# Patient Record
Sex: Female | Born: 1965 | Race: White | Hispanic: No | Marital: Married | Smoking: Current every day smoker
Health system: Southern US, Community
[De-identification: ages and names within clinical notes are randomized; demographics above are authoritative.]

## PROBLEM LIST (undated history)

## (undated) DIAGNOSIS — M549 Dorsalgia, unspecified: Secondary | ICD-10-CM

## (undated) DIAGNOSIS — T884XXA Failed or difficult intubation, initial encounter: Secondary | ICD-10-CM

## (undated) DIAGNOSIS — T82898A Other specified complication of vascular prosthetic devices, implants and grafts, initial encounter: Secondary | ICD-10-CM

## (undated) DIAGNOSIS — G8929 Other chronic pain: Secondary | ICD-10-CM

## (undated) DIAGNOSIS — I739 Peripheral vascular disease, unspecified: Secondary | ICD-10-CM

## (undated) DIAGNOSIS — I2510 Atherosclerotic heart disease of native coronary artery without angina pectoris: Secondary | ICD-10-CM

## (undated) DIAGNOSIS — Z8489 Family history of other specified conditions: Secondary | ICD-10-CM

## (undated) DIAGNOSIS — I779 Disorder of arteries and arterioles, unspecified: Secondary | ICD-10-CM

## (undated) DIAGNOSIS — I480 Paroxysmal atrial fibrillation: Secondary | ICD-10-CM

## (undated) DIAGNOSIS — M1990 Unspecified osteoarthritis, unspecified site: Secondary | ICD-10-CM

## (undated) DIAGNOSIS — E785 Hyperlipidemia, unspecified: Secondary | ICD-10-CM

## (undated) DIAGNOSIS — R9439 Abnormal result of other cardiovascular function study: Secondary | ICD-10-CM

## (undated) DIAGNOSIS — E1140 Type 2 diabetes mellitus with diabetic neuropathy, unspecified: Secondary | ICD-10-CM

## (undated) DIAGNOSIS — F32A Depression, unspecified: Secondary | ICD-10-CM

## (undated) DIAGNOSIS — IMO0002 Reserved for concepts with insufficient information to code with codable children: Secondary | ICD-10-CM

## (undated) DIAGNOSIS — I214 Non-ST elevation (NSTEMI) myocardial infarction: Secondary | ICD-10-CM

## (undated) DIAGNOSIS — Z9289 Personal history of other medical treatment: Secondary | ICD-10-CM

## (undated) DIAGNOSIS — I255 Ischemic cardiomyopathy: Secondary | ICD-10-CM

## (undated) DIAGNOSIS — K219 Gastro-esophageal reflux disease without esophagitis: Secondary | ICD-10-CM

## (undated) DIAGNOSIS — F329 Major depressive disorder, single episode, unspecified: Secondary | ICD-10-CM

## (undated) DIAGNOSIS — M869 Osteomyelitis, unspecified: Secondary | ICD-10-CM

## (undated) DIAGNOSIS — N289 Disorder of kidney and ureter, unspecified: Secondary | ICD-10-CM

## (undated) HISTORY — DX: Other chronic pain: G89.29

## (undated) HISTORY — DX: Unspecified osteoarthritis, unspecified site: M19.90

## (undated) HISTORY — PX: TONSILLECTOMY: SUR1361

## (undated) HISTORY — DX: Peripheral vascular disease, unspecified: I73.9

## (undated) HISTORY — PX: CHOLECYSTECTOMY: SHX55

## (undated) HISTORY — DX: Non-ST elevation (NSTEMI) myocardial infarction: I21.4

## (undated) HISTORY — PX: CYSTECTOMY: SUR359

## (undated) HISTORY — DX: Ischemic cardiomyopathy: I25.5

## (undated) HISTORY — PX: CARDIAC CATHETERIZATION: SHX172

## (undated) HISTORY — DX: Disorder of arteries and arterioles, unspecified: I77.9

## (undated) HISTORY — DX: Reserved for concepts with insufficient information to code with codable children: IMO0002

## (undated) HISTORY — DX: Dorsalgia, unspecified: M54.9

## (undated) HISTORY — DX: Osteomyelitis, unspecified: M86.9

## (undated) HISTORY — DX: Major depressive disorder, single episode, unspecified: F32.9

## (undated) HISTORY — DX: Depression, unspecified: F32.A

## (undated) HISTORY — PX: SPINE SURGERY: SHX786

## (undated) HISTORY — PX: OTHER SURGICAL HISTORY: SHX169

## (undated) HISTORY — DX: Hyperlipidemia, unspecified: E78.5

## (undated) HISTORY — PX: BACK SURGERY: SHX140

---

## 2001-03-23 ENCOUNTER — Encounter: Payer: Self-pay | Admitting: Family Medicine

## 2001-03-23 ENCOUNTER — Ambulatory Visit (HOSPITAL_COMMUNITY): Admission: RE | Admit: 2001-03-23 | Discharge: 2001-03-23 | Payer: Self-pay | Admitting: Family Medicine

## 2003-07-06 ENCOUNTER — Encounter: Payer: Self-pay | Admitting: Orthopedic Surgery

## 2003-07-06 ENCOUNTER — Ambulatory Visit: Admission: RE | Admit: 2003-07-06 | Discharge: 2003-07-06 | Payer: Self-pay | Admitting: Orthopedic Surgery

## 2003-07-14 ENCOUNTER — Encounter: Payer: Self-pay | Admitting: Orthopedic Surgery

## 2003-07-14 ENCOUNTER — Ambulatory Visit (HOSPITAL_COMMUNITY): Admission: RE | Admit: 2003-07-14 | Discharge: 2003-07-14 | Payer: Self-pay | Admitting: Orthopedic Surgery

## 2003-09-14 ENCOUNTER — Ambulatory Visit (HOSPITAL_COMMUNITY): Admission: RE | Admit: 2003-09-14 | Discharge: 2003-09-14 | Payer: Self-pay | Admitting: Internal Medicine

## 2004-05-22 ENCOUNTER — Ambulatory Visit (HOSPITAL_COMMUNITY): Admission: RE | Admit: 2004-05-22 | Discharge: 2004-05-22 | Payer: Self-pay | Admitting: Internal Medicine

## 2004-07-25 ENCOUNTER — Encounter (HOSPITAL_COMMUNITY): Admission: RE | Admit: 2004-07-25 | Discharge: 2004-08-17 | Payer: Self-pay | Admitting: Neurosurgery

## 2004-08-20 ENCOUNTER — Encounter (HOSPITAL_COMMUNITY): Admission: RE | Admit: 2004-08-20 | Discharge: 2004-09-19 | Payer: Self-pay | Admitting: General Surgery

## 2004-09-20 ENCOUNTER — Inpatient Hospital Stay (HOSPITAL_COMMUNITY): Admission: RE | Admit: 2004-09-20 | Discharge: 2004-09-23 | Payer: Self-pay | Admitting: Neurosurgery

## 2004-10-02 ENCOUNTER — Ambulatory Visit (HOSPITAL_COMMUNITY): Admission: RE | Admit: 2004-10-02 | Discharge: 2004-10-02 | Payer: Self-pay | Admitting: Neurosurgery

## 2004-11-19 ENCOUNTER — Ambulatory Visit: Payer: Self-pay | Admitting: Orthopedic Surgery

## 2004-11-26 ENCOUNTER — Ambulatory Visit: Payer: Self-pay | Admitting: Orthopedic Surgery

## 2004-11-30 ENCOUNTER — Ambulatory Visit (HOSPITAL_COMMUNITY): Admission: RE | Admit: 2004-11-30 | Discharge: 2004-11-30 | Payer: Self-pay | Admitting: Orthopedic Surgery

## 2004-12-06 ENCOUNTER — Ambulatory Visit: Payer: Self-pay | Admitting: Orthopedic Surgery

## 2004-12-11 ENCOUNTER — Ambulatory Visit (HOSPITAL_COMMUNITY): Admission: RE | Admit: 2004-12-11 | Discharge: 2004-12-11 | Payer: Self-pay | Admitting: Orthopedic Surgery

## 2005-01-25 ENCOUNTER — Ambulatory Visit (HOSPITAL_COMMUNITY): Admission: RE | Admit: 2005-01-25 | Discharge: 2005-01-25 | Payer: Self-pay | Admitting: Neurosurgery

## 2005-02-04 ENCOUNTER — Ambulatory Visit: Payer: Self-pay | Admitting: Orthopedic Surgery

## 2005-02-15 ENCOUNTER — Ambulatory Visit (HOSPITAL_COMMUNITY): Admission: RE | Admit: 2005-02-15 | Discharge: 2005-02-15 | Payer: Self-pay | Admitting: Orthopedic Surgery

## 2005-02-15 ENCOUNTER — Ambulatory Visit: Payer: Self-pay | Admitting: Orthopedic Surgery

## 2005-02-18 ENCOUNTER — Ambulatory Visit: Payer: Self-pay | Admitting: Orthopedic Surgery

## 2005-03-06 ENCOUNTER — Ambulatory Visit: Payer: Self-pay | Admitting: Orthopedic Surgery

## 2005-03-20 ENCOUNTER — Ambulatory Visit: Payer: Self-pay | Admitting: Orthopedic Surgery

## 2005-05-14 ENCOUNTER — Other Ambulatory Visit: Admission: RE | Admit: 2005-05-14 | Discharge: 2005-05-14 | Payer: Self-pay | Admitting: *Deleted

## 2005-05-27 ENCOUNTER — Ambulatory Visit (HOSPITAL_COMMUNITY): Admission: RE | Admit: 2005-05-27 | Discharge: 2005-05-27 | Payer: Self-pay | Admitting: *Deleted

## 2005-06-24 ENCOUNTER — Emergency Department (HOSPITAL_COMMUNITY): Admission: EM | Admit: 2005-06-24 | Discharge: 2005-06-24 | Payer: Self-pay | Admitting: Emergency Medicine

## 2007-01-07 ENCOUNTER — Emergency Department (HOSPITAL_COMMUNITY): Admission: EM | Admit: 2007-01-07 | Discharge: 2007-01-07 | Payer: Self-pay | Admitting: Emergency Medicine

## 2007-09-14 ENCOUNTER — Ambulatory Visit (HOSPITAL_COMMUNITY): Admission: RE | Admit: 2007-09-14 | Discharge: 2007-09-14 | Payer: Self-pay | Admitting: Internal Medicine

## 2007-09-15 ENCOUNTER — Ambulatory Visit (HOSPITAL_COMMUNITY): Admission: RE | Admit: 2007-09-15 | Discharge: 2007-09-15 | Payer: Self-pay | Admitting: Internal Medicine

## 2007-12-08 ENCOUNTER — Encounter (INDEPENDENT_AMBULATORY_CARE_PROVIDER_SITE_OTHER): Payer: Self-pay | Admitting: Orthopedic Surgery

## 2007-12-08 ENCOUNTER — Inpatient Hospital Stay (HOSPITAL_COMMUNITY): Admission: RE | Admit: 2007-12-08 | Discharge: 2007-12-11 | Payer: Self-pay | Admitting: Orthopedic Surgery

## 2008-02-24 ENCOUNTER — Ambulatory Visit (HOSPITAL_COMMUNITY): Admission: RE | Admit: 2008-02-24 | Discharge: 2008-02-24 | Payer: Self-pay | Admitting: Internal Medicine

## 2008-03-01 ENCOUNTER — Ambulatory Visit (HOSPITAL_COMMUNITY): Admission: RE | Admit: 2008-03-01 | Discharge: 2008-03-01 | Payer: Self-pay | Admitting: Internal Medicine

## 2009-01-06 ENCOUNTER — Ambulatory Visit (HOSPITAL_COMMUNITY): Admission: RE | Admit: 2009-01-06 | Discharge: 2009-01-06 | Payer: Self-pay | Admitting: Internal Medicine

## 2009-01-06 ENCOUNTER — Encounter: Payer: Self-pay | Admitting: Orthopedic Surgery

## 2009-02-27 ENCOUNTER — Ambulatory Visit: Payer: Self-pay | Admitting: Orthopedic Surgery

## 2009-02-27 DIAGNOSIS — M7150 Other bursitis, not elsewhere classified, unspecified site: Secondary | ICD-10-CM

## 2009-02-27 DIAGNOSIS — M7500 Adhesive capsulitis of unspecified shoulder: Secondary | ICD-10-CM | POA: Insufficient documentation

## 2009-03-01 ENCOUNTER — Encounter: Payer: Self-pay | Admitting: Orthopedic Surgery

## 2009-03-22 ENCOUNTER — Encounter (HOSPITAL_COMMUNITY): Admission: RE | Admit: 2009-03-22 | Discharge: 2009-04-21 | Payer: Self-pay | Admitting: Orthopedic Surgery

## 2009-04-03 ENCOUNTER — Ambulatory Visit: Payer: Self-pay | Admitting: Orthopedic Surgery

## 2009-04-13 ENCOUNTER — Encounter: Payer: Self-pay | Admitting: Orthopedic Surgery

## 2009-05-11 ENCOUNTER — Ambulatory Visit (HOSPITAL_COMMUNITY): Admission: RE | Admit: 2009-05-11 | Discharge: 2009-05-11 | Payer: Self-pay | Admitting: Internal Medicine

## 2010-04-23 ENCOUNTER — Ambulatory Visit: Payer: Self-pay | Admitting: Surgery

## 2010-05-10 ENCOUNTER — Ambulatory Visit (HOSPITAL_COMMUNITY): Admission: RE | Admit: 2010-05-10 | Discharge: 2010-05-10 | Payer: Self-pay | Admitting: Surgery

## 2010-05-10 ENCOUNTER — Ambulatory Visit: Payer: Self-pay | Admitting: Surgery

## 2010-06-11 ENCOUNTER — Ambulatory Visit: Payer: Self-pay | Admitting: Surgery

## 2010-07-05 ENCOUNTER — Ambulatory Visit: Payer: Self-pay | Admitting: Surgery

## 2010-07-05 ENCOUNTER — Encounter: Payer: Self-pay | Admitting: Surgery

## 2010-07-05 ENCOUNTER — Inpatient Hospital Stay (HOSPITAL_COMMUNITY): Admission: RE | Admit: 2010-07-05 | Discharge: 2010-07-08 | Payer: Self-pay | Admitting: Surgery

## 2010-07-05 HISTORY — PX: OTHER SURGICAL HISTORY: SHX169

## 2010-07-30 ENCOUNTER — Ambulatory Visit: Payer: Self-pay | Admitting: Surgery

## 2010-10-16 ENCOUNTER — Ambulatory Visit: Payer: Self-pay | Admitting: Surgery

## 2010-11-05 ENCOUNTER — Ambulatory Visit: Payer: Self-pay | Admitting: Vascular Surgery

## 2010-11-18 HISTORY — PX: SKIN GRAFT: SHX250

## 2010-12-09 ENCOUNTER — Encounter: Payer: Self-pay | Admitting: Internal Medicine

## 2010-12-18 ENCOUNTER — Ambulatory Visit: Payer: Self-pay | Admitting: Surgery

## 2010-12-21 NOTE — Procedures (Unsigned)
BYPASS GRAFT EVALUATION  INDICATION:  Followup right femoral popliteal bypass graft.  HISTORY: Diabetes:  Yes. Cardiac:  No. Hypertension:  No. Smoking:  Yes. Previous Surgery:  Right common femoral artery to below knee popliteal bypass graft with saphenous vein done 07/05/2010.  SINGLE LEVEL ARTERIAL EXAM                              RIGHT              LEFT Brachial:                    110                80 Anterior tibial:             103                61 Posterior tibial:            89                 59 Peroneal: Ankle/brachial index:        0.94               0.55  PREVIOUS ABI:  Date:  07/30/2010  RIGHT:  0.74  LEFT:  0.51  LOWER EXTREMITY BYPASS GRAFT DUPLEX EXAM:  DUPLEX:  Patent right femoral-popliteal bypass graft with mildly broadened biphasic and monophasic waveforms.  There was no evidence of increased velocities visualized.  IMPRESSION:  Stable ankle brachial indices.  A patent femoral-popliteal bypass graft as described above.  There is a pressure gradient difference within the brachial arteries, the right being greater than the left.        ___________________________________________ Judeth Cornfield. Scot Dock, M.D.  OD/MEDQ  D:  12/14/2010  T:  12/14/2010  Job:  [id number]

## 2011-01-31 LAB — COMPREHENSIVE METABOLIC PANEL
ALT: 27 U/L (ref 0–35)
Alkaline Phosphatase: 116 U/L (ref 39–117)
BUN: 9 mg/dL (ref 6–23)
CO2: 30 mEq/L (ref 19–32)
Chloride: 95 mEq/L — ABNORMAL LOW (ref 96–112)
GFR calc non Af Amer: >60 mL/min (ref >60)
Glucose, Bld: 328 mg/dL — ABNORMAL HIGH (ref 70–99)
Potassium: 3.8 mEq/L (ref 3.5–5.1)
Sodium: 135 mEq/L (ref 135–145)
Total Bilirubin: 0.3 mg/dL (ref 0.3–1.2)

## 2011-01-31 LAB — GLUCOSE, CAPILLARY
Glucose-Capillary: 148 mg/dL — ABNORMAL HIGH (ref 70–99)
Glucose-Capillary: 170 mg/dL — ABNORMAL HIGH (ref 70–99)
Glucose-Capillary: 239 mg/dL — ABNORMAL HIGH (ref 70–99)
Glucose-Capillary: 272 mg/dL — ABNORMAL HIGH (ref 70–99)
Glucose-Capillary: 275 mg/dL — ABNORMAL HIGH (ref 70–99)
Glucose-Capillary: 277 mg/dL — ABNORMAL HIGH (ref 70–99)

## 2011-01-31 LAB — CBC
HCT: 51.8 % — ABNORMAL HIGH (ref 36.0–46.0)
Hemoglobin: 18 g/dL — ABNORMAL HIGH (ref 12.0–15.0)
MCHC: 34.6 g/dL (ref 30.0–36.0)
MCV: 92.3 fL (ref 78.0–100.0)
Platelets: 187 10*3/uL (ref 150–400)
RDW: 13.9 % (ref 11.5–15.5)
RDW: 14 % (ref 11.5–15.5)
WBC: 9.6 10*3/uL (ref 4.0–10.5)

## 2011-01-31 LAB — SURGICAL PCR SCREEN
MRSA, PCR: NEGATIVE
Staphylococcus aureus: NEGATIVE

## 2011-01-31 LAB — PROTIME-INR
INR: 0.94 (ref 0.00–1.49)
Prothrombin Time: 12.8 seconds (ref 11.6–15.2)

## 2011-01-31 LAB — URINALYSIS, ROUTINE W REFLEX MICROSCOPIC
Bilirubin Urine: NEGATIVE
Glucose, UA: >1000 mg/dL — AB
Hgb urine dipstick: NEGATIVE
Protein, ur: NEGATIVE mg/dL
Urobilinogen, UA: 0.2 mg/dL (ref 0.0–1.0)

## 2011-01-31 LAB — BASIC METABOLIC PANEL
BUN: 3 mg/dL — ABNORMAL LOW (ref 6–23)
CO2: 32 mEq/L (ref 19–32)
Calcium: 8.5 mg/dL (ref 8.4–10.5)
Calcium: 8.7 mg/dL (ref 8.4–10.5)
Creatinine, Ser: 0.45 mg/dL (ref 0.4–1.2)
Creatinine, Ser: 0.54 mg/dL (ref 0.4–1.2)
GFR calc Af Amer: >60 mL/min (ref >60)
GFR calc non Af Amer: >60 mL/min (ref >60)
Glucose, Bld: 283 mg/dL — ABNORMAL HIGH (ref 70–99)

## 2011-01-31 LAB — URINE MICROSCOPIC-ADD ON

## 2011-01-31 LAB — TYPE AND SCREEN

## 2011-02-03 LAB — POCT I-STAT, CHEM 8
BUN: 13 mg/dL (ref 6–23)
Calcium, Ion: 1.04 mmol/L — ABNORMAL LOW (ref 1.12–1.32)
TCO2: 27 mmol/L (ref 0–100)

## 2011-02-03 LAB — GLUCOSE, CAPILLARY: Glucose-Capillary: 217 mg/dL — ABNORMAL HIGH (ref 70–99)

## 2011-02-14 ENCOUNTER — Emergency Department (HOSPITAL_COMMUNITY): Payer: Medicare Other

## 2011-02-14 ENCOUNTER — Encounter (HOSPITAL_COMMUNITY): Payer: Self-pay | Admitting: Radiology

## 2011-02-14 ENCOUNTER — Emergency Department (HOSPITAL_COMMUNITY)
Admission: EM | Admit: 2011-02-14 | Discharge: 2011-02-14 | Disposition: A | Discharge status: Home / Self Care | Payer: Medicare Other | Attending: Emergency Medicine | Admitting: Emergency Medicine

## 2011-02-14 DIAGNOSIS — J3489 Other specified disorders of nose and nasal sinuses: Secondary | ICD-10-CM | POA: Insufficient documentation

## 2011-02-14 DIAGNOSIS — R42 Dizziness and giddiness: Secondary | ICD-10-CM | POA: Insufficient documentation

## 2011-02-25 LAB — CREATININE, SERUM
Creatinine, Ser: 0.59 mg/dL (ref 0.4–1.2)
GFR calc non Af Amer: >60 mL/min (ref >60)

## 2011-04-02 NOTE — Assessment & Plan Note (Signed)
OFFICE VISIT   [name], [name]  DOB:  August 25, 1966                                       07/30/2010  CHART#:[id number]   [name] returns today.  She is status post right femoral to below knee  popliteal bypass graft with reversed ipsilateral vein.  She had slight  separation of one of the vein harvest sites which she has placed a  butterfly dressing on, otherwise she is doing well.  She has some  swelling.  The leg pain is adequately controlled.   On physical exam, her incisions are all healed with the exception of one  of her vein harvest sites, which she just has an eschar over it.  There  is no evidence of erythema.  There is no evidence of infection.  There  is mild lymphatic drainage from the apex of it but there is no skin  opening.  There is a mild amount of edema in the foot.   Her ABI has increased on the right to now 0.74.  Preoperatively it was  0.5.   PLAN:  I will see the patient back in 3 months with a repeat ultrasound  to scan entire graft.  In the interim she has any problems she knows to  contact me.     Eldridge Abrahams, MD  Electronically Signed   VWB/MEDQ  D:  07/30/2010  T:  07/31/2010  Job:  [id number]

## 2011-04-02 NOTE — H&P (Signed)
NAMEKEIDRA, DOOSE                ACCOUNT NO.:  [id number]   MEDICAL RECORD NO.:  [id number]          PATIENT TYPE:  INP   LOCATION:  NA                           FACILITY:  [facility]   PHYSICIAN:  [name], P.A.  DATE OF BIRTH:  1965/12/02   DATE OF ADMISSION:  DATE OF DISCHARGE:                              HISTORY & PHYSICAL   CHIEF COMPLAINT:  Lumbar pain status post fusion at L4-5 with left leg  pain.   HISTORY OF PRESENT ILLNESS:  Diabetes, asthma, arthritis.   ALLERGIES:  PENICILLIN.   MEDICATIONS:  Albuterol, glipizide, hydrocodone, diazepam, Lyrica.   PAST SURGICAL HISTORY:  1. A right toe surgery.  2. Lumbar fusion at L4-5 with inner body fusion.  3. Left wrist surgery.   FAMILY HISTORY:  Mother has a history of hypertension, coronary artery  disease, diabetes, gout and mental illness.   REVIEW OF SYSTEMS:  The patient reports no fever, chills.  No weight  loss.  No bowel or bladder incontinence.  No hemoptysis.  No seizures.  No headaches.  No chronic cough.  No shortness of breath.   PHYSICAL EXAMINATION:  VITAL SIGNS:  Temperature today is 98.7, pulse  90, respirations 18, blood pressure 125/83, saturation 96% on room air.  HEENT:  Pupils are round, equal, reactive to light.  NECK:  Supple.  No adenopathy.  CHEST:  Clear to auscultation.  No wheezing.  HEART:  Regular rate and rhythm.  No murmur.  ABDOMEN:  Soft, nontender to palpation.  Positive bowel sounds on  auscultation.  EXTREMITIES:  No focal weaknesses noted.  She has hypersensitivity down  the left leg and all dermatomes compared to the right distally.  NEUROLOGICAL:  Motor intact.  She has tenderness to palpation at L5-S1  posterior lumbar.  SKIN:  Clean, dry and intact.  No rashes.  No __________  in the skin.   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   PLAN:  Lumbar fusion L5-S1, hardware removal of existing hardware.  New  pedicle screws and rods will be placed  from L4-S1 with inner body fusion  at L5-S1 by Dr. Dimas Alexandria.      [name], P.A.     MC/MEDQ  D:  12/02/2007  T:  12/02/2007  Job:  [id number]

## 2011-04-02 NOTE — Op Note (Signed)
[name], [name]                ACCOUNT NO.:  [id number]   MEDICAL RECORD NO.:  [id number]          PATIENT TYPE:  INP   LOCATION:  5004                         FACILITY:  [facility]   PHYSICIAN:  Dimas Alexandria, MD      DATE OF BIRTH:  1966/01/13   DATE OF PROCEDURE:  12/08/2007  DATE OF DISCHARGE:                               OPERATIVE REPORT   SURGEON:  Dr. Dimas Alexandria.   ASSISTANT:  [name], PA-C.   PREOPERATIVE DIAGNOSIS:  Central disk herniation L5-S1 status post  previous L4-5 posterior interbody fusion.   POSTOPERATIVE DIAGNOSIS:  Central disk herniation L5-S1 status post  previous L4-5 posterior interbody fusion.   OPERATIVE PROCEDURE:  Removal of previously placed pedicle screws at L4-  5; posterolateral and posterior interbody fusion (TLIF) L5-S1; harvest  of posterior iliac crest graft right side; insertion of bilateral S1  pedicle screws and reinsertion of bilateral L5 pedicle screws; insertion  of interbody cage L5-S1 through the left transforaminal approach.   OPERATIVE NOTE:  The patient was placed under general endotracheal  anesthesia.  Prophylactic antibiotics had been infused.  A Foley  catheter was placed in the bladder.  Sequential compression devices were  placed on both lower extremities.   The patient was positioned prone on a Jackson frame.  Care was taken to  position the upper extremities so as to avoid hyperflexion and abduction  of the shoulders.  Both upper extremities were supported on foam from  axilla to hands.  Care was taken to avoid hyperflexion of the elbows.  There was no pressure on the cubital tunnels.  The hips were mildly  flexed and the thighs, knees, shins and ankles supported on pillows.   The previous midline incision was marked with a skin marker and a  proposed site of bone graft harvest was marked on the right side with a  skin marker just lateral to the iliac crest.  The lumbar area was  prepped with DuraPrep and  draped in a rectangular fashion.  The drapes  were secured with Ioban.   The skin was scored creating a small ellipse the full length of the  previous midline incision.  The subcutaneous tissue was injected with a  mixture of 0.25% plain Marcaine and 1% lidocaine with epinephrine.  The  scar was then excised using cutting current.  Dissection was carried  down onto what proved to be the spinous process of L5 and more  proximally L3.  The paraspinal muscle and scar was mobilized bilaterally  to reveal the pedicle screws and rods in place at L4-5.  The set screws  were removed from the saddles, the rods removed and then the screws  removed without difficulty.   I next exposed the ala of the sacrum on the left and the transverse  process of L5 on the left.  I then did an inferior facetectomy on the  left of L5.  We then developed a plane between the deep surface of the  superior tracheal process and the ligamentum flavum and joint capsule  using an angled curette.  The  superior articular facet was then removed  with a Kerrison rongeur.  The epidural veins and the neural foramen were  bipolar coagulated.  The bipolar coagulated tissue was then mobilized to  reveal the disk deep to it and mobilization carried out medially to the  origins of the S1 nerve root and dura.  We then placed a oblong  cottonoid superior to the L5-S1 disk, interposed between it and the  exiting L5 nerve root on the left side.   Next we created a pedicle hole at the S1 while directly visualizing and  palpating the S1 pedicle.  The pedicle hole was started with a high-  speed bur, an awl used to further perforate the pedicle and a 3.5-mm  drill bit used to create a hole through the pedicle into the body of S1.  The hole was carefully palpated with a ball-tip probe and was  circumferentially intact and it was sounded for depth.  A 7.5 mm screw  of appropriate length was then chosen.  The hole was tapped with a 6.5  mm  tap which was stimulated and the current necessary to elicit distal  EMG activity was above the critical level, in a safe the zone.  The  screw was then inserted into the S1 pedicle on the left side.  We then  drilled the bottom out of the L5 hole, measured it and placed another  7.5 mm screw in this hole, which had previously contained a 6.5 mm  screw.  This screw was also stimulated as was the S1 screw and the  current necessary to elicit distal EMG activity was in a safe zone.   We then placed a working rod on the left at L5-S1 and distracted the  construct and held  it with set screws.  This created improved ability  to work within the L5-S1 disk space through the transforaminal approach.  We incised the disk and removed a piece of annulus.  Subsequently we  curetted the disk with multiple different curettes and removed a large  quantity of degenerate looking nucleus with pituitary rongeurs. The  endplates were vigorously curetted.   Next we harvested a bone graft from the right posterior iliac crest.  A  slightly oblique incision was made lateral to the crest, just proximal  to the PSIS. The gluteal fascia was incised along its origin from the  iliac crest and the muscle elevated off the ilium.  I then used gouges  to harvest a moderately large quantity of cortical cancellous graft and  cancellous graft.  We then irrigated the wound and packed the bony  defect with Gelfoam.  The gluteal fascia was reapposed to the iliac  crest using interrupted figure-of-eight #1 Vicryl sutures.   The graft was then morselized.   We also at this point placed a pedicle screw at S1 on the right side,  identifying the base of the superior articular process by removing a  small portion of the inferior articular process of L5 on the right. The  hole was perforated with an awl and a 3.5 mm drill bit used to make a  hole through the pedicle into the vertebral body. We then tapped the  hole with a 7.5 mm  tap. The tap was stimulated and the stimulation of  distal EMG activity occurred above the critical level, meaning that is  highly unlikely there is any contact between the metal and the nerve  root. The bottom was drilled from the L5 hole  on the right side and 7.5  mm screw of appropriate length inserted at L5.   We then returned our attention to the left side.  An intradiskal  distractor was placed, the set screws of the working rod loosened and  further distraction obtained and the set screws tightened.   A special funnel was placed in the disk space and a moderate quantity of  morcellized graft impacted into the anterior portion of disk space.   An 11-mm kidney-shaped implant was then loaded with bone graft and  impacted into the disk space in good position transversely and somewhat  anteriorly. There was no spontaneous L5 nerve root firing on the left  side during the impaction of this implant.  We then released the facet  screws to allow compression of the disk space.   Next, I the used a high-speed bur to decorticate the ala of the sacrum  and transverse process of L5 on the left.  A moderately large quantity  of morcellized graft was then packed in posterolaterally on the left.   Next a new rod was placed on the left, another new rod on the right and  the cold construct compressed bilaterally and the set screws torqued.  At this point the cross-table lateral radiograph was taken which was  consistent with satisfactory screw position.   I then used a high-speed bur to remove the articular surfaces of the  right L5-S1 apophyseal joint and to decorticate the lamina of L5 and the  lamina S1 on the right side.  The remaining bone graft was packed in  posteriorly in the facet joint as well as between the lamina of S1 and  L5 on the right.   We then inspected the laminectomy defect for the presence of any graft  and none had migrated into the wound.  FloSeal was injected into  the  laminectomy defect.   A 15 gauge Blake drain was placed subfascially.  The thoracolumbar  fascia was closed using continuous #1 Vicryl suture.  A one-eighth inch  Hemovac drain was placed in the subcutaneous layer.  The subcutaneous  layer was closed using interrupted and continuous 2-0 Vicryl suture over  a one-eighth inch Hemovac drain which was brought out through the bone  graft harvest subcutaneous layer and through the skin to the right and  proximally.  A similar closure of the subcutaneous layer of the bone  graft harvest site was carried out.  Both skin incisions were closed  using a continuous subcuticular 3-0 undyed Vicryl. The skin edges were  reinforced with Steri-Strips.  Both drains were secured with a basket  weave type of #2-0 nylon suture.  Antibiotic dressings were applied and  secured with OpSite.   The estimated blood loss was 200 mL.  There were no intraoperative  complications.  The sponge and needle counts were correct.      Dimas Alexandria, MD  Electronically Signed     MT/MEDQ  D:  12/09/2007  T:  12/09/2007  Job:  [id number]

## 2011-04-02 NOTE — Procedures (Signed)
VASCULAR LAB EXAM   INDICATION:  Preop for lower extremity vascular surgery.   HISTORY:  Diabetes:  Yes.  Cardiac:  No.  Hypertension:  Yes.   EXAM:  Bilateral lower extremity vein mapping.   IMPRESSION:  1. The right greater saphenous vein is compressible with diameter      measurements ranging from 0.23-0.54 cm.  2. The right lesser saphenous vein is compressible with diameter      measurements ranging from 0.16-0.33 cm.  3. The left greater saphenous vein is compressible with diameter      measurements ranging from 0.23-0.58 cm.  4. The left lesser saphenous vein is compressible with diameter      measurements ranging from 0.26-0.28 cm.  5. Additional diameter measurements are noted on the attached      worksheet.      ___________________________________________  V. Leia Alf, MD   CH/MEDQ  D:  06/12/2010  T:  06/12/2010  Job:  [id number]

## 2011-04-02 NOTE — Procedures (Signed)
CAROTID DUPLEX EXAM   INDICATION:  Preoperative for lower extremity vascular surgery.   HISTORY:  Diabetes:  yes  Cardiac:  no  Hypertension:  yes  Smoking:  yes  Previous Surgery:  no  CV History:  Currently asymptomatic  Amaurosis Fugax No, Paresthesias No, Hemiparesis No                                       RIGHT             LEFT  Brachial systolic pressure:         120               85  Brachial Doppler waveforms:         normal            Abnormal  Vertebral direction of flow:        Antegrade         Bidirectional  DUPLEX VELOCITIES (cm/sec)  CCA peak systolic                   96                [id number]  ECA peak systolic                   70                [id number]  ICA peak systolic                   98                77  ICA end diastolic                   34                29  PLAQUE MORPHOLOGY:                  heterogeneous     heterogeneous  PLAQUE AMOUNT:                      mild              mild  PLAQUE LOCATION:                    ICA               ICA / CCA   IMPRESSION:  1. No hemodynamically significant stenosis of the bilateral internal      carotid arteries noted.  2. Bidirectional (mostly retrograde) flow noted in the left vertebral      artery along with asymmetric bilateral  brachial pressures.  3. Visualization of the left proximal subclavian artery was limited      however a dampened monophasic midsubclavian artery waveform is      noted.   ___________________________________________  V. Leia Alf, MD   CH/MEDQ  D:  06/12/2010  T:  06/12/2010  Job:  [id number]

## 2011-04-02 NOTE — Assessment & Plan Note (Signed)
OFFICE VISIT   [name], [name]  DOB:  16-Jul-1966                                       06/11/2010  CHART#:[id number]   REASON FOR VISIT:  Follow up leg pain.   HISTORY:  This is a 45 year old female that I saw initially at the  request of Dr. Gerarda Fraction for bilateral arterial insufficiency as well as a  chronic nonhealing wound on the right leg that had present for  approximately a year.  She went for arteriogram and was found have  bilateral SFA occlusion.  She comes back in today for further  discussions.   The patient continues to be treated medically for her  hypercholesterolemia.  She is an insulin-dependent diabetic.  She has  tried to quit smoking but has yet to stop up.   REVIEW OF SYSTEMS:  As above.  All other review systems are negative as  documented in the encounter form.   PAST MEDICAL HISTORY:  Hypercholesterolemia, diabetes, hypertension,  chronic back pain, asthma, peripheral vascular disease.   FAMILY HISTORY:  Positive for cardiovascular disease in her mother.  Her  mother had heart attack and also underwent a leg bypass.   SOCIAL HISTORY:  She is married.  She currently smokes a pack and half a  day.  No alcohol.   MEDICATIONS:  Lasix, insulin, metformin, glipizide, hydrocodone,  Robaxin, cholesterol medicine, antireflux medicine and Lyrica.   ALLERGIES:  Penicillin.   PHYSICAL EXAMINATION:  Heart rate 92, blood pressure 89/56, temperature  97.9.  GENERAL:  She is well-appearing, in no distress.  HEENT:  Within normal limits.  LUNGS:  Clear bilaterally.  No wheezes or rhonchi.  CARDIOVASCULAR:  Regular rate and rhythm.  ABDOMEN:  Soft, nontender.  MUSCULOSKELETAL:  No major deformity.  NEURO:  She has no focal deficits or weakness.  She has a 1 x 1 cm ulcer on the lateral aspect of her right foot.   Diagnostic studies:  She does have a normal myocardial perfusion imaging  performed by [facility].  ABIs are  0.64 on the right  and 0.59 on the  left and  0-49% stenosis in bilateral carotid arteries.   ASSESSMENT/PLAN:  Bilateral claudication with right leg ulcer.   PLAN:  The patient will be scheduled for a right femoral to above-knee  popliteal bypass graft to be done on Thursday August 18.  I vein mapped  her today.  She has adequate right greater saphenous vein.  All of her  questions were answered today.     Eldridge Abrahams, MD  Electronically Signed   VWB/MEDQ  D:  06/11/2010  T:  06/12/2010  Job:  [id number]

## 2011-04-02 NOTE — Assessment & Plan Note (Signed)
OFFICE VISIT   Jamie, [name]  DOB:  01/03/1966                                       04/23/2010  CHART#:[id number]   HISTORY:  This is a 45 year old female I am seeing at request of Dr.  Gerarda Fraction for evaluation of bilateral arterial insufficiency.  The patient  also has a chronic, nonhealing wound on her right leg that has been  present for greater than 1 year.  She has also developed a new ulcer on  her right heel that has just been present for several days.  She  continues to do dressing changes to her wounds.  The patient has been a  previous patient at [facility] and has been offered  peripheral stenting the patient.  The patient is no longer seen at their  practice.   The patient continues to be a smoker, although she has tried multiple  modalities to quit including Chantix and water vapor cigarettes and  nicotine gum.  She has high cholesterol, for which she is diet-  controlled.  She is a diabetic, on insulin.   REVIEW OF SYSTEMS:  GENERAL:  Positive loss of appetite.  CARDIAC:  Negative.  PULMONARY:  Positive for asthma.  GI:  Negative.  GU:  Negative.  VASCULAR:  Positive for pain in legs when walking and when lying flat.  NEURO:  Negative.  MUSCULOSKELETAL:  Positive for joint pain, muscle pain.  PSYCH:  Positive for depression.  EENT:  Negative.  HEME:  NEGATIVE.  SKIN:  Negative.   PAST MEDICAL HISTORY:  Hypercholesterolemia, diabetes, hypertension,  chronic back pain, asthma, peripheral vascular disease.   FAMILY HISTORY:  Positive for cardiovascular disease in her mother.   SOCIAL HISTORY:  She is married.  Currently smokes a pack to a pack and  a half a day.  No alcohol.   PHYSICAL EXAMINATION:  Vital signs:  Heart rate 95, blood pressure  100/67, respiratory rate 20.  General:  She is well-appearing, in no  distress.  HEENT:  Within normal limits.  Lungs were clear bilaterally  with bilateral wheezes.   Cardiovascular is a regular rate and rhythm.  Abdomen is obese but soft.  Musculoskeletal:  The patient has an ulcer  on the lateral malleolus, approximately 1, cm with drainage, no evidence  of infection.  There is also skin breakdown on the heel, and on the  plantar surface of the foot there is a large, fluid-filled blister.  Neuro:  She has no focal deficits or weaknesses.  Skin:  She has several  punctate lesions on her legs.   DIAGNOSTIC TESTS:  Ultrasound was performed today, which reveals an  ankle-brachial index of 0.5 on the right and 0.64 on the left.  She has  bilateral SFA occlusion.   ASSESSMENT:  Right leg ulcers.   PLAN:  I tried to reiterate to the patient today that she has a myriad  of complaints regarding her lower extremities and I do not feel that  they are all related to arterial insufficiency.  What I am most  concerned about is the ulcer on the right leg that has been there for  greater than a year and has not healed.  I think this is not healing due  to her vascular insufficiency.  The first step in management would be to  proceed with  an arteriogram focusing on the right leg.  I would also get  diagnostic images of the left leg.  If possible, I would proceed with  stenting of her right leg; however, with a known total occlusion she may  require a bypass surgery for revascularization.  I have scheduled her  arteriogram for Thursday, June, 23rd.     Eldridge Abrahams, MD  Electronically Signed   VWB/MEDQ  D:  04/23/2010  T:  04/24/2010  Job:  [id number]   cc:   Dr. Gerarda Fraction

## 2011-04-02 NOTE — Procedures (Signed)
LOWER EXTREMITY ARTERIAL DUPLEX   INDICATION:  PVD.   HISTORY:  Diabetes:  Yes.  Cardiac:  No.  Hypertension:  Yes.  Smoking:  Yes.  Previous Surgery:  No.   SINGLE LEVEL ARTERIAL EXAM                          RIGHT                LEFT  Brachial:               118                  76  Anterior tibial:        59                   76  Posterior tibial:       42                   73  Peroneal:  Ankle/Brachial Index:   0.50                 0.64   LOWER EXTREMITY ARTERIAL DUPLEX EXAM   DUPLEX:  1. Right proximal to mid SFA indicates high velocities of 265 cm/s      with occlusion of the mid SFA.  2. The left SFA is occluded from the proximal to mid artery.   IMPRESSION:  1. The right proximal to mid superficial femoral artery shows      increased velocities of 265 cm with occlusion of mid superficial      femoral artery.  2. Left occlusion of proximal and mid superficial femoral artery.   ___________________________________________  V. Leia Alf, MD   NT/MEDQ  D:  04/23/2010  T:  04/23/2010  Job:  [id number]

## 2011-04-05 ENCOUNTER — Encounter (INDEPENDENT_AMBULATORY_CARE_PROVIDER_SITE_OTHER): Payer: Medicare Other

## 2011-04-05 DIAGNOSIS — I739 Peripheral vascular disease, unspecified: Secondary | ICD-10-CM

## 2011-04-05 DIAGNOSIS — Z48812 Encounter for surgical aftercare following surgery on the circulatory system: Secondary | ICD-10-CM

## 2011-04-05 NOTE — Discharge Summary (Signed)
NAMESHERYE, JANIS                ACCOUNT NO.:  [id number]   MEDICAL RECORD NO.:  [id number]          PATIENT TYPE:  INP   LOCATION:  3004                         FACILITY:  [facility]   PHYSICIAN:  Marchia Meiers. Vertell Limber, M.D.  DATE OF BIRTH:  06-13-1966   DATE OF ADMISSION:  09/20/2004  DATE OF DISCHARGE:  09/23/2004                                 DISCHARGE SUMMARY   REASON FOR ADMISSION:  Lumbar spondylosis with stenosis L4-5 level.   HISTORY OF PRESENT ILLNESS:  Phil Macmillan is a 45 year old woman with back  and leg pain who failed medical management, had a lumbar MRI demonstrating  grade 1 spondylolisthesis and spinal stenosis at the L4-L5 level. She is  admitted to the hospital on same day as procedure basis on September 20, 2004  and underwent decompression and fusion at the affected level.  Postoperatively, she was slow to mobilize but did well and was doing better  on November 6, at which point she was discharged to home in stable and  satisfactory condition having tolerated her operation and hospitalization  well.   DISCHARGE MEDICATIONS:  Percocet and Valium along with Nystatin for oral  thrush.   FINAL DIAGNOSIS:  Lumbar spondylosis with stenosis L4-5 level.   CONDITION ON DISCHARGE:  Improved.      Jose   JDS/MEDQ  D:  09/23/2004  T:  09/23/2004  Job:  [id number]

## 2011-04-05 NOTE — Op Note (Signed)
NAMEMELONIE, WENZLER                ACCOUNT NO.:  [id number]   MEDICAL RECORD NO.:  [id number]          PATIENT TYPE:  AMB   LOCATION:  DAY                           FACILITY:  [facility]   PHYSICIAN:  Carole Civil, M.D.DATE OF BIRTH:  1966-08-11   DATE OF PROCEDURE:  02/15/2005  DATE OF DISCHARGE:                                 OPERATIVE REPORT   HISTORY:  This is a 45 year old female who had a ganglion cyst removed from  her right foot. It recurred.  We aspirated it, got an MRI to rule out  infection. There was no infection, but recurrence of a ganglion cyst. She  presented for removal because of pain and difficulty with shoe wear.   PREOPERATIVE DIAGNOSIS:  Mass, right foot.   POSTOPERATIVE DIAGNOSIS:  Mass ganglion cyst, right foot.   FINDINGS:  Ganglion cyst and chronic granulation tissue, right foot.   PROCEDURE:  Excision of the mass.   SURGEON:  Carole Civil, M.D.   ANESTHETIC:  MAC.   SPECIMEN:  Granulation tissue and ganglion cyst wall.   BLOOD LOSS:  0.   COMPLICATIONS:  None.   COUNTS:  Correct.   The patient went to recovery room in stable condition.   The patient was identified as Maekayla stone in the preop holding area. Her  right foot was marked for surgery, countersigned by the surgeon. The history  and physical was updated and she was given preop antibiotics. She was taken  to the operating room where IV sedation was given along with the local 10 cc  injection of 1% plain lidocaine.  Through the previous incision, the  ganglion was removed.  The chronic granulation tissue was removed.  The  periosteum over the bone was scraped to remove any residual tissue. The  wound was irrigated and closed with  interrupted 3-0 nylon suture. Sterile bandage was applied. The patient was  taken to the recovery room in stable condition.   ADDENDUM:  Prior to dressing the wound another 10 cc of 1% lidocaine was  injected.      SEH/MEDQ  D:  02/15/2005  T:   02/15/2005  Job:  [id number]

## 2011-04-05 NOTE — Op Note (Signed)
[name], [name]                ACCOUNT NO.:  [id number]   MEDICAL RECORD NO.:  [id number]          PATIENT TYPE:  INP   LOCATION:  3004                         FACILITY:  [facility]   PHYSICIAN:  Ophelia Charter, M.D.DATE OF BIRTH:  [birth year]   DATE OF PROCEDURE:  09/20/2004  DATE OF DISCHARGE:                                 OPERATIVE REPORT   BRIEF HISTORY:  The patient is a 45 year old white female who has suffered  from back and leg pain.  She failed medical management and was worked up  with a lumbar MRI which demonstrated the patient had a grade I necrotic  spondylolisthesis with resultant spinal stenosis.  She has failed  nonsurgical management.  I therefore discussed treatment of surgery with  her.  The patient knows the risks, benefits, and alternatives of surgery and  decided to proceed with an L4-5 decompression and fusion.   PREOPERATIVE DIAGNOSES:  1.  L4-5 grade I necrotic spondylolisthesis.  2.  Degenerative disease.  3.  Spinal stenosis.  4.  Lumbar radiculopathy.  5.  Lumbago.   POSTOPERATIVE DIAGNOSES:  1.  L4-5 grade I necrotic spondylolisthesis.  2.  Degenerative disease.  3.  Spinal stenosis.  4.  Lumbar radiculopathy.  5.  Lumbago.   PROCEDURE:  L4 VLGIL procedure; L4-5 posterior lumbar interbody fusion;  placement of bilateral L4-5 interbody prosthesis (Capstone peak cages);  posterior nonsegmental instrumentation with Legacy titanium pedicle screws  and rods; posterolateral arthrodesis with local morselized autograft bone  and Vitoss bone scaffolding.   SURGEON:  Ophelia Charter, M.D.   ASSISTANT:  Otilio Connors, M.D.   ANESTHESIA:  General endotracheal anesthesia.   ESTIMATED BLOOD LOSS:  200 mL.   SPECIMENS:  None.   DRAINS:  None.   COMPLICATIONS:  None.   DESCRIPTION OF PROCEDURE:  The patient was brought to the operating room by  the anesthesia team.  General endotracheal anesthesia was induced.  The  patient was turned to  the prone position on the Wilson frame.  The  lumbosacral region was then prepared with Betadine scrub and Betadine  solution.  Sterile drapes were applied.  I then injected the area to be  incised with Marcaine with epinephrine solution.  I used a scalpel to make a  linear midline incision over the L4-5 interspace.  I used electrocautery to  dissect down to direct lumbar fascia, divided fascia bilaterally and  performed a bilateral subperiosteal dissection exposing the bilateral spinal  stenosis and lamina of L4 and L5.  We then obtained an intraoperative  radiograph to confirm our location.  We inserted a McCullough retractor for  exposure.  I then used the scalpel to incise the L3-4 and L4-5 interspinous  ligament.  We then used the Leksell rongeur to remove the spinous process of  the L4 and part of the L4 lamina.  We saved this bone and later cleared off  soft tissue to use in the fusion process.  We then used high speed drill to  perform bilateral L4 laminotomies.  We completed the L4 laminectomy/Gill  procedures using the Kerrison  punch.  There was considerable lateral recess  stenosis which required additional decompression.  We performed a generous  foraminotomy of the bilateral L4 and L5 nerve root using the Kerrison punch  completing the decompression. We removed the ligamentum flavum at L3-4 and  L4-5 as well.   We now turned our attention to placement of lumbar interbody fusion.  We  carefully retracted the neural structures smoothly with a __________  retractor and incised the L4-5 ruptured disc and performed aggressive  discectomy bilaterally using the pituitary forceps on the skeletal curette.  We then prepared the vertebral end plates  by distracting the contralateral  disc space using LMA spreader and then prepared the disc space using the  curette, freeing off the soft tissue.  We then inserted a 12 x 26 mm  Capstone peak cage which had been prefilled with local  morselized autograft  bone and Vitoss bone scaffolding.  I entered the disc space, of course,  after retracting the neural structures out of harm's way with a D'Errico  retractor.  We then filled medially in the disc space with local morselized  autograft bone and Vitoss bone scaffolding and then placed a second 12 x 26  peak cage in the contralateral disc space including the posterolateral  interbody fusion.   We now turn our attention to instrumentation.  Under fluoroscopic guidance  we cannulated the bilateral L4 and L5 pedicles with our probe.  We tapped  the pedicles, probed inside the tapped pedicles to rule out corticoplegias  and then started with 6.5 x 45 mm __________ screws bilaterally at L4 and  L5.  We then palpated along the medial surface of the L4 and L5 pedicles and  noted there were no cortical breeches and the L4 and L4 nerve roots were not  injured.  We then connected the unilateral pedicle screws with a lordotic  rod and slightly compressed the construct and then secured the rod in place  with caps which were torqued appropriately completing the instrumentation.   We now turned our attention to the posterolateral arthrodesis.  These had to  be drilled, the remainder of the L4-5 facet and pars region as well as the  transverse process.  We laid a combination of local morselized autograft  bone and Vitoss bone scaffolding at these decorticated posterolateral  structures including the posterolateral arthrodesis.  We then inspected the  thecal sac and bilateral L4 and L5 nerve roots.  They were well  decompressed.  We then obtained stringent hemostasis with bipolar cautery.  We irrigated the wound out with Bacitracin solution, removed the solution  and removed the Versatract retractor.  We approximated the patient's  thoracolumbar fascia with interrupted #1 Vicryl suture, subcutaneous tissue with 2-0 Vicryl suture and the skin with Steri-Strips and Benzoin.  The  wound  was then covered with Bacitracin ointment, sterile dressings applied  and drapes were removed.  The patient was subsequently returned to supine  position where she was extubated by anesthesia team and transported to post  anesthesia care unit in stable condition.  All sponge, needle and instrument  counts were correct at the end of the case.      JDJ/MEDQ  D:  09/20/2004  T:  09/21/2004  Job:  [id number]

## 2011-04-05 NOTE — H&P (Signed)
[name], [name]                ACCOUNT NO.:  [id number]   MEDICAL RECORD NO.:  [id number]          PATIENT TYPE:  AMB   LOCATION:  DAY                           FACILITY:  [facility]   PHYSICIAN:  Carole Civil, M.D.DATE OF BIRTH:  March 01, 1966   DATE OF ADMISSION:  DATE OF DISCHARGE:  LH                                HISTORY & PHYSICAL   CHIEF COMPLAINT:  Mass, right foot.   HISTORY:  This is a 45 year old female with a previously resected ganglion  from the right foot.  It came back.  MRI was obtained to check for  infection.  There was none noted, but a recurrence of the ganglia at the  base of the fourth and fifth metatarsal bones of the fourth and fifth  metatarsal bones of the right foot.  Attempt at aspiration on November 09, 2003 removed gelatinous fluid, and then it came back again.  The patient  complains that this interferes with her shoe wearing, and wishes to have it  removed.   ALLERGIES:  None.   PREVIOUS SURGERIES:  None.   MEDICATIONS:  Takes multiple medications including -  1.  Amaryl.  2.  Glipizide.  3.  Prevacid.  4.  Avecor.  5.  Trivora.  6.  Lisinopril.  7.  Crestor.  8.  Hyoscyamine.   PAST MEDICAL HISTORY:  1.  History of GERD,  2.  Reflux.  3.  Joint pain.  4.  Arthritis.  5.  Diabetes.  6.  Allergies.  7.  Sinusitis.  8.  History of pneumonia.   FAMILY HISTORY:  Heart disease, asthma, and arthritis.   SOCIAL HISTORY:  She is a smoker.  She smokes 1-1/2 packs of cigarettes per  day.  Does not drink.  She completed her education through the eighth grade.   PHYSICAL EXAMINATION:  VITAL SIGNS:  Weight 235.  Pulse 76, respiratory rate  20.  GENERAL:  She has normal development, nutrition, grooming, and hygiene.  EXTREMITIES:  No swelling or varicosities.  No temperature changes,  __________, or tenderness.  Pulses are intact.  Right foot shows a mass on  the base of the fourth and fifth metatarsal that is fluctuant.  It contains  fluid, consistent with a ganglion.  There are no neurovascular deficits.  The previous wound healed fine.  NEUROLOGIC:  Neurologic findings were normal.  PSYCHIATRIC:  Psychiatric findings were normal.   IMPRESSION:  Ganglion cyst, right foot, recurrent.   PLAN:  Excision of ganglion cyst, right foot.      SEH/MEDQ  D:  02/14/2005  T:  02/14/2005  Job:  [id number]

## 2011-04-05 NOTE — Discharge Summary (Signed)
[name], SHAHEED                ACCOUNT NO.:  [id number]   MEDICAL RECORD NO.:  [id number]          PATIENT TYPE:  INP   LOCATION:  5004                         FACILITY:  [facility]   PHYSICIAN:  Dimas Alexandria, MD      DATE OF BIRTH:  [birth year]   DATE OF ADMISSION:  12/08/2007  DATE OF DISCHARGE:  12/11/2007                               DISCHARGE SUMMARY   ADMISSION DIAGNOSIS:  Central herniated disk, L5-S1; status post fusion  L4-5.   The patient was admitted on December 08, 2007 by Dr. Dimas Alexandria for  lumbar fusion, L5-S1.  Postoperatively, the patient was admitted to  [facility].  She had blood loss estimated at 200 cc.  She was  neurovascularly motor intact postoperatively.  Postop day 1, the patient  was stable.  Hemoglobin stable at 15.5.  She did have a high glucose at  275 with known diabetes.  Drain output was 175 cc.  Dressing was clean  and dry, no active drainage.  Distally, neurovascularly motor intact.  Calves were soft.  We advanced her diet secondary to flatulence was  passed. Postop day 2, the patient was doing much better.  Left leg pain  was better and decreased hypersensitivity.  Hemoglobin stable at 14.2.  Therapy came and worked with the patient.  We discontinued the 02, the  saturation monitor, PCA, Foley, and encouraged p.o. pain medicines.  On  the 23rd, postop day 2, the patient was stable.  Pain was well  controlled on p.o. medications.  Hemoglobin was stable at 13.2.  She was  afebrile. She was independently moving about her room and ready for  discharge home.  The drains were discontinued.  Dressing was changed, no  active drainage.  The patient was discharged home on p.o. pain  medications to include Norco 10/325, Robaxin 500 mg one or two q. 6.  Asked to followup in our office 4 weeks postoperatively.  Encouraged to  walk for exercise.  No bending, stooping, lifting.   DISCHARGE DIAGNOSIS:  Central herniated disk L5-S1 status post fusion  with previous fusion at L4-5.   DISPOSITION:  Stable.   DIET:  Regular.      [name], P.A.      Dimas Alexandria, MD  Electronically Signed    MC/MEDQ  D:  01/26/2008  T:  01/26/2008  Job:  [id number]   cc:   Dimas Alexandria, MD

## 2011-04-05 NOTE — H&P (Signed)
[name]                ACCOUNT NO.:  [id number]   MEDICAL RECORD NO.:  [id number]          PATIENT TYPE:  AMB   LOCATION:  DAY                           FACILITY:  [facility]   PHYSICIAN:  Carole Civil, M.D.DATE OF BIRTH:  06/07/1966   DATE OF ADMISSION:  DATE OF DISCHARGE:  LH                                HISTORY & PHYSICAL   CHIEF COMPLAINT:  Mass on the right foot.   PAST MEDICAL HISTORY:  This patient has a history of a previously resected  ganglion from the right foot.  It has recurred.  MRI was obtained to check  for infection.  There was none noted, just the recurrence of the ganglion at  the base of the fourth and fifth metatarsal bones of the right foot.  An  attempted aspiration was performed on November 09, 2003 that did remove  gelatinous fluid.  However, it has recurred again.  It is interfering with  her shoe wear and therefore will be removed.  The patient has no allergies,  no other previous surgeries, takes multiple medications including Amaryl,  glipizide, Prevacid, Avacor, Trivora, lisinopril, Crestor, hyoscyamine.  She  has a history of GERD, reflux, joint pain, arthritis, diabetes, allergies,  sinusitis and a history of pneumonia.   FAMILY HISTORY:  Heart disease, asthma and arthritis.   SOCIAL HISTORY:  She is a smoker.  She smokes 1-1/2 packs of cigarettes per  day.  She does not drink.  She completed her education through the 8th  grade.   VITAL SIGNS:  Weight 235, pulse 76, respiratory rate 20.  APPEARANCE:  Normal development, nutrition, grooming, hygiene.  CARDIOVASCULAR:  No swelling or varicosities, temperature changes, edema or  tenderness.  Pulses are intact.  EXTREMITIES:  The right foot shows a mass at the base of the fourth and the  fifth metatarsals that is fluctuant.  It is consistent with a ganglion.  There are no neurovascular deficits.  The wound healed fine.  NEUROLOGIC:  Exam normal.  PSYCHIATRIC:  Exam normal.   IMPRESSION:  Ganglion cyst, right foot, recurrent.   PLAN:  Excision of ganglion cyst, right foot.   The patient is given informed consent.  Risks of the procedure are  infection, recurrence, pain, incisional pain, sensation change around the  incision.  There are also general risks of surgery.  The patient has agreed  to the procedure with its inherent risks.  If the procedure is not done, we  have also discussed that of course the mass would persist and shoe wear  would be difficult.      SEH/MEDQ  D:  12/07/2004  T:  12/07/2004  Job:  [id number]

## 2011-04-05 NOTE — Op Note (Signed)
NAME:  [name], [name]                          ACCOUNT NO.:  [id number]   MEDICAL RECORD NO.:  [id number]                   PATIENT TYPE:  AMB   LOCATION:  DAY                                  FACILITY:  [facility]   PHYSICIAN:  Carole Civil, M.D.           DATE OF BIRTH:  Aug 21, 1966   DATE OF PROCEDURE:  07/14/2003  DATE OF DISCHARGE:                                 OPERATIVE REPORT   HISTORY OF PRESENT ILLNESS:  This is a 45 year old female with a mass on the  right foot causing difficulty with shoe wear.  She has tried aspiration and  injection through Drs. Berline Lopes and Blanch Media and did not improve and presented to  me with continued pain and inability to wear shoes in a normal fashion.  X-  rays were negative for bony lesion.  She presents with a mass, as stated.   INDICATIONS FOR PROCEDURE:  Pain, right foot, with mass and inability to  wear shoes.   PREOPERATIVE DIAGNOSIS:  Mass, right foot.   POSTOPERATIVE DIAGNOSIS:  Mass, right foot.   PROCEDURE:  Excision of mass, right foot.   SURGEON:  Carole Civil, M.D.   ANESTHESIA:  Local with MAC.   TOURNIQUET TIME:  15 minutes.   FINDINGS:  Ganglion-like mass over the right foot down to the periosteum of  the fifth metatarsal.   DESCRIPTION OF PROCEDURE:  The patient was identified as Brewster.  My  initials were placed over the surgical site on the right foot.  The chart  and consent were reviewed to confirm the diagnosis and procedure.  She was  given 1 g of Ancef and taken to the operating room for MAC plus local with  1% lidocaine plain and 0.5% Sensorcaine in a 1:1 mixture, a total of 10 cc.  After sterile prep and drape, the tourniquet was elevated to 250 mmHg.   The skin was incised down to the capsule of the mass.  The mass was sharply  excised and extended down to the periosteum of the fifth metatarsal.  The  gelatinous material was noted and expressed from the lesion.   The wound was irrigated and  closed in layered fashion with 2-0 Vicryl and 3-  0 Monocryl.  Sensorcaine 0.5% plain, 10 cc, was injected into the area of  the skin incision.  Sterile dressings were applied, and the patient's  tourniquet was  released.  The patient was taken to the recovery room in stable condition.  The plan is for her to be full weightbearing.  Follow up next Wednesday.  Discharged on Lorcet Plus for pain.  On followup, will do a dressing change.  A pathology specimen was sent.  Carole Civil, M.D.    SEH/MEDQ  D:  07/14/2003  T:  07/14/2003  Job:  [id number]

## 2011-04-29 NOTE — Procedures (Unsigned)
BYPASS GRAFT EVALUATION  INDICATION:  Follow up peripheral vascular disease.  HISTORY: Diabetes:  Yes Cardiac: Hypertension:  No Smoking:  Currently Previous Surgery:  Right femoral to popliteal bypass graft on 07/05/2010  SINGLE LEVEL ARTERIAL EXAM                              RIGHT              LEFT Brachial:                    98                 73 Anterior tibial:             74                 49 Posterior tibial:            85                 58 Peroneal: Ankle/brachial index:        0.87               0.59  PREVIOUS ABI:  Date: 12/14/2010  RIGHT:  0.94  LEFT:  0.55  LOWER EXTREMITY BYPASS GRAFT DUPLEX EXAM:  DUPLEX:  50% to 75% stenosis in the right proximal anastomosis of the femoral to popliteal bypass graft and native distal popliteal/tibioperoneal trunk arteries.  IMPRESSION: 1. Patent right femoral to popliteal bypass graft. 2. Stenosis present as noted above. 3. Essentially unchanged ankle-brachial indices since previous study     on 12/14/2010.        ___________________________________________ V. Leia Alf, MD  SH/MEDQ  D:  04/05/2011  T:  04/05/2011  Job:  [id number]

## 2011-06-21 ENCOUNTER — Ambulatory Visit (INDEPENDENT_AMBULATORY_CARE_PROVIDER_SITE_OTHER): Payer: Medicare Other | Admitting: Thoracic Diseases

## 2011-06-21 VITALS — BP 103/63 | HR 92 | Temp 98.8°F

## 2011-06-21 DIAGNOSIS — L97409 Non-pressure chronic ulcer of unspecified heel and midfoot with unspecified severity: Secondary | ICD-10-CM

## 2011-06-21 DIAGNOSIS — L97529 Non-pressure chronic ulcer of other part of left foot with unspecified severity: Secondary | ICD-10-CM

## 2011-06-21 DIAGNOSIS — I739 Peripheral vascular disease, unspecified: Secondary | ICD-10-CM

## 2011-06-21 NOTE — Progress Notes (Signed)
[facility] [place] HISTORY AND PHYSICAL -PAD  History of Present Illness  [name] is a 45 y.o. female patient who presents with known left lower extremity PAD. Pt. States she was trying to find a shoe to fit her and the sandle that she bought caused a blister on the lateral aspect of the 5th metatarsal head. She was seen by the wound care center, Dr Hart Carwin who has been treating her for this and referred her to [place]. The pt has known bilat SFA occlusions and underwent a right Fem-POP bypass with GSV by Dr. Trula Slade 1 year ago. She has a small  wound on the lateral malleolus of the  leg which is almost healed. denies claudication in either extremity. The wound on her left leg has become bigger and necrotic. Pt. denies rest pain;and night pain. She states her legs swell but the left foot and ankle has remained more swollen.  Non-Invasive Vascular Imaging done 04/05/11  ABI: RIGHT 0.76;  LEFT 0.50 DUPLEX SCAN OF BYPASS: 50-75% stenosis of right proximal anastomosis  Patent right fem pop bypass   Previous angiogram: Yes with findings of bilat SFA occlusions and 3 vessel runoff on left   ROS: 12 point ROS Negative except as stated in HPI  Past Medical History  Diagnosis Date  . Diabetes mellitus   . PAD (peripheral artery disease)   . Chronic back pain   . HTN (hypertension)   . Asthma   . Hyperlipidemia     History   Social History  . Marital Status: Married    Spouse Name: N/A    Number of Children: N/A  . Years of Education: N/A   Occupational History  . Not on file.   Social History Main Topics  . Smoking status: Smoker, Current Status Unknown -- 1.5 packs/day    Types: Cigarettes  . Smokeless tobacco: Not on file  . Alcohol Use: No  . Drug Use: Not on file  . Sexually Active: Not on file   Other Topics Concern  . Not on file   Social History Narrative  . No narrative on file    Family History  Problem Relation Age of Onset  .  Coronary artery disease Mother   . Peripheral vascular disease Mother     Meds  Pt also takes Glipizide, lyrica TID and Robaxin but did not know the doses of these meds  Lasix 20mg  TID prn swelling Current outpatient prescriptions:aspirin 325 MG tablet, Take 325 mg by mouth daily.  , Disp: , Rfl: ;  insulin glargine (LANTUS) 100 UNIT/ML injection, Inject 15 Units into the skin at bedtime.  , Disp: , Rfl: ;  metFORMIN (GLUCOPHAGE) 1000 MG tablet, Take 1,000 mg by mouth 2 (two) times daily with a meal.  , Disp: , Rfl:   Physical Examination  Filed Vitals:   06/21/11 1743  BP: 103/63  Pulse: 92  Temp: 98.8 F (37.1 C)    General: A&O x 3, WDWN female in NAD Eyes: PERRLA,  Pulmonary:  CTAB, Negative  Rales, Negative rhonchi, & Negative wheezing,  Cardiac: regular Rythm ,  Negative Murmurs,  Negative  rubs or gallops Gastrointestinal: soft, NTND,  Musculoskeletal:Strength 5/5 all extremities with good motion Skin of left lower extremity  with quarter size necrotic ulcer  of lateral aspect of left foot. It did not appear to go down to bone. There was no bone exposed .  Extremities without ischemic changes except  left lower extremity with  Gangrene of lateral aspect of left foot; with cellulitis to the mid foot ; with open wounds; without drainage.  [name] lateral malleolus wound is size of a dime and granulating in nicely  Vascular:     RIGHT   LEFT         LOWER EXTREMITY PULSES             RIGHT             LEFT    FEMORAL palpable palpable            DORSALIS PEDIS palpable monophasic by Doppler         PERONEAL  weak and monophasic by Doppler    Neurologic: A&O X 3; Appropriate Affect ; SENSATION ;normal; MOTOR FUNCTION: normal 5/5 strength in all tested muscle groups   ASSESSMENT: [name] is a 45 y.o. female who presents with: left upper PAD WITH non healing, necrotic ulcer on the lateral aspect of the left foot .  Pt has known SFA occlusion bilat and is S/P  right femoral to popliteal bypass with vein.  PLAN:  Pt was placed on Cipro 500mg  BID and will return to clinic on Monday 8/6 to be seen by Dr Trula Slade with ABI's for further evaluation and decisions regarding appropriate treatment.  Case discussed with Dr. Bridgett Larsson  Attending MD:  Adele Barthel, MD

## 2011-06-24 ENCOUNTER — Encounter (INDEPENDENT_AMBULATORY_CARE_PROVIDER_SITE_OTHER): Payer: Medicare Other

## 2011-06-24 ENCOUNTER — Ambulatory Visit (INDEPENDENT_AMBULATORY_CARE_PROVIDER_SITE_OTHER): Payer: Medicare Other | Admitting: Surgery

## 2011-06-24 ENCOUNTER — Encounter: Payer: Self-pay | Admitting: Surgery

## 2011-06-24 VITALS — BP 82/54 | HR 97 | Temp 97.9°F | Ht 66.0 in | Wt 170.0 lb

## 2011-06-24 DIAGNOSIS — Z48812 Encounter for surgical aftercare following surgery on the circulatory system: Secondary | ICD-10-CM

## 2011-06-24 DIAGNOSIS — L98499 Non-pressure chronic ulcer of skin of other sites with unspecified severity: Secondary | ICD-10-CM

## 2011-06-24 DIAGNOSIS — E1159 Type 2 diabetes mellitus with other circulatory complications: Secondary | ICD-10-CM

## 2011-06-24 DIAGNOSIS — I739 Peripheral vascular disease, unspecified: Secondary | ICD-10-CM

## 2011-06-24 DIAGNOSIS — G589 Mononeuropathy, unspecified: Secondary | ICD-10-CM

## 2011-06-24 DIAGNOSIS — G629 Polyneuropathy, unspecified: Secondary | ICD-10-CM

## 2011-06-24 NOTE — Progress Notes (Signed)
Subjective:     Patient ID: [name], female   DOB: [birth year], 45 y.o.   MRN: [id number]  HPI the patient is a 45 year old female well known to me having undergone right femoral to below knee popliteal artery bypass graft with vein on 07/06/2003 limbs salvage in the setting of a nonhealing wound. She was recently seen by Heron Sabins in the office with a new issue of a left foot wound. This is being followed by Dr. Nils Pyle at the wound center. It has been present for approximately 6 weeks. She feels that as a result of having worn a shoe that did not fit very well. The patient has known SFA occlusion on the left. She has been on antibiotic therapy. She is not seeing significant improvement in her foot. She has minimal discomfort. The patient is almost healed her right ankle wound on the lateral malleolus.  The patient continues to suffer from diabetes hypertension hyperlipidemia which are medically managed. She continues to be a smoker.   Review of Systems  All other systems reviewed and are negative.   Past Medical History  Diagnosis Date  . Diabetes mellitus   . PAD (peripheral artery disease)   . Chronic back pain   . HTN (hypertension)   . Asthma   . Hyperlipidemia     History  Substance Use Topics  . Smoking status: Current Everyday Smoker -- 0.5 packs/day for 30 years    Types: Cigarettes  . Smokeless tobacco: Not on file  . Alcohol Use: No    Family History  Problem Relation Age of Onset  . Coronary artery disease Mother   . Peripheral vascular disease Mother   . Heart disease Mother   . Other Mother     Venous insuffiency  . Heart disease Father   . Diabetes Sister   . Hypertension Sister   . Diabetes Brother   . Hypertension Brother     Allergies  Allergen Reactions  . Penicillins     Severe Headache    Current outpatient prescriptions:aspirin 325 MG tablet, Take 325 mg by mouth daily.  , Disp: , Rfl: ;  ciprofloxacin (CIPRO) 500 MG tablet, , Disp: ,  Rfl: ;  diazepam (VALIUM) 10 MG tablet, Take 10 mg by mouth every 6 (six) hours as needed.  , Disp: , Rfl: ;  glimepiride (AMARYL) 2 MG tablet, , Disp: , Rfl: ;  HYDROcodone-acetaminophen (LORTAB) 10-500 MG per tablet, , Disp: , Rfl:  insulin glargine (LANTUS) 100 UNIT/ML injection, Inject 15 Units into the skin at bedtime.  , Disp: , Rfl: ;  LYRICA 100 MG capsule, , Disp: , Rfl: ;  metFORMIN (GLUCOPHAGE) 1000 MG tablet, Take 1,000 mg by mouth 2 (two) times daily with a meal.  , Disp: , Rfl: ;  methocarbamol (ROBAXIN) 500 MG tablet, , Disp: , Rfl: ;  morphine (MS CONTIN) 15 MG 12 hr tablet, , Disp: , Rfl:   Filed Vitals:   06/24/11 1517  Height: 5\' 6"  (1.676 m)  Weight: 170 lb (77.111 kg)    Body mass index is 27.44 kg/(m^2).           Objective:   Physical Exam  Constitutional: She is oriented to person, place, and time. She appears well-developed and well-nourished.  HENT:  Head: Normocephalic and atraumatic.  Neck: Neck supple.  Cardiovascular: Normal rate.        Left pedal pulse is not palpable  Pulmonary/Chest: Effort normal.  Abdominal: Soft.  Musculoskeletal: Normal  range of motion.  Neurological: She is alert and oriented to person, place, and time.  Skin: Skin is warm.       At the base of the left fifth metatarsal head there is an open area with necrotic tissue at its base. There is no bone exposed. Erythema tracts up to the base of the fifth toe and up onto the dorsum of the foot there is a small punctate open area on the right lateral malleolus which is nearly healed.  Psychiatric: She has a normal mood and affect.      Assessment:       peripheral vascular disease with nonhealing left foot wound Plan:     The patient had a Doppler exam today this shows an ABI of 0.59 on the left with monophasic waveforms. The right leg has biphasic waveforms with an ABI of 0.88 I discussed with the patient at this is a limb threatening situation that needs to be addressed  urgently. First, she needs to undergo repeat angiography to determine a distal target for bypass graft. I'm going to do this tomorrow in the catheter lab. L. plan on accessing the left groin studying both legs. The patient is being followed for a proximal stenosis and a right femoropopliteal bypass graft that was evaluated and possibly treat this area if it is deemed to be significant. I will plan on getting diagnostic information only on the left leg as he is not a candidate for percutaneous intervention. The patient has been placed on the schedule for a left femoral below-knee popliteal bypass graft this Thursday assuming the plan is obtained based on her angiography. The patient has been vein mapped in the past and was found to have an adequate left greater saphenous vein. I will reimage this in the operating room at the time of her operation proceed with vein bypass versus Gore-Tex. I spoke with the [facility] cardiology earlier today like to have her cleared from a cardiac perspective prior to proceeding with her operation. We'll try to do this while in the hospital tomorrow. She has previously been followed by [place] however she wishes to change to the [facility].

## 2011-06-25 ENCOUNTER — Ambulatory Visit (HOSPITAL_COMMUNITY)
Admission: RE | Admit: 2011-06-25 | Discharge: 2011-06-25 | Disposition: A | Discharge status: Home / Self Care | Payer: Medicare Other | Source: Ambulatory Visit | Attending: Surgery | Admitting: Surgery

## 2011-06-25 ENCOUNTER — Ambulatory Visit (HOSPITAL_COMMUNITY): Payer: Medicare Other

## 2011-06-25 DIAGNOSIS — I743 Embolism and thrombosis of arteries of the lower extremities: Secondary | ICD-10-CM

## 2011-06-25 DIAGNOSIS — L98499 Non-pressure chronic ulcer of skin of other sites with unspecified severity: Secondary | ICD-10-CM | POA: Insufficient documentation

## 2011-06-25 DIAGNOSIS — Z01810 Encounter for preprocedural cardiovascular examination: Secondary | ICD-10-CM

## 2011-06-25 DIAGNOSIS — I739 Peripheral vascular disease, unspecified: Secondary | ICD-10-CM | POA: Insufficient documentation

## 2011-06-25 DIAGNOSIS — I70309 Unspecified atherosclerosis of unspecified type of bypass graft(s) of the extremities, unspecified extremity: Secondary | ICD-10-CM | POA: Insufficient documentation

## 2011-06-25 LAB — COMPREHENSIVE METABOLIC PANEL
ALT: 16 U/L (ref 0–35)
Alkaline Phosphatase: 99 U/L (ref 39–117)
BUN: 6 mg/dL (ref 6–23)
CO2: 28 mEq/L (ref 19–32)
Chloride: 100 mEq/L (ref 96–112)
Glucose, Bld: 181 mg/dL — ABNORMAL HIGH (ref 70–99)
Potassium: 3.8 mEq/L (ref 3.5–5.1)
Sodium: 138 mEq/L (ref 135–145)
Total Bilirubin: 0.2 mg/dL — ABNORMAL LOW (ref 0.3–1.2)
Total Protein: 5.7 g/dL — ABNORMAL LOW (ref 6.0–8.3)

## 2011-06-25 LAB — CBC
MCH: 30.2 pg (ref 26.0–34.0)
MCHC: 34.7 g/dL (ref 30.0–36.0)
MCV: 86.8 fL (ref 78.0–100.0)
Platelets: 248 10*3/uL (ref 150–400)
RDW: 12.8 % (ref 11.5–15.5)

## 2011-06-25 LAB — URINALYSIS, ROUTINE W REFLEX MICROSCOPIC
Bilirubin Urine: NEGATIVE
Glucose, UA: 500 mg/dL — AB
Hgb urine dipstick: NEGATIVE
Protein, ur: NEGATIVE mg/dL

## 2011-06-25 LAB — POCT I-STAT, CHEM 8
Calcium, Ion: 1.04 mmol/L — ABNORMAL LOW (ref 1.12–1.32)
Chloride: 99 mEq/L (ref 96–112)
Glucose, Bld: 300 mg/dL — ABNORMAL HIGH (ref 70–99)
HCT: 45 % (ref 36.0–46.0)
Hemoglobin: 15.3 g/dL — ABNORMAL HIGH (ref 12.0–15.0)

## 2011-06-25 LAB — GLUCOSE, CAPILLARY: Glucose-Capillary: 214 mg/dL — ABNORMAL HIGH (ref 70–99)

## 2011-06-25 LAB — TYPE AND SCREEN: ABO/RH(D): O NEG

## 2011-06-25 LAB — PROTIME-INR: Prothrombin Time: 15.2 seconds (ref 11.6–15.2)

## 2011-06-27 ENCOUNTER — Ambulatory Visit (HOSPITAL_COMMUNITY)
Admission: RE | Admit: 2011-06-27 | Discharge: 2011-06-27 | Disposition: A | Discharge status: Home / Self Care | Payer: Medicare Other | Source: Ambulatory Visit | Attending: Surgery | Admitting: Surgery

## 2011-06-27 ENCOUNTER — Other Ambulatory Visit: Payer: Self-pay | Admitting: Surgery

## 2011-06-27 ENCOUNTER — Inpatient Hospital Stay (HOSPITAL_COMMUNITY)
Admission: RE | Admit: 2011-06-27 | Discharge: 2011-07-01 | DRG: 253 | Disposition: A | Discharge status: Home Health | Payer: Medicare Other | Source: Ambulatory Visit | Attending: Surgery | Admitting: Surgery

## 2011-06-27 DIAGNOSIS — I739 Peripheral vascular disease, unspecified: Secondary | ICD-10-CM

## 2011-06-27 DIAGNOSIS — I70409 Unspecified atherosclerosis of autologous vein bypass graft(s) of the extremities, unspecified extremity: Secondary | ICD-10-CM | POA: Diagnosis present

## 2011-06-27 DIAGNOSIS — J9819 Other pulmonary collapse: Secondary | ICD-10-CM | POA: Diagnosis present

## 2011-06-27 DIAGNOSIS — L98499 Non-pressure chronic ulcer of skin of other sites with unspecified severity: Secondary | ICD-10-CM

## 2011-06-27 DIAGNOSIS — L97909 Non-pressure chronic ulcer of unspecified part of unspecified lower leg with unspecified severity: Secondary | ICD-10-CM | POA: Diagnosis present

## 2011-06-27 DIAGNOSIS — F17200 Nicotine dependence, unspecified, uncomplicated: Secondary | ICD-10-CM | POA: Diagnosis present

## 2011-06-27 DIAGNOSIS — E119 Type 2 diabetes mellitus without complications: Secondary | ICD-10-CM | POA: Diagnosis present

## 2011-06-27 DIAGNOSIS — M549 Dorsalgia, unspecified: Secondary | ICD-10-CM | POA: Diagnosis present

## 2011-06-27 DIAGNOSIS — I7092 Chronic total occlusion of artery of the extremities: Secondary | ICD-10-CM | POA: Diagnosis present

## 2011-06-27 LAB — GLUCOSE, CAPILLARY
Glucose-Capillary: 252 mg/dL — ABNORMAL HIGH (ref 70–99)
Glucose-Capillary: 172 mg/dL — ABNORMAL HIGH (ref 70–99)

## 2011-06-27 NOTE — Consult Note (Addendum)
[name], GRENNAN                ACCOUNT NO.:  [id number]  MEDICAL RECORD NO.:  [id number]  LOCATION:  [place]                         FACILITY:  [facility]  PHYSICIAN:  Darlin Coco, M.D. DATE OF BIRTH:  25-Jun-1966  DATE OF CONSULTATION:  06/25/2011 DATE OF DISCHARGE:                                CONSULTATION   PRIMARY CARDIOLOGIST:  New to [facility], prefers to follow up in [place].  She previously was seen by [facility], best prefers [place].  PRIMARY MEDICAL DOCTOR:  Sherrilee Gilles. Gerarda Fraction, MD  CHIEF COMPLAINT:  Leg pain.  REASON FOR CONSULTATION:  Preop clearance for left fem-pop bypass grafting.  HISTORY OF PRESENT ILLNESS:  Ms. Magnone is a 45 year old female with a significant history of CAD, insulin-dependent diabetes mellitus, hypertension, hyperlipidemia, who was seen in the vascular office for left nonhealing foot wound for approximately last 6 weeks.  She was treated with antibiotics with no improvement.  Left ABI was 0.59 and right ABI was 0.88.  She is here for peripheral vascular angiography with plans for a left fem-pop bypass grafting this Thursday.  We are asked to see her in regard to preop clearance.  She was previously seen by [facility] approximately 3 years ago with reported negative stress testing.  She has had no anginal symptoms including chest pain, shortness of breath either at rest or with exertion.  She does not particularly exert herself however secondary to her lower extremity pain secondary to peripheral vascular disease.  She is able to complete all of her ADLs without chest symptoms.  PAST MEDICAL HISTORY: 1. PVD.     a.     Right common femoral to below the knee popliteal bypass on      July 06, 2010.     b.     Apr 05, 2011, duplex for the bypass graft showed 50-75%      stenosis to the right proximal anastomosis of the bypass in native      distal popliteal plus tibial trunk artery.    c.     ABIs as noted above. 2. Insulin-dependent diabetes mellitus. 3. Hyperlipidemia. 4. Hypertension. 5. Asthma. 6. Chronic back pain. 7. Status post L4-L5 fusion 2005. 8. Status post L5-S1 surgery in 2009. 9. Status post ganglion cyst resection in the right foot, March 2006     and August 2004. 10.Ongoing tobacco abuse.  MEDICATIONS: 1. Aspirin 325 mg daily. 2. Cipro 500 mg b.i.d. 3. Loratadine 10 mg daily as needed. 4. Methocarbamol 500 mg q.i.d. 5. Metformin 1000 mg 2 tablets daily. 6. Lyrica 100 mg t.i.d. 7. Lantus 15 units at bedtime. 8. Hydrocodone/APAP 10/500 mg 1-2 tablets q.i.d. p.r.n. pain. 9. Glimepiride 2 mg daily every morning. 10.Furosemide 20 mg b.i.d. p.r.n. 11.Fenofibric acid 134 mg daily at bedtime. 12.Diazepam 10 mg 1 tablet q.i.d. p.r.n. anxiety.  ALLERGIES:  PENICILLIN has caused severe headache in the past.  SOCIAL HISTORY:  The patient lives in [place].  She is married.  She has an ongoing tobacco use history, half a pack per day for 30 years. She denies any alcohol use.  FAMILY HISTORY:  Positive for CAD, PVD in her mother.  Positive for CAD in her father.  She has a sister and a brother, who both have diabetes and hypertension.  REVIEW OF SYSTEMS:  All other systems reviewed and otherwise negative.  LABORATORY DATA:  Hemoglobin 12, hematocrit 34.  Sodium 135, potassium 3.5, chloride 96, glucose 70, BUN 6, creatinine 0.4.  RADIOLOGIC STUDIES:  None.  EKG:  Normal sinus rhythm, low voltage with T-wave inversion in V2, but otherwise no acute changes.  PHYSICAL EXAMINATION:  VITAL SIGNS:  Temperature 98.7, pulse 85, respirations 18, blood pressure 92/60, pulse ox 95% on room air. GENERAL:  This is a pleasant middle-aged white female in no acute distress lying flat in bed. HEENT:  Normocephalic, atraumatic with extraocular movements intact. Clear sclerae.  Nares without discharge. NECK:  Supple without JVD. HEART:  Auscultation of the  heart reveals regular rate and rhythm with S1 and S2 without obvious murmurs, rubs, or gallops.  LUNGS:  Clear to auscultation bilaterally without wheezes, rales, or rhonchi. ABDOMEN:  Soft, nontender, nondistended. EXTREMITIES:  Warm, dry without edema.  She has wrapped right ankle. Her legs are somewhat erythematous and warm bilaterally. NEUROLOGIC:  She is alert and oriented x3 and responds to questions appropriately, but is somewhat sleepy.  ASSESSMENT AND PLAN:  The patient was seen and examined by Dr. Mare Ferrari and myself.  This is a 45 year old female with no prior history of coronary artery disease, but a negative stress test approximately 3 years ago, who has known peripheral vascular disease, insulin-dependent diabetes mellitus, hypertension, and hyperlipidemia.  She presents with nonhealing left foot wound in the setting of decreased ABIs and is for PV angio today with plan fem-pop bypass grafting on Thursday.  We are asked to see her in regard to preoperative clearance.  The patient denies any symptoms of angina pectoris or unexplained dyspnea.  EKG is nonacute.  She has not had a chest x-ray this admission, so we will order one for baseline.  At this time, she is felt to be at satisfactory risk from a cardiac standpoint and we feel she is okay to proceed with vascular surgery. The patient would prefer to follow up in [place] if she has any further cardiac needs.  Thank you for the opportunity to participate in the care of this patient.     Melina Copa, P.A.C.   ______________________________ Darlin Coco, M.D.    DD/MEDQ  D:  06/25/2011  T:  06/25/2011  Job:  [id number]  cc:   [place] Gerarda Fraction, MD V. Leia Alf, MD  Electronically Signed by Darlin Coco M.D. on 07/04/2011 12:54:39 PM Electronically Signed by Melina Copa  on 07/10/2011 10:00:45 AM

## 2011-06-28 LAB — BASIC METABOLIC PANEL
CO2: 31 mEq/L (ref 19–32)
Chloride: 98 mEq/L (ref 96–112)
Potassium: 3.5 mEq/L (ref 3.5–5.1)

## 2011-06-28 LAB — POCT I-STAT 4, (NA,K, GLUC, HGB,HCT)
Glucose, Bld: 184 mg/dL — ABNORMAL HIGH (ref 70–99)
HCT: 34 % — ABNORMAL LOW (ref 36.0–46.0)
Hemoglobin: 11.6 g/dL — ABNORMAL LOW (ref 12.0–15.0)

## 2011-06-28 LAB — GLUCOSE, CAPILLARY: Glucose-Capillary: 223 mg/dL — ABNORMAL HIGH (ref 70–99)

## 2011-06-28 LAB — CBC
HCT: 36.6 % (ref 36.0–46.0)
Hemoglobin: 12.4 g/dL (ref 12.0–15.0)
MCH: 29.7 pg (ref 26.0–34.0)
RBC: 4.17 MIL/uL (ref 3.87–5.11)

## 2011-06-28 LAB — HEMOGLOBIN A1C
Hgb A1c MFr Bld: 13.1 % — ABNORMAL HIGH (ref <5.7)
Mean Plasma Glucose: 329 mg/dL — ABNORMAL HIGH (ref <117)

## 2011-06-29 LAB — GLUCOSE, CAPILLARY: Glucose-Capillary: 147 mg/dL — ABNORMAL HIGH (ref 70–99)

## 2011-06-29 LAB — CLOSTRIDIUM DIFFICILE BY PCR: Toxigenic C. Difficile by PCR: NEGATIVE

## 2011-06-30 DIAGNOSIS — Z48812 Encounter for surgical aftercare following surgery on the circulatory system: Secondary | ICD-10-CM

## 2011-06-30 DIAGNOSIS — I739 Peripheral vascular disease, unspecified: Secondary | ICD-10-CM

## 2011-06-30 LAB — GLUCOSE, CAPILLARY
Glucose-Capillary: 131 mg/dL — ABNORMAL HIGH (ref 70–99)
Glucose-Capillary: 166 mg/dL — ABNORMAL HIGH (ref 70–99)
Glucose-Capillary: 117 mg/dL — ABNORMAL HIGH (ref 70–99)

## 2011-07-01 LAB — GLUCOSE, CAPILLARY: Glucose-Capillary: 145 mg/dL — ABNORMAL HIGH (ref 70–99)

## 2011-07-15 ENCOUNTER — Ambulatory Visit: Payer: Medicare Other | Admitting: Surgery

## 2011-07-15 ENCOUNTER — Encounter: Payer: Self-pay | Admitting: Surgery

## 2011-07-18 NOTE — Op Note (Addendum)
[name], [name]                ACCOUNT NO.:  [id number]  MEDICAL RECORD NO.:  [id number]  LOCATION:  [place]                         FACILITY:  [facility]  PHYSICIAN:  Theotis Burrow IV, MDDATE OF BIRTH:  1966/01/16  DATE OF PROCEDURE: DATE OF DISCHARGE:                              OPERATIVE REPORT   PREOPERATIVE DIAGNOSIS:  Left leg ulcer.  POSTOPERATIVE DIAGNOSIS:  Left leg ulcer.  PROCEDURE PERFORMED: 1. Ultrasound access left femoral artery. 2. Abdominal aortogram. 3. Bilateral lower extremity runoff. 4. Second-order catheterization.  INDICATIONS:  [name] is a 45 year old female who presented to the office yesterday with a large ulcer on the lateral aspect of her left foot.  She has known superficial femoral artery occlusion from previous catheterizations.  She also has a right fem-pop bypass graft which has been followed by ultrasound and found to have moderately elevated velocities of the proximal anastomosis in the 220 cm/sec range.  She comes in today for diagnosis.  DESCRIPTION OF PROCEDURE:  The patient was identified in the holding area, taken to room 8, placed supine on the table.  The left groin was prepped and draped in usual fashion.  Time-out was called.  Left femoral artery was evaluated with ultrasound and found to be widely patent. Digital ultrasound image was acquired.  The left femoral artery was accessed under ultrasound guidance with a micropuncture needle.  An 0.018 wire was advanced without resistance into the iliac system followed by micropuncture sheath.  A Bentson wire was then placed and a 5-French sheath was ultimately inserted.  Over the wire, an Omni flush catheter was advanced to the level of L1.  Abdominal aortogram was obtained.  Next, using Omni flush catheter and Bentson wire, the aortic bifurcation was crossed, catheter was placed in the right external iliac artery right leg runoff was performed.  Next, the catheter was removed over  wire and retrograde sheath injections were performed for evaluation of the left leg.  FINDINGS:  Aortogram:  There are single renal arteries bilaterally which are widely patent.  The infrarenal abdominal aorta is small in caliber, but widely patent.  Bilateral common and external iliac arteries are widely patent.  Bilateral hypogastric arteries are widely patent.  Right lower extremity:  The right common femoral artery is widely patent.  There is disease within the profunda femoral artery approximately 3 cm distal to its origin.  There is about a 70% stenosis. There is luminal narrowing within the proximal portion of the bypass graft to the order of approximately 50%.  The bypass graft is widely patent.  The anastomosis is to the below-knee popliteal artery without significant stenosis.  There is three-vessel runoff to the right foot.  Left lower extremity:  The left common femoral artery is widely patent. The left profunda femoral artery is patent with mild disease. Superficial femoral artery is occluded.  There is reconstitution of the above-knee popliteal artery.  The below-knee popliteal artery is widely patent.  There is three-vessel runoff.  At this point in time, decision was made to terminate the procedure.  Catheters and wires were removed. The patient to be taken to the holding area for sheath pull.  IMPRESSION: 1.  Fifty percent stenosis proximal bypass graft. 2. Occluded left superficial femoral artery with reconstitution of the     popliteal artery and 3-vessel runoff.     Eldridge Abrahams, MD     VWB/MEDQ  D:  06/25/2011  T:  06/26/2011  Job:  [id number]  Electronically Signed by Orvan Falconer IV MD on 07/18/2011 12:04:15 AM

## 2011-07-18 NOTE — Discharge Summary (Signed)
NAMEAVALEIGH, SHELLHAMMER                ACCOUNT NO.:  [id number]  MEDICAL RECORD NO.:  [id number]  LOCATION:  2007                         FACILITY:  [facility]  PHYSICIAN:  Theotis Burrow IV, MDDATE OF BIRTH:  27-Nov-1965  DATE OF ADMISSION:  06/27/2011 DATE OF DISCHARGE:  07/01/2011                              DISCHARGE SUMMARY   ADMISSION DIAGNOSIS:  Left foot ulcer.  HISTORY OF PRESENT ILLNESS:  This is a 45 year old female, who presented to the office with a large ulcer on the lateral aspect of her left foot. She has known superficial femoral artery occlusion for previous catheterization.  She also has a right fem-pop bypass graft, which has been followed by ultrasound and found to have moderately elevated velocities of the proximal stenosis in the 220 centimeters for second range.  HOSPITAL COURSE:  The patient was admitted to the hospital, taken to the peripheral vascular lab where she underwent an abdominal aortogram. This revealed a 50% stenosis of the proximal bypass graft as well as occluded left SFA with reconstitution of the popliteal artery and three- vessel runoff.  The next day, she was taken to the operating room where she underwent a left common femoral to below-knee popliteal artery bypass graft with nonreversed translocated ipsilateral greater saphenous vein.  She tolerated the procedure well and was transported to the recovery room in stable condition.  By postoperative day #1, she did have some left lower extremity edema and a positive left dorsalis pedal pulse per Doppler.  A tobacco cessation consult was also obtained.  By postoperative day #2, she was transferred to the telemetry floor and started on Levaquin for his left foot wound.  By postoperative day #3, she was continued to ambulate with physical therapy as well as occupational therapy.  On postoperative day #4, Dr. Trula Slade emphasized the importance of smoking cessation.  His ABIs postoperatively were  0.94 on the right and a 0.81 on the left.  He was discharged home on postoperative day #4.  Otherwise, his hospital course including increasing ambulation as well as increasing intake of solids without difficulty.  DISCHARGE INSTRUCTIONS:  He is discharged home with extensive instructions on wound care, progressive ambulation.  He is instructed not to drive or perform any heavy lifting for 1 month.  DISCHARGE DIAGNOSES: 1. Left foot ulcer.     a.     Status post abdominal aortogram.     b.     Status post left common femoral to below-knee popliteal      artery bypass graft on June 27, 2011. 2. Diabetes. 3. Chronic back pain. 4. Hypertension. 5. Asthma. 6. Hyperlipidemia. 7. Current tobacco use.  DISCHARGE MEDICATIONS: 1. Aspirin 325 mg 1-2 tablets p.o. daily. 2. Cipro 500 mg one p.o. b.i.d. for 3 weeks. 3. Diazepam 10 mg p.o. q.i.d. p.r.n. 4. Fenofibrate 134 mg p.o. at bedtime. 5. Lasix 20 mg p.o. b.i.d. p.r.n. 6. Amaryl 2 mg p.o. q.a.m. 7. Hydrocodone 10/500 one to two p.o. q.6 h. p.r.n. pain. 8. Lantus insulin 15 units subcu q.p.m. 9. Loratadine 10 mg p.o. daily p.r.n. 10.Lyrica 100 mg p.o. t.i.d. 11.Metformin 1000 mg two tablets p.o. daily. 12.Methocarbamol 500 mg p.o. q.6 h.  FOLLOWUP:  The patient is to follow up with Dr. Trula Slade in 2 weeks.     Evorn Gong, PA   ______________________________ V. Leia Alf, MD    SE/MEDQ  D:  07/15/2011  T:  07/15/2011  Job:  [id number]  Electronically Signed by Evorn Gong PA on 07/17/2011 01:49:03 PM Electronically Signed by Orvan Falconer IV MD on 07/18/2011 12:04:26 AM

## 2011-07-18 NOTE — Op Note (Signed)
[name], [name]                ACCOUNT NO.:  [id number]  MEDICAL RECORD NO.:  [id number]  LOCATION:  2007                         FACILITY:  [facility]  PHYSICIAN:  Theotis Burrow IV, MDDATE OF BIRTH:  1966/09/13  DATE OF PROCEDURE:  06/27/2011 DATE OF DISCHARGE:                              OPERATIVE REPORT   PREOPERATIVE DIAGNOSIS:  Left foot ulcer.  POSTOPERATIVE DIAGNOSIS:  Left foot ulcer.  PROCEDURE PERFORMED:  Left common femoral to below knee popliteal artery bypass graft with nonreversed translocated ipsilateral greater saphenous vein.  SURGEON: 1. Leia Alf, MD  ASSISTANT:  Evorn Gong, PA  COMPLICATIONS:  None.  FINDINGS:  I made a long arteriotomy in the common femoral artery and spatulated the hood of the vein graft to cover this.  The distal anastomosis was also approximately a centimeter and half beginning in the popliteal extending down onto the tibioperoneal trunk at the level of the anterior tibial artery takeoff.  The vein narrowed down to approximately 3 mm in its midportion.  Outside of this, it was of adequate quality, greater than 3.5 and 4 mm.  ANESTHESIA:  General.  INDICATIONS:  This is a 46 year old female who has previously undergone right leg bypass for nonhealing wound.  She presented to my office with a nonhealing wound at the base of her right fifth toe.  She underwent arteriogram to evaluate her distal target.  She comes in today for her bypass.  PROCEDURE:  The patient was identified in the holding area and taken to room #8, placed supine on the table.  General anesthesia was administered.  The patient was prepped and draped in the usual fashion. A time-out was called.  The patient did have some yeast-appearing areas in her groin.  I gave her a dose of Diflucan.  I elected to continue with the operation due to the limb-threatening nature of her wound.  I made an oblique incision, which was slightly cephalad to the  reddened area in her groin through this incision.  I used cautery to dissect down to the femoral sheath.  Femoral sheath was then opened sharply.  I exposed the common femoral artery from the inguinal ligament down to the takeoff of the profunda femoral artery.  Once adequate arterial exposure was performed, I turned my attention towards the vein.  The saphenofemoral junction was identified and the side branches were ligated between 3-0 silk ties.  The saphenous vein was then harvested through skip incisions going down the leg.  Again, side branches were divided between 3-0 and 4-0 silk ties and metal clips.  In the below- knee incision, the vein and artery were harvested through the same incision to expose the artery.  The fascia was opened with cautery.  The gastrocnemius muscle was reflected posteriorly.  Popliteal space was entered.  The artery and vein were dissected free.  The artery was somewhat thickened down to the area of the takeoff of the anterior tibial artery.  I did take down part of the soleus muscle to get down to the tibioperoneal trunk where I thought I might need to go down to for the distal anastomosis.  Once adequate exposure was obtained, I  made sure that I had adequate vein and the vein dissected free.  The vein did begin to branch distally and so I was limited without much more vein I could remove.  Once I had gotten all the vein out, it was distally ligated with 2-0 silk tie.  The saphenofemoral junction was oversewn with running 5-0 Prolene in 2 layers.  The vein was then prepared on the back table.  It distended nicely.  Again, in its midportion, it was approximately 3-3.5 2 mm.  The rest of the vein appeared healthy. Because it did taper down, I elected to place this in a nonreversed fashion.  A used a Gore tunneler to create a tunnel going through the two heads of the gastrocnemius muscle through the popliteal space and a subsartorial plane.  Once the tunnel  was created, the patient was fully heparinized.  After heparin had circulated, Henley clamps were placed proximally and distally on the common femoral artery.  Common femoral artery was then opened with #11 blade and extended with Potts scissors. There was a fair amount of posterior plaque and for that reason I made a rather long arteriotomy to get more proximal to the plaque as opposed to doing an endarterectomy.  I made approximately a 2.5-cm proximal anastomosis.  The vein was beveled to fit the size of the arteriotomy and then a running anastomosis was created with 5-0 Prolene.  Once the anastomosis was completed, the clamps were released.  I then used a valvulotome to lyse the valves within the vein graft.  The valvulotome was made 3 passes.  There was an excellent pulsatile flow through the vein.  The vein was then marked with an ink pen to ensure proper orientation and I then brought through the previously created tunnel.  I then placed a tourniquet on the upper thigh.  The leg was exsanguinated with an Esmarch.  Tourniquet was taken to 250 mm of pressure.  I next made an arteriotomy in the below-knee popliteal artery.  This was extended longitudinally with Potts scissors.  The artery had a posterior plaque, the bulk of which was at the takeoff of the anterior tibial artery.  I therefore elected to make my arteriotomy down onto the tibioperoneal trunk.  The vein was then cut to the appropriate length. I then spatulated to fit the size of the arteriotomy.  I did pass several dilators up to a #3 dilator down the distal artery at this point without resistance.  I then created an end-to-side anastomosis with a running 6-0 Prolene prior to completion.  The tourniquet was let down and appropriate flush maneuvers were performed and the anastomosis was completed.  Signals were evaluated at the ankle and posterior tibial and anterior tibial arteries.  They had biphasic signals with the graft  open and went to monophasic with the graft occluded.  At this point, I was satisfied with the repair.  The patient's heparin was reversed with protamine.  The vein harvest incisions were closed in two layers of 3-0 Vicryl in the groin.  The femoral sheath was reapproximated with 2-0 Vicryl.  The subcutaneous tissue was closed with two additional layers of 3-0 Vicryl and the skin was closed with 4-0 Vicryl.  In the below- knee incision, the fascia was closed with 2-0 Vicryl, the subcutaneous tissue was closed with 3-0 Vicryl, and the skin was closed with 4-0 Vicryl.  Dermabond was placed on the wounds.  The patient tolerated the procedure well and was taken to the  recovery room in stable condition after successful extubation.     Eldridge Abrahams, MD     VWB/MEDQ  D:  06/30/2011  T:  07/01/2011  Job:  [id number]  Electronically Signed by Orvan Falconer IV MD on 07/18/2011 12:04:23 AM

## 2011-08-07 LAB — DIFFERENTIAL
Basophils Absolute: 0
Eosinophils Absolute: 0.2
Eosinophils Relative: 2
Lymphocytes Relative: 29
Monocytes Absolute: 0.5

## 2011-08-07 LAB — URINE MICROSCOPIC-ADD ON

## 2011-08-07 LAB — COMPREHENSIVE METABOLIC PANEL
ALT: 21
AST: 19
Albumin: 4.3
Alkaline Phosphatase: 87
Chloride: 96
Creatinine, Ser: 0.49
GFR calc Af Amer: >60
Potassium: 3.9
Sodium: 133 — ABNORMAL LOW
Total Bilirubin: 0.4

## 2011-08-07 LAB — TYPE AND SCREEN
ABO/RH(D): O NEG
Antibody Screen: NEGATIVE

## 2011-08-07 LAB — CBC
Platelets: 237
RBC: 5.9 — ABNORMAL HIGH
WBC: 10.4

## 2011-08-07 LAB — URINALYSIS, ROUTINE W REFLEX MICROSCOPIC
Hgb urine dipstick: NEGATIVE
Nitrite: NEGATIVE
Specific Gravity, Urine: 1.03
Urobilinogen, UA: 0.2
pH: 5

## 2011-08-07 LAB — ABO/RH: ABO/RH(D): O NEG

## 2011-08-07 LAB — VITAMIN D 25 HYDROXY (VIT D DEFICIENCY, FRACTURES): Vit D, 25-Hydroxy: 14 — ABNORMAL LOW (ref 30–89)

## 2011-08-08 LAB — BASIC METABOLIC PANEL
BUN: 3 — ABNORMAL LOW
BUN: 5 — ABNORMAL LOW
CO2: 27
CO2: 30
Calcium: 8.6
Calcium: 8.8
Calcium: 9
Chloride: 96
GFR calc Af Amer: >60
GFR calc non Af Amer: >60
GFR calc non Af Amer: >60
Glucose, Bld: 201 — ABNORMAL HIGH
Glucose, Bld: 275 — ABNORMAL HIGH
Glucose, Bld: 276 — ABNORMAL HIGH
Potassium: 3.2 — ABNORMAL LOW
Potassium: 3.5
Sodium: 130 — ABNORMAL LOW
Sodium: 131 — ABNORMAL LOW
Sodium: 133 — ABNORMAL LOW

## 2011-08-08 LAB — CBC
HCT: 44.8
Hemoglobin: 13.2
Hemoglobin: 14.2
Hemoglobin: 15.5 — ABNORMAL HIGH
MCHC: 34.4
MCHC: 34.6
MCV: 87.1
Platelets: 190
Platelets: 200
RBC: 5.15 — ABNORMAL HIGH
RDW: 12.7
RDW: 12.8
RDW: 12.8
WBC: 10.4

## 2011-08-08 LAB — POCT I-STAT GLUCOSE
Glucose, Bld: 234 — ABNORMAL HIGH
Glucose, Bld: 260 — ABNORMAL HIGH
Operator id: 136451

## 2011-08-08 LAB — POCT I-STAT 4, (NA,K, GLUC, HGB,HCT)
Glucose, Bld: 269 — ABNORMAL HIGH
HCT: 47 — ABNORMAL HIGH

## 2011-08-09 ENCOUNTER — Encounter: Payer: Self-pay | Admitting: Surgery

## 2011-08-12 ENCOUNTER — Ambulatory Visit (INDEPENDENT_AMBULATORY_CARE_PROVIDER_SITE_OTHER): Payer: Medicare Other | Admitting: Surgery

## 2011-08-12 ENCOUNTER — Encounter: Payer: Self-pay | Admitting: Surgery

## 2011-08-12 VITALS — BP 112/75 | HR 82 | Temp 97.8°F | Wt 178.0 lb

## 2011-08-12 DIAGNOSIS — I739 Peripheral vascular disease, unspecified: Secondary | ICD-10-CM

## 2011-08-12 NOTE — Progress Notes (Signed)
The patient comes back today for followup. She is status post left common femoral to below knee popliteal artery bypass graft with reversed ipsilateral translocated saphenous vein on 06/26/2009. This was done for nonhealing wound on the lateral aspect of her left foot. She has a history of a right femoral below-knee popliteal bypass graft with vein for nonhealing wound. She is back today for followup. She due to wound care from Dr. Nils Pyle. She is placing silver on her ulcer and changing every 3 days  On examination there is some dry eschar on her inguinal incision which I debrided today. The underlying tissues is healthy. All of her other incisions are well healed. There is a large ulcer on the lateral side of the fifth metatarsal head which is clean in appearance however there is a foul odor. She has a good dorsalis pedis Doppler signal. There is a mild amount of edema.  Overall I think she is doing very well she will continue to get her wound care at the wound center. Because of the odor associated with this I recommend changing this a little more frequently. I'll plan on seeing her back in the office in 6 weeks with a vascular lab study

## 2011-08-28 LAB — CREATININE, SERUM
Creatinine, Ser: 0.53
GFR calc non Af Amer: >60

## 2011-09-23 ENCOUNTER — Ambulatory Visit: Payer: Medicare Other | Admitting: Surgery

## 2011-10-25 ENCOUNTER — Encounter: Payer: Self-pay | Admitting: Surgery

## 2011-10-28 ENCOUNTER — Ambulatory Visit (INDEPENDENT_AMBULATORY_CARE_PROVIDER_SITE_OTHER): Payer: Medicare Other | Admitting: Surgery

## 2011-10-28 ENCOUNTER — Encounter: Payer: Self-pay | Admitting: Surgery

## 2011-10-28 VITALS — BP 91/61 | HR 87 | Resp 16 | Ht 66.0 in | Wt 174.0 lb

## 2011-10-28 DIAGNOSIS — I7092 Chronic total occlusion of artery of the extremities: Secondary | ICD-10-CM

## 2011-10-28 NOTE — Progress Notes (Signed)
[facility] [place]   Patient name: [name] MRN: [id number] DOB: 03/16/1966 Sex: female     Chief Complaint  Patient presents with  . PAD    ? medication given in [place]: The patient is here today for followup. She is status post left femoral to below knee popliteal artery bypass graft with non-reversed vein in August. This was done for nonhealing wound. She's here today for followup. She continues to have a small wound on the lateral side of her fifth metatarsal head on the left. She has no other complaints. All her incisions are healed.  Past Medical History  Diagnosis Date  . Diabetes mellitus   . PAD (peripheral artery disease)   . Chronic back pain   . HTN (hypertension)   . Asthma   . Hyperlipidemia   . Leg pain     With Walking  . Depression   . Arthritis   . Ulcer     Foot  . Reflux   . Joint pain     Past Surgical History  Procedure Date  . Arterial bypass surgry 07/05/2010    Right Common Femoral to below knee popliteal BPG  . Skin graft 2012    [name] by Dr. Nils Pyle    History   Social History  . Marital Status: Married    Spouse Name: N/A    Number of Children: N/A  . Years of Education: N/A   Occupational History  . Not on file.   Social History Main Topics  . Smoking status: Current Everyday Smoker -- 0.5 packs/day for 30 years    Types: Cigarettes  . Smokeless tobacco: Not on file  . Alcohol Use: No  . Drug Use: No  . Sexually Active: Not on file   Other Topics Concern  . Not on file   Social History Narrative  . No narrative on file    Family History  Problem Relation Age of Onset  . Coronary artery disease Mother   . Peripheral vascular disease Mother   . Heart disease Mother   . Other Mother     Venous insuffiency  . Heart disease Father   . Diabetes Sister   . Hypertension Sister   . Diabetes Brother   . Hypertension Brother     Allergies as of 10/28/2011 -  Review Complete 10/28/2011  Allergen Reaction Noted  . Penicillins  06/24/2011    Current Outpatient Prescriptions on File Prior to Visit  Medication Sig Dispense Refill  . aspirin 325 MG tablet Take 325 mg by mouth daily.        . diazepam (VALIUM) 10 MG tablet Take 10 mg by mouth every 6 (six) hours as needed.        . furosemide (LASIX) 20 MG tablet Take 20 mg by mouth 3 (three) times daily.        Marland Kitchen glimepiride (AMARYL) 2 MG tablet       . HYDROcodone-acetaminophen (LORTAB) 10-500 MG per tablet       . insulin glargine (LANTUS) 100 UNIT/ML injection Inject 15 Units into the skin at bedtime.        Marland Kitchen LYRICA 100 MG capsule 100 mg 3 (three) times daily.       . metFORMIN (GLUCOPHAGE) 1000 MG tablet Take 1,000 mg by mouth 2 (two) times daily with a meal.        . methocarbamol (ROBAXIN) 500 MG tablet       .  morphine (MS CONTIN) 15 MG 12 hr tablet Prn only      . ciprofloxacin (CIPRO) 500 MG tablet          REVIEW OF SYSTEMS: No changes. She denies chest pain shortness of breath. There is still some left fifth toe positive for left leg edema  PHYSICAL EXAMINATION:   Vital signs are BP 91/61  Pulse 87  Resp 16  Ht 5\' 6"  (1.676 m)  Wt 174 lb (78.926 kg)  BMI 28.08 kg/m2  SpO2 93% General: The patient appears their stated age. HEENT:  No gross abnormalities Pulmonary:  Non labored breathing Musculoskeletal: There are no major deformities. Neurologic: No focal weakness or paresthesias are detected, Skin: Small ulcer on the lateral side of the left fifth metatarsal head. There is no evidence of infection there is no foul odor there is no drainage. Psychiatric: The patient has normal affect. Cardiovascular: Persistent edema in the left leg. Pedal pulses are nonpalpable.   Diagnostic Studies None performed today  Assessment: Status post left femoral below-knee popliteal bypass graft for ulcer. Plan: Patient will continue with wound care. Nerve wound looks very clean and there  does appear to be getting smaller. The patient is supposed to have her ultrasound today however this did not happen. She can come back later in the week to get this done. I'll plan on seeing her back in 3 months with bilateral lower sternum the ultrasound to evaluate both bypass grafts in each extremity  V. Leia Alf, M.D. [facility] [place] Office: [phone] Pager:  [phone]

## 2011-11-01 ENCOUNTER — Other Ambulatory Visit: Payer: Medicare Other

## 2011-11-11 ENCOUNTER — Other Ambulatory Visit: Payer: Self-pay

## 2011-11-11 DIAGNOSIS — Z48812 Encounter for surgical aftercare following surgery on the circulatory system: Secondary | ICD-10-CM

## 2011-11-11 DIAGNOSIS — I739 Peripheral vascular disease, unspecified: Secondary | ICD-10-CM

## 2011-11-21 ENCOUNTER — Other Ambulatory Visit: Payer: Medicare Other

## 2011-11-27 ENCOUNTER — Other Ambulatory Visit (INDEPENDENT_AMBULATORY_CARE_PROVIDER_SITE_OTHER): Payer: Medicare Other | Admitting: *Deleted

## 2011-11-27 DIAGNOSIS — I739 Peripheral vascular disease, unspecified: Secondary | ICD-10-CM

## 2011-11-27 DIAGNOSIS — I70409 Unspecified atherosclerosis of autologous vein bypass graft(s) of the extremities, unspecified extremity: Secondary | ICD-10-CM

## 2011-11-27 DIAGNOSIS — Z48812 Encounter for surgical aftercare following surgery on the circulatory system: Secondary | ICD-10-CM

## 2011-12-23 NOTE — Procedures (Unsigned)
BYPASS GRAFT EVALUATION  INDICATION:  Follow up left lower extremity bypass graft.  HISTORY: Diabetes:  Yes. Cardiac: Hypertension:  No. Smoking:  Currently. Previous Surgery:  Right femoral to popliteal bypass graft, 07/05/2010. Left femoral to popliteal below-knee bypass graft, 06/27/2011.  SINGLE LEVEL ARTERIAL EXAM                              RIGHT              LEFT Brachial:                    92                 68 monophasic Anterior tibial:             73                 89 Posterior tibial:            81                 78 Peroneal: Ankle/brachial index:        0.88               0.97  PREVIOUS ABI:  Date: 04/05/11  RIGHT:  0.87  LEFT:  0.59  LOWER EXTREMITY BYPASS GRAFT DUPLEX EXAM:  DUPLEX:  Patent left femoral-to-popliteal bypass graft with increased velocities at the proximal anastomosis.  Brisk monophasic waveforms noted throughout.  IMPRESSION: 1. Patent left femoral to below knee popliteal bypass graft with     elevated velocities, as noted above. 2. Stable right ankle brachial index. 3. Increase in left ankle brachial index; however, may be falsely     elevated due to calcified vessels.  ___________________________________________ V. Leia Alf, MD  SS/MEDQ  D:  11/27/2011  T:  11/27/2011  Job:  [id number]

## 2012-01-27 ENCOUNTER — Other Ambulatory Visit: Payer: Medicare Other

## 2012-02-07 ENCOUNTER — Encounter: Payer: Self-pay | Admitting: Surgery

## 2012-02-10 ENCOUNTER — Encounter (INDEPENDENT_AMBULATORY_CARE_PROVIDER_SITE_OTHER): Payer: Medicare Other | Admitting: *Deleted

## 2012-02-10 ENCOUNTER — Ambulatory Visit (INDEPENDENT_AMBULATORY_CARE_PROVIDER_SITE_OTHER): Payer: Medicare Other | Admitting: Surgery

## 2012-02-10 ENCOUNTER — Encounter: Payer: Self-pay | Admitting: Surgery

## 2012-02-10 VITALS — BP 105/65 | HR 82 | Resp 16 | Ht 66.0 in | Wt 178.0 lb

## 2012-02-10 DIAGNOSIS — I70409 Unspecified atherosclerosis of autologous vein bypass graft(s) of the extremities, unspecified extremity: Secondary | ICD-10-CM

## 2012-02-10 DIAGNOSIS — Z48812 Encounter for surgical aftercare following surgery on the circulatory system: Secondary | ICD-10-CM

## 2012-02-10 DIAGNOSIS — I739 Peripheral vascular disease, unspecified: Secondary | ICD-10-CM | POA: Insufficient documentation

## 2012-02-10 DIAGNOSIS — I7092 Chronic total occlusion of artery of the extremities: Secondary | ICD-10-CM | POA: Insufficient documentation

## 2012-02-10 NOTE — Progress Notes (Signed)
[facility]    Patient name: [name] MRN: [id number] DOB: 05-16-1966 Sex: female     Chief Complaint  Patient presents with  . Chron. Total  . PAD    LE Arterial 3 month with Labs    HISTORY OF PRESENT ILLNESS: The patient comes back today for followup. She is status post right femoral to below knee popliteal artery bypass graft with vein on 07/05/2010 for nonhealing ulcer, and left femoral to below knee popliteal artery bypass graft with vein on 06/27/2011 she is back today for followup. She still seeing the wound center for wound on her right foot which has been chronic. The wound on the left fifth metatarsal head has healed. Overall she is doing well from a vascular perspective she still complains of hip pain.  Past Medical History  Diagnosis Date  . Diabetes mellitus   . PAD (peripheral artery disease)   . Chronic back pain   . HTN (hypertension)   . Asthma   . Hyperlipidemia   . Leg pain     With Walking  . Depression   . Arthritis   . Ulcer     Foot  . Reflux   . Joint pain     Past Surgical History  Procedure Date  . Arterial bypass surgry 07/05/2010    Right Common Femoral to below knee popliteal BPG  . Skin graft 2012    [name] by Dr. Nils Pyle    History   Social History  . Marital Status: Married    Spouse Name: N/A    Number of Children: N/A  . Years of Education: N/A   Occupational History  . Not on file.   Social History Main Topics  . Smoking status: Current Everyday Smoker -- 0.5 packs/day for 30 years    Types: Cigarettes  . Smokeless tobacco: Not on file  . Alcohol Use: No  . Drug Use: No  . Sexually Active: Not on file   Other Topics Concern  . Not on file   Social History Narrative  . No narrative on file    Family History  Problem Relation Age of Onset  . Coronary artery disease Mother   . Peripheral vascular disease Mother   . Heart disease Mother   . Other Mother     Venous insuffiency  .  Heart disease Father   . Diabetes Sister   . Hypertension Sister   . Diabetes Brother   . Hypertension Brother     Allergies as of 02/10/2012 - Review Complete 02/10/2012  Allergen Reaction Noted  . Penicillins  06/24/2011    Current Outpatient Prescriptions on File Prior to Visit  Medication Sig Dispense Refill  . aspirin 325 MG tablet Take 325 mg by mouth daily.        . ciprofloxacin (CIPRO) 500 MG tablet       . diazepam (VALIUM) 10 MG tablet Take 10 mg by mouth every 6 (six) hours as needed.        . furosemide (LASIX) 20 MG tablet Take 20 mg by mouth 3 (three) times daily.        Marland Kitchen glimepiride (AMARYL) 2 MG tablet       . HYDROcodone-acetaminophen (LORTAB) 10-500 MG per tablet       . insulin glargine (LANTUS) 100 UNIT/ML injection Inject 15 Units into the skin at bedtime.        Marland Kitchen LYRICA 100 MG capsule 100 mg 3 (three) times daily.       Marland Kitchen  metFORMIN (GLUCOPHAGE) 1000 MG tablet Take 1,000 mg by mouth 2 (two) times daily with a meal.        . methocarbamol (ROBAXIN) 500 MG tablet       . morphine (MS CONTIN) 15 MG 12 hr tablet Prn only         REVIEW OF SYSTEMS: All systems are negative except as documented in the history of present illness. There is no change from prior visit PHYSICAL EXAMINATION:   Vital signs are BP 105/65  Pulse 82  Resp 16  Ht 5\' 6"  (1.676 m)  Wt 178 lb (80.74 kg)  BMI 28.73 kg/m2  SpO2 100% General: The patient appears their stated age. HEENT:  No gross abnormalities Pulmonary:  Non labored breathing Abdomen: Soft and non-tender Musculoskeletal: There are no major deformities. Neurologic: No focal weakness or paresthesias are detected, Skin: There are no ulcer or rashes noted. Psychiatric: The patient has normal affect Cardiovascular: Bilateral edema left greater than right, pulses are not palpable  Diagnostic Studies Today her ABIs were 1.0 bilaterally. On duplex velocities in the right groin at the proximal anastomosis are 2 63  cm/s  Assessment: Status post bilateral femoral below-knee popliteal bypass graft with vein 4 ulcers. Plan: The patient has been stapler. There are elevated velocities in the right proximal anastomosis however these are not hemodynamically significant. Her wounds have nearly healed. I'll plan on continuous routine surveillance ultrasound. Her next ultrasound will be in 6 months and I'll see her back in one year. I did give her a prescription for 15-20 mm compression he had stockings for edema.  Eldridge Abrahams, M.D. [facility] Office: [phone] Pager:  [phone]

## 2012-02-24 NOTE — Procedures (Unsigned)
BYPASS GRAFT EVALUATION  INDICATION:  Followup bilateral lower extremity bypass grafts.  HISTORY: Diabetes:  Yes Cardiac:  No Hypertension:  Yes Smoking:  Yes Previous Surgery:  Right femoral to popliteal bypass graft, 07/05/2010. Left femoral to below knee popliteal bypass graft, 06/27/2011.  SINGLE LEVEL ARTERIAL EXAM                              RIGHT              LEFT Brachial: Anterior tibial: Posterior tibial: Peroneal: Ankle/brachial index:  PREVIOUS ABI:  Date:  11/27/2011  RIGHT:  0.88  LEFT:  0.97 Date:  02/10/2012  RIGHT:  1.00  LEFT:  1.01  LOWER EXTREMITY BYPASS GRAFT DUPLEX EXAM:  DUPLEX:  Right:  Increased velocity at the proximal anastomosis (263 cm/s) and at the distal anastomosis (243 cm/s). Left:  Increased velocity at the proximal anastomosis (260 cm/s).  IMPRESSION:  Patent bilateral femoral to popliteal bypass grafts with velocities as noted above.     ___________________________________________ V. Leia Alf, MD  SS/MEDQ  D:  02/10/2012  T:  02/10/2012  Job:  [id number]

## 2012-08-06 ENCOUNTER — Other Ambulatory Visit: Payer: Self-pay | Admitting: *Deleted

## 2012-08-06 DIAGNOSIS — Z48812 Encounter for surgical aftercare following surgery on the circulatory system: Secondary | ICD-10-CM

## 2012-08-06 DIAGNOSIS — I739 Peripheral vascular disease, unspecified: Secondary | ICD-10-CM

## 2012-08-12 ENCOUNTER — Encounter (INDEPENDENT_AMBULATORY_CARE_PROVIDER_SITE_OTHER): Payer: Medicare Other | Admitting: *Deleted

## 2012-08-12 DIAGNOSIS — Z48812 Encounter for surgical aftercare following surgery on the circulatory system: Secondary | ICD-10-CM

## 2012-08-12 DIAGNOSIS — I739 Peripheral vascular disease, unspecified: Secondary | ICD-10-CM

## 2013-01-14 ENCOUNTER — Encounter (HOSPITAL_COMMUNITY): Payer: Self-pay | Admitting: *Deleted

## 2013-01-14 ENCOUNTER — Emergency Department (HOSPITAL_COMMUNITY)
Admission: EM | Admit: 2013-01-14 | Discharge: 2013-01-14 | Disposition: A | Discharge status: Home / Self Care | Payer: Medicare Other | Attending: Emergency Medicine | Admitting: Emergency Medicine

## 2013-01-14 DIAGNOSIS — Z8679 Personal history of other diseases of the circulatory system: Secondary | ICD-10-CM | POA: Insufficient documentation

## 2013-01-14 DIAGNOSIS — R059 Cough, unspecified: Secondary | ICD-10-CM | POA: Insufficient documentation

## 2013-01-14 DIAGNOSIS — J45909 Unspecified asthma, uncomplicated: Secondary | ICD-10-CM | POA: Insufficient documentation

## 2013-01-14 DIAGNOSIS — H9190 Unspecified hearing loss, unspecified ear: Secondary | ICD-10-CM | POA: Insufficient documentation

## 2013-01-14 DIAGNOSIS — H9209 Otalgia, unspecified ear: Secondary | ICD-10-CM | POA: Insufficient documentation

## 2013-01-14 DIAGNOSIS — M129 Arthropathy, unspecified: Secondary | ICD-10-CM | POA: Insufficient documentation

## 2013-01-14 DIAGNOSIS — Z79899 Other long term (current) drug therapy: Secondary | ICD-10-CM | POA: Insufficient documentation

## 2013-01-14 DIAGNOSIS — Z872 Personal history of diseases of the skin and subcutaneous tissue: Secondary | ICD-10-CM | POA: Insufficient documentation

## 2013-01-14 DIAGNOSIS — Z862 Personal history of diseases of the blood and blood-forming organs and certain disorders involving the immune mechanism: Secondary | ICD-10-CM | POA: Insufficient documentation

## 2013-01-14 DIAGNOSIS — J3489 Other specified disorders of nose and nasal sinuses: Secondary | ICD-10-CM | POA: Insufficient documentation

## 2013-01-14 DIAGNOSIS — J329 Chronic sinusitis, unspecified: Secondary | ICD-10-CM | POA: Insufficient documentation

## 2013-01-14 DIAGNOSIS — G8929 Other chronic pain: Secondary | ICD-10-CM | POA: Insufficient documentation

## 2013-01-14 DIAGNOSIS — I10 Essential (primary) hypertension: Secondary | ICD-10-CM | POA: Insufficient documentation

## 2013-01-14 DIAGNOSIS — K219 Gastro-esophageal reflux disease without esophagitis: Secondary | ICD-10-CM | POA: Insufficient documentation

## 2013-01-14 DIAGNOSIS — Z8659 Personal history of other mental and behavioral disorders: Secondary | ICD-10-CM | POA: Insufficient documentation

## 2013-01-14 DIAGNOSIS — Z951 Presence of aortocoronary bypass graft: Secondary | ICD-10-CM | POA: Insufficient documentation

## 2013-01-14 DIAGNOSIS — M549 Dorsalgia, unspecified: Secondary | ICD-10-CM | POA: Insufficient documentation

## 2013-01-14 DIAGNOSIS — E119 Type 2 diabetes mellitus without complications: Secondary | ICD-10-CM | POA: Insufficient documentation

## 2013-01-14 DIAGNOSIS — F17200 Nicotine dependence, unspecified, uncomplicated: Secondary | ICD-10-CM | POA: Insufficient documentation

## 2013-01-14 DIAGNOSIS — Z8639 Personal history of other endocrine, nutritional and metabolic disease: Secondary | ICD-10-CM | POA: Insufficient documentation

## 2013-01-14 LAB — RAPID STREP SCREEN (MED CTR MEBANE ONLY): Streptococcus, Group A Screen (Direct): NEGATIVE

## 2013-01-14 MED ORDER — HYDROCOD POLST-CHLORPHEN POLST 10-8 MG/5ML PO LQCR
5.0000 mL | Freq: Once | ORAL | Status: AC
  Administered 2013-01-14: 5 mL via ORAL
  Filled 2013-01-14: qty 5

## 2013-01-14 MED ORDER — CEPHALEXIN 500 MG PO CAPS: 500.0000 mg | ORAL_CAPSULE | Freq: Four times a day (QID) | ORAL | Status: DC

## 2013-01-14 NOTE — ED Provider Notes (Signed)
History     CSN: [id number]  Arrival date & time 01/14/13  2003   First MD Initiated Contact with Patient 01/14/13 2112      Chief Complaint  Patient presents with  . Sore Throat    (Consider location/radiation/quality/duration/timing/severity/associated sxs/prior treatment) Patient is a 47 y.o. female presenting with pharyngitis. The history is provided by the patient.  Sore Throat This is a new problem. The current episode started in the past 7 days. The problem occurs constantly. The problem has been unchanged. Associated symptoms include congestion, coughing and a sore throat. Pertinent negatives include no abdominal pain, arthralgias, chest pain, chills, diaphoresis, fatigue, fever, headaches, joint swelling, myalgias, nausea, neck pain, numbness, rash, swollen glands, vertigo, visual change, vomiting or weakness. Associated symptoms comments: Right ear pain, decreased hearing on right, sinus pressure and pain. Nothing aggravates the symptoms. She has tried nothing for the symptoms. The treatment provided no relief.    Past Medical History  Diagnosis Date  . Diabetes mellitus   . PAD (peripheral artery disease)   . Chronic back pain   . HTN (hypertension)   . Asthma   . Hyperlipidemia   . Leg pain     With Walking  . Depression   . Arthritis   . Ulcer     Foot  . Reflux   . Joint pain     Past Surgical History  Procedure Laterality Date  . Arterial bypass surgry  07/05/2010    Right Common Femoral to below knee popliteal BPG  . Skin graft  2012    [name] by Dr. Nils Pyle  . Cholecystectomy    . Back surgery    . Tonsillectomy      Family History  Problem Relation Age of Onset  . Coronary artery disease Mother   . Peripheral vascular disease Mother   . Heart disease Mother   . Other Mother     Venous insuffiency  . Heart disease Father   . Diabetes Sister   . Hypertension Sister   . Diabetes Brother   . Hypertension Brother     History  Substance Use  Topics  . Smoking status: Current Every Day Smoker -- 0.50 packs/day for 30 years    Types: Cigarettes  . Smokeless tobacco: Not on file  . Alcohol Use: No    OB History   Grav Para Term Preterm Abortions TAB SAB Ect Mult Living                  Review of Systems  Constitutional: Negative for fever, chills, diaphoresis, activity change, appetite change and fatigue.  HENT: Positive for hearing loss, ear pain, congestion, sore throat and sinus pressure. Negative for nosebleeds, facial swelling, trouble swallowing, neck pain and neck stiffness.   Respiratory: Positive for cough. Negative for chest tightness and wheezing.   Cardiovascular: Negative for chest pain.  Gastrointestinal: Negative for nausea, vomiting and abdominal pain.  Musculoskeletal: Negative for myalgias, joint swelling and arthralgias.  Skin: Negative for rash.  Neurological: Negative for dizziness, vertigo, weakness, numbness and headaches.  Hematological: Negative for adenopathy.    Allergies  Penicillins  Home Medications   Current Outpatient Rx  Name  Route  Sig  Dispense  Refill  . albuterol (PROVENTIL) 2 MG tablet   Oral   Take 2 mg by mouth 3 (three) times daily.         Marland Kitchen aspirin 325 MG tablet   Oral   Take 325 mg by mouth daily as  needed for pain.          . furosemide (LASIX) 40 MG tablet   Oral   Take 40 mg by mouth 2 (two) times daily.         Marland Kitchen glimepiride (AMARYL) 2 MG tablet   Oral   Take 2 mg by mouth 2 (two) times daily.          Marland Kitchen HYDROcodone-acetaminophen (NORCO) 10-325 MG per tablet   Oral   Take 2 tablets by mouth every 8 (eight) hours as needed for pain.         Marland Kitchen LYRICA 100 MG capsule      100 mg 3 (three) times daily.          . methocarbamol (ROBAXIN) 500 MG tablet   Oral   Take 500 mg by mouth 3 (three) times daily as needed (for muscle spasms).          Marland Kitchen omeprazole (PRILOSEC) 40 MG capsule   Oral   Take 40 mg by mouth daily.         Marland Kitchen morphine  (MS CONTIN) 15 MG 12 hr tablet      Prn only           BP 79/61  Pulse 88  Temp(Src) 97.4 F (36.3 C) (Oral)  Resp 20  Ht 5\' 6"  (1.676 m)  Wt 168 lb (76.204 kg)  BMI 27.13 kg/m2  SpO2 97%  Physical Exam  Nursing note and vitals reviewed. Constitutional: She is oriented to person, place, and time. She appears well-developed and well-nourished. No distress.  HENT:  Head: Normocephalic and atraumatic. No trismus in the jaw.  Right Ear: Ear canal normal. No drainage, swelling or tenderness. No mastoid tenderness. Tympanic membrane is not perforated, not erythematous, not retracted and not bulging. No hemotympanum. Decreased hearing is noted.  Left Ear: Tympanic membrane and ear canal normal.  Nose: Mucosal edema and rhinorrhea present. Right sinus exhibits maxillary sinus tenderness. Right sinus exhibits no frontal sinus tenderness. Left sinus exhibits maxillary sinus tenderness. Left sinus exhibits no frontal sinus tenderness.  Mouth/Throat: Uvula is midline and mucous membranes are normal. No edematous. Posterior oropharyngeal erythema present. No oropharyngeal exudate, posterior oropharyngeal edema or tonsillar abscesses.  Air fluids levels seen behind right TM.  No erythema, bulging or perforation  Neck: Normal range of motion and phonation normal. Neck supple. No Brudzinski's sign and no Kernig's sign noted.  Cardiovascular: Normal rate, regular rhythm, normal heart sounds and intact distal pulses.   No murmur heard. Pulmonary/Chest: Effort normal and breath sounds normal. She has no wheezes. She has no rales.  Abdominal: Soft. Bowel sounds are normal.  Musculoskeletal: She exhibits no edema and no tenderness.  Lymphadenopathy:    She has no cervical adenopathy.  Neurological: She is alert and oriented to person, place, and time. She exhibits normal muscle tone. Coordination normal.  Skin: Skin is warm and dry.    ED Course  Procedures (including critical care  time)  Results for orders placed during the hospital encounter of 01/14/13  RAPID STREP SCREEN      Result Value Range   Streptococcus, Group A Screen (Direct) NEGATIVE  NEGATIVE        MDM     Patient reports that her BP is normallly low.  She is ambulatory, no focal neuro deficits on exam.  Ambulates with a steady gait.  Recheck of BP improved.  She is non-toxic appearing.    Will treat with Keflex.  Agrees to close f/u with her PMD.    Kinsey Karch L. Gardner, [place] 01/17/13 0025

## 2013-01-14 NOTE — ED Notes (Signed)
Sore throat,  Hoarse. Sinus drainage.  Decreased hearing on rt

## 2013-01-14 NOTE — ED Notes (Signed)
When pt asked about decreased B/P, pt reports b/p is always that low and "not going to have an IV placed." Pt is alert and oriented, denies fatigue and dizziness at this time.

## 2013-01-17 NOTE — ED Provider Notes (Signed)
Medical screening examination/treatment/procedure(s) were performed by non-physician practitioner and as supervising physician I was immediately available for consultation/collaboration.   Mervin Kung, MD 01/17/13 [phone]

## 2013-01-28 ENCOUNTER — Other Ambulatory Visit: Payer: Self-pay | Admitting: *Deleted

## 2013-01-28 DIAGNOSIS — Z48812 Encounter for surgical aftercare following surgery on the circulatory system: Secondary | ICD-10-CM

## 2013-01-28 DIAGNOSIS — I739 Peripheral vascular disease, unspecified: Secondary | ICD-10-CM

## 2013-02-05 ENCOUNTER — Encounter: Payer: Self-pay | Admitting: Surgery

## 2013-02-08 ENCOUNTER — Ambulatory Visit: Payer: Medicare Other | Admitting: Surgery

## 2013-03-29 ENCOUNTER — Ambulatory Visit: Payer: Self-pay | Admitting: Surgery

## 2013-04-02 ENCOUNTER — Encounter: Payer: Self-pay | Admitting: Surgery

## 2013-04-05 ENCOUNTER — Encounter (INDEPENDENT_AMBULATORY_CARE_PROVIDER_SITE_OTHER): Payer: Medicare Other | Admitting: *Deleted

## 2013-04-05 ENCOUNTER — Ambulatory Visit (INDEPENDENT_AMBULATORY_CARE_PROVIDER_SITE_OTHER): Payer: Medicare Other | Admitting: Surgery

## 2013-04-05 ENCOUNTER — Encounter: Payer: Self-pay | Admitting: Surgery

## 2013-04-05 VITALS — BP 100/65 | HR 101 | Resp 16 | Ht 66.0 in | Wt 166.0 lb

## 2013-04-05 DIAGNOSIS — Z48812 Encounter for surgical aftercare following surgery on the circulatory system: Secondary | ICD-10-CM

## 2013-04-05 DIAGNOSIS — R42 Dizziness and giddiness: Secondary | ICD-10-CM | POA: Insufficient documentation

## 2013-04-05 DIAGNOSIS — I739 Peripheral vascular disease, unspecified: Secondary | ICD-10-CM

## 2013-04-05 NOTE — Progress Notes (Signed)
[facility]   Patient name: [name] MRN: [id number] DOB: 17-Oct-1966 Sex: female     Chief Complaint  Patient presents with  . PAD    One year f/up with vascular lab study.  Off/on dizziness 3-4 mo duration.    HISTORY OF PRESENT ILLNESS: The patient comes back today for followup. She is status post right femoral to below knee popliteal artery bypass graft with vein on 07/05/2010 for nonhealing ulcer, and left femoral to below knee popliteal artery bypass graft with vein on 06/27/2011 she is back today for followup she has no complaints today. All of her wounds have healed. She has minimal swelling in each leg.   Past Medical History  Diagnosis Date  . Diabetes mellitus   . PAD (peripheral artery disease)   . Chronic back pain   . HTN (hypertension)   . Asthma   . Hyperlipidemia   . Leg pain     With Walking  . Depression   . Arthritis   . Ulcer     Foot  . Reflux   . Joint pain     Past Surgical History  Procedure Laterality Date  . Arterial bypass surgry  07/05/2010    Right Common Femoral to below knee popliteal BPG  . Skin graft  2012    [name] by Dr. Nils Pyle  . Cholecystectomy    . Back surgery    . Tonsillectomy      History   Social History  . Marital Status: Married    Spouse Name: N/A    Number of Children: N/A  . Years of Education: N/A   Occupational History  . Not on file.   Social History Main Topics  . Smoking status: Current Every Day Smoker -- 0.50 packs/day for 30 years    Types: Cigarettes  . Smokeless tobacco: Never Used  . Alcohol Use: No  . Drug Use: No  . Sexually Active: Not on file   Other Topics Concern  . Not on file   Social History Narrative  . No narrative on file    Family History  Problem Relation Age of Onset  . Coronary artery disease Mother   . Peripheral vascular disease Mother   . Heart disease Mother   . Other Mother     Venous insuffiency  . Heart disease Father   .  Diabetes Sister   . Hypertension Sister   . Diabetes Brother   . Hypertension Brother     Allergies as of 04/05/2013 - Review Complete 04/05/2013  Allergen Reaction Noted  . Penicillins  06/24/2011    Current Outpatient Prescriptions on File Prior to Visit  Medication Sig Dispense Refill  . albuterol (PROVENTIL) 2 MG tablet Take 2 mg by mouth 3 (three) times daily.      Marland Kitchen aspirin 325 MG tablet Take 325 mg by mouth daily as needed for pain.       . cephALEXin (KEFLEX) 500 MG capsule Take 1 capsule (500 mg total) by mouth 4 (four) times daily. For 10 days  40 capsule  0  . furosemide (LASIX) 40 MG tablet Take 40 mg by mouth 2 (two) times daily.      Marland Kitchen glimepiride (AMARYL) 2 MG tablet Take 2 mg by mouth 2 (two) times daily.       Marland Kitchen HYDROcodone-acetaminophen (NORCO) 10-325 MG per tablet Take 2 tablets by mouth every 8 (eight) hours as needed for pain.      Marland Kitchen  LYRICA 100 MG capsule 100 mg 3 (three) times daily.       . methocarbamol (ROBAXIN) 500 MG tablet Take 500 mg by mouth 3 (three) times daily as needed (for muscle spasms).       . morphine (MS CONTIN) 15 MG 12 hr tablet Prn only      . omeprazole (PRILOSEC) 40 MG capsule Take 40 mg by mouth daily.       No current facility-administered medications on file prior to visit.     REVIEW OF SYSTEMS: All systems negative per the patient, as documented in the encounter form  PHYSICAL EXAMINATION:   Vital signs are BP 100/65  Pulse 101  Resp 16  Ht 5\' 6"  (1.676 m)  Wt 166 lb (75.297 kg)  BMI 26.81 kg/m2  SpO2 95% General: The patient appears their stated age. HEENT:  No gross abnormalities Pulmonary:  Non labored breathing Musculoskeletal: There are no major deformities. Neurologic: No focal weakness or paresthesias are detected, Skin: There are no ulcer or rashes noted. Psychiatric: The patient has normal affect. Cardiovascular: There is a regular rate and rhythm without significant murmur appreciated. Trace edema  bilaterally   Diagnostic Studies Duplex ultrasound was ordered and reviewed. ABI on the right is 1.0 on the left is 1.1. Waveforms are biphasic. Slightly elevated velocities at the origin of the right bypass. Peak velocity is 188 cm/s.  Assessment: Status post bilateral femoral-popliteal bypass grafts Plan: The patient will continue on routine surveillance. I will plan on seeing her back in approximately one year.  Eldridge Abrahams, M.D. [facility] Office: [phone] Pager:  [phone]

## 2013-04-06 NOTE — Addendum Note (Signed)
Addended by: Mena Goes on: 04/06/2013 09:21 AM   Modules accepted: Orders

## 2013-10-11 ENCOUNTER — Encounter (HOSPITAL_COMMUNITY): Payer: Self-pay | Admitting: Emergency Medicine

## 2013-10-11 ENCOUNTER — Emergency Department (HOSPITAL_COMMUNITY)
Admission: EM | Admit: 2013-10-11 | Discharge: 2013-10-11 | Disposition: A | Discharge status: Home / Self Care | Payer: Medicare Other | Attending: Emergency Medicine | Admitting: Emergency Medicine

## 2013-10-11 ENCOUNTER — Emergency Department (HOSPITAL_COMMUNITY): Payer: Medicare Other

## 2013-10-11 DIAGNOSIS — R42 Dizziness and giddiness: Secondary | ICD-10-CM | POA: Insufficient documentation

## 2013-10-11 DIAGNOSIS — R296 Repeated falls: Secondary | ICD-10-CM | POA: Insufficient documentation

## 2013-10-11 DIAGNOSIS — Y939 Activity, unspecified: Secondary | ICD-10-CM | POA: Insufficient documentation

## 2013-10-11 DIAGNOSIS — F329 Major depressive disorder, single episode, unspecified: Secondary | ICD-10-CM | POA: Insufficient documentation

## 2013-10-11 DIAGNOSIS — Y929 Unspecified place or not applicable: Secondary | ICD-10-CM | POA: Insufficient documentation

## 2013-10-11 DIAGNOSIS — K006 Disturbances in tooth eruption: Secondary | ICD-10-CM | POA: Insufficient documentation

## 2013-10-11 DIAGNOSIS — Z951 Presence of aortocoronary bypass graft: Secondary | ICD-10-CM | POA: Insufficient documentation

## 2013-10-11 DIAGNOSIS — Z79899 Other long term (current) drug therapy: Secondary | ICD-10-CM | POA: Insufficient documentation

## 2013-10-11 DIAGNOSIS — S8000XA Contusion of unspecified knee, initial encounter: Secondary | ICD-10-CM | POA: Insufficient documentation

## 2013-10-11 DIAGNOSIS — Z880 Allergy status to penicillin: Secondary | ICD-10-CM | POA: Insufficient documentation

## 2013-10-11 DIAGNOSIS — S8001XA Contusion of right knee, initial encounter: Secondary | ICD-10-CM

## 2013-10-11 DIAGNOSIS — Z872 Personal history of diseases of the skin and subcutaneous tissue: Secondary | ICD-10-CM | POA: Insufficient documentation

## 2013-10-11 DIAGNOSIS — Z8679 Personal history of other diseases of the circulatory system: Secondary | ICD-10-CM | POA: Insufficient documentation

## 2013-10-11 DIAGNOSIS — E119 Type 2 diabetes mellitus without complications: Secondary | ICD-10-CM | POA: Insufficient documentation

## 2013-10-11 DIAGNOSIS — M549 Dorsalgia, unspecified: Secondary | ICD-10-CM | POA: Insufficient documentation

## 2013-10-11 DIAGNOSIS — K219 Gastro-esophageal reflux disease without esophagitis: Secondary | ICD-10-CM | POA: Insufficient documentation

## 2013-10-11 DIAGNOSIS — G8929 Other chronic pain: Secondary | ICD-10-CM | POA: Insufficient documentation

## 2013-10-11 DIAGNOSIS — Z794 Long term (current) use of insulin: Secondary | ICD-10-CM | POA: Insufficient documentation

## 2013-10-11 DIAGNOSIS — Z792 Long term (current) use of antibiotics: Secondary | ICD-10-CM | POA: Insufficient documentation

## 2013-10-11 DIAGNOSIS — F17200 Nicotine dependence, unspecified, uncomplicated: Secondary | ICD-10-CM | POA: Insufficient documentation

## 2013-10-11 DIAGNOSIS — M129 Arthropathy, unspecified: Secondary | ICD-10-CM | POA: Insufficient documentation

## 2013-10-11 DIAGNOSIS — J45909 Unspecified asthma, uncomplicated: Secondary | ICD-10-CM | POA: Insufficient documentation

## 2013-10-11 DIAGNOSIS — F3289 Other specified depressive episodes: Secondary | ICD-10-CM | POA: Insufficient documentation

## 2013-10-11 NOTE — ED Notes (Signed)
Pt says she "passed out" 2 days ago and hurt her rt knee, Hx of vertigo

## 2013-10-11 NOTE — ED Provider Notes (Signed)
CSN: [id number]     Arrival date & time 10/11/13  1853 History   First MD Initiated Contact with Patient 10/11/13 1923     Chief Complaint  Patient presents with  . Knee Pain   (Consider location/radiation/quality/duration/timing/severity/associated sxs/prior Treatment) HPI Comments: [name] is a 47 y.o. Female who injured her right knee when she passed out 2 days ago. She describes feeling dizzy prior to passing out. This is not an unusual situation for her. She states that since she was unable sit down, at that time, she passed out. She does not desire to be evaluated for syncope. She has pain in her right knee since the syncope. The pain is worse when she is resting. She has mild pain when flexing the right knee, or standing on it. There are no other known modifying factors.   Patient is a 47 y.o. female presenting with knee pain. The history is provided by the patient.  Knee Pain   Past Medical History  Diagnosis Date  . Diabetes mellitus   . PAD (peripheral artery disease)   . Chronic back pain   . Asthma   . Hyperlipidemia   . Leg pain     With Walking  . Depression   . Arthritis   . Ulcer     Foot  . Reflux   . Joint pain    Past Surgical History  Procedure Laterality Date  . Arterial bypass surgry  07/05/2010    Right Common Femoral to below knee popliteal BPG  . Skin graft  2012    [name] by Dr. Nils Pyle  . Cholecystectomy    . Back surgery    . Tonsillectomy     Family History  Problem Relation Age of Onset  . Coronary artery disease Mother   . Peripheral vascular disease Mother   . Heart disease Mother   . Other Mother     Venous insuffiency  . Heart disease Father   . Diabetes Sister   . Hypertension Sister   . Diabetes Brother   . Hypertension Brother    History  Substance Use Topics  . Smoking status: Current Every Day Smoker -- 0.50 packs/day for 30 years    Types: Cigarettes  . Smokeless tobacco: Never Used  . Alcohol Use: No   OB  History   Grav Para Term Preterm Abortions TAB SAB Ect Mult Living                 Review of Systems  All other systems reviewed and are negative.    Allergies  Penicillins  Home Medications   Current Outpatient Rx  Name  Route  Sig  Dispense  Refill  . albuterol (PROVENTIL) 2 MG tablet   Oral   Take 2 mg by mouth 3 (three) times daily.         Marland Kitchen aspirin 325 MG tablet   Oral   Take 325 mg by mouth daily as needed for pain.          . cephALEXin (KEFLEX) 500 MG capsule   Oral   Take 1 capsule (500 mg total) by mouth 4 (four) times daily. For 10 days   40 capsule   0   . escitalopram (LEXAPRO) 20 MG tablet   Oral   Take 20 mg by mouth daily.         . furosemide (LASIX) 40 MG tablet   Oral   Take 40 mg by mouth 2 (two) times daily.         Marland Kitchen  glimepiride (AMARYL) 2 MG tablet   Oral   Take 2 mg by mouth 2 (two) times daily.          Marland Kitchen griseofulvin (GRIS-PEG) 250 MG tablet   Oral   Take 1 tablet by mouth daily.         Marland Kitchen HYDROcodone-acetaminophen (NORCO) 10-325 MG per tablet   Oral   Take 2 tablets by mouth every 8 (eight) hours as needed for pain.         . INVOKANA 300 MG TABS   Oral   Take 300 mg by mouth daily.         Marland Kitchen LANTUS 100 UNIT/ML injection               . LYRICA 100 MG capsule      100 mg 3 (three) times daily.          . methocarbamol (ROBAXIN) 500 MG tablet   Oral   Take 500 mg by mouth 3 (three) times daily as needed (for muscle spasms).          . morphine (MS CONTIN) 15 MG 12 hr tablet      Prn only         . omeprazole (PRILOSEC) 40 MG capsule   Oral   Take 40 mg by mouth daily.          BP 98/72  Pulse 112  Temp(Src) 98 F (36.7 C) (Oral)  Resp 20  Ht 5\' 6"  (1.676 m)  Wt 172 lb (78.019 kg)  BMI 27.77 kg/m2  SpO2 96% Physical Exam  Nursing note and vitals reviewed. Constitutional: She is oriented to person, place, and time. She appears well-developed and well-nourished.  HENT:  Head:  Normocephalic and atraumatic.  Poor dentition  Eyes: Conjunctivae and EOM are normal. Pupils are equal, round, and reactive to light.  Neck: Normal range of motion and phonation normal. Neck supple.  Cardiovascular: Normal rate.   Pulmonary/Chest: Effort normal. She exhibits no tenderness.  Musculoskeletal:  Right knee is tender anteriorly with mild associated swelling. No right knee deformity. She resists flexion secondary to right knee pain.  Neurological: She is alert and oriented to person, place, and time. She exhibits normal muscle tone.  Skin: Skin is warm and dry.  Psychiatric: She has a normal mood and affect. Her behavior is normal. Judgment and thought content normal.    ED Course  Procedures (including critical care time) Labs Review Labs Reviewed - No data to display Imaging Review Dg Knee Complete 4 Views Right  10/11/2013   CLINICAL DATA:  Traumatic injury and pain  EXAM: RIGHT KNEE - COMPLETE 4+ VIEW  COMPARISON:  None.  FINDINGS: No acute fracture or dislocation is identified. Postsurgical changes are seen. A small joint effusion is noted. Mild vascular calcifications are seen.  IMPRESSION: No acute abnormality is noted.   Electronically Signed   By: Inez Catalina M.D.   On: 10/11/2013 19:58    EKG Interpretation   None       MDM   1. Contusion, knee, right, initial encounter    Contusion right knee, associated with a syncopal episode. No evidence for internal derangement, fracture or knee infection. She is stable for discharge home with symptomatic treatment   Nursing Notes Reviewed/ Care Coordinated, and agree without changes. Applicable Imaging Reviewed.  Interpretation of Laboratory Data incorporated into ED treatment   Plan: Home Medications- Advil; Home Treatments and Observation- knee, sleep, when necessary; return here if  the recommended treatment, does not improve the symptoms; Recommended follow up- PCP, when necessary      Richarda Blade,  MD 10/11/13 2156

## 2013-10-11 NOTE — ED Notes (Signed)
Discharge instructions reviewed with pt, questions answered. Pt verbalized understanding.  

## 2013-11-27 ENCOUNTER — Encounter (HOSPITAL_COMMUNITY): Payer: Self-pay | Admitting: Emergency Medicine

## 2013-11-27 ENCOUNTER — Emergency Department (HOSPITAL_COMMUNITY)
Admission: EM | Admit: 2013-11-27 | Discharge: 2013-11-27 | Discharge status: Against Medical Advice | Payer: Medicare Other | Attending: Emergency Medicine | Admitting: Emergency Medicine

## 2013-11-27 DIAGNOSIS — J45909 Unspecified asthma, uncomplicated: Secondary | ICD-10-CM | POA: Insufficient documentation

## 2013-11-27 DIAGNOSIS — R42 Dizziness and giddiness: Secondary | ICD-10-CM | POA: Insufficient documentation

## 2013-11-27 DIAGNOSIS — R739 Hyperglycemia, unspecified: Secondary | ICD-10-CM

## 2013-11-27 DIAGNOSIS — F17200 Nicotine dependence, unspecified, uncomplicated: Secondary | ICD-10-CM | POA: Insufficient documentation

## 2013-11-27 DIAGNOSIS — E119 Type 2 diabetes mellitus without complications: Secondary | ICD-10-CM | POA: Insufficient documentation

## 2013-11-27 LAB — GLUCOSE, CAPILLARY: GLUCOSE-CAPILLARY: 508 mg/dL — AB (ref 70–99)

## 2013-11-27 NOTE — ED Notes (Signed)
Vertigo x 3 months.  Sinus infection for past week + R tooth pain and jaw swelling.  Has been on z-pak x 2 days.  Sees Dr. Gerarda Fraction.

## 2013-11-27 NOTE — ED Provider Notes (Signed)
Pt refused MSE, ED RN notified me in ED since Pt refused to be seen at Triage and we found Pt outside on the sidewalk leaving the ED, patient is oriented to person place and time she has family with her who understand the patient appears to have capacity to refuse medical screening examination, the patient refused any ED evaluation whatsoever she refused any lab testing IV fluids treatment of elevated blood sugar she understands that hyperglycemia can lead to death or other serious medical conditions if untreated, she once again appears to have capacity to refuse a medical screening examination she states her husband forced her to come to the emergency department and she does not want to be here. Patient's husband and another lady with the patient thank the emergency department staff for trying to convince the patient to stay for an MSE. The Pt is aware she may return at any time for MSE.  Babette Relic, MD 11/28/13 1325

## 2013-11-27 NOTE — ED Notes (Signed)
Found patient walking down hallway heading for the exit.  This nurse stopped to speak with pt and she is adamant that she does not want to stay to be treated.  Dr. Stevie Kern followed this nurse to go and speak with pt who is already outside of the e.r. Waiting area.  Dr Stevie Kern asked pt several questions to determine if pt was was oriented and able to answer questions appropriately, and she was at this time.  Pt adamantly refusing any further treatment after Dr Stevie Kern explained to pt that her elevated blood sugar could be dangerous for her.  Pt was willing to sign out ama with this knowledge and left with other family members to drive her.

## 2013-12-28 ENCOUNTER — Other Ambulatory Visit: Payer: Self-pay | Admitting: Surgery

## 2013-12-28 DIAGNOSIS — I739 Peripheral vascular disease, unspecified: Secondary | ICD-10-CM

## 2013-12-28 DIAGNOSIS — Z48812 Encounter for surgical aftercare following surgery on the circulatory system: Secondary | ICD-10-CM

## 2014-04-22 ENCOUNTER — Encounter: Payer: Self-pay | Admitting: Family

## 2014-04-25 ENCOUNTER — Ambulatory Visit: Payer: Medicare Other | Admitting: Family

## 2014-04-25 ENCOUNTER — Ambulatory Visit (HOSPITAL_COMMUNITY)
Admission: RE | Admit: 2014-04-25 | Discharge: 2014-04-25 | Disposition: A | Discharge status: Home / Self Care | Payer: Medicare Other | Source: Ambulatory Visit | Attending: Surgery | Admitting: Surgery

## 2014-04-25 ENCOUNTER — Other Ambulatory Visit (HOSPITAL_COMMUNITY): Payer: Medicare Other

## 2014-04-25 ENCOUNTER — Encounter (HOSPITAL_COMMUNITY): Payer: Medicare Other

## 2014-04-25 ENCOUNTER — Ambulatory Visit (INDEPENDENT_AMBULATORY_CARE_PROVIDER_SITE_OTHER): Payer: Medicare Other | Admitting: Family

## 2014-04-25 ENCOUNTER — Ambulatory Visit (INDEPENDENT_AMBULATORY_CARE_PROVIDER_SITE_OTHER)
Admission: RE | Admit: 2014-04-25 | Discharge: 2014-04-25 | Disposition: A | Discharge status: Home / Self Care | Payer: Medicare Other | Source: Ambulatory Visit | Attending: Surgery | Admitting: Surgery

## 2014-04-25 ENCOUNTER — Encounter: Payer: Self-pay | Admitting: Family

## 2014-04-25 VITALS — BP 90/62 | HR 67 | Resp 14 | Ht 66.0 in | Wt 170.0 lb

## 2014-04-25 DIAGNOSIS — Z48812 Encounter for surgical aftercare following surgery on the circulatory system: Secondary | ICD-10-CM | POA: Insufficient documentation

## 2014-04-25 DIAGNOSIS — M79609 Pain in unspecified limb: Secondary | ICD-10-CM

## 2014-04-25 DIAGNOSIS — R29898 Other symptoms and signs involving the musculoskeletal system: Secondary | ICD-10-CM

## 2014-04-25 DIAGNOSIS — I739 Peripheral vascular disease, unspecified: Secondary | ICD-10-CM

## 2014-04-25 NOTE — Patient Instructions (Addendum)
Peripheral Vascular Disease Peripheral Vascular Disease (PVD), also called Peripheral Arterial Disease (PAD), is a circulation problem caused by cholesterol (atherosclerotic plaque) deposits in the arteries. PVD commonly occurs in the lower extremities (legs) but it can occur in other areas of the body, such as your arms. The cholesterol buildup in the arteries reduces blood flow which can cause pain and other serious problems. The presence of PVD can place a person at risk for Coronary Artery Disease (CAD).  CAUSES  Causes of PVD can be many. It is usually associated with more than one risk factor such as:   High Cholesterol.  Smoking.  Diabetes.  Lack of exercise or inactivity.  High blood pressure (hypertension).  Obesity.  Family history. SYMPTOMS   When the lower extremities are affected, patients with PVD may experience:  Leg pain with exertion or physical activity. This is called INTERMITTENT CLAUDICATION. This may present as cramping or numbness with physical activity. The location of the pain is associated with the level of blockage. For example, blockage at the abdominal level (distal abdominal aorta) may result in buttock or hip pain. Lower leg arterial blockage may result in calf pain.  As PVD becomes more severe, pain can develop with less physical activity.  In people with severe PVD, leg pain may occur at rest.  Other PVD signs and symptoms:  Leg numbness or weakness.  Coldness in the affected leg or foot, especially when compared to the other leg.  A change in leg color.  Patients with significant PVD are more prone to ulcers or sores on toes, feet or legs. These may take longer to heal or may reoccur. The ulcers or sores can become infected.  If signs and symptoms of PVD are ignored, gangrene may occur. This can result in the loss of toes or loss of an entire limb.  Not all leg pain is related to PVD. Other medical conditions can cause leg pain such  as:  Blood clots (embolism) or Deep Vein Thrombosis.  Inflammation of the blood vessels (vasculitis).  Spinal stenosis. DIAGNOSIS  Diagnosis of PVD can involve several different types of tests. These can include:  Pulse Volume Recording Method (PVR). This test is simple, painless and does not involve the use of X-rays. PVR involves measuring and comparing the blood pressure in the arms and legs. An ABI (Ankle-Brachial Index) is calculated. The normal ratio of blood pressures is 1. As this number becomes smaller, it indicates more severe disease.  < 0.95  indicates significant narrowing in one or more leg vessels.  <0.8 there will usually be pain in the foot, leg or buttock with exercise.  <0.4 will usually have pain in the legs at rest.  <0.25  usually indicates limb threatening PVD.  Doppler detection of pulses in the legs. This test is painless and checks to see if you have a pulses in your legs/feet.  A dye or contrast material (a substance that highlights the blood vessels so they show up on x-ray) may be given to help your caregiver better see the arteries for the following tests. The dye is eliminated from your body by the kidney's. Your caregiver may order blood work to check your kidney function and other laboratory values before the following tests are performed:  Magnetic Resonance Angiography (MRA). An MRA is a picture study of the blood vessels and arteries. The MRA machine uses a large magnet to produce images of the blood vessels.  Computed Tomography Angiography (CTA). A CTA is a   specialized x-ray that looks at how the blood flows in your blood vessels. An IV may be inserted into your arm so contrast dye can be injected.  Angiogram. Is a procedure that uses x-rays to look at your blood vessels. This procedure is minimally invasive, meaning a small incision (cut) is made in your groin. A small tube (catheter) is then inserted into the artery of your groin. The catheter is  guided to the blood vessel or artery your caregiver wants to examine. Contrast dye is injected into the catheter. X-rays are then taken of the blood vessel or artery. After the images are obtained, the catheter is taken out. TREATMENT  Treatment of PVD involves many interventions which may include:  Lifestyle changes:  Quitting smoking.  Exercise.  Following a low fat, low cholesterol diet.  Control of diabetes.  Foot care is very important to the PVD patient. Good foot care can help prevent infection.  Medication:  Cholesterol-lowering medicine.  Blood pressure medicine.  Anti-platelet drugs.  Certain medicines may reduce symptoms of Intermittent Claudication.  Interventional/Surgical options:  Angioplasty. An Angioplasty is a procedure that inflates a balloon in the blocked artery. This opens the blocked artery to improve blood flow.  Stent Implant. A wire mesh tube (stent) is placed in the artery. The stent expands and stays in place, allowing the artery to remain open.  Peripheral Bypass Surgery. This is a surgical procedure that reroutes the blood around a blocked artery to help improve blood flow. This type of procedure may be performed if Angioplasty or stent implants are not an option. SEEK IMMEDIATE MEDICAL CARE IF:   You develop pain or numbness in your arms or legs.  Your arm or leg turns cold, becomes blue in color.  You develop redness, warmth, swelling and pain in your arms or legs. MAKE SURE YOU:   Understand these instructions.  Will watch your condition.  Will get help right away if you are not doing well or get worse. Document Released: 12/12/2004 Document Revised: 01/27/2012 Document Reviewed: 11/08/2008 ExitCare Patient Information 2014 [employer].   Smoking Cessation Quitting smoking is important to your health and has many advantages. However, it is not always easy to quit since nicotine is a very addictive drug. Often times, people try 3  times or more before being able to quit. This document explains the best ways for you to prepare to quit smoking. Quitting takes hard work and a lot of effort, but you can do it. ADVANTAGES OF QUITTING SMOKING  You will live longer, feel better, and live better.  Your body will feel the impact of quitting smoking almost immediately.  Within 20 minutes, blood pressure decreases. Your pulse returns to its normal level.  After 8 hours, carbon monoxide levels in the blood return to normal. Your oxygen level increases.  After 24 hours, the chance of having a heart attack starts to decrease. Your breath, hair, and body stop smelling like smoke.  After 48 hours, damaged nerve endings begin to recover. Your sense of taste and smell improve.  After 72 hours, the body is virtually free of nicotine. Your bronchial tubes relax and breathing becomes easier.  After 2 to 12 weeks, lungs can hold more air. Exercise becomes easier and circulation improves.  The risk of having a heart attack, stroke, cancer, or lung disease is greatly reduced.  After 1 year, the risk of coronary heart disease is cut in half.  After 5 years, the risk of stroke falls to   the same as a nonsmoker.  After 10 years, the risk of lung cancer is cut in half and the risk of other cancers decreases significantly.  After 15 years, the risk of coronary heart disease drops, usually to the level of a nonsmoker.  If you are pregnant, quitting smoking will improve your chances of having a healthy baby.  The people you live with, especially any children, will be healthier.  You will have extra money to spend on things other than cigarettes. QUESTIONS TO THINK ABOUT BEFORE ATTEMPTING TO QUIT You may want to talk about your answers with your caregiver.  Why do you want to quit?  If you tried to quit in the past, what helped and what did not?  What will be the most difficult situations for you after you quit? How will you plan to  handle them?  Who can help you through the tough times? Your family? Friends? A caregiver?  What pleasures do you get from smoking? What ways can you still get pleasure if you quit? Here are some questions to ask your caregiver:  How can you help me to be successful at quitting?  What medicine do you think would be best for me and how should I take it?  What should I do if I need more help?  What is smoking withdrawal like? How can I get information on withdrawal? GET READY  Set a quit date.  Change your environment by getting rid of all cigarettes, ashtrays, matches, and lighters in your home, car, or work. Do not let people smoke in your home.  Review your past attempts to quit. Think about what worked and what did not. GET SUPPORT AND ENCOURAGEMENT You have a better chance of being successful if you have help. You can get support in many ways.  Tell your family, friends, and co-workers that you are going to quit and need their support. Ask them not to smoke around you.  Get individual, group, or telephone counseling and support. Programs are available at local hospitals and health centers. Call your local health department for information about programs in your area.  Spiritual beliefs and practices may help some smokers quit.  Download a "quit meter" on your computer to keep track of quit statistics, such as how long you have gone without smoking, cigarettes not smoked, and money saved.  Get a self-help book about quitting smoking and staying off of tobacco. LEARN NEW SKILLS AND BEHAVIORS  Distract yourself from urges to smoke. Talk to someone, go for a walk, or occupy your time with a task.  Change your normal routine. Take a different route to work. Drink tea instead of coffee. Eat breakfast in a different place.  Reduce your stress. Take a hot bath, exercise, or read a book.  Plan something enjoyable to do every day. Reward yourself for not smoking.  Explore  interactive web-based programs that specialize in helping you quit. GET MEDICINE AND USE IT CORRECTLY Medicines can help you stop smoking and decrease the urge to smoke. Combining medicine with the above behavioral methods and support can greatly increase your chances of successfully quitting smoking.  Nicotine replacement therapy helps deliver nicotine to your body without the negative effects and risks of smoking. Nicotine replacement therapy includes nicotine gum, lozenges, inhalers, nasal sprays, and skin patches. Some may be available over-the-counter and others require a prescription.  Antidepressant medicine helps people abstain from smoking, but how this works is unknown. This medicine is available by prescription.    Nicotinic receptor partial agonist medicine simulates the effect of nicotine in your brain. This medicine is available by prescription. Ask your caregiver for advice about which medicines to use and how to use them based on your health history. Your caregiver will tell you what side effects to look out for if you choose to be on a medicine or therapy. Carefully read the information on the package. Do not use any other product containing nicotine while using a nicotine replacement product.  RELAPSE OR DIFFICULT SITUATIONS Most relapses occur within the first 3 months after quitting. Do not be discouraged if you start smoking again. Remember, most people try several times before finally quitting. You may have symptoms of withdrawal because your body is used to nicotine. You may crave cigarettes, be irritable, feel very hungry, cough often, get headaches, or have difficulty concentrating. The withdrawal symptoms are only temporary. They are strongest when you first quit, but they will go away within 10 14 days. To reduce the chances of relapse, try to:  Avoid drinking alcohol. Drinking lowers your chances of successfully quitting.  Reduce the amount of caffeine you consume. Once you  quit smoking, the amount of caffeine in your body increases and can give you symptoms, such as a rapid heartbeat, sweating, and anxiety.  Avoid smokers because they can make you want to smoke.  Do not let weight gain distract you. Many smokers will gain weight when they quit, usually less than 10 pounds. Eat a healthy diet and stay active. You can always lose the weight gained after you quit.  Find ways to improve your mood other than smoking. FOR MORE INFORMATION  www.smokefree.gov  Document Released: 10/29/2001 Document Revised: 05/05/2012 Document Reviewed: 02/13/2012 [employer]Care Patient Information 2014 [employer].  

## 2014-04-25 NOTE — Progress Notes (Signed)
[facility]ISTORY [facility]YSICAL -PAD  History of Present Illness [name] is a 48 y.o. female patient of Dr. Trula Slade. The patient comes back today for followup. She is status post right femoral to below knee popliteal artery bypass graft with vein on 07/05/2010 for nonhealing ulcer, and left femoral to below knee popliteal artery bypass graft with vein on 06/27/2011. She has known back issues, had spine surgery x2, has pain in her legs at rest and with walking, difficult to ascertain from her explanation if she has claudication symptoms, denies non healing wounds. She is changing PCP's to [facility] in [place]. Constant diarrhea, does not use metformin, states she was told that she has IBS.  The patient denies New Medical or Surgical History.  Pt Diabetic: Yes, uncontrolled Pt smoker: smoker  (1/2 ppd, started smoking at age 43 or 89 yrs)  Pt meds include: Statin :No, Crestor caused itching and generalized redness Betablocker: No ASA: No, 325 mg ASA exacerbates her GERD; advised 81 mg enteric coated daily Other anticoagulants/antiplatelets: no  Past Medical History  Diagnosis Date  . Diabetes mellitus   . PAD (peripheral artery disease)   . Chronic back pain   . Asthma   . Hyperlipidemia   . Leg pain     With Walking  . Depression   . Arthritis   . Ulcer     Foot  . Reflux   . Joint pain   . Peripheral arterial disease     Social History History  Substance Use Topics  . Smoking status: Current Every Day Smoker -- 0.50 packs/day for 30 years    Types: Cigarettes  . Smokeless tobacco: Never Used  . Alcohol Use: No    Family History Family History  Problem Relation Age of Onset  . Coronary artery disease Mother   . Peripheral vascular disease Mother   . Heart disease Mother     Before age 75  . Other Mother     Venous insuffiency  . Diabetes Mother   . Hyperlipidemia Mother   . Hypertension Mother   . Varicose Veins  Mother   . Heart attack Mother   . Heart disease Father   . Diabetes Father   . Diabetes Sister   . Hypertension Sister   . Diabetes Brother   . Hypertension Brother   . Diabetes Maternal Grandmother   . Diabetes Paternal Grandmother   . Diabetes Paternal Grandfather     Past Surgical History  Procedure Laterality Date  . Arterial bypass surgry  07/05/2010    Right Common Femoral to below knee popliteal BPG  . Skin graft Right 2012    [name] by Dr. Nils Pyle- Right and Left Ankle  . Back surgery      X's  2  . Tonsillectomy    . Spine surgery    . Cholecystectomy      Gall Bladder    Allergies  Allergen Reactions  . Penicillins Other (See Comments)    Severe Headache    Current Outpatient Prescriptions  Medication Sig Dispense Refill  . albuterol (PROVENTIL) 2 MG tablet Take 2 mg by mouth 3 (three) times daily.      Marland Kitchen aspirin 325 MG tablet Take 325 mg by mouth daily as needed for pain.       . cephALEXin (KEFLEX) 500 MG capsule Take 1 capsule (500 mg total) by mouth 4 (four) times daily. For 10 days  40 capsule  0  .  escitalopram (LEXAPRO) 20 MG tablet Take 20 mg by mouth daily.      . furosemide (LASIX) 40 MG tablet Take 40 mg by mouth 2 (two) times daily.      Marland Kitchen glimepiride (AMARYL) 2 MG tablet Take 2 mg by mouth 2 (two) times daily.       Marland Kitchen griseofulvin (GRIS-PEG) 250 MG tablet Take 1 tablet by mouth daily.      Marland Kitchen HYDROcodone-acetaminophen (NORCO) 10-325 MG per tablet Take 2 tablets by mouth every 8 (eight) hours as needed for pain.      . INVOKANA 300 MG TABS Take 300 mg by mouth daily.      Marland Kitchen LANTUS 100 UNIT/ML injection       . LYRICA 100 MG capsule 100 mg 3 (three) times daily.       . methocarbamol (ROBAXIN) 500 MG tablet Take 500 mg by mouth 3 (three) times daily as needed (for muscle spasms).       . morphine (MS CONTIN) 15 MG 12 hr tablet Prn only      . omeprazole (PRILOSEC) 40 MG capsule Take 40 mg by mouth daily.       No current facility-administered  medications for this visit.    ROS: See HPI for pertinent positives and negatives.   Physical Examination  Filed Vitals:   04/25/14 1518  BP: 90/62  Pulse: 67  Resp: 14   Body mass index is 27.45 kg/(m^2).  General: A&O x 3, WDWN. Gait: normal Eyes: PERRLA. Pulmonary: CTAB, without wheezes , rales or rhonchi. Cardiac: regular Rythm , without detected murmur.         Carotid Bruits Left Right   Negative Negative  Aorta is not palpable. Radial pulses: 1+ palpable and =                           VASCULAR EXAM: Extremities without ischemic changes  without Gangrene; without open wounds.                                                                                                          LE Pulses LEFT RIGHT       AORTA Not palpable N/A       FEMORAL  2+ palpable  2+ palpable        POPLITEAL  not palpable   not palpable       POSTERIOR TIBIAL  not palpable   not palpable        DORSALIS PEDIS      ANTERIOR TIBIAL 2+ palpable  2+ palpable    Abdomen: soft, NT, no masses. Skin: no rashes, no ulcers noted. Musculoskeletal: no muscle wasting or atrophy.  Neurologic: A&O X 3; Appropriate Affect ; SENSATION: normal; MOTOR FUNCTION:  moving all extremities equally, motor strength 3/5 throughout. Speech is fluent/normal. CN 2-12 intact.    Non-Invasive Vascular Imaging: DATE: 04/25/2014 LOWER EXTREMITY ARTERIAL DUPLEX EVALUATION    INDICATION: Peripheral Vascular Disease     PREVIOUS INTERVENTION(S): Right common femoral to  popliteal BPG 07/05/2010; Left femoral to popliteal BPG 06/27/2011    DUPLEX EXAM:     RIGHT  LEFT   Peak Systolic Velocity (cm/s) Ratio (if abnormal) Waveform  Peak Systolic Velocity (cm/s) Ratio (if abnormal) Waveform  119/261 2.2 M/B Inflow Artery 84  T  352/50 7.0 B/M Proximal Anastomosis 107  B  50  M Proximal Graft 53  B  52  M Mid Graft 56  B  54  M  Distal Graft 57  B  53/147 2.8 M/M Distal Anastomosis 80/126  B/B  101/41  M/M  Outflow Artery 258/50 5.2 B/M  1.13/0.88 Today's ABI / TBI 1.30/0.92  1.15/0.73 Previous ABI / TBI (04/05/2013  ) 1.15/0.68    Waveform:    M - Monophasic       B - Biphasic       T - Triphasic  If Ankle Brachial Index (ABI) or Toe Brachial Index (TBI) performed, please see complete report     ADDITIONAL FINDINGS:     IMPRESSION: Elevated velocities suggestive of greater than 50% stenosis present involving the right common femoral artery, proximal graft anastomosis, and distal graft anastomosis. Elevated velocities and ratios present involving the left arterial outflow at the proximal posterior tibial artery suggestive of greater than 50%.    Compared to the previous exam:  Stable ankle brachial indices and toe brachial indices since study on 04/05/2013.     ASSESSMENT: [name] is a 48 y.o. female who is status post right femoral to below knee popliteal artery bypass graft with vein on 07/05/2010 for nonhealing ulcer, and left femoral to below knee popliteal artery bypass graft with vein on 06/27/2011. She has known back issues, had spine surgery x2, has pain in her legs at rest and with walking, difficult to ascertain from her explanation if she has claudication symptoms, denies non healing wounds. Her most prominent atherosclerotic risk factors are smoking and uncontrolled DM.  Her ABI's are normal in both legs but she has elevated velocities suggestive of greater than 50% stenosis present involving the right common femoral artery, proximal graft anastomosis, and distal graft anastomosis. Elevated velocities and ratios present involving the left arterial outflow at the proximal posterior tibial artery suggestive of greater than 50%.  PLAN:  Counseled re smoking cessation. I discussed in depth with the patient the nature of atherosclerosis, and emphasized the importance of maximal medical management including strict control of blood pressure, blood glucose, and lipid levels,  obtaining regular exercise, and cessation of smoking.  The patient is aware that without maximal medical management the underlying atherosclerotic disease process will progress, limiting the benefit of any interventions.  Based on the patient's vascular studies and examination, pt will be scheduled for arteriogram with Dr. Trula Slade, Tuesday, 05/03/14, with run off, for  stenosis proximal to right fem pop BPG (352 cm/sec velocity).  The patient was given information about PAD including signs, symptoms, treatment, what symptoms should prompt the patient to seek immediate medical care, and risk reduction measures to take.  Clemon Chambers, RN, MSN, FNP-C [facility] Phone: [phone]  Clinic MD: Trula Slade  04/25/2014 3:32 PM

## 2014-04-26 ENCOUNTER — Other Ambulatory Visit: Payer: Self-pay

## 2014-04-28 ENCOUNTER — Encounter (HOSPITAL_COMMUNITY): Payer: Self-pay | Admitting: Pharmacy Technician

## 2014-05-02 MED ORDER — SODIUM CHLORIDE 0.9 % IV SOLN: INTRAVENOUS | Status: DC

## 2014-05-03 ENCOUNTER — Ambulatory Visit (HOSPITAL_COMMUNITY): Admission: RE | Admit: 2014-05-03 | Payer: Medicare Other | Source: Ambulatory Visit | Admitting: Surgery

## 2014-05-03 ENCOUNTER — Telehealth: Payer: Self-pay

## 2014-05-03 SURGERY — ABDOMINAL AORTAGRAM
Anesthesia: LOCAL

## 2014-05-03 NOTE — Telephone Encounter (Signed)
Phone call from pt. rec'd via the Answering Service; message stated pt. wanted to cancel procedure for Abdominal Aortogram due to death in the family.  Procedure cancelled.  Spoke with pt. on phone; she will call back to reschedule at a later time.

## 2014-05-16 ENCOUNTER — Other Ambulatory Visit: Payer: Self-pay

## 2014-05-17 ENCOUNTER — Encounter (HOSPITAL_COMMUNITY): Payer: Self-pay | Admitting: Pharmacy Technician

## 2014-05-19 ENCOUNTER — Other Ambulatory Visit: Payer: Self-pay

## 2014-06-14 MED ORDER — SODIUM CHLORIDE 0.9 % IV SOLN
INTRAVENOUS | Status: DC
  Administered 2014-06-15: 07:00:00 via INTRAVENOUS

## 2014-06-15 ENCOUNTER — Encounter (HOSPITAL_COMMUNITY)
Admission: RE | Disposition: A | Discharge status: Home / Self Care | Payer: Self-pay | Source: Ambulatory Visit | Attending: Surgery

## 2014-06-15 ENCOUNTER — Ambulatory Visit (HOSPITAL_COMMUNITY)
Admission: RE | Admit: 2014-06-15 | Discharge: 2014-06-15 | Disposition: A | Discharge status: Home / Self Care | Payer: Medicare Other | Source: Ambulatory Visit | Attending: Surgery | Admitting: Surgery

## 2014-06-15 ENCOUNTER — Other Ambulatory Visit: Payer: Self-pay

## 2014-06-15 DIAGNOSIS — G8929 Other chronic pain: Secondary | ICD-10-CM | POA: Insufficient documentation

## 2014-06-15 DIAGNOSIS — IMO0001 Reserved for inherently not codable concepts without codable children: Secondary | ICD-10-CM | POA: Diagnosis not present

## 2014-06-15 DIAGNOSIS — F3289 Other specified depressive episodes: Secondary | ICD-10-CM | POA: Diagnosis not present

## 2014-06-15 DIAGNOSIS — F17200 Nicotine dependence, unspecified, uncomplicated: Secondary | ICD-10-CM | POA: Diagnosis not present

## 2014-06-15 DIAGNOSIS — M549 Dorsalgia, unspecified: Secondary | ICD-10-CM | POA: Diagnosis not present

## 2014-06-15 DIAGNOSIS — K219 Gastro-esophageal reflux disease without esophagitis: Secondary | ICD-10-CM | POA: Insufficient documentation

## 2014-06-15 DIAGNOSIS — I70409 Unspecified atherosclerosis of autologous vein bypass graft(s) of the extremities, unspecified extremity: Secondary | ICD-10-CM | POA: Insufficient documentation

## 2014-06-15 DIAGNOSIS — E785 Hyperlipidemia, unspecified: Secondary | ICD-10-CM | POA: Diagnosis not present

## 2014-06-15 DIAGNOSIS — Z48812 Encounter for surgical aftercare following surgery on the circulatory system: Secondary | ICD-10-CM

## 2014-06-15 DIAGNOSIS — F329 Major depressive disorder, single episode, unspecified: Secondary | ICD-10-CM | POA: Diagnosis not present

## 2014-06-15 DIAGNOSIS — I739 Peripheral vascular disease, unspecified: Secondary | ICD-10-CM

## 2014-06-15 DIAGNOSIS — J45909 Unspecified asthma, uncomplicated: Secondary | ICD-10-CM | POA: Insufficient documentation

## 2014-06-15 DIAGNOSIS — Z9862 Peripheral vascular angioplasty status: Secondary | ICD-10-CM

## 2014-06-15 DIAGNOSIS — T82898A Other specified complication of vascular prosthetic devices, implants and grafts, initial encounter: Secondary | ICD-10-CM

## 2014-06-15 DIAGNOSIS — Z7982 Long term (current) use of aspirin: Secondary | ICD-10-CM | POA: Diagnosis not present

## 2014-06-15 DIAGNOSIS — M129 Arthropathy, unspecified: Secondary | ICD-10-CM | POA: Diagnosis not present

## 2014-06-15 DIAGNOSIS — E1165 Type 2 diabetes mellitus with hyperglycemia: Secondary | ICD-10-CM

## 2014-06-15 HISTORY — PX: ABDOMINAL AORTAGRAM: SHX5706

## 2014-06-15 HISTORY — PX: ABDOMINAL AORTAGRAM: SHX5454

## 2014-06-15 LAB — POCT I-STAT, CHEM 8
BUN: 20 mg/dL (ref 6–23)
CALCIUM ION: 1.22 mmol/L (ref 1.12–1.23)
CHLORIDE: 98 meq/L (ref 96–112)
Creatinine, Ser: 0.7 mg/dL (ref 0.50–1.10)
Glucose, Bld: 256 mg/dL — ABNORMAL HIGH (ref 70–99)
HCT: 47 % — ABNORMAL HIGH (ref 36.0–46.0)
Hemoglobin: 16 g/dL — ABNORMAL HIGH (ref 12.0–15.0)
POTASSIUM: 3.5 meq/L — AB (ref 3.7–5.3)
SODIUM: 135 meq/L — AB (ref 137–147)
TCO2: 26 mmol/L (ref 0–100)

## 2014-06-15 LAB — POCT ACTIVATED CLOTTING TIME
ACTIVATED CLOTTING TIME: 248 s
ACTIVATED CLOTTING TIME: 208 s
Activated Clotting Time: 242 seconds
Activated Clotting Time: 152 seconds

## 2014-06-15 LAB — GLUCOSE, CAPILLARY
GLUCOSE-CAPILLARY: 243 mg/dL — AB (ref 70–99)
Glucose-Capillary: 231 mg/dL — ABNORMAL HIGH (ref 70–99)

## 2014-06-15 SURGERY — ABDOMINAL AORTAGRAM
Anesthesia: LOCAL

## 2014-06-15 MED ORDER — ACETAMINOPHEN 325 MG RE SUPP: 325.0000 mg | RECTAL | Status: DC | PRN

## 2014-06-15 MED ORDER — HEPARIN (PORCINE) IN NACL 2-0.9 UNIT/ML-% IJ SOLN
INTRAMUSCULAR | Status: AC
  Filled 2014-06-15: qty 1000

## 2014-06-15 MED ORDER — MIDAZOLAM HCL 2 MG/2ML IJ SOLN
INTRAMUSCULAR | Status: AC
  Filled 2014-06-15: qty 2

## 2014-06-15 MED ORDER — METOPROLOL TARTRATE 1 MG/ML IV SOLN: 2.0000 mg | INTRAVENOUS | Status: DC | PRN

## 2014-06-15 MED ORDER — ACETAMINOPHEN 325 MG PO TABS
ORAL_TABLET | ORAL | Status: AC
  Administered 2014-06-15: 650 mg via ORAL
  Filled 2014-06-15: qty 2

## 2014-06-15 MED ORDER — SODIUM CHLORIDE 0.9 % IV BOLUS (SEPSIS)
500.0000 mL | Freq: Once | INTRAVENOUS | Status: AC
  Administered 2014-06-15: 500 mL via INTRAVENOUS

## 2014-06-15 MED ORDER — MIDAZOLAM HCL 2 MG/2ML IJ SOLN
1.0000 mg | Freq: Once | INTRAMUSCULAR | Status: AC
  Administered 2014-06-15: 1 mg via INTRAVENOUS

## 2014-06-15 MED ORDER — LABETALOL HCL 5 MG/ML IV SOLN: 10.0000 mg | INTRAVENOUS | Status: DC | PRN

## 2014-06-15 MED ORDER — ACETAMINOPHEN 325 MG PO TABS: 325.0000 mg | ORAL_TABLET | ORAL | Status: DC | PRN

## 2014-06-15 MED ORDER — PHENOL 1.4 % MT LIQD: 1.0000 | OROMUCOSAL | Status: DC | PRN

## 2014-06-15 MED ORDER — GUAIFENESIN-DM 100-10 MG/5ML PO SYRP: 15.0000 mL | ORAL_SOLUTION | ORAL | Status: DC | PRN

## 2014-06-15 MED ORDER — HYDRALAZINE HCL 20 MG/ML IJ SOLN: 10.0000 mg | INTRAMUSCULAR | Status: DC | PRN

## 2014-06-15 MED ORDER — SODIUM CHLORIDE 0.9 % IV SOLN: 1.0000 mL/kg/h | INTRAVENOUS | Status: DC

## 2014-06-15 MED ORDER — LIDOCAINE HCL (PF) 1 % IJ SOLN
INTRAMUSCULAR | Status: AC
  Filled 2014-06-15: qty 30

## 2014-06-15 MED ORDER — FENTANYL CITRATE 0.05 MG/ML IJ SOLN
INTRAMUSCULAR | Status: AC
  Filled 2014-06-15: qty 2

## 2014-06-15 MED ORDER — HEPARIN SODIUM (PORCINE) 1000 UNIT/ML IJ SOLN
INTRAMUSCULAR | Status: AC
  Filled 2014-06-15: qty 1

## 2014-06-15 MED ORDER — ALUM & MAG HYDROXIDE-SIMETH 200-200-20 MG/5ML PO SUSP: 15.0000 mL | ORAL | Status: DC | PRN

## 2014-06-15 MED ORDER — ONDANSETRON HCL 4 MG/2ML IJ SOLN: 4.0000 mg | Freq: Four times a day (QID) | INTRAMUSCULAR | Status: DC | PRN

## 2014-06-15 MED ORDER — MIDAZOLAM HCL 2 MG/2ML IJ SOLN
INTRAMUSCULAR | Status: AC
  Administered 2014-06-15: 08:00:00 1 mg via INTRAVENOUS
  Filled 2014-06-15: qty 2

## 2014-06-15 SURGICAL SUPPLY — 55 items
ADH SKN CLS APL DERMABOND .7 (GAUZE/BANDAGES/DRESSINGS) ×1
BANDAGE ELASTIC 4 VELCRO ST LF (GAUZE/BANDAGES/DRESSINGS) IMPLANT
BANDAGE ESMARK 6X9 LF (GAUZE/BANDAGES/DRESSINGS) IMPLANT
BNDG CMPR 9X6 STRL LF SNTH (GAUZE/BANDAGES/DRESSINGS)
BNDG ESMARK 6X9 LF (GAUZE/BANDAGES/DRESSINGS)
CANISTER SUCTION 2500CC (MISCELLANEOUS) ×2 IMPLANT
CLIP TI MEDIUM 24 (CLIP) ×2 IMPLANT
CLIP TI WIDE RED SMALL 24 (CLIP) ×2 IMPLANT
COVER SURGICAL LIGHT HANDLE (MISCELLANEOUS) ×2 IMPLANT
CUFF TOURNIQUET SINGLE 24IN (TOURNIQUET CUFF) IMPLANT
CUFF TOURNIQUET SINGLE 34IN LL (TOURNIQUET CUFF) IMPLANT
CUFF TOURNIQUET SINGLE 44IN (TOURNIQUET CUFF) IMPLANT
DERMABOND ADVANCED (GAUZE/BANDAGES/DRESSINGS) ×1
DERMABOND ADVANCED .7 DNX12 (GAUZE/BANDAGES/DRESSINGS) ×1 IMPLANT
DRAIN CHANNEL 15F RND FF W/TCR (WOUND CARE) IMPLANT
DRAPE WARM FLUID 44X44 (DRAPE) ×2 IMPLANT
DRAPE X-RAY CASS 24X20 (DRAPES) IMPLANT
DRSG COVADERM 4X10 (GAUZE/BANDAGES/DRESSINGS) IMPLANT
DRSG COVADERM 4X8 (GAUZE/BANDAGES/DRESSINGS) IMPLANT
ELECT REM PT RETURN 9FT ADLT (ELECTROSURGICAL) ×2
ELECTRODE REM PT RTRN 9FT ADLT (ELECTROSURGICAL) ×1 IMPLANT
EVACUATOR SILICONE 100CC (DRAIN) IMPLANT
GLOVE BIOGEL PI IND STRL 7.5 (GLOVE) ×1 IMPLANT
GLOVE BIOGEL PI INDICATOR 7.5 (GLOVE) ×1
GLOVE SURG SS PI 7.5 STRL IVOR (GLOVE) ×2 IMPLANT
GOWN PREVENTION PLUS XXLARGE (GOWN DISPOSABLE) ×2 IMPLANT
GOWN STRL NON-REIN LRG LVL3 (GOWN DISPOSABLE) ×6 IMPLANT
HEMOSTAT SNOW SURGICEL 2X4 (HEMOSTASIS) IMPLANT
KIT BASIN OR (CUSTOM PROCEDURE TRAY) ×2 IMPLANT
KIT ROOM TURNOVER OR (KITS) ×2 IMPLANT
MARKER GRAFT CORONARY BYPASS (MISCELLANEOUS) IMPLANT
NS IRRIG 1000ML POUR BTL (IV SOLUTION) ×4 IMPLANT
PACK PERIPHERAL VASCULAR (CUSTOM PROCEDURE TRAY) ×2 IMPLANT
PAD ARMBOARD 7.5X6 YLW CONV (MISCELLANEOUS) ×4 IMPLANT
PADDING CAST COTTON 6X4 STRL (CAST SUPPLIES) IMPLANT
SET COLLECT BLD 21X3/4 12 (NEEDLE) IMPLANT
STOPCOCK 4 WAY LG BORE MALE ST (IV SETS) IMPLANT
SUT ETHILON 3 0 PS 1 (SUTURE) IMPLANT
SUT PROLENE 5 0 C 1 24 (SUTURE) ×2 IMPLANT
SUT PROLENE 6 0 BV (SUTURE) ×2 IMPLANT
SUT PROLENE 7 0 BV 1 (SUTURE) IMPLANT
SUT SILK 2 0 SH (SUTURE) ×2 IMPLANT
SUT SILK 3 0 (SUTURE)
SUT SILK 3-0 18XBRD TIE 12 (SUTURE) IMPLANT
SUT VIC AB 2-0 CT1 27 (SUTURE) ×4
SUT VIC AB 2-0 CT1 TAPERPNT 27 (SUTURE) ×2 IMPLANT
SUT VIC AB 3-0 SH 27 (SUTURE) ×4
SUT VIC AB 3-0 SH 27X BRD (SUTURE) ×2 IMPLANT
SUT VICRYL 4-0 PS2 18IN ABS (SUTURE) ×4 IMPLANT
TOWEL OR 17X24 6PK STRL BLUE (TOWEL DISPOSABLE) ×4 IMPLANT
TOWEL OR 17X26 10 PK STRL BLUE (TOWEL DISPOSABLE) ×4 IMPLANT
TRAY FOLEY CATH 16FRSI W/METER (SET/KITS/TRAYS/PACK) ×2 IMPLANT
TUBING EXTENTION W/L.L. (IV SETS) IMPLANT
UNDERPAD 30X30 INCONTINENT (UNDERPADS AND DIAPERS) ×2 IMPLANT
WATER STERILE IRR 1000ML POUR (IV SOLUTION) ×2 IMPLANT

## 2014-06-15 NOTE — Progress Notes (Signed)
PER DR LAWSON IF B/P GREATER THAN 80SYSTOLIC AFTER IVF OK TO D/C HOME IF CLIENT ASYMPTOMATIC; CLIENT STATES READY TO GO HOME NO COMPLAINTS; UP AND WALKED AND TOL WELL

## 2014-06-15 NOTE — Progress Notes (Signed)
Report given to Melanie RN.

## 2014-06-15 NOTE — Op Note (Signed)
Patient name: [name] MRN: [id number] DOB: [birth year] Sex: female  06/15/2014 Pre-operative Diagnosis: Bypass graft stenosis Post-operative diagnosis:  Same Surgeon:  Eldridge Abrahams Procedure Performed:  1.  ultrasound-guided access, left femoral artery  2.  abdominal aortogram  3.  bilateral lower extremity runoff  4.  third order catheterization  5.  drug coated balloon angioplasty, right femoral-popliteal bypass graft  6.  balloon angioplasty right common femoral and external iliac artery   Indications:  The patient is status post bilateral femoral below-knee popliteal artery bypass graft for ulcer disease.  She was found to have a high-grade velocity at the proximal bypass graft on the right she is here for further evaluation and treatment  Procedure:  The patient was identified in the holding area and taken to room 8.  The patient was then placed supine on the table and prepped and draped in the usual sterile fashion.  A time out was called.  Ultrasound was used to evaluate the left common femoral artery.  It was patent .  A digital ultrasound image was acquired.  A micropuncture needle was used to access the left common femoral artery under ultrasound guidance.  An 018 wire was advanced without resistance and a micropuncture sheath was placed.  The 018 wire was removed and a benson wire was placed.  The micropuncture sheath was exchanged for a 5 french sheath.  An omniflush catheter was advanced over the wire to the level of L-1.  An abdominal angiogram was obtained.  Next, using the omniflush catheter and a benson wire, the aortic bifurcation was crossed and the catheter was placed into theright external iliac artery and right runoff was obtained.  left runoff was performed via retrograde sheath injections.  Findings:   Aortogram:  No significant renal artery stenosis was identified.  The infrarenal abdominal aorta is widely patent.  Bilateral common iliac arteries are  widely patent.  Diffuse disease is noted throughout bilateral external iliac arteries without hemodynamically significant stenosis.  Right Lower Extremity:  The right common femoral artery is widely patent.  The profunda femoral artery is patent distally but had several areas of stenosis distally.  A femoral to below knee popliteal artery bypass graft is visualized.  Bypass graft is widely patent.  There is approximately 70% stenosis of a 4 cm segment at the proximal anastomosis.  The distal anastomosis is widely patent.  There is three-vessel runoff.  Left Lower Extremity:  Left common femoral profunda femoral artery are patent.  A femoral to below knee popliteal artery bypass graft is widely patent without significant anastomotic stenosis.  Three-vessel runoff is identified.  Intervention:  After the above images were acquired, the decision was made to proceed with intervention.  I had trouble getting a 6 French sheath over the aortic bifurcation.  This required the use of a Berenstein 2 catheter and a Amplatz superstiff wire.  I was ultimately able to get a 6 French 45 cm sheath into the right external iliac artery.  The patient was fully heparinized.  A 014 [place] core wire was advanced into the bypass graft with minimal difficulty.  I elected to pretreat the stenosis in the proximal bypass graft with a 5 x 40 IUD balloon.  This was taken to nominal pressure for 1 minute.  I noticed that contrast was held up and the external iliac artery.  This raises the concern of a dissection.  Next, I took a 6 x 60 Lutonix drug coated balloon  and perform balloon angioplasty of the proximal bypass graft.  The balloon was taken to nominal pressure for 2 minutes.  I then used this balloon on the way out to perform angioplasty of the common femoral and external iliac artery where I thought there was a dissection.  The balloon was taken to profile for 2 minutes.  Completion angiography was then performed.  The defect in the  external iliac artery persisted and I felt there was a small dissection and the proximal bypass graft.  I reinserted the 6 x 60 balloon and treated both of these areas.  I then withdrew the sheath into the common iliac artery and this appeared to resolve the issue in the distal external iliac artery.  Also, completion imaging the dissection in the proximal bypass graft had resolved.  I imaged the foot after the intervention and there was similar blood flow to 3 intervention.  At this point the sheath was withdrawn to the left external iliac artery or ill remain until the coagulation profile corrects.  There were no immediate complications.  Impression:  #1  successful angioplasty of the right femoral popliteal bypass graft at the proximal anastomosis using a 6 x 60 drug coated balloon.  #2  balloon angioplasty of a right external iliac artery dissection vs. filling defect from sheath occlusion.  #3  no significant bypass graft stenosis on the left    V. [name], M.D. [facility] Office: [phone] Pager:  [phone]

## 2014-06-15 NOTE — Discharge Instructions (Signed)
Arteriogram °Care After °These instructions give you information on caring for yourself after your procedure. Your doctor may also give you more specific instructions. Call your doctor if you have any problems or questions after your procedure. °HOME CARE °· Keep your leg straight for at least 6 hours. °· Do not bathe, swim, or use a hot tub until directed by your doctor. You can shower. °· Do not lift anything heavier than 10 pounds (about a gallon of milk) for 2 days. °· Do not walk a lot, run, or drive for 2 days. °· Return to normal activities in 2 days or as told by your doctor. °Finding out the results of your test °Ask when your test results will be ready. Make sure you get your test results. °GET HELP RIGHT AWAY IF:  °· You have fever. °· You have more pain in your leg. °· The leg that was cut is: °¨ Bleeding. °¨ Puffy (swollen) or red. °¨ Cold. °¨ Pale or changes color. °¨ Weak. °¨ Tingly or numb. °If you go to the Emergency Room, tell your nurse that you have had an arteriogram. Take this paper with you to show the nurse. °MAKE SURE YOU: °· Understand these instructions. °· Will watch your condition. °· Will get help right away if you are not doing well or get worse. °Document Released: 01/31/2009 Document Revised: 11/09/2013 Document Reviewed: 01/31/2009 °Exit[employer]® Patient Information ©2015 [employer]. This information is not intended to replace advice given to you by your health care provider. Make sure you discuss any questions you have with your health care provider. ° °

## 2014-06-15 NOTE — Progress Notes (Signed)
Site area: left groin  Site Prior to Removal:  Level 0 Pressure Applied For: 20 minutes Manual:   Yes, pt has neuropathy and moved a  Lot  Patient Status During Pull:  No complications  Post Pull Site:  Level 0 Post Pull Instructions Given:  Yes  Post Pull Pulses Present: dp bilateral/ palpable  Dressing Applied:  tegaderm  Bedrest begins @ 1220 Comments: will give report to short stay nurse.

## 2014-06-15 NOTE — H&P (Signed)
[facility]  HISTORY AND PHYSICAL -PAD  History of Present Illness  [name] is a 48 y.o. female patient of Dr. Trula Slade.  The patient comes back today for followup. She is status post right femoral to below knee popliteal artery bypass graft with vein on 07/05/2010 for nonhealing ulcer, and left femoral to below knee popliteal artery bypass graft with vein on 06/27/2011.  She has known back issues, had spine surgery x2, has pain in her legs at rest and with walking, difficult to ascertain from her explanation if she has claudication symptoms, denies non healing wounds.  She is changing PCP's to [facility] in [place].  Constant diarrhea, does not use metformin, states she was told that she has IBS.  The patient denies New Medical or Surgical History.  Pt Diabetic: Yes, uncontrolled  Pt smoker: smoker (1/2 ppd, started smoking at age 64 or 89 yrs)  Pt meds include:  Statin :No, Crestor caused itching and generalized redness  Betablocker: No  ASA: No, 325 mg ASA exacerbates her GERD; advised 81 mg enteric coated daily  Other anticoagulants/antiplatelets: no  Past Medical History   Diagnosis  Date   .  Diabetes mellitus    .  PAD (peripheral artery disease)    .  Chronic back pain    .  Asthma    .  Hyperlipidemia    .  Leg pain      With Walking   .  Depression    .  Arthritis    .  Ulcer      Foot   .  Reflux    .  Joint pain    .  Peripheral arterial disease    Social History  History   Substance Use Topics   .  Smoking status:  Current Every Day Smoker -- 0.50 packs/day for 30 years     Types:  Cigarettes   .  Smokeless tobacco:  Never Used   .  Alcohol Use:  No   Family History  Family History   Problem  Relation  Age of Onset   .  Coronary artery disease  Mother    .  Peripheral vascular disease  Mother    .  Heart disease  Mother      Before age 76   .  Other  Mother      Venous insuffiency   .  Diabetes  Mother    .   Hyperlipidemia  Mother    .  Hypertension  Mother    .  Varicose Veins  Mother    .  Heart attack  Mother    .  Heart disease  Father    .  Diabetes  Father    .  Diabetes  Sister    .  Hypertension  Sister    .  Diabetes  Brother    .  Hypertension  Brother    .  Diabetes  Maternal Grandmother    .  Diabetes  Paternal Grandmother    .  Diabetes  Paternal Grandfather     Past Surgical History   Procedure  Laterality  Date   .  Arterial bypass surgry   07/05/2010     Right Common Femoral to below knee popliteal BPG   .  Skin graft  Right  2012     [name] by Dr. Nils Pyle- Right and Left Ankle   .  Back surgery  X's 2   .  Tonsillectomy     .  Spine surgery     .  Cholecystectomy       Gall Bladder    Allergies   Allergen  Reactions   .  Penicillins  Other (See Comments)     Severe Headache    Current Outpatient Prescriptions   Medication  Sig  Dispense  Refill   .  albuterol (PROVENTIL) 2 MG tablet  Take 2 mg by mouth 3 (three) times daily.     Marland Kitchen  aspirin 325 MG tablet  Take 325 mg by mouth daily as needed for pain.     .  cephALEXin (KEFLEX) 500 MG capsule  Take 1 capsule (500 mg total) by mouth 4 (four) times daily. For 10 days  40 capsule  0   .  escitalopram (LEXAPRO) 20 MG tablet  Take 20 mg by mouth daily.     .  furosemide (LASIX) 40 MG tablet  Take 40 mg by mouth 2 (two) times daily.     Marland Kitchen  glimepiride (AMARYL) 2 MG tablet  Take 2 mg by mouth 2 (two) times daily.     Marland Kitchen  griseofulvin (GRIS-PEG) 250 MG tablet  Take 1 tablet by mouth daily.     Marland Kitchen  HYDROcodone-acetaminophen (NORCO) 10-325 MG per tablet  Take 2 tablets by mouth every 8 (eight) hours as needed for pain.     .  INVOKANA 300 MG TABS  Take 300 mg by mouth daily.     Marland Kitchen  LANTUS 100 UNIT/ML injection      .  LYRICA 100 MG capsule  100 mg 3 (three) times daily.     .  methocarbamol (ROBAXIN) 500 MG tablet  Take 500 mg by mouth 3 (three) times daily as needed (for muscle spasms).     .  morphine (MS CONTIN) 15  MG 12 hr tablet  Prn only     .  omeprazole (PRILOSEC) 40 MG capsule  Take 40 mg by mouth daily.      No current facility-administered medications for this visit.   ROS: See HPI for pertinent positives and negatives.  Physical Examination  Filed Vitals:    04/25/14 1518   BP:  90/62   Pulse:  67   Resp:  14   Body mass index is 27.45 kg/(m^2).  General: A&O x 3, WDWN.  Gait: normal  Eyes: PERRLA.  Pulmonary: CTAB, without wheezes , rales or rhonchi.  Cardiac: regular Rythm , without detected murmur.  Carotid Bruits  Left  Right    Negative  Negative   Aorta is not palpable.  Radial pulses: 1+ palpable and =  VASCULAR EXAM:  Extremities without ischemic changes  without Gangrene; without open wounds.  LE Pulses  LEFT  RIGHT   AORTA  Not palpable  N/A   FEMORAL  2+ palpable  2+ palpable   POPLITEAL  not palpable  not palpable   POSTERIOR TIBIAL  not palpable  not palpable   DORSALIS PEDIS  ANTERIOR TIBIAL  2+ palpable  2+ palpable   Abdomen: soft, NT, no masses.  Skin: no rashes, no ulcers noted.  Musculoskeletal: no muscle wasting or atrophy.  Neurologic: A&O X 3; Appropriate Affect ; SENSATION: normal; MOTOR FUNCTION: moving all extremities equally, motor strength 3/5 throughout. Speech is fluent/normal. CN 2-12 intact.  Non-Invasive Vascular Imaging: DATE: 04/25/2014  LOWER EXTREMITY ARTERIAL DUPLEX EVALUATION     INDICATION:  Peripheral Vascular Disease  PREVIOUS INTERVENTION(S):  Right common femoral to popliteal BPG 07/05/2010; Left femoral to popliteal BPG 06/27/2011     DUPLEX EXAM:      RIGHT   LEFT   Peak Systolic Velocity (cm/s)  Ratio (if abnormal)  Waveform   Peak Systolic Velocity (cm/s)  Ratio (if abnormal)  Waveform   119/261  2.2  M/B  Inflow Artery  84   T   352/50  7.0  B/M  Proximal Anastomosis  107   B   50   M  Proximal Graft  53   B   52   M  Mid Graft  56   B   54   M  Distal Graft  57   B   53/147  2.8  M/M  Distal Anastomosis  80/126   B/B    101/41   M/M  Outflow Artery  258/50  5.2  B/M   1.13/0.88  Today's ABI / TBI  1.30/0.92   1.15/0.73  Previous ABI / TBI (04/05/2013 )  1.15/0.68      Waveform: M - Monophasic B - Biphasic T - Triphasic  If Ankle Brachial Index (ABI) or Toe Brachial Index (TBI) performed, please see complete report     ADDITIONAL FINDINGS:      IMPRESSION:  Elevated velocities suggestive of greater than 50% stenosis present involving the right common femoral artery, proximal graft anastomosis, and distal graft anastomosis.  Elevated velocities and ratios present involving the left arterial outflow at the proximal posterior tibial artery suggestive of greater than 50%.     Compared to the previous exam:  Stable ankle brachial indices and toe brachial indices since study on 04/05/2013.    ASSESSMENT:  [name] is a 48 y.o. female who is status post right femoral to below knee popliteal artery bypass graft with vein on 07/05/2010 for nonhealing ulcer, and left femoral to below knee popliteal artery bypass graft with vein on 06/27/2011.  She has known back issues, had spine surgery x2, has pain in her legs at rest and with walking, difficult to ascertain from her explanation if she has claudication symptoms, denies non healing wounds.  Her most prominent atherosclerotic risk factors are smoking and uncontrolled DM.  Her ABI's are normal in both legs but she has elevated velocities suggestive of greater than 50% stenosis present involving the right common femoral artery, proximal graft anastomosis, and distal graft anastomosis.  Elevated velocities and ratios present involving the left arterial outflow at the proximal posterior tibial artery suggestive of greater than 50%.   PLAN:  Counseled re smoking cessation.  I discussed in depth with the patient the nature of atherosclerosis, and emphasized the importance of maximal medical management including strict control of blood pressure, blood glucose, and  lipid levels, obtaining regular exercise, and cessation of smoking. The patient is aware that without maximal medical management the underlying atherosclerotic disease process will progress, limiting the benefit of any interventions.  Based on the patient's vascular studies and examination, pt will be scheduled for arteriogram with Dr. Trula Slade, Tuesday, 05/03/14, with run off, for stenosis proximal to right fem pop BPG (352 cm/sec velocity).  The patient was given information about PAD including signs, symptoms, treatment, what symptoms should prompt the patient to seek immediate medical care, and risk reduction measures to take.    Clemon Chambers, RN, MSN, FNP-C  [facility] Phone: [phone]  Clinic MD: Trula Slade  04/25/2014 3:32 PM

## 2014-06-21 ENCOUNTER — Other Ambulatory Visit: Payer: Self-pay | Admitting: *Deleted

## 2014-08-25 ENCOUNTER — Ambulatory Visit: Payer: Self-pay | Admitting: Pain Medicine

## 2014-09-07 ENCOUNTER — Ambulatory Visit: Payer: Self-pay | Admitting: Pain Medicine

## 2014-09-18 HISTORY — PX: INTERCOSTAL NERVE BLOCK: SHX5021

## 2014-09-21 ENCOUNTER — Ambulatory Visit: Payer: Self-pay | Admitting: Pain Medicine

## 2014-09-22 ENCOUNTER — Ambulatory Visit: Payer: Medicare Other | Admitting: Family

## 2014-09-22 ENCOUNTER — Other Ambulatory Visit (HOSPITAL_COMMUNITY): Payer: Medicare Other

## 2014-09-22 ENCOUNTER — Encounter (HOSPITAL_COMMUNITY): Payer: Medicare Other

## 2014-10-05 ENCOUNTER — Other Ambulatory Visit: Payer: Self-pay | Admitting: *Deleted

## 2014-10-05 DIAGNOSIS — I739 Peripheral vascular disease, unspecified: Secondary | ICD-10-CM

## 2014-10-05 DIAGNOSIS — Z48812 Encounter for surgical aftercare following surgery on the circulatory system: Secondary | ICD-10-CM

## 2014-10-06 ENCOUNTER — Encounter: Payer: Self-pay | Admitting: Family

## 2014-10-07 ENCOUNTER — Ambulatory Visit (HOSPITAL_COMMUNITY)
Admission: RE | Admit: 2014-10-07 | Discharge: 2014-10-07 | Disposition: A | Discharge status: Home / Self Care | Payer: Medicare Other | Source: Ambulatory Visit | Attending: Family | Admitting: Family

## 2014-10-07 ENCOUNTER — Ambulatory Visit (INDEPENDENT_AMBULATORY_CARE_PROVIDER_SITE_OTHER): Payer: Medicare Other | Admitting: Family

## 2014-10-07 ENCOUNTER — Encounter: Payer: Self-pay | Admitting: Family

## 2014-10-07 ENCOUNTER — Ambulatory Visit (INDEPENDENT_AMBULATORY_CARE_PROVIDER_SITE_OTHER)
Admission: RE | Admit: 2014-10-07 | Discharge: 2014-10-07 | Disposition: A | Discharge status: Home / Self Care | Payer: Medicare Other | Source: Ambulatory Visit | Attending: Family | Admitting: Family

## 2014-10-07 VITALS — BP 106/60 | HR 54 | Resp 14 | Ht 66.0 in | Wt 171.8 lb

## 2014-10-07 DIAGNOSIS — I739 Peripheral vascular disease, unspecified: Secondary | ICD-10-CM

## 2014-10-07 DIAGNOSIS — Z48812 Encounter for surgical aftercare following surgery on the circulatory system: Secondary | ICD-10-CM

## 2014-10-07 DIAGNOSIS — M25559 Pain in unspecified hip: Secondary | ICD-10-CM | POA: Insufficient documentation

## 2014-10-07 DIAGNOSIS — R0989 Other specified symptoms and signs involving the circulatory and respiratory systems: Secondary | ICD-10-CM

## 2014-10-07 NOTE — Progress Notes (Signed)
[facility] HISTORY AND PHYSICAL -PAD  History of Present Illness [name] is a 48 y.o. female patient of Dr. Trula Slade. The patient comes back today for followup. She is status post right femoral to below knee popliteal artery bypass graft with vein on 07/05/2010 for nonhealing ulcer, and left femoral to below knee popliteal artery bypass graft with vein on 06/27/2011. She has known back issues, had spine surgery x2, has pain in her legs at rest and with walking, difficult to ascertain from her explanation if she has claudication symptoms, denies non healing wounds. Pt states she has never had a stroke or TIA. Her PCP is Dr. Delight Stare, [place]. Constant diarrhea, does not use metformin, states she was told that she has IBS.  The patient reports New Medical or Surgical History: is being seen in [facility], Dr. Primus Bravo. Pain clinic, received injections in her back recently which helped her midback but not her lower back and has not improved her tense neck pain. She states she has sciatic issues, pain shooting down both hips.  Pt Diabetic: Yes,pt states has improved to high 100's glucose, states she is working closely with her PCP Pt smoker: smoker (1/2 ppd, started smoking at age 70 or 57 yrs)  Pt meds include: Statin :No, Crestor caused itching and generalized redness Betablocker: No ASA: no, advised 81 mg enteric coated daily Other anticoagulants/antiplatelets: no     Past Medical History  Diagnosis Date  . Diabetes mellitus   . PAD (peripheral artery disease)   . Chronic back pain   . Asthma   . Hyperlipidemia   . Leg pain     With Walking  . Depression   . Arthritis   . Ulcer     Foot  . Reflux   . Joint pain   . Peripheral arterial disease     Social History History  Substance Use Topics  . Smoking status: Current Every Day Smoker -- 0.50 packs/day for 30 years    Types: Cigarettes  . Smokeless tobacco: Never Used  .  Alcohol Use: No    Family History Family History  Problem Relation Age of Onset  . Coronary artery disease Mother   . Peripheral vascular disease Mother   . Heart disease Mother     Before age 58  . Other Mother     Venous insuffiency  . Diabetes Mother   . Hyperlipidemia Mother   . Hypertension Mother   . Varicose Veins Mother   . Heart attack Mother   . Heart disease Father   . Diabetes Father   . Diabetes Sister   . Hypertension Sister   . Diabetes Brother   . Hypertension Brother   . Diabetes Maternal Grandmother   . Diabetes Paternal Grandmother   . Diabetes Paternal Grandfather     Past Surgical History  Procedure Laterality Date  . Arterial bypass surgry  07/05/2010    Right Common Femoral to below knee popliteal BPG  . Skin graft Right 2012    [name] by Dr. Nils Pyle- Right and Left Ankle  . Back surgery      X's  2  . Tonsillectomy    . Spine surgery    . Cholecystectomy      Gall Bladder    Allergies  Allergen Reactions  . Penicillins Other (See Comments)    Severe Headache    Current Outpatient Prescriptions  Medication Sig Dispense Refill  . acetaminophen (TYLENOL) 500 MG tablet  Take 500 mg by mouth every 8 (eight) hours as needed for moderate pain.    Marland Kitchen albuterol (PROVENTIL) 2 MG tablet Take 2 mg by mouth 2 (two) times daily.     Marland Kitchen aspirin 325 MG tablet Take 325 mg by mouth daily as needed for pain.     . furosemide (LASIX) 40 MG tablet Take 40 mg by mouth daily as needed for fluid.     Marland Kitchen ibuprofen (ADVIL,MOTRIN) 200 MG tablet Take 200 mg by mouth every 8 (eight) hours as needed for moderate pain.    Marland Kitchen LANTUS 100 UNIT/ML injection Inject 33 Units into the skin at bedtime.     Marland Kitchen LYRICA 100 MG capsule Take 100 mg by mouth 3 (three) times daily.     . methocarbamol (ROBAXIN) 500 MG tablet Take 500 mg by mouth 3 (three) times daily as needed (for muscle spasms).     . morphine (MS CONTIN) 15 MG 12 hr tablet Take 15 mg by mouth 2 (two) times daily as needed  (severe pain). Prn only    . naproxen sodium (ANAPROX) 220 MG tablet Take 220 mg by mouth 2 (two) times daily as needed (pain).     No current facility-administered medications for this visit.   Facility-Administered Medications Ordered in Other Visits  Medication Dose Route Frequency Provider Last Rate Last Dose  . 0.9 %  sodium chloride infusion   Intravenous Continuous Serafina Mitchell, MD        ROS: See HPI for pertinent positives and negatives.   Physical Examination  Filed Vitals:   10/07/14 1214  BP: 106/60  Pulse: 54  Resp: 14  Height: 5\' 6"  (1.676 m)  Weight: 171 lb 12.8 oz (77.928 kg)  SpO2: 97%   Body mass index is 27.74 kg/(m^2).  General: A&O x 3, WDWN. Gait: normal Eyes: PERRLA. Pulmonary: CTAB, without wheezes , rales or rhonchi. Cardiac: regular Rythm , without detected murmur.     Carotid Bruits Left Right   Negative Negative  Aorta is not palpable. Radial pulses: are not palpable, brachial pulses are not palpable. Capillary refill in fingers of both hands is brisk, fingers are pink   VASCULAR EXAM: Extremities without ischemic changes  without Gangrene; without open wounds.     LE Pulses LEFT RIGHT   AORTA Not palpable N/A   FEMORAL 2+ palpable 2+ palpable    POPLITEAL not palpable  not palpable   POSTERIOR TIBIAL not palpable  not palpable    DORSALIS PEDIS  ANTERIOR TIBIAL 2+ palpable  2+ palpable    Abdomen: soft, NT, no masses palpated. Skin: no rashes, no ulcers noted. Musculoskeletal: no muscle wasting or atrophy. Neurologic: A&O X 3; Appropriate Affect ; SENSATION: normal; MOTOR FUNCTION: moving all extremities equally, motor strength 3/5 throughout. Speech is fluent/normal.  CN 2-12  intact.    Non-Invasive Vascular Imaging: DATE: 10/07/2014 LOWER EXTREMITY ARTERIAL DUPLEX EVALUATION    INDICATION: Follow-up bilateral lower extremity bypass graft     PREVIOUS INTERVENTION(S): Right femoropopliteal arterial bypass graft placed 07/05/2010 with angioplasty of external iliac artery, common femoral artery, and proximal graft 06/15/2014 Left femoropopliteal arterial bypass graft placed 06/27/2011    DUPLEX EXAM:     RIGHT  LEFT   Peak Systolic Velocity (cm/s) Ratio (if abnormal) Waveform  Peak Systolic Velocity (cm/s) Ratio (if abnormal) Waveform  129  M Inflow Artery 268  T  359 2.8 Stenotic Proximal Anastomosis 158  B  50  M Proximal Graft 60  B  38  M Mid Graft 53  B  43  M  Distal Graft 68  B  92  T Distal Anastomosis 62  T  14  M Outflow Artery 106  T  Unreliable Today's ABI / TBI Unreliable  Unreliable Previous ABI / TBI (  ) Unreliable    Waveform:    M - Monophasic       B - Biphasic       T - Triphasic  If Ankle Brachial Index (ABI) or Toe Brachial Index (TBI) performed, please see complete report     ADDITIONAL FINDINGS: Bilateral brachial pressures are abnormal with monophasic waveforms suggestive of proximal bilateral stenosis. Ankle brachial index is rendered unreliable.     IMPRESSION: 1. Patent right lower extremity femoropopliteal arterial bypass graft with significant stenosis of the proximal anastomosis. Inflow waveform is abnormal. 2. Widely patent left femoropopliteal arterial bypass graft without evidence of restenosis or hyperplasia.     ASSESSMENT: [name] is a 48 y.o. female who is status post right femoral to below knee popliteal artery bypass graft with vein on 07/05/2010 for nonhealing ulcer, and left femoral to below knee popliteal artery bypass graft with vein on 06/27/2011. She has bilateral radiculopathy, neuropathy in upper and lower extremities, and likely claudication symptoms in her legs with overlapping symptoms from the  radiculopathy and neuropathy. There is no tissue loss in her lower extremities. Pulses in her upper extremities are not palpable, but fingers of both hands are warm and pink with brisk capillary refill.  Bilateral lower extremity arterial Duplex today reveals a patent right lower extremity femoropopliteal arterial bypass graft with significant stenosis of the proximal anastomosis. Inflow waveform is abnormal. Widely patent left femoropopliteal arterial bypass graft without evidence of restenosis or hyperplasia. Bilateral brachial pressures are abnormal with monophasic waveforms suggestive of proximal bilateral stenosis. Ankle brachial index is rendered unreliable.  Fortunately her DM control is improving, but unfortunately she continues to smoke.  The patient has intermittent diarrhea, she had an adverse reaction to Crestor; consider Welchol use which lowers LDL, tends to encourage constipation, and also lower serum glucose; will defer to pt's PCP.   PLAN:  The patient was counseled re smoking cessation and given several free resources re smoking cessation.  I discussed in depth with the patient the nature of atherosclerosis, and emphasized the importance of maximal medical management including strict control of blood pressure, blood glucose, and lipid levels, obtaining regular exercise, and cessation of smoking.  The patient is aware that without maximal medical management the underlying atherosclerotic disease process will progress, limiting the benefit of any interventions.  Based on the patient's vascular studies and examination, and after discussing with Dr. Bridgett Larsson, pt will follow up with Dr. Trula Slade in 2-4 weeks to discuss how to address worsening stenoses of LE's, particularly of right lower extremity bypass graft proximal anastomosis.  The patient was given information about PAD including signs, symptoms, treatment, what symptoms should prompt the patient to seek immediate medical care, and  risk reduction measures to take.  Clemon Chambers, RN, MSN, FNP-C [facility] Phone: [phone]  Clinic MD: Bridgett Larsson  10/07/2014 11:55 AM

## 2014-10-07 NOTE — Patient Instructions (Signed)
Peripheral Vascular Disease Peripheral Vascular Disease (PVD), also called Peripheral Arterial Disease (PAD), is a circulation problem caused by cholesterol (atherosclerotic plaque) deposits in the arteries. PVD commonly occurs in the lower extremities (legs) but it can occur in other areas of the body, such as your arms. The cholesterol buildup in the arteries reduces blood flow which can cause pain and other serious problems. The presence of PVD can place a person at risk for Coronary Artery Disease (CAD).  CAUSES  Causes of PVD can be many. It is usually associated with more than one risk factor such as:   High Cholesterol.  Smoking.  Diabetes.  Lack of exercise or inactivity.  High blood pressure (hypertension).  Obesity.  Family history. SYMPTOMS   When the lower extremities are affected, patients with PVD may experience:  Leg pain with exertion or physical activity. This is called INTERMITTENT CLAUDICATION. This may present as cramping or numbness with physical activity. The location of the pain is associated with the level of blockage. For example, blockage at the abdominal level (distal abdominal aorta) may result in buttock or hip pain. Lower leg arterial blockage may result in calf pain.  As PVD becomes more severe, pain can develop with less physical activity.  In people with severe PVD, leg pain may occur at rest.  Other PVD signs and symptoms:  Leg numbness or weakness.  Coldness in the affected leg or foot, especially when compared to the other leg.  A change in leg color.  Patients with significant PVD are more prone to ulcers or sores on toes, feet or legs. These may take longer to heal or may reoccur. The ulcers or sores can become infected.  If signs and symptoms of PVD are ignored, gangrene may occur. This can result in the loss of toes or loss of an entire limb.  Not all leg pain is related to PVD. Other medical conditions can cause leg pain such  as:  Blood clots (embolism) or Deep Vein Thrombosis.  Inflammation of the blood vessels (vasculitis).  Spinal stenosis. DIAGNOSIS  Diagnosis of PVD can involve several different types of tests. These can include:  Pulse Volume Recording Method (PVR). This test is simple, painless and does not involve the use of X-rays. PVR involves measuring and comparing the blood pressure in the arms and legs. An ABI (Ankle-Brachial Index) is calculated. The normal ratio of blood pressures is 1. As this number becomes smaller, it indicates more severe disease.  < 0.95 - indicates significant narrowing in one or more leg vessels.  <0.8 - there will usually be pain in the foot, leg or buttock with exercise.  <0.4 - will usually have pain in the legs at rest.  <0.25 - usually indicates limb threatening PVD.  Doppler detection of pulses in the legs. This test is painless and checks to see if you have a pulses in your legs/feet.  A dye or contrast material (a substance that highlights the blood vessels so they show up on x-ray) may be given to help your caregiver better see the arteries for the following tests. The dye is eliminated from your body by the kidney's. Your caregiver may order blood work to check your kidney function and other laboratory values before the following tests are performed:  Magnetic Resonance Angiography (MRA). An MRA is a picture study of the blood vessels and arteries. The MRA machine uses a large magnet to produce images of the blood vessels.  Computed Tomography Angiography (CTA). A CTA   is a specialized x-ray that looks at how the blood flows in your blood vessels. An IV may be inserted into your arm so contrast dye can be injected.  Angiogram. Is a procedure that uses x-rays to look at your blood vessels. This procedure is minimally invasive, meaning a small incision (cut) is made in your groin. A small tube (catheter) is then inserted into the artery of your groin. The catheter  is guided to the blood vessel or artery your caregiver wants to examine. Contrast dye is injected into the catheter. X-rays are then taken of the blood vessel or artery. After the images are obtained, the catheter is taken out. TREATMENT  Treatment of PVD involves many interventions which may include:  Lifestyle changes:  Quitting smoking.  Exercise.  Following a low fat, low cholesterol diet.  Control of diabetes.  Foot care is very important to the PVD patient. Good foot care can help prevent infection.  Medication:  Cholesterol-lowering medicine.  Blood pressure medicine.  Anti-platelet drugs.  Certain medicines may reduce symptoms of Intermittent Claudication.  Interventional/Surgical options:  Angioplasty. An Angioplasty is a procedure that inflates a balloon in the blocked artery. This opens the blocked artery to improve blood flow.  Stent Implant. A wire mesh tube (stent) is placed in the artery. The stent expands and stays in place, allowing the artery to remain open.  Peripheral Bypass Surgery. This is a surgical procedure that reroutes the blood around a blocked artery to help improve blood flow. This type of procedure may be performed if Angioplasty or stent implants are not an option. SEEK IMMEDIATE MEDICAL CARE IF:   You develop pain or numbness in your arms or legs.  Your arm or leg turns cold, becomes blue in color.  You develop redness, warmth, swelling and pain in your arms or legs. MAKE SURE YOU:   Understand these instructions.  Will watch your condition.  Will get help right away if you are not doing well or get worse. Document Released: 12/12/2004 Document Revised: 01/27/2012 Document Reviewed: 11/08/2008 ExitCare Patient Information 2015 [employer]. This information is not intended to replace advice given to you by your health care provider. Make sure you discuss any questions you have with your health care provider.    Smoking  Cessation Quitting smoking is important to your health and has many advantages. However, it is not always easy to quit since nicotine is a very addictive drug. Oftentimes, people try 3 times or more before being able to quit. This document explains the best ways for you to prepare to quit smoking. Quitting takes hard work and a lot of effort, but you can do it. ADVANTAGES OF QUITTING SMOKING  You will live longer, feel better, and live better.  Your body will feel the impact of quitting smoking almost immediately.  Within 20 minutes, blood pressure decreases. Your pulse returns to its normal level.  After 8 hours, carbon monoxide levels in the blood return to normal. Your oxygen level increases.  After 24 hours, the chance of having a heart attack starts to decrease. Your breath, hair, and body stop smelling like smoke.  After 48 hours, damaged nerve endings begin to recover. Your sense of taste and smell improve.  After 72 hours, the body is virtually free of nicotine. Your bronchial tubes relax and breathing becomes easier.  After 2 to 12 weeks, lungs can hold more air. Exercise becomes easier and circulation improves.  The risk of having a heart attack, stroke,   cancer, or lung disease is greatly reduced.  After 1 year, the risk of coronary heart disease is cut in half.  After 5 years, the risk of stroke falls to the same as a nonsmoker.  After 10 years, the risk of lung cancer is cut in half and the risk of other cancers decreases significantly.  After 15 years, the risk of coronary heart disease drops, usually to the level of a nonsmoker.  If you are pregnant, quitting smoking will improve your chances of having a healthy baby.  The people you live with, especially any children, will be healthier.  You will have extra money to spend on things other than cigarettes. QUESTIONS TO THINK ABOUT BEFORE ATTEMPTING TO QUIT You may want to talk about your answers with your health care  provider.  Why do you want to quit?  If you tried to quit in the past, what helped and what did not?  What will be the most difficult situations for you after you quit? How will you plan to handle them?  Who can help you through the tough times? Your family? Friends? A health care provider?  What pleasures do you get from smoking? What ways can you still get pleasure if you quit? Here are some questions to ask your health care provider:  How can you help me to be successful at quitting?  What medicine do you think would be best for me and how should I take it?  What should I do if I need more help?  What is smoking withdrawal like? How can I get information on withdrawal? GET READY  Set a quit date.  Change your environment by getting rid of all cigarettes, ashtrays, matches, and lighters in your home, car, or work. Do not let people smoke in your home.  Review your past attempts to quit. Think about what worked and what did not. GET SUPPORT AND ENCOURAGEMENT You have a better chance of being successful if you have help. You can get support in many ways.  Tell your family, friends, and coworkers that you are going to quit and need their support. Ask them not to smoke around you.  Get individual, group, or telephone counseling and support. Programs are available at local hospitals and health centers. Call your local health department for information about programs in your area.  Spiritual beliefs and practices may help some smokers quit.  Download a "quit meter" on your computer to keep track of quit statistics, such as how long you have gone without smoking, cigarettes not smoked, and money saved.  Get a self-help book about quitting smoking and staying off tobacco. LEARN NEW SKILLS AND BEHAVIORS  Distract yourself from urges to smoke. Talk to someone, go for a walk, or occupy your time with a task.  Change your normal routine. Take a different route to work. Drink tea  instead of coffee. Eat breakfast in a different place.  Reduce your stress. Take a hot bath, exercise, or read a book.  Plan something enjoyable to do every day. Reward yourself for not smoking.  Explore interactive web-based programs that specialize in helping you quit. GET MEDICINE AND USE IT CORRECTLY Medicines can help you stop smoking and decrease the urge to smoke. Combining medicine with the above behavioral methods and support can greatly increase your chances of successfully quitting smoking.  Nicotine replacement therapy helps deliver nicotine to your body without the negative effects and risks of smoking. Nicotine replacement therapy includes nicotine gum, lozenges,   inhalers, nasal sprays, and skin patches. Some may be available over-the-counter and others require a prescription.  Antidepressant medicine helps people abstain from smoking, but how this works is unknown. This medicine is available by prescription.  Nicotinic receptor partial agonist medicine simulates the effect of nicotine in your brain. This medicine is available by prescription. Ask your health care provider for advice about which medicines to use and how to use them based on your health history. Your health care provider will tell you what side effects to look out for if you choose to be on a medicine or therapy. Carefully read the information on the package. Do not use any other product containing nicotine while using a nicotine replacement product.  RELAPSE OR DIFFICULT SITUATIONS Most relapses occur within the first 3 months after quitting. Do not be discouraged if you start smoking again. Remember, most people try several times before finally quitting. You may have symptoms of withdrawal because your body is used to nicotine. You may crave cigarettes, be irritable, feel very hungry, cough often, get headaches, or have difficulty concentrating. The withdrawal symptoms are only temporary. They are strongest when you  first quit, but they will go away within 10-14 days. To reduce the chances of relapse, try to:  Avoid drinking alcohol. Drinking lowers your chances of successfully quitting.  Reduce the amount of caffeine you consume. Once you quit smoking, the amount of caffeine in your body increases and can give you symptoms, such as a rapid heartbeat, sweating, and anxiety.  Avoid smokers because they can make you want to smoke.  Do not let weight gain distract you. Many smokers will gain weight when they quit, usually less than 10 pounds. Eat a healthy diet and stay active. You can always lose the weight gained after you quit.  Find ways to improve your mood other than smoking. FOR MORE INFORMATION  www.smokefree.gov  Document Released: 10/29/2001 Document Revised: 03/21/2014 Document Reviewed: 02/13/2012 ExitCare Patient Information 2015 [employer]. This information is not intended to replace advice given to you by your health care provider. Make sure you discuss any questions you have with your health care provider.    Smoking Cessation, Tips for Success If you are ready to quit smoking, congratulations! You have chosen to help yourself be healthier. Cigarettes bring nicotine, tar, carbon monoxide, and other irritants into your body. Your lungs, heart, and blood vessels will be able to work better without these poisons. There are many different ways to quit smoking. Nicotine gum, nicotine patches, a nicotine inhaler, or nicotine nasal spray can help with physical craving. Hypnosis, support groups, and medicines help break the habit of smoking. WHAT THINGS CAN I DO TO MAKE QUITTING EASIER?  Here are some tips to help you quit for good:  Pick a date when you will quit smoking completely. Tell all of your friends and family about your plan to quit on that date.  Do not try to slowly cut down on the number of cigarettes you are smoking. Pick a quit date and quit smoking completely starting on that  day.  Throw away all cigarettes.   Clean and remove all ashtrays from your home, work, and car.  On a card, write down your reasons for quitting. Carry the card with you and read it when you get the urge to smoke.  Cleanse your body of nicotine. Drink enough water and fluids to keep your urine clear or pale yellow. Do this after quitting to flush the nicotine from   your body.  Learn to predict your moods. Do not let a bad situation be your excuse to have a cigarette. Some situations in your life might tempt you into wanting a cigarette.  Never have "just one" cigarette. It leads to wanting another and another. Remind yourself of your decision to quit.  Change habits associated with smoking. If you smoked while driving or when feeling stressed, try other activities to replace smoking. Stand up when drinking your coffee. Brush your teeth after eating. Sit in a different chair when you read the paper. Avoid alcohol while trying to quit, and try to drink fewer caffeinated beverages. Alcohol and caffeine may urge you to smoke.  Avoid foods and drinks that can trigger a desire to smoke, such as sugary or spicy foods and alcohol.  Ask people who smoke not to smoke around you.  Have something planned to do right after eating or having a cup of coffee. For example, plan to take a walk or exercise.  Try a relaxation exercise to calm you down and decrease your stress. Remember, you may be tense and nervous for the first 2 weeks after you quit, but this will pass.  Find new activities to keep your hands busy. Play with a pen, coin, or rubber band. Doodle or draw things on paper.  Brush your teeth right after eating. This will help cut down on the craving for the taste of tobacco after meals. You can also try mouthwash.   Use oral substitutes in place of cigarettes. Try using lemon drops, carrots, cinnamon sticks, or chewing gum. Keep them handy so they are available when you have the urge to  smoke.  When you have the urge to smoke, try deep breathing.  Designate your home as a nonsmoking area.  If you are a heavy smoker, ask your health care provider about a prescription for nicotine chewing gum. It can ease your withdrawal from nicotine.  Reward yourself. Set aside the cigarette money you save and buy yourself something nice.  Look for support from others. Join a support group or smoking cessation program. Ask someone at home or at work to help you with your plan to quit smoking.  Always ask yourself, "Do I need this cigarette or is this just a reflex?" Tell yourself, "Today, I choose not to smoke," or "I do not want to smoke." You are reminding yourself of your decision to quit.  Do not replace cigarette smoking with electronic cigarettes (commonly called e-cigarettes). The safety of e-cigarettes is unknown, and some may contain harmful chemicals.  If you relapse, do not give up! Plan ahead and think about what you will do the next time you get the urge to smoke. HOW WILL I FEEL WHEN I QUIT SMOKING? You may have symptoms of withdrawal because your body is used to nicotine (the addictive substance in cigarettes). You may crave cigarettes, be irritable, feel very hungry, cough often, get headaches, or have difficulty concentrating. The withdrawal symptoms are only temporary. They are strongest when you first quit but will go away within 10-14 days. When withdrawal symptoms occur, stay in control. Think about your reasons for quitting. Remind yourself that these are signs that your body is healing and getting used to being without cigarettes. Remember that withdrawal symptoms are easier to treat than the major diseases that smoking can cause.  Even after the withdrawal is over, expect periodic urges to smoke. However, these cravings are generally short lived and will go away whether you   smoke or not. Do not smoke! WHAT RESOURCES ARE AVAILABLE TO HELP ME QUIT SMOKING? Your health care  provider can direct you to community resources or hospitals for support, which may include:  Group support.  Education.  Hypnosis.  Therapy. Document Released: 08/02/2004 Document Revised: 03/21/2014 Document Reviewed: 04/22/2013 ExitCare Patient Information 2015 [employer]. This information is not intended to replace advice given to you by your health care provider. Make sure you discuss any questions you have with your health care provider.  

## 2014-10-19 ENCOUNTER — Ambulatory Visit: Payer: Self-pay | Admitting: Pain Medicine

## 2014-10-27 ENCOUNTER — Encounter (HOSPITAL_COMMUNITY): Payer: Self-pay | Admitting: Surgery

## 2014-10-28 ENCOUNTER — Encounter: Payer: Self-pay | Admitting: Surgery

## 2014-10-31 ENCOUNTER — Ambulatory Visit (INDEPENDENT_AMBULATORY_CARE_PROVIDER_SITE_OTHER): Payer: Medicare Other | Admitting: Surgery

## 2014-10-31 ENCOUNTER — Encounter: Payer: Self-pay | Admitting: Surgery

## 2014-10-31 VITALS — BP 78/54 | HR 103 | Ht 66.0 in | Wt 176.6 lb

## 2014-10-31 DIAGNOSIS — I70409 Unspecified atherosclerosis of autologous vein bypass graft(s) of the extremities, unspecified extremity: Secondary | ICD-10-CM

## 2014-10-31 NOTE — Progress Notes (Signed)
Patient name: [name] MRN: [id number] DOB: [birth year] Sex: female     Chief Complaint  Patient presents with  . Re-evaluation    2-4 wk f/u     HISTORY OF PRESENT ILLNESS: The patient comes back today for followup. She is status post right femoral to below knee popliteal artery bypass graft with vein on 07/05/2010 for nonhealing ulcer, and left femoral to below knee popliteal artery bypass graft with vein on 06/27/2011 .  She had a high-grade stenosis identified at the proximal anastomosis on the right and on 06/15/2014 she underwent drug coated balloon angioplasty with a 6 mm balloon.  Her most recent ultrasound showed recurrence of the stenosis in this area.  She continues to smoke.  She does not have any wounds.  She has no claudication symptoms.  Past Medical History  Diagnosis Date  . Diabetes mellitus   . PAD (peripheral artery disease)   . Chronic back pain   . Asthma   . Hyperlipidemia   . Leg pain     With Walking  . Depression   . Arthritis   . Ulcer     Foot  . Reflux   . Joint pain   . Peripheral arterial disease     Past Surgical History  Procedure Laterality Date  . Arterial bypass surgry  07/05/2010    Right Common Femoral to below knee popliteal BPG  . Skin graft Right 2012    [name] by Dr. Nils Pyle- Right and Left Ankle  . Back surgery      X's  2  . Tonsillectomy    . Spine surgery    . Cholecystectomy      Gall Bladder  . Abdominal aortagram  June 15, 2014  . Intercostal nerve block  November 2015  . Abdominal aortagram N/A 06/15/2014    Procedure: ABDOMINAL Maxcine Ham;  Surgeon: Serafina Mitchell, MD;  Location: [facility] CATH LAB;  Service: Cardiovascular;  Laterality: N/A;    History   Social History  . Marital Status: Legally Separated    Spouse Name: N/A    Number of Children: N/A  . Years of Education: N/A   Occupational History  . Not on file.   Social History Main Topics  . Smoking status: Current Every Day Smoker -- 0.50 packs/day  for 30 years    Types: Cigarettes  . Smokeless tobacco: Never Used  . Alcohol Use: No  . Drug Use: No  . Sexual Activity: Yes    Birth Control/ Protection: Post-menopausal   Other Topics Concern  . Not on file   Social History Narrative    Family History  Problem Relation Age of Onset  . Coronary artery disease Mother   . Peripheral vascular disease Mother   . Heart disease Mother     Before age 4  . Other Mother     Venous insuffiency  . Diabetes Mother   . Hyperlipidemia Mother   . Hypertension Mother   . Varicose Veins Mother   . Heart attack Mother   . Heart disease Father   . Diabetes Father   . Diabetes Sister   . Hypertension Sister   . Diabetes Brother   . Hypertension Brother   . Diabetes Maternal Grandmother   . Diabetes Paternal Grandmother   . Diabetes Paternal Grandfather     Allergies as of 10/31/2014 - Review Complete 10/31/2014  Allergen Reaction Noted  . Penicillins Other (See Comments) 06/24/2011    Current Outpatient  Prescriptions on File Prior to Visit  Medication Sig Dispense Refill  . acetaminophen (TYLENOL) 500 MG tablet Take 500 mg by mouth every 8 (eight) hours as needed for moderate pain.    Marland Kitchen albuterol (PROVENTIL) 2 MG tablet Take 2 mg by mouth 2 (two) times daily.     Marland Kitchen aspirin 325 MG tablet Take 325 mg by mouth daily as needed for pain.     . cetirizine (ZYRTEC) 10 MG tablet Take 10 mg by mouth daily.    . furosemide (LASIX) 40 MG tablet Take 40 mg by mouth daily as needed for fluid.     Marland Kitchen ibuprofen (ADVIL,MOTRIN) 200 MG tablet Take 200 mg by mouth every 8 (eight) hours as needed for moderate pain.    Marland Kitchen LANTUS 100 UNIT/ML injection Inject 33 Units into the skin at bedtime.     Marland Kitchen LYRICA 100 MG capsule Take 100 mg by mouth 3 (three) times daily.     . methocarbamol (ROBAXIN) 500 MG tablet Take 500 mg by mouth 3 (three) times daily as needed (for muscle spasms).     . morphine (MS CONTIN) 15 MG 12 hr tablet Take 15 mg by mouth 2 (two)  times daily as needed (severe pain). Prn only    . naproxen sodium (ANAPROX) 220 MG tablet Take 220 mg by mouth 2 (two) times daily as needed (pain).    . pseudoephedrine-guaifenesin (MUCINEX D) 60-600 MG per tablet Take 1 tablet by mouth every 12 (twelve) hours. Take half Tab twice daily     Current Facility-Administered Medications on File Prior to Visit  Medication Dose Route Frequency Provider Last Rate Last Dose  . 0.9 %  sodium chloride infusion   Intravenous Continuous Serafina Mitchell, MD         REVIEW OF SYSTEMS: See history of present illness, otherwise all systems negative  PHYSICAL EXAMINATION:   Vital signs are BP 78/54 mmHg  Pulse 103  Ht 5\' 6"  (1.676 m)  Wt 176 lb 9.6 oz (80.105 kg)  BMI 28.52 kg/m2  SpO2 99% General: The patient appears their stated age. HEENT:  No gross abnormalities Pulmonary:  Non labored breathing  Musculoskeletal: There are no major deformities. Neurologic: No focal weakness or paresthesias are detected, Skin: There are no ulcer or rashes noted. Psychiatric: The patient has normal affect.   Diagnostic Studies Her most recent ultrasound shows a velocity of 359 at the proximal anastomosis on the right.  ABI was 1.0.  Assessment: Atherosclerosis, status post bilateral femoral popliteal bypass graft Plan: I discussed the ultrasound findings with the patient.  I would like to continue to try to manage this with percutaneous aspect.  This would mean repeat angiography with cannulation of the left femoral artery, bilateral runoff and intervention on the right groin.  I used a drug coated balloon last time.  I will selected different tool on Tuesday, January 5  V. Leia Alf, M.D. [facility] Office: [phone] Pager:  [phone]

## 2014-11-04 ENCOUNTER — Other Ambulatory Visit: Payer: Self-pay

## 2014-11-08 ENCOUNTER — Ambulatory Visit: Payer: Self-pay | Admitting: Pain Medicine

## 2014-11-21 MED ORDER — SODIUM CHLORIDE 0.9 % IV SOLN
INTRAVENOUS | Status: DC
  Administered 2014-11-22: 100 mL/h via INTRAVENOUS

## 2014-11-22 ENCOUNTER — Encounter (HOSPITAL_COMMUNITY): Payer: Self-pay | Admitting: *Deleted

## 2014-11-22 ENCOUNTER — Encounter (HOSPITAL_COMMUNITY)
Admission: RE | Disposition: A | Discharge status: Home / Self Care | Payer: Self-pay | Source: Ambulatory Visit | Attending: Surgery

## 2014-11-22 ENCOUNTER — Ambulatory Visit (HOSPITAL_COMMUNITY)
Admission: RE | Admit: 2014-11-22 | Discharge: 2014-11-22 | Disposition: A | Discharge status: Home / Self Care | Payer: Medicare Other | Source: Ambulatory Visit | Attending: Surgery | Admitting: Surgery

## 2014-11-22 DIAGNOSIS — F17210 Nicotine dependence, cigarettes, uncomplicated: Secondary | ICD-10-CM | POA: Insufficient documentation

## 2014-11-22 DIAGNOSIS — Y832 Surgical operation with anastomosis, bypass or graft as the cause of abnormal reaction of the patient, or of later complication, without mention of misadventure at the time of the procedure: Secondary | ICD-10-CM | POA: Diagnosis not present

## 2014-11-22 DIAGNOSIS — T82858A Stenosis of vascular prosthetic devices, implants and grafts, initial encounter: Secondary | ICD-10-CM | POA: Insufficient documentation

## 2014-11-22 DIAGNOSIS — Z7982 Long term (current) use of aspirin: Secondary | ICD-10-CM | POA: Insufficient documentation

## 2014-11-22 DIAGNOSIS — M1990 Unspecified osteoarthritis, unspecified site: Secondary | ICD-10-CM | POA: Insufficient documentation

## 2014-11-22 DIAGNOSIS — Z791 Long term (current) use of non-steroidal anti-inflammatories (NSAID): Secondary | ICD-10-CM | POA: Diagnosis not present

## 2014-11-22 DIAGNOSIS — J45909 Unspecified asthma, uncomplicated: Secondary | ICD-10-CM | POA: Diagnosis not present

## 2014-11-22 DIAGNOSIS — I739 Peripheral vascular disease, unspecified: Secondary | ICD-10-CM | POA: Diagnosis present

## 2014-11-22 DIAGNOSIS — F329 Major depressive disorder, single episode, unspecified: Secondary | ICD-10-CM | POA: Diagnosis not present

## 2014-11-22 DIAGNOSIS — E785 Hyperlipidemia, unspecified: Secondary | ICD-10-CM | POA: Diagnosis not present

## 2014-11-22 DIAGNOSIS — K219 Gastro-esophageal reflux disease without esophagitis: Secondary | ICD-10-CM | POA: Diagnosis not present

## 2014-11-22 DIAGNOSIS — G8929 Other chronic pain: Secondary | ICD-10-CM | POA: Diagnosis not present

## 2014-11-22 DIAGNOSIS — E119 Type 2 diabetes mellitus without complications: Secondary | ICD-10-CM | POA: Insufficient documentation

## 2014-11-22 DIAGNOSIS — Z833 Family history of diabetes mellitus: Secondary | ICD-10-CM | POA: Insufficient documentation

## 2014-11-22 DIAGNOSIS — Z79899 Other long term (current) drug therapy: Secondary | ICD-10-CM | POA: Insufficient documentation

## 2014-11-22 DIAGNOSIS — M549 Dorsalgia, unspecified: Secondary | ICD-10-CM | POA: Diagnosis not present

## 2014-11-22 DIAGNOSIS — I70401 Unspecified atherosclerosis of autologous vein bypass graft(s) of the extremities, right leg: Secondary | ICD-10-CM

## 2014-11-22 HISTORY — PX: ABDOMINAL AORTAGRAM: SHX5454

## 2014-11-22 LAB — GLUCOSE, CAPILLARY
GLUCOSE-CAPILLARY: 251 mg/dL — AB (ref 70–99)
Glucose-Capillary: 479 mg/dL — ABNORMAL HIGH (ref 70–99)
Glucose-Capillary: 297 mg/dL — ABNORMAL HIGH (ref 70–99)

## 2014-11-22 LAB — POCT I-STAT, CHEM 8
BUN: 22 mg/dL (ref 6–23)
Calcium, Ion: 1.12 mmol/L (ref 1.12–1.23)
Chloride: 88 mEq/L — ABNORMAL LOW (ref 96–112)
Creatinine, Ser: 0.8 mg/dL (ref 0.50–1.10)
GLUCOSE: 489 mg/dL — AB (ref 70–99)
HCT: 45 % (ref 36.0–46.0)
HEMOGLOBIN: 15.3 g/dL — AB (ref 12.0–15.0)
POTASSIUM: 3 mmol/L — AB (ref 3.5–5.1)
Sodium: 131 mmol/L — ABNORMAL LOW (ref 135–145)
TCO2: 31 mmol/L (ref 0–100)

## 2014-11-22 LAB — POCT ACTIVATED CLOTTING TIME
ACTIVATED CLOTTING TIME: 165 s
Activated Clotting Time: 233 seconds

## 2014-11-22 SURGERY — ABDOMINAL AORTAGRAM
Anesthesia: LOCAL | Laterality: Right

## 2014-11-22 MED ORDER — METOPROLOL TARTRATE 1 MG/ML IV SOLN
2.0000 mg | INTRAVENOUS | Status: DC | PRN
Start: 2014-11-22 — End: 2014-11-22

## 2014-11-22 MED ORDER — ACETAMINOPHEN 325 MG PO TABS: 325.0000 mg | ORAL_TABLET | ORAL | Status: DC | PRN

## 2014-11-22 MED ORDER — HYDRALAZINE HCL 20 MG/ML IJ SOLN: 5.0000 mg | INTRAMUSCULAR | Status: DC | PRN

## 2014-11-22 MED ORDER — GUAIFENESIN-DM 100-10 MG/5ML PO SYRP: 15.0000 mL | ORAL_SOLUTION | ORAL | Status: DC | PRN

## 2014-11-22 MED ORDER — HEPARIN SODIUM (PORCINE) 1000 UNIT/ML IJ SOLN
INTRAMUSCULAR | Status: AC
  Filled 2014-11-22: qty 1

## 2014-11-22 MED ORDER — ONDANSETRON HCL 4 MG/2ML IJ SOLN: 4.0000 mg | Freq: Four times a day (QID) | INTRAMUSCULAR | Status: DC | PRN

## 2014-11-22 MED ORDER — MIDAZOLAM HCL 2 MG/2ML IJ SOLN
INTRAMUSCULAR | Status: AC
  Filled 2014-11-22: qty 2

## 2014-11-22 MED ORDER — POTASSIUM CHLORIDE CRYS ER 20 MEQ PO TBCR
EXTENDED_RELEASE_TABLET | ORAL | Status: AC
Start: 2014-11-22 — End: 2014-11-22
  Administered 2014-11-22: 20 meq via ORAL
  Filled 2014-11-22: qty 1

## 2014-11-22 MED ORDER — LABETALOL HCL 5 MG/ML IV SOLN: 10.0000 mg | INTRAVENOUS | Status: DC | PRN

## 2014-11-22 MED ORDER — SODIUM CHLORIDE 0.9 % IV SOLN: 1.0000 mL/kg/h | INTRAVENOUS | Status: DC

## 2014-11-22 MED ORDER — ACETAMINOPHEN 325 MG RE SUPP: 325.0000 mg | RECTAL | Status: DC | PRN

## 2014-11-22 MED ORDER — INSULIN ASPART 100 UNIT/ML ~~LOC~~ SOLN
15.0000 [IU] | Freq: Once | SUBCUTANEOUS | Status: AC
  Administered 2014-11-22: 15 [IU] via SUBCUTANEOUS

## 2014-11-22 MED ORDER — HEPARIN (PORCINE) IN NACL 2-0.9 UNIT/ML-% IJ SOLN
INTRAMUSCULAR | Status: AC
  Filled 2014-11-22: qty 500

## 2014-11-22 MED ORDER — ALUM & MAG HYDROXIDE-SIMETH 200-200-20 MG/5ML PO SUSP: 15.0000 mL | ORAL | Status: DC | PRN

## 2014-11-22 MED ORDER — LIDOCAINE HCL (PF) 1 % IJ SOLN
INTRAMUSCULAR | Status: AC
  Filled 2014-11-22: qty 30

## 2014-11-22 MED ORDER — POTASSIUM CHLORIDE CRYS ER 20 MEQ PO TBCR
20.0000 meq | EXTENDED_RELEASE_TABLET | Freq: Once | ORAL | Status: AC
  Administered 2014-11-22: 20 meq via ORAL

## 2014-11-22 MED ORDER — INSULIN ASPART 100 UNIT/ML ~~LOC~~ SOLN
SUBCUTANEOUS | Status: AC
  Administered 2014-11-22: 15 [IU] via SUBCUTANEOUS
  Filled 2014-11-22: qty 1

## 2014-11-22 MED ORDER — PHENOL 1.4 % MT LIQD: 1.0000 | OROMUCOSAL | Status: DC | PRN

## 2014-11-22 MED ORDER — FENTANYL CITRATE 0.05 MG/ML IJ SOLN
INTRAMUSCULAR | Status: AC
  Filled 2014-11-22: qty 2

## 2014-11-22 SURGICAL SUPPLY — 55 items
ADH SKN CLS APL DERMABOND .7 (GAUZE/BANDAGES/DRESSINGS) ×2
BANDAGE ELASTIC 4 VELCRO ST LF (GAUZE/BANDAGES/DRESSINGS) IMPLANT
BANDAGE ESMARK 6X9 LF (GAUZE/BANDAGES/DRESSINGS) IMPLANT
BNDG CMPR 9X6 STRL LF SNTH (GAUZE/BANDAGES/DRESSINGS)
BNDG ESMARK 6X9 LF (GAUZE/BANDAGES/DRESSINGS)
CANISTER SUCTION 2500CC (MISCELLANEOUS) ×3 IMPLANT
CLIP TI MEDIUM 24 (CLIP) ×3 IMPLANT
CLIP TI WIDE RED SMALL 24 (CLIP) ×3 IMPLANT
COVER SURGICAL LIGHT HANDLE (MISCELLANEOUS) ×3 IMPLANT
CUFF TOURNIQUET SINGLE 24IN (TOURNIQUET CUFF) IMPLANT
CUFF TOURNIQUET SINGLE 34IN LL (TOURNIQUET CUFF) IMPLANT
CUFF TOURNIQUET SINGLE 44IN (TOURNIQUET CUFF) IMPLANT
DERMABOND ADVANCED (GAUZE/BANDAGES/DRESSINGS) ×1
DERMABOND ADVANCED .7 DNX12 (GAUZE/BANDAGES/DRESSINGS) ×2 IMPLANT
DRAIN CHANNEL 15F RND FF W/TCR (WOUND CARE) IMPLANT
DRAPE WARM FLUID 44X44 (DRAPE) ×3 IMPLANT
DRAPE X-RAY CASS 24X20 (DRAPES) IMPLANT
DRSG COVADERM 4X10 (GAUZE/BANDAGES/DRESSINGS) IMPLANT
DRSG COVADERM 4X8 (GAUZE/BANDAGES/DRESSINGS) IMPLANT
ELECT REM PT RETURN 9FT ADLT (ELECTROSURGICAL) ×3
ELECTRODE REM PT RTRN 9FT ADLT (ELECTROSURGICAL) ×2 IMPLANT
EVACUATOR SILICONE 100CC (DRAIN) IMPLANT
GLOVE BIOGEL PI IND STRL 7.5 (GLOVE) ×2 IMPLANT
GLOVE BIOGEL PI INDICATOR 7.5 (GLOVE) ×1
GLOVE SURG SS PI 7.5 STRL IVOR (GLOVE) ×3 IMPLANT
GOWN PREVENTION PLUS XXLARGE (GOWN DISPOSABLE) ×3 IMPLANT
GOWN STRL NON-REIN LRG LVL3 (GOWN DISPOSABLE) ×9 IMPLANT
HEMOSTAT SNOW SURGICEL 2X4 (HEMOSTASIS) IMPLANT
KIT BASIN OR (CUSTOM PROCEDURE TRAY) ×3 IMPLANT
KIT ROOM TURNOVER OR (KITS) ×3 IMPLANT
MARKER GRAFT CORONARY BYPASS (MISCELLANEOUS) IMPLANT
NS IRRIG 1000ML POUR BTL (IV SOLUTION) ×6 IMPLANT
PACK PERIPHERAL VASCULAR (CUSTOM PROCEDURE TRAY) ×3 IMPLANT
PAD ARMBOARD 7.5X6 YLW CONV (MISCELLANEOUS) ×6 IMPLANT
PADDING CAST COTTON 6X4 STRL (CAST SUPPLIES) IMPLANT
SET COLLECT BLD 21X3/4 12 (NEEDLE) IMPLANT
STOPCOCK 4 WAY LG BORE MALE ST (IV SETS) IMPLANT
SUT ETHILON 3 0 PS 1 (SUTURE) IMPLANT
SUT PROLENE 5 0 C 1 24 (SUTURE) ×3 IMPLANT
SUT PROLENE 6 0 BV (SUTURE) ×3 IMPLANT
SUT PROLENE 7 0 BV 1 (SUTURE) IMPLANT
SUT SILK 2 0 SH (SUTURE) ×3 IMPLANT
SUT SILK 3 0 (SUTURE)
SUT SILK 3-0 18XBRD TIE 12 (SUTURE) IMPLANT
SUT VIC AB 2-0 CT1 27 (SUTURE) ×6
SUT VIC AB 2-0 CT1 TAPERPNT 27 (SUTURE) ×4 IMPLANT
SUT VIC AB 3-0 SH 27 (SUTURE) ×6
SUT VIC AB 3-0 SH 27X BRD (SUTURE) ×4 IMPLANT
SUT VICRYL 4-0 PS2 18IN ABS (SUTURE) ×6 IMPLANT
TOWEL OR 17X24 6PK STRL BLUE (TOWEL DISPOSABLE) ×6 IMPLANT
TOWEL OR 17X26 10 PK STRL BLUE (TOWEL DISPOSABLE) ×6 IMPLANT
TRAY FOLEY CATH 16FRSI W/METER (SET/KITS/TRAYS/PACK) ×3 IMPLANT
TUBING EXTENTION W/L.L. (IV SETS) IMPLANT
UNDERPAD 30X30 INCONTINENT (UNDERPADS AND DIAPERS) ×3 IMPLANT
WATER STERILE IRR 1000ML POUR (IV SOLUTION) ×3 IMPLANT

## 2014-11-22 NOTE — Progress Notes (Signed)
2 lpm   11/22/14 1110  Oxygen Therapy  O2 Flow Rate (L/min) (2 lpm)

## 2014-11-22 NOTE — H&P (View-Only) (Signed)
Patient name: [name] MRN: [id number] DOB: 03/27/1966 Sex: female     Chief Complaint  Patient presents with  . Re-evaluation    2-4 wk f/u     HISTORY OF PRESENT ILLNESS: The patient comes back today for followup. She is status post right femoral to below knee popliteal artery bypass graft with vein on 07/05/2010 for nonhealing ulcer, and left femoral to below knee popliteal artery bypass graft with vein on 06/27/2011 .  She had a high-grade stenosis identified at the proximal anastomosis on the right and on 06/15/2014 she underwent drug coated balloon angioplasty with a 6 mm balloon.  Her most recent ultrasound showed recurrence of the stenosis in this area.  She continues to smoke.  She does not have any wounds.  She has no claudication symptoms.  Past Medical History  Diagnosis Date  . Diabetes mellitus   . PAD (peripheral artery disease)   . Chronic back pain   . Asthma   . Hyperlipidemia   . Leg pain     With Walking  . Depression   . Arthritis   . Ulcer     Foot  . Reflux   . Joint pain   . Peripheral arterial disease     Past Surgical History  Procedure Laterality Date  . Arterial bypass surgry  07/05/2010    Right Common Femoral to below knee popliteal BPG  . Skin graft Right 2012    [name] by Dr. Nils Pyle- Right and Left Ankle  . Back surgery      X's  2  . Tonsillectomy    . Spine surgery    . Cholecystectomy      Gall Bladder  . Abdominal aortagram  June 15, 2014  . Intercostal nerve block  November 2015  . Abdominal aortagram N/A 06/15/2014    Procedure: ABDOMINAL Maxcine Ham;  Surgeon: Serafina Mitchell, MD;  Location: [facility] CATH LAB;  Service: Cardiovascular;  Laterality: N/A;    History   Social History  . Marital Status: Legally Separated    Spouse Name: N/A    Number of Children: N/A  . Years of Education: N/A   Occupational History  . Not on file.   Social History Main Topics  . Smoking status: Current Every Day Smoker -- 0.50 packs/day  for 30 years    Types: Cigarettes  . Smokeless tobacco: Never Used  . Alcohol Use: No  . Drug Use: No  . Sexual Activity: Yes    Birth Control/ Protection: Post-menopausal   Other Topics Concern  . Not on file   Social History Narrative    Family History  Problem Relation Age of Onset  . Coronary artery disease Mother   . Peripheral vascular disease Mother   . Heart disease Mother     Before age 73  . Other Mother     Venous insuffiency  . Diabetes Mother   . Hyperlipidemia Mother   . Hypertension Mother   . Varicose Veins Mother   . Heart attack Mother   . Heart disease Father   . Diabetes Father   . Diabetes Sister   . Hypertension Sister   . Diabetes Brother   . Hypertension Brother   . Diabetes Maternal Grandmother   . Diabetes Paternal Grandmother   . Diabetes Paternal Grandfather     Allergies as of 10/31/2014 - Review Complete 10/31/2014  Allergen Reaction Noted  . Penicillins Other (See Comments) 06/24/2011    Current Outpatient  Prescriptions on File Prior to Visit  Medication Sig Dispense Refill  . acetaminophen (TYLENOL) 500 MG tablet Take 500 mg by mouth every 8 (eight) hours as needed for moderate pain.    Marland Kitchen albuterol (PROVENTIL) 2 MG tablet Take 2 mg by mouth 2 (two) times daily.     Marland Kitchen aspirin 325 MG tablet Take 325 mg by mouth daily as needed for pain.     . cetirizine (ZYRTEC) 10 MG tablet Take 10 mg by mouth daily.    . furosemide (LASIX) 40 MG tablet Take 40 mg by mouth daily as needed for fluid.     Marland Kitchen ibuprofen (ADVIL,MOTRIN) 200 MG tablet Take 200 mg by mouth every 8 (eight) hours as needed for moderate pain.    Marland Kitchen LANTUS 100 UNIT/ML injection Inject 33 Units into the skin at bedtime.     Marland Kitchen LYRICA 100 MG capsule Take 100 mg by mouth 3 (three) times daily.     . methocarbamol (ROBAXIN) 500 MG tablet Take 500 mg by mouth 3 (three) times daily as needed (for muscle spasms).     . morphine (MS CONTIN) 15 MG 12 hr tablet Take 15 mg by mouth 2 (two)  times daily as needed (severe pain). Prn only    . naproxen sodium (ANAPROX) 220 MG tablet Take 220 mg by mouth 2 (two) times daily as needed (pain).    . pseudoephedrine-guaifenesin (MUCINEX D) 60-600 MG per tablet Take 1 tablet by mouth every 12 (twelve) hours. Take half Tab twice daily     Current Facility-Administered Medications on File Prior to Visit  Medication Dose Route Frequency Provider Last Rate Last Dose  . 0.9 %  sodium chloride infusion   Intravenous Continuous Serafina Mitchell, MD         REVIEW OF SYSTEMS: See history of present illness, otherwise all systems negative  PHYSICAL EXAMINATION:   Vital signs are BP 78/54 mmHg  Pulse 103  Ht 5\' 6"  (1.676 m)  Wt 176 lb 9.6 oz (80.105 kg)  BMI 28.52 kg/m2  SpO2 99% General: The patient appears their stated age. HEENT:  No gross abnormalities Pulmonary:  Non labored breathing  Musculoskeletal: There are no major deformities. Neurologic: No focal weakness or paresthesias are detected, Skin: There are no ulcer or rashes noted. Psychiatric: The patient has normal affect.   Diagnostic Studies Her most recent ultrasound shows a velocity of 359 at the proximal anastomosis on the right.  ABI was 1.0.  Assessment: Atherosclerosis, status post bilateral femoral popliteal bypass graft Plan: I discussed the ultrasound findings with the patient.  I would like to continue to try to manage this with percutaneous aspect.  This would mean repeat angiography with cannulation of the left femoral artery, bilateral runoff and intervention on the right groin.  I used a drug coated balloon last time.  I will selected different tool on Tuesday, January 5  V. Leia Alf, M.D. [facility] [place] Office: [phone] Pager:  [phone]

## 2014-11-22 NOTE — Progress Notes (Signed)
Dr Trula Slade paged. Page return by MD. Md is aware of pts bp, No new orders given.   Marland Kitchen

## 2014-11-22 NOTE — Interval H&P Note (Signed)
History and Physical Interval Note:  11/22/2014 8:35 AM  [name]  has presented today for surgery, with the diagnosis of pvd with bilateral claudication  The various methods of treatment have been discussed with the patient and family. After consideration of risks, benefits and other options for treatment, the patient has consented to  Procedure(s): ABDOMINAL AORTAGRAM (N/A) as a surgical intervention .  The patient's history has been reviewed, patient examined, no change in status, stable for surgery.  I have reviewed the patient's chart and labs.  Questions were answered to the patient's satisfaction.     BRABHAM IV, V. WELLS

## 2014-11-22 NOTE — Discharge Instructions (Signed)
Angiogram, Care After °Refer to this sheet in the next few weeks. These instructions provide you with information on caring for yourself after your procedure. Your health care provider may also give you more specific instructions. Your treatment has been planned according to current medical practices, but problems sometimes occur. Call your health care provider if you have any problems or questions after your procedure.  °WHAT TO EXPECT AFTER THE PROCEDURE °After your procedure, it is typical to have the following sensations: °· Minor discomfort or tenderness and a small bump at the catheter insertion site. The bump should usually decrease in size and tenderness within 1 to 2 weeks. °· Any bruising will usually fade within 2 to 4 weeks. °HOME CARE INSTRUCTIONS  °· You may need to keep taking blood thinners if they were prescribed for you. Take medicines only as directed by your health care provider. °· Do not apply powder or lotion to the site. °· Do not take baths, swim, or use a hot tub until your health care provider approves. °· You may shower 24 hours after the procedure. Remove the bandage (dressing) and gently wash the site with plain soap and water. Gently pat the site dry. °· Inspect the site at least twice daily. °· Limit your activity for the first 48 hours. Do not bend, squat, or lift anything over 20 lb (9 kg) or as directed by your health care provider. °· Plan to have someone take you home after the procedure. Follow instructions about when you can drive or return to work. °SEEK MEDICAL CARE IF: °· You get light-headed when standing up. °· You have drainage (other than a small amount of blood on the dressing). °· You have chills. °· You have a fever. °· You have redness, warmth, swelling, or pain at the insertion site. °SEEK IMMEDIATE MEDICAL CARE IF:  °· You develop chest pain or shortness of breath, feel faint, or pass out. °· You have bleeding, swelling larger than a walnut, or drainage from the  catheter insertion site. °· You develop pain, discoloration, coldness, or severe bruising in the leg or arm that held the catheter. °· You develop bleeding from any other place, such as the bowels. You may see bright red blood in your urine or stools, or your stools may appear black and tarry. °· You have heavy bleeding from the site. If this happens, hold pressure on the site. °MAKE SURE YOU: °· Understand these instructions. °· Will watch your condition. °· Will get help right away if you are not doing well or get worse. °Document Released: 05/23/2005 Document Revised: 03/21/2014 Document Reviewed: 03/29/2013 °Exit[employer]® Patient Information ©2015 [employer]. This information is not intended to replace advice given to you by your health care provider. Make sure you discuss any questions you have with your health care provider. ° ° °

## 2014-11-22 NOTE — Progress Notes (Signed)
Dr Trula Slade notified of B/P 86/59 and ok to d/c home if client feels ok and she states I feel normal

## 2014-11-22 NOTE — Op Note (Signed)
Patient name: [name] MRN: [id number] DOB: 03/18/1966 Sex: female  11/22/2014 Pre-operative Diagnosis: Bypass graft stenosis, right leg Post-operative diagnosis:  Same Surgeon:  Eldridge Abrahams Procedure Performed:  1.  Ultrasound-guided access, left femoral artery  2.  Abdominal aortogram  3.  Right lower extremity runoff  4.  Angioplasty, right common femoral artery  5.  Drug coated balloon angioplasty right femoral-popliteal bypass graft  6.  Stent, right femoral-popliteal bypass graft   Indications:  Patient has undergone bilateral revascularization.  She developed a proximal bypass graft stenosis on the right leg approximately 6 months ago.  This was treated with drug coated balloon angioplasty.  This lesion has recurred.  She comes in for further evaluation.  Procedure:  The patient was identified in the holding area and taken to room 8.  The patient was then placed supine on the table and prepped and draped in the usual sterile fashion.  A time out was called.  Ultrasound was used to evaluate the left common femoral artery.  It was patent .  A digital ultrasound image was acquired.  A micropuncture needle was used to access the left common femoral artery under ultrasound guidance.  An 018 wire was advanced without resistance and a micropuncture sheath was placed.  The 018 wire was removed and a benson wire was placed.  The micropuncture sheath was exchanged for a 5 french sheath.  An omniflush catheter was advanced over the wire to the level of L-1.  An abdominal angiogram was obtained.  Next, using the omniflush catheter and a benson wire, the aortic bifurcation was crossed and the catheter was placed into theright external iliac artery and right runoff was obtained.    Findings:   Aortogram:  No significant renal artery stenosis.  The infrarenal abdominal aorta is widely patent.  The left common and external iliac artery widely patent.  The right common iliac artery is  widely patent.  There is diffuse disease throughout the right external iliac artery the internal iliac artery is occluded  Right Lower Extremity:  The right common femoral artery is patent.  The profunda branches show significant atherosclerotic disease with focal stenosis.  The superficial femoral artery is occluded.  There is a bypass graft from the common femoral artery to the below-knee popliteal artery which is patent.  There is a high-grade approximately 85% stenosis for the first 2 cm of the bypass graft.  The distal anastomosis is widely patent with three-vessel runoff to the ankle.  The dominant runoff vessels of the posterior tibial and peroneal.   Intervention:  After the above images were acquired, the decision was made to proceed with intervention.  Over a 035 wire a 6 French sheath was advanced into the right external iliac artery area a 014 wire was advanced into the bypass graft.  The patient was fully heparinized I initially performed chocolate balloon angioplasty with a 6 x 40 balloon in the proximal bypass graft.  Follow-up imaging revealed sluggish flow in the common femoral artery.  It was felt that there was a flap in this area, which occurred at her prior angiogram.  I therefore withdrew the chocolate balloon into the common femoral artery and performed angioplasty of the left common femoral artery.  I then proceeded with drug coated balloon angioplasty of the proximal bypass graft using a 6 x 40 Lutonix balloon.  Completion imaging revealed a dissection within the proximal bypass graft as well as the common femoral artery.  I reinserted the balloon and performed a nominal balloon angioplasty at 4 atm for 4 minutes.  Follow-up imaging showed continued dissection.  Because of the patient's early recurrence of her stenosis, I was not comfortable leaving this.  I felt the best option was to treat this with a stent.  I had to up size to a 7 [place] sheath in order to be able to inject around  the stent, as it needed to be positioned right at the origin of the bypass graft.  A 6 x 30 Cordis self-expanding stent was deployed over the dissection and the proximal bypass graft and molded with a 6 mm balloon.  Completion imaging revealed persistent non-flow-limiting dissection within the common femoral artery and resolution of the dissection within the proximal bypass graft.  Additional images of the leg showed no significant changes.  At this point the sheath was withdrawn to the left external iliac artery and the patient taken the holding area for sheath pull once her coagulation profile corrects.  Impression:  #1  high-grade stenosis identified at the proximal bypass graft.  This was initially treated with balloon angioplasty, however a dissection was created that did not respond to repeat balloon angioplasty.  Therefore a 6 x 30 self-expanding stent was deployed.  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     Theotis Burrow, M.D. [facility] [place] Office: [phone] Pager:  [phone]

## 2014-11-22 NOTE — Progress Notes (Signed)
7 french long sheath removed from left femoral artery and pressure held x 20 minutes.  NBP in the 70's/40's, HR-80's, RR-10, SATS-94% on 2lpm [place].  Site dressed with 4x4 and tape.  Site looks good with no hematoma, level 0.  ACT prior to sheath removal was 165.  Left DP dopplers present.  Patient is still very sleepy from sedation meds so we will continue to monitor patient until she is more awake.

## 2014-12-16 ENCOUNTER — Other Ambulatory Visit: Payer: Self-pay | Admitting: *Deleted

## 2014-12-16 DIAGNOSIS — I739 Peripheral vascular disease, unspecified: Secondary | ICD-10-CM

## 2014-12-16 DIAGNOSIS — Z48812 Encounter for surgical aftercare following surgery on the circulatory system: Secondary | ICD-10-CM

## 2014-12-21 ENCOUNTER — Ambulatory Visit: Payer: Self-pay | Admitting: Pain Medicine

## 2015-01-16 ENCOUNTER — Ambulatory Visit: Payer: Self-pay | Admitting: Pain Medicine

## 2015-02-16 ENCOUNTER — Ambulatory Visit
Admit: 2015-02-16 | Disposition: A | Discharge status: Home / Self Care | Payer: Self-pay | Attending: Pain Medicine | Admitting: Pain Medicine

## 2015-03-03 ENCOUNTER — Encounter: Payer: Self-pay | Admitting: Surgery

## 2015-03-06 ENCOUNTER — Ambulatory Visit (INDEPENDENT_AMBULATORY_CARE_PROVIDER_SITE_OTHER)
Admission: RE | Admit: 2015-03-06 | Discharge: 2015-03-06 | Disposition: A | Discharge status: Home / Self Care | Payer: Medicare Other | Source: Ambulatory Visit | Attending: Surgery | Admitting: Surgery

## 2015-03-06 ENCOUNTER — Ambulatory Visit (INDEPENDENT_AMBULATORY_CARE_PROVIDER_SITE_OTHER): Payer: Medicare Other | Admitting: Surgery

## 2015-03-06 ENCOUNTER — Encounter: Payer: Self-pay | Admitting: Surgery

## 2015-03-06 ENCOUNTER — Ambulatory Visit (HOSPITAL_COMMUNITY)
Admission: RE | Admit: 2015-03-06 | Discharge: 2015-03-06 | Disposition: A | Discharge status: Home / Self Care | Payer: Medicare Other | Source: Ambulatory Visit | Attending: Surgery | Admitting: Surgery

## 2015-03-06 VITALS — BP 132/72 | HR 98 | Ht 66.0 in | Wt 167.5 lb

## 2015-03-06 DIAGNOSIS — Z48812 Encounter for surgical aftercare following surgery on the circulatory system: Secondary | ICD-10-CM

## 2015-03-06 DIAGNOSIS — I70409 Unspecified atherosclerosis of autologous vein bypass graft(s) of the extremities, unspecified extremity: Secondary | ICD-10-CM

## 2015-03-06 DIAGNOSIS — Z9862 Peripheral vascular angioplasty status: Secondary | ICD-10-CM

## 2015-03-06 DIAGNOSIS — I739 Peripheral vascular disease, unspecified: Secondary | ICD-10-CM | POA: Diagnosis not present

## 2015-03-06 NOTE — Progress Notes (Signed)
Patient name: [name] MRN: [id number] DOB: [birth year] Sex: female     Chief Complaint  Patient presents with  . Re-evaluation    3 month f/u     HISTORY OF PRESENT ILLNESS: The patient comes back today for followup. She is status post right femoral to below knee popliteal artery bypass graft with vein on 07/05/2010 for nonhealing ulcer, and left femoral to below knee popliteal artery bypass graft with vein on 06/27/2011 . She had a high-grade stenosis identified at the proximal anastomosis on the right and on 06/15/2014 she underwent drug coated balloon angioplasty with a 6 mm balloon.Follow-up ultrasound revealed a recurrence of the stenosis and she was taken back for angiography on 11/22/2014.  She had a recurrent high-grade stenosis within the proximal bypass graft.  A dissection was created after balloon angioplasty that did not respond to repeat angioplasty therefore a 6 x 30 self-expanding stent was deployed  She denies having any open wounds.  She did fall from a vertigo attack and landed on a steel toed boots in her back surgery spot has had back pain ever sense.  This has been for about a month.  She is only smoking 5 cigarettes now  Past Medical History  Diagnosis Date  . Diabetes mellitus   . PAD (peripheral artery disease)   . Chronic back pain   . Asthma   . Hyperlipidemia   . Leg pain     With Walking  . Depression   . Arthritis   . Ulcer     Foot  . Reflux   . Joint pain   . Peripheral arterial disease     Past Surgical History  Procedure Laterality Date  . Arterial bypass surgry  07/05/2010    Right Common Femoral to below knee popliteal BPG  . Skin graft Right 2012    [name] by Dr. Nils Pyle- Right and Left Ankle  . Back surgery      X's  2  . Tonsillectomy    . Spine surgery    . Cholecystectomy      Gall Bladder  . Abdominal aortagram  June 15, 2014  . Intercostal nerve block  November 2015  . Abdominal aortagram N/A 06/15/2014    Procedure:  ABDOMINAL Maxcine Ham;  Surgeon: Serafina Mitchell, MD;  Location: [facility] CATH LAB;  Service: Cardiovascular;  Laterality: N/A;  . Abdominal aortagram N/A 11/22/2014    Procedure: ABDOMINAL AORTAGRAM;  Surgeon: Serafina Mitchell, MD;  Location: [facility] CATH LAB;  Service: Cardiovascular;  Laterality: N/A;    History   Social History  . Marital Status: Single    Spouse Name: N/A  . Number of Children: N/A  . Years of Education: N/A   Occupational History  . Not on file.   Social History Main Topics  . Smoking status: Current Every Day Smoker -- 0.50 packs/day for 30 years    Types: Cigarettes  . Smokeless tobacco: Never Used  . Alcohol Use: No  . Drug Use: No  . Sexual Activity: Yes    Birth Control/ Protection: Post-menopausal   Other Topics Concern  . Not on file   Social History Narrative    Family History  Problem Relation Age of Onset  . Coronary artery disease Mother   . Peripheral vascular disease Mother   . Heart disease Mother     Before age 64  . Other Mother     Venous insuffiency  . Diabetes Mother   .  Hyperlipidemia Mother   . Hypertension Mother   . Varicose Veins Mother   . Heart attack Mother   . Heart disease Father   . Diabetes Father   . Diabetes Sister   . Hypertension Sister   . Diabetes Brother   . Hypertension Brother   . Diabetes Maternal Grandmother   . Diabetes Paternal Grandmother   . Diabetes Paternal Grandfather     Allergies as of 03/06/2015 - Review Complete 03/06/2015  Allergen Reaction Noted  . Penicillins Other (See Comments) 06/24/2011    Current Outpatient Prescriptions on File Prior to Visit  Medication Sig Dispense Refill  . albuterol (PROVENTIL) 2 MG tablet Take 2 mg by mouth 3 (three) times daily.     Marland Kitchen aspirin 325 MG tablet Take 81 mg by mouth daily as needed for pain.     . cetirizine (ZYRTEC) 10 MG tablet Take 10 mg by mouth daily.    . cyclobenzaprine (FLEXERIL) 10 MG tablet Take 10 mg by mouth 3 (three) times daily as needed  for muscle spasms.    . furosemide (LASIX) 40 MG tablet Take 40 mg by mouth daily as needed for fluid.     Marland Kitchen LANTUS SOLOSTAR 100 UNIT/ML Solostar Pen Inject 50 Units into the skin at bedtime.    Marland Kitchen LYRICA 100 MG capsule Take 100 mg by mouth 3 (three) times daily.     Marland Kitchen acetaminophen (TYLENOL) 500 MG tablet Take 500 mg by mouth every 8 (eight) hours as needed for moderate pain.    Marland Kitchen ibuprofen (ADVIL,MOTRIN) 200 MG tablet Take 200 mg by mouth every 8 (eight) hours as needed for moderate pain.    . naproxen sodium (ANAPROX) 220 MG tablet Take 220 mg by mouth 2 (two) times daily as needed (pain).     Current Facility-Administered Medications on File Prior to Visit  Medication Dose Route Frequency Provider Last Rate Last Dose  . 0.9 %  sodium chloride infusion   Intravenous Continuous Serafina Mitchell, MD         REVIEW OF SYSTEMS: See history of present illness, otherwise negative  PHYSICAL EXAMINATION:   Vital signs are  Filed Vitals:   03/06/15 1207  BP: 132/72  Pulse: 98  Height: 5\' 6"  (1.676 m)  Weight: 167 lb 8 oz (75.978 kg)  SpO2: 95%   Body mass index is 27.05 kg/(m^2). General: The patient appears their stated age. HEENT:  No gross abnormalities Pulmonary:  Non labored breathing Musculoskeletal: There are no major deformities. Neurologic: No focal weakness or paresthesias are detected, Skin: There are no ulcer or rashes noted. Psychiatric: The patient has normal affect. Cardiovascular:   Nonpalpable pedal pulses   Diagnostic Studies I have reviewed her ultrasound today.  Ankle-brachial index on the right leg is 1.01.  On the left leg is 1.31.  No significant velocity elevations were noted within either lower extremity bypass.  Assessment: Status post bilateral lower extremity bypass grafts Plan: Ultrasounds look good today.  She will follow up in 6 months with repeat duplex and ABIs.  I congratulated her on cutting back to 5 cigarettes today and encouraged her to  continue to quit completely.  Eldridge Abrahams, M.D. [facility] [place] Office: [phone] Pager:  [phone]

## 2015-03-13 NOTE — Addendum Note (Signed)
Addended by: Mena Goes on: 03/13/2015 04:13 PM   Modules accepted: Orders

## 2015-03-15 ENCOUNTER — Ambulatory Visit
Admit: 2015-03-15 | Disposition: A | Discharge status: Home / Self Care | Payer: Self-pay | Attending: Pain Medicine | Admitting: Pain Medicine

## 2015-03-26 DIAGNOSIS — M5137 Other intervertebral disc degeneration, lumbosacral region: Secondary | ICD-10-CM | POA: Insufficient documentation

## 2015-03-26 DIAGNOSIS — M533 Sacrococcygeal disorders, not elsewhere classified: Secondary | ICD-10-CM | POA: Insufficient documentation

## 2015-03-26 DIAGNOSIS — M5481 Occipital neuralgia: Secondary | ICD-10-CM | POA: Insufficient documentation

## 2015-03-26 DIAGNOSIS — M5030 Other cervical disc degeneration, unspecified cervical region: Secondary | ICD-10-CM | POA: Insufficient documentation

## 2015-03-26 NOTE — Patient Instructions (Addendum)
Continue present medications and take antibiotic.   F/U PCP for evaluation of BP and general medical condition.  F/U neurological evaluation.  .F/U surgical evaluation.   May consider radiofrequency rhizolysis, intraspinal implantation, and other procedures  Patient to call Pain Management Center for any concerns prior to scheduled appointment.  Patient is to call Pain Management Center should the patient have concerns prior to return appointmen Pain Management Discharge Instructions  General Discharge Instructions :  If you need to reach your doctor call: Monday-Friday 8:00 am - 4:00 pm at [phone] or toll free [phone].  After clinic hours [phone] to have operator reach doctor.  Bring all of your medication bottles to all your appointments in the pain clinic.  To cancel or reschedule your appointment with Pain Management please remember to call 24 hours in advance to avoid a fee.  Refer to the educational materials which you have been given on: General Risks, I had my Procedure. Discharge Instructions, Post Sedation.  Post Procedure Instructions:  The drugs you were given will stay in your system until tomorrow, so for the next 24 hours you should not drive, make any legal decisions or drink any alcoholic beverages.  You may eat anything you prefer, but it is better to start with liquids then soups and crackers, and gradually work up to solid foods.  Please notify your doctor immediately if you have any unusual bleeding, trouble breathing or pain that is not related to your normal pain.  Depending on the type of procedure that was done, some parts of your body may feel week and/or numb.  This usually clears up by tonight or the next day.  Walk with the use of an assistive device or accompanied by an adult for the 24 hours.  You may use ice on the affected area for the first 24 hours.  Put ice in a Ziploc bag and cover with a towel and place against area 15 minutes  on 15 minutes off.  You may switch to heat after 24 hours.

## 2015-03-27 ENCOUNTER — Ambulatory Visit: Payer: Medicare Other | Attending: Pain Medicine | Admitting: Pain Medicine

## 2015-03-27 ENCOUNTER — Encounter (INDEPENDENT_AMBULATORY_CARE_PROVIDER_SITE_OTHER): Payer: Self-pay

## 2015-03-27 ENCOUNTER — Encounter: Payer: Self-pay | Admitting: Pain Medicine

## 2015-03-27 VITALS — BP 181/79 | HR 108 | Temp 98.2°F | Resp 16 | Ht 66.0 in | Wt 164.0 lb

## 2015-03-27 DIAGNOSIS — M4328 Fusion of spine, sacral and sacrococcygeal region: Secondary | ICD-10-CM | POA: Insufficient documentation

## 2015-03-27 DIAGNOSIS — M5137 Other intervertebral disc degeneration, lumbosacral region: Secondary | ICD-10-CM | POA: Diagnosis not present

## 2015-03-27 DIAGNOSIS — M5030 Other cervical disc degeneration, unspecified cervical region: Secondary | ICD-10-CM

## 2015-03-27 DIAGNOSIS — M5481 Occipital neuralgia: Secondary | ICD-10-CM

## 2015-03-27 DIAGNOSIS — M545 Low back pain: Secondary | ICD-10-CM | POA: Diagnosis present

## 2015-03-27 DIAGNOSIS — M533 Sacrococcygeal disorders, not elsewhere classified: Secondary | ICD-10-CM

## 2015-03-27 MED ORDER — LIDOCAINE HCL (PF) 1 % IJ SOLN
INTRAMUSCULAR | Status: AC
  Administered 2015-03-27: 14:00:00
  Filled 2015-03-27: qty 5

## 2015-03-27 MED ORDER — SODIUM CHLORIDE 0.9 % IJ SOLN
INTRAMUSCULAR | Status: AC
  Administered 2015-03-27: 14:00:00
  Filled 2015-03-27: qty 20

## 2015-03-27 MED ORDER — ORPHENADRINE CITRATE 30 MG/ML IJ SOLN
INTRAMUSCULAR | Status: AC
  Administered 2015-03-27: 14:00:00
  Filled 2015-03-27: qty 2

## 2015-03-27 MED ORDER — BUPIVACAINE HCL (PF) 0.25 % IJ SOLN
INTRAMUSCULAR | Status: AC
  Administered 2015-03-27: 14:00:00
  Filled 2015-03-27: qty 30

## 2015-03-27 MED ORDER — CIPROFLOXACIN IN D5W 400 MG/200ML IV SOLN
INTRAVENOUS | Status: AC
  Administered 2015-03-27: 400 mg via INTRAVENOUS
  Filled 2015-03-27: qty 200

## 2015-03-27 MED ORDER — CIPROFLOXACIN IN D5W 400 MG/200ML IV SOLN: 400.0000 mg | Freq: Once | INTRAVENOUS | Status: DC

## 2015-03-27 MED ORDER — MIDAZOLAM HCL 5 MG/5ML IJ SOLN
INTRAMUSCULAR | Status: AC
  Administered 2015-03-27: 3 mg via INTRAVENOUS
  Filled 2015-03-27: qty 5

## 2015-03-27 MED ORDER — TRIAMCINOLONE ACETONIDE 40 MG/ML IJ SUSP
INTRAMUSCULAR | Status: AC
  Administered 2015-03-27: 40 mg
  Filled 2015-03-27: qty 1

## 2015-03-27 MED ORDER — FENTANYL CITRATE (PF) 100 MCG/2ML IJ SOLN
INTRAMUSCULAR | Status: AC
  Administered 2015-03-27: 100 ug via INTRAVASCULAR
  Filled 2015-03-27: qty 2

## 2015-03-27 NOTE — Procedures (Signed)
PROCEDURE PERFORMED: Lumbar epidural steroid injection   NOTE: The patient is a 49 y.o.-year-old female who returns to [place] for further evaluation and treatment of pain involving the lumbar and lower extremity region. MRI studies have revealed the patient to be with multilevel degenerative changes of the lumbar spine The risks, benefits, and expectations of the procedure have been discussed and explained to the patient who was understanding and in agreement with suggested treatment plan. We will proceed with interventional treatment as discussed and explained to the patient who is willing to proceed with procedure as planned.   DESCRIPTION OF PROCEDURE: Lumbar epidural steroid injection with IV Versed, IV fentanyl conscious sedation, EKG, blood pressure, pulse, and pulse oximetry monitoring. The procedure was performed with the patient in the prone position under fluoroscopic guidance. A local anesthetic skin wheal of 1.5% plain lidocaine was accomplished at proposed entry site. An 18-gauge Tuohy epidural needle was inserted at the L 4 vertebral body level right of the midline via loss-of-resistance technique with negative heme and negative CSF return. A total of 4 mL of Preservative-Free normal saline with 40 mg of Kenalog injected incrementally via epidurally placed needle. Needle removed. The patient tolerated the injection well.   PLAN:   1. Medications: We will continue presently prescribed medications. 2. Will consider modification of treatment regimen pending response to treatment rendered on today's visit and follow-up evaluation. 3. The patient is to follow-up with primary care physician regarding blood pressure and general medical condition status post lumbar epidural steroid injection performed on today's visit. 4. Surgical evaluation. 5. Neurological evaluation. 6. The patient may be a candidate for radiofrequency procedures, implantation device, and other treatment pending  response to treatment and follow-up evaluation. 7. The patient has been advised to adhere to proper body mechanics and avoid activities which appear to aggravate condition. 8. The patient has been advised to call the [facility] prior to scheduled return appointment should there be significant change in condition or should there be significant  1. Medications: We will continue presently prescribed medications.  2. Will consider modification of treatment regimen pending response to treatment rendered on today's visit and follow-up evaluation.  3. The patient is to follow-up with primary care physician regarding blood pressure and general medical condition status post lumbar epidural steroid injection performed on today's visit.  4. Surgical evaluation.  5. Neurological evaluation. 6. The patient may be a candidate for radiofrequency procedures, implantation device, and other treatment pending response to treatment and follow-up evaluation.  7. The patient has been advised to adhere to proper body mechanics and avoid activities which appear to aggravate condition.  8. The patient has been advised to call the [facility] prior to scheduled return appointment should there be significant change in condition or should should patient have other concerns regarding condition prior to scheduled return appointment.  The patient is understanding and in agreement with suggested treatment plan.

## 2015-03-27 NOTE — Progress Notes (Signed)
Patient discharge via wheelchair to home accompanied by husband at  1433 Hours. Teach back 3 done

## 2015-03-27 NOTE — Progress Notes (Signed)
Patient is a 49 year old female returns to [facility] for evaluation of lower back and lower extremity pain. Pain is severe increases with standing and walking and radiates to both the left and right lower extremities patient with significant spasms lumbar region thoracic region and the spasm of the cervical region with headaches. We'll proceed with lumbar epidural steroid injection in attempt to decrease severity of symptoms minimize risk of medication escalation and hopefully avoid progression of patient's symptoms. Patient is understanding and in agreement with suggested treatment plan.

## 2015-03-28 ENCOUNTER — Telehealth: Payer: Self-pay | Admitting: *Deleted

## 2015-03-28 NOTE — Telephone Encounter (Signed)
Left message

## 2015-04-12 ENCOUNTER — Other Ambulatory Visit: Payer: Self-pay | Admitting: Pain Medicine

## 2015-04-17 ENCOUNTER — Other Ambulatory Visit: Payer: Self-pay | Admitting: Pain Medicine

## 2015-04-17 DIAGNOSIS — M533 Sacrococcygeal disorders, not elsewhere classified: Secondary | ICD-10-CM

## 2015-04-17 DIAGNOSIS — M5481 Occipital neuralgia: Secondary | ICD-10-CM

## 2015-04-17 DIAGNOSIS — M5030 Other cervical disc degeneration, unspecified cervical region: Secondary | ICD-10-CM

## 2015-04-17 DIAGNOSIS — M5137 Other intervertebral disc degeneration, lumbosacral region: Secondary | ICD-10-CM

## 2015-04-20 ENCOUNTER — Encounter: Payer: Self-pay | Admitting: Pain Medicine

## 2015-04-20 ENCOUNTER — Ambulatory Visit: Payer: Medicare Other | Attending: Pain Medicine | Admitting: Pain Medicine

## 2015-04-20 VITALS — BP 154/49 | HR 97 | Temp 98.0°F | Resp 16 | Ht 66.0 in | Wt 162.0 lb

## 2015-04-20 DIAGNOSIS — I7092 Chronic total occlusion of artery of the extremities: Secondary | ICD-10-CM

## 2015-04-20 DIAGNOSIS — M546 Pain in thoracic spine: Secondary | ICD-10-CM | POA: Diagnosis present

## 2015-04-20 DIAGNOSIS — M5481 Occipital neuralgia: Secondary | ICD-10-CM | POA: Diagnosis not present

## 2015-04-20 DIAGNOSIS — M433 Recurrent atlantoaxial dislocation with myelopathy: Secondary | ICD-10-CM | POA: Insufficient documentation

## 2015-04-20 DIAGNOSIS — M5137 Other intervertebral disc degeneration, lumbosacral region: Secondary | ICD-10-CM

## 2015-04-20 DIAGNOSIS — M461 Sacroiliitis, not elsewhere classified: Secondary | ICD-10-CM | POA: Insufficient documentation

## 2015-04-20 DIAGNOSIS — M5136 Other intervertebral disc degeneration, lumbar region: Secondary | ICD-10-CM | POA: Diagnosis not present

## 2015-04-20 DIAGNOSIS — I739 Peripheral vascular disease, unspecified: Secondary | ICD-10-CM

## 2015-04-20 DIAGNOSIS — I7389 Other specified peripheral vascular diseases: Secondary | ICD-10-CM | POA: Diagnosis not present

## 2015-04-20 DIAGNOSIS — M533 Sacrococcygeal disorders, not elsewhere classified: Secondary | ICD-10-CM

## 2015-04-20 DIAGNOSIS — M25559 Pain in unspecified hip: Secondary | ICD-10-CM

## 2015-04-20 DIAGNOSIS — M542 Cervicalgia: Secondary | ICD-10-CM | POA: Diagnosis present

## 2015-04-20 DIAGNOSIS — M5030 Other cervical disc degeneration, unspecified cervical region: Secondary | ICD-10-CM

## 2015-04-20 MED ORDER — PREGABALIN 100 MG PO CAPS: 100.0000 mg | ORAL_CAPSULE | Freq: Three times a day (TID) | ORAL | Status: DC

## 2015-04-20 MED ORDER — HYDROCODONE-ACETAMINOPHEN 10-325 MG PO TABS: 1.0000 | ORAL_TABLET | Freq: Two times a day (BID) | ORAL | Status: DC | PRN

## 2015-04-20 NOTE — Progress Notes (Signed)
   Subjective:    Patient ID: [name], female    DOB: [birth year], 49 y.o.   MRN: [id number]  HPI  Patient is 49 year old female returns to [place] for further evaluation and treatment of pain involving the neck and entire back upper and lower extremity regions. Patient states most bothersome pain involves the lower back and lower extremity region at this time. Patient is undergone vascular evaluation and treatment and is with continued pain. There is concern regarding intraspinal abnormalities contributing to patient's symptomatology to significant degree. We will consider patient for interventional treatment at time return appointment as discussed and will consider additional modification of treatment pending follow-up evaluation. The patient was understanding and agreed stenosis with suggested treatment plan.     Review of Systems     Objective:   Physical Exam  There was tennis to palpation splenius capitis and occipitalis musculature region of moderate degree with moderate tenderness over the region of the cervical facet cervical paraspinal musculature region. Palpation of the acromial clavicular glenohumeral joint region without increased pain of any significant degree. Patient appeared to be with bilaterally equal grip strength. Tinel and Phalen's maneuver were without increased pain of any significant degree. Palpation over the lumbar paraspinal musculature and lumbar facet region associated with moderate marked severe discomfort with lateral bending and rotation reproducing moderate discomfort palpation of the PSIS and PII S region reproduced moderate discomfort there was mild tinnitus of the greater trochanteric region on the left as well as on the right.. Leg raising tolerates approximately 20 without a definite definite increase of pain with dorsiflexion noted. There was negative clonus negative Homans. Tendon no costovertebral tenderness noted.          Assessment & Plan:    Degenerative disc disease lumbar spine Lumbar facet syndrome  Peripherovascular disease of the lower extremities  Sacroiliitis sacroiliac joint dysfunction  Greater occipital neuralgia    Plan   Continue present medications.  Lumbar epidural steroid injection to be performed at time return appointment pending medical clearance and pending approval to interrupt aspirin therapy  F/U PCP for evaliation of  BP and general medical  condition.  F/U vascular evaluation  F/U surgical evaluation.  F/U neurological evaluation.  May consider radiofrequency rhizolysis or intraspinal procedures pending response to present treatment and F/U evaluation.  Patient to call [facility] Management Center should patient have concerns prior to scheduled return appointment.

## 2015-04-20 NOTE — Patient Instructions (Addendum)
Smoking Cessation Quitting smoking is important to your health and has many advantages. However, it is not always easy to quit since nicotine is a very addictive drug. Oftentimes, people try 3 times or more before being able to quit. This document explains the best ways for you to prepare to quit smoking. Quitting takes hard work and a lot of effort, but you can do it. ADVANTAGES OF QUITTING SMOKING 1. You will live longer, feel better, and live better. 2. Your body will feel the impact of quitting smoking almost immediately. 1. Within 20 minutes, blood pressure decreases. Your pulse returns to its normal level. 2. After 8 hours, carbon monoxide levels in the blood return to normal. Your oxygen level increases. 3. After 24 hours, the chance of having a heart attack starts to decrease. Your breath, hair, and body stop smelling like smoke. 4. After 48 hours, damaged nerve endings begin to recover. Your sense of taste and smell improve. 5. After 72 hours, the body is virtually free of nicotine. Your bronchial tubes relax and breathing becomes easier. 6. After 2 to 12 weeks, lungs can hold more air. Exercise becomes easier and circulation improves. 3. The risk of having a heart attack, stroke, cancer, or lung disease is greatly reduced. 1. After 1 year, the risk of coronary heart disease is cut in half. 2. After 5 years, the risk of stroke falls to the same as a nonsmoker. 3. After 10 years, the risk of lung cancer is cut in half and the risk of other cancers decreases significantly. 4. After 15 years, the risk of coronary heart disease drops, usually to the level of a nonsmoker. 4. If you are pregnant, quitting smoking will improve your chances of having a healthy baby. 5. The people you live with, especially any children, will be healthier. 6. You will have extra money to spend on things other than cigarettes. QUESTIONS TO THINK ABOUT BEFORE ATTEMPTING TO QUIT You may want to talk about your  answers with your health care provider. 1. Why do you want to quit? 2. If you tried to quit in the past, what helped and what did not? 3. What will be the most difficult situations for you after you quit? How will you plan to handle them? 4. Who can help you through the tough times? Your family? Friends? A health care provider? 5. What pleasures do you get from smoking? What ways can you still get pleasure if you quit? Here are some questions to ask your health care provider: 1. How can you help me to be successful at quitting? 2. What medicine do you think would be best for me and how should I take it? 3. What should I do if I need more help? 4. What is smoking withdrawal like? How can I get information on withdrawal? GET READY 1. Set a quit date. 2. Change your environment by getting rid of all cigarettes, ashtrays, matches, and lighters in your home, car, or work. Do not let people smoke in your home. 3. Review your past attempts to quit. Think about what worked and what did not. GET SUPPORT AND ENCOURAGEMENT You have a better chance of being successful if you have help. You can get support in many ways. 1. Tell your family, friends, and coworkers that you are going to quit and need their support. Ask them not to smoke around you. 2. Get individual, group, or telephone counseling and support. Programs are available at [employer] and health centers. Call  your local health department for information about programs in your area. 3. Spiritual beliefs and practices may help some smokers quit. 4. Download a "quit meter" on your computer to keep track of quit statistics, such as how long you have gone without smoking, cigarettes not smoked, and money saved. 5. Get a self-help book about quitting smoking and staying off tobacco. [place] yourself from urges to smoke. Talk to someone, go for a walk, or occupy your time with a task.  Change your normal routine.  Take a different route to work. Drink tea instead of coffee. Eat breakfast in a different place.  Reduce your stress. Take a hot bath, exercise, or read a book.  Plan something enjoyable to do every day. Reward yourself for not smoking.  Explore interactive web-based programs that specialize in helping you quit. GET MEDICINE AND USE IT CORRECTLY Medicines can help you stop smoking and decrease the urge to smoke. Combining medicine with the above behavioral methods and support can greatly increase your chances of successfully quitting smoking.  Nicotine replacement therapy helps deliver nicotine to your body without the negative effects and risks of smoking. Nicotine replacement therapy includes nicotine gum, lozenges, inhalers, nasal sprays, and skin patches. Some may be available over-the-counter and others require a prescription.  Antidepressant medicine helps people abstain from smoking, but how this works is unknown. This medicine is available by prescription.  Nicotinic receptor partial agonist medicine simulates the effect of nicotine in your brain. This medicine is available by prescription. Ask your health care provider for advice about which medicines to use and how to use them based on your health history. Your health care provider will tell you what side effects to look out for if you choose to be on a medicine or therapy. Carefully read the information on the package. Do not use any other product containing nicotine while using a nicotine replacement product.  RELAPSE OR DIFFICULT SITUATIONS Most relapses occur within the first 3 months after quitting. Do not be discouraged if you start smoking again. Remember, most people try several times before finally quitting. You may have symptoms of withdrawal because your body is used to nicotine. You may crave cigarettes, be irritable, feel very hungry, cough often, get headaches, or have difficulty concentrating. The withdrawal symptoms are  only temporary. They are strongest when you first quit, but they will go away within 10-14 days. To reduce the chances of relapse, try to:  Avoid drinking alcohol. Drinking lowers your chances of successfully quitting.  Reduce the amount of caffeine you consume. Once you quit smoking, the amount of caffeine in your body increases and can give you symptoms, such as a rapid heartbeat, sweating, and anxiety.  Avoid smokers because they can make you want to smoke.  Do not let weight gain distract you. Many smokers will gain weight when they quit, usually less than 10 pounds. Eat a healthy diet and stay active. You can always lose the weight gained after you quit.  Find ways to improve your mood other than smoking. FOR MORE INFORMATION  www.smokefree.gov  Document Released: 10/29/2001 Document Revised: 03/21/2014 Document Reviewed: 02/13/2012 [facility] Patient Information 2015 [place], [place]. This information is not intended to replace advice given to you by your health care provider. Make sure you discuss any questions you have with your health care provider.   Continue present medications.  Lumbar epidural steroid injection 05/03/2015  F/U PCP for evaliation of  BP and general medical  condition.  F/U surgical evaluation.  F/U neurological evaluation.  May consider radiofrequency rhizolysis or intraspinal procedures pending response to present treatment and F/U evaluation.  Patient to call [facility] should patient have concerns prior to scheduled return appointment. Pain Management Discharge Instructions  General Discharge Instructions :  If you need to reach your doctor call: Monday-Friday 8:00 am - 4:00 pm at [phone] or toll free [phone].  After clinic hours [phone] to have operator reach doctor.  Bring all of your medication bottles to all your appointments in the pain clinic.  To cancel or reschedule your appointment with Pain Management please  remember to call 24 hours in advance to avoid a fee.  Refer to the educational materials which you have been given on: General Risks, I had my Procedure. Discharge Instructions, Post Sedation.  Post Procedure Instructions:  The drugs you were given will stay in your system until tomorrow, so for the next 24 hours you should not drive, make any legal decisions or drink any alcoholic beverages.  You may eat anything you prefer, but it is better to start with liquids then soups and crackers, and gradually work up to solid foods.  Please notify your doctor immediately if you have any unusual bleeding, trouble breathing or pain that is not related to your normal pain.  Depending on the type of procedure that was done, some parts of your body may feel week and/or numb.  This usually clears up by tonight or the next day.  Walk with the use of an assistive device or accompanied by an adult for the 24 hours.  You may use ice on the affected area for the first 24 hours.  Put ice in a Ziploc bag and cover with a towel and place against area 15 minutes on 15 minutes off.  You may switch to heat after 24 hours.GENERAL RISKS AND COMPLICATIONS  What are the risk, side effects and possible complications? Generally speaking, most procedures are safe.  However, with any procedure there are risks, side effects, and the possibility of complications.  The risks and complications are dependent upon the sites that are lesioned, or the type of nerve block to be performed.  The closer the procedure is to the spine, the more serious the risks are.  Great care is taken when placing the radio frequency needles, block needles or lesioning probes, but sometimes complications can occur. 1. Infection: Any time there is an injection through the skin, there is a risk of infection.  This is why sterile conditions are used for these blocks.  There are four possible types of infection. 1. Localized skin infection. 2. Central  Nervous System Infection-This can be in the form of Meningitis, which can be deadly. 3. Epidural Infections-This can be in the form of an epidural abscess, which can cause pressure inside of the spine, causing compression of the spinal cord with subsequent paralysis. This would require an emergency surgery to decompress, and there are no guarantees that the patient would recover from the paralysis. 4. Discitis-This is an infection of the intervertebral discs.  It occurs in about 1% of discography procedures.  It is difficult to treat and it may lead to surgery.        2. Pain: the needles have to go through skin and soft tissues, will cause soreness.       3. Damage to internal structures:  The nerves to be lesioned may be near blood vessels or    other nerves  which can be potentially damaged.       4. Bleeding: Bleeding is more common if the patient is taking blood thinners such as  aspirin, Coumadin, Ticiid, Plavix, etc., or if he/she have some genetic predisposition  such as hemophilia. Bleeding into the spinal canal can cause compression of the spinal  cord with subsequent paralysis.  This would require an emergency surgery to  decompress and there are no guarantees that the patient would recover from the  paralysis.       5. Pneumothorax:  Puncturing of a lung is a possibility, every time a needle is introduced in  the area of the chest or upper back.  Pneumothorax refers to free air around the  collapsed lung(s), inside of the thoracic cavity (chest cavity).  Another two possible  complications related to a similar event would include: Hemothorax and Chylothorax.   These are variations of the Pneumothorax, where instead of air around the collapsed  lung(s), you may have blood or chyle, respectively.       6. Spinal headaches: They may occur with any procedures in the area of the spine.       7. Persistent CSF (Cerebro-Spinal Fluid) leakage: This is a rare problem, but may occur  with prolonged  intrathecal or epidural catheters either due to the formation of a fistulous  track or a dural tear.       8. Nerve damage: By working so close to the spinal cord, there is always a possibility of  nerve damage, which could be as serious as a permanent spinal cord injury with  paralysis.       9. Death:  Although rare, severe deadly allergic reactions known as "Anaphylactic  reaction" can occur to any of the medications used.      10. Worsening of the symptoms:  We can always make thing worse.  What are the chances of something like this happening? Chances of any of this occuring are extremely low.  By statistics, you have more of a chance of getting killed in a motor vehicle accident: while driving to the hospital than any of the above occurring .  Nevertheless, you should be aware that they are possibilities.  In general, it is similar to taking a shower.  Everybody knows that you can slip, hit your head and get killed.  Does that mean that you should not shower again?  Nevertheless always keep in mind that statistics do not mean anything if you happen to be on the wrong side of them.  Even if a procedure has a 1 (one) in a 1,000,000 (million) chance of going wrong, it you happen to be that one..Also, keep in mind that by statistics, you have more of a chance of having something go wrong when taking medications.  Who should not have this procedure? If you are on a blood thinning medication (e.g. Coumadin, Plavix, see list of "Blood Thinners"), or if you have an active infection going on, you should not have the procedure.  If you are taking any blood thinners, please inform your physician.  How should I prepare for this procedure?  Do not eat or drink anything at least six hours prior to the procedure.  Bring a driver with you .  It cannot be a taxi.  Come accompanied by an adult that can drive you back, and that is strong enough to help you if your legs get weak or numb from the local  anesthetic.  Take all of  your medicines the morning of the procedure with just enough water to swallow them.  If you have diabetes, make sure that you are scheduled to have your procedure done first thing in the morning, whenever possible.  If you have diabetes, take only half of your insulin dose and notify our nurse that you have done so as soon as you arrive at the clinic.  If you are diabetic, but only take blood sugar pills (oral hypoglycemic), then do not take them on the morning of your procedure.  You may take them after you have had the procedure.  Do not take aspirin or any aspirin-containing medications, at least eleven (11) days prior to the procedure.  They may prolong bleeding.  Wear loose fitting clothing that may be easy to take off and that you would not mind if it got stained with Betadine or blood.  Do not wear any jewelry or perfume  Remove any nail coloring.  It will interfere with some of our monitoring equipment.  NOTE: Remember that this is not meant to be interpreted as a complete list of all possible complications.  Unforeseen problems may occur.  BLOOD THINNERS The following drugs contain aspirin or other products, which can cause increased bleeding during surgery and should not be taken for 2 weeks prior to and 1 week after surgery.  If you should need take something for relief of minor pain, you may take acetaminophen which is found in Tylenol,m Datril, Anacin-3 and Panadol. It is not blood thinner. The products listed below are.  Do not take any of the products listed below in addition to any listed on your instruction sheet.  A.P.C or A.P.C with Codeine Codeine Phosphate Capsules #3 Ibuprofen Ridaura  ABC compound Congesprin Imuran rimadil  Advil Cope Indocin Robaxisal  Alka-Seltzer Effervescent Pain Reliever and Antacid Coricidin or Coricidin-D  Indomethacin Rufen  Alka-Seltzer plus Cold Medicine Cosprin Ketoprofen S-A-C Tablets  Anacin Analgesic Tablets  or Capsules Coumadin Korlgesic Salflex  Anacin Extra Strength Analgesic tablets or capsules CP-2 Tablets Lanoril Salicylate  Anaprox Cuprimine Capsules Levenox Salocol  Anexsia-D Dalteparin Magan Salsalate  Anodynos Darvon compound Magnesium Salicylate Sine-off  Ansaid Dasin Capsules Magsal Sodium Salicylate  Anturane Depen Capsules Marnal Soma  APF Arthritis pain formula Dewitt's Pills Measurin Stanback  Argesic Dia-Gesic Meclofenamic Sulfinpyrazone  Arthritis Bayer Timed Release Aspirin Diclofenac Meclomen Sulindac  Arthritis pain formula Anacin Dicumarol Medipren Supac  Analgesic (Safety coated) Arthralgen Diffunasal Mefanamic Suprofen  Arthritis Strength Bufferin Dihydrocodeine Mepro Compound Suprol  Arthropan liquid Dopirydamole Methcarbomol with Aspirin Synalgos  ASA tablets/Enseals Disalcid Micrainin Tagament  Ascriptin Doan's Midol Talwin  Ascriptin A/D Dolene Mobidin Tanderil  Ascriptin Extra Strength Dolobid Moblgesic Ticlid  Ascriptin with Codeine Doloprin or Doloprin with Codeine Momentum Tolectin  Asperbuf Duoprin Mono-gesic Trendar  Aspergum Duradyne Motrin or Motrin IB Triminicin  Aspirin plain, buffered or enteric coated Durasal Myochrisine Trigesic  Aspirin Suppositories Easprin Nalfon Trillsate  Aspirin with Codeine Ecotrin Regular or Extra Strength Naprosyn Uracel  Atromid-S Efficin Naproxen Ursinus  Auranofin Capsules Elmiron Neocylate Vanquish  Axotal Emagrin Norgesic Verin  Azathioprine Empirin or Empirin with Codeine Normiflo Vitamin E  Azolid Emprazil Nuprin Voltaren  Bayer Aspirin plain, buffered or children's or timed BC Tablets or powders Encaprin Orgaran Warfarin Sodium  Buff-a-Comp Enoxaparin Orudis Zorpin  Buff-a-Comp with Codeine Equegesic Os-Cal-Gesic   Buffaprin Excedrin plain, buffered or Extra Strength Oxalid   Bufferin Arthritis Strength Feldene Oxphenbutazone   Bufferin plain or Extra Strength Feldene Capsules Oxycodone with Aspirin  Bufferin  with Codeine Fenoprofen Fenoprofen Pabalate or Pabalate-SF   Buffets II Flogesic Panagesic   Buffinol plain or Extra Strength Florinal or Florinal with Codeine Panwarfarin   Buf-Tabs Flurbiprofen Penicillamine   Butalbital Compound Four-way cold tablets Penicillin   Butazolidin Fragmin Pepto-Bismol   Carbenicillin Geminisyn Percodan   Carna Arthritis Reliever Geopen Persantine   Carprofen Gold's salt Persistin   Chloramphenicol Goody's Phenylbutazone   Chloromycetin Haltrain Piroxlcam   Clmetidine heparin Plaquenil   Cllnoril Hyco-pap Ponstel   Clofibrate Hydroxy chloroquine Propoxyphen         Before stopping any of these medications, be sure to consult the physician who ordered them.  Some, such as Coumadin (Warfarin) are ordered to prevent or treat serious conditions such as "deep thrombosis", "pumonary embolisms", and other heart problems.  The amount of time that you may need off of the medication may also vary with the medication and the reason for which you were taking it.  If you are taking any of these medications, please make sure you notify your pain physician before you undergo any procedures.         Epidural Steroid Injection Patient Information  Description: The epidural space surrounds the nerves as they exit the spinal cord.  In some patients, the nerves can be compressed and inflamed by a bulging disc or a tight spinal canal (spinal stenosis).  By injecting steroids into the epidural space, we can bring irritated nerves into direct contact with a potentially helpful medication.  These steroids act directly on the irritated nerves and can reduce swelling and inflammation which often leads to decreased pain.  Epidural steroids may be injected anywhere along the spine and from the neck to the low back depending upon the location of your pain.   After numbing the skin with local anesthetic (like Novocaine), a small needle is passed into the epidural space slowly.  You may  experience a sensation of pressure while this is being done.  The entire block usually last less than 10 minutes.  Conditions which may be treated by epidural steroids:   Low back and leg pain  Neck and arm pain  Spinal stenosis  Post-laminectomy syndrome  Herpes zoster (shingles) pain  Pain from compression fractures  Preparation for the injection:  6. Do not eat any solid food or dairy products within 6 hours of your appointment.  7. You may drink clear liquids up to 2 hours before appointment.  Clear liquids include water, black coffee, juice or soda.  No milk or cream please. 8. You may take your regular medication, including pain medications, with a sip of water before your appointment  Diabetics should hold regular insulin (if taken separately) and take 1/2 normal NPH dos the morning of the procedure.  Carry some sugar containing items with you to your appointment. 9. A driver must accompany you and be prepared to drive you home after your procedure.  10. Bring all your current medications with your. 11. An IV may be inserted and sedation may be given at the discretion of the physician.   12. A blood pressure cuff, EKG and other monitors will often be applied during the procedure.  Some patients may need to have extra oxygen administered for a short period. 51. You will be asked to provide medical information, including your allergies, prior to the procedure.  We must know immediately if you are taking blood thinners (like Coumadin/Warfarin)  Or if you are allergic to IV iodine contrast (dye). We  must know if you could possible be pregnant.  Possible side-effects:  Bleeding from needle site  Infection (rare, may require surgery)  Nerve injury (rare)  Numbness & tingling (temporary)  Difficulty urinating (rare, temporary)  Spinal headache ( a headache worse with upright posture)  Light -headedness (temporary)  Pain at injection site (several days)  Decreased blood  pressure (temporary)  Weakness in arm/leg (temporary)  Pressure sensation in back/neck (temporary)  Call if you experience:  Fever/chills associated with headache or increased back/neck pain.  Headache worsened by an upright position.  New onset weakness or numbness of an extremity below the injection site  Hives or difficulty breathing (go to the emergency room)  Inflammation or drainage at the infection site  Severe back/neck pain  Any new symptoms which are concerning to you  Please note:  Although the local anesthetic injected can often make your back or neck feel good for several hours after the injection, the pain will likely return.  It takes 3-7 days for steroids to work in the epidural space.  You may not notice any pain relief for at least that one week.  If effective, we will often do a series of three injections spaced 3-6 weeks apart to maximally decrease your pain.  After the initial series, we generally will wait several months before considering a repeat injection of the same type.  If you have any questions, please call [phone] [facility]

## 2015-04-20 NOTE — Progress Notes (Signed)
teachaback 3 done. Pre procedure instructions given. Scripts given as ordered.

## 2015-04-20 NOTE — Progress Notes (Signed)
Safety precautions to be maintained throughout the outpatient stay will include: orient to surroundings, keep bed in low position, maintain call bell within reach at all times, provide assistance with transfer out of bed and ambulation.  

## 2015-05-17 ENCOUNTER — Ambulatory Visit: Payer: Medicare Other | Attending: Pain Medicine | Admitting: Pain Medicine

## 2015-05-17 ENCOUNTER — Encounter: Payer: Self-pay | Admitting: Pain Medicine

## 2015-05-17 VITALS — BP 95/64 | HR 96 | Temp 98.4°F | Resp 16 | Ht 66.0 in | Wt 162.0 lb

## 2015-05-17 DIAGNOSIS — M4806 Spinal stenosis, lumbar region: Secondary | ICD-10-CM | POA: Insufficient documentation

## 2015-05-17 DIAGNOSIS — M545 Low back pain: Secondary | ICD-10-CM | POA: Diagnosis present

## 2015-05-17 DIAGNOSIS — M79605 Pain in left leg: Secondary | ICD-10-CM | POA: Diagnosis present

## 2015-05-17 DIAGNOSIS — M5137 Other intervertebral disc degeneration, lumbosacral region: Secondary | ICD-10-CM

## 2015-05-17 DIAGNOSIS — M47816 Spondylosis without myelopathy or radiculopathy, lumbar region: Secondary | ICD-10-CM | POA: Insufficient documentation

## 2015-05-17 DIAGNOSIS — M5481 Occipital neuralgia: Secondary | ICD-10-CM

## 2015-05-17 DIAGNOSIS — M79604 Pain in right leg: Secondary | ICD-10-CM | POA: Diagnosis present

## 2015-05-17 DIAGNOSIS — M5126 Other intervertebral disc displacement, lumbar region: Secondary | ICD-10-CM | POA: Insufficient documentation

## 2015-05-17 DIAGNOSIS — M533 Sacrococcygeal disorders, not elsewhere classified: Secondary | ICD-10-CM

## 2015-05-17 DIAGNOSIS — M5030 Other cervical disc degeneration, unspecified cervical region: Secondary | ICD-10-CM

## 2015-05-17 MED ORDER — SODIUM CHLORIDE 0.9 % IJ SOLN
INTRAMUSCULAR | Status: AC
  Administered 2015-05-17: 20 mL
  Filled 2015-05-17: qty 20

## 2015-05-17 MED ORDER — BUPIVACAINE HCL (PF) 0.25 % IJ SOLN
INTRAMUSCULAR | Status: AC
  Filled 2015-05-17: qty 30

## 2015-05-17 MED ORDER — HYDROCODONE-ACETAMINOPHEN 10-325 MG PO TABS: 1.0000 | ORAL_TABLET | Freq: Two times a day (BID) | ORAL | Status: DC | PRN

## 2015-05-17 MED ORDER — ORPHENADRINE CITRATE 30 MG/ML IJ SOLN
INTRAMUSCULAR | Status: AC
  Filled 2015-05-17: qty 2

## 2015-05-17 MED ORDER — CIPROFLOXACIN HCL 250 MG PO TABS: 250.0000 mg | ORAL_TABLET | Freq: Two times a day (BID) | ORAL | Status: DC

## 2015-05-17 MED ORDER — MIDAZOLAM HCL 5 MG/5ML IJ SOLN
INTRAMUSCULAR | Status: AC
  Administered 2015-05-17: 4 mg via INTRAVENOUS
  Filled 2015-05-17: qty 5

## 2015-05-17 MED ORDER — LIDOCAINE HCL (PF) 1 % IJ SOLN
INTRAMUSCULAR | Status: AC
  Administered 2015-05-17: 3 mL
  Filled 2015-05-17: qty 5

## 2015-05-17 MED ORDER — TRIAMCINOLONE ACETONIDE 40 MG/ML IJ SUSP
INTRAMUSCULAR | Status: AC
  Administered 2015-05-17: 40 mg
  Filled 2015-05-17: qty 1

## 2015-05-17 MED ORDER — FENTANYL CITRATE (PF) 100 MCG/2ML IJ SOLN
INTRAMUSCULAR | Status: AC
  Administered 2015-05-17: 100 ug via INTRAVENOUS
  Filled 2015-05-17: qty 2

## 2015-05-17 MED ORDER — PREGABALIN 100 MG PO CAPS: 100.0000 mg | ORAL_CAPSULE | Freq: Three times a day (TID) | ORAL | Status: DC

## 2015-05-17 NOTE — Progress Notes (Signed)
   Subjective:    Patient ID: [name], female    DOB: 07-18-1966, 49 y.o.   MRN: [id number]  HPI  PROCEDURE PERFORMED: Lumbar epidural steroid injection   NOTE: The patient is a 49 y.o. female who returns to [facility] for further evaluation and treatment of pain involving the lumbar and lower extremity region. MRI revealed the patient to be with decompression and fusion at L4-5 and L5-S1 level, L3-4 central stenosis with facet degenerative changes noted as well as disc bulging with irritation of the right L3 nerve roots bilaterally. The risks, benefits, and expectations of the procedure have been discussed and explained to the patient who was understanding and in agreement with suggested treatment plan. We will proceed with interventional treatment as discussed and explained to the patient who is willing to proceed with procedure as planned.   DESCRIPTION OF PROCEDURE: Lumbar epidural steroid injection with IV Versed, IV fentanyl conscious sedation, EKG, blood pressure, pulse, and pulse oximetry monitoring. The procedure was performed with the patient in the prone position under fluoroscopic guidance. A local anesthetic skin wheal of 1.5% plain lidocaine was accomplished at proposed entry site. An 18-gauge Tuohy epidural needle was inserted at the L 3 vertebral body level right of the midline via loss-of-resistance technique with negative heme and negative CSF return. A total of 4 mL of Preservative-Free normal saline with 40 mg of Kenalog injected incrementally via epidurally placed needle. Needle removed.  The patient tolerated the injection well.   PLAN:   1. Medications: We will continue presently prescribed medications. Lyrica and hydrocodone acetaminophen 2. Will consider modification of treatment regimen pending response to treatment rendered on today's visit and follow-up evaluation. 3. The patient is to follow-up with primary care physician regarding blood pressure and  general medical condition status post lumbar epidural steroid injection performed on today's visit. 4. Surgical evaluation Neurosurgical reevaluation as discussed 5. Neurological evaluation. 6. The patient may be a candidate for radiofrequency procedures, implantation device, and other treatment pending response to treatment and follow-up evaluation. 7. The patient has been advised to adhere to proper body mechanics and avoid activities which appear to aggravate condition. 8. The patient has been advised to call the Pain Management Center prior to scheduled return appointment should there be significant change in condition or should there be sign  The patient is understanding and agrees with the suggested  treatment plan   Review of Systems     Objective:   Physical Exam        Assessment & Plan:

## 2015-05-17 NOTE — Progress Notes (Signed)
Safety precautions to be maintained throughout the outpatient stay will include: orient to surroundings, keep bed in low position, maintain call bell within reach at all times, provide assistance with transfer out of bed and ambulation.  

## 2015-05-17 NOTE — Patient Instructions (Addendum)
Continue present medications and antibiotics. Please obtain your antibiotic today and begin taking antibiotic today  F/U PCP for evaliation of  BP and general medical  condition.  F/U surgical evaluation as discussed  F/U neurological evaluation.  May consider radiofrequency rhizolysis or intraspinal procedures pending response to present treatment and F/U evaluation.  Patient to call Pain Management Center should patient have concerns prior to scheduled return appointment.    Pain Management Discharge Instructions  General Discharge Instructions :  If you need to reach your doctor call: Monday-Friday 8:00 am - 4:00 pm at [phone] or toll free [phone].  After clinic hours [phone] to have operator reach doctor.  Bring all of your medication bottles to all your appointments in the pain clinic.  To cancel or reschedule your appointment with Pain Management please remember to call 24 hours in advance to avoid a fee.  Refer to the educational materials which you have been given on: General Risks, I had my Procedure. Discharge Instructions, Post Sedation.  Post Procedure Instructions:  The drugs you were given will stay in your system until tomorrow, so for the next 24 hours you should not drive, make any legal decisions or drink any alcoholic beverages.  You may eat anything you prefer, but it is better to start with liquids then soups and crackers, and gradually work up to solid foods.  Please notify your doctor immediately if you have any unusual bleeding, trouble breathing or pain that is not related to your normal pain.  Depending on the type of procedure that was done, some parts of your body may feel week and/or numb.  This usually clears up by tonight or the next day.  Walk with the use of an assistive device or accompanied by an adult for the 24 hours.  You may use ice on the affected area for the first 24 hours.  Put ice in a Ziploc bag and cover with a towel  and place against area 15 minutes on 15 minutes off.  You may switch to heat after 24 hours.  A prescription for CIPRO was sent to your pharmacy and should be available for pickup today. A prescription for HYDROCODONE was given to you today.Pain Management Discharge Instructions  General Discharge Instructions :  If you need to reach your doctor call: Monday-Friday 8:00 am - 4:00 pm at [phone] or toll free [phone].  After clinic hours [phone] to have operator reach doctor.  Bring all of your medication bottles to all your appointments in the pain clinic.  To cancel or reschedule your appointment with Pain Management please remember to call 24 hours in advance to avoid a fee.  Refer to the educational materials which you have been given on: General Risks, I had my Procedure. Discharge Instructions, Post Sedation.  Post Procedure Instructions:  The drugs you were given will stay in your system until tomorrow, so for the next 24 hours you should not drive, make any legal decisions or drink any alcoholic beverages.  You may eat anything you prefer, but it is better to start with liquids then soups and crackers, and gradually work up to solid foods.  Please notify your doctor immediately if you have any unusual bleeding, trouble breathing or pain that is not related to your normal pain.  Depending on the type of procedure that was done, some parts of your body may feel week and/or numb.  This usually clears up by tonight or the next day.  Walk with the use of  an assistive device or accompanied by an adult for the 24 hours.  You may use ice on the affected area for the first 24 hours.  Put ice in a Ziploc bag and cover with a towel and place against area 15 minutes on 15 minutes off.  You may switch to heat after 24 hours.GENERAL RISKS AND COMPLICATIONS  What are the risk, side effects and possible complications? Generally speaking, most procedures are safe.  However, with  any procedure there are risks, side effects, and the possibility of complications.  The risks and complications are dependent upon the sites that are lesioned, or the type of nerve block to be performed.  The closer the procedure is to the spine, the more serious the risks are.  Great care is taken when placing the radio frequency needles, block needles or lesioning probes, but sometimes complications can occur. 1. Infection: Any time there is an injection through the skin, there is a risk of infection.  This is why sterile conditions are used for these blocks.  There are four possible types of infection. 1. Localized skin infection. 2. Central Nervous System Infection-This can be in the form of Meningitis, which can be deadly. 3. Epidural Infections-This can be in the form of an epidural abscess, which can cause pressure inside of the spine, causing compression of the spinal cord with subsequent paralysis. This would require an emergency surgery to decompress, and there are no guarantees that the patient would recover from the paralysis. 4. Discitis-This is an infection of the intervertebral discs.  It occurs in about 1% of discography procedures.  It is difficult to treat and it may lead to surgery.        2. Pain: the needles have to go through skin and soft tissues, will cause soreness.       3. Damage to internal structures:  The nerves to be lesioned may be near blood vessels or    other nerves which can be potentially damaged.       4. Bleeding: Bleeding is more common if the patient is taking blood thinners such as  aspirin, Coumadin, Ticiid, Plavix, etc., or if he/she have some genetic predisposition  such as hemophilia. Bleeding into the spinal canal can cause compression of the spinal  cord with subsequent paralysis.  This would require an emergency surgery to  decompress and there are no guarantees that the patient would recover from the  paralysis.       5. Pneumothorax:  Puncturing of a lung  is a possibility, every time a needle is introduced in  the area of the chest or upper back.  Pneumothorax refers to free air around the  collapsed lung(s), inside of the thoracic cavity (chest cavity).  Another two possible  complications related to a similar event would include: Hemothorax and Chylothorax.   These are variations of the Pneumothorax, where instead of air around the collapsed  lung(s), you may have blood or chyle, respectively.       6. Spinal headaches: They may occur with any procedures in the area of the spine.       7. Persistent CSF (Cerebro-Spinal Fluid) leakage: This is a rare problem, but may occur  with prolonged intrathecal or epidural catheters either due to the formation of a fistulous  track or a dural tear.       8. Nerve damage: By working so close to the spinal cord, there is always a possibility of  nerve damage, which could  be as serious as a permanent spinal cord injury with  paralysis.       9. Death:  Although rare, severe deadly allergic reactions known as "Anaphylactic  reaction" can occur to any of the medications used.      10. Worsening of the symptoms:  We can always make thing worse.  What are the chances of something like this happening? Chances of any of this occuring are extremely low.  By statistics, you have more of a chance of getting killed in a motor vehicle accident: while driving to the hospital than any of the above occurring .  Nevertheless, you should be aware that they are possibilities.  In general, it is similar to taking a shower.  Everybody knows that you can slip, hit your head and get killed.  Does that mean that you should not shower again?  Nevertheless always keep in mind that statistics do not mean anything if you happen to be on the wrong side of them.  Even if a procedure has a 1 (one) in a 1,000,000 (million) chance of going wrong, it you happen to be that one..Also, keep in mind that by statistics, you have more of a chance of having  something go wrong when taking medications.  Who should not have this procedure? If you are on a blood thinning medication (e.g. Coumadin, Plavix, see list of "Blood Thinners"), or if you have an active infection going on, you should not have the procedure.  If you are taking any blood thinners, please inform your physician.  How should I prepare for this procedure?  Do not eat or drink anything at least six hours prior to the procedure.  Bring a driver with you .  It cannot be a taxi.  Come accompanied by an adult that can drive you back, and that is strong enough to help you if your legs get weak or numb from the local anesthetic.  Take all of your medicines the morning of the procedure with just enough water to swallow them.  If you have diabetes, make sure that you are scheduled to have your procedure done first thing in the morning, whenever possible.  If you have diabetes, take only half of your insulin dose and notify our nurse that you have done so as soon as you arrive at the clinic.  If you are diabetic, but only take blood sugar pills (oral hypoglycemic), then do not take them on the morning of your procedure.  You may take them after you have had the procedure.  Do not take aspirin or any aspirin-containing medications, at least eleven (11) days prior to the procedure.  They may prolong bleeding.  Wear loose fitting clothing that may be easy to take off and that you would not mind if it got stained with Betadine or blood.  Do not wear any jewelry or perfume  Remove any nail coloring.  It will interfere with some of our monitoring equipment.  NOTE: Remember that this is not meant to be interpreted as a complete list of all possible complications.  Unforeseen problems may occur.  BLOOD THINNERS The following drugs contain aspirin or other products, which can cause increased bleeding during surgery and should not be taken for 2 weeks prior to and 1 week after surgery.  If you  should need take something for relief of minor pain, you may take acetaminophen which is found in Tylenol,m Datril, Anacin-3 and Panadol. It is not blood thinner. The products listed  below are.  Do not take any of the products listed below in addition to any listed on your instruction sheet.  A.P.C or A.P.C with Codeine Codeine Phosphate Capsules #3 Ibuprofen Ridaura  ABC compound Congesprin Imuran rimadil  Advil Cope Indocin Robaxisal  Alka-Seltzer Effervescent Pain Reliever and Antacid Coricidin or Coricidin-D  Indomethacin Rufen  Alka-Seltzer plus Cold Medicine Cosprin Ketoprofen S-A-C Tablets  Anacin Analgesic Tablets or Capsules Coumadin Korlgesic Salflex  Anacin Extra Strength Analgesic tablets or capsules CP-2 Tablets Lanoril Salicylate  Anaprox Cuprimine Capsules Levenox Salocol  Anexsia-D Dalteparin Magan Salsalate  Anodynos Darvon compound Magnesium Salicylate Sine-off  Ansaid Dasin Capsules Magsal Sodium Salicylate  Anturane Depen Capsules Marnal Soma  APF Arthritis pain formula Dewitt's Pills Measurin Stanback  Argesic Dia-Gesic Meclofenamic Sulfinpyrazone  Arthritis Bayer Timed Release Aspirin Diclofenac Meclomen Sulindac  Arthritis pain formula Anacin Dicumarol Medipren Supac  Analgesic (Safety coated) Arthralgen Diffunasal Mefanamic Suprofen  Arthritis Strength Bufferin Dihydrocodeine Mepro Compound Suprol  Arthropan liquid Dopirydamole Methcarbomol with Aspirin Synalgos  ASA tablets/Enseals Disalcid Micrainin Tagament  Ascriptin Doan's Midol Talwin  Ascriptin A/D Dolene Mobidin Tanderil  Ascriptin Extra Strength Dolobid Moblgesic Ticlid  Ascriptin with Codeine Doloprin or Doloprin with Codeine Momentum Tolectin  Asperbuf Duoprin Mono-gesic Trendar  Aspergum Duradyne Motrin or Motrin IB Triminicin  Aspirin plain, buffered or enteric coated Durasal Myochrisine Trigesic  Aspirin Suppositories Easprin Nalfon Trillsate  Aspirin with Codeine Ecotrin Regular or Extra Strength  Naprosyn Uracel  Atromid-S Efficin Naproxen Ursinus  Auranofin Capsules Elmiron Neocylate Vanquish  Axotal Emagrin Norgesic Verin  Azathioprine Empirin or Empirin with Codeine Normiflo Vitamin E  Azolid Emprazil Nuprin Voltaren  Bayer Aspirin plain, buffered or children's or timed BC Tablets or powders Encaprin Orgaran Warfarin Sodium  Buff-a-Comp Enoxaparin Orudis Zorpin  Buff-a-Comp with Codeine Equegesic Os-Cal-Gesic   Buffaprin Excedrin plain, buffered or Extra Strength Oxalid   Bufferin Arthritis Strength Feldene Oxphenbutazone   Bufferin plain or Extra Strength Feldene Capsules Oxycodone with Aspirin   Bufferin with Codeine Fenoprofen Fenoprofen Pabalate or Pabalate-SF   Buffets II Flogesic Panagesic   Buffinol plain or Extra Strength Florinal or Florinal with Codeine Panwarfarin   Buf-Tabs Flurbiprofen Penicillamine   Butalbital Compound Four-way cold tablets Penicillin   Butazolidin Fragmin Pepto-Bismol   Carbenicillin Geminisyn Percodan   Carna Arthritis Reliever Geopen Persantine   Carprofen Gold's salt Persistin   Chloramphenicol Goody's Phenylbutazone   Chloromycetin Haltrain Piroxlcam   Clmetidine heparin Plaquenil   Cllnoril Hyco-pap Ponstel   Clofibrate Hydroxy chloroquine Propoxyphen         Before stopping any of these medications, be sure to consult the physician who ordered them.  Some, such as Coumadin (Warfarin) are ordered to prevent or treat serious conditions such as "deep thrombosis", "pumonary embolisms", and other heart problems.  The amount of time that you may need off of the medication may also vary with the medication and the reason for which you were taking it.  If you are taking any of these medications, please make sure you notify your pain physician before you undergo any procedures.   Script given for Hydrocodone. Pick up antibiotic at pharmacy.

## 2015-05-18 ENCOUNTER — Telehealth: Payer: Self-pay | Admitting: *Deleted

## 2015-05-18 NOTE — Telephone Encounter (Signed)
Patient denies complications post procedure.l

## 2015-06-15 ENCOUNTER — Ambulatory Visit: Payer: Medicare Other | Attending: Pain Medicine | Admitting: Pain Medicine

## 2015-06-15 VITALS — BP 116/90 | HR 113 | Temp 97.7°F | Resp 16 | Ht 66.0 in | Wt 162.0 lb

## 2015-06-15 DIAGNOSIS — M5481 Occipital neuralgia: Secondary | ICD-10-CM

## 2015-06-15 DIAGNOSIS — R51 Headache: Secondary | ICD-10-CM | POA: Diagnosis present

## 2015-06-15 DIAGNOSIS — M533 Sacrococcygeal disorders, not elsewhere classified: Secondary | ICD-10-CM

## 2015-06-15 DIAGNOSIS — M5137 Other intervertebral disc degeneration, lumbosacral region: Secondary | ICD-10-CM

## 2015-06-15 DIAGNOSIS — M542 Cervicalgia: Secondary | ICD-10-CM | POA: Diagnosis present

## 2015-06-15 DIAGNOSIS — M5136 Other intervertebral disc degeneration, lumbar region: Secondary | ICD-10-CM | POA: Diagnosis not present

## 2015-06-15 DIAGNOSIS — I739 Peripheral vascular disease, unspecified: Secondary | ICD-10-CM | POA: Insufficient documentation

## 2015-06-15 DIAGNOSIS — M5030 Other cervical disc degeneration, unspecified cervical region: Secondary | ICD-10-CM

## 2015-06-15 DIAGNOSIS — M51379 Other intervertebral disc degeneration, lumbosacral region without mention of lumbar back pain or lower extremity pain: Secondary | ICD-10-CM

## 2015-06-15 MED ORDER — HYDROCODONE-ACETAMINOPHEN 10-325 MG PO TABS: ORAL_TABLET | ORAL | Status: DC

## 2015-06-15 NOTE — Progress Notes (Signed)
   Subjective:    Patient ID: [name], female    DOB: [birth year], 49 y.o.   MRN: [id number]  HPI  Patient 49 year old female returns to [facility] for further evaluation and treatment of pain involving the region of the neck as well as headaches as well as pain involving the lower back and lower extremity regions. Patient states that she has back brace which she would like to wear. We discussed the back brace and patient will wear the back brace at limited times. We informed patient that we would like to limit the use of the back brace to avoid any worsening of her condition of the lumbar region. We will continue present medications and we'll avoid interventional treatment at this time we will observe patient's response to the use of back brace and consider modification of treatment regimen pending follow-up evaluation. All understanding and agreement status treatment plan.     Review of Systems     Objective:   Physical Exam  There was tenderness of the splenius capitis and occipitalis region of mild degree. There was mild tenderness of the cervical facet cervical paraspinal muscles as well as the thoracic facet thoracic paraspinal muscles. There appeared to be unremarkable Spurling's maneuver and patient appeared to be with bilaterally equal grip strength. Tinel and Phalen's maneuver were without increased pain of any significant degree. Palpation of the acromioclavicular glenohumeral joint region was a tends to palpation of moderate degree with unremarkable drop test. Palpation over the region of the thoracic facet thoracic paraspinal muscle treatment the lower thoracic region was with moderate discomfort to moderate muscle spasms. Palpation over the lumbar paraspinal muscles lumbar facet region was a tends to palpation of moderate degree. Lateral bending and rotation associated with moderate discomfort. Straight leg raising tolerates approximately 30 no increased pain with  dorsiflexion noted. There was negative clonus negative Homans. There was tenderness over the region of the PSIS and PII S region of mild to moderate degree. No definite sensory deficit of dermatomal disabused was detected. There was negative clonus negative Homans. Abdomen was nontender with no costovertebral maintenance noted.      Assessment & Plan:  Degenerative disc disease lumbar spine Lumbar facet syndrome  Peripherovascular disease of the lower extremities  Sacroiliitis sacroiliac joint dysfunction  Greater occipital neuralgia   Plan   Continue present medications.  F/U PCP Dr. Lennox Grumbles for evaliation of  BP and general medical  condition.  F/U vascular evaluation as discussed  F/U surgical evaluation as discussed  F/U neurological evaluation.  Patient is to wear back brace with limited use as caution to avoid worsening of her condition  May consider radiofrequency rhizolysis or intraspinal procedures pending response to present treatment and F/U evaluation.

## 2015-06-15 NOTE — Patient Instructions (Addendum)
Continue present medication hydrocodone acetaminophen  Greater occipital nerve block Wednesday, 06/28/2015  F/U PCP Dr. Lennox Grumbles for evaliation of  BP and general medical  condition.  F/U surgical evaluation  F/U neurological evaluation  May consider radiofrequency rhizolysis or intraspinal procedures pending response to present treatment and F/U evaluation.  Patient to call Pain Management Center should patient have concerns prior to scheduled return appointment.  You were given a prescription for Hydrocodone today. Occipital Nerve Block Patient Information  Description: The occipital nerves originate in the cervical (neck) spinal cord and travel upward through muscle and tissue to supply sensation to the back of the head and top of the scalp.  In addition, the nerves control some of the muscles of the scalp.  Occipital neuralgia is an irritation of these nerves which can cause headaches, numbness of the scalp, and neck discomfort.     The occipital nerve block will interrupt nerve transmission through these nerves and can relieve pain and spasm.  The block consists of insertion of a small needle under the skin in the back of the head to deposit local anesthetic (numbing medicine) and/or steroids around the nerve.  The entire block usually lasts less than 5 minutes.  Conditions which may be treated by occipital blocks:   Muscular pain and spasm of the scalp  Nerve irritation, back of the head  Headaches  Upper neck pain  Preparation for the injection:  1. Do not eat any solid food or dairy products within 6 hours of your appointment. 2. You may drink clear liquids up to 2 hours before appointment.  Clear liquids include water, black coffee, juice or soda.  No milk or cream please. 3. You may take your regular medication, including pain medications, with a sip of water before you appointment.  Diabetics should hold regular insulin (if taken separately) and take 1/2 normal NPH dose the  morning of the procedure.  Carry some sugar containing items with you to your appointment. 4. A driver must accompany you and be prepared to drive you home after your procedure. 5. Bring all your current medications with you. 6. An IV may be inserted and sedation may be given at the discretion of the physician. 7. A blood pressure cuff, EKG, and other monitors will often be applied during the procedure.  Some patients may need to have extra oxygen administered for a short period. 8. You will be asked to provide medical information, including your allergies and medications, prior to the procedure.  We must know immediately if you are taking blood thinners (like Coumadin/Warfarin) or if you are allergic to IV iodine contrast (dye).  We must know if you could possible be pregnant.  9. Do not wear a high collared shirt or turtleneck.  Tie long hair up in the back if possible.  Possible side-effects:   Bleeding from needle site  Infection (rare, may require surgery)  Nerve injury (rare)  Hair on back of neck can be tinged with iodine scrub (this will wash out)  Light-headedness (temporary)  Pain at injection site (several days)  Decreased blood pressure (rare, temporary)  Seizure (very rare)  Call if you experience:   Hives or difficulty breathing ( go to the emergency room)  Inflammation or drainage at the injection site(s)  Please note:  Although the local anesthetic injected can often make your painful muscles or headache feel good for several hours after the injection, the pain may return.  It takes 3-7 days for steroids to work.  You may not notice any pain relief for at least one week.  If effective, we will often do a series of injections spaced 3-6 weeks apart to maximally decrease your pain.  If you have any questions, please call [phone] [facility]

## 2015-06-15 NOTE — Progress Notes (Signed)
Safety precautions to be maintained throughout the outpatient stay will include: orient to surroundings, keep bed in low position, maintain call bell within reach at all times, provide assistance with transfer out of bed and ambulation.  

## 2015-06-28 ENCOUNTER — Ambulatory Visit: Payer: Medicare Other | Attending: Pain Medicine | Admitting: Pain Medicine

## 2015-06-28 ENCOUNTER — Encounter: Payer: Self-pay | Admitting: Pain Medicine

## 2015-06-28 VITALS — BP 106/71 | HR 94 | Temp 97.8°F | Resp 16 | Ht 66.0 in | Wt 162.0 lb

## 2015-06-28 DIAGNOSIS — R51 Headache: Secondary | ICD-10-CM | POA: Insufficient documentation

## 2015-06-28 DIAGNOSIS — M47812 Spondylosis without myelopathy or radiculopathy, cervical region: Secondary | ICD-10-CM | POA: Insufficient documentation

## 2015-06-28 DIAGNOSIS — M542 Cervicalgia: Secondary | ICD-10-CM | POA: Diagnosis present

## 2015-06-28 DIAGNOSIS — M5030 Other cervical disc degeneration, unspecified cervical region: Secondary | ICD-10-CM

## 2015-06-28 DIAGNOSIS — M5481 Occipital neuralgia: Secondary | ICD-10-CM

## 2015-06-28 DIAGNOSIS — M5137 Other intervertebral disc degeneration, lumbosacral region: Secondary | ICD-10-CM

## 2015-06-28 DIAGNOSIS — M533 Sacrococcygeal disorders, not elsewhere classified: Secondary | ICD-10-CM

## 2015-06-28 MED ORDER — ORPHENADRINE CITRATE 30 MG/ML IJ SOLN
INTRAMUSCULAR | Status: AC
  Administered 2015-06-28: 60 mg
  Filled 2015-06-28: qty 2

## 2015-06-28 MED ORDER — MIDAZOLAM HCL 5 MG/5ML IJ SOLN
INTRAMUSCULAR | Status: AC
  Administered 2015-06-28: 3 mg via INTRAVENOUS
  Filled 2015-06-28: qty 5

## 2015-06-28 MED ORDER — FENTANYL CITRATE (PF) 100 MCG/2ML IJ SOLN
INTRAMUSCULAR | Status: AC
  Administered 2015-06-28: 50 ug via INTRAVENOUS
  Filled 2015-06-28: qty 2

## 2015-06-28 MED ORDER — BUPIVACAINE HCL (PF) 0.25 % IJ SOLN
INTRAMUSCULAR | Status: AC
  Administered 2015-06-28: 30 mL
  Filled 2015-06-28: qty 30

## 2015-06-28 MED ORDER — TRIAMCINOLONE ACETONIDE 40 MG/ML IJ SUSP
INTRAMUSCULAR | Status: AC
  Administered 2015-06-28: 40 mg
  Filled 2015-06-28: qty 1

## 2015-06-28 NOTE — Patient Instructions (Addendum)
Continue present medication oxycodone  F/U PCP Dr. Lennox Grumbles for evaliation of  BP and general medical  condition  F/U surgical evaluation  F/U neurological evaluation. Neurological evaluation of headaches as discussed   May consider radiofrequency rhizolysis or intraspinal procedures pending response to present treatment and F/U evaluation   Patient to call Pain Management Center should patient have concerns prior to scheduled return appointmen. Pain Management Discharge Instructions  General Discharge Instructions :  If you need to reach your doctor call: Monday-Friday 8:00 am - 4:00 pm at [phone] or toll free [phone].  After clinic hours [phone] to have operator reach doctor.  Bring all of your medication bottles to all your appointments in the pain clinic.  To cancel or reschedule your appointment with Pain Management please remember to call 24 hours in advance to avoid a fee.  Refer to the educational materials which you have been given on: General Risks, I had my Procedure. Discharge Instructions, Post Sedation.  Post Procedure Instructions:  The drugs you were given will stay in your system until tomorrow, so for the next 24 hours you should not drive, make any legal decisions or drink any alcoholic beverages.  You may eat anything you prefer, but it is better to start with liquids then soups and crackers, and gradually work up to solid foods.  Please notify your doctor immediately if you have any unusual bleeding, trouble breathing or pain that is not related to your normal pain.  Depending on the type of procedure that was done, some parts of your body may feel week and/or numb.  This usually clears up by tonight or the next day.  Walk with the use of an assistive device or accompanied by an adult for the 24 hours.  You may use ice on the affected area for the first 24 hours.  Put ice in a Ziploc bag and cover with a towel and place against area 15 minutes on  15 minutes off.  You may switch to heat after 24 hours.GENERAL RISKS AND COMPLICATIONS  What are the risk, side effects and possible complications? Generally speaking, most procedures are safe.  However, with any procedure there are risks, side effects, and the possibility of complications.  The risks and complications are dependent upon the sites that are lesioned, or the type of nerve block to be performed.  The closer the procedure is to the spine, the more serious the risks are.  Great care is taken when placing the radio frequency needles, block needles or lesioning probes, but sometimes complications can occur. 1. Infection: Any time there is an injection through the skin, there is a risk of infection.  This is why sterile conditions are used for these blocks.  There are four possible types of infection. 1. Localized skin infection. 2. Central Nervous System Infection-This can be in the form of Meningitis, which can be deadly. 3. Epidural Infections-This can be in the form of an epidural abscess, which can cause pressure inside of the spine, causing compression of the spinal cord with subsequent paralysis. This would require an emergency surgery to decompress, and there are no guarantees that the patient would recover from the paralysis. 4. Discitis-This is an infection of the intervertebral discs.  It occurs in about 1% of discography procedures.  It is difficult to treat and it may lead to surgery.        2. Pain: the needles have to go through skin and soft tissues, will cause soreness.  3. Damage to internal structures:  The nerves to be lesioned may be near blood vessels or    other nerves which can be potentially damaged.       4. Bleeding: Bleeding is more common if the patient is taking blood thinners such as  aspirin, Coumadin, Ticiid, Plavix, etc., or if he/she have some genetic predisposition  such as hemophilia. Bleeding into the spinal canal can cause compression of the spinal   cord with subsequent paralysis.  This would require an emergency surgery to  decompress and there are no guarantees that the patient would recover from the  paralysis.       5. Pneumothorax:  Puncturing of a lung is a possibility, every time a needle is introduced in  the area of the chest or upper back.  Pneumothorax refers to free air around the  collapsed lung(s), inside of the thoracic cavity (chest cavity).  Another two possible  complications related to a similar event would include: Hemothorax and Chylothorax.   These are variations of the Pneumothorax, where instead of air around the collapsed  lung(s), you may have blood or chyle, respectively.       6. Spinal headaches: They may occur with any procedures in the area of the spine.       7. Persistent CSF (Cerebro-Spinal Fluid) leakage: This is a rare problem, but may occur  with prolonged intrathecal or epidural catheters either due to the formation of a fistulous  track or a dural tear.       8. Nerve damage: By working so close to the spinal cord, there is always a possibility of  nerve damage, which could be as serious as a permanent spinal cord injury with  paralysis.       9. Death:  Although rare, severe deadly allergic reactions known as "Anaphylactic  reaction" can occur to any of the medications used.      10. Worsening of the symptoms:  We can always make thing worse.  What are the chances of something like this happening? Chances of any of this occuring are extremely low.  By statistics, you have more of a chance of getting killed in a motor vehicle accident: while driving to the hospital than any of the above occurring .  Nevertheless, you should be aware that they are possibilities.  In general, it is similar to taking a shower.  Everybody knows that you can slip, hit your head and get killed.  Does that mean that you should not shower again?  Nevertheless always keep in mind that statistics do not mean anything if you happen to be on  the wrong side of them.  Even if a procedure has a 1 (one) in a 1,000,000 (million) chance of going wrong, it you happen to be that one..Also, keep in mind that by statistics, you have more of a chance of having something go wrong when taking medications.  Who should not have this procedure? If you are on a blood thinning medication (e.g. Coumadin, Plavix, see list of "Blood Thinners"), or if you have an active infection going on, you should not have the procedure.  If you are taking any blood thinners, please inform your physician.  How should I prepare for this procedure?  Do not eat or drink anything at least six hours prior to the procedure.  Bring a driver with you .  It cannot be a taxi.  Come accompanied by an adult that can drive you back, and  that is strong enough to help you if your legs get weak or numb from the local anesthetic.  Take all of your medicines the morning of the procedure with just enough water to swallow them.  If you have diabetes, make sure that you are scheduled to have your procedure done first thing in the morning, whenever possible.  If you have diabetes, take only half of your insulin dose and notify our nurse that you have done so as soon as you arrive at the clinic.  If you are diabetic, but only take blood sugar pills (oral hypoglycemic), then do not take them on the morning of your procedure.  You may take them after you have had the procedure.  Do not take aspirin or any aspirin-containing medications, at least eleven (11) days prior to the procedure.  They may prolong bleeding.  Wear loose fitting clothing that may be easy to take off and that you would not mind if it got stained with Betadine or blood.  Do not wear any jewelry or perfume  Remove any nail coloring.  It will interfere with some of our monitoring equipment.  NOTE: Remember that this is not meant to be interpreted as a complete list of all possible complications.  Unforeseen problems  may occur.  BLOOD THINNERS The following drugs contain aspirin or other products, which can cause increased bleeding during surgery and should not be taken for 2 weeks prior to and 1 week after surgery.  If you should need take something for relief of minor pain, you may take acetaminophen which is found in Tylenol,m Datril, Anacin-3 and Panadol. It is not blood thinner. The products listed below are.  Do not take any of the products listed below in addition to any listed on your instruction sheet.  A.P.C or A.P.C with Codeine Codeine Phosphate Capsules #3 Ibuprofen Ridaura  ABC compound Congesprin Imuran rimadil  Advil Cope Indocin Robaxisal  Alka-Seltzer Effervescent Pain Reliever and Antacid Coricidin or Coricidin-D  Indomethacin Rufen  Alka-Seltzer plus Cold Medicine Cosprin Ketoprofen S-A-C Tablets  Anacin Analgesic Tablets or Capsules Coumadin Korlgesic Salflex  Anacin Extra Strength Analgesic tablets or capsules CP-2 Tablets Lanoril Salicylate  Anaprox Cuprimine Capsules Levenox Salocol  Anexsia-D Dalteparin Magan Salsalate  Anodynos Darvon compound Magnesium Salicylate Sine-off  Ansaid Dasin Capsules Magsal Sodium Salicylate  Anturane Depen Capsules Marnal Soma  APF Arthritis pain formula Dewitt's Pills Measurin Stanback  Argesic Dia-Gesic Meclofenamic Sulfinpyrazone  Arthritis Bayer Timed Release Aspirin Diclofenac Meclomen Sulindac  Arthritis pain formula Anacin Dicumarol Medipren Supac  Analgesic (Safety coated) Arthralgen Diffunasal Mefanamic Suprofen  Arthritis Strength Bufferin Dihydrocodeine Mepro Compound Suprol  Arthropan liquid Dopirydamole Methcarbomol with Aspirin Synalgos  ASA tablets/Enseals Disalcid Micrainin Tagament  Ascriptin Doan's Midol Talwin  Ascriptin A/D Dolene Mobidin Tanderil  Ascriptin Extra Strength Dolobid Moblgesic Ticlid  Ascriptin with Codeine Doloprin or Doloprin with Codeine Momentum Tolectin  Asperbuf Duoprin Mono-gesic Trendar  Aspergum  Duradyne Motrin or Motrin IB Triminicin  Aspirin plain, buffered or enteric coated Durasal Myochrisine Trigesic  Aspirin Suppositories Easprin Nalfon Trillsate  Aspirin with Codeine Ecotrin Regular or Extra Strength Naprosyn Uracel  Atromid-S Efficin Naproxen Ursinus  Auranofin Capsules Elmiron Neocylate Vanquish  Axotal Emagrin Norgesic Verin  Azathioprine Empirin or Empirin with Codeine Normiflo Vitamin E  Azolid Emprazil Nuprin Voltaren  Bayer Aspirin plain, buffered or children's or timed BC Tablets or powders Encaprin Orgaran Warfarin Sodium  Buff-a-Comp Enoxaparin Orudis Zorpin  Buff-a-Comp with Codeine Equegesic Os-Cal-Gesic   Buffaprin Excedrin plain, buffered  or Extra Strength Oxalid   Bufferin Arthritis Strength Feldene Oxphenbutazone   Bufferin plain or Extra Strength Feldene Capsules Oxycodone with Aspirin   Bufferin with Codeine Fenoprofen Fenoprofen Pabalate or Pabalate-SF   Buffets II Flogesic Panagesic   Buffinol plain or Extra Strength Florinal or Florinal with Codeine Panwarfarin   Buf-Tabs Flurbiprofen Penicillamine   Butalbital Compound Four-way cold tablets Penicillin   Butazolidin Fragmin Pepto-Bismol   Carbenicillin Geminisyn Percodan   Carna Arthritis Reliever Geopen Persantine   Carprofen Gold's salt Persistin   Chloramphenicol Goody's Phenylbutazone   Chloromycetin Haltrain Piroxlcam   Clmetidine heparin Plaquenil   Cllnoril Hyco-pap Ponstel   Clofibrate Hydroxy chloroquine Propoxyphen         Before stopping any of these medications, be sure to consult the physician who ordered them.  Some, such as Coumadin (Warfarin) are ordered to prevent or treat serious conditions such as "deep thrombosis", "pumonary embolisms", and other heart problems.  The amount of time that you may need off of the medication may also vary with the medication and the reason for which you were taking it.  If you are taking any of these medications, please make sure you notify your pain  physician before you undergo any procedures.

## 2015-06-28 NOTE — Progress Notes (Signed)
Safety precautions to be maintained throughout the outpatient stay will include: orient to surroundings, keep bed in low position, maintain call bell within reach at all times, provide assistance with transfer out of bed and ambulation.  

## 2015-06-28 NOTE — Progress Notes (Signed)
   Subjective:    Patient ID: [name], female    DOB: 10/18/1966, 50 y.o.   MRN: [id number]  HPI  NOTE: The patient is a 49 y.o.-year-old female who returns to the [facility] for further evaluation and treatment of pain consisting of pain involving the region of the neck and headache.  Patient is with prior history of headaches with pain radiating from the neck to the back of the head patient is with degenerative changes of the cervical spine and there is concern regarding significant component of headaches band due to greater occipital neuralgia as well as component of cervicogenic headaches .  The risks, benefits, and expectations of the procedure have been discussed and explained to patient, who is understanding and wishes to proceed with interventional treatment as discussed and as explained to patient.  Will proceed with greater occipital nerve blocks with myoneural block injections at this time as discussed and as explained to patient.  All are understanding and in agreement with suggested treatment plan.    PROCEDURE:  Greater occipital nerve block on the left side with IV Versed, IV Fentanyl, conscious sedation, EKG, blood pressure, pulse, pulse oximetry monitoring.  Procedure performed with patient in prone position.  Greater occipital nerve block on the left side.   With patient in prone position, Betadine prep of proposed entry site accomplished.  Following identification of the nuchal ridge, 22 -gauge needle was inserted at the level of the nuchal ridge medial to the occipital artery.  Following negative aspiration, 4cc 0.25% bupivacaine with Kenalog injected for left greater occipital nerve block.  Needle was removed.  Patient tolerated injection well.   Greater occipital nerve block on the rightt side. The greater occipital nerve block on the right side was performed exactly as the left greater occipital nerve block was performed and utilizing the same  technique.  Myoneural block injections of the cervical paraspinal musculature region. Following Betadine prep of proposed entry site a 22-gauge needle was inserted in the cervical paraspinal musculature region and following negative aspiration 2 cc of 0.25% bupivacaine with Norflex was injected for myoneural block injections of the cervical paraspinal musculature region performed 4 The patient tolerated procedure well   A total of 10 mg Kenalog was utilized for the entire procedure.  PLAN:    1. Medications: Will continue presently prescribed medication hydrocodone acetaminophen  at this time. 2. Patient to follow up with primary care physician Dr. Lennox Grumbles  for evaluation of blood pressure and general medical condition status post procedure performed on today's visit. 3. Neurological evaluation for further assessment of headaches for further studies as discussed. 4. Surgical evaluation as discussed.  5. Patient may be candidate for Botox injections, radiofrequency procedures, as well as implantation type procedures pending response to treatment rendered on today's visit and pending follow-up evaluation. 6. Patient has been advised to adhere to proper body mechanics and to avoid activities which appear to aggravate condition.cations:  Will continue presently prescribed medications at this time. 7. The patient is understanding and in agreement with the suggested treatment plan.     Review of Systems     Objective:   Physical Exam        Assessment & Plan:

## 2015-06-29 ENCOUNTER — Telehealth: Payer: Self-pay

## 2015-06-29 NOTE — Telephone Encounter (Signed)
Left message

## 2015-07-12 ENCOUNTER — Ambulatory Visit: Payer: Medicare Other | Attending: Pain Medicine | Admitting: Pain Medicine

## 2015-07-12 ENCOUNTER — Encounter: Payer: Self-pay | Admitting: Pain Medicine

## 2015-07-12 VITALS — BP 129/67 | HR 107 | Temp 98.4°F | Resp 18 | Ht 66.0 in | Wt 162.0 lb

## 2015-07-12 DIAGNOSIS — M533 Sacrococcygeal disorders, not elsewhere classified: Secondary | ICD-10-CM

## 2015-07-12 DIAGNOSIS — M51379 Other intervertebral disc degeneration, lumbosacral region without mention of lumbar back pain or lower extremity pain: Secondary | ICD-10-CM

## 2015-07-12 DIAGNOSIS — I739 Peripheral vascular disease, unspecified: Secondary | ICD-10-CM | POA: Diagnosis not present

## 2015-07-12 DIAGNOSIS — R51 Headache: Secondary | ICD-10-CM | POA: Diagnosis present

## 2015-07-12 DIAGNOSIS — M5030 Other cervical disc degeneration, unspecified cervical region: Secondary | ICD-10-CM

## 2015-07-12 DIAGNOSIS — M5481 Occipital neuralgia: Secondary | ICD-10-CM | POA: Diagnosis not present

## 2015-07-12 DIAGNOSIS — M5137 Other intervertebral disc degeneration, lumbosacral region: Secondary | ICD-10-CM

## 2015-07-12 DIAGNOSIS — M542 Cervicalgia: Secondary | ICD-10-CM | POA: Diagnosis present

## 2015-07-12 DIAGNOSIS — M5136 Other intervertebral disc degeneration, lumbar region: Secondary | ICD-10-CM | POA: Insufficient documentation

## 2015-07-12 MED ORDER — HYDROCODONE-ACETAMINOPHEN 10-325 MG PO TABS
ORAL_TABLET | ORAL | Status: DC
Start: 2015-07-12 — End: 2015-08-15

## 2015-07-12 NOTE — Progress Notes (Signed)
   Subjective:    Patient ID: [name], female    DOB: [birth year], 49 y.o.   MRN: [id number]  HPI  Patient is 49 year old female returns to [place] for further evaluation and treatment of pain involving the neck with pain of the neck radiating to the back of the head precipitating headaches. She missed significant relief of pain following greater occipital nerve blocks. Patient states that the greater occipital nerve blocks or controlling her headaches very well at this time. We discussed patient's condition and will proceed with greater occipital nerve block at time return appointment. The patient is with understanding and agreement with suggested treatment plan. We will continue presently prescribed medications.  Review of Systems     Objective:   Physical Exam There was tenderness of the splenius capitis and occipitalis musculature regions of moderate degree. There was moderate tenderness of the trapezius musculature region as well as the levator scapula and rhomboid musculature regions. Palpation of the cervical facet cervical paraspinal musculature region was associated with increased pain of moderate degree. No no lesions of the head head and neck were noted. No bounding pulsations of the temporal region were noted. There was mild tenderness of the temporomandibular joint region. Outpatient of the thoracic facet thoracic paraspinal must region was without increased pain of significant degree. There was no crepitus of the thoracic region noted. Patient was with unremarkable Spurling's maneuver and there was minimal tenderness of the acromioclavicular and glenohumeral joint regions. Palpation over the lumbar paraspinal muscle lumbar facet region was with mild to moderate tends to palpation. Lateral bending and rotation and extension and palpation of the lumbar facets reproduce mild to moderate discomfort straight leg raising was tolerated to approximately 30 and no sensory  deficit of dermatomal distribution was detected.. There was mild tends of the PSIS and PII S region as well as the gluteal and piriformis musculature region of mild tends to palpation of the greater trochanteric region and iliotibial band region. There was negative clonus negative Homans abdomen was nontender and no costovertebral.      Assessment & Plan:  Degenerative disc disease of the cervical spine  Cervical facet syndrome  Degenerative disc disease lumbar spine Lumbar facet syndrome  Peripherovascular disease of the lower extremities  Sacroiliitis sacroiliac joint dysfunction  Greater occipital neuralgia    Plan   Continue present medication hydrocodone acetaminophen  Greater occipital nerve block to be performed at time return appointment  F/U PCP Dr. Lennox Grumbles for evaliation of  BP and general medical  condition  F/U surgical evaluation  F/U neurological evaluation . We have discussed neurological evaluation of headaches and we will consider further neurological evaluation as discussed  May consider radiofrequency rhizolysis or intraspinal procedures pending response to present treatment and F/U evaluation   Patient to call Pain Management Center should patient have concerns prior to scheduled return appointmen.

## 2015-07-12 NOTE — Progress Notes (Signed)
Safety precautions to be maintained throughout the outpatient stay will include: orient to surroundings, keep bed in low position, maintain call bell within reach at all times, provide assistance with transfer out of bed and ambulation.  Discharged ambulatory at 12:55 pm

## 2015-07-12 NOTE — Patient Instructions (Addendum)
Continue present medication hydrocodone acetaminophen   Greater occipital nerve block to be performed at time of return appointment  F/U PCP Dr. Lennox Grumbles for evaliation of  BP and general medical  condition  F/U surgical evaluation  F/U neurological evaluation  May consider radiofrequency rhizolysis or intraspinal procedures pending response to present treatment and F/U evaluation   Patient to call Pain Management Center should patient have concerns prior to scheduled return appointmen.   Occipital Nerve Block Patient Information  Description: The occipital nerves originate in the cervical (neck) spinal cord and travel upward through muscle and tissue to supply sensation to the back of the head and top of the scalp.  In addition, the nerves control some of the muscles of the scalp.  Occipital neuralgia is an irritation of these nerves which can cause headaches, numbness of the scalp, and neck discomfort.     The occipital nerve block will interrupt nerve transmission through these nerves and can relieve pain and spasm.  The block consists of insertion of a small needle under the skin in the back of the head to deposit local anesthetic (numbing medicine) and/or steroids around the nerve.  The entire block usually lasts less than 5 minutes.  Conditions which may be treated by occipital blocks:   Muscular pain and spasm of the scalp  Nerve irritation, back of the head  Headaches  Upper neck pain  Preparation for the injection:  1. Do not eat any solid food or dairy products within 6 hours of your appointment. 2. You may drink clear liquids up to 2 hours before appointment.  Clear liquids include water, black coffee, juice or soda.  No milk or cream please. 3. You may take your regular medication, including pain medications, with a sip of water before you appointment.  Diabetics should hold regular insulin (if taken separately) and take 1/2 normal NPH dose the morning of the procedure.   Carry some sugar containing items with you to your appointment. 4. A driver must accompany you and be prepared to drive you home after your procedure. 5. Bring all your current medications with you. 6. An IV may be inserted and sedation may be given at the discretion of the physician. 7. A blood pressure cuff, EKG, and other monitors will often be applied during the procedure.  Some patients may need to have extra oxygen administered for a short period. 8. You will be asked to provide medical information, including your allergies and medications, prior to the procedure.  We must know immediately if you are taking blood thinners (like Coumadin/Warfarin) or if you are allergic to IV iodine contrast (dye).  We must know if you could possible be pregnant.  9. Do not wear a high collared shirt or turtleneck.  Tie long hair up in the back if possible.  Possible side-effects:   Bleeding from needle site  Infection (rare, may require surgery)  Nerve injury (rare)  Hair on back of neck can be tinged with iodine scrub (this will wash out)  Light-headedness (temporary)  Pain at injection site (several days)  Decreased blood pressure (rare, temporary)  Seizure (very rare)  Call if you experience:   Hives or difficulty breathing ( go to the emergency room)  Inflammation or drainage at the injection site(s)  Please note:  Although the local anesthetic injected can often make your painful muscles or headache feel good for several hours after the injection, the pain may return.  It takes 3-7 days for steroids to  work.  Dennis Bast may not notice any pain relief for at least one week.  If effective, we will often do a series of injections spaced 3-6 weeks apart to maximally decrease your pain.  If you have any questions, please call [phone] [facility]    A prescription for HYDROCODONE was given to you today.

## 2015-07-20 ENCOUNTER — Other Ambulatory Visit: Payer: Self-pay | Admitting: Pain Medicine

## 2015-07-31 ENCOUNTER — Ambulatory Visit: Payer: Medicare Other | Attending: Pain Medicine | Admitting: Pain Medicine

## 2015-07-31 ENCOUNTER — Encounter: Payer: Self-pay | Admitting: Pain Medicine

## 2015-07-31 VITALS — BP 145/82 | HR 106 | Temp 98.1°F | Resp 14 | Ht 66.0 in | Wt 162.0 lb

## 2015-07-31 DIAGNOSIS — M5481 Occipital neuralgia: Secondary | ICD-10-CM

## 2015-07-31 DIAGNOSIS — M542 Cervicalgia: Secondary | ICD-10-CM | POA: Diagnosis present

## 2015-07-31 DIAGNOSIS — M51379 Other intervertebral disc degeneration, lumbosacral region without mention of lumbar back pain or lower extremity pain: Secondary | ICD-10-CM

## 2015-07-31 DIAGNOSIS — R51 Headache: Secondary | ICD-10-CM | POA: Insufficient documentation

## 2015-07-31 DIAGNOSIS — M47812 Spondylosis without myelopathy or radiculopathy, cervical region: Secondary | ICD-10-CM | POA: Insufficient documentation

## 2015-07-31 DIAGNOSIS — M5030 Other cervical disc degeneration, unspecified cervical region: Secondary | ICD-10-CM

## 2015-07-31 DIAGNOSIS — M533 Sacrococcygeal disorders, not elsewhere classified: Secondary | ICD-10-CM

## 2015-07-31 DIAGNOSIS — M5137 Other intervertebral disc degeneration, lumbosacral region: Secondary | ICD-10-CM

## 2015-07-31 MED ORDER — TRIAMCINOLONE ACETONIDE 40 MG/ML IJ SUSP
INTRAMUSCULAR | Status: AC
  Administered 2015-07-31: 11:00:00
  Filled 2015-07-31: qty 1

## 2015-07-31 MED ORDER — FENTANYL CITRATE (PF) 100 MCG/2ML IJ SOLN
INTRAMUSCULAR | Status: AC
  Administered 2015-07-31: 50 ug via INTRAVENOUS
  Filled 2015-07-31: qty 2

## 2015-07-31 MED ORDER — BUPIVACAINE HCL (PF) 0.25 % IJ SOLN
INTRAMUSCULAR | Status: AC
  Administered 2015-07-31: 11:00:00
  Filled 2015-07-31: qty 30

## 2015-07-31 MED ORDER — MIDAZOLAM HCL 5 MG/5ML IJ SOLN
INTRAMUSCULAR | Status: AC
  Administered 2015-07-31: 2 mg via INTRAVENOUS
  Filled 2015-07-31: qty 5

## 2015-07-31 MED ORDER — ORPHENADRINE CITRATE 30 MG/ML IJ SOLN
INTRAMUSCULAR | Status: AC
  Administered 2015-07-31: 11:00:00
  Filled 2015-07-31: qty 2

## 2015-07-31 NOTE — Patient Instructions (Addendum)
PLAN   Continue present medication hydrocodone acetaminophen  F/U PCP Dr. Lennox Grumbles for evaliation of  BP and general medical  condition  F/U surgical evaluation. May consider pending follow-up evaluations  F/U neurological evaluation. May consider pending follow-up evaluations  May consider radiofrequency rhizolysis or intraspinal procedures pending response to present treatment and F/U evaluation   Patient to call [facility] should patient have concerns prior to scheduled return appointment. Pain Management Discharge Instructions  General Discharge Instructions :  If you need to reach your doctor call: Monday-Friday 8:00 am - 4:00 pm at [phone] or toll free [phone].  After clinic hours [phone] to have operator reach doctor.  Bring all of your medication bottles to all your appointments in the pain clinic.  To cancel or reschedule your appointment with Pain Management please remember to call 24 hours in advance to avoid a fee.  Refer to the educational materials which you have been given on: General Risks, I had my Procedure. Discharge Instructions, Post Sedation.  Post Procedure Instructions:  The drugs you were given will stay in your system until tomorrow, so for the next 24 hours you should not drive, make any legal decisions or drink any alcoholic beverages.  You may eat anything you prefer, but it is better to start with liquids then soups and crackers, and gradually work up to solid foods.  Please notify your doctor immediately if you have any unusual bleeding, trouble breathing or pain that is not related to your normal pain.  Depending on the type of procedure that was done, some parts of your body may feel week and/or numb.  This usually clears up by tonight or the next day.  Walk with the use of an assistive device or accompanied by an adult for the 24 hours.  You may use ice on the affected area for the first 24 hours.  Put ice in a Ziploc  bag and cover with a towel and place against area 15 minutes on 15 minutes off.  You may switch to heat after 24 hours.Occipital Nerve Block Patient Information  Description: The occipital nerves originate in the cervical (neck) spinal cord and travel upward through muscle and tissue to supply sensation to the back of the head and top of the scalp.  In addition, the nerves control some of the muscles of the scalp.  Occipital neuralgia is an irritation of these nerves which can cause headaches, numbness of the scalp, and neck discomfort.     The occipital nerve block will interrupt nerve transmission through these nerves and can relieve pain and spasm.  The block consists of insertion of a small needle under the skin in the back of the head to deposit local anesthetic (numbing medicine) and/or steroids around the nerve.  The entire block usually lasts less than 5 minutes.  Conditions which may be treated by occipital blocks:   Muscular pain and spasm of the scalp  Nerve irritation, back of the head  Headaches  Upper neck pain  Preparation for the injection:  1. Do not eat any solid food or dairy products within 6 hours of your appointment. 2. You may drink clear liquids up to 2 hours before appointment.  Clear liquids include water, black coffee, juice or soda.  No milk or cream please. 3. You may take your regular medication, including pain medications, with a sip of water before you appointment.  Diabetics should hold regular insulin (if taken separately) and take 1/2 normal NPH dose the  morning of the procedure.  Carry some sugar containing items with you to your appointment. 4. A driver must accompany you and be prepared to drive you home after your procedure. 5. Bring all your current medications with you. 6. An IV may be inserted and sedation may be given at the discretion of the physician. 7. A blood pressure cuff, EKG, and other monitors will often be applied during the procedure.   Some patients may need to have extra oxygen administered for a short period. 8. You will be asked to provide medical information, including your allergies and medications, prior to the procedure.  We must know immediately if you are taking blood thinners (like Coumadin/Warfarin) or if you are allergic to IV iodine contrast (dye).  We must know if you could possible be pregnant.  9. Do not wear a high collared shirt or turtleneck.  Tie long hair up in the back if possible.  Possible side-effects:   Bleeding from needle site  Infection (rare, may require surgery)  Nerve injury (rare)  Hair on back of neck can be tinged with iodine scrub (this will wash out)  Light-headedness (temporary)  Pain at injection site (several days)  Decreased blood pressure (rare, temporary)  Seizure (very rare)  Call if you experience:   Hives or difficulty breathing ( go to the emergency room)  Inflammation or drainage at the injection site(s)  Please note:  Although the local anesthetic injected can often make your painful muscles or headache feel good for several hours after the injection, the pain may return.  It takes 3-7 days for steroids to work.  You may not notice any pain relief for at least one week.  If effective, we will often do a series of injections spaced 3-6 weeks apart to maximally decrease your pain.  If you have any questions, please call [phone] [facility]

## 2015-07-31 NOTE — Progress Notes (Signed)
Safety precautions to be maintained throughout the outpatient stay will include: orient to surroundings, keep bed in low position, maintain call bell within reach at all times, provide assistance with transfer out of bed and ambulation.  

## 2015-07-31 NOTE — Progress Notes (Signed)
Subjective:    Patient ID: [name], female    DOB: 1966/02/15, 49 y.o.   MRN: [id number]  HPI  NOTE: The patient is a 49 y.o.-year-old female who returns to the [facility] for further evaluation and treatment of pain consisting of pain involving the region of the neck and headache.  Patient is with prior studies revealing patient to be with degenerative changes of the cervical spine with history of headaches with concern regarding greater occipital neuralgia and myofascial pain related headaches as well as component of migraine headache. Patient's headaches began the base of the neck radiating to the base of the skull and continued forward to the retro-orbital region patient denies trauma or change in events of daily living the cause change in symptoms. There is concern regarding significant component of patient's pain being due to greater occipital neuralgia and myofascial pain related headaches .  The risks, benefits, and expectations of the procedure have been discussed and explained to patient, who is understanding and wishes to proceed with interventional treatment as discussed and as explained to patient.  Will proceed with greater occipital nerve blocks with myoneural block injections at this time as discussed and as explained to patient.  All are understanding and in agreement with suggested treatment plan.    PROCEDURE:  Greater occipital nerve block on the left side with IV Versed, IV Fentanyl, conscious sedation, EKG, blood pressure, pulse, pulse oximetry monitoring.  Procedure performed with patient in prone position.  Greater occipital nerve block on the left side.   With patient in prone position, Betadine prep of proposed entry site accomplished.  Following identification of the nuchal ridge, 22 -gauge needle was inserted at the level of the nuchal ridge medial to the occipital artery.  Following negative aspiration, 4cc 0.25% bupivacaine with Kenalog injected for left  greater occipital nerve block.  Needle was removed.  Patient tolerated injection well.   Greater occipital nerve block on the rightt side. The greater occipital nerve block on the right side was performed exactly as the left greater occipital nerve block was performed and utilizing the same technique.   Myoneural block injections of the cervical paraspinal musculature region. Following Betadine prep of proposed entry site a 22-gauge needle was inserted in the cervical paraspinal musculature region and following negative aspiration 2 cc of 0.25% bupivacaine with Norflex was injected for myoneural block injection 4  The patient tolerated the procedure well   A total of 10 mg Kenalog was utilized for the entire procedure.  PLAN:    1. Medications: Will continue presently prescribed medications at this time. Hydrocodone acetaminophen 2. Patient to follow up with primary care physician Dr. Lennox Grumbles for evaluation of blood pressure and general medical condition status post procedure performed on today's visit.. We will schedule patient appointment Dr. Lennox Grumbles for this week for evaluation of blood pressure and general medical condition. Patient denied any change in symptoms and was without complaint of chest pain nausea vomiting. Patient without cardiovascular complaints on today's visit 3. Neurological evaluation for further assessment of headaches for further studies as discussed. 4. Surgical evaluation as discussed.  5. Patient may be candidate for Botox injections, radiofrequency procedures, as well as implantation type procedures pending response to treatment rendered on today's visit and pending follow-up evaluation. 6. Patient has been advised to adhere to proper body mechanics and to avoid activities which appear to aggravate condition.cations:  Will continue presently prescribed medications at this time. 7. The patient is understanding and  in agreement with the suggested treatment plan.     Review of Systems     Objective:   Physical Exam        Assessment & Plan:

## 2015-08-01 ENCOUNTER — Telehealth: Payer: Self-pay

## 2015-08-01 NOTE — Telephone Encounter (Signed)
Unable to leave message.  messsage says this number cant take calls.

## 2015-08-15 ENCOUNTER — Ambulatory Visit: Payer: Medicare Other | Attending: Pain Medicine | Admitting: Pain Medicine

## 2015-08-15 VITALS — BP 165/98 | HR 109 | Temp 98.3°F | Resp 16 | Ht 66.0 in | Wt 162.0 lb

## 2015-08-15 DIAGNOSIS — M5137 Other intervertebral disc degeneration, lumbosacral region: Secondary | ICD-10-CM

## 2015-08-15 DIAGNOSIS — M51379 Other intervertebral disc degeneration, lumbosacral region without mention of lumbar back pain or lower extremity pain: Secondary | ICD-10-CM

## 2015-08-15 DIAGNOSIS — M5136 Other intervertebral disc degeneration, lumbar region: Secondary | ICD-10-CM | POA: Insufficient documentation

## 2015-08-15 DIAGNOSIS — M533 Sacrococcygeal disorders, not elsewhere classified: Secondary | ICD-10-CM | POA: Diagnosis not present

## 2015-08-15 DIAGNOSIS — I739 Peripheral vascular disease, unspecified: Secondary | ICD-10-CM | POA: Diagnosis not present

## 2015-08-15 DIAGNOSIS — M542 Cervicalgia: Secondary | ICD-10-CM | POA: Diagnosis present

## 2015-08-15 DIAGNOSIS — M461 Sacroiliitis, not elsewhere classified: Secondary | ICD-10-CM | POA: Insufficient documentation

## 2015-08-15 DIAGNOSIS — M5481 Occipital neuralgia: Secondary | ICD-10-CM

## 2015-08-15 DIAGNOSIS — M5030 Other cervical disc degeneration, unspecified cervical region: Secondary | ICD-10-CM | POA: Diagnosis not present

## 2015-08-15 DIAGNOSIS — I7092 Chronic total occlusion of artery of the extremities: Secondary | ICD-10-CM

## 2015-08-15 DIAGNOSIS — M47816 Spondylosis without myelopathy or radiculopathy, lumbar region: Secondary | ICD-10-CM

## 2015-08-15 DIAGNOSIS — M25559 Pain in unspecified hip: Secondary | ICD-10-CM

## 2015-08-15 DIAGNOSIS — M545 Low back pain: Secondary | ICD-10-CM | POA: Diagnosis present

## 2015-08-15 MED ORDER — HYDROCODONE-ACETAMINOPHEN 10-325 MG PO TABS: ORAL_TABLET | ORAL | Status: DC

## 2015-08-15 NOTE — Patient Instructions (Addendum)
PLAN   Continue present medication hydrocodone acetaminophen   Lumbar facet, medial branch nerve, blocks to be performed at time of return appointment  F/U PCP Dr. Lennox Grumbles for evaliation of  BP and general medical  condition  F/U surgical evaluation. May consider pending follow-up evaluations  F/U neurological evaluation. May consider pending follow-up evaluations  May consider radiofrequency rhizolysis or intraspinal procedures pending response to present treatment and F/U evaluation   Patient to call Pain Management Center should patient have concerns prior to scheduled return appointment. Facet Joint Block The facet joints connect the bones of the spine (vertebrae). They make it possible for you to bend, twist, and make other movements with your spine. They also prevent you from overbending, overtwisting, and making other excessive movements.  A facet joint block is a procedure where a numbing medicine (anesthetic) is injected into a facet joint. Often, a type of anti-inflammatory medicine called a steroid is also injected. A facet joint block may be done for two reasons:   Diagnosis. A facet joint block may be done as a test to see whether neck or back pain is caused by a worn-down or infected facet joint. If the pain gets better after a facet joint block, it means the pain is probably coming from the facet joint. If the pain does not get better, it means the pain is probably not coming from the facet joint.   Therapy. A facet joint block may be done to relieve neck or back pain caused by a facet joint. A facet joint block is only done as a therapy if the pain does not improve with medicine, exercise programs, physical therapy, and other forms of pain management. LET [facility] CARE PROVIDER KNOW ABOUT:   Any allergies you have.   All medicines you are taking, including vitamins, herbs, eyedrops, and over-the-counter medicines and creams.   Previous problems you or members of your  family have had with the use of anesthetics.   Any blood disorders you have had.   Other health problems you have. RISKS AND COMPLICATIONS Generally, having a facet joint block is safe. However, as with any procedure, complications can occur. Possible complications associated with having a facet joint block include:   Bleeding.   Injury to a nerve near the injection site.   Pain at the injection site.   Weakness or numbness in areas controlled by nerves near the injection site.   Infection.   Temporary fluid retention.   Allergic reaction to anesthetics or medicines used during the procedure. BEFORE THE PROCEDURE   Follow your health care provider's instructions if you are taking dietary supplements or medicines. You may need to stop taking them or reduce your dosage.   Do not take any new dietary supplements or medicines without asking your health care provider first.   Follow your health care provider's instructions about eating and drinking before the procedure. You may need to stop eating and drinking several hours before the procedure.   Arrange to have an adult drive you home after the procedure. PROCEDURE  You may need to remove your clothing and dress in an open-back gown so that your health care provider can access your spine.   The procedure will be done while you are lying on an X-ray table. Most of the time you will be asked to lie on your stomach, but you may be asked to lie in a different position if an injection will be made in your neck.   Special  machines will be used to monitor your oxygen levels, heart rate, and blood pressure.   If an injection will be made in your neck, an intravenous (IV) tube will be inserted into one of your veins. Fluids and medicine will flow directly into your body through the IV tube.   The area over the facet joint where the injection will be made will be cleaned with an antiseptic soap. The surrounding skin will be  covered with sterile drapes.   An anesthetic will be applied to your skin to make the injection area numb. You may feel a temporary stinging or burning sensation.   A video X-ray machine will be used to locate the joint. A contrast dye may be injected into the facet joint area to help with locating the joint.   When the joint is located, an anesthetic medicine will be injected into the joint through the needle.   Your health care provider will ask you whether you feel pain relief. If you do feel relief, a steroid may be injected to provide pain relief for a longer period of time. If you do not feel relief or feel only partial relief, additional injections of an anesthetic may be made in other facet joints.   The needle will be removed, the skin will be cleansed, and bandages will be applied.  AFTER THE PROCEDURE   You will be observed for 15-30 minutes before being allowed to go home. Do not drive. Have an adult drive you or take a taxi or public transportation instead.   If you feel pain relief, the pain will return in several hours or days when the anesthetic wears off.   You may feel pain relief 2-14 days after the procedure. The amount of time this relief lasts varies from person to person.   It is normal to feel some tenderness over the injected area(s) for 2 days following the procedure.   If you have diabetes, you may have a temporary increase in blood sugar. Document Released: 03/26/2007 Document Revised: 03/21/2014 Document Reviewed: 08/24/2012 [facility] Patient Information 2015 [place], [place]. This information is not intended to replace advice given to you by your health care provider. Make sure you discuss any questions you have with your health care provider. GENERAL RISKS AND COMPLICATIONS  What are the risk, side effects and possible complications? Generally speaking, most procedures are safe.  However, with any procedure there are risks, side effects, and the  possibility of complications.  The risks and complications are dependent upon the sites that are lesioned, or the type of nerve block to be performed.  The closer the procedure is to the spine, the more serious the risks are.  Great care is taken when placing the radio frequency needles, block needles or lesioning probes, but sometimes complications can occur.  Infection: Any time there is an injection through the skin, there is a risk of infection.  This is why sterile conditions are used for these blocks.  There are four possible types of infection.  Localized skin infection.  Central Nervous System Infection-This can be in the form of Meningitis, which can be deadly.  Epidural Infections-This can be in the form of an epidural abscess, which can cause pressure inside of the spine, causing compression of the spinal cord with subsequent paralysis. This would require an emergency surgery to decompress, and there are no guarantees that the patient would recover from the paralysis.  Discitis-This is an infection of the intervertebral discs.  It occurs in  about 1% of discography procedures.  It is difficult to treat and it may lead to surgery.        2. Pain: the needles have to go through skin and soft tissues, will cause soreness.       3. Damage to internal structures:  The nerves to be lesioned may be near blood vessels or    other nerves which can be potentially damaged.       4. Bleeding: Bleeding is more common if the patient is taking blood thinners such as  aspirin, Coumadin, Ticiid, Plavix, etc., or if he/she have some genetic predisposition  such as hemophilia. Bleeding into the spinal canal can cause compression of the spinal  cord with subsequent paralysis.  This would require an emergency surgery to  decompress and there are no guarantees that the patient would recover from the  paralysis.       5. Pneumothorax:  Puncturing of a lung is a possibility, every time a needle is introduced in   the area of the chest or upper back.  Pneumothorax refers to free air around the  collapsed lung(s), inside of the thoracic cavity (chest cavity).  Another two possible  complications related to a similar event would include: Hemothorax and Chylothorax.   These are variations of the Pneumothorax, where instead of air around the collapsed  lung(s), you may have blood or chyle, respectively.       6. Spinal headaches: They may occur with any procedures in the area of the spine.       7. Persistent CSF (Cerebro-Spinal Fluid) leakage: This is a rare problem, but may occur  with prolonged intrathecal or epidural catheters either due to the formation of a fistulous  track or a dural tear.       8. Nerve damage: By working so close to the spinal cord, there is always a possibility of  nerve damage, which could be as serious as a permanent spinal cord injury with  paralysis.       9. Death:  Although rare, severe deadly allergic reactions known as "Anaphylactic  reaction" can occur to any of the medications used.      10. Worsening of the symptoms:  We can always make thing worse.  What are the chances of something like this happening? Chances of any of this occuring are extremely low.  By statistics, you have more of a chance of getting killed in a motor vehicle accident: while driving to the hospital than any of the above occurring .  Nevertheless, you should be aware that they are possibilities.  In general, it is similar to taking a shower.  Everybody knows that you can slip, hit your head and get killed.  Does that mean that you should not shower again?  Nevertheless always keep in mind that statistics do not mean anything if you happen to be on the wrong side of them.  Even if a procedure has a 1 (one) in a 1,000,000 (million) chance of going wrong, it you happen to be that one..Also, keep in mind that by statistics, you have more of a chance of having something go wrong when taking medications.  Who should  not have this procedure? If you are on a blood thinning medication (e.g. Coumadin, Plavix, see list of "Blood Thinners"), or if you have an active infection going on, you should not have the procedure.  If you are taking any blood thinners, please inform your physician.  How should  I prepare for this procedure?  Do not eat or drink anything at least six hours prior to the procedure.  Bring a driver with you .  It cannot be a taxi.  Come accompanied by an adult that can drive you back, and that is strong enough to help you if your legs get weak or numb from the local anesthetic.  Take all of your medicines the morning of the procedure with just enough water to swallow them.  If you have diabetes, make sure that you are scheduled to have your procedure done first thing in the morning, whenever possible.  If you have diabetes, take only half of your insulin dose and notify our nurse that you have done so as soon as you arrive at the clinic.  If you are diabetic, but only take blood sugar pills (oral hypoglycemic), then do not take them on the morning of your procedure.  You may take them after you have had the procedure.  Do not take aspirin or any aspirin-containing medications, at least eleven (11) days prior to the procedure.  They may prolong bleeding.  Wear loose fitting clothing that may be easy to take off and that you would not mind if it got stained with Betadine or blood.  Do not wear any jewelry or perfume  Remove any nail coloring.  It will interfere with some of our monitoring equipment.  NOTE: Remember that this is not meant to be interpreted as a complete list of all possible complications.  Unforeseen problems may occur.  BLOOD THINNERS The following drugs contain aspirin or other products, which can cause increased bleeding during surgery and should not be taken for 2 weeks prior to and 1 week after surgery.  If you should need take something for relief of minor pain, you  may take acetaminophen which is found in Tylenol,m Datril, Anacin-3 and Panadol. It is not blood thinner. The products listed below are.  Do not take any of the products listed below in addition to any listed on your instruction sheet.  A.P.C or A.P.C with Codeine Codeine Phosphate Capsules #3 Ibuprofen Ridaura  ABC compound Congesprin Imuran rimadil  Advil Cope Indocin Robaxisal  Alka-Seltzer Effervescent Pain Reliever and Antacid Coricidin or Coricidin-D  Indomethacin Rufen  Alka-Seltzer plus Cold Medicine Cosprin Ketoprofen S-A-C Tablets  Anacin Analgesic Tablets or Capsules Coumadin Korlgesic Salflex  Anacin Extra Strength Analgesic tablets or capsules CP-2 Tablets Lanoril Salicylate  Anaprox Cuprimine Capsules Levenox Salocol  Anexsia-D Dalteparin Magan Salsalate  Anodynos Darvon compound Magnesium Salicylate Sine-off  Ansaid Dasin Capsules Magsal Sodium Salicylate  Anturane Depen Capsules Marnal Soma  APF Arthritis pain formula Dewitt's Pills Measurin Stanback  Argesic Dia-Gesic Meclofenamic Sulfinpyrazone  Arthritis Bayer Timed Release Aspirin Diclofenac Meclomen Sulindac  Arthritis pain formula Anacin Dicumarol Medipren Supac  Analgesic (Safety coated) Arthralgen Diffunasal Mefanamic Suprofen  Arthritis Strength Bufferin Dihydrocodeine Mepro Compound Suprol  Arthropan liquid Dopirydamole Methcarbomol with Aspirin Synalgos  ASA tablets/Enseals Disalcid Micrainin Tagament  Ascriptin Doan's Midol Talwin  Ascriptin A/D Dolene Mobidin Tanderil  Ascriptin Extra Strength Dolobid Moblgesic Ticlid  Ascriptin with Codeine Doloprin or Doloprin with Codeine Momentum Tolectin  Asperbuf Duoprin Mono-gesic Trendar  Aspergum Duradyne Motrin or Motrin IB Triminicin  Aspirin plain, buffered or enteric coated Durasal Myochrisine Trigesic  Aspirin Suppositories Easprin Nalfon Trillsate  Aspirin with Codeine Ecotrin Regular or Extra Strength Naprosyn Uracel  Atromid-S Efficin Naproxen Ursinus   Auranofin Capsules Elmiron Neocylate Vanquish  Axotal Emagrin Norgesic Verin  Azathioprine Empirin or  Empirin with Codeine Normiflo Vitamin E  Azolid Emprazil Nuprin Voltaren  Bayer Aspirin plain, buffered or children's or timed BC Tablets or powders Encaprin Orgaran Warfarin Sodium  Buff-a-Comp Enoxaparin Orudis Zorpin  Buff-a-Comp with Codeine Equegesic Os-Cal-Gesic   Buffaprin Excedrin plain, buffered or Extra Strength Oxalid   Bufferin Arthritis Strength Feldene Oxphenbutazone   Bufferin plain or Extra Strength Feldene Capsules Oxycodone with Aspirin   Bufferin with Codeine Fenoprofen Fenoprofen Pabalate or Pabalate-SF   Buffets II Flogesic Panagesic   Buffinol plain or Extra Strength Florinal or Florinal with Codeine Panwarfarin   Buf-Tabs Flurbiprofen Penicillamine   Butalbital Compound Four-way cold tablets Penicillin   Butazolidin Fragmin Pepto-Bismol   Carbenicillin Geminisyn Percodan   Carna Arthritis Reliever Geopen Persantine   Carprofen Gold's salt Persistin   Chloramphenicol Goody's Phenylbutazone   Chloromycetin Haltrain Piroxlcam   Clmetidine heparin Plaquenil   Cllnoril Hyco-pap Ponstel   Clofibrate Hydroxy chloroquine Propoxyphen         Before stopping any of these medications, be sure to consult the physician who ordered them.  Some, such as Coumadin (Warfarin) are ordered to prevent or treat serious conditions such as "deep thrombosis", "pumonary embolisms", and other heart problems.  The amount of time that you may need off of the medication may also vary with the medication and the reason for which you were taking it.  If you are taking any of these medications, please make sure you notify your pain physician before you undergo any procedures.

## 2015-08-15 NOTE — Progress Notes (Signed)
Subjective:    Patient ID: [name], female    DOB: 1966-08-14, 49 y.o.   MRN: [id number]  HPI  Patient is 49 year old female returns to [place] for further evaluation and treatment of pain involving the region of the neck upper mid and lower back lower extremity region and headaches. Patient states that present pain involves the lower back and lower extremity regions predominantly. Patient states that his been significantly increased lower back lower extremity pain aggravated by standing walking twisting turning maneuvers. Patient states the pain becomes more intense as the day progresses and patient has difficulty turning over in bed due to severe back pain. Discussed patient's condition and will continue present medications and consider patient for lumbar facet, medial branch nerve blocks, to be performed at time of return appointment in attempt to decrease severity of patient's symptoms, minimize progression of symptoms, and avoid the need for more involved treatment. The patient was in agreement with suggested treatment plan. We will proceed with lumbar facet, medial branch nerve blocks, which is felt to be medically necessary procedure in attempt to decrease severity of symptoms, minimize progression of symptoms, and avoid need for more involved treatment.      Review of Systems     Objective:   Physical Exam  There was tenderness over the splenius capitis and occipitalis musculature regions of mild degree. No excessive tends to palpation of the temporal region was noted. No bounding pulsations of the temporal region noted. There was mild tenderness of the cervical facet cervical paraspinal musculature region. There was mild tinnitus of the acromial clavicular and glenohumeral joint regions. There was mild tends to palpation over the thoracic facet thoracic paraspinal musculature. No crepitus of the thoracic region was noted. There was tends to palpation of the lower  thoracic paraspinal muscles region of moderate degree with moderate muscle spasms being noted. Patient appeared to be with bilaterally equal grip strength. Tinel and Phalen's maneuver were without increase of pain of significant degree. Palpation over the lumbar paraspinal muscular region lumbar facet region was with increased pain of moderately severe degree. Lateral bending and rotation and extension and palpation of the lumbar facets reproduce moderately severe discomfort. There was moderate tends to palpation of the PSIS and PII S region as well as the gluteal and piriformis musculature regions. Palpation over the region of the greater trochanteric region and iliotibial band region reproduced mild discomfort. Straight leg raising tolerates approximately 30 without a definite increase of pain with dorsiflexion noted. There was negative clonus negative Homans. DTRs were difficult to elicit patient had difficulty relaxing. There was negative clonus negative Homans. Abdomen was nontender with no costovertebral angle tenderness noted.      Assessment & Plan:    Degenerative disc disease of the cervical spine  Cervical facet syndrome  Degenerative disc disease lumbar spine  Lumbar facet syndrome  Peripherovascular disease of the lower extremities  Sacroiliitis sacroiliac joint dysfunction     PLAN   Continue present medication hydrocodone acetaminophen  Lumbar facet, medial branch nerve, blocks to be performed at time return appointment  F/U PCP Dr. Lennox Grumbles for evaliation of  BP and general medical  condition  F/U surgical evaluation. May consider pending follow-up evaluations  F/U neurological evaluation. May consider pending follow-up evaluations  May consider radiofrequency rhizolysis or intraspinal procedures pending response to present treatment and F/U evaluation   Patient to call Pain Management Center should patient have concerns prior to scheduled return  appointment.  Greater occipital neuralgia

## 2015-08-23 ENCOUNTER — Other Ambulatory Visit: Payer: Self-pay | Admitting: Pain Medicine

## 2015-09-04 ENCOUNTER — Ambulatory Visit: Payer: Medicare Other | Admitting: Pain Medicine

## 2015-09-11 ENCOUNTER — Ambulatory Visit: Payer: Medicare Other | Attending: Pain Medicine | Admitting: Pain Medicine

## 2015-09-11 VITALS — BP 95/52 | HR 91 | Temp 97.6°F | Resp 14 | Ht 66.0 in | Wt 162.0 lb

## 2015-09-11 DIAGNOSIS — M79606 Pain in leg, unspecified: Secondary | ICD-10-CM | POA: Insufficient documentation

## 2015-09-11 DIAGNOSIS — Z981 Arthrodesis status: Secondary | ICD-10-CM | POA: Insufficient documentation

## 2015-09-11 DIAGNOSIS — M545 Low back pain: Secondary | ICD-10-CM | POA: Insufficient documentation

## 2015-09-11 DIAGNOSIS — M5136 Other intervertebral disc degeneration, lumbar region: Secondary | ICD-10-CM | POA: Insufficient documentation

## 2015-09-11 DIAGNOSIS — M25559 Pain in unspecified hip: Secondary | ICD-10-CM

## 2015-09-11 DIAGNOSIS — M5137 Other intervertebral disc degeneration, lumbosacral region: Secondary | ICD-10-CM

## 2015-09-11 DIAGNOSIS — M533 Sacrococcygeal disorders, not elsewhere classified: Secondary | ICD-10-CM

## 2015-09-11 DIAGNOSIS — M51379 Other intervertebral disc degeneration, lumbosacral region without mention of lumbar back pain or lower extremity pain: Secondary | ICD-10-CM

## 2015-09-11 DIAGNOSIS — I7092 Chronic total occlusion of artery of the extremities: Secondary | ICD-10-CM

## 2015-09-11 DIAGNOSIS — M5030 Other cervical disc degeneration, unspecified cervical region: Secondary | ICD-10-CM

## 2015-09-11 DIAGNOSIS — I739 Peripheral vascular disease, unspecified: Secondary | ICD-10-CM

## 2015-09-11 DIAGNOSIS — M5481 Occipital neuralgia: Secondary | ICD-10-CM

## 2015-09-11 DIAGNOSIS — M47816 Spondylosis without myelopathy or radiculopathy, lumbar region: Secondary | ICD-10-CM

## 2015-09-11 MED ORDER — LIDOCAINE HCL (PF) 1 % IJ SOLN: 10.0000 mL | Freq: Once | INTRAMUSCULAR | Status: DC

## 2015-09-11 MED ORDER — MIDAZOLAM HCL 5 MG/5ML IJ SOLN
5.0000 mg | Freq: Once | INTRAMUSCULAR | Status: AC
  Administered 2015-09-11: 3 mg via INTRAVENOUS

## 2015-09-11 MED ORDER — CIPROFLOXACIN IN D5W 400 MG/200ML IV SOLN
INTRAVENOUS | Status: AC
  Administered 2015-09-11: 12:00:00 via INTRAVENOUS
  Filled 2015-09-11: qty 200

## 2015-09-11 MED ORDER — LACTATED RINGERS IV SOLN
1000.0000 mL | INTRAVENOUS | Status: DC
Start: 2015-09-11 — End: 2016-05-07

## 2015-09-11 MED ORDER — CIPROFLOXACIN HCL 250 MG PO TABS: 250.0000 mg | ORAL_TABLET | Freq: Two times a day (BID) | ORAL | Status: DC

## 2015-09-11 MED ORDER — ORPHENADRINE CITRATE 30 MG/ML IJ SOLN: 60.0000 mg | Freq: Once | INTRAMUSCULAR | Status: DC

## 2015-09-11 MED ORDER — TRIAMCINOLONE ACETONIDE 40 MG/ML IJ SUSP
40.0000 mg | Freq: Once | INTRAMUSCULAR | Status: AC
Start: 2015-09-11 — End: 2015-09-11
  Administered 2015-09-11: 40 mg

## 2015-09-11 MED ORDER — FENTANYL CITRATE (PF) 100 MCG/2ML IJ SOLN
INTRAMUSCULAR | Status: AC
  Administered 2015-09-11: 100 ug via INTRAVENOUS
  Filled 2015-09-11: qty 2

## 2015-09-11 MED ORDER — BUPIVACAINE HCL (PF) 0.25 % IJ SOLN
30.0000 mL | Freq: Once | INTRAMUSCULAR | Status: AC
  Administered 2015-09-11: 30 mL

## 2015-09-11 MED ORDER — SODIUM CHLORIDE 0.9 % IJ SOLN: 20.0000 mL | Freq: Once | INTRAMUSCULAR | Status: DC

## 2015-09-11 MED ORDER — HYDROCODONE-ACETAMINOPHEN 10-325 MG PO TABS: ORAL_TABLET | ORAL | Status: DC

## 2015-09-11 MED ORDER — CIPROFLOXACIN HCL 250 MG PO TABS
250.0000 mg | ORAL_TABLET | Freq: Two times a day (BID) | ORAL | Status: DC
  Administered 2016-04-08: 250 mg via ORAL
  Filled 2015-09-11 (×3): qty 1

## 2015-09-11 MED ORDER — MIDAZOLAM HCL 5 MG/5ML IJ SOLN
INTRAMUSCULAR | Status: AC
  Administered 2015-09-11: 3 mg via INTRAVENOUS
  Filled 2015-09-11: qty 5

## 2015-09-11 MED ORDER — TRIAMCINOLONE ACETONIDE 40 MG/ML IJ SUSP
INTRAMUSCULAR | Status: AC
  Administered 2015-09-11: 40 mg
  Filled 2015-09-11: qty 1

## 2015-09-11 MED ORDER — BUPIVACAINE HCL (PF) 0.25 % IJ SOLN
INTRAMUSCULAR | Status: AC
  Administered 2015-09-11: 30 mL
  Filled 2015-09-11: qty 30

## 2015-09-11 MED ORDER — FENTANYL CITRATE (PF) 100 MCG/2ML IJ SOLN
100.0000 ug | Freq: Once | INTRAMUSCULAR | Status: AC
  Administered 2015-09-11: 100 ug via INTRAVENOUS

## 2015-09-11 MED ORDER — ORPHENADRINE CITRATE 30 MG/ML IJ SOLN
INTRAMUSCULAR | Status: AC
  Filled 2015-09-11: qty 2

## 2015-09-11 NOTE — Patient Instructions (Addendum)
Continue present medication hydrocodone acetaminophen Please obtain antibiotic Cipro and begin taking Cipro today as prescribed  F/U PCP Dr. Lennox Grumbles for evaliation of  BP and general medical  condition  F/U surgical evaluation  F/U neurological evaluation  May consider radiofrequency rhizolysis or intraspinal procedures pending response to present treatment and F/U evaluation   Patient to call Pain Management Center should patient have concerns prior to scheduled return appointmen.   Pain Management Discharge Instructions  General Discharge Instructions :  If you need to reach your doctor call: Monday-Friday 8:00 am - 4:00 pm at [phone] or toll free [phone].  After clinic hours [phone] to have operator reach doctor.  Bring all of your medication bottles to all your appointments in the pain clinic.  To cancel or reschedule your appointment with Pain Management please remember to call 24 hours in advance to avoid a fee.  Refer to the educational materials which you have been given on: General Risks, I had my Procedure. Discharge Instructions, Post Sedation.  Post Procedure Instructions:  The drugs you were given will stay in your system until tomorrow, so for the next 24 hours you should not drive, make any legal decisions or drink any alcoholic beverages.  You may eat anything you prefer, but it is better to start with liquids then soups and crackers, and gradually work up to solid foods.  Please notify your doctor immediately if you have any unusual bleeding, trouble breathing or pain that is not related to your normal pain.  Depending on the type of procedure that was done, some parts of your body may feel week and/or numb.  This usually clears up by tonight or the next day.  Walk with the use of an assistive device or accompanied by an adult for the 24 hours.  You may use ice on the affected area for the first 24 hours.  Put ice in a Ziploc bag and cover with a  towel and place against area 15 minutes on 15 minutes off.  You may switch to heat after 24 hours.  A prescription for CIPRO was sent to your pharmacy and should be available for pickup today.

## 2015-09-11 NOTE — Progress Notes (Signed)
Safety precautions to be maintained throughout the outpatient stay will include: orient to surroundings, keep bed in low position, maintain call bell within reach at all times, provide assistance with transfer out of bed and ambulation.  

## 2015-09-11 NOTE — Progress Notes (Signed)
Subjective:    Patient ID: [name], female    DOB: [birth year], 49 y.o.   MRN: [id number]  HPI  PROCEDURE PERFORMED: Lumbar facet (medial branch block)   NOTE: The patient is a 49 y.o. female who returns to [place] for further evaluation and treatment of pain involving the lumbar and lower extremity region.  MRI  revealed the patient to be with evidence of  post operative changes at L4-5 with HNP at L5-S1.  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 We will proceed with interventional treatment as discussed and as explained to the patient who was understanding and wished to proceed with procedure as planned.   DESCRIPTION OF PROCEDURE: Lumbar facet (medial branch block) with IV Versed, IV fentanyl conscious sedation, EKG, blood pressure, pulse, and pulse oximetry monitoring. The procedure was performed with the patient in the prone position. Betadine prep of proposed entry site performed.   NEEDLE PLACEMENT AT:  left L  3 lumbar facet (medial branch block). Under fluoroscopic guidance with oblique orientation of 15 degrees, a 22-gauge needle was inserted at the L  3 vertebral body level with needle placed at the targeted area of Burton's Eye or Eye of the Scotty Dog with documentation of needle placement in the superior and lateral border of targeted area of Burton's Eye or Eye of the Scotty Dog with oblique orientation of 15 degrees. Following documentation of needle placement at the L  3 vertebral body level, needle placement was then accomplished at the L  4 vertebral body level.   NEEDLE PLACEMENT AT  L4 and L5 VERTEBRAL BODY LEVELS ON THE LEFT SIDE The procedure was  performed at the  L4 and L5 vertebral body levels exactly as was performed at the L  3 vertebral body level utilizing the same technique and under fluoroscopic guidance.  NEEDLE PLACEMENT AT THE SACRAL ALA with AP view of the lumbosacral spine. With the patient in the prone position, Betadine prep of proposed entry site accomplished, a 22 gauge needle was inserted in the region of the sacral ala (groove formed by the superior articulating process of S1 and the sacral wing). Following documentation of needle placement at the sacral ala,  needle placement was then accomplished at the S1 foramen level.   NEEDLE PLACEMENT AT THE S1 FORAMEN LEVEL under fluoroscopic guidance with AP view of the lumbosacral spine and cephalad orientation of the fluoroscope, a 22-gauge needle was placed at the superior and lateral border of the S1 foramen under fluoroscopic guidance. Following documentation of needle placement at the S1 foramen.   Needle placement was then verified at all levels on lateral view. Following documentation of needle placement at all levels on lateral view and following negative aspiration for heme and CSF, each level was injected with 1 mL of 0.25% bupivacaine with Kenalog.     LUMBAR FACET, MEDIAL BRANCH NERVE, BLOCKS PERFORMED ON THE RIGHT SIDE   The procedure was performed on the right side exactly as was performed on the left side at the same levels and utilizing the same technique under fluoroscopic guidance.     The patient tolerated the procedure well. A total of 40 mg of Kenalog was utilized for the procedure.  PLAN:  1. Medications: The patient will continue presently prescribed   Hydrocodone acetaminophen. 2. May consider modification of treatment regimen at time of return appointment pending response to treatment rendered on today's visit. 3. The patient is to follow-up with primary care physician   Dr. Lennox Grumbles for further evaluation of blood pressure and general medical  condition status post steroid injection performed on today's visit. 4. Surgical follow-up evaluation. May consider surgical evaluation as discussed 5. Neurological follow-up evaluation. May consider PNCV EMG studies and other studies 6. The patient may be candidate for radiofrequency procedures, implantation type procedures, and other treatment pending response to treatment and follow-up evaluation. 7. The patient has been advised to call the Pain Management Center prior to scheduled return appointment should there be significant change in condition or should patient have other concerns regarding condition prior to scheduled return appointment.  The patient is understanding and in agreement with suggested treatment plan.   Review of Systems     Objective:   Physical Exam        Assessment & Plan:

## 2015-09-12 ENCOUNTER — Telehealth: Payer: Self-pay

## 2015-09-12 NOTE — Telephone Encounter (Signed)
Left message

## 2015-09-14 ENCOUNTER — Encounter: Payer: Medicare Other | Admitting: Pain Medicine

## 2015-09-25 ENCOUNTER — Encounter (HOSPITAL_COMMUNITY): Payer: Medicare Other

## 2015-10-02 ENCOUNTER — Ambulatory Visit: Payer: Medicare Other | Admitting: Family

## 2015-10-05 ENCOUNTER — Encounter: Payer: Self-pay | Admitting: Pain Medicine

## 2015-10-05 ENCOUNTER — Ambulatory Visit: Payer: Medicare Other | Attending: Pain Medicine | Admitting: Pain Medicine

## 2015-10-05 VITALS — BP 160/90 | HR 104 | Temp 97.8°F | Resp 18 | Ht 66.0 in | Wt 162.0 lb

## 2015-10-05 DIAGNOSIS — M47816 Spondylosis without myelopathy or radiculopathy, lumbar region: Secondary | ICD-10-CM

## 2015-10-05 DIAGNOSIS — M79605 Pain in left leg: Secondary | ICD-10-CM | POA: Diagnosis present

## 2015-10-05 DIAGNOSIS — M5481 Occipital neuralgia: Secondary | ICD-10-CM

## 2015-10-05 DIAGNOSIS — M5137 Other intervertebral disc degeneration, lumbosacral region: Secondary | ICD-10-CM

## 2015-10-05 DIAGNOSIS — M51379 Other intervertebral disc degeneration, lumbosacral region without mention of lumbar back pain or lower extremity pain: Secondary | ICD-10-CM

## 2015-10-05 DIAGNOSIS — M5136 Other intervertebral disc degeneration, lumbar region: Secondary | ICD-10-CM | POA: Insufficient documentation

## 2015-10-05 DIAGNOSIS — I739 Peripheral vascular disease, unspecified: Secondary | ICD-10-CM | POA: Diagnosis not present

## 2015-10-05 DIAGNOSIS — M5030 Other cervical disc degeneration, unspecified cervical region: Secondary | ICD-10-CM

## 2015-10-05 DIAGNOSIS — M533 Sacrococcygeal disorders, not elsewhere classified: Secondary | ICD-10-CM | POA: Diagnosis not present

## 2015-10-05 DIAGNOSIS — M25559 Pain in unspecified hip: Secondary | ICD-10-CM

## 2015-10-05 DIAGNOSIS — M545 Low back pain: Secondary | ICD-10-CM | POA: Diagnosis present

## 2015-10-05 DIAGNOSIS — M79604 Pain in right leg: Secondary | ICD-10-CM | POA: Diagnosis present

## 2015-10-05 MED ORDER — HYDROCODONE-ACETAMINOPHEN 10-325 MG PO TABS: ORAL_TABLET | ORAL | Status: DC

## 2015-10-05 NOTE — Patient Instructions (Addendum)
PLAN   .Epidural Steroid Injection An epidural steroid injection is given to relieve pain in your neck, back, or legs that is caused by the irritation or swelling of a nerve root. This procedure involves injecting a steroid and numbing medicine (anesthetic) into the epidural space. The epidural space is the space between the outer covering of your spinal cord and the bones that form your backbone (vertebra).  LET [facility] CARE PROVIDER KNOW ABOUT:   Any allergies you have.  All medicines you are taking, including vitamins, herbs, eye drops, creams, and over-the-counter medicines such as aspirin.  Previous problems you or members of your family have had with the use of anesthetics.  Any blood disorders or blood clotting disorders you have.  Previous surgeries you have had.  Medical conditions you have. RISKS AND COMPLICATIONS Generally, this is a safe procedure. However, as with any procedure, complications can occur. Possible complications of epidural steroid injection include:  Headache.  Bleeding.  Infection.  Allergic reaction to the medicines.  Damage to your nerves. The response to this procedure depends on the underlying cause of the pain and its duration. People who have long-term (chronic) pain are less likely to benefit from epidural steroids than are those people whose pain comes on strong and suddenly. BEFORE THE PROCEDURE   Ask your health care provider about changing or stopping your regular medicines. You may be advised to stop taking blood-thinning medicines a few days before the procedure.  You may be given medicines to reduce anxiety.  Arrange for someone to take you home after the procedure. PROCEDURE   You will remain awake during the procedure. You may receive medicine to make you relaxed.  You will be asked to lie on your stomach.  The injection site will be cleaned.  The injection site will be numbed with a medicine (local anesthetic).  A  needle will be injected through your skin into the epidural space.  Your health care provider will use an X-ray machine to ensure that the steroid is delivered closest to the affected nerve. You may have minimal discomfort at this time.  Once the needle is in the right position, the local anesthetic and the steroid will be injected into the epidural space.  The needle will then be removed and a bandage will be applied to the injection site. AFTER THE PROCEDURE   You may be monitored for a short time before you go home.  You may feel weakness or numbness in your arm or leg, which disappears within hours.  You may be allowed to eat, drink, and take your regular medicine.  You may have soreness at the site of the injection.   This information is not intended to replace advice given to you by your health care provider. Make sure you discuss any questions you have with your health care provider.   Document Released: 02/11/2008 Document Revised: 07/07/2013 Document Reviewed: 04/23/2013 Elsevier Interactive Patient Education 2016 [place]. Pain Management Discharge Instructions  General Discharge Instructions :  If you need to reach your doctor call: Monday-Friday 8:00 am - 4:00 pm at [phone] or toll free [phone].  After clinic hours [phone] to have operator reach doctor.  Bring all of your medication bottles to all your appointments in the pain clinic.  To cancel or reschedule your appointment with Pain Management please remember to call 24 hours in advance to avoid a fee.  Refer to the educational materials which you have been given on: General Risks, I  had my Procedure. Discharge Instructions, Post Sedation.  Post Procedure Instructions:  The drugs you were given will stay in your system until tomorrow, so for the next 24 hours you should not drive, make any legal decisions or drink any alcoholic beverages.  You may eat anything you prefer, but it is better to  start with liquids then soups and crackers, and gradually work up to solid foods.  Please notify your doctor immediately if you have any unusual bleeding, trouble breathing or pain that is not related to your normal pain.  Depending on the type of procedure that was done, some parts of your body may feel week and/or numb.  This usually clears up by tonight or the next day.  Walk with the use of an assistive device or accompanied by an adult for the 24 hours.  You may use ice on the affected area for the first 24 hours.  Put ice in a Ziploc bag and cover with a towel and place against area 15 minutes on 15 minutes off.  You may switch to heat after 24 hours.GENERAL RISKS AND COMPLICATIONS  What are the risk, side effects and possible complications? Generally speaking, most procedures are safe.  However, with any procedure there are risks, side effects, and the possibility of complications.  The risks and complications are dependent upon the sites that are lesioned, or the type of nerve block to be performed.  The closer the procedure is to the spine, the more serious the risks are.  Great care is taken when placing the radio frequency needles, block needles or lesioning probes, but sometimes complications can occur.  Infection: Any time there is an injection through the skin, there is a risk of infection.  This is why sterile conditions are used for these blocks.  There are four possible types of infection.  Localized skin infection.  Central Nervous System Infection-This can be in the form of Meningitis, which can be deadly.  Epidural Infections-This can be in the form of an epidural abscess, which can cause pressure inside of the spine, causing compression of the spinal cord with subsequent paralysis. This would require an emergency surgery to decompress, and there are no guarantees that the patient would recover from the paralysis.  Discitis-This is an infection of the intervertebral discs.   It occurs in about 1% of discography procedures.  It is difficult to treat and it may lead to surgery.        2. Pain: the needles have to go through skin and soft tissues, will cause soreness.       3. Damage to internal structures:  The nerves to be lesioned may be near blood vessels or    other nerves which can be potentially damaged.       4. Bleeding: Bleeding is more common if the patient is taking blood thinners such as  aspirin, Coumadin, Ticiid, Plavix, etc., or if he/she have some genetic predisposition  such as hemophilia. Bleeding into the spinal canal can cause compression of the spinal  cord with subsequent paralysis.  This would require an emergency surgery to  decompress and there are no guarantees that the patient would recover from the  paralysis.       5. Pneumothorax:  Puncturing of a lung is a possibility, every time a needle is introduced in  the area of the chest or upper back.  Pneumothorax refers to free air around the  collapsed lung(s), inside of the thoracic cavity (chest cavity).  Another two possible  complications related to a similar event would include: Hemothorax and Chylothorax.   These are variations of the Pneumothorax, where instead of air around the collapsed  lung(s), you may have blood or chyle, respectively.       6. Spinal headaches: They may occur with any procedures in the area of the spine.       7. Persistent CSF (Cerebro-Spinal Fluid) leakage: This is a rare problem, but may occur  with prolonged intrathecal or epidural catheters either due to the formation of a fistulous  track or a dural tear.       8. Nerve damage: By working so close to the spinal cord, there is always a possibility of  nerve damage, which could be as serious as a permanent spinal cord injury with  paralysis.       9. Death:  Although rare, severe deadly allergic reactions known as "Anaphylactic  reaction" can occur to any of the medications used.      10. Worsening of the symptoms:  We  can always make thing worse.  What are the chances of something like this happening? Chances of any of this occuring are extremely low.  By statistics, you have more of a chance of getting killed in a motor vehicle accident: while driving to the hospital than any of the above occurring .  Nevertheless, you should be aware that they are possibilities.  In general, it is similar to taking a shower.  Everybody knows that you can slip, hit your head and get killed.  Does that mean that you should not shower again?  Nevertheless always keep in mind that statistics do not mean anything if you happen to be on the wrong side of them.  Even if a procedure has a 1 (one) in a 1,000,000 (million) chance of going wrong, it you happen to be that one..Also, keep in mind that by statistics, you have more of a chance of having something go wrong when taking medications.  Who should not have this procedure? If you are on a blood thinning medication (e.g. Coumadin, Plavix, see list of "Blood Thinners"), or if you have an active infection going on, you should not have the procedure.  If you are taking any blood thinners, please inform your physician.  How should I prepare for this procedure?  Do not eat or drink anything at least six hours prior to the procedure.  Bring a driver with you .  It cannot be a taxi.  Come accompanied by an adult that can drive you back, and that is strong enough to help you if your legs get weak or numb from the local anesthetic.  Take all of your medicines the morning of the procedure with just enough water to swallow them.  If you have diabetes, make sure that you are scheduled to have your procedure done first thing in the morning, whenever possible.  If you have diabetes, take only half of your insulin dose and notify our nurse that you have done so as soon as you arrive at the clinic.  If you are diabetic, but only take blood sugar pills (oral hypoglycemic), then do not take them  on the morning of your procedure.  You may take them after you have had the procedure.  Do not take aspirin or any aspirin-containing medications, at least eleven (11) days prior to the procedure.  They may prolong bleeding.  Wear loose fitting clothing that may be easy to  take off and that you would not mind if it got stained with Betadine or blood.  Do not wear any jewelry or perfume  Remove any nail coloring.  It will interfere with some of our monitoring equipment.  NOTE: Remember that this is not meant to be interpreted as a complete list of all possible complications.  Unforeseen problems may occur.  BLOOD THINNERS The following drugs contain aspirin or other products, which can cause increased bleeding during surgery and should not be taken for 2 weeks prior to and 1 week after surgery.  If you should need take something for relief of minor pain, you may take acetaminophen which is found in Tylenol,m Datril, Anacin-3 and Panadol. It is not blood thinner. The products listed below are.  Do not take any of the products listed below in addition to any listed on your instruction sheet.  A.P.C or A.P.C with Codeine Codeine Phosphate Capsules #3 Ibuprofen Ridaura  ABC compound Congesprin Imuran rimadil  Advil Cope Indocin Robaxisal  Alka-Seltzer Effervescent Pain Reliever and Antacid Coricidin or Coricidin-D  Indomethacin Rufen  Alka-Seltzer plus Cold Medicine Cosprin Ketoprofen S-A-C Tablets  Anacin Analgesic Tablets or Capsules Coumadin Korlgesic Salflex  Anacin Extra Strength Analgesic tablets or capsules CP-2 Tablets Lanoril Salicylate  Anaprox Cuprimine Capsules Levenox Salocol  Anexsia-D Dalteparin Magan Salsalate  Anodynos Darvon compound Magnesium Salicylate Sine-off  Ansaid Dasin Capsules Magsal Sodium Salicylate  Anturane Depen Capsules Marnal Soma  APF Arthritis pain formula Dewitt's Pills Measurin Stanback  Argesic Dia-Gesic Meclofenamic Sulfinpyrazone  Arthritis Bayer  Timed Release Aspirin Diclofenac Meclomen Sulindac  Arthritis pain formula Anacin Dicumarol Medipren Supac  Analgesic (Safety coated) Arthralgen Diffunasal Mefanamic Suprofen  Arthritis Strength Bufferin Dihydrocodeine Mepro Compound Suprol  Arthropan liquid Dopirydamole Methcarbomol with Aspirin Synalgos  ASA tablets/Enseals Disalcid Micrainin Tagament  Ascriptin Doan's Midol Talwin  Ascriptin A/D Dolene Mobidin Tanderil  Ascriptin Extra Strength Dolobid Moblgesic Ticlid  Ascriptin with Codeine Doloprin or Doloprin with Codeine Momentum Tolectin  Asperbuf Duoprin Mono-gesic Trendar  Aspergum Duradyne Motrin or Motrin IB Triminicin  Aspirin plain, buffered or enteric coated Durasal Myochrisine Trigesic  Aspirin Suppositories Easprin Nalfon Trillsate  Aspirin with Codeine Ecotrin Regular or Extra Strength Naprosyn Uracel  Atromid-S Efficin Naproxen Ursinus  Auranofin Capsules Elmiron Neocylate Vanquish  Axotal Emagrin Norgesic Verin  Azathioprine Empirin or Empirin with Codeine Normiflo Vitamin E  Azolid Emprazil Nuprin Voltaren  Bayer Aspirin plain, buffered or children's or timed BC Tablets or powders Encaprin Orgaran Warfarin Sodium  Buff-a-Comp Enoxaparin Orudis Zorpin  Buff-a-Comp with Codeine Equegesic Os-Cal-Gesic   Buffaprin Excedrin plain, buffered or Extra Strength Oxalid   Bufferin Arthritis Strength Feldene Oxphenbutazone   Bufferin plain or Extra Strength Feldene Capsules Oxycodone with Aspirin   Bufferin with Codeine Fenoprofen Fenoprofen Pabalate or Pabalate-SF   Buffets II Flogesic Panagesic   Buffinol plain or Extra Strength Florinal or Florinal with Codeine Panwarfarin   Buf-Tabs Flurbiprofen Penicillamine   Butalbital Compound Four-way cold tablets Penicillin   Butazolidin Fragmin Pepto-Bismol   Carbenicillin Geminisyn Percodan   Carna Arthritis Reliever Geopen Persantine   Carprofen Gold's salt Persistin   Chloramphenicol Goody's Phenylbutazone   Chloromycetin  [place]rain Piroxlcam   Clmetidine heparin Plaquenil   Cllnoril Hyco-pap Ponstel   Clofibrate Hydroxy chloroquine Propoxyphen         Before stopping any of these medications, be sure to consult the physician who ordered them.  Some, such as Coumadin (Warfarin) are ordered to prevent or treat serious conditions such as "deep thrombosis", "pumonary  embolisms", and other heart problems.  The amount of time that you may need off of the medication may also vary with the medication and the reason for which you were taking it.  If you are taking any of these medications, please make sure you notify your pain physician before you undergo any procedures.

## 2015-10-05 NOTE — Progress Notes (Signed)
Safety precautions to be maintained throughout the outpatient stay will include: orient to surroundings, keep bed in low position, maintain call bell within reach at all times, provide assistance with transfer out of bed and ambulation.  

## 2015-10-05 NOTE — Progress Notes (Signed)
   Subjective:    Patient ID: [name], female    DOB: 08/08/1966, 49 y.o.   MRN: [id number]  HPI  The patient is a 49 year old female who returns to pain management for further evaluation and treatment of pain involving the lower back and lower extremity region predominantly. The patient states the pain is associated with lower extremity weakness which increases as patient spends more time on the feet. Patient denies trauma or change in events of daily living to cause change in symptomatology. Patient has had improvement of headaches and pain involving the cervical region. We discussed patient's condition patient is continuing to use back brace on a limited basis and also wishes to have TENS unit. We will prescribe TENS unit for patient and will proceed with lumbar epidural steroid injection to be performed at time of return appointment. The patient agreed to suggested treatment plan       Review of Systems     Objective:   Physical Exam  There was tenderness to palpation over the splenius capitis and occipitalis musculature regions of mild degree. There was mild tenderness to palpation over the region of the cervical facet cervical paraspinal musculature region as well as the thoracic facet thoracic paraspinal musculature region. There was mild tenderness to palpation of the acromioclavicular and glenohumeral joint regions. There appeared to be with slightly decreased grip strength and Tinel and Phalen's maneuver were without increased pain of significant degree. Palpation over the lumbar paraspinal muscles lumbar facet region was attends to palpation of moderate to moderately severe degree with lateral bending and rotation extension and palpation of the lumbar facets reproduced moderately severe discomfort. Straight leg raising was tolerates approximately 30 without increased pain with dorsiflexion noted. There was decreased EHL strength without a definite sensory deficit of dermatomal  distribution detected DTRs were difficult to elicit Patient did have difficulty relaxing there was negative clonus negative Homans The abdomen was nontender and no costovertebral angle tenderness was noted         Assessment & Plan:  Degenerative disc disease of the cervical spine  Cervical facet syndrome  Degenerative disc disease lumbar spine  Lumbar facet syndrome  Peripherovascular disease of the lower extremities  Sacroiliitis sacroiliac joint dysfunction    PLAN    Continue present medication hydrocodone acetaminophen   Lumbar epidural steroid injection to be performed at time of return appointment  F/U PCP Dr. Lennox Grumbles for evaliation of  BP and general medical  condition  F/U surgical evaluation. May consider pending follow-up evaluations  TENS unit prescribed for lumbar and lower extremity pain and patient will use back brace on a limited basis as discussed  F/U neurological evaluation. May consider pending follow-up evaluations  May consider radiofrequency rhizolysis or intraspinal procedures pending response to present treatment and F/U evaluation  Patient to call Pain Management Center should patient have concerns prior to scheduled return appointment

## 2015-10-10 ENCOUNTER — Encounter: Payer: Medicare Other | Admitting: Pain Medicine

## 2015-10-23 ENCOUNTER — Ambulatory Visit: Payer: Medicare Other | Admitting: Pain Medicine

## 2015-11-06 ENCOUNTER — Ambulatory Visit: Payer: Medicare Other | Attending: Pain Medicine | Admitting: Pain Medicine

## 2015-11-06 ENCOUNTER — Encounter: Payer: Self-pay | Admitting: Pain Medicine

## 2015-11-06 DIAGNOSIS — M25559 Pain in unspecified hip: Secondary | ICD-10-CM

## 2015-11-06 DIAGNOSIS — M5126 Other intervertebral disc displacement, lumbar region: Secondary | ICD-10-CM | POA: Diagnosis not present

## 2015-11-06 DIAGNOSIS — M79604 Pain in right leg: Secondary | ICD-10-CM | POA: Diagnosis present

## 2015-11-06 DIAGNOSIS — M79605 Pain in left leg: Secondary | ICD-10-CM | POA: Diagnosis present

## 2015-11-06 DIAGNOSIS — M5137 Other intervertebral disc degeneration, lumbosacral region: Secondary | ICD-10-CM

## 2015-11-06 DIAGNOSIS — M47816 Spondylosis without myelopathy or radiculopathy, lumbar region: Secondary | ICD-10-CM | POA: Diagnosis not present

## 2015-11-06 DIAGNOSIS — Z9889 Other specified postprocedural states: Secondary | ICD-10-CM | POA: Diagnosis not present

## 2015-11-06 DIAGNOSIS — M545 Low back pain: Secondary | ICD-10-CM | POA: Diagnosis present

## 2015-11-06 DIAGNOSIS — M51379 Other intervertebral disc degeneration, lumbosacral region without mention of lumbar back pain or lower extremity pain: Secondary | ICD-10-CM

## 2015-11-06 DIAGNOSIS — M533 Sacrococcygeal disorders, not elsewhere classified: Secondary | ICD-10-CM

## 2015-11-06 DIAGNOSIS — M5030 Other cervical disc degeneration, unspecified cervical region: Secondary | ICD-10-CM

## 2015-11-06 DIAGNOSIS — M5481 Occipital neuralgia: Secondary | ICD-10-CM

## 2015-11-06 DIAGNOSIS — I739 Peripheral vascular disease, unspecified: Secondary | ICD-10-CM

## 2015-11-06 MED ORDER — LIDOCAINE HCL (PF) 1 % IJ SOLN: 10.0000 mL | Freq: Once | INTRAMUSCULAR | Status: DC

## 2015-11-06 MED ORDER — ORPHENADRINE CITRATE 30 MG/ML IJ SOLN
INTRAMUSCULAR | Status: AC
  Administered 2015-11-06: 12:00:00
  Filled 2015-11-06: qty 2

## 2015-11-06 MED ORDER — LIDOCAINE HCL (PF) 1 % IJ SOLN
INTRAMUSCULAR | Status: AC
  Administered 2015-11-06: 12:00:00
  Filled 2015-11-06: qty 5

## 2015-11-06 MED ORDER — FENTANYL CITRATE (PF) 100 MCG/2ML IJ SOLN: 100.0000 ug | Freq: Once | INTRAMUSCULAR | Status: DC

## 2015-11-06 MED ORDER — MIDAZOLAM HCL 5 MG/5ML IJ SOLN
5.0000 mg | Freq: Once | INTRAMUSCULAR | Status: DC
Start: 2015-11-06 — End: 2016-05-07

## 2015-11-06 MED ORDER — ORPHENADRINE CITRATE 30 MG/ML IJ SOLN: 60.0000 mg | Freq: Once | INTRAMUSCULAR | Status: DC

## 2015-11-06 MED ORDER — SODIUM CHLORIDE 0.9 % IJ SOLN: 20.0000 mL | Freq: Once | INTRAMUSCULAR | Status: DC

## 2015-11-06 MED ORDER — CIPROFLOXACIN HCL 250 MG PO TABS: 250.0000 mg | ORAL_TABLET | Freq: Two times a day (BID) | ORAL | Status: DC

## 2015-11-06 MED ORDER — SODIUM CHLORIDE 0.9 % IJ SOLN
INTRAMUSCULAR | Status: AC
  Administered 2015-11-06: 12:00:00
  Filled 2015-11-06: qty 10

## 2015-11-06 MED ORDER — TRIAMCINOLONE ACETONIDE 40 MG/ML IJ SUSP
INTRAMUSCULAR | Status: AC
  Administered 2015-11-06: 12:00:00
  Filled 2015-11-06: qty 1

## 2015-11-06 MED ORDER — HYDROCODONE-ACETAMINOPHEN 10-325 MG PO TABS: ORAL_TABLET | ORAL | Status: DC

## 2015-11-06 MED ORDER — FENTANYL CITRATE (PF) 100 MCG/2ML IJ SOLN
INTRAMUSCULAR | Status: AC
  Administered 2015-11-06: 100 ug via INTRAVENOUS
  Filled 2015-11-06: qty 2

## 2015-11-06 MED ORDER — TRIAMCINOLONE ACETONIDE 40 MG/ML IJ SUSP: 40.0000 mg | Freq: Once | INTRAMUSCULAR | Status: DC

## 2015-11-06 MED ORDER — LACTATED RINGERS IV SOLN: 1000.0000 mL | INTRAVENOUS | Status: DC

## 2015-11-06 MED ORDER — CIPROFLOXACIN IN D5W 400 MG/200ML IV SOLN: 400.0000 mg | Freq: Once | INTRAVENOUS | Status: DC

## 2015-11-06 MED ORDER — CIPROFLOXACIN IN D5W 400 MG/200ML IV SOLN
INTRAVENOUS | Status: AC
  Administered 2015-11-06: 12:00:00 via INTRAVENOUS
  Filled 2015-11-06: qty 200

## 2015-11-06 MED ORDER — BUPIVACAINE HCL (PF) 0.25 % IJ SOLN
INTRAMUSCULAR | Status: AC
  Administered 2015-11-06: 12:00:00
  Filled 2015-11-06: qty 30

## 2015-11-06 MED ORDER — MIDAZOLAM HCL 5 MG/5ML IJ SOLN
INTRAMUSCULAR | Status: AC
  Administered 2015-11-06: 5 mg via INTRAVENOUS
  Filled 2015-11-06: qty 5

## 2015-11-06 NOTE — Progress Notes (Signed)
   Subjective:    Patient ID: [name], female    DOB: Jan 10, 1966, 49 y.o.   MRN: [id number]  HPI PROCEDURE PERFORMED: Lumbar epidural steroid injection   NOTE: The patient is a 49 y.o. female who returns to [place] for further evaluation and treatment of pain involving the lumbar and lower extremity region. 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 There is concern regarding intraspinal abnormalities contributing to patient's symptomatology with concern regarding lumbar radiculopathy. The risks, benefits, and expectations of the procedure have been discussed and explained to the patient who was understanding and in agreement with suggested treatment plan. We will proceed with lumbar epidural steroid injection as discussed and as explained to the patient who is willing to proceed with procedure as planned.   DESCRIPTION OF PROCEDURE: Lumbar epidural steroid injection with IV Versed, IV fentanyl conscious sedation, EKG, blood pressure, pulse, and pulse oximetry monitoring. The procedure was performed with the patient in the prone position under fluoroscopic guidance. A local anesthetic skin wheal of 1.5% plain lidocaine was accomplished at proposed entry site. An 18-gauge Tuohy epidural needle was inserted at the L 5 vertebral body level right of the midline via loss-of-resistance technique with negative heme and negative CSF return. A total of 4 mL of Preservative-Free normal saline with 40 mg of Kenalog injected incrementally via epidurally placed needle. Needle was removed.    A total of 40 mg of Kenalog was utilized for the procedure.   The patient tolerated the injection well.    PLAN:   1. Medications: We will continue presently prescribed medication hydrocodone acetaminophen. 2. Will consider modification of treatment regimen  pending response to treatment rendered on today's visit and follow-up evaluation. 3. The patient is to follow-up with primary care physician  Dr. Lennox Grumbles regarding blood pressure and general medical condition status post lumbar epidural steroid injection performed on today's visit. 4. Surgical evaluation. Has been addressed 5. Neurological evaluation. May consider [facility] EMG studies 6. The patient may be a candidate for radiofrequency procedures, implantation device, and other treatment pending response to treatment and follow-up evaluation. 7. The patient has been advised to adhere to proper body mechanics and avoid activities which appear to aggravate condition. 8. The patient has been advised to call the [facility] prior to scheduled return appointment should there be significant change in condition or should there be sign  The patient is understanding and agrees with the suggested  treatment plan   Review of Systems     Objective:   Physical Exam        Assessment & Plan:

## 2015-11-06 NOTE — Progress Notes (Signed)
Safety precautions to be maintained throughout the outpatient stay will include: orient to surroundings, keep bed in low position, maintain call bell within reach at all times, provide assistance with transfer out of bed and ambulation.  

## 2015-11-06 NOTE — Patient Instructions (Addendum)
Continue present medication hydrocodone acetaminophen and begin taking antibiotic Cipro today as prescribed  F/U PCP Dr. Lennox Grumbles for evaliation of  BP and general medical  condition  F/U surgical evaluation  F/U neurological evaluation. Neurological evaluation of headaches as discussed   May consider radiofrequency rhizolysis or intraspinal procedures pending response to present treatment and F/U evaluation   Patient to call Pain Management Center should patient have concerns prior to scheduled return appointmen.Pain Management Discharge Instructions  General Discharge Instructions :  If you need to reach your doctor call: Monday-Friday 8:00 am - 4:00 pm at [phone] or toll free [phone].  After clinic hours [phone] to have operator reach doctor.  Bring all of your medication bottles to all your appointments in the pain clinic.  To cancel or reschedule your appointment with Pain Management please remember to call 24 hours in advance to avoid a fee.  Refer to the educational materials which you have been given on: General Risks, I had my Procedure. Discharge Instructions, Post Sedation.  Post Procedure Instructions:  The drugs you were given will stay in your system until tomorrow, so for the next 24 hours you should not drive, make any legal decisions or drink any alcoholic beverages.  You may eat anything you prefer, but it is better to start with liquids then soups and crackers, and gradually work up to solid foods.  Please notify your doctor immediately if you have any unusual bleeding, trouble breathing or pain that is not related to your normal pain.  Depending on the type of procedure that was done, some parts of your body may feel week and/or numb.  This usually clears up by tonight or the next day.  Walk with the use of an assistive device or accompanied by an adult for the 24 hours.  You may use ice on the affected area for the first 24 hours.  Put ice in a  Ziploc bag and cover with a towel and place against area 15 minutes on 15 minutes off.  You may switch to heat after 24 hours.GENERAL RISKS AND COMPLICATIONS  What are the risk, side effects and possible complications? Generally speaking, most procedures are safe.  However, with any procedure there are risks, side effects, and the possibility of complications.  The risks and complications are dependent upon the sites that are lesioned, or the type of nerve block to be performed.  The closer the procedure is to the spine, the more serious the risks are.  Great care is taken when placing the radio frequency needles, block needles or lesioning probes, but sometimes complications can occur. 1. Infection: Any time there is an injection through the skin, there is a risk of infection.  This is why sterile conditions are used for these blocks.  There are four possible types of infection. 1. Localized skin infection. 2. Central Nervous System Infection-This can be in the form of Meningitis, which can be deadly. 3. Epidural Infections-This can be in the form of an epidural abscess, which can cause pressure inside of the spine, causing compression of the spinal cord with subsequent paralysis. This would require an emergency surgery to decompress, and there are no guarantees that the patient would recover from the paralysis. 4. Discitis-This is an infection of the intervertebral discs.  It occurs in about 1% of discography procedures.  It is difficult to treat and it may lead to surgery.        2. Pain: the needles have to go through skin and  soft tissues, will cause soreness.       3. Damage to internal structures:  The nerves to be lesioned may be near blood vessels or    other nerves which can be potentially damaged.       4. Bleeding: Bleeding is more common if the patient is taking blood thinners such as  aspirin, Coumadin, Ticiid, Plavix, etc., or if he/she have some genetic predisposition  such as  hemophilia. Bleeding into the spinal canal can cause compression of the spinal  cord with subsequent paralysis.  This would require an emergency surgery to  decompress and there are no guarantees that the patient would recover from the  paralysis.       5. Pneumothorax:  Puncturing of a lung is a possibility, every time a needle is introduced in  the area of the chest or upper back.  Pneumothorax refers to free air around the  collapsed lung(s), inside of the thoracic cavity (chest cavity).  Another two possible  complications related to a similar event would include: Hemothorax and Chylothorax.   These are variations of the Pneumothorax, where instead of air around the collapsed  lung(s), you may have blood or chyle, respectively.       6. Spinal headaches: They may occur with any procedures in the area of the spine.       7. Persistent CSF (Cerebro-Spinal Fluid) leakage: This is a rare problem, but may occur  with prolonged intrathecal or epidural catheters either due to the formation of a fistulous  track or a dural tear.       8. Nerve damage: By working so close to the spinal cord, there is always a possibility of  nerve damage, which could be as serious as a permanent spinal cord injury with  paralysis.       9. Death:  Although rare, severe deadly allergic reactions known as "Anaphylactic  reaction" can occur to any of the medications used.      10. Worsening of the symptoms:  We can always make thing worse.  What are the chances of something like this happening? Chances of any of this occuring are extremely low.  By statistics, you have more of a chance of getting killed in a motor vehicle accident: while driving to the hospital than any of the above occurring .  Nevertheless, you should be aware that they are possibilities.  In general, it is similar to taking a shower.  Everybody knows that you can slip, hit your head and get killed.  Does that mean that you should not shower again?  Nevertheless  always keep in mind that statistics do not mean anything if you happen to be on the wrong side of them.  Even if a procedure has a 1 (one) in a 1,000,000 (million) chance of going wrong, it you happen to be that one..Also, keep in mind that by statistics, you have more of a chance of having something go wrong when taking medications.  Who should not have this procedure? If you are on a blood thinning medication (e.g. Coumadin, Plavix, see list of "Blood Thinners"), or if you have an active infection going on, you should not have the procedure.  If you are taking any blood thinners, please inform your physician.  How should I prepare for this procedure?  Do not eat or drink anything at least six hours prior to the procedure.  Bring a driver with you .  It cannot be a taxi.  Come accompanied by an adult that can drive you back, and that is strong enough to help you if your legs get weak or numb from the local anesthetic.  Take all of your medicines the morning of the procedure with just enough water to swallow them.  If you have diabetes, make sure that you are scheduled to have your procedure done first thing in the morning, whenever possible.  If you have diabetes, take only half of your insulin dose and notify our nurse that you have done so as soon as you arrive at the clinic.  If you are diabetic, but only take blood sugar pills (oral hypoglycemic), then do not take them on the morning of your procedure.  You may take them after you have had the procedure.  Do not take aspirin or any aspirin-containing medications, at least eleven (11) days prior to the procedure.  They may prolong bleeding.  Wear loose fitting clothing that may be easy to take off and that you would not mind if it got stained with Betadine or blood.  Do not wear any jewelry or perfume  Remove any nail coloring.  It will interfere with some of our monitoring equipment.  NOTE: Remember that this is not meant to be  interpreted as a complete list of all possible complications.  Unforeseen problems may occur.  BLOOD THINNERS The following drugs contain aspirin or other products, which can cause increased bleeding during surgery and should not be taken for 2 weeks prior to and 1 week after surgery.  If you should need take something for relief of minor pain, you may take acetaminophen which is found in Tylenol,m Datril, Anacin-3 and Panadol. It is not blood thinner. The products listed below are.  Do not take any of the products listed below in addition to any listed on your instruction sheet.  A.P.C or A.P.C with Codeine Codeine Phosphate Capsules #3 Ibuprofen Ridaura  ABC compound Congesprin Imuran rimadil  Advil Cope Indocin Robaxisal  Alka-Seltzer Effervescent Pain Reliever and Antacid Coricidin or Coricidin-D  Indomethacin Rufen  Alka-Seltzer plus Cold Medicine Cosprin Ketoprofen S-A-C Tablets  Anacin Analgesic Tablets or Capsules Coumadin Korlgesic Salflex  Anacin Extra Strength Analgesic tablets or capsules CP-2 Tablets Lanoril Salicylate  Anaprox Cuprimine Capsules Levenox Salocol  Anexsia-D Dalteparin Magan Salsalate  Anodynos Darvon compound Magnesium Salicylate Sine-off  Ansaid Dasin Capsules Magsal Sodium Salicylate  Anturane Depen Capsules Marnal Soma  APF Arthritis pain formula Dewitt's Pills Measurin Stanback  Argesic Dia-Gesic Meclofenamic Sulfinpyrazone  Arthritis Bayer Timed Release Aspirin Diclofenac Meclomen Sulindac  Arthritis pain formula Anacin Dicumarol Medipren Supac  Analgesic (Safety coated) Arthralgen Diffunasal Mefanamic Suprofen  Arthritis Strength Bufferin Dihydrocodeine Mepro Compound Suprol  Arthropan liquid Dopirydamole Methcarbomol with Aspirin Synalgos  ASA tablets/Enseals Disalcid Micrainin Tagament  Ascriptin Doan's Midol Talwin  Ascriptin A/D Dolene Mobidin Tanderil  Ascriptin Extra Strength Dolobid Moblgesic Ticlid  Ascriptin with Codeine Doloprin or Doloprin  with Codeine Momentum Tolectin  Asperbuf Duoprin Mono-gesic Trendar  Aspergum Duradyne Motrin or Motrin IB Triminicin  Aspirin plain, buffered or enteric coated Durasal Myochrisine Trigesic  Aspirin Suppositories Easprin Nalfon Trillsate  Aspirin with Codeine Ecotrin Regular or Extra Strength Naprosyn Uracel  Atromid-S Efficin Naproxen Ursinus  Auranofin Capsules Elmiron Neocylate Vanquish  Axotal Emagrin Norgesic Verin  Azathioprine Empirin or Empirin with Codeine Normiflo Vitamin E  Azolid Emprazil Nuprin Voltaren  Bayer Aspirin plain, buffered or children's or timed BC Tablets or powders Encaprin Orgaran Warfarin Sodium  Buff-a-Comp Enoxaparin Orudis Zorpin  Buff-a-Comp with Codeine Equegesic Os-Cal-Gesic   Buffaprin Excedrin plain, buffered or Extra Strength Oxalid   Bufferin Arthritis Strength Feldene Oxphenbutazone   Bufferin plain or Extra Strength Feldene Capsules Oxycodone with Aspirin   Bufferin with Codeine Fenoprofen Fenoprofen Pabalate or Pabalate-SF   Buffets II Flogesic Panagesic   Buffinol plain or Extra Strength Florinal or Florinal with Codeine Panwarfarin   Buf-Tabs Flurbiprofen Penicillamine   Butalbital Compound Four-way cold tablets Penicillin   Butazolidin Fragmin Pepto-Bismol   Carbenicillin Geminisyn Percodan   Carna Arthritis Reliever Geopen Persantine   Carprofen Gold's salt Persistin   Chloramphenicol Goody's Phenylbutazone   Chloromycetin Haltrain Piroxlcam   Clmetidine heparin Plaquenil   Cllnoril Hyco-pap Ponstel   Clofibrate Hydroxy chloroquine Propoxyphen         Before stopping any of these medications, be sure to consult the physician who ordered them.  Some, such as Coumadin (Warfarin) are ordered to prevent or treat serious conditions such as "deep thrombosis", "pumonary embolisms", and other heart problems.  The amount of time that you may need off of the medication may also vary with the medication and the reason for which you were taking it.   If you are taking any of these medications, please make sure you notify your pain physician before you undergo any procedures.  Hydrocodone prescription given

## 2015-11-07 ENCOUNTER — Ambulatory Visit: Payer: Medicare Other | Admitting: Pain Medicine

## 2015-11-07 ENCOUNTER — Telehealth: Payer: Self-pay

## 2015-11-07 NOTE — Telephone Encounter (Signed)
Denies any needs or complications

## 2015-11-14 ENCOUNTER — Encounter: Payer: Self-pay | Admitting: Family

## 2015-11-22 ENCOUNTER — Ambulatory Visit (INDEPENDENT_AMBULATORY_CARE_PROVIDER_SITE_OTHER)
Admission: RE | Admit: 2015-11-22 | Discharge: 2015-11-22 | Disposition: A | Discharge status: Home / Self Care | Payer: Medicare Other | Source: Ambulatory Visit | Attending: Surgery | Admitting: Surgery

## 2015-11-22 ENCOUNTER — Ambulatory Visit (INDEPENDENT_AMBULATORY_CARE_PROVIDER_SITE_OTHER): Payer: Medicare Other | Admitting: Family

## 2015-11-22 ENCOUNTER — Encounter: Payer: Self-pay | Admitting: Family

## 2015-11-22 ENCOUNTER — Ambulatory Visit (HOSPITAL_COMMUNITY)
Admission: RE | Admit: 2015-11-22 | Discharge: 2015-11-22 | Disposition: A | Discharge status: Home / Self Care | Payer: Medicare Other | Source: Ambulatory Visit | Attending: Family | Admitting: Family

## 2015-11-22 VITALS — BP 98/68 | HR 64 | Temp 97.6°F | Resp 16 | Ht 66.0 in | Wt 168.0 lb

## 2015-11-22 DIAGNOSIS — R0989 Other specified symptoms and signs involving the circulatory and respiratory systems: Secondary | ICD-10-CM | POA: Diagnosis not present

## 2015-11-22 DIAGNOSIS — E1151 Type 2 diabetes mellitus with diabetic peripheral angiopathy without gangrene: Secondary | ICD-10-CM | POA: Diagnosis not present

## 2015-11-22 DIAGNOSIS — Z959 Presence of cardiac and vascular implant and graft, unspecified: Secondary | ICD-10-CM | POA: Diagnosis not present

## 2015-11-22 DIAGNOSIS — E119 Type 2 diabetes mellitus without complications: Secondary | ICD-10-CM | POA: Insufficient documentation

## 2015-11-22 DIAGNOSIS — I70409 Unspecified atherosclerosis of autologous vein bypass graft(s) of the extremities, unspecified extremity: Secondary | ICD-10-CM | POA: Diagnosis not present

## 2015-11-22 DIAGNOSIS — Z95828 Presence of other vascular implants and grafts: Secondary | ICD-10-CM

## 2015-11-22 DIAGNOSIS — E785 Hyperlipidemia, unspecified: Secondary | ICD-10-CM | POA: Diagnosis not present

## 2015-11-22 DIAGNOSIS — I779 Disorder of arteries and arterioles, unspecified: Secondary | ICD-10-CM

## 2015-11-22 DIAGNOSIS — R938 Abnormal findings on diagnostic imaging of other specified body structures: Secondary | ICD-10-CM | POA: Diagnosis not present

## 2015-11-22 NOTE — Progress Notes (Signed)
[facility] [place] HISTORY AND PHYSICAL -PAD  History of Present Illness [name] is a 50 y.o. female patient of Dr. Trula Slade who returns today for follow up. She is status post right femoral to below knee popliteal artery bypass graft with vein on 07/05/2010 for nonhealing ulcer, and left femoral to below knee popliteal artery bypass graft with vein on 06/27/2011 . She had a high-grade stenosis identified at the proximal anastomosis on the right and on 06/15/2014 she underwent drug coated balloon angioplasty with a 6 mm balloon.Follow-up ultrasound revealed a recurrence of the stenosis and she was taken back for angiography on 11/22/2014. She had a recurrent high-grade stenosis within the proximal bypass graft. A dissection was created after balloon angioplasty that did not respond to repeat angioplasty therefore a 6 x 30 self-expanding stent was deployed on 11/22/14.   She denies having any open wounds. She did fall from a vertigo attack and landed on a steel toed boots in her back surgery spot has had back pain ever sense. She is only smoking 5 cigarettes/day now.  She has known back issues, had spine surgery x2, has pain in her legs at rest and with walking, difficult to ascertain from her explanation if she has claudication symptoms, denies non healing wounds. She is receiving some injections in her c-spine and lumbar spine which temporarily help her pain.  Pt states she has never had a stroke or TIA. Her PCP is Dr. Delight Stare, [place]. Intermittent diarrhea/constipation, does not use metformin, states she was told that she has IBS.  The patient reports New Medical or Surgical History: is being seen in [facility], Dr. Primus Bravo. Pain clinic, received injections in her back recently which helped her midback but not her lower back and has not improved her tense neck pain. She states she has sciatic issues, pain shooting down both hips.  Pt Diabetic: Yes, pt  states has improved to high 100's glucose, states she is working closely with her PCP Pt smoker: smoker (5 cigarettes/day, started smoking at age 88 or 67 yrs)  Pt meds include: Statin: No, Crestor caused itching and generalized redness Betablocker: No ASA:  81 mg enteric coated daily Other anticoagulants/antiplatelets: no  Past Medical History  Diagnosis Date  . Diabetes mellitus   . PAD (peripheral artery disease) ([place])   . Chronic back pain   . Asthma   . Hyperlipidemia   . Leg pain     With Walking  . Depression   . Arthritis   . Ulcer     Foot  . Reflux   . Joint pain   . Peripheral arterial disease [facility])     Social History Social History  Substance Use Topics  . Smoking status: Current Every Day Smoker -- 5.00 packs/day for 30 years    Types: Cigarettes  . Smokeless tobacco: Never Used  . Alcohol Use: No    Family History Family History  Problem Relation Age of Onset  . Coronary artery disease Mother   . Peripheral vascular disease Mother   . Heart disease Mother     Before age 79  . Other Mother     Venous insuffiency  . Diabetes Mother   . Hyperlipidemia Mother   . Hypertension Mother   . Varicose Veins Mother   . Heart attack Mother   . Heart disease Father   . Diabetes Father   . Diabetes Sister   . Hypertension Sister   . Diabetes Brother   .  Hypertension Brother   . Diabetes Maternal Grandmother   . Diabetes Paternal Grandmother   . Diabetes Paternal Grandfather     Past Surgical History  Procedure Laterality Date  . Arterial bypass surgry  07/05/2010    Right Common Femoral to below knee popliteal BPG  . Skin graft Right 2012    [name] by Dr. Nils Pyle- Right and Left Ankle  . Back surgery      X's  2  . Tonsillectomy    . Spine surgery    . Cholecystectomy      Gall Bladder  . Abdominal aortagram  June 15, 2014  . Intercostal nerve block  November 2015  . Abdominal aortagram N/A 06/15/2014    Procedure: ABDOMINAL Maxcine Ham;  Surgeon:  Serafina Mitchell, MD;  Location: [facility] CATH LAB;  Service: Cardiovascular;  Laterality: N/A;  . Abdominal aortagram N/A 11/22/2014    Procedure: ABDOMINAL AORTAGRAM;  Surgeon: Serafina Mitchell, MD;  Location: [facility] CATH LAB;  Service: Cardiovascular;  Laterality: N/A;  . Cystectomy Right     foot  . Cystectomy Left     wrist  . Left foot surgery    . Left wrist cyst removal Left     Allergies  Allergen Reactions  . Penicillins Other (See Comments)    Severe Headache    Current Outpatient Prescriptions  Medication Sig Dispense Refill  . albuterol (PROVENTIL) 2 MG tablet Take 2 mg by mouth 3 (three) times daily.     Marland Kitchen aspirin 81 MG tablet Take 81 mg by mouth daily.    . cetirizine (ZYRTEC) 10 MG tablet Take 10 mg by mouth daily.    . Cholecalciferol (VITAMIN D3) 50000 units CAPS Take 50,000 Units by mouth once a week.    . ciprofloxacin (CIPRO) 250 MG tablet Take 1 tablet (250 mg total) by mouth 2 (two) times daily. 14 tablet 0  . furosemide (LASIX) 40 MG tablet Take 40 mg by mouth 2 (two) times daily as needed for fluid.     Marland Kitchen HYDROcodone-acetaminophen (NORCO) 10-325 MG tablet Limit 1 tablet by mouth per day or twice per day if tolerated 60 tablet 0  . insulin aspart (NOVOLOG) 100 UNIT/ML injection Inject 10 Units into the skin 3 (three) times daily before meals.    Marland Kitchen LANTUS SOLOSTAR 100 UNIT/ML Solostar Pen Inject 60 Units into the skin at bedtime.     Marland Kitchen loperamide (IMODIUM A-D) 2 MG tablet Take 2 mg by mouth as needed for diarrhea or loose stools.    . meclizine (ANTIVERT) 25 MG tablet Take 25 mg by mouth 3 (three) times daily as needed for dizziness.    . naproxen sodium (ANAPROX) 220 MG tablet Take 220 mg by mouth 2 (two) times daily as needed (pain).    Marland Kitchen omeprazole (PRILOSEC) 20 MG capsule Take 20 mg by mouth daily.    . pregabalin (LYRICA) 100 MG capsule Take 100 mg by mouth 3 (three) times daily.    . pseudoephedrine-guaifenesin (MUCINEX D) 60-600 MG per tablet Take 1 tablet by mouth  every 12 (twelve) hours as needed for congestion.    . simethicone (MYLICON) 0000000 MG chewable tablet Chew 125 mg by mouth every 6 (six) hours as needed for flatulence.    . Vitamin D, Ergocalciferol, (DRISDOL) 50000 UNITS CAPS capsule Take 50,000 Units by mouth every 30 (thirty) days.    . ciprofloxacin (CIPRO) 250 MG tablet Take 1 tablet (250 mg total) by mouth 2 (two) times daily. (Patient not  taking: Reported on 11/22/2015) 14 tablet 0  . ciprofloxacin (CIPRO) 250 MG tablet Take 1 tablet (250 mg total) by mouth 2 (two) times daily. (Patient not taking: Reported on 11/22/2015) 14 tablet 0  . cyclobenzaprine (FLEXERIL) 10 MG tablet Take 10 mg by mouth 3 (three) times daily as needed for muscle spasms. Reported on 11/22/2015     Current Facility-Administered Medications  Medication Dose Route Frequency Provider Last Rate Last Dose  . ciprofloxacin (CIPRO) IVPB 400 mg  400 mg Intravenous Once Mohammed Kindle, MD      . ciprofloxacin (CIPRO) tablet 250 mg  250 mg Oral BID Mohammed Kindle, MD      . fentaNYL (SUBLIMAZE) injection 100 mcg  100 mcg Intravenous Once Mohammed Kindle, MD      . lactated ringers infusion 1,000 mL  1,000 mL Intravenous Continuous Mohammed Kindle, MD      . lactated ringers infusion 1,000 mL  1,000 mL Intravenous Continuous Mohammed Kindle, MD      . lidocaine (PF) (XYLOCAINE) 1 % injection 10 mL  10 mL Subcutaneous Once Mohammed Kindle, MD      . lidocaine (PF) (XYLOCAINE) 1 % injection 10 mL  10 mL Subcutaneous Once Mohammed Kindle, MD      . midazolam (VERSED) 5 MG/5ML injection 5 mg  5 mg Intravenous Once Mohammed Kindle, MD      . orphenadrine (NORFLEX) injection 60 mg  60 mg Intramuscular Once Mohammed Kindle, MD      . orphenadrine (NORFLEX) injection 60 mg  60 mg Intramuscular Once Mohammed Kindle, MD      . sodium chloride 0.9 % injection 20 mL  20 mL Other Once Mohammed Kindle, MD      . sodium chloride 0.9 % injection 20 mL  20 mL Other Once Mohammed Kindle, MD      . triamcinolone acetonide  (KENALOG-40) injection 40 mg  40 mg Other Once Mohammed Kindle, MD       Facility-Administered Medications Ordered in Other Visits  Medication Dose Route Frequency Provider Last Rate Last Dose  . 0.9 %  sodium chloride infusion   Intravenous Continuous Serafina Mitchell, MD        ROS: See HPI for pertinent positives and negatives.   Physical Examination  Filed Vitals:   11/22/15 1322  BP: 98/68  Pulse: 64  Temp: 97.6 F (36.4 C)  Resp: 16  Height: 5\' 6"  (1.676 m)  Weight: 168 lb (76.204 kg)  SpO2: 97%   Body mass index is 27.13 kg/(m^2).  General: A&O x 3, WDWN. Gait: normal Eyes: PERRLA. Pulmonary: CTAB, without wheezes , rales or rhonchi. Cardiac: regular Rythm , without detected murmur.     Carotid Bruits Left Right   Negative Negative  Aorta is not palpable. Radial pulses: are not palpable, brachial pulses are not palpable. Capillary refill in fingers of both hands is brisk, fingers are pink   VASCULAR EXAM: Extremities without ischemic changes  without Gangrene; without open wounds.     LE Pulses LEFT RIGHT   AORTA Not palpable N/A   FEMORAL 2+ palpable 2+ palpable    POPLITEAL not palpable  not palpable   POSTERIOR TIBIAL not palpable  not palpable    DORSALIS PEDIS  ANTERIOR TIBIAL 2+ palpable  not palpable    Abdomen: soft, NT, no masses palpated. Skin: no rashes, no ulcers. Musculoskeletal: no muscle wasting or atrophy. Neurologic: A&O X 3; Appropriate Affect ; SENSATION: normal; MOTOR FUNCTION: moving all extremities equally,  motor strength 3/5 throughout. Speech is fluent/normal. CN 2-12 intact.           Non-Invasive Vascular Imaging: DATE: 11/22/2015 LOWER EXTREMITY ARTERIAL DUPLEX  EVALUATION    INDICATION: Peripheral vascular disease     PREVIOUS INTERVENTION(S): Right stent placed in proximal graft segment of femoropopliteal bypass graft 11/22/2014. Graft originally placed 07/05/2010. Left femoropopliteal bypass graft placed 06/27/2011. Right external iliac artery, common femoral artery and proximal graft angioplasty 06/15/2014.    DUPLEX EXAM:     RIGHT  LEFT   Peak Systolic Velocity (cm/s) Ratio (if abnormal) Waveform  Peak Systolic Velocity (cm/s) Ratio (if abnormal) Waveform  116  M  Inflow Artery 277  B   108  M Proximal Anastomosis 75  B  158 (stent)  M Proximal Graft 49  B  38  M Mid Graft 42  B  31  M  Distal Graft 55  B  84 2.70 M Distal Anastomosis 83  B  30  M Outflow Artery 73  B  Cannot be accurately determined Today's ABI / TBI Cannot be accurately determined  Could not be accurately determined Previous ABI / TBI (03/06/2015  ) Could not be accurately determined    Waveform:    M - Monophasic       B - Biphasic       T - Triphasic  If Ankle Brachial Index (ABI) or Toe Brachial Index (TBI) performed, please see complete report     ADDITIONAL FINDINGS: Impression noted on page two of this report.       IMPRESSION: Patent bilateral femoropopliteal bypass grafts with a velocity ratio at the right distal anastomosis suggestive of a 50-70% stenosis. Patent right stent in the proximal segment of the graft with mild hyperplasia noted at the stent inflow.    Compared to the previous exam:  No significant changes in comparison to the last exam on 03/06/2015; however, the right distal anastomosis velocity was not mentioned.     ASSESSMENT: [name] is a 50 y.o. female who is s/p placement of right stent in proximal graft segment of femoropopliteal bypass graft 11/22/2014. Graft originally placed 07/05/2010. Left femoropopliteal bypass graft placed 06/27/2011. Right external iliac artery, common femoral artery and proximal graft angioplasty 06/15/2014.    Today's bilateral LE arterial duplex suggests patent bilateral femoropopliteal bypass grafts with a velocity ratio at the right distal anastomosis suggestive of a 50-70% stenosis. Patent right stent in the proximal segment of the graft with mild hyperplasia noted at the stent inflow. No significant changes in comparison to the last exam on 03/06/2015; however, the right distal anastomosis velocity was not mentioned.   Bilateral brachial and radial pulses are not palpable, bilateral brachial artery waveforms are monophasic on ABI's.   Pt states she was informed years ago that she has arterial blockages in her arms, but does not want surgery for this. She has tingling, numbness, and cool fingers of both hands, but attributes this to her DM neuropathy. All fingers are cool but pink with fairly brisk capillary refill.    PLAN:  Based on the patient's vascular studies and examination, pt will return to clinic in 3 months with ABI's, bilateral LE arterial duplex, and carotid duplex (monophasic brachial artery waveforms with ABI's).  Daily seated leg exercises as demonstrated and discussed. Pt has issues with falling and vertigo.   I discussed in depth with the patient the nature of atherosclerosis, and emphasized the importance of maximal medical management including strict  control of blood pressure, blood glucose, and lipid levels, obtaining regular exercise, and continued cessation of smoking.  The patient is aware that without maximal medical management the underlying atherosclerotic disease process will progress, limiting the benefit of any interventions.  The patient was given information about PAD including signs, symptoms, treatment, what symptoms should prompt the patient to seek immediate medical care, and risk reduction measures to take.  Clemon Chambers, RN, MSN, FNP-C [facility] [employer] Phone: [phone]  Clinic MD: Scot Dock  11/22/2015 2:11 PM

## 2015-11-23 NOTE — Addendum Note (Signed)
Addended by: Dorthula Rue L on: 11/23/2015 08:25 AM   Modules accepted: Orders

## 2015-11-24 ENCOUNTER — Other Ambulatory Visit: Payer: Self-pay | Admitting: Pain Medicine

## 2015-12-07 ENCOUNTER — Encounter: Payer: Self-pay | Admitting: Pain Medicine

## 2015-12-07 ENCOUNTER — Ambulatory Visit: Payer: Medicare Other | Attending: Pain Medicine | Admitting: Pain Medicine

## 2015-12-07 VITALS — BP 155/77 | HR 101 | Temp 98.0°F | Resp 16 | Ht 66.0 in | Wt 168.0 lb

## 2015-12-07 DIAGNOSIS — M461 Sacroiliitis, not elsewhere classified: Secondary | ICD-10-CM | POA: Insufficient documentation

## 2015-12-07 DIAGNOSIS — M5136 Other intervertebral disc degeneration, lumbar region: Secondary | ICD-10-CM | POA: Diagnosis not present

## 2015-12-07 DIAGNOSIS — M5481 Occipital neuralgia: Secondary | ICD-10-CM | POA: Insufficient documentation

## 2015-12-07 DIAGNOSIS — M25559 Pain in unspecified hip: Secondary | ICD-10-CM

## 2015-12-07 DIAGNOSIS — M533 Sacrococcygeal disorders, not elsewhere classified: Secondary | ICD-10-CM | POA: Insufficient documentation

## 2015-12-07 DIAGNOSIS — M5030 Other cervical disc degeneration, unspecified cervical region: Secondary | ICD-10-CM | POA: Diagnosis not present

## 2015-12-07 DIAGNOSIS — M47816 Spondylosis without myelopathy or radiculopathy, lumbar region: Secondary | ICD-10-CM

## 2015-12-07 DIAGNOSIS — I7092 Chronic total occlusion of artery of the extremities: Secondary | ICD-10-CM

## 2015-12-07 DIAGNOSIS — R51 Headache: Secondary | ICD-10-CM | POA: Insufficient documentation

## 2015-12-07 DIAGNOSIS — M542 Cervicalgia: Secondary | ICD-10-CM | POA: Diagnosis present

## 2015-12-07 DIAGNOSIS — I739 Peripheral vascular disease, unspecified: Secondary | ICD-10-CM | POA: Insufficient documentation

## 2015-12-07 DIAGNOSIS — M546 Pain in thoracic spine: Secondary | ICD-10-CM | POA: Diagnosis present

## 2015-12-07 DIAGNOSIS — M5137 Other intervertebral disc degeneration, lumbosacral region: Secondary | ICD-10-CM

## 2015-12-07 MED ORDER — HYDROCODONE-ACETAMINOPHEN 10-325 MG PO TABS: ORAL_TABLET | ORAL | Status: DC

## 2015-12-07 NOTE — Progress Notes (Signed)
Subjective:    Patient ID: [name], female    DOB: 1966-04-24, 50 y.o.   MRN: [id number]  HPI  The patient is a 50 year old female who returns to pain management Center for further evaluation and treatment of pain involving the neck headaches as well as the upper mid lower back and lower extremity regions. States that her pain appears to be significant with significant muscle spasms occurring in the neck with pain in the neck radiating to the back of the hip precipitating headaches. Patient denies any trauma change in events of daily living because change in symptomatology. The patient missed pain occurring in the mid back region associated with muscle spasms as well as lower back lower extremity pain of lesser degree. The patient continues medications as prescribed. The patient also has back brace and has TENS unit and continues to have significant pain occurring in the neck with pain of the neck radiating to the back of the hip precipitating headaches. Decision has been made to continue medications as prescribed and to proceed with greater occipital nerve block with myoneural block injections at time return appointment as discussed and as explained to patient on today's visit who was with understanding and in agreement with suggested treatment plan.     Review of Systems     Objective:   Physical Exam  There was tenderness to palpation of paraspinal musculature in the cervical region cervical facet region palpation which reproduce moderate discomfort. There was moderate to moderately severe tenderness to palpation of the splenius capitis and occipitalis musculature region on the left as well as on the right. No masses of the head and neck were noted. No bounding pulsations of the temporal regions were noted. Palpation over the region of the acromioclavicular and glenohumeral joint regions reproduces moderate discomfort and patient appeared to be with minimal increase of pain with Tinel  and Phalen's maneuver. There was moderate muscle spasms noted of the cervical thoracic and lumbar regions. No crepitus of the thoracic region noted. Palpation over the lumbar paraspinal must reason lumbar facet region was with moderate tends to palpation with lateral bending rotation extension and palpation of the lumbar facets reproducing moderate discomfort. There was moderate tenderness of the PSIS and PII S region as well as the gluteal and piriformis musculature regions with mild tenderness of the greater trochanteric region iliotibial band region leg raise was tolerates approximately 20 without increased pain with dorsiflexion noted. Patient had difficulty attempt to stand on tiptoes and heels. Palpation of the lumbar facet region associated with moderate increased pain with lateral bending rotation extension and palpation of the lumbar facets reproducing moderate discomfort. DTRs were difficult to this patient had difficulty relaxing. No sensory deficit or dermatomal distribution detected. Negative clonus negative Homans. Abdomen nontender with no costovertebral tenderness noted.      Assessment & Plan:   Greater occipital neuralgia  Degenerative disc disease of the cervical spine  Cervical facet syndrome  Degenerative disc disease lumbar spine  Lumbar facet syndrome  Peripheral vascular disease of the lower extremities  Sacroiliitis sacroiliac joint dysfunction     PLAN   Continue present medication Zanaflex  Topamax  carbamazepine and oxycodone  .Marland Kitchen Review your medications Topamax and carbamazepine with Dr.Gutierrez and with your's psychiatrist to be sure that  they wish for you to take both of these medications   Block of nerves to the sacroiliac joint to be performed at time return appointment  F/U PCP Dr. Danise Mina for evaliation of  BP and general medical  condition  F/U surgical evaluation. Follow-up surgical evaluation as discussed  F/U with psychiatrist and discuss  medications especially Topamax and carbamazepine with your psychiatrist as we discussed today to be sure that he wishes for you to take both medications  Ask receptionists if your insurance, has approved you for radiofrequency rhizolysis lumbar facet your branch nerves    F/U neurological evaluation. May consider pending follow-up evaluations  May consider radiofrequency rhizolysis or intraspinal procedures pending response to present treatment and F/U evaluation   Patient to call Pain Management Center should patient have concerns prior to scheduled return appointment.

## 2015-12-07 NOTE — Progress Notes (Signed)
Safety precautions to be maintained throughout the outpatient stay will include: orient to surroundings, keep bed in low position, maintain call bell within reach at all times, provide assistance with transfer out of bed and ambulation.  

## 2015-12-07 NOTE — Patient Instructions (Addendum)
PLAN   Continue present medication hydrocodone acetaminophen . Begin application of cream to painful area as prescribed  Greater occipital nerve block to be performed at time of return appointment  F/U PCP Dr. Lennox Grumbles for evaliation of  BP and general medical  condition  F/U surgical evaluation. May consider pending follow-up evaluations  F/U neurological evaluation. May consider pending follow-up evaluations  May consider radiofrequency rhizolysis or intraspinal procedures pending response to present treatment and F/U evaluation   Patient to call [facility] should patient have concerns prior to scheduled return appointment.GENERAL RISKS AND COMPLICATIONS  What are the risk, side effects and possible complications? Generally speaking, most procedures are safe.  However, with any procedure there are risks, side effects, and the possibility of complications.  The risks and complications are dependent upon the sites that are lesioned, or the type of nerve block to be performed.  The closer the procedure is to the spine, the more serious the risks are.  Great care is taken when placing the radio frequency needles, block needles or lesioning probes, but sometimes complications can occur. 1. Infection: Any time there is an injection through the skin, there is a risk of infection.  This is why sterile conditions are used for these blocks.  There are four possible types of infection. 1. Localized skin infection. 2. Central Nervous System Infection-This can be in the form of Meningitis, which can be deadly. 3. Epidural Infections-This can be in the form of an epidural abscess, which can cause pressure inside of the spine, causing compression of the spinal cord with subsequent paralysis. This would require an emergency surgery to decompress, and there are no guarantees that the patient would recover from the paralysis. 4. Discitis-This is an infection of the intervertebral discs.  It occurs in  about 1% of discography procedures.  It is difficult to treat and it may lead to surgery.        2. Pain: the needles have to go through skin and soft tissues, will cause soreness.       3. Damage to internal structures:  The nerves to be lesioned may be near blood vessels or    other nerves which can be potentially damaged.       4. Bleeding: Bleeding is more common if the patient is taking blood thinners such as  aspirin, Coumadin, Ticiid, Plavix, etc., or if he/she have some genetic predisposition  such as hemophilia. Bleeding into the spinal canal can cause compression of the spinal  cord with subsequent paralysis.  This would require an emergency surgery to  decompress and there are no guarantees that the patient would recover from the  paralysis.       5. Pneumothorax:  Puncturing of a lung is a possibility, every time a needle is introduced in  the area of the chest or upper back.  Pneumothorax refers to free air around the  collapsed lung(s), inside of the thoracic cavity (chest cavity).  Another two possible  complications related to a similar event would include: Hemothorax and Chylothorax.   These are variations of the Pneumothorax, where instead of air around the collapsed  lung(s), you may have blood or chyle, respectively.       6. Spinal headaches: They may occur with any procedures in the area of the spine.       7. Persistent CSF (Cerebro-Spinal Fluid) leakage: This is a rare problem, but may occur  with prolonged intrathecal or epidural catheters either due to  the formation of a fistulous  track or a dural tear.       8. Nerve damage: By working so close to the spinal cord, there is always a possibility of  nerve damage, which could be as serious as a permanent spinal cord injury with  paralysis.       9. Death:  Although rare, severe deadly allergic reactions known as "Anaphylactic  reaction" can occur to any of the medications used.      10. Worsening of the symptoms:  We can always  make thing worse.  What are the chances of something like this happening? Chances of any of this occuring are extremely low.  By statistics, you have more of a chance of getting killed in a motor vehicle accident: while driving to the hospital than any of the above occurring .  Nevertheless, you should be aware that they are possibilities.  In general, it is similar to taking a shower.  Everybody knows that you can slip, hit your head and get killed.  Does that mean that you should not shower again?  Nevertheless always keep in mind that statistics do not mean anything if you happen to be on the wrong side of them.  Even if a procedure has a 1 (one) in a 1,000,000 (million) chance of going wrong, it you happen to be that one..Also, keep in mind that by statistics, you have more of a chance of having something go wrong when taking medications.  Who should not have this procedure? If you are on a blood thinning medication (e.g. Coumadin, Plavix, see list of "Blood Thinners"), or if you have an active infection going on, you should not have the procedure.  If you are taking any blood thinners, please inform your physician.  How should I prepare for this procedure?  Do not eat or drink anything at least six hours prior to the procedure.  Bring a driver with you .  It cannot be a taxi.  Come accompanied by an adult that can drive you back, and that is strong enough to help you if your legs get weak or numb from the local anesthetic.  Take all of your medicines the morning of the procedure with just enough water to swallow them.  If you have diabetes, make sure that you are scheduled to have your procedure done first thing in the morning, whenever possible.  If you have diabetes, take only half of your insulin dose and notify our nurse that you have done so as soon as you arrive at the clinic.  If you are diabetic, but only take blood sugar pills (oral hypoglycemic), then do not take them on the  morning of your procedure.  You may take them after you have had the procedure.  Do not take aspirin or any aspirin-containing medications, at least eleven (11) days prior to the procedure.  They may prolong bleeding.  Wear loose fitting clothing that may be easy to take off and that you would not mind if it got stained with Betadine or blood.  Do not wear any jewelry or perfume  Remove any nail coloring.  It will interfere with some of our monitoring equipment.  NOTE: Remember that this is not meant to be interpreted as a complete list of all possible complications.  Unforeseen problems may occur.  BLOOD THINNERS The following drugs contain aspirin or other products, which can cause increased bleeding during surgery and should not be taken for 2 weeks prior  to and 1 week after surgery.  If you should need take something for relief of minor pain, you may take acetaminophen which is found in Tylenol,m Datril, Anacin-3 and Panadol. It is not blood thinner. The products listed below are.  Do not take any of the products listed below in addition to any listed on your instruction sheet.  A.P.C or A.P.C with Codeine Codeine Phosphate Capsules #3 Ibuprofen Ridaura  ABC compound Congesprin Imuran rimadil  Advil Cope Indocin Robaxisal  Alka-Seltzer Effervescent Pain Reliever and Antacid Coricidin or Coricidin-D  Indomethacin Rufen  Alka-Seltzer plus Cold Medicine Cosprin Ketoprofen S-A-C Tablets  Anacin Analgesic Tablets or Capsules Coumadin Korlgesic Salflex  Anacin Extra Strength Analgesic tablets or capsules CP-2 Tablets Lanoril Salicylate  Anaprox Cuprimine Capsules Levenox Salocol  Anexsia-D Dalteparin Magan Salsalate  Anodynos Darvon compound Magnesium Salicylate Sine-off  Ansaid Dasin Capsules Magsal Sodium Salicylate  Anturane Depen Capsules Marnal Soma  APF Arthritis pain formula Dewitt's Pills Measurin Stanback  Argesic Dia-Gesic Meclofenamic Sulfinpyrazone  Arthritis Bayer Timed  Release Aspirin Diclofenac Meclomen Sulindac  Arthritis pain formula Anacin Dicumarol Medipren Supac  Analgesic (Safety coated) Arthralgen Diffunasal Mefanamic Suprofen  Arthritis Strength Bufferin Dihydrocodeine Mepro Compound Suprol  Arthropan liquid Dopirydamole Methcarbomol with Aspirin Synalgos  ASA tablets/Enseals Disalcid Micrainin Tagament  Ascriptin Doan's Midol Talwin  Ascriptin A/D Dolene Mobidin Tanderil  Ascriptin Extra Strength Dolobid Moblgesic Ticlid  Ascriptin with Codeine Doloprin or Doloprin with Codeine Momentum Tolectin  Asperbuf Duoprin Mono-gesic Trendar  Aspergum Duradyne Motrin or Motrin IB Triminicin  Aspirin plain, buffered or enteric coated Durasal Myochrisine Trigesic  Aspirin Suppositories Easprin Nalfon Trillsate  Aspirin with Codeine Ecotrin Regular or Extra Strength Naprosyn Uracel  Atromid-S Efficin Naproxen Ursinus  Auranofin Capsules Elmiron Neocylate Vanquish  Axotal Emagrin Norgesic Verin  Azathioprine Empirin or Empirin with Codeine Normiflo Vitamin E  Azolid Emprazil Nuprin Voltaren  Bayer Aspirin plain, buffered or children's or timed BC Tablets or powders Encaprin Orgaran Warfarin Sodium  Buff-a-Comp Enoxaparin Orudis Zorpin  Buff-a-Comp with Codeine Equegesic Os-Cal-Gesic   Buffaprin Excedrin plain, buffered or Extra Strength Oxalid   Bufferin Arthritis Strength Feldene Oxphenbutazone   Bufferin plain or Extra Strength Feldene Capsules Oxycodone with Aspirin   Bufferin with Codeine Fenoprofen Fenoprofen Pabalate or Pabalate-SF   Buffets II Flogesic Panagesic   Buffinol plain or Extra Strength Florinal or Florinal with Codeine Panwarfarin   Buf-Tabs Flurbiprofen Penicillamine   Butalbital Compound Four-way cold tablets Penicillin   Butazolidin Fragmin Pepto-Bismol   Carbenicillin Geminisyn Percodan   Carna Arthritis Reliever Geopen Persantine   Carprofen Gold's salt Persistin   Chloramphenicol Goody's Phenylbutazone   Chloromycetin  [place] Piroxlcam   Clmetidine heparin Plaquenil   Cllnoril Hyco-pap Ponstel   Clofibrate Hydroxy chloroquine Propoxyphen         Before stopping any of these medications, be sure to consult the physician who ordered them.  Some, such as Coumadin (Warfarin) are ordered to prevent or treat serious conditions such as "deep thrombosis", "pumonary embolisms", and other heart problems.  The amount of time that you may need off of the medication may also vary with the medication and the reason for which you were taking it.  If you are taking any of these medications, please make sure you notify your pain physician before you undergo any procedures.         Occipital Nerve Block Patient Information  Description: The occipital nerves originate in the cervical (neck) spinal cord and travel upward through muscle and tissue to supply sensation  to the back of the head and top of the scalp.  In addition, the nerves control some of the muscles of the scalp.  Occipital neuralgia is an irritation of these nerves which can cause headaches, numbness of the scalp, and neck discomfort.     The occipital nerve block will interrupt nerve transmission through these nerves and can relieve pain and spasm.  The block consists of insertion of a small needle under the skin in the back of the head to deposit local anesthetic (numbing medicine) and/or steroids around the nerve.  The entire block usually lasts less than 5 minutes.  Conditions which may be treated by occipital blocks:   Muscular pain and spasm of the scalp  Nerve irritation, back of the head  Headaches  Upper neck pain  Preparation for the injection:  12. Do not eat any solid food or dairy products within 6 hours of your appointment. 13. You may drink clear liquids up to 2 hours before appointment.  Clear liquids include water, black coffee, juice or soda.  No milk or cream please. 14. You may take your regular medication, including pain  medications, with a sip of water before you appointment.  Diabetics should hold regular insulin (if taken separately) and take 1/2 normal NPH dose the morning of the procedure.  Carry some sugar containing items with you to your appointment. 15. A driver must accompany you and be prepared to drive you home after your procedure. 59. Bring all your current medications with you. 17. An IV may be inserted and sedation may be given at the discretion of the physician. 18. A blood pressure cuff, EKG, and other monitors will often be applied during the procedure.  Some patients may need to have extra oxygen administered for a short period. 62. You will be asked to provide medical information, including your allergies and medications, prior to the procedure.  We must know immediately if you are taking blood thinners (like Coumadin/Warfarin) or if you are allergic to IV iodine contrast (dye).  We must know if you could possible be pregnant.  20. Do not wear a high collared shirt or turtleneck.  Tie long hair up in the back if possible.  Possible side-effects:   Bleeding from needle site  Infection (rare, may require surgery)  Nerve injury (rare)  Hair on back of neck can be tinged with iodine scrub (this will wash out)  Light-headedness (temporary)  Pain at injection site (several days)  Decreased blood pressure (rare, temporary)  Seizure (very rare)  Call if you experience:   Hives or difficulty breathing ( go to the emergency room)  Inflammation or drainage at the injection site(s)  Please note:  Although the local anesthetic injected can often make your painful muscles or headache feel good for several hours after the injection, the pain may return.  It takes 3-7 days for steroids to work.  You may not notice any pain relief for at least one week.  If effective, we will often do a series of injections spaced 3-6 weeks apart to maximally decrease your pain.  If you have any questions,  please call [phone] [facility]

## 2015-12-08 ENCOUNTER — Telehealth: Payer: Self-pay | Admitting: *Deleted

## 2015-12-08 ENCOUNTER — Telehealth: Payer: Self-pay | Admitting: Pain Medicine

## 2015-12-08 NOTE — Telephone Encounter (Signed)
Pharmacy has questions about script turned in by patient please call

## 2015-12-08 NOTE — Telephone Encounter (Signed)
Called to patients pharmacy to answer question that they called about.  They were unaware of the call that had been made to [place].

## 2016-01-03 ENCOUNTER — Encounter: Payer: Self-pay | Admitting: Pain Medicine

## 2016-01-03 ENCOUNTER — Ambulatory Visit: Payer: Medicare Other | Attending: Pain Medicine | Admitting: Pain Medicine

## 2016-01-03 VITALS — BP 133/80 | HR 105 | Temp 98.0°F | Resp 16 | Ht 66.0 in | Wt 168.0 lb

## 2016-01-03 DIAGNOSIS — M5481 Occipital neuralgia: Secondary | ICD-10-CM | POA: Insufficient documentation

## 2016-01-03 DIAGNOSIS — M25559 Pain in unspecified hip: Secondary | ICD-10-CM

## 2016-01-03 DIAGNOSIS — I739 Peripheral vascular disease, unspecified: Secondary | ICD-10-CM

## 2016-01-03 DIAGNOSIS — M542 Cervicalgia: Secondary | ICD-10-CM | POA: Diagnosis present

## 2016-01-03 DIAGNOSIS — M533 Sacrococcygeal disorders, not elsewhere classified: Secondary | ICD-10-CM | POA: Insufficient documentation

## 2016-01-03 DIAGNOSIS — I7092 Chronic total occlusion of artery of the extremities: Secondary | ICD-10-CM

## 2016-01-03 DIAGNOSIS — M461 Sacroiliitis, not elsewhere classified: Secondary | ICD-10-CM | POA: Insufficient documentation

## 2016-01-03 DIAGNOSIS — M5136 Other intervertebral disc degeneration, lumbar region: Secondary | ICD-10-CM | POA: Insufficient documentation

## 2016-01-03 DIAGNOSIS — R51 Headache: Secondary | ICD-10-CM | POA: Diagnosis present

## 2016-01-03 DIAGNOSIS — M546 Pain in thoracic spine: Secondary | ICD-10-CM | POA: Diagnosis present

## 2016-01-03 DIAGNOSIS — M5030 Other cervical disc degeneration, unspecified cervical region: Secondary | ICD-10-CM | POA: Diagnosis not present

## 2016-01-03 DIAGNOSIS — M5134 Other intervertebral disc degeneration, thoracic region: Secondary | ICD-10-CM | POA: Diagnosis not present

## 2016-01-03 DIAGNOSIS — M5137 Other intervertebral disc degeneration, lumbosacral region: Secondary | ICD-10-CM

## 2016-01-03 DIAGNOSIS — M47816 Spondylosis without myelopathy or radiculopathy, lumbar region: Secondary | ICD-10-CM

## 2016-01-03 MED ORDER — HYDROCODONE-ACETAMINOPHEN 10-325 MG PO TABS: ORAL_TABLET | ORAL | Status: DC

## 2016-01-03 NOTE — Progress Notes (Signed)
   Subjective:    Patient ID: [name], female    DOB: 04-Jan-1966, 50 y.o.   MRN: [id number]  HPI Patient is a 50 year old female returns to pain management for further evaluation and treatment of pain involving the region of the neck entire back upper extremity region and headaches. The patient has pain involving the neck entire back upper extremity region associated with significant spasms. We discussed patient's overall condition and at the present time we preferred to avoid interventional treatment. We will have patient follow up with primary care physician to discuss her general medical condition. We will continue medications as presently prescribed. The patient denies any trauma change in events of daily living of significant degree. The patient states that the pains of muscle spasm of significant degree which and appears all activities of daily living. We previously prescribed a topical cream for treatment of patient's pain which patient stated was ineffective. We will continue hydrocodone acetaminophen at this time and remain available to consider additional modifications of treatment pending follow-up evaluation. His was in agreement with suggested treatment plan   Review of Systems     Objective:   Physical Exam  There was tenderness of the splenius capitis and occipitalis muscles regional moderate to moderately severe degree. There was moderate tenderness of the cervical facet cervical paraspinal musculature region. Palpation over the thoracic facet thoracic paraspinal must reason was with moderate tends to palpation with moderate muscle spasms noted throughout the thoracic region. The patient appeared to be with slightly decreased grip strength and Tinel and Phalen's maneuver reproduced moderate discomfort. There was tenderness over the lumbar paraspinal machine lumbar facet region a moderate degree with lateral bending rotation extension and palpation of the lumbar facets reproducing  moderate discomfort. Straight leg raising was tolerates approximately 20 with no increase of pain with dorsiflexion noted. Negative clonus negative Homans. There was moderate tenderness of the PSIS and PII S regions and mild tenderness of the greater trochanteric region and iliotibial band region. No sensory deficit or dermatomal distribution of the extremities were noted. No increased warmth erythema of the extremities were noted the abdomen was nontender with no costovertebral tenderness noted.      Assessment & Plan:      Greater occipital neuralgia  Degenerative disc disease of the cervical spine  Cervical facet syndrome  Degenerative disc disease of the thoracic spine  Thoracic facet syndrome  Degenerative disc disease lumbar spine  Lumbar facet syndrome  Peripheral vascular disease of the lower extremities  Sacroiliitis sacroiliac joint dysfunction     PLAN  Continue present medication hydrocodone acetaminophen  F/U PCP Dr. Lennox Grumbles for evaliation of  BP diabetes mellitus and general medical  condition  F/U surgical evaluation. May consider pending follow-up evaluations  F/U neurological evaluation. May consider pending follow-up evaluations  May consider radiofrequency rhizolysis or intraspinal procedures pending response to present treatment and F/U evaluation   Patient to call [facility] should patient have concerns prior to scheduled return appointment.

## 2016-01-03 NOTE — Patient Instructions (Addendum)
Continue present medication hydrocodone acetaminophen  F/U PCP Dr. Lennox Grumbles for evaliation of  BP diabetes mellitus and general medical  condition  F/U surgical evaluation. May consider pending follow-up evaluations  F/U neurological evaluation. May consider pending follow-up evaluations  May consider radiofrequency rhizolysis or intraspinal procedures pending response to present treatment and F/U evaluation   Patient to call [facility] Center should patient have concerns prior to scheduled return appointment.

## 2016-01-03 NOTE — Progress Notes (Signed)
Safety precautions to be maintained throughout the outpatient stay will include: orient to surroundings, keep bed in low position, maintain call bell within reach at all times, provide assistance with transfer out of bed and ambulation.  

## 2016-01-30 ENCOUNTER — Ambulatory Visit: Payer: Medicare Other | Attending: Pain Medicine | Admitting: Pain Medicine

## 2016-01-30 ENCOUNTER — Encounter: Payer: Self-pay | Admitting: Pain Medicine

## 2016-01-30 VITALS — BP 123/86 | HR 106 | Temp 98.0°F | Resp 16 | Ht 66.0 in | Wt 167.0 lb

## 2016-01-30 DIAGNOSIS — M5481 Occipital neuralgia: Secondary | ICD-10-CM | POA: Diagnosis not present

## 2016-01-30 DIAGNOSIS — I7092 Chronic total occlusion of artery of the extremities: Secondary | ICD-10-CM

## 2016-01-30 DIAGNOSIS — M5136 Other intervertebral disc degeneration, lumbar region: Secondary | ICD-10-CM | POA: Insufficient documentation

## 2016-01-30 DIAGNOSIS — M47816 Spondylosis without myelopathy or radiculopathy, lumbar region: Secondary | ICD-10-CM

## 2016-01-30 DIAGNOSIS — M5030 Other cervical disc degeneration, unspecified cervical region: Secondary | ICD-10-CM | POA: Diagnosis not present

## 2016-01-30 DIAGNOSIS — M542 Cervicalgia: Secondary | ICD-10-CM | POA: Diagnosis present

## 2016-01-30 DIAGNOSIS — M5137 Other intervertebral disc degeneration, lumbosacral region: Secondary | ICD-10-CM

## 2016-01-30 DIAGNOSIS — M545 Low back pain: Secondary | ICD-10-CM | POA: Diagnosis present

## 2016-01-30 DIAGNOSIS — M533 Sacrococcygeal disorders, not elsewhere classified: Secondary | ICD-10-CM | POA: Diagnosis not present

## 2016-01-30 DIAGNOSIS — M51379 Other intervertebral disc degeneration, lumbosacral region without mention of lumbar back pain or lower extremity pain: Secondary | ICD-10-CM

## 2016-01-30 DIAGNOSIS — R51 Headache: Secondary | ICD-10-CM | POA: Diagnosis present

## 2016-01-30 DIAGNOSIS — M5134 Other intervertebral disc degeneration, thoracic region: Secondary | ICD-10-CM | POA: Insufficient documentation

## 2016-01-30 DIAGNOSIS — M25559 Pain in unspecified hip: Secondary | ICD-10-CM

## 2016-01-30 DIAGNOSIS — I739 Peripheral vascular disease, unspecified: Secondary | ICD-10-CM

## 2016-01-30 MED ORDER — HYDROCODONE-ACETAMINOPHEN 10-325 MG PO TABS: ORAL_TABLET | ORAL | Status: DC

## 2016-01-30 NOTE — Patient Instructions (Addendum)
PLAN   Continue present medication hydrocodone acetaminophen  Block of nerves to the sacroiliac joint to be performed at time return appointment  F/U PCP Dr. Lennox Grumbles for evaliation of  BP diabetes mellitus and general medical  condition  F/U surgical evaluation. May consider pending follow-up evaluations  F/U neurological evaluation. May consider PNCV/EMG studies and other studies pending follow-up evaluations  May consider radiofrequency rhizolysis or intraspinal procedures pending response to present treatment and F/U evaluation   Patient to call [facility] should patient have concerns prior to scheduled return appointment.GENERAL RISKS AND COMPLICATIONS  What are the risk, side effects and possible complications? Generally speaking, most procedures are safe.  However, with any procedure there are risks, side effects, and the possibility of complications.  The risks and complications are dependent upon the sites that are lesioned, or the type of nerve block to be performed.  The closer the procedure is to the spine, the more serious the risks are.  Great care is taken when placing the radio frequency needles, block needles or lesioning probes, but sometimes complications can occur. 1. Infection: Any time there is an injection through the skin, there is a risk of infection.  This is why sterile conditions are used for these blocks.  There are four possible types of infection. 1. Localized skin infection. 2. Central Nervous System Infection-This can be in the form of Meningitis, which can be deadly. 3. Epidural Infections-This can be in the form of an epidural abscess, which can cause pressure inside of the spine, causing compression of the spinal cord with subsequent paralysis. This would require an emergency surgery to decompress, and there are no guarantees that the patient would recover from the paralysis. 4. Discitis-This is an infection of the intervertebral discs.  It occurs in  about 1% of discography procedures.  It is difficult to treat and it may lead to surgery.        2. Pain: the needles have to go through skin and soft tissues, will cause soreness.       3. Damage to internal structures:  The nerves to be lesioned may be near blood vessels or    other nerves which can be potentially damaged.       4. Bleeding: Bleeding is more common if the patient is taking blood thinners such as  aspirin, Coumadin, Ticiid, Plavix, etc., or if he/she have some genetic predisposition  such as hemophilia. Bleeding into the spinal canal can cause compression of the spinal  cord with subsequent paralysis.  This would require an emergency surgery to  decompress and there are no guarantees that the patient would recover from the  paralysis.       5. Pneumothorax:  Puncturing of a lung is a possibility, every time a needle is introduced in  the area of the chest or upper back.  Pneumothorax refers to free air around the  collapsed lung(s), inside of the thoracic cavity (chest cavity).  Another two possible  complications related to a similar event would include: Hemothorax and Chylothorax.   These are variations of the Pneumothorax, where instead of air around the collapsed  lung(s), you may have blood or chyle, respectively.       6. Spinal headaches: They may occur with any procedures in the area of the spine.       7. Persistent CSF (Cerebro-Spinal Fluid) leakage: This is a rare problem, but may occur  with prolonged intrathecal or epidural catheters either due to the  formation of a fistulous  track or a dural tear.       8. Nerve damage: By working so close to the spinal cord, there is always a possibility of  nerve damage, which could be as serious as a permanent spinal cord injury with  paralysis.       9. Death:  Although rare, severe deadly allergic reactions known as "Anaphylactic  reaction" can occur to any of the medications used.      10. Worsening of the symptoms:  We can always  make thing worse.  What are the chances of something like this happening? Chances of any of this occuring are extremely low.  By statistics, you have more of a chance of getting killed in a motor vehicle accident: while driving to the hospital than any of the above occurring .  Nevertheless, you should be aware that they are possibilities.  In general, it is similar to taking a shower.  Everybody knows that you can slip, hit your head and get killed.  Does that mean that you should not shower again?  Nevertheless always keep in mind that statistics do not mean anything if you happen to be on the wrong side of them.  Even if a procedure has a 1 (one) in a 1,000,000 (million) chance of going wrong, it you happen to be that one..Also, keep in mind that by statistics, you have more of a chance of having something go wrong when taking medications.  Who should not have this procedure? If you are on a blood thinning medication (e.g. Coumadin, Plavix, see list of "Blood Thinners"), or if you have an active infection going on, you should not have the procedure.  If you are taking any blood thinners, please inform your physician.  How should I prepare for this procedure?  Do not eat or drink anything at least six hours prior to the procedure.  Bring a driver with you .  It cannot be a taxi.  Come accompanied by an adult that can drive you back, and that is strong enough to help you if your legs get weak or numb from the local anesthetic.  Take all of your medicines the morning of the procedure with just enough water to swallow them.  If you have diabetes, make sure that you are scheduled to have your procedure done first thing in the morning, whenever possible.  If you have diabetes, take only half of your insulin dose and notify our nurse that you have done so as soon as you arrive at the clinic.  If you are diabetic, but only take blood sugar pills (oral hypoglycemic), then do not take them on the  morning of your procedure.  You may take them after you have had the procedure.  Do not take aspirin or any aspirin-containing medications, at least eleven (11) days prior to the procedure.  They may prolong bleeding.  Wear loose fitting clothing that may be easy to take off and that you would not mind if it got stained with Betadine or blood.  Do not wear any jewelry or perfume  Remove any nail coloring.  It will interfere with some of our monitoring equipment.  NOTE: Remember that this is not meant to be interpreted as a complete list of all possible complications.  Unforeseen problems may occur.  BLOOD THINNERS The following drugs contain aspirin or other products, which can cause increased bleeding during surgery and should not be taken for 2 weeks prior to  and 1 week after surgery.  If you should need take something for relief of minor pain, you may take acetaminophen which is found in Tylenol,m Datril, Anacin-3 and Panadol. It is not blood thinner. The products listed below are.  Do not take any of the products listed below in addition to any listed on your instruction sheet.  A.P.C or A.P.C with Codeine Codeine Phosphate Capsules #3 Ibuprofen Ridaura  ABC compound Congesprin Imuran rimadil  Advil Cope Indocin Robaxisal  Alka-Seltzer Effervescent Pain Reliever and Antacid Coricidin or Coricidin-D  Indomethacin Rufen  Alka-Seltzer plus Cold Medicine Cosprin Ketoprofen S-A-C Tablets  Anacin Analgesic Tablets or Capsules Coumadin Korlgesic Salflex  Anacin Extra Strength Analgesic tablets or capsules CP-2 Tablets Lanoril Salicylate  Anaprox Cuprimine Capsules Levenox Salocol  Anexsia-D Dalteparin Magan Salsalate  Anodynos Darvon compound Magnesium Salicylate Sine-off  Ansaid Dasin Capsules Magsal Sodium Salicylate  Anturane Depen Capsules Marnal Soma  APF Arthritis pain formula Dewitt's Pills Measurin Stanback  Argesic Dia-Gesic Meclofenamic Sulfinpyrazone  Arthritis Bayer Timed  Release Aspirin Diclofenac Meclomen Sulindac  Arthritis pain formula Anacin Dicumarol Medipren Supac  Analgesic (Safety coated) Arthralgen Diffunasal Mefanamic Suprofen  Arthritis Strength Bufferin Dihydrocodeine Mepro Compound Suprol  Arthropan liquid Dopirydamole Methcarbomol with Aspirin Synalgos  ASA tablets/Enseals Disalcid Micrainin Tagament  Ascriptin Doan's Midol Talwin  Ascriptin A/D Dolene Mobidin Tanderil  Ascriptin Extra Strength Dolobid Moblgesic Ticlid  Ascriptin with Codeine Doloprin or Doloprin with Codeine Momentum Tolectin  Asperbuf Duoprin Mono-gesic Trendar  Aspergum Duradyne Motrin or Motrin IB Triminicin  Aspirin plain, buffered or enteric coated Durasal Myochrisine Trigesic  Aspirin Suppositories Easprin Nalfon Trillsate  Aspirin with Codeine Ecotrin Regular or Extra Strength Naprosyn Uracel  Atromid-S Efficin Naproxen Ursinus  Auranofin Capsules Elmiron Neocylate Vanquish  Axotal Emagrin Norgesic Verin  Azathioprine Empirin or Empirin with Codeine Normiflo Vitamin E  Azolid Emprazil Nuprin Voltaren  Bayer Aspirin plain, buffered or children's or timed BC Tablets or powders Encaprin Orgaran Warfarin Sodium  Buff-a-Comp Enoxaparin Orudis Zorpin  Buff-a-Comp with Codeine Equegesic Os-Cal-Gesic   Buffaprin Excedrin plain, buffered or Extra Strength Oxalid   Bufferin Arthritis Strength Feldene Oxphenbutazone   Bufferin plain or Extra Strength Feldene Capsules Oxycodone with Aspirin   Bufferin with Codeine Fenoprofen Fenoprofen Pabalate or Pabalate-SF   Buffets II Flogesic Panagesic   Buffinol plain or Extra Strength Florinal or Florinal with Codeine Panwarfarin   Buf-Tabs Flurbiprofen Penicillamine   Butalbital Compound Four-way cold tablets Penicillin   Butazolidin Fragmin Pepto-Bismol   Carbenicillin Geminisyn Percodan   Carna Arthritis Reliever Geopen Persantine   Carprofen Gold's salt Persistin   Chloramphenicol Goody's Phenylbutazone   Chloromycetin  [place]rain Piroxlcam   Clmetidine heparin Plaquenil   Cllnoril Hyco-pap Ponstel   Clofibrate Hydroxy chloroquine Propoxyphen         Before stopping any of these medications, be sure to consult the physician who ordered them.  Some, such as Coumadin (Warfarin) are ordered to prevent or treat serious conditions such as "deep thrombosis", "pumonary embolisms", and other heart problems.  The amount of time that you may need off of the medication may also vary with the medication and the reason for which you were taking it.  If you are taking any of these medications, please make sure you notify your pain physician before you undergo any procedures.         Selective Nerve Root Block Patient Information  Description: Specific nerve roots exit the spinal canal and these nerves can be compressed and inflamed by a bulging disc and  bone spurs.  By injecting steroids on the nerve root, we can potentially decrease the inflammation surrounding these nerves, which often leads to decreased pain.  Also, by injecting local anesthesia on the nerve root, this can provide [place] helpful information to give to your referring doctor if it decreases your pain.  Selective nerve root blocks can be done along the spine from the neck to the low back depending on the location of your pain.   After numbing the skin with local anesthesia, a small needle is passed to the nerve root and the position of the needle is verified using x-ray pictures.  After the needle is in correct position, we then deposit the medication.  You may experience a pressure sensation while this is being done.  The entire block usually lasts less than 15 minutes.  Conditions that may be treated with selective nerve root blocks:  Low back and leg pain  Spinal stenosis  Diagnostic block prior to potential surgery  Neck and arm pain  Post laminectomy syndrome  Preparation for the injection:  1. Do not eat any solid food or dairy products within  8 hours of your appointment. 2. You may drink clear liquids up to 3 hours before an appointment.  Clear liquids include water, black coffee, juice or soda.  No milk or cream please. 3. You may take your regular medications, including pain medications, with a sip of water before your appointment.  Diabetics should hold regular insulin (if taken separately) and take 1/2 normal NPH dose the morning of the procedure.  Carry some sugar containing items with you to your appointment. 4. A driver must accompany you and be prepared to drive you home after your procedure. 5. Bring all your current medications with you. 6. An IV may be inserted and sedation may be given at the discretion of the physician. 7. A blood pressure cuff, EKG, and other monitors will often be applied during the procedure.  Some patients may need to have extra oxygen administered for a short period. 8. You will be asked to provide medical information, including allergies, prior to the procedure.  We must know immediately if you are taking blood  Thinners (like Coumadin) or if you are allergic to IV iodine contrast (dye).  Possible side-effects: All are usually temporary  Bleeding from needle site  Light headedness  Numbness and tingling  Decreased blood pressure  Weakness in arms/legs  Pressure sensation in back/neck  Pain at injection site (several days)  Possible complications: All are extremely rare  Infection  Nerve injury  Spinal headache (a headache wore with upright position)  Call if you experience:  Fever/chills associated with headache or increased back/neck pain  Headache worsened by an upright position  New onset weakness or numbness of an extremity below the injection site  Hives or difficulty breathing (go to the emergency room)  Inflammation or drainage at the injection site(s)  Severe back/neck pain greater than usual  New symptoms which are concerning to you  Please  note:  Although the local anesthetic injected can often make your back or neck feel good for several hours after the injection the pain will likely return.  It takes 3-5 days for steroids to work on the nerve root. You may not notice any pain relief for at least one week.  If effective, we will often do a series of 3 injections spaced 3-6 weeks apart to maximally decrease your pain.    If you have any questions, please  call [phone] [facility]

## 2016-01-30 NOTE — Progress Notes (Signed)
   Subjective:    Patient ID: [name], female    DOB: May 24, 1966, 50 y.o.   MRN: [id number]  HPI   The patient is a 50 year old female who returns to pain management for further evaluation and treatment of pain involving the neck associated with headaches as well as lower back and lower extremity pain. The patient states she has had return of her lower back pain following recent fall. The patient stated that she slipped and fell from buttocks and that she has severe pain of the lower back and lower extremity region. We discussed additional studies and interventional treatment. Decision was made to proceed with block of nerves to the sacroiliac joint at time return appointment. The patient will continue present medications and we will consider additional studies pending patient response to present treatment. All agreed to suggested treatment plan. The patient will continue hydrocodone acetaminophen at this time and we will proceed with block of nerves to the sacroiliac joint at time return appointment as discussed.   Review of Systems     Objective:   Physical Exam  There was tenderness of the splenius capitis and occipitalis musculature region a mild to moderate degree with mild to moderate tenderness over the cervical facet cervical paraspinal musculature region. Palpation of the acromioclavicular and glenohumeral joint regions reproduce mild discomfort. The patient appeared to be with slightly decreased grip strength with Tinel and Phalen's maneuver reproducing mild discomfort. Palpation over the thoracic region thoracic facet region was of muscle spasm of the lower thoracic region with no crepitus of the thoracic region noted. Bar paraspinal must reason lumbar facet region was with moderate to severe discomfort with lateral bending rotation extension and palpation of the lumbar facets reproducing severe discomfort on the left as well as on the right. There was severe tenderness of the PSIS  and PII S region as well as as the gluteal and piriformis musculature regions. Straight leg raising was limited to approximately 20 without a definite increased pain with dorsiflexion noted. There was negative clonus negative Homans. DTRs were difficult to elicit The knees were tenderness to palpation in crepitus of the knees with negative anterior and posterior drawer signs without ballottement of the patella. EHL strength appeared to be decreased. No definite sensory deficit or dermatomal dystrophy detected. Abdomen was nontender with no costovertebral angle tenderness noted.      Assessment & Plan:   Sacroiliac joint dysfunction  Greater occipital neuralgia  Degenerative disc disease of the cervical spine  Cervical facet syndrome  Degenerative disc disease of the thoracic spine  Thoracic facet syndrome  Degenerative disc disease lumbar spine     PLAN   Continue present medication hydrocodone acetaminophen  Block of nerves to the sacroiliac joint to be performed at time return appointment  F/U PCP Dr. Lennox Grumbles for evaliation of  BP diabetes mellitus and general medical  condition  F/U surgical evaluation. May consider pending follow-up evaluations  F/U neurological evaluation. May consider PNCV/EMG studies and other studies pending follow-up evaluations  May consider radiofrequency rhizolysis or intraspinal procedures pending response to present treatment and F/U evaluation   Patient to call Pain Management Center should patient have concerns prior to scheduled return appointment.   Lumbar facet syndrome  Peripheral vascular disease of the lower extremities

## 2016-01-30 NOTE — Progress Notes (Signed)
Safety precautions to be maintained throughout the outpatient stay will include: orient to surroundings, keep bed in low position, maintain call bell within reach at all times, provide assistance with transfer out of bed and ambulation.  

## 2016-02-05 ENCOUNTER — Encounter: Payer: Self-pay | Admitting: Pain Medicine

## 2016-02-05 ENCOUNTER — Ambulatory Visit: Payer: Medicare Other | Attending: Pain Medicine | Admitting: Pain Medicine

## 2016-02-05 VITALS — BP 131/98 | HR 94 | Temp 97.3°F | Resp 15 | Ht 66.0 in | Wt 167.0 lb

## 2016-02-05 DIAGNOSIS — M5137 Other intervertebral disc degeneration, lumbosacral region: Secondary | ICD-10-CM

## 2016-02-05 DIAGNOSIS — M5136 Other intervertebral disc degeneration, lumbar region: Secondary | ICD-10-CM | POA: Insufficient documentation

## 2016-02-05 DIAGNOSIS — M545 Low back pain: Secondary | ICD-10-CM | POA: Diagnosis present

## 2016-02-05 DIAGNOSIS — I739 Peripheral vascular disease, unspecified: Secondary | ICD-10-CM

## 2016-02-05 DIAGNOSIS — M47816 Spondylosis without myelopathy or radiculopathy, lumbar region: Secondary | ICD-10-CM

## 2016-02-05 DIAGNOSIS — M5481 Occipital neuralgia: Secondary | ICD-10-CM

## 2016-02-05 DIAGNOSIS — M533 Sacrococcygeal disorders, not elsewhere classified: Secondary | ICD-10-CM

## 2016-02-05 DIAGNOSIS — M791 Myalgia: Secondary | ICD-10-CM | POA: Diagnosis present

## 2016-02-05 DIAGNOSIS — M25559 Pain in unspecified hip: Secondary | ICD-10-CM

## 2016-02-05 DIAGNOSIS — M79606 Pain in leg, unspecified: Secondary | ICD-10-CM | POA: Diagnosis present

## 2016-02-05 DIAGNOSIS — M5030 Other cervical disc degeneration, unspecified cervical region: Secondary | ICD-10-CM

## 2016-02-05 MED ORDER — BUPIVACAINE HCL (PF) 0.25 % IJ SOLN
INTRAMUSCULAR | Status: AC
  Administered 2016-02-05: 13:00:00
  Filled 2016-02-05: qty 30

## 2016-02-05 MED ORDER — TRIAMCINOLONE ACETONIDE 40 MG/ML IJ SUSP: 40.0000 mg | Freq: Once | INTRAMUSCULAR | Status: DC

## 2016-02-05 MED ORDER — LACTATED RINGERS IV SOLN: 1000.0000 mL | INTRAVENOUS | Status: DC

## 2016-02-05 MED ORDER — CIPROFLOXACIN HCL 250 MG PO TABS: 250.0000 mg | ORAL_TABLET | Freq: Two times a day (BID) | ORAL | Status: DC

## 2016-02-05 MED ORDER — MIDAZOLAM HCL 5 MG/5ML IJ SOLN: 5.0000 mg | Freq: Once | INTRAMUSCULAR | Status: DC

## 2016-02-05 MED ORDER — MIDAZOLAM HCL 5 MG/5ML IJ SOLN
INTRAMUSCULAR | Status: AC
  Administered 2016-02-05: 4 mg via INTRAVENOUS
  Filled 2016-02-05: qty 5

## 2016-02-05 MED ORDER — CIPROFLOXACIN IN D5W 400 MG/200ML IV SOLN: 400.0000 mg | Freq: Once | INTRAVENOUS | Status: DC

## 2016-02-05 MED ORDER — BUPIVACAINE HCL (PF) 0.25 % IJ SOLN: 30.0000 mL | Freq: Once | INTRAMUSCULAR | Status: DC

## 2016-02-05 MED ORDER — TRIAMCINOLONE ACETONIDE 40 MG/ML IJ SUSP
INTRAMUSCULAR | Status: AC
  Administered 2016-02-05: 13:00:00
  Filled 2016-02-05: qty 1

## 2016-02-05 MED ORDER — FENTANYL CITRATE (PF) 100 MCG/2ML IJ SOLN
INTRAMUSCULAR | Status: AC
  Administered 2016-02-05: 50 ug via INTRAVENOUS
  Filled 2016-02-05: qty 2

## 2016-02-05 MED ORDER — ORPHENADRINE CITRATE 30 MG/ML IJ SOLN: 60.0000 mg | Freq: Once | INTRAMUSCULAR | Status: DC

## 2016-02-05 MED ORDER — FENTANYL CITRATE (PF) 100 MCG/2ML IJ SOLN
100.0000 ug | Freq: Once | INTRAMUSCULAR | Status: DC
Start: 2016-02-05 — End: 2016-05-07

## 2016-02-05 MED ORDER — CIPROFLOXACIN IN D5W 400 MG/200ML IV SOLN
INTRAVENOUS | Status: AC
  Administered 2016-02-05: 400 mg via INTRAVENOUS
  Filled 2016-02-05: qty 200

## 2016-02-05 NOTE — Patient Instructions (Addendum)
PLAN   Continue present medication hydrocodone acetaminophen and begin taking antibiotic Cipro today as prescribed  F/U PCP Dr. Lennox Grumbles for evaliation of  BP and general medical  condition  F/U surgical evaluation  F/U neurological evaluation. Neurological evaluation of headaches as discussed   May consider radiofrequency rhizolysis or intraspinal procedures pending response to present treatment and F/U evaluation   Patient to call [facility] should patient have concerns prior to scheduled return appointmentGENERAL RISKS AND COMPLICATIONS  What are the risk, side effects and possible complications? Generally speaking, most procedures are safe.  However, with any procedure there are risks, side effects, and the possibility of complications.  The risks and complications are dependent upon the sites that are lesioned, or the type of nerve block to be performed.  The closer the procedure is to the spine, the more serious the risks are.  Great care is taken when placing the radio frequency needles, block needles or lesioning probes, but sometimes complications can occur. 1. Infection: Any time there is an injection through the skin, there is a risk of infection.  This is why sterile conditions are used for these blocks.  There are four possible types of infection. 1. Localized skin infection. 2. Central Nervous System Infection-This can be in the form of Meningitis, which can be deadly. 3. Epidural Infections-This can be in the form of an epidural abscess, which can cause pressure inside of the spine, causing compression of the spinal cord with subsequent paralysis. This would require an emergency surgery to decompress, and there are no guarantees that the patient would recover from the paralysis. 4. Discitis-This is an infection of the intervertebral discs.  It occurs in about 1% of discography procedures.  It is difficult to treat and it may lead to surgery.        2. Pain: the needles  have to go through skin and soft tissues, will cause soreness.       3. Damage to internal structures:  The nerves to be lesioned may be near blood vessels or    other nerves which can be potentially damaged.       4. Bleeding: Bleeding is more common if the patient is taking blood thinners such as  aspirin, Coumadin, Ticiid, Plavix, etc., or if he/she have some genetic predisposition  such as hemophilia. Bleeding into the spinal canal can cause compression of the spinal  cord with subsequent paralysis.  This would require an emergency surgery to  decompress and there are no guarantees that the patient would recover from the  paralysis.       5. Pneumothorax:  Puncturing of a lung is a possibility, every time a needle is introduced in  the area of the chest or upper back.  Pneumothorax refers to free air around the  collapsed lung(s), inside of the thoracic cavity (chest cavity).  Another two possible  complications related to a similar event would include: Hemothorax and Chylothorax.   These are variations of the Pneumothorax, where instead of air around the collapsed  lung(s), you may have blood or chyle, respectively.       6. Spinal headaches: They may occur with any procedures in the area of the spine.       7. Persistent CSF (Cerebro-Spinal Fluid) leakage: This is a rare problem, but may occur  with prolonged intrathecal or epidural catheters either due to the formation of a fistulous  track or a dural tear.       8.  Nerve damage: By working so close to the spinal cord, there is always a possibility of  nerve damage, which could be as serious as a permanent spinal cord injury with  paralysis.       9. Death:  Although rare, severe deadly allergic reactions known as "Anaphylactic  reaction" can occur to any of the medications used.      10. Worsening of the symptoms:  We can always make thing worse.  What are the chances of something like this happening? Chances of any of this occuring are extremely  low.  By statistics, you have more of a chance of getting killed in a motor vehicle accident: while driving to the hospital than any of the above occurring .  Nevertheless, you should be aware that they are possibilities.  In general, it is similar to taking a shower.  Everybody knows that you can slip, hit your head and get killed.  Does that mean that you should not shower again?  Nevertheless always keep in mind that statistics do not mean anything if you happen to be on the wrong side of them.  Even if a procedure has a 1 (one) in a 1,000,000 (million) chance of going wrong, it you happen to be that one..Also, keep in mind that by statistics, you have more of a chance of having something go wrong when taking medications.  Who should not have this procedure? If you are on a blood thinning medication (e.g. Coumadin, Plavix, see list of "Blood Thinners"), or if you have an active infection going on, you should not have the procedure.  If you are taking any blood thinners, please inform your physician.  How should I prepare for this procedure?  Do not eat or drink anything at least six hours prior to the procedure.  Bring a driver with you .  It cannot be a taxi.  Come accompanied by an adult that can drive you back, and that is strong enough to help you if your legs get weak or numb from the local anesthetic.  Take all of your medicines the morning of the procedure with just enough water to swallow them.  If you have diabetes, make sure that you are scheduled to have your procedure done first thing in the morning, whenever possible.  If you have diabetes, take only half of your insulin dose and notify our nurse that you have done so as soon as you arrive at the clinic.  If you are diabetic, but only take blood sugar pills (oral hypoglycemic), then do not take them on the morning of your procedure.  You may take them after you have had the procedure.  Do not take aspirin or any  aspirin-containing medications, at least eleven (11) days prior to the procedure.  They may prolong bleeding.  Wear loose fitting clothing that may be easy to take off and that you would not mind if it got stained with Betadine or blood.  Do not wear any jewelry or perfume  Remove any nail coloring.  It will interfere with some of our monitoring equipment.  NOTE: Remember that this is not meant to be interpreted as a complete list of all possible complications.  Unforeseen problems may occur.  BLOOD THINNERS The following drugs contain aspirin or other products, which can cause increased bleeding during surgery and should not be taken for 2 weeks prior to and 1 week after surgery.  If you should need take something for relief of minor pain,  you may take acetaminophen which is found in Tylenol,m Datril, Anacin-3 and Panadol. It is not blood thinner. The products listed below are.  Do not take any of the products listed below in addition to any listed on your instruction sheet.  A.P.C or A.P.C with Codeine Codeine Phosphate Capsules #3 Ibuprofen Ridaura  ABC compound Congesprin Imuran rimadil  Advil Cope Indocin Robaxisal  Alka-Seltzer Effervescent Pain Reliever and Antacid Coricidin or Coricidin-D  Indomethacin Rufen  Alka-Seltzer plus Cold Medicine Cosprin Ketoprofen S-A-C Tablets  Anacin Analgesic Tablets or Capsules Coumadin Korlgesic Salflex  Anacin Extra Strength Analgesic tablets or capsules CP-2 Tablets Lanoril Salicylate  Anaprox Cuprimine Capsules Levenox Salocol  Anexsia-D Dalteparin Magan Salsalate  Anodynos Darvon compound Magnesium Salicylate Sine-off  Ansaid Dasin Capsules Magsal Sodium Salicylate  Anturane Depen Capsules Marnal Soma  APF Arthritis pain formula Dewitt's Pills Measurin Stanback  Argesic Dia-Gesic Meclofenamic Sulfinpyrazone  Arthritis Bayer Timed Release Aspirin Diclofenac Meclomen Sulindac  Arthritis pain formula Anacin Dicumarol Medipren Supac  Analgesic  (Safety coated) Arthralgen Diffunasal Mefanamic Suprofen  Arthritis Strength Bufferin Dihydrocodeine Mepro Compound Suprol  Arthropan liquid Dopirydamole Methcarbomol with Aspirin Synalgos  ASA tablets/Enseals Disalcid Micrainin Tagament  Ascriptin Doan's Midol Talwin  Ascriptin A/D Dolene Mobidin Tanderil  Ascriptin Extra Strength Dolobid Moblgesic Ticlid  Ascriptin with Codeine Doloprin or Doloprin with Codeine Momentum Tolectin  Asperbuf Duoprin Mono-gesic Trendar  Aspergum Duradyne Motrin or Motrin IB Triminicin  Aspirin plain, buffered or enteric coated Durasal Myochrisine Trigesic  Aspirin Suppositories Easprin Nalfon Trillsate  Aspirin with Codeine Ecotrin Regular or Extra Strength Naprosyn Uracel  Atromid-S Efficin Naproxen Ursinus  Auranofin Capsules Elmiron Neocylate Vanquish  Axotal Emagrin Norgesic Verin  Azathioprine Empirin or Empirin with Codeine Normiflo Vitamin E  Azolid Emprazil Nuprin Voltaren  Bayer Aspirin plain, buffered or children's or timed BC Tablets or powders Encaprin Orgaran Warfarin Sodium  Buff-a-Comp Enoxaparin Orudis Zorpin  Buff-a-Comp with Codeine Equegesic Os-Cal-Gesic   Buffaprin Excedrin plain, buffered or Extra Strength Oxalid   Bufferin Arthritis Strength Feldene Oxphenbutazone   Bufferin plain or Extra Strength Feldene Capsules Oxycodone with Aspirin   Bufferin with Codeine Fenoprofen Fenoprofen Pabalate or Pabalate-SF   Buffets II Flogesic Panagesic   Buffinol plain or Extra Strength Florinal or Florinal with Codeine Panwarfarin   Buf-Tabs Flurbiprofen Penicillamine   Butalbital Compound Four-way cold tablets Penicillin   Butazolidin Fragmin Pepto-Bismol   Carbenicillin Geminisyn Percodan   Carna Arthritis Reliever Geopen Persantine   Carprofen Gold's salt Persistin   Chloramphenicol Goody's Phenylbutazone   Chloromycetin Haltrain Piroxlcam   Clmetidine heparin Plaquenil   Cllnoril Hyco-pap Ponstel   Clofibrate Hydroxy chloroquine  Propoxyphen         Before stopping any of these medications, be sure to consult the physician who ordered them.  Some, such as Coumadin (Warfarin) are ordered to prevent or treat serious conditions such as "deep thrombosis", "pumonary embolisms", and other heart problems.  The amount of time that you may need off of the medication may also vary with the medication and the reason for which you were taking it.  If you are taking any of these medications, please make sure you notify your pain physician before you undergo any procedures.

## 2016-02-05 NOTE — Progress Notes (Signed)
Patient here for procedure d/t back and leg pain Safety precautions to be maintained throughout the outpatient stay will include: orient to surroundings, keep bed in low position, maintain call bell within reach at all times, provide assistance with transfer out of bed and ambulation.

## 2016-02-05 NOTE — Progress Notes (Signed)
Subjective:    Patient ID: [name], female    DOB: 05/28/1966, 50 y.o.   MRN: [id number]  HPI  PROCEDURE:  Block of nerves to the sacroiliac joint.   NOTE:  The patient is a 50 y.o. female who [facility] the [facility] for further evaluation and treatment of pain involving the lower back and lower extremity region with pain in the region of the buttocks as well. 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 The patient is with reproduction of severe pain with palpation over the PSIS NP IIS regions.   There is concern regarding a significant component of the patient's pain being due to sacroiliac joint dysfunction The risks, benefits, expectations of the procedure have been discussed and explained to the patient who is understanding and willing to proceed with interventional treatment in attempt to decrease severity of patient's symptoms, minimize the risk of medication escalation and  hopefully retard the progression of the patient's symptoms. We will proceed with what is felt to be a medically necessary procedure, block of nerves to the sacroiliac joint.   DESCRIPTION OF PROCEDURE:  Block of nerves to the sacroiliac joint.   The patient was taken to the fluoroscopy suite. With the patient in the prone position with EKG, blood pressure, pulse and pulse oximetry monitoring, IV Versed, IV fentanyl conscious sedation, Betadine prep of proposed entry site was performed.   Block of nerves at the L5 vertebral body level.   With the patient in prone position, under fluoroscopic guidance, a 22 -gauge needle was inserted at the L5 vertebral body level on the left side. With 15 degrees oblique orientation a 22 -gauge needle was inserted in the region known as Burton's eye or eye of the Scotty dog. Following documentation of needle placement in the area of Burton's eye or eye of the Scotty dog under fluoroscopic guidance, needle placement was then accomplished at the  sacral ala level on the left side.   Needle placement at the sacral ala.   With the patient in prone position under fluoroscopic guidance with AP view of the lumbosacral spine, a 22 -gauge needle was inserted in the region known as the sacral ala on the left side. Following documentation of needle placement on the left side under fluoroscopic guidance needle placement was then accomplished at the S1 foramen level.   Needle placement at the S1 foramen level.   With the patient in prone position under fluoroscopic guidance with AP view of the lumbosacral spine and cephalad orientation, a 22 -gauge needle was inserted at the superior and lateral border of the S1 foramen on the left side. Following documentation of needle placement at the S1 foramen level on the left side, needle placement was then accomplished at the S2 foramen level on the left side.   Needle placement at the S2 foramen level.   With the patient in prone position with AP view of the lumbosacral spine with cephalad orientation, a 22 - gauge needle was inserted at the superior and lateral border of the S2 foramen under fluoroscopic guidance on the left side. Following needle placement at the L5 vertebral body level, sacral ala, S1 foramen and S2 foramen on the left side, needle placement was verified on lateral view under fluoroscopic guidance.  Following needle placement documentation on lateral view, each needle was injected with 1 mL of 0.25% bupivacaine and Kenalog.   BLOCK OF THE NERVES TO SACROILIAC JOINT ON THE RIGHT SIDE The  procedure was performed on the right side at the same levels as was performed on the left side and utilizing the same technique as on the left side and was performed under fluoroscopic guidance as on the left side   A total of 10mg  of Kenalog was utilized for the procedure.   PLAN:  1. Medications: The patient will continue presently prescribed medication hydrocodone acetaminophen  2. The patient will  be considered for modification of treatment regimen pending response to the procedure performed on today's visit.  3. The patient is to follow-up with primary care physician Dr. Lennox Grumbles for evaluation of blood pressure and general medical condition following the procedure performed on today's visit.  4. Surgical evaluation as discussed.  5. Neurological evaluation as discussed.  6. The patient may be a candidate for radiofrequency procedures, implantation devices and other treatment pending response to treatment performed on today's visit and follow-up evaluation.  7. The patient has been advised to adhere to proper body mechanics and to avoid activities which may exacerbate the patient's symptoms.   Return appointment to [facility] as scheduled.    Review of Systems     Objective:   Physical Exam        Assessment & Plan:

## 2016-02-06 ENCOUNTER — Telehealth: Payer: Self-pay | Admitting: *Deleted

## 2016-02-06 NOTE — Telephone Encounter (Signed)
No problems post procedure phone call. 

## 2016-02-09 ENCOUNTER — Other Ambulatory Visit: Payer: Self-pay | Admitting: Pain Medicine

## 2016-02-16 ENCOUNTER — Encounter: Payer: Self-pay | Admitting: Family

## 2016-02-26 ENCOUNTER — Ambulatory Visit (HOSPITAL_COMMUNITY)
Admission: RE | Admit: 2016-02-26 | Discharge: 2016-02-26 | Disposition: A | Discharge status: Home / Self Care | Payer: Medicare Other | Source: Ambulatory Visit | Attending: Family | Admitting: Family

## 2016-02-26 ENCOUNTER — Ambulatory Visit (INDEPENDENT_AMBULATORY_CARE_PROVIDER_SITE_OTHER): Payer: Medicare Other | Admitting: Family

## 2016-02-26 ENCOUNTER — Encounter: Payer: Self-pay | Admitting: Family

## 2016-02-26 ENCOUNTER — Ambulatory Visit (INDEPENDENT_AMBULATORY_CARE_PROVIDER_SITE_OTHER)
Admission: RE | Admit: 2016-02-26 | Discharge: 2016-02-26 | Disposition: A | Discharge status: Home / Self Care | Payer: Medicare Other | Source: Ambulatory Visit | Attending: Family | Admitting: Family

## 2016-02-26 VITALS — BP 162/105 | HR 95 | Ht 66.0 in | Wt 165.5 lb

## 2016-02-26 DIAGNOSIS — I779 Disorder of arteries and arterioles, unspecified: Secondary | ICD-10-CM

## 2016-02-26 DIAGNOSIS — I748 Embolism and thrombosis of other arteries: Secondary | ICD-10-CM | POA: Diagnosis not present

## 2016-02-26 DIAGNOSIS — I708 Atherosclerosis of other arteries: Secondary | ICD-10-CM | POA: Insufficient documentation

## 2016-02-26 DIAGNOSIS — Z720 Tobacco use: Secondary | ICD-10-CM

## 2016-02-26 DIAGNOSIS — F17200 Nicotine dependence, unspecified, uncomplicated: Secondary | ICD-10-CM

## 2016-02-26 DIAGNOSIS — E1151 Type 2 diabetes mellitus with diabetic peripheral angiopathy without gangrene: Secondary | ICD-10-CM | POA: Insufficient documentation

## 2016-02-26 DIAGNOSIS — Z959 Presence of cardiac and vascular implant and graft, unspecified: Secondary | ICD-10-CM

## 2016-02-26 DIAGNOSIS — E785 Hyperlipidemia, unspecified: Secondary | ICD-10-CM | POA: Insufficient documentation

## 2016-02-26 DIAGNOSIS — I6523 Occlusion and stenosis of bilateral carotid arteries: Secondary | ICD-10-CM

## 2016-02-26 DIAGNOSIS — R0989 Other specified symptoms and signs involving the circulatory and respiratory systems: Secondary | ICD-10-CM

## 2016-02-26 DIAGNOSIS — Z95828 Presence of other vascular implants and grafts: Secondary | ICD-10-CM | POA: Diagnosis not present

## 2016-02-26 NOTE — Patient Instructions (Signed)
Stroke Prevention Some medical conditions and behaviors are associated with an increased chance of having a stroke. You may prevent a stroke by making healthy choices and managing medical conditions. HOW CAN I REDUCE MY RISK OF HAVING A STROKE?   Stay physically active. Get at least 30 minutes of activity on most or all days.  Do not smoke. It may also be helpful to avoid exposure to secondhand smoke.  Limit alcohol use. Moderate alcohol use is considered to be:  No more than 2 drinks per day for men.  No more than 1 drink per day for nonpregnant women.  Eat healthy foods. This involves:  Eating 5 or more servings of fruits and vegetables a day.  Making dietary changes that address high blood pressure (hypertension), high cholesterol, diabetes, or obesity.  Manage your cholesterol levels.  Making food choices that are high in fiber and low in saturated fat, trans fat, and cholesterol may control cholesterol levels.  Take any prescribed medicines to control cholesterol as directed by your health care provider.  Manage your diabetes.  Controlling your carbohydrate and sugar intake is recommended to manage diabetes.  Take any prescribed medicines to control diabetes as directed by your health care provider.  Control your hypertension.  Making food choices that are low in salt (sodium), saturated fat, trans fat, and cholesterol is recommended to manage hypertension.  Ask your health care provider if you need treatment to lower your blood pressure. Take any prescribed medicines to control hypertension as directed by your health care provider.  If you are 18-39 years of age, have your blood pressure checked every 3-5 years. If you are 40 years of age or older, have your blood pressure checked every year.  Maintain a healthy weight.  Reducing calorie intake and making food choices that are low in sodium, saturated fat, trans fat, and cholesterol are recommended to manage  weight.  Stop drug abuse.  Avoid taking birth control pills.  Talk to your health care provider about the risks of taking birth control pills if you are over 35 years old, smoke, get migraines, or have ever had a blood clot.  Get evaluated for sleep disorders (sleep apnea).  Talk to your health care provider about getting a sleep evaluation if you snore a lot or have excessive sleepiness.  Take medicines only as directed by your health care provider.  For some people, aspirin or blood thinners (anticoagulants) are helpful in reducing the risk of forming abnormal blood clots that can lead to stroke. If you have the irregular heart rhythm of atrial fibrillation, you should be on a blood thinner unless there is a good reason you cannot take them.  Understand all your medicine instructions.  Make sure that other conditions (such as anemia or atherosclerosis) are addressed. SEEK IMMEDIATE MEDICAL CARE IF:   You have sudden weakness or numbness of the face, arm, or leg, especially on one side of the body.  Your face or eyelid droops to one side.  You have sudden confusion.  You have trouble speaking (aphasia) or understanding.  You have sudden trouble seeing in one or both eyes.  You have sudden trouble walking.  You have dizziness.  You have a loss of balance or coordination.  You have a sudden, severe headache with no known cause.  You have new chest pain or an irregular heartbeat. Any of these symptoms may represent a serious problem that is an emergency. Do not wait to see if the symptoms will   go away. Get medical help at once. Call your local emergency services (911 in [place] Do not drive yourself to the hospital.   This information is not intended to replace advice given to you by your health care provider. Make sure you discuss any questions you have with your health care provider.   Document Released: 12/12/2004 Document Revised: 11/25/2014 Document Reviewed:  05/07/2013 Elsevier Interactive Patient Education 2016 [employer]    Peripheral Vascular Disease Peripheral vascular disease (PVD) is a disease of the blood vessels that are not part of your heart and brain. A simple term for PVD is poor circulation. In most cases, PVD narrows the blood vessels that carry blood from your heart to the rest of your body. This can result in a decreased supply of blood to your arms, legs, and internal organs, like your stomach or kidneys. However, it most often affects a person's lower legs and feet. There are two types of PVD.  Organic PVD. This is the more common type. It is caused by damage to the structure of blood vessels.  Functional PVD. This is caused by conditions that make blood vessels contract and tighten (spasm). Without treatment, PVD tends to get worse over time. PVD can also lead to acute ischemic limb. This is when an arm or limb suddenly has trouble getting enough blood. This is a medical emergency.  HOME CARE  Take medicines only as told by your doctor.  Do not use any tobacco products, including cigarettes, chewing tobacco, or electronic cigarettes. If you need help quitting, ask your doctor.  Lose weight if you are overweight, and maintain a healthy weight as told by your doctor.  Eat a diet that is low in fat and cholesterol. If you need help, ask your doctor.  Exercise regularly. Ask your doctor for some good activities for you.  Take good care of your feet.  Wear comfortable shoes that fit well.  Check your feet often for any cuts or sores. GET HELP IF:  You have cramps in your legs while walking.  You have leg pain when you are at rest.  You have coldness in a leg or foot.  Your skin changes.  You are unable to get or have an erection (erectile dysfunction).  You have cuts or sores on your feet that are not healing. GET HELP RIGHT AWAY IF:  Your arm or leg turns cold and blue.  Your arms or legs become red,  warm, swollen, painful, or numb.  You have chest pain or trouble breathing.  You suddenly have weakness in your face, arm, or leg.  You become very confused or you cannot speak.  You suddenly have a very bad headache.  You suddenly cannot see.   This information is not intended to replace advice given to you by your health care provider. Make sure you discuss any questions you have with your health care provider.   Document Released: 01/29/2010 Document Revised: 11/25/2014 Document Reviewed: 04/14/2014 Elsevier Interactive Patient Education [employer].

## 2016-02-26 NOTE — Progress Notes (Signed)
[facility] [place] HISTORY AND PHYSICAL   MRN : [id number]  History of Present Illness:   [name] is a 50 y.o. female  patient of Dr. Trula Slade who returns today for follow up. She is status post right femoral to below knee popliteal artery bypass graft with vein on 07/05/2010 for nonhealing ulcer, and left femoral to below knee popliteal artery bypass graft with vein on 06/27/2011 . She had a high-grade stenosis identified at the proximal anastomosis on the right and on 06/15/2014 she underwent drug coated balloon angioplasty with a 6 mm balloon.Follow-up ultrasound revealed a recurrence of the stenosis and she was taken back for angiography on 11/22/2014. She had a recurrent high-grade stenosis within the proximal bypass graft. A dissection was created after balloon angioplasty that did not respond to repeat angioplasty therefore a 6 x 30 self-expanding stent was deployed on 11/22/14.   She denies having any open wounds. She has moderate intermittent claudication in both calves.  She did fall from a vertigo attack and landed on a steel toed boots in her back surgery spot has had back pain ever sense. She is only smoking 5 cigarettes/day now.  She has known back issues, had spine surgery x2, has pain in her legs at rest and with walking.  She is receiving some injections in her c-spine and lumbar spine which temporarily help her pain.   Pt states she has never had a stroke or TIA. She denies pain or cold sensation in either upper extremity, but does c/o numbness in both which she attribute to carpal tunnel syndrome.  Her PCP is Dr. Delight Stare, [place]. Intermittent diarrhea/constipation, does not use metformin, states she was told that she has IBS.  She is being seen in [facility], Dr. Primus Bravo. Pain clinic, received injections in her back recently which helped her midback but not her lower back and has not improved her tense neck pain. She states she  has sciatic issues, pain shooting down both hips.  Pt Diabetic: Yes, pt states has worsened to around 250 glucose, states she is working closely with her PCP Pt smoker: smoker (3 cigarettes/day, started smoking at age 29 or 19 yrs)  Pt meds include: Statin: No, Crestor caused itching and generalized redness Betablocker: No ASA: 81 mg enteric coated daily Other anticoagulants/antiplatelets: no    Current Outpatient Prescriptions  Medication Sig Dispense Refill  . ACCU-CHEK AVIVA PLUS test strip     . ACCU-CHEK SOFTCLIX LANCETS lancets     . albuterol (PROVENTIL HFA;VENTOLIN HFA) 108 (90 Base) MCG/ACT inhaler Inhale 2 puffs into the lungs as needed for wheezing or shortness of breath.    Marland Kitchen albuterol (PROVENTIL) 2 MG tablet Take 2 mg by mouth 3 (three) times daily.     Marland Kitchen aspirin 81 MG tablet Take 81 mg by mouth daily.    . Blood Glucose Monitoring Suppl (ACCU-CHEK AVIVA PLUS) w/Device KIT     . cetirizine (ZYRTEC) 10 MG tablet Take 10 mg by mouth daily.    . Choline Fenofibrate (FENOFIBRIC ACID) 45 MG CPDR     . ciprofloxacin (CIPRO) 250 MG tablet Take 1 tablet (250 mg total) by mouth 2 (two) times daily. 14 tablet 0  . cyclobenzaprine (FLEXERIL) 10 MG tablet Take 10 mg by mouth 3 (three) times daily as needed for muscle spasms. Reported on 11/22/2015    . doxycycline (VIBRA-TABS) 100 MG tablet Reported on 02/05/2016    . furosemide (LASIX) 40 MG tablet Take 40  mg by mouth 2 (two) times daily as needed for fluid.     Marland Kitchen HUMALOG 100 UNIT/ML injection     . HUMALOG KWIKPEN 100 UNIT/ML KiwkPen     . HYDROcodone-acetaminophen (NORCO) 10-325 MG tablet Limit 1 tablet by mouth per day or twice per day if tolerated 60 tablet 0  . insulin aspart (NOVOLOG) 100 UNIT/ML injection Inject 10 Units into the skin 3 (three) times daily before meals. Reported on 02/05/2016    . LANTUS SOLOSTAR 100 UNIT/ML Solostar Pen Inject 60 Units into the skin at bedtime.     Marland Kitchen loperamide (IMODIUM A-D) 2 MG tablet Take 2  mg by mouth as needed for diarrhea or loose stools.    . meclizine (ANTIVERT) 25 MG tablet Take 25 mg by mouth 3 (three) times daily as needed for dizziness.    Marland Kitchen omeprazole (PRILOSEC) 20 MG capsule Take 20 mg by mouth daily.    . pregabalin (LYRICA) 100 MG capsule Take 100 mg by mouth 3 (three) times daily.    . pseudoephedrine-guaifenesin (MUCINEX D) 60-600 MG per tablet Take 1 tablet by mouth every 12 (twelve) hours as needed for congestion. Reported on 02/05/2016    . simethicone (MYLICON) 673 MG chewable tablet Chew 125 mg by mouth every 6 (six) hours as needed for flatulence.    . Cholecalciferol (VITAMIN D3) 50000 units CAPS Take 50,000 Units by mouth once a week. Reported on 02/26/2016    . ciprofloxacin (CIPRO) 250 MG tablet Take 1 tablet (250 mg total) by mouth 2 (two) times daily. (Patient not taking: Reported on 01/03/2016) 14 tablet 0  . naproxen sodium (ANAPROX) 220 MG tablet Take 220 mg by mouth 2 (two) times daily as needed (pain). Reported on 02/26/2016    . Vitamin D, Ergocalciferol, (DRISDOL) 50000 UNITS CAPS capsule Take 50,000 Units by mouth every 7 (seven) days. Reported on 02/26/2016     Current Facility-Administered Medications  Medication Dose Route Frequency Provider Last Rate Last Dose  . bupivacaine (PF) (MARCAINE) 0.25 % injection 30 mL  30 mL Other Once Mohammed Kindle, MD      . ciprofloxacin (CIPRO) IVPB 400 mg  400 mg Intravenous Once Mohammed Kindle, MD      . ciprofloxacin (CIPRO) IVPB 400 mg  400 mg Intravenous Once Mohammed Kindle, MD      . ciprofloxacin (CIPRO) tablet 250 mg  250 mg Oral BID Mohammed Kindle, MD      . fentaNYL (SUBLIMAZE) injection 100 mcg  100 mcg Intravenous Once Mohammed Kindle, MD      . fentaNYL (SUBLIMAZE) injection 100 mcg  100 mcg Intravenous Once Mohammed Kindle, MD      . lactated ringers infusion 1,000 mL  1,000 mL Intravenous Continuous Mohammed Kindle, MD      . lactated ringers infusion 1,000 mL  1,000 mL Intravenous Continuous Mohammed Kindle, MD       . lactated ringers infusion 1,000 mL  1,000 mL Intravenous Continuous Mohammed Kindle, MD      . lidocaine (PF) (XYLOCAINE) 1 % injection 10 mL  10 mL Subcutaneous Once Mohammed Kindle, MD      . lidocaine (PF) (XYLOCAINE) 1 % injection 10 mL  10 mL Subcutaneous Once Mohammed Kindle, MD      . midazolam (VERSED) 5 MG/5ML injection 5 mg  5 mg Intravenous Once Mohammed Kindle, MD      . midazolam (VERSED) 5 MG/5ML injection 5 mg  5 mg Intravenous Once Mohammed Kindle, MD      .  orphenadrine (NORFLEX) injection 60 mg  60 mg Intramuscular Once Mohammed Kindle, MD      . orphenadrine (NORFLEX) injection 60 mg  60 mg Intramuscular Once Mohammed Kindle, MD      . orphenadrine (NORFLEX) injection 60 mg  60 mg Intramuscular Once Mohammed Kindle, MD      . sodium chloride 0.9 % injection 20 mL  20 mL Other Once Mohammed Kindle, MD      . sodium chloride 0.9 % injection 20 mL  20 mL Other Once Mohammed Kindle, MD      . triamcinolone acetonide [facility]) injection 40 mg  40 mg Other Once Mohammed Kindle, MD      . triamcinolone acetonide [facility]) injection 40 mg  40 mg Other Once Mohammed Kindle, MD       Facility-Administered Medications Ordered in Other Visits  Medication Dose Route Frequency Provider Last Rate Last Dose  . 0.9 %  sodium chloride infusion   Intravenous Continuous Serafina Mitchell, MD        Past Medical History  Diagnosis Date  . Diabetes mellitus   . PAD (peripheral artery disease) ([place])   . Chronic back pain   . Asthma   . Hyperlipidemia   . Leg pain     With Walking  . Depression   . Arthritis   . Ulcer     Foot  . Reflux   . Joint pain   . Peripheral arterial disease [facility])     Social History Social History  Substance Use Topics  . Smoking status: Current Every Day Smoker -- 0.20 packs/day for 30 years    Types: Cigarettes  . Smokeless tobacco: Never Used     Comment: 5 cigarrettes a day  . Alcohol Use: No    Family History Family History  Problem Relation Age of Onset   . Coronary artery disease Mother   . Peripheral vascular disease Mother   . Heart disease Mother     Before age 87  . Other Mother     Venous insuffiency  . Diabetes Mother   . Hyperlipidemia Mother   . Hypertension Mother   . Varicose Veins Mother   . Heart attack Mother     before age 18  . Heart disease Father   . Diabetes Father   . Diabetes Sister   . Hypertension Sister   . Diabetes Brother   . Hypertension Brother   . Diabetes Maternal Grandmother   . Diabetes Paternal Grandmother   . Diabetes Paternal Grandfather     Surgical History Past Surgical History  Procedure Laterality Date  . Arterial bypass surgry  07/05/2010    Right Common Femoral to below knee popliteal BPG  . Skin graft Right 2012    [name] by Dr. Nils Pyle- Right and Left Ankle  . Back surgery      X's  2  . Tonsillectomy    . Spine surgery    . Cholecystectomy      Gall Bladder  . Abdominal aortagram  June 15, 2014  . Intercostal nerve block  November 2015  . Abdominal aortagram N/A 06/15/2014    Procedure: ABDOMINAL Maxcine Ham;  Surgeon: Serafina Mitchell, MD;  Location: [facility] CATH LAB;  Service: Cardiovascular;  Laterality: N/A;  . Abdominal aortagram N/A 11/22/2014    Procedure: ABDOMINAL AORTAGRAM;  Surgeon: Serafina Mitchell, MD;  Location: [facility] CATH LAB;  Service: Cardiovascular;  Laterality: N/A;  . Cystectomy Right     foot  .  Cystectomy Left     wrist  . Left foot surgery    . Left wrist cyst removal Left     Allergies  Allergen Reactions  . Penicillins Other (See Comments)    Severe Headache    Current Outpatient Prescriptions  Medication Sig Dispense Refill  . ACCU-CHEK AVIVA PLUS test strip     . ACCU-CHEK SOFTCLIX LANCETS lancets     . albuterol (PROVENTIL HFA;VENTOLIN HFA) 108 (90 Base) MCG/ACT inhaler Inhale 2 puffs into the lungs as needed for wheezing or shortness of breath.    Marland Kitchen albuterol (PROVENTIL) 2 MG tablet Take 2 mg by mouth 3 (three) times daily.     Marland Kitchen aspirin 81 MG tablet  Take 81 mg by mouth daily.    . Blood Glucose Monitoring Suppl (ACCU-CHEK AVIVA PLUS) w/Device KIT     . cetirizine (ZYRTEC) 10 MG tablet Take 10 mg by mouth daily.    . Choline Fenofibrate (FENOFIBRIC ACID) 45 MG CPDR     . ciprofloxacin (CIPRO) 250 MG tablet Take 1 tablet (250 mg total) by mouth 2 (two) times daily. 14 tablet 0  . cyclobenzaprine (FLEXERIL) 10 MG tablet Take 10 mg by mouth 3 (three) times daily as needed for muscle spasms. Reported on 11/22/2015    . doxycycline (VIBRA-TABS) 100 MG tablet Reported on 02/05/2016    . furosemide (LASIX) 40 MG tablet Take 40 mg by mouth 2 (two) times daily as needed for fluid.     Marland Kitchen HUMALOG 100 UNIT/ML injection     . HUMALOG KWIKPEN 100 UNIT/ML KiwkPen     . HYDROcodone-acetaminophen (NORCO) 10-325 MG tablet Limit 1 tablet by mouth per day or twice per day if tolerated 60 tablet 0  . insulin aspart (NOVOLOG) 100 UNIT/ML injection Inject 10 Units into the skin 3 (three) times daily before meals. Reported on 02/05/2016    . LANTUS SOLOSTAR 100 UNIT/ML Solostar Pen Inject 60 Units into the skin at bedtime.     Marland Kitchen loperamide (IMODIUM A-D) 2 MG tablet Take 2 mg by mouth as needed for diarrhea or loose stools.    . meclizine (ANTIVERT) 25 MG tablet Take 25 mg by mouth 3 (three) times daily as needed for dizziness.    Marland Kitchen omeprazole (PRILOSEC) 20 MG capsule Take 20 mg by mouth daily.    . pregabalin (LYRICA) 100 MG capsule Take 100 mg by mouth 3 (three) times daily.    . pseudoephedrine-guaifenesin (MUCINEX D) 60-600 MG per tablet Take 1 tablet by mouth every 12 (twelve) hours as needed for congestion. Reported on 02/05/2016    . simethicone (MYLICON) 703 MG chewable tablet Chew 125 mg by mouth every 6 (six) hours as needed for flatulence.    . Cholecalciferol (VITAMIN D3) 50000 units CAPS Take 50,000 Units by mouth once a week. Reported on 02/26/2016    . ciprofloxacin (CIPRO) 250 MG tablet Take 1 tablet (250 mg total) by mouth 2 (two) times daily. (Patient not  taking: Reported on 01/03/2016) 14 tablet 0  . naproxen sodium (ANAPROX) 220 MG tablet Take 220 mg by mouth 2 (two) times daily as needed (pain). Reported on 02/26/2016    . Vitamin D, Ergocalciferol, (DRISDOL) 50000 UNITS CAPS capsule Take 50,000 Units by mouth every 7 (seven) days. Reported on 02/26/2016     Current Facility-Administered Medications  Medication Dose Route Frequency Provider Last Rate Last Dose  . bupivacaine (PF) (MARCAINE) 0.25 % injection 30 mL  30 mL Other Once Mohammed Kindle, MD      .  ciprofloxacin (CIPRO) IVPB 400 mg  400 mg Intravenous Once Mohammed Kindle, MD      . ciprofloxacin (CIPRO) IVPB 400 mg  400 mg Intravenous Once Mohammed Kindle, MD      . ciprofloxacin (CIPRO) tablet 250 mg  250 mg Oral BID Mohammed Kindle, MD      . fentaNYL (SUBLIMAZE) injection 100 mcg  100 mcg Intravenous Once Mohammed Kindle, MD      . fentaNYL (SUBLIMAZE) injection 100 mcg  100 mcg Intravenous Once Mohammed Kindle, MD      . lactated ringers infusion 1,000 mL  1,000 mL Intravenous Continuous Mohammed Kindle, MD      . lactated ringers infusion 1,000 mL  1,000 mL Intravenous Continuous Mohammed Kindle, MD      . lactated ringers infusion 1,000 mL  1,000 mL Intravenous Continuous Mohammed Kindle, MD      . lidocaine (PF) (XYLOCAINE) 1 % injection 10 mL  10 mL Subcutaneous Once Mohammed Kindle, MD      . lidocaine (PF) (XYLOCAINE) 1 % injection 10 mL  10 mL Subcutaneous Once Mohammed Kindle, MD      . midazolam (VERSED) 5 MG/5ML injection 5 mg  5 mg Intravenous Once Mohammed Kindle, MD      . midazolam (VERSED) 5 MG/5ML injection 5 mg  5 mg Intravenous Once Mohammed Kindle, MD      . orphenadrine (NORFLEX) injection 60 mg  60 mg Intramuscular Once Mohammed Kindle, MD      . orphenadrine (NORFLEX) injection 60 mg  60 mg Intramuscular Once Mohammed Kindle, MD      . orphenadrine (NORFLEX) injection 60 mg  60 mg Intramuscular Once Mohammed Kindle, MD      . sodium chloride 0.9 % injection 20 mL  20 mL Other Once Mohammed Kindle, MD      . sodium chloride 0.9 % injection 20 mL  20 mL Other Once Mohammed Kindle, MD      . triamcinolone acetonide [facility]) injection 40 mg  40 mg Other Once Mohammed Kindle, MD      . triamcinolone acetonide (KENALOG-40) injection 40 mg  40 mg Other Once Mohammed Kindle, MD       Facility-Administered Medications Ordered in Other Visits  Medication Dose Route Frequency Provider Last Rate Last Dose  . 0.9 %  sodium chloride infusion   Intravenous Continuous Serafina Mitchell, MD         REVIEW OF SYSTEMS: See HPI for pertinent positives and negatives.  Physical Examination Filed Vitals:   02/26/16 1350 02/26/16 1352  BP: 90/68 162/105  Pulse: 95   Height: _0  (1.676 m)   Weight: 165 lb 8 oz (75.07 kg)   SpO2: 100%    Body mass index is 26.73 kg/(m^2).  General: A&O x 3, WDWN. Gait: normal Eyes: PERRLA. Pulmonary: CTAB, without wheezes , rales or rhonchi. Cardiac: regular rhythm, no detected murmur.     Carotid Bruits Left Right   positive posative  Aorta is not palpable. Radial pulses: are not palpable, brachial pulses are not palpable. Capillary refill in fingers of both hands is adequate, fingers are pink   VASCULAR EXAM: Extremities without ischemic changes  without Gangrene; without open wounds.     LE Pulses LEFT RIGHT   AORTA Not palpable N/A   FEMORAL 2+ palpable 2+ palpable    POPLITEAL not palpable  not palpable   POSTERIOR TIBIAL not palpable  not palpable    DORSALIS PEDIS  ANTERIOR TIBIAL 2+  palpable  not palpable    Abdomen: soft, NT, no masses palpated. Skin: no rashes, no ulcers. Musculoskeletal: no muscle wasting or atrophy.Bialteral Dupetryn's contractures.   Neurologic: A&O X 3; Appropriate Affect ; SENSATION: normal; MOTOR FUNCTION: moving all extremities equally, motor strength 3/5 throughout. Speech is fluent/normal. CN 2-12 intact.                Non-Invasive Vascular Imaging (02/26/2016):   Bilateral LE arterial Duplex: Right LE waveforms are all monophasic, likely inflow disease; stent and graft appear patent. Left LE with all biphasic waveforms, significant disease in native distal EIA with velocity of 355 cm/s. Graft is widely patent. ABI's not performed since they are not accurate with monophasic waveforms in both brachial arteries.    Carotid Duplex: 40-59% stenosis of bilateral ICA's >50% stenosis of bilateral ECA's Focal right subclavian artery stenosis that appears to be nearly occluded<1cm distal to the vertebral artery ostium. Right vertebral artery is WNL. Left subclavian artery is retrograde proximal to the vertebral artery and monophasic distal to it consistent with proximal subclavian artery occlusion.  Left vertebral artery is retrograde. No previous exam at this facility for comparison.     ASSESSMENT:  [name] is a 50 y.o. female who is s/p placement of right stent in proximal graft segment of femoropopliteal bypass graft 11/22/2014. Graft originally placed 07/05/2010. Left femoropopliteal bypass graft placed 06/27/2011. Right external iliac artery, common femoral artery and proximal graft angioplasty 06/15/2014.  Pt states she has constant tingling and numbness in both hands, no ulcers in hands. 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   Today's bilateral LE arterial duplex suggests right LE waveforms are all monophasic, likely inflow disease; stent and graft appear patent. Left LE with all biphasic waveforms, significant disease in native distal EIA with velocity of 355 cm/s. Graft is widely patent. ABI's not performed since they  are not accurate with monophasic waveforms in both brachial arteries.   Carotid duplex today suggest 40-59% stenosis of bilateral ICA's >50% stenosis of bilateral ECA's Focal right subclavian artery stenosis that appears to be nearly occluded<1cm distal to the vertebral artery ostium. Right vertebral artery is WNL. Left subclavian artery is retrograde proximal to the vertebral artery and monophasic distal to it consistent with proximal subclavian artery occlusion.  Left vertebral artery is retrograde. No previous exam at this facility for comparison.    Pt's atherosclerotic risk factors include uncontrolled DM and continued smoking, she is cutting back her smoking.  PLAN:   Based on today's exam and non-invasive vascular lab results, the patient will follow up on 4/18/17with Dr. Trula Slade to discuss how to address bilateral subclavian artery occlusion and 355 velocity of left EIA.  The patient was counseled re smoking cessation and given several free resources re smoking cessation.   I discussed in depth with the patient the nature of atherosclerosis, and emphasized the importance of maximal medical management including strict control of blood pressure, blood glucose, and lipid levels, obtaining regular exercise, and cessation of smoking.  The patient is aware that without maximal medical management the underlying atherosclerotic disease process will progress, limiting the benefit of any interventions.  The patient was given information about stroke prevention and what symptoms should prompt the patient to seek immediate medical care.  The patient was given information about PAD including signs, symptoms, treatment, what symptoms should prompt the patient to seek immediate medical care, and risk reduction measures to  take. Thank you for allowing [place] to participate in this patient's care.  Clemon Chambers, RN, MSN, FNP-C [facility] Office: [phone]  Clinic MD: Kellie Simmering   02/26/2016 2:04 PM

## 2016-02-28 ENCOUNTER — Ambulatory Visit: Payer: Medicare Other | Attending: Pain Medicine | Admitting: Pain Medicine

## 2016-02-28 ENCOUNTER — Encounter: Payer: Self-pay | Admitting: Pain Medicine

## 2016-02-28 VITALS — BP 157/67 | HR 93 | Temp 98.4°F | Resp 16 | Ht 66.0 in | Wt 165.0 lb

## 2016-02-28 DIAGNOSIS — M533 Sacrococcygeal disorders, not elsewhere classified: Secondary | ICD-10-CM

## 2016-02-28 DIAGNOSIS — M5134 Other intervertebral disc degeneration, thoracic region: Secondary | ICD-10-CM | POA: Insufficient documentation

## 2016-02-28 DIAGNOSIS — M5030 Other cervical disc degeneration, unspecified cervical region: Secondary | ICD-10-CM | POA: Diagnosis not present

## 2016-02-28 DIAGNOSIS — M5136 Other intervertebral disc degeneration, lumbar region: Secondary | ICD-10-CM | POA: Diagnosis not present

## 2016-02-28 DIAGNOSIS — I739 Peripheral vascular disease, unspecified: Secondary | ICD-10-CM | POA: Diagnosis not present

## 2016-02-28 DIAGNOSIS — M5481 Occipital neuralgia: Secondary | ICD-10-CM

## 2016-02-28 DIAGNOSIS — I7092 Chronic total occlusion of artery of the extremities: Secondary | ICD-10-CM

## 2016-02-28 DIAGNOSIS — M542 Cervicalgia: Secondary | ICD-10-CM | POA: Diagnosis present

## 2016-02-28 DIAGNOSIS — M51379 Other intervertebral disc degeneration, lumbosacral region without mention of lumbar back pain or lower extremity pain: Secondary | ICD-10-CM

## 2016-02-28 DIAGNOSIS — M5137 Other intervertebral disc degeneration, lumbosacral region: Secondary | ICD-10-CM

## 2016-02-28 DIAGNOSIS — M546 Pain in thoracic spine: Secondary | ICD-10-CM | POA: Diagnosis present

## 2016-02-28 DIAGNOSIS — M47816 Spondylosis without myelopathy or radiculopathy, lumbar region: Secondary | ICD-10-CM

## 2016-02-28 DIAGNOSIS — R51 Headache: Secondary | ICD-10-CM | POA: Diagnosis present

## 2016-02-28 MED ORDER — PREGABALIN 100 MG PO CAPS: ORAL_CAPSULE | ORAL | Status: DC

## 2016-02-28 MED ORDER — HYDROCODONE-ACETAMINOPHEN 10-325 MG PO TABS: ORAL_TABLET | ORAL | Status: DC

## 2016-02-28 MED ORDER — CYCLOBENZAPRINE HCL 10 MG PO TABS: ORAL_TABLET | ORAL | Status: DC

## 2016-02-28 NOTE — Patient Instructions (Addendum)
PLAN   Continue present medication Lyrica, Flexeril, and hydrocodone acetaminophen  F/U PCP Dr. Lennox Grumbles for evaliation of  BP diabetes mellitus and general medical  condition  F/U surgical evaluation. May consider pending follow-up evaluations  F/U vascular evaluation as discussed  F/U neurological evaluation. May consider PNCV/EMG studies and other studies pending follow-up evaluations  May consider radiofrequency rhizolysis or intraspinal procedures pending response to present treatment and F/U evaluation   Patient to call [facility] should patient have concerns prior to scheduled return appointment.GENERAL RISKS AND COMPLICATIONS  What are the risk, side effects and possible complications? Generally speaking, most procedures are safe.  However, with any procedure there are risks, side effects, and the possibility of complications.  The risks and complications are dependent upon the sites that are lesioned, or the type of nerve block to be performed.  The closer the procedure is to the spine, the more serious the risks are.  Great care is taken when placing the radio frequency needles, block needles or lesioning probes, but sometimes complications can occur. 1. Infection: Any time there is an injection through the skin, there is a risk of infection.  This is why sterile conditions are used for these blocks.  There are four possible types of infection. 1. Localized skin infection. 2. Central Nervous System Infection-This can be in the form of Meningitis, which can be deadly. 3. Epidural Infections-This can be in the form of an epidural abscess, which can cause pressure inside of the spine, causing compression of the spinal cord with subsequent paralysis. This would require an emergency surgery to decompress, and there are no guarantees that the patient would recover from the paralysis. 4. Discitis-This is an infection of the intervertebral discs.  It occurs in about 1% of discography  procedures.  It is difficult to treat and it may lead to surgery.        2. Pain: the needles have to go through skin and soft tissues, will cause soreness.       3. Damage to internal structures:  The nerves to be lesioned may be near blood vessels or    other nerves which can be potentially damaged.       4. Bleeding: Bleeding is more common if the patient is taking blood thinners such as  aspirin, Coumadin, Ticiid, Plavix, etc., or if he/she have some genetic predisposition  such as hemophilia. Bleeding into the spinal canal can cause compression of the spinal  cord with subsequent paralysis.  This would require an emergency surgery to  decompress and there are no guarantees that the patient would recover from the  paralysis.       5. Pneumothorax:  Puncturing of a lung is a possibility, every time a needle is introduced in  the area of the chest or upper back.  Pneumothorax refers to free air around the  collapsed lung(s), inside of the thoracic cavity (chest cavity).  Another two possible  complications related to a similar event would include: Hemothorax and Chylothorax.   These are variations of the Pneumothorax, where instead of air around the collapsed  lung(s), you may have blood or chyle, respectively.       6. Spinal headaches: They may occur with any procedures in the area of the spine.       7. Persistent CSF (Cerebro-Spinal Fluid) leakage: This is a rare problem, but may occur  with prolonged intrathecal or epidural catheters either due to the formation of a fistulous  track  or a dural tear.       8. Nerve damage: By working so close to the spinal cord, there is always a possibility of  nerve damage, which could be as serious as a permanent spinal cord injury with  paralysis.       9. Death:  Although rare, severe deadly allergic reactions known as "Anaphylactic  reaction" can occur to any of the medications used.      10. Worsening of the symptoms:  We can always make thing worse.  What  are the chances of something like this happening? Chances of any of this occuring are extremely low.  By statistics, you have more of a chance of getting killed in a motor vehicle accident: while driving to the hospital than any of the above occurring .  Nevertheless, you should be aware that they are possibilities.  In general, it is similar to taking a shower.  Everybody knows that you can slip, hit your head and get killed.  Does that mean that you should not shower again?  Nevertheless always keep in mind that statistics do not mean anything if you happen to be on the wrong side of them.  Even if a procedure has a 1 (one) in a 1,000,000 (million) chance of going wrong, it you happen to be that one..Also, keep in mind that by statistics, you have more of a chance of having something go wrong when taking medications.  Who should not have this procedure? If you are on a blood thinning medication (e.g. Coumadin, Plavix, see list of "Blood Thinners"), or if you have an active infection going on, you should not have the procedure.  If you are taking any blood thinners, please inform your physician.  How should I prepare for this procedure?  Do not eat or drink anything at least six hours prior to the procedure.  Bring a driver with you .  It cannot be a taxi.  Come accompanied by an adult that can drive you back, and that is strong enough to help you if your legs get weak or numb from the local anesthetic.  Take all of your medicines the morning of the procedure with just enough water to swallow them.  If you have diabetes, make sure that you are scheduled to have your procedure done first thing in the morning, whenever possible.  If you have diabetes, take only half of your insulin dose and notify our nurse that you have done so as soon as you arrive at the clinic.  If you are diabetic, but only take blood sugar pills (oral hypoglycemic), then do not take them on the morning of your procedure.   You may take them after you have had the procedure.  Do not take aspirin or any aspirin-containing medications, at least eleven (11) days prior to the procedure.  They may prolong bleeding.  Wear loose fitting clothing that may be easy to take off and that you would not mind if it got stained with Betadine or blood.  Do not wear any jewelry or perfume  Remove any nail coloring.  It will interfere with some of our monitoring equipment.  NOTE: Remember that this is not meant to be interpreted as a complete list of all possible complications.  Unforeseen problems may occur.  BLOOD THINNERS The following drugs contain aspirin or other products, which can cause increased bleeding during surgery and should not be taken for 2 weeks prior to and 1 week after surgery.  If you should need take something for relief of minor pain, you may take acetaminophen which is found in Tylenol,m Datril, Anacin-3 and Panadol. It is not blood thinner. The products listed below are.  Do not take any of the products listed below in addition to any listed on your instruction sheet.  A.P.C or A.P.C with Codeine Codeine Phosphate Capsules #3 Ibuprofen Ridaura  ABC compound Congesprin Imuran rimadil  Advil Cope Indocin Robaxisal  Alka-Seltzer Effervescent Pain Reliever and Antacid Coricidin or Coricidin-D  Indomethacin Rufen  Alka-Seltzer plus Cold Medicine Cosprin Ketoprofen S-A-C Tablets  Anacin Analgesic Tablets or Capsules Coumadin Korlgesic Salflex  Anacin Extra Strength Analgesic tablets or capsules CP-2 Tablets Lanoril Salicylate  Anaprox Cuprimine Capsules Levenox Salocol  Anexsia-D Dalteparin Magan Salsalate  Anodynos Darvon compound Magnesium Salicylate Sine-off  Ansaid Dasin Capsules Magsal Sodium Salicylate  Anturane Depen Capsules Marnal Soma  APF Arthritis pain formula Dewitt's Pills Measurin Stanback  Argesic Dia-Gesic Meclofenamic Sulfinpyrazone  Arthritis Bayer Timed Release Aspirin Diclofenac  Meclomen Sulindac  Arthritis pain formula Anacin Dicumarol Medipren Supac  Analgesic (Safety coated) Arthralgen Diffunasal Mefanamic Suprofen  Arthritis Strength Bufferin Dihydrocodeine Mepro Compound Suprol  Arthropan liquid Dopirydamole Methcarbomol with Aspirin Synalgos  ASA tablets/Enseals Disalcid Micrainin Tagament  Ascriptin Doan's Midol Talwin  Ascriptin A/D Dolene Mobidin Tanderil  Ascriptin Extra Strength Dolobid Moblgesic Ticlid  Ascriptin with Codeine Doloprin or Doloprin with Codeine Momentum Tolectin  Asperbuf Duoprin Mono-gesic Trendar  Aspergum Duradyne Motrin or Motrin IB Triminicin  Aspirin plain, buffered or enteric coated Durasal Myochrisine Trigesic  Aspirin Suppositories Easprin Nalfon Trillsate  Aspirin with Codeine Ecotrin Regular or Extra Strength Naprosyn Uracel  Atromid-S Efficin Naproxen Ursinus  Auranofin Capsules Elmiron Neocylate Vanquish  Axotal Emagrin Norgesic Verin  Azathioprine Empirin or Empirin with Codeine Normiflo Vitamin E  Azolid Emprazil Nuprin Voltaren  Bayer Aspirin plain, buffered or children's or timed BC Tablets or powders Encaprin Orgaran Warfarin Sodium  Buff-a-Comp Enoxaparin Orudis Zorpin  Buff-a-Comp with Codeine Equegesic Os-Cal-Gesic   Buffaprin Excedrin plain, buffered or Extra Strength Oxalid   Bufferin Arthritis Strength Feldene Oxphenbutazone   Bufferin plain or Extra Strength Feldene Capsules Oxycodone with Aspirin   Bufferin with Codeine Fenoprofen Fenoprofen Pabalate or Pabalate-SF   Buffets II Flogesic Panagesic   Buffinol plain or Extra Strength Florinal or Florinal with Codeine Panwarfarin   Buf-Tabs Flurbiprofen Penicillamine   Butalbital Compound Four-way cold tablets Penicillin   Butazolidin Fragmin Pepto-Bismol   Carbenicillin Geminisyn Percodan   Carna Arthritis Reliever Geopen Persantine   Carprofen Gold's salt Persistin   Chloramphenicol Goody's Phenylbutazone   Chloromycetin Haltrain Piroxlcam   Clmetidine  heparin Plaquenil   Cllnoril Hyco-pap Ponstel   Clofibrate Hydroxy chloroquine Propoxyphen         Before stopping any of these medications, be sure to consult the physician who ordered them.  Some, such as Coumadin (Warfarin) are ordered to prevent or treat serious conditions such as "deep thrombosis", "pumonary embolisms", and other heart problems.  The amount of time that you may need off of the medication may also vary with the medication and the reason for which you were taking it.  If you are taking any of these medications, please make sure you notify your pain physician before you undergo any procedures.         Sacroiliac (SI) Joint Injection Patient Information  Description: The sacroiliac joint connects the scrum (very low back and tailbone) to the ilium (a pelvic bone which also forms half of the hip joint).  Normally this joint experiences very little motion.  When this joint becomes inflamed or unstable low back and or hip and pelvis pain may result.  Injection of this joint with local anesthetics (numbing medicines) and steroids can provide diagnostic information and reduce pain.  This injection is performed with the aid of x-ray guidance into the tailbone area while you are lying on your stomach.   You may experience an electrical sensation down the leg while this is being done.  You may also experience numbness.  We also may ask if we are reproducing your normal pain during the injection.  Conditions which may be treated SI injection:   Low back, buttock, hip or leg pain  Preparation for the Injection:  1. Do not eat any solid food or dairy products within 8 hours of your appointment.  2. You may drink clear liquids up to 3 hours before appointment.  Clear liquids include water, black coffee, juice or soda.  No milk or cream please. 3. You may take your regular medications, including pain medications with a sip of water before your appointment.  Diabetics should hold  regular insulin (if take separately) and take 1/2 normal NPH dose the morning of the procedure.  Carry some sugar containing items with you to your appointment. 4. A driver must accompany you and be prepared to drive you home after your procedure. 5. Bring all of your current medications with you. 6. An IV may be inserted and sedation may be given at the discretion of the physician. 7. A blood pressure cuff, EKG and other monitors will often be applied during the procedure.  Some patients may need to have extra oxygen administered for a short period.  8. You will be asked to provide medical information, including your allergies, prior to the procedure.  We must know immediately if you are taking blood thinners (like Coumadin/Warfarin) or if you are allergic to IV iodine contrast (dye).  We must know if you could possible be pregnant.  Possible side effects:   Bleeding from needle site  Infection (rare, may require surgery)  Nerve injury (rare)  Numbness & tingling (temporary)  A brief convulsion or seizure  Light-headedness (temporary)  Pain at injection site (several days)  Decreased blood pressure (temporary)  Weakness in the leg (temporary)   Call if you experience:   New onset weakness or numbness of an extremity below the injection site that last more than 8 hours.  Hives or difficulty breathing ( go to the emergency room)  Inflammation or drainage at the injection site  Any new symptoms which are concerning to you  Please note:  Although the local anesthetic injected can often make your back/ hip/ buttock/ leg feel good for several hours after the injections, the pain will likely return.  It takes 3-7 days for steroids to work in the sacroiliac area.  You may not notice any pain relief for at least that one week.  If effective, we will often do a series of three injections spaced 3-6 weeks apart to maximally decrease your pain.  After the initial series, we generally  will wait some months before a repeat injection of the same type.  If you have any questions, please call [phone] [facility]

## 2016-02-28 NOTE — Progress Notes (Signed)
Patient here for medication management Safety precautions to be maintained throughout the outpatient stay will include: orient to surroundings, keep bed in low position, maintain call bell within reach at all times, provide assistance with transfer out of bed and ambulation.  

## 2016-02-28 NOTE — Progress Notes (Signed)
   Subjective:    Patient ID: [name], female    DOB: 1966/01/09, 50 y.o.   MRN: [id number]  HPI  The patient is a 50 year old female who returns to pain management for further evaluation and treatment of pain involving headaches as well as pain involving the neck entire back upper and lower extremity region. The patient states that she has exacerbation of her lower back and lower extremity pain. We discussed patient's overall condition and discussed patient undergoing vascular evaluation. Patient is with history of peripheral vascular disease. The patient states that she has increased lower extremity pain. We will proceed with interventional treatment of patient's lower back and lower extremity pain. At the present time we will proceed with treatment of the intraspinal abnormalities and will consider patient for lumbar sympathetic block to address vascular abnormalities as well. We will continue patient's medications consisting of Lyrica Flexeril and hydrocodone acetaminophen and will proceed with lumbosacral selective nerve root block at time return appointment as discussed and as explained to patient on today's visit who was with understanding and in agreement with suggested treatment plan  Review of Systems     Objective:   Physical Exam  Was tenderness over the splenius capitis and occipitalis musculature regions palpation which be produced pain of mild-to-moderate degree with mild to moderate tenderness over the cervical facet cervical paraspinal musculature region. Palpation of the acromioclavicular and glenohumeral joint regions were attends to palpation of mild to moderate degree. The patient appeared to be with unremarkable Spurling's maneuver and was slightly decreased grip strength. Tinel and Phalen's maneuver were without increased pain of significant degree. Palpation over the thoracic region thoracic paraspinal must reason was with muscle spasm involving the lower thoracic region  of moderately severe degree. Lateral bending rotation extension and palpation over the lumbar facets reproduced moderately severe discomfort on the left as well as on the right. Palpation over the PSIS and PII S region as well as the gluteal and piriformis muscles region reproduced pain of moderate to moderately severe discomfort. Straight leg raising was tolerated to approximately 20 without a definite increased pain with dorsiflexion noted. EHL strength appeared to be decreased. There was no clonus negative Homans. The knees were tenderness to palpation with no increased warmth and erythema in the region of the knees. There was crepitus of the knees with negative anterior and posterior drawer signs without ballottement of the patella. Abdomen was nontender with no costovertebral tenderness noted      Assessment & Plan:     Sacroiliac joint dysfunction  Greater occipital neuralgia  Degenerative disc disease of the cervical spine  Cervical facet syndrome  Degenerative disc disease of the thoracic spine  Thoracic facet syndrome  Degenerative disc disease lumbar spine      PLAN   Continue present medication Lyrica, Flexeril, and hydrocodone acetaminophen  Lumbosacral selective nerve root block to be performed at time return appointment  F/U PCP Dr. Lennox Grumbles for evaliation of  BP diabetes mellitus and general medical  condition  F/U surgical evaluation. May consider pending follow-up evaluations  F/U vascular evaluation as discussed  F/U neurological evaluation. May consider PNCV/EMG studies and other studies pending follow-up evaluations  May consider radiofrequency rhizolysis or intraspinal procedures pending response to present treatment and F/U evaluation   Patient to call [facility] Center should patient have concerns prior to scheduled return appointment.

## 2016-02-29 ENCOUNTER — Encounter: Payer: Self-pay | Admitting: Surgery

## 2016-03-05 ENCOUNTER — Encounter: Payer: Self-pay | Admitting: Surgery

## 2016-03-05 ENCOUNTER — Ambulatory Visit (INDEPENDENT_AMBULATORY_CARE_PROVIDER_SITE_OTHER): Payer: Medicare Other | Admitting: Surgery

## 2016-03-05 VITALS — BP 101/64 | HR 100 | Temp 97.6°F | Resp 16 | Ht 67.5 in | Wt 163.5 lb

## 2016-03-05 DIAGNOSIS — I748 Embolism and thrombosis of other arteries: Secondary | ICD-10-CM | POA: Diagnosis not present

## 2016-03-05 DIAGNOSIS — R202 Paresthesia of skin: Secondary | ICD-10-CM | POA: Diagnosis not present

## 2016-03-05 DIAGNOSIS — I708 Atherosclerosis of other arteries: Secondary | ICD-10-CM

## 2016-03-05 NOTE — Progress Notes (Signed)
Patient name: [name] MRN: [id number] DOB: 01/18/1966 Sex: female     Chief Complaint  Patient presents with  . evaluation numbness/tingling bilat hands    Bilat Subclavian Artery Occlusion per carotid artery duplex on 4/10    HISTORY OF PRESENT ILLNESS: The patient comes back today for followup. She is status post right femoral to below knee popliteal artery bypass graft with vein on 07/05/2010 for nonhealing ulcer, and left femoral to below knee popliteal artery bypass graft with vein on 06/27/2011 . She had a high-grade stenosis identified at the proximal anastomosis on the right and on 06/15/2014 she underwent drug coated balloon angioplasty with a 6 mm balloon.Follow-up ultrasound revealed a recurrence of the stenosis and she was taken back for angiography on 11/22/2014. She had a recurrent high-grade stenosis within the proximal bypass graft. A dissection was created after balloon angioplasty that did not respond to repeat angioplasty therefore a 6 x 30 self-expanding stent was deployed.  Her most recent intervention was on 11/22/2014.  She recently underwent ultrasound.  She was found to have elevated velocities within the left external iliac artery.  The patient was also complaining of numbness in both hands.  Ultrasound identified bilateral subclavian disease.  The patient states that she has been having trouble with her arms for a long time.  She has attributed this to neuropathy.  She states that with minimal activity they give out.  Past Medical History  Diagnosis Date  . Diabetes mellitus   . PAD (peripheral artery disease) ([place])   . Chronic back pain   . Asthma   . Hyperlipidemia   . Leg pain     With Walking  . Depression   . Arthritis   . Ulcer     Foot  . Reflux   . Joint pain   . Peripheral arterial disease [facility])     Past Surgical History  Procedure Laterality Date  . Arterial bypass surgry  07/05/2010    Right Common Femoral to below knee  popliteal BPG  . Skin graft Right 2012    [name] by Dr. Nils Pyle- Right and Left Ankle  . Back surgery      X's  2  . Tonsillectomy    . Spine surgery    . Cholecystectomy      Gall Bladder  . Abdominal aortagram  June 15, 2014  . Intercostal nerve block  November 2015  . Abdominal aortagram N/A 06/15/2014    Procedure: ABDOMINAL Maxcine Ham;  Surgeon: Serafina Mitchell, MD;  Location: [facility] LAB;  Service: Cardiovascular;  Laterality: N/A;  . Abdominal aortagram N/A 11/22/2014    Procedure: ABDOMINAL AORTAGRAM;  Surgeon: Serafina Mitchell, MD;  Location: [facility] CATH LAB;  Service: Cardiovascular;  Laterality: N/A;  . Cystectomy Right     foot  . Cystectomy Left     wrist  . Left foot surgery    . Left wrist cyst removal Left     Social History   Social History  . Marital Status: Single    Spouse Name: N/A  . Number of Children: N/A  . Years of Education: N/A   Occupational History  . Not on file.   Social History Main Topics  . Smoking status: Current Every Day Smoker -- 0.20 packs/day for 30 years    Types: Cigarettes  . Smokeless tobacco: Never Used     Comment: 2 cigarrettes a day  . Alcohol Use: No  . Drug Use: No  .  Sexual Activity: Yes    Birth Control/ Protection: Post-menopausal   Other Topics Concern  . Not on file   Social History Narrative    Family History  Problem Relation Age of Onset  . Coronary artery disease Mother   . Peripheral vascular disease Mother   . Heart disease Mother     Before age 63  . Other Mother     Venous insuffiency  . Diabetes Mother   . Hyperlipidemia Mother   . Hypertension Mother   . Varicose Veins Mother   . Heart attack Mother     before age 54  . Heart disease Father   . Diabetes Father   . Diabetes Sister   . Hypertension Sister   . Diabetes Brother   . Hypertension Brother   . Diabetes Maternal Grandmother   . Diabetes Paternal Grandmother   . Diabetes Paternal Grandfather     Allergies as of 03/05/2016 - Review  Complete 03/05/2016  Allergen Reaction Noted  . Penicillins Other (See Comments) 06/24/2011    Current Outpatient Prescriptions on File Prior to Visit  Medication Sig Dispense Refill  . ACCU-CHEK AVIVA PLUS test strip     . ACCU-CHEK SOFTCLIX LANCETS lancets     . albuterol (PROVENTIL HFA;VENTOLIN HFA) 108 (90 Base) MCG/ACT inhaler Inhale 2 puffs into the lungs as needed for wheezing or shortness of breath. Reported on 02/28/2016    . albuterol (PROVENTIL) 2 MG tablet Take 2 mg by mouth 3 (three) times daily.     Marland Kitchen aspirin 81 MG tablet Take 81 mg by mouth daily.    . Blood Glucose Monitoring Suppl (ACCU-CHEK AVIVA PLUS) w/Device KIT     . cetirizine (ZYRTEC) 10 MG tablet Take 10 mg by mouth daily.    . Cholecalciferol (VITAMIN D3) 50000 units CAPS Take 50,000 Units by mouth once a week. Reported on 02/28/2016    . Choline Fenofibrate (FENOFIBRIC ACID) 45 MG CPDR     . ciprofloxacin (CIPRO) 250 MG tablet Take 1 tablet (250 mg total) by mouth 2 (two) times daily. 14 tablet 0  . ciprofloxacin (CIPRO) 250 MG tablet Take 1 tablet (250 mg total) by mouth 2 (two) times daily. 14 tablet 0  . cyclobenzaprine (FLEXERIL) 10 MG tablet Take 10 mg by mouth 3 (three) times daily as needed for muscle spasms. Reported on 11/22/2015    . cyclobenzaprine (FLEXERIL) 10 MG tablet Limit 1 tablet by mouth every 8-12 hours if tolerated 90 tablet 0  . doxycycline (VIBRA-TABS) 100 MG tablet Reported on 02/28/2016    . furosemide (LASIX) 40 MG tablet Take 40 mg by mouth 2 (two) times daily as needed for fluid.     Marland Kitchen HUMALOG 100 UNIT/ML injection Reported on 02/28/2016    . HUMALOG KWIKPEN 100 UNIT/ML KiwkPen     . HYDROcodone-acetaminophen (NORCO) 10-325 MG tablet Limit 1 tablet by mouth per day or twice per day if tolerated 60 tablet 0  . insulin aspart (NOVOLOG) 100 UNIT/ML injection Inject 10 Units into the skin 3 (three) times daily before meals. Reported on 02/05/2016    . LANTUS SOLOSTAR 100 UNIT/ML Solostar Pen  Inject 60 Units into the skin at bedtime.     Marland Kitchen loperamide (IMODIUM A-D) 2 MG tablet Take 2 mg by mouth as needed for diarrhea or loose stools.    . meclizine (ANTIVERT) 25 MG tablet Take 25 mg by mouth 3 (three) times daily as needed for dizziness.    . naproxen sodium (ANAPROX)  220 MG tablet Take 220 mg by mouth 2 (two) times daily as needed (pain). Reported on 02/28/2016    . omeprazole (PRILOSEC) 20 MG capsule Take 20 mg by mouth daily.    . pregabalin (LYRICA) 100 MG capsule Take 100 mg by mouth 3 (three) times daily.    . pregabalin (LYRICA) 100 MG capsule Limit 1 tab by mouth twice a day to 3 times a day if tolerated 90 capsule 0  . pseudoephedrine-guaifenesin (MUCINEX D) 60-600 MG per tablet Take 1 tablet by mouth every 12 (twelve) hours as needed for congestion. Reported on 02/28/2016    . Vitamin D, Ergocalciferol, (DRISDOL) 50000 UNITS CAPS capsule Take 50,000 Units by mouth every 7 (seven) days. Reported on 02/28/2016    . simethicone (MYLICON) 353 MG chewable tablet Chew 125 mg by mouth every 6 (six) hours as needed for flatulence.     Current Facility-Administered Medications on File Prior to Visit  Medication Dose Route Frequency Provider Last Rate Last Dose  . 0.9 %  sodium chloride infusion   Intravenous Continuous Serafina Mitchell, MD      . bupivacaine (PF) (MARCAINE) 0.25 % injection 30 mL  30 mL Other Once Mohammed Kindle, MD      . ciprofloxacin (CIPRO) IVPB 400 mg  400 mg Intravenous Once Mohammed Kindle, MD      . ciprofloxacin (CIPRO) IVPB 400 mg  400 mg Intravenous Once Mohammed Kindle, MD      . ciprofloxacin (CIPRO) tablet 250 mg  250 mg Oral BID Mohammed Kindle, MD      . fentaNYL (SUBLIMAZE) injection 100 mcg  100 mcg Intravenous Once Mohammed Kindle, MD      . fentaNYL (SUBLIMAZE) injection 100 mcg  100 mcg Intravenous Once Mohammed Kindle, MD      . lactated ringers infusion 1,000 mL  1,000 mL Intravenous Continuous Mohammed Kindle, MD      . lactated ringers infusion 1,000 mL   1,000 mL Intravenous Continuous Mohammed Kindle, MD      . lactated ringers infusion 1,000 mL  1,000 mL Intravenous Continuous Mohammed Kindle, MD      . lidocaine (PF) (XYLOCAINE) 1 % injection 10 mL  10 mL Subcutaneous Once Mohammed Kindle, MD      . lidocaine (PF) (XYLOCAINE) 1 % injection 10 mL  10 mL Subcutaneous Once Mohammed Kindle, MD      . midazolam (VERSED) 5 MG/5ML injection 5 mg  5 mg Intravenous Once Mohammed Kindle, MD      . midazolam (VERSED) 5 MG/5ML injection 5 mg  5 mg Intravenous Once Mohammed Kindle, MD      . orphenadrine (NORFLEX) injection 60 mg  60 mg Intramuscular Once Mohammed Kindle, MD      . orphenadrine (NORFLEX) injection 60 mg  60 mg Intramuscular Once Mohammed Kindle, MD      . orphenadrine (NORFLEX) injection 60 mg  60 mg Intramuscular Once Mohammed Kindle, MD      . sodium chloride 0.9 % injection 20 mL  20 mL Other Once Mohammed Kindle, MD      . sodium chloride 0.9 % injection 20 mL  20 mL Other Once Mohammed Kindle, MD      . triamcinolone acetonide (KENALOG-40) injection 40 mg  40 mg Other Once Mohammed Kindle, MD      . triamcinolone acetonide (KENALOG-40) injection 40 mg  40 mg Other Once Mohammed Kindle, MD         REVIEW OF SYSTEMS: Cardiovascular:  No chest pain,Arm weakness with activity.  Leg weakness with activity. Pulmonary: No productive cough, asthma or wheezing. Neurologic: No weakness, paresthesias, loss of vision in right eye secondary to diabetes Hematologic: No bleeding problems or clotting disorders. Musculoskeletal: No joint pain or joint swelling. Gastrointestinal: No blood in stool or hematemesis Genitourinary: No dysuria or hematuria. Psychiatric:: No history of major depression. Integumentary: No rashes or ulcers. Constitutional: No fever or chills.  PHYSICAL EXAMINATION:   Vital signs are  Filed Vitals:   03/05/16 1227  BP: 101/64  Pulse: 100  Temp: 97.6 F (36.4 C)  TempSrc: Oral  Resp: 16  Height: 5' 7.5" (1.715 m)  Weight: 163 lb 8 oz  (74.163 kg)   Body mass index is 25.22 kg/(m^2). General: The patient appears their stated age. HEENT:  No gross abnormalities Pulmonary:  Non labored breathing Musculoskeletal: There are no major deformities. Neurologic: No focal weakness or paresthesias are detected, Skin: There are no ulcer or rashes noted. Psychiatric: The patient has normal affect. Cardiovascular: There is a regular rate and rhythm without significant murmur appreciated.Radial pulses are not palpable bilateral   Diagnostic Studies I have reviewed her duplex ultrasound.  This shows elevated velocities within the left external iliac artery.  Both femoral-popliteal bypass grafts are widely patent.  Bilateral native artery inflow stenosis  Carotid duplex shows 40-59% bilateral carotid stenosis.  There is a focal right subclavian stenosis just beyond the vertebral artery origin.  The left subclavian artery appears occluded  Assessment: Atherosclerosis with claudication, bilateral upper and lower extremity Plan: I will plan on proceeding with angiography to evaluate bilateral inflow stenosis.  The most prominent appears to be the left.  I will intervene on the iliac artery on the left or right depending on the degree of stenosis.  In addition I'll proceed with aortic arch angiography in order to get more formation about her subclavian artery disease.  There is a possibility that she would be a candidate for percutaneous intervention on the right.  I would proceed at this time if she is a candidate.  She would like to delay this until after she gets back from vacation so she is been scheduled for late June  V. Leia Alf, M.D. [facility] Office: [phone] Pager:  [phone]

## 2016-03-18 ENCOUNTER — Ambulatory Visit: Payer: Medicare Other | Attending: Pain Medicine | Admitting: Pain Medicine

## 2016-03-18 ENCOUNTER — Encounter: Payer: Self-pay | Admitting: Pain Medicine

## 2016-03-18 ENCOUNTER — Telehealth: Payer: Self-pay

## 2016-03-18 VITALS — BP 92/76 | HR 86 | Temp 97.8°F | Resp 12 | Ht 67.0 in | Wt 163.0 lb

## 2016-03-18 DIAGNOSIS — M47816 Spondylosis without myelopathy or radiculopathy, lumbar region: Secondary | ICD-10-CM

## 2016-03-18 DIAGNOSIS — M5481 Occipital neuralgia: Secondary | ICD-10-CM

## 2016-03-18 DIAGNOSIS — M791 Myalgia: Secondary | ICD-10-CM | POA: Diagnosis present

## 2016-03-18 DIAGNOSIS — I7092 Chronic total occlusion of artery of the extremities: Secondary | ICD-10-CM

## 2016-03-18 DIAGNOSIS — M5136 Other intervertebral disc degeneration, lumbar region: Secondary | ICD-10-CM | POA: Insufficient documentation

## 2016-03-18 DIAGNOSIS — M5137 Other intervertebral disc degeneration, lumbosacral region: Secondary | ICD-10-CM

## 2016-03-18 DIAGNOSIS — M4806 Spinal stenosis, lumbar region: Secondary | ICD-10-CM | POA: Insufficient documentation

## 2016-03-18 DIAGNOSIS — M533 Sacrococcygeal disorders, not elsewhere classified: Secondary | ICD-10-CM

## 2016-03-18 DIAGNOSIS — M25559 Pain in unspecified hip: Secondary | ICD-10-CM

## 2016-03-18 DIAGNOSIS — M5030 Other cervical disc degeneration, unspecified cervical region: Secondary | ICD-10-CM

## 2016-03-18 DIAGNOSIS — M545 Low back pain: Secondary | ICD-10-CM | POA: Diagnosis present

## 2016-03-18 DIAGNOSIS — Z981 Arthrodesis status: Secondary | ICD-10-CM | POA: Insufficient documentation

## 2016-03-18 DIAGNOSIS — I739 Peripheral vascular disease, unspecified: Secondary | ICD-10-CM

## 2016-03-18 DIAGNOSIS — M79606 Pain in leg, unspecified: Secondary | ICD-10-CM | POA: Diagnosis present

## 2016-03-18 MED ORDER — LACTATED RINGERS IV SOLN: 1000.0000 mL | INTRAVENOUS | Status: DC

## 2016-03-18 MED ORDER — FENTANYL CITRATE (PF) 100 MCG/2ML IJ SOLN
INTRAMUSCULAR | Status: AC
  Administered 2016-03-18: 100 ug
  Filled 2016-03-18: qty 2

## 2016-03-18 MED ORDER — TRIAMCINOLONE ACETONIDE 40 MG/ML IJ SUSP
INTRAMUSCULAR | Status: AC
  Administered 2016-03-18: 10:00:00
  Filled 2016-03-18: qty 1

## 2016-03-18 MED ORDER — CIPROFLOXACIN IN D5W 400 MG/200ML IV SOLN: 400.0000 mg | Freq: Once | INTRAVENOUS | Status: DC

## 2016-03-18 MED ORDER — CIPROFLOXACIN IN D5W 400 MG/200ML IV SOLN
INTRAVENOUS | Status: AC
  Administered 2016-03-18: 10:00:00
  Filled 2016-03-18: qty 200

## 2016-03-18 MED ORDER — MIDAZOLAM HCL 5 MG/5ML IJ SOLN
INTRAMUSCULAR | Status: AC
  Administered 2016-03-18: 3 mg
  Filled 2016-03-18: qty 5

## 2016-03-18 MED ORDER — ORPHENADRINE CITRATE 30 MG/ML IJ SOLN
INTRAMUSCULAR | Status: AC
  Administered 2016-03-18: 10:00:00
  Filled 2016-03-18: qty 2

## 2016-03-18 MED ORDER — BUPIVACAINE HCL (PF) 0.25 % IJ SOLN
INTRAMUSCULAR | Status: AC
  Administered 2016-03-18: 10:00:00
  Filled 2016-03-18: qty 30

## 2016-03-18 MED ORDER — CIPROFLOXACIN HCL 250 MG PO TABS: 250.0000 mg | ORAL_TABLET | Freq: Two times a day (BID) | ORAL | Status: DC

## 2016-03-18 MED ORDER — BUPIVACAINE HCL (PF) 0.25 % IJ SOLN: 30.0000 mL | Freq: Once | INTRAMUSCULAR | Status: DC

## 2016-03-18 MED ORDER — MIDAZOLAM HCL 5 MG/5ML IJ SOLN: 5.0000 mg | Freq: Once | INTRAMUSCULAR | Status: DC

## 2016-03-18 MED ORDER — ORPHENADRINE CITRATE 30 MG/ML IJ SOLN: 60.0000 mg | Freq: Once | INTRAMUSCULAR | Status: DC

## 2016-03-18 MED ORDER — FENTANYL CITRATE (PF) 100 MCG/2ML IJ SOLN: 100.0000 ug | Freq: Once | INTRAMUSCULAR | Status: DC

## 2016-03-18 MED ORDER — TRIAMCINOLONE ACETONIDE 40 MG/ML IJ SUSP: 40.0000 mg | Freq: Once | INTRAMUSCULAR | Status: DC

## 2016-03-18 NOTE — Patient Instructions (Addendum)
PLAN   Continue present medication hydrocodone acetaminophen and begin taking antibiotic Cipro today as prescribed  F/U PCP Dr. Lennox Grumbles for evaliation of  BP and general medical condition Please follow-up with Dr. Lennox Grumbles this week as we discussed for evaluation of blood pressure and general medical condition  F/U surgical evaluation  F/U neurological evaluation. Neurological evaluation of headaches as discussed May consider PNCV/EMG studies and other studies to evaluate pain of spine and extremities   May consider radiofrequency rhizolysis or intraspinal procedures pending response to present treatment and F/U evaluation   Patient to call Pain Management Center should patient have concerns prior to scheduled return appointment  Pain Management Discharge Instructions  General Discharge Instructions :  If you need to reach your doctor call: Monday-Friday 8:00 am - 4:00 pm at [phone] or toll free [phone].  After clinic hours [phone] to have operator reach doctor.  Bring all of your medication bottles to all your appointments in the pain clinic.  To cancel or reschedule your appointment with Pain Management please remember to call 24 hours in advance to avoid a fee.  Refer to the educational materials which you have been given on: General Risks, I had my Procedure. Discharge Instructions, Post Sedation.  Post Procedure Instructions:  The drugs you were given will stay in your system until tomorrow, so for the next 24 hours you should not drive, make any legal decisions or drink any alcoholic beverages.  You may eat anything you prefer, but it is better to start with liquids then soups and crackers, and gradually work up to solid foods.  Please notify your doctor immediately if you have any unusual bleeding, trouble breathing or pain that is not related to your normal pain.  Depending on the type of procedure that was done, some parts of your body may feel week and/or numb.   This usually clears up by tonight or the next day.  Walk with the use of an assistive device or accompanied by an adult for the 24 hours.  You may use ice on the affected area for the first 24 hours.  Put ice in a Ziploc bag and cover with a towel and place against area 15 minutes on 15 minutes off.  You may switch to heat after 24 hours.  A prescription for CIPRO was sent to your pharmacy and should be available for pickup today.

## 2016-03-18 NOTE — Telephone Encounter (Signed)
Dr Primus Bravo would like patient to follow up with Dr Lennox Grumbles about her Blood Pressure.  Attempted to call patient.  Left message to call office.

## 2016-03-18 NOTE — Progress Notes (Signed)
Patient here for procedure d/t lower back pain. Safety precautions to be maintained throughout the outpatient stay will include: orient to surroundings, keep bed in low position, maintain call bell within reach at all times, provide assistance with transfer out of bed and ambulation.  

## 2016-03-18 NOTE — Progress Notes (Signed)
Subjective:    Patient ID: [name], female    DOB: 05/16/1966, 50 y.o.   MRN: [id number]  HPI  PROCEDURE:  Block of nerves to the sacroiliac joint.   NOTE:  The patient is a 49 y.o. female who returns to the Pain Management Center for further evaluation and treatment of pain involving the lower back and lower extremity region with pain in the region of the buttocks as well. Prior MRI studies reveal degenerative disc disease lumbar spine1. Uncomplicated decompression and fusion at L4-L5 and L5-S1.  2. L3-L4 adjacent segment disease with mild central stenosis. . The patient is with reproduction of severe pain with palpation over the PSIS and PII S region and has positive Patrick's maneuver .   There is concern regarding a significant component of the patient's pain being due to sacroiliac joint dysfunction The risks, benefits, expectations of the procedure have been discussed and explained to the patient who is understanding and willing to proceed with interventional treatment in attempt to decrease severity of patient's symptoms, minimize the risk of medication escalation and  hopefully retard the progression of the patient's symptoms. We will proceed with what is felt to be a medically necessary procedure, block of nerves to the sacroiliac joint.   DESCRIPTION OF PROCEDURE:  Block of nerves to the sacroiliac joint.   The patient was taken to the fluoroscopy suite. With the patient in the prone position with EKG, blood pressure, pulse, capnography, and pulse oximetry monitoring, IV Versed, IV fentanyl conscious sedation, Betadine prep of proposed entry site was performed.   Block of nerves at the L5 vertebral body level.   With the patient in prone position, under fluoroscopic guidance, a 22 -gauge needle was inserted at the L5 vertebral body level on the left side. With 15 degrees oblique orientation a 22 -gauge needle was inserted in the region known as Burton's eye or eye of the  Scotty dog. Following documentation of needle placement in the area of Burton's eye or eye of the Scotty dog under fluoroscopic guidance, needle placement was then accomplished at the sacral ala level on the left side.   Needle placement at the sacral ala.   With the patient in prone position under fluoroscopic guidance with AP view of the lumbosacral spine, a 22 -gauge needle was inserted in the region known as the sacral ala on the left side. Following documentation of needle placement on the left side under fluoroscopic guidance needle placement was then accomplished at the S1 foramen level.   Needle placement at the S1 foramen level.   With the patient in prone position under fluoroscopic guidance with AP view of the lumbosacral spine and cephalad orientation, a 22 -gauge needle was inserted at the superior and lateral border of the S1 foramen on the left side. Following documentation of needle placement at the S1 foramen level on the left side, needle placement was then accomplished at the S2 foramen level on the left side.   Needle placement at the S2 foramen level.   With the patient in prone position with AP view of the lumbosacral spine with cephalad orientation, a 22 - gauge needle was inserted at the superior and lateral border of the S2 foramen under fluoroscopic guidance on the left side. Following needle placement at the L5 vertebral body level, sacral ala, S1 foramen and S2 foramen on the left side, needle placement was verified on lateral view under fluoroscopic guidance.  Following needle placement documentation on lateral view,  each needle was injected with 1 mL of 0.25% bupivacaine and Kenalog.   BLOCK OF THE NERVES TO SACROILIAC JOINT ON THE RIGHT SIDE The procedure was performed on the right side at the same levels as was performed on the left side and utilizing the same technique as on the left side and was performed under fluoroscopic guidance as on the left side   A total  of 10mg  of Kenalog was utilized for the procedure.   PLAN:  1. Medications: The patient will continue presently prescribed medication hydrocodone acetaminophen 2. The patient will be considered for modification of treatment regimen pending response to the procedure performed on today's visit.  3. The patient is to follow-up with primary care physician Dr. Lennox Grumbles for evaluation of blood pressure and general medical condition following the procedure performed on today's visit. . The patient is to follow-up with Dr. Lennox Grumbles by Tuesday, 03/19/2016 for evaluation of blood pressure and general medical condition as discussed and as explained to patient on today's visit 4. Surgical evaluation as discussed.  5. Neurological evaluation as discussed. May consider PNCV EMG studies and other studies 6. The patient may be a candidate for radiofrequency procedures, implantation devices and other treatment pending response to treatment performed on today's visit and follow-up evaluation.  7. The patient has been advised to adhere to proper body mechanics and to avoid activities which may exacerbate the patient's symptoms.   Return appointment to Pain Management Center as scheduled.    Review of Systems     Objective:   Physical Exam        Assessment & Plan:

## 2016-03-19 NOTE — Telephone Encounter (Signed)
Post procedure phone call.  Left message.  Instructed patient to call for any questions or concerns about procedure and to see Dr Lennox Grumbles for BP.

## 2016-03-27 ENCOUNTER — Ambulatory Visit: Payer: Medicare Other | Attending: Pain Medicine | Admitting: Pain Medicine

## 2016-03-27 ENCOUNTER — Encounter: Payer: Self-pay | Admitting: Pain Medicine

## 2016-03-27 VITALS — BP 135/91 | HR 80 | Temp 98.0°F | Resp 16 | Ht 67.0 in | Wt 162.0 lb

## 2016-03-27 DIAGNOSIS — M62830 Muscle spasm of back: Secondary | ICD-10-CM | POA: Diagnosis not present

## 2016-03-27 DIAGNOSIS — M5134 Other intervertebral disc degeneration, thoracic region: Secondary | ICD-10-CM | POA: Insufficient documentation

## 2016-03-27 DIAGNOSIS — R51 Headache: Secondary | ICD-10-CM | POA: Diagnosis present

## 2016-03-27 DIAGNOSIS — M5481 Occipital neuralgia: Secondary | ICD-10-CM | POA: Diagnosis not present

## 2016-03-27 DIAGNOSIS — M4806 Spinal stenosis, lumbar region: Secondary | ICD-10-CM | POA: Diagnosis not present

## 2016-03-27 DIAGNOSIS — M5030 Other cervical disc degeneration, unspecified cervical region: Secondary | ICD-10-CM | POA: Insufficient documentation

## 2016-03-27 DIAGNOSIS — I739 Peripheral vascular disease, unspecified: Secondary | ICD-10-CM

## 2016-03-27 DIAGNOSIS — M533 Sacrococcygeal disorders, not elsewhere classified: Secondary | ICD-10-CM | POA: Diagnosis not present

## 2016-03-27 DIAGNOSIS — M546 Pain in thoracic spine: Secondary | ICD-10-CM | POA: Diagnosis present

## 2016-03-27 DIAGNOSIS — M542 Cervicalgia: Secondary | ICD-10-CM | POA: Diagnosis present

## 2016-03-27 DIAGNOSIS — M47816 Spondylosis without myelopathy or radiculopathy, lumbar region: Secondary | ICD-10-CM

## 2016-03-27 DIAGNOSIS — M5136 Other intervertebral disc degeneration, lumbar region: Secondary | ICD-10-CM | POA: Diagnosis not present

## 2016-03-27 DIAGNOSIS — M25559 Pain in unspecified hip: Secondary | ICD-10-CM

## 2016-03-27 DIAGNOSIS — I7092 Chronic total occlusion of artery of the extremities: Secondary | ICD-10-CM

## 2016-03-27 DIAGNOSIS — M5137 Other intervertebral disc degeneration, lumbosacral region: Secondary | ICD-10-CM

## 2016-03-27 MED ORDER — PREGABALIN 100 MG PO CAPS: ORAL_CAPSULE | ORAL | Status: DC

## 2016-03-27 MED ORDER — HYDROCODONE-ACETAMINOPHEN 10-325 MG PO TABS: ORAL_TABLET | ORAL | Status: DC

## 2016-03-27 MED ORDER — CYCLOBENZAPRINE HCL 10 MG PO TABS
ORAL_TABLET | ORAL | Status: DC
Start: 2016-03-27 — End: 2016-06-18

## 2016-03-27 NOTE — Progress Notes (Signed)
Safety precautions to be maintained throughout the outpatient stay will include: orient to surroundings, keep bed in low position, maintain call bell within reach at all times, provide assistance with transfer out of bed and ambulation.  

## 2016-03-27 NOTE — Progress Notes (Signed)
   Subjective:    Patient ID: [name], female    DOB: 13-Dec-1965, 50 y.o.   MRN: [id number]  HPI  The patient is a 50 year old female who returns to pain management for further evaluation and treatment of pain involving the neck associated with headaches as well as pain in the mid back region lower back and lower extremity region. On today's visit we discussed patient's condition and patient stated that the lower back lower extremity pain was most severe. We informed patient that we preferred to avoid interventional treatment at this time and will await patient undergo follow-up evaluation with Dr.Parachos . The patient stated that she was in severe pain and was in knees of a procedure. We informed patient that we would need to obtain medical clearance from Dr. Lennox Grumbles for from [place] prior to proceeding with lumbar facet, medial branch nerve blocks. Pending medical clearance we will proceed with procedure and will continue presently prescribed medications. The patient was in agreement with suggested treatment plan the patient denied any trauma change in events of daily living because symptoms that she presently was experiencing which were severely debilitated       Review of Systems     Objective:   Physical Exam  There was tenderness of the splenius capitis and occipitalis musculature region with tenderness over the cervical facet cervical paraspinal musculature region a moderate degree. Palpation over the acromioclavicular and glenohumeral joint regions reproduces moderate discomfort. The patient appeared to be with unremarkable Spurling's maneuver. Tinel and Phalen's maneuver was associated with mild discomfort and patient was with slightly decreased grip strength. Palpation of the thoracic region thoracic facet region was attends to palpation of moderate degree with moderate muscle spasm involving the upper mid and lower thoracic regions with no crepitus of the thoracic region noted.  There was severe increase of pain with palpation over the lumbar facets with lateral bending rotation extension and palpation of the lumbar facets reproducing severe disabling pain. There was moderate tenderness of the PSIS and PII S region as well as the gluteal and piriformis muscles regions. Palpation of the greater trochanteric region iliotibial band region reproduced mild discomfort. EHL strength appeared to be decreased. No definite sensory deficit of dermatomal dystrophy detected. There was severe increased pain with lateral bending rotation as previously mentioned palpation over the lumbar facets reproduce severely and debilitating discomfort. Abdomen was nontender with no costovertebral tenderness noted      Assessment & Plan:   Degenerative disc disease lumbar spine  Lumbar facet syndrome  Lumbar stenosis  Sacroiliac joint dysfunction  Greater occipital neuralgia  Degenerative disc disease of the cervical spine  Cervical facet syndrome  Degenerative disc disease of the thoracic spine  Thoracic facet syndrome      PLAN   Continue present medication Lyrica, Flexeril, and hydrocodone acetaminophen  Lumbar facet, medial branch nerve, blocks to be performed at time of return appointment pending medical clearance by Dr. Lennox Grumbles  F/U PCP Dr. Lennox Grumbles for evaliation of  BP diabetes mellitus and general medical  condition  F/U surgical evaluation. May consider pending follow-up evaluations  F/U vascular evaluation as discussed  Appointment with Dr.Parachos for cardiac follow-up evaluation  F/U neurological evaluation. May consider PNCV/EMG studies and other studies pending follow-up evaluations  May consider radiofrequency rhizolysis or intraspinal procedures pending response to present treatment and F/U evaluation   Patient to call Pain Management Center should patient have concerns prior to scheduled return appointment.

## 2016-03-27 NOTE — Patient Instructions (Addendum)
PLAN   Continue present medication Lyrica, Flexeril, and hydrocodone acetaminophen  Lumbar facet, medial branch nerve, blocks to be performed at time of return appointment pending medical clearance by Dr. Lennox Grumbles  F/U PCP Dr. Lennox Grumbles for evaliation of  BP diabetes mellitus and general medical  condition  F/U surgical evaluation. May consider pending follow-up evaluations  F/U vascular evaluation as discussed  Appointment with Dr.Parachos for cardiac follow-up evaluation  F/U neurological evaluation. May consider PNCV/EMG studies and other studies pending follow-up evaluations  May consider radiofrequency rhizolysis or intraspinal procedures pending response to present treatment and F/U evaluation   Patient to call [facility] should patient have concerns prior to scheduled return appointment.Facet Joint Block The facet joints connect the bones of the spine (vertebrae). They make it possible for you to bend, twist, and make other movements with your spine. They also prevent you from overbending, overtwisting, and making other excessive movements.  A facet joint block is a procedure where a numbing medicine (anesthetic) is injected into a facet joint. Often, a type of anti-inflammatory medicine called a steroid is also injected. A facet joint block may be done for two reasons:   Diagnosis. A facet joint block may be done as a test to see whether neck or back pain is caused by a worn-down or infected facet joint. If the pain gets better after a facet joint block, it means the pain is probably coming from the facet joint. If the pain does not get better, it means the pain is probably not coming from the facet joint.   Therapy. A facet joint block may be done to relieve neck or back pain caused by a facet joint. A facet joint block is only done as a therapy if the pain does not improve with medicine, exercise programs, physical therapy, and other forms of pain management. LET [facility]  CARE PROVIDER KNOW ABOUT:   Any allergies you have.   All medicines you are taking, including vitamins, herbs, eyedrops, and over-the-counter medicines and creams.   Previous problems you or members of your family have had with the use of anesthetics.   Any blood disorders you have had.   Other health problems you have. RISKS AND COMPLICATIONS Generally, having a facet joint block is safe. However, as with any procedure, complications can occur. Possible complications associated with having a facet joint block include:   Bleeding.   Injury to a nerve near the injection site.   Pain at the injection site.   Weakness or numbness in areas controlled by nerves near the injection site.   Infection.   Temporary fluid retention.   Allergic reaction to anesthetics or medicines used during the procedure. BEFORE THE PROCEDURE   Follow your health care provider's instructions if you are taking dietary supplements or medicines. You may need to stop taking them or reduce your dosage.   Do not take any new dietary supplements or medicines without asking your health care provider first.   Follow your health care provider's instructions about eating and drinking before the procedure. You may need to stop eating and drinking several hours before the procedure.   Arrange to have an adult drive you home after the procedure. PROCEDURE  You may need to remove your clothing and dress in an open-back gown so that your health care provider can access your spine.   The procedure will be done while you are lying on an X-ray table. Most of the time you will be asked  to lie on your stomach, but you may be asked to lie in a different position if an injection will be made in your neck.   Special machines will be used to monitor your oxygen levels, heart rate, and blood pressure.   If an injection will be made in your neck, an intravenous (IV) tube will be inserted into one of your  veins. Fluids and medicine will flow directly into your body through the IV tube.   The area over the facet joint where the injection will be made will be cleaned with an antiseptic soap. The surrounding skin will be covered with sterile drapes.   An anesthetic will be applied to your skin to make the injection area numb. You may feel a temporary stinging or burning sensation.   A video X-ray machine will be used to locate the joint. A contrast dye may be injected into the facet joint area to help with locating the joint.   When the joint is located, an anesthetic medicine will be injected into the joint through the needle.   Your health care provider will ask you whether you feel pain relief. If you do feel relief, a steroid may be injected to provide pain relief for a longer period of time. If you do not feel relief or feel only partial relief, additional injections of an anesthetic may be made in other facet joints.   The needle will be removed, the skin will be cleansed, and bandages will be applied.  AFTER THE PROCEDURE   You will be observed for 15-30 minutes before being allowed to go home. Do not drive. Have an adult drive you or take a taxi or public transportation instead.   If you feel pain relief, the pain will return in several hours or days when the anesthetic wears off.   You may feel pain relief 2-14 days after the procedure. The amount of time this relief lasts varies from person to person.   It is normal to feel some tenderness over the injected area(s) for 2 days following the procedure.   If you have diabetes, you may have a temporary increase in blood sugar.   This information is not intended to replace advice given to you by your health care provider. Make sure you discuss any questions you have with your health care provider.   Document Released: 03/26/2007 Document Revised: 11/25/2014 Document Reviewed: 08/24/2012 Elsevier Interactive Patient Education  2016 [place]  What are the risk, side effects and possible complications? Generally speaking, most procedures are safe.  However, with any procedure there are risks, side effects, and the possibility of complications.  The risks and complications are dependent upon the sites that are lesioned, or the type of nerve block to be performed.  The closer the procedure is to the spine, the more serious the risks are.  Great care is taken when placing the radio frequency needles, block needles or lesioning probes, but sometimes complications can occur.  Infection: Any time there is an injection through the skin, there is a risk of infection.  This is why sterile conditions are used for these blocks.  There are four possible types of infection.  Localized skin infection.  Central Nervous System Infection-This can be in the form of Meningitis, which can be deadly.  Epidural Infections-This can be in the form of an epidural abscess, which can cause pressure inside of the spine, causing compression of the spinal cord with subsequent paralysis. This  would require an emergency surgery to decompress, and there are no guarantees that the patient would recover from the paralysis.  Discitis-This is an infection of the intervertebral discs.  It occurs in about 1% of discography procedures.  It is difficult to treat and it may lead to surgery.        2. Pain: the needles have to go through skin and soft tissues, will cause soreness.       3. Damage to internal structures:  The nerves to be lesioned may be near blood vessels or    other nerves which can be potentially damaged.       4. Bleeding: Bleeding is more common if the patient is taking blood thinners such as  aspirin, Coumadin, Ticiid, Plavix, etc., or if he/she have some genetic predisposition  such as hemophilia. Bleeding into the spinal canal can cause compression of the spinal  cord with subsequent paralysis.  This  would require an emergency surgery to  decompress and there are no guarantees that the patient would recover from the  paralysis.       5. Pneumothorax:  Puncturing of a lung is a possibility, every time a needle is introduced in  the area of the chest or upper back.  Pneumothorax refers to free air around the  collapsed lung(s), inside of the thoracic cavity (chest cavity).  Another two possible  complications related to a similar event would include: Hemothorax and Chylothorax.   These are variations of the Pneumothorax, where instead of air around the collapsed  lung(s), you may have blood or chyle, respectively.       6. Spinal headaches: They may occur with any procedures in the area of the spine.       7. Persistent CSF (Cerebro-Spinal Fluid) leakage: This is a rare problem, but may occur  with prolonged intrathecal or epidural catheters either due to the formation of a fistulous  track or a dural tear.       8. Nerve damage: By working so close to the spinal cord, there is always a possibility of  nerve damage, which could be as serious as a permanent spinal cord injury with  paralysis.       9. Death:  Although rare, severe deadly allergic reactions known as "Anaphylactic  reaction" can occur to any of the medications used.      10. Worsening of the symptoms:  We can always make thing worse.  What are the chances of something like this happening? Chances of any of this occuring are extremely low.  By statistics, you have more of a chance of getting killed in a motor vehicle accident: while driving to the hospital than any of the above occurring .  Nevertheless, you should be aware that they are possibilities.  In general, it is similar to taking a shower.  Everybody knows that you can slip, hit your head and get killed.  Does that mean that you should not shower again?  Nevertheless always keep in mind that statistics do not mean anything if you happen to be on the wrong side of them.  Even if a  procedure has a 1 (one) in a 1,000,000 (million) chance of going wrong, it you happen to be that one..Also, keep in mind that by statistics, you have more of a chance of having something go wrong when taking medications.  Who should not have this procedure? If you are on a blood thinning medication (e.g. Coumadin, Plavix, see list  of "Blood Thinners"), or if you have an active infection going on, you should not have the procedure.  If you are taking any blood thinners, please inform your physician.  How should I prepare for this procedure?  Do not eat or drink anything at least six hours prior to the procedure.  Bring a driver with you .  It cannot be a taxi.  Come accompanied by an adult that can drive you back, and that is strong enough to help you if your legs get weak or numb from the local anesthetic.  Take all of your medicines the morning of the procedure with just enough water to swallow them.  If you have diabetes, make sure that you are scheduled to have your procedure done first thing in the morning, whenever possible.  If you have diabetes, take only half of your insulin dose and notify our nurse that you have done so as soon as you arrive at the clinic.  If you are diabetic, but only take blood sugar pills (oral hypoglycemic), then do not take them on the morning of your procedure.  You may take them after you have had the procedure.  Do not take aspirin or any aspirin-containing medications, at least eleven (11) days prior to the procedure.  They may prolong bleeding.  Wear loose fitting clothing that may be easy to take off and that you would not mind if it got stained with Betadine or blood.  Do not wear any jewelry or perfume  Remove any nail coloring.  It will interfere with some of our monitoring equipment.  NOTE: Remember that this is not meant to be interpreted as a complete list of all possible complications.  Unforeseen problems may occur.  BLOOD THINNERS The  following drugs contain aspirin or other products, which can cause increased bleeding during surgery and should not be taken for 2 weeks prior to and 1 week after surgery.  If you should need take something for relief of minor pain, you may take acetaminophen which is found in Tylenol,m Datril, Anacin-3 and Panadol. It is not blood thinner. The products listed below are.  Do not take any of the products listed below in addition to any listed on your instruction sheet.  A.P.C or A.P.C with Codeine Codeine Phosphate Capsules #3 Ibuprofen Ridaura  ABC compound Congesprin Imuran rimadil  Advil Cope Indocin Robaxisal  Alka-Seltzer Effervescent Pain Reliever and Antacid Coricidin or Coricidin-D  Indomethacin Rufen  Alka-Seltzer plus Cold Medicine Cosprin Ketoprofen S-A-C Tablets  Anacin Analgesic Tablets or Capsules Coumadin Korlgesic Salflex  Anacin Extra Strength Analgesic tablets or capsules CP-2 Tablets Lanoril Salicylate  Anaprox Cuprimine Capsules Levenox Salocol  Anexsia-D Dalteparin Magan Salsalate  Anodynos Darvon compound Magnesium Salicylate Sine-off  Ansaid Dasin Capsules Magsal Sodium Salicylate  Anturane Depen Capsules Marnal Soma  APF Arthritis pain formula Dewitt's Pills Measurin Stanback  Argesic Dia-Gesic Meclofenamic Sulfinpyrazone  Arthritis Bayer Timed Release Aspirin Diclofenac Meclomen Sulindac  Arthritis pain formula Anacin Dicumarol Medipren Supac  Analgesic (Safety coated) Arthralgen Diffunasal Mefanamic Suprofen  Arthritis Strength Bufferin Dihydrocodeine Mepro Compound Suprol  Arthropan liquid Dopirydamole Methcarbomol with Aspirin Synalgos  ASA tablets/Enseals Disalcid Micrainin Tagament  Ascriptin Doan's Midol Talwin  Ascriptin A/D Dolene Mobidin Tanderil  Ascriptin Extra Strength Dolobid Moblgesic Ticlid  Ascriptin with Codeine Doloprin or Doloprin with Codeine Momentum Tolectin  Asperbuf Duoprin Mono-gesic Trendar  Aspergum Duradyne Motrin or Motrin IB Triminicin   Aspirin plain, buffered or enteric coated Durasal Myochrisine Trigesic  Aspirin Suppositories Easprin  Nalfon Trillsate  Aspirin with Codeine Ecotrin Regular or Extra Strength Naprosyn Uracel  Atromid-S Efficin Naproxen Ursinus  Auranofin Capsules Elmiron Neocylate Vanquish  Axotal Emagrin Norgesic Verin  Azathioprine Empirin or Empirin with Codeine Normiflo Vitamin E  Azolid Emprazil Nuprin Voltaren  Bayer Aspirin plain, buffered or children's or timed BC Tablets or powders Encaprin Orgaran Warfarin Sodium  Buff-a-Comp Enoxaparin Orudis Zorpin  Buff-a-Comp with Codeine Equegesic Os-Cal-Gesic   Buffaprin Excedrin plain, buffered or Extra Strength Oxalid   Bufferin Arthritis Strength Feldene Oxphenbutazone   Bufferin plain or Extra Strength Feldene Capsules Oxycodone with Aspirin   Bufferin with Codeine Fenoprofen Fenoprofen Pabalate or Pabalate-SF   Buffets II Flogesic Panagesic   Buffinol plain or Extra Strength Florinal or Florinal with Codeine Panwarfarin   Buf-Tabs Flurbiprofen Penicillamine   Butalbital Compound Four-way cold tablets Penicillin   Butazolidin Fragmin Pepto-Bismol   Carbenicillin Geminisyn Percodan   Carna Arthritis Reliever Geopen Persantine   Carprofen Gold's salt Persistin   Chloramphenicol Goody's Phenylbutazone   Chloromycetin Haltrain Piroxlcam   Clmetidine heparin Plaquenil   Cllnoril Hyco-pap Ponstel   Clofibrate Hydroxy chloroquine Propoxyphen         Before stopping any of these medications, be sure to consult the physician who ordered them.  Some, such as Coumadin (Warfarin) are ordered to prevent or treat serious conditions such as "deep thrombosis", "pumonary embolisms", and other heart problems.  The amount of time that you may need off of the medication may also vary with the medication and the reason for which you were taking it.  If you are taking any of these medications, please make sure you notify your pain physician before you undergo any  procedures.

## 2016-04-03 ENCOUNTER — Other Ambulatory Visit: Payer: Self-pay

## 2016-04-08 ENCOUNTER — Encounter: Payer: Self-pay | Admitting: Pain Medicine

## 2016-04-08 ENCOUNTER — Ambulatory Visit: Payer: Medicare Other | Attending: Pain Medicine | Admitting: Pain Medicine

## 2016-04-08 VITALS — BP 131/76 | HR 95 | Temp 96.7°F | Resp 14 | Ht 67.0 in | Wt 162.0 lb

## 2016-04-08 DIAGNOSIS — M545 Low back pain: Secondary | ICD-10-CM | POA: Insufficient documentation

## 2016-04-08 DIAGNOSIS — M5481 Occipital neuralgia: Secondary | ICD-10-CM

## 2016-04-08 DIAGNOSIS — M5030 Other cervical disc degeneration, unspecified cervical region: Secondary | ICD-10-CM

## 2016-04-08 DIAGNOSIS — I739 Peripheral vascular disease, unspecified: Secondary | ICD-10-CM

## 2016-04-08 DIAGNOSIS — I7092 Chronic total occlusion of artery of the extremities: Secondary | ICD-10-CM

## 2016-04-08 DIAGNOSIS — M533 Sacrococcygeal disorders, not elsewhere classified: Secondary | ICD-10-CM

## 2016-04-08 DIAGNOSIS — M5136 Other intervertebral disc degeneration, lumbar region: Secondary | ICD-10-CM | POA: Diagnosis not present

## 2016-04-08 DIAGNOSIS — M47816 Spondylosis without myelopathy or radiculopathy, lumbar region: Secondary | ICD-10-CM

## 2016-04-08 DIAGNOSIS — M5137 Other intervertebral disc degeneration, lumbosacral region: Secondary | ICD-10-CM

## 2016-04-08 MED ORDER — LACTATED RINGERS IV SOLN
1000.0000 mL | INTRAVENOUS | Status: DC
  Administered 2016-04-08: 1000 mL via INTRAVENOUS

## 2016-04-08 MED ORDER — MIDAZOLAM HCL 5 MG/5ML IJ SOLN
5.0000 mg | Freq: Once | INTRAMUSCULAR | Status: DC
  Filled 2016-04-08: qty 5

## 2016-04-08 MED ORDER — TRIAMCINOLONE ACETONIDE 40 MG/ML IJ SUSP
40.0000 mg | Freq: Once | INTRAMUSCULAR | Status: AC
  Administered 2016-04-08: 40 mg
  Filled 2016-04-08: qty 1

## 2016-04-08 MED ORDER — ORPHENADRINE CITRATE 30 MG/ML IJ SOLN
60.0000 mg | Freq: Once | INTRAMUSCULAR | Status: AC
  Administered 2016-04-08: 60 mg via INTRAMUSCULAR
  Filled 2016-04-08: qty 2

## 2016-04-08 MED ORDER — BUPIVACAINE HCL (PF) 0.25 % IJ SOLN
30.0000 mL | Freq: Once | INTRAMUSCULAR | Status: AC
Start: 2016-04-08 — End: 2016-04-08
  Administered 2016-04-08: 30 mL
  Filled 2016-04-08: qty 30

## 2016-04-08 MED ORDER — CIPROFLOXACIN HCL 250 MG PO TABS: 250.0000 mg | ORAL_TABLET | Freq: Two times a day (BID) | ORAL | Status: DC

## 2016-04-08 MED ORDER — FENTANYL CITRATE (PF) 100 MCG/2ML IJ SOLN
100.0000 ug | Freq: Once | INTRAMUSCULAR | Status: DC
  Filled 2016-04-08: qty 2

## 2016-04-08 MED ORDER — CIPROFLOXACIN IN D5W 400 MG/200ML IV SOLN
400.0000 mg | Freq: Once | INTRAVENOUS | Status: AC
  Administered 2016-04-08: 400 mg via INTRAVENOUS
  Filled 2016-04-08: qty 200

## 2016-04-08 NOTE — Progress Notes (Signed)
Subjective:    Patient ID: [name], female    DOB: 12/11/1965, 50 y.o.   MRN: [id number]  HPI  PROCEDURE PERFORMED: Lumbar facet (medial branch block)   NOTE: The patient is a 50 y.o. female who returns to [place] for further evaluation and treatment of pain involving the lumbar and lower extremity region. 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 There is concern regarding significant component of patient's pain being due to lumbar facet syndrome The risks, benefits, and expectations of the procedure have been discussed and explained to the patient who was understanding and in agreement with suggested treatment plan. We will proceed with interventional treatment as discussed and as explained to the patient who was understanding and wished to proceed with procedure as planned.   DESCRIPTION OF PROCEDURE: Lumbar facet (medial branch block) with IV Versed, IV fentanyl conscious sedation, EKG, blood pressure, pulse, capnography, and pulse oximetry monitoring. The procedure was performed with the patient in the prone position. Betadine prep of proposed entry site performed.   NEEDLE PLACEMENT AT: Left L 2 lumbar facet (medial branch block). Under fluoroscopic guidance with oblique orientation of 15 degrees, a 22-gauge needle was inserted at the L 2 vertebral body level with needle placed at the targeted area of Burton's Eye or Eye of the Scotty Dog with documentation of needle placement in the superior and lateral border of targeted area of Burton's Eye or Eye of the Scotty Dog with oblique orientation of 15 degrees. Following documentation of needle placement at the L 2 vertebral body level, needle placement was then accomplished at the L 3 vertebral body level.   NEEDLE PLACEMENT AT L3, L4, and L5 VERTEBRAL BODY LEVELS ON THE LEFT SIDE The procedure was performed at the L3, L4, and L5 vertebral body levels exactly  as was performed at the L 2 vertebral body level utilizing the same technique and under fluoroscopic guidance.  NEEDLE PLACEMENT AT THE SACRAL ALA with AP view of the lumbosacral spine. With the patient in the prone position, Betadine prep of proposed entry site accomplished, a 22 gauge needle was inserted in the region of the sacral ala (groove formed by the superior articulating process of S1 and the sacral wing). Following documentation of needle placement at the sacral ala,    Needle placement was then verified at all levels on lateral view. Following documentation of needle placement at all levels on lateral view and following negative aspiration for heme and CSF, each level was injected with 1 mL of 0.25% bupivacaine with Kenalog.     LUMBAR FACET, MEDIAL BRANCH NERVE, BLOCKS PERFORMED ON THE RIGHT SIDE   The procedure was performed on the right side exactly as was performed on the left side at the same levels and utilizing the same technique under fluoroscopic guidance.     The patient tolerated the procedure well. A total of 40 mg of Kenalog was utilized for the procedure.   PLAN:  1. Medications: The patient will continue presently prescribed medication hydrocodone acetaminophen 2. May consider modification of treatment regimen at time of return appointment pending response to treatment rendered on today's visit. 3. The patient is to follow-up with primary care physician Dr. Lennox Grumbles for further evaluation of blood pressure and general medical condition status post steroid injection performed on today's visit. 4. Surgical follow-up evaluation. Has been addressed 5. Neurological follow-up evaluation.. May consider PNCV EMG studies and  other studies 6. The patient may be candidate for radiofrequency procedures, implantation type procedures, and other treatment pending response to treatment and follow-up evaluation. 7. The patient has been advised to call the [facility] Management Center  prior to scheduled return appointment should there be significant change in condition or should patient have other concerns regarding condition prior to scheduled return appointment.  The patient is understanding and in agreement with suggested treatment plan.   Review of Systems     Objective:   Physical Exam        Assessment & Plan:

## 2016-04-08 NOTE — Patient Instructions (Addendum)
PLAN   Continue present medication hydrocodone acetaminophen and begin taking antibiotic Cipro today as prescribed  F/U PCP Dr. Lennox Grumbles for evaliation of  BP and general medical  condition  F/U surgical evaluation  F/U neurological evaluation. Neurological evaluation of headaches as discussed   May consider radiofrequency rhizolysis or intraspinal procedures pending response to present treatment and F/U evaluation   Patient to call [facility] should patient have concerns prior to scheduled return appointmentPain Management Discharge Instructions  General Discharge Instructions :  If you need to reach your doctor call: Monday-Friday 8:00 am - 4:00 pm at [phone] or toll free [phone].  After clinic hours [phone] to have operator reach doctor.  Bring all of your medication bottles to all your appointments in the pain clinic.  To cancel or reschedule your appointment with Pain Management please remember to call 24 hours in advance to avoid a fee.  Refer to the educational materials which you have been given on: General Risks, I had my Procedure. Discharge Instructions, Post Sedation.  Post Procedure Instructions:  The drugs you were given will stay in your system until tomorrow, so for the next 24 hours you should not drive, make any legal decisions or drink any alcoholic beverages.  You may eat anything you prefer, but it is better to start with liquids then soups and crackers, and gradually work up to solid foods.  Please notify your doctor immediately if you have any unusual bleeding, trouble breathing or pain that is not related to your normal pain.  Depending on the type of procedure that was done, some parts of your body may feel week and/or numb.  This usually clears up by tonight or the next day.  Walk with the use of an assistive device or accompanied by an adult for the 24 hours.  You may use ice on the affected area for the first 24 hours.  Put ice  in a Ziploc bag and cover with a towel and place against area 15 minutes on 15 minutes off.  You may switch to heat after 24 hours.GENERAL RISKS AND COMPLICATIONS  What are the risk, side effects and possible complications? Generally speaking, most procedures are safe.  However, with any procedure there are risks, side effects, and the possibility of complications.  The risks and complications are dependent upon the sites that are lesioned, or the type of nerve block to be performed.  The closer the procedure is to the spine, the more serious the risks are.  Great care is taken when placing the radio frequency needles, block needles or lesioning probes, but sometimes complications can occur. 1. Infection: Any time there is an injection through the skin, there is a risk of infection.  This is why sterile conditions are used for these blocks.  There are four possible types of infection. 1. Localized skin infection. 2. Central Nervous System Infection-This can be in the form of Meningitis, which can be deadly. 3. Epidural Infections-This can be in the form of an epidural abscess, which can cause pressure inside of the spine, causing compression of the spinal cord with subsequent paralysis. This would require an emergency surgery to decompress, and there are no guarantees that the patient would recover from the paralysis. 4. Discitis-This is an infection of the intervertebral discs.  It occurs in about 1% of discography procedures.  It is difficult to treat and it may lead to surgery.        2. Pain: the needles have to go  through skin and soft tissues, will cause soreness.       3. Damage to internal structures:  The nerves to be lesioned may be near blood vessels or    other nerves which can be potentially damaged.       4. Bleeding: Bleeding is more common if the patient is taking blood thinners such as  aspirin, Coumadin, Ticiid, Plavix, etc., or if he/she have some genetic predisposition  such as  hemophilia. Bleeding into the spinal canal can cause compression of the spinal  cord with subsequent paralysis.  This would require an emergency surgery to  decompress and there are no guarantees that the patient would recover from the  paralysis.       5. Pneumothorax:  Puncturing of a lung is a possibility, every time a needle is introduced in  the area of the chest or upper back.  Pneumothorax refers to free air around the  collapsed lung(s), inside of the thoracic cavity (chest cavity).  Another two possible  complications related to a similar event would include: Hemothorax and Chylothorax.   These are variations of the Pneumothorax, where instead of air around the collapsed  lung(s), you may have blood or chyle, respectively.       6. Spinal headaches: They may occur with any procedures in the area of the spine.       7. Persistent CSF (Cerebro-Spinal Fluid) leakage: This is a rare problem, but may occur  with prolonged intrathecal or epidural catheters either due to the formation of a fistulous  track or a dural tear.       8. Nerve damage: By working so close to the spinal cord, there is always a possibility of  nerve damage, which could be as serious as a permanent spinal cord injury with  paralysis.       9. Death:  Although rare, severe deadly allergic reactions known as "Anaphylactic  reaction" can occur to any of the medications used.      10. Worsening of the symptoms:  We can always make thing worse.  What are the chances of something like this happening? Chances of any of this occuring are extremely low.  By statistics, you have more of a chance of getting killed in a motor vehicle accident: while driving to the hospital than any of the above occurring .  Nevertheless, you should be aware that they are possibilities.  In general, it is similar to taking a shower.  Everybody knows that you can slip, hit your head and get killed.  Does that mean that you should not shower again?  Nevertheless  always keep in mind that statistics do not mean anything if you happen to be on the wrong side of them.  Even if a procedure has a 1 (one) in a 1,000,000 (million) chance of going wrong, it you happen to be that one..Also, keep in mind that by statistics, you have more of a chance of having something go wrong when taking medications.  Who should not have this procedure? If you are on a blood thinning medication (e.g. Coumadin, Plavix, see list of "Blood Thinners"), or if you have an active infection going on, you should not have the procedure.  If you are taking any blood thinners, please inform your physician.  How should I prepare for this procedure?  Do not eat or drink anything at least six hours prior to the procedure.  Bring a driver with you .  It cannot be  a taxi.  Come accompanied by an adult that can drive you back, and that is strong enough to help you if your legs get weak or numb from the local anesthetic.  Take all of your medicines the morning of the procedure with just enough water to swallow them.  If you have diabetes, make sure that you are scheduled to have your procedure done first thing in the morning, whenever possible.  If you have diabetes, take only half of your insulin dose and notify our nurse that you have done so as soon as you arrive at the clinic.  If you are diabetic, but only take blood sugar pills (oral hypoglycemic), then do not take them on the morning of your procedure.  You may take them after you have had the procedure.  Do not take aspirin or any aspirin-containing medications, at least eleven (11) days prior to the procedure.  They may prolong bleeding.  Wear loose fitting clothing that may be easy to take off and that you would not mind if it got stained with Betadine or blood.  Do not wear any jewelry or perfume  Remove any nail coloring.  It will interfere with some of our monitoring equipment.  NOTE: Remember that this is not meant to be  interpreted as a complete list of all possible complications.  Unforeseen problems may occur.  BLOOD THINNERS The following drugs contain aspirin or other products, which can cause increased bleeding during surgery and should not be taken for 2 weeks prior to and 1 week after surgery.  If you should need take something for relief of minor pain, you may take acetaminophen which is found in Tylenol,m Datril, Anacin-3 and Panadol. It is not blood thinner. The products listed below are.  Do not take any of the products listed below in addition to any listed on your instruction sheet.  A.P.C or A.P.C with Codeine Codeine Phosphate Capsules #3 Ibuprofen Ridaura  ABC compound Congesprin Imuran rimadil  Advil Cope Indocin Robaxisal  Alka-Seltzer Effervescent Pain Reliever and Antacid Coricidin or Coricidin-D  Indomethacin Rufen  Alka-Seltzer plus Cold Medicine Cosprin Ketoprofen S-A-C Tablets  Anacin Analgesic Tablets or Capsules Coumadin Korlgesic Salflex  Anacin Extra Strength Analgesic tablets or capsules CP-2 Tablets Lanoril Salicylate  Anaprox Cuprimine Capsules Levenox Salocol  Anexsia-D Dalteparin Magan Salsalate  Anodynos Darvon compound Magnesium Salicylate Sine-off  Ansaid Dasin Capsules Magsal Sodium Salicylate  Anturane Depen Capsules Marnal Soma  APF Arthritis pain formula Dewitt's Pills Measurin Stanback  Argesic Dia-Gesic Meclofenamic Sulfinpyrazone  Arthritis Bayer Timed Release Aspirin Diclofenac Meclomen Sulindac  Arthritis pain formula Anacin Dicumarol Medipren Supac  Analgesic (Safety coated) Arthralgen Diffunasal Mefanamic Suprofen  Arthritis Strength Bufferin Dihydrocodeine Mepro Compound Suprol  Arthropan liquid Dopirydamole Methcarbomol with Aspirin Synalgos  ASA tablets/Enseals Disalcid Micrainin Tagament  Ascriptin Doan's Midol Talwin  Ascriptin A/D Dolene Mobidin Tanderil  Ascriptin Extra Strength Dolobid Moblgesic Ticlid  Ascriptin with Codeine Doloprin or Doloprin  with Codeine Momentum Tolectin  Asperbuf Duoprin Mono-gesic Trendar  Aspergum Duradyne Motrin or Motrin IB Triminicin  Aspirin plain, buffered or enteric coated Durasal Myochrisine Trigesic  Aspirin Suppositories Easprin Nalfon Trillsate  Aspirin with Codeine Ecotrin Regular or Extra Strength Naprosyn Uracel  Atromid-S Efficin Naproxen Ursinus  Auranofin Capsules Elmiron Neocylate Vanquish  Axotal Emagrin Norgesic Verin  Azathioprine Empirin or Empirin with Codeine Normiflo Vitamin E  Azolid Emprazil Nuprin Voltaren  Bayer Aspirin plain, buffered or children's or timed BC Tablets or powders Encaprin Orgaran Warfarin Sodium  Buff-a-Comp Enoxaparin  Orudis Zorpin  Buff-a-Comp with Codeine Equegesic Os-Cal-Gesic   Buffaprin Excedrin plain, buffered or Extra Strength Oxalid   Bufferin Arthritis Strength Feldene Oxphenbutazone   Bufferin plain or Extra Strength Feldene Capsules Oxycodone with Aspirin   Bufferin with Codeine Fenoprofen Fenoprofen Pabalate or Pabalate-SF   Buffets II Flogesic Panagesic   Buffinol plain or Extra Strength Florinal or Florinal with Codeine Panwarfarin   Buf-Tabs Flurbiprofen Penicillamine   Butalbital Compound Four-way cold tablets Penicillin   Butazolidin Fragmin Pepto-Bismol   Carbenicillin Geminisyn Percodan   Carna Arthritis Reliever Geopen Persantine   Carprofen Gold's salt Persistin   Chloramphenicol Goody's Phenylbutazone   Chloromycetin Haltrain Piroxlcam   Clmetidine heparin Plaquenil   Cllnoril Hyco-pap Ponstel   Clofibrate Hydroxy chloroquine Propoxyphen         Before stopping any of these medications, be sure to consult the physician who ordered them.  Some, such as Coumadin (Warfarin) are ordered to prevent or treat serious conditions such as "deep thrombosis", "pumonary embolisms", and other heart problems.  The amount of time that you may need off of the medication may also vary with the medication and the reason for which you were taking it.   If you are taking any of these medications, please make sure you notify your pain physician before you undergo any procedures.         Facet Joint Block, Care After Refer to this sheet in the next few weeks. These instructions provide you with information on caring for yourself after your procedure. Your health care provider may also give you more specific instructions. Your treatment has been planned according to current medical practices, but problems sometimes occur. Call your health care provider if you have any problems or questions after your procedure. HOME CARE INSTRUCTIONS  2. Keep track of the amount of pain relief you feel and how long it lasts. 3. Limit pain medicine within the first 4-6 hours after the procedure as directed by your health care provider. 4. Resume taking dietary supplements and medicines as directed by your health care provider. 5. You may resume your regular diet. 6. Do not apply heat near or over the injection site(s) for 24 hours.  7. Do not take a bath or soak in water (such as a pool or lake) for 24 hours. 8. Do not drive for 24 hours unless approved by your health care provider. 9. Avoid strenuous activity for 24 hours. 10. Remove your bandages the morning after the procedure.  11. If the injection site is tender, applying an ice pack may relieve some tenderness. To do this: 1. Put ice in a bag. 2. Place a towel between your skin and the bag. 3. Leave the ice on for 15-20 minutes, 3-4 times a day. 12. Keep follow-up appointments as directed by your health care provider. SEEK MEDICAL CARE IF:   Your pain is not controlled by your medicines.   There is drainage from the injection site.   There is significant bleeding or swelling at the injection site.  You have diabetes and your blood sugar is above 180 mg/dL. SEEK IMMEDIATE MEDICAL CARE IF:   You develop a fever of 101F (38.3C) or greater.   You have worsening pain or swelling around  the injection site.   You have red streaking around the injection site.   You develop severe pain that is not controlled by your medicines.   You develop a headache, stiff neck, nausea, or vomiting.   Your eyes become very sensitive  to light.   You have weakness, paralysis, or tingling in your arms or legs that was not present before the procedure.   You develop difficulty urinating or breathing.    This information is not intended to replace advice given to you by your health care provider. Make sure you discuss any questions you have with your health care provider.   Document Released: 10/21/2012 Document Revised: 11/25/2014 Document Reviewed: 10/21/2012 Elsevier Interactive Patient Education 2016 [employer] Facet Joint Block The facet joints connect the bones of the spine (vertebrae). They make it possible for you to bend, twist, and make other movements with your spine. They also prevent you from overbending, overtwisting, and making other excessive movements.  A facet joint block is a procedure where a numbing medicine (anesthetic) is injected into a facet joint. Often, a type of anti-inflammatory medicine called a steroid is also injected. A facet joint block may be done for two reasons:  13. Diagnosis. A facet joint block may be done as a test to see whether neck or back pain is caused by a worn-down or infected facet joint. If the pain gets better after a facet joint block, it means the pain is probably coming from the facet joint. If the pain does not get better, it means the pain is probably not coming from the facet joint.  14. Therapy. A facet joint block may be done to relieve neck or back pain caused by a facet joint. A facet joint block is only done as a therapy if the pain does not improve with medicine, exercise programs, physical therapy, and other forms of pain management. LET [facility] CARE PROVIDER KNOW ABOUT:   Any allergies you have.   All medicines  you are taking, including vitamins, herbs, eyedrops, and over-the-counter medicines and creams.   Previous problems you or members of your family have had with the use of anesthetics.   Any blood disorders you have had.   Other health problems you have. RISKS AND COMPLICATIONS Generally, having a facet joint block is safe. However, as with any procedure, complications can occur. Possible complications associated with having a facet joint block include:   Bleeding.   Injury to a nerve near the injection site.   Pain at the injection site.   Weakness or numbness in areas controlled by nerves near the injection site.   Infection.   Temporary fluid retention.   Allergic reaction to anesthetics or medicines used during the procedure. BEFORE THE PROCEDURE   Follow your health care provider's instructions if you are taking dietary supplements or medicines. You may need to stop taking them or reduce your dosage.   Do not take any new dietary supplements or medicines without asking your health care provider first.   Follow your health care provider's instructions about eating and drinking before the procedure. You may need to stop eating and drinking several hours before the procedure.   Arrange to have an adult drive you home after the procedure. PROCEDURE 12. You may need to remove your clothing and dress in an open-back gown so that your health care provider can access your spine.  13. The procedure will be done while you are lying on an X-ray table. Most of the time you will be asked to lie on your stomach, but you may be asked to lie in a different position if an injection will be made in your neck.  14. Special machines will be used to monitor your oxygen levels, heart  rate, and blood pressure.  15. If an injection will be made in your neck, an intravenous (IV) tube will be inserted into one of your veins. Fluids and medicine will flow directly into your body through  the IV tube.  16. The area over the facet joint where the injection will be made will be cleaned with an antiseptic soap. The surrounding skin will be covered with sterile drapes.  17. An anesthetic will be applied to your skin to make the injection area numb. You may feel a temporary stinging or burning sensation.  18. A video X-ray machine will be used to locate the joint. A contrast dye may be injected into the facet joint area to help with locating the joint.  19. When the joint is located, an anesthetic medicine will be injected into the joint through the needle.  36. Your health care provider will ask you whether you feel pain relief. If you do feel relief, a steroid may be injected to provide pain relief for a longer period of time. If you do not feel relief or feel only partial relief, additional injections of an anesthetic may be made in other facet joints.  21. The needle will be removed, the skin will be cleansed, and bandages will be applied.  AFTER THE PROCEDURE   You will be observed for 15-30 minutes before being allowed to go home. Do not drive. Have an adult drive you or take a taxi or public transportation instead.   If you feel pain relief, the pain will return in several hours or days when the anesthetic wears off.   You may feel pain relief 2-14 days after the procedure. The amount of time this relief lasts varies from person to person.   It is normal to feel some tenderness over the injected area(s) for 2 days following the procedure.   If you have diabetes, you may have a temporary increase in blood sugar.   This information is not intended to replace advice given to you by your health care provider. Make sure you discuss any questions you have with your health care provider.   Document Released: 03/26/2007 Document Revised: 11/25/2014 Document Reviewed: 08/24/2012 Elsevier Interactive Patient Education [employer].

## 2016-04-08 NOTE — Progress Notes (Signed)
Safety precautions to be maintained throughout the outpatient stay will include: orient to surroundings, keep bed in low position, maintain call bell within reach at all times, provide assistance with transfer out of bed and ambulation.  

## 2016-04-09 ENCOUNTER — Telehealth: Payer: Self-pay | Admitting: *Deleted

## 2016-04-09 ENCOUNTER — Other Ambulatory Visit: Payer: Self-pay | Admitting: Family Medicine

## 2016-04-09 DIAGNOSIS — Z139 Encounter for screening, unspecified: Secondary | ICD-10-CM

## 2016-04-09 NOTE — Telephone Encounter (Signed)
Left voicemail re; procedure on yesterday, to call if there are questions or concerns.

## 2016-04-09 NOTE — Telephone Encounter (Signed)
Left message

## 2016-04-10 ENCOUNTER — Ambulatory Visit: Payer: Medicare Other | Admitting: Pain Medicine

## 2016-05-01 ENCOUNTER — Ambulatory Visit: Payer: Medicare Other | Admitting: Pain Medicine

## 2016-05-07 ENCOUNTER — Other Ambulatory Visit: Payer: Self-pay | Admitting: *Deleted

## 2016-05-07 ENCOUNTER — Ambulatory Visit (HOSPITAL_COMMUNITY)
Admission: RE | Admit: 2016-05-07 | Discharge: 2016-05-07 | Disposition: A | Discharge status: Home / Self Care | Payer: Medicare Other | Source: Ambulatory Visit | Attending: Surgery | Admitting: Surgery

## 2016-05-07 ENCOUNTER — Other Ambulatory Visit: Payer: Self-pay

## 2016-05-07 ENCOUNTER — Encounter (HOSPITAL_COMMUNITY)
Admission: RE | Disposition: A | Discharge status: Home / Self Care | Payer: Self-pay | Source: Ambulatory Visit | Attending: Surgery

## 2016-05-07 DIAGNOSIS — E785 Hyperlipidemia, unspecified: Secondary | ICD-10-CM | POA: Insufficient documentation

## 2016-05-07 DIAGNOSIS — Z833 Family history of diabetes mellitus: Secondary | ICD-10-CM | POA: Insufficient documentation

## 2016-05-07 DIAGNOSIS — J45909 Unspecified asthma, uncomplicated: Secondary | ICD-10-CM | POA: Insufficient documentation

## 2016-05-07 DIAGNOSIS — I708 Atherosclerosis of other arteries: Secondary | ICD-10-CM | POA: Insufficient documentation

## 2016-05-07 DIAGNOSIS — K219 Gastro-esophageal reflux disease without esophagitis: Secondary | ICD-10-CM | POA: Diagnosis not present

## 2016-05-07 DIAGNOSIS — E1140 Type 2 diabetes mellitus with diabetic neuropathy, unspecified: Secondary | ICD-10-CM | POA: Insufficient documentation

## 2016-05-07 DIAGNOSIS — Z8249 Family history of ischemic heart disease and other diseases of the circulatory system: Secondary | ICD-10-CM | POA: Insufficient documentation

## 2016-05-07 DIAGNOSIS — I70213 Atherosclerosis of native arteries of extremities with intermittent claudication, bilateral legs: Secondary | ICD-10-CM

## 2016-05-07 DIAGNOSIS — Z794 Long term (current) use of insulin: Secondary | ICD-10-CM | POA: Diagnosis not present

## 2016-05-07 DIAGNOSIS — M1990 Unspecified osteoarthritis, unspecified site: Secondary | ICD-10-CM | POA: Diagnosis not present

## 2016-05-07 DIAGNOSIS — E1151 Type 2 diabetes mellitus with diabetic peripheral angiopathy without gangrene: Secondary | ICD-10-CM | POA: Insufficient documentation

## 2016-05-07 DIAGNOSIS — F329 Major depressive disorder, single episode, unspecified: Secondary | ICD-10-CM | POA: Insufficient documentation

## 2016-05-07 DIAGNOSIS — I739 Peripheral vascular disease, unspecified: Secondary | ICD-10-CM

## 2016-05-07 DIAGNOSIS — G8929 Other chronic pain: Secondary | ICD-10-CM | POA: Diagnosis not present

## 2016-05-07 DIAGNOSIS — M549 Dorsalgia, unspecified: Secondary | ICD-10-CM | POA: Diagnosis not present

## 2016-05-07 DIAGNOSIS — Z7982 Long term (current) use of aspirin: Secondary | ICD-10-CM | POA: Diagnosis not present

## 2016-05-07 DIAGNOSIS — I772 Rupture of artery: Secondary | ICD-10-CM

## 2016-05-07 DIAGNOSIS — F17210 Nicotine dependence, cigarettes, uncomplicated: Secondary | ICD-10-CM | POA: Diagnosis not present

## 2016-05-07 DIAGNOSIS — R0989 Other specified symptoms and signs involving the circulatory and respiratory systems: Secondary | ICD-10-CM | POA: Diagnosis not present

## 2016-05-07 HISTORY — PX: PERIPHERAL VASCULAR CATHETERIZATION: SHX172C

## 2016-05-07 LAB — POCT ACTIVATED CLOTTING TIME
ACTIVATED CLOTTING TIME: 213 s
ACTIVATED CLOTTING TIME: 164 s
Activated Clotting Time: 235 seconds

## 2016-05-07 LAB — POCT I-STAT, CHEM 8
BUN: 16 mg/dL (ref 6–20)
Calcium, Ion: 1.15 mmol/L (ref 1.12–1.23)
Chloride: 95 mmol/L — ABNORMAL LOW (ref 101–111)
Creatinine, Ser: 0.6 mg/dL (ref 0.44–1.00)
Glucose, Bld: 300 mg/dL — ABNORMAL HIGH (ref 65–99)
HEMATOCRIT: 50 % — AB (ref 36.0–46.0)
HEMOGLOBIN: 17 g/dL — AB (ref 12.0–15.0)
Potassium: 3.6 mmol/L (ref 3.5–5.1)
SODIUM: 137 mmol/L (ref 135–145)
TCO2: 32 mmol/L (ref 0–100)

## 2016-05-07 LAB — GLUCOSE, CAPILLARY: GLUCOSE-CAPILLARY: 237 mg/dL — AB (ref 65–99)

## 2016-05-07 SURGERY — ABDOMINAL AORTOGRAM
Anesthesia: LOCAL | Laterality: Right

## 2016-05-07 MED ORDER — MIDAZOLAM HCL 2 MG/2ML IJ SOLN
INTRAMUSCULAR | Status: DC | PRN
  Administered 2016-05-07: 1 mg via INTRAVENOUS
  Administered 2016-05-07: 2 mg via INTRAVENOUS
  Administered 2016-05-07: 1 mg via INTRAVENOUS

## 2016-05-07 MED ORDER — SODIUM CHLORIDE 0.9 % IV SOLN
INTRAVENOUS | Status: DC
  Administered 2016-05-07: 07:00:00 via INTRAVENOUS

## 2016-05-07 MED ORDER — SODIUM CHLORIDE 0.9 % IV SOLN: 1.0000 mL/kg/h | INTRAVENOUS | Status: DC

## 2016-05-07 MED ORDER — OXYCODONE HCL 5 MG PO TABS
5.0000 mg | ORAL_TABLET | ORAL | Status: DC | PRN
  Administered 2016-05-07: 5 mg via ORAL

## 2016-05-07 MED ORDER — LIDOCAINE HCL (PF) 1 % IJ SOLN
INTRAMUSCULAR | Status: AC
  Filled 2016-05-07: qty 30

## 2016-05-07 MED ORDER — HEPARIN SODIUM (PORCINE) 1000 UNIT/ML IJ SOLN
INTRAMUSCULAR | Status: DC | PRN
  Administered 2016-05-07: 1000 [IU] via INTRAVENOUS
  Administered 2016-05-07: 6000 [IU] via INTRAVENOUS
  Administered 2016-05-07: 3000 [IU] via INTRAVENOUS

## 2016-05-07 MED ORDER — HYDRALAZINE HCL 20 MG/ML IJ SOLN: 5.0000 mg | INTRAMUSCULAR | Status: DC | PRN

## 2016-05-07 MED ORDER — MORPHINE SULFATE (PF) 10 MG/ML IV SOLN: 2.0000 mg | INTRAVENOUS | Status: DC | PRN

## 2016-05-07 MED ORDER — PHENOL 1.4 % MT LIQD: 1.0000 | OROMUCOSAL | Status: DC | PRN

## 2016-05-07 MED ORDER — LIDOCAINE HCL (PF) 1 % IJ SOLN
INTRAMUSCULAR | Status: DC | PRN
  Administered 2016-05-07: 12 mL via SUBCUTANEOUS

## 2016-05-07 MED ORDER — HEPARIN (PORCINE) IN NACL 2-0.9 UNIT/ML-% IJ SOLN
INTRAMUSCULAR | Status: DC | PRN
  Administered 2016-05-07: 1000 mL via INTRA_ARTERIAL

## 2016-05-07 MED ORDER — MIDAZOLAM HCL 2 MG/2ML IJ SOLN
INTRAMUSCULAR | Status: AC
  Filled 2016-05-07: qty 2

## 2016-05-07 MED ORDER — ALUM & MAG HYDROXIDE-SIMETH 200-200-20 MG/5ML PO SUSP: 15.0000 mL | ORAL | Status: DC | PRN

## 2016-05-07 MED ORDER — FENTANYL CITRATE (PF) 100 MCG/2ML IJ SOLN
INTRAMUSCULAR | Status: DC | PRN
  Administered 2016-05-07: 25 ug via INTRAVENOUS
  Administered 2016-05-07: 50 ug via INTRAVENOUS
  Administered 2016-05-07: 25 ug via INTRAVENOUS

## 2016-05-07 MED ORDER — IODIXANOL 320 MG/ML IV SOLN
INTRAVENOUS | Status: DC | PRN
  Administered 2016-05-07: 135 mL via INTRA_ARTERIAL

## 2016-05-07 MED ORDER — ONDANSETRON HCL 4 MG/2ML IJ SOLN: 4.0000 mg | Freq: Four times a day (QID) | INTRAMUSCULAR | Status: DC | PRN

## 2016-05-07 MED ORDER — GUAIFENESIN-DM 100-10 MG/5ML PO SYRP: 15.0000 mL | ORAL_SOLUTION | ORAL | Status: DC | PRN

## 2016-05-07 MED ORDER — DOCUSATE SODIUM 100 MG PO CAPS: 100.0000 mg | ORAL_CAPSULE | Freq: Every day | ORAL | Status: DC

## 2016-05-07 MED ORDER — OXYCODONE HCL 5 MG PO TABS
ORAL_TABLET | ORAL | Status: DC
Start: 2016-05-07 — End: 2016-05-07
  Filled 2016-05-07: qty 1

## 2016-05-07 MED ORDER — HEPARIN (PORCINE) IN NACL 2-0.9 UNIT/ML-% IJ SOLN
INTRAMUSCULAR | Status: AC
  Filled 2016-05-07: qty 1000

## 2016-05-07 MED ORDER — ACETAMINOPHEN 325 MG RE SUPP: 325.0000 mg | RECTAL | Status: DC | PRN

## 2016-05-07 MED ORDER — HEPARIN SODIUM (PORCINE) 1000 UNIT/ML IJ SOLN
INTRAMUSCULAR | Status: AC
  Filled 2016-05-07: qty 1

## 2016-05-07 MED ORDER — FENTANYL CITRATE (PF) 100 MCG/2ML IJ SOLN
INTRAMUSCULAR | Status: AC
  Filled 2016-05-07: qty 2

## 2016-05-07 MED ORDER — METOPROLOL TARTRATE 5 MG/5ML IV SOLN: 2.0000 mg | INTRAVENOUS | Status: DC | PRN

## 2016-05-07 MED ORDER — LABETALOL HCL 5 MG/ML IV SOLN: 10.0000 mg | INTRAVENOUS | Status: DC | PRN

## 2016-05-07 SURGICAL SUPPLY — 32 items
BALLN ANGIOSCULPT 6X40 (BALLOONS) ×3
BALLN LUTONIX DCB 5X40X130 (BALLOONS) ×3
BALLN LUTONIX DCB 6X40X130 (BALLOONS) ×3
BALLN MUSTANG 6.0X40 75 (BALLOONS) ×6
BALLOON ANGIOSCULPT 6X40 (BALLOONS) IMPLANT
BALLOON LUTONIX DCB 5X40X130 (BALLOONS) IMPLANT
BALLOON LUTONIX DCB 6X40X130 (BALLOONS) IMPLANT
BALLOON MUSTANG 6.0X40 75 (BALLOONS) IMPLANT
CATH ANGIO 5F BER2 100CM (CATHETERS) ×1 IMPLANT
CATH ANGIO 5F BER2 65CM (CATHETERS) ×1 IMPLANT
CATH ANGIO 5F PIGTAIL 100CM (CATHETERS) ×2 IMPLANT
CATH CROSS OVER TEMPO 5F (CATHETERS) ×1 IMPLANT
COVER PRB 48X5XTLSCP FOLD TPE (BAG) IMPLANT
COVER PROBE 5X48 (BAG) ×3
DEVICE CONTINUOUS FLUSH (MISCELLANEOUS) ×1 IMPLANT
DRAPE ZERO GRAVITY STERILE (DRAPES) ×1 IMPLANT
KIT ENCORE 26 ADVANTAGE (KITS) ×1 IMPLANT
KIT MICROINTRODUCER STIFF 5F (SHEATH) ×1 IMPLANT
KIT PV (KITS) ×3 IMPLANT
SHEATH BRITE TIP 6FR 90CM (SHEATH) ×1 IMPLANT
SHEATH FLEX ANSEL ST 6FR 45CM (SHEATH) ×1 IMPLANT
SHEATH PINNACLE 5F 10CM (SHEATH) ×1 IMPLANT
SHEATH PINNACLE 6F 10CM (SHEATH) ×1 IMPLANT
SHEATH PINNACLE 7F 10CM (SHEATH) ×1 IMPLANT
SHEATH SHUTTLE SELECT 6F (SHEATH) ×1 IMPLANT
STENT INNOVA 7X60X130 (Permanent Stent) ×1 IMPLANT
SYR MEDRAD MARK V 150ML (SYRINGE) ×3 IMPLANT
TRANSDUCER W/STOPCOCK (MISCELLANEOUS) ×3 IMPLANT
TRAY PV CATH (CUSTOM PROCEDURE TRAY) ×3 IMPLANT
WIRE BENTSON .035X145CM (WIRE) ×1 IMPLANT
WIRE HI TORQ VERSACORE J 260CM (WIRE) ×1 IMPLANT
WIRE SPARTACORE .014X300CM (WIRE) ×1 IMPLANT

## 2016-05-07 NOTE — Progress Notes (Signed)
Site area: Left groin a 6 french arterial sheath was removed Site Prior to Removal:  Level 0  Pressure Applied For 20 MINUTES    Bedrest Beginning at 1210p  Manual:   Yes.    Patient Status During Pull:  stable  Post Pull Groin Site:  Level 0  Post Pull Instructions Given:  Yes.    Post Pull Pulses Present:  Yes.    Dressing Applied:  Yes.    Comments:  VS remain stable during sheath pull.  Pressure dressing applied.

## 2016-05-07 NOTE — Discharge Instructions (Signed)
Angiogram, Care After °Refer to this sheet in the next few weeks. These instructions provide you with information about caring for yourself after your procedure. Your health care provider may also give you more specific instructions. Your treatment has been planned according to current medical practices, but problems sometimes occur. Call your health care provider if you have any problems or questions after your procedure. °WHAT TO EXPECT AFTER THE PROCEDURE °After your procedure, it is typical to have the following: °· Bruising at the catheter insertion site that usually fades within 1-2 weeks. °· Blood collecting in the tissue (hematoma) that may be painful to the touch. It should usually decrease in size and tenderness within 1-2 weeks. °HOME CARE INSTRUCTIONS °· Take medicines only as directed by your health care provider. °· You may shower 24-48 hours after the procedure or as directed by your health care provider. Remove the bandage (dressing) and gently wash the site with plain soap and water. Pat the area dry with a clean towel. Do not rub the site, because this may cause bleeding. °· Do not take baths, swim, or use a hot tub until your health care provider approves. °· Check your insertion site every day for redness, swelling, or drainage. °· Do not apply powder or lotion to the site. °· Do not lift over 10 lb (4.5 kg) for 5 days after your procedure or as directed by your health care provider. °· Ask your health care provider when it is okay to: °¨ Return to work or school. °¨ Resume usual physical activities or sports. °¨ Resume sexual activity. °· Do not drive home if you are discharged the same day as the procedure. Have someone else drive you. °· You may drive 24 hours after the procedure unless otherwise instructed by your health care provider. °· Do not operate machinery or power tools for 24 hours after the procedure or as directed by your health care provider. °· If your procedure was done as an  outpatient procedure, which means that you went home the same day as your procedure, a responsible adult should be with you for the first 24 hours after you arrive home. °· Keep all follow-up visits as directed by your health care provider. This is important. °SEEK MEDICAL CARE IF: °· You have a fever. °· You have chills. °· You have increased bleeding from the catheter insertion site. Hold pressure on the site.  CALL 911 °SEEK IMMEDIATE MEDICAL CARE IF: °· You have unusual pain at the catheter insertion site. °· You have redness, warmth, or swelling at the catheter insertion site. °· You have drainage (other than a small amount of blood on the dressing) from the catheter insertion site. °· The catheter insertion site is bleeding, and the bleeding does not stop after 30 minutes of holding steady pressure on the site. °· The area near or just beyond the catheter insertion site becomes pale, cool, tingly, or numb. °  °This information is not intended to replace advice given to you by your health care provider. Make sure you discuss any questions you have with your health care provider. °  °Document Released: 05/23/2005 Document Revised: 11/25/2014 Document Reviewed: 04/07/2013 °Elsevier Interactive Patient Education ©2016 Elsevier Inc. ° °

## 2016-05-07 NOTE — Progress Notes (Addendum)
Pt c/o of pain left groin to mid abdomen center/ spasm like 3/10 pain but has sharp pains and those are 9/10 pain, Aaron Edelman cath lab tech came to assess whils I held pressure, no hematoma found. Dr Trula Slade will be called and updated, pain meds given. Pt denies need to void. No further orders followed.

## 2016-05-07 NOTE — Op Note (Signed)
Patient name: [name] MRN: [id number] DOB: 26-Feb-1966 Sex: female  05/07/2016 Pre-operative Diagnosis: #1: Bilateral claudication     #2: Bilateral upper extremity numbness Post-operative diagnosis:  Same Surgeon:  [name] Procedure Performed:  1.  Ultrasound-guided access, left femoral artery  2.  Aortic arch angiogram  3.  Second order catheterization (right subclavian artery) with right subclavian angiogram  4.  Drug coated balloon and plasty, right subclavian artery  5.  Abdominal aortogram with bifemoral runoff  6.  Third order catheterization (right external iliac artery)  7.  Drug coated balloon angioplasty, right external iliac artery  8.  Stent, right external iliac artery  9.  Conscious sedation (7:42-9:40)   Indications:  The patient has a history of bilateral femoral-popliteal bypass graft.  She has had multiple interventions for recurrent stenosis.  She now has elevated velocities on ultrasound as well as a new complaint of numbness in both hands.  She is here today for further evaluation and possible intervention  Procedure:  The patient was identified in the holding area and taken to room 8.  The patient was then placed supine on the table and prepped and draped in the usual sterile fashion.  A time out was called.  Conscious sedation was performed with the use of IV fentanyl and Versed under continuous physician and circulating nurse monitoring.  We will present for the entire sedation portion of the procedure.  Heart rate and blood pressure and oxygen saturation were continuously monitored Ultrasound was used to evaluate the left common femoral artery.  It was patent .  A digital ultrasound image was acquired.  A micropuncture needle was used to access the left common femoral artery under ultrasound guidance.  An 018 wire was advanced without resistance and a micropuncture sheath was placed.  The 018 wire was removed and a benson wire was placed.  The  micropuncture sheath was exchanged for a 5 french sheath.  A pigtail catheter was advanced to the ascending aorta and an aortic arch Joetta Manners was performed.  Next using a Berenstein 2 catheter, the subclavian artery was selected and the subclavian artery and injection was performed on the right.  Following intervention, the pigtail catheter was placed into the infrarenal abdominal aorta and a aortobifemoral image was acquired and multiple obliquities.  Findings:   Aortic arch: Type I aortic arch is identified.  The innominate artery is widely patent.  The origin of the left common carotid artery is widely patent.  There is occlusion of the left subclavian artery at its origin with reconstitution from retrograde flow through the left vertebral artery 80% right subclavian artery stenosis  Aortogram:  The visualized portions of the distal abdominal aorta showed no significant stenosis  Right Lower Extremity:  The right common iliac artery is widely patent.  The right hypogastric artery is occluded.  There is a high-grade, 80% stenosis within the right external iliac artery.  The right common femoral performed femoral and origin of the right femoral-popliteal bypass graft with associated stent appeared widely patent  Left Lower Extremity:  Left common external and internal iliac artery are widely patent.  The proximal anastomosis of the left femoral-popliteal bypass graft is widely patent as is the left common femoral and profunda femoral artery  Intervention:  After the above images were acquired the decision was made to proceed with intervention.  A long 90 cm 6 French sheath was advanced into the right subclavian artery.  The patient was fully heparinized.  I elected to perform drug coated balloon angioplasty using a Lutonix 5 x 40 balloon.  The balloon was positioned just beyond the origin of the right vertebral artery.  This was taken to nominal pressure for 2 minutes.  Completion imaging was then  performed which showed stenosis decreased down to less than 10% from 80%.  No dissection was identified.  Next, attention was turned towards the stenosis in the right external iliac artery.  The 6 x90 sheath was exchanged for a 6 x 40 sheath.  Using a crossover catheter a versa core wire was advanced into the right femoral artery and the 645 sheath was advanced over the bifurcation into the right external iliac artery.  I initially decided to perform scoring balloon angioplasty using a Angiosculpt 6 x 40 balloon.  This was taken up to profile for 2 minutes.  This was followed by drug coated balloon angioplasty using a Lutonix 6 x 40 balloon.  Completion imaging showed a persistent dissection flap which I felt needed to be stented.  Therefore I selected a Chemical engineer 7 x 60 ANOVA self-expanding stent which was deployed and molded with a 6 mm balloon.  Completion imaging showed resolution of the stenosis with no evidence of residual dissection.  Residual stenosis was 0%.  Catheters and wires were removed.  The patient taken the holding area for sheath pull once her coagulation profile corrects.  Impression:  #1  80% stenosis within the right subclavian artery just beyond the origin of the vertebral artery.  This was successfully treated using a drug coated balloon and plasty using a 5 x 40 Lutonix balloon.  Residual stenosis was less than 10%  #2  80% right external iliac stenosis initially treated with drug coated balloon angioplasty and a scoring balloon, both of which were 6 mm.  There was a residual dissection and therefore this was treated by stenting using a 7 x 60 ANOVA self-expanding stent with resolution of the stenosis  #3  widely patent proximal bilateral femoral-popliteal bypass grafts  #4  occluded left subclavian artery    V. [name], M.D. [facility] Office: [phone] Pager:  [phone]

## 2016-05-07 NOTE — H&P (Signed)
Patient name: Jamie Lamica MilesMRN: [id number] DOB: 11-Apr-1967Sex: female    Chief Complaint  Patient presents with  . evaluation numbness/tingling bilat hands    Bilat Subclavian Artery Occlusion per carotid artery duplex on 4/10    HISTORY OF PRESENT ILLNESS: The patient comes back today for followup. She is status post right femoral to below knee popliteal artery bypass graft with vein on 07/05/2010 for nonhealing ulcer, and left femoral to below knee popliteal artery bypass graft with vein on 06/27/2011 . She had a high-grade stenosis identified at the proximal anastomosis on the right and on 06/15/2014 she underwent drug coated balloon angioplasty with a 6 mm balloon.Follow-up ultrasound revealed a recurrence of the stenosis and she was taken back for angiography on 11/22/2014. She had a recurrent high-grade stenosis within the proximal bypass graft. A dissection was created after balloon angioplasty that did not respond to repeat angioplasty therefore a 6 x 30 self-expanding stent was deployed.  Her most recent intervention was on 11/22/2014. She recently underwent ultrasound. She was found to have elevated velocities within the left external iliac artery. The patient was also complaining of numbness in both hands. Ultrasound identified bilateral subclavian disease.  The patient states that she has been having trouble with her arms for a long time. She has attributed this to neuropathy. She states that with minimal activity they give out.  Past Medical History  Diagnosis Date  . Diabetes mellitus   . PAD (peripheral artery disease) ([place])   . Chronic back pain   . Asthma   . Hyperlipidemia   . Leg pain     With Walking  . Depression   . Arthritis   . Ulcer     Foot  . Reflux   . Joint pain   . Peripheral arterial disease [facility])     Past Surgical History  Procedure Laterality  Date  . Arterial bypass surgry  07/05/2010    Right Common Femoral to below knee popliteal BPG  . Skin graft Right 2012    [name] by Dr. Nils Pyle- Right and Left Ankle  . Back surgery      X's 2  . Tonsillectomy    . Spine surgery    . Cholecystectomy      Gall Bladder  . Abdominal aortagram  June 15, 2014  . Intercostal nerve block  November 2015  . Abdominal aortagram N/A 06/15/2014    Procedure: ABDOMINAL Maxcine Ham; Surgeon: Serafina Mitchell, MD; Location: [facility] CATH LAB; Service: Cardiovascular; Laterality: N/A;  . Abdominal aortagram N/A 11/22/2014    Procedure: ABDOMINAL AORTAGRAM; Surgeon: Serafina Mitchell, MD; Location: [facility] CATH LAB; Service: Cardiovascular; Laterality: N/A;  . Cystectomy Right     foot  . Cystectomy Left     wrist  . Left foot surgery    . Left wrist cyst removal Left     Social History   Social History  . Marital Status: Single    Spouse Name: N/A  . Number of Children: N/A  . Years of Education: N/A   Occupational History  . Not on file.   Social History Main Topics  . Smoking status: Current Every Day Smoker -- 0.20 packs/day for 30 years    Types: Cigarettes  . Smokeless tobacco: Never Used     Comment: 2 cigarrettes a day  . Alcohol Use: No  . Drug Use: No  . Sexual Activity: Yes    Birth Control/ Protection: Post-menopausal   Other Topics Concern  .  Not on file   Social History Narrative    Family History  Problem Relation Age of Onset  . Coronary artery disease Mother   . Peripheral vascular disease Mother   . Heart disease Mother     Before age 58  . Other Mother     Venous insuffiency  . Diabetes Mother   . Hyperlipidemia Mother   . Hypertension Mother   . Varicose Veins Mother   . Heart attack Mother     before age 18  . Heart  disease Father   . Diabetes Father   . Diabetes Sister   . Hypertension Sister   . Diabetes Brother   . Hypertension Brother   . Diabetes Maternal Grandmother   . Diabetes Paternal Grandmother   . Diabetes Paternal Grandfather     Allergies as of 03/05/2016 - Review Complete 03/05/2016  Allergen Reaction Noted  . Penicillins Other (See Comments) 06/24/2011    Current Outpatient Prescriptions on File Prior to Visit  Medication Sig Dispense Refill  . ACCU-CHEK AVIVA PLUS test strip     . ACCU-CHEK SOFTCLIX LANCETS lancets     . albuterol (PROVENTIL HFA;VENTOLIN HFA) 108 (90 Base) MCG/ACT inhaler Inhale 2 puffs into the lungs as needed for wheezing or shortness of breath. Reported on 02/28/2016    . albuterol (PROVENTIL) 2 MG tablet Take 2 mg by mouth 3 (three) times daily.     Marland Kitchen aspirin 81 MG tablet Take 81 mg by mouth daily.    . Blood Glucose Monitoring Suppl (ACCU-CHEK AVIVA PLUS) w/Device KIT     . cetirizine (ZYRTEC) 10 MG tablet Take 10 mg by mouth daily.    . Cholecalciferol (VITAMIN D3) 50000 units CAPS Take 50,000 Units by mouth once a week. Reported on 02/28/2016    . Choline Fenofibrate (FENOFIBRIC ACID) 45 MG CPDR     . ciprofloxacin (CIPRO) 250 MG tablet Take 1 tablet (250 mg total) by mouth 2 (two) times daily. 14 tablet 0  . ciprofloxacin (CIPRO) 250 MG tablet Take 1 tablet (250 mg total) by mouth 2 (two) times daily. 14 tablet 0  . cyclobenzaprine (FLEXERIL) 10 MG tablet Take 10 mg by mouth 3 (three) times daily as needed for muscle spasms. Reported on 11/22/2015    . cyclobenzaprine (FLEXERIL) 10 MG tablet Limit 1 tablet by mouth every 8-12 hours if tolerated 90 tablet 0  . doxycycline (VIBRA-TABS) 100 MG tablet Reported on 02/28/2016    . furosemide (LASIX) 40 MG tablet Take 40 mg by mouth 2 (two) times daily as needed for fluid.     Marland Kitchen HUMALOG 100  UNIT/ML injection Reported on 02/28/2016    . HUMALOG KWIKPEN 100 UNIT/ML KiwkPen     . HYDROcodone-acetaminophen (NORCO) 10-325 MG tablet Limit 1 tablet by mouth per day or twice per day if tolerated 60 tablet 0  . insulin aspart (NOVOLOG) 100 UNIT/ML injection Inject 10 Units into the skin 3 (three) times daily before meals. Reported on 02/05/2016    . LANTUS SOLOSTAR 100 UNIT/ML Solostar Pen Inject 60 Units into the skin at bedtime.     Marland Kitchen loperamide (IMODIUM A-D) 2 MG tablet Take 2 mg by mouth as needed for diarrhea or loose stools.    . meclizine (ANTIVERT) 25 MG tablet Take 25 mg by mouth 3 (three) times daily as needed for dizziness.    . naproxen sodium (ANAPROX) 220 MG tablet Take 220 mg by mouth 2 (two) times daily as needed (pain). Reported on  02/28/2016    . omeprazole (PRILOSEC) 20 MG capsule Take 20 mg by mouth daily.    . pregabalin (LYRICA) 100 MG capsule Take 100 mg by mouth 3 (three) times daily.    . pregabalin (LYRICA) 100 MG capsule Limit 1 tab by mouth twice a day to 3 times a day if tolerated 90 capsule 0  . pseudoephedrine-guaifenesin (MUCINEX D) 60-600 MG per tablet Take 1 tablet by mouth every 12 (twelve) hours as needed for congestion. Reported on 02/28/2016    . Vitamin D, Ergocalciferol, (DRISDOL) 50000 UNITS CAPS capsule Take 50,000 Units by mouth every 7 (seven) days. Reported on 02/28/2016    . simethicone (MYLICON) 053 MG chewable tablet Chew 125 mg by mouth every 6 (six) hours as needed for flatulence.     Current Facility-Administered Medications on File Prior to Visit  Medication Dose Route Frequency Provider Last Rate Last Dose  . 0.9 % sodium chloride infusion  Intravenous Continuous Serafina Mitchell, MD    . bupivacaine (PF) (MARCAINE) 0.25 % injection 30 mL 30 mL Other Once Mohammed Kindle, MD    . ciprofloxacin (CIPRO) IVPB 400 mg 400 mg Intravenous Once  Mohammed Kindle, MD    . ciprofloxacin (CIPRO) IVPB 400 mg 400 mg Intravenous Once Mohammed Kindle, MD    . ciprofloxacin (CIPRO) tablet 250 mg 250 mg Oral BID Mohammed Kindle, MD    . fentaNYL (SUBLIMAZE) injection 100 mcg 100 mcg Intravenous Once Mohammed Kindle, MD    . fentaNYL (SUBLIMAZE) injection 100 mcg 100 mcg Intravenous Once Mohammed Kindle, MD    . lactated ringers infusion 1,000 mL 1,000 mL Intravenous Continuous Mohammed Kindle, MD    . lactated ringers infusion 1,000 mL 1,000 mL Intravenous Continuous Mohammed Kindle, MD    . lactated ringers infusion 1,000 mL 1,000 mL Intravenous Continuous Mohammed Kindle, MD    . lidocaine (PF) (XYLOCAINE) 1 % injection 10 mL 10 mL Subcutaneous Once Mohammed Kindle, MD    . lidocaine (PF) (XYLOCAINE) 1 % injection 10 mL 10 mL Subcutaneous Once Mohammed Kindle, MD    . midazolam (VERSED) 5 MG/5ML injection 5 mg 5 mg Intravenous Once Mohammed Kindle, MD    . midazolam (VERSED) 5 MG/5ML injection 5 mg 5 mg Intravenous Once Mohammed Kindle, MD    . orphenadrine (NORFLEX) injection 60 mg 60 mg Intramuscular Once Mohammed Kindle, MD    . orphenadrine (NORFLEX) injection 60 mg 60 mg Intramuscular Once Mohammed Kindle, MD    . orphenadrine (NORFLEX) injection 60 mg 60 mg Intramuscular Once Mohammed Kindle, MD    . sodium chloride 0.9 % injection 20 mL 20 mL Other Once Mohammed Kindle, MD    . sodium chloride 0.9 % injection 20 mL 20 mL Other Once Mohammed Kindle, MD    . triamcinolone acetonide (KENALOG-40) injection 40 mg 40 mg Other Once Mohammed Kindle, MD    . triamcinolone acetonide (KENALOG-40) injection 40 mg 40 mg Other Once Mohammed Kindle, MD       REVIEW OF SYSTEMS: Cardiovascular: No chest pain,Arm weakness with activity. Leg weakness with activity. Pulmonary: No productive  cough, asthma or wheezing. Neurologic: No weakness, paresthesias, loss of vision in right eye secondary to diabetes Hematologic: No bleeding problems or clotting disorders. Musculoskeletal: No joint pain or joint swelling. Gastrointestinal: No blood in stool or hematemesis Genitourinary: No dysuria or hematuria. Psychiatric:: No history of major depression. Integumentary: No rashes or ulcers. Constitutional: No fever or chills.  PHYSICAL EXAMINATION:  Vital signs are  Filed Vitals:   03/05/16 1227  BP: 101/64  Pulse: 100  Temp: 97.6 F (36.4 C)  TempSrc: Oral  Resp: 16  Height: 5' 7.5" (1.715 m)  Weight: 163 lb 8 oz (74.163 kg)   Body mass index is 25.22 kg/(m^2). General: The patient appears their stated age. HEENT: No gross abnormalities Pulmonary: Non labored breathing Musculoskeletal: There are no major deformities. Neurologic: No focal weakness or paresthesias are detected, Skin: There are no ulcer or rashes noted. Psychiatric: The patient has normal affect. Cardiovascular: There is a regular rate and rhythm without significant murmur appreciated.Radial pulses are not palpable bilateral   Diagnostic Studies I have reviewed her duplex ultrasound. This shows elevated velocities within the left external iliac artery. Both femoral-popliteal bypass grafts are widely patent. Bilateral native artery inflow stenosis  Carotid duplex shows 40-59% bilateral carotid stenosis. There is a focal right subclavian stenosis just beyond the vertebral artery origin. The left subclavian artery appears occluded  Assessment: Atherosclerosis with claudication, bilateral upper and lower extremity Plan: I will plan on proceeding with angiography to evaluate bilateral inflow stenosis. The most prominent appears to be the left. I will intervene on the iliac artery on the left or right depending on the degree of stenosis. In addition I'll proceed with aortic arch  angiography in order to get more formation about her subclavian artery disease. There is a possibility that she would be a candidate for percutaneous intervention on the right. I would proceed at this time if she is a candidate. She would like to delay this until after she gets back from vacation so she is been scheduled for late June  V. Leia Alf, M.D. [facility] [place] Office: [phone] Pager: [phone]       No interval changes.  Quit smoking for 1 week but had a cigarette this am CV:RRR Pulm:CTA Neuro intact Plan: angio with posisble bilateral UE and LE intervention   [place] Laloni Rowton

## 2016-05-07 NOTE — Progress Notes (Signed)
Pt sleeping flat.

## 2016-05-07 NOTE — Progress Notes (Signed)
Pt c/o jumpy legs and takes lyrica 100mg  3 x day, Dr Trula Slade was called and take home meds.

## 2016-05-08 ENCOUNTER — Encounter (HOSPITAL_COMMUNITY): Payer: Self-pay | Admitting: Surgery

## 2016-05-09 ENCOUNTER — Telehealth: Payer: Self-pay | Admitting: *Deleted

## 2016-05-09 NOTE — Telephone Encounter (Signed)
Nurses  Please call patient and discussed reason for hospitalization and medication and doses of medications patient received so that patient can be instructed on what to do and medications can be documented

## 2016-05-09 NOTE — Telephone Encounter (Signed)
Pt called stating that when she was in the hospital they gave her a different pain med. Pt stated that she does not want to get in trouble with Dr. Primus Bravo and is wanting to discuss her meds with a nurse. Please give her a call...thanks

## 2016-05-09 NOTE — Telephone Encounter (Signed)
Spoke with patient.  She has no idea what they gave her for pain.  States she was in hospital on 05-07-16 for catheterization.

## 2016-05-10 NOTE — Telephone Encounter (Signed)
Thank you very much for follow-up and for explanation regarding this issue

## 2016-05-10 NOTE — Telephone Encounter (Signed)
Patient was questioned about reason for hospitalization and medication.  As stated below, patient has no idea what medication she was given and was at the hospital for a catheterization on 05-07-16.  Called patient again and discussed.  States she was only at the hospital for an outpatient catheterization.  Was given pain medicine during procedure.  She does not know what it was.  She was not given any prescriptions.  She is taking only Dr Milana Na pain medications as prescribed.  She was just informing us that she was given something during procedure.

## 2016-05-16 ENCOUNTER — Encounter: Payer: Self-pay | Admitting: Pain Medicine

## 2016-05-16 ENCOUNTER — Ambulatory Visit: Payer: Medicare Other | Attending: Pain Medicine | Admitting: Pain Medicine

## 2016-05-16 VITALS — BP 103/73 | HR 53 | Temp 97.8°F | Resp 16 | Ht 67.0 in | Wt 162.0 lb

## 2016-05-16 DIAGNOSIS — M62830 Muscle spasm of back: Secondary | ICD-10-CM | POA: Insufficient documentation

## 2016-05-16 DIAGNOSIS — M5134 Other intervertebral disc degeneration, thoracic region: Secondary | ICD-10-CM | POA: Diagnosis not present

## 2016-05-16 DIAGNOSIS — M4806 Spinal stenosis, lumbar region: Secondary | ICD-10-CM | POA: Insufficient documentation

## 2016-05-16 DIAGNOSIS — M25559 Pain in unspecified hip: Secondary | ICD-10-CM

## 2016-05-16 DIAGNOSIS — M533 Sacrococcygeal disorders, not elsewhere classified: Secondary | ICD-10-CM | POA: Insufficient documentation

## 2016-05-16 DIAGNOSIS — R51 Headache: Secondary | ICD-10-CM | POA: Diagnosis present

## 2016-05-16 DIAGNOSIS — M5136 Other intervertebral disc degeneration, lumbar region: Secondary | ICD-10-CM | POA: Insufficient documentation

## 2016-05-16 DIAGNOSIS — M5137 Other intervertebral disc degeneration, lumbosacral region: Secondary | ICD-10-CM

## 2016-05-16 DIAGNOSIS — I739 Peripheral vascular disease, unspecified: Secondary | ICD-10-CM

## 2016-05-16 DIAGNOSIS — E1142 Type 2 diabetes mellitus with diabetic polyneuropathy: Secondary | ICD-10-CM | POA: Diagnosis not present

## 2016-05-16 DIAGNOSIS — M47816 Spondylosis without myelopathy or radiculopathy, lumbar region: Secondary | ICD-10-CM

## 2016-05-16 DIAGNOSIS — M5481 Occipital neuralgia: Secondary | ICD-10-CM | POA: Insufficient documentation

## 2016-05-16 DIAGNOSIS — M542 Cervicalgia: Secondary | ICD-10-CM | POA: Diagnosis present

## 2016-05-16 DIAGNOSIS — M5030 Other cervical disc degeneration, unspecified cervical region: Secondary | ICD-10-CM | POA: Diagnosis not present

## 2016-05-16 DIAGNOSIS — M546 Pain in thoracic spine: Secondary | ICD-10-CM | POA: Diagnosis present

## 2016-05-16 DIAGNOSIS — I7092 Chronic total occlusion of artery of the extremities: Secondary | ICD-10-CM

## 2016-05-16 MED ORDER — PREGABALIN 100 MG PO CAPS: ORAL_CAPSULE | ORAL | Status: DC

## 2016-05-16 MED ORDER — HYDROCODONE-ACETAMINOPHEN 10-325 MG PO TABS: ORAL_TABLET | ORAL | Status: DC

## 2016-05-16 NOTE — Progress Notes (Signed)
   Subjective:    Patient ID: [name], female    DOB: Jan 12, 1966, 50 y.o.   MRN: [id number]  HPI  Patient is a 50 year old female who returns to pain management for further evaluation and treatment of headaches as well as pain involving the neck entire back upper and lower extremity regions. The patient states that that she does have pain involving the lower back lower extremity region. We discussed patient's condition at the present time we will await patient undergo further vascular evaluation of the lower extremities. There is concern regarding patient's pain of the lower extremities being due to both vascular component as well as intraspinal component. We will continue patient's medications consisting of hydrocodone acetaminophen and Flexeril and Lyrica. The patient was in agreement with suggested treatment plan     Review of Systems     Objective:   Physical Exam  There was tenderness of paraspinal misreading cervical region cervical facet region palpation which reproduces pain of right degree with moderate tenderness of the splenius capitis and occipitalis region. No masses of the head and neck were noted. Palpation of the thoracic region was attends to palpation without crepitus of the thoracic region was evidence of moderate muscle spasm of the thoracic paraspinal musculature region of the upper mid regions especially. Palpation of the lumbar region lumbar paraspinal musculature region was attends to palpation of moderate degree. Lateral bending rotation extension and palpation of the lumbar facets reproduced moderate discomfort. Straight leg raise was tolerates approximately 30 without definite increase of pain with dorsiflexion noted. DTRs were difficult to elicit. EHL strength appeared to be decreased pulses of the lower extremities appeared to be decreased and pain There was negative clonus negative Homans. Abdomen nontender with no costovertebral tenderness noted    Assessment  & Plan:      Degenerative disc disease lumbar spine  Lumbar facet syndrome  Lumbar stenosis  Diabetes mellitus with diabetic neuropathy/vasculopathy  Sacroiliac joint dysfunction  Greater occipital neuralgia  Degenerative disc disease of the cervical spine  Cervical facet syndrome  Degenerative disc disease of the thoracic spine  Thoracic facet syndrome     PLAN   Continue present medication Lyrica, Flexeril, and hydrocodone acetaminophen  F/U PCP Dr. Lennox Grumbles or other PCP  for evaliation of  BP diabetes mellitus and general medical  condition  F/U surgical evaluation. May consider pending follow-up evaluations  F/U vascular evaluation as discussed a shunt will follow-up with Dr.Braham for vascular reevaluation as discussed   F/U cardiac evaluationAs discussed  F/U neurological evaluation. May consider PNCV/EMG studies and other studies pending follow-up evaluations  May consider radiofrequency rhizolysis or intraspinal procedures pending response to present treatment and F/U evaluation   Patient to call [facility] should patient have concerns prior to scheduled return appointment.

## 2016-05-16 NOTE — Patient Instructions (Addendum)
PLAN   Continue present medication Lyrica, Flexeril, and hydrocodone acetaminophen  F/U PCP Dr. Lennox Grumbles or other PCP  for evaliation of  BP diabetes mellitus and general medical  condition  F/U surgical evaluation. May consider pending follow-up evaluations  F/U vascular evaluation as discussed a shunt will follow-up with Dr.Braham for vascular reevaluation as discussed   F/U cardiac evaluationAs discussed  F/U neurological evaluation. May consider PNCV/EMG studies and other studies pending follow-up evaluations  May consider radiofrequency rhizolysis or intraspinal procedures pending response to present treatment and F/U evaluation   Patient to call Pain Management Center should patient have concerns prior to scheduled return appointment.Pain Management Discharge Instructions  General Discharge Instructions :  If you need to reach your doctor call: Monday-Friday 8:00 am - 4:00 pm at [phone] or toll free [phone].  After clinic hours [phone] to have operator reach doctor.  Bring all of your medication bottles to all your appointments in the pain clinic.  To cancel or reschedule your appointment with Pain Management please remember to call 24 hours in advance to avoid a fee.  Refer to the educational materials which you have been given on: General Risks, I had my Procedure. Discharge Instructions, Post Sedation.  Post Procedure Instructions:  The drugs you were given will stay in your system until tomorrow, so for the next 24 hours you should not drive, make any legal decisions or drink any alcoholic beverages.  You may eat anything you prefer, but it is better to start with liquids then soups and crackers, and gradually work up to solid foods.  Please notify your doctor immediately if you have any unusual bleeding, trouble breathing or pain that is not related to your normal pain.  Depending on the type of procedure that was done, some parts of your body may feel  week and/or numb.  This usually clears up by tonight or the next day.  Walk with the use of an assistive device or accompanied by an adult for the 24 hours.  You may use ice on the affected area for the first 24 hours.  Put ice in a Ziploc bag and cover with a towel and place against area 15 minutes on 15 minutes off.  You may switch to heat after 24 hours.

## 2016-05-16 NOTE — Progress Notes (Signed)
Safety precautions to be maintained throughout the outpatient stay will include: orient to surroundings, keep bed in low position, maintain call bell within reach at all times, provide assistance with transfer out of bed and ambulation.  

## 2016-05-25 LAB — TOXASSURE SELECT 13 (MW), URINE: PDF: 0

## 2016-05-26 NOTE — Progress Notes (Signed)
Quick Note:  Reviewed. ______ 

## 2016-06-14 ENCOUNTER — Telehealth: Payer: Self-pay | Admitting: Surgery

## 2016-06-14 NOTE — Telephone Encounter (Addendum)
-----   Message from Mena Goes, RN sent at 05/07/2016 10:19 AM EDT ----- Regarding: schedule   ----- Message -----    From: Serafina Mitchell, MD    Sent: 05/07/2016   9:52 AM      To: [facility] Charge Pool  05/07/2016:  Surgeon:  [name] Procedure Performed:  1.  Ultrasound-guided access, left femoral artery  2.  Aortic arch angiogram  3.  Second order catheterization (right subclavian artery) with right subclavian angiogram  4.  Drug coated balloon and plasty, right subclavian artery  5.  Abdominal aortogram with bifemoral runoff  6.  Third order catheterization (right external iliac artery)  7.  Drug coated balloon angioplasty, right external iliac artery  8.  Stent, right external iliac artery  9.  Conscious sedation (7:42-9:40)   Follow-up 3 months with bilateral subclavian artery duplex and bilateral lower extremity duplex with ABIs she can see myself for [facility]  Patient scheduled for an appt on 09/02/16 at 11am for vascular studies and to see [facility] after. I mailed appt letter and called patient and left message. annette

## 2016-06-18 ENCOUNTER — Ambulatory Visit: Payer: Medicare Other | Attending: Pain Medicine | Admitting: Pain Medicine

## 2016-06-18 ENCOUNTER — Telehealth: Payer: Self-pay | Admitting: *Deleted

## 2016-06-18 ENCOUNTER — Encounter: Payer: Self-pay | Admitting: Pain Medicine

## 2016-06-18 VITALS — BP 99/59 | HR 100 | Temp 97.8°F | Resp 18 | Ht 67.0 in | Wt 162.0 lb

## 2016-06-18 DIAGNOSIS — I70209 Unspecified atherosclerosis of native arteries of extremities, unspecified extremity: Secondary | ICD-10-CM

## 2016-06-18 DIAGNOSIS — M533 Sacrococcygeal disorders, not elsewhere classified: Secondary | ICD-10-CM | POA: Diagnosis not present

## 2016-06-18 DIAGNOSIS — M5134 Other intervertebral disc degeneration, thoracic region: Secondary | ICD-10-CM | POA: Diagnosis not present

## 2016-06-18 DIAGNOSIS — M5481 Occipital neuralgia: Secondary | ICD-10-CM | POA: Diagnosis not present

## 2016-06-18 DIAGNOSIS — M51379 Other intervertebral disc degeneration, lumbosacral region without mention of lumbar back pain or lower extremity pain: Secondary | ICD-10-CM

## 2016-06-18 DIAGNOSIS — M5137 Other intervertebral disc degeneration, lumbosacral region: Secondary | ICD-10-CM

## 2016-06-18 DIAGNOSIS — E1140 Type 2 diabetes mellitus with diabetic neuropathy, unspecified: Secondary | ICD-10-CM | POA: Insufficient documentation

## 2016-06-18 DIAGNOSIS — M4806 Spinal stenosis, lumbar region: Secondary | ICD-10-CM | POA: Insufficient documentation

## 2016-06-18 DIAGNOSIS — M62830 Muscle spasm of back: Secondary | ICD-10-CM | POA: Insufficient documentation

## 2016-06-18 DIAGNOSIS — I739 Peripheral vascular disease, unspecified: Secondary | ICD-10-CM | POA: Insufficient documentation

## 2016-06-18 DIAGNOSIS — R51 Headache: Secondary | ICD-10-CM | POA: Diagnosis present

## 2016-06-18 DIAGNOSIS — M542 Cervicalgia: Secondary | ICD-10-CM | POA: Diagnosis present

## 2016-06-18 DIAGNOSIS — M5030 Other cervical disc degeneration, unspecified cervical region: Secondary | ICD-10-CM

## 2016-06-18 DIAGNOSIS — M5136 Other intervertebral disc degeneration, lumbar region: Secondary | ICD-10-CM | POA: Insufficient documentation

## 2016-06-18 MED ORDER — HYDROCODONE-ACETAMINOPHEN 10-325 MG PO TABS: ORAL_TABLET | ORAL | 0 refills | Status: DC

## 2016-06-18 MED ORDER — PREGABALIN 100 MG PO CAPS: ORAL_CAPSULE | ORAL | 0 refills | Status: DC

## 2016-06-18 MED ORDER — CYCLOBENZAPRINE HCL 10 MG PO TABS: ORAL_TABLET | ORAL | 0 refills | Status: DC

## 2016-06-18 NOTE — Progress Notes (Signed)
Safety precautions to be maintained throughout the outpatient stay will include: orient to surroundings, keep bed in low position, maintain call bell within reach at all times, provide assistance with transfer out of bed and ambulation.  

## 2016-06-18 NOTE — Telephone Encounter (Addendum)
LMTCB to discuss groin pain since stenting on 05-07-16 by Dr. Trula Slade.  Pt also sees Dr. Josefa Half for pain medications, she is on contract with them. According to cath op notes, patient was given Fentanyl and Versed for consicous sedation and Iodixanol for contrast dye. She was not given any medications postop or any po meds to get filled.

## 2016-06-18 NOTE — Progress Notes (Signed)
The patient is a 50 year old female who returns to pain management for further evaluation and treatment of pain involving the neck associated with headaches as well as pain of the upper extremities mid back lower back lower extremity region. The patient stated that she felt something involving the right lower extremity in terms of movement of foot patient described as a stent of the vessel of the right lower extremity. The patient is with history of peripheral vascular disease is undergone prior interventional treatment by Dr. Russella Dar. We discussed patient's condition on today's visit and decision was made to schedule patient for reevaluation of her vascular status by Dr. Russella Dar. The patient stated that the pain of the right lower extremity has increased since she noted the unusual sensation which occurred in the region of the inguinal area. Patient denied any trauma or other changes in events of daily living the call significant change in symptomatology. We informed patient that we will continue present medications at this time and that we will proceed with scheduling patient for reevaluation of her vascular status with Dr. Russella Dar and that we will remain available to consider patient for additional treatment including interventional treatment pending disposition of Dr. Renee Harder discussed patient undergoing lumbar synthetic block should patient be with continued symptoms without surgically correctable lesion and without plans for vascular intervention. The patient states that the right lower extremity appears to be cooler than usual. We informed patient that procedures such as lumbar sympathetic block could be beneficial in terms of improving the flow of the lower extremity and decrease in the sensation of coldness of the lower extremity. We will await surgical evaluation and disposition of Dr. Russella Dar at this time. All agreed to suggested treatment plan. The patient will follow-up with her primary  care physician for further assessment of her condition as well as discussed. All agreed to suggested treatment plan    Physical examination  There was tenderness of the splenius capitis and occipitalis musculature region. Palpation over the cervical facet cervical paraspinal musculature region was attends to palpation of moderate degree. There was moderate tenderness over the region of the acromioclavicular and glenohumeral joint region. Patient appeared to be with unremarkable Spurling's maneuver and appeared to be with slightly decreased grip strength without significant increase of pain with Tinel and Phalen's maneuver. Palpation of the thoracic region was with moderate muscle spasms with no crepitus of the thoracic region noted. Palpation over the lumbar region was a tennis to palpation of moderate degree with lateral bending rotation extension and palpation over the lumbar facets reproducing moderate discomfort. Straight leg raise was tolerates approximately 20 without a definite increase of pain with dorsiflexion noted. There was tenderness over the PSIS and PII S region a moderate degree. There was moderate tenderness along the greater trochanteric region iliotibial band region. There was tends to palpation of the lower extremity with negative clonus negative Homans. There was questionable decreased temperature of the lower extremity noted. No definite allodynia of the lower extremity noted. No definite sensory deficit or dermatomal dystrophy detected. The knees were tenderness to palpation in crepitus of the knees with negative anterior and posterior drawer signs. EHL strength appeared to be decreased. Nontender without excessive tends to palpation and no costovertebral tenderness noted      Assessment   Peripheral vascular disease  Diabetes mellitus with diabetic neuropathy/vasculopathy  Degenerative disc disease lumbar spine  Lumbar facet syndrome  Lumbar stenosis  Sacroiliac  joint dysfunction  Greater occipital neuralgia  Degenerative disc  disease of the cervical spine  Cervical facet syndrome  Degenerative disc disease of the thoracic spine  Thoracic facet syndrome     PLAN   Continue present medication Lyrica, Flexeril, and hydrocodone acetaminophen  F/U PCP Dr. Lennox Grumbles or other PCP  for evaluation of  BP diabetes mellitus and general medical  condition. Blood pressure was low on today's visit. Please follow-up with PCP as discussed  F/U surgical evaluation. May consider pending follow-up evaluations  F/U vascular evaluation as discussed Patient is to follow-up with Dr.Braham for vascular reevaluation this week as discussed   F/U cardiac evaluationAs discussed  F/U neurological evaluation. May consider PNCV/EMG studies and other studies pending follow-up evaluations  May consider radiofrequency rhizolysis or intraspinal procedures pending response to present treatment and F/U evaluation   Patient to call Pain Management Center should patient have concerns prior to scheduled return appointment

## 2016-06-18 NOTE — Patient Instructions (Addendum)
PLAN   Continue present medication Lyrica, Flexeril, and hydrocodone acetaminophen  F/U PCP Dr. Lennox Grumbles or other PCP  for evaliation of  BP diabetes mellitus and general medical  condition. Blood pressure was low on today's visit. Please follow-up with PCP as discussed  F/U surgical evaluation. May consider pending follow-up evaluations  F/U vascular evaluation as discussed Patient is to follow-up with Dr.Braham for vascular reevaluation this week as discussed   F/U cardiac evaluationAs discussed  F/U neurological evaluation. May consider PNCV/EMG studies and other studies pending follow-up evaluations  May consider radiofrequency rhizolysis or intraspinal procedures pending response to present treatment and F/U evaluation   Patient to call Pain Management Center should patient have concerns prior to scheduled return appointmentPain Management Discharge Instructions  General Discharge Instructions :  If you need to reach your doctor call: Monday-Friday 8:00 am - 4:00 pm at [phone] or toll free [phone].  After clinic hours [phone] to have operator reach doctor.  Bring all of your medication bottles to all your appointments in the pain clinic.  To cancel or reschedule your appointment with Pain Management please remember to call 24 hours in advance to avoid a fee.  Refer to the educational materials which you have been given on: General Risks, I had my Procedure. Discharge Instructions, Post Sedation.  Post Procedure Instructions:  The drugs you were given will stay in your system until tomorrow, so for the next 24 hours you should not drive, make any legal decisions or drink any alcoholic beverages.  You may eat anything you prefer, but it is better to start with liquids then soups and crackers, and gradually work up to solid foods.  Please notify your doctor immediately if you have any unusual bleeding, trouble breathing or pain that is not related to your normal  pain.  Depending on the type of procedure that was done, some parts of your body may feel week and/or numb.  This usually clears up by tonight or the next day.  Walk with the use of an assistive device or accompanied by an adult for the 24 hours.  You may use ice on the affected area for the first 24 hours.  Put ice in a Ziploc bag and cover with a towel and place against area 15 minutes on 15 minutes off.  You may switch to heat after 24 hours.

## 2016-06-20 ENCOUNTER — Encounter: Payer: Self-pay | Admitting: Family

## 2016-06-25 ENCOUNTER — Ambulatory Visit (INDEPENDENT_AMBULATORY_CARE_PROVIDER_SITE_OTHER)
Admission: RE | Admit: 2016-06-25 | Discharge: 2016-06-25 | Disposition: A | Discharge status: Home / Self Care | Payer: Medicare Other | Source: Ambulatory Visit | Attending: Surgery | Admitting: Surgery

## 2016-06-25 ENCOUNTER — Encounter: Payer: Self-pay | Admitting: Family

## 2016-06-25 ENCOUNTER — Ambulatory Visit (HOSPITAL_COMMUNITY)
Admission: RE | Admit: 2016-06-25 | Discharge: 2016-06-25 | Disposition: A | Discharge status: Home / Self Care | Payer: Medicare Other | Source: Ambulatory Visit | Attending: Surgery | Admitting: Surgery

## 2016-06-25 ENCOUNTER — Ambulatory Visit (INDEPENDENT_AMBULATORY_CARE_PROVIDER_SITE_OTHER): Payer: Medicare Other | Admitting: Family

## 2016-06-25 VITALS — BP 102/74 | HR 99 | Temp 97.5°F | Resp 16 | Ht 67.0 in | Wt 153.0 lb

## 2016-06-25 DIAGNOSIS — I771 Stricture of artery: Secondary | ICD-10-CM | POA: Diagnosis not present

## 2016-06-25 DIAGNOSIS — Z959 Presence of cardiac and vascular implant and graft, unspecified: Secondary | ICD-10-CM

## 2016-06-25 DIAGNOSIS — E1151 Type 2 diabetes mellitus with diabetic peripheral angiopathy without gangrene: Secondary | ICD-10-CM | POA: Diagnosis not present

## 2016-06-25 DIAGNOSIS — I739 Peripheral vascular disease, unspecified: Secondary | ICD-10-CM

## 2016-06-25 DIAGNOSIS — Z95828 Presence of other vascular implants and grafts: Secondary | ICD-10-CM | POA: Insufficient documentation

## 2016-06-25 DIAGNOSIS — I779 Disorder of arteries and arterioles, unspecified: Secondary | ICD-10-CM

## 2016-06-25 DIAGNOSIS — Z95820 Peripheral vascular angioplasty status with implants and grafts: Secondary | ICD-10-CM | POA: Diagnosis not present

## 2016-06-25 DIAGNOSIS — Z87891 Personal history of nicotine dependence: Secondary | ICD-10-CM

## 2016-06-25 NOTE — Patient Instructions (Signed)
Peripheral Vascular Disease Peripheral vascular disease (PVD) is a disease of the blood vessels that are not part of your heart and brain. A simple term for PVD is poor circulation. In most cases, PVD narrows the blood vessels that carry blood from your heart to the rest of your body. This can result in a decreased supply of blood to your arms, legs, and internal organs, like your stomach or kidneys. However, it most often affects a person's lower legs and feet. There are two types of PVD.  Organic PVD. This is the more common type. It is caused by damage to the structure of blood vessels.  Functional PVD. This is caused by conditions that make blood vessels contract and tighten (spasm). Without treatment, PVD tends to get worse over time. PVD can also lead to acute ischemic limb. This is when an arm or limb suddenly has trouble getting enough blood. This is a medical emergency. CAUSES Each type of PVD has many different causes. The most common cause of PVD is buildup of a fatty material (plaque) inside of your arteries (atherosclerosis). Small amounts of plaque can break off from the walls of the blood vessels and become lodged in a smaller artery. This blocks blood flow and can cause acute ischemic limb. Other common causes of PVD include:  Blood clots that form inside of blood vessels.  Injuries to blood vessels.  Diseases that cause inflammation of blood vessels or cause blood vessel spasms.  Health behaviors and health history that increase your risk of developing PVD. RISK FACTORS  You may have a greater risk of PVD if you:  Have a family history of PVD.  Have certain medical conditions, including:  High cholesterol.  Diabetes.  High blood pressure (hypertension).  Coronary heart disease.  Past problems with blood clots.  Past injury, such as burns or a broken bone. These may have damaged blood vessels in your limbs.  Buerger disease. This is caused by inflamed blood  vessels in your hands and feet.  Some forms of arthritis.  Rare birth defects that affect the arteries in your legs.  Use tobacco.  Do not get enough exercise.  Are obese.  Are age 50 or older. SIGNS AND SYMPTOMS  PVD may cause many different symptoms. Your symptoms depend on what part of your body is not getting enough blood. Some common signs and symptoms include:  Cramps in your lower legs. This may be a symptom of poor leg circulation (claudication).  Pain and weakness in your legs while you are physically active that goes away when you rest (intermittent claudication).  Leg pain when at rest.  Leg numbness, tingling, or weakness.  Coldness in a leg or foot, especially when compared with the other leg.  Skin or hair changes. These can include:  Hair loss.  Shiny skin.  Pale or bluish skin.  Thick toenails.  Inability to get or maintain an erection (erectile dysfunction). People with PVD are more prone to developing ulcers and sores on their toes, feet, or legs. These may take longer than normal to heal. DIAGNOSIS Your health care provider may diagnose PVD from your signs and symptoms. The health care provider will also do a physical exam. You may have tests to find out what is causing your PVD and determine its severity. Tests may include:  Blood pressure recordings from your arms and legs and measurements of the strength of your pulses (pulse volume recordings).  Imaging studies using sound waves to take pictures of   the blood flow through your blood vessels (Doppler ultrasound).  Injecting a dye into your blood vessels before having imaging studies using:  X-rays (angiogram or arteriogram).  Computer-generated X-rays (CT angiogram).  A powerful electromagnetic field and a computer (magnetic resonance angiogram or MRA). TREATMENT Treatment for PVD depends on the cause of your condition and the severity of your symptoms. It also depends on your age. Underlying  causes need to be treated and controlled. These include long-lasting (chronic) conditions, such as diabetes, high cholesterol, and high blood pressure. You may need to first try making lifestyle changes and taking medicines. Surgery may be needed if these do not work. Lifestyle changes may include:  Quitting smoking.  Exercising regularly.  Following a low-fat, low-cholesterol diet. Medicines may include:  Blood thinners to prevent blood clots.  Medicines to improve blood flow.  Medicines to improve your blood cholesterol levels. Surgical procedures may include:  A procedure that uses an inflated balloon to open a blocked artery and improve blood flow (angioplasty).  A procedure to put in a tube (stent) to keep a blocked artery open (stent implant).  Surgery to reroute blood flow around a blocked artery (peripheral bypass surgery).  Surgery to remove dead tissue from an infected wound on the affected limb.  Amputation. This is surgical removal of the affected limb. This may be necessary in cases of acute ischemic limb that are not improved through medical or surgical treatments. HOME CARE INSTRUCTIONS  Take medicines only as directed by your health care provider.  Do not use any tobacco products, including cigarettes, chewing tobacco, or electronic cigarettes. If you need help quitting, ask your health care provider.  Lose weight if you are overweight, and maintain a healthy weight as directed by your health care provider.  Eat a diet that is low in fat and cholesterol. If you need help, ask your health care provider.  Exercise regularly. Ask your health care provider to suggest some good activities for you.  Use compression stockings or other mechanical devices as directed by your health care provider.  Take good care of your feet.  Wear comfortable shoes that fit well.  Check your feet often for any cuts or sores. SEEK MEDICAL CARE IF:  You have cramps in your legs  while walking.  You have leg pain when you are at rest.  You have coldness in a leg or foot.  Your skin changes.  You have erectile dysfunction.  You have cuts or sores on your feet that are not healing. SEEK IMMEDIATE MEDICAL CARE IF:  Your arm or leg turns cold and blue.  Your arms or legs become red, warm, swollen, painful, or numb.  You have chest pain or trouble breathing.  You suddenly have weakness in your face, arm, or leg.  You become very confused or lose the ability to speak.  You suddenly have a very bad headache or lose your vision.   This information is not intended to replace advice given to you by your health care provider. Make sure you discuss any questions you have with your health care provider.   Document Released: 12/12/2004 Document Revised: 11/25/2014 Document Reviewed: 04/14/2014 [employer] Interactive Patient Education 2016 [employer]  

## 2016-06-25 NOTE — Progress Notes (Signed)
[facility] [place]   CC: Follow up peripheral artery occlusive disease  History of Present Illness Makeila DONIS KOTOWSKI is a 50 y.o. female patient of Dr. Trula Slade who is s/p:  Procedure Performed on 05/07/2016:                       1.  Ultrasound-guided access, left femoral artery                       2.  Aortic arch angiogram                       3.  Second order catheterization (right subclavian artery) with right subclavian angiogram                       4.  Drug coated balloon and plasty, right subclavian artery                       5.  Abdominal aortogram with bifemoral runoff                       6.  Third order catheterization (right external iliac artery)                       7.  Drug coated balloon angioplasty, right external iliac artery                       8.  Stent, right external iliac artery                       9.  Conscious sedation (7:42-9:40)   Pt was to follow-up 3 months afterward with bilateral subclavian artery duplex and bilateral lower extremity duplex with ABIs, she can Dr. Trula Slade or Vinnie Level  Patient scheduled for an appt on 09/02/16 at 11am for vascular studies and to see VWB after.  Pt is also status post right femoral to below knee popliteal artery bypass graft with vein on 07/05/2010 for nonhealing ulcer, and left femoral to below knee popliteal artery bypass graft with vein on 06/27/2011 . She had a high-grade stenosis identified at the proximal anastomosis on the right and on 06/15/2014 she underwent drug coated balloon angioplasty with a 6 mm balloon.Follow-up ultrasound revealed a recurrence of the stenosis and she was taken back for angiography on 11/22/2014. She had a recurrent high-grade stenosis within the proximal bypass graft. A dissection was created after balloon angioplasty that did not respond to repeat angioplasty therefore a 6 x 30 self-expanding stent was deployed.  03/05/16 visit with VWB before the June 2017  arteriogram with interventions: Carotid duplex shows 40-59% bilateral carotid stenosis.  There is a focal right subclavian stenosis just beyond the vertebral artery origin.  The left subclavian artery appears occluded.  She returns today with c/o right medial groin to medial aspect right knee that started 2 1/2 weeks ago when she starts walking, pain is relieved after an hour of rest.   She attends a pain management clinic in [facility], Dr. Primus Bravo, for her back and neck issues. Dr. Primus Bravo note indicates and pt confirms that she is here for evaluation to determine if the right leg bypass is the cause of this pain or if she has another stenosis.  Pt states Dr. Primus Bravo is  holding off on giving her injections in her low back and c-spine until this is determined.   Pt Diabetic: Yes, states she does not know her last A1C, states in bad control Pt smoker: former smoker, quit in June 2017  Pt meds include: Statin :No Betablocker: No ASA: Yes Other anticoagulants/antiplatelets: no  Past Medical History:  Diagnosis Date  . Arthritis   . Asthma   . Chronic back pain   . Depression   . Diabetes mellitus   . Hyperlipidemia   . Joint pain   . Leg pain    With Walking  . PAD (peripheral artery disease) ([place])   . Peripheral arterial disease ([place])   . Reflux   . Ulcer    Foot    Social History Social History  Substance Use Topics  . Smoking status: Former Smoker    Packs/day: 0.20    Years: 30.00    Types: Cigarettes    Quit date: 04/29/2016  . Smokeless tobacco: Never Used     Comment: 2 cigarrettes a day  . Alcohol use No    Family History Family History  Problem Relation Age of Onset  . Coronary artery disease Mother   . Peripheral vascular disease Mother   . Heart disease Mother     Before age 57  . Other Mother     Venous insuffiency  . Diabetes Mother   . Hyperlipidemia Mother   . Hypertension Mother   . Varicose Veins Mother   . Heart attack Mother      before age 22  . Heart disease Father   . Diabetes Father   . Diabetes Maternal Grandmother   . Diabetes Paternal Grandmother   . Diabetes Paternal Grandfather   . Diabetes Sister   . Hypertension Sister   . Diabetes Brother   . Hypertension Brother     Past Surgical History:  Procedure Laterality Date  . ABDOMINAL AORTAGRAM  June 15, 2014  . ABDOMINAL AORTAGRAM N/A 06/15/2014   Procedure: ABDOMINAL Maxcine Ham;  Surgeon: Serafina Mitchell, MD;  Location: [facility] CATH LAB;  Service: Cardiovascular;  Laterality: N/A;  . ABDOMINAL AORTAGRAM N/A 11/22/2014   Procedure: ABDOMINAL AORTAGRAM;  Surgeon: Serafina Mitchell, MD;  Location: [facility] CATH LAB;  Service: Cardiovascular;  Laterality: N/A;  . ARTERIAL BYPASS SURGRY  07/05/2010   Right Common Femoral to below knee popliteal BPG  . BACK SURGERY     X's  2  . CARDIAC CATHETERIZATION    . CHOLECYSTECTOMY     Gall Bladder  . CYSTECTOMY Right    foot  . CYSTECTOMY Left    wrist  . INTERCOSTAL NERVE BLOCK  November 2015  . left foot surgery    . left wrist cyst removal Left   . PERIPHERAL VASCULAR CATHETERIZATION N/A 05/07/2016   Procedure: Abdominal Aortogram;  Surgeon: Serafina Mitchell, MD;  Location: [place] CV LAB;  Service: Cardiovascular;  Laterality: N/A;  . PERIPHERAL VASCULAR CATHETERIZATION N/A 05/07/2016   Procedure: Lower Extremity Angiography;  Surgeon: Serafina Mitchell, MD;  Location: [place] CV LAB;  Service: Cardiovascular;  Laterality: N/A;  . PERIPHERAL VASCULAR CATHETERIZATION N/A 05/07/2016   Procedure: Aortic Arch Angiography;  Surgeon: Serafina Mitchell, MD;  Location: [place] CV LAB;  Service: Cardiovascular;  Laterality: N/A;  . PERIPHERAL VASCULAR CATHETERIZATION N/A 05/07/2016   Procedure: Upper Extremity Angiography;  Surgeon: Serafina Mitchell, MD;  Location: [place] CV LAB;  Service: Cardiovascular;  Laterality: N/A;  . PERIPHERAL  VASCULAR CATHETERIZATION Right 05/07/2016   Procedure: Peripheral Vascular Balloon  Angioplasty;  Surgeon: Serafina Mitchell, MD;  Location: [place] CV LAB;  Service: Cardiovascular;  Laterality: Right;  subclavian  . PERIPHERAL VASCULAR CATHETERIZATION Right 05/07/2016   Procedure: Peripheral Vascular Intervention;  Surgeon: Serafina Mitchell, MD;  Location: [employer] CV LAB;  Service: Cardiovascular;  Laterality: Right;  External  Iliac  . SKIN GRAFT Right 2012   [name] by Dr. Nils Pyle- Right and Left Ankle  . SPINE SURGERY    . TONSILLECTOMY      Allergies  Allergen Reactions  . Penicillins Other (See Comments)    Severe Headache    Current Outpatient Prescriptions  Medication Sig Dispense Refill  . albuterol (PROVENTIL HFA;VENTOLIN HFA) 108 (90 Base) MCG/ACT inhaler Inhale 2 puffs into the lungs as needed for wheezing or shortness of breath. Reported on 02/28/2016    . albuterol (PROVENTIL) 2 MG tablet Take 2 mg by mouth 3 (three) times daily as needed.     Marland Kitchen aspirin 81 MG tablet Take 81 mg by mouth daily.    . Blood Glucose Monitoring Suppl (ACCU-CHEK AVIVA PLUS) w/Device KIT     . cetirizine (ZYRTEC) 10 MG tablet Take 10 mg by mouth daily. Reported on 03/27/2016    . Cholecalciferol (VITAMIN D3) 50000 units CAPS Take 50,000 Units by mouth once a week. Reported on 04/08/2016    . Choline Fenofibrate (FENOFIBRIC ACID) 45 MG CPDR Take 1 tablet by mouth daily. Reported on 03/18/2016    . furosemide (LASIX) 40 MG tablet Take 40 mg by mouth 2 (two) times daily as needed for fluid.     Marland Kitchen HUMALOG KWIKPEN 100 UNIT/ML KiwkPen Inject 10 Units into the skin 3 (three) times daily. Reported on 04/08/2016    . HYDROcodone-acetaminophen (NORCO) 10-325 MG tablet Limit 1 tablet by mouth per day or twice per day if tolerated 60 tablet 0  . LANTUS SOLOSTAR 100 UNIT/ML Solostar Pen Inject 60 Units into the skin at bedtime.     Marland Kitchen loperamide (IMODIUM A-D) 2 MG tablet Take 2 mg by mouth as needed for diarrhea or loose stools.    . meclizine (ANTIVERT) 25 MG tablet Take 25 mg by mouth 3 (three)  times daily as needed for dizziness.    . naproxen sodium (ANAPROX) 220 MG tablet Take 220 mg by mouth 2 (two) times daily as needed (pain). Reported on 03/18/2016    . nicotine (NICOTROL) 10 MG inhaler Inhale 1 continuous puffing into the lungs as needed for smoking cessation.    Marland Kitchen omeprazole (PRILOSEC) 20 MG capsule Take 20 mg by mouth daily.    . pregabalin (LYRICA) 100 MG capsule Limit 1 tab by mouth twice a day to 3 times a day if tolerated 90 capsule 0  . pseudoephedrine-guaifenesin (MUCINEX D) 60-600 MG per tablet Take 1 tablet by mouth every 12 (twelve) hours as needed for congestion. Reported on 04/08/2016    . simethicone (MYLICON) 263 MG chewable tablet Chew 125 mg by mouth every 6 (six) hours as needed for flatulence.    . cyclobenzaprine (FLEXERIL) 10 MG tablet Limit 1 tablet by mouth 1  to  3 times per day if tolerated (Patient not taking: Reported on 06/25/2016) 90 tablet 0  . doxycycline (VIBRA-TABS) 100 MG tablet Take 100 mg by mouth See admin instructions. Takes for 10 days when receiving injections in back/neck - every 2 months     Current Facility-Administered Medications  Medication Dose Route Frequency  Provider Last Rate Last Dose  . bupivacaine (PF) (MARCAINE) 0.25 % injection 30 mL  30 mL Other Once Mohammed Kindle, MD      . ciprofloxacin (CIPRO) IVPB 400 mg  400 mg Intravenous Once Mohammed Kindle, MD      . ciprofloxacin (CIPRO) tablet 250 mg  250 mg Oral BID Mohammed Kindle, MD   250 mg at 04/08/16 1221  . fentaNYL (SUBLIMAZE) injection 100 mcg  100 mcg Intravenous Once Mohammed Kindle, MD      . fentaNYL (SUBLIMAZE) injection 100 mcg  100 mcg Intravenous Once Mohammed Kindle, MD      . lactated ringers infusion 1,000 mL  1,000 mL Intravenous Continuous Mohammed Kindle, MD      . lactated ringers infusion 1,000 mL  1,000 mL Intravenous Continuous Mohammed Kindle, MD 125 mL/hr at 04/08/16 1220 1,000 mL at 04/08/16 1220  . midazolam (VERSED) 5 MG/5ML injection 5 mg  5 mg Intravenous Once  Mohammed Kindle, MD      . midazolam (VERSED) 5 MG/5ML injection 5 mg  5 mg Intravenous Once Mohammed Kindle, MD      . orphenadrine (NORFLEX) injection 60 mg  60 mg Intramuscular Once Mohammed Kindle, MD      . triamcinolone acetonide (KENALOG-40) injection 40 mg  40 mg Other Once Mohammed Kindle, MD       Facility-Administered Medications Ordered in Other Visits  Medication Dose Route Frequency Provider Last Rate Last Dose  . 0.9 %  sodium chloride infusion   Intravenous Continuous Serafina Mitchell, MD        ROS: See HPI for pertinent positives and negatives.   Physical Examination  Vitals:   06/25/16 0858  BP: 102/74  Pulse: 99  Resp: 16  Temp: 97.5 F (36.4 C)  Weight: 153 lb (69.4 kg)  Height: '5\' 7"'  (1.702 m)   Body mass index is 23.96 kg/m.  General: A&O x 3, WDWN. Gait: normal Eyes: PERRLA. Pulmonary: Respirations are non labored, CTAB, with limited air movement, without wheezes, rales, or rhonchi. Cardiac: regular rhythm, no detected murmur.     Carotid Bruits Left Right   positive positive  Aorta is not palpable. Radial pulses: right is 2+ palpable,left is not palpable. Capillary refill in fingers of both hands is adequate, fingers are pink   VASCULAR EXAM: Extremities without ischemic changes  without Gangrene; without open wounds.     LE Pulses LEFT RIGHT   AORTA Not palpable N/A   FEMORAL 2+ palpable 2+ palpable    POPLITEAL not palpable  not palpable   POSTERIOR TIBIAL not palpable  not palpable    DORSALIS PEDIS  ANTERIOR TIBIAL 2+ palpable  not palpable    Abdomen: soft, NT, no masses palpated. Skin: no rashes, no ulcers. Musculoskeletal: no muscle wasting or atrophy.Bialteral  Dupetryn's contractures.  Neurologic: A&O X 3; Appropriate Affect ; SENSATION: normal; MOTOR FUNCTION: moving all extremities equally, motor strength 3/5 throughout. Speech is fluent/normal. CN 2-12 intact.    Non-Invasive Vascular Imaging: DATE: 06/25/2016  LOWER EXTREMITY ARTERIAL DUPLEX EVALUATION    INDICATION: Follow-up bilateral lower extremity bypass graft     PREVIOUS INTERVENTION(S): Right femoropopliteal arterial bypass graft placed 07/05/2010 Right external iliac artery, common femoral artery, and proximal graft angioplasty 06/05/2014 Right proximal graft stent placed 11/22/2014 Left femoropopliteal arterial bypass graft placed 06/27/2011    DUPLEX EXAM:     RIGHT  LEFT   Peak Systolic Velocity (cm/s) Ratio (if abnormal) Waveform  Peak Systolic Velocity (cm/s) Ratio (  if abnormal) Waveform  95  M Inflow Artery 108  Stenotic  Stent  M Proximal Anastomosis 118  B  26  M Proximal Graft 65  B  19  M Mid Graft 47  B  23  M  Distal Graft 64  B  23  M Distal Anastomosis 69  B  11  M Outflow Artery 84  B  0.56 Today's ABI / TBI 0.87  NA Previous ABI / TBI (  ) NA    Waveform:    M - Monophasic       B - Biphasic       T - Triphasic  If Ankle Brachial Index (ABI) or Toe Brachial Index (TBI) performed, please see complete report     ADDITIONAL FINDINGS: See attached diagram for stent velocities. Ankle brachial index is contraindicated in patients with bilateral subclavian artery disease.   Impression on following page.     IMPRESSION: 1. Widely patent right femoropopliteal arterial bypass graft and stent without evidence of restenosis or hyperplasia.  2. Right profunda femoral demonstrates PSV ratio of 7.7 with post stenotic turbulence noted suggestive of a >50% diameter reduction. 3. Widely patent left femoropopliteal arterial bypass graft without evidence of restenosis or hyperplasia.  4. Known bilateral native artery inflow disease, right worse than left, with monophasic  waveforms noted in the right common femoral artery and stenosis in the left distal external iliac artery.    Compared to the previous exam:  Patient has undergone intervention since previous exam.     ASSESSMENT: SCOUT GUYETT is a 50 y.o. female who presents with c/o 2 1/2 week hx of right groin to below knee pain at the medial aspect of her leg when she walks, resolves after an hour of rest. She has had multiple vascular surgeries and procedures.   Today's bilateral LE arterial duplex suggests a widely patent right femoropopliteal arterial bypass graft and stent without evidence of restenosis or hyperplasia.  Right profunda femoral demonstrates PSV ratio of 7.7 with post stenotic turbulence noted suggestive of a >50% diameter reduction. Widely patent left femoropopliteal arterial bypass graft without evidence of restenosis or hyperplasia.  Known bilateral native artery inflow disease, right worse than left, with monophasic waveforms noted in the right common femoral artery and stenosis in the left distal external iliac artery.  I discussed with Dr. Donnetta Hutching pt's HPI, physical exam results, and results of today's duplex. The pain in her right leg is not from a new or worsening vascular condition. She may proceed with pain management measures.   Fortunately she seems to have remained tobacco free since June of 2017. Unfortunately her DM seems to remain uncontrolled.   PLAN:  Based on the patient's vascular studies and examination, and after discussing with Dr. Donnetta Hutching, pt will return to clinic as already scheduled on 09/02/16 with bilateral subclavian artery duplex and bilateral lower extremity duplex with ABIs, see Dr. Trula Slade afterward.  I discussed in depth with the patient the nature of atherosclerosis, and emphasized the importance of maximal medical management including strict control of blood pressure, blood glucose, and lipid levels, obtaining regular exercise, and cessation of  smoking.  The patient is aware that without maximal medical management the underlying atherosclerotic disease process will progress, limiting the benefit of any interventions.  The patient was given information about PAD including signs, symptoms, treatment, what symptoms should prompt the patient to seek immediate medical care, and risk reduction measures to take.  Clemon Chambers, RN, MSN,  FNP-C [facility] Phone: [phone]  Clinic MD: Early  06/25/16 9:03 AM

## 2016-06-26 ENCOUNTER — Encounter (HOSPITAL_COMMUNITY): Payer: Medicare Other

## 2016-06-26 ENCOUNTER — Ambulatory Visit: Payer: Medicare Other | Admitting: Family

## 2016-07-18 ENCOUNTER — Encounter: Payer: Self-pay | Admitting: Pain Medicine

## 2016-07-18 ENCOUNTER — Ambulatory Visit: Payer: Medicare Other | Attending: Pain Medicine | Admitting: Pain Medicine

## 2016-07-18 VITALS — BP 82/50 | HR 101 | Temp 97.6°F | Resp 18 | Ht 67.0 in | Wt 151.0 lb

## 2016-07-18 DIAGNOSIS — M533 Sacrococcygeal disorders, not elsewhere classified: Secondary | ICD-10-CM | POA: Insufficient documentation

## 2016-07-18 DIAGNOSIS — I739 Peripheral vascular disease, unspecified: Secondary | ICD-10-CM | POA: Diagnosis not present

## 2016-07-18 DIAGNOSIS — M4806 Spinal stenosis, lumbar region: Secondary | ICD-10-CM | POA: Insufficient documentation

## 2016-07-18 DIAGNOSIS — M542 Cervicalgia: Secondary | ICD-10-CM | POA: Diagnosis present

## 2016-07-18 DIAGNOSIS — R51 Headache: Secondary | ICD-10-CM | POA: Diagnosis present

## 2016-07-18 DIAGNOSIS — M5481 Occipital neuralgia: Secondary | ICD-10-CM | POA: Diagnosis not present

## 2016-07-18 DIAGNOSIS — E1140 Type 2 diabetes mellitus with diabetic neuropathy, unspecified: Secondary | ICD-10-CM | POA: Insufficient documentation

## 2016-07-18 DIAGNOSIS — M5137 Other intervertebral disc degeneration, lumbosacral region: Secondary | ICD-10-CM

## 2016-07-18 DIAGNOSIS — M5134 Other intervertebral disc degeneration, thoracic region: Secondary | ICD-10-CM | POA: Insufficient documentation

## 2016-07-18 DIAGNOSIS — M5136 Other intervertebral disc degeneration, lumbar region: Secondary | ICD-10-CM | POA: Diagnosis not present

## 2016-07-18 DIAGNOSIS — M545 Low back pain: Secondary | ICD-10-CM | POA: Diagnosis present

## 2016-07-18 DIAGNOSIS — M5415 Radiculopathy, thoracolumbar region: Secondary | ICD-10-CM

## 2016-07-18 DIAGNOSIS — M5030 Other cervical disc degeneration, unspecified cervical region: Secondary | ICD-10-CM | POA: Diagnosis not present

## 2016-07-18 DIAGNOSIS — M62830 Muscle spasm of back: Secondary | ICD-10-CM | POA: Insufficient documentation

## 2016-07-18 MED ORDER — CYCLOBENZAPRINE HCL 10 MG PO TABS: ORAL_TABLET | ORAL | 0 refills | Status: DC

## 2016-07-18 MED ORDER — HYDROCODONE-ACETAMINOPHEN 10-325 MG PO TABS: ORAL_TABLET | ORAL | 0 refills | Status: DC

## 2016-07-18 MED ORDER — PREGABALIN 100 MG PO CAPS: ORAL_CAPSULE | ORAL | 0 refills | Status: DC

## 2016-07-18 NOTE — Progress Notes (Signed)
The patient is a 50 year old female who returns to pain management for further evaluation and treatment of pain involving the neck headaches upper mid lower back and lower extremity regions. The patient states that she has had some increased pain involving the lower back and lower extremity regions. There has been concern regarding vascular etiology to patient's symptoms. The patient underwent evaluation with Dr.Braham who informed patient that he felt that her pain was due to abnormalities of the lumbar spine. The patient is scheduled to undergo lumbar MRI at this time. We discussed patient's condition and will continue present treatment regimen. We will await results of patient's lumbar MRI to consider interventional treatment as well as additional modifications of patient treatment regimen. The patient states the pain is aggravated by standing walking coming more intense as patient spends more time on the feet. The patient denies any trauma change in events of daily living the call significant change in symptomatology. We will also schedule patient for neurosurgical evaluation for further assessment of her condition and remain available to proceed with interventional treatment as well as additional modifications of treatment regimen as discussed and as explained to patient on today's visit. All agreed to suggested treatment plan. The patient will continue Lyrica Flexeril and hydrocodone acetaminophen at this time.    Physical examination   There was tenderness to palpation of the paraspinal must reason cervical region cervical facet region palpation which be produced mild to moderate discomfort with mild to moderate tenderness of the splenius capitis and occipitalis regions. There was mild to moderate tenderness of the cervical facet cervical paraspinal musculature region. There was tenderness over the acromioclavicular and glenohumeral joint regions a moderate degree. The patient was with  unremarkable drop test. Spurling's maneuver was unremarkable. There was tenderness of the thoracic region was evidence of moderate muscle spasms involving the lower thoracic paraspinal musculature region without crepitus of the thoracic region noted. Palpation over the lumbar paraspinal muscular region lumbar facet region was with moderate to moderately severe tenderness to palpation with lateral bending rotation extension and palpation over the lumbar facets reproducing moderately severe discomfort. Straight leg raise was tolerates approximately 20 without a definite increase of pain with dorsiflexion noted. There was tends to palpation of the knees noted. EHL strength appeared to be decreased. There was tenderness of the PSIS and PII S region a moderate degree. Palpation of the greater trochanteric region iliotibial band region reproduced moderate discomfort as well. There was no definite sensory deficit or dermatomal dystrophy detected. There was negative clonus negative Homans. Abdomen nontender with no costovertebral tenderness noted.        Assessment   Peripheral vascular disease  Diabetes mellitus with diabetic neuropathy/vasculopathy  Degenerative disc disease lumbar spine  Lumbar facet syndrome  Lumbar stenosis  Sacroiliac joint dysfunction  Greater occipital neuralgia  Degenerative disc disease of the cervical spine  Cervical facet syndrome  Degenerative disc disease of the thoracic spine      PLAN   Continue present medication Lyrica, Flexeril, and hydrocodone acetaminophen  Lumbar MRI as scheduled. We will consider interventional treatment pending results of lumbar MRI  F/U PCP Dr. Lennox Grumbles or other PCP  for evaliation of  BP diabetes mellitus and general medical  Condition  F/U surgical evaluation. Neurosurgical reevaluation to be scheduled at this time for further assessment of lumbar and lower extremity pain  Ask the nurses and secretary the date of your  neurosurgical evaluation of lower back and lower extremity pain  F/U vascular evaluation as discussed Patient is status post vascular evaluation by Dr.Braham.Dr Chaya Jan suggested patient have lumbar MRI since he felt that patient's symptoms were due to the intraspinal abnormalities of the lumbar region  F/U cardiac evaluationAs discussed  F/U neurological evaluation. May consider PNCV/EMG studies and other studies pending follow-up evaluations  May consider radiofrequency rhizolysis or intraspinal procedures pending response to present treatment and F/U evaluation   Patient to call Pain Management Center should patient have concerns prior to scheduled return appointment Thoracic facet syndrome

## 2016-07-18 NOTE — Progress Notes (Signed)
Safety precautions to be maintained throughout the outpatient stay will include: orient to surroundings, keep bed in low position, maintain call bell within reach at all times, provide assistance with transfer out of bed and ambulation.  

## 2016-07-18 NOTE — Patient Instructions (Addendum)
PLAN   Continue present medication Lyrica, Flexeril, and hydrocodone acetaminophen  Lumbar MRI as scheduled. We will consider interventional treatment pending results of lumbar MRI  F/U PCP Dr. Lennox Grumbles or other PCP  for evaliation of  BP diabetes mellitus and general medical  Condition  F/U surgical evaluation. Neurosurgical reevaluation to be scheduled at this time for further assessment of lumbar and lower extremity pain  Ask the nurses and secretary the date of your neurosurgical evaluation of lower back and lower extremity pain  F/U vascular evaluation as discussed Patient is status post vascular evaluation by Dr.Braham.Dr Chaya Jan suggested patient have lumbar MRI since he fell that patient's symptoms were due to the intraspinal abnormalities of the lumbar region  F/U cardiac evaluationAs discussed  F/U neurological evaluation. May consider PNCV/EMG studies and other studies pending follow-up evaluations  May consider radiofrequency rhizolysis or intraspinal procedures pending response to present treatment and F/U evaluation   Patient to call Pain Management Center should patient have concerns prior to scheduled return appointment

## 2016-08-29 ENCOUNTER — Encounter: Payer: Self-pay | Admitting: Surgery

## 2016-09-02 ENCOUNTER — Ambulatory Visit (INDEPENDENT_AMBULATORY_CARE_PROVIDER_SITE_OTHER): Payer: Medicare Other | Admitting: Surgery

## 2016-09-02 ENCOUNTER — Encounter (HOSPITAL_COMMUNITY): Payer: Medicare Other

## 2016-09-02 ENCOUNTER — Ambulatory Visit (HOSPITAL_COMMUNITY)
Admission: RE | Admit: 2016-09-02 | Discharge: 2016-09-02 | Disposition: A | Discharge status: Home / Self Care | Payer: Medicare Other | Source: Ambulatory Visit | Attending: Surgery | Admitting: Surgery

## 2016-09-02 ENCOUNTER — Encounter: Payer: Self-pay | Admitting: Surgery

## 2016-09-02 VITALS — BP 102/70 | HR 91 | Temp 98.6°F | Resp 16 | Ht 67.0 in | Wt 153.9 lb

## 2016-09-02 DIAGNOSIS — I6523 Occlusion and stenosis of bilateral carotid arteries: Secondary | ICD-10-CM | POA: Diagnosis present

## 2016-09-02 DIAGNOSIS — I779 Disorder of arteries and arterioles, unspecified: Secondary | ICD-10-CM

## 2016-09-02 DIAGNOSIS — I771 Stricture of artery: Secondary | ICD-10-CM

## 2016-09-02 DIAGNOSIS — I739 Peripheral vascular disease, unspecified: Secondary | ICD-10-CM

## 2016-09-02 DIAGNOSIS — I772 Rupture of artery: Secondary | ICD-10-CM

## 2016-09-02 LAB — VAS US CAROTID
LCCADDIAS: -43 cm/s
LCCADSYS: -129 cm/s
LEFT ECA DIAS: -38 cm/s
LEFT VERTEBRAL DIAS: 9 cm/s
LICADDIAS: -37 cm/s
LICADSYS: -86 cm/s
LICAPDIAS: -50 cm/s
LICAPSYS: -130 cm/s
Left CCA prox dias: 25 cm/s
Left CCA prox sys: 99 cm/s
RIGHT CCA MID DIAS: 23 cm/s
RIGHT ECA DIAS: -13 cm/s
RIGHT VERTEBRAL DIAS: -25 cm/s
Right CCA prox dias: 16 cm/s
Right CCA prox sys: 140 cm/s
Right cca dist sys: -132 cm/s

## 2016-09-02 NOTE — Progress Notes (Signed)
[facility] [place]  Patient name: [name] MRN: [id number] DOB: 18-Aug-1966 Sex: female  REASON FOR VISIT: follow-up  HPI: [name] is a 50 y.o. female who presents for follow-up. She has a history of bilateral femoral popliteal bypasses. She has had multiple interventions for recurrent stenosis. She most recently underwent angioplasty and stenting of her right external iliac artery on 05/07/2016. She also had new bilateral upper extremity numbness and she underwent balloon angioplasty of the right subclavian artery at that time.  Today, she continues to complain of bilateral hand numbness. She feels that the intervention did not help with her symptoms. She attributes her numbness to neuropathy. She was last seen in our office on 06/25/2016. At that time, she complained of right groin pain. She was afraid that her right leg stent had moved. Today, her right groin pain has improved. She complains mostly of lower back pain that radiates down her legs. She has a long history of chronic back pain. She is mostly sedentary because walking is painful secondary to back pain. She has developed a sacral pressure sore from being sedentary. Been using a barrier cream to this area and states that it has significantly improved. She denies any nonhealing wounds, claudication or rest pain. She also complains of dizziness.  She sees Dr. Primus Bravo for her back pain. He states that she no longer wants to undergo back surgery. He states that she is no longer smoking. She takes a daily aspirin and is on fenofibric acid for hyperlipidemia.  Past Medical History:  Diagnosis Date  . Arthritis   . Asthma   . Chronic back pain   . Depression   . Diabetes mellitus   . Hyperlipidemia   . Joint pain   . Leg pain    With Walking  . PAD (peripheral artery disease) ([place])   . Peripheral arterial disease ([place])   . Reflux   . Ulcer ([place])    Foot    Family History  Problem Relation Age  of Onset  . Coronary artery disease Mother   . Peripheral vascular disease Mother   . Heart disease Mother     Before age 71  . Other Mother     Venous insuffiency  . Diabetes Mother   . Hyperlipidemia Mother   . Hypertension Mother   . Varicose Veins Mother   . Heart attack Mother     before age 106  . Heart disease Father   . Diabetes Father   . Diabetes Maternal Grandmother   . Diabetes Paternal Grandmother   . Diabetes Paternal Grandfather   . Diabetes Sister   . Hypertension Sister   . Diabetes Brother   . Hypertension Brother     SOCIAL HISTORY: Social History  Substance Use Topics  . Smoking status: Former Smoker    Packs/day: 0.20    Years: 30.00    Types: Cigarettes    Quit date: 04/29/2016  . Smokeless tobacco: Never Used     Comment: 2 cigarrettes a day  . Alcohol use No    Allergies  Allergen Reactions  . Penicillins Other (See Comments)    Severe Headache    Current Outpatient Prescriptions  Medication Sig Dispense Refill  . albuterol (PROVENTIL HFA;VENTOLIN HFA) 108 (90 Base) MCG/ACT inhaler Inhale 2 puffs into the lungs as needed for wheezing or shortness of breath. Reported on 02/28/2016    . albuterol (PROVENTIL) 2 MG tablet Take 2 mg by mouth 3 (  three) times daily as needed.     Marland Kitchen aspirin 81 MG tablet Take 81 mg by mouth daily.    . Blood Glucose Monitoring Suppl (ACCU-CHEK AVIVA PLUS) w/Device KIT     . cetirizine (ZYRTEC) 10 MG tablet Take 10 mg by mouth daily. Reported on 03/27/2016    . Cholecalciferol (VITAMIN D3) 50000 units CAPS Take 50,000 Units by mouth once a week. Reported on 04/08/2016    . Choline Fenofibrate (FENOFIBRIC ACID) 45 MG CPDR Take 1 tablet by mouth daily. Reported on 03/18/2016    . cyclobenzaprine (FLEXERIL) 10 MG tablet Limit 1 tablet by mouth 1  to  3 times per day if tolerated 90 tablet 0  . doxycycline (VIBRA-TABS) 100 MG tablet Take 100 mg by mouth See admin instructions. Takes for 10 days when receiving injections in  back/neck - every 2 months    . furosemide (LASIX) 40 MG tablet Take 40 mg by mouth 2 (two) times daily as needed for fluid.     Marland Kitchen HUMALOG KWIKPEN 100 UNIT/ML KiwkPen Inject 10 Units into the skin 3 (three) times daily. Reported on 04/08/2016    . HYDROcodone-acetaminophen (NORCO) 10-325 MG tablet Limit 1 tablet by mouth per day or twice per day if tolerated 60 tablet 0  . LANTUS SOLOSTAR 100 UNIT/ML Solostar Pen Inject 60 Units into the skin at bedtime.     Marland Kitchen loperamide (IMODIUM A-D) 2 MG tablet Take 2 mg by mouth as needed for diarrhea or loose stools.    . meclizine (ANTIVERT) 25 MG tablet Take 25 mg by mouth 3 (three) times daily as needed for dizziness.    . naproxen sodium (ANAPROX) 220 MG tablet Take 220 mg by mouth 2 (two) times daily as needed (pain). Reported on 03/18/2016    . nicotine (NICOTROL) 10 MG inhaler Inhale 1 continuous puffing into the lungs as needed for smoking cessation.    Marland Kitchen omeprazole (PRILOSEC) 20 MG capsule Take 20 mg by mouth daily.    . pregabalin (LYRICA) 100 MG capsule Limit 1 tab by mouth twice a day to 3 times a day if tolerated 90 capsule 0  . pregabalin (LYRICA) 100 MG capsule Limit 1 tab by mouth twice a day to 3 times a day if tolerated 90 capsule 0  . pseudoephedrine-guaifenesin (MUCINEX D) 60-600 MG per tablet Take 1 tablet by mouth every 12 (twelve) hours as needed for congestion. Reported on 04/08/2016    . simethicone (MYLICON) 631 MG chewable tablet Chew 125 mg by mouth every 6 (six) hours as needed for flatulence.     Current Facility-Administered Medications  Medication Dose Route Frequency Provider Last Rate Last Dose  . bupivacaine (PF) (MARCAINE) 0.25 % injection 30 mL  30 mL Other Once Mohammed Kindle, MD      . ciprofloxacin (CIPRO) IVPB 400 mg  400 mg Intravenous Once Mohammed Kindle, MD      . ciprofloxacin (CIPRO) tablet 250 mg  250 mg Oral BID Mohammed Kindle, MD   250 mg at 04/08/16 1221  . fentaNYL (SUBLIMAZE) injection 100 mcg  100 mcg Intravenous  Once Mohammed Kindle, MD      . fentaNYL (SUBLIMAZE) injection 100 mcg  100 mcg Intravenous Once Mohammed Kindle, MD      . lactated ringers infusion 1,000 mL  1,000 mL Intravenous Continuous Mohammed Kindle, MD      . lactated ringers infusion 1,000 mL  1,000 mL Intravenous Continuous Mohammed Kindle, MD 125 mL/hr at 04/08/16 1220 1,000  mL at 04/08/16 1220  . midazolam (VERSED) 5 MG/5ML injection 5 mg  5 mg Intravenous Once Mohammed Kindle, MD      . midazolam (VERSED) 5 MG/5ML injection 5 mg  5 mg Intravenous Once Mohammed Kindle, MD      . orphenadrine (NORFLEX) injection 60 mg  60 mg Intramuscular Once Mohammed Kindle, MD      . triamcinolone acetonide (KENALOG-40) injection 40 mg  40 mg Other Once Mohammed Kindle, MD       Facility-Administered Medications Ordered in Other Visits  Medication Dose Route Frequency Provider Last Rate Last Dose  . 0.9 %  sodium chloride infusion   Intravenous Continuous Serafina Mitchell, MD        REVIEW OF SYSTEMS:  _0  denotes positive finding, _1  denotes negative finding Cardiac  Comments:  Chest pain or chest pressure:    Shortness of breath upon exertion:    Short of breath when lying flat:    Irregular heart rhythm:        Vascular    Pain in calf, thigh, or hip brought on by ambulation:    Pain in feet at night that wakes you up from your sleep:     Blood clot in your veins:    Leg swelling:  x       Pulmonary    Oxygen at home:    Productive cough:     Wheezing:         Neurologic    Sudden weakness in arms or legs:     Sudden numbness in arms or legs:     Sudden onset of difficulty speaking or slurred speech:    Temporary loss of vision in one eye:     Problems with dizziness:  x       Gastrointestinal    Blood in stool:     Vomited blood:         Genitourinary    Burning when urinating:     Blood in urine:        Psychiatric    Major depression:         Hematologic    Bleeding problems:    Problems with blood clotting too easily:         Skin    Rashes or ulcers:        Constitutional    Fever or chills:      PHYSICAL EXAM: Vitals:   09/02/16 1420  BP: 102/70  Pulse: 91  Resp: 16  Temp: 98.6 F (37 C)  TempSrc: Oral  SpO2: 93%  Weight: 153 lb 14.4 oz (69.8 kg)  Height: _2  (1.702 m)    GENERAL: The patient is a well-nourished female, in no acute distress. The vital signs are documented above. CARDIAC: There is a regular rate and rhythm. Right carotid bruit.  VASCULAR: 2+ right radial pulse. Nonpalpable left radial pulse. 2+ right femoral pulse. Nonpalpable left femoral pulse. Nonpalpable pedal pulses.  PULMONARY: There is good air exchange bilaterally without wheezing or rales. MUSCULOSKELETAL: There are no major deformities or cyanosis. NEUROLOGIC: No focal weakness or paresthesias are detected. SKIN: Superficial wound to right lateral malleolus. PSYCHIATRIC: The patient has a normal affect.  DATA:  Carotid duplex 09/02/2016  40-59% bilateral internal carotid artery stenosis. Left subclavian artery is monophasic and left vertebral artery with retrograde flow. Right subclavian artery with biphasic waveforms. Right vertebral with antegrade flow.   Lower extremity arterial duplex and ABIs 06/25/2016  Right: 0.56.  Monophasic waveforms throughout right lower extremity. Right bypass graft is patent. Left: 0.87. Biphasic waveforms throughout. Left past graft is patent.  MEDICAL ISSUES:  Peripheral arterial disease  The patient is status post bilateral femoral-popliteal bypass grafts. She is undergone multiple interventions for recurrent stenosis. Most recently, she underwent right external iliac stenting on 05/07/2016. On her most recent duplex in August 2017, both bypass grafts are patent. Her ambulation is mostly limited by back pain. She didn't denies any claudication, rest pain or nonhealing wounds. Advised her to follow up with Dr. Primus Bravo regarding her back pain. No further intervention warranted at  this time. She'll follow-up in 3 months with an aorto iliac duplex, bilateral graft duplexes and ABIs. She is on maximal medical management with aspirin and a lipid lowering agent. She is not smoking.  Bilateral upper extremity numbness  The patient previously underwent balloon angioplasty of the right subclavian artery. She states that this has not helped to improve her symptoms. On her carotid duplex today, the left subclavian artery has monophasic waveforms in the left vertebral artery has retrograde flow. Do not believe that there is any benefit to pursuing intervention to the left subclavian artery given minimal results with the right subclavian. 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 She'll follow up in 6 months with a carotid/subclavian duplex.   Virgina Jock, PA-C [facility]    I agree with the above.  I have seen and evaluated the patient.  She is scheduled to follow up in 6 months with repeat vascular lab studies.  [name]

## 2016-09-18 ENCOUNTER — Other Ambulatory Visit: Payer: Self-pay | Admitting: Pain Medicine

## 2016-09-30 ENCOUNTER — Emergency Department (HOSPITAL_COMMUNITY): Payer: Medicare Other | Admitting: Certified Registered Nurse Anesthetist

## 2016-09-30 ENCOUNTER — Inpatient Hospital Stay (HOSPITAL_COMMUNITY): Payer: Medicare Other

## 2016-09-30 ENCOUNTER — Encounter (HOSPITAL_COMMUNITY): Admission: EM | Disposition: A | Discharge status: Home Health | Payer: Self-pay | Source: Home / Self Care

## 2016-09-30 ENCOUNTER — Encounter (HOSPITAL_COMMUNITY): Payer: Self-pay | Admitting: Emergency Medicine

## 2016-09-30 ENCOUNTER — Emergency Department (HOSPITAL_COMMUNITY): Payer: Medicare Other

## 2016-09-30 ENCOUNTER — Inpatient Hospital Stay (HOSPITAL_COMMUNITY)
Admission: EM | Admit: 2016-09-30 | Discharge: 2016-10-06 | DRG: 853 | Disposition: A | Discharge status: Home Health | Payer: Medicare Other | Attending: Internal Medicine | Admitting: Internal Medicine

## 2016-09-30 DIAGNOSIS — Z4659 Encounter for fitting and adjustment of other gastrointestinal appliance and device: Secondary | ICD-10-CM

## 2016-09-30 DIAGNOSIS — G8929 Other chronic pain: Secondary | ICD-10-CM | POA: Diagnosis present

## 2016-09-30 DIAGNOSIS — E876 Hypokalemia: Secondary | ICD-10-CM | POA: Diagnosis present

## 2016-09-30 DIAGNOSIS — A408 Other streptococcal sepsis: Principal | ICD-10-CM | POA: Diagnosis present

## 2016-09-30 DIAGNOSIS — I248 Other forms of acute ischemic heart disease: Secondary | ICD-10-CM | POA: Diagnosis present

## 2016-09-30 DIAGNOSIS — E784 Other hyperlipidemia: Secondary | ICD-10-CM | POA: Diagnosis present

## 2016-09-30 DIAGNOSIS — E1100 Type 2 diabetes mellitus with hyperosmolarity without nonketotic hyperglycemic-hyperosmolar coma (NKHHC): Secondary | ICD-10-CM | POA: Diagnosis present

## 2016-09-30 DIAGNOSIS — Z79891 Long term (current) use of opiate analgesic: Secondary | ICD-10-CM | POA: Diagnosis not present

## 2016-09-30 DIAGNOSIS — D4981 Neoplasm of unspecified behavior of retina and choroid: Secondary | ICD-10-CM | POA: Diagnosis present

## 2016-09-30 DIAGNOSIS — E1169 Type 2 diabetes mellitus with other specified complication: Secondary | ICD-10-CM | POA: Diagnosis present

## 2016-09-30 DIAGNOSIS — W2203XA Walked into furniture, initial encounter: Secondary | ICD-10-CM | POA: Diagnosis present

## 2016-09-30 DIAGNOSIS — Z23 Encounter for immunization: Secondary | ICD-10-CM | POA: Diagnosis present

## 2016-09-30 DIAGNOSIS — R079 Chest pain, unspecified: Secondary | ICD-10-CM | POA: Diagnosis not present

## 2016-09-30 DIAGNOSIS — Z833 Family history of diabetes mellitus: Secondary | ICD-10-CM

## 2016-09-30 DIAGNOSIS — E1165 Type 2 diabetes mellitus with hyperglycemia: Secondary | ICD-10-CM | POA: Diagnosis present

## 2016-09-30 DIAGNOSIS — E1140 Type 2 diabetes mellitus with diabetic neuropathy, unspecified: Secondary | ICD-10-CM | POA: Diagnosis present

## 2016-09-30 DIAGNOSIS — R739 Hyperglycemia, unspecified: Secondary | ICD-10-CM | POA: Diagnosis not present

## 2016-09-30 DIAGNOSIS — E871 Hypo-osmolality and hyponatremia: Secondary | ICD-10-CM | POA: Diagnosis present

## 2016-09-30 DIAGNOSIS — K219 Gastro-esophageal reflux disease without esophagitis: Secondary | ICD-10-CM | POA: Diagnosis present

## 2016-09-30 DIAGNOSIS — M549 Dorsalgia, unspecified: Secondary | ICD-10-CM | POA: Diagnosis present

## 2016-09-30 DIAGNOSIS — F329 Major depressive disorder, single episode, unspecified: Secondary | ICD-10-CM | POA: Diagnosis present

## 2016-09-30 DIAGNOSIS — T148XXA Other injury of unspecified body region, initial encounter: Secondary | ICD-10-CM

## 2016-09-30 DIAGNOSIS — M726 Necrotizing fasciitis: Secondary | ICD-10-CM | POA: Diagnosis present

## 2016-09-30 DIAGNOSIS — M869 Osteomyelitis, unspecified: Secondary | ICD-10-CM

## 2016-09-30 DIAGNOSIS — S91011A Laceration without foreign body, right ankle, initial encounter: Secondary | ICD-10-CM | POA: Diagnosis present

## 2016-09-30 DIAGNOSIS — L8990 Pressure ulcer of unspecified site, unspecified stage: Secondary | ICD-10-CM | POA: Diagnosis present

## 2016-09-30 DIAGNOSIS — L089 Local infection of the skin and subcutaneous tissue, unspecified: Secondary | ICD-10-CM | POA: Diagnosis present

## 2016-09-30 DIAGNOSIS — E1151 Type 2 diabetes mellitus with diabetic peripheral angiopathy without gangrene: Secondary | ICD-10-CM | POA: Diagnosis present

## 2016-09-30 DIAGNOSIS — R531 Weakness: Secondary | ICD-10-CM

## 2016-09-30 DIAGNOSIS — Z794 Long term (current) use of insulin: Secondary | ICD-10-CM | POA: Diagnosis not present

## 2016-09-30 DIAGNOSIS — Z880 Allergy status to penicillin: Secondary | ICD-10-CM

## 2016-09-30 DIAGNOSIS — E118 Type 2 diabetes mellitus with unspecified complications: Secondary | ICD-10-CM

## 2016-09-30 DIAGNOSIS — J9600 Acute respiratory failure, unspecified whether with hypoxia or hypercapnia: Secondary | ICD-10-CM

## 2016-09-30 DIAGNOSIS — J9690 Respiratory failure, unspecified, unspecified whether with hypoxia or hypercapnia: Secondary | ICD-10-CM

## 2016-09-30 DIAGNOSIS — F17210 Nicotine dependence, cigarettes, uncomplicated: Secondary | ICD-10-CM | POA: Diagnosis present

## 2016-09-30 DIAGNOSIS — H5461 Unqualified visual loss, right eye, normal vision left eye: Secondary | ICD-10-CM | POA: Diagnosis present

## 2016-09-30 DIAGNOSIS — E785 Hyperlipidemia, unspecified: Secondary | ICD-10-CM | POA: Diagnosis present

## 2016-09-30 DIAGNOSIS — Z8249 Family history of ischemic heart disease and other diseases of the circulatory system: Secondary | ICD-10-CM

## 2016-09-30 DIAGNOSIS — Y92009 Unspecified place in unspecified non-institutional (private) residence as the place of occurrence of the external cause: Secondary | ICD-10-CM

## 2016-09-30 DIAGNOSIS — J45909 Unspecified asthma, uncomplicated: Secondary | ICD-10-CM | POA: Diagnosis present

## 2016-09-30 HISTORY — PX: IRRIGATION AND DEBRIDEMENT BUTTOCKS: SHX6601

## 2016-09-30 LAB — DIFFERENTIAL
BASOS ABS: 0 10*3/uL (ref 0.0–0.1)
Basophils Relative: 0 %
EOS ABS: 0 10*3/uL (ref 0.0–0.7)
Eosinophils Relative: 0 %
LYMPHS PCT: 5 %
Lymphs Abs: 1.4 10*3/uL (ref 0.7–4.0)
MONOS PCT: 6 %
Monocytes Absolute: 1.6 10*3/uL — ABNORMAL HIGH (ref 0.1–1.0)
NEUTROS ABS: 24.4 10*3/uL — AB (ref 1.7–7.7)
NEUTROS PCT: 89 %

## 2016-09-30 LAB — POCT I-STAT 7, (LYTES, BLD GAS, ICA,H+H)
ACID-BASE EXCESS: 3 mmol/L — AB (ref 0.0–2.0)
Bicarbonate: 28 mmol/L (ref 20.0–28.0)
CALCIUM ION: 1.09 mmol/L — AB (ref 1.15–1.40)
HEMATOCRIT: 33 % — AB (ref 36.0–46.0)
HEMOGLOBIN: 11.2 g/dL — AB (ref 12.0–15.0)
O2 SAT: 100 %
PH ART: 7.387 (ref 7.350–7.450)
PO2 ART: 484 mmHg — AB (ref 83.0–108.0)
Potassium: 2.9 mmol/L — ABNORMAL LOW (ref 3.5–5.1)
SODIUM: 139 mmol/L (ref 135–145)
TCO2: 29 mmol/L (ref 0–100)
pCO2 arterial: 47 mmHg (ref 32.0–48.0)

## 2016-09-30 LAB — PROTIME-INR
INR: 1.46
PROTHROMBIN TIME: 17.9 s — AB (ref 11.4–15.2)

## 2016-09-30 LAB — OSMOLALITY: OSMOLALITY: 299 mosm/kg — AB (ref 275–295)

## 2016-09-30 LAB — I-STAT CG4 LACTIC ACID, ED
Lactic Acid, Venous: 3 mmol/L (ref 0.5–1.9)
Lactic Acid, Venous: 2.25 mmol/L (ref 0.5–1.9)

## 2016-09-30 LAB — HEPATIC FUNCTION PANEL
ALBUMIN: 2.4 g/dL — AB (ref 3.5–5.0)
ALK PHOS: 112 U/L (ref 38–126)
ALT: 14 U/L (ref 14–54)
AST: 46 U/L — AB (ref 15–41)
BILIRUBIN INDIRECT: 0.6 mg/dL (ref 0.3–0.9)
Bilirubin, Direct: 0.3 mg/dL (ref 0.1–0.5)
TOTAL PROTEIN: 6.9 g/dL (ref 6.5–8.1)
Total Bilirubin: 0.9 mg/dL (ref 0.3–1.2)

## 2016-09-30 LAB — URINALYSIS, ROUTINE W REFLEX MICROSCOPIC
BILIRUBIN URINE: NEGATIVE
Glucose, UA: >1000 mg/dL — AB
Ketones, ur: NEGATIVE mg/dL
Leukocytes, UA: NEGATIVE
Nitrite: NEGATIVE
PH: 5 (ref 5.0–8.0)
Protein, ur: NEGATIVE mg/dL
SPECIFIC GRAVITY, URINE: 1.027 (ref 1.005–1.030)

## 2016-09-30 LAB — BASIC METABOLIC PANEL
ANION GAP: 17 — AB (ref 5–15)
ANION GAP: 12 (ref 5–15)
ANION GAP: 14 (ref 5–15)
Anion gap: 10 (ref 5–15)
BUN: 12 mg/dL (ref 6–20)
BUN: 10 mg/dL (ref 6–20)
BUN: 11 mg/dL (ref 6–20)
BUN: 8 mg/dL (ref 6–20)
CALCIUM: 8.5 mg/dL — AB (ref 8.9–10.3)
CHLORIDE: 90 mmol/L — AB (ref 101–111)
CHLORIDE: 94 mmol/L — AB (ref 101–111)
CO2: 22 mmol/L (ref 22–32)
CO2: 27 mmol/L (ref 22–32)
CO2: 22 mmol/L (ref 22–32)
CO2: 24 mmol/L (ref 22–32)
CREATININE: 0.83 mg/dL (ref 0.44–1.00)
CREATININE: 0.55 mg/dL (ref 0.44–1.00)
Calcium: 8.3 mg/dL — ABNORMAL LOW (ref 8.9–10.3)
Calcium: 8.5 mg/dL — ABNORMAL LOW (ref 8.9–10.3)
Calcium: 7.7 mg/dL — ABNORMAL LOW (ref 8.9–10.3)
Chloride: 85 mmol/L — ABNORMAL LOW (ref 101–111)
Chloride: 102 mmol/L (ref 101–111)
Creatinine, Ser: 1.06 mg/dL — ABNORMAL HIGH (ref 0.44–1.00)
Creatinine, Ser: 0.72 mg/dL (ref 0.44–1.00)
GFR calc Af Amer: >60 mL/min (ref >60)
GFR calc Af Amer: >60 mL/min (ref >60)
GFR calc non Af Amer: 59 mL/min — ABNORMAL LOW (ref >60)
GFR calc non Af Amer: >60 mL/min (ref >60)
GFR calc non Af Amer: >60 mL/min (ref >60)
GLUCOSE: 808 mg/dL — AB (ref 65–99)
GLUCOSE: 547 mg/dL — AB (ref 65–99)
Glucose, Bld: 602 mg/dL (ref 65–99)
Glucose, Bld: 347 mg/dL — ABNORMAL HIGH (ref 65–99)
POTASSIUM: 2.9 mmol/L — AB (ref 3.5–5.1)
POTASSIUM: 3.2 mmol/L — AB (ref 3.5–5.1)
POTASSIUM: 2.8 mmol/L — AB (ref 3.5–5.1)
Potassium: 3.4 mmol/L — ABNORMAL LOW (ref 3.5–5.1)
SODIUM: 129 mmol/L — AB (ref 135–145)
SODIUM: 136 mmol/L (ref 135–145)
Sodium: 124 mmol/L — ABNORMAL LOW (ref 135–145)
Sodium: 130 mmol/L — ABNORMAL LOW (ref 135–145)

## 2016-09-30 LAB — CBG MONITORING, ED
Glucose-Capillary: >600 mg/dL (ref 65–99)
Glucose-Capillary: 549 mg/dL (ref 65–99)

## 2016-09-30 LAB — SODIUM, URINE, RANDOM: Sodium, Ur: 17 mmol/L

## 2016-09-30 LAB — TRIGLYCERIDES: TRIGLYCERIDES: 247 mg/dL — AB (ref <150)

## 2016-09-30 LAB — GLUCOSE, CAPILLARY
GLUCOSE-CAPILLARY: 409 mg/dL — AB (ref 65–99)
GLUCOSE-CAPILLARY: 354 mg/dL — AB (ref 65–99)
Glucose-Capillary: 312 mg/dL — ABNORMAL HIGH (ref 65–99)

## 2016-09-30 LAB — URINE MICROSCOPIC-ADD ON: Bacteria, UA: NONE SEEN

## 2016-09-30 LAB — CORTISOL: Cortisol, Plasma: 30.8 ug/dL

## 2016-09-30 LAB — TROPONIN I: TROPONIN I: 0.09 ng/mL — AB (ref <0.03)

## 2016-09-30 LAB — CBC
HEMATOCRIT: 43.3 % (ref 36.0–46.0)
HEMOGLOBIN: 15.1 g/dL — AB (ref 12.0–15.0)
MCH: 28.4 pg (ref 26.0–34.0)
MCHC: 34.9 g/dL (ref 30.0–36.0)
MCV: 81.4 fL (ref 78.0–100.0)
Platelets: 233 10*3/uL (ref 150–400)
RBC: 5.32 MIL/uL — ABNORMAL HIGH (ref 3.87–5.11)
RDW: 13.7 % (ref 11.5–15.5)
WBC: 27.4 10*3/uL — ABNORMAL HIGH (ref 4.0–10.5)

## 2016-09-30 LAB — MAGNESIUM: Magnesium: 1.3 mg/dL — ABNORMAL LOW (ref 1.7–2.4)

## 2016-09-30 LAB — MRSA PCR SCREENING: MRSA BY PCR: NEGATIVE

## 2016-09-30 LAB — LIPASE, BLOOD: Lipase: 18 U/L (ref 11–51)

## 2016-09-30 LAB — OSMOLALITY, URINE: Osmolality, Ur: 484 mOsm/kg (ref 300–900)

## 2016-09-30 SURGERY — IRRIGATION AND DEBRIDEMENT BUTTOCKS
Anesthesia: General | Laterality: Right

## 2016-09-30 MED ORDER — PROPOFOL 10 MG/ML IV BOLUS
INTRAVENOUS | Status: AC
  Filled 2016-09-30: qty 20

## 2016-09-30 MED ORDER — SODIUM CHLORIDE 0.9 % IV SOLN
INTRAVENOUS | Status: DC
  Administered 2016-09-30 – 2016-10-01 (×2): via INTRAVENOUS

## 2016-09-30 MED ORDER — ROCURONIUM BROMIDE 100 MG/10ML IV SOLN
INTRAVENOUS | Status: DC | PRN
  Administered 2016-09-30: 50 mg via INTRAVENOUS

## 2016-09-30 MED ORDER — DEXTROSE 5 % IV SOLN: 2.0000 g | Freq: Once | INTRAVENOUS | Status: DC

## 2016-09-30 MED ORDER — ONDANSETRON HCL 4 MG/2ML IJ SOLN: 4.0000 mg | Freq: Once | INTRAMUSCULAR | Status: DC | PRN

## 2016-09-30 MED ORDER — SODIUM CHLORIDE 0.9 % IV SOLN
INTRAVENOUS | Status: DC
  Administered 2016-09-30 (×2): 1 [IU]/h via INTRAVENOUS
  Filled 2016-09-30: qty 2.5

## 2016-09-30 MED ORDER — PROPOFOL 500 MG/50ML IV EMUL
INTRAVENOUS | Status: DC | PRN
  Administered 2016-09-30: 20 ug/kg/min via INTRAVENOUS

## 2016-09-30 MED ORDER — MIDAZOLAM HCL 5 MG/5ML IJ SOLN
INTRAMUSCULAR | Status: DC | PRN
  Administered 2016-09-30: 2 mg via INTRAVENOUS

## 2016-09-30 MED ORDER — SODIUM CHLORIDE 0.9 % IV SOLN
30.0000 meq | Freq: Once | INTRAVENOUS | Status: AC
  Administered 2016-09-30 (×2): 30 meq via INTRAVENOUS
  Filled 2016-09-30: qty 15

## 2016-09-30 MED ORDER — DEXTROSE 5 % IV SOLN
1.0000 g | Freq: Three times a day (TID) | INTRAVENOUS | Status: DC
  Administered 2016-09-30 – 2016-10-02 (×5): 1 g via INTRAVENOUS
  Filled 2016-09-30 (×7): qty 1

## 2016-09-30 MED ORDER — INSULIN ASPART 100 UNIT/ML ~~LOC~~ SOLN
SUBCUTANEOUS | Status: DC | PRN
  Administered 2016-09-30: 5 [IU] via INTRAVENOUS

## 2016-09-30 MED ORDER — MEPERIDINE HCL 25 MG/ML IJ SOLN: 6.2500 mg | INTRAMUSCULAR | Status: DC | PRN

## 2016-09-30 MED ORDER — VANCOMYCIN HCL IN DEXTROSE 750-5 MG/150ML-% IV SOLN
750.0000 mg | Freq: Two times a day (BID) | INTRAVENOUS | Status: DC
  Administered 2016-10-01: 750 mg via INTRAVENOUS
  Filled 2016-09-30 (×2): qty 150

## 2016-09-30 MED ORDER — ONDANSETRON 4 MG PO TBDP
4.0000 mg | ORAL_TABLET | Freq: Four times a day (QID) | ORAL | Status: DC | PRN
  Filled 2016-09-30: qty 1

## 2016-09-30 MED ORDER — LEVOFLOXACIN IN D5W 750 MG/150ML IV SOLN: 750.0000 mg | Freq: Once | INTRAVENOUS | Status: DC

## 2016-09-30 MED ORDER — PROPOFOL 1000 MG/100ML IV EMUL
0.0000 ug/kg/min | INTRAVENOUS | Status: DC
  Administered 2016-09-30: 20 ug/kg/min via INTRAVENOUS
  Administered 2016-09-30 – 2016-10-01 (×2): 30 ug/kg/min via INTRAVENOUS
  Filled 2016-09-30 (×2): qty 100

## 2016-09-30 MED ORDER — IOPAMIDOL (ISOVUE-300) INJECTION 61%
INTRAVENOUS | Status: AC
  Administered 2016-09-30: 100 mL
  Filled 2016-09-30: qty 100

## 2016-09-30 MED ORDER — PHENYLEPHRINE HCL 10 MG/ML IJ SOLN
INTRAMUSCULAR | Status: DC | PRN
  Administered 2016-09-30 (×5): 80 ug via INTRAVENOUS

## 2016-09-30 MED ORDER — SUCCINYLCHOLINE CHLORIDE 20 MG/ML IJ SOLN
INTRAMUSCULAR | Status: DC | PRN
  Administered 2016-09-30: 120 mg via INTRAVENOUS

## 2016-09-30 MED ORDER — FENTANYL BOLUS VIA INFUSION
50.0000 ug | INTRAVENOUS | Status: DC | PRN
  Filled 2016-09-30: qty 50

## 2016-09-30 MED ORDER — DEXTROSE 5 % IV SOLN
2.0000 g | Freq: Once | INTRAVENOUS | Status: AC
  Administered 2016-09-30: 2 g via INTRAVENOUS
  Filled 2016-09-30: qty 2

## 2016-09-30 MED ORDER — ONDANSETRON HCL 4 MG/2ML IJ SOLN
4.0000 mg | Freq: Four times a day (QID) | INTRAMUSCULAR | Status: DC | PRN
  Administered 2016-10-02 (×2): 4 mg via INTRAVENOUS
  Filled 2016-09-30 (×2): qty 2

## 2016-09-30 MED ORDER — POTASSIUM CHLORIDE 2 MEQ/ML IV SOLN
30.0000 meq | Freq: Once | INTRAVENOUS | Status: AC
  Administered 2016-09-30: 30 meq via INTRAVENOUS
  Filled 2016-09-30: qty 15

## 2016-09-30 MED ORDER — SODIUM CHLORIDE 0.9 % IV BOLUS (SEPSIS)
1000.0000 mL | Freq: Once | INTRAVENOUS | Status: AC
  Administered 2016-09-30: 1000 mL via INTRAVENOUS

## 2016-09-30 MED ORDER — MIDAZOLAM HCL 2 MG/2ML IJ SOLN
INTRAMUSCULAR | Status: AC
  Filled 2016-09-30: qty 2

## 2016-09-30 MED ORDER — 0.9 % SODIUM CHLORIDE (POUR BTL) OPTIME
TOPICAL | Status: DC | PRN
  Administered 2016-09-30: 1000 mL

## 2016-09-30 MED ORDER — FENTANYL 2500MCG IN NS 250ML (10MCG/ML) PREMIX INFUSION
25.0000 ug/h | INTRAVENOUS | Status: DC
  Administered 2016-09-30: 50 ug/h via INTRAVENOUS
  Filled 2016-09-30: qty 250

## 2016-09-30 MED ORDER — FENTANYL CITRATE (PF) 100 MCG/2ML IJ SOLN
INTRAMUSCULAR | Status: DC | PRN
  Administered 2016-09-30: 100 ug via INTRAVENOUS

## 2016-09-30 MED ORDER — LIDOCAINE 2% (20 MG/ML) 5 ML SYRINGE
INTRAMUSCULAR | Status: AC
  Filled 2016-09-30: qty 5

## 2016-09-30 MED ORDER — ONDANSETRON HCL 4 MG/2ML IJ SOLN
4.0000 mg | Freq: Once | INTRAMUSCULAR | Status: AC
  Administered 2016-09-30: 4 mg via INTRAVENOUS
  Filled 2016-09-30: qty 2

## 2016-09-30 MED ORDER — POTASSIUM CHLORIDE CRYS ER 20 MEQ PO TBCR: 40.0000 meq | EXTENDED_RELEASE_TABLET | Freq: Once | ORAL | Status: DC

## 2016-09-30 MED ORDER — CHLORHEXIDINE GLUCONATE CLOTH 2 % EX PADS
6.0000 | MEDICATED_PAD | Freq: Once | CUTANEOUS | Status: AC
Start: 2016-10-01 — End: 2016-10-01
  Administered 2016-10-01: 6 via TOPICAL

## 2016-09-30 MED ORDER — HYDROMORPHONE HCL 1 MG/ML IJ SOLN: 0.2500 mg | INTRAMUSCULAR | Status: DC | PRN

## 2016-09-30 MED ORDER — CLINDAMYCIN PHOSPHATE 600 MG/50ML IV SOLN
600.0000 mg | Freq: Four times a day (QID) | INTRAVENOUS | Status: DC
  Administered 2016-09-30 – 2016-10-02 (×7): 600 mg via INTRAVENOUS
  Filled 2016-09-30 (×9): qty 50

## 2016-09-30 MED ORDER — VANCOMYCIN HCL IN DEXTROSE 1-5 GM/200ML-% IV SOLN
1000.0000 mg | Freq: Once | INTRAVENOUS | Status: AC
  Administered 2016-09-30: 1000 mg via INTRAVENOUS
  Filled 2016-09-30: qty 200

## 2016-09-30 MED ORDER — SODIUM CHLORIDE 0.9 % IV SOLN
INTRAVENOUS | Status: DC | PRN
  Administered 2016-09-30 (×2): via INTRAVENOUS

## 2016-09-30 MED ORDER — PROPOFOL 1000 MG/100ML IV EMUL
INTRAVENOUS | Status: AC
  Filled 2016-09-30: qty 100

## 2016-09-30 MED ORDER — FENTANYL CITRATE (PF) 100 MCG/2ML IJ SOLN
INTRAMUSCULAR | Status: AC
  Filled 2016-09-30: qty 4

## 2016-09-30 MED ORDER — LIDOCAINE HCL (CARDIAC) 20 MG/ML IV SOLN
INTRAVENOUS | Status: DC | PRN
  Administered 2016-09-30: 100 mg via INTRAVENOUS

## 2016-09-30 MED ORDER — CHLORHEXIDINE GLUCONATE CLOTH 2 % EX PADS
6.0000 | MEDICATED_PAD | Freq: Once | CUTANEOUS | Status: AC
  Administered 2016-09-30: 6 via TOPICAL

## 2016-09-30 MED ORDER — FAMOTIDINE IN NACL 20-0.9 MG/50ML-% IV SOLN
20.0000 mg | INTRAVENOUS | Status: DC
  Administered 2016-09-30 – 2016-10-01 (×2): 20 mg via INTRAVENOUS
  Filled 2016-09-30 (×3): qty 50

## 2016-09-30 MED ORDER — SODIUM CHLORIDE 0.9 % IV BOLUS (SEPSIS)
250.0000 mL | Freq: Once | INTRAVENOUS | Status: AC
  Administered 2016-09-30: 250 mL via INTRAVENOUS

## 2016-09-30 MED ORDER — PROPOFOL 10 MG/ML IV BOLUS
INTRAVENOUS | Status: DC | PRN
  Administered 2016-09-30: 70 mg via INTRAVENOUS

## 2016-09-30 MED ORDER — POTASSIUM CHLORIDE IN NACL 20-0.9 MEQ/L-% IV SOLN
INTRAVENOUS | Status: DC
  Administered 2016-09-30 – 2016-10-04 (×7): via INTRAVENOUS
  Filled 2016-09-30 (×10): qty 1000

## 2016-09-30 MED ORDER — FENTANYL CITRATE (PF) 100 MCG/2ML IJ SOLN: 50.0000 ug | Freq: Once | INTRAMUSCULAR | Status: DC

## 2016-09-30 MED ORDER — NOREPINEPHRINE BITARTRATE 1 MG/ML IV SOLN
0.0000 ug/min | INTRAVENOUS | Status: DC
  Administered 2016-09-30: 5 ug/min via INTRAVENOUS
  Filled 2016-09-30 (×2): qty 4

## 2016-09-30 MED ORDER — SUCCINYLCHOLINE CHLORIDE 200 MG/10ML IV SOSY
PREFILLED_SYRINGE | INTRAVENOUS | Status: AC
  Filled 2016-09-30: qty 10

## 2016-09-30 SURGICAL SUPPLY — 33 items
BNDG GAUZE ELAST 4 BULKY (GAUZE/BANDAGES/DRESSINGS) ×2 IMPLANT
CANISTER SUCTION 2500CC (MISCELLANEOUS) ×3 IMPLANT
COVER SURGICAL LIGHT HANDLE (MISCELLANEOUS) ×3 IMPLANT
DRAPE LAPAROSCOPIC ABDOMINAL (DRAPES) ×3 IMPLANT
DRAPE UTILITY XL STRL (DRAPES) ×6 IMPLANT
DRSG PAD ABDOMINAL 8X10 ST (GAUZE/BANDAGES/DRESSINGS) ×1 IMPLANT
ELECT CAUTERY BLADE 6.4 (BLADE) ×3 IMPLANT
ELECT REM PT RETURN 9FT ADLT (ELECTROSURGICAL) ×3
ELECTRODE REM PT RTRN 9FT ADLT (ELECTROSURGICAL) ×1 IMPLANT
GAUZE SPONGE 4X4 12PLY STRL (GAUZE/BANDAGES/DRESSINGS) ×1 IMPLANT
GLOVE BIO SURGEON STRL SZ8 (GLOVE) ×3 IMPLANT
GLOVE BIOGEL PI IND STRL 8 (GLOVE) ×1 IMPLANT
GLOVE BIOGEL PI INDICATOR 8 (GLOVE) ×2
GLOVE ECLIPSE 6.5 STRL STRAW (GLOVE) ×2 IMPLANT
GLOVE SS BIOGEL STRL SZ 8 (GLOVE) IMPLANT
GLOVE SUPERSENSE BIOGEL SZ 8 (GLOVE) ×2
GOWN CVR UNV OPN BCK APRN NK (MISCELLANEOUS) IMPLANT
GOWN ISOL THUMB LOOP REG UNIV (MISCELLANEOUS) ×18
GOWN STRL REUS W/ TWL LRG LVL3 (GOWN DISPOSABLE) ×1 IMPLANT
GOWN STRL REUS W/ TWL XL LVL3 (GOWN DISPOSABLE) ×1 IMPLANT
GOWN STRL REUS W/TWL LRG LVL3 (GOWN DISPOSABLE) ×3
GOWN STRL REUS W/TWL XL LVL3 (GOWN DISPOSABLE) ×6
KIT BASIN OR (CUSTOM PROCEDURE TRAY) ×3 IMPLANT
KIT ROOM TURNOVER OR (KITS) ×3 IMPLANT
NS IRRIG 1000ML POUR BTL (IV SOLUTION) ×3 IMPLANT
PACK GENERAL/GYN (CUSTOM PROCEDURE TRAY) ×3 IMPLANT
PAD ABD 8X10 STRL (GAUZE/BANDAGES/DRESSINGS) ×4 IMPLANT
PAD ARMBOARD 7.5X6 YLW CONV (MISCELLANEOUS) ×3 IMPLANT
SPECIMEN JAR MEDIUM (MISCELLANEOUS) ×2 IMPLANT
SWAB COLLECTION DEVICE MRSA (MISCELLANEOUS) ×2 IMPLANT
TOWEL OR 17X24 6PK STRL BLUE (TOWEL DISPOSABLE) ×3 IMPLANT
TOWEL OR 17X26 10 PK STRL BLUE (TOWEL DISPOSABLE) ×3 IMPLANT
TUBE ANAEROBIC SPECIMEN COL (MISCELLANEOUS) ×2 IMPLANT

## 2016-09-30 NOTE — ED Notes (Signed)
Dr Titus Mould at bedside

## 2016-09-30 NOTE — ED Triage Notes (Signed)
Pt coming from home via EMS. Pt hasn't been "feeling bad" for the past week and hasn't been eating so the pt hasn't taken her insulin since last Saturday. Pt's blood sugar via EMS was too high for the glucose machine to read. Pt has hx of PAD with two stents in the Right arm and two stents in the right leg. Pt woke up with right arm weakness this morning. 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 Pt has pressure ulcer to sacrum. Pt was board line hypotension in EMS with 108/32 and tachycardia with heart rate of 120.

## 2016-09-30 NOTE — Anesthesia Procedure Notes (Signed)
Procedure Name: Intubation Date/Time: 09/30/2016 6:41 PM Performed by: Shirlyn Goltz Pre-anesthesia Checklist: Patient identified, Emergency Drugs available, Suction available and Patient being monitored Patient Re-evaluated:Patient Re-evaluated prior to inductionOxygen Delivery Method: Circle system utilized Preoxygenation: Pre-oxygenation with 100% oxygen Intubation Type: IV induction Ventilation: Mask ventilation without difficulty Laryngoscope Size: Miller and 2 Grade View: Grade I Tube type: Oral Tube size: 7.0 mm Number of attempts: 1 Airway Equipment and Method: Stylet Placement Confirmation: ETT inserted through vocal cords under direct vision,  positive ETCO2 and breath sounds checked- equal and bilateral Secured at: 22 cm Tube secured with: Tape Dental Injury: Teeth and Oropharynx as per pre-operative assessment

## 2016-09-30 NOTE — Anesthesia Procedure Notes (Deleted)
Procedure Name: Intubation Date/Time: 09/30/2016 6:41 PM Performed by: Shirlyn Goltz Pre-anesthesia Checklist: Patient identified, Emergency Drugs available, Suction available, Patient being monitored and Timeout performed Patient Re-evaluated:Patient Re-evaluated prior to inductionOxygen Delivery Method: Circle system utilized Preoxygenation: Pre-oxygenation with 100% oxygen Intubation Type: IV induction and Rapid sequence Laryngoscope Size: Miller and 1 Grade View: Grade I Tube type: Oral (ETT placed by J. Aleister Lady, CRNA) Tube size: 7.5 mm Number of attempts: 1 Airway Equipment and Method: Stylet Placement Confirmation: ETT inserted through vocal cords under direct vision,  positive ETCO2 and breath sounds checked- equal and bilateral Secured at: 21 cm Tube secured with: Tape Dental Injury: Teeth and Oropharynx as per pre-operative assessment

## 2016-09-30 NOTE — Progress Notes (Signed)
Pharmacy Antibiotic Note  [name] is a 50 y.o. female admitted on 09/30/2016 with sepsis.  Pharmacy has been consulted for vancomycin and cefepime dosing. Pt is afebrile but WBC is elevated at 27.4. SCr is 1.06.   Plan: - Vancomycin 1gm IV x 1 then 750mg  IV Q12H - Cefepime 2gm IV x 1 then 1g IV Q8H - F/u renal fxn, C&S, clinical status and trough at SS  Height: 5\' 7"  (170.2 cm) Weight: 151 lb (68.5 kg) IBW/kg (Calculated) : 61.6  Temp (24hrs), Avg:98.6 F (37 C), Min:98.6 F (37 C), Max:98.6 F (37 C)   Recent Labs Lab 09/30/16 1226  WBC 27.4*  CREATININE 1.06*    Estimated Creatinine Clearance: 60.4 mL/min (by C-G formula based on SCr of 1.06 mg/dL (H)).    Allergies  Allergen Reactions  . Penicillins Other (See Comments)    Severe Headache    Antimicrobials this admission: Vanc 11/13>> Cefepime 11/13>>  Dose adjustments this admission: N/A  Microbiology results: Pending  Thank you for allowing pharmacy to be a part of this patient's care.  Zaidy Absher, Rande Lawman 09/30/2016 1:39 PM

## 2016-09-30 NOTE — ED Notes (Signed)
CRITICAL VALUE ALERT  Critical value received:  Glucose 808  Date of notification:  11/13  Time of notification:  2876  Critical value read back:Yes.    Nurse who received alert:  Jake Bathe, RN  MD notified (1st page):  Tegeler  Time of first page:  1315  MD notified (2nd page):  Time of second page:  Responding MD:  Tegelaer  Time MD responded:  1315

## 2016-09-30 NOTE — Anesthesia Postprocedure Evaluation (Signed)
Anesthesia Post Note  Patient: [name]  Procedure(s) Performed: Procedure(s) (LRB): DEBRIDEMENT RIGHT  BUTTOCK WOUND (Right)  Patient location during evaluation: SICU Anesthesia Type: General Level of consciousness: sedated Pain management: pain level controlled Vital Signs Assessment: post-procedure vital signs reviewed and stable Respiratory status: patient remains intubated per anesthesia plan Cardiovascular status: stable Anesthetic complications: no    Last Vitals:  Vitals:   09/30/16 1715 09/30/16 2016  BP: 108/74 119/81  Pulse: 112 94  Resp: 21 16  Temp:      Last Pain:  Vitals:   09/30/16 1540  TempSrc:   PainSc: 9                  Alvar Malinoski DAVID

## 2016-09-30 NOTE — Anesthesia Preprocedure Evaluation (Addendum)
Anesthesia Evaluation  Patient identified by MRN, date of birth, ID band Patient awake    Reviewed: Allergy & Precautions, NPO status , Patient's Chart, lab work & pertinent test results  Airway Mallampati: I  TM Distance: >3 FB Neck ROM: Full    Dental   Pulmonary asthma , Current Smoker,    Pulmonary exam normal        Cardiovascular + Peripheral Vascular Disease  Normal cardiovascular exam     Neuro/Psych Depression    GI/Hepatic   Endo/Other  diabetes  Renal/GU      Musculoskeletal  (+) Arthritis ,   Abdominal   Peds  Hematology   Anesthesia Other Findings   Reproductive/Obstetrics                            Anesthesia Physical Anesthesia Plan  ASA: III and emergent  Anesthesia Plan: General   Post-op Pain Management:    Induction: Intravenous, Rapid sequence and Cricoid pressure planned  Airway Management Planned: Oral ETT  Additional Equipment:   Intra-op Plan:   Post-operative Plan: Possible Post-op intubation/ventilation  Informed Consent: I have reviewed the patients History and Physical, chart, labs and discussed the procedure including the risks, benefits and alternatives for the proposed anesthesia with the patient or authorized representative who has indicated his/her understanding and acceptance.     Plan Discussed with: CRNA, Surgeon and Anesthesiologist  Anesthesia Plan Comments:        Anesthesia Quick Evaluation

## 2016-09-30 NOTE — Op Note (Signed)
09/30/2016  7:25 PM  PATIENT:  [name]  50 y.o. female  PRE-OPERATIVE DIAGNOSIS:  Necrotizing fascitis right buttock  POST-OPERATIVE DIAGNOSIS:  Necrotizing fascitis right buttock  PROCEDURE:  Procedure(s): DEBRIDEMENT RIGHT  BUTTOCK WOUND 16cm LONG X 11cm WIDE X 3cm DEEP  SURGEON:  Surgeon(s): Georganna Skeans, MD  ASSISTANTS: none   ANESTHESIA:   general  EBL:  40cc BLOOD ADMINISTERED:none  DRAINS: none   SPECIMEN:  Excision  DISPOSITION OF SPECIMEN:  PATHOLOGY  COUNTS:  YES  DICTATION: .Dragon Dictation Findings: Necrotizing soft tissue infection involving skin, subcutaneous fat, and musculature  1.  Progress note or procedure note with a detailed description of the procedure: [name] is brought for emergent debridement of necrotizing soft tissue infection of her right buttock. Informed consent was obtained. She is receiving broad-spectrum intravenous antibiotics. Her site was marked. She is brought to the operating room and general endotracheal anesthesia was administered by the anesthesia staff. She is receiving insulin infusion for DKA as well. She was placed in prone position in her right buttock area was widely prepped and draped in a sterile fashion. We did time out procedure. Cautery was used to make a linear incision around the area of visible erythema. Dissection was carried down through the soft tissues to the muscle which was dead. I continued to make incisions more cephalad until I reached viable tissue. Subsequently all of the necrotic tissue was then dissected down using cautery and good hemostasis was obtained along the way. We then continued medially making a larger incision to chase down all necrotic tissue. There was good viable tissue as we got closer to the anal area. Next I proceeded inferiorly debriding further musculature and then did fat and skin. Finally, there was some extension of necrotic musculature out laterally so I made a larger incision to  encompass all of this. All the dead tissue was removed and sent to pathology. Final dimensions of the debridement were 16 cm long by 11 cm wide by 3 cm deep. Area was copiously irrigated with warm saline. Hemostasis was then ensured with cautery and I packed with Kerlix saline wet-to-dry sterile dressing. She will remain on the ventilator go directly to the intensive care unit for further resuscitation, treatment of sepsis and DKA. We will reevaluate for need for further surgical debridement and another 12 hours.  2.  Tool used for debridement : cautery  3.  Frequency of surgical debridement.   First event  4.  Measurement of total devitalized tissue (wound surface) before and after surgical debridement.   16cmx11cmx3cm  5.  Area and depth of devitalized tissue removed from wound.  16cmx11cmx3cm  6.  Blood loss and description of tissue removed. Necrotic skin, soft tissue, fascia, and muscle tissue, EBL 40 mL  7.  Evidence of the progress of the wound's response to treatment.  A.  Current wound volume 16 cm X11 CM X3 CM  B.  Presence (and extent of) of infection. Infection present  C.  Presence (and extent of) of non viable tissue.  All visible nonviable tissue removed  D.  Other material in the wound that is expected to inhibit healing. N/A  8.  Was there any viable tissue removed (measurements): Minimal  PATIENT DISPOSITION:  ICU - intubated and critically ill.   Delay start of Pharmacological VTE agent (>24hrs) due to surgical blood loss or risk of bleeding:  no  Georganna Skeans, MD, MPH, FACS Pager: [phone]  11/13/20177:25 PM

## 2016-09-30 NOTE — ED Provider Notes (Signed)
[facility] DEPT Provider Note   CSN: [id number] Arrival date & time: 09/30/16  1159     History   Chief Complaint Chief Complaint  Patient presents with  . Hyperglycemia/weakness    HPI [name] is a 50 y.o. female with a past medical history significant for diabetes, peripheral vascular disease s/p arterial bypass grafting in her right arm and right leg, asthma, hyperlipidemia, right eye blindness, and Depression who presents with chills, malaise, hyperglycemia, back pain, new ulcer, and right-sided numbness/weakness. Patient is accompanied by family who report that patient has been "feeling bad" for the last week. She reports that she has not taken her insulin for the last week because she has "not been eating". She says that she has been having subjective fevers and chills. She denies chest pain, shortness of breath, cough, or abdominal pain but does report significant right low back and hip pain. Patient denies dysuria but does report increase in urination. Patient denies constipation but does report some mild diarrhea.   Husband reports that over the last few days he has noticed an ulcer on the patient's right gluteus muscle. He is concerned that it might be infected and he was planning on having the patient see her doctor for ABX.   The primary concern for the patient and family was that this morning, the patient woke up with right upper extremity numbness and worsened right upper extremity and right from the weakness. They report that the patient has persistent right-sided weakness in her arm and leg at baseline. Husband also thinks that the patient has a slight right eye ptosis. He reports that she has had neurologic problems in the past when her sugars were high.  She denies any nausea, vomiting and is not tried any medications for her symptoms.     The history is provided by the patient, medical records and the spouse. No language interpreter was used.  Rash   This  is a new problem. The current episode started more than 2 days ago. The problem has been gradually worsening. Maximum temperature: subjective fevers. The rash is present on the right buttock. The pain is at a severity of 10/10. The pain is severe. The pain has been worsening since onset. She has tried nothing for the symptoms.  Neurologic Problem  This is a new problem. The current episode started 12 to 24 hours ago. The problem occurs constantly. The problem has been gradually improving. Pertinent negatives include no chest pain, no abdominal pain, no headaches and no shortness of breath. Nothing aggravates the symptoms. Nothing relieves the symptoms. She has tried nothing for the symptoms.  Hyperglycemia  Blood sugar level PTA:  "High" Severity:  Severe Onset quality:  Gradual Duration:  1 week Timing:  Constant Progression:  Unable to specify Chronicity:  New Diabetes status:  Controlled with insulin Time since last antidiabetic medication:  1 week Relieved by:  Nothing Ineffective treatments:  None tried Associated symptoms: fever, polyuria and weakness   Associated symptoms: no abdominal pain, no chest pain, no confusion, no dizziness, no nausea, no shortness of breath and no vomiting     Past Medical History:  Diagnosis Date  . Arthritis   . Asthma   . Chronic back pain   . Depression   . Diabetes mellitus   . Hyperlipidemia   . Joint pain   . Leg pain    With Walking  . PAD (peripheral artery disease) ([place])   . Peripheral arterial disease ([place])   .  Reflux   . Ulcer [facility])    Foot    Patient Active Problem List   Diagnosis Date Noted  . Bilateral subclavian artery occlusion ([place]) 03/05/2016  . Paresthesia of both hands 03/05/2016  . DDD (degenerative disc disease), lumbosacral 03/26/2015  . DDD (degenerative disc disease), cervical 03/26/2015  . Sacroiliac joint disease 03/26/2015  . Occipital neuralgia 03/26/2015  . Hip pain 10/07/2014  . Arm heaviness 04/25/2014    . Weakness of both legs-Arms 04/25/2014  . Pain in limb-Bilateral Leg 04/25/2014  . PAD (peripheral artery disease) ([place]) 04/05/2013  . 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 Peripheral vascular disease, unspecified 02/10/2012  . 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 TRIGGER FINGER 02/27/2009    Past Surgical History:  Procedure Laterality Date  . ABDOMINAL AORTAGRAM  June 15, 2014  . ABDOMINAL AORTAGRAM N/A 06/15/2014   Procedure: ABDOMINAL Maxcine Ham;  Surgeon: Serafina Mitchell, MD;  Location: [facility] CATH LAB;  Service: Cardiovascular;  Laterality: N/A;  . ABDOMINAL AORTAGRAM N/A 11/22/2014   Procedure: ABDOMINAL AORTAGRAM;  Surgeon: Serafina Mitchell, MD;  Location: [facility] CATH LAB;  Service: Cardiovascular;  Laterality: N/A;  . ARTERIAL BYPASS SURGRY  07/05/2010   Right Common Femoral to below knee popliteal BPG  . BACK SURGERY     X's  2  . CARDIAC CATHETERIZATION    . CHOLECYSTECTOMY     Gall Bladder  . CYSTECTOMY Right    foot  . CYSTECTOMY Left    wrist  . INTERCOSTAL NERVE BLOCK  November 2015  . left foot surgery    . left wrist cyst removal Left   . PERIPHERAL VASCULAR CATHETERIZATION N/A 05/07/2016   Procedure: Abdominal Aortogram;  Surgeon: Serafina Mitchell, MD;  Location: [place] CV LAB;  Service: Cardiovascular;  Laterality: N/A;  . PERIPHERAL VASCULAR CATHETERIZATION N/A 05/07/2016   Procedure: Lower Extremity Angiography;  Surgeon: Serafina Mitchell, MD;  Location: [place] CV LAB;  Service: Cardiovascular;  Laterality: N/A;  . PERIPHERAL VASCULAR CATHETERIZATION N/A 05/07/2016   Procedure: Aortic Arch Angiography;  Surgeon: Serafina Mitchell, MD;  Location: [place] CV LAB;  Service: Cardiovascular;  Laterality: N/A;  . PERIPHERAL VASCULAR CATHETERIZATION N/A 05/07/2016   Procedure: Upper Extremity Angiography;  Surgeon: Serafina Mitchell, MD;  Location: [place] CV LAB;  Service: Cardiovascular;  Laterality: N/A;  . PERIPHERAL  VASCULAR CATHETERIZATION Right 05/07/2016   Procedure: Peripheral Vascular Balloon Angioplasty;  Surgeon: Serafina Mitchell, MD;  Location: [place] CV LAB;  Service: Cardiovascular;  Laterality: Right;  subclavian  . PERIPHERAL VASCULAR CATHETERIZATION Right 05/07/2016   Procedure: Peripheral Vascular Intervention;  Surgeon: Serafina Mitchell, MD;  Location: [place] CV LAB;  Service: Cardiovascular;  Laterality: Right;  External  Iliac  . SKIN GRAFT Right 2012   [name] by Dr. Nils Pyle- Right and Left Ankle  . SPINE SURGERY    . TONSILLECTOMY      OB History    No data available       Home Medications    Prior to Admission medications   Medication Sig Start Date End Date Taking? Authorizing Provider  albuterol (PROVENTIL HFA;VENTOLIN HFA) 108 (90 Base) MCG/ACT inhaler Inhale 2 puffs into the lungs as needed for wheezing or shortness of breath. Reported on 02/28/2016    Historical Provider, MD  albuterol (PROVENTIL) 2 MG tablet Take 2 mg by mouth 3 (three) times daily as needed.     Historical Provider, MD  aspirin 81 MG tablet Take 81 mg by mouth daily.    Historical Provider, MD  Blood Glucose Monitoring Suppl (ACCU-CHEK AVIVA PLUS) w/Device KIT  10/05/15   Historical Provider, MD  cetirizine (ZYRTEC) 10 MG tablet Take 10 mg by mouth daily. Reported on 03/27/2016    Historical Provider, MD  Cholecalciferol (VITAMIN D3) 50000 units CAPS Take 50,000 Units by mouth once a week. Reported on 04/08/2016    Historical Provider, MD  Choline Fenofibrate (FENOFIBRIC ACID) 45 MG CPDR Take 1 tablet by mouth daily. Reported on 03/18/2016 10/30/15   Historical Provider, MD  cyclobenzaprine (FLEXERIL) 10 MG tablet Limit 1 tablet by mouth 1  to  3 times per day if tolerated 07/18/16   Mohammed Kindle, MD  doxycycline (VIBRA-TABS) 100 MG tablet Take 100 mg by mouth See admin instructions. Takes for 10 days when receiving injections in back/neck - every 2 months 10/05/15   Historical Provider, MD  furosemide  (LASIX) 40 MG tablet Take 40 mg by mouth 2 (two) times daily as needed for fluid.     Historical Provider, MD  HUMALOG KWIKPEN 100 UNIT/ML KiwkPen Inject 10 Units into the skin 3 (three) times daily. Reported on 04/08/2016 11/11/15   Historical Provider, MD  HYDROcodone-acetaminophen [facility]) 10-325 MG tablet Limit 1 tablet by mouth per day or twice per day if tolerated 07/18/16   Mohammed Kindle, MD  LANTUS SOLOSTAR 100 UNIT/ML Solostar Pen Inject 60 Units into the skin at bedtime.  09/15/14   Historical Provider, MD  loperamide (IMODIUM A-D) 2 MG tablet Take 2 mg by mouth as needed for diarrhea or loose stools.    Historical Provider, MD  meclizine (ANTIVERT) 25 MG tablet Take 25 mg by mouth 3 (three) times daily as needed for dizziness.    Historical Provider, MD  naproxen sodium (ANAPROX) 220 MG tablet Take 220 mg by mouth 2 (two) times daily as needed (pain). Reported on 03/18/2016    Historical Provider, MD  nicotine (NICOTROL) 10 MG inhaler Inhale 1 continuous puffing into the lungs as needed for smoking cessation.    Historical Provider, MD  omeprazole (PRILOSEC) 20 MG capsule Take 20 mg by mouth daily.    Historical Provider, MD  pregabalin (LYRICA) 100 MG capsule Limit 1 tab by mouth twice a day to 3 times a day if tolerated 07/18/16   Mohammed Kindle, MD  pregabalin (LYRICA) 100 MG capsule Limit 1 tab by mouth twice a day to 3 times a day if tolerated 07/18/16   Mohammed Kindle, MD  pseudoephedrine-guaifenesin [facility] D) 60-600 MG per tablet Take 1 tablet by mouth every 12 (twelve) hours as needed for congestion. Reported on 04/08/2016    Historical Provider, MD  simethicone (MYLICON) 962 MG chewable tablet Chew 125 mg by mouth every 6 (six) hours as needed for flatulence.    Historical Provider, MD    Family History Family History  Problem Relation Age of Onset  . Coronary artery disease Mother   . Peripheral vascular disease Mother   . Heart disease Mother     Before age 7  . Other Mother      Venous insuffiency  . Diabetes Mother   . Hyperlipidemia Mother   . Hypertension Mother   . Varicose Veins Mother   . Heart attack Mother     before age 80  . Heart disease Father   . Diabetes Father   . Diabetes Maternal Grandmother   . Diabetes Paternal Grandmother   . Diabetes Paternal  Grandfather   . Diabetes Sister   . Hypertension Sister   . Diabetes Brother   . Hypertension Brother     Social History Social History  Substance Use Topics  . Smoking status: Former Smoker    Packs/day: 0.20    Years: 30.00    Types: Cigarettes    Quit date: 04/29/2016  . Smokeless tobacco: Never Used     Comment: 2 cigarrettes a day  . Alcohol use No     Allergies   Penicillins   Review of Systems Review of Systems  Constitutional: Positive for appetite change (eating less), chills and fever.  HENT: Negative for congestion and rhinorrhea.   Eyes: Positive for visual disturbance (right eye blindness at baseline).  Respiratory: Negative for cough, chest tightness, shortness of breath, wheezing and stridor.   Cardiovascular: Negative for chest pain and palpitations.  Gastrointestinal: Positive for diarrhea. Negative for abdominal pain, constipation, nausea and vomiting.  Endocrine: Positive for polyuria.  Genitourinary: Positive for flank pain.  Musculoskeletal: Positive for back pain. Negative for neck pain and neck stiffness.  Skin: Positive for rash and wound.  Neurological: Positive for weakness and numbness. Negative for dizziness, light-headedness and headaches.  Psychiatric/Behavioral: Negative for agitation and confusion.  All other systems reviewed and are negative.    Physical Exam Updated Vital Signs BP 114/70   Pulse 114   Temp 98.6 F (37 C) (Oral)   Resp 14   Ht '5\' 7"'  (1.702 m)   Wt 151 lb (68.5 kg)   SpO2 100%   BMI 23.65 kg/m   Physical Exam  Constitutional: She is oriented to person, place, and time. She appears well-developed and well-nourished. No  distress.  HENT:  Head: Normocephalic and atraumatic.    Mouth/Throat: Oropharynx is clear and moist. No oropharyngeal exudate.  Eyes: Conjunctivae and EOM are normal. Pupils are equal, round, and reactive to light.  Neck: Neck supple.  Cardiovascular: Regular rhythm.  Tachycardia present.   No murmur heard. Pulmonary/Chest: Effort normal and breath sounds normal. No respiratory distress. She has no wheezes. She exhibits no tenderness.  Abdominal: Soft. There is no tenderness.  Musculoskeletal: She exhibits tenderness. She exhibits no edema.       Left hip: She exhibits tenderness and swelling.       Legs: Neurological: She is alert and oriented to person, place, and time. She is not disoriented. She displays no tremor and normal reflexes. No cranial nerve deficit or sensory deficit. She exhibits abnormal muscle tone. Coordination normal.  Right upper extremity grip strength decreased compared to left. Right upper extremity numbness compared left. Right lurched from a weakness with leg raise compared to left.    Skin: Skin is warm and dry. Capillary refill takes less than 2 seconds. Rash noted. There is erythema.  Psychiatric: She has a normal mood and affect.  Nursing note and vitals reviewed.    ED Treatments / Results  Labs (all labs ordered are listed, but only abnormal results are displayed) Labs Reviewed  BASIC METABOLIC PANEL - Abnormal; Notable for the following:       Result Value   Sodium 124 (*)    Potassium 3.4 (*)    Chloride 85 (*)    Glucose, Bld 808 (*)    Creatinine, Ser 1.06 (*)    Calcium 8.5 (*)    GFR calc non Af Amer 59 (*)    Anion gap 17 (*)    All other components within normal limits  CBC -  Abnormal; Notable for the following:    WBC 27.4 (*)    RBC 5.32 (*)    Hemoglobin 15.1 (*)    All other components within normal limits  URINALYSIS, ROUTINE W REFLEX MICROSCOPIC (NOT AT [facility]) - Abnormal; Notable for the following:    APPearance CLOUDY (*)      Glucose, UA >1000 (*)    Hgb urine dipstick MODERATE (*)    All other components within normal limits  PROTIME-INR - Abnormal; Notable for the following:    Prothrombin Time 17.9 (*)    All other components within normal limits  DIFFERENTIAL - Abnormal; Notable for the following:    Neutro Abs 24.4 (*)    Monocytes Absolute 1.6 (*)    All other components within normal limits  HEPATIC FUNCTION PANEL - Abnormal; Notable for the following:    Albumin 2.4 (*)    AST 46 (*)    All other components within normal limits  BASIC METABOLIC PANEL - Abnormal; Notable for the following:    Sodium 129 (*)    Potassium 2.9 (*)    Chloride 90 (*)    Glucose, Bld 602 (*)    Calcium 8.3 (*)    All other components within normal limits  URINE MICROSCOPIC-ADD ON - Abnormal; Notable for the following:    Squamous Epithelial / LPF 6-30 (*)    All other components within normal limits  TROPONIN I - Abnormal; Notable for the following:    Troponin I 0.09 (*)    All other components within normal limits  OSMOLALITY - Abnormal; Notable for the following:    Osmolality 299 (*)    All other components within normal limits  MAGNESIUM - Abnormal; Notable for the following:    Magnesium 1.3 (*)    All other components within normal limits  BASIC METABOLIC PANEL - Abnormal; Notable for the following:    Sodium 130 (*)    Potassium 3.2 (*)    Chloride 94 (*)    Glucose, Bld 547 (*)    Calcium 8.5 (*)    All other components within normal limits  GLUCOSE, CAPILLARY - Abnormal; Notable for the following:    Glucose-Capillary 409 (*)    All other components within normal limits  CBG MONITORING, ED - Abnormal; Notable for the following:    Glucose-Capillary >600 (*)    All other components within normal limits  I-STAT CG4 LACTIC ACID, ED - Abnormal; Notable for the following:    Lactic Acid, Venous 3.00 (*)    All other components within normal limits  I-STAT CG4 LACTIC ACID, ED - Abnormal; Notable  for the following:    Lactic Acid, Venous 2.25 (*)    All other components within normal limits  CBG MONITORING, ED - Abnormal; Notable for the following:    Glucose-Capillary 549 (*)    All other components within normal limits  POCT I-STAT 7, (LYTES, BLD GAS, ICA,H+H) - Abnormal; Notable for the following:    pO2, Arterial 484.0 (*)    Acid-Base Excess 3.0 (*)    Potassium 2.9 (*)    Calcium, Ion 1.09 (*)    HCT 33.0 (*)    Hemoglobin 11.2 (*)    All other components within normal limits  CULTURE, BLOOD (ROUTINE X 2)  CULTURE, BLOOD (ROUTINE X 2)  URINE CULTURE  BODY FLUID CULTURE  AEROBIC/ANAEROBIC CULTURE (SURGICAL/DEEP WOUND)  MRSA PCR SCREENING  LIPASE, BLOOD  CORTISOL  CBC WITH DIFFERENTIAL/PLATELET  BASIC METABOLIC PANEL  BASIC METABOLIC PANEL  BASIC METABOLIC PANEL  BASIC METABOLIC PANEL  SODIUM, URINE, RANDOM  OSMOLALITY, URINE  SURGICAL PATHOLOGY    EKG  EKG Interpretation None       Radiology Dg Chest 2 View  Result Date: 09/30/2016 CLINICAL DATA:  RIGHT-sided weakness, chills, subjective fever. RIGHT gluteal infection and severe pain. Fall 2 months ago. History of diabetes. EXAM: CHEST  2 VIEW COMPARISON:  Chest radiograph June 27, 2011 FINDINGS: Cardiomediastinal silhouette is normal. Mild bronchitic changes. No pleural effusions or focal consolidations. Trachea projects midline and there is no pneumothorax. Soft tissue planes and included osseous structures are non-suspicious. Surgical clips in the included right abdomen compatible with cholecystectomy. IMPRESSION: Mild bronchitic changes. Electronically Signed   By: Elon Alas M.D.   On: 09/30/2016 14:20   Ct Head Wo Contrast  Result Date: 09/30/2016 CLINICAL DATA:  50 year old female with right side weakness, right arm numbness, "right eye droop". Initial encounter. EXAM: CT HEAD WITHOUT CONTRAST TECHNIQUE: Contiguous axial images were obtained from the base of the skull through the vertex  without intravenous contrast. COMPARISON:  Face CT without contrast 02/14/2011. Brain MRI 09/14/2007. FINDINGS: Brain: No midline shift, ventriculomegaly, mass effect, evidence of mass lesion, intracranial hemorrhage or evidence of cortically based acute infarction. Gray-white matter differentiation is within normal limits throughout the brain. Vascular: Calcified atherosclerosis at the skull base. No suspicious intracranial vascular hyperdensity. Skull: No osseous abnormality identified. Sinuses/Orbits: Mild bubbly opacity and mucosal thickening in the posterior right ethmoid air cell. Other Visualized paranasal sinuses and mastoids are stable and well pneumatized. Other: Abnormal right globe with posterior hyperdensity with a v-shaped extension toward the lens (series 2, image 5). The right globe was normal on prior studies. The other visible bilateral orbits soft tissues are normal. Visualized scalp soft tissues are within normal limits. IMPRESSION: 1. Abnormal right globe, new since 2012. Top differential considerations include right retinal tumor, retinal tear, or other vitreous hemorrhage. The appearance is not typical of retinal or choroidal detachment, or post treatment changes to the globe. 2.  Normal noncontrast CT appearance of the brain. 3. Mild acute inflammation of the posterior right ethmoid air cell. Electronically Signed   By: Genevie Ann M.D.   On: 09/30/2016 15:27   Ct Abdomen Pelvis W Contrast  Result Date: 09/30/2016 CLINICAL DATA:  50 year old female with right sided extremity weakness, chills, subjective fevers and right gluteus infection with severe pain. EXAM: CT ABDOMEN AND PELVIS WITH CONTRAST TECHNIQUE: Multidetector CT imaging of the abdomen and pelvis was performed using the standard protocol following bolus administration of intravenous contrast. CONTRAST:  127m ISOVUE-300 IOPAMIDOL (ISOVUE-300) INJECTION 61% COMPARISON:  03/01/2008 CT angiogram of the abdomen and pelvis. FINDINGS:  Lower chest: Hypoventilatory changes in the dependent lung bases. Coronary atherosclerosis. Hepatobiliary: Normal liver size. Hypodense subcapsular 1.2 cm liver lesion in the far inferior right liver lobe (series 2/ image 41), new since 03/01/2008. No additional liver lesions. Cholecystectomy. No biliary ductal dilatation. Pancreas: Normal, with no mass or duct dilation. Spleen: Normal size spleen. There are 5 hypodense lesions scattered throughout the spleen, largest 1.4 cm, not easily compared to the 03/01/2008 CT study, which was performed at a different phase of contrast. Adrenals/Urinary Tract: Normal adrenals. There is symmetric fullness of the central renal collecting systems without overt hydronephrosis. No renal mass. Distended bladder without bladder wall thickening. Stomach/Bowel: Grossly normal stomach. Normal caliber small bowel with no small bowel wall thickening. Normal appendix. Normal large bowel with no diverticulosis, large bowel wall  thickening or pericolonic fat stranding. Vascular/Lymphatic: Atherosclerotic nonaneurysmal abdominal aorta. Right external iliac artery and right common femoral artery stents are in place. Apparent occlusion of the right common iliac artery. Patent portal, hepatic, splenic and renal veins. No pathologically enlarged lymph nodes in the abdomen or pelvis. Reproductive: Grossly normal uterus.  No adnexal mass. Other: No pneumoperitoneum, ascites or focal fluid collection. There is extensive soft tissue gas in the subcutaneous and gluteal muscle tissues of the right lower buttock. There is extensive soft tissue gas extending into the right ischiorectal fat and right hip adductor muscles. No focal fluid collections. Musculoskeletal: No aggressive appearing focal osseous lesions. Status post bilateral posterior spinal fusion at L5-S1 with bone cages in the L4-5 and L5-S1 disc spaces. Healed deformity in the medial right iliac bone from bone graft harvest defect. Mild  thoracolumbar spondylosis. IMPRESSION: 1. Extensive soft tissue gas in the subcutaneous soft tissues and gluteal muscles of the right lower buttock. Extensive soft tissue gas extends into the right ischiorectal fat and right hip adductor muscles. No focal fluid collections. Please note that the inferior extent of this process is not demonstrated on this CT study. These findings are worrisome for necrotizing fasciitis and urgent surgical consultation is advised. 2. 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 3. Symmetric fullness of the renal collecting systems without overt hydronephrosis, probably due to bladder distention. Correlate clinically to exclude bladder outlet obstruction. 4. **An incidental finding of potential clinical significance has been found. New subcapsular low-attenuation inferior right liver lobe lesion, indeterminate. Several small low-attenuation splenic lesions, not definitely seen on 2009 CT, indeterminate. MRI of the abdomen without and with IV contrast is recommended on a short term outpatient basis. ** 5. Coronary atherosclerosis. These results were called by telephone at the time of interpretation on 09/30/2016 at 3:45 pm to Dr. Marda Stalker , who verbally acknowledged these results. Electronically Signed   By: Ilona Sorrel M.D.   On: 09/30/2016 15:46    Procedures Procedures (including critical care time)  CRITICAL CARE Performed by: Gwenyth Allegra Kaydie Petsch Total critical care time: 45 minutes Critical care time was exclusive of separately billable procedures and treating other patients. Critical care was necessary to treat or prevent imminent or life-threatening deterioration. Critical care was time spent personally by me on the following activities: development of treatment plan with patient and/or surrogate as well as nursing, discussions with consultants, evaluation of patient's response to treatment,  examination of patient, obtaining history from patient or surrogate, ordering and performing treatments and interventions, ordering and review of laboratory studies, ordering and review of radiographic studies, pulse oximetry and re-evaluation of patient's condition.    Medications Ordered in ED Medications  vancomycin (VANCOCIN) IVPB 750 mg/150 ml premix ( Intravenous MAR Unhold 09/30/16 2003)  ceFEPIme (MAXIPIME) 1 g in dextrose 5 % 50 mL IVPB ( Intravenous MAR Unhold 09/30/16 2003)  clindamycin (CLEOCIN) IVPB 600 mg ( Intravenous MAR Unhold 09/30/16 2003)  0.9 %  sodium chloride infusion (not administered)  insulin regular (NOVOLIN R,HUMULIN R) 250 Units in sodium chloride 0.9 % 250 mL (1 Units/mL) infusion (1 Units/hr Intravenous New Bag/Given 09/30/16 1823)  potassium chloride SA (K-DUR,KLOR-CON) CR tablet 40 mEq ( Oral MAR Unhold 09/30/16 2003)  potassium chloride 30 mEq in sodium chloride 0.9 % 265 mL (KCL MULTIRUN) IVPB ( Intravenous MAR Unhold 09/30/16 2003)  sodium chloride 0.9 % bolus 1,000 mL (0 mLs Intravenous Stopped 09/30/16 1346)    And  sodium  chloride 0.9 % bolus 1,000 mL (0 mLs Intravenous Stopped 09/30/16 1534)    And  sodium chloride 0.9 % bolus 250 mL (0 mLs Intravenous Stopped 09/30/16 1545)  ondansetron (ZOFRAN) injection 4 mg (4 mg Intravenous Given 09/30/16 1302)  potassium chloride 30 mEq in sodium chloride 0.9 % 265 mL (KCL MULTIRUN) IVPB (30 mEq Intravenous Given 09/30/16 1535)  vancomycin (VANCOCIN) IVPB 1000 mg/200 mL premix (0 mg Intravenous Stopped 09/30/16 1535)  ceFEPIme (MAXIPIME) 2 g in dextrose 5 % 50 mL IVPB (0 g Intravenous Stopped 09/30/16 1535)  iopamidol (ISOVUE-300) 61 % injection (100 mLs  Contrast Given 09/30/16 1443)  sodium chloride 0.9 % bolus 1,000 mL (1,000 mLs Intravenous New Bag/Given 09/30/16 1636)     Initial Impression / Assessment and Plan / ED Course  I have reviewed the triage vital signs and the nursing notes.  Pertinent labs &  imaging results that were available during my care of the patient were reviewed by me and considered in my medical decision making (see chart for details).  Clinical Course    [name] is a 50 y.o. female with a past medical history significant for diabetes, peripheral vascular disease s/p arterial bypass grafting in her right arm and right leg, asthma, hyperlipidemia, right eye blindness, and Depression who presents with chills, malaise, hyperglycemia, back pain, new ulcer, and right-sided numbness/weakness.   History and exam are seen above.  On exam, patient has large ulcer on right gluteus muscle area. There is surrounding erythema, tenderness, that it appears to be tracking towards the perineal area. Patient has no abdominal tenderness. Patient has no chest tenderness. Patient's lungs are clear. Patient has mild right upper extremity and right lower extremity weakness compared to left. Patient reports right' numbness. No facial droop scene. Mild right ptosis. Extraocular movements intact. Normal sensation on face.  Shortly after patient was rolled and infection was found, sepsis were set was initiated. Given the erythema and tracking, clinical concern for deep infection of the low back/groin. Fluids were ordered, cultures were obtained, and broad-spectrum antibiotics ordered. CT scan of the head was ordered to look for acute hemorrhage or stroke and imaging of abdomen and pelvis was ordered to look for abscess or worsened infection.   Patient was found to have significant hyperglycemia greater than 800. Patient was started on fluids while laboratory testing was in process. Did not initiate insulin drip due to patients hypokalemia. Potassium was supplemented via IV.   Laboratory testing showed lactic acidosis and leukocytosis. Chest x-ray did not show pneumonia. CT head showed normal non- contrasted scan of the brain. Abnormality seen in patients right eye where she has  has blindness.  CT  scan showed evidence of necrotizing fasciitis. General surgery was quickly called. General surgery evaluate the patient and will take her to the operating room. They requested critical-care consultation for further management. Critical-care was called in evaluate the patient. Critical-care placed orders for further glucose electrolyte management in the setting of infection.  Suspect infection caused hyperglycemia, leading to neurologic deficits. Suspect patient will need MRI of the head to rule out stroke worsened right-sided deficits. Feel patient needs to go to operating room more emergently then MRI.  Patient continue on fluids and will be taken to the operating room for surgical debridement of infection. Patient will then go to ICU for further management. Family was at the bedside during interactions and agreed with the plan of care.   Final Clinical Impressions(s) / ED Diagnoses   Final  diagnoses:  Necrotizing fasciitis ([place])  Hyperglycemia     Clinical Impression: 1. Necrotizing fasciitis ([place])   2. Hyperglycemia     Disposition: Admit to I see you after operating room    Courtney Paris, MD 09/30/16 2046

## 2016-09-30 NOTE — ED Notes (Signed)
Dr. Sherry Ruffing made aware of new onset of right arm weakness and right eye droop that patient reports was present upon waking this morning.

## 2016-09-30 NOTE — ED Notes (Signed)
Lab called to state that the pt's glucose was 602.

## 2016-09-30 NOTE — Consult Note (Addendum)
[facility] Surgery Consult Note  [name] 12-Jan-1966  [id number].    Requesting MD: Tegeler Chief Complaint/Reason for Consult: Necrotizing fasciitis  HPI:  [name] is a 50yo female who presented to [facility] earlier today complaining of RUE/[name] weakness and numbness and tingling. Concerned that she was having a stroke, her husband brought her to the ED. She also reports noticing an ulcer on her right buttock about 1 month ago; she put some ointment on it and it resolved, but then about 1 week ago it returned and got worse. She reports severe pain in the area as well as erythema and drainage. She also reports associated nausea, fevers, chills, and diarrhea.  Last meal was soup last night  ED workup: - CT shows extensive soft tissue gas in the subcutaneous soft tissues and gluteal muscles of the right lower buttock. Extensive soft tissue gas extends into the right ischiorectal fat and right hip adductor muscles. No focal fluid collections. - WBC 27.4 - lactic acid 3.00 - CBG >800 - Cr 1.06 - blood cultures, UA, and urine culture pending  PMH significant for extensive PAD, DM, neuropathy, HLD, asthma, depression, chronic back pain (on norco 10-325 BID and lyrica at home)  Anticoagulants: 79m ASA daily at home Smokes >1 PPD Employment: none  ROS: All systems reviewed and otherwise negative except for as above  Family History  Problem Relation Age of Onset  . Coronary artery disease Mother   . Peripheral vascular disease Mother   . Heart disease Mother     Before age 52 . Other Mother     Venous insuffiency  . Diabetes Mother   . Hyperlipidemia Mother   . Hypertension Mother   . Varicose Veins Mother   . Heart attack Mother     before age 52 . Heart disease Father   . Diabetes Father   . Diabetes Maternal Grandmother   . Diabetes Paternal Grandmother   . Diabetes Paternal Grandfather   . Diabetes Sister   . Hypertension Sister   . Diabetes Brother   .  Hypertension Brother     Past Medical History:  Diagnosis Date  . Arthritis   . Asthma   . Chronic back pain   . Depression   . Diabetes mellitus   . Hyperlipidemia   . Joint pain   . Leg pain    With Walking  . PAD (peripheral artery disease) ([place]   . Peripheral arterial disease ([place]   . Reflux   . Ulcer ([place]    Foot    Past Surgical History:  Procedure Laterality Date  . ABDOMINAL AORTAGRAM  June 15, 2014  . ABDOMINAL AORTAGRAM N/A 06/15/2014   Procedure: ABDOMINAL AMaxcine Ham  Surgeon: VSerafina Mitchell MD;  [facility];  Service: Cardiovascular;  Laterality: N/A;  . ABDOMINAL AORTAGRAM N/A 11/22/2014   Procedure: ABDOMINAL AORTAGRAM;  Surgeon: VSerafina Mitchell MD;  [facility];  Service: Cardiovascular;  Laterality: N/A;  . ARTERIAL BYPASS SURGRY  07/05/2010   Right Common Femoral to below knee popliteal BPG  . BACK SURGERY     X's  2  . CARDIAC CATHETERIZATION    . CHOLECYSTECTOMY     Gall Bladder  . CYSTECTOMY Right    foot  . CYSTECTOMY Left    wrist  . INTERCOSTAL NERVE BLOCK  November 2015  . left foot surgery    . left wrist cyst removal Left   . PERIPHERAL VASCULAR CATHETERIZATION  N/A 05/07/2016   Procedure: Abdominal Aortogram;  Surgeon: Serafina Mitchell, MD;  Location: [place] CV LAB;  Service: Cardiovascular;  Laterality: N/A;  . PERIPHERAL VASCULAR CATHETERIZATION N/A 05/07/2016   Procedure: Lower Extremity Angiography;  Surgeon: Serafina Mitchell, MD;  Location: [place] CV LAB;  Service: Cardiovascular;  Laterality: N/A;  . PERIPHERAL VASCULAR CATHETERIZATION N/A 05/07/2016   Procedure: Aortic Arch Angiography;  Surgeon: Serafina Mitchell, MD;  Location: [place] CV LAB;  Service: Cardiovascular;  Laterality: N/A;  . PERIPHERAL VASCULAR CATHETERIZATION N/A 05/07/2016   Procedure: Upper Extremity Angiography;  Surgeon: Serafina Mitchell, MD;  Location: [place] CV LAB;  Service: Cardiovascular;  Laterality: N/A;  . PERIPHERAL  VASCULAR CATHETERIZATION Right 05/07/2016   Procedure: Peripheral Vascular Balloon Angioplasty;  Surgeon: Serafina Mitchell, MD;  Location: [place] CV LAB;  Service: Cardiovascular;  Laterality: Right;  subclavian  . PERIPHERAL VASCULAR CATHETERIZATION Right 05/07/2016   Procedure: Peripheral Vascular Intervention;  Surgeon: Serafina Mitchell, MD;  Location: [place] CV LAB;  Service: Cardiovascular;  Laterality: Right;  External  Iliac  . SKIN GRAFT Right 2012   [name] by Dr. Nils Pyle- Right and Left Ankle  . SPINE SURGERY    . TONSILLECTOMY      Social History:  reports that she quit smoking about 5 months ago. Her smoking use included Cigarettes. She has a 6.00 pack-year smoking history. She has never used smokeless tobacco. She reports that she does not drink alcohol or use drugs.  Allergies:  Allergies  Allergen Reactions  . Penicillins Other (See Comments)    Severe Headache     (Not in a hospital admission)  Blood pressure (!) 91/54, pulse 113, temperature 98.6 F (37 C), temperature source Oral, resp. rate 17, height _0  (1.702 m), weight 151 lb (68.5 kg), SpO2 93 %. Physical Exam: General: chronically ill appearing white female who is laying in bed in NAD HEENT: head is normocephalic, atraumatic.  Sclera are noninjected.  PERRL on the L.  Ears and nose without any masses or lesions.  Mouth is dry Heart: regular rhythm, slightly tachy.  No obvious murmurs, gallops, or rubs noted.  Palpable pedal pulses bilaterally Lungs: CTAB, no wheezes, rhonchi, or rales noted.  Respiratory effort nonlabored Abd: soft, NT/ND, +BS, no masses, hernias, or organomegaly MS: all 4 extremities are symmetrical with no cyanosis, clubbing, or edema. Chronic wound right ankle Psych: A&Ox3 with an appropriate affect. Neuro: CM 2-12 intact, extremity CSM intact bilaterally, normal speech Skin:     Results for orders placed or performed during the hospital encounter of 09/30/16 (from the past 48  hour(s))  CBG monitoring, ED     Status: Abnormal   Collection Time: 09/30/16 12:13 PM  Result Value Ref Range   Glucose-Capillary >600 (HH) 65 - 99 mg/dL  Basic metabolic panel     Status: Abnormal   Collection Time: 09/30/16 12:26 PM  Result Value Ref Range   Sodium 124 (L) 135 - 145 mmol/L   Potassium 3.4 (L) 3.5 - 5.1 mmol/L   Chloride 85 (L) 101 - 111 mmol/L   CO2 22 22 - 32 mmol/L   Glucose, Bld 808 (HH) 65 - 99 mg/dL    Comment: CRITICAL RESULT CALLED TO, READ BACK BY AND VERIFIED WITH: H.BOWMAN,RN 1313 09/30/16 CLARK,S    BUN 12 6 - 20 mg/dL   Creatinine, Ser 1.06 (H) 0.44 - 1.00 mg/dL   Calcium 8.5 (L) 8.9 - 10.3 mg/dL   GFR  calc non Af Amer 59 (L) >60 mL/min   GFR calc Af Amer >60 >60 mL/min    Comment: (NOTE) The eGFR has been calculated using the CKD EPI equation. This calculation has not been validated in all clinical situations. eGFR's persistently <60 mL/min signify possible Chronic Kidney Disease.    Anion gap 17 (H) 5 - 15  CBC     Status: Abnormal   Collection Time: 09/30/16 12:26 PM  Result Value Ref Range   WBC 27.4 (H) 4.0 - 10.5 K/uL   RBC 5.32 (H) 3.87 - 5.11 MIL/uL   Hemoglobin 15.1 (H) 12.0 - 15.0 g/dL   HCT 43.3 36.0 - 46.0 %   MCV 81.4 78.0 - 100.0 fL   MCH 28.4 26.0 - 34.0 pg   MCHC 34.9 30.0 - 36.0 g/dL   RDW 13.7 11.5 - 15.5 %   Platelets 233 150 - 400 K/uL  Differential     Status: Abnormal   Collection Time: 09/30/16 12:26 PM  Result Value Ref Range   Neutrophils Relative % 89 %   Lymphocytes Relative 5 %   Monocytes Relative 6 %   Eosinophils Relative 0 %   Basophils Relative 0 %   Neutro Abs 24.4 (H) 1.7 - 7.7 K/uL   Lymphs Abs 1.4 0.7 - 4.0 K/uL   Monocytes Absolute 1.6 (H) 0.1 - 1.0 K/uL   Eosinophils Absolute 0.0 0.0 - 0.7 K/uL   Basophils Absolute 0.0 0.0 - 0.1 K/uL   Smear Review MORPHOLOGY UNREMARKABLE   I-Stat CG4 Lactic Acid, ED  (not at  [facility])     Status: Abnormal   Collection Time: 09/30/16  1:51 PM  Result Value Ref  Range   Lactic Acid, Venous 3.00 (HH) 0.5 - 1.9 mmol/L   Comment NOTIFIED PHYSICIAN   Hepatic function panel     Status: Abnormal   Collection Time: 09/30/16  2:00 PM  Result Value Ref Range   Total Protein 6.9 6.5 - 8.1 g/dL   Albumin 2.4 (L) 3.5 - 5.0 g/dL   AST 46 (H) 15 - 41 U/L   ALT 14 14 - 54 U/L   Alkaline Phosphatase 112 38 - 126 U/L   Total Bilirubin 0.9 0.3 - 1.2 mg/dL   Bilirubin, Direct 0.3 0.1 - 0.5 mg/dL   Indirect Bilirubin 0.6 0.3 - 0.9 mg/dL  Lipase, blood     Status: None   Collection Time: 09/30/16  2:26 PM  Result Value Ref Range   Lipase 18 11 - 51 U/L  Protime-INR     Status: Abnormal   Collection Time: 09/30/16  2:26 PM  Result Value Ref Range   Prothrombin Time 17.9 (H) 11.4 - 15.2 seconds   INR 1.46    Dg Chest 2 View  Result Date: 09/30/2016 CLINICAL DATA:  RIGHT-sided weakness, chills, subjective fever. RIGHT gluteal infection and severe pain. Fall 2 months ago. History of diabetes. EXAM: CHEST  2 VIEW COMPARISON:  Chest radiograph June 27, 2011 FINDINGS: Cardiomediastinal silhouette is normal. Mild bronchitic changes. No pleural effusions or focal consolidations. Trachea projects midline and there is no pneumothorax. Soft tissue planes and included osseous structures are non-suspicious. Surgical clips in the included right abdomen compatible with cholecystectomy. IMPRESSION: Mild bronchitic changes. Electronically Signed   By: Elon Alas M.D.   On: 09/30/2016 14:20   Ct Head Wo Contrast  Result Date: 09/30/2016 CLINICAL DATA:  50 year old female with right side weakness, right arm numbness, "right eye droop". Initial encounter. EXAM: CT  HEAD WITHOUT CONTRAST TECHNIQUE: Contiguous axial images were obtained from the base of the skull through the vertex without intravenous contrast. COMPARISON:  Face CT without contrast 02/14/2011. Brain MRI 09/14/2007. FINDINGS: Brain: No midline shift, ventriculomegaly, mass effect, evidence of mass lesion,  intracranial hemorrhage or evidence of cortically based acute infarction. Gray-white matter differentiation is within normal limits throughout the brain. Vascular: Calcified atherosclerosis at the skull base. No suspicious intracranial vascular hyperdensity. Skull: No osseous abnormality identified. Sinuses/Orbits: Mild bubbly opacity and mucosal thickening in the posterior right ethmoid air cell. Other Visualized paranasal sinuses and mastoids are stable and well pneumatized. Other: Abnormal right globe with posterior hyperdensity with a v-shaped extension toward the lens (series 2, image 5). The right globe was normal on prior studies. The other visible bilateral orbits soft tissues are normal. Visualized scalp soft tissues are within normal limits. IMPRESSION: 1. Abnormal right globe, new since 2012. Top differential considerations include right retinal tumor, retinal tear, or other vitreous hemorrhage. The appearance is not typical of retinal or choroidal detachment, or post treatment changes to the globe. 2.  Normal noncontrast CT appearance of the brain. 3. Mild acute inflammation of the posterior right ethmoid air cell. Electronically Signed   By: Genevie Ann M.D.   On: 09/30/2016 15:27   Ct Abdomen Pelvis W Contrast  Result Date: 09/30/2016 CLINICAL DATA:  50 year old female with right sided extremity weakness, chills, subjective fevers and right gluteus infection with severe pain. EXAM: CT ABDOMEN AND PELVIS WITH CONTRAST TECHNIQUE: Multidetector CT imaging of the abdomen and pelvis was performed using the standard protocol following bolus administration of intravenous contrast. CONTRAST:  148m ISOVUE-300 IOPAMIDOL (ISOVUE-300) INJECTION 61% COMPARISON:  03/01/2008 CT angiogram of the abdomen and pelvis. FINDINGS: Lower chest: Hypoventilatory changes in the dependent lung bases. Coronary atherosclerosis. Hepatobiliary: Normal liver size. Hypodense subcapsular 1.2 cm liver lesion in the far inferior right  liver lobe (series 2/ image 41), new since 03/01/2008. No additional liver lesions. Cholecystectomy. No biliary ductal dilatation. Pancreas: Normal, with no mass or duct dilation. Spleen: Normal size spleen. There are 5 hypodense lesions scattered throughout the spleen, largest 1.4 cm, not easily compared to the 03/01/2008 CT study, which was performed at a different phase of contrast. Adrenals/Urinary Tract: Normal adrenals. There is symmetric fullness of the central renal collecting systems without overt hydronephrosis. No renal mass. Distended bladder without bladder wall thickening. Stomach/Bowel: Grossly normal stomach. Normal caliber small bowel with no small bowel wall thickening. Normal appendix. Normal large bowel with no diverticulosis, large bowel wall thickening or pericolonic fat stranding. Vascular/Lymphatic: Atherosclerotic nonaneurysmal abdominal aorta. Right external iliac artery and right common femoral artery stents are in place. Apparent occlusion of the right common iliac artery. Patent portal, hepatic, splenic and renal veins. No pathologically enlarged lymph nodes in the abdomen or pelvis. Reproductive: Grossly normal uterus.  No adnexal mass. Other: No pneumoperitoneum, ascites or focal fluid collection. There is extensive soft tissue gas in the subcutaneous and gluteal muscle tissues of the right lower buttock. There is extensive soft tissue gas extending into the right ischiorectal fat and right hip adductor muscles. No focal fluid collections. Musculoskeletal: No aggressive appearing focal osseous lesions. Status post bilateral posterior spinal fusion at L5-S1 with bone cages in the L4-5 and L5-S1 disc spaces. Healed deformity in the medial right iliac bone from bone graft harvest defect. Mild thoracolumbar spondylosis. IMPRESSION: 1. Extensive soft tissue gas in the subcutaneous soft tissues and gluteal muscles of the right lower buttock. Extensive soft  tissue gas extends into the right  ischiorectal fat and right hip adductor muscles. No focal fluid collections. Please note that the inferior extent of this process is not demonstrated on this CT study. These findings are worrisome for necrotizing fasciitis and urgent surgical consultation is advised. 2. 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 3. Symmetric fullness of the renal collecting systems without overt hydronephrosis, probably due to bladder distention. Correlate clinically to exclude bladder outlet obstruction. 4. **An incidental finding of potential clinical significance has been found. New subcapsular low-attenuation inferior right liver lobe lesion, indeterminate. Several small low-attenuation splenic lesions, not definitely seen on 2009 CT, indeterminate. MRI of the abdomen without and with IV contrast is recommended on a short term outpatient basis. ** 5. Coronary atherosclerosis. These results were called by telephone at the time of interpretation on 09/30/2016 at 3:45 pm to Dr. Marda Stalker , who verbally acknowledged these results. Electronically Signed   By: Ilona Sorrel M.D.   On: 09/30/2016 15:46      Assessment/Plan Concern for necrotizing fasciitis, right buttock - CT shows extensive soft tissue gas in the subcutaneous soft tissues and gluteal muscles of the right lower buttock. Extensive soft tissue gas extends into the right ischiorectal fat and right hip adductor muscles. No focal fluid collections. - WBC 27.4 - lactic acid 3.00 - CBG >800 - Cr 1.06 - blood cultures, UA, and urine culture pending  Extensive PAD DM Neuropathy HLD Asthma Depression Chronic back pain (on norco 10-325 BID and lyrica at home)  Tobacco abuse  Plan - recommend CCM admit (due to possibility having to stay on vent postop). NPO, IV antibiotics (vancomycin, cefepime), resuscitation, and plan for OR later today for debridement.  Jerrye Beavers, PA-C [facility] 09/30/2016, 3:54 PM Pager: [phone] Consults: [phone] Mon-Fri 7:00 am-4:30 pm Sat-Sun 7:00 am-11:30 am  Patient examined and I agree with the assessment and plan Necrotizing soft tissue infection involving right buttock subcutaneous tissues and likely musculature. Appreciate CCM evaluation and admission. I recommend emergent, aggressive debridement of this infection for source control. I explained this to her and her husband including the fact that this is a life threatening infection and may require multiple debridements. She will need to go to the intensive care unit afterwards and may need to stay on the ventilator for a period of time. I marked her site. She has received vancomycin and cefepime. We will get operative cultures as well. I discussed the procedure, risks, and benefits with her as well as the expected postoperative course. She is agreeable.  Additionally, her CT scan demonstrates significant iliac atherosclerotic disease. She may need further evaluation of this by vascular surgery during this admission.  Georganna Skeans, MD, MPH, FACS Trauma: [phone] General Surgery: [phone]  09/30/2016 5:50 PM

## 2016-09-30 NOTE — Transfer of Care (Signed)
Immediate Anesthesia Transfer of Care Note  Patient: [name]  Procedure(s) Performed: Procedure(s): DEBRIDEMENT RIGHT  BUTTOCK WOUND (Right)  Patient Location: ICU  Anesthesia Type:General  Level of Consciousness: Patient remains intubated per anesthesia plan  Airway & Oxygen Therapy: Patient remains intubated per anesthesia plan and Patient placed on Ventilator (see vital sign flow sheet for setting)  Post-op Assessment: Report given to RN and Post -op Vital signs reviewed and stable  Post vital signs: Reviewed and stable  Last Vitals:  Vitals:   09/30/16 1645 09/30/16 1715  BP: 107/71 108/74  Pulse: 112 112  Resp: 13 21  Temp:      Last Pain:  Vitals:   09/30/16 1540  TempSrc:   PainSc: 9          Complications: No apparent anesthesia complications

## 2016-09-30 NOTE — Consult Note (Addendum)
PULMONARY / CRITICAL CARE MEDICINE   Name: [name] MRN: [id number] DOB: 1966/07/01    ADMISSION DATE:  09/30/2016 CONSULTATION DATE:  11/13  REFERRING MD:  Dr. Grandville Silos CCS  CHIEF COMPLAINT:  Necrotizing Fasciitis   HISTORY OF PRESENT ILLNESS:   50 year old female with past medical history as below, which is significant for asthma, diabetes, peripheral artery disease, and GERD. Current issue started about 1 month ago when she noted an ulcer on her right buttock which she treated at home with some ointment and apparently resolved. This is again got worse over the past week. Then 11/13 she presented to [facility] emergency department with complaints of right-sided weakness and paresthesia. She also is complaining of severe pain in the area of that ulceration on the right buttock. The area was noted to be red and with drainage. She was taken for a CT of the area which demonstrated  Gas in the soft tissue and muscle. She was also hyperglycemic in the emergency department with a glucose of 800. She was taken emergently to the operating room for necrotizing fasciitis. PCCM asked to assist with medical management  PAST MEDICAL HISTORY :  She  has a past medical history of Arthritis; Asthma; Chronic back pain; Depression; Diabetes mellitus; Hyperlipidemia; Joint pain; Leg pain; PAD (peripheral artery disease) ([place]); Peripheral arterial disease ([place]); Reflux; and Ulcer ([place]).  PAST SURGICAL HISTORY: She  has a past surgical history that includes Arterial bypass surgry (07/05/2010); Skin graft (Right, 2012); Back surgery; Tonsillectomy; Spine surgery; Cholecystectomy; Abdominal aortagram (June 15, 2014); Intercostal nerve block (November 2015); abdominal aortagram (N/A, 06/15/2014); abdominal aortagram (N/A, 11/22/2014); Cystectomy (Right); Cystectomy (Left); left foot surgery; left wrist cyst removal (Left); Cardiac catheterization (N/A, 05/07/2016); Cardiac catheterization (N/A, 05/07/2016); Cardiac  catheterization (N/A, 05/07/2016); Cardiac catheterization (N/A, 05/07/2016); Cardiac catheterization (Right, 05/07/2016); Cardiac catheterization (Right, 05/07/2016); and Cardiac catheterization.  Allergies  Allergen Reactions  . Penicillins Other (See Comments)    Severe Headache    Current Facility-Administered Medications on File Prior to Encounter  Medication  . 0.9 %  sodium chloride infusion   Current Outpatient Prescriptions on File Prior to Encounter  Medication Sig  . albuterol (PROVENTIL HFA;VENTOLIN HFA) 108 (90 Base) MCG/ACT inhaler Inhale 2 puffs into the lungs as needed for wheezing or shortness of breath. Reported on 02/28/2016  . albuterol (PROVENTIL) 2 MG tablet Take 2 mg by mouth 3 (three) times daily as needed for shortness of breath.   Marland Kitchen aspirin 81 MG tablet Take 81 mg by mouth daily as needed for pain.   . cetirizine (ZYRTEC) 10 MG tablet Take 10 mg by mouth daily as needed for allergies. Reported on 03/27/2016  . Choline Fenofibrate (FENOFIBRIC ACID) 45 MG CPDR Take 45 mg by mouth daily. Reported on 03/18/2016  . cyclobenzaprine (FLEXERIL) 10 MG tablet Limit 1 tablet by mouth 1  to  3 times per day if tolerated (Patient taking differently: Take 10 mg by mouth 3 (three) times daily as needed for muscle spasms. )  . furosemide (LASIX) 40 MG tablet Take 40 mg by mouth 2 (two) times daily as needed for fluid.   Marland Kitchen HUMALOG KWIKPEN 100 UNIT/ML KiwkPen Inject 10 Units into the skin 3 (three) times daily. Reported on 04/08/2016  . HYDROcodone-acetaminophen (NORCO) 10-325 MG tablet Limit 1 tablet by mouth per day or twice per day if tolerated  . LANTUS SOLOSTAR 100 UNIT/ML Solostar Pen Inject 60 Units into the skin at bedtime.   Marland Kitchen loperamide (IMODIUM A-D) 2  MG tablet Take 2 mg by mouth as needed for diarrhea or loose stools.  . meclizine (ANTIVERT) 25 MG tablet Take 25 mg by mouth 3 (three) times daily as needed for dizziness.  . naproxen sodium (ANAPROX) 220 MG tablet Take 220 mg by  mouth 2 (two) times daily as needed (pain). Reported on 03/18/2016  . omeprazole (PRILOSEC) 20 MG capsule Take 20 mg by mouth daily as needed (heartburn).   . pregabalin (LYRICA) 100 MG capsule Limit 1 tab by mouth twice a day to 3 times a day if tolerated (Patient taking differently: Take 100 mg by mouth 2 (two) times daily. )  . pseudoephedrine-guaifenesin (MUCINEX D) 60-600 MG per tablet Take 1 tablet by mouth every 12 (twelve) hours as needed for congestion. Reported on 04/08/2016  . pregabalin (LYRICA) 100 MG capsule Limit 1 tab by mouth twice a day to 3 times a day if tolerated (Patient not taking: Reported on 09/30/2016)    FAMILY HISTORY:  Her indicated that her mother is deceased. She indicated that her father is deceased. She indicated that the status of her sister is unknown. She indicated that the status of her brother is unknown. She indicated that her maternal grandmother is deceased. She indicated that her paternal grandmother is deceased. She indicated that her paternal grandfather is deceased.    SOCIAL HISTORY: She  reports that she quit smoking about 5 months ago. Her smoking use included Cigarettes. She has a 6.00 pack-year smoking history. She has never used smokeless tobacco. She reports that she does not drink alcohol or use drugs.  REVIEW OF SYSTEMS:   Review of Systems  Constitutional: Positive for chills, fever, malaise/fatigue and weight loss. Negative for diaphoresis.  HENT: Negative for congestion, ear discharge, ear pain, hearing loss, nosebleeds and tinnitus.   Eyes: Negative for blurred vision, double vision, photophobia and pain.  Respiratory: Negative for cough, hemoptysis, sputum production and shortness of breath.   Cardiovascular: Negative for chest pain, palpitations, orthopnea, claudication, leg swelling and PND.  Gastrointestinal: Negative for constipation, diarrhea, heartburn and vomiting.  Genitourinary: Negative for dysuria and urgency.  Musculoskeletal:  Positive for myalgias. Negative for back pain and neck pain.  Skin: Negative for itching.  Neurological: Positive for weakness. Negative for dizziness, tingling and headaches.  Endo/Heme/Allergies: Negative for environmental allergies. Does not bruise/bleed easily.  Psychiatric/Behavioral: Negative for depression.     SUBJECTIVE:    VITAL SIGNS: BP 108/74   Pulse 112   Temp 98.6 F (37 C) (Oral)   Resp 21   Ht 5\' 7"  (1.702 m)   Wt 68.5 kg (151 lb)   SpO2 91%   BMI 23.65 kg/m   HEMODYNAMICS:    VENTILATOR SETTINGS:    INTAKE / OUTPUT: No intake/output data recorded.  PHYSICAL EXAMINATION: General:  Awake in bed Neuro:  Nonfocal, alert, int anxiety HEENT:  Low jvd Cardiovascular:  s1 s RR mild tachy Lungs:  CTA Abdomen:  Soft, bs wnl, no r/g Musculoskeletal:  Left buttock annular ulceration with frank thick pus draining and warmth and tender Skin:  No rash besdies the eyrthema around wound  LABS:  BMET  Recent Labs Lab 09/30/16 1226 09/30/16 1549 09/30/16 1743  NA 124* 129* 130*  K 3.4* 2.9* 3.2*  CL 85* 90* 94*  CO2 22 27 22   BUN 12 10 11   CREATININE 1.06* 0.83 0.72  GLUCOSE 808* 602* 547*    Electrolytes  Recent Labs Lab 09/30/16 1226 09/30/16 1549 09/30/16 1743  CALCIUM 8.5*  8.3* 8.5*  MG  --   --  1.3*    CBC  Recent Labs Lab 09/30/16 1226  WBC 27.4*  HGB 15.1*  HCT 43.3  PLT 233    Coag's  Recent Labs Lab 09/30/16 1426  INR 1.46    Sepsis Markers  Recent Labs Lab 09/30/16 1351 09/30/16 1556  LATICACIDVEN 3.00* 2.25*    ABG No results for input(s): PHART, PCO2ART, PO2ART in the last 168 hours.  Liver Enzymes  Recent Labs Lab 09/30/16 1400  AST 46*  ALT 14  ALKPHOS 112  BILITOT 0.9  ALBUMIN 2.4*    Cardiac Enzymes  Recent Labs Lab 09/30/16 1743  TROPONINI 0.09*    Glucose  Recent Labs Lab 09/30/16 1213 09/30/16 1729  GLUCAP >600* 549*    Imaging Dg Chest 2 View  Result Date:  09/30/2016 CLINICAL DATA:  RIGHT-sided weakness, chills, subjective fever. RIGHT gluteal infection and severe pain. Fall 2 months ago. History of diabetes. EXAM: CHEST  2 VIEW COMPARISON:  Chest radiograph June 27, 2011 FINDINGS: Cardiomediastinal silhouette is normal. Mild bronchitic changes. No pleural effusions or focal consolidations. Trachea projects midline and there is no pneumothorax. Soft tissue planes and included osseous structures are non-suspicious. Surgical clips in the included right abdomen compatible with cholecystectomy. IMPRESSION: Mild bronchitic changes. Electronically Signed   By: Elon Alas M.D.   On: 09/30/2016 14:20   Ct Head Wo Contrast  Result Date: 09/30/2016 CLINICAL DATA:  50 year old female with right side weakness, right arm numbness, "right eye droop". Initial encounter. EXAM: CT HEAD WITHOUT CONTRAST TECHNIQUE: Contiguous axial images were obtained from the base of the skull through the vertex without intravenous contrast. COMPARISON:  Face CT without contrast 02/14/2011. Brain MRI 09/14/2007. FINDINGS: Brain: No midline shift, ventriculomegaly, mass effect, evidence of mass lesion, intracranial hemorrhage or evidence of cortically based acute infarction. Gray-white matter differentiation is within normal limits throughout the brain. Vascular: Calcified atherosclerosis at the skull base. No suspicious intracranial vascular hyperdensity. Skull: No osseous abnormality identified. Sinuses/Orbits: Mild bubbly opacity and mucosal thickening in the posterior right ethmoid air cell. Other Visualized paranasal sinuses and mastoids are stable and well pneumatized. Other: Abnormal right globe with posterior hyperdensity with a v-shaped extension toward the lens (series 2, image 5). The right globe was normal on prior studies. The other visible bilateral orbits soft tissues are normal. Visualized scalp soft tissues are within normal limits. IMPRESSION: 1. Abnormal right globe,  new since 2012. Top differential considerations include right retinal tumor, retinal tear, or other vitreous hemorrhage. The appearance is not typical of retinal or choroidal detachment, or post treatment changes to the globe. 2.  Normal noncontrast CT appearance of the brain. 3. Mild acute inflammation of the posterior right ethmoid air cell. Electronically Signed   By: Genevie Ann M.D.   On: 09/30/2016 15:27   Ct Abdomen Pelvis W Contrast  Result Date: 09/30/2016 CLINICAL DATA:  50 year old female with right sided extremity weakness, chills, subjective fevers and right gluteus infection with severe pain. EXAM: CT ABDOMEN AND PELVIS WITH CONTRAST TECHNIQUE: Multidetector CT imaging of the abdomen and pelvis was performed using the standard protocol following bolus administration of intravenous contrast. CONTRAST:  145mL ISOVUE-300 IOPAMIDOL (ISOVUE-300) INJECTION 61% COMPARISON:  03/01/2008 CT angiogram of the abdomen and pelvis. FINDINGS: Lower chest: Hypoventilatory changes in the dependent lung bases. Coronary atherosclerosis. Hepatobiliary: Normal liver size. Hypodense subcapsular 1.2 cm liver lesion in the far inferior right liver lobe (series 2/ image 41), new since 03/01/2008.  No additional liver lesions. Cholecystectomy. No biliary ductal dilatation. Pancreas: Normal, with no mass or duct dilation. Spleen: Normal size spleen. There are 5 hypodense lesions scattered throughout the spleen, largest 1.4 cm, not easily compared to the 03/01/2008 CT study, which was performed at a different phase of contrast. Adrenals/Urinary Tract: Normal adrenals. There is symmetric fullness of the central renal collecting systems without overt hydronephrosis. No renal mass. Distended bladder without bladder wall thickening. Stomach/Bowel: Grossly normal stomach. Normal caliber small bowel with no small bowel wall thickening. Normal appendix. Normal large bowel with no diverticulosis, large bowel wall thickening or pericolonic  fat stranding. Vascular/Lymphatic: Atherosclerotic nonaneurysmal abdominal aorta. Right external iliac artery and right common femoral artery stents are in place. Apparent occlusion of the right common iliac artery. Patent portal, hepatic, splenic and renal veins. No pathologically enlarged lymph nodes in the abdomen or pelvis. Reproductive: Grossly normal uterus.  No adnexal mass. Other: No pneumoperitoneum, ascites or focal fluid collection. There is extensive soft tissue gas in the subcutaneous and gluteal muscle tissues of the right lower buttock. There is extensive soft tissue gas extending into the right ischiorectal fat and right hip adductor muscles. No focal fluid collections. Musculoskeletal: No aggressive appearing focal osseous lesions. Status post bilateral posterior spinal fusion at L5-S1 with bone cages in the L4-5 and L5-S1 disc spaces. Healed deformity in the medial right iliac bone from bone graft harvest defect. Mild thoracolumbar spondylosis. IMPRESSION: 1. Extensive soft tissue gas in the subcutaneous soft tissues and gluteal muscles of the right lower buttock. Extensive soft tissue gas extends into the right ischiorectal fat and right hip adductor muscles. No focal fluid collections. Please note that the inferior extent of this process is not demonstrated on this CT study. These findings are worrisome for necrotizing fasciitis and urgent surgical consultation is advised. 2. 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 3. Symmetric fullness of the renal collecting systems without overt hydronephrosis, probably due to bladder distention. Correlate clinically to exclude bladder outlet obstruction. 4. **An incidental finding of potential clinical significance has been found. New subcapsular low-attenuation inferior right liver lobe lesion, indeterminate. Several small low-attenuation splenic lesions, not definitely seen on 2009 CT,  indeterminate. MRI of the abdomen without and with IV contrast is recommended on a short term outpatient basis. ** 5. Coronary atherosclerosis. These results were called by telephone at the time of interpretation on 09/30/2016 at 3:45 pm to Dr. Marda Stalker , who verbally acknowledged these results. Electronically Signed   By: Ilona Sorrel M.D.   On: 09/30/2016 15:46     STUDIES:  CT abd/pelvis 11/13 > Extensive soft tissue gas in the subcutaneous soft tissues and gluteal muscles of the right lower buttock. Extensive soft tissue gas extends into the right ischiorectal fat and right hip adductor muscles. No focal fluid collections. Please note that the inferior extent of this process is not demonstrated on this CT study. These findings are worrisome for necrotizing fasciitis and urgent surgical consultation is advised. 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 3. Symmetric fullness of the renal collecting systems without overt hydronephrosis, probably due to bladder distention. Correlate clinically to exclude bladder outlet obstruction.  incidental finding of potential clinical significance has been found. New subcapsular low-attenuation inferior right liver lobe lesion. Several small low-attenuation splenic lesions  Ct head 11/13 >Abnormal right globe, new since 2012. Top differential considerations include right retinal tumor, retinal tear, or other vitreous  hemorrhage. The appearance is not typical of retinal or choroidal detachment, or post treatment changes to the globe.  Normal noncontrast CT appearance of the brain. Mild acute inflammation of the posterior right ethmoid air cell.  CULTURES: Blood 11/13 > Urine 11/13 > Wound 11/13 >  ANTIBIOTICS: Cefepime 11/13 > Vancomycin 11/13 > clinda 11/13>>>  SIGNIFICANT EVENTS: 11/13- to OR nec fasc, sepsis  LINES/TUBES:   ASSESSMENT / PLAN:  PULMONARY A: No acute  issues  P:   Keep O2 sats > 90% PRN albuterol per home regimen  CARDIOVASCULAR A:  NSTEM - mild troponin elevation PAD HLD sepsis P:  Telemetry monitoring  Hold statin while NPO Trend troponin May need cvp Lactic down Pos balance  RENAL A:   Hypokalemia Hypomag Hyponatremia  P:   Replete mag K repleted in ED asap especially with inusunlin needs saline  GASTROINTESTINAL A:   No acute issues  P:   NPO Pepcid for SUP  HEMATOLOGIC A:   No acute issues  P:  Trend CBC  INFECTIOUS A:   Necrotizing fasciitis or R buttock  P:   To OR Cefepime and vancomycin as above  ENDOCRINE A:   HHNK   P:   Aggressive IVF resuscitation IVF resuscitation Check beta-hydroxybutiric acid Serial BMP  NEUROLOGIC A:   Stroke like symptoms - R sided weakness  P:   RASS goal: 0 Monitor post-op   FAMILY  - Updates: Family updated by DF.   - Inter-disciplinary family meet or Palliative Care meeting due by:  11/20   Georgann Housekeeper, AGACNP-BC [facility] Pager [phone] or [phone]  09/30/2016 7:19 PM  STAFF NOTE: Linwood Dibbles, MD FACP have personally reviewed patient's available data, including medical history, events of note, physical examination and test results as part of my evaluation. I have discussed with resident/NP and other care providers such as pharmacist, RN and RRT. In addition, I personally evaluated patient and elicited key findings of: awake , anxious, pain reported, left buttocks with ulceration 5 cm annular with pus thick draining and sorournding erythema and tenderness, sys 110 (baseline 80-90 per husband), hypokalemia and HONK ( NONAG), lactic acid is down and was less then 4, code sepsis called, lactic less 4 and BP wnl in ER, aggressive K supp as we start insulin drip to control glucose, saline , follow na, assess serum osm, urine na, osm, add clinda for toxin inhibition /.anearobic for now, on call to OR,  no role repeat lactic acid levels unless a clinical status change, pressors required at this stage but may need post op with SIRS response  I performed my repeat assessment sepsis 11/13 - time 4 pm  Lavon Paganini. Titus Mould, MD, [place] Pgr: [facility] 09/30/2016 9:00 PM

## 2016-09-30 NOTE — ED Notes (Signed)
Fall Risk band placed Rt arm.  Female urinal given to Pt

## 2016-09-30 NOTE — ED Notes (Signed)
Dr. Grandville Silos at bedside to assess patient.

## 2016-09-30 NOTE — ED Notes (Signed)
Consent signed at bedside.  °

## 2016-09-30 NOTE — ED Notes (Signed)
ED Provider at bedside. 

## 2016-09-30 NOTE — ED Notes (Signed)
Patient transported to CT 

## 2016-10-01 ENCOUNTER — Inpatient Hospital Stay (HOSPITAL_COMMUNITY): Payer: Medicare Other

## 2016-10-01 ENCOUNTER — Encounter (HOSPITAL_COMMUNITY): Payer: Self-pay | Admitting: General Surgery

## 2016-10-01 DIAGNOSIS — J9600 Acute respiratory failure, unspecified whether with hypoxia or hypercapnia: Secondary | ICD-10-CM

## 2016-10-01 DIAGNOSIS — L8990 Pressure ulcer of unspecified site, unspecified stage: Secondary | ICD-10-CM | POA: Diagnosis present

## 2016-10-01 LAB — BASIC METABOLIC PANEL
Anion gap: 8 (ref 5–15)
Anion gap: 8 (ref 5–15)
Anion gap: 9 (ref 5–15)
BUN: 6 mg/dL (ref 6–20)
BUN: 7 mg/dL (ref 6–20)
BUN: <5 mg/dL — ABNORMAL LOW (ref 6–20)
CALCIUM: 7.8 mg/dL — AB (ref 8.9–10.3)
CALCIUM: 7.6 mg/dL — AB (ref 8.9–10.3)
CALCIUM: 7.6 mg/dL — AB (ref 8.9–10.3)
CHLORIDE: 102 mmol/L (ref 101–111)
CO2: 23 mmol/L (ref 22–32)
CO2: 24 mmol/L (ref 22–32)
CO2: 25 mmol/L (ref 22–32)
CREATININE: 0.43 mg/dL — AB (ref 0.44–1.00)
CREATININE: 0.45 mg/dL (ref 0.44–1.00)
CREATININE: 0.53 mg/dL (ref 0.44–1.00)
Chloride: 105 mmol/L (ref 101–111)
Chloride: 108 mmol/L (ref 101–111)
GFR calc Af Amer: >60 mL/min (ref >60)
GFR calc non Af Amer: >60 mL/min (ref >60)
GFR calc non Af Amer: >60 mL/min (ref >60)
GLUCOSE: 159 mg/dL — AB (ref 65–99)
Glucose, Bld: 268 mg/dL — ABNORMAL HIGH (ref 65–99)
Glucose, Bld: 310 mg/dL — ABNORMAL HIGH (ref 65–99)
POTASSIUM: 2.8 mmol/L — AB (ref 3.5–5.1)
Potassium: 2.9 mmol/L — ABNORMAL LOW (ref 3.5–5.1)
Potassium: 3.6 mmol/L (ref 3.5–5.1)
SODIUM: 137 mmol/L (ref 135–145)
Sodium: 139 mmol/L (ref 135–145)
Sodium: 136 mmol/L (ref 135–145)

## 2016-10-01 LAB — CBC
HEMATOCRIT: 36.4 % (ref 36.0–46.0)
Hemoglobin: 12.5 g/dL (ref 12.0–15.0)
MCH: 27.9 pg (ref 26.0–34.0)
MCHC: 34.3 g/dL (ref 30.0–36.0)
MCV: 81.3 fL (ref 78.0–100.0)
Platelets: 340 10*3/uL (ref 150–400)
RBC: 4.48 MIL/uL (ref 3.87–5.11)
RDW: 13.9 % (ref 11.5–15.5)
WBC: 24.1 10*3/uL — AB (ref 4.0–10.5)

## 2016-10-01 LAB — GLUCOSE, CAPILLARY
GLUCOSE-CAPILLARY: 290 mg/dL — AB (ref 65–99)
GLUCOSE-CAPILLARY: 211 mg/dL — AB (ref 65–99)
GLUCOSE-CAPILLARY: 217 mg/dL — AB (ref 65–99)
GLUCOSE-CAPILLARY: 202 mg/dL — AB (ref 65–99)
GLUCOSE-CAPILLARY: 143 mg/dL — AB (ref 65–99)
Glucose-Capillary: 287 mg/dL — ABNORMAL HIGH (ref 65–99)
Glucose-Capillary: 253 mg/dL — ABNORMAL HIGH (ref 65–99)
Glucose-Capillary: 267 mg/dL — ABNORMAL HIGH (ref 65–99)

## 2016-10-01 LAB — LACTIC ACID, PLASMA: LACTIC ACID, VENOUS: 0.8 mmol/L (ref 0.5–1.9)

## 2016-10-01 LAB — URINE CULTURE

## 2016-10-01 MED ORDER — MAGNESIUM SULFATE 4 GM/100ML IV SOLN
4.0000 g | Freq: Once | INTRAVENOUS | Status: AC
  Administered 2016-10-01: 4 g via INTRAVENOUS
  Filled 2016-10-01: qty 100

## 2016-10-01 MED ORDER — SODIUM CHLORIDE 0.9 % IV SOLN
30.0000 meq | Freq: Once | INTRAVENOUS | Status: AC
  Administered 2016-10-02: 30 meq via INTRAVENOUS
  Filled 2016-10-01: qty 15

## 2016-10-01 MED ORDER — SODIUM CHLORIDE 0.9 % IV SOLN
30.0000 meq | Freq: Once | INTRAVENOUS | Status: AC
  Administered 2016-10-01: 30 meq via INTRAVENOUS
  Filled 2016-10-01: qty 15

## 2016-10-01 MED ORDER — POTASSIUM CHLORIDE 2 MEQ/ML IV SOLN
30.0000 meq | Freq: Once | INTRAVENOUS | Status: AC
  Administered 2016-10-01: 30 meq via INTRAVENOUS
  Filled 2016-10-01: qty 15

## 2016-10-01 MED ORDER — INSULIN GLARGINE 100 UNIT/ML ~~LOC~~ SOLN
5.0000 [IU] | Freq: Every day | SUBCUTANEOUS | Status: DC
  Filled 2016-10-01: qty 0.05

## 2016-10-01 MED ORDER — INSULIN GLARGINE 100 UNIT/ML ~~LOC~~ SOLN
5.0000 [IU] | Freq: Every day | SUBCUTANEOUS | Status: DC
  Administered 2016-10-01 – 2016-10-06 (×6): 5 [IU] via SUBCUTANEOUS
  Filled 2016-10-01 (×6): qty 0.05

## 2016-10-01 MED ORDER — HEPARIN SODIUM (PORCINE) 5000 UNIT/ML IJ SOLN
5000.0000 [IU] | Freq: Three times a day (TID) | INTRAMUSCULAR | Status: DC
  Administered 2016-10-01 – 2016-10-06 (×16): 5000 [IU] via SUBCUTANEOUS
  Filled 2016-10-01 (×18): qty 1

## 2016-10-01 MED ORDER — INSULIN ASPART 100 UNIT/ML ~~LOC~~ SOLN: 1.0000 [IU] | SUBCUTANEOUS | Status: DC

## 2016-10-01 MED ORDER — INSULIN ASPART 100 UNIT/ML ~~LOC~~ SOLN
2.0000 [IU] | SUBCUTANEOUS | Status: DC
  Administered 2016-10-01: 2 [IU] via SUBCUTANEOUS
  Administered 2016-10-01 (×2): 6 [IU] via SUBCUTANEOUS
  Administered 2016-10-02 (×3): 4 [IU] via SUBCUTANEOUS
  Administered 2016-10-02: 2 [IU] via SUBCUTANEOUS
  Administered 2016-10-02: 4 [IU] via SUBCUTANEOUS
  Administered 2016-10-02: 2 [IU] via SUBCUTANEOUS
  Administered 2016-10-03: 6 [IU] via SUBCUTANEOUS
  Administered 2016-10-03: 2 [IU] via SUBCUTANEOUS

## 2016-10-01 MED ORDER — DEXTROSE 10 % IV SOLN: INTRAVENOUS | Status: DC | PRN

## 2016-10-01 MED ORDER — VANCOMYCIN HCL IN DEXTROSE 750-5 MG/150ML-% IV SOLN
750.0000 mg | Freq: Three times a day (TID) | INTRAVENOUS | Status: DC
  Administered 2016-10-01 – 2016-10-02 (×3): 750 mg via INTRAVENOUS
  Filled 2016-10-01 (×5): qty 150

## 2016-10-01 MED ORDER — HEPARIN SODIUM (PORCINE) 5000 UNIT/ML IJ SOLN: 5000.0000 [IU] | Freq: Three times a day (TID) | INTRAMUSCULAR | Status: DC

## 2016-10-01 MED ORDER — PREGABALIN 100 MG PO CAPS
100.0000 mg | ORAL_CAPSULE | Freq: Two times a day (BID) | ORAL | Status: DC
  Administered 2016-10-01 – 2016-10-06 (×10): 100 mg via ORAL
  Filled 2016-10-01: qty 1
  Filled 2016-10-01: qty 2
  Filled 2016-10-01: qty 1
  Filled 2016-10-01: qty 2
  Filled 2016-10-01 (×2): qty 1
  Filled 2016-10-01: qty 2
  Filled 2016-10-01: qty 1
  Filled 2016-10-01: qty 2
  Filled 2016-10-01: qty 1

## 2016-10-01 MED ORDER — POTASSIUM CHLORIDE 20 MEQ/15ML (10%) PO SOLN
20.0000 meq | ORAL | Status: AC
  Administered 2016-10-01: 20 meq
  Filled 2016-10-01: qty 15

## 2016-10-01 MED ORDER — FENTANYL CITRATE (PF) 100 MCG/2ML IJ SOLN
25.0000 ug | INTRAMUSCULAR | Status: DC | PRN
  Administered 2016-10-01: 50 ug via INTRAVENOUS
  Administered 2016-10-01: 25 ug via INTRAVENOUS
  Administered 2016-10-02: 50 ug via INTRAVENOUS
  Administered 2016-10-02: 25 ug via INTRAVENOUS
  Administered 2016-10-02: 100 ug via INTRAVENOUS
  Administered 2016-10-02 (×3): 50 ug via INTRAVENOUS
  Administered 2016-10-03 (×2): 100 ug via INTRAVENOUS
  Filled 2016-10-01 (×10): qty 2

## 2016-10-01 NOTE — Progress Notes (Signed)
Patient ID: [name], female   DOB: 06-26-1966, 50 y.o.   MRN: [id number]  [facility] Surgery Progress Note  1 Day Post-Op  Subjective: Lying in bed. She does in appear in pain but no acute distress. No family at bedside.  Objective: Vital signs in last 24 hours: Temp:  [98.1 F (36.7 C)-99.5 F (37.5 C)] 98.1 F (36.7 C) (11/14 0741) Pulse Rate:  [74-118] 76 (11/14 0700) Resp:  [13-26] 17 (11/14 0700) BP: (80-157)/(54-82) 97/66 (11/14 0700) SpO2:  [91 %-100 %] 100 % (11/14 0700) Arterial Line BP: (100-122)/(55-57) 100/56 (11/14 0700) FiO2 (%):  [40 %-50 %] 40 % (11/14 0324) Weight:  [150 lb 5.7 oz (68.2 kg)-151 lb (68.5 kg)] 150 lb 5.7 oz (68.2 kg) (11/13 2016)    Intake/Output from previous day: 11/13 0701 - 11/14 0700 In: 4791.1 [I.V.:3861.1; IV Piggyback:930] Out: 1620 [Urine:1600; Blood:20] Intake/Output this shift: No intake/output data recorded.  PE: Gen:  Alert, NAD, pleasant Card:  RRR Pulm:  CTAB Abd: Soft, NT/ND, +BS Skin: right buttock wound s/p I&D, tissue beefy red with no purulent drainage or foul odor. Surrounding erythema decreased from yesterday     Lab Results:   Recent Labs  09/30/16 1226 09/30/16 1954 10/01/16 0400  WBC 27.4*  --  24.1*  HGB 15.1* 11.2* 12.5  HCT 43.3 33.0* 36.4  PLT 233  --  340   BMET  Recent Labs  09/30/16 2350 10/01/16 0400  NA 137 139  K 2.9* 3.6  CL 105 108  CO2 24 23  GLUCOSE 310* 268*  BUN 6 7  CREATININE 0.53 0.45  CALCIUM 7.6* 7.6*   PT/INR  Recent Labs  09/30/16 1426  LABPROT 17.9*  INR 1.46   CMP     Component Value Date/Time   NA 139 10/01/2016 0400   K 3.6 10/01/2016 0400   CL 108 10/01/2016 0400   CO2 23 10/01/2016 0400   GLUCOSE 268 (H) 10/01/2016 0400   BUN 7 10/01/2016 0400   CREATININE 0.45 10/01/2016 0400   CALCIUM 7.6 (L) 10/01/2016 0400   PROT 6.9 09/30/2016 1400   ALBUMIN 2.4 (L) 09/30/2016 1400   AST 46 (H) 09/30/2016 1400   ALT 14 09/30/2016 1400   ALKPHOS 112 09/30/2016 1400   BILITOT 0.9 09/30/2016 1400   GFRNONAA >60 10/01/2016 0400   GFRAA >60 10/01/2016 0400   Lipase     Component Value Date/Time   LIPASE 18 09/30/2016 1426   Vent: Vent Mode: PRVC FiO2 (%):  [40 %-50 %] 40 % Set Rate:  [16 bmp] 16 bmp Vt Set:  [490 mL] 490 mL PEEP:  [5 cmH20] 5 cmH20 Plateau Pressure:  [16 cmH20-19 cmH20] 16 cmH20    Studies/Results: Dg Chest 2 View  Result Date: 09/30/2016 CLINICAL DATA:  RIGHT-sided weakness, chills, subjective fever. RIGHT gluteal infection and severe pain. Fall 2 months ago. History of diabetes. EXAM: CHEST  2 VIEW COMPARISON:  Chest radiograph June 27, 2011 FINDINGS: Cardiomediastinal silhouette is normal. Mild bronchitic changes. No pleural effusions or focal consolidations. Trachea projects midline and there is no pneumothorax. Soft tissue planes and included osseous structures are non-suspicious. Surgical clips in the included right abdomen compatible with cholecystectomy. IMPRESSION: Mild bronchitic changes. Electronically Signed   By: Elon Alas M.D.   On: 09/30/2016 14:20   Ct Head Wo Contrast  Result Date: 09/30/2016 CLINICAL DATA:  50 year old female with right side weakness, right arm numbness, "right eye droop". Initial encounter. EXAM: CT HEAD WITHOUT CONTRAST  TECHNIQUE: Contiguous axial images were obtained from the base of the skull through the vertex without intravenous contrast. COMPARISON:  Face CT without contrast 02/14/2011. Brain MRI 09/14/2007. FINDINGS: Brain: No midline shift, ventriculomegaly, mass effect, evidence of mass lesion, intracranial hemorrhage or evidence of cortically based acute infarction. Gray-white matter differentiation is within normal limits throughout the brain. Vascular: Calcified atherosclerosis at the skull base. No suspicious intracranial vascular hyperdensity. Skull: No osseous abnormality identified. Sinuses/Orbits: Mild bubbly opacity and mucosal thickening in the  posterior right ethmoid air cell. Other Visualized paranasal sinuses and mastoids are stable and well pneumatized. Other: Abnormal right globe with posterior hyperdensity with a v-shaped extension toward the lens (series 2, image 5). The right globe was normal on prior studies. The other visible bilateral orbits soft tissues are normal. Visualized scalp soft tissues are within normal limits. IMPRESSION: 1. Abnormal right globe, new since 2012. Top differential considerations include right retinal tumor, retinal tear, or other vitreous hemorrhage. The appearance is not typical of retinal or choroidal detachment, or post treatment changes to the globe. 2.  Normal noncontrast CT appearance of the brain. 3. Mild acute inflammation of the posterior right ethmoid air cell. Electronically Signed   By: Genevie Ann M.D.   On: 09/30/2016 15:27   Ct Abdomen Pelvis W Contrast  Result Date: 09/30/2016 CLINICAL DATA:  50 year old female with right sided extremity weakness, chills, subjective fevers and right gluteus infection with severe pain. EXAM: CT ABDOMEN AND PELVIS WITH CONTRAST TECHNIQUE: Multidetector CT imaging of the abdomen and pelvis was performed using the standard protocol following bolus administration of intravenous contrast. CONTRAST:  155mL ISOVUE-300 IOPAMIDOL (ISOVUE-300) INJECTION 61% COMPARISON:  03/01/2008 CT angiogram of the abdomen and pelvis. FINDINGS: Lower chest: Hypoventilatory changes in the dependent lung bases. Coronary atherosclerosis. Hepatobiliary: Normal liver size. Hypodense subcapsular 1.2 cm liver lesion in the far inferior right liver lobe (series 2/ image 41), new since 03/01/2008. No additional liver lesions. Cholecystectomy. No biliary ductal dilatation. Pancreas: Normal, with no mass or duct dilation. Spleen: Normal size spleen. There are 5 hypodense lesions scattered throughout the spleen, largest 1.4 cm, not easily compared to the 03/01/2008 CT study, which was performed at a  different phase of contrast. Adrenals/Urinary Tract: Normal adrenals. There is symmetric fullness of the central renal collecting systems without overt hydronephrosis. No renal mass. Distended bladder without bladder wall thickening. Stomach/Bowel: Grossly normal stomach. Normal caliber small bowel with no small bowel wall thickening. Normal appendix. Normal large bowel with no diverticulosis, large bowel wall thickening or pericolonic fat stranding. Vascular/Lymphatic: Atherosclerotic nonaneurysmal abdominal aorta. Right external iliac artery and right common femoral artery stents are in place. Apparent occlusion of the right common iliac artery. Patent portal, hepatic, splenic and renal veins. No pathologically enlarged lymph nodes in the abdomen or pelvis. Reproductive: Grossly normal uterus.  No adnexal mass. Other: No pneumoperitoneum, ascites or focal fluid collection. There is extensive soft tissue gas in the subcutaneous and gluteal muscle tissues of the right lower buttock. There is extensive soft tissue gas extending into the right ischiorectal fat and right hip adductor muscles. No focal fluid collections. Musculoskeletal: No aggressive appearing focal osseous lesions. Status post bilateral posterior spinal fusion at L5-S1 with bone cages in the L4-5 and L5-S1 disc spaces. Healed deformity in the medial right iliac bone from bone graft harvest defect. Mild thoracolumbar spondylosis. IMPRESSION: 1. Extensive soft tissue gas in the subcutaneous soft tissues and gluteal muscles of the right lower buttock. Extensive soft tissue gas extends  into the right ischiorectal fat and right hip adductor muscles. No focal fluid collections. Please note that the inferior extent of this process is not demonstrated on this CT study. These findings are worrisome for necrotizing fasciitis and urgent surgical consultation is advised. 2. 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 3. Symmetric fullness of the renal collecting systems without overt hydronephrosis, probably due to bladder distention. Correlate clinically to exclude bladder outlet obstruction. 4. **An incidental finding of potential clinical significance has been found. New subcapsular low-attenuation inferior right liver lobe lesion, indeterminate. Several small low-attenuation splenic lesions, not definitely seen on 2009 CT, indeterminate. MRI of the abdomen without and with IV contrast is recommended on a short term outpatient basis. ** 5. Coronary atherosclerosis. These results were called by telephone at the time of interpretation on 09/30/2016 at 3:45 pm to Dr. Marda Stalker , who verbally acknowledged these results. Electronically Signed   By: Ilona Sorrel M.D.   On: 09/30/2016 15:46   Dg Chest Port 1 View  Result Date: 10/01/2016 CLINICAL DATA:  Acute onset of shortness of breath and respiratory failure. Initial encounter. EXAM: PORTABLE CHEST 1 VIEW COMPARISON:  Chest radiograph performed 09/30/2016 FINDINGS: The patient's endotracheal tube is seen ending 2-3 cm above the carina. An enteric tube is noted extending below the diaphragm. The lungs are well-aerated. Minimal left basilar atelectasis is noted. There is no evidence of pleural effusion or pneumothorax. The cardiomediastinal silhouette is borderline normal in size. No acute osseous abnormalities are seen. IMPRESSION: 1. Endotracheal tube seen ending 2-3 cm above the carina. 2. Minimal left basilar atelectasis noted. Electronically Signed   By: Garald Balding M.D.   On: 10/01/2016 01:48   Dg Abd Portable 1v  Result Date: 10/01/2016 CLINICAL DATA:  Orogastric tube placement.  Initial encounter. EXAM: PORTABLE ABDOMEN - 1 VIEW COMPARISON:  CT of the abdomen and pelvis from 09/30/2016 FINDINGS: The patient's enteric tube is noted ending overlying the body of the stomach. The visualized bowel gas pattern is grossly unremarkable, with a  small amount of stool noted in the colon. Clips are noted within the right upper quadrant, reflecting prior cholecystectomy. No acute osseous abnormalities are identified. Lumbosacral fusion hardware is noted. IMPRESSION: Enteric tube noted ending overlying the body of the stomach. Electronically Signed   By: Garald Balding M.D.   On: 10/01/2016 01:47    Anti-infectives: Anti-infectives    Start     Dose/Rate Route Frequency Ordered Stop   10/01/16 0300  vancomycin (VANCOCIN) IVPB 750 mg/150 ml premix     750 mg 150 mL/hr over 60 Minutes Intravenous Every 12 hours 09/30/16 1338     09/30/16 2200  ceFEPIme (MAXIPIME) 1 g in dextrose 5 % 50 mL IVPB     1 g 100 mL/hr over 30 Minutes Intravenous Every 8 hours 09/30/16 1338     09/30/16 1800  clindamycin (CLEOCIN) IVPB 600 mg     600 mg 100 mL/hr over 30 Minutes Intravenous Every 6 hours 09/30/16 1633     09/30/16 1330  levofloxacin (LEVAQUIN) IVPB 750 mg  Status:  Discontinued     750 mg 100 mL/hr over 90 Minutes Intravenous  Once 09/30/16 1323 09/30/16 1328   09/30/16 1330  aztreonam (AZACTAM) 2 g in dextrose 5 % 50 mL IVPB  Status:  Discontinued     2 g 100 mL/hr over 30 Minutes Intravenous  Once 09/30/16 1323 09/30/16 1328   09/30/16 1330  vancomycin (  VANCOCIN) IVPB 1000 mg/200 mL premix     1,000 mg 200 mL/hr over 60 Minutes Intravenous  Once 09/30/16 1323 09/30/16 1535   09/30/16 1330  ceFEPIme (MAXIPIME) 2 g in dextrose 5 % 50 mL IVPB     2 g 100 mL/hr over 30 Minutes Intravenous  Once 09/30/16 1328 09/30/16 1535       Assessment/Plan Necrotizing fascitis right buttock Procedure(s): DEBRIDEMENT RIGHT  BUTTOCK WOUND 16cm LONG X 11cm WIDE X 3cm DEEP 11/13 Dr. Grandville Silos - POD 1 - gram stain: ABUNDANT WBC PRESENT,BOTH PMN AND MONONUCLEAR ; ABUNDANT GRAM NEGATIVE RODS; ABUNDANT GRAM POSITIVE COCCI IN PAIRS IN CLUSTERS; FEW GRAM POSITIVE RODS - culture pending  Troponin elevation Sepsis Hypokalemia, Hypomag, Hyponatremia -  repleting HHNK PAD HLD Asthma Chronic pain Tobacco abuse   ID - Cefepime 11/13 >>, Vancomycin 11/13 >>, clinda 11/13>>; blood, urine, and surgical cultures pending FEN - NPO, IVF VTE -   Plan - appreciate CCM management of medical problems. Wound is healing well, does not appear to need further surgical debridement at this time (pictured above). Recommend BID wet to dry dressing changes. Continue antibiotics, follow cultures.   LOS: 1 day    Jerrye Beavers , [facility] Surgery 10/01/2016, 7:43 AM Pager: [phone] Consults: [phone] Mon-Fri 7:00 am-4:30 pm Sat-Sun 7:00 am-11:30 am

## 2016-10-01 NOTE — Progress Notes (Signed)
[place] Progress Note Patient Name: [name] DOB: [birth year] MRN: [id number]   Date of Service  10/01/2016  HPI/Events of Note  [place] K  eICU Interventions  Replete K     Intervention Category Major Interventions: Other:  [place] 10/01/2016, 12:31 AM

## 2016-10-01 NOTE — Care Management Note (Signed)
Case Management Note  Patient Details  Name: KYLENA MOLE MRN: [id number] Date of Birth: 1966/05/17  Subjective/Objective:         Admitted post I&D for right buttock wound           Action/Plan:  PTA from home with husband.  Both husband and pt state the wound started as a small spot that was swollen and red around the edges -  pt has had a mostly sedentary life style recently per husband.   Pt needs assistance from husband for ADLs.  Husband can provide care and supervision at discharge. - both pt and husband are open to SNF if recommended.  PT eval ordered.  CM will continue to follow for discharge needs   Expected Discharge Date:                  Expected Discharge Plan:  [place]  In-House Referral:     Discharge planning Services  CM Consult  Post Acute Care Choice:    Choice offered to:     DME Arranged:    DME Agency:     HH Arranged:    [place] Agency:  [place]  Status of Service:     If discussed at [place] of Stay Meetings, dates discussed:    Additional Comments:  [name], RN 10/01/2016, 11:52 AM

## 2016-10-01 NOTE — Consult Note (Signed)
PULMONARY / CRITICAL CARE MEDICINE   Name: [name] MRN: [id number] DOB: 1966-08-21    ADMISSION DATE:  09/30/2016 CONSULTATION DATE:  11/13  REFERRING MD:  Dr. Grandville Silos CCS  CHIEF COMPLAINT:  Necrotizing Fasciitis   BRIEF:  50 year old female with past medical history as below, which is significant for asthma, diabetes, peripheral artery disease, and GERD. Current issue started about 1 month ago when she noted an ulcer on her right buttock which she treated at home with some ointment and apparently resolved. This is again got worse over the past week. Then 11/13 she presented to [facility] emergency department with complaints of right-sided weakness and paresthesia. She also is complaining of severe pain in the area of that ulceration on the right buttock. The area was noted to be red and with drainage. She was taken for a CT of the area which demonstrated  Gas in the soft tissue and muscle. She was also hyperglycemic in the emergency department with a glucose of 800. She was taken emergently to the operating room for necrotizing fasciitis. PCCM asked to assist with medical management   REVIEW OF SYSTEMS:   ROS: PER HPI   SIGNIFICANT EVENTS: 11/13- to OR nec fasc, sepsis  SUBJECTIVE:  Surgical debridement of necrotizing fascitis of right buttock performed yesterday.   VITAL SIGNS: BP (!) 87/75   Pulse 77   Temp 99.5 F (37.5 C) (Oral)   Resp 16   Ht 5\' 7"  (1.702 m)   Wt 150 lb 5.7 oz (68.2 kg)   SpO2 100%   BMI 23.55 kg/m   HEMODYNAMICS:    VENTILATOR SETTINGS: Vent Mode: PRVC FiO2 (%):  [40 %-50 %] 40 % Set Rate:  [16 bmp] 16 bmp Vt Set:  [490 mL] 490 mL PEEP:  [5 cmH20] 5 cmH20 Plateau Pressure:  [16 cmH20-19 cmH20] 16 cmH20  INTAKE / OUTPUT: I/O last 3 completed shifts: In: 4494.5 [I.V.:3564.5; IV Piggyback:930] Out: 6789 [Urine:1600; Blood:20]  PHYSICAL EXAMINATION: General:  Sedated, lying in bed  Neuro:  Moves to touch. Opens eyes spontaneously.   Cardiovascular:  s1 s RRR Lungs:  CTA Abdomen:  Soft, bs wnl, no r/g   LABS:  BMET  Recent Labs Lab 09/30/16 2048 09/30/16 2350 10/01/16 0400  NA 136 137 139  K 2.8* 2.9* 3.6  CL 102 105 108  CO2 24 24 23   BUN 8 6 7   CREATININE 0.55 0.53 0.45  GLUCOSE 347* 310* 268*    Electrolytes  Recent Labs Lab 09/30/16 1743 09/30/16 2048 09/30/16 2350 10/01/16 0400  CALCIUM 8.5* 7.7* 7.6* 7.6*  MG 1.3*  --   --   --     CBC  Recent Labs Lab 09/30/16 1226 09/30/16 1954 10/01/16 0400  WBC 27.4*  --  24.1*  HGB 15.1* 11.2* 12.5  HCT 43.3 33.0* 36.4  PLT 233  --  340    Coag's  Recent Labs Lab 09/30/16 1426  INR 1.46    Sepsis Markers  Recent Labs Lab 09/30/16 1351 09/30/16 1556 10/01/16 0400  LATICACIDVEN 3.00* 2.25* 0.8    ABG  Recent Labs Lab 09/30/16 1954  PHART 7.387  PCO2ART 47.0  PO2ART 484.0*    Liver Enzymes  Recent Labs Lab 09/30/16 1400  AST 46*  ALT 14  ALKPHOS 112  BILITOT 0.9  ALBUMIN 2.4*    Cardiac Enzymes  Recent Labs Lab 09/30/16 1743  TROPONINI 0.09*    Glucose  Recent Labs Lab 09/30/16 1914 09/30/16 2045 09/30/16 2211  09/30/16 2350 10/01/16 0110 10/01/16 0407  GLUCAP 409* 354* 312* 287* 290* 253*    Imaging Dg Chest 2 View  Result Date: 09/30/2016 CLINICAL DATA:  RIGHT-sided weakness, chills, subjective fever. RIGHT gluteal infection and severe pain. Fall 2 months ago. History of diabetes. EXAM: CHEST  2 VIEW COMPARISON:  Chest radiograph June 27, 2011 FINDINGS: Cardiomediastinal silhouette is normal. Mild bronchitic changes. No pleural effusions or focal consolidations. Trachea projects midline and there is no pneumothorax. Soft tissue planes and included osseous structures are non-suspicious. Surgical clips in the included right abdomen compatible with cholecystectomy. IMPRESSION: Mild bronchitic changes. Electronically Signed   By: Elon Alas M.D.   On: 09/30/2016 14:20   Ct Head Wo  Contrast  Result Date: 09/30/2016 CLINICAL DATA:  50 year old female with right side weakness, right arm numbness, "right eye droop". Initial encounter. EXAM: CT HEAD WITHOUT CONTRAST TECHNIQUE: Contiguous axial images were obtained from the base of the skull through the vertex without intravenous contrast. COMPARISON:  Face CT without contrast 02/14/2011. Brain MRI 09/14/2007. FINDINGS: Brain: No midline shift, ventriculomegaly, mass effect, evidence of mass lesion, intracranial hemorrhage or evidence of cortically based acute infarction. Gray-white matter differentiation is within normal limits throughout the brain. Vascular: Calcified atherosclerosis at the skull base. No suspicious intracranial vascular hyperdensity. Skull: No osseous abnormality identified. Sinuses/Orbits: Mild bubbly opacity and mucosal thickening in the posterior right ethmoid air cell. Other Visualized paranasal sinuses and mastoids are stable and well pneumatized. Other: Abnormal right globe with posterior hyperdensity with a v-shaped extension toward the lens (series 2, image 5). The right globe was normal on prior studies. The other visible bilateral orbits soft tissues are normal. Visualized scalp soft tissues are within normal limits. IMPRESSION: 1. Abnormal right globe, new since 2012. Top differential considerations include right retinal tumor, retinal tear, or other vitreous hemorrhage. The appearance is not typical of retinal or choroidal detachment, or post treatment changes to the globe. 2.  Normal noncontrast CT appearance of the brain. 3. Mild acute inflammation of the posterior right ethmoid air cell. Electronically Signed   By: Genevie Ann M.D.   On: 09/30/2016 15:27   Ct Abdomen Pelvis W Contrast  Result Date: 09/30/2016 CLINICAL DATA:  50 year old female with right sided extremity weakness, chills, subjective fevers and right gluteus infection with severe pain. EXAM: CT ABDOMEN AND PELVIS WITH CONTRAST TECHNIQUE:  Multidetector CT imaging of the abdomen and pelvis was performed using the standard protocol following bolus administration of intravenous contrast. CONTRAST:  179mL ISOVUE-300 IOPAMIDOL (ISOVUE-300) INJECTION 61% COMPARISON:  03/01/2008 CT angiogram of the abdomen and pelvis. FINDINGS: Lower chest: Hypoventilatory changes in the dependent lung bases. Coronary atherosclerosis. Hepatobiliary: Normal liver size. Hypodense subcapsular 1.2 cm liver lesion in the far inferior right liver lobe (series 2/ image 41), new since 03/01/2008. No additional liver lesions. Cholecystectomy. No biliary ductal dilatation. Pancreas: Normal, with no mass or duct dilation. Spleen: Normal size spleen. There are 5 hypodense lesions scattered throughout the spleen, largest 1.4 cm, not easily compared to the 03/01/2008 CT study, which was performed at a different phase of contrast. Adrenals/Urinary Tract: Normal adrenals. There is symmetric fullness of the central renal collecting systems without overt hydronephrosis. No renal mass. Distended bladder without bladder wall thickening. Stomach/Bowel: Grossly normal stomach. Normal caliber small bowel with no small bowel wall thickening. Normal appendix. Normal large bowel with no diverticulosis, large bowel wall thickening or pericolonic fat stranding. Vascular/Lymphatic: Atherosclerotic nonaneurysmal abdominal aorta. Right external iliac artery and right common femoral  artery stents are in place. Apparent occlusion of the right common iliac artery. Patent portal, hepatic, splenic and renal veins. No pathologically enlarged lymph nodes in the abdomen or pelvis. Reproductive: Grossly normal uterus.  No adnexal mass. Other: No pneumoperitoneum, ascites or focal fluid collection. There is extensive soft tissue gas in the subcutaneous and gluteal muscle tissues of the right lower buttock. There is extensive soft tissue gas extending into the right ischiorectal fat and right hip adductor muscles.  No focal fluid collections. Musculoskeletal: No aggressive appearing focal osseous lesions. Status post bilateral posterior spinal fusion at L5-S1 with bone cages in the L4-5 and L5-S1 disc spaces. Healed deformity in the medial right iliac bone from bone graft harvest defect. Mild thoracolumbar spondylosis. IMPRESSION: 1. Extensive soft tissue gas in the subcutaneous soft tissues and gluteal muscles of the right lower buttock. Extensive soft tissue gas extends into the right ischiorectal fat and right hip adductor muscles. No focal fluid collections. Please note that the inferior extent of this process is not demonstrated on this CT study. These findings are worrisome for necrotizing fasciitis and urgent surgical consultation is advised. 2. 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 3. Symmetric fullness of the renal collecting systems without overt hydronephrosis, probably due to bladder distention. Correlate clinically to exclude bladder outlet obstruction. 4. **An incidental finding of potential clinical significance has been found. New subcapsular low-attenuation inferior right liver lobe lesion, indeterminate. Several small low-attenuation splenic lesions, not definitely seen on 2009 CT, indeterminate. MRI of the abdomen without and with IV contrast is recommended on a short term outpatient basis. ** 5. Coronary atherosclerosis. These results were called by telephone at the time of interpretation on 09/30/2016 at 3:45 pm to Dr. Marda Stalker , who verbally acknowledged these results. Electronically Signed   By: Ilona Sorrel M.D.   On: 09/30/2016 15:46   Dg Chest Port 1 View  Result Date: 10/01/2016 CLINICAL DATA:  Acute onset of shortness of breath and respiratory failure. Initial encounter. EXAM: PORTABLE CHEST 1 VIEW COMPARISON:  Chest radiograph performed 09/30/2016 FINDINGS: The patient's endotracheal tube is seen ending 2-3 cm above  the carina. An enteric tube is noted extending below the diaphragm. The lungs are well-aerated. Minimal left basilar atelectasis is noted. There is no evidence of pleural effusion or pneumothorax. The cardiomediastinal silhouette is borderline normal in size. No acute osseous abnormalities are seen. IMPRESSION: 1. Endotracheal tube seen ending 2-3 cm above the carina. 2. Minimal left basilar atelectasis noted. Electronically Signed   By: Garald Balding M.D.   On: 10/01/2016 01:48   Dg Abd Portable 1v  Result Date: 10/01/2016 CLINICAL DATA:  Orogastric tube placement.  Initial encounter. EXAM: PORTABLE ABDOMEN - 1 VIEW COMPARISON:  CT of the abdomen and pelvis from 09/30/2016 FINDINGS: The patient's enteric tube is noted ending overlying the body of the stomach. The visualized bowel gas pattern is grossly unremarkable, with a small amount of stool noted in the colon. Clips are noted within the right upper quadrant, reflecting prior cholecystectomy. No acute osseous abnormalities are identified. Lumbosacral fusion hardware is noted. IMPRESSION: Enteric tube noted ending overlying the body of the stomach. Electronically Signed   By: Garald Balding M.D.   On: 10/01/2016 01:47     STUDIES:  CT abd/pelvis 11/13 > Extensive soft tissue gas in the subcutaneous soft tissues and gluteal muscles of the right lower buttock. Extensive soft tissue gas extends into the right ischiorectal fat and right  hip adductor muscles. No focal fluid collections. Please note that the inferior extent of this process is not demonstrated on this CT study. These findings are worrisome for necrotizing fasciitis and urgent surgical consultation is advised. 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 3. Symmetric fullness of the renal collecting systems without overt hydronephrosis, probably due to bladder distention. Correlate clinically to exclude bladder outlet obstruction.   incidental finding of potential clinical significance has been found. New subcapsular low-attenuation inferior right liver lobe lesion. Several small low-attenuation splenic lesions  Ct head 11/13 >Abnormal right globe, new since 2012. Top differential considerations include right retinal tumor, retinal tear, or other vitreous hemorrhage. The appearance is not typical of retinal or choroidal detachment, or post treatment changes to the globe.  Normal noncontrast CT appearance of the brain. Mild acute inflammation of the posterior right ethmoid air cell.  CULTURES: Blood 11/13 > Urine 11/13 > Wound 11/13 >  ANTIBIOTICS: Cefepime 11/13 > Vancomycin 11/13 > clinda 11/13>>>   LINES/TUBES: ETT 11/13 >>  Art Line, R Radial 11/13 >> will take this out PIV x3  ASSESSMENT / PLAN:  PULMONARY A: Intubated   P:   Cont vent support  SBP today ABG reviewed, min support  CARDIOVASCULAR A:  NSTEMI - mild troponin elevation PAD HLD sepsis P:  Telemetry monitoring  Hold statin while NPO May need cvp Pos balance Levophed to sys 80-90 ( home baseline) with good MS  RENAL A:   Hypokalemia Hypomag Resolved hyponatremia P:   Replete electrolytes as needed Reduce volume rate  GASTROINTESTINAL A:   No acute issues  P:   NPO Pepcid for SUP  HEMATOLOGIC A:   DVt prevention  P:  Trend CBC Sub q heparin add  INFECTIOUS A:   Necrotizing fasciitis or R buttock  P:   Managed by surgery  Cefepime, Clindamycin and vancomycin as above Once off pressors 2 4hrs dc clinda  ENDOCRINE A:   HHNK   P:   Aggressive IVF resuscitation IVF resuscitation Serial BMP to dc, as K wnl Insulin gtt to titrate off to mod SSI  NEUROLOGIC A:   Stroke like symptoms - R sided weakness Sedation with intubation   P:   RASS goal: 0 WUA  Monitor post-op   FAMILY  - Updates: no family at bedside    - Inter-disciplinary family meet or Palliative Care meeting due by:   11/20   Phill Myron, D.O. 10/01/2016, 7:46 AM PGY-2, [facility]   STAFF NOTE: I, Merrie Roof, MD FACP have personally reviewed patient's available data, including medical history, events of note, physical examination and test results as part of my evaluation. I have discussed with resident/NP and other care providers such as pharmacist, RN and RRT. In addition, I personally evaluated patient and elicited key findings of: awake, FC, clear lungs no crackles, pcxr no edema, wound clean, ABg reviewed, SBT planned, no repeat OR trip planned, goal is extubation, diet if successful, dc aline it is NOT needed, levophed off now, her baseline BP is 80-90, will shoot for these goals I documented in my clonsult note, k is resolved, reduce volume over all, cortisol 30 , no role stress roids, chem in am, maintain clinda , dc once off pressors 24 hours, k in saline [place],. Dc seriel bmets, follow OR cultures closely, sedation WUA The patient is critically ill with multiple organ systems failure and requires high complexity decision making for assessment and support, frequent  evaluation and titration of therapies, application of advanced monitoring technologies and extensive interpretation of multiple databases.   Critical Care Time devoted to patient care services described in this note is 35 Minutes. This time reflects time of care of this signee: Merrie Roof, MD FACP. This critical care time does not reflect procedure time, or teaching time or supervisory time of PA/NP/Med student/Med Resident etc but could involve care discussion time. Rest per NP/medical resident whose note is outlined above and that I agree with   Lavon Paganini. Titus Mould, MD, [place] Pgr: [facility] 10/01/2016 9:02 AM

## 2016-10-01 NOTE — Progress Notes (Signed)
Wasted 225 mL Fentanyl with Haig Prophet, RN

## 2016-10-01 NOTE — Progress Notes (Signed)
[facility] Progress Note Patient Name: CAITLYNN JU DOB: [birth year] MRN: [id number]   Date of Service  10/01/2016  HPI/Events of Note  K+ = 2.8 and Creatinine = 0.43  eICU Interventions  Will replace K+.     Intervention Category Intermediate Interventions: Electrolyte abnormality - evaluation and management  Analy Bassford Eugene 10/01/2016, 10:15 PM

## 2016-10-01 NOTE — Consult Note (Signed)
[place] Nurse wound consult note Reason for Consult: right buttock wound Wound type: surgical wound post debridement Pressure Ulcer POA: No Measurement: 16cmx11cmx3cm Wound bed: pale pink Drainage moderate amount of serous sanguineous, no odor   Periwound: intact Dressing procedure/placement/frequency: 1 piece of black foam applied over wound bed and bridged with a 2nd piece of black foam to the right hip. Protected periwound skin with drape under bridge. 1.5 barrier rings used along distal edge of wound. Seal at 172mmhg. Patient tolerated well.   Change M/W/F  Deneise Lever FNP-C, [facility] student  Discussed POC with patient and bedside nurse. [place] nurse will maintain NPWT dressing M/W/F due to complexity.   Thanks,  [employer] MSN, RN,CWOCN, CNS [phone])

## 2016-10-01 NOTE — Progress Notes (Signed)
Pharmacy Antibiotic Note  [name] is a 50 y.o. female admitted on 09/30/2016 with necrotizing faciitis.  Pharmacy has been consulted for vancomycin, cefepime, and clindamycin dosing. Patient has been on vancomycin 750 mg q12h, cefepime 1g IV q8h, and clindamycin 600 mg IV q8h. Pt is afebrile but WBC is elevated at 24.1. Reported SCr is 0.45, but antibiotic dosing is based on SCr of 0.8 and CrCl ~88.6 ml/min.   Plan: Change vancomycin to 750mg  IV Q8H Continue cefepime 1g IV Q8H Continue clindamycin 600 mg q6h F/u renal function, C&S, clinical status, and trough at SS  Height: 5\' 7"  (170.2 cm) Weight: 150 lb 5.7 oz (68.2 kg) IBW/kg (Calculated) : 61.6  Temp (24hrs), Avg:98.4 F (36.9 C), Min:97.7 F (36.5 C), Max:99.5 F (37.5 C)   Recent Labs Lab 09/30/16 1226 09/30/16 1351 09/30/16 1549 09/30/16 1556 09/30/16 1743 09/30/16 2048 09/30/16 2350 10/01/16 0400  WBC 27.4*  --   --   --   --   --   --  24.1*  CREATININE 1.06*  --  0.83  --  0.72 0.55 0.53 0.45  LATICACIDVEN  --  3.00*  --  2.25*  --   --   --  0.8    Estimated Creatinine Clearance: 80 mL/min (by C-G formula based on SCr of 0.45 mg/dL).    Allergies  Allergen Reactions  . Penicillins Other (See Comments)    Severe Headache    Antimicrobials this admission: Vanc 11/13>> Cefepime 11/13>> Clindamycin 11/13>>  Dose adjustments this admission: Vancomycin dose adjusted to 750 mg q8h from 750 mg q12h due to improved renal function.   Microbiology results: 11/13 MRSA PCR: neg 11/13 Wound cx: Sent 11/13 Blood Cx: Sent 11/13 Urine Cx: Multiple species present, suggest recollection   [name] 10/01/2016 12:58 PM

## 2016-10-01 NOTE — Procedures (Signed)
Extubation Procedure Note  Patient Details:   Name: [name] DOB: 04-10-1966 MRN: [id number]   Airway Documentation:     Evaluation  O2 sats: stable throughout Complications: No apparent complications Patient did tolerate procedure well. Bilateral Breath Sounds: Clear   Yes   Patient extubated to 2L nasal cannula.  Positive cuff leak noted.  No evidence of stridor.  Patient able to speak post extubation.  Sats currently 97%.  Vitals are stable.  Incentive spirometry performed x5 with achieved goal of 750.  No apparent complications.  Philomena Doheny 10/01/2016, 10:33 AM

## 2016-10-01 NOTE — Progress Notes (Signed)
CRITICAL VALUE ALERT  Critical value received:  Potassium 2.8  Date of notification:  10/01/16  Time of notification:  2031  Critical value read back: yes  Nurse who received alert:  [place] [id number]  MD notified (1st page):  [place]  Time of first page:  2102

## 2016-10-01 NOTE — Progress Notes (Signed)
[facility] Progress Note Patient Name: [name] DOB: 1966-06-07 MRN: [id number]   Date of Service  10/01/2016  HPI/Events of Note  C/o buttock pain.   eICU Interventions  Will order: 1. Lyrica 100 mg PO BID. 2. Fentanyl 25-100 mcg IV Q 2 hours PRN pain.      Intervention Category Intermediate Interventions: Pain - evaluation and management  Sommer,Steven Eugene 10/01/2016, 6:04 PM

## 2016-10-02 LAB — GLUCOSE, CAPILLARY
GLUCOSE-CAPILLARY: 122 mg/dL — AB (ref 65–99)
GLUCOSE-CAPILLARY: 162 mg/dL — AB (ref 65–99)
GLUCOSE-CAPILLARY: 195 mg/dL — AB (ref 65–99)
Glucose-Capillary: 161 mg/dL — ABNORMAL HIGH (ref 65–99)
Glucose-Capillary: 143 mg/dL — ABNORMAL HIGH (ref 65–99)
Glucose-Capillary: 179 mg/dL — ABNORMAL HIGH (ref 65–99)
Glucose-Capillary: 126 mg/dL — ABNORMAL HIGH (ref 65–99)

## 2016-10-02 LAB — BASIC METABOLIC PANEL
Anion gap: 9 (ref 5–15)
CALCIUM: 7.6 mg/dL — AB (ref 8.9–10.3)
CO2: 25 mmol/L (ref 22–32)
CREATININE: 0.39 mg/dL — AB (ref 0.44–1.00)
Chloride: 101 mmol/L (ref 101–111)
GFR calc Af Amer: >60 mL/min (ref >60)
GLUCOSE: 154 mg/dL — AB (ref 65–99)
Potassium: 3.4 mmol/L — ABNORMAL LOW (ref 3.5–5.1)
SODIUM: 135 mmol/L (ref 135–145)

## 2016-10-02 LAB — CBC
HCT: 36.5 % (ref 36.0–46.0)
Hemoglobin: 12.2 g/dL (ref 12.0–15.0)
MCH: 27.6 pg (ref 26.0–34.0)
MCHC: 33.4 g/dL (ref 30.0–36.0)
MCV: 82.6 fL (ref 78.0–100.0)
PLATELETS: 304 10*3/uL (ref 150–400)
RBC: 4.42 MIL/uL (ref 3.87–5.11)
RDW: 13.6 % (ref 11.5–15.5)
WBC: 17.6 10*3/uL — ABNORMAL HIGH (ref 4.0–10.5)

## 2016-10-02 MED ORDER — METOCLOPRAMIDE HCL 5 MG/ML IJ SOLN
5.0000 mg | Freq: Four times a day (QID) | INTRAMUSCULAR | Status: DC | PRN
  Administered 2016-10-02 – 2016-10-06 (×2): 5 mg via INTRAVENOUS
  Filled 2016-10-02: qty 1
  Filled 2016-10-02: qty 2
  Filled 2016-10-02: qty 1

## 2016-10-02 MED ORDER — PANTOPRAZOLE SODIUM 20 MG PO TBEC
20.0000 mg | DELAYED_RELEASE_TABLET | Freq: Every day | ORAL | Status: DC
  Administered 2016-10-02 – 2016-10-06 (×5): 20 mg via ORAL
  Filled 2016-10-02 (×5): qty 1

## 2016-10-02 MED ORDER — POTASSIUM CHLORIDE CRYS ER 20 MEQ PO TBCR
20.0000 meq | EXTENDED_RELEASE_TABLET | ORAL | Status: AC
  Administered 2016-10-02: 20 meq via ORAL

## 2016-10-02 MED ORDER — DEXTROSE 5 % IV SOLN
1.0000 g | INTRAVENOUS | Status: DC
  Administered 2016-10-02 – 2016-10-04 (×3): 1 g via INTRAVENOUS
  Filled 2016-10-02 (×3): qty 10

## 2016-10-02 MED ORDER — METRONIDAZOLE IN NACL 5-0.79 MG/ML-% IV SOLN
500.0000 mg | Freq: Three times a day (TID) | INTRAVENOUS | Status: DC
  Administered 2016-10-02 – 2016-10-03 (×3): 500 mg via INTRAVENOUS
  Filled 2016-10-02 (×5): qty 100

## 2016-10-02 MED ORDER — POTASSIUM CHLORIDE 20 MEQ/15ML (10%) PO SOLN: 20.0000 meq | ORAL | Status: DC

## 2016-10-02 MED ORDER — PROMETHAZINE HCL 25 MG/ML IJ SOLN
12.5000 mg | Freq: Four times a day (QID) | INTRAMUSCULAR | Status: DC | PRN
  Administered 2016-10-02: 12.5 mg via INTRAVENOUS
  Filled 2016-10-02: qty 1

## 2016-10-02 MED ORDER — INFLUENZA VAC SPLIT QUAD 0.5 ML IM SUSY
0.5000 mL | PREFILLED_SYRINGE | INTRAMUSCULAR | Status: AC | PRN
  Administered 2016-10-06: 0.5 mL via INTRAMUSCULAR
  Filled 2016-10-02: qty 0.5

## 2016-10-02 MED ORDER — POTASSIUM CHLORIDE CRYS ER 20 MEQ PO TBCR
20.0000 meq | EXTENDED_RELEASE_TABLET | ORAL | Status: DC
  Administered 2016-10-02: 20 meq via ORAL
  Filled 2016-10-02 (×2): qty 1

## 2016-10-02 MED ORDER — DEXTROSE 5 % IV SOLN: 1.0000 g | INTRAVENOUS | Status: DC

## 2016-10-02 NOTE — Progress Notes (Signed)
Pt with nausea most of day no relief with current PRN antiemetic or NPO status. Pt now vomiting. Surgery PA Brooke paged see orders.

## 2016-10-02 NOTE — Consult Note (Signed)
WOC called to trouble shoot NPWT VAC dressing due to not "sucking" well.  When I arrived wound dressing is sealed at 134mmHG, intact and no alarms. When I spoke with the bedside nurse the concern was that the canister does not have any drainage.  I have explained to the bedside staff and the surgery team that the wound was fairly dry yesterday when I placed the dressing and I didn't expect that the wound would have much drainage.  Will plan to change again on Friday of this week to get dressing schedule on a M/W/F change.  WOC will follow along with you for VAC dressing changes. [place] MSN, [place], [place]

## 2016-10-02 NOTE — Care Management Note (Deleted)
Case Management Note  Patient Details  Name: [name] MRN: [id number] Date of Birth: May 20, 1966  Subjective/Objective:         Admitted post I&D for right buttock wound           Action/Plan:  PTA from home with husband.  Both husband and pt state the wound started as a small spot that was swollen and red around the edges -  pt has had a mostly sedentary life style recently per husband.   Pt needs assistance from husband for ADLs.  Husband can provide care and supervision at discharge. - both pt and husband are open to SNF if recommended.  PT eval ordered.  CM will continue to follow for discharge needs   Expected Discharge Date:                  Expected Discharge Plan:  [place]  In-House Referral:     Discharge planning Services  CM Consult  Post Acute Care Choice:    Choice offered to:     DME Arranged:    DME Agency:     HH Arranged:    [place] Agency:  [place]  Status of Service:     If discussed at [place] of Stay Meetings, dates discussed:    Additional Comments: 10/02/2016  Pt eval pending to determine safe discharge plan.  Per Surgery; plan is for pt to have wound vac post discharge and per PA are willing to go with [facility] for wound vac equipment if she discharges home.  CM offered choice to pt for DME company and pt chose [facility].  PA to verify with Dr Dalbert Batman to ensure [facility] is acceptable.  CM contacted [facility] DME liaison and informed of pending referral.  CM requested service to complete and sign the wound vac prescription, place an order for both vac and [facility] - prescription form placed on shadow chart.  Pt is not stable for discharge at this time.  Pt offered choice for HH and chose [facility] - CM awaiting orders prior to completing referral - CM requested orders via physician sticky note  Pt has wound vac applied yesterday.  CM paged PA Brooke with surgery to inquire will pt likely discharge home with  [name], RN 10/02/2016, 9:15  AM

## 2016-10-02 NOTE — Progress Notes (Signed)
[facility] ICU Electrolyte Replacement Protocol  Patient Name: JULENE RAHN DOB: Jul 27, 1966 MRN: [id number]  Date of Service  10/02/2016   HPI/Events of Note    Recent Labs Lab 09/30/16 1743  09/30/16 2048 09/30/16 2350 10/01/16 0400 10/01/16 1927 10/02/16 0209  NA 130*  < > 136 137 139 136 135  K 3.2*  < > 2.8* 2.9* 3.6 2.8* 3.4*  CL 94*  --  102 105 108 102 101  CO2 22  --  24 24 23 25 25   GLUCOSE 547*  --  347* 310* 268* 159* 154*  BUN 11  --  8 6 7  <5* <5*  CREATININE 0.72  --  0.55 0.53 0.45 0.43* 0.39*  CALCIUM 8.5*  --  7.7* 7.6* 7.6* 7.8* 7.6*  MG 1.3*  --   --   --   --   --   --   < > = values in this interval not displayed.  Estimated Creatinine Clearance: 80 mL/min (by C-G formula based on SCr of 0.39 mg/dL (L)).  Intake/Output      11/14 0701 - 11/15 0700   I.V. (mL/kg) 2486.8 (33.7)   IV Piggyback 1115   Total Intake(mL/kg) 3601.8 (48.9)   Urine (mL/kg/hr) 2800 (1.6)   Stool 0 (0)   Total Output 2800   Net +801.8       Stool Occurrence 1 x    - I/O DETAILED x24h    Total I/O In: 2040 [I.V.:1125; IV Piggyback:915] Out: 1500 [Urine:1500] - I/O THIS SHIFT    ASSESSMENT   eICURN Interventions  Labs replaced using ICU protocol   ASSESSMENT: [place], Ronnald Shedden Nicole 10/02/2016, 6:43 AM

## 2016-10-02 NOTE — Consult Note (Signed)
PULMONARY / CRITICAL CARE MEDICINE   Name: [name] MRN: [id number] DOB: 1966-10-17    ADMISSION DATE:  09/30/2016 CONSULTATION DATE:  11/13  REFERRING MD:  Dr. Grandville Silos CCS  CHIEF COMPLAINT:  Necrotizing Fasciitis   BRIEF:  50 year old female with past medical history as below, which is significant for asthma, diabetes, peripheral artery disease, and GERD. Current issue started about 1 month ago when she noted an ulcer on her right buttock which she treated at home with some ointment and apparently resolved. This is again got worse over the past week. Then 11/13 she presented to [facility] emergency department with complaints of right-sided weakness and paresthesia. She also is complaining of severe pain in the area of that ulceration on the right buttock. The area was noted to be red and with drainage. She was taken for a CT of the area which demonstrated  Gas in the soft tissue and muscle. She was also hyperglycemic in the emergency department with a glucose of 800. She was taken emergently to the operating room for necrotizing fasciitis. PCCM asked to assist with medical management   REVIEW OF SYSTEMS:   ROS: PER HPI   SIGNIFICANT EVENTS: 11/13- to OR nec fasc, sepsis  SUBJECTIVE:  Off vent, no distress   VITAL SIGNS: BP (!) 91/58 (BP Location: Right Arm)   Pulse (!) 105   Temp 98.2 F (36.8 C) (Oral)   Resp 17   Ht 5\' 7"  (1.702 m)   Wt 73.7 kg (162 lb 7.7 oz)   SpO2 97%   BMI 25.45 kg/m   HEMODYNAMICS:    VENTILATOR SETTINGS:    INTAKE / OUTPUT: I/O last 3 completed shifts: In: 8392.9 [I.V.:6347.9; IV Piggyback:2045] Out: 7829 [Urine:4400; Blood:20]  PHYSICAL EXAMINATION: General: awake in chair Neuro:nonfocal exam  HEENT: ett gone, jvd wnl PULM: CTA ant CV:  s1 s2 RRR, mild int tachy GI: soft, BS wnl, no  Extremities: limited edema Wound clean   LABS:  BMET  Recent Labs Lab 10/01/16 0400 10/01/16 1927 10/02/16 0209  NA 139 136 135  K  3.6 2.8* 3.4*  CL 108 102 101  CO2 23 25 25   BUN 7 <5* <5*  CREATININE 0.45 0.43* 0.39*  GLUCOSE 268* 159* 154*    Electrolytes  Recent Labs Lab 09/30/16 1743  10/01/16 0400 10/01/16 1927 10/02/16 0209  CALCIUM 8.5*  < > 7.6* 7.8* 7.6*  MG 1.3*  --   --   --   --   < > = values in this interval not displayed.  CBC  Recent Labs Lab 09/30/16 1226 09/30/16 1954 10/01/16 0400 10/02/16 0209  WBC 27.4*  --  24.1* 17.6*  HGB 15.1* 11.2* 12.5 12.2  HCT 43.3 33.0* 36.4 36.5  PLT 233  --  340 304    Coag's  Recent Labs Lab 09/30/16 1426  INR 1.46    Sepsis Markers  Recent Labs Lab 09/30/16 1351 09/30/16 1556 10/01/16 0400  LATICACIDVEN 3.00* 2.25* 0.8    ABG  Recent Labs Lab 09/30/16 1954  PHART 7.387  PCO2ART 47.0  PO2ART 484.0*    Liver Enzymes  Recent Labs Lab 09/30/16 1400  AST 46*  ALT 14  ALKPHOS 112  BILITOT 0.9  ALBUMIN 2.4*    Cardiac Enzymes  Recent Labs Lab 09/30/16 1743  TROPONINI 0.09*    Glucose  Recent Labs Lab 10/01/16 1206 10/01/16 1702 10/01/16 2023 10/02/16 0045 10/02/16 0446 10/02/16 0802  GLUCAP 217* 202* 143* 195* 161* 143*  Imaging No results found.   STUDIES:  CT abd/pelvis 11/13 > Extensive soft tissue gas in the subcutaneous soft tissues and gluteal muscles of the right lower buttock. Extensive soft tissue gas extends into the right ischiorectal fat and right hip adductor muscles. No focal fluid collections. Please note that the inferior extent of this process is not demonstrated on this CT study. These findings are worrisome for necrotizing fasciitis and urgent surgical consultation is advised. 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 3. Symmetric fullness of the renal collecting systems without overt hydronephrosis, probably due to bladder distention. Correlate clinically to exclude bladder outlet obstruction.  incidental finding of  potential clinical significance has been found. New subcapsular low-attenuation inferior right liver lobe lesion. Several small low-attenuation splenic lesions  Ct head 11/13 >Abnormal right globe, new since 2012. Top differential considerations include right retinal tumor, retinal tear, or other vitreous hemorrhage. The appearance is not typical of retinal or choroidal detachment, or post treatment changes to the globe.  Normal noncontrast CT appearance of the brain. Mild acute inflammation of the posterior right ethmoid air cell.  CULTURES: Blood 11/13 > Urine 11/13 > Wound 11/13 >  ANTIBIOTICS: Cefepime 11/13 >>>11/15 Vancomycin 11/13 >11/15 clinda 11/13>>>11/15 Ceftriaxone 11/15>>>consider 10 days totoal Flagyl 11/15>>>considrt total 7 days anerobic    LINES/TUBES: ETT 11/13 >> 11/14 Art Line, R Radial 11/13 >> will take this out PIV x3  ASSESSMENT / PLAN:  PULMONARY A: Intubated  extubated  P:   IS  Ambulate as surgeons allow  CARDIOVASCULAR A:  NSTEMI - mild troponin elevation PAD HLD sepsis P:  Telemetry monitoring -dc  At baseline BP  RENAL A:   Hypokalemia Resolved hyponatremia P:   kvo saline k supp bmet in am   GASTROINTESTINAL A:   No acute issues  P:   diet Pepcid dc if not home med, add home ppi  HEMATOLOGIC A:   DVt prevention  P:  Limit blood draws Sub q heparin, continued until ambulation  INFECTIOUS A:   Necrotizing fasciitis or R buttock Culture neg P:   Narrow to ceftriaxone and flagyl pcn allergy sketchy but avoid Unasyn for now Stop dates added  ENDOCRINE A:   HHNK resolved   P:   kvo ssi lantus remain  NEUROLOGIC A:   Stroke like symptoms - R sided weakness Sedation with intubation   P:   OT /PT Ambulate soon Pain control   FAMILY  - Updates: no family at bedside  . Pt updated  - Inter-disciplinary family meet or Palliative Care meeting due by:  11/20  To med floor triad  Lavon Paganini.  Titus Mould, MD, [place] Pgr: [facility] 10/02/2016 10:04 AM

## 2016-10-02 NOTE — Care Management Note (Addendum)
Case Management Note  Patient Details  Name: [name] MRN: [id number] Date of Birth: [birth year]  Subjective/Objective:         Admitted post I&D for right buttock wound           Action/Plan:  PTA from home with husband.  Both husband and pt state the wound started as a small spot that was swollen and red around the edges -  pt has had a mostly sedentary life style recently per husband.   Pt needs assistance from husband for ADLs.  Husband can provide care and supervision at discharge. - both pt and husband are open to SNF if recommended.  PT eval ordered.  CM will continue to follow for discharge needs   Expected Discharge Date:                  Expected Discharge Plan:  [place]  In-House Referral:     Discharge planning Services  CM Consult  Post Acute Care Choice:    Choice offered to:  Patient  DME Arranged:  Vac DME Agency:  [facility]  HH Arranged:   RN [place] Agency:  [place]  Status of Service:  In process, will continue to follow  If discussed at Long Length of Stay Meetings, dates discussed:    Additional Comments: 10/02/2016  CM contacted by PA with surgery; surgery team prefers to go with [facility].  [facility] made aware.  [facility] form placed on shadow chart and CM requested PA to order DME, HH along with completion of prescription form specific for [facility].  CM informed pt of DME supplier change.  CM contacted [facility] liason and informed of pending referral.  CM also contacted [facility] and provided referral for [facility]- referral accepted  Pt eval pending to determine safe discharge plan.  Per Surgery; plan is for pt to have wound vac post discharge and per PA are willing to go with [facility] for wound vac equipment if she discharges home.  CM offered choice to pt for DME company and pt chose [facility].  PA to verify with Dr Dalbert Batman to ensure [facility] is acceptable.  CM contacted [facility] DME liaison and informed of pending referral.  CM requested service to complete and sign the wound vac  prescription, place an order for both vac and [facility] - prescription form placed on shadow chart.  Pt is not stable for discharge at this time.  Pt offered choice for HH and chose [facility] - CM awaiting orders prior to completing referral - CM requested orders via physician sticky note  Pt has wound vac applied yesterday.  CM paged PA Brooke with surgery to inquire will pt likely discharge home with  [name], RN 10/02/2016, 2:41 PM

## 2016-10-02 NOTE — Evaluation (Signed)
Physical Therapy Evaluation Patient Details Name: [name] MRN: [id number] DOB: 1966/01/10 Today's Date: 10/02/2016   History of Present Illness  50 year old female with past medical history as below, which is significant for asthma, diabetes, peripheral artery disease, and GERD. Presents with [name] weakness in setting of necrotizing fasciaitis of buttocks, s/p I&D with wound vac application.   Clinical Impression  Patient demonstrates deficits in functional mobility as indicated below. Will need continued skilled PT to address deficits and maximize function. Will see as indicated and progress as tolerated. Recommend ST SNF upon acute discharge to address weakness and improve overall functional mobility.     Follow Up Recommendations SNF;Supervision/Assistance - 24 hour    Equipment Recommendations  None recommended by PT    Recommendations for Other Services       Precautions / Restrictions Precautions Precautions: Fall Precaution Comments: wound Vac      Mobility  Bed Mobility Overal bed mobility: Needs Assistance Bed Mobility: Supine to Sit     Supine to sit: Mod assist     General bed mobility comments: assist for Le movement to EOB [name], moderate assist to power trunk up to upright position  Transfers Overall transfer level: Needs assistance   Transfers: Sit to/from Stand;Stand Pivot Transfers Sit to Stand: Min assist;+2 physical assistance;+2 safety/equipment Stand pivot transfers: Min assist;+2 physical assistance;+2 safety/equipment       General transfer comment: Min assist fo stability, +2 for comfort due to increased anxiety  Ambulation/Gait             General Gait Details: deferred at this time  Stairs            Wheelchair Mobility    Modified Rankin (Stroke Patients Only)       Balance Overall balance assessment: Needs assistance   Sitting balance-Leahy Scale: Fair     Standing balance support: Bilateral upper extremity  supported Standing balance-Leahy Scale: Poor Standing balance comment: limited by anxiety and instability                             Pertinent Vitals/Pain Pain Assessment: 0-10 Pain Score: 7  Pain Location: back and buttocks Pain Descriptors / Indicators: Sore Pain Intervention(s): Monitored during session    Home Living Family/patient expects to be discharged to:: Private residence Living Arrangements: Spouse/significant other Available Help at Discharge: Family Type of Home: House Home Access: Ramped entrance     Home Layout: One level Home Equipment: Environmental consultant - 2 wheels;Cane - single point;Bedside commode;Walker - 4 wheels      Prior Function Level of Independence: Independent               Hand Dominance   Dominant Hand: Right    Extremity/Trunk Assessment   Upper Extremity Assessment: Defer to OT evaluation           Lower Extremity Assessment: Generalized weakness [name] Deficits / Details: gneralized weakness greater in [name] compared to left       Communication   Communication: No difficulties  Cognition Arousal/Alertness: Awake/alert Behavior During Therapy: Anxious Overall Cognitive Status: Within Functional Limits for tasks assessed                      General Comments      Exercises     Assessment/Plan    PT Assessment Patient needs continued PT services  PT Problem List Decreased strength;Decreased activity tolerance;Decreased balance;Decreased mobility;Pain  PT Treatment Interventions DME instruction;Gait training;Functional mobility training;Therapeutic activities;Therapeutic exercise;Balance training;Patient/family education    PT Goals (Current goals can be found in the Care Plan section)  Acute Rehab PT Goals Patient Stated Goal: to get better PT Goal Formulation: With patient Time For Goal Achievement: 10/16/16 Potential to Achieve Goals: Good    Frequency Min 2X/week   Barriers to discharge         Co-evaluation               End of Session Equipment Utilized During Treatment: Gait belt (woudn vac) Activity Tolerance: Patient limited by pain Patient left: in chair;with call bell/phone within reach;with chair alarm set Nurse Communication: Mobility status         Time: 6579-0383 PT Time Calculation (min) (ACUTE ONLY): 18 min   Charges:   PT Evaluation $PT Eval Moderate Complexity: 1 Procedure     PT G Codes:        Duncan Dull 10-03-16, 6:28 PM Alben Deeds, [place] DPT  [phone]

## 2016-10-02 NOTE — Progress Notes (Signed)
[place] Progress Note Patient Name: [name] DOB: 1966/02/04 MRN: [id number]   Date of Service  10/02/2016  HPI/Events of Note  N/V - Refractory to Zofran. QTc interval = 0.3.  eICU Interventions  Will order: 1. Reglan 5 mg IV Q 6 hours PRN N/V. 2. Monitor QTc interval Q 6 hours. Notify MD if QTc interval > 500 milliseconds.      Intervention Category Intermediate Interventions: Other:  Lysle Dingwall 10/02/2016, 10:43 PM

## 2016-10-02 NOTE — Progress Notes (Signed)
Patient ID: [name], female   DOB: Nov 13, 1966, 50 y.o.   MRN: [id number]  [facility] Surgery Progress Note  2 Days Post-Op  Subjective: Patient complaining of significant right buttock pain; lyrica and fentanyl were started last night. Tolerating diet.  Objective: Vital signs in last 24 hours: Temp:  [97.7 F (36.5 C)-99.5 F (37.5 C)] 97.9 F (36.6 C) (11/15 0448) Pulse Rate:  [48-111] 76 (11/15 0600) Resp:  [14-26] 20 (11/15 0600) BP: (82-119)/(45-94) 97/45 (11/15 0600) SpO2:  [90 %-100 %] 92 % (11/15 0600) Arterial Line BP: (96-122)/(50-64) 101/58 (11/14 1200) FiO2 (%):  [40 %] 40 % (11/14 0820) Weight:  [162 lb 7.7 oz (73.7 kg)] 162 lb 7.7 oz (73.7 kg) (11/15 0400) Last BM Date: 10/01/16  Intake/Output from previous day: 11/14 0701 - 11/15 0700 In: 3601.8 [I.V.:2486.8; IV Piggyback:1115] Out: 2800 [Urine:2800] Intake/Output this shift: No intake/output data recorded.  PE: Gen:  Alert, NAD, pleasant Card:  RRR Pulm:  CTAB Abd: Soft, NT/ND, +BS Skin: right buttock wound s/p I&D with wound vac in place, no surrounding erythema   Lab Results:   Recent Labs  10/01/16 0400 10/02/16 0209  WBC 24.1* 17.6*  HGB 12.5 12.2  HCT 36.4 36.5  PLT 340 304   BMET  Recent Labs  10/01/16 1927 10/02/16 0209  NA 136 135  K 2.8* 3.4*  CL 102 101  CO2 25 25  GLUCOSE 159* 154*  BUN <5* <5*  CREATININE 0.43* 0.39*  CALCIUM 7.8* 7.6*   PT/INR  Recent Labs  09/30/16 1426  LABPROT 17.9*  INR 1.46   CMP     Component Value Date/Time   NA 135 10/02/2016 0209   K 3.4 (L) 10/02/2016 0209   CL 101 10/02/2016 0209   CO2 25 10/02/2016 0209   GLUCOSE 154 (H) 10/02/2016 0209   BUN <5 (L) 10/02/2016 0209   CREATININE 0.39 (L) 10/02/2016 0209   CALCIUM 7.6 (L) 10/02/2016 0209   PROT 6.9 09/30/2016 1400   ALBUMIN 2.4 (L) 09/30/2016 1400   AST 46 (H) 09/30/2016 1400   ALT 14 09/30/2016 1400   ALKPHOS 112 09/30/2016 1400   BILITOT 0.9 09/30/2016 1400    GFRNONAA >60 10/02/2016 0209   GFRAA >60 10/02/2016 0209   Lipase     Component Value Date/Time   LIPASE 18 09/30/2016 1426       Studies/Results: Dg Chest 2 View  Result Date: 09/30/2016 CLINICAL DATA:  RIGHT-sided weakness, chills, subjective fever. RIGHT gluteal infection and severe pain. Fall 2 months ago. History of diabetes. EXAM: CHEST  2 VIEW COMPARISON:  Chest radiograph June 27, 2011 FINDINGS: Cardiomediastinal silhouette is normal. Mild bronchitic changes. No pleural effusions or focal consolidations. Trachea projects midline and there is no pneumothorax. Soft tissue planes and included osseous structures are non-suspicious. Surgical clips in the included right abdomen compatible with cholecystectomy. IMPRESSION: Mild bronchitic changes. Electronically Signed   By: Elon Alas M.D.   On: 09/30/2016 14:20   Ct Head Wo Contrast  Result Date: 09/30/2016 CLINICAL DATA:  50 year old female with right side weakness, right arm numbness, "right eye droop". Initial encounter. EXAM: CT HEAD WITHOUT CONTRAST TECHNIQUE: Contiguous axial images were obtained from the base of the skull through the vertex without intravenous contrast. COMPARISON:  Face CT without contrast 02/14/2011. Brain MRI 09/14/2007. FINDINGS: Brain: No midline shift, ventriculomegaly, mass effect, evidence of mass lesion, intracranial hemorrhage or evidence of cortically based acute infarction. Gray-white matter differentiation is within normal limits throughout the  brain. Vascular: Calcified atherosclerosis at the skull base. No suspicious intracranial vascular hyperdensity. Skull: No osseous abnormality identified. Sinuses/Orbits: Mild bubbly opacity and mucosal thickening in the posterior right ethmoid air cell. Other Visualized paranasal sinuses and mastoids are stable and well pneumatized. Other: Abnormal right globe with posterior hyperdensity with a v-shaped extension toward the lens (series 2, image 5). The  right globe was normal on prior studies. The other visible bilateral orbits soft tissues are normal. Visualized scalp soft tissues are within normal limits. IMPRESSION: 1. Abnormal right globe, new since 2012. Top differential considerations include right retinal tumor, retinal tear, or other vitreous hemorrhage. The appearance is not typical of retinal or choroidal detachment, or post treatment changes to the globe. 2.  Normal noncontrast CT appearance of the brain. 3. Mild acute inflammation of the posterior right ethmoid air cell. Electronically Signed   By: Genevie Ann M.D.   On: 09/30/2016 15:27   Ct Abdomen Pelvis W Contrast  Result Date: 09/30/2016 CLINICAL DATA:  50 year old female with right sided extremity weakness, chills, subjective fevers and right gluteus infection with severe pain. EXAM: CT ABDOMEN AND PELVIS WITH CONTRAST TECHNIQUE: Multidetector CT imaging of the abdomen and pelvis was performed using the standard protocol following bolus administration of intravenous contrast. CONTRAST:  119mL ISOVUE-300 IOPAMIDOL (ISOVUE-300) INJECTION 61% COMPARISON:  03/01/2008 CT angiogram of the abdomen and pelvis. FINDINGS: Lower chest: Hypoventilatory changes in the dependent lung bases. Coronary atherosclerosis. Hepatobiliary: Normal liver size. Hypodense subcapsular 1.2 cm liver lesion in the far inferior right liver lobe (series 2/ image 41), new since 03/01/2008. No additional liver lesions. Cholecystectomy. No biliary ductal dilatation. Pancreas: Normal, with no mass or duct dilation. Spleen: Normal size spleen. There are 5 hypodense lesions scattered throughout the spleen, largest 1.4 cm, not easily compared to the 03/01/2008 CT study, which was performed at a different phase of contrast. Adrenals/Urinary Tract: Normal adrenals. There is symmetric fullness of the central renal collecting systems without overt hydronephrosis. No renal mass. Distended bladder without bladder wall thickening.  Stomach/Bowel: Grossly normal stomach. Normal caliber small bowel with no small bowel wall thickening. Normal appendix. Normal large bowel with no diverticulosis, large bowel wall thickening or pericolonic fat stranding. Vascular/Lymphatic: Atherosclerotic nonaneurysmal abdominal aorta. Right external iliac artery and right common femoral artery stents are in place. Apparent occlusion of the right common iliac artery. Patent portal, hepatic, splenic and renal veins. No pathologically enlarged lymph nodes in the abdomen or pelvis. Reproductive: Grossly normal uterus.  No adnexal mass. Other: No pneumoperitoneum, ascites or focal fluid collection. There is extensive soft tissue gas in the subcutaneous and gluteal muscle tissues of the right lower buttock. There is extensive soft tissue gas extending into the right ischiorectal fat and right hip adductor muscles. No focal fluid collections. Musculoskeletal: No aggressive appearing focal osseous lesions. Status post bilateral posterior spinal fusion at L5-S1 with bone cages in the L4-5 and L5-S1 disc spaces. Healed deformity in the medial right iliac bone from bone graft harvest defect. Mild thoracolumbar spondylosis. IMPRESSION: 1. Extensive soft tissue gas in the subcutaneous soft tissues and gluteal muscles of the right lower buttock. Extensive soft tissue gas extends into the right ischiorectal fat and right hip adductor muscles. No focal fluid collections. Please note that the inferior extent of this process is not demonstrated on this CT study. These findings are worrisome for necrotizing fasciitis and urgent surgical consultation is advised. 2. 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 3.  Symmetric fullness of the renal collecting systems without overt hydronephrosis, probably due to bladder distention. Correlate clinically to exclude bladder outlet obstruction. 4. **An incidental finding of potential  clinical significance has been found. New subcapsular low-attenuation inferior right liver lobe lesion, indeterminate. Several small low-attenuation splenic lesions, not definitely seen on 2009 CT, indeterminate. MRI of the abdomen without and with IV contrast is recommended on a short term outpatient basis. ** 5. Coronary atherosclerosis. These results were called by telephone at the time of interpretation on 09/30/2016 at 3:45 pm to Dr. Marda Stalker , who verbally acknowledged these results. Electronically Signed   By: Ilona Sorrel M.D.   On: 09/30/2016 15:46   Dg Chest Port 1 View  Result Date: 10/01/2016 CLINICAL DATA:  Acute onset of shortness of breath and respiratory failure. Initial encounter. EXAM: PORTABLE CHEST 1 VIEW COMPARISON:  Chest radiograph performed 09/30/2016 FINDINGS: The patient's endotracheal tube is seen ending 2-3 cm above the carina. An enteric tube is noted extending below the diaphragm. The lungs are well-aerated. Minimal left basilar atelectasis is noted. There is no evidence of pleural effusion or pneumothorax. The cardiomediastinal silhouette is borderline normal in size. No acute osseous abnormalities are seen. IMPRESSION: 1. Endotracheal tube seen ending 2-3 cm above the carina. 2. Minimal left basilar atelectasis noted. Electronically Signed   By: Garald Balding M.D.   On: 10/01/2016 01:48   Dg Abd Portable 1v  Result Date: 10/01/2016 CLINICAL DATA:  Orogastric tube placement.  Initial encounter. EXAM: PORTABLE ABDOMEN - 1 VIEW COMPARISON:  CT of the abdomen and pelvis from 09/30/2016 FINDINGS: The patient's enteric tube is noted ending overlying the body of the stomach. The visualized bowel gas pattern is grossly unremarkable, with a small amount of stool noted in the colon. Clips are noted within the right upper quadrant, reflecting prior cholecystectomy. No acute osseous abnormalities are identified. Lumbosacral fusion hardware is noted. IMPRESSION: Enteric tube  noted ending overlying the body of the stomach. Electronically Signed   By: Garald Balding M.D.   On: 10/01/2016 01:47    Anti-infectives: Anti-infectives    Start     Dose/Rate Route Frequency Ordered Stop   10/01/16 1230  vancomycin (VANCOCIN) IVPB 750 mg/150 ml premix     750 mg 150 mL/hr over 60 Minutes Intravenous Every 8 hours 10/01/16 0826     10/01/16 0300  vancomycin (VANCOCIN) IVPB 750 mg/150 ml premix  Status:  Discontinued     750 mg 150 mL/hr over 60 Minutes Intravenous Every 12 hours 09/30/16 1338 10/01/16 0826   09/30/16 2200  ceFEPIme (MAXIPIME) 1 g in dextrose 5 % 50 mL IVPB     1 g 100 mL/hr over 30 Minutes Intravenous Every 8 hours 09/30/16 1338     09/30/16 1800  clindamycin (CLEOCIN) IVPB 600 mg     600 mg 100 mL/hr over 30 Minutes Intravenous Every 6 hours 09/30/16 1633     09/30/16 1330  levofloxacin (LEVAQUIN) IVPB 750 mg  Status:  Discontinued     750 mg 100 mL/hr over 90 Minutes Intravenous  Once 09/30/16 1323 09/30/16 1328   09/30/16 1330  aztreonam (AZACTAM) 2 g in dextrose 5 % 50 mL IVPB  Status:  Discontinued     2 g 100 mL/hr over 30 Minutes Intravenous  Once 09/30/16 1323 09/30/16 1328   09/30/16 1330  vancomycin (VANCOCIN) IVPB 1000 mg/200 mL premix     1,000 mg 200 mL/hr over 60 Minutes Intravenous  Once 09/30/16 1323 09/30/16  1535   09/30/16 1330  ceFEPIme (MAXIPIME) 2 g in dextrose 5 % 50 mL IVPB     2 g 100 mL/hr over 30 Minutes Intravenous  Once 09/30/16 1328 09/30/16 1535       Assessment/Plan Necrotizing fascitis right buttock Procedure(s): DEBRIDEMENT RIGHT BUTTOCK WOUND 16cm LONG X 11cm WIDE X 3cm DEEP 11/13 Dr. Grandville Silos - POD 2 - gram stain: ABUNDANT WBC PRESENT,BOTH PMN AND MONONUCLEAR ; ABUNDANT GRAM NEGATIVE RODS; ABUNDANT GRAM POSITIVE COCCI IN PAIRS IN CLUSTERS; FEW GRAM POSITIVE RODS - culture/sensitivities pending - WBC trending down, 17.6  Troponin elevation Sepsis Hypokalemia - 3.4,  repleting HHNK PAD HLD Asthma Chronic pain Tobacco abuse   ID - Cefepime 11/13 >>, Vancomycin 11/13 >>, clinda 11/13>> FEN - soft diet VTE - heparin  Plan - I have contacted [facility] to evaluate wound vac today to ensure that it is working properly. Continue antibiotics, follow culture/sensitivities. Patient is now extubated and tolerating diet. PT eval pending   LOS: 2 days    Jerrye Beavers , [facility] Surgery 10/02/2016, 7:29 AM Pager: [phone] Consults: [phone] Mon-Fri 7:00 am-4:30 pm Sat-Sun 7:00 am-11:30 am

## 2016-10-03 ENCOUNTER — Inpatient Hospital Stay (HOSPITAL_COMMUNITY): Payer: Medicare Other

## 2016-10-03 DIAGNOSIS — R079 Chest pain, unspecified: Secondary | ICD-10-CM

## 2016-10-03 DIAGNOSIS — I248 Other forms of acute ischemic heart disease: Secondary | ICD-10-CM | POA: Diagnosis present

## 2016-10-03 DIAGNOSIS — R531 Weakness: Secondary | ICD-10-CM

## 2016-10-03 DIAGNOSIS — E784 Other hyperlipidemia: Secondary | ICD-10-CM

## 2016-10-03 DIAGNOSIS — D4981 Neoplasm of unspecified behavior of retina and choroid: Secondary | ICD-10-CM

## 2016-10-03 DIAGNOSIS — E785 Hyperlipidemia, unspecified: Secondary | ICD-10-CM | POA: Diagnosis present

## 2016-10-03 LAB — BASIC METABOLIC PANEL
ANION GAP: 10 (ref 5–15)
BUN: <5 mg/dL — ABNORMAL LOW (ref 6–20)
CHLORIDE: 100 mmol/L — AB (ref 101–111)
CO2: 29 mmol/L (ref 22–32)
Calcium: 8.2 mg/dL — ABNORMAL LOW (ref 8.9–10.3)
Creatinine, Ser: 0.52 mg/dL (ref 0.44–1.00)
GFR calc Af Amer: >60 mL/min (ref >60)
GFR calc non Af Amer: >60 mL/min (ref >60)
GLUCOSE: 132 mg/dL — AB (ref 65–99)
POTASSIUM: 3.5 mmol/L (ref 3.5–5.1)
Sodium: 139 mmol/L (ref 135–145)

## 2016-10-03 LAB — GLUCOSE, CAPILLARY
GLUCOSE-CAPILLARY: 142 mg/dL — AB (ref 65–99)
GLUCOSE-CAPILLARY: 228 mg/dL — AB (ref 65–99)
GLUCOSE-CAPILLARY: 163 mg/dL — AB (ref 65–99)
GLUCOSE-CAPILLARY: 174 mg/dL — AB (ref 65–99)
Glucose-Capillary: 121 mg/dL — ABNORMAL HIGH (ref 65–99)

## 2016-10-03 LAB — TROPONIN I
Troponin I: 0.12 ng/mL (ref <0.03)
Troponin I: 0.18 ng/mL (ref <0.03)
Troponin I: 0.05 ng/mL (ref <0.03)

## 2016-10-03 LAB — CBC
HEMATOCRIT: 38.2 % (ref 36.0–46.0)
Hemoglobin: 12.7 g/dL (ref 12.0–15.0)
MCH: 27.4 pg (ref 26.0–34.0)
MCHC: 33.2 g/dL (ref 30.0–36.0)
MCV: 82.3 fL (ref 78.0–100.0)
Platelets: 331 10*3/uL (ref 150–400)
RBC: 4.64 MIL/uL (ref 3.87–5.11)
RDW: 13.5 % (ref 11.5–15.5)
WBC: 15.2 10*3/uL — AB (ref 4.0–10.5)

## 2016-10-03 LAB — ECHOCARDIOGRAM COMPLETE
HEIGHTINCHES: 67 in
WEIGHTICAEL: 2599.66 [oz_av]

## 2016-10-03 LAB — MAGNESIUM: Magnesium: 1.1 mg/dL — ABNORMAL LOW (ref 1.7–2.4)

## 2016-10-03 MED ORDER — INSULIN ASPART 100 UNIT/ML ~~LOC~~ SOLN
0.0000 [IU] | Freq: Three times a day (TID) | SUBCUTANEOUS | Status: DC
  Administered 2016-10-03: 2 [IU] via SUBCUTANEOUS
  Administered 2016-10-04 (×2): 3 [IU] via SUBCUTANEOUS
  Administered 2016-10-04 – 2016-10-05 (×2): 2 [IU] via SUBCUTANEOUS
  Administered 2016-10-05 (×2): 3 [IU] via SUBCUTANEOUS
  Administered 2016-10-06: 5 [IU] via SUBCUTANEOUS

## 2016-10-03 MED ORDER — SODIUM CHLORIDE 0.9 % IV BOLUS (SEPSIS)
1000.0000 mL | Freq: Once | INTRAVENOUS | Status: AC
  Administered 2016-10-03: 1000 mL via INTRAVENOUS

## 2016-10-03 MED ORDER — POTASSIUM CHLORIDE CRYS ER 10 MEQ PO TBCR
30.0000 meq | EXTENDED_RELEASE_TABLET | ORAL | Status: AC
  Administered 2016-10-03 (×2): 30 meq via ORAL
  Filled 2016-10-03 (×2): qty 1

## 2016-10-03 MED ORDER — WHITE PETROLATUM GEL
Status: AC
  Administered 2016-10-03: 12:00:00
  Filled 2016-10-03: qty 1

## 2016-10-03 MED ORDER — ADULT MULTIVITAMIN W/MINERALS CH
1.0000 | ORAL_TABLET | Freq: Every day | ORAL | Status: DC
  Administered 2016-10-03 – 2016-10-05 (×3): 1 via ORAL
  Filled 2016-10-03 (×3): qty 1

## 2016-10-03 MED ORDER — MAGNESIUM SULFATE 2 GM/50ML IV SOLN
2.0000 g | Freq: Once | INTRAVENOUS | Status: AC
  Administered 2016-10-03: 2 g via INTRAVENOUS
  Filled 2016-10-03: qty 50

## 2016-10-03 MED ORDER — METRONIDAZOLE 500 MG PO TABS
500.0000 mg | ORAL_TABLET | Freq: Three times a day (TID) | ORAL | Status: DC
  Administered 2016-10-03 – 2016-10-06 (×10): 500 mg via ORAL
  Filled 2016-10-03 (×10): qty 1

## 2016-10-03 MED ORDER — MAGNESIUM OXIDE 400 (241.3 MG) MG PO TABS
600.0000 mg | ORAL_TABLET | Freq: Two times a day (BID) | ORAL | Status: AC
  Administered 2016-10-03 (×2): 600 mg via ORAL
  Filled 2016-10-03: qty 2
  Filled 2016-10-03: qty 1.5

## 2016-10-03 MED ORDER — FENTANYL CITRATE (PF) 100 MCG/2ML IJ SOLN: 25.0000 ug | Freq: Four times a day (QID) | INTRAMUSCULAR | Status: DC | PRN

## 2016-10-03 MED ORDER — OXYCODONE HCL 5 MG PO TABS
5.0000 mg | ORAL_TABLET | ORAL | Status: DC | PRN
  Administered 2016-10-03 (×3): 5 mg via ORAL
  Administered 2016-10-04: 10 mg via ORAL
  Administered 2016-10-04: 5 mg via ORAL
  Administered 2016-10-04: 10 mg via ORAL
  Administered 2016-10-04 – 2016-10-05 (×6): 5 mg via ORAL
  Filled 2016-10-03: qty 1
  Filled 2016-10-03 (×2): qty 2
  Filled 2016-10-03: qty 1
  Filled 2016-10-03: qty 2
  Filled 2016-10-03 (×2): qty 1
  Filled 2016-10-03: qty 2
  Filled 2016-10-03 (×4): qty 1
  Filled 2016-10-03: qty 2

## 2016-10-03 MED ORDER — MAGNESIUM SULFATE 4 GM/100ML IV SOLN
4.0000 g | Freq: Once | INTRAVENOUS | Status: DC
  Filled 2016-10-03: qty 100

## 2016-10-03 MED ORDER — GLUCERNA SHAKE PO LIQD
237.0000 mL | Freq: Three times a day (TID) | ORAL | Status: DC
  Administered 2016-10-03 – 2016-10-06 (×7): 237 mL via ORAL

## 2016-10-03 MED ORDER — ACETAMINOPHEN 325 MG PO TABS: 650.0000 mg | ORAL_TABLET | Freq: Four times a day (QID) | ORAL | Status: DC | PRN

## 2016-10-03 NOTE — Progress Notes (Signed)
Pt with drop in BP after getting OOB to chair. BP 70's sys. DrSherral Hammers on unit. INformed. Verbal order given to administer 1L NS fluid bolus. Nursing to administer and continue to monitor.

## 2016-10-03 NOTE — Progress Notes (Signed)
Patient ID: [name], female   DOB: 1966-03-02, 50 y.o.   MRN: [id number] Surgery 11/3 was excisional debridement. Georganna Skeans, MD, MPH, FACS Trauma: [phone] General Surgery: [phone]

## 2016-10-03 NOTE — Progress Notes (Signed)
Dr. Sherral Hammers paged to make aware of troponin 0.18

## 2016-10-03 NOTE — Progress Notes (Signed)
Report received from  Reid Hope King. RN stated that patient's baseline BP is in the 98Y systolic per patient report.

## 2016-10-03 NOTE — Progress Notes (Signed)
Patient arrived to room alert and oriented. Wound vac to buttock and dressing reinforced with tegaderm. Minimal output and ICU RN reported that has been the "same output since surgery". Wound to right shin and right ankle. Patient and husband oriented to room and plan of care. Patient denies associated symptoms of her blood pressure and she and her husband state "that is normal for me I feel fine". Patient placed on cardiac monitor.

## 2016-10-03 NOTE — Clinical Social Work Note (Signed)
Clinical Social Work Assessment  Patient Details  Name: [name] MRN: [id number] Date of Birth: February 13, 1966  Date of referral:  10/03/16               Reason for consult:  Facility Placement, Discharge Planning                Permission sought to share information with:  Family Supports Permission granted to share information::  Yes, Verbal Permission Granted  Name::     Darlette Dubow (husband) [phone]  Agency::     Relationship::     Contact Information:     Housing/Transportation Living arrangements for the past 2 months:  Single Family Home Source of Information:  Patient, Spouse Patient Interpreter Needed:  None Criminal Activity/Legal Involvement Pertinent to Current Situation/Hospitalization:  No - Comment as needed Significant Relationships:  Friend, Spouse, Other Family Members Lives with:  Spouse Do you feel safe going back to the place where you live?  Yes Need for family participation in patient care:  Yes (Comment)  Care giving concerns:  Patient has a wound vac and will need IV antibiotics at discharge. PT recommending skilled nursing facility placement. Patient reports that her husband and other family can provide care for her at home. Patient adamently refusing skilled nursing facility placement at this time.    Social Worker assessment / plan:  Patient is 50 YO Caucasian female presenting with necrotizing fasciitis. CSW engaged with Patient at her bedside and introduced self, role of CSW, and began discussing discharge placement. CSW explained PT's recommendation that patient needs continued skilled PT to address deficits and maximize function. PT recommends ST SNF upon acute discharge to address weakness and improve overall functional mobility. Patient has a wound vac and will need IV antibiotics at discharge. Patient adamently refusing skilled nursing facility placement at this time. Patient reports that if her husband sends her to a nursing home facility--  "I will divorce him and get him for everything he has." Patient reports that when she married her husband, he promised not to put her in a facility and that they vowed "through sickness and through health". Attending MD and RN Case Manager also presented during assessment to discuss the severity of Patient's condition and the need to go to SNF at discharge. Patient continues to refuse. CSW also engaged with Patient's husband outside of room. Patient's husband expressed that he will talk to his wife and let CSW and RN Case Manager know decided discharge plan. CSW will continue to follow for disposition.     Employment status:  Disabled (Comment on whether or not currently receiving Disability) Insurance information:  Managed Medicare PT Recommendations:  [place] / Referral to community resources:  [place]  Patient/Family's Response to care:  Patient appreciative of care received at this time however Patient adamently refusing skilled nursing facility placement at this time.    Patient/Family's Understanding of and Emotional Response to Diagnosis, Current Treatment, and Prognosis:  Patient verbalizes a strong understanding of her diagnosis and prognosis however, despite MD education and RN education, Patient does not appear to understand the severity of her condition and the need for short term nursing home placement. Patient's husband reports an understanding of SNF placement recommendation and is in agreement to placement.    Emotional Assessment Appearance:  Appears stated age Attitude/Demeanor/Rapport:   (Engaging; Cooperative) Affect (typically observed):  Stable, Calm, Hopeful Orientation:  Oriented to Self, Oriented to Place, Oriented to  Time, Oriented  to Situation Alcohol / Substance use:  Not Applicable Psych involvement (Current and /or in the community):  No (Comment)  Discharge Needs  Concerns to be addressed:  Patient refuses services,  Discharge Planning Concerns, Care Coordination Readmission within the last 30 days:  No Current discharge risk:  None Barriers to Discharge:  Continued Medical Work up   [employer], LCSW 10/03/2016, 11:49 AM

## 2016-10-03 NOTE — Progress Notes (Signed)
PROGRESS NOTE    [name]  NAT:[id number] DOB: 1966/07/07 DOA: 09/30/2016 PCP: [name], MD   Brief Narrative:   50yo WF PMHx Depression,Diabetes mellitus2 uncontrolled with complications, Foot Ulcer,Asthma, HLD, Chronic back pain,  Presented to [facility] earlier today complaining of RUE/[name] weakness and numbness and tingling. Concerned that she was having a stroke, her husband brought her to the ED. She also reports noticing an ulcer on her right buttock about 1 month ago; she put some ointment on it and it resolved, but then about 1 week ago it returned and got worse. She reports severe pain in the area as well as erythema and drainage. She also reports associated nausea, fevers, chills, and diarrhea.  Last meal was soup last night   Subjective: 11/16 A/O 4, sitting in chair comfortably. States the lesion on right buttocks began as a sore ~6-8 weeks ago. States had been treating with antibiotic cream with the lesion resolving and then returning. States unfortunately this time lesion returned and she began to have subjective fever and chills. Patient also has lacerations on [name] states ran into furniture.    Assessment & Plan:   Active Problems:   Wound infection   Necrotizing fasciitis ([place])   Hyperglycemia   Pressure injury of skin   Acute respiratory failure ([place])   NSTEMI?. Demand ischemia - mild troponin elevation. Troponin elevation NOT consistent with NSTEMI, most likely secondary to sepsis/necrotizing fasciitis. Will trend.  -Echocardiogram not consistent with ACS/NSTEMI. See results below  PAD  HLD  Sepsis/Necrotizing fasciitis or Rt buttock(positive strep viridans)  -continue wound vac (scheduled changes MWF): Patient REFUSES SNF. Counseled would be in her best interest to be discharged to SNF -Per surgery ready for discharge on 11/17 -Discontinue all IV pain medication in preparation for discharge on 11/17 -Continue antibiotics as below. -Prior to discharge  speak with ID. Can patient be discharged on oral antibiotics or does she require IV antibiotics for 10 days?  [name] lateral malleolus laceration with Eschar -Purulent material expressed with palpation. X-ray DIDN'T NOT show Osteomyelitis  Right side weakness: Acute CVA?/Retinal Tumor -CT scan negative for CVA however may show retinal tumor. See results below -On 11/17 consult Neurology/Neurosurgery specific MRI recommended for further evaluation       DVT prophylaxis: Subcutaneous heparin Code Status: Full Family Communication: None Disposition Plan: Dependent upon neoplasm workup   Consultants:  Dr.Daniel Lily Kocher, PCCM Dr. Ihor Dow surgery    Procedures/Significant Events:  11/13- to [place], sepsis 11/13 CT Head Wo contrast:- Abnormal right globe, new since 2012. Top differential considerations include right retinal tumor, retinal tear, or other vitreous hemorrhage.  -Negative acute CVA 11/16 Echocardiogram:Left ventricle: mild LVH. -LVEF =50% to 55%.   VENTILATOR SETTINGS:    Cultures Blood 11/13 > NGTD Urine 11/13 > positive multiple species Wound 11/13 > positive strep viridans  Antimicrobials: Cefepime 11/13 >>>11/15 Vancomycin 11/13 >11/15 clinda 11/13>>>11/15 Ceftriaxone 11/15>>>10 days total Flagyl 11/15>>>total 7 days anerobic    Devices    LINES / TUBES:  ETT 11/13 >> 11/14 Art Line, R Radial 11/13 >> will take this out PIV x3    Continuous Infusions: . 0.9 % NaCl with KCl 20 mEq / L 50 mL/hr at 10/02/16 1627     Objective: Vitals:   10/03/16 0320 10/03/16 0400 10/03/16 0500 10/03/16 0808  BP:  110/71 115/67   Pulse:  (!) 108 (!) 109   Resp:  19 15   Temp: 98.3 F (36.8 C)  98.6 F (37 C)  TempSrc: Oral   Oral  SpO2:  94% 94%   Weight:      Height:        Intake/Output Summary (Last 24 hours) at 10/03/16 0816 Last data filed at 10/03/16 0600  Gross per 24 hour  Intake             1375 ml  Output              2000 ml  Net             -625 ml   Filed Weights   09/30/16 1209 09/30/16 2016 10/02/16 0400  Weight: 68.5 kg (151 lb) 68.2 kg (150 lb 5.7 oz) 73.7 kg (162 lb 7.7 oz)    Examination:  General: A/O 4, appropriate discomfort for recent debridement. No acute respiratory distress Eyes: negative scleral hemorrhage, negative anisocoria, negative icterus ENT: Negative Runny nose, negative gingival bleeding, Neck:  Negative scars, masses, torticollis, lymphadenopathy, JVD Lungs: Clear to auscultation bilaterally without wheezes or crackles Cardiovascular: Regular rate and rhythm without murmur gallop or rub normal S1 and S2 Abdomen: negative abdominal pain, nondistended, positive soft, bowel sounds, no rebound, no ascites, no appreciable mass Extremities: No significant cyanosis, clubbing, or edema bilateral lower extremities. Right ankle lateral malleolus Purulent material expressed with palpation. Right shin laceration negative sign of infection. Right buttock with wound VAC in place, minimal serosanguineous fluid. Psychiatric:  Negative depression, negative anxiety, negative fatigue, negative mania, positive flat affect  Central nervous system:  Cranial nerves II through XII intact, tongue/uvula midline, all extremities muscle strength 5/5, sensation intact throughout, negative dysarthria, negative expressive aphasia, negative receptive aphasia.  .     Data Reviewed: Care during the described time interval was provided by me .  I have reviewed this patient's available data, including medical history, events of note, physical examination, and all test results as part of my evaluation. I have personally reviewed and interpreted all radiology studies.  CBC:  Recent Labs Lab 09/30/16 1226 09/30/16 1954 10/01/16 0400 10/02/16 0209 10/03/16 0041  WBC 27.4*  --  24.1* 17.6* 15.2*  NEUTROABS 24.4*  --   --   --   --   HGB 15.1* 11.2* 12.5 12.2 12.7  HCT 43.3 33.0* 36.4 36.5 38.2    MCV 81.4  --  81.3 82.6 82.3  PLT 233  --  340 304 626   Basic Metabolic Panel:  Recent Labs Lab 09/30/16 1743  09/30/16 2350 10/01/16 0400 10/01/16 1927 10/02/16 0209 10/03/16 0041  NA 130*  < > 137 139 136 135 139  K 3.2*  < > 2.9* 3.6 2.8* 3.4* 3.5  CL 94*  < > 105 108 102 101 100*  CO2 22  < > 24 23 25 25 29   GLUCOSE 547*  < > 310* 268* 159* 154* 132*  BUN 11  < > 6 7 <5* <5* <5*  CREATININE 0.72  < > 0.53 0.45 0.43* 0.39* 0.52  CALCIUM 8.5*  < > 7.6* 7.6* 7.8* 7.6* 8.2*  MG 1.3*  --   --   --   --   --  1.1*  < > = values in this interval not displayed. GFR: Estimated Creatinine Clearance: 80 mL/min (by C-G formula based on SCr of 0.52 mg/dL). Liver Function Tests:  Recent Labs Lab 09/30/16 1400  AST 46*  ALT 14  ALKPHOS 112  BILITOT 0.9  PROT 6.9  ALBUMIN 2.4*    Recent Labs Lab  09/30/16 1426  LIPASE 18   No results for input(s): AMMONIA in the last 168 hours. Coagulation Profile:  Recent Labs Lab 09/30/16 1426  INR 1.46   Cardiac Enzymes:  Recent Labs Lab 09/30/16 1743  TROPONINI 0.09*   BNP (last 3 results) No results for input(s): PROBNP in the last 8760 hours. HbA1C: No results for input(s): HGBA1C in the last 72 hours. CBG:  Recent Labs Lab 10/02/16 1517 10/02/16 1948 10/02/16 2324 10/03/16 0323 10/03/16 0805  GLUCAP 162* 122* 126* 121* 142*   Lipid Profile:  Recent Labs  09/30/16 2058  TRIG 247*   Thyroid Function Tests: No results for input(s): TSH, T4TOTAL, FREET4, T3FREE, THYROIDAB in the last 72 hours. Anemia Panel: No results for input(s): VITAMINB12, FOLATE, FERRITIN, TIBC, IRON, RETICCTPCT in the last 72 hours. Urine analysis:    Component Value Date/Time   COLORURINE YELLOW 09/30/2016 1547   APPEARANCEUR CLOUDY (A) 09/30/2016 1547   LABSPEC 1.027 09/30/2016 1547   PHURINE 5.0 09/30/2016 1547   GLUCOSEU >1000 (A) 09/30/2016 1547   HGBUR MODERATE (A) 09/30/2016 1547   BILIRUBINUR NEGATIVE 09/30/2016 1547    KETONESUR NEGATIVE 09/30/2016 1547   PROTEINUR NEGATIVE 09/30/2016 1547   UROBILINOGEN 0.2 06/25/2011 1707   NITRITE NEGATIVE 09/30/2016 1547   LEUKOCYTESUR NEGATIVE 09/30/2016 1547   Sepsis Labs: @LABRCNTIP (procalcitonin:4,lacticidven:4)  ) Recent Results (from the past 240 hour(s))  Blood Culture (routine x 2)     Status: None (Preliminary result)   Collection Time: 09/30/16  1:29 PM  Result Value Ref Range Status   Specimen Description BLOOD LEFT FOREARM  Final   Special Requests IN PEDIATRIC BOTTLE  3CC  Final   Culture NO GROWTH 2 DAYS  Final   Report Status PENDING  Incomplete  Blood Culture (routine x 2)     Status: None (Preliminary result)   Collection Time: 09/30/16  1:39 PM  Result Value Ref Range Status   Specimen Description BLOOD LEFT FOREARM  Final   Special Requests IN PEDIATRIC BOTTLE  1CC  Final   Culture NO GROWTH 2 DAYS  Final   Report Status PENDING  Incomplete  Urine culture     Status: Abnormal   Collection Time: 09/30/16  3:48 PM  Result Value Ref Range Status   Specimen Description URINE, RANDOM  Final   Special Requests NONE  Final   Culture MULTIPLE SPECIES PRESENT, SUGGEST RECOLLECTION (A)  Final   Report Status 10/01/2016 FINAL  Final  Aerobic/Anaerobic Culture (surgical/deep wound)     Status: None (Preliminary result)   Collection Time: 09/30/16  6:58 PM  Result Value Ref Range Status   Specimen Description WOUND  Final   Special Requests RIGHT BUTTOCKS  Final   Gram Stain   Final    ABUNDANT WBC PRESENT,BOTH PMN AND MONONUCLEAR ABUNDANT GRAM NEGATIVE RODS ABUNDANT GRAM POSITIVE COCCI IN PAIRS IN CLUSTERS FEW GRAM POSITIVE RODS    Culture   Final    ABUNDANT VIRIDANS STREPTOCOCCUS NO ANAEROBES ISOLATED; CULTURE IN PROGRESS FOR 5 DAYS    Report Status PENDING  Incomplete  MRSA PCR Screening     Status: None   Collection Time: 09/30/16  8:22 PM  Result Value Ref Range Status   MRSA by PCR NEGATIVE NEGATIVE Final    Comment:         The GeneXpert MRSA Assay (FDA approved for NASAL specimens only), is one component of a comprehensive MRSA colonization surveillance program. It is not intended to diagnose MRSA infection nor to  guide or monitor treatment for MRSA infections.          Radiology Studies: No results found.      Scheduled Meds: . cefTRIAXone (ROCEPHIN)  IV  1 g Intravenous Q24H  . heparin subcutaneous  5,000 Units Subcutaneous Q8H  . insulin aspart  2-6 Units Subcutaneous Q4H  . insulin glargine  5 Units Subcutaneous Daily  . metronidazole  500 mg Intravenous Q8H  . pantoprazole  20 mg Oral Daily  . pregabalin  100 mg Oral BID   Continuous Infusions: . 0.9 % NaCl with KCl 20 mEq / L 50 mL/hr at 10/02/16 1627     LOS: 3 days    Time spent: 40 minutes    Brody Kump, Geraldo Docker, MD [name] Pager [phone]   If 7PM-7AM, please contact night-coverage [email or web address] Password [email or web address] 10/03/2016, 8:16 AM

## 2016-10-03 NOTE — Care Management Note (Addendum)
Case Management Note  Patient Details  Name: CARLETTA FEASEL MRN: [id number] Date of Birth: 1966/04/16  Subjective/Objective:                 Patient transferred from [facility]M. Spoke with patient and husband at bedside. Patient from home with husband in [place].  Admitted with buttock wound requiring I&D and wound VAC. Patient and husband in agreement to discharge to home.  CM verified with patient and husband that they understand SNF recommended. Patient and husband state that husband works as EMT once every four days. She would have assistance from family on the days that he works, and would have 24/7 support and supervision at home. Husband in agreement to provide care. Patient states that she has blood sugar meter, testing supplies, WC, walker, rolator, BSC, shower seat, ramp to get into house. She most of her time in [facility] or sitting. Agreed to have [facility] services at DC to follow for wound care, PT, ADL assistance. Choice provided and they would like [facility] for [facility]. Per surgery, likely to switch to oral abx tomorrow.  1530: Wound VAC orders signed and faxed to [facility] 11/17 09:30 Spoke with Alexa at [place] will be delivered to room by end of business today (Friday). WOC to change dressing today, home VAC can be attached at discharge to site.   PCP- [employer], [facility]/ [place]   Action/Plan:  Need: [place] orders and referral.  Expected Discharge Date:                  Expected Discharge Plan:  [place]  In-House Referral:  Clinical Social Work  Discharge planning Services  CM Consult  Post Acute Care Choice:  Home Health, Durable Medical Equipment Choice offered to:  Patient  DME Arranged:  Vac DME Agency:  [facility]  HH Arranged:    [place] Agency:  [place]  Status of Service:  In process, will continue to follow  If discussed at Long Length of Stay Meetings, dates discussed:    Additional Comments:  Carles Collet, RN 10/03/2016, 2:46 PM

## 2016-10-03 NOTE — Progress Notes (Addendum)
Initial Nutrition Assessment  DOCUMENTATION CODES:   Not applicable  INTERVENTION:    Glucerna Shake po TID, each supplement provides 220 kcal and 10 grams of protein  MVI daily.  NUTRITION DIAGNOSIS:   Increased nutrient needs related to wound healing as evidenced by estimated needs.  GOAL:   Patient will meet greater than or equal to 90% of their needs  MONITOR:   PO intake, Supplement acceptance, Skin, Labs, Weight trends  REASON FOR ASSESSMENT:   Malnutrition Screening Tool    ASSESSMENT:   50yo female who presented to [facility] on 11/13 complaining of RUE/[name] weakness and numbness and tingling. S/P I&D of right buttock necrotizing fasciitis on 11/13.  Discussed patient in ICU rounds and with RN today. Patient was extubated on 11/14. Currently on a soft diet. Unable to speak with patient today. Intake has been poor and patient needs increased protein and calories to support wound healing. Will add PO supplement. Labs reviewed: magnesium is low. CBG's: 121-142-228 today Medications reviewed and include Mag-Ox, KCl.  Diet Order:  DIET SOFT Room service appropriate? Yes; Fluid consistency: Thin  Skin:  Wound (see comment) (necrotizing fasciitis R buttock; unstageable pressure injury to R leg)  Last BM:  11/16  Height:   Ht Readings from Last 1 Encounters:  09/30/16 5\' 7"  (1.702 m)    Weight:   Wt Readings from Last 1 Encounters:  10/02/16 162 lb 7.7 oz (73.7 kg)    Ideal Body Weight:  61.4 kg  BMI:  Body mass index is 25.45 kg/m.  Estimated Nutritional Needs:   Kcal:  1900-2100  Protein:  100-115 gm  Fluid:  2 L  EDUCATION NEEDS:   No education needs identified at this time  Molli Barrows, [place], [place], [place] Pager [phone] After Hours Pager [phone]

## 2016-10-03 NOTE — Care Management Note (Addendum)
Case Management Note  Patient Details  Name: [name] MRN: [id number] Date of Birth: [birth year]  Subjective/Objective:         Admitted post I&D for right buttock wound           Action/Plan:  PTA from home with husband.  Both husband and pt state the wound started as a small spot that was swollen and red around the edges -  pt has had a mostly sedentary life style recently per husband.   Pt needs assistance from husband for ADLs.  Husband can provide care and supervision at discharge. - both pt and husband are open to SNF if recommended.  PT eval ordered.  CM will continue to follow for discharge needs   Expected Discharge Date:                  Expected Discharge Plan:  [place]  In-House Referral:     Discharge planning Services  CM Consult  Post Acute Care Choice:    Choice offered to:  Patient  DME Arranged:  Vac DME Agency:  [facility]  HH Arranged:   RN [place] Agency:  [place]  Status of Service:  In process, will continue to follow  If discussed at Long Length of Stay Meetings, dates discussed:    Additional Comments: 10/03/2016   PT recommended SNF - CSW consulted.  CM,CSW and Attending met with pt in room to discuss SNF recommendation.  All resources collaboratively explained the care/equipment that will be required post discharge including wound vac and possible long term IV antibiotics (per attending) - husband works as an Neurosurgeon and can not provide 24 hour supervision.  Pt acknowledged concerns raised but stated she wanted to go home - pt was in agreement for CM and CSW to discuss recommendations with husband.  CM and CSW informed husband of recommendation and husband is also in agreement with discharge to SNF - husband stated he would speak with his wife and CM will follow up  10/02/16 CM contacted by PA with surgery; surgery team prefers to go with [facility].  [facility] made aware.  [facility] form placed on shadow chart and CM requested PA to order DME, HH  along with completion of prescription form specific for [facility].  CM informed pt of DME supplier change.  CM contacted [facility] liason and informed of pending referral.  CM also contacted [facility] and provided referral for [facility]- referral accepted  PT eval pending to determine safe discharge plan.  Per Surgery; plan is for pt to have wound vac post discharge and per PA are willing to go with [facility] for wound vac equipment if she discharges home.  CM offered choice to pt for DME company and pt chose [facility].  PA to verify with Dr Dalbert Batman to ensure [facility] is acceptable.  CM contacted [facility] DME liaison and informed of pending referral.  CM requested service to complete and sign the wound vac prescription, place an order for both vac and [facility] - prescription form placed on shadow chart.  Pt is not stable for discharge at this time.  Pt offered choice for HH and chose [facility] - CM awaiting orders prior to completing referral - CM requested orders via physician sticky note  Pt has wound vac applied yesterday.  CM paged PA Brooke with surgery to inquire will pt likely discharge home with  [name], RN 10/03/2016, 8:45 AM

## 2016-10-03 NOTE — Progress Notes (Signed)
Paged [name] regarding frequent tele ectopy. Orders received.

## 2016-10-03 NOTE — Progress Notes (Signed)
  Echocardiogram 2D Echocardiogram has been performed.  Bobbye Charleston 10/03/2016, 3:28 PM

## 2016-10-03 NOTE — Progress Notes (Signed)
Patient ID: [name], female   DOB: [birth year], 50 y.o.   MRN: [id number]  [facility] Surgery Progress Note  3 Days Post-Op  Subjective: States that back pain is bothering her more than postop pain. Had problem with n/v yesterday, has improved since adding reglan. Reports having a few loose stools yesterday, but this is not abnormal for her; scheduled to see GI outpatient about this.   Objective: Vital signs in last 24 hours: Temp:  [97.8 F (36.6 C)-98.6 F (37 C)] 98.6 F (37 C) (11/16 0808) Pulse Rate:  [36-115] 108 (11/16 0800) Resp:  [12-28] 15 (11/16 0800) BP: (90-127)/(62-80) 110/67 (11/16 0800) SpO2:  [91 %-98 %] 94 % (11/16 0800) Last BM Date: 10/03/16  Intake/Output from previous day: 11/15 0701 - 11/16 0700 In: 1375 [I.V.:1125; IV Piggyback:250] Out: 2000 [Urine:2000] Intake/Output this shift: Total I/O In: 120 [I.V.:120] Out: -   PE: Gen: Alert, NAD, appears in less pain today Pulm: effort normal Abd: Soft, NT/ND Skin: right buttock wound s/p I&D with wound vac in place  Lab Results:   Recent Labs  10/02/16 0209 10/03/16 0041  WBC 17.6* 15.2*  HGB 12.2 12.7  HCT 36.5 38.2  PLT 304 331   BMET  Recent Labs  10/02/16 0209 10/03/16 0041  NA 135 139  K 3.4* 3.5  CL 101 100*  CO2 25 29  GLUCOSE 154* 132*  BUN <5* <5*  CREATININE 0.39* 0.52  CALCIUM 7.6* 8.2*   PT/INR  Recent Labs  09/30/16 1426  LABPROT 17.9*  INR 1.46   CMP     Component Value Date/Time   NA 139 10/03/2016 0041   K 3.5 10/03/2016 0041   CL 100 (L) 10/03/2016 0041   CO2 29 10/03/2016 0041   GLUCOSE 132 (H) 10/03/2016 0041   BUN <5 (L) 10/03/2016 0041   CREATININE 0.52 10/03/2016 0041   CALCIUM 8.2 (L) 10/03/2016 0041   PROT 6.9 09/30/2016 1400   ALBUMIN 2.4 (L) 09/30/2016 1400   AST 46 (H) 09/30/2016 1400   ALT 14 09/30/2016 1400   ALKPHOS 112 09/30/2016 1400   BILITOT 0.9 09/30/2016 1400   GFRNONAA >60 10/03/2016 0041   GFRAA >60 10/03/2016 0041    Lipase     Component Value Date/Time   LIPASE 18 09/30/2016 1426       Studies/Results: No results found.  Anti-infectives: Anti-infectives    Start     Dose/Rate Route Frequency Ordered Stop   10/09/16 1300  cefTRIAXone (ROCEPHIN) 1 g in dextrose 5 % 50 mL IVPB  Status:  Discontinued     1 g 100 mL/hr over 30 Minutes Intravenous Every 24 hours 10/02/16 1012 10/02/16 1022   10/02/16 1300  cefTRIAXone (ROCEPHIN) 1 g in dextrose 5 % 50 mL IVPB     1 g 100 mL/hr over 30 Minutes Intravenous Every 24 hours 10/02/16 1022 10/09/16 1259   10/02/16 1100  metroNIDAZOLE (FLAGYL) IVPB 500 mg     500 mg 100 mL/hr over 60 Minutes Intravenous Every 8 hours 10/02/16 1012 10/07/16 1059   10/01/16 1230  vancomycin (VANCOCIN) IVPB 750 mg/150 ml premix  Status:  Discontinued     750 mg 150 mL/hr over 60 Minutes Intravenous Every 8 hours 10/01/16 0826 10/02/16 1007   10/01/16 0300  vancomycin (VANCOCIN) IVPB 750 mg/150 ml premix  Status:  Discontinued     750 mg 150 mL/hr over 60 Minutes Intravenous Every 12 hours 09/30/16 1338 10/01/16 0826   09/30/16 2200  ceFEPIme (MAXIPIME) 1 g in dextrose 5 % 50 mL IVPB  Status:  Discontinued     1 g 100 mL/hr over 30 Minutes Intravenous Every 8 hours 09/30/16 1338 10/02/16 1011   09/30/16 1800  clindamycin (CLEOCIN) IVPB 600 mg  Status:  Discontinued     600 mg 100 mL/hr over 30 Minutes Intravenous Every 6 hours 09/30/16 1633 10/02/16 1006   09/30/16 1330  levofloxacin (LEVAQUIN) IVPB 750 mg  Status:  Discontinued     750 mg 100 mL/hr over 90 Minutes Intravenous  Once 09/30/16 1323 09/30/16 1328   09/30/16 1330  aztreonam (AZACTAM) 2 g in dextrose 5 % 50 mL IVPB  Status:  Discontinued     2 g 100 mL/hr over 30 Minutes Intravenous  Once 09/30/16 1323 09/30/16 1328   09/30/16 1330  vancomycin (VANCOCIN) IVPB 1000 mg/200 mL premix     1,000 mg 200 mL/hr over 60 Minutes Intravenous  Once 09/30/16 1323 09/30/16 1535   09/30/16 1330  ceFEPIme (MAXIPIME) 2  g in dextrose 5 % 50 mL IVPB     2 g 100 mL/hr over 30 Minutes Intravenous  Once 09/30/16 1328 09/30/16 1535       Assessment/Plan Necrotizing fascitis right buttock Procedure(s): DEBRIDEMENT RIGHT BUTTOCK WOUND 16cm LONG X 11cm WIDE X 3cm DEEP 11/13 Dr. Grandville Silos - POD 3 - culture: ABUNDANT VIRIDANS STREPTOCOCCUS NO ANAEROBES ISOLATED; CULTURE IN PROGRESS FOR 5 DAYS - WBC trending down 15.2, afebrile  Troponin elevation Sepsis Hypokalemia - resolved HHNK PAD HLD Asthma Chronic pain Tobacco abuse   ID- Cefepime 11/13 >>11/15, Vancomycin 11/13 >>11/15, clinda 11/13>>11/15, rocephin 11/15>>, flagyl 11/15>> FEN - soft diet VTE - heparin  Plan - awaiting transfer to SDU. continue wound vac (scheduled changes MWF), will likely be discharged with wound vac (home health orders have been placed). Antibiotics were switched yesterday to rocephin and flagyl. Magnesium being repleted. N/v improved from yesterday. Encourage PO intake. Need to work towards weaning off IV pain medication (decreased fentanyl, added oxycodone and tylenol). Continue PT (patient's goal is to go home rather than to SNF, she does live with her husband who will be able to help care for her). From surgical standpoint Ms. Florence will likely be ready for discharge tomorrow.   LOS: 3 days    Jerrye Beavers , [facility] Surgery 10/03/2016, 8:42 AM Pager: [phone] Consults: [phone] Mon-Fri 7:00 am-4:30 pm Sat-Sun 7:00 am-11:30 am

## 2016-10-04 DIAGNOSIS — T148XXA Other injury of unspecified body region, initial encounter: Secondary | ICD-10-CM

## 2016-10-04 DIAGNOSIS — L089 Local infection of the skin and subcutaneous tissue, unspecified: Secondary | ICD-10-CM

## 2016-10-04 LAB — BASIC METABOLIC PANEL
Anion gap: 11 (ref 5–15)
CHLORIDE: 94 mmol/L — AB (ref 101–111)
CO2: 30 mmol/L (ref 22–32)
Calcium: 8.5 mg/dL — ABNORMAL LOW (ref 8.9–10.3)
Creatinine, Ser: 0.45 mg/dL (ref 0.44–1.00)
GFR calc Af Amer: >60 mL/min (ref >60)
GFR calc non Af Amer: >60 mL/min (ref >60)
GLUCOSE: 223 mg/dL — AB (ref 65–99)
POTASSIUM: 4.4 mmol/L (ref 3.5–5.1)
Sodium: 135 mmol/L (ref 135–145)

## 2016-10-04 LAB — CBC
HEMATOCRIT: 41.9 % (ref 36.0–46.0)
Hemoglobin: 13.8 g/dL (ref 12.0–15.0)
MCH: 27.7 pg (ref 26.0–34.0)
MCHC: 32.9 g/dL (ref 30.0–36.0)
MCV: 84 fL (ref 78.0–100.0)
Platelets: 320 10*3/uL (ref 150–400)
RBC: 4.99 MIL/uL (ref 3.87–5.11)
RDW: 14 % (ref 11.5–15.5)
WBC: 12.4 10*3/uL — AB (ref 4.0–10.5)

## 2016-10-04 LAB — GLUCOSE, CAPILLARY
GLUCOSE-CAPILLARY: 245 mg/dL — AB (ref 65–99)
GLUCOSE-CAPILLARY: 194 mg/dL — AB (ref 65–99)
GLUCOSE-CAPILLARY: 212 mg/dL — AB (ref 65–99)
Glucose-Capillary: 212 mg/dL — ABNORMAL HIGH (ref 65–99)

## 2016-10-04 LAB — MAGNESIUM: Magnesium: 1.3 mg/dL — ABNORMAL LOW (ref 1.7–2.4)

## 2016-10-04 MED ORDER — SODIUM CHLORIDE 0.9 % IV BOLUS (SEPSIS)
500.0000 mL | Freq: Once | INTRAVENOUS | Status: AC
  Administered 2016-10-04: 500 mL via INTRAVENOUS

## 2016-10-04 MED ORDER — MAGNESIUM SULFATE 2 GM/50ML IV SOLN
2.0000 g | Freq: Once | INTRAVENOUS | Status: AC
Start: 2016-10-04 — End: 2016-10-04
  Administered 2016-10-04: 2 g via INTRAVENOUS
  Filled 2016-10-04: qty 50

## 2016-10-04 MED ORDER — CEPHALEXIN 500 MG PO CAPS
500.0000 mg | ORAL_CAPSULE | Freq: Four times a day (QID) | ORAL | Status: DC
  Administered 2016-10-05 – 2016-10-06 (×7): 500 mg via ORAL
  Filled 2016-10-04 (×7): qty 1

## 2016-10-04 MED ORDER — MAGNESIUM OXIDE 400 (241.3 MG) MG PO TABS
400.0000 mg | ORAL_TABLET | Freq: Two times a day (BID) | ORAL | Status: DC
  Administered 2016-10-04 – 2016-10-06 (×5): 400 mg via ORAL
  Filled 2016-10-04 (×5): qty 1

## 2016-10-04 MED ORDER — LOPERAMIDE HCL 2 MG PO CAPS
2.0000 mg | ORAL_CAPSULE | ORAL | Status: DC | PRN
  Administered 2016-10-04 – 2016-10-06 (×5): 2 mg via ORAL
  Filled 2016-10-04 (×6): qty 1

## 2016-10-04 MED ORDER — SODIUM CHLORIDE 0.9 % IV BOLUS (SEPSIS)
1000.0000 mL | Freq: Once | INTRAVENOUS | Status: AC
  Administered 2016-10-04: 1000 mL via INTRAVENOUS

## 2016-10-04 NOTE — Progress Notes (Signed)
4 Days Post-Op  Subjective: Now on MedSurg floor and doing better. Denies pain in right gluteal area. Biggest complaint is chronic back pain following two back surgeries  Cultures growing strep viridans but probably are polymicrobial  Objective: Vital signs in last 24 hours: Temp:  [97.7 F (36.5 C)-98.6 F (37 C)] 97.7 F (36.5 C) (11/17 0623) Pulse Rate:  [88-115] 88 (11/17 0623) Resp:  [14-20] 20 (11/17 0623) BP: (72-132)/(50-76) 92/71 (11/17 0623) SpO2:  [94 %-98 %] 95 % (11/17 0623) Last BM Date: 10/03/16  Intake/Output from previous day: 11/16 0701 - 11/17 0700 In: 1826.7 [P.O.:480; I.V.:1296.7; IV Piggyback:50] Out: 400 [Urine:400] Intake/Output this shift: Total I/O In: 717.5 [I.V.:717.5] Out: 150 [Urine:150]  General appearance: Much more alert and interactive.  No distress.  Mental status normal GI: soft, non-tender; bowel sounds normal; no masses,  no organomegaly Incision/Wound: Right gluteal wound covered by negative pressure dressing.  This has lost its seal and will be changed this morning by [name] according to bedside RN.  No cellulitis  Lab Results:  Results for orders placed or performed during the hospital encounter of 09/30/16 (from the past 24 hour(s))  Glucose, capillary     Status: Abnormal   Collection Time: 10/03/16  8:05 AM  Result Value Ref Range   Glucose-Capillary 142 (H) 65 - 99 mg/dL   Comment 1 Notify RN    Comment 2 Document in Chart   Troponin I (q 6hr x 3)     Status: Abnormal   Collection Time: 10/03/16 11:29 AM  Result Value Ref Range   Troponin I 0.12 (HH) <0.03 ng/mL  Glucose, capillary     Status: Abnormal   Collection Time: 10/03/16 11:43 AM  Result Value Ref Range   Glucose-Capillary 228 (H) 65 - 99 mg/dL   Comment 1 Notify RN    Comment 2 Document in Chart   Glucose, capillary     Status: Abnormal   Collection Time: 10/03/16  4:18 PM  Result Value Ref Range   Glucose-Capillary 163 (H) 65 - 99 mg/dL  Troponin I (q 6hr x 3)      Status: Abnormal   Collection Time: 10/03/16  4:31 PM  Result Value Ref Range   Troponin I 0.18 (HH) <0.03 ng/mL  Glucose, capillary     Status: Abnormal   Collection Time: 10/03/16  9:18 PM  Result Value Ref Range   Glucose-Capillary 174 (H) 65 - 99 mg/dL  Troponin I (q 6hr x 3)     Status: Abnormal   Collection Time: 10/03/16 11:08 PM  Result Value Ref Range   Troponin I 0.05 (HH) <0.03 ng/mL     Studies/Results: Dg Ankle 2 Views Right  Result Date: 10/03/2016 CLINICAL DATA:  Osteomyelitis. EXAM: RIGHT ANKLE - 2 VIEW COMPARISON:  None. FINDINGS: There is no evidence of fracture, dislocation, or joint effusion. There is no evidence of arthropathy or other focal bone abnormality. Soft tissue ulceration is seen overlying the lateral malleolus without underlying lytic destruction. IMPRESSION: Soft tissue ulceration seen laterally. No definite evidence of osteomyelitis. Electronically Signed   By: Marijo Conception, M.D.   On: 10/03/2016 11:32    . cefTRIAXone (ROCEPHIN)  IV  1 g Intravenous Q24H  . feeding supplement (GLUCERNA SHAKE)  237 mL Oral TID BM  . heparin subcutaneous  5,000 Units Subcutaneous Q8H  . insulin aspart  0-9 Units Subcutaneous TID WC  . insulin glargine  5 Units Subcutaneous Daily  . metroNIDAZOLE  500 mg Oral  Q8H  . multivitamin with minerals  1 tablet Oral Daily  . pantoprazole  20 mg Oral Daily  . pregabalin  100 mg Oral BID     Assessment/Plan: s/p Procedure(s): DEBRIDEMENT RIGHT  BUTTOCK WOUND  DEBRIDEMENT RIGHT BUTTOCK WOUND 16cm LONG X 11cm WIDE X 3cm DEEP 11/13 [name] - POD 4 - culture: ABUNDANT VIRIDANS STREPTOCOCCUS NO ANAEROBES ISOLATED; CULTURE IN PROGRESS FOR 5 DAYS - WBC trending down 15.2, afebrile  Troponin elevation Sepsis Hypokalemia - resolved HHNK PAD HLD Asthma Chronic pain Tobacco abuse   ID-, rocephin 11/15>>, flagyl 11/15>> FEN - soft diet VTE - heparin  Plan -  Wound care has now stabilized and can be  managed as an outpatient Negative pressure dressing Monday Wednesday Friday is recommended Okay to switch to oral antibiotics today . From surgical standpoint [name] will likely be ready for discharge today, assuming other medical problems are stable.  Once discharged, she should follow-up with [name] in the [place] surgery office in 2 weeks  @PROBHOSP @  LOS: 4 days    [name] M 10/04/2016  . .prob

## 2016-10-04 NOTE — Progress Notes (Signed)
Progress Note    [name]  XLK:[id number] DOB: 22-Nov-1965  DOA: 09/30/2016 PCP: [name], MD    Brief Narrative:   Chief complaint: Follow-up necrotizing fasciitis  [name] is an 50 y.o. female with a PMH of depression, type 2 diabetes that is uncontrolled and with complications, diabetic foot ulcer, asthma, hyperlipidemia and chronic back pain who was admitted 09/30/16 for evaluation of right-sided numbness and tingling as well as a lesion on her right buttock associated with fever/chills. CT of her right buttock demonstrated gas in the soft tissue/muscle and she subsequently was taken emergently to the OR for necrotizing fasciitis.  Assessment/Plan:   Principal Problem:   Necrotizing fasciitis (HCC)/sepsis/wound infection Patient underwent debridement on 09/30/16. She has a wound VAC in place to her right buttock. Wound cultures positive for strep viridans but needs to be treated for polymicrobial etiologies. Surgery has cleared her for discharge. Discussed case with ID on call and will switch her to Keflex and continue oral Flagyl. Case manager to set up home health.  Active Problems:   Hyperglycemia Currently being managed with insulin sensitive SSI and 5 units of Lantus daily.    Pressure injury of skin Continue pressure reduction techniques.    Acute respiratory failure (HCC) Respiratory status stable.    Demand ischemia ([place]) Secondary to sepsis.    Retinal tumor of right eye CT scan negative for CVA but showed possible retinal tumor. We'll discuss with neurologist in the morning regarding appropriate evaluation.   Family Communication/Anticipated D/C date and plan/Code Status   DVT prophylaxis: Lovenox ordered. Code Status: Full Code.  Family Communication: Husband at the bedside. Disposition Plan: Possibly home tomorrow.   Medical Consultants:    General Surgery  Pulmonology   Procedures:    None  Anti-Infectives:   Cefepime  11/13 >>>11/15 Vancomycin 11/13 >11/15 clinda 11/13>>>11/15 Ceftriaxone 11/15>>>11/17 Flagyl 11/15>>>total 7 days anerobic  Keflex 11/17  Subjective:   The patient reports that Her buttocks remained sore and that she has had problems with chronic diarrhea. Review of symptoms is positive for nausea and negative for vomiting.  Objective:    Vitals:   10/03/16 1417 10/03/16 1552 10/04/16 0623 10/04/16 1424  BP: (!) 73/50 132/76 92/71 102/72  Pulse: (!) 107  88 (!) 109  Resp: 20  20   Temp: 98.3 F (36.8 C)  97.7 F (36.5 C) 98.4 F (36.9 C)  TempSrc: Oral  Oral Oral  SpO2: 96%  95% 97%  Weight:      Height:        Intake/Output Summary (Last 24 hours) at 10/04/16 2027 Last data filed at 10/04/16 1500  Gross per 24 hour  Intake          1710.83 ml  Output              400 ml  Net          1310.83 ml   Filed Weights   09/30/16 1209 09/30/16 2016 10/02/16 0400  Weight: 68.5 kg (151 lb) 68.2 kg (150 lb 5.7 oz) 73.7 kg (162 lb 7.7 oz)    Exam: General exam: Appears calm and comfortable.  Respiratory system: Clear to auscultation. Respiratory effort normal. Cardiovascular system: S1 & S2 heard, RRR. No JVD,  rubs, gallops or clicks. No murmurs. Gastrointestinal system: Abdomen is nondistended, soft and nontender. No organomegaly or masses felt. Normal bowel sounds heard. Central nervous system: Alert and oriented. Disconjugate gaze Extremities: No clubbing,  or  cyanosis. No edema. Skin: Large right buttock wound with wound VAC in place. Psychiatry: Judgement and insight appear normal. Mood & affect appropriate.   Data Reviewed:   I have personally reviewed following labs and imaging studies:  Labs: Basic Metabolic Panel:  Recent Labs Lab 09/30/16 1743  10/01/16 0400 10/01/16 1927 10/02/16 0209 10/03/16 0041 10/04/16 0733  NA 130*  < > 139 136 135 139 135  K 3.2*  < > 3.6 2.8* 3.4* 3.5 4.4  CL 94*  < > 108 102 101 100* 94*  CO2 22  < > 23 25 25 29 30     GLUCOSE 547*  < > 268* 159* 154* 132* 223*  BUN 11  < > 7 <5* <5* <5* <5*  CREATININE 0.72  < > 0.45 0.43* 0.39* 0.52 0.45  CALCIUM 8.5*  < > 7.6* 7.8* 7.6* 8.2* 8.5*  MG 1.3*  --   --   --   --  1.1* 1.3*  < > = values in this interval not displayed. GFR Estimated Creatinine Clearance: 80 mL/min (by C-G formula based on SCr of 0.45 mg/dL). Liver Function Tests:  Recent Labs Lab 09/30/16 1400  AST 46*  ALT 14  ALKPHOS 112  BILITOT 0.9  PROT 6.9  ALBUMIN 2.4*    Recent Labs Lab 09/30/16 1426  LIPASE 18   Coagulation profile  Recent Labs Lab 09/30/16 1426  INR 1.46    CBC:  Recent Labs Lab 09/30/16 1226 09/30/16 1954 10/01/16 0400 10/02/16 0209 10/03/16 0041 10/04/16 0733  WBC 27.4*  --  24.1* 17.6* 15.2* 12.4*  NEUTROABS 24.4*  --   --   --   --   --   HGB 15.1* 11.2* 12.5 12.2 12.7 13.8  HCT 43.3 33.0* 36.4 36.5 38.2 41.9  MCV 81.4  --  81.3 82.6 82.3 84.0  PLT 233  --  340 304 331 320   Cardiac Enzymes:  Recent Labs Lab 09/30/16 1743 10/03/16 1129 10/03/16 1631 10/03/16 2308  TROPONINI 0.09* 0.12* 0.18* 0.05*   CBG:  Recent Labs Lab 10/03/16 1618 10/03/16 2118 10/04/16 0754 10/04/16 1221 10/04/16 1709  GLUCAP 163* 174* 212* 245* 194*   Sepsis Labs:  Recent Labs Lab 09/30/16 1351 09/30/16 1556 10/01/16 0400 10/02/16 0209 10/03/16 0041 10/04/16 0733  WBC  --   --  24.1* 17.6* 15.2* 12.4*  LATICACIDVEN 3.00* 2.25* 0.8  --   --   --     Microbiology Recent Results (from the past 240 hour(s))  Blood Culture (routine x 2)     Status: None (Preliminary result)   Collection Time: 09/30/16  1:29 PM  Result Value Ref Range Status   Specimen Description BLOOD LEFT FOREARM  Final   Special Requests IN PEDIATRIC BOTTLE  3CC  Final   Culture NO GROWTH 4 DAYS  Final   Report Status PENDING  Incomplete  Blood Culture (routine x 2)     Status: None (Preliminary result)   Collection Time: 09/30/16  1:39 PM  Result Value Ref Range  Status   Specimen Description BLOOD LEFT FOREARM  Final   Special Requests IN PEDIATRIC BOTTLE  1CC  Final   Culture NO GROWTH 4 DAYS  Final   Report Status PENDING  Incomplete  Urine culture     Status: Abnormal   Collection Time: 09/30/16  3:48 PM  Result Value Ref Range Status   Specimen Description URINE, RANDOM  Final   Special Requests NONE  Final   Culture  MULTIPLE SPECIES PRESENT, SUGGEST RECOLLECTION (A)  Final   Report Status 10/01/2016 FINAL  Final  Aerobic/Anaerobic Culture (surgical/deep wound)     Status: None (Preliminary result)   Collection Time: 09/30/16  6:58 PM  Result Value Ref Range Status   Specimen Description WOUND  Final   Special Requests RIGHT BUTTOCKS  Final   Gram Stain   Final    ABUNDANT WBC PRESENT,BOTH PMN AND MONONUCLEAR ABUNDANT GRAM NEGATIVE RODS ABUNDANT GRAM POSITIVE COCCI IN PAIRS IN CLUSTERS FEW GRAM POSITIVE RODS    Culture   Final    ABUNDANT VIRIDANS STREPTOCOCCUS NO ANAEROBES ISOLATED; CULTURE IN PROGRESS FOR 5 DAYS    Report Status PENDING  Incomplete  MRSA PCR Screening     Status: None   Collection Time: 09/30/16  8:22 PM  Result Value Ref Range Status   MRSA by PCR NEGATIVE NEGATIVE Final    Comment:        The GeneXpert MRSA Assay (FDA approved for NASAL specimens only), is one component of a comprehensive MRSA colonization surveillance program. It is not intended to diagnose MRSA infection nor to guide or monitor treatment for MRSA infections.     Radiology: Dg Ankle 2 Views Right  Result Date: 10/03/2016 CLINICAL DATA:  Osteomyelitis. EXAM: RIGHT ANKLE - 2 VIEW COMPARISON:  None. FINDINGS: There is no evidence of fracture, dislocation, or joint effusion. There is no evidence of arthropathy or other focal bone abnormality. Soft tissue ulceration is seen overlying the lateral malleolus without underlying lytic destruction. IMPRESSION: Soft tissue ulceration seen laterally. No definite evidence of osteomyelitis.  Electronically Signed   By: Marijo Conception, M.D.   On: 10/03/2016 11:32    Medications:   . cefTRIAXone (ROCEPHIN)  IV  1 g Intravenous Q24H  . feeding supplement (GLUCERNA SHAKE)  237 mL Oral TID BM  . heparin subcutaneous  5,000 Units Subcutaneous Q8H  . insulin aspart  0-9 Units Subcutaneous TID WC  . insulin glargine  5 Units Subcutaneous Daily  . magnesium oxide  400 mg Oral BID  . metroNIDAZOLE  500 mg Oral Q8H  . multivitamin with minerals  1 tablet Oral Daily  . pantoprazole  20 mg Oral Daily  . pregabalin  100 mg Oral BID   Continuous Infusions: . 0.9 % NaCl with KCl 20 mEq / L 50 mL/hr at 10/04/16 0056    Medical decision making is of high complexity and this patient is at high risk of deterioration, therefore this is a level 3 visit.     LOS: 4 days   [facility] Pager [phone]. If unable to reach me by pager, please call my cell phone at [phone].  *Please refer to [email or web address], password [email or web address] to get updated schedule on who will round on this patient, as hospitalists switch teams weekly. If 7PM-7AM, please contact night-coverage at [email or web address], password [email or web address] for any overnight needs.  10/04/2016, 8:27 PM

## 2016-10-04 NOTE — Progress Notes (Signed)
Physical Therapy Treatment Patient Details Name: [name] MRN: [id number] DOB: 28-Aug-1966 Today's Date: 10/04/2016    History of Present Illness 50 year old female with past medical history as below, which is significant for asthma, diabetes, peripheral artery disease, and GERD. Presents with [name] weakness in setting of necrotizing fasciaitis of buttocks, s/p I&D with wound vac application.     PT Comments    The pt is making good progress toward all goals.  She was able to ambulate 240 ft with no apparent LOB.  Pt states that she uses and Rollator at home, so this might be beneficial to have at next session. Pt would benefit from further PT to increase her strength and endurance.  Continue with POC.  Follow Up Recommendations  SNF;Supervision/Assistance - 24 hour     Equipment Recommendations  None recommended by PT    Recommendations for Other Services       Precautions / Restrictions Precautions Precautions: Fall Precaution Comments: wound Vac Restrictions Weight Bearing Restrictions: No    Mobility  Bed Mobility Overal bed mobility: Needs Assistance Bed Mobility: Supine to Sit     Supine to sit: Supervision     General bed mobility comments: Verbal cues needed for correct sequence.  Transfers Overall transfer level: Needs assistance Equipment used: Rolling walker (2 wheeled) Transfers: Sit to/from Stand Sit to Stand: Supervision         General transfer comment: Verbal cues for correct hand placement.  Ambulation/Gait Ambulation/Gait assistance: Supervision Ambulation Distance (Feet): 240 Feet Assistive device: Rolling walker (2 wheeled) Gait Pattern/deviations: Step-through pattern;Decreased stance time - right;Decreased stride length   Gait velocity interpretation: Below normal speed for age/gender General Gait Details: Pt reports using a rollator at home and would prefer that kind.  She also reports chronic hyperextension of R knee during  gait.   Stairs            Wheelchair Mobility    Modified Rankin (Stroke Patients Only)       Balance     Sitting balance-Leahy Scale: Fair       Standing balance-Leahy Scale: Fair                      Cognition Arousal/Alertness: Awake/alert Behavior During Therapy: WFL for tasks assessed/performed Overall Cognitive Status: Within Functional Limits for tasks assessed                      Exercises      General Comments        Pertinent Vitals/Pain Pain Assessment: Faces Faces Pain Scale: Hurts a little bit Pain Location: back and buttocks Pain Descriptors / Indicators: Sore Pain Intervention(s): Monitored during session    Home Living                      Prior Function            PT Goals (current goals can now be found in the care plan section) Acute Rehab PT Goals Patient Stated Goal: to get better PT Goal Formulation: With patient Time For Goal Achievement: 10/16/16 Potential to Achieve Goals: Good Progress towards PT goals: Progressing toward goals    Frequency    Min 2X/week      PT Plan      Co-evaluation             End of Session Equipment Utilized During Treatment: Gait belt Activity Tolerance: Patient tolerated treatment well Patient left:  in bed;with call bell/phone within reach;with bed alarm set;with family/visitor present     Time: 1204-1222 PT Time Calculation (min) (ACUTE ONLY): 18 min  Charges:  $Gait Training: 8-22 mins                    G Codes:      Bary Castilla 10-27-2016, 2:31 PM  Rito Ehrlich. [place], [place]

## 2016-10-04 NOTE — Consult Note (Signed)
[facility] Nurse wound follow up Wound type: surgical wound post debridement Pressure Ulcer POA: No Measurement: 16cmx11cmx3cm Wound bed: pale pink Drainage moderate amount of  sanguineous, no odor   Periwound: intact Dressing procedure/placement/frequency: 3 piece of black foam applied over wound bed and bridged with a 2nd piece of black foam to the right hip (4 pieces total). Protected periwound skin with drape under bridge. 1 barrier rings used along distal edge of wound. Seal at 182mmhg. Patient received second dose of po pain medication during procedure.   Change M/W/F  Deneise Lever FNP-C, [facility] student  Discussed POC with patient and bedside nurse. [facility] [place] nurse will maintain NPWT dressing M/W/F due to complexity.   Thanks,  [employer] MSN, RN,CWOCN, CNS [phone])

## 2016-10-04 NOTE — Progress Notes (Signed)
Pt having chronic diarrhea states she "has this at home" asking for medication  MD notified orders placed

## 2016-10-05 LAB — CBC WITH DIFFERENTIAL/PLATELET
BASOS ABS: 0 10*3/uL (ref 0.0–0.1)
Basophils Relative: 0 %
EOS PCT: 2 %
Eosinophils Absolute: 0.3 10*3/uL (ref 0.0–0.7)
HCT: 40.7 % (ref 36.0–46.0)
Hemoglobin: 13 g/dL (ref 12.0–15.0)
LYMPHS PCT: 16 %
Lymphs Abs: 2.3 10*3/uL (ref 0.7–4.0)
MCH: 27 pg (ref 26.0–34.0)
MCHC: 31.9 g/dL (ref 30.0–36.0)
MCV: 84.6 fL (ref 78.0–100.0)
MONO ABS: 0.7 10*3/uL (ref 0.1–1.0)
Monocytes Relative: 5 %
Neutro Abs: 11.1 10*3/uL — ABNORMAL HIGH (ref 1.7–7.7)
Neutrophils Relative %: 77 %
PLATELETS: 384 10*3/uL (ref 150–400)
RBC: 4.81 MIL/uL (ref 3.87–5.11)
RDW: 13.7 % (ref 11.5–15.5)
WBC: 14.3 10*3/uL — ABNORMAL HIGH (ref 4.0–10.5)

## 2016-10-05 LAB — CULTURE, BLOOD (ROUTINE X 2)
CULTURE: NO GROWTH
CULTURE: NO GROWTH

## 2016-10-05 LAB — BASIC METABOLIC PANEL
ANION GAP: 6 (ref 5–15)
BUN: <5 mg/dL — ABNORMAL LOW (ref 6–20)
CALCIUM: 8 mg/dL — AB (ref 8.9–10.3)
CO2: 27 mmol/L (ref 22–32)
Chloride: 102 mmol/L (ref 101–111)
Creatinine, Ser: 0.38 mg/dL — ABNORMAL LOW (ref 0.44–1.00)
GLUCOSE: 204 mg/dL — AB (ref 65–99)
Potassium: 4.2 mmol/L (ref 3.5–5.1)
SODIUM: 135 mmol/L (ref 135–145)

## 2016-10-05 LAB — GLUCOSE, CAPILLARY
GLUCOSE-CAPILLARY: 194 mg/dL — AB (ref 65–99)
Glucose-Capillary: 237 mg/dL — ABNORMAL HIGH (ref 65–99)
Glucose-Capillary: 244 mg/dL — ABNORMAL HIGH (ref 65–99)
Glucose-Capillary: 226 mg/dL — ABNORMAL HIGH (ref 65–99)

## 2016-10-05 LAB — MAGNESIUM: MAGNESIUM: 1.5 mg/dL — AB (ref 1.7–2.4)

## 2016-10-05 LAB — LACTIC ACID, PLASMA: LACTIC ACID, VENOUS: 0.8 mmol/L (ref 0.5–1.9)

## 2016-10-05 MED ORDER — IBUPROFEN 400 MG PO TABS
800.0000 mg | ORAL_TABLET | Freq: Four times a day (QID) | ORAL | Status: DC | PRN
  Administered 2016-10-05 – 2016-10-06 (×2): 800 mg via ORAL
  Filled 2016-10-05 (×2): qty 2

## 2016-10-05 MED ORDER — SODIUM CHLORIDE 0.9 % IV SOLN
INTRAVENOUS | Status: DC
  Administered 2016-10-05 – 2016-10-06 (×4): via INTRAVENOUS

## 2016-10-05 MED ORDER — SODIUM CHLORIDE 0.9 % IV BOLUS (SEPSIS): 1000.0000 mL | Freq: Once | INTRAVENOUS | Status: DC

## 2016-10-05 NOTE — Progress Notes (Signed)
Provider notified that manual blood pressure of 52/40 and pain rating of 10/10. Motrin ordered and will continue to monitor. Betha Loa Khizar Fiorella, RN

## 2016-10-05 NOTE — Progress Notes (Signed)
Patient ID: [name], female   DOB: 1966-08-09, 50 y.o.   MRN: [id number] Vac functional can dc home from our standpoint f/u with Dr Grandville Silos, will see Monday if still here for dressing change

## 2016-10-05 NOTE — Progress Notes (Signed)
PROGRESS NOTE    [name]  UUV:[id number] DOB: 01-Aug-1966 DOA: 09/30/2016 PCP: [name], MD     Brief Narrative:  [name] is an 50 y.o. female with a PMH of depression, type 2 diabetes that is uncontrolled and with complications, diabetic foot ulcer, asthma, hyperlipidemia and chronic back pain who was admitted 09/30/16 for evaluation of right-sided numbness and tingling as well as a lesion on her right buttock associated with fever/chills. CT of her right buttock demonstrated gas in the soft tissue/muscle and she subsequently was taken emergently to the OR for necrotizing fasciitis.   Assessment & Plan:   Principal Problem:   Necrotizing fasciitis ([place]) Active Problems:   Wound infection   Hyperglycemia   Pressure injury of skin   Acute respiratory failure ([place])   Demand ischemia ([place])   Other hyperlipidemia   Right sided weakness   Retinal tumor of right eye   Necrotizing fasciitis ([facility])/sepsis/wound infection Patient underwent debridement on 09/30/16. She has a wound VAC in place to her right buttock. Wound cultures positive for strep viridans but needs to be treated for polymicrobial etiologies. Surgery has cleared her for discharge. Follow up with Dr. Georganna Skeans at [facility] in 2 weeks. Dr. Rockne Menghini discussed case with ID on call and switched her to Keflex and continue oral Flagyl for total 7 day course. Case manager to set up home health.   Hypotension Continue IVF. Lactic acid negative. Asymptomatic. Monitor. Will check a.m. cortisol   [name] lateral malleolus laceration with eschar Xray negative for osteomyelitis. Continue wound care.   Hyperglycemia Currently being managed with insulin sensitive SSI and 5 units of Lantus daily.  Pressure injury of skin Continue pressure reduction techniques.  Acute respiratory failure  Respiratory status stable.  Demand ischemia  Secondary to sepsis. Troponin trended down.   Retinal tumor? of  right eye CT scan negative for CVA but showed possible retinal tumor, retinal tear, or other vitreous hemorrhage. 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 Discussed with patient today that she needs to follow-up with ophthalmology as previously scheduled to evaluate further imaging needs.   DVT prophylaxis: subq hep  Code Status: full Family Communication: no family at bedside Disposition Plan: refusing SNF. [facility] with [facility]    Consultants:   General surgery  Pulmonology   Procedures:   OR for debridement 11/13   Antimicrobials:   Cefepime 11/13 >>>11/15  Vancomycin 11/13 >11/15  Clinda 11/13>>>11/15  Ceftriaxone 11/15>>>11/17  Flagyl 11/15>>>total 7 days  Keflex 11/17>>>total 7 days   Subjective: Patient doing well today. Has no symptoms besides pain. Denies any dizziness, lightheadedness, chest pain, shortness of breath, nausea or vomiting. Tolerating meals and having bowel movements. Her main complaint continues to be pain in the operative site. Unfortunately, her blood pressure has been low last night into this morning. She remains asymptomatic from her hypotension. States that she always has low blood pressures chronically.  Objective: Vitals:   10/05/16 0002 10/05/16 0454 10/05/16 0520 10/05/16 0707  BP: (!) 87/64 (!) 65/50 (!) 80/60 91/65  Pulse: 79 85  86  Resp:  19    Temp:  98.4 F (36.9 C)    TempSrc:      SpO2:  93%    Weight:      Height:        Intake/Output Summary (Last 24 hours) at 10/05/16 4742 Last data  filed at 10/05/16 0600  Gross per 24 hour  Intake          3698.66 ml  Output              850 ml  Net          2848.66 ml   Filed Weights   09/30/16 1209 09/30/16 2016 10/02/16 0400  Weight: 68.5 kg (151 lb) 68.2 kg (150 lb 5.7 oz) 73.7 kg (162 lb 7.7 oz)    Examination:  General exam: Appears calm and comfortable  Respiratory  system: Clear to auscultation. Respiratory effort normal. Cardiovascular system: S1 & S2 heard, RRR. No JVD, murmurs, rubs, gallops or clicks. No pedal edema. Gastrointestinal system: Abdomen is nondistended, soft and nontender. No organomegaly or masses felt. Normal bowel sounds heard. Central nervous system: Alert and oriented. No focal neurological deficits. Extremities: Symmetric 5 x 5 power. Skin: No rashes, lesions or ulcers. +right side wound vac in place Psychiatry: Judgement and insight appear normal. Mood & affect appropriate.   Data Reviewed: I have personally reviewed following labs and imaging studies  CBC:  Recent Labs Lab 09/30/16 1226 09/30/16 1954 10/01/16 0400 10/02/16 0209 10/03/16 0041 10/04/16 0733  WBC 27.4*  --  24.1* 17.6* 15.2* 12.4*  NEUTROABS 24.4*  --   --   --   --   --   HGB 15.1* 11.2* 12.5 12.2 12.7 13.8  HCT 43.3 33.0* 36.4 36.5 38.2 41.9  MCV 81.4  --  81.3 82.6 82.3 84.0  PLT 233  --  340 304 331 431   Basic Metabolic Panel:  Recent Labs Lab 09/30/16 1743  10/01/16 0400 10/01/16 1927 10/02/16 0209 10/03/16 0041 10/04/16 0733  NA 130*  < > 139 136 135 139 135  K 3.2*  < > 3.6 2.8* 3.4* 3.5 4.4  CL 94*  < > 108 102 101 100* 94*  CO2 22  < > 23 25 25 29 30   GLUCOSE 547*  < > 268* 159* 154* 132* 223*  BUN 11  < > 7 <5* <5* <5* <5*  CREATININE 0.72  < > 0.45 0.43* 0.39* 0.52 0.45  CALCIUM 8.5*  < > 7.6* 7.8* 7.6* 8.2* 8.5*  MG 1.3*  --   --   --   --  1.1* 1.3*  < > = values in this interval not displayed. GFR: Estimated Creatinine Clearance: 80 mL/min (by C-G formula based on SCr of 0.45 mg/dL). Liver Function Tests:  Recent Labs Lab 09/30/16 1400  AST 46*  ALT 14  ALKPHOS 112  BILITOT 0.9  PROT 6.9  ALBUMIN 2.4*    Recent Labs Lab 09/30/16 1426  LIPASE 18   No results for input(s): AMMONIA in the last 168 hours. Coagulation Profile:  Recent Labs Lab 09/30/16 1426  INR 1.46   Cardiac Enzymes:  Recent Labs Lab  09/30/16 1743 10/03/16 1129 10/03/16 1631 10/03/16 2308  TROPONINI 0.09* 0.12* 0.18* 0.05*   BNP (last 3 results) No results for input(s): PROBNP in the last 8760 hours. HbA1C: No results for input(s): HGBA1C in the last 72 hours. CBG:  Recent Labs Lab 10/03/16 2118 10/04/16 0754 10/04/16 1221 10/04/16 1709 10/04/16 2304  GLUCAP 174* 212* 245* 194* 212*   Lipid Profile: No results for input(s): CHOL, HDL, LDLCALC, TRIG, CHOLHDL, LDLDIRECT in the last 72 hours. Thyroid Function Tests: No results for input(s): TSH, T4TOTAL, FREET4, T3FREE, THYROIDAB in the last 72 hours. Anemia Panel: No results for input(s): VITAMINB12, FOLATE, FERRITIN,  TIBC, IRON, RETICCTPCT in the last 72 hours. Sepsis Labs:  Recent Labs Lab 09/30/16 1351 09/30/16 1556 10/01/16 0400  LATICACIDVEN 3.00* 2.25* 0.8    Recent Results (from the past 240 hour(s))  Blood Culture (routine x 2)     Status: None (Preliminary result)   Collection Time: 09/30/16  1:29 PM  Result Value Ref Range Status   Specimen Description BLOOD LEFT FOREARM  Final   Special Requests IN PEDIATRIC BOTTLE  3CC  Final   Culture NO GROWTH 4 DAYS  Final   Report Status PENDING  Incomplete  Blood Culture (routine x 2)     Status: None (Preliminary result)   Collection Time: 09/30/16  1:39 PM  Result Value Ref Range Status   Specimen Description BLOOD LEFT FOREARM  Final   Special Requests IN PEDIATRIC BOTTLE  1CC  Final   Culture NO GROWTH 4 DAYS  Final   Report Status PENDING  Incomplete  Urine culture     Status: Abnormal   Collection Time: 09/30/16  3:48 PM  Result Value Ref Range Status   Specimen Description URINE, RANDOM  Final   Special Requests NONE  Final   Culture MULTIPLE SPECIES PRESENT, SUGGEST RECOLLECTION (A)  Final   Report Status 10/01/2016 FINAL  Final  Aerobic/Anaerobic Culture (surgical/deep wound)     Status: None (Preliminary result)   Collection Time: 09/30/16  6:58 PM  Result Value Ref Range  Status   Specimen Description WOUND  Final   Special Requests RIGHT BUTTOCKS  Final   Gram Stain   Final    ABUNDANT WBC PRESENT,BOTH PMN AND MONONUCLEAR ABUNDANT GRAM NEGATIVE RODS ABUNDANT GRAM POSITIVE COCCI IN PAIRS IN CLUSTERS FEW GRAM POSITIVE RODS    Culture   Final    ABUNDANT VIRIDANS STREPTOCOCCUS NO ANAEROBES ISOLATED; CULTURE IN PROGRESS FOR 5 DAYS    Report Status PENDING  Incomplete  MRSA PCR Screening     Status: None   Collection Time: 09/30/16  8:22 PM  Result Value Ref Range Status   MRSA by PCR NEGATIVE NEGATIVE Final    Comment:        The GeneXpert MRSA Assay (FDA approved for NASAL specimens only), is one component of a comprehensive MRSA colonization surveillance program. It is not intended to diagnose MRSA infection nor to guide or monitor treatment for MRSA infections.        Radiology Studies: Dg Ankle 2 Views Right  Result Date: 10/03/2016 CLINICAL DATA:  Osteomyelitis. EXAM: RIGHT ANKLE - 2 VIEW COMPARISON:  None. FINDINGS: There is no evidence of fracture, dislocation, or joint effusion. There is no evidence of arthropathy or other focal bone abnormality. Soft tissue ulceration is seen overlying the lateral malleolus without underlying lytic destruction. IMPRESSION: Soft tissue ulceration seen laterally. No definite evidence of osteomyelitis. Electronically Signed   By: Marijo Conception, M.D.   On: 10/03/2016 11:32      Scheduled Meds: . cephALEXin  500 mg Oral Q6H  . feeding supplement (GLUCERNA SHAKE)  237 mL Oral TID BM  . heparin subcutaneous  5,000 Units Subcutaneous Q8H  . insulin aspart  0-9 Units Subcutaneous TID WC  . insulin glargine  5 Units Subcutaneous Daily  . magnesium oxide  400 mg Oral BID  . metroNIDAZOLE  500 mg Oral Q8H  . multivitamin with minerals  1 tablet Oral Daily  . pantoprazole  20 mg Oral Daily  . pregabalin  100 mg Oral BID  . sodium chloride  1,000  mL Intravenous Once   Continuous Infusions: . sodium  chloride 125 mL/hr at 10/05/16 0707     LOS: 5 days    Time spent: 40 minutes   Dessa Phi, DO [name] [email or web address] Password [email or web address] 10/05/2016, 7:21 AM

## 2016-10-06 LAB — BASIC METABOLIC PANEL
ANION GAP: 8 (ref 5–15)
BUN: 6 mg/dL (ref 6–20)
CHLORIDE: 103 mmol/L (ref 101–111)
CO2: 25 mmol/L (ref 22–32)
Calcium: 8.1 mg/dL — ABNORMAL LOW (ref 8.9–10.3)
Creatinine, Ser: 0.54 mg/dL (ref 0.44–1.00)
GFR calc Af Amer: >60 mL/min (ref >60)
GFR calc non Af Amer: >60 mL/min (ref >60)
Glucose, Bld: 298 mg/dL — ABNORMAL HIGH (ref 65–99)
POTASSIUM: 3.8 mmol/L (ref 3.5–5.1)
SODIUM: 136 mmol/L (ref 135–145)

## 2016-10-06 LAB — CBC WITH DIFFERENTIAL/PLATELET
Basophils Absolute: 0.1 10*3/uL (ref 0.0–0.1)
Basophils Relative: 1 %
EOS ABS: 0.3 10*3/uL (ref 0.0–0.7)
Eosinophils Relative: 2 %
HCT: 41.5 % (ref 36.0–46.0)
HEMOGLOBIN: 13.4 g/dL (ref 12.0–15.0)
LYMPHS ABS: 2.7 10*3/uL (ref 0.7–4.0)
LYMPHS PCT: 22 %
MCH: 27.2 pg (ref 26.0–34.0)
MCHC: 32.3 g/dL (ref 30.0–36.0)
MCV: 84.3 fL (ref 78.0–100.0)
Monocytes Absolute: 0.8 10*3/uL (ref 0.1–1.0)
Monocytes Relative: 7 %
NEUTROS PCT: 68 %
Neutro Abs: 8.2 10*3/uL — ABNORMAL HIGH (ref 1.7–7.7)
Platelets: 386 10*3/uL (ref 150–400)
RBC: 4.92 MIL/uL (ref 3.87–5.11)
RDW: 13.8 % (ref 11.5–15.5)
WBC: 12 10*3/uL — AB (ref 4.0–10.5)

## 2016-10-06 LAB — CORTISOL-AM, BLOOD: Cortisol - AM: 7.5 ug/dL (ref 6.7–22.6)

## 2016-10-06 LAB — GLUCOSE, CAPILLARY: Glucose-Capillary: 299 mg/dL — ABNORMAL HIGH (ref 65–99)

## 2016-10-06 LAB — MAGNESIUM: MAGNESIUM: 1.5 mg/dL — AB (ref 1.7–2.4)

## 2016-10-06 MED ORDER — SODIUM CHLORIDE 0.9 % IV BOLUS (SEPSIS)
500.0000 mL | Freq: Once | INTRAVENOUS | Status: AC
  Administered 2016-10-06: 500 mL via INTRAVENOUS

## 2016-10-06 MED ORDER — METRONIDAZOLE 500 MG PO TABS: 500.0000 mg | ORAL_TABLET | Freq: Three times a day (TID) | ORAL | 0 refills | Status: AC

## 2016-10-06 MED ORDER — CEPHALEXIN 500 MG PO CAPS: 500.0000 mg | ORAL_CAPSULE | Freq: Four times a day (QID) | ORAL | 0 refills | Status: AC

## 2016-10-06 NOTE — Progress Notes (Signed)
CM  met with pt to confirm delivery of wound VAC; pt has in room and RN states she will switch out vac today at discharge. CM notified [facility] rep, Jermaine of discharge to begin [facility] in the am.  No other CM needs were communicated.

## 2016-10-06 NOTE — Progress Notes (Signed)
Pt's B/P=65/50;HR=83;Lynch NP was called again & ordered  1L NS bolus again .

## 2016-10-06 NOTE — Progress Notes (Signed)
Pt'sB/P=76/45;hr=81;MD on call made aware ordered to give 500cc NS bolus.

## 2016-10-06 NOTE — Progress Notes (Signed)
Rechecked B/P76/59;HR=79;Lynch NP was called again & made aware & ordered to give another 1 L NS bolus.

## 2016-10-06 NOTE — Progress Notes (Signed)
Automatic BP 61/47, Manual BP (verified by 2 RNs) 60/48. Notified on-call physician Dr. Tamala Julian to make him aware. Will await new orders and continue to monitor pt.

## 2016-10-06 NOTE — Progress Notes (Signed)
BP rechecked=80/60.

## 2016-10-06 NOTE — Discharge Summary (Addendum)
Physician Discharge Summary  [name]:[id number] DOB: 06/21/1966 DOA: 09/30/2016  PCP: [name], MD  Admit date: 09/30/2016 Discharge date: 10/06/2016  Admitted From: Home Disposition:  Home. SNF recommended but patient refused.   Recommendations for Outpatient Follow-up:  1. Follow up with PCP in 1 week 2. Negative pressure dressing MWF  3. Finish antibiotics as prescribed 4. Follow up with Dr. Georganna Skeans [facility]) on 11/29  5. Follow up with your ophthalmologist. CT scan showed possible retinal tumor, retinal tear, or other vitreous hemorrhage. Follow up for further exam and imaging needs.  6. Please follow up on the following pending results: final surgical wound culture (negative thus far)  Home Health: RN   Equipment/Devices: wound vac    Discharge Condition: Stable CODE STATUS: Full  Diet recommendation: Diabetic diet   Brief/Interim Summary: [name] Milesis an 50 y.o.femalewith a PMH of depression, type 2 diabetes that is uncontrolled and with complications, diabetic foot ulcer, asthma, hyperlipidemia and chronic back pain who was admitted 09/30/16 for evaluation of right-sided numbness and tingling as well as a lesion on her right buttock associated with fever/chills. CT of her right buttock demonstrated gas in the soft tissue/muscle and she subsequently was taken emergently to the OR for necrotizing fasciitis on 11/13 with wound vac placed. Patient was treated with cefepime, vancomycin, clindamycin, which was eventually switched to Keflex, Flagyl. She should continue this for total 7 days (last day 11/21).   Subjective on day of discharge: Doing well today. No complaints. She denies any dizziness, lightheadedness, chest pain, shortness of breath, nausea or vomiting. States that she still has pain and loose stools. Her loose stools are chronic in nature and she has had this prior to admission. She states that she usually takes Imodium at home  with improvement. Denies any abdominal pain or watery stools.  Discharge Diagnoses:  Principal Problem:   Necrotizing fasciitis ([place]) Active Problems:   Wound infection   Hyperglycemia   Pressure injury of skin   Acute respiratory failure ([place])   Demand ischemia ([place])   Other hyperlipidemia   Right sided weakness   Retinal tumor of right eye   Necrotizing fasciitis (HCC)/sepsis/wound infection Patient underwent debridement on 09/30/16. She has a wound VAC in place to her right buttock. Wound cultures positive for strep viridans but needs to be treated for polymicrobial etiologies. Surgery has cleared her for discharge. Follow up with Dr. Georganna Skeans at [facility] in 2 weeks. Dr. Rockne Menghini discussed case with ID on call and switched her to Keflex and continue oral Flagyl for total 7 day course. Case manager to set up home health for wound vac and wound care.   Hypotension Continue IVF. Lactic acid negative. Asymptomatic. Monitor. Resolved.   [name] lateral malleolus laceration with eschar Xray negative for osteomyelitis. Continue wound care.   Hyperglycemia in DM type 2  Currently being managed with insulin sensitive SSI and 5 units of Lantus daily. Resume home insulin regimen on discharge and follow up with PCP.   Pressure injury of skin Continue pressure reduction techniques.  Demand ischemia  Secondary to sepsis. Troponin trended down.   Retinal tumor? of right eye CT scan negative for CVA but showed possible retinal tumor, retinal tear, or other vitreous hemorrhage. 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 Discussed with patient today that she needs to follow-up with ophthalmology as previously  scheduled to evaluate further imaging needs.   Discharge Instructions  Discharge Instructions    Call MD for:  redness, tenderness, or signs of infection (pain,  swelling, redness, odor or green/yellow discharge around incision site)    Complete by:  As directed    Call MD for:  temperature >100.4    Complete by:  As directed    Diet Carb Modified    Complete by:  As directed    Increase activity slowly    Complete by:  As directed        Medication List    TAKE these medications   albuterol 108 (90 Base) MCG/ACT inhaler Commonly known as:  PROVENTIL HFA;VENTOLIN HFA Inhale 2 puffs into the lungs as needed for wheezing or shortness of breath. Reported on 02/28/2016   albuterol 2 MG tablet Commonly known as:  PROVENTIL Take 2 mg by mouth 3 (three) times daily as needed for shortness of breath.   aspirin 81 MG tablet Take 81 mg by mouth daily as needed for pain.   cephALEXin 500 MG capsule Commonly known as:  KEFLEX Take 1 capsule (500 mg total) by mouth every 6 (six) hours.   cetirizine 10 MG tablet Commonly known as:  ZYRTEC Take 10 mg by mouth daily as needed for allergies. Reported on 03/27/2016   Cholecalciferol 1000 units tablet Take 1,000 Units by mouth daily.   cyclobenzaprine 10 MG tablet Commonly known as:  FLEXERIL Limit 1 tablet by mouth 1  to  3 times per day if tolerated What changed:  how much to take  how to take this  when to take this  reasons to take this  additional instructions   dicyclomine 10 MG capsule Commonly known as:  BENTYL Take 10 mg by mouth 4 (four) times daily as needed for spasms.   Fenofibric Acid 45 MG Cpdr Take 45 mg by mouth daily. Reported on 03/18/2016   furosemide 40 MG tablet Commonly known as:  LASIX Take 40 mg by mouth 2 (two) times daily as needed for fluid.   HUMALOG KWIKPEN 100 UNIT/ML KiwkPen Generic drug:  insulin lispro Inject 10 Units into the skin 3 (three) times daily. Reported on 04/08/2016   HYDROcodone-acetaminophen 10-325 MG tablet Commonly known as:  NORCO Limit 1 tablet by mouth per day or twice per day if tolerated   LANTUS SOLOSTAR 100 UNIT/ML Solostar  Pen Generic drug:  Insulin Glargine Inject 60 Units into the skin at bedtime.   loperamide 2 MG tablet Commonly known as:  IMODIUM A-D Take 2 mg by mouth as needed for diarrhea or loose stools.   meclizine 25 MG tablet Commonly known as:  ANTIVERT Take 25 mg by mouth 3 (three) times daily as needed for dizziness.   metroNIDAZOLE 500 MG tablet Commonly known as:  FLAGYL Take 1 tablet (500 mg total) by mouth every 8 (eight) hours.   naproxen sodium 220 MG tablet Commonly known as:  ANAPROX Take 220 mg by mouth 2 (two) times daily as needed (pain). Reported on 03/18/2016   omeprazole 20 MG capsule Commonly known as:  PRILOSEC Take 20 mg by mouth daily as needed (heartburn).   pregabalin 100 MG capsule Commonly known as:  LYRICA Limit 1 tab by mouth twice a day to 3 times a day if tolerated What changed:  how much to take  how to take this  when to take this  additional instructions  Another medication with the same name was removed. Continue taking this  medication, and follow the directions you see here.   pseudoephedrine-guaifenesin 60-600 MG 12 hr tablet Commonly known as:  MUCINEX D Take 1 tablet by mouth every 12 (twelve) hours as needed for congestion. Reported on 04/08/2016            Durable Medical Equipment        Start     Ordered   10/02/16 1214  For home use only DME Negative pressure wound device  Once    Question Answer Comment  Frequency of dressing change 3 times per week   Length of need 3 Months   Dressing type Foam   Amount of suction 125 mm/Hg   Pressure application Continuous pressure   Supplies 10 canisters and 15 dressings per month for duration of therapy      10/02/16 1214     Follow-up Information    [employer] Follow up.   Why:  for home health RN PT and aide. they will contact you 1-2 days after DC and help with VAC dressing changes Mondays Wednesdays and Fridays.  Contact information: [place] [address] [phone]        [facility] Follow up.   Why:  Will be providing your wound VAC. It will be dleivered to your hospital room 10-04-16 and connected prior to your discharge       THOMPSON,BURKE E, MD Follow up on 10/16/2016.   Specialty:  General Surgery Why:  Your appointment is at 9:10 AM, be at the office 30 minutes early for check in.  Bring photo ID and insurance information.   Contact information: [address] [place] [address] [phone]        Sydnor,LINDA M, MD. Schedule an appointment as soon as possible for a visit in 1 week(s).   Specialty:  Family Medicine Contact information: [place] [address] [phone]          Allergies  Allergen Reactions  . Penicillins Other (See Comments)    Severe Headache    Consultations:  PCCM  General surgery    Procedures/Studies: Dg Chest 2 View  Result Date: 09/30/2016 CLINICAL DATA:  RIGHT-sided weakness, chills, subjective fever. RIGHT gluteal infection and severe pain. Fall 2 months ago. History of diabetes. EXAM: CHEST  2 VIEW COMPARISON:  Chest radiograph June 27, 2011 FINDINGS: Cardiomediastinal silhouette is normal. Mild bronchitic changes. No pleural effusions or focal consolidations. Trachea projects midline and there is no pneumothorax. Soft tissue planes and included osseous structures are non-suspicious. Surgical clips in the included right abdomen compatible with cholecystectomy. IMPRESSION: Mild bronchitic changes. Electronically Signed   By: Elon Alas M.D.   On: 09/30/2016 14:20   Dg Ankle 2 Views Right  Result Date: 10/03/2016 CLINICAL DATA:  Osteomyelitis. EXAM: RIGHT ANKLE - 2 VIEW COMPARISON:  None. FINDINGS: There is no evidence of fracture, dislocation, or joint effusion. There is no evidence of arthropathy or other focal bone abnormality. Soft tissue ulceration is seen overlying the lateral malleolus without  underlying lytic destruction. IMPRESSION: Soft tissue ulceration seen laterally. No definite evidence of osteomyelitis. Electronically Signed   By: Marijo Conception, M.D.   On: 10/03/2016 11:32   Ct Head Wo Contrast  Result Date: 09/30/2016 CLINICAL DATA:  50 year old female with right side weakness, right arm numbness, "right eye droop". Initial encounter. EXAM: CT HEAD WITHOUT CONTRAST TECHNIQUE: Contiguous axial images were obtained from the base of the skull through  the vertex without intravenous contrast. COMPARISON:  Face CT without contrast 02/14/2011. Brain MRI 09/14/2007. FINDINGS: Brain: No midline shift, ventriculomegaly, mass effect, evidence of mass lesion, intracranial hemorrhage or evidence of cortically based acute infarction. Gray-white matter differentiation is within normal limits throughout the brain. Vascular: Calcified atherosclerosis at the skull base. No suspicious intracranial vascular hyperdensity. Skull: No osseous abnormality identified. Sinuses/Orbits: Mild bubbly opacity and mucosal thickening in the posterior right ethmoid air cell. Other Visualized paranasal sinuses and mastoids are stable and well pneumatized. Other: Abnormal right globe with posterior hyperdensity with a v-shaped extension toward the lens (series 2, image 5). The right globe was normal on prior studies. The other visible bilateral orbits soft tissues are normal. Visualized scalp soft tissues are within normal limits. IMPRESSION: 1. Abnormal right globe, new since 2012. Top differential considerations include right retinal tumor, retinal tear, or other vitreous hemorrhage. The appearance is not typical of retinal or choroidal detachment, or post treatment changes to the globe. 2.  Normal noncontrast CT appearance of the brain. 3. Mild acute inflammation of the posterior right ethmoid air cell. Electronically Signed   By: Genevie Ann M.D.   On: 09/30/2016 15:27   Ct Abdomen Pelvis W Contrast  Result Date:  09/30/2016 CLINICAL DATA:  50 year old female with right sided extremity weakness, chills, subjective fevers and right gluteus infection with severe pain. EXAM: CT ABDOMEN AND PELVIS WITH CONTRAST TECHNIQUE: Multidetector CT imaging of the abdomen and pelvis was performed using the standard protocol following bolus administration of intravenous contrast. CONTRAST:  152mL ISOVUE-300 IOPAMIDOL (ISOVUE-300) INJECTION 61% COMPARISON:  03/01/2008 CT angiogram of the abdomen and pelvis. FINDINGS: Lower chest: Hypoventilatory changes in the dependent lung bases. Coronary atherosclerosis. Hepatobiliary: Normal liver size. Hypodense subcapsular 1.2 cm liver lesion in the far inferior right liver lobe (series 2/ image 41), new since 03/01/2008. No additional liver lesions. Cholecystectomy. No biliary ductal dilatation. Pancreas: Normal, with no mass or duct dilation. Spleen: Normal size spleen. There are 5 hypodense lesions scattered throughout the spleen, largest 1.4 cm, not easily compared to the 03/01/2008 CT study, which was performed at a different phase of contrast. Adrenals/Urinary Tract: Normal adrenals. There is symmetric fullness of the central renal collecting systems without overt hydronephrosis. No renal mass. Distended bladder without bladder wall thickening. Stomach/Bowel: Grossly normal stomach. Normal caliber small bowel with no small bowel wall thickening. Normal appendix. Normal large bowel with no diverticulosis, large bowel wall thickening or pericolonic fat stranding. Vascular/Lymphatic: Atherosclerotic nonaneurysmal abdominal aorta. Right external iliac artery and right common femoral artery stents are in place. Apparent occlusion of the right common iliac artery. Patent portal, hepatic, splenic and renal veins. No pathologically enlarged lymph nodes in the abdomen or pelvis. Reproductive: Grossly normal uterus.  No adnexal mass. Other: No pneumoperitoneum, ascites or focal fluid collection. There is  extensive soft tissue gas in the subcutaneous and gluteal muscle tissues of the right lower buttock. There is extensive soft tissue gas extending into the right ischiorectal fat and right hip adductor muscles. No focal fluid collections. Musculoskeletal: No aggressive appearing focal osseous lesions. Status post bilateral posterior spinal fusion at L5-S1 with bone cages in the L4-5 and L5-S1 disc spaces. Healed deformity in the medial right iliac bone from bone graft harvest defect. Mild thoracolumbar spondylosis. IMPRESSION: 1. Extensive soft tissue gas in the subcutaneous soft tissues and gluteal muscles of the right lower buttock. Extensive soft tissue gas extends into the right ischiorectal fat and right hip adductor muscles. No focal fluid  collections. Please note that the inferior extent of this process is not demonstrated on this CT study. These findings are worrisome for necrotizing fasciitis and urgent surgical consultation is advised. 2. 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 3. Symmetric fullness of the renal collecting systems without overt hydronephrosis, probably due to bladder distention. Correlate clinically to exclude bladder outlet obstruction. 4. **An incidental finding of potential clinical significance has been found. New subcapsular low-attenuation inferior right liver lobe lesion, indeterminate. Several small low-attenuation splenic lesions, not definitely seen on 2009 CT, indeterminate. MRI of the abdomen without and with IV contrast is recommended on a short term outpatient basis. ** 5. Coronary atherosclerosis. These results were called by telephone at the time of interpretation on 09/30/2016 at 3:45 pm to Dr. Marda Stalker , who verbally acknowledged these results. Electronically Signed   By: Ilona Sorrel M.D.   On: 09/30/2016 15:46   Dg Chest Port 1 View  Result Date: 10/01/2016 CLINICAL DATA:  Acute onset of  shortness of breath and respiratory failure. Initial encounter. EXAM: PORTABLE CHEST 1 VIEW COMPARISON:  Chest radiograph performed 09/30/2016 FINDINGS: The patient's endotracheal tube is seen ending 2-3 cm above the carina. An enteric tube is noted extending below the diaphragm. The lungs are well-aerated. Minimal left basilar atelectasis is noted. There is no evidence of pleural effusion or pneumothorax. The cardiomediastinal silhouette is borderline normal in size. No acute osseous abnormalities are seen. IMPRESSION: 1. Endotracheal tube seen ending 2-3 cm above the carina. 2. Minimal left basilar atelectasis noted. Electronically Signed   By: Garald Balding M.D.   On: 10/01/2016 01:48   Dg Abd Portable 1v  Result Date: 10/01/2016 CLINICAL DATA:  Orogastric tube placement.  Initial encounter. EXAM: PORTABLE ABDOMEN - 1 VIEW COMPARISON:  CT of the abdomen and pelvis from 09/30/2016 FINDINGS: The patient's enteric tube is noted ending overlying the body of the stomach. The visualized bowel gas pattern is grossly unremarkable, with a small amount of stool noted in the colon. Clips are noted within the right upper quadrant, reflecting prior cholecystectomy. No acute osseous abnormalities are identified. Lumbosacral fusion hardware is noted. IMPRESSION: Enteric tube noted ending overlying the body of the stomach. Electronically Signed   By: Garald Balding M.D.   On: 10/01/2016 01:47     Discharge Exam: Vitals:   10/06/16 0803 10/06/16 0851  BP: 110/68 107/65  Pulse: 88 81  Resp:    Temp:     Vitals:   10/06/16 0335 10/06/16 0552 10/06/16 0803 10/06/16 0851  BP: (!) 60/48 (!) 82/57 110/68 107/65  Pulse:  85 88 81  Resp:  18    Temp:  97.9 F (36.6 C)    TempSrc:  Oral    SpO2:  92%  97%  Weight:      Height:        General: Pt is alert, awake, not in acute distress Cardiovascular: RRR, S1/S2 +, no rubs, no gallops Respiratory: CTA bilaterally, no wheezing, no rhonchi Abdominal: Soft,  NT, ND, bowel sounds + Extremities: no edema, no cyanosis Skin: right buttock wound vac in place     The results of significant diagnostics from this hospitalization (including imaging, microbiology, ancillary and laboratory) are listed below for reference.     Microbiology: Recent Results (from the past 240 hour(s))  Blood Culture (routine x 2)     Status: None   Collection Time: 09/30/16  1:29 PM  Result Value Ref Range Status  Specimen Description BLOOD LEFT FOREARM  Final   Special Requests IN PEDIATRIC BOTTLE  3CC  Final   Culture NO GROWTH 5 DAYS  Final   Report Status 10/05/2016 FINAL  Final  Blood Culture (routine x 2)     Status: None   Collection Time: 09/30/16  1:39 PM  Result Value Ref Range Status   Specimen Description BLOOD LEFT FOREARM  Final   Special Requests IN PEDIATRIC BOTTLE  1CC  Final   Culture NO GROWTH 5 DAYS  Final   Report Status 10/05/2016 FINAL  Final  Urine culture     Status: Abnormal   Collection Time: 09/30/16  3:48 PM  Result Value Ref Range Status   Specimen Description URINE, RANDOM  Final   Special Requests NONE  Final   Culture MULTIPLE SPECIES PRESENT, SUGGEST RECOLLECTION (A)  Final   Report Status 10/01/2016 FINAL  Final  Aerobic/Anaerobic Culture (surgical/deep wound)     Status: None (Preliminary result)   Collection Time: 09/30/16  6:58 PM  Result Value Ref Range Status   Specimen Description WOUND  Final   Special Requests RIGHT BUTTOCKS  Final   Gram Stain   Final    ABUNDANT WBC PRESENT,BOTH PMN AND MONONUCLEAR ABUNDANT GRAM NEGATIVE RODS ABUNDANT GRAM POSITIVE COCCI IN PAIRS IN CLUSTERS FEW GRAM POSITIVE RODS    Culture   Final    ABUNDANT VIRIDANS STREPTOCOCCUS NO ANAEROBES ISOLATED CULTURE REINCUBATED FOR BETTER GROWTH    Report Status PENDING  Incomplete  MRSA PCR Screening     Status: None   Collection Time: 09/30/16  8:22 PM  Result Value Ref Range Status   MRSA by PCR NEGATIVE NEGATIVE Final    Comment:         The GeneXpert MRSA Assay (FDA approved for NASAL specimens only), is one component of a comprehensive MRSA colonization surveillance program. It is not intended to diagnose MRSA infection nor to guide or monitor treatment for MRSA infections.      Labs: BNP (last 3 results) No results for input(s): BNP in the last 8760 hours. Basic Metabolic Panel:  Recent Labs Lab 09/30/16 1743  10/02/16 0209 10/03/16 0041 10/04/16 0733 10/05/16 0723 10/06/16 0644  NA 130*  < > 135 139 135 135 136  K 3.2*  < > 3.4* 3.5 4.4 4.2 3.8  CL 94*  < > 101 100* 94* 102 103  CO2 22  < > 25 29 30 27 25   GLUCOSE 547*  < > 154* 132* 223* 204* 298*  BUN 11  < > <5* <5* <5* <5* 6  CREATININE 0.72  < > 0.39* 0.52 0.45 0.38* 0.54  CALCIUM 8.5*  < > 7.6* 8.2* 8.5* 8.0* 8.1*  MG 1.3*  --   --  1.1* 1.3* 1.5* 1.5*  < > = values in this interval not displayed. Liver Function Tests:  Recent Labs Lab 09/30/16 1400  AST 46*  ALT 14  ALKPHOS 112  BILITOT 0.9  PROT 6.9  ALBUMIN 2.4*    Recent Labs Lab 09/30/16 1426  LIPASE 18   No results for input(s): AMMONIA in the last 168 hours. CBC:  Recent Labs Lab 09/30/16 1226  10/02/16 0209 10/03/16 0041 10/04/16 0733 10/05/16 1108 10/06/16 0644  WBC 27.4*  < > 17.6* 15.2* 12.4* 14.3* 12.0*  NEUTROABS 24.4*  --   --   --   --  11.1* 8.2*  HGB 15.1*  < > 12.2 12.7 13.8 13.0 13.4  HCT 43.3  < >  36.5 38.2 41.9 40.7 41.5  MCV 81.4  < > 82.6 82.3 84.0 84.6 84.3  PLT 233  < > 304 331 320 384 386  < > = values in this interval not displayed. Cardiac Enzymes:  Recent Labs Lab 09/30/16 1743 10/03/16 1129 10/03/16 1631 10/03/16 2308  TROPONINI 0.09* 0.12* 0.18* 0.05*   BNP: Invalid input(s): POCBNP CBG:  Recent Labs Lab 10/05/16 0831 10/05/16 1207 10/05/16 1748 10/05/16 2222 10/06/16 0802  GLUCAP 194* 237* 244* 226* 299*   D-Dimer No results for input(s): DDIMER in the last 72 hours. Hgb A1c No results for input(s): HGBA1C in  the last 72 hours. Lipid Profile No results for input(s): CHOL, HDL, LDLCALC, TRIG, CHOLHDL, LDLDIRECT in the last 72 hours. Thyroid function studies No results for input(s): TSH, T4TOTAL, T3FREE, THYROIDAB in the last 72 hours.  Invalid input(s): FREET3 Anemia work up No results for input(s): VITAMINB12, FOLATE, FERRITIN, TIBC, IRON, RETICCTPCT in the last 72 hours. Urinalysis    Component Value Date/Time   COLORURINE YELLOW 09/30/2016 1547   APPEARANCEUR CLOUDY (A) 09/30/2016 1547   LABSPEC 1.027 09/30/2016 1547   PHURINE 5.0 09/30/2016 1547   GLUCOSEU >1000 (A) 09/30/2016 1547   HGBUR MODERATE (A) 09/30/2016 1547   BILIRUBINUR NEGATIVE 09/30/2016 1547   KETONESUR NEGATIVE 09/30/2016 1547   PROTEINUR NEGATIVE 09/30/2016 1547   UROBILINOGEN 0.2 06/25/2011 1707   NITRITE NEGATIVE 09/30/2016 1547   LEUKOCYTESUR NEGATIVE 09/30/2016 1547   Sepsis Labs Invalid input(s): PROCALCITONIN,  WBC,  LACTICIDVEN Microbiology Recent Results (from the past 240 hour(s))  Blood Culture (routine x 2)     Status: None   Collection Time: 09/30/16  1:29 PM  Result Value Ref Range Status   Specimen Description BLOOD LEFT FOREARM  Final   Special Requests IN PEDIATRIC BOTTLE  3CC  Final   Culture NO GROWTH 5 DAYS  Final   Report Status 10/05/2016 FINAL  Final  Blood Culture (routine x 2)     Status: None   Collection Time: 09/30/16  1:39 PM  Result Value Ref Range Status   Specimen Description BLOOD LEFT FOREARM  Final   Special Requests IN PEDIATRIC BOTTLE  1CC  Final   Culture NO GROWTH 5 DAYS  Final   Report Status 10/05/2016 FINAL  Final  Urine culture     Status: Abnormal   Collection Time: 09/30/16  3:48 PM  Result Value Ref Range Status   Specimen Description URINE, RANDOM  Final   Special Requests NONE  Final   Culture MULTIPLE SPECIES PRESENT, SUGGEST RECOLLECTION (A)  Final   Report Status 10/01/2016 FINAL  Final  Aerobic/Anaerobic Culture (surgical/deep wound)     Status: None  (Preliminary result)   Collection Time: 09/30/16  6:58 PM  Result Value Ref Range Status   Specimen Description WOUND  Final   Special Requests RIGHT BUTTOCKS  Final   Gram Stain   Final    ABUNDANT WBC PRESENT,BOTH PMN AND MONONUCLEAR ABUNDANT GRAM NEGATIVE RODS ABUNDANT GRAM POSITIVE COCCI IN PAIRS IN CLUSTERS FEW GRAM POSITIVE RODS    Culture   Final    ABUNDANT VIRIDANS STREPTOCOCCUS NO ANAEROBES ISOLATED CULTURE REINCUBATED FOR BETTER GROWTH    Report Status PENDING  Incomplete  MRSA PCR Screening     Status: None   Collection Time: 09/30/16  8:22 PM  Result Value Ref Range Status   MRSA by PCR NEGATIVE NEGATIVE Final    Comment:        The GeneXpert MRSA  Assay (FDA approved for NASAL specimens only), is one component of a comprehensive MRSA colonization surveillance program. It is not intended to diagnose MRSA infection nor to guide or monitor treatment for MRSA infections.      Time coordinating discharge: Over 30 minutes  SIGNED:  Dessa Phi, DO [name] Pager [phone]  If 7PM-7AM, please contact night-coverage [email or web address] Password [email or web address] 10/06/2016, 9:44 AM

## 2016-10-06 NOTE — Progress Notes (Signed)
Rechecked B/P=87/24;HR=79.[name] was also called bec.pt.is asking for pain & if  It's ok to give pain med & said it's ok.

## 2016-10-06 NOTE — Discharge Instructions (Signed)
Recommendations for Outpatient Follow-up:  1. Follow up with PCP in 1 week 2. Negative pressure dressing MWF  3. Finish antibiotics as prescribed 4. Follow up with Dr. Georganna Skeans [facility]) on 11/29  5. Follow up with your ophthalmologist. CT scan showed possible retinal tumor, retinal tear, or other vitreous hemorrhage. Follow up for further exam and imaging needs.  6. Please follow up on the following pending results: final surgical wound culture (negative thus far)   [place] will help you with this Vacuum-assisted closure (VAC) therapy uses a device that removes fluid and germs from wounds to help them heal. It is used on wounds that cannot be closed with stitches. They often heal slowly. Vacuum-assisted therapy helps the wound stay clean and healthy while the open wound slowly grows back together. Vacuum-assisted closure therapy uses a bandage (dressing) that is made of foam. It is put inside the wound. Then, a drape is placed over the wound. This drape sticks to your skin to keep air out, and to protect the wound. A tube is hooked up to a small pump and is attached to the drape. The pump sucks out the fluid and germs. Vacuum-assisted closure therapy can also help reduce the bad smell that comes from the wound. HOW DOES IT WORK?  The vacuum pump pulls fluid through the foam dressing. The dressing may wrinkle during this process. The fluid goes into the tube and away from the wound. The fluid then goes into a container. The fluid in the container must be replaced if it is full or at least once a week, even if the container is not full. The pulling from the pump helps to close the wound and bring better circulation to the wound area. The foam dressing covers and protects the wound. It helps your wound heal faster.  HOW DOES IT FEEL?   You might feel a little pulling when the pump is on.  You might also feel a mild vibrating  sensation.  You might feel some discomfort when the dressing is taken off. CAN I MOVE AROUND WITH VACUUM-ASSISTED CLOSURE THERAPY? Yes, it has a backup battery which is used when the machine is not plugged in, as long as the battery is working, you can move freely. WHAT ARE SOME THINGS I MUST KNOW?  Do not turn off the pump yourself, unless instructed to do so by your healthcare provider, such as for bathing.  Do not take off the dressing yourself, unless instructed to do so by your caregiver.  You can wash or shower with the dressing. However, do not take the pump into the shower. Make sure the wound dressing is protected and covered with plastic. The wound area must stay dry.  Do not turn off the pump for more than 2 hours. If the pump is off for more than 2 hours, your nurse must change your dressing.  Check frequently that the machine is on, that the machine indicates the therapy is on, and that all clamps are open. THE ALARM IS SOUNDING! WHAT SHOULD I DO?   Stay calm.  Do not turn off the pump or do anything with the dressing.  Call your clinic or caregiver right away if the alarm goes off and you cannot fix the problem. Some reasons the alarm might go off include:  The fluid collection container is full.  The battery is low.  The dressing has a leak.  Explain to your caregiver what is  happening. Follow the instructions you receive. WHEN SHOULD I CALL FOR HELP?   You have severe pain.  You have difficulty breathing.  You have bleeding that will not stop.  Your wound smells bad.  You have redness, swelling, or fluid leaking from your wound.  Your alarm goes off and you do not know what to do.  You have a fever.  Your wound itches severely.  Your dressing changes are often painful or bleeding often occurs.  You have diarrhea.  You have a sore throat.  You have a rash around the dressing or anywhere else on your body.  You feel nauseous.  You feel dizzy  or weak.  The [facility] machine has been off for more than 2 hours. HOW DO I GET READY TO GO HOME WITH A PUMP?  A trained caregiver will talk to you and answer your questions about your vacuum-assisted closure therapy before you go home. He or she will explain what to expect. A caregiver will come to your home to apply the pump and care for your wound. The at-home caregiver will be available for questions and will come back for the scheduled dressing changes, usually every 48-72 hours (or more often for severely infected wounds). Your at-home caregiver will also come if you are having an unexpected problem. If you have questions or do not know what to do when you go home, talk to your healthcare provider. This information is not intended to replace advice given to you by your health care provider. Make sure you discuss any questions you have with your health care provider. Document Released: 10/17/2008 Document Revised: 07/07/2013 Document Reviewed: 08/10/2015 Elsevier Interactive Patient Education  2017 [employer].

## 2016-10-07 LAB — AEROBIC/ANAEROBIC CULTURE W GRAM STAIN (SURGICAL/DEEP WOUND)

## 2016-10-07 LAB — AEROBIC/ANAEROBIC CULTURE (SURGICAL/DEEP WOUND)

## 2016-11-26 ENCOUNTER — Emergency Department (HOSPITAL_COMMUNITY): Payer: Medicare Other

## 2016-11-26 ENCOUNTER — Encounter (HOSPITAL_COMMUNITY): Payer: Self-pay

## 2016-11-26 ENCOUNTER — Observation Stay (HOSPITAL_COMMUNITY)
Admission: EM | Admit: 2016-11-26 | Discharge: 2016-11-28 | Disposition: A | Discharge status: Home / Self Care | Payer: Medicare Other | Attending: Internal Medicine | Admitting: Internal Medicine

## 2016-11-26 DIAGNOSIS — R6521 Severe sepsis with septic shock: Secondary | ICD-10-CM

## 2016-11-26 DIAGNOSIS — Z880 Allergy status to penicillin: Secondary | ICD-10-CM | POA: Diagnosis not present

## 2016-11-26 DIAGNOSIS — L97309 Non-pressure chronic ulcer of unspecified ankle with unspecified severity: Secondary | ICD-10-CM | POA: Insufficient documentation

## 2016-11-26 DIAGNOSIS — I9589 Other hypotension: Secondary | ICD-10-CM

## 2016-11-26 DIAGNOSIS — B9689 Other specified bacterial agents as the cause of diseases classified elsewhere: Secondary | ICD-10-CM | POA: Diagnosis not present

## 2016-11-26 DIAGNOSIS — Z9111 Patient's noncompliance with dietary regimen: Secondary | ICD-10-CM | POA: Diagnosis not present

## 2016-11-26 DIAGNOSIS — M549 Dorsalgia, unspecified: Secondary | ICD-10-CM | POA: Diagnosis not present

## 2016-11-26 DIAGNOSIS — E785 Hyperlipidemia, unspecified: Secondary | ICD-10-CM | POA: Insufficient documentation

## 2016-11-26 DIAGNOSIS — Z794 Long term (current) use of insulin: Secondary | ICD-10-CM | POA: Diagnosis not present

## 2016-11-26 DIAGNOSIS — Z7982 Long term (current) use of aspirin: Secondary | ICD-10-CM | POA: Diagnosis not present

## 2016-11-26 DIAGNOSIS — M542 Cervicalgia: Secondary | ICD-10-CM | POA: Diagnosis not present

## 2016-11-26 DIAGNOSIS — E1151 Type 2 diabetes mellitus with diabetic peripheral angiopathy without gangrene: Secondary | ICD-10-CM | POA: Insufficient documentation

## 2016-11-26 DIAGNOSIS — Z79899 Other long term (current) drug therapy: Secondary | ICD-10-CM | POA: Insufficient documentation

## 2016-11-26 DIAGNOSIS — G8929 Other chronic pain: Secondary | ICD-10-CM | POA: Diagnosis not present

## 2016-11-26 DIAGNOSIS — L089 Local infection of the skin and subcutaneous tissue, unspecified: Secondary | ICD-10-CM | POA: Diagnosis present

## 2016-11-26 DIAGNOSIS — F17210 Nicotine dependence, cigarettes, uncomplicated: Secondary | ICD-10-CM | POA: Insufficient documentation

## 2016-11-26 DIAGNOSIS — L89159 Pressure ulcer of sacral region, unspecified stage: Secondary | ICD-10-CM | POA: Diagnosis present

## 2016-11-26 DIAGNOSIS — E11628 Type 2 diabetes mellitus with other skin complications: Secondary | ICD-10-CM

## 2016-11-26 DIAGNOSIS — J45909 Unspecified asthma, uncomplicated: Secondary | ICD-10-CM | POA: Insufficient documentation

## 2016-11-26 DIAGNOSIS — I951 Orthostatic hypotension: Principal | ICD-10-CM | POA: Diagnosis present

## 2016-11-26 DIAGNOSIS — E119 Type 2 diabetes mellitus without complications: Secondary | ICD-10-CM

## 2016-11-26 DIAGNOSIS — T148XXA Other injury of unspecified body region, initial encounter: Secondary | ICD-10-CM

## 2016-11-26 DIAGNOSIS — A419 Sepsis, unspecified organism: Secondary | ICD-10-CM | POA: Diagnosis present

## 2016-11-26 HISTORY — DX: Type 2 diabetes mellitus with diabetic neuropathy, unspecified: E11.40

## 2016-11-26 HISTORY — DX: Family history of other specified conditions: Z84.89

## 2016-11-26 HISTORY — DX: Gastro-esophageal reflux disease without esophagitis: K21.9

## 2016-11-26 LAB — CBC WITH DIFFERENTIAL/PLATELET
BASOS ABS: 0 10*3/uL (ref 0.0–0.1)
Basophils Relative: 0 %
EOS PCT: 1 %
Eosinophils Absolute: 0.2 10*3/uL (ref 0.0–0.7)
HEMATOCRIT: 35.7 % — AB (ref 36.0–46.0)
HEMOGLOBIN: 11.8 g/dL — AB (ref 12.0–15.0)
LYMPHS ABS: 4.1 10*3/uL — AB (ref 0.7–4.0)
LYMPHS PCT: 23 %
MCH: 27.1 pg (ref 26.0–34.0)
MCHC: 33.1 g/dL (ref 30.0–36.0)
MCV: 82.1 fL (ref 78.0–100.0)
Monocytes Absolute: 1.1 10*3/uL — ABNORMAL HIGH (ref 0.1–1.0)
Monocytes Relative: 6 %
NEUTROS ABS: 12.3 10*3/uL — AB (ref 1.7–7.7)
Neutrophils Relative %: 70 %
Platelets: 360 10*3/uL (ref 150–400)
RBC: 4.35 MIL/uL (ref 3.87–5.11)
RDW: 15 % (ref 11.5–15.5)
WBC: 17.7 10*3/uL — AB (ref 4.0–10.5)

## 2016-11-26 LAB — COMPREHENSIVE METABOLIC PANEL
ALBUMIN: 2.7 g/dL — AB (ref 3.5–5.0)
ALK PHOS: 84 U/L (ref 38–126)
ALT: 7 U/L — AB (ref 14–54)
ANION GAP: 12 (ref 5–15)
AST: 10 U/L — ABNORMAL LOW (ref 15–41)
BUN: 13 mg/dL (ref 6–20)
CALCIUM: 8.9 mg/dL (ref 8.9–10.3)
CHLORIDE: 95 mmol/L — AB (ref 101–111)
CO2: 27 mmol/L (ref 22–32)
Creatinine, Ser: 0.75 mg/dL (ref 0.44–1.00)
GFR calc non Af Amer: >60 mL/min (ref >60)
Glucose, Bld: 220 mg/dL — ABNORMAL HIGH (ref 65–99)
Potassium: 3.1 mmol/L — ABNORMAL LOW (ref 3.5–5.1)
SODIUM: 134 mmol/L — AB (ref 135–145)
Total Bilirubin: 0.2 mg/dL — ABNORMAL LOW (ref 0.3–1.2)
Total Protein: 6.8 g/dL (ref 6.5–8.1)

## 2016-11-26 LAB — I-STAT CG4 LACTIC ACID, ED
Lactic Acid, Venous: 2.25 mmol/L (ref 0.5–1.9)
Lactic Acid, Venous: 1.53 mmol/L (ref 0.5–1.9)

## 2016-11-26 LAB — PROTIME-INR
INR: 1.06
Prothrombin Time: 13.8 seconds (ref 11.4–15.2)

## 2016-11-26 MED ORDER — DEXTROSE 5 % IV SOLN
2.0000 g | Freq: Once | INTRAVENOUS | Status: AC
  Administered 2016-11-26: 2 g via INTRAVENOUS
  Filled 2016-11-26: qty 2

## 2016-11-26 MED ORDER — VANCOMYCIN HCL IN DEXTROSE 1-5 GM/200ML-% IV SOLN
1000.0000 mg | Freq: Once | INTRAVENOUS | Status: AC
  Administered 2016-11-27: 1000 mg via INTRAVENOUS
  Filled 2016-11-26: qty 200

## 2016-11-26 MED ORDER — SODIUM CHLORIDE 0.9 % IV BOLUS (SEPSIS)
30.0000 mL/kg | Freq: Once | INTRAVENOUS | Status: AC
  Administered 2016-11-26: 1947 mL via INTRAVENOUS

## 2016-11-26 MED ORDER — POTASSIUM CHLORIDE CRYS ER 20 MEQ PO TBCR
40.0000 meq | EXTENDED_RELEASE_TABLET | Freq: Once | ORAL | Status: AC
  Administered 2016-11-26: 40 meq via ORAL
  Filled 2016-11-26: qty 2

## 2016-11-26 NOTE — ED Provider Notes (Signed)
[place] DEPT Provider Note   CSN: [id number] Arrival date & time: 11/26/16  2038     History   Chief Complaint Chief Complaint  Patient presents with  . Hypotension  . Dizziness    HPI [name] is a 51 y.o. female.  HPI Patient reports that she's been getting dizzy and lightheaded for a while. She went to see her doctor today and they found her blood pressure to be 69/46. Patient reports that she has had a recent sinus infection and took all antibiotics as prescribed. She does feel that those symptoms had improved. She denies any significant amount of cough. She has not had documented fever. No shortness of breath. Patient did have sepsis in association with a decubitus wound infection in November. She reports she had to have surgery and a prolonged hospitalization. She believes the wound seems to be doing fairly well. She reports her some new skin growth and she doesn't feel like it has gotten worse. Patient poor she does not take any blood pressure medications. She reports she does take hydrocodone twice a day for the pain associated with her wound. Past Medical History:  Diagnosis Date  . Arthritis   . Asthma   . Chronic back pain   . Depression   . Diabetes mellitus   . Hyperlipidemia   . Joint pain   . Leg pain    With Walking  . PAD (peripheral artery disease) ([place])   . Peripheral arterial disease ([place])   . Reflux   . Ulcer [facility])    Foot    Patient Active Problem List   Diagnosis Date Noted  . Sacral decubitus ulcer 11/27/2016  . Sepsis associated hypotension ([place]) 11/27/2016  . DM2 (diabetes mellitus, type 2) ([place]) 11/27/2016  . Chronic back pain 11/27/2016  . Orthostatic hypotension 11/27/2016  . Demand ischemia ([place])   . Other hyperlipidemia   . Right sided weakness   . Retinal tumor of right eye   . Pressure injury of skin 10/01/2016  . Acute respiratory failure ([place])   . Wound infection 09/30/2016  . Necrotizing fasciitis ([place]) 09/30/2016    . Hyperglycemia   . Bilateral subclavian artery occlusion ([place]) 03/05/2016  . Paresthesia of both hands 03/05/2016  . DDD (degenerative disc disease), lumbosacral 03/26/2015  . DDD (degenerative disc disease), cervical 03/26/2015  . Sacroiliac joint disease 03/26/2015  . Occipital neuralgia 03/26/2015  . Hip pain 10/07/2014  . Arm heaviness 04/25/2014  . Weakness of both legs-Arms 04/25/2014  . Pain in limb-Bilateral Leg 04/25/2014  . PAD (peripheral artery disease) ([place]) 04/05/2013  . 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 Peripheral vascular disease, unspecified 02/10/2012  . 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 TRIGGER FINGER 02/27/2009    Past Surgical History:  Procedure Laterality Date  . ABDOMINAL AORTAGRAM  June 15, 2014  . ABDOMINAL AORTAGRAM N/A 06/15/2014   Procedure: ABDOMINAL Maxcine Ham;  Surgeon: Serafina Mitchell, MD;  Location[facility] CATH LAB;  Service: Cardiovascular;  Laterality: N/A;  . ABDOMINAL AORTAGRAM N/A 11/22/2014   Procedure: ABDOMINAL AORTAGRAM;  Surgeon: Serafina Mitchell, MD;  Location: [facility] CATH LAB;  Service: Cardiovascular;  Laterality: N/A;  . ARTERIAL BYPASS SURGRY  07/05/2010   Right Common Femoral to below knee popliteal BPG  . BACK SURGERY     X's  2  . CARDIAC CATHETERIZATION    . CHOLECYSTECTOMY     Gall Bladder  . CYSTECTOMY Right    foot  .  CYSTECTOMY Left    wrist  . INTERCOSTAL NERVE BLOCK  November 2015  . IRRIGATION AND DEBRIDEMENT BUTTOCKS Right 09/30/2016   Procedure: DEBRIDEMENT RIGHT  BUTTOCK WOUND;  Surgeon: Georganna Skeans, MD;  Location: [place];  Service: General;  Laterality: Right;  . left foot surgery    . left wrist cyst removal Left   . PERIPHERAL VASCULAR CATHETERIZATION N/A 05/07/2016   Procedure: Abdominal Aortogram;  Surgeon: Serafina Mitchell, MD;  Location: [place] CV LAB;  Service: Cardiovascular;  Laterality: N/A;  . PERIPHERAL VASCULAR CATHETERIZATION N/A 05/07/2016   Procedure:  Lower Extremity Angiography;  Surgeon: Serafina Mitchell, MD;  Location: [place] CV LAB;  Service: Cardiovascular;  Laterality: N/A;  . PERIPHERAL VASCULAR CATHETERIZATION N/A 05/07/2016   Procedure: Aortic Arch Angiography;  Surgeon: Serafina Mitchell, MD;  Location: [place] CV LAB;  Service: Cardiovascular;  Laterality: N/A;  . PERIPHERAL VASCULAR CATHETERIZATION N/A 05/07/2016   Procedure: Upper Extremity Angiography;  Surgeon: Serafina Mitchell, MD;  Location: [place] CV LAB;  Service: Cardiovascular;  Laterality: N/A;  . PERIPHERAL VASCULAR CATHETERIZATION Right 05/07/2016   Procedure: Peripheral Vascular Balloon Angioplasty;  Surgeon: Serafina Mitchell, MD;  Location: [place] CV LAB;  Service: Cardiovascular;  Laterality: Right;  subclavian  . PERIPHERAL VASCULAR CATHETERIZATION Right 05/07/2016   Procedure: Peripheral Vascular Intervention;  Surgeon: Serafina Mitchell, MD;  Location: [place] CV LAB;  Service: Cardiovascular;  Laterality: Right;  External  Iliac  . SKIN GRAFT Right 2012   [name] by Dr. Nils Pyle- Right and Left Ankle  . SPINE SURGERY    . TONSILLECTOMY      OB History    No data available       Home Medications    Prior to Admission medications   Medication Sig Start Date End Date Taking? Authorizing Provider  albuterol (PROVENTIL) 2 MG tablet Take 2 mg by mouth 3 (three) times daily as needed for shortness of breath.    Yes Historical Provider, MD  aspirin 81 MG tablet Take 81 mg by mouth daily as needed for pain.    Yes Historical Provider, MD  cetirizine (ZYRTEC) 10 MG tablet Take 10 mg by mouth daily as needed for allergies. Reported on 03/27/2016   Yes Historical Provider, MD  Cholecalciferol 1000 units tablet Take 1,000 Units by mouth daily.   Yes Historical Provider, MD  Choline Fenofibrate (FENOFIBRIC ACID) 45 MG CPDR Take 45 mg by mouth daily. Reported on 03/18/2016 10/30/15  Yes Historical Provider, MD  cyclobenzaprine (FLEXERIL) 10 MG tablet Limit 1  tablet by mouth 1  to  3 times per day if tolerated Patient taking differently: Take 10 mg by mouth 3 (three) times daily as needed for muscle spasms.  07/18/16  Yes Mohammed Kindle, MD  dicyclomine (BENTYL) 10 MG capsule Take 10 mg by mouth 4 (four) times daily as needed for spasms. 08/08/16 08/08/17 Yes Historical Provider, MD  furosemide (LASIX) 40 MG tablet Take 40 mg by mouth 2 (two) times daily as needed for fluid.    Yes Historical Provider, MD  HUMALOG KWIKPEN 100 UNIT/ML KiwkPen Inject 0-10 Units into the skin 3 (three) times daily. Reported on 04/08/2016 11/11/15  Yes Historical Provider, MD  HYDROcodone-acetaminophen (NORCO) 10-325 MG tablet Limit 1 tablet by mouth per day or twice per day if tolerated Patient taking differently: Take 1 tablet by mouth 3 (three) times daily.  07/18/16  Yes Mohammed Kindle, MD  LANTUS SOLOSTAR 100 UNIT/ML Solostar Pen Inject  36 Units into the skin 2 (two) times daily.  09/15/14  Yes Historical Provider, MD  loperamide (IMODIUM A-D) 2 MG tablet Take 2 mg by mouth as needed for diarrhea or loose stools.   Yes Historical Provider, MD  meclizine (ANTIVERT) 25 MG tablet Take 25 mg by mouth 3 (three) times daily as needed for dizziness.   Yes Historical Provider, MD  naproxen sodium (ANAPROX) 220 MG tablet Take 220 mg by mouth 2 (two) times daily as needed (pain). Reported on 03/18/2016   Yes Historical Provider, MD  omeprazole (PRILOSEC) 20 MG capsule Take 20 mg by mouth daily as needed (heartburn).    Yes Historical Provider, MD  pregabalin (LYRICA) 100 MG capsule Limit 1 tab by mouth twice a day to 3 times a day if tolerated Patient taking differently: Take 100 mg by mouth 2 (two) times daily.  07/18/16  Yes Mohammed Kindle, MD  pseudoephedrine-guaifenesin [facility]) 60-600 MG per tablet Take 1 tablet by mouth every 12 (twelve) hours as needed for congestion. Reported on 04/08/2016   Yes Historical Provider, MD    Family History Family History  Problem Relation Age of  [place]  . Coronary artery disease Mother   . Peripheral vascular disease Mother   . Heart disease Mother     Before age 52  . Other Mother     Venous insuffiency  . Diabetes Mother   . Hyperlipidemia Mother   . Hypertension Mother   . Varicose Veins Mother   . Heart attack Mother     before age 68  . Heart disease Father   . Diabetes Father   . Diabetes Maternal Grandmother   . Diabetes Paternal Grandmother   . Diabetes Paternal Grandfather   . Diabetes Sister   . Hypertension Sister   . Diabetes Brother   . Hypertension Brother     Social History Social History  Substance Use Topics  . Smoking status: Current Every Day Smoker    Packs/day: 1.50    Years: 30.00    Types: Cigarettes  . Smokeless tobacco: Never Used     Comment: 2 cigarrettes a day  . Alcohol use No     Allergies   Penicillins   Review of Systems Review of Systems 10 Systems reviewed and are negative for acute change except as noted in the HPI.   Physical Exam Updated Vital Signs BP 109/64 (BP Location: Right Arm)   Pulse 87   Temp 97.9 F (36.6 C) (Oral)   Resp 18   Ht 5\' 4"  (1.626 m)   Wt 144 lb 8 oz (65.5 kg)   SpO2 91%   BMI 24.80 kg/m   Physical Exam  Constitutional: She is oriented to person, place, and time.  Patient is very thin, borderline cachectic. The patient is alert without respiratory distress. Pale in appearance.  HENT:  Head: Normocephalic and atraumatic.  Very poor dentition. Mucous members are moist.  Eyes: EOM are normal. Pupils are equal, round, and reactive to light.  Neck: Neck supple.  Cardiovascular: Normal rate, regular rhythm, normal heart sounds and intact distal pulses.   Pulmonary/Chest: Effort normal and breath sounds normal.  Abdominal: Soft. She exhibits no distension. There is no tenderness. There is no guarding.  Musculoskeletal:  Patient does have 2+ pitting edema of the right lower extremity. There are minor, superficial abrasions and mild  erythema. No edema of the left lower extremity.  Neurological: She is alert and oriented to person, place, and time. She  exhibits normal muscle tone. Coordination normal.  Skin: Skin is warm and dry. There is pallor.  Patient has a large decubitus on the right gluteus. Approximately 15 cm diameter. A large portion of it is healing well with granulation tissue that is clean. There still remains approximately a 5 cm deep wound with yellow caseous eschar on it. No cellulitis streaking about the area.  Psychiatric: She has a normal mood and affect.     ED Treatments / Results  Labs (all labs ordered are listed, but only abnormal results are displayed) Labs Reviewed  C DIFFICILE QUICK SCREEN W PCR REFLEX - Abnormal; Notable for the following:       Result Value   C Diff antigen POSITIVE (*)    All other components within normal limits  CLOSTRIDIUM DIFFICILE BY PCR - Abnormal; Notable for the following:    Toxigenic C Difficile by pcr POSITIVE (*)    All other components within normal limits  COMPREHENSIVE METABOLIC PANEL - Abnormal; Notable for the following:    Sodium 134 (*)    Potassium 3.1 (*)    Chloride 95 (*)    Glucose, Bld 220 (*)    Albumin 2.7 (*)    AST 10 (*)    ALT 7 (*)    Total Bilirubin 0.2 (*)    All other components within normal limits  CBC WITH DIFFERENTIAL/PLATELET - Abnormal; Notable for the following:    WBC 17.7 (*)    Hemoglobin 11.8 (*)    HCT 35.7 (*)    Neutro Abs 12.3 (*)    Lymphs Abs 4.1 (*)    Monocytes Absolute 1.1 (*)    All other components within normal limits  CBC - Abnormal; Notable for the following:    WBC 11.5 (*)    All other components within normal limits  BASIC METABOLIC PANEL - Abnormal; Notable for the following:    Potassium 3.3 (*)    Chloride 100 (*)    Glucose, Bld 204 (*)    Calcium 8.2 (*)    All other components within normal limits  BASIC METABOLIC PANEL - Abnormal; Notable for the following:    Chloride 100 (*)     Glucose, Bld 152 (*)    Creatinine, Ser 0.43 (*)    Calcium 8.5 (*)    All other components within normal limits  BASIC METABOLIC PANEL - Abnormal; Notable for the following:    Potassium 5.3 (*)    Glucose, Bld 113 (*)    Creatinine, Ser 0.43 (*)    Calcium 8.4 (*)    All other components within normal limits  GLUCOSE, CAPILLARY - Abnormal; Notable for the following:    Glucose-Capillary 114 (*)    All other components within normal limits  GLUCOSE, CAPILLARY - Abnormal; Notable for the following:    Glucose-Capillary 140 (*)    All other components within normal limits  I-STAT CG4 LACTIC ACID, ED - Abnormal; Notable for the following:    Lactic Acid, Venous 2.25 (*)    All other components within normal limits  CBG MONITORING, ED - Abnormal; Notable for the following:    Glucose-Capillary 204 (*)    All other components within normal limits  CBG MONITORING, ED - Abnormal; Notable for the following:    Glucose-Capillary 156 (*)    All other components within normal limits  CULTURE, BLOOD (ROUTINE X 2)  CULTURE, BLOOD (ROUTINE X 2)  PROTIME-INR  CBC  I-STAT CG4 LACTIC ACID, ED  I-STAT CG4 LACTIC ACID, ED  I-STAT CG4 LACTIC ACID, ED  CBG MONITORING, ED    EKG  EKG Interpretation  Date/Time:  Tuesday November 26 2016 20:55:33 EST Ventricular Rate:  105 PR Interval:  132 QRS Duration: 78 QT Interval:  348 QTC Calculation: 459 R Axis:   84 Text Interpretation:  Sinus tachycardia with occasional Premature ventricular complexes Cannot rule out Anterior infarct , age undetermined Abnormal ECG no STEMI no sig change from old Confirmed by Johnney Killian, MD, Jeannie Done [phone]) on 11/26/2016 8:56:03 PM Also confirmed by Johnney Killian, MD, Jeannie Done [phone]), editor [place]  CCT, [place] ([id number])  on 11/27/2016 7:10:41 AM       Radiology Dg Chest 2 View  Result Date: 11/26/2016 CLINICAL DATA:  Low blood pressure wound on buttock EXAM: CHEST  2 VIEW COMPARISON:  10/01/2016 FINDINGS: The heart size  and mediastinal contours are within normal limits. Both lungs are clear. The visualized skeletal structures are unremarkable. Surgical clips in the right upper quadrant IMPRESSION: No active cardiopulmonary disease. Electronically Signed   By: Donavan Foil M.D.   On: 11/26/2016 21:37    Procedures Procedures (including critical care time) CRITICAL CARE Performed by: Charlesetta Shanks   Total critical care time: 30 minutes  Critical care time was exclusive of separately billable procedures and treating other patients.  Critical care was necessary to treat or prevent imminent or life-threatening deterioration.  Critical care was time spent personally by me on the following activities: development of treatment plan with patient and/or surrogate as well as nursing, discussions with consultants, evaluation of patient's response to treatment, examination of patient, obtaining history from patient or surrogate, ordering and performing treatments and interventions, ordering and review of laboratory studies, ordering and review of radiographic studies, pulse oximetry and re-evaluation of patient's condition. Medications Ordered in ED Medications  0.9 %  sodium chloride infusion (75 mL/hr Intravenous New Bag/Given 11/28/16 0511)  ceFEPIme (MAXIPIME) 1 g in dextrose 5 % 50 mL IVPB (1 g Intravenous New Bag/Given 11/28/16 0824)  vancomycin (VANCOCIN) IVPB 1000 mg/200 mL premix (1,000 mg Intravenous New Bag/Given 11/27/16 2343)  albuterol (PROVENTIL) tablet 2 mg (not administered)  aspirin EC tablet 81 mg (not administered)  pregabalin (LYRICA) capsule 100 mg (100 mg Oral Given 11/28/16 0821)  HYDROcodone-acetaminophen (NORCO) 10-325 MG per tablet 1 tablet (1 tablet Oral Given 11/27/16 2045)  insulin glargine (LANTUS) injection 20 Units (20 Units Subcutaneous Given 11/28/16 0848)  insulin aspart (novoLOG) injection 0-15 Units (2 Units Subcutaneous Given 11/28/16 0821)  insulin aspart (novoLOG) injection 4 Units  (4 Units Subcutaneous Given 11/28/16 0821)  enoxaparin (LOVENOX) injection 40 mg (40 mg Subcutaneous Given 11/27/16 2340)  collagenase (SANTYL) ointment ( Topical Given 11/28/16 0849)  morphine 4 MG/ML injection 1-3 mg (3 mg Intravenous Given 11/28/16 0847)  vancomycin (VANCOCIN) 50 mg/mL oral solution 125 mg (125 mg Oral Given 11/28/16 0822)  sodium chloride 0.9 % bolus 1,947 mL (0 mL/kg  64.9 kg Intravenous Stopped 11/27/16 0059)  potassium chloride SA (K-DUR,KLOR-CON) CR tablet 40 mEq (40 mEq Oral Given 11/26/16 2344)  ceFEPIme (MAXIPIME) 2 g in dextrose 5 % 50 mL IVPB (0 g Intravenous Stopped 11/27/16 0026)  vancomycin (VANCOCIN) IVPB 1000 mg/200 mL premix (0 mg Intravenous Stopped 11/27/16 0106)  loperamide (IMODIUM) capsule 4 mg (4 mg Oral Given 11/27/16 0429)  potassium chloride SA (K-DUR,KLOR-CON) CR tablet 40 mEq (40 mEq Oral Given 11/27/16 1233)     Initial Impression / Assessment and Plan / ED Course  I have reviewed  the triage vital signs and the nursing notes.  Pertinent labs & imaging results that were available during my care of the patient were reviewed by me and considered in my medical decision making (see chart for details).  Clinical Course as of Nov 29 1007  Tue Nov 26, 2016  2137 Patient not brought to room.  [MP]    Clinical Course User Index [MP] Charlesetta Shanks, MD      Final Clinical Impressions(s) / ED Diagnoses   Final diagnoses:  Other specified hypotension  Sepsis, due to unspecified organism [facility])  Patient presents with significant hypotension. She does endorse dizziness. Mental status is clear. No respiratory distress. Patient is tolerating hypotension very well without clinically appearing significantly hypoperfused. Fluid resuscitation initiated. Patient has a chronic decubitus ulcer. Based on history and physical examination this is most likely etiology.   New Prescriptions Current Discharge Medication List       Charlesetta Shanks, MD 11/28/16 1013

## 2016-11-26 NOTE — ED Notes (Signed)
Pt aware of need of urine sample. Bedside commode left in room

## 2016-11-26 NOTE — ED Triage Notes (Addendum)
Pt from [facility], pt sent here for BP 69/46 and dizziness that has been going on "a long time" Pt here for sepsis infection in November and had surgery. Pt hypostensive in triage 62/39. Pt had recent URI and completed antibiotics yesterday. Pt alert and oriented x 4 but weak. Pt goes to pain clinic and took hydrocodone twice today.

## 2016-11-27 DIAGNOSIS — M549 Dorsalgia, unspecified: Secondary | ICD-10-CM

## 2016-11-27 DIAGNOSIS — A419 Sepsis, unspecified organism: Secondary | ICD-10-CM | POA: Diagnosis present

## 2016-11-27 DIAGNOSIS — L89159 Pressure ulcer of sacral region, unspecified stage: Secondary | ICD-10-CM | POA: Diagnosis present

## 2016-11-27 DIAGNOSIS — M545 Low back pain: Secondary | ICD-10-CM

## 2016-11-27 DIAGNOSIS — E11628 Type 2 diabetes mellitus with other skin complications: Secondary | ICD-10-CM | POA: Diagnosis not present

## 2016-11-27 DIAGNOSIS — E119 Type 2 diabetes mellitus without complications: Secondary | ICD-10-CM

## 2016-11-27 DIAGNOSIS — R6521 Severe sepsis with septic shock: Secondary | ICD-10-CM

## 2016-11-27 DIAGNOSIS — I951 Orthostatic hypotension: Secondary | ICD-10-CM | POA: Diagnosis present

## 2016-11-27 DIAGNOSIS — G8929 Other chronic pain: Secondary | ICD-10-CM | POA: Diagnosis present

## 2016-11-27 LAB — GLUCOSE, CAPILLARY: Glucose-Capillary: 114 mg/dL — ABNORMAL HIGH (ref 65–99)

## 2016-11-27 LAB — CLOSTRIDIUM DIFFICILE BY PCR: CDIFFPCR: POSITIVE — AB

## 2016-11-27 LAB — BASIC METABOLIC PANEL
ANION GAP: 9 (ref 5–15)
Anion gap: 9 (ref 5–15)
BUN: 10 mg/dL (ref 6–20)
BUN: 8 mg/dL (ref 6–20)
CALCIUM: 8.5 mg/dL — AB (ref 8.9–10.3)
CHLORIDE: 100 mmol/L — AB (ref 101–111)
CO2: 28 mmol/L (ref 22–32)
CO2: 30 mmol/L (ref 22–32)
CREATININE: 0.58 mg/dL (ref 0.44–1.00)
Calcium: 8.2 mg/dL — ABNORMAL LOW (ref 8.9–10.3)
Chloride: 100 mmol/L — ABNORMAL LOW (ref 101–111)
Creatinine, Ser: 0.43 mg/dL — ABNORMAL LOW (ref 0.44–1.00)
GFR calc Af Amer: >60 mL/min (ref >60)
GFR calc Af Amer: >60 mL/min (ref >60)
GFR calc non Af Amer: >60 mL/min (ref >60)
GLUCOSE: 204 mg/dL — AB (ref 65–99)
GLUCOSE: 152 mg/dL — AB (ref 65–99)
POTASSIUM: 4.2 mmol/L (ref 3.5–5.1)
Potassium: 3.3 mmol/L — ABNORMAL LOW (ref 3.5–5.1)
SODIUM: 137 mmol/L (ref 135–145)
SODIUM: 139 mmol/L (ref 135–145)

## 2016-11-27 LAB — CBG MONITORING, ED
GLUCOSE-CAPILLARY: 204 mg/dL — AB (ref 65–99)
GLUCOSE-CAPILLARY: 84 mg/dL (ref 65–99)
GLUCOSE-CAPILLARY: 156 mg/dL — AB (ref 65–99)

## 2016-11-27 LAB — I-STAT CG4 LACTIC ACID, ED
LACTIC ACID, VENOUS: 0.72 mmol/L (ref 0.5–1.9)
Lactic Acid, Venous: 1.14 mmol/L (ref 0.5–1.9)

## 2016-11-27 LAB — CBC
HCT: 37.4 % (ref 36.0–46.0)
Hemoglobin: 12.2 g/dL (ref 12.0–15.0)
MCH: 26.5 pg (ref 26.0–34.0)
MCHC: 32.6 g/dL (ref 30.0–36.0)
MCV: 81.3 fL (ref 78.0–100.0)
PLATELETS: 309 10*3/uL (ref 150–400)
RBC: 4.6 MIL/uL (ref 3.87–5.11)
RDW: 14.5 % (ref 11.5–15.5)
WBC: 11.5 10*3/uL — AB (ref 4.0–10.5)

## 2016-11-27 LAB — C DIFFICILE QUICK SCREEN W PCR REFLEX
C Diff antigen: POSITIVE — AB
C Diff toxin: NEGATIVE

## 2016-11-27 MED ORDER — INSULIN ASPART 100 UNIT/ML ~~LOC~~ SOLN
0.0000 [IU] | Freq: Three times a day (TID) | SUBCUTANEOUS | Status: DC
Start: 2016-11-27 — End: 2016-11-28
  Administered 2016-11-27: 3 [IU] via SUBCUTANEOUS
  Administered 2016-11-27: 5 [IU] via SUBCUTANEOUS
  Administered 2016-11-28: 2 [IU] via SUBCUTANEOUS
  Administered 2016-11-28: 5 [IU] via SUBCUTANEOUS
  Filled 2016-11-27 (×2): qty 1

## 2016-11-27 MED ORDER — SODIUM CHLORIDE 0.9 % IV SOLN
INTRAVENOUS | Status: DC
  Administered 2016-11-27 (×2): via INTRAVENOUS
  Administered 2016-11-28: 75 mL/h via INTRAVENOUS

## 2016-11-27 MED ORDER — INSULIN GLARGINE 100 UNIT/ML ~~LOC~~ SOLN
20.0000 [IU] | Freq: Two times a day (BID) | SUBCUTANEOUS | Status: DC
  Administered 2016-11-27 – 2016-11-28 (×3): 20 [IU] via SUBCUTANEOUS
  Filled 2016-11-27 (×6): qty 0.2

## 2016-11-27 MED ORDER — MORPHINE SULFATE (PF) 4 MG/ML IV SOLN
1.0000 mg | INTRAVENOUS | Status: DC | PRN
  Administered 2016-11-28: 3 mg via INTRAVENOUS
  Administered 2016-11-28 (×2): 2 mg via INTRAVENOUS
  Filled 2016-11-27 (×3): qty 1

## 2016-11-27 MED ORDER — COLLAGENASE 250 UNIT/GM EX OINT
TOPICAL_OINTMENT | Freq: Every day | CUTANEOUS | Status: DC
  Administered 2016-11-27 – 2016-11-28 (×2): via TOPICAL
  Administered 2016-11-28: 1 via TOPICAL
  Filled 2016-11-27 (×2): qty 30

## 2016-11-27 MED ORDER — POTASSIUM CHLORIDE CRYS ER 20 MEQ PO TBCR
40.0000 meq | EXTENDED_RELEASE_TABLET | ORAL | Status: AC
  Administered 2016-11-27 (×2): 40 meq via ORAL
  Filled 2016-11-27 (×2): qty 2

## 2016-11-27 MED ORDER — ENOXAPARIN SODIUM 40 MG/0.4ML ~~LOC~~ SOLN
40.0000 mg | SUBCUTANEOUS | Status: DC
  Filled 2016-11-27: qty 0.4

## 2016-11-27 MED ORDER — PREGABALIN 50 MG PO CAPS
100.0000 mg | ORAL_CAPSULE | Freq: Three times a day (TID) | ORAL | Status: DC
  Administered 2016-11-27 – 2016-11-28 (×4): 100 mg via ORAL
  Filled 2016-11-27 (×5): qty 2

## 2016-11-27 MED ORDER — LOPERAMIDE HCL 2 MG PO CAPS
4.0000 mg | ORAL_CAPSULE | Freq: Once | ORAL | Status: AC
  Administered 2016-11-27: 4 mg via ORAL
  Filled 2016-11-27: qty 2

## 2016-11-27 MED ORDER — ENOXAPARIN SODIUM 40 MG/0.4ML ~~LOC~~ SOLN
40.0000 mg | SUBCUTANEOUS | Status: DC
  Administered 2016-11-27 (×2): 40 mg via SUBCUTANEOUS
  Filled 2016-11-27 (×2): qty 0.4

## 2016-11-27 MED ORDER — VANCOMYCIN 50 MG/ML ORAL SOLUTION
125.0000 mg | Freq: Four times a day (QID) | ORAL | Status: DC
Start: 2016-11-27 — End: 2016-11-28
  Administered 2016-11-27 – 2016-11-28 (×2): 125 mg via ORAL
  Filled 2016-11-27 (×4): qty 2.5

## 2016-11-27 MED ORDER — VANCOMYCIN HCL IN DEXTROSE 1-5 GM/200ML-% IV SOLN
1000.0000 mg | Freq: Two times a day (BID) | INTRAVENOUS | Status: DC
  Administered 2016-11-27 (×2): 1000 mg via INTRAVENOUS
  Filled 2016-11-27 (×3): qty 200

## 2016-11-27 MED ORDER — DEXTROSE 5 % IV SOLN
1.0000 g | Freq: Three times a day (TID) | INTRAVENOUS | Status: DC
  Administered 2016-11-27 – 2016-11-28 (×4): 1 g via INTRAVENOUS
  Filled 2016-11-27 (×7): qty 1

## 2016-11-27 MED ORDER — INSULIN ASPART 100 UNIT/ML ~~LOC~~ SOLN
4.0000 [IU] | Freq: Three times a day (TID) | SUBCUTANEOUS | Status: DC
  Administered 2016-11-27 – 2016-11-28 (×4): 4 [IU] via SUBCUTANEOUS
  Filled 2016-11-27 (×2): qty 1

## 2016-11-27 MED ORDER — ALBUTEROL SULFATE 2 MG PO TABS: 2.0000 mg | ORAL_TABLET | Freq: Three times a day (TID) | ORAL | Status: DC | PRN

## 2016-11-27 MED ORDER — ASPIRIN EC 81 MG PO TBEC: 81.0000 mg | DELAYED_RELEASE_TABLET | Freq: Every day | ORAL | Status: DC | PRN

## 2016-11-27 MED ORDER — HYDROCODONE-ACETAMINOPHEN 10-325 MG PO TABS
1.0000 | ORAL_TABLET | Freq: Three times a day (TID) | ORAL | Status: DC | PRN
  Administered 2016-11-27 (×3): 1 via ORAL
  Filled 2016-11-27 (×3): qty 1

## 2016-11-27 NOTE — Progress Notes (Signed)
Physical Therapy Wound Evaluation/Treatment Patient Details  Name: [name] MRN: [id number] Date of Birth: [birth year]  Today's Date: 11/27/2016 Time:  -     Subjective  Subjective: Pt reporting she is cold and in pain. RN notified that pt requesting pain meds, and extra blanket applied during treatment.  Patient and Family Stated Goals: Heal wound Date of Onset:  (Hx of necrotizing fasciitis ) Prior Treatments: Bedside debridement this session.   Pain Score: Pt tearful and requesting pain meds during session.   Wound Assessment  Wound / Incision (Open or Dehisced) 11/27/16 Non-pressure wound Buttocks Right (Active)  Dressing Type ABD;Barrier Film (skin prep);Gauze (Comment);Moist to dry 11/27/2016  2:51 PM  Dressing Changed Changed 11/27/2016  2:51 PM  Dressing Status Clean;Dry;Intact 11/27/2016  2:51 PM  Dressing Change Frequency Daily 11/27/2016  2:51 PM  Site / Wound Assessment Red;Yellow 11/27/2016  2:51 PM  % Wound base Red or Granulating 75% 11/27/2016  2:51 PM  % Wound base Yellow/Fibrinous Exudate 25% 11/27/2016  2:51 PM  % Wound base Black/Eschar 0% 11/27/2016  2:51 PM  Peri-wound Assessment Intact 11/27/2016  2:51 PM  Wound Length (cm) 11 cm 11/27/2016  2:51 PM  Wound Width (cm) 8 cm 11/27/2016  2:51 PM  Wound Depth (cm) 0.1 cm 11/27/2016  2:51 PM  Margins Epibole (rolled edges) 11/27/2016  2:51 PM  Closure None 11/27/2016  2:51 PM  Drainage Amount Moderate 11/27/2016  2:51 PM  Drainage Description Serosanguineous 11/27/2016  2:51 PM  Non-staged Wound Description Full thickness 11/27/2016  2:51 PM  Treatment Debridement (Selective);Hydrotherapy (Pulse lavage);Packing (Saline gauze) 11/27/2016  2:51 PM   Hydrotherapy Pulsed lavage therapy - wound location: R buttock Pulsed Lavage with Suction (psi): 4 psi (for pain control) Pulsed Lavage with Suction - Normal Saline Used: 1000 mL Pulsed Lavage Tip: Tip with splash shield Selective Debridement Selective Debridement - Location: R  buttock Selective Debridement - Tools Used: Forceps;Scissors Selective Debridement - Tissue Removed: yellow necrotic tissue   Wound Assessment and Plan  Wound Therapy - Assess/Plan/Recommendations Wound Therapy - Clinical Statement: Pt presents to hydrotherapy with a R buttock wound s/p bedside debridement. Will benefit from continued hydrotherapy for selective removal of nonviable tissue and to promote wound bed healing.  Wound Therapy - Functional Problem List: Decreased tolerance for OOB Factors Delaying/Impairing Wound Healing: Diabetes Mellitus;Infection - systemic/local Hydrotherapy Plan: Debridement;Dressing change;Patient/family education;Pulsatile lavage with suction Wound Therapy - Frequency: 6X / week Wound Therapy - Follow Up Recommendations: Home health RN Wound Plan: See above  Wound Therapy Goals- Improve the function of patient's integumentary system by progressing the wound(s) through the phases of wound healing (inflammation - proliferation - remodeling) by: Decrease Necrotic Tissue to: 0% Decrease Necrotic Tissue - Progress: Goal set today Increase Granulation Tissue to: 100% Increase Granulation Tissue - Progress: Goal set today Goals/treatment plan/discharge plan were made with and agreed upon by patient/family: Yes Time For Goal Achievement: 7 days Wound Therapy - Potential for Goals: Good  Goals will be updated until maximal potential achieved or discharge criteria met.  Discharge criteria: when goals achieved, discharge from hospital, MD decision/surgical intervention, no progress towards goals, refusal/missing three consecutive treatments without notification or medical reason.  GP     Thelma Comp 11/27/2016, 3:03 PM  Rolinda Roan, PT, DPT Acute Rehabilitation Services Pager: [phone]

## 2016-11-27 NOTE — Progress Notes (Signed)
[name]  51 y/o who presents with orthostatic hypotension, complaints of dizziness and hypotension.   Principal Problem: Orthostatic hypotension  - on Lasix 40 BID PRN (took 2 tabs over past few days) which is on hold for now - cont slow IVF  Active Problems:     Sacral decubitus ulcer - recent necrotizing fascitis - difficult to tell if this is infected- WBC 17- have asked gen surgery to determine if she needs further surgical debridement -cont antibiotics for now    DM2 (diabetes mellitus, type 2) -  Lantus and SSI    Chronic back pain - due to ulcer - Hydrocodone, Morphine  Jamie Odea, MD

## 2016-11-27 NOTE — ED Notes (Addendum)
[name] and me helped get PT moved to [facility] bed

## 2016-11-27 NOTE — Consult Note (Signed)
[place] Nurse wound consult note Reason for Consult: buttock wound, known to this [place] nurse from previous admission. Surgically debrided per [facility] team for nec fasciitis.  In with hypotension. Assessment of wound with Will PA at the bedside.  He provided CSWD at the bedside for the right buttock wound.  Patient is noted to have a chronic wound medial malleolus ________ ankle. Wound type:  Surgical wound right buttock: 11x 8cm x 0.1cm  Ankle wound: 2cm x 1.5cm x 0.5cm  Pressure Injury POA No Measurement: see above  Wound bed: Right buttock: 25% yellow;75% pink, non granulating  Ankle: 50% yellow; 50% pink, non granulating Drainage (amount, consistency, odor) unable to assess buttock, dressing off at the time of my assessment, minimal yellow from the ankle wound, non purulent Periwound:intact at both sites Dressing procedure/placement/frequency: PT for hydrotherapy to start today VO per Will [place] PA with [facility] team, moist saline gauze and ABD pad to be applied after treatment.  WOC contacted rehab department with new order. Enzymatic debridement to the ankle wound daily, cover with saline moist dressing. Top with dry dressing. Change daily.  Low air loss mattress for pressure redistribution upon transfer to the floor.  [place] team will follow along with PT for weekly wound assessments to determine when tx can be discontinued.  Nicolena Schurman [facility] MSN, [place], [employer], [place]

## 2016-11-27 NOTE — Care Management Note (Addendum)
Case Management Note  Patient Details  Name: [name] MRN: [id number] Date of Birth: [birth year]  Subjective/Objective:                  From home with spouse. /50 y.o. female with medical history significant of necrotizing fascitis of sacrum in Nov, DM2, chronic back pain.  Patient presents to the ED with c/o dizziness and lightheadedness.  Action/Plan: Follow for disposition needs. /Admit status INPATIENT ([facility] HTN); anticipate discharge [place].    Expected Discharge Date:  11/30/16               Expected Discharge Plan:  Resumption of Care  In-House Referral:  NA  Discharge planning Services  CM Consult   Status of Service:  In process, will continue to follow  If discussed at Long Length of Stay Meetings, dates discussed:    Additional Comments: Pt currently active with [facility] for RN/PT/OT/NA services.  Resumption of care requested. Edwinna Areola, RN of [facility] notified.  No DME needs identified at this time.  Fuller Mandril, RN 11/27/2016, 9:41 AM

## 2016-11-27 NOTE — Consult Note (Signed)
Reason for Consult:  Decubitus ulcer Referring Physician: S Rizwan  [name] is an 51 y.o. female.  HPI: We are asked to see the patient for decubitus. She previously hospitalized 09/30/16 through 10/06/16. She was seen and taken to the operating room for necrotizing fasciitis of the right buttocks. She underwent debridement right buttocks wound; 16 mm long by 11 cm wide by 3 cm deep. Hospitalization was complicated with a NSTEMI, right-sided weakness with stroke like symptoms. She also has history of chronic pain, tobacco abuse, type 2 diabetes, right lower extremity lateral malleolus eschar and asthma. Pressure dressing was applied during her hospitalization. Cultures grew out abundance Viridans streptococcus. She presented to the ED this morning with a blood pressure 69/46 and dizziness. History of recent URI and completed antibiotics yesterday. She goes the pain clinic and took hydrocodone twice yesterday also. She is currently followed by the wound care clinic. She's had the wound VAC removed. She has one area of necrosis that Dr. Nils Pyle has been working on in the clinic. Her last debridement was approximately 2 weeks ago. We are asked to see.     Past Medical History:  Diagnosis Date  . Arthritis   . Asthma   . Chronic back pain   . Depression   . Diabetes mellitus   . Hyperlipidemia   . Joint pain   . Leg pain    With Walking  . PAD (peripheral artery disease) ([place])   . Peripheral arterial disease ([place])   . Reflux   . Ulcer ([place])    Foot    Past Surgical History:  Procedure Laterality Date  . ABDOMINAL AORTAGRAM  June 15, 2014  . ABDOMINAL AORTAGRAM N/A 06/15/2014   Procedure: ABDOMINAL Maxcine Ham;  Surgeon: Serafina Mitchell, MD;  Location: [facility] CATH LAB;  Service: Cardiovascular;  Laterality: N/A;  . ABDOMINAL AORTAGRAM N/A 11/22/2014   Procedure: ABDOMINAL AORTAGRAM;  Surgeon: Serafina Mitchell, MD;  Location: [facility] CATH LAB;  Service: Cardiovascular;  Laterality: N/A;  .  ARTERIAL BYPASS SURGRY  07/05/2010   Right Common Femoral to below knee popliteal BPG  . BACK SURGERY     X's  2  . CARDIAC CATHETERIZATION    . CHOLECYSTECTOMY     Gall Bladder  . CYSTECTOMY Right    foot  . CYSTECTOMY Left    wrist  . INTERCOSTAL NERVE BLOCK  November 2015  . IRRIGATION AND DEBRIDEMENT BUTTOCKS Right 09/30/2016   Procedure: DEBRIDEMENT RIGHT  BUTTOCK WOUND;  Surgeon: Georganna Skeans, MD;  Location: [place];  Service: General;  Laterality: Right;  . left foot surgery    . left wrist cyst removal Left   . PERIPHERAL VASCULAR CATHETERIZATION N/A 05/07/2016   Procedure: Abdominal Aortogram;  Surgeon: Serafina Mitchell, MD;  Location: [place] CV LAB;  Service: Cardiovascular;  Laterality: N/A;  . PERIPHERAL VASCULAR CATHETERIZATION N/A 05/07/2016   Procedure: Lower Extremity Angiography;  Surgeon: Serafina Mitchell, MD;  Location: [place] CV LAB;  Service: Cardiovascular;  Laterality: N/A;  . PERIPHERAL VASCULAR CATHETERIZATION N/A 05/07/2016   Procedure: Aortic Arch Angiography;  Surgeon: Serafina Mitchell, MD;  Location: [place] CV LAB;  Service: Cardiovascular;  Laterality: N/A;  . PERIPHERAL VASCULAR CATHETERIZATION N/A 05/07/2016   Procedure: Upper Extremity Angiography;  Surgeon: Serafina Mitchell, MD;  Location: [place] CV LAB;  Service: Cardiovascular;  Laterality: N/A;  . PERIPHERAL VASCULAR CATHETERIZATION Right 05/07/2016   Procedure: Peripheral Vascular Balloon Angioplasty;  Surgeon: Serafina Mitchell, MD;  Location: [place] CV LAB;  Service: Cardiovascular;  Laterality: Right;  subclavian  . PERIPHERAL VASCULAR CATHETERIZATION Right 05/07/2016   Procedure: Peripheral Vascular Intervention;  Surgeon: Serafina Mitchell, MD;  Location: [place] CV LAB;  Service: Cardiovascular;  Laterality: Right;  External  Iliac  . SKIN GRAFT Right 2012   [name] by Dr. Nils Pyle- Right and Left Ankle  . SPINE SURGERY    . TONSILLECTOMY      Family History  Problem Relation Age  of Onset  . Coronary artery disease Mother   . Peripheral vascular disease Mother   . Heart disease Mother     Before age 69  . Other Mother     Venous insuffiency  . Diabetes Mother   . Hyperlipidemia Mother   . Hypertension Mother   . Varicose Veins Mother   . Heart attack Mother     before age 74  . Heart disease Father   . Diabetes Father   . Diabetes Maternal Grandmother   . Diabetes Paternal Grandmother   . Diabetes Paternal Grandfather   . Diabetes Sister   . Hypertension Sister   . Diabetes Brother   . Hypertension Brother     Social History:  reports that she has been smoking Cigarettes.  She has a 45.00 pack-year smoking history. She has never used smokeless tobacco. She reports that she does not drink alcohol or use drugs.  Allergies:  Allergies  Allergen Reactions  . Penicillins Other (See Comments)    Severe Headache    Prior to Admission medications   Medication Sig Start Date End Date Taking? Authorizing Provider  albuterol (PROVENTIL) 2 MG tablet Take 2 mg by mouth 3 (three) times daily as needed for shortness of breath.    Yes Historical Provider, MD  aspirin 81 MG tablet Take 81 mg by mouth daily as needed for pain.    Yes Historical Provider, MD  cetirizine (ZYRTEC) 10 MG tablet Take 10 mg by mouth daily as needed for allergies. Reported on 03/27/2016   Yes Historical Provider, MD  Cholecalciferol 1000 units tablet Take 1,000 Units by mouth daily.   Yes Historical Provider, MD  Choline Fenofibrate (FENOFIBRIC ACID) 45 MG CPDR Take 45 mg by mouth daily. Reported on 03/18/2016 10/30/15  Yes Historical Provider, MD  cyclobenzaprine (FLEXERIL) 10 MG tablet Limit 1 tablet by mouth 1  to  3 times per day if tolerated Patient taking differently: Take 10 mg by mouth 3 (three) times daily as needed for muscle spasms.  07/18/16  Yes Mohammed Kindle, MD  dicyclomine (BENTYL) 10 MG capsule Take 10 mg by mouth 4 (four) times daily as needed for spasms. 08/08/16 08/08/17 Yes  Historical Provider, MD  furosemide (LASIX) 40 MG tablet Take 40 mg by mouth 2 (two) times daily as needed for fluid.    Yes Historical Provider, MD  HUMALOG KWIKPEN 100 UNIT/ML KiwkPen Inject 0-10 Units into the skin 3 (three) times daily. Reported on 04/08/2016 11/11/15  Yes Historical Provider, MD  HYDROcodone-acetaminophen (NORCO) 10-325 MG tablet Limit 1 tablet by mouth per day or twice per day if tolerated Patient taking differently: Take 1 tablet by mouth 3 (three) times daily.  07/18/16  Yes Mohammed Kindle, MD  LANTUS SOLOSTAR 100 UNIT/ML Solostar Pen Inject 36 Units into the skin 2 (two) times daily.  09/15/14  Yes Historical Provider, MD  loperamide (IMODIUM A-D) 2 MG tablet Take 2 mg by mouth as needed for diarrhea or loose stools.   Yes  Historical Provider, MD  meclizine (ANTIVERT) 25 MG tablet Take 25 mg by mouth 3 (three) times daily as needed for dizziness.   Yes Historical Provider, MD  naproxen sodium (ANAPROX) 220 MG tablet Take 220 mg by mouth 2 (two) times daily as needed (pain). Reported on 03/18/2016   Yes Historical Provider, MD  omeprazole (PRILOSEC) 20 MG capsule Take 20 mg by mouth daily as needed (heartburn).    Yes Historical Provider, MD  pregabalin (LYRICA) 100 MG capsule Limit 1 tab by mouth twice a day to 3 times a day if tolerated Patient taking differently: Take 100 mg by mouth 2 (two) times daily.  07/18/16  Yes Mohammed Kindle, MD  pseudoephedrine-guaifenesin [facility] D) 60-600 MG per tablet Take 1 tablet by mouth every 12 (twelve) hours as needed for congestion. Reported on 04/08/2016   Yes Historical Provider, MD     Results for orders placed or performed during the hospital encounter of 11/26/16 (from the past 48 hour(s))  Comprehensive metabolic panel     Status: Abnormal   Collection Time: 11/26/16  9:14 PM  Result Value Ref Range   Sodium 134 (L) 135 - 145 mmol/L   Potassium 3.1 (L) 3.5 - 5.1 mmol/L   Chloride 95 (L) 101 - 111 mmol/L   CO2 27 22 - 32 mmol/L    Glucose, Bld 220 (H) 65 - 99 mg/dL   BUN 13 6 - 20 mg/dL   Creatinine, Ser 0.75 0.44 - 1.00 mg/dL   Calcium 8.9 8.9 - 10.3 mg/dL   Total Protein 6.8 6.5 - 8.1 g/dL   Albumin 2.7 (L) 3.5 - 5.0 g/dL   AST 10 (L) 15 - 41 U/L   ALT 7 (L) 14 - 54 U/L   Alkaline Phosphatase 84 38 - 126 U/L   Total Bilirubin 0.2 (L) 0.3 - 1.2 mg/dL   GFR calc non Af Amer >60 >60 mL/min   GFR calc Af Amer >60 >60 mL/min    Comment: (NOTE) The eGFR has been calculated using the CKD EPI equation. This calculation has not been validated in all clinical situations. eGFR's persistently <60 mL/min signify possible Chronic Kidney Disease.    Anion gap 12 5 - 15  CBC with Differential     Status: Abnormal   Collection Time: 11/26/16  9:14 PM  Result Value Ref Range   WBC 17.7 (H) 4.0 - 10.5 K/uL   RBC 4.35 3.87 - 5.11 MIL/uL   Hemoglobin 11.8 (L) 12.0 - 15.0 g/dL   HCT 35.7 (L) 36.0 - 46.0 %   MCV 82.1 78.0 - 100.0 fL   MCH 27.1 26.0 - 34.0 pg   MCHC 33.1 30.0 - 36.0 g/dL   RDW 15.0 11.5 - 15.5 %   Platelets 360 150 - 400 K/uL   Neutrophils Relative % 70 %   Neutro Abs 12.3 (H) 1.7 - 7.7 K/uL   Lymphocytes Relative 23 %   Lymphs Abs 4.1 (H) 0.7 - 4.0 K/uL   Monocytes Relative 6 %   Monocytes Absolute 1.1 (H) 0.1 - 1.0 K/uL   Eosinophils Relative 1 %   Eosinophils Absolute 0.2 0.0 - 0.7 K/uL   Basophils Relative 0 %   Basophils Absolute 0.0 0.0 - 0.1 K/uL  Protime-INR     Status: None   Collection Time: 11/26/16  9:14 PM  Result Value Ref Range   Prothrombin Time 13.8 11.4 - 15.2 seconds   INR 1.06   I-Stat CG4 Lactic Acid, ED  Status: Abnormal   Collection Time: 11/26/16  9:25 PM  Result Value Ref Range   Lactic Acid, Venous 2.25 (HH) 0.5 - 1.9 mmol/L   Comment NOTIFIED PHYSICIAN   I-Stat CG4 Lactic Acid, ED  (not at  [facility])     Status: None   Collection Time: 11/26/16 10:50 PM  Result Value Ref Range   Lactic Acid, Venous 1.53 0.5 - 1.9 mmol/L  I-Stat CG4 Lactic Acid, ED     Status: None    Collection Time: 11/27/16 12:13 AM  Result Value Ref Range   Lactic Acid, Venous 0.72 0.5 - 1.9 mmol/L  CBC     Status: Abnormal   Collection Time: 11/27/16  6:47 AM  Result Value Ref Range   WBC 11.5 (H) 4.0 - 10.5 K/uL   RBC 4.60 3.87 - 5.11 MIL/uL   Hemoglobin 12.2 12.0 - 15.0 g/dL   HCT 37.4 36.0 - 46.0 %   MCV 81.3 78.0 - 100.0 fL   MCH 26.5 26.0 - 34.0 pg   MCHC 32.6 30.0 - 36.0 g/dL   RDW 14.5 11.5 - 15.5 %   Platelets 309 150 - 400 K/uL  Basic metabolic panel     Status: Abnormal   Collection Time: 11/27/16  6:47 AM  Result Value Ref Range   Sodium 137 135 - 145 mmol/L   Potassium 3.3 (L) 3.5 - 5.1 mmol/L   Chloride 100 (L) 101 - 111 mmol/L   CO2 28 22 - 32 mmol/L   Glucose, Bld 204 (H) 65 - 99 mg/dL   BUN 10 6 - 20 mg/dL   Creatinine, Ser 0.58 0.44 - 1.00 mg/dL   Calcium 8.2 (L) 8.9 - 10.3 mg/dL   GFR calc non Af Amer >60 >60 mL/min   GFR calc Af Amer >60 >60 mL/min    Comment: (NOTE) The eGFR has been calculated using the CKD EPI equation. This calculation has not been validated in all clinical situations. eGFR's persistently <60 mL/min signify possible Chronic Kidney Disease.    Anion gap 9 5 - 15  I-Stat CG4 Lactic Acid, ED  (not at  [facility])     Status: None   Collection Time: 11/27/16  6:58 AM  Result Value Ref Range   Lactic Acid, Venous 1.14 0.5 - 1.9 mmol/L  CBG monitoring, ED     Status: Abnormal   Collection Time: 11/27/16  7:25 AM  Result Value Ref Range   Glucose-Capillary 204 (H) 65 - 99 mg/dL   Comment 1 Notify RN    Comment 2 Document in Chart     Dg Chest 2 View  Result Date: 11/26/2016 CLINICAL DATA:  Low blood pressure wound on buttock EXAM: CHEST  2 VIEW COMPARISON:  10/01/2016 FINDINGS: The heart size and mediastinal contours are within normal limits. Both lungs are clear. The visualized skeletal structures are unremarkable. Surgical clips in the right upper quadrant IMPRESSION: No active cardiopulmonary disease. Electronically Signed   By:  Donavan Foil M.D.   On: 11/26/2016 21:37    Review of Systems  Constitutional: Negative.   HENT: Negative.   Eyes: Negative.   Respiratory: Negative.   Cardiovascular: Negative.   Gastrointestinal: Positive for heartburn. Negative for abdominal pain, constipation, diarrhea, nausea and vomiting.  Genitourinary: Negative.   Musculoskeletal: Positive for back pain and neck pain.       Pain with touching her wound.  Skin: Negative.   Neurological: Positive for headaches.  Endo/Heme/Allergies: Negative.   Psychiatric/Behavioral: The patient is  nervous/anxious.        Chronic pain meds for back and neck    Blood pressure (!) 102/53, pulse 89, temperature 98.3 F (36.8 C), temperature source Oral, resp. rate 16, height '5\' 7"'  (1.702 m), weight 64.9 kg (143 lb), SpO2 96 %. Physical Exam  Constitutional: She is oriented to person, place, and time. No distress.  Well-developed, poorly kept, chronically ill-appearing female. Extremely anxious and intolerant of any examination of her open wound.  HENT:  Head: Normocephalic and atraumatic.  Dentition in poor repair  Eyes: Right eye exhibits no discharge. Left eye exhibits no discharge. No scleral icterus.  Neck: Normal range of motion. Neck supple. No JVD present. No tracheal deviation present. No thyromegaly present.  Cardiovascular: Normal rate, regular rhythm, normal heart sounds and intact distal pulses.   No murmur heard. Respiratory: Effort normal and breath sounds normal. No respiratory distress. She has no wheezes. She has no rales. She exhibits no tenderness.  GI: Soft. Bowel sounds are normal. She exhibits no distension and no mass. There is no tenderness. There is no rebound and no guarding.  Musculoskeletal: She exhibits no edema.  Lymphadenopathy:    She has no cervical adenopathy.  Neurological: She is alert and oriented to person, place, and time. No cranial nerve deficit.  Skin: Skin is warm and dry. No rash noted. She is not  diaphoretic. No erythema. No pallor.  Psychiatric: She has a normal mood and affect. Her behavior is normal. Judgment and thought content normal.  Crying with touching or any examination of her right buttocks wound.   After some debridement and cleaning up of the wound   Wound after the dressing was taken down    Assessment/Plan: Hypotension with possible sepsis Hx of necrotizing fasciitis of the right buttocks; status post debridement 09/30/16, Dr. Georganna Skeans Chronic back and neck pain - chronic pain management Type 2 diabetes Asthma Ongoing tobacco use PAD/status post right fem-tib below-knee popliteal bypass grafting  Plan:  Hydrotherapy, and BID wet to dry dressing changes, We will see if this cleans up with local wound care.  Pain is a big issue, she reports pain even when touching with a Q tip.   Ludwin Flahive 11/27/2016, 9:24 AM

## 2016-11-27 NOTE — Progress Notes (Signed)
Pharmacy Antibiotic Note  [name] is a 51 y.o. female admitted on 11/26/2016 with a wound infection.  Pharmacy has been consulted for cefepime and vancomycin  Dosing. -WBC= 17.7, afebrile, SCr- 0.75 and CrCl ~ 80 -vancomycin 1gm and cefepime 2gm have been given in the ED (both around midnight)  Plan: -Cefepime 1gm IV q8h -Vancomycin 1000mg  IV q12h -Will follow renal function, cultures and clinical progress    Height: 5\' 7"  (170.2 cm) Weight: 143 lb (64.9 kg) IBW/kg (Calculated) : 61.6  Temp (24hrs), Avg:98.3 F (36.8 C), Min:98.3 F (36.8 C), Max:98.3 F (36.8 C)   Recent Labs Lab 11/26/16 2114 11/26/16 2125 11/26/16 2250 11/27/16 0013  WBC 17.7*  --   --   --   CREATININE 0.75  --   --   --   LATICACIDVEN  --  2.25* 1.53 0.72    Estimated Creatinine Clearance: 80 mL/min (by C-G formula based on SCr of 0.75 mg/dL).    Allergies  Allergen Reactions  . Penicillins Other (See Comments)    Severe Headache     Thank you for allowing pharmacy to be a part of this patient's care.  Hildred Laser, Pharm D 11/27/2016 12:23 AM

## 2016-11-27 NOTE — H&P (Signed)
History and Physical    [name] NTZ:[id number] DOB: 08/11/1966 DOA: 11/26/2016   PCP: [name], MD Chief Complaint:  Chief Complaint  Patient presents with  . Hypotension  . Dizziness    HPI: [name] is a 51 y.o. female with medical history significant of necrotizing fascitis of sacrum in Nov, DM2, chronic back pain.  Patient presents to the ED with c/o dizziness and lightheadedness "for a while".  She went to her doctor today due to symptoms and BP was 67R systolic.  She was of course then promptly sent to the ED.   ED Course:  BP 69/46.  Improved to 110s after 2L bolus per sepsis protocol.  WBC 17k, initially tachycardic to 100s.  Initial lactate 2.x improves to 1.x post IVF.  Empiric cefepime and vanc.  Review of Systems: As per HPI otherwise 10 point review of systems negative.    Past Medical History:  Diagnosis Date  . Arthritis   . Asthma   . Chronic back pain   . Depression   . Diabetes mellitus   . Hyperlipidemia   . Joint pain   . Leg pain    With Walking  . PAD (peripheral artery disease) ([place])   . Peripheral arterial disease ([place])   . Reflux   . Ulcer ([place])    Foot    Past Surgical History:  Procedure Laterality Date  . ABDOMINAL AORTAGRAM  June 15, 2014  . ABDOMINAL AORTAGRAM N/A 06/15/2014   Procedure: ABDOMINAL Maxcine Ham;  Surgeon: Serafina Mitchell, MD;  Location: [facility] CATH LAB;  Service: Cardiovascular;  Laterality: N/A;  . ABDOMINAL AORTAGRAM N/A 11/22/2014   Procedure: ABDOMINAL AORTAGRAM;  Surgeon: Serafina Mitchell, MD;  Location: [facility] CATH LAB;  Service: Cardiovascular;  Laterality: N/A;  . ARTERIAL BYPASS SURGRY  07/05/2010   Right Common Femoral to below knee popliteal BPG  . BACK SURGERY     X's  2  . CARDIAC CATHETERIZATION    . CHOLECYSTECTOMY     Gall Bladder  . CYSTECTOMY Right    foot  . CYSTECTOMY Left    wrist  . INTERCOSTAL NERVE BLOCK  November 2015  . IRRIGATION AND DEBRIDEMENT BUTTOCKS Right 09/30/2016   Procedure:  DEBRIDEMENT RIGHT  BUTTOCK WOUND;  Surgeon: Georganna Skeans, MD;  Location: [place];  Service: General;  Laterality: Right;  . left foot surgery    . left wrist cyst removal Left   . PERIPHERAL VASCULAR CATHETERIZATION N/A 05/07/2016   Procedure: Abdominal Aortogram;  Surgeon: Serafina Mitchell, MD;  Location: [place] CV LAB;  Service: Cardiovascular;  Laterality: N/A;  . PERIPHERAL VASCULAR CATHETERIZATION N/A 05/07/2016   Procedure: Lower Extremity Angiography;  Surgeon: Serafina Mitchell, MD;  Location: [place] CV LAB;  Service: Cardiovascular;  Laterality: N/A;  . PERIPHERAL VASCULAR CATHETERIZATION N/A 05/07/2016   Procedure: Aortic Arch Angiography;  Surgeon: Serafina Mitchell, MD;  Location: [place] CV LAB;  Service: Cardiovascular;  Laterality: N/A;  . PERIPHERAL VASCULAR CATHETERIZATION N/A 05/07/2016   Procedure: Upper Extremity Angiography;  Surgeon: Serafina Mitchell, MD;  Location: [place] CV LAB;  Service: Cardiovascular;  Laterality: N/A;  . PERIPHERAL VASCULAR CATHETERIZATION Right 05/07/2016   Procedure: Peripheral Vascular Balloon Angioplasty;  Surgeon: Serafina Mitchell, MD;  Location: [place] CV LAB;  Service: Cardiovascular;  Laterality: Right;  subclavian  . PERIPHERAL VASCULAR CATHETERIZATION Right 05/07/2016   Procedure: Peripheral Vascular Intervention;  Surgeon: Serafina Mitchell, MD;  Location: [place] CV  LAB;  Service: Cardiovascular;  Laterality: Right;  External  Iliac  . SKIN GRAFT Right 2012   [name] by Dr. Nils Pyle- Right and Left Ankle  . SPINE SURGERY    . TONSILLECTOMY       reports that she has been smoking Cigarettes.  She has a 45.00 pack-year smoking history. She has never used smokeless tobacco. She reports that she does not drink alcohol or use drugs.  Allergies  Allergen Reactions  . Penicillins Other (See Comments)    Severe Headache    Family History  Problem Relation Age of Onset  . Coronary artery disease Mother   . Peripheral vascular  disease Mother   . Heart disease Mother     Before age 10  . Other Mother     Venous insuffiency  . Diabetes Mother   . Hyperlipidemia Mother   . Hypertension Mother   . Varicose Veins Mother   . Heart attack Mother     before age 32  . Heart disease Father   . Diabetes Father   . Diabetes Maternal Grandmother   . Diabetes Paternal Grandmother   . Diabetes Paternal Grandfather   . Diabetes Sister   . Hypertension Sister   . Diabetes Brother   . Hypertension Brother       Prior to Admission medications   Medication Sig Start Date End Date Taking? Authorizing Provider  albuterol (PROVENTIL HFA;VENTOLIN HFA) 108 (90 Base) MCG/ACT inhaler Inhale 2 puffs into the lungs as needed for wheezing or shortness of breath. Reported on 02/28/2016    Historical Provider, MD  albuterol (PROVENTIL) 2 MG tablet Take 2 mg by mouth 3 (three) times daily as needed for shortness of breath.     Historical Provider, MD  aspirin 81 MG tablet Take 81 mg by mouth daily as needed for pain.     Historical Provider, MD  cetirizine (ZYRTEC) 10 MG tablet Take 10 mg by mouth daily as needed for allergies. Reported on 03/27/2016    Historical Provider, MD  Cholecalciferol 1000 units tablet Take 1,000 Units by mouth daily.    Historical Provider, MD  Choline Fenofibrate (FENOFIBRIC ACID) 45 MG CPDR Take 45 mg by mouth daily. Reported on 03/18/2016 10/30/15   Historical Provider, MD  cyclobenzaprine (FLEXERIL) 10 MG tablet Limit 1 tablet by mouth 1  to  3 times per day if tolerated Patient taking differently: Take 10 mg by mouth 3 (three) times daily as needed for muscle spasms.  07/18/16   Mohammed Kindle, MD  dicyclomine (BENTYL) 10 MG capsule Take 10 mg by mouth 4 (four) times daily as needed for spasms. 08/08/16 08/08/17  Historical Provider, MD  furosemide (LASIX) 40 MG tablet Take 40 mg by mouth 2 (two) times daily as needed for fluid.     Historical Provider, MD  HUMALOG KWIKPEN 100 UNIT/ML KiwkPen Inject 10 Units into  the skin 3 (three) times daily. Reported on 04/08/2016 11/11/15   Historical Provider, MD  HYDROcodone-acetaminophen [facility]) 10-325 MG tablet Limit 1 tablet by mouth per day or twice per day if tolerated 07/18/16   Mohammed Kindle, MD  LANTUS SOLOSTAR 100 UNIT/ML Solostar Pen Inject 60 Units into the skin at bedtime.  09/15/14   Historical Provider, MD  loperamide (IMODIUM A-D) 2 MG tablet Take 2 mg by mouth as needed for diarrhea or loose stools.    Historical Provider, MD  meclizine (ANTIVERT) 25 MG tablet Take 25 mg by mouth 3 (three) times daily as needed for  dizziness.    Historical Provider, MD  naproxen sodium (ANAPROX) 220 MG tablet Take 220 mg by mouth 2 (two) times daily as needed (pain). Reported on 03/18/2016    Historical Provider, MD  omeprazole (PRILOSEC) 20 MG capsule Take 20 mg by mouth daily as needed (heartburn).     Historical Provider, MD  pregabalin (LYRICA) 100 MG capsule Limit 1 tab by mouth twice a day to 3 times a day if tolerated Patient taking differently: Take 100 mg by mouth 2 (two) times daily.  07/18/16   Mohammed Kindle, MD  pseudoephedrine-guaifenesin [name] D) 60-600 MG per tablet Take 1 tablet by mouth every 12 (twelve) hours as needed for congestion. Reported on 04/08/2016    Historical Provider, MD    Physical Exam: Vitals:   11/26/16 2245 11/26/16 2300 11/26/16 2320 11/26/16 2327  BP: 94/70 103/59 100/68   Pulse: 94 68 94 85  Resp: 14 13 15 24   Temp:      TempSrc:      SpO2: 92% (!) 88% 91% 98%  Weight:      Height:          Constitutional: NAD, calm, comfortable Eyes: PERRL, lids and conjunctivae normal ENMT: Mucous membranes are moist. Posterior pharynx clear of any exudate or lesions.Normal dentition.  Neck: normal, supple, no masses, no thyromegaly Respiratory: clear to auscultation bilaterally, no wheezing, no crackles. Normal respiratory effort. No accessory muscle use.  Cardiovascular: Regular rate and rhythm, no murmurs / rubs / gallops. No  extremity edema. 2+ pedal pulses. No carotid bruits.  Abdomen: no tenderness, no masses palpated. No hepatosplenomegaly. Bowel sounds positive.  Musculoskeletal: no clubbing / cyanosis. No joint deformity upper and lower extremities. Good ROM, no contractures. Normal muscle tone.  Skin: Wound has areas of healing and granulation, but also has areas that dont appear to be granulated / healing at this time.  No obvious erythema.  No pain out of proportion. Neurologic: CN 2-12 grossly intact. Sensation intact, DTR normal. Strength 5/5 in all 4.  Psychiatric: Normal judgment and insight. Alert and oriented x 3. Normal mood.    Labs on Admission: I have personally reviewed following labs and imaging studies  CBC:  Recent Labs Lab 11/26/16 2114  WBC 17.7*  NEUTROABS 12.3*  HGB 11.8*  HCT 35.7*  MCV 82.1  PLT 735   Basic Metabolic Panel:  Recent Labs Lab 11/26/16 2114  NA 134*  K 3.1*  CL 95*  CO2 27  GLUCOSE 220*  BUN 13  CREATININE 0.75  CALCIUM 8.9   GFR: Estimated Creatinine Clearance: 80 mL/min (by C-G formula based on SCr of 0.75 mg/dL). Liver Function Tests:  Recent Labs Lab 11/26/16 2114  AST 10*  ALT 7*  ALKPHOS 84  BILITOT 0.2*  PROT 6.8  ALBUMIN 2.7*   No results for input(s): LIPASE, AMYLASE in the last 168 hours. No results for input(s): AMMONIA in the last 168 hours. Coagulation Profile:  Recent Labs Lab 11/26/16 2114  INR 1.06   Cardiac Enzymes: No results for input(s): CKTOTAL, CKMB, CKMBINDEX, TROPONINI in the last 168 hours. BNP (last 3 results) No results for input(s): PROBNP in the last 8760 hours. HbA1C: No results for input(s): HGBA1C in the last 72 hours. CBG: No results for input(s): GLUCAP in the last 168 hours. Lipid Profile: No results for input(s): CHOL, HDL, LDLCALC, TRIG, CHOLHDL, LDLDIRECT in the last 72 hours. Thyroid Function Tests: No results for input(s): TSH, T4TOTAL, FREET4, T3FREE, THYROIDAB in the last 72  hours. Anemia Panel: No results for input(s): VITAMINB12, FOLATE, FERRITIN, TIBC, IRON, RETICCTPCT in the last 72 hours. Urine analysis:    Component Value Date/Time   COLORURINE YELLOW 09/30/2016 1547   APPEARANCEUR CLOUDY (A) 09/30/2016 1547   LABSPEC 1.027 09/30/2016 1547   PHURINE 5.0 09/30/2016 1547   GLUCOSEU >1000 (A) 09/30/2016 1547   HGBUR MODERATE (A) 09/30/2016 1547   BILIRUBINUR NEGATIVE 09/30/2016 1547   KETONESUR NEGATIVE 09/30/2016 1547   PROTEINUR NEGATIVE 09/30/2016 1547   UROBILINOGEN 0.2 06/25/2011 1707   NITRITE NEGATIVE 09/30/2016 1547   LEUKOCYTESUR NEGATIVE 09/30/2016 1547   Sepsis Labs: @LABRCNTIP (procalcitonin:4,lacticidven:4) )No results found for this or any previous visit (from the past 240 hour(s)).   Radiological Exams on Admission: Dg Chest 2 View  Result Date: 11/26/2016 CLINICAL DATA:  Low blood pressure wound on buttock EXAM: CHEST  2 VIEW COMPARISON:  10/01/2016 FINDINGS: The heart size and mediastinal contours are within normal limits. Both lungs are clear. The visualized skeletal structures are unremarkable. Surgical clips in the right upper quadrant IMPRESSION: No active cardiopulmonary disease. Electronically Signed   By: Donavan Foil M.D.   On: 11/26/2016 21:37    EKG: Independently reviewed.  Assessment/Plan Principal Problem:   Sepsis associated hypotension (HCC) Active Problems:   Wound infection   Sacral decubitus ulcer   DM2 (diabetes mellitus, type 2) (HCC)   Chronic back pain    1. Sepsis associated hypotension - 1. Hypotension Improved post IVF 2. Sepsis pathway 3. Cefepime and vanc 4. Suspected source is the decubitus ulcer; however, doesn't appear to be nec-fash at this time. 5. Repeat BMP in AM to monitor kidney function 6. Repeat CBC in AM to monitor leukocytosis 2. Sacral decubitus -  1. Wound care consult 3. Chronic back pain - continue home meds for now 4. DM2 - 1. Reduce lantus to 20 BID (currently on 32 BID  at home) 2. Reduce mealtime to 4 AC (currently 10 AC at home) 3. Add mod sliding scale SSI AC 4. CBG checks AC/HS   DVT prophylaxis: Lovenox Code Status: Full Family Communication: Husband at bedside Consults called: None Admission status: Admit to inpatient   Etta Quill DO [name] Pager [phone] from 7PM-7AM  If 7AM-7PM, please contact the day physician for the patient [email or web address] Password [email or web address]  11/27/2016, 12:29 AM

## 2016-11-27 NOTE — Progress Notes (Addendum)
C diff that was ordered for loose stools last night has come back positive on PCR!  Will go ahead and start vanc PO.  Contact precautions / UV disinfection also ordered.  Did give 1 dose of imodium in ED (before we knew it was C.Diff) last evening, but have verified that no further imodium has been ordered.

## 2016-11-27 NOTE — ED Notes (Signed)
PT at bedside to complete wound care.

## 2016-11-28 ENCOUNTER — Encounter (HOSPITAL_COMMUNITY): Payer: Self-pay | Admitting: General Practice

## 2016-11-28 DIAGNOSIS — E11628 Type 2 diabetes mellitus with other skin complications: Secondary | ICD-10-CM

## 2016-11-28 DIAGNOSIS — L089 Local infection of the skin and subcutaneous tissue, unspecified: Secondary | ICD-10-CM

## 2016-11-28 DIAGNOSIS — L89159 Pressure ulcer of sacral region, unspecified stage: Secondary | ICD-10-CM | POA: Diagnosis not present

## 2016-11-28 DIAGNOSIS — Z794 Long term (current) use of insulin: Secondary | ICD-10-CM

## 2016-11-28 DIAGNOSIS — T148XXA Other injury of unspecified body region, initial encounter: Secondary | ICD-10-CM | POA: Diagnosis not present

## 2016-11-28 DIAGNOSIS — I951 Orthostatic hypotension: Secondary | ICD-10-CM | POA: Diagnosis not present

## 2016-11-28 LAB — GLUCOSE, CAPILLARY
Glucose-Capillary: 140 mg/dL — ABNORMAL HIGH (ref 65–99)
Glucose-Capillary: 234 mg/dL — ABNORMAL HIGH (ref 65–99)

## 2016-11-28 LAB — BASIC METABOLIC PANEL
Anion gap: 11 (ref 5–15)
Anion gap: 13 (ref 5–15)
BUN: 8 mg/dL (ref 6–20)
BUN: 8 mg/dL (ref 6–20)
CHLORIDE: 104 mmol/L (ref 101–111)
CO2: 23 mmol/L (ref 22–32)
CO2: 21 mmol/L — AB (ref 22–32)
CREATININE: 0.45 mg/dL (ref 0.44–1.00)
Calcium: 8.4 mg/dL — ABNORMAL LOW (ref 8.9–10.3)
Calcium: 8.7 mg/dL — ABNORMAL LOW (ref 8.9–10.3)
Chloride: 104 mmol/L (ref 101–111)
Creatinine, Ser: 0.43 mg/dL — ABNORMAL LOW (ref 0.44–1.00)
GFR calc Af Amer: >60 mL/min (ref >60)
GFR calc Af Amer: >60 mL/min (ref >60)
GFR calc non Af Amer: >60 mL/min (ref >60)
GLUCOSE: 113 mg/dL — AB (ref 65–99)
GLUCOSE: 140 mg/dL — AB (ref 65–99)
POTASSIUM: 5.3 mmol/L — AB (ref 3.5–5.1)
Potassium: 4.8 mmol/L (ref 3.5–5.1)
SODIUM: 138 mmol/L (ref 135–145)
Sodium: 138 mmol/L (ref 135–145)

## 2016-11-28 LAB — CBC
HEMATOCRIT: 38.4 % (ref 36.0–46.0)
Hemoglobin: 12.6 g/dL (ref 12.0–15.0)
MCH: 27 pg (ref 26.0–34.0)
MCHC: 32.8 g/dL (ref 30.0–36.0)
MCV: 82.2 fL (ref 78.0–100.0)
Platelets: 322 10*3/uL (ref 150–400)
RBC: 4.67 MIL/uL (ref 3.87–5.11)
RDW: 14.6 % (ref 11.5–15.5)
WBC: 10.5 10*3/uL (ref 4.0–10.5)

## 2016-11-28 MED ORDER — GLUCERNA SHAKE PO LIQD: 237.0000 mL | Freq: Three times a day (TID) | ORAL | Status: DC

## 2016-11-28 MED ORDER — HYDROMORPHONE HCL 2 MG/ML IJ SOLN
1.0000 mg | INTRAMUSCULAR | Status: DC | PRN
  Administered 2016-11-28: 2 mg via INTRAVENOUS
  Filled 2016-11-28: qty 1

## 2016-11-28 MED ORDER — TAB-A-VITE/IRON PO TABS
1.0000 | ORAL_TABLET | Freq: Every day | ORAL | Status: DC
  Filled 2016-11-28: qty 1

## 2016-11-28 NOTE — Care Management Obs Status (Signed)
[place] NOTIFICATION   Patient Details  Name: [name] MRN: [id number] Date of Birth: [birth year]   Medicare Observation Status Notification Given:  Yes    Sharin Mons, RN 11/28/2016, 2:40 PM

## 2016-11-28 NOTE — Discharge Instructions (Signed)

## 2016-11-28 NOTE — Discharge Summary (Signed)
Physician Discharge Summary  [name] YQI:[id number] DOB: Sep 22, 1966 DOA: 11/26/2016  PCP: [name], MD  Admit date: 11/26/2016 Discharge date: 11/28/2016  Admitted From: home   Disposition:  home   Recommendations for Outpatient Follow-up:  1. Aggressive wound care  Home Health:  RN  Equipment/Devices:  none    Discharge Condition:  stable   CODE STATUS:  Full code   Diet recommendation:   diabetic Consultations:      Discharge Diagnoses:  Principal Problem:   Orthostatic hypotension Active Problems:   Wound infection   Sacral decubitus ulcer   DM2 (diabetes mellitus, type 2) (HCC)   Chronic back pain    Subjective: No complaints today. No dizziness or diarrhea.   Brief Summary: 51 y/o with medical history significant for sacral ulcer due to necrotizing fascitis in Nov 2017, DM2, who presents with orthostatic hypotension, complaints of dizziness and hypotension. See HPI for further details of admission.   Hospital Course:  Dizziness - due to orthostatic hypotension - she admits to poor oral intake for 1 wk due to a sinus infection and, in addition to this, used her PRN Lasix 2 x for ankle edema the past few days prior to admission .  - resolved with IVF- ambulating without symptoms- maintaining fluid status with adequate hydration now - understands that she needs to use her Lasix sparing- advised her to wear TEDs which she states she has at home  Leukocytosis on admission- chronic sacral wound - there was a concern for infection in the sacral wound (history on nec fascitis) - started on IV antibiotics on admission due to hypotension and leukocytosis - upon my exam of wound, she appears to have some necrotic tissue but no visible signs of infection - debridement of wound done by gen surgery - has clean granulation tissue underneath - again no pus or infection seen- will d/c Iv antibiotics today - she has an appt at the wound care clinic in a few days- [facility]  also checking on her wound at home  C diff + - no diarrhea prior to coming to the hospital- she had diarrhea in the ER and this resolved without treatment - stool studies reveal she is antigen +, toxin negative - as diarrhea resolved without treatment, will not treat - have advised her to watch for recurrent diarrhea esp as she recently had a course of antibiotics for sinusitis  DM2 -cont Lantus and SSI  Discharge Instructions  Discharge Instructions    Diet - low sodium heart healthy    Complete by:  As directed    Diet Carb Modified    Complete by:  As directed    Increase activity slowly    Complete by:  As directed      Allergies as of 11/28/2016      Reactions   Penicillins Other (See Comments)   Severe Headache      Medication List    STOP taking these medications   loperamide 2 MG tablet Commonly known as:  IMODIUM A-D     TAKE these medications   albuterol 2 MG tablet Commonly known as:  PROVENTIL Take 2 mg by mouth 3 (three) times daily as needed for shortness of breath.   aspirin 81 MG tablet Take 81 mg by mouth daily as needed for pain.   cetirizine 10 MG tablet Commonly known as:  ZYRTEC Take 10 mg by mouth daily as needed for allergies. Reported on 03/27/2016   Cholecalciferol 1000 units tablet Take  1,000 Units by mouth daily.   cyclobenzaprine 10 MG tablet Commonly known as:  FLEXERIL Limit 1 tablet by mouth 1  to  3 times per day if tolerated What changed:  how much to take  how to take this  when to take this  reasons to take this  additional instructions   dicyclomine 10 MG capsule Commonly known as:  BENTYL Take 10 mg by mouth 4 (four) times daily as needed for spasms.   Fenofibric Acid 45 MG Cpdr Take 45 mg by mouth daily. Reported on 03/18/2016   furosemide 40 MG tablet Commonly known as:  LASIX Take 40 mg by mouth 2 (two) times daily as needed for fluid.   HUMALOG KWIKPEN 100 UNIT/ML KiwkPen Generic drug:  insulin  lispro Inject 0-10 Units into the skin 3 (three) times daily. Reported on 04/08/2016   HYDROcodone-acetaminophen 10-325 MG tablet Commonly known as:  NORCO Limit 1 tablet by mouth per day or twice per day if tolerated What changed:  how much to take  how to take this  when to take this  additional instructions   LANTUS SOLOSTAR 100 UNIT/ML Solostar Pen Generic drug:  Insulin Glargine Inject 36 Units into the skin 2 (two) times daily.   meclizine 25 MG tablet Commonly known as:  ANTIVERT Take 25 mg by mouth 3 (three) times daily as needed for dizziness.   naproxen sodium 220 MG tablet Commonly known as:  ANAPROX Take 220 mg by mouth 2 (two) times daily as needed (pain). Reported on 03/18/2016   omeprazole 20 MG capsule Commonly known as:  PRILOSEC Take 20 mg by mouth daily as needed (heartburn).   pregabalin 100 MG capsule Commonly known as:  LYRICA Limit 1 tab by mouth twice a day to 3 times a day if tolerated What changed:  how much to take  how to take this  when to take this  additional instructions   pseudoephedrine-guaifenesin 60-600 MG 12 hr tablet Commonly known as:  MUCINEX D Take 1 tablet by mouth every 12 (twelve) hours as needed for congestion. Reported on 04/08/2016      Follow-up Information    [name], MD Follow up in 1 week(s).   Specialty:  Family Medicine Contact information: [place] [address] [phone]          Allergies  Allergen Reactions  . Penicillins Other (See Comments)    Severe Headache     Procedures/Studies:   Dg Chest 2 View  Result Date: 11/26/2016 CLINICAL DATA:  Low blood pressure wound on buttock EXAM: CHEST  2 VIEW COMPARISON:  10/01/2016 FINDINGS: The heart size and mediastinal contours are within normal limits. Both lungs are clear. The visualized skeletal structures are unremarkable. Surgical clips in the right upper quadrant IMPRESSION: No active cardiopulmonary disease.  Electronically Signed   By: Donavan Foil M.D.   On: 11/26/2016 21:37       Discharge Exam: Vitals:   11/27/16 2040 11/28/16 0528  BP: 121/64 109/64  Pulse: 88 87  Resp: 18 18  Temp: 98 F (36.7 C) 97.9 F (36.6 C)   Vitals:   11/27/16 1833 11/27/16 1854 11/27/16 2040 11/28/16 0528  BP: 117/67 122/65 121/64 109/64  Pulse: 71 87 88 87  Resp: 19  18 18   Temp: 98.2 F (36.8 C) 98.1 F (36.7 C) 98 F (36.7 C) 97.9 F (36.6 C)  TempSrc: Oral Oral Oral Oral  SpO2: 92% 93% 97% 91%  Weight: 65.5 kg (  144 lb 8 oz)     Height: 5\' 4"  (1.626 m)       General: Pt is alert, awake, not in acute distress Cardiovascular: RRR, S1/S2 +, no rubs, no gallops Respiratory: CTA bilaterally, no wheezing, no rhonchi Abdominal: Soft, NT, ND, bowel sounds + Extremities: no edema, no cyanosis    The results of significant diagnostics from this hospitalization (including imaging, microbiology, ancillary and laboratory) are listed below for reference.     Microbiology: Recent Results (from the past 240 hour(s))  Culture, blood (Routine x 2)     Status: None (Preliminary result)   Collection Time: 11/26/16  9:14 PM  Result Value Ref Range Status   Specimen Description BLOOD RIGHT ARM  Final   Special Requests BOTTLES DRAWN AEROBIC AND ANAEROBIC 6CC  Final   Culture NO GROWTH < 12 HOURS  Final   Report Status PENDING  Incomplete  Culture, blood (Routine x 2)     Status: None (Preliminary result)   Collection Time: 11/26/16  9:20 PM  Result Value Ref Range Status   Specimen Description BLOOD RIGHT HAND  Final   Special Requests IN PEDIATRIC BOTTLE 3CC  Final   Culture NO GROWTH < 12 HOURS  Final   Report Status PENDING  Incomplete  C difficile quick scan w PCR reflex     Status: Abnormal   Collection Time: 11/27/16  6:05 AM  Result Value Ref Range Status   C Diff antigen POSITIVE (A) NEGATIVE Final   C Diff toxin NEGATIVE NEGATIVE Final   C Diff interpretation Results are indeterminate.  See PCR results.  Final  Clostridium Difficile by PCR     Status: Abnormal   Collection Time: 11/27/16  6:05 AM  Result Value Ref Range Status   Toxigenic C Difficile by pcr POSITIVE (A) NEGATIVE Final    Comment: Positive for toxigenic C. difficile with little to no toxin production. Only treat if clinical presentation suggests symptomatic illness.     Labs: BNP (last 3 results) No results for input(s): BNP in the last 8760 hours. Basic Metabolic Panel:  Recent Labs Lab 11/26/16 2114 11/27/16 0647 11/27/16 1830 11/28/16 0645 11/28/16 1159  NA 134* 137 139 138 138  K 3.1* 3.3* 4.2 5.3* 4.8  CL 95* 100* 100* 104 104  CO2 27 28 30 23  21*  GLUCOSE 220* 204* 152* 113* 140*  BUN 13 10 8 8 8   CREATININE 0.75 0.58 0.43* 0.43* 0.45  CALCIUM 8.9 8.2* 8.5* 8.4* 8.7*   Liver Function Tests:  Recent Labs Lab 11/26/16 2114  AST 10*  ALT 7*  ALKPHOS 84  BILITOT 0.2*  PROT 6.8  ALBUMIN 2.7*   No results for input(s): LIPASE, AMYLASE in the last 168 hours. No results for input(s): AMMONIA in the last 168 hours. CBC:  Recent Labs Lab 11/26/16 2114 11/27/16 0647 11/28/16 0645  WBC 17.7* 11.5* 10.5  NEUTROABS 12.3*  --   --   HGB 11.8* 12.2 12.6  HCT 35.7* 37.4 38.4  MCV 82.1 81.3 82.2  PLT 360 309 322   Cardiac Enzymes: No results for input(s): CKTOTAL, CKMB, CKMBINDEX, TROPONINI in the last 168 hours. BNP: Invalid input(s): POCBNP CBG:  Recent Labs Lab 11/27/16 1212 11/27/16 1656 11/27/16 2042 11/28/16 0802 11/28/16 1205  GLUCAP 84 156* 114* 140* 234*   D-Dimer No results for input(s): DDIMER in the last 72 hours. Hgb A1c No results for input(s): HGBA1C in the last 72 hours. Lipid Profile No results for input(s):  CHOL, HDL, LDLCALC, TRIG, CHOLHDL, LDLDIRECT in the last 72 hours. Thyroid function studies No results for input(s): TSH, T4TOTAL, T3FREE, THYROIDAB in the last 72 hours.  Invalid input(s): FREET3 Anemia work up No results for input(s):  VITAMINB12, FOLATE, FERRITIN, TIBC, IRON, RETICCTPCT in the last 72 hours. Urinalysis    Component Value Date/Time   COLORURINE YELLOW 09/30/2016 1547   APPEARANCEUR CLOUDY (A) 09/30/2016 1547   LABSPEC 1.027 09/30/2016 1547   PHURINE 5.0 09/30/2016 1547   GLUCOSEU >1000 (A) 09/30/2016 1547   HGBUR MODERATE (A) 09/30/2016 1547   BILIRUBINUR NEGATIVE 09/30/2016 1547   KETONESUR NEGATIVE 09/30/2016 1547   PROTEINUR NEGATIVE 09/30/2016 1547   UROBILINOGEN 0.2 06/25/2011 1707   NITRITE NEGATIVE 09/30/2016 1547   LEUKOCYTESUR NEGATIVE 09/30/2016 1547   Sepsis Labs Invalid input(s): PROCALCITONIN,  WBC,  LACTICIDVEN Microbiology Recent Results (from the past 240 hour(s))  Culture, blood (Routine x 2)     Status: None (Preliminary result)   Collection Time: 11/26/16  9:14 PM  Result Value Ref Range Status   Specimen Description BLOOD RIGHT ARM  Final   Special Requests BOTTLES DRAWN AEROBIC AND ANAEROBIC 6CC  Final   Culture NO GROWTH < 12 HOURS  Final   Report Status PENDING  Incomplete  Culture, blood (Routine x 2)     Status: None (Preliminary result)   Collection Time: 11/26/16  9:20 PM  Result Value Ref Range Status   Specimen Description BLOOD RIGHT HAND  Final   Special Requests IN PEDIATRIC BOTTLE 3CC  Final   Culture NO GROWTH < 12 HOURS  Final   Report Status PENDING  Incomplete  C difficile quick scan w PCR reflex     Status: Abnormal   Collection Time: 11/27/16  6:05 AM  Result Value Ref Range Status   C Diff antigen POSITIVE (A) NEGATIVE Final   C Diff toxin NEGATIVE NEGATIVE Final   C Diff interpretation Results are indeterminate. See PCR results.  Final  Clostridium Difficile by PCR     Status: Abnormal   Collection Time: 11/27/16  6:05 AM  Result Value Ref Range Status   Toxigenic C Difficile by pcr POSITIVE (A) NEGATIVE Final    Comment: Positive for toxigenic C. difficile with little to no toxin production. Only treat if clinical presentation suggests  symptomatic illness.     Time coordinating discharge: Over 30 minutes  SIGNED:   Debbe Odea, MD  [name] 11/28/2016, 1:45 PM Pager   If 7PM-7AM, please contact night-coverage [email or web address] Password [email or web address]

## 2016-11-28 NOTE — Progress Notes (Signed)
Initial Nutrition Assessment  DOCUMENTATION CODES:   Non-severe (moderate) malnutrition in context of chronic illness  INTERVENTION:   -Continue Glucerna Shake po TID, each supplement provides 220 kcal and 10 grams of protein -MVI daily  NUTRITION DIAGNOSIS:   Malnutrition related to chronic illness as evidenced by moderate depletion of body fat, mild depletion of body fat, mild depletion of muscle mass, moderate depletions of muscle mass, percent weight loss.  GOAL:   Patient will meet greater than or equal to 90% of their needs  MONITOR:   PO intake, Supplement acceptance, Labs, Weight trends, Skin, I & O's  REASON FOR ASSESSMENT:   Malnutrition Screening Tool    ASSESSMENT:   [name] is a 51 y.o. female with medical history significant of necrotizing fascitis of sacrum in Nov, DM2, chronic back pain.  Patient presents to the ED with c/o dizziness and lightheadedness "for a while".  She went to her doctor today due to symptoms and BP was 71G systolic.  She was of course then promptly sent to the ED.   Pt admitted with sepsis associated hypotension.   Pt with hx of necrotizing fascitis to the sacrum. Surgery evaluated on 11/27/16. Pt received hydrotherapy on 11/27/16.  Spoke with pt at bedside, who reports poor appetite. She consumes about 1 meal per day PTA, which will consist of food such as fried chicken and mashed potatoes with gravy. Pt states she is noncompliant with DM diet because it is difficult to cook as she cannot stand for long periods of time. She often relies on her husband for meal prep, but he often purchases convenience foods. Pt occassionally snack throughout the day on items such as yogurt, milk, and carnation instant breakfast. She shares she has minimal teet, but decline chewing difficulty or offer to downgrade diet consistency.   Pt reports progressive wt loss over the past several years, after having multiple back surgeries. Pt shares that she used  to weight 270# and weight has progressively decreased. Noted a 11% wt loss over the past 6 months, which is significant for time frame.   Nutrition-Focused physical exam completed. Findings are mild to moderate fat depletion, mild to moderate muscle depletion, and no edema.   Discussed with pt ways to increase protein and calorie intake in her diet by incorporating several small meals daily. Provided examples of high protein, lower carbohydrate snack options that are easy to prepare. Pt also interested in taking a MVI for wound healing. She enjoys Glucerna shakes, which have been ordered.   Labs reviewed: K: 5.3, CBGS: 144-156.   Diet Order:  Diet Carb Modified Fluid consistency: Thin; Room service appropriate? Yes Diet - low sodium heart healthy Diet Carb Modified  Skin:  Wound (see comment) (necrotizing fascitis sacrum)  Last BM:  11/27/16  Height:   Ht Readings from Last 1 Encounters:  11/27/16 5\' 4"  (1.626 m)    Weight:   Wt Readings from Last 1 Encounters:  11/27/16 144 lb 8 oz (65.5 kg)    Ideal Body Weight:  54.5 kg  BMI:  Body mass index is 24.8 kg/m.  Estimated Nutritional Needs:   Kcal:  1900-2100  Protein:  100-115 grams  Fluid:  >1.9 L  EDUCATION NEEDS:   Education needs addressed  [name] A. Jimmye Norman, RD, LDN, CDE Pager: [phone] After hours Pager: [phone]

## 2016-11-28 NOTE — Progress Notes (Signed)
Physical Therapy Wound Treatment Patient Details  Name: [name] MRN: [id number] Date of Birth: Dec 19, 1965  Today's Date: 11/28/2016 Time: 0830-0922 Time Calculation (min): 52 min  Subjective  Subjective: Pt reporting she wishes we weren't here to do her hydrotherapy but is agreeable to proceed with treatment.  Patient and Family Stated Goals: Heal wound Date of Onset:  (Hx of necrotizing fasciitis ) Prior Treatments: Bedside debridement this session.   Pain Score: Pt was premedicated however pain was still limiting debridement.   Wound Assessment  Wound / Incision (Open or Dehisced) 11/27/16 Non-pressure wound Buttocks Right (Active)  Dressing Type ABD;Barrier Film (skin prep);Gauze (Comment);Moist to dry 11/28/2016  1:52 PM  Dressing Changed Changed 11/28/2016  1:52 PM  Dressing Status Clean;Dry;Intact 11/28/2016  1:52 PM  Dressing Change Frequency Daily 11/28/2016  1:52 PM  Site / Wound Assessment Red;Yellow 11/28/2016  1:52 PM  % Wound base Red or Granulating 75% 11/28/2016  1:52 PM  % Wound base Yellow/Fibrinous Exudate 25% 11/28/2016  1:52 PM  % Wound base Black/Eschar 0% 11/28/2016  1:52 PM  Peri-wound Assessment Intact 11/28/2016  1:52 PM  Wound Length (cm) 11 cm 11/27/2016  2:51 PM  Wound Width (cm) 8 cm 11/27/2016  2:51 PM  Wound Depth (cm) 0.1 cm 11/27/2016  2:51 PM  Margins Epibole (rolled edges) 11/28/2016  1:52 PM  Closure None 11/28/2016  1:52 PM  Drainage Amount Moderate 11/28/2016  1:52 PM  Drainage Description Serosanguineous 11/28/2016  1:52 PM  Non-staged Wound Description Full thickness 11/28/2016  1:52 PM  Treatment Debridement (Selective);Hydrotherapy (Pulse lavage);Packing (Saline gauze) 11/28/2016  1:52 PM   Hydrotherapy Pulsed lavage therapy - wound location: R buttock Pulsed Lavage with Suction (psi): 4 psi (for pain control) Pulsed Lavage with Suction - Normal Saline Used: 1000 mL Pulsed Lavage Tip: Tip with splash shield Selective Debridement Selective  Debridement - Location: R buttock Selective Debridement - Tools Used: Forceps;Scissors Selective Debridement - Tissue Removed: yellow necrotic tissue   Wound Assessment and Plan  Wound Therapy - Assess/Plan/Recommendations Wound Therapy - Clinical Statement: Pain and anxiety are limiting factors, and minimal debridement was possible due to poor tolerance. This patient will benefit from continued hydrotherapy for selective removal of nonviable tissue and to promote wound bed healing.  Wound Therapy - Functional Problem List: Decreased tolerance for OOB Factors Delaying/Impairing Wound Healing: Diabetes Mellitus;Infection - systemic/local Hydrotherapy Plan: Debridement;Dressing change;Patient/family education;Pulsatile lavage with suction Wound Therapy - Frequency: 6X / week Wound Therapy - Follow Up Recommendations: Home health RN Wound Plan: See above  Wound Therapy Goals- Improve the function of patient's integumentary system by progressing the wound(s) through the phases of wound healing (inflammation - proliferation - remodeling) by: Decrease Necrotic Tissue to: 0% Decrease Necrotic Tissue - Progress: Progressing toward goal Increase Granulation Tissue to: 100% Increase Granulation Tissue - Progress: Progressing toward goal Goals/treatment plan/discharge plan were made with and agreed upon by patient/family: Yes Time For Goal Achievement: 7 days Wound Therapy - Potential for Goals: Good  Goals will be updated until maximal potential achieved or discharge criteria met.  Discharge criteria: when goals achieved, discharge from hospital, MD decision/surgical intervention, no progress towards goals, refusal/missing three consecutive treatments without notification or medical reason.  GP     Thelma Comp 11/28/2016, 2:15 PM   Rolinda Roan, PT, DPT Acute Rehabilitation Services Pager: [phone]

## 2016-11-28 NOTE — Care Management CC44 (Signed)
Condition Code 44 Documentation Completed  Patient Details  Name: [name] MRN: [id number] Date of Birth: 07-Aug-1966   Condition Code 44 given:  Yes Patient signature on Condition Code 44 notice:  Yes Documentation of 2 MD's agreement:  Yes Code 44 added to claim:  Yes    Sharin Mons, RN 11/28/2016, 2:40 PM

## 2016-12-01 LAB — CULTURE, BLOOD (ROUTINE X 2)
CULTURE: NO GROWTH
Culture: NO GROWTH

## 2016-12-03 ENCOUNTER — Ambulatory Visit: Admission: RE | Admit: 2016-12-03 | Payer: Medicare Other | Source: Ambulatory Visit | Admitting: Gastroenterology

## 2016-12-03 ENCOUNTER — Encounter: Admission: RE | Payer: Self-pay | Source: Ambulatory Visit

## 2016-12-03 SURGERY — COLONOSCOPY WITH PROPOFOL
Anesthesia: General

## 2016-12-12 ENCOUNTER — Emergency Department (HOSPITAL_COMMUNITY)
Admission: EM | Admit: 2016-12-12 | Discharge: 2016-12-13 | Disposition: A | Discharge status: Home / Self Care | Payer: Medicare Other | Attending: Emergency Medicine | Admitting: Emergency Medicine

## 2016-12-12 ENCOUNTER — Encounter (HOSPITAL_COMMUNITY): Payer: Self-pay

## 2016-12-12 DIAGNOSIS — J45909 Unspecified asthma, uncomplicated: Secondary | ICD-10-CM | POA: Insufficient documentation

## 2016-12-12 DIAGNOSIS — Z794 Long term (current) use of insulin: Secondary | ICD-10-CM | POA: Insufficient documentation

## 2016-12-12 DIAGNOSIS — F17210 Nicotine dependence, cigarettes, uncomplicated: Secondary | ICD-10-CM | POA: Insufficient documentation

## 2016-12-12 DIAGNOSIS — E1140 Type 2 diabetes mellitus with diabetic neuropathy, unspecified: Secondary | ICD-10-CM | POA: Insufficient documentation

## 2016-12-12 DIAGNOSIS — Z7982 Long term (current) use of aspirin: Secondary | ICD-10-CM | POA: Insufficient documentation

## 2016-12-12 DIAGNOSIS — L89159 Pressure ulcer of sacral region, unspecified stage: Secondary | ICD-10-CM | POA: Insufficient documentation

## 2016-12-12 LAB — URINALYSIS, ROUTINE W REFLEX MICROSCOPIC
BILIRUBIN URINE: NEGATIVE
Glucose, UA: >=500 mg/dL — AB
Hgb urine dipstick: NEGATIVE
KETONES UR: NEGATIVE mg/dL
Nitrite: NEGATIVE
Protein, ur: NEGATIVE mg/dL
SPECIFIC GRAVITY, URINE: 1.005 (ref 1.005–1.030)
pH: 5 (ref 5.0–8.0)

## 2016-12-12 LAB — CBC WITH DIFFERENTIAL/PLATELET
Basophils Absolute: 0 10*3/uL (ref 0.0–0.1)
Basophils Relative: 0 %
EOS ABS: 0.2 10*3/uL (ref 0.0–0.7)
EOS PCT: 1 %
HCT: 36 % (ref 36.0–46.0)
Hemoglobin: 11.8 g/dL — ABNORMAL LOW (ref 12.0–15.0)
LYMPHS ABS: 3 10*3/uL (ref 0.7–4.0)
LYMPHS PCT: 19 %
MCH: 26.4 pg (ref 26.0–34.0)
MCHC: 32.8 g/dL (ref 30.0–36.0)
MCV: 80.5 fL (ref 78.0–100.0)
MONO ABS: 0.8 10*3/uL (ref 0.1–1.0)
MONOS PCT: 5 %
Neutro Abs: 12 10*3/uL — ABNORMAL HIGH (ref 1.7–7.7)
Neutrophils Relative %: 75 %
PLATELETS: 391 10*3/uL (ref 150–400)
RBC: 4.47 MIL/uL (ref 3.87–5.11)
RDW: 14.6 % (ref 11.5–15.5)
WBC: 16 10*3/uL — AB (ref 4.0–10.5)

## 2016-12-12 LAB — COMPREHENSIVE METABOLIC PANEL
ALBUMIN: 2.8 g/dL — AB (ref 3.5–5.0)
ALT: 8 U/L — AB (ref 14–54)
AST: 10 U/L — AB (ref 15–41)
Alkaline Phosphatase: 74 U/L (ref 38–126)
Anion gap: 11 (ref 5–15)
BUN: 19 mg/dL (ref 6–20)
CHLORIDE: 95 mmol/L — AB (ref 101–111)
CO2: 26 mmol/L (ref 22–32)
CREATININE: 0.74 mg/dL (ref 0.44–1.00)
Calcium: 9.5 mg/dL (ref 8.9–10.3)
GFR calc Af Amer: >60 mL/min (ref >60)
GLUCOSE: 330 mg/dL — AB (ref 65–99)
POTASSIUM: 4.3 mmol/L (ref 3.5–5.1)
SODIUM: 132 mmol/L — AB (ref 135–145)
Total Bilirubin: 0.4 mg/dL (ref 0.3–1.2)
Total Protein: 6.9 g/dL (ref 6.5–8.1)

## 2016-12-12 LAB — I-STAT CG4 LACTIC ACID, ED
LACTIC ACID, VENOUS: 1.86 mmol/L (ref 0.5–1.9)
Lactic Acid, Venous: 1.11 mmol/L (ref 0.5–1.9)

## 2016-12-12 MED ORDER — SODIUM CHLORIDE 0.9 % IV BOLUS (SEPSIS)
1000.0000 mL | Freq: Once | INTRAVENOUS | Status: AC
  Administered 2016-12-12: 1000 mL via INTRAVENOUS

## 2016-12-12 NOTE — ED Triage Notes (Signed)
Patient seen from [facility] wound clinic for possible surgical debridement of wound to buttocks. Has been seen for same in past, wound present since November. Patient alert and oriented on arrival, also hypotensive at MD this am

## 2016-12-12 NOTE — ED Provider Notes (Signed)
[facility] DEPT Provider Note   CSN: [id number] Arrival date & time: 12/12/16  1349     History   Chief Complaint Chief Complaint  Patient presents with  . wound debridement/hypotension    HPI [name] is a 51 y.o. female.  HPI Pt has a chronic buttock wound.  She was admitted to the hospital back in November and had surgery for it then.  She was readmitted to the hospital and wound treatment again earlier this month.  Pt was released on 1/11.  She has been going to the [facility] wound center.  She went to the wound center today.  Her doctor sent her to the emergency room because he felt she needed surgery again on her wound.   No fevers.  No nausea or vomiting.  She has been nauseated.  Her blood pressure has been running low.  She denies any bleeding. Past Medical History:  Diagnosis Date  . Arthritis   . Asthma   . Chronic back pain   . Depression   . Diabetes mellitus   . Diabetic neuropathy ([place])   . Family history of adverse reaction to anesthesia    mother had difficlty waking   . GERD (gastroesophageal reflux disease)   . Hyperlipidemia   . Joint pain   . Leg pain    With Walking  . PAD (peripheral artery disease) ([place])   . Peripheral arterial disease ([place])   . Reflux   . Ulcer [facility])    Foot    Patient Active Problem List   Diagnosis Date Noted  . Sacral decubitus ulcer 11/27/2016  . DM2 (diabetes mellitus, type 2) ([place]) 11/27/2016  . Chronic back pain 11/27/2016  . Orthostatic hypotension 11/27/2016  . Demand ischemia ([place])   . Other hyperlipidemia   . Right sided weakness   . Retinal tumor of right eye   . Pressure injury of skin 10/01/2016  . Acute respiratory failure ([place])   . Wound infection 09/30/2016  . Necrotizing fasciitis ([place]) 09/30/2016  . Hyperglycemia   . 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 Paresthesia of both hands 03/05/2016  . DDD (degenerative disc disease), lumbosacral 03/26/2015  . DDD (degenerative  disc disease), cervical 03/26/2015  . Sacroiliac joint disease 03/26/2015  . Occipital neuralgia 03/26/2015  . Hip pain 10/07/2014  . Arm heaviness 04/25/2014  . Weakness of both legs-Arms 04/25/2014  . Pain in limb-Bilateral Leg 04/25/2014  . PAD (peripheral artery disease) ([place]) 04/05/2013  . 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 Peripheral vascular disease, unspecified 02/10/2012  . 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 TRIGGER FINGER 02/27/2009    Past Surgical History:  Procedure Laterality Date  . ABDOMINAL AORTAGRAM  June 15, 2014  . ABDOMINAL AORTAGRAM N/A 06/15/2014   Procedure: ABDOMINAL Maxcine Ham;  Surgeon: Serafina Mitchell, MD;  Location: [facility] CATH LAB;  Service: Cardiovascular;  Laterality: N/A;  . ABDOMINAL AORTAGRAM N/A 11/22/2014   Procedure: ABDOMINAL AORTAGRAM;  Surgeon: Serafina Mitchell, MD;  Location: [facility] CATH LAB;  Service: Cardiovascular;  Laterality: N/A;  . ARTERIAL BYPASS SURGRY  07/05/2010   Right Common Femoral to below knee popliteal BPG  . BACK SURGERY     X's  2  . CARDIAC CATHETERIZATION    . CHOLECYSTECTOMY     Gall Bladder  . CYSTECTOMY Right    foot  . CYSTECTOMY Left    wrist  . INTERCOSTAL NERVE BLOCK  November 2015  . IRRIGATION  AND DEBRIDEMENT BUTTOCKS Right 09/30/2016   Procedure: DEBRIDEMENT RIGHT  BUTTOCK WOUND;  Surgeon: Georganna Skeans, MD;  Location: [facility];  Service: General;  Laterality: Right;  . left foot surgery    . left wrist cyst removal Left   . PERIPHERAL VASCULAR CATHETERIZATION N/A 05/07/2016   Procedure: Abdominal Aortogram;  Surgeon: Serafina Mitchell, MD;  Location: [facility] CV LAB;  Service: Cardiovascular;  Laterality: N/A;  . PERIPHERAL VASCULAR CATHETERIZATION N/A 05/07/2016   Procedure: Lower Extremity Angiography;  Surgeon: Serafina Mitchell, MD;  Location: [facility] CV LAB;  Service: Cardiovascular;  Laterality: N/A;  . PERIPHERAL VASCULAR CATHETERIZATION N/A 05/07/2016    Procedure: Aortic Arch Angiography;  Surgeon: Serafina Mitchell, MD;  Location: [place] CV LAB;  Service: Cardiovascular;  Laterality: N/A;  . PERIPHERAL VASCULAR CATHETERIZATION N/A 05/07/2016   Procedure: Upper Extremity Angiography;  Surgeon: Serafina Mitchell, MD;  Location: [place] CV LAB;  Service: Cardiovascular;  Laterality: N/A;  . PERIPHERAL VASCULAR CATHETERIZATION Right 05/07/2016   Procedure: Peripheral Vascular Balloon Angioplasty;  Surgeon: Serafina Mitchell, MD;  Location: [facility] CV LAB;  Service: Cardiovascular;  Laterality: Right;  subclavian  . PERIPHERAL VASCULAR CATHETERIZATION Right 05/07/2016   Procedure: Peripheral Vascular Intervention;  Surgeon: Serafina Mitchell, MD;  Location: [facility] CV LAB;  Service: Cardiovascular;  Laterality: Right;  External  Iliac  . SKIN GRAFT Right 2012   [name] by Dr. Nils Pyle- Right and Left Ankle  . SPINE SURGERY    . TONSILLECTOMY      OB History    No data available       Home Medications    Prior to Admission medications   Medication Sig Start Date End Date Taking? Authorizing Provider  albuterol (PROVENTIL) 2 MG tablet Take 2 mg by mouth 3 (three) times daily as needed for shortness of breath.    Yes Historical Provider, MD  aspirin 81 MG tablet Take 81 mg by mouth daily as needed for pain.    Yes Historical Provider, MD  azithromycin (ZITHROMAX) 250 MG tablet Take 250 mg by mouth daily. 12/07/16  Yes Historical Provider, MD  cetirizine (ZYRTEC) 10 MG tablet Take 10 mg by mouth daily as needed for allergies. Reported on 03/27/2016   Yes Historical Provider, MD  Choline Fenofibrate (FENOFIBRIC ACID) 45 MG CPDR Take 45 mg by mouth daily. Reported on 03/18/2016 10/30/15  Yes Historical Provider, MD  cyclobenzaprine (FLEXERIL) 10 MG tablet Limit 1 tablet by mouth 1  to  3 times per day if tolerated Patient taking differently: Take 10 mg by mouth 3 (three) times daily as needed for muscle spasms.  07/18/16  Yes Mohammed Kindle, MD    dicyclomine (BENTYL) 10 MG capsule Take 10 mg by mouth 4 (four) times daily as needed for spasms. 08/08/16 08/08/17 Yes Historical Provider, MD  furosemide (LASIX) 40 MG tablet Take 40 mg by mouth 2 (two) times daily as needed for fluid.    Yes Historical Provider, MD  HUMALOG KWIKPEN 100 UNIT/ML KiwkPen Inject 0-10 Units into the skin 3 (three) times daily. Reported on 04/08/2016 11/11/15  Yes Historical Provider, MD  HYDROcodone-acetaminophen (NORCO) 10-325 MG tablet Limit 1 tablet by mouth per day or twice per day if tolerated Patient taking differently: Take 1 tablet by mouth 3 (three) times daily.  07/18/16  Yes Mohammed Kindle, MD  LANTUS SOLOSTAR 100 UNIT/ML Solostar Pen Inject 36 Units into the skin 2 (two) times daily.  09/15/14  Yes Historical Provider,  MD  omeprazole (PRILOSEC) 40 MG capsule Take 40 mg by mouth daily. 11/06/16  Yes Historical Provider, MD  pregabalin (LYRICA) 100 MG capsule Limit 1 tab by mouth twice a day to 3 times a day if tolerated Patient taking differently: Take 100 mg by mouth 3 (three) times daily.  07/18/16  Yes Mohammed Kindle, MD  pseudoephedrine-guaifenesin [facility] D) 60-600 MG per tablet Take 1 tablet by mouth every 12 (twelve) hours as needed for congestion. Reported on 04/08/2016   Yes Historical Provider, MD    Family History Family History  Problem Relation Age of Onset  . Coronary artery disease Mother   . Peripheral vascular disease Mother   . Heart disease Mother     Before age 53  . Other Mother     Venous insuffiency  . Diabetes Mother   . Hyperlipidemia Mother   . Hypertension Mother   . Varicose Veins Mother   . Heart attack Mother     before age 59  . Heart disease Father   . Diabetes Father   . Diabetes Maternal Grandmother   . Diabetes Paternal Grandmother   . Diabetes Paternal Grandfather   . Diabetes Sister   . Hypertension Sister   . Diabetes Brother   . Hypertension Brother     Social History Social History  Substance Use  Topics  . Smoking status: Current Every Day Smoker    Packs/day: 1.50    Years: 30.00    Types: Cigarettes  . Smokeless tobacco: Never Used     Comment: 2 cigarrettes a day  . Alcohol use No     Allergies   Penicillins   Review of Systems Review of Systems  All other systems reviewed and are negative.    Physical Exam Updated Vital Signs BP 101/66   Pulse 98   Temp 98.3 F (36.8 C) (Oral)   Resp 20   SpO2 93%   Physical Exam  Constitutional: No distress.  HENT:  Head: Normocephalic and atraumatic.  Right Ear: External ear normal.  Left Ear: External ear normal.  Eyes: Conjunctivae are normal. Right eye exhibits no discharge. Left eye exhibits no discharge. No scleral icterus.  Neck: Neck supple. No tracheal deviation present.  Cardiovascular: Normal rate, regular rhythm and intact distal pulses.   Pulmonary/Chest: Effort normal and breath sounds normal. No stridor. No respiratory distress. She has no wheezes. She has no rales.  Abdominal: Soft. Bowel sounds are normal. She exhibits no distension. There is no tenderness. There is no rebound and no guarding.  Genitourinary:  Genitourinary Comments: Decubitus wound in the right buttock sacral region, pink granulation tissue at the wound base with the exception of one area where there is fibrinous exudate and the ulcer is deeper, no purulent drainage noted  Musculoskeletal: She exhibits no edema or tenderness.  Time size ulceration on the lateral aspect of the foot, no erythema or drainage  Neurological: She is alert. She has normal strength. No cranial nerve deficit (no facial droop, extraocular movements intact, no slurred speech) or sensory deficit. She exhibits normal muscle tone. She displays no seizure activity. Coordination normal.  Skin: Skin is warm and dry. No rash noted.  Psychiatric: She has a normal mood and affect.  Nursing note and vitals reviewed.    ED Treatments / Results  Labs (all labs ordered are  listed, but only abnormal results are displayed) Labs Reviewed  COMPREHENSIVE METABOLIC PANEL - Abnormal; Notable for the following:  Result Value   Sodium 132 (*)    Chloride 95 (*)    Glucose, Bld 330 (*)    Albumin 2.8 (*)    AST 10 (*)    ALT 8 (*)    All other components within normal limits  CBC WITH DIFFERENTIAL/PLATELET - Abnormal; Notable for the following:    WBC 16.0 (*)    Hemoglobin 11.8 (*)    Neutro Abs 12.0 (*)    All other components within normal limits  URINALYSIS, ROUTINE W REFLEX MICROSCOPIC  I-STAT CG4 LACTIC ACID, ED  I-STAT CG4 LACTIC ACID, ED    Procedures Procedures (including critical care time)  Medications Ordered in ED Medications  sodium chloride 0.9 % bolus 1,000 mL (1,000 mLs Intravenous New Bag/Given 12/12/16 1807)     Initial Impression / Assessment and Plan / ED Course  I have reviewed the triage vital signs and the nursing notes.  Pertinent labs & imaging results that were available during my care of the patient were reviewed by me and considered in my medical decision making (see chart for details).  Clinical Course as of Dec 12 1836  Thu Dec 12, 2016  1555 Reviewed triage vitals.  BP in the 70s initially.  Will need to repeat  [JK]  1558 Pt is not in the room currently  [JK]  1624 Hyperglycemia and elevated WBC.  Lactic acid normal.  SBP is >120 at the bedside  [JK]  1837 Dr. Rosendo Gros came and evaluated the patient in the emergency department. He debrided the wound in the ED. He did not feel the patient required hospitalization for any further wound debridement  [JK]    Clinical Course User Index [JK] Dorie Rank, MD   Patient had an episode of hypotension when she first arrived she was given fluids and her blood pressure improved. Patient's white blood cell count is elevated however she does not have any evidence of infection on exam. Her lactic acid level is normal. I doubt sepsis or more serious infection. Reviewing the records  the patient has had difficulties with low blood pressure in the past  Patient's wound was debrided in the emergency room by Dr. Rosendo Gros. I do not feel that she requires hospitalization at this time.   Final Clinical Impressions(s) / ED Diagnoses   Final diagnoses:  Decubitus ulcer of sacral region, unspecified ulcer stage    New Prescriptions New Prescriptions   No medications on file     Dorie Rank, MD 12/12/16 Bosie Helper

## 2016-12-12 NOTE — Discharge Instructions (Signed)
Follow-up with your primary care doctor, continue your current medications, drink plenty of fluids

## 2016-12-23 ENCOUNTER — Encounter (HOSPITAL_COMMUNITY): Payer: Self-pay | Admitting: Emergency Medicine

## 2016-12-23 ENCOUNTER — Other Ambulatory Visit: Payer: Self-pay | Admitting: Pain Medicine

## 2016-12-23 ENCOUNTER — Inpatient Hospital Stay (HOSPITAL_COMMUNITY)
Admission: EM | Admit: 2016-12-23 | Discharge: 2017-01-01 | DRG: 871 | Disposition: A | Discharge status: Home Health | Payer: Medicare Other | Attending: Internal Medicine | Admitting: Internal Medicine

## 2016-12-23 ENCOUNTER — Emergency Department (HOSPITAL_COMMUNITY): Payer: Medicare Other

## 2016-12-23 DIAGNOSIS — L97319 Non-pressure chronic ulcer of right ankle with unspecified severity: Secondary | ICD-10-CM | POA: Diagnosis present

## 2016-12-23 DIAGNOSIS — E46 Unspecified protein-calorie malnutrition: Secondary | ICD-10-CM | POA: Diagnosis present

## 2016-12-23 DIAGNOSIS — Z880 Allergy status to penicillin: Secondary | ICD-10-CM | POA: Diagnosis not present

## 2016-12-23 DIAGNOSIS — E1140 Type 2 diabetes mellitus with diabetic neuropathy, unspecified: Secondary | ICD-10-CM | POA: Diagnosis present

## 2016-12-23 DIAGNOSIS — E1151 Type 2 diabetes mellitus with diabetic peripheral angiopathy without gangrene: Secondary | ICD-10-CM | POA: Diagnosis present

## 2016-12-23 DIAGNOSIS — L03115 Cellulitis of right lower limb: Secondary | ICD-10-CM | POA: Diagnosis present

## 2016-12-23 DIAGNOSIS — Z7982 Long term (current) use of aspirin: Secondary | ICD-10-CM | POA: Diagnosis not present

## 2016-12-23 DIAGNOSIS — H5440 Blindness, one eye, unspecified eye: Secondary | ICD-10-CM | POA: Diagnosis present

## 2016-12-23 DIAGNOSIS — E11649 Type 2 diabetes mellitus with hypoglycemia without coma: Secondary | ICD-10-CM | POA: Diagnosis not present

## 2016-12-23 DIAGNOSIS — B370 Candidal stomatitis: Secondary | ICD-10-CM | POA: Diagnosis present

## 2016-12-23 DIAGNOSIS — A419 Sepsis, unspecified organism: Secondary | ICD-10-CM | POA: Diagnosis present

## 2016-12-23 DIAGNOSIS — E440 Moderate protein-calorie malnutrition: Secondary | ICD-10-CM

## 2016-12-23 DIAGNOSIS — E11621 Type 2 diabetes mellitus with foot ulcer: Secondary | ICD-10-CM | POA: Diagnosis present

## 2016-12-23 DIAGNOSIS — L89159 Pressure ulcer of sacral region, unspecified stage: Secondary | ICD-10-CM | POA: Diagnosis present

## 2016-12-23 DIAGNOSIS — N179 Acute kidney failure, unspecified: Secondary | ICD-10-CM | POA: Diagnosis present

## 2016-12-23 DIAGNOSIS — E785 Hyperlipidemia, unspecified: Secondary | ICD-10-CM | POA: Diagnosis present

## 2016-12-23 DIAGNOSIS — I10 Essential (primary) hypertension: Secondary | ICD-10-CM | POA: Diagnosis present

## 2016-12-23 DIAGNOSIS — Z8249 Family history of ischemic heart disease and other diseases of the circulatory system: Secondary | ICD-10-CM | POA: Diagnosis not present

## 2016-12-23 DIAGNOSIS — B9689 Other specified bacterial agents as the cause of diseases classified elsewhere: Secondary | ICD-10-CM | POA: Diagnosis not present

## 2016-12-23 DIAGNOSIS — M86671 Other chronic osteomyelitis, right ankle and foot: Secondary | ICD-10-CM | POA: Diagnosis present

## 2016-12-23 DIAGNOSIS — Z978 Presence of other specified devices: Secondary | ICD-10-CM | POA: Diagnosis not present

## 2016-12-23 DIAGNOSIS — S31829D Unspecified open wound of left buttock, subsequent encounter: Secondary | ICD-10-CM

## 2016-12-23 DIAGNOSIS — Z6823 Body mass index (BMI) 23.0-23.9, adult: Secondary | ICD-10-CM

## 2016-12-23 DIAGNOSIS — E119 Type 2 diabetes mellitus without complications: Secondary | ICD-10-CM

## 2016-12-23 DIAGNOSIS — E872 Acidosis: Secondary | ICD-10-CM | POA: Diagnosis present

## 2016-12-23 DIAGNOSIS — IMO0001 Reserved for inherently not codable concepts without codable children: Secondary | ICD-10-CM | POA: Diagnosis present

## 2016-12-23 DIAGNOSIS — R0902 Hypoxemia: Secondary | ICD-10-CM | POA: Diagnosis present

## 2016-12-23 DIAGNOSIS — M86471 Chronic osteomyelitis with draining sinus, right ankle and foot: Secondary | ICD-10-CM | POA: Diagnosis not present

## 2016-12-23 DIAGNOSIS — R7989 Other specified abnormal findings of blood chemistry: Secondary | ICD-10-CM

## 2016-12-23 DIAGNOSIS — I9589 Other hypotension: Secondary | ICD-10-CM | POA: Diagnosis present

## 2016-12-23 DIAGNOSIS — G894 Chronic pain syndrome: Secondary | ICD-10-CM

## 2016-12-23 DIAGNOSIS — E11622 Type 2 diabetes mellitus with other skin ulcer: Secondary | ICD-10-CM | POA: Diagnosis not present

## 2016-12-23 DIAGNOSIS — I739 Peripheral vascular disease, unspecified: Secondary | ICD-10-CM

## 2016-12-23 DIAGNOSIS — G8929 Other chronic pain: Secondary | ICD-10-CM | POA: Diagnosis present

## 2016-12-23 DIAGNOSIS — E1165 Type 2 diabetes mellitus with hyperglycemia: Secondary | ICD-10-CM | POA: Diagnosis present

## 2016-12-23 DIAGNOSIS — Z23 Encounter for immunization: Secondary | ICD-10-CM

## 2016-12-23 DIAGNOSIS — T82898A Other specified complication of vascular prosthetic devices, implants and grafts, initial encounter: Secondary | ICD-10-CM | POA: Diagnosis present

## 2016-12-23 DIAGNOSIS — M869 Osteomyelitis, unspecified: Secondary | ICD-10-CM | POA: Diagnosis present

## 2016-12-23 DIAGNOSIS — Z833 Family history of diabetes mellitus: Secondary | ICD-10-CM

## 2016-12-23 DIAGNOSIS — E876 Hypokalemia: Secondary | ICD-10-CM | POA: Diagnosis present

## 2016-12-23 DIAGNOSIS — M549 Dorsalgia, unspecified: Secondary | ICD-10-CM | POA: Diagnosis present

## 2016-12-23 DIAGNOSIS — E1169 Type 2 diabetes mellitus with other specified complication: Secondary | ICD-10-CM | POA: Diagnosis present

## 2016-12-23 DIAGNOSIS — R6521 Severe sepsis with septic shock: Secondary | ICD-10-CM | POA: Diagnosis present

## 2016-12-23 DIAGNOSIS — K219 Gastro-esophageal reflux disease without esophagitis: Secondary | ICD-10-CM | POA: Diagnosis present

## 2016-12-23 DIAGNOSIS — Y831 Surgical operation with implant of artificial internal device as the cause of abnormal reaction of the patient, or of later complication, without mention of misadventure at the time of the procedure: Secondary | ICD-10-CM | POA: Diagnosis present

## 2016-12-23 DIAGNOSIS — Z794 Long term (current) use of insulin: Secondary | ICD-10-CM

## 2016-12-23 DIAGNOSIS — K5900 Constipation, unspecified: Secondary | ICD-10-CM | POA: Diagnosis not present

## 2016-12-23 DIAGNOSIS — Z8739 Personal history of other diseases of the musculoskeletal system and connective tissue: Secondary | ICD-10-CM | POA: Diagnosis not present

## 2016-12-23 DIAGNOSIS — R739 Hyperglycemia, unspecified: Secondary | ICD-10-CM

## 2016-12-23 DIAGNOSIS — F17210 Nicotine dependence, cigarettes, uncomplicated: Secondary | ICD-10-CM | POA: Diagnosis present

## 2016-12-23 DIAGNOSIS — E11628 Type 2 diabetes mellitus with other skin complications: Secondary | ICD-10-CM | POA: Diagnosis not present

## 2016-12-23 DIAGNOSIS — K08409 Partial loss of teeth, unspecified cause, unspecified class: Secondary | ICD-10-CM | POA: Diagnosis not present

## 2016-12-23 DIAGNOSIS — J449 Chronic obstructive pulmonary disease, unspecified: Secondary | ICD-10-CM | POA: Diagnosis present

## 2016-12-23 DIAGNOSIS — M86661 Other chronic osteomyelitis, right tibia and fibula: Secondary | ICD-10-CM | POA: Diagnosis not present

## 2016-12-23 DIAGNOSIS — L97309 Non-pressure chronic ulcer of unspecified ankle with unspecified severity: Secondary | ICD-10-CM | POA: Diagnosis not present

## 2016-12-23 DIAGNOSIS — IMO0002 Reserved for concepts with insufficient information to code with codable children: Secondary | ICD-10-CM

## 2016-12-23 DIAGNOSIS — I959 Hypotension, unspecified: Secondary | ICD-10-CM | POA: Diagnosis present

## 2016-12-23 DIAGNOSIS — L97509 Non-pressure chronic ulcer of other part of unspecified foot with unspecified severity: Secondary | ICD-10-CM | POA: Diagnosis not present

## 2016-12-23 LAB — CBC WITH DIFFERENTIAL/PLATELET
BASOS ABS: 0 10*3/uL (ref 0.0–0.1)
BASOS PCT: 0 %
EOS ABS: 0.3 10*3/uL (ref 0.0–0.7)
EOS PCT: 2 %
HCT: 39.9 % (ref 36.0–46.0)
Hemoglobin: 13.2 g/dL (ref 12.0–15.0)
Lymphocytes Relative: 19 %
Lymphs Abs: 3.2 10*3/uL (ref 0.7–4.0)
MCH: 26.7 pg (ref 26.0–34.0)
MCHC: 33.1 g/dL (ref 30.0–36.0)
MCV: 80.8 fL (ref 78.0–100.0)
Monocytes Absolute: 0.9 10*3/uL (ref 0.1–1.0)
Monocytes Relative: 6 %
Neutro Abs: 12.4 10*3/uL — ABNORMAL HIGH (ref 1.7–7.7)
Neutrophils Relative %: 73 %
PLATELETS: 440 10*3/uL — AB (ref 150–400)
RBC: 4.94 MIL/uL (ref 3.87–5.11)
RDW: 14.5 % (ref 11.5–15.5)
WBC: 16.9 10*3/uL — AB (ref 4.0–10.5)

## 2016-12-23 LAB — URINALYSIS, ROUTINE W REFLEX MICROSCOPIC
BILIRUBIN URINE: NEGATIVE
Glucose, UA: >=500 mg/dL — AB
HGB URINE DIPSTICK: NEGATIVE
Ketones, ur: NEGATIVE mg/dL
Leukocytes, UA: NEGATIVE
NITRITE: NEGATIVE
PH: 6 (ref 5.0–8.0)
Protein, ur: NEGATIVE mg/dL
SPECIFIC GRAVITY, URINE: 1.002 — AB (ref 1.005–1.030)

## 2016-12-23 LAB — COMPREHENSIVE METABOLIC PANEL
ALT: 8 U/L — ABNORMAL LOW (ref 14–54)
ANION GAP: 16 — AB (ref 5–15)
AST: 14 U/L — ABNORMAL LOW (ref 15–41)
Albumin: 3 g/dL — ABNORMAL LOW (ref 3.5–5.0)
Alkaline Phosphatase: 85 U/L (ref 38–126)
BUN: 25 mg/dL — ABNORMAL HIGH (ref 6–20)
CALCIUM: 9.9 mg/dL (ref 8.9–10.3)
CO2: 30 mmol/L (ref 22–32)
Chloride: 84 mmol/L — ABNORMAL LOW (ref 101–111)
Creatinine, Ser: 1.1 mg/dL — ABNORMAL HIGH (ref 0.44–1.00)
GFR calc non Af Amer: 57 mL/min — ABNORMAL LOW (ref >60)
Glucose, Bld: 357 mg/dL — ABNORMAL HIGH (ref 65–99)
Potassium: 3.7 mmol/L (ref 3.5–5.1)
SODIUM: 130 mmol/L — AB (ref 135–145)
Total Bilirubin: 0.4 mg/dL (ref 0.3–1.2)
Total Protein: 7.5 g/dL (ref 6.5–8.1)

## 2016-12-23 LAB — I-STAT CG4 LACTIC ACID, ED
LACTIC ACID, VENOUS: 4.06 mmol/L — AB (ref 0.5–1.9)
Lactic Acid, Venous: 2.01 mmol/L (ref 0.5–1.9)

## 2016-12-23 LAB — I-STAT ARTERIAL BLOOD GAS, ED
Acid-Base Excess: 4 mmol/L — ABNORMAL HIGH (ref 0.0–2.0)
Bicarbonate: 29.6 mmol/L — ABNORMAL HIGH (ref 20.0–28.0)
O2 Saturation: 91 %
PCO2 ART: 48.1 mmHg — AB (ref 32.0–48.0)
PH ART: 7.396 (ref 7.350–7.450)
TCO2: 31 mmol/L (ref 0–100)
pO2, Arterial: 63 mmHg — ABNORMAL LOW (ref 83.0–108.0)

## 2016-12-23 LAB — D-DIMER, QUANTITATIVE (NOT AT ARMC): D DIMER QUANT: 0.72 ug{FEU}/mL — AB (ref 0.00–0.50)

## 2016-12-23 LAB — PROTIME-INR
INR: 1.06
PROTHROMBIN TIME: 13.8 s (ref 11.4–15.2)

## 2016-12-23 MED ORDER — SODIUM CHLORIDE 0.9 % IV BOLUS (SEPSIS)
1000.0000 mL | Freq: Once | INTRAVENOUS | Status: AC
  Administered 2016-12-23: 1000 mL via INTRAVENOUS

## 2016-12-23 MED ORDER — DEXTROSE 5 % IV SOLN
1.0000 g | Freq: Three times a day (TID) | INTRAVENOUS | Status: DC
  Filled 2016-12-23: qty 1

## 2016-12-23 MED ORDER — ALBUTEROL SULFATE (2.5 MG/3ML) 0.083% IN NEBU
5.0000 mg | INHALATION_SOLUTION | Freq: Once | RESPIRATORY_TRACT | Status: AC
  Administered 2016-12-23: 5 mg via RESPIRATORY_TRACT
  Filled 2016-12-23: qty 6

## 2016-12-23 MED ORDER — VANCOMYCIN HCL IN DEXTROSE 1-5 GM/200ML-% IV SOLN
1000.0000 mg | Freq: Once | INTRAVENOUS | Status: AC
  Administered 2016-12-23: 1000 mg via INTRAVENOUS
  Filled 2016-12-23: qty 200

## 2016-12-23 MED ORDER — IPRATROPIUM BROMIDE 0.02 % IN SOLN
0.5000 mg | Freq: Once | RESPIRATORY_TRACT | Status: AC
  Administered 2016-12-23: 0.5 mg via RESPIRATORY_TRACT
  Filled 2016-12-23: qty 2.5

## 2016-12-23 MED ORDER — SODIUM CHLORIDE 0.9 % IV SOLN: INTRAVENOUS | Status: AC

## 2016-12-23 MED ORDER — INSULIN ASPART 100 UNIT/ML ~~LOC~~ SOLN
10.0000 [IU] | Freq: Once | SUBCUTANEOUS | Status: AC
  Administered 2016-12-25: 15 [IU] via SUBCUTANEOUS

## 2016-12-23 MED ORDER — CEFEPIME HCL 2 G IJ SOLR
2.0000 g | Freq: Once | INTRAMUSCULAR | Status: AC
  Administered 2016-12-23: 2 g via INTRAVENOUS
  Filled 2016-12-23: qty 2

## 2016-12-23 MED ORDER — INSULIN ASPART 100 UNIT/ML ~~LOC~~ SOLN
0.0000 [IU] | Freq: Three times a day (TID) | SUBCUTANEOUS | Status: DC
  Administered 2016-12-24: 5 [IU] via SUBCUTANEOUS
  Administered 2016-12-24 – 2016-12-25 (×2): 3 [IU] via SUBCUTANEOUS
  Administered 2016-12-25: 15 [IU] via SUBCUTANEOUS
  Administered 2016-12-27: 3 [IU] via SUBCUTANEOUS
  Administered 2016-12-27 – 2016-12-28 (×2): 2 [IU] via SUBCUTANEOUS
  Administered 2016-12-29 – 2016-12-30 (×3): 3 [IU] via SUBCUTANEOUS
  Administered 2016-12-31 – 2017-01-01 (×2): 2 [IU] via SUBCUTANEOUS

## 2016-12-23 MED ORDER — SODIUM CHLORIDE 0.9 % IV BOLUS (SEPSIS)
1000.0000 mL | Freq: Once | INTRAVENOUS | Status: AC
  Administered 2016-12-24: 1000 mL via INTRAVENOUS

## 2016-12-23 MED ORDER — VANCOMYCIN HCL 500 MG IV SOLR
500.0000 mg | Freq: Two times a day (BID) | INTRAVENOUS | Status: DC
  Filled 2016-12-23: qty 500

## 2016-12-23 NOTE — ED Notes (Signed)
EDP at bedside  

## 2016-12-23 NOTE — ED Triage Notes (Signed)
Sent today from dr office  For low bp , seen at the pain clinic  Seen for same before

## 2016-12-23 NOTE — ED Notes (Signed)
89% on RA. Placed on 2L [place], SpO2 improved.

## 2016-12-23 NOTE — ED Provider Notes (Signed)
[place] DEPT Provider Note   CSN: [id number] Arrival date & time: 12/23/16  1811     History   Chief Complaint Chief Complaint  Patient presents with  . Hypotension    HPI [name] is a 51 y.o. female.  Patient with hx asthma, chronic pain, dm, chronic buttock wound, sent from her doctor's office with low blood pressure.  Patient denies fever or chills. Was feeling generally weak earlier. States buttock wound is looking better than previous. States compliant w normal meds. Pt denies cough or uri symptoms. No abd pain or vomiting/diarrhea. No dysuria or gu c/o.    The history is provided by the patient.    Past Medical History:  Diagnosis Date  . Arthritis   . Asthma   . Chronic back pain   . Depression   . Diabetes mellitus   . Diabetic neuropathy ([place])   . Family history of adverse reaction to anesthesia    mother had difficlty waking   . GERD (gastroesophageal reflux disease)   . Hyperlipidemia   . Joint pain   . Leg pain    With Walking  . PAD (peripheral artery disease) ([place])   . Peripheral arterial disease ([place])   . Reflux   . Ulcer [facility])    Foot    Patient Active Problem List   Diagnosis Date Noted  . Sacral decubitus ulcer 11/27/2016  . DM2 (diabetes mellitus, type 2) ([place]) 11/27/2016  . Chronic back pain 11/27/2016  . Orthostatic hypotension 11/27/2016  . Demand ischemia ([place])   . Other hyperlipidemia   . Right sided weakness   . Retinal tumor of right eye   . Pressure injury of skin 10/01/2016  . Acute respiratory failure ([place])   . Wound infection 09/30/2016  . Necrotizing fasciitis ([place]) 09/30/2016  . Hyperglycemia   . 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 Paresthesia of both hands 03/05/2016  . DDD (degenerative disc disease), lumbosacral 03/26/2015  . DDD (degenerative disc disease), cervical 03/26/2015  . Sacroiliac joint disease 03/26/2015  . Occipital neuralgia 03/26/2015  . Hip pain 10/07/2014  . Arm  heaviness 04/25/2014  . Weakness of both legs-Arms 04/25/2014  . Pain in limb-Bilateral Leg 04/25/2014  . PAD (peripheral artery disease) ([place]) 04/05/2013  . 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 Peripheral vascular disease, unspecified 02/10/2012  . 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 TRIGGER FINGER 02/27/2009    Past Surgical History:  Procedure Laterality Date  . ABDOMINAL AORTAGRAM  June 15, 2014  . ABDOMINAL AORTAGRAM N/A 06/15/2014   Procedure: ABDOMINAL Maxcine Ham;  Surgeon: Serafina Mitchell, MD;  Location[facility] CATH LAB;  Service: Cardiovascular;  Laterality: N/A;  . ABDOMINAL AORTAGRAM N/A 11/22/2014   Procedure: ABDOMINAL AORTAGRAM;  Surgeon: Serafina Mitchell, MD;  Location: [facility] CATH LAB;  Service: Cardiovascular;  Laterality: N/A;  . ARTERIAL BYPASS SURGRY  07/05/2010   Right Common Femoral to below knee popliteal BPG  . BACK SURGERY     X's  2  . CARDIAC CATHETERIZATION    . CHOLECYSTECTOMY     Gall Bladder  . CYSTECTOMY Right    foot  . CYSTECTOMY Left    wrist  . INTERCOSTAL NERVE BLOCK  November 2015  . IRRIGATION AND DEBRIDEMENT BUTTOCKS Right 09/30/2016   Procedure: DEBRIDEMENT RIGHT  BUTTOCK WOUND;  Surgeon: Georganna Skeans, MD;  Location: [place];  Service: General;  Laterality: Right;  . left foot surgery    .  left wrist cyst removal Left   . PERIPHERAL VASCULAR CATHETERIZATION N/A 05/07/2016   Procedure: Abdominal Aortogram;  Surgeon: Serafina Mitchell, MD;  Location: [place] CV LAB;  Service: Cardiovascular;  Laterality: N/A;  . PERIPHERAL VASCULAR CATHETERIZATION N/A 05/07/2016   Procedure: Lower Extremity Angiography;  Surgeon: Serafina Mitchell, MD;  Location: [place] CV LAB;  Service: Cardiovascular;  Laterality: N/A;  . PERIPHERAL VASCULAR CATHETERIZATION N/A 05/07/2016   Procedure: Aortic Arch Angiography;  Surgeon: Serafina Mitchell, MD;  Location: [place] CV LAB;  Service: Cardiovascular;  Laterality: N/A;  .  PERIPHERAL VASCULAR CATHETERIZATION N/A 05/07/2016   Procedure: Upper Extremity Angiography;  Surgeon: Serafina Mitchell, MD;  Location: [place] CV LAB;  Service: Cardiovascular;  Laterality: N/A;  . PERIPHERAL VASCULAR CATHETERIZATION Right 05/07/2016   Procedure: Peripheral Vascular Balloon Angioplasty;  Surgeon: Serafina Mitchell, MD;  Location: [place] CV LAB;  Service: Cardiovascular;  Laterality: Right;  subclavian  . PERIPHERAL VASCULAR CATHETERIZATION Right 05/07/2016   Procedure: Peripheral Vascular Intervention;  Surgeon: Serafina Mitchell, MD;  Location: [place] CV LAB;  Service: Cardiovascular;  Laterality: Right;  External  Iliac  . SKIN GRAFT Right 2012   [name] by Dr. Nils Pyle- Right and Left Ankle  . SPINE SURGERY    . TONSILLECTOMY      OB History    No data available       Home Medications    Prior to Admission medications   Medication Sig Start Date End Date Taking? Authorizing Provider  albuterol (PROVENTIL) 2 MG tablet Take 2 mg by mouth 3 (three) times daily as needed for shortness of breath.    Yes Historical Provider, MD  aspirin 81 MG tablet Take 81 mg by mouth daily.    Yes Historical Provider, MD  cetirizine (ZYRTEC) 10 MG tablet Take 10 mg by mouth daily as needed for allergies. Reported on 03/27/2016   Yes Historical Provider, MD  Choline Fenofibrate (FENOFIBRIC ACID) 45 MG CPDR Take 45 mg by mouth daily. Reported on 03/18/2016 10/30/15  Yes Historical Provider, MD  cyclobenzaprine (FLEXERIL) 10 MG tablet Limit 1 tablet by mouth 1  to  3 times per day if tolerated Patient taking differently: Take 10 mg by mouth 3 (three) times daily as needed for muscle spasms.  07/18/16  Yes Mohammed Kindle, MD  dicyclomine (BENTYL) 10 MG capsule Take 10 mg by mouth 4 (four) times daily as needed for spasms. 08/08/16 08/08/17 Yes Historical Provider, MD  furosemide (LASIX) 40 MG tablet Take 40 mg by mouth 2 (two) times daily as needed for fluid.    Yes Historical Provider, MD    HUMALOG KWIKPEN 100 UNIT/ML KiwkPen Inject 10 Units into the skin 3 (three) times daily. Per sliding scale 11/11/15  Yes Historical Provider, MD  HYDROcodone-acetaminophen (NORCO) 10-325 MG tablet Limit 1 tablet by mouth per day or twice per day if tolerated Patient taking differently: Take 1 tablet by mouth See admin instructions. Every 8-12 hours 07/18/16  Yes Mohammed Kindle, MD  LANTUS SOLOSTAR 100 UNIT/ML Solostar Pen Inject 36 Units into the skin 2 (two) times daily.  09/15/14  Yes Historical Provider, MD  meclizine (ANTIVERT) 25 MG tablet Take 25 mg by mouth 3 (three) times daily as needed for dizziness. 11/06/16  Yes Historical Provider, MD  omeprazole (PRILOSEC) 40 MG capsule Take 40 mg by mouth daily. 11/06/16  Yes Historical Provider, MD  pregabalin (LYRICA) 100 MG capsule Limit 1 tab by mouth twice a day  to 3 times a day if tolerated Patient taking differently: Take 100 mg by mouth 3 (three) times daily.  07/18/16  Yes Mohammed Kindle, MD  pseudoephedrine-guaifenesin [facility] D) 60-600 MG per tablet Take 1 tablet by mouth every 12 (twelve) hours as needed for congestion. Reported on 04/08/2016   Yes Historical Provider, MD    Family History Family History  Problem Relation Age of Onset  . Coronary artery disease Mother   . Peripheral vascular disease Mother   . Heart disease Mother     Before age 49  . Other Mother     Venous insuffiency  . Diabetes Mother   . Hyperlipidemia Mother   . Hypertension Mother   . Varicose Veins Mother   . Heart attack Mother     before age 68  . Heart disease Father   . Diabetes Father   . Diabetes Maternal Grandmother   . Diabetes Paternal Grandmother   . Diabetes Paternal Grandfather   . Diabetes Sister   . Hypertension Sister   . Diabetes Brother   . Hypertension Brother     Social History Social History  Substance Use Topics  . Smoking status: Current Every Day Smoker    Packs/day: 1.50    Years: 30.00    Types: Cigarettes  .  Smokeless tobacco: Never Used     Comment: 2 cigarrettes a day  . Alcohol use No     Allergies   Penicillins   Review of Systems Review of Systems  Constitutional: Negative for chills and fever.  HENT: Negative for sore throat.   Eyes: Negative for redness.  Respiratory: Negative for cough and shortness of breath.   Cardiovascular: Negative for chest pain.  Gastrointestinal: Negative for abdominal pain, diarrhea and vomiting.  Genitourinary: Negative for dysuria and flank pain.  Musculoskeletal: Positive for back pain. Negative for neck pain and neck stiffness.  Skin: Negative for rash.  Neurological: Negative for headaches.  Hematological: Does not bruise/bleed easily.  Psychiatric/Behavioral: Negative for confusion.     Physical Exam Updated Vital Signs BP (!) 84/54   Pulse 116   Temp 98.4 F (36.9 C) (Oral)   Resp 14   SpO2 98%   Physical Exam  Constitutional: She appears well-developed and well-nourished.  bp low.   Eyes: Conjunctivae are normal. Pupils are equal, round, and reactive to light. No scleral icterus.  Neck: Neck supple. No tracheal deviation present. No thyromegaly present.  No stiffness or rigidity  Cardiovascular: Normal rate, regular rhythm, normal heart sounds and intact distal pulses.  Exam reveals no gallop and no friction rub.   No murmur heard. Pulmonary/Chest: Effort normal and breath sounds normal. No respiratory distress.  Abdominal: Soft. Normal appearance and bowel sounds are normal. She exhibits no distension. There is no tenderness.  Genitourinary:  Genitourinary Comments: No cva tenderness  Musculoskeletal: She exhibits no edema.  No midline/spine tenderness. Well healed surgical scar lumbar region. Wound vac left buttock, no cellulitis.   Neurological: She is alert.  Skin: Skin is warm and dry. No rash noted.  Psychiatric: She has a normal mood and affect.  Nursing note and vitals reviewed.    ED Treatments / Results   Labs (all labs ordered are listed, but only abnormal results are displayed) Results for orders placed or performed during the hospital encounter of 12/23/16  Comprehensive metabolic panel  Result Value Ref Range   Sodium 130 (L) 135 - 145 mmol/L   Potassium 3.7 3.5 - 5.1 mmol/L  Chloride 84 (L) 101 - 111 mmol/L   CO2 30 22 - 32 mmol/L   Glucose, Bld 357 (H) 65 - 99 mg/dL   BUN 25 (H) 6 - 20 mg/dL   Creatinine, Ser 1.10 (H) 0.44 - 1.00 mg/dL   Calcium 9.9 8.9 - 10.3 mg/dL   Total Protein 7.5 6.5 - 8.1 g/dL   Albumin 3.0 (L) 3.5 - 5.0 g/dL   AST 14 (L) 15 - 41 U/L   ALT 8 (L) 14 - 54 U/L   Alkaline Phosphatase 85 38 - 126 U/L   Total Bilirubin 0.4 0.3 - 1.2 mg/dL   GFR calc non Af Amer 57 (L) >60 mL/min   GFR calc Af Amer >60 >60 mL/min   Anion gap 16 (H) 5 - 15  CBC with Differential  Result Value Ref Range   WBC 16.9 (H) 4.0 - 10.5 K/uL   RBC 4.94 3.87 - 5.11 MIL/uL   Hemoglobin 13.2 12.0 - 15.0 g/dL   HCT 39.9 36.0 - 46.0 %   MCV 80.8 78.0 - 100.0 fL   MCH 26.7 26.0 - 34.0 pg   MCHC 33.1 30.0 - 36.0 g/dL   RDW 14.5 11.5 - 15.5 %   Platelets 440 (H) 150 - 400 K/uL   Neutrophils Relative % 73 %   Neutro Abs 12.4 (H) 1.7 - 7.7 K/uL   Lymphocytes Relative 19 %   Lymphs Abs 3.2 0.7 - 4.0 K/uL   Monocytes Relative 6 %   Monocytes Absolute 0.9 0.1 - 1.0 K/uL   Eosinophils Relative 2 %   Eosinophils Absolute 0.3 0.0 - 0.7 K/uL   Basophils Relative 0 %   Basophils Absolute 0.0 0.0 - 0.1 K/uL  Protime-INR  Result Value Ref Range   Prothrombin Time 13.8 11.4 - 15.2 seconds   INR 1.06   I-Stat CG4 Lactic Acid, ED  Result Value Ref Range   Lactic Acid, Venous 4.06 (HH) 0.5 - 1.9 mmol/L   Comment NOTIFIED PHYSICIAN    Dg Chest 2 View  Result Date: 11/26/2016 CLINICAL DATA:  Low blood pressure wound on buttock EXAM: CHEST  2 VIEW COMPARISON:  10/01/2016 FINDINGS: The heart size and mediastinal contours are within normal limits. Both lungs are clear. The visualized skeletal  structures are unremarkable. Surgical clips in the right upper quadrant IMPRESSION: No active cardiopulmonary disease. Electronically Signed   By: Donavan Foil M.D.   On: 11/26/2016 21:37   Dg Chest Portable 1 View  Result Date: 12/23/2016 CLINICAL DATA:  Hypotension. EXAM: PORTABLE CHEST 1 VIEW COMPARISON:  11/26/2016, 10/01/2016 and earlier. FINDINGS: Cardiac silhouette upper normal in size for the AP portable technique, unchanged. Thoracic aorta mildly atherosclerotic, unchanged. Hilar and mediastinal contours otherwise unremarkable. Mildly prominent bronchovascular markings diffusely, unchanged. No new pulmonary parenchymal abnormalities. No visible pleural effusions. IMPRESSION: Stable mild changes of chronic bronchitis and/or asthma. No acute cardiopulmonary disease. Electronically Signed   By: Evangeline Dakin M.D.   On: 12/23/2016 19:06    EKG  EKG Interpretation None       Radiology Dg Chest Portable 1 View  Result Date: 12/23/2016 CLINICAL DATA:  Hypotension. EXAM: PORTABLE CHEST 1 VIEW COMPARISON:  11/26/2016, 10/01/2016 and earlier. FINDINGS: Cardiac silhouette upper normal in size for the AP portable technique, unchanged. Thoracic aorta mildly atherosclerotic, unchanged. Hilar and mediastinal contours otherwise unremarkable. Mildly prominent bronchovascular markings diffusely, unchanged. No new pulmonary parenchymal abnormalities. No visible pleural effusions. IMPRESSION: Stable mild changes of chronic bronchitis and/or asthma. No acute  cardiopulmonary disease. Electronically Signed   By: Evangeline Dakin M.D.   On: 12/23/2016 19:06    Procedures Procedures (including critical care time)  Medications Ordered in ED Medications  vancomycin (VANCOCIN) IVPB 1000 mg/200 mL premix (1,000 mg Intravenous New Bag/Given 12/23/16 2024)  vancomycin (VANCOCIN) 500 mg in sodium chloride 0.9 % 100 mL IVPB (not administered)  ceFEPIme (MAXIPIME) 1 g in dextrose 5 % 50 mL IVPB (not administered)    insulin aspart (novoLOG) injection 10 Units (not administered)  albuterol (PROVENTIL) (2.5 MG/3ML) 0.083% nebulizer solution 5 mg (5 mg Nebulization Given 12/23/16 1932)  ipratropium (ATROVENT) nebulizer solution 0.5 mg (0.5 mg Nebulization Given 12/23/16 1932)  sodium chloride 0.9 % bolus 1,000 mL (1,000 mLs Intravenous New Bag/Given 12/23/16 1931)    And  sodium chloride 0.9 % bolus 1,000 mL (1,000 mLs Intravenous New Bag/Given 12/23/16 2026)  ceFEPIme (MAXIPIME) 2 g in dextrose 5 % 50 mL IVPB (2 g Intravenous New Bag/Given 12/23/16 2024)     Initial Impression / Assessment and Plan / ED Course  I have reviewed the triage vital signs and the nursing notes.  Pertinent labs & imaging results that were available during my care of the patient were reviewed by me and considered in my medical decision making (see chart for details).  Reviewed nursing notes and prior charts for additional history.   Iv ns bolus. Labs.  Cxr. Continuous pulse ox and monitor. Report of initial bp in 80's.   Given low bp and elevated lactate, cultures sent, and then iv abx given.  30 cc/kg ns iv bolus.  Source of infection not entirely clear - does have chronic wound but reports looking improved and decreasing in size from prior, however, still may be source.   Medicine consulted for admission.  Recheck, bp improved from prior.    CRITICAL CARE  RE:  Hypotension, elevated lactate, hyperglycemia.  Performed by: Mirna Mires Total critical care time: 40 minutes Critical care time was exclusive of separately billable procedures and treating other patients. Critical care was necessary to treat or prevent imminent or life-threatening deterioration. Critical care was time spent personally by me on the following activities: development of treatment plan with patient and/or surrogate as well as nursing, discussions with consultants, evaluation of patient's response to treatment, examination of patient, obtaining history  from patient or surrogate, ordering and performing treatments and interventions, ordering and review of laboratory studies, ordering and review of radiographic studies, pulse oximetry and re-evaluation of patient's condition.  Final Clinical Impressions(s) / ED Diagnoses   Final diagnoses:  None    New Prescriptions New Prescriptions   No medications on file     Lajean Saver, MD 12/23/16 2126

## 2016-12-23 NOTE — H&P (Addendum)
History and Physical    [name] BOF:[id number] DOB: [birth year] DOA: 12/23/2016  PCP: [name], MD Consultants:  Marion Downer - vascular; Primus Bravo - pain; Hart Carwin - wound doctor, [place], [place] Patient coming from: home - lives with husband; NOK: husband, [phone], [phone]  Chief Complaint: hypotension  HPI: [name] is a 51 y.o. female with medical history significant of PAD, DM with neuropathy, chronic pain, HLD, and depression presenting because she went to her pain management doctor and he sent her in because her BP was low.  Has a buttock wound and has been hurting with that as well as her back, neck.  Has intermittent diarrhea - 3 stools in the last 24 hours.  Last antibiotics was Z-pack.  No cough or SOB.  She did feel poorly yesterday, but has been feeling fine today.  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  Was sent home with wound vac, had it for one to several visits and then it was removed by the [facility] wound center.  When she went time before last, she was sent to the ER for debridement.  Dr. Rosendo Gros evaluated the patient in the ER and did a bedside debridement.  She had borderline hypotension but it resolved with IVF and her lactate was normal. She reports that the wound vac was replaced last Saturday.     ED Course: Per Dr. Ashok Cordia: Iv ns bolus. Labs. Cxr. Continuous pulse ox and monitor. Report of initial bp in 80's.  Given low bp and elevated lactate, cultures sent, and then iv abx given. 30 cc/kg ns iv bolus.  Source of infection not entirely clear - does have chronic wound but reports looking improved and decreasing in size from prior, however, still may be source.  Medicine consulted for admission.  Recheck, bp improved from prior.    Review of Systems: As per HPI; otherwise 10 point review of systems reviewed and negative.   Ambulatory Status:  Occasionally needs a cane/walker  Past  Medical History:  Diagnosis Date  . Arthritis   . Asthma   . Chronic back pain   . Depression   . Diabetes mellitus   . Diabetic neuropathy ([place])   . Family history of adverse reaction to anesthesia    mother had difficlty waking   . GERD (gastroesophageal reflux disease)   . Hyperlipidemia   . Joint pain   . Leg pain    With Walking  . PAD (peripheral artery disease) ([place])   . Reflux   . Ulcer ([place])    Foot    Past Surgical History:  Procedure Laterality Date  . ABDOMINAL AORTAGRAM  June 15, 2014  . ABDOMINAL AORTAGRAM N/A 06/15/2014   Procedure: ABDOMINAL Maxcine Ham;  Surgeon: Serafina Mitchell, MD;  Location: [facility] CATH LAB;  Service: Cardiovascular;  Laterality: N/A;  . ABDOMINAL AORTAGRAM N/A 11/22/2014   Procedure: ABDOMINAL AORTAGRAM;  Surgeon: Serafina Mitchell, MD;  Location: [facility] CATH LAB;  Service: Cardiovascular;  Laterality: N/A;  . ARTERIAL BYPASS SURGRY  07/05/2010   Right Common Femoral to below knee popliteal BPG  . BACK SURGERY     X's  2  . CARDIAC CATHETERIZATION    . CHOLECYSTECTOMY     Gall Bladder  . CYSTECTOMY Right    foot  . CYSTECTOMY Left    wrist  . INTERCOSTAL NERVE BLOCK  November 2015  . IRRIGATION AND DEBRIDEMENT BUTTOCKS Right 09/30/2016  Procedure: DEBRIDEMENT RIGHT  BUTTOCK WOUND;  Surgeon: Georganna Skeans, MD;  Location: [place];  Service: General;  Laterality: Right;  . left foot surgery    . left wrist cyst removal Left   . PERIPHERAL VASCULAR CATHETERIZATION N/A 05/07/2016   Procedure: Abdominal Aortogram;  Surgeon: Serafina Mitchell, MD;  Location: [place] CV LAB;  Service: Cardiovascular;  Laterality: N/A;  . PERIPHERAL VASCULAR CATHETERIZATION N/A 05/07/2016   Procedure: Lower Extremity Angiography;  Surgeon: Serafina Mitchell, MD;  Location: [place] CV LAB;  Service: Cardiovascular;  Laterality: N/A;  . PERIPHERAL VASCULAR CATHETERIZATION N/A 05/07/2016   Procedure: Aortic Arch Angiography;  Surgeon: Serafina Mitchell, MD;  Location: [place] CV LAB;  Service: Cardiovascular;  Laterality: N/A;  . PERIPHERAL VASCULAR CATHETERIZATION N/A 05/07/2016   Procedure: Upper Extremity Angiography;  Surgeon: Serafina Mitchell, MD;  Location: [place] CV LAB;  Service: Cardiovascular;  Laterality: N/A;  . PERIPHERAL VASCULAR CATHETERIZATION Right 05/07/2016   Procedure: Peripheral Vascular Balloon Angioplasty;  Surgeon: Serafina Mitchell, MD;  Location: [place] CV LAB;  Service: Cardiovascular;  Laterality: Right;  subclavian  . PERIPHERAL VASCULAR CATHETERIZATION Right 05/07/2016   Procedure: Peripheral Vascular Intervention;  Surgeon: Serafina Mitchell, MD;  Location: [place] CV LAB;  Service: Cardiovascular;  Laterality: Right;  External  Iliac  . SKIN GRAFT Right 2012   [name] by Dr. Nils Pyle- Right and Left Ankle  . SPINE SURGERY    . TONSILLECTOMY      Social History   Social History  . Marital status: Married    Spouse name: N/A  . Number of children: N/A  . Years of education: N/A   Occupational History  . disabled    Social History Main Topics  . Smoking status: Current Every Day Smoker    Packs/day: 1.50    Years: 30.00    Types: Cigarettes  . Smokeless tobacco: Never Used     Comment: 2 cigarettes a day  . Alcohol use No  . Drug use: No  . Sexual activity: Yes    Birth control/ protection: Post-menopausal   Other Topics Concern  . Not on file   Social History Narrative  . No narrative on file    Allergies  Allergen Reactions  . Penicillins Other (See Comments)    Severe Headache (not so sure about this now) Has patient had a PCN reaction causing immediate rash, facial/tongue/throat swelling, SOB or lightheadedness with hypotension: No Has patient had a PCN reaction causing severe rash involving mucus membranes or skin necrosis: No Has patient had a PCN reaction that required hospitalization: No Has patient had a PCN reaction occurring within the last 10 years: No If all of the above answers are  "NO", then may proceed with Cephalosporin use.     Family History  Problem Relation Age of Onset  . Coronary artery disease Mother   . Peripheral vascular disease Mother   . Heart disease Mother     Before age 25  . Other Mother     Venous insuffiency  . Diabetes Mother   . Hyperlipidemia Mother   . Hypertension Mother   . Varicose Veins Mother   . Heart attack Mother     before age 47  . Heart disease Father   . Diabetes Father   . Diabetes Maternal Grandmother   . Diabetes Paternal Grandmother   . Diabetes Paternal Grandfather   . Diabetes Sister   . Hypertension Sister   .  Diabetes Brother   . Hypertension Brother     Prior to Admission medications   Medication Sig Start Date End Date Taking? Authorizing Provider  albuterol (PROVENTIL) 2 MG tablet Take 2 mg by mouth 3 (three) times daily as needed for shortness of breath.    Yes Historical Provider, MD  aspirin 81 MG tablet Take 81 mg by mouth daily.    Yes Historical Provider, MD  cetirizine (ZYRTEC) 10 MG tablet Take 10 mg by mouth daily as needed for allergies. Reported on 03/27/2016   Yes Historical Provider, MD  Choline Fenofibrate (FENOFIBRIC ACID) 45 MG CPDR Take 45 mg by mouth daily. Reported on 03/18/2016 10/30/15  Yes Historical Provider, MD  cyclobenzaprine (FLEXERIL) 10 MG tablet Limit 1 tablet by mouth 1  to  3 times per day if tolerated Patient taking differently: Take 10 mg by mouth 3 (three) times daily as needed for muscle spasms.  07/18/16  Yes Mohammed Kindle, MD  dicyclomine (BENTYL) 10 MG capsule Take 10 mg by mouth 4 (four) times daily as needed for spasms. 08/08/16 08/08/17 Yes Historical Provider, MD  furosemide (LASIX) 40 MG tablet Take 40 mg by mouth 2 (two) times daily as needed for fluid.    Yes Historical Provider, MD  HUMALOG KWIKPEN 100 UNIT/ML KiwkPen Inject 10 Units into the skin 3 (three) times daily. Per sliding scale 11/11/15  Yes Historical Provider, MD  HYDROcodone-acetaminophen (NORCO) 10-325  MG tablet Limit 1 tablet by mouth per day or twice per day if tolerated Patient taking differently: Take 1 tablet by mouth See admin instructions. Every 8-12 hours 07/18/16  Yes Mohammed Kindle, MD  LANTUS SOLOSTAR 100 UNIT/ML Solostar Pen Inject 36 Units into the skin 2 (two) times daily.  09/15/14  Yes Historical Provider, MD  meclizine (ANTIVERT) 25 MG tablet Take 25 mg by mouth 3 (three) times daily as needed for dizziness. 11/06/16  Yes Historical Provider, MD  omeprazole (PRILOSEC) 40 MG capsule Take 40 mg by mouth daily. 11/06/16  Yes Historical Provider, MD  pregabalin (LYRICA) 100 MG capsule Limit 1 tab by mouth twice a day to 3 times a day if tolerated Patient taking differently: Take 100 mg by mouth 3 (three) times daily.  07/18/16  Yes Mohammed Kindle, MD  pseudoephedrine-guaifenesin [facility] D) 60-600 MG per tablet Take 1 tablet by mouth every 12 (twelve) hours as needed for congestion. 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 No LE edema.  Respiratory:  CTA bilaterally, no w/r/r. Normal respiratory effort. Abdomen:  soft, ntnd, NABS Skin:  Buttocks wound vac is in place and the surrounding region appears clean and without erythema.  There is an ulcer on the right posterior ankle that has purulent drainage and mild surrounding erythema. Musculoskeletal:  grossly normal tone BUE/BLE, good ROM, no bony abnormality Psychiatric:  flat affect, anxious, speech fluent and appropriate,  AOx3 Neurologic:  CN 2-12 grossly intact, moves all extremities in coordinated fashion, sensation intact  Labs on Admission: I have personally reviewed following labs and imaging studies  CBC:  Recent Labs Lab 12/23/16 1855  WBC 16.9*  NEUTROABS 12.4*  HGB 13.2  HCT 39.9  MCV 80.8  PLT 967*   Basic Metabolic Panel:  Recent Labs Lab 12/23/16 1855  NA 130*  K 3.7  CL 84*  CO2 30  GLUCOSE 357*  BUN 25*  CREATININE 1.10*  CALCIUM 9.9   GFR: CrCl cannot be calculated (Unknown ideal weight.). Liver Function Tests:  Recent Labs Lab 12/23/16 1855  AST 14*  ALT 8*  ALKPHOS 85  BILITOT 0.4  PROT 7.5  ALBUMIN 3.0*   No results for input(s): LIPASE, AMYLASE in the last 168 hours. No results for input(s): AMMONIA in the last 168 hours. Coagulation Profile:  Recent Labs Lab 12/23/16 1855  INR 1.06   Cardiac Enzymes: No results for input(s): CKTOTAL, CKMB, CKMBINDEX, TROPONINI in the last 168 hours. BNP (last 3 results) No results for input(s): PROBNP in the last 8760 hours. HbA1C: No results for input(s): HGBA1C in the last 72 hours. CBG: No results for input(s): GLUCAP in the last 168 hours. Lipid Profile: No results for input(s): CHOL, HDL, LDLCALC, TRIG, CHOLHDL, LDLDIRECT in the last 72 hours. Thyroid Function Tests: No results for input(s): TSH, T4TOTAL, FREET4, T3FREE, THYROIDAB in the last 72 hours. Anemia Panel: No results for input(s): VITAMINB12, FOLATE, FERRITIN, TIBC, IRON, RETICCTPCT in the last 72 hours. Urine analysis:    Component Value Date/Time   COLORURINE STRAW (A) 12/23/2016 2221   APPEARANCEUR CLEAR 12/23/2016 2221   LABSPEC 1.002 (L) 12/23/2016 2221   PHURINE 6.0 12/23/2016 2221   GLUCOSEU >=500 (A) 12/23/2016 2221   HGBUR NEGATIVE 12/23/2016 [place] 12/23/2016 2221   KETONESUR NEGATIVE 12/23/2016 2221   PROTEINUR NEGATIVE 12/23/2016 2221   UROBILINOGEN 0.2 06/25/2011 1707   NITRITE NEGATIVE 12/23/2016 2221    LEUKOCYTESUR NEGATIVE 12/23/2016 2221    Creatinine Clearance: CrCl cannot be calculated (Unknown ideal weight.).  Sepsis Labs: @LABRCNTIP (procalcitonin:4,lacticidven:4) )No results found for this or any previous visit (from the past 240 hour(s)).   Radiological Exams on Admission: Dg Chest Portable 1 View  Result Date: 12/23/2016 CLINICAL DATA:  Hypotension. EXAM: PORTABLE CHEST 1 VIEW COMPARISON:  11/26/2016, 10/01/2016 and earlier. FINDINGS: Cardiac silhouette upper normal in size for the AP portable technique, unchanged. Thoracic aorta mildly atherosclerotic, unchanged. Hilar and mediastinal contours otherwise unremarkable. Mildly prominent bronchovascular markings diffusely, unchanged. No new pulmonary parenchymal abnormalities. No visible pleural effusions. IMPRESSION: Stable mild changes of chronic bronchitis and/or asthma. No acute cardiopulmonary disease. Electronically Signed   By: Evangeline Dakin M.D.   On: 12/23/2016 19:06    EKG: not done  Assessment/Plan Principal Problem:   Sepsis ([place]) Active Problems:   PAD (peripheral artery disease) (H[facility])   Sacral decubitus ulcer   DM2 (diabetes mellitus, type 2) (HCC)   Hypotension   Diabetic ulcer of ankle (HCC)   AKI (acute kidney injury) ([place])   Malnutrition ([place])   Thrush   Chronic pain   Sepsis with associated hypotension -Elevated WBC count (16.9), tachycardia with elevated lactate to 4.06 (repeat 2.01) and marked hypotension that has responded to IVF but recurred -Concern for septic shock -Sepsis protocol initiated -PCCM notified; they will monitor the patient peripherally for now but are happy to consult if her circumstances worsen -Suspect diabetic foot ulcer as source (see below) -  She also has h/o sacral ulcer with necrotizing fasciitis and current wound vac that is less likely the source -She denies all respiratory symptoms and has a negative CXR but she is mildly hypoxic/hypoxemic for uncertain reasons, ABG:  7.396, pCO2 48.1, pO2 63, 91% -h/o subacute diarrhea, has had fairly recent abx, will add C diff testing -Blood and urine cultures pending, negative UA -Will admit to SDU with telemetry and continue to monitor -Treat with IV Cefepime, Vanc, and Flagyl (PO) as per diabetic foot ulcer algorithm -Will add HIV, particularly in light of oropharyngeal thrush seen on exam -Will trend lactate to ensure improvement  PAD with diabetic ankle ulcer -Patient is followed by vascular and is due for ABI testing soon -Will add ABIs -MRI right ankle to assess for osteomyelitis -Consults to CM, SW, diabetes coordinator, wound nurse as per order set -Likely to need vascular surgery and/or orthopedics consult  DM -Glucose 357 -Na 130, 132 on 1/25, 138 on 1/11 (normal glucose); today it corrects to 134 -Continue Lantus -Cover with SSI  AKI -Creatinine 1.10, 0.74 on 1/25, 0.45 on 1/11 -Anticipate resolution with ongoing IV hydration  Malnutrition -Albumin 3.0, 2.8 on 1/25 -Nutrition consult  Thrush -May simply be related to poorly controlled DM -Will check HIV -Treat with IV Diflucan for now  Chronic pain -I have reviewed this patient in the [place] Controlled Substances Reporting System.  She is receiving medications from only one provider and appears to be taking them as prescribed. -Will check UDS.  DVT prophylaxis: Lovenox  Code Status: Full - confirmed with patient Family Communication: None present Disposition Plan: Home once clinically improved Consults called: PCCM (from the [employer]); SW/CM/DM education/Nutrition  Admission status: Admit - It is my clinical opinion that admission to INPATIENT is reasonable and necessary because this patient will require at least 2 midnights in the hospital to treat this condition based on the medical complexity of the problems presented.  Given the aforementioned information, the predictability of an adverse outcome is felt to be significant.    Karmen Bongo MD [name]  If 7PM-7AM, please contact night-coverage [email or web address] Password [email or web address]  12/24/2016, 12:04 AM

## 2016-12-23 NOTE — ED Notes (Signed)
Pt here for hotn, seen at pain clinic and sent here for hotn. Hx same. Pt has wound vac on buttocks. Pt denies dizziness, main complaint is pain.

## 2016-12-23 NOTE — Progress Notes (Signed)
Pharmacy Antibiotic Note  [name] is a 51 y.o. female admitted on 12/23/2016 with pneumonia.  Pt presents from clinic hypotensive and has PMH of chronic buttock wound with recent ED visit for debridement of wound January 2018. Pharmacy has been consulted for vancomycin and cefepime dosing. WBC elevated, CrCl ~51 ml/min, cultures pending. Pt has noted PCN allergy but appears to have tolerated cefepime in the past.  Plan: -Vancomycin 1g IV per MD -Cefepime 2g IV per MD -Vancomycin 500mg  IV q12h -Cefepime 1g IV q8h -F/U LOT, cultures, VT as indicated     Temp (24hrs), Avg:98.4 F (36.9 C), Min:98.4 F (36.9 C), Max:98.4 F (36.9 C)   Recent Labs Lab 12/23/16 1855 12/23/16 1914  WBC 16.9*  --   CREATININE 1.10*  --   LATICACIDVEN  --  4.06*    CrCl cannot be calculated (Unknown ideal weight.).    Allergies  Allergen Reactions  . Penicillins Other (See Comments)    Severe Headache (not so sure about this now) Has patient had a PCN reaction causing immediate rash, facial/tongue/throat swelling, SOB or lightheadedness with hypotension: No Has patient had a PCN reaction causing severe rash involving mucus membranes or skin necrosis: No Has patient had a PCN reaction that required hospitalization: No Has patient had a PCN reaction occurring within the last 10 years: No If all of the above answers are "NO", then may proceed with Cephalosporin use.     Antimicrobials this admission: 2/5 Vancomycin >>  2/5 Cefepime >>   Dose adjustments this admission: none  Microbiology results: 2/5 BCx: IP 2/5 UCx: IP   Thank you for allowing pharmacy to be a part of this patient's care.  Arrie Senate, PharmD PGY-1 Pharmacy Resident Pager: [phone] 12/23/2016

## 2016-12-24 ENCOUNTER — Inpatient Hospital Stay (HOSPITAL_COMMUNITY): Payer: Medicare Other

## 2016-12-24 DIAGNOSIS — S31829D Unspecified open wound of left buttock, subsequent encounter: Secondary | ICD-10-CM

## 2016-12-24 DIAGNOSIS — I959 Hypotension, unspecified: Secondary | ICD-10-CM

## 2016-12-24 DIAGNOSIS — E11622 Type 2 diabetes mellitus with other skin ulcer: Secondary | ICD-10-CM

## 2016-12-24 DIAGNOSIS — E11621 Type 2 diabetes mellitus with foot ulcer: Secondary | ICD-10-CM

## 2016-12-24 DIAGNOSIS — G8929 Other chronic pain: Secondary | ICD-10-CM | POA: Diagnosis present

## 2016-12-24 DIAGNOSIS — Z794 Long term (current) use of insulin: Secondary | ICD-10-CM

## 2016-12-24 DIAGNOSIS — N179 Acute kidney failure, unspecified: Secondary | ICD-10-CM

## 2016-12-24 DIAGNOSIS — A419 Sepsis, unspecified organism: Principal | ICD-10-CM

## 2016-12-24 DIAGNOSIS — E11628 Type 2 diabetes mellitus with other skin complications: Secondary | ICD-10-CM

## 2016-12-24 DIAGNOSIS — L97309 Non-pressure chronic ulcer of unspecified ankle with unspecified severity: Secondary | ICD-10-CM

## 2016-12-24 LAB — BASIC METABOLIC PANEL
Anion gap: 11 (ref 5–15)
BUN: 15 mg/dL (ref 6–20)
CHLORIDE: 99 mmol/L — AB (ref 101–111)
CO2: 28 mmol/L (ref 22–32)
Calcium: 8.6 mg/dL — ABNORMAL LOW (ref 8.9–10.3)
Creatinine, Ser: 0.75 mg/dL (ref 0.44–1.00)
GFR calc non Af Amer: >60 mL/min (ref >60)
Glucose, Bld: 344 mg/dL — ABNORMAL HIGH (ref 65–99)
POTASSIUM: 3.2 mmol/L — AB (ref 3.5–5.1)
SODIUM: 138 mmol/L (ref 135–145)

## 2016-12-24 LAB — CBC
HEMATOCRIT: 32.7 % — AB (ref 36.0–46.0)
Hemoglobin: 10.4 g/dL — ABNORMAL LOW (ref 12.0–15.0)
MCH: 25.8 pg — ABNORMAL LOW (ref 26.0–34.0)
MCHC: 31.8 g/dL (ref 30.0–36.0)
MCV: 81.1 fL (ref 78.0–100.0)
PLATELETS: 399 10*3/uL (ref 150–400)
RBC: 4.03 MIL/uL (ref 3.87–5.11)
RDW: 14.5 % (ref 11.5–15.5)
WBC: 13.2 10*3/uL — AB (ref 4.0–10.5)

## 2016-12-24 LAB — GLUCOSE, CAPILLARY
GLUCOSE-CAPILLARY: 328 mg/dL — AB (ref 65–99)
GLUCOSE-CAPILLARY: 202 mg/dL — AB (ref 65–99)
GLUCOSE-CAPILLARY: 344 mg/dL — AB (ref 65–99)
Glucose-Capillary: 193 mg/dL — ABNORMAL HIGH (ref 65–99)

## 2016-12-24 LAB — RAPID URINE DRUG SCREEN, HOSP PERFORMED
Amphetamines: NOT DETECTED
BENZODIAZEPINES: NOT DETECTED
Barbiturates: NOT DETECTED
Cocaine: NOT DETECTED
Opiates: POSITIVE — AB
Tetrahydrocannabinol: NOT DETECTED

## 2016-12-24 LAB — C-REACTIVE PROTEIN: CRP: 5.4 mg/dL — ABNORMAL HIGH (ref <1.0)

## 2016-12-24 LAB — LACTIC ACID, PLASMA
Lactic Acid, Venous: 2 mmol/L (ref 0.5–1.9)
Lactic Acid, Venous: 1.2 mmol/L (ref 0.5–1.9)
Lactic Acid, Venous: 0.9 mmol/L (ref 0.5–1.9)

## 2016-12-24 LAB — PROCALCITONIN: Procalcitonin: 0.13 ng/mL

## 2016-12-24 LAB — MRSA PCR SCREENING: MRSA by PCR: NEGATIVE

## 2016-12-24 LAB — HIV ANTIBODY (ROUTINE TESTING W REFLEX): HIV Screen 4th Generation wRfx: NONREACTIVE

## 2016-12-24 LAB — PREALBUMIN: Prealbumin: 12.4 mg/dL — ABNORMAL LOW (ref 18–38)

## 2016-12-24 LAB — SEDIMENTATION RATE: Sed Rate: 78 mm/hr — ABNORMAL HIGH (ref 0–22)

## 2016-12-24 LAB — CORTISOL: CORTISOL PLASMA: 11.2 ug/dL

## 2016-12-24 MED ORDER — FLUCONAZOLE IN SODIUM CHLORIDE 100-0.9 MG/50ML-% IV SOLN
100.0000 mg | INTRAVENOUS | Status: DC
  Administered 2016-12-24: 100 mg via INTRAVENOUS
  Filled 2016-12-24 (×3): qty 50

## 2016-12-24 MED ORDER — CYCLOBENZAPRINE HCL 10 MG PO TABS
10.0000 mg | ORAL_TABLET | Freq: Three times a day (TID) | ORAL | Status: DC | PRN
  Administered 2016-12-24: 10 mg via ORAL
  Filled 2016-12-24: qty 1

## 2016-12-24 MED ORDER — ACETAMINOPHEN 325 MG PO TABS
650.0000 mg | ORAL_TABLET | Freq: Four times a day (QID) | ORAL | Status: DC | PRN
  Administered 2017-01-01: 650 mg via ORAL
  Filled 2016-12-24: qty 2

## 2016-12-24 MED ORDER — ONDANSETRON HCL 4 MG/2ML IJ SOLN: 4.0000 mg | Freq: Four times a day (QID) | INTRAMUSCULAR | Status: DC | PRN

## 2016-12-24 MED ORDER — LORAZEPAM 2 MG/ML IJ SOLN
1.0000 mg | Freq: Once | INTRAMUSCULAR | Status: DC
Start: 2016-12-24 — End: 2017-01-01

## 2016-12-24 MED ORDER — MORPHINE SULFATE (PF) 2 MG/ML IV SOLN
2.0000 mg | INTRAVENOUS | Status: DC | PRN
  Administered 2016-12-24 – 2017-01-01 (×20): 2 mg via INTRAVENOUS
  Filled 2016-12-24 (×22): qty 1

## 2016-12-24 MED ORDER — PREGABALIN 100 MG PO CAPS
100.0000 mg | ORAL_CAPSULE | Freq: Three times a day (TID) | ORAL | Status: DC
  Administered 2016-12-24 – 2017-01-01 (×26): 100 mg via ORAL
  Filled 2016-12-24 (×4): qty 1
  Filled 2016-12-24: qty 2
  Filled 2016-12-24 (×20): qty 1
  Filled 2016-12-24: qty 2

## 2016-12-24 MED ORDER — DEXTROSE 5 % IV SOLN
1.0000 g | Freq: Three times a day (TID) | INTRAVENOUS | Status: DC
  Administered 2016-12-24 – 2016-12-29 (×15): 1 g via INTRAVENOUS
  Filled 2016-12-24 (×17): qty 1

## 2016-12-24 MED ORDER — METRONIDAZOLE 500 MG PO TABS
500.0000 mg | ORAL_TABLET | Freq: Three times a day (TID) | ORAL | Status: DC
  Administered 2016-12-24: 500 mg via ORAL
  Filled 2016-12-24 (×2): qty 1

## 2016-12-24 MED ORDER — SODIUM CHLORIDE 0.9% FLUSH
3.0000 mL | Freq: Two times a day (BID) | INTRAVENOUS | Status: DC
  Administered 2016-12-24 – 2017-01-01 (×9): 3 mL via INTRAVENOUS

## 2016-12-24 MED ORDER — ACETAMINOPHEN 650 MG RE SUPP: 650.0000 mg | Freq: Four times a day (QID) | RECTAL | Status: DC | PRN

## 2016-12-24 MED ORDER — POTASSIUM CHLORIDE CRYS ER 20 MEQ PO TBCR
30.0000 meq | EXTENDED_RELEASE_TABLET | ORAL | Status: DC
  Filled 2016-12-24: qty 1

## 2016-12-24 MED ORDER — SODIUM CHLORIDE 0.9 % IV BOLUS (SEPSIS)
1000.0000 mL | Freq: Once | INTRAVENOUS | Status: AC
  Administered 2016-12-24: 1000 mL via INTRAVENOUS

## 2016-12-24 MED ORDER — ONDANSETRON HCL 4 MG PO TABS: 4.0000 mg | ORAL_TABLET | Freq: Four times a day (QID) | ORAL | Status: DC | PRN

## 2016-12-24 MED ORDER — HYDROCODONE-ACETAMINOPHEN 10-325 MG PO TABS
1.0000 | ORAL_TABLET | Freq: Two times a day (BID) | ORAL | Status: DC | PRN
  Administered 2016-12-24 – 2017-01-01 (×13): 1 via ORAL
  Filled 2016-12-24 (×13): qty 1

## 2016-12-24 MED ORDER — PNEUMOCOCCAL VAC POLYVALENT 25 MCG/0.5ML IJ INJ
0.5000 mL | INJECTION | INTRAMUSCULAR | Status: AC
  Administered 2016-12-25: 0.5 mL via INTRAMUSCULAR
  Filled 2016-12-24: qty 0.5

## 2016-12-24 MED ORDER — VANCOMYCIN HCL 500 MG IV SOLR
500.0000 mg | Freq: Two times a day (BID) | INTRAVENOUS | Status: DC
  Administered 2016-12-24 – 2016-12-27 (×7): 500 mg via INTRAVENOUS
  Filled 2016-12-24 (×11): qty 500

## 2016-12-24 MED ORDER — INSULIN ASPART 100 UNIT/ML ~~LOC~~ SOLN
0.0000 [IU] | Freq: Every day | SUBCUTANEOUS | Status: DC
  Administered 2016-12-24: 4 [IU] via SUBCUTANEOUS
  Administered 2016-12-29 – 2016-12-31 (×2): 2 [IU] via SUBCUTANEOUS

## 2016-12-24 MED ORDER — FLUCONAZOLE 100 MG PO TABS
100.0000 mg | ORAL_TABLET | Freq: Every day | ORAL | Status: AC
  Administered 2016-12-24 – 2016-12-26 (×3): 100 mg via ORAL
  Filled 2016-12-24 (×3): qty 1

## 2016-12-24 MED ORDER — LACTATED RINGERS IV SOLN
INTRAVENOUS | Status: DC
  Administered 2016-12-24: 05:00:00 via INTRAVENOUS

## 2016-12-24 MED ORDER — MIDODRINE HCL 5 MG PO TABS
10.0000 mg | ORAL_TABLET | Freq: Three times a day (TID) | ORAL | Status: DC
  Administered 2016-12-24 – 2017-01-01 (×24): 10 mg via ORAL
  Filled 2016-12-24 (×26): qty 2

## 2016-12-24 MED ORDER — FLUDROCORTISONE ACETATE 0.1 MG PO TABS
0.1000 mg | ORAL_TABLET | Freq: Every day | ORAL | Status: DC
  Administered 2016-12-24 – 2016-12-25 (×2): 0.1 mg via ORAL
  Filled 2016-12-24 (×2): qty 1

## 2016-12-24 MED ORDER — ADULT MULTIVITAMIN W/MINERALS CH
1.0000 | ORAL_TABLET | Freq: Every day | ORAL | Status: DC
  Administered 2016-12-24 – 2017-01-01 (×9): 1 via ORAL
  Filled 2016-12-24 (×10): qty 1

## 2016-12-24 MED ORDER — INSULIN GLARGINE 100 UNIT/ML ~~LOC~~ SOLN
36.0000 [IU] | Freq: Two times a day (BID) | SUBCUTANEOUS | Status: DC
Start: 2016-12-24 — End: 2016-12-25
  Administered 2016-12-24 – 2016-12-25 (×4): 36 [IU] via SUBCUTANEOUS
  Filled 2016-12-24 (×5): qty 0.36

## 2016-12-24 MED ORDER — PANTOPRAZOLE SODIUM 40 MG PO TBEC
80.0000 mg | DELAYED_RELEASE_TABLET | Freq: Every day | ORAL | Status: DC
  Administered 2016-12-24 – 2017-01-01 (×9): 80 mg via ORAL
  Filled 2016-12-24 (×9): qty 2

## 2016-12-24 MED ORDER — POTASSIUM CHLORIDE 20 MEQ/15ML (10%) PO SOLN
30.0000 meq | Freq: Once | ORAL | Status: AC
  Administered 2016-12-24: 30 meq via ORAL
  Filled 2016-12-24: qty 30

## 2016-12-24 MED ORDER — SODIUM CHLORIDE 0.9 % IV SOLN
30.0000 meq | Freq: Once | INTRAVENOUS | Status: AC
  Administered 2016-12-24: 30 meq via INTRAVENOUS
  Filled 2016-12-24: qty 15

## 2016-12-24 MED ORDER — ORAL CARE MOUTH RINSE
15.0000 mL | Freq: Two times a day (BID) | OROMUCOSAL | Status: DC
  Administered 2016-12-24 – 2017-01-01 (×14): 15 mL via OROMUCOSAL

## 2016-12-24 MED ORDER — HYDROCORTISONE NA SUCCINATE PF 100 MG IJ SOLR
50.0000 mg | Freq: Four times a day (QID) | INTRAMUSCULAR | Status: DC
  Administered 2016-12-24 – 2016-12-25 (×5): 50 mg via INTRAVENOUS
  Filled 2016-12-24 (×3): qty 2
  Filled 2016-12-24: qty 1
  Filled 2016-12-24: qty 2

## 2016-12-24 MED ORDER — FENOFIBRATE 54 MG PO TABS
54.0000 mg | ORAL_TABLET | Freq: Every day | ORAL | Status: DC
  Administered 2016-12-24 – 2017-01-01 (×9): 54 mg via ORAL
  Filled 2016-12-24 (×9): qty 1

## 2016-12-24 MED ORDER — ENOXAPARIN SODIUM 40 MG/0.4ML ~~LOC~~ SOLN
40.0000 mg | Freq: Every day | SUBCUTANEOUS | Status: DC
  Administered 2016-12-24 – 2016-12-30 (×7): 40 mg via SUBCUTANEOUS
  Filled 2016-12-24 (×8): qty 0.4

## 2016-12-24 MED ORDER — SODIUM CHLORIDE 0.9 % IV SOLN
0.0000 ug/min | INTRAVENOUS | Status: DC
  Administered 2016-12-24: 20 ug/min via INTRAVENOUS
  Filled 2016-12-24 (×2): qty 1

## 2016-12-24 MED ORDER — SODIUM CHLORIDE 0.9 % IV SOLN
INTRAVENOUS | Status: DC
  Administered 2016-12-24 – 2016-12-28 (×6): via INTRAVENOUS

## 2016-12-24 MED ORDER — GADOBENATE DIMEGLUMINE 529 MG/ML IV SOLN
15.0000 mL | Freq: Once | INTRAVENOUS | Status: AC
  Administered 2016-12-24: 13 mL via INTRAVENOUS

## 2016-12-24 MED ORDER — DICYCLOMINE HCL 10 MG PO CAPS: 10.0000 mg | ORAL_CAPSULE | Freq: Four times a day (QID) | ORAL | Status: DC | PRN

## 2016-12-24 MED ORDER — ASPIRIN EC 81 MG PO TBEC
81.0000 mg | DELAYED_RELEASE_TABLET | Freq: Every day | ORAL | Status: DC
  Administered 2016-12-24 – 2017-01-01 (×9): 81 mg via ORAL
  Filled 2016-12-24 (×9): qty 1

## 2016-12-24 MED ORDER — DEXTROSE 5 % IV SOLN
1.0000 g | Freq: Three times a day (TID) | INTRAVENOUS | Status: DC
  Administered 2016-12-24: 1 g via INTRAVENOUS
  Filled 2016-12-24 (×2): qty 1

## 2016-12-24 MED ORDER — GLUCERNA SHAKE PO LIQD
237.0000 mL | Freq: Three times a day (TID) | ORAL | Status: DC
  Administered 2016-12-24 – 2016-12-31 (×20): 237 mL via ORAL

## 2016-12-24 NOTE — Progress Notes (Signed)
VASCULAR LAB PRELIMINARY  ARTERIAL  ABI completed: Right ABI of 0.45 is suggestive of severe arterial occlusive disease at rest.  Left ABI of 0.89 is suggestive of mild arterial occlusive disease at rest.   RIGHT    LEFT    PRESSURE WAVEFORM  PRESSURE WAVEFORM  BRACHIAL 116 Triphasic BRACHIAL 88 Biphasic  DP 42 Monophasic DP 103 Biphasic  PT 52 Monophasic PT 90 Biphasic  GREAT TOE  NA GREAT TOE 71 NA    RIGHT LEFT  ABI 0.45 0.89     Legrand Como, RVT 12/24/2016, 12:12 PM

## 2016-12-24 NOTE — Progress Notes (Signed)
Report called to 5W and pt transferred with VS WNL.

## 2016-12-24 NOTE — Consult Note (Signed)
Consult note Requesting physician: Dr. Titus Mould Chief complaint: Hypotension, sepsis, sacral decubitus.     Patient  is an 51 y.o. female.    Patient is a 51 year old female with a history of hypertension, chronic pain, tobacco abuse type 2 diabetes, right lower extremity lateral malleolus eschars who was admitted 1113/17 with necrotizing fasciitis. She underwent debridement of the right buttocks wound. It was 16 x 11 x 3 cm deep. During that hospitalization she had an NSTEMI, and right-sided weakness with stroke like symptoms. She was ultimately discharged and was being followed by the wound care clinic and Dr. Nils Pyle. She returned on 11/27/16 with hypotension and dizziness. Was re admitted with hypotension  BP in the 60's; with possible sepsis on 11/27/16.  She was reevaluated on 110/18 by Dr. Donnie Mesa. He recommended ongoing hydrotherapy and local wound care.  She returns now to the ED, from the wound care clinic with hypotension, BP in the 80s. Elevated lactate 4.06. Elevated WBC 16.9. Sedimentation rate of 78, d-dimer 0.72, glucose 357,urinalysis positive for glucose, no UTI. Drug screens positive for opiates. Blood cultures and urine cultures are pending. She was admitted for possible sepsis, and transferred to the intensive care unit. Early being followed by critical care medicine.  Additional medical history includes significant peripheral artery disease, diabetes with neuropathy. Chronic pain, hyperlipidemia, and depression.  We are ask to see. The two pictures below are from 11/27/16.    After some debridement and cleaning up of the wound    Wound after the dressing was taken down     Past Medical History:  Diagnosis Date  . Arthritis   . Asthma   . Chronic back pain   . Depression   . Diabetes mellitus   . Diabetic neuropathy ([place])   . Family history of adverse reaction to anesthesia    mother had difficlty waking   . GERD (gastroesophageal reflux disease)   .  Hyperlipidemia   . Joint pain   . Leg pain    With Walking  . PAD (peripheral artery disease) ([place])   . Reflux   . Ulcer ([place])    Foot    Past Surgical History:  Procedure Laterality Date  . ABDOMINAL AORTAGRAM  June 15, 2014  . ABDOMINAL AORTAGRAM N/A 06/15/2014   Procedure: ABDOMINAL Maxcine Ham;  Surgeon: Serafina Mitchell, MD;  Location: [facility] CATH LAB;  Service: Cardiovascular;  Laterality: N/A;  . ABDOMINAL AORTAGRAM N/A 11/22/2014   Procedure: ABDOMINAL AORTAGRAM;  Surgeon: Serafina Mitchell, MD;  Location: [facility] CATH LAB;  Service: Cardiovascular;  Laterality: N/A;  . ARTERIAL BYPASS SURGRY  07/05/2010   Right Common Femoral to below knee popliteal BPG  . BACK SURGERY     X's  2  . CARDIAC CATHETERIZATION    . CHOLECYSTECTOMY     Gall Bladder  . CYSTECTOMY Right    foot  . CYSTECTOMY Left    wrist  . INTERCOSTAL NERVE BLOCK  November 2015  . IRRIGATION AND DEBRIDEMENT BUTTOCKS Right 09/30/2016   Procedure: DEBRIDEMENT RIGHT  BUTTOCK WOUND;  Surgeon: Georganna Skeans, MD;  Location: [place];  Service: General;  Laterality: Right;  . left foot surgery    . left wrist cyst removal Left   . PERIPHERAL VASCULAR CATHETERIZATION N/A 05/07/2016   Procedure: Abdominal Aortogram;  Surgeon: Serafina Mitchell, MD;  Location: [place] CV LAB;  Service: Cardiovascular;  Laterality: N/A;  . PERIPHERAL VASCULAR CATHETERIZATION N/A 05/07/2016   Procedure: Lower Extremity Angiography;  Surgeon: Durene Fruits  Pierre Bali, MD;  Location: [place] CV LAB;  Service: Cardiovascular;  Laterality: N/A;  . PERIPHERAL VASCULAR CATHETERIZATION N/A 05/07/2016   Procedure: Aortic Arch Angiography;  Surgeon: Serafina Mitchell, MD;  Location: [place] CV LAB;  Service: Cardiovascular;  Laterality: N/A;  . PERIPHERAL VASCULAR CATHETERIZATION N/A 05/07/2016   Procedure: Upper Extremity Angiography;  Surgeon: Serafina Mitchell, MD;  Location: [place] CV LAB;  Service: Cardiovascular;  Laterality: N/A;  . PERIPHERAL VASCULAR  CATHETERIZATION Right 05/07/2016   Procedure: Peripheral Vascular Balloon Angioplasty;  Surgeon: Serafina Mitchell, MD;  Location: [place] CV LAB;  Service: Cardiovascular;  Laterality: Right;  subclavian  . PERIPHERAL VASCULAR CATHETERIZATION Right 05/07/2016   Procedure: Peripheral Vascular Intervention;  Surgeon: Serafina Mitchell, MD;  Location: [place] CV LAB;  Service: Cardiovascular;  Laterality: Right;  External  Iliac  . SKIN GRAFT Right 2012   [name] by Dr. Nils Pyle- Right and Left Ankle  . SPINE SURGERY    . TONSILLECTOMY      Family History  Problem Relation Age of Onset  . Coronary artery disease Mother   . Peripheral vascular disease Mother   . Heart disease Mother     Before age 24  . Other Mother     Venous insuffiency  . Diabetes Mother   . Hyperlipidemia Mother   . Hypertension Mother   . Varicose Veins Mother   . Heart attack Mother     before age 98  . Heart disease Father   . Diabetes Father   . Diabetes Maternal Grandmother   . Diabetes Paternal Grandmother   . Diabetes Paternal Grandfather   . Diabetes Sister   . Hypertension Sister   . Diabetes Brother   . Hypertension Brother     Social History:  reports that she has been smoking Cigarettes.  She has a 45.00 pack-year smoking history. She has never used smokeless tobacco. She reports that she does not drink alcohol or use drugs.  Allergies:  Allergies  Allergen Reactions  . Penicillins Other (See Comments)    Severe Headache (not so sure about this now) Has patient had a PCN reaction causing immediate rash, facial/tongue/throat swelling, SOB or lightheadedness with hypotension: No Has patient had a PCN reaction causing severe rash involving mucus membranes or skin necrosis: No Has patient had a PCN reaction that required hospitalization: No Has patient had a PCN reaction occurring within the last 10 years: No If all of the above answers are "NO", then may proceed with Cephalosporin use.      Prior to Admission medications   Medication Sig Start Date End Date Taking? Authorizing Provider  albuterol (PROVENTIL) 2 MG tablet Take 2 mg by mouth 3 (three) times daily as needed for shortness of breath.    Yes Historical Provider, MD  aspirin 81 MG tablet Take 81 mg by mouth daily.    Yes Historical Provider, MD  cetirizine (ZYRTEC) 10 MG tablet Take 10 mg by mouth daily as needed for allergies. Reported on 03/27/2016   Yes Historical Provider, MD  Choline Fenofibrate (FENOFIBRIC ACID) 45 MG CPDR Take 45 mg by mouth daily. Reported on 03/18/2016 10/30/15  Yes Historical Provider, MD  cyclobenzaprine (FLEXERIL) 10 MG tablet Limit 1 tablet by mouth 1  to  3 times per day if tolerated Patient taking differently: Take 10 mg by mouth 3 (three) times daily as needed for muscle spasms.  07/18/16  Yes Mohammed Kindle, MD  dicyclomine (BENTYL) 10 MG capsule  Take 10 mg by mouth 4 (four) times daily as needed for spasms. 08/08/16 08/08/17 Yes Historical Provider, MD  furosemide (LASIX) 40 MG tablet Take 40 mg by mouth 2 (two) times daily as needed for fluid.    Yes Historical Provider, MD  HUMALOG KWIKPEN 100 UNIT/ML KiwkPen Inject 10 Units into the skin 3 (three) times daily. Per sliding scale 11/11/15  Yes Historical Provider, MD  HYDROcodone-acetaminophen (NORCO) 10-325 MG tablet Limit 1 tablet by mouth per day or twice per day if tolerated Patient taking differently: Take 1 tablet by mouth See admin instructions. Every 8-12 hours 07/18/16  Yes Mohammed Kindle, MD  LANTUS SOLOSTAR 100 UNIT/ML Solostar Pen Inject 36 Units into the skin 2 (two) times daily.  09/15/14  Yes Historical Provider, MD  meclizine (ANTIVERT) 25 MG tablet Take 25 mg by mouth 3 (three) times daily as needed for dizziness. 11/06/16  Yes Historical Provider, MD  omeprazole (PRILOSEC) 40 MG capsule Take 40 mg by mouth daily. 11/06/16  Yes Historical Provider, MD  pregabalin (LYRICA) 100 MG capsule Limit 1 tab by mouth twice a day to 3  times a day if tolerated Patient taking differently: Take 100 mg by mouth 3 (three) times daily.  07/18/16  Yes Mohammed Kindle, MD  pseudoephedrine-guaifenesin [facility] D) 60-600 MG per tablet Take 1 tablet by mouth every 12 (twelve) hours as needed for congestion. Reported on 04/08/2016   Yes Historical Provider, MD     Scheduled: . sodium chloride   Intravenous STAT  . aspirin EC  81 mg Oral Daily  . cefTAZidime (FORTAZ)  IV  1 g Intravenous Q8H  . enoxaparin (LOVENOX) injection  40 mg Subcutaneous Daily  . feeding supplement (GLUCERNA SHAKE)  237 mL Oral TID BM  . fenofibrate  54 mg Oral Daily  . fluconazole  100 mg Oral Daily  . fludrocortisone  0.1 mg Oral Daily  . hydrocortisone sod succinate (SOLU-CORTEF) inj  50 mg Intravenous Q6H  . insulin aspart  0-15 Units Subcutaneous TID WC  . insulin aspart  0-5 Units Subcutaneous QHS  . insulin aspart  10 Units Subcutaneous Once  . insulin glargine  36 Units Subcutaneous BID  . LORazepam  1 mg Intravenous Once  . mouth rinse  15 mL Mouth Rinse BID  . midodrine  10 mg Oral TID WC  . multivitamin with minerals  1 tablet Oral Daily  . pantoprazole  80 mg Oral Daily  . [START ON 12/25/2016] pneumococcal 23 valent vaccine  0.5 mL Intramuscular Tomorrow-1000  . pregabalin  100 mg Oral TID  . sodium chloride flush  3 mL Intravenous Q12H  . vancomycin  500 mg Intravenous Q12H   Continuous: . sodium chloride 125 mL/hr at 12/24/16 1147  . phenylephrine (NEO-SYNEPHRINE) Adult infusion Stopped (12/24/16 0728)   OFB:PZWCHENIDPOEU **OR** acetaminophen, cyclobenzaprine, dicyclomine, HYDROcodone-acetaminophen, morphine injection, ondansetron **OR** ondansetron (ZOFRAN) IV Anti-infectives    Start     Dose/Rate Route Frequency Ordered Stop   12/24/16 1300  cefTAZidime (FORTAZ) 1 g in dextrose 5 % 50 mL IVPB     1 g 100 mL/hr over 30 Minutes Intravenous Every 8 hours 12/24/16 1122     12/24/16 1130  fluconazole (DIFLUCAN) tablet 100 mg     100 mg  Oral Daily 12/24/16 1129 12/27/16 0959   12/24/16 1000  vancomycin (VANCOCIN) 500 mg in sodium chloride 0.9 % 100 mL IVPB     500 mg 100 mL/hr over 60 Minutes Intravenous Every 12 hours 12/24/16 0306  12/24/16 0830  vancomycin (VANCOCIN) 500 mg in sodium chloride 0.9 % 100 mL IVPB  Status:  Discontinued     500 mg 100 mL/hr over 60 Minutes Intravenous Every 12 hours 12/23/16 1959 12/24/16 0250   12/24/16 0600  ceFEPIme (MAXIPIME) 1 g in dextrose 5 % 50 mL IVPB  Status:  Discontinued     1 g 100 mL/hr over 30 Minutes Intravenous Every 8 hours 12/24/16 0305 12/24/16 1122   12/24/16 0430  ceFEPIme (MAXIPIME) 1 g in dextrose 5 % 50 mL IVPB  Status:  Discontinued     1 g 100 mL/hr over 30 Minutes Intravenous Every 8 hours 12/23/16 1959 12/24/16 0250   12/24/16 0400  metroNIDAZOLE (FLAGYL) tablet 500 mg  Status:  Discontinued     500 mg Oral Every 8 hours 12/24/16 0250 12/24/16 1122   12/24/16 0015  fluconazole (DIFLUCAN) IVPB 100 mg  Status:  Discontinued     100 mg 50 mL/hr over 60 Minutes Intravenous Every 24 hours 12/24/16 0007 12/24/16 1129   12/23/16 1945  ceFEPIme (MAXIPIME) 2 g in dextrose 5 % 50 mL IVPB     2 g 100 mL/hr over 30 Minutes Intravenous  Once 12/23/16 1941 12/24/16 0130   12/23/16 1945  vancomycin (VANCOCIN) IVPB 1000 mg/200 mL premix     1,000 mg 200 mL/hr over 60 Minutes Intravenous  Once 12/23/16 1941 12/23/16 2225      Results for orders placed or performed during the hospital encounter of 12/23/16 (from the past 48 hour(s))  Comprehensive metabolic panel     Status: Abnormal   Collection Time: 12/23/16  6:55 PM  Result Value Ref Range   Sodium 130 (L) 135 - 145 mmol/L   Potassium 3.7 3.5 - 5.1 mmol/L   Chloride 84 (L) 101 - 111 mmol/L   CO2 30 22 - 32 mmol/L   Glucose, Bld 357 (H) 65 - 99 mg/dL   BUN 25 (H) 6 - 20 mg/dL   Creatinine, Ser 1.10 (H) 0.44 - 1.00 mg/dL   Calcium 9.9 8.9 - 10.3 mg/dL   Total Protein 7.5 6.5 - 8.1 g/dL   Albumin 3.0 (L) 3.5 -  5.0 g/dL   AST 14 (L) 15 - 41 U/L   ALT 8 (L) 14 - 54 U/L   Alkaline Phosphatase 85 38 - 126 U/L   Total Bilirubin 0.4 0.3 - 1.2 mg/dL   GFR calc non Af Amer 57 (L) >60 mL/min   GFR calc Af Amer >60 >60 mL/min    Comment: (NOTE) The eGFR has been calculated using the CKD EPI equation. This calculation has not been validated in all clinical situations. eGFR's persistently <60 mL/min signify possible Chronic Kidney Disease.    Anion gap 16 (H) 5 - 15  CBC with Differential     Status: Abnormal   Collection Time: 12/23/16  6:55 PM  Result Value Ref Range   WBC 16.9 (H) 4.0 - 10.5 K/uL   RBC 4.94 3.87 - 5.11 MIL/uL   Hemoglobin 13.2 12.0 - 15.0 g/dL   HCT 39.9 36.0 - 46.0 %   MCV 80.8 78.0 - 100.0 fL   MCH 26.7 26.0 - 34.0 pg   MCHC 33.1 30.0 - 36.0 g/dL   RDW 14.5 11.5 - 15.5 %   Platelets 440 (H) 150 - 400 K/uL   Neutrophils Relative % 73 %   Neutro Abs 12.4 (H) 1.7 - 7.7 K/uL   Lymphocytes Relative 19 %   Lymphs Abs  3.2 0.7 - 4.0 K/uL   Monocytes Relative 6 %   Monocytes Absolute 0.9 0.1 - 1.0 K/uL   Eosinophils Relative 2 %   Eosinophils Absolute 0.3 0.0 - 0.7 K/uL   Basophils Relative 0 %   Basophils Absolute 0.0 0.0 - 0.1 K/uL  Protime-INR     Status: None   Collection Time: 12/23/16  6:55 PM  Result Value Ref Range   Prothrombin Time 13.8 11.4 - 15.2 seconds   INR 1.06   I-Stat CG4 Lactic Acid, ED     Status: Abnormal   Collection Time: 12/23/16  7:14 PM  Result Value Ref Range   Lactic Acid, Venous 4.06 (HH) 0.5 - 1.9 mmol/L   Comment NOTIFIED PHYSICIAN   I-Stat arterial blood gas, ED     Status: Abnormal   Collection Time: 12/23/16 10:08 PM  Result Value Ref Range   pH, Arterial 7.396 7.350 - 7.450   pCO2 arterial 48.1 (H) 32.0 - 48.0 mmHg   pO2, Arterial 63.0 (L) 83.0 - 108.0 mmHg   Bicarbonate 29.6 (H) 20.0 - 28.0 mmol/L   TCO2 31 0 - 100 mmol/L   O2 Saturation 91.0 %   Acid-Base Excess 4.0 (H) 0.0 - 2.0 mmol/L   Patient temperature 98.6 F    Collection  site RADIAL, ALLEN'S TEST ACCEPTABLE    Drawn by RT    Sample type ARTERIAL   Urinalysis, Routine w reflex microscopic     Status: Abnormal   Collection Time: 12/23/16 10:21 PM  Result Value Ref Range   Color, Urine STRAW (A) YELLOW   APPearance CLEAR CLEAR   Specific Gravity, Urine 1.002 (L) 1.005 - 1.030   pH 6.0 5.0 - 8.0   Glucose, UA >=500 (A) NEGATIVE mg/dL   Hgb urine dipstick NEGATIVE NEGATIVE   Bilirubin Urine NEGATIVE NEGATIVE   Ketones, ur NEGATIVE NEGATIVE mg/dL   Protein, ur NEGATIVE NEGATIVE mg/dL   Nitrite NEGATIVE NEGATIVE   Leukocytes, UA NEGATIVE NEGATIVE   RBC / HPF 0-5 0 - 5 RBC/hpf   WBC, UA 0-5 0 - 5 WBC/hpf   Bacteria, UA FEW (A) NONE SEEN   Squamous Epithelial / LPF 0-5 (A) NONE SEEN  Urine rapid drug screen (hosp performed)     Status: Abnormal   Collection Time: 12/23/16 10:21 PM  Result Value Ref Range   Opiates POSITIVE (A) NONE DETECTED   Cocaine NONE DETECTED NONE DETECTED   Benzodiazepines NONE DETECTED NONE DETECTED   Amphetamines NONE DETECTED NONE DETECTED   Tetrahydrocannabinol NONE DETECTED NONE DETECTED   Barbiturates NONE DETECTED NONE DETECTED    Comment:        DRUG SCREEN FOR MEDICAL PURPOSES ONLY.  IF CONFIRMATION IS NEEDED FOR ANY PURPOSE, NOTIFY LAB WITHIN 5 DAYS.        LOWEST DETECTABLE LIMITS FOR URINE DRUG SCREEN Drug Class       Cutoff (ng/mL) Amphetamine      1000 Barbiturate      200 Benzodiazepine   481 Tricyclics       856 Opiates          300 Cocaine          300 THC              50   D-dimer, quantitative (not at [facility])     Status: Abnormal   Collection Time: 12/23/16 11:20 PM  Result Value Ref Range   D-Dimer, Quant 0.72 (H) 0.00 - 0.50 ug/mL-FEU    Comment: (  NOTE) At the manufacturer cut-off of 0.50 ug/mL FEU, this assay has been documented to exclude PE with a sensitivity and negative predictive value of 97 to 99%.  At this time, this assay has not been approved by the FDA to exclude DVT/VTE. Results  should be correlated with clinical presentation.   I-Stat CG4 Lactic Acid, ED     Status: Abnormal   Collection Time: 12/23/16 11:29 PM  Result Value Ref Range   Lactic Acid, Venous 2.01 (HH) 0.5 - 1.9 mmol/L   Comment NOTIFIED PHYSICIAN   Lactic acid, plasma     Status: Abnormal   Collection Time: 12/24/16  1:54 AM  Result Value Ref Range   Lactic Acid, Venous 2.0 (HH) 0.5 - 1.9 mmol/L    Comment: CRITICAL RESULT CALLED TO, READ BACK BY AND VERIFIED WITH: WHITE S,RN 12/24/16 0235 WAYK   Cortisol     Status: None   Collection Time: 12/24/16  1:54 AM  Result Value Ref Range   Cortisol, Plasma 11.2 ug/dL    Comment: (NOTE) AM    6.7 - 22.6 ug/dL PM   <10.0       ug/dL   Glucose, capillary     Status: Abnormal   Collection Time: 12/24/16  2:37 AM  Result Value Ref Range   Glucose-Capillary 328 (H) 65 - 99 mg/dL   Comment 1 Notify RN   HIV antibody     Status: None   Collection Time: 12/24/16  4:07 AM  Result Value Ref Range   HIV Screen 4th Generation wRfx Non Reactive Non Reactive    Comment: (NOTE) Performed At: [facility] [place], [place] [id number] Lindon Romp MD [id number]   Sedimentation rate     Status: Abnormal   Collection Time: 12/24/16  4:07 AM  Result Value Ref Range   Sed Rate 78 (H) 0 - 22 mm/hr  C-reactive protein     Status: Abnormal   Collection Time: 12/24/16  4:07 AM  Result Value Ref Range   CRP 5.4 (H) <1.0 mg/dL  Prealbumin     Status: Abnormal   Collection Time: 12/24/16  4:07 AM  Result Value Ref Range   Prealbumin 12.4 (L) 18 - 38 mg/dL  Lactic acid, plasma     Status: None   Collection Time: 12/24/16  4:07 AM  Result Value Ref Range   Lactic Acid, Venous 1.2 0.5 - 1.9 mmol/L  Procalcitonin     Status: None   Collection Time: 12/24/16  4:07 AM  Result Value Ref Range   Procalcitonin 0.13 ng/mL    Comment:        Interpretation: PCT (Procalcitonin) <= 0.5 ng/mL: Systemic infection (sepsis) is not  likely. Local bacterial infection is possible. (NOTE)         ICU PCT Algorithm               Non ICU PCT Algorithm    ----------------------------     ------------------------------         PCT < 0.25 ng/mL                 PCT < 0.1 ng/mL     Stopping of antibiotics            Stopping of antibiotics       strongly encouraged.               strongly encouraged.    ----------------------------     ------------------------------  PCT level decrease by               PCT < 0.25 ng/mL       >= 80% from peak PCT       OR PCT 0.25 - 0.5 ng/mL          Stopping of antibiotics                                             encouraged.     Stopping of antibiotics           encouraged.    ----------------------------     ------------------------------       PCT level decrease by              PCT >= 0.25 ng/mL       < 80% from peak PCT        AND PCT >= 0.5 ng/mL            Continuin g antibiotics                                              encouraged.       Continuing antibiotics            encouraged.    ----------------------------     ------------------------------     PCT level increase compared          PCT > 0.5 ng/mL         with peak PCT AND          PCT >= 0.5 ng/mL             Escalation of antibiotics                                          strongly encouraged.      Escalation of antibiotics        strongly encouraged.   Basic metabolic panel     Status: Abnormal   Collection Time: 12/24/16  4:08 AM  Result Value Ref Range   Sodium 138 135 - 145 mmol/L    Comment: DELTA CHECK NOTED   Potassium 3.2 (L) 3.5 - 5.1 mmol/L   Chloride 99 (L) 101 - 111 mmol/L   CO2 28 22 - 32 mmol/L   Glucose, Bld 344 (H) 65 - 99 mg/dL   BUN 15 6 - 20 mg/dL   Creatinine, Ser 0.75 0.44 - 1.00 mg/dL   Calcium 8.6 (L) 8.9 - 10.3 mg/dL   GFR calc non Af Amer >60 >60 mL/min   GFR calc Af Amer >60 >60 mL/min    Comment: (NOTE) The eGFR has been calculated using the CKD EPI equation. This  calculation has not been validated in all clinical situations. eGFR's persistently <60 mL/min signify possible Chronic Kidney Disease.    Anion gap 11 5 - 15  CBC     Status: Abnormal   Collection Time: 12/24/16  4:08 AM  Result Value Ref Range   WBC 13.2 (H) 4.0 - 10.5 K/uL   RBC 4.03 3.87 - 5.11 MIL/uL   Hemoglobin 10.4 (L) 12.0 - 15.0 g/dL  Comment: DELTA CHECK NOTED REPEATED TO VERIFY    HCT 32.7 (L) 36.0 - 46.0 %   MCV 81.1 78.0 - 100.0 fL   MCH 25.8 (L) 26.0 - 34.0 pg   MCHC 31.8 30.0 - 36.0 g/dL   RDW 14.5 11.5 - 15.5 %   Platelets 399 150 - 400 K/uL  MRSA PCR Screening     Status: None   Collection Time: 12/24/16  4:19 AM  Result Value Ref Range   MRSA by PCR NEGATIVE NEGATIVE    Comment:        The GeneXpert MRSA Assay (FDA approved for NASAL specimens only), is one component of a comprehensive MRSA colonization surveillance program. It is not intended to diagnose MRSA infection nor to guide or monitor treatment for MRSA infections.   Lactic acid, plasma     Status: None   Collection Time: 12/24/16 11:39 AM  Result Value Ref Range   Lactic Acid, Venous 0.9 0.5 - 1.9 mmol/L  Glucose, capillary     Status: Abnormal   Collection Time: 12/24/16 12:25 PM  Result Value Ref Range   Glucose-Capillary 193 (H) 65 - 99 mg/dL    Mr Ankle Right W Wo Contrast  Result Date: 12/24/2016 CLINICAL DATA:  Lateral right ankle wound of unknown duration. Sepsis. Question abscess or osteomyelitis. EXAM: MRI OF THE RIGHT ANKLE WITHOUT AND WITH CONTRAST TECHNIQUE: Multiplanar, multisequence MR imaging of the ankle was performed before and after the administration of intravenous contrast. CONTRAST:  13 ml MULTIHANCE GADOBENATE DIMEGLUMINE 529 MG/ML IV SOLN COMPARISON:  Plain films right ankle 10/03/2016 FINDINGS: Soft tissue wound is seen over the lateral malleolus. There is subcutaneous edema and enhancement about the lateral aspect of the ankle compatible cellulitis. No abscess.  TENDONS Peroneal: Intact. Posteromedial: Intact. Anterior: Intact. Achilles: Mild thickening of the distal tendon is compatible tendinopathy without tear. Plantar Fascia: Intact. LIGAMENTS Lateral: Intact. Medial: Intact. CARTILAGE Ankle Joint: No effusion or osteochondral lesion. Subtalar Joints/Sinus Tarsi: Negative. Bones: Intense edema and enhancement are seen in the distal 4.5-5 cm of the fibula consistent with osteomyelitis. Other: None. IMPRESSION: IMPRESSION Cellulitis about the lateral ankle with a soft tissue wound overlying the lateral malleolus. Edema and enhancement in the distal 4.5-5 cm of the fibula are consistent with osteomyelitis. Negative for abscess. Mild appearing Achilles tendinopathy without tear. Electronically Signed   By: Inge Rise M.D.   On: 12/24/2016 11:49   Dg Chest Portable 1 View  Result Date: 12/23/2016 CLINICAL DATA:  Hypotension. EXAM: PORTABLE CHEST 1 VIEW COMPARISON:  11/26/2016, 10/01/2016 and earlier. FINDINGS: Cardiac silhouette upper normal in size for the AP portable technique, unchanged. Thoracic aorta mildly atherosclerotic, unchanged. Hilar and mediastinal contours otherwise unremarkable. Mildly prominent bronchovascular markings diffusely, unchanged. No new pulmonary parenchymal abnormalities. No visible pleural effusions. IMPRESSION: Stable mild changes of chronic bronchitis and/or asthma. No acute cardiopulmonary disease. Electronically Signed   By: Evangeline Dakin M.D.   On: 12/23/2016 19:06    Review of Systems  Constitutional: Negative.   HENT: Negative.   Eyes: Negative.   Respiratory: Negative.        Still smoking  Cardiovascular: Negative.   Gastrointestinal: Negative.   Genitourinary: Negative.   Musculoskeletal: Positive for back pain, joint pain and myalgias.  Skin: Negative.   Neurological: Negative.   Endo/Heme/Allergies: Negative.   Psychiatric/Behavioral: Positive for depression. The patient is nervous/anxious.        Chronic  pain   Blood pressure 115/65, pulse 89, temperature 97.9 F (36.6  C), temperature source Oral, resp. rate 17, height '5\' 6"'  (1.676 m), weight 65.6 kg (144 lb 10 oz), SpO2 96 %. Physical Exam  Constitutional: She is oriented to person, place, and time.  Thin female in no acute distress.  Wound vac changed to our in hospital system.   Picture below  HENT:  Head: Normocephalic and atraumatic.  Nose: Nose normal.  Mouth/Throat: No oropharyngeal exudate.  Eyes: Right eye exhibits no discharge. Left eye exhibits no discharge. No scleral icterus.  Neck: Normal range of motion. Neck supple. No JVD present. No tracheal deviation present. No thyromegaly present.  Cardiovascular: Normal rate, regular rhythm and normal heart sounds.   No distal pulses  Respiratory: Effort normal and breath sounds normal. No respiratory distress. She has no wheezes. She has no rales. She exhibits no tenderness.  GI: Soft. Bowel sounds are normal. She exhibits no distension and no mass. There is no tenderness. There is no rebound and no guarding.  Musculoskeletal: She exhibits no edema.  Lymphadenopathy:    She has no cervical adenopathy.  Neurological: She is alert and oriented to person, place, and time. No cranial nerve deficit.  Skin: Skin is warm and dry. No rash noted. No erythema. No pallor.  Psychiatric: She has a normal mood and affect. Her behavior is normal. Judgment and thought content normal.     This is the wound today this was changed and taken by [facility].  Wound vac was back in place before I got to room.  Wound care was unaware I had called and ask them to allow me to see it.     Assessment/Plan: Hypotension with possible sepsis Hx of necrotizing fasciitis of the right buttocks; status post debridement 09/30/16, Dr. Georganna Skeans Chronic back and neck pain - chronic pain management with oxycodone, she does not know dose. Type 2 diabetes Asthma Ongoing tobacco use started age  34 PAD/status post right fem-tib below-knee popliteal bypass grafting  Pictures shows marked improvement over the interim period since her last admission. I do not think this is the source of her low BP.  Will review and see the wound on Thrusday if she is still here.  Continue wound care with [facility] in [place] with Dr. Nils Pyle on discharge.     Shimika Ames 12/24/2016, 2:02 PM

## 2016-12-24 NOTE — Consult Note (Signed)
[place] Nurse wound consult note Patient well known to this [facility] nurse from her previous admissions.  Followed by wound care center in [facility] for chronic right malleolar wound and she recently has had right buttock wound from necrotizing fascitis, debridement in the OR last November per CCS.  See images in the chart for ED visit in January 2018 Reason for Consult: foot wound, of note has NPWT as well Wound type: Right buttock wound with NPWT, Medela pump from [facility]  Chronic right malleolar wound Right pretibial injury with superficial partial thickness wound  Pressure Injury POA: No Measurement: Right malleolar: 1.5cm x 2.0cm x 0.3cm with undermining circumferentially that is only 0.1 cm Right pretibial: 1cm x 30mc x 0.1cm  Right buttock: 8cm x 6cm x 2cm  Wound bed: Right malleolar: pale, 50% yellow/50% non granular, bone palpable Right pretibial clean, 100% pink and moist Right buttock: 90% pink, early granulation tissue;10% yellow/grey loose non viable tissue. CSWD performed at the bedside; removed as much of the loose slough that could be mobilized and trimmed within patient tolerance. Drainage (amount, consistency, odor) scant right pretibial; the ankle wound has thicker yellow drainage but no odor; right buttock scant, serosanguinous in the canister of the NPWT pump Periwound: Right malleolar: chronic, severe epibole Right pretibial; intact Right buttock: intact  Dressing procedure/placement/frequency:  See ABI results, indicates arterial disease, will need revascularization if wound healing is the goal.   RIGHT LEFT  ABI 0.45 0.89   Silver hydrofiber and foam to the right malleolus until vascular workup for exudate management and bioburdan Silicone foam to the right pretibial wound, change every 3 days. WOC will change NPWT dressing today, her home pump is not compatible to our NPWT device, so it will require me to change the dressing and connect her to hospital issued pump.  [place], [place]

## 2016-12-24 NOTE — Progress Notes (Signed)
[facility] ADULT ICU REPLACEMENT PROTOCOL FOR AM LAB REPLACEMENT ONLY  The patient does apply for the [facility] Adult ICU Electrolyte Replacment Protocol based on the criteria listed below:   1. Is GFR >/= 40 ml/min? Yes.    Patient's GFR today is >60 2. Is urine output >/= 0.5 ml/kg/hr for the last 6 hours? Yes.   Patient's UOP is 1.52 ml/kg/hr 3. Is BUN < 60 mg/dL? Yes.    Patient's BUN today is 15 4. Abnormal electrolyte K 3.2 5. Ordered repletion with: per protocol 6. If a panic level lab has been reported, has the CCM MD in charge been notified? Yes.  .   Physician:  Rosalia Hammers 12/24/2016 5:53 AM

## 2016-12-24 NOTE — Care Management Note (Addendum)
Case Management Note  Patient Details  Name: [name] MRN: [id number] Date of Birth: Oct 08, 1966  Subjective/Objective:   Pt admitted with hypotention        Action/Plan:  PTA from home with husband.  Pt discharged last time with [facility]) and wound vac ([employer]).  Pt stays with husband and continues to have sedentary lifestyle.  Latest wound vac placed by [facility] 12/21/16.  Agency aware of admit.  Pt was previously recommended for SNF however refused.  CM requested PT eval via bedside nurse, resumption orders requested via physician sticky tab.  Pt denied barriers to obtaining medications.  CM will continue to follow for discharge needs   Expected Discharge Date:                  Expected Discharge Plan:  [place]  In-House Referral:     Discharge planning Services  CM Consult  Post Acute Care Choice:    Choice offered to:  Patient  DME Arranged:    DME Agency:     HH Arranged:  RN [place] Agency:  [place]  Status of Service:  In process, will continue to follow  If discussed at Long Length of Stay Meetings, dates discussed:    Additional Comments:  [name], RN 12/24/2016, 3:40 PM

## 2016-12-24 NOTE — Progress Notes (Signed)
CRITICAL VALUE ALERT  Critical value received: Lactic Acid 2.0   Date of notification:  12/24/2016  Time of notification:  0300  Critical value read back:.Yes  Nurse who received alert:  Lonn Georgia RN  MD notified (1st page):  Mannam  Time of first page:    MD notified (2nd page):  Time of second page:  Responding MD:  Mannam  Time MD responded: [phone]

## 2016-12-24 NOTE — Progress Notes (Signed)
CSW consult acknowledged re "access meds at discharge". CSW has deferred to RN Case Manager for all medication assistance needs. CSW signing off. Please consult should new need(s) arise.    Lorrine Kin, MSW, LCSW [facility] ED/35M Clinical Social Worker [phone]

## 2016-12-24 NOTE — Progress Notes (Signed)
Patient's BP again dropped to 66/49 despite 4L IVF and improving lactate.  As such, I called and spoke with Dr. Vaughan Browner and requested that PCCM formally consult on the patient.  He has agreed to see her.  Carlyon Shadow, M.D.

## 2016-12-24 NOTE — Progress Notes (Signed)
NURSING PROGRESS NOTE  [name] [id number] Transfer Data: 12/24/2016 7:38 PM Attending Provider: Juanito Doom, MD NOI:[id number],GQBVQ M, MD Code Status: Full  [name] is a 51 y.o. female patient transferred from Stepdown -No acute distress noted.  -No complaints of shortness of breath.  -No complaints of chest pain.   Cardiac Monitoring: Box #6 in place.  Blood pressure (!) 88/48, pulse 87, temperature 97.5 F (36.4 C), temperature source Oral, resp. rate 18, height 5\' 6"  (1.676 m), weight 65.6 kg (144 lb 10 oz), SpO2 98 %.   IV Fluids:  IV in place, occlusive dsg intact without redness, IV cath Rt fore arm NS @ 125cc hr   Allergies:  Penicillins  Past Medical History:   has a past medical history of Arthritis; Asthma; Chronic back pain; Depression; Diabetes mellitus; Diabetic neuropathy ([place]); Family history of adverse reaction to anesthesia; GERD (gastroesophageal reflux disease); Hyperlipidemia; Joint pain; Leg pain; PAD (peripheral artery disease) ([place]); Reflux; and Ulcer ([place]).  Past Surgical History:   has a past surgical history that includes Arterial bypass surgry (07/05/2010); Skin graft (Right, 2012); Back surgery; Tonsillectomy; Spine surgery; Cholecystectomy; Abdominal aortagram (June 15, 2014); Intercostal nerve block (November 2015); abdominal aortagram (N/A, 06/15/2014); abdominal aortagram (N/A, 11/22/2014); Cystectomy (Right); Cystectomy (Left); left foot surgery; left wrist cyst removal (Left); Cardiac catheterization (N/A, 05/07/2016); Cardiac catheterization (N/A, 05/07/2016); Cardiac catheterization (N/A, 05/07/2016); Cardiac catheterization (N/A, 05/07/2016); Cardiac catheterization (Right, 05/07/2016); Cardiac catheterization (Right, 05/07/2016); Cardiac catheterization; and Irrigation and debridement buttocks (Right, 09/30/2016).  Social History:   reports that she has been smoking Cigarettes.  She has a 45.00 pack-year smoking history. She has never used  smokeless tobacco. She reports that she does not drink alcohol or use drugs.  Skin: Sacral ulcer softball size with wound vac applied, R ankle has Aquacel & foam applied  Patient/Family orientated to room. Information packet given to patient/family. Admission inpatient armband information verified with patient/family to include name and date of birth and placed on patient arm. Side rails up x 2, fall assessment and education completed with patient/family. Patient/family able to verbalize understanding of risk associated with falls and verbalized understanding to call for assistance before getting out of bed. Call light within reach. Patient/family able to voice and demonstrate understanding of unit orientation instructions.    Will continue to evaluate and treat per MD orders.

## 2016-12-24 NOTE — Consult Note (Signed)
PULMONARY / CRITICAL CARE MEDICINE   Name: [name] MRN: [id number] DOB: 05/15/1966    ADMISSION DATE:  12/23/2016 CONSULTATION DATE:  12/24/16  REFERRING MD:  Lorin Mercy - TR[facility]  CHIEF COMPLAINT:  Hypotension  HISTORY OF PRESENT ILLNESS:  [name] is a 50 y.o. female with PMH as outlined below. She was sent to [facility] ED 01/05 from her pain clinic where she was found to have hypotension.  She is followed in pain clinic due to pain from wound vac to right buttock.  She has not had any recent fevers/chills/sweats, headaches, chest pain, SOB, N/V/D, abd pain, myalgias.  She has a wound to right lateral malleolus but this has not any new drainage recently.  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  Had wound vac then but was removed.  Had wound vac replaced 1 week ago.  In ED, she was found to have SBP in 60's.  She states that her normal SBP is in the 80's - 90's.  She has had MAP's in mid 17's while in ED and has continued to have good mental status.  PAST MEDICAL HISTORY :  She  has a past medical history of Arthritis; Asthma; Chronic back pain; Depression; Diabetes mellitus; Diabetic neuropathy ([place]); Family history of adverse reaction to anesthesia; GERD (gastroesophageal reflux disease); Hyperlipidemia; Joint pain; Leg pain; PAD (peripheral artery disease) ([place]); Reflux; and Ulcer ([place]).  PAST SURGICAL HISTORY: She  has a past surgical history that includes Arterial bypass surgry (07/05/2010); Skin graft (Right, 2012); Back surgery; Tonsillectomy; Spine surgery; Cholecystectomy; Abdominal aortagram (June 15, 2014); Intercostal nerve block (November 2015); abdominal aortagram (N/A, 06/15/2014); abdominal aortagram (N/A, 11/22/2014); Cystectomy (Right); Cystectomy (Left); left foot surgery; left wrist cyst removal (Left); Cardiac catheterization (N/A, 05/07/2016); Cardiac catheterization (N/A, 05/07/2016); Cardiac catheterization (N/A, 05/07/2016); Cardiac catheterization (N/A, 05/07/2016);  Cardiac catheterization (Right, 05/07/2016); Cardiac catheterization (Right, 05/07/2016); Cardiac catheterization; and Irrigation and debridement buttocks (Right, 09/30/2016).  Allergies  Allergen Reactions  . Penicillins Other (See Comments)    Severe Headache (not so sure about this now) Has patient had a PCN reaction causing immediate rash, facial/tongue/throat swelling, SOB or lightheadedness with hypotension: No Has patient had a PCN reaction causing severe rash involving mucus membranes or skin necrosis: No Has patient had a PCN reaction that required hospitalization: No Has patient had a PCN reaction occurring within the last 10 years: No If all of the above answers are "NO", then may proceed with Cephalosporin use.     Current Facility-Administered Medications on File Prior to Encounter  Medication  . 0.9 %  sodium chloride infusion   Current Outpatient Prescriptions on File Prior to Encounter  Medication Sig  . albuterol (PROVENTIL) 2 MG tablet Take 2 mg by mouth 3 (three) times daily as needed for shortness of breath.   Marland Kitchen aspirin 81 MG tablet Take 81 mg by mouth daily.   . cetirizine (ZYRTEC) 10 MG tablet Take 10 mg by mouth daily as needed for allergies. Reported on 03/27/2016  . Choline Fenofibrate (FENOFIBRIC ACID) 45 MG CPDR Take 45 mg by mouth daily. Reported on 03/18/2016  . cyclobenzaprine (FLEXERIL) 10 MG tablet Limit 1 tablet by mouth 1  to  3 times per day if tolerated (Patient taking differently: Take 10 mg by mouth 3 (three) times daily as needed for muscle spasms. )  . dicyclomine (BENTYL) 10 MG capsule Take 10 mg by mouth 4 (four) times daily as needed for spasms.  . furosemide (  LASIX) 40 MG tablet Take 40 mg by mouth 2 (two) times daily as needed for fluid.   Marland Kitchen HUMALOG KWIKPEN 100 UNIT/ML KiwkPen Inject 10 Units into the skin 3 (three) times daily. Per sliding scale  . HYDROcodone-acetaminophen (NORCO) 10-325 MG tablet Limit 1 tablet by mouth per day or twice per day  if tolerated (Patient taking differently: Take 1 tablet by mouth See admin instructions. Every 8-12 hours)  . LANTUS SOLOSTAR 100 UNIT/ML Solostar Pen Inject 36 Units into the skin 2 (two) times daily.   Marland Kitchen omeprazole (PRILOSEC) 40 MG capsule Take 40 mg by mouth daily.  . pregabalin (LYRICA) 100 MG capsule Limit 1 tab by mouth twice a day to 3 times a day if tolerated (Patient taking differently: Take 100 mg by mouth 3 (three) times daily. )  . pseudoephedrine-guaifenesin (MUCINEX D) 60-600 MG per tablet Take 1 tablet by mouth every 12 (twelve) hours as needed for congestion. Reported on 04/08/2016    FAMILY HISTORY:  Her indicated that her mother is deceased. She indicated that her father is deceased. She indicated that the status of her sister is unknown. She indicated that the status of her brother is unknown. She indicated that her maternal grandmother is deceased. She indicated that her paternal grandmother is deceased. She indicated that her paternal grandfather is deceased.    SOCIAL HISTORY: She  reports that she has been smoking Cigarettes.  She has a 45.00 pack-year smoking history. She has never used smokeless tobacco. She reports that she does not drink alcohol or use drugs.  REVIEW OF SYSTEMS:   All negative; except for those that are bolded, which indicate positives.  Constitutional: weight loss, weight gain, night sweats, fevers, chills, fatigue, weakness.  HEENT: headaches, sore throat, sneezing, nasal congestion, post nasal drip, difficulty swallowing, tooth/dental problems, visual complaints, visual changes, ear aches. Neuro: difficulty with speech, weakness, numbness, ataxia. CV:  chest pain, orthopnea, PND, swelling in lower extremities, dizziness, palpitations, syncope.  Resp: cough, hemoptysis, dyspnea, wheezing. GI: heartburn, indigestion, abdominal pain, nausea, vomiting, diarrhea, constipation, change in bowel habits, loss of appetite, hematemesis, melena, hematochezia.   GU: dysuria, change in color of urine, urgency or frequency, flank pain, hematuria. MSK: joint pain or swelling, decreased range of motion. Psych: change in mood or affect, depression, anxiety, suicidal ideations, homicidal ideations. Skin: rash, itching, bruising, right buttock wound with wound vac in place, right malleolus wound without drainage.   SUBJECTIVE:  A&O x 3. States normal SBP in 80's and 90's.  VITAL SIGNS: BP 94/59   Pulse 110   Temp 98.4 F (36.9 C) (Oral)   Resp 24   SpO2 97%   HEMODYNAMICS:    VENTILATOR SETTINGS:    INTAKE / OUTPUT: No intake/output data recorded.   PHYSICAL EXAMINATION: General: Middle aged female, in NAD. Neuro: A&O x 3, non-focal.  HEENT: [place]/AT. PERRL, sclerae anicteric.  MM very dry. Cardiovascular: RRR, no M/R/G.  Lungs: Respirations even and unlabored.  CTA bilaterally, No W/R/R. Abdomen: BS x 4, soft, NT/ND.  Musculoskeletal: No edema.  Skin: Right malleolus wound with no drainage noted, no surrounding erythema.  Right buttock wound with wound vac in place.  LABS:  BMET  Recent Labs Lab 12/23/16 1855  NA 130*  K 3.7  CL 84*  CO2 30  BUN 25*  CREATININE 1.10*  GLUCOSE 357*    Electrolytes  Recent Labs Lab 12/23/16 1855  CALCIUM 9.9    CBC  Recent Labs Lab 12/23/16 1855  WBC  16.9*  HGB 13.2  HCT 39.9  PLT 440*    Coag's  Recent Labs Lab 12/23/16 1855  INR 1.06    Sepsis Markers  Recent Labs Lab 12/23/16 1914 12/23/16 2329  LATICACIDVEN 4.06* 2.01*    ABG  Recent Labs Lab 12/23/16 2208  PHART 7.396  PCO2ART 48.1*  PO2ART 63.0*    Liver Enzymes  Recent Labs Lab 12/23/16 1855  AST 14*  ALT 8*  ALKPHOS 85  BILITOT 0.4  ALBUMIN 3.0*    Cardiac Enzymes No results for input(s): TROPONINI, PROBNP in the last 168 hours.  Glucose No results for input(s): GLUCAP in the last 168 hours.  Imaging Dg Chest Portable 1 View  Result Date: 12/23/2016 CLINICAL DATA:   Hypotension. EXAM: PORTABLE CHEST 1 VIEW COMPARISON:  11/26/2016, 10/01/2016 and earlier. FINDINGS: Cardiac silhouette upper normal in size for the AP portable technique, unchanged. Thoracic aorta mildly atherosclerotic, unchanged. Hilar and mediastinal contours otherwise unremarkable. Mildly prominent bronchovascular markings diffusely, unchanged. No new pulmonary parenchymal abnormalities. No visible pleural effusions. IMPRESSION: Stable mild changes of chronic bronchitis and/or asthma. No acute cardiopulmonary disease. Electronically Signed   By: Evangeline Dakin M.D.   On: 12/23/2016 19:06     STUDIES:  12/23/2016 chest x-ray images personally reviewed showing normal pulmonary parenchyma  CULTURES: February 5 blood culture February 5 urinary culture  ANTIBIOTICS: February 5 vancomycin February 5 cefepime  SIGNIFICANT EVENTS:   LINES/TUBES:   DISCUSSION: 51 year old female with a past medical history significant for chronic hypotension with the baseline blood pressure in the 80s to 90s, sacral wound, peripheral arterial disease with a foot wound presented [facility] department with a chief complaint of hypotension as noted by her primary care physician. She reports no change in her baseline chronic pain particularly in the area of her sacral ulcer and her right foot ulcer. She says that she doesn't eat very well but she tries to keep up with liquids. She reports no recent nausea vomiting or diarrhea. Pulmonary and critical care medicine was consulted because of persistent hypotension despite normal mental status, resolution of baseline blood pressure with awakening of the patient, and clearing of her lactic acid elevation.  ASSESSMENT / PLAN:  PULMONARY A: No acute issues P:   Monitor respiratory status  CARDIOVASCULAR A:  Chronic hypotension > worsened numbers when asleep (circadian hypotension) Septic shock> clearing lactic acid, improved clinical signs of  perfusion Still volume deplete Peripheral arterial disease P:  Telemetry monitoring Give another liter of saline now Neo-Synephrine titrated to systolic blood pressure 15-17 Start oral Midodrine Check cortisol Wean off Neo-Synephrine  RENAL A:   Mild AKI P:   Monitor BMET and UOP Replace electrolytes as needed Continue IVF  GASTROINTESTINAL A:   No acute issues P:   Advance diet  HEMATOLOGIC A:   No acute issues P:  Monitor for bleeding  INFECTIOUS A:   Present on admission Chronic Sacral wound> previously followed by surgery and wound care R leg wound > peripheral arterial disease P:   Wound care consult Consider general surgery consult sacral wound Continue broad spectrum antibiotics  ENDOCRINE A:   DM2   P:   SSI, lantus  NEUROLOGIC A:   Chronic pain P:   Minimize narcotics  Family updated: none bedside  CC time: 31 minutes  Roselie Awkward, MD [place] PCCM Pager: [phone] Cell: [phone] After 3pm or if no response, call [phone]

## 2016-12-24 NOTE — ED Notes (Signed)
CCM at bedside 

## 2016-12-24 NOTE — Progress Notes (Signed)
[facility]  Patient Status: Active (receiving services up to time of hospitalization)  [facility] is providing the following services: RN  If patient discharges after hours, please call [phone].   Janae Sauce 12/24/2016, 5:00 PM

## 2016-12-24 NOTE — Progress Notes (Signed)
Initial Nutrition Assessment  DOCUMENTATION CODES:   Not applicable  INTERVENTION:    Glucerna Shake po TID, each supplement provides 220 kcal and 10 grams of protein  Multivitamin daily   NUTRITION DIAGNOSIS:   Increased nutrient needs related to wound healing as evidenced by estimated needs.  GOAL:   Patient will meet greater than or equal to 90% of their needs  MONITOR:   PO intake, Supplement acceptance, Skin, Labs  REASON FOR ASSESSMENT:   Malnutrition Screening Tool, Consult Wound healing  ASSESSMENT:   51 year old female with PMH of chronic hypotension, sacral wound, peripheral arterial disease with a foot wound presented [facility] with a chief complaint of hypotension.  Unable to speak with patient or complete nutrition focused physical exam at this time. Per discussion with RN, patient was out of her room for MRI during breakfast, so she didn't eat any breakfast. She likes the Glucerna Shakes and is willing to drink them between meals.  Patient with significant amount of weight loss (11%) within the past 3 months. Labs reviewed: potassium 3.2, prealbumin 12.4 Medications reviewed and include KCl.  Diet Order:  Diet Carb Modified Fluid consistency: Thin; Room service appropriate? 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  Estimated Nutritional Needs:   Kcal:  1800-2000  Protein:  90-100 gm  Fluid:  1.8-2 L  EDUCATION NEEDS:   No education needs identified at this time  Molli Barrows, [place], [place], [place] Pager [phone] After Hours Pager [phone]

## 2016-12-24 NOTE — Progress Notes (Signed)
PULMONARY / CRITICAL CARE MEDICINE   Name: [name] MRN: [id number] DOB: [birth year]    ADMISSION DATE:  12/23/2016 CONSULTATION DATE:  12/24/16   REFERRING MD:  Lorin Mercy   CHIEF COMPLAINT:  Hypotension   HISTORY OF PRESENT ILLNESS:   [name] is a 51 y.o. female with PMH as outlined below. She was sent to [facility] ED 01/05 from her pain clinic where she was found to have hypotension.  She is followed in pain clinic due to pain from wound vac to right buttock.  She has not had any recent fevers/chills/sweats, headaches, chest pain, SOB, N/V/D, abd pain, myalgias.  She has a wound to right lateral malleolus but this has not any new drainage recently.  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  Had wound vac then but was removed.  Had wound vac replaced 1 week ago.  In ED, she was found to have SBP in 60's.  She states that her normal SBP is in the 80's - 90's.  She has had MAP's in mid 49's while in ED and has continued to have good mental status.  SUBJECTIVE:  Currently on pressors, phenylephrine drip  Awake and alert   VITAL SIGNS: BP (!) 97/55   Pulse 78   Temp 98.1 F (36.7 C) (Oral)   Resp 13   Ht 5\' 6"  (1.676 m)   Wt 144 lb 10 oz (65.6 kg)   SpO2 100%   BMI 23.34 kg/m    INTAKE / OUTPUT: I/O last 3 completed shifts: In: 296.4 [I.V.:246.4; IV Piggyback:50] Out: 600 [Urine:600]  PHYSICAL EXAMINATION: General:  Elderly, sickly appearing lady, NAD  Neuro:  Moving all extremities, alert and orientated  HEENT:  MMM, no lymphadenopathy  Cardiovascular:  RRR, no m/r/g Lungs:  No increased WOB, CTAB Abdomen:  BS+, NTND Musculoskeletal:  No swelling in lower extremities Skin: wound vac in place along lower back  LABS:  BMET  Recent Labs Lab 12/23/16 1855 12/24/16 0408  NA 130* 138  K 3.7 3.2*  CL 84* 99*  CO2 30 28  BUN 25* 15  CREATININE 1.10* 0.75  GLUCOSE 357* 344*    Electrolytes  Recent Labs Lab 12/23/16 1855 12/24/16 0408   CALCIUM 9.9 8.6*    CBC  Recent Labs Lab 12/23/16 1855 12/24/16 0408  WBC 16.9* 13.2*  HGB 13.2 10.4*  HCT 39.9 32.7*  PLT 440* 399    Coag's  Recent Labs Lab 12/23/16 1855  INR 1.06    Sepsis Markers  Recent Labs Lab 12/23/16 2329 12/24/16 0154 12/24/16 0407  LATICACIDVEN 2.01* 2.0* 1.2  PROCALCITON  --   --  0.13    ABG  Recent Labs Lab 12/23/16 2208  PHART 7.396  PCO2ART 48.1*  PO2ART 63.0*    Liver Enzymes  Recent Labs Lab 12/23/16 1855  AST 14*  ALT 8*  ALKPHOS 85  BILITOT 0.4  ALBUMIN 3.0*    Cardiac Enzymes No results for input(s): TROPONINI, PROBNP in the last 168 hours.  Glucose  Recent Labs Lab 12/24/16 0237  GLUCAP 328*    Imaging Dg Chest Portable 1 View  Result Date: 12/23/2016 CLINICAL DATA:  Hypotension. EXAM: PORTABLE CHEST 1 VIEW COMPARISON:  11/26/2016, 10/01/2016 and earlier. FINDINGS: Cardiac silhouette upper normal in size for the AP portable technique, unchanged. Thoracic aorta mildly atherosclerotic, unchanged. Hilar and mediastinal contours otherwise unremarkable. Mildly prominent bronchovascular markings diffusely, unchanged. No new pulmonary parenchymal abnormalities. No visible pleural effusions. IMPRESSION: Stable  mild changes of chronic bronchitis and/or asthma. No acute cardiopulmonary disease. Electronically Signed   By: Evangeline Dakin M.D.   On: 12/23/2016 19:06    STUDIES:  12/23/2016 chest x-ray images wnl   CULTURES: 2/5 blood culture>>> 2/5 urine culture>>>  ANTIBIOTICS: 2/5 vancomycin>>> 2/5 cefepime>>>2/6 2/5 Diflucan>>> 2/5 Metronidazole>>>2/6 2/6 ceftaz>>>  SIGNIFICANT EVENTS: Admit to ICU 2/6   LINES/TUBES: None   DISCUSSION: 51 year old female with a past medical history significant for chronic hypotension with the baseline blood pressure in the 80s to 90s, sacral wound, peripheral arterial disease with a foot wound presented to the [facility] emergency department with a chief  complaint of hypotension as noted by her primary care physician. She reports no change in her baseline chronic pain particularly in the area of her sacral ulcer and her right foot ulcer. She says that she doesn't eat very well but she tries to keep up with liquids. She reports no recent nausea vomiting or diarrhea. Pulmonary and critical care medicine was consulted because of persistent hypotension despite normal mental status, resolution of baseline blood pressure with awakening of the patient, and clearing of her lactic acid elevation.  ASSESSMENT / PLAN:  PULMONARY A: No acute issues P:   Monitor respiratory status  CARDIOVASCULAR A:  Chronic hypotension > worsened numbers when asleep (circadian hypotension) Septic shock> clearing lactic acid, improved clinical signs of perfusion Still volume deplete Peripheral arterial disease Rel AI P:  Lactic acid titrated to normal - no more lactic needed Received 5 L of fluid, LR @ 125 cc per hour  Neo-Synephrine titrated to systolic blood pressure 27-25 Continue oral Midodrine Cortisol 11.2, consider adding hydrocortisone 50 mg QID  Home sys 80, use this as goal  RENAL A:   Mild AKI P:   Continue IVFs to saline BMET Hypokalemia - repleted   GASTROINTESTINAL A:   No BM since admission to ICU C. Diff pcr initial order then canceled  P:   Consider diet if no surgery  Enteric precautions - consider canceling   HEMATOLOGIC A:   No acute issues P:  Monitor for bleeding  INFECTIOUS A:   Present on admission Chronic Sacral wound> previously followed by surgery and wound care R leg wound > peripheral arterial disease CRP slightly elevated  Lactic acidosis resolved  Procalcitonin wnl  Continue to have leukocytosis  Wound vac present  P:   Continue CBC checks Wound care consult Consider general surgery consult sacral wound Continue vancomycin, cefepime, diflucan, metronidazole   ENDOCRINE A:   DM2   P:   CBGs  elevated in the 300s SSI Lantus 36 units BID   NEUROLOGIC A:   Chronic pain P:   Continue home Norco  Holding home flexeril and pregablin  Minimize narcotics  STAFF NOTE: I, Merrie Roof, MD FACP have personally reviewed patient's available data, including medical history, events of note, physical examination and test results as part of my evaluation. I have discussed with resident/NP and other care providers such as pharmacist, RN and RRT. In addition, I personally evaluated patient and elicited key findings of: awake, fc, no distress, lungs clear, ankle  Mild erythema, min to no drainage, had sepsis syndrome, runs low MAP at baseline, lactic has resolved, rt glut wound vac, needs wound care, to follow mRI ankle, may need gen surgery to evaluation, change fluids to saline, no edema on pcxr, keep pos balanc, change to ceftaz, dc flagyl, cefepime, continued vanc, diflucan x 3 doses then dc for thrush, neo to  MAP goal 50 or sys 80 ( baseline) with good mental status which she has had the entire time, shem eets criteria rel AI - add stress hydrocort and florinef, I updated sister in room The patient is critically ill with multiple organ systems failre and requires high complexity decision making for assessment and support, frequent evaluation and titration of therapies, application of advanced monitoring technologies and extensive interpretation of multiple databases.   Critical Care Time devoted to patient care services described in this note is 30 Minutes. This time reflects time of care of this signee: Merrie Roof, MD FACP. This critical care time does not reflect procedure time, or teaching time or supervisory time of PA/NP/Med student/Med Resident etc but could involve care discussion time. Rest per NP/medical resident whose note is outlined above and that I agree with   Lavon Paganini. Titus Mould, MD, [place] Pgr: [facility] 12/24/2016 11:23 AM

## 2016-12-25 ENCOUNTER — Other Ambulatory Visit: Payer: Self-pay

## 2016-12-25 DIAGNOSIS — L97309 Non-pressure chronic ulcer of unspecified ankle with unspecified severity: Secondary | ICD-10-CM

## 2016-12-25 DIAGNOSIS — I70244 Atherosclerosis of native arteries of left leg with ulceration of heel and midfoot: Secondary | ICD-10-CM

## 2016-12-25 DIAGNOSIS — S31829D Unspecified open wound of left buttock, subsequent encounter: Secondary | ICD-10-CM

## 2016-12-25 LAB — GLUCOSE, CAPILLARY
GLUCOSE-CAPILLARY: 368 mg/dL — AB (ref 65–99)
GLUCOSE-CAPILLARY: 177 mg/dL — AB (ref 65–99)
GLUCOSE-CAPILLARY: 126 mg/dL — AB (ref 65–99)
Glucose-Capillary: 119 mg/dL — ABNORMAL HIGH (ref 65–99)
Glucose-Capillary: 363 mg/dL — ABNORMAL HIGH (ref 65–99)

## 2016-12-25 LAB — BASIC METABOLIC PANEL
Anion gap: 11 (ref 5–15)
BUN: 12 mg/dL (ref 6–20)
CALCIUM: 9.1 mg/dL (ref 8.9–10.3)
CHLORIDE: 96 mmol/L — AB (ref 101–111)
CO2: 32 mmol/L (ref 22–32)
CREATININE: 0.74 mg/dL (ref 0.44–1.00)
Glucose, Bld: 374 mg/dL — ABNORMAL HIGH (ref 65–99)
Potassium: 4.5 mmol/L (ref 3.5–5.1)
SODIUM: 139 mmol/L (ref 135–145)

## 2016-12-25 LAB — URINE CULTURE

## 2016-12-25 LAB — HEMOGLOBIN A1C
HEMOGLOBIN A1C: 11.2 % — AB (ref 4.8–5.6)
MEAN PLASMA GLUCOSE: 275 mg/dL

## 2016-12-25 MED ORDER — DOCUSATE SODIUM 100 MG PO CAPS
200.0000 mg | ORAL_CAPSULE | Freq: Every day | ORAL | Status: DC
  Administered 2016-12-25 – 2016-12-26 (×2): 200 mg via ORAL
  Filled 2016-12-25 (×7): qty 2

## 2016-12-25 MED ORDER — LIVING WELL WITH DIABETES BOOK
Freq: Once | Status: AC
  Administered 2016-12-25: 09:00:00
  Filled 2016-12-25: qty 1

## 2016-12-25 MED ORDER — HYDROCORTISONE NA SUCCINATE PF 100 MG IJ SOLR: 25.0000 mg | Freq: Two times a day (BID) | INTRAMUSCULAR | Status: DC

## 2016-12-25 MED ORDER — INSULIN GLARGINE 100 UNIT/ML ~~LOC~~ SOLN
50.0000 [IU] | Freq: Two times a day (BID) | SUBCUTANEOUS | Status: DC
  Administered 2016-12-25 – 2016-12-27 (×4): 50 [IU] via SUBCUTANEOUS
  Filled 2016-12-25 (×5): qty 0.5

## 2016-12-25 NOTE — Progress Notes (Addendum)
Inpatient Diabetes Program Recommendations  AACE/ADA: New Consensus Statement on Inpatient Glycemic Control (2015)  Target Ranges:  Prepandial:   less than 140 mg/dL      Peak postprandial:   less than 180 mg/dL (1-2 hours)      Critically ill patients:  140 - 180 mg/dL  Results for [name], [name] (MRN [id number]) as of 12/25/2016 11:36  Ref. Range 12/24/2016 02:37 12/24/2016 12:25 12/24/2016 17:38 12/24/2016 21:40 12/25/2016 08:27  Glucose-Capillary Latest Ref Range: 65 - 99 mg/dL 328 (H) 193 (H) 202 (H) 344 (H) 363 (H)   Review of Glycemic Control  Diabetes history: DM 2 Outpatient Diabetes medications: Lantus 36 BID, Humalog 10 units TID Current orders for Inpatient glycemic control: Lantus 36 units BID, Novolog Moderate TID + HS scale + Novolog 10 units once  Inpatient Diabetes Program Recommendations:   Patient received 3 doses of Lantus yesterday. Glucose still elevated on the IV Solucortef and Florinef.  Consider increasing increasing Lantus to 44 units BID, adjusting Novolog Correction Q4 hour coverage, Novolog 6 units TID meal coverage.  Spoke with patient about A1c level and DM control at home. Patient reports her husband is in control of shopping for meals and drives an EMS trick for the county. Patient reports husband cooks high starchy meals. Spoke with patient about our written materials and videos on meals when her husband comes to the hospital.  Patient reports financial issues as a barrier to not eating healthy saying her wound vac takes $300/month out of her disability check. Patient reports she uses savings card for her insulins and she is able to afford them. Patient reports knowing what to eat and when she is cooking she can control her glucose more. She reports just recently working with her PCP to get her glucose under control. Patient feels that a correction scale of short acting would help and she requests if we can give her one at discharge since her doctor did not know of a  correction scale to give her and only goes by a set amount of meal coverage.  Thanks,  Tama Headings RN, MSN, [facility] Inpatient Diabetes Coordinator Team Pager [phone] (8a-5p)

## 2016-12-25 NOTE — Progress Notes (Signed)
PROGRESS NOTE    [name]  QIW:[id number] DOB: [birth year] DOA: 12/23/2016 PCP: [name], MD  Brief Narrative: Patient is a 51 year old female with a history of hypertension, chronic pain, tobacco abuse type 2 diabetes, right lower extremity lateral malleolus eschars who was admitted 1113/17 with necrotizing fasciitis. She underwent debridement of the right buttocks wound. It was 16 x 11 x 3 cm deep. During that hospitalization she had an NSTEMI, and right-sided weakness with stroke like symptoms. She was ultimately discharged and was being followed by the wound care clinic and Dr. Nils Pyle. She returned on 11/27/16 with hypotension and dizziness. Was re admitted with hypotension  BP in the 60's; with possible sepsis on 11/27/16.  She was reevaluated on 110/18 by Dr. Donnie Mesa. He recommended ongoing hydrotherapy and local wound care. Bp soft at baseline Also has R leg wound/chronic Transferred from PCCM to [facility] 2/7   Assessment & Plan: 1. Hypotension with possible sepsis -improved with fluid resuscitation, pressors and empiric Abx -Blood Cx negative, urine cx polymicrobial -per CCS -sacral wound unlikley to be source of sepsis -has R ankle wound- MRI R ankle concerning for cellulitis about the lateral ankle with a soft tissue wound overlying the lateral malleolus. Edema and enhancement in the distal 4.5-5 cm of the fibula are consistent with osteomyelitis, has severe PAD with ABI of 0.45, followed by Dr.Brabham will ask VVS for input -resumed home midodrine -cut down Hydrocortisone, no evidence of AI, random cortisol 11.3  2. Hx of necrotizing fasciitis of the right buttocks; status post debridement 09/30/16,Dr. Georganna Skeans -appreciate CCS input, wound appears to be healing well -continue wound care, followed by [facility] [place]  3. Chronic back and neck pain - - chronic pain management , vicodin  4. Type 2 diabetes -DM uncontrolled, increase lantus, SSI  5.  COPD/ tobacco use started age 81 -stable, nebs PRN  6. PAD/status post right fem-tib below-knee popliteal bypass grafting -with ischemic ulcer R leg, VVS consulted as above  DVT prophylaxis:Lovenox Code Status:Full Code Family Communication:None at bedside Disposition Plan:Pending vascular/Osteo workup   Consultants:   Vascular   Procedures:  Antimicrobials: Vanc/Fortaz  Subjective: Feels ok, no specific complaints  Objective: Vitals:   12/24/16 1600 12/24/16 1657 12/24/16 2143 12/25/16 0641  BP: 111/70 (!) 88/48 138/65 122/66  Pulse: 87 87 88 90  Resp: 20 18 18 18   Temp:  97.5 F (36.4 C) 97.8 F (36.6 C) 98.2 F (36.8 C)  TempSrc:  Oral Oral Oral  SpO2: 98% 98% 100% 94%  Weight:      Height:        Intake/Output Summary (Last 24 hours) at 12/25/16 1438 Last data filed at 12/25/16 4174  Gross per 24 hour  Intake          1253.17 ml  Output             1800 ml  Net          -546.83 ml   Filed Weights   12/24/16 0300  Weight: 65.6 kg (144 lb 10 oz)    Examination:  General exam: Appears calm and comfortable, AAOx3, no distress Respiratory system: Clear to auscultation. Respiratory effort normal. Cardiovascular system: S1 & S2 heard, RRR. No JVD, murmurs, rubs, gallops or clicks. No pedal edema. Gastrointestinal system: Abdomen is nondistended, soft and nontender. No organomegaly or masses felt. Normal bowel sounds heard, large sacral decub with pink gram tissue and small eschar/purulence at one edge Central nervous  system: Alert and oriented. No focal neurological deficits. Extremities: R leg wound with deep ulcer with purulence near ankle, mild surrounding erythema Skin: No rashes, lesions or ulcers Psychiatry: Judgement and insight appear normal. Mood & affect appropriate.     Data Reviewed: I have personally reviewed following labs and imaging studies  CBC:  Recent Labs Lab 12/23/16 1855 12/24/16 0408  WBC 16.9* 13.2*  NEUTROABS 12.4*  --     HGB 13.2 10.4*  HCT 39.9 32.7*  MCV 80.8 81.1  PLT 440* 572   Basic Metabolic Panel:  Recent Labs Lab 12/23/16 1855 12/24/16 0408 12/25/16 0802  NA 130* 138 139  K 3.7 3.2* 4.5  CL 84* 99* 96*  CO2 30 28 32  GLUCOSE 357* 344* 374*  BUN 25* 15 12  CREATININE 1.10* 0.75 0.74  CALCIUM 9.9 8.6* 9.1   GFR: Estimated Creatinine Clearance: 77 mL/min (by C-G formula based on SCr of 0.74 mg/dL). Liver Function Tests:  Recent Labs Lab 12/23/16 1855  AST 14*  ALT 8*  ALKPHOS 85  BILITOT 0.4  PROT 7.5  ALBUMIN 3.0*   No results for input(s): LIPASE, AMYLASE in the last 168 hours. No results for input(s): AMMONIA in the last 168 hours. Coagulation Profile:  Recent Labs Lab 12/23/16 1855  INR 1.06   Cardiac Enzymes: No results for input(s): CKTOTAL, CKMB, CKMBINDEX, TROPONINI in the last 168 hours. BNP (last 3 results) No results for input(s): PROBNP in the last 8760 hours. HbA1C:  Recent Labs  12/24/16 0407  HGBA1C 11.2*   CBG:  Recent Labs Lab 12/24/16 1225 12/24/16 1738 12/24/16 2140 12/25/16 0827 12/25/16 1157  GLUCAP 193* 202* 344* 363* 368*   Lipid Profile: No results for input(s): CHOL, HDL, LDLCALC, TRIG, CHOLHDL, LDLDIRECT in the last 72 hours. Thyroid Function Tests: No results for input(s): TSH, T4TOTAL, FREET4, T3FREE, THYROIDAB in the last 72 hours. Anemia Panel: No results for input(s): VITAMINB12, FOLATE, FERRITIN, TIBC, IRON, RETICCTPCT in the last 72 hours. Urine analysis:    Component Value Date/Time   COLORURINE STRAW (A) 12/23/2016 2221   APPEARANCEUR CLEAR 12/23/2016 2221   LABSPEC 1.002 (L) 12/23/2016 2221   PHURINE 6.0 12/23/2016 2221   GLUCOSEU >=500 (A) 12/23/2016 2221   HGBUR NEGATIVE 12/23/2016 2221   BILIRUBINUR NEGATIVE 12/23/2016 2221   KETONESUR NEGATIVE 12/23/2016 2221   PROTEINUR NEGATIVE 12/23/2016 2221   UROBILINOGEN 0.2 06/25/2011 1707   NITRITE NEGATIVE 12/23/2016 2221   LEUKOCYTESUR NEGATIVE 12/23/2016  2221   Sepsis Labs: @LABRCNTIP (procalcitonin:4,lacticidven:4)  ) Recent Results (from the past 240 hour(s))  Culture, blood (Routine x 2)     Status: None (Preliminary result)   Collection Time: 12/23/16  6:55 PM  Result Value Ref Range Status   Specimen Description BLOOD RIGHT FOREARM  Final   Special Requests IN PEDIATRIC BOTTLE 2CC  Final   Culture NO GROWTH < 24 HOURS  Final   Report Status PENDING  Incomplete  Culture, blood (Routine x 2)     Status: None (Preliminary result)   Collection Time: 12/23/16  8:20 PM  Result Value Ref Range Status   Specimen Description BLOOD RIGHT HAND  Final   Special Requests BOTTLES DRAWN AEROBIC AND ANAEROBIC 5CC  Final   Culture NO GROWTH < 24 HOURS  Final   Report Status PENDING  Incomplete  Urine culture     Status: Abnormal   Collection Time: 12/23/16 10:21 PM  Result Value Ref Range Status   Specimen Description URINE, RANDOM  Final  Special Requests NONE  Final   Culture MULTIPLE SPECIES PRESENT, SUGGEST RECOLLECTION (A)  Final   Report Status 12/25/2016 FINAL  Final  MRSA PCR Screening     Status: None   Collection Time: 12/24/16  4:19 AM  Result Value Ref Range Status   MRSA by PCR NEGATIVE NEGATIVE Final    Comment:        The GeneXpert MRSA Assay (FDA approved for NASAL specimens only), is one component of a comprehensive MRSA colonization surveillance program. It is not intended to diagnose MRSA infection nor to guide or monitor treatment for MRSA infections.          Radiology Studies: Mr Ankle Right W Wo Contrast  Result Date: 12/24/2016 CLINICAL DATA:  Lateral right ankle wound of unknown duration. Sepsis. Question abscess or osteomyelitis. EXAM: MRI OF THE RIGHT ANKLE WITHOUT AND WITH CONTRAST TECHNIQUE: Multiplanar, multisequence MR imaging of the ankle was performed before and after the administration of intravenous contrast. CONTRAST:  13 ml MULTIHANCE GADOBENATE DIMEGLUMINE 529 MG/ML IV SOLN COMPARISON:   Plain films right ankle 10/03/2016 FINDINGS: Soft tissue wound is seen over the lateral malleolus. There is subcutaneous edema and enhancement about the lateral aspect of the ankle compatible cellulitis. No abscess. TENDONS Peroneal: Intact. Posteromedial: Intact. Anterior: Intact. Achilles: Mild thickening of the distal tendon is compatible tendinopathy without tear. Plantar Fascia: Intact. LIGAMENTS Lateral: Intact. Medial: Intact. CARTILAGE Ankle Joint: No effusion or osteochondral lesion. Subtalar Joints/Sinus Tarsi: Negative. Bones: Intense edema and enhancement are seen in the distal 4.5-5 cm of the fibula consistent with osteomyelitis. Other: None. IMPRESSION: IMPRESSION Cellulitis about the lateral ankle with a soft tissue wound overlying the lateral malleolus. Edema and enhancement in the distal 4.5-5 cm of the fibula are consistent with osteomyelitis. Negative for abscess. Mild appearing Achilles tendinopathy without tear. Electronically Signed   By: Inge Rise M.D.   On: 12/24/2016 11:49   Dg Chest Portable 1 View  Result Date: 12/23/2016 CLINICAL DATA:  Hypotension. EXAM: PORTABLE CHEST 1 VIEW COMPARISON:  11/26/2016, 10/01/2016 and earlier. FINDINGS: Cardiac silhouette upper normal in size for the AP portable technique, unchanged. Thoracic aorta mildly atherosclerotic, unchanged. Hilar and mediastinal contours otherwise unremarkable. Mildly prominent bronchovascular markings diffusely, unchanged. No new pulmonary parenchymal abnormalities. No visible pleural effusions. IMPRESSION: Stable mild changes of chronic bronchitis and/or asthma. No acute cardiopulmonary disease. Electronically Signed   By: Evangeline Dakin M.D.   On: 12/23/2016 19:06        Scheduled Meds: . aspirin EC  81 mg Oral Daily  . cefTAZidime (FORTAZ)  IV  1 g Intravenous Q8H  . enoxaparin (LOVENOX) injection  40 mg Subcutaneous Daily  . feeding supplement (GLUCERNA SHAKE)  237 mL Oral TID BM  . fenofibrate  54 mg  Oral Daily  . fluconazole  100 mg Oral Daily  . fludrocortisone  0.1 mg Oral Daily  . hydrocortisone sod succinate (SOLU-CORTEF) inj  50 mg Intravenous Q6H  . insulin aspart  0-15 Units Subcutaneous TID WC  . insulin aspart  0-5 Units Subcutaneous QHS  . insulin glargine  36 Units Subcutaneous BID  . LORazepam  1 mg Intravenous Once  . mouth rinse  15 mL Mouth Rinse BID  . midodrine  10 mg Oral TID WC  . multivitamin with minerals  1 tablet Oral Daily  . pantoprazole  80 mg Oral Daily  . pregabalin  100 mg Oral TID  . sodium chloride flush  3 mL Intravenous Q12H  . vancomycin  500 mg Intravenous Q12H   Continuous Infusions: . sodium chloride 125 mL/hr at 12/25/16 0639  . phenylephrine (NEO-SYNEPHRINE) Adult infusion Stopped (12/24/16 0728)     LOS: 2 days    Time spent: 30min    Domenic Polite, MD [name] Pager [phone]  If 7PM-7AM, please contact night-coverage [email or web address] Password [facility] 12/25/2016, 2:38 PM

## 2016-12-25 NOTE — Care Management Note (Addendum)
Case Management Note  Patient Details  Name: [name] MRN: [id number] Date of Birth: [birth year]  Subjective/Objective:                 Patient from home, active with [facility] prior to admission for [facility] RN. Verified patient active for [facility] RN with Butch Penny clinical liaison [facility]. Patient with wound VAC frm home. Patient's husband has VAC at home and will bring back to hospital at time of discharge to reconnect.    Action/Plan:  Anticipate DC to home with [facility] when medically clear.  Expected Discharge Date:                  Expected Discharge Plan:  [place]  In-House Referral:     Discharge planning Services  CM Consult  Post Acute Care Choice:    Choice offered to:  Patient  DME Arranged:    DME Agency:     HH Arranged:  RN [place] Agency:  [place]  Status of Service:  In process, will continue to follow  If discussed at Long Length of Stay Meetings, dates discussed:    Additional Comments:  Carles Collet, RN 12/25/2016, 1:15 PM

## 2016-12-25 NOTE — Consult Note (Signed)
Hospital Consult    Reason for Consult:  Non healing wound Referring Physician:  Broadus John MRN #:  [id number]  History of Present Illness: This is a 51 y.o. female who presents for follow-up. She has a history of bilateral femoral popliteal bypasses. She has had multiple interventions for recurrent stenosis. She most recently underwent angioplasty and stenting of her right external iliac artery on 05/07/2016. She also had new bilateral upper extremity numbness and she underwent balloon angioplasty of the right subclavian artery at that time.  She was seen in our office in October and at that time, both bypass grafts were patent.  She was having trouble ambulating, but this was mostly due to back pain.  She did have an MRI yesterday, which revealed edema and enhancement in the distal 4.5-5cm of the right fibula consistent with osteomyelitis and negative for abscess.  She has a wound on the lateral aspect of the right lateral malleolus that has she states has been present for 4 years.    She presents to the hospital because she went to her pain management MD and she had low blood pressure and was sent to the ER.  She has had intermittent diarrhea-3 stools and had a + C diff test in [place].  She states that the diarrhea has resolved and she is now constipated.   The hypotension resolved with IVF.    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  She has been on a wound vac and is followed by the [facility] wound center.    She sees Dr. Primus Bravo at the pain management center.  She has started smoking again.  She states that she was frustrated by her sacral wound and started back.    She states that since her balloon angioplasty of the right subclavian artery, her arm is much better.  Her diabetes is not well controlled and HgbA1c on 12/24/16 was 11.2.  Past Medical History:  Diagnosis Date  . Arthritis   . Asthma   . Chronic back pain   .  Depression   . Diabetes mellitus   . Diabetic neuropathy ([place])   . Family history of adverse reaction to anesthesia    mother had difficlty waking   . GERD (gastroesophageal reflux disease)   . Hyperlipidemia   . Joint pain   . Leg pain    With Walking  . PAD (peripheral artery disease) ([place])   . Reflux   . Ulcer ([place])    Foot    Past Surgical History:  Procedure Laterality Date  . ABDOMINAL AORTAGRAM  June 15, 2014  . ABDOMINAL AORTAGRAM N/A 06/15/2014   Procedure: ABDOMINAL Maxcine Ham;  Surgeon: Serafina Mitchell, MD;  Location: [facility] CATH LAB;  Service: Cardiovascular;  Laterality: N/A;  . ABDOMINAL AORTAGRAM N/A 11/22/2014   Procedure: ABDOMINAL AORTAGRAM;  Surgeon: Serafina Mitchell, MD;  Location: [facility] CATH LAB;  Service: Cardiovascular;  Laterality: N/A;  . ARTERIAL BYPASS SURGRY  07/05/2010   Right Common Femoral to below knee popliteal BPG  . BACK SURGERY     X's  2  . CARDIAC CATHETERIZATION    . CHOLECYSTECTOMY     Gall Bladder  . CYSTECTOMY Right    foot  . CYSTECTOMY Left    wrist  . INTERCOSTAL NERVE BLOCK  November 2015  . IRRIGATION AND DEBRIDEMENT BUTTOCKS Right 09/30/2016   Procedure: DEBRIDEMENT RIGHT  BUTTOCK WOUND;  Surgeon: Georganna Skeans, MD;  Location: [facility] OR;  Service: General;  Laterality: Right;  . left foot surgery    . left wrist cyst removal Left   . PERIPHERAL VASCULAR CATHETERIZATION N/A 05/07/2016   Procedure: Abdominal Aortogram;  Surgeon: Serafina Mitchell, MD;  Location: [place] CV LAB;  Service: Cardiovascular;  Laterality: N/A;  . PERIPHERAL VASCULAR CATHETERIZATION N/A 05/07/2016   Procedure: Lower Extremity Angiography;  Surgeon: Serafina Mitchell, MD;  Location: [place] CV LAB;  Service: Cardiovascular;  Laterality: N/A;  . PERIPHERAL VASCULAR CATHETERIZATION N/A 05/07/2016   Procedure: Aortic Arch Angiography;  Surgeon: Serafina Mitchell, MD;  Location: [place] CV LAB;  Service: Cardiovascular;  Laterality: N/A;  . PERIPHERAL VASCULAR  CATHETERIZATION N/A 05/07/2016   Procedure: Upper Extremity Angiography;  Surgeon: Serafina Mitchell, MD;  Location: [place] CV LAB;  Service: Cardiovascular;  Laterality: N/A;  . PERIPHERAL VASCULAR CATHETERIZATION Right 05/07/2016   Procedure: Peripheral Vascular Balloon Angioplasty;  Surgeon: Serafina Mitchell, MD;  Location: [place] CV LAB;  Service: Cardiovascular;  Laterality: Right;  subclavian  . PERIPHERAL VASCULAR CATHETERIZATION Right 05/07/2016   Procedure: Peripheral Vascular Intervention;  Surgeon: Serafina Mitchell, MD;  Location: [place] CV LAB;  Service: Cardiovascular;  Laterality: Right;  External  Iliac  . SKIN GRAFT Right 2012   [name] by Dr. Nils Pyle- Right and Left Ankle  . SPINE SURGERY    . TONSILLECTOMY      Allergies  Allergen Reactions  . Penicillins Other (See Comments)    Severe Headache (not so sure about this now) Has patient had a PCN reaction causing immediate rash, facial/tongue/throat swelling, SOB or lightheadedness with hypotension: No Has patient had a PCN reaction causing severe rash involving mucus membranes or skin necrosis: No Has patient had a PCN reaction that required hospitalization: No Has patient had a PCN reaction occurring within the last 10 years: No If all of the above answers are "NO", then may proceed with Cephalosporin use.     Prior to Admission medications   Medication Sig Start Date End Date Taking? Authorizing Provider  albuterol (PROVENTIL) 2 MG tablet Take 2 mg by mouth 3 (three) times daily as needed for shortness of breath.    Yes Historical Provider, MD  aspirin 81 MG tablet Take 81 mg by mouth daily.    Yes Historical Provider, MD  cetirizine (ZYRTEC) 10 MG tablet Take 10 mg by mouth daily as needed for allergies. Reported on 03/27/2016   Yes Historical Provider, MD  Choline Fenofibrate (FENOFIBRIC ACID) 45 MG CPDR Take 45 mg by mouth daily. Reported on 03/18/2016 10/30/15  Yes Historical Provider, MD  cyclobenzaprine  (FLEXERIL) 10 MG tablet Limit 1 tablet by mouth 1  to  3 times per day if tolerated Patient taking differently: Take 10 mg by mouth 3 (three) times daily as needed for muscle spasms.  07/18/16  Yes Mohammed Kindle, MD  dicyclomine (BENTYL) 10 MG capsule Take 10 mg by mouth 4 (four) times daily as needed for spasms. 08/08/16 08/08/17 Yes Historical Provider, MD  furosemide (LASIX) 40 MG tablet Take 40 mg by mouth 2 (two) times daily as needed for fluid.    Yes Historical Provider, MD  HUMALOG KWIKPEN 100 UNIT/ML KiwkPen Inject 10 Units into the skin 3 (three) times daily. Per sliding scale 11/11/15  Yes Historical Provider, MD  HYDROcodone-acetaminophen (NORCO) 10-325 MG tablet Limit 1 tablet by mouth per day or twice per day if tolerated Patient taking differently: Take 1 tablet by mouth  See admin instructions. Every 8-12 hours 07/18/16  Yes Mohammed Kindle, MD  LANTUS SOLOSTAR 100 UNIT/ML Solostar Pen Inject 36 Units into the skin 2 (two) times daily.  09/15/14  Yes Historical Provider, MD  meclizine (ANTIVERT) 25 MG tablet Take 25 mg by mouth 3 (three) times daily as needed for dizziness. 11/06/16  Yes Historical Provider, MD  omeprazole (PRILOSEC) 40 MG capsule Take 40 mg by mouth daily. 11/06/16  Yes Historical Provider, MD  pregabalin (LYRICA) 100 MG capsule Limit 1 tab by mouth twice a day to 3 times a day if tolerated Patient taking differently: Take 100 mg by mouth 3 (three) times daily.  07/18/16  Yes Mohammed Kindle, MD  pseudoephedrine-guaifenesin [facility] D) 60-600 MG per tablet Take 1 tablet by mouth every 12 (twelve) hours as needed for congestion. Reported on 04/08/2016   Yes Historical Provider, MD    Social History   Social History  . Marital status: Married    Spouse name: N/A  . Number of children: N/A  . Years of education: N/A   Occupational History  . disabled    Social History Main Topics  . Smoking status: Current Every Day Smoker    Packs/day: 1.50    Years: 30.00    Types:  Cigarettes  . Smokeless tobacco: Never Used     Comment: 2 cigarettes a day  . Alcohol use No  . Drug use: No  . Sexual activity: Yes    Birth control/ protection: Post-menopausal   Other Topics Concern  . Not on file   Social History Narrative  . No narrative on file     Family History  Problem Relation Age of Onset  . Coronary artery disease Mother   . Peripheral vascular disease Mother   . Heart disease Mother     Before age 45  . Other Mother     Venous insuffiency  . Diabetes Mother   . Hyperlipidemia Mother   . Hypertension Mother   . Varicose Veins Mother   . Heart attack Mother     before age 64  . Heart disease Father   . Diabetes Father   . Diabetes Maternal Grandmother   . Diabetes Paternal Grandmother   . Diabetes Paternal Grandfather   . Diabetes Sister   . Hypertension Sister   . Diabetes Brother   . Hypertension Brother     ROS: [x]  Positive   [ ]  Negative   [ ]  All sytems reviewed and are negative  Cardiovascular: []  chest pain/pressure []  palpitations []  SOB lying flat []  DOE []  pain in legs while walking []  pain in legs at rest []  pain in legs at night [x]  non-healing ulcers []  hx of DVT [x]  swelling in legs  Pulmonary: []  productive cough [x]  asthma/wheezing []  home O2  Neurologic: []  weakness in []  arms []  legs []  numbness in []  arms []  legs []  hx of CVA []  mini stroke [] difficulty speaking or slurred speech []  temporary loss of vision in one eye []  dizziness  Hematologic: []  hx of cancer []  bleeding problems []  problems with blood clotting easily  Endocrine:   [x]  diabetes []  thyroid disease  GI []  vomiting blood []  blood in stool [x]  diarrhea [x]  GERD  GU: []  CKD/renal failure []  HD--[]  M/W/F or []  T/T/S []  burning with urination []  blood in urine  Psychiatric: []  anxiety [x]  depression  Musculoskeletal: [x]  arthritis []  joint pain [x]  back pain  Integumentary: []  rashes [x]  ulcers [x]  non healing  sacral wound  Constitutional: []  fever []  chills   Physical Examination  Vitals:   12/25/16 0641 12/25/16 1525  BP: 122/66 136/62  Pulse: 90 87  Resp: 18 19  Temp: 98.2 F (36.8 C) 98.1 F (36.7 C)   Body mass index is 23.34 kg/m.  General:  WDWN in NAD Gait: Not observed HENT: WNL, normocephalic Pulmonary: normal non-labored breathing Cardiac: regular Abdomen:  soft, NT/ND, no masses Skin: without rashes Vascular Exam/Pulses:  Right Left  Radial 2+ (normal) 2+ (normal)  Ulnar Unable to palpate  Unable to palpate   Femoral Brisk biphasic doppler signal 3+ (hyperdynamic)  Peroneal + Brisk doppler signal  + Brisk doppler signal   DP + Brisk doppler signal  + Brisk doppler signal   PT + Brisk doppler signal  + Brisk doppler signal    Extremities: quarter size wound on the lateral malleolus right leg with silver hydrofiber in wound bed. Musculoskeletal: no muscle wasting or atrophy  Neurologic: A&O X 3;  No focal weakness or paresthesias are detected; speech is fluent/normal Psychiatric:  The pt has Normal affect. Lymph:  No inguinal lymphadenopathy   CBC    Component Value Date/Time   WBC 13.2 (H) 12/24/2016 0408   RBC 4.03 12/24/2016 0408   HGB 10.4 (L) 12/24/2016 0408   HCT 32.7 (L) 12/24/2016 0408   PLT 399 12/24/2016 0408   MCV 81.1 12/24/2016 0408   MCH 25.8 (L) 12/24/2016 0408   MCHC 31.8 12/24/2016 0408   RDW 14.5 12/24/2016 0408   LYMPHSABS 3.2 12/23/2016 1855   MONOABS 0.9 12/23/2016 1855   EOSABS 0.3 12/23/2016 1855   BASOSABS 0.0 12/23/2016 1855    BMET    Component Value Date/Time   NA 139 12/25/2016 0802   K 4.5 12/25/2016 0802   CL 96 (L) 12/25/2016 0802   CO2 32 12/25/2016 0802   GLUCOSE 374 (H) 12/25/2016 0802   BUN 12 12/25/2016 0802   CREATININE 0.74 12/25/2016 0802   CALCIUM 9.1 12/25/2016 0802   GFRNONAA >60 12/25/2016 0802   GFRAA >60 12/25/2016 0802    COAGS: Lab Results  Component Value Date   INR 1.06 12/23/2016    INR 1.06 11/26/2016   INR 1.46 09/30/2016     Non-Invasive Vascular Imaging:   ABI's 12/24/16:  RIGHT    LEFT    PRESSURE WAVEFORM  PRESSURE WAVEFORM  BRACHIAL 116 Triphasic BRACHIAL 88 Biphasic  DP 42 Monophasic DP 103 Biphasic  PT 52 Monophasic PT 90 Biphasic  GREAT TOE  NA GREAT TOE 71 NA    RIGHT LEFT  ABI 0.45 0.89     Statin:  No. Beta Blocker:  No. Aspirin:  Yes.   ACEI:  No. ARB:  No. CCB use:  No Other antiplatelets/anticoagulants:  Yes.   Lovenox (DVT prophylaxis)   ASSESSMENT/PLAN: This is a 51 y.o. female with long standing non healing right lateral malleolus wound.   -pt with brisk doppler signals in bilateral DP/PT/peroneal bilaterally -ABI on the right 0.45 and was 0.56 back in August -needs better diabetic control as A1c is 11.2 -may need arteriogram to assess bypass -current wound care involves silver hydrofiber to wound bed q3days.   -MRI did show osteomyelitis right fibula-pt may benefit from ortho consult -continue current wound care -she has appt for follow up with NP in our office on 01/09/17 -Dr. Donnetta Hutching will be by to see the pt this evening.    Leontine Locket, PA-C [facility] [phone]  I have examined the patient, reviewed and agree with above. Very complex patient with staged bilateral femoral-popliteal bypasses with Dr. Trula Slade in the past. Chronic wounds on her right ankle.  Physical exam reveals 2+ left femoral and 2+ left popliteal pulse. I do not palpate a right femoral or popliteal pulse. Good Doppler flow to her foot. May need further evaluation with a probable right iliac occlusive disease. Dr. Trula Slade will see again tomorrow and follow with you  Curt Jews, MD 12/25/2016 7:16 PM

## 2016-12-25 NOTE — Evaluation (Signed)
Physical Therapy Evaluation Patient Details Name: TWANIA BUJAK MRN: [id number] DOB: 1966/04/13 Today's Date: 12/25/2016   History of Present Illness  Makenli BETHANI BRUGGER is a 51 y.o. female with medical history significant of PAD, DM with neuropathy, chronic pain, HLD, and depression presenting because she went to her pain management doctor and he sent her in because her BP was low, back and neck pain.  Has a R greater trochanter wound with VAC;  Clinical Impression  Patient evaluated by Physical Therapy with no further acute PT needs identified. All education has been completed and the patient has no further questions. * See below for any follow-up Physical Therapy or equipment needs. PT is signing off. Thank you for this referral. Pt is mobilizing at her baseline, no f/u recommended at this time; pt would benefit from amb with nursing staff    Follow Up Recommendations No PT follow up    Equipment Recommendations  None recommended by PT    Recommendations for Other Services       Precautions / Restrictions Precautions Precaution Comments: wound VAC Restrictions Weight Bearing Restrictions: No      Mobility  Bed Mobility Overal bed mobility: Modified Independent                Transfers Overall transfer level: Modified independent Equipment used: 4-wheeled walker                Ambulation/Gait Ambulation/Gait assistance: Modified independent (Device/Increase time);Supervision Ambulation Distance (Feet): 220 Feet Assistive device: 4-wheeled walker Gait Pattern/deviations: Step-through pattern;Trunk flexed     General Gait Details: pt does notrequire physical assist; needs lifting help with full size wound VAC (has smaller device at home)  Stairs            Wheelchair Mobility    Modified Rankin (Stroke Patients Only)       Balance Overall balance assessment: Modified Independent Sitting-balance support: No upper extremity supported;Feet  supported Sitting balance-Leahy Scale: Good       Standing balance-Leahy Scale: Good Standing balance comment: pt is able to reach to floor, shift out of BOS repeatedly and maneuver with and without rollator in tighter spaces without LOB             High level balance activites: Direction changes;Turns;Side stepping;Backward walking               Pertinent Vitals/Pain Pain Assessment: 0-10 Pain Score: 4  Pain Location: bck pain (chronic since childhood per pt) Pain Descriptors / Indicators: Discomfort Pain Intervention(s): Monitored during session;Repositioned    Home Living Family/patient expects to be discharged to:: Private residence Living Arrangements: Spouse/significant other Available Help at Discharge: Family Type of Home: House Home Access: Ramped entrance     Home Layout: One level Home Equipment: Environmental consultant - 2 wheels;Cane - single point;Bedside commode;Walker - 4 wheels      Prior Function Level of Independence: Independent;Independent with assistive device(s)         Comments: amb with rollator     Hand Dominance        Extremity/Trunk Assessment   Upper Extremity Assessment Upper Extremity Assessment:  (generalized diffuse muscle atrophy noted)    Lower Extremity Assessment Lower Extremity Assessment: Overall WFL for tasks assessed    Cervical / Trunk Assessment Cervical / Trunk Assessment: Other exceptions Cervical / Trunk Exceptions: generally flexed trunk/mildly kyphotic, forward head with absence of normal spine curvature  Communication   Communication: No difficulties  Cognition Arousal/Alertness: Awake/alert Behavior During Therapy: [facility]  for tasks assessed/performed Overall Cognitive Status: Within Functional Limits for tasks assessed                      General Comments      Exercises     Assessment/Plan    PT Assessment Patent does not need any further PT services  PT Problem List            PT Treatment  Interventions      PT Goals (Current goals can be found in the Care Plan section)  Acute Rehab PT Goals Patient Stated Goal: to get better PT Goal Formulation: All assessment and education complete, DC therapy    Frequency     Barriers to discharge        Co-evaluation               End of Session   Activity Tolerance: Patient tolerated treatment well Patient left: in chair;with call bell/phone within reach (sitting on pillow; bed alarm not on) Nurse Communication: Mobility status         Time: 1025-1053 PT Time Calculation (min) (ACUTE ONLY): 28 min   Charges:   PT Evaluation $PT Eval Low Complexity: 1 Procedure PT Treatments $Gait Training: 8-22 mins   PT G Codes:        Magdelyn Roebuck Jan 18, 2017, 11:14 AM

## 2016-12-26 ENCOUNTER — Inpatient Hospital Stay (HOSPITAL_COMMUNITY): Payer: Medicare Other

## 2016-12-26 ENCOUNTER — Encounter (HOSPITAL_COMMUNITY): Payer: Medicare Other

## 2016-12-26 DIAGNOSIS — I739 Peripheral vascular disease, unspecified: Secondary | ICD-10-CM

## 2016-12-26 LAB — GLUCOSE, CAPILLARY
GLUCOSE-CAPILLARY: 79 mg/dL (ref 65–99)
GLUCOSE-CAPILLARY: 122 mg/dL — AB (ref 65–99)
Glucose-Capillary: 98 mg/dL (ref 65–99)
Glucose-Capillary: 102 mg/dL — ABNORMAL HIGH (ref 65–99)

## 2016-12-26 LAB — CBC
HEMATOCRIT: 33.5 % — AB (ref 36.0–46.0)
Hemoglobin: 10.4 g/dL — ABNORMAL LOW (ref 12.0–15.0)
MCH: 25.6 pg — AB (ref 26.0–34.0)
MCHC: 31 g/dL (ref 30.0–36.0)
MCV: 82.5 fL (ref 78.0–100.0)
Platelets: 455 10*3/uL — ABNORMAL HIGH (ref 150–400)
RBC: 4.06 MIL/uL (ref 3.87–5.11)
RDW: 14.8 % (ref 11.5–15.5)
WBC: 16.3 10*3/uL — AB (ref 4.0–10.5)

## 2016-12-26 LAB — BASIC METABOLIC PANEL
Anion gap: 12 (ref 5–15)
BUN: 17 mg/dL (ref 6–20)
CHLORIDE: 98 mmol/L — AB (ref 101–111)
CO2: 30 mmol/L (ref 22–32)
Calcium: 9 mg/dL (ref 8.9–10.3)
Creatinine, Ser: 0.81 mg/dL (ref 0.44–1.00)
GFR calc Af Amer: >60 mL/min (ref >60)
GFR calc non Af Amer: >60 mL/min (ref >60)
Glucose, Bld: 82 mg/dL (ref 65–99)
POTASSIUM: 3 mmol/L — AB (ref 3.5–5.1)
SODIUM: 140 mmol/L (ref 135–145)

## 2016-12-26 MED ORDER — POTASSIUM CHLORIDE CRYS ER 20 MEQ PO TBCR
40.0000 meq | EXTENDED_RELEASE_TABLET | Freq: Once | ORAL | Status: AC
  Administered 2016-12-26: 40 meq via ORAL
  Filled 2016-12-26: qty 2

## 2016-12-26 NOTE — Progress Notes (Signed)
PROGRESS NOTE    [name]  CBJ:[id number] DOB: 1966/09/22 DOA: 12/23/2016 PCP: [name], MD  Brief Narrative: Patient is a 51 year old female with a history of hypertension, chronic pain, tobacco abuse type 2 diabetes, right lower extremity lateral malleolus eschars who was admitted 1113/17 with necrotizing fasciitis. She underwent debridement of the right buttocks wound. It was 16 x 11 x 3 cm deep. During that hospitalization she had an NSTEMI, and right-sided weakness with stroke like symptoms. She was ultimately discharged and was being followed by the wound care clinic and Dr. Nils Pyle. She returned on 11/27/16 with hypotension and dizziness. Was re admitted with hypotension  BP in the 60's; with possible sepsis on 11/27/16.  She was reevaluated on 110/18 by Dr. Donnie Mesa. He recommended ongoing hydrotherapy and local wound care. Bp soft at baseline Also has R leg wound/chronic Transferred from PCCM to [facility] 2/7   Assessment & Plan: 1. Hypotension with possible sepsis -improved with fluid resuscitation, pressors and empiric Abx -Blood Cx negative, urine cx polymicrobial -per CCS -sacral wound unlikley to be source of sepsis -has R ankle wound- MRI R ankle concerning for cellulitis about the lateral ankle with a soft tissue wound overlying the lateral malleolus. Edema and enhancement in the distal 4.5-5 cm of the fibula are consistent with osteomyelitis, has severe PAD with ABI of 0.45, followed by Dr.Brabham, appreciate VVS input, possible angiogram planned -resumed home midodrine -cut down Hydrocortisone, no evidence of AI, random cortisol 11.3  2. Hx of necrotizing fasciitis of the right buttocks; status post debridement 09/30/16,Dr. Georganna Skeans -appreciate CCS input, wound appears to be healing well -continue wound care, followed by [facility] in [place]  3. Chronic back and neck pain - - chronic pain management , vicodin  4. Type 2 diabetes -DM uncontrolled,  increase lantus, SSI  5. COPD/ tobacco use started age 77 -stable, nebs PRN  6. PAD/status post right fem-tib below-knee popliteal bypass grafting -with ischemic ulcer R leg, VVS consulted as above  DVT prophylaxis:Lovenox Code Status:Full Code Family Communication:None at bedside Disposition Plan:Pending vascular/Osteo workup   Consultants:   Vascular   Procedures:  Antimicrobials: Vanc/Fortaz  Subjective: Feels ok, no specific complaints  Objective: Vitals:   12/25/16 1525 12/25/16 2211 12/26/16 0517 12/26/16 1413  BP: 136/62 (!) 130/58 115/61 132/67  Pulse: 87 93 83 90  Resp: 19 18 20 19   Temp: 98.1 F (36.7 C) 98.2 F (36.8 C) 98.1 F (36.7 C)   TempSrc: Oral Oral Oral   SpO2: 100% 95% 95% 99%  Weight:      Height:        Intake/Output Summary (Last 24 hours) at 12/26/16 1429 Last data filed at 12/26/16 1607  Gross per 24 hour  Intake          3041.25 ml  Output             1000 ml  Net          2041.25 ml   Filed Weights   12/24/16 0300  Weight: 65.6 kg (144 lb 10 oz)    Examination:  General exam: Appears calm and comfortable, AAOx3, no distress Respiratory system: Clear to auscultation. Respiratory effort normal. Cardiovascular system: S1 & S2 heard, RRR. No JVD, murmurs, rubs, gallops or clicks. No pedal edema. Gastrointestinal system: Abdomen is nondistended, soft and nontender. Normal bowel sounds heard, large sacral decub with pink gram tissue and small eschar/purulence at one edge Central nervous system: Alert and oriented.  No focal neurological deficits. Extremities: R leg wound with deep ulcer with purulence near ankle, mild surrounding erythema Skin: No rashes, lesions or ulcers Psychiatry: Judgement and insight appear normal. Mood & affect appropriate.     Data Reviewed: I have personally reviewed following labs and imaging studies  CBC:  Recent Labs Lab 12/23/16 1855 12/24/16 0408 12/26/16 0418  WBC 16.9* 13.2* 16.3*    NEUTROABS 12.4*  --   --   HGB 13.2 10.4* 10.4*  HCT 39.9 32.7* 33.5*  MCV 80.8 81.1 82.5  PLT 440* 399 161*   Basic Metabolic Panel:  Recent Labs Lab 12/23/16 1855 12/24/16 0408 12/25/16 0802 12/26/16 0418  NA 130* 138 139 140  K 3.7 3.2* 4.5 3.0*  CL 84* 99* 96* 98*  CO2 30 28 32 30  GLUCOSE 357* 344* 374* 82  BUN 25* 15 12 17   CREATININE 1.10* 0.75 0.74 0.81  CALCIUM 9.9 8.6* 9.1 9.0   GFR: Estimated Creatinine Clearance: 76.1 mL/min (by C-G formula based on SCr of 0.81 mg/dL). Liver Function Tests:  Recent Labs Lab 12/23/16 1855  AST 14*  ALT 8*  ALKPHOS 85  BILITOT 0.4  PROT 7.5  ALBUMIN 3.0*   No results for input(s): LIPASE, AMYLASE in the last 168 hours. No results for input(s): AMMONIA in the last 168 hours. Coagulation Profile:  Recent Labs Lab 12/23/16 1855  INR 1.06   Cardiac Enzymes: No results for input(s): CKTOTAL, CKMB, CKMBINDEX, TROPONINI in the last 168 hours. BNP (last 3 results) No results for input(s): PROBNP in the last 8760 hours. HbA1C:  Recent Labs  12/24/16 0407  HGBA1C 11.2*   CBG:  Recent Labs Lab 12/25/16 1711 12/25/16 2210 12/25/16 2335 12/26/16 0803 12/26/16 1214  GLUCAP 177* 126* 119* 98 102*   Lipid Profile: No results for input(s): CHOL, HDL, LDLCALC, TRIG, CHOLHDL, LDLDIRECT in the last 72 hours. Thyroid Function Tests: No results for input(s): TSH, T4TOTAL, FREET4, T3FREE, THYROIDAB in the last 72 hours. Anemia Panel: No results for input(s): VITAMINB12, FOLATE, FERRITIN, TIBC, IRON, RETICCTPCT in the last 72 hours. Urine analysis:    Component Value Date/Time   COLORURINE STRAW (A) 12/23/2016 2221   APPEARANCEUR CLEAR 12/23/2016 2221   LABSPEC 1.002 (L) 12/23/2016 2221   PHURINE 6.0 12/23/2016 2221   GLUCOSEU >=500 (A) 12/23/2016 2221   HGBUR NEGATIVE 12/23/2016 2221   BILIRUBINUR NEGATIVE 12/23/2016 2221   KETONESUR NEGATIVE 12/23/2016 2221   PROTEINUR NEGATIVE 12/23/2016 2221   UROBILINOGEN  0.2 06/25/2011 1707   NITRITE NEGATIVE 12/23/2016 2221   LEUKOCYTESUR NEGATIVE 12/23/2016 2221   Sepsis Labs: @LABRCNTIP (procalcitonin:4,lacticidven:4)  ) Recent Results (from the past 240 hour(s))  Culture, blood (Routine x 2)     Status: None (Preliminary result)   Collection Time: 12/23/16  6:55 PM  Result Value Ref Range Status   Specimen Description BLOOD RIGHT FOREARM  Final   Special Requests IN PEDIATRIC BOTTLE 2CC  Final   Culture NO GROWTH 3 DAYS  Final   Report Status PENDING  Incomplete  Culture, blood (Routine x 2)     Status: None (Preliminary result)   Collection Time: 12/23/16  8:20 PM  Result Value Ref Range Status   Specimen Description BLOOD RIGHT HAND  Final   Special Requests BOTTLES DRAWN AEROBIC AND ANAEROBIC 5CC  Final   Culture NO GROWTH 3 DAYS  Final   Report Status PENDING  Incomplete  Urine culture     Status: Abnormal   Collection Time: 12/23/16 10:21 PM  Result Value Ref Range Status   Specimen Description URINE, RANDOM  Final   Special Requests NONE  Final   Culture MULTIPLE SPECIES PRESENT, SUGGEST RECOLLECTION (A)  Final   Report Status 12/25/2016 FINAL  Final  MRSA PCR Screening     Status: None   Collection Time: 12/24/16  4:19 AM  Result Value Ref Range Status   MRSA by PCR NEGATIVE NEGATIVE Final    Comment:        The GeneXpert MRSA Assay (FDA approved for NASAL specimens only), is one component of a comprehensive MRSA colonization surveillance program. It is not intended to diagnose MRSA infection nor to guide or monitor treatment for MRSA infections.          Radiology Studies: No results found.      Scheduled Meds: . aspirin EC  81 mg Oral Daily  . cefTAZidime (FORTAZ)  IV  1 g Intravenous Q8H  . docusate sodium  200 mg Oral QHS  . enoxaparin (LOVENOX) injection  40 mg Subcutaneous Daily  . feeding supplement (GLUCERNA SHAKE)  237 mL Oral TID BM  . fenofibrate  54 mg Oral Daily  . hydrocortisone sod succinate  (SOLU-CORTEF) inj  25 mg Intravenous Q12H  . insulin aspart  0-15 Units Subcutaneous TID WC  . insulin aspart  0-5 Units Subcutaneous QHS  . insulin glargine  50 Units Subcutaneous BID  . LORazepam  1 mg Intravenous Once  . mouth rinse  15 mL Mouth Rinse BID  . midodrine  10 mg Oral TID WC  . multivitamin with minerals  1 tablet Oral Daily  . pantoprazole  80 mg Oral Daily  . pregabalin  100 mg Oral TID  . sodium chloride flush  3 mL Intravenous Q12H  . vancomycin  500 mg Intravenous Q12H   Continuous Infusions: . sodium chloride 50 mL/hr (12/25/16 1621)     LOS: 3 days    Time spent: 50min    Domenic Polite, MD [name] Pager [phone]  If 7PM-7AM, please contact night-coverage [email or web address] Password [facility] 12/26/2016, 2:29 PM

## 2016-12-26 NOTE — Care Management Important Message (Signed)
Important Message  Patient Details  Name: [name] MRN: [id number] Date of Birth: 1966-06-02   Medicare Important Message Given:  Yes    Carles Collet, RN 12/26/2016, 3:31 PM

## 2016-12-26 NOTE — Progress Notes (Signed)
Subjective  -  No overnight changes   Physical Exam:  Right ankle ulcer Non-palpable right femoral pulse       Assessment/Plan:    The patient is well known to me, having undergone multiple procedures to improve her blood flow.  She has a chronic right ankle ulcer, and now we are having difficulty palpating a femoral pulse.  I would like to get a duplex u/s today to evaluate her blood-flow, and depending on what that shows, potentially proceed with angiography tomorrow  [name] 12/26/2016 12:44 PM --  Vitals:   12/25/16 2211 12/26/16 0517  BP: (!) 130/58 115/61  Pulse: 93 83  Resp: 18 20  Temp: 98.2 F (36.8 C) 98.1 F (36.7 C)    Intake/Output Summary (Last 24 hours) at 12/26/16 1244 Last data filed at 12/26/16 1655  Gross per 24 hour  Intake          3041.25 ml  Output             1000 ml  Net          2041.25 ml     Laboratory CBC    Component Value Date/Time   WBC 16.3 (H) 12/26/2016 0418   HGB 10.4 (L) 12/26/2016 0418   HCT 33.5 (L) 12/26/2016 0418   PLT 455 (H) 12/26/2016 0418    BMET    Component Value Date/Time   NA 140 12/26/2016 0418   K 3.0 (L) 12/26/2016 0418   CL 98 (L) 12/26/2016 0418   CO2 30 12/26/2016 0418   GLUCOSE 82 12/26/2016 0418   BUN 17 12/26/2016 0418   CREATININE 0.81 12/26/2016 0418   CALCIUM 9.0 12/26/2016 0418   GFRNONAA >60 12/26/2016 0418   GFRAA >60 12/26/2016 0418    COAG Lab Results  Component Value Date   INR 1.06 12/23/2016   INR 1.06 11/26/2016   INR 1.46 09/30/2016   No results found for: PTT  Antibiotics Anti-infectives    Start     Dose/Rate Route Frequency Ordered Stop   12/24/16 1300  cefTAZidime (FORTAZ) 1 g in dextrose 5 % 50 mL IVPB     1 g 100 mL/hr over 30 Minutes Intravenous Every 8 hours 12/24/16 1122     12/24/16 1130  fluconazole (DIFLUCAN) tablet 100 mg     100 mg Oral Daily 12/24/16 1129 12/26/16 0932   12/24/16 1000  vancomycin (VANCOCIN) 500 mg in sodium chloride 0.9  % 100 mL IVPB     500 mg 100 mL/hr over 60 Minutes Intravenous Every 12 hours 12/24/16 0306     12/24/16 0830  vancomycin (VANCOCIN) 500 mg in sodium chloride 0.9 % 100 mL IVPB  Status:  Discontinued     500 mg 100 mL/hr over 60 Minutes Intravenous Every 12 hours 12/23/16 1959 12/24/16 0250   12/24/16 0600  ceFEPIme (MAXIPIME) 1 g in dextrose 5 % 50 mL IVPB  Status:  Discontinued     1 g 100 mL/hr over 30 Minutes Intravenous Every 8 hours 12/24/16 0305 12/24/16 1122   12/24/16 0430  ceFEPIme (MAXIPIME) 1 g in dextrose 5 % 50 mL IVPB  Status:  Discontinued     1 g 100 mL/hr over 30 Minutes Intravenous Every 8 hours 12/23/16 1959 12/24/16 0250   12/24/16 0400  metroNIDAZOLE (FLAGYL) tablet 500 mg  Status:  Discontinued     500 mg Oral Every 8 hours 12/24/16 0250 12/24/16 1122   12/24/16 0015  fluconazole (DIFLUCAN) IVPB  100 mg  Status:  Discontinued     100 mg 50 mL/hr over 60 Minutes Intravenous Every 24 hours 12/24/16 0007 12/24/16 1129   12/23/16 1945  ceFEPIme (MAXIPIME) 2 g in dextrose 5 % 50 mL IVPB     2 g 100 mL/hr over 30 Minutes Intravenous  Once 12/23/16 1941 12/24/16 0130   12/23/16 1945  vancomycin (VANCOCIN) IVPB 1000 mg/200 mL premix     1,000 mg 200 mL/hr over 60 Minutes Intravenous  Once 12/23/16 1941 12/23/16 2225       V. Leia Alf, M.D. [facility] Office: [phone] Pager:  [phone]

## 2016-12-26 NOTE — Progress Notes (Signed)
  Subjective: Wound vac removed and she has some necrotic tissue in the corner at the 4 O'clock position.  No erythema and good granular base.  Continue wound care with Wound care center [facility].    Objective: Vital signs in last 24 hours: Temp:  [98.1 F (36.7 C)-98.2 F (36.8 C)] 98.1 F (36.7 C) (02/08 0517) Pulse Rate:  [83-93] 83 (02/08 0517) Resp:  [18-20] 20 (02/08 0517) BP: (115-136)/(58-62) 115/61 (02/08 0517) SpO2:  [95 %-100 %] 95 % (02/08 0517) Last BM Date: 12/24/16  Intake/Output from previous day: 02/07 0701 - 02/08 0700 In: 3041.3 [I.V.:2691.3; IV Piggyback:350] Out: 1000 [Urine:1000] Intake/Output this shift: No intake/output data recorded.  General appearance: alert, cooperative, no distress and anxious with dressing change. Skin: Skin color, texture, turgor normal. No rashes or lesions or open site good granular base, some white fibrous tissue slough in the 4 o'clock position.  this should come off in time.    Lab Results:   Recent Labs  12/24/16 0408 12/26/16 0418  WBC 13.2* 16.3*  HGB 10.4* 10.4*  HCT 32.7* 33.5*  PLT 399 455*    BMET  Recent Labs  12/25/16 0802 12/26/16 0418  NA 139 140  K 4.5 3.0*  CL 96* 98*  CO2 32 30  GLUCOSE 374* 82  BUN 12 17  CREATININE 0.74 0.81  CALCIUM 9.1 9.0   PT/INR  Recent Labs  12/23/16 1855  LABPROT 13.8  INR 1.06     Recent Labs Lab 12/23/16 1855  AST 14*  ALT 8*  ALKPHOS 85  BILITOT 0.4  PROT 7.5  ALBUMIN 3.0*     Lipase     Component Value Date/Time   LIPASE 18 09/30/2016 1426     Studies/Results: No results found.  Medications: . aspirin EC  81 mg Oral Daily  . cefTAZidime (FORTAZ)  IV  1 g Intravenous Q8H  . docusate sodium  200 mg Oral QHS  . enoxaparin (LOVENOX) injection  40 mg Subcutaneous Daily  . feeding supplement (GLUCERNA SHAKE)  237 mL Oral TID BM  . fenofibrate  54 mg Oral Daily  . hydrocortisone sod succinate (SOLU-CORTEF) inj  25 mg Intravenous Q12H  .  insulin aspart  0-15 Units Subcutaneous TID WC  . insulin aspart  0-5 Units Subcutaneous QHS  . insulin glargine  50 Units Subcutaneous BID  . LORazepam  1 mg Intravenous Once  . mouth rinse  15 mL Mouth Rinse BID  . midodrine  10 mg Oral TID WC  . multivitamin with minerals  1 tablet Oral Daily  . pantoprazole  80 mg Oral Daily  . pregabalin  100 mg Oral TID  . sodium chloride flush  3 mL Intravenous Q12H  . vancomycin  500 mg Intravenous Q12H    Assessment/Plan Hypotension with possible sepsis Hx of necrotizing fasciitis of the right buttocks; status post debridement 09/30/16,Dr. Georganna Skeans Chronic back and neck pain - chronic pain management with oxycodone, she does not know dose. Type 2 diabetes Asthma Ongoing tobacco use started age 51 PAD/status post right fem-tib below-knee popliteal bypass grafting  Plan:  Continue wound vac.  Follow up with [facility] and Dr. Nils Pyle.       LOS: 3 days    [name] 12/26/2016 [phone]

## 2016-12-26 NOTE — Progress Notes (Addendum)
*  PRELIMINARY RESULTS* Vascular Ultrasound Right lower extremity arterial duplex has been completed.  Preliminary findings:   Patent right femoral popliteal bypass graft. 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 Possible elevated velocities at distal right iliac 50-74% stenosis.     Paged Dr. Trula Slade at 3:30 pm with no call back.      Landry Mellow, RDMS, RVT  12/26/2016, 3:36 PM

## 2016-12-26 NOTE — Progress Notes (Signed)
Pharmacy Antibiotic Note  [name] is a 51 y.o. female admitted on 12/23/2016 with osteomyelitis.  Pt presents from clinic hypotensive and has PMH of chronic buttock wound with recent ED visit for debridement of wound January 2018. Hx of necrotizing fasciitis of the right buttocks; status post debridement 09/30/16.  Pharmacy has been consulted for vancomycin and cefepime dosing. Pt has noted PCN allergy but appears to have tolerated cefepime in the past. MRI consistent with osteomyelitis.  SCr stable, WBC elevated (on steroids), afeb  Plan: -Continue Vancomycin 500mg  IV q12h -Continue Cefepime 1g IV q8h -F/U LOT, cultures, VT  Height: 5\' 6"  (167.6 cm) Weight: 144 lb 10 oz (65.6 kg) IBW/kg (Calculated) : 59.3  Temp (24hrs), Avg:98.1 F (36.7 C), Min:98.1 F (36.7 C), Max:98.2 F (36.8 C)   Recent Labs Lab 12/23/16 1855 12/23/16 1914 12/23/16 2329 12/24/16 0154 12/24/16 0407 12/24/16 0408 12/24/16 1139 12/25/16 0802 12/26/16 0418  WBC 16.9*  --   --   --   --  13.2*  --   --  16.3*  CREATININE 1.10*  --   --   --   --  0.75  --  0.74 0.81  LATICACIDVEN  --  4.06* 2.01* 2.0* 1.2  --  0.9  --   --     Estimated Creatinine Clearance: 76.1 mL/min (by C-G formula based on SCr of 0.81 mg/dL).    Allergies  Allergen Reactions  . Penicillins Other (See Comments)    Severe Headache (not so sure about this now) Has patient had a PCN reaction causing immediate rash, facial/tongue/throat swelling, SOB or lightheadedness with hypotension: No Has patient had a PCN reaction causing severe rash involving mucus membranes or skin necrosis: No Has patient had a PCN reaction that required hospitalization: No Has patient had a PCN reaction occurring within the last 10 years: No If all of the above answers are "NO", then may proceed with Cephalosporin use.     Antimicrobials this admission: 2/6 Ceftazidime >> 2/5 Vanco >> 2/5 Fluconazole x3 doses (2/8) 2/5 Cefepime >> 2/6 2/5  Flagyl >> 2/6   Dose adjustments this admission: none  Microbiology results: 2/5 BCx: ngtd 2/5 UCx: suggests recollection  2/6 MRSA PCR: neg   Thank you for allowing [place] to participate in this patients care.  Jens Som, PharmD Clinical phone for 12/26/2016 from 7a-3:30p: x [phone] If after 3:30p, please call main pharmacy at: [phone] 12/26/2016 3:20 PM

## 2016-12-27 ENCOUNTER — Encounter (HOSPITAL_COMMUNITY)
Admission: EM | Disposition: A | Discharge status: Home Health | Payer: Self-pay | Source: Home / Self Care | Attending: Internal Medicine

## 2016-12-27 LAB — GLUCOSE, CAPILLARY
GLUCOSE-CAPILLARY: 57 mg/dL — AB (ref 65–99)
GLUCOSE-CAPILLARY: 136 mg/dL — AB (ref 65–99)
GLUCOSE-CAPILLARY: 162 mg/dL — AB (ref 65–99)
Glucose-Capillary: 153 mg/dL — ABNORMAL HIGH (ref 65–99)
Glucose-Capillary: 136 mg/dL — ABNORMAL HIGH (ref 65–99)

## 2016-12-27 LAB — VANCOMYCIN, TROUGH
VANCOMYCIN TR: 14 ug/mL — AB (ref 15–20)
Vancomycin Tr: 29 ug/mL (ref 15–20)

## 2016-12-27 SURGERY — INVASIVE LAB ABORTED CASE

## 2016-12-27 MED ORDER — INSULIN GLARGINE 100 UNIT/ML ~~LOC~~ SOLN
40.0000 [IU] | Freq: Two times a day (BID) | SUBCUTANEOUS | Status: DC
  Administered 2016-12-27 – 2017-01-01 (×10): 40 [IU] via SUBCUTANEOUS
  Filled 2016-12-27 (×11): qty 0.4

## 2016-12-27 MED ORDER — VANCOMYCIN HCL IN DEXTROSE 750-5 MG/150ML-% IV SOLN
750.0000 mg | Freq: Two times a day (BID) | INTRAVENOUS | Status: DC
  Administered 2016-12-27 – 2016-12-29 (×4): 750 mg via INTRAVENOUS
  Filled 2016-12-27 (×4): qty 150

## 2016-12-27 SURGICAL SUPPLY — 10 items
CATH OMNI FLUSH 5F 65CM (CATHETERS) ×3 IMPLANT
COVER PRB 48X5XTLSCP FOLD TPE (BAG) ×1 IMPLANT
COVER PROBE 5X48 (BAG) ×4
KIT PV (KITS) ×4 IMPLANT
SHEATH PINNACLE 5F 10CM (SHEATH) ×3 IMPLANT
SYR MEDRAD MARK V 150ML (SYRINGE) ×4 IMPLANT
TRANSDUCER W/STOPCOCK (MISCELLANEOUS) ×4 IMPLANT
TRAY PV CATH (CUSTOM PROCEDURE TRAY) ×4 IMPLANT
WIRE BENTSON .035X145CM (WIRE) ×3 IMPLANT
WIRE MINI STICK MAX (SHEATH) ×3 IMPLANT

## 2016-12-27 NOTE — Progress Notes (Signed)
While rounding on patient this morning RN noticed she was sweating.  Pt was alert and oriented and communicating.  Temperature within normal limit.  CBG check was 57.  Orange juice given and pt requested for pudding.  A CBG recheck was

## 2016-12-27 NOTE — Progress Notes (Signed)
Pharmacy Antibiotic Note  [name] is a 51 y.o. female admitted on 12/23/2016 with osteomyelitis.  Pt presents from clinic hypotensive and has PMH of chronic buttock wound with recent ED visit for debridement of wound January 2018. Hx of necrotizing fasciitis of the right buttocks; status post debridement 09/30/16.  Pharmacy has been consulted for vancomycin and cefepime dosing. Pt has noted PCN allergy but appears to have tolerated cefepime in the past. MRI consistent with osteomyelitis.  Vancomycin trough drawn this morning is supratherapeutic at 29 mg/ml on vancomycin 500 mg q12h however level was drawn inappropriately after dose was already infusing.  Will continue current regimen and recheck vancomycin trough.  Scr stable, WBC elevated at 16.3 K/uL, afebrile  Plan: -Continue Vancomycin 500mg  IV q12h -Continue Cefepime 1g IV q8h -F/U LOT, cultures, VT  Height: 5\' 6"  (167.6 cm) Weight: 144 lb 10 oz (65.6 kg) IBW/kg (Calculated) : 59.3  Temp (24hrs), Avg:97.8 F (36.6 C), Min:97.8 F (36.6 C), Max:97.8 F (36.6 C)   Recent Labs Lab 12/23/16 1855 12/23/16 1914 12/23/16 2329 12/24/16 0154 12/24/16 0407 12/24/16 0408 12/24/16 1139 12/25/16 0802 12/26/16 0418 12/27/16 0924  WBC 16.9*  --   --   --   --  13.2*  --   --  16.3*  --   CREATININE 1.10*  --   --   --   --  0.75  --  0.74 0.81  --   LATICACIDVEN  --  4.06* 2.01* 2.0* 1.2  --  0.9  --   --   --   VANCOTROUGH  --   --   --   --   --   --   --   --   --  29*    Estimated Creatinine Clearance: 76.1 mL/min (by C-G formula based on SCr of 0.81 mg/dL).    Allergies  Allergen Reactions  . Penicillins Other (See Comments)    Severe Headache (not so sure about this now) Has patient had a PCN reaction causing immediate rash, facial/tongue/throat swelling, SOB or lightheadedness with hypotension: No Has patient had a PCN reaction causing severe rash involving mucus membranes or skin necrosis: No Has patient had a PCN  reaction that required hospitalization: No Has patient had a PCN reaction occurring within the last 10 years: No If all of the above answers are "NO", then may proceed with Cephalosporin use.     Antimicrobials this admission: 2/6 Ceftazidime >> 2/5 Vanco >> 2/5 Fluconazole x3 doses (2/8) 2/5 Cefepime >> 2/6 2/5 Flagyl >> 2/6   Dose adjustments this admission: none  Microbiology results: 2/5 BCx: ngtd 2/5 UCx: suggests recollection, multiple species 2/6 MRSA PCR: neg   Danella Penton, PharmD Student Clinical phone for 12/27/2016 from 7a-3:30p: x [phone] If after 3:30p, please call main pharmacy at: [phone] 12/27/2016 10:40 AM

## 2016-12-27 NOTE — Progress Notes (Signed)
PROGRESS NOTE    [name]  BZJ:[id number] DOB: [birth year] DOA: 12/23/2016 PCP: [name], MD  Brief Narrative: Patient is a 51 year old female with a history of hypertension, chronic pain, tobacco abuse type 2 diabetes, right lower extremity lateral malleolus eschars who was admitted 1113/17 with necrotizing fasciitis. She underwent debridement of the right buttocks wound. It was 16 x 11 x 3 cm deep. During that hospitalization she had an NSTEMI, and right-sided weakness with stroke like symptoms. She was ultimately discharged and was being followed by the wound care clinic and Dr. Nils Pyle. She returned on 11/27/16 with hypotension and dizziness. Was re admitted with hypotension  BP in the 60's; with possible sepsis on 11/27/16.  She was reevaluated on 110/18 by Dr. Donnie Mesa. He recommended ongoing hydrotherapy and local wound care. Bp soft at baseline Also has R leg wound/chronic Transferred from PCCM to [facility] 2/7   Assessment & Plan: 1. Hypotension with possible sepsis -improved with fluid resuscitation, pressors and empiric Abx -Blood Cx negative, urine cx polymicrobial -per CCS -sacral wound unlikley to be source of sepsis -has R ankle wound- MRI R ankle concerning for cellulitis about the lateral ankle with a soft tissue wound overlying the lateral malleolus. Edema and enhancement in the distal 4.5-5 cm of the fibula are consistent with osteomyelitis, has severe PAD with ABI of 0.45, followed by Dr.Brabham, appreciate VVS input, possible angiogram planned -resumed home midodrine -cut down Hydrocortisone, no evidence of AI, random cortisol 11.3 -continue IV Vanc and Fortaz Day 3  2. Hx of necrotizing fasciitis of the right buttocks; status post debridement 09/30/16,Dr. Georganna Skeans -appreciate CCS input, wound appears to be healing well -continue wound care, followed by [facility] center in [place]  3. Chronic back and neck pain - - chronic pain management , vicodin  4.  Type 2 diabetes -with hypoglycemia this am -DM uncontrolled, cut down lantus, SSI  5. COPD/ tobacco use started age 24 -stable, nebs PRN  6. PAD/status post right fem-tib below-knee popliteal bypass grafting -with ischemic ulcer R leg, VVS consulted as above  DVT prophylaxis:Lovenox Code Status:Full Code Family Communication:None at bedside Disposition Plan:Pending vascular/Osteo workup   Consultants:   Vascular   Procedures:  Antimicrobials: Vanc/Fortaz  Subjective: Feels ok, bottom ok, R leg without much pain now  Objective: Vitals:   12/26/16 1413 12/26/16 2213 12/27/16 0051 12/27/16 0423  BP: 132/67 (!) 70/43 (!) 84/56 (!) 102/57  Pulse: 90 84 78 87  Resp: 19 18 18 18   Temp:    97.8 F (36.6 C)  TempSrc:    Oral  SpO2: 99% 100% 100% 93%  Weight:      Height:        Intake/Output Summary (Last 24 hours) at 12/27/16 1246 Last data filed at 12/27/16 0700  Gross per 24 hour  Intake           780.83 ml  Output               51 ml  Net           729.83 ml   Filed Weights   12/24/16 0300  Weight: 65.6 kg (144 lb 10 oz)    Examination:  General exam: Appears calm and comfortable, AAOx3, no distress Respiratory system: Clear to auscultation. Respiratory effort normal. Cardiovascular system: S1 & S2 heard, RRR. No JVD, murmurs, rubs, gallops or clicks. No pedal edema. Gastrointestinal system: Abdomen is nondistended, soft and nontender. Normal bowel sounds heard, large sacral  decub with pink gram tissue and small eschar/purulence at one edge Central nervous system: Alert and oriented. No focal neurological deficits. Extremities: R leg wound with deep ulcer with purulence near ankle, mild surrounding erythema Skin: No rashes, lesions or ulcers Psychiatry: Judgement and insight appear normal. Mood & affect appropriate.     Data Reviewed: I have personally reviewed following labs and imaging studies  CBC:  Recent Labs Lab 12/23/16 1855 12/24/16 0408  12/26/16 0418  WBC 16.9* 13.2* 16.3*  NEUTROABS 12.4*  --   --   HGB 13.2 10.4* 10.4*  HCT 39.9 32.7* 33.5*  MCV 80.8 81.1 82.5  PLT 440* 399 096*   Basic Metabolic Panel:  Recent Labs Lab 12/23/16 1855 12/24/16 0408 12/25/16 0802 12/26/16 0418  NA 130* 138 139 140  K 3.7 3.2* 4.5 3.0*  CL 84* 99* 96* 98*  CO2 30 28 32 30  GLUCOSE 357* 344* 374* 82  BUN 25* 15 12 17   CREATININE 2.83* 0.75 0.74 0.81  CALCIUM 9.9 8.6* 9.1 9.0   GFR: Estimated Creatinine Clearance: 76.1 mL/min (by C-G formula based on SCr of 0.81 mg/dL). Liver Function Tests:  Recent Labs Lab 12/23/16 1855  AST 14*  ALT 8*  ALKPHOS 85  BILITOT 0.4  PROT 7.5  ALBUMIN 3.0*   No results for input(s): LIPASE, AMYLASE in the last 168 hours. No results for input(s): AMMONIA in the last 168 hours. Coagulation Profile:  Recent Labs Lab 12/23/16 1855  INR 1.06   Cardiac Enzymes: No results for input(s): CKTOTAL, CKMB, CKMBINDEX, TROPONINI in the last 168 hours. BNP (last 3 results) No results for input(s): PROBNP in the last 8760 hours. HbA1C: No results for input(s): HGBA1C in the last 72 hours. CBG:  Recent Labs Lab 12/26/16 1214 12/26/16 1656 12/26/16 2206 12/27/16 0635 12/27/16 0733  GLUCAP 102* 79 122* 57* 136*   Lipid Profile: No results for input(s): CHOL, HDL, LDLCALC, TRIG, CHOLHDL, LDLDIRECT in the last 72 hours. Thyroid Function Tests: No results for input(s): TSH, T4TOTAL, FREET4, T3FREE, THYROIDAB in the last 72 hours. Anemia Panel: No results for input(s): VITAMINB12, FOLATE, FERRITIN, TIBC, IRON, RETICCTPCT in the last 72 hours. Urine analysis:    Component Value Date/Time   COLORURINE STRAW (A) 12/23/2016 2221   APPEARANCEUR CLEAR 12/23/2016 2221   LABSPEC 1.002 (L) 12/23/2016 2221   PHURINE 6.0 12/23/2016 2221   GLUCOSEU >=500 (A) 12/23/2016 2221   HGBUR NEGATIVE 12/23/2016 2221   BILIRUBINUR NEGATIVE 12/23/2016 2221   KETONESUR NEGATIVE 12/23/2016 2221    PROTEINUR NEGATIVE 12/23/2016 2221   UROBILINOGEN 0.2 06/25/2011 1707   NITRITE NEGATIVE 12/23/2016 2221   LEUKOCYTESUR NEGATIVE 12/23/2016 2221   Sepsis Labs: @LABRCNTIP (procalcitonin:4,lacticidven:4)  ) Recent Results (from the past 240 hour(s))  Culture, blood (Routine x 2)     Status: None (Preliminary result)   Collection Time: 12/23/16  6:55 PM  Result Value Ref Range Status   Specimen Description BLOOD RIGHT FOREARM  Final   Special Requests IN PEDIATRIC BOTTLE 2CC  Final   Culture NO GROWTH 3 DAYS  Final   Report Status PENDING  Incomplete  Culture, blood (Routine x 2)     Status: None (Preliminary result)   Collection Time: 12/23/16  8:20 PM  Result Value Ref Range Status   Specimen Description BLOOD RIGHT HAND  Final   Special Requests BOTTLES DRAWN AEROBIC AND ANAEROBIC 5CC  Final   Culture NO GROWTH 3 DAYS  Final   Report Status PENDING  Incomplete  Urine culture     Status: Abnormal   Collection Time: 12/23/16 10:21 PM  Result Value Ref Range Status   Specimen Description URINE, RANDOM  Final   Special Requests NONE  Final   Culture MULTIPLE SPECIES PRESENT, SUGGEST RECOLLECTION (A)  Final   Report Status 12/25/2016 FINAL  Final  MRSA PCR Screening     Status: None   Collection Time: 12/24/16  4:19 AM  Result Value Ref Range Status   MRSA by PCR NEGATIVE NEGATIVE Final    Comment:        The GeneXpert MRSA Assay (FDA approved for NASAL specimens only), is one component of a comprehensive MRSA colonization surveillance program. It is not intended to diagnose MRSA infection nor to guide or monitor treatment for MRSA infections.          Radiology Studies: No results found.      Scheduled Meds: . aspirin EC  81 mg Oral Daily  . cefTAZidime (FORTAZ)  IV  1 g Intravenous Q8H  . docusate sodium  200 mg Oral QHS  . enoxaparin (LOVENOX) injection  40 mg Subcutaneous Daily  . feeding supplement (GLUCERNA SHAKE)  237 mL Oral TID BM  . fenofibrate   54 mg Oral Daily  . insulin aspart  0-15 Units Subcutaneous TID WC  . insulin aspart  0-5 Units Subcutaneous QHS  . insulin glargine  40 Units Subcutaneous BID  . LORazepam  1 mg Intravenous Once  . mouth rinse  15 mL Mouth Rinse BID  . midodrine  10 mg Oral TID WC  . multivitamin with minerals  1 tablet Oral Daily  . pantoprazole  80 mg Oral Daily  . pregabalin  100 mg Oral TID  . sodium chloride flush  3 mL Intravenous Q12H  . vancomycin  500 mg Intravenous Q12H   Continuous Infusions: . sodium chloride 50 mL/hr at 12/26/16 1559     LOS: 4 days    Time spent: 68min    Domenic Polite, MD [name] Pager [phone]  If 7PM-7AM, please contact night-coverage [email or web address] Password [email or web address] 12/27/2016, 12:46 PM

## 2016-12-27 NOTE — Progress Notes (Signed)
Pharmacy Antibiotic Note  [name] is a 51 y.o. female admitted on 12/23/2016 with osteomyelitis.  Pt presents from clinic hypotensive and has PMH of chronic buttock wound with recent ED visit for debridement of wound January 2018. Hx of necrotizing fasciitis of the right buttocks; status post debridement 09/30/16.  Pharmacy was consulted on 12/25/16  for vancomycin and cefepime dosing. MRI consistent with osteomyelitis.  SCr 0.81 on 12/27/15, stable, WBC elevated (on steroids), afeb Vancomycin Trough tonight = 14 mcg/ml   Plan: Increase Vancomycin to 750mg  IV q12h -Continue Cefepime 1g IV q8h -F/U LOT, cultures, VT  Height: 5\' 6"  (167.6 cm) Weight: 144 lb 10 oz (65.6 kg) IBW/kg (Calculated) : 59.3  Temp (24hrs), Avg:98.1 F (36.7 C), Min:97.8 F (36.6 C), Max:98.4 F (36.9 C)   Recent Labs Lab 12/23/16 1855 12/23/16 1914 12/23/16 2329 12/24/16 0154 12/24/16 0407 12/24/16 0408 12/24/16 1139 12/25/16 0802 12/26/16 0418 12/27/16 0924 12/27/16 2057  WBC 16.9*  --   --   --   --  13.2*  --   --  16.3*  --   --   CREATININE 1.10*  --   --   --   --  0.75  --  0.74 0.81  --   --   LATICACIDVEN  --  4.06* 2.01* 2.0* 1.2  --  0.9  --   --   --   --   VANCOTROUGH  --   --   --   --   --   --   --   --   --  29* 14*    Estimated Creatinine Clearance: 76.1 mL/min (by C-G formula based on SCr of 0.81 mg/dL).    Allergies  Allergen Reactions  . Penicillins Other (See Comments)    Severe Headache (not so sure about this now) Has patient had a PCN reaction causing immediate rash, facial/tongue/throat swelling, SOB or lightheadedness with hypotension: No Has patient had a PCN reaction causing severe rash involving mucus membranes or skin necrosis: No Has patient had a PCN reaction that required hospitalization: No Has patient had a PCN reaction occurring within the last 10 years: No If all of the above answers are "NO", then may proceed with Cephalosporin use.     Antimicrobials  this admission: 2/6 Ceftazidime >> 2/5 Vanco >> 2/5 Fluconazole x3 doses (2/8) 2/5 Cefepime >> 2/6 2/5 Flagyl >> 2/6   Dose adjustments this admission: none  Microbiology results: 2/5 BCx: ngtd 2/5 UCx: suggests recollection  2/6 MRSA PCR: neg   Thank you for allowing [place] to participate in this patients care.  Jens Som, PharmD Clinical phone for 12/27/2016 from 7a-3:30p: [phone] If after 3:30p, please call main pharmacy at: [phone] 12/27/2016 10:00 PM

## 2016-12-28 LAB — CULTURE, BLOOD (ROUTINE X 2)
CULTURE: NO GROWTH
CULTURE: NO GROWTH

## 2016-12-28 LAB — GLUCOSE, CAPILLARY
GLUCOSE-CAPILLARY: 126 mg/dL — AB (ref 65–99)
GLUCOSE-CAPILLARY: 90 mg/dL (ref 65–99)
Glucose-Capillary: 117 mg/dL — ABNORMAL HIGH (ref 65–99)
Glucose-Capillary: 117 mg/dL — ABNORMAL HIGH (ref 65–99)
Glucose-Capillary: 148 mg/dL — ABNORMAL HIGH (ref 65–99)

## 2016-12-28 LAB — BASIC METABOLIC PANEL
ANION GAP: 10 (ref 5–15)
BUN: 21 mg/dL — AB (ref 6–20)
CO2: 27 mmol/L (ref 22–32)
Calcium: 9.4 mg/dL (ref 8.9–10.3)
Chloride: 103 mmol/L (ref 101–111)
Creatinine, Ser: 0.57 mg/dL (ref 0.44–1.00)
GFR calc Af Amer: >60 mL/min (ref >60)
Glucose, Bld: 120 mg/dL — ABNORMAL HIGH (ref 65–99)
POTASSIUM: 4.4 mmol/L (ref 3.5–5.1)
Sodium: 140 mmol/L (ref 135–145)

## 2016-12-28 NOTE — Progress Notes (Signed)
PROGRESS NOTE    [name]  HBZ:[id number] DOB: Apr 02, 1966 DOA: 12/23/2016 PCP: [name], MD  Brief Narrative: Patient is a 51 year old female with a history of hypertension, chronic pain, tobacco abuse type 2 diabetes, right lower extremity lateral malleolus eschars who was admitted 1113/17 with necrotizing fasciitis. She underwent debridement of the right buttocks wound. It was 16 x 11 x 3 cm deep. During that hospitalization she had an NSTEMI, and right-sided weakness with stroke like symptoms. She was ultimately discharged and was being followed by the wound care clinic and Dr. Nils Pyle. She returned on 11/27/16 with hypotension and dizziness. Was re admitted with hypotension  BP in the 60's; with possible sepsis on 11/27/16.  She was reevaluated on 110/18 by Dr. Donnie Mesa. He recommended ongoing hydrotherapy and local wound care. Bp soft at baseline Severe PAD with R leg wound/chronic followed by Dr.Brabham Transferred from PCCM to [facility] 2/7 VVS consulting planning arteriogram  Assessment & Plan: 1. Hypotension with possible sepsis -improved with fluid resuscitation, pressors and empiric Abx -Blood Cx negative, urine cx polymicrobial -per CCS -sacral wound unlikley to be source of sepsis -has R ankle wound- MRI R ankle concerning for cellulitis about the lateral ankle with a soft tissue wound overlying the lateral malleolus. Edema and enhancement in the distal 4.5-5 cm of the fibula are consistent with osteomyelitis, has severe PAD with ABI of 0.45, followed by Dr.Brabham, appreciate VVS input, possible angiogram planned -resumed home midodrine -cut down and stopped Hydrocortisone, no evidence of AI, random cortisol 11.3 -continue IV Vanc and Fortaz Day 4, will d/w ID regarding this  2. Hx of necrotizing fasciitis of the right buttocks; status post debridement 09/30/16,Dr. Georganna Skeans -appreciate CCS input, wound appears to be healing well -continue wound care, followed by  [facility] at Wound center in [place]  3. Chronic back and neck pain - - chronic pain management , vicodin  4. Type 2 diabetes -with hypoglycemia 2/9 am -DM uncontrolled, cut down lantus, SSI  5. COPD/ tobacco use started age 50 -stable, nebs PRN  6. PAD/status post right fem-tib below-knee popliteal bypass grafting -with ischemic ulcer R leg, VVS consulted as above  DVT prophylaxis:Lovenox Code Status:Full Code Family Communication:None at bedside Disposition Plan:Pending vascular/Osteo workup   Consultants:   Vascular   Procedures:  Antimicrobials: Vanc/Fortaz  Subjective: Feels ok, bottom ok, R leg without much pain now, anxious to know abt angio  Objective: Vitals:   12/27/16 2110 12/28/16 0307 12/28/16 0456 12/28/16 0500  BP: (!) 84/58 (!) 93/53 (!) 77/43 (!) 82/52  Pulse: 82 88 81   Resp:  18 19   Temp:   98.2 F (36.8 C)   TempSrc:      SpO2:   99%   Weight:      Height:        Intake/Output Summary (Last 24 hours) at 12/28/16 1138 Last data filed at 12/28/16 0300  Gross per 24 hour  Intake             2150 ml  Output                0 ml  Net             2150 ml   Filed Weights   12/24/16 0300  Weight: 65.6 kg (144 lb 10 oz)    Examination:  General exam: Appears calm and comfortable, AAOx3, no distress Respiratory system: Clear to auscultation. Respiratory effort normal. Cardiovascular system: S1 & S2 heard,  RRR. No JVD, murmurs, rubs, gallops or clicks. No pedal edema. Gastrointestinal system: Abdomen is nondistended, soft and nontender. Normal bowel sounds heard, large sacral decub with pink gram tissue and small eschar/purulence at one edge Central nervous system: Alert and oriented. No focal neurological deficits. Extremities: R leg wound with deep ulcer with purulence near ankle, mild surrounding erythema Skin: No rashes, lesions or ulcers Psychiatry: Judgement and insight appear normal. Mood & affect appropriate.     Data Reviewed: I  have personally reviewed following labs and imaging studies  CBC:  Recent Labs Lab 12/23/16 1855 12/24/16 0408 12/26/16 0418  WBC 16.9* 13.2* 16.3*  NEUTROABS 12.4*  --   --   HGB 13.2 10.4* 10.4*  HCT 39.9 32.7* 33.5*  MCV 80.8 81.1 82.5  PLT 440* 399 893*   Basic Metabolic Panel:  Recent Labs Lab 12/23/16 1855 12/24/16 0408 12/25/16 0802 12/26/16 0418 12/28/16 1011  NA 130* 138 139 140 140  K 3.7 3.2* 4.5 3.0* 4.4  CL 84* 99* 96* 98* 103  CO2 30 28 32 30 27  GLUCOSE 357* 344* 374* 82 120*  BUN 25* 15 12 17  21*  CREATININE 1.10* 0.75 0.74 0.81 0.57  CALCIUM 9.9 8.6* 9.1 9.0 9.4   GFR: Estimated Creatinine Clearance: 77 mL/min (by C-G formula based on SCr of 0.57 mg/dL). Liver Function Tests:  Recent Labs Lab 12/23/16 1855  AST 14*  ALT 8*  ALKPHOS 85  BILITOT 0.4  PROT 7.5  ALBUMIN 3.0*   No results for input(s): LIPASE, AMYLASE in the last 168 hours. No results for input(s): AMMONIA in the last 168 hours. Coagulation Profile:  Recent Labs Lab 12/23/16 1855  INR 1.06   Cardiac Enzymes: No results for input(s): CKTOTAL, CKMB, CKMBINDEX, TROPONINI in the last 168 hours. BNP (last 3 results) No results for input(s): PROBNP in the last 8760 hours. HbA1C: No results for input(s): HGBA1C in the last 72 hours. CBG:  Recent Labs Lab 12/27/16 0733 12/27/16 1455 12/27/16 1701 12/27/16 2049 12/28/16 0840  GLUCAP 136* 162* 153* 136* 117*   Lipid Profile: No results for input(s): CHOL, HDL, LDLCALC, TRIG, CHOLHDL, LDLDIRECT in the last 72 hours. Thyroid Function Tests: No results for input(s): TSH, T4TOTAL, FREET4, T3FREE, THYROIDAB in the last 72 hours. Anemia Panel: No results for input(s): VITAMINB12, FOLATE, FERRITIN, TIBC, IRON, RETICCTPCT in the last 72 hours. Urine analysis:    Component Value Date/Time   COLORURINE STRAW (A) 12/23/2016 2221   APPEARANCEUR CLEAR 12/23/2016 2221   LABSPEC 1.002 (L) 12/23/2016 2221   PHURINE 6.0  12/23/2016 2221   GLUCOSEU >=500 (A) 12/23/2016 2221   HGBUR NEGATIVE 12/23/2016 2221   BILIRUBINUR NEGATIVE 12/23/2016 2221   KETONESUR NEGATIVE 12/23/2016 2221   PROTEINUR NEGATIVE 12/23/2016 2221   UROBILINOGEN 0.2 06/25/2011 1707   NITRITE NEGATIVE 12/23/2016 2221   LEUKOCYTESUR NEGATIVE 12/23/2016 2221   Sepsis Labs: @LABRCNTIP (procalcitonin:4,lacticidven:4)  ) Recent Results (from the past 240 hour(s))  Culture, blood (Routine x 2)     Status: None   Collection Time: 12/23/16  6:55 PM  Result Value Ref Range Status   Specimen Description BLOOD RIGHT FOREARM  Final   Special Requests IN PEDIATRIC BOTTLE 2CC  Final   Culture NO GROWTH 5 DAYS  Final   Report Status 12/28/2016 FINAL  Final  Culture, blood (Routine x 2)     Status: None   Collection Time: 12/23/16  8:20 PM  Result Value Ref Range Status   Specimen Description BLOOD RIGHT  HAND  Final   Special Requests BOTTLES DRAWN AEROBIC AND ANAEROBIC 5CC  Final   Culture NO GROWTH 5 DAYS  Final   Report Status 12/28/2016 FINAL  Final  Urine culture     Status: Abnormal   Collection Time: 12/23/16 10:21 PM  Result Value Ref Range Status   Specimen Description URINE, RANDOM  Final   Special Requests NONE  Final   Culture MULTIPLE SPECIES PRESENT, SUGGEST RECOLLECTION (A)  Final   Report Status 12/25/2016 FINAL  Final  MRSA PCR Screening     Status: None   Collection Time: 12/24/16  4:19 AM  Result Value Ref Range Status   MRSA by PCR NEGATIVE NEGATIVE Final    Comment:        The GeneXpert MRSA Assay (FDA approved for NASAL specimens only), is one component of a comprehensive MRSA colonization surveillance program. It is not intended to diagnose MRSA infection nor to guide or monitor treatment for MRSA infections.          Radiology Studies: No results found.      Scheduled Meds: . aspirin EC  81 mg Oral Daily  . cefTAZidime (FORTAZ)  IV  1 g Intravenous Q8H  . docusate sodium  200 mg Oral QHS  .  enoxaparin (LOVENOX) injection  40 mg Subcutaneous Daily  . feeding supplement (GLUCERNA SHAKE)  237 mL Oral TID BM  . fenofibrate  54 mg Oral Daily  . insulin aspart  0-15 Units Subcutaneous TID WC  . insulin aspart  0-5 Units Subcutaneous QHS  . insulin glargine  40 Units Subcutaneous BID  . LORazepam  1 mg Intravenous Once  . mouth rinse  15 mL Mouth Rinse BID  . midodrine  10 mg Oral TID WC  . multivitamin with minerals  1 tablet Oral Daily  . pantoprazole  80 mg Oral Daily  . pregabalin  100 mg Oral TID  . sodium chloride flush  3 mL Intravenous Q12H  . vancomycin  750 mg Intravenous Q12H   Continuous Infusions: . sodium chloride 50 mL/hr at 12/26/16 1559     LOS: 5 days    Time spent: 18min    Domenic Polite, MD [name] Pager [phone]  If 7PM-7AM, please contact night-coverage [email or web address] Password [email or web address] 12/28/2016, 11:38 AM

## 2016-12-28 NOTE — Progress Notes (Signed)
Angiogram cancelled yesterday. Will re-schedule for Tuesday.  If patient ready for discharged, this can be done as an outpatient  [name]

## 2016-12-29 DIAGNOSIS — Z880 Allergy status to penicillin: Secondary | ICD-10-CM

## 2016-12-29 DIAGNOSIS — K08409 Partial loss of teeth, unspecified cause, unspecified class: Secondary | ICD-10-CM

## 2016-12-29 DIAGNOSIS — B9689 Other specified bacterial agents as the cause of diseases classified elsewhere: Secondary | ICD-10-CM

## 2016-12-29 DIAGNOSIS — E1151 Type 2 diabetes mellitus with diabetic peripheral angiopathy without gangrene: Secondary | ICD-10-CM

## 2016-12-29 DIAGNOSIS — Z833 Family history of diabetes mellitus: Secondary | ICD-10-CM

## 2016-12-29 DIAGNOSIS — Z978 Presence of other specified devices: Secondary | ICD-10-CM

## 2016-12-29 DIAGNOSIS — M869 Osteomyelitis, unspecified: Secondary | ICD-10-CM | POA: Diagnosis present

## 2016-12-29 DIAGNOSIS — F17210 Nicotine dependence, cigarettes, uncomplicated: Secondary | ICD-10-CM

## 2016-12-29 DIAGNOSIS — M86671 Other chronic osteomyelitis, right ankle and foot: Secondary | ICD-10-CM

## 2016-12-29 DIAGNOSIS — Z8249 Family history of ischemic heart disease and other diseases of the circulatory system: Secondary | ICD-10-CM

## 2016-12-29 LAB — GLUCOSE, CAPILLARY
GLUCOSE-CAPILLARY: 108 mg/dL — AB (ref 65–99)
GLUCOSE-CAPILLARY: 108 mg/dL — AB (ref 65–99)
GLUCOSE-CAPILLARY: 170 mg/dL — AB (ref 65–99)
GLUCOSE-CAPILLARY: 163 mg/dL — AB (ref 65–99)
Glucose-Capillary: 225 mg/dL — ABNORMAL HIGH (ref 65–99)

## 2016-12-29 MED ORDER — FUROSEMIDE 20 MG PO TABS
20.0000 mg | ORAL_TABLET | Freq: Every day | ORAL | Status: AC
  Administered 2016-12-29: 20 mg via ORAL
  Filled 2016-12-29: qty 1

## 2016-12-29 MED ORDER — SODIUM CHLORIDE 0.9 % IV BOLUS (SEPSIS)
250.0000 mL | Freq: Once | INTRAVENOUS | Status: AC
  Administered 2016-12-29: 250 mL via INTRAVENOUS

## 2016-12-29 NOTE — Consult Note (Signed)
[facility] for Infectious Disease    Date of Admission:  12/23/2016   Total days of antibiotics 7              Reason for Consult: Possible osteomyelitis of distal right tibia    Referring Physician: Dr. Domenic Polite  Principal Problem:   Osteomyelitis of ankle [facility]) Active Problems:   Diabetic ulcer of ankle ([place])   PAD (peripheral artery disease) ([place])   Sepsis ([place])   DM2 (diabetes mellitus, type 2) ([place])   Hypotension   AKI (acute kidney injury) ([place])   Malnutrition ([place])   Thrush   Chronic pain   Buttock wound, left, subsequent encounter   . aspirin EC  81 mg Oral Daily  . cefTAZidime (FORTAZ)  IV  1 g Intravenous Q8H  . docusate sodium  200 mg Oral QHS  . enoxaparin (LOVENOX) injection  40 mg Subcutaneous Daily  . feeding supplement (GLUCERNA SHAKE)  237 mL Oral TID BM  . fenofibrate  54 mg Oral Daily  . insulin aspart  0-15 Units Subcutaneous TID WC  . insulin aspart  0-5 Units Subcutaneous QHS  . insulin glargine  40 Units Subcutaneous BID  . LORazepam  1 mg Intravenous Once  . mouth rinse  15 mL Mouth Rinse BID  . midodrine  10 mg Oral TID WC  . multivitamin with minerals  1 tablet Oral Daily  . pantoprazole  80 mg Oral Daily  . pregabalin  100 mg Oral TID  . sodium chloride flush  3 mL Intravenous Q12H  . vancomycin  750 mg Intravenous Q12H    Recommendations: 1. Discontinue vancomycin and cefepime  2. Recommend orthopedic consultation  Assessment: It is quite possible that the edema and enhancement on the MRI represents chronic osteomyelitis. She's had several recent short courses of antibiotics (for her necrotizing fasciitis last fall, recently treated for sinusitis and during this admission) that may have provided partial treatment. Optimal management would include a bone biopsy obtained off of antibiotic therapy and optimization of arterial flow to her foot. I will stop antibiotic therapy at this time. I would recommend orthopedic  consultation.   HPI: [name] is a 51 y.o. female with diabetes and peripheral artery disease who has a chronic ulcer over her right lateral malleolus. It has been present for over 4 years. She has been followed at the wound center in [place], [place] by Dr. Colette Ribas. He has also been treating her for a poor healing sacral wound. She was hospitalized with necrotizing fasciitis of that wound last November and treated with debridement and about a week of antibiotic therapy. She was hospitalized one week ago after she was found to be hypotensive at her pain clinic. She was not feeling ill at that time. Because of concerns for possible sepsis she was started on broad empiric antibiotic therapy. Blood cultures have been negative. Because of the chronic ulcer on her ankle she underwent an MRI which showed enhancement and edema of the distal right tibia compatible with osteomyelitis.   Review of Systems: Review of Systems  Constitutional: Negative for chills, diaphoresis, fever, malaise/fatigue and weight loss.  HENT: Negative for sore throat.   Respiratory: Negative for cough, sputum production and shortness of breath.   Cardiovascular: Negative for chest pain.  Gastrointestinal: Negative for abdominal pain, diarrhea, heartburn, nausea and vomiting.  Genitourinary: Negative for dysuria and frequency.  Musculoskeletal: Negative for joint pain and myalgias.  Skin:  Negative for rash.  Neurological: Positive for sensory change. Negative for dizziness and headaches.    Past Medical History:  Diagnosis Date  . Arthritis   . Asthma   . Chronic back pain   . Depression   . Diabetes mellitus   . Diabetic neuropathy ([place])   . Family history of adverse reaction to anesthesia    mother had difficlty waking   . GERD (gastroesophageal reflux disease)   . Hyperlipidemia   . Joint pain   . Leg pain    With Walking  . PAD (peripheral artery disease) ([place])   . Reflux   . Ulcer ([place])    Foot     Social History  Substance Use Topics  . Smoking status: Current Every Day Smoker    Packs/day: 1.50    Years: 30.00    Types: Cigarettes  . Smokeless tobacco: Never Used     Comment: 2 cigarettes a day  . Alcohol use No    Family History  Problem Relation Age of Onset  . Coronary artery disease Mother   . Peripheral vascular disease Mother   . Heart disease Mother     Before age 12  . Other Mother     Venous insuffiency  . Diabetes Mother   . Hyperlipidemia Mother   . Hypertension Mother   . Varicose Veins Mother   . Heart attack Mother     before age 35  . Heart disease Father   . Diabetes Father   . Diabetes Maternal Grandmother   . Diabetes Paternal Grandmother   . Diabetes Paternal Grandfather   . Diabetes Sister   . Hypertension Sister   . Diabetes Brother   . Hypertension Brother    Allergies  Allergen Reactions  . Penicillins Other (See Comments)    Severe Headache (not so sure about this now) Has patient had a PCN reaction causing immediate rash, facial/tongue/throat swelling, SOB or lightheadedness with hypotension: No Has patient had a PCN reaction causing severe rash involving mucus membranes or skin necrosis: No Has patient had a PCN reaction that required hospitalization: No Has patient had a PCN reaction occurring within the last 10 years: No If all of the above answers are "NO", then may proceed with Cephalosporin use.     OBJECTIVE: Blood pressure (!) 90/54, pulse 86, temperature 98.2 F (36.8 C), resp. rate 18, height 5\' 6"  (1.676 m), weight 144 lb 10 oz (65.6 kg), SpO2 93 %.  Physical Exam  Constitutional: She is oriented to person, place, and time.  She is in no distress sitting up in a chair.  HENT:  Mouth/Throat: No oropharyngeal exudate.  Multiple missing teeth.  Cardiovascular: Normal rate and regular rhythm.   No murmur heard. Pulmonary/Chest: Effort normal and breath sounds normal.  Abdominal: Soft. There is no tenderness.    Musculoskeletal:  She has a VAC dressing on her right buttock wound. There is a nickel size chronic ulcer over her right lateral malleolus. There is yellow exudate covering 100% of the ulcer. There is no surrounding cellulitis, drainage or odor.  Neurological: She is alert and oriented to person, place, and time.  Skin: No rash noted.  Psychiatric: Mood and affect normal.    Lab Results Lab Results  Component Value Date   WBC 16.3 (H) 12/26/2016   HGB 10.4 (L) 12/26/2016   HCT 33.5 (L) 12/26/2016   MCV 82.5 12/26/2016   PLT 455 (H) 12/26/2016    Lab Results  Component Value Date  CREATININE 0.57 12/28/2016   BUN 21 (H) 12/28/2016   NA 140 12/28/2016   K 4.4 12/28/2016   CL 103 12/28/2016   CO2 27 12/28/2016    Lab Results  Component Value Date   ALT 8 (L) 12/23/2016   AST 14 (L) 12/23/2016   ALKPHOS 85 12/23/2016   BILITOT 0.4 12/23/2016     Microbiology: Recent Results (from the past 240 hour(s))  Culture, blood (Routine x 2)     Status: None   Collection Time: 12/23/16  6:55 PM  Result Value Ref Range Status   Specimen Description BLOOD RIGHT FOREARM  Final   Special Requests IN PEDIATRIC BOTTLE 2CC  Final   Culture NO GROWTH 5 DAYS  Final   Report Status 12/28/2016 FINAL  Final  Culture, blood (Routine x 2)     Status: None   Collection Time: 12/23/16  8:20 PM  Result Value Ref Range Status   Specimen Description BLOOD RIGHT HAND  Final   Special Requests BOTTLES DRAWN AEROBIC AND ANAEROBIC 5CC  Final   Culture NO GROWTH 5 DAYS  Final   Report Status 12/28/2016 FINAL  Final  Urine culture     Status: Abnormal   Collection Time: 12/23/16 10:21 PM  Result Value Ref Range Status   Specimen Description URINE, RANDOM  Final   Special Requests NONE  Final   Culture MULTIPLE SPECIES PRESENT, SUGGEST RECOLLECTION (A)  Final   Report Status 12/25/2016 FINAL  Final  MRSA PCR Screening     Status: None   Collection Time: 12/24/16  4:19 AM  Result Value Ref Range  Status   MRSA by PCR NEGATIVE NEGATIVE Final    Comment:        The GeneXpert MRSA Assay (FDA approved for NASAL specimens only), is one component of a comprehensive MRSA colonization surveillance program. It is not intended to diagnose MRSA infection nor to guide or monitor treatment for MRSA infections.     Michel Bickers, MD [facility] [phone] pager   [phone] cell 12/29/2016, 1:05 PM

## 2016-12-29 NOTE — Progress Notes (Signed)
PROGRESS NOTE    [name]  XNA:[id number] DOB: 18-Jan-1966 DOA: 12/23/2016 PCP: [name], MD  Brief Narrative: Patient is a 51 year old female with a history of hypertension, chronic pain, tobacco abuse type 2 diabetes, right lower extremity lateral malleolus eschars who was admitted 1113/17 with necrotizing fasciitis. She underwent debridement of the right buttocks wound. It was 16 x 11 x 3 cm deep. During that hospitalization she had an NSTEMI, and right-sided weakness with stroke like symptoms. She was ultimately discharged and was being followed by the wound care clinic and Dr. Nils Pyle. She returned on 11/27/16 with hypotension and dizziness. Was re admitted with hypotension  BP in the 60's; with possible sepsis on 11/27/16.  She was reevaluated on 110/18 by Dr. Donnie Mesa. He recommended ongoing hydrotherapy and local wound care. Bp soft at baseline Severe PAD with R leg wound/chronic followed by Dr.Brabham Transferred [facility] 2/7 VVS consulting planning arteriogram  Assessment & Plan: 1. Hypotension with possible sepsis -improved with fluid resuscitation, pressors and empiric Abx in ICU -Blood Cx negative, urine cx polymicrobial -per CCS -sacral wound unlikely to be source of sepsis -has R ankle ischemic ulcer- MRI R ankle concerning for cellulitis about the lateral ankle with a soft tissue wound overlying the lateral malleolus and possible osteo of distal 4.5-5 cm of the fibula -resumed home midodrine -cut down and stopped Hydrocortisone, no evidence of AI, random cortisol 11.3 -on IV Vanc and Fortaz , ID consulted due to concern for possible osteomyelitis -improved, stop IVF  2. 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 -clinically I suspect Chronic osteo, will ask for ID input, d/w Dr.Campbell -severe PAD with ABI of 0.45, followed by  Dr.Brabham, appreciate VVS input, plan for angiogram Tuesday -On Empiric Vanc/Fortaz  3. Hx of necrotizing fasciitis of the right buttocks; status post debridement 09/30/16,Dr. Georganna Skeans -appreciate CCS input, wound appears to be healing well -continue wound care, followed by [facility] Wound center in [place]  4. Chronic back and neck pain - - chronic pain management , vicodin  5. Type 2 diabetes -with hypoglycemia 2/9 am -DM uncontrolled, cut down lantus, SSI  6. COPD/ tobacco use started age 74 -stable, nebs PRN  7. PAD/status post right fem-tib below-knee popliteal bypass grafting -with ischemic ulcer R leg, VVS consulted as above  DVT prophylaxis:Lovenox Code Status:Full Code Family Communication:None at bedside Disposition Plan:Pending vascular/Osteo workup   Consultants:   Vascular   Procedures:  Antimicrobials: Vanc/Fortaz  Subjective: Some abd cramps  Objective: Vitals:   12/28/16 0500 12/28/16 1514 12/28/16 2055 12/29/16 0520  BP: (!) 82/52 (!) 120/59 103/64 (!) 90/54  Pulse:  79 81 86  Resp:  18 19 18   Temp:  98.2 F (36.8 C) 98.4 F (36.9 C) 98.2 F (36.8 C)  TempSrc:  Oral    SpO2:  100% 100% 93%  Weight:      Height:        Intake/Output Summary (Last 24 hours) at 12/29/16 1206 Last data filed at 12/29/16 1000  Gross per 24 hour  Intake          1831.67 ml  Output              500 ml  Net          1331.67 ml   Filed Weights   12/24/16 0300  Weight: 65.6 kg (  144 lb 10 oz)    Examination:  General exam: Appears calm and comfortable, AAOx3, no distress Respiratory system: Clear to auscultation. Respiratory effort normal. Cardiovascular system: S1 & S2 heard, RRR. No JVD, murmurs, rubs, gallops or clicks.  Gastrointestinal system: Abdomen is nondistended, soft and nontender. Normal bowel sounds heard, large sacral decub with pink gram tissue and small eschar/purulence at one edge Central nervous system: Alert and oriented. No focal  neurological deficits. Extremities: R leg wound with deep ulcer with purulence near ankle, mild surrounding erythema and edema  Skin: No rashes, lesions or ulcers Psychiatry: Judgement and insight appear normal. Mood & affect appropriate.     Data Reviewed: I have personally reviewed following labs and imaging studies  CBC:  Recent Labs Lab 12/23/16 1855 12/24/16 0408 12/26/16 0418  WBC 16.9* 13.2* 16.3*  NEUTROABS 12.4*  --   --   HGB 13.2 10.4* 10.4*  HCT 39.9 32.7* 33.5*  MCV 80.8 81.1 82.5  PLT 440* 399 425*   Basic Metabolic Panel:  Recent Labs Lab 12/23/16 1855 12/24/16 0408 12/25/16 0802 12/26/16 0418 12/28/16 1011  NA 130* 138 139 140 140  K 3.7 3.2* 4.5 3.0* 4.4  CL 84* 99* 96* 98* 103  CO2 30 28 32 30 27  GLUCOSE 357* 344* 374* 82 120*  BUN 25* 15 12 17  21*  CREATININE 1.10* 0.75 0.74 0.81 0.57  CALCIUM 9.9 8.6* 9.1 9.0 9.4   GFR: Estimated Creatinine Clearance: 77 mL/min (by C-G formula based on SCr of 0.57 mg/dL). Liver Function Tests:  Recent Labs Lab 12/23/16 1855  AST 14*  ALT 8*  ALKPHOS 85  BILITOT 0.4  PROT 7.5  ALBUMIN 3.0*   No results for input(s): LIPASE, AMYLASE in the last 168 hours. No results for input(s): AMMONIA in the last 168 hours. Coagulation Profile:  Recent Labs Lab 12/23/16 1855  INR 1.06   Cardiac Enzymes: No results for input(s): CKTOTAL, CKMB, CKMBINDEX, TROPONINI in the last 168 hours. BNP (last 3 results) No results for input(s): PROBNP in the last 8760 hours. HbA1C: No results for input(s): HGBA1C in the last 72 hours. CBG:  Recent Labs Lab 12/28/16 1733 12/28/16 2036 12/28/16 2309 12/29/16 0257 12/29/16 0751  GLUCAP 126* 90 148* 108* 108*   Lipid Profile: No results for input(s): CHOL, HDL, LDLCALC, TRIG, CHOLHDL, LDLDIRECT in the last 72 hours. Thyroid Function Tests: No results for input(s): TSH, T4TOTAL, FREET4, T3FREE, THYROIDAB in the last 72 hours. Anemia Panel: No results for  input(s): VITAMINB12, FOLATE, FERRITIN, TIBC, IRON, RETICCTPCT in the last 72 hours. Urine analysis:    Component Value Date/Time   COLORURINE STRAW (A) 12/23/2016 2221   APPEARANCEUR CLEAR 12/23/2016 2221   LABSPEC 1.002 (L) 12/23/2016 2221   PHURINE 6.0 12/23/2016 2221   GLUCOSEU >=500 (A) 12/23/2016 2221   HGBUR NEGATIVE 12/23/2016 2221   BILIRUBINUR NEGATIVE 12/23/2016 2221   KETONESUR NEGATIVE 12/23/2016 2221   PROTEINUR NEGATIVE 12/23/2016 2221   UROBILINOGEN 0.2 06/25/2011 1707   NITRITE NEGATIVE 12/23/2016 2221   LEUKOCYTESUR NEGATIVE 12/23/2016 2221   Sepsis Labs: @LABRCNTIP (procalcitonin:4,lacticidven:4)  ) Recent Results (from the past 240 hour(s))  Culture, blood (Routine x 2)     Status: None   Collection Time: 12/23/16  6:55 PM  Result Value Ref Range Status   Specimen Description BLOOD RIGHT FOREARM  Final   Special Requests IN PEDIATRIC BOTTLE [facility]  Final   Culture NO GROWTH 5 DAYS  Final   Report Status 12/28/2016 FINAL  Final  Culture, blood (Routine x 2)     Status: None   Collection Time: 12/23/16  8:20 PM  Result Value Ref Range Status   Specimen Description BLOOD RIGHT HAND  Final   Special Requests BOTTLES DRAWN AEROBIC AND ANAEROBIC 5CC  Final   Culture NO GROWTH 5 DAYS  Final   Report Status 12/28/2016 FINAL  Final  Urine culture     Status: Abnormal   Collection Time: 12/23/16 10:21 PM  Result Value Ref Range Status   Specimen Description URINE, RANDOM  Final   Special Requests NONE  Final   Culture MULTIPLE SPECIES PRESENT, SUGGEST RECOLLECTION (A)  Final   Report Status 12/25/2016 FINAL  Final  MRSA PCR Screening     Status: None   Collection Time: 12/24/16  4:19 AM  Result Value Ref Range Status   MRSA by PCR NEGATIVE NEGATIVE Final    Comment:        The GeneXpert MRSA Assay (FDA approved for NASAL specimens only), is one component of a comprehensive MRSA colonization surveillance program. It is not intended to diagnose MRSA infection  nor to guide or monitor treatment for MRSA infections.          Radiology Studies: No results found.      Scheduled Meds: . aspirin EC  81 mg Oral Daily  . cefTAZidime (FORTAZ)  IV  1 g Intravenous Q8H  . docusate sodium  200 mg Oral QHS  . enoxaparin (LOVENOX) injection  40 mg Subcutaneous Daily  . feeding supplement (GLUCERNA SHAKE)  237 mL Oral TID BM  . fenofibrate  54 mg Oral Daily  . insulin aspart  0-15 Units Subcutaneous TID WC  . insulin aspart  0-5 Units Subcutaneous QHS  . insulin glargine  40 Units Subcutaneous BID  . LORazepam  1 mg Intravenous Once  . mouth rinse  15 mL Mouth Rinse BID  . midodrine  10 mg Oral TID WC  . multivitamin with minerals  1 tablet Oral Daily  . pantoprazole  80 mg Oral Daily  . pregabalin  100 mg Oral TID  . sodium chloride flush  3 mL Intravenous Q12H  . vancomycin  750 mg Intravenous Q12H   Continuous Infusions:    LOS: 6 days    Time spent: 81min    Domenic Polite, MD [name] Pager [phone]  If 7PM-7AM, please contact night-coverage [email or web address] Password [facility] 12/29/2016, 12:06 PM

## 2016-12-30 DIAGNOSIS — M86661 Other chronic osteomyelitis, right tibia and fibula: Secondary | ICD-10-CM

## 2016-12-30 DIAGNOSIS — Z8739 Personal history of other diseases of the musculoskeletal system and connective tissue: Secondary | ICD-10-CM

## 2016-12-30 DIAGNOSIS — E1169 Type 2 diabetes mellitus with other specified complication: Secondary | ICD-10-CM

## 2016-12-30 DIAGNOSIS — R7989 Other specified abnormal findings of blood chemistry: Secondary | ICD-10-CM

## 2016-12-30 DIAGNOSIS — L97509 Non-pressure chronic ulcer of other part of unspecified foot with unspecified severity: Secondary | ICD-10-CM

## 2016-12-30 DIAGNOSIS — E11621 Type 2 diabetes mellitus with foot ulcer: Secondary | ICD-10-CM

## 2016-12-30 LAB — BASIC METABOLIC PANEL
Anion gap: 8 (ref 5–15)
BUN: 24 mg/dL — AB (ref 6–20)
CO2: 29 mmol/L (ref 22–32)
CREATININE: 0.59 mg/dL (ref 0.44–1.00)
Calcium: 9.6 mg/dL (ref 8.9–10.3)
Chloride: 100 mmol/L — ABNORMAL LOW (ref 101–111)
GFR calc non Af Amer: >60 mL/min (ref >60)
Glucose, Bld: 163 mg/dL — ABNORMAL HIGH (ref 65–99)
Potassium: 4.7 mmol/L (ref 3.5–5.1)
Sodium: 137 mmol/L (ref 135–145)

## 2016-12-30 LAB — CBC
HCT: 36.3 % (ref 36.0–46.0)
Hemoglobin: 11.1 g/dL — ABNORMAL LOW (ref 12.0–15.0)
MCH: 25.6 pg — AB (ref 26.0–34.0)
MCHC: 30.6 g/dL (ref 30.0–36.0)
MCV: 83.8 fL (ref 78.0–100.0)
PLATELETS: 443 10*3/uL — AB (ref 150–400)
RBC: 4.33 MIL/uL (ref 3.87–5.11)
RDW: 15.5 % (ref 11.5–15.5)
WBC: 11.7 10*3/uL — AB (ref 4.0–10.5)

## 2016-12-30 LAB — GLUCOSE, CAPILLARY
GLUCOSE-CAPILLARY: 109 mg/dL — AB (ref 65–99)
GLUCOSE-CAPILLARY: 113 mg/dL — AB (ref 65–99)
Glucose-Capillary: 173 mg/dL — ABNORMAL HIGH (ref 65–99)
Glucose-Capillary: 126 mg/dL — ABNORMAL HIGH (ref 65–99)

## 2016-12-30 NOTE — Progress Notes (Signed)
PROGRESS NOTE    [name]  CXK:[id number] DOB: 1966-10-15 DOA: 12/23/2016 PCP: [name], MD  Brief Narrative: Patient is a 51 year old female with a history of hypertension, chronic pain, tobacco abuse type 2 diabetes, right lower extremity lateral malleolus chronic wound who was admitted 1113/17 with sacral necrotizing fasciitis. She underwent debridement of the right buttocks wound. It was 16 x 11 x 3 cm deep. During that hospitalization she had an NSTEMI, and right-sided weakness with stroke like symptoms. She was ultimately discharged and was being followed by the wound care clinic and Dr. Nils Pyle.  She returns now to the ED, from the wound care clinic with hypotension, BP in the 80s. Elevated lactate 4.06. She was admitted for possible sepsis to the intensive care unit. -improved with fluids and empiric Abx, seen by CCS-sacral wound felt to be ok. -MRI ankle with possible Osteo, Severe PAD with R leg wound/chronic followed by Dr.Brabham Transferred from PCC[facility] to [facility] 2/8 VVS consulting planning arteriogram, ID consulted, and Ortho consulted for bone biopsy  Assessment & Plan: 1. Hypotension with possible sepsis -improved with fluid resuscitation, pressors and empiric Abx in ICU -Blood Cx negative, urine cx polymicrobial -per CCS -sacral wound healing unlikely to be source of sepsis -has R ankle ischemic ulcer- MRI R ankle concerning for cellulitis around the lateral ankle and possible osteo of distal 4.5-5 cm of the fibula -resumed home midodrine -cut down and stopped Hydrocortisone, no evidence of AI, random cortisol 11.3 -improved, stop IVF -see discussion below  2. 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 -clinically I suspect Chronic osteo, ID Consulting appreciate Dr.Campbell's input -severe PAD with ABI of 0.45, followed by Dr.Brabham, appreciate  VVS input, plan for angiogram Tuesday -Abx stopped after 6days yesterday, Ortho consulted per Dr.Campbell's recommendation for [facility] biopsy d/w Dr.Murphy  3. Hx of necrotizing fasciitis of the right buttocks; status post debridement 09/30/16,Dr. Georganna Skeans -appreciate CCS input, wound appears to be healing well -continue wound care, followed by [facility] at Wound center in [place]  4. Chronic back and neck pain - - chronic pain management , vicodin  5. Type 2 diabetes -with hypoglycemia 2/9 am -DM uncontrolled, cut down lantus, SSI, now stable  6. COPD/ tobacco use started age 42 -stable, nebs PRN  7. PAD/status post right fem-tib below-knee popliteal bypass grafting -with ischemic ulcer R leg, VVS consulted as above  DVT prophylaxis:Lovenox Code Status:Full Code Family Communication:None at bedside Disposition Plan:Pending vascular/Osteo workup   Consultants:   Vascular   Procedures:  Antimicrobials: Vanc/Fortaz 2/5-2/11  Subjective: Feels ok, anxious to go home  Objective: Vitals:   12/29/16 1620 12/29/16 1815 12/29/16 2115 12/30/16 0601  BP: (!) 100/58 (!) 98/58 104/61 (!) 86/47  Pulse:   83 76  Resp:   18 18  Temp:   98.2 F (36.8 C) 98.1 F (36.7 C)  TempSrc:   Oral Oral  SpO2:   100% 100%  Weight:      Height:        Intake/Output Summary (Last 24 hours) at 12/30/16 1029 Last data filed at 12/30/16 0920  Gross per 24 hour  Intake              800 ml  Output              500 ml  Net  300 ml   Filed Weights   12/24/16 0300  Weight: 65.6 kg (144 lb 10 oz)    Examination:  General exam: Appears calm and comfortable, AAOx3, no distress Respiratory system: Clear to auscultation. Respiratory effort normal. Cardiovascular system: S1 & S2 heard, RRR. No JVD, murmurs, rubs, gallops or clicks.  Gastrointestinal system: Abdomen is nondistended, soft and nontender. Normal bowel sounds heard, large sacral decub with pink gram tissue and small  eschar/purulence at one edge Central nervous system: Alert and oriented. No focal neurological deficits. Extremities: R leg wound with dime sized ulcer with purulence near ankle, mild surrounding erythema and edema  Skin: No rashes, lesions or ulcers Psychiatry: Judgement and insight appear normal. Mood & affect appropriate.     Data Reviewed: I have personally reviewed following labs and imaging studies  CBC:  Recent Labs Lab 12/23/16 1855 12/24/16 0408 12/26/16 0418  WBC 16.9* 13.2* 16.3*  NEUTROABS 12.4*  --   --   HGB 13.2 10.4* 10.4*  HCT 39.9 32.7* 33.5*  MCV 80.8 81.1 82.5  PLT 440* 399 935*   Basic Metabolic Panel:  Recent Labs Lab 12/23/16 1855 12/24/16 0408 12/25/16 0802 12/26/16 0418 12/28/16 1011  NA 130* 138 139 140 140  K 3.7 3.2* 4.5 3.0* 4.4  CL 84* 99* 96* 98* 103  CO2 30 28 32 30 27  GLUCOSE 357* 344* 374* 82 120*  BUN 25* 15 12 17  21*  CREATININE 1.10* 0.75 0.74 0.81 0.57  CALCIUM 9.9 8.6* 9.1 9.0 9.4   GFR: Estimated Creatinine Clearance: 77 mL/min (by C-G formula based on SCr of 0.57 mg/dL). Liver Function Tests:  Recent Labs Lab 12/23/16 1855  AST 14*  ALT 8*  ALKPHOS 85  BILITOT 0.4  PROT 7.5  ALBUMIN 3.0*   No results for input(s): LIPASE, AMYLASE in the last 168 hours. No results for input(s): AMMONIA in the last 168 hours. Coagulation Profile:  Recent Labs Lab 12/23/16 1855  INR 1.06   Cardiac Enzymes: No results for input(s): CKTOTAL, CKMB, CKMBINDEX, TROPONINI in the last 168 hours. BNP (last 3 results) No results for input(s): PROBNP in the last 8760 hours. HbA1C: No results for input(s): HGBA1C in the last 72 hours. CBG:  Recent Labs Lab 12/29/16 0751 12/29/16 1210 12/29/16 1611 12/29/16 2115 12/30/16 0753  GLUCAP 108* 170* 163* 225* 109*   Lipid Profile: No results for input(s): CHOL, HDL, LDLCALC, TRIG, CHOLHDL, LDLDIRECT in the last 72 hours. Thyroid Function Tests: No results for input(s): TSH,  T4TOTAL, FREET4, T3FREE, THYROIDAB in the last 72 hours. Anemia Panel: No results for input(s): VITAMINB12, FOLATE, FERRITIN, TIBC, IRON, RETICCTPCT in the last 72 hours. Urine analysis:    Component Value Date/Time   COLORURINE STRAW (A) 12/23/2016 2221   APPEARANCEUR CLEAR 12/23/2016 2221   LABSPEC 1.002 (L) 12/23/2016 2221   PHURINE 6.0 12/23/2016 2221   GLUCOSEU >=500 (A) 12/23/2016 2221   HGBUR NEGATIVE 12/23/2016 2221   BILIRUBINUR NEGATIVE 12/23/2016 2221   KETONESUR NEGATIVE 12/23/2016 2221   PROTEINUR NEGATIVE 12/23/2016 2221   UROBILINOGEN 0.2 06/25/2011 1707   NITRITE NEGATIVE 12/23/2016 2221   LEUKOCYTESUR NEGATIVE 12/23/2016 2221   Sepsis Labs: @LABRCNTIP (procalcitonin:4,lacticidven:4)  ) Recent Results (from the past 240 hour(s))  Culture, blood (Routine x 2)     Status: None   Collection Time: 12/23/16  6:55 PM  Result Value Ref Range Status   Specimen Description BLOOD RIGHT FOREARM  Final   Special Requests IN PEDIATRIC BOTTLE [facility]  Final  Culture NO GROWTH 5 DAYS  Final   Report Status 12/28/2016 FINAL  Final  Culture, blood (Routine x 2)     Status: None   Collection Time: 12/23/16  8:20 PM  Result Value Ref Range Status   Specimen Description BLOOD RIGHT HAND  Final   Special Requests BOTTLES DRAWN AEROBIC AND ANAEROBIC 5CC  Final   Culture NO GROWTH 5 DAYS  Final   Report Status 12/28/2016 FINAL  Final  Urine culture     Status: Abnormal   Collection Time: 12/23/16 10:21 PM  Result Value Ref Range Status   Specimen Description URINE, RANDOM  Final   Special Requests NONE  Final   Culture MULTIPLE SPECIES PRESENT, SUGGEST RECOLLECTION (A)  Final   Report Status 12/25/2016 FINAL  Final  MRSA PCR Screening     Status: None   Collection Time: 12/24/16  4:19 AM  Result Value Ref Range Status   MRSA by PCR NEGATIVE NEGATIVE Final    Comment:        The GeneXpert MRSA Assay (FDA approved for NASAL specimens only), is one component of a comprehensive  MRSA colonization surveillance program. It is not intended to diagnose MRSA infection nor to guide or monitor treatment for MRSA infections.          Radiology Studies: No results found.      Scheduled Meds: . aspirin EC  81 mg Oral Daily  . docusate sodium  200 mg Oral QHS  . enoxaparin (LOVENOX) injection  40 mg Subcutaneous Daily  . feeding supplement (GLUCERNA SHAKE)  237 mL Oral TID BM  . fenofibrate  54 mg Oral Daily  . insulin aspart  0-15 Units Subcutaneous TID WC  . insulin aspart  0-5 Units Subcutaneous QHS  . insulin glargine  40 Units Subcutaneous BID  . LORazepam  1 mg Intravenous Once  . mouth rinse  15 mL Mouth Rinse BID  . midodrine  10 mg Oral TID WC  . multivitamin with minerals  1 tablet Oral Daily  . pantoprazole  80 mg Oral Daily  . pregabalin  100 mg Oral TID  . sodium chloride flush  3 mL Intravenous Q12H   Continuous Infusions:    LOS: 7 days    Time spent: 29min    Domenic Polite, MD [name] Pager [phone]  If 7PM-7AM, please contact night-coverage [email or web address] Password [facility] 12/30/2016, 10:29 AM

## 2016-12-30 NOTE — Progress Notes (Addendum)
Subjective:  She was in tears when the idea of a bone biopsy was brought up.   Antibiotics:  Anti-infectives    Start     Dose/Rate Route Frequency Ordered Stop   12/27/16 2300  vancomycin (VANCOCIN) IVPB 750 mg/150 ml premix  Status:  Discontinued     750 mg 150 mL/hr over 60 Minutes Intravenous Every 12 hours 12/27/16 2158 12/29/16 1316   12/24/16 1300  cefTAZidime (FORTAZ) 1 g in dextrose 5 % 50 mL IVPB  Status:  Discontinued     1 g 100 mL/hr over 30 Minutes Intravenous Every 8 hours 12/24/16 1122 12/29/16 1316   12/24/16 1130  fluconazole (DIFLUCAN) tablet 100 mg     100 mg Oral Daily 12/24/16 1129 12/26/16 0932   12/24/16 1000  vancomycin (VANCOCIN) 500 mg in sodium chloride 0.9 % 100 mL IVPB  Status:  Discontinued     500 mg 100 mL/hr over 60 Minutes Intravenous Every 12 hours 12/24/16 0306 12/27/16 2158   12/24/16 0830  vancomycin (VANCOCIN) 500 mg in sodium chloride 0.9 % 100 mL IVPB  Status:  Discontinued     500 mg 100 mL/hr over 60 Minutes Intravenous Every 12 hours 12/23/16 1959 12/24/16 0250   12/24/16 0600  ceFEPIme (MAXIPIME) 1 g in dextrose 5 % 50 mL IVPB  Status:  Discontinued     1 g 100 mL/hr over 30 Minutes Intravenous Every 8 hours 12/24/16 0305 12/24/16 1122   12/24/16 0430  ceFEPIme (MAXIPIME) 1 g in dextrose 5 % 50 mL IVPB  Status:  Discontinued     1 g 100 mL/hr over 30 Minutes Intravenous Every 8 hours 12/23/16 1959 12/24/16 0250   12/24/16 0400  metroNIDAZOLE (FLAGYL) tablet 500 mg  Status:  Discontinued     500 mg Oral Every 8 hours 12/24/16 0250 12/24/16 1122   12/24/16 0015  fluconazole (DIFLUCAN) IVPB 100 mg  Status:  Discontinued     100 mg 50 mL/hr over 60 Minutes Intravenous Every 24 hours 12/24/16 0007 12/24/16 1129   12/23/16 1945  ceFEPIme (MAXIPIME) 2 g in dextrose 5 % 50 mL IVPB     2 g 100 mL/hr over 30 Minutes Intravenous  Once 12/23/16 1941 12/24/16 0130   12/23/16 1945  vancomycin (VANCOCIN) IVPB 1000 mg/200 mL premix     1,000 mg 200 mL/hr over 60 Minutes Intravenous  Once 12/23/16 1941 12/23/16 2225      Medications: Scheduled Meds: . aspirin EC  81 mg Oral Daily  . docusate sodium  200 mg Oral QHS  . enoxaparin (LOVENOX) injection  40 mg Subcutaneous Daily  . feeding supplement (GLUCERNA SHAKE)  237 mL Oral TID BM  . fenofibrate  54 mg Oral Daily  . insulin aspart  0-15 Units Subcutaneous TID WC  . insulin aspart  0-5 Units Subcutaneous QHS  . insulin glargine  40 Units Subcutaneous BID  . LORazepam  1 mg Intravenous Once  . mouth rinse  15 mL Mouth Rinse BID  . midodrine  10 mg Oral TID WC  . multivitamin with minerals  1 tablet Oral Daily  . pantoprazole  80 mg Oral Daily  . pregabalin  100 mg Oral TID  . sodium chloride flush  3 mL Intravenous Q12H   Continuous Infusions: PRN Meds:.acetaminophen **OR** acetaminophen, cyclobenzaprine, dicyclomine, HYDROcodone-acetaminophen, morphine injection, ondansetron **OR** ondansetron (ZOFRAN) IV    Objective: Weight change:   Intake/Output Summary (Last 24 hours) at 12/30/16 1243 Last  data filed at 12/30/16 1141  Gross per 24 hour  Intake              980 ml  Output              300 ml  Net              680 ml   Blood pressure (!) 86/47, pulse 76, temperature 98.1 F (36.7 C), temperature source Oral, resp. rate 18, height 5\' 6"  (1.676 m), weight 144 lb 10 oz (65.6 kg), SpO2 100 %. Temp:  [98.1 F (36.7 C)-98.2 F (36.8 C)] 98.1 F (36.7 C) (02/12 0601) Pulse Rate:  [76-83] 76 (02/12 0601) Resp:  [18] 18 (02/12 0601) BP: (72-104)/(43-61) 86/47 (02/12 0601) SpO2:  [94 %-100 %] 100 % (02/12 0601)  Physical Exam: General: Alert and awake, oriented x3, not in any acute distress. HEENT: anicteric sclera, pupils reactive to light and accommodation, EOMI CVS regular rate, normal r,  no murmur rubs or gallops Chest: clear to auscultation bilaterally, no wheezing, rales or rhonchi Abdomen: soft nontender, nondistended, normal bowel  sounds, Extremities: no  clubbing or edema noted bilaterally Skin: no rashes Lymph: no new lymphadenopathy Neuro: nonfocal  CBC:  ' CBC Latest Ref Rng & Units 12/26/2016 12/24/2016 12/23/2016  WBC 4.0 - 10.5 K/uL 16.3(H) 13.2(H) 16.9(H)  Hemoglobin 12.0 - 15.0 g/dL 10.4(L) 10.4(L) 13.2  Hematocrit 36.0 - 46.0 % 33.5(L) 32.7(L) 39.9  Platelets 150 - 400 K/uL 455(H) 399 440(H)     BMET  Recent Labs  12/28/16 1011  NA 140  K 4.4  CL 103  CO2 27  GLUCOSE 120*  BUN 21*  CREATININE 0.57  CALCIUM 9.4     Liver Panel  No results for input(s): PROT, ALBUMIN, AST, ALT, ALKPHOS, BILITOT, BILIDIR, IBILI in the last 72 hours.     Sedimentation Rate No results for input(s): ESRSEDRATE in the last 72 hours. C-Reactive Protein No results for input(s): CRP in the last 72 hours.  Micro Results: Recent Results (from the past 720 hour(s))  Culture, blood (Routine x 2)     Status: None   Collection Time: 12/23/16  6:55 PM  Result Value Ref Range Status   Specimen Description BLOOD RIGHT FOREARM  Final   Special Requests IN PEDIATRIC BOTTLE 2CC  Final   Culture NO GROWTH 5 DAYS  Final   Report Status 12/28/2016 FINAL  Final  Culture, blood (Routine x 2)     Status: None   Collection Time: 12/23/16  8:20 PM  Result Value Ref Range Status   Specimen Description BLOOD RIGHT HAND  Final   Special Requests BOTTLES DRAWN AEROBIC AND ANAEROBIC 5CC  Final   Culture NO GROWTH 5 DAYS  Final   Report Status 12/28/2016 FINAL  Final  Urine culture     Status: Abnormal   Collection Time: 12/23/16 10:21 PM  Result Value Ref Range Status   Specimen Description URINE, RANDOM  Final   Special Requests NONE  Final   Culture MULTIPLE SPECIES PRESENT, SUGGEST RECOLLECTION (A)  Final   Report Status 12/25/2016 FINAL  Final  MRSA PCR Screening     Status: None   Collection Time: 12/24/16  4:19 AM  Result Value Ref Range Status   MRSA by PCR NEGATIVE NEGATIVE Final    Comment:        The  GeneXpert MRSA Assay (FDA approved for NASAL specimens only), is one component of a comprehensive MRSA colonization surveillance program. It  is not intended to diagnose MRSA infection nor to guide or monitor treatment for MRSA infections.     Studies/Results: No results found.    Assessment/Plan:  INTERVAL HISTORY: And hepatic stopped, awaiting orthopedic surgery evaluation   Principal Problem:   Osteomyelitis of ankle (HCC) Active Problems:   PAD (peripheral artery disease) (HCC)   Sepsis ([place])   DM2 (diabetes mellitus, type 2) (HCC)   Hypotension   Diabetic ulcer of ankle ([place])   AKI (acute kidney injury) ([place])   Malnutrition ([place])   Thrush   Chronic pain   Buttock wound, left, subsequent encounter    [name] is a 51 y.o. female with  diabetic neuropathy diabetic foot ulcer peripheral vascular disease status post bypass surgery with evidence of osteomyelitis on MRI. She also has a sacral wound that is not healed up completely where she had prior necrotizing fasciitis.  #1 Osteomyelitis of distal fibula:  This is not an optimal site where prolonged antibiotics can affect cure.  Regardless I'm willing to try to give her targeted therapy based on a bone biopsy.  If this is indeed osteomyelitis the more optimal therapy to prolong her life and prevent recurrent sepsis and death from this would be a curative amputation either below the knee or above-the-knee depending on her vascular supply.  Currently she is emotionally overwhelmed by even the idea of having a biopsy done at all.  Await orthopedic surgery consult.  He is also going to have an angiogram performed by vascular surgery.  Engage Diabetic foot focused order set  # 2 sacral wound: Will examine tomorrow   I spent greater than 35 minutes with the patient including greater than 50% of time in face to face counsel of the patient having osteomyelitis and in coordination of her care.    LOS: 7  days   Alcide Evener 12/30/2016, 12:43 PM

## 2016-12-30 NOTE — Progress Notes (Signed)
Nutrition Follow-up  DOCUMENTATION CODES:   Not applicable  INTERVENTION:   -Continue Glucerna Shake po TID, each supplement provides 220 kcal and 10 grams of protein -Continue MVI daily  NUTRITION DIAGNOSIS:   Increased nutrient needs related to wound healing as evidenced by estimated needs.  Ongoing  GOAL:   Patient will meet greater than or equal to 90% of their needs  Progressing  MONITOR:   PO intake, Supplement acceptance, Skin, Labs  REASON FOR ASSESSMENT:   Consult Wound healing  ASSESSMENT:   51 year old female with PMH of chronic hypotension, sacral wound, peripheral arterial disease with a foot wound presented to the [facility] with a chief complaint of hypotension.  2/16- transferred from SDU to medical floor  RD received another consult for wound healing.   Reviewed [facility]OCN note from 01/03/17; pt with rt buttock wound with wound vac, chronic rt malleolar wound, and partial thickness rt pretibial injury.   Pt unavailable at time of visit. Appetite remains good; PO: 100%. Pt is accepting Glucerna shakes and MVI. RD will continue current interventions due to increased nutrient needs due to wound healing.   Per ID notes, plan for potential bone biopsy; pt may require a BKA or AKA. Awaiting orthopedics consult.   Labs reviewed: CBGS: 106-225.   Diet Order:  Diet Carb Modified Fluid consistency: Thin; Room service appropriate? Yes Diet NPO time specified Except for: Sips with Meds  Skin:  Wound (see comment) (wound vac to rt buttocks, DM ulcer to rt ankle)  Last BM:  12/29/16  Height:   Ht Readings from Last 1 Encounters:  12/24/16 5\' 6"  (1.676 m)    Weight:   Wt Readings from Last 1 Encounters:  12/24/16 144 lb 10 oz (65.6 kg)    Ideal Body Weight:  59.1 kg  BMI:  Body mass index is 23.34 kg/m.  Estimated Nutritional Needs:   Kcal:  1800-2000  Protein:  95-110 grams  Fluid:  >1.8 L  EDUCATION NEEDS:   No education needs identified at  this time  Priscila Bean A. Jimmye Norman, RD, LDN, CDE Pager: [phone] After hours Pager: [phone]

## 2016-12-31 ENCOUNTER — Encounter (HOSPITAL_COMMUNITY)
Admission: EM | Disposition: A | Discharge status: Home Health | Payer: Self-pay | Source: Home / Self Care | Attending: Internal Medicine

## 2016-12-31 ENCOUNTER — Encounter: Payer: Self-pay | Admitting: Family

## 2016-12-31 DIAGNOSIS — M86471 Chronic osteomyelitis with draining sinus, right ankle and foot: Secondary | ICD-10-CM

## 2016-12-31 LAB — BASIC METABOLIC PANEL
ANION GAP: 8 (ref 5–15)
ANION GAP: 10 (ref 5–15)
BUN: 23 mg/dL — AB (ref 6–20)
BUN: 23 mg/dL — ABNORMAL HIGH (ref 6–20)
CO2: 30 mmol/L (ref 22–32)
CO2: 27 mmol/L (ref 22–32)
Calcium: 9.4 mg/dL (ref 8.9–10.3)
Calcium: 9.8 mg/dL (ref 8.9–10.3)
Chloride: 101 mmol/L (ref 101–111)
Chloride: 98 mmol/L — ABNORMAL LOW (ref 101–111)
Creatinine, Ser: 0.64 mg/dL (ref 0.44–1.00)
Creatinine, Ser: 0.7 mg/dL (ref 0.44–1.00)
GFR calc Af Amer: >60 mL/min (ref >60)
GFR calc non Af Amer: >60 mL/min (ref >60)
GLUCOSE: 169 mg/dL — AB (ref 65–99)
GLUCOSE: 137 mg/dL — AB (ref 65–99)
POTASSIUM: 5 mmol/L (ref 3.5–5.1)
POTASSIUM: 4.6 mmol/L (ref 3.5–5.1)
Sodium: 136 mmol/L (ref 135–145)
Sodium: 138 mmol/L (ref 135–145)

## 2016-12-31 LAB — CBC
HEMATOCRIT: 35.8 % — AB (ref 36.0–46.0)
HEMOGLOBIN: 11 g/dL — AB (ref 12.0–15.0)
MCH: 26 pg (ref 26.0–34.0)
MCHC: 30.7 g/dL (ref 30.0–36.0)
MCV: 84.6 fL (ref 78.0–100.0)
Platelets: 406 10*3/uL — ABNORMAL HIGH (ref 150–400)
RBC: 4.23 MIL/uL (ref 3.87–5.11)
RDW: 16 % — ABNORMAL HIGH (ref 11.5–15.5)
WBC: 12.2 10*3/uL — ABNORMAL HIGH (ref 4.0–10.5)

## 2016-12-31 LAB — GLUCOSE, CAPILLARY
GLUCOSE-CAPILLARY: 91 mg/dL (ref 65–99)
GLUCOSE-CAPILLARY: 59 mg/dL — AB (ref 65–99)
GLUCOSE-CAPILLARY: 232 mg/dL — AB (ref 65–99)
Glucose-Capillary: 69 mg/dL (ref 65–99)
Glucose-Capillary: 109 mg/dL — ABNORMAL HIGH (ref 65–99)
Glucose-Capillary: 146 mg/dL — ABNORMAL HIGH (ref 65–99)

## 2016-12-31 LAB — C-REACTIVE PROTEIN: CRP: 1 mg/dL — AB (ref <1.0)

## 2016-12-31 LAB — SEDIMENTATION RATE: Sed Rate: 65 mm/hr — ABNORMAL HIGH (ref 0–22)

## 2016-12-31 LAB — PREALBUMIN: Prealbumin: 19.4 mg/dL (ref 18–38)

## 2016-12-31 LAB — PROTIME-INR
INR: 1.04
PROTHROMBIN TIME: 13.7 s (ref 11.4–15.2)

## 2016-12-31 SURGERY — ABDOMINAL AORTOGRAM W/LOWER EXTREMITY

## 2016-12-31 MED ORDER — DEXTROSE 50 % IV SOLN
50.0000 mL | Freq: Once | INTRAVENOUS | Status: AC
  Administered 2016-12-31: 50 mL via INTRAVENOUS
  Filled 2016-12-31: qty 50

## 2016-12-31 MED ORDER — SODIUM CHLORIDE 0.9 % IV SOLN
INTRAVENOUS | Status: DC
  Administered 2016-12-31 (×2): 1000 mL via INTRAVENOUS
  Administered 2017-01-01: 06:00:00 via INTRAVENOUS

## 2016-12-31 MED ORDER — DEXTROSE 50 % IV SOLN
INTRAVENOUS | Status: AC
  Filled 2016-12-31: qty 50

## 2016-12-31 MED ORDER — DEXTROSE 50 % IV SOLN
25.0000 mL | Freq: Once | INTRAVENOUS | Status: AC
  Administered 2016-12-31: 25 mL via INTRAVENOUS

## 2016-12-31 NOTE — Progress Notes (Signed)
Nursing staff tried unsuccessfully to change dressing on wound vac today. No wound nurse available at that time (one in surgery and one at  [facility]). Per wound nurse recommendation, RN changed with a wet to dry dressing until tomorrow in AM when the wound nurse will come to redress the wound. Will continue to monitor.

## 2016-12-31 NOTE — Progress Notes (Signed)
Staff was informed by Dr. Trula Slade that the procedure will be rescheduled. Patient made aware and the diet was restarted per Dr. Broadus John  Verbal order. Will continue to monitor.

## 2016-12-31 NOTE — Consult Note (Signed)
ORTHOPAEDIC CONSULTATION  REQUESTING PHYSICIAN: Domenic Polite, MD  Chief Complaint: Chronic ulcer lateral malleolus right ankle.  HPI: [name] is a 51 y.o. female who presents with patient states she's had an ulcer on her lateral malleolus for years.  Past Medical History:  Diagnosis Date  . Arthritis   . Asthma   . Chronic back pain   . Depression   . Diabetes mellitus   . Diabetic neuropathy ([place])   . Family history of adverse reaction to anesthesia    mother had difficlty waking   . GERD (gastroesophageal reflux disease)   . Hyperlipidemia   . Joint pain   . Leg pain    With Walking  . PAD (peripheral artery disease) ([place])   . Reflux   . Ulcer ([place])    Foot   Past Surgical History:  Procedure Laterality Date  . ABDOMINAL AORTAGRAM  June 15, 2014  . ABDOMINAL AORTAGRAM N/A 06/15/2014   Procedure: ABDOMINAL Maxcine Ham;  Surgeon: Serafina Mitchell, MD;  Location: [facility] CATH LAB;  Service: Cardiovascular;  Laterality: N/A;  . ABDOMINAL AORTAGRAM N/A 11/22/2014   Procedure: ABDOMINAL AORTAGRAM;  Surgeon: Serafina Mitchell, MD;  Location: [facility] CATH LAB;  Service: Cardiovascular;  Laterality: N/A;  . ARTERIAL BYPASS SURGRY  07/05/2010   Right Common Femoral to below knee popliteal BPG  . BACK SURGERY     X's  2  . CARDIAC CATHETERIZATION    . CHOLECYSTECTOMY     Gall Bladder  . CYSTECTOMY Right    foot  . CYSTECTOMY Left    wrist  . INTERCOSTAL NERVE BLOCK  November 2015  . IRRIGATION AND DEBRIDEMENT BUTTOCKS Right 09/30/2016   Procedure: DEBRIDEMENT RIGHT  BUTTOCK WOUND;  Surgeon: Georganna Skeans, MD;  Location: [place];  Service: General;  Laterality: Right;  . left foot surgery    . left wrist cyst removal Left   . PERIPHERAL VASCULAR CATHETERIZATION N/A 05/07/2016   Procedure: Abdominal Aortogram;  Surgeon: Serafina Mitchell, MD;  Location: [place] CV LAB;  Service: Cardiovascular;  Laterality: N/A;  . PERIPHERAL VASCULAR CATHETERIZATION N/A 05/07/2016   Procedure:  Lower Extremity Angiography;  Surgeon: Serafina Mitchell, MD;  Location: [place] CV LAB;  Service: Cardiovascular;  Laterality: N/A;  . PERIPHERAL VASCULAR CATHETERIZATION N/A 05/07/2016   Procedure: Aortic Arch Angiography;  Surgeon: Serafina Mitchell, MD;  Location: [place] CV LAB;  Service: Cardiovascular;  Laterality: N/A;  . PERIPHERAL VASCULAR CATHETERIZATION N/A 05/07/2016   Procedure: Upper Extremity Angiography;  Surgeon: Serafina Mitchell, MD;  Location: [place] CV LAB;  Service: Cardiovascular;  Laterality: N/A;  . PERIPHERAL VASCULAR CATHETERIZATION Right 05/07/2016   Procedure: Peripheral Vascular Balloon Angioplasty;  Surgeon: Serafina Mitchell, MD;  Location: [place] CV LAB;  Service: Cardiovascular;  Laterality: Right;  subclavian  . PERIPHERAL VASCULAR CATHETERIZATION Right 05/07/2016   Procedure: Peripheral Vascular Intervention;  Surgeon: Serafina Mitchell, MD;  Location: [place] CV LAB;  Service: Cardiovascular;  Laterality: Right;  External  Iliac  . SKIN GRAFT Right 2012   [name] by Dr. Nils Pyle- Right and Left Ankle  . SPINE SURGERY    . TONSILLECTOMY     Social History   Social History  . Marital status: Married    Spouse name: N/A  . Number of children: N/A  . Years of education: N/A   Occupational History  . disabled    Social History Main Topics  . Smoking status: Current Every Day Smoker  Packs/day: 1.50    Years: 30.00    Types: Cigarettes  . Smokeless tobacco: Never Used     Comment: 2 cigarettes a day  . Alcohol use No  . Drug use: No  . Sexual activity: Yes    Birth control/ protection: Post-menopausal   Other Topics Concern  . None   Social History Narrative  . None   Family History  Problem Relation Age of Onset  . Coronary artery disease Mother   . Peripheral vascular disease Mother   . Heart disease Mother     Before age 50  . Other Mother     Venous insuffiency  . Diabetes Mother   . Hyperlipidemia Mother   . Hypertension  Mother   . Varicose Veins Mother   . Heart attack Mother     before age 68  . Heart disease Father   . Diabetes Father   . Diabetes Maternal Grandmother   . Diabetes Paternal Grandmother   . Diabetes Paternal Grandfather   . Diabetes Sister   . Hypertension Sister   . Diabetes Brother   . Hypertension Brother    - negative except otherwise stated in the family history section Allergies  Allergen Reactions  . Penicillins Other (See Comments)    Severe Headache (not so sure about this now) Has patient had a PCN reaction causing immediate rash, facial/tongue/throat swelling, SOB or lightheadedness with hypotension: No Has patient had a PCN reaction causing severe rash involving mucus membranes or skin necrosis: No Has patient had a PCN reaction that required hospitalization: No Has patient had a PCN reaction occurring within the last 10 years: No If all of the above answers are "NO", then may proceed with Cephalosporin use.    Prior to Admission medications   Medication Sig Start Date End Date Taking? Authorizing Provider  albuterol (PROVENTIL) 2 MG tablet Take 2 mg by mouth 3 (three) times daily as needed for shortness of breath.    Yes Historical Provider, MD  aspirin 81 MG tablet Take 81 mg by mouth daily.    Yes Historical Provider, MD  cetirizine (ZYRTEC) 10 MG tablet Take 10 mg by mouth daily as needed for allergies. Reported on 03/27/2016   Yes Historical Provider, MD  Choline Fenofibrate (FENOFIBRIC ACID) 45 MG CPDR Take 45 mg by mouth daily. Reported on 03/18/2016 10/30/15  Yes Historical Provider, MD  cyclobenzaprine (FLEXERIL) 10 MG tablet Limit 1 tablet by mouth 1  to  3 times per day if tolerated Patient taking differently: Take 10 mg by mouth 3 (three) times daily as needed for muscle spasms.  07/18/16  Yes Mohammed Kindle, MD  dicyclomine (BENTYL) 10 MG capsule Take 10 mg by mouth 4 (four) times daily as needed for spasms. 08/08/16 08/08/17 Yes Historical Provider, MD    furosemide (LASIX) 40 MG tablet Take 40 mg by mouth 2 (two) times daily as needed for fluid.    Yes Historical Provider, MD  HUMALOG KWIKPEN 100 UNIT/ML KiwkPen Inject 10 Units into the skin 3 (three) times daily. Per sliding scale 11/11/15  Yes Historical Provider, MD  HYDROcodone-acetaminophen (NORCO) 10-325 MG tablet Limit 1 tablet by mouth per day or twice per day if tolerated Patient taking differently: Take 1 tablet by mouth See admin instructions. Every 8-12 hours 07/18/16  Yes Mohammed Kindle, MD  LANTUS SOLOSTAR 100 UNIT/ML Solostar Pen Inject 36 Units into the skin 2 (two) times daily.  09/15/14  Yes Historical Provider, MD  meclizine (ANTIVERT) 25 MG  tablet Take 25 mg by mouth 3 (three) times daily as needed for dizziness. 11/06/16  Yes Historical Provider, MD  omeprazole (PRILOSEC) 40 MG capsule Take 40 mg by mouth daily. 11/06/16  Yes Historical Provider, MD  pregabalin (LYRICA) 100 MG capsule Limit 1 tab by mouth twice a day to 3 times a day if tolerated Patient taking differently: Take 100 mg by mouth 3 (three) times daily.  07/18/16  Yes Mohammed Kindle, MD  pseudoephedrine-guaifenesin [facility] D) 60-600 MG per tablet Take 1 tablet by mouth every 12 (twelve) hours as needed for congestion. Reported on 04/08/2016   Yes Historical Provider, MD   No results found. - pertinent xrays, CT, MRI studies were reviewed and independently interpreted  Positive ROS: All other systems have been reviewed and were otherwise negative with the exception of those mentioned in the HPI and as above.  Physical Exam: General: Alert, no acute distress Psychiatric: Patient is competent for consent with normal mood and affect Lymphatic: No axillary or cervical lymphadenopathy Cardiovascular: Patient has  edema bilateral lower extremities. Respiratory: No cyanosis, no use of accessory musculature GI: No organomegaly, abdomen is soft and non-tender  Skin: On examination patient has a ulcer approximately 2 cm  in diameter the lateral malleolus. There is fibrinous exudative tissue in the wound bed. This was removed patient has exposed fibula with granulation tissue no ascending cellulitis no drainage.   Neurologic: Patient does not have protective sensation bilateral lower extremities.   MUSCULOSKELETAL:  Examination patient has pitting edema bilateral lower extremities there is no dermatitis or cellulitis. She does not have palpable pulses in either foot.  Review of the MRI scan shows chronic osteomyelitis of the distal fibula was made 4-5 cm.  Patient has chronically uncontrolled diabetes with most recent hemoglobin A1c 11.2.  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  Plan: Plan: Discussed with the patient that she would require resection of the distal fibula. Patient became very tearful about having surgery on her ankle. She states that she would have to wait at least 1 week prior to considering intervention. Patient would require resection of the distal fibula with tibial calcaneal fusion. With patient's current arterial insufficiency she would not be a candidate for foot salvage intervention. We'll need to wait and see if patient can achieve sufficient arterial inflow to consider foot salvage intervention. Patient states that she will be leaving the hospital after her vascular procedure today. I will follow-up in my office if patient is discharged.  Thank you for the consult and the opportunity to see Ms. Berneda Rose, [place] [phone] 6:31 AM

## 2016-12-31 NOTE — Progress Notes (Addendum)
Patient NPO all day and her CBG was 59. Per SO patient received Dextrose 50% - 50 ml and CBG went up to 146. Will continue to monitor.

## 2016-12-31 NOTE — Progress Notes (Signed)
This AM patient's CBG was 69 (patient being NPO). She received Dextrose 50% 25 ml per SO and CBG went up to 109. Will continue to monitor.

## 2016-12-31 NOTE — Progress Notes (Signed)
MD was notified about patient's BP being low 79/49; patient asymptomatic, resting quietly in bed. Per MD will continue to monitor the patient and continue IV fluids.

## 2016-12-31 NOTE — Consult Note (Signed)
WOC follow up Patient's surgical debridement site on the right buttock is being managed with NPWT VAC with oversight from the surgical team and dressing changes per the bedside nursing staff. Patient's chronic right medial ankle wound is being followed by vascular and orthopedics.     Re consult if needed, will not follow at this time. Thanks  Jazman Reuter [employer], RN,CWOCN, CNS [phone])

## 2017-01-01 LAB — HEMOGLOBIN A1C
Hgb A1c MFr Bld: 10.8 % — ABNORMAL HIGH (ref 4.8–5.6)
Mean Plasma Glucose: 263 mg/dL

## 2017-01-01 LAB — HCV COMMENT:

## 2017-01-01 LAB — HIV ANTIBODY (ROUTINE TESTING W REFLEX): HIV Screen 4th Generation wRfx: NONREACTIVE

## 2017-01-01 LAB — HEPATITIS B SURFACE ANTIGEN: HEP B S AG: NEGATIVE

## 2017-01-01 LAB — GLUCOSE, CAPILLARY
GLUCOSE-CAPILLARY: 116 mg/dL — AB (ref 65–99)
GLUCOSE-CAPILLARY: 134 mg/dL — AB (ref 65–99)
Glucose-Capillary: 133 mg/dL — ABNORMAL HIGH (ref 65–99)

## 2017-01-01 LAB — HEPATITIS C ANTIBODY (REFLEX)

## 2017-01-01 MED ORDER — SODIUM CHLORIDE 0.9 % IV BOLUS (SEPSIS)
500.0000 mL | Freq: Once | INTRAVENOUS | Status: AC
  Administered 2017-01-01: 500 mL via INTRAVENOUS

## 2017-01-01 MED ORDER — MIDODRINE HCL 10 MG PO TABS: 10.0000 mg | ORAL_TABLET | Freq: Three times a day (TID) | ORAL | 0 refills | Status: DC

## 2017-01-01 MED ORDER — SIMETHICONE 80 MG PO CHEW
80.0000 mg | CHEWABLE_TABLET | Freq: Four times a day (QID) | ORAL | Status: DC | PRN
  Administered 2017-01-01: 80 mg via ORAL
  Filled 2017-01-01: qty 1

## 2017-01-01 NOTE — Progress Notes (Signed)
Pt  Complain of feeling light headed CBG 116,  BP 67/40  P 84 DR choi was paged gave an order forr 500 N/S fluid bolus BP 99/60 after the bolus Kelly the morning shift nurse has been made aware

## 2017-01-01 NOTE — Progress Notes (Signed)
[place] notified of DC today

## 2017-01-01 NOTE — Consult Note (Addendum)
[facility] nurse arrived to replace NPWT VAC dressing this am.  Staff attempted to change and was unsuccessful yesterday.  Today when I arrived patient informs me she will be DC to home today.  She has a Medela NPWT Device from [facility] agency which is not compatible with the hospital device.  Will not place new KCI VAC dressing today since it will not hook to her home device for transfer.  She is pending procedure with [facility] surgeon today and then to be DC. She informed me that her husband can bring her device to the room. However this [place] nurse will be leaving this campus at 1230pm today.  I have suggested her HHRN replace her dressing and tx this evening upon her DC to home, she reports the [facility] will not come out after hours.   I have notified the bedside nurse to maintain normal saline moist dressing.  I will return to the room between 09-1129 to place Medela pump and dressing if available.  If patient is transferred to the cath lab before I can place new dressing orders have been written for her to have saline moist dressing replaced and she can have HHRN replace Medela NPWT dressing within the next 24 hours at home.  [place], [place]  [id number] Addendum: Patient to DC to home today per bedside nursing staff. Replaced Medela dressing and NPWT pump that husband brought from home. Patient tolerated well. Notified the bedside nurse.  [place], [place]

## 2017-01-01 NOTE — Progress Notes (Signed)
Inpatient Diabetes Program Recommendations  AACE/ADA: New Consensus Statement on Inpatient Glycemic Control (2015)  Target Ranges:  Prepandial:   less than 140 mg/dL      Peak postprandial:   less than 180 mg/dL (1-2 hours)      Critically ill patients:  140 - 180 mg/dL   Results for AYRIEL, TEXIDOR (MRN [id number]) as of 01/01/2017 10:21  Ref. Range 12/31/2016 08:13 12/31/2016 09:43 12/31/2016 12:28 12/31/2016 16:00 12/31/2016 17:08 12/31/2016 22:23 01/01/2017 06:59  Glucose-Capillary Latest Ref Range: 65 - 99 mg/dL 69 109 (H) 91 59 (L) 146 (H) 232 (H) 116 (H)   Review of Glycemic Control  Diabetes history: DM 2 Outpatient Diabetes medications: Lantus 36 BID, Humalog 10 units TID Current orders for Inpatient glycemic control: Lantus 40 units BID, Novolog Moderate TID + HS scale  Inpatient Diabetes Program Recommendations:   Patient having mild hypoglycemia. Please consider decreasing Lantus to 36-38 units BID. Home dose is 36 units BID.  Thanks,  Tama Headings RN, MSN, [facility] Inpatient Diabetes Coordinator Team Pager [phone] (8a-5p)

## 2017-01-01 NOTE — Discharge Summary (Signed)
Physician Discharge Summary  [name] NFA:[id number] DOB: 06/06/1966 DOA: 12/23/2016  PCP: [name], MD  Admit date: 12/23/2016 Discharge date: 01/01/2017  Admitted From: Home Disposition:  Home  Recommendations for Outpatient Follow-up:  1. Follow up with PCP in 1 week 2. Follow up with Vascular surgery Dr. Trula Slade for angiogram as outpatient. Scheduled for 2/22 3. Follow up with Orthopedic surgery Dr. Sharol Given for bone biopsy. Please obtain wound cultures at the time of biopsy.  4. Follow up with Infectious disease Dr. [name] (or one of his partners) after bone biopsy and wound cultures are obtained.  5. Continue wound care with [facility] in [place] with Dr. Nils Pyle on discharge. 6. Please obtain CBC in 1 week   Home Health: RN  Equipment/Devices: None   Discharge Condition: Stable CODE STATUS: Full  Diet recommendation: Heart healthy, carb modified   Brief/Interim Summary: From H&P: [name] is a 51 y.o. female with medical history significant of PAD, DM with neuropathy, chronic pain, HLD, and depression presenting because she went to her pain management doctor and he sent her in because her BP was low. She has a buttock wound and has been hurting with that as well as her back, neck. 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  Was sent home with wound vac, had it for one to several visits and then it was removed by the [facility] wound center.  When she went time before last, she was sent to the ER for debridement.  Dr. Rosendo Gros evaluated the patient in the ER and did a bedside debridement.  She had borderline hypotension but it resolved with IVF and her lactate was normal. She reports that the wound vac was replaced last Saturday. She was admitted for sepsis with suspected diabetic foot ulcer etiology vs other and was treated with empiric IV antibiotics. She was admitted to the ICU due to low BP .    Interim: MRI right foot was obtained which revealed "Cellulitis about the lateral ankle with a soft tissue wound overlying the lateral malleolus. Edema and enhancement in the distal 4.5-5 cm of the fibula are consistent with osteomyelitis." ABI also obtained showed severe arterial disease of right lower extremity. Vascular surgery was consulted. Vascular US showed "patent right femoral popliteal bypass graft. 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 Possible elevated velocities at distal right iliac 50-74% stenosis." General surgery was also consulted to evaluate patient's chronic sacral ulcer and recommended outpatient wound care follow up. This was not thought to be the source of her sepsis. Angiogram was initially scheduled but was postponed and ultimately recommended as an outpatient procedure. Orthopedic surgery was consulted and will see patient after vascular angiogram for bone biopsy. Infectious disease planning to follow as outpatient as well once bone biopsy can guide further management.   Subjective on day of discharge: Patient is adamant about going home and following up for her pending right foot work up and procedure as outpatient. She has no complaints currently. Denies any fevers, chills, CP, SOB, nausea, vomiting.   Discharge Diagnoses:  Principal Problem:   Osteomyelitis of ankle ([place]) Active Problems:   PAD (peripheral artery disease) (H[facility])   Sepsis ([place])   DM2 (diabetes mellitus, type 2) (HCC)   Hypotension   IDDM (insulin dependent diabetes mellitus) ([place])   AKI (acute kidney injury) ([place])   Malnutrition ([place])   Thrush   Chronic pain  Buttock wound, left, subsequent encounter   Elevated lactic acid level  Septic shock secondary to right osteomyelitis, likely chronic osteo -Shock improved with fluid resuscitation, pressors and empiric Abx in ICU. Resumed home midodrine. Cut down and stopped Hydrocortisone, no evidence of AI, random cortisol 11.3 -Blood Cx  negative, urine cx polymicrobial -Per [facility] -sacral wound healing unlikely to be source of sepsis. Follow with wound care as outpatient -MRI R ankle concerning for cellulitis around the lateral ankle and possible osteo of distal 4.5-5 cm of the fibula -Severe PAD with ABI of 0.45, followed by Dr.Brabham. Vascular surgery planning for angiogram as outpatient -Orthopedic surgery planning for bone biopsy as outpatient -ID will follow as outpatient after bone biopsy. Please obtain wound culture at time of biopsy. Antibiotics stopped to obtain bone biopsy results off antibiotic tx.  -BP low this morning, but patient asymptomatic. Continue midodrine.    Hx of necrotizing fasciitis of the right buttocks; status post debridement 09/30/16,Dr. Georganna Skeans -Appreciate [facility] input, wound appears to be healing well -Continue wound care, followed by Nils Pyle at [facility] in [employer]  Chronic back and neck pain  -Chronic pain management   Type 2 diabetes -Continue insulin  COPD/ tobacco use started age 24 -Stable, nebs PRN   Discharge Instructions  Discharge Instructions    Diet - low sodium heart healthy    Complete by:  As directed    Increase activity slowly    Complete by:  As directed      Allergies as of 01/01/2017      Reactions   Penicillins Other (See Comments)   Severe Headache (not so sure about this now) Has patient had a PCN reaction causing immediate rash, facial/tongue/throat swelling, SOB or lightheadedness with hypotension: No Has patient had a PCN reaction causing severe rash involving mucus membranes or skin necrosis: No Has patient had a PCN reaction that required hospitalization: No Has patient had a PCN reaction occurring within the last 10 years: No If all of the above answers are "NO", then may proceed with Cephalosporin use.      Medication List    TAKE these medications   albuterol 2 MG tablet Commonly known as:  PROVENTIL Take 2 mg by mouth 3 (three) times  daily as needed for shortness of breath.   aspirin 81 MG tablet Take 81 mg by mouth daily.   cetirizine 10 MG tablet Commonly known as:  ZYRTEC Take 10 mg by mouth daily as needed for allergies. Reported on 03/27/2016   cyclobenzaprine 10 MG tablet Commonly known as:  FLEXERIL Limit 1 tablet by mouth 1  to  3 times per day if tolerated What changed:  how much to take  how to take this  when to take this  reasons to take this  additional instructions   dicyclomine 10 MG capsule Commonly known as:  BENTYL Take 10 mg by mouth 4 (four) times daily as needed for spasms.   Fenofibric Acid 45 MG Cpdr Take 45 mg by mouth daily. Reported on 03/18/2016   furosemide 40 MG tablet Commonly known as:  LASIX Take 40 mg by mouth 2 (two) times daily as needed for fluid.   HUMALOG KWIKPEN 100 UNIT/ML KiwkPen Generic drug:  insulin lispro Inject 10 Units into the skin 3 (three) times daily. Per sliding scale   HYDROcodone-acetaminophen 10-325 MG tablet Commonly known as:  NORCO Limit 1 tablet by mouth per day or twice per day if tolerated What changed:  how much to  take  how to take this  when to take this  additional instructions   LANTUS SOLOSTAR 100 UNIT/ML Solostar Pen Generic drug:  Insulin Glargine Inject 36 Units into the skin 2 (two) times daily.   meclizine 25 MG tablet Commonly known as:  ANTIVERT Take 25 mg by mouth 3 (three) times daily as needed for dizziness.   midodrine 10 MG tablet Commonly known as:  PROAMATINE Take 1 tablet (10 mg total) by mouth 3 (three) times daily with meals.   omeprazole 40 MG capsule Commonly known as:  PRILOSEC Take 40 mg by mouth daily.   pregabalin 100 MG capsule Commonly known as:  LYRICA Limit 1 tab by mouth twice a day to 3 times a day if tolerated What changed:  how much to take  how to take this  when to take this  additional instructions   pseudoephedrine-guaifenesin 60-600 MG 12 hr tablet Commonly known as:   MUCINEX D Take 1 tablet by mouth every 12 (twelve) hours as needed for congestion. Reported on 04/08/2016      Follow-up Information    Newt Minion, MD Follow up in 1 week(s).   Specialty:  Orthopedic Surgery Contact information: [place] [address] [phone]        [name], MD. Schedule an appointment as soon as possible for a visit in 1 week(s).   Specialty:  Family Medicine Contact information: [place] [address] [place], [place]. Schedule an appointment as soon as possible for a visit.   Specialty:  Infectious Diseases Why:  Make appointment with Dr. [name] of his partner once bone biopsy is obtained by orthopedic surgery  Contact information: [address] [address] [phone]        [facility] with Dr. Nils Pyle. Schedule an appointment as soon as possible for a visit in 1 week(s).   Contact information: in [place]         Allergies  Allergen Reactions  . Penicillins Other (See Comments)    Severe Headache (not so sure about this now) Has patient had a PCN reaction causing immediate rash, facial/tongue/throat swelling, SOB or lightheadedness with hypotension: No Has patient had a PCN reaction causing severe rash involving mucus membranes or skin necrosis: No Has patient had a PCN reaction that required hospitalization: No Has patient had a PCN reaction occurring within the last 10 years: No If all of the above answers are "NO", then may proceed with Cephalosporin use.     Consultations:  PCCM  Vascular surgery  ID  Orthopedic surgery  General surgery    Procedures/Studies: Mr Ankle Right W Wo Contrast  Result Date: 12/24/2016 CLINICAL DATA:  Lateral right ankle wound of unknown duration. Sepsis. Question abscess or osteomyelitis. EXAM: MRI OF THE RIGHT ANKLE WITHOUT AND WITH CONTRAST TECHNIQUE: Multiplanar, multisequence MR imaging of  the ankle was performed before and after the administration of intravenous contrast. CONTRAST:  13 ml MULTIHANCE GADOBENATE DIMEGLUMINE 529 MG/ML IV SOLN COMPARISON:  Plain films right ankle 10/03/2016 FINDINGS: Soft tissue wound is seen over the lateral malleolus. There is subcutaneous edema and enhancement about the lateral aspect of the ankle compatible cellulitis. No abscess. TENDONS Peroneal: Intact. Posteromedial: Intact. Anterior: Intact. Achilles: Mild thickening of the distal tendon is compatible tendinopathy without tear. Plantar Fascia: Intact. LIGAMENTS Lateral: Intact. Medial: Intact. CARTILAGE Ankle Joint: No effusion or osteochondral lesion. Subtalar Joints/Sinus  Tarsi: Negative. Bones: Intense edema and enhancement are seen in the distal 4.5-5 cm of the fibula consistent with osteomyelitis. Other: None. IMPRESSION: IMPRESSION Cellulitis about the lateral ankle with a soft tissue wound overlying the lateral malleolus. Edema and enhancement in the distal 4.5-5 cm of the fibula are consistent with osteomyelitis. Negative for abscess. Mild appearing Achilles tendinopathy without tear. Electronically Signed   By: Inge Rise M.D.   On: 12/24/2016 11:49   Dg Chest Portable 1 View  Result Date: 12/23/2016 CLINICAL DATA:  Hypotension. EXAM: PORTABLE CHEST 1 VIEW COMPARISON:  11/26/2016, 10/01/2016 and earlier. FINDINGS: Cardiac silhouette upper normal in size for the AP portable technique, unchanged. Thoracic aorta mildly atherosclerotic, unchanged. Hilar and mediastinal contours otherwise unremarkable. Mildly prominent bronchovascular markings diffusely, unchanged. No new pulmonary parenchymal abnormalities. No visible pleural effusions. IMPRESSION: Stable mild changes of chronic bronchitis and/or asthma. No acute cardiopulmonary disease. Electronically Signed   By: Evangeline Dakin M.D.   On: 12/23/2016 19:06   ABI completed Right ABI of 0.45 is suggestive of severe arterial occlusive disease at  rest.  Left ABI of 0.89 is suggestive of mild arterial occlusive disease at rest.   Vascular US right LE Patent right femoral popliteal bypass graft. 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 Possible elevated velocities at distal right iliac 50-74% stenosis.   Discharge Exam: Vitals:   01/01/17 0752 01/01/17 1324  BP: 99/60 (!) 98/50  Pulse: 88 85  Resp:  18  Temp:  98.2 F (36.8 C)   Vitals:   01/01/17 0517 01/01/17 0701 01/01/17 0752 01/01/17 1324  BP: (!) 100/56 (!) 67/40 99/60 (!) 98/50  Pulse: 89 84 88 85  Resp: 18   18  Temp: 97.7 F (36.5 C)   98.2 F (36.8 C)  TempSrc: Oral     SpO2: 99%   100%  Weight:      Height:        General: Pt is alert, awake, not in acute distress Cardiovascular: RRR, S1/S2 +, no rubs, no gallops Respiratory: CTA bilaterally, no wheezing, no rhonchi Abdominal: Soft, NT, ND, bowel sounds + Extremities: +trace edema, no cyanosis Skin: right ankle wounds are covered, sacral wound is pink, yellow/gray with scant drainage     The results of significant diagnostics from this hospitalization (including imaging, microbiology, ancillary and laboratory) are listed below for reference.     Microbiology: Recent Results (from the past 240 hour(s))  Culture, blood (Routine x 2)     Status: None   Collection Time: 12/23/16  6:55 PM  Result Value Ref Range Status   Specimen Description BLOOD RIGHT FOREARM  Final   Special Requests IN PEDIATRIC BOTTLE 2CC  Final   Culture NO GROWTH 5 DAYS  Final   Report Status 12/28/2016 FINAL  Final  Culture, blood (Routine x 2)     Status: None   Collection Time: 12/23/16  8:20 PM  Result Value Ref Range Status   Specimen Description BLOOD RIGHT HAND  Final   Special Requests BOTTLES DRAWN AEROBIC AND ANAEROBIC 5CC  Final   Culture NO GROWTH 5 DAYS  Final   Report Status 12/28/2016 FINAL  Final  Urine culture     Status: Abnormal   Collection Time: 12/23/16 10:21 PM  Result Value Ref Range  Status   Specimen Description URINE, RANDOM  Final   Special Requests NONE  Final   Culture MULTIPLE SPECIES PRESENT, SUGGEST RECOLLECTION (A)  Final   Report Status 12/25/2016 FINAL  Final  MRSA PCR Screening     Status: None   Collection Time: 12/24/16  4:19 AM  Result Value Ref Range Status   MRSA by PCR NEGATIVE NEGATIVE Final    Comment:        The GeneXpert MRSA Assay (FDA approved for NASAL specimens only), is one component of a comprehensive MRSA colonization surveillance program. It is not intended to diagnose MRSA infection nor to guide or monitor treatment for MRSA infections.      Labs: BNP (last 3 results) No results for input(s): BNP in the last 8760 hours. Basic Metabolic Panel:  Recent Labs Lab 12/26/16 0418 12/28/16 1011 12/30/16 1153 12/31/16 0004 12/31/16 0917  NA 140 140 137 136 138  K 3.0* 4.4 4.7 5.0 4.6  CL 98* 103 100* 98* 101  CO2 30 27 29 30 27   GLUCOSE 82 120* 163* 169* 137*  BUN 17 21* 24* 23* 23*  CREATININE 0.81 0.57 0.59 0.70 0.64  CALCIUM 9.0 9.4 9.6 9.4 9.8   Liver Function Tests: No results for input(s): AST, ALT, ALKPHOS, BILITOT, PROT, ALBUMIN in the last 168 hours. No results for input(s): LIPASE, AMYLASE in the last 168 hours. No results for input(s): AMMONIA in the last 168 hours. CBC:  Recent Labs Lab 12/26/16 0418 12/30/16 1153 12/31/16 0917  WBC 16.3* 11.7* 12.2*  HGB 10.4* 11.1* 11.0*  HCT 33.5* 36.3 35.8*  MCV 82.5 83.8 84.6  PLT 455* 443* 406*   Cardiac Enzymes: No results for input(s): CKTOTAL, CKMB, CKMBINDEX, TROPONINI in the last 168 hours. BNP: Invalid input(s): POCBNP CBG:  Recent Labs Lab 12/31/16 1708 12/31/16 2223 01/01/17 0659 01/01/17 0749 01/01/17 1154  GLUCAP 146* 232* 116* 133* 134*   D-Dimer No results for input(s): DDIMER in the last 72 hours. Hgb A1c  Recent Labs  12/31/16 0917  HGBA1C 10.8*   Lipid Profile No results for input(s): CHOL, HDL, LDLCALC, TRIG, CHOLHDL,  LDLDIRECT in the last 72 hours. Thyroid function studies No results for input(s): TSH, T4TOTAL, T3FREE, THYROIDAB in the last 72 hours.  Invalid input(s): FREET3 Anemia work up No results for input(s): VITAMINB12, FOLATE, FERRITIN, TIBC, IRON, RETICCTPCT in the last 72 hours. Urinalysis    Component Value Date/Time   COLORURINE STRAW (A) 12/23/2016 2221   APPEARANCEUR CLEAR 12/23/2016 2221   LABSPEC 1.002 (L) 12/23/2016 2221   PHURINE 6.0 12/23/2016 2221   GLUCOSEU >=500 (A) 12/23/2016 2221   HGBUR NEGATIVE 12/23/2016 2221   BILIRUBINUR NEGATIVE 12/23/2016 2221   KETONESUR NEGATIVE 12/23/2016 2221   PROTEINUR NEGATIVE 12/23/2016 2221   UROBILINOGEN 0.2 06/25/2011 1707   NITRITE NEGATIVE 12/23/2016 2221   LEUKOCYTESUR NEGATIVE 12/23/2016 2221   Sepsis Labs Invalid input(s): PROCALCITONIN,  WBC,  LACTICIDVEN Microbiology Recent Results (from the past 240 hour(s))  Culture, blood (Routine x 2)     Status: None   Collection Time: 12/23/16  6:55 PM  Result Value Ref Range Status   Specimen Description BLOOD RIGHT FOREARM  Final   Special Requests IN PEDIATRIC BOTTLE 2CC  Final   Culture NO GROWTH 5 DAYS  Final   Report Status 12/28/2016 FINAL  Final  Culture, blood (Routine x 2)     Status: None   Collection Time: 12/23/16  8:20 PM  Result Value Ref Range Status   Specimen Description BLOOD RIGHT HAND  Final   Special Requests BOTTLES DRAWN AEROBIC AND ANAEROBIC 5CC  Final   Culture NO GROWTH 5 DAYS  Final   Report Status 12/28/2016  FINAL  Final  Urine culture     Status: Abnormal   Collection Time: 12/23/16 10:21 PM  Result Value Ref Range Status   Specimen Description URINE, RANDOM  Final   Special Requests NONE  Final   Culture MULTIPLE SPECIES PRESENT, SUGGEST RECOLLECTION (A)  Final   Report Status 12/25/2016 FINAL  Final  MRSA PCR Screening     Status: None   Collection Time: 12/24/16  4:19 AM  Result Value Ref Range Status   MRSA by PCR NEGATIVE NEGATIVE Final     Comment:        The GeneXpert MRSA Assay (FDA approved for NASAL specimens only), is one component of a comprehensive MRSA colonization surveillance program. It is not intended to diagnose MRSA infection nor to guide or monitor treatment for MRSA infections.      Time coordinating discharge: 60 minutes  SIGNED:  Dessa Phi, DO [name] Pager [phone]  If 7PM-7AM, please contact night-coverage [email or web address] Password [email or web address] 01/01/2017, 1:56 PM

## 2017-01-01 NOTE — Care Management Important Message (Signed)
Important Message  Patient Details  Name: [name] MRN: [id number] Date of Birth: 06-22-1966   Medicare Important Message Given:  Yes    Carles Collet, RN 01/01/2017, 11:09 AM

## 2017-01-02 ENCOUNTER — Other Ambulatory Visit: Payer: Self-pay

## 2017-01-02 ENCOUNTER — Telehealth: Payer: Self-pay

## 2017-01-02 NOTE — Telephone Encounter (Signed)
-----   Message from Jamie Mitchell, MD sent at 01/01/2017  8:47 PM EST ----- Patient was discharged today.  I had to cancel her angio yesterday.  Please conact her and place her on the angio schedule for this Tuesday.  Thanks

## 2017-01-02 NOTE — Telephone Encounter (Signed)
Contacted pt. to reschedule Aortogram with bilat. R/O; poss. Intervention.  Procedure scheduled for 01/07/17 @ 1:30 PM.  Instructions given; pt. Verb. Understanding.

## 2017-01-07 ENCOUNTER — Encounter (HOSPITAL_COMMUNITY)
Admission: RE | Disposition: A | Discharge status: Home / Self Care | Payer: Self-pay | Source: Ambulatory Visit | Attending: Surgery

## 2017-01-07 ENCOUNTER — Other Ambulatory Visit: Payer: Self-pay | Admitting: *Deleted

## 2017-01-07 ENCOUNTER — Ambulatory Visit (HOSPITAL_COMMUNITY)
Admission: RE | Admit: 2017-01-07 | Discharge: 2017-01-07 | Disposition: A | Discharge status: Home / Self Care | Payer: Medicare Other | Source: Ambulatory Visit | Attending: Surgery | Admitting: Surgery

## 2017-01-07 DIAGNOSIS — Z95820 Peripheral vascular angioplasty status with implants and grafts: Secondary | ICD-10-CM | POA: Diagnosis not present

## 2017-01-07 DIAGNOSIS — I739 Peripheral vascular disease, unspecified: Secondary | ICD-10-CM

## 2017-01-07 DIAGNOSIS — L97319 Non-pressure chronic ulcer of right ankle with unspecified severity: Secondary | ICD-10-CM | POA: Insufficient documentation

## 2017-01-07 DIAGNOSIS — Z01810 Encounter for preprocedural cardiovascular examination: Secondary | ICD-10-CM

## 2017-01-07 DIAGNOSIS — I70233 Atherosclerosis of native arteries of right leg with ulceration of ankle: Secondary | ICD-10-CM | POA: Diagnosis present

## 2017-01-07 HISTORY — PX: ABDOMINAL AORTOGRAM W/LOWER EXTREMITY: CATH118223

## 2017-01-07 LAB — POCT I-STAT, CHEM 8
BUN: 27 mg/dL — ABNORMAL HIGH (ref 6–20)
CALCIUM ION: 1.08 mmol/L — AB (ref 1.15–1.40)
CREATININE: 0.7 mg/dL (ref 0.44–1.00)
Chloride: 98 mmol/L — ABNORMAL LOW (ref 101–111)
Glucose, Bld: 335 mg/dL — ABNORMAL HIGH (ref 65–99)
HEMATOCRIT: 41 % (ref 36.0–46.0)
HEMOGLOBIN: 13.9 g/dL (ref 12.0–15.0)
Potassium: 4.5 mmol/L (ref 3.5–5.1)
Sodium: 135 mmol/L (ref 135–145)
TCO2: 29 mmol/L (ref 0–100)

## 2017-01-07 LAB — GLUCOSE, CAPILLARY
GLUCOSE-CAPILLARY: 200 mg/dL — AB (ref 65–99)
Glucose-Capillary: 119 mg/dL — ABNORMAL HIGH (ref 65–99)

## 2017-01-07 SURGERY — ABDOMINAL AORTOGRAM W/LOWER EXTREMITY

## 2017-01-07 MED ORDER — ONDANSETRON HCL 4 MG/2ML IJ SOLN: 4.0000 mg | Freq: Four times a day (QID) | INTRAMUSCULAR | Status: DC | PRN

## 2017-01-07 MED ORDER — LIDOCAINE HCL (PF) 1 % IJ SOLN
INTRAMUSCULAR | Status: DC | PRN
  Administered 2017-01-07: 14 mL

## 2017-01-07 MED ORDER — MIDAZOLAM HCL 2 MG/2ML IJ SOLN
INTRAMUSCULAR | Status: AC
  Filled 2017-01-07: qty 2

## 2017-01-07 MED ORDER — FENTANYL CITRATE (PF) 100 MCG/2ML IJ SOLN
INTRAMUSCULAR | Status: AC
  Administered 2017-01-07: 25 ug via INTRAVENOUS
  Filled 2017-01-07: qty 2

## 2017-01-07 MED ORDER — ALUM & MAG HYDROXIDE-SIMETH 200-200-20 MG/5ML PO SUSP
15.0000 mL | ORAL | Status: DC | PRN
  Filled 2017-01-07: qty 30

## 2017-01-07 MED ORDER — MORPHINE SULFATE (PF) 4 MG/ML IV SOLN
INTRAVENOUS | Status: AC
  Administered 2017-01-07: 4 mg
  Filled 2017-01-07: qty 1

## 2017-01-07 MED ORDER — MIDAZOLAM HCL 2 MG/2ML IJ SOLN
INTRAMUSCULAR | Status: DC | PRN
  Administered 2017-01-07: 2 mg via INTRAVENOUS

## 2017-01-07 MED ORDER — MORPHINE SULFATE (PF) 10 MG/ML IV SOLN
2.0000 mg | INTRAVENOUS | Status: DC | PRN
  Administered 2017-01-07: 4 mg via INTRAVENOUS

## 2017-01-07 MED ORDER — SODIUM CHLORIDE 0.9 % IV SOLN: 500.0000 mL | Freq: Once | INTRAVENOUS | Status: DC | PRN

## 2017-01-07 MED ORDER — SODIUM CHLORIDE 0.9 % IV SOLN
INTRAVENOUS | Status: DC
  Administered 2017-01-07: 13:00:00 via INTRAVENOUS

## 2017-01-07 MED ORDER — OXYCODONE HCL 5 MG PO TABS
5.0000 mg | ORAL_TABLET | ORAL | Status: DC | PRN
  Administered 2017-01-07: 5 mg via ORAL

## 2017-01-07 MED ORDER — DOCUSATE SODIUM 100 MG PO CAPS
100.0000 mg | ORAL_CAPSULE | Freq: Every day | ORAL | Status: DC
  Filled 2017-01-07: qty 1

## 2017-01-07 MED ORDER — METOPROLOL TARTRATE 5 MG/5ML IV SOLN: 2.0000 mg | INTRAVENOUS | Status: DC | PRN

## 2017-01-07 MED ORDER — ACETAMINOPHEN 325 MG RE SUPP
325.0000 mg | RECTAL | Status: DC | PRN
  Filled 2017-01-07: qty 2

## 2017-01-07 MED ORDER — LABETALOL HCL 5 MG/ML IV SOLN: 10.0000 mg | INTRAVENOUS | Status: DC | PRN

## 2017-01-07 MED ORDER — FENTANYL CITRATE (PF) 100 MCG/2ML IJ SOLN
INTRAMUSCULAR | Status: AC
  Filled 2017-01-07: qty 2

## 2017-01-07 MED ORDER — INSULIN ASPART 100 UNIT/ML ~~LOC~~ SOLN
SUBCUTANEOUS | Status: AC
  Administered 2017-01-07: 10 [IU] via SUBCUTANEOUS
  Filled 2017-01-07: qty 1

## 2017-01-07 MED ORDER — FENTANYL CITRATE (PF) 100 MCG/2ML IJ SOLN
25.0000 ug | Freq: Once | INTRAMUSCULAR | Status: AC
  Administered 2017-01-07: 25 ug via INTRAVENOUS

## 2017-01-07 MED ORDER — OXYCODONE HCL 5 MG PO TABS
ORAL_TABLET | ORAL | Status: AC
  Administered 2017-01-07: 5 mg via ORAL
  Filled 2017-01-07: qty 1

## 2017-01-07 MED ORDER — HYDRALAZINE HCL 20 MG/ML IJ SOLN: 5.0000 mg | INTRAMUSCULAR | Status: DC | PRN

## 2017-01-07 MED ORDER — LIDOCAINE HCL (PF) 1 % IJ SOLN
INTRAMUSCULAR | Status: AC
  Filled 2017-01-07: qty 30

## 2017-01-07 MED ORDER — INSULIN ASPART 100 UNIT/ML ~~LOC~~ SOLN
10.0000 [IU] | Freq: Once | SUBCUTANEOUS | Status: AC
  Administered 2017-01-07: 10 [IU] via SUBCUTANEOUS
  Filled 2017-01-07: qty 0.1

## 2017-01-07 MED ORDER — IODIXANOL 320 MG/ML IV SOLN
INTRAVENOUS | Status: DC | PRN
  Administered 2017-01-07: 150 mL via INTRA_ARTERIAL

## 2017-01-07 MED ORDER — GUAIFENESIN-DM 100-10 MG/5ML PO SYRP
15.0000 mL | ORAL_SOLUTION | ORAL | Status: DC | PRN
  Filled 2017-01-07: qty 15

## 2017-01-07 MED ORDER — HEPARIN (PORCINE) IN NACL 2-0.9 UNIT/ML-% IJ SOLN
INTRAMUSCULAR | Status: DC | PRN
  Administered 2017-01-07: 1000 mL via INTRA_ARTERIAL

## 2017-01-07 MED ORDER — ACETAMINOPHEN 325 MG PO TABS
325.0000 mg | ORAL_TABLET | ORAL | Status: DC | PRN
  Filled 2017-01-07: qty 2

## 2017-01-07 MED ORDER — PHENOL 1.4 % MT LIQD: 1.0000 | OROMUCOSAL | Status: DC | PRN

## 2017-01-07 MED ORDER — FENTANYL CITRATE (PF) 100 MCG/2ML IJ SOLN
INTRAMUSCULAR | Status: DC | PRN
  Administered 2017-01-07 (×2): 25 ug via INTRAVENOUS

## 2017-01-07 SURGICAL SUPPLY — 11 items
CATH OMNI FLUSH 5F 65CM (CATHETERS) ×2 IMPLANT
COVER PRB 48X5XTLSCP FOLD TPE (BAG) IMPLANT
COVER PROBE 5X48 (BAG) ×3
DRAPE ZERO GRAVITY STERILE (DRAPES) ×2 IMPLANT
KIT PV (KITS) ×3 IMPLANT
SHEATH PINNACLE 5F 10CM (SHEATH) ×2 IMPLANT
SYR MEDRAD MARK V 150ML (SYRINGE) ×3 IMPLANT
TRANSDUCER W/STOPCOCK (MISCELLANEOUS) ×3 IMPLANT
TRAY PV CATH (CUSTOM PROCEDURE TRAY) ×3 IMPLANT
WIRE BENTSON .035X145CM (WIRE) ×2 IMPLANT
WIRE MINI STICK MAX (SHEATH) ×2 IMPLANT

## 2017-01-07 NOTE — Progress Notes (Signed)
Site area: Left groin a 5 french arterial sheath was removed by Burman Foster [facility]  Site Prior to Removal:  Level 0  Pressure Applied For 20 MINUTES    Bedrest Beginning at 1720p Manual:   Yes.    Patient Status During Pull:  stable  Post Pull Groin Site:  Level 0  Post Pull Instructions Given:  Yes.    Post Pull Pulses Present:  Yes.    Dressing Applied:  Yes.    Comments:

## 2017-01-07 NOTE — Interval H&P Note (Signed)
History and Physical Interval Note:  01/07/2017 3:41 PM  [name]  has presented today for surgery, with the diagnosis of pvd w/ right ankle ulcer  The various methods of treatment have been discussed with the patient and family. After consideration of risks, benefits and other options for treatment, the patient has consented to  Procedure(s): Abdominal Aortogram w/Lower Extremity (N/A) as a surgical intervention .  The patient's history has been reviewed, patient examined, no change in status, stable for surgery.  I have reviewed the patient's chart and labs.  Questions were answered to the patient's satisfaction.     [name]

## 2017-01-07 NOTE — Op Note (Signed)
    Patient name: [name] MRN: [id number] DOB: 10-09-1966 Sex: female  01/07/2017 Pre-operative Diagnosis: Right leg ulcer Post-operative diagnosis:  Same Surgeon:  [name] Procedure Performed:  1.  Ultrasound-guided access, left femoral artery  2.  Abdominal aortogram  3.  Bilateral lower extremity runoff  4.  Conscious sedation (620 minutes)    Indications:  The patient has a history of bilateral femoral-popliteal bypass graft as well as external iliac stenting on the right and stenting of her bypass graft.  She has developed a nonhealing wound on her right ankle which is actually been there for quite some time but has gotten worse.  She was admitted after necrotizing fasciitis to a Botox wound.  During that admission we were consulted because of her foot ulcer.  Femoral pulse was not palpable.  Ultrasound showed a possible femoral occlusion.  She is here today for further evaluation possible intervention.  Procedure:  The patient was identified in the holding area and taken to room 8.  The patient was then placed supine on the table and prepped and draped in the usual sterile fashion.  A time out was called.  Conscious sedation was administered with the use of IV fentanyl and Versed in a continuous physician nurse monitoring.  Heart rate, blood pressure, and oxygen saturation continuously monitored.  Ultrasound was used to evaluate the left common femoral artery.  It was patent .  A digital ultrasound image was acquired.  A micropuncture needle was used to access the left common femoral artery under ultrasound guidance.  An 018 wire was advanced without resistance and a micropuncture sheath was placed.  The 018 wire was removed and a benson wire was placed.  The micropuncture sheath was exchanged for a 5 french sheath.  An omniflush catheter was advanced over the wire to the level of L-1.  An abdominal angiogram was obtained.  The catheter was then pulled down to the aortic  bifurcation and pelvic angiography in the RAO and LAO position were obtained followed by bilateral lower extremity runoff  Findings:   Aortogram:  No renal artery stenosis.  The infrarenal abdominal aorta is small in caliber but patent without significant stenosis.  The right common and external iliac arteries are occluded.  The left common and external iliac arteries are patent but very small in caliber.  Right Lower Extremity:  The right common femoral and profunda femoral artery are patent throughout their course.  There is a stent within the proximal right femoral bypass graft which is widely patent.  The distal anastomosis is to the below-knee popliteal artery which is widely patent with three-vessel runoff  Left Lower Extremity:  The common femoral and profunda femoral artery are patent throughout their course.  There is a femoral to below-knee popliteal artery bypass graft which is widely patent.  There is three-vessel runoff  Intervention:  None  Impression:  #1  I did not feel that the patient was a good candidate for attempted recannulization, and therefore surgical revascularization will be considered  #2  I would favor an aorto bifemoral bypass graft, given the size of the left iliac artery  #3  patent bilateral femoral-popliteal bypass grafts.   Theotis Burrow, M.D. [facility] Office: [phone] Pager:  [phone]

## 2017-01-07 NOTE — Discharge Instructions (Signed)
Femoral Site Care °Introduction °Refer to this sheet in the next few weeks. These instructions provide you with information about caring for yourself after your procedure. Your health care provider may also give you more specific instructions. Your treatment has been planned according to current medical practices, but problems sometimes occur. Call your health care provider if you have any problems or questions after your procedure. °What can I expect after the procedure? °After your procedure, it is typical to have the following: °· Bruising at the site that usually fades within 1-2 weeks. °· Blood collecting in the tissue (hematoma) that may be painful to the touch. It should usually decrease in size and tenderness within 1-2 weeks. °Follow these instructions at home: °· Take medicines only as directed by your health care provider. °· You may shower 24-48 hours after the procedure or as directed by your health care provider. Remove the bandage (dressing) and gently wash the site with plain soap and water. Pat the area dry with a clean towel. Do not rub the site, because this may cause bleeding. °· Do not take baths, swim, or use a hot tub until your health care provider approves. °· Check your insertion site every day for redness, swelling, or drainage. °· Do not apply powder or lotion to the site. °· Limit use of stairs to twice a day for the first 2-3 days or as directed by your health care provider. °· Do not squat for the first 2-3 days or as directed by your health care provider. °· Do not lift over 10 lb (4.5 kg) for 5 days after your procedure or as directed by your health care provider. °· Ask your health care provider when it is okay to: °¨ Return to work or school. °¨ Resume usual physical activities or sports. °¨ Resume sexual activity. °· Do not drive home if you are discharged the same day as the procedure. Have someone else drive you. °· You may drive 24 hours after the procedure unless otherwise  instructed by your health care provider. °· Do not operate machinery or power tools for 24 hours after the procedure or as directed by your health care provider. °· If your procedure was done as an outpatient procedure, which means that you went home the same day as your procedure, a responsible adult should be with you for the first 24 hours after you arrive home. °· Keep all follow-up visits as directed by your health care provider. This is important. °Contact a health care provider if: °· You have a fever. °· You have chills. °· You have increased bleeding from the site. Hold pressure on the site. °Get help right away if: °· You have unusual pain at the site. °· You have redness, warmth, or swelling at the site. °· You have drainage (other than a small amount of blood on the dressing) from the site. °· The site is bleeding, and the bleeding does not stop after 30 minutes of holding steady pressure on the site. °· Your leg or foot becomes pale, cool, tingly, or numb. °This information is not intended to replace advice given to you by your health care provider. Make sure you discuss any questions you have with your health care provider. °Document Released: 07/08/2014 Document Revised: 04/11/2016 Document Reviewed: 05/24/2014 °© 2017 Elsevier ° °

## 2017-01-07 NOTE — H&P (View-Only) (Signed)
Subjective  -  No overnight changes   Physical Exam:  Right ankle ulcer Non-palpable right femoral pulse       Assessment/Plan:    The patient is well known to me, having undergone multiple procedures to improve her blood flow.  She has a chronic right ankle ulcer, and now we are having difficulty palpating a femoral pulse.  I would like to get a duplex u/s today to evaluate her blood-flow, and depending on what that shows, potentially proceed with angiography tomorrow  [name] 12/26/2016 12:44 PM --  Vitals:   12/25/16 2211 12/26/16 0517  BP: (!) 130/58 115/61  Pulse: 93 83  Resp: 18 20  Temp: 98.2 F (36.8 C) 98.1 F (36.7 C)    Intake/Output Summary (Last 24 hours) at 12/26/16 1244 Last data filed at 12/26/16 0998  Gross per 24 hour  Intake          3041.25 ml  Output             1000 ml  Net          2041.25 ml     Laboratory CBC    Component Value Date/Time   WBC 16.3 (H) 12/26/2016 0418   HGB 10.4 (L) 12/26/2016 0418   HCT 33.5 (L) 12/26/2016 0418   PLT 455 (H) 12/26/2016 0418    BMET    Component Value Date/Time   NA 140 12/26/2016 0418   K 3.0 (L) 12/26/2016 0418   CL 98 (L) 12/26/2016 0418   CO2 30 12/26/2016 0418   GLUCOSE 82 12/26/2016 0418   BUN 17 12/26/2016 0418   CREATININE 0.81 12/26/2016 0418   CALCIUM 9.0 12/26/2016 0418   GFRNONAA >60 12/26/2016 0418   GFRAA >60 12/26/2016 0418    COAG Lab Results  Component Value Date   INR 1.06 12/23/2016   INR 1.06 11/26/2016   INR 1.46 09/30/2016   No results found for: PTT  Antibiotics Anti-infectives    Start     Dose/Rate Route Frequency Ordered Stop   12/24/16 1300  cefTAZidime (FORTAZ) 1 g in dextrose 5 % 50 mL IVPB     1 g 100 mL/hr over 30 Minutes Intravenous Every 8 hours 12/24/16 1122     12/24/16 1130  fluconazole (DIFLUCAN) tablet 100 mg     100 mg Oral Daily 12/24/16 1129 12/26/16 0932   12/24/16 1000  vancomycin (VANCOCIN) 500 mg in sodium chloride 0.9  % 100 mL IVPB     500 mg 100 mL/hr over 60 Minutes Intravenous Every 12 hours 12/24/16 0306     12/24/16 0830  vancomycin (VANCOCIN) 500 mg in sodium chloride 0.9 % 100 mL IVPB  Status:  Discontinued     500 mg 100 mL/hr over 60 Minutes Intravenous Every 12 hours 12/23/16 1959 12/24/16 0250   12/24/16 0600  ceFEPIme (MAXIPIME) 1 g in dextrose 5 % 50 mL IVPB  Status:  Discontinued     1 g 100 mL/hr over 30 Minutes Intravenous Every 8 hours 12/24/16 0305 12/24/16 1122   12/24/16 0430  ceFEPIme (MAXIPIME) 1 g in dextrose 5 % 50 mL IVPB  Status:  Discontinued     1 g 100 mL/hr over 30 Minutes Intravenous Every 8 hours 12/23/16 1959 12/24/16 0250   12/24/16 0400  metroNIDAZOLE (FLAGYL) tablet 500 mg  Status:  Discontinued     500 mg Oral Every 8 hours 12/24/16 0250 12/24/16 1122   12/24/16 0015  fluconazole (DIFLUCAN) IVPB  100 mg  Status:  Discontinued     100 mg 50 mL/hr over 60 Minutes Intravenous Every 24 hours 12/24/16 0007 12/24/16 1129   12/23/16 1945  ceFEPIme (MAXIPIME) 2 g in dextrose 5 % 50 mL IVPB     2 g 100 mL/hr over 30 Minutes Intravenous  Once 12/23/16 1941 12/24/16 0130   12/23/16 1945  vancomycin (VANCOCIN) IVPB 1000 mg/200 mL premix     1,000 mg 200 mL/hr over 60 Minutes Intravenous  Once 12/23/16 1941 12/23/16 2225       V. Leia Alf, M.D. [facility] Office: [phone] Pager:  [phone]

## 2017-01-07 NOTE — Progress Notes (Signed)
Nurse tech tried to get client up to bathroom and client could not stand up; nurse tech called me and I came and client standing at stretcher with nurse tech and client's husband holding client up; helped client back on stretcher and client diaphoretic and CBG checked and B/P checked; client's husband states this happens to her sometimes

## 2017-01-08 ENCOUNTER — Telehealth: Payer: Self-pay | Admitting: Surgery

## 2017-01-08 ENCOUNTER — Encounter (HOSPITAL_COMMUNITY): Payer: Self-pay | Admitting: Surgery

## 2017-01-08 NOTE — Telephone Encounter (Signed)
-----   Message from Mena Goes, RN sent at 01/07/2017  5:02 PM EST ----- Regarding: Cardiac Clearance and Preop Carotid   ----- Message ----- From: Serafina Mitchell, MD Sent: 01/07/2017   4:42 PM To: [facility] Charge Pool  01/07/2017:  Surgeon:  [name] Procedure Performed:  1.  Ultrasound-guided access, left femoral artery  2.  Abdominal aortogram  3.  Bilateral lower extremity runoff  4.  Conscious sedation (620 minutes)   Please schedule the patient to follow with me in the next 2-3 weeks.  Prior to seeing me out like for her to have a cardiology evaluation for preoperative clearance for aortobifemoral bypass graft.  Also when she sees me repeat her carotid Doppler studies.

## 2017-01-08 NOTE — Telephone Encounter (Signed)
Scheduled Carotid for 3/1 and Office visit with [facility] on 3/19. Will notify the patient.

## 2017-01-08 NOTE — Telephone Encounter (Signed)
Scheduled Pre-op clearance appointment for patient. [name] stated that the soonest available new patient appointment isn't until March 8th at the [facility] location. Will notify the patient.

## 2017-01-09 ENCOUNTER — Ambulatory Visit: Payer: Medicare Other | Admitting: Family

## 2017-01-09 ENCOUNTER — Ambulatory Visit (HOSPITAL_COMMUNITY): Payer: Medicare Other

## 2017-01-14 ENCOUNTER — Ambulatory Visit: Payer: Medicare Other | Admitting: Cardiology

## 2017-01-16 ENCOUNTER — Ambulatory Visit (INDEPENDENT_AMBULATORY_CARE_PROVIDER_SITE_OTHER): Payer: Medicare Other | Admitting: Cardiology

## 2017-01-16 ENCOUNTER — Ambulatory Visit (HOSPITAL_COMMUNITY)
Admission: RE | Admit: 2017-01-16 | Discharge: 2017-01-16 | Disposition: A | Discharge status: Home / Self Care | Payer: Medicare Other | Source: Ambulatory Visit | Attending: Vascular Surgery | Admitting: Vascular Surgery

## 2017-01-16 ENCOUNTER — Encounter: Payer: Self-pay | Admitting: Cardiology

## 2017-01-16 VITALS — BP 68/40 | HR 88 | Ht 67.0 in | Wt 140.5 lb

## 2017-01-16 DIAGNOSIS — I739 Peripheral vascular disease, unspecified: Secondary | ICD-10-CM | POA: Diagnosis not present

## 2017-01-16 DIAGNOSIS — I708 Atherosclerosis of other arteries: Secondary | ICD-10-CM

## 2017-01-16 DIAGNOSIS — Z01810 Encounter for preprocedural cardiovascular examination: Secondary | ICD-10-CM | POA: Diagnosis not present

## 2017-01-16 DIAGNOSIS — F17200 Nicotine dependence, unspecified, uncomplicated: Secondary | ICD-10-CM

## 2017-01-16 DIAGNOSIS — I6523 Occlusion and stenosis of bilateral carotid arteries: Secondary | ICD-10-CM | POA: Diagnosis not present

## 2017-01-16 DIAGNOSIS — E784 Other hyperlipidemia: Secondary | ICD-10-CM

## 2017-01-16 DIAGNOSIS — I748 Embolism and thrombosis of other arteries: Secondary | ICD-10-CM | POA: Diagnosis not present

## 2017-01-16 DIAGNOSIS — E7849 Other hyperlipidemia: Secondary | ICD-10-CM

## 2017-01-16 MED ORDER — ATORVASTATIN CALCIUM 40 MG PO TABS
40.0000 mg | ORAL_TABLET | Freq: Every day | ORAL | 3 refills | Status: DC
Start: 2017-01-16 — End: 2017-06-26

## 2017-01-16 NOTE — Progress Notes (Signed)
Cardiology Office Note   Date:  01/16/2017   ID:  [name], DOB 05-16-1966, MRN [id number]  Referring Doctor:  [name], MD   Cardiologist:   Wende Bushy, MD   Reason for consultation:  Chief Complaint  Patient presents with  . other    pre op for aortobifemoral bypass graft care of vascular surgery.       History of Present Illness: [name] is a 51 y.o. female who presents for Preoperative evaluation  Patient has history of significant PAD, diabetes with neuropathy, hyperlipidemia  Multiple medical issues including recent admission for sepsis from suspected diabetic foot ulcer. 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  Patient being prepared for bone biopsy by orthopedic surgery.  She is to vascular surgery patient and will require aortobifemoral bypass grafting. She is here for preoperative evaluation.  She was started on Midodrine (?) for chronic hypotension. However it is noted that she has an occluded left subclavian artery, as well as 80% stenosis in the right subclavian artery status post drug-eluting stent.  She has very limited functional capacity due to her chronic wounds and chronic medical illnesses. She has some shortness of breath with some exertion. No chest pains.   ROS:  Please see the history of present illness. Aside from mentioned under HPI, all other systems are reviewed and negative.     Past Medical History:  Diagnosis Date  . Arthritis   . Asthma   . Chronic back pain   . Depression   . Diabetes mellitus   . Diabetic neuropathy ([place])   . Family history of adverse reaction to anesthesia    mother had difficlty waking   . GERD (gastroesophageal reflux disease)   . Hyperlipidemia   . Joint pain   . Leg pain    With Walking  . PAD (peripheral artery disease) ([place])   . Reflux   . Ulcer ([place])    Foot    Past Surgical History:  Procedure Laterality Date  . ABDOMINAL AORTAGRAM  June 15, 2014  .  ABDOMINAL AORTAGRAM N/A 06/15/2014   Procedure: ABDOMINAL Maxcine Ham;  Surgeon: Serafina Mitchell, MD;  Location[facility] CATH LAB;  Service: Cardiovascular;  Laterality: N/A;  . ABDOMINAL AORTAGRAM N/A 11/22/2014   Procedure: ABDOMINAL AORTAGRAM;  Surgeon: Serafina Mitchell, MD;  Location: [facility] CATH LAB;  Service: Cardiovascular;  Laterality: N/A;  . ABDOMINAL AORTOGRAM W/LOWER EXTREMITY N/A 01/07/2017   Procedure: Abdominal Aortogram w/Lower Extremity;  Surgeon: Serafina Mitchell, MD;  Location: [facility] CV LAB;  Service: Cardiovascular;  Laterality: N/A;  . ARTERIAL BYPASS SURGRY  07/05/2010   Right Common Femoral to below knee popliteal BPG  . BACK SURGERY     X's  2  . CARDIAC CATHETERIZATION    . CHOLECYSTECTOMY     Gall Bladder  . CYSTECTOMY Right    foot  . CYSTECTOMY Left    wrist  . INTERCOSTAL NERVE BLOCK  November 2015  . IRRIGATION AND DEBRIDEMENT BUTTOCKS Right 09/30/2016   Procedure: DEBRIDEMENT RIGHT  BUTTOCK WOUND;  Surgeon: Georganna Skeans, MD;  Location: [facility];  Service: General;  Laterality: Right;  . left foot surgery    . left wrist cyst removal Left   . PERIPHERAL VASCULAR CATHETERIZATION N/A 05/07/2016   Procedure: Abdominal Aortogram;  Surgeon: Serafina Mitchell, MD;  Location: [place] CV LAB;  Service: Cardiovascular;  Laterality: N/A;  . PERIPHERAL VASCULAR CATHETERIZATION N/A 05/07/2016   Procedure: Lower  Extremity Angiography;  Surgeon: Serafina Mitchell, MD;  Location: [place] CV LAB;  Service: Cardiovascular;  Laterality: N/A;  . PERIPHERAL VASCULAR CATHETERIZATION N/A 05/07/2016   Procedure: Aortic Arch Angiography;  Surgeon: Serafina Mitchell, MD;  Location: [place] CV LAB;  Service: Cardiovascular;  Laterality: N/A;  . PERIPHERAL VASCULAR CATHETERIZATION N/A 05/07/2016   Procedure: Upper Extremity Angiography;  Surgeon: Serafina Mitchell, MD;  Location: [place] CV LAB;  Service: Cardiovascular;  Laterality: N/A;  . PERIPHERAL VASCULAR CATHETERIZATION Right 05/07/2016    Procedure: Peripheral Vascular Balloon Angioplasty;  Surgeon: Serafina Mitchell, MD;  Location: [place] CV LAB;  Service: Cardiovascular;  Laterality: Right;  subclavian  . PERIPHERAL VASCULAR CATHETERIZATION Right 05/07/2016   Procedure: Peripheral Vascular Intervention;  Surgeon: Serafina Mitchell, MD;  Location: [place] CV LAB;  Service: Cardiovascular;  Laterality: Right;  External  Iliac  . SKIN GRAFT Right 2012   [name] by Dr. Nils Pyle- Right and Left Ankle  . SPINE SURGERY    . TONSILLECTOMY       reports that she has been smoking Cigarettes.  She has a 45.00 pack-year smoking history. She has never used smokeless tobacco. She reports that she does not drink alcohol or use drugs.   family history includes Coronary artery disease in her mother; Diabetes in her brother, father, maternal grandmother, mother, paternal grandfather, paternal grandmother, and sister; Heart attack in her mother; Heart disease in her father and mother; Hyperlipidemia in her mother; Hypertension in her brother, mother, and sister; Other in her mother; Peripheral vascular disease in her mother; Varicose Veins in her mother.   Outpatient Medications Prior to Visit  Medication Sig Dispense Refill  . albuterol (PROVENTIL) 2 MG tablet Take 2 mg by mouth 3 (three) times daily as needed for shortness of breath.     Marland Kitchen aspirin 81 MG tablet Take 81 mg by mouth daily.     . cetirizine (ZYRTEC) 10 MG tablet Take 10 mg by mouth daily as needed for allergies. Reported on 03/27/2016    . cyclobenzaprine (FLEXERIL) 10 MG tablet Limit 1 tablet by mouth 1  to  3 times per day if tolerated (Patient taking differently: Take 10 mg by mouth 3 (three) times daily as needed for muscle spasms. ) 90 tablet 0  . dicyclomine (BENTYL) 10 MG capsule Take 10 mg by mouth 4 (four) times daily as needed for spasms.    . furosemide (LASIX) 40 MG tablet Take 40 mg by mouth 2 (two) times daily as needed for fluid.     Marland Kitchen HUMALOG KWIKPEN 100 UNIT/ML  KiwkPen Inject 10 Units into the skin 3 (three) times daily. Per sliding scale    . HYDROcodone-acetaminophen (NORCO) 10-325 MG tablet Limit 1 tablet by mouth per day or twice per day if tolerated (Patient taking differently: Take 1 tablet by mouth See admin instructions. Every 8-12 hours) 60 tablet 0  . LANTUS SOLOSTAR 100 UNIT/ML Solostar Pen Inject 36 Units into the skin 2 (two) times daily.     . meclizine (ANTIVERT) 25 MG tablet Take 25 mg by mouth 3 (three) times daily as needed for dizziness.    . midodrine (PROAMATINE) 10 MG tablet Take 1 tablet (10 mg total) by mouth 3 (three) times daily with meals. 90 tablet 0  . omeprazole (PRILOSEC) 40 MG capsule Take 40 mg by mouth daily.    . pregabalin (LYRICA) 100 MG capsule Limit 1 tab by mouth twice a day to 3 times  a day if tolerated (Patient taking differently: Take 100 mg by mouth 3 (three) times daily. ) 90 capsule 0  . pseudoephedrine-guaifenesin (MUCINEX D) 60-600 MG per tablet Take 1 tablet by mouth every 12 (twelve) hours as needed for congestion. Reported on 04/08/2016    . Choline Fenofibrate (FENOFIBRIC ACID) 45 MG CPDR Take 45 mg by mouth daily. Reported on 03/18/2016     Facility-Administered Medications Prior to Visit  Medication Dose Route Frequency Provider Last Rate Last Dose  . 0.9 %  sodium chloride infusion   Intravenous Continuous Serafina Mitchell, MD         Allergies: Penicillins    PHYSICAL EXAM: VS:  BP (!) 68/40 (BP Location: Right Arm, Patient Position: Sitting, Cuff Size: Normal) Comment: has stents  Pulse 88   Ht '5\' 7"'  (1.702 m)   Wt 140 lb 8 oz (63.7 kg)   BMI 22.01 kg/m  , Body mass index is 22.01 kg/m. Wt Readings from Last 3 Encounters:  01/16/17 140 lb 8 oz (63.7 kg)  01/07/17 144 lb (65.3 kg)  12/24/16 144 lb 10 oz (65.6 kg)    GENERAL:  well developed, well nourished, obese, not in acute distress HEENT: normocephalic, pink conjunctivae, anicteric sclerae, no xanthelasma, normal dentition, oropharynx  clear NECK:  no neck vein engorgement, JVP normal, no hepatojugular reflux, carotid upstroke brisk and symmetric, no bruit, no thyromegaly, no lymphadenopathy LUNGS:  good respiratory effort, clear to auscultation bilaterally CV:  PMI not displaced, no thrills, no lifts, S1 and S2 within normal limits, no palpable S3 or S4, no murmurs, no rubs, no gallops ABD:  Soft, nontender, nondistended, normoactive bowel sounds, no abdominal aortic bruit, no hepatomegaly, no splenomegaly MS: nontender back, no kyphosis, no scoliosis, no joint deformities EXT:  Poor DP/PT pulses, no edema, no varicosities, no cyanosis, no clubbing SKIN: warm, nondiaphoretic, normal turgor, no ulcers NEUROPSYCH: alert, oriented to person, place, and time, sensory/motor grossly intact, normal mood, appropriate affect  Recent Labs: 10/06/2016: Magnesium 1.5 12/23/2016: ALT 8 12/31/2016: Platelets 406 01/07/2017: BUN 27; Creatinine, Ser 0.70; Hemoglobin 13.9; Potassium 4.5; Sodium 135   Lipid Panel    Component Value Date/Time   TRIG 247 (H) 09/30/2016 2058     Other studies Reviewed:  EKG:  The ekg from 01/16/2017 was personally reviewed by me and it revealed sinus rhythm, 88 BPM. Occasional PVC.  Additional studies/ records that were reviewed personally reviewed by me today include:  Echo 10/03/2016: Left ventricle: The cavity size was normal. Wall thickness was   increased in a pattern of mild LVH. Systolic function was normal.   The estimated ejection fraction was in the range of 50% to 55%. - Mitral valve: Calcified annulus. Mildly thickened leaflets .  Impressions:  - Poor acoustic windows  Peripheral vascular catheterization 05/07/2016, Dr. Trula Slade: Impression:            #1  80% stenosis within the right subclavian artery just beyond the origin of the vertebral artery.  This was successfully treated using a drug coated balloon and plasty using a 5 x 40 Lutonix balloon.  Residual stenosis was less than  10%            #2  80% right external iliac stenosis initially treated with drug coated balloon angioplasty and a scoring balloon, both of which were 6 mm.  There was a residual dissection and therefore this was treated by stenting using a 7 x 60 ANOVA self-expanding stent with resolution of the stenosis            #  3  widely patent proximal bilateral femoral-popliteal bypass grafts            #4  occluded left subclavian artery  Abdominal aortogram 01/07/2017, Dr. Trula Slade:  Aortogram:  No renal artery stenosis.  The infrarenal abdominal aorta is small in caliber but patent without significant stenosis.  The right common and external iliac arteries are occluded.  The left common and external iliac arteries are patent but very small in caliber.             Right Lower Extremity:  The right common femoral and profunda femoral artery are patent throughout their course.  There is a stent within the proximal right femoral bypass graft which is widely patent.  The distal anastomosis is to the below-knee popliteal artery which is widely patent with three-vessel runoff             Left Lower Extremity:  The common femoral and profunda femoral artery are patent throughout their course.  There is a femoral to below-knee popliteal artery bypass graft which is widely patent.  There is three-vessel runoff  1  I did not feel that the patient was a good candidate for attempted recannulization, and therefore surgical revascularization will be considered             #2  I would favor an aorto bifemoral bypass graft, given the size of the left iliac artery             #3  patent bilateral femoral-popliteal bypass grafts.     ASSESSMENT AND PLAN: Significant peripheral vascular disease:  history of bilateral femoral-popliteal bypass graft, stenting or bypass graft external iliac stenting on the right  Right subclavian artery stenting Occluded left subclavian artery Carotid artery disease  She is being  evaluated preoperatively from the cardiac standpoint prior to planned aortobifemoral bypass graft  She has significant risks for CAD as mentioned above, to include diabetes, hypertension, hyperlipidemia She will require pharmacologic nuclear stress testing Continue medical therapy aspirin 81 mg by mouth daily. Would recommend statin therapy atorvastatin 40 mg by mouth daily at bedtime. May discontinue fenofibric at this point. We'll need fasting lipid panel and repeat in 3 months time. Ideal LDL goal < 70.   Chronic hypotension This is likely related to right and left subclavian artery disease, with the left subclavian artery occluded She is not symptomatic at all, upright, mentating well Would defer management to vascular surgery Unsure of benefit of midodrine if BP is not accurate  Tobacco use We discussed the importance of smoking cessation and different strategies for quitting.   Current medicines are reviewed at length with the patient today.  The patient does not have concerns regarding medicines.  Labs/ tests ordered today include:  Orders Placed This Encounter  Procedures  . NM Myocar Multi W/Spect W/Wall Motion / EF  . Lipid Profile  . Comp Met (CMET)    I had a lengthy and detailed discussion with the patient regarding diagnoses, prognosis, diagnostic options, treatment options , and side effects of medications.   I counseled the patient on importance of lifestyle modification including heart healthy diet, regular physical activity once cardiac workup completed , and smoking cessation.   Disposition:   FU with undersigned after tests   Thank you for this consultation. We will forwarding this consultation to referring physician.   I spent at least 60 minutes with the patient today and more than 50% of the time was spent counseling the patient and coordinating  care.    Signed, Wende Bushy, MD  01/16/2017 6:01 PM    [place]  This note was  generated in part with voice recognition software and I apologize for any typographical errors that were not detected and corrected.

## 2017-01-16 NOTE — Patient Instructions (Signed)
Medication Instructions:  Your physician has recommended you make the following change in your medication:  1. STOP Fenofibrate 2. START Atorvastatin 40 mg once daily   Labwork: Your physician recommends that you return for lab work in: early June go to [employer] to have labs done. Make sure not to eat or drink anything after midnight before coming in to have these done.    Testing/Procedures: [place]  Your caregiver has ordered a Stress Test with nuclear imaging. The purpose of this test is to evaluate the blood supply to your heart muscle. This procedure is referred to as a "Non-Invasive Stress Test." This is because other than having an IV started in your vein, nothing is inserted or "invades" your body. Cardiac stress tests are done to find areas of poor blood flow to the heart by determining the extent of coronary artery disease (CAD). Some patients exercise on a treadmill, which naturally increases the blood flow to your heart, while others who are  unable to walk on a treadmill due to physical limitations have a pharmacologic/chemical stress agent called Lexiscan . This medicine will mimic walking on a treadmill by temporarily increasing your coronary blood flow.   Please note: these test may take anywhere between 2-4 hours to complete  PLEASE REPORT TO [place] AT THE FIRST DESK WILL DIRECT YOU WHERE TO GO  Date of Procedure:_Friday January 24, 2017 at 08:00AM__  Arrival Time for Procedure:_Arrive at 07:45AM to register__  Instructions regarding medication:   _X__ : Hold diabetes medication and insulin the morning of procedure  _X___:  Take only 1/2 of your Lantus insulin the night before   PLEASE NOTIFY THE OFFICE AT LEAST 24 HOURS IN ADVANCE IF YOU ARE UNABLE TO KEEP YOUR APPOINTMENT.  [phone] AND  PLEASE NOTIFY NUCLEAR MEDICINE AT [facility] AT LEAST 24 HOURS IN ADVANCE IF YOU ARE UNABLE TO KEEP YOUR APPOINTMENT.  [phone]  How to prepare for your Myoview test:  1. Do not eat or drink after midnight 2. No caffeine for 24 hours prior to test 3. No smoking 24 hours prior to test. 4. Your medication may be taken with water.  If your doctor stopped a medication because of this test, do not take that medication. 5. Ladies, please do not wear dresses.  Skirts or pants are appropriate. Please wear a short sleeve shirt. 6. No perfume, cologne or lotion. 7. Wear comfortable walking shoes. No heels!   Follow-Up: Your physician wants you to follow-up in: 3 months. You will receive a reminder letter in the mail two months in advance. If you don't receive a letter, please call our office to schedule the follow-up appointment.  It was a pleasure seeing you today here in the office. Please do not hesitate to give [place] a call back if you have any further questions. [place], [name]

## 2017-01-17 ENCOUNTER — Encounter (INDEPENDENT_AMBULATORY_CARE_PROVIDER_SITE_OTHER): Payer: Self-pay | Admitting: Orthopedic Surgery

## 2017-01-17 ENCOUNTER — Ambulatory Visit (INDEPENDENT_AMBULATORY_CARE_PROVIDER_SITE_OTHER): Payer: Medicare Other | Admitting: Orthopedic Surgery

## 2017-01-17 VITALS — Ht 67.0 in | Wt 140.0 lb

## 2017-01-17 DIAGNOSIS — L97311 Non-pressure chronic ulcer of right ankle limited to breakdown of skin: Secondary | ICD-10-CM | POA: Insufficient documentation

## 2017-01-17 LAB — LIPID PANEL
CHOL/HDL RATIO: 6.7 ratio — AB (ref 0.0–4.4)
CHOLESTEROL TOTAL: 213 mg/dL — AB (ref 100–199)
HDL: 32 mg/dL — AB (ref >39)
LDL Calculated: 119 mg/dL — ABNORMAL HIGH (ref 0–99)
TRIGLYCERIDES: 310 mg/dL — AB (ref 0–149)
VLDL Cholesterol Cal: 62 mg/dL — ABNORMAL HIGH (ref 5–40)

## 2017-01-17 MED ORDER — DOXYCYCLINE HYCLATE 100 MG PO TABS: 100.0000 mg | ORAL_TABLET | Freq: Two times a day (BID) | ORAL | 0 refills | Status: DC

## 2017-01-17 NOTE — Progress Notes (Signed)
Office Visit Note   Patient: [name]           Date of Birth: May 07, 1966           MRN: [id number] Visit Date: 01/17/2017              Requested by: [name], MD [address] [place], [place] [address] PCP: [name], MD  Chief Complaint  Patient presents with  . Right Ankle - Open Wound    HPI:  Pt states she was in the hospital about 3 weeks ago for right ankle cellulitis and ulcer. There is a half dollar sized open are to the lateral ankle that is 100% fibrinous tissue. There is an silver dressing applied but it is heavily soiled and the pt states that this is changed several times a week. There is redness around the wound and swelling at the ankle.  There is no odor. She is full weight bearing without compression hose. Pamella Pert, RMA    Assessment & Plan: Visit Diagnoses:  1. Non-pressure chronic ulcer of right ankle limited to breakdown of skin ([place])     Plan: We'll start her on doxycycline she was on IV antibiotics in the hospital. She is currently undergoing silver alginate dressing changes Monday Wednesday Friday we will have her also to change this on the weekend. They are to wash this with soap and water with silver alginate dressing changes 4 times a week. Follow-up in 2 weeks with repeat evaluation with the MRI scan showing some edema in the bone and the chronic ulcer if were not showing any improvement we would need to consider surgical intervention. Discussed that this may require a transtibial amputation.  Follow-Up Instructions: Return in about 2 weeks (around 01/31/2017).   Ortho Exam Examination patient is alert oriented no adenopathy well-dressed normal affect normal respiratory effort she is ambulating in a wheelchair. She has a sacral decubitus ulcer currently has a wound VAC. She has been following Dr. Zada Girt for the treatment of the sacral and ankle ulcer. Examination there is fibrinous exudative tissue in covering the entire  ankle ulcer. There is no ascending cellulitis there is no exposed bone. After informed consent the ulcer was debrided of skin soft tissue and fibrinous exudate. The ulcers 2 cm in diameter and 1 mm deep. ROS: Complete review of systems negative other than as mentioned in the history of present illness Imaging: No results found.  Labs: Lab Results  Component Value Date   HGBA1C 10.8 (H) 12/31/2016   HGBA1C 11.2 (H) 12/24/2016   HGBA1C 13.1 (H) 06/27/2011   ESRSEDRATE 65 (H) 12/31/2016   ESRSEDRATE 78 (H) 12/24/2016   CRP 1.0 (H) 12/31/2016   CRP 5.4 (H) 12/24/2016   REPTSTATUS 12/25/2016 FINAL 12/23/2016   GRAMSTAIN  09/30/2016    ABUNDANT WBC PRESENT,BOTH PMN AND MONONUCLEAR ABUNDANT GRAM NEGATIVE RODS ABUNDANT GRAM POSITIVE COCCI IN PAIRS IN CLUSTERS FEW GRAM POSITIVE RODS    CULT MULTIPLE SPECIES PRESENT, SUGGEST RECOLLECTION (A) 12/23/2016    Orders:  No orders of the defined types were placed in this encounter.  No orders of the defined types were placed in this encounter.    Procedures: No procedures performed  Clinical Data: No additional findings.  Subjective: Review of Systems  Objective: Vital Signs: Ht 5\' 7"  (1.702 m)   Wt 140 lb (63.5 kg)   BMI 21.93 kg/m   Specialty Comments:  No specialty comments available.  PMFS History: Patient Active  Problem List   Diagnosis Date Noted  . Non-pressure chronic ulcer of right ankle limited to breakdown of skin ([place]) 01/17/2017  . Elevated lactic acid level   . 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 Chronic pain 12/24/2016  . Buttock wound, left, subsequent encounter   . Hypotension 12/23/2016  . IDDM (insulin dependent diabetes mellitus) ([place]) 12/23/2016  . AKI (acute kidney injury) ([place]) 12/23/2016  . Malnutrition ([place]) 12/23/2016  . Thrush 12/23/2016  . Sepsis ([place]) 11/27/2016  . DM2 (diabetes mellitus, type 2) ([place]) 11/27/2016  . Chronic back pain 11/27/2016  . Orthostatic hypotension 11/27/2016    . Demand ischemia ([place])   . Other hyperlipidemia   . Right sided weakness   . Retinal tumor of right eye   . Pressure injury of skin 10/01/2016  . Necrotizing fasciitis ([place]) 09/30/2016  . Bilateral subclavian artery occlusion ([place]) 03/05/2016  . Paresthesia of both hands 03/05/2016  . DDD (degenerative disc disease), lumbosacral 03/26/2015  . DDD (degenerative disc disease), cervical 03/26/2015  . Sacroiliac joint disease 03/26/2015  . Occipital neuralgia 03/26/2015  . Pain in limb-Bilateral Leg 04/25/2014  . PAD (peripheral artery disease) ([place]) 04/05/2013  . Chronic total occlusion of artery of the extremities ([place]) 02/10/2012   Past Medical History:  Diagnosis Date  . Arthritis   . Asthma   . Chronic back pain   . Depression   . Diabetes mellitus   . Diabetic neuropathy ([place])   . Family history of adverse reaction to anesthesia    mother had difficlty waking   . GERD (gastroesophageal reflux disease)   . Hyperlipidemia   . Joint pain   . Leg pain    With Walking  . PAD (peripheral artery disease) ([place])   . Reflux   . Ulcer ([place])    Foot    Family History  Problem Relation Age of Onset  . Coronary artery disease Mother   . Peripheral vascular disease Mother   . Heart disease Mother     Before age 66  . Other Mother     Venous insuffiency  . Diabetes Mother   . Hyperlipidemia Mother   . Hypertension Mother   . Varicose Veins Mother   . Heart attack Mother     before age 29  . Heart disease Father   . Diabetes Father   . Diabetes Maternal Grandmother   . Diabetes Paternal Grandmother   . Diabetes Paternal Grandfather   . Diabetes Sister   . Hypertension Sister   . Diabetes Brother   . Hypertension Brother     Past Surgical History:  Procedure Laterality Date  . ABDOMINAL AORTAGRAM  June 15, 2014  . ABDOMINAL AORTAGRAM N/A 06/15/2014   Procedure: ABDOMINAL Maxcine Ham;  Surgeon: Serafina Mitchell, MD;  Location: [facility] CATH LAB;  Service: Cardiovascular;   Laterality: N/A;  . ABDOMINAL AORTAGRAM N/A 11/22/2014   Procedure: ABDOMINAL AORTAGRAM;  Surgeon: Serafina Mitchell, MD;  Location: [facility] CATH LAB;  Service: Cardiovascular;  Laterality: N/A;  . ABDOMINAL AORTOGRAM W/LOWER EXTREMITY N/A 01/07/2017   Procedure: Abdominal Aortogram w/Lower Extremity;  Surgeon: Serafina Mitchell, MD;  Location: [place] CV LAB;  Service: Cardiovascular;  Laterality: N/A;  . ARTERIAL BYPASS SURGRY  07/05/2010   Right Common Femoral to below knee popliteal BPG  . BACK SURGERY     X's  2  . CARDIAC CATHETERIZATION    . CHOLECYSTECTOMY     Gall Bladder  . CYSTECTOMY Right    foot  .  CYSTECTOMY Left    wrist  . INTERCOSTAL NERVE BLOCK  November 2015  . IRRIGATION AND DEBRIDEMENT BUTTOCKS Right 09/30/2016   Procedure: DEBRIDEMENT RIGHT  BUTTOCK WOUND;  Surgeon: Georganna Skeans, MD;  Location: [place];  Service: General;  Laterality: Right;  . left foot surgery    . left wrist cyst removal Left   . PERIPHERAL VASCULAR CATHETERIZATION N/A 05/07/2016   Procedure: Abdominal Aortogram;  Surgeon: Serafina Mitchell, MD;  Location: [place] CV LAB;  Service: Cardiovascular;  Laterality: N/A;  . PERIPHERAL VASCULAR CATHETERIZATION N/A 05/07/2016   Procedure: Lower Extremity Angiography;  Surgeon: Serafina Mitchell, MD;  Location: [place] [facility] CV LAB;  Service: Cardiovascular;  Laterality: N/A;  . PERIPHERAL VASCULAR CATHETERIZATION N/A 05/07/2016   Procedure: Aortic Arch Angiography;  Surgeon: Serafina Mitchell, MD;  Location: [place] CV LAB;  Service: Cardiovascular;  Laterality: N/A;  . PERIPHERAL VASCULAR CATHETERIZATION N/A 05/07/2016   Procedure: Upper Extremity Angiography;  Surgeon: Serafina Mitchell, MD;  Location: [place] CV LAB;  Service: Cardiovascular;  Laterality: N/A;  . PERIPHERAL VASCULAR CATHETERIZATION Right 05/07/2016   Procedure: Peripheral Vascular Balloon Angioplasty;  Surgeon: Serafina Mitchell, MD;  Location: [place] CV LAB;  Service: Cardiovascular;   Laterality: Right;  subclavian  . PERIPHERAL VASCULAR CATHETERIZATION Right 05/07/2016   Procedure: Peripheral Vascular Intervention;  Surgeon: Serafina Mitchell, MD;  Location: [place] CV LAB;  Service: Cardiovascular;  Laterality: Right;  External  Iliac  . SKIN GRAFT Right 2012   [name] by Dr. Nils Pyle- Right and Left Ankle  . SPINE SURGERY    . TONSILLECTOMY     Social History   Occupational History  . disabled    Social History Main Topics  . Smoking status: Current Every Day Smoker    Packs/day: 1.50    Years: 30.00    Types: Cigarettes  . Smokeless tobacco: Never Used     Comment: 2 cigarettes a day  . Alcohol use No  . Drug use: No  . Sexual activity: Yes    Birth control/ protection: Post-menopausal

## 2017-01-20 ENCOUNTER — Encounter: Payer: Medicare Other | Admitting: Surgery

## 2017-01-20 NOTE — Addendum Note (Signed)
Addended by: Anselm Pancoast on: 01/20/2017 09:55 AM   Modules accepted: Orders

## 2017-01-21 ENCOUNTER — Ambulatory Visit: Payer: Medicare Other | Admitting: Internal Medicine

## 2017-01-23 ENCOUNTER — Ambulatory Visit: Payer: Medicare Other | Admitting: Cardiovascular Disease

## 2017-01-23 ENCOUNTER — Other Ambulatory Visit: Payer: Self-pay | Admitting: Surgery

## 2017-01-23 ENCOUNTER — Ambulatory Visit
Admission: RE | Admit: 2017-01-23 | Discharge: 2017-01-23 | Disposition: A | Discharge status: Home / Self Care | Payer: Medicare Other | Source: Ambulatory Visit | Attending: Surgery | Admitting: Surgery

## 2017-01-23 ENCOUNTER — Encounter: Payer: Medicare Other | Attending: Internal Medicine | Admitting: Surgery

## 2017-01-23 DIAGNOSIS — S81809A Unspecified open wound, unspecified lower leg, initial encounter: Secondary | ICD-10-CM | POA: Diagnosis present

## 2017-01-23 DIAGNOSIS — I2510 Atherosclerotic heart disease of native coronary artery without angina pectoris: Secondary | ICD-10-CM | POA: Diagnosis not present

## 2017-01-23 DIAGNOSIS — L97312 Non-pressure chronic ulcer of right ankle with fat layer exposed: Secondary | ICD-10-CM | POA: Diagnosis not present

## 2017-01-23 DIAGNOSIS — Z880 Allergy status to penicillin: Secondary | ICD-10-CM | POA: Insufficient documentation

## 2017-01-23 DIAGNOSIS — F17218 Nicotine dependence, cigarettes, with other nicotine-induced disorders: Secondary | ICD-10-CM | POA: Diagnosis not present

## 2017-01-23 DIAGNOSIS — X58XXXA Exposure to other specified factors, initial encounter: Secondary | ICD-10-CM | POA: Diagnosis not present

## 2017-01-23 DIAGNOSIS — E1151 Type 2 diabetes mellitus with diabetic peripheral angiopathy without gangrene: Secondary | ICD-10-CM | POA: Diagnosis not present

## 2017-01-23 DIAGNOSIS — M1990 Unspecified osteoarthritis, unspecified site: Secondary | ICD-10-CM | POA: Diagnosis not present

## 2017-01-23 DIAGNOSIS — I70233 Atherosclerosis of native arteries of right leg with ulceration of ankle: Secondary | ICD-10-CM | POA: Insufficient documentation

## 2017-01-23 DIAGNOSIS — D649 Anemia, unspecified: Secondary | ICD-10-CM | POA: Diagnosis not present

## 2017-01-23 DIAGNOSIS — L89314 Pressure ulcer of right buttock, stage 4: Secondary | ICD-10-CM | POA: Diagnosis not present

## 2017-01-23 DIAGNOSIS — E11621 Type 2 diabetes mellitus with foot ulcer: Secondary | ICD-10-CM | POA: Insufficient documentation

## 2017-01-23 DIAGNOSIS — M86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, right ankle and foot: Secondary | ICD-10-CM | POA: Insufficient documentation

## 2017-01-23 NOTE — Progress Notes (Addendum)
LAVAUGHN, BISIG ([id number]) Visit Report for 01/23/2017 Allergy List Details Patient Name: [name], [name]. Date of Service: 01/23/2017 9:00 AM Medical Record Number: [id number] Patient Account Number: [id number] Date of Birth/Sex: 07-May-1966 (51 y.o. Female) Treating RN: Baruch Gouty, RN, [name], Velva Harman Primary Care Nylia Gavina: Delight Stare Other Clinician: Referring Asberry Lascola: Beverlyn Roux Treating Robbert Langlinais/Extender: Frann Rider in Treatment: 0 Allergies Active Allergies penicillin Allergy Notes Electronic Signature(s) Signed: 01/23/2017 8:35:29 AM By: Regan Lemming [name], RN Entered By: Regan Lemming on 01/23/2017 08:35:29 Worm, [name] ([id number]) -------------------------------------------------------------------------------- Arrival Information Details Patient Name: [name], [name]. Date of Service: 01/23/2017 9:00 AM Medical Record Number: [id number] Patient Account Number: [id number] Date of Birth/Sex: 1966/06/16 (51 y.o. Female) Treating RN: Afful, RN, [name], Velva Harman Primary Care Kyriaki Moder: Delight Stare Other Clinician: Referring Daziah Hesler: Beverlyn Roux Treating Muskan Bolla/Extender: Frann Rider in Treatment: 0 Visit Information Patient Arrived: Charlyn Minerva Time: 09:17 Accompanied By: husband Transfer Assistance: None Patient Identification Verified: Yes Secondary Verification Process Yes Completed: Patient Requires Transmission-Based No Precautions: Patient Has Alerts: No Electronic Signature(s) Signed: 01/23/2017 4:20:48 PM By: Regan Lemming [name], RN Entered By: Regan Lemming on 01/23/2017 09:18:22 Foskey, [name] ([id number]) -------------------------------------------------------------------------------- Clinic Level of Care Assessment Details Patient Name: Jamie Martyr. Date of Service: 01/23/2017 9:00 AM Medical Record Number: [id number] Patient Account Number: [id number] Date of Birth/Sex: [birth year] (51 y.o. Female) Treating RN: Afful, RN, [name], Velva Harman Primary Care Lunabella Badgett:  Delight Stare Other Clinician: Referring Kemari Mares: Beverlyn Roux Treating Sammy Cassar/Extender: Frann Rider in Treatment: 0 Clinic Level of Care Assessment Items TOOL 1 Quantity Score []  - Use when EandM and Procedure is performed on INITIAL visit 0 ASSESSMENTS - Nursing Assessment / Reassessment X - General Physical Exam (combine w/ comprehensive assessment (listed just 1 20 below) when performed on new pt. evals) X - Comprehensive Assessment (HX, ROS, Risk Assessments, Wounds Hx, etc.) 1 25 ASSESSMENTS - Wound and Skin Assessment / Reassessment []  - Dermatologic / Skin Assessment (not related to wound area) 0 ASSESSMENTS - Ostomy and/or Continence Assessment and Care []  - Incontinence Assessment and Management 0 []  - Ostomy Care Assessment and Management (repouching, etc.) 0 PROCESS - Coordination of Care X - Simple Patient / Family Education for ongoing care 1 15 []  - Complex (extensive) Patient / Family Education for ongoing care 0 X - Staff obtains Programmer, systems, Records, Test Results / Process Orders 1 10 X - Staff telephones HHA, Nursing Homes / Clarify orders / etc 1 10 []  - Routine Transfer to another Facility (non-emergent condition) 0 []  - Routine Hospital Admission (non-emergent condition) 0 X - New Admissions / Biomedical engineer / Ordering NPWT, Apligraf, etc. 1 15 []  - Emergency Hospital Admission (emergent condition) 0 PROCESS - Special Needs []  - Pediatric / Minor Patient Management 0 []  - Isolation Patient Management 0 Garson, Tyreona S. ([id number]) []  - Hearing / Language / Visual special needs 0 []  - Assessment of Community assistance (transportation, D/C planning, etc.) 0 []  - Additional assistance / Altered mentation 0 []  - Support Surface(s) Assessment (bed, cushion, seat, etc.) 0 INTERVENTIONS - Miscellaneous []  - External ear exam 0 []  - Patient Transfer (multiple staff / Civil Service fast streamer / Similar devices) 0 []  - Simple Staple / Suture removal (25 or less)  0 []  - Complex Staple / Suture removal (26 or more) 0 []  - Hypo/Hyperglycemic Management (do not check if billed separately) 0 X - Ankle / Brachial Index (ABI) - do not check if billed separately 1 15 Has the patient been seen at  the hospital within the last three years: Yes Total Score: 110 Level Of Care: New/Established - Level 3 Electronic Signature(s) Signed: 01/23/2017 4:20:48 PM By: Regan Lemming [name], RN Entered By: Regan Lemming on 01/23/2017 11:00:18 Ord, [name] ([id number]) -------------------------------------------------------------------------------- Encounter Discharge Information Details Patient Name: [name], [name]. Date of Service: 01/23/2017 9:00 AM Medical Record Number: [id number] Patient Account Number: [id number] Date of Birth/Sex: 01-06-1966 (51 y.o. Female) Treating RN: Afful, RN, [name], Velva Harman Primary Care Kennesha Brewbaker: Delight Stare Other Clinician: Referring Aino Heckert: Beverlyn Roux Treating Amen Staszak/Extender: Frann Rider in Treatment: 0 Encounter Discharge Information Items Discharge Pain Level: 0 Discharge Condition: Stable Ambulatory Status: Walker Discharge Destination: Home Transportation: Private Auto Accompanied By: husband Schedule Follow-up Appointment: No Medication Reconciliation completed No and provided to Patient/Care Luiz Trumpower: Provided on Clinical Summary of Care: 01/23/2017 Form Type Recipient Paper Patient RM Electronic Signature(s) Signed: 01/23/2017 11:01:42 AM By: Regan Lemming [name], RN Previous Signature: 01/23/2017 10:52:01 AM Version By: Ruthine Dose Entered By: Regan Lemming on 01/23/2017 11:01:41 Wessinger, [place]. ([id number]) -------------------------------------------------------------------------------- Lower Extremity Assessment Details Patient Name: Jamie Martyr. Date of Service: 01/23/2017 9:00 AM Medical Record Number: [id number] Patient Account Number: [id number] Date of Birth/Sex: 07/16/1966 (51 y.o. Female) Treating RN: Afful,  RN, [name], [employer] Primary Care Savita Runner: Delight Stare Other Clinician: Referring Seylah Wernert: Beverlyn Roux Treating Sufyaan Palma/Extender: Frann Rider in Treatment: 0 Edema Assessment Assessed: [Left: No] [Right: No] E[Left: dema] [Right: :] Calf Left: Right: Point of Measurement: 37 cm From Medial Instep 30 cm 31 cm Ankle Left: Right: Point of Measurement: 9 cm From Medial Instep 19 cm 19 cm Vascular Assessment Pulses: Dorsalis Pedis Palpable: [Left:Yes] [Right:Yes] Posterior Tibial Popliteal Palpable: [Left:Yes] [Right:No] Extremity colors, hair growth, and conditions: Extremity Color: [Left:Normal] [Right:Normal] Hair Growth on Extremity: [Left:No] [Right:No] Temperature of Extremity: [Left:Warm] [Right:Warm] Capillary Refill: [Left:< 3 seconds] [Right:< 3 seconds] Blood Pressure: Brachial: [Left:55] Dorsalis Pedis: 85 [Left:Dorsalis Pedis: 55] Ankle: Posterior Tibial: [Left:Posterior Tibial: 1.55] [Right:1.00] Toe Nail Assessment Left: Right: Thick: Yes Yes Discolored: Yes Yes Deformed: Yes Yes Improper Length and Hygiene: Yes Yes KHALILA, BUECHNER ([id number]) Electronic Signature(s) Signed: 01/23/2017 4:20:48 PM By: Regan Lemming [name], RN Entered By: Regan Lemming on 01/23/2017 10:07:22 Carelli, [name] ([id number]) -------------------------------------------------------------------------------- Multi Wound Chart Details Patient Name: Jamie Martyr. Date of Service: 01/23/2017 9:00 AM Medical Record Number: [id number] Patient Account Number: [id number] Date of Birth/Sex: [birth year] (51 y.o. Female) Treating RN: Baruch Gouty, RN, [name], Velva Harman Primary Care Tamaya Pun: Delight Stare Other Clinician: Referring Tanina Barb: Beverlyn Roux Treating Damyen Knoll/Extender: Frann Rider in Treatment: 0 Vital Signs Height(in): 67 Pulse(bpm): 100 Weight(lbs): 137 Blood Pressure 56/44 (mmHg): Body Mass Index(BMI): 21 Temperature(F): 97.8 Respiratory Rate 18 (breaths/min): Photos:  [1:No Photos] [2:No Photos] [N/A:N/A] Wound Location: [1:Right Malleolus - Lateral] [2:Right Gluteus] [N/A:N/A] Wounding Event: [1:Gradually Appeared] [2:Gradually Appeared] [N/A:N/A] Primary Etiology: [1:Diabetic Wound/Ulcer of the Lower Extremity] [2:Open Surgical Wound] [N/A:N/A] Comorbid History: [1:Anemia, Arrhythmia, Coronary Artery Disease, Hypotension, Peripheral Arterial Disease, Type II Diabetes, Osteoarthritis] [2:Anemia, Arrhythmia, Coronary Artery Disease, Hypotension, Type II] [N/A:N/A] Date Acquired: [1:09/23/2016] [2:09/18/2016] [N/A:N/A] Weeks of Treatment: [1:0] [2:0] [N/A:N/A] Wound Status: [1:Open] [2:Open] [N/A:N/A] Measurements L x W x D 2x2.5x0.5 [2:7.2x3.4x5] [N/A:N/A] (cm) Area (cm) : [1:3.927] [2:19.227] [N/A:N/A] Volume (cm) : [1:1.963] [2:96.133] [N/A:N/A] % Reduction in Area: [1:0.00%] [2:N/A] [N/A:N/A] % Reduction in Volume: 0.00% [2:N/A] [N/A:N/A] Position 1 (o'clock): [2:6] Maximum Distance 1 [2:5] (cm): Starting Position 1 [1:6] (o'clock): Ending Position 1 [1:12] (o'clock): Maximum Distance 1 2 (cm): Tunneling: [1:No] [2:Yes] [N/A:N/A] Undermining: [1:Yes] [2:N/A] [  N/A:N/A] Classification: Grade 2 Full Thickness Without N/A Exposed Support Structures Exudate Amount: Medium Large N/A Exudate Type: Serosanguineous Serosanguineous N/A Exudate Color: red, brown red, brown N/A Wound Margin: Flat and Intact Distinct, outline attached N/A Granulation Amount: Small (1-33%) Small (1-33%) N/A Granulation Quality: Pink Pink, Pale N/A Necrotic Amount: Large (67-100%) Large (67-100%) N/A Exposed Structures: Fascia: No Fat Layer (Subcutaneous N/A Fat Layer (Subcutaneous Tissue) Exposed: Yes Tissue) Exposed: No Fascia: No Tendon: No Tendon: No Muscle: No Muscle: No Joint: No Joint: No Bone: No Bone: No Limited to Skin Breakdown Epithelialization: None N/A N/A Debridement: N/A Debridement ([id number]) Pre-procedure N/A 10:20  N/A Verification/Time Out Taken: Pain Control: N/A Lidocaine 4% Topical N/A Solution Tissue Debrided: N/A Fibrin/Slough, N/A Subcutaneous Level: N/A Skin/Subcutaneous N/A Tissue Debridement Area (sq N/A 4 N/A cm): Instrument: N/A Forceps, Scissors N/A Bleeding: N/A Minimum N/A Hemostasis Achieved: N/A Pressure N/A Procedural Pain: N/A 3 N/A Post Procedural Pain: N/A 3 N/A Debridement Treatment N/A Procedure was tolerated N/A Response: well Post Debridement N/A 7.2x3.4x5 N/A Measurements L x W x D (cm) Post Debridement N/A 96.133 N/A Volume: (cm) Periwound Skin Texture: Excoriation: No Excoriation: No N/A Induration: No Induration: No Callus: No Callus: No Crepitus: No Crepitus: No Hunley, [name] ([id number]) Rash: No Rash: No Scarring: No Scarring: No Periwound Skin Maceration: No Maceration: No N/A Moisture: Dry/Scaly: No Dry/Scaly: No Periwound Skin Color: Atrophie Blanche: No Atrophie Blanche: No N/A Cyanosis: No Cyanosis: No Ecchymosis: No Ecchymosis: No Erythema: No Erythema: No Hemosiderin Staining: No Hemosiderin Staining: No Mottled: No Mottled: No Pallor: No Pallor: No Rubor: No Rubor: No Temperature: No Abnormality No Abnormality N/A Tenderness on Yes No N/A Palpation: Wound Preparation: Ulcer Cleansing: Ulcer Cleansing: N/A Rinsed/Irrigated with Rinsed/Irrigated with Saline Saline Topical Anesthetic Topical Anesthetic Applied: Other: Lidocaine Applied: Other: lidocaine 4% 4% Procedures Performed: N/A Debridement N/A Treatment Notes Electronic Signature(s) Signed: 01/23/2017 10:54:42 AM By: Christin Fudge MD, FACS Entered By: Christin Fudge on 01/23/2017 10:54:42 [name], [name] ([id number]) -------------------------------------------------------------------------------- [facility] Details Patient Name: [name], [name]. Date of Service: 01/23/2017 9:00 AM Medical Record Number: [id number] Patient Account Number:  [id number] Date of Birth/Sex: October 18, 1966 (51 y.o. Female) Treating RN: Afful, RN, [name], [employer] Primary Care Ihor Meinzer: Delight Stare Other Clinician: Referring Brylin Stopper: Beverlyn Roux Treating Artyom Stencel/Extender: Frann Rider in Treatment: 0 Active Inactive ` Orientation to the Wound Care Program Nursing Diagnoses: Knowledge deficit related to the wound healing center program Goals: Patient/caregiver will verbalize understanding of the [facility] Program Date Initiated: 01/23/2017 Target Resolution Date: 05/25/2017 Goal Status: Active Interventions: Provide education on orientation to the wound center Notes: ` Peripheral Neuropathy Nursing Diagnoses: Knowledge deficit related to disease process and management of peripheral neurovascular dysfunction Potential alteration in peripheral tissue perfusion (select prior to confirmation of diagnosis) Goals: Patient/caregiver will verbalize understanding of disease process and disease management Date Initiated: 01/23/2017 Target Resolution Date: 05/25/2017 Goal Status: Active Interventions: Assess signs and symptoms of neuropathy upon admission and as needed Provide education on Management of Neuropathy and Related Ulcers Provide education on Management of Neuropathy upon discharge from the [facility] Activities: Test ordered outside of clinic : 01/23/2017 Notes: [name], [name] ([id number]) ` Pressure Nursing Diagnoses: Knowledge deficit related to causes and risk factors for pressure ulcer development Knowledge deficit related to management of pressures ulcers Potential for impaired tissue integrity related to pressure, friction, moisture, and shear Goals: Patient will remain free from development of additional pressure ulcers Date Initiated: 01/23/2017 Target Resolution  Date: 05/25/2017 Goal Status: Active Patient will remain free of pressure ulcers Date Initiated: 01/23/2017 Target Resolution Date: 05/25/2017 Goal  Status: Active Patient/caregiver will verbalize risk factors for pressure ulcer development Date Initiated: 01/23/2017 Target Resolution Date: 05/25/2017 Goal Status: Active Patient/caregiver will verbalize understanding of pressure ulcer management Date Initiated: 01/23/2017 Target Resolution Date: 05/25/2017 Goal Status: Active Interventions: Assess: immobility, friction, shearing, incontinence upon admission and as needed Assess offloading mechanisms upon admission and as needed Assess potential for pressure ulcer upon admission and as needed Provide education on pressure ulcers Treatment Activities: Patient referred for pressure reduction/relief devices : 01/23/2017 Patient referred for seating evaluation to ensure proper offloading : 01/23/2017 Pressure reduction/relief device ordered : 01/23/2017 Notes: ` Wound/Skin Impairment Nursing Diagnoses: Impaired tissue integrity Knowledge deficit related to smoking impact on wound healing Knowledge deficit related to ulceration/compromised skin integrity Goals: Patient/caregiver will verbalize understanding of skin care regimen [name], [name] ([id number]) Date Initiated: 01/23/2017 Target Resolution Date: 05/25/2017 Goal Status: Active Ulcer/skin breakdown will have a volume reduction of 30% by week 4 Date Initiated: 01/23/2017 Target Resolution Date: 05/25/2017 Goal Status: Active Ulcer/skin breakdown will have a volume reduction of 50% by week 8 Date Initiated: 01/23/2017 Target Resolution Date: 05/25/2017 Goal Status: Active Ulcer/skin breakdown will have a volume reduction of 80% by week 12 Date Initiated: 01/23/2017 Target Resolution Date: 05/25/2017 Goal Status: Active Ulcer/skin breakdown will heal within 14 weeks Date Initiated: 01/23/2017 Target Resolution Date: 05/25/2017 Goal Status: Active Interventions: Assess patient/caregiver ability to obtain necessary supplies Assess patient/caregiver ability to perform ulcer/skin care regimen upon  admission and as needed Assess ulceration(s) every visit Provide education on smoking Provide education on ulcer and skin care Treatment Activities: Patient referred to home care : 01/23/2017 Referred to DME Marwin Primmer for dressing supplies : 01/23/2017 Skin care regimen initiated : 01/23/2017 Topical wound management initiated : 01/23/2017 Notes: Electronic Signature(s) Signed: 01/23/2017 4:20:48 PM By: Regan Lemming [name], RN Entered By: Regan Lemming on 01/23/2017 10:23:02 Noland, [name] ([id number]) -------------------------------------------------------------------------------- Pain Assessment Details Patient Name: Jamie Martyr. Date of Service: 01/23/2017 9:00 AM Medical Record Number: [id number] Patient Account Number: [id number] Date of Birth/Sex: [birth year] (51 y.o. Female) Treating RN: Afful, RN, [name], Velva Harman Primary Care Evan Osburn: Delight Stare Other Clinician: Referring Quanisha Drewry: Beverlyn Roux Treating Keyra Virella/Extender: Frann Rider in Treatment: 0 Active Problems Location of Pain Severity and Description of Pain Patient Has Paino Yes Site Locations Rate the pain. Current Pain Level: 10 Worst Pain Level: 10 Character of Pain Describe the Pain: Aching, Tender, Throbbing Pain Management and Medication Current Pain Management: How does your wound impact your activities of daily livingo Sleep: Yes Bathing: Yes Appetite: Yes Relationship With Others: Yes Bladder Continence: Yes Emotions: Yes Bowel Continence: Yes Work: Yes Toileting: Yes Drive: Yes Dressing: Yes Hobbies: Yes Engineer, maintenance) Signed: 01/23/2017 4:20:48 PM By: Regan Lemming [name], RN Entered By: Regan Lemming on 01/23/2017 09:19:18 Klimowicz, [name] ([id number]) -------------------------------------------------------------------------------- Patient/Caregiver Education Details Patient Name: Jamie Martyr. Date of Service: 01/23/2017 9:00 AM Medical Record Number: [id number] Patient Account Number:  [id number] Date of Birth/Gender: [birth year] (51 y.o. Female) Treating RN: Afful, RN, [name], Velva Harman Primary Care Physician: Delight Stare Other Clinician: Referring Physician: Beverlyn Roux Treating Physician/Extender: Frann Rider in Treatment: 0 Education Assessment Education Provided To: Patient Education Topics Provided Peripheral Neuropathy: Methods: Explain/Verbal Responses: State content correctly Pressure: Methods: Explain/Verbal Responses: State content correctly Smoking and Wound Healing: Methods: Explain/Verbal Responses: State content correctly Welcome To [facility]: Methods: Explain/Verbal Wound/Skin Impairment:  Methods: Explain/Verbal Responses: State content correctly Electronic Signature(s) Signed: 01/23/2017 4:20:48 PM By: Regan Lemming [name], RN Entered By: Regan Lemming on 01/23/2017 11:02:27 Dills, [name] ([id number]) -------------------------------------------------------------------------------- Wound Assessment Details Patient Name: Jamie Martyr. Date of Service: 01/23/2017 9:00 AM Medical Record Number: [id number] Patient Account Number: [id number] Date of Birth/Sex: 07-05-1966 (51 y.o. Female) Treating RN: Afful, RN, [name], [employer] Primary Care Kazuto Sevey: Delight Stare Other Clinician: Referring Leonel Mccollum: Beverlyn Roux Treating Shahad Mazurek/Extender: Frann Rider in Treatment: 0 Wound Status Wound Number: 1 Primary Diabetic Wound/Ulcer of the Lower Etiology: Extremity Wound Location: Right Malleolus - Lateral Wound Open Wounding Event: Gradually Appeared Status: Date Acquired: 09/23/2016 Comorbid Anemia, Arrhythmia, Coronary Artery Weeks Of Treatment: 0 History: Disease, Hypotension, Peripheral Clustered Wound: No Arterial Disease, Type II Diabetes, Osteoarthritis Photos Photo Uploaded By: Regan Lemming on 01/23/2017 16:27:37 Wound Measurements Length: (cm) 2 Width: (cm) 2.5 Depth: (cm) 0.5 Area: (cm) 3.927 Volume: (cm)  1.963 Cremer, [name] ([id number]) % Reduction in Area: 0% % Reduction in Volume: 0% Epithelialization: None Tunneling: No Undermining: Yes Starting Position (o'clock): 6 Ending Position (o'clock): 12 Maximum Distance: (cm) 2 Wound Description Classification: Grade 2 Wound Margin: Flat and Intact Exudate Amount: Medium Exudate Type: Serosanguineous Exudate Color: red, brown Foul Odor After Cleansing: No Slough/Fibrino Yes Wound Bed Granulation Amount: Small (1-33%) Exposed Structure Granulation Quality: Pink Fascia Exposed: No Necrotic Amount: Large (67-100%) Fat Layer (Subcutaneous Tissue) Exposed: No Necrotic Quality: Adherent Slough Tendon Exposed: No Muscle Exposed: No Joint Exposed: No Bone Exposed: No Limited to Skin Breakdown Periwound Skin Texture Texture Color No Abnormalities Noted: No No Abnormalities Noted: No Callus: No Atrophie Blanche: No Crepitus: No Cyanosis: No Excoriation: No Ecchymosis: No Induration: No Erythema: No Rash: No Hemosiderin Staining: No Scarring: No Mottled: No Pallor: No Moisture Rubor: No No Abnormalities Noted: No Dry / Scaly: No Temperature / Pain Maceration: No Temperature: No Abnormality Tenderness on Palpation: Yes Wound Preparation Ulcer Cleansing: Rinsed/Irrigated with Saline Topical Anesthetic Applied: Other: Lidocaine 4%, Treatment Notes Wound #1 (Right, Lateral Malleolus) 1. Cleansed with: Clean wound with Normal Saline 4. Dressing Applied: Santyl Ointment 5. Secondary Dressing Applied Bordered Foam Dressing Dry Gauze 7. Secured with SALSABEEL, GORELICK ([id number]) Tape Electronic Signature(s) Signed: 01/23/2017 4:20:48 PM By: Regan Lemming [name], RN Entered By: Regan Lemming on 01/23/2017 10:22:47 Burdell, [name] ([id number]) -------------------------------------------------------------------------------- Wound Assessment Details Patient Name: [name], FELDT S. Date of Service: 01/23/2017 9:00 AM Medical  Record Number: [id number] Patient Account Number: [id number] Date of Birth/Sex: 27-Jun-1966 (51 y.o. Female) Treating RN: Afful, RN, [name], [employer] Primary Care Yusuf Yu: Delight Stare Other Clinician: Referring Aizza Santiago: Beverlyn Roux Treating Richel Millspaugh/Extender: Frann Rider in Treatment: 0 Wound Status Wound Number: 2 Primary Open Surgical Wound Etiology: Wound Location: Right Gluteus Wound Open Wounding Event: Gradually Appeared Status: Date Acquired: 09/18/2016 Comorbid Anemia, Arrhythmia, Coronary Artery Weeks Of Treatment: 0 History: Disease, Hypotension, Type II Clustered Wound: No Diabetes, Osteoarthritis Photos Photo Uploaded By: Regan Lemming on 01/23/2017 16:27:49 Wound Measurements Length: (cm) 7.2 % Reduction i Width: (cm) 3.4 % Reduction i Depth: (cm) 5 Tunneling: Area: (cm) 19.227 Position Volume: (cm) 96.133 Maximum D n Area: n Volume: Yes (o'clock): 6 istance: (cm) 5 Wound Description Full Thickness Without Exposed Classification: Support Structures Wound Margin: Distinct, outline attached Exudate Large Amount: Exudate Type: Serosanguineous Exudate Color: red, brown Foul Odor After Cleansing: No Slough/Fibrino Yes Wound Bed Granulation Amount: Small (1-33%) Exposed Structure Mecum, Lalania S. ([id number]) Granulation Quality: Pink, Pale Fascia Exposed: No Necrotic Amount: Large (67-100%) Fat  Layer (Subcutaneous Tissue) Exposed: Yes Necrotic Quality: Adherent Slough Tendon Exposed: No Muscle Exposed: No Joint Exposed: No Bone Exposed: No Periwound Skin Texture Texture Color No Abnormalities Noted: No No Abnormalities Noted: No Callus: No Atrophie Blanche: No Crepitus: No Cyanosis: No Excoriation: No Ecchymosis: No Induration: No Erythema: No Rash: No Hemosiderin Staining: No Scarring: No Mottled: No Pallor: No Moisture Rubor: No No Abnormalities Noted: No Dry / Scaly: No Temperature / Pain Maceration: No Temperature: No  Abnormality Wound Preparation Ulcer Cleansing: Rinsed/Irrigated with Saline Topical Anesthetic Applied: Other: lidocaine 4%, Treatment Notes Wound #2 (Right Gluteus) 1. Cleansed with: Clean wound with Normal Saline 4. Dressing Applied: Santyl Ointment 5. Secondary Dressing Applied Bordered Foam Dressing Dry Gauze 7. Secured with Recruitment consultant) Signed: 01/23/2017 4:20:48 PM By: Regan Lemming [name], RN Entered By: Regan Lemming on 01/23/2017 09:52:30 Whaling, [name] ([id number]) -------------------------------------------------------------------------------- Vitals Details Patient Name: Jamie Martyr. Date of Service: 01/23/2017 9:00 AM Medical Record Number: [id number] Patient Account Number: [id number] Date of Birth/Sex: Jan 10, 1966 (51 y.o. Female) Treating RN: Afful, RN, [name], [facility] Augusta Hilbert: Delight Stare Other Clinician: Referring Leyah Bocchino: Beverlyn Roux Treating Sutton Plake/Extender: Frann Rider in Treatment: 0 Vital Signs Time Taken: 09:22 Temperature (F): 97.8 Height (in): 67 Pulse (bpm): 100 Source: Stated Respiratory Rate (breaths/min): 18 Weight (lbs): 137 Blood Pressure (mmHg): 56/44 Source: Measured Reference Range: 80 - 120 mg / dl Body Mass Index (BMI): 21.5 Electronic Signature(s) Signed: 01/23/2017 4:20:48 PM By: Regan Lemming [name], RN Entered By: Regan Lemming on 01/23/2017 09:22:49

## 2017-01-24 ENCOUNTER — Encounter: Payer: Self-pay | Admitting: Surgery

## 2017-01-24 ENCOUNTER — Ambulatory Visit: Payer: Medicare Other

## 2017-01-24 NOTE — Progress Notes (Addendum)
Jamie, [name] ([id number]) Visit Report for 01/23/2017 Chief Complaint Document Details Patient Name: [name], [name]. Date of Service: 01/23/2017 9:00 AM Medical Record Number: [id number] Patient Account Number: [id number] Date of Birth/Sex: [birth year] (51 y.o. Female) Treating RN: Afful, RN, [name], Velva Harman Primary Care Provider: Delight Stare Other Clinician: Referring Provider: Delight Stare Treating Provider/Extender: Frann Rider in Treatment: 0 Information Obtained from: Patient Chief Complaint Patients presents for treatment of an open diabetic ulcer with an arterial etiology on her right lateral ankle and right gluteal area for about 4 months Electronic Signature([name]) Signed: 01/23/2017 10:55:32 AM By: Christin Fudge MD, FACS Entered By: Christin Fudge on 01/23/2017 10:55:32 [place], [name] ([id number]) -------------------------------------------------------------------------------- Debridement Details Patient Name: [name]. Date of Service: 01/23/2017 9:00 AM Medical Record Number: [id number] Patient Account Number: [id number] Date of Birth/Sex: Mar 19, 1966 (51 y.o. Female) Treating RN: Afful, RN, [name], Administrator, sports Primary Care Provider: Delight Stare Other Clinician: Referring Provider: Delight Stare Treating Provider/Extender: Frann Rider in Treatment: 0 Debridement Performed for Wound #2 Right Gluteus Assessment: Performed By: Physician Christin Fudge, MD Debridement: Debridement Pre-procedure Yes - 10:20 Verification/Time Out Taken: Start Time: 10:20 Pain Control: Lidocaine 4% Topical Solution Level: Skin/Subcutaneous Tissue Total Area Debrided (L x 2 (cm) x 2 (cm) = 4 (cm) W): Tissue and other Non-Viable, Fibrin/Slough, Subcutaneous material debrided: Instrument: Forceps, Scissors Bleeding: Minimum Hemostasis Achieved: Pressure End Time: 10:24 Procedural Pain: 3 Post Procedural Pain: 3 Response to Treatment: Procedure was tolerated well Post Debridement  Measurements of Total Wound Length: (cm) 7.2 Width: (cm) 3.4 Depth: (cm) 5 Volume: (cm) 96.133 Character of Wound/Ulcer Post Stable Debridement: Severity of Tissue Post Debridement: Fat layer exposed Post Procedure Diagnosis Same as Pre-procedure Electronic Signature([name]) Signed: 01/23/2017 10:54:55 AM By: Christin Fudge MD, FACS Signed: 01/23/2017 4:20:48 PM By: Regan Lemming [name], RN Entered By: Christin Fudge on 01/23/2017 10:54:54 [name], [name]. ([id number]) [place], [name]. ([id number]) -------------------------------------------------------------------------------- HPI Details Patient Name: [name], [name]. Date of Service: 01/23/2017 9:00 AM Medical Record Number: [id number] Patient Account Number: [id number] Date of Birth/Sex: 03/03/1966 (51 y.o. Female) Treating RN: Afful, RN, [name], Velva Harman Primary Care Provider: Delight Stare Other Clinician: Referring Provider: Delight Stare Treating Provider/Extender: Frann Rider in Treatment: 0 History of Present Illness Location: right lateral ankle and right gluteal area Quality: Patient reports experiencing a sharp pain to affected area([name]). Severity: Patient states wound are getting worse. Duration: Patient has had the wound for > 4 months prior to seeking treatment at the wound center Timing: Pain in wound is constant (hurts all the time) Context: The wound occurred when the patient was in hospital with a necrotizing fasciitis of the right gluteal area and a ulceration on the right ankle Modifying Factors: Other treatment([name]) tried include:admitted to the hospital for IV antibiotics and a full workup and has also had a recent angiogram Associated Signs and Symptoms: Patient reports having increase discharge. HPI Description: 51 year old patient was sent to [place] from [facility] where she was seen by Dr. Sherril Cong for a left ankle ulceration and was recently hospitalized with hypotension and sepsis. She was treated with IV  antibiotics and has been scheduled to see Dr. Sharol Given. She was seen by vascular surgery who recommended a femoral bypass but surgery has been delayed until her sacral wound from last year'[name] necrotizing fasciitis has healed. Was seeing the wound care team at [facility] but wanted to change over. She is a smoker and smokes a pack of cigarettes a day The patient was recently  admitted in [place] between February 2 and February 14. She had a follow- up to see vascular surgery, orthopedic surgery and infectious disease. 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 Review of the electronic medical records reveals that Dr. 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 An MRI showed edema in the bone which was consistent with osteomyelitis and the chronic ulcer and may need surgical intervention. She also had a sacral decubitus ulcer which was treated with the wound VAC. On 01/07/2017 she was taken up by the vascular surgeon Dr. Trula Slade for an abdominal aortogram and bilateral lower extremity runoff, for a history of having bilateral femoropopliteal bypass graft as well as external iliac stenting on the right and stenting of her bypass graft. She had developed a nonhealing wound on her right ankle and there was a possibility of a femoral occlusion. the findings were noted and the impression was a surgical revascularization with a aorto bifemoral bypass graft. Both the femoropopliteal bypass grafts were patent. Electronic Signature([name]) Signed: 01/31/2017 2:07:38 PM By: Christin Fudge MD,  FACS Previous Signature: 01/23/2017 10:57:01 AM Version By: Christin Fudge MD, FACS Previous Signature: 01/23/2017 9:56:18 AM Version By: Christin Fudge MD, FACS Previous Signature: 01/23/2017 9:56:04 AM Version By: Christin Fudge MD, FACS Previous Signature: 01/23/2017 9:39:57 AM Version By: Christin Fudge MD, FACS [name], [name] ([id number]) Previous Signature: 01/23/2017 9:35:02 AM Version By: Christin Fudge MD, FACS Entered By: Christin Fudge on 01/31/2017 14:07:38 [name], [name] ([id number]) -------------------------------------------------------------------------------- Physical Exam Details Patient Name: SHANEQUE, MERKLE [name]. Date of Service: 01/23/2017 9:00 AM Medical Record Number: [id number] Patient Account Number: [id number] Date of Birth/Sex: 19-Jan-1966 (51 y.o. Female) Treating RN: Baruch Gouty, RN, [name], [employer] Primary Care Provider: Delight Stare Other Clinician: Referring Provider: Delight Stare Treating Provider/Extender: Frann Rider in Treatment: 0 Constitutional . Pulse regular. Respirations normal and unlabored. Afebrile. . Eyes Nonicteric. Reactive to light. Ears, Nose, Mouth, and Throat Lips, teeth, and gums WNL.Marland Kitchen Moist mucosa without lesions. Neck supple and nontender. No palpable supraclavicular or cervical adenopathy. Normal sized without goiter. Respiratory WNL. No retractions.. Breath sounds WNL, No rubs, rales, rhonchi, or wheeze.. Cardiovascular Pedal Pulses WNL. ABI on the left is 1.5 which is noncompressible and on the right is 1.0. No clubbing, cyanosis or edema.. Chest Breasts symmetical and no nipple discharge.. Breast tissue WNL, no masses, lumps, or tenderness.. Gastrointestinal (GI) Abdomen without masses or tenderness.. No liver or spleen enlargement or tenderness.. Lymphatic No adneopathy. No adenopathy. No adenopathy. Musculoskeletal Adexa without tenderness or enlargement.. Digits and nails w/o clubbing, cyanosis, infection, petechiae, ischemia, or inflammatory  conditions.. Integumentary (Hair, Skin) No suspicious lesions. No crepitus or fluctuance. No peri-wound warmth or erythema. No masses.Marland Kitchen Psychiatric Judgement and insight Intact.. No evidence of depression, anxiety, or agitation.. Notes the right lateral ankle wound undermines significantly and there is some necrotic debris which is adherent to the subcutaneous tissue and cannot be sharply debrided. The right gluteal wound probes down towards the ischial tuberosity but I cannot palpate bone. There was a lot of necrotic debris which are sharply removed with a forceps and scissors. No bleeding. Electronic Signature([name]) Signed: 01/31/2017 2:08:12 PM  By: Christin Fudge MD, FACS Previous Signature: 01/23/2017 10:58:11 AM Version By: Christin Fudge MD, FACS [name], [name] ([id number]) Entered By: Christin Fudge on 01/31/2017 [place], [name] ([id number]) -------------------------------------------------------------------------------- Physician Orders Details Patient Name: MILY, MALECKI. Date of Service: 01/23/2017 9:00 AM Medical Record Number: [id number] Patient Account Number: [id number] Date of Birth/Sex: 19-Jan-1966 (51 y.o. Female) Treating RN: Afful, RN, [name], Velva Harman Primary Care Provider: Delight Stare Other Clinician: Referring Provider: Delight Stare Treating Provider/Extender: Frann Rider in Treatment: 0 Verbal / Phone Orders: No Diagnosis Coding Wound Cleansing Wound #1 Right,Lateral Malleolus o Clean wound with Normal Saline. Wound #2 Right Gluteus o Clean wound with Normal Saline. Anesthetic Wound #1 Right,Lateral Malleolus o Topical Lidocaine 4% cream applied to wound bed prior to debridement Wound #2 Right Gluteus o Topical Lidocaine 4% cream applied to wound bed prior to debridement Skin Barriers/Peri-Wound Care Wound #1 Right,Lateral Malleolus o Skin Prep Wound #2 Right Gluteus o Skin Prep Primary Wound Dressing Wound #1 Right,Lateral  Malleolus o Santyl Ointment - under wound vac Wound #2 Right Gluteus o Santyl Ointment - under wound vac Secondary Dressing Wound #1 Right,Lateral Malleolus o Dry Gauze o Boardered Foam Dressing Dressing Change Frequency Wound #1 Right,Lateral Malleolus o Change dressing every day. RAKHI, ROMAGNOLI ([id number]) Wound #2 Right Gluteus o Change Dressing Monday, Wednesday, Friday Follow-up Appointments Wound #1 Right,Lateral Malleolus o Return Appointment in 1 week. Wound #2 Right Gluteus o Return Appointment in 1 week. Additional Orders / Instructions Wound #1 Right,Lateral Malleolus o Stop Smoking o Increase protein intake. o Activity as tolerated o Other: - Vit C, A, Zinc Wound #2 Right Gluteus o Stop Smoking o Increase protein intake. o Activity as tolerated o Other: - Vit C, A, Zinc Home Health Wound #1 [place] Visits - [place] Nurse may visit PRN to address patientos wound care needs. o FACE TO FACE ENCOUNTER: MEDICARE and MEDICAID PATIENTS: I certify that this patient is under my care and that I had a face-to-face encounter that meets the physician face-to-face encounter requirements with this patient on this date. The encounter with the patient was in whole or in part for the following MEDICAL CONDITION: (primary reason for [place]) MEDICAL NECESSITY: I certify, that based on my findings, NURSING services are a medically necessary home health service. HOME BOUND STATUS: I certify that my clinical findings support that this patient is homebound (i.e., Due to illness or injury, pt requires aid of supportive devices such as crutches, cane, wheelchairs, walkers, the use of special transportation or the assistance of another person to leave their place of residence. There is a normal inability to leave the home and doing so requires considerable and taxing effort.  Other absences are for medical reasons / religious services and are infrequent or of short duration when for other reasons). o If current dressing causes regression in wound condition, may D/C ordered dressing product/[name] and apply Normal Saline Moist Dressing daily until next [place] / Other MD appointment. [place] of regression in wound condition at [phone]. o Please direct any NON-WOUND related issues/requests for orders to patient'[name] Primary Care Physician Wound #2 Right [place] Visits - [place] ([id number]) o [place] Nurse may visit PRN to address patientos wound care needs. o FACE TO FACE ENCOUNTER: MEDICARE and MEDICAID PATIENTS: I certify that this patient is under my care and that I had  a face-to-face encounter that meets the physician face-to-face encounter requirements with this patient on this date. The encounter with the patient was in whole or in part for the following MEDICAL CONDITION: (primary reason for [place]) MEDICAL NECESSITY: I certify, that based on my findings, NURSING services are a medically necessary home health service. HOME BOUND STATUS: I certify that my clinical findings support that this patient is homebound (i.e., Due to illness or injury, pt requires aid of supportive devices such as crutches, cane, wheelchairs, walkers, the use of special transportation or the assistance of another person to leave their place of residence. There is a normal inability to leave the home and doing so requires considerable and taxing effort. Other absences are for medical reasons / religious services and are infrequent or of short duration when for other reasons). o If current dressing causes regression in wound condition, may D/C ordered dressing product/[name] and apply Normal Saline Moist Dressing daily until next [place] / Other MD appointment. [place] of regression in wound condition at [phone]. o Please direct any NON-WOUND related issues/requests for orders to patient'[name] Primary Care Physician Negative Pressure Wound Therapy Wound #2 Right Gluteus o Wound VAC settings at 125/130 mmHg continuous pressure. Use BLACK/GREEN foam to wound cavity. Use WHITE foam to fill any tunnel/[name] and/or undermining. Change VAC dressing 3 X WEEK. Change canister as indicated when full. Nurse may titrate settings and frequency of dressing changes as clinically indicated. Medications-please add to medication list. Wound #1 Right,Lateral Malleolus o Santyl Enzymatic Ointment Wound #2 Right Gluteus o Santyl Enzymatic Ointment Patient Medications Allergies: penicillin Notifications Medication Indication Start End Santyl 01/23/2017 DOSE topical 250 unit/gram ointment - ointment topical as directed Electronic Signature([name]) Signed: 01/23/2017 10:59:18 AM By: Christin Fudge MD, FACS Entered By: Christin Fudge on 01/23/2017 10:59:18 Zee, Amilyah Chauncey Cruel ([id number]) -------------------------------------------------------------------------------- Problem List Details Patient Name: [name]. Date of Service: 01/23/2017 9:00 AM Medical Record Number: [id number] Patient Account Number: [id number] Date of Birth/Sex: 10/21/1966 (51 y.o. Female) Treating RN: Afful, RN, [name], Velva Harman Primary Care Provider: Delight Stare Other Clinician: Referring Provider: Delight Stare Treating Provider/Extender: Frann Rider in Treatment: 0 Active Problems ICD-10 Encounter Code Description Active Date Diagnosis E11.621 Type 2 diabetes mellitus with foot ulcer 01/23/2017 Yes L89.314 Pressure ulcer of right buttock, stage 4 01/23/2017 Yes L97.312 Non-pressure chronic ulcer of right ankle with fat layer 01/23/2017 Yes exposed F17.218 Nicotine dependence, cigarettes, with other nicotine- 01/23/2017 Yes induced disorders I70.233 Atherosclerosis of native  arteries of right leg with 01/23/2017 Yes ulceration of ankle M86.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, right ankle and foot 01/23/2017 Yes Inactive Problems Resolved Problems Electronic Signature([name]) Signed: 01/23/2017 10:54:37 AM By: Christin Fudge MD, FACS Entered By: Christin Fudge on 01/23/2017 10:54:37 [place], Nancey Chauncey Cruel ([id number]) -------------------------------------------------------------------------------- Progress Note Details Patient Name: [name]. Date of Service: 01/23/2017 9:00 AM Medical Record Number: [id number] Patient Account Number: [id number] Date of Birth/Sex: Jun 11, 1966 (51 y.o. Female) Treating RN: Afful, RN, [name], Administrator, sports Primary Care Provider: Delight Stare Other Clinician: Referring Provider: Delight Stare Treating Provider/Extender: Frann Rider in Treatment: 0 Subjective Chief Complaint Information obtained from Patient Patients presents for treatment of an open diabetic ulcer with an arterial etiology on her right lateral ankle and right gluteal area for about 4 months History of Present Illness (HPI) The following HPI elements were documented for the patient'[name] wound: Location: right lateral ankle and right gluteal area Quality: Patient reports experiencing a sharp pain to affected area([name]). Severity: Patient  states wound are getting worse. Duration: Patient has had the wound for > 4 months prior to seeking treatment at the wound center Timing: Pain in wound is constant (hurts all the time) Context: The wound occurred when the patient was in hospital with a necrotizing fasciitis of the right gluteal area and a ulceration on the right ankle Modifying Factors: Other treatment([name]) tried include:admitted to the hospital for IV antibiotics and a full workup and has also had a recent angiogram Associated Signs and Symptoms: Patient reports having increase discharge. 51 year old patient was sent to [place] from [facility] where she was seen by Dr.  Sherril Cong for a left ankle ulceration and was recently hospitalized with hypotension and sepsis. She was treated with IV antibiotics and has been scheduled to see Dr. Sharol Given. She was seen by vascular surgery who recommended a femoral bypass but surgery has been delayed until her sacral wound from last year'[name] necrotizing fasciitis has healed. Was seeing the wound care team at [facility] but wanted to change over. She is a smoker and smokes a pack of cigarettes a day The patient was recently admitted in [place] between February 2 and February 14. She had a follow- up to see vascular surgery, orthopedic surgery and infectious disease. 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 Review of the electronic medical records reveals that Dr. 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 An MRI showed edema in the bone which was consistent with osteomyelitis and the chronic ulcer and may need surgical intervention. She also had a sacral decubitus ulcer which was treated with the wound VAC. On 01/07/2017 she was taken up by the vascular surgeon Dr. Trula Slade for an abdominal aortogram and bilateral lower extremity runoff, for a history of having bilateral femoropopliteal bypass graft as well as external iliac stenting on the right and stenting of her bypass graft. She had developed a nonhealing wound on her right ankle and there was a possibility of a femoral occlusion. the findings were noted and the [name], [name]. ([id number]) impression was a surgical revascularization with a aorto  bifemoral bypass graft. Both the femoropopliteal bypass grafts were patent. Wound History Patient presents with 2 open wounds that have been present for approximately months. Patient has been treating wounds in the following manner: wound vac/ silver alginate. Laboratory tests have not been performed in the last month. Patient reportedly has not tested positive for an antibiotic resistant organism. Patient reportedly has not tested positive for osteomyelitis. Patient reportedly has not had testing performed to evaluate circulation in the legs. Patient experiences the following problems associated with their wounds: infection. Patient History Information obtained from Patient, Caregiver, Chart. Allergies penicillin Family History Diabetes - Mother, Father, Maternal Grandparents, Paternal Grandparents, Child, Heart Disease - Father, Mother, Maternal Grandparents, Paternal Grandparents, Hereditary Spherocytosis - Mother, Hypertension - Mother, Siblings, Paternal Grandparents, Maternal Grandparents, Seizures - Maternal Grandparents, Paternal Grandparents, Thyroid Problems - Siblings, No family history of Cancer, Kidney Disease, Lung Disease, Stroke, Tuberculosis. Social History Current every day smoker, Marital Status - Married, Alcohol Use - Rarely, Drug Use - No History, Caffeine Use - Moderate. Medical History  Eyes Denies history of Cataracts, Glaucoma, Optic Neuritis Ear/Nose/Mouth/Throat Denies history of Chronic sinus problems/congestion, Middle ear problems Hematologic/Lymphatic Patient has history of Anemia Denies history of Hemophilia, Human Immunodeficiency Virus, Lymphedema, Sickle Cell Disease Respiratory Denies history of Aspiration, Asthma, Chronic Obstructive Pulmonary Disease (COPD), Pneumothorax, Sleep Apnea, Tuberculosis Cardiovascular Patient has history of Arrhythmia, Coronary Artery Disease, Hypotension, Peripheral Arterial Disease Gastrointestinal Denies history  of Cirrhosis , Colitis, Crohn [name], Hepatitis A, Hepatitis B, Hepatitis C Endocrine Patient has history of Type II Diabetes Genitourinary Denies history of End Stage Renal Disease Immunological Denies history of Lupus Erythematosus, Raynaud [name], Scleroderma Scheur[place], [place] [name]. ([id number]) Integumentary (Skin) Denies history of History of Burn, History of pressure wounds Musculoskeletal Patient has history of Osteoarthritis Neurologic Denies history of Dementia, Neuropathy, Quadriplegia, Paraplegia, Seizure Disorder Oncologic Denies history of Received Chemotherapy, Received Radiation Psychiatric Denies history of Anorexia/bulimia, Confinement Anxiety Patient is treated with Insulin, Oral Agents. Blood sugar is tested. Blood sugar results noted at the following times: Bedtime - 190. Review of Systems (ROS) Constitutional Symptoms (General Health) The patient has no complaints or symptoms. Eyes The patient has no complaints or symptoms. Ear/Nose/Mouth/Throat The patient has no complaints or symptoms. Hematologic/Lymphatic The patient has no complaints or symptoms. Respiratory The patient has no complaints or symptoms. Cardiovascular Complains or has symptoms of LE edema. Gastrointestinal The patient has no complaints or symptoms. Endocrine The patient has no complaints or symptoms. Genitourinary The patient has no complaints or symptoms. Immunological The patient has no complaints or symptoms. Integumentary (Skin) Complains or has symptoms of Wounds, Breakdown, Swelling. Musculoskeletal The patient has no complaints or symptoms. Neurologic The patient has no complaints or symptoms. Oncologic The patient has no complaints or symptoms. Psychiatric The patient has no complaints or symptoms. Objective [name], [name]. ([id number]) Constitutional Pulse regular. Respirations normal and unlabored. Afebrile. Vitals Time Taken: 9:22 AM, Height: 67 in, Source: Stated, Weight: 137  lbs, Source: Measured, BMI: 21.5, Temperature: 97.8 F, Pulse: 100 bpm, Respiratory Rate: 18 breaths/min, Blood Pressure: 56/44 mmHg. Eyes Nonicteric. Reactive to light. Ears, Nose, Mouth, and Throat Lips, teeth, and gums WNL.Marland Kitchen Moist mucosa without lesions. Neck supple and nontender. No palpable supraclavicular or cervical adenopathy. Normal sized without goiter. Respiratory WNL. No retractions.. Breath sounds WNL, No rubs, rales, rhonchi, or wheeze.. Cardiovascular Pedal Pulses WNL. ABI on the left is 1.5 which is noncompressible and on the right is 1.0. No clubbing, cyanosis or edema.. Chest Breasts symmetical and no nipple discharge.. Breast tissue WNL, no masses, lumps, or tenderness.. Gastrointestinal (GI) Abdomen without masses or tenderness.. No liver or spleen enlargement or tenderness.. Lymphatic No adneopathy. No adenopathy. No adenopathy. Musculoskeletal Adexa without tenderness or enlargement.. Digits and nails w/o clubbing, cyanosis, infection, petechiae, ischemia, or inflammatory conditions.Marland Kitchen Psychiatric Judgement and insight Intact.. No evidence of depression, anxiety, or agitation.. General Notes: the right lateral ankle wound undermines significantly and there is some necrotic debris which is adherent to the subcutaneous tissue and cannot be sharply debrided. The right gluteal wound probes down towards the ischial tuberosity but I cannot palpate bone. There was a lot of necrotic debris which are sharply removed with a forceps and scissors. No bleeding. Integumentary (Hair, Skin) No suspicious lesions. No crepitus or fluctuance. No peri-wound warmth or erythema. No masses.. Wound #1 status is Open. Original cause of wound was Gradually Appeared. The wound is located on the [place], [place] ([id number]) Right,Lateral Malleolus. The wound measures 2cm length x 2.5cm width x 0.5cm depth; 3.927cm^2 area and 1.963cm^3 volume.  The wound is limited to skin breakdown. There  is no tunneling noted, however, there is undermining starting at 6:00 and ending at 12:00 with a maximum distance of 2cm. There is a medium amount of serosanguineous drainage noted. The wound margin is flat and intact. There is small (1-33%) pink granulation within the wound bed. There is a large (67-100%) amount of necrotic tissue within the wound bed including Adherent Slough. The periwound skin appearance did not exhibit: Callus, Crepitus, Excoriation, Induration, Rash, Scarring, Dry/Scaly, Maceration, Atrophie Blanche, Cyanosis, Ecchymosis, Hemosiderin Staining, Mottled, Pallor, Rubor, Erythema. Periwound temperature was noted as No Abnormality. The periwound has tenderness on palpation. Wound #2 status is Open. Original cause of wound was Gradually Appeared. The wound is located on the Right Gluteus. The wound measures 7.2cm length x 3.4cm width x 5cm depth; 19.227cm^2 area and 96.133cm^3 volume. There is Fat Layer (Subcutaneous Tissue) Exposed exposed. There is tunneling at 6:00 with a maximum distance of 5cm. There is a large amount of serosanguineous drainage noted. The wound margin is distinct with the outline attached to the wound base. There is small (1-33%) pink, pale granulation within the wound bed. There is a large (67-100%) amount of necrotic tissue within the wound bed including Adherent Slough. The periwound skin appearance did not exhibit: Callus, Crepitus, Excoriation, Induration, Rash, Scarring, Dry/Scaly, Maceration, Atrophie Blanche, Cyanosis, Ecchymosis, Hemosiderin Staining, Mottled, Pallor, Rubor, Erythema. Periwound temperature was noted as No Abnormality. 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 After review have recommended: 1. Santyl ointment with a packing to be applied to the right ankle, change daily 2. Santyl ointment to be applied under the foam of the wound VAC, to the right gluteal region to be changed 3 times a week. 3. X-ray of the right ischial tuberosity [name], [name]. ([id number]) 4. Follow-up with Dr. Sharol Given regarding the right fibula bone biopsy. 5. Follow up with infectious disease regarding long-term antibiotics 6. I have spent over 3 minutes discussing with her the need to completely give up smoking and have discussed the risks benefits alternatives and the importance 7. adequate protein, vitamin A, vitamin C and zinc 8. arterial surgery has been put off till her other issues are resolved She and husband have read all questions answered and will be compliant Procedures Wound #2 Wound #2 is an Open Surgical Wound located on the Right Gluteus . There was a Skin/Subcutaneous Tissue Debridement ([id number]) debridement with total area of 4 sq cm performed by Christin Fudge, MD. with the following instrument([name]): Forceps and Scissors to remove Non-Viable tissue/material including Fibrin/Slough and Subcutaneous after achieving pain control using Lidocaine 4% Topical Solution. A time out was conducted at 10:20, prior to the start of the procedure. A Minimum amount of bleeding was controlled with Pressure. The procedure was tolerated well with a pain level of 3 throughout and a pain level of 3  following the procedure. Post  Debridement Measurements: 7.2cm length x 3.4cm width x 5cm depth; 96.133cm^3 volume. Character of Wound/Ulcer Post Debridement is stable. Severity of Tissue Post Debridement is: Fat layer exposed. Post procedure Diagnosis Wound #2: Same as Pre-Procedure Plan Wound Cleansing: Wound #1 Right,Lateral Malleolus: Clean wound with Normal Saline. Wound #2 Right Gluteus: Clean wound with Normal Saline. Anesthetic: Wound #1 Right,Lateral Malleolus: Topical Lidocaine 4% cream applied to wound bed prior to debridement Wound #2 Right Gluteus: Topical Lidocaine 4% cream applied to wound bed prior to debridement Skin Barriers/Peri-Wound Care: Wound #1 Right,Lateral Malleolus: Skin Prep Wound #2 Right Gluteus: Skin Prep Primary Wound Dressing: Wound #1 Right,Lateral Malleolus: Santyl Ointment - under wound vac [name], [name]. ([id number]) Wound #2 Right Gluteus: Santyl Ointment - under wound vac Secondary Dressing: Wound #1 Right,Lateral Malleolus: Dry Gauze Boardered Foam Dressing Dressing Change Frequency: Wound #1 Right,Lateral Malleolus: Change dressing every day. Wound #2 Right Gluteus: Change Dressing Monday, Wednesday, Friday Follow-up Appointments: Wound #1 Right,Lateral Malleolus: Return Appointment in 1 week. Wound #2 Right Gluteus: Return Appointment in 1 week. Additional Orders / Instructions: Wound #1 Right,Lateral Malleolus: Stop Smoking Increase protein intake. Activity as tolerated Other: - Vit C, A, Zinc Wound #2 Right Gluteus: Stop Smoking Increase protein intake. Activity as tolerated Other: - Vit C, A, Zinc Home Health: Wound #1 Right,Lateral Malleolus: [place] Visits - [place] Nurse may visit PRN to address patient [name] wound care needs. FACE TO FACE ENCOUNTER: MEDICARE and MEDICAID PATIENTS: I certify that this patient is under my care and that I had a face-to-face encounter that meets the physician face-to-face  encounter requirements with this patient on this date. The encounter with the patient was in whole or in part for the following MEDICAL CONDITION: (primary reason for [place]) MEDICAL NECESSITY: I certify, that based on my findings, NURSING services are a medically necessary home health service. HOME BOUND STATUS: I certify that my clinical findings support that this patient is homebound (i.e., Due to illness or injury, pt requires aid of supportive devices such as crutches, cane, wheelchairs, walkers, the use of special transportation or the assistance of another person to leave their place of residence. There is a normal inability to leave the home and doing so requires considerable and taxing effort. Other absences are for medical reasons / religious services and are infrequent or of short duration when for other reasons). If current dressing causes regression in wound condition, may D/C ordered dressing product/[name] and apply Normal Saline Moist Dressing daily until next [place] / Other MD appointment. [place] of regression in wound condition at [phone]. Please direct any NON-WOUND related issues/requests for orders to patient'[name] Primary Care Physician Wound #2 Right Gluteus: [place] Nurse may visit PRN to address patient [name] wound care needs. FACE TO FACE ENCOUNTER: MEDICARE and MEDICAID PATIENTS: I certify that this patient is under my care and that I had a face-to-face encounter that meets the physician face-to-face encounter requirements with this patient on this date. The encounter with the patient was in whole or in part for the [name], [name]. ([id number]) following MEDICAL CONDITION: (primary reason for [place]) MEDICAL NECESSITY: I certify, that based on my findings, NURSING services are a medically necessary home health service. HOME BOUND STATUS: I certify that my clinical  findings support that this patient is homebound (i.e., Due to illness or injury, pt requires aid  of supportive devices such as crutches, cane, wheelchairs, walkers, the use of special transportation or the assistance of another person to leave their place of residence. There is a normal inability to leave the home and doing so requires considerable and taxing effort. Other absences are for medical reasons / religious services and are infrequent or of short duration when for other reasons). If current dressing causes regression in wound condition, may D/C ordered dressing product/[name] and apply Normal Saline Moist Dressing daily until next [place] / Other MD appointment. [place] of regression in wound condition at [phone]. Please direct any NON-WOUND related issues/requests for orders to patient'[name] Primary Care Physician Negative Pressure Wound Therapy: Wound #2 Right Gluteus: Wound VAC settings at 125/130 mmHg continuous pressure. Use BLACK/GREEN foam to wound cavity. Use WHITE foam to fill any tunnel/[name] and/or undermining. Change VAC dressing 3 X WEEK. Change canister as indicated when full. Nurse may titrate settings and frequency of dressing changes as clinically indicated. Medications-please add to medication list.: Wound #1 Right,Lateral Malleolus: Santyl Enzymatic Ointment Wound #2 Right Gluteus: Santyl Enzymatic Ointment The following medication([name]) was prescribed: Santyl topical 250 unit/gram ointment ointment topical as directed starting 01/23/2017 51 year old diabetic, who is also a smoker, has had chronic issues with her right gluteal region and right ankle and has also got significant underlying arteriosclerotic disease, which is going to need revision surgery. 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 After review have recommended: 1. Santyl ointment with a packing to be applied to the right ankle,  change daily 2. Santyl ointment to be applied under the foam of the wound VAC, to the right gluteal region to be changed 3 times a week. 3. X-ray of the right ischial tuberosity 4. Follow-up with Dr. Sharol Given regarding the right fibula bone biopsy. 5. Follow up with infectious disease regarding long-term antibiotics 6. I have spent over 3 minutes discussing with her the need to completely give up smoking and have discussed the risks benefits alternatives and the importance 7. adequate protein, vitamin A, vitamin C and zinc 8. arterial surgery has been put off till her other issues are resolved She and husband have read all questions answered and will be compliant ABENI, FINCHUM ([id number]) Electronic Signature([name]) Signed: 01/31/2017 2:10:03 PM By: Christin Fudge MD, FACS Previous Signature: 01/23/2017 11:16:29 AM Version By: Christin Fudge MD, FACS Previous Signature: 01/23/2017 11:03:03 AM Version By: Christin Fudge MD, FACS Entered By: Christin Fudge on 01/31/2017 14:10:03 [name], [name] ([id number]) -------------------------------------------------------------------------------- ROS/PFSH Details Patient Name: [name]. Date of Service: 01/23/2017 9:00 AM Medical Record Number: [id number] Patient Account Number: [id number] Date of Birth/Sex: Aug 20, 1966 (51 y.o. Female) Treating RN: Afful, RN, [name], [employer] Primary Care Provider: Delight Stare Other Clinician: Referring Provider: Delight Stare Treating Provider/Extender: Frann Rider in Treatment: 0 Information Obtained From Patient Caregiver Chart Wound History Do you currently have one or more open woundso Yes How many open wounds do you currently haveo 2 Approximately how long have you had your woundso months How have you been treating your wound([name]) until nowo wound vac/ silver alginate Has your wound([name]) ever healed and then re-openedo No Have you had any lab work done in the past montho No Have you tested positive for an  antibiotic resistant organism (MRSA, No VRE)o Have you tested positive for osteomyelitis (bone infection)o No Have you had any tests for circulation on your legso No Have you had other problems  associated with your woundso Infection Cardiovascular Complaints and Symptoms: Positive for: LE edema Medical History: Positive for: Arrhythmia; Coronary Artery Disease; Hypotension; Peripheral Arterial Disease Integumentary (Skin) Complaints and Symptoms: Positive for: Wounds; Breakdown; Swelling Medical History: Negative for: History of Burn; History of pressure wounds Constitutional Symptoms (General Health) Complaints and Symptoms: No Complaints or Symptoms Eyes Complaints and Symptoms: No Complaints or Symptoms Mayabb, Siriyah [name]. ([id number]) Medical History: Negative for: Cataracts; Glaucoma; Optic Neuritis Ear/Nose/Mouth/Throat Complaints and Symptoms: No Complaints or Symptoms Medical History: Negative for: Chronic sinus problems/congestion; Middle ear problems Hematologic/Lymphatic Complaints and Symptoms: No Complaints or Symptoms Medical History: Positive for: Anemia Negative for: Hemophilia; Human Immunodeficiency Virus; Lymphedema; Sickle Cell Disease Respiratory Complaints and Symptoms: No Complaints or Symptoms Medical History: Negative for: Aspiration; Asthma; Chronic Obstructive Pulmonary Disease (COPD); Pneumothorax; Sleep Apnea; Tuberculosis Gastrointestinal Complaints and Symptoms: No Complaints or Symptoms Medical History: Negative for: Cirrhosis ; Colitis; Crohnos; Hepatitis A; Hepatitis B; Hepatitis C Endocrine Complaints and Symptoms: No Complaints or Symptoms Medical History: Positive for: Type II Diabetes Treated with: Insulin, Oral agents Blood sugar tested every day: Yes Tested : 4 times a day Blood sugar testing results: Bedtime: 190 Genitourinary S[place]ard, [place] [name]. ([id number]) Complaints and Symptoms: No Complaints or Symptoms Medical  History: Negative for: End Stage Renal Disease Immunological Complaints and Symptoms: No Complaints or Symptoms Medical History: Negative for: Lupus Erythematosus; Raynaudos; Scleroderma Musculoskeletal Complaints and Symptoms: No Complaints or Symptoms Medical History: Positive for: Osteoarthritis Neurologic Complaints and Symptoms: No Complaints or Symptoms Medical History: Negative for: Dementia; Neuropathy; Quadriplegia; Paraplegia; Seizure Disorder Oncologic Complaints and Symptoms: No Complaints or Symptoms Medical History: Negative for: Received Chemotherapy; Received Radiation Psychiatric Complaints and Symptoms: No Complaints or Symptoms Medical History: Negative for: Anorexia/bulimia; Confinement Anxiety Immunizations Pneumococcal Vaccine: Received Pneumococcal Vaccination: No Family and Social History MCKENA, CHERN ([id number]) Cancer: No; Diabetes: Yes - Mother, Father, Maternal Grandparents, Paternal Grandparents, Child; Heart Disease: Yes - Father, Mother, Maternal Grandparents, Paternal Grandparents; Hereditary Spherocytosis: Yes - Mother; Hypertension: Yes - Mother, Siblings, Paternal Grandparents, Maternal Grandparents; Kidney Disease: No; Lung Disease: No; Seizures: Yes - Maternal Grandparents, Paternal Grandparents; Stroke: No; Thyroid Problems: Yes - Siblings; Tuberculosis: No; Current every day smoker; Marital Status - Married; Alcohol Use: Rarely; Drug Use: No History; Caffeine Use: Moderate; Financial Concerns: No; Food, Clothing or Shelter Needs: No; Support System Lacking: No; Transportation Concerns: No; Advanced Directives: No; Patient does not want information on Advanced Directives; Living Will: No Physician Affirmation I have reviewed and agree with the above information. Electronic Signature([name]) Signed: 01/23/2017 4:19:30 PM By: Christin Fudge MD, FACS Signed: 01/23/2017 4:20:48 PM By: Regan Lemming [name], RN Entered By: Regan Lemming on 01/23/2017  10:20:16 [place], [name] ([id number]) -------------------------------------------------------------------------------- SuperBill Details Patient Name: PIERRE, CUMPTON. Date of Service: 01/23/2017 Medical Record Number: [id number] Patient Account Number: [id number] Date of Birth/Sex: 1965-12-21 (51 y.o. 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 M86.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, right ankle and foot Facility Procedures CPT4 Code Description: [id number] - WOUND CARE VISIT-LEV 3 EST PT Modifier: Quantity: 1 CPT4 Code Description:  [id number] 11042 - DEB SUBQ TISSUE 20 SQ CM/< ICD-10 Description Diagnosis E11.621 Type 2 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, stage 4 M86.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, right ankle and  foot Modifier: Quantity: 1 CPT4 Code Description: [id number]-SMOKING CESSATION 3-10MINS ICD-10 Description Diagnosis E11.621 Type 2 diabetes mellitus with foot ulcer L97.312 Non-pressure chronic ulcer of right ankle with fat lay F17.218 Nicotine dependence, cigarettes, with  other nicotine-i Modifier: er exposed nduced diso Quantity: 1 rders Physician Procedures CPT4 Code Description: [id number] - WC PHYS LEVEL 4 - NEW PT ICD-10 Description Diagnosis E11.621 Type 2 diabetes mellitus with foot ulcer [place], Shreya [name]. ([id number]) Modifier: 25 Quantity: 1 Electronic  Signature([name]) Signed: 01/23/2017 11:03:50 AM By: Christin Fudge MD, FACS Entered By: Christin Fudge on 01/23/2017 11:03:50

## 2017-01-24 NOTE — Progress Notes (Signed)
ARTURO, FREUNDLICH ([id number]) Visit Report for 01/23/2017 Abuse/Suicide Risk Screen Details Patient Name: [name], [name]. Date of Service: 01/23/2017 9:00 AM Medical Record Number: [id number] Patient Account Number: [id number] Date of Birth/Sex: 05/15/1966 (51 y.o. Female) Treating RN: Afful, RN, [name], Velva Harman Primary Care Greydis Stlouis: Delight Stare Other Clinician: Referring Kenyette Gundy: Beverlyn Roux Treating Jarnell Cordaro/Extender: Frann Rider in Treatment: 0 Abuse/Suicide Risk Screen Items Answer ABUSE/SUICIDE RISK SCREEN: Has anyone close to you tried to hurt or harm you recentlyo No Do you feel uncomfortable with anyone in your familyo No Has anyone forced you do things that you didnot want to doo No Do you have any thoughts of harming yourselfo No Patient displays signs or symptoms of abuse and/or neglect. No Electronic Signature(s) Signed: 01/23/2017 4:20:48 PM By: Regan Lemming [name], RN Entered By: Regan Lemming on 01/23/2017 09:23:37 Huang, [name] ([id number]) -------------------------------------------------------------------------------- Activities of Daily Living Details Patient Name: [name], [name]. Date of Service: 01/23/2017 9:00 AM Medical Record Number: [id number] Patient Account Number: [id number] Date of Birth/Sex: 11-May-1966 (51 y.o. Female) Treating RN: Afful, RN, [name], Velva Harman Primary Care Barnard Sharps: Delight Stare Other Clinician: Referring Shada Nienaber: Beverlyn Roux Treating Sonia Bromell/Extender: Frann Rider in Treatment: 0 Activities of Daily Living Items Answer Activities of Daily Living (Please select one for each item) Drive Automobile Completely Able Take Medications Completely Able Use Telephone Completely Able Care for Appearance Completely Able Use Toilet Completely Able Bath / Shower Completely Able Dress Self Completely Able Feed Self Completely Able Walk Need Assistance Get In / Out Bed Completely [place] Need Assistance Shop for Self Need Assistance Electronic Signature(s) Signed: 01/23/2017 4:20:48 PM By: Regan Lemming [name], RN Entered By: Regan Lemming on 01/23/2017 09:24:21 Urschel, [name] ([id number]) -------------------------------------------------------------------------------- Education Assessment Details Patient Name: [name]. Date of Service: 01/23/2017 9:00 AM Medical Record Number: [id number] Patient Account Number: [id number] Date of Birth/Sex: 1966/05/15 (51 y.o. Female) Treating RN: Afful, RN, [name], Velva Harman Primary Care Corderius Saraceni: Delight Stare Other Clinician: Referring Holt Woolbright: Beverlyn Roux Treating Monti Villers/Extender: Frann Rider in Treatment: 0 Primary Learner Assessed: Patient Learning Preferences/Education Level/Primary Language Learning Preference: Explanation Highest Education Level: High School Preferred Language: English Cognitive Barrier Assessment/Beliefs Language Barrier: No Physical Barrier Assessment Impaired Vision: No Impaired Hearing: No Decreased Hand dexterity: No Knowledge/Comprehension Assessment Knowledge Level: Medium Comprehension Level: Medium Ability to understand written Medium instructions: Ability to understand verbal Medium instructions: Motivation Assessment Anxiety Level: Calm Cooperation: Cooperative Education Importance: Acknowledges Need Interest in Health Problems: Asks Questions Perception: Coherent Willingness to Engage in Self- Medium Management Activities: Readiness to Engage in Self- Medium Management Activities: Electronic Signature(s) Signed: 01/23/2017 4:20:48 PM By: Regan Lemming [name], RN Entered By: Regan Lemming on 01/23/2017 09:24:42 Waterson, [name] ([id number]) -------------------------------------------------------------------------------- Fall Risk Assessment Details Patient Name: [name]. Date of Service: 01/23/2017 9:00 AM Medical Record Number: [id number] Patient Account  Number: [id number] Date of Birth/Sex: [birth year] (51 y.o. Female) Treating RN: Afful, RN, [name], [employer] Primary Care Nanea Jared: Delight Stare Other Clinician: Referring Gustave Lindeman: Beverlyn Roux Treating Jozey Janco/Extender: Frann Rider in Treatment: 0 Fall Risk Assessment Items Have you had 2 or more falls in the last 12 monthso 0 No Have you had any fall that resulted in injury in the last 12 monthso 0 No FALL RISK ASSESSMENT: History of falling - immediate or within 3 months 0 No Secondary diagnosis 0 No Ambulatory aid None/bed rest/wheelchair/nurse 0 No Crutches/cane/walker 15 Yes Furniture 0 No IV Access/Saline Lock 0 No  Gait/Training Normal/bed rest/immobile 0 No Weak 10 Yes Impaired 20 Yes Mental Status Oriented to own ability 0 Yes Electronic Signature(s) Signed: 01/23/2017 4:20:48 PM By: Regan Lemming [name], RN Entered By: Regan Lemming on 01/23/2017 09:24:54 Krenn, Lena Chauncey Cruel ([id number]) -------------------------------------------------------------------------------- Foot Assessment Details Patient Name: [name]. Date of Service: 01/23/2017 9:00 AM Medical Record Number: [id number] Patient Account Number: [id number] Date of Birth/Sex: [birth year] (51 y.o. Female) Treating RN: Afful, RN, [name], Velva Harman Primary Care Zaire Vanbuskirk: Delight Stare Other Clinician: Referring Dahiana Kulak: Beverlyn Roux Treating Sadiyah Kangas/Extender: Frann Rider in Treatment: 0 Foot Assessment Items Site Locations + = Sensation present, - = Sensation absent, C = Callus, U = Ulcer R = Redness, W = Warmth, M = Maceration, PU = Pre-ulcerative lesion F = Fissure, S = Swelling, D = Dryness Assessment Right: Left: Other Deformity: No No Prior Foot Ulcer: No No Prior Amputation: No No Charcot Joint: No No Ambulatory Status: Ambulatory With Help Assistance Device: Walker Gait: Administrator, arts) Signed: 01/23/2017 4:20:48 PM By: Regan Lemming [name], RN Entered By: Regan Lemming on 01/23/2017  09:25:41 Ingraham, Jenisse Chauncey Cruel ([id number]) -------------------------------------------------------------------------------- Nutrition Risk Assessment Details Patient Name: [name]. Date of Service: 01/23/2017 9:00 AM Medical Record Number: [id number] Patient Account Number: [id number] Date of Birth/Sex: 1965/12/25 (51 y.o. Female) Treating RN: Afful, RN, [name], [employer] Primary Care Joylyn Duggin: Delight Stare Other Clinician: Referring Charistopher Rumble: Beverlyn Roux Treating Anabell Swint/Extender: Frann Rider in Treatment: 0 Height (in): 67 Weight (lbs): 137 Body Mass Index (BMI): 21.5 Nutrition Risk Assessment Items NUTRITION RISK SCREEN: I have an illness or condition that made me change the kind and/or 0 No amount of food I eat I eat fewer than two meals per day 0 No I eat few fruits and vegetables, or milk products 0 No I have three or more drinks of beer, liquor or wine almost every day 0 No I have tooth or mouth problems that make it hard for me to eat 0 No I don't always have enough money to buy the food I need 0 No I eat alone most of the time 0 No I take three or more different prescribed or over-the-counter drugs a 0 No day Without wanting to, I have lost or gained 10 pounds in the last six 2 Yes months I am not always physically able to shop, cook and/or feed myself 0 No Nutrition Protocols Good Risk Protocol Provide education on Moderate Risk Protocol 0 nutrition Electronic Signature(s) Signed: 01/23/2017 4:20:48 PM By: Regan Lemming [name], RN Entered By: Regan Lemming on 01/23/2017 09:25:18

## 2017-01-31 ENCOUNTER — Ambulatory Visit (INDEPENDENT_AMBULATORY_CARE_PROVIDER_SITE_OTHER): Payer: Medicare Other | Admitting: Family

## 2017-01-31 ENCOUNTER — Encounter: Payer: Medicare Other | Admitting: Surgery

## 2017-01-31 DIAGNOSIS — E11621 Type 2 diabetes mellitus with foot ulcer: Secondary | ICD-10-CM | POA: Diagnosis not present

## 2017-02-01 NOTE — Progress Notes (Addendum)
[name], [name] ([id number]) Visit Report for 01/31/2017 Chief Complaint Document Details Patient Name: [name], [name]. Date of Service: 01/31/2017 2:15 PM Medical Record Number: [id number] Patient Account Number: [id number] Date of Birth/Sex: [birth year] (51 y.o. Female) Treating RN: Afful, RN, [name], Velva Harman Primary Care Provider: Delight Stare Other Clinician: Referring Provider: Delight Stare Treating Provider/Extender: Frann Rider in Treatment: 1 Information Obtained from: Patient Chief Complaint Patients presents for treatment of an open diabetic ulcer with an arterial etiology on her right lateral ankle and right gluteal area for about 4 months Electronic Signature(s) Signed: 01/31/2017 2:48:01 PM By: Christin Fudge MD, FACS Entered By: Christin Fudge on 01/31/2017 14:48:01 [name], [name] ([id number]) -------------------------------------------------------------------------------- Debridement Details Patient Name: [name]. Date of Service: 01/31/2017 2:15 PM Medical Record Number: [id number] Patient Account Number: [id number] Date of Birth/Sex: 02-16-1966 (51 y.o. Female) Treating RN: Afful, RN, [name], Administrator, sports Primary Care Provider: Delight Stare Other Clinician: Referring Provider: Delight Stare Treating Provider/Extender: Frann Rider in Treatment: 1 Debridement Performed for Wound #1 Right,Lateral Malleolus Assessment: Performed By: Physician Christin Fudge, MD Debridement: Debridement Pre-procedure Yes - 14:33 Verification/Time Out Taken: Start Time: 14:33 Pain Control: Lidocaine 4% Topical Solution Level: Skin/Subcutaneous Tissue Total Area Debrided (L x 2 (cm) x 2.5 (cm) = 5 (cm) W): Tissue and other Non-Viable, Fibrin/Slough, Subcutaneous material debrided: Instrument: Curette Bleeding: Minimum Hemostasis Achieved: Pressure End Time: 14:36 Procedural Pain: 0 Post Procedural Pain: 0 Response to Treatment: Procedure was tolerated well Post  Debridement Measurements of Total Wound Length: (cm) 2 Width: (cm) 2.5 Depth: (cm) 0.5 Volume: (cm) 1.963 Character of Wound/Ulcer Post Requires Further Debridement Debridement: Severity of Tissue Post Debridement: Fat layer exposed Post Procedure Diagnosis Same as Pre-procedure Electronic Signature(s) Signed: 01/31/2017 2:47:53 PM By: Christin Fudge MD, FACS Signed: 01/31/2017 4:40:53 PM By: Regan Lemming [name], RN Entered By: Christin Fudge on 01/31/2017 14:47:53 [name], [name] ([id number]) [name], [name] ([id number]) -------------------------------------------------------------------------------- HPI Details Patient Name: [name], [name] Date of Service: 01/31/2017 2:15 PM Medical Record Number: [id number] Patient Account Number: [id number] Date of Birth/Sex: 1966-06-18 (51 y.o. Female) Treating RN: Afful, RN, [name], Velva Harman Primary Care Provider: Delight Stare Other Clinician: Referring Provider: Delight Stare Treating Provider/Extender: Frann Rider in Treatment: 1 History of Present Illness Location: right lateral ankle and right gluteal area Quality: Patient reports experiencing a sharp pain to affected area(s). Severity: Patient states wound are getting worse. Duration: Patient has had the wound for > 4 months prior to seeking treatment at the wound center Timing: Pain in wound is constant (hurts all the time) Context: The wound occurred when the patient was in hospital with a necrotizing fasciitis of the right gluteal area and a ulceration on the right ankle Modifying Factors: Other treatment(s) tried include:admitted to the hospital for IV antibiotics and a full workup and has also had a recent angiogram Associated Signs and Symptoms: Patient reports having increase discharge. HPI Description: 51 year old patient was sent to [place] from [facility] where she was seen by Dr. Sherril Cong for a left ankle ulceration and was recently hospitalized with hypotension and  sepsis. She was treated with IV antibiotics and has been scheduled to see Dr. Sharol Given. She was seen by vascular surgery who recommended a femoral bypass but surgery has been delayed until her sacral wound from last year's necrotizing fasciitis has healed. Was seeing the wound care team at [facility] but wanted to change over. She is a smoker and smokes a pack of cigarettes a day The patient was  recently admitted in [place] between February 2 and February 14. She had a follow- up to see vascular surgery, orthopedic surgery and infectious disease. 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 Review of the electronic medical records reveals that Dr. 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 An MRI showed edema in the bone which was consistent with osteomyelitis and the chronic ulcer and may need surgical intervention. She also had a sacral decubitus ulcer which was treated with the wound VAC. On 01/07/2017 she was taken up by the vascular surgeon Dr. Trula Slade for an abdominal aortogram and bilateral lower extremity runoff, for a history of having bilateral femoropopliteal bypass graft as well as external iliac stenting on the right and stenting of her bypass graft. She had developed a nonhealing wound on her right ankle and there was a possibility of a femoral occlusion. the findings were noted and the impression was a surgical revascularization with a aorto bifemoral bypass graft. Both the femoropopliteal bypass grafts were patent. 01/31/2017 -- x-ray of the right hip and  pelvis -- IMPRESSION:No radiographic evidence of acute osteomyelitis. Normal-appearing right hip joint space for age. No acute bony abnormality of the hip. 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 Since seeing her last week she has not had an appointment with infectious disease or the orthopedic specialist yet DONDI, [name] ([id number]) Electronic Signature(s) Signed: 01/31/2017 2:48:30 PM By: Christin Fudge MD, FACS Previous Signature: 01/31/2017 2:12:06 PM Version By: Christin Fudge MD, FACS Previous Signature: 01/31/2017 2:11:04 PM Version By: Christin Fudge MD, FACS Entered By: Christin Fudge on 01/31/2017 14:48:30 [name], [name] ([id number]) -------------------------------------------------------------------------------- Physical Exam Details Patient Name: SHALAINA, GUARDIOLA S. Date of Service: 01/31/2017 2:15 PM Medical Record Number: [id number] Patient Account Number: [id number] Date of Birth/Sex: 1966-01-25 (51 y.o. Female) Treating RN: Baruch Gouty, RN, [name], [employer] Primary Care Provider: Delight Stare Other Clinician: Referring Provider: Delight Stare Treating Provider/Extender: Frann Rider in Treatment: 1 Constitutional . Pulse regular. Respirations normal and unlabored. Afebrile. . Eyes Nonicteric. Reactive to light. Ears, Nose, Mouth, and Throat Lips, teeth, and gums WNL.Marland Kitchen Moist mucosa without lesions. Neck supple and nontender. No palpable supraclavicular or cervical adenopathy. Normal sized without goiter. Respiratory WNL. No retractions.. Cardiovascular Pedal Pulses WNL. No clubbing, cyanosis or edema. Lymphatic No adneopathy. No adenopathy. No adenopathy. Musculoskeletal Adexa without tenderness or enlargement.. Digits and nails w/o clubbing, cyanosis, infection, petechiae, ischemia, or inflammatory conditions.. Integumentary (Hair, Skin) No suspicious lesions. No crepitus or fluctuance. No peri-wound warmth or  erythema. No masses.Marland Kitchen Psychiatric Judgement and insight Intact.. No evidence of depression, anxiety, or agitation.. Notes the right ankle wound needed sharp debridement with #3 curet and a lot of the necrotic debris was removed. The right gluteal wound looks excellent and does not have any debris to be removed today. Electronic Signature(s) Signed: 01/31/2017 2:49:28 PM By: Christin Fudge MD, FACS Entered By: Christin Fudge on 01/31/2017 14:49:28 [name], [name] ([id number]) -------------------------------------------------------------------------------- Physician Orders Details Patient Name: SHRAVYA, WICKWIRE. Date of Service: 01/31/2017 2:15 PM Medical Record Number: [id number] Patient Account Number: [id number] Date  of Birth/Sex: 05/10/1966 (51 y.o. Female) Treating RN: Afful, RN, [name], Velva Harman Primary Care Provider: Delight Stare Other Clinician: Referring Provider: Delight Stare Treating Provider/Extender: Frann Rider in Treatment: 1 Verbal / Phone Orders: No Diagnosis Coding Wound Cleansing Wound #1 Right,Lateral Malleolus o Clean wound with Normal Saline. Wound #2 Right Gluteus o Clean wound with Normal Saline. Anesthetic Wound #1 Right,Lateral Malleolus o Topical Lidocaine 4% cream applied to wound bed prior to debridement Wound #2 Right Gluteus o Topical Lidocaine 4% cream applied to wound bed prior to debridement Skin Barriers/Peri-Wound Care Wound #1 Right,Lateral Malleolus o Skin Prep Wound #2 Right Gluteus o Skin Prep Primary Wound Dressing Wound #1 Right,Lateral Malleolus o Santyl Ointment - under wound vac Wound #2 Right Gluteus o Santyl Ointment - under wound vac Secondary Dressing Wound #1 Right,Lateral Malleolus o Dry Gauze o Boardered Foam Dressing Dressing Change Frequency Wound #1 Right,Lateral Malleolus o Change dressing every day. KEYAH, BLIZARD ([id number]) Wound #2 Right Gluteus o Change Dressing Monday, Wednesday,  Friday Follow-up Appointments Wound #1 Right,Lateral Malleolus o Return Appointment in 1 week. Wound #2 Right Gluteus o Return Appointment in 1 week. Additional Orders / Instructions Wound #1 Right,Lateral Malleolus o Stop Smoking o Increase protein intake. o Activity as tolerated o Other: - Vit C, A, Zinc Wound #2 Right Gluteus o Stop Smoking o Increase protein intake. o Activity as tolerated o Other: - Vit C, A, Zinc Home Health Wound #1 [place] [facility] Nurse may visit PRN to address patientos wound care needs. o FACE TO FACE ENCOUNTER: MEDICARE and MEDICAID PATIENTS: [name] certify that this patient is under my care and that [name] had a face-to-face encounter that meets the physician face-to-face encounter requirements with this patient on this date. The encounter with the patient was in whole or in part for the following MEDICAL CONDITION: (primary reason for [place]) MEDICAL NECESSITY: [name] certify, that based on my findings, NURSING services are a medically necessary home health service. HOME BOUND STATUS: [name] certify that my clinical findings support that this patient is homebound ([name].e., Due to illness or injury, pt requires aid of supportive devices such as crutches, cane, wheelchairs, walkers, the use of special transportation or the assistance of another person to leave their place of residence. There is a normal inability to leave the home and doing so requires considerable and taxing effort. Other absences are for medical reasons / religious services and are infrequent or of short duration when for other reasons). o If current dressing causes regression in wound condition, may D/C ordered dressing product/s and apply Normal Saline Moist Dressing daily until next [place] / Other MD appointment. [place] of regression in wound condition at  [phone]. o Please direct any NON-WOUND related issues/requests for orders to patient's Primary Care Physician Wound #2 Right [place] [facility] - [place] ([id number]) o [place] Nurse may visit PRN to address patientos wound care needs. o FACE TO FACE ENCOUNTER: MEDICARE and MEDICAID PATIENTS: [name] certify that this patient is under my care and that [name] had a face-to-face encounter that meets the physician face-to-face encounter requirements with this patient on this date. The encounter with the patient was in whole or in part for the following MEDICAL CONDITION: (primary reason for [place]) MEDICAL NECESSITY: [name] certify, that based on my findings, NURSING services are a medically necessary home health service.  HOME BOUND STATUS: [name] certify that my clinical findings support that this patient is homebound ([name].e., Due to illness or injury, pt requires aid of supportive devices such as crutches, cane, wheelchairs, walkers, the use of special transportation or the assistance of another person to leave their place of residence. There is a normal inability to leave the home and doing so requires considerable and taxing effort. Other absences are for medical reasons / religious services and are infrequent or of short duration when for other reasons). o If current dressing causes regression in wound condition, may D/C ordered dressing product/s and apply Normal Saline Moist Dressing daily until next [place] / Other MD appointment. [place] of regression in wound condition at [phone]. o Please direct any NON-WOUND related issues/requests for orders to patient's Primary Care Physician Negative Pressure Wound Therapy Wound #2 Right Gluteus o Wound VAC settings at 125/130 mmHg continuous pressure. Use BLACK/GREEN foam to wound cavity. Use WHITE foam to fill any tunnel/s and/or undermining. Change  VAC dressing 3 X WEEK. Change canister as indicated when full. Nurse may titrate settings and frequency of dressing changes as clinically indicated. Medications-please add to medication list. Wound #1 Right,Lateral Malleolus o Santyl Enzymatic Ointment Wound #2 Right Gluteus o Santyl Enzymatic Ointment Electronic Signature(s) Signed: 01/31/2017 3:55:11 PM By: Christin Fudge MD, FACS Signed: 01/31/2017 4:40:53 PM By: Regan Lemming [name], RN Entered By: Regan Lemming on 01/31/2017 14:34:03 [name], [name] ([id number]) -------------------------------------------------------------------------------- Problem List Details Patient Name: [name], MEDITZ. Date of Service: 01/31/2017 2:15 PM Medical Record Number: [id number] Patient Account Number: [id number] Date of Birth/Sex: 1965/11/30 (51 y.o. Female) Treating RN: Afful, RN, [name], Velva Harman Primary Care Provider: Delight Stare Other Clinician: Referring Provider: Delight Stare Treating Provider/Extender: Frann Rider in Treatment: 1 Active Problems ICD-10 Encounter Code Description Active Date Diagnosis E11.621 Type 2 diabetes mellitus with foot ulcer 01/23/2017 Yes L89.314 Pressure ulcer of right buttock, stage 4 01/23/2017 Yes L97.312 Non-pressure chronic ulcer of right ankle with fat layer 01/23/2017 Yes exposed F17.218 Nicotine dependence, cigarettes, with other nicotine- 01/23/2017 Yes induced disorders I70.233 Atherosclerosis of native arteries of right leg with 01/23/2017 Yes ulceration of ankle M86.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, right ankle and foot 01/23/2017 Yes Inactive Problems Resolved Problems Electronic Signature(s) Signed: 01/31/2017 2:47:38 PM By: Christin Fudge MD, FACS Entered By: Christin Fudge on 01/31/2017 14:47:38 [name], [place]. ([id number]) -------------------------------------------------------------------------------- Progress Note Details Patient Name: [name]. Date of Service: 01/31/2017 2:15 PM Medical  Record Number: [id number] Patient Account Number: [id number] Date of Birth/Sex: Jun 20, 1966 (51 y.o. Female) Treating RN: Afful, RN, [name], Administrator, sports Primary Care Provider: Delight Stare Other Clinician: Referring Provider: Delight Stare Treating Provider/Extender: Frann Rider in Treatment: 1 Subjective Chief Complaint Information obtained from Patient Patients presents for treatment of an open diabetic ulcer with an arterial etiology on her right lateral ankle and right gluteal area for about 4 months History of Present Illness (HPI) The following HPI elements were documented for the patient's wound: Location: right lateral ankle and right gluteal area Quality: Patient reports experiencing a sharp pain to affected area(s). Severity: Patient states wound are getting worse. Duration: Patient has had the wound for > 4 months prior to seeking treatment at the wound center Timing: Pain in wound is constant (hurts all the time) Context: The wound occurred when the patient was in hospital with a necrotizing fasciitis of the right gluteal area and a ulceration on the right ankle Modifying Factors: Other treatment(s) tried include:admitted to the  hospital for IV antibiotics and a full workup and has also had a recent angiogram Associated Signs and Symptoms: Patient reports having increase discharge. 51 year old patient was sent to [place] from [facility] where she was seen by Dr. Sherril Cong for a left ankle ulceration and was recently hospitalized with hypotension and sepsis. She was treated with IV antibiotics and has been scheduled to see Dr. Sharol Given. She was seen by vascular surgery who recommended a femoral bypass but surgery has been delayed until her sacral wound from last year's necrotizing fasciitis has healed. Was seeing the wound care team at [facility] but wanted to change over. She is a smoker and smokes a pack of cigarettes a day The patient was recently admitted in [place] between  February 2 and February 14. She had a follow- up to see vascular surgery, orthopedic surgery and infectious disease. 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 Review of the electronic medical records reveals that Dr. 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 An MRI showed edema in the bone which was consistent with osteomyelitis and the chronic ulcer and may need surgical intervention. She also had a sacral decubitus ulcer which was treated with the wound VAC. On 01/07/2017 she was taken up by the vascular surgeon Dr. Trula Slade for an abdominal aortogram and bilateral lower extremity runoff, for a history of having bilateral femoropopliteal bypass graft as well as external iliac stenting on the right and stenting of her bypass graft. She had developed a nonhealing wound on her right ankle and there was a possibility of a femoral occlusion. the findings were noted and the [name], [name]. ([id number]) impression was a surgical revascularization with a aorto bifemoral bypass graft. Both the femoropopliteal bypass grafts were patent. 01/31/2017 -- x-ray of the right hip and pelvis -- IMPRESSION:No radiographic evidence of acute osteomyelitis. Normal-appearing right hip joint space for age. No acute bony abnormality of the hip. 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 Since seeing her last week she has not had an appointment with infectious disease or the orthopedic specialist  yet Objective Constitutional Pulse regular. Respirations normal and unlabored. Afebrile. Vitals Time Taken: 2:09 PM, Height: 67 in, Weight: 137 lbs, BMI: 21.5, Temperature: 97.7 F, Pulse: 105 bpm, Respiratory Rate: 18 breaths/min, Blood Pressure: 65/37 mmHg. General Notes: patient say she feels fine and is about time she takes her midodrine. She also said her cardiologist said "we are having false readings because of the blockage in her arms" Eyes Nonicteric. Reactive to light. Ears, Nose, Mouth, and Throat Lips, teeth, and gums WNL.Marland Kitchen Moist mucosa without lesions. Neck supple and nontender. No palpable supraclavicular or cervical adenopathy. Normal sized without goiter. Respiratory WNL. No retractions.. Cardiovascular Pedal Pulses WNL. No clubbing, cyanosis or edema. Lymphatic No adneopathy. No adenopathy. No adenopathy. Musculoskeletal Adexa without tenderness or enlargement.. Digits and nails w/o clubbing, cyanosis, infection, petechiae, ischemia, or inflammatory conditions.Marland Kitchen Psychiatric Judgement and insight Intact.. No evidence of depression, anxiety, or agitation.Marland Kitchen [name], [name] ([id number]) General Notes: the right ankle wound needed sharp debridement with #3  curet and a lot of the necrotic debris was removed. The right gluteal wound looks excellent and does not have any debris to be removed today. Integumentary (Hair, Skin) No suspicious lesions. No crepitus or fluctuance. No peri-wound warmth or erythema. No masses.. Wound #1 status is Open. Original cause of wound was Gradually Appeared. The wound is located on the Right,Lateral Malleolus. The wound measures 2cm length x 2.5cm width x 0.5cm depth; 3.927cm^2 area and 1.963cm^3 volume. The wound is limited to skin breakdown. There is no tunneling noted, however, there is undermining starting at 12:00 and ending at 12:00 with a maximum distance of 2cm. There is a large amount of serosanguineous drainage noted. The wound  margin is flat and intact. There is no granulation within the wound bed. There is a large (67-100%) amount of necrotic tissue within the wound bed including Adherent Slough. The periwound skin appearance exhibited: Maceration. The periwound skin appearance did not exhibit: Callus, Crepitus, Excoriation, Induration, Rash, Scarring, Dry/Scaly, Atrophie Blanche, Cyanosis, Ecchymosis, Hemosiderin Staining, Mottled, Pallor, Rubor, Erythema. Periwound temperature was noted as No Abnormality. The periwound has tenderness on palpation. Wound #2 status is Open. Original cause of wound was Gradually Appeared. The wound is located on the Right Gluteus. The wound measures 6cm length x 3cm width x 4.8cm depth; 14.137cm^2 area and 67.858cm^3 volume. There is Fat Layer (Subcutaneous Tissue) Exposed exposed. There is no tunneling or undermining noted. There is a large amount of serosanguineous drainage noted. The wound margin is distinct with the outline attached to the wound base. There is medium (34-66%) pink, pale granulation within the wound bed. There is a small (1-33%) amount of necrotic tissue within the wound bed including Adherent Slough. The periwound skin appearance exhibited: Induration. The periwound skin appearance did not exhibit: Callus, Crepitus, Excoriation, Rash, Scarring, Dry/Scaly, Maceration, Atrophie Blanche, Cyanosis, Ecchymosis, Hemosiderin Staining, Mottled, Pallor, Rubor, Erythema. Periwound temperature was noted as No Abnormality. The periwound has tenderness on palpation. Assessment Active Problems ICD-10 E11.621 - Type 2 diabetes mellitus with foot ulcer L89.314 - Pressure ulcer of right buttock, stage 4 L97.312 - Non-pressure chronic ulcer of right ankle with fat layer exposed F17.218 - Nicotine dependence, cigarettes, with other nicotine-induced disorders I70.233 - Atherosclerosis of native arteries of right leg with ulceration of ankle M86.371 - Chronic multifocal  osteomyelitis, right ankle and foot [name], [name] ([id number]) Procedures Wound #1 Wound #1 is a Diabetic Wound/Ulcer of the Lower Extremity located on the Right,Lateral Malleolus . There was a Skin/Subcutaneous Tissue Debridement ([id number]) debridement with total area of 5 sq cm performed by Christin Fudge, MD. with the following instrument(s): Curette to remove Non-Viable tissue/material including Fibrin/Slough and Subcutaneous after achieving pain control using Lidocaine 4% Topical Solution. A time out was conducted at 14:33, prior to the start of the procedure. A Minimum amount of bleeding was controlled with Pressure. The procedure was tolerated well with a pain level of 0 throughout and a pain level of 0 following the procedure. Post Debridement Measurements: 2cm length x 2.5cm width x 0.5cm depth; 1.963cm^3 volume. Character of Wound/Ulcer Post Debridement requires further debridement. Severity of Tissue Post Debridement is: Fat layer exposed. Post procedure Diagnosis Wound #1: Same as Pre-Procedure Plan Wound Cleansing: Wound #1 Right,Lateral Malleolus: Clean wound with Normal Saline. Wound #2 Right Gluteus: Clean wound with Normal Saline. Anesthetic: Wound #1 Right,Lateral Malleolus: Topical Lidocaine 4% cream applied to wound bed prior to debridement Wound #2 Right Gluteus: Topical Lidocaine 4% cream applied to wound bed prior to debridement  Skin Barriers/Peri-Wound Care: Wound #1 Right,Lateral Malleolus: Skin Prep Wound #2 Right Gluteus: Skin Prep Primary Wound Dressing: Wound #1 Right,Lateral Malleolus: Santyl Ointment - under wound vac Wound #2 Right Gluteus: Santyl Ointment - under wound vac Secondary Dressing: Wound #1 Right,Lateral Malleolus: Dry Gauze Boardered Foam Dressing Dressing Change Frequency: Wound #1 Right,Lateral Malleolus: Change dressing every day. [name], [name] ([id number]) Wound #2 Right Gluteus: Change Dressing Monday, Wednesday,  Friday Follow-up Appointments: Wound #1 Right,Lateral Malleolus: Return Appointment in 1 week. Wound #2 Right Gluteus: Return Appointment in 1 week. Additional Orders / Instructions: Wound #1 Right,Lateral Malleolus: Stop Smoking Increase protein intake. Activity as tolerated Other: - Vit C, A, Zinc Wound #2 Right Gluteus: Stop Smoking Increase protein intake. Activity as tolerated Other: - Vit C, A, Zinc Home Health: Wound #1 Right,Lateral Malleolus: [place] Visits - [place] Nurse may visit PRN to address patient s wound care needs. FACE TO FACE ENCOUNTER: MEDICARE and MEDICAID PATIENTS: [name] certify that this patient is under my care and that [name] had a face-to-face encounter that meets the physician face-to-face encounter requirements with this patient on this date. The encounter with the patient was in whole or in part for the following MEDICAL CONDITION: (primary reason for [place]) MEDICAL NECESSITY: [name] certify, that based on my findings, NURSING services are a medically necessary home health service. HOME BOUND STATUS: [name] certify that my clinical findings support that this patient is homebound ([name].e., Due to illness or injury, pt requires aid of supportive devices such as crutches, cane, wheelchairs, walkers, the use of special transportation or the assistance of another person to leave their place of residence. There is a normal inability to leave the home and doing so requires considerable and taxing effort. Other absences are for medical reasons / religious services and are infrequent or of short duration when for other reasons). If current dressing causes regression in wound condition, may D/C ordered dressing product/s and apply Normal Saline Moist Dressing daily until next [place] / Other MD appointment. [place] of regression in wound condition at [phone]. Please direct any NON-WOUND related  issues/requests for orders to patient's Primary Care Physician Wound #2 Right Gluteus: [place] Nurse may visit PRN to address patient s wound care needs. FACE TO FACE ENCOUNTER: MEDICARE and MEDICAID PATIENTS: [name] certify that this patient is under my care and that [name] had a face-to-face encounter that meets the physician face-to-face encounter requirements with this patient on this date. The encounter with the patient was in whole or in part for the following MEDICAL CONDITION: (primary reason for [place]) MEDICAL NECESSITY: [name] certify, that based on my findings, NURSING services are a medically necessary home health service. HOME BOUND STATUS: [name] certify that my clinical findings support that this patient is homebound ([name].e., Due to illness or injury, pt requires aid of supportive devices such as crutches, cane, wheelchairs, walkers, the use of special transportation or the assistance of another person to leave their place of residence. There is a normal inability to leave the home and doing so requires considerable and taxing effort. Other absences are for medical reasons / religious services and are infrequent or of short duration when for other reasons). If current dressing causes regression in wound condition, may D/C ordered dressing product/s and apply Normal Saline Moist Dressing daily until next [place] / Other MD appointment. Notify Wound Mallinger, Vernie S. (  [id number]) [place] of regression in wound condition at [phone]. Please direct any NON-WOUND related issues/requests for orders to patient's Primary Care Physician Negative Pressure Wound Therapy: Wound #2 Right Gluteus: Wound VAC settings at 125/130 mmHg continuous pressure. Use BLACK/GREEN foam to wound cavity. Use WHITE foam to fill any tunnel/s and/or undermining. Change VAC dressing 3 X WEEK. Change canister as indicated when full. Nurse may  titrate settings and frequency of dressing changes as clinically indicated. Medications-please add to medication list.: Wound #1 Right,Lateral Malleolus: Santyl Enzymatic Ointment Wound #2 Right Gluteus: Santyl Enzymatic Ointment After review have recommended: 1. Santyl ointment with a packing to be applied to the right ankle, change daily 2. Santyl ointment to be applied under the foam of the wound VAC, to the right gluteal region to be changed 3 times a week. 3. X-ray of the right ischial tuberosity -- no osteomyelitis seen on plain film 4. Follow-up with Dr. Sharol Given regarding the right fibula bone biopsy. appointment pending 5. Follow up with infectious disease regarding long-term antibiotics. appointment pending 6. need to completely give up smoking and have discussed the risks benefits alternatives and the importance 7. adequate protein, vitamin A, vitamin C and zinc 8. arterial surgery has been put off till her other issues are resolved She and husband have read all questions answered and will be compliant Electronic Signature(s) Signed: 01/31/2017 2:50:40 PM By: Christin Fudge MD, FACS Entered By: Christin Fudge on 01/31/2017 14:50:40 Kinnard, [name] ([id number]) -------------------------------------------------------------------------------- SuperBill Details Patient Name: [name]. Date of Service: 01/31/2017 Medical Record Number: [id number] Patient Account Number: [id number] Date of Birth/Sex: 1965-12-18 (51 y.o. 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, right ankle and foot Facility Procedures CPT4 Code Description: 94503888 11042 - DEB SUBQ TISSUE 20 SQ CM/< ICD-10 Description Diagnosis E11.621 Type 2 diabetes mellitus with foot ulcer L97.312 Non-pressure chronic ulcer of right ankle with fat l Modifier: ayer exposed Quantity: 1 Physician Procedures CPT4 Code Description: [id number] 99213 - WC PHYS LEVEL 3 - EST PT ICD-10 Description Diagnosis E11.621 Type 2 diabetes mellitus with foot ulcer L97.312 Non-pressure chronic ulcer of right ankle with fat l L89.314 Pressure ulcer of right buttock, stage 4 Modifier: 25 ayer exposed Quantity: 1 CPT4 Code Description: 1791505 11042 - WC PHYS SUBQ TISS 20 SQ CM ICD-10 Description Diagnosis E11.621 Type 2 diabetes mellitus with foot ulcer L97.312 Non-pressure chronic ulcer of right ankle with fat l Holzman, Tattiana S. ([id number]) Modifier: ayer exposed Quantity: 1 Electronic Signature(s) Signed: 01/31/2017 2:51:02 PM By: Christin Fudge MD, FACS Entered By: Christin Fudge on 01/31/2017 14:51:02

## 2017-02-01 NOTE — Progress Notes (Signed)
KORINA, TRETTER ([id number]) Visit Report for 01/31/2017 Arrival Information Details Patient Name: [name], [name]. Date of Service: 01/31/2017 2:15 PM Medical Record Number: [id number] Patient Account Number: [id number] Date of Birth/Sex: 1966-03-11 (52 y.o. Female) Treating RN: Afful, RN, [name], Velva Harman Primary Care Ebin Palazzi: Delight Stare Other Clinician: Referring Kista Robb: Delight Stare Treating Hanifa Antonetti/Extender: Frann Rider in Treatment: 1 Visit Information History Since Last Visit All ordered tests and consults were completed: No Patient Arrived: Ambulatory Added or deleted any medications: No Arrival Time: 14:07 Any new allergies or adverse reactions: No Accompanied By: hubby Had a fall or experienced change in No Transfer Assistance: None activities of daily living that may affect Patient Identification Verified: Yes risk of falls: Secondary Verification Process Yes Signs or symptoms of abuse/neglect since last No Completed: visito Patient Requires Transmission-Based No Hospitalized since last visit: No Precautions: Has Dressing in Place as Prescribed: Yes Patient Has Alerts: No Pain Present Now: No Electronic Signature(s) Signed: 01/31/2017 4:40:53 PM By: Regan Lemming [name], RN Entered By: Regan Lemming on 01/31/2017 14:08:08 Goding, [name] ([id number]) -------------------------------------------------------------------------------- Encounter Discharge Information Details Patient Name: [name]. Date of Service: 01/31/2017 2:15 PM Medical Record Number: [id number] Patient Account Number: [id number] Date of Birth/Sex: 02-28-1966 (51 y.o. Female) Treating RN: Afful, RN, [name], Velva Harman Primary Care Jasdeep Dejarnett: Delight Stare Other Clinician: Referring Jarius Dieudonne: Delight Stare Treating Shilo Pauwels/Extender: Frann Rider in Treatment: 1 Encounter Discharge Information Items Discharge Pain Level: 0 Discharge Condition: Stable Ambulatory Status: Walker Discharge  Destination: Home Transportation: Private Auto Accompanied By: husband Schedule Follow-up Appointment: No Medication Reconciliation completed No and provided to Patient/Care Hailie Searight: Provided on Clinical Summary of Care: 01/31/2017 Form Type Recipient Paper Patient RM Electronic Signature(s) Signed: 01/31/2017 2:46:38 PM By: Ruthine Dose Entered By: Ruthine Dose on 01/31/2017 14:46:38 [name], [name] ([id number]) -------------------------------------------------------------------------------- Lower Extremity Assessment Details Patient Name: [name]. Date of Service: 01/31/2017 2:15 PM Medical Record Number: [id number] Patient Account Number: [id number] Date of Birth/Sex: Nov 30, 1965 (51 y.o. Female) Treating RN: Afful, RN, [name], [employer] Primary Care Shawnta Zimbelman: Delight Stare Other Clinician: Referring Nadiah Corbit: Delight Stare Treating Silvestre Mines/Extender: Frann Rider in Treatment: 1 Edema Assessment Assessed: [Left: No] [Right: No] E[Left: dema] [Right: :] Calf Left: Right: Point of Measurement: 37 cm From Medial Instep cm cm Ankle Left: Right: Point of Measurement: 9 cm From Medial Instep cm cm Vascular Assessment Claudication: Claudication Assessment [Right:None] Pulses: Dorsalis Pedis Palpable: [Right:Yes] Posterior Tibial Extremity colors, hair growth, and conditions: Extremity Color: [Right:Normal] Hair Growth on Extremity: [Right:No] Temperature of Extremity: [Right:Warm] Capillary Refill: [Right:< 3 seconds] Electronic Signature(s) Signed: 01/31/2017 4:40:53 PM By: Regan Lemming [name], RN Entered By: Regan Lemming on 01/31/2017 14:11:26 [name], Chesley S. ([id number]) -------------------------------------------------------------------------------- Multi Wound Chart Details Patient Name: [name]. Date of Service: 01/31/2017 2:15 PM Medical Record Number: [id number] Patient Account Number: [id number] Date of Birth/Sex: 1966/04/07 (51 y.o. Female) Treating  RN: Baruch Gouty, RN, [name], Velva Harman Primary Care Lyndall Bellot: Delight Stare Other Clinician: Referring Ohana Birdwell: Delight Stare Treating Rohnan Bartleson/Extender: Frann Rider in Treatment: 1 Vital Signs Height(in): 67 Pulse(bpm): 105 Weight(lbs): 137 Blood Pressure 65/37 (mmHg): Body Mass Index(BMI): 21 Temperature(F): 97.7 Respiratory Rate 18 (breaths/min): Photos: [1:No Photos] [2:No Photos] [N/A:N/A] Wound Location: [1:Right Malleolus - Lateral] [2:Right Gluteus] [N/A:N/A] Wounding Event: [1:Gradually Appeared] [2:Gradually Appeared] [N/A:N/A] Primary Etiology: [1:Diabetic Wound/Ulcer of the Lower Extremity] [2:Open Surgical Wound] [N/A:N/A] Comorbid History: [1:Anemia, Arrhythmia, Coronary Artery Disease, Hypotension, Peripheral Arterial Disease, Type II Diabetes, Osteoarthritis] [2:Anemia, Arrhythmia, Coronary Artery Disease, Hypotension, Peripheral Arterial Disease, Type II  Diabetes,  Osteoarthritis] [N/A:N/A] Date Acquired: [1:09/23/2016] [2:09/18/2016] [N/A:N/A] Weeks of Treatment: [1:1] [2:1] [N/A:N/A] Wound Status: [1:Open] [2:Open] [N/A:N/A] Measurements L x W x D 2x2.5x0.5 [2:6x3x4.8] [N/A:N/A] (cm) Area (cm) : [1:3.927] [2:14.137] [N/A:N/A] Volume (cm) : [1:1.963] [2:37.628] [N/A:N/A] % Reduction in Area: [1:0.00%] [2:26.50%] [N/A:N/A] % Reduction in Volume: 0.00% [2:29.40%] [N/A:N/A] Starting Position 1 12 (o'clock): Ending Position 1 [1:12] (o'clock): Maximum Distance 1 2 (cm): Undermining: [1:Yes] [2:No] [N/A:N/A] Classification: [1:Grade 2] [2:Full Thickness Without Exposed Support Structures] [N/A:N/A] Exudate Amount: [1:Large] [2:Large] [N/A:N/A] Exudate Type: Serosanguineous Serosanguineous N/A Exudate Color: red, brown red, brown N/A Wound Margin: Flat and Intact Distinct, outline attached N/A Granulation Amount: None Present (0%) Medium (34-66%) N/A Granulation Quality: N/A Pink, Pale N/A Necrotic Amount: Large (67-100%) Small (1-33%) N/A Exposed  Structures: Fascia: No Fat Layer (Subcutaneous N/A Fat Layer (Subcutaneous Tissue) Exposed: Yes Tissue) Exposed: No Fascia: No Tendon: No Tendon: No Muscle: No Muscle: No Joint: No Joint: No Bone: No Bone: No Limited to Skin Breakdown Epithelialization: None None N/A Debridement: Debridement (31517- N/A N/A 11047) Pre-procedure 14:33 N/A N/A Verification/Time Out Taken: Pain Control: Lidocaine 4% Topical N/A N/A Solution Tissue Debrided: Fibrin/Slough, N/A N/A Subcutaneous Level: Skin/Subcutaneous N/A N/A Tissue Debridement Area (sq 5 N/A N/A cm): Instrument: Curette N/A N/A Bleeding: Minimum N/A N/A Hemostasis Achieved: Pressure N/A N/A Procedural Pain: 0 N/A N/A Post Procedural Pain: 0 N/A N/A Debridement Treatment Procedure was tolerated N/A N/A Response: well Post Debridement 2x2.5x0.5 N/A N/A Measurements L x W x D (cm) Post Debridement 1.963 N/A N/A Volume: (cm) Periwound Skin Texture: Excoriation: No Induration: Yes N/A Induration: No Excoriation: No Callus: No Callus: No Crepitus: No Crepitus: No Rash: No Rash: No Scarring: No Scarring: No Periwound Skin Maceration: Yes Maceration: No N/A Moisture: Dry/Scaly: No Dry/Scaly: No Periwound Skin Color: N/A Mach, Jarrett S. ([id number]) Atrophie Blanche: No Atrophie Blanche: No Cyanosis: No Cyanosis: No Ecchymosis: No Ecchymosis: No Erythema: No Erythema: No Hemosiderin Staining: No Hemosiderin Staining: No Mottled: No Mottled: No Pallor: No Pallor: No Rubor: No Rubor: No Temperature: No Abnormality No Abnormality N/A Tenderness on Yes Yes N/A Palpation: Wound Preparation: Ulcer Cleansing: Ulcer Cleansing: N/A Rinsed/Irrigated with Rinsed/Irrigated with Saline Saline Topical Anesthetic Topical Anesthetic Applied: Other: Lidocaine Applied: Other: lidocaine 4% 4% Procedures Performed: Debridement N/A N/A Treatment Notes Wound #1 (Right, Lateral Malleolus) 1. Cleansed  with: Clean wound with Normal Saline 3. Peri-wound Care: Skin Prep 4. Dressing Applied: Santyl Ointment 5. Secondary Dressing Applied Bordered Foam Dressing Dry Gauze 7. Secured with Tape Wound #2 (Right Gluteus) 1. Cleansed with: Clean wound with Normal Saline 3. Peri-wound Care: Skin Prep 5. Secondary Dressing Applied Bordered Foam Dressing Dry Gauze Saline moistened guaze Electronic Signature(s) Signed: 01/31/2017 2:47:43 PM By: Christin Fudge MD, FACS ANWAR, Lorielle SMarland Kitchen ([id number]) Entered By: Christin Fudge on 01/31/2017 14:47:42 Sligar, [name] ([id number]) -------------------------------------------------------------------------------- [place] Details Patient Name: [name], [name]. Date of Service: 01/31/2017 2:15 PM Medical Record Number: [id number] Patient Account Number: [id number] Date of Birth/Sex: Mar 18, 1966 (51 y.o. Female) Treating RN: Afful, RN, [name], Velva Harman Primary Care Albaraa Swingle: Delight Stare Other Clinician: Referring Benjamyn Hestand: Delight Stare Treating Donella Pascarella/Extender: Frann Rider in Treatment: 1 Active Inactive ` Orientation to the Wound Care Program Nursing Diagnoses: Knowledge deficit related to the wound healing center program Goals: Patient/caregiver will verbalize understanding of the [facility] Program Date Initiated: 01/23/2017 Target Resolution Date: 05/25/2017 Goal Status: Active Interventions: Provide education on orientation to the wound center Notes: ` Peripheral Neuropathy Nursing Diagnoses: Knowledge  deficit related to disease process and management of peripheral neurovascular dysfunction Potential alteration in peripheral tissue perfusion (select prior to confirmation of diagnosis) Goals: Patient/caregiver will verbalize understanding of disease process and disease management Date Initiated: 01/23/2017 Target Resolution Date: 05/25/2017 Goal Status: Active Interventions: Assess signs and symptoms of  neuropathy upon admission and as needed Provide education on Management of Neuropathy and Related Ulcers Provide education on Management of Neuropathy upon discharge from the [place]: Test ordered outside of clinic : 01/23/2017 Notes: [name], [name] ([id number]) ` Pressure Nursing Diagnoses: Knowledge deficit related to causes and risk factors for pressure ulcer development Knowledge deficit related to management of pressures ulcers Potential for impaired tissue integrity related to pressure, friction, moisture, and shear Goals: Patient will remain free from development of additional pressure ulcers Date Initiated: 01/23/2017 Target Resolution Date: 05/25/2017 Goal Status: Active Patient will remain free of pressure ulcers Date Initiated: 01/23/2017 Target Resolution Date: 05/25/2017 Goal Status: Active Patient/caregiver will verbalize risk factors for pressure ulcer development Date Initiated: 01/23/2017 Target Resolution Date: 05/25/2017 Goal Status: Active Patient/caregiver will verbalize understanding of pressure ulcer management Date Initiated: 01/23/2017 Target Resolution Date: 05/25/2017 Goal Status: Active Interventions: Assess: immobility, friction, shearing, incontinence upon admission and as needed Assess offloading mechanisms upon admission and as needed Assess potential for pressure ulcer upon admission and as needed Provide education on pressure ulcers Treatment Activities: Patient referred for pressure reduction/relief devices : 01/23/2017 Patient referred for seating evaluation to ensure proper offloading : 01/23/2017 Pressure reduction/relief device ordered : 01/23/2017 Notes: ` Wound/Skin Impairment Nursing Diagnoses: Impaired tissue integrity Knowledge deficit related to smoking impact on wound healing Knowledge deficit related to ulceration/compromised skin integrity Goals: Patient/caregiver will verbalize understanding of skin care  regimen [name], [name] ([id number]) Date Initiated: 01/23/2017 Target Resolution Date: 05/25/2017 Goal Status: Active Ulcer/skin breakdown will have a volume reduction of 30% by week 4 Date Initiated: 01/23/2017 Target Resolution Date: 05/25/2017 Goal Status: Active Ulcer/skin breakdown will have a volume reduction of 50% by week 8 Date Initiated: 01/23/2017 Target Resolution Date: 05/25/2017 Goal Status: Active Ulcer/skin breakdown will have a volume reduction of 80% by week 12 Date Initiated: 01/23/2017 Target Resolution Date: 05/25/2017 Goal Status: Active Ulcer/skin breakdown will heal within 14 weeks Date Initiated: 01/23/2017 Target Resolution Date: 05/25/2017 Goal Status: Active Interventions: Assess patient/caregiver ability to obtain necessary supplies Assess patient/caregiver ability to perform ulcer/skin care regimen upon admission and as needed Assess ulceration(s) every visit Provide education on smoking Provide education on ulcer and skin care Treatment Activities: Patient referred to home care : 01/23/2017 Referred to DME Jerald Hennington for dressing supplies : 01/23/2017 Skin care regimen initiated : 01/23/2017 Topical wound management initiated : 01/23/2017 Notes: Electronic Signature(s) Signed: 01/31/2017 4:40:53 PM By: Regan Lemming [name], RN Entered By: Regan Lemming on 01/31/2017 14:25:56 Milhoan, Bette Chauncey [name] ([id number]) -------------------------------------------------------------------------------- Pain Assessment Details Patient Name: [name]. Date of Service: 01/31/2017 2:15 PM Medical Record Number: [id number] Patient Account Number: [id number] Date of Birth/Sex: December 14, 1965 (51 y.o. Female) Treating RN: Afful, RN, [name], Velva Harman Primary Care Shelvy Perazzo: Delight Stare Other Clinician: Referring Megahn Killings: Delight Stare Treating Arkie Tagliaferro/Extender: Frann Rider in Treatment: 1 Active Problems Location of Pain Severity and Description of Pain Patient Has Paino Yes Site Locations With  Dressing Change: Yes Character of Pain Describe the Pain: Aching, Tender Pain Management and Medication Current Pain Management: How does your wound impact your activities of daily livingo Sleep: Yes Bathing: Yes Appetite: Yes Relationship With Others: Yes Bladder Continence: Yes Emotions:  Yes Bowel Continence: Yes Work: Yes Toileting: Yes Drive: Yes Dressing: Yes Hobbies: Astronomer) Signed: 01/31/2017 4:40:53 PM By: Regan Lemming [name], RN Entered By: Regan Lemming on 01/31/2017 14:08:27 Ederer, [name] ([id number]) -------------------------------------------------------------------------------- Patient/Caregiver Education Details Patient Name: [name]. Date of Service: 01/31/2017 2:15 PM Medical Record Number: [id number] Patient Account Number: [id number] Date of Birth/Gender: [birth year] (51 y.o. Female) Treating RN: Afful, RN, [name], Velva Harman Primary Care Physician: Delight Stare Other Clinician: Referring Physician: Delight Stare Treating Physician/Extender: Frann Rider in Treatment: 1 Education Assessment Education Provided To: Patient Education Topics Provided Peripheral Neuropathy: Methods: Explain/Verbal Responses: State content correctly Pressure: Methods: Explain/Verbal Responses: State content correctly Smoking and Wound Healing: Methods: Explain/Verbal Responses: State content correctly Welcome To The [place]: Methods: Explain/Verbal Responses: State content correctly Wound/Skin Impairment: Methods: Explain/Verbal Responses: State content correctly Electronic Signature(s) Signed: 01/31/2017 4:40:53 PM By: Regan Lemming [name], RN Entered By: Regan Lemming on 01/31/2017 14:45:54 [place], Felma S. ([id number]) -------------------------------------------------------------------------------- Wound Assessment Details Patient Name: [name]. Date of Service: 01/31/2017 2:15 PM Medical Record Number: [id number] Patient Account  Number: [id number] Date of Birth/Sex: 09-15-1966 (51 y.o. Female) Treating RN: Afful, RN, [name], [employer] Primary Care Bobbi Yount: Delight Stare Other Clinician: Referring Clevester Helzer: Delight Stare Treating Adley Castello/Extender: Frann Rider in Treatment: 1 Wound Status Wound Number: 1 Primary Diabetic Wound/Ulcer of the Lower Etiology: Extremity Wound Location: Right Malleolus - Lateral Wound Open Wounding Event: Gradually Appeared Status: Date Acquired: 09/23/2016 Comorbid Anemia, Arrhythmia, Coronary Artery Weeks Of Treatment: 1 History: Disease, Hypotension, Peripheral Clustered Wound: No Arterial Disease, Type II Diabetes, Osteoarthritis Photos Photo Uploaded By: Regan Lemming on 01/31/2017 16:03:28 Wound Measurements Length: (cm) 2 Width: (cm) 2.5 Depth: (cm) 0.5 Area: (cm) 3.927 Volume: (cm) 1.963 % Reduction in Area: 0% % Reduction in Volume: 0% Epithelialization: None Tunneling: No Undermining: Yes Starting Position (o'clock): 12 Ending Position (o'clock): 12 Maximum Distance: (cm) 2 Wound Description Classification: Grade 2 Wound Margin: Flat and Intact Exudate Amount: Large Exudate Type: Serosanguineous Exudate Color: red, brown Bacci, Sylver S. ([id number]) Foul Odor After Cleansing: No Slough/Fibrino Yes Wound Bed Granulation Amount: None Present (0%) Exposed Structure Necrotic Amount: Large (67-100%) Fascia Exposed: No Necrotic Quality: Adherent Slough Fat Layer (Subcutaneous Tissue) Exposed: No Tendon Exposed: No Muscle Exposed: No Joint Exposed: No Bone Exposed: No Limited to Skin Breakdown Periwound Skin Texture Texture Color No Abnormalities Noted: No No Abnormalities Noted: No Callus: No Atrophie Blanche: No Crepitus: No Cyanosis: No Excoriation: No Ecchymosis: No Induration: No Erythema: No Rash: No Hemosiderin Staining: No Scarring: No Mottled: No Pallor: No Moisture Rubor: No No Abnormalities Noted: No Dry / Scaly: No  Temperature / Pain Maceration: Yes Temperature: No Abnormality Tenderness on Palpation: Yes Wound Preparation Ulcer Cleansing: Rinsed/Irrigated with Saline Topical Anesthetic Applied: Other: Lidocaine 4%, Treatment Notes Wound #1 (Right, Lateral Malleolus) 1. Cleansed with: Clean wound with Normal Saline 3. Peri-wound Care: Skin Prep 4. Dressing Applied: Santyl Ointment 5. Secondary Dressing Applied Bordered Foam Dressing Dry Gauze 7. Secured with Recruitment consultant) Signed: 01/31/2017 4:40:53 PM By: Regan Lemming [name], RN Entered By: Regan Lemming on 01/31/2017 14:17:25 [name], [name] ([id number]) [name], [name] ([id number]) -------------------------------------------------------------------------------- Wound Assessment Details Patient Name: [name], [name] Date of Service: 01/31/2017 2:15 PM Medical Record Number: [id number] Patient Account Number: [id number] Date of Birth/Sex: 03-13-1966 (51 y.o. Female) Treating RN: Afful, RN, [name], [employer] Primary Care Adreana Coull: Delight Stare Other Clinician: Referring Nikola Blackston: Delight Stare Treating Oleva Koo/Extender: Frann Rider in Treatment: 1 Wound Status  Wound Number: 2 Primary Open Surgical Wound Etiology: Wound Location: Right Gluteus Wound Open Wounding Event: Gradually Appeared Status: Date Acquired: 09/18/2016 Comorbid Anemia, Arrhythmia, Coronary Artery Weeks Of Treatment: 1 History: Disease, Hypotension, Peripheral Clustered Wound: No Arterial Disease, Type II Diabetes, Osteoarthritis Photos Photo Uploaded By: Regan Lemming on 01/31/2017 16:03:28 Wound Measurements Length: (cm) 6 Width: (cm) 3 Depth: (cm) 4.8 Area: (cm) 14.137 Volume: (cm) 67.858 % Reduction in Area: 26.5% % Reduction in Volume: 29.4% Epithelialization: None Tunneling: No Undermining: No Wound Description Full Thickness Without Exposed Classification: Support Structures Wound Margin: Distinct, outline  attached Exudate Large Amount: Exudate Type: Serosanguineous Exudate Color: red, brown Foul Odor After Cleansing: No Slough/Fibrino Yes Wound Bed Granulation Amount: Medium (34-66%) Exposed Structure [name], [name] ([id number]) Granulation Quality: Pink, Pale Fascia Exposed: No Necrotic Amount: Small (1-33%) Fat Layer (Subcutaneous Tissue) Exposed: Yes Necrotic Quality: Adherent Slough Tendon Exposed: No Muscle Exposed: No Joint Exposed: No Bone Exposed: No Periwound Skin Texture Texture Color No Abnormalities Noted: No No Abnormalities Noted: No Callus: No Atrophie Blanche: No Crepitus: No Cyanosis: No Excoriation: No Ecchymosis: No Induration: Yes Erythema: No Rash: No Hemosiderin Staining: No Scarring: No Mottled: No Pallor: No Moisture Rubor: No No Abnormalities Noted: No Dry / Scaly: No Temperature / Pain Maceration: No Temperature: No Abnormality Tenderness on Palpation: Yes Wound Preparation Ulcer Cleansing: Rinsed/Irrigated with Saline Topical Anesthetic Applied: Other: lidocaine 4%, Treatment Notes Wound #2 (Right Gluteus) 1. Cleansed with: Clean wound with Normal Saline 3. Peri-wound Care: Skin Prep 5. Secondary Dressing Applied Bordered Foam Dressing Dry Gauze Saline moistened guaze Electronic Signature(s) Signed: 01/31/2017 4:40:53 PM By: Regan Lemming [name], RN Entered By: Regan Lemming on 01/31/2017 14:25:15 [name], [name] ([id number]) -------------------------------------------------------------------------------- [place] Details Patient Name: [name]. Date of Service: 01/31/2017 2:15 PM Medical Record Number: [id number] Patient Account Number: [id number] Date of Birth/Sex: 29-Jan-1966 (51 y.o. Female) Treating RN: Afful, RN, [name], [employer] Primary Care Hali Balgobin: Delight Stare Other Clinician: Referring Jamarie [place]lin: Delight Stare Treating Alexey Rhoads/Extender: Frann Rider in Treatment: 1 Vital Signs Time Taken: 14:09 Temperature  (F): 97.7 Height (in): 67 Pulse (bpm): 105 Weight (lbs): 137 Respiratory Rate (breaths/min): 18 Body Mass Index (BMI): 21.5 Blood Pressure (mmHg): 65/37 Reference Range: 80 - 120 mg / dl Notes patient say she feels fine and is about time she takes her midodrine. She also said her cardiologist said "we are having false readings because of the blockage in her arms" Electronic Signature(s) Signed: 01/31/2017 4:40:53 PM By: Regan Lemming [name], RN Entered By: Regan Lemming on 01/31/2017 14:10:44

## 2017-02-03 ENCOUNTER — Encounter: Payer: Medicare Other | Admitting: Surgery

## 2017-02-05 ENCOUNTER — Ambulatory Visit (INDEPENDENT_AMBULATORY_CARE_PROVIDER_SITE_OTHER): Payer: Medicare Other | Admitting: Family

## 2017-02-05 ENCOUNTER — Encounter: Payer: Medicare Other | Admitting: Surgery

## 2017-02-06 ENCOUNTER — Encounter: Payer: Medicare Other | Admitting: Surgery

## 2017-02-06 DIAGNOSIS — E11621 Type 2 diabetes mellitus with foot ulcer: Secondary | ICD-10-CM | POA: Diagnosis not present

## 2017-02-07 ENCOUNTER — Telehealth: Payer: Self-pay | Admitting: Cardiology

## 2017-02-07 NOTE — Telephone Encounter (Signed)
Patient states that it has been over 9 years since she went through menopause. I will pass this information on to [facility] so they are aware.

## 2017-02-07 NOTE — Telephone Encounter (Signed)
Spoke with [name] in [facility] and she states that patient needs to be postmenopausal for greater than 2 years and she was unsuccessful reaching patient. Let her know that I would attempt to call her to confirm and if needed put in pregnancy test to be done prior to procedure. She was appreciative for the call and didn't need anything else at this time.

## 2017-02-07 NOTE — Telephone Encounter (Signed)
[facility] states they cannot see when pt last menstrual . She has tried to contact pt, but unable to. She wanted to let us know.

## 2017-02-07 NOTE — Telephone Encounter (Signed)
Spoke with Claiborne Billings in [facility] and let her know patients postmenopausal for 9 years. She was appreciative for the call back.

## 2017-02-08 NOTE — Progress Notes (Signed)
ADELA, ESTEBAN ([id number]) Visit Report for 02/06/2017 Arrival Information Details Patient Name: ROSHAN, SALAMON. Date of Service: 02/06/2017 8:00 AM Medical Record Number: [id number] Patient Account Number: [id number] Date of Birth/Sex: 12-12-1965 (51 y.o. Female) Treating RN: Carolyne Fiscal, Debi Primary Care Emonii Wienke: Delight Stare Other Clinician: Referring Saban Heinlen: Delight Stare Treating Sherissa Tenenbaum/Extender: Frann Rider in Treatment: 2 Visit Information History Since Last Visit All ordered tests and consults were completed: No Patient Arrived: Kasandra Knudsen Added or deleted any medications: No Arrival Time: 08:07 Any new allergies or adverse reactions: No Accompanied By: self Had a fall or experienced change in No Transfer Assistance: None activities of daily living that may affect Patient Requires Transmission-Based No risk of falls: Precautions: Signs or symptoms of abuse/neglect since last No Patient Has Alerts: No visito Hospitalized since last visit: No Has Dressing in Place as Prescribed: Yes Pain Present Now: Yes Electronic Signature(s) Signed: 02/07/2017 4:34:11 PM By: Alric Quan Entered By: Alric Quan on 02/06/2017 08:24:25 Eakes, Evalene S. ([id number]) -------------------------------------------------------------------------------- Encounter Discharge Information Details Patient Name: [name] Date of Service: 02/06/2017 8:00 AM Medical Record Number: [id number] Patient Account Number: [id number] Date of Birth/Sex: February 09, 1966 (51 y.o. Female) Treating RN: Carolyne Fiscal, Debi Primary Care Ethelreda Sukhu: Delight Stare Other Clinician: Referring Daxon Kyne: Delight Stare Treating Kejon Feild/Extender: Frann Rider in Treatment: 2 Encounter Discharge Information Items Discharge Pain Level: 6 Discharge Condition: Stable Ambulatory Status: Cane Discharge Destination: Home Transportation: Private Auto Accompanied By: self Schedule Follow-up Appointment:  Yes Medication Reconciliation completed No and provided to Patient/Care Ivone Licht: Provided on Clinical Summary of Care: 02/06/2017 Form Type Recipient Paper Patient RM Electronic Signature(s) Signed: 02/06/2017 8:48:35 AM By: Ruthine Dose Entered By: Ruthine Dose on 02/06/2017 08:48:35 Stencil, Natassja S. ([id number]) -------------------------------------------------------------------------------- Lower Extremity Assessment Details Patient Name: [name] Date of Service: 02/06/2017 8:00 AM Medical Record Number: [id number] Patient Account Number: [id number] Date of Birth/Sex: 24-Jun-1966 (51 y.o. Female) Treating RN: Carolyne Fiscal, Debi Primary Care Renan Danese: Delight Stare Other Clinician: Referring Catha Ontko: Delight Stare Treating Zayne Marovich/Extender: Frann Rider in Treatment: 2 Vascular Assessment Pulses: Dorsalis Pedis Palpable: [Right:Yes] Posterior Tibial Extremity colors, hair growth, and conditions: Extremity Color: [Right:Normal] Temperature of Extremity: [Right:Warm] Capillary Refill: [Right:< 3 seconds] Electronic Signature(s) Signed: 02/07/2017 4:34:11 PM By: Alric Quan Entered By: Alric Quan on 02/06/2017 08:23:17 Melander, Dondra S. ([id number]) -------------------------------------------------------------------------------- Multi Wound Chart Details Patient Name: [name] Date of Service: 02/06/2017 8:00 AM Medical Record Number: [id number] Patient Account Number: [id number] Date of Birth/Sex: 1966/02/07 (51 y.o. Female) Treating RN: Carolyne Fiscal, Debi Primary Care Sitlaly Gudiel: Delight Stare Other Clinician: Referring Sanah Kraska: Delight Stare Treating Jaidin Ugarte/Extender: Frann Rider in Treatment: 2 Vital Signs Height(in): 67 Pulse(bpm): 102 Weight(lbs): 137 Blood Pressure 98/62 (mmHg): Body Mass Index(BMI): 21 Temperature(F): 98.0 Respiratory Rate 18 (breaths/min): Photos: [1:No Photos] [2:No Photos] [N/A:N/A] Wound Location:  [1:Right Malleolus - Lateral] [2:Right Gluteus] [N/A:N/A] Wounding Event: [1:Gradually Appeared] [2:Gradually Appeared] [N/A:N/A] Primary Etiology: [1:Diabetic Wound/Ulcer of the Lower Extremity] [2:Open Surgical Wound] [N/A:N/A] Comorbid History: [1:Anemia, Arrhythmia, Coronary Artery Disease, Hypotension, Peripheral Arterial Disease, Type II Diabetes, Osteoarthritis] [2:Anemia, Arrhythmia, Coronary Artery Disease, Hypotension, Peripheral Arterial Disease, Type II Diabetes,  Osteoarthritis] [N/A:N/A] Date Acquired: [1:09/23/2016] [2:09/18/2016] [N/A:N/A] Weeks of Treatment: [1:2] [2:2] [N/A:N/A] Wound Status: [1:Open] [2:Open] [N/A:N/A] Measurements L x W x D 1.6x2x0.3 [2:6x2.7x3.3] [N/A:N/A] (cm) Area (cm) : [1:2.513] [2:12.723] [N/A:N/A] Volume (cm) : [1:0.754] [2:41.987] [N/A:N/A] % Reduction in Area: [1:36.00%] [2:33.80%] [N/A:N/A] % Reduction in Volume: 61.60% [2:56.30%] [N/A:N/A] Starting Position 1 6 (o'clock): Ending Position 1 [1:9] (o'clock): Maximum  Distance 1 1 (cm): Undermining: [1:Yes] [2:No] [N/A:N/A] Classification: [1:Grade 2] [2:Full Thickness Without Exposed Support Structures] [N/A:N/A] Exudate Amount: [1:Large] [2:Large] [N/A:N/A] Exudate Type: Serosanguineous Serosanguineous N/A Exudate Color: red, brown red, brown N/A Wound Margin: Flat and Intact Distinct, outline attached N/A Granulation Amount: Small (1-33%) Large (67-100%) N/A Granulation Quality: Pink Pink, Pale N/A Necrotic Amount: Large (67-100%) Small (1-33%) N/A Exposed Structures: Fascia: No Fat Layer (Subcutaneous N/A Fat Layer (Subcutaneous Tissue) Exposed: Yes Tissue) Exposed: No Fascia: No Tendon: No Tendon: No Muscle: No Muscle: No Joint: No Joint: No Bone: No Bone: No Limited to Skin Breakdown Epithelialization: None None N/A Debridement: Debridement ([id number] N/A N/A 11047) Pre-procedure 08:30 N/A N/A Verification/Time Out Taken: Pain Control: Lidocaine 4% Topical N/A  N/A Solution Tissue Debrided: Fibrin/Slough, Exudates, N/A N/A Subcutaneous Level: Skin/Subcutaneous N/A N/A Tissue Debridement Area (sq 3.2 N/A N/A cm): Instrument: Curette N/A N/A Bleeding: Minimum N/A N/A Hemostasis Achieved: Pressure N/A N/A Procedural Pain: 0 N/A N/A Post Procedural Pain: 0 N/A N/A Debridement Treatment Procedure was tolerated N/A N/A Response: well Post Debridement 1.6x2x0.3 N/A N/A Measurements L x W x D (cm) Post Debridement 0.754 N/A N/A Volume: (cm) Periwound Skin Texture: Excoriation: No Induration: Yes N/A Induration: No Excoriation: No Callus: No Callus: No Crepitus: No Crepitus: No Rash: No Rash: No Scarring: No Scarring: No Periwound Skin Maceration: Yes Maceration: No N/A Moisture: Dry/Scaly: No Dry/Scaly: No Periwound Skin Color: N/A Basque, Alline S. ([id number]) Atrophie Blanche: No Atrophie Blanche: No Cyanosis: No Cyanosis: No Ecchymosis: No Ecchymosis: No Erythema: No Erythema: No Hemosiderin Staining: No Hemosiderin Staining: No Mottled: No Mottled: No Pallor: No Pallor: No Rubor: No Rubor: No Temperature: No Abnormality No Abnormality N/A Tenderness on Yes Yes N/A Palpation: Wound Preparation: Ulcer Cleansing: Ulcer Cleansing: N/A Rinsed/Irrigated with Rinsed/Irrigated with Saline Saline Topical Anesthetic Topical Anesthetic Applied: Other: Lidocaine Applied: Other: lidocaine 4% 4% Procedures Performed: Debridement N/A N/A Treatment Notes Electronic Signature(s) Signed: 02/06/2017 8:40:25 AM By: Christin Fudge MD, FACS Entered By: Christin Fudge on 02/06/2017 08:40:25 Feeny, [name] ([id number]) -------------------------------------------------------------------------------- [place] Details Patient Name: ABISAI, COBLE. Date of Service: 02/06/2017 8:00 AM Medical Record Number: [id number] Patient Account Number: [id number] Date of Birth/Sex: 09-16-1966 (51 y.o. Female) Treating RN:  Carolyne Fiscal, Debi Primary Care Cherine Drumgoole: Delight Stare Other Clinician: Referring Tanush Drees: Delight Stare Treating Obie Silos/Extender: Frann Rider in Treatment: 2 Active Inactive ` Orientation to the Wound Care Program Nursing Diagnoses: Knowledge deficit related to the wound healing center program Goals: Patient/caregiver will verbalize understanding of the [facility] Program Date Initiated: 01/23/2017 Target Resolution Date: 05/25/2017 Goal Status: Active Interventions: Provide education on orientation to the wound center Notes: ` Peripheral Neuropathy Nursing Diagnoses: Knowledge deficit related to disease process and management of peripheral neurovascular dysfunction Potential alteration in peripheral tissue perfusion (select prior to confirmation of diagnosis) Goals: Patient/caregiver will verbalize understanding of disease process and disease management Date Initiated: 01/23/2017 Target Resolution Date: 05/25/2017 Goal Status: Active Interventions: Assess signs and symptoms of neuropathy upon admission and as needed Provide education on Management of Neuropathy and Related Ulcers Provide education on Management of Neuropathy upon discharge from the [facility] Activities: Test ordered outside of clinic : 01/23/2017 Notes: ADALIDA, GARVER ([id number]) ` Pressure Nursing Diagnoses: Knowledge deficit related to causes and risk factors for pressure ulcer development Knowledge deficit related to management of pressures ulcers Potential for impaired tissue integrity related to pressure, friction, moisture, and shear Goals: Patient will remain free from development of additional pressure ulcers  Date Initiated: 01/23/2017 Target Resolution Date: 05/25/2017 Goal Status: Active Patient will remain free of pressure ulcers Date Initiated: 01/23/2017 Target Resolution Date: 05/25/2017 Goal Status: Active Patient/caregiver will verbalize risk factors for pressure  ulcer development Date Initiated: 01/23/2017 Target Resolution Date: 05/25/2017 Goal Status: Active Patient/caregiver will verbalize understanding of pressure ulcer management Date Initiated: 01/23/2017 Target Resolution Date: 05/25/2017 Goal Status: Active Interventions: Assess: immobility, friction, shearing, incontinence upon admission and as needed Assess offloading mechanisms upon admission and as needed Assess potential for pressure ulcer upon admission and as needed Provide education on pressure ulcers Treatment Activities: Patient referred for pressure reduction/relief devices : 01/23/2017 Patient referred for seating evaluation to ensure proper offloading : 01/23/2017 Pressure reduction/relief device ordered : 01/23/2017 Notes: ` Wound/Skin Impairment Nursing Diagnoses: Impaired tissue integrity Knowledge deficit related to smoking impact on wound healing Knowledge deficit related to ulceration/compromised skin integrity Goals: Patient/caregiver will verbalize understanding of skin care regimen ANJELICA, GORNIAK ([id number]) Date Initiated: 01/23/2017 Target Resolution Date: 05/25/2017 Goal Status: Active Ulcer/skin breakdown will have a volume reduction of 30% by week 4 Date Initiated: 01/23/2017 Target Resolution Date: 05/25/2017 Goal Status: Active Ulcer/skin breakdown will have a volume reduction of 50% by week 8 Date Initiated: 01/23/2017 Target Resolution Date: 05/25/2017 Goal Status: Active Ulcer/skin breakdown will have a volume reduction of 80% by week 12 Date Initiated: 01/23/2017 Target Resolution Date: 05/25/2017 Goal Status: Active Ulcer/skin breakdown will heal within 14 weeks Date Initiated: 01/23/2017 Target Resolution Date: 05/25/2017 Goal Status: Active Interventions: Assess patient/caregiver ability to obtain necessary supplies Assess patient/caregiver ability to perform ulcer/skin care regimen upon admission and as needed Assess ulceration(s) every visit Provide education  on smoking Provide education on ulcer and skin care Treatment Activities: Patient referred to home care : 01/23/2017 Referred to DME Hjalmar Ballengee for dressing supplies : 01/23/2017 Skin care regimen initiated : 01/23/2017 Topical wound management initiated : 01/23/2017 Notes: Electronic Signature(s) Signed: 02/07/2017 4:34:11 PM By: Alric Quan Entered By: Alric Quan on 02/06/2017 08:23:28 Girten, Peta S. ([id number]) -------------------------------------------------------------------------------- Pain Assessment Details Patient Name: [name] Date of Service: 02/06/2017 8:00 AM Medical Record Number: [id number] Patient Account Number: [id number] Date of Birth/Sex: 1966-06-10 (51 y.o. Female) Treating RN: Carolyne Fiscal, Debi Primary Care Reedy Biernat: Delight Stare Other Clinician: Referring Myshawn Chiriboga: Delight Stare Treating Kyng Matlock/Extender: Frann Rider in Treatment: 2 Active Problems Location of Pain Severity and Description of Pain Patient Has Paino Yes Site Locations Pain Location: Pain in Ulcers With Dressing Change: Yes Rate the pain. Current Pain Level: 9 Character of Pain Describe the Pain: Aching Pain Management and Medication Current Pain Management: Electronic Signature(s) Signed: 02/07/2017 4:34:11 PM By: Alric Quan Entered By: Alric Quan on 02/06/2017 08:11:50 Degrazia, Leahna Chauncey Cruel ([id number]) -------------------------------------------------------------------------------- Patient/Caregiver Education Details Patient Name: [name] Date of Service: 02/06/2017 8:00 AM Medical Record Number: [id number] Patient Account Number: [id number] Date of Birth/Gender: May 06, 1966 (51 y.o. Female) Treating RN: Carolyne Fiscal, Debi Primary Care Physician: Delight Stare Other Clinician: Referring Physician: Delight Stare Treating Physician/Extender: Frann Rider in Treatment: 2 Education Assessment Education Provided To: Patient Education Topics  Provided Wound/Skin Impairment: Handouts: Other: change dressing as ordered Methods: Demonstration, Explain/Verbal Responses: State content correctly Electronic Signature(s) Signed: 02/07/2017 4:34:11 PM By: Alric Quan Entered By: Alric Quan on 02/06/2017 08:24:16 Lindblad, Debborah S. ([id number]) -------------------------------------------------------------------------------- Wound Assessment Details Patient Name: [name] Date of Service: 02/06/2017 8:00 AM Medical Record Number: [id number] Patient Account Number: [id number] Date of Birth/Sex: [birth year] (51 y.o. Female) Treating RN: Ahmed Prima Primary Care Trindon Dorton: Lalley,  LINDA Other Clinician: Referring De Jaworski: Rigel, LINDA Treating Keshon Markovitz/Extender: Frann Rider in Treatment: 2 Wound Status Wound Number: 1 Primary Diabetic Wound/Ulcer of the Lower Etiology: Extremity Wound Location: Right Malleolus - Lateral Wound Open Wounding Event: Gradually Appeared Status: Date Acquired: 09/23/2016 Comorbid Anemia, Arrhythmia, Coronary Artery Weeks Of Treatment: 2 History: Disease, Hypotension, Peripheral Clustered Wound: No Arterial Disease, Type II Diabetes, Osteoarthritis Photos Photo Uploaded By: Alric Quan on 02/06/2017 16:15:23 Wound Measurements Length: (cm) 1.6 Width: (cm) 2 Depth: (cm) 0.3 Area: (cm) 2.513 Volume: (cm) 0.754 % Reduction in Area: 36% % Reduction in Volume: 61.6% Epithelialization: None Tunneling: No Undermining: Yes Starting Position (o'clock): 6 Ending Position (o'clock): 9 Maximum Distance: (cm) 1 Wound Description Classification: Grade 2 Wound Margin: Flat and Intact Exudate Amount: Large Exudate Type: Serosanguineous Exudate Color: red, brown Fill, Edith S. ([id number]) Foul Odor After Cleansing: No Slough/Fibrino Yes Wound Bed Granulation Amount: Small (1-33%) Exposed Structure Granulation Quality: Pink Fascia Exposed: No Necrotic Amount:  Large (67-100%) Fat Layer (Subcutaneous Tissue) Exposed: No Necrotic Quality: Adherent Slough Tendon Exposed: No Muscle Exposed: No Joint Exposed: No Bone Exposed: No Limited to Skin Breakdown Periwound Skin Texture Texture Color No Abnormalities Noted: No No Abnormalities Noted: No Callus: No Atrophie Blanche: No Crepitus: No Cyanosis: No Excoriation: No Ecchymosis: No Induration: No Erythema: No Rash: No Hemosiderin Staining: No Scarring: No Mottled: No Pallor: No Moisture Rubor: No No Abnormalities Noted: No Dry / Scaly: No Temperature / Pain Maceration: Yes Temperature: No Abnormality Tenderness on Palpation: Yes Wound Preparation Ulcer Cleansing: Rinsed/Irrigated with Saline Topical Anesthetic Applied: Other: Lidocaine 4%, Treatment Notes Wound #1 (Right, Lateral Malleolus) 1. Cleansed with: Clean wound with Normal Saline 2. Anesthetic Topical Lidocaine 4% cream to wound bed prior to debridement 4. Dressing Applied: Santyl Ointment 5. Secondary Dressing Applied Bordered Foam Dressing Dry Gauze Electronic Signature(s) Signed: 02/07/2017 4:34:11 PM By: Alric Quan Entered By: Alric Quan on 02/06/2017 08:17:37 Eichelberger, Vannia S. ([id number]) -------------------------------------------------------------------------------- Wound Assessment Details Patient Name: [name] Date of Service: 02/06/2017 8:00 AM Medical Record Number: [id number] Patient Account Number: [id number] Date of Birth/Sex: 02-23-1966 (51 y.o. Female) Treating RN: Carolyne Fiscal, Debi Primary Care Bruce Mayers: Delight Stare Other Clinician: Referring Jaysiah Marchetta: Delight Stare Treating Cicero Noy/Extender: Frann Rider in Treatment: 2 Wound Status Wound Number: 2 Primary Open Surgical Wound Etiology: Wound Location: Right Gluteus Wound Open Wounding Event: Gradually Appeared Status: Date Acquired: 09/18/2016 Comorbid Anemia, Arrhythmia, Coronary Artery Weeks Of Treatment:  2 History: Disease, Hypotension, Peripheral Clustered Wound: No Arterial Disease, Type II Diabetes, Osteoarthritis Photos Photo Uploaded By: Alric Quan on 02/06/2017 16:15:23 Wound Measurements Length: (cm) 6 Width: (cm) 2.7 Depth: (cm) 3.3 Area: (cm) 12.723 Volume: (cm) 41.987 % Reduction in Area: 33.8% % Reduction in Volume: 56.3% Epithelialization: None Tunneling: No Undermining: No Wound Description Full Thickness Without Exposed Classification: Support Structures Wound Margin: Distinct, outline attached Exudate Large Amount: Exudate Type: Serosanguineous Exudate Color: red, brown Foul Odor After Cleansing: No Slough/Fibrino Yes Wound Bed Granulation Amount: Large (67-100%) Exposed Structure Vanhoesen, Lizvet S. ([id number]) Granulation Quality: Pink, Pale Fascia Exposed: No Necrotic Amount: Small (1-33%) Fat Layer (Subcutaneous Tissue) Exposed: Yes Necrotic Quality: Adherent Slough Tendon Exposed: No Muscle Exposed: No Joint Exposed: No Bone Exposed: No Periwound Skin Texture Texture Color No Abnormalities Noted: No No Abnormalities Noted: No Callus: No Atrophie Blanche: No Crepitus: No Cyanosis: No Excoriation: No Ecchymosis: No Induration: Yes Erythema: No Rash: No Hemosiderin Staining: No Scarring: No Mottled: No Pallor: No Moisture Rubor: No No Abnormalities Noted: No  Dry / Scaly: No Temperature / Pain Maceration: No Temperature: No Abnormality Tenderness on Palpation: Yes Wound Preparation Ulcer Cleansing: Rinsed/Irrigated with Saline Topical Anesthetic Applied: Other: lidocaine 4%, Treatment Notes Wound #2 (Right Gluteus) 1. Cleansed with: Clean wound with Normal Saline 2. Anesthetic Topical Lidocaine 4% cream to wound bed prior to debridement 3. Peri-wound Care: Skin Prep 4. Dressing Applied: Hydrafera Blue 5. Secondary Dressing Applied Bordered Foam Dressing Notes HHRN to replace NPWT tomorrow Electronic  Signature(s) Signed: 02/07/2017 4:34:11 PM By: Alric Quan Entered By: Alric Quan on 02/06/2017 08:22:35 Mcvay, Callaway Chauncey Cruel ([id number]) -------------------------------------------------------------------------------- [place] Details Patient Name: [name] Date of Service: 02/06/2017 8:00 AM Medical Record Number: [id number] Patient Account Number: [id number] Date of Birth/Sex: [birth year] (51 y.o. Female) Treating RN: Carolyne Fiscal, Debi Primary Care Osiris Charles: Delight Stare Other Clinician: Referring Findlay Dagher: Delight Stare Treating Adisyn Ruscitti/Extender: Frann Rider in Treatment: 2 Vital Signs Time Taken: 08:12 Temperature (F): 98.0 Height (in): 67 Pulse (bpm): 102 Weight (lbs): 137 Respiratory Rate (breaths/min): 18 Body Mass Index (BMI): 21.5 Blood Pressure (mmHg): 98/62 Reference Range: 80 - 120 mg / dl Electronic Signature(s) Signed: 02/07/2017 4:34:11 PM By: Alric Quan Entered By: Alric Quan on 02/06/2017 08:12:10

## 2017-02-08 NOTE — Progress Notes (Signed)
SHAMECA, LANDEN ([id number]) Visit Report for 02/06/2017 Chief Complaint Document Details Patient Name: [name], [name]. Date of Service: 02/06/2017 8:00 AM Medical Record Number: [id number] Patient Account Number: [id number] Date of Birth/Sex: 02-04-1966 (51 y.o. Female) Treating RN: Carolyne Fiscal, Debi Primary Care Provider: Delight Stare Other Clinician: Referring Provider: Delight Stare Treating Provider/Extender: Frann Rider in Treatment: 2 Information Obtained from: Patient Chief Complaint Patients presents for treatment of an open diabetic ulcer with an arterial etiology on her right lateral ankle and right gluteal area for about 4 months Electronic Signature(s) Signed: 02/06/2017 8:40:37 AM By: Christin Fudge MD, FACS Entered By: Christin Fudge on 02/06/2017 08:40:37 [name], [name] ([id number]) -------------------------------------------------------------------------------- Debridement Details Patient Name: [name]. Date of Service: 02/06/2017 8:00 AM Medical Record Number: [id number] Patient Account Number: [id number] Date of Birth/Sex: 09-29-1966 (51 y.o. Female) Treating RN: Carolyne Fiscal, Debi Primary Care Provider: Delight Stare Other Clinician: Referring Provider: Delight Stare Treating Provider/Extender: Frann Rider in Treatment: 2 Debridement Performed for Wound #1 Right,Lateral Malleolus Assessment: Performed By: Physician Christin Fudge, MD Debridement: Debridement Pre-procedure Yes - 08:30 Verification/Time Out Taken: Start Time: 08:31 Pain Control: Lidocaine 4% Topical Solution Level: Skin/Subcutaneous Tissue Total Area Debrided (L x 1.6 (cm) x 2 (cm) = 3.2 (cm) W): Tissue and other Viable, Non-Viable, Exudate, Fibrin/Slough, Subcutaneous material debrided: Instrument: Curette Bleeding: Minimum Hemostasis Achieved: Pressure End Time: 08:33 Procedural Pain: 0 Post Procedural Pain: 0 Response to Treatment: Procedure was tolerated well Post  Debridement Measurements of Total Wound Length: (cm) 1.6 Width: (cm) 2 Depth: (cm) 0.3 Volume: (cm) 0.754 Character of Wound/Ulcer Post Requires Further Debridement Debridement: Severity of Tissue Post Debridement: Fat layer exposed Post Procedure Diagnosis Same as Pre-procedure Electronic Signature(s) Signed: 02/06/2017 8:40:32 AM By: Christin Fudge MD, FACS Signed: 02/07/2017 4:34:11 PM By: Alric Quan Entered By: Christin Fudge on 02/06/2017 08:40:32 Dishner, [name] ([id number]) [name], [name] ([id number]) -------------------------------------------------------------------------------- HPI Details Patient Name: [name], [name] Date of Service: 02/06/2017 8:00 AM Medical Record Number: [id number] Patient Account Number: [id number] Date of Birth/Sex: 1966/03/21 (51 y.o. Female) Treating RN: Carolyne Fiscal, Debi Primary Care Provider: Delight Stare Other Clinician: Referring Provider: Delight Stare Treating Provider/Extender: Frann Rider in Treatment: 2 History of Present Illness Location: right lateral ankle and right gluteal area Quality: Patient reports experiencing a sharp pain to affected area(s). Severity: Patient states wound are getting worse. Duration: Patient has had the wound for > 4 months prior to seeking treatment at the wound center Timing: Pain in wound is constant (hurts all the time) Context: The wound occurred when the patient was in hospital with a necrotizing fasciitis of the right gluteal area and a ulceration on the right ankle Modifying Factors: Other treatment(s) tried include:admitted to the hospital for IV antibiotics and a full workup and has also had a recent angiogram Associated Signs and Symptoms: Patient reports having increase discharge. HPI Description: 51 year old patient was sent to [place] from [facility] where she was seen by Dr. Sherril Cong for a left ankle ulceration and was recently hospitalized with hypotension and  sepsis. She was treated with IV antibiotics and has been scheduled to see Dr. Sharol Given. She was seen by vascular surgery who recommended a femoral bypass but surgery has been delayed until her sacral wound from last year's necrotizing fasciitis has healed. Was seeing the wound care team at [facility] but wanted to change over. She is a smoker and smokes a pack of cigarettes a day The patient was recently admitted in [place] between February  2 and February 14. She had a follow- up to see vascular surgery, orthopedic surgery and infectious disease. 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 Review of the electronic medical records reveals that Dr. 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 An MRI showed edema in the bone which was consistent with osteomyelitis and the chronic ulcer and may need surgical intervention. She also had a sacral decubitus ulcer which was treated with the wound VAC. On 01/07/2017 she was taken up by the vascular surgeon Dr. Trula Slade for an abdominal aortogram and bilateral lower extremity runoff, for a history of having bilateral femoropopliteal bypass graft as well as external iliac stenting on the right and stenting of her bypass graft. She had developed a nonhealing wound on her right ankle and there was a possibility of a femoral occlusion. the findings were noted and the impression was a surgical revascularization with a aorto bifemoral bypass graft. Both the femoropopliteal bypass grafts were patent. 01/31/2017 -- x-ray of the right hip and  pelvis -- IMPRESSION:No radiographic evidence of acute osteomyelitis. Normal-appearing right hip joint space for age. No acute bony abnormality of the hip. 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 Since seeing her last week she has not had an appointment with infectious disease or the orthopedic specialist yet. SITA, MANGEN ([id number]) 02/06/2017 -- the patient missed a couple of appointments yesterday due to the weather but other than that has apparently given up smoking for the last 5 days. Electronic Signature(s) Signed: 02/06/2017 8:41:04 AM By: Christin Fudge MD, FACS Entered By: Christin Fudge on 02/06/2017 08:41:04 Pollack, [name] ([id number]) -------------------------------------------------------------------------------- Physical Exam Details Patient Name: [name], [name] Date of Service: 02/06/2017 8:00 AM Medical Record Number: [id number] Patient Account Number: [id number] Date of Birth/Sex: 1966-11-04 (51 y.o. Female) Treating RN: Carolyne Fiscal, Debi Primary Care Provider: Delight Stare Other Clinician: Referring Provider: Delight Stare Treating Provider/Extender: Frann Rider in Treatment: 2 Constitutional . Pulse regular. Respirations normal and unlabored. Afebrile. . Eyes Nonicteric. Reactive to light. Ears, Nose, Mouth, and Throat Lips, teeth, and gums WNL.Marland Kitchen Moist mucosa without lesions. Neck supple and nontender. No palpable supraclavicular or cervical adenopathy. Normal sized without goiter. Respiratory WNL. No retractions.. Cardiovascular Pedal Pulses WNL. No clubbing, cyanosis or edema. Lymphatic No adneopathy. No adenopathy. No adenopathy. Musculoskeletal Adexa without tenderness or enlargement.. Digits and nails w/o clubbing, cyanosis, infection, petechiae, ischemia, or inflammatory conditions.. Integumentary (Hair, Skin) No suspicious lesions. No crepitus or fluctuance. No peri-wound warmth  or erythema. No masses.Marland Kitchen Psychiatric Judgement and insight Intact.. No evidence of depression, anxiety, or agitation.. Notes the right gluteal wound looks very good and there is no necrotic debris to remove and at this stage we will use the wound VAC without Santyl ointment. The right lateral ankle has a lot of subcutaneous debris and are sharp. Removed this with a #3 curet and minimal bleeding controlled with pressure Electronic Signature(s) Signed: 02/06/2017 8:49:36 AM By: Christin Fudge MD, FACS Entered By: Christin Fudge on 02/06/2017 08:49:36 Gongora, [name] ([id number]) -------------------------------------------------------------------------------- Physician Orders Details Patient Name: Jamie Millman  S. Date of Service: 02/06/2017 8:00 AM Medical Record Number: [id number] Patient Account Number: [id number] Date of Birth/Sex: September 01, 1966 (51 y.o. Female) Treating RN: Carolyne Fiscal, Debi Primary Care Provider: Delight Stare Other Clinician: Referring Provider: Delight Stare Treating Provider/Extender: Frann Rider in Treatment: 2 Verbal / Phone Orders: Yes Clinician: Pinkerton, Debi Read Back and Verified: Yes Diagnosis Coding Wound Cleansing Wound #1 Right,Lateral Malleolus o Clean wound with Normal Saline. Wound #2 Right Gluteus o Clean wound with Normal Saline. Anesthetic Wound #1 Right,Lateral Malleolus o Topical Lidocaine 4% cream applied to wound bed prior to debridement Wound #2 Right Gluteus o Topical Lidocaine 4% cream applied to wound bed prior to debridement Skin Barriers/Peri-Wound Care Wound #1 Right,Lateral Malleolus o Skin Prep Wound #2 Right Gluteus o Skin Prep Primary Wound Dressing Wound #1 Right,Lateral Malleolus o Santyl Ointment Wound #2 Right Gluteus o Hydrafera Blue - on granulated tissue under NPWT drape Secondary Dressing Wound #1 Right,Lateral Malleolus o Dry Gauze o Boardered Foam Dressing Wound #2 Right Gluteus o  Boardered Foam Dressing - in [facility] only - HHRN to replace NPWT tomorrow FREJA, FARO ([id number]) Dressing Change Frequency Wound #1 Right,Lateral Malleolus o Change dressing every day. Wound #2 Right Gluteus o Change Dressing Monday, Wednesday, Friday Follow-up Appointments Wound #1 Right,Lateral Malleolus o Return Appointment in 1 week. Wound #2 Right Gluteus o Return Appointment in 1 week. Additional Orders / Instructions Wound #1 Right,Lateral Malleolus o Stop Smoking o Increase protein intake. o Activity as tolerated o Other: - Please add vitamin A, vitamin C and zinc supplements to your diet Wound #2 Right Gluteus o Stop Smoking o Increase protein intake. o Activity as tolerated o Other: - Please add vitamin A, vitamin C and zinc supplements to your diet Home Health Wound #1 [place] Visits - [place] Nurse may visit PRN to address patientos wound care needs. o FACE TO FACE ENCOUNTER: MEDICARE and MEDICAID PATIENTS: I certify that this patient is under my care and that I had a face-to-face encounter that meets the physician face-to-face encounter requirements with this patient on this date. The encounter with the patient was in whole or in part for the following MEDICAL CONDITION: (primary reason for [place]) MEDICAL NECESSITY: I certify, that based on my findings, NURSING services are a medically necessary home health service. HOME BOUND STATUS: I certify that my clinical findings support that this patient is homebound (i.e., Due to illness or injury, pt requires aid of supportive devices such as crutches, cane, wheelchairs, walkers, the use of special transportation or the assistance of another person to leave their place of residence. There is a normal inability to leave the home and doing so requires considerable and taxing effort. Other absences are for medical  reasons / religious services and are infrequent or of short duration when for other reasons). o If current dressing causes regression in wound condition, may D/C ordered dressing product/s and apply Normal Saline Moist Dressing daily until next [place] / Other MD appointment. [place] of regression in wound condition at [phone]. o Please direct any NON-WOUND related issues/requests for orders to patient's Primary Care Physician [name], [name] ([id number]) Wound #2 Right [place] Visits - [place] Nurse may visit PRN to address patientos wound care needs. o FACE TO FACE ENCOUNTER: MEDICARE and MEDICAID PATIENTS: I certify that this patient is under my care and that I  had a face-to-face encounter that meets the physician face-to-face encounter requirements with this patient on this date. The encounter with the patient was in whole or in part for the following MEDICAL CONDITION: (primary reason for [place]) MEDICAL NECESSITY: I certify, that based on my findings, NURSING services are a medically necessary home health service. HOME BOUND STATUS: I certify that my clinical findings support that this patient is homebound (i.e., Due to illness or injury, pt requires aid of supportive devices such as crutches, cane, wheelchairs, walkers, the use of special transportation or the assistance of another person to leave their place of residence. There is a normal inability to leave the home and doing so requires considerable and taxing effort. Other absences are for medical reasons / religious services and are infrequent or of short duration when for other reasons). o If current dressing causes regression in wound condition, may D/C ordered dressing product/s and apply Normal Saline Moist Dressing daily until next [place] / Other MD appointment. [place] of regression  in wound condition at [phone]. o Please direct any NON-WOUND related issues/requests for orders to patient's Primary Care Physician Negative Pressure Wound Therapy Wound #2 Right Gluteus o Wound VAC settings at 125/130 mmHg continuous pressure. Use BLACK/GREEN foam to wound cavity. Use WHITE foam to fill any tunnel/s and/or undermining. Change VAC dressing 3 X WEEK. Change canister as indicated when full. Nurse may titrate settings and frequency of dressing changes as clinically indicated. - black foam to wound cavity only - use Hydrafera blue to granulated tissue Medications-please add to medication list. Wound #1 Right,Lateral Malleolus o Santyl Enzymatic Ointment Electronic Signature(s) Signed: 02/06/2017 4:00:38 PM By: Christin Fudge MD, FACS Signed: 02/07/2017 4:34:11 PM By: Alric Quan Entered By: Alric Quan on 02/06/2017 08:40:12 Nardo, Temperance S. ([id number]) -------------------------------------------------------------------------------- Problem List Details Patient Name: AGATHA, DUPLECHAIN S. Date of Service: 02/06/2017 8:00 AM Medical Record Number: [id number] Patient Account Number: [id number] Date of Birth/Sex: 1966/09/27 (51 y.o. Female) Treating RN: Carolyne Fiscal, Debi Primary Care Provider: Delight Stare Other Clinician: Referring Provider: Delight Stare Treating Provider/Extender: Frann Rider in Treatment: 2 Active Problems ICD-10 Encounter Code Description Active Date Diagnosis E11.621 Type 2 diabetes mellitus with foot ulcer 01/23/2017 Yes L89.314 Pressure ulcer of right buttock, stage 4 01/23/2017 Yes L97.312 Non-pressure chronic ulcer of right ankle with fat layer 01/23/2017 Yes exposed F17.218 Nicotine dependence, cigarettes, with other nicotine- 01/23/2017 Yes induced disorders I70.233 Atherosclerosis of native arteries of right leg with 01/23/2017 Yes ulceration of ankle M86.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, right ankle and foot 01/23/2017  Yes Inactive Problems Resolved Problems Electronic Signature(s) Signed: 02/06/2017 8:40:22 AM By: Christin Fudge MD, FACS Entered By: Christin Fudge on 02/06/2017 08:40:22 Steffenhagen, Najia S. ([id number]) -------------------------------------------------------------------------------- Progress Note Details Patient Name: [name]. Date of Service: 02/06/2017 8:00 AM Medical Record Number: [id number] Patient Account Number: [id number] Date of Birth/Sex: 1966-05-15 (51 y.o. Female) Treating RN: Carolyne Fiscal, Debi Primary Care Provider: Delight Stare Other Clinician: Referring Provider: Delight Stare Treating Provider/Extender: Frann Rider in Treatment: 2 Subjective Chief Complaint Information obtained from Patient Patients presents for treatment of an open diabetic ulcer with an arterial etiology on her right lateral ankle and right gluteal area for about 4 months History of Present Illness (HPI) The following HPI elements were documented for the patient's wound: Location: right lateral ankle and right gluteal area Quality: Patient reports experiencing a sharp pain to affected area(s). Severity: Patient states wound are getting worse. Duration: Patient has had the wound  for > 4 months prior to seeking treatment at the wound center Timing: Pain in wound is constant (hurts all the time) Context: The wound occurred when the patient was in hospital with a necrotizing fasciitis of the right gluteal area and a ulceration on the right ankle Modifying Factors: Other treatment(s) tried include:admitted to the hospital for IV antibiotics and a full workup and has also had a recent angiogram Associated Signs and Symptoms: Patient reports having increase discharge. 51 year old patient was sent to [place] from [facility] where she was seen by Dr. Sherril Cong for a left ankle ulceration and was recently hospitalized with hypotension and sepsis. She was treated with IV antibiotics and has  been scheduled to see Dr. Sharol Given. She was seen by vascular surgery who recommended a femoral bypass but surgery has been delayed until her sacral wound from last year's necrotizing fasciitis has healed. Was seeing the wound care team at [facility] but wanted to change over. She is a smoker and smokes a pack of cigarettes a day The patient was recently admitted in [place] between February 2 and February 14. She had a follow- up to see vascular surgery, orthopedic surgery and infectious disease. 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 Review of the electronic medical records reveals that Dr. 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 An MRI showed edema in the bone which was consistent with osteomyelitis and the chronic ulcer and may need surgical intervention. She also had a sacral decubitus ulcer which was treated with the wound VAC. On 01/07/2017 she was taken up by the vascular surgeon Dr. Trula Slade for an abdominal aortogram and bilateral lower extremity runoff, for a history of having bilateral femoropopliteal bypass graft as well as external iliac stenting on the right and stenting of her bypass graft. She had developed a nonhealing wound on her right ankle and there was a possibility of a femoral occlusion. the findings were noted and the [name], DALPORTO. ([id number]) impression was a surgical revascularization with a aorto bifemoral bypass graft. Both the femoropopliteal bypass grafts were patent. 01/31/2017 -- x-ray of the right hip and pelvis -- IMPRESSION:No  radiographic evidence of acute osteomyelitis. Normal-appearing right hip joint space for age. No acute bony abnormality of the hip. 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 Since seeing her last week she has not had an appointment with infectious disease or the orthopedic specialist yet. 02/06/2017 -- the patient missed a couple of appointments yesterday due to the weather but other than that has apparently given up smoking for the last 5 days. Objective Constitutional Pulse regular. Respirations normal and unlabored. Afebrile. Vitals Time Taken: 8:12 AM, Height: 67 in, Weight: 137 lbs, BMI: 21.5, Temperature: 98.0 F, Pulse: 102 bpm, Respiratory Rate: 18 breaths/min, Blood Pressure: 98/62 mmHg. Eyes Nonicteric. Reactive to light. Ears, Nose, Mouth, and Throat Lips, teeth, and gums WNL.Marland Kitchen Moist mucosa without lesions. Neck supple and nontender. No palpable supraclavicular or cervical adenopathy. Normal sized without goiter. Respiratory WNL. No retractions.. Cardiovascular Pedal Pulses WNL. No clubbing, cyanosis or edema. Lymphatic  No adneopathy. No adenopathy. No adenopathy. Musculoskeletal Adexa without tenderness or enlargement.. Digits and nails w/o clubbing, cyanosis, infection, petechiae, ischemia, or inflammatory conditions.Marland Kitchen Psychiatric Judgement and insight Intact.. No evidence of depression, anxiety, or agitation.Marland Kitchen [name], [name] ([id number]) General Notes: the right gluteal wound looks very good and there is no necrotic debris to remove and at this stage we will use the wound VAC without Santyl ointment. The right lateral ankle has a lot of subcutaneous debris and are sharp. Removed this with a #3 curet and minimal bleeding controlled with pressure Integumentary (Hair, Skin) No suspicious lesions. No crepitus or fluctuance. No peri-wound warmth or erythema. No masses.. Wound #1 status is Open. Original cause of  wound was Gradually Appeared. The wound is located on the Right,Lateral Malleolus. The wound measures 1.6cm length x 2cm width x 0.3cm depth; 2.513cm^2 area and 0.754cm^3 volume. The wound is limited to skin breakdown. There is no tunneling noted, however, there is undermining starting at 6:00 and ending at 9:00 with a maximum distance of 1cm. There is a large amount of serosanguineous drainage noted. The wound margin is flat and intact. There is small (1-33%) pink granulation within the wound bed. There is a large (67-100%) amount of necrotic tissue within the wound bed including Adherent Slough. The periwound skin appearance exhibited: Maceration. The periwound skin appearance did not exhibit: Callus, Crepitus, Excoriation, Induration, Rash, Scarring, Dry/Scaly, Atrophie Blanche, Cyanosis, Ecchymosis, Hemosiderin Staining, Mottled, Pallor, Rubor, Erythema. Periwound temperature was noted as No Abnormality. The periwound has tenderness on palpation. Wound #2 status is Open. Original cause of wound was Gradually Appeared. The wound is located on the Right Gluteus. The wound measures 6cm length x 2.7cm width x 3.3cm depth; 12.723cm^2 area and 41.987cm^3 volume. There is Fat Layer (Subcutaneous Tissue) Exposed exposed. There is no tunneling or undermining noted. There is a large amount of serosanguineous drainage noted. The wound margin is distinct with the outline attached to the wound base. There is large (67-100%) pink, pale granulation within the wound bed. There is a small (1-33%) amount of necrotic tissue within the wound bed including Adherent Slough. The periwound skin appearance exhibited: Induration. The periwound skin appearance did not exhibit: Callus, Crepitus, Excoriation, Rash, Scarring, Dry/Scaly, Maceration, Atrophie Blanche, Cyanosis, Ecchymosis, Hemosiderin Staining, Mottled, Pallor, Rubor, Erythema. Periwound temperature was noted as No Abnormality. The periwound has tenderness  on palpation. 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 ([id number]) Procedures Wound #1 Wound #1 is a Diabetic Wound/Ulcer of the Lower Extremity located on the Right,Lateral Malleolus . There was a Skin/Subcutaneous Tissue Debridement ([id number]) debridement with total area of 3.2 sq cm performed by Christin Fudge, MD. with the following instrument(s): Curette to remove Viable and Non-Viable tissue/material including Exudate, Fibrin/Slough, and Subcutaneous after achieving pain control using Lidocaine 4% Topical Solution. A time out was conducted at 08:30, prior to the start of the procedure. A Minimum amount of bleeding was controlled with Pressure. The procedure was tolerated well with a pain level of 0 throughout and a pain level of 0 following the procedure. Post Debridement Measurements: 1.6cm length x 2cm width x 0.3cm depth; 0.754cm^3 volume. Character of Wound/Ulcer  Post Debridement requires further debridement. Severity of Tissue Post Debridement is: Fat layer exposed. Post procedure Diagnosis Wound #1: Same as Pre-Procedure Plan Wound Cleansing: Wound #1 Right,Lateral Malleolus: Clean wound with Normal Saline. Wound #2 Right Gluteus: Clean wound with Normal Saline. Anesthetic: Wound #1 Right,Lateral Malleolus: Topical Lidocaine 4% cream applied to wound bed prior to debridement Wound #2 Right Gluteus: Topical Lidocaine 4% cream applied to wound bed prior to debridement Skin Barriers/Peri-Wound Care: Wound #1 Right,Lateral Malleolus: Skin Prep Wound #2 Right  Gluteus: Skin Prep Primary Wound Dressing: Wound #1 Right,Lateral Malleolus: Santyl Ointment Wound #2 Right Gluteus: Hydrafera Blue - on granulated tissue under NPWT drape Secondary Dressing: Wound #1 Right,Lateral Malleolus: Dry Gauze Boardered Foam Dressing Wound #2 Right Gluteus: [name], [name] ([id number]) Boardered Foam Dressing - in [facility] [place] only - HHRN to replace NPWT tomorrow Dressing Change Frequency: Wound #1 Right,Lateral Malleolus: Change dressing every day. Wound #2 Right Gluteus: Change Dressing Monday, Wednesday, Friday Follow-up Appointments: Wound #1 Right,Lateral Malleolus: Return Appointment in 1 week. Wound #2 Right Gluteus: Return Appointment in 1 week. Additional Orders / Instructions: Wound #1 Right,Lateral Malleolus: Stop Smoking Increase protein intake. Activity as tolerated Other: - Please add vitamin A, vitamin C and zinc supplements to your diet Wound #2 Right Gluteus: Stop Smoking Increase protein intake. Activity as tolerated Other: - Please add vitamin A, vitamin C and zinc supplements to your diet Home Health: Wound #1 Right,Lateral Malleolus: [facility] Visits - [place] Nurse may visit PRN to address patient s wound care needs. FACE TO FACE ENCOUNTER: MEDICARE and MEDICAID PATIENTS: I certify that this patient is under my care and that I had a face-to-face encounter that meets the physician face-to-face encounter requirements with this patient on this date. The encounter with the patient was in whole or in part for the following MEDICAL CONDITION: (primary reason for [place]) MEDICAL NECESSITY: I certify, that based on my findings, NURSING services are a medically necessary home health service. HOME BOUND STATUS: I certify that my clinical findings support that this patient is homebound (i.e., Due to illness or injury, pt requires aid of supportive devices such as crutches, cane, wheelchairs,  walkers, the use of special transportation or the assistance of another person to leave their place of residence. There is a normal inability to leave the home and doing so requires considerable and taxing effort. Other absences are for medical reasons / religious services and are infrequent or of short duration when for other reasons). If current dressing causes regression in wound condition, may D/C ordered dressing product/s and apply Normal Saline Moist Dressing daily until next [place] / Other MD appointment. [place] of regression in wound condition at [phone]. Please direct any NON-WOUND related issues/requests for orders to patient's Primary Care Physician Wound #2 Right Gluteus: [place] Nurse may visit PRN to address patient s wound care needs. FACE TO FACE ENCOUNTER: MEDICARE and MEDICAID PATIENTS: I certify that this patient is under my care and that I had a face-to-face encounter that meets the physician face-to-face encounter requirements with this patient on this date. The encounter with the patient was in whole or in part for the following MEDICAL CONDITION: (primary reason for [place]) MEDICAL NECESSITY: I certify, that based on my findings, NURSING services are a medically necessary home health service. HOME BOUND STATUS: I certify that my clinical findings support that this patient is  homebound (i.e., Due to illness or injury, pt requires aid of supportive devices such as crutches, cane, wheelchairs, walkers, the use of special transportation or the assistance of another person to leave their place of residence. There is a [name], [name] ([id number]) normal inability to leave the home and doing so requires considerable and taxing effort. Other absences are for medical reasons / religious services and are infrequent or of short duration when for other reasons). If current  dressing causes regression in wound condition, may D/C ordered dressing product/s and apply Normal Saline Moist Dressing daily until next [place] / Other MD appointment. [place] of regression in wound condition at [phone]. Please direct any NON-WOUND related issues/requests for orders to patient's Primary Care Physician Negative Pressure Wound Therapy: Wound #2 Right Gluteus: Wound VAC settings at 125/130 mmHg continuous pressure. Use BLACK/GREEN foam to wound cavity. Use WHITE foam to fill any tunnel/s and/or undermining. Change VAC dressing 3 X WEEK. Change canister as indicated when full. Nurse may titrate settings and frequency of dressing changes as clinically indicated. - black foam to wound cavity only - use Hydrafera blue to granulated tissue Medications-please add to medication list.: Wound #1 Right,Lateral Malleolus: Santyl Enzymatic Ointment After review have recommended: 1. Santyl ointment with a packing to be applied to the right ankle, change daily 2. Hydrofera Blue to the superficial wound and then black foam of the wound VAC, to the right gluteal region to be changed 3 times a week. 3. Follow-up with Dr. Sharol Given regarding the right fibula bone biopsy. appointment pending 4. Follow up with infectious disease regarding long-term antibiotics. appointment pending 5. need to completely give up smoking -- I have commended on being off cigarettes for the last 5 days 6. adequate protein, vitamin A, vitamin C and zinc 7. arterial surgery has been put off till her other issues are resolved She and husband have read all questions answered and will be compliant Electronic Signature(s) Signed: 02/06/2017 8:51:33 AM By: Christin Fudge MD, FACS Entered By: Christin Fudge on 02/06/2017 08:51:33 Fitterer, [name] ([id number]) -------------------------------------------------------------------------------- SuperBill Details Patient Name: [name]. Date  of Service: 02/06/2017 Medical Record Number: [id number] Patient Account Number: [id number] Date of Birth/Sex: 1965/11/24 (51 y.o. 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 M86.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, right ankle and foot Facility Procedures CPT4 Code Description: [id number] 11042 - DEB SUBQ TISSUE 20 SQ CM/< ICD-10 Description Diagnosis E11.621 Type 2 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, stage 4 L97.312 Non-pressure chronic ulcer of right ankle with fat l  M86.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, right ankle and fo Modifier: ayer expose ot Quantity: 1 d Physician Procedures CPT4 Code Description: [id number] - WC PHYS SUBQ TISS 20 SQ CM ICD-10 Description Diagnosis E11.621 Type 2 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

## 2017-02-10 ENCOUNTER — Telehealth: Payer: Self-pay | Admitting: Cardiology

## 2017-02-10 ENCOUNTER — Encounter
Admission: RE | Admit: 2017-02-10 | Discharge: 2017-02-10 | Disposition: A | Discharge status: Home / Self Care | Payer: Medicare Other | Source: Ambulatory Visit | Attending: Cardiology | Admitting: Cardiology

## 2017-02-10 DIAGNOSIS — I739 Peripheral vascular disease, unspecified: Secondary | ICD-10-CM | POA: Insufficient documentation

## 2017-02-10 DIAGNOSIS — Z01810 Encounter for preprocedural cardiovascular examination: Secondary | ICD-10-CM | POA: Insufficient documentation

## 2017-02-10 NOTE — Telephone Encounter (Signed)
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 Per nuc med, pt reports she had coffee this morning. Rescheduled test April 25. Pt reports she can not have test any sooner.  Routed to MD to make aware.

## 2017-02-10 NOTE — Telephone Encounter (Signed)
[facility] nuclear department calling to let us know patient showed up for her Myoview this morning but had some coffee this morning and so she rescheduled her test But patient said she could not come sooner that 03/12/17 to redo the test Would just like to let us know

## 2017-02-11 NOTE — Telephone Encounter (Signed)
Spoke with patients spouse per release form regarding stress test. Let him know that this test is needed before her surgery for [place] to provide cardiac clearance. He verbalized understanding but states that they are delaying her surgery anyway. Instructed him to give [place] a call back if he has any further questions and he was appreciative for the call.

## 2017-02-13 ENCOUNTER — Encounter: Payer: Medicare Other | Admitting: Surgery

## 2017-02-13 DIAGNOSIS — E11621 Type 2 diabetes mellitus with foot ulcer: Secondary | ICD-10-CM | POA: Diagnosis not present

## 2017-02-14 NOTE — Progress Notes (Signed)
BRYLIE, SNEATH ([id number]) Visit Report for 02/13/2017 Chief Complaint Document Details Patient Name: [name], [name]. Date of Service: 02/13/2017 10:30 AM Medical Record Number: [id number] Patient Account Number: [id number] Date of Birth/Sex: 1966/10/28 (51 y.o. Female) Treating RN: Afful, RN, [name], Velva Harman Primary Care Provider: Delight Stare Other Clinician: Referring Provider: Delight Stare Treating Provider/Extender: Frann Rider in Treatment: 3 Information Obtained from: Patient Chief Complaint Patients presents for treatment of an open diabetic ulcer with an arterial etiology on her right lateral ankle and right gluteal area for about 4 months Electronic Signature(s) Signed: 02/13/2017 11:30:06 AM By: Christin Fudge MD, FACS Entered By: Christin Fudge on 02/13/2017 11:30:06 Smail, Sanah Chauncey Cruel ([id number]) -------------------------------------------------------------------------------- HPI Details Patient Name: [name]. Date of Service: 02/13/2017 10:30 AM Medical Record Number: [id number] Patient Account Number: [id number] Date of Birth/Sex: 29-Sep-1966 (51 y.o. Female) Treating RN: Afful, RN, [name], Velva Harman Primary Care Provider: Delight Stare Other Clinician: Referring Provider: Delight Stare Treating Provider/Extender: Frann Rider in Treatment: 3 History of Present Illness Location: right lateral ankle and right gluteal area Quality: Patient reports experiencing a sharp pain to affected area(s). Severity: Patient states wound are getting worse. Duration: Patient has had the wound for > 4 months prior to seeking treatment at the wound center Timing: Pain in wound is constant (hurts all the time) Context: The wound occurred when the patient was in hospital with a necrotizing fasciitis of the right gluteal area and a ulceration on the right ankle Modifying Factors: Other treatment(s) tried include:admitted to the hospital for IV antibiotics and a full workup and has  also had a recent angiogram Associated Signs and Symptoms: Patient reports having increase discharge. HPI Description: 51 year old patient was sent to [place] from [facility] where she was seen by Dr. Sherril Cong for a left ankle ulceration and was recently hospitalized with hypotension and sepsis. She was treated with IV antibiotics and has been scheduled to see Dr. Sharol Given. She was seen by vascular surgery who recommended a femoral bypass but surgery has been delayed until her sacral wound from last year's necrotizing fasciitis has healed. Was seeing the wound care team at [facility] but wanted to change over. She is a smoker and smokes a pack of cigarettes a day The patient was recently admitted in [place] between February 2 and February 14. She had a follow- up to see vascular surgery, orthopedic surgery and infectious disease. 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 Review of the electronic medical records reveals that Dr. 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 An MRI showed edema in the bone which was consistent with osteomyelitis and the chronic ulcer and may need surgical intervention. She also had a sacral decubitus ulcer which was treated with the wound VAC. On 01/07/2017 she was taken up by the vascular surgeon Dr. Trula Slade for an abdominal aortogram and bilateral lower extremity runoff, for a history of having bilateral femoropopliteal bypass graft as well as external iliac stenting on the right and stenting of her bypass graft. She had developed a  nonhealing wound on her right ankle and there was a possibility of a femoral  occlusion. the findings were noted and the impression was a surgical revascularization with a aorto bifemoral bypass graft. Both the femoropopliteal bypass grafts were patent. 01/31/2017 -- x-ray of the right hip and pelvis -- IMPRESSION:No radiographic evidence of acute osteomyelitis. Normal-appearing right hip joint space for age. No acute bony abnormality of the hip. 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 Since seeing her last week she has not had an appointment with infectious disease or the orthopedic specialist yet. CORRISSA, MARTELLO ([id number]) 02/06/2017 -- the patient missed a couple of appointments yesterday due to the weather but other than that has apparently given up smoking for the last 5 days. 02/13/2017 -- she has rescheduled her infectious disease appointment and also the appointment with the orthopedic surgeon Dr. Sharol Given Electronic Signature(s) Signed: 02/13/2017 11:30:43 AM By: Christin Fudge MD, FACS Entered By: Christin Fudge on 02/13/2017 11:30:43 Vallie, [name] ([id number]) -------------------------------------------------------------------------------- Physical Exam Details Patient Name: KAENA, SANTORI S. Date of Service: 02/13/2017 10:30 AM Medical Record Number: [id number] Patient Account Number: [id number] Date of Birth/Sex: [birth year] (51 y.o. Female) Treating RN: Baruch Gouty, RN, [name], [employer] Primary Care Provider: Delight Stare Other Clinician: Referring Provider: Delight Stare Treating Provider/Extender: Frann Rider in Treatment: 3 Constitutional . Pulse regular. Respirations normal and unlabored. Afebrile. . Eyes Nonicteric. Reactive to light. Ears, Nose, Mouth, and Throat Lips, teeth, and gums WNL.Marland Kitchen Moist mucosa without lesions. Neck supple and nontender. No palpable supraclavicular or cervical adenopathy. Normal sized  without goiter. Respiratory WNL. No retractions.. Cardiovascular Pedal Pulses WNL. No clubbing, cyanosis or edema. Lymphatic No adneopathy. No adenopathy. No adenopathy. Musculoskeletal Adexa without tenderness or enlargement.. Digits and nails w/o clubbing, cyanosis, infection, petechiae, ischemia, or inflammatory conditions.. Integumentary (Hair, Skin) No suspicious lesions. No crepitus or fluctuance. No peri-wound warmth or erythema. No masses.Marland Kitchen Psychiatric Judgement and insight Intact.. No evidence of depression, anxiety, or agitation.. Notes both the wounds look very clean the one on the right lateral ankle did not need sharp debridement today. There is some tenderness over the wound and the gluteal area but will continue to use the wound VAC. Electronic Signature(s) Signed: 02/13/2017 11:31:26 AM By: Christin Fudge MD, FACS Entered By: Christin Fudge on 02/13/2017 11:31:25 Wojtkiewicz, [name] ([id number]) -------------------------------------------------------------------------------- Physician Orders Details Patient Name: BRAYLEE, LAL. Date of Service: 02/13/2017 10:30 AM Medical Record Number: [id number] Patient Account Number: [id number] Date of Birth/Sex: 1965-12-12 (51 y.o. Female) Treating RN: Afful, RN, [name], Velva Harman Primary Care Provider: Delight Stare Other Clinician: Referring Provider: Delight Stare Treating Provider/Extender: Frann Rider in Treatment: 3 Verbal / Phone Orders: No Diagnosis Coding Wound Cleansing Wound #1 Right,Lateral Malleolus o Clean wound with Normal Saline. Wound #2 Right Gluteus o Clean wound with Normal Saline. Anesthetic Wound #1 Right,Lateral Malleolus o Topical Lidocaine 4% cream applied to wound bed prior to debridement Wound #2 Right Gluteus o Topical Lidocaine 4% cream applied to wound bed prior to debridement - in clinic Skin Barriers/Peri-Wound Care Wound #1 Right,Lateral Malleolus o Skin Prep - in clinic Wound #2  Right Gluteus o Skin Prep Primary Wound Dressing Wound #1 Right,Lateral Malleolus o Santyl Ointment Wound #2 Right Gluteus o Hydrafera Blue - on granulated tissue then put the black foam over it Secondary Dressing Wound #1 Right,Lateral Malleolus o Dry Gauze o Boardered Foam Dressing - in clinic until [facility] applies VAC Wound #2 Right Gluteus o Boardered Foam Dressing - in [facility] only - HHRN to replace NPWT  tomorrow MAGDELENE, RUARK ([id number]) Dressing Change Frequency Wound #1 Right,Lateral Malleolus o Change dressing every day. Wound #2 Right Gluteus o Change Dressing Monday, Wednesday, Friday Follow-up Appointments Wound #1 Right,Lateral Malleolus o Return Appointment in 1 week. Wound #2 Right Gluteus o Return Appointment in 1 week. Additional Orders / Instructions Wound #1 Right,Lateral Malleolus o Stop Smoking o Increase protein intake. o Activity as tolerated o Other: - Please add vitamin A, vitamin C and zinc supplements to your diet Wound #2 Right Gluteus o Stop Smoking o Increase protein intake. o Activity as tolerated o Other: - Please add vitamin A, vitamin C and zinc supplements to your diet Home Health Wound #1 [place] [facility]its - [place] Nurse may visit PRN to address patientos wound care needs. o FACE TO FACE ENCOUNTER: MEDICARE and MEDICAID PATIENTS: I certify that this patient is under my care and that I had a face-to-face encounter that meets the physician face-to-face encounter requirements with this patient on this date. The encounter with the patient was in whole or in part for the following MEDICAL CONDITION: (primary reason for [place]) MEDICAL NECESSITY: I certify, that based on my findings, NURSING services are a medically necessary home health service. HOME BOUND STATUS: I certify that my clinical findings support that this patient is  homebound (i.e., Due to illness or injury, pt requires aid of supportive devices such as crutches, cane, wheelchairs, walkers, the use of special transportation or the assistance of another person to leave their place of residence. There is a normal inability to leave the home and doing so requires considerable and taxing effort. Other absences are for medical reasons / religious services and are infrequent or of short duration when for other reasons). o If current dressing causes regression in wound condition, may D/C ordered dressing product/s and apply Normal Saline Moist Dressing daily until next [place] / Other MD appointment. [place] of regression in wound condition at [phone]. o Please direct any NON-WOUND related issues/requests for orders to patient's Primary Care Physician LASHEENA, FRIEZE ([id number]) Wound #2 Right [place] Visits - [place] Nurse may visit PRN to address patientos wound care needs. o FACE TO FACE ENCOUNTER: MEDICARE and MEDICAID PATIENTS: I certify that this patient is under my care and that I had a face-to-face encounter that meets the physician face-to-face encounter requirements with this patient on this date. The encounter with the patient was in whole or in part for the following MEDICAL CONDITION: (primary reason for [place]) MEDICAL NECESSITY: I certify, that based on my findings, NURSING services are a medically necessary home health service. HOME BOUND STATUS: I certify that my clinical findings support that this patient is homebound (i.e., Due to illness or injury, pt requires aid of supportive devices such as crutches, cane, wheelchairs, walkers, the use of special transportation or the assistance of another person to leave their place of residence. There is a normal inability to leave the home and doing so requires considerable and taxing effort.  Other absences are for medical reasons / religious services and are infrequent or of short duration when for other reasons). o If current dressing causes regression in wound condition, may D/C ordered dressing product/s and apply Normal Saline Moist Dressing daily until next [place] / Other MD appointment. [place] of regression in wound condition at [phone]. o Please direct  any NON-WOUND related issues/requests for orders to patient's Primary Care Physician Negative Pressure Wound Therapy Wound #2 Right Gluteus o Wound VAC settings at 125/130 mmHg continuous pressure. Use BLACK/GREEN foam to wound cavity. Use WHITE foam to fill any tunnel/s and/or undermining. Change VAC dressing 3 X WEEK. Change canister as indicated when full. Nurse may titrate settings and frequency of dressing changes as clinically indicated. - black foam to wound cavity only - use Hydrafera blue to granulated tissue o Other: - CLINIC RN DID APPLIED WOUND VAC TODAY IN THE CLINIC Medications-please add to medication list. Wound #1 Right,Lateral Malleolus o Santyl Enzymatic Ointment Electronic Signature(s) Signed: 02/13/2017 3:13:18 PM By: Christin Fudge MD, FACS Signed: 02/13/2017 4:02:33 PM By: Regan Lemming [name], RN Entered By: Regan Lemming on 02/13/2017 11:02:47 Riggins, Javona Chauncey Cruel ([id number]) -------------------------------------------------------------------------------- Problem List Details Patient Name: MARCA, GADSBY. Date of Service: 02/13/2017 10:30 AM Medical Record Number: [id number] Patient Account Number: [id number] Date of Birth/Sex: 28-Mar-1966 (51 y.o. Female) Treating RN: Afful, RN, [name], [employer] Primary Care Provider: Delight Stare Other Clinician: Referring Provider: Delight Stare Treating Provider/Extender: Frann Rider in Treatment: 3 Active Problems ICD-10 Encounter Code Description Active Date Diagnosis E11.621 Type 2 diabetes mellitus with foot  ulcer 01/23/2017 Yes L89.314 Pressure ulcer of right buttock, stage 4 01/23/2017 Yes L97.312 Non-pressure chronic ulcer of right ankle with fat layer 01/23/2017 Yes exposed F17.218 Nicotine dependence, cigarettes, with other nicotine- 01/23/2017 Yes induced disorders I70.233 Atherosclerosis of native arteries of right leg with 01/23/2017 Yes ulceration of ankle M86.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, right ankle and foot 01/23/2017 Yes Inactive Problems Resolved Problems Electronic Signature(s) Signed: 02/13/2017 11:29:52 AM By: Christin Fudge MD, FACS Entered By: Christin Fudge on 02/13/2017 11:29:52 Wigington, Aariana Chauncey Cruel ([id number]) -------------------------------------------------------------------------------- Progress Note Details Patient Name: [name]. Date of Service: 02/13/2017 10:30 AM Medical Record Number: [id number] Patient Account Number: [id number] Date of Birth/Sex: 08-21-1966 (51 y.o. Female) Treating RN: Afful, RN, [name], [employer] Primary Care Provider: Delight Stare Other Clinician: Referring Provider: Delight Stare Treating Provider/Extender: Frann Rider in Treatment: 3 Subjective Chief Complaint Information obtained from Patient Patients presents for treatment of an open diabetic ulcer with an arterial etiology on her right lateral ankle and right gluteal area for about 4 months History of Present Illness (HPI) The following HPI elements were documented for the patient's wound: Location: right lateral ankle and right gluteal area Quality: Patient reports experiencing a sharp pain to affected area(s). Severity: Patient states wound are getting worse. Duration: Patient has had the wound for > 4 months prior to seeking treatment at the wound center Timing: Pain in wound is constant (hurts all the time) Context: The wound occurred when the patient was in hospital with a necrotizing fasciitis of the right gluteal area and a ulceration on the right ankle Modifying Factors:  Other treatment(s) tried include:admitted to the hospital for IV antibiotics and a full workup and has also had a recent angiogram Associated Signs and Symptoms: Patient reports having increase discharge. 51 year old patient was sent to [place] from [facility] where she was seen by Dr. Sherril Cong for a left ankle ulceration and was recently hospitalized with hypotension and sepsis. She was treated with IV antibiotics and has been scheduled to see Dr. Sharol Given. She was seen by vascular surgery who recommended a femoral bypass but surgery has been delayed until her sacral wound from last year's necrotizing fasciitis has healed. Was seeing the wound care team at [facility] but wanted to change over. She is a  smoker and smokes a pack of cigarettes a day The patient was recently admitted in [place] between February 2 and February 14. She had a follow- up to see vascular surgery, orthopedic surgery and infectious disease. 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 Review of the electronic medical records reveals that Dr. 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 An MRI showed edema in the bone which was consistent with osteomyelitis and the chronic ulcer and may need surgical intervention. She also had a sacral decubitus ulcer which was treated with the wound VAC. On 01/07/2017 she was taken up by the vascular surgeon Dr. Trula Slade for an abdominal aortogram and bilateral lower extremity runoff, for a history of having bilateral femoropopliteal bypass graft as well  as external iliac stenting on the right and stenting of her bypass graft. She had developed a nonhealing wound on her right ankle and there was a possibility of a femoral occlusion. the findings were noted and the CRISTLE, JARED. ([id number]) impression was a surgical revascularization with a aorto bifemoral bypass graft. Both the femoropopliteal bypass grafts were patent. 01/31/2017 -- x-ray of the right hip and pelvis -- IMPRESSION:No radiographic evidence of acute osteomyelitis. Normal-appearing right hip joint space for age. No acute bony abnormality of the hip. 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 Since seeing her last week she has not had an appointment with infectious disease or the orthopedic specialist yet. 02/06/2017 -- the patient missed a couple of appointments yesterday due to the weather but other than that has apparently given up smoking for the last 5 days. 02/13/2017 -- she has rescheduled her infectious disease appointment and also the appointment with the orthopedic surgeon Dr. Sharol Given Objective Constitutional Pulse regular. Respirations normal and unlabored. Afebrile. Vitals Time Taken: 10:30 AM, Height: 67 in, Weight: 137 lbs, BMI: 21.5, Temperature: 97.7 F, Pulse: 92 bpm, Respiratory Rate: 17 breaths/min, Blood Pressure: 93/56 mmHg. Eyes Nonicteric. Reactive to light. Ears, Nose, Mouth, and Throat Lips, teeth, and gums WNL.Marland Kitchen Moist mucosa without lesions. Neck supple and nontender. No palpable supraclavicular or cervical adenopathy. Normal sized without goiter. Respiratory WNL. No retractions.. Cardiovascular Pedal Pulses WNL. No clubbing, cyanosis or edema. Lymphatic No adneopathy. No adenopathy. No adenopathy. Musculoskeletal Adexa without tenderness or enlargement.. Digits and nails w/o clubbing, cyanosis, infection, petechiae, ischemia, or inflammatory conditions.Marland Kitchen KIANNI, LHEUREUX  ([id number]) Psychiatric Judgement and insight Intact.. No evidence of depression, anxiety, or agitation.. General Notes: both the wounds look very clean the one on the right lateral ankle did not need sharp debridement today. There is some tenderness over the wound and the gluteal area but will continue to use the wound VAC. Integumentary (Hair, Skin) No suspicious lesions. No crepitus or fluctuance. No peri-wound warmth or erythema. No masses.. Wound #1 status is Open. Original cause of wound was Gradually Appeared. The wound is located on the Right,Lateral Malleolus. The wound measures 1.7cm length x 2.4cm width x 0.3cm depth; 3.204cm^2 area and 0.961cm^3 volume. The wound is limited to skin breakdown. There is no tunneling noted, however, there is undermining  starting at 12:00 and ending at 12:00 with a maximum distance of 1cm. There is a large amount of serosanguineous drainage noted. The wound margin is flat and intact. There is small (1-33%) pink granulation within the wound bed. There is a large (67-100%) amount of necrotic tissue within the wound bed including Adherent Slough. The periwound skin appearance exhibited: Maceration. The periwound skin appearance did not exhibit: Callus, Crepitus, Excoriation, Induration, Rash, Scarring, Dry/Scaly, Atrophie Blanche, Cyanosis, Ecchymosis, Hemosiderin Staining, Mottled, Pallor, Rubor, Erythema. Periwound temperature was noted as No Abnormality. The periwound has tenderness on palpation. Wound #2 status is Open. Original cause of wound was Gradually Appeared. The wound is located on the Right Gluteus. The wound measures 6.2cm length x 3cm width x 3.5cm depth; 14.608cm^2 area and 51.129cm^3 volume. There is Fat Layer (Subcutaneous Tissue) Exposed exposed. There is no tunneling or undermining noted. There is a large amount of serosanguineous drainage noted. The wound margin is distinct with the outline attached to the wound base. There is large  (67-100%) pink, pale granulation within the wound bed. There is a small (1-33%) amount of necrotic tissue within the wound bed including Adherent Slough. The periwound skin appearance exhibited: Induration, Maceration. The periwound skin appearance did not exhibit: Callus, Crepitus, Excoriation, Rash, Scarring, Dry/Scaly, Atrophie Blanche, Cyanosis, Ecchymosis, Hemosiderin Staining, Mottled, Pallor, Rubor, Erythema. Periwound temperature was noted as No Abnormality. The periwound has tenderness on palpation. Assessment Active Problems ICD-10 E11.621 - Type 2 diabetes mellitus with foot ulcer L89.314 - Pressure ulcer of right buttock, stage 4 L97.312 - Non-pressure chronic ulcer of right ankle with fat layer exposed F17.218 - Nicotine dependence, cigarettes, with other nicotine-induced disorders I70.233 - Atherosclerosis of native arteries of right leg with ulceration of ankle M86.371 - Chronic multifocal osteomyelitis, right ankle and foot Raffel, Andera S. ([id number]) Plan Wound Cleansing: Wound #1 Right,Lateral Malleolus: Clean wound with Normal Saline. Wound #2 Right Gluteus: Clean wound with Normal Saline. Anesthetic: Wound #1 Right,Lateral Malleolus: Topical Lidocaine 4% cream applied to wound bed prior to debridement Wound #2 Right Gluteus: Topical Lidocaine 4% cream applied to wound bed prior to debridement - in clinic Skin Barriers/Peri-Wound Care: Wound #1 Right,Lateral Malleolus: Skin Prep - in clinic Wound #2 Right Gluteus: Skin Prep Primary Wound Dressing: Wound #1 Right,Lateral Malleolus: Santyl Ointment Wound #2 Right Gluteus: Hydrafera Blue - on granulated tissue then put the black foam over it Secondary Dressing: Wound #1 Right,Lateral Malleolus: Dry Gauze Boardered Foam Dressing - in clinic until [facility] applies VAC Wound #2 Right Gluteus: Boardered Foam Dressing - in [place] only - HHRN to replace NPWT tomorrow Dressing Change Frequency: Wound #1  Right,Lateral Malleolus: Change dressing every day. Wound #2 Right Gluteus: Change Dressing Monday, Wednesday, Friday Follow-up Appointments: Wound #1 Right,Lateral Malleolus: Return Appointment in 1 week. Wound #2 Right Gluteus: Return Appointment in 1 week. Additional Orders / Instructions: Wound #1 Right,Lateral Malleolus: Stop Smoking Increase protein intake. Activity as tolerated Other: - Please add vitamin A, vitamin C and zinc supplements to your diet Wound #2 Right Gluteus: Stop Smoking Increase protein intake. KAITLAN, BIN ([id number]) Activity as tolerated Other: - Please add vitamin A, vitamin C and zinc supplements to your diet Home Health: Wound #1 Right,Lateral Malleolus: [place] Visits - [place] Nurse may visit PRN to address patient s wound care needs. FACE TO FACE ENCOUNTER: MEDICARE and MEDICAID PATIENTS: I certify that this patient is under my care and that I had a face-to-face encounter that meets  the physician face-to-face encounter requirements with this patient on this date. The encounter with the patient was in whole or in part for the following MEDICAL CONDITION: (primary reason for [place]) MEDICAL NECESSITY: I certify, that based on my findings, NURSING services are a medically necessary home health service. HOME BOUND STATUS: I certify that my clinical findings support that this patient is homebound (i.e., Due to illness or injury, pt requires aid of supportive devices such as crutches, cane, wheelchairs, walkers, the use of special transportation or the assistance of another person to leave their place of residence. There is a normal inability to leave the home and doing so requires considerable and taxing effort. Other absences are for medical reasons / religious services and are infrequent or of short duration when for other reasons). If current dressing causes regression in wound condition, may D/C  ordered dressing product/s and apply Normal Saline Moist Dressing daily until next [place] / Other MD appointment. [place] of regression in wound condition at [phone]. Please direct any NON-WOUND related issues/requests for orders to patient's Primary Care Physician Wound #2 Right Gluteus: [place] Nurse may visit PRN to address patient s wound care needs. FACE TO FACE ENCOUNTER: MEDICARE and MEDICAID PATIENTS: I certify that this patient is under my care and that I had a face-to-face encounter that meets the physician face-to-face encounter requirements with this patient on this date. The encounter with the patient was in whole or in part for the following MEDICAL CONDITION: (primary reason for [place]) MEDICAL NECESSITY: I certify, that based on my findings, NURSING services are a medically necessary home health service. HOME BOUND STATUS: I certify that my clinical findings support that this patient is homebound (i.e., Due to illness or injury, pt requires aid of supportive devices such as crutches, cane, wheelchairs, walkers, the use of special transportation or the assistance of another person to leave their place of residence. There is a normal inability to leave the home and doing so requires considerable and taxing effort. Other absences are for medical reasons / religious services and are infrequent or of short duration when for other reasons). If current dressing causes regression in wound condition, may D/C ordered dressing product/s and apply Normal Saline Moist Dressing daily until next [place] / Other MD appointment. [place] of regression in wound condition at [phone]. Please direct any NON-WOUND related issues/requests for orders to patient's Primary Care Physician Negative Pressure Wound Therapy: Wound #2 Right Gluteus: Wound VAC settings at  125/130 mmHg continuous pressure. Use BLACK/GREEN foam to wound cavity. Use WHITE foam to fill any tunnel/s and/or undermining. Change VAC dressing 3 X WEEK. Change canister as indicated when full. Nurse may titrate settings and frequency of dressing changes as clinically indicated. - black foam to wound cavity only - use Hydrafera blue to granulated tissue Other: - CLINIC RN DID APPLIED WOUND VAC TODAY IN THE CLINIC Medications-please add to medication list.: Wound #1 Right,Lateral Malleolus: Santyl Enzymatic Ointment Sigg, Trinitie S. ([id number]) After review have recommended: 1. Santyl ointment with a packing to be applied to the right ankle, change daily 2. Hydrofera Blue to the superficial wound and then black foam of the wound VAC, to the right gluteal region to be changed 3 times a week. 3. Follow-up with Dr. Sharol Given regarding the right fibula bone biopsy. appointment pending 4. Follow up with infectious disease regarding long-term antibiotics. appointment pending  5. need to completely give up smoking -- I have commended on being off cigarettes for the last 15 days 6. adequate protein, vitamin A, vitamin C and zinc 7. arterial surgery has been put off till her other issues are resolved She and husband have read all questions answered and will be compliant Electronic Signature(s) Signed: 02/13/2017 11:32:24 AM By: Christin Fudge MD, FACS Entered By: Christin Fudge on 02/13/2017 11:32:23 Tesoro, Beatrice Chauncey Cruel ([id number]) -------------------------------------------------------------------------------- SuperBill Details Patient Name: [name]. Date of Service: 02/13/2017 Medical Record Number: [id number] Patient Account Number: [id number] Date of Birth/Sex: [birth year] (51 y.o. 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, right ankle and foot Facility Procedures CPT4 Code: [id number] Description: 36644 - WOUND VAC-50 SQ CM OR LESS Modifier: Quantity: 1 Physician Procedures CPT4 Code Description: [id number] - WC PHYS LEVEL 3 - EST PT ICD-10 Description Diagnosis 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

## 2017-02-14 NOTE — Progress Notes (Addendum)
[name], [name] ([id number]) Visit Report for 02/13/2017 Arrival Information Details Patient Name: [name], [name]. Date of Service: 02/13/2017 10:30 AM Medical Record Number: [id number] Patient Account Number: [id number] Date of Birth/Sex: 1966/07/16 (51 y.o. Female) Treating RN: Afful, RN, [name], Velva Harman Primary Care Bana Borgmeyer: Delight Stare Other Clinician: Referring Jocob Dambach: Delight Stare Treating Deleon Passe/Extender: Frann Rider in Treatment: 3 Visit Information History Since Last Visit All ordered tests and consults were completed: No Patient Arrived: Ambulatory Added or deleted any medications: No Arrival Time: 10:26 Any new allergies or adverse reactions: No Accompanied By: hubby Had a fall or experienced change in No Transfer Assistance: None activities of daily living that may affect Patient Identification Verified: Yes risk of falls: Secondary Verification Process Yes Signs or symptoms of abuse/neglect since last No Completed: visito Patient Requires Transmission-Based No Hospitalized since last visit: No Precautions: Has Dressing in Place as Prescribed: Yes Patient Has Alerts: No Pain Present Now: Yes Electronic Signature(s) Signed: 02/13/2017 10:26:47 AM By: Regan Lemming [name], RN Entered By: Regan Lemming on 02/13/2017 10:26:47 Matherly, [name] ([id number]) -------------------------------------------------------------------------------- Encounter Discharge Information Details Patient Name: [name]. Date of Service: 02/13/2017 10:30 AM Medical Record Number: [id number] Patient Account Number: [id number] Date of Birth/Sex: 04-13-1966 (51 y.o. Female) Treating RN: Afful, RN, [name], Velva Harman Primary Care Richard Holz: Delight Stare Other Clinician: Referring Brystal Kildow: Delight Stare Treating Laurissa Cowper/Extender: Frann Rider in Treatment: 3 Encounter Discharge Information Items Discharge Pain Level: 0 Discharge Condition: Stable Ambulatory Status:  Ambulatory Discharge Destination: Home Transportation: Private Auto Accompanied By: self Schedule Follow-up Appointment: No Medication Reconciliation completed and provided to Patient/Care No Janet Humphreys: Provided on Clinical Summary of Care: 02/13/2017 Form Type Recipient Paper Patient RM Electronic Signature(s) Signed: 02/14/2017 12:39:58 PM By: Regan Lemming [name], RN Previous Signature: 02/13/2017 11:27:35 AM Version By: Ruthine Dose Entered By: Regan Lemming on 02/14/2017 12:39:57 Fussell, Marliyah S. ([id number]) -------------------------------------------------------------------------------- Lower Extremity Assessment Details Patient Name: [name]. Date of Service: 02/13/2017 10:30 AM Medical Record Number: [id number] Patient Account Number: [id number] Date of Birth/Sex: [birth year] (51 y.o. Female) Treating RN: Afful, RN, [name], Velva Harman Primary Care Stephens Shreve: Delight Stare Other Clinician: Referring Thedora Rings: Delight Stare Treating Demitra Danley/Extender: Frann Rider in Treatment: 3 Edema Assessment Assessed: [Left: No] [Right: No] Edema: [Left: N] [Right: o] Vascular Assessment Claudication: Claudication Assessment [Right:None] Pulses: Dorsalis Pedis Palpable: [Right:Yes] Posterior Tibial Extremity colors, hair growth, and conditions: Extremity Color: [Right:Normal] Hair Growth on Extremity: [Right:No] Temperature of Extremity: [Right:Warm] Capillary Refill: [Right:< 3 seconds] Electronic Signature(s) Signed: 02/13/2017 4:02:33 PM By: Regan Lemming [name], RN Entered By: Regan Lemming on 02/13/2017 10:31:06 Stevison, Abbagayle Chauncey Cruel ([id number]) -------------------------------------------------------------------------------- Multi Wound Chart Details Patient Name: [name]. Date of Service: 02/13/2017 10:30 AM Medical Record Number: [id number] Patient Account Number: [id number] Date of Birth/Sex: [birth year] (51 y.o. Female) Treating RN: Baruch Gouty, RN, [name], Velva Harman Primary Care Kimbra Marcelino:  Delight Stare Other Clinician: Referring Jarrod Mcenery: Delight Stare Treating Bexley Laubach/Extender: Frann Rider in Treatment: 3 Vital Signs Height(in): 67 Pulse(bpm): 92 Weight(lbs): 137 Blood Pressure 93/56 (mmHg): Body Mass Index(BMI): 21 Temperature(F): 97.7 Respiratory Rate 17 (breaths/min): Photos: [1:No Photos] [2:No Photos] [N/A:N/A] Wound Location: [1:Right Malleolus - Lateral] [2:Right Gluteus] [N/A:N/A] Wounding Event: [1:Gradually Appeared] [2:Gradually Appeared] [N/A:N/A] Primary Etiology: [1:Diabetic Wound/Ulcer of the Lower Extremity] [2:Open Surgical Wound] [N/A:N/A] Comorbid History: [1:Anemia, Arrhythmia, Coronary Artery Disease, Hypotension, Peripheral Arterial Disease, Type II Diabetes, Osteoarthritis] [2:Anemia, Arrhythmia, Coronary Artery Disease, Hypotension, Peripheral Arterial Disease, Type II Diabetes,  Osteoarthritis] [N/A:N/A] Date Acquired: [1:09/23/2016] [2:09/18/2016] [N/A:N/A] Weeks of Treatment: [1:3] [2:3] [  N/A:N/A] Wound Status: [1:Open] [2:Open] [N/A:N/A] Measurements L x W x D 1.7x2.4x0.3 [2:6.2x3x3.5] [N/A:N/A] (cm) Area (cm) : [1:3.204] [2:14.608] [N/A:N/A] Volume (cm) : [1:0.961] [2:51.129] [N/A:N/A] % Reduction in Area: [1:18.40%] [2:24.00%] [N/A:N/A] % Reduction in Volume: 51.00% [2:46.80%] [N/A:N/A] Starting Position 1 12 (o'clock): Ending Position 1 [1:12] (o'clock): Maximum Distance 1 1 (cm): Undermining: [1:Yes] [2:No] [N/A:N/A] Classification: [1:Grade 2] [2:Full Thickness Without Exposed Support Structures] [N/A:N/A] Exudate Amount: [1:Large] [2:Large] [N/A:N/A] Exudate Type: Serosanguineous Serosanguineous N/A Exudate Color: red, brown red, brown N/A Wound Margin: Flat and Intact Distinct, outline attached N/A Granulation Amount: Small (1-33%) Large (67-100%) N/A Granulation Quality: Pink Pink, Pale N/A Necrotic Amount: Large (67-100%) Small (1-33%) N/A Exposed Structures: Fascia: No Fat Layer (Subcutaneous N/A Fat  Layer (Subcutaneous Tissue) Exposed: Yes Tissue) Exposed: No Fascia: No Tendon: No Tendon: No Muscle: No Muscle: No Joint: No Joint: No Bone: No Bone: No Limited to Skin Breakdown Epithelialization: None None N/A Periwound Skin Texture: Excoriation: No Induration: Yes N/A Induration: No Excoriation: No Callus: No Callus: No Crepitus: No Crepitus: No Rash: No Rash: No Scarring: No Scarring: No Periwound Skin Maceration: Yes Maceration: Yes N/A Moisture: Dry/Scaly: No Dry/Scaly: No Periwound Skin Color: Atrophie Blanche: No Atrophie Blanche: No N/A Cyanosis: No Cyanosis: No Ecchymosis: No Ecchymosis: No Erythema: No Erythema: No Hemosiderin Staining: No Hemosiderin Staining: No Mottled: No Mottled: No Pallor: No Pallor: No Rubor: No Rubor: No Temperature: No Abnormality No Abnormality N/A Tenderness on Yes Yes N/A Palpation: Wound Preparation: Ulcer Cleansing: Ulcer Cleansing: N/A Rinsed/Irrigated with Rinsed/Irrigated with Saline Saline Topical Anesthetic Topical Anesthetic Applied: Other: Lidocaine Applied: Other: lidocaine 4% 4% Treatment Notes Electronic Signature(s) Signed: 02/13/2017 11:29:58 AM By: Christin Fudge MD, FACS Entered By: Christin Fudge on 02/13/2017 11:29:58 Cogbill, STACEE EARP ([id number]) Kocher, Leonetta Chauncey Cruel ([id number]) -------------------------------------------------------------------------------- [place] Details Patient Name: NAARAH, BORGERDING. Date of Service: 02/13/2017 10:30 AM Medical Record Number: [id number] Patient Account Number: [id number] Date of Birth/Sex: [birth year] (51 y.o. Female) Treating RN: Afful, RN, [name], [employer] Primary Care Ronique Simerly: Delight Stare Other Clinician: Referring Milyn Stapleton: Delight Stare Treating Jeilyn Reznik/Extender: Frann Rider in Treatment: 3 Active Inactive ` Orientation to the Wound Care Program Nursing Diagnoses: Knowledge deficit related to the wound healing center  program Goals: Patient/caregiver will verbalize understanding of the [place] [facility] Program Date Initiated: 01/23/2017 Target Resolution Date: 05/25/2017 Goal Status: Active Interventions: Provide education on orientation to the wound center Notes: ` Peripheral Neuropathy Nursing Diagnoses: Knowledge deficit related to disease process and management of peripheral neurovascular dysfunction Potential alteration in peripheral tissue perfusion (select prior to confirmation of diagnosis) Goals: Patient/caregiver will verbalize understanding of disease process and disease management Date Initiated: 01/23/2017 Target Resolution Date: 05/25/2017 Goal Status: Active Interventions: Assess signs and symptoms of neuropathy upon admission and as needed Provide education on Management of Neuropathy and Related Ulcers Provide education on Management of Neuropathy upon discharge from the [facility] Activities: Test ordered outside of clinic : 01/23/2017 Notes: EARNESTEEN, BIRNIE ([id number]) ` Pressure Nursing Diagnoses: Knowledge deficit related to causes and risk factors for pressure ulcer development Knowledge deficit related to management of pressures ulcers Potential for impaired tissue integrity related to pressure, friction, moisture, and shear Goals: Patient will remain free from development of additional pressure ulcers Date Initiated: 01/23/2017 Target Resolution Date: 05/25/2017 Goal Status: Active Patient will remain free of pressure ulcers Date Initiated: 01/23/2017 Target Resolution Date: 05/25/2017 Goal Status: Active Patient/caregiver will verbalize risk factors for pressure ulcer development Date Initiated: 01/23/2017 Target Resolution Date:  05/25/2017 Goal Status: Active Patient/caregiver will verbalize understanding of pressure ulcer management Date Initiated: 01/23/2017 Target Resolution Date: 05/25/2017 Goal Status: Active Interventions: Assess: immobility, friction,  shearing, incontinence upon admission and as needed Assess offloading mechanisms upon admission and as needed Assess potential for pressure ulcer upon admission and as needed Provide education on pressure ulcers Treatment Activities: Patient referred for pressure reduction/relief devices : 01/23/2017 Patient referred for seating evaluation to ensure proper offloading : 01/23/2017 Pressure reduction/relief device ordered : 01/23/2017 Notes: ` Wound/Skin Impairment Nursing Diagnoses: Impaired tissue integrity Knowledge deficit related to smoking impact on wound healing Knowledge deficit related to ulceration/compromised skin integrity Goals: Patient/caregiver will verbalize understanding of skin care regimen CHERRILL, SCRIMA ([id number]) Date Initiated: 01/23/2017 Target Resolution Date: 05/25/2017 Goal Status: Active Ulcer/skin breakdown will have a volume reduction of 30% by week 4 Date Initiated: 01/23/2017 Target Resolution Date: 05/25/2017 Goal Status: Active Ulcer/skin breakdown will have a volume reduction of 50% by week 8 Date Initiated: 01/23/2017 Target Resolution Date: 05/25/2017 Goal Status: Active Ulcer/skin breakdown will have a volume reduction of 80% by week 12 Date Initiated: 01/23/2017 Target Resolution Date: 05/25/2017 Goal Status: Active Ulcer/skin breakdown will heal within 14 weeks Date Initiated: 01/23/2017 Target Resolution Date: 05/25/2017 Goal Status: Active Interventions: Assess patient/caregiver ability to obtain necessary supplies Assess patient/caregiver ability to perform ulcer/skin care regimen upon admission and as needed Assess ulceration(s) every visit Provide education on smoking Provide education on ulcer and skin care Treatment Activities: Patient referred to home care : 01/23/2017 Referred to DME Mikhala Kenan for dressing supplies : 01/23/2017 Skin care regimen initiated : 01/23/2017 Topical wound management initiated : 01/23/2017 Notes: Electronic Signature(s) Signed:  02/13/2017 4:02:33 PM By: Regan Lemming [name], RN Entered By: Regan Lemming on 02/13/2017 10:42:46 Emry, Agnieszka Chauncey Cruel ([id number]) -------------------------------------------------------------------------------- Pain Assessment Details Patient Name: [name]. Date of Service: 02/13/2017 10:30 AM Medical Record Number: [id number] Patient Account Number: [id number] Date of Birth/Sex: 1966-08-05 (51 y.o. Female) Treating RN: Afful, RN, [name], Velva Harman Primary Care Newell Frater: Delight Stare Other Clinician: Referring Glenmore Karl: Delight Stare Treating Jamesa Tedrick/Extender: Frann Rider in Treatment: 3 Active Problems Location of Pain Severity and Description of Pain Patient Has Paino Yes Site Locations Pain Location: Pain in Ulcers Rate the pain. Current Pain Level: 5 Character of Pain Describe the Pain: Aching, Tender Pain Management and Medication Current Pain Management: Electronic Signature(s) Signed: 02/13/2017 10:27:04 AM By: Regan Lemming [name], RN Entered By: Regan Lemming on 02/13/2017 10:27:04 Stephens, [name] ([id number]) -------------------------------------------------------------------------------- Patient/Caregiver Education Details Patient Name: ALLIYAH, ROESLER. Date of Service: 02/13/2017 10:30 AM Medical Record Number: [id number] Patient Account Number: [id number] Date of Birth/Gender: Apr 14, 1966 (51 y.o. Female) Treating RN: Afful, RN, [name], Velva Harman Primary Care Physician: Delight Stare Other Clinician: Referring Physician: Delight Stare Treating Physician/Extender: Frann Rider in Treatment: 3 Education Assessment Education Provided To: Patient Education Topics Provided Peripheral Neuropathy: Methods: Explain/Verbal Responses: State content correctly Pressure: Methods: Explain/Verbal Responses: State content correctly Smoking and Wound Healing: Methods: Explain/Verbal Responses: State content correctly Welcome To [facility]: Methods:  Explain/Verbal Responses: State content correctly Wound/Skin Impairment: Methods: Explain/Verbal Responses: State content correctly Electronic Signature(s) Signed: 02/14/2017 4:21:12 PM By: Regan Lemming [name], RN Entered By: Regan Lemming on 02/14/2017 12:40:23 Antonini, Trish Chauncey Cruel ([id number]) -------------------------------------------------------------------------------- Wound Assessment Details Patient Name: [name]. Date of Service: 02/13/2017 10:30 AM Medical Record Number: [id number] Patient Account Number: [id number] Date of Birth/Sex: 04/23/1966 (51 y.o. Female) Treating RN: Afful, RN, [name], [employer] Primary Care Clemente Dewey: Delight Stare Other Clinician:  Referring Jeramiah Mccaughey: Delight Stare Treating Zaniel Marineau/Extender: Frann Rider in Treatment: 3 Wound Status Wound Number: 1 Primary Diabetic Wound/Ulcer of the Lower Etiology: Extremity Wound Location: Right Malleolus - Lateral Wound Open Wounding Event: Gradually Appeared Status: Date Acquired: 09/23/2016 Comorbid Anemia, Arrhythmia, Coronary Artery Weeks Of Treatment: 3 History: Disease, Hypotension, Peripheral Clustered Wound: No Arterial Disease, Type II Diabetes, Osteoarthritis Photos Photo Uploaded By: Regan Lemming on 02/13/2017 15:59:34 Wound Measurements Length: (cm) 1.7 Width: (cm) 2.4 Depth: (cm) 0.3 Area: (cm) 3.204 Volume: (cm) 0.961 % Reduction in Area: 18.4% % Reduction in Volume: 51% Epithelialization: None Tunneling: No Undermining: Yes Starting Position (o'clock): 12 Ending Position (o'clock): 12 Maximum Distance: (cm) 1 Wound Description Classification: Grade 2 Wound Margin: Flat and Intact Exudate Amount: Large Exudate Type: Serosanguineous Exudate Color: red, brown Zent, Kassia S. ([id number]) Foul Odor After Cleansing: No Slough/Fibrino Yes Wound Bed Granulation Amount: Small (1-33%) Exposed Structure Granulation Quality: Pink Fascia Exposed: No Necrotic Amount: Large (67-100%) Fat  Layer (Subcutaneous Tissue) Exposed: No Necrotic Quality: Adherent Slough Tendon Exposed: No Muscle Exposed: No Joint Exposed: No Bone Exposed: No Limited to Skin Breakdown Periwound Skin Texture Texture Color No Abnormalities Noted: No No Abnormalities Noted: No Callus: No Atrophie Blanche: No Crepitus: No Cyanosis: No Excoriation: No Ecchymosis: No Induration: No Erythema: No Rash: No Hemosiderin Staining: No Scarring: No Mottled: No Pallor: No Moisture Rubor: No No Abnormalities Noted: No Dry / Scaly: No Temperature / Pain Maceration: Yes Temperature: No Abnormality Tenderness on Palpation: Yes Wound Preparation Ulcer Cleansing: Rinsed/Irrigated with Saline Topical Anesthetic Applied: Other: Lidocaine 4%, Treatment Notes Wound #1 (Right, Lateral Malleolus) 1. Cleansed with: Clean wound with Normal Saline 4. Dressing Applied: Santyl Ointment 5. Secondary Dressing Applied Bordered Foam Dressing Dry Gauze 8. Negative Pressure Wound Therapy Wound Vac to wound continuously at 128mm/hg pressure Black Foam Electronic Signature(s) Signed: 02/13/2017 4:02:33 PM By: Regan Lemming [name], RN Entered By: Regan Lemming on 02/13/2017 10:38:43 Cassels, Mahati S. ([id number]) Lansberry, Allee Chauncey Cruel ([id number]) -------------------------------------------------------------------------------- Wound Assessment Details Patient Name: Weins, [name]. Date of Service: 02/13/2017 10:30 AM Medical Record Number: [id number] Patient Account Number: [id number] Date of Birth/Sex: 12/09/1965 (51 y.o. Female) Treating RN: Afful, RN, [name], [employer] Primary Care Wolfe Camarena: Delight Stare Other Clinician: Referring Merle Cirelli: Delight Stare Treating Trinady Milewski/Extender: Frann Rider in Treatment: 3 Wound Status Wound Number: 2 Primary Open Surgical Wound Etiology: Wound Location: Right Gluteus Wound Open Wounding Event: Gradually Appeared Status: Date Acquired: 09/18/2016 Comorbid Anemia, Arrhythmia,  Coronary Artery Weeks Of Treatment: 3 History: Disease, Hypotension, Peripheral Clustered Wound: No Arterial Disease, Type II Diabetes, Osteoarthritis Photos Photo Uploaded By: Regan Lemming on 02/13/2017 15:59:57 Wound Measurements Length: (cm) 6.2 Width: (cm) 3 Depth: (cm) 3.5 Area: (cm) 14.608 Volume: (cm) 51.129 % Reduction in Area: 24% % Reduction in Volume: 46.8% Epithelialization: None Tunneling: No Undermining: No Wound Description Full Thickness Without Exposed Classification: Support Structures Wound Margin: Distinct, outline attached Exudate Large Amount: Exudate Type: Serosanguineous Exudate Color: red, brown Foul Odor After Cleansing: No Slough/Fibrino Yes Wound Bed Granulation Amount: Large (67-100%) Exposed Structure Brumbaugh, Ripley S. ([id number]) Granulation Quality: Pink, Pale Fascia Exposed: No Necrotic Amount: Small (1-33%) Fat Layer (Subcutaneous Tissue) Exposed: Yes Necrotic Quality: Adherent Slough Tendon Exposed: No Muscle Exposed: No Joint Exposed: No Bone Exposed: No Periwound Skin Texture Texture Color No Abnormalities Noted: No No Abnormalities Noted: No Callus: No Atrophie Blanche: No Crepitus: No Cyanosis: No Excoriation: No Ecchymosis: No Induration: Yes Erythema: No Rash: No Hemosiderin Staining: No Scarring: No Mottled: No Pallor:  No Moisture Rubor: No No Abnormalities Noted: No Dry / Scaly: No Temperature / Pain Maceration: Yes Temperature: No Abnormality Tenderness on Palpation: Yes Wound Preparation Ulcer Cleansing: Rinsed/Irrigated with Saline Topical Anesthetic Applied: Other: lidocaine 4%, Treatment Notes Wound #2 (Right Gluteus) 1. Cleansed with: Clean wound with Normal Saline 4. Dressing Applied: Santyl Ointment 5. Secondary Dressing Applied Bordered Foam Dressing Dry Gauze 8. Negative Pressure Wound Therapy Wound Vac to wound continuously at 134mm/hg pressure Black Foam Electronic  Signature(s) Signed: 02/13/2017 4:02:33 PM By: Regan Lemming [name], RN Entered By: Regan Lemming on 02/13/2017 10:41:34 Sanda, [name] ([id number]) -------------------------------------------------------------------------------- Vitals Details Patient Name: [name]. Date of Service: 02/13/2017 10:30 AM Medical Record Number: [id number] Patient Account Number: [id number] Date of Birth/Sex: 10-28-1966 (51 y.o. Female) Treating RN: Afful, RN, [name], Velva Harman Primary Care Yamil Oelke: Delight Stare Other Clinician: Referring Libbi Towner: Delight Stare Treating Barnie Sopko/Extender: Frann Rider in Treatment: 3 Vital Signs Time Taken: 10:30 Temperature (F): 97.7 Height (in): 67 Pulse (bpm): 92 Weight (lbs): 137 Respiratory Rate (breaths/min): 17 Body Mass Index (BMI): 21.5 Blood Pressure (mmHg): 93/56 Reference Range: 80 - 120 mg / dl Electronic Signature(s) Signed: 02/13/2017 4:02:33 PM By: Regan Lemming [name], RN Entered By: Regan Lemming on 02/13/2017 10:33:25

## 2017-02-20 ENCOUNTER — Encounter: Payer: Medicare Other | Attending: Surgery | Admitting: Surgery

## 2017-02-20 DIAGNOSIS — M86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, right ankle and foot: Secondary | ICD-10-CM | POA: Diagnosis not present

## 2017-02-20 DIAGNOSIS — E1151 Type 2 diabetes mellitus with diabetic peripheral angiopathy without gangrene: Secondary | ICD-10-CM | POA: Insufficient documentation

## 2017-02-20 DIAGNOSIS — L97312 Non-pressure chronic ulcer of right ankle with fat layer exposed: Secondary | ICD-10-CM | POA: Diagnosis not present

## 2017-02-20 DIAGNOSIS — I2510 Atherosclerotic heart disease of native coronary artery without angina pectoris: Secondary | ICD-10-CM | POA: Insufficient documentation

## 2017-02-20 DIAGNOSIS — E11621 Type 2 diabetes mellitus with foot ulcer: Secondary | ICD-10-CM | POA: Insufficient documentation

## 2017-02-20 DIAGNOSIS — D649 Anemia, unspecified: Secondary | ICD-10-CM | POA: Diagnosis not present

## 2017-02-20 DIAGNOSIS — M1990 Unspecified osteoarthritis, unspecified site: Secondary | ICD-10-CM | POA: Insufficient documentation

## 2017-02-20 DIAGNOSIS — Z880 Allergy status to penicillin: Secondary | ICD-10-CM | POA: Insufficient documentation

## 2017-02-20 DIAGNOSIS — I70233 Atherosclerosis of native arteries of right leg with ulceration of ankle: Secondary | ICD-10-CM | POA: Insufficient documentation

## 2017-02-20 DIAGNOSIS — F17218 Nicotine dependence, cigarettes, with other nicotine-induced disorders: Secondary | ICD-10-CM | POA: Diagnosis not present

## 2017-02-20 DIAGNOSIS — L89314 Pressure ulcer of right buttock, stage 4: Secondary | ICD-10-CM | POA: Diagnosis not present

## 2017-02-21 NOTE — Progress Notes (Signed)
[name], [name] ([id number]) Visit Report for 02/20/2017 Chief Complaint Document Details Patient Name: [name], [name]. Date of Service: 02/20/2017 2:00 PM Medical Record Number: [id number] Patient Account Number: [id number] Date of Birth/Sex: [birth year] (51 y.o. Female) Treating RN: Afful, RN, [name], Velva Harman Primary Care Provider: Delight Stare Other Clinician: Referring Provider: Delight Stare Treating Provider/Extender: Frann Rider in Treatment: 4 Information Obtained from: Patient Chief Complaint Patients presents for treatment of an open diabetic ulcer with an arterial etiology on her right lateral ankle and right gluteal area for about 4 months Electronic Signature(s) Signed: 02/20/2017 2:33:24 PM By: Christin Fudge MD, FACS Entered By: Christin Fudge on 02/20/2017 14:33:23 [name], [name] ([id number]) -------------------------------------------------------------------------------- Debridement Details Patient Name: [name]. Date of Service: 02/20/2017 2:00 PM Medical Record Number: [id number] Patient Account Number: [id number] Date of Birth/Sex: Mar 26, 1966 (51 y.o. Female) Treating RN: Afful, RN, [name], Velva Harman Primary Care Provider: Delight Stare Other Clinician: Referring Provider: Delight Stare Treating Provider/Extender: Frann Rider in Treatment: 4 Debridement Performed for Wound #1 Right,Lateral Malleolus Assessment: Performed By: Physician Christin Fudge, MD Debridement: Debridement Pre-procedure Yes - 14:31 Verification/Time Out Taken: Start Time: 14:29 Pain Control: Lidocaine 4% Topical Solution Level: Skin/Subcutaneous Tissue Total Area Debrided (L x 1.5 (cm) x 2.1 (cm) = 3.15 (cm) W): Tissue and other Non-Viable, Fibrin/Slough, Subcutaneous material debrided: Instrument: Curette Bleeding: Minimum Hemostasis Achieved: Pressure End Time: 14:32 Procedural Pain: 0 Post Procedural Pain: 0 Response to Treatment: Procedure was tolerated well Post  Debridement Measurements of Total Wound Length: (cm) 1.5 Width: (cm) 2.1 Depth: (cm) 0.3 Volume: (cm) 0.742 Character of Wound/Ulcer Post Requires Further Debridement Debridement: Severity of Tissue Post Debridement: Fat layer exposed Post Procedure Diagnosis Same as Pre-procedure Electronic Signature(s) Signed: 02/20/2017 2:33:15 PM By: Christin Fudge MD, FACS Signed: 02/20/2017 3:20:12 PM By: Regan Lemming [name], RN Entered By: Christin Fudge on 02/20/2017 14:33:14 [name], [name] ([id number]) [name], [name] ([id number]) -------------------------------------------------------------------------------- HPI Details Patient Name: [name], [name] Date of Service: 02/20/2017 2:00 PM Medical Record Number: [id number] Patient Account Number: [id number] Date of Birth/Sex: Oct 08, 1966 (51 y.o. Female) Treating RN: Afful, RN, [name], Velva Harman Primary Care Provider: Delight Stare Other Clinician: Referring Provider: Delight Stare Treating Provider/Extender: Frann Rider in Treatment: 4 History of Present Illness Location: right lateral ankle and right gluteal area Quality: Patient reports experiencing a sharp pain to affected area(s). Severity: Patient states wound are getting worse. Duration: Patient has had the wound for > 4 months prior to seeking treatment at the wound center Timing: Pain in wound is constant (hurts all the time) Context: The wound occurred when the patient was in hospital with a necrotizing fasciitis of the right gluteal area and a ulceration on the right ankle Modifying Factors: Other treatment(s) tried include:admitted to the hospital for IV antibiotics and a full workup and has also had a recent angiogram Associated Signs and Symptoms: Patient reports having increase discharge. HPI Description: 51 year old patient was sent to [place] from [facility] where she was seen by Dr. Sherril Cong for a left ankle ulceration and was recently hospitalized with hypotension and  sepsis. She was treated with IV antibiotics and has been scheduled to see Dr. Sharol Given. She was seen by vascular surgery who recommended a femoral bypass but surgery has been delayed until her sacral wound from last year's necrotizing fasciitis has healed. Was seeing the wound care team at [facility] but wanted to change over. She is a smoker and smokes a pack of cigarettes a day The patient was  recently admitted in [place] between February 2 and February 14. She had a follow- up to see vascular surgery, orthopedic surgery and infectious disease. 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 Review of the electronic medical records reveals that Dr. 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 An MRI showed edema in the bone which was consistent with osteomyelitis and the chronic ulcer and may need surgical intervention. She also had a sacral decubitus ulcer which was treated with the wound VAC. On 01/07/2017 she was taken up by the vascular surgeon Dr. Trula Slade for an abdominal aortogram and bilateral lower extremity runoff, for a history of having bilateral femoropopliteal bypass graft as well as external iliac stenting on the right and stenting of her bypass graft. She had developed a nonhealing wound on her right ankle and there was a possibility of a femoral occlusion. the findings were noted and the impression was a surgical revascularization with a aorto bifemoral bypass graft. Both the femoropopliteal bypass grafts were patent. 01/31/2017 -- x-ray of the right hip and  pelvis -- IMPRESSION:No radiographic evidence of acute osteomyelitis. Normal-appearing right hip joint space for age. No acute bony abnormality of the hip. 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 Since seeing her last week she has not had an appointment with infectious disease or the orthopedic specialist yet. [name], [name] ([id number]) 02/06/2017 -- the patient missed a couple of appointments yesterday due to the weather but other than that has apparently given up smoking for the last 5 days. 02/13/2017 -- she has rescheduled her infectious disease appointment and also the appointment with the orthopedic surgeon Dr. Sharol Given Electronic Signature(s) Signed: 02/20/2017 2:33:32 PM By: Christin Fudge MD, FACS Entered By: Christin Fudge on 02/20/2017 14:33:32 [name], [name] ([id number]) -------------------------------------------------------------------------------- Physical Exam Details Patient Name: SHIANNE, ZEISER S. Date of Service: 02/20/2017 2:00 PM Medical Record Number: [id number] Patient Account Number: [id number] Date of Birth/Sex: 01-17-1966 (51 y.o. Female) Treating RN: Baruch Gouty, RN, [name], [employer] Primary Care Provider: Delight Stare Other Clinician: Referring Provider: Delight Stare Treating Provider/Extender: Frann Rider in Treatment: 4 Constitutional . Pulse regular. Respirations normal and unlabored. Afebrile. . Eyes Nonicteric. Reactive to light. Ears, Nose, Mouth, and Throat Lips, teeth, and gums WNL.Marland Kitchen Moist mucosa without lesions. Neck supple and nontender. No palpable supraclavicular or cervical adenopathy. Normal sized without goiter. Respiratory WNL. No retractions.. Breath sounds WNL, No rubs, rales, rhonchi, or wheeze.. Cardiovascular Heart rhythm and rate regular, no murmur or gallop.. Pedal Pulses WNL. No clubbing, cyanosis or edema. Lymphatic No adneopathy. No adenopathy. No  adenopathy. Musculoskeletal Adexa without tenderness or enlargement.. Digits and nails w/o clubbing, cyanosis, infection, petechiae, ischemia, or inflammatory conditions.. Integumentary (Hair, Skin) No suspicious lesions. No crepitus or fluctuance. No peri-wound warmth or erythema. No masses.Marland Kitchen Psychiatric Judgement and insight Intact.. No evidence of depression, anxiety, or agitation.. Notes the wound on the right ankle needed sharp debridement and once some of the slough has been removed there is healthy granulation tissue under this and it does not probe down to bone. The right gluteal ulceration has  excellent granulation tissue in the upper half of the wound and in the lower half she has got some depth to it towards the ischial tuberosity but the bone is covered with granulation tissue Electronic Signature(s) Signed: 02/20/2017 2:34:22 PM By: Christin Fudge MD, FACS Entered By: Christin Fudge on 02/20/2017 14:34:21 [name], [name] ([id number]) -------------------------------------------------------------------------------- Physician Orders Details Patient Name: [name]. Date of Service: 02/20/2017 2:00 PM Medical Record Number: [id number] Patient Account Number: [id number] Date of Birth/Sex: 12-15-1965 (51 y.o. Female) Treating RN: Afful, RN, [name], Velva Harman Primary Care Provider: Delight Stare Other Clinician: Referring Provider: Delight Stare Treating Provider/Extender: Frann Rider in Treatment: 4 Verbal / Phone Orders: No Diagnosis Coding Wound Cleansing Wound #1 Right,Lateral Malleolus o Clean wound with Normal Saline. Wound #2 Right Gluteus o Clean wound with Normal Saline. Anesthetic Wound #1 Right,Lateral Malleolus o Topical Lidocaine 4% cream applied to wound bed prior to debridement Wound #2 Right Gluteus o Topical Lidocaine 4% cream applied to wound bed prior to debridement - in clinic Skin Barriers/Peri-Wound Care Wound #1 Right,Lateral Malleolus o  Skin Prep - in clinic Wound #2 Right Gluteus o Skin Prep Primary Wound Dressing Wound #1 Right,Lateral Malleolus o Santyl Ointment Wound #2 Right Gluteus o Hydrafera Blue - on granulated tissue then put the black foam over it Secondary Dressing Wound #1 Right,Lateral Malleolus o Dry Gauze o Boardered Foam Dressing - in clinic until [facility] applies VAC Wound #2 Right Gluteus o Boardered Foam Dressing - in [facility] only - HHRN to replace NPWT tomorrow [name], [name] ([id number]) Dressing Change Frequency Wound #1 Right,Lateral Malleolus o Change dressing every day. Wound #2 Right Gluteus o Change Dressing Monday, Wednesday, Friday Follow-up Appointments Wound #1 Right,Lateral Malleolus o Return Appointment in 1 week. Wound #2 Right Gluteus o Return Appointment in 1 week. Additional Orders / Instructions Wound #1 Right,Lateral Malleolus o Stop Smoking o Increase protein intake. o Activity as tolerated o Other: - Please add vitamin A, vitamin C and zinc supplements to your diet Wound #2 Right Gluteus o Stop Smoking o Increase protein intake. o Activity as tolerated o Other: - Please add vitamin A, vitamin C and zinc supplements to your diet Home Health Wound #1 [place] Visits - [place] Nurse may visit PRN to address patientos wound care needs. o FACE TO FACE ENCOUNTER: MEDICARE and MEDICAID PATIENTS: I certify that this patient is under my care and that I had a face-to-face encounter that meets the physician face-to-face encounter requirements with this patient on this date. The encounter with the patient was in whole or in part for the following MEDICAL CONDITION: (primary reason for [place]) MEDICAL NECESSITY: I certify, that based on my findings, NURSING services are a medically necessary home health service. HOME BOUND STATUS: I certify that my clinical  findings support that this patient is homebound (i.e., Due to illness or injury, pt requires aid of supportive devices such as crutches, cane, wheelchairs, walkers, the use of special transportation or the assistance of another person to leave their place of residence. There is a normal inability to leave the home and doing so requires considerable and taxing effort. Other absences are for medical reasons / religious services and are infrequent or of short duration when for other reasons). o If current dressing causes regression in wound condition, may D/C ordered dressing product/s and apply Normal Saline Moist Dressing daily until next [facility] / Other MD appointment. Notify  [facility] of regression in wound condition at [phone]. o Please direct any NON-WOUND related issues/requests for orders to patient's Primary Care Physician [name], [name] ([id number]) Wound #2 Right [place] Visits - [facility] Nurse may visit PRN to address patientos wound care needs. o FACE TO FACE ENCOUNTER: MEDICARE and MEDICAID PATIENTS: I certify that this patient is under my care and that I had a face-to-face encounter that meets the physician face-to-face encounter requirements with this patient on this date. The encounter with the patient was in whole or in part for the following MEDICAL CONDITION: (primary reason for [place]) MEDICAL NECESSITY: I certify, that based on my findings, NURSING services are a medically necessary home health service. HOME BOUND STATUS: I certify that my clinical findings support that this patient is homebound (i.e., Due to illness or injury, pt requires aid of supportive devices such as crutches, cane, wheelchairs, walkers, the use of special transportation or the assistance of another person to leave their place of residence. There is a normal inability to leave the home and doing so  requires considerable and taxing effort. Other absences are for medical reasons / religious services and are infrequent or of short duration when for other reasons). o If current dressing causes regression in wound condition, may D/C ordered dressing product/s and apply Normal Saline Moist Dressing daily until next [place] / Other MD appointment. [place] of regression in wound condition at [phone]. o Please direct any NON-WOUND related issues/requests for orders to patient's Primary Care Physician Negative Pressure Wound Therapy Wound #2 Right Gluteus o Wound VAC settings at 125/130 mmHg continuous pressure. Use BLACK/GREEN foam to wound cavity. Use WHITE foam to fill any tunnel/s and/or undermining. Change VAC dressing 3 X WEEK. Change canister as indicated when full. Nurse may titrate settings and frequency of dressing changes as clinically indicated. - black foam to wound cavity only - use Hydrafera blue to granulated tissue Medications-please add to medication list. Wound #1 Right,Lateral Malleolus o Santyl Enzymatic Ointment Electronic Signature(s) Signed: 02/20/2017 3:20:12 PM By: Regan Lemming [name], RN Signed: 02/20/2017 4:30:26 PM By: Christin Fudge MD, FACS Entered By: Regan Lemming on 02/20/2017 14:30:08 [name], Arasely Chauncey [name] ([id number]) -------------------------------------------------------------------------------- Problem List Details Patient Name: MAREESA, GATHRIGHT. Date of Service: 02/20/2017 2:00 PM Medical Record Number: [id number] Patient Account Number: [id number] Date of Birth/Sex: 09-25-1966 (51 y.o. 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, stage 4 01/23/2017 Yes L97.312 Non-pressure chronic ulcer of right ankle with fat layer 01/23/2017 Yes exposed F17.218 Nicotine dependence, cigarettes, with other nicotine- 01/23/2017 Yes induced disorders I70.233 Atherosclerosis of native arteries of right leg with 01/23/2017 Yes ulceration of ankle M86.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, right ankle and foot 01/23/2017 Yes Inactive Problems Resolved Problems Electronic Signature(s) Signed: 02/20/2017 2:32:52 PM By: Christin Fudge MD, FACS Entered By: Christin Fudge on 02/20/2017 14:32:51 Samano, Alazne Chauncey [name] ([id number]) -------------------------------------------------------------------------------- Progress Note Details Patient Name: [name]. Date of Service: 02/20/2017 2:00 PM Medical Record Number: [id number] Patient Account Number: [id number] Date of Birth/Sex: [birth year] (51 y.o. Female) Treating RN: Afful, RN, [name], [employer] Primary Care Provider: Delight Stare Other Clinician: Referring Provider: Delight Stare Treating Provider/Extender: Frann Rider in  Treatment: 4 Subjective Chief Complaint Information obtained from Patient Patients presents for treatment of an open diabetic ulcer with an arterial etiology on her right lateral ankle and right gluteal area for about 4 months History of Present Illness (HPI) The following HPI elements were documented for the patient's wound: Location: right lateral ankle and right gluteal area Quality: Patient reports experiencing a sharp pain to affected area(s). Severity: Patient states wound are getting worse. Duration: Patient has had the wound for > 4 months prior to seeking treatment at the wound center Timing: Pain in wound is constant (hurts all the time) Context: The wound occurred when the patient was in hospital with a necrotizing fasciitis of the right gluteal area and a ulceration on the right ankle Modifying Factors: Other treatment(s) tried  include:admitted to the hospital for IV antibiotics and a full workup and has also had a recent angiogram Associated Signs and Symptoms: Patient reports having increase discharge. 51 year old patient was sent to [place] from [facility] where she was seen by Dr. Sherril Cong for a left ankle ulceration and was recently hospitalized with hypotension and sepsis. She was treated with IV antibiotics and has been scheduled to see Dr. Sharol Given. She was seen by vascular surgery who recommended a femoral bypass but surgery has been delayed until her sacral wound from last year's necrotizing fasciitis has healed. Was seeing the wound care team at [facility] but wanted to change over. She is a smoker and smokes a pack of cigarettes a day The patient was recently admitted in [place] between February 2 and February 14. She had a follow- up to see vascular surgery, orthopedic surgery and infectious disease. 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 Review of the electronic medical records reveals that Dr. 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 An MRI showed edema in the bone which was consistent with osteomyelitis and the chronic ulcer and may need surgical intervention. She also had a sacral decubitus ulcer which was treated with the wound VAC. On 01/07/2017 she was taken up by the vascular surgeon Dr. Trula Slade for an abdominal aortogram and bilateral lower extremity runoff, for a history of having bilateral femoropopliteal bypass graft as well as external iliac stenting on  the right and stenting of her bypass graft. She had developed a nonhealing wound on her right ankle and there was a possibility of a femoral occlusion. the findings were noted and the COREENA, RUBALCAVA. ([id number]) impression was a surgical revascularization with a aorto bifemoral bypass graft. Both the femoropopliteal bypass grafts were patent. 01/31/2017 -- x-ray of the right hip and pelvis -- IMPRESSION:No radiographic evidence of acute osteomyelitis. Normal-appearing right hip joint space for age. No acute bony abnormality of the hip. 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 Since seeing her last week she has not had an appointment with infectious disease or the orthopedic specialist yet. 02/06/2017 -- the patient missed a couple of appointments yesterday due to the weather but other than that has apparently given up smoking for the last 5 days. 02/13/2017 --  she has rescheduled her infectious disease appointment and also the appointment with the orthopedic surgeon Dr. Sharol Given Objective Constitutional Pulse regular. Respirations normal and unlabored. Afebrile. Vitals Time Taken: 2:05 PM, Height: 67 in, Weight: 137 lbs, BMI: 21.5, Temperature: 97.5 F, Pulse: 99 bpm, Respiratory Rate: 17 breaths/min, Blood Pressure: 78/52 mmHg. General Notes: Patient asymptomatic. denies dizziness and she said she hasn't taken her mirilone Eyes Nonicteric. Reactive to light. Ears, Nose, Mouth, and Throat Lips, teeth, and gums WNL.Marland Kitchen Moist mucosa without lesions. Neck supple and nontender. No palpable supraclavicular or cervical adenopathy. Normal sized without goiter. Respiratory WNL. No retractions.. Breath sounds WNL, No rubs, rales, rhonchi, or wheeze.. Cardiovascular Heart rhythm and rate regular, no murmur or gallop.. Pedal Pulses WNL. No clubbing, cyanosis or edema. Lymphatic No adneopathy. No adenopathy. No  adenopathy. Musculoskeletal Adexa without tenderness or enlargement.. Digits and nails w/o clubbing, cyanosis, infection, petechiae, ischemia, or inflammatory conditions.Marland Kitchen TISHA, CLINE ([id number]) Psychiatric Judgement and insight Intact.. No evidence of depression, anxiety, or agitation.. General Notes: the wound on the right ankle needed sharp debridement and once some of the slough has been removed there is healthy granulation tissue under this and it does not probe down to bone. The right gluteal ulceration has excellent granulation tissue in the upper half of the wound and in the lower half she has got some depth to it towards the ischial tuberosity but the bone is covered with granulation tissue Integumentary (Hair, Skin) No suspicious lesions. No crepitus or fluctuance. No peri-wound warmth or erythema. No masses.. Wound #1 status is Open. Original cause of wound was Gradually Appeared. The wound is located on the Right,Lateral Malleolus. The wound measures 1.5cm length x 2.1cm width x 0.3cm depth; 2.474cm^2 area and 0.742cm^3 volume. Wound #2 status is Open. Original cause of wound was Gradually Appeared. The wound is located on the Right Gluteus. The wound measures 6.2cm length x 3cm width x 3.2cm depth; 14.608cm^2 area and 46.747cm^3 volume. There is Fat Layer (Subcutaneous Tissue) Exposed exposed. There is no tunneling or undermining noted. There is a large amount of serosanguineous drainage noted. The wound margin is distinct with the outline attached to the wound base. There is large (67-100%) pink, pale granulation within the wound bed. There is a small (1-33%) amount of necrotic tissue within the wound bed including Adherent Slough. The periwound skin appearance exhibited: Induration, Maceration. The periwound skin appearance did not exhibit: Callus, Crepitus, Excoriation, Rash, Scarring, Dry/Scaly, Atrophie Blanche, Cyanosis, Ecchymosis, Hemosiderin Staining, Mottled,  Pallor, Rubor, Erythema. Periwound temperature was noted as No Abnormality. The periwound has tenderness on palpation. Assessment Active Problems ICD-10 E11.621 - Type 2 diabetes mellitus with foot ulcer L89.314 - Pressure ulcer of right buttock, stage 4 L97.312 - Non-pressure chronic ulcer of right ankle with fat layer exposed F17.218 - Nicotine dependence, cigarettes, with other nicotine-induced disorders I70.233 - Atherosclerosis of native arteries of right leg with ulceration of ankle M86.371 - Chronic multifocal osteomyelitis, right ankle and foot Procedures Constantin, Charlane S. ([id number]) Wound #1 Wound #1 is a Diabetic Wound/Ulcer of the Lower Extremity located on the Right,Lateral Malleolus . There was a Skin/Subcutaneous Tissue Debridement ([id number]) debridement with total area of 3.15 sq cm performed by Christin Fudge, MD. with the following instrument(s): Curette to remove Non-Viable tissue/material including Fibrin/Slough and Subcutaneous after achieving pain control using Lidocaine 4% Topical Solution. A time out was conducted at 14:31, prior to the start of the procedure. A Minimum amount of bleeding was controlled with Pressure. The procedure was  tolerated well with a pain level of 0 throughout and a pain level of 0 following the procedure. Post Debridement Measurements: 1.5cm length x 2.1cm width x 0.3cm depth; 0.742cm^3 volume. Character of Wound/Ulcer Post Debridement requires further debridement. Severity of Tissue Post Debridement is: Fat layer exposed. Post procedure Diagnosis Wound #1: Same as Pre-Procedure Plan Wound Cleansing: Wound #1 Right,Lateral Malleolus: Clean wound with Normal Saline. Wound #2 Right Gluteus: Clean wound with Normal Saline. Anesthetic: Wound #1 Right,Lateral Malleolus: Topical Lidocaine 4% cream applied to wound bed prior to debridement Wound #2 Right Gluteus: Topical Lidocaine 4% cream applied to wound bed prior to debridement - in  clinic Skin Barriers/Peri-Wound Care: Wound #1 Right,Lateral Malleolus: Skin Prep - in clinic Wound #2 Right Gluteus: Skin Prep Primary Wound Dressing: Wound #1 Right,Lateral Malleolus: Santyl Ointment Wound #2 Right Gluteus: Hydrafera Blue - on granulated tissue then put the black foam over it Secondary Dressing: Wound #1 Right,Lateral Malleolus: Dry Gauze Boardered Foam Dressing - in clinic until [facility] applies VAC Wound #2 Right Gluteus: Boardered Foam Dressing - in [place] only - HHRN to replace NPWT tomorrow Dressing Change Frequency: Wound #1 Right,Lateral Malleolus: Change dressing every day. ASUZENA, WEIS ([id number]) Wound #2 Right Gluteus: Change Dressing Monday, Wednesday, Friday Follow-up Appointments: Wound #1 Right,Lateral Malleolus: Return Appointment in 1 week. Wound #2 Right Gluteus: Return Appointment in 1 week. Additional Orders / Instructions: Wound #1 Right,Lateral Malleolus: Stop Smoking Increase protein intake. Activity as tolerated Other: - Please add vitamin A, vitamin C and zinc supplements to your diet Wound #2 Right Gluteus: Stop Smoking Increase protein intake. Activity as tolerated Other: - Please add vitamin A, vitamin C and zinc supplements to your diet Home Health: Wound #1 Right,Lateral Malleolus: [place] Visits - [place] Nurse may visit PRN to address patient s wound care needs. FACE TO FACE ENCOUNTER: MEDICARE and MEDICAID PATIENTS: I certify that this patient is under my care and that I had a face-to-face encounter that meets the physician face-to-face encounter requirements with this patient on this date. The encounter with the patient was in whole or in part for the following MEDICAL CONDITION: (primary reason for [place]) MEDICAL NECESSITY: I certify, that based on my findings, NURSING services are a medically necessary home health service. HOME BOUND STATUS: I certify that my  clinical findings support that this patient is homebound (i.e., Due to illness or injury, pt requires aid of supportive devices such as crutches, cane, wheelchairs, walkers, the use of special transportation or the assistance of another person to leave their place of residence. There is a normal inability to leave the home and doing so requires considerable and taxing effort. Other absences are for medical reasons / religious services and are infrequent or of short duration when for other reasons). If current dressing causes regression in wound condition, may D/C ordered dressing product/s and apply Normal Saline Moist Dressing daily until next [place] / Other MD appointment. [place] of regression in wound condition at [phone]. Please direct any NON-WOUND related issues/requests for orders to patient's Primary Care Physician Wound #2 Right Gluteus: [place] Nurse may visit PRN to address patient s wound care needs. FACE TO FACE ENCOUNTER: MEDICARE and MEDICAID PATIENTS: I certify that this patient is under my care and that I had a face-to-face encounter that meets the physician face-to-face encounter requirements with this patient on this date. The encounter  with the patient was in whole or in part for the following MEDICAL CONDITION: (primary reason for [place]) MEDICAL NECESSITY: I certify, that based on my findings, NURSING services are a medically necessary home health service. HOME BOUND STATUS: I certify that my clinical findings support that this patient is homebound (i.e., Due to illness or injury, pt requires aid of supportive devices such as crutches, cane, wheelchairs, walkers, the use of special transportation or the assistance of another person to leave their place of residence. There is a normal inability to leave the home and doing so requires considerable and taxing effort. Other  absences are for medical reasons / religious services and are infrequent or of short duration when for other reasons). If current dressing causes regression in wound condition, may D/C ordered dressing product/s and apply Normal Saline Moist Dressing daily until next [place] / Other MD appointment. Notify Wound [place], [place] ([id number]) [place] of regression in wound condition at [phone]. Please direct any NON-WOUND related issues/requests for orders to patient's Primary Care Physician Negative Pressure Wound Therapy: Wound #2 Right Gluteus: Wound VAC settings at 125/130 mmHg continuous pressure. Use BLACK/GREEN foam to wound cavity. Use WHITE foam to fill any tunnel/s and/or undermining. Change VAC dressing 3 X WEEK. Change canister as indicated when full. Nurse may titrate settings and frequency of dressing changes as clinically indicated. - black foam to wound cavity only - use Hydrafera blue to granulated tissue Medications-please add to medication list.: Wound #1 Right,Lateral Malleolus: Santyl Enzymatic Ointment After review have recommended: 1. Santyl ointment with a packing to be applied to the right ankle, change daily 2. Hydrofera Blue to the superficial wound and then black foam of the wound VAC, to the right gluteal region to be changed 3 times a week. 3. Follow-up with Dr. Sharol Given regarding the right fibula bone biopsy. appointment pending 4. Follow up with infectious disease regarding long-term antibiotics. appointment pending 5. need to completely give up smoking -- I have commended on being off cigarettes for the last 3 weeks 6. adequate protein, vitamin A, vitamin C and zinc She has had all questions answered and will be compliant Electronic Signature(s) Signed: 02/20/2017 2:35:47 PM By: Christin Fudge MD, FACS Entered By: Christin Fudge on 02/20/2017 14:35:47 Korf, Halana S.  ([id number]) -------------------------------------------------------------------------------- SuperBill Details Patient Name: [name]. Date of Service: 02/20/2017 Medical Record Number: [id number] Patient Account Number: [id number] Date of Birth/Sex: [birth year] (51 y.o. 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, right ankle and foot Facility Procedures CPT4 Code Description: [id number] [id number] - DEB SUBQ TISSUE 20 SQ CM/< ICD-10 Description Diagnosis 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

## 2017-02-21 NOTE — Progress Notes (Signed)
KAMSIYOCHUKWU, BUIST ([id number]) Visit Report for 02/20/2017 Arrival Information Details Patient Name: [name], [name] [name]. Date of Service: 02/20/2017 2:00 PM Medical Record Number: [id number] Patient Account Number: [id number] Date of Birth/Sex: Jun 23, 1966 (51 y.o. Female) Treating RN: Afful, RN, [name], Velva Harman Primary Care Terence Googe: Delight Stare Other Clinician: Referring Garlon Tuggle: Delight Stare Treating Harshini Trent/Extender: Frann Rider in Treatment: 4 Visit Information History Since Last Visit All ordered tests and consults were completed: No Patient Arrived: [name] [name] [name] Added or deleted any medications: No Arrival Time: 14:05 Any new allergies or adverse reactions: No Accompanied By: self Had a fall or experienced change in No Transfer Assistance: None activities of daily living that may affect Patient Identification Verified: Yes risk of falls: Secondary Verification Process Completed: Yes Signs or symptoms of abuse/neglect since last No Patient Requires Transmission-Based No visito Precautions: Hospitalized since last visit: No Patient Has Alerts: No Has Dressing in Place as Prescribed: Yes Pain Present Now: Yes Electronic Signature(s) Signed: 02/20/2017 3:20:12 PM By: Regan Lemming [name], RN Entered By: Regan Lemming on 02/20/2017 14:07:27 [name], [name] ([id number]) -------------------------------------------------------------------------------- Encounter Discharge Information Details Patient Name: [name] [name] [name]. Date of Service: 02/20/2017 2:00 PM Medical Record Number: [id number] Patient Account Number: [id number] Date of Birth/Sex: August 25, 1966 (51 y.o. Female) Treating RN: Afful, RN, [name], Velva Harman Primary Care Huldah [name]: Delight Stare Other Clinician: Referring Jemuel Laursen: Delight Stare Treating Kalvin Buss/Extender: Frann Rider in Treatment: 4 Encounter Discharge Information Items Discharge Pain Level: 0 Discharge Condition: Stable Ambulatory Status: Walker Discharge Destination:  Home Transportation: Private Auto Accompanied By: self Schedule Follow-up Appointment: No Medication Reconciliation completed No and provided to Patient/Care Kamelia Lampkins: Provided on Clinical Summary of Care: 02/20/2017 Form Type Recipient Paper Patient RM Electronic Signature(s) Signed: 02/20/2017 2:53:50 PM By: Regan Lemming [name], RN Previous Signature: 02/20/2017 2:43:42 PM Version By: Ruthine Dose Entered By: Regan Lemming on 02/20/2017 14:53:50 [name] [name], [name] [name] [name] ([id number]) -------------------------------------------------------------------------------- Lower Extremity Assessment Details Patient Name: [name] [name] [name]. Date of Service: 02/20/2017 2:00 PM Medical Record Number: [id number] Patient Account Number: [id number] Date of Birth/Sex: 1966-03-04 (51 y.o. Female) Treating RN: Afful, RN, [name], Velva Harman Primary Care Moss Berry: Delight Stare Other Clinician: Referring Coryn Mosso: Delight Stare Treating Issaac Shipper/Extender: Frann Rider in Treatment: 4 Edema Assessment Assessed: [Left: No] [Right: No] Edema: [Left: N] [Right: o] Vascular Assessment Claudication: Claudication Assessment [Right:None] Pulses: Dorsalis Pedis Palpable: [Right:Yes] Posterior Tibial Extremity colors, hair growth, and conditions: Extremity Color: [Right:Normal] Hair Growth on Extremity: [Right:No] Temperature of Extremity: [Right:Warm] Capillary Refill: [Right:< 3 seconds] Electronic Signature(s) Signed: 02/20/2017 3:20:12 PM By: Regan Lemming [name], RN Entered By: Regan Lemming on 02/20/2017 14:13:17 [name], [name] S. ([id number]) -------------------------------------------------------------------------------- Multi Wound Chart Details Patient Name: [name] [name] [name]. Date of Service: 02/20/2017 2:00 PM Medical Record Number: [id number] Patient Account Number: [id number] Date of Birth/Sex: 07-30-1966 (51 y.o. Female) Treating RN: Baruch Gouty, RN, [name], Velva Harman Primary Care Kenshawn Maciolek: Delight Stare Other Clinician: Referring  Wynelle Dreier: Delight Stare Treating Trentyn Boisclair/Extender: Frann Rider in Treatment: 4 Vital Signs Height(in): 67 Pulse(bpm): 99 Weight(lbs): 137 Blood Pressure 78/52 (mmHg): Body Mass Index(BMI): 21 Temperature(F): 97.5 Respiratory Rate 17 (breaths/min): Photos: [1:No Photos] [2:No Photos] [N/A:N/A] Wound Location: [1:Right, Lateral Malleolus] [2:Right Gluteus] [N/A:N/A] Wounding Event: [1:Gradually Appeared] [2:Gradually Appeared] [N/A:N/A] Primary Etiology: [1:Diabetic Wound/Ulcer of the Lower Extremity] [2:Open Surgical Wound] [N/A:N/A] Comorbid History: [1:N/A] [2:Anemia, Arrhythmia, Coronary Artery Disease, Hypotension, Peripheral Arterial Disease, Type II Diabetes, Osteoarthritis] [N/A:N/A] Date Acquired: [1:09/23/2016] [2:09/18/2016] [N/A:N/A] Weeks of Treatment: [1:4] [2:4] [N/A:N/A] Wound Status: [1:Open] [2:Open] [N/A:N/A] Measurements L x W x D 1.5x2.1x0.3 [2:6.2x3x3.2] [  N/A:N/A] (cm) Area (cm) : [1:2.474] [2:14.608] [N/A:N/A] Volume (cm) : [1:0.742] [2:46.747] [N/A:N/A] % Reduction in Area: [1:37.00%] [2:24.00%] [N/A:N/A] % Reduction in Volume: 62.20% [2:51.40%] [N/A:N/A] Classification: [1:Grade 2] [2:Full Thickness Without Exposed Support Structures] [N/A:N/A] Exudate Amount: [1:N/A] [2:Large] [N/A:N/A] Exudate Type: [1:N/A] [2:Serosanguineous] [N/A:N/A] Exudate Color: [1:N/A] [2:red, brown] [N/A:N/A] Wound Margin: [1:N/A] [2:Distinct, outline attached] [N/A:N/A] Granulation Amount: [1:N/A] [2:Large (67-100%)] [N/A:N/A] Granulation Quality: [1:N/A] [2:Pink, Pale] [N/A:N/A] Necrotic Amount: [1:N/A] [2:Small (1-33%)] [N/A:N/A] Epithelialization: [1:N/A] [2:None] [N/A:N/A] Debridement: Debridement (16109- N/A N/A 11047) Pre-procedure 14:31 N/A N/A Verification/Time Out Taken: Pain Control: Lidocaine 4% Topical N/A N/A Solution Tissue Debrided: Fibrin/Slough, N/A N/A Subcutaneous Level: Skin/Subcutaneous N/A N/A Tissue Debridement Area (sq 3.15 N/A  N/A cm): Instrument: Curette N/A N/A Bleeding: Minimum N/A N/A Hemostasis Achieved: Pressure N/A N/A Procedural Pain: 0 N/A N/A Post Procedural Pain: 0 N/A N/A Debridement Treatment Procedure was tolerated N/A N/A Response: well Post Debridement 1.5x2.1x0.3 N/A N/A Measurements L x W x D (cm) Post Debridement 0.742 N/A N/A Volume: (cm) Periwound Skin Texture: No Abnormalities Noted Induration: Yes N/A Excoriation: No Callus: No Crepitus: No Rash: No Scarring: No Periwound Skin No Abnormalities Noted Maceration: Yes N/A Moisture: Dry/Scaly: No Periwound Skin Color: No Abnormalities Noted Atrophie Blanche: No N/A Cyanosis: No Ecchymosis: No Erythema: No Hemosiderin Staining: No Mottled: No Pallor: No Rubor: No Temperature: N/A No Abnormality N/A Tenderness on No Yes N/A Palpation: Wound Preparation: N/A Ulcer Cleansing: N/A Rinsed/Irrigated with Saline Topical Anesthetic [name] [name] [name], [name] S. ([id number]) Applied: Other: lidocaine 4% Procedures Performed: Debridement N/A N/A Treatment Notes Electronic Signature(s) Signed: 02/20/2017 2:33:01 PM By: Christin Fudge MD, FACS Entered By: Christin Fudge on 02/20/2017 14:33:01 [place], [name] [name] [name] ([id number]) -------------------------------------------------------------------------------- [place] Details Patient Name: MAYLEIGH, TETRAULT. Date of Service: 02/20/2017 2:00 PM Medical Record Number: [id number] Patient Account Number: [id number] Date of Birth/Sex: 04/01/1966 (51 y.o. Female) Treating RN: Afful, RN, [name], [employer] Primary Care Brett Darko: Delight Stare Other Clinician: Referring Kashmere Staffa: Delight Stare Treating Caliyah Sieh/Extender: Frann Rider in Treatment: 4 Active Inactive ` Orientation to the Wound Care Program Nursing Diagnoses: Knowledge deficit related to the wound healing center program Goals: Patient/caregiver will verbalize understanding of the [facility] Program Date Initiated:  01/23/2017 Target Resolution Date: 05/25/2017 Goal Status: Active Interventions: Provide education on orientation to the wound center Notes: ` Peripheral Neuropathy Nursing Diagnoses: Knowledge deficit related to disease process and management of peripheral neurovascular dysfunction Potential alteration in peripheral tissue perfusion (select prior to confirmation of diagnosis) Goals: Patient/caregiver will verbalize understanding of disease process and disease management Date Initiated: 01/23/2017 Target Resolution Date: 05/25/2017 Goal Status: Active Interventions: Assess signs and symptoms of neuropathy upon admission and as needed Provide education on Management of Neuropathy and Related Ulcers Provide education on Management of Neuropathy upon discharge from the [facility] Activities: Test ordered outside of clinic : 01/23/2017 Notes: JAIMY, KLIETHERMES ([id number]) ` Pressure Nursing Diagnoses: Knowledge deficit related to causes and risk factors for pressure ulcer development Knowledge deficit related to management of pressures ulcers Potential for impaired tissue integrity related to pressure, friction, moisture, and shear Goals: Patient will remain free from development of additional pressure ulcers Date Initiated: 01/23/2017 Target Resolution Date: 05/25/2017 Goal Status: Active Patient will remain free of pressure ulcers Date Initiated: 01/23/2017 Target Resolution Date: 05/25/2017 Goal Status: Active Patient/caregiver will verbalize risk factors for pressure ulcer development Date Initiated: 01/23/2017 Target Resolution Date: 05/25/2017 Goal Status: Active Patient/caregiver will verbalize understanding of pressure ulcer management Date Initiated: 01/23/2017 Target Resolution  Date: 05/25/2017 Goal Status: Active Interventions: Assess: immobility, friction, shearing, incontinence upon admission and as needed Assess offloading mechanisms upon admission and as needed Assess  potential for pressure ulcer upon admission and as needed Provide education on pressure ulcers Treatment Activities: Patient referred for pressure reduction/relief devices : 01/23/2017 Patient referred for seating evaluation to ensure proper offloading : 01/23/2017 Pressure reduction/relief device ordered : 01/23/2017 Notes: ` Wound/Skin Impairment Nursing Diagnoses: Impaired tissue integrity Knowledge deficit related to smoking impact on wound healing Knowledge deficit related to ulceration/compromised skin integrity Goals: Patient/caregiver will verbalize understanding of skin care regimen [name] [name] [name], [name] [name] [name] ([id number]) Date Initiated: 01/23/2017 Target Resolution Date: 05/25/2017 Goal Status: Active Ulcer/skin breakdown will have a volume reduction of 30% by week 4 Date Initiated: 01/23/2017 Target Resolution Date: 05/25/2017 Goal Status: Active Ulcer/skin breakdown will have a volume reduction of 50% by week 8 Date Initiated: 01/23/2017 Target Resolution Date: 05/25/2017 Goal Status: Active Ulcer/skin breakdown will have a volume reduction of 80% by week 12 Date Initiated: 01/23/2017 Target Resolution Date: 05/25/2017 Goal Status: Active Ulcer/skin breakdown will heal within 14 weeks Date Initiated: 01/23/2017 Target Resolution Date: 05/25/2017 Goal Status: Active Interventions: Assess patient/caregiver ability to obtain necessary supplies Assess patient/caregiver ability to perform ulcer/skin care regimen upon admission and as needed Assess ulceration(s) every visit Provide education on smoking Provide education on ulcer and skin care Treatment Activities: Patient referred to home care : 01/23/2017 Referred to DME [name] [name] [name] for dressing supplies : 01/23/2017 Skin care regimen initiated : 01/23/2017 Topical wound management initiated : 01/23/2017 Notes: Electronic Signature(s) Signed: 02/20/2017 3:20:12 PM By: Regan Lemming [name], RN Entered By: Regan Lemming on 02/20/2017 14:22:27 Hallisey, [name] [name] [name]  ([id number]) -------------------------------------------------------------------------------- Pain Assessment Details Patient Name: [name] [name] [name]. Date of Service: 02/20/2017 2:00 PM Medical Record Number: [id number] Patient Account Number: [id number] Date of Birth/Sex: 20-Dec-1965 (51 y.o. Female) Treating RN: Afful, RN, [name], Velva Harman Primary Care Fredia Chittenden: Delight Stare Other Clinician: Referring Akshath Mccarey: Delight Stare Treating Jos Cygan/Extender: Frann Rider in Treatment: 4 Active Problems Location of Pain Severity and Description of Pain Patient Has Paino No Site Locations With Dressing Change: No Pain Management and Medication Current Pain Management: Electronic Signature(s) Signed: 02/20/2017 3:20:12 PM By: Regan Lemming [name], RN Entered By: Regan Lemming on 02/20/2017 14:07:37 Buckel, [name] [name] [name] ([id number]) -------------------------------------------------------------------------------- Patient/Caregiver Education Details Patient Name: [name] [name] [name]. Date of Service: 02/20/2017 2:00 PM Medical Record Number: [id number] Patient Account Number: [id number] Date of Birth/Gender: 1966-08-30 (51 y.o. Female) Treating RN: Afful, RN, [name], Velva Harman Primary Care Physician: Delight Stare Other Clinician: Referring Physician: Delight Stare Treating Physician/Extender: Frann Rider in Treatment: 4 Education Assessment Education Provided To: Patient Education Topics Provided Peripheral Neuropathy: Methods: Explain/Verbal Responses: State content correctly Pressure: Methods: Explain/Verbal Responses: State content correctly Smoking and Wound Healing: Methods: Explain/Verbal Responses: Return demonstration correctly Welcome To [facility]: Methods: Explain/Verbal Responses: State content correctly Wound Debridement: Methods: Explain/Verbal Responses: State content correctly Wound/Skin Impairment: Methods: Explain/Verbal Responses: State content correctly Electronic  Signature(s) Signed: 02/20/2017 3:20:12 PM By: Regan Lemming [name], RN Entered By: Regan Lemming on 02/20/2017 14:54:27 [name] [name] [name], [name] S. ([id number]) -------------------------------------------------------------------------------- Wound Assessment Details Patient Name: [name] [name] [name]. Date of Service: 02/20/2017 2:00 PM Medical Record Number: [id number] Patient Account Number: [id number] Date of Birth/Sex: 02/04/1966 (51 y.o. Female) Treating RN: Baruch Gouty, RN, [name], Velva Harman Primary Care Stone Spirito: Delight Stare Other Clinician: Referring Godfrey Tritschler: Delight Stare Treating Nefertiti Mohamad/Extender: Frann Rider in Treatment: 4 Wound Status Wound Number: 1 Primary Diabetic Wound/Ulcer of the Lower Etiology: Extremity  Wound Location: Right, Lateral Malleolus Wound Status: Open Wounding Event: Gradually Appeared Date Acquired: 09/23/2016 Weeks Of Treatment: 4 Clustered Wound: No Photos Photo Uploaded By: Regan Lemming on 02/20/2017 15:09:17 Wound Measurements Length: (cm) 1.5 Width: (cm) 2.1 Depth: (cm) 0.3 Area: (cm) 2.474 Volume: (cm) 0.742 % Reduction in Area: 37% % Reduction in Volume: 62.2% Wound Description Classification: Grade 2 Periwound Skin Texture Texture Color No Abnormalities Noted: No No Abnormalities Noted: No [name] [name] [name], [name] S. ([id number]) Moisture No Abnormalities Noted: No Treatment Notes Wound #1 (Right, Lateral Malleolus) 1. Cleansed with: Clean wound with Normal Saline 4. Dressing Applied: Santyl Ointment 5. Secondary Dressing Applied Bordered Foam Dressing Dry Gauze Saline moistened guaze Electronic Signature(s) Signed: 02/20/2017 3:20:12 PM By: Regan Lemming [name], RN Entered By: Regan Lemming on 02/20/2017 14:16:44 [place], [place]. ([id number]) -------------------------------------------------------------------------------- Wound Assessment Details Patient Name: [name] [name] [name]. Date of Service: 02/20/2017 2:00 PM Medical Record Number: [id number] Patient Account  Number: [id number] Date of Birth/Sex: 08-11-1966 (51 y.o. Female) Treating RN: Afful, RN, [name], [employer] Primary Care Emberlie Gotcher: Delight Stare Other Clinician: Referring Yurani Fettes: Delight Stare Treating Jonan Seufert/Extender: Frann Rider in Treatment: 4 Wound Status Wound Number: 2 Primary Open Surgical Wound Etiology: Wound Location: Right Gluteus Wound Open Wounding Event: Gradually Appeared Status: Date Acquired: 09/18/2016 Comorbid Anemia, Arrhythmia, Coronary Artery Weeks Of Treatment: 4 History: Disease, Hypotension, Peripheral Clustered Wound: No Arterial Disease, Type II Diabetes, Osteoarthritis Photos Photo Uploaded By: Regan Lemming on 02/20/2017 15:09:18 Wound Measurements Length: (cm) 6.2 Width: (cm) 3 Depth: (cm) 3.2 Area: (cm) 14.608 Volume: (cm) 46.747 % Reduction in Area: 24% % Reduction in Volume: 51.4% Epithelialization: None Tunneling: No Undermining: No Wound Description Full Thickness Without Exposed Classification: Support Structures Wound Margin: Distinct, outline attached Exudate Large Amount: Exudate Type: Serosanguineous Exudate Color: red, brown Foul Odor After Cleansing: No Slough/Fibrino Yes Wound Bed Granulation Amount: Large (67-100%) Exposed Structure [name] [name] [name], [name] S. ([id number]) Granulation Quality: Pink, Pale Fascia Exposed: No Necrotic Amount: Small (1-33%) Fat Layer (Subcutaneous Tissue) Exposed: Yes Necrotic Quality: Adherent Slough Tendon Exposed: No Muscle Exposed: No Joint Exposed: No Bone Exposed: No Periwound Skin Texture Texture Color No Abnormalities Noted: No No Abnormalities Noted: No Callus: No Atrophie Blanche: No Crepitus: No Cyanosis: No Excoriation: No Ecchymosis: No Induration: Yes Erythema: No Rash: No Hemosiderin Staining: No Scarring: No Mottled: No Pallor: No Moisture Rubor: No No Abnormalities Noted: No Dry / Scaly: No Temperature / Pain Maceration: Yes Temperature: No  Abnormality Tenderness on Palpation: Yes Wound Preparation Ulcer Cleansing: Rinsed/Irrigated with Saline Topical Anesthetic Applied: Other: lidocaine 4%, Treatment Notes Wound #2 (Right Gluteus) 1. Cleansed with: Clean wound with Normal Saline 4. Dressing Applied: Santyl Ointment 5. Secondary Dressing Applied Bordered Foam Dressing Dry Gauze Saline moistened guaze Electronic Signature(s) Signed: 02/20/2017 3:20:12 PM By: Regan Lemming [name], RN Entered By: Regan Lemming on 02/20/2017 14:22:16 [name] [name] [name], [name] [name] [name] ([id number]) -------------------------------------------------------------------------------- [place] Details Patient Name: [name] [name] [name]. Date of Service: 02/20/2017 2:00 PM Medical Record Number: [id number] Patient Account Number: [id number] Date of Birth/Sex: 1966/09/29 (51 y.o. Female) Treating RN: Afful, RN, [name], [employer] Primary Care Fahim Kats: Delight Stare Other Clinician: Referring Michal Strzelecki: Delight Stare Treating Carys Malina/Extender: Frann Rider in Treatment: 4 Vital Signs Time Taken: 14:05 Temperature (F): 97.5 Height (in): 67 Pulse (bpm): 99 Weight (lbs): 137 Respiratory Rate (breaths/min): 17 Body Mass Index (BMI): 21.5 Blood Pressure (mmHg): 78/52 Reference Range: 80 - 120 mg / dl Notes Patient asymptomatic. denies dizziness and she said she hasn't taken her mirilone Electronic Signature(s) Signed: 02/20/2017 3:20:12  PM By: Regan Lemming [name], RN Entered By: Regan Lemming on 02/20/2017 14:12:37

## 2017-02-27 ENCOUNTER — Encounter: Payer: Self-pay | Admitting: Surgery

## 2017-02-28 ENCOUNTER — Encounter: Payer: Medicare Other | Admitting: Surgery

## 2017-02-28 DIAGNOSIS — E11621 Type 2 diabetes mellitus with foot ulcer: Secondary | ICD-10-CM | POA: Diagnosis not present

## 2017-03-02 NOTE — Progress Notes (Signed)
DELENE, MORAIS ([id number]) Visit Report for 02/28/2017 Chief Complaint Document Details Patient Name: [name], [name]. Date of Service: 02/28/2017 2:00 PM Medical Record Number: [id number] Patient Account Number: [id number] Date of Birth/Sex: 1966/11/17 (51 y.o. Female) Treating RN: Afful, RN, [name], Velva Harman Primary Care Provider: Delight Stare Other Clinician: Referring Provider: Delight Stare Treating Provider/Extender: Frann Rider in Treatment: 5 Information Obtained from: Patient Chief Complaint Patients presents for treatment of an open diabetic ulcer with an arterial etiology on her right lateral ankle and right gluteal area for about 4 months Electronic Signature([name]) Signed: 02/28/2017 2:36:08 PM By: Christin Fudge MD, FACS Entered By: Christin Fudge on 02/28/2017 14:36:08 Few, [name] ([id number]) -------------------------------------------------------------------------------- HPI Details Patient Name: [name]. Date of Service: 02/28/2017 2:00 PM Medical Record Number: [id number] Patient Account Number: [id number] Date of Birth/Sex: 1966/03/19 (51 y.o. Female) Treating RN: Afful, RN, [name], Velva Harman Primary Care Provider: Delight Stare Other Clinician: Referring Provider: Delight Stare Treating Provider/Extender: Frann Rider in Treatment: 5 History of Present Illness Location: right lateral ankle and right gluteal area Quality: Patient reports experiencing a sharp pain to affected area([name]). Severity: Patient states wound are getting worse. Duration: Patient has had the wound for > 4 months prior to seeking treatment at the wound center Timing: Pain in wound is constant (hurts all the time) Context: The wound occurred when the patient was in hospital with a necrotizing fasciitis of the right gluteal area and a ulceration on the right ankle Modifying Factors: Other treatment([name]) tried include:admitted to the hospital for IV antibiotics and a full workup and has also  had a recent angiogram Associated Signs and Symptoms: Patient reports having increase discharge. HPI Description: 51 year old patient was sent to [place] from [facility] where she was seen by Dr. Sherril Cong for a left ankle ulceration and was recently hospitalized with hypotension and sepsis. She was treated with IV antibiotics and has been scheduled to see Dr. Sharol Given. She was seen by vascular surgery who recommended a femoral bypass but surgery has been delayed until her sacral wound from last year'[name] necrotizing fasciitis has healed. Was seeing the wound care team at [facility] but wanted to change over. She is a smoker and smokes a pack of cigarettes a day The patient was recently admitted in [place] between February 2 and February 14. She had a follow- up to see vascular surgery, orthopedic surgery and infectious disease. 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 Review of the electronic medical records reveals that Dr. 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 An MRI showed edema in the bone which was consistent with osteomyelitis and the chronic ulcer and [name] need surgical intervention. She also had a sacral decubitus ulcer which was treated with the wound VAC. On 01/07/2017 she was taken up by the vascular surgeon Dr. Trula Slade for an abdominal aortogram and bilateral lower extremity runoff, for a history of having bilateral femoropopliteal bypass graft as well as external iliac stenting on the right and stenting of her bypass graft. She had developed a  nonhealing wound on her right ankle and there was a possibility of a femoral  occlusion. the findings were noted and the impression was a surgical revascularization with a aorto bifemoral bypass graft. Both the femoropopliteal bypass grafts were patent. 01/31/2017 -- x-ray of the right hip and pelvis -- IMPRESSION:No radiographic evidence of acute osteomyelitis. Normal-appearing right hip joint space for age. No acute bony abnormality of the hip. 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 Since seeing her last week she has not had an appointment with infectious disease or the orthopedic specialist yet. [name], [name] ([id number]) 02/06/2017 -- the patient missed a couple of appointments yesterday due to the weather but other than that has apparently given up smoking for the last 5 days. 02/13/2017 -- she has rescheduled her infectious disease appointment and also the appointment with the orthopedic surgeon Dr. Sharol Given Electronic Signature([name]) Signed: 02/28/2017 2:36:19 PM By: Christin Fudge MD, FACS Entered By: Christin Fudge on 02/28/2017 14:36:18 [name], [name] ([id number]) -------------------------------------------------------------------------------- Physical Exam Details Patient Name: [name], [name]. Date of Service: 02/28/2017 2:00 PM Medical Record Number: [id number] Patient Account Number: [id number] Date of Birth/Sex: 06-02-1966 (51 y.o. Female) Treating RN: Baruch Gouty, RN, [name], [employer] Primary Care Provider: Delight Stare Other Clinician: Referring Provider: Delight Stare Treating Provider/Extender: Frann Rider in Treatment: 5 Constitutional . Pulse regular. Respirations normal and unlabored. Afebrile. . Eyes Nonicteric. Reactive to light. Ears, Nose, Mouth, and Throat Lips, teeth, and gums WNL.Marland Kitchen Moist mucosa without lesions. Neck supple and nontender. No palpable supraclavicular or cervical adenopathy. Normal sized  without goiter. Respiratory WNL. No retractions.. Breath sounds WNL, No rubs, rales, rhonchi, or wheeze.. Cardiovascular Heart rhythm and rate regular, no murmur or gallop.. Pedal Pulses WNL. No clubbing, cyanosis or edema. Chest Breasts symmetical and no nipple discharge.. Breast tissue WNL, no masses, lumps, or tenderness.. Lymphatic No adneopathy. No adenopathy. No adenopathy. Musculoskeletal Adexa without tenderness or enlargement.. Digits and nails w/o clubbing, cyanosis, infection, petechiae, ischemia, or inflammatory conditions.. Integumentary (Hair, Skin) No suspicious lesions. No crepitus or fluctuance. No peri-wound warmth or erythema. No masses.Marland Kitchen Psychiatric Judgement and insight Intact.. No evidence of depression, anxiety, or agitation.. Notes the right gluteal wound is looking very good with the upper half of the wound having granulated right up to the skin. The lower half has depth going down towards the bone but the bone is covered with healthy granulation tissue and overall there has been excellent improvement. The right ankle wound did not need sharp debridement today but there is undermining circumferentially and I have asked them to use a packing strip under the circumferential flaps. We'll continue to use Santyl in this position Electronic Signature([name]) Signed: 02/28/2017 2:37:24 PM By: Christin Fudge MD, FACS Entered By: Christin Fudge on 02/28/2017 14:37:24 [name], [name] ([id number]) Mehan, [name] ([id number]) -------------------------------------------------------------------------------- Physician Orders Details Patient Name: [name]. Date of Service: 02/28/2017 2:00 PM Medical Record Number: [id number] Patient Account Number: [id number] Date of Birth/Sex: 11-11-1966 (51 y.o. Female) Treating RN: Afful, RN, [name], Velva Harman Primary Care Provider: Delight Stare Other Clinician: Referring Provider: Delight Stare Treating Provider/Extender: Frann Rider  in Treatment: 5 Verbal / Phone Orders: No Diagnosis Coding Wound Cleansing Wound #1 Right,Lateral Malleolus o Clean wound with Normal Saline. Wound #2 Right Gluteus o Clean wound with Normal Saline. Anesthetic Wound #1 Right,Lateral Malleolus o Topical Lidocaine 4% cream applied to wound bed prior to debridement Wound #2 Right Gluteus o Topical Lidocaine 4% cream applied to wound bed prior to debridement - in clinic Skin Barriers/Peri-Wound  Care Wound #1 Right,Lateral Malleolus o Skin Prep - in clinic Wound #2 Right Gluteus o Skin Prep Primary Wound Dressing Wound #1 Right,Lateral Malleolus o Santyl Ointment o Plain packing gauze - pack slightly to undermining Wound #2 Right Gluteus o Hydrafera Blue - on granulated tissue then put the black foam over it Secondary Dressing Wound #1 Right,Lateral Malleolus o Dry Gauze o Boardered Foam Dressing - in clinic until [facility] applies VAC Wound #2 Right Gluteus o Boardered Foam Dressing - in [facility] only - HHRN to replace NPWT tomorrow CHRISTAN, CICCARELLI ([id number]) Dressing Change Frequency Wound #1 Right,Lateral Malleolus o Change dressing every day. Wound #2 Right Gluteus o Change Dressing Monday, Wednesday, Friday Follow-up Appointments Wound #1 Right,Lateral Malleolus o Return Appointment in 1 week. Wound #2 Right Gluteus o Return Appointment in 1 week. Additional Orders / Instructions Wound #1 Right,Lateral Malleolus o Stop Smoking o Increase protein intake. o Activity as tolerated o Other: - Please add vitamin A, vitamin C and zinc supplements to your diet Wound #2 Right Gluteus o Stop Smoking o Increase protein intake. o Activity as tolerated o Other: - Please add vitamin A, vitamin C and zinc supplements to your diet Home Health Wound #1 [place] Visits - [place] Nurse [name] visit PRN to address  patientos wound care needs. o FACE TO FACE ENCOUNTER: MEDICARE and MEDICAID PATIENTS: I certify that this patient is under my care and that I had a face-to-face encounter that meets the physician face-to-face encounter requirements with this patient on this date. The encounter with the patient was in whole or in part for the following MEDICAL CONDITION: (primary reason for [place]) MEDICAL NECESSITY: I certify, that based on my findings, NURSING services are a medically necessary home health service. HOME BOUND STATUS: I certify that my clinical findings support that this patient is homebound (i.e., Due to illness or injury, pt requires aid of supportive devices such as crutches, cane, wheelchairs, walkers, the use of special transportation or the assistance of another person to leave their place of residence. There is a normal inability to leave the home and doing so requires considerable and taxing effort. Other absences are for medical reasons / religious services and are infrequent or of short duration when for other reasons). o If current dressing causes regression in wound condition, [name] D/C ordered dressing product/[name] and apply Normal Saline Moist Dressing daily until next [place] / Other MD appointment. [place] of regression in wound condition at [phone]. ENVI, EAGLESON ([id number]) o Please direct any NON-WOUND related issues/requests for orders to patient'[name] Primary Care Physician Wound #2 Right [place] Visits - [place] Nurse [name] visit PRN to address patientos wound care needs. o FACE TO FACE ENCOUNTER: MEDICARE and MEDICAID PATIENTS: I certify that this patient is under my care and that I had a face-to-face encounter that meets the physician face-to-face encounter requirements with this patient on this date. The encounter with the patient was in whole or in part for the  following MEDICAL CONDITION: (primary reason for [place]) MEDICAL NECESSITY: I certify, that based on my findings, NURSING services are a medically necessary home health service. HOME BOUND STATUS: I certify that my clinical findings support that this patient is homebound (i.e., Due to illness or injury, pt requires aid of supportive devices such as crutches, cane, wheelchairs, walkers, the use of special transportation  or the assistance of another person to leave their place of residence. There is a normal inability to leave the home and doing so requires considerable and taxing effort. Other absences are for medical reasons / religious services and are infrequent or of short duration when for other reasons). o If current dressing causes regression in wound condition, [name] D/C ordered dressing product/[name] and apply Normal Saline Moist Dressing daily until next [place] / Other MD appointment. [place] of regression in wound condition at [phone]. o Please direct any NON-WOUND related issues/requests for orders to patient'[name] Primary Care Physician Negative Pressure Wound Therapy Wound #2 Right Gluteus o Wound VAC settings at 125/130 mmHg continuous pressure. Use BLACK/GREEN foam to wound cavity. Use WHITE foam to fill any tunnel/[name] and/or undermining. Change VAC dressing 3 X WEEK. Change canister as indicated when full. Nurse [name] titrate settings and frequency of dressing changes as clinically indicated. - black foam to wound cavity only - use Hydrafera blue to granulated tissue Medications-please add to medication list. Wound #1 Right,Lateral Malleolus o Santyl Enzymatic Ointment Electronic Signature([name]) Signed: 02/28/2017 2:58:29 PM By: Regan Lemming [name], RN Signed: 02/28/2017 4:08:29 PM By: Christin Fudge MD, FACS Entered By: Regan Lemming on 02/28/2017 14:40:12 [name], [name]  ([id number]) -------------------------------------------------------------------------------- Problem List Details Patient Name: [name], [name]. Date of Service: 02/28/2017 2:00 PM Medical Record Number: [id number] Patient Account Number: [id number] Date of Birth/Sex: 1966-04-04 (51 y.o. Female) Treating RN: Afful, RN, [name], Velva Harman Primary Care Provider: Delight Stare Other Clinician: Referring Provider: Delight Stare Treating Provider/Extender: Frann Rider in Treatment: 5 Active Problems ICD-10 Encounter Code Description Active Date Diagnosis E11.621 Type 2 diabetes mellitus with foot ulcer 01/23/2017 Yes L89.314 Pressure ulcer of right buttock, stage 4 01/23/2017 Yes L97.312 Non-pressure chronic ulcer of right ankle with fat layer 01/23/2017 Yes exposed F17.218 Nicotine dependence, cigarettes, with other nicotine- 01/23/2017 Yes induced disorders I70.233 Atherosclerosis of native arteries of right leg with 01/23/2017 Yes ulceration of ankle M86.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, right ankle and foot 01/23/2017 Yes Inactive Problems Resolved Problems Electronic Signature([name]) Signed: 02/28/2017 2:35:47 PM By: Christin Fudge MD, FACS Entered By: Christin Fudge on 02/28/2017 14:35:47 [name], [name] ([id number]) -------------------------------------------------------------------------------- Progress Note Details Patient Name: [name]. Date of Service: 02/28/2017 2:00 PM Medical Record Number: [id number] Patient Account Number: [id number] Date of Birth/Sex: 1966-02-07 (51 y.o. Female) Treating RN: Afful, RN, [name], [employer] Primary Care Provider: Delight Stare Other Clinician: Referring Provider: Delight Stare Treating Provider/Extender: Frann Rider in Treatment: 5 Subjective Chief Complaint Information obtained from Patient Patients presents for treatment of an open diabetic ulcer with an arterial etiology on her right lateral ankle and right gluteal area for about 4  months History of Present Illness (HPI) The following HPI elements were documented for the patient'[name] wound: Location: right lateral ankle and right gluteal area Quality: Patient reports experiencing a sharp pain to affected area([name]). Severity: Patient states wound are getting worse. Duration: Patient has had the wound for > 4 months prior to seeking treatment at the wound center Timing: Pain in wound is constant (hurts all the time) Context: The wound occurred when the patient was in hospital with a necrotizing fasciitis of the right gluteal area and a ulceration on the right ankle Modifying Factors: Other treatment([name]) tried include:admitted to the hospital for IV antibiotics and a full workup and has also had a recent angiogram Associated Signs and Symptoms: Patient reports having increase discharge. 51 year old patient was sent to [place] from [place]  [facility] where she was seen by Dr. Sherril Cong for a left ankle ulceration and was recently hospitalized with hypotension and sepsis. She was treated with IV antibiotics and has been scheduled to see Dr. Sharol Given. She was seen by vascular surgery who recommended a femoral bypass but surgery has been delayed until her sacral wound from last year'[name] necrotizing fasciitis has healed. Was seeing the wound care team at [facility] but wanted to change over. She is a smoker and smokes a pack of cigarettes a day The patient was recently admitted in [place] between February 2 and February 14. She had a follow- up to see vascular surgery, orthopedic surgery and infectious disease. 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 Review of the  electronic medical records reveals that Dr. 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 An MRI showed edema in the bone which was consistent with osteomyelitis and the chronic ulcer and [name] need surgical intervention. She also had a sacral decubitus ulcer which was treated with the wound VAC. On 01/07/2017 she was taken up by the vascular surgeon Dr. Trula Slade for an abdominal aortogram and bilateral lower extremity runoff, for a history of having bilateral femoropopliteal bypass graft as well as external iliac stenting on the right and stenting of her bypass graft. She had developed a nonhealing wound on her right ankle and there was a possibility of a femoral occlusion. the findings were noted and the ANYI, FELS. ([id number]) impression was a surgical revascularization with a aorto bifemoral bypass graft. Both the femoropopliteal bypass grafts were patent. 01/31/2017 -- x-ray of the right hip and pelvis -- IMPRESSION:No radiographic evidence of acute osteomyelitis. Normal-appearing right hip joint space for age. No acute bony abnormality of the hip. 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 Since seeing her last week she has not had an appointment with infectious disease or the orthopedic specialist yet. 02/06/2017 -- the patient missed a couple of appointments yesterday due to the weather but other than that has apparently given up smoking for the last 5 days. 02/13/2017 -- she has rescheduled her infectious disease appointment and also the appointment with the orthopedic surgeon Dr. Sharol Given Objective Constitutional Pulse regular. Respirations normal and unlabored. Afebrile. Vitals Time Taken: 2:14 PM, Height: 67 in, Weight: 137 lbs, BMI: 21.5, Temperature: 98.3 F, Pulse: 93 bpm, Respiratory Rate: 16 breaths/min, Blood  Pressure: 87/63 mmHg. Eyes Nonicteric. Reactive to light. Ears, Nose, Mouth, and Throat Lips, teeth, and gums WNL.Marland Kitchen Moist mucosa without lesions. Neck supple and nontender. No palpable supraclavicular or cervical adenopathy. Normal sized without goiter. Respiratory WNL. No retractions.. Breath sounds WNL, No rubs, rales, rhonchi, or wheeze.. Cardiovascular Heart rhythm and rate regular, no murmur or gallop.. Pedal Pulses WNL. No clubbing, cyanosis or edema. Chest Breasts symmetical and no nipple discharge.. Breast tissue WNL, no masses, lumps, or tenderness.. Lymphatic No adneopathy. No adenopathy. No adenopathy. Musculoskeletal Brian, Shaneque [name]. ([id number]) Adexa without tenderness or enlargement.. Digits and nails w/o clubbing, cyanosis, infection, petechiae, ischemia, or inflammatory conditions.Marland Kitchen Psychiatric Judgement and insight Intact.. No evidence of  depression, anxiety, or agitation.. General Notes: the right gluteal wound is looking very good with the upper half of the wound having granulated right up to the skin. The lower half has depth going down towards the bone but the bone is covered with healthy granulation tissue and overall there has been excellent improvement. The right ankle wound did not need sharp debridement today but there is undermining circumferentially and I have asked them to use a packing strip under the circumferential flaps. We'll continue to use Santyl in this position Integumentary (Hair, Skin) No suspicious lesions. No crepitus or fluctuance. No peri-wound warmth or erythema. No masses.. Wound #1 status is Open. Original cause of wound was Gradually Appeared. The wound is located on the Right,Lateral Malleolus. The wound measures 1.7cm length x 1.9cm width x 0.3cm depth; 2.537cm^2 area and 0.761cm^3 volume. There is Fat Layer (Subcutaneous Tissue) Exposed exposed. There is no tunneling noted, however, there is undermining starting at 12:00 and ending at  12:00 with a maximum distance of 1cm. There is a medium amount of serosanguineous drainage noted. The wound margin is distinct with the outline attached to the wound base. There is medium (34-66%) pink granulation within the wound bed. There is a medium (34-66%) amount of necrotic tissue within the wound bed including Adherent Slough. The periwound skin appearance exhibited: Induration, Maceration. The periwound skin appearance did not exhibit: Callus, Crepitus, Excoriation, Rash, Scarring, Dry/Scaly, Atrophie Blanche, Cyanosis, Ecchymosis, Hemosiderin Staining, Mottled, Pallor, Rubor, Erythema. Periwound temperature was noted as No Abnormality. The periwound has tenderness on palpation. Wound #2 status is Open. Original cause of wound was Gradually Appeared. The wound is located on the Right Gluteus. The wound measures 5.3cm length x 2.3cm width x 3cm depth; 9.574cm^2 area and 28.722cm^3 volume. There is Fat Layer (Subcutaneous Tissue) Exposed exposed. There is no tunneling or undermining noted. There is a large amount of serosanguineous drainage noted. The wound margin is distinct with the outline attached to the wound base. There is large (67-100%) pink, pale granulation within the wound bed. There is a small (1-33%) amount of necrotic tissue within the wound bed including Adherent Slough. The periwound skin appearance exhibited: Induration, Maceration. The periwound skin appearance did not exhibit: Callus, Crepitus, Excoriation, Rash, Scarring, Dry/Scaly, Atrophie Blanche, Cyanosis, Ecchymosis, Hemosiderin Staining, Mottled, Pallor, Rubor, Erythema. Periwound temperature was noted as No Abnormality. The periwound has tenderness on palpation. Assessment Active Problems ICD-10 E11.621 - Type 2 diabetes mellitus with foot ulcer L89.314 - Pressure ulcer of right buttock, stage 4 L97.312 - Non-pressure chronic ulcer of right ankle with fat layer exposed [name], [name]. ([id number]) F17.218 -  Nicotine dependence, cigarettes, with other nicotine-induced disorders I70.233 - Atherosclerosis of native arteries of right leg with ulceration of ankle M86.371 - Chronic multifocal osteomyelitis, right ankle and foot Plan Wound Cleansing: Wound #1 Right,Lateral Malleolus: Clean wound with Normal Saline. Wound #2 Right Gluteus: Clean wound with Normal Saline. Anesthetic: Wound #1 Right,Lateral Malleolus: Topical Lidocaine 4% cream applied to wound bed prior to debridement Wound #2 Right Gluteus: Topical Lidocaine 4% cream applied to wound bed prior to debridement - in clinic Skin Barriers/Peri-Wound Care: Wound #1 Right,Lateral Malleolus: Skin Prep - in clinic Wound #2 Right Gluteus: Skin Prep Primary Wound Dressing: Wound #1 Right,Lateral Malleolus: Santyl Ointment Plain packing gauze - pack slightly to undermining Wound #2 Right Gluteus: Hydrafera Blue - on granulated tissue then put the black foam over it Secondary Dressing: Wound #1 Right,Lateral Malleolus: Dry Gauze Boardered Foam Dressing - in clinic  until [facility] applies VAC Wound #2 Right Gluteus: Boardered Foam Dressing - in [facility] only - HHRN to replace NPWT tomorrow Dressing Change Frequency: Wound #1 Right,Lateral Malleolus: Change dressing every day. Wound #2 Right Gluteus: Change Dressing Monday, Wednesday, Friday Follow-up Appointments: Wound #1 Right,Lateral Malleolus: Return Appointment in 1 week. Wound #2 Right Gluteus: Return Appointment in 1 week. Additional Orders / Instructions: Wound #1 Right,Lateral Malleolus: [name], Maylani [name]. ([id number]) Stop Smoking Increase protein intake. Activity as tolerated Other: - Please add vitamin A, vitamin C and zinc supplements to your diet Wound #2 Right Gluteus: Stop Smoking Increase protein intake. Activity as tolerated Other: - Please add vitamin A, vitamin C and zinc supplements to your diet Home Health: Wound #1 Right,Lateral Malleolus: [place] Visits - [place] Nurse [name] visit PRN to address patient [name] wound care needs. FACE TO FACE ENCOUNTER: MEDICARE and MEDICAID PATIENTS: I certify that this patient is under my care and that I had a face-to-face encounter that meets the physician face-to-face encounter requirements with this patient on this date. The encounter with the patient was in whole or in part for the following MEDICAL CONDITION: (primary reason for [place]) MEDICAL NECESSITY: I certify, that based on my findings, NURSING services are a medically necessary home health service. HOME BOUND STATUS: I certify that my clinical findings support that this patient is homebound (i.e., Due to illness or injury, pt requires aid of supportive devices such as crutches, cane, wheelchairs, walkers, the use of special transportation or the assistance of another person to leave their place of residence. There is a normal inability to leave the home and doing so requires considerable and taxing effort. Other absences are for medical reasons / religious services and are infrequent or of short duration when for other reasons). If current dressing causes regression in wound condition, [name] D/C ordered dressing product/[name] and apply Normal Saline Moist Dressing daily until next [place] / Other MD appointment. [place] of regression in wound condition at [phone]. Please direct any NON-WOUND related issues/requests for orders to patient'[name] Primary Care Physician Wound #2 Right Gluteus: [place] Nurse [name] visit PRN to address patient [name] wound care needs. FACE TO FACE ENCOUNTER: MEDICARE and MEDICAID PATIENTS: I certify that this patient is under my care and that I had a face-to-face encounter that meets the physician face-to-face encounter requirements with this patient on this date. The encounter with the patient was in whole  or in part for the following MEDICAL CONDITION: (primary reason for [place]) MEDICAL NECESSITY: I certify, that based on my findings, NURSING services are a medically necessary home health service. HOME BOUND STATUS: I certify that my clinical findings support that this patient is homebound (i.e., Due to illness or injury, pt requires aid of supportive devices such as crutches, cane, wheelchairs, walkers, the use of special transportation or the assistance of another person to leave their place of residence. There is a normal inability to leave the home and doing so requires considerable and taxing effort. Other absences are for medical reasons / religious services and are infrequent or of short duration when for other reasons). If current dressing causes regression in wound condition, [name] D/C ordered dressing product/[name] and apply Normal Saline Moist Dressing daily until next [place] / Other MD appointment. [place] of regression in wound condition at [phone]. Please direct any NON-WOUND related  issues/requests for orders to patient'[name] Primary Care Physician Negative Pressure Wound Therapy: Wound #2 Right Gluteus: Wound VAC settings at 125/130 mmHg continuous pressure. Use BLACK/GREEN foam to wound cavity. Use WHITE foam to fill any tunnel/[name] and/or undermining. Change VAC dressing 3 X WEEK. Change canister as indicated when full. Nurse [name] titrate settings and frequency of dressing changes as clinically indicated. - black foam to wound cavity only - use Hydrafera blue to granulated tissue Medications-please add to medication list.: Hagood, Tirzah [name]. ([id number]) Wound #1 Right,Lateral Malleolus: Santyl Enzymatic Ointment After review I have recommended: 1. Santyl ointment with a packing strip to be applied to the right ankle, change daily 2. Hydrofera Blue to the superficial wound and then black foam of the wound VAC, to the right gluteal region to be  changed 3 times a week. 3. Follow-up with Dr. Sharol Given regarding the right fibula bone biopsy. appointment pending 4. Follow up with infectious disease regarding long-term antibiotics. appointment pending 5. need to completely give up smoking -- I have commended on being off cigarettes for the last 4 weeks 6. adequate protein, vitamin A, vitamin C and zinc She has had all questions answered and will be compliant Electronic Signature([name]) Signed: 02/28/2017 2:40:31 PM By: Christin Fudge MD, FACS Entered By: Christin Fudge on 02/28/2017 14:40:31 [place], [name] ([id number]) -------------------------------------------------------------------------------- SuperBill Details Patient Name: [name]. Date of Service: 02/28/2017 Medical Record Number: [id number] Patient Account Number: [id number] Date of Birth/Sex: 06-10-1966 (51 y.o. 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

## 2017-03-02 NOTE — Progress Notes (Signed)
[name] ([id number]) Visit Report for 02/28/2017 Arrival Information Details Patient Name: [name], [name]. Date of Service: 02/28/2017 2:00 PM Medical Record Number: [id number] Patient Account Number: [id number] Date of Birth/Sex: 1966-09-10 (51 y.o. Female) Treating RN: Afful, RN, [name], Velva Harman Primary Care Isaura Schiller: Delight Stare Other Clinician: Referring Norberto Wishon: Delight Stare Treating Jaymarion Trombly/Extender: Frann Rider in Treatment: 5 Visit Information History Since Last Visit All ordered tests and consults were completed: No Patient Arrived: Gilford Rile Added or deleted any medications: No Arrival Time: 14:12 Any new allergies or adverse reactions: No Accompanied By: self Had a fall or experienced change in No Transfer Assistance: None activities of daily living that may affect Patient Identification Verified: Yes risk of falls: Secondary Verification Process Completed: Yes Signs or symptoms of abuse/neglect since last No Patient Requires Transmission-Based No visito Precautions: Hospitalized since last visit: No Patient Has Alerts: No Has Dressing in Place as Prescribed: Yes Pain Present Now: Yes Electronic Signature(s) Signed: 02/28/2017 2:58:29 PM By: Regan Lemming [name], RN Entered By: Regan Lemming on 02/28/2017 14:12:51 Fitterer, [name] ([id number]) -------------------------------------------------------------------------------- Clinic Level of Care Assessment Details Patient Name: [name]. Date of Service: 02/28/2017 2:00 PM Medical Record Number: [id number] Patient Account Number: [id number] Date of Birth/Sex: September 28, 1966 (51 y.o. Female) Treating RN: Afful, RN, [name], [employer] Primary Care Loyda Costin: Delight Stare Other Clinician: Referring Charlayne Vultaggio: Delight Stare Treating Myangel Summons/Extender: Frann Rider in Treatment: 5 Clinic Level of Care Assessment Items TOOL 4 Quantity Score []  - Use when only an EandM is performed on FOLLOW-UP visit 0 ASSESSMENTS -  Nursing Assessment / Reassessment X - Reassessment of Co-morbidities (includes updates in patient status) 1 10 X - Reassessment of Adherence to Treatment Plan 1 5 ASSESSMENTS - Wound and Skin Assessment / Reassessment []  - Simple Wound Assessment / Reassessment - one wound 0 X - Complex Wound Assessment / Reassessment - multiple wounds 2 5 []  - Dermatologic / Skin Assessment (not related to wound area) 0 ASSESSMENTS - Focused Assessment []  - Circumferential Edema Measurements - multi extremities 0 []  - Nutritional Assessment / Counseling / Intervention 0 X - Lower Extremity Assessment (monofilament, tuning fork, pulses) 1 5 []  - Peripheral Arterial Disease Assessment (using hand held doppler) 0 ASSESSMENTS - Ostomy and/or Continence Assessment and Care []  - Incontinence Assessment and Management 0 []  - Ostomy Care Assessment and Management (repouching, etc.) 0 PROCESS - Coordination of Care X - Simple Patient / Family Education for ongoing care 1 15 []  - Complex (extensive) Patient / Family Education for ongoing care 0 X - Staff obtains Programmer, systems, Records, Test Results / Process Orders 1 10 X - Staff telephones HHA, Nursing Homes / Clarify orders / etc 1 10 []  - Routine Transfer to another Facility (non-emergent condition) 0 Mittal, Adrine S. ([id number]) []  - Routine Hospital Admission (non-emergent condition) 0 []  - New Admissions / Biomedical engineer / Ordering NPWT, Apligraf, etc. 0 []  - Emergency Hospital Admission (emergent condition) 0 []  - Simple Discharge Coordination 0 []  - Complex (extensive) Discharge Coordination 0 PROCESS - Special Needs []  - Pediatric / Minor Patient Management 0 []  - Isolation Patient Management 0 []  - Hearing / Language / Visual special needs 0 []  - Assessment of Community assistance (transportation, D/C planning, etc.) 0 []  - Additional assistance / Altered mentation 0 []  - Support Surface(s) Assessment (bed, cushion, seat, etc.) 0 INTERVENTIONS  - Wound Cleansing / Measurement []  - Simple Wound Cleansing - one wound 0 X - Complex Wound Cleansing - multiple wounds 2  5 X - Wound Imaging (photographs - any number of wounds) 1 5 []  - Wound Tracing (instead of photographs) 0 []  - Simple Wound Measurement - one wound 0 X - Complex Wound Measurement - multiple wounds 2 5 INTERVENTIONS - Wound Dressings []  - Small Wound Dressing one or multiple wounds 0 X - Medium Wound Dressing one or multiple wounds 2 15 []  - Large Wound Dressing one or multiple wounds 0 []  - Application of Medications - topical 0 []  - Application of Medications - injection 0 INTERVENTIONS - Miscellaneous []  - External ear exam 0 Yasui, Yeraldy S. ([id number]) []  - Specimen Collection (cultures, biopsies, blood, body fluids, etc.) 0 []  - Specimen(s) / Culture(s) sent or taken to Lab for analysis 0 []  - Patient Transfer (multiple staff / Harrel Lemon Lift / Similar devices) 0 []  - Simple Staple / Suture removal (25 or less) 0 []  - Complex Staple / Suture removal (26 or more) 0 []  - Hypo / Hyperglycemic Management (close monitor of Blood Glucose) 0 []  - Ankle / Brachial Index (ABI) - do not check if billed separately 0 X - Vital Signs 1 5 Has the patient been seen at the hospital within the last three years: Yes Total Score: 125 Level Of Care: New/Established - Level 4 Electronic Signature(s) Signed: 02/28/2017 2:58:29 PM By: Regan Lemming [name], RN Entered By: Regan Lemming on 02/28/2017 14:40:57 Tow, [name] ([id number]) -------------------------------------------------------------------------------- Encounter Discharge Information Details Patient Name: [name]. Date of Service: 02/28/2017 2:00 PM Medical Record Number: [id number] Patient Account Number: [id number] Date of Birth/Sex: [birth year] (51 y.o. Female) Treating RN: Afful, RN, [name], Velva Harman Primary Care Zanyia Silbaugh: Delight Stare Other Clinician: Referring Zury Fazzino: Delight Stare Treating Kayse Puccini/Extender: Frann Rider in Treatment: 5 Encounter Discharge Information Items Discharge Pain Level: 0 Discharge Condition: Stable Ambulatory Status: Cane Discharge Destination: Home Transportation: Private Auto Accompanied By: self Schedule Follow-up Appointment: No Medication Reconciliation completed No and provided to Patient/Care Jason Frisbee: Provided on Clinical Summary of Care: 02/28/2017 Form Type Recipient Paper Patient RM Electronic Signature(s) Signed: 02/28/2017 2:48:42 PM By: Ruthine Dose Entered By: Ruthine Dose on 02/28/2017 14:48:42 Cossey, Otto S. ([id number]) -------------------------------------------------------------------------------- Lower Extremity Assessment Details Patient Name: [name]. Date of Service: 02/28/2017 2:00 PM Medical Record Number: [id number] Patient Account Number: [id number] Date of Birth/Sex: 05-Mar-1966 (51 y.o. Female) Treating RN: Afful, RN, [name], Velva Harman Primary Care Shawneen Deetz: Delight Stare Other Clinician: Referring Ulus Hazen: Delight Stare Treating Kaicee Scarpino/Extender: Frann Rider in Treatment: 5 Edema Assessment Assessed: [Left: No] [Right: No] Edema: [Left: N] [Right: o] Vascular Assessment Claudication: Claudication Assessment [Right:None] Pulses: Dorsalis Pedis Palpable: [Right:Yes] Posterior Tibial Extremity colors, hair growth, and conditions: Extremity Color: [Right:Mottled] Hair Growth on Extremity: [Right:No] Temperature of Extremity: [Right:Warm] Capillary Refill: [Right:< 3 seconds] Electronic Signature(s) Signed: 02/28/2017 2:58:29 PM By: Regan Lemming [name], RN Entered By: Regan Lemming on 02/28/2017 14:13:46 Dardis, Virgene S. ([id number]) -------------------------------------------------------------------------------- Multi Wound Chart Details Patient Name: [name]. Date of Service: 02/28/2017 2:00 PM Medical Record Number: [id number] Patient Account Number: [id number] Date of Birth/Sex: 1966/04/18 (51 y.o.  Female) Treating RN: Baruch Gouty, RN, [name], Velva Harman Primary Care Jonella Redditt: Delight Stare Other Clinician: Referring Debraann Livingstone: Delight Stare Treating Nataniel Gasper/Extender: Frann Rider in Treatment: 5 Vital Signs Height(in): 67 Pulse(bpm): 93 Weight(lbs): 137 Blood Pressure 87/63 (mmHg): Body Mass Index(BMI): 21 Temperature(F): 98.3 Respiratory Rate 16 (breaths/min): Photos: [1:No Photos] [2:No Photos] [N/A:N/A] Wound Location: [1:Right Malleolus - Lateral] [2:Right Gluteus] [N/A:N/A] Wounding Event: [1:Gradually Appeared] [2:Gradually Appeared] [N/A:N/A] Primary Etiology: [1:Diabetic Wound/Ulcer of  the Lower Extremity] [2:Open Surgical Wound] [N/A:N/A] Comorbid History: [1:Anemia, Arrhythmia, Coronary Artery Disease, Hypotension, Peripheral Arterial Disease, Type II Diabetes, Osteoarthritis] [2:Anemia, Arrhythmia, Coronary Artery Disease, Hypotension, Peripheral Arterial Disease, Type II Diabetes,  Osteoarthritis] [N/A:N/A] Date Acquired: [1:09/23/2016] [2:09/18/2016] [N/A:N/A] Weeks of Treatment: [1:5] [2:5] [N/A:N/A] Wound Status: [1:Open] [2:Open] [N/A:N/A] Measurements L x W x D 1.7x1.9x0.3 [2:5.3x2.3x3] [N/A:N/A] (cm) Area (cm) : [1:2.537] [2:9.574] [N/A:N/A] Volume (cm) : [1:0.761] [2:28.722] [N/A:N/A] % Reduction in Area: [1:35.40%] [2:50.20%] [N/A:N/A] % Reduction in Volume: 61.20% [2:70.10%] [N/A:N/A] Starting Position 1 12 (o'clock): Ending Position 1 [1:12] (o'clock): Maximum Distance 1 1 (cm): Undermining: [1:Yes] [2:No] [N/A:N/A] Classification: [1:Grade 2] [2:Full Thickness Without Exposed Support Structures] [N/A:N/A] Exudate Amount: [1:Medium] [2:Large] [N/A:N/A] Exudate Type: Serosanguineous Serosanguineous N/A Exudate Color: red, brown red, brown N/A Foul Odor After Yes No N/A Cleansing: Odor Anticipated Due to No N/A N/A Product Use: Wound Margin: Distinct, outline attached Distinct, outline attached N/A Granulation Amount: Medium (34-66%) Large  (67-100%) N/A Granulation Quality: Pink Pink, Pale N/A Necrotic Amount: Medium (34-66%) Small (1-33%) N/A Exposed Structures: Fat Layer (Subcutaneous Fat Layer (Subcutaneous N/A Tissue) Exposed: Yes Tissue) Exposed: Yes Fascia: No Fascia: No Tendon: No Tendon: No Muscle: No Muscle: No Joint: No Joint: No Bone: No Bone: No Epithelialization: N/A None N/A Periwound Skin Texture: Induration: Yes Induration: Yes N/A Excoriation: No Excoriation: No Callus: No Callus: No Crepitus: No Crepitus: No Rash: No Rash: No Scarring: No Scarring: No Periwound Skin Maceration: Yes Maceration: Yes N/A Moisture: Dry/Scaly: No Dry/Scaly: No Periwound Skin Color: Atrophie Blanche: No Atrophie Blanche: No N/A Cyanosis: No Cyanosis: No Ecchymosis: No Ecchymosis: No Erythema: No Erythema: No Hemosiderin Staining: No Hemosiderin Staining: No Mottled: No Mottled: No Pallor: No Pallor: No Rubor: No Rubor: No Temperature: No Abnormality No Abnormality N/A Tenderness on Yes Yes N/A Palpation: Wound Preparation: Ulcer Cleansing: Ulcer Cleansing: N/A Rinsed/Irrigated with Rinsed/Irrigated with Saline Saline Topical Anesthetic Topical Anesthetic Applied: Other: lidocaine Applied: Other: lidocaine 4% 4% Treatment Notes Electronic Signature(s) Signed: 02/28/2017 2:35:52 PM By: Christin Fudge MD, FACS SUCHOCKI, Anglia Chauncey Cruel ([id number]) Entered By: Christin Fudge on 02/28/2017 14:35:52 Dutko, [name] ([id number]) -------------------------------------------------------------------------------- [place] Details Patient Name: ENA, DEMARY. Date of Service: 02/28/2017 2:00 PM Medical Record Number: [id number] Patient Account Number: [id number] Date of Birth/Sex: [birth year] (51 y.o. Female) Treating RN: Afful, RN, [name], [employer] Primary Care Anjani Feuerborn: Delight Stare Other Clinician: Referring Telisha Zawadzki: Delight Stare Treating Amaliya Whitelaw/Extender: Frann Rider in Treatment:  5 Active Inactive ` Orientation to the Wound Care Program Nursing Diagnoses: Knowledge deficit related to the wound healing center program Goals: Patient/caregiver will verbalize understanding of the [facility] Program Date Initiated: 01/23/2017 Target Resolution Date: 05/25/2017 Goal Status: Active Interventions: Provide education on orientation to the wound center Notes: ` Peripheral Neuropathy Nursing Diagnoses: Knowledge deficit related to disease process and management of peripheral neurovascular dysfunction Potential alteration in peripheral tissue perfusion (select prior to confirmation of diagnosis) Goals: Patient/caregiver will verbalize understanding of disease process and disease management Date Initiated: 01/23/2017 Target Resolution Date: 05/25/2017 Goal Status: Active Interventions: Assess signs and symptoms of neuropathy upon admission and as needed Provide education on Management of Neuropathy and Related Ulcers Provide education on Management of Neuropathy upon discharge from the [facility] Activities: Test ordered outside of clinic : 01/23/2017 Notes: CHARLETHA, DALPE ([id number]) ` Pressure Nursing Diagnoses: Knowledge deficit related to causes and risk factors for pressure ulcer development Knowledge deficit related to management of pressures ulcers Potential for impaired tissue integrity related  to pressure, friction, moisture, and shear Goals: Patient will remain free from development of additional pressure ulcers Date Initiated: 01/23/2017 Target Resolution Date: 05/25/2017 Goal Status: Active Patient will remain free of pressure ulcers Date Initiated: 01/23/2017 Target Resolution Date: 05/25/2017 Goal Status: Active Patient/caregiver will verbalize risk factors for pressure ulcer development Date Initiated: 01/23/2017 Target Resolution Date: 05/25/2017 Goal Status: Active Patient/caregiver will verbalize understanding of pressure ulcer  management Date Initiated: 01/23/2017 Target Resolution Date: 05/25/2017 Goal Status: Active Interventions: Assess: immobility, friction, shearing, incontinence upon admission and as needed Assess offloading mechanisms upon admission and as needed Assess potential for pressure ulcer upon admission and as needed Provide education on pressure ulcers Treatment Activities: Patient referred for pressure reduction/relief devices : 01/23/2017 Patient referred for seating evaluation to ensure proper offloading : 01/23/2017 Pressure reduction/relief device ordered : 01/23/2017 Notes: ` Wound/Skin Impairment Nursing Diagnoses: Impaired tissue integrity Knowledge deficit related to smoking impact on wound healing Knowledge deficit related to ulceration/compromised skin integrity Goals: Patient/caregiver will verbalize understanding of skin care regimen MARILYNN, EKSTEIN ([id number]) Date Initiated: 01/23/2017 Target Resolution Date: 05/25/2017 Goal Status: Active Ulcer/skin breakdown will have a volume reduction of 30% by week 4 Date Initiated: 01/23/2017 Target Resolution Date: 05/25/2017 Goal Status: Active Ulcer/skin breakdown will have a volume reduction of 50% by week 8 Date Initiated: 01/23/2017 Target Resolution Date: 05/25/2017 Goal Status: Active Ulcer/skin breakdown will have a volume reduction of 80% by week 12 Date Initiated: 01/23/2017 Target Resolution Date: 05/25/2017 Goal Status: Active Ulcer/skin breakdown will heal within 14 weeks Date Initiated: 01/23/2017 Target Resolution Date: 05/25/2017 Goal Status: Active Interventions: Assess patient/caregiver ability to obtain necessary supplies Assess patient/caregiver ability to perform ulcer/skin care regimen upon admission and as needed Assess ulceration(s) every visit Provide education on smoking Provide education on ulcer and skin care Treatment Activities: Patient referred to home care : 01/23/2017 Referred to DME Arne Schlender for dressing supplies  : 01/23/2017 Skin care regimen initiated : 01/23/2017 Topical wound management initiated : 01/23/2017 Notes: Electronic Signature(s) Signed: 02/28/2017 2:58:29 PM By: Regan Lemming [name], RN Entered By: Regan Lemming on 02/28/2017 14:25:11 Delao, [name] ([id number]) -------------------------------------------------------------------------------- Pain Assessment Details Patient Name: [name]. Date of Service: 02/28/2017 2:00 PM Medical Record Number: [id number] Patient Account Number: [id number] Date of Birth/Sex: 05-14-1966 (51 y.o. Female) Treating RN: Afful, RN, [name], Velva Harman Primary Care Semaje Kinker: Delight Stare Other Clinician: Referring Nera Haworth: Delight Stare Treating Mylo Choi/Extender: Frann Rider in Treatment: 5 Active Problems Location of Pain Severity and Description of Pain Patient Has Paino Yes Site Locations Pain Location: Pain in Ulcers Rate the pain. Current Pain Level: 4 Character of Pain Describe the Pain: Tender Pain Management and Medication Current Pain Management: Medication: Yes How does your wound impact your activities of daily livingo Sleep: Yes Bathing: Yes Appetite: Yes Relationship With Others: Yes Bladder Continence: Yes Emotions: Yes Bowel Continence: Yes Work: Yes Toileting: Yes Drive: Yes Dressing: Yes Hobbies: Yes Electronic Signature(s) Signed: 02/28/2017 2:58:29 PM By: Regan Lemming [name], RN Entered By: Regan Lemming on 02/28/2017 14:13:14 Crupi, [name] ([id number]) -------------------------------------------------------------------------------- Patient/Caregiver Education Details Patient Name: [name]. Date of Service: 02/28/2017 2:00 PM Medical Record Number: [id number] Patient Account Number: [id number] Date of Birth/Gender: 01/19/1966 (51 y.o. Female) Treating RN: Baruch Gouty, RN, [name], Velva Harman Primary Care Physician: Delight Stare Other Clinician: Referring Physician: Delight Stare Treating Physician/Extender: Frann Rider in  Treatment: 5 Education Assessment Education Provided To: Patient Education Topics Provided Peripheral Neuropathy: Methods: Explain/Verbal Responses: State content correctly Pressure: Methods: Explain/Verbal Responses:  State content correctly Smoking and Wound Healing: Methods: Explain/Verbal Responses: State content correctly Welcome To [facility]: Methods: Explain/Verbal Wound/Skin Impairment: Methods: Explain/Verbal Responses: State content correctly Electronic Signature(s) Signed: 02/28/2017 2:58:29 PM By: Regan Lemming [name], RN Entered By: Regan Lemming on 02/28/2017 14:42:15 Bonilla, [name] ([id number]) -------------------------------------------------------------------------------- Wound Assessment Details Patient Name: [name]. Date of Service: 02/28/2017 2:00 PM Medical Record Number: [id number] Patient Account Number: [id number] Date of Birth/Sex: Nov 14, 1966 (51 y.o. Female) Treating RN: Afful, RN, [name], [employer] Primary Care Yariah Selvey: Delight Stare Other Clinician: Referring Rayah Fines: Delight Stare Treating Zebulun Deman/Extender: Frann Rider in Treatment: 5 Wound Status Wound Number: 1 Primary Diabetic Wound/Ulcer of the Lower Etiology: Extremity Wound Location: Right Malleolus - Lateral Wound Open Wounding Event: Gradually Appeared Status: Date Acquired: 09/23/2016 Comorbid Anemia, Arrhythmia, Coronary Artery Weeks Of Treatment: 5 History: Disease, Hypotension, Peripheral Clustered Wound: No Arterial Disease, Type II Diabetes, Osteoarthritis Photos Photo Uploaded By: Regan Lemming on 02/28/2017 16:31:48 Wound Measurements Length: (cm) 1.7 Width: (cm) 1.9 Depth: (cm) 0.3 Area: (cm) 2.537 Volume: (cm) 0.761 % Reduction in Area: 35.4% % Reduction in Volume: 61.2% Tunneling: No Undermining: Yes Starting Position (o'clock): 12 Ending Position (o'clock): 12 Maximum Distance: (cm) 1 Wound Description Classification: Grade 2 Wound Margin:  Distinct, outline attached Exudate Amount: Medium Exudate Type: Serosanguineous Exudate Color: red, brown Foul Odor After Cleansing: Yes Due to Product Use: No Slough/Fibrino Yes Wound Bed Neuner, Neta S. ([id number]) Granulation Amount: Medium (34-66%) Exposed Structure Granulation Quality: Pink Fascia Exposed: No Necrotic Amount: Medium (34-66%) Fat Layer (Subcutaneous Tissue) Exposed: Yes Necrotic Quality: Adherent Slough Tendon Exposed: No Muscle Exposed: No Joint Exposed: No Bone Exposed: No Periwound Skin Texture Texture Color No Abnormalities Noted: No No Abnormalities Noted: No Callus: No Atrophie Blanche: No Crepitus: No Cyanosis: No Excoriation: No Ecchymosis: No Induration: Yes Erythema: No Rash: No Hemosiderin Staining: No Scarring: No Mottled: No Pallor: No Moisture Rubor: No No Abnormalities Noted: No Dry / Scaly: No Temperature / Pain Maceration: Yes Temperature: No Abnormality Tenderness on Palpation: Yes Wound Preparation Ulcer Cleansing: Rinsed/Irrigated with Saline Topical Anesthetic Applied: Other: lidocaine 4%, Treatment Notes Wound #1 (Right, Lateral Malleolus) 1. Cleansed with: Clean wound with Normal Saline 4. Dressing Applied: Santyl Ointment 5. Secondary Dressing Applied Bordered Foam Dressing Dry Gauze Saline moistened guaze Electronic Signature(s) Signed: 02/28/2017 2:58:29 PM By: Regan Lemming [name], RN Entered By: Regan Lemming on 02/28/2017 14:20:35 Wysong, [name] ([id number]) -------------------------------------------------------------------------------- Wound Assessment Details Patient Name: [name]. Date of Service: 02/28/2017 2:00 PM Medical Record Number: [id number] Patient Account Number: [id number] Date of Birth/Sex: [birth year] (51 y.o. Female) Treating RN: Afful, RN, [name], [employer] Primary Care Chasta Deshpande: Delight Stare Other Clinician: Referring Miquan Tandon: Delight Stare Treating Orenthal Debski/Extender: Frann Rider in Treatment: 5 Wound Status Wound Number: 2 Primary Open Surgical Wound Etiology: Wound Location: Right Gluteus Wound Open Wounding Event: Gradually Appeared Status: Date Acquired: 09/18/2016 Comorbid Anemia, Arrhythmia, Coronary Artery Weeks Of Treatment: 5 History: Disease, Hypotension, Peripheral Clustered Wound: No Arterial Disease, Type II Diabetes, Osteoarthritis Photos Photo Uploaded By: Regan Lemming on 02/28/2017 16:31:49 Wound Measurements Length: (cm) 5.3 Width: (cm) 2.3 Depth: (cm) 3 Area: (cm) 9.574 Volume: (cm) 28.722 % Reduction in Area: 50.2% % Reduction in Volume: 70.1% Epithelialization: None Tunneling: No Undermining: No Wound Description Full Thickness Without Exposed Classification: Support Structures Wound Margin: Distinct, outline attached Exudate Large Amount: Exudate Type: Serosanguineous Exudate Color: red, brown Foul Odor After Cleansing: No Slough/Fibrino Yes Wound Bed Granulation Amount: Large (67-100%) Exposed Structure  YOSELIN, AMERMAN ([id number]) Granulation Quality: Pink, Pale Fascia Exposed: No Necrotic Amount: Small (1-33%) Fat Layer (Subcutaneous Tissue) Exposed: Yes Necrotic Quality: Adherent Slough Tendon Exposed: No Muscle Exposed: No Joint Exposed: No Bone Exposed: No Periwound Skin Texture Texture Color No Abnormalities Noted: No No Abnormalities Noted: No Callus: No Atrophie Blanche: No Crepitus: No Cyanosis: No Excoriation: No Ecchymosis: No Induration: Yes Erythema: No Rash: No Hemosiderin Staining: No Scarring: No Mottled: No Pallor: No Moisture Rubor: No No Abnormalities Noted: No Dry / Scaly: No Temperature / Pain Maceration: Yes Temperature: No Abnormality Tenderness on Palpation: Yes Wound Preparation Ulcer Cleansing: Rinsed/Irrigated with Saline Topical Anesthetic Applied: Other: lidocaine 4%, Treatment Notes Wound #2 (Right Gluteus) 1. Cleansed with: Clean wound with  Normal Saline 4. Dressing Applied: Santyl Ointment 5. Secondary Dressing Applied Bordered Foam Dressing Dry Gauze Saline moistened guaze Electronic Signature(s) Signed: 02/28/2017 2:58:29 PM By: Regan Lemming [name], RN Entered By: Regan Lemming on 02/28/2017 14:23:20 Sipe, [name] ([id number]) -------------------------------------------------------------------------------- [place] Details Patient Name: [name]. Date of Service: 02/28/2017 2:00 PM Medical Record Number: [id number] Patient Account Number: [id number] Date of Birth/Sex: 09-Feb-1966 (51 y.o. Female) Treating RN: Afful, RN, [name], Velva Harman Primary Care Destynee Stringfellow: Delight Stare Other Clinician: Referring Tiani Stanbery: Delight Stare Treating Tabitha Tupper/Extender: Frann Rider in Treatment: 5 Vital Signs Time Taken: 14:14 Temperature (F): 98.3 Height (in): 67 Pulse (bpm): 93 Weight (lbs): 137 Respiratory Rate (breaths/min): 16 Body Mass Index (BMI): 21.5 Blood Pressure (mmHg): 87/63 Reference Range: 80 - 120 mg / dl Electronic Signature(s) Signed: 02/28/2017 2:58:29 PM By: Regan Lemming [name], RN Entered By: Regan Lemming on 02/28/2017 14:15:29

## 2017-03-05 ENCOUNTER — Encounter: Payer: Self-pay | Admitting: Infectious Disease

## 2017-03-05 ENCOUNTER — Ambulatory Visit (INDEPENDENT_AMBULATORY_CARE_PROVIDER_SITE_OTHER): Payer: Medicare Other | Admitting: Infectious Disease

## 2017-03-05 VITALS — BP 113/69 | HR 97 | Temp 97.4°F | Wt 145.0 lb

## 2017-03-05 DIAGNOSIS — E119 Type 2 diabetes mellitus without complications: Secondary | ICD-10-CM

## 2017-03-05 DIAGNOSIS — M86471 Chronic osteomyelitis with draining sinus, right ankle and foot: Secondary | ICD-10-CM

## 2017-03-05 DIAGNOSIS — Z794 Long term (current) use of insulin: Secondary | ICD-10-CM | POA: Diagnosis not present

## 2017-03-05 DIAGNOSIS — I739 Peripheral vascular disease, unspecified: Secondary | ICD-10-CM | POA: Diagnosis not present

## 2017-03-05 DIAGNOSIS — IMO0001 Reserved for inherently not codable concepts without codable children: Secondary | ICD-10-CM

## 2017-03-05 DIAGNOSIS — M726 Necrotizing fasciitis: Secondary | ICD-10-CM

## 2017-03-05 DIAGNOSIS — M869 Osteomyelitis, unspecified: Secondary | ICD-10-CM

## 2017-03-05 DIAGNOSIS — M86661 Other chronic osteomyelitis, right tibia and fibula: Secondary | ICD-10-CM

## 2017-03-05 HISTORY — DX: Osteomyelitis, unspecified: M86.9

## 2017-03-05 LAB — CBC WITH DIFFERENTIAL/PLATELET
BASOS PCT: 0 %
Basophils Absolute: 0 cells/uL (ref 0–200)
Eosinophils Absolute: 264 cells/uL (ref 15–500)
Eosinophils Relative: 3 %
HCT: 42 % (ref 35.0–45.0)
Hemoglobin: 13.2 g/dL (ref 11.7–15.5)
LYMPHS PCT: 38 %
Lymphs Abs: 3344 cells/uL (ref 850–3900)
MCH: 26.2 pg — ABNORMAL LOW (ref 27.0–33.0)
MCHC: 31.4 g/dL — ABNORMAL LOW (ref 32.0–36.0)
MCV: 83.3 fL (ref 80.0–100.0)
MONOS PCT: 6 %
MPV: 9.9 fL (ref 7.5–12.5)
Monocytes Absolute: 528 cells/uL (ref 200–950)
Neutro Abs: 4664 cells/uL (ref 1500–7800)
Neutrophils Relative %: 53 %
PLATELETS: 289 10*3/uL (ref 140–400)
RBC: 5.04 MIL/uL (ref 3.80–5.10)
RDW: 16.1 % — AB (ref 11.0–15.0)
WBC: 8.8 10*3/uL (ref 3.8–10.8)

## 2017-03-05 LAB — BASIC METABOLIC PANEL WITH GFR
BUN: 14 mg/dL (ref 7–25)
CO2: 27 mmol/L (ref 20–31)
Calcium: 9.4 mg/dL (ref 8.6–10.4)
Chloride: 104 mmol/L (ref 98–110)
Creat: 0.65 mg/dL (ref 0.50–1.05)
GFR, Est African American: >89 mL/min (ref >=60)
GLUCOSE: 232 mg/dL — AB (ref 65–99)
POTASSIUM: 5.2 mmol/L (ref 3.5–5.3)
Sodium: 138 mmol/L (ref 135–146)

## 2017-03-05 MED ORDER — DOXYCYCLINE HYCLATE 100 MG PO TABS: 100.0000 mg | ORAL_TABLET | Freq: Two times a day (BID) | ORAL | 5 refills | Status: DC

## 2017-03-05 MED ORDER — AMOXICILLIN-POT CLAVULANATE 875-125 MG PO TABS: 1.0000 | ORAL_TABLET | Freq: Two times a day (BID) | ORAL | 5 refills | Status: DC

## 2017-03-05 NOTE — Progress Notes (Signed)
Subjective:    Cc: wound on ankle   Patient ID: [name], female    DOB: 10/17/1966, 51 y.o.   MRN: [id number]  HPI   51 y.o. female with  diabetic neuropathy diabetic foot ulcer peripheral vascular disease status post bypass surgery with evidence of osteomyelitis on MRI in the [facility]. She also has a sacral wound that is not healed up completely where she had prior necrotizing fasciitis.I saw her in the hospital and she did not want to undergo surgical intervention. She tells me that Dr Sharol Given is concerned that if he performs at biopsy or debridement on this call it will lead to her losing her leg. I am indeed concerned that she mailed to my need a below the knee or above the knee and dictation to cure osteomyelitis at this site. I proposed the idea of her going on empiric IV antibiotics but she was not very eager to do that at this point time. Therefore will place her on doxycycline twice daily which she has come off of now and also Augmentin and treated for at least 2 months. We will then reevaluate her will get inflammatory markers today as well. She states that her chronic wound in her leg is improving and her husband and her showed me pictures I did not examine the wound myself directly. I did examine her wound over her fibula.  Past Medical History:  Diagnosis Date  . Arthritis   . Asthma   . Chronic back pain   . Depression   . Diabetes mellitus   . Diabetic neuropathy ([place])   . Family history of adverse reaction to anesthesia    mother had difficlty waking   . GERD (gastroesophageal reflux disease)   . Hyperlipidemia   . Joint pain   . Leg pain    With Walking  . PAD (peripheral artery disease) ([place])   . Reflux   . Ulcer ([place])    Foot    Past Surgical History:  Procedure Laterality Date  . ABDOMINAL AORTAGRAM  June 15, 2014  . ABDOMINAL AORTAGRAM N/A 06/15/2014   Procedure: ABDOMINAL Maxcine Ham;  Surgeon: Serafina Mitchell, MD;  Location[facility] CATH LAB;  Service:  Cardiovascular;  Laterality: N/A;  . ABDOMINAL AORTAGRAM N/A 11/22/2014   Procedure: ABDOMINAL AORTAGRAM;  Surgeon: Serafina Mitchell, MD;  Location: [facility] CATH LAB;  Service: Cardiovascular;  Laterality: N/A;  . ABDOMINAL AORTOGRAM W/LOWER EXTREMITY N/A 01/07/2017   Procedure: Abdominal Aortogram w/Lower Extremity;  Surgeon: Serafina Mitchell, MD;  Location: [place] CV LAB;  Service: Cardiovascular;  Laterality: N/A;  . ARTERIAL BYPASS SURGRY  07/05/2010   Right Common Femoral to below knee popliteal BPG  . BACK SURGERY     X's  2  . CARDIAC CATHETERIZATION    . CHOLECYSTECTOMY     Gall Bladder  . CYSTECTOMY Right    foot  . CYSTECTOMY Left    wrist  . INTERCOSTAL NERVE BLOCK  November 2015  . IRRIGATION AND DEBRIDEMENT BUTTOCKS Right 09/30/2016   Procedure: DEBRIDEMENT RIGHT  BUTTOCK WOUND;  Surgeon: Georganna Skeans, MD;  Location: [place];  Service: General;  Laterality: Right;  . left foot surgery    . left wrist cyst removal Left   . PERIPHERAL VASCULAR CATHETERIZATION N/A 05/07/2016   Procedure: Abdominal Aortogram;  Surgeon: Serafina Mitchell, MD;  Location: [place] CV LAB;  Service: Cardiovascular;  Laterality: N/A;  . PERIPHERAL VASCULAR CATHETERIZATION N/A 05/07/2016   Procedure: Lower Extremity  Angiography;  Surgeon: Serafina Mitchell, MD;  Location: [place] CV LAB;  Service: Cardiovascular;  Laterality: N/A;  . PERIPHERAL VASCULAR CATHETERIZATION N/A 05/07/2016   Procedure: Aortic Arch Angiography;  Surgeon: Serafina Mitchell, MD;  Location: [place] CV LAB;  Service: Cardiovascular;  Laterality: N/A;  . PERIPHERAL VASCULAR CATHETERIZATION N/A 05/07/2016   Procedure: Upper Extremity Angiography;  Surgeon: Serafina Mitchell, MD;  Location: [place] CV LAB;  Service: Cardiovascular;  Laterality: N/A;  . PERIPHERAL VASCULAR CATHETERIZATION Right 05/07/2016   Procedure: Peripheral Vascular Balloon Angioplasty;  Surgeon: Serafina Mitchell, MD;  Location: [place] [facility] LAB;  Service:  Cardiovascular;  Laterality: Right;  subclavian  . PERIPHERAL VASCULAR CATHETERIZATION Right 05/07/2016   Procedure: Peripheral Vascular Intervention;  Surgeon: Serafina Mitchell, MD;  Location: [place] [facility] LAB;  Service: Cardiovascular;  Laterality: Right;  External  Iliac  . SKIN GRAFT Right 2012   [name] by Dr. Nils Pyle- Right and Left Ankle  . SPINE SURGERY    . TONSILLECTOMY      Family History  Problem Relation Age of Onset  . Coronary artery disease Mother   . Peripheral vascular disease Mother   . Heart disease Mother     Before age 86  . Other Mother     Venous insuffiency  . Diabetes Mother   . Hyperlipidemia Mother   . Hypertension Mother   . Varicose Veins Mother   . Heart attack Mother     before age 32  . Heart disease Father   . Diabetes Father   . Diabetes Maternal Grandmother   . Diabetes Paternal Grandmother   . Diabetes Paternal Grandfather   . Diabetes Sister   . Hypertension Sister   . Diabetes Brother   . Hypertension Brother       Social History   Social History  . Marital status: Married    Spouse name: N/A  . Number of children: N/A  . Years of education: N/A   Occupational History  . disabled    Social History Main Topics  . Smoking status: Current Every Day Smoker    Packs/day: 1.50    Years: 30.00    Types: Cigarettes  . Smokeless tobacco: Never Used     Comment: 2 cigarettes a day  . Alcohol use No  . Drug use: No  . Sexual activity: Yes    Birth control/ protection: Post-menopausal   Other Topics Concern  . Not on file   Social History Narrative  . No narrative on file    Allergies  Allergen Reactions  . Penicillins Other (See Comments)    Severe Headache (not so sure about this now) Has patient had a PCN reaction causing immediate rash, facial/tongue/throat swelling, SOB or lightheadedness with hypotension: No Has patient had a PCN reaction causing severe rash involving mucus membranes or skin necrosis: No Has patient  had a PCN reaction that required hospitalization: No Has patient had a PCN reaction occurring within the last 10 years: No If all of the above answers are "NO", then may proceed with Cephalosporin use.      Current Outpatient Prescriptions:  .  albuterol (PROVENTIL) 2 MG tablet, Take 2 mg by mouth 3 (three) times daily as needed for shortness of breath. , Disp: , Rfl:  .  aspirin 81 MG tablet, Take 81 mg by mouth daily. , Disp: , Rfl:  .  atorvastatin (LIPITOR) 40 MG tablet, Take 1 tablet (40 mg total) by mouth daily.,  Disp: 90 tablet, Rfl: 3 .  cetirizine (ZYRTEC) 10 MG tablet, Take 10 mg by mouth daily as needed for allergies. Reported on 03/27/2016, Disp: , Rfl:  .  cyclobenzaprine (FLEXERIL) 10 MG tablet, Limit 1 tablet by mouth 1  to  3 times per day if tolerated (Patient taking differently: Take 10 mg by mouth 3 (three) times daily as needed for muscle spasms. ), Disp: 90 tablet, Rfl: 0 .  dicyclomine (BENTYL) 10 MG capsule, Take 10 mg by mouth 4 (four) times daily as needed for spasms., Disp: , Rfl:  .  doxycycline (VIBRA-TABS) 100 MG tablet, Take 1 tablet (100 mg total) by mouth 2 (two) times daily., Disp: 60 tablet, Rfl: 0 .  furosemide (LASIX) 40 MG tablet, Take 40 mg by mouth 2 (two) times daily as needed for fluid. , Disp: , Rfl:  .  HUMALOG KWIKPEN 100 UNIT/ML KiwkPen, Inject 10 Units into the skin 3 (three) times daily. Per sliding scale, Disp: , Rfl:  .  HYDROcodone-acetaminophen (NORCO) 10-325 MG tablet, Limit 1 tablet by mouth per day or twice per day if tolerated (Patient taking differently: Take 1 tablet by mouth See admin instructions. Every 8-12 hours), Disp: 60 tablet, Rfl: 0 .  LANTUS SOLOSTAR 100 UNIT/ML Solostar Pen, Inject 36 Units into the skin 2 (two) times daily. , Disp: , Rfl:  .  meclizine (ANTIVERT) 25 MG tablet, Take 25 mg by mouth 3 (three) times daily as needed for dizziness., Disp: , Rfl:  .  midodrine (PROAMATINE) 10 MG tablet, Take 1 tablet (10 mg total) by  mouth 3 (three) times daily with meals., Disp: 90 tablet, Rfl: 0 .  omeprazole (PRILOSEC) 40 MG capsule, Take 40 mg by mouth daily., Disp: , Rfl:  .  pregabalin (LYRICA) 100 MG capsule, Limit 1 tab by mouth twice a day to 3 times a day if tolerated (Patient taking differently: Take 100 mg by mouth 3 (three) times daily. ), Disp: 90 capsule, Rfl: 0 .  pseudoephedrine-guaifenesin (MUCINEX D) 60-600 MG per tablet, Take 1 tablet by mouth every 12 (twelve) hours as needed for congestion. Reported on 04/08/2016, Disp: , Rfl:  No current facility-administered medications for this visit.   Facility-Administered Medications Ordered in Other Visits:  .  0.9 %  sodium chloride infusion, , Intravenous, Continuous, Serafina Mitchell, MD    Review of Systems  Constitutional: Negative for chills and fever.  HENT: Negative for congestion and sore throat.   Eyes: Negative for photophobia.  Respiratory: Negative for cough, shortness of breath and wheezing.   Cardiovascular: Negative for chest pain, palpitations and leg swelling.  Gastrointestinal: Negative for abdominal pain, blood in stool, constipation, diarrhea, nausea and vomiting.  Genitourinary: Negative for dysuria, flank pain and hematuria.  Musculoskeletal: Positive for myalgias. Negative for back pain.  Skin: Positive for wound. Negative for rash.  Neurological: Negative for dizziness, weakness and headaches.  Hematological: Does not bruise/bleed easily.  Psychiatric/Behavioral: Negative for suicidal ideas.       Objective:   Physical Exam  Constitutional: She is oriented to person, place, and time. She appears well-developed and well-nourished. No distress.  HENT:  Head: Normocephalic and atraumatic.  Mouth/Throat: No oropharyngeal exudate.  Eyes: Conjunctivae and EOM are normal. No scleral icterus.  Neck: Normal range of motion. Neck supple.  Cardiovascular: Normal rate and regular rhythm.   Pulmonary/Chest: Effort normal. No respiratory  distress. She has no wheezes.  Abdominal: She exhibits no distension.  Musculoskeletal: She exhibits no edema or  tenderness.  Neurological: She is alert and oriented to person, place, and time. She exhibits normal muscle tone. Coordination normal.  Skin: Skin is warm. She is not diaphoretic. There is erythema.  Psychiatric: She has a normal mood and affect. Her behavior is normal. Judgment and thought content normal.   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 This is not going to be something obesity to treat with antibiotics I think unfortunately she's gone to need a below the knee or above the knee amputation. She did not want to go on IV antibiotics this point time I will give her Augmentin and doxycycline will reevaluate her in 2 months time. Fortunately she is following very closely with Dr. Con Memos from care as well as Dr. Sharol Given from orthopedics.  Chronic wound over her buttocks: Pictures seemed to show this improving added on examination today at the bedside. Needs also close monitoring.  Peripheral vascular disease post revascularization apparently other revaccination procedure planned.   Diabetes mellitus: Poorly controlled based on most recent A1c.    I spent greater than 40 minutes with the patient including greater than 50% of time in face to face counsel of the patient guarding her chronic osteomyelitis in her fibula her wound and also her wound on her low but I asked from her neck doesn't fascitis and in coordination of her care.

## 2017-03-06 ENCOUNTER — Encounter: Payer: Medicare Other | Admitting: Surgery

## 2017-03-06 DIAGNOSIS — E11621 Type 2 diabetes mellitus with foot ulcer: Secondary | ICD-10-CM | POA: Diagnosis not present

## 2017-03-06 LAB — SEDIMENTATION RATE: Sed Rate: 18 mm/hr (ref 0–30)

## 2017-03-06 LAB — C-REACTIVE PROTEIN: CRP: 7.5 mg/L (ref <8.0)

## 2017-03-07 NOTE — Progress Notes (Addendum)
[name], [name] ([id number]) Visit Report for 03/06/2017 Arrival Information Details Patient Name: [name], [name]. Date of Service: 03/06/2017 9:15 AM Medical Record Number: [id number] Patient Account Number: [id number] Date of Birth/Sex: 02/23/1966 (51 y.o. Female) Treating RN: Afful, RN, [name], Velva Harman Primary Care Phyliss Hulick: Delight Stare Other Clinician: Referring Mihcael Ledee: Delight Stare Treating Sonni Barse/Extender: Frann Rider in Treatment: 6 Visit Information History Since Last Visit All ordered tests and consults were completed: No Patient Arrived: Gilford Rile Added or deleted any medications: No Arrival Time: 08:54 Any new allergies or adverse reactions: No Accompanied By: self Had a fall or experienced change in No Transfer Assistance: None activities of daily living that may affect Patient Identification Verified: Yes risk of falls: Secondary Verification Process Completed: Yes Signs or symptoms of abuse/neglect since last No Patient Requires Transmission-Based No visito Precautions: Hospitalized since last visit: No Patient Has Alerts: No Has Dressing in Place as Prescribed: Yes Pain Present Now: Yes Electronic Signature(s) Signed: 03/06/2017 8:56:19 AM By: Regan Lemming [name], RN Entered By: Regan Lemming on 03/06/2017 08:56:19 Slaymaker, [name] ([id number]) -------------------------------------------------------------------------------- Clinic Level of Care Assessment Details Patient Name: [name]. Date of Service: 03/06/2017 9:15 AM Medical Record Number: [id number] Patient Account Number: [id number] Date of Birth/Sex: 1966/10/22 (51 y.o. Female) Treating RN: Afful, RN, [name], [employer] Primary Care Leata Dominy: Delight Stare Other Clinician: Referring Mana Haberl: Delight Stare Treating Zebediah Beezley/Extender: Frann Rider in Treatment: 6 Clinic Level of Care Assessment Items TOOL 4 Quantity Score []  - Use when only an EandM is performed on FOLLOW-UP visit 0 ASSESSMENTS -  Nursing Assessment / Reassessment X - Reassessment of Co-morbidities (includes updates in patient status) 1 10 X - Reassessment of Adherence to Treatment Plan 1 5 ASSESSMENTS - Wound and Skin Assessment / Reassessment []  - Simple Wound Assessment / Reassessment - one wound 0 X - Complex Wound Assessment / Reassessment - multiple wounds 2 5 []  - Dermatologic / Skin Assessment (not related to wound area) 0 ASSESSMENTS - Focused Assessment []  - Circumferential Edema Measurements - multi extremities 0 []  - Nutritional Assessment / Counseling / Intervention 0 X - Lower Extremity Assessment (monofilament, tuning fork, pulses) 1 5 []  - Peripheral Arterial Disease Assessment (using hand held doppler) 0 ASSESSMENTS - Ostomy and/or Continence Assessment and Care []  - Incontinence Assessment and Management 0 []  - Ostomy Care Assessment and Management (repouching, etc.) 0 PROCESS - Coordination of Care X - Simple Patient / Family Education for ongoing care 1 15 []  - Complex (extensive) Patient / Family Education for ongoing care 0 X - Staff obtains Programmer, systems, Records, Test Results / Process Orders 1 10 []  - Staff telephones HHA, Nursing Homes / Clarify orders / etc 0 []  - Routine Transfer to another Facility (non-emergent condition) 0 [place], Van S. ([id number]) []  - Routine Hospital Admission (non-emergent condition) 0 []  - New Admissions / Biomedical engineer / Ordering NPWT, Apligraf, etc. 0 []  - Emergency Hospital Admission (emergent condition) 0 []  - Simple Discharge Coordination 0 []  - Complex (extensive) Discharge Coordination 0 PROCESS - Special Needs []  - Pediatric / Minor Patient Management 0 []  - Isolation Patient Management 0 []  - Hearing / Language / Visual special needs 0 []  - Assessment of Community assistance (transportation, D/C planning, etc.) 0 []  - Additional assistance / Altered mentation 0 []  - Support Surface(s) Assessment (bed, cushion, seat, etc.) 0 INTERVENTIONS -  Wound Cleansing / Measurement []  - Simple Wound Cleansing - one wound 0 X - Complex Wound Cleansing - multiple wounds 2 5  X - Wound Imaging (photographs - any number of wounds) 1 5 []  - Wound Tracing (instead of photographs) 0 []  - Simple Wound Measurement - one wound 0 X - Complex Wound Measurement - multiple wounds 2 5 INTERVENTIONS - Wound Dressings []  - Small Wound Dressing one or multiple wounds 0 X - Medium Wound Dressing one or multiple wounds 2 15 []  - Large Wound Dressing one or multiple wounds 0 []  - Application of Medications - topical 0 []  - Application of Medications - injection 0 INTERVENTIONS - Miscellaneous []  - External ear exam 0 [name], [name] ([id number]) []  - Specimen Collection (cultures, biopsies, blood, body fluids, etc.) 0 []  - Specimen(s) / Culture(s) sent or taken to Lab for analysis 0 []  - Patient Transfer (multiple staff / Harrel Lemon Lift / Similar devices) 0 []  - Simple Staple / Suture removal (25 or less) 0 []  - Complex Staple / Suture removal (26 or more) 0 []  - Hypo / Hyperglycemic Management (close monitor of Blood Glucose) 0 []  - Ankle / Brachial Index (ABI) - do not check if billed separately 0 X - Vital Signs 1 5 Has the patient been seen at the hospital within the last three years: Yes Total Score: 115 Level Of Care: New/Established - Level 3 Electronic Signature(s) Signed: 03/06/2017 6:06:48 PM By: Regan Lemming [name], RN Entered By: Regan Lemming on 03/06/2017 09:18:36 [name], [name] ([id number]) -------------------------------------------------------------------------------- Encounter Discharge Information Details Patient Name: [name]. Date of Service: 03/06/2017 9:15 AM Medical Record Number: [id number] Patient Account Number: [id number] Date of Birth/Sex: [birth year] (51 y.o. Female) Treating RN: Afful, RN, [name], Velva Harman Primary Care Tommaso Cavitt: Delight Stare Other Clinician: Referring Luc Shammas: Delight Stare Treating Torrell Krutz/Extender: Frann Rider in Treatment: 6 Encounter Discharge Information Items Discharge Pain Level: 0 Discharge Condition: Stable Ambulatory Status: Walker Discharge Destination: Home Transportation: Private Auto Accompanied By: self Schedule Follow-up Appointment: No Medication Reconciliation completed No and provided to Patient/Care Kalle Bernath: Provided on Clinical Summary of Care: 03/06/2017 Form Type Recipient Paper Patient RM Electronic Signature(s) Signed: 03/06/2017 6:01:28 PM By: Regan Lemming [name], RN Previous Signature: 03/06/2017 9:35:01 AM Version By: Ruthine Dose Entered By: Regan Lemming on 03/06/2017 18:01:28 [name], Jun Chauncey Cruel ([id number]) -------------------------------------------------------------------------------- Lower Extremity Assessment Details Patient Name: [name]. Date of Service: 03/06/2017 9:15 AM Medical Record Number: [id number] Patient Account Number: [id number] Date of Birth/Sex: 05-02-1966 (51 y.o. Female) Treating RN: Afful, RN, [name], Velva Harman Primary Care Shahzain Kiester: Delight Stare Other Clinician: Referring Arpan Eskelson: Delight Stare Treating Mykenzi Vanzile/Extender: Frann Rider in Treatment: 6 Edema Assessment Assessed: [Left: No] [Right: No] Edema: [Left: N] [Right: o] Vascular Assessment Claudication: Claudication Assessment [Right:None] Pulses: Dorsalis Pedis Palpable: [Right:Yes] Posterior Tibial Extremity colors, hair growth, and conditions: Extremity Color: [Right:Normal] Hair Growth on Extremity: [Right:No] Temperature of Extremity: [Right:Warm] Capillary Refill: [Right:< 3 seconds] Electronic Signature(s) Signed: 03/06/2017 6:06:48 PM By: Regan Lemming [name], RN Entered By: Regan Lemming on 03/06/2017 08:59:40 Viglione, Juan S. ([id number]) -------------------------------------------------------------------------------- Multi Wound Chart Details Patient Name: [name]. Date of Service: 03/06/2017 9:15 AM Medical Record Number: [id number] Patient  Account Number: [id number] Date of Birth/Sex: 1966-10-17 (51 y.o. Female) Treating RN: Baruch Gouty, RN, [name], Velva Harman Primary Care Everlynn Sagun: Delight Stare Other Clinician: Referring Sheffield Hawker: Delight Stare Treating Yu Peggs/Extender: Frann Rider in Treatment: 6 Vital Signs Height(in): 67 Pulse(bpm): 90 Weight(lbs): 137 Blood Pressure 77/45 (mmHg): Body Mass Index(BMI): 21 Temperature(F): 98.1 Respiratory Rate 16 (breaths/min): Photos: [1:No Photos] [2:No Photos] [N/A:N/A] Wound Location: [1:Right Malleolus - Lateral] [2:Right Gluteus] [N/A:N/A] Wounding Event: [  1:Gradually Appeared] [2:Gradually Appeared] [N/A:N/A] Primary Etiology: [1:Diabetic Wound/Ulcer of the Lower Extremity] [2:Open Surgical Wound] [N/A:N/A] Comorbid History: [1:Anemia, Arrhythmia, Coronary Artery Disease, Hypotension, Peripheral Arterial Disease, Type II Diabetes, Osteoarthritis] [2:Anemia, Arrhythmia, Coronary Artery Disease, Hypotension, Peripheral Arterial Disease, Type II Diabetes,  Osteoarthritis] [N/A:N/A] Date Acquired: [1:09/23/2016] [2:09/18/2016] [N/A:N/A] Weeks of Treatment: [1:6] [2:6] [N/A:N/A] Wound Status: [1:Open] [2:Open] [N/A:N/A] Measurements L x W x D 1.7x2.2x0.3 [2:6.2x2x2.5] [N/A:N/A] (cm) Area (cm) : [1:2.937] [2:9.739] [N/A:N/A] Volume (cm) : [1:0.881] [2:24.347] [N/A:N/A] % Reduction in Area: [1:25.20%] [2:49.30%] [N/A:N/A] % Reduction in Volume: 55.10% [2:74.70%] [N/A:N/A] Starting Position 1 12 (o'clock): Ending Position 1 [1:12] (o'clock): Maximum Distance 1 1.8 (cm): Undermining: [1:Yes] [2:No] [N/A:N/A] Classification: [1:Grade 2] [2:Full Thickness Without Exposed Support Structures] [N/A:N/A] Exudate Amount: [1:Medium] [2:Large] [N/A:N/A] Exudate Type: Serosanguineous Serosanguineous N/A Exudate Color: red, brown red, brown N/A Wound Margin: Distinct, outline attached Distinct, outline attached N/A Granulation Amount: Medium (34-66%) Large (67-100%) N/A Granulation  Quality: Pink Pink, Pale N/A Necrotic Amount: Medium (34-66%) Small (1-33%) N/A Exposed Structures: Fat Layer (Subcutaneous Fat Layer (Subcutaneous N/A Tissue) Exposed: Yes Tissue) Exposed: Yes Fascia: No Fascia: No Tendon: No Tendon: No Muscle: No Muscle: No Joint: No Joint: No Bone: No Bone: No Epithelialization: None None N/A Periwound Skin Texture: Induration: Yes Induration: Yes N/A Excoriation: No Excoriation: No Callus: No Callus: No Crepitus: No Crepitus: No Rash: No Rash: No Scarring: No Scarring: No Periwound Skin Maceration: Yes Maceration: Yes N/A Moisture: Dry/Scaly: No Dry/Scaly: No Periwound Skin Color: Atrophie Blanche: No Atrophie Blanche: No N/A Cyanosis: No Cyanosis: No Ecchymosis: No Ecchymosis: No Erythema: No Erythema: No Hemosiderin Staining: No Hemosiderin Staining: No Mottled: No Mottled: No Pallor: No Pallor: No Rubor: No Rubor: No Temperature: No Abnormality No Abnormality N/A Tenderness on Yes Yes N/A Palpation: Wound Preparation: Ulcer Cleansing: Ulcer Cleansing: N/A Rinsed/Irrigated with Rinsed/Irrigated with Saline Saline Topical Anesthetic Topical Anesthetic Applied: Other: lidocaine Applied: Other: lidocaine 4% 4% Treatment Notes Electronic Signature(s) Signed: 03/06/2017 9:30:23 AM By: Christin Fudge MD, FACS Entered By: Christin Fudge on 03/06/2017 09:30:23 [name], [name] ([id number]) -------------------------------------------------------------------------------- [place] Details Patient Name: [name], [name]. Date of Service: 03/06/2017 9:15 AM Medical Record Number: [id number] Patient Account Number: [id number] Date of Birth/Sex: 1966/04/20 (51 y.o. Female) Treating RN: Afful, RN, [name], [employer] Primary Care Ayiana Winslett: Delight Stare Other Clinician: Referring Cinzia Devos: Delight Stare Treating Amandalee Lacap/Extender: Frann Rider in Treatment: 6 Active Inactive ` Orientation to the Wound Care  Program Nursing Diagnoses: Knowledge deficit related to the wound healing center program Goals: Patient/caregiver will verbalize understanding of the [facility] Program Date Initiated: 01/23/2017 Target Resolution Date: 05/25/2017 Goal Status: Active Interventions: Provide education on orientation to the wound center Notes: ` Peripheral Neuropathy Nursing Diagnoses: Knowledge deficit related to disease process and management of peripheral neurovascular dysfunction Potential alteration in peripheral tissue perfusion (select prior to confirmation of diagnosis) Goals: Patient/caregiver will verbalize understanding of disease process and disease management Date Initiated: 01/23/2017 Target Resolution Date: 05/25/2017 Goal Status: Active Interventions: Assess signs and symptoms of neuropathy upon admission and as needed Provide education on Management of Neuropathy and Related Ulcers Provide education on Management of Neuropathy upon discharge from the [place] Activities: Test ordered outside of clinic : 01/23/2017 Notes: [name], [name] ([id number]) ` Pressure Nursing Diagnoses: Knowledge deficit related to causes and risk factors for pressure ulcer development Knowledge deficit related to management of pressures ulcers Potential for impaired tissue integrity related to pressure, friction, moisture, and shear Goals: Patient will remain  free from development of additional pressure ulcers Date Initiated: 01/23/2017 Target Resolution Date: 05/25/2017 Goal Status: Active Patient will remain free of pressure ulcers Date Initiated: 01/23/2017 Target Resolution Date: 05/25/2017 Goal Status: Active Patient/caregiver will verbalize risk factors for pressure ulcer development Date Initiated: 01/23/2017 Target Resolution Date: 05/25/2017 Goal Status: Active Patient/caregiver will verbalize understanding of pressure ulcer management Date Initiated: 01/23/2017 Target Resolution  Date: 05/25/2017 Goal Status: Active Interventions: Assess: immobility, friction, shearing, incontinence upon admission and as needed Assess offloading mechanisms upon admission and as needed Assess potential for pressure ulcer upon admission and as needed Provide education on pressure ulcers Treatment Activities: Patient referred for pressure reduction/relief devices : 01/23/2017 Patient referred for seating evaluation to ensure proper offloading : 01/23/2017 Pressure reduction/relief device ordered : 01/23/2017 Notes: ` Wound/Skin Impairment Nursing Diagnoses: Impaired tissue integrity Knowledge deficit related to smoking impact on wound healing Knowledge deficit related to ulceration/compromised skin integrity Goals: Patient/caregiver will verbalize understanding of skin care regimen [name], [name] ([id number]) Date Initiated: 01/23/2017 Target Resolution Date: 05/25/2017 Goal Status: Active Ulcer/skin breakdown will have a volume reduction of 30% by week 4 Date Initiated: 01/23/2017 Target Resolution Date: 05/25/2017 Goal Status: Active Ulcer/skin breakdown will have a volume reduction of 50% by week 8 Date Initiated: 01/23/2017 Target Resolution Date: 05/25/2017 Goal Status: Active Ulcer/skin breakdown will have a volume reduction of 80% by week 12 Date Initiated: 01/23/2017 Target Resolution Date: 05/25/2017 Goal Status: Active Ulcer/skin breakdown will heal within 14 weeks Date Initiated: 01/23/2017 Target Resolution Date: 05/25/2017 Goal Status: Active Interventions: Assess patient/caregiver ability to obtain necessary supplies Assess patient/caregiver ability to perform ulcer/skin care regimen upon admission and as needed Assess ulceration(s) every visit Provide education on smoking Provide education on ulcer and skin care Treatment Activities: Patient referred to home care : 01/23/2017 Referred to DME Taisa Deloria for dressing supplies : 01/23/2017 Skin care regimen initiated :  01/23/2017 Topical wound management initiated : 01/23/2017 Notes: Electronic Signature(s) Signed: 03/06/2017 6:06:48 PM By: Regan Lemming [name], RN Entered By: Regan Lemming on 03/06/2017 09:11:35 [name], [name] ([id number]) -------------------------------------------------------------------------------- Pain Assessment Details Patient Name: [name]. Date of Service: 03/06/2017 9:15 AM Medical Record Number: [id number] Patient Account Number: [id number] Date of Birth/Sex: 10/25/1966 (51 y.o. Female) Treating RN: Afful, RN, [name], Velva Harman Primary Care Hardy Harcum: Delight Stare Other Clinician: Referring Tiffini Blacksher: Delight Stare Treating Pratt Bress/Extender: Frann Rider in Treatment: 6 Active Problems Location of Pain Severity and Description of Pain Patient Has Paino Yes Site Locations Pain Location: Pain in Ulcers Rate the pain. Current Pain Level: 4 Character of Pain Describe the Pain: Tender Pain Management and Medication Current Pain Management: How does your wound impact your activities of daily livingo Sleep: Yes Bathing: Yes Appetite: Yes Relationship With Others: Yes Bladder Continence: Yes Emotions: Yes Bowel Continence: Yes Work: Yes Toileting: Yes Drive: Yes Dressing: Yes Hobbies: Yes Electronic Signature(s) Signed: 03/06/2017 6:06:48 PM By: Regan Lemming [name], RN Entered By: Regan Lemming on 03/06/2017 08:59:13 [name], [name] ([id number]) -------------------------------------------------------------------------------- Patient/Caregiver Education Details Patient Name: [name]. Date of Service: 03/06/2017 9:15 AM Medical Record Number: [id number] Patient Account Number: [id number] Date of Birth/Gender: [birth year] (51 y.o. Female) Treating RN: Afful, RN, [name], Velva Harman Primary Care Physician: Delight Stare Other Clinician: Referring Physician: Delight Stare Treating Physician/Extender: Frann Rider in Treatment: 6 Education Assessment Education Provided  To: Patient Education Topics Provided Peripheral Neuropathy: Methods: Explain/Verbal Responses: State content correctly Pressure: Methods: Explain/Verbal Responses: State content correctly Smoking and Wound Healing: Methods: Explain/Verbal Responses: State content  correctly Welcome To [facility]: Methods: Explain/Verbal Responses: State content correctly Wound/Skin Impairment: Methods: Explain/Verbal Responses: State content correctly Electronic Signature(s) Signed: 03/06/2017 6:06:48 PM By: Regan Lemming [name], RN Entered By: Regan Lemming on 03/06/2017 18:01:50 [place], [name] ([id number]) -------------------------------------------------------------------------------- Wound Assessment Details Patient Name: [name]. Date of Service: 03/06/2017 9:15 AM Medical Record Number: [id number] Patient Account Number: [id number] Date of Birth/Sex: [birth year] (51 y.o. Female) Treating RN: Afful, RN, [name], [employer] Primary Care Jerardo Costabile: Delight Stare Other Clinician: Referring Gordan Grell: Delight Stare Treating Brittani Purdum/Extender: Frann Rider in Treatment: 6 Wound Status Wound Number: 1 Primary Diabetic Wound/Ulcer of the Lower Etiology: Extremity Wound Location: Right Malleolus - Lateral Wound Open Wounding Event: Gradually Appeared Status: Date Acquired: 09/23/2016 Comorbid Anemia, Arrhythmia, Coronary Artery Weeks Of Treatment: 6 History: Disease, Hypotension, Peripheral Clustered Wound: No Arterial Disease, Type II Diabetes, Osteoarthritis Photos Photo Uploaded By: Regan Lemming on 03/06/2017 14:11:09 Wound Measurements Length: (cm) 1.7 Width: (cm) 2.2 Depth: (cm) 0.3 Area: (cm) 2.937 Volume: (cm) 0.881 % Reduction in Area: 25.2% % Reduction in Volume: 55.1% Epithelialization: None Tunneling: No Undermining: Yes Starting Position (o'clock): 12 Ending Position (o'clock): 12 Maximum Distance: (cm) 1.8 Wound Description Classification: Grade 2 Wound  Margin: Distinct, outline attached Exudate Amount: Medium Exudate Type: Serosanguineous Exudate Color: red, brown [name], [name] ([id number]) Foul Odor After Cleansing: No Slough/Fibrino Yes Wound Bed Granulation Amount: Medium (34-66%) Exposed Structure Granulation Quality: Pink Fascia Exposed: No Necrotic Amount: Medium (34-66%) Fat Layer (Subcutaneous Tissue) Exposed: Yes Necrotic Quality: Adherent Slough Tendon Exposed: No Muscle Exposed: No Joint Exposed: No Bone Exposed: No Periwound Skin Texture Texture Color No Abnormalities Noted: No No Abnormalities Noted: No Callus: No Atrophie Blanche: No Crepitus: No Cyanosis: No Excoriation: No Ecchymosis: No Induration: Yes Erythema: No Rash: No Hemosiderin Staining: No Scarring: No Mottled: No Pallor: No Moisture Rubor: No No Abnormalities Noted: No Dry / Scaly: No Temperature / Pain Maceration: Yes Temperature: No Abnormality Tenderness on Palpation: Yes Wound Preparation Ulcer Cleansing: Rinsed/Irrigated with Saline Topical Anesthetic Applied: Other: lidocaine 4%, Treatment Notes Wound #1 (Right, Lateral Malleolus) 1. Cleansed with: Clean wound with Normal Saline 3. Peri-wound Care: Skin Prep 4. Dressing Applied: Prisma Ag 5. Secondary Dressing Applied Bordered Foam Dressing Dry Gauze Electronic Signature(s) Signed: 03/06/2017 6:06:48 PM By: Regan Lemming [name], RN Entered By: Regan Lemming on 03/06/2017 09:07:06 [place], [name] ([id number]) -------------------------------------------------------------------------------- Wound Assessment Details Patient Name: [name]. Date of Service: 03/06/2017 9:15 AM Medical Record Number: [id number] Patient Account Number: [id number] Date of Birth/Sex: 1965/12/14 (51 y.o. Female) Treating RN: Afful, RN, [name], [employer] Primary Care Damyn Weitzel: Delight Stare Other Clinician: Referring Shaun Runyon: Delight Stare Treating Lilliana Turner/Extender: Frann Rider in Treatment:  6 Wound Status Wound Number: 2 Primary Open Surgical Wound Etiology: Wound Location: Right Gluteus Wound Open Wounding Event: Gradually Appeared Status: Date Acquired: 09/18/2016 Comorbid Anemia, Arrhythmia, Coronary Artery Weeks Of Treatment: 6 History: Disease, Hypotension, Peripheral Clustered Wound: No Arterial Disease, Type II Diabetes, Osteoarthritis Photos Photo Uploaded By: Regan Lemming on 03/06/2017 14:11:33 Wound Measurements Length: (cm) 6.2 Width: (cm) 2 Depth: (cm) 2.5 Area: (cm) 9.739 Volume: (cm) 24.347 % Reduction in Area: 49.3% % Reduction in Volume: 74.7% Epithelialization: None Tunneling: No Undermining: No Wound Description Full Thickness Without Exposed Classification: Support Structures Wound Margin: Distinct, outline attached Exudate Large Amount: Exudate Type: Serosanguineous Exudate Color: red, brown Foul Odor After Cleansing: No Slough/Fibrino Yes Wound Bed Granulation Amount: Large (67-100%) Exposed Structure Holtman, Clelia S. ([id number]) Granulation Quality: Pink, Pale Fascia  Exposed: No Necrotic Amount: Small (1-33%) Fat Layer (Subcutaneous Tissue) Exposed: Yes Necrotic Quality: Adherent Slough Tendon Exposed: No Muscle Exposed: No Joint Exposed: No Bone Exposed: No Periwound Skin Texture Texture Color No Abnormalities Noted: No No Abnormalities Noted: No Callus: No Atrophie Blanche: No Crepitus: No Cyanosis: No Excoriation: No Ecchymosis: No Induration: Yes Erythema: No Rash: No Hemosiderin Staining: No Scarring: No Mottled: No Pallor: No Moisture Rubor: No No Abnormalities Noted: No Dry / Scaly: No Temperature / Pain Maceration: Yes Temperature: No Abnormality Tenderness on Palpation: Yes Wound Preparation Ulcer Cleansing: Rinsed/Irrigated with Saline Topical Anesthetic Applied: Other: lidocaine 4%, Treatment Notes Wound #2 (Right Gluteus) 1. Cleansed with: Clean wound with Normal Saline 3. Peri-wound  Care: Skin Prep 4. Dressing Applied: Prisma Ag 5. Secondary Dressing Applied Bordered Foam Dressing Dry Gauze Electronic Signature(s) Signed: 03/06/2017 6:06:48 PM By: Regan Lemming [name], RN Entered By: Regan Lemming on 03/06/2017 09:10:54 [place], [name] ([id number]) -------------------------------------------------------------------------------- [place] Details Patient Name: [name]. Date of Service: 03/06/2017 9:15 AM Medical Record Number: [id number] Patient Account Number: [id number] Date of Birth/Sex: 29-Oct-1966 (51 y.o. Female) Treating RN: Afful, RN, [name], Velva Harman Primary Care Alanah Sakuma: Delight Stare Other Clinician: Referring Damia Bobrowski: Delight Stare Treating Braxtyn Dorff/Extender: Frann Rider in Treatment: 6 Vital Signs Time Taken: 08:58 Temperature (F): 98.1 Height (in): 67 Pulse (bpm): 90 Weight (lbs): 137 Respiratory Rate (breaths/min): 16 Body Mass Index (BMI): 21.5 Blood Pressure (mmHg): 77/45 Reference Range: 80 - 120 mg / dl Electronic Signature(s) Signed: 03/06/2017 6:06:48 PM By: Regan Lemming [name], RN Entered By: Regan Lemming on 03/06/2017 09:01:45

## 2017-03-08 NOTE — Progress Notes (Signed)
[name], [name] ([id number]) Visit Report for 03/06/2017 Chief Complaint Document Details Patient Name: [name], [name]. Date of Service: 03/06/2017 9:15 AM Medical Record Number: [id number] Patient Account Number: [id number] Date of Birth/Sex: 1966-05-30 (51 y.o. Female) Treating RN: Afful, RN, [name], Velva Harman Primary Care Provider: Delight Stare Other Clinician: Referring Provider: Delight Stare Treating Provider/Extender: Frann [name] in Treatment: 6 Information Obtained from: Patient Chief Complaint Patients presents for treatment of an open diabetic ulcer with an arterial etiology on her right lateral ankle and right gluteal area for about 4 months Electronic Signature([name]) Signed: 03/06/2017 9:30:31 AM By: Christin Fudge MD, FACS Entered By: Christin Fudge on 03/06/2017 09:30:31 [name], [name] ([id number]) -------------------------------------------------------------------------------- HPI Details Patient Name: [name]. Date of Service: 03/06/2017 9:15 AM Medical Record Number: [id number] Patient Account Number: [id number] Date of Birth/Sex: [birth year] (51 y.o. Female) Treating RN: Afful, RN, [name], Velva Harman Primary Care Provider: Delight Stare Other Clinician: Referring Provider: Delight Stare Treating Provider/Extender: Frann [name] in Treatment: 6 History of Present Illness Location: right lateral ankle and right gluteal area Quality: Patient reports experiencing a sharp pain to affected area([name]). Severity: Patient states wound are getting worse. Duration: Patient has had the wound for > 4 months prior to seeking treatment at the wound center Timing: Pain in wound is constant (hurts all the time) Context: The wound occurred when the patient was in hospital with a necrotizing fasciitis of the right gluteal area and a ulceration on the right ankle Modifying Factors: Other treatment([name]) tried include:admitted to the hospital for IV antibiotics and a full workup and has also  had a recent angiogram Associated Signs and Symptoms: Patient reports having increase discharge. HPI Description: 51 year old patient was sent to [place] from [facility] where she was seen by Dr. Sherril Cong for a left ankle ulceration and was recently hospitalized with hypotension and sepsis. She was treated with IV antibiotics and has been scheduled to see Dr. Sharol Given. She was seen by vascular surgery who recommended a femoral bypass but surgery has been delayed until her sacral wound from last year'[name] necrotizing fasciitis has healed. Was seeing the wound care team at [facility] but wanted to change over. She is a smoker and smokes a pack of cigarettes a day The patient was recently admitted in [place] between February 2 and February 14. She had a follow- up to see vascular surgery, orthopedic surgery and infectious disease. 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 Review of the electronic medical records reveals that Dr. 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 An MRI showed edema in the bone which was consistent with osteomyelitis and the chronic ulcer and may need surgical intervention. She also had a sacral decubitus ulcer which was treated with the wound VAC. On 01/07/2017 she was taken up by the vascular surgeon Dr. Trula Slade for an abdominal aortogram and bilateral lower extremity runoff, for a history of having bilateral femoropopliteal bypass graft as well as external iliac stenting on the right and stenting of her bypass graft. She had developed a  nonhealing wound on her right ankle and there was a possibility of a femoral  occlusion. the findings were noted and the impression was a surgical revascularization with a aorto bifemoral bypass graft. Both the femoropopliteal bypass grafts were patent. 01/31/2017 -- x-ray of the right hip and pelvis -- IMPRESSION:No radiographic evidence of acute osteomyelitis. Normal-appearing right hip joint space for age. No acute bony abnormality of the hip. 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 Since seeing her last week she has not had an appointment with infectious disease or the orthopedic specialist yet. CARRERA, KIESEL ([id number]) 02/06/2017 -- the patient missed a couple of appointments yesterday due to the weather but other than that has apparently given up smoking for the last 5 days. 02/13/2017 -- she has rescheduled her infectious disease appointment and also the appointment with the orthopedic surgeon Dr. Sharol Given 03/06/2017 -- she was seen by Dr. Lucianne Lei dam of infectious disease on 03/05/2017 and he recommendedIV antibiotics but the patient did not want to have that and he has given her Augmentin and doxycycline and will reevaluate her in 2 months time. Electronic Signature([name]) Signed: 03/06/2017 9:56:53 AM By: Christin Fudge MD, FACS Entered By: Christin Fudge on 03/06/2017 09:56:53 [name], [name] ([id number]) -------------------------------------------------------------------------------- Physical Exam Details Patient Name: [name], [name]. Date of Service: 03/06/2017 9:15 AM Medical Record Number: [id number] Patient Account Number: [id number] Date of Birth/Sex: 05/14/1966 (51 y.o. Female) Treating RN: Baruch Gouty, RN, [name], [employer] Primary Care Provider: Delight Stare Other Clinician: Referring Provider: Delight Stare Treating Provider/Extender: Frann [name] in Treatment: 6 Constitutional . Pulse regular. Respirations normal and  unlabored. Afebrile. . Eyes Nonicteric. Reactive to light. Ears, Nose, Mouth, and Throat Lips, teeth, and gums WNL.Marland Kitchen Moist mucosa without lesions. Neck supple and nontender. No palpable supraclavicular or cervical adenopathy. Normal sized without goiter. Respiratory WNL. No retractions.. Breath sounds WNL, No rubs, rales, rhonchi, or wheeze.. Cardiovascular Heart rhythm and rate regular, no murmur or gallop.. Pedal Pulses WNL. No clubbing, cyanosis or edema. Chest Breasts symmetical and no nipple discharge.. Breast tissue WNL, no masses, lumps, or tenderness.. Lymphatic No adneopathy. No adenopathy. No adenopathy. Musculoskeletal Adexa without tenderness or enlargement.. Digits and nails w/o clubbing, cyanosis, infection, petechiae, ischemia, or inflammatory conditions.. Integumentary (Hair, Skin) No suspicious lesions. No crepitus or fluctuance. No peri-wound warmth or erythema. No masses.Marland Kitchen Psychiatric Judgement and insight Intact.. No evidence of depression, anxiety, or agitation.. Notes the right gluteal wound is looking excellent with the superior half of it having no hyper granulation tissue and the inferior half of it is deep but not down to bone. The wound did not need any sharp debridement. The right ankle wound continues to have minimal circumferential undermining but overall has increased granulation tissue and is looking much better. No Sharp debridement was required today. Electronic Signature([name]) Signed: 03/06/2017 9:57:54 AM By: Christin Fudge MD, FACS Entered By: Christin Fudge on 03/06/2017 09:57:52 [name], [name] ([id number]) -------------------------------------------------------------------------------- Physician Orders Details Patient Name: JAMETTA, MOOREHEAD. Date of Service: 03/06/2017 9:15 AM Medical Record Number: [id number] Patient Account Number: [id number] Date of Birth/Sex: 12-25-1965 (51 y.o. Female) Treating RN: Afful, RN, [name], Velva Harman Primary Care Provider:  Delight Stare Other Clinician: Referring Provider: Delight Stare Treating Provider/Extender: Frann [name] in Treatment: 6 Verbal / Phone Orders: No Diagnosis Coding Wound Cleansing Wound #1 Right,Lateral Malleolus o Clean wound with Normal Saline. Wound #2 Right Gluteus o Clean wound with Normal Saline. Anesthetic Wound #1 Right,Lateral Malleolus o Topical Lidocaine 4% cream applied to wound bed prior to debridement Wound #  2 Right Gluteus o Topical Lidocaine 4% cream applied to wound bed prior to debridement - in clinic Skin Barriers/Peri-Wound Care Wound #1 Right,Lateral Malleolus o Skin Prep - in clinic Wound #2 Right Gluteus o Skin Prep Primary Wound Dressing Wound #1 Right,Lateral Malleolus o Prisma Ag Wound #2 Right Gluteus o Hydrafera Blue - apply to open granulated area. o Prisma Ag - please apply to the base of the wound before the black foam Secondary Dressing Wound #1 Right,Lateral Malleolus o Dry Gauze o Boardered Foam Dressing - in clinic until [facility] applies VAC Wound #2 Right Gluteus o Boardered Foam Dressing - in [facility] [place] only - HHRN to replace NPWT tomorrow HUONG, LUTHI ([id number]) Dressing Change Frequency Wound #1 Right,Lateral Malleolus o Change dressing every day. Wound #2 Right Gluteus o Change Dressing Monday, Wednesday, Friday Follow-up Appointments Wound #1 Right,Lateral Malleolus o Return Appointment in 1 week. Wound #2 Right Gluteus o Return Appointment in 1 week. Additional Orders / Instructions Wound #1 Right,Lateral Malleolus o Stop Smoking o Increase protein intake. o Activity as tolerated o Other: - Please add vitamin A, vitamin C and zinc supplements to your diet Wound #2 Right Gluteus o Stop Smoking o Increase protein intake. o Activity as tolerated o Other: - Please add vitamin A, vitamin C and zinc supplements to your diet Home Health Wound #1 [place] [facility] [place] Nurse may visit PRN to address patientos wound care needs. o FACE TO FACE ENCOUNTER: MEDICARE and MEDICAID PATIENTS: I certify that this patient is under my care and that I had a face-to-face encounter that meets the physician face-to-face encounter requirements with this patient on this date. The encounter with the patient was in whole or in part for the following MEDICAL CONDITION: (primary reason for [place]) MEDICAL NECESSITY: I certify, that based on my findings, NURSING services are a medically necessary home health service. HOME BOUND STATUS: I certify that my clinical findings support that this patient is homebound (i.e., Due to illness or injury, pt requires aid of supportive devices such as crutches, cane, wheelchairs, walkers, the use of special transportation or the assistance of another person to leave their place of residence. There is a normal inability to leave the home and doing so requires considerable and taxing effort. Other absences are for medical reasons / religious services and are infrequent or of short duration when for other reasons). o If current dressing causes regression in wound condition, may D/C ordered dressing product/[name] and apply Normal Saline Moist Dressing daily until next [place] / Other MD appointment. [place] of regression in wound condition at [phone]. KIEARRA, OYERVIDES ([id number]) o Please direct any NON-WOUND related issues/requests for orders to patient'[name] Primary Care Physician Wound #2 Right [place] [facility] [place] Nurse may visit PRN to address patientos wound care needs. o FACE TO FACE ENCOUNTER: MEDICARE and MEDICAID PATIENTS: I certify that this patient is under my care and that I had a face-to-face encounter that meets the physician face-to-face encounter requirements  with this patient on this date. The encounter with the patient was in whole or in part for the following MEDICAL CONDITION: (primary reason for [place]) MEDICAL NECESSITY: I certify, that based on my findings, NURSING services are a medically necessary home health service. HOME BOUND STATUS: I certify that my clinical findings support that this patient is homebound (i.e.,  Due to illness or injury, pt requires aid of supportive devices such as crutches, cane, wheelchairs, walkers, the use of special transportation or the assistance of another person to leave their place of residence. There is a normal inability to leave the home and doing so requires considerable and taxing effort. Other absences are for medical reasons / religious services and are infrequent or of short duration when for other reasons). o If current dressing causes regression in wound condition, may D/C ordered dressing product/[name] and apply Normal Saline Moist Dressing daily until next [place] / Other MD appointment. [place] of regression in wound condition at [phone]. o Please direct any NON-WOUND related issues/requests for orders to patient'[name] Primary Care Physician Negative Pressure Wound Therapy Wound #2 Right Gluteus o Wound VAC settings at 125/130 mmHg continuous pressure. Use BLACK/GREEN foam to wound cavity. Use WHITE foam to fill any tunnel/[name] and/or undermining. Change VAC dressing 3 X WEEK. Change canister as indicated when full. Nurse may titrate settings and frequency of dressing changes as clinically indicated. - black foam to wound cavity only - use Hydrafera blue to granulated tissue Medications-please add to medication list. Wound #1 Right,Lateral Malleolus o Santyl Enzymatic Ointment Electronic Signature([name]) Signed: 03/06/2017 4:16:12 PM By: Christin Fudge MD, FACS Signed: 03/06/2017 6:06:48 PM By: Regan Lemming [name], RN Entered By: Regan Lemming on 03/06/2017  09:20:33 [name], [name] ([id number]) -------------------------------------------------------------------------------- Problem List Details Patient Name: BRIENA, SWINGLER. Date of Service: 03/06/2017 9:15 AM Medical Record Number: [id number] Patient Account Number: [id number] Date of Birth/Sex: Jun 11, 1966 (51 y.o. Female) Treating RN: Afful, RN, [name], Velva Harman Primary Care Provider: Delight Stare Other Clinician: Referring Provider: Delight Stare Treating Provider/Extender: Frann [name] in Treatment: 6 Active Problems ICD-10 Encounter Code Description Active Date Diagnosis E11.621 Type 2 diabetes mellitus with foot ulcer 01/23/2017 Yes L89.314 Pressure ulcer of right buttock, stage 4 01/23/2017 Yes L97.312 Non-pressure chronic ulcer of right ankle with fat layer 01/23/2017 Yes exposed F17.218 Nicotine dependence, cigarettes, with other nicotine- 01/23/2017 Yes induced disorders I70.233 Atherosclerosis of native arteries of right leg with 01/23/2017 Yes ulceration of ankle M86.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, right ankle and foot 01/23/2017 Yes Inactive Problems Resolved Problems Electronic Signature([name]) Signed: 03/06/2017 9:30:18 AM By: Christin Fudge MD, FACS Entered By: Christin Fudge on 03/06/2017 09:30:17 Sabic, [name] ([id number]) -------------------------------------------------------------------------------- Progress Note Details Patient Name: [name]. Date of Service: 03/06/2017 9:15 AM Medical Record Number: [id number] Patient Account Number: [id number] Date of Birth/Sex: 01-20-1966 (51 y.o. Female) Treating RN: Afful, RN, [name], [employer] Primary Care Provider: Delight Stare Other Clinician: Referring Provider: Delight Stare Treating Provider/Extender: Frann [name] in Treatment: 6 Subjective Chief Complaint Information obtained from Patient Patients presents for treatment of an open diabetic ulcer with an arterial etiology on her right lateral ankle and right gluteal  area for about 4 months History of Present Illness (HPI) The following HPI elements were documented for the patient'[name] wound: Location: right lateral ankle and right gluteal area Quality: Patient reports experiencing a sharp pain to affected area([name]). Severity: Patient states wound are getting worse. Duration: Patient has had the wound for > 4 months prior to seeking treatment at the wound center Timing: Pain in wound is constant (hurts all the time) Context: The wound occurred when the patient was in hospital with a necrotizing fasciitis of the right gluteal area and a ulceration on the right ankle Modifying Factors: Other treatment([name]) tried include:admitted to the hospital for IV antibiotics and a full workup and has  also had a recent angiogram Associated Signs and Symptoms: Patient reports having increase discharge. 51 year old patient was sent to [place] from [facility] where she was seen by Dr. Sherril Cong for a left ankle ulceration and was recently hospitalized with hypotension and sepsis. She was treated with IV antibiotics and has been scheduled to see Dr. Sharol Given. She was seen by vascular surgery who recommended a femoral bypass but surgery has been delayed until her sacral wound from last year'[name] necrotizing fasciitis has healed. Was seeing the wound care team at [facility] but wanted to change over. She is a smoker and smokes a pack of cigarettes a day The patient was recently admitted in [place] between February 2 and February 14. She had a follow- up to see vascular surgery, orthopedic surgery and infectious disease. During her admission she was known to have peripheral arterial disease, diabetes mellitus with neuropathy, chronic pain, open wound with necrotizing fasciitis of the sacral area which had been there since November 2017 Past medical history significant for diabetes mellitus, ankle ulcer, sacral ulcer, necrotizing fasciitis, arterial occlusive disease, tobacco  abuse. Review of the electronic medical records reveals that Dr. 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 An MRI showed edema in the bone which was consistent with osteomyelitis and the chronic ulcer and may need surgical intervention. She also had a sacral decubitus ulcer which was treated with the wound VAC. On 01/07/2017 she was taken up by the vascular surgeon Dr. Trula Slade for an abdominal aortogram and bilateral lower extremity runoff, for a history of having bilateral femoropopliteal bypass graft as well as external iliac stenting on the right and stenting of her bypass graft. She had developed a nonhealing wound on her right ankle and there was a possibility of a femoral occlusion. the findings were noted and the [name], DRUCKENMILLER. ([id number]) impression was a surgical revascularization with a aorto bifemoral bypass graft. Both the femoropopliteal bypass grafts were patent. 01/31/2017 -- x-ray of the right hip and pelvis -- IMPRESSION:No radiographic evidence of acute osteomyelitis. Normal-appearing right hip joint space for age. No acute bony abnormality of the hip. 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 Since seeing her last week she has not had an appointment with infectious disease or the orthopedic specialist yet. 02/06/2017 -- the patient missed a couple of appointments yesterday due to the weather but other than that has apparently given up smoking for the last 5 days. 02/13/2017 -- she has rescheduled her infectious disease appointment and also the appointment with the orthopedic surgeon Dr. Sharol Given 03/06/2017 -- she was seen by Dr. Lucianne Lei dam of infectious disease on 03/05/2017 and he recommendedIV antibiotics but the patient did not want to have that and he has given her Augmentin and doxycycline and will  reevaluate her in 2 months time. Objective Constitutional Pulse regular. Respirations normal and unlabored. Afebrile. Vitals Time Taken: 8:58 AM, Height: 67 in, Weight: 137 lbs, BMI: 21.5, Temperature: 98.1 F, Pulse: 90 bpm, Respiratory Rate: 16 breaths/min, Blood Pressure: 77/45 mmHg. Eyes Nonicteric. Reactive to light. Ears, Nose, Mouth, and Throat Lips, teeth, and gums WNL.Marland Kitchen Moist mucosa without lesions. Neck supple and nontender. No palpable supraclavicular or cervical adenopathy. Normal sized without goiter. Respiratory WNL. No retractions.. Breath sounds WNL, No rubs, rales, rhonchi, or wheeze.. Cardiovascular Heart rhythm and rate  regular, no murmur or gallop.. Pedal Pulses WNL. No clubbing, cyanosis or edema. Chest Breasts symmetical and no nipple discharge.. Breast tissue WNL, no masses, lumps, or tenderness.. Lymphatic Driver, Trachelle [name]. ([id number]) No adneopathy. No adenopathy. No adenopathy. Musculoskeletal Adexa without tenderness or enlargement.. Digits and nails w/o clubbing, cyanosis, infection, petechiae, ischemia, or inflammatory conditions.Marland Kitchen Psychiatric Judgement and insight Intact.. No evidence of depression, anxiety, or agitation.. General Notes: the right gluteal wound is looking excellent with the superior half of it having no hyper granulation tissue and the inferior half of it is deep but not down to bone. The wound did not need any sharp debridement. The right ankle wound continues to have minimal circumferential undermining but overall has increased granulation tissue and is looking much better. No Sharp debridement was required today. Integumentary (Hair, Skin) No suspicious lesions. No crepitus or fluctuance. No peri-wound warmth or erythema. No masses.. Wound #1 status is Open. Original cause of wound was Gradually Appeared. The wound is located on the Right,Lateral Malleolus. The wound measures 1.7cm length x 2.2cm width x 0.3cm depth; 2.937cm^2  area and 0.881cm^3 volume. There is Fat Layer (Subcutaneous Tissue) Exposed exposed. There is no tunneling noted, however, there is undermining starting at 12:00 and ending at 12:00 with a maximum distance of 1.8cm. There is a medium amount of serosanguineous drainage noted. The wound margin is distinct with the outline attached to the wound base. There is medium (34-66%) pink granulation within the wound bed. There is a medium (34-66%) amount of necrotic tissue within the wound bed including Adherent Slough. The periwound skin appearance exhibited: Induration, Maceration. The periwound skin appearance did not exhibit: Callus, Crepitus, Excoriation, Rash, Scarring, Dry/Scaly, Atrophie Blanche, Cyanosis, Ecchymosis, Hemosiderin Staining, Mottled, Pallor, Rubor, Erythema. Periwound temperature was noted as No Abnormality. The periwound has tenderness on palpation. Wound #2 status is Open. Original cause of wound was Gradually Appeared. The wound is located on the Right Gluteus. The wound measures 6.2cm length x 2cm width x 2.5cm depth; 9.739cm^2 area and 24.347cm^3 volume. There is Fat Layer (Subcutaneous Tissue) Exposed exposed. There is no tunneling or undermining noted. There is a large amount of serosanguineous drainage noted. The wound margin is distinct with the outline attached to the wound base. There is large (67-100%) pink, pale granulation within the wound bed. There is a small (1-33%) amount of necrotic tissue within the wound bed including Adherent Slough. The periwound skin appearance exhibited: Induration, Maceration. The periwound skin appearance did not exhibit: Callus, Crepitus, Excoriation, Rash, Scarring, Dry/Scaly, Atrophie Blanche, Cyanosis, Ecchymosis, Hemosiderin Staining, Mottled, Pallor, Rubor, Erythema. Periwound temperature was noted as No Abnormality. The periwound has tenderness on palpation. Assessment Active Problems ICD-10 E11.621 - Type 2 diabetes mellitus with  foot ulcer [name], [name]. ([id number]) L89.314 - Pressure ulcer of right buttock, stage 4 L97.312 - Non-pressure chronic ulcer of right ankle with fat layer exposed F17.218 - Nicotine dependence, cigarettes, with other nicotine-induced disorders I70.233 - Atherosclerosis of native arteries of right leg with ulceration of ankle M86.371 - Chronic multifocal osteomyelitis, right ankle and foot Plan Wound Cleansing: Wound #1 Right,Lateral Malleolus: Clean wound with Normal Saline. Wound #2 Right Gluteus: Clean wound with Normal Saline. Anesthetic: Wound #1 Right,Lateral Malleolus: Topical Lidocaine 4% cream applied to wound bed prior to debridement Wound #2 Right Gluteus: Topical Lidocaine 4% cream applied to wound bed prior to debridement - in clinic Skin Barriers/Peri-Wound Care: Wound #1 Right,Lateral Malleolus: Skin Prep - in clinic Wound #2 Right Gluteus: Skin Prep Primary Wound  Dressing: Wound #1 Right,Lateral Malleolus: Prisma Ag Wound #2 Right Gluteus: Hydrafera Blue - apply to open granulated area. Prisma Ag - please apply to the base of the wound before the black foam Secondary Dressing: Wound #1 Right,Lateral Malleolus: Dry Gauze Boardered Foam Dressing - in clinic until [facility] applies VAC Wound #2 Right Gluteus: Boardered Foam Dressing - in [facility] [place] only - HHRN to replace NPWT tomorrow Dressing Change Frequency: Wound #1 Right,Lateral Malleolus: Change dressing every day. Wound #2 Right Gluteus: Change Dressing Monday, Wednesday, Friday Follow-up Appointments: Wound #1 Right,Lateral Malleolus: Return Appointment in 1 week. Wound #2 Right Gluteus: Return Appointment in 1 week. [name], [name] ([id number]) Additional Orders / Instructions: Wound #1 Right,Lateral Malleolus: Stop Smoking Increase protein intake. Activity as tolerated Other: - Please add vitamin A, vitamin C and zinc supplements to your diet Wound #2 Right Gluteus: Stop Smoking Increase protein  intake. Activity as tolerated Other: - Please add vitamin A, vitamin C and zinc supplements to your diet Home Health: Wound #1 Right,Lateral Malleolus: [place] Visits - [place] Nurse may visit PRN to address patient [name] wound care needs. FACE TO FACE ENCOUNTER: MEDICARE and MEDICAID PATIENTS: I certify that this patient is under my care and that I had a face-to-face encounter that meets the physician face-to-face encounter requirements with this patient on this date. The encounter with the patient was in whole or in part for the following MEDICAL CONDITION: (primary reason for [place]) MEDICAL NECESSITY: I certify, that based on my findings, NURSING services are a medically necessary home health service. HOME BOUND STATUS: I certify that my clinical findings support that this patient is homebound (i.e., Due to illness or injury, pt requires aid of supportive devices such as crutches, cane, wheelchairs, walkers, the use of special transportation or the assistance of another person to leave their place of residence. There is a normal inability to leave the home and doing so requires considerable and taxing effort. Other absences are for medical reasons / religious services and are infrequent or of short duration when for other reasons). If current dressing causes regression in wound condition, may D/C ordered dressing product/[name] and apply Normal Saline Moist Dressing daily until next [place] / Other MD appointment. [place] of regression in wound condition at [phone]. Please direct any NON-WOUND related issues/requests for orders to patient'[name] Primary Care Physician Wound #2 Right Gluteus: [place] Nurse may visit PRN to address patient [name] wound care needs. FACE TO FACE ENCOUNTER: MEDICARE and MEDICAID PATIENTS: I certify that this patient is under my care and  that I had a face-to-face encounter that meets the physician face-to-face encounter requirements with this patient on this date. The encounter with the patient was in whole or in part for the following MEDICAL CONDITION: (primary reason for [place]) MEDICAL NECESSITY: I certify, that based on my findings, NURSING services are a medically necessary home health service. HOME BOUND STATUS: I certify that my clinical findings support that this patient is homebound (i.e., Due to illness or injury, pt requires aid of supportive devices such as crutches, cane, wheelchairs, walkers, the use of special transportation or the assistance of another person to leave their place of residence. There is a normal inability to leave the home and doing so requires considerable and taxing effort. Other absences are for medical reasons / religious services and are infrequent or of short duration when  for other reasons). If current dressing causes regression in wound condition, may D/C ordered dressing product/[name] and apply Normal Saline Moist Dressing daily until next [place] / Other MD appointment. [place] of regression in wound condition at [phone]. Please direct any NON-WOUND related issues/requests for orders to patient'[name] Primary Care Physician Negative Pressure Wound Therapy: Wound #2 Right Gluteus: Wound VAC settings at 125/130 mmHg continuous pressure. Use BLACK/GREEN foam to wound cavity. Use WHITE foam to fill any tunnel/[name] and/or undermining. Change VAC dressing 3 X WEEK. Change canister as indicated when full. Nurse may titrate settings and frequency of dressing changes as clinically [name], [name]. ([id number]) indicated. - black foam to wound cavity only - use Hydrafera blue to granulated tissue Medications-please add to medication list.: Wound #1 Right,Lateral Malleolus: Santyl Enzymatic Ointment The patient tells me she is going to have a PICC line soon but  from Dr. Lucianne Lei dam'[name] note he has put her on Augmentin and doxycycline for the next 2 months. After review I have recommended: 1. Silver collagen to be applied to the right ankle, change daily 2. Hydrofera Blue to the superficial wound and then black foam (silver collagen below this) for the wound VAC, to the right gluteal region to be changed 3 times a week. 3. Follow-up with Dr. Sharol Given regarding the right fibula bone biopsy. appointment pending 4. Follow up with infectious disease regarding long-term antibiotics. appointment just completed 5. need to completely give up smoking -- I have commended on being off cigarettes for the last 4 weeks 6. adequate protein, vitamin A, vitamin C and zinc She has had all questions answered and will be compliant Electronic Signature([name]) Signed: 03/06/2017 10:00:08 AM By: Christin Fudge MD, FACS Entered By: Christin Fudge on 03/06/2017 10:00:08 [place], [name] ([id number]) -------------------------------------------------------------------------------- SuperBill Details Patient Name: [name]. Date of Service: 03/06/2017 Medical Record Number: [id number] Patient Account Number: [id number] Date of Birth/Sex: 1966/11/05 (51 y.o. 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 F17.218 Nicotine dependence, cigarettes, with other nicotine-induced disorders I70.233 Atherosclerosis of native arteries of right leg with ulceration of ankle M86.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, right ankle and foot Facility Procedures CPT4 Code: 08676195 Description: 99213 - WOUND CARE VISIT-LEV 3 EST  PT Modifier: Quantity: 1 Physician Procedures CPT4 Code Description: 0932671 24580 - WC PHYS LEVEL 3 - EST PT ICD-10 Description Diagnosis E11.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

## 2017-03-10 ENCOUNTER — Other Ambulatory Visit (HOSPITAL_COMMUNITY): Payer: Medicare Other

## 2017-03-10 ENCOUNTER — Encounter (HOSPITAL_COMMUNITY): Payer: Medicare Other

## 2017-03-10 ENCOUNTER — Ambulatory Visit (INDEPENDENT_AMBULATORY_CARE_PROVIDER_SITE_OTHER): Payer: Medicare Other | Admitting: Orthopedic Surgery

## 2017-03-10 ENCOUNTER — Inpatient Hospital Stay (INDEPENDENT_AMBULATORY_CARE_PROVIDER_SITE_OTHER)
Admission: RE | Admit: 2017-03-10 | Discharge: 2017-03-10 | Disposition: A | Discharge status: Home / Self Care | Payer: Medicare Other | Source: Ambulatory Visit

## 2017-03-10 ENCOUNTER — Ambulatory Visit (INDEPENDENT_AMBULATORY_CARE_PROVIDER_SITE_OTHER): Payer: Medicare Other | Admitting: Surgery

## 2017-03-10 ENCOUNTER — Encounter: Payer: Self-pay | Admitting: Surgery

## 2017-03-10 VITALS — BP 62/44 | HR 93 | Temp 97.0°F | Resp 16 | Ht 67.0 in | Wt 142.0 lb

## 2017-03-10 DIAGNOSIS — I739 Peripheral vascular disease, unspecified: Secondary | ICD-10-CM

## 2017-03-10 DIAGNOSIS — L97311 Non-pressure chronic ulcer of right ankle limited to breakdown of skin: Secondary | ICD-10-CM

## 2017-03-10 DIAGNOSIS — I7025 Atherosclerosis of native arteries of other extremities with ulceration: Secondary | ICD-10-CM | POA: Diagnosis not present

## 2017-03-10 DIAGNOSIS — Z01810 Encounter for preprocedural cardiovascular examination: Secondary | ICD-10-CM

## 2017-03-10 NOTE — Progress Notes (Signed)
Office Visit Note   Patient: [name]           Date of Birth: November 13, 1966           MRN: [id number] Visit Date: 03/10/2017              Requested by: [name], MD [place], [place] [address] PCP: [name], MD  No chief complaint on file.     HPI: Patient is a 51 year old woman with severe peripheral vascular disease she has a sacral decubitus ulcer currently been treated by the wound center with a wound VAC in place. Patient has a chronic ulcer over the lateral malleolus secondary to severe peripheral vascular disease. She has seen Dr. Trula Slade this morning and he is recommending a femoropopliteal bypass but does not want to proceed until patient's sacral decubitus ulcer has healed. Patient states she is currently using Prisma for wound care dressing as having advanced home care do her dressing changes at home.  Patient states that she has stopped smoking husband is still smoking.  Assessment & Plan: Visit Diagnoses:  1. Non-pressure chronic ulcer of right ankle limited to breakdown of skin ([place])     Plan: Recommend she continue with the Prisma wound care dressing changes following the office in 2 months which should coincide with her follow-up with Dr. Trula Slade.  Plan for 3 view radiographs of the right ankle at follow-up. I'm concerned patient may be developing chronic osteomyelitis of the distal fibula.  Follow-Up Instructions: Return in about 2 months (around 05/10/2017).   Ortho Exam  Patient is alert, oriented, no adenopathy, well-dressed, normal affect, normal respiratory effort. Patient does have a faintly palpable dorsalis pedis pulse her foot is cold she has capillary refill greater than 3 seconds. She has a chronic ulcer over the lateral distal fibula right ankle. There is good beefy granulation tissue over the bone there is no exposed bone the ulcer is 2 cm in diameter and about 3 mm deep. There is no purulence no ascending cellulitis.  Iodosorb dressing was a Band-Aid was applied.  Imaging: No results found.  Labs: Lab Results  Component Value Date   HGBA1C 10.8 (H) 12/31/2016   HGBA1C 11.2 (H) 12/24/2016   HGBA1C 13.1 (H) 06/27/2011   ESRSEDRATE 18 03/05/2017   ESRSEDRATE 65 (H) 12/31/2016   ESRSEDRATE 78 (H) 12/24/2016   CRP 7.5 03/05/2017   CRP 1.0 (H) 12/31/2016   CRP 5.4 (H) 12/24/2016   REPTSTATUS 12/25/2016 FINAL 12/23/2016   GRAMSTAIN  09/30/2016    ABUNDANT WBC PRESENT,BOTH PMN AND MONONUCLEAR ABUNDANT GRAM NEGATIVE RODS ABUNDANT GRAM POSITIVE COCCI IN PAIRS IN CLUSTERS FEW GRAM POSITIVE RODS    CULT MULTIPLE SPECIES PRESENT, SUGGEST RECOLLECTION (A) 12/23/2016    Orders:  No orders of the defined types were placed in this encounter.  No orders of the defined types were placed in this encounter.    Procedures: No procedures performed  Clinical Data: No additional findings.  ROS:  All other systems negative, except as noted in the HPI. Review of Systems  Objective: Vital Signs: There were no vitals taken for this visit.  Specialty Comments:  No specialty comments available.  PMFS History: Patient Active Problem List   Diagnosis Date Noted  . Osteomyelitis of right fibula ([place]) 03/05/2017  . Non-pressure chronic ulcer of right ankle limited to breakdown of skin ([place]) 01/17/2017  . Elevated lactic acid level   . Osteomyelitis of ankle ([place]) 12/29/2016  .  Chronic pain 12/24/2016  . Buttock wound, left, subsequent encounter   . Hypotension 12/23/2016  . IDDM (insulin dependent diabetes mellitus) ([place]) 12/23/2016  . AKI (acute kidney injury) ([place]) 12/23/2016  . Malnutrition ([place]) 12/23/2016  . Thrush 12/23/2016  . Sepsis ([place]) 11/27/2016  . DM2 (diabetes mellitus, type 2) ([place]) 11/27/2016  . Chronic back pain 11/27/2016  . Orthostatic hypotension 11/27/2016  . Demand ischemia ([place])   . Other hyperlipidemia   . Right sided weakness   . Retinal tumor of right eye   . Pressure  injury of skin 10/01/2016  . Necrotizing fasciitis ([place]) 09/30/2016  . 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 Paresthesia of both hands 03/05/2016  . DDD (degenerative disc disease), lumbosacral 03/26/2015  . DDD (degenerative disc disease), cervical 03/26/2015  . Sacroiliac joint disease 03/26/2015  . Occipital neuralgia 03/26/2015  . Pain in limb-Bilateral Leg 04/25/2014  . PAD (peripheral artery disease) ([place]) 04/05/2013  . 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 Arthritis   . Asthma   . Chronic back pain   . Depression   . Diabetes mellitus   . Diabetic neuropathy ([place])   . Family history of adverse reaction to anesthesia    mother had difficlty waking   . GERD (gastroesophageal reflux disease)   . Hyperlipidemia   . Joint pain   . Leg pain    With Walking  . Osteomyelitis of right fibula ([place]) 03/05/2017  . PAD (peripheral artery disease) ([place])   . Reflux   . Ulcer    Foot    Family History  Problem Relation Age of Onset  . Coronary artery disease Mother   . Peripheral vascular disease Mother   . Heart disease Mother     Before age 29  . Other Mother     Venous insuffiency  . Diabetes Mother   . Hyperlipidemia Mother   . Hypertension Mother   . Varicose Veins Mother   . Heart attack Mother     before age 31  . Heart disease Father   . Diabetes Father   . Diabetes Maternal Grandmother   . Diabetes Paternal Grandmother   . Diabetes Paternal Grandfather   . Diabetes Sister   . Hypertension Sister   . Diabetes Brother   . Hypertension Brother     Past Surgical History:  Procedure Laterality Date  . ABDOMINAL AORTAGRAM  June 15, 2014  . ABDOMINAL AORTAGRAM N/A 06/15/2014   Procedure: ABDOMINAL Maxcine Ham;  Surgeon: Serafina Mitchell, MD;  Location[facility] CATH LAB;  Service: Cardiovascular;  Laterality: N/A;  . ABDOMINAL AORTAGRAM N/A 11/22/2014   Procedure: ABDOMINAL AORTAGRAM;   Surgeon: Serafina Mitchell, MD;  Location: [facility] CATH LAB;  Service: Cardiovascular;  Laterality: N/A;  . ABDOMINAL AORTOGRAM W/LOWER EXTREMITY N/A 01/07/2017   Procedure: Abdominal Aortogram w/Lower Extremity;  Surgeon: Serafina Mitchell, MD;  Location: [facility] CV LAB;  Service: Cardiovascular;  Laterality: N/A;  . ARTERIAL BYPASS SURGRY  07/05/2010   Right Common Femoral to below knee popliteal BPG  . BACK SURGERY     X's  2  . CARDIAC CATHETERIZATION    . CHOLECYSTECTOMY     Gall Bladder  . CYSTECTOMY Right    foot  . CYSTECTOMY Left    wrist  . INTERCOSTAL NERVE BLOCK  November 2015  . IRRIGATION AND DEBRIDEMENT BUTTOCKS Right 09/30/2016   Procedure: DEBRIDEMENT RIGHT  BUTTOCK WOUND;  Surgeon: Georganna Skeans, MD;  Location: [facility];  Service: General;  Laterality: Right;  . left foot surgery    . left wrist cyst removal Left   . PERIPHERAL VASCULAR CATHETERIZATION N/A 05/07/2016   Procedure: Abdominal Aortogram;  Surgeon: Serafina Mitchell, MD;  Location: [place] CV LAB;  Service: Cardiovascular;  Laterality: N/A;  . PERIPHERAL VASCULAR CATHETERIZATION N/A 05/07/2016   Procedure: Lower Extremity Angiography;  Surgeon: Serafina Mitchell, MD;  Location: [place] CV LAB;  Service: Cardiovascular;  Laterality: N/A;  . PERIPHERAL VASCULAR CATHETERIZATION N/A 05/07/2016   Procedure: Aortic Arch Angiography;  Surgeon: Serafina Mitchell, MD;  Location: [place] CV LAB;  Service: Cardiovascular;  Laterality: N/A;  . PERIPHERAL VASCULAR CATHETERIZATION N/A 05/07/2016   Procedure: Upper Extremity Angiography;  Surgeon: Serafina Mitchell, MD;  Location: [place] CV LAB;  Service: Cardiovascular;  Laterality: N/A;  . PERIPHERAL VASCULAR CATHETERIZATION Right 05/07/2016   Procedure: Peripheral Vascular Balloon Angioplasty;  Surgeon: Serafina Mitchell, MD;  Location: [facility] CV LAB;  Service: Cardiovascular;  Laterality: Right;  subclavian  . PERIPHERAL VASCULAR CATHETERIZATION Right 05/07/2016    Procedure: Peripheral Vascular Intervention;  Surgeon: Serafina Mitchell, MD;  Location: [facility] CV LAB;  Service: Cardiovascular;  Laterality: Right;  External  Iliac  . SKIN GRAFT Right 2012   [name] by Dr. Nils Pyle- Right and Left Ankle  . SPINE SURGERY    . TONSILLECTOMY     Social History   Occupational History  . disabled    Social History Main Topics  . Smoking status: Former Smoker    Years: 30.00    Types: Cigarettes    Quit date: 02/07/2017  . Smokeless tobacco: Never Used  . Alcohol use No  . Drug use: No  . Sexual activity: Yes    Birth control/ protection: Post-menopausal

## 2017-03-10 NOTE — Progress Notes (Signed)
[facility] [place]  Patient name: GUDELIA EUGENE MRN: [id number] DOB: 1966-07-31 Sex: female   REASON FOR VISIT:    Follow up   HISOTRY OF PRESENT ILLNESS:   [name] is a 51 y.o. female who presents for follow-up.  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  She has had multiple interventions for recurrent stenosis.  She has also undergone balloon angioplasty of her right subclavian artery for upper extremity numbness.  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  She has known osteomyelitis.  She is on prolonged antibiotics and has a wound VAC in place.  While in the hospital in February 2018 she had a wound on her right foot.  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  She is not a candidate for percutaneous intervention.  She needs surgical revascularization but because of her infectious issues this has been delayed.  Today she tells me that her wound on her buttocks is healing.  She is recommended to get a PICC line for long-term IV antibiotics.  She is also scheduled to get a stress test tomorrow  PAST MEDICAL HISTORY:   Past Medical History:  Diagnosis Date  . Arthritis   . Asthma   . Chronic back pain   . Depression   . Diabetes mellitus   . Diabetic neuropathy ([place])   . Family history of adverse reaction to anesthesia    mother had difficlty waking   . GERD (gastroesophageal reflux disease)   . Hyperlipidemia   . Joint pain   . Leg pain    With Walking  . Osteomyelitis of right fibula ([place]) 03/05/2017  . PAD (peripheral artery disease) ([place])   . Reflux   . Ulcer    Foot     FAMILY HISTORY:   Family History  Problem Relation Age of Onset  . Coronary artery disease Mother   . Peripheral vascular disease Mother   . Heart disease Mother     Before age 68  . Other Mother     Venous insuffiency    . Diabetes Mother   . Hyperlipidemia Mother   . Hypertension Mother   . Varicose Veins Mother   . Heart attack Mother     before age 62  . Heart disease Father   . Diabetes Father   . Diabetes Maternal Grandmother   . Diabetes Paternal Grandmother   . Diabetes Paternal Grandfather   . Diabetes Sister   . Hypertension Sister   . Diabetes Brother   . Hypertension Brother     SOCIAL HISTORY:   Social History  Substance Use Topics  . Smoking status: Former Smoker    Years: 30.00    Types: Cigarettes    Quit date: 02/07/2017  . Smokeless tobacco: Never Used  . Alcohol use No     ALLERGIES:   Allergies  Allergen Reactions  . Penicillins Other (See Comments)    Severe Headache (not so sure about this now) Has patient had a PCN reaction causing immediate rash, facial/tongue/throat swelling, SOB or lightheadedness with hypotension: No Has patient had a PCN reaction causing severe rash involving mucus membranes or skin necrosis: No Has patient had a PCN reaction that required hospitalization: No Has patient had a PCN reaction occurring within the last 10 years: No If all of the above answers are "NO", then may proceed with Cephalosporin use.  CURRENT MEDICATIONS:   Current Outpatient Prescriptions  Medication Sig Dispense Refill  . albuterol (PROVENTIL) 2 MG tablet Take 2 mg by mouth 3 (three) times daily as needed for shortness of breath.     Marland Kitchen amoxicillin-clavulanate (AUGMENTIN) 875-125 MG tablet Take 1 tablet by mouth 2 (two) times daily. 60 tablet 5  . aspirin 81 MG tablet Take 81 mg by mouth daily.     Marland Kitchen atorvastatin (LIPITOR) 40 MG tablet Take 1 tablet (40 mg total) by mouth daily. 90 tablet 3  . cetirizine (ZYRTEC) 10 MG tablet Take 10 mg by mouth daily as needed for allergies. Reported on 03/27/2016    . cyclobenzaprine (FLEXERIL) 10 MG tablet Limit 1 tablet by mouth 1  to  3 times per day if tolerated (Patient taking differently: Take 10 mg by mouth 3 (three)  times daily as needed for muscle spasms. ) 90 tablet 0  . dicyclomine (BENTYL) 10 MG capsule Take 10 mg by mouth 4 (four) times daily as needed for spasms.    Marland Kitchen doxycycline (VIBRA-TABS) 100 MG tablet Take 1 tablet (100 mg total) by mouth 2 (two) times daily. 60 tablet 5  . furosemide (LASIX) 40 MG tablet Take 40 mg by mouth 2 (two) times daily as needed for fluid.     Marland Kitchen HUMALOG KWIKPEN 100 UNIT/ML KiwkPen Inject 10 Units into the skin 3 (three) times daily. Per sliding scale    . HYDROcodone-acetaminophen (NORCO) 10-325 MG tablet Limit 1 tablet by mouth per day or twice per day if tolerated (Patient taking differently: Take 1 tablet by mouth See admin instructions. Every 8-12 hours) 60 tablet 0  . LANTUS SOLOSTAR 100 UNIT/ML Solostar Pen Inject 36 Units into the skin 2 (two) times daily.     . meclizine (ANTIVERT) 25 MG tablet Take 25 mg by mouth 3 (three) times daily as needed for dizziness.    . midodrine (PROAMATINE) 10 MG tablet Take 1 tablet (10 mg total) by mouth 3 (three) times daily with meals. 90 tablet 0  . omeprazole (PRILOSEC) 40 MG capsule Take 40 mg by mouth daily.    . pregabalin (LYRICA) 100 MG capsule Limit 1 tab by mouth twice a day to 3 times a day if tolerated (Patient taking differently: Take 100 mg by mouth 3 (three) times daily. ) 90 capsule 0  . pseudoephedrine-guaifenesin (MUCINEX D) 60-600 MG per tablet Take 1 tablet by mouth every 12 (twelve) hours as needed for congestion. Reported on 04/08/2016     No current facility-administered medications for this visit.    Facility-Administered Medications Ordered in Other Visits  Medication Dose Route Frequency Provider Last Rate Last Dose  . 0.9 %  sodium chloride infusion   Intravenous Continuous Serafina Mitchell, MD        REVIEW OF SYSTEMS:   [X]  denotes positive finding, [ ]  denotes negative finding Cardiac  Comments:  Chest pain or chest pressure:    Shortness of breath upon exertion:    Short of breath when lying  flat:    Irregular heart rhythm:        Vascular    Pain in calf, thigh, or hip brought on by ambulation:    Pain in feet at night that wakes you up from your sleep:     Blood clot in your veins:    Leg swelling:         Pulmonary    Oxygen at home:    Productive cough:  Wheezing:         Neurologic    Sudden weakness in arms or legs:     Sudden numbness in arms or legs:     Sudden onset of difficulty speaking or slurred speech:    Temporary loss of vision in one eye:     Problems with dizziness:         Gastrointestinal    Blood in stool:     Vomited blood:         Genitourinary    Burning when urinating:     Blood in urine:        Psychiatric    Major depression:         Hematologic    Bleeding problems:    Problems with blood clotting too easily:        Skin    Rashes or ulcers: x       Constitutional    Fever or chills:      PHYSICAL EXAM:   Vitals:   03/10/17 1050  BP: (!) 62/44  Pulse: 93  Resp: 16  Temp: 97 F (36.1 C)  TempSrc: Oral  SpO2: 99%  Weight: 142 lb (64.4 kg)  Height: 5\' 7"  (1.702 m)    GENERAL: The patient is a well-nourished female, in no acute distress. The vital signs are documented above. CARDIAC: There is a regular rate and rhythm.  PULMONARY: Non-labored respirations MUSCULOSKELETAL: There are no major deformities or cyanosis. NEUROLOGIC: No focal weakness or paresthesias are detected. SKIN: Sacral wound with wound VAC in place.  Dressing applied to the right foot wound. PSYCHIATRIC: The patient has a normal affect.  STUDIES:   none  MEDICAL ISSUES:   I discussed with the patient that this is a very challenging situation given her need for revascularization with a synthetic graft and her ongoing active infectious issues.  I would at least like to wait until her sacral wound has completely closed before considering aortobifemoral bypass graft.  She understands that without revascularization of her iliac occlusion that  she is at risk for occluding her right femoral-popliteal bypass graft.  If this happens, she will likely have progression of her ulcer disease and BX risk for amputation.  However if we proceed with revascularization and ended up with her aortic graft getting infected this could be life threatening situation.  For the above reasons, I have elected to have her follow-up with me in 2 months to check on the status of her sacral wound.  I will make further recommendations based on how she is doing at that time.  She is scheduled to get her cardiac stress test tomorrow.    [name], MD [facility] [phone] Pager [phone]

## 2017-03-12 ENCOUNTER — Encounter
Admission: RE | Admit: 2017-03-12 | Discharge: 2017-03-12 | Disposition: A | Discharge status: Home / Self Care | Payer: Medicare Other | Source: Ambulatory Visit | Attending: Cardiology | Admitting: Cardiology

## 2017-03-12 DIAGNOSIS — Z01810 Encounter for preprocedural cardiovascular examination: Secondary | ICD-10-CM

## 2017-03-12 DIAGNOSIS — I739 Peripheral vascular disease, unspecified: Secondary | ICD-10-CM | POA: Insufficient documentation

## 2017-03-12 LAB — NM MYOCAR MULTI W/SPECT W/WALL MOTION / EF
CHL CUP STRESS STAGE 1 GRADE: 0 %
CHL CUP STRESS STAGE 1 HR: 93 {beats}/min
CHL CUP STRESS STAGE 1 SPEED: 0 mph
CHL CUP STRESS STAGE 2 GRADE: 0 %
CHL CUP STRESS STAGE 2 HR: 93 {beats}/min
CHL CUP STRESS STAGE 4 GRADE: 0 %
CHL CUP STRESS STAGE 4 HR: 99 {beats}/min
CHL CUP STRESS STAGE 4 SPEED: 0 mph
CSEPPMHR: 57 %
Estimated workload: 1 METS
LV dias vol: 92 mL (ref 46–106)
LV sys vol: 42 mL
Peak HR: 96 {beats}/min
Percent HR: 58 %
Rest HR: 93 {beats}/min
SDS: 1
SRS: 17
SSS: 11
Stage 2 Speed: 0 mph
Stage 3 Grade: 0 %
Stage 3 HR: 96 {beats}/min
Stage 3 Speed: 0 mph
TID: 1.06

## 2017-03-12 MED ORDER — REGADENOSON 0.4 MG/5ML IV SOLN
0.4000 mg | Freq: Once | INTRAVENOUS | Status: AC
  Administered 2017-03-12: 0.4 mg via INTRAVENOUS
  Filled 2017-03-12: qty 5

## 2017-03-12 MED ORDER — TECHNETIUM TC 99M TETROFOSMIN IV KIT
28.8200 | PACK | Freq: Once | INTRAVENOUS | Status: AC | PRN
  Administered 2017-03-12: 28.82 via INTRAVENOUS

## 2017-03-12 MED ORDER — TECHNETIUM TC 99M TETROFOSMIN IV KIT
13.0000 | PACK | Freq: Once | INTRAVENOUS | Status: AC | PRN
  Administered 2017-03-12: 12.86 via INTRAVENOUS

## 2017-03-13 ENCOUNTER — Encounter: Payer: Medicare Other | Admitting: Surgery

## 2017-03-13 DIAGNOSIS — E11621 Type 2 diabetes mellitus with foot ulcer: Secondary | ICD-10-CM | POA: Diagnosis not present

## 2017-03-14 ENCOUNTER — Telehealth: Payer: Self-pay | Admitting: *Deleted

## 2017-03-14 DIAGNOSIS — I248 Other forms of acute ischemic heart disease: Secondary | ICD-10-CM

## 2017-03-14 NOTE — Progress Notes (Signed)
[name], [name] ([id number]) Visit Report for 03/13/2017 Chief Complaint Document Details Patient Name: [name], [name]. Date of Service: 03/13/2017 9:15 AM Medical Record Number: [id number] Patient Account Number: [id number] Date of Birth/Sex: 10-30-1966 (51 y.o. Female) Treating RN: Afful, RN, [name], Velva Harman Primary Care Provider: Delight Stare Other Clinician: Referring Provider: Delight Stare Treating Provider/Extender: Frann Rider in Treatment: 7 Information Obtained from: Patient Chief Complaint Patients presents for treatment of an open diabetic ulcer with an arterial etiology on her right lateral ankle and right gluteal area for about 4 months Electronic Signature(s) Signed: 03/13/2017 10:01:33 AM By: Christin Fudge MD, FACS Entered By: Christin Fudge on 03/13/2017 10:01:32 Vannest, [name] ([id number]) -------------------------------------------------------------------------------- HPI Details Patient Name: [name]. Date of Service: 03/13/2017 9:15 AM Medical Record Number: [id number] Patient Account Number: [id number] Date of Birth/Sex: 03/09/1966 (51 y.o. Female) Treating RN: Afful, RN, [name], Velva Harman Primary Care Provider: Delight Stare Other Clinician: Referring Provider: Delight Stare Treating Provider/Extender: Frann Rider in Treatment: 7 History of Present Illness Location: right lateral ankle and right gluteal area Quality: Patient reports experiencing a sharp pain to affected area(s). Severity: Patient states wound are getting worse. Duration: Patient has had the wound for > 4 months prior to seeking treatment at the wound center Timing: Pain in wound is constant (hurts all the time) Context: The wound occurred when the patient was in hospital with a necrotizing fasciitis of the right gluteal area and a ulceration on the right ankle Modifying Factors: Other treatment(s) tried include:admitted to the hospital for IV antibiotics and a full workup and has also  had a recent angiogram Associated Signs and Symptoms: Patient reports having increase discharge. HPI Description: 51 year old patient was sent to [place] from [facility] where she was seen by Dr. Sherril Cong for a left ankle ulceration and was recently hospitalized with hypotension and sepsis. She was treated with IV antibiotics and has been scheduled to see Dr. Sharol Given. She was seen by vascular surgery who recommended a femoral bypass but surgery has been delayed until her sacral wound from last year's necrotizing fasciitis has healed. Was seeing the wound care team at [facility] but wanted to change over. She is a smoker and smokes a pack of cigarettes a day The patient was recently admitted in [place] between February 2 and February 14. She had a follow- up to see vascular surgery, orthopedic surgery and infectious disease. 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 Review of the electronic medical records reveals that Dr. 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 An MRI showed edema in the bone which was consistent with osteomyelitis and the chronic ulcer and may need surgical intervention. She also had a sacral decubitus ulcer which was treated with the wound VAC. On 01/07/2017 she was taken up by the vascular surgeon Dr. Trula Slade for an abdominal aortogram and bilateral lower extremity runoff, for a history of having bilateral femoropopliteal bypass graft as well as external iliac stenting on the right and stenting of her bypass graft. She had developed a  nonhealing wound on her right ankle and there was a possibility of a femoral  occlusion. the findings were noted and the impression was a surgical revascularization with a aorto bifemoral bypass graft. Both the femoropopliteal bypass grafts were patent. 01/31/2017 -- x-ray of the right hip and pelvis -- IMPRESSION:No radiographic evidence of acute osteomyelitis. Normal-appearing right hip joint space for age. No acute bony abnormality of the hip. 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 Since seeing her last week she has not had an appointment with infectious disease or the orthopedic specialist yet. EEVA, SCHLOSSER ([id number]) 02/06/2017 -- the patient missed a couple of appointments yesterday due to the weather but other than that has apparently given up smoking for the last 5 days. 02/13/2017 -- she has rescheduled her infectious disease appointment and also the appointment with the orthopedic surgeon Dr. Sharol Given 03/06/2017 -- she was seen by Dr. Lucianne Lei dam of infectious disease on 03/05/2017 and he recommendedIV antibiotics but the patient did not want to have that and he has given her Augmentin and doxycycline and will reevaluate her in 2 months time. 03/13/2017 -- he saw Dr. Trula Slade regarding her vascular issues and he would like to wait to the sacral wound is completely closed before considering aortobifemoral bypass graft. He will see her back in 2 months time. She was also seen by Dr. Sharol Given of orthopedics who recommended continue wound care dressings and follow in the office in about 2 months time. he also recommended 3 view radiographs of the right ankle at follow-up. Electronic Signature(s) Signed: 03/13/2017 10:06:07 AM By: Christin Fudge MD, FACS Entered By: Christin Fudge on 03/13/2017 10:06:07 [place], [name] ([id number]) -------------------------------------------------------------------------------- Physical Exam  Details Patient Name: [name], [name] Date of Service: 03/13/2017 9:15 AM Medical Record Number: [id number] Patient Account Number: [id number] Date of Birth/Sex: 20-Aug-1966 (51 y.o. Female) Treating RN: Baruch Gouty, RN, [name], [employer] Primary Care Provider: Delight Stare Other Clinician: Referring Provider: Delight Stare Treating Provider/Extender: Frann Rider in Treatment: 7 Constitutional . Pulse regular. Respirations normal and unlabored. Afebrile. . Eyes Nonicteric. Reactive to light. Ears, Nose, Mouth, and Throat Lips, teeth, and gums WNL.Marland Kitchen Moist mucosa without lesions. Neck supple and nontender. No palpable supraclavicular or cervical adenopathy. Normal sized without goiter. Respiratory WNL. No retractions.. Breath sounds WNL, No rubs, rales, rhonchi, or wheeze.. Cardiovascular Heart rhythm and rate regular, no murmur or gallop.. Pedal Pulses WNL. No clubbing, cyanosis or edema. Chest Breasts symmetical and no nipple discharge.. Breast tissue WNL, no masses, lumps, or tenderness.. Gastrointestinal (GI) Abdomen without masses or tenderness.. No liver or spleen enlargement or tenderness.. Genitourinary (GU) No hydrocele, spermatocele, tenderness of the cord, or testicular mass.Marland Kitchen Penis without lesions.Lowella Fairy without lesions. No cystocele, or rectocele. Pelvic support intact, no discharge.Marland Kitchen Urethra without masses, tenderness or scarring.Marland Kitchen Lymphatic No adneopathy. No adenopathy. No adenopathy. Musculoskeletal Adexa without tenderness or enlargement.. Digits and nails w/o clubbing, cyanosis, infection, petechiae, ischemia, or inflammatory conditions.. Integumentary (Hair, Skin) No suspicious lesions. No crepitus or fluctuance. No peri-wound warmth or erythema. No masses.Marland Kitchen Psychiatric Judgement and insight Intact.. No evidence of depression, anxiety, or agitation.. Notes the right gluteal wound is looking excellent with the superior half of it having no hyper granulation tissue  and the inferior half of it is deep but not down to bone. The wound did not need any sharp debridement. The right ankle wound continues to have minimal circumferential undermining but overall has increased granulation tissue and is looking much better. No Sharp debridement was required today. Mccorvey, Naryah S. (  [id number]) Electronic Signature(s) Signed: 03/13/2017 10:06:35 AM By: Christin Fudge MD, FACS Entered By: Christin Fudge on 03/13/2017 10:06:33 Gross, [name] ([id number]) -------------------------------------------------------------------------------- Physician Orders Details Patient Name: [name], [name]. Date of Service: 03/13/2017 9:15 AM Medical Record Number: [id number] Patient Account Number: [id number] Date of Birth/Sex: 16-Jan-1966 (51 y.o. Female) Treating RN: Afful, RN, [name], Velva Harman Primary Care Provider: Delight Stare Other Clinician: Referring Provider: Delight Stare Treating Provider/Extender: Frann Rider in Treatment: 7 Verbal / Phone Orders: No Diagnosis Coding Wound Cleansing Wound #1 Right,Lateral Malleolus o Clean wound with Normal Saline. Wound #2 Right Gluteus o Clean wound with Normal Saline. Anesthetic Wound #1 Right,Lateral Malleolus o Topical Lidocaine 4% cream applied to wound bed prior to debridement Wound #2 Right Gluteus o Topical Lidocaine 4% cream applied to wound bed prior to debridement - in clinic Skin Barriers/Peri-Wound Care Wound #1 Right,Lateral Malleolus o Skin Prep - in clinic Wound #2 Right Gluteus o Skin Prep Primary Wound Dressing Wound #1 Right,Lateral Malleolus o Prisma Ag - please pack into the under mining's. Wound #2 Right Gluteus o Hydrafera Blue - apply to open granulated area. o Prisma Ag - please apply to the base of the wound before the black foam Secondary Dressing Wound #1 Right,Lateral Malleolus o Dry Gauze o Boardered Foam Dressing - in clinic until [facility] applies VAC Wound #2 Right  Gluteus o Boardered Foam Dressing - in [facility] only - HHRN to replace NPWT tomorrow LATIVIA, VELIE ([id number]) Dressing Change Frequency Wound #1 Right,Lateral Malleolus o Change dressing every day. Wound #2 Right Gluteus o Change Dressing Monday, Wednesday, Friday Follow-up Appointments Wound #1 Right,Lateral Malleolus o Return Appointment in 1 week. Wound #2 Right Gluteus o Return Appointment in 1 week. Additional Orders / Instructions Wound #1 Right,Lateral Malleolus o Stop Smoking o Increase protein intake. o Activity as tolerated o Other: - Please add vitamin A, vitamin C and zinc supplements to your diet Wound #2 Right Gluteus o Stop Smoking o Increase protein intake. o Activity as tolerated o Other: - Please add vitamin A, vitamin C and zinc supplements to your diet Home Health Wound #1 [place] Visits - [place] Nurse may visit PRN to address patientos wound care needs. o FACE TO FACE ENCOUNTER: MEDICARE and MEDICAID PATIENTS: I certify that this patient is under my care and that I had a face-to-face encounter that meets the physician face-to-face encounter requirements with this patient on this date. The encounter with the patient was in whole or in part for the following MEDICAL CONDITION: (primary reason for [place]) MEDICAL NECESSITY: I certify, that based on my findings, NURSING services are a medically necessary home health service. HOME BOUND STATUS: I certify that my clinical findings support that this patient is homebound (i.e., Due to illness or injury, pt requires aid of supportive devices such as crutches, cane, wheelchairs, walkers, the use of special transportation or the assistance of another person to leave their place of residence. There is a normal inability to leave the home and doing so requires considerable and taxing effort. Other absences are  for medical reasons / religious services and are infrequent or of short duration when for other reasons). o If current dressing causes regression in wound condition, may D/C ordered dressing product/s and apply Normal Saline Moist Dressing daily until next [place] / Other MD appointment. [place] of regression in wound condition at [phone]. [name], [name]. ([id number]) o  Please direct any NON-WOUND related issues/requests for orders to patient's Primary Care Physician Wound #2 Right [place] Visits - [place] Nurse may visit PRN to address patientos wound care needs. o FACE TO FACE ENCOUNTER: MEDICARE and MEDICAID PATIENTS: I certify that this patient is under my care and that I had a face-to-face encounter that meets the physician face-to-face encounter requirements with this patient on this date. The encounter with the patient was in whole or in part for the following MEDICAL CONDITION: (primary reason for [place]) MEDICAL NECESSITY: I certify, that based on my findings, NURSING services are a medically necessary home health service. HOME BOUND STATUS: I certify that my clinical findings support that this patient is homebound (i.e., Due to illness or injury, pt requires aid of supportive devices such as crutches, cane, wheelchairs, walkers, the use of special transportation or the assistance of another person to leave their place of residence. There is a normal inability to leave the home and doing so requires considerable and taxing effort. Other absences are for medical reasons / religious services and are infrequent or of short duration when for other reasons). o If current dressing causes regression in wound condition, may D/C ordered dressing product/s and apply Normal Saline Moist Dressing daily until next [place] / Other MD appointment. [place] of  regression in wound condition at [phone]. o Please direct any NON-WOUND related issues/requests for orders to patient's Primary Care Physician Negative Pressure Wound Therapy Wound #2 Right Gluteus o Wound VAC settings at 125/130 mmHg continuous pressure. Use BLACK/GREEN foam to wound cavity. Use WHITE foam to fill any tunnel/s and/or undermining. Change VAC dressing 3 X WEEK. Change canister as indicated when full. Nurse may titrate settings and frequency of dressing changes as clinically indicated. - black foam to wound cavity only - use Hydrafera blue to granulated tissue Electronic Signature(s) Signed: 03/13/2017 4:50:03 PM By: Christin Fudge MD, FACS Signed: 03/13/2017 5:08:13 PM By: Regan Lemming [name], RN Entered By: Regan Lemming on 03/13/2017 09:36:30 [name], [name] ([id number]) -------------------------------------------------------------------------------- Problem List Details Patient Name: [name], [name]. Date of Service: 03/13/2017 9:15 AM Medical Record Number: [id number] Patient Account Number: [id number] Date of Birth/Sex: 03-06-1966 (51 y.o. Female) Treating RN: Afful, RN, [name], Velva Harman Primary Care Provider: Delight Stare Other Clinician: Referring Provider: Delight Stare Treating Provider/Extender: Frann Rider in Treatment: 7 Active Problems ICD-10 Encounter Code Description Active Date Diagnosis E11.621 Type 2 diabetes mellitus with foot ulcer 01/23/2017 Yes L89.314 Pressure ulcer of right buttock, stage 4 01/23/2017 Yes L97.312 Non-pressure chronic ulcer of right ankle with fat layer 01/23/2017 Yes exposed F17.218 Nicotine dependence, cigarettes, with other nicotine- 01/23/2017 Yes induced disorders I70.233 Atherosclerosis of native arteries of right leg with 01/23/2017 Yes ulceration of ankle M86.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 Date of Service: 03/13/2017 9:15 AM Medical Record Number: [id number] Patient Account Number: [id number] Date of Birth/Sex: [birth year] (51 y.o. Female) Treating RN: Afful, RN, [name], Velva Harman Primary Care Provider: Delight Stare Other Clinician: Referring Provider: Delight Stare Treating Provider/Extender: Frann Rider in Treatment: 7 Subjective Chief Complaint Information obtained from Patient Patients presents for treatment of an open diabetic ulcer with an arterial etiology on her right lateral ankle and  right gluteal area for about 4 months History of Present Illness (HPI) The following HPI elements were documented for the patient's wound: Location: right lateral ankle and right gluteal area Quality: Patient reports experiencing a sharp pain to affected area(s). Severity: Patient states wound are getting worse. Duration: Patient has had the wound for > 4 months prior to seeking treatment at the wound center Timing: Pain in wound is constant (hurts all the time) Context: The wound occurred when the patient was in hospital with a necrotizing fasciitis of the right gluteal area and a ulceration on the right ankle Modifying Factors: Other treatment(s) tried include:admitted to the hospital for IV antibiotics and a full workup and has also had a recent angiogram Associated Signs and Symptoms: Patient reports having increase discharge. 51 year old patient was sent to [place] from [facility] where she was seen by Dr. Sherril Cong for a left ankle ulceration and was recently hospitalized with hypotension and sepsis. She was treated with IV antibiotics and has been scheduled to see Dr. Sharol Given. She was seen by vascular surgery who recommended a femoral  bypass but surgery has been delayed until her sacral wound from last year's necrotizing fasciitis has healed. Was seeing the wound care team at [facility] but wanted to change over. She is a smoker and smokes a pack of cigarettes a day The patient was recently admitted in [place] between February 2 and February 14. She had a follow- up to see vascular surgery, orthopedic surgery and infectious disease. 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 Review of the electronic medical records reveals that Dr. 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 An MRI showed edema in the bone which was consistent with osteomyelitis and the chronic ulcer and may need surgical intervention. She also had a sacral decubitus ulcer which was treated with the wound VAC. On 01/07/2017 she was taken up by the vascular surgeon Dr. Trula Slade for an abdominal aortogram and bilateral lower extremity runoff, for a history of having bilateral femoropopliteal bypass graft as well as external iliac stenting on the right and stenting of her bypass graft. She had developed a nonhealing wound on her right ankle and there was a possibility of a femoral occlusion. the findings were noted and the SHAUNA, [name]. ([id number]) impression was a surgical revascularization with a aorto bifemoral bypass graft. Both the femoropopliteal bypass grafts were patent. 01/31/2017 -- x-ray of the right hip and pelvis -- IMPRESSION:No radiographic evidence of acute osteomyelitis. Normal-appearing right hip joint space for age.  No acute bony abnormality of the hip. 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 Since seeing her last week she has not had an appointment with infectious disease or the orthopedic specialist yet. 02/06/2017 -- the patient missed a couple of appointments yesterday due to the weather but other than that has apparently given up smoking for the last 5 days. 02/13/2017 -- she has rescheduled her infectious disease appointment and also the appointment with the orthopedic surgeon Dr. Sharol Given 03/06/2017 -- she was seen by Dr. Lucianne Lei dam of infectious  disease on 03/05/2017 and he recommendedIV antibiotics but the patient did not want to have that and he has given her Augmentin and doxycycline and will reevaluate her in 2 months time. 03/13/2017 -- he saw Dr. Trula Slade regarding her vascular issues and he would like to wait to the sacral wound is completely closed before considering aortobifemoral bypass graft. He will see her back in 2 months time. She was also seen by Dr. Sharol Given of orthopedics who recommended continue wound care dressings and follow in the office in about 2 months time. he also recommended 3 view radiographs of the right ankle at follow-up. Objective Constitutional Pulse regular. Respirations normal and unlabored. Afebrile. Vitals Time Taken: 9:08 AM, Height: 67 in, Weight: 137 lbs, BMI: 21.5, Temperature: 98.1 F, Pulse: 98 bpm, Respiratory Rate: 16 breaths/min, Blood Pressure: 104/70 mmHg. Eyes Nonicteric. Reactive to light. Ears, Nose, Mouth, and Throat Lips, teeth, and gums WNL.Marland Kitchen Moist mucosa without lesions. Neck supple and nontender. No palpable supraclavicular or cervical adenopathy. Normal sized without goiter. Respiratory WNL. No retractions.. Breath sounds WNL, No rubs, rales, rhonchi, or wheeze.. Cardiovascular Spiegelman, Kacey S. ([id number]) Heart rhythm and rate regular, no murmur or gallop.. Pedal Pulses WNL. No  clubbing, cyanosis or edema. Chest Breasts symmetical and no nipple discharge.. Breast tissue WNL, no masses, lumps, or tenderness.. Gastrointestinal (GI) Abdomen without masses or tenderness.. No liver or spleen enlargement or tenderness.. Genitourinary (GU) No hydrocele, spermatocele, tenderness of the cord, or testicular mass.Marland Kitchen Penis without lesions.Lowella Fairy without lesions. No cystocele, or rectocele. Pelvic support intact, no discharge.Marland Kitchen Urethra without masses, tenderness or scarring.Marland Kitchen Lymphatic No adneopathy. No adenopathy. No adenopathy. Musculoskeletal Adexa without tenderness or enlargement.. Digits and nails w/o clubbing, cyanosis, infection, petechiae, ischemia, or inflammatory conditions.Marland Kitchen Psychiatric Judgement and insight Intact.. No evidence of depression, anxiety, or agitation.. General Notes: the right gluteal wound is looking excellent with the superior half of it having no hyper granulation tissue and the inferior half of it is deep but not down to bone. The wound did not need any sharp debridement. The right ankle wound continues to have minimal circumferential undermining but overall has increased granulation tissue and is looking much better. No Sharp debridement was required today. Integumentary (Hair, Skin) No suspicious lesions. No crepitus or fluctuance. No peri-wound warmth or erythema. No masses.. Wound #1 status is Open. Original cause of wound was Gradually Appeared. The wound is located on the Right,Lateral Malleolus. The wound measures 1.5cm length x 2cm width x 0.3cm depth; 2.356cm^2 area and 0.707cm^3 volume. There is Fat Layer (Subcutaneous Tissue) Exposed exposed. There is no tunneling noted, however, there is undermining starting at 12:00 and ending at 12:00 with a maximum distance of 1cm. There is a medium amount of serosanguineous drainage noted. The wound margin is distinct with the outline attached to the wound base. There is medium (34-66%) pink  granulation within the wound bed. There is a small (1-33%) amount of necrotic tissue within the wound bed including Adherent Slough. The periwound skin appearance exhibited: Induration, Maceration. The periwound skin appearance did not exhibit: Callus, Crepitus, Excoriation, Rash, Scarring, Dry/Scaly, Atrophie Blanche, Cyanosis, Ecchymosis, Hemosiderin Staining, Mottled, Pallor, Rubor, Erythema. Periwound temperature was noted as No Abnormality. The periwound has tenderness on palpation. Wound #2 status is Open. Original cause of wound was Gradually Appeared. The wound is located on the Right Gluteus. The wound measures 6cm length x 2cm width x 2.5cm depth; 9.425cm^2 area and 23.562cm^3 volume. There is Fat Layer (Subcutaneous Tissue) Exposed exposed. There is no  tunneling or undermining noted. There is a large amount of serosanguineous drainage noted. The wound margin is distinct with the outline attached to the wound base. There is large (67-100%) pink, pale granulation within the wound bed. There is a small (1-33%) amount of necrotic tissue within the wound bed including Adherent Slough. The periwound skin appearance exhibited: Induration, Maceration. The periwound skin appearance [name], [name] ([id number]) did not exhibit: Callus, Crepitus, Excoriation, Rash, Scarring, Dry/Scaly, Atrophie Blanche, Cyanosis, Ecchymosis, Hemosiderin Staining, Mottled, Pallor, Rubor, Erythema. Periwound temperature was noted as No Abnormality. The periwound has tenderness on palpation. 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 Wound #2 Right Gluteus: Clean wound with Normal Saline. Anesthetic: Wound #1 Right,Lateral Malleolus: Topical Lidocaine 4% cream applied to wound bed prior to debridement Wound #2 Right Gluteus: Topical Lidocaine 4% cream applied to wound bed prior to debridement - in clinic Skin Barriers/Peri-Wound Care: Wound #1 Right,Lateral Malleolus: Skin Prep - in clinic Wound #2 Right Gluteus: Skin Prep Primary Wound Dressing: Wound #1 Right,Lateral Malleolus: Prisma Ag - please pack into the under mining's. Wound #2 Right Gluteus: Hydrafera Blue - apply to open granulated area. Prisma Ag - please apply to the base of the wound before the black foam Secondary Dressing: Wound #1 Right,Lateral Malleolus: Dry Gauze [name], [name] ([id number]) Boardered Foam Dressing - in clinic until [place] #2 Right Gluteus: Boardered Foam Dressing - in [facility] [place] only - HHRN to replace NPWT tomorrow Dressing Change Frequency: Wound #1 Right,Lateral Malleolus: Change dressing every day. Wound #2 Right Gluteus: Change Dressing Monday, Wednesday, Friday Follow-up Appointments: Wound #1 Right,Lateral Malleolus: Return Appointment in 1 week. Wound #2 Right Gluteus: Return Appointment in 1 week. Additional Orders / Instructions: Wound #1 Right,Lateral Malleolus: Stop Smoking Increase protein intake. Activity as tolerated Other: - Please add vitamin A, vitamin C and zinc supplements to your diet Wound #2 Right Gluteus: Stop Smoking Increase protein intake. Activity as tolerated Other: - Please add vitamin A, vitamin C and zinc supplements to your diet Home Health: Wound #1 Right,Lateral Malleolus: [place] Visits - [place] Nurse may visit PRN to address patient s wound care needs. FACE TO FACE ENCOUNTER: MEDICARE and MEDICAID PATIENTS: I certify  that this patient is under my care and that I had a face-to-face encounter that meets the physician face-to-face encounter requirements with this patient on this date. The encounter with the patient was in whole or in part for the following MEDICAL CONDITION: (primary reason for [place]) MEDICAL NECESSITY: I certify, that based on my findings, NURSING services are a medically necessary home health service. HOME BOUND STATUS: I certify that my clinical findings support that this patient is homebound (i.e., Due to illness or injury, pt requires aid of supportive devices such as crutches, cane, wheelchairs, walkers, the use of special transportation or the assistance of another person to leave their place of residence. There is a normal inability to leave the home and doing so requires considerable and taxing effort. Other absences are for medical reasons / religious services and are  infrequent or of short duration when for other reasons). If current dressing causes regression in wound condition, may D/C ordered dressing product/s and apply Normal Saline Moist Dressing daily until next [place] / Other MD appointment. [place] of regression in wound condition at [phone]. Please direct any NON-WOUND related issues/requests for orders to patient's Primary Care Physician Wound #2 Right Gluteus: [place] Nurse may visit PRN to address patient s wound care needs. FACE TO FACE ENCOUNTER: MEDICARE and MEDICAID PATIENTS: I certify that this patient is under my care and that I had a face-to-face encounter that meets the physician face-to-face encounter requirements with this patient on this date. The encounter with the patient was in whole or in part for the following MEDICAL CONDITION: (primary reason for [place]) MEDICAL NECESSITY: I certify, that based on my findings, NURSING services are a medically  necessary home health service. HOME BOUND STATUS: I certify that my clinical findings support that this patient is homebound (i.e., Due to [name], HUGHLEY. ([id number]) illness or injury, pt requires aid of supportive devices such as crutches, cane, wheelchairs, walkers, the use of special transportation or the assistance of another person to leave their place of residence. There is a normal inability to leave the home and doing so requires considerable and taxing effort. Other absences are for medical reasons / religious services and are infrequent or of short duration when for other reasons). If current dressing causes regression in wound condition, may D/C ordered dressing product/s and apply Normal Saline Moist Dressing daily until next [place] / Other MD appointment. [place] of regression in wound condition at [phone]. Please direct any NON-WOUND related issues/requests for orders to patient's Primary Care Physician Negative Pressure Wound Therapy: Wound #2 Right Gluteus: Wound VAC settings at 125/130 mmHg continuous pressure. Use BLACK/GREEN foam to wound cavity. Use WHITE foam to fill any tunnel/s and/or undermining. Change VAC dressing 3 X WEEK. Change canister as indicated when full. Nurse may titrate settings and frequency of dressing changes as clinically indicated. - black foam to wound cavity only - use Hydrafera blue to granulated tissue The patient tells me she is going to have a PICC line soon but from Dr. Lucianne Lei dam's note he has put her on Augmentin and doxycycline for the next 2 months. also noted the vascular surgeon Dr. Stephens Shire note and also the orthopedic surgeon Dr. Jess Barters note. At this stage no surgical intervention or biopsy of the fibula has been discussed After review I have recommended: 1. Silver collagen to be applied to the right ankle, change daily 2. Hydrofera Blue to the superficial wound and then black foam (silver collagen  below this) for the wound VAC, to the right gluteal region to be changed 3 times a week. 3. need to completely give up smoking -- I have commended on being off cigarettes for the last 4 weeks 4. adequate protein, vitamin A, vitamin C and zinc 5. at this stage I do not believe she is a candidate for hyperbaric oxygen therapy and follow-up x-rays and possible MRI will be planned appropriately. Last MRI done on 12/24/2016 was consistent with osteomyelitis of the distal 5 cm of fibula She has had all questions answered and will be compliant Electronic Signature(s) Signed: 03/13/2017 10:09:52 AM By: Christin Fudge MD, FACS Previous Signature: 03/13/2017 10:09:43 AM Version By: Christin Fudge MD, FACS Entered By: Christin Fudge on 03/13/2017 10:09:52 [place], Garielle  Chauncey Cruel ([id number]) -------------------------------------------------------------------------------- SuperBill Details Patient Name: [name], DANIS. Date of Service: 03/13/2017 Medical Record Number: [id number] Patient Account Number: [id number] Date of Birth/Sex: August 30, 1966 (51 y.o. 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 M86.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, right ankle and foot Facility Procedures CPT4 Code: [id number] Description: 99213 - WOUND CARE VISIT-LEV 3 EST PT Modifier: Quantity: 1 Physician Procedures CPT4 Code Description: [id number] - WC PHYS LEVEL 3 - EST PT ICD-10 Description Diagnosis E11.621  Type 2 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, stage 4 L97.312 Non-pressure chronic ulcer of right ankle with fat l  M86.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, right ankle and fo Modifier: ayer exposed ot Quantity: 1 Electronic Signature(s) Signed: 03/13/2017 10:10:08 AM By: Christin Fudge MD, FACS Entered By: Christin Fudge on 03/13/2017 10:10:08

## 2017-03-14 NOTE — Telephone Encounter (Signed)
Spoke with patients spouse per release form and reviewed results and recommendations. He verbalized understanding and requested that we call home number [phone] to schedule the echocardiogram. Let him know that I would have someone call to schedule and he verbalized understanding with no further questions at this time.

## 2017-03-14 NOTE — Telephone Encounter (Signed)
Patient called in to review results and recommendations. She did schedule echocardiogram and let her know that we would call her with those results. Reviewed and she verbalized understanding with no further questions at this time.

## 2017-03-14 NOTE — Progress Notes (Addendum)
Jamie, [name] ([id number]) Visit Report for 03/13/2017 Arrival Information Details Patient Name: [name], [name]. Date of Service: 03/13/2017 9:15 AM Medical Record Number: [id number] Patient Account Number: [id number] Date of Birth/Sex: 23-Apr-1966 (51 y.o. Female) Treating RN: Afful, RN, [name], Velva Harman Primary Care Happy [place]: Delight Stare Other Clinician: Referring Iola Turri: Delight Stare Treating Raynor Calcaterra/Extender: Frann Rider in Treatment: 7 Visit Information History Since Last Visit All ordered tests and consults were completed: No Patient Arrived: Gilford Rile Added or deleted any medications: No Arrival Time: 09:04 Any new allergies or adverse reactions: No Accompanied By: self Had a fall or experienced change in No Transfer Assistance: None activities of daily living that may affect Patient Identification Verified: Yes risk of falls: Secondary Verification Process Completed: Yes Signs or symptoms of abuse/neglect since last No Patient Requires Transmission-Based No visito Precautions: Hospitalized since last visit: No Patient Has Alerts: No Has Dressing in Place as Prescribed: Yes Pain Present Now: Yes Electronic Signature(s) Signed: 03/13/2017 9:04:32 AM By: Regan Lemming [name], RN Entered By: Regan Lemming on 03/13/2017 09:04:32 Gebert, Ascencion Chauncey Cruel ([id number]) -------------------------------------------------------------------------------- Clinic Level of Care Assessment Details Patient Name: [name]. Date of Service: 03/13/2017 9:15 AM Medical Record Number: [id number] Patient Account Number: [id number] Date of Birth/Sex: [birth year] (51 y.o. Female) Treating RN: Afful, RN, [name], [employer] Primary Care Neidra Girvan: Delight Stare Other Clinician: Referring Antonina Deziel: Delight Stare Treating Kein Carlberg/Extender: Frann Rider in Treatment: 7 Clinic Level of Care Assessment Items TOOL 4 Quantity Score []  - Use when only an EandM is performed on FOLLOW-UP visit 0 ASSESSMENTS -  Nursing Assessment / Reassessment X - Reassessment of Co-morbidities (includes updates in patient status) 1 10 X - Reassessment of Adherence to Treatment Plan 1 5 ASSESSMENTS - Wound and Skin Assessment / Reassessment []  - Simple Wound Assessment / Reassessment - one wound 0 X - Complex Wound Assessment / Reassessment - multiple wounds 2 5 []  - Dermatologic / Skin Assessment (not related to wound area) 0 ASSESSMENTS - Focused Assessment []  - Circumferential Edema Measurements - multi extremities 0 []  - Nutritional Assessment / Counseling / Intervention 0 X - Lower Extremity Assessment (monofilament, tuning fork, pulses) 1 5 []  - Peripheral Arterial Disease Assessment (using hand held doppler) 0 ASSESSMENTS - Ostomy and/or Continence Assessment and Care []  - Incontinence Assessment and Management 0 []  - Ostomy Care Assessment and Management (repouching, etc.) 0 PROCESS - Coordination of Care X - Simple Patient / Family Education for ongoing care 1 15 []  - Complex (extensive) Patient / Family Education for ongoing care 0 []  - Staff obtains Programmer, systems, Records, Test Results / Process Orders 0 []  - Staff telephones HHA, Nursing Homes / Clarify orders / etc 0 []  - Routine Transfer to another Facility (non-emergent condition) 0 Luchsinger, Yamari S. ([id number]) []  - Routine Hospital Admission (non-emergent condition) 0 []  - New Admissions / Biomedical engineer / Ordering NPWT, Apligraf, etc. 0 []  - Emergency Hospital Admission (emergent condition) 0 []  - Simple Discharge Coordination 0 []  - Complex (extensive) Discharge Coordination 0 PROCESS - Special Needs []  - Pediatric / Minor Patient Management 0 []  - Isolation Patient Management 0 []  - Hearing / Language / Visual special needs 0 []  - Assessment of Community assistance (transportation, D/C planning, etc.) 0 []  - Additional assistance / Altered mentation 0 []  - Support Surface(s) Assessment (bed, cushion, seat, etc.) 0 INTERVENTIONS -  Wound Cleansing / Measurement []  - Simple Wound Cleansing - one wound 0 X - Complex Wound Cleansing - multiple wounds 2 5 X -  Wound Imaging (photographs - any number of wounds) 1 5 []  - Wound Tracing (instead of photographs) 0 []  - Simple Wound Measurement - one wound 0 X - Complex Wound Measurement - multiple wounds 2 5 INTERVENTIONS - Wound Dressings []  - Small Wound Dressing one or multiple wounds 0 X - Medium Wound Dressing one or multiple wounds 2 15 []  - Large Wound Dressing one or multiple wounds 0 []  - Application of Medications - topical 0 []  - Application of Medications - injection 0 INTERVENTIONS - Miscellaneous []  - External ear exam 0 Breunig, Elnore S. ([id number]) []  - Specimen Collection (cultures, biopsies, blood, body fluids, etc.) 0 []  - Specimen(s) / Culture(s) sent or taken to Lab for analysis 0 []  - Patient Transfer (multiple staff / Harrel Lemon Lift / Similar devices) 0 []  - Simple Staple / Suture removal (25 or less) 0 []  - Complex Staple / Suture removal (26 or more) 0 []  - Hypo / Hyperglycemic Management (close monitor of Blood Glucose) 0 []  - Ankle / Brachial Index (ABI) - do not check if billed separately 0 X - Vital Signs 1 5 Has the patient been seen at the hospital within the last three years: Yes Total Score: 105 Level Of Care: New/Established - Level 3 Electronic Signature(s) Signed: 03/13/2017 5:08:13 PM By: Regan Lemming [name], RN Entered By: Regan Lemming on 03/13/2017 09:37:20 Kretschmer, [name] ([id number]) -------------------------------------------------------------------------------- Encounter Discharge Information Details Patient Name: [name]. Date of Service: 03/13/2017 9:15 AM Medical Record Number: [id number] Patient Account Number: [id number] Date of Birth/Sex: [birth year] (51 y.o. Female) Treating RN: Afful, RN, [name], Velva Harman Primary Care Nazarene Bunning: Delight Stare Other Clinician: Referring Roniel Halloran: Delight Stare Treating Ellakate Gonsalves/Extender: Frann Rider in Treatment: 7 Encounter Discharge Information Items Discharge Pain Level: 0 Discharge Condition: Stable Ambulatory Status: Walker Discharge Destination: Home Transportation: Private Auto Accompanied By: self Schedule Follow-up Appointment: No Medication Reconciliation completed No and provided to Patient/Care Marie Chow: Provided on Clinical Summary of Care: 03/13/2017 Form Type Recipient Paper Patient RM Electronic Signature(s) Signed: 03/13/2017 5:04:23 PM By: Regan Lemming [name], RN Previous Signature: 03/13/2017 9:50:49 AM Version By: Ruthine Dose Entered By: Regan Lemming on 03/13/2017 17:04:23 Mark, Cristal Chauncey Cruel ([id number]) -------------------------------------------------------------------------------- Lower Extremity Assessment Details Patient Name: [name]. Date of Service: 03/13/2017 9:15 AM Medical Record Number: [id number] Patient Account Number: [id number] Date of Birth/Sex: 26-Apr-1966 (51 y.o. Female) Treating RN: Afful, RN, [name], [employer] Primary Care Waller Marcussen: Delight Stare Other Clinician: Referring Israa Caban: Delight Stare Treating Sabastian Raimondi/Extender: Frann Rider in Treatment: 7 Edema Assessment Assessed: [Left: No] [Right: No] Edema: [Left: N] [Right: o] Vascular Assessment Claudication: Claudication Assessment [Right:None] Pulses: Dorsalis Pedis Palpable: [Right:Yes] Posterior Tibial Extremity colors, hair growth, and conditions: Extremity Color: [Right:Normal] Hair Growth on Extremity: [Right:No] Temperature of Extremity: [Right:Warm] Capillary Refill: [Right:< 3 seconds] Toe Nail Assessment Left: Right: Thick: Yes Discolored: Yes Deformed: No Improper Length and Hygiene: Yes Electronic Signature(s) Signed: 03/13/2017 5:08:13 PM By: Regan Lemming [name], RN Entered By: Regan Lemming on 03/13/2017 09:10:38 Lamm, [name] ([id number]) -------------------------------------------------------------------------------- Multi Wound Chart  Details Patient Name: [name]. Date of Service: 03/13/2017 9:15 AM Medical Record Number: [id number] Patient Account Number: [id number] Date of Birth/Sex: 26-Sep-1966 (51 y.o. Female) Treating RN: Baruch Gouty, RN, [name], Velva Harman Primary Care Viviene Thurston: Delight Stare Other Clinician: Referring Dhani Dannemiller: Delight Stare Treating Amnah Breuer/Extender: Frann Rider in Treatment: 7 Vital Signs Height(in): 67 Pulse(bpm): 98 Weight(lbs): 137 Blood Pressure 104/70 (mmHg): Body Mass Index(BMI): 21 Temperature(F): 98.1 Respiratory Rate 16 (breaths/min): Photos: [1:No Photos] [  2:No Photos] [N/A:N/A] Wound Location: [1:Right Malleolus - Lateral] [2:Right Gluteus] [N/A:N/A] Wounding Event: [1:Gradually Appeared] [2:Gradually Appeared] [N/A:N/A] Primary Etiology: [1:Diabetic Wound/Ulcer of the Lower Extremity] [2:Open Surgical Wound] [N/A:N/A] Comorbid History: [1:Anemia, Arrhythmia, Coronary Artery Disease, Hypotension, Peripheral Arterial Disease, Type II Diabetes, Osteoarthritis] [2:Anemia, Arrhythmia, Coronary Artery Disease, Hypotension, Peripheral Arterial Disease, Type II Diabetes,  Osteoarthritis] [N/A:N/A] Date Acquired: [1:09/23/2016] [2:09/18/2016] [N/A:N/A] Weeks of Treatment: [1:7] [2:7] [N/A:N/A] Wound Status: [1:Open] [2:Open] [N/A:N/A] Measurements L x W x D 1.5x2x0.3 [2:6x2x2.5] [N/A:N/A] (cm) Area (cm) : [1:2.356] [2:9.425] [N/A:N/A] Volume (cm) : [1:0.707] [2:23.562] [N/A:N/A] % Reduction in Area: [1:40.00%] [2:51.00%] [N/A:N/A] % Reduction in Volume: 64.00% [2:75.50%] [N/A:N/A] Starting Position 1 12 (o'clock): Ending Position 1 [1:12] (o'clock): Maximum Distance 1 1 (cm): Undermining: [1:Yes] [2:No] [N/A:N/A] Classification: [1:Grade 2] [2:Full Thickness Without Exposed Support Structures] [N/A:N/A] Exudate Amount: [1:Medium] [2:Large] [N/A:N/A] Exudate Type: Serosanguineous Serosanguineous N/A Exudate Color: red, brown red, brown N/A Wound Margin: Distinct, outline  attached Distinct, outline attached N/A Granulation Amount: Medium (34-66%) Large (67-100%) N/A Granulation Quality: Pink Pink, Pale N/A Necrotic Amount: Small (1-33%) Small (1-33%) N/A Exposed Structures: Fat Layer (Subcutaneous Fat Layer (Subcutaneous N/A Tissue) Exposed: Yes Tissue) Exposed: Yes Fascia: No Fascia: No Tendon: No Tendon: No Muscle: No Muscle: No Joint: No Joint: No Bone: No Bone: No Epithelialization: None None N/A Periwound Skin Texture: Induration: Yes Induration: Yes N/A Excoriation: No Excoriation: No Callus: No Callus: No Crepitus: No Crepitus: No Rash: No Rash: No Scarring: No Scarring: No Periwound Skin Maceration: Yes Maceration: Yes N/A Moisture: Dry/Scaly: No Dry/Scaly: No Periwound Skin Color: Atrophie Blanche: No Atrophie Blanche: No N/A Cyanosis: No Cyanosis: No Ecchymosis: No Ecchymosis: No Erythema: No Erythema: No Hemosiderin Staining: No Hemosiderin Staining: No Mottled: No Mottled: No Pallor: No Pallor: No Rubor: No Rubor: No Temperature: No Abnormality No Abnormality N/A Tenderness on Yes Yes N/A Palpation: Wound Preparation: Ulcer Cleansing: Ulcer Cleansing: N/A Rinsed/Irrigated with Rinsed/Irrigated with Saline Saline Topical Anesthetic Topical Anesthetic Applied: Other: lidocaine Applied: Other: lidocaine 4% 4% Treatment Notes Electronic Signature(s) Signed: 03/13/2017 10:01:25 AM By: Christin Fudge MD, FACS Entered By: Christin Fudge on 03/13/2017 10:01:25 Picotte, [name] ([id number]) -------------------------------------------------------------------------------- [place] Details Patient Name: TARISA, PAOLA. Date of Service: 03/13/2017 9:15 AM Medical Record Number: [id number] Patient Account Number: [id number] Date of Birth/Sex: 02/23/1966 (51 y.o. Female) Treating RN: Afful, RN, [name], [employer] Primary Care Suhayla Chisom: Delight Stare Other Clinician: Referring Cherina Dhillon: Delight Stare Treating  Mardell Suttles/Extender: Frann Rider in Treatment: 7 Active Inactive ` Orientation to the Wound Care Program Nursing Diagnoses: Knowledge deficit related to the wound healing center program Goals: Patient/caregiver will verbalize understanding of the [place] Program Date Initiated: 01/23/2017 Target Resolution Date: 05/25/2017 Goal Status: Active Interventions: Provide education on orientation to the wound center Notes: ` Peripheral Neuropathy Nursing Diagnoses: Knowledge deficit related to disease process and management of peripheral neurovascular dysfunction Potential alteration in peripheral tissue perfusion (select prior to confirmation of diagnosis) Goals: Patient/caregiver will verbalize understanding of disease process and disease management Date Initiated: 01/23/2017 Target Resolution Date: 05/25/2017 Goal Status: Active Interventions: Assess signs and symptoms of neuropathy upon admission and as needed Provide education on Management of Neuropathy and Related Ulcers Provide education on Management of Neuropathy upon discharge from the [place] Activities: Test ordered outside of clinic : 01/23/2017 Notes: LUISA, LOUK ([id number]) ` Pressure Nursing Diagnoses: Knowledge deficit related to causes and risk factors for pressure ulcer development Knowledge deficit related to management of pressures ulcers Potential for  impaired tissue integrity related to pressure, friction, moisture, and shear Goals: Patient will remain free from development of additional pressure ulcers Date Initiated: 01/23/2017 Target Resolution Date: 05/25/2017 Goal Status: Active Patient will remain free of pressure ulcers Date Initiated: 01/23/2017 Target Resolution Date: 05/25/2017 Goal Status: Active Patient/caregiver will verbalize risk factors for pressure ulcer development Date Initiated: 01/23/2017 Target Resolution Date: 05/25/2017 Goal Status: Active Patient/caregiver  will verbalize understanding of pressure ulcer management Date Initiated: 01/23/2017 Target Resolution Date: 05/25/2017 Goal Status: Active Interventions: Assess: immobility, friction, shearing, incontinence upon admission and as needed Assess offloading mechanisms upon admission and as needed Assess potential for pressure ulcer upon admission and as needed Provide education on pressure ulcers Treatment Activities: Patient referred for pressure reduction/relief devices : 01/23/2017 Patient referred for seating evaluation to ensure proper offloading : 01/23/2017 Pressure reduction/relief device ordered : 01/23/2017 Notes: ` Wound/Skin Impairment Nursing Diagnoses: Impaired tissue integrity Knowledge deficit related to smoking impact on wound healing Knowledge deficit related to ulceration/compromised skin integrity Goals: Patient/caregiver will verbalize understanding of skin care regimen ABIE, KILLIAN ([id number]) Date Initiated: 01/23/2017 Target Resolution Date: 05/25/2017 Goal Status: Active Ulcer/skin breakdown will have a volume reduction of 30% by week 4 Date Initiated: 01/23/2017 Target Resolution Date: 05/25/2017 Goal Status: Active Ulcer/skin breakdown will have a volume reduction of 50% by week 8 Date Initiated: 01/23/2017 Target Resolution Date: 05/25/2017 Goal Status: Active Ulcer/skin breakdown will have a volume reduction of 80% by week 12 Date Initiated: 01/23/2017 Target Resolution Date: 05/25/2017 Goal Status: Active Ulcer/skin breakdown will heal within 14 weeks Date Initiated: 01/23/2017 Target Resolution Date: 05/25/2017 Goal Status: Active Interventions: Assess patient/caregiver ability to obtain necessary supplies Assess patient/caregiver ability to perform ulcer/skin care regimen upon admission and as needed Assess ulceration(s) every visit Provide education on smoking Provide education on ulcer and skin care Treatment Activities: Patient referred to home care :  01/23/2017 Referred to DME Yuki Brunsman for dressing supplies : 01/23/2017 Skin care regimen initiated : 01/23/2017 Topical wound management initiated : 01/23/2017 Notes: Electronic Signature(s) Signed: 03/13/2017 5:08:13 PM By: Regan Lemming [name], RN Entered By: Regan Lemming on 03/13/2017 09:23:25 Saintil, [name] ([id number]) -------------------------------------------------------------------------------- Pain Assessment Details Patient Name: [name]. Date of Service: 03/13/2017 9:15 AM Medical Record Number: [id number] Patient Account Number: [id number] Date of Birth/Sex: November 12, 1966 (51 y.o. Female) Treating RN: Afful, RN, [name], Velva Harman Primary Care Nixxon Faria: Delight Stare Other Clinician: Referring Chen Holzman: Delight Stare Treating Donata Reddick/Extender: Frann Rider in Treatment: 7 Active Problems Location of Pain Severity and Description of Pain Patient Has Paino Yes Site Locations Pain Location: Generalized Pain, Pain in Ulcers With Dressing Change: Yes Rate the pain. Current Pain Level: 3 Character of Pain Describe the Pain: Tender Pain Management and Medication Current Pain Management: Electronic Signature(s) Signed: 03/13/2017 9:04:49 AM By: Regan Lemming [name], RN Entered By: Regan Lemming on 03/13/2017 09:04:49 Wixted, [name] ([id number]) -------------------------------------------------------------------------------- Patient/Caregiver Education Details Patient Name: PAIDYN, MCFERRAN. Date of Service: 03/13/2017 9:15 AM Medical Record Number: [id number] Patient Account Number: [id number] Date of Birth/Gender: [birth year] (51 y.o. Female) Treating RN: Afful, RN, [name], Velva Harman Primary Care Physician: Delight Stare Other Clinician: Referring Physician: Delight Stare Treating Physician/Extender: Frann Rider in Treatment: 7 Education Assessment Education Provided To: Patient Education Topics Provided Peripheral Neuropathy: Methods: Explain/Verbal Responses: State content  correctly Pressure: Methods: Explain/Verbal Responses: State content correctly Smoking and Wound Healing: Methods: Explain/Verbal Responses: State content correctly Welcome To [facility]: Methods: Explain/Verbal Responses: State content correctly Wound/Skin Impairment: Methods: Explain/Verbal Responses:  State content correctly Electronic Signature(s) Signed: 03/13/2017 5:08:13 PM By: Regan Lemming [name], RN Entered By: Regan Lemming on 03/13/2017 17:04:49 Alarie, [name] ([id number]) -------------------------------------------------------------------------------- Wound Assessment Details Patient Name: AKOSUA, CONSTANTINE. Date of Service: 03/13/2017 9:15 AM Medical Record Number: [id number] Patient Account Number: [id number] Date of Birth/Sex: 02-24-1966 (51 y.o. Female) Treating RN: Afful, RN, [name], Velva Harman Primary Care Yoland Scherr: Delight Stare Other Clinician: Referring Aragorn Recker: Delight Stare Treating Scotti Motter/Extender: Frann Rider in Treatment: 7 Wound Status Wound Number: 1 Primary Diabetic Wound/Ulcer of the Lower Etiology: Extremity Wound Location: Right Malleolus - Lateral Wound Open Wounding Event: Gradually Appeared Status: Date Acquired: 09/23/2016 Comorbid Anemia, Arrhythmia, Coronary Artery Weeks Of Treatment: 7 History: Disease, Hypotension, Peripheral Clustered Wound: No Arterial Disease, Type II Diabetes, Osteoarthritis Photos Photo Uploaded By: Regan Lemming on 03/13/2017 15:41:22 Wound Measurements Length: (cm) 1.5 Width: (cm) 2 Depth: (cm) 0.3 Area: (cm) 2.356 Volume: (cm) 0.707 % Reduction in Area: 40% % Reduction in Volume: 64% Epithelialization: None Tunneling: No Undermining: Yes Starting Position (o'clock): 12 Ending Position (o'clock): 12 Maximum Distance: (cm) 1 Wound Description Classification: Grade 2 Foul Odor Aft Wound Margin: Distinct, outline attached Slough/Fibrin Exudate Amount: Medium Exudate Type:  Serosanguineous Exudate Color: red, brown Dubey, Isabele S. ([id number]) er Cleansing: No o No Wound Bed Granulation Amount: Medium (34-66%) Exposed Structure Granulation Quality: Pink Fascia Exposed: No Necrotic Amount: Small (1-33%) Fat Layer (Subcutaneous Tissue) Exposed: Yes Necrotic Quality: Adherent Slough Tendon Exposed: No Muscle Exposed: No Joint Exposed: No Bone Exposed: No Periwound Skin Texture Texture Color No Abnormalities Noted: No No Abnormalities Noted: No Callus: No Atrophie Blanche: No Crepitus: No Cyanosis: No Excoriation: No Ecchymosis: No Induration: Yes Erythema: No Rash: No Hemosiderin Staining: No Scarring: No Mottled: No Pallor: No Moisture Rubor: No No Abnormalities Noted: No Dry / Scaly: No Temperature / Pain Maceration: Yes Temperature: No Abnormality Tenderness on Palpation: Yes Wound Preparation Ulcer Cleansing: Rinsed/Irrigated with Saline Topical Anesthetic Applied: Other: lidocaine 4%, Treatment Notes Wound #1 (Right, Lateral Malleolus) 1. Cleansed with: Clean wound with Normal Saline 4. Dressing Applied: Prisma Ag 5. Secondary Dressing Applied Bordered Foam Dressing Dry Gauze Electronic Signature(s) Signed: 03/13/2017 5:08:13 PM By: Regan Lemming [name], RN Entered By: Regan Lemming on 03/13/2017 09:15:44 Delcarlo, [name] ([id number]) -------------------------------------------------------------------------------- Wound Assessment Details Patient Name: [name]. Date of Service: 03/13/2017 9:15 AM Medical Record Number: [id number] Patient Account Number: [id number] Date of Birth/Sex: 19-Sep-1966 (51 y.o. Female) Treating RN: Afful, RN, [name], [employer] Primary Care Kenitha Glendinning: Delight Stare Other Clinician: Referring Jenny Omdahl: Delight Stare Treating Mattelyn Imhoff/Extender: Frann Rider in Treatment: 7 Wound Status Wound Number: 2 Primary Open Surgical Wound Etiology: Wound Location: Right Gluteus Wound Open Wounding Event:  Gradually Appeared Status: Date Acquired: 09/18/2016 Comorbid Anemia, Arrhythmia, Coronary Artery Weeks Of Treatment: 7 History: Disease, Hypotension, Peripheral Clustered Wound: No Arterial Disease, Type II Diabetes, Osteoarthritis Photos Photo Uploaded By: Regan Lemming on 03/13/2017 15:41:23 Wound Measurements Length: (cm) 6 Width: (cm) 2 Depth: (cm) 2.5 Area: (cm) 9.425 Volume: (cm) 23.562 % Reduction in Area: 51% % Reduction in Volume: 75.5% Epithelialization: None Tunneling: No Undermining: No Wound Description Full Thickness Without Exposed Classification: Support Structures Wound Margin: Distinct, outline attached Exudate Large Amount: Exudate Type: Serosanguineous Exudate Color: red, brown Foul Odor After Cleansing: No Slough/Fibrino Yes Wound Bed Granulation Amount: Large (67-100%) Exposed Structure Artley, Charde S. ([id number]) Granulation Quality: Pink, Pale Fascia Exposed: No Necrotic Amount: Small (1-33%) Fat Layer (Subcutaneous Tissue) Exposed: Yes Necrotic Quality: Adherent Slough Tendon Exposed: No Muscle Exposed:  No Joint Exposed: No Bone Exposed: No Periwound Skin Texture Texture Color No Abnormalities Noted: No No Abnormalities Noted: No Callus: No Atrophie Blanche: No Crepitus: No Cyanosis: No Excoriation: No Ecchymosis: No Induration: Yes Erythema: No Rash: No Hemosiderin Staining: No Scarring: No Mottled: No Pallor: No Moisture Rubor: No No Abnormalities Noted: No Dry / Scaly: No Temperature / Pain Maceration: Yes Temperature: No Abnormality Tenderness on Palpation: Yes Wound Preparation Ulcer Cleansing: Rinsed/Irrigated with Saline Topical Anesthetic Applied: Other: lidocaine 4%, Treatment Notes Wound #2 (Right Gluteus) 1. Cleansed with: Clean wound with Normal Saline 4. Dressing Applied: Saline moistened guaze 5. Secondary Dressing Applied Bordered Foam Dressing Dry Gauze Electronic Signature(s) Signed:  03/13/2017 5:08:13 PM By: Regan Lemming [name], RN Entered By: Regan Lemming on 03/13/2017 09:22:06 Espinal, [name] ([id number]) -------------------------------------------------------------------------------- [place] Details Patient Name: [name]. Date of Service: 03/13/2017 9:15 AM Medical Record Number: [id number] Patient Account Number: [id number] Date of Birth/Sex: 1966-03-10 (51 y.o. Female) Treating RN: Afful, RN, [name], Velva Harman Primary Care Mathhew Buysse: Delight Stare Other Clinician: Referring Halvor Behrend: Delight Stare Treating Merwin Breden/Extender: Frann Rider in Treatment: 7 Vital Signs Time Taken: 09:08 Temperature (F): 98.1 Height (in): 67 Pulse (bpm): 98 Weight (lbs): 137 Respiratory Rate (breaths/min): 16 Body Mass Index (BMI): 21.5 Blood Pressure (mmHg): 104/70 Reference Range: 80 - 120 mg / dl Electronic Signature(s) Signed: 03/13/2017 5:08:13 PM By: Regan Lemming [name], RN Entered By: Regan Lemming on 03/13/2017 09:09:49

## 2017-03-14 NOTE — Telephone Encounter (Signed)
-----   Message from Wende Bushy, MD sent at 03/13/2017  9:24 AM EDT ----- No ischemia. 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

## 2017-03-17 ENCOUNTER — Inpatient Hospital Stay: Payer: Medicare Other | Admitting: Infectious Disease

## 2017-03-17 ENCOUNTER — Telehealth: Payer: Self-pay | Admitting: *Deleted

## 2017-03-17 NOTE — Telephone Encounter (Signed)
The patient stated that she does want the PICC and IV ABX.  She wants to save her foot/leg.  Message routed to Dr. [name].

## 2017-03-17 NOTE — Telephone Encounter (Signed)
So she DOES want PICC now? Before she did not want IV abx. I can put order in and then short stay will need to be arranged for IV antibiotics first doses which will be IV vancomycin and ceftriaxone

## 2017-03-17 NOTE — Telephone Encounter (Signed)
Patient has completed "stress test," She is ready for PICC placement.  Patient requesting a medication for her "nerves" when she goes for the PICC insertion.  She uses the [facility] in [place].  Order for PICC placement needed.  Routing this message to Dr. [name].

## 2017-03-18 ENCOUNTER — Other Ambulatory Visit: Payer: Self-pay | Admitting: Infectious Disease

## 2017-03-18 ENCOUNTER — Telehealth (HOSPITAL_COMMUNITY): Payer: Self-pay

## 2017-03-18 DIAGNOSIS — M869 Osteomyelitis, unspecified: Secondary | ICD-10-CM

## 2017-03-18 NOTE — Telephone Encounter (Signed)
I put note in chart re PICC order whichi s in the other stuff I will have to sign in person at [facility] this afternoon or tomorrow am

## 2017-03-18 NOTE — Telephone Encounter (Signed)
Called to schedule picc line placement, left message for pt to return call. W

## 2017-03-18 NOTE — Progress Notes (Signed)
[name] I put order for PICC in but will need to write handwritten ones for IV abx for home health as well as for short stay I believe  I will def be in clinic tomorrow am and can also swing by later this afternoon to sign those

## 2017-03-20 ENCOUNTER — Other Ambulatory Visit: Payer: Self-pay | Admitting: *Deleted

## 2017-03-20 ENCOUNTER — Ambulatory Visit (HOSPITAL_COMMUNITY)
Admission: RE | Admit: 2017-03-20 | Discharge: 2017-03-20 | Disposition: A | Discharge status: Home / Self Care | Payer: Medicare Other | Source: Ambulatory Visit | Attending: Infectious Disease | Admitting: Infectious Disease

## 2017-03-20 ENCOUNTER — Encounter (HOSPITAL_COMMUNITY): Payer: Self-pay | Admitting: Diagnostic Radiology

## 2017-03-20 ENCOUNTER — Other Ambulatory Visit: Payer: Self-pay | Admitting: Infectious Disease

## 2017-03-20 ENCOUNTER — Ambulatory Visit: Payer: Medicare Other | Admitting: Surgery

## 2017-03-20 ENCOUNTER — Encounter (HOSPITAL_COMMUNITY)
Admission: RE | Admit: 2017-03-20 | Discharge: 2017-03-20 | Disposition: A | Discharge status: Home / Self Care | Payer: Medicare Other | Source: Ambulatory Visit | Attending: Infectious Disease | Admitting: Infectious Disease

## 2017-03-20 DIAGNOSIS — M869 Osteomyelitis, unspecified: Secondary | ICD-10-CM

## 2017-03-20 DIAGNOSIS — M8618 Other acute osteomyelitis, other site: Secondary | ICD-10-CM | POA: Diagnosis present

## 2017-03-20 DIAGNOSIS — F419 Anxiety disorder, unspecified: Secondary | ICD-10-CM

## 2017-03-20 DIAGNOSIS — M86471 Chronic osteomyelitis with draining sinus, right ankle and foot: Secondary | ICD-10-CM | POA: Insufficient documentation

## 2017-03-20 HISTORY — PX: IR US GUIDE VASC ACCESS RIGHT: IMG2390

## 2017-03-20 HISTORY — PX: IR FLUORO GUIDE CV LINE RIGHT: IMG2283

## 2017-03-20 MED ORDER — LIDOCAINE HCL 1 % IJ SOLN
INTRAMUSCULAR | Status: DC | PRN
  Administered 2017-03-20: 10 mL

## 2017-03-20 MED ORDER — HEPARIN SOD (PORK) LOCK FLUSH 100 UNIT/ML IV SOLN
500.0000 [IU] | Freq: Once | INTRAVENOUS | Status: AC
  Administered 2017-03-20: 500 [IU] via INTRAVENOUS

## 2017-03-20 MED ORDER — VANCOMYCIN HCL IN DEXTROSE 1-5 GM/200ML-% IV SOLN
1000.0000 mg | Freq: Once | INTRAVENOUS | Status: AC
  Administered 2017-03-20: 14:00:00 1000 mg via INTRAVENOUS
  Filled 2017-03-20: qty 200

## 2017-03-20 MED ORDER — HEPARIN SOD (PORK) LOCK FLUSH 100 UNIT/ML IV SOLN
INTRAVENOUS | Status: AC
  Administered 2017-03-20: 500 [IU] via INTRAVENOUS
  Filled 2017-03-20: qty 5

## 2017-03-20 MED ORDER — DEXTROSE 5 % IV SOLN
2.0000 g | Freq: Once | INTRAVENOUS | Status: AC
  Administered 2017-03-20: 2 g via INTRAVENOUS
  Filled 2017-03-20: qty 2

## 2017-03-20 MED ORDER — LORAZEPAM 0.5 MG PO TABS: ORAL_TABLET | ORAL | 0 refills | Status: DC

## 2017-03-20 MED ORDER — LIDOCAINE HCL 1 % IJ SOLN
INTRAMUSCULAR | Status: AC
  Filled 2017-03-20: qty 20

## 2017-03-20 NOTE — Telephone Encounter (Signed)
Verbal order per Dr [name] for PICC to be placed 5/3, IV antibiotic orders signed and faxed to short stay for 1st dose, IV antibiotic, lab and PICC care orders faxed to [facility]. Patient requesting oral medication to help with her anxiety about getting PICC.  Verbal order per Dr [name] for 0.5 mg ativan #2 called to [facility] per patient request.  Patient asking for something to help with nausea with her oral antibiotics.  RN reminded patient that she will be off oral antibiotics and asked her to contact us if it persists. Landis Gandy, RN

## 2017-03-20 NOTE — Procedures (Signed)
Placement of right brachial vein PICC.  Tip at SVC/RA junction and ready to use.  Minimal blood loss and no immediate complication.

## 2017-03-20 NOTE — Discharge Instructions (Signed)
PICC Insertion, Care After Refer to this sheet in the next few weeks. These instructions provide you with information on caring for yourself after your procedure. Your health care provider may also give you more specific instructions. Your treatment has been planned according to current medical practices, but problems sometimes occur. Call your health care provider if you have any problems or questions after your procedure. What can I expect after the procedure? After your procedure, it is typical to have the following:  Mild discomfort at the insertion site. This should not last more than a day. Follow these instructions at home:  Rest at home for the remainder of the day after the procedure.  You may bend your arm and move it freely. If your PICC is near or at the bend of your elbow, avoid activity with repeated motion at the elbow.  Avoid lifting heavy objects as instructed by your health care provider.  Avoid using a crutch with the arm on the same side as your PICC. You may need to use a walker. Bandage Care   Keep your PICC bandage (dressing) clean and dry to prevent infection.  Ask your health care provider when you may shower. To keep the dressing dry, cover the PICC with plastic wrap and tape before showering. If the dressing does become wet, replace it right after the shower.  Do not soak in the bath, swim, or use hot tubs when you have a PICC.  Change the PICC dressing as instructed by your health care provider.  Change your PICC dressing if it becomes loose or wet. General PICC Care   Check the PICC insertion site daily for leakage, redness, swelling, or pain.  Flush the PICC as directed by your health care provider. Let your health care provider know right away if the PICC is difficult to flush or does not flush. Do not use force to flush the PICC.  Do not use a syringe that is less than 10 mL to flush the PICC.  Never pull or tug on the PICC.  Avoid blood pressure  checks on the arm with the PICC.  Keep your PICC identification card with you at all times.  Do not take the PICC out yourself. Only a trained health care professional should remove the PICC. Contact a health care provider if:  You have pain in your arm, ear, face, or teeth.  You have fever or chills.  You have drainage from the PICC insertion site.  You have redness or palpate a "cord" around the PICC insertion site.  You cannot flush the catheter. Get help right away if:  You have swelling in the arm in which the PICC is inserted. This information is not intended to replace advice given to you by your health care provider. Make sure you discuss any questions you have with your health care provider. Document Released: 08/25/2013 Document Revised: 04/11/2016 Document Reviewed: 08/27/2013 Elsevier Interactive Patient Education  2017 [place]. Ceftriaxone injection What is this medicine? CEFTRIAXONE (sef try AX one) is a cephalosporin antibiotic. It is used to treat certain kinds of bacterial infections. It will not work for colds, flu, or other viral infections. This medicine may be used for other purposes; ask your health care provider or pharmacist if you have questions. COMMON BRAND NAME(S): Rocephin What should I tell my health care provider before I take this medicine? They need to know if you have any of these conditions: -any chronic illness -bowel disease, like colitis -both kidney and  liver disease -high bilirubin level in newborn patients -an unusual or allergic reaction to ceftriaxone, other cephalosporin or penicillin antibiotics, foods, dyes, or preservatives -pregnant or trying to get pregnant -breast-feeding How should I use this medicine? This medicine is injected into a muscle or infused it into a vein. It is usually given in a medical office or clinic. If you are to give this medicine you will be taught how to inject it. Follow instructions carefully. Use  your doses at regular intervals. Do not take your medicine more often than directed. Do not skip doses or stop your medicine early even if you feel better. Do not stop taking except on your doctor's advice. Talk to your pediatrician regarding the use of this medicine in children. Special care may be needed. Overdosage: If you think you have taken too much of this medicine contact a poison control center or emergency room at once. NOTE: This medicine is only for you. Do not share this medicine with others. What if I miss a dose? If you miss a dose, take it as soon as you can. If it is almost time for your next dose, take only that dose. Do not take double or extra doses. What may interact with this medicine? Do not take this medicine with any of the following medications: -intravenous calcium This medicine may also interact with the following medications: -birth control pills This list may not describe all possible interactions. Give your health care provider a list of all the medicines, herbs, non-prescription drugs, or dietary supplements you use. Also tell them if you smoke, drink alcohol, or use illegal drugs. Some items may interact with your medicine. What should I watch for while using this medicine? Tell your doctor or health care professional if your symptoms do not improve or if they get worse. Do not treat diarrhea with over the counter products. Contact your doctor if you have diarrhea that lasts more than 2 days or if it is severe and watery. If you are being treated for a sexually transmitted disease, avoid sexual contact until you have finished your treatment. Having sex can infect your sexual partner. Calcium may bind to this medicine and cause lung or kidney problems. Avoid calcium products while taking this medicine and for 48 hours after taking the last dose of this medicine. What side effects may I notice from receiving this medicine? Side effects that you should report to your  doctor or health care professional as soon as possible: -allergic reactions like skin rash, itching or hives, swelling of the face, lips, or tongue -breathing problems -fever, chills -irregular heartbeat -pain when passing urine -seizures -stomach pain, cramps -unusual bleeding, bruising -unusually weak or tired Side effects that usually do not require medical attention (report to your doctor or health care professional if they continue or are bothersome): -diarrhea -dizzy, drowsy -headache -nausea, vomiting -pain, swelling, irritation where injected -stomach upset -sweating This list may not describe all possible side effects. Call your doctor for medical advice about side effects. You may report side effects to FDA at 1-800-FDA-1088. Where should I keep my medicine? Keep out of the reach of children. Store at room temperature below 25 degrees C (77 degrees F). Protect from light. Throw away any unused vials after the expiration date. NOTE: This sheet is a summary. It may not cover all possible information. If you have questions about this medicine, talk to your doctor, pharmacist, or health care provider.  2018 Elsevier/Gold Standard (2014-05-23 09:14:54) Vancomycin injection What  is this medicine? VANCOMYCIN Lucianne Lei koe MYE sin) is a glycopeptide antibiotic. It is used to treat certain kinds of bacterial infections. It will not work for colds, flu, or other viral infections. This medicine may be used for other purposes; ask your health care provider or pharmacist if you have questions. COMMON BRAND NAME(S): Vancocin What should I tell my health care provider before I take this medicine? They need to know if you have any of these conditions: -dehydration -hearing loss -kidney disease -other chronic illness -an unusual or allergic reaction to vancomycin, other medicines, foods, dyes, or preservatives -pregnant or trying to get pregnant -breast-feeding How should I use this  medicine? This medicine is infused into a vein. It is usually given by a health care provider in a hospital or clinic. If you receive this medicine at home, you will receive special instructions. Take your medicine at regular intervals. Do not take your medicine more often than directed. Take all of your medicine as directed even if you think you are better. Do not skip doses or stop your medicine early. It is important that you put your used needles and syringes in a special sharps container. Do not put them in a trash can. If you do not have a sharps container, call your pharmacist or healthcare provider to get one. Talk to your pediatrician regarding the use of this medicine in children. While this drug may be prescribed for even very young infants for selected conditions, precautions do apply. Overdosage: If you think you have taken too much of this medicine contact a poison control center or emergency room at once. NOTE: This medicine is only for you. Do not share this medicine with others. What if I miss a dose? If you miss a dose, take it as soon as you can. If it is almost time for your next dose, take only that dose. Do not take double or extra doses. What may interact with this medicine? -amphotericin B -anesthetics -bacitracin -birth control pills -cisplatin -colistin -diuretics -other aminoglycoside antibiotics -polymyxin B This list may not describe all possible interactions. Give your health care provider a list of all the medicines, herbs, non-prescription drugs, or dietary supplements you use. Also tell them if you smoke, drink alcohol, or use illegal drugs. Some items may interact with your medicine. What should I watch for while using this medicine? Tell your doctor or health care professional if your symptoms do not improve or if you get new symptoms. Your condition and lab work will be monitored while you are taking this medicine. Do not treat diarrhea with over the counter  products. Contact your doctor if you have diarrhea that lasts more than 2 days or if it is severe and watery. What side effects may I notice from receiving this medicine? Side effects that you should report to your doctor or health care professional as soon as possible: -allergic reactions like skin rash, itching or hives, swelling of the face, lips, or tongue -breathing difficulty, wheezing -change in amount, color of urine -change in hearing -chest pain -dizziness -fever, chills -flushing of the face and neck (reddening) -low blood pressure -redness, blistering, peeling or loosening of the skin, including inside the mouth -unusual bleeding or bruising -unusually weak or tired Side effects that usually do not require medical attention (report to your doctor or health care professional if they continue or are bothersome): -nausea, vomiting -pain, swelling where injected -stomach cramps This list may not describe all possible side effects. Call your  doctor for medical advice about side effects. You may report side effects to FDA at 1-800-FDA-1088. Where should I keep my medicine? Keep out of the reach of children. You will be instructed on how to store this medicine, if needed. Throw away any unused medicine after the expiration date on the label. NOTE: This sheet is a summary. It may not cover all possible information. If you have questions about this medicine, talk to your doctor, pharmacist, or health care provider.  2018 Elsevier/Gold Standard (2013-06-11 14:46:02)

## 2017-03-21 ENCOUNTER — Encounter (HOSPITAL_COMMUNITY): Payer: Self-pay | Admitting: Diagnostic Radiology

## 2017-03-24 ENCOUNTER — Other Ambulatory Visit (HOSPITAL_COMMUNITY)
Admission: RE | Admit: 2017-03-24 | Discharge: 2017-03-24 | Disposition: A | Discharge status: Home / Self Care | Payer: Medicare Other | Source: Other Acute Inpatient Hospital | Attending: Infectious Disease | Admitting: Infectious Disease

## 2017-03-24 DIAGNOSIS — Z792 Long term (current) use of antibiotics: Secondary | ICD-10-CM | POA: Insufficient documentation

## 2017-03-24 DIAGNOSIS — Z5181 Encounter for therapeutic drug level monitoring: Secondary | ICD-10-CM | POA: Diagnosis present

## 2017-03-24 LAB — CBC WITH DIFFERENTIAL/PLATELET
Basophils Absolute: 0.1 10*3/uL (ref 0.0–0.1)
Basophils Relative: 1 %
EOS ABS: 0.4 10*3/uL (ref 0.0–0.7)
Eosinophils Relative: 4 %
HCT: 37.8 % (ref 36.0–46.0)
HEMOGLOBIN: 12.3 g/dL (ref 12.0–15.0)
LYMPHS ABS: 3.2 10*3/uL (ref 0.7–4.0)
LYMPHS PCT: 33 %
MCH: 27 pg (ref 26.0–34.0)
MCHC: 32.5 g/dL (ref 30.0–36.0)
MCV: 83.1 fL (ref 78.0–100.0)
Monocytes Absolute: 0.5 10*3/uL (ref 0.1–1.0)
Monocytes Relative: 5 %
NEUTROS PCT: 59 %
Neutro Abs: 5.7 10*3/uL (ref 1.7–7.7)
Platelets: 252 10*3/uL (ref 150–400)
RBC: 4.55 MIL/uL (ref 3.87–5.11)
RDW: 15.8 % — ABNORMAL HIGH (ref 11.5–15.5)
WBC: 9.8 10*3/uL (ref 4.0–10.5)

## 2017-03-24 LAB — BASIC METABOLIC PANEL
ANION GAP: 9 (ref 5–15)
BUN: 29 mg/dL — ABNORMAL HIGH (ref 6–20)
CHLORIDE: 100 mmol/L — AB (ref 101–111)
CO2: 27 mmol/L (ref 22–32)
Calcium: 9.2 mg/dL (ref 8.9–10.3)
Creatinine, Ser: 0.99 mg/dL (ref 0.44–1.00)
GFR calc Af Amer: >60 mL/min (ref >60)
GFR calc non Af Amer: >60 mL/min (ref >60)
Glucose, Bld: 170 mg/dL — ABNORMAL HIGH (ref 65–99)
Potassium: 4 mmol/L (ref 3.5–5.1)
SODIUM: 136 mmol/L (ref 135–145)

## 2017-03-24 LAB — VANCOMYCIN, TROUGH: VANCOMYCIN TR: 27 ug/mL — AB (ref 15–20)

## 2017-03-25 NOTE — Progress Notes (Signed)
Amy at [facility] aware and holding vanc/reducing dose.

## 2017-03-26 ENCOUNTER — Other Ambulatory Visit (HOSPITAL_COMMUNITY)
Admission: RE | Admit: 2017-03-26 | Discharge: 2017-03-26 | Disposition: A | Discharge status: Home / Self Care | Payer: Medicare Other | Source: Ambulatory Visit | Attending: Infectious Disease | Admitting: Infectious Disease

## 2017-03-26 DIAGNOSIS — Z792 Long term (current) use of antibiotics: Secondary | ICD-10-CM | POA: Diagnosis present

## 2017-03-26 LAB — CREATININE, SERUM
CREATININE: 1.15 mg/dL — AB (ref 0.44–1.00)
GFR calc Af Amer: >60 mL/min (ref >60)
GFR, EST NON AFRICAN AMERICAN: 54 mL/min — AB (ref >60)

## 2017-03-26 LAB — BUN: BUN: 28 mg/dL — ABNORMAL HIGH (ref 6–20)

## 2017-03-26 LAB — VANCOMYCIN, RANDOM: VANCOMYCIN RM: 22

## 2017-03-28 ENCOUNTER — Encounter: Payer: Medicare Other | Attending: Surgery | Admitting: Surgery

## 2017-03-28 DIAGNOSIS — Z880 Allergy status to penicillin: Secondary | ICD-10-CM | POA: Diagnosis not present

## 2017-03-28 DIAGNOSIS — E11621 Type 2 diabetes mellitus with foot ulcer: Secondary | ICD-10-CM | POA: Diagnosis not present

## 2017-03-28 DIAGNOSIS — L97312 Non-pressure chronic ulcer of right ankle with fat layer exposed: Secondary | ICD-10-CM | POA: Insufficient documentation

## 2017-03-28 DIAGNOSIS — M86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, right ankle and foot: Secondary | ICD-10-CM | POA: Diagnosis not present

## 2017-03-28 DIAGNOSIS — M1990 Unspecified osteoarthritis, unspecified site: Secondary | ICD-10-CM | POA: Insufficient documentation

## 2017-03-28 DIAGNOSIS — L89314 Pressure ulcer of right buttock, stage 4: Secondary | ICD-10-CM | POA: Diagnosis not present

## 2017-03-28 DIAGNOSIS — E1151 Type 2 diabetes mellitus with diabetic peripheral angiopathy without gangrene: Secondary | ICD-10-CM | POA: Insufficient documentation

## 2017-03-28 DIAGNOSIS — F17218 Nicotine dependence, cigarettes, with other nicotine-induced disorders: Secondary | ICD-10-CM | POA: Insufficient documentation

## 2017-03-28 DIAGNOSIS — I70233 Atherosclerosis of native arteries of right leg with ulceration of ankle: Secondary | ICD-10-CM | POA: Diagnosis not present

## 2017-03-28 DIAGNOSIS — D649 Anemia, unspecified: Secondary | ICD-10-CM | POA: Diagnosis not present

## 2017-03-28 DIAGNOSIS — I2510 Atherosclerotic heart disease of native coronary artery without angina pectoris: Secondary | ICD-10-CM | POA: Insufficient documentation

## 2017-03-30 NOTE — Progress Notes (Signed)
[name] ([id number]) Visit Report for 03/28/2017 Chief Complaint Document Details Patient Name: [name], [name]. Date of Service: 03/28/2017 2:00 PM Medical Record Number: [id number] Patient Account Number: [id number] Date of Birth/Sex: Nov 18, 1966 (51 y.o. Female) Treating RN: Afful, RN, [name], Velva Harman Primary Care Provider: Delight Stare Other Clinician: Referring Provider: Delight Stare Treating Provider/Extender: Frann Rider in Treatment: 9 Information Obtained from: Patient Chief Complaint Patients presents for treatment of an open diabetic ulcer with an arterial etiology on her right lateral ankle and right gluteal area for about 4 months Electronic Signature([name]) Signed: 03/28/2017 2:35:03 PM By: Christin Fudge MD, FACS Entered By: Christin Fudge on 03/28/2017 14:35:03 [name], [name] ([id number]) -------------------------------------------------------------------------------- Debridement Details Patient Name: [name]. Date of Service: 03/28/2017 2:00 PM Medical Record Number: [id number] Patient Account Number: [id number] Date of Birth/Sex: 11/29/1965 (51 y.o. Female) Treating RN: Afful, RN, [name], Administrator, sports Primary Care Provider: Delight Stare Other Clinician: Referring Provider: Delight Stare Treating Provider/Extender: Frann Rider in Treatment: 9 Debridement Performed for Wound #1 Right,Lateral Malleolus Assessment: Performed By: Physician Christin Fudge, MD Debridement: Debridement Pre-procedure Yes - 14:28 Verification/Time Out Taken: Start Time: 14:28 Pain Control: Lidocaine 4% Topical Solution Level: Skin/Subcutaneous Tissue Total Area Debrided (L x 1.6 (cm) x 2 (cm) = 3.2 (cm) W): Tissue and other Non-Viable, Exudate, Fat, Fibrin/Slough, Subcutaneous material debrided: Instrument: Curette Bleeding: Minimum Hemostasis Achieved: Pressure End Time: 14:30 Procedural Pain: 0 Post Procedural Pain: 0 Response to Treatment: Procedure was tolerated  well Post Debridement Measurements of Total Wound Length: (cm) 1.6 Width: (cm) 2 Depth: (cm) 0.3 Volume: (cm) 0.754 Character of Wound/Ulcer Post Stable Debridement: Severity of Tissue Post Debridement: Fat layer exposed Post Procedure Diagnosis Same as Pre-procedure Electronic Signature([name]) Signed: 03/28/2017 2:34:54 PM By: Christin Fudge MD, FACS Signed: 03/28/2017 4:40:27 PM By: Regan Lemming [name], RN Entered By: Christin Fudge on 03/28/2017 14:34:54 [name], [name]. ([id number]) [name], [name]. ([id number]) -------------------------------------------------------------------------------- HPI Details Patient Name: [name], [name]. Date of Service: 03/28/2017 2:00 PM Medical Record Number: [id number] Patient Account Number: [id number] Date of Birth/Sex: [birth year] (51 y.o. Female) Treating RN: Afful, RN, [name], Velva Harman Primary Care Provider: Delight Stare Other Clinician: Referring Provider: Delight Stare Treating Provider/Extender: Frann Rider in Treatment: 9 History of Present Illness Location: right lateral ankle and right gluteal area Quality: Patient reports experiencing a sharp pain to affected area([name]). Severity: Patient states wound are getting worse. Duration: Patient has had the wound for > 4 months prior to seeking treatment at the wound center Timing: Pain in wound is constant (hurts all the time) Context: The wound occurred when the patient was in hospital with a necrotizing fasciitis of the right gluteal area and a ulceration on the right ankle Modifying Factors: Other treatment([name]) tried include:admitted to the hospital for IV antibiotics and a full workup and has also had a recent angiogram Associated Signs and Symptoms: Patient reports having increase discharge. HPI Description: 51 year old patient was sent to [place] from [facility] where she was seen by Dr. Sherril Cong for a left ankle ulceration and was recently hospitalized with hypotension and  sepsis. She was treated with IV antibiotics and has been scheduled to see Dr. Sharol Given. She was seen by vascular surgery who recommended a femoral bypass but surgery has been delayed until her sacral wound from last year'[name] necrotizing fasciitis has healed. Was seeing the wound care team at [facility] but wanted to change over. She is a smoker and smokes a pack of cigarettes a day The patient was  recently admitted in [place] between February 2 and February 14. She had a follow- up to see vascular surgery, orthopedic surgery and infectious disease. 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 Review of the electronic medical records reveals that Dr. 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 An MRI showed edema in the bone which was consistent with osteomyelitis and the chronic ulcer and may need surgical intervention. She also had a sacral decubitus ulcer which was treated with the wound VAC. On 01/07/2017 she was taken up by the vascular surgeon Dr. Trula Slade for an abdominal aortogram and bilateral lower extremity runoff, for a history of having bilateral femoropopliteal bypass graft as well as external iliac stenting on the right and stenting of her bypass graft. She had developed a nonhealing wound on her right ankle and there was a possibility of a femoral occlusion. the findings were noted and the impression was a surgical revascularization with a aorto bifemoral bypass graft. Both the femoropopliteal bypass grafts were patent. 01/31/2017 -- x-ray of the right hip and  pelvis -- IMPRESSION:No radiographic evidence of acute osteomyelitis. Normal-appearing right hip joint space for age. No acute bony abnormality of the hip. 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 Since seeing her last week she has not had an appointment with infectious disease or the orthopedic specialist yet. [name], [name] ([id number]) 02/06/2017 -- the patient missed a couple of appointments yesterday due to the weather but other than that has apparently given up smoking for the last 5 days. 02/13/2017 -- she has rescheduled her infectious disease appointment and also the appointment with the orthopedic surgeon Dr. Sharol Given 03/06/2017 -- she was seen by Dr. Lucianne Lei dam of infectious disease on 03/05/2017 and he recommendedIV antibiotics but the patient did not want to have that and he has given her Augmentin and doxycycline and will reevaluate her in 2 months time. 03/13/2017 -- he saw Dr. Trula Slade regarding her vascular issues and he would like to wait to the sacral wound is completely closed before considering aortobifemoral bypass graft. He will see her back in 2 months time. She was also seen by Dr. Sharol Given of orthopedics who recommended continue wound care dressings and follow in the office in about 2 months time. he also recommended 3 view radiographs of the right ankle at follow-up. 03/28/2017 -- she did have a PICC line placed and Dr. Lucianne Lei dam has begun IV vancomycin and ceftriaxone. she is going to continue this until she sees him in approximately a month'[name] time Engineer, maintenance) Signed: 03/28/2017 2:38:55 PM By: Christin Fudge MD, FACS Entered By: Christin Fudge on 03/28/2017 14:38:55 [name], [name] ([id number]) -------------------------------------------------------------------------------- Physical Exam Details Patient Name: [name], [name]. Date of Service: 03/28/2017 2:00 PM Medical Record Number: [id number] Patient  Account Number: [id number] Date of Birth/Sex: 1966/10/13 (51 y.o. Female) Treating RN: Baruch Gouty, RN, [name], [employer] Primary Care Provider: Delight Stare Other Clinician: Referring Provider: Delight Stare Treating Provider/Extender: Frann Rider in Treatment: 9 Constitutional . Pulse regular. Respirations normal and unlabored. Afebrile. . Eyes Nonicteric. Reactive to light. Ears,  Nose, Mouth, and Throat Lips, teeth, and gums WNL.Marland Kitchen Moist mucosa without lesions. Neck supple and nontender. No palpable supraclavicular or cervical adenopathy. Normal sized without goiter. Respiratory WNL. No retractions.. Breath sounds WNL, No rubs, rales, rhonchi, or wheeze.. Cardiovascular Heart rhythm and rate regular, no murmur or gallop.. Pedal Pulses WNL. No clubbing, cyanosis or edema. Chest Breasts symmetical and no nipple discharge.. Breast tissue WNL, no masses, lumps, or tenderness.. Lymphatic No adneopathy. No adenopathy. No adenopathy. Musculoskeletal Adexa without tenderness or enlargement.. Digits and nails w/o clubbing, cyanosis, infection, petechiae, ischemia, or inflammatory conditions.. Integumentary (Hair, Skin) No suspicious lesions. No crepitus or fluctuance. No peri-wound warmth or erythema. No masses.Marland Kitchen Psychiatric Judgement and insight Intact.. No evidence of depression, anxiety, or agitation.. Notes the right gluteal wound is a bit macerated at the lower edge as she had taken out her wound VAC 2 days ago. The right lateral ankle wound has minimal subcutaneous debris and are sharply remove this with a #3 curet and brisk bleeding controlled with pressure Electronic Signature([name]) Signed: 03/28/2017 2:39:38 PM By: Christin Fudge MD, FACS Entered By: Christin Fudge on 03/28/2017 14:39:37 [name], [name] ([id number]) -------------------------------------------------------------------------------- Physician Orders Details Patient Name: [name]. Date of Service: 03/28/2017 2:00  PM Medical Record Number: [id number] Patient Account Number: [id number] Date of Birth/Sex: Jul 13, 1966 (51 y.o. Female) Treating RN: Afful, RN, [name], Velva Harman Primary Care Provider: Delight Stare Other Clinician: Referring Provider: Delight Stare Treating Provider/Extender: Frann Rider in Treatment: 9 Verbal / Phone Orders: No Diagnosis Coding Wound Cleansing Wound #1 Right,Lateral Malleolus o Clean wound with Normal Saline. Wound #2 Right Gluteus o Clean wound with Normal Saline. Anesthetic Wound #1 Right,Lateral Malleolus o Topical Lidocaine 4% cream applied to wound bed prior to debridement Wound #2 Right Gluteus o Topical Lidocaine 4% cream applied to wound bed prior to debridement - in clinic Skin Barriers/Peri-Wound Care Wound #1 Right,Lateral Malleolus o Skin Prep - in clinic Wound #2 Right Gluteus o Skin Prep Primary Wound Dressing Wound #1 Right,Lateral Malleolus o Prisma Ag - please pack into the under mining'[name]. Wound #2 Right Gluteus o Hydrafera Blue - apply to open granulated area. o Prisma Ag - please apply to the base of the wound before the black foam Secondary Dressing Wound #1 Right,Lateral Malleolus o Dry Gauze o Boardered Foam Dressing - in clinic until [facility] applies VAC Wound #2 Right Gluteus o Boardered Foam Dressing - in [facility] [place] only - HHRN to replace NPWT tomorrow DEBANHI, BLAKER ([id number]) Dressing Change Frequency Wound #1 Right,Lateral Malleolus o Change dressing every day. Wound #2 Right Gluteus o Change Dressing Monday, Wednesday, Friday Follow-up Appointments Wound #1 Right,Lateral Malleolus o Return Appointment in 1 week. Wound #2 Right Gluteus o Return Appointment in 1 week. Additional Orders / Instructions Wound #1 Right,Lateral Malleolus o Stop Smoking o Increase protein intake. o Activity as tolerated o Other: - Please add vitamin A, vitamin C and zinc supplements to your diet Wound #2  Right Gluteus o Stop Smoking o Increase protein intake. o Activity as tolerated o Other: - Please add vitamin A, vitamin C and zinc supplements to your diet Home Health Wound #1 [place] Visits - [place] Nurse may visit PRN to address patientos wound care needs. o FACE TO FACE ENCOUNTER: MEDICARE and MEDICAID PATIENTS: I certify that this patient is under my care and that I had a face-to-face encounter that meets the physician face-to-face encounter requirements with this patient on  this date. The encounter with the patient was in whole or in part for the following MEDICAL CONDITION: (primary reason for [place]) MEDICAL NECESSITY: I certify, that based on my findings, NURSING services are a medically necessary home health service. HOME BOUND STATUS: I certify that my clinical findings support that this patient is homebound (i.e., Due to illness or injury, pt requires aid of supportive devices such as crutches, cane, wheelchairs, walkers, the use of special transportation or the assistance of another person to leave their place of residence. There is a normal inability to leave the home and doing so requires considerable and taxing effort. Other absences are for medical reasons / religious services and are infrequent or of short duration when for other reasons). o If current dressing causes regression in wound condition, may D/C ordered dressing product/[name] and apply Normal Saline Moist Dressing daily until next [place] / Other MD appointment. [place] of regression in wound condition at [phone]. ZAYLIN, PISTILLI ([id number]) o Please direct any NON-WOUND related issues/requests for orders to patient'[name] Primary Care Physician Wound #2 Right [place] Visits - [place] Nurse may visit PRN to address patientos wound  care needs. o FACE TO FACE ENCOUNTER: MEDICARE and MEDICAID PATIENTS: I certify that this patient is under my care and that I had a face-to-face encounter that meets the physician face-to-face encounter requirements with this patient on this date. The encounter with the patient was in whole or in part for the following MEDICAL CONDITION: (primary reason for [place]) MEDICAL NECESSITY: I certify, that based on my findings, NURSING services are a medically necessary home health service. HOME BOUND STATUS: I certify that my clinical findings support that this patient is homebound (i.e., Due to illness or injury, pt requires aid of supportive devices such as crutches, cane, wheelchairs, walkers, the use of special transportation or the assistance of another person to leave their place of residence. There is a normal inability to leave the home and doing so requires considerable and taxing effort. Other absences are for medical reasons / religious services and are infrequent or of short duration when for other reasons). o If current dressing causes regression in wound condition, may D/C ordered dressing product/[name] and apply Normal Saline Moist Dressing daily until next [place] / Other MD appointment. [place] of regression in wound condition at [phone]. o Please direct any NON-WOUND related issues/requests for orders to patient'[name] Primary Care Physician Negative Pressure Wound Therapy Wound #2 Right Gluteus o Wound VAC settings at 125/130 mmHg continuous pressure. Use BLACK/GREEN foam to wound cavity. Use WHITE foam to fill any tunnel/[name] and/or undermining. Change VAC dressing 3 X WEEK. Change canister as indicated when full. Nurse may titrate settings and frequency of dressing changes as clinically indicated. - black foam to wound cavity only - use Hydrafera blue to granulated tissue Electronic Signature([name]) Signed: 03/28/2017 3:55:17 PM By:  Christin Fudge MD, FACS Signed: 03/28/2017 4:40:27 PM By: Regan Lemming [name], RN Entered By: Regan Lemming on 03/28/2017 14:30:48 [place], [name] ([id number]) -------------------------------------------------------------------------------- Problem List Details Patient Name: ZELPHIA, GLOVER. Date of Service: 03/28/2017 2:00 PM Medical Record Number: [id number] Patient Account Number: [id number] Date of Birth/Sex: 1966/03/28 (51 y.o. Female) Treating RN: Baruch Gouty, RN, [name], Velva Harman Primary Care Provider: Delight Stare Other Clinician: Referring Provider: Delight Stare Treating Provider/Extender: Frann Rider in Treatment: 9 Active Problems ICD-10 Encounter Code Description Active Date Diagnosis E11.621  Type 2 diabetes mellitus with foot ulcer 01/23/2017 Yes L89.314 Pressure ulcer of right buttock, stage 4 01/23/2017 Yes L97.312 Non-pressure chronic ulcer of right ankle with fat layer 01/23/2017 Yes exposed F17.218 Nicotine dependence, cigarettes, with other nicotine- 01/23/2017 Yes induced disorders I70.233 Atherosclerosis of native arteries of right leg with 01/23/2017 Yes ulceration of ankle M86.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 ([id number]) -------------------------------------------------------------------------------- Progress Note Details Patient Name: [name]. Date of Service: 03/28/2017 2:00 PM Medical Record Number: [id number] Patient Account Number: [id number] Date of Birth/Sex: 05-Feb-1966 (51 y.o. Female) Treating RN: Afful, RN, [name], Administrator, sports Primary Care Provider: Delight Stare Other Clinician: Referring Provider: Delight Stare Treating Provider/Extender: Frann Rider in Treatment: 9 Subjective Chief Complaint Information obtained from Patient Patients  presents for treatment of an open diabetic ulcer with an arterial etiology on her right lateral ankle and right gluteal area for about 4 months History of Present Illness (HPI) The following HPI elements were documented for the patient'[name] wound: Location: right lateral ankle and right gluteal area Quality: Patient reports experiencing a sharp pain to affected area([name]). Severity: Patient states wound are getting worse. Duration: Patient has had the wound for > 4 months prior to seeking treatment at the wound center Timing: Pain in wound is constant (hurts all the time) Context: The wound occurred when the patient was in hospital with a necrotizing fasciitis of the right gluteal area and a ulceration on the right ankle Modifying Factors: Other treatment([name]) tried include:admitted to the hospital for IV antibiotics and a full workup and has also had a recent angiogram Associated Signs and Symptoms: Patient reports having increase discharge. 51 year old patient was sent to [place] from [facility] where she was seen by Dr. Sherril Cong for a left ankle ulceration and was recently hospitalized with hypotension and sepsis. She was treated with IV antibiotics and has been scheduled to see Dr. Sharol Given. She was seen by vascular surgery who recommended a femoral bypass but surgery has been delayed until her sacral wound from last year'[name] necrotizing fasciitis has healed. Was seeing the wound care team at [facility] but wanted to change over. She is a smoker and smokes a pack of cigarettes a day The patient was recently admitted in [place] between February 2 and February 14. She had a follow- up to see vascular surgery, orthopedic surgery and infectious disease. During her admission she was known to have peripheral arterial disease, diabetes mellitus with neuropathy, chronic pain, open wound with necrotizing fasciitis of the sacral area which had been there since November 2017 Past medical history  significant for diabetes mellitus, ankle ulcer, sacral ulcer, necrotizing fasciitis, arterial occlusive disease, tobacco abuse. Review of the electronic medical records reveals that Dr. 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 An MRI showed edema in the bone which was consistent with osteomyelitis and the chronic ulcer and may need surgical intervention. She also had a sacral decubitus ulcer which was treated with the wound VAC. On 01/07/2017 she was taken up by the vascular surgeon Dr. Trula Slade for an abdominal aortogram and bilateral lower extremity runoff, for a history of having bilateral femoropopliteal bypass graft as well as external iliac stenting on the right and stenting of her bypass  graft. She had developed a nonhealing wound on her right ankle and there was a possibility of a femoral occlusion. the findings were noted and the [name], [name]. ([id number]) impression was a surgical revascularization with a aorto bifemoral bypass graft. Both the femoropopliteal bypass grafts were patent. 01/31/2017 -- x-ray of the right hip and pelvis -- IMPRESSION:No radiographic evidence of acute osteomyelitis. Normal-appearing right hip joint space for age. No acute bony abnormality of the hip. 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 Since seeing her last week she has not had an appointment with infectious disease or the orthopedic specialist yet. 02/06/2017 -- the patient missed a couple of appointments yesterday due to the weather but other than that has apparently given up smoking for the last 5 days. 02/13/2017 -- she has rescheduled her infectious disease appointment and also the appointment with the orthopedic surgeon Dr. Sharol Given 03/06/2017 -- she was seen by Dr. Lucianne Lei dam of infectious disease on 03/05/2017 and he  recommendedIV antibiotics but the patient did not want to have that and he has given her Augmentin and doxycycline and will reevaluate her in 2 months time. 03/13/2017 -- he saw Dr. Trula Slade regarding her vascular issues and he would like to wait to the sacral wound is completely closed before considering aortobifemoral bypass graft. He will see her back in 2 months time. She was also seen by Dr. Sharol Given of orthopedics who recommended continue wound care dressings and follow in the office in about 2 months time. he also recommended 3 view radiographs of the right ankle at follow-up. 03/28/2017 -- she did have a PICC line placed and Dr. Lucianne Lei dam has begun IV vancomycin and ceftriaxone. she is going to continue this until she sees him in approximately a month'[name] time Objective Constitutional Pulse regular. Respirations normal and unlabored. Afebrile. Vitals Time Taken: 2:07 PM, Height: 67 in, Weight: 137 lbs, BMI: 21.5, Temperature: 97.8 F, Pulse: 91 bpm, Respiratory Rate: 16 breaths/min, Blood Pressure: 84/63 mmHg. Eyes Nonicteric. Reactive to light. Ears, Nose, Mouth, and Throat Lips, teeth, and gums WNL.Marland Kitchen Moist mucosa without lesions. Neck supple and nontender. No palpable supraclavicular or cervical adenopathy. Normal sized without goiter. Respiratory WNL. No retractions.. Breath sounds WNL, No rubs, rales, rhonchi, or wheeze.Marland Kitchen Klugh, Eisa [name]. ([id number]) Cardiovascular Heart rhythm and rate regular, no murmur or gallop.. Pedal Pulses WNL. No clubbing, cyanosis or edema. Chest Breasts symmetical and no nipple discharge.. Breast tissue WNL, no masses, lumps, or tenderness.. Lymphatic No adneopathy. No adenopathy. No adenopathy. Musculoskeletal Adexa without tenderness or enlargement.. Digits and nails w/o clubbing, cyanosis, infection, petechiae, ischemia, or inflammatory conditions.Marland Kitchen Psychiatric Judgement and insight Intact.. No evidence of depression, anxiety, or  agitation.. General Notes: the right gluteal wound is a bit macerated at the lower edge as she had taken out her wound VAC 2 days ago. The right lateral ankle wound has minimal subcutaneous debris and are sharply remove this with a #3 curet and brisk bleeding controlled with pressure Integumentary (Hair, Skin) No suspicious lesions. No crepitus or fluctuance. No peri-wound warmth or erythema. No masses.. Wound #1 status is Open. Original cause of wound was Gradually Appeared. The wound is located on the Right,Lateral Malleolus. The wound measures 1.6cm length x 2cm width x 0.3cm depth; 2.513cm^2 area and 0.754cm^3 volume. There is Fat Layer (Subcutaneous Tissue) Exposed exposed. There is no tunneling noted, however, there is undermining starting at 12:00 and ending at 12:00 with a  maximum distance of 1.5cm. There is a medium amount of serosanguineous drainage noted. The wound margin is distinct with the outline attached to the wound base. There is medium (34-66%) pink granulation within the wound bed. There is a medium (34-66%) amount of necrotic tissue within the wound bed including Adherent Slough. The periwound skin appearance exhibited: Induration, Maceration. The periwound skin appearance did not exhibit: Callus, Crepitus, Excoriation, Rash, Scarring, Dry/Scaly, Atrophie Blanche, Cyanosis, Ecchymosis, Hemosiderin Staining, Mottled, Pallor, Rubor, Erythema. Periwound temperature was noted as No Abnormality. The periwound has tenderness on palpation. Wound #2 status is Open. Original cause of wound was Gradually Appeared. The wound is located on the Right Gluteus. The wound measures 5cm length x 2cm width x 2cm depth; 7.854cm^2 area and 15.708cm^3 volume. There is Fat Layer (Subcutaneous Tissue) Exposed exposed. There is no tunneling or undermining noted. There is a large amount of serosanguineous drainage noted. The wound margin is distinct with the outline attached to the wound base. There  is medium (34-66%) pink, pale granulation within the wound bed. There is a medium (34-66%) amount of necrotic tissue within the wound bed including Adherent Slough. The periwound skin appearance exhibited: Induration, Maceration. The periwound skin appearance did not exhibit: Callus, Crepitus, Excoriation, Rash, Scarring, Dry/Scaly, Atrophie Blanche, Cyanosis, Ecchymosis, Hemosiderin Staining, Mottled, Pallor, Rubor, Erythema. Periwound temperature was noted as No Abnormality. The periwound has tenderness on palpation. 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 There was a Skin/Subcutaneous Tissue Debridement ([id number]) debridement with total area of 3.2 sq cm performed by Christin Fudge, MD. with the following instrument([name]): Curette to remove Non-Viable tissue/material including Exudate, Fat Layer (and Subcutaneous Tissue) Exposed, Fibrin/Slough, and Subcutaneous after achieving pain control using Lidocaine 4% Topical Solution. A time out was conducted at 14:28, prior to the start of the procedure. A Minimum amount of bleeding was controlled with Pressure. The procedure was tolerated well with a pain level of 0 throughout and a pain level of 0 following the procedure. Post Debridement Measurements: 1.6cm length x 2cm width x 0.3cm depth; 0.754cm^3 volume. Character of Wound/Ulcer Post Debridement is stable. Severity of Tissue Post Debridement is: Fat  layer exposed. Post procedure Diagnosis Wound #1: Same as Pre-Procedure Plan Wound Cleansing: Wound #1 Right,Lateral Malleolus: Clean wound with Normal Saline. Wound #2 Right Gluteus: Clean wound with Normal Saline. Anesthetic: Wound #1 Right,Lateral Malleolus: Topical Lidocaine 4% cream applied to wound bed prior to debridement Wound #2 Right Gluteus: Topical Lidocaine 4% cream applied to wound bed prior to debridement - in clinic [name], [name]. ([id number]) Skin Barriers/Peri-Wound Care: Wound #1 Right,Lateral Malleolus: Skin Prep - in clinic Wound #2 Right Gluteus: Skin Prep Primary Wound Dressing: Wound #1 Right,Lateral Malleolus: Prisma Ag - please pack into the under mining'[name]. Wound #2 Right Gluteus: Hydrafera Blue - apply to open granulated area. Prisma Ag - please apply to the base of the wound before the black foam Secondary Dressing: Wound #1 Right,Lateral Malleolus: Dry Gauze Boardered Foam Dressing - in clinic until [facility] applies VAC Wound #2 Right Gluteus:  Boardered Foam Dressing - in [facility] [place] only - HHRN to replace NPWT tomorrow Dressing Change Frequency: Wound #1 Right,Lateral Malleolus: Change dressing every day. Wound #2 Right Gluteus: Change Dressing Monday, Wednesday, Friday Follow-up Appointments: Wound #1 Right,Lateral Malleolus: Return Appointment in 1 week. Wound #2 Right Gluteus: Return Appointment in 1 week. Additional Orders / Instructions: Wound #1 Right,Lateral Malleolus: Stop Smoking Increase protein intake. Activity as tolerated Other: - Please add vitamin A, vitamin C and zinc supplements to your diet Wound #2 Right Gluteus: Stop Smoking Increase protein intake. Activity as tolerated Other: - Please add vitamin A, vitamin C and zinc supplements to your diet Home Health: Wound #1 Right,Lateral Malleolus: [place] Visits - [place] Nurse may visit PRN to address patient [name] wound care needs. FACE  TO FACE ENCOUNTER: MEDICARE and MEDICAID PATIENTS: I certify that this patient is under my care and that I had a face-to-face encounter that meets the physician face-to-face encounter requirements with this patient on this date. The encounter with the patient was in whole or in part for the following MEDICAL CONDITION: (primary reason for [place]) MEDICAL NECESSITY: I certify, that based on my findings, NURSING services are a medically necessary home health service. HOME BOUND STATUS: I certify that my clinical findings support that this patient is homebound (i.e., Due to illness or injury, pt requires aid of supportive devices such as crutches, cane, wheelchairs, walkers, the use of special transportation or the assistance of another person to leave their place of residence. There is a normal inability to leave the home and doing so requires considerable and taxing effort. Other absences are FANY, [place] ([id number]) for medical reasons / religious services and are infrequent or of short duration when for other reasons). If current dressing causes regression in wound condition, may D/C ordered dressing product/[name] and apply Normal Saline Moist Dressing daily until next [place] / Other MD appointment. [place] of regression in wound condition at [phone]. Please direct any NON-WOUND related issues/requests for orders to patient'[name] Primary Care Physician Wound #2 Right Gluteus: [place] Nurse may visit PRN to address patient [name] wound care needs. FACE TO FACE ENCOUNTER: MEDICARE and MEDICAID PATIENTS: I certify that this patient is under my care and that I had a face-to-face encounter that meets the physician face-to-face encounter requirements with this patient on this date. The encounter with the patient was in whole or in part for the following MEDICAL CONDITION: (primary reason for [place])  MEDICAL NECESSITY: I certify, that based on my findings, NURSING services are a medically necessary home health service. HOME BOUND STATUS: I certify that my clinical findings support that this patient is homebound (i.e., Due to illness or injury, pt requires aid of supportive devices such as crutches, cane, wheelchairs, walkers, the use of special transportation or the assistance of another person to leave their place of residence. There is a normal inability to leave the home and doing so requires considerable and taxing effort. Other absences are for medical reasons / religious services and are infrequent or of short duration when for other reasons). If current dressing causes regression in wound condition, may D/C ordered dressing product/[name] and apply Normal Saline Moist Dressing daily until next [place] / Other MD appointment. [place] of regression in wound condition at [phone]. Please direct any NON-WOUND related issues/requests for orders to patient'[name] Primary Care Physician  Negative Pressure Wound Therapy: Wound #2 Right Gluteus: Wound VAC settings at 125/130 mmHg continuous pressure. Use BLACK/GREEN foam to wound cavity. Use WHITE foam to fill any tunnel/[name] and/or undermining. Change VAC dressing 3 X WEEK. Change canister as indicated when full. Nurse may titrate settings and frequency of dressing changes as clinically indicated. - black foam to wound cavity only - use Hydrafera blue to granulated tissue She has now begun IV antibiotics through a PICC line and has had this adjusted by infectious disease. After review I have recommended: 1. Silver collagen to be applied to the right ankle, change daily 2. Hydrofera Blue to the superficial wound and then black foam (silver collagen below this) for the wound VAC, to the right gluteal region to be changed 3 times a week. 3. adequate protein, vitamin A, vitamin C and zinc She has had all questions  answered and will be compliant Electronic Signature([name]) Signed: 03/28/2017 2:40:44 PM By: Christin Fudge MD, FACS ARNALL, Srija SMarland Kitchen ([id number]) Entered By: Christin Fudge on 03/28/2017 14:40:44 Tendler, Claudeen [name]. ([id number]) -------------------------------------------------------------------------------- SuperBill Details Patient Name: [name]. Date of Service: 03/28/2017 Medical Record Number: [id number] Patient Account Number: [id number] Date of Birth/Sex: [birth year] (51 y.o. 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, right ankle and foot Facility Procedures CPT4 Code Description: [id number] 11042 - DEB SUBQ TISSUE 20 SQ CM/< ICD-10 Description Diagnosis E11.621 Type 2 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, stage 4 L97.312 Non-pressure chronic ulcer of right ankle with fat l  M86.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, right ankle and fo Modifier: ayer exposed ot Quantity: 1 Physician Procedures CPT4 Code Description: [id number] 71062 - WC PHYS LEVEL 3 - EST PT ICD-10 Description Diagnosis E11.621 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 ([id number]) Modifier: Quantity: 1 Electronic Signature([name]) Signed: 03/28/2017 2:41:14 PM By: Christin Fudge MD, FACS Entered By: Christin Fudge on 03/28/2017 14:41:14

## 2017-03-30 NOTE — Progress Notes (Addendum)
JERAE, IZARD ([id number]) Visit Report for 03/28/2017 Arrival Information Details Patient Name: [name], [name]. Date of Service: 03/28/2017 2:00 PM Medical Record Number: [id number] Patient Account Number: [id number] Date of Birth/Sex: Aug 25, 1966 (51 y.o. Female) Treating RN: Afful, RN, [name], Velva Harman Primary Care Shaterra Sanzone: Delight Stare Other Clinician: Referring Paysley Poplar: Delight Stare Treating Zykera Abella/Extender: Frann Rider in Treatment: 9 Visit Information History Since Last Visit All ordered tests and consults were completed: No Patient Arrived: Gilford Rile Added or deleted any medications: No Arrival Time: 14:03 Any new allergies or adverse reactions: No Accompanied By: self Had a fall or experienced change in No Transfer Assistance: None activities of daily living that may affect Patient Identification Verified: Yes risk of falls: Secondary Verification Process Completed: Yes Signs or symptoms of abuse/neglect since last No Patient Requires Transmission-Based No visito Precautions: Hospitalized since last visit: No Patient Has Alerts: No Has Dressing in Place as Prescribed: Yes Pain Present Now: Yes Electronic Signature(s) Signed: 03/28/2017 2:04:01 PM By: Regan Lemming [name], RN Entered By: Regan Lemming on 03/28/2017 14:04:00 Mette, [name] ([id number]) -------------------------------------------------------------------------------- Encounter Discharge Information Details Patient Name: [name]. Date of Service: 03/28/2017 2:00 PM Medical Record Number: [id number] Patient Account Number: [id number] Date of Birth/Sex: 14-Jan-1966 (51 y.o. Female) Treating RN: Afful, RN, [name], Velva Harman Primary Care Krystine Pabst: Delight Stare Other Clinician: Referring Bren Steers: Delight Stare Treating Jaonna Word/Extender: Frann Rider in Treatment: 9 Encounter Discharge Information Items Discharge Pain Level: 0 Discharge Condition: Stable Ambulatory Status: Walker Discharge  Destination: Home Transportation: Private Auto Accompanied By: self Schedule Follow-up Appointment: No Medication Reconciliation completed No and provided to Patient/Care Madaleine Simmon: Provided on Clinical Summary of Care: 03/28/2017 Form Type Recipient Paper Patient RM Electronic Signature(s) Signed: 03/28/2017 2:52:44 PM By: Ruthine Dose Entered By: Ruthine Dose on 03/28/2017 14:52:44 Bartok, [name] ([id number]) -------------------------------------------------------------------------------- Lower Extremity Assessment Details Patient Name: [name]. Date of Service: 03/28/2017 2:00 PM Medical Record Number: [id number] Patient Account Number: [id number] Date of Birth/Sex: 1965/12/17 (51 y.o. Female) Treating RN: Afful, RN, [name], [employer] Primary Care Timmi Devora: Delight Stare Other Clinician: Referring Gracynn Rajewski: Delight Stare Treating Starlina Lapre/Extender: Frann Rider in Treatment: 9 Edema Assessment Assessed: [Left: No] [Right: No] Edema: [Left: N] [Right: o] Vascular Assessment Claudication: Claudication Assessment [Right:None] Pulses: Dorsalis Pedis Palpable: [Right:Yes] Posterior Tibial Extremity colors, hair growth, and conditions: Extremity Color: [Right:Normal] Hair Growth on Extremity: [Right:No] Temperature of Extremity: [Right:Warm] Capillary Refill: [Right:< 3 seconds] Toe Nail Assessment Left: Right: Thick: Yes Discolored: Yes Deformed: Yes Improper Length and Hygiene: Yes Electronic Signature(s) Signed: 03/28/2017 4:40:27 PM By: Regan Lemming [name], RN Entered By: Regan Lemming on 03/28/2017 14:11:52 Espiritu, Tihanna S. ([id number]) -------------------------------------------------------------------------------- Multi Wound Chart Details Patient Name: [name]. Date of Service: 03/28/2017 2:00 PM Medical Record Number: [id number] Patient Account Number: [id number] Date of Birth/Sex: [birth year] (51 y.o. Female) Treating RN: Baruch Gouty, RN, [name], Velva Harman Primary  Care Peace Jost: Delight Stare Other Clinician: Referring Daisy Lites: Delight Stare Treating Shamarie Call/Extender: Frann Rider in Treatment: 9 Vital Signs Height(in): 67 Pulse(bpm): 91 Weight(lbs): 137 Blood Pressure 84/63 (mmHg): Body Mass Index(BMI): 21 Temperature(F): 97.8 Respiratory Rate 16 (breaths/min): Photos: [1:No Photos] [2:No Photos] [N/A:N/A] Wound Location: [1:Right Malleolus - Lateral] [2:Right Gluteus] [N/A:N/A] Wounding Event: [1:Gradually Appeared] [2:Gradually Appeared] [N/A:N/A] Primary Etiology: [1:Diabetic Wound/Ulcer of the Lower Extremity] [2:Open Surgical Wound] [N/A:N/A] Comorbid History: [1:Anemia, Arrhythmia, Coronary Artery Disease, Hypotension, Peripheral Arterial Disease, Type II Diabetes, Osteoarthritis] [2:Anemia, Arrhythmia, Coronary Artery Disease, Hypotension, Peripheral Arterial Disease, Type II Diabetes,  Osteoarthritis] [N/A:N/A] Date Acquired: [1:09/23/2016] [2:09/18/2016] [N/A:N/A]  Weeks of Treatment: [1:9] [2:9] [N/A:N/A] Wound Status: [1:Open] [2:Open] [N/A:N/A] Measurements L x W x D 1.6x2x0.3 [2:5x2x2] [N/A:N/A] (cm) Area (cm) : [1:2.513] [2:2.979] [N/A:N/A] Volume (cm) : [1:0.754] [2:15.708] [N/A:N/A] % Reduction in Area: [1:36.00%] [2:59.20%] [N/A:N/A] % Reduction in Volume: 61.60% [2:83.70%] [N/A:N/A] Starting Position 1 12 (o'clock): Ending Position 1 [1:12] (o'clock): Maximum Distance 1 1.5 (cm): Undermining: [1:Yes] [2:No] [N/A:N/A] Classification: [1:Grade 2] [2:Full Thickness Without Exposed Support Structures] [N/A:N/A] Exudate Amount: [1:Medium] [2:Large] [N/A:N/A] Exudate Type: Serosanguineous Serosanguineous N/A Exudate Color: red, brown red, brown N/A Wound Margin: Distinct, outline attached Distinct, outline attached N/A Granulation Amount: Medium (34-66%) Medium (34-66%) N/A Granulation Quality: Pink Pink, Pale N/A Necrotic Amount: Medium (34-66%) Medium (34-66%) N/A Exposed Structures: Fat Layer  (Subcutaneous Fat Layer (Subcutaneous N/A Tissue) Exposed: Yes Tissue) Exposed: Yes Fascia: No Fascia: No Tendon: No Tendon: No Muscle: No Muscle: No Joint: No Joint: No Bone: No Bone: No Epithelialization: None None N/A Debridement: Debridement ([id number] N/A N/A [id number]) Pre-procedure 14:28 N/A N/A Verification/Time Out Taken: Pain Control: Lidocaine 4% Topical N/A N/A Solution Tissue Debrided: Fibrin/Slough, Fat, N/A N/A Exudates, Subcutaneous Level: Skin/Subcutaneous N/A N/A Tissue Debridement Area (sq 3.2 N/A N/A cm): Instrument: Curette N/A N/A Bleeding: Minimum N/A N/A Hemostasis Achieved: Pressure N/A N/A Procedural Pain: 0 N/A N/A Post Procedural Pain: 0 N/A N/A Debridement Treatment Procedure was tolerated N/A N/A Response: well Post Debridement 1.6x2x0.3 N/A N/A Measurements L x W x D (cm) Post Debridement 0.754 N/A N/A Volume: (cm) Periwound Skin Texture: Induration: Yes Induration: Yes N/A Excoriation: No Excoriation: No Callus: No Callus: No Crepitus: No Crepitus: No Rash: No Rash: No Scarring: No Scarring: No Periwound Skin Maceration: Yes Maceration: Yes N/A Moisture: Dry/Scaly: No Dry/Scaly: No Periwound Skin Color: Atrophie Blanche: No Atrophie Blanche: No N/A Cyanosis: No Cyanosis: No Ecchymosis: No Ecchymosis: No Martinson, Ruhama S. ([id number]) Erythema: No Erythema: No Hemosiderin Staining: No Hemosiderin Staining: No Mottled: No Mottled: No Pallor: No Pallor: No Rubor: No Rubor: No Temperature: No Abnormality No Abnormality N/A Tenderness on Yes Yes N/A Palpation: Wound Preparation: Ulcer Cleansing: Ulcer Cleansing: N/A Rinsed/Irrigated with Rinsed/Irrigated with Saline Saline Topical Anesthetic Topical Anesthetic Applied: Other: lidocaine Applied: Other: lidocaine 4% 4% Procedures Performed: Debridement N/A N/A Treatment Notes Electronic Signature(s) Signed: 03/28/2017 2:34:46 PM By: Christin Fudge MD, FACS Entered  By: Christin Fudge on 03/28/2017 14:34:46 Bergstresser, [name] ([id number]) -------------------------------------------------------------------------------- [place] Details Patient Name: [name], [name]. Date of Service: 03/28/2017 2:00 PM Medical Record Number: [id number] Patient Account Number: [id number] Date of Birth/Sex: 02-Jul-1966 (51 y.o. Female) Treating RN: Afful, RN, [name], [employer] Primary Care Ansley Mangiapane: Delight Stare Other Clinician: Referring Kobyn Kray: Delight Stare Treating Lamond Glantz/Extender: Frann Rider in Treatment: 9 Active Inactive ` Orientation to the Wound Care Program Nursing Diagnoses: Knowledge deficit related to the wound healing center program Goals: Patient/caregiver will verbalize understanding of the [place] Program Date Initiated: 01/23/2017 Target Resolution Date: 05/25/2017 Goal Status: Active Interventions: Provide education on orientation to the wound center Notes: ` Peripheral Neuropathy Nursing Diagnoses: Knowledge deficit related to disease process and management of peripheral neurovascular dysfunction Potential alteration in peripheral tissue perfusion (select prior to confirmation of diagnosis) Goals: Patient/caregiver will verbalize understanding of disease process and disease management Date Initiated: 01/23/2017 Target Resolution Date: 05/25/2017 Goal Status: Active Interventions: Assess signs and symptoms of neuropathy upon admission and as needed Provide education on Management of Neuropathy and Related Ulcers Provide education on Management of Neuropathy upon discharge from the [facility] Treatment Activities: Test ordered  outside of clinic : 01/23/2017 Notes: HILARIE, SINHA ([id number]) ` Pressure Nursing Diagnoses: Knowledge deficit related to causes and risk factors for pressure ulcer development Knowledge deficit related to management of pressures ulcers Potential for impaired tissue integrity related  to pressure, friction, moisture, and shear Goals: Patient will remain free from development of additional pressure ulcers Date Initiated: 01/23/2017 Target Resolution Date: 05/25/2017 Goal Status: Active Patient will remain free of pressure ulcers Date Initiated: 01/23/2017 Target Resolution Date: 05/25/2017 Goal Status: Active Patient/caregiver will verbalize risk factors for pressure ulcer development Date Initiated: 01/23/2017 Target Resolution Date: 05/25/2017 Goal Status: Active Patient/caregiver will verbalize understanding of pressure ulcer management Date Initiated: 01/23/2017 Target Resolution Date: 05/25/2017 Goal Status: Active Interventions: Assess: immobility, friction, shearing, incontinence upon admission and as needed Assess offloading mechanisms upon admission and as needed Assess potential for pressure ulcer upon admission and as needed Provide education on pressure ulcers Treatment Activities: Patient referred for pressure reduction/relief devices : 01/23/2017 Patient referred for seating evaluation to ensure proper offloading : 01/23/2017 Pressure reduction/relief device ordered : 01/23/2017 Notes: ` Wound/Skin Impairment Nursing Diagnoses: Impaired tissue integrity Knowledge deficit related to smoking impact on wound healing Knowledge deficit related to ulceration/compromised skin integrity Goals: Patient/caregiver will verbalize understanding of skin care regimen DASHA, KAWABATA ([id number]) Date Initiated: 01/23/2017 Target Resolution Date: 05/25/2017 Goal Status: Active Ulcer/skin breakdown will have a volume reduction of 30% by week 4 Date Initiated: 01/23/2017 Target Resolution Date: 05/25/2017 Goal Status: Active Ulcer/skin breakdown will have a volume reduction of 50% by week 8 Date Initiated: 01/23/2017 Target Resolution Date: 05/25/2017 Goal Status: Active Ulcer/skin breakdown will have a volume reduction of 80% by week 12 Date Initiated: 01/23/2017 Target Resolution Date:  05/25/2017 Goal Status: Active Ulcer/skin breakdown will heal within 14 weeks Date Initiated: 01/23/2017 Target Resolution Date: 05/25/2017 Goal Status: Active Interventions: Assess patient/caregiver ability to obtain necessary supplies Assess patient/caregiver ability to perform ulcer/skin care regimen upon admission and as needed Assess ulceration(s) every visit Provide education on smoking Provide education on ulcer and skin care Treatment Activities: Patient referred to home care : 01/23/2017 Referred to DME Trinty Marken for dressing supplies : 01/23/2017 Skin care regimen initiated : 01/23/2017 Topical wound management initiated : 01/23/2017 Notes: Electronic Signature(s) Signed: 03/28/2017 4:40:27 PM By: Regan Lemming [name], RN Entered By: Regan Lemming on 03/28/2017 14:23:24 Turman, [name] ([id number]) -------------------------------------------------------------------------------- Pain Assessment Details Patient Name: [name]. Date of Service: 03/28/2017 2:00 PM Medical Record Number: [id number] Patient Account Number: [id number] Date of Birth/Sex: October 09, 1966 (51 y.o. Female) Treating RN: Afful, RN, [name], Velva Harman Primary Care Adhvik Canady: Delight Stare Other Clinician: Referring Tysheka Fanguy: Delight Stare Treating Ellery Tash/Extender: Frann Rider in Treatment: 9 Active Problems Location of Pain Severity and Description of Pain Patient Has Paino Yes Site Locations Pain Location: Pain in Ulcers With Dressing Change: Yes Rate the pain. Current Pain Level: 4 Character of Pain Describe the Pain: Tender Pain Management and Medication Current Pain Management: How does your wound impact your activities of daily livingo Sleep: Yes Bathing: Yes Appetite: Yes Relationship With Others: Yes Bladder Continence: Yes Emotions: Yes Bowel Continence: Yes Work: Yes Toileting: Yes Drive: Yes Dressing: Yes Hobbies: Yes Electronic Signature(s) Signed: 03/28/2017 2:04:18 PM By: Regan Lemming [name],  RN Entered By: Regan Lemming on 03/28/2017 14:04:18 Ashe, [name] ([id number]) -------------------------------------------------------------------------------- Patient/Caregiver Education Details Patient Name: [name]. Date of Service: 03/28/2017 2:00 PM Medical Record Number: [id number] Patient Account Number: [id number] Date of Birth/Gender: March 05, 1966 (51 y.o. Female) Treating RN: Afful, RN,  [name], Velva Harman Primary Care Physician: Delight Stare Other Clinician: Referring Physician: Delight Stare Treating Physician/Extender: Frann Rider in Treatment: 9 Education Assessment Education Provided To: Patient Education Topics Provided Peripheral Neuropathy: Methods: Explain/Verbal Responses: State content correctly Pressure: Methods: Explain/Verbal Responses: State content correctly Smoking and Wound Healing: Methods: Explain/Verbal Responses: State content correctly Welcome To The [place]: Methods: Explain/Verbal Responses: State content correctly Wound/Skin Impairment: Methods: Explain/Verbal Responses: State content correctly Electronic Signature(s) Signed: 03/28/2017 4:40:27 PM By: Regan Lemming [name], RN Entered By: Regan Lemming on 03/28/2017 14:50:36 Birnie, [name] ([id number]) -------------------------------------------------------------------------------- Wound Assessment Details Patient Name: [name]. Date of Service: 03/28/2017 2:00 PM Medical Record Number: [id number] Patient Account Number: [id number] Date of Birth/Sex: Aug 01, 1966 (51 y.o. Female) Treating RN: Afful, RN, [name], [employer] Primary Care Jleigh Striplin: Delight Stare Other Clinician: Referring Andriy Sherk: Delight Stare Treating Ranny Wiebelhaus/Extender: Frann Rider in Treatment: 9 Wound Status Wound Number: 1 Primary Diabetic Wound/Ulcer of the Lower Etiology: Extremity Wound Location: Right Malleolus - Lateral Wound Open Wounding Event: Gradually Appeared Status: Date Acquired:  09/23/2016 Comorbid Anemia, Arrhythmia, Coronary Artery Weeks Of Treatment: 9 History: Disease, Hypotension, Peripheral Clustered Wound: No Arterial Disease, Type II Diabetes, Osteoarthritis Photos Photo Uploaded By: Regan Lemming on 03/28/2017 16:28:35 Wound Measurements Length: (cm) 1.6 Width: (cm) 2 Depth: (cm) 0.3 Area: (cm) 2.513 Volume: (cm) 0.754 % Reduction in Area: 36% % Reduction in Volume: 61.6% Epithelialization: None Tunneling: No Undermining: Yes Starting Position (o'clock): 12 Ending Position (o'clock): 12 Maximum Distance: (cm) 1.5 Wound Description Classification: Grade 2 Wound Margin: Distinct, outline attached Exudate Amount: Medium Exudate Type: Serosanguineous Exudate Color: red, brown Wagler, Analayah S. ([id number]) Foul Odor After Cleansing: No Slough/Fibrino Yes Wound Bed Granulation Amount: Medium (34-66%) Exposed Structure Granulation Quality: Pink Fascia Exposed: No Necrotic Amount: Medium (34-66%) Fat Layer (Subcutaneous Tissue) Exposed: Yes Necrotic Quality: Adherent Slough Tendon Exposed: No Muscle Exposed: No Joint Exposed: No Bone Exposed: No Periwound Skin Texture Texture Color No Abnormalities Noted: No No Abnormalities Noted: No Callus: No Atrophie Blanche: No Crepitus: No Cyanosis: No Excoriation: No Ecchymosis: No Induration: Yes Erythema: No Rash: No Hemosiderin Staining: No Scarring: No Mottled: No Pallor: No Moisture Rubor: No No Abnormalities Noted: No Dry / Scaly: No Temperature / Pain Maceration: Yes Temperature: No Abnormality Tenderness on Palpation: Yes Wound Preparation Ulcer Cleansing: Rinsed/Irrigated with Saline Topical Anesthetic Applied: Other: lidocaine 4%, Treatment Notes Wound #1 (Right, Lateral Malleolus) 1. Cleansed with: Clean wound with Normal Saline 4. Dressing Applied: Prisma Ag 5. Secondary Dressing Applied Bordered Foam Dressing Dry Gauze 8. Negative Pressure Wound  Therapy Wound Vac to wound continuously at 132mm/hg pressure Black Foam Notes 2 back foam applied Electronic Signature(s) Signed: 03/28/2017 4:40:27 PM By: Regan Lemming [name], RN Entered By: Regan Lemming on 03/28/2017 14:22:42 Sampsel, [name] ([id number]) Beaubien, Malisha Chauncey Cruel ([id number]) -------------------------------------------------------------------------------- Wound Assessment Details Patient Name: [name]. Date of Service: 03/28/2017 2:00 PM Medical Record Number: [id number] Patient Account Number: [id number] Date of Birth/Sex: 01/29/1966 (51 y.o. Female) Treating RN: Afful, RN, [name], Velva Harman Primary Care Jaevin Medearis: Delight Stare Other Clinician: Referring Winola Drum: Delight Stare Treating Kiante Ciavarella/Extender: Frann Rider in Treatment: 9 Wound Status Wound Number: 2 Primary Open Surgical Wound Etiology: Wound Location: Right Gluteus Wound Open Wounding Event: Gradually Appeared Status: Date Acquired: 09/18/2016 Comorbid Anemia, Arrhythmia, Coronary Artery Weeks Of Treatment: 9 History: Disease, Hypotension, Peripheral Clustered Wound: No Arterial Disease, Type II Diabetes, Osteoarthritis Photos Photo Uploaded By: Regan Lemming on 03/28/2017 16:28:35 Wound Measurements Length: (cm) 5 Width: (cm) 2  Depth: (cm) 2 Area: (cm) 7.854 Volume: (cm) 15.708 % Reduction in Area: 59.2% % Reduction in Volume: 83.7% Epithelialization: None Tunneling: No Undermining: No Wound Description Full Thickness Without Exposed Classification: Support Structures Wound Margin: Distinct, outline attached Exudate Large Amount: Exudate Type: Serosanguineous Exudate Color: red, brown Foul Odor After Cleansing: No Slough/Fibrino Yes Wound Bed Granulation Amount: Medium (34-66%) Exposed Structure Soyars, Cedricka S. ([id number]) Granulation Quality: Pink, Pale Fascia Exposed: No Necrotic Amount: Medium (34-66%) Fat Layer (Subcutaneous Tissue) Exposed: Yes Necrotic Quality: Adherent  Slough Tendon Exposed: No Muscle Exposed: No Joint Exposed: No Bone Exposed: No Periwound Skin Texture Texture Color No Abnormalities Noted: No No Abnormalities Noted: No Callus: No Atrophie Blanche: No Crepitus: No Cyanosis: No Excoriation: No Ecchymosis: No Induration: Yes Erythema: No Rash: No Hemosiderin Staining: No Scarring: No Mottled: No Pallor: No Moisture Rubor: No No Abnormalities Noted: No Dry / Scaly: No Temperature / Pain Maceration: Yes Temperature: No Abnormality Tenderness on Palpation: Yes Wound Preparation Ulcer Cleansing: Rinsed/Irrigated with Saline Topical Anesthetic Applied: Other: lidocaine 4%, Treatment Notes Wound #2 (Right Gluteus) 1. Cleansed with: Clean wound with Normal Saline 4. Dressing Applied: Prisma Ag 5. Secondary Dressing Applied Bordered Foam Dressing Dry Gauze 8. Negative Pressure Wound Therapy Wound Vac to wound continuously at 114mm/hg pressure Black Foam Notes 2 back foam applied Electronic Signature(s) Signed: 03/28/2017 4:40:27 PM By: Regan Lemming [name], RN Entered By: Regan Lemming on 03/28/2017 14:23:12 Wimberly, [name] ([id number]) -------------------------------------------------------------------------------- Vitals Details Patient Name: [name]. Date of Service: 03/28/2017 2:00 PM Medical Record Number: [id number] Patient Account Number: [id number] Date of Birth/Sex: 01-17-1966 (51 y.o. Female) Treating RN: Afful, RN, [name], Velva Harman Primary Care Breion Novacek: Delight Stare Other Clinician: Referring Reality Dejonge: Delight Stare Treating Sufyaan Palma/Extender: Frann Rider in Treatment: 9 Vital Signs Time Taken: 14:07 Temperature (F): 97.8 Height (in): 67 Pulse (bpm): 91 Weight (lbs): 137 Respiratory Rate (breaths/min): 16 Body Mass Index (BMI): 21.5 Blood Pressure (mmHg): 84/63 Reference Range: 80 - 120 mg / dl Electronic Signature(s) Signed: 03/28/2017 4:40:27 PM By: Regan Lemming [name], RN Entered By: Regan Lemming on 03/28/2017 14:13:47

## 2017-03-31 ENCOUNTER — Encounter: Payer: Self-pay | Admitting: Infectious Disease

## 2017-04-01 ENCOUNTER — Other Ambulatory Visit: Payer: Self-pay | Admitting: Cardiology

## 2017-04-01 DIAGNOSIS — Z01818 Encounter for other preprocedural examination: Secondary | ICD-10-CM

## 2017-04-01 DIAGNOSIS — I2489 Other forms of acute ischemic heart disease: Secondary | ICD-10-CM

## 2017-04-01 DIAGNOSIS — I248 Other forms of acute ischemic heart disease: Secondary | ICD-10-CM

## 2017-04-01 DIAGNOSIS — R0609 Other forms of dyspnea: Secondary | ICD-10-CM

## 2017-04-02 ENCOUNTER — Other Ambulatory Visit (HOSPITAL_COMMUNITY)
Admission: RE | Admit: 2017-04-02 | Discharge: 2017-04-02 | Disposition: A | Discharge status: Home / Self Care | Payer: Medicare Other | Source: Other Acute Inpatient Hospital | Attending: Infectious Disease | Admitting: Infectious Disease

## 2017-04-02 DIAGNOSIS — Z792 Long term (current) use of antibiotics: Secondary | ICD-10-CM | POA: Insufficient documentation

## 2017-04-02 LAB — BASIC METABOLIC PANEL
ANION GAP: 8 (ref 5–15)
BUN: 29 mg/dL — ABNORMAL HIGH (ref 6–20)
CHLORIDE: 101 mmol/L (ref 101–111)
CO2: 29 mmol/L (ref 22–32)
Calcium: 9.1 mg/dL (ref 8.9–10.3)
Creatinine, Ser: 1.12 mg/dL — ABNORMAL HIGH (ref 0.44–1.00)
GFR calc Af Amer: >60 mL/min (ref >60)
GFR calc non Af Amer: 55 mL/min — ABNORMAL LOW (ref >60)
GLUCOSE: 104 mg/dL — AB (ref 65–99)
Potassium: 5.4 mmol/L — ABNORMAL HIGH (ref 3.5–5.1)
Sodium: 138 mmol/L (ref 135–145)

## 2017-04-02 LAB — SEDIMENTATION RATE: Sed Rate: 54 mm/hr — ABNORMAL HIGH (ref 0–22)

## 2017-04-02 LAB — VANCOMYCIN, TROUGH: VANCOMYCIN TR: 24 ug/mL — AB (ref 15–20)

## 2017-04-02 LAB — C-REACTIVE PROTEIN: CRP: 1.7 mg/dL — ABNORMAL HIGH (ref <1.0)

## 2017-04-04 ENCOUNTER — Other Ambulatory Visit (HOSPITAL_COMMUNITY)
Admission: RE | Admit: 2017-04-04 | Discharge: 2017-04-04 | Disposition: A | Discharge status: Home / Self Care | Payer: Medicare Other | Source: Other Acute Inpatient Hospital | Attending: Infectious Disease | Admitting: Infectious Disease

## 2017-04-04 ENCOUNTER — Ambulatory Visit: Payer: Medicare Other | Admitting: Surgery

## 2017-04-04 ENCOUNTER — Telehealth: Payer: Self-pay | Admitting: *Deleted

## 2017-04-04 DIAGNOSIS — Z792 Long term (current) use of antibiotics: Secondary | ICD-10-CM | POA: Diagnosis present

## 2017-04-04 DIAGNOSIS — Z5181 Encounter for therapeutic drug level monitoring: Secondary | ICD-10-CM | POA: Insufficient documentation

## 2017-04-04 LAB — BASIC METABOLIC PANEL
ANION GAP: 8 (ref 5–15)
BUN: 35 mg/dL — ABNORMAL HIGH (ref 6–20)
CALCIUM: 8.8 mg/dL — AB (ref 8.9–10.3)
CO2: 24 mmol/L (ref 22–32)
Chloride: 106 mmol/L (ref 101–111)
Creatinine, Ser: 1.58 mg/dL — ABNORMAL HIGH (ref 0.44–1.00)
GFR, EST AFRICAN AMERICAN: 42 mL/min — AB (ref >60)
GFR, EST NON AFRICAN AMERICAN: 37 mL/min — AB (ref >60)
Glucose, Bld: 87 mg/dL (ref 65–99)
POTASSIUM: 5.1 mmol/L (ref 3.5–5.1)
SODIUM: 138 mmol/L (ref 135–145)

## 2017-04-04 LAB — VANCOMYCIN, TROUGH: VANCOMYCIN TR: 11 ug/mL — AB (ref 15–20)

## 2017-04-04 NOTE — Telephone Encounter (Signed)
[facility] called to get a verbal to receertify the patient for continue of PICC care. Gave the verbal with the expectation that they will send paperwork for the provider to sign.

## 2017-04-07 ENCOUNTER — Other Ambulatory Visit (HOSPITAL_COMMUNITY)
Admission: RE | Admit: 2017-04-07 | Discharge: 2017-04-07 | Disposition: A | Discharge status: Home / Self Care | Payer: Medicare Other | Source: Other Acute Inpatient Hospital | Attending: Infectious Disease | Admitting: Infectious Disease

## 2017-04-07 DIAGNOSIS — Z792 Long term (current) use of antibiotics: Secondary | ICD-10-CM | POA: Diagnosis present

## 2017-04-07 DIAGNOSIS — Z5181 Encounter for therapeutic drug level monitoring: Secondary | ICD-10-CM | POA: Insufficient documentation

## 2017-04-07 DIAGNOSIS — O4192X1 Disorder of amniotic fluid and membranes, unspecified, second trimester, fetus 1: Secondary | ICD-10-CM | POA: Insufficient documentation

## 2017-04-07 LAB — CBC WITH DIFFERENTIAL/PLATELET
Basophils Absolute: 0 10*3/uL (ref 0.0–0.1)
Basophils Relative: 1 %
Eosinophils Absolute: 0.3 10*3/uL (ref 0.0–0.7)
Eosinophils Relative: 4 %
HEMATOCRIT: 37 % (ref 36.0–46.0)
HEMOGLOBIN: 12 g/dL (ref 12.0–15.0)
LYMPHS ABS: 2.2 10*3/uL (ref 0.7–4.0)
Lymphocytes Relative: 26 %
MCH: 26.8 pg (ref 26.0–34.0)
MCHC: 32.4 g/dL (ref 30.0–36.0)
MCV: 82.8 fL (ref 78.0–100.0)
MONO ABS: 0.4 10*3/uL (ref 0.1–1.0)
MONOS PCT: 5 %
NEUTROS ABS: 5.6 10*3/uL (ref 1.7–7.7)
NEUTROS PCT: 64 %
Platelets: 302 10*3/uL (ref 150–400)
RBC: 4.47 MIL/uL (ref 3.87–5.11)
RDW: 15.7 % — AB (ref 11.5–15.5)
WBC: 8.6 10*3/uL (ref 4.0–10.5)

## 2017-04-07 LAB — BASIC METABOLIC PANEL
ANION GAP: 8 (ref 5–15)
BUN: 29 mg/dL — ABNORMAL HIGH (ref 6–20)
CHLORIDE: 105 mmol/L (ref 101–111)
CO2: 25 mmol/L (ref 22–32)
Calcium: 9.2 mg/dL (ref 8.9–10.3)
Creatinine, Ser: 1.09 mg/dL — ABNORMAL HIGH (ref 0.44–1.00)
GFR calc non Af Amer: 57 mL/min — ABNORMAL LOW (ref >60)
GLUCOSE: 118 mg/dL — AB (ref 65–99)
Potassium: 4.6 mmol/L (ref 3.5–5.1)
Sodium: 138 mmol/L (ref 135–145)

## 2017-04-07 LAB — VANCOMYCIN, RANDOM: Vancomycin Rm: 5

## 2017-04-09 ENCOUNTER — Other Ambulatory Visit (HOSPITAL_COMMUNITY)
Admission: AD | Admit: 2017-04-09 | Discharge: 2017-04-09 | Disposition: A | Discharge status: Home / Self Care | Payer: Medicare Other | Source: Skilled Nursing Facility | Attending: Infectious Disease | Admitting: Infectious Disease

## 2017-04-09 DIAGNOSIS — Z792 Long term (current) use of antibiotics: Secondary | ICD-10-CM | POA: Insufficient documentation

## 2017-04-09 LAB — BASIC METABOLIC PANEL
Anion gap: 9 (ref 5–15)
BUN: 21 mg/dL — ABNORMAL HIGH (ref 6–20)
CALCIUM: 9.1 mg/dL (ref 8.9–10.3)
CO2: 24 mmol/L (ref 22–32)
CREATININE: 1.06 mg/dL — AB (ref 0.44–1.00)
Chloride: 104 mmol/L (ref 101–111)
GFR calc non Af Amer: 59 mL/min — ABNORMAL LOW (ref >60)
Glucose, Bld: 200 mg/dL — ABNORMAL HIGH (ref 65–99)
Potassium: 4.8 mmol/L (ref 3.5–5.1)
SODIUM: 137 mmol/L (ref 135–145)

## 2017-04-09 LAB — VANCOMYCIN, RANDOM

## 2017-04-10 ENCOUNTER — Ambulatory Visit: Payer: Medicare Other | Admitting: Surgery

## 2017-04-11 ENCOUNTER — Encounter: Payer: Medicare Other | Admitting: Surgery

## 2017-04-11 DIAGNOSIS — E11621 Type 2 diabetes mellitus with foot ulcer: Secondary | ICD-10-CM | POA: Diagnosis not present

## 2017-04-13 NOTE — Progress Notes (Addendum)
HOUA, NIE ([id number]) Visit Report for 04/11/2017 Chief Complaint Document Details Patient Name: [name], [name]. Date of Service: 04/11/2017 2:30 PM Medical Record Number: [id number] Patient Account Number: [id number] Date of Birth/Sex: 1966-11-02 (51 y.o. Female) Treating RN: Afful, RN, [name], Velva Harman Primary Care Provider: Delight Stare Other Clinician: Referring Provider: Delight Stare Treating Provider/Extender: Frann Rider in Treatment: 11 Information Obtained from: Patient Chief Complaint Patients presents for treatment of an open diabetic ulcer with an arterial etiology on her right lateral ankle and right gluteal area for about 4 months Electronic Signature(s) Signed: 04/11/2017 2:47:56 PM By: Christin Fudge MD, FACS Entered By: Christin Fudge on 04/11/2017 14:47:56 Vaillancourt, [name] ([id number]) -------------------------------------------------------------------------------- HPI Details Patient Name: [name]. Date of Service: 04/11/2017 2:30 PM Medical Record Number: [id number] Patient Account Number: [id number] Date of Birth/Sex: Nov 21, 1965 (51 y.o. Female) Treating RN: Afful, RN, [name], Velva Harman Primary Care Provider: Delight Stare Other Clinician: Referring Provider: Delight Stare Treating Provider/Extender: Frann Rider in Treatment: 11 History of Present Illness Location: right lateral ankle and right gluteal area Quality: Patient reports experiencing a sharp pain to affected area(s). Severity: Patient states wound are getting worse. Duration: Patient has had the wound for > 4 months prior to seeking treatment at the wound center Timing: Pain in wound is constant (hurts all the time) Context: The wound occurred when the patient was in hospital with a necrotizing fasciitis of the right gluteal area and a ulceration on the right ankle Modifying Factors: Other treatment(s) tried include:admitted to the hospital for IV antibiotics and a full workup and has also  had a recent angiogram Associated Signs and Symptoms: Patient reports having increase discharge. HPI Description: 51 year old patient was sent to [place] from [facility] where she was seen by Dr. Sherril Cong for a left ankle ulceration and was recently hospitalized with hypotension and sepsis. She was treated with IV antibiotics and has been scheduled to see Dr. Sharol Given. She was seen by vascular surgery who recommended a femoral bypass but surgery has been delayed until her sacral wound from last year's necrotizing fasciitis has healed. Was seeing the wound care team at [facility] but wanted to change over. She is a smoker and smokes a pack of cigarettes a day The patient was recently admitted in [place] between February 2 and February 14. She had a follow- up to see vascular surgery, orthopedic surgery and infectious disease. 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 Review of the electronic medical records reveals that Dr. 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 An MRI showed edema in the bone which was consistent with osteomyelitis and the chronic ulcer and may need surgical intervention. She also had a sacral decubitus ulcer which was treated with the wound VAC. On 01/07/2017 she was taken up by the vascular surgeon Dr. Trula Slade for an abdominal aortogram and bilateral lower extremity runoff, for a history of having bilateral femoropopliteal bypass graft as well as external iliac stenting on the right and stenting of her bypass graft. She had developed a  nonhealing wound on her right ankle and there was a possibility of a femoral  occlusion. the findings were noted and the impression was a surgical revascularization with a aorto bifemoral bypass graft. Both the femoropopliteal bypass grafts were patent. 01/31/2017 -- x-ray of the right hip and pelvis -- IMPRESSION:No radiographic evidence of acute osteomyelitis. Normal-appearing right hip joint space for age. No acute bony abnormality of the hip. 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 Since seeing her last week she has not had an appointment with infectious disease or the orthopedic specialist yet. DABRIA, WADAS ([id number]) 02/06/2017 -- the patient missed a couple of appointments yesterday due to the weather but other than that has apparently given up smoking for the last 5 days. 02/13/2017 -- she has rescheduled her infectious disease appointment and also the appointment with the orthopedic surgeon Dr. Sharol Given 03/06/2017 -- she was seen by Dr. Lucianne Lei dam of infectious disease on 03/05/2017 and he recommendedIV antibiotics but the patient did not want to have that and he has given her Augmentin and doxycycline and will reevaluate her in 2 months time. 03/13/2017 -- he saw Dr. Trula Slade regarding her vascular issues and he would like to wait to the sacral wound is completely closed before considering aortobifemoral bypass graft. He will see her back in 2 months time. She was also seen by Dr. Sharol Given of orthopedics who recommended continue wound care dressings and follow in the office in about 2 months time. he also recommended 3 view radiographs of the right ankle at follow-up. 03/28/2017 -- she did have a PICC line placed and Dr. Lucianne Lei dam has begun IV vancomycin and ceftriaxone. she is going to continue this until she sees him in approximately a month's time Engineer, maintenance) Signed: 04/11/2017 2:48:02 PM By: Christin Fudge MD,  FACS Entered By: Christin Fudge on 04/11/2017 14:48:02 Pitones, [name] ([id number]) -------------------------------------------------------------------------------- Physical Exam Details Patient Name: [name], [name] Date of Service: 04/11/2017 2:30 PM Medical Record Number: [id number] Patient Account Number: [id number] Date of Birth/Sex: 1965/12/09 (51 y.o. Female) Treating RN: Baruch Gouty, RN, [name], [employer] Primary Care Provider: Delight Stare Other Clinician: Referring Provider: Delight Stare Treating Provider/Extender: Frann Rider in Treatment: 11 Constitutional . Pulse regular. Respirations normal and unlabored. Afebrile. . Eyes Nonicteric. Reactive to light. Ears, Nose, Mouth, and Throat Lips, teeth, and gums WNL.Marland Kitchen Moist mucosa without lesions. Neck supple and nontender. No palpable supraclavicular or cervical adenopathy. Normal sized without goiter. Respiratory WNL. No retractions.. Cardiovascular Pedal Pulses WNL. No clubbing, cyanosis or edema. Chest Breasts symmetical and no nipple discharge.. Breast tissue WNL, no masses, lumps, or tenderness.. Lymphatic No adneopathy. No adenopathy. No adenopathy. Musculoskeletal Adexa without tenderness or enlargement.. Digits and nails w/o clubbing, cyanosis, infection, petechiae, ischemia, or inflammatory conditions.. Integumentary (Hair, Skin) No suspicious lesions. No crepitus or fluctuance. No peri-wound warmth or erythema. No masses.Marland Kitchen Psychiatric Judgement and insight Intact.. No evidence of depression, anxiety, or agitation.. Notes her right ankle wound is looking very clean and no debridement was required today I was able to clean it out nicely with a Q-tip and there is some undermining. The right gluteal wound is not macerated today and the hyper granulation tissue is much less now. We will continue with the wound VAC. Electronic Signature(s) Signed: 04/11/2017 2:48:42 PM By: Christin Fudge MD, FACS Entered By: Christin Fudge  on 04/11/2017 14:48:42 Joos, [name] ([id number]) -------------------------------------------------------------------------------- Physician Orders Details Patient Name: AARADHYA, KYSAR. Date of Service: 04/11/2017 2:30 PM Medical Record Number: [id number] Patient Account Number: [id number] Date  of Birth/Sex: Oct 28, 1966 (51 y.o. Female) Treating RN: Baruch Gouty, RN, [name], Velva Harman Primary Care Provider: Delight Stare Other Clinician: Referring Provider: Delight Stare Treating Provider/Extender: Frann Rider in Treatment: 11 Verbal / Phone Orders: No Diagnosis Coding Wound Cleansing Wound #1 Right,Lateral Malleolus o Clean wound with Normal Saline. Wound #2 Right Gluteus o Clean wound with Normal Saline. Anesthetic Wound #1 Right,Lateral Malleolus o Topical Lidocaine 4% cream applied to wound bed prior to debridement Wound #2 Right Gluteus o Topical Lidocaine 4% cream applied to wound bed prior to debridement - in clinic Skin Barriers/Peri-Wound Care Wound #1 Right,Lateral Malleolus o Skin Prep - in clinic Wound #2 Right Gluteus o Skin Prep Primary Wound Dressing Wound #1 Right,Lateral Malleolus o Prisma Ag - please pack into the under mining's. Wound #2 Right Gluteus o Prisma Ag - please apply to the base of the wound before the black foam Secondary Dressing Wound #1 Right,Lateral Malleolus o Dry Gauze o Boardered Foam Dressing - in clinic until [facility] applies VAC Wound #2 Right Gluteus o Boardered Foam Dressing - in [facility] [place] [facility] only - HHRN to replace NPWT tomorrow KYSA, CALAIS ([id number]) Dressing Change Frequency Wound #1 Right,Lateral Malleolus o Change dressing every day. Wound #2 Right Gluteus o Change Dressing Monday, Wednesday, Friday Follow-up Appointments Wound #1 Right,Lateral Malleolus o Return Appointment in 1 week. Wound #2 Right Gluteus o Return Appointment in 1 week. Additional Orders / Instructions Wound #1 Right,Lateral  Malleolus o Stop Smoking o Increase protein intake. o Activity as tolerated o Other: - Please add vitamin A, vitamin C and zinc supplements to your diet o Other: - INSURANCE VERIFICATION FR GRAFIX OR AFFINITY Wound #2 Right Gluteus o Stop Smoking o Increase protein intake. o Activity as tolerated o Other: - Please add vitamin A, vitamin C and zinc supplements to your diet o Other: - [place] Wound #1 [place] Visits - [place] Nurse may visit PRN to address patientos wound care needs. o FACE TO FACE ENCOUNTER: MEDICARE and MEDICAID PATIENTS: I certify that this patient is under my care and that I had a face-to-face encounter that meets the physician face-to-face encounter requirements with this patient on this date. The encounter with the patient was in whole or in part for the following MEDICAL CONDITION: (primary reason for [place]) MEDICAL NECESSITY: I certify, that based on my findings, NURSING services are a medically necessary home health service. HOME BOUND STATUS: I certify that my clinical findings support that this patient is homebound (i.e., Due to illness or injury, pt requires aid of supportive devices such as crutches, cane, wheelchairs, walkers, the use of special transportation or the assistance of another person to leave their place of residence. There is a normal inability to leave the home and doing so requires considerable and taxing effort. Other absences are for medical reasons / religious services and are infrequent or of short duration when for other reasons). o If current dressing causes regression in wound condition, may D/C ordered dressing product/s and apply Normal Saline Moist Dressing daily until next [place] / Other MD appointment. [place] of regression in wound condition at  [phone]. THERSA, MOHIUDDIN ([id number]) o Please direct any NON-WOUND related issues/requests for orders to patient's Primary Care Physician Wound #2 Right [place] Visits - [place] Nurse may visit PRN to address patientos wound care  needs. o FACE TO FACE ENCOUNTER: MEDICARE and MEDICAID PATIENTS: I certify that this patient is under my care and that I had a face-to-face encounter that meets the physician face-to-face encounter requirements with this patient on this date. The encounter with the patient was in whole or in part for the following MEDICAL CONDITION: (primary reason for [place]) MEDICAL NECESSITY: I certify, that based on my findings, NURSING services are a medically necessary home health service. HOME BOUND STATUS: I certify that my clinical findings support that this patient is homebound (i.e., Due to illness or injury, pt requires aid of supportive devices such as crutches, cane, wheelchairs, walkers, the use of special transportation or the assistance of another person to leave their place of residence. There is a normal inability to leave the home and doing so requires considerable and taxing effort. Other absences are for medical reasons / religious services and are infrequent or of short duration when for other reasons). o If current dressing causes regression in wound condition, may D/C ordered dressing product/s and apply Normal Saline Moist Dressing daily until next [place] / Other MD appointment. [place] of regression in wound condition at [phone]. o Please direct any NON-WOUND related issues/requests for orders to patient's Primary Care Physician Negative Pressure Wound Therapy Wound #2 Right Gluteus o Wound VAC settings at 125/130 mmHg continuous pressure. Use BLACK/GREEN foam to wound cavity. Use WHITE foam to fill any tunnel/s and/or undermining. Change  VAC dressing 3 X WEEK. Change canister as indicated when full. Nurse may titrate settings and frequency of dressing changes as clinically indicated. - black foam to wound cavity only - use Hydrafera blue to granulated tissue Electronic Signature(s) Signed: 04/11/2017 3:57:04 PM By: Regan Lemming [name], RN Signed: 04/11/2017 4:01:04 PM By: Christin Fudge MD, FACS Entered By: Regan Lemming on 04/11/2017 14:59:31 Genter, [name] ([id number]) -------------------------------------------------------------------------------- Problem List Details Patient Name: JANIYLAH, HANNIS. Date of Service: 04/11/2017 2:30 PM Medical Record Number: [id number] Patient Account Number: [id number] Date of Birth/Sex: 1966/07/17 (51 y.o. Female) Treating RN: Afful, RN, [name], [employer] Primary Care Provider: Delight Stare Other Clinician: Referring Provider: Delight Stare Treating Provider/Extender: Frann Rider in Treatment: 11 Active Problems ICD-10 Encounter Code Description Active Date Diagnosis E11.621 Type 2 diabetes mellitus with foot ulcer 01/23/2017 Yes L89.314 Pressure ulcer of right buttock, stage 4 01/23/2017 Yes L97.312 Non-pressure chronic ulcer of right ankle with fat layer 01/23/2017 Yes exposed F17.218 Nicotine dependence, cigarettes, with other nicotine- 01/23/2017 Yes induced disorders I70.233 Atherosclerosis of native arteries of right leg with 01/23/2017 Yes ulceration of ankle M86.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 ([id number]) -------------------------------------------------------------------------------- Progress Note Details Patient Name: [name]. Date of Service: 04/11/2017 2:30 PM Medical Record Number: [id number] Patient Account Number: [id number] Date of Birth/Sex:  26-Jan-1966 (51 y.o. Female) Treating RN: Afful, RN, [name], Administrator, sports Primary Care Provider: Delight Stare Other Clinician: Referring Provider: Delight Stare Treating Provider/Extender: Frann Rider in Treatment: 11 Subjective Chief Complaint Information obtained from Patient Patients presents for treatment of an open diabetic ulcer with an arterial etiology on her right lateral ankle and right gluteal area for about 4 months History of Present Illness (HPI) The following HPI elements were documented for the patient's wound: Location: right lateral ankle and right gluteal area Quality: Patient reports experiencing a sharp pain to  affected area(s). Severity: Patient states wound are getting worse. Duration: Patient has had the wound for > 4 months prior to seeking treatment at the wound center Timing: Pain in wound is constant (hurts all the time) Context: The wound occurred when the patient was in hospital with a necrotizing fasciitis of the right gluteal area and a ulceration on the right ankle Modifying Factors: Other treatment(s) tried include:admitted to the hospital for IV antibiotics and a full workup and has also had a recent angiogram Associated Signs and Symptoms: Patient reports having increase discharge. 51 year old patient was sent to [place] from [facility] where she was seen by Dr. Sherril Cong for a left ankle ulceration and was recently hospitalized with hypotension and sepsis. She was treated with IV antibiotics and has been scheduled to see Dr. Sharol Given. She was seen by vascular surgery who recommended a femoral bypass but surgery has been delayed until her sacral wound from last year's necrotizing fasciitis has healed. Was seeing the wound care team at [facility] but wanted to change over. She is a smoker and smokes a pack of cigarettes a day The patient was recently admitted in [place] between February 2 and February 14. She had a follow- up to see vascular surgery,  orthopedic surgery and infectious disease. 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 Review of the electronic medical records reveals that Dr. 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 An MRI showed edema in the bone which was consistent with osteomyelitis and the chronic ulcer and may need surgical intervention. She also had a sacral decubitus ulcer which was treated with the wound VAC. On 01/07/2017 she was taken up by the vascular surgeon Dr. Trula Slade for an abdominal aortogram and bilateral lower extremity runoff, for a history of having bilateral femoropopliteal bypass graft as well as external iliac stenting on the right and stenting of her bypass graft. She had developed a nonhealing wound on her right ankle and there was a possibility of a femoral occlusion. the findings were noted and the LOVELL, ROE. ([id number]) impression was a surgical revascularization with a aorto bifemoral bypass graft. Both the femoropopliteal bypass grafts were patent. 01/31/2017 -- x-ray of the right hip and pelvis -- IMPRESSION:No radiographic evidence of acute osteomyelitis. Normal-appearing right hip joint space for age. No acute bony abnormality of the hip. 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 Since seeing her last week she has not had an appointment with infectious disease or the orthopedic specialist yet. 02/06/2017 -- the patient missed a couple of appointments yesterday due to the weather but other than  that has apparently given up smoking for the last 5 days. 02/13/2017 -- she has rescheduled her infectious disease appointment and also the appointment with the orthopedic surgeon Dr. Sharol Given 03/06/2017 -- she was seen by Dr. Lucianne Lei dam of infectious disease on 03/05/2017 and he recommendedIV antibiotics but the patient did not want to have that and he has given her Augmentin and doxycycline and will reevaluate her in 2 months time. 03/13/2017 -- he saw Dr. Trula Slade  regarding her vascular issues and he would like to wait to the sacral wound is completely closed before considering aortobifemoral bypass graft. He will see her back in 2 months time. She was also seen by Dr. Sharol Given of orthopedics who recommended continue wound care dressings and follow in the office in about 2 months time. he also recommended 3 view radiographs of the right ankle at follow-up. 03/28/2017 -- she did have a PICC line placed and Dr. Lucianne Lei dam has begun IV vancomycin and ceftriaxone. she is going to continue this until she sees him in approximately a month's time Objective Constitutional Pulse regular. Respirations normal and unlabored. Afebrile. Vitals Time Taken: 2:25 PM, Height: 67 in, Weight: 137 lbs, BMI: 21.5, Temperature: 97.9 F, Pulse: 79 bpm, Respiratory Rate: 17 breaths/min, Blood Pressure: 80/55 mmHg. Eyes Nonicteric. Reactive to light. Ears, Nose, Mouth, and Throat Lips, teeth, and gums WNL.Marland Kitchen Moist mucosa without lesions. Neck supple and nontender. No palpable supraclavicular or cervical adenopathy. Normal sized without goiter. Respiratory WNL. No retractions.Marland Kitchen CINNAMON, MORENCY. ([id number]) Cardiovascular Pedal Pulses WNL. No clubbing, cyanosis or edema. Chest Breasts symmetical and no nipple discharge.. Breast tissue WNL, no masses, lumps, or tenderness.. Lymphatic No adneopathy. No adenopathy. No adenopathy. Musculoskeletal Adexa without tenderness or enlargement.. Digits and nails w/o clubbing,  cyanosis, infection, petechiae, ischemia, or inflammatory conditions.Marland Kitchen Psychiatric Judgement and insight Intact.. No evidence of depression, anxiety, or agitation.. General Notes: her right ankle wound is looking very clean and no debridement was required today I was able to clean it out nicely with a Q-tip and there is some undermining. The right gluteal wound is not macerated today and the hyper granulation tissue is much less now. We will continue with the wound VAC. Integumentary (Hair, Skin) No suspicious lesions. No crepitus or fluctuance. No peri-wound warmth or erythema. No masses.. Wound #1 status is Open. Original cause of wound was Gradually Appeared. The wound is located on the Right,Lateral Malleolus. The wound measures 2cm length x 2cm width x 0.3cm depth; 3.142cm^2 area and 0.942cm^3 volume. There is Fat Layer (Subcutaneous Tissue) Exposed exposed. There is no tunneling noted, however, there is undermining starting at 12:00 and ending at 12:00 with a maximum distance of 1.2cm. There is a medium amount of serosanguineous drainage noted. The wound margin is distinct with the outline attached to the wound base. There is medium (34-66%) pink granulation within the wound bed. There is a medium (34-66%) amount of necrotic tissue within the wound bed including Adherent Slough. The periwound skin appearance exhibited: Induration, Maceration. The periwound skin appearance did not exhibit: Callus, Crepitus, Excoriation, Rash, Scarring, Dry/Scaly, Atrophie Blanche, Cyanosis, Ecchymosis, Hemosiderin Staining, Mottled, Pallor, Rubor, Erythema. Periwound temperature was noted as No Abnormality. The periwound has tenderness on palpation. Wound #2 status is Open. Original cause of wound was Gradually Appeared. The wound is located on the Right Gluteus. The wound measures 4.2cm length x 2cm width x 3.3cm depth; 6.597cm^2 area and 21.771cm^3 volume. There is Fat Layer (Subcutaneous Tissue) Exposed  exposed. There is no tunneling or undermining noted. There is a large amount of serosanguineous drainage noted. The wound margin is distinct with the outline attached to the wound base. There is medium (34-66%) pink, pale granulation within the wound bed. There is a medium (34-66%) amount of necrotic tissue within the wound bed including Adherent Slough. The periwound skin appearance exhibited: Induration, Maceration. The periwound skin appearance did not exhibit: Callus, Crepitus, Excoriation, Rash, Scarring, Dry/Scaly, Atrophie Blanche, Cyanosis, Ecchymosis, Hemosiderin Staining, Mottled, Pallor,  Rubor, Erythema. Periwound temperature was noted as No Abnormality. The periwound has tenderness on palpation. 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 Wound #2 Right Gluteus: Clean wound with Normal Saline. Anesthetic: Wound #1 Right,Lateral Malleolus: Topical Lidocaine 4% cream applied to wound bed prior to debridement Wound #2 Right Gluteus: Topical Lidocaine 4% cream applied to wound bed prior to debridement - in clinic Skin Barriers/Peri-Wound Care: Wound #1 Right,Lateral Malleolus: Skin Prep - in clinic Wound #2 Right Gluteus: Skin Prep Primary Wound Dressing: Wound #1 Right,Lateral Malleolus: Prisma Ag - please pack into the under mining's. Wound #2 Right Gluteus: Prisma Ag - please apply to the base of the wound before the black foam Secondary Dressing: Wound #1  Right,Lateral Malleolus: Dry Gauze Boardered Foam Dressing - in clinic until [facility] applies VAC Wound #2 Right Gluteus: Boardered Foam Dressing - in [facility] [place] only - HHRN to replace NPWT tomorrow Dressing Change Frequency: Wound #1 Right,Lateral Malleolus: Change dressing every day. Wound #2 Right Gluteus: Change Dressing Monday, Wednesday, Friday FOSTER, SONNIER ([id number]) Follow-up Appointments: Wound #1 Right,Lateral Malleolus: Return Appointment in 1 week. Wound #2 Right Gluteus: Return Appointment in 1 week. Additional Orders / Instructions: Wound #1 Right,Lateral Malleolus: Stop Smoking Increase protein intake. Activity as tolerated Other: - Please add vitamin A, vitamin C and zinc supplements to your diet Other: - INSURANCE VERIFICATION FR GRAFIX OR AFFINITY Wound #2 Right Gluteus: Stop Smoking Increase protein intake. Activity as tolerated Other: - Please add vitamin A, vitamin C and zinc supplements to your diet Other: - INSURANCE VERIFICATION FR GRAFIX OR AFFINITY Home Health: Wound #1 Right,Lateral Malleolus: [place] Visits - [place] Nurse may visit PRN to address patient s wound care needs. FACE TO FACE ENCOUNTER: MEDICARE and MEDICAID PATIENTS: I certify that this patient is under my care and that I had a face-to-face encounter that meets the physician face-to-face encounter requirements with this patient on this date. The encounter with the patient was in whole or in part for the following MEDICAL CONDITION: (primary reason for [place]) MEDICAL NECESSITY: I certify, that based on my findings, NURSING services are a medically necessary home health service. HOME BOUND STATUS: I certify that my clinical findings support that this patient is homebound (i.e., Due to illness or injury, pt requires aid of supportive devices such as crutches, cane, wheelchairs, walkers, the use of special transportation or the assistance of  another person to leave their place of residence. There is a normal inability to leave the home and doing so requires considerable and taxing effort. Other absences are for medical reasons / religious services and are infrequent or of short duration when for other reasons). If current dressing causes regression in wound condition, may D/C ordered dressing product/s and apply Normal Saline Moist Dressing daily until next [place] / Other MD appointment. [place] of regression in wound condition at [phone]. Please direct any NON-WOUND related issues/requests for orders to patient's Primary Care Physician Wound #2 Right Gluteus: [place]  [facility] Nurse may visit PRN to address patient s wound care needs. FACE TO FACE ENCOUNTER: MEDICARE and MEDICAID PATIENTS: I certify that this patient is under my care and that I had a face-to-face encounter that meets the physician face-to-face encounter requirements with this patient on this date. The encounter with the patient was in whole or in part for the following MEDICAL CONDITION: (primary reason for [place]) MEDICAL NECESSITY: I certify, that based on my findings, NURSING services are a medically necessary home health service. HOME BOUND STATUS: I certify that my clinical findings support that this patient is homebound (i.e., Due to illness or injury, pt requires aid of supportive devices such as crutches, cane, wheelchairs, walkers, the use of special transportation or the assistance of another person to leave their place of residence. There is a normal inability to leave the home and doing so requires considerable and taxing effort. Other absences are for medical reasons / religious services and are infrequent or of short duration when for other reasons). If current dressing causes regression in wound condition, may D/C ordered dressing product/s and apply Normal  Saline Moist Dressing daily until next [place] / Other MD appointment. Notify Wound Kluger, [place] ([id number]) [place] of regression in wound condition at [phone]. Please direct any NON-WOUND related issues/requests for orders to patient's Primary Care Physician Negative Pressure Wound Therapy: Wound #2 Right Gluteus: Wound VAC settings at 125/130 mmHg continuous pressure. Use BLACK/GREEN foam to wound cavity. Use WHITE foam to fill any tunnel/s and/or undermining. Change VAC dressing 3 X WEEK. Change canister as indicated when full. Nurse may titrate settings and frequency of dressing changes as clinically indicated. - black foam to wound cavity only - use Hydrafera blue to granulated tissue she continues to take her IV antibiotics and has been having some problems with her wound VAC which will not stay on for a long. After review I have recommended: 1. Silver collagen to be applied to the right ankle, change daily 2. Silva collagen under the black foam for the wound VAC, to the right gluteal region to be changed 3 times a week. 3. adequate protein, vitamin A, vitamin C and zinc 4. commended her on continuing to be off smoking It has been 11 weeks since we have applied aggressive wound care to her right ankle and now the wound is extremely clean, her diabetes is under control and at this stage I would like to use a skin substitute to help hasten the process of healing. We will check with her insurance companies if she is eligible. She has had all questions answered and will be compliant Electronic Signature(s) Signed: 04/11/2017 4:01:47 PM By: Christin Fudge MD, FACS Previous Signature: 04/11/2017 2:51:42 PM Version By: Christin Fudge MD, FACS Previous Signature: 04/11/2017 2:49:58 PM Version By: Christin Fudge MD, FACS Entered By: Christin Fudge on 04/11/2017 16:01:47 Hauth, Twyla Chauncey Cruel  ([id number]) -------------------------------------------------------------------------------- SuperBill Details Patient Name: [name], [name] Date of Service: 04/11/2017 Medical Record Number: [id number] Patient Account Number: [id number] Date of Birth/Sex: 1966/01/16 (51 y.o. 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, right ankle and foot Facility Procedures CPT4 Code: [id number] Description: 99213 - WOUND CARE VISIT-LEV 3 EST PT Modifier: Quantity: 1 Physician Procedures CPT4 Code Description: [id number] 94712 - WC PHYS LEVEL 3 - EST PT ICD-10 Description Diagnosis 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

## 2017-04-13 NOTE — Progress Notes (Addendum)
[name] ([id number]) Visit Report for 04/11/2017 Arrival Information Details Patient Name: [name]. Date of Service: 04/11/2017 2:30 PM Medical Record Number: [id number] Patient Account Number: [id number] Date of Birth/Sex: 22-Feb-1966 (51 y.o. Female) Treating RN: Afful, RN, [name], Velva Harman Primary Care Caragh Gasper: Delight Stare Other Clinician: Referring Waddell Iten: Delight Stare Treating Aaronjames Kelsay/Extender: Frann Rider in Treatment: 11 Visit Information History Since Last Visit All ordered tests and consults were completed: No Patient Arrived: [name] Added or deleted any medications: No Arrival Time: 14:21 Any new allergies or adverse reactions: No Accompanied By: self Had a fall or experienced change in No Transfer Assistance: None activities of daily living that may affect Patient Identification Verified: Yes risk of falls: Secondary Verification Process Completed: Yes Signs or symptoms of abuse/neglect since last No Patient Requires Transmission-Based No visito Precautions: Hospitalized since last visit: No Patient Has Alerts: No Has Dressing in Place as Prescribed: Yes Pain Present Now: Yes Electronic Signature(s) Signed: 04/11/2017 2:22:11 PM By: Regan Lemming [name], RN Entered By: Regan Lemming on 04/11/2017 14:22:11 Treml, Aisha Chauncey [name] ([id number]) -------------------------------------------------------------------------------- Clinic Level of Care Assessment Details Patient Name: [name]. Date of Service: 04/11/2017 2:30 PM Medical Record Number: [id number] Patient Account Number: [id number] Date of Birth/Sex: 07-12-1966 (51 y.o. Female) Treating RN: Afful, RN, [name], [employer] Primary Care Ceara Wrightson: Delight Stare Other Clinician: Referring Dekayla Prestridge: Delight Stare Treating Quentin Strebel/Extender: Frann Rider in Treatment: 11 Clinic Level of Care Assessment Items TOOL 4 Quantity Score []  - Use when only an EandM is performed on FOLLOW-UP visit 0 ASSESSMENTS -  Nursing Assessment / Reassessment X - Reassessment of Co-morbidities (includes updates in patient status) 1 10 X - Reassessment of Adherence to Treatment Plan 1 5 ASSESSMENTS - Wound and Skin Assessment / Reassessment []  - Simple Wound Assessment / Reassessment - one wound 0 X - Complex Wound Assessment / Reassessment - multiple wounds 2 5 []  - Dermatologic / Skin Assessment (not related to wound area) 0 ASSESSMENTS - Focused Assessment []  - Circumferential Edema Measurements - multi extremities 0 []  - Nutritional Assessment / Counseling / Intervention 0 X - Lower Extremity Assessment (monofilament, tuning fork, pulses) 1 5 []  - Peripheral Arterial Disease Assessment (using hand held doppler) 0 ASSESSMENTS - Ostomy and/or Continence Assessment and Care []  - Incontinence Assessment and Management 0 []  - Ostomy Care Assessment and Management (repouching, etc.) 0 PROCESS - Coordination of Care X - Simple Patient / Family Education for ongoing care 1 15 []  - Complex (extensive) Patient / Family Education for ongoing care 0 []  - Staff obtains Programmer, systems, Records, Test Results / Process Orders 0 X - Staff telephones HHA, Nursing Homes / Clarify orders / etc 1 10 []  - Routine Transfer to another Facility (non-emergent condition) 0 [name], [name] ([id number]) []  - Routine Hospital Admission (non-emergent condition) 0 []  - New Admissions / Biomedical engineer / Ordering NPWT, Apligraf, etc. 0 []  - Emergency Hospital Admission (emergent condition) 0 []  - Simple Discharge Coordination 0 []  - Complex (extensive) Discharge Coordination 0 PROCESS - Special Needs []  - Pediatric / Minor Patient Management 0 []  - Isolation Patient Management 0 []  - Hearing / Language / Visual special needs 0 []  - Assessment of Community assistance (transportation, D/C planning, etc.) 0 []  - Additional assistance / Altered mentation 0 []  - Support Surface(s) Assessment (bed, cushion, seat, etc.) 0 INTERVENTIONS -  Wound Cleansing / Measurement []  - Simple Wound Cleansing - one wound 0 X - Complex Wound Cleansing - multiple wounds 2 5  X - Wound Imaging (photographs - any number of wounds) 1 5 []  - Wound Tracing (instead of photographs) 0 []  - Simple Wound Measurement - one wound 0 X - Complex Wound Measurement - multiple wounds 2 5 INTERVENTIONS - Wound Dressings []  - Small Wound Dressing one or multiple wounds 0 []  - Medium Wound Dressing one or multiple wounds 0 []  - Large Wound Dressing one or multiple wounds 0 []  - Application of Medications - topical 0 []  - Application of Medications - injection 0 INTERVENTIONS - Miscellaneous []  - External ear exam 0 [name], [name] ([id number]) []  - Specimen Collection (cultures, biopsies, blood, body fluids, etc.) 0 []  - Specimen(s) / Culture(s) sent or taken to Lab for analysis 0 []  - Patient Transfer (multiple staff / Harrel Lemon Lift / Similar devices) 0 []  - Simple Staple / Suture removal (25 or less) 0 []  - Complex Staple / Suture removal (26 or more) 0 []  - Hypo / Hyperglycemic Management (close monitor of Blood Glucose) 0 []  - Ankle / Brachial Index (ABI) - do not check if billed separately 0 X - Vital Signs 1 5 Has the patient been seen at the hospital within the last three years: Yes Total Score: 85 Level Of Care: New/Established - Level 3 Electronic Signature(s) Signed: 04/11/2017 3:57:04 PM By: Regan Lemming [name], RN Entered By: Regan Lemming on 04/11/2017 14:46:56 [name], [name] ([id number]) -------------------------------------------------------------------------------- Encounter Discharge Information Details Patient Name: [name]. Date of Service: 04/11/2017 2:30 PM Medical Record Number: [id number] Patient Account Number: [id number] Date of Birth/Sex: 1966-10-08 (51 y.o. Female) Treating RN: Afful, RN, [name], Velva Harman Primary Care Carly Applegate: Delight Stare Other Clinician: Referring Tommey Barret: Delight Stare Treating Lexine Jaspers/Extender: Frann Rider in Treatment: 11 Encounter Discharge Information Items Discharge Pain Level: 0 Discharge Condition: Stable Ambulatory Status: Walker Discharge Destination: Home Transportation: Private Auto Accompanied By: self Schedule Follow-up Appointment: No Medication Reconciliation completed No and provided to Patient/Care Ryla Cauthon: Provided on Clinical Summary of Care: 04/11/2017 Form Type Recipient Paper Patient RM Electronic Signature(s) Signed: 04/11/2017 3:00:33 PM By: Ruthine Dose Entered By: Ruthine Dose on 04/11/2017 15:00:32 Simms, Saffron Chauncey [name] ([id number]) -------------------------------------------------------------------------------- Lower Extremity Assessment Details Patient Name: [name]. Date of Service: 04/11/2017 2:30 PM Medical Record Number: [id number] Patient Account Number: [id number] Date of Birth/Sex: 1966/10/07 (51 y.o. Female) Treating RN: Afful, RN, [name], Velva Harman Primary Care Falen Lehrmann: Delight Stare Other Clinician: Referring Mayo Faulk: Delight Stare Treating Sunshine Mackowski/Extender: Frann Rider in Treatment: 11 Vascular Assessment Claudication: Claudication Assessment [Right:None] Pulses: Dorsalis Pedis Palpable: [Right:Yes] Posterior Tibial Extremity colors, hair growth, and conditions: Extremity Color: [Right:Mottled] Hair Growth on Extremity: [Right:No] Temperature of Extremity: [Right:Warm] Capillary Refill: [Right:< 3 seconds] Electronic Signature(s) Signed: 04/11/2017 2:23:11 PM By: Regan Lemming [name], RN Entered By: Regan Lemming on 04/11/2017 14:23:11 [name], [name] ([id number]) -------------------------------------------------------------------------------- Multi Wound Chart Details Patient Name: [name]. Date of Service: 04/11/2017 2:30 PM Medical Record Number: [id number] Patient Account Number: [id number] Date of Birth/Sex: [birth year] (51 y.o. Female) Treating RN: Baruch Gouty, RN, [name], Velva Harman Primary Care Jahmel Flannagan: Delight Stare Other Clinician: Referring Cordera Stineman: Delight Stare Treating Noraa Pickeral/Extender: Frann Rider in Treatment: 11 Vital Signs Height(in): 67 Pulse(bpm): 79 Weight(lbs): 137 Blood Pressure 80/55 (mmHg): Body Mass Index(BMI): 21 Temperature(F): 97.9 Respiratory Rate 17 (breaths/min): Photos: [1:No Photos] [2:No Photos] [N/A:N/A] Wound Location: [1:Right Malleolus - Lateral] [2:Right Gluteus] [N/A:N/A] Wounding Event: [1:Gradually Appeared] [2:Gradually Appeared] [N/A:N/A] Primary Etiology: [1:Diabetic Wound/Ulcer of the Lower Extremity] [2:Open Surgical Wound] [N/A:N/A] Comorbid History: [1:Anemia, Arrhythmia, Coronary Artery Disease,  Hypotension, Peripheral Arterial Disease, Type II Diabetes, Osteoarthritis] [2:Anemia, Arrhythmia, Coronary Artery Disease, Hypotension, Peripheral Arterial Disease, Type II Diabetes,  Osteoarthritis] [N/A:N/A] Date Acquired: [1:09/23/2016] [2:09/18/2016] [N/A:N/A] Weeks of Treatment: [1:11] [2:11] [N/A:N/A] Wound Status: [1:Open] [2:Open] [N/A:N/A] Measurements L x W x D 2x2x0.3 [2:4.2x2x3.3] [N/A:N/A] (cm) Area (cm) : [1:3.142] [6:3.016] [N/A:N/A] Volume (cm) : [1:0.942] [0:10.932] [N/A:N/A] % Reduction in Area: [1:20.00%] [2:65.70%] [N/A:N/A] % Reduction in Volume: 52.00% [2:77.40%] [N/A:N/A] Starting Position 1 12 (o'clock): Ending Position 1 [1:12] (o'clock): Maximum Distance 1 1.2 (cm): Undermining: [1:Yes] [2:No] [N/A:N/A] Classification: [1:Grade 2] [2:Full Thickness Without Exposed Support Structures] [N/A:N/A] Exudate Amount: [1:Medium] [2:Large] [N/A:N/A] Exudate Type: Serosanguineous Serosanguineous N/A Exudate Color: red, brown red, brown N/A Wound Margin: Distinct, outline attached Distinct, outline attached N/A Granulation Amount: Medium (34-66%) Medium (34-66%) N/A Granulation Quality: Pink Pink, Pale N/A Necrotic Amount: Medium (34-66%) Medium (34-66%) N/A Exposed Structures: Fat Layer (Subcutaneous Fat Layer  (Subcutaneous N/A Tissue) Exposed: Yes Tissue) Exposed: Yes Fascia: No Fascia: No Tendon: No Tendon: No Muscle: No Muscle: No Joint: No Joint: No Bone: No Bone: No Epithelialization: None None N/A Periwound Skin Texture: Induration: Yes Induration: Yes N/A Excoriation: No Excoriation: No Callus: No Callus: No Crepitus: No Crepitus: No Rash: No Rash: No Scarring: No Scarring: No Periwound Skin Maceration: Yes Maceration: Yes N/A Moisture: Dry/Scaly: No Dry/Scaly: No Periwound Skin Color: Atrophie Blanche: No Atrophie Blanche: No N/A Cyanosis: No Cyanosis: No Ecchymosis: No Ecchymosis: No Erythema: No Erythema: No Hemosiderin Staining: No Hemosiderin Staining: No Mottled: No Mottled: No Pallor: No Pallor: No Rubor: No Rubor: No Temperature: No Abnormality No Abnormality N/A Tenderness on Yes Yes N/A Palpation: Wound Preparation: Ulcer Cleansing: Ulcer Cleansing: N/A Rinsed/Irrigated with Rinsed/Irrigated with Saline Saline Topical Anesthetic Topical Anesthetic Applied: Other: lidocaine Applied: Other: lidocaine 4% 4% Treatment Notes Electronic Signature(s) Signed: 04/11/2017 2:47:49 PM By: Christin Fudge MD, FACS Entered By: Christin Fudge on 04/11/2017 14:47:49 [name], [name] ([id number]) -------------------------------------------------------------------------------- [place] Details Patient Name: [name], [name]. Date of Service: 04/11/2017 2:30 PM Medical Record Number: [id number] Patient Account Number: [id number] Date of Birth/Sex: Mar 12, 1966 (51 y.o. Female) Treating RN: Afful, RN, [name], [employer] Primary Care Leonie Amacher: Delight Stare Other Clinician: Referring Whitleigh Garramone: Delight Stare Treating Tarique Loveall/Extender: Frann Rider in Treatment: 11 Active Inactive ` Orientation to the Wound Care Program Nursing Diagnoses: Knowledge deficit related to the wound healing center program Goals: Patient/caregiver will verbalize  understanding of the [facility] Date Initiated: 01/23/2017 Target Resolution Date: 05/25/2017 Goal Status: Active Interventions: Provide education on orientation to the wound center Notes: ` Peripheral Neuropathy Nursing Diagnoses: Knowledge deficit related to disease process and management of peripheral neurovascular dysfunction Potential alteration in peripheral tissue perfusion (select prior to confirmation of diagnosis) Goals: Patient/caregiver will verbalize understanding of disease process and disease management Date Initiated: 01/23/2017 Target Resolution Date: 05/25/2017 Goal Status: Active Interventions: Assess signs and symptoms of neuropathy upon admission and as needed Provide education on Management of Neuropathy and Related Ulcers Provide education on Management of Neuropathy upon discharge from the [facility] Activities: Test ordered outside of clinic : 01/23/2017 Notes: [name], [name] ([id number]) ` Pressure Nursing Diagnoses: Knowledge deficit related to causes and risk factors for pressure ulcer development Knowledge deficit related to management of pressures ulcers Potential for impaired tissue integrity related to pressure, friction, moisture, and shear Goals: Patient will remain free from development of additional pressure ulcers Date Initiated: 01/23/2017 Target Resolution Date: 05/25/2017 Goal Status: Active Patient will remain free of pressure ulcers  Date Initiated: 01/23/2017 Target Resolution Date: 05/25/2017 Goal Status: Active Patient/caregiver will verbalize risk factors for pressure ulcer development Date Initiated: 01/23/2017 Target Resolution Date: 05/25/2017 Goal Status: Active Patient/caregiver will verbalize understanding of pressure ulcer management Date Initiated: 01/23/2017 Target Resolution Date: 05/25/2017 Goal Status: Active Interventions: Assess: immobility, friction, shearing, incontinence upon admission and as  needed Assess offloading mechanisms upon admission and as needed Assess potential for pressure ulcer upon admission and as needed Provide education on pressure ulcers Treatment Activities: Patient referred for pressure reduction/relief devices : 01/23/2017 Patient referred for seating evaluation to ensure proper offloading : 01/23/2017 Pressure reduction/relief device ordered : 01/23/2017 Notes: ` Wound/Skin Impairment Nursing Diagnoses: Impaired tissue integrity Knowledge deficit related to smoking impact on wound healing Knowledge deficit related to ulceration/compromised skin integrity Goals: Patient/caregiver will verbalize understanding of skin care regimen [name], [name] ([id number]) Date Initiated: 01/23/2017 Target Resolution Date: 05/25/2017 Goal Status: Active Ulcer/skin breakdown will have a volume reduction of 30% by week 4 Date Initiated: 01/23/2017 Target Resolution Date: 05/25/2017 Goal Status: Active Ulcer/skin breakdown will have a volume reduction of 50% by week 8 Date Initiated: 01/23/2017 Target Resolution Date: 05/25/2017 Goal Status: Active Ulcer/skin breakdown will have a volume reduction of 80% by week 12 Date Initiated: 01/23/2017 Target Resolution Date: 05/25/2017 Goal Status: Active Ulcer/skin breakdown will heal within 14 weeks Date Initiated: 01/23/2017 Target Resolution Date: 05/25/2017 Goal Status: Active Interventions: Assess patient/caregiver ability to obtain necessary supplies Assess patient/caregiver ability to perform ulcer/skin care regimen upon admission and as needed Assess ulceration(s) every visit Provide education on smoking Provide education on ulcer and skin care Treatment Activities: Patient referred to home care : 01/23/2017 Referred to DME [facility] for dressing supplies : 01/23/2017 Skin care regimen initiated : 01/23/2017 Topical wound management initiated : 01/23/2017 Notes: Electronic Signature(s) Signed: 04/11/2017 3:57:04 PM By: Regan Lemming [name],  RN Entered By: Regan Lemming on 04/11/2017 14:40:40 [name], [name] ([id number]) -------------------------------------------------------------------------------- Pain Assessment Details Patient Name: [name]. Date of Service: 04/11/2017 2:30 PM Medical Record Number: [id number] Patient Account Number: [id number] Date of Birth/Sex: 1966/06/24 (51 y.o. Female) Treating RN: Afful, RN, [name], Velva Harman Primary Care Cledith Kamiya: Delight Stare Other Clinician: Referring Verlisa Vara: Delight Stare Treating Jemia Fata/Extender: Frann Rider in Treatment: 11 Active Problems Location of Pain Severity and Description of Pain Patient Has Paino Yes Site Locations Pain Location: Pain in Ulcers With Dressing Change: Yes Pain Management and Medication Current Pain Management: Electronic Signature(s) Signed: 04/11/2017 2:22:21 PM By: Regan Lemming [name], RN Entered By: Regan Lemming on 04/11/2017 14:22:21 [name], [name] ([id number]) -------------------------------------------------------------------------------- Patient/Caregiver Education Details Patient Name: [name]. Date of Service: 04/11/2017 2:30 PM Medical Record Number: [id number] Patient Account Number: [id number] Date of Birth/Gender: [birth year] (51 y.o. Female) Treating RN: Afful, RN, [name], Velva Harman Primary Care Physician: Delight Stare Other Clinician: Referring Physician: Delight Stare Treating Physician/Extender: Frann Rider in Treatment: 11 Education Assessment Education Provided To: Patient Education Topics Provided Peripheral Neuropathy: Methods: Explain/Verbal Responses: State content correctly Pressure: Methods: Explain/Verbal Responses: State content correctly Smoking and Wound Healing: Methods: Explain/Verbal Responses: State content correctly Welcome To [facility]: Methods: Explain/Verbal Responses: State content correctly Wound/Skin Impairment: Methods: Explain/Verbal Responses: State content  correctly Electronic Signature(s) Signed: 04/11/2017 3:57:04 PM By: Regan Lemming [name], RN Entered By: Regan Lemming on 04/11/2017 14:58:09 [name], [name] ([id number]) -------------------------------------------------------------------------------- Wound Assessment Details Patient Name: [name]. Date of Service: 04/11/2017 2:30 PM Medical Record Number: [id number] Patient Account Number: [id number] Date of Birth/Sex: Oct 18, 1966 (50 y.o.  Female) Treating RN: Afful, RN, [name], [employer] Primary Care Rudell Marlowe: Delight Stare Other Clinician: Referring Shawana Knoch: Delight Stare Treating Haroldine Redler/Extender: Frann Rider in Treatment: 11 Wound Status Wound Number: 1 Primary Diabetic Wound/Ulcer of the Lower Etiology: Extremity Wound Location: Right Malleolus - Lateral Wound Open Wounding Event: Gradually Appeared Status: Date Acquired: 09/23/2016 Comorbid Anemia, Arrhythmia, Coronary Artery Weeks Of Treatment: 11 History: Disease, Hypotension, Peripheral Clustered Wound: No Arterial Disease, Type II Diabetes, Osteoarthritis Photos Photo Uploaded By: Regan Lemming on 04/11/2017 15:55:08 Wound Measurements Length: (cm) 2 Width: (cm) 2 Depth: (cm) 0.3 Area: (cm) 3.142 Volume: (cm) 0.942 % Reduction in Area: 20% % Reduction in Volume: 52% Epithelialization: None Tunneling: No Undermining: Yes Starting Position (o'clock): 12 Ending Position (o'clock): 12 Maximum Distance: (cm) 1.2 Wound Description Classification: Grade 2 Wound Margin: Distinct, outline attached Exudate Amount: Medium Exudate Type: Serosanguineous Exudate Color: red, brown [name], Shanise S. ([id number]) Foul Odor After Cleansing: No Slough/Fibrino Yes Wound Bed Granulation Amount: Medium (34-66%) Exposed Structure Granulation Quality: Pink Fascia Exposed: No Necrotic Amount: Medium (34-66%) Fat Layer (Subcutaneous Tissue) Exposed: Yes Necrotic Quality: Adherent Slough Tendon Exposed: No Muscle Exposed:  No Joint Exposed: No Bone Exposed: No Periwound Skin Texture Texture Color No Abnormalities Noted: No No Abnormalities Noted: No Callus: No Atrophie Blanche: No Crepitus: No Cyanosis: No Excoriation: No Ecchymosis: No Induration: Yes Erythema: No Rash: No Hemosiderin Staining: No Scarring: No Mottled: No Pallor: No Moisture Rubor: No No Abnormalities Noted: No Dry / Scaly: No Temperature / Pain Maceration: Yes Temperature: No Abnormality Tenderness on Palpation: Yes Wound Preparation Ulcer Cleansing: Rinsed/Irrigated with Saline Topical Anesthetic Applied: Other: lidocaine 4%, Treatment Notes Wound #1 (Right, Lateral Malleolus) 1. Cleansed with: Clean wound with Normal Saline 4. Dressing Applied: Prisma Ag 5. Secondary Dressing Applied Bordered Foam Dressing Dry Gauze Electronic Signature(s) Signed: 04/11/2017 3:57:04 PM By: Regan Lemming [name], RN Entered By: Regan Lemming on 04/11/2017 14:34:22 Raya, [name] ([id number]) -------------------------------------------------------------------------------- Wound Assessment Details Patient Name: [name]. Date of Service: 04/11/2017 2:30 PM Medical Record Number: [id number] Patient Account Number: [id number] Date of Birth/Sex: 24-Oct-1966 (51 y.o. Female) Treating RN: Afful, RN, [name], [employer] Primary Care Tata Timmins: Delight Stare Other Clinician: Referring Faith Branan: Delight Stare Treating Ragina Fenter/Extender: Frann Rider in Treatment: 11 Wound Status Wound Number: 2 Primary Open Surgical Wound Etiology: Wound Location: Right Gluteus Wound Open Wounding Event: Gradually Appeared Status: Date Acquired: 09/18/2016 Comorbid Anemia, Arrhythmia, Coronary Artery Weeks Of Treatment: 11 History: Disease, Hypotension, Peripheral Clustered Wound: No Arterial Disease, Type II Diabetes, Osteoarthritis Photos Photo Uploaded By: Regan Lemming on 04/11/2017 15:55:31 Wound Measurements Length: (cm) 4.2 Width: (cm)  2 Depth: (cm) 3.3 Area: (cm) 6.597 Volume: (cm) 21.771 % Reduction in Area: 65.7% % Reduction in Volume: 77.4% Epithelialization: None Tunneling: No Undermining: No Wound Description Full Thickness Without Exposed Classification: Support Structures Wound Margin: Distinct, outline attached Hildebrant, Toniette S. ([id number]) Foul Odor After Cleansing: No Slough/Fibrino Yes Exudate Large Amount: Exudate Type: Serosanguineous Exudate Color: red, brown Wound Bed Granulation Amount: Medium (34-66%) Exposed Structure Granulation Quality: Pink, Pale Fascia Exposed: No Necrotic Amount: Medium (34-66%) Fat Layer (Subcutaneous Tissue) Exposed: Yes Necrotic Quality: Adherent Slough Tendon Exposed: No Muscle Exposed: No Joint Exposed: No Bone Exposed: No Periwound Skin Texture Texture Color No Abnormalities Noted: No No Abnormalities Noted: No Callus: No Atrophie Blanche: No Crepitus: No Cyanosis: No Excoriation: No Ecchymosis: No Induration: Yes Erythema: No Rash: No Hemosiderin Staining: No Scarring: No Mottled: No Pallor: No Moisture Rubor: No No Abnormalities Noted: No Dry /  Scaly: No Temperature / Pain Maceration: Yes Temperature: No Abnormality Tenderness on Palpation: Yes Wound Preparation Ulcer Cleansing: Rinsed/Irrigated with Saline Topical Anesthetic Applied: Other: lidocaine 4%, Treatment Notes Wound #2 (Right Gluteus) 1. Cleansed with: Clean wound with Normal Saline 4. Dressing Applied: Prisma Ag 5. Secondary Dressing Applied Bordered Foam Dressing Dry Gauze Electronic Signature(s) Signed: 04/11/2017 3:57:04 PM By: Regan Lemming [name], RN Entered By: Regan Lemming on 04/11/2017 14:39:26 Galentine, SULEIMA OHLENDORF ([id number]) Toole, Gorham ([id number]) -------------------------------------------------------------------------------- [facility] Details Patient Name: [name]. Date of Service: 04/11/2017 2:30 PM Medical Record Number: [id number] Patient  Account Number: [id number] Date of Birth/Sex: 25-Sep-1966 (51 y.o. Female) Treating RN: Afful, RN, [name], Velva Harman Primary Care Anabell Swint: Delight Stare Other Clinician: Referring Malia Corsi: Delight Stare Treating Kialee Kham/Extender: Frann Rider in Treatment: 11 Vital Signs Time Taken: 14:25 Temperature (F): 97.9 Height (in): 67 Pulse (bpm): 79 Weight (lbs): 137 Respiratory Rate (breaths/min): 17 Body Mass Index (BMI): 21.5 Blood Pressure (mmHg): 80/55 Reference Range: 80 - 120 mg / dl Electronic Signature(s) Signed: 04/11/2017 3:57:04 PM By: Regan Lemming [name], RN Entered By: Regan Lemming on 04/11/2017 14:28:32

## 2017-04-14 ENCOUNTER — Other Ambulatory Visit (HOSPITAL_COMMUNITY)
Admission: RE | Admit: 2017-04-14 | Discharge: 2017-04-14 | Disposition: A | Discharge status: Home / Self Care | Payer: Medicare Other | Source: Other Acute Inpatient Hospital | Attending: Infectious Disease | Admitting: Infectious Disease

## 2017-04-14 DIAGNOSIS — Z5181 Encounter for therapeutic drug level monitoring: Secondary | ICD-10-CM | POA: Diagnosis present

## 2017-04-14 LAB — BASIC METABOLIC PANEL WITH GFR
Anion gap: 9 (ref 5–15)
BUN: 26 mg/dL — ABNORMAL HIGH (ref 6–20)
CO2: 30 mmol/L (ref 22–32)
Calcium: 9.8 mg/dL (ref 8.9–10.3)
Chloride: 98 mmol/L — ABNORMAL LOW (ref 101–111)
Creatinine, Ser: 1.05 mg/dL — ABNORMAL HIGH (ref 0.44–1.00)
GFR calc Af Amer: >60 mL/min
GFR calc non Af Amer: 60 mL/min — ABNORMAL LOW
Glucose, Bld: 199 mg/dL — ABNORMAL HIGH (ref 65–99)
Potassium: 4 mmol/L (ref 3.5–5.1)
Sodium: 137 mmol/L (ref 135–145)

## 2017-04-14 LAB — CBC WITH DIFFERENTIAL/PLATELET
Basophils Absolute: 0 K/uL (ref 0.0–0.1)
Basophils Relative: 0 %
Eosinophils Absolute: 0.1 K/uL (ref 0.0–0.7)
Eosinophils Relative: 2 %
HCT: 36.5 % (ref 36.0–46.0)
Hemoglobin: 12 g/dL (ref 12.0–15.0)
Lymphocytes Relative: 25 %
Lymphs Abs: 1.7 K/uL (ref 0.7–4.0)
MCH: 27 pg (ref 26.0–34.0)
MCHC: 32.9 g/dL (ref 30.0–36.0)
MCV: 82 fL (ref 78.0–100.0)
Monocytes Absolute: 0.3 K/uL (ref 0.1–1.0)
Monocytes Relative: 5 %
Neutro Abs: 4.6 K/uL (ref 1.7–7.7)
Neutrophils Relative %: 68 %
Platelets: 226 K/uL (ref 150–400)
RBC: 4.45 MIL/uL (ref 3.87–5.11)
RDW: 15.4 % (ref 11.5–15.5)
WBC: 6.7 K/uL (ref 4.0–10.5)

## 2017-04-14 LAB — VANCOMYCIN, TROUGH: Vancomycin Tr: 12 ug/mL — ABNORMAL LOW (ref 15–20)

## 2017-04-16 ENCOUNTER — Other Ambulatory Visit (HOSPITAL_COMMUNITY)
Admission: RE | Admit: 2017-04-16 | Discharge: 2017-04-16 | Disposition: A | Discharge status: Home / Self Care | Payer: Medicare Other | Source: Other Acute Inpatient Hospital | Attending: Infectious Disease | Admitting: Infectious Disease

## 2017-04-16 DIAGNOSIS — Z792 Long term (current) use of antibiotics: Secondary | ICD-10-CM | POA: Insufficient documentation

## 2017-04-16 DIAGNOSIS — Z5181 Encounter for therapeutic drug level monitoring: Secondary | ICD-10-CM | POA: Insufficient documentation

## 2017-04-16 LAB — BASIC METABOLIC PANEL
ANION GAP: 12 (ref 5–15)
BUN: 20 mg/dL (ref 6–20)
CALCIUM: 9.6 mg/dL (ref 8.9–10.3)
CO2: 31 mmol/L (ref 22–32)
Chloride: 93 mmol/L — ABNORMAL LOW (ref 101–111)
Creatinine, Ser: 0.98 mg/dL (ref 0.44–1.00)
GLUCOSE: 245 mg/dL — AB (ref 65–99)
Potassium: 4.2 mmol/L (ref 3.5–5.1)
Sodium: 136 mmol/L (ref 135–145)

## 2017-04-16 LAB — SEDIMENTATION RATE: SED RATE: 50 mm/h — AB (ref 0–22)

## 2017-04-16 LAB — VANCOMYCIN, TROUGH: VANCOMYCIN TR: 7 ug/mL — AB (ref 15–20)

## 2017-04-16 LAB — C-REACTIVE PROTEIN: CRP: <0.8 mg/dL (ref <1.0)

## 2017-04-17 ENCOUNTER — Other Ambulatory Visit: Payer: Self-pay

## 2017-04-17 ENCOUNTER — Ambulatory Visit (INDEPENDENT_AMBULATORY_CARE_PROVIDER_SITE_OTHER): Payer: Medicare Other

## 2017-04-17 DIAGNOSIS — R0609 Other forms of dyspnea: Secondary | ICD-10-CM | POA: Diagnosis not present

## 2017-04-17 DIAGNOSIS — Z01818 Encounter for other preprocedural examination: Secondary | ICD-10-CM

## 2017-04-17 DIAGNOSIS — I248 Other forms of acute ischemic heart disease: Secondary | ICD-10-CM | POA: Diagnosis not present

## 2017-04-18 ENCOUNTER — Encounter: Payer: Medicare Other | Attending: Surgery | Admitting: Surgery

## 2017-04-18 DIAGNOSIS — Z880 Allergy status to penicillin: Secondary | ICD-10-CM | POA: Diagnosis not present

## 2017-04-18 DIAGNOSIS — F17218 Nicotine dependence, cigarettes, with other nicotine-induced disorders: Secondary | ICD-10-CM | POA: Insufficient documentation

## 2017-04-18 DIAGNOSIS — L97312 Non-pressure chronic ulcer of right ankle with fat layer exposed: Secondary | ICD-10-CM | POA: Diagnosis not present

## 2017-04-18 DIAGNOSIS — M86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, right ankle and foot: Secondary | ICD-10-CM | POA: Diagnosis not present

## 2017-04-18 DIAGNOSIS — I70233 Atherosclerosis of native arteries of right leg with ulceration of ankle: Secondary | ICD-10-CM | POA: Insufficient documentation

## 2017-04-18 DIAGNOSIS — M1990 Unspecified osteoarthritis, unspecified site: Secondary | ICD-10-CM | POA: Diagnosis not present

## 2017-04-18 DIAGNOSIS — I2510 Atherosclerotic heart disease of native coronary artery without angina pectoris: Secondary | ICD-10-CM | POA: Diagnosis not present

## 2017-04-18 DIAGNOSIS — D649 Anemia, unspecified: Secondary | ICD-10-CM | POA: Diagnosis not present

## 2017-04-18 DIAGNOSIS — E1151 Type 2 diabetes mellitus with diabetic peripheral angiopathy without gangrene: Secondary | ICD-10-CM | POA: Insufficient documentation

## 2017-04-18 DIAGNOSIS — L89314 Pressure ulcer of right buttock, stage 4: Secondary | ICD-10-CM | POA: Diagnosis not present

## 2017-04-18 DIAGNOSIS — E11621 Type 2 diabetes mellitus with foot ulcer: Secondary | ICD-10-CM | POA: Insufficient documentation

## 2017-04-21 ENCOUNTER — Other Ambulatory Visit (HOSPITAL_COMMUNITY)
Admission: RE | Admit: 2017-04-21 | Discharge: 2017-04-21 | Disposition: A | Discharge status: Home / Self Care | Payer: Medicare Other | Source: Other Acute Inpatient Hospital | Attending: Infectious Disease | Admitting: Infectious Disease

## 2017-04-21 DIAGNOSIS — Z5181 Encounter for therapeutic drug level monitoring: Secondary | ICD-10-CM | POA: Diagnosis present

## 2017-04-21 DIAGNOSIS — Z792 Long term (current) use of antibiotics: Secondary | ICD-10-CM | POA: Insufficient documentation

## 2017-04-21 LAB — BASIC METABOLIC PANEL
Anion gap: 9 (ref 5–15)
BUN: 23 mg/dL — AB (ref 6–20)
CALCIUM: 9 mg/dL (ref 8.9–10.3)
CHLORIDE: 99 mmol/L — AB (ref 101–111)
CO2: 29 mmol/L (ref 22–32)
Creatinine, Ser: 1.01 mg/dL — ABNORMAL HIGH (ref 0.44–1.00)
GFR calc Af Amer: >60 mL/min (ref >60)
GFR calc non Af Amer: >60 mL/min (ref >60)
Glucose, Bld: 256 mg/dL — ABNORMAL HIGH (ref 65–99)
Potassium: 3.7 mmol/L (ref 3.5–5.1)
Sodium: 137 mmol/L (ref 135–145)

## 2017-04-21 LAB — CBC WITH DIFFERENTIAL/PLATELET
BASOS PCT: 0 %
Basophils Absolute: 0 10*3/uL (ref 0.0–0.1)
EOS ABS: 0.3 10*3/uL (ref 0.0–0.7)
EOS PCT: 3 %
HCT: 38.5 % (ref 36.0–46.0)
Hemoglobin: 12.5 g/dL (ref 12.0–15.0)
LYMPHS ABS: 2 10*3/uL (ref 0.7–4.0)
Lymphocytes Relative: 21 %
MCH: 26.8 pg (ref 26.0–34.0)
MCHC: 32.5 g/dL (ref 30.0–36.0)
MCV: 82.6 fL (ref 78.0–100.0)
MONO ABS: 0.5 10*3/uL (ref 0.1–1.0)
MONOS PCT: 5 %
Neutro Abs: 6.7 10*3/uL (ref 1.7–7.7)
Neutrophils Relative %: 71 %
PLATELETS: 245 10*3/uL (ref 150–400)
RBC: 4.66 MIL/uL (ref 3.87–5.11)
RDW: 14.7 % (ref 11.5–15.5)
WBC: 9.6 10*3/uL (ref 4.0–10.5)

## 2017-04-21 LAB — VANCOMYCIN, TROUGH: VANCOMYCIN TR: 15 ug/mL (ref 15–20)

## 2017-04-21 NOTE — Progress Notes (Signed)
[name], [name] ([id number]) Visit Report for 04/18/2017 Arrival Information Details Patient Name: [name], [name]. Date of Service: 04/18/2017 12:30 PM Medical Record Number: [id number] Patient Account Number: [id number] Date of Birth/Sex: 03-28-1966 (51 y.o. Female) Treating [name]: [name] Primary Care [name]: [name] Other Clinician: Referring [name]: [name] Treating [name]/Extender: [name] in Treatment: 12 Visit Information History Since Last Visit Added or deleted any medications: No Patient Arrived: [name] Any new allergies or adverse reactions: No Arrival Time: 12:37 Had a fall or experienced change in No Accompanied By: self activities of daily living that may affect Transfer Assistance: None risk of falls: Patient Identification Verified: Yes Signs or symptoms of abuse/neglect since last No Secondary Verification Process Completed: Yes visito Patient Requires Transmission-Based No Hospitalized since last visit: No Precautions: Has Dressing in Place as Prescribed: Yes Patient Has Alerts: No Pain Present Now: Yes Electronic Signature(s) Signed: 04/20/2017 6:48:20 PM By: [name], [name], [name], CWS, [name], [name] Entered By: [name], [name], [name], CWS, Jamie on 04/18/2017 12:38:29 [name] ([id number]) -------------------------------------------------------------------------------- Clinic Level of Care Assessment Details Patient Name: [name], [name]. Date of Service: 04/18/2017 12:30 PM Medical Record Number: [id number] Patient Account Number: [id number] Date of Birth/Sex: 1966-07-28 (51 y.o. Female) Treating [name]: [name] Primary Care Lainee Lehrman: [name] Other Clinician: Referring Felina Tello: [name] Treating Asa Baudoin/Extender: [name] in Treatment: 12 Clinic Level of Care Assessment Items TOOL 4 Quantity Score []  - Use when only an EandM is performed on FOLLOW-UP visit 0 ASSESSMENTS - Nursing Assessment / Reassessment []  -  Reassessment of Co-morbidities (includes updates in patient status) 0 X - Reassessment of Adherence to Treatment Plan 1 5 ASSESSMENTS - Wound and Skin Assessment / Reassessment []  - Simple Wound Assessment / Reassessment - one wound 0 X - Complex Wound Assessment / Reassessment - multiple wounds 2 5 []  - Dermatologic / Skin Assessment (not related to wound area) 0 ASSESSMENTS - Focused Assessment []  - Circumferential Edema Measurements - multi extremities 0 []  - Nutritional Assessment / Counseling / Intervention 0 []  - Lower Extremity Assessment (monofilament, tuning fork, pulses) 0 []  - Peripheral Arterial Disease Assessment (using hand held doppler) 0 ASSESSMENTS - Ostomy and/or Continence Assessment and Care []  - Incontinence Assessment and Management 0 []  - Ostomy Care Assessment and Management (repouching, etc.) 0 PROCESS - Coordination of Care X - Simple Patient / Family Education for ongoing care 1 15 []  - Complex (extensive) Patient / Family Education for ongoing care 0 []  - Staff obtains Programmer, systems, Records, Test Results / Process Orders 0 []  - Staff telephones HHA, Nursing Homes / Clarify orders / etc 0 []  - Routine Transfer to another Facility (non-emergent condition) 0 [name], [name] ([id number]) []  - Routine Hospital Admission (non-emergent condition) 0 []  - New Admissions / Biomedical engineer / Ordering NPWT, Apligraf, etc. 0 []  - Emergency Hospital Admission (emergent condition) 0 X - Simple Discharge Coordination 1 10 []  - Complex (extensive) Discharge Coordination 0 PROCESS - Special Needs []  - Pediatric / Minor Patient Management 0 []  - Isolation Patient Management 0 []  - Hearing / Language / Visual special needs 0 []  - Assessment of Community assistance (transportation, D/C planning, etc.) 0 []  - Additional assistance / Altered mentation 0 []  - Support Surface(s) Assessment (bed, cushion, seat, etc.) 0 INTERVENTIONS - Wound Cleansing / Measurement []  - Simple  Wound Cleansing - one wound 0 X - Complex Wound Cleansing - multiple wounds 2 5 []  - Wound Imaging (photographs - any number  of wounds) 0 X - Wound Tracing (instead of photographs) 1 5 []  - Simple Wound Measurement - one wound 0 X - Complex Wound Measurement - multiple wounds 2 5 INTERVENTIONS - Wound Dressings []  - Small Wound Dressing one or multiple wounds 0 X - Medium Wound Dressing one or multiple wounds 2 15 []  - Large Wound Dressing one or multiple wounds 0 []  - Application of Medications - topical 0 []  - Application of Medications - injection 0 INTERVENTIONS - Miscellaneous []  - External ear exam 0 [name], [name] ([id number]) []  - Specimen Collection (cultures, biopsies, blood, body fluids, etc.) 0 []  - Specimen(s) / Culture(s) sent or taken to Lab for analysis 0 []  - Patient Transfer (multiple staff / Harrel Lemon Lift / Similar devices) 0 []  - Simple Staple / Suture removal (25 or less) 0 []  - Complex Staple / Suture removal (26 or more) 0 []  - Hypo / Hyperglycemic Management (close monitor of Blood Glucose) 0 []  - Ankle / Brachial Index (ABI) - do not check if billed separately 0 X - Vital Signs 1 5 Has the patient been seen at the hospital within the last three years: Yes Total Score: 100 Level Of Care: New/Established - Level 3 Electronic Signature(s) Signed: 04/20/2017 6:48:20 PM By: [name], [name], [name], CWS, [name], [name] Entered By: [name], [name], [name], CWS, Jamie on 04/18/2017 [name], [name] ([id number]) -------------------------------------------------------------------------------- Encounter Discharge Information Details Patient Name: [name]. Date of Service: 04/18/2017 12:30 PM Medical Record Number: [id number] Patient Account Number: [id number] Date of Birth/Sex: 1966/10/03 (51 y.o. Female) Treating [name]: [name] Primary Care Ravleen Ries: [name] Other Clinician: Referring Flossie Wexler: [name] Treating Jamirra Curnow/Extender: [name] in Treatment:  12 Encounter Discharge Information Items Discharge Pain Level: 0 Discharge Condition: Stable Ambulatory Status: Walker Discharge Destination: Home Transportation: Private Auto Accompanied By: self Schedule Follow-up Appointment: Yes Medication Reconciliation completed Yes and provided to Patient/Care Lindsie Simar: Provided on Clinical Summary of Care: 04/18/2017 Form Type Recipient Paper Patient RM Electronic Signature(s) Signed: 04/18/2017 1:10:25 PM By: Ruthine Dose Entered By: Ruthine Dose on 04/18/2017 13:10:25 Quigley, Cattleya S. ([id number]) -------------------------------------------------------------------------------- Lower Extremity Assessment Details Patient Name: Jamie Martyr. Date of Service: 04/18/2017 12:30 PM Medical Record Number: [id number] Patient Account Number: [id number] Date of Birth/Sex: [birth year] (51 y.o. Female) Treating [name]: [name] Primary Care Lura Falor: [name] Other Clinician: Referring Julie Nay: [name] Treating Marianela Mandrell/Extender: [name] in Treatment: 12 Vascular Assessment Pulses: Dorsalis Pedis Palpable: [Right:Yes] Posterior Tibial Extremity colors, hair growth, and conditions: Extremity Color: [Right:Normal] Hair Growth on Extremity: [Right:Yes] Temperature of Extremity: [Right:Warm] Toe Nail Assessment Left: Right: Thick: No Discolored: No Deformed: No Improper Length and Hygiene: No Electronic Signature(s) Signed: 04/20/2017 6:48:20 PM By: [name], [name], [name], CWS, [name], [name] Entered By: [name], [name], [name], CWS, Jamie on 04/18/2017 12:50:26 [name], [name] ([id number]) -------------------------------------------------------------------------------- Multi Wound Chart Details Patient Name: Jamie Martyr. Date of Service: 04/18/2017 12:30 PM Medical Record Number: [id number] Patient Account Number: [id number] Date of Birth/Sex: Jun 25, 1966 (51 y.o. Female) Treating [name]: [name] Primary Care Albert Hersch: [name] Other  Clinician: Referring Monzerat Handler: [name] Treating Kable Haywood/Extender: [name] in Treatment: 12 Vital Signs Height(in): 67 Pulse(bpm): 102 Weight(lbs): 137 Blood Pressure 81/63 (mmHg): Body Mass Index(BMI): 21 Temperature(F): 97.8 Respiratory Rate 16 (breaths/min): Photos: [1:No Photos] [2:No Photos] [N/A:N/A] Wound Location: [1:Right Malleolus - Lateral] [2:Right Gluteus] [N/A:N/A] Wounding Event: [1:Gradually Appeared] [2:Gradually Appeared] [N/A:N/A] Primary Etiology: [1:Diabetic Wound/Ulcer of the Lower Extremity] [2:Open Surgical Wound] [N/A:N/A] Comorbid  History: [1:Anemia, Arrhythmia, Coronary Artery Disease, Hypotension, Peripheral Arterial Disease, Type II Diabetes, Osteoarthritis] [2:N/A] [N/A:N/A] Date Acquired: [1:09/23/2016] [2:09/18/2016] [N/A:N/A] Weeks of Treatment: [1:12] [2:12] [N/A:N/A] Wound Status: [1:Open] [2:Open] [N/A:N/A] Measurements L x W x D 1.6x1.98x0.3 [2:4x3x2.8] [N/A:N/A] (cm) Area (cm) : [1:2.488] [2:9.425] [N/A:N/A] Volume (cm) : [1:0.746] [1:61.096] [N/A:N/A] % Reduction in Area: [1:36.60%] [2:51.00%] [N/A:N/A] % Reduction in Volume: 62.00% [2:72.50%] [N/A:N/A] Starting Position 1 1 (o'clock): Ending Position 1 [1:1] (o'clock): Maximum Distance 1 1 (cm): Undermining: [1:Yes] [2:N/A] [N/A:N/A] Classification: [1:Grade 2] [2:Full Thickness Without Exposed Support Structures] [N/A:N/A] Exudate Amount: [1:Medium] [2:N/A] [N/A:N/A] Exudate Type: Purulent N/A N/A Exudate Color: yellow, brown, green N/A N/A Wound Margin: Distinct, outline attached N/A N/A Granulation Amount: Small (1-33%) N/A N/A Granulation Quality: Pink N/A N/A Necrotic Amount: Medium (34-66%) N/A N/A Exposed Structures: Fat Layer (Subcutaneous N/A N/A Tissue) Exposed: Yes Fascia: No Tendon: No Muscle: No Joint: No Bone: No Epithelialization: None N/A N/A Periwound Skin Texture: Induration: Yes No Abnormalities Noted N/A Excoriation: No Callus:  No Crepitus: No Rash: No Scarring: No Periwound Skin Maceration: Yes No Abnormalities Noted N/A Moisture: Dry/Scaly: No Periwound Skin Color: Atrophie Blanche: No No Abnormalities Noted N/A Cyanosis: No Ecchymosis: No Erythema: No Hemosiderin Staining: No Mottled: No Pallor: No Rubor: No Temperature: No Abnormality N/A N/A Tenderness on Yes No N/A Palpation: Wound Preparation: Ulcer Cleansing: N/A N/A Rinsed/Irrigated with Saline Topical Anesthetic Applied: Other: lidocaine 4% Treatment Notes Wound #1 (Right, Lateral Malleolus) 1. Cleansed with: Clean wound with Normal Saline 4. Dressing Applied: Prisma Ag 5. Secondary Dressing Applied Bordered Foam Dressing [name], Jamie Harwich. ([id number]) Notes PRIMA, GAUZE, ABD AND TAE TO SECURE ON gLUT. Wound #2 (Right Gluteus) 1. Cleansed with: Clean wound with Normal Saline 4. Dressing Applied: Prisma Ag 5. Secondary Dressing Applied Bordered Foam Dressing Notes PRIMA, GAUZE, ABD AND TAE TO SECURE ON gLUT. Electronic Signature(s) Signed: 04/18/2017 1:36:23 PM By: Christin Fudge MD, FACS Entered By: Christin Fudge on 04/18/2017 13:36:23 [name], [name] ([id number]) -------------------------------------------------------------------------------- [place] [facility] Details Patient Name: [name], [name]. Date of Service: 04/18/2017 12:30 PM Medical Record Number: [id number] Patient Account Number: [id number] Date of Birth/Sex: [birth year] (51 y.o. Female) Treating [name]: [name] Primary Care Kylyn Mcdade: [name] Other Clinician: Referring Yoshi Mancillas: [name] Treating Saavi Mceachron/Extender: [name] in Treatment: 12 Active Inactive ` Orientation to the Wound Care Program Nursing Diagnoses: Knowledge deficit related to the wound healing center program Goals: Patient/caregiver will verbalize understanding of the [place] Date Initiated: 01/23/2017 Target Resolution Date: 05/25/2017 Goal  Status: Active Interventions: Provide education on orientation to the wound center Notes: ` Peripheral Neuropathy Nursing Diagnoses: Knowledge deficit related to disease process and management of peripheral neurovascular dysfunction Potential alteration in peripheral tissue perfusion (select prior to confirmation of diagnosis) Goals: Patient/caregiver will verbalize understanding of disease process and disease management Date Initiated: 01/23/2017 Target Resolution Date: 05/25/2017 Goal Status: Active Interventions: Assess signs and symptoms of neuropathy upon admission and as needed Provide education on Management of Neuropathy and Related Ulcers Provide education on Management of Neuropathy upon discharge from the [facility] Activities: Test ordered outside of clinic : 01/23/2017 Notes: SEE, BEHARRY ([id number]) ` Pressure Nursing Diagnoses: Knowledge deficit related to causes and risk factors for pressure ulcer development Knowledge deficit related to management of pressures ulcers Potential for impaired tissue integrity related to pressure, friction, moisture, and shear Goals: Patient will remain free from development of additional pressure ulcers Date Initiated: 01/23/2017 Target Resolution Date: 05/25/2017 Goal Status:  Active Patient will remain free of pressure ulcers Date Initiated: 01/23/2017 Target Resolution Date: 05/25/2017 Goal Status: Active Patient/caregiver will verbalize risk factors for pressure ulcer development Date Initiated: 01/23/2017 Target Resolution Date: 05/25/2017 Goal Status: Active Patient/caregiver will verbalize understanding of pressure ulcer management Date Initiated: 01/23/2017 Target Resolution Date: 05/25/2017 Goal Status: Active Interventions: Assess: immobility, friction, shearing, incontinence upon admission and as needed Assess offloading mechanisms upon admission and as needed Assess potential for pressure ulcer upon admission and as  needed Provide education on pressure ulcers Treatment Activities: Patient referred for pressure reduction/relief devices : 01/23/2017 Patient referred for seating evaluation to ensure proper offloading : 01/23/2017 Pressure reduction/relief device ordered : 01/23/2017 Notes: ` Wound/Skin Impairment Nursing Diagnoses: Impaired tissue integrity Knowledge deficit related to smoking impact on wound healing Knowledge deficit related to ulceration/compromised skin integrity Goals: Patient/caregiver will verbalize understanding of skin care regimen [name], [name] ([id number]) Date Initiated: 01/23/2017 Target Resolution Date: 05/25/2017 Goal Status: Active Ulcer/skin breakdown will have a volume reduction of 30% by week 4 Date Initiated: 01/23/2017 Target Resolution Date: 05/25/2017 Goal Status: Active Ulcer/skin breakdown will have a volume reduction of 50% by week 8 Date Initiated: 01/23/2017 Target Resolution Date: 05/25/2017 Goal Status: Active Ulcer/skin breakdown will have a volume reduction of 80% by week 12 Date Initiated: 01/23/2017 Target Resolution Date: 05/25/2017 Goal Status: Active Ulcer/skin breakdown will heal within 14 weeks Date Initiated: 01/23/2017 Target Resolution Date: 05/25/2017 Goal Status: Active Interventions: Assess patient/caregiver ability to obtain necessary supplies Assess patient/caregiver ability to perform ulcer/skin care regimen upon admission and as needed Assess ulceration(s) every visit Provide education on smoking Provide education on ulcer and skin care Treatment Activities: Patient referred to home care : 01/23/2017 Referred to DME [facility] for dressing supplies : 01/23/2017 Skin care regimen initiated : 01/23/2017 Topical wound management initiated : 01/23/2017 Notes: Electronic Signature(s) Signed: 04/20/2017 6:48:20 PM By: [name], [name], [name], CWS, [name], [name] Entered By: [name], [name], [name], CWS, Jamie on 04/18/2017 12:50:34 [name], [name]  ([id number]) -------------------------------------------------------------------------------- Pain Assessment Details Patient Name: [name], [name]. Date of Service: 04/18/2017 12:30 PM Medical Record Number: [id number] Patient Account Number: [id number] Date of Birth/Sex: Mar 04, 1966 (51 y.o. Female) Treating [name]: [name] Primary Care Drayk Humbarger: [name] Other Clinician: Referring Arcangel Minion: [name] Treating Josilynn Losh/Extender: [name] in Treatment: 12 Active Problems Location of Pain Severity and Description of Pain Patient Has Paino Yes Site Locations Pain Location: Generalized Pain Rate the pain. Current Pain Level: 10 Pain Management and Medication Current Pain Management: Electronic Signature(s) Signed: 04/20/2017 6:48:20 PM By: [name], [name], [name], CWS, [name], [name] Entered By: [name], [name], [name], CWS, Jamie on 04/18/2017 12:38:42 Kendrick, [name] ([id number]) -------------------------------------------------------------------------------- Wound Assessment Details Patient Name: [name], [name]. Date of Service: 04/18/2017 12:30 PM Medical Record Number: [id number] Patient Account Number: [id number] Date of Birth/Sex: 11-18-1966 (51 y.o. Female) Treating [name]: [name] Primary Care Navarre Diana: [name] Other Clinician: Referring Vyolet Sakuma: [name] Treating Lahoma Constantin/Extender: [name] in Treatment: 12 Wound Status Wound Number: 1 Primary Diabetic Wound/Ulcer of the Lower Etiology: Extremity Wound Location: Right Malleolus - Lateral Wound Open Wounding Event: Gradually Appeared Status: Date Acquired: 09/23/2016 Comorbid Anemia, Arrhythmia, Coronary Artery Weeks Of Treatment: 12 History: Disease, Hypotension, Peripheral Clustered Wound: No Arterial Disease, Type II Diabetes, Osteoarthritis Photos Photo Uploaded By: [name], [name], [name], CWS, Jamie on 04/18/2017 13:44:03 Wound Measurements Length: (cm) 1.6 Width: (cm) 1.98 Depth: (cm) 0.3 Area: (cm)  2.488 Volume: (cm) 0.746 % Reduction in Area: 36.6% % Reduction  in Volume: 62% Epithelialization: None Tunneling: No Undermining: Yes Starting Position (o'clock): 1 Ending Position (o'clock): 1 Maximum Distance: (cm) 1 Wound Description Classification: Grade 2 Foul Odor Aft Wound Margin: Distinct, outline attached Slough/Fibrin Exudate Amount: Medium Exudate Type: Purulent Exudate Color: yellow, brown, green er Cleansing: No o Yes Wound Bed Granulation Amount: Small (1-33%) Exposed Structure Soderquist, Francyne S. ([id number]) Granulation Quality: Pink Fascia Exposed: No Necrotic Amount: Medium (34-66%) Fat Layer (Subcutaneous Tissue) Exposed: Yes Necrotic Quality: Adherent Slough Tendon Exposed: No Muscle Exposed: No Joint Exposed: No Bone Exposed: No Periwound Skin Texture Texture Color No Abnormalities Noted: No No Abnormalities Noted: No Callus: No Atrophie Blanche: No Crepitus: No Cyanosis: No Excoriation: No Ecchymosis: No Induration: Yes Erythema: No Rash: No Hemosiderin Staining: No Scarring: No Mottled: No Pallor: No Moisture Rubor: No No Abnormalities Noted: No Dry / Scaly: No Temperature / Pain Maceration: Yes Temperature: No Abnormality Tenderness on Palpation: Yes Wound Preparation Ulcer Cleansing: Rinsed/Irrigated with Saline Topical Anesthetic Applied: Other: lidocaine 4%, Treatment Notes Wound #1 (Right, Lateral Malleolus) 1. Cleansed with: Clean wound with Normal Saline 4. Dressing Applied: Prisma Ag 5. Secondary Dressing Applied Bordered Foam Dressing Notes PRIMA, GAUZE, ABD AND TAE TO SECURE ON gLUT. Electronic Signature(s) Signed: 04/20/2017 6:48:20 PM By: [name], [name], [name], CWS, [name], [name] Entered By: [name], [name], [name], CWS, Jamie on 04/18/2017 12:48:52 [place], [name] ([id number]) -------------------------------------------------------------------------------- Wound Assessment Details Patient Name: [name], [name]. Date of Service:  04/18/2017 12:30 PM Medical Record Number: [id number] Patient Account Number: [id number] Date of Birth/Sex: 01-14-1966 (51 y.o. Female) Treating [name]: [name] Primary Care Wilver Tignor: [name] Other Clinician: Referring Natan Hartog: [name] Treating Aseem Sessums/Extender: [name] in Treatment: 12 Wound Status Wound Number: 2 Primary Etiology: Open Surgical Wound Wound Location: Right Gluteus Wound Status: Open Wounding Event: Gradually Appeared Date Acquired: 09/18/2016 Weeks Of Treatment: 12 Clustered Wound: No Photos Photo Uploaded By: [name], [name], [name], CWS, Jamie on 04/18/2017 13:44:03 Wound Measurements Length: (cm) 4 Width: (cm) 3 Depth: (cm) 2.8 Area: (cm) 9.425 Volume: (cm) 26.389 % Reduction in Area: 51% % Reduction in Volume: 72.5% Wound Description Full Thickness Without Exposed Classification: Support Structures Periwound Skin Texture Texture Color No Abnormalities Noted: No No Abnormalities Noted: No Moisture No Abnormalities Noted: No Treatment Notes Wound #2 (Right Gluteus) 1. Cleansed with: Clean wound with Normal Saline Gora, Shaelynn S. ([id number]) 4. Dressing Applied: Prisma Ag 5. Secondary Dressing Applied Bordered Foam Dressing Notes PRIMA, GAUZE, ABD AND TAE TO SECURE ON gLUT. Electronic Signature(s) Signed: 04/20/2017 6:48:20 PM By: [name], [name], [name], CWS, [name], [name] Entered By: [name], [name], [name], CWS, Jamie on 04/18/2017 12:49:30 Scarlett, [name] ([id number]) -------------------------------------------------------------------------------- Vitals Details Patient Name: AILENE, ROYAL. Date of Service: 04/18/2017 12:30 PM Medical Record Number: [id number] Patient Account Number: [id number] Date of Birth/Sex: 06-May-1966 (51 y.o. Female) Treating [name]: [name] Primary Care Patriciaann Rabanal: [name] Other Clinician: Referring Baley Lorimer: [name] Treating Shanik Brookshire/Extender: [name] in Treatment: 12 Vital Signs Time Taken:  12:41 Temperature (F): 97.8 Height (in): 67 Pulse (bpm): 102 Weight (lbs): 137 Respiratory Rate (breaths/min): 16 Body Mass Index (BMI): 21.5 Blood Pressure (mmHg): 81/63 Reference Range: 80 - 120 mg / dl Electronic Signature(s) Signed: 04/20/2017 6:48:20 PM By: [name], [name], [name], CWS, [name], [name] Entered By: [name], [name], [name], CWS, Jamie on 04/18/2017 12:42:27

## 2017-04-21 NOTE — Progress Notes (Signed)
Jamie, [name] ([id number]) Visit Report for 04/18/2017 Chief Complaint Document Details Patient Name: [name], [name]. Date of Service: 04/18/2017 12:30 PM Medical Record Number: [id number] Patient Account Number: [id number] Date of Birth/Sex: 29-Jan-1966 (51 y.o. Female) Treating RN: Cornell Barman Primary Care Provider: Delight Stare Other Clinician: Referring Provider: Delight Stare Treating Provider/Extender: Frann Rider in Treatment: 12 Information Obtained from: Patient Chief Complaint Patients presents for treatment of an open diabetic ulcer with an arterial etiology on her right lateral ankle and right gluteal area for about 4 months Electronic Signature(s) Signed: 04/18/2017 1:36:30 PM By: Christin Fudge MD, FACS Entered By: Christin Fudge on 04/18/2017 13:36:30 [name], Jamie Saxon ([id number]) -------------------------------------------------------------------------------- HPI Details Patient Name: [name]. Date of Service: 04/18/2017 12:30 PM Medical Record Number: [id number] Patient Account Number: [id number] Date of Birth/Sex: 10-09-1966 (51 y.o. Female) Treating RN: Cornell Barman Primary Care Provider: Delight Stare Other Clinician: Referring Provider: Delight Stare Treating Provider/Extender: Frann Rider in Treatment: 12 History of Present Illness Location: right lateral ankle and right gluteal area Quality: Patient reports experiencing a sharp pain to affected area(s). Severity: Patient states wound are getting worse. Duration: Patient has had the wound for > 4 months prior to seeking treatment at the wound center Timing: Pain in wound is constant (hurts all the time) Context: The wound occurred when the patient was in hospital with a necrotizing fasciitis of the right gluteal area and a ulceration on the right ankle Modifying Factors: Other treatment(s) tried include:admitted to the hospital for IV antibiotics and a full workup and has also had a recent  angiogram Associated Signs and Symptoms: Patient reports having increase discharge. HPI Description: 51 year old patient was sent to [place] from [facility] where she was seen by Dr. Sherril Cong for a left ankle ulceration and was recently hospitalized with hypotension and sepsis. She was treated with IV antibiotics and has been scheduled to see Dr. Sharol Given. She was seen by vascular surgery who recommended a femoral bypass but surgery has been delayed until her sacral wound from last year's necrotizing fasciitis has healed. Was seeing the wound care team at [facility] but wanted to change over. She is a smoker and smokes a pack of cigarettes a day The patient was recently admitted in [place] between February 2 and February 14. She had a follow- up to see vascular surgery, orthopedic surgery and infectious disease. 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 Review of the electronic medical records reveals that Dr. 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 An MRI showed edema in the bone which was consistent with osteomyelitis and the chronic ulcer and may need surgical intervention. She also had a sacral decubitus ulcer which was treated with the wound VAC. On 01/07/2017 she was taken up by the vascular surgeon Dr. Trula Slade for an abdominal aortogram and bilateral lower extremity runoff, for a history of having bilateral femoropopliteal bypass graft as well as external iliac stenting on the right and stenting of her bypass graft. She had developed a nonhealing  wound on her right ankle and there was a possibility of a femoral occlusion. the findings were  noted and the impression was a surgical revascularization with a aorto bifemoral bypass graft. Both the femoropopliteal bypass grafts were patent. 01/31/2017 -- x-ray of the right hip and pelvis -- IMPRESSION:No radiographic evidence of acute osteomyelitis. Normal-appearing right hip joint space for age. No acute bony abnormality of the hip. 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 Since seeing her last week she has not had an appointment with infectious disease or the orthopedic specialist yet. Jamie, [name] ([id number]) 02/06/2017 -- the patient missed a couple of appointments yesterday due to the weather but other than that has apparently given up smoking for the last 5 days. 02/13/2017 -- she has rescheduled her infectious disease appointment and also the appointment with the orthopedic surgeon Dr. Sharol Given 03/06/2017 -- she was seen by Dr. Lucianne Lei dam of infectious disease on 03/05/2017 and he recommendedIV antibiotics but the patient did not want to have that and he has given her Augmentin and doxycycline and will reevaluate her in 2 months time. 03/13/2017 -- he saw Dr. Trula Slade regarding her vascular issues and he would like to wait to the sacral wound is completely closed before considering aortobifemoral bypass graft. He will see her back in 2 months time. She was also seen by Dr. Sharol Given of orthopedics who recommended continue wound care dressings and follow in the office in about 2 months time. he also recommended 3 view radiographs of the right ankle at follow-up. 03/28/2017 -- she did have a PICC line placed and Dr. Lucianne Lei dam has begun IV vancomycin and ceftriaxone. she is going to continue this until she sees him in approximately a month's time 04/18/2017 -- we had applied for a skin substitute for the patient's care but her copayment  is going to be about $300 a piece and the patient will not be able to afford this. Electronic Signature(s) Signed: 04/18/2017 1:37:03 PM By: Christin Fudge MD, FACS Entered By: Christin Fudge on 04/18/2017 13:37:02 [place], Jamie Saxon ([id number]) -------------------------------------------------------------------------------- Physical Exam Details Patient Name: [name], [name] Date of Service: 04/18/2017 12:30 PM Medical Record Number: [id number] Patient Account Number: [id number] Date of Birth/Sex: 13-May-1966 (51 y.o. Female) Treating RN: Cornell Barman Primary Care Provider: Delight Stare Other Clinician: Referring Provider: Delight Stare Treating Provider/Extender: Frann Rider in Treatment: 12 Constitutional . Pulse regular. Respirations normal and unlabored. Afebrile. . Eyes Nonicteric. Reactive to light. Ears, Nose, Mouth, and Throat Lips, teeth, and gums WNL.Marland Kitchen Moist mucosa without lesions. Neck supple and nontender. No palpable supraclavicular or cervical adenopathy. Normal sized without goiter. Respiratory WNL. No retractions.. Breath sounds WNL, No rubs, rales, rhonchi, or wheeze.. Cardiovascular Heart rhythm and rate regular, no murmur or gallop.. Pedal Pulses WNL. No clubbing, cyanosis or edema. Chest Breasts symmetical and no nipple discharge.. Breast tissue WNL, no masses, lumps, or tenderness.. Lymphatic No adneopathy. No adenopathy. No adenopathy. Musculoskeletal Adexa without tenderness or enlargement.. Digits and nails w/o clubbing, cyanosis, infection, petechiae, ischemia, or inflammatory conditions.. Integumentary (Hair, Skin) No suspicious lesions. No crepitus or fluctuance. No peri-wound warmth or erythema. No masses.Marland Kitchen Psychiatric Judgement and insight Intact.. No evidence of depression, anxiety, or agitation.. Notes the right ankle wound had some subcutaneous debris which was washed out with moist saline gauze and with a Q-tip I was able to go around the edges  of the wound and irrigated. The right gluteal wound has a bit of macerated at the edges and the hyper granulation tissue has gone down nicely.  Electronic Signature(s) Signed: 04/18/2017 1:37:55 PM By: Christin Fudge MD, FACS Entered By: Christin Fudge on 04/18/2017 13:37:54 [place], Jamie Saxon ([id number]) -------------------------------------------------------------------------------- Physician Orders Details Patient Name: [name], BARRETTO. Date of Service: 04/18/2017 12:30 PM Medical Record Number: [id number] Patient Account Number: [id number] Date of Birth/Sex: 1966-03-10 (51 y.o. Female) Treating RN: Cornell Barman Primary Care Provider: Delight Stare Other Clinician: Referring Provider: Delight Stare Treating Provider/Extender: Frann Rider in Treatment: 12 Verbal / Phone Orders: No Diagnosis Coding Wound Cleansing Wound #1 Right,Lateral Malleolus o Clean wound with Normal Saline. Wound #2 Right Gluteus o Clean wound with Normal Saline. Anesthetic Wound #1 Right,Lateral Malleolus o Topical Lidocaine 4% cream applied to wound bed prior to debridement Wound #2 Right Gluteus o Topical Lidocaine 4% cream applied to wound bed prior to debridement Skin Barriers/Peri-Wound Care Wound #1 Right,Lateral Malleolus o Skin Prep - in clinic Wound #2 Right Gluteus o Skin Prep - in clinic Primary Wound Dressing Wound #1 Right,Lateral Malleolus o Prisma Ag - please pack into the undermining's. Wound #2 Right Gluteus o Prisma Ag - please pack into the undermining's. Secondary Dressing Wound #2 Right Gluteus o Dry Gauze o Boardered Foam Dressing - in clinic until [facility] applies VAC Wound #1 Right,Lateral Malleolus o Dry Gauze o Boardered Foam Dressing [name], [place]. ([id number]) Dressing Change Frequency Wound #1 Right,Lateral Malleolus o Change dressing every day. Wound #2 Right Gluteus o Change Dressing Monday, Wednesday, Friday Follow-up Appointments Wound #1  Right,Lateral Malleolus o Return Appointment in 1 week. Wound #2 Right Gluteus o Return Appointment in 1 week. Additional Orders / Instructions Wound #1 Right,Lateral Malleolus o Stop Smoking o Increase protein intake. o Activity as tolerated o Other: - Please add vitamin A, vitamin C and zinc supplements to your diet Wound #2 Right Gluteus o Stop Smoking o Increase protein intake. o Activity as tolerated o Other: - Please add vitamin A, vitamin C and zinc supplements to your diet Home Health Wound #1 [place] Visits - [place] Nurse may visit PRN to address patientos wound care needs. o FACE TO FACE ENCOUNTER: MEDICARE and MEDICAID PATIENTS: I certify that this patient is under my care and that I had a face-to-face encounter that meets the physician face-to-face encounter requirements with this patient on this date. The encounter with the patient was in whole or in part for the following MEDICAL CONDITION: (primary reason for [place]) MEDICAL NECESSITY: I certify, that based on my findings, NURSING services are a medically necessary home health service. HOME BOUND STATUS: I certify that my clinical findings support that this patient is homebound (i.e., Due to illness or injury, pt requires aid of supportive devices such as crutches, cane, wheelchairs, walkers, the use of special transportation or the assistance of another person to leave their place of residence. There is a normal inability to leave the home and doing so requires considerable and taxing effort. Other absences are for medical reasons / religious services and are infrequent or of short duration when for other reasons). o If current dressing causes regression in wound condition, may D/C ordered dressing product/s and apply Normal Saline Moist Dressing daily until next [place] / Other MD appointment. [place] of regression in wound condition at [phone]. JAMISE, [place] ([id number]) o Please direct any NON-WOUND related issues/requests for orders to patient's Primary Care Physician Wound #2 Right [place] Visits - [place]  [facility] Nurse may visit PRN to address patientos wound care needs. o FACE TO FACE ENCOUNTER: MEDICARE and MEDICAID PATIENTS: I certify that this patient is under my care and that I had a face-to-face encounter that meets the physician face-to-face encounter requirements with this patient on this date. The encounter with the patient was in whole or in part for the following MEDICAL CONDITION: (primary reason for [place]) MEDICAL NECESSITY: I certify, that based on my findings, NURSING services are a medically necessary home health service. HOME BOUND STATUS: I certify that my clinical findings support that this patient is homebound (i.e., Due to illness or injury, pt requires aid of supportive devices such as crutches, cane, wheelchairs, walkers, the use of special transportation or the assistance of another person to leave their place of residence. There is a normal inability to leave the home and doing so requires considerable and taxing effort. Other absences are for medical reasons / religious services and are infrequent or of short duration when for other reasons). o If current dressing causes regression in wound condition, may D/C ordered dressing product/s and apply Normal Saline Moist Dressing daily until next [place] / Other MD appointment. [place] of regression in wound condition at [phone]. o Please direct any NON-WOUND related issues/requests for orders to patient's Primary Care Physician Negative Pressure Wound Therapy Wound #2 Right Gluteus o Wound VAC settings at 125/130 mmHg continuous pressure. Use BLACK/GREEN foam to wound cavity. Use  WHITE foam to fill any tunnel/s and/or undermining. Change VAC dressing 3 X WEEK. Change canister as indicated when full. Nurse may titrate settings and frequency of dressing changes as clinically indicated. - black foam to wound cavity only; BRIDGE TO RIGHT HIP. Electronic Signature(s) Signed: 04/18/2017 4:12:51 PM By: Christin Fudge MD, FACS Signed: 04/20/2017 6:48:20 PM By: Gretta Cool, [name], RN, CWS, [name], [name] Entered By: Gretta Cool, [name], RN, CWS, Kim on 04/18/2017 13:51:51 [place], Jamie Saxon ([id number]) -------------------------------------------------------------------------------- Problem List Details Patient Name: [name], FUQUAY. Date of Service: 04/18/2017 12:30 PM Medical Record Number: [id number] Patient Account Number: [id number] Date of Birth/Sex: Mar 20, 1966 (51 y.o. Female) Treating RN: Cornell Barman Primary Care Provider: Delight Stare Other Clinician: Referring Provider: Delight Stare Treating Provider/Extender: Frann Rider in Treatment: 12 Active Problems ICD-10 Encounter Code Description Active Date Diagnosis E11.621 Type 2 diabetes mellitus with foot ulcer 01/23/2017 Yes L89.314 Pressure ulcer of right buttock, stage 4 01/23/2017 Yes L97.312 Non-pressure chronic ulcer of right ankle with fat layer 01/23/2017 Yes exposed F17.218 Nicotine dependence, cigarettes, with other nicotine- 01/23/2017 Yes induced disorders I70.233 Atherosclerosis of native arteries of right leg with 01/23/2017 Yes ulceration of ankle M86.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, right ankle and foot 01/23/2017 Yes Inactive Problems Resolved Problems Electronic Signature(s) Signed: 04/18/2017 1:36:17 PM By: Christin Fudge MD, FACS Entered By: Christin Fudge on 04/18/2017 13:36:17 [place], [place]. ([id number]) -------------------------------------------------------------------------------- Progress Note Details Patient Name: [name]. Date of Service: 04/18/2017 12:30 PM Medical Record Number: [id number] Patient  Account Number: [id number] Date of Birth/Sex: July 17, 1966 (51 y.o. Female) Treating RN: Cornell Barman Primary Care Provider: Delight Stare Other Clinician: Referring Provider: Delight Stare Treating Provider/Extender: Frann Rider in Treatment: 12 Subjective Chief Complaint Information obtained from Patient Patients presents for treatment of an open diabetic ulcer with an arterial etiology on her right lateral ankle and right gluteal area for about 4 months History of Present Illness (HPI) The following HPI elements were documented for the patient's wound: Location: right lateral ankle and right gluteal  area Quality: Patient reports experiencing a sharp pain to affected area(s). Severity: Patient states wound are getting worse. Duration: Patient has had the wound for > 4 months prior to seeking treatment at the wound center Timing: Pain in wound is constant (hurts all the time) Context: The wound occurred when the patient was in hospital with a necrotizing fasciitis of the right gluteal area and a ulceration on the right ankle Modifying Factors: Other treatment(s) tried include:admitted to the hospital for IV antibiotics and a full workup and has also had a recent angiogram Associated Signs and Symptoms: Patient reports having increase discharge. 51 year old patient was sent to [place] from [facility] where she was seen by Dr. Sherril Cong for a left ankle ulceration and was recently hospitalized with hypotension and sepsis. She was treated with IV antibiotics and has been scheduled to see Dr. Sharol Given. She was seen by vascular surgery who recommended a femoral bypass but surgery has been delayed until her sacral wound from last year's necrotizing fasciitis has healed. Was seeing the wound care team at [facility] but wanted to change over. She is a smoker and smokes a pack of cigarettes a day The patient was recently admitted in [place] between February 2 and February 14. She had a  follow- up to see vascular surgery, orthopedic surgery and infectious disease. 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 Review of the electronic medical records reveals that Dr. 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 An MRI showed edema in the bone which was consistent with osteomyelitis and the chronic ulcer and may need surgical intervention. She also had a sacral decubitus ulcer which was treated with the wound VAC. On 01/07/2017 she was taken up by the vascular surgeon Dr. Trula Slade for an abdominal aortogram and bilateral lower extremity runoff, for a history of having bilateral femoropopliteal bypass graft as well as external iliac stenting on the right and stenting of her bypass graft. She had developed a nonhealing wound on her right ankle and there was a possibility of a femoral occlusion. the findings were noted and the [name], [place]. ([id number]) impression was a surgical revascularization with a aorto bifemoral bypass graft. Both the femoropopliteal bypass grafts were patent. 01/31/2017 -- x-ray of the right hip and pelvis -- IMPRESSION:No radiographic evidence of acute osteomyelitis. Normal-appearing right hip joint space for age. No acute bony abnormality of the hip. 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 Since seeing her last week she has not had an appointment with infectious disease or the orthopedic specialist yet. 02/06/2017 -- the patient missed a couple of appointments yesterday  due to the weather but other than that has apparently given up smoking for the last 5 days. 02/13/2017 -- she has rescheduled her infectious disease appointment and also the appointment with the orthopedic surgeon Dr. Sharol Given 03/06/2017 -- she was seen by Dr. Lucianne Lei dam of infectious disease on 03/05/2017 and he recommendedIV antibiotics but the patient did not want to have that and he has given her Augmentin and doxycycline and will reevaluate her in  2 months time. 03/13/2017 -- he saw Dr. Trula Slade regarding her vascular issues and he would like to wait to the sacral wound is completely closed before considering aortobifemoral bypass graft. He will see her back in 2 months time. She was also seen by Dr. Sharol Given of orthopedics who recommended continue wound care dressings and follow in the office in about 2 months time. he also recommended 3 view radiographs of the right ankle at follow-up. 03/28/2017 -- she did have a PICC line placed and Dr. Lucianne Lei dam has begun IV vancomycin and ceftriaxone. she is going to continue this until she sees him in approximately a month's time 04/18/2017 -- we had applied for a skin substitute for the patient's care but her copayment is going to be about $300 a piece and the patient will not be able to afford this. Objective Constitutional Pulse regular. Respirations normal and unlabored. Afebrile. Vitals Time Taken: 12:41 PM, Height: 67 in, Weight: 137 lbs, BMI: 21.5, Temperature: 97.8 F, Pulse: 102 bpm, Respiratory Rate: 16 breaths/min, Blood Pressure: 81/63 mmHg. Eyes Nonicteric. Reactive to light. Ears, Nose, Mouth, and Throat Lips, teeth, and gums WNL.Marland Kitchen Moist mucosa without lesions. Neck supple and nontender. No palpable supraclavicular or cervical adenopathy. Normal sized without goiter. [name], [place]. ([id number]) Respiratory WNL. No retractions.. Breath sounds WNL, No rubs, rales, rhonchi, or wheeze.. Cardiovascular Heart rhythm and rate regular, no murmur  or gallop.. Pedal Pulses WNL. No clubbing, cyanosis or edema. Chest Breasts symmetical and no nipple discharge.. Breast tissue WNL, no masses, lumps, or tenderness.. Lymphatic No adneopathy. No adenopathy. No adenopathy. Musculoskeletal Adexa without tenderness or enlargement.. Digits and nails w/o clubbing, cyanosis, infection, petechiae, ischemia, or inflammatory conditions.Marland Kitchen Psychiatric Judgement and insight Intact.. No evidence of depression, anxiety, or agitation.. General Notes: the right ankle wound had some subcutaneous debris which was washed out with moist saline gauze and with a Q-tip I was able to go around the edges of the wound and irrigated. The right gluteal wound has a bit of macerated at the edges and the hyper granulation tissue has gone down nicely. Integumentary (Hair, Skin) No suspicious lesions. No crepitus or fluctuance. No peri-wound warmth or erythema. No masses.. Wound #1 status is Open. Original cause of wound was Gradually Appeared. The wound is located on the Right,Lateral Malleolus. The wound measures 1.6cm length x 1.98cm width x 0.3cm depth; 2.488cm^2 area and 0.746cm^3 volume. There is Fat Layer (Subcutaneous Tissue) Exposed exposed. There is no tunneling noted, however, there is undermining starting at 1:00 and ending at 1:00 with a maximum distance of 1cm. There is a medium amount of purulent drainage noted. The wound margin is distinct with the outline attached to the wound base. There is small (1-33%) pink granulation within the wound bed. There is a medium (34-66%) amount of necrotic tissue within the wound bed including Adherent Slough. The periwound skin appearance exhibited: Induration, Maceration. The periwound skin appearance did not exhibit: Callus, Crepitus, Excoriation, Rash, Scarring, Dry/Scaly, Atrophie Blanche, Cyanosis, Ecchymosis, Hemosiderin Staining, Mottled, Pallor, Rubor, Erythema. Periwound temperature was noted as No Abnormality. The  periwound has tenderness on palpation. Wound #2 status is Open. Original cause of wound was Gradually Appeared. The wound is located on the Right Gluteus. The wound measures 4cm length x 3cm width x 2.8cm depth; 9.425cm^2 area and 26.389cm^3 volume. Assessment Active Problems [name], [place]. ([id number]) ICD-10 E11.621 - Type 2 diabetes mellitus with foot ulcer L89.314 - Pressure ulcer of right buttock, stage 4 L97.312 - Non-pressure chronic ulcer  of right ankle with fat layer exposed F17.218 - Nicotine dependence, cigarettes, with other nicotine-induced disorders I70.233 - Atherosclerosis of native arteries of right leg with ulceration of ankle M86.371 - Chronic multifocal osteomyelitis, right ankle and foot Plan Wound Cleansing: Wound #1 Right,Lateral Malleolus: Clean wound with Normal Saline. Wound #2 Right Gluteus: Clean wound with Normal Saline. Anesthetic: Wound #1 Right,Lateral Malleolus: Topical Lidocaine 4% cream applied to wound bed prior to debridement Wound #2 Right Gluteus: Topical Lidocaine 4% cream applied to wound bed prior to debridement Skin Barriers/Peri-Wound Care: Wound #1 Right,Lateral Malleolus: Skin Prep - in clinic Wound #2 Right Gluteus: Skin Prep - in clinic Primary Wound Dressing: Wound #1 Right,Lateral Malleolus: Prisma Ag - please pack into the undermining's. Wound #2 Right Gluteus: Prisma Ag - please pack into the undermining's. Secondary Dressing: Wound #2 Right Gluteus: Dry Gauze Boardered Foam Dressing - in clinic until [facility] applies VAC Wound #1 Right,Lateral Malleolus: Dry Gauze Boardered Foam Dressing Dressing Change Frequency: Wound #1 Right,Lateral Malleolus: Change dressing every day. Wound #2 Right Gluteus: Change Dressing Monday, Wednesday, Friday Follow-up Appointments: Wound #1 Right,Lateral Malleolus: Return Appointment in 1 week. [name], [name] ([id number]) Wound #2 Right Gluteus: Return Appointment in 1  week. Additional Orders / Instructions: Wound #1 Right,Lateral Malleolus: Stop Smoking Increase protein intake. Activity as tolerated Other: - Please add vitamin A, vitamin C and zinc supplements to your diet Wound #2 Right Gluteus: Stop Smoking Increase protein intake. Activity as tolerated Other: - Please add vitamin A, vitamin C and zinc supplements to your diet Home Health: Wound #1 Right,Lateral Malleolus: [place] Visits - [place] Nurse may visit PRN to address patient s wound care needs. FACE TO FACE ENCOUNTER: MEDICARE and MEDICAID PATIENTS: I certify that this patient is under my care and that I had a face-to-face encounter that meets the physician face-to-face encounter requirements with this patient on this date. The encounter with the patient was in whole or in part for the following MEDICAL CONDITION: (primary reason for [place]) MEDICAL NECESSITY: I certify, that based on my findings, NURSING services are a medically necessary home health service. HOME BOUND STATUS: I certify that my clinical findings support that this patient is homebound (i.e., Due to illness or injury, pt requires aid of supportive devices such as crutches, cane, wheelchairs, walkers, the use of special transportation or the assistance of another person to leave their place of residence. There is a normal inability to leave the home and doing so requires considerable and taxing effort. Other absences are for medical reasons / religious services and are infrequent or of short duration when for other reasons). If current dressing causes regression in wound condition, may D/C ordered dressing product/s and apply Normal Saline Moist Dressing daily until next [place] / Other MD appointment. [place] of regression in wound condition at [phone]. Please direct any NON-WOUND related issues/requests for orders to patient's Primary  Care Physician Wound #2 Right Gluteus: [place] Nurse may visit PRN to address patient s wound care needs. FACE TO FACE ENCOUNTER: MEDICARE and MEDICAID PATIENTS: I certify that this patient is under my care and that I had a face-to-face encounter that meets the physician face-to-face encounter requirements with this patient on this date. The encounter with the patient was in whole or in part for the following MEDICAL CONDITION: (primary reason for [place]) MEDICAL NECESSITY: I certify, that based on  my findings, NURSING services are a medically necessary home health service. HOME BOUND STATUS: I certify that my clinical findings support that this patient is homebound (i.e., Due to illness or injury, pt requires aid of supportive devices such as crutches, cane, wheelchairs, walkers, the use of special transportation or the assistance of another person to leave their place of residence. There is a normal inability to leave the home and doing so requires considerable and taxing effort. Other absences are for medical reasons / religious services and are infrequent or of short duration when for other reasons). If current dressing causes regression in wound condition, may D/C ordered dressing product/s and apply Normal Saline Moist Dressing daily until next [place] / Other MD appointment. [place] of regression in wound condition at [phone]. Please direct any NON-WOUND related issues/requests for orders to patient's Primary Care Physician Negative Pressure Wound Therapy: Wound #2 Right Gluteus: Wound VAC settings at 125/130 mmHg continuous pressure. Use BLACK/GREEN foam to wound cavity. Volkman, [name] ([id number]) Use WHITE foam to fill any tunnel/s and/or undermining. Change VAC dressing 3 X WEEK. Change canister as indicated when full. Nurse may titrate settings and frequency of dressing changes as  clinically indicated. - black foam to wound cavity only; BRIDGE TO RIGHT HIP. After review I have recommended: 1. Silver collagen to be applied to the right ankle, change daily 2. Silva collagen under the black foam for the wound VAC, to the right gluteal region to be changed 3 times a week. 3. adequate protein, vitamin A, vitamin C and zinc 4. 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 ([id number]) -------------------------------------------------------------------------------- SuperBill Details Patient Name: [name]. Date of Service: 04/18/2017 Medical Record Number: [id number] Patient Account Number: [id number] Date of Birth/Sex: April 21, 1966 (51 y.o. 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

## 2017-04-22 ENCOUNTER — Telehealth: Payer: Self-pay | Admitting: Cardiology

## 2017-04-22 NOTE — Telephone Encounter (Signed)
Lmov for patient to call back and schedule 63m follow up  She is former patient of Dr Yvone Neu

## 2017-04-22 NOTE — Telephone Encounter (Signed)
-----   Message from Valora Corporal, RN sent at 04/22/2017  1:14 PM EDT ----- Regarding: Appt Patient needs 3 month follow up from last office visit. (?[place])

## 2017-04-23 ENCOUNTER — Other Ambulatory Visit (HOSPITAL_COMMUNITY)
Admission: RE | Admit: 2017-04-23 | Discharge: 2017-04-23 | Disposition: A | Discharge status: Home / Self Care | Payer: Medicare Other | Source: Other Acute Inpatient Hospital | Attending: Infectious Disease | Admitting: Infectious Disease

## 2017-04-23 DIAGNOSIS — Z792 Long term (current) use of antibiotics: Secondary | ICD-10-CM | POA: Diagnosis present

## 2017-04-23 DIAGNOSIS — Z5181 Encounter for therapeutic drug level monitoring: Secondary | ICD-10-CM | POA: Insufficient documentation

## 2017-04-23 LAB — BASIC METABOLIC PANEL
ANION GAP: 8 (ref 5–15)
BUN: 19 mg/dL (ref 6–20)
CO2: 27 mmol/L (ref 22–32)
Calcium: 9 mg/dL (ref 8.9–10.3)
Chloride: 100 mmol/L — ABNORMAL LOW (ref 101–111)
Creatinine, Ser: 0.98 mg/dL (ref 0.44–1.00)
Glucose, Bld: 213 mg/dL — ABNORMAL HIGH (ref 65–99)
POTASSIUM: 3.9 mmol/L (ref 3.5–5.1)
Sodium: 135 mmol/L (ref 135–145)

## 2017-04-23 LAB — VANCOMYCIN, TROUGH: VANCOMYCIN TR: 14 ug/mL — AB (ref 15–20)

## 2017-04-23 NOTE — Telephone Encounter (Signed)
Lmov for patient to call back and schedule 26m follow up  She is former patient of Dr Yvone Neu

## 2017-04-25 ENCOUNTER — Encounter: Payer: Medicare Other | Admitting: Surgery

## 2017-04-25 DIAGNOSIS — E11621 Type 2 diabetes mellitus with foot ulcer: Secondary | ICD-10-CM | POA: Diagnosis not present

## 2017-04-27 NOTE — Progress Notes (Signed)
[name] ([id number]) Visit Report for 04/25/2017 Chief Complaint Document Details Patient Name: [name], [name]. Date of Service: 04/25/2017 12:30 PM Medical Record Number: [id number] Patient Account Number: [id number] Date of Birth/Sex: 09/07/1966 (51 y.o. Female) Treating RN: Carolyne Fiscal, Debi Primary Care Provider: Delight Stare Other Clinician: Referring Provider: Delight Stare Treating Provider/Extender: Frann Rider in Treatment: 13 Information Obtained from: Patient Chief Complaint Patients presents for treatment of an open diabetic ulcer with an arterial etiology on her right lateral ankle and right gluteal area for about 4 months Electronic Signature(s) Signed: 04/25/2017 1:34:43 PM By: Christin Fudge MD, FACS Entered By: Christin Fudge on 04/25/2017 13:34:43 [name] ([id number]) -------------------------------------------------------------------------------- HPI Details Patient Name: [name]. Date of Service: 04/25/2017 12:30 PM Medical Record Number: [id number] Patient Account Number: [id number] Date of Birth/Sex: December 26, 1965 (51 y.o. Female) Treating RN: Carolyne Fiscal, Debi Primary Care Provider: Delight Stare Other Clinician: Referring Provider: Delight Stare Treating Provider/Extender: Frann Rider in Treatment: 13 History of Present Illness Location: right lateral ankle and right gluteal area Quality: Patient reports experiencing a sharp pain to affected area(s). Severity: Patient states wound are getting worse. Duration: Patient has had the wound for > 4 months prior to seeking treatment at the wound center Timing: Pain in wound is constant (hurts all the time) Context: The wound occurred when the patient was in hospital with a necrotizing fasciitis of the right gluteal area and a ulceration on the right ankle Modifying Factors: Other treatment(s) tried include:admitted to the hospital for IV antibiotics and a full workup and has also had a  recent angiogram Associated Signs and Symptoms: Patient reports having increase discharge. HPI Description: 51 year old patient was sent to [place] from [facility] where she was seen by Dr. Sherril Cong for a left ankle ulceration and was recently hospitalized with hypotension and sepsis. She was treated with IV antibiotics and has been scheduled to see Dr. Sharol Given. She was seen by vascular surgery who recommended a femoral bypass but surgery has been delayed until her sacral wound from last year's necrotizing fasciitis has healed. Was seeing the wound care team at [facility] but wanted to change over. She is a smoker and smokes a pack of cigarettes a day The patient was recently admitted in [place] between February 2 and February 14. She had a follow- up to see vascular surgery, orthopedic surgery and infectious disease. 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 Review of the electronic medical records reveals that Dr. 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 An MRI showed edema in the bone which was consistent with osteomyelitis and the chronic ulcer and may need surgical intervention. She also had a sacral decubitus ulcer which was treated with the wound VAC. On 01/07/2017 she was taken up by the vascular surgeon Dr. Trula Slade for an abdominal aortogram and bilateral lower extremity runoff, for a history of having bilateral femoropopliteal bypass graft as well as external iliac stenting on the right and stenting of her bypass graft. She had developed a nonhealing  wound on her right ankle and there was a possibility of a femoral occlusion. the findings were  noted and the impression was a surgical revascularization with a aorto bifemoral bypass graft. Both the femoropopliteal bypass grafts were patent. 01/31/2017 -- x-ray of the right hip and pelvis -- IMPRESSION:No radiographic evidence of acute osteomyelitis. Normal-appearing right hip joint space for age. No acute bony abnormality of the hip. 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 Since seeing her last week she has not had an appointment with infectious disease or the orthopedic specialist yet. [name], [name] ([id number]) 02/06/2017 -- the patient missed a couple of appointments yesterday due to the weather but other than that has apparently given up smoking for the last 5 days. 02/13/2017 -- she has rescheduled her infectious disease appointment and also the appointment with the orthopedic surgeon Dr. Sharol Given 03/06/2017 -- she was seen by Dr. Lucianne Lei dam of infectious disease on 03/05/2017 and he recommendedIV antibiotics but the patient did not want to have that and he has given her Augmentin and doxycycline and will reevaluate her in 2 months time. 03/13/2017 -- he saw Dr. Trula Slade regarding her vascular issues and he would like to wait to the sacral wound is completely closed before considering aortobifemoral bypass graft. He will see her back in 2 months time. She was also seen by Dr. Sharol Given of orthopedics who recommended continue wound care dressings and follow in the office in about 2 months time. he also recommended 3 view radiographs of the right ankle at follow-up. 03/28/2017 -- she did have a PICC line placed and Dr. Lucianne Lei dam has begun IV vancomycin and ceftriaxone. she is going to continue this until she sees him in approximately a month's time 04/18/2017 -- we had applied for a skin substitute for the patient's care but her copayment  is going to be about $300 a piece and the patient will not be able to afford this. Electronic Signature(s) Signed: 04/25/2017 1:34:49 PM By: Christin Fudge MD, FACS Entered By: Christin Fudge on 04/25/2017 13:34:49 Funches, [name] ([id number]) -------------------------------------------------------------------------------- Physical Exam Details Patient Name: [name], [name] Date of Service: 04/25/2017 12:30 PM Medical Record Number: [id number] Patient Account Number: [id number] Date of Birth/Sex: [birth year] (51 y.o. Female) Treating RN: Carolyne Fiscal, Debi Primary Care Provider: Delight Stare Other Clinician: Referring Provider: Delight Stare Treating Provider/Extender: Frann Rider in Treatment: 13 Constitutional . Pulse regular. Respirations normal and unlabored. Afebrile. . Eyes Nonicteric. Reactive to light. Ears, Nose, Mouth, and Throat Lips, teeth, and gums WNL.Marland Kitchen Moist mucosa without lesions. Neck supple and nontender. No palpable supraclavicular or cervical adenopathy. Normal sized without goiter. Respiratory WNL. No retractions.. Breath sounds WNL, No rubs, rales, rhonchi, or wheeze.. Cardiovascular Heart rhythm and rate regular, no murmur or gallop.. Pedal Pulses WNL. No clubbing, cyanosis or edema. Lymphatic No adneopathy. No adenopathy. No adenopathy. Musculoskeletal Adexa without tenderness or enlargement.. Digits and nails w/o clubbing, cyanosis, infection, petechiae, ischemia, or inflammatory conditions.. Integumentary (Hair, Skin) No suspicious lesions. No crepitus or fluctuance. No peri-wound warmth or erythema. No masses.Marland Kitchen Psychiatric Judgement and insight Intact.. No evidence of depression, anxiety, or agitation.. Notes the right ankle wound was washed out with moist saline gauze and some of the debris was removed. It is undermining circumferentially but its overall improved very well. The right gluteal wound continues to have minimal maceration at the edges but  the base of the wound is looking clean. No sharp debridement was required today. Electronic Signature(s) Signed: 04/25/2017 1:35:28 PM By: Christin Fudge MD, FACS Entered By: Con Memos  Klaryssa Fauth on 04/25/2017 13:35:27 [name], [name] ([id number]) -------------------------------------------------------------------------------- Physician Orders Details Patient Name: [name], [name]. Date of Service: 04/25/2017 12:30 PM Medical Record Number: [id number] Patient Account Number: [id number] Date of Birth/Sex: 1966-06-15 (51 y.o. Female) Treating RN: Carolyne Fiscal, Debi Primary Care Provider: Delight Stare Other Clinician: Referring Provider: Delight Stare Treating Provider/Extender: Frann Rider in Treatment: 14 Verbal / Phone Orders: Yes Clinician: Carolyne Fiscal, Debi Read Back and Verified: Yes Diagnosis Coding Wound Cleansing Wound #1 Right,Lateral Malleolus o Clean wound with Normal Saline. Wound #2 Right Gluteus o Clean wound with Normal Saline. Anesthetic Wound #1 Right,Lateral Malleolus o Topical Lidocaine 4% cream applied to wound bed prior to debridement - in clinic Wound #2 Right Gluteus o Topical Lidocaine 4% cream applied to wound bed prior to debridement - in clinic Skin Barriers/Peri-Wound Care Wound #1 Right,Lateral Malleolus o Skin Prep Wound #2 Right Gluteus o Skin Prep Primary Wound Dressing Wound #1 Right,Lateral Malleolus o Prisma Ag - please pack into the undermining's. Wound #2 Right Gluteus o Prisma Ag - please pack into the undermining's. Secondary Dressing Wound #1 Right,Lateral Malleolus o Dry Gauze o Boardered Foam Dressing Wound #2 Right Gluteus o Dry Gauze o Boardered Foam Dressing - in clinic until Maxwell, Froid. ([id number]) Dressing Change Frequency Wound #1 Right,Lateral Malleolus o Change dressing every day. Wound #2 Right Gluteus o Change Dressing Monday, Wednesday, Friday Follow-up Appointments Wound  #1 Right,Lateral Malleolus o Return Appointment in 1 week. Wound #2 Right Gluteus o Return Appointment in 1 week. Additional Orders / Instructions Wound #1 Right,Lateral Malleolus o Stop Smoking o Increase protein intake. o Activity as tolerated o Other: - Please add vitamin A, vitamin C and zinc supplements to your diet Wound #2 Right Gluteus o Stop Smoking o Increase protein intake. o Activity as tolerated o Other: - Please add vitamin A, vitamin C and zinc supplements to your diet Home Health Wound #1 [place] Visits - [place] Nurse may visit PRN to address patientos wound care needs. o FACE TO FACE ENCOUNTER: MEDICARE and MEDICAID PATIENTS: I certify that this patient is under my care and that I had a face-to-face encounter that meets the physician face-to-face encounter requirements with this patient on this date. The encounter with the patient was in whole or in part for the following MEDICAL CONDITION: (primary reason for [place]) MEDICAL NECESSITY: I certify, that based on my findings, NURSING services are a medically necessary home health service. HOME BOUND STATUS: I certify that my clinical findings support that this patient is homebound (i.e., Due to illness or injury, pt requires aid of supportive devices such as crutches, cane, wheelchairs, walkers, the use of special transportation or the assistance of another person to leave their place of residence. There is a normal inability to leave the home and doing so requires considerable and taxing effort. Other absences are for medical reasons / religious services and are infrequent or of short duration when for other reasons). o If current dressing causes regression in wound condition, may D/C ordered dressing product/s and apply Normal Saline Moist Dressing daily until next [place] / Other MD appointment.  [place] of regression in wound condition at [phone]. [name], [name] ([id number]) o Please direct any NON-WOUND related issues/requests for orders to patient's Primary Care Physician Wound #2 Right [place] Visits - [place] Nurse may visit PRN to  address patientos wound care needs. o FACE TO FACE ENCOUNTER: MEDICARE and MEDICAID PATIENTS: I certify that this patient is under my care and that I had a face-to-face encounter that meets the physician face-to-face encounter requirements with this patient on this date. The encounter with the patient was in whole or in part for the following MEDICAL CONDITION: (primary reason for [place]) MEDICAL NECESSITY: I certify, that based on my findings, NURSING services are a medically necessary home health service. HOME BOUND STATUS: I certify that my clinical findings support that this patient is homebound (i.e., Due to illness or injury, pt requires aid of supportive devices such as crutches, cane, wheelchairs, walkers, the use of special transportation or the assistance of another person to leave their place of residence. There is a normal inability to leave the home and doing so requires considerable and taxing effort. Other absences are for medical reasons / religious services and are infrequent or of short duration when for other reasons). o If current dressing causes regression in wound condition, may D/C ordered dressing product/s and apply Normal Saline Moist Dressing daily until next [place] / Other MD appointment. [place] of regression in wound condition at [phone]. o Please direct any NON-WOUND related issues/requests for orders to patient's Primary Care Physician Negative Pressure Wound Therapy Wound #2 Right Gluteus o Wound VAC settings at 125/130 mmHg continuous pressure. Use BLACK/GREEN foam to wound  cavity. Use WHITE foam to fill any tunnel/s and/or undermining. Change VAC dressing 3 X WEEK. Change canister as indicated when full. Nurse may titrate settings and frequency of dressing changes as clinically indicated. - black foam to wound cavity only; BRIDGE TO RIGHT HIP. Electronic Signature(s) Signed: 04/25/2017 4:12:58 PM By: Christin Fudge MD, FACS Signed: 04/25/2017 4:22:44 PM By: Alric Quan Entered By: Alric Quan on 04/25/2017 12:52:46 Shoults, Thai Chauncey [name] ([id number]) -------------------------------------------------------------------------------- Problem List Details Patient Name: [name], [name]. Date of Service: 04/25/2017 12:30 PM Medical Record Number: [id number] Patient Account Number: [id number] Date of Birth/Sex: 1966-01-14 (51 y.o. 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 ([id number]) -------------------------------------------------------------------------------- Progress Note Details Patient Name: [name]. Date of Service: 04/25/2017 12:30 PM Medical Record Number: [id number] Patient Account  Number: [id number] Date of Birth/Sex: Jul 20, 1966 (51 y.o. Female) Treating RN: Carolyne Fiscal, Debi Primary Care Provider: Delight Stare Other Clinician: Referring Provider: Delight Stare Treating Provider/Extender: Frann Rider in Treatment: 13 Subjective Chief Complaint Information obtained from Patient Patients presents for treatment of an open diabetic ulcer with an arterial etiology on her right lateral ankle and right gluteal area for about 4 months History of Present Illness (HPI) The following HPI elements were documented for the patient's wound: Location: right lateral ankle and right gluteal area Quality: Patient reports experiencing a sharp pain to affected area(s). Severity: Patient states  wound are getting worse. Duration: Patient has had the wound for > 4 months prior to seeking treatment at the wound center Timing: Pain in wound is constant (hurts all the time) Context: The wound occurred when the patient was in hospital with a necrotizing fasciitis of the right gluteal area and a ulceration on the right ankle Modifying Factors: Other treatment(s) tried include:admitted to the hospital for IV antibiotics and a full workup and has also had a recent angiogram Associated Signs and Symptoms: Patient reports having increase discharge. 51 year old patient was sent to [place] from [facility] where she was seen by Dr. Sherril Cong for a left ankle ulceration and was recently hospitalized with hypotension and sepsis. She was treated with IV antibiotics and has been scheduled to see Dr. Sharol Given. She was seen by vascular surgery who recommended a femoral bypass but surgery has been delayed until her sacral wound from last year's necrotizing fasciitis has healed. Was seeing the wound care team at [facility] but wanted to change over. She is a smoker and smokes a pack of cigarettes a day The patient was recently admitted in [place] between February 2 and February 14. She had a  follow- up to see vascular surgery, orthopedic surgery and infectious disease. 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 Review of the electronic medical records reveals that Dr. 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 An MRI showed edema in the bone which was consistent with osteomyelitis and the chronic ulcer and may need surgical intervention. She also had a sacral decubitus ulcer which was treated with the wound VAC. On 01/07/2017 she was taken up by the vascular surgeon Dr. Trula Slade for an abdominal aortogram and bilateral lower extremity runoff, for a history of having bilateral femoropopliteal bypass graft as well as external iliac stenting on the right and stenting of her bypass graft. She had developed a nonhealing wound on her right ankle and there was a possibility of a femoral occlusion. the findings were noted and the [name], LONA. ([id number]) impression was a surgical revascularization with a aorto bifemoral bypass graft. Both the femoropopliteal bypass grafts were patent. 01/31/2017 -- x-ray of the right hip and pelvis -- IMPRESSION:No radiographic evidence of acute osteomyelitis. Normal-appearing right hip joint space for age. No acute bony abnormality of the hip. 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 Since seeing her last week she has not had an appointment with infectious disease or the orthopedic specialist yet. 02/06/2017 -- the patient missed a couple of appointments yesterday  due to the weather but other than that has apparently given up smoking for the last 5 days. 02/13/2017 -- she has rescheduled her infectious disease appointment and also the appointment with the orthopedic surgeon Dr. Sharol Given 03/06/2017 -- she was seen by Dr. Lucianne Lei dam of infectious disease on 03/05/2017 and he recommendedIV antibiotics but the patient did not want to have that and he has given her Augmentin and doxycycline and will reevaluate her in 2 months time. 03/13/2017 -- he saw Dr. Trula Slade regarding her vascular issues and  he would like to wait to the sacral wound is completely closed before considering aortobifemoral bypass graft. He will see her back in 2 months time. She was also seen by Dr. Sharol Given of orthopedics who recommended continue wound care dressings and follow in the office in about 2 months time. he also recommended 3 view radiographs of the right ankle at follow-up. 03/28/2017 -- she did have a PICC line placed and Dr. Lucianne Lei dam has begun IV vancomycin and ceftriaxone. she is going to continue this until she sees him in approximately a month's time 04/18/2017 -- we had applied for a skin substitute for the patient's care but her copayment is going to be about $300 a piece and the patient will not be able to afford this. Objective Constitutional Pulse regular. Respirations normal and unlabored. Afebrile. Vitals Time Taken: 12:36 PM, Height: 67 in, Weight: 137 lbs, BMI: 21.5, Temperature: 97.8 F, Pulse: 51 bpm, Respiratory Rate: 16 breaths/min, Blood Pressure: 80/63 mmHg. Eyes Nonicteric. Reactive to light. Ears, Nose, Mouth, and Throat Lips, teeth, and gums WNL.Marland Kitchen Moist mucosa without lesions. Neck supple and nontender. No palpable supraclavicular or cervical adenopathy. Normal sized without goiter. [name], [name]. ([id number]) Respiratory WNL. No retractions.. Breath sounds WNL, No rubs, rales, rhonchi, or wheeze.. Cardiovascular Heart rhythm and rate regular, no murmur  or gallop.. Pedal Pulses WNL. No clubbing, cyanosis or edema. Lymphatic No adneopathy. No adenopathy. No adenopathy. Musculoskeletal Adexa without tenderness or enlargement.. Digits and nails w/o clubbing, cyanosis, infection, petechiae, ischemia, or inflammatory conditions.Marland Kitchen Psychiatric Judgement and insight Intact.. No evidence of depression, anxiety, or agitation.. General Notes: the right ankle wound was washed out with moist saline gauze and some of the debris was removed. It is undermining circumferentially but its overall improved very well. The right gluteal wound continues to have minimal maceration at the edges but the base of the wound is looking clean. No sharp debridement was required today. Integumentary (Hair, Skin) No suspicious lesions. No crepitus or fluctuance. No peri-wound warmth or erythema. No masses.. Wound #1 status is Open. Original cause of wound was Gradually Appeared. The wound is located on the Right,Lateral Malleolus. The wound measures 1.2cm length x 1.6cm width x 0.3cm depth; 1.508cm^2 area and 0.452cm^3 volume. There is Fat Layer (Subcutaneous Tissue) Exposed exposed. There is no tunneling or undermining noted. There is a large amount of purulent drainage noted. The wound margin is distinct with the outline attached to the wound base. There is medium (34-66%) pink granulation within the wound bed. There is a medium (34-66%) amount of necrotic tissue within the wound bed including Adherent Slough. The periwound skin appearance exhibited: Induration, Maceration. The periwound skin appearance did not exhibit: Callus, Crepitus, Excoriation, Rash, Scarring, Dry/Scaly, Atrophie Blanche, Cyanosis, Ecchymosis, Hemosiderin Staining, Mottled, Pallor, Rubor, Erythema. Periwound temperature was noted as No Abnormality. The periwound has tenderness on palpation. Wound #2 status is Open. Original cause of wound was Gradually Appeared. The wound is located on the Right  Gluteus. The wound measures 2.8cm length x 1.7cm width x 2.2cm depth; 3.738cm^2 area and 8.225cm^3 volume. There is no tunneling or undermining noted. There is a large amount of serous drainage noted. The wound margin is distinct with the outline attached to the wound base. There is medium (34-66%) red, pink granulation within the wound bed. There is a medium (34-66%) amount of necrotic tissue within the wound bed including Adherent Slough. The periwound skin appearance exhibited: Maceration. Periwound temperature was noted as No Abnormality. The periwound has tenderness on  palpation. 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 Wound #2 Right Gluteus: Clean wound with Normal Saline. Anesthetic: Wound #1 Right,Lateral Malleolus: Topical Lidocaine 4% cream applied to wound bed prior to debridement - in clinic Wound #2 Right Gluteus: Topical Lidocaine 4% cream applied to wound bed prior to debridement - in clinic Skin Barriers/Peri-Wound Care: Wound #1 Right,Lateral Malleolus: Skin Prep Wound #2 Right Gluteus: Skin Prep Primary Wound Dressing: Wound #1 Right,Lateral Malleolus: Prisma Ag - please pack into the undermining's. Wound #2 Right Gluteus: Prisma Ag - please pack into the undermining's. Secondary Dressing: Wound #1 Right,Lateral Malleolus: Dry Gauze Boardered Foam Dressing Wound #2 Right Gluteus: Dry Gauze Boardered Foam Dressing - in clinic until [facility] applies  VAC Dressing Change Frequency: Wound #1 Right,Lateral Malleolus: Change dressing every day. Wound #2 Right Gluteus: Change Dressing Monday, Wednesday, Friday Follow-up Appointments: [name], [name] ([id number]) Wound #1 Right,Lateral Malleolus: Return Appointment in 1 week. Wound #2 Right Gluteus: Return Appointment in 1 week. Additional Orders / Instructions: Wound #1 Right,Lateral Malleolus: Stop Smoking Increase protein intake. Activity as tolerated Other: - Please add vitamin A, vitamin C and zinc supplements to your diet Wound #2 Right Gluteus: Stop Smoking Increase protein intake. Activity as tolerated Other: - Please add vitamin A, vitamin C and zinc supplements to your diet Home Health: Wound #1 Right,Lateral Malleolus: [place] Visits - [place] Nurse may visit PRN to address patient s wound care needs. FACE TO FACE ENCOUNTER: MEDICARE and MEDICAID PATIENTS: I certify that this patient is under my care and that I had a face-to-face encounter that meets the physician face-to-face encounter requirements with this patient on this date. The encounter with the patient was in whole or in part for the following MEDICAL CONDITION: (primary reason for [place]) MEDICAL NECESSITY: I certify, that based on my findings, NURSING services are a medically necessary home health service. HOME BOUND STATUS: I certify that my clinical findings support that this patient is homebound (i.e., Due to illness or injury, pt requires aid of supportive devices such as crutches, cane, wheelchairs, walkers, the use of special transportation or the assistance of another person to leave their place of residence. There is a normal inability to leave the home and doing so requires considerable and taxing effort. Other absences are for medical reasons / religious services and are infrequent or of short duration when for other reasons). If current dressing causes  regression in wound condition, may D/C ordered dressing product/s and apply Normal Saline Moist Dressing daily until next [place] / Other MD appointment. [place] of regression in wound condition at [phone]. Please direct any NON-WOUND related issues/requests for orders to patient's Primary Care Physician Wound #2 Right Gluteus: [place] Nurse may visit PRN to address patient s wound care needs. FACE TO FACE ENCOUNTER: MEDICARE and MEDICAID PATIENTS: I certify that this patient is under my care and that I had a face-to-face encounter that meets the physician face-to-face encounter requirements with this  patient on this date. The encounter with the patient was in whole or in part for the following MEDICAL CONDITION: (primary reason for [place]) MEDICAL NECESSITY: I certify, that based on my findings, NURSING services are a medically necessary home health service. HOME BOUND STATUS: I certify that my clinical findings support that this patient is homebound (i.e., Due to illness or injury, pt requires aid of supportive devices such as crutches, cane, wheelchairs, walkers, the use of special transportation or the assistance of another person to leave their place of residence. There is a normal inability to leave the home and doing so requires considerable and taxing effort. Other absences are for medical reasons / religious services and are infrequent or of short duration when for other reasons). If current dressing causes regression in wound condition, may D/C ordered dressing product/s and apply Normal Saline Moist Dressing daily until next [place] / Other MD appointment. [place] of regression in wound condition at [phone]. Please direct any NON-WOUND related issues/requests for orders to patient's Primary Care Physician Negative Pressure Wound Therapy: WAVER, DIBIASIO. ([id number]) Wound #2 Right Gluteus: Wound VAC settings at 125/130 mmHg continuous pressure. Use BLACK/GREEN foam to wound cavity. Use WHITE foam to fill any tunnel/s and/or undermining. Change VAC dressing 3 X WEEK. Change canister as indicated when full. Nurse may titrate settings and frequency of dressing changes as clinically indicated. - black foam to wound cavity only; BRIDGE TO RIGHT HIP. After review I have recommended: 1. Silver collagen to be applied to the right ankle, change daily 2. Silver collagen under the black foam for the wound VAC, to the right gluteal region to be changed 3 times a week. 3. adequate protein, vitamin A, vitamin C and zinc 4. commended her on continuing to be off smoking Electronic Signature(s) Signed: 04/25/2017 1:36:21 PM By: Christin Fudge MD, FACS Entered By: Christin Fudge on 04/25/2017 13:36:21 [place], Iram Chauncey [name] ([id number]) -------------------------------------------------------------------------------- SuperBill Details Patient Name: [name]. Date of Service: 04/25/2017 Medical Record Number: [id number] Patient Account Number: [id number] Date of Birth/Sex: 12-Feb-1966 (51 y.o. 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, right ankle and foot Facility Procedures CPT4 Code: [id number] Description: 99214 - WOUND CARE VISIT-LEV 4 EST PT Modifier: Quantity: 1 Physician  Procedures CPT4 Code Description: [id number] - WC PHYS LEVEL 3 - EST PT ICD-10 Description Diagnosis 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

## 2017-04-27 NOTE — Progress Notes (Signed)
[name], [name] ([id number]) Visit Report for 04/25/2017 Arrival Information Details Patient Name: [name], [name]. Date of Service: 04/25/2017 12:30 PM Medical Record Number: [id number] Patient Account Number: [id number] Date of Birth/Sex: 02-25-1966 (51 y.o. Female) Treating RN: Carolyne Fiscal, Debi Primary Care Dory Demont: Delight Stare Other Clinician: Referring Imoni Kohen: Delight Stare Treating Marie Borowski/Extender: Frann Rider in Treatment: 13 Visit Information History Since Last Visit All ordered tests and consults were completed: No Patient Arrived: Cane Added or deleted any medications: No Arrival Time: 12:34 Any new allergies or adverse reactions: No Accompanied By: self Had a fall or experienced change in No Transfer Assistance: EasyPivot activities of daily living that may affect Patient Lift risk of falls: Patient Identification Verified: Yes Signs or symptoms of abuse/neglect since last No Secondary Verification Process Yes visito Completed: Hospitalized since last visit: No Patient Requires Transmission- No Has Dressing in Place as Prescribed: Yes Based Precautions: Pain Present Now: Yes Patient Has Alerts: No Electronic Signature(s) Signed: 04/25/2017 4:22:44 PM By: Alric Quan Entered By: Alric Quan on 04/25/2017 12:35:14 Rantz, Deyja S. ([id number]) -------------------------------------------------------------------------------- Clinic Level of Care Assessment Details Patient Name: [name]. Date of Service: 04/25/2017 12:30 PM Medical Record Number: [id number] Patient Account Number: [id number] Date of Birth/Sex: 12-25-1965 (51 y.o. Female) Treating RN: Carolyne Fiscal, Debi Primary Care Shere Eisenhart: Delight Stare Other Clinician: Referring Nasim Habeeb: Delight Stare Treating Janaria Mccammon/Extender: Frann Rider in Treatment: 13 Clinic Level of Care Assessment Items TOOL 4 Quantity Score X - Use when only an EandM is performed on FOLLOW-UP visit 1  0 ASSESSMENTS - Nursing Assessment / Reassessment X - Reassessment of Co-morbidities (includes updates in patient status) 1 10 X - Reassessment of Adherence to Treatment Plan 1 5 ASSESSMENTS - Wound and Skin Assessment / Reassessment []  - Simple Wound Assessment / Reassessment - one wound 0 X - Complex Wound Assessment / Reassessment - multiple wounds 2 5 []  - Dermatologic / Skin Assessment (not related to wound area) 0 ASSESSMENTS - Focused Assessment []  - Circumferential Edema Measurements - multi extremities 0 []  - Nutritional Assessment / Counseling / Intervention 0 []  - Lower Extremity Assessment (monofilament, tuning fork, pulses) 0 []  - Peripheral Arterial Disease Assessment (using hand held doppler) 0 ASSESSMENTS - Ostomy and/or Continence Assessment and Care []  - Incontinence Assessment and Management 0 []  - Ostomy Care Assessment and Management (repouching, etc.) 0 PROCESS - Coordination of Care []  - Simple Patient / Family Education for ongoing care 0 X - Complex (extensive) Patient / Family Education for ongoing care 1 20 X - Staff obtains Programmer, systems, Records, Test Results / Process Orders 1 10 X - Staff telephones HHA, Nursing Homes / Clarify orders / etc 1 10 []  - Routine Transfer to another Facility (non-emergent condition) 0 Coupe, Yvonne S. ([id number]) []  - Routine Hospital Admission (non-emergent condition) 0 []  - New Admissions / Biomedical engineer / Ordering NPWT, Apligraf, etc. 0 []  - Emergency Hospital Admission (emergent condition) 0 X - Simple Discharge Coordination 1 10 []  - Complex (extensive) Discharge Coordination 0 PROCESS - Special Needs []  - Pediatric / Minor Patient Management 0 []  - Isolation Patient Management 0 []  - Hearing / Language / Visual special needs 0 []  - Assessment of Community assistance (transportation, D/C planning, etc.) 0 []  - Additional assistance / Altered mentation 0 []  - Support Surface(s) Assessment (bed, cushion, seat,  etc.) 0 INTERVENTIONS - Wound Cleansing / Measurement []  - Simple Wound Cleansing - one wound 0 X - Complex Wound Cleansing - multiple wounds 2 5 X -  Wound Imaging (photographs - any number of wounds) 1 5 []  - Wound Tracing (instead of photographs) 0 []  - Simple Wound Measurement - one wound 0 X - Complex Wound Measurement - multiple wounds 2 5 INTERVENTIONS - Wound Dressings X - Small Wound Dressing one or multiple wounds 1 10 X - Medium Wound Dressing one or multiple wounds 1 15 []  - Large Wound Dressing one or multiple wounds 0 X - Application of Medications - topical 1 5 []  - Application of Medications - injection 0 INTERVENTIONS - Miscellaneous []  - External ear exam 0 Provencio, Natara S. ([id number]) []  - Specimen Collection (cultures, biopsies, blood, body fluids, etc.) 0 []  - Specimen(s) / Culture(s) sent or taken to Lab for analysis 0 []  - Patient Transfer (multiple staff / Harrel Lemon Lift / Similar devices) 0 []  - Simple Staple / Suture removal (25 or less) 0 []  - Complex Staple / Suture removal (26 or more) 0 []  - Hypo / Hyperglycemic Management (close monitor of Blood Glucose) 0 []  - Ankle / Brachial Index (ABI) - do not check if billed separately 0 X - Vital Signs 1 5 Has the patient been seen at the hospital within the last three years: Yes Total Score: 135 Level Of Care: New/Established - Level 4 Electronic Signature(s) Signed: 04/25/2017 4:22:44 PM By: Alric Quan Entered By: Alric Quan on 04/25/2017 McKinney, Burke. ([id number]) -------------------------------------------------------------------------------- Encounter Discharge Information Details Patient Name: [name]. Date of Service: 04/25/2017 12:30 PM Medical Record Number: [id number] Patient Account Number: [id number] Date of Birth/Sex: 1966/02/14 (51 y.o. Female) Treating RN: Carolyne Fiscal, Debi Primary Care Yaira Bernardi: Delight Stare Other Clinician: Referring Adrienne Delay: Delight Stare Treating  Cheynne Virden/Extender: Frann Rider in Treatment: 13 Encounter Discharge Information Items Discharge Pain Level: 6 Discharge Condition: Stable Ambulatory Status: Cane Discharge Destination: Home Private Transportation: Auto Accompanied By: self Schedule Follow-up Appointment: Yes Medication Reconciliation completed and No provided to Patient/Care Doreather Hoxworth: Patient Clinical Summary of Care: Declined Electronic Signature(s) Signed: 04/25/2017 1:08:16 PM By: Sharon Mt Entered By: Sharon Mt on 04/25/2017 13:08:16 Kube, [place]. ([id number]) -------------------------------------------------------------------------------- Lower Extremity Assessment Details Patient Name: SAROYA, RICCOBONO. Date of Service: 04/25/2017 12:30 PM Medical Record Number: [id number] Patient Account Number: [id number] Date of Birth/Sex: Apr 04, 1966 (51 y.o. Female) Treating RN: Carolyne Fiscal, Debi Primary Care Darinda [place]: Delight Stare Other Clinician: Referring Oney Tatlock: Delight Stare Treating Dazani Norby/Extender: Frann Rider in Treatment: 13 Vascular Assessment Pulses: Dorsalis Pedis Palpable: [Right:Yes] Posterior Tibial Extremity colors, hair growth, and conditions: Extremity Color: [Right:Normal] Temperature of Extremity: [Right:Warm] Capillary Refill: [Right:< 3 seconds] Toe Nail Assessment Left: Right: Thick: Yes Discolored: Yes Deformed: No Improper Length and Hygiene: Yes Electronic Signature(s) Signed: 04/25/2017 4:22:44 PM By: Alric Quan Entered By: Alric Quan on 04/25/2017 12:48:01 Gauna, [place]. ([id number]) -------------------------------------------------------------------------------- Multi Wound Chart Details Patient Name: [name]. Date of Service: 04/25/2017 12:30 PM Medical Record Number: [id number] Patient Account Number: [id number] Date of Birth/Sex: November 20, 1965 (51 y.o. Female) Treating RN: Carolyne Fiscal, Debi Primary Care Yazmen Briones: Delight Stare Other  Clinician: Referring Darcella Shiffman: Delight Stare Treating Jerzy Crotteau/Extender: Frann Rider in Treatment: 13 Vital Signs Height(in): 67 Pulse(bpm): 51 Weight(lbs): 137 Blood Pressure 80/63 (mmHg): Body Mass Index(BMI): 21 Temperature(F): 97.8 Respiratory Rate 16 (breaths/min): Photos: [1:No Photos] [2:No Photos] [N/A:N/A] Wound Location: [1:Right Malleolus - Lateral] [2:Right Gluteus] [N/A:N/A] Wounding Event: [1:Gradually Appeared] [2:Gradually Appeared] [N/A:N/A] Primary Etiology: [1:Diabetic Wound/Ulcer of the Lower Extremity] [2:Open Surgical Wound] [N/A:N/A] Comorbid History: [1:Anemia, Arrhythmia, Coronary Artery Disease, Hypotension, Peripheral Arterial Disease, Type II Diabetes, Osteoarthritis] [2:Anemia,  Arrhythmia, Coronary Artery Disease, Hypotension, Peripheral Arterial Disease, Type II Diabetes,  Osteoarthritis] [N/A:N/A] Date Acquired: [1:09/23/2016] [2:09/18/2016] [N/A:N/A] Weeks of Treatment: [1:13] [2:13] [N/A:N/A] Wound Status: [1:Open] [2:Open] [N/A:N/A] Measurements L x W x D 1.2x1.6x0.3 [2:2.8x1.7x2.2] [N/A:N/A] (cm) Area (cm) : [1:1.508] [2:3.738] [N/A:N/A] Volume (cm) : [1:0.452] [2:8.225] [N/A:N/A] % Reduction in Area: [1:61.60%] [2:80.60%] [N/A:N/A] % Reduction in Volume: 77.00% [2:91.40%] [N/A:N/A] Classification: [1:Grade 2] [2:Full Thickness Without Exposed Support Structures] [N/A:N/A] Exudate Amount: [1:Large] [2:Large] [N/A:N/A] Exudate Type: [1:Purulent] [2:Serous] [N/A:N/A] Exudate Color: [1:yellow, brown, green] [2:amber] [N/A:N/A] Wound Margin: [1:Distinct, outline attached Distinct, outline attached] [N/A:N/A] Granulation Amount: [1:Medium (34-66%)] [2:Medium (34-66%)] [N/A:N/A] Granulation Quality: [1:Pink] [2:Red, Pink] [N/A:N/A] Necrotic Amount: [1:Medium (34-66%)] [2:Medium (34-66%)] [N/A:N/A] Exposed Structures: [2:N/A] [N/A:N/A] Fat Layer (Subcutaneous Tissue) Exposed: Yes Fascia: No Tendon: No Muscle: No Joint: No Bone:  No Epithelialization: None None N/A Periwound Skin Texture: Induration: Yes No Abnormalities Noted N/A Excoriation: No Callus: No Crepitus: No Rash: No Scarring: No Periwound Skin Maceration: Yes Maceration: Yes N/A Moisture: Dry/Scaly: No Periwound Skin Color: Atrophie Blanche: No No Abnormalities Noted N/A Cyanosis: No Ecchymosis: No Erythema: No Hemosiderin Staining: No Mottled: No Pallor: No Rubor: No Temperature: No Abnormality No Abnormality N/A Tenderness on Yes Yes N/A Palpation: Wound Preparation: Ulcer Cleansing: Ulcer Cleansing: N/A Rinsed/Irrigated with Rinsed/Irrigated with Saline Saline Topical Anesthetic Topical Anesthetic Applied: Other: lidocaine Applied: Other: lidocaine 4% 4% Treatment Notes Wound #1 (Right, Lateral Malleolus) 1. Cleansed with: Clean wound with Normal Saline 2. Anesthetic Topical Lidocaine 4% cream to wound bed prior to debridement 3. Peri-wound Care: Skin Prep 4. Dressing Applied: Prisma Ag 5. Secondary Dressing Applied Bordered Foam Dressing Dry Gauze Pena, Dustyn S. ([id number]) Wound #2 (Right Gluteus) 1. Cleansed with: Clean wound with Normal Saline 2. Anesthetic Topical Lidocaine 4% cream to wound bed prior to debridement 3. Peri-wound Care: Skin Prep 4. Dressing Applied: Prisma Ag 5. Secondary Dressing Applied Bordered Foam Dressing Dry Gauze Electronic Signature(s) Signed: 04/25/2017 1:34:36 PM By: Christin Fudge MD, FACS Entered By: Christin Fudge on 04/25/2017 13:34:36 Siebers, [name] ([id number]) -------------------------------------------------------------------------------- [place] Details Patient Name: LAVETTE, YANKOVICH. Date of Service: 04/25/2017 12:30 PM Medical Record Number: [id number] Patient Account Number: [id number] Date of Birth/Sex: 1965-11-25 (51 y.o. Female) Treating RN: Carolyne Fiscal, Debi Primary Care Letti Towell: Delight Stare Other Clinician: Referring Venida Tsukamoto: Delight Stare Treating Arsema Tusing/Extender: Frann Rider in Treatment: 13 Active Inactive ` Orientation to the Wound Care Program Nursing Diagnoses: Knowledge deficit related to the wound healing center program Goals: Patient/caregiver will verbalize understanding of the [facility] Date Initiated: 01/23/2017 Target Resolution Date: 05/25/2017 Goal Status: Active Interventions: Provide education on orientation to the wound center Notes: ` Peripheral Neuropathy Nursing Diagnoses: Knowledge deficit related to disease process and management of peripheral neurovascular dysfunction Potential alteration in peripheral tissue perfusion (select prior to confirmation of diagnosis) Goals: Patient/caregiver will verbalize understanding of disease process and disease management Date Initiated: 01/23/2017 Target Resolution Date: 05/25/2017 Goal Status: Active Interventions: Assess signs and symptoms of neuropathy upon admission and as needed Provide education on Management of Neuropathy and Related Ulcers Provide education on Management of Neuropathy upon discharge from the [facility] Activities: Test ordered outside of clinic : 01/23/2017 Notes: JYLIAN, PAPPALARDO ([id number]) ` Pressure Nursing Diagnoses: Knowledge deficit related to causes and risk factors for pressure ulcer development Knowledge deficit related to management of pressures ulcers Potential for impaired tissue integrity related to pressure, friction, moisture, and shear Goals: Patient will remain free from development of  additional pressure ulcers Date Initiated: 01/23/2017 Target Resolution Date: 05/25/2017 Goal Status: Active Patient will remain free of pressure ulcers Date Initiated: 01/23/2017 Target Resolution Date: 05/25/2017 Goal Status: Active Patient/caregiver will verbalize risk factors for pressure ulcer development Date Initiated: 01/23/2017 Target Resolution Date: 05/25/2017 Goal Status:  Active Patient/caregiver will verbalize understanding of pressure ulcer management Date Initiated: 01/23/2017 Target Resolution Date: 05/25/2017 Goal Status: Active Interventions: Assess: immobility, friction, shearing, incontinence upon admission and as needed Assess offloading mechanisms upon admission and as needed Assess potential for pressure ulcer upon admission and as needed Provide education on pressure ulcers Treatment Activities: Patient referred for pressure reduction/relief devices : 01/23/2017 Patient referred for seating evaluation to ensure proper offloading : 01/23/2017 Pressure reduction/relief device ordered : 01/23/2017 Notes: ` Wound/Skin Impairment Nursing Diagnoses: Impaired tissue integrity Knowledge deficit related to smoking impact on wound healing Knowledge deficit related to ulceration/compromised skin integrity Goals: Patient/caregiver will verbalize understanding of skin care regimen KALENE, CUTLER ([id number]) Date Initiated: 01/23/2017 Target Resolution Date: 05/25/2017 Goal Status: Active Ulcer/skin breakdown will have a volume reduction of 30% by week 4 Date Initiated: 01/23/2017 Target Resolution Date: 05/25/2017 Goal Status: Active Ulcer/skin breakdown will have a volume reduction of 50% by week 8 Date Initiated: 01/23/2017 Target Resolution Date: 05/25/2017 Goal Status: Active Ulcer/skin breakdown will have a volume reduction of 80% by week 12 Date Initiated: 01/23/2017 Target Resolution Date: 05/25/2017 Goal Status: Active Ulcer/skin breakdown will heal within 14 weeks Date Initiated: 01/23/2017 Target Resolution Date: 05/25/2017 Goal Status: Active Interventions: Assess patient/caregiver ability to obtain necessary supplies Assess patient/caregiver ability to perform ulcer/skin care regimen upon admission and as needed Assess ulceration(s) every visit Provide education on smoking Provide education on ulcer and skin care Treatment Activities: Patient referred  to home care : 01/23/2017 Referred to DME Rosalynd Mcwright for dressing supplies : 01/23/2017 Skin care regimen initiated : 01/23/2017 Topical wound management initiated : 01/23/2017 Notes: Electronic Signature(s) Signed: 04/25/2017 4:22:44 PM By: Alric Quan Entered By: Alric Quan on 04/25/2017 12:48:06 Tarleton, Kasen S. ([id number]) -------------------------------------------------------------------------------- Pain Assessment Details Patient Name: [name]. Date of Service: 04/25/2017 12:30 PM Medical Record Number: [id number] Patient Account Number: [id number] Date of Birth/Sex: 1966/02/20 (51 y.o. Female) Treating RN: Carolyne Fiscal, Debi Primary Care Marquiz Sotelo: Delight Stare Other Clinician: Referring Rethel Sebek: Delight Stare Treating Anelia Carriveau/Extender: Frann Rider in Treatment: 13 Active Problems Location of Pain Severity and Description of Pain Patient Has Paino Yes Site Locations Pain Location: Generalized Pain, Pain in Ulcers With Dressing Change: Yes Rate the pain. Current Pain Level: 10 Character of Pain Describe the Pain: Aching Pain Management and Medication Current Pain Management: Electronic Signature(s) Signed: 04/25/2017 4:22:44 PM By: Alric Quan Entered By: Alric Quan on 04/25/2017 12:35:31 Alban, [name] ([id number]) -------------------------------------------------------------------------------- Patient/Caregiver Education Details Patient Name: [name]. Date of Service: 04/25/2017 12:30 PM Medical Record Number: [id number] Patient Account Number: [id number] Date of Birth/Gender: [birth year] (51 y.o. Female) Treating RN: Carolyne Fiscal, Debi Primary Care Physician: Delight Stare Other Clinician: Referring Physician: Delight Stare Treating Physician/Extender: Frann Rider in Treatment: 13 Education Assessment Education Provided To: Patient Education Topics Provided Wound/Skin Impairment: Handouts: Other: change dressing as  ordered Methods: Demonstration, Explain/Verbal Responses: State content correctly Electronic Signature(s) Signed: 04/25/2017 4:22:44 PM By: Alric Quan Entered By: Alric Quan on 04/25/2017 12:50:43 Kight, Charlynn S. ([id number]) -------------------------------------------------------------------------------- Wound Assessment Details Patient Name: [name]. Date of Service: 04/25/2017 12:30 PM Medical Record Number: [id number] Patient Account Number: [id number] Date of Birth/Sex: Mar 29, 1966 (51 y.o. Female) Treating RN: Carolyne Fiscal,  Debi Primary Care Jaquis Picklesimer: Delight Stare Other Clinician: Referring Reshard Guillet: Delight Stare Treating Rayne Loiseau/Extender: Frann Rider in Treatment: 13 Wound Status Wound Number: 1 Primary Diabetic Wound/Ulcer of the Lower Etiology: Extremity Wound Location: Right Malleolus - Lateral Wound Open Wounding Event: Gradually Appeared Status: Date Acquired: 09/23/2016 Comorbid Anemia, Arrhythmia, Coronary Artery Weeks Of Treatment: 13 History: Disease, Hypotension, Peripheral Clustered Wound: No Arterial Disease, Type II Diabetes, Osteoarthritis Photos Photo Uploaded By: Alric Quan on 04/25/2017 16:15:34 Wound Measurements Length: (cm) 1.2 Width: (cm) 1.6 Depth: (cm) 0.3 Area: (cm) 1.508 Volume: (cm) 0.452 % Reduction in Area: 61.6% % Reduction in Volume: 77% Epithelialization: None Tunneling: No Undermining: No Wound Description Classification: Grade 2 Foul Odor Aft Wound Margin: Distinct, outline attached Slough/Fibrin Exudate Amount: Large Exudate Type: Purulent Exudate Color: yellow, brown, green er Cleansing: No o Yes Wound Bed Granulation Amount: Medium (34-66%) Exposed Structure Granulation Quality: Pink Fascia Exposed: No Necrotic Amount: Medium (34-66%) Fat Layer (Subcutaneous Tissue) Exposed: Yes Rohman, Keirah S. ([id number]) Necrotic Quality: Adherent Slough Tendon Exposed: No Muscle Exposed: No Joint  Exposed: No Bone Exposed: No Periwound Skin Texture Texture Color No Abnormalities Noted: No No Abnormalities Noted: No Callus: No Atrophie Blanche: No Crepitus: No Cyanosis: No Excoriation: No Ecchymosis: No Induration: Yes Erythema: No Rash: No Hemosiderin Staining: No Scarring: No Mottled: No Pallor: No Moisture Rubor: No No Abnormalities Noted: No Dry / Scaly: No Temperature / Pain Maceration: Yes Temperature: No Abnormality Tenderness on Palpation: Yes Wound Preparation Ulcer Cleansing: Rinsed/Irrigated with Saline Topical Anesthetic Applied: Other: lidocaine 4%, Treatment Notes Wound #1 (Right, Lateral Malleolus) 1. Cleansed with: Clean wound with Normal Saline 2. Anesthetic Topical Lidocaine 4% cream to wound bed prior to debridement 3. Peri-wound Care: Skin Prep 4. Dressing Applied: Prisma Ag 5. Secondary Dressing Applied Bordered Foam Dressing Dry Gauze Electronic Signature(s) Signed: 04/25/2017 4:22:44 PM By: Alric Quan Entered By: Alric Quan on 04/25/2017 12:47:14 Degraffenreid, Solstice S. ([id number]) -------------------------------------------------------------------------------- Wound Assessment Details Patient Name: [name]. Date of Service: 04/25/2017 12:30 PM Medical Record Number: [id number] Patient Account Number: [id number] Date of Birth/Sex: Jul 10, 1966 (51 y.o. Female) Treating RN: Carolyne Fiscal, Debi Primary Care Emika Tiano: Delight Stare Other Clinician: Referring Geraldin Habermehl: Delight Stare Treating Shloima Clinch/Extender: Frann Rider in Treatment: 13 Wound Status Wound Number: 2 Primary Open Surgical Wound Etiology: Wound Location: Right Gluteus Wound Open Wounding Event: Gradually Appeared Status: Date Acquired: 09/18/2016 Comorbid Anemia, Arrhythmia, Coronary Artery Weeks Of Treatment: 13 History: Disease, Hypotension, Peripheral Clustered Wound: No Arterial Disease, Type II Diabetes, Osteoarthritis Photos Photo Uploaded  By: Alric Quan on 04/25/2017 16:15:34 Wound Measurements Length: (cm) 2.8 Width: (cm) 1.7 Depth: (cm) 2.2 Area: (cm) 3.738 Volume: (cm) 8.225 % Reduction in Area: 80.6% % Reduction in Volume: 91.4% Epithelialization: None Tunneling: No Undermining: No Wound Description Full Thickness Without Exposed Classification: Support Structures Wound Margin: Distinct, outline attached Exudate Large Amount: Exudate Type: Serous Exudate Color: amber Foul Odor After Cleansing: No Slough/Fibrino No Wound Bed Granulation Amount: Medium (34-66%) Winger, Merryl S. ([id number]) Granulation Quality: Red, Pink Necrotic Amount: Medium (34-66%) Necrotic Quality: Adherent Slough Periwound Skin Texture Texture Color No Abnormalities Noted: No No Abnormalities Noted: No Moisture Temperature / Pain No Abnormalities Noted: No Temperature: No Abnormality Maceration: Yes Tenderness on Palpation: Yes Wound Preparation Ulcer Cleansing: Rinsed/Irrigated with Saline Topical Anesthetic Applied: Other: lidocaine 4%, Treatment Notes Wound #2 (Right Gluteus) 1. Cleansed with: Clean wound with Normal Saline 2. Anesthetic Topical Lidocaine 4% cream to wound bed prior to debridement 3. Peri-wound Care: Skin Prep 4.  Dressing Applied: Prisma Ag 5. Secondary Dressing Applied Bordered Foam Dressing Dry Gauze Electronic Signature(s) Signed: 04/25/2017 4:22:44 PM By: Alric Quan Entered By: Alric Quan on 04/25/2017 12:46:07 Bachtel, Berneita Chauncey Cruel ([id number]) -------------------------------------------------------------------------------- [place] Details Patient Name: [name]. Date of Service: 04/25/2017 12:30 PM Medical Record Number: [id number] Patient Account Number: [id number] Date of Birth/Sex: 1966-01-11 (51 y.o. Female) Treating RN: Carolyne Fiscal, Debi Primary Care Kaeo Jacome: Delight Stare Other Clinician: Referring Antoninette Lerner: Delight Stare Treating Julyssa Kyer/Extender: Frann Rider in Treatment: 13 Vital Signs Time Taken: 12:36 Temperature (F): 97.8 Height (in): 67 Pulse (bpm): 51 Weight (lbs): 137 Respiratory Rate (breaths/min): 16 Body Mass Index (BMI): 21.5 Blood Pressure (mmHg): 80/63 Reference Range: 80 - 120 mg / dl Electronic Signature(s) Signed: 04/25/2017 4:22:44 PM By: Alric Quan Entered By: Alric Quan on 04/25/2017 12:36:28

## 2017-04-28 NOTE — Telephone Encounter (Signed)
Lmov for patient to call back and schedule 33m follow up  She is former patient of Dr Yvone Neu

## 2017-04-30 ENCOUNTER — Other Ambulatory Visit (HOSPITAL_COMMUNITY)
Admission: RE | Admit: 2017-04-30 | Discharge: 2017-04-30 | Disposition: A | Discharge status: Home / Self Care | Payer: Medicare Other | Source: Other Acute Inpatient Hospital | Attending: Infectious Disease | Admitting: Infectious Disease

## 2017-04-30 ENCOUNTER — Encounter: Payer: Self-pay | Admitting: Surgery

## 2017-04-30 DIAGNOSIS — Z792 Long term (current) use of antibiotics: Secondary | ICD-10-CM | POA: Insufficient documentation

## 2017-04-30 DIAGNOSIS — Z5181 Encounter for therapeutic drug level monitoring: Secondary | ICD-10-CM | POA: Insufficient documentation

## 2017-04-30 LAB — BASIC METABOLIC PANEL
ANION GAP: 10 (ref 5–15)
BUN: 25 mg/dL — ABNORMAL HIGH (ref 6–20)
CHLORIDE: 102 mmol/L (ref 101–111)
CO2: 25 mmol/L (ref 22–32)
Calcium: 9 mg/dL (ref 8.9–10.3)
Creatinine, Ser: 1.08 mg/dL — ABNORMAL HIGH (ref 0.44–1.00)
GFR calc non Af Amer: 58 mL/min — ABNORMAL LOW (ref >60)
GLUCOSE: 183 mg/dL — AB (ref 65–99)
POTASSIUM: 4.8 mmol/L (ref 3.5–5.1)
Sodium: 137 mmol/L (ref 135–145)

## 2017-04-30 LAB — VANCOMYCIN, TROUGH: VANCOMYCIN TR: 14 ug/mL — AB (ref 15–20)

## 2017-04-30 LAB — C-REACTIVE PROTEIN: CRP: 2.8 mg/dL — ABNORMAL HIGH (ref <1.0)

## 2017-04-30 LAB — SEDIMENTATION RATE: SED RATE: 76 mm/h — AB (ref 0–22)

## 2017-05-01 ENCOUNTER — Encounter: Payer: Medicare Other | Admitting: Surgery

## 2017-05-01 DIAGNOSIS — E11621 Type 2 diabetes mellitus with foot ulcer: Secondary | ICD-10-CM | POA: Diagnosis not present

## 2017-05-03 NOTE — Progress Notes (Signed)
Jamie, [name] ([id number]) Visit Report for 05/01/2017 Arrival Information Details Patient Name: [name], [name]. Date of Service: 05/01/2017 1:30 PM Medical Record Number: [id number] Patient Account Number: [id number] Date of Birth/Sex: [birth year] (51 y.o. Female) Treating RN: Carolyne Fiscal, Debi Primary Care [name]: [name] Other Clinician: Referring [name]: [name] Treating [name]/Extender: Frann Rider in Treatment: 14 Visit Information History Since Last Visit All ordered tests and consults were completed: No Patient Arrived: Walker Added or deleted any medications: No Arrival Time: 13:35 Any new allergies or adverse reactions: No Accompanied By: self Had a fall or experienced change in No Transfer Assistance: EasyPivot activities of daily living that may affect Patient Lift risk of falls: Patient Identification Verified: Yes Signs or symptoms of abuse/neglect since last No Secondary Verification Process Yes visito Completed: Hospitalized since last visit: No Patient Requires Transmission- No Has Dressing in Place as Prescribed: Yes Based Precautions: Pain Present Now: Yes Patient Has Alerts: No Electronic Signature(s) Signed: 05/01/2017 5:25:16 PM By: Alric Quan Entered By: Alric Quan on 05/01/2017 13:36:52 [name], [name]. ([id number]) -------------------------------------------------------------------------------- Clinic Level of Care Assessment Details Patient Name: [name], [name]. Date of Service: 05/01/2017 1:30 PM Medical Record Number: [id number] Patient Account Number: [id number] Date of Birth/Sex: [birth year] (51 y.o. Female) Treating RN: Carolyne Fiscal, Debi Primary Care Tsering Leaman: [name] Other Clinician: Referring Chen Holzman: [name] Treating Bethaney Oshana/Extender: Frann Rider in Treatment: 14 Clinic Level of Care Assessment Items TOOL 4 Quantity Score X - Use when only an EandM is performed on FOLLOW-UP visit 1  0 ASSESSMENTS - Nursing Assessment / Reassessment X - Reassessment of Co-morbidities (includes updates in patient status) 1 10 X - Reassessment of Adherence to Treatment Plan 1 5 ASSESSMENTS - Wound and Skin Assessment / Reassessment []  - Simple Wound Assessment / Reassessment - one wound 0 X - Complex Wound Assessment / Reassessment - multiple wounds 2 5 []  - Dermatologic / Skin Assessment (not related to wound area) 0 ASSESSMENTS - Focused Assessment []  - Circumferential Edema Measurements - multi extremities 0 []  - Nutritional Assessment / Counseling / Intervention 0 []  - Lower Extremity Assessment (monofilament, tuning fork, pulses) 0 []  - Peripheral Arterial Disease Assessment (using hand held doppler) 0 ASSESSMENTS - Ostomy and/or Continence Assessment and Care []  - Incontinence Assessment and Management 0 []  - Ostomy Care Assessment and Management (repouching, etc.) 0 PROCESS - Coordination of Care []  - Simple Patient / Family Education for ongoing care 0 X - Complex (extensive) Patient / Family Education for ongoing care 1 20 X - Staff obtains Programmer, systems, Records, Test Results / Process Orders 1 10 X - Staff telephones HHA, Nursing Homes / Clarify orders / etc 1 10 []  - Routine Transfer to another Facility (non-emergent condition) 0 [name], [name] ([id number]) []  - Routine Hospital Admission (non-emergent condition) 0 []  - New Admissions / Biomedical engineer / Ordering NPWT, Apligraf, etc. 0 []  - Emergency Hospital Admission (emergent condition) 0 X - Simple Discharge Coordination 1 10 []  - Complex (extensive) Discharge Coordination 0 PROCESS - Special Needs []  - Pediatric / Minor Patient Management 0 []  - Isolation Patient Management 0 []  - Hearing / Language / Visual special needs 0 []  - Assessment of Community assistance (transportation, D/C planning, etc.) 0 []  - Additional assistance / Altered mentation 0 []  - Support Surface(s) Assessment (bed, cushion, seat,  etc.) 0 INTERVENTIONS - Wound Cleansing / Measurement []  - Simple Wound Cleansing - one wound 0 X - Complex Wound Cleansing - multiple wounds 2 5 X -  Wound Imaging (photographs - any number of wounds) 1 5 []  - Wound Tracing (instead of photographs) 0 []  - Simple Wound Measurement - one wound 0 X - Complex Wound Measurement - multiple wounds 2 5 INTERVENTIONS - Wound Dressings X - Small Wound Dressing one or multiple wounds 1 10 X - Medium Wound Dressing one or multiple wounds 1 15 []  - Large Wound Dressing one or multiple wounds 0 X - Application of Medications - topical 1 5 []  - Application of Medications - injection 0 INTERVENTIONS - Miscellaneous []  - External ear exam 0 [name], [name] ([id number]) []  - Specimen Collection (cultures, biopsies, blood, body fluids, etc.) 0 []  - Specimen(s) / Culture(s) sent or taken to Lab for analysis 0 []  - Patient Transfer (multiple staff / Harrel Lemon Lift / Similar devices) 0 []  - Simple Staple / Suture removal (25 or less) 0 []  - Complex Staple / Suture removal (26 or more) 0 []  - Hypo / Hyperglycemic Management (close monitor of Blood Glucose) 0 []  - Ankle / Brachial Index (ABI) - do not check if billed separately 0 X - Vital Signs 1 5 Has the patient been seen at the hospital within the last three years: Yes Total Score: 135 Level Of Care: New/Established - Level 4 Electronic Signature(s) Signed: 05/01/2017 5:25:16 PM By: Alric Quan Entered By: Alric Quan on 05/01/2017 15:31:22 [name], [name] ([id number]) -------------------------------------------------------------------------------- Encounter Discharge Information Details Patient Name: [name]. Date of Service: 05/01/2017 1:30 PM Medical Record Number: [id number] Patient Account Number: [id number] Date of Birth/Sex: 11/10/1966 (51 y.o. Female) Treating RN: Carolyne Fiscal, Debi Primary Care Cai Flott: [name] Other Clinician: Referring Shelanda Duvall: [name] Treating  Gioia Ranes/Extender: Frann Rider in Treatment: 14 Encounter Discharge Information Items Discharge Pain Level: 8 Discharge Condition: Stable Ambulatory Status: Walker Discharge Destination: Home Transportation: Private Auto Accompanied By: husband Schedule Follow-up Appointment: Yes Medication Reconciliation completed No and provided to Patient/Care Nate Perri: Provided on Clinical Summary of Care: 05/01/2017 Form Type Recipient Paper Patient RM Electronic Signature(s) Signed: 05/01/2017 2:11:31 PM By: Ruthine Dose Entered By: Ruthine Dose on 05/01/2017 14:11:31 [place], [name] ([id number]) -------------------------------------------------------------------------------- Lower Extremity Assessment Details Patient Name: [name]. Date of Service: 05/01/2017 1:30 PM Medical Record Number: [id number] Patient Account Number: [id number] Date of Birth/Sex: 04-16-1966 (51 y.o. Female) Treating RN: Carolyne Fiscal, Debi Primary Care Adrieanna Boteler: [name] Other Clinician: Referring Caliann Leckrone: [name] Treating Zaria Taha/Extender: Frann Rider in Treatment: 14 Vascular Assessment Pulses: Dorsalis Pedis Palpable: [Right:Yes] Posterior Tibial Extremity colors, hair growth, and conditions: Extremity Color: [Right:Normal] Temperature of Extremity: [Right:Warm] Capillary Refill: [Right:< 3 seconds] Toe Nail Assessment Left: Right: Thick: Yes Discolored: Yes Deformed: Yes Improper Length and Hygiene: Yes Electronic Signature(s) Signed: 05/01/2017 5:25:16 PM By: Alric Quan Entered By: Alric Quan on 05/01/2017 13:47:31 [name], [name] ([id number]) -------------------------------------------------------------------------------- Multi Wound Chart Details Patient Name: [name]. Date of Service: 05/01/2017 1:30 PM Medical Record Number: [id number] Patient Account Number: [id number] Date of Birth/Sex: 1966/05/14 (51 y.o. Female) Treating RN: Carolyne Fiscal,  Debi Primary Care Alaynah Schutter: [name] Other Clinician: Referring Hydee Fleece: [name] Treating Joseh Sjogren/Extender: Frann Rider in Treatment: 14 Vital Signs Height(in): 67 Pulse(bpm): 97 Weight(lbs): 137 Blood Pressure 82/62 (mmHg): Body Mass Index(BMI): 21 Temperature(F): 98.7 Respiratory Rate 16 (breaths/min): Photos: [1:No Photos] [2:No Photos] [N/A:N/A] Wound Location: [1:Right Malleolus - Lateral] [2:Right Gluteus] [N/A:N/A] Wounding Event: [1:Gradually Appeared] [2:Gradually Appeared] [N/A:N/A] Primary Etiology: [1:Diabetic Wound/Ulcer of the Lower Extremity] [2:Open Surgical Wound] [N/A:N/A] Comorbid History: [1:Anemia, Arrhythmia, Coronary Artery Disease, Hypotension, Peripheral  Arterial Disease, Type II Diabetes, Osteoarthritis] [2:Anemia, Arrhythmia, Coronary Artery Disease, Hypotension, Peripheral Arterial Disease, Type II Diabetes,  Osteoarthritis] [N/A:N/A] Date Acquired: [1:09/23/2016] [2:09/18/2016] [N/A:N/A] Weeks of Treatment: [1:14] [2:14] [N/A:N/A] Wound Status: [1:Open] [2:Open] [N/A:N/A] Measurements L x W x D 1.2x1.7x0.2 [2:2.5x1.6x2.2] [N/A:N/A] (cm) Area (cm) : [1:1.602] [2:3.142] [N/A:N/A] Volume (cm) : [1:0.32] [4:0.981] [N/A:N/A] % Reduction in Area: [1:59.20%] [2:83.70%] [N/A:N/A] % Reduction in Volume: 83.70% [2:92.80%] [N/A:N/A] Starting Position 1 12 (o'clock): Ending Position 1 [1:12] (o'clock): Maximum Distance 1 0.7 (cm): Undermining: [1:Yes] [2:No] [N/A:N/A] Classification: [1:Grade 2] [2:Full Thickness Without Exposed Support Structures] [N/A:N/A] Exudate Amount: [1:Large] [2:Large] [N/A:N/A] Exudate Type: Purulent Serous N/A Exudate Color: yellow, brown, green amber N/A Wound Margin: Distinct, outline attached Distinct, outline attached N/A Granulation Amount: Large (67-100%) Medium (34-66%) N/A Granulation Quality: Pink Red, Pink N/A Necrotic Amount: Small (1-33%) Medium (34-66%) N/A Exposed Structures: Fat Layer  (Subcutaneous N/A N/A Tissue) Exposed: Yes Fascia: No Tendon: No Muscle: No Joint: No Bone: No Epithelialization: None None N/A Periwound Skin Texture: Induration: Yes No Abnormalities Noted N/A Excoriation: No Callus: No Crepitus: No Rash: No Scarring: No Periwound Skin Maceration: Yes Maceration: Yes N/A Moisture: Dry/Scaly: No Periwound Skin Color: Atrophie Blanche: No No Abnormalities Noted N/A Cyanosis: No Ecchymosis: No Erythema: No Hemosiderin Staining: No Mottled: No Pallor: No Rubor: No Temperature: No Abnormality No Abnormality N/A Tenderness on Yes Yes N/A Palpation: Wound Preparation: Ulcer Cleansing: Ulcer Cleansing: N/A Rinsed/Irrigated with Rinsed/Irrigated with Saline Saline Topical Anesthetic Topical Anesthetic Applied: Other: lidocaine Applied: Other: lidocaine 4% 4% Treatment Notes Wound #1 (Right, Lateral Malleolus) 1. Cleansed with: Clean wound with Normal Saline 2. Anesthetic Topical Lidocaine 4% cream to wound bed prior to debridement 3. Peri-wound Care: Skin Prep [name], [name] ([id number]) 4. Dressing Applied: Prisma Ag 5. Secondary Dressing Applied Bordered Foam Dressing Dry Gauze Wound #2 (Right Gluteus) 1. Cleansed with: Clean wound with Normal Saline 2. Anesthetic Topical Lidocaine 4% cream to wound bed prior to debridement 3. Peri-wound Care: Skin Prep 4. Dressing Applied: Prisma Ag 5. Secondary Dressing Applied Bordered Foam Dressing Dry Gauze Electronic Signature(s) Signed: 05/01/2017 2:13:36 PM By: Christin Fudge MD, FACS Entered By: Christin Fudge on 05/01/2017 14:13:36 Lefkowitz, [name] ([id number]) -------------------------------------------------------------------------------- [place] Details Patient Name: [name], [name]. Date of Service: 05/01/2017 1:30 PM Medical Record Number: [id number] Patient Account Number: [id number] Date of Birth/Sex: 1966-01-29 (51 y.o. Female) Treating RN:  Carolyne Fiscal, Debi Primary Care Mikalah Skyles: [name] Other Clinician: Referring Rhea Kaelin: [name] Treating Lylie Blacklock/Extender: Frann Rider in Treatment: 14 Active Inactive ` Orientation to the Wound Care Program Nursing Diagnoses: Knowledge deficit related to the wound healing center program Goals: Patient/caregiver will verbalize understanding of the [place] Program Date Initiated: 01/23/2017 Target Resolution Date: 05/25/2017 Goal Status: Active Interventions: Provide education on orientation to the wound center Notes: ` Peripheral Neuropathy Nursing Diagnoses: Knowledge deficit related to disease process and management of peripheral neurovascular dysfunction Potential alteration in peripheral tissue perfusion (select prior to confirmation of diagnosis) Goals: Patient/caregiver will verbalize understanding of disease process and disease management Date Initiated: 01/23/2017 Target Resolution Date: 05/25/2017 Goal Status: Active Interventions: Assess signs and symptoms of neuropathy upon admission and as needed Provide education on Management of Neuropathy and Related Ulcers Provide education on Management of Neuropathy upon discharge from the [facility] Activities: Test ordered outside of clinic : 01/23/2017 Notes: SHARDAY, MICHL. ([id number]) ` Pressure Nursing Diagnoses: Knowledge deficit related to causes and risk factors for pressure ulcer development Knowledge deficit  related to management of pressures ulcers Potential for impaired tissue integrity related to pressure, friction, moisture, and shear Goals: Patient will remain free from development of additional pressure ulcers Date Initiated: 01/23/2017 Target Resolution Date: 05/25/2017 Goal Status: Active Patient will remain free of pressure ulcers Date Initiated: 01/23/2017 Target Resolution Date: 05/25/2017 Goal Status: Active Patient/caregiver will verbalize risk factors for pressure  ulcer development Date Initiated: 01/23/2017 Target Resolution Date: 05/25/2017 Goal Status: Active Patient/caregiver will verbalize understanding of pressure ulcer management Date Initiated: 01/23/2017 Target Resolution Date: 05/25/2017 Goal Status: Active Interventions: Assess: immobility, friction, shearing, incontinence upon admission and as needed Assess offloading mechanisms upon admission and as needed Assess potential for pressure ulcer upon admission and as needed Provide education on pressure ulcers Treatment Activities: Patient referred for pressure reduction/relief devices : 01/23/2017 Patient referred for seating evaluation to ensure proper offloading : 01/23/2017 Pressure reduction/relief device ordered : 01/23/2017 Notes: ` Wound/Skin Impairment Nursing Diagnoses: Impaired tissue integrity Knowledge deficit related to smoking impact on wound healing Knowledge deficit related to ulceration/compromised skin integrity Goals: Patient/caregiver will verbalize understanding of skin care regimen [place], [place] ([id number]) Date Initiated: 01/23/2017 Target Resolution Date: 05/25/2017 Goal Status: Active Ulcer/skin breakdown will have a volume reduction of 30% by week 4 Date Initiated: 01/23/2017 Target Resolution Date: 05/25/2017 Goal Status: Active Ulcer/skin breakdown will have a volume reduction of 50% by week 8 Date Initiated: 01/23/2017 Target Resolution Date: 05/25/2017 Goal Status: Active Ulcer/skin breakdown will have a volume reduction of 80% by week 12 Date Initiated: 01/23/2017 Target Resolution Date: 05/25/2017 Goal Status: Active Ulcer/skin breakdown will heal within 14 weeks Date Initiated: 01/23/2017 Target Resolution Date: 05/25/2017 Goal Status: Active Interventions: Assess patient/caregiver ability to obtain necessary supplies Assess patient/caregiver ability to perform ulcer/skin care regimen upon admission and as needed Assess ulceration(s) every visit Provide education  on smoking Provide education on ulcer and skin care Treatment Activities: Patient referred to home care : 01/23/2017 Referred to DME Vannary Greening for dressing supplies : 01/23/2017 Skin care regimen initiated : 01/23/2017 Topical wound management initiated : 01/23/2017 Notes: Electronic Signature(s) Signed: 05/01/2017 5:25:16 PM By: Alric Quan Entered By: Alric Quan on 05/01/2017 13:47:37 [name], [name] ([id number]) -------------------------------------------------------------------------------- Pain Assessment Details Patient Name: [name]. Date of Service: 05/01/2017 1:30 PM Medical Record Number: [id number] Patient Account Number: [id number] Date of Birth/Sex: 24-Apr-1966 (51 y.o. Female) Treating RN: Carolyne Fiscal, Debi Primary Care Tarell Schollmeyer: [name] Other Clinician: Referring Dwanna Goshert: [name] Treating Shashwat Cleary/Extender: Frann Rider in Treatment: 14 Active Problems Location of Pain Severity and Description of Pain Patient Has Paino Yes Site Locations Pain Location: Pain in Ulcers With Dressing Change: Yes Rate the pain. Current Pain Level: 10 Character of Pain Describe the Pain: Aching, Burning, Tender, Throbbing Pain Management and Medication Current Pain Management: Electronic Signature(s) Signed: 05/01/2017 5:25:16 PM By: Alric Quan Entered By: Alric Quan on 05/01/2017 13:37:10 Pata, [name] ([id number]) -------------------------------------------------------------------------------- Patient/Caregiver Education Details Patient Name: [name]. Date of Service: 05/01/2017 1:30 PM Medical Record Number: [id number] Patient Account Number: [id number] Date of Birth/Gender: Sep 17, 1966 (51 y.o. Female) Treating RN: Carolyne Fiscal, Debi Primary Care Physician: [name] Other Clinician: Referring Physician: Delight [name] Treating Physician/Extender: Frann Rider in Treatment: 14 Education Assessment Education Provided  To: Patient Education Topics Provided Wound/Skin Impairment: Handouts: Other: change dressing as ordered Methods: Demonstration, Explain/Verbal Responses: State content correctly Electronic Signature(s) Signed: 05/01/2017 5:25:16 PM By: Alric Quan Entered By: Alric Quan on 05/01/2017 13:57:41 [name], [name] ([id number]) -------------------------------------------------------------------------------- Wound Assessment Details Patient Name:  [name], [name] Date of Service: 05/01/2017 1:30 PM Medical Record Number: [id number] Patient Account Number: [id number] Date of Birth/Sex: September 20, 1966 (51 y.o. Female) Treating RN: Carolyne Fiscal, Debi Primary Care Otniel Hoe: [name] Other Clinician: Referring Iker Nuttall: [name] Treating Caydee Talkington/Extender: Frann Rider in Treatment: 14 Wound Status Wound Number: 1 Primary Diabetic Wound/Ulcer of the Lower Etiology: Extremity Wound Location: Right Malleolus - Lateral Wound Open Wounding Event: Gradually Appeared Status: Date Acquired: 09/23/2016 Comorbid Anemia, Arrhythmia, Coronary Artery Weeks Of Treatment: 14 History: Disease, Hypotension, Peripheral Clustered Wound: No Arterial Disease, Type II Diabetes, Osteoarthritis Photos Photo Uploaded By: Montey Hora on 05/01/2017 16:35:12 Wound Measurements Length: (cm) 1.2 Width: (cm) 1.7 Depth: (cm) 0.2 Area: (cm) 1.602 Volume: (cm) 0.32 % Reduction in Area: 59.2% % Reduction in Volume: 83.7% Epithelialization: None Tunneling: No Undermining: Yes Starting Position (o'clock): 12 Ending Position (o'clock): 12 Maximum Distance: (cm) 0.7 Wound Description Classification: Grade 2 Wound Margin: Distinct, outline attached Exudate Amount: Large Exudate Type: Purulent Exudate Color: yellow, brown, green [name], Sierah S. ([id number]) Foul Odor After Cleansing: No Slough/Fibrino Yes Wound Bed Granulation Amount: Large (67-100%) Exposed Structure Granulation  Quality: Pink Fascia Exposed: No Necrotic Amount: Small (1-33%) Fat Layer (Subcutaneous Tissue) Exposed: Yes Necrotic Quality: Adherent Slough Tendon Exposed: No Muscle Exposed: No Joint Exposed: No Bone Exposed: No Periwound Skin Texture Texture Color No Abnormalities Noted: No No Abnormalities Noted: No Callus: No Atrophie Blanche: No Crepitus: No Cyanosis: No Excoriation: No Ecchymosis: No Induration: Yes Erythema: No Rash: No Hemosiderin Staining: No Scarring: No Mottled: No Pallor: No Moisture Rubor: No No Abnormalities Noted: No Dry / Scaly: No Temperature / Pain Maceration: Yes Temperature: No Abnormality Tenderness on Palpation: Yes Wound Preparation Ulcer Cleansing: Rinsed/Irrigated with Saline Topical Anesthetic Applied: Other: lidocaine 4%, Treatment Notes Wound #1 (Right, Lateral Malleolus) 1. Cleansed with: Clean wound with Normal Saline 2. Anesthetic Topical Lidocaine 4% cream to wound bed prior to debridement 3. Peri-wound Care: Skin Prep 4. Dressing Applied: Prisma Ag 5. Secondary Dressing Applied Bordered Foam Dressing Dry Gauze Electronic Signature(s) Signed: 05/01/2017 5:25:16 PM By: Alric Quan Entered By: Alric Quan on 05/01/2017 13:59:43 Kolasinski, Ciin S. ([id number]) [place], [place] ([id number]) -------------------------------------------------------------------------------- Wound Assessment Details Patient Name: [name], [name] Date of Service: 05/01/2017 1:30 PM Medical Record Number: [id number] Patient Account Number: [id number] Date of Birth/Sex: 1966-03-13 (51 y.o. Female) Treating RN: Carolyne Fiscal, Debi Primary Care Arshdeep Bolger: [name] Other Clinician: Referring Ashani Pumphrey: [name] Treating Oday Ridings/Extender: Frann Rider in Treatment: 14 Wound Status Wound Number: 2 Primary Open Surgical Wound Etiology: Wound Location: Right Gluteus Wound Open Wounding Event: Gradually Appeared Status: Date  Acquired: 09/18/2016 Comorbid Anemia, Arrhythmia, Coronary Artery Weeks Of Treatment: 14 History: Disease, Hypotension, Peripheral Clustered Wound: No Arterial Disease, Type II Diabetes, Osteoarthritis Photos Photo Uploaded By: Montey Hora on 05/01/2017 16:35:55 Wound Measurements Length: (cm) 2.5 Width: (cm) 1.6 Depth: (cm) 2.2 Area: (cm) 3.142 Volume: (cm) 6.912 % Reduction in Area: 83.7% % Reduction in Volume: 92.8% Epithelialization: None Tunneling: No Undermining: No Wound Description Full Thickness Without Exposed Classification: Support Structures Wound Margin: Distinct, outline attached Exudate Large Amount: Exudate Type: Serous Exudate Color: amber Foul Odor After Cleansing: No Slough/Fibrino No Wound Bed Granulation Amount: Medium (34-66%) Ash[place], Leva S. ([id number]) Granulation Quality: Red, Pink Necrotic Amount: Medium (34-66%) Necrotic Quality: Adherent Slough Periwound Skin Texture Texture Color No Abnormalities Noted: No No Abnormalities Noted: No Moisture Temperature / Pain No Abnormalities Noted: No Temperature: No Abnormality Maceration: Yes Tenderness on Palpation: Yes Wound Preparation Ulcer Cleansing:  Rinsed/Irrigated with Saline Topical Anesthetic Applied: Other: lidocaine 4%, Treatment Notes Wound #2 (Right Gluteus) 1. Cleansed with: Clean wound with Normal Saline 2. Anesthetic Topical Lidocaine 4% cream to wound bed prior to debridement 3. Peri-wound Care: Skin Prep 4. Dressing Applied: Prisma Ag 5. Secondary Dressing Applied Bordered Foam Dressing Dry Gauze Electronic Signature(s) Signed: 05/01/2017 5:25:16 PM By: Alric Quan Entered By: Alric Quan on 05/01/2017 13:45:43 Zollinger, Janilah S. ([id number]) -------------------------------------------------------------------------------- [place] Details Patient Name: [name]. Date of Service: 05/01/2017 1:30 PM Medical Record Number: [id number] Patient  Account Number: [id number] Date of Birth/Sex: 1966-01-13 (51 y.o. Female) Treating RN: Carolyne Fiscal, Debi Primary Care Takeela Peil: [name] Other Clinician: Referring Tajah Noguchi: [name] Treating Aryannah Mohon/Extender: Frann Rider in Treatment: 14 Vital Signs Time Taken: 13:37 Temperature (F): 98.7 Height (in): 67 Pulse (bpm): 97 Weight (lbs): 137 Respiratory Rate (breaths/min): 16 Body Mass Index (BMI): 21.5 Blood Pressure (mmHg): 82/62 Reference Range: 80 - 120 mg / dl Electronic Signature(s) Signed: 05/01/2017 5:25:16 PM By: Alric Quan Entered By: Alric Quan on 05/01/2017 13:39:17

## 2017-05-03 NOTE — Progress Notes (Signed)
Jamie, [name] ([id number]) Visit Report for 05/01/2017 Chief Complaint Document Details Patient Name: [name], [name]. Date of Service: 05/01/2017 1:30 PM Medical Record Number: [id number] Patient Account Number: [id number] Date of Birth/Sex: 05/28/1966 (51 y.o. Female) Treating RN: Carolyne Fiscal, Debi Primary Care Provider: Delight Stare Other Clinician: Referring Provider: Delight Stare Treating Provider/Extender: Frann Rider in Treatment: 14 Information Obtained from: Patient Chief Complaint Patients presents for treatment of an open diabetic ulcer with an arterial etiology on her right lateral ankle and right gluteal area for about 4 months Electronic Signature(s) Signed: 05/01/2017 2:13:46 PM By: Christin Fudge MD, FACS Entered By: Christin Fudge on 05/01/2017 14:13:46 Colpitts, [name] ([id number]) -------------------------------------------------------------------------------- HPI Details Patient Name: [name]. Date of Service: 05/01/2017 1:30 PM Medical Record Number: [id number] Patient Account Number: [id number] Date of Birth/Sex: 1966/01/19 (51 y.o. Female) Treating RN: Carolyne Fiscal, Debi Primary Care Provider: Delight Stare Other Clinician: Referring Provider: Delight Stare Treating Provider/Extender: Frann Rider in Treatment: 14 History of Present Illness Location: right lateral ankle and right gluteal area Quality: Patient reports experiencing a sharp pain to affected area(s). Severity: Patient states wound are getting worse. Duration: Patient has had the wound for > 4 months prior to seeking treatment at the wound center Timing: Pain in wound is constant (hurts all the time) Context: The wound occurred when the patient was in hospital with a necrotizing fasciitis of the right gluteal area and a ulceration on the right ankle Modifying Factors: Other treatment(s) tried include:admitted to the hospital for IV antibiotics and a full workup and has also had a  recent angiogram Associated Signs and Symptoms: Patient reports having increase discharge. HPI Description: 51 year old patient was sent to [place] from [facility] where she was seen by Dr. Sherril Cong for a left ankle ulceration and was recently hospitalized with hypotension and sepsis. She was treated with IV antibiotics and has been scheduled to see Dr. Sharol Given. She was seen by vascular surgery who recommended a femoral bypass but surgery has been delayed until her sacral wound from last year's necrotizing fasciitis has healed. Was seeing the wound care team at [facility] but wanted to change over. She is a smoker and smokes a pack of cigarettes a day The patient was recently admitted in [place] between February 2 and February 14. She had a follow- up to see vascular surgery, orthopedic surgery and infectious disease. 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 Review of the electronic medical records reveals that Dr. 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 An MRI showed edema in the bone which was consistent with osteomyelitis and the chronic ulcer and may need surgical intervention. She also had a sacral decubitus ulcer which was treated with the wound VAC. On 01/07/2017 she was taken up by the vascular surgeon Dr. Trula Slade for an abdominal aortogram and bilateral lower extremity runoff, for a history of having bilateral femoropopliteal bypass graft as well as external iliac stenting on the right and stenting of her bypass graft. She had developed a nonhealing  wound on her right ankle and there was a possibility of a femoral occlusion. the findings were  noted and the impression was a surgical revascularization with a aorto bifemoral bypass graft. Both the femoropopliteal bypass grafts were patent. 01/31/2017 -- x-ray of the right hip and pelvis -- IMPRESSION:No radiographic evidence of acute osteomyelitis. Normal-appearing right hip joint space for age. No acute bony abnormality of the hip. 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 Since seeing her last week she has not had an appointment with infectious disease or the orthopedic specialist yet. Jamie, [name] ([id number]) 02/06/2017 -- the patient missed a couple of appointments yesterday due to the weather but other than that has apparently given up smoking for the last 5 days. 02/13/2017 -- she has rescheduled her infectious disease appointment and also the appointment with the orthopedic surgeon Dr. Sharol Given 03/06/2017 -- she was seen by Dr. Lucianne Lei dam of infectious disease on 03/05/2017 and he recommendedIV antibiotics but the patient did not want to have that and he has given her Augmentin and doxycycline and will reevaluate her in 2 months time. 03/13/2017 -- he saw Dr. Trula Slade regarding her vascular issues and he would like to wait to the sacral wound is completely closed before considering aortobifemoral bypass graft. He will see her back in 2 months time. She was also seen by Dr. Sharol Given of orthopedics who recommended continue wound care dressings and follow in the office in about 2 months time. he also recommended 3 view radiographs of the right ankle at follow-up. 03/28/2017 -- she did have a PICC line placed and Dr. Lucianne Lei dam has begun IV vancomycin and ceftriaxone. she is going to continue this until she sees him in approximately a month's time 04/18/2017 -- we had applied for a skin substitute for the patient's care but her copayment  is going to be about $300 a piece and the patient will not be able to afford this. Electronic Signature(s) Signed: 05/01/2017 2:13:51 PM By: Christin Fudge MD, FACS Entered By: Christin Fudge on 05/01/2017 14:13:51 Trang, [name] ([id number]) -------------------------------------------------------------------------------- Physical Exam Details Patient Name: ATZIRY, BARANSKI S. Date of Service: 05/01/2017 1:30 PM Medical Record Number: [id number] Patient Account Number: [id number] Date of Birth/Sex: 1966/03/28 (51 y.o. Female) Treating RN: Carolyne Fiscal, Debi Primary Care Provider: Delight Stare Other Clinician: Referring Provider: Delight Stare Treating Provider/Extender: Frann Rider in Treatment: 14 Constitutional . Pulse regular. Respirations normal and unlabored. Afebrile. . Eyes Nonicteric. Reactive to light. Ears, Nose, Mouth, and Throat Lips, teeth, and gums WNL.Marland Kitchen Moist mucosa without lesions. Neck supple and nontender. No palpable supraclavicular or cervical adenopathy. Normal sized without goiter. Respiratory WNL. No retractions.. Cardiovascular Pedal Pulses WNL. No clubbing, cyanosis or edema. Lymphatic No adneopathy. No adenopathy. No adenopathy. Musculoskeletal Adexa without tenderness or enlargement.. Digits and nails w/o clubbing, cyanosis, infection, petechiae, ischemia, or inflammatory conditions.. Integumentary (Hair, Skin) No suspicious lesions. No crepitus or fluctuance. No peri-wound warmth or erythema. No masses.Marland Kitchen Psychiatric Judgement and insight Intact.. No evidence of depression, anxiety, or agitation.. Notes with a moist gauze and Q-tip the right ankle wound was debrided of all the residual dressing and debris and no sharp debridement was required. The right gluteal wound continues to have a lot of tenderness and minimal maceration. Electronic Signature(s) Signed: 05/01/2017 2:14:32 PM By: Christin Fudge MD, FACS Entered By: Christin Fudge on 05/01/2017  14:14:32 Troung, [name] ([id number]) -------------------------------------------------------------------------------- Physician Orders Details Patient Name: SABAH, ZUCCO. Date of Service: 05/01/2017 1:30 PM Medical Record Number: [id number] Patient Account Number: [id number] Date of Birth/Sex:  1966-04-15 (51 y.o. Female) Treating RN: Carolyne Fiscal, Debi Primary Care Provider: Delight Stare Other Clinician: Referring Provider: Delight Stare Treating Provider/Extender: Frann Rider in Treatment: 14 Verbal / Phone Orders: Yes Clinician: Carolyne Fiscal, Debi Read Back and Verified: Yes Diagnosis Coding Wound Cleansing Wound #1 Right,Lateral Malleolus o Clean wound with Normal Saline. Wound #2 Right Gluteus o Clean wound with Normal Saline. Anesthetic Wound #1 Right,Lateral Malleolus o Topical Lidocaine 4% cream applied to wound bed prior to debridement - in clinic Wound #2 Right Gluteus o Topical Lidocaine 4% cream applied to wound bed prior to debridement - in clinic Skin Barriers/Peri-Wound Care Wound #1 Right,Lateral Malleolus o Skin Prep Wound #2 Right Gluteus o Skin Prep Primary Wound Dressing Wound #1 Right,Lateral Malleolus o Prisma Ag - please pack into the undermining's. Wound #2 Right Gluteus o Prisma Ag - please pack into the undermining's. Secondary Dressing Wound #1 Right,Lateral Malleolus o Dry Gauze o Boardered Foam Dressing Wound #2 Right Gluteus o Dry Gauze o Boardered Foam Dressing - in clinic until [place], [place]. ([id number]) Dressing Change Frequency Wound #1 Right,Lateral Malleolus o Change dressing every day. Wound #2 Right Gluteus o Change Dressing Monday, Wednesday, Friday Follow-up Appointments Wound #1 Right,Lateral Malleolus o Return Appointment in 1 week. Wound #2 Right Gluteus o Return Appointment in 1 week. Additional Orders / Instructions Wound #1 Right,Lateral Malleolus o Stop  Smoking o Increase protein intake. o Activity as tolerated o Other: - Please add vitamin A, vitamin C and zinc supplements to your diet Wound #2 Right Gluteus o Stop Smoking o Increase protein intake. o Activity as tolerated o Other: - Please add vitamin A, vitamin C and zinc supplements to your diet Home Health Wound #1 [place] Visits - [place] Nurse may visit PRN to address patientos wound care needs. o FACE TO FACE ENCOUNTER: MEDICARE and MEDICAID PATIENTS: I certify that this patient is under my care and that I had a face-to-face encounter that meets the physician face-to-face encounter requirements with this patient on this date. The encounter with the patient was in whole or in part for the following MEDICAL CONDITION: (primary reason for [place]) MEDICAL NECESSITY: I certify, that based on my findings, NURSING services are a medically necessary home health service. HOME BOUND STATUS: I certify that my clinical findings support that this patient is homebound (i.e., Due to illness or injury, pt requires aid of supportive devices such as crutches, cane, wheelchairs, walkers, the use of special transportation or the assistance of another person to leave their place of residence. There is a normal inability to leave the home and doing so requires considerable and taxing effort. Other absences are for medical reasons / religious services and are infrequent or of short duration when for other reasons). o If current dressing causes regression in wound condition, may D/C ordered dressing product/s and apply Normal Saline Moist Dressing daily until next [place] / Other MD appointment. [place] of regression in wound condition at [phone]. ROGUE, PAUTLER ([id number]) o Please direct any NON-WOUND related issues/requests for orders to patient's Primary  Care Physician Wound #2 Right [place] Visits - [place] Nurse may visit PRN to address patientos wound care needs. o FACE TO FACE ENCOUNTER: MEDICARE and MEDICAID PATIENTS: I certify that this patient is under my care and that I had a face-to-face encounter that meets the physician  face-to-face encounter requirements with this patient on this date. The encounter with the patient was in whole or in part for the following MEDICAL CONDITION: (primary reason for [place]) MEDICAL NECESSITY: I certify, that based on my findings, NURSING services are a medically necessary home health service. HOME BOUND STATUS: I certify that my clinical findings support that this patient is homebound (i.e., Due to illness or injury, pt requires aid of supportive devices such as crutches, cane, wheelchairs, walkers, the use of special transportation or the assistance of another person to leave their place of residence. There is a normal inability to leave the home and doing so requires considerable and taxing effort. Other absences are for medical reasons / religious services and are infrequent or of short duration when for other reasons). o If current dressing causes regression in wound condition, may D/C ordered dressing product/s and apply Normal Saline Moist Dressing daily until next [place] / Other MD appointment. [place] of regression in wound condition at [phone]. o Please direct any NON-WOUND related issues/requests for orders to patient's Primary Care Physician Negative Pressure Wound Therapy Wound #2 Right Gluteus o Wound VAC settings at 125/130 mmHg continuous pressure. Use BLACK/GREEN foam to wound cavity. Use WHITE foam to fill any tunnel/s and/or undermining. Change VAC dressing 3 X WEEK. Change canister as indicated when full. Nurse may titrate settings and frequency of dressing changes as  clinically indicated. - black foam to wound cavity only; BRIDGE TO RIGHT HIP. Electronic Signature(s) Signed: 05/01/2017 4:29:00 PM By: Christin Fudge MD, FACS Signed: 05/01/2017 5:25:16 PM By: Alric Quan Entered By: Alric Quan on 05/01/2017 14:10:55 Pritchard, [name] ([id number]) -------------------------------------------------------------------------------- Problem List Details Patient Name: ARAIYAH, CUMPTON. Date of Service: 05/01/2017 1:30 PM Medical Record Number: [id number] Patient Account Number: [id number] Date of Birth/Sex: 1966-09-07 (51 y.o. Female) Treating RN: Carolyne Fiscal, Debi Primary Care Provider: Delight Stare Other Clinician: Referring Provider: Delight Stare Treating Provider/Extender: Frann Rider in Treatment: 14 Active Problems ICD-10 Encounter Code Description Active Date Diagnosis E11.621 Type 2 diabetes mellitus with foot ulcer 01/23/2017 Yes L89.314 Pressure ulcer of right buttock, stage 4 01/23/2017 Yes L97.312 Non-pressure chronic ulcer of right ankle with fat layer 01/23/2017 Yes exposed F17.218 Nicotine dependence, cigarettes, with other nicotine- 01/23/2017 Yes induced disorders I70.233 Atherosclerosis of native arteries of right leg with 01/23/2017 Yes ulceration of ankle M86.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, right ankle and foot 01/23/2017 Yes Inactive Problems Resolved Problems Electronic Signature(s) Signed: 05/01/2017 2:13:31 PM By: Christin Fudge MD, FACS Entered By: Christin Fudge on 05/01/2017 14:13:31 Bramble, [name] ([id number]) -------------------------------------------------------------------------------- Progress Note Details Patient Name: [name]. Date of Service: 05/01/2017 1:30 PM Medical Record Number: [id number] Patient Account Number: [id number] Date of Birth/Sex: 20-Mar-1966 (51 y.o. Female) Treating RN: Carolyne Fiscal, Debi Primary Care Provider: Delight Stare Other Clinician: Referring Provider: Delight Stare Treating  Provider/Extender: Frann Rider in Treatment: 14 Subjective Chief Complaint Information obtained from Patient Patients presents for treatment of an open diabetic ulcer with an arterial etiology on her right lateral ankle and right gluteal area for about 4 months History of Present Illness (HPI) The following HPI elements were documented for the patient's wound: Location: right lateral ankle and right gluteal area Quality: Patient reports experiencing a sharp pain to affected area(s). Severity: Patient states wound are getting worse. Duration: Patient has had the wound for > 4 months prior to seeking treatment at the wound center Timing: Pain in wound is constant (hurts all the time) Context: The  wound occurred when the patient was in hospital with a necrotizing fasciitis of the right gluteal area and a ulceration on the right ankle Modifying Factors: Other treatment(s) tried include:admitted to the hospital for IV antibiotics and a full workup and has also had a recent angiogram Associated Signs and Symptoms: Patient reports having increase discharge. 51 year old patient was sent to [place] from [facility] where she was seen by Dr. Sherril Cong for a left ankle ulceration and was recently hospitalized with hypotension and sepsis. She was treated with IV antibiotics and has been scheduled to see Dr. Sharol Given. She was seen by vascular surgery who recommended a femoral bypass but surgery has been delayed until her sacral wound from last year's necrotizing fasciitis has healed. Was seeing the wound care team at [facility] but wanted to change over. She is a smoker and smokes a pack of cigarettes a day The patient was recently admitted in [place] between February 2 and February 14. She had a follow- up to see vascular surgery, orthopedic surgery and infectious disease. During her admission she was known to have peripheral arterial disease, diabetes mellitus with neuropathy, chronic pain,  open wound with necrotizing fasciitis of the sacral area which had been there since November 2017 Past medical history significant for diabetes mellitus, ankle ulcer, sacral ulcer, necrotizing fasciitis, arterial occlusive disease, tobacco abuse. Review of the electronic medical records reveals that Dr. 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 An MRI showed edema in the bone which was consistent with osteomyelitis and the chronic ulcer and may need surgical intervention. She also had a sacral decubitus ulcer which was treated with the wound VAC. On 01/07/2017 she was taken up by the vascular surgeon Dr. Trula Slade for an abdominal aortogram and bilateral lower extremity runoff, for a history of having bilateral femoropopliteal bypass graft as well as external iliac stenting on the right and stenting of her bypass graft. She had developed a nonhealing wound on her right ankle and there was a possibility of a femoral occlusion. the findings were noted and the BASSHEVA, FLURY. ([id number]) impression was a surgical revascularization with a aorto bifemoral bypass graft. Both the femoropopliteal bypass grafts were patent. 01/31/2017 -- x-ray of the right hip and pelvis -- IMPRESSION:No radiographic evidence of acute osteomyelitis. Normal-appearing right hip joint space for age. No acute bony abnormality of the hip. 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 Since seeing her last week she has not had an appointment with infectious disease or the orthopedic specialist yet. 02/06/2017 -- the patient missed a couple of appointments yesterday due to the weather but other than that has apparently given up smoking for the last 5 days. 02/13/2017 -- she has rescheduled her infectious disease appointment and also the appointment with  the orthopedic surgeon Dr. Sharol Given 03/06/2017 -- she was seen by Dr. Lucianne Lei dam of infectious disease on 03/05/2017 and he recommendedIV antibiotics but the patient did not want to have that and he has given her Augmentin and doxycycline and will reevaluate her in 2 months time. 03/13/2017 -- he saw Dr. Trula Slade regarding her vascular issues and he would like to wait to the sacral wound is completely closed before considering aortobifemoral bypass graft. He will see her back in 2 months time. She was also seen by Dr. Sharol Given of  orthopedics who recommended continue wound care dressings and follow in the office in about 2 months time. he also recommended 3 view radiographs of the right ankle at follow-up. 03/28/2017 -- she did have a PICC line placed and Dr. Lucianne Lei dam has begun IV vancomycin and ceftriaxone. she is going to continue this until she sees him in approximately a month's time 04/18/2017 -- we had applied for a skin substitute for the patient's care but her copayment is going to be about $300 a piece and the patient will not be able to afford this. Objective Constitutional Pulse regular. Respirations normal and unlabored. Afebrile. Vitals Time Taken: 1:37 PM, Height: 67 in, Weight: 137 lbs, BMI: 21.5, Temperature: 98.7 F, Pulse: 97 bpm, Respiratory Rate: 16 breaths/min, Blood Pressure: 82/62 mmHg. Eyes Nonicteric. Reactive to light. Ears, Nose, Mouth, and Throat Lips, teeth, and gums WNL.Marland Kitchen Moist mucosa without lesions. Neck supple and nontender. No palpable supraclavicular or cervical adenopathy. Normal sized without goiter. PENNEY, DOMANSKI. ([id number]) Respiratory WNL. No retractions.. Cardiovascular Pedal Pulses WNL. No clubbing, cyanosis or edema. Lymphatic No adneopathy. No adenopathy. No adenopathy. Musculoskeletal Adexa without tenderness or enlargement.. Digits and nails w/o clubbing, cyanosis, infection, petechiae, ischemia, or inflammatory  conditions.Marland Kitchen Psychiatric Judgement and insight Intact.. No evidence of depression, anxiety, or agitation.. General Notes: with a moist gauze and Q-tip the right ankle wound was debrided of all the residual dressing and debris and no sharp debridement was required. The right gluteal wound continues to have a lot of tenderness and minimal maceration. Integumentary (Hair, Skin) No suspicious lesions. No crepitus or fluctuance. No peri-wound warmth or erythema. No masses.. Wound #1 status is Open. Original cause of wound was Gradually Appeared. The wound is located on the Right,Lateral Malleolus. The wound measures 1.2cm length x 1.7cm width x 0.2cm depth; 1.602cm^2 area and 0.32cm^3 volume. There is Fat Layer (Subcutaneous Tissue) Exposed exposed. There is no tunneling noted, however, there is undermining starting at 12:00 and ending at 12:00 with a maximum distance of 0.7cm. There is a large amount of purulent drainage noted. The wound margin is distinct with the outline attached to the wound base. There is large (67-100%) pink granulation within the wound bed. There is a small (1-33%) amount of necrotic tissue within the wound bed including Adherent Slough. The periwound skin appearance exhibited: Induration, Maceration. The periwound skin appearance did not exhibit: Callus, Crepitus, Excoriation, Rash, Scarring, Dry/Scaly, Atrophie Blanche, Cyanosis, Ecchymosis, Hemosiderin Staining, Mottled, Pallor, Rubor, Erythema. Periwound temperature was noted as No Abnormality. The periwound has tenderness on palpation. Wound #2 status is Open. Original cause of wound was Gradually Appeared. The wound is located on the Right Gluteus. The wound measures 2.5cm length x 1.6cm width x 2.2cm depth; 3.142cm^2 area and 6.912cm^3 volume. There is no tunneling or undermining noted. There is a large amount of serous drainage noted. The wound margin is distinct with the outline attached to the wound base. There is  medium (34-66%) red, pink granulation within the wound bed. There is a medium (34-66%) amount of necrotic tissue within the wound bed including Adherent Slough. The periwound skin appearance exhibited: Maceration. Periwound temperature was noted as No Abnormality. The periwound has tenderness on palpation. Assessment CHAZLYN, CUDE ([id number]) Active Problems ICD-10 E11.621 - Type 2 diabetes mellitus with foot ulcer L89.314 - Pressure ulcer of right buttock, stage 4 L97.312 - Non-pressure chronic ulcer of right ankle with fat layer exposed F17.218 - Nicotine dependence, cigarettes, with other nicotine-induced disorders I70.233 - Atherosclerosis of native  arteries of right leg with ulceration of ankle M86.371 - Chronic multifocal osteomyelitis, right ankle and foot Plan Wound Cleansing: Wound #1 Right,Lateral Malleolus: Clean wound with Normal Saline. Wound #2 Right Gluteus: Clean wound with Normal Saline. Anesthetic: Wound #1 Right,Lateral Malleolus: Topical Lidocaine 4% cream applied to wound bed prior to debridement - in clinic Wound #2 Right Gluteus: Topical Lidocaine 4% cream applied to wound bed prior to debridement - in clinic Skin Barriers/Peri-Wound Care: Wound #1 Right,Lateral Malleolus: Skin Prep Wound #2 Right Gluteus: Skin Prep Primary Wound Dressing: Wound #1 Right,Lateral Malleolus: Prisma Ag - please pack into the undermining's. Wound #2 Right Gluteus: Prisma Ag - please pack into the undermining's. Secondary Dressing: Wound #1 Right,Lateral Malleolus: Dry Gauze Boardered Foam Dressing Wound #2 Right Gluteus: Dry Gauze Boardered Foam Dressing - in clinic until [facility] applies VAC Dressing Change Frequency: Wound #1 Right,Lateral Malleolus: Change dressing every day. Wound #2 Right Gluteus: Change Dressing Monday, Wednesday, Friday Follow-up Appointments: IRJA, WHELESS ([id number]) Wound #1 Right,Lateral Malleolus: Return Appointment in 1 week. Wound  #2 Right Gluteus: Return Appointment in 1 week. Additional Orders / Instructions: Wound #1 Right,Lateral Malleolus: Stop Smoking Increase protein intake. Activity as tolerated Other: - Please add vitamin A, vitamin C and zinc supplements to your diet Wound #2 Right Gluteus: Stop Smoking Increase protein intake. Activity as tolerated Other: - Please add vitamin A, vitamin C and zinc supplements to your diet Home Health: Wound #1 Right,Lateral Malleolus: [place] Visits - [place] Nurse may visit PRN to address patient s wound care needs. FACE TO FACE ENCOUNTER: MEDICARE and MEDICAID PATIENTS: I certify that this patient is under my care and that I had a face-to-face encounter that meets the physician face-to-face encounter requirements with this patient on this date. The encounter with the patient was in whole or in part for the following MEDICAL CONDITION: (primary reason for [place]) MEDICAL NECESSITY: I certify, that based on my findings, NURSING services are a medically necessary home health service. HOME BOUND STATUS: I certify that my clinical findings support that this patient is homebound (i.e., Due to illness or injury, pt requires aid of supportive devices such as crutches, cane, wheelchairs, walkers, the use of special transportation or the assistance of another person to leave their place of residence. There is a normal inability to leave the home and doing so requires considerable and taxing effort. Other absences are for medical reasons / religious services and are infrequent or of short duration when for other reasons). If current dressing causes regression in wound condition, may D/C ordered dressing product/s and apply Normal Saline Moist Dressing daily until next [place] / Other MD appointment. [place] of regression in wound condition at [phone]. Please direct any NON-WOUND related  issues/requests for orders to patient's Primary Care Physician Wound #2 Right Gluteus: [place] Nurse may visit PRN to address patient s wound care needs. FACE TO FACE ENCOUNTER: MEDICARE and MEDICAID PATIENTS: I certify that this patient is under my care and that I had a face-to-face encounter that meets the physician face-to-face encounter requirements with this patient on this date. The encounter with the patient was in whole or in part for the following MEDICAL CONDITION: (primary reason for [place]) MEDICAL NECESSITY: I certify, that based on my findings, NURSING services are a medically necessary home health service. HOME BOUND STATUS: I certify that my clinical findings support  that this patient is homebound (i.e., Due to illness or injury, pt requires aid of supportive devices such as crutches, cane, wheelchairs, walkers, the use of special transportation or the assistance of another person to leave their place of residence. There is a normal inability to leave the home and doing so requires considerable and taxing effort. Other absences are for medical reasons / religious services and are infrequent or of short duration when for other reasons). If current dressing causes regression in wound condition, may D/C ordered dressing product/s and apply Normal Saline Moist Dressing daily until next [place] / Other MD appointment. [place] of regression in wound condition at [phone]. Please direct any NON-WOUND related issues/requests for orders to patient's Primary Care Physician Negative Pressure Wound Therapy: NATANIA, FINIGAN. ([id number]) Wound #2 Right Gluteus: Wound VAC settings at 125/130 mmHg continuous pressure. Use BLACK/GREEN foam to wound cavity. Use WHITE foam to fill any tunnel/s and/or undermining. Change VAC dressing 3 X WEEK. Change canister as indicated when full. Nurse may  titrate settings and frequency of dressing changes as clinically indicated. - black foam to wound cavity only; BRIDGE TO RIGHT HIP. the patient is continuing on her IV antibiotics and after review today I have recommended: 1. Silver collagen to be applied to the right ankle, change daily 2. Silver collagen under the black foam for the wound VAC, to the right gluteal region to be changed 3 times a week. 3. adequate protein, vitamin A, vitamin C and zinc 4. commended her on continuing to be off smoking Electronic Signature(s) Signed: 05/01/2017 2:15:15 PM By: Christin Fudge MD, FACS Entered By: Christin Fudge on 05/01/2017 14:15:15 [place], [name] ([id number]) -------------------------------------------------------------------------------- SuperBill Details Patient Name: [name]. Date of Service: 05/01/2017 Medical Record Number: [id number] Patient Account Number: [id number] Date of Birth/Sex: 1965/12/13 (51 y.o. 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, right ankle and foot Facility Procedures CPT4 Code: 03491791 Description: 99214 - WOUND CARE VISIT-LEV 4 EST PT Modifier: Quantity: 1 Physician Procedures CPT4 Code Description: [id number] 99213 - WC PHYS LEVEL 3 - EST PT ICD-10 Description Diagnosis E11.621 Type 2 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

## 2017-05-05 ENCOUNTER — Encounter: Payer: Self-pay | Admitting: Cardiology

## 2017-05-05 ENCOUNTER — Ambulatory Visit: Payer: Medicare Other | Admitting: Infectious Diseases

## 2017-05-05 ENCOUNTER — Other Ambulatory Visit: Payer: Self-pay | Admitting: Pharmacist

## 2017-05-05 ENCOUNTER — Telehealth: Payer: Self-pay

## 2017-05-05 ENCOUNTER — Ambulatory Visit: Payer: Medicare Other | Admitting: Infectious Disease

## 2017-05-05 ENCOUNTER — Ambulatory Visit (INDEPENDENT_AMBULATORY_CARE_PROVIDER_SITE_OTHER): Payer: Medicare Other | Admitting: Pharmacist

## 2017-05-05 DIAGNOSIS — R110 Nausea: Secondary | ICD-10-CM

## 2017-05-05 DIAGNOSIS — M868X6 Other osteomyelitis, lower leg: Secondary | ICD-10-CM

## 2017-05-05 DIAGNOSIS — M869 Osteomyelitis, unspecified: Secondary | ICD-10-CM

## 2017-05-05 MED ORDER — SULFAMETHOXAZOLE-TRIMETHOPRIM 800-160 MG PO TABS: 2.0000 | ORAL_TABLET | Freq: Two times a day (BID) | ORAL | 0 refills | Status: DC

## 2017-05-05 MED ORDER — ONDANSETRON 4 MG PO TBDP: 4.0000 mg | ORAL_TABLET | Freq: Three times a day (TID) | ORAL | 0 refills | Status: DC | PRN

## 2017-05-05 NOTE — Telephone Encounter (Signed)
Patient walked into clinic today asking to have her PICC removed.   Her appointment with Dr [name] was cancelled for this week and she has not idea what the next step should be.   She completed her antibiotics on 05-01-2017..  I will speak with the pharmacist who has been reviewing her labs while on the IV antibiotics. Patient will wait to speak with Cassie.   According to Cassie it is ok to remove PICC and patient will start on oral antibiotics.    [facility].    Laverle Patter, RN

## 2017-05-05 NOTE — Progress Notes (Unsigned)
HPI: [name] is a 51 y.o. female who presents to the [place] clinic today as a walk in.   Allergies: Allergies  Allergen Reactions  . Penicillins Other (See Comments)    Severe Headache (not so sure about this now) Has patient had a PCN reaction causing immediate rash, facial/tongue/throat swelling, SOB or lightheadedness with hypotension: No Has patient had a PCN reaction causing severe rash involving mucus membranes or skin necrosis: No Has patient had a PCN reaction that required hospitalization: No Has patient had a PCN reaction occurring within the last 10 years: No If all of the above answers are "NO", then may proceed with Cephalosporin use.     Past Medical History: Past Medical History:  Diagnosis Date  . Arthritis   . Asthma   . Chronic back pain   . Depression   . Diabetes mellitus   . Diabetic neuropathy ([place])   . Family history of adverse reaction to anesthesia    mother had difficlty waking   . GERD (gastroesophageal reflux disease)   . Hyperlipidemia   . Joint pain   . Leg pain    With Walking  . Osteomyelitis of right fibula ([place]) 03/05/2017  . PAD (peripheral artery disease) ([place])   . Reflux   . Ulcer    Foot    Social History: Social History   Social History  . Marital status: Married    Spouse name: N/A  . Number of children: N/A  . Years of education: N/A   Occupational History  . disabled    Social History Main Topics  . Smoking status: Former Smoker    Years: 30.00    Types: Cigarettes    Quit date: 02/07/2017  . Smokeless tobacco: Never Used  . Alcohol use No  . Drug use: No  . Sexual activity: Yes    Birth control/ protection: Post-menopausal   Other Topics Concern  . Not on file   Social History Narrative  . No narrative on file    Labs: Hepatitis B Surface Ag (no units)  Date Value  12/31/2016 Negative    CrCl: Estimated Creatinine Clearance: 58.6 mL/min (A) (by C-G formula based on SCr of 1.08 mg/dL  (H)).  Lipids:    Component Value Date/Time   CHOL 213 (H) 01/16/2017 1538   TRIG 310 (H) 01/16/2017 1538   HDL 32 (L) 01/16/2017 1538   CHOLHDL 6.7 (H) 01/16/2017 1538   LDLCALC 119 (H) 01/16/2017 1538    Assessment: [name] came into clinic today as a walk in.  She has been on IV vanc + ceftriaxone for 6 weeks for osteomyelitis of her fibula.  She stopped IV antibiotics last Thursday and is requesting her PICC be removed and asking if she needs oral antibiotics.  Spoke with Dr. [name] and we will have [place] pull her PICC today.  I will send in Bactrim DS 2 tabs PO BID x 1 month.  She sees Dr. [name] next Monday 6/25 at 10:30am. She states she gets nauseous with antibiotics, so will send in some Zofran PRN for he r  Plans: - Pull PICC today - Bactrim DS 2 tabs PO BID x 1 month - F/u with Dr. [name] 6/25 at 10:30am  Cassie L. Kuppelweiser, PharmD, [facility] 05/05/2017, 11:17 AM

## 2017-05-05 NOTE — Telephone Encounter (Signed)
Sent letter

## 2017-05-05 NOTE — Progress Notes (Signed)
HPI: GYNETH HUBKA is a 51 y.o. female who presents to the [facility] clinic today as a walk in.   Allergies: Allergies  Allergen Reactions  . Penicillins Other (See Comments)    Severe Headache (not so sure about this now) Has patient had a PCN reaction causing immediate rash, facial/tongue/throat swelling, SOB or lightheadedness with hypotension: No Has patient had a PCN reaction causing severe rash involving mucus membranes or skin necrosis: No Has patient had a PCN reaction that required hospitalization: No Has patient had a PCN reaction occurring within the last 10 years: No If all of the above answers are "NO", then may proceed with Cephalosporin use.     Past Medical History: Past Medical History:  Diagnosis Date  . Arthritis   . Asthma   . Chronic back pain   . Depression   . Diabetes mellitus   . Diabetic neuropathy ([place])   . Family history of adverse reaction to anesthesia    mother had difficlty waking   . GERD (gastroesophageal reflux disease)   . Hyperlipidemia   . Joint pain   . Leg pain    With Walking  . Osteomyelitis of right fibula ([place]) 03/05/2017  . PAD (peripheral artery disease) ([place])   . Reflux   . Ulcer    Foot    Social History: Social History   Social History  . Marital status: Married    Spouse name: N/A  . Number of children: N/A  . Years of education: N/A   Occupational History  . disabled    Social History Main Topics  . Smoking status: Former Smoker    Years: 30.00    Types: Cigarettes    Quit date: 02/07/2017  . Smokeless tobacco: Never Used  . Alcohol use No  . Drug use: No  . Sexual activity: Yes    Birth control/ protection: Post-menopausal   Other Topics Concern  . Not on file   Social History Narrative  . No narrative on file    Labs: Hepatitis B Surface Ag (no units)  Date Value  12/31/2016 Negative    CrCl: CrCl cannot be calculated (Unknown ideal weight.).  Lipids:    Component Value Date/Time   CHOL 213  (H) 01/16/2017 1538   TRIG 310 (H) 01/16/2017 1538   HDL 32 (L) 01/16/2017 1538   CHOLHDL 6.7 (H) 01/16/2017 1538   LDLCALC 119 (H) 01/16/2017 1538    Assessment: Sydny Schnitzler is here today as a walk in.  She has been on IV vancomycin and ceftriaxone x 6 weeks for osteomyelitis of her fibula.  She completed IV antibiotics on 6/14 and comes in today asking if her PICC can be removed and asking if she needs to be put on oral antibiotics.  Spoke with Dr. [name] and will have [facility] remove patient's PICC today.  Will start her on Bactrim DS x 1 month.  She has a follow-up with Dr. [name] next Monday. She states she gets nauseous with medications, so I will send in some Zofran PRN for her.  Plans: - Pull PICC today - Bactrim DS 2 tablets PO BID - F/u with Dr. [name] 6/25 at 10:30am  Shavonna Corella L. Darrius Montano, PharmD, [facility] 05/05/2017, 2:44 PM

## 2017-05-08 ENCOUNTER — Encounter: Payer: Medicare Other | Admitting: Physician Assistant

## 2017-05-08 DIAGNOSIS — E11621 Type 2 diabetes mellitus with foot ulcer: Secondary | ICD-10-CM | POA: Diagnosis not present

## 2017-05-10 NOTE — Progress Notes (Signed)
[name] ([id number]) Visit Report for 05/08/2017 Arrival Information Details Patient Name: [name], [name]. Date of Service: 05/08/2017 9:15 AM Medical Record Number: [id number] Patient Account Number: [id number] Date of Birth/Sex: 1966-03-06 (51 y.o. Female) Treating RN: Montey Hora Primary Care Crystalann Korf: Delight Stare Other Clinician: Referring Sylvie Mifsud: Delight Stare Treating Azalia Neuberger/Extender: Melburn Hake, HOYT Weeks in Treatment: 15 Visit Information History Since Last Visit Added or deleted any medications: No Patient Arrived: Walker Any new allergies or adverse reactions: No Arrival Time: 09:20 Had a fall or experienced change in No Accompanied By: self activities of daily living that may affect Transfer Assistance: None risk of falls: Patient Identification Verified: Yes Signs or symptoms of abuse/neglect since last No Secondary Verification Process Completed: Yes visito Patient Requires Transmission-Based No Hospitalized since last visit: No Precautions: Has Dressing in Place as Prescribed: Yes Patient Has Alerts: No Pain Present Now: Yes Electronic Signature(s) Signed: 05/08/2017 5:06:57 PM By: Montey Hora Entered By: Montey Hora on 05/08/2017 09:21:11 Lillo, Megen Chauncey Cruel ([id number]) -------------------------------------------------------------------------------- Clinic Level of Care Assessment Details Patient Name: [name]. Date of Service: 05/08/2017 9:15 AM Medical Record Number: [id number] Patient Account Number: [id number] Date of Birth/Sex: Apr 15, 1966 (51 y.o. Female) Treating RN: Montey Hora Primary Care Shanicqua Coldren: Delight Stare Other Clinician: Referring Marlo Goodrich: Delight Stare Treating Terrianna Holsclaw/Extender: Melburn Hake, HOYT Weeks in Treatment: 15 Clinic Level of Care Assessment Items TOOL 4 Quantity Score []  - Use when only an EandM is performed on FOLLOW-UP visit 0 ASSESSMENTS - Nursing Assessment / Reassessment X - Reassessment of  Co-morbidities (includes updates in patient status) 1 10 X - Reassessment of Adherence to Treatment Plan 1 5 ASSESSMENTS - Wound and Skin Assessment / Reassessment []  - Simple Wound Assessment / Reassessment - one wound 0 X - Complex Wound Assessment / Reassessment - multiple wounds 2 5 []  - Dermatologic / Skin Assessment (not related to wound area) 0 ASSESSMENTS - Focused Assessment []  - Circumferential Edema Measurements - multi extremities 0 []  - Nutritional Assessment / Counseling / Intervention 0 []  - Lower Extremity Assessment (monofilament, tuning fork, pulses) 0 []  - Peripheral Arterial Disease Assessment (using hand held doppler) 0 ASSESSMENTS - Ostomy and/or Continence Assessment and Care []  - Incontinence Assessment and Management 0 []  - Ostomy Care Assessment and Management (repouching, etc.) 0 PROCESS - Coordination of Care X - Simple Patient / Family Education for ongoing care 1 15 []  - Complex (extensive) Patient / Family Education for ongoing care 0 []  - Staff obtains Programmer, systems, Records, Test Results / Process Orders 0 []  - Staff telephones HHA, Nursing Homes / Clarify orders / etc 0 []  - Routine Transfer to another Facility (non-emergent condition) 0 Boesel, Giovanni S. ([id number]) []  - Routine Hospital Admission (non-emergent condition) 0 []  - New Admissions / Biomedical engineer / Ordering NPWT, Apligraf, etc. 0 []  - Emergency Hospital Admission (emergent condition) 0 X - Simple Discharge Coordination 1 10 []  - Complex (extensive) Discharge Coordination 0 PROCESS - Special Needs []  - Pediatric / Minor Patient Management 0 []  - Isolation Patient Management 0 []  - Hearing / Language / Visual special needs 0 []  - Assessment of Community assistance (transportation, D/C planning, etc.) 0 []  - Additional assistance / Altered mentation 0 []  - Support Surface(s) Assessment (bed, cushion, seat, etc.) 0 INTERVENTIONS - Wound Cleansing / Measurement []  - Simple Wound  Cleansing - one wound 0 X - Complex Wound Cleansing - multiple wounds 2 5 X - Wound Imaging (photographs - any number of wounds) 1 5 []  -  Wound Tracing (instead of photographs) 0 []  - Simple Wound Measurement - one wound 0 X - Complex Wound Measurement - multiple wounds 2 5 INTERVENTIONS - Wound Dressings X - Small Wound Dressing one or multiple wounds 2 10 []  - Medium Wound Dressing one or multiple wounds 0 []  - Large Wound Dressing one or multiple wounds 0 X - Application of Medications - topical 1 5 []  - Application of Medications - injection 0 INTERVENTIONS - Miscellaneous []  - External ear exam 0 Yackley, Yomayra S. ([id number]) []  - Specimen Collection (cultures, biopsies, blood, body fluids, etc.) 0 []  - Specimen(s) / Culture(s) sent or taken to Lab for analysis 0 []  - Patient Transfer (multiple staff / Harrel Lemon Lift / Similar devices) 0 []  - Simple Staple / Suture removal (25 or less) 0 []  - Complex Staple / Suture removal (26 or more) 0 []  - Hypo / Hyperglycemic Management (close monitor of Blood Glucose) 0 []  - Ankle / Brachial Index (ABI) - do not check if billed separately 0 X - Vital Signs 1 5 Has the patient been seen at the hospital within the last three years: Yes Total Score: 105 Level Of Care: New/Established - Level 3 Electronic Signature(s) Signed: 05/08/2017 5:06:57 PM By: Montey Hora Entered By: Montey Hora on 05/08/2017 11:27:48 [name] ([id number]) -------------------------------------------------------------------------------- Encounter Discharge Information Details Patient Name: [name]. Date of Service: 05/08/2017 9:15 AM Medical Record Number: [id number] Patient Account Number: [id number] Date of Birth/Sex: 1966/02/01 (51 y.o. Female) Treating RN: Montey Hora Primary Care Terrance Usery: Delight Stare Other Clinician: Referring Aeson Sawyers: Delight Stare Treating Avish Torry/Extender: Melburn Hake, HOYT Weeks in Treatment: 15 Encounter Discharge  Information Items Discharge Pain Level: 0 Discharge Condition: Stable Ambulatory Status: Walker Discharge Destination: Home Transportation: Private Auto Accompanied By: self Schedule Follow-up Appointment: Yes Medication Reconciliation completed No and provided to Patient/Care Betsaida [place]: Provided on Clinical Summary of Care: 05/08/2017 Form Type Recipient Paper Patient RM Electronic Signature(s) Signed: 05/08/2017 10:30:18 AM By: Ruthine Dose Entered By: Ruthine Dose on 05/08/2017 10:30:18 Brightbill, Avah Chauncey Cruel ([id number]) -------------------------------------------------------------------------------- Multi Wound Chart Details Patient Name: [name]. Date of Service: 05/08/2017 9:15 AM Medical Record Number: [id number] Patient Account Number: [id number] Date of Birth/Sex: [birth year] (51 y.o. Female) Treating RN: Montey Hora Primary Care Armour Villanueva: Delight Stare Other Clinician: Referring Libbey Duce: Delight Stare Treating Issaih Kaus/Extender: STONE III, HOYT Weeks in Treatment: 15 Vital Signs Height(in): 67 Pulse(bpm): 86 Weight(lbs): 137 Blood Pressure 85/50 (mmHg): Body Mass Index(BMI): 21 Temperature(F): 97.9 Respiratory Rate 16 (breaths/min): Photos: [1:No Photos] [2:No Photos] [N/A:N/A] Wound Location: [1:Right Malleolus - Lateral] [2:Right Gluteus] [N/A:N/A] Wounding Event: [1:Gradually Appeared] [2:Gradually Appeared] [N/A:N/A] Primary Etiology: [1:Diabetic Wound/Ulcer of the Lower Extremity] [2:Open Surgical Wound] [N/A:N/A] Comorbid History: [1:Anemia, Arrhythmia, Coronary Artery Disease, Hypotension, Peripheral Arterial Disease, Type II Diabetes, Osteoarthritis] [2:Anemia, Arrhythmia, Coronary Artery Disease, Hypotension, Peripheral Arterial Disease, Type II Diabetes,  Osteoarthritis] [N/A:N/A] Date Acquired: [1:09/23/2016] [2:09/18/2016] [N/A:N/A] Weeks of Treatment: [1:15] [2:15] [N/A:N/A] Wound Status: [1:Open] [2:Open] [N/A:N/A] Measurements L x W x D  1.2x1.5x0.2 [2:2.3x1.8x2] [N/A:N/A] (cm) Area (cm) : [1:1.414] [2:3.252] [N/A:N/A] Volume (cm) : [1:0.283] [2:0.254] [N/A:N/A] % Reduction in Area: [1:64.00%] [2:83.10%] [N/A:N/A] % Reduction in Volume: 85.60% [2:93.20%] [N/A:N/A] Classification: [1:Grade 2] [2:Full Thickness Without Exposed Support Structures] [N/A:N/A] Exudate Amount: [1:Large] [2:Large] [N/A:N/A] Exudate Type: [1:Purulent] [2:Serous] [N/A:N/A] Exudate Color: [1:yellow, brown, green] [2:amber] [N/A:N/A] Wound Margin: [1:Distinct, outline attached Distinct, outline attached] [N/A:N/A] Granulation Amount: [1:Large (67-100%)] [2:Medium (34-66%)] [N/A:N/A] Granulation Quality: [1:Pink] [2:Red, Pink] [N/A:N/A] Necrotic Amount: [1:Small (1-33%)] [2:Medium (34-66%)] [  N/A:N/A] Exposed Structures: [2:N/A] [N/A:N/A] Fat Layer (Subcutaneous Tissue) Exposed: Yes Fascia: No Tendon: No Muscle: No Joint: No Bone: No Epithelialization: None None N/A Periwound Skin Texture: Induration: Yes No Abnormalities Noted N/A Excoriation: No Callus: No Crepitus: No Rash: No Scarring: No Periwound Skin Maceration: Yes Maceration: Yes N/A Moisture: Dry/Scaly: No Periwound Skin Color: Atrophie Blanche: No No Abnormalities Noted N/A Cyanosis: No Ecchymosis: No Erythema: No Hemosiderin Staining: No Mottled: No Pallor: No Rubor: No Temperature: No Abnormality No Abnormality N/A Tenderness on Yes Yes N/A Palpation: Wound Preparation: Ulcer Cleansing: Ulcer Cleansing: N/A Rinsed/Irrigated with Rinsed/Irrigated with Saline Saline Topical Anesthetic Topical Anesthetic Applied: Other: lidocaine Applied: Other: lidocaine 4% 4% Treatment Notes Electronic Signature(s) Signed: 05/08/2017 5:06:57 PM By: Montey Hora Entered By: Montey Hora on 05/08/2017 10:02:54 Piltz, Herbie [name] ([id number]) -------------------------------------------------------------------------------- [facility] Details Patient Name:  REINE, BRISTOW. Date of Service: 05/08/2017 9:15 AM Medical Record Number: [id number] Patient Account Number: [id number] Date of Birth/Sex: 11-24-1965 (51 y.o. Female) Treating RN: Montey Hora Primary Care Kimiah Hibner: Delight Stare Other Clinician: Referring Mackensi Mahadeo: Delight Stare Treating Trevontae Lindahl/Extender: Melburn Hake, HOYT Weeks in Treatment: 15 Active Inactive ` Orientation to the Wound Care Program Nursing Diagnoses: Knowledge deficit related to the wound healing center program Goals: Patient/caregiver will verbalize understanding of the [facility] Program Date Initiated: 01/23/2017 Target Resolution Date: 05/25/2017 Goal Status: Active Interventions: Provide education on orientation to the wound center Notes: ` Peripheral Neuropathy Nursing Diagnoses: Knowledge deficit related to disease process and management of peripheral neurovascular dysfunction Potential alteration in peripheral tissue perfusion (select prior to confirmation of diagnosis) Goals: Patient/caregiver will verbalize understanding of disease process and disease management Date Initiated: 01/23/2017 Target Resolution Date: 05/25/2017 Goal Status: Active Interventions: Assess signs and symptoms of neuropathy upon admission and as needed Provide education on Management of Neuropathy and Related Ulcers Provide education on Management of Neuropathy upon discharge from the [facility] Activities: Test ordered outside of clinic : 01/23/2017 Notes: MARTHENA, WHITMYER ([id number]) ` Pressure Nursing Diagnoses: Knowledge deficit related to causes and risk factors for pressure ulcer development Knowledge deficit related to management of pressures ulcers Potential for impaired tissue integrity related to pressure, friction, moisture, and shear Goals: Patient will remain free from development of additional pressure ulcers Date Initiated: 01/23/2017 Target Resolution Date: 05/25/2017 Goal Status:  Active Patient will remain free of pressure ulcers Date Initiated: 01/23/2017 Target Resolution Date: 05/25/2017 Goal Status: Active Patient/caregiver will verbalize risk factors for pressure ulcer development Date Initiated: 01/23/2017 Target Resolution Date: 05/25/2017 Goal Status: Active Patient/caregiver will verbalize understanding of pressure ulcer management Date Initiated: 01/23/2017 Target Resolution Date: 05/25/2017 Goal Status: Active Interventions: Assess: immobility, friction, shearing, incontinence upon admission and as needed Assess offloading mechanisms upon admission and as needed Assess potential for pressure ulcer upon admission and as needed Provide education on pressure ulcers Treatment Activities: Patient referred for pressure reduction/relief devices : 01/23/2017 Patient referred for seating evaluation to ensure proper offloading : 01/23/2017 Pressure reduction/relief device ordered : 01/23/2017 Notes: ` Wound/Skin Impairment Nursing Diagnoses: Impaired tissue integrity Knowledge deficit related to smoking impact on wound healing Knowledge deficit related to ulceration/compromised skin integrity Goals: Patient/caregiver will verbalize understanding of skin care regimen MAKYA, PHILLIS ([id number]) Date Initiated: 01/23/2017 Target Resolution Date: 05/25/2017 Goal Status: Active Ulcer/skin breakdown will have a volume reduction of 30% by week 4 Date Initiated: 01/23/2017 Target Resolution Date: 05/25/2017 Goal Status: Active Ulcer/skin breakdown will have a volume reduction of 50% by week 8 Date Initiated: 01/23/2017  Target Resolution Date: 05/25/2017 Goal Status: Active Ulcer/skin breakdown will have a volume reduction of 80% by week 12 Date Initiated: 01/23/2017 Target Resolution Date: 05/25/2017 Goal Status: Active Ulcer/skin breakdown will heal within 14 weeks Date Initiated: 01/23/2017 Target Resolution Date: 05/25/2017 Goal Status: Active Interventions: Assess  patient/caregiver ability to obtain necessary supplies Assess patient/caregiver ability to perform ulcer/skin care regimen upon admission and as needed Assess ulceration(s) every visit Provide education on smoking Provide education on ulcer and skin care Treatment Activities: Patient referred to home care : 01/23/2017 Referred to DME Manette Doto for dressing supplies : 01/23/2017 Skin care regimen initiated : 01/23/2017 Topical wound management initiated : 01/23/2017 Notes: Electronic Signature(s) Signed: 05/08/2017 5:06:57 PM By: Montey Hora Entered By: Montey Hora on 05/08/2017 10:02:43 Conradt, Makyia Chauncey Cruel ([id number]) -------------------------------------------------------------------------------- Pain Assessment Details Patient Name: [name]. Date of Service: 05/08/2017 9:15 AM Medical Record Number: [id number] Patient Account Number: [id number] Date of Birth/Sex: 07-11-1966 (51 y.o. Female) Treating RN: Montey Hora Primary Care Royston Bekele: Delight Stare Other Clinician: Referring Desarea Ohagan: Delight Stare Treating Annalysse Shoemaker/Extender: STONE III, HOYT Weeks in Treatment: 15 Active Problems Location of Pain Severity and Description of Pain Patient Has Paino Yes Site Locations Pain Location: Pain in Ulcers With Dressing Change: Yes Duration of the Pain. Constant / Intermittento Intermittent Pain Management and Medication Current Pain Management: Electronic Signature(s) Signed: 05/08/2017 5:06:57 PM By: Montey Hora Entered By: Montey Hora on 05/08/2017 09:21:55 Albin, Priscila Chauncey Cruel ([id number]) -------------------------------------------------------------------------------- Patient/Caregiver Education Details Patient Name: [name]. Date of Service: 05/08/2017 9:15 AM Medical Record Number: [id number] Patient Account Number: [id number] Date of Birth/Gender: 10/06/1966 (51 y.o. Female) Treating RN: Montey Hora Primary Care Physician: Delight Stare Other  Clinician: Referring Physician: Delight Stare Treating Physician/Extender: Sharalyn Ink in Treatment: 15 Education Assessment Education Provided To: Patient Education Topics Provided Nutrition: Handouts: Other: increased protein for wound healing Methods: Explain/Verbal Responses: State content correctly Electronic Signature(s) Signed: 05/08/2017 5:06:57 PM By: Montey Hora Entered By: Montey Hora on 05/08/2017 10:04:55 Coates, Epsie S. ([id number]) -------------------------------------------------------------------------------- Wound Assessment Details Patient Name: [name]. Date of Service: 05/08/2017 9:15 AM Medical Record Number: [id number] Patient Account Number: [id number] Date of Birth/Sex: 07-13-1966 (51 y.o. Female) Treating RN: Montey Hora Primary Care Sissi Padia: Delight Stare Other Clinician: Referring Cadyn Rodger: Delight Stare Treating Taige Housman/Extender: STONE III, HOYT Weeks in Treatment: 15 Wound Status Wound Number: 1 Primary Diabetic Wound/Ulcer of the Lower Etiology: Extremity Wound Location: Right Malleolus - Lateral Wound Open Wounding Event: Gradually Appeared Status: Date Acquired: 09/23/2016 Comorbid Anemia, Arrhythmia, Coronary Artery Weeks Of Treatment: 15 History: Disease, Hypotension, Peripheral Clustered Wound: No Arterial Disease, Type II Diabetes, Osteoarthritis Photos Photo Uploaded By: Montey Hora on 05/08/2017 11:34:08 Wound Measurements Length: (cm) 1.2 Width: (cm) 1.5 Depth: (cm) 0.2 Area: (cm) 1.414 Volume: (cm) 0.283 % Reduction in Area: 64% % Reduction in Volume: 85.6% Epithelialization: None Tunneling: No Undermining: No Wound Description Classification: Grade 2 Foul Odor Aft Wound Margin: Distinct, outline attached Slough/Fibrin Exudate Amount: Large Exudate Type: Purulent Exudate Color: yellow, brown, green er Cleansing: No o Yes Wound Bed Granulation Amount: Large (67-100%) Exposed  Structure Granulation Quality: Pink Fascia Exposed: No Necrotic Amount: Small (1-33%) Fat Layer (Subcutaneous Tissue) Exposed: Yes Parker, Elenna S. ([id number]) Necrotic Quality: Adherent Slough Tendon Exposed: No Muscle Exposed: No Joint Exposed: No Bone Exposed: No Periwound Skin Texture Texture Color No Abnormalities Noted: No No Abnormalities Noted: No Callus: No Atrophie Blanche: No Crepitus: No Cyanosis: No Excoriation: No Ecchymosis: No Induration: Yes Erythema: No Rash: No  Hemosiderin Staining: No Scarring: No Mottled: No Pallor: No Moisture Rubor: No No Abnormalities Noted: No Dry / Scaly: No Temperature / Pain Maceration: Yes Temperature: No Abnormality Tenderness on Palpation: Yes Wound Preparation Ulcer Cleansing: Rinsed/Irrigated with Saline Topical Anesthetic Applied: Other: lidocaine 4%, Treatment Notes Wound #1 (Right, Lateral Malleolus) 1. Cleansed with: Clean wound with Normal Saline 2. Anesthetic Topical Lidocaine 4% cream to wound bed prior to debridement 3. Peri-wound Care: Skin Prep 4. Dressing Applied: Prisma Ag 5. Secondary Dressing Applied Bordered Foam Dressing Dry Gauze Electronic Signature(s) Signed: 05/08/2017 5:06:57 PM By: Montey Hora Entered By: Montey Hora on 05/08/2017 10:02:14 Goel, Troy Chauncey Cruel ([id number]) -------------------------------------------------------------------------------- Wound Assessment Details Patient Name: [name]. Date of Service: 05/08/2017 9:15 AM Medical Record Number: [id number] Patient Account Number: [id number] Date of Birth/Sex: 01-Dec-1965 (51 y.o. Female) Treating RN: Montey Hora Primary Care Kelley Polinsky: Delight Stare Other Clinician: Referring Tun[place] Landgrebe: Delight Stare Treating Khiyan Crace/Extender: STONE III, HOYT Weeks in Treatment: 15 Wound Status Wound Number: 2 Primary Open Surgical Wound Etiology: Wound Location: Right Gluteus Wound Open Wounding Event: Gradually  Appeared Status: Date Acquired: 09/18/2016 Comorbid Anemia, Arrhythmia, Coronary Artery Weeks Of Treatment: 15 History: Disease, Hypotension, Peripheral Clustered Wound: No Arterial Disease, Type II Diabetes, Osteoarthritis Photos Photo Uploaded By: Montey Hora on 05/08/2017 11:34:08 Wound Measurements Length: (cm) 2.3 Width: (cm) 1.8 Depth: (cm) 2 Area: (cm) 3.252 Volume: (cm) 6.503 % Reduction in Area: 83.1% % Reduction in Volume: 93.2% Epithelialization: None Tunneling: No Undermining: No Wound Description Full Thickness Without Exposed Classification: Support Structures Wound Margin: Distinct, outline attached Exudate Large Amount: Exudate Type: Serous Exudate Color: amber Foul Odor After Cleansing: No Slough/Fibrino No Wound Bed Granulation Amount: Medium (34-66%) Wamble, Teyana S. ([id number]) Granulation Quality: Red, Pink Necrotic Amount: Medium (34-66%) Necrotic Quality: Adherent Slough Periwound Skin Texture Texture Color No Abnormalities Noted: No No Abnormalities Noted: No Moisture Temperature / Pain No Abnormalities Noted: No Temperature: No Abnormality Maceration: Yes Tenderness on Palpation: Yes Wound Preparation Ulcer Cleansing: Rinsed/Irrigated with Saline Topical Anesthetic Applied: Other: lidocaine 4%, Treatment Notes Wound #2 (Right Gluteus) 1. Cleansed with: Clean wound with Normal Saline 2. Anesthetic Topical Lidocaine 4% cream to wound bed prior to debridement 3. Peri-wound Care: Antifungal cream 4. Dressing Applied: Prisma Ag 5. Secondary Dressing Applied ABD Pad Dry Gauze 7. Secured with Recruitment consultant) Signed: 05/08/2017 5:06:57 PM By: Montey Hora Entered By: Montey Hora on 05/08/2017 10:02:31 Muzyka, Herbie [name] ([id number]) -------------------------------------------------------------------------------- Vitals Details Patient Name: FALAN, HENSLER. Date of Service: 05/08/2017 9:15 AM Medical  Record Number: [id number] Patient Account Number: [id number] Date of Birth/Sex: June 11, 1966 (51 y.o. Female) Treating RN: Montey Hora Primary Care Evynn Boutelle: Delight Stare Other Clinician: Referring Levonne Carreras: Delight Stare Treating Clifford Coudriet/Extender: STONE III, HOYT Weeks in Treatment: 15 Vital Signs Time Taken: 09:22 Temperature (F): 97.9 Height (in): 67 Pulse (bpm): 86 Weight (lbs): 137 Respiratory Rate (breaths/min): 16 Body Mass Index (BMI): 21.5 Blood Pressure (mmHg): 85/50 Reference Range: 80 - 120 mg / dl Electronic Signature(s) Signed: 05/08/2017 5:06:57 PM By: Montey Hora Entered By: Montey Hora on 05/08/2017 82:70:78

## 2017-05-12 ENCOUNTER — Ambulatory Visit (INDEPENDENT_AMBULATORY_CARE_PROVIDER_SITE_OTHER): Payer: Medicare Other | Admitting: Infectious Disease

## 2017-05-12 ENCOUNTER — Encounter: Payer: Self-pay | Admitting: Infectious Disease

## 2017-05-12 ENCOUNTER — Ambulatory Visit (INDEPENDENT_AMBULATORY_CARE_PROVIDER_SITE_OTHER): Payer: Medicare Other | Admitting: Surgery

## 2017-05-12 ENCOUNTER — Encounter: Payer: Self-pay | Admitting: Surgery

## 2017-05-12 VITALS — Temp 97.1°F | Wt 146.0 lb

## 2017-05-12 VITALS — BP 70/40 | HR 89 | Temp 97.8°F | Resp 18 | Ht <= 58 in | Wt 149.0 lb

## 2017-05-12 DIAGNOSIS — I6523 Occlusion and stenosis of bilateral carotid arteries: Secondary | ICD-10-CM | POA: Diagnosis not present

## 2017-05-12 DIAGNOSIS — M86471 Chronic osteomyelitis with draining sinus, right ankle and foot: Secondary | ICD-10-CM | POA: Diagnosis not present

## 2017-05-12 DIAGNOSIS — M726 Necrotizing fasciitis: Secondary | ICD-10-CM

## 2017-05-12 DIAGNOSIS — I7025 Atherosclerosis of native arteries of other extremities with ulceration: Secondary | ICD-10-CM | POA: Diagnosis not present

## 2017-05-12 DIAGNOSIS — IMO0001 Reserved for inherently not codable concepts without codable children: Secondary | ICD-10-CM

## 2017-05-12 DIAGNOSIS — M86661 Other chronic osteomyelitis, right tibia and fibula: Secondary | ICD-10-CM

## 2017-05-12 DIAGNOSIS — Z794 Long term (current) use of insulin: Secondary | ICD-10-CM

## 2017-05-12 DIAGNOSIS — E119 Type 2 diabetes mellitus without complications: Secondary | ICD-10-CM | POA: Diagnosis not present

## 2017-05-12 DIAGNOSIS — I739 Peripheral vascular disease, unspecified: Secondary | ICD-10-CM

## 2017-05-12 DIAGNOSIS — L8944 Pressure ulcer of contiguous site of back, buttock and hip, stage 4: Secondary | ICD-10-CM

## 2017-05-12 LAB — CBC WITH DIFFERENTIAL/PLATELET
BASOS PCT: 1 %
Basophils Absolute: 93 cells/uL (ref 0–200)
EOS ABS: 372 {cells}/uL (ref 15–500)
Eosinophils Relative: 4 %
HEMATOCRIT: 38.7 % (ref 35.0–45.0)
HEMOGLOBIN: 12.5 g/dL (ref 11.7–15.5)
LYMPHS ABS: 2697 {cells}/uL (ref 850–3900)
Lymphocytes Relative: 29 %
MCH: 27.1 pg (ref 27.0–33.0)
MCHC: 32.3 g/dL (ref 32.0–36.0)
MCV: 83.8 fL (ref 80.0–100.0)
MONO ABS: 558 {cells}/uL (ref 200–950)
MPV: 9.7 fL (ref 7.5–12.5)
Monocytes Relative: 6 %
NEUTROS ABS: 5580 {cells}/uL (ref 1500–7800)
Neutrophils Relative %: 60 %
PLATELETS: 293 10*3/uL (ref 140–400)
RBC: 4.62 MIL/uL (ref 3.80–5.10)
RDW: 15.8 % — ABNORMAL HIGH (ref 11.0–15.0)
WBC: 9.3 10*3/uL (ref 3.8–10.8)

## 2017-05-12 LAB — BASIC METABOLIC PANEL WITH GFR
BUN: 35 mg/dL — ABNORMAL HIGH (ref 7–25)
CHLORIDE: 100 mmol/L (ref 98–110)
CO2: 29 mmol/L (ref 20–31)
Calcium: 10.1 mg/dL (ref 8.6–10.4)
Creat: 1.46 mg/dL — ABNORMAL HIGH (ref 0.50–1.05)
GFR, EST NON AFRICAN AMERICAN: 41 mL/min — AB (ref >=60)
GFR, Est African American: 47 mL/min — ABNORMAL LOW (ref >=60)
Glucose, Bld: 198 mg/dL — ABNORMAL HIGH (ref 65–99)
Potassium: 5.1 mmol/L (ref 3.5–5.3)
SODIUM: 137 mmol/L (ref 135–146)

## 2017-05-12 NOTE — Progress Notes (Signed)
[facility]  Patient name: [name] MRN: [id number] DOB: 1966-05-11 Sex: female   REASON FOR VISIT:    Right leg wound  HISOTRY OF PRESENT ILLNESS:    [name] a 51 y.o.femalewho presents for follow-up.  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  She has had multiple interventions for recurrent stenosis.  She has also undergone balloon angioplasty of her right subclavian artery for upper extremity numbness.  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  She has known osteomyelitis.  She is on prolonged antibiotics and has a wound VAC in place.  While in the hospital in February 2018 she had a wound on her right foot.  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  She is not a candidate for percutaneous intervention.  She needs surgical revascularization but because of her infectious issues this has been delayed.  She feels like the wound on her right leg is getting somewhat smaller.  She has been told her sacral decubitus ulcer is also getting smaller.  Her PICC line has been removed.  She is on oral antibiotics. She states that her vision is getting wose I her ee.  She denies any fevers or chills.   PAST MEDICAL HISTORY:   Past Medical History:  Diagnosis Date  . Arthritis   . Asthma   . Chronic back pain   . Depression   . Diabetes mellitus   . Diabetic neuropathy ([place])   . Family history of adverse reaction to anesthesia    mother had difficlty waking   . GERD (gastroesophageal reflux disease)   . Hyperlipidemia   . Joint pain   . Leg pain    With Walking  . Osteomyelitis of right fibula ([place]) 03/05/2017  . PAD (peripheral artery disease) ([place])   . Reflux   . Ulcer    Foot     FAMILY HISTORY:   Family History  Problem Relation Age of Onset  . Coronary artery disease Mother   .  Peripheral vascular disease Mother   . Heart disease Mother        Before age 66  . Other Mother        Venous insuffiency  . Diabetes Mother   . Hyperlipidemia Mother   . Hypertension Mother   . Varicose Veins Mother   . Heart attack Mother        before age 71  . Heart disease Father   . Diabetes Father   . Diabetes Maternal Grandmother   . Diabetes Paternal Grandmother   . Diabetes Paternal Grandfather   . Diabetes Sister   . Hypertension Sister   . Diabetes Brother   . Hypertension Brother     SOCIAL HISTORY:   Social History  Substance Use Topics  . Smoking status: Former Smoker    Years: 30.00    Types: Cigarettes    Quit date: 02/07/2017  . Smokeless tobacco: Never Used  . Alcohol use No     ALLERGIES:   Allergies  Allergen Reactions  . Penicillins Other (See Comments)    Severe Headache (not so sure about this now) Has patient had a PCN reaction causing immediate rash, facial/tongue/throat swelling, SOB or lightheadedness with hypotension: No Has patient had a PCN reaction causing severe rash involving mucus membranes or skin necrosis: No Has patient had a PCN reaction that required hospitalization: No Has patient had  a PCN reaction occurring within the last 10 years: No If all of the above answers are "NO", then may proceed with Cephalosporin use.      CURRENT MEDICATIONS:   Current Outpatient Prescriptions  Medication Sig Dispense Refill  . albuterol (PROVENTIL) 2 MG tablet Take 2 mg by mouth 3 (three) times daily as needed for shortness of breath.     Marland Kitchen amoxicillin-clavulanate (AUGMENTIN) 875-125 MG tablet Take 1 tablet by mouth 2 (two) times daily. 60 tablet 5  . aspirin 81 MG tablet Take 81 mg by mouth daily.     Marland Kitchen atorvastatin (LIPITOR) 40 MG tablet Take 1 tablet (40 mg total) by mouth daily. 90 tablet 3  . cetirizine (ZYRTEC) 10 MG tablet Take 10 mg by mouth daily as needed for allergies. Reported on 03/27/2016    . cyclobenzaprine (FLEXERIL) 10  MG tablet Limit 1 tablet by mouth 1  to  3 times per day if tolerated (Patient taking differently: Take 10 mg by mouth 3 (three) times daily as needed for muscle spasms. ) 90 tablet 0  . dicyclomine (BENTYL) 10 MG capsule Take 10 mg by mouth 4 (four) times daily as needed for spasms.    Marland Kitchen doxycycline (VIBRA-TABS) 100 MG tablet Take 1 tablet (100 mg total) by mouth 2 (two) times daily. (Patient not taking: Reported on 05/12/2017) 60 tablet 5  . furosemide (LASIX) 40 MG tablet Take 40 mg by mouth 2 (two) times daily as needed for fluid.     Marland Kitchen HUMALOG KWIKPEN 100 UNIT/ML KiwkPen Inject 10 Units into the skin 3 (three) times daily. Per sliding scale    . HYDROcodone-acetaminophen (NORCO) 10-325 MG tablet Limit 1 tablet by mouth per day or twice per day if tolerated (Patient taking differently: Take 1 tablet by mouth See admin instructions. Every 8-12 hours) 60 tablet 0  . LANTUS SOLOSTAR 100 UNIT/ML Solostar Pen Inject 36 Units into the skin 2 (two) times daily.     Marland Kitchen LORazepam (ATIVAN) 0.5 MG tablet Take 1 tablet 1 hour prior to PICC insertion.  May take 2nd pill at hospital if needed. 2 tablet 0  . meclizine (ANTIVERT) 25 MG tablet Take 25 mg by mouth 3 (three) times daily as needed for dizziness.    . midodrine (PROAMATINE) 10 MG tablet Take 1 tablet (10 mg total) by mouth 3 (three) times daily with meals. 90 tablet 0  . omeprazole (PRILOSEC) 40 MG capsule Take 40 mg by mouth daily.    . ondansetron (ZOFRAN ODT) 4 MG disintegrating tablet Take 1 tablet (4 mg total) by mouth every 8 (eight) hours as needed for nausea or vomiting. 20 tablet 0  . pregabalin (LYRICA) 100 MG capsule Limit 1 tab by mouth twice a day to 3 times a day if tolerated (Patient taking differently: Take 100 mg by mouth 3 (three) times daily. ) 90 capsule 0  . pseudoephedrine-guaifenesin (MUCINEX D) 60-600 MG per tablet Take 1 tablet by mouth every 12 (twelve) hours as needed for congestion. Reported on 04/08/2016    .  sulfamethoxazole-trimethoprim (BACTRIM DS,SEPTRA DS) 800-160 MG tablet Take 2 tablets by mouth 2 (two) times daily. 120 tablet 0   No current facility-administered medications for this visit.    Facility-Administered Medications Ordered in Other Visits  Medication Dose Route Frequency Provider Last Rate Last Dose  . 0.9 %  sodium chloride infusion   Intravenous Continuous Serafina Mitchell, MD        REVIEW OF SYSTEMS:   [  X] denotes positive finding, [ ]  denotes negative finding Cardiac  Comments:  Chest pain or chest pressure:    Shortness of breath upon exertion:    Short of breath when lying flat:    Irregular heart rhythm:        Vascular    Pain in calf, thigh, or hip brought on by ambulation: x   Pain in feet at night that wakes you up from your sleep:  x   Blood clot in your veins:    Leg swelling:         Pulmonary    Oxygen at home:    Productive cough:     Wheezing:         Neurologic    Sudden weakness in arms or legs:     Sudden numbness in arms or legs:     Sudden onset of difficulty speaking or slurred speech:    Temporary loss of vision in one eye:     Problems with dizziness:         Gastrointestinal    Blood in stool:     Vomited blood:         Genitourinary    Burning when urinating:     Blood in urine:        Psychiatric    Major depression:         Hematologic    Bleeding problems:    Problems with blood clotting too easily:        Skin    Rashes or ulcers:        Constitutional    Fever or chills:      PHYSICAL EXAM:   There were no vitals filed for this visit.  GENERAL: The patient is a well-nourished female, in no acute distress. The vital signs are documented above. CARDIAC: There is a regular rate and rhythm.  VASCULAR: I cannot palpate pedal pulse on the right. PULMONARY: Non-labored respirations MUSCULOSKELETAL: There are no major deformities or cyanosis. NEUROLOGIC: No focal weakness or paresthesias are detected. SKIN:  Right lateral ankle ulcer PSYCHIATRIC: The patient has a normal affect.  STUDIES:   None  MEDICAL ISSUES:   The patient was recently switched to oral antibiotics.  She still has a wound on her right lateral ankle as well as her sacral decubitus.  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  The patient needs an operation to improve inflow to the right leg.  The options would be an aortobifemoral bypass graft, an axillary bifemoral bypass graft, or a left-to-right femoral-femoral bypass.  She has occluded left subclavian artery and has undergone balloon angioplasty of her right subclavian artery, therefore I do not think basing an axillary bifemoral bypass graft off the diseased artery is a good option.  At this point, I am not sure if she will tolerate an aortic based procedure.  I'm a little reluctant to proceed with a left-to-right femoral-femoral graft as her left iliac system is also diseased.  With no good options for revascularization at this time, we will continue to monitor the patient nonoperatively.  I would like for her sacral decubitus wound to close up for putting her at risk for bypass.  I'm getting get a CT scan in September when she returns to determine if her right femoral-popliteal bypass graft remains patent.  I will also follow up carotid Doppler studies given her visual disturbances.    [name], MD [facility] [phone] Pager [phone] 

## 2017-05-12 NOTE — Progress Notes (Signed)
PHILA, SHOAF ([id number]) Visit Report for 05/08/2017 Chief Complaint Document Details Patient Name: [name], [name]. Date of Service: 05/08/2017 9:15 AM Medical Record Number: [id number] Patient Account Number: [id number] Date of Birth/Sex: [birth year] (51 y.o. Female) Treating RN: Montey Hora Primary Care Provider: Delight Stare Other Clinician: Referring Provider: Delight Stare Treating Provider/Extender: Melburn Hake, HOYT Weeks in Treatment: 15 Information Obtained from: Patient Chief Complaint Patients presents for treatment of an open diabetic ulcer with an arterial etiology on her right lateral ankle and right gluteal area Electronic Signature(s) Signed: 05/08/2017 5:49:59 PM By: Worthy Keeler PA-C Entered By: Worthy Keeler on 05/08/2017 17:26:36 Howry, [name] ([id number]) -------------------------------------------------------------------------------- HPI Details Patient Name: [name]. Date of Service: 05/08/2017 9:15 AM Medical Record Number: [id number] Patient Account Number: [id number] Date of Birth/Sex: Nov 26, 1965 (51 y.o. Female) Treating RN: Montey Hora Primary Care Provider: Delight Stare Other Clinician: Referring Provider: Delight Stare Treating Provider/Extender: STONE III, HOYT Weeks in Treatment: 15 History of Present Illness Location: right lateral ankle and right gluteal area Quality: Patient reports experiencing a sharp pain to affected area(s). Severity: Patient states wound are getting worse. Duration: Patient has had the wound for > 4 months prior to seeking treatment at the wound center Timing: Pain in wound is constant (hurts all the time) Context: The wound occurred when the patient was in hospital with a necrotizing fasciitis of the right gluteal area and a ulceration on the right ankle Modifying Factors: Other treatment(s) tried include:admitted to the hospital for IV antibiotics and a full workup and has also had a recent  angiogram Associated Signs and Symptoms: Patient reports having increase discharge. HPI Description: 51 year old patient was sent to [place] from [facility] where she was seen by Dr. Sherril Cong for a left ankle ulceration and was recently hospitalized with hypotension and sepsis. She was treated with IV antibiotics and has been scheduled to see Dr. Sharol Given. She was seen by vascular surgery who recommended a femoral bypass but surgery has been delayed until her sacral wound from last year's necrotizing fasciitis has healed. Was seeing the wound care team at [facility] but wanted to change over. She is a smoker and smokes a pack of cigarettes a day The patient was recently admitted in [place] between February 2 and February 14. She had a follow- up to see vascular surgery, orthopedic surgery and infectious disease. 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 Review of the electronic medical records reveals that Dr. 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 An MRI showed edema in the bone which was consistent with osteomyelitis and the chronic ulcer and may need surgical intervention. She also had a sacral decubitus ulcer which was treated with the wound VAC. On 01/07/2017 she was taken up by the vascular surgeon Dr. Trula Slade for an abdominal aortogram and bilateral lower extremity runoff, for a history of having bilateral femoropopliteal bypass graft as well as external iliac stenting on the right and stenting of her bypass graft. She had developed a nonhealing  wound on her right ankle and there was a possibility of a femoral occlusion. the findings were noted  and the impression was a surgical revascularization with a aorto bifemoral bypass graft. Both the femoropopliteal bypass grafts were patent. 01/31/2017 -- x-ray of the right hip and pelvis -- IMPRESSION:No radiographic evidence of acute osteomyelitis. Normal-appearing right hip joint space for age. No acute bony abnormality of the hip. 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 Since seeing her last week she has not had an appointment with infectious disease or the orthopedic specialist yet. ROSSETTA, KAMA ([id number]) 02/06/2017 -- the patient missed a couple of appointments yesterday due to the weather but other than that has apparently given up smoking for the last 5 days. 02/13/2017 -- she has rescheduled her infectious disease appointment and also the appointment with the orthopedic surgeon Dr. Sharol Given 03/06/2017 -- she was seen by Dr. Lucianne Lei dam of infectious disease on 03/05/2017 and he recommendedIV antibiotics but the patient did not want to have that and he has given her Augmentin and doxycycline and will reevaluate her in 2 months time. 03/13/2017 -- he saw Dr. Trula Slade regarding her vascular issues and he would like to wait to the sacral wound is completely closed before considering aortobifemoral bypass graft. He will see her back in 2 months time. She was also seen by Dr. Sharol Given of orthopedics who recommended continue wound care dressings and follow in the office in about 2 months time. he also recommended 3 view radiographs of the right ankle at follow-up. 03/28/2017 -- she did have a PICC line placed and Dr. Lucianne Lei dam has begun IV vancomycin and ceftriaxone. she is going to continue this until she sees him in approximately a month's time 04/18/2017 -- we had applied for a skin substitute for the patient's care but her copayment  is going to be about $300 a piece and the patient will not be able to afford this. 05/08/17 on evaluation today patient appears to potentially have a fungal rash in the periwound region of the sacral area. This also seems to extend into the inguinal creases and factional region as well. She is tender to palpation with light rubbing over the region of the periwound and the skin in this region does have a beefy red appearance. Everything seems to point to a fungal infection at this time. She has been tolerating the dressing changes up to this point. There is no evidence of infection otherwise. Electronic Signature(s) Signed: 05/08/2017 5:49:59 PM By: Worthy Keeler PA-C Entered By: Worthy Keeler on 05/08/2017 17:27:48 Renaud, [name] ([id number]) -------------------------------------------------------------------------------- Physical Exam Details Patient Name: [name], [name] Date of Service: 05/08/2017 9:15 AM Medical Record Number: [id number] Patient Account Number: [id number] Date of Birth/Sex: [birth year] (51 y.o. Female) Treating RN: Montey Hora Primary Care Provider: Delight Stare Other Clinician: Referring Provider: Delight Stare Treating Provider/Extender: STONE III, HOYT Weeks in Treatment: 74 Constitutional Well-nourished and well-hydrated in no acute distress. Respiratory normal breathing without difficulty. clear to auscultation bilaterally. Psychiatric this patient is able to make decisions and demonstrates good insight into disease process. Alert and Oriented x 3. pleasant and cooperative. Notes Other than the fact that patient has what appears to be a fungal infection in the periwound area of her sacral wound I feel like she is doing fairly well today and no debridement was required. Electronic Signature(s) Signed: 05/08/2017 5:49:59 PM By: Worthy Keeler PA-C Entered By: Worthy Keeler on 05/08/2017 17:28:35 Pitner, [name]  ([id number]) -------------------------------------------------------------------------------- Physician Orders Details Patient Name: [name]  S. Date of Service: 05/08/2017 9:15 AM Medical Record Number: [id number] Patient Account Number: [id number] Date of Birth/Sex: 1966-02-15 (51 y.o. 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 Wound #2 Right Gluteus o Clean wound with Normal Saline. Anesthetic Wound #1 Right,Lateral Malleolus o Topical Lidocaine 4% cream applied to wound bed prior to debridement - in clinic Wound #2 Right Gluteus o Topical Lidocaine 4% cream applied to wound bed prior to debridement - in clinic Skin Barriers/Peri-Wound Care Wound #1 Right,Lateral Malleolus o Skin Prep Wound #2 Right Gluteus o Skin Prep o Antifungal cream - per prescription as ordered Primary Wound Dressing Wound #1 Right,Lateral Malleolus o Prisma Ag - please pack into the undermining's. [place], Breeanna S. ([id number]) Wound #2 Right Gluteus o Prisma Ag - please pack into the undermining's. Secondary Dressing Wound #1 Right,Lateral Malleolus o Dry Gauze o Boardered Foam Dressing Wound #2 Right Gluteus o ABD pad - secure with tape o Dry Gauze Dressing Change  Frequency Wound #1 Right,Lateral Malleolus o Change dressing every day. Wound #2 Right Gluteus o Change dressing every day. Follow-up Appointments Wound #1 Right,Lateral Malleolus o Return Appointment in 1 week. Wound #2 Right Gluteus o Return Appointment in 1 week. Additional Orders / Instructions Wound #1 Right,Lateral Malleolus o Stop Smoking o Increase protein intake. o Activity as tolerated o Other: - Please add vitamin A, vitamin C and zinc supplements to your diet Wound #2 Right Gluteus o Stop Smoking o Increase protein intake. o Activity as tolerated o Other: - Please add vitamin A, vitamin C and zinc supplements to your diet Home Health Wound #1 [place] Visits - [place] Nurse may visit PRN to address patientos wound care needs. o FACE TO FACE ENCOUNTER: MEDICARE and MEDICAID PATIENTS: I certify that this patient is under my care and that I had a face-to-face encounter that meets the physician face-to-face encounter requirements with this patient on this date. The encounter with the patient was in [place], [place]. ([id number]) whole or in part for the following MEDICAL CONDITION: (primary reason for [place]) MEDICAL NECESSITY: I certify, that based on my findings, NURSING services are a medically necessary home health service. HOME BOUND STATUS: I certify that my clinical findings support that this patient is homebound (i.e., Due to illness or injury, pt requires aid of supportive devices such as crutches, cane, wheelchairs, walkers, the use of special transportation or the assistance of another person to leave their place of residence. There is a normal inability to leave the home and doing so requires considerable and taxing effort. Other absences are for medical reasons / religious services and are infrequent or of short duration when for other reasons). o If  current dressing causes regression in wound condition, may D/C ordered dressing product/s and apply Normal Saline Moist Dressing daily until next [place] / Other MD appointment. [place] of regression in  wound condition at [phone]. o Please direct any NON-WOUND related issues/requests for orders to patient's Primary Care Physician Wound #2 Right [place] Visits - [place] Nurse may visit PRN to address patientos wound care needs. o FACE TO FACE ENCOUNTER: MEDICARE and MEDICAID PATIENTS: I certify that this patient is under my care and that I had a face-to-face encounter that meets the physician face-to-face encounter requirements with this patient on this date. The encounter with the patient was in whole or in part for the following MEDICAL CONDITION: (primary reason for [place]) MEDICAL NECESSITY: I certify, that based on my findings, NURSING services are a medically necessary home health service. HOME BOUND STATUS: I certify that my clinical findings support that this patient is homebound (i.e., Due to illness or injury, pt requires aid of supportive devices such as crutches, cane, wheelchairs, walkers, the use of special transportation or the assistance of another person to leave their place of residence. There is a normal inability to leave the home and doing so requires considerable and taxing effort. Other absences are for medical reasons / religious services and are infrequent or of short duration when for other reasons). o If current dressing causes regression in wound condition, may D/C ordered dressing product/s and apply Normal Saline Moist Dressing daily until next [place] / Other MD appointment. [place] of regression in wound condition at [phone]. o Please direct any NON-WOUND related issues/requests for orders to patient's Primary  Care Physician Negative Pressure Wound Therapy Wound #2 Right Gluteus o Place NPWT on HOLD. Notes I'm going to recommend that we hold the wound VAC for the next week. We will pack the wound with Prisma and then I am going to place her on a mixture of triamcinolone were .1%, zinc, nystatin 100,000 units mixed 1:1:1 and applied to the sacral periwound, inguinal increases, and gluteal region daily. Hopefully this will help treat this issue which has been somewhat tender for her up to this point. We will see her for reevaluation in one week. If anything worsens in the interim she will contact the office and let us know otherwise hopefully she will be improved in regard to the fungal rash next week. Electronic Signature(s) SHAMONA, WIRTZ ([id number]) Signed: 05/08/2017 5:49:59 PM By: Worthy Keeler PA-C Previous Signature: 05/08/2017 5:06:57 PM Version By: Montey Hora Entered By: Worthy Keeler on 05/08/2017 17:30:41 Fahr, [name] ([id number]) -------------------------------------------------------------------------------- Problem List Details Patient Name: [name], [name]. Date of Service: 05/08/2017 9:15 AM Medical Record Number: [id number] Patient Account Number: [id number] Date of Birth/Sex: [birth year] (51 y.o. Female) Treating RN: Montey Hora Primary Care Provider: Delight Stare Other Clinician: Referring Provider: Delight Stare Treating Provider/Extender: Melburn Hake, HOYT Weeks in Treatment: 15 Active Problems ICD-10 Encounter Code Description Active Date Diagnosis E11.621 Type 2 diabetes mellitus with foot ulcer 01/23/2017 Yes L89.314 Pressure ulcer of right buttock, stage 4 01/23/2017 Yes L97.312 Non-pressure chronic ulcer of right ankle with fat layer 01/23/2017 Yes exposed F17.218 Nicotine dependence, cigarettes, with other nicotine- 01/23/2017 Yes induced disorders I70.233 Atherosclerosis of native arteries of right leg with 01/23/2017 Yes ulceration of ankle M86.371  Chronic multifocal osteomyelitis, right ankle and foot 01/23/2017 Yes Inactive Problems Resolved Problems Electronic Signature(s) Signed: 05/08/2017 5:49:59 PM By: Worthy Keeler PA-C Entered By: Worthy Keeler on 05/08/2017 17:26:11 Zertuche, [name] ([id number]) -------------------------------------------------------------------------------- Progress Note Details Patient Name: [name]. Date of Service: 05/08/2017 9:15 AM Medical Record  Number: [id number] Patient Account Number: [id number] Date of Birth/Sex: [birth year] (51 y.o. Female) Treating RN: Montey Hora Primary Care Provider: Delight Stare Other Clinician: Referring Provider: Delight Stare Treating Provider/Extender: Melburn Hake, HOYT Weeks in Treatment: 15 Subjective Chief Complaint Information obtained from Patient Patients presents for treatment of an open diabetic ulcer with an arterial etiology on her right lateral ankle and right gluteal area History of Present Illness (HPI) The following HPI elements were documented for the patient's wound: Location: right lateral ankle and right gluteal area Quality: Patient reports experiencing a sharp pain to affected area(s). Severity: Patient states wound are getting worse. Duration: Patient has had the wound for > 4 months prior to seeking treatment at the wound center Timing: Pain in wound is constant (hurts all the time) Context: The wound occurred when the patient was in hospital with a necrotizing fasciitis of the right gluteal area and a ulceration on the right ankle Modifying Factors: Other treatment(s) tried include:admitted to the hospital for IV antibiotics and a full workup and has also had a recent angiogram Associated Signs and Symptoms: Patient reports having increase discharge. 51 year old patient was sent to [place] from [facility] where she was seen by Dr. Sherril Cong for a left ankle ulceration and was recently hospitalized with hypotension and  sepsis. She was treated with IV antibiotics and has been scheduled to see Dr. Sharol Given. She was seen by vascular surgery who recommended a femoral bypass but surgery has been delayed until her sacral wound from last year's necrotizing fasciitis has healed. Was seeing the wound care team at [facility] but wanted to change over. She is a smoker and smokes a pack of cigarettes a day The patient was recently admitted in [place] between February 2 and February 14. She had a follow- up to see vascular surgery, orthopedic surgery and infectious disease. 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 Review of the electronic medical records reveals that Dr. 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 An MRI showed edema in the bone which was consistent with osteomyelitis and the chronic ulcer and may need surgical intervention. She also had a sacral decubitus ulcer which was treated with the wound VAC. On 01/07/2017 she was taken up by the vascular surgeon Dr. Trula Slade for an abdominal aortogram and bilateral lower extremity runoff, for a history of having bilateral femoropopliteal bypass graft as well as external iliac stenting on the right and stenting of her bypass graft. She had developed a nonhealing wound on her right ankle and there was a possibility of a femoral occlusion. the findings were noted and the TESSLA, [place]. ([id number]) impression was a surgical revascularization with a aorto bifemoral bypass graft. Both the femoropopliteal bypass grafts were patent. 01/31/2017 --  x-ray of the right hip and pelvis -- IMPRESSION:No radiographic evidence of acute osteomyelitis. Normal-appearing right hip joint space for age. No acute bony abnormality of the hip. 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 Since seeing her last week she has not had an appointment with infectious disease or the orthopedic specialist  yet. 02/06/2017 -- the patient missed a couple of appointments yesterday due to the weather but other than that has apparently given up smoking for the last 5 days. 02/13/2017 -- she has rescheduled her infectious disease appointment and also the appointment with the orthopedic surgeon Dr. Sharol Given 03/06/2017 -- she was seen by Dr. Lucianne Lei dam of infectious disease on 03/05/2017 and he recommendedIV antibiotics but the patient did not want to have that and he has given her Augmentin and doxycycline and will reevaluate her in 2 months time. 03/13/2017 -- he saw Dr. Trula Slade regarding her vascular issues and he would like to wait to the sacral wound is completely closed before considering aortobifemoral bypass graft. He will see her back in 2 months time. She was also seen by Dr. Sharol Given of orthopedics who recommended continue wound care dressings and follow in the office in about 2 months time. he also recommended 3 view radiographs of the right ankle at follow-up. 03/28/2017 -- she did have a PICC line placed and Dr. Lucianne Lei dam has begun IV vancomycin and ceftriaxone. she is going to continue this until she sees him in approximately a month's time 04/18/2017 -- we had applied for a skin substitute for the patient's care but her copayment is going to be about $300 a piece and the patient will not be able to afford this. 05/08/17 on evaluation today patient appears to potentially have a fungal rash in the periwound region of the sacral area. This also seems to extend into the inguinal creases and factional region as well.  She is tender to palpation with light rubbing over the region of the periwound and the skin in this region does have a beefy red appearance. Everything seems to point to a fungal infection at this time. She has been tolerating the dressing changes up to this point. There is no evidence of infection otherwise. Objective Constitutional Well-nourished and well-hydrated in no acute distress. Vitals Time Taken: 9:22 AM, Height: 67 in, Weight: 137 lbs, BMI: 21.5, Temperature: 97.9 F, Pulse: 86 bpm, Respiratory Rate: 16 breaths/min, Blood Pressure: 85/50 mmHg. Respiratory normal breathing without difficulty. clear to auscultation bilaterally. [name], [name] ([id number]) Psychiatric this patient is able to make decisions and demonstrates good insight into disease process. Alert and Oriented x 3. pleasant and cooperative. General Notes: Other than the fact that patient has what appears to be a fungal infection in the periwound area of her sacral wound I feel like she is doing fairly well today and no debridement was required. Integumentary (Hair, Skin) Wound #1 status is Open. Original cause of wound was Gradually Appeared. The wound is located on the Right,Lateral Malleolus. The wound measures 1.2cm length x 1.5cm width x 0.2cm depth; 1.414cm^2 area and 0.283cm^3 volume. There is Fat Layer (Subcutaneous Tissue) Exposed exposed. There is no tunneling or undermining noted. There is a large amount of purulent drainage noted. The wound margin is distinct with the outline attached to the wound base. There is large (67-100%) pink granulation within the wound bed. There is a small (1-33%) amount of necrotic tissue within the wound bed including Adherent Slough. The periwound skin appearance exhibited: Induration, Maceration. The periwound skin appearance did not exhibit: Callus, Crepitus, Excoriation, Rash, Scarring, Dry/Scaly, Atrophie Blanche, Cyanosis, Ecchymosis, Hemosiderin Staining, Mottled,  Pallor, Rubor, Erythema. Periwound temperature was noted as No Abnormality. The periwound has tenderness on palpation. Wound #2 status is Open. Original cause of wound was Gradually Appeared. The wound is located on the Right Gluteus. The  wound measures 2.3cm length x 1.8cm width x 2cm depth; 3.252cm^2 area and 6.503cm^3 volume. There is no tunneling or undermining noted. There is a large amount of serous drainage noted. The wound margin is distinct with the outline attached to the wound base. There is medium (34-66%) red, pink granulation within the wound bed. There is a medium (34-66%) amount of necrotic tissue within the wound bed including Adherent Slough. The periwound skin appearance exhibited: Maceration. Periwound temperature was noted as No Abnormality. The periwound has tenderness on palpation. 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 ([id number]) Wound Cleansing: Wound #1 Right,Lateral Malleolus: Clean wound with Normal Saline. Wound #2 Right Gluteus: Clean wound with Normal Saline. Anesthetic: Wound #1 Right,Lateral Malleolus: Topical Lidocaine 4% cream applied to wound bed prior to debridement - in clinic Wound #2 Right Gluteus: Topical Lidocaine 4% cream applied to wound bed prior to debridement - in clinic Skin Barriers/Peri-Wound Care: Wound #1 Right,Lateral Malleolus: Skin Prep Wound #2 Right Gluteus: Skin Prep Antifungal cream - per prescription as ordered Primary Wound Dressing: Wound #1 Right,Lateral Malleolus: Prisma Ag - please pack into the  undermining's. Wound #2 Right Gluteus: Prisma Ag - please pack into the undermining's. Secondary Dressing: Wound #1 Right,Lateral Malleolus: Dry Gauze Boardered Foam Dressing Wound #2 Right Gluteus: ABD pad - secure with tape Dry Gauze Dressing Change Frequency: Wound #1 Right,Lateral Malleolus: Change dressing every day. Wound #2 Right Gluteus: Change dressing every day. Follow-up Appointments: Wound #1 Right,Lateral Malleolus: Return Appointment in 1 week. Wound #2 Right Gluteus: Return Appointment in 1 week. Additional Orders / Instructions: Wound #1 Right,Lateral Malleolus: Stop Smoking Increase protein intake. Activity as tolerated Other: - Please add vitamin A, vitamin C and zinc supplements to your diet Wound #2 Right Gluteus: Stop Smoking Increase protein intake. Activity as tolerated Other: - Please add vitamin A, vitamin C and zinc supplements to your diet Home Health: CRISOL, MUECKE ([id number]) Wound #1 Right,Lateral Malleolus: [place] Visits - [place] Nurse may visit PRN to address patient s wound care needs. FACE TO FACE ENCOUNTER: MEDICARE and MEDICAID PATIENTS: I certify that this patient is under my care and that I had a face-to-face encounter that meets the physician face-to-face encounter requirements with this patient on this date. The encounter with the patient was in whole or in part for the following MEDICAL CONDITION: (primary reason for [place]) MEDICAL NECESSITY: I certify, that based on my findings, NURSING services are a medically necessary home health service. HOME BOUND STATUS: I certify that my clinical findings support that this patient is homebound (i.e., Due to illness or injury, pt requires aid of supportive devices such as crutches, cane, wheelchairs, walkers, the use of special transportation or the assistance of another person to leave their place of residence. There is a normal inability  to leave the home and doing so requires considerable and taxing effort. Other absences are for medical reasons / religious services and are infrequent or of short duration when for other reasons). If current dressing causes regression in wound condition, may D/C ordered dressing product/s and apply Normal Saline Moist Dressing daily  until next [place] / Other MD appointment. [place] of regression in wound condition at [phone]. Please direct any NON-WOUND related issues/requests for orders to patient's Primary Care Physician Wound #2 Right Gluteus: [place] Nurse may visit PRN to address patient s wound care needs. FACE TO FACE ENCOUNTER: MEDICARE and MEDICAID PATIENTS: I certify that this patient is under my care and that I had a face-to-face encounter that meets the physician face-to-face encounter requirements with this patient on this date. The encounter with the patient was in whole or in part for the following MEDICAL CONDITION: (primary reason for [place]) MEDICAL NECESSITY: I certify, that based on my findings, NURSING services are a medically necessary home health service. HOME BOUND STATUS: I certify that my clinical findings support that this patient is homebound (i.e., Due to illness or injury, pt requires aid of supportive devices such as crutches, cane, wheelchairs, walkers, the use of special transportation or the assistance of another person to leave their place of residence. There is a normal inability to leave the home and doing so requires considerable and taxing effort. Other absences are for medical reasons / religious services and are infrequent or of short duration when for other reasons). If current dressing causes regression in wound condition, may D/C ordered dressing product/s and apply Normal Saline Moist Dressing daily until next [place] / Other MD  appointment. [place] of regression in wound condition at [phone]. Please direct any NON-WOUND related issues/requests for orders to patient's Primary Care Physician Negative Pressure Wound Therapy: Wound #2 Right Gluteus: Place NPWT on HOLD. General Notes: I'm going to recommend that we hold the wound VAC for the next week. We will pack the wound with Prisma and then I am going to place her on a mixture of triamcinolone were .1%, zinc, nystatin 100,000 units mixed 1:1:1 and applied to the sacral periwound, inguinal increases, and gluteal region daily. Hopefully this will help treat this issue which has been somewhat tender for her up to this point. We will see her for reevaluation in one week. If anything worsens in the interim she will contact the office and let us know otherwise hopefully she will be improved in regard to the fungal rash next week. [name], [name] ([id number]) Electronic Signature(s) Signed: 05/08/2017 5:49:59 PM By: Worthy Keeler PA-C Entered By: Worthy Keeler on 05/08/2017 17:30:53 Danzer, [name] ([id number]) -------------------------------------------------------------------------------- SuperBill Details Patient Name: [name]. Date of Service: 05/08/2017 Medical Record Number: [id number] Patient Account Number: [id number] Date of Birth/Sex: 02-Apr-1966 (51 y.o. 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 M86.371  Chronic multifocal osteomyelitis, right ankle and foot Facility Procedures CPT4 Code: [id number] Description: 99213 - WOUND CARE VISIT-LEV 3 EST PT Modifier: Quantity: 1 Physician Procedures CPT4 Code Description: [id number] 99213 - WC PHYS LEVEL 3 - EST PT ICD-10 Description Diagnosis E11.621 Type 2 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

## 2017-05-12 NOTE — Progress Notes (Signed)
Subjective:    Cc: wound on ankle   Patient ID: [name], female    DOB: 12-14-1965, 51 y.o.   MRN: [id number]  HPI  51 y.o. female with  diabetic neuropathy diabetic foot ulcer peripheral vascular disease status post bypass surgery with evidence of osteomyelitis on MRI in the [facility] in February of 2018. She also has a sacral wound that is not healed up completely where she had prior necrotizing fasciitis.I saw her in the hospital and she did not want to undergo surgical intervention. She tells me that Dr Sharol Given is concerned that if he performs at biopsy or debridement on this call it will lead to her losing her leg. I have indeed concerned that she mailed to my need a below the knee or above the knee and dictation to cure osteomyelitis at this site. I proposed the idea of her going on empiric IV antibiotics but she was not very eager to do that initially . Therefore I initially placed her  doxycycline twice daily which she has come off of now and also Augmentin. She had trouble tolerating this regimen with nausea and diarrhea. She ultimately decided to try IV abbx  We then placed her on IV vancomycin and ceftriaxone x 6 weeks. NOTE her vancomycin levels were very haphazard suggesting that she was not optimally or consistently compliant with her antibiotics.  She claims that her wound on her foot is healing up somewhat. She ONCE AGAIN does NOT want me to examine her sacral wound. This previously was where she had Nec fascitis but was not to bone--though wound care classifies as STAGE IV.  We have switched her to DS Bactrim 2 BID since finishing her abx.  Her ESR and CRP remain elevated at 76 and 2.8.  I again discussed that she would need amputation to cure her osteo in her fibula but that we could try to continue suppressive antibiotics. THe other glaring problem that she does not seem to want me to monitor is her stage IV decubitus ulcer on the right.    Past Medical History:    Diagnosis Date  . Arthritis   . Asthma   . Chronic back pain   . Depression   . Diabetes mellitus   . Diabetic neuropathy ([place])   . Family history of adverse reaction to anesthesia    mother had difficlty waking   . GERD (gastroesophageal reflux disease)   . Hyperlipidemia   . Joint pain   . Leg pain    With Walking  . Osteomyelitis of right fibula ([place]) 03/05/2017  . PAD (peripheral artery disease) ([place])   . Reflux   . Ulcer    Foot    Past Surgical History:  Procedure Laterality Date  . ABDOMINAL AORTAGRAM  June 15, 2014  . ABDOMINAL AORTAGRAM N/A 06/15/2014   Procedure: ABDOMINAL Maxcine Ham;  Surgeon: Serafina Mitchell, MD;  Location: [facility] CATH LAB;  Service: Cardiovascular;  Laterality: N/A;  . ABDOMINAL AORTAGRAM N/A 11/22/2014   Procedure: ABDOMINAL AORTAGRAM;  Surgeon: Serafina Mitchell, MD;  Location: [facility] CATH LAB;  Service: Cardiovascular;  Laterality: N/A;  . ABDOMINAL AORTOGRAM W/LOWER EXTREMITY N/A 01/07/2017   Procedure: Abdominal Aortogram w/Lower Extremity;  Surgeon: Serafina Mitchell, MD;  Location: [place] CV LAB;  Service: Cardiovascular;  Laterality: N/A;  . ARTERIAL BYPASS SURGRY  07/05/2010   Right Common Femoral to below knee popliteal BPG  . BACK SURGERY     X's  2  . CARDIAC  CATHETERIZATION    . CHOLECYSTECTOMY     Gall Bladder  . CYSTECTOMY Right    foot  . CYSTECTOMY Left    wrist  . INTERCOSTAL NERVE BLOCK  November 2015  . IR FLUORO GUIDE CV LINE RIGHT  03/20/2017  . IR US GUIDE VASC ACCESS RIGHT  03/20/2017  . IRRIGATION AND DEBRIDEMENT BUTTOCKS Right 09/30/2016   Procedure: DEBRIDEMENT RIGHT  BUTTOCK WOUND;  Surgeon: Georganna Skeans, MD;  Location: [place];  Service: General;  Laterality: Right;  . left foot surgery    . left wrist cyst removal Left   . PERIPHERAL VASCULAR CATHETERIZATION N/A 05/07/2016   Procedure: Abdominal Aortogram;  Surgeon: Serafina Mitchell, MD;  Location: [place] CV LAB;  Service: Cardiovascular;  Laterality: N/A;  . PERIPHERAL  VASCULAR CATHETERIZATION N/A 05/07/2016   Procedure: Lower Extremity Angiography;  Surgeon: Serafina Mitchell, MD;  Location: [place] CV LAB;  Service: Cardiovascular;  Laterality: N/A;  . PERIPHERAL VASCULAR CATHETERIZATION N/A 05/07/2016   Procedure: Aortic Arch Angiography;  Surgeon: Serafina Mitchell, MD;  Location: [place] CV LAB;  Service: Cardiovascular;  Laterality: N/A;  . PERIPHERAL VASCULAR CATHETERIZATION N/A 05/07/2016   Procedure: Upper Extremity Angiography;  Surgeon: Serafina Mitchell, MD;  Location: [place] CV LAB;  Service: Cardiovascular;  Laterality: N/A;  . PERIPHERAL VASCULAR CATHETERIZATION Right 05/07/2016   Procedure: Peripheral Vascular Balloon Angioplasty;  Surgeon: Serafina Mitchell, MD;  Location: [place] CV LAB;  Service: Cardiovascular;  Laterality: Right;  subclavian  . PERIPHERAL VASCULAR CATHETERIZATION Right 05/07/2016   Procedure: Peripheral Vascular Intervention;  Surgeon: Serafina Mitchell, MD;  Location: [place] CV LAB;  Service: Cardiovascular;  Laterality: Right;  External  Iliac  . SKIN GRAFT Right 2012   [name] by Dr. Nils Pyle- Right and Left Ankle  . SPINE SURGERY    . TONSILLECTOMY      Family History  Problem Relation Age of Onset  . Coronary artery disease Mother   . Peripheral vascular disease Mother   . Heart disease Mother        Before age 29  . Other Mother        Venous insuffiency  . Diabetes Mother   . Hyperlipidemia Mother   . Hypertension Mother   . Varicose Veins Mother   . Heart attack Mother        before age 35  . Heart disease Father   . Diabetes Father   . Diabetes Maternal Grandmother   . Diabetes Paternal Grandmother   . Diabetes Paternal Grandfather   . Diabetes Sister   . Hypertension Sister   . Diabetes Brother   . Hypertension Brother       Social History   Social History  . Marital status: Married    Spouse name: N/A  . Number of children: N/A  . Years of education: N/A   Occupational History  .  disabled    Social History Main Topics  . Smoking status: Former Smoker    Years: 30.00    Types: Cigarettes    Quit date: 02/07/2017  . Smokeless tobacco: Never Used  . Alcohol use No  . Drug use: No  . Sexual activity: Yes    Birth control/ protection: Post-menopausal   Other Topics Concern  . Not on file   Social History Narrative  . No narrative on file    Allergies  Allergen Reactions  . Penicillins Other (See Comments)    Severe Headache (not so  sure about this now) Has patient had a PCN reaction causing immediate rash, facial/tongue/throat swelling, SOB or lightheadedness with hypotension: No Has patient had a PCN reaction causing severe rash involving mucus membranes or skin necrosis: No Has patient had a PCN reaction that required hospitalization: No Has patient had a PCN reaction occurring within the last 10 years: No If all of the above answers are "NO", then may proceed with Cephalosporin use.      Current Outpatient Prescriptions:  .  albuterol (PROVENTIL) 2 MG tablet, Take 2 mg by mouth 3 (three) times daily as needed for shortness of breath. , Disp: , Rfl:  .  amoxicillin-clavulanate (AUGMENTIN) 875-125 MG tablet, Take 1 tablet by mouth 2 (two) times daily., Disp: 60 tablet, Rfl: 5 .  aspirin 81 MG tablet, Take 81 mg by mouth daily. , Disp: , Rfl:  .  atorvastatin (LIPITOR) 40 MG tablet, Take 1 tablet (40 mg total) by mouth daily., Disp: 90 tablet, Rfl: 3 .  cetirizine (ZYRTEC) 10 MG tablet, Take 10 mg by mouth daily as needed for allergies. Reported on 03/27/2016, Disp: , Rfl:  .  cyclobenzaprine (FLEXERIL) 10 MG tablet, Limit 1 tablet by mouth 1  to  3 times per day if tolerated (Patient taking differently: Take 10 mg by mouth 3 (three) times daily as needed for muscle spasms. ), Disp: 90 tablet, Rfl: 0 .  dicyclomine (BENTYL) 10 MG capsule, Take 10 mg by mouth 4 (four) times daily as needed for spasms., Disp: , Rfl:  .  doxycycline (VIBRA-TABS) 100 MG tablet,  Take 1 tablet (100 mg total) by mouth 2 (two) times daily., Disp: 60 tablet, Rfl: 5 .  furosemide (LASIX) 40 MG tablet, Take 40 mg by mouth 2 (two) times daily as needed for fluid. , Disp: , Rfl:  .  HUMALOG KWIKPEN 100 UNIT/ML KiwkPen, Inject 10 Units into the skin 3 (three) times daily. Per sliding scale, Disp: , Rfl:  .  HYDROcodone-acetaminophen (NORCO) 10-325 MG tablet, Limit 1 tablet by mouth per day or twice per day if tolerated (Patient taking differently: Take 1 tablet by mouth See admin instructions. Every 8-12 hours), Disp: 60 tablet, Rfl: 0 .  LANTUS SOLOSTAR 100 UNIT/ML Solostar Pen, Inject 36 Units into the skin 2 (two) times daily. , Disp: , Rfl:  .  LORazepam (ATIVAN) 0.5 MG tablet, Take 1 tablet 1 hour prior to PICC insertion.  May take 2nd pill at hospital if needed., Disp: 2 tablet, Rfl: 0 .  meclizine (ANTIVERT) 25 MG tablet, Take 25 mg by mouth 3 (three) times daily as needed for dizziness., Disp: , Rfl:  .  midodrine (PROAMATINE) 10 MG tablet, Take 1 tablet (10 mg total) by mouth 3 (three) times daily with meals., Disp: 90 tablet, Rfl: 0 .  omeprazole (PRILOSEC) 40 MG capsule, Take 40 mg by mouth daily., Disp: , Rfl:  .  ondansetron (ZOFRAN ODT) 4 MG disintegrating tablet, Take 1 tablet (4 mg total) by mouth every 8 (eight) hours as needed for nausea or vomiting., Disp: 20 tablet, Rfl: 0 .  pregabalin (LYRICA) 100 MG capsule, Limit 1 tab by mouth twice a day to 3 times a day if tolerated (Patient taking differently: Take 100 mg by mouth 3 (three) times daily. ), Disp: 90 capsule, Rfl: 0 .  pseudoephedrine-guaifenesin (MUCINEX D) 60-600 MG per tablet, Take 1 tablet by mouth every 12 (twelve) hours as needed for congestion. Reported on 04/08/2016, Disp: , Rfl:  .  sulfamethoxazole-trimethoprim (BACTRIM  DS,SEPTRA DS) 800-160 MG tablet, Take 2 tablets by mouth 2 (two) times daily., Disp: 120 tablet, Rfl: 0 No current facility-administered medications for this visit.    Facility-Administered Medications Ordered in Other Visits:  .  0.9 %  sodium chloride infusion, , Intravenous, Continuous, Brabham, Butch Penny, MD    Review of Systems  Constitutional: Negative for chills and fever.  HENT: Negative for congestion and sore throat.   Eyes: Negative for photophobia.  Respiratory: Negative for cough, shortness of breath and wheezing.   Cardiovascular: Negative for chest pain, palpitations and leg swelling.  Gastrointestinal: Positive for diarrhea. Negative for abdominal pain, blood in stool, constipation, nausea and vomiting.  Genitourinary: Negative for dysuria, flank pain and hematuria.  Musculoskeletal: Positive for myalgias. Negative for back pain.  Skin: Positive for wound. Negative for rash.  Neurological: Negative for dizziness, weakness and headaches.  Hematological: Does not bruise/bleed easily.  Psychiatric/Behavioral: Negative for suicidal ideas.       Objective:   Physical Exam  Constitutional: She is oriented to person, place, and time. She appears well-developed and well-nourished. No distress.  HENT:  Head: Normocephalic and atraumatic.  Mouth/Throat: No oropharyngeal exudate.  Eyes: Conjunctivae and EOM are normal. No scleral icterus.  Neck: Normal range of motion. Neck supple.  Cardiovascular: Normal rate and regular rhythm.   Pulmonary/Chest: Effort normal. No respiratory distress. She has no wheezes.  Abdominal: She exhibits no distension.  Musculoskeletal: She exhibits no edema or tenderness.  Neurological: She is alert and oriented to person, place, and time. She exhibits normal muscle tone. Coordination normal.  Skin: Skin is warm. She is not diaphoretic. There is erythema.  Psychiatric: She has a normal mood and affect. Her behavior is normal. Judgment and thought content normal.   Wound over fibula 03/05/17: some purulent material from wound     Same wound today 05/12/17:            Assessment & Plan:   Chronic  wound over ankle with osteomyelitis of the fibula  I personally DO NOT think she is making progress with re to her external wounds appearance but more importantly the underlying osteomyelitis will NOT be curable with antibiotics, revascularization either. She does not want to lose this limb but to cure this infection and certainly to ain control of it she will need this ultimately. Again the stage IV ulcer she doesn't want me to examine is another very big problem if it indeed is involving bone.  I will check safety labs today  Chronic wound over her buttocks: I will insist on direct visualization at next visit and also imaging of her leg and her ankle again  Peripheral vascular disease post revascularization without good options   Diabetes mellitus: Poorly controlled .  Loose stools with eating: does not sound like CDI but she is at risk for this  I spent greater than 40 minutes with the patient including greater than 50% of time in face to face counsel of the patient guarding her chronic osteomyelitis in her fibula her wound and also her wound on her buttocks, her PVD, diarrhea, DM  and in coordination of her care.

## 2017-05-13 ENCOUNTER — Encounter (INDEPENDENT_AMBULATORY_CARE_PROVIDER_SITE_OTHER): Payer: Self-pay | Admitting: Orthopedic Surgery

## 2017-05-13 ENCOUNTER — Ambulatory Visit (INDEPENDENT_AMBULATORY_CARE_PROVIDER_SITE_OTHER): Payer: Medicare Other | Admitting: Orthopedic Surgery

## 2017-05-13 ENCOUNTER — Telehealth: Payer: Self-pay | Admitting: *Deleted

## 2017-05-13 DIAGNOSIS — R7989 Other specified abnormal findings of blood chemistry: Secondary | ICD-10-CM

## 2017-05-13 DIAGNOSIS — I70233 Atherosclerosis of native arteries of right leg with ulceration of ankle: Secondary | ICD-10-CM | POA: Insufficient documentation

## 2017-05-13 DIAGNOSIS — L97311 Non-pressure chronic ulcer of right ankle limited to breakdown of skin: Secondary | ICD-10-CM | POA: Diagnosis not present

## 2017-05-13 LAB — C-REACTIVE PROTEIN: CRP: 2.1 mg/L (ref <8.0)

## 2017-05-13 LAB — SEDIMENTATION RATE: Sed Rate: 13 mm/hr (ref 0–30)

## 2017-05-13 NOTE — Telephone Encounter (Signed)
-----   Message from Truman Hayward, MD sent at 05/13/2017  8:18 AM EDT ----- I would like [name] to drop her bactrim dose to ONE DS tablet BID and have her BMP rechecked by Thursday

## 2017-05-13 NOTE — Progress Notes (Signed)
Office Visit Note   Patient: [name]           Date of Birth: 07/19/1966           MRN: [id number] Visit Date: 05/13/2017              Requested by: [name], [address] [place], [place] [address] PCP: [name], MD  Chief Complaint  Patient presents with  . Right Ankle - Follow-up, Wound Check      HPI: Patient presents in follow-up for a ischemic ulcer lateral malleolus right ankle. Of note patient has been treated for sacral decubitus ulcer she states is getting smaller she states she's had increased skin maceration breakdown and the wound VAC has been discontinued until the periwound area has resolved. Patient is also recently seen vascular surgery and once her decubitus ulcer has resolved patient would be a candidate for revascularization. Patient denies any fever or chills. She states that she has stop smoking this year.  Assessment & Plan: Visit Diagnoses:  1. Non-pressure chronic ulcer of right ankle limited to breakdown of skin ([place])   2. 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  Follow-Up Instructions: Return in about 4 weeks (around 06/10/2017).   Ortho Exam  Patient is alert, oriented, no adenopathy, well-dressed, normal affect, normal respiratory effort. Examination she has an antalgic gait. She does not have a palpable pulse. She has an ulcer with fibrinous tissue at the base of the wound. After debridement the base of the wound has good healthy granulation tissue the ulcers 15 mm in diameter and 3 mm deep. There is no exposed bone or tendon there is no purulent drainage there is no cellulitis. Iodosorb 4 x 4 and a Coban dressing was applied.  Imaging: No results found.  Labs: Lab Results  Component Value Date   HGBA1C 10.8 (H) 12/31/2016   HGBA1C 11.2 (H) 12/24/2016   HGBA1C  13.1 (H) 06/27/2011   ESRSEDRATE 13 05/12/2017   ESRSEDRATE 76 (H) 04/30/2017   ESRSEDRATE 50 (H) 04/16/2017   CRP 2.1 05/12/2017   CRP 2.8 (H) 04/30/2017   CRP <0.8 04/16/2017   REPTSTATUS 12/25/2016 FINAL 12/23/2016   GRAMSTAIN  09/30/2016    ABUNDANT WBC PRESENT,BOTH PMN AND MONONUCLEAR ABUNDANT GRAM NEGATIVE RODS ABUNDANT GRAM POSITIVE COCCI IN PAIRS IN CLUSTERS FEW GRAM POSITIVE RODS    CULT MULTIPLE SPECIES PRESENT, SUGGEST RECOLLECTION (A) 12/23/2016    Orders:  No orders of the defined types were placed in this encounter.  No orders of the defined types were placed in this encounter.    Procedures: No procedures performed  Clinical Data: No additional findings.  ROS:  All other systems negative, except as noted in the HPI. Review of Systems  Objective: Vital Signs: There were no vitals taken for this visit.  Specialty Comments:  No specialty comments available.  PMFS History: Patient Active Problem List   Diagnosis Date Noted  . 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 Osteomyelitis of right fibula ([place]) 03/05/2017  . Non-pressure chronic ulcer of right ankle limited to breakdown of skin ([place]) 01/17/2017  . Elevated lactic acid level   . Osteomyelitis of ankle ([place]) 12/29/2016  . Chronic pain 12/24/2016  . Buttock wound, left,  subsequent encounter   . Hypotension 12/23/2016  . IDDM (insulin dependent diabetes mellitus) ([place]) 12/23/2016  . AKI (acute kidney injury) ([place]) 12/23/2016  . Malnutrition ([place]) 12/23/2016  . Thrush 12/23/2016  . Sepsis ([place]) 11/27/2016  . DM2 (diabetes mellitus, type 2) ([place]) 11/27/2016  . Chronic back pain 11/27/2016  . Orthostatic hypotension 11/27/2016  . Demand ischemia ([place])   . Other hyperlipidemia   . Right sided weakness   . Retinal tumor of right eye   . Pressure injury of skin 10/01/2016  . Necrotizing fasciitis ([place]) 09/30/2016  . Bilateral  subclavian artery occlusion 03/05/2016  . Paresthesia of both hands 03/05/2016  . DDD (degenerative disc disease), lumbosacral 03/26/2015  . DDD (degenerative disc disease), cervical 03/26/2015  . Sacroiliac joint disease 03/26/2015  . Occipital neuralgia 03/26/2015  . Pain in limb-Bilateral Leg 04/25/2014  . PAD (peripheral artery disease) ([place]) 04/05/2013  . Chronic total occlusion of artery of the extremities ([place]) 02/10/2012   Past Medical History:  Diagnosis Date  . Arthritis   . Asthma   . Chronic back pain   . Depression   . Diabetes mellitus   . Diabetic neuropathy ([place])   . Family history of adverse reaction to anesthesia    mother had difficlty waking   . GERD (gastroesophageal reflux disease)   . Hyperlipidemia   . Joint pain   . Leg pain    With Walking  . Osteomyelitis of right fibula ([place]) 03/05/2017  . PAD (peripheral artery disease) ([place])   . Reflux   . Ulcer    Foot    Family History  Problem Relation Age of Onset  . Coronary artery disease Mother   . Peripheral vascular disease Mother   . Heart disease Mother        Before age 27  . Other Mother        Venous insuffiency  . Diabetes Mother   . Hyperlipidemia Mother   . Hypertension Mother   . Varicose Veins Mother   . Heart attack Mother        before age 47  . Heart disease Father   . Diabetes Father   . Diabetes Maternal Grandmother   . Diabetes Paternal Grandmother   . Diabetes Paternal Grandfather   . Diabetes Sister   . Hypertension Sister   . Diabetes Brother   . Hypertension Brother     Past Surgical History:  Procedure Laterality Date  . ABDOMINAL AORTAGRAM  June 15, 2014  . ABDOMINAL AORTAGRAM N/A 06/15/2014   Procedure: ABDOMINAL Maxcine Ham;  Surgeon: Serafina Mitchell, MD;  Location[facility] CATH LAB;  Service: Cardiovascular;  Laterality: N/A;  . ABDOMINAL AORTAGRAM N/A 11/22/2014   Procedure: ABDOMINAL AORTAGRAM;  Surgeon: Serafina Mitchell, MD;  Location: [facility] CATH LAB;  Service:  Cardiovascular;  Laterality: N/A;  . ABDOMINAL AORTOGRAM W/LOWER EXTREMITY N/A 01/07/2017   Procedure: Abdominal Aortogram w/Lower Extremity;  Surgeon: Serafina Mitchell, MD;  Location: [place] [facility] CV LAB;  Service: Cardiovascular;  Laterality: N/A;  . ARTERIAL BYPASS SURGRY  07/05/2010   Right Common Femoral to below knee popliteal BPG  . BACK SURGERY     X's  2  . CARDIAC CATHETERIZATION    . CHOLECYSTECTOMY     Gall Bladder  . CYSTECTOMY Right    foot  . CYSTECTOMY Left    wrist  . INTERCOSTAL NERVE BLOCK  November 2015  . IR FLUORO GUIDE CV LINE RIGHT  03/20/2017  . IR US GUIDE VASC  ACCESS RIGHT  03/20/2017  . IRRIGATION AND DEBRIDEMENT BUTTOCKS Right 09/30/2016   Procedure: DEBRIDEMENT RIGHT  BUTTOCK WOUND;  Surgeon: Georganna Skeans, MD;  Location: [name];  Service: General;  Laterality: Right;  . left foot surgery    . left wrist cyst removal Left   . PERIPHERAL VASCULAR CATHETERIZATION N/A 05/07/2016   Procedure: Abdominal Aortogram;  Surgeon: Serafina Mitchell, MD;  Location: [place] CV LAB;  Service: Cardiovascular;  Laterality: N/A;  . PERIPHERAL VASCULAR CATHETERIZATION N/A 05/07/2016   Procedure: Lower Extremity Angiography;  Surgeon: Serafina Mitchell, MD;  Location: [facility] CV LAB;  Service: Cardiovascular;  Laterality: N/A;  . PERIPHERAL VASCULAR CATHETERIZATION N/A 05/07/2016   Procedure: Aortic Arch Angiography;  Surgeon: Serafina Mitchell, MD;  Location: [facility] CV LAB;  Service: Cardiovascular;  Laterality: N/A;  . PERIPHERAL VASCULAR CATHETERIZATION N/A 05/07/2016   Procedure: Upper Extremity Angiography;  Surgeon: Serafina Mitchell, MD;  Location: [facility] CV LAB;  Service: Cardiovascular;  Laterality: N/A;  . PERIPHERAL VASCULAR CATHETERIZATION Right 05/07/2016   Procedure: Peripheral Vascular Balloon Angioplasty;  Surgeon: Serafina Mitchell, MD;  Location: [facility] CV LAB;  Service: Cardiovascular;  Laterality: Right;  subclavian  . PERIPHERAL VASCULAR CATHETERIZATION  Right 05/07/2016   Procedure: Peripheral Vascular Intervention;  Surgeon: Serafina Mitchell, MD;  Location: [facility] CV LAB;  Service: Cardiovascular;  Laterality: Right;  External  Iliac  . SKIN GRAFT Right 2012   [name] by Dr. Nils Pyle- Right and Left Ankle  . SPINE SURGERY    . TONSILLECTOMY     Social History   Occupational History  . disabled    Social History Main Topics  . Smoking status: Former Smoker    Years: 30.00    Types: Cigarettes    Quit date: 02/07/2017  . Smokeless tobacco: Never Used  . Alcohol use No  . Drug use: No  . Sexual activity: Yes    Birth control/ protection: Post-menopausal

## 2017-05-13 NOTE — Telephone Encounter (Signed)
Relayed instructions to patient. She repeated and confirmed that she will take 1 bactrim tablet in the morning and 1 bactrim in the evening. She will come Thursday at 3:30 for repeat BMP per verbal order from Dr [name].  Order placed. Landis Gandy, RN

## 2017-05-13 NOTE — Addendum Note (Signed)
Addended by: Lianne Cure A on: 05/13/2017 04:40 PM   Modules accepted: Orders

## 2017-05-13 NOTE — Addendum Note (Signed)
Addended by: Lianne Cure A on: 05/13/2017 01:44 PM   Modules accepted: Orders

## 2017-05-15 ENCOUNTER — Other Ambulatory Visit: Payer: Medicare Other

## 2017-05-15 DIAGNOSIS — R7989 Other specified abnormal findings of blood chemistry: Secondary | ICD-10-CM

## 2017-05-15 NOTE — Addendum Note (Signed)
Encounter addended by: Landis Martins, RN on: 05/15/2017  8:38 AM<BR>    Actions taken: Charge Capture section accepted

## 2017-05-16 ENCOUNTER — Encounter: Payer: Medicare Other | Admitting: Surgery

## 2017-05-16 DIAGNOSIS — E11621 Type 2 diabetes mellitus with foot ulcer: Secondary | ICD-10-CM | POA: Diagnosis not present

## 2017-05-16 LAB — BASIC METABOLIC PANEL
BUN: 32 mg/dL — AB (ref 7–25)
CHLORIDE: 100 mmol/L (ref 98–110)
CO2: 20 mmol/L (ref 20–31)
CREATININE: 1.29 mg/dL — AB (ref 0.50–1.05)
Calcium: 9.5 mg/dL (ref 8.6–10.4)
GLUCOSE: 289 mg/dL — AB (ref 65–99)
Potassium: 5.7 mmol/L — ABNORMAL HIGH (ref 3.5–5.3)
Sodium: 137 mmol/L (ref 135–146)

## 2017-05-18 NOTE — Progress Notes (Signed)
[name] ([id number]) Visit Report for 05/16/2017 Arrival Information Details Patient Name: [name], [name]. Date of Service: 05/16/2017 2:00 PM Medical Record Number: [id number] Patient Account Number: [id number] Date of Birth/Sex: [birth year] (51 y.o. Female) Treating RN: Cornell Barman Primary Care Vashti Bolanos: Delight Stare Other Clinician: Referring Duwan Adrian: Delight Stare Treating Semir Brill/Extender: Frann Rider in Treatment: 16 Visit Information History Since Last Visit Added or deleted any medications: No Patient Arrived: Walker Any new allergies or adverse reactions: No Arrival Time: 14:01 Had a fall or experienced change in No Accompanied By: self activities of daily living that may affect Transfer Assistance: None risk of falls: Patient Identification Verified: Yes Signs or symptoms of abuse/neglect since last No Secondary Verification Process Completed: Yes visito Patient Requires Transmission-Based No Hospitalized since last visit: No Precautions: Has Dressing in Place as Prescribed: Yes Patient Has Alerts: No Pain Present Now: Yes Electronic Signature(s) Signed: 05/16/2017 4:37:17 PM By: Gretta Cool, [name], RN, CWS, [name], [name] Entered By: Gretta Cool, [name], RN, CWS, Kim on 05/16/2017 14:05:47 Cotto, [name] ([id number]) -------------------------------------------------------------------------------- Clinic Level of Care Assessment Details Patient Name: [name]. Date of Service: 05/16/2017 2:00 PM Medical Record Number: [id number] Patient Account Number: [id number] Date of Birth/Sex: 1966/06/19 (51 y.o. Female) Treating RN: Cornell Barman Primary Care Steen Bisig: Delight Stare Other Clinician: Referring Drakkar Medeiros: Delight Stare Treating Eleazar Kimmey/Extender: Frann Rider in Treatment: 16 Clinic Level of Care Assessment Items TOOL 4 Quantity Score []  - Use when only an EandM is performed on FOLLOW-UP visit 0 ASSESSMENTS - Nursing Assessment / Reassessment []  -  Reassessment of Co-morbidities (includes updates in patient status) 0 X - Reassessment of Adherence to Treatment Plan 1 5 ASSESSMENTS - Wound and Skin Assessment / Reassessment []  - Simple Wound Assessment / Reassessment - one wound 0 X - Complex Wound Assessment / Reassessment - multiple wounds 2 5 []  - Dermatologic / Skin Assessment (not related to wound area) 0 ASSESSMENTS - Focused Assessment []  - Circumferential Edema Measurements - multi extremities 0 []  - Nutritional Assessment / Counseling / Intervention 0 []  - Lower Extremity Assessment (monofilament, tuning fork, pulses) 0 []  - Peripheral Arterial Disease Assessment (using hand held doppler) 0 ASSESSMENTS - Ostomy and/or Continence Assessment and Care []  - Incontinence Assessment and Management 0 []  - Ostomy Care Assessment and Management (repouching, etc.) 0 PROCESS - Coordination of Care X - Simple Patient / Family Education for ongoing care 1 15 []  - Complex (extensive) Patient / Family Education for ongoing care 0 X - Staff obtains Programmer, systems, Records, Test Results / Process Orders 1 10 []  - Staff telephones HHA, Nursing Homes / Clarify orders / etc 0 []  - Routine Transfer to another Facility (non-emergent condition) 0 Maresh, Dejuana S. ([id number]) []  - Routine Hospital Admission (non-emergent condition) 0 []  - New Admissions / Biomedical engineer / Ordering NPWT, Apligraf, etc. 0 []  - Emergency Hospital Admission (emergent condition) 0 X - Simple Discharge Coordination 1 10 []  - Complex (extensive) Discharge Coordination 0 PROCESS - Special Needs []  - Pediatric / Minor Patient Management 0 []  - Isolation Patient Management 0 []  - Hearing / Language / Visual special needs 0 []  - Assessment of Community assistance (transportation, D/C planning, etc.) 0 []  - Additional assistance / Altered mentation 0 []  - Support Surface(s) Assessment (bed, cushion, seat, etc.) 0 INTERVENTIONS - Wound Cleansing / Measurement []  -  Simple Wound Cleansing - one wound 0 X - Complex Wound Cleansing - multiple wounds 2 5 X - Wound Imaging (photographs - any  number of wounds) 1 5 []  - Wound Tracing (instead of photographs) 0 []  - Simple Wound Measurement - one wound 0 X - Complex Wound Measurement - multiple wounds 2 5 INTERVENTIONS - Wound Dressings []  - Small Wound Dressing one or multiple wounds 0 X - Medium Wound Dressing one or multiple wounds 2 15 []  - Large Wound Dressing one or multiple wounds 0 []  - Application of Medications - topical 0 []  - Application of Medications - injection 0 INTERVENTIONS - Miscellaneous []  - External ear exam 0 Paola, Lauris S. ([id number]) []  - Specimen Collection (cultures, biopsies, blood, body fluids, etc.) 0 []  - Specimen(s) / Culture(s) sent or taken to Lab for analysis 0 []  - Patient Transfer (multiple staff / Harrel Lemon Lift / Similar devices) 0 []  - Simple Staple / Suture removal (25 or less) 0 []  - Complex Staple / Suture removal (26 or more) 0 []  - Hypo / Hyperglycemic Management (close monitor of Blood Glucose) 0 []  - Ankle / Brachial Index (ABI) - do not check if billed separately 0 X - Vital Signs 1 5 Has the patient been seen at the hospital within the last three years: Yes Total Score: 110 Level Of Care: New/Established - Level 3 Electronic Signature(s) Signed: 05/16/2017 4:37:17 PM By: Gretta Cool, [name], RN, CWS, [name], [name] Entered By: Gretta Cool, [name], RN, CWS, Kim on 05/16/2017 14:39:44 Tweedy, [name] ([id number]) -------------------------------------------------------------------------------- Encounter Discharge Information Details Patient Name: [name]. Date of Service: 05/16/2017 2:00 PM Medical Record Number: [id number] Patient Account Number: [id number] Date of Birth/Sex: 05/20/1966 (51 y.o. Female) Treating RN: Cornell Barman Primary Care Maegan Buller: Delight Stare Other Clinician: Referring Anquanette Bahner: Delight Stare Treating Delayla Hoffmaster/Extender: Frann Rider in  Treatment: 16 Encounter Discharge Information Items Discharge Pain Level: 0 Discharge Condition: Stable Ambulatory Status: Ambulatory Discharge Destination: Home Transportation: Private Auto Accompanied By: self Schedule Follow-up Appointment: Yes Medication Reconciliation completed and provided to Patient/Care Yes Shaunn Tackitt: Provided on Clinical Summary of Care: 05/16/2017 Form Type Recipient Paper Patient RM Electronic Signature(s) Signed: 05/16/2017 4:37:17 PM By: Gretta Cool, [name], RN, CWS, [name], [name] Previous Signature: 05/16/2017 2:39:50 PM Version By: Ruthine Dose Entered By: Gretta Cool [name], RN, CWS, Kim on 05/16/2017 14:40:35 Lukin, [name] ([id number]) -------------------------------------------------------------------------------- Lower Extremity Assessment Details Patient Name: KRISTAIN, FILO. Date of Service: 05/16/2017 2:00 PM Medical Record Number: [id number] Patient Account Number: [id number] Date of Birth/Sex: 1966-02-24 (51 y.o. Female) Treating RN: Cornell Barman Primary Care Solveig Fangman: Delight Stare Other Clinician: Referring Denishia Citro: Delight Stare Treating Jodeci Rini/Extender: Frann Rider in Treatment: 16 Vascular Assessment Pulses: Dorsalis Pedis Palpable: [Right:Yes] Posterior Tibial Extremity colors, hair growth, and conditions: Extremity Color: [Right:Normal] Hair Growth on Extremity: [Right:No] Temperature of Extremity: [Right:Warm] Capillary Refill: [Right:< 3 seconds] Toe Nail Assessment Left: Right: Thick: Yes Discolored: Yes Deformed: Yes Improper Length and Hygiene: Yes Electronic Signature(s) Signed: 05/16/2017 4:37:17 PM By: Gretta Cool, [name], RN, CWS, [name], [name] Entered By: Gretta Cool, [name], RN, CWS, Kim on 05/16/2017 14:21:37 Kilgour, [name] ([id number]) -------------------------------------------------------------------------------- Multi Wound Chart Details Patient Name: [name] Date of Service: 05/16/2017 2:00 PM Medical Record Number:  [id number] Patient Account Number: [id number] Date of Birth/Sex: [birth year] (51 y.o. 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 Comorbid History: Anemia, Arrhythmia, Anemia, Arrhythmia, N/A Coronary Artery Disease, Coronary Artery Disease, Hypotension, Peripheral Hypotension, Peripheral Arterial Disease, Type II Arterial Disease, Type II Diabetes, Osteoarthritis Diabetes, Osteoarthritis Date Acquired: 09/23/2016 09/18/2016 N/A Weeks of Treatment: 16 16 N/A Wound Status: Open Open N/A Measurements L x W x D 1.4x1.4x0.2 3x1.5x1.8 N/A (cm) Area (cm) : 1.539 3.534 N/A Volume (cm) : 0.308 6.362 N/A % Reduction in Area: 60.80% 81.60% N/A % Reduction in Volume: 84.30% 93.40% N/A Starting Position 1 12 12  (o'clock): Ending Position 1 12 12  (o'clock): Maximum Distance 1 1.1 0.6 (cm): Undermining: Yes Yes N/A Doubleday, Roux S. ([id number]) Classification: Grade 2 Full Thickness Without N/A Exposed Support Structures Exudate Amount: Large Large N/A Exudate Type: Purulent Serous N/A Exudate Color: yellow, brown, green amber N/A Wound Margin: Distinct, outline attached Distinct, outline attached N/A Granulation Amount: Large (67-100%) Medium (34-66%) N/A Granulation Quality: Pink Red, Pink N/A Necrotic Amount: Small (1-33%) Medium (34-66%) N/A Exposed Structures: Fat Layer (Subcutaneous N/A N/A Tissue) Exposed: Yes Fascia: No Tendon: No Muscle:  No Joint: No Bone: No Epithelialization: None None N/A Periwound Skin Texture: Induration: Yes No Abnormalities Noted N/A Excoriation: No Callus: No Crepitus: No Rash: No Scarring: No Periwound Skin Maceration: Yes Maceration: Yes N/A Moisture: Dry/Scaly: No Periwound Skin Color: Atrophie Blanche: No No Abnormalities Noted N/A Cyanosis: No Ecchymosis: No Erythema: No Hemosiderin Staining: No Mottled: No Pallor: No Rubor: No Temperature: No Abnormality No Abnormality N/A Tenderness on Yes Yes N/A Palpation: Wound Preparation: Ulcer Cleansing: Ulcer Cleansing: N/A Rinsed/Irrigated with Rinsed/Irrigated with Saline Saline Topical Anesthetic Topical Anesthetic Applied: Other: lidocaine Applied: Other: lidocaine 4% 4% Treatment Notes Electronic Signature(s) Signed: 05/16/2017 2:32:24 PM By: Christin Fudge MD, FACS SHAHEEN, Roxine Chauncey Cruel ([id number]) Entered By: Christin Fudge on 05/16/2017 14:32:23 Hoyt, [name] ([id number]) -------------------------------------------------------------------------------- Payette Details Patient Name: SHERENA, MACHORRO. Date of Service: 05/16/2017 2:00 PM Medical Record Number: [id number] Patient Account Number: [id number] Date of Birth/Sex: 1966-02-24 (51 y.o. Female) Treating RN: Cornell Barman Primary Care Miner Koral: Delight Stare Other Clinician: Referring Jacqueline Spofford: Delight Stare Treating Tahisha Hakim/Extender: Frann Rider in Treatment: 16 Active Inactive ` Orientation to the Wound Care Program Nursing Diagnoses: Knowledge deficit related to the wound healing center program Goals: Patient/caregiver will verbalize understanding of the [facility] Program Date Initiated: 01/23/2017 Target Resolution Date: 05/25/2017 Goal Status: Active Interventions: Provide education on orientation to the wound center Notes: ` Peripheral Neuropathy Nursing Diagnoses: Knowledge deficit related to disease process and management  of peripheral neurovascular dysfunction Potential alteration in peripheral tissue perfusion (select prior to confirmation of diagnosis) Goals: Patient/caregiver will verbalize understanding of disease process and disease management Date Initiated: 01/23/2017 Target Resolution Date: 05/25/2017 Goal Status: Active Interventions: Assess signs and symptoms of neuropathy upon admission and as needed Provide education on Management of Neuropathy and Related Ulcers Provide education on Management of Neuropathy upon discharge from the [facility] Activities: Test ordered outside of clinic : 01/23/2017 Notes: MARYTZA, GRANDPRE ([id number]) ` Pressure Nursing Diagnoses: Knowledge deficit related to causes and risk factors for pressure ulcer development Knowledge deficit related to management of pressures ulcers Potential for impaired tissue integrity related to pressure, friction, moisture, and shear Goals: Patient will remain free from development of additional pressure ulcers Date Initiated: 01/23/2017 Target Resolution Date: 05/25/2017 Goal Status: Active Patient will remain free of pressure ulcers Date Initiated: 01/23/2017 Target Resolution Date: 05/25/2017 Goal  Status: Active Patient/caregiver will verbalize risk factors for pressure ulcer development Date Initiated: 01/23/2017 Target Resolution Date: 05/25/2017 Goal Status: Active Patient/caregiver will verbalize understanding of pressure ulcer management Date Initiated: 01/23/2017 Target Resolution Date: 05/25/2017 Goal Status: Active Interventions: Assess: immobility, friction, shearing, incontinence upon admission and as needed Assess offloading mechanisms upon admission and as needed Assess potential for pressure ulcer upon admission and as needed Provide education on pressure ulcers Treatment Activities: Patient referred for pressure reduction/relief devices : 01/23/2017 Patient referred for seating evaluation to ensure proper  offloading : 01/23/2017 Pressure reduction/relief device ordered : 01/23/2017 Notes: ` Wound/Skin Impairment Nursing Diagnoses: Impaired tissue integrity Knowledge deficit related to smoking impact on wound healing Knowledge deficit related to ulceration/compromised skin integrity Goals: Patient/caregiver will verbalize understanding of skin care regimen OREATHA, FABRY ([id number]) Date Initiated: 01/23/2017 Target Resolution Date: 05/25/2017 Goal Status: Active Ulcer/skin breakdown will have a volume reduction of 30% by week 4 Date Initiated: 01/23/2017 Target Resolution Date: 05/25/2017 Goal Status: Active Ulcer/skin breakdown will have a volume reduction of 50% by week 8 Date Initiated: 01/23/2017 Target Resolution Date: 05/25/2017 Goal Status: Active Ulcer/skin breakdown will have a volume reduction of 80% by week 12 Date Initiated: 01/23/2017 Target Resolution Date: 05/25/2017 Goal Status: Active Ulcer/skin breakdown will heal within 14 weeks Date Initiated: 01/23/2017 Target Resolution Date: 05/25/2017 Goal Status: Active Interventions: Assess patient/caregiver ability to obtain necessary supplies Assess patient/caregiver ability to perform ulcer/skin care regimen upon admission and as needed Assess ulceration(s) every visit Provide education on smoking Provide education on ulcer and skin care Treatment Activities: Patient referred to home care : 01/23/2017 Referred to DME Jannie Doyle for dressing supplies : 01/23/2017 Skin care regimen initiated : 01/23/2017 Topical wound management initiated : 01/23/2017 Notes: Electronic Signature(s) Signed: 05/16/2017 4:37:17 PM By: Gretta Cool, [name], RN, CWS, [name], [name] Entered By: Gretta Cool, [name], RN, CWS, Kim on 05/16/2017 14:23:58 Mcqueary, [name] ([id number]) -------------------------------------------------------------------------------- Pain Assessment Details Patient Name: ANIYHA, TATE. Date of Service: 05/16/2017 2:00 PM Medical Record Number:  [id number] Patient Account Number: [id number] Date of Birth/Sex: 08-23-1966 (51 y.o. Female) Treating RN: Cornell Barman Primary Care Reegan Bouffard: Delight Stare Other Clinician: Referring Cordelia Bessinger: Delight Stare Treating Nilaya Bouie/Extender: Frann Rider in Treatment: 16 Active Problems Location of Pain Severity and Description of Pain Patient Has Paino Yes Site Locations Pain Location: Pain in Ulcers Pain Management and Medication Current Pain Management: Goals for Pain Management patient states husband balled dressing up like a knot and it is hurting. Electronic Signature(s) Signed: 05/16/2017 4:37:17 PM By: Gretta Cool, [name], RN, CWS, [name], [name] Entered By: Gretta Cool, [name], RN, CWS, Kim on 05/16/2017 14:06:29 Sauber, [name] ([id number]) -------------------------------------------------------------------------------- Patient/Caregiver Education Details Patient Name: JAZMINA, MUHLENKAMP. Date of Service: 05/16/2017 2:00 PM Medical Record Number: [id number] Patient Account Number: [id number] Date of Birth/Gender: [birth year] (51 y.o. Female) Treating RN: Cornell Barman Primary Care Physician: Delight Stare Other Clinician: Referring Physician: Delight Stare Treating Physician/Extender: Frann Rider in Treatment: 16 Education Assessment Education Provided To: Patient Education Topics Provided Wound/Skin Impairment: Handouts: Caring for Your Ulcer Methods: Demonstration, Explain/Verbal Responses: State content correctly Electronic Signature(s) Signed: 05/16/2017 4:37:17 PM By: Gretta Cool, [name], RN, CWS, [name], [name] Entered By: Gretta Cool, [name], RN, CWS, Kim on 05/16/2017 14:40:48 Geier, [name] ([id number]) -------------------------------------------------------------------------------- Wound Assessment Details Patient Name: JENNFER, GASSEN. Date of Service: 05/16/2017 2:00 PM Medical Record Number: [id number] Patient Account Number: [id number] Date of Birth/Sex: 05-08-1966 (51 y.o. Female) Treating RN:  Cornell Barman Primary Care Darien Kading: Delight Stare Other Clinician:  Referring Laken Lobato: Delight Stare Treating Sabrina Keough/Extender: Frann Rider in Treatment: 16 Wound Status Wound Number: 1 Primary Diabetic Wound/Ulcer of the Lower Etiology: Extremity Wound Location: Right Malleolus - Lateral Wound Open Wounding Event: Gradually Appeared Status: Date Acquired: 09/23/2016 Comorbid Anemia, Arrhythmia, Coronary Artery Weeks Of Treatment: 16 History: Disease, Hypotension, Peripheral Clustered Wound: No Arterial Disease, Type II Diabetes, Osteoarthritis Photos Photo Uploaded By: Gretta Cool, [name], RN, CWS, Kim on 05/16/2017 14:25:03 Wound Measurements Length: (cm) 1.4 Width: (cm) 1.4 Depth: (cm) 0.2 Area: (cm) 1.539 Volume: (cm) 0.308 % Reduction in Area: 60.8% % Reduction in Volume: 84.3% Epithelialization: None Undermining: Yes Starting Position (o'clock): 12 Ending Position (o'clock): 12 Maximum Distance: (cm) 1.1 Wound Description Classification: Grade 2 Foul Odor Aft Wound Margin: Distinct, outline attached Slough/Fibrin Exudate Amount: Large Exudate Type: Purulent Exudate Color: yellow, brown, green er Cleansing: No o Yes Wound Bed Granulation Amount: Large (67-100%) Exposed Structure Granulation Quality: Pink Fascia Exposed: No Mcclain, Kasheena S. ([id number]) Necrotic Amount: Small (1-33%) Fat Layer (Subcutaneous Tissue) Exposed: Yes Necrotic Quality: Adherent Slough Tendon Exposed: No Muscle Exposed: No Joint Exposed: No Bone Exposed: No Periwound Skin Texture Texture Color No Abnormalities Noted: No No Abnormalities Noted: No Callus: No Atrophie Blanche: No Crepitus: No Cyanosis: No Excoriation: No Ecchymosis: No Induration: Yes Erythema: No Rash: No Hemosiderin Staining: No Scarring: No Mottled: No Pallor: No Moisture Rubor: No No Abnormalities Noted: No Dry / Scaly: No Temperature / Pain Maceration: Yes Temperature: No  Abnormality Tenderness on Palpation: Yes Wound Preparation Ulcer Cleansing: Rinsed/Irrigated with Saline Topical Anesthetic Applied: Other: lidocaine 4%, Treatment Notes Wound #1 (Right, Lateral Malleolus) 1. Cleansed with: Clean wound with Normal Saline 2. Anesthetic Topical Lidocaine 4% cream to wound bed prior to debridement 4. Dressing Applied: Prisma Ag 5. Secondary Dressing Applied Bordered Foam Dressing Electronic Signature(s) Signed: 05/16/2017 4:37:17 PM By: Gretta Cool, [name], RN, CWS, [name], [name] Entered By: Gretta Cool, [name], RN, CWS, Kim on 05/16/2017 14:16:17 [place], [name] ([id number]) -------------------------------------------------------------------------------- Wound Assessment Details Patient Name: LUSINE, CORLETT. Date of Service: 05/16/2017 2:00 PM Medical Record Number: [id number] Patient Account Number: [id number] Date of Birth/Sex: 1966-08-15 (51 y.o. Female) Treating RN: Cornell Barman Primary Care Django Nguyen: Delight Stare Other Clinician: Referring Clancy Leiner: Delight Stare Treating Dorinne Graeff/Extender: Frann Rider in Treatment: 16 Wound Status Wound Number: 2 Primary Open Surgical Wound Etiology: Wound Location: Right Gluteus Wound Open Wounding Event: Gradually Appeared Status: Date Acquired: 09/18/2016 Comorbid Anemia, Arrhythmia, Coronary Artery Weeks Of Treatment: 16 History: Disease, Hypotension, Peripheral Clustered Wound: No Arterial Disease, Type II Diabetes, Osteoarthritis Photos Photo Uploaded By: Gretta Cool, [name], RN, CWS, Kim on 05/16/2017 14:25:03 Wound Measurements Length: (cm) 3 Width: (cm) 1.5 Depth: (cm) 1.8 Area: (cm) 3.534 Volume: (cm) 6.362 % Reduction in Area: 81.6% % Reduction in Volume: 93.4% Epithelialization: None Tunneling: No Undermining: Yes Starting Position (o'clock): 12 Ending Position (o'clock): 12 Maximum Distance: (cm) 0.6 Wound Description Full Thickness Without Exposed Classification: Support Structures Wound  Margin: Distinct, outline attached Exudate Large Amount: Exudate Type: Serous Exudate Color: amber Andes, Mitchelle S. ([id number]) Foul Odor After Cleansing: No Slough/Fibrino No Wound Bed Granulation Amount: Medium (34-66%) Granulation Quality: Red, Pink Necrotic Amount: Medium (34-66%) Necrotic Quality: Adherent Slough Periwound Skin Texture Texture Color No Abnormalities Noted: No No Abnormalities Noted: No Moisture Temperature / Pain No Abnormalities Noted: No Temperature: No Abnormality Maceration: Yes Tenderness on Palpation: Yes Wound Preparation Ulcer Cleansing: Rinsed/Irrigated with Saline Topical Anesthetic Applied: Other: lidocaine 4%, Treatment Notes Wound #2 (Right Gluteus) 1. Cleansed with: Clean wound  with Normal Saline 2. Anesthetic Topical Lidocaine 4% cream to wound bed prior to debridement 4. Dressing Applied: Prisma Ag 5. Secondary Dressing Applied Bordered Foam Dressing Electronic Signature(s) Signed: 05/16/2017 4:37:17 PM By: Gretta Cool, [name], RN, CWS, [name], [name] Entered By: Gretta Cool, [name], RN, CWS, Kim on 05/16/2017 14:19:10 [place]per[place], [name] ([id number]) -------------------------------------------------------------------------------- Vitals Details Patient Name: DORETHA, GODING. Date of Service: 05/16/2017 2:00 PM Medical Record Number: [id number] Patient Account Number: [id number] Date of Birth/Sex: 1966-06-11 (51 y.o. Female) Treating RN: Cornell Barman Primary Care Yides Saidi: Delight Stare Other Clinician: Referring Sadiq Mccauley: Delight Stare Treating Aron Inge/Extender: Frann Rider in Treatment: 16 Vital Signs Time Taken: 14:06 Temperature (F): 98.2 Height (in): 67 Pulse (bpm): 88 Weight (lbs): 137 Respiratory Rate (breaths/min): 16 Body Mass Index (BMI): 21.5 Blood Pressure (mmHg): 64/43 Reference Range: 80 - 120 mg / dl Electronic Signature(s) Signed: 05/16/2017 4:37:17 PM By: Gretta Cool, [name], RN, CWS, [name], [name] Entered By: Gretta Cool, [name], RN, CWS,  Kim on 05/16/2017 14:09:36

## 2017-05-19 NOTE — Progress Notes (Signed)
KALYNNE, WOMAC ([id number]) Visit Report for 05/16/2017 Chief Complaint Document Details Patient Name: [name], [name]. Date of Service: 05/16/2017 2:00 PM Medical Record Number: [id number] Patient Account Number: [id number] Date of Birth/Sex: December 19, 1965 (51 y.o. Female) Treating RN: Cornell Barman Primary Care Provider: Delight Stare Other Clinician: Referring Provider: Delight Stare Treating Provider/Extender: Frann Rider in Treatment: 16 Information Obtained from: Patient Chief Complaint Patients presents for treatment of an open diabetic ulcer with an arterial etiology on her right lateral ankle and right gluteal area Electronic Signature(s) Signed: 05/16/2017 2:32:30 PM By: Christin Fudge MD, FACS Entered By: Christin Fudge on 05/16/2017 14:32:29 Solis, Duane Chauncey Cruel ([id number]) -------------------------------------------------------------------------------- HPI Details Patient Name: [name]. Date of Service: 05/16/2017 2:00 PM Medical Record Number: [id number] Patient Account Number: [id number] Date of Birth/Sex: [birth year] (51 y.o. Female) Treating RN: Cornell Barman Primary Care Provider: Delight Stare Other Clinician: Referring Provider: Delight Stare Treating Provider/Extender: Frann Rider in Treatment: 16 History of Present Illness Location: right lateral ankle and right gluteal area Quality: Patient reports experiencing a sharp pain to affected area(s). Severity: Patient states wound are getting worse. Duration: Patient has had the wound for > 4 months prior to seeking treatment at the wound center Timing: Pain in wound is constant (hurts all the time) Context: The wound occurred when the patient was in hospital with a necrotizing fasciitis of the right gluteal area and a ulceration on the right ankle Modifying Factors: Other treatment(s) tried include:admitted to the hospital for IV antibiotics and a full workup and has also had a recent angiogram Associated  Signs and Symptoms: Patient reports having increase discharge. HPI Description: 51 year old patient was sent to [place] from [facility] where she was seen by Dr. Sherril Cong for a left ankle ulceration and was recently hospitalized with hypotension and sepsis. She was treated with IV antibiotics and has been scheduled to see Dr. Sharol Given. She was seen by vascular surgery who recommended a femoral bypass but surgery has been delayed until her sacral wound from last year's necrotizing fasciitis has healed. Was seeing the wound care team at [facility] but wanted to change over. She is a smoker and smokes a pack of cigarettes a day The patient was recently admitted in [place] between February 2 and February 14. She had a follow- up to see vascular surgery, orthopedic surgery and infectious disease. 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 Review of the electronic medical records reveals that Dr. 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 An MRI showed edema in the bone which was consistent with osteomyelitis and the chronic ulcer and may need surgical intervention. She also had a sacral decubitus ulcer which was treated with the wound VAC. On 01/07/2017 she was taken up by the vascular surgeon Dr. Trula Slade for an abdominal aortogram and bilateral lower extremity runoff, for a history of having bilateral femoropopliteal bypass graft as well as external iliac stenting on the right and stenting of her bypass graft. She had developed a nonhealing wound on her right ankle and  there was a possibility of a femoral occlusion. the findings were noted and the impression  was a surgical revascularization with a aorto bifemoral bypass graft. Both the femoropopliteal bypass grafts were patent. 01/31/2017 -- x-ray of the right hip and pelvis -- IMPRESSION:No radiographic evidence of acute osteomyelitis. Normal-appearing right hip joint space for age. No acute bony abnormality of the hip. 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 Since seeing her last week she has not had an appointment with infectious disease or the orthopedic specialist yet. JOELY, LOSIER ([id number]) 02/06/2017 -- the patient missed a couple of appointments yesterday due to the weather but other than that has apparently given up smoking for the last 5 days. 02/13/2017 -- she has rescheduled her infectious disease appointment and also the appointment with the orthopedic surgeon Dr. Sharol Given 03/06/2017 -- she was seen by Dr. Lucianne Lei dam of infectious disease on 03/05/2017 and he recommendedIV antibiotics but the patient did not want to have that and he has given her Augmentin and doxycycline and will reevaluate her in 2 months time. 03/13/2017 -- he saw Dr. Trula Slade regarding her vascular issues and he would like to wait to the sacral wound is completely closed before considering aortobifemoral bypass graft. He will see her back in 2 months time. She was also seen by Dr. Sharol Given of orthopedics who recommended continue wound care dressings and follow in the office in about 2 months time. he also recommended 3 view radiographs of the right ankle at follow-up. 03/28/2017 -- she did have a PICC line placed and Dr. Lucianne Lei dam has begun IV vancomycin and ceftriaxone. she is going to continue this until she sees him in approximately a month's time 04/18/2017 -- we had applied for a skin substitute for the patient's care but her copayment is going to be about $300 a  piece and the patient will not be able to afford this. 05/08/17 on evaluation today patient appears to potentially have a fungal rash in the periwound region of the sacral area. This also seems to extend into the inguinal creases and factional region as well. She is tender to palpation with light rubbing over the region of the periwound and the skin in this region does have a beefy red appearance. Everything seems to point to a fungal infection at this time. She has been tolerating the dressing changes up to this point. There is no evidence of infection otherwise. 05/16/2017 -- was seen by Dr. 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 He did not think that she is making progress and was not able to examine her sacral decubitus ulcer as the patient would not allow it. She was switched to Bactrim DS 2 tablets twice a day since finishing her antibiotics. Her ESR was 76 and a CRP was 2.8. 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 Electronic Signature(s) Signed: 05/16/2017 2:33:00 PM By: Christin Fudge MD, FACS Previous Signature: 05/16/2017 2:21:43 PM Version By: Christin Fudge MD, FACS Entered By: Christin Fudge on 05/16/2017 14:33:00 Lanter, [name] ([id number]) -------------------------------------------------------------------------------- Physical Exam Details Patient Name: [name], [name] Date of Service: 05/16/2017 2:00 PM Medical Record Number: [id number] Patient Account Number: [id number] Date of Birth/Sex: 1966-05-05 (51 y.o. Female) Treating RN: Cornell Barman Primary Care Provider: Delight Stare Other Clinician: Referring Provider: Delight Stare Treating Provider/Extender: Frann Rider in Treatment: 16 Constitutional .  Pulse regular. Respirations normal and unlabored. Afebrile.  . Eyes Nonicteric. Reactive to light. Ears, Nose, Mouth, and Throat Lips, teeth, and gums WNL.Marland Kitchen Moist mucosa without lesions. Neck supple and nontender. No palpable supraclavicular or cervical adenopathy. Normal sized without goiter. Respiratory WNL. No retractions.. Breath sounds WNL, No rubs, rales, rhonchi, or wheeze.. Cardiovascular Heart rhythm and rate regular, no murmur or gallop.. Pedal Pulses WNL. No clubbing, cyanosis or edema. Chest Breasts symmetical and no nipple discharge.. Breast tissue WNL, no masses, lumps, or tenderness.. Lymphatic No adneopathy. No adenopathy. No adenopathy. Musculoskeletal Adexa without tenderness or enlargement.. Digits and nails w/o clubbing, cyanosis, infection, petechiae, ischemia, or inflammatory conditions.. Integumentary (Hair, Skin) No suspicious lesions. No crepitus or fluctuance. No peri-wound warmth or erythema. No masses.Marland Kitchen Psychiatric Judgement and insight Intact.. No evidence of depression, anxiety, or agitation.. Notes the wound on the right lateral ankle is looking excellent with healthy granulation tissue and we will continue with local care with silver collagen dressings on alternate days. The gluteal wound base is very clean and the surrounding skin does not show any evidence of fungal infection. She is ready to go back to application of Prisma under the black foam and using the wound VAC and change 3 times a week Electronic Signature(s) Signed: 05/16/2017 2:34:11 PM By: Christin Fudge MD, FACS Entered By: Christin Fudge on 05/16/2017 14:34:11 Graumann, [name] ([id number]) -------------------------------------------------------------------------------- Physician Orders Details Patient Name: [name]. Date of Service: 05/16/2017 2:00 PM Medical Record Number: [id number] Patient Account Number: [id number] Date of Birth/Sex: [birth year] (51 y.o. Female) Treating RN: Cornell Barman Primary Care Provider: Delight Stare Other  Clinician: Referring Provider: Delight Stare Treating Provider/Extender: Frann Rider in Treatment: 16 Verbal / Phone Orders: No Diagnosis Coding Wound Cleansing Wound #1 Right,Lateral Malleolus o Clean wound with Normal Saline. Wound #2 Right Gluteus o Clean wound with Normal Saline. Anesthetic Wound #1 Right,Lateral Malleolus o Topical Lidocaine 4% cream applied to wound bed prior to debridement Wound #2 Right Gluteus o Topical Lidocaine 4% cream applied to wound bed prior to debridement Primary Wound Dressing Wound #1 Right,Lateral Malleolus o Prisma Ag Wound #2 Right Gluteus o Aquacel Ag - In clinic o Prisma Ag - or equal under wound vac. Secondary Dressing Wound #1 Right,Lateral Malleolus o Boardered Foam Dressing Wound #2 Right Gluteus o Boardered Foam Dressing Dressing Change Frequency Wound #1 Right,Lateral Malleolus o Change Dressing Monday, Wednesday, Friday Wound #2 Right Gluteus o Change Dressing Monday, Wednesday, Friday ZOELLA, ROBERTI. ([id number]) Follow-up Appointments Wound #1 Right,Lateral Malleolus o Return Appointment in 1 week. Wound #2 Right Gluteus o Return Appointment in 1 week. Home Health Wound #1 [place] Visits - Monday, Wednesday, Friday o Home Health Nurse may visit PRN to address patientos wound care needs. o FACE TO FACE ENCOUNTER: MEDICARE and MEDICAID PATIENTS: I certify that this patient is under my care and that I had a face-to-face encounter that meets the physician face-to-face encounter requirements with this patient on this date. The encounter with the patient was in whole or in part for the following MEDICAL CONDITION: (primary reason for [place]) MEDICAL NECESSITY: I certify, that based on my findings, NURSING services are a medically necessary home health service. HOME BOUND STATUS: I certify that my clinical findings support that this patient  is homebound (i.e., Due to illness or injury, pt requires aid of supportive devices such as crutches, cane, wheelchairs, walkers, the use of special transportation or the assistance of another person to  leave their place of residence. There is a normal inability to leave the home and doing so requires considerable and taxing effort. Other absences are for medical reasons / religious services and are infrequent or of short duration when for other reasons). o If current dressing causes regression in wound condition, may D/C ordered dressing product/s and apply Normal Saline Moist Dressing daily until next [place] / Other MD appointment. [place] of regression in wound condition at [phone]. o Please direct any NON-WOUND related issues/requests for orders to patient's Primary Care Physician Wound #2 Right [place] Visits - Monday, Wednesday, Friday o Home Health Nurse may visit PRN to address patientos wound care needs. o FACE TO FACE ENCOUNTER: MEDICARE and MEDICAID PATIENTS: I certify that this patient is under my care and that I had a face-to-face encounter that meets the physician face-to-face encounter requirements with this patient on this date. The encounter with the patient was in whole or in part for the following MEDICAL CONDITION: (primary reason for [place]) MEDICAL NECESSITY: I certify, that based on my findings, NURSING services are a medically necessary home health service. HOME BOUND STATUS: I certify that my clinical findings support that this patient is homebound (i.e., Due to illness or injury, pt requires aid of supportive devices such as crutches, cane, wheelchairs, walkers, the use of special transportation or the assistance of another person to leave their place of residence. There is a normal inability to leave the home and doing so requires considerable and taxing effort. Other absences are for  medical reasons / religious services and are infrequent or of short duration when for other reasons). o If current dressing causes regression in wound condition, may D/C ordered dressing product/s and apply Normal Saline Moist Dressing daily until next [place] / Other MD appointment. [place] of regression in wound condition at [phone]. o Please direct any NON-WOUND related issues/requests for orders to patient's Primary Care Physician KAILEA, DANNEMILLER ([id number]) Negative Pressure Wound Therapy Wound #2 Right Gluteus o Wound VAC settings at 125/130 mmHg continuous pressure. Use BLACK/GREEN foam to wound cavity. Use WHITE foam to fill any tunnel/s and/or undermining. Change VAC dressing 2 X WEEK. Change canister as indicated when full. Nurse may titrate settings and frequency of dressing changes as clinically indicated. - Collegen dressing under wound [place] Nurse may d/c VAC for s/s of increased infection, significant wound regression, or uncontrolled drainage. [place] at [phone]. o Apply contact layer over base of wound. o Number of foam/gauze pieces used in the dressing = o Place NPWT on HOLD. o Other: - Re-apply NPWT beginning Monday July 2. Electronic Signature(s) Signed: 05/16/2017 4:14:17 PM By: Christin Fudge MD, FACS Signed: 05/16/2017 4:37:17 PM By: Gretta Cool, [name], RN, CWS, [name], [name] Entered By: Gretta Cool, [name], RN, CWS, Kim on 05/16/2017 14:37:45 Gonnella, [name] ([id number]) -------------------------------------------------------------------------------- Problem List Details Patient Name: [name], [name]. Date of Service: 05/16/2017 2:00 PM Medical Record Number: [id number] Patient Account Number: [id number] Date of Birth/Sex: 12/03/1965 (51 y.o. Female) Treating RN: Cornell Barman Primary Care Provider: Delight Stare Other Clinician: Referring Provider: Delight Stare Treating Provider/Extender:  Frann Rider in Treatment: 16 Active Problems ICD-10 Encounter Code Description Active Date Diagnosis E11.621 Type 2 diabetes mellitus with foot ulcer 01/23/2017 Yes L89.314 Pressure ulcer of right buttock, stage 4 01/23/2017 Yes L97.312 Non-pressure chronic ulcer of right ankle with fat layer 01/23/2017 Yes exposed F17.218 Nicotine  dependence, cigarettes, with other nicotine- 01/23/2017 Yes induced disorders I70.233 Atherosclerosis of native arteries of right leg with 01/23/2017 Yes ulceration of ankle M86.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 Date of Service: 05/16/2017 2:00 PM Medical Record Number: [id number] Patient Account Number: [id number] Date of Birth/Sex: [birth year] (51 y.o. Female) Treating RN: Cornell Barman Primary Care Provider: Delight Stare Other Clinician: Referring Provider: Delight Stare Treating Provider/Extender: Frann Rider in Treatment: 16 Subjective Chief Complaint Information obtained from Patient Patients presents for treatment of an open diabetic ulcer with an arterial etiology on her right lateral ankle and right gluteal area History of Present Illness (HPI) The following HPI elements were documented for the patient's wound: Location: right lateral ankle and right gluteal area Quality: Patient reports experiencing a sharp pain to affected area(s). Severity: Patient states wound are getting worse. Duration: Patient has had the wound for > 4 months prior to seeking treatment at the wound center Timing: Pain in wound is constant (hurts all the time) Context: The wound occurred when the patient  was in hospital with a necrotizing fasciitis of the right gluteal area and a ulceration on the right ankle Modifying Factors: Other treatment(s) tried include:admitted to the hospital for IV antibiotics and a full workup and has also had a recent angiogram Associated Signs and Symptoms: Patient reports having increase discharge. 51 year old patient was sent to [place] from [facility] where she was seen by Dr. Sherril Cong for a left ankle ulceration and was recently hospitalized with hypotension and sepsis. She was treated with IV antibiotics and has been scheduled to see Dr. Sharol Given. She was seen by vascular surgery who recommended a femoral bypass but surgery has been delayed until her sacral wound from last year's necrotizing fasciitis has healed. Was seeing the wound care team at [facility] but wanted to change over. She is a smoker and smokes a pack of cigarettes a day The patient was recently admitted in [place] between February 2 and February 14. She had a follow- up to see vascular surgery, orthopedic surgery and infectious disease. 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 Review of the electronic medical records reveals that Dr. 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 An MRI showed edema in the bone which was consistent with osteomyelitis and the chronic ulcer and may need surgical intervention. She also had a sacral decubitus ulcer which was treated with the wound VAC. On 01/07/2017 she was taken up by the vascular surgeon Dr. Trula Slade for an abdominal  aortogram and bilateral lower extremity runoff, for a history of having bilateral femoropopliteal bypass graft as well as external iliac stenting on the right and stenting of her bypass graft. She had developed a nonhealing wound on her right ankle and there was a possibility of a femoral occlusion. the findings were noted and the ZOEJANE, GAULIN. ([id number]) impression was a surgical revascularization with a aorto bifemoral bypass  graft. Both the femoropopliteal bypass grafts were patent. 01/31/2017 -- x-ray of the right hip and pelvis -- IMPRESSION:No radiographic evidence of acute osteomyelitis. Normal-appearing right hip joint space for age. No acute bony abnormality of the hip. 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 Since seeing her last week she has not had an appointment with infectious disease or the orthopedic specialist yet. 02/06/2017 -- the patient missed a couple of appointments yesterday due to the weather but other than that has apparently given up smoking for the last 5 days. 02/13/2017 -- she has rescheduled her infectious disease appointment and also the appointment with the orthopedic surgeon Dr. Sharol Given 03/06/2017 -- she was seen by Dr. Lucianne Lei dam of infectious disease on 03/05/2017 and he recommendedIV antibiotics but the patient did not want to have that and he has given her Augmentin and doxycycline and will reevaluate her in 2 months time. 03/13/2017 -- he saw Dr. Trula Slade regarding her vascular issues and he would like to wait to the sacral wound is completely closed before considering aortobifemoral bypass graft. He will see her back in 2 months time. She was also seen by Dr. Sharol Given of orthopedics who recommended continue wound care dressings and follow in the office in about 2 months time. he also recommended 3 view radiographs of the right ankle at follow-up. 03/28/2017 -- she did have a PICC line placed and  Dr. Lucianne Lei dam has begun IV vancomycin and ceftriaxone. she is going to continue this until she sees him in approximately a month's time 04/18/2017 -- we had applied for a skin substitute for the patient's care but her copayment is going to be about $300 a piece and the patient will not be able to afford this. 05/08/17 on evaluation today patient appears to potentially have a fungal rash in the periwound region of the sacral area. This also seems to extend into the inguinal creases and factional region as well. She is tender to palpation with light rubbing over the region of the periwound and the skin in this region does have a beefy red appearance. Everything seems to point to a fungal infection at this time. She has been tolerating the dressing changes up to this point. There is no evidence of infection otherwise. 05/16/2017 -- was seen by Dr. 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 He did not think that she is making progress and was not able to examine her sacral decubitus ulcer as the patient would not allow it. She was switched to Bactrim DS 2 tablets twice a day since finishing her antibiotics. Her ESR was 76 and a CRP was 2.8. 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 Objective Constitutional Harting, Sonnie S. ([id number]) Pulse regular. Respirations normal and unlabored. Afebrile. Vitals Time Taken: 2:06 PM, Height: 67 in, Weight: 137 lbs, BMI: 21.5, Temperature: 98.2 F, Pulse: 88 bpm, Respiratory Rate: 16 breaths/min, Blood Pressure: 64/43 mmHg. Eyes Nonicteric. Reactive to light. Ears, Nose, Mouth, and Throat Lips, teeth, and gums WNL.Marland Kitchen Moist mucosa without lesions. Neck supple and nontender. No palpable supraclavicular or cervical adenopathy. Normal sized without goiter. Respiratory WNL. No  retractions.. Breath sounds WNL, No rubs, rales, rhonchi, or wheeze.. Cardiovascular Heart rhythm and rate regular, no murmur or gallop.. Pedal Pulses WNL.  No clubbing, cyanosis or edema. Chest Breasts symmetical and no nipple discharge.. Breast tissue WNL, no masses, lumps, or tenderness.. Lymphatic No adneopathy. No adenopathy. No adenopathy. Musculoskeletal Adexa without tenderness or enlargement.. Digits and nails w/o clubbing, cyanosis, infection, petechiae, ischemia, or inflammatory conditions.Marland Kitchen Psychiatric Judgement and insight Intact.. No evidence of depression, anxiety, or agitation.. General Notes: the wound on the right lateral ankle is looking excellent with healthy granulation tissue and we will continue with local care with silver collagen dressings on alternate days. The gluteal wound base is very clean and the surrounding skin does not show any evidence of fungal infection. She is ready to go back to application of Prisma under the black foam and using the wound VAC and change 3 times a week Integumentary (Hair, Skin) No suspicious lesions. No crepitus or fluctuance. No peri-wound warmth or erythema. No masses.. Wound #1 status is Open. Original cause of wound was Gradually Appeared. The wound is located on the Right,Lateral Malleolus. The wound measures 1.4cm length x 1.4cm width x 0.2cm depth; 1.539cm^2 area and 0.308cm^3 volume. There is Fat Layer (Subcutaneous Tissue) Exposed exposed. There is undermining starting at 12:00 and ending at 12:00 with a maximum distance of 1.1cm. There is a large amount of purulent drainage noted. The wound margin is distinct with the outline attached to the wound base. There is large (67-100%) pink granulation within the wound bed. There is a small (1-33%) amount of necrotic tissue within the wound bed including Adherent Slough. The periwound skin appearance exhibited: Induration, Maceration. The periwound skin appearance did not exhibit:  Callus, Crepitus, Excoriation, Rash, [place], Thelia S. ([id number]) Scarring, Dry/Scaly, Atrophie Blanche, Cyanosis, Ecchymosis, Hemosiderin Staining, Mottled, Pallor, Rubor, Erythema. Periwound temperature was noted as No Abnormality. The periwound has tenderness on palpation. Wound #2 status is Open. Original cause of wound was Gradually Appeared. The wound is located on the Right Gluteus. The wound measures 3cm length x 1.5cm width x 1.8cm depth; 3.534cm^2 area and 6.362cm^3 volume. There is no tunneling noted, however, there is undermining starting at 12:00 and ending at 12:00 with a maximum distance of 0.6cm. There is a large amount of serous drainage noted. The wound margin is distinct with the outline attached to the wound base. There is medium (34-66%) red, pink granulation within the wound bed. There is a medium (34-66%) amount of necrotic tissue within the wound bed including Adherent Slough. The periwound skin appearance exhibited: Maceration. Periwound temperature was noted as No Abnormality. The periwound has tenderness on palpation. 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 Wound #2 Right Gluteus: Clean wound with Normal Saline. Anesthetic: Wound #1 Right,Lateral Malleolus: Topical Lidocaine 4% cream applied to wound bed prior to debridement Wound #2 Right Gluteus: Topical Lidocaine 4% cream applied to wound bed prior to debridement Primary Wound Dressing: Wound #1 Right,Lateral  Malleolus: Prisma Ag Wound #2 Right Gluteus: Aquacel Ag - In clinic Carraway, Troi S. ([id number]) Prisma Ag - or equal under wound vac. Secondary Dressing: Wound #1 Right,Lateral Malleolus: Boardered Foam Dressing Wound #2 Right Gluteus: Boardered Foam Dressing Dressing Change Frequency: Wound #1 Right,Lateral  Malleolus: Change Dressing Monday, Wednesday, Friday Wound #2 Right Gluteus: Change Dressing Monday, Wednesday, Friday Follow-up Appointments: Wound #1 Right,Lateral Malleolus: Return Appointment in 1 week. Wound #2 Right Gluteus: Return Appointment in 1 week. Home Health: Wound #1 Right,Lateral Malleolus: [place] Visits - Monday, Wednesday, Friday [place] Nurse may visit PRN to address patient s wound care needs. FACE TO FACE ENCOUNTER: MEDICARE and MEDICAID PATIENTS: I certify that this patient is under my care and that I had a face-to-face encounter that meets the physician face-to-face encounter requirements with this patient on this date. The encounter with the patient was in whole or in part for the following MEDICAL CONDITION: (primary reason for [place]) MEDICAL NECESSITY: I certify, that based on my findings, NURSING services are a medically necessary home health service. HOME BOUND STATUS: I certify that my clinical findings support that this patient is homebound (i.e., Due to illness or injury, pt requires aid of supportive devices such as crutches, cane, wheelchairs, walkers, the use of special transportation or the assistance of another person to leave their place of residence. There is a normal inability to leave the home and doing so requires considerable and taxing effort. Other absences are for medical reasons / religious services and are infrequent or of short duration when for other reasons). If current dressing causes regression in wound condition, may D/C ordered dressing product/s and apply Normal Saline Moist Dressing daily  until next [place] / Other MD appointment. [place] of regression in wound condition at [phone]. Please direct any NON-WOUND related issues/requests for orders to patient's Primary Care Physician Wound #2 Right Gluteus: [place] Visits - Monday, Wednesday, Friday Home Health Nurse may visit PRN to address patient s wound care needs. FACE TO FACE ENCOUNTER: MEDICARE and MEDICAID PATIENTS: I certify that this patient is under my care and that I had a face-to-face encounter that meets the physician face-to-face encounter requirements with this patient on this date. The encounter with the patient was in whole or in part for the following MEDICAL CONDITION: (primary reason for [place]) MEDICAL NECESSITY: I certify, that based on my findings, NURSING services are a medically necessary home health service. HOME BOUND STATUS: I certify that my clinical findings support that this patient is homebound (i.e., Due to illness or injury, pt requires aid of supportive devices such as crutches, cane, wheelchairs, walkers, the use of special transportation or the assistance of another person to leave their place of residence. There is a normal inability to leave the home and doing so requires considerable and taxing effort. Other absences are for medical reasons / religious services and are infrequent or of short duration when for other reasons). If current dressing causes regression in wound condition, may D/C ordered dressing product/s and apply Normal Saline Moist Dressing daily until next [place] / Other MD appointment. [place] of regression in wound condition at [phone]. Please direct any NON-WOUND related issues/requests for orders to patient's Primary Care Physician LAKISA, LOTZ ([id number]) Negative Pressure Wound Therapy: Wound #2 Right Gluteus: Wound VAC settings at 125/130 mmHg continuous pressure. Use  BLACK/GREEN foam to wound cavity. Use WHITE foam to fill any tunnel/s and/or undermining. Change VAC dressing 2 X WEEK. Change canister as indicated when full. Nurse may titrate settings and frequency of dressing changes as clinically indicated. - Collegen dressing under wound [place] Nurse may d/c VAC for s/s of increased infection, significant wound regression,  or uncontrolled drainage. [place] at [phone]. Apply contact layer over base of wound. Number of foam/gauze pieces used in the dressing = Place NPWT on HOLD. Other: - Re-apply NPWT beginning Monday July 2. She was seen by infectious disease and vascular surgery in the past week and the patient is going to be on oral antibiotics and vascular surgery is going to be postponed until her wound heals and after review today I have recommended: 1. Silver collagen to be applied to the right ankle, change daily 2. Silver collagen under the black foam for the wound VAC, to the right gluteal region to be changed 3 times a week. 3. adequate protein, vitamin A, vitamin C and zinc Electronic Signature(s) Signed: 05/16/2017 4:19:36 PM By: Christin Fudge MD, FACS Previous Signature: 05/16/2017 4:18:39 PM Version By: Christin Fudge MD, FACS Previous Signature: 05/16/2017 2:36:16 PM Version By: Christin Fudge MD, FACS Entered By: Christin Fudge on 05/16/2017 16:19:36 [place]vier, [name] ([id number]) -------------------------------------------------------------------------------- SuperBill Details Patient Name: [name], [name] Date of Service: 05/16/2017 Medical Record Number: [id number] Patient Account Number: [id number] Date of Birth/Sex: 1966-10-25 (51 y.o. 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

## 2017-05-23 ENCOUNTER — Encounter: Payer: Medicare Other | Attending: Physician Assistant | Admitting: Physician Assistant

## 2017-05-23 DIAGNOSIS — D649 Anemia, unspecified: Secondary | ICD-10-CM | POA: Insufficient documentation

## 2017-05-23 DIAGNOSIS — F17218 Nicotine dependence, cigarettes, with other nicotine-induced disorders: Secondary | ICD-10-CM | POA: Diagnosis not present

## 2017-05-23 DIAGNOSIS — Z880 Allergy status to penicillin: Secondary | ICD-10-CM | POA: Insufficient documentation

## 2017-05-23 DIAGNOSIS — I70233 Atherosclerosis of native arteries of right leg with ulceration of ankle: Secondary | ICD-10-CM | POA: Diagnosis not present

## 2017-05-23 DIAGNOSIS — L89314 Pressure ulcer of right buttock, stage 4: Secondary | ICD-10-CM | POA: Insufficient documentation

## 2017-05-23 DIAGNOSIS — L97312 Non-pressure chronic ulcer of right ankle with fat layer exposed: Secondary | ICD-10-CM | POA: Diagnosis not present

## 2017-05-23 DIAGNOSIS — E1151 Type 2 diabetes mellitus with diabetic peripheral angiopathy without gangrene: Secondary | ICD-10-CM | POA: Diagnosis not present

## 2017-05-23 DIAGNOSIS — M86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, right ankle and foot: Secondary | ICD-10-CM | POA: Diagnosis not present

## 2017-05-23 DIAGNOSIS — E11621 Type 2 diabetes mellitus with foot ulcer: Secondary | ICD-10-CM | POA: Diagnosis not present

## 2017-05-23 DIAGNOSIS — I2510 Atherosclerotic heart disease of native coronary artery without angina pectoris: Secondary | ICD-10-CM | POA: Diagnosis not present

## 2017-05-23 DIAGNOSIS — M1990 Unspecified osteoarthritis, unspecified site: Secondary | ICD-10-CM | POA: Insufficient documentation

## 2017-05-24 NOTE — Progress Notes (Signed)
JHANAE, JASKOWIAK ([id number]) Visit Report for 05/23/2017 Arrival Information Details Patient Name: [name], [name]. Date of Service: 05/23/2017 2:30 PM Medical Record Number: [id number] Patient Account Number: [id number] Date of Birth/Sex: [birth year] (51 y.o. Female) Treating RN: Cornell Barman Primary Care Ashanna Heinsohn: Delight Stare Other Clinician: Referring Elliot Simoneaux: Delight Stare Treating Eldrick Penick/Extender: Melburn Hake, HOYT Weeks in Treatment: 44 Visit Information History Since Last Visit Added or deleted any medications: No Patient Arrived: Walker Any new allergies or adverse reactions: No Arrival Time: 14:23 Had a fall or experienced change in No Accompanied By: self activities of daily living that may affect Transfer Assistance: None risk of falls: Patient Identification Verified: Yes Signs or symptoms of abuse/neglect since last No Secondary Verification Process Completed: Yes visito Patient Requires Transmission-Based No Hospitalized since last visit: No Precautions: Has Dressing in Place as Prescribed: Yes Patient Has Alerts: No Has Compression in Place as Prescribed: Yes Pain Present Now: No Electronic Signature(s) Signed: 05/23/2017 4:43:28 PM By: Gretta Cool, [name], RN, CWS, [name], [name] Entered By: Gretta Cool, [name], RN, CWS, Kim on 05/23/2017 14:23:57 Henken, [name] ([id number]) -------------------------------------------------------------------------------- Clinic Level of Care Assessment Details Patient Name: [name], [name]. Date of Service: 05/23/2017 2:30 PM Medical Record Number: [id number] Patient Account Number: [id number] Date of Birth/Sex: 22-Sep-1966 (51 y.o. Female) Treating RN: Cornell Barman Primary Care Hadasa Gasner: Delight Stare Other Clinician: Referring Laporshia Hogen: Delight Stare Treating Kiylah Loyer/Extender: Melburn Hake, HOYT Weeks in Treatment: 17 Clinic Level of Care Assessment Items TOOL 4 Quantity Score []  - Use when only an EandM is performed on FOLLOW-UP visit 0 ASSESSMENTS -  Nursing Assessment / Reassessment []  - Reassessment of Co-morbidities (includes updates in patient status) 0 X - Reassessment of Adherence to Treatment Plan 1 5 ASSESSMENTS - Wound and Skin Assessment / Reassessment []  - Simple Wound Assessment / Reassessment - one wound 0 X - Complex Wound Assessment / Reassessment - multiple wounds 2 5 []  - Dermatologic / Skin Assessment (not related to wound area) 0 ASSESSMENTS - Focused Assessment []  - Circumferential Edema Measurements - multi extremities 0 []  - Nutritional Assessment / Counseling / Intervention 0 []  - Lower Extremity Assessment (monofilament, tuning fork, pulses) 0 []  - Peripheral Arterial Disease Assessment (using hand held doppler) 0 ASSESSMENTS - Ostomy and/or Continence Assessment and Care []  - Incontinence Assessment and Management 0 []  - Ostomy Care Assessment and Management (repouching, etc.) 0 PROCESS - Coordination of Care X - Simple Patient / Family Education for ongoing care 1 15 []  - Complex (extensive) Patient / Family Education for ongoing care 0 []  - Staff obtains Programmer, systems, Records, Test Results / Process Orders 0 []  - Staff telephones HHA, Nursing Homes / Clarify orders / etc 0 []  - Routine Transfer to another Facility (non-emergent condition) 0 Cicio, Tamesha S. ([id number]) []  - Routine Hospital Admission (non-emergent condition) 0 []  - New Admissions / Biomedical engineer / Ordering NPWT, Apligraf, etc. 0 []  - Emergency Hospital Admission (emergent condition) 0 X - Simple Discharge Coordination 1 10 []  - Complex (extensive) Discharge Coordination 0 PROCESS - Special Needs []  - Pediatric / Minor Patient Management 0 []  - Isolation Patient Management 0 []  - Hearing / Language / Visual special needs 0 []  - Assessment of Community assistance (transportation, D/C planning, etc.) 0 []  - Additional assistance / Altered mentation 0 []  - Support Surface(s) Assessment (bed, cushion, seat, etc.) 0 INTERVENTIONS -  Wound Cleansing / Measurement []  - Simple Wound Cleansing - one wound 0 X - Complex Wound Cleansing - multiple wounds 2  5 X - Wound Imaging (photographs - any number of wounds) 1 5 []  - Wound Tracing (instead of photographs) 0 []  - Simple Wound Measurement - one wound 0 X - Complex Wound Measurement - multiple wounds 2 5 INTERVENTIONS - Wound Dressings []  - Small Wound Dressing one or multiple wounds 0 X - Medium Wound Dressing one or multiple wounds 2 15 []  - Large Wound Dressing one or multiple wounds 0 []  - Application of Medications - topical 0 []  - Application of Medications - injection 0 INTERVENTIONS - Miscellaneous []  - External ear exam 0 Zacharia, [name] ([id number]) []  - Specimen Collection (cultures, biopsies, blood, body fluids, etc.) 0 []  - Specimen(s) / Culture(s) sent or taken to Lab for analysis 0 []  - Patient Transfer (multiple staff / Harrel Lemon Lift / Similar devices) 0 []  - Simple Staple / Suture removal (25 or less) 0 []  - Complex Staple / Suture removal (26 or more) 0 []  - Hypo / Hyperglycemic Management (close monitor of Blood Glucose) 0 []  - Ankle / Brachial Index (ABI) - do not check if billed separately 0 X - Vital Signs 1 5 Has the patient been seen at the hospital within the last three years: Yes Total Score: 100 Level Of Care: New/Established - Level 3 Electronic Signature(s) Signed: 05/23/2017 4:43:28 PM By: Gretta Cool, [name], RN, CWS, [name], [name] Entered By: Gretta Cool, [name], RN, CWS, Kim on 05/23/2017 14:46:41 Parzych, [name] ([id number]) -------------------------------------------------------------------------------- Encounter Discharge Information Details Patient Name: [name], [name]. Date of Service: 05/23/2017 2:30 PM Medical Record Number: [id number] Patient Account Number: [id number] Date of Birth/Sex: November 17, 1966 (51 y.o. Female) Treating RN: Cornell Barman Primary Care Shaketta Rill: Delight Stare Other Clinician: Referring Danaria Larsen: Delight Stare Treating  Anees Vanecek/Extender: Melburn Hake, HOYT Weeks in Treatment: 17 Encounter Discharge Information Items Discharge Pain Level: 0 Discharge Condition: Stable Ambulatory Status: Walker Discharge Destination: Home Transportation: Private Auto Accompanied By: self Schedule Follow-up Appointment: Yes Medication Reconciliation completed Yes and provided to Patient/Care Sybil Shrader: Provided on Clinical Summary of Care: 05/23/2017 Form Type Recipient Paper Patient RM Electronic Signature(s) Signed: 05/23/2017 4:43:28 PM By: Gretta Cool, [name], RN, CWS, [name], [name] Previous Signature: 05/23/2017 2:47:27 PM Version By: Ruthine Dose Entered By: Gretta Cool [name], RN, CWS, Kim on 05/23/2017 14:51:39 Vanderwoude, [name] ([id number]) -------------------------------------------------------------------------------- Lower Extremity Assessment Details Patient Name: [name], [name]. Date of Service: 05/23/2017 2:30 PM Medical Record Number: [id number] Patient Account Number: [id number] Date of Birth/Sex: 1966/01/10 (51 y.o. Female) Treating RN: Cornell Barman Primary Care Brailyn Delman: Delight Stare Other Clinician: Referring Khamiya Varin: Delight Stare Treating Sarp Vernier/Extender: Melburn Hake, HOYT Weeks in Treatment: 17 Vascular Assessment Pulses: Dorsalis Pedis Palpable: [Right:Yes] Posterior Tibial Extremity colors, hair growth, and conditions: Extremity Color: [Right:Normal] Hair Growth on Extremity: [Right:No] Temperature of Extremity: [Right:Warm] Capillary Refill: [Right:< 3 seconds] Toe Nail Assessment Left: Right: Thick: Yes Discolored: Yes Deformed: Yes Improper Length and Hygiene: Yes Electronic Signature(s) Signed: 05/23/2017 4:43:28 PM By: Gretta Cool, [name], RN, CWS, [name], [name] Entered By: Gretta Cool, [name], RN, CWS, Kim on 05/23/2017 14:32:05 Turman, [name] ([id number]) -------------------------------------------------------------------------------- Multi Wound Chart Details Patient Name: Jamie Martyr. Date of Service: 05/23/2017  2:30 PM Medical Record Number: [id number] Patient Account Number: [id number] Date of Birth/Sex: 1966-07-28 (51 y.o. 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 Comorbid History: Anemia, Arrhythmia, Anemia, Arrhythmia, N/A Coronary Artery Disease, Coronary Artery Disease, Hypotension, Peripheral Hypotension, Peripheral Arterial Disease, Type II Arterial Disease, Type II Diabetes, Osteoarthritis Diabetes, Osteoarthritis Date Acquired: 09/23/2016 09/18/2016 N/A Weeks of Treatment: 17 17 N/A Wound Status: Open Open N/A Measurements L x W x D 1.1x1.2x0.2 2x1.5x1.8 N/A (cm) Area (cm) : 1.037 2.356 N/A Volume (cm) : 0.207 4.241 N/A % Reduction in Area: 73.60% 87.70% N/A % Reduction in Volume: 89.50% 95.60% N/A Starting Position 1 2 (o'clock): Ending Position 1 9 (o'clock): Maximum Distance 1 0.6 (cm): Undermining: No Yes N/A Myer, [name] ([id number]) Classification: Grade 2 Full Thickness Without N/A Exposed Support Structures Exudate Amount: Large Large N/A Exudate Type: Purulent Serous N/A Exudate Color: yellow, brown, green amber N/A Wound Margin: Distinct, outline attached Distinct, outline attached N/A Granulation Amount: Large (67-100%) Medium (34-66%) N/A Granulation Quality: Pink Red, Pink N/A Necrotic Amount: Small (1-33%) Medium (34-66%) N/A Exposed Structures: Fat Layer (Subcutaneous Fat Layer (Subcutaneous N/A Tissue) Exposed:  Yes Tissue) Exposed: Yes Fascia: No Fascia: No Tendon: No Tendon: No Muscle: No Muscle: No Joint: No Joint: No Bone: No Bone: No Epithelialization: None None N/A Periwound Skin Texture: Induration: Yes No Abnormalities Noted N/A Excoriation: No Callus: No Crepitus: No Rash: No Scarring: No Periwound Skin Maceration: Yes Maceration: Yes N/A Moisture: Dry/Scaly: No Periwound Skin Color: Atrophie Blanche: No Ecchymosis: Yes N/A Cyanosis: No Ecchymosis: No Erythema: No Hemosiderin Staining: No Mottled: No Pallor: No Rubor: No Temperature: No Abnormality No Abnormality N/A Tenderness on Yes Yes N/A Palpation: Wound Preparation: Ulcer Cleansing: Ulcer Cleansing: N/A Rinsed/Irrigated with Rinsed/Irrigated with Saline Saline Topical Anesthetic Topical Anesthetic Applied: Other: lidocaine Applied: Other: lidocaine 4% 4% Treatment Notes Electronic Signature(s) Signed: 05/23/2017 4:43:28 PM By: Gretta Cool, [name], RN, CWS, [name], [name] Bohannon, Takyra Chauncey Cruel ([id number]) Entered By: Gretta Cool, [name], RN, CWS, Kim on 05/23/2017 14:32:22 [place]field, [name] ([id number]) -------------------------------------------------------------------------------- [place] Details Patient Name: LYLLIANA, KITAMURA. Date of Service: 05/23/2017 2:30 PM Medical Record Number: [id number] Patient Account Number: [id number] Date of Birth/Sex: [birth year] (51 y.o. Female) Treating RN: Cornell Barman Primary Care Anjelica Gorniak: Delight Stare Other Clinician: Referring Larysa Pall: Delight Stare Treating Tiant Peixoto/Extender: Melburn Hake, HOYT Weeks in Treatment: 17 Active Inactive ` Orientation to the Wound Care Program Nursing Diagnoses: Knowledge deficit related to the wound healing center program Goals: Patient/caregiver will verbalize understanding of the [facility] Program Date Initiated: 01/23/2017 Target Resolution Date: 05/25/2017 Goal Status: Active Interventions: Provide education on orientation to  the wound center Notes: ` Peripheral Neuropathy Nursing Diagnoses: Knowledge deficit related to disease process and management of peripheral neurovascular dysfunction Potential alteration in peripheral tissue perfusion (select prior to confirmation of diagnosis) Goals: Patient/caregiver will verbalize understanding of disease process and disease management Date Initiated: 01/23/2017 Target Resolution Date: 05/25/2017 Goal Status: Active Interventions: Assess signs and symptoms of neuropathy upon admission and as needed Provide education on Management of Neuropathy and Related Ulcers Provide education on Management of Neuropathy upon discharge from the [facility] Activities: Test ordered outside of clinic : 01/23/2017 Notes: KRISTY, SCHOMBURG ([id number]) ` Pressure Nursing Diagnoses: Knowledge deficit related to causes and risk factors for pressure ulcer development Knowledge deficit related to management of pressures ulcers Potential for impaired tissue integrity related to pressure, friction, moisture, and shear Goals: Patient will remain free from development  of additional pressure ulcers Date Initiated: 01/23/2017 Target Resolution Date: 05/25/2017 Goal Status: Active Patient will remain free of pressure ulcers Date Initiated: 01/23/2017 Target Resolution Date: 05/25/2017 Goal Status: Active Patient/caregiver will verbalize risk factors for pressure ulcer development Date Initiated: 01/23/2017 Target Resolution Date: 05/25/2017 Goal Status: Active Patient/caregiver will verbalize understanding of pressure ulcer management Date Initiated: 01/23/2017 Target Resolution Date: 05/25/2017 Goal Status: Active Interventions: Assess: immobility, friction, shearing, incontinence upon admission and as needed Assess offloading mechanisms upon admission and as needed Assess potential for pressure ulcer upon admission and as needed Provide education on pressure ulcers Treatment  Activities: Patient referred for pressure reduction/relief devices : 01/23/2017 Patient referred for seating evaluation to ensure proper offloading : 01/23/2017 Pressure reduction/relief device ordered : 01/23/2017 Notes: ` Wound/Skin Impairment Nursing Diagnoses: Impaired tissue integrity Knowledge deficit related to smoking impact on wound healing Knowledge deficit related to ulceration/compromised skin integrity Goals: Patient/caregiver will verbalize understanding of skin care regimen [name], [name] ([id number]) Date Initiated: 01/23/2017 Target Resolution Date: 05/25/2017 Goal Status: Active Ulcer/skin breakdown will have a volume reduction of 30% by week 4 Date Initiated: 01/23/2017 Target Resolution Date: 05/25/2017 Goal Status: Active Ulcer/skin breakdown will have a volume reduction of 50% by week 8 Date Initiated: 01/23/2017 Target Resolution Date: 05/25/2017 Goal Status: Active Ulcer/skin breakdown will have a volume reduction of 80% by week 12 Date Initiated: 01/23/2017 Target Resolution Date: 05/25/2017 Goal Status: Active Ulcer/skin breakdown will heal within 14 weeks Date Initiated: 01/23/2017 Target Resolution Date: 05/25/2017 Goal Status: Active Interventions: Assess patient/caregiver ability to obtain necessary supplies Assess patient/caregiver ability to perform ulcer/skin care regimen upon admission and as needed Assess ulceration(s) every visit Provide education on smoking Provide education on ulcer and skin care Treatment Activities: Patient referred to home care : 01/23/2017 Referred to DME Obediah Welles for dressing supplies : 01/23/2017 Skin care regimen initiated : 01/23/2017 Topical wound management initiated : 01/23/2017 Notes: Electronic Signature(s) Signed: 05/23/2017 4:43:28 PM By: Gretta Cool, [name], RN, CWS, [name], [name] Entered By: Gretta Cool, [name], RN, CWS, Kim on 05/23/2017 14:32:12 Manthe, [name]  ([id number]) -------------------------------------------------------------------------------- Pain Assessment Details Patient Name: [name], [name]. Date of Service: 05/23/2017 2:30 PM Medical Record Number: [id number] Patient Account Number: [id number] Date of Birth/Sex: 06/07/1966 (51 y.o. Female) Treating RN: Cornell Barman Primary Care Rydge Texidor: Delight Stare Other Clinician: Referring Jonta Gastineau: Delight Stare Treating Shreya Lacasse/Extender: Melburn Hake, HOYT Weeks in Treatment: 17 Active Problems Location of Pain Severity and Description of Pain Patient Has Paino No Site Locations With Dressing Change: No Pain Management and Medication Current Pain Management: Goals for Pain Management Topical or injectable lidocaine is offered to patient for acute pain when surgical debridement is performed. If needed, Patient is instructed to use over the counter pain medication for the following 24-48 hours after debridement. Wound care MDs do not prescribed pain medications. Patient has chronic pain or uncontrolled pain. Patient has been instructed to make an appointment with their Primary Care Physician for pain management. Electronic Signature(s) Signed: 05/23/2017 4:43:28 PM By: Gretta Cool, [name], RN, CWS, [name], [name] Entered By: Gretta Cool, [name], RN, CWS, Kim on 05/23/2017 14:24:10 Mcgrory, [name] ([id number]) -------------------------------------------------------------------------------- Patient/Caregiver Education Details Patient Name: CITLALY, CAMPLIN. Date of Service: 05/23/2017 2:30 PM Medical Record Number: [id number] Patient Account Number: [id number] Date of Birth/Gender: January 09, 1966 (51 y.o. Female) Treating RN: Cornell Barman Primary Care Physician: Delight Stare Other Clinician: Referring Physician: Delight Stare Treating Physician/Extender: Sharalyn Ink in Treatment: 17 Education Assessment Education Provided To: Patient Education Topics Provided Peripheral  Neuropathy: Handouts:  Neuropathy Methods: Demonstration Responses: State content correctly Smoking and Wound Healing: Handouts: Smoking and Wound Healing Methods: Explain/Verbal Responses: State content correctly Wound/Skin Impairment: Handouts: Caring for Your Ulcer Methods: Demonstration Responses: State content correctly Electronic Signature(s) Signed: 05/23/2017 4:43:28 PM By: Gretta Cool, [name], RN, CWS, [name], [name] Entered By: Gretta Cool, [name], RN, CWS, Kim on 05/23/2017 14:52:08 Carel, [name] ([id number]) -------------------------------------------------------------------------------- Wound Assessment Details Patient Name: [name], [name]. Date of Service: 05/23/2017 2:30 PM Medical Record Number: [id number] Patient Account Number: [id number] Date of Birth/Sex: 06-19-1966 (51 y.o. Female) Treating RN: Cornell Barman Primary Care Jimmye Wisnieski: Delight Stare Other Clinician: Referring Dion Sibal: Delight Stare Treating Barth Trella/Extender: Melburn Hake, HOYT Weeks in Treatment: 17 Wound Status Wound Number: 1 Primary Diabetic Wound/Ulcer of the Lower Etiology: Extremity Wound Location: Right Malleolus - Lateral Wound Open Wounding Event: Gradually Appeared Status: Date Acquired: 09/23/2016 Comorbid Anemia, Arrhythmia, Coronary Artery Weeks Of Treatment: 17 History: Disease, Hypotension, Peripheral Clustered Wound: No Arterial Disease, Type II Diabetes, Osteoarthritis Photos Wound Measurements Length: (cm) 1.1 Width: (cm) 1.2 Depth: (cm) 0.2 Area: (cm) 1.037 Volume: (cm) 0.207 % Reduction in Area: 73.6% % Reduction in Volume: 89.5% Epithelialization: None Tunneling: No Undermining: No Wound Description Classification: Grade 2 Foul Odor Aft Wound Margin: Distinct, outline attached Slough/Fibrin Exudate Amount: Large Exudate Type: Purulent Exudate Color: yellow, brown, green er Cleansing: No o Yes Wound Bed Granulation Amount: Large (67-100%) Exposed Structure Granulation Quality: Pink Fascia Exposed:  No Necrotic Amount: Small (1-33%) Fat Layer (Subcutaneous Tissue) Exposed: Yes Necrotic Quality: Adherent Slough Tendon Exposed: No Muscle Exposed: No Joint Exposed: No Rutan, [name] ([id number]) Bone Exposed: No Periwound Skin Texture Texture Color No Abnormalities Noted: No No Abnormalities Noted: No Callus: No Atrophie Blanche: No Crepitus: No Cyanosis: No Excoriation: No Ecchymosis: No Induration: Yes Erythema: No Rash: No Hemosiderin Staining: No Scarring: No Mottled: No Pallor: No Moisture Rubor: No No Abnormalities Noted: No Dry / Scaly: No Temperature / Pain Maceration: Yes Temperature: No Abnormality Tenderness on Palpation: Yes Wound Preparation Ulcer Cleansing: Rinsed/Irrigated with Saline Topical Anesthetic Applied: Other: lidocaine 4%, Treatment Notes Wound #1 (Right, Lateral Malleolus) 1. Cleansed with: Clean wound with Normal Saline 2. Anesthetic Topical Lidocaine 4% cream to wound bed prior to debridement 4. Dressing Applied: Aquacel Ag Prisma Ag 5. Secondary Dressing Applied Bordered Foam Dressing Notes Prisma ankle, Aq Ag gluteus Electronic Signature(s) Signed: 05/23/2017 4:43:28 PM By: Gretta Cool, [name], RN, CWS, [name], [name] Entered By: Gretta Cool, [name], RN, CWS, Kim on 05/23/2017 14:31:22 [place], [name] ([id number]) -------------------------------------------------------------------------------- Wound Assessment Details Patient Name: [name], DUBA. Date of Service: 05/23/2017 2:30 PM Medical Record Number: [id number] Patient Account Number: [id number] Date of Birth/Sex: [birth year] (51 y.o. Female) Treating RN: Cornell Barman Primary Care Govind Furey: Delight Stare Other Clinician: Referring Jacquelyne Quarry: Delight Stare Treating Dick Hark/Extender: Melburn Hake, HOYT Weeks in Treatment: 17 Wound Status Wound Number: 2 Primary Open Surgical Wound Etiology: Wound Location: Right Gluteus Wound Open Wounding Event: Gradually Appeared Status: Date Acquired:  09/18/2016 Comorbid Anemia, Arrhythmia, Coronary Artery Weeks Of Treatment: 17 History: Disease, Hypotension, Peripheral Clustered Wound: No Arterial Disease, Type II Diabetes, Osteoarthritis Photos Wound Measurements Length: (cm) 2 Width: (cm) 1.5 Depth: (cm) 1.8 Area: (cm) 2.356 Volume: (cm) 4.241 % Reduction in Area: 87.7% % Reduction in Volume: 95.6% Epithelialization: None Tunneling: No Undermining: Yes Starting Position (o'clock): 2 Ending Position (o'clock): 9 Maximum Distance: (cm) 0.6 Wound Description Full Thickness Without Exposed Classification: Support Structures Wound Margin: Distinct, outline attached Exudate Large Amount: Exudate Type: Serous Exudate  Color: amber Foul Odor After Cleansing: No Slough/Fibrino No Wound Bed Helvie, Areona S. ([id number]) Granulation Amount: Medium (34-66%) Exposed Structure Granulation Quality: Red, Pink Fascia Exposed: No Necrotic Amount: Medium (34-66%) Fat Layer (Subcutaneous Tissue) Exposed: Yes Necrotic Quality: Adherent Slough Tendon Exposed: No Muscle Exposed: No Joint Exposed: No Bone Exposed: No Periwound Skin Texture Texture Color No Abnormalities Noted: No No Abnormalities Noted: No Ecchymosis: Yes Moisture No Abnormalities Noted: No Temperature / Pain Maceration: Yes Temperature: No Abnormality Tenderness on Palpation: Yes Wound Preparation Ulcer Cleansing: Rinsed/Irrigated with Saline Topical Anesthetic Applied: Other: lidocaine 4%, Treatment Notes Wound #2 (Right Gluteus) 1. Cleansed with: Clean wound with Normal Saline 2. Anesthetic Topical Lidocaine 4% cream to wound bed prior to debridement 4. Dressing Applied: Aquacel Ag Prisma Ag 5. Secondary Dressing Applied Bordered Foam Dressing Notes Prisma ankle, Aq Ag gluteus Electronic Signature(s) Signed: 05/23/2017 4:43:28 PM By: Gretta Cool, [name], RN, CWS, [name], [name] Entered By: Gretta Cool, [name], RN, CWS, Kim on 05/23/2017 14:30:52 [place]am, [name] ([id number]) -------------------------------------------------------------------------------- [place] Details Patient Name: Jamie Martyr. Date of Service: 05/23/2017 2:30 PM Medical Record Number: [id number] Patient Account Number: [id number] Date of Birth/Sex: June 09, 1966 (51 y.o. Female) Treating RN: Cornell Barman Primary Care Maryjean Corpening: Delight Stare Other Clinician: Referring Collyn Selk: Delight Stare Treating [place] Daywalt/Extender: Melburn Hake, HOYT Weeks in Treatment: 17 Vital Signs Time Taken: 14:24 Temperature (F): 98.3 Height (in): 67 Pulse (bpm): 85 Weight (lbs): 137 Respiratory Rate (breaths/min): 16 Body Mass Index (BMI): 21.5 Blood Pressure (mmHg): 66/39 Reference Range: 80 - 120 mg / dl Electronic Signature(s) Signed: 05/23/2017 4:43:28 PM By: Gretta Cool, [name], RN, CWS, [name], [name] Entered By: Gretta Cool, [name], RN, CWS, Kim on 05/23/2017 14:24:30

## 2017-05-24 NOTE — Progress Notes (Signed)
HADASSAH, RANA ([id number]) Visit Report for 05/23/2017 Chief Complaint Document Details Patient Name: [name], [name]. Date of Service: 05/23/2017 2:30 PM Medical Record Number: [id number] Patient Account Number: [id number] Date of Birth/Sex: 1966/06/21 (51 y.o. Female) Treating RN: Cornell Barman Primary Care Provider: Delight Stare Other Clinician: Referring Provider: Delight Stare Treating Provider/Extender: Melburn Hake, HOYT Weeks in Treatment: 17 Information Obtained from: Patient Chief Complaint Patients presents for treatment of an open diabetic ulcer with an arterial etiology on her right lateral ankle and right gluteal area Electronic Signature(s) Signed: 05/23/2017 4:47:40 PM By: Worthy Keeler PA-C Entered By: Worthy Keeler on 05/23/2017 14:35:18 Lanz, Georgean S. ([id number]) -------------------------------------------------------------------------------- HPI Details Patient Name: [name]. Date of Service: 05/23/2017 2:30 PM Medical Record Number: [id number] Patient Account Number: [id number] Date of Birth/Sex: May 18, 1966 (51 y.o. Female) Treating RN: Cornell Barman Primary Care Provider: Delight Stare Other Clinician: Referring Provider: Delight Stare Treating Provider/Extender: Melburn Hake, HOYT Weeks in Treatment: 17 History of Present Illness Location: right lateral ankle and right gluteal area Quality: Patient reports experiencing a sharp pain to affected area(s). Severity: Patient states wound are getting better slowly Duration: Patient has had the wound for > 4 months prior to seeking treatment at the wound center Timing: Pain in wound is constant (hurts all the time) Context: The wound occurred when the patient was in hospital with a necrotizing fasciitis of the right gluteal area and a ulceration on the right ankle Modifying Factors: Other treatment(s) tried include:admitted to the hospital for IV antibiotics and a full workup and has also had a recent  angiogram Associated Signs and Symptoms: Patient reports having increase discharge. HPI Description: 51 year old patient was sent to [place] from [facility] where she was seen by Dr. Sherril Cong for a left ankle ulceration and was recently hospitalized with hypotension and sepsis. She was treated with IV antibiotics and has been scheduled to see Dr. Sharol Given. She was seen by vascular surgery who recommended a femoral bypass but surgery has been delayed until her sacral wound from last year's necrotizing fasciitis has healed. Was seeing the wound care team at [facility] but wanted to change over. She is a smoker and smokes a pack of cigarettes a day The patient was recently admitted in [place] between February 2 and February 14. She had a follow- up to see vascular surgery, orthopedic surgery and infectious disease. 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 Review of the electronic medical records reveals that Dr. 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 An MRI showed edema in the bone which was consistent with osteomyelitis and the chronic ulcer and may need surgical intervention. She also had a sacral decubitus ulcer which was treated with the wound VAC. On 01/07/2017 she was taken up by the vascular surgeon Dr. Trula Slade for an abdominal aortogram and bilateral lower extremity runoff, for a history of having bilateral femoropopliteal bypass graft as well as external iliac stenting on the right and stenting of her bypass graft. She had developed a nonhealing  wound on her right ankle and there was a possibility of a femoral occlusion. the findings were  noted and the impression was a surgical revascularization with a aorto bifemoral bypass graft. Both the femoropopliteal bypass grafts were patent. 01/31/2017 -- x-ray of the right hip and pelvis -- IMPRESSION:No radiographic evidence of acute osteomyelitis. Normal-appearing right hip joint space for age. No acute bony abnormality of the hip. 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 Since seeing her last week she has not had an appointment with infectious disease or the orthopedic specialist yet. LORIANNA, [name] ([id number]) 02/06/2017 -- the patient missed a couple of appointments yesterday due to the weather but other than that has apparently given up smoking for the last 5 days. 02/13/2017 -- she has rescheduled her infectious disease appointment and also the appointment with the orthopedic surgeon Dr. Sharol Given 03/06/2017 -- she was seen by Dr. Lucianne Lei dam of infectious disease on 03/05/2017 and he recommendedIV antibiotics but the patient did not want to have that and he has given her Augmentin and doxycycline and will reevaluate her in 2 months time. 03/13/2017 -- he saw Dr. Trula Slade regarding her vascular issues and he would like to wait to the sacral wound is completely closed before considering aortobifemoral bypass graft. He will see her back in 2 months time. She was also seen by Dr. Sharol Given of orthopedics who recommended continue wound care dressings and follow in the office in about 2 months time. he also recommended 3 view radiographs of the right ankle at follow-up. 03/28/2017 -- she did have a PICC line placed and Dr. Lucianne Lei dam has begun IV vancomycin and ceftriaxone. she is going to continue this until she sees him in approximately a month's time 04/18/2017 -- we had applied for a skin substitute for the patient's care but her copayment  is going to be about $300 a piece and the patient will not be able to afford this. 05/08/17 on evaluation today patient appears to potentially have a fungal rash in the periwound region of the sacral area. This also seems to extend into the inguinal creases and factional region as well. She is tender to palpation with light rubbing over the region of the periwound and the skin in this region does have a beefy red appearance. Everything seems to point to a fungal infection at this time. She has been tolerating the dressing changes up to this point. There is no evidence of infection otherwise. 05/16/2017 -- was seen by Dr. 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 He did not think that she is making progress and was not able to examine her sacral decubitus ulcer as the patient would not allow it. She was switched to Bactrim DS 2 tablets twice a day since finishing her antibiotics. Her ESR was 76 and a CRP was 2.8. 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 05/23/17 on evaluation today patient's wound appears to be doing about the same in regard to the ankle as well as the gluteal area. She apparently has issues with the one back staying in place and she states the sill seems to break up the inferior aspect. She does have home help coming out to apply the wound VAC. She has been tolerating the dressing changes without any problem although she has had some increased pain in the gluteal region due to a  fall she sustained and she feels like she brews that area and she had pain actually radiating down her leg which is slowly beginning to improve. There's no evidence of infection. Electronic Signature(s) Signed: 05/23/2017 4:47:40 PM By: Worthy Keeler PA-C Entered By: Worthy Keeler on 05/23/2017 14:46:24 Waldman,  Yulanda Chauncey Cruel ([id number]) -------------------------------------------------------------------------------- Physical Exam Details Patient Name: STEPHENE, ALEGRIA S. Date of Service: 05/23/2017 2:30 PM Medical Record Number: [id number] Patient Account Number: [id number] Date of Birth/Sex: 09/05/1966 (51 y.o. Female) Treating RN: Cornell Barman Primary Care Provider: Delight Stare Other Clinician: Referring Provider: Delight Stare Treating Provider/Extender: STONE III, HOYT Weeks in Treatment: 38 Constitutional Well-nourished and well-hydrated in no acute distress. Respiratory normal breathing without difficulty. clear to auscultation bilaterally. Cardiovascular regular rate and rhythm with normal S1, S2. Psychiatric this patient is able to make decisions and demonstrates good insight into disease process. Alert and Oriented x 3. pleasant and cooperative. Notes Patient has slough noted in the gluteal region although she is too synergetic debridement today. She does have good granulation tissue noted in the lateral ankle unfortunately this does not seem to be progressing and this one has been present for seven years. Electronic Signature(s) Signed: 05/23/2017 4:47:40 PM By: Worthy Keeler PA-C Entered By: Worthy Keeler on 05/23/2017 14:47:24 Ruffalo, [name] ([id number]) -------------------------------------------------------------------------------- Physician Orders Details Patient Name: MIZANI, DILDAY. Date of Service: 05/23/2017 2:30 PM Medical Record Number: [id number] Patient Account Number: [id number] Date of Birth/Sex: 06-19-1966 (51 y.o. 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 Wound #2 Right Gluteus o Clean wound with Normal Saline. Anesthetic Wound #1 Right,Lateral Malleolus o Topical Lidocaine 4% cream applied to wound bed prior to debridement Wound #2 Right Gluteus o Topical Lidocaine 4% cream applied to wound bed prior to debridement Primary Wound Dressing Wound #1 Right,Lateral Malleolus o Prisma Ag Wound #2 Right Gluteus o Aquacel Ag - In clinic o Prisma Ag - or equal under wound vac. Secondary Dressing Wound #1 Right,Lateral Malleolus o Boardered Foam Dressing Hamblen, Fatimah S. ([id number]) Wound #2 Right Gluteus o Boardered Foam Dressing Dressing Change Frequency Wound #1 Right,Lateral Malleolus o Change Dressing Monday, Wednesday, Friday Wound #2 Right Gluteus o Change Dressing Monday, Wednesday, Friday Follow-up Appointments Wound #1 Right,Lateral Malleolus o Return Appointment in 1 week. Wound #2 Right Gluteus o Return Appointment in 1 week. Home Health Wound #1 [place] Visits - Monday, Wednesday, Friday o Home Health Nurse may visit PRN to address patientos wound care needs. o FACE TO FACE ENCOUNTER: MEDICARE and MEDICAID PATIENTS: I certify that this patient is under my care and that I had a face-to-face encounter that meets the physician face-to-face encounter requirements with this patient on this date. The encounter with the patient was in whole or in part for the following MEDICAL CONDITION: (primary reason for [place]) MEDICAL NECESSITY: I certify, that based on my findings,  NURSING services are a medically necessary  home health service. HOME BOUND STATUS: I certify that my clinical findings support that this patient is homebound (i.e., Due to illness or injury, pt requires aid of supportive devices such as crutches, cane, wheelchairs, walkers, the use of special transportation or the assistance of another person to leave their place of residence. There is a normal inability to leave the home and doing so requires considerable and taxing effort. Other absences are for medical reasons / religious services and are infrequent or of short duration when for other reasons). o If current dressing causes regression in wound condition, may D/C ordered dressing product/s and apply Normal Saline Moist Dressing daily until next [place] / Other MD appointment. [place] of regression in wound condition at [phone]. o Please direct any NON-WOUND related issues/requests for orders to patient's Primary Care Physician Wound #2 Right [place] Visits - Monday, Wednesday, Friday o Home Health Nurse may visit PRN to address patientos wound care needs. o FACE TO FACE ENCOUNTER: MEDICARE and MEDICAID PATIENTS: I certify that this patient is under my care and that I had a face-to-face encounter that meets the physician face-to-face encounter requirements with this patient on this date. The encounter with the patient was in whole or in part for the following MEDICAL CONDITION: (primary reason for [place]) MEDICAL NECESSITY: I certify, that based on my findings, NURSING services are a medically DMIYA, MALPHRUS. ([id number]) necessary home health service. HOME BOUND STATUS: I certify that my clinical findings support that this patient is homebound (i.e., Due to illness or injury, pt requires aid of supportive devices such as crutches, cane, wheelchairs, walkers, the use of special transportation or the assistance of  another person to leave their place of residence. There is a normal inability to leave the home and doing so requires considerable and taxing effort. Other absences are for medical reasons / religious services and are infrequent or of short duration when for other reasons). o If current dressing causes regression in wound condition, may D/C ordered dressing product/s and apply Normal Saline Moist Dressing daily until next [place] / Other MD appointment. [place] of regression in wound condition at [phone]. o Please direct any NON-WOUND related issues/requests for orders to patient's Primary Care Physician Negative Pressure Wound Therapy Wound #2 Right Gluteus o Wound VAC settings at 125/130 mmHg continuous pressure. Use BLACK/GREEN foam to wound cavity. Use WHITE foam to fill any tunnel/s and/or undermining. Change VAC dressing 2 X WEEK. Change canister as indicated when full. Nurse may titrate settings and frequency of dressing changes as clinically indicated. - Collegen dressing under wound [place] Nurse may d/c VAC for s/s of increased infection, significant wound regression, or uncontrolled drainage. [place] at [phone]. o Apply contact layer over base of wound. o Number of foam/gauze pieces used in the dressing = o Place NPWT on HOLD. o Other: - Re-apply NPWT beginning Monday July 2. Notes We will continue with the Current wound care measures for the next week and see were things stand following. If anything worsen sneakily in the meantime she will contact the office for additional recommendations. Electronic Signature(s) Signed: 05/23/2017 4:47:40 PM By: Worthy Keeler PA-C Entered By: Worthy Keeler on 05/23/2017 14:48:03 Orvis, Kalesha Chauncey Cruel ([id number]) -------------------------------------------------------------------------------- Problem List Details Patient Name: REINE, BRISTOW. Date of  Service: 05/23/2017 2:30 PM Medical Record Number: [id number] Patient Account Number: [id number] Date of Birth/Sex: May 21, 1966 (51  y.o. Female) Treating RN: Cornell Barman Primary Care Provider: Delight Stare Other Clinician: Referring Provider: Delight Stare Treating Provider/Extender: Melburn Hake, HOYT Weeks in Treatment: 17 Active Problems ICD-10 Encounter Code Description Active Date Diagnosis E11.621 Type 2 diabetes mellitus with foot ulcer 01/23/2017 Yes L89.314 Pressure ulcer of right buttock, stage 4 01/23/2017 Yes L97.312 Non-pressure chronic ulcer of right ankle with fat layer 01/23/2017 Yes exposed F17.218 Nicotine dependence, cigarettes, with other nicotine- 01/23/2017 Yes induced disorders I70.233 Atherosclerosis of native arteries of right leg with 01/23/2017 Yes ulceration of ankle M86.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, right ankle and foot 01/23/2017 Yes Inactive Problems Resolved Problems Electronic Signature(s) Signed: 05/23/2017 4:47:40 PM By: Worthy Keeler PA-C Entered By: Worthy Keeler on 05/23/2017 14:34:51 Swanner, Caree S. ([id number]) -------------------------------------------------------------------------------- Progress Note Details Patient Name: [name]. Date of Service: 05/23/2017 2:30 PM Medical Record Number: [id number] Patient Account Number: [id number] Date of Birth/Sex: 20-Jun-1966 (51 y.o. Female) Treating RN: Cornell Barman Primary Care Provider: Delight Stare Other Clinician: Referring Provider: Delight Stare Treating Provider/Extender: Melburn Hake, HOYT Weeks in Treatment: 17 Subjective Chief Complaint Information obtained from Patient Patients presents for treatment of an open diabetic ulcer with an arterial etiology on her right lateral ankle and right gluteal area History of Present Illness (HPI) The following HPI elements were documented for the patient's wound: Location: right lateral ankle and right gluteal area Quality: Patient reports experiencing  a sharp pain to affected area(s). Severity: Patient states wound are getting better slowly Duration: Patient has had the wound for > 4 months prior to seeking treatment at the wound center Timing: Pain in wound is constant (hurts all the time) Context: The wound occurred when the patient was in hospital with a necrotizing fasciitis of the right gluteal area and a ulceration on the right ankle Modifying Factors: Other treatment(s) tried include:admitted to the hospital for IV antibiotics and a full workup and has also had a recent angiogram Associated Signs and Symptoms: Patient reports having increase discharge. 51 year old patient was sent to [place] from [facility] where she was seen by Dr. Sherril Cong for a left ankle ulceration and was recently hospitalized with hypotension and sepsis. She was treated with IV antibiotics and has been scheduled to see Dr. Sharol Given. She was seen by vascular surgery who recommended a femoral bypass but surgery has been delayed until her sacral wound from last year's necrotizing fasciitis has healed. Was seeing the wound care team at [facility] but wanted to change over. She is a smoker and smokes a pack of cigarettes a day The patient was recently admitted in [place] between February 2 and February 14. She had a follow- up to see vascular surgery, orthopedic surgery and infectious disease. 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 Review of the electronic medical records reveals that Dr. 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  An MRI showed edema in the bone which was consistent with osteomyelitis and the chronic ulcer and may need surgical intervention. She also had a sacral decubitus ulcer which was treated with the wound VAC. On 01/07/2017 she was taken up by the vascular surgeon Dr.  Brabham for an abdominal aortogram and bilateral lower extremity runoff, for a history of having bilateral femoropopliteal bypass graft as well as external iliac stenting on the right and stenting of her bypass graft. She had developed a nonhealing wound on her right ankle and there was a possibility of a femoral occlusion. the findings were noted and the TUERE, [facility]. ([id number]) impression was a surgical revascularization with a aorto bifemoral bypass graft. Both the femoropopliteal bypass grafts were patent. 01/31/2017 -- x-ray of the right hip and pelvis -- IMPRESSION:No radiographic evidence of acute osteomyelitis. Normal-appearing right hip joint space for age. No acute bony abnormality of the hip. 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 Since seeing her last week she has not had an appointment with infectious disease or the orthopedic specialist yet. 02/06/2017 -- the patient missed a couple of appointments yesterday due to the weather but other than that has apparently given up smoking for the last 5 days. 02/13/2017 -- she has rescheduled her infectious disease appointment and also the appointment with the orthopedic surgeon Dr. Sharol Given 03/06/2017 -- she was seen by Dr. Lucianne Lei dam of infectious disease on 03/05/2017 and he recommendedIV antibiotics but the patient did not want to have that and he has given her Augmentin and doxycycline and will reevaluate her in 2 months time. 03/13/2017 -- he saw Dr. Trula Slade regarding her vascular issues and he would like to wait to the sacral wound is completely closed before considering aortobifemoral bypass graft. He will see her  back in 2 months time. She was also seen by Dr. Sharol Given of orthopedics who recommended continue wound care dressings and follow in the office in about 2 months time. he also recommended 3 view radiographs of the right ankle at follow-up. 03/28/2017 -- she did have a PICC line placed and Dr. Lucianne Lei dam has begun IV vancomycin and ceftriaxone. she is going to continue this until she sees him in approximately a month's time 04/18/2017 -- we had applied for a skin substitute for the patient's care but her copayment is going to be about $300 a piece and the patient will not be able to afford this. 05/08/17 on evaluation today patient appears to potentially have a fungal rash in the periwound region of the sacral area. This also seems to extend into the inguinal creases and factional region as well. She is tender to palpation with light rubbing over the region of the periwound and the skin in this region does have a beefy red appearance. Everything seems to point to a fungal infection at this time. She has been tolerating the dressing changes up to this point. There is no evidence of infection otherwise. 05/16/2017 -- was seen by Dr. 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 He did not think that she is making progress and was not able to examine her sacral decubitus ulcer as the patient would not allow it. She was switched to Bactrim DS 2 tablets twice a day since finishing her antibiotics. Her ESR was 76 and a CRP was 2.8. 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 05/23/17 on evaluation today patient's wound appears to be doing about the same in regard to the ankle as well as the gluteal area. She apparently has  issues with the one back staying in place and she states the sill seems to break up the inferior aspect. She  does have home help coming out to apply the wound VAC. She has been tolerating the dressing changes without any problem although she has had some increased pain in the gluteal region due to a fall she sustained and she feels like she brews that area and she had pain actually radiating down her leg which is slowly beginning to improve. There's no evidence of infection. KAELAH, HAYASHI ([id number]) Objective Constitutional Well-nourished and well-hydrated in no acute distress. Vitals Time Taken: 2:24 PM, Height: 67 in, Weight: 137 lbs, BMI: 21.5, Temperature: 98.3 F, Pulse: 85 bpm, Respiratory Rate: 16 breaths/min, Blood Pressure: 66/39 mmHg. Respiratory normal breathing without difficulty. clear to auscultation bilaterally. Cardiovascular regular rate and rhythm with normal S1, S2. Psychiatric this patient is able to make decisions and demonstrates good insight into disease process. Alert and Oriented x 3. pleasant and cooperative. General Notes: Patient has slough noted in the gluteal region although she is too synergetic debridement today. She does have good granulation tissue noted in the lateral ankle unfortunately this does not seem to be progressing and this one has been present for seven years. Integumentary (Hair, Skin) Wound #1 status is Open. Original cause of wound was Gradually Appeared. The wound is located on the Right,Lateral Malleolus. The wound measures 1.1cm length x 1.2cm width x 0.2cm depth; 1.037cm^2 area and 0.207cm^3 volume. There is Fat Layer (Subcutaneous Tissue) Exposed exposed. There is no tunneling or undermining noted. There is a large amount of purulent drainage noted. The wound margin is distinct with the outline attached to the wound base. There is large (67-100%) pink granulation within the wound bed. There is a small (1-33%) amount of necrotic tissue within the wound bed including Adherent Slough. The periwound skin appearance exhibited: Induration,  Maceration. The periwound skin appearance did not exhibit: Callus, Crepitus, Excoriation, Rash, Scarring, Dry/Scaly, Atrophie Blanche, Cyanosis, Ecchymosis, Hemosiderin Staining, Mottled, Pallor, Rubor, Erythema. Periwound temperature was noted as No Abnormality. The periwound has tenderness on palpation. Wound #2 status is Open. Original cause of wound was Gradually Appeared. The wound is located on the Right Gluteus. The wound measures 2cm length x 1.5cm width x 1.8cm depth; 2.356cm^2 area and 4.241cm^3 volume. There is Fat Layer (Subcutaneous Tissue) Exposed exposed. There is no tunneling noted, however, there is undermining starting at 2:00 and ending at 9:00 with a maximum distance of 0.6cm. There is a large amount of serous drainage noted. The wound margin is distinct with the outline attached to the wound base. There is medium (34-66%) red, pink granulation within the wound bed. There is a medium (34-66%) amount of necrotic tissue within the wound bed including Adherent Slough. The periwound skin appearance exhibited: Maceration, Ecchymosis. Periwound temperature was noted as No Abnormality. The periwound has tenderness on palpation. 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 Wound #2 Right Gluteus: Clean wound with Normal Saline. Anesthetic: Wound #1 Right,Lateral Malleolus: Topical Lidocaine 4% cream applied to wound bed prior to  debridement Wound #2 Right  Gluteus: Topical Lidocaine 4% cream applied to wound bed prior to debridement Primary Wound Dressing: Wound #1 Right,Lateral Malleolus: Prisma Ag Wound #2 Right Gluteus: Aquacel Ag - In clinic Prisma Ag - or equal under wound vac. Secondary Dressing: Wound #1 Right,Lateral Malleolus: Boardered Foam Dressing Wound #2 Right Gluteus: Boardered Foam Dressing Dressing Change Frequency: Wound #1 Right,Lateral Malleolus: Change Dressing Monday, Wednesday, Friday Wound #2 Right Gluteus: Change Dressing Monday, Wednesday, Friday Follow-up Appointments: Wound #1 Right,Lateral Malleolus: Return Appointment in 1 week. LUCYLLE, FOULKES ([id number]) Wound #2 Right Gluteus: Return Appointment in 1 week. Home Health: Wound #1 Right,Lateral Malleolus: [place] Visits - Monday, Wednesday, Friday [place] Nurse may visit PRN to address patient s wound care needs. FACE TO FACE ENCOUNTER: MEDICARE and MEDICAID PATIENTS: I certify that this patient is under my care and that I had a face-to-face encounter that meets the physician face-to-face encounter requirements with this patient on this date. The encounter with the patient was in whole or in part for the following MEDICAL CONDITION: (primary reason for [place]) MEDICAL NECESSITY: I certify, that based on my findings, NURSING services are a medically necessary home health service. HOME BOUND STATUS: I certify that my clinical findings support that this patient is homebound (i.e., Due to illness or injury, pt requires aid of supportive devices such as crutches, cane, wheelchairs, walkers, the use of special transportation or the assistance of another person to leave their place of residence. There is a normal inability to leave the home and doing so requires considerable and taxing effort. Other absences are for medical reasons / religious services and are infrequent or of short duration when for other reasons). If current  dressing causes regression in wound condition, may D/C ordered dressing product/s and apply Normal Saline Moist Dressing daily until next [place] / Other MD appointment. [place] of regression in wound condition at [phone]. Please direct any NON-WOUND related issues/requests for orders to patient's Primary Care Physician Wound #2 Right Gluteus: [place] Visits - Monday, Wednesday, Friday Home Health Nurse may visit PRN to address patient s wound care needs. FACE TO FACE ENCOUNTER: MEDICARE and MEDICAID PATIENTS: I certify that this patient is under my care and that I had a face-to-face encounter that meets the physician face-to-face encounter requirements with this patient on this date. The encounter with the patient was in whole or in part for the following MEDICAL CONDITION: (primary reason for [place]) MEDICAL NECESSITY: I certify, that based on my findings, NURSING services are a medically necessary home health service. HOME BOUND STATUS: I certify that my clinical findings support that this patient is homebound (i.e., Due to illness or injury, pt requires aid of supportive devices such as crutches, cane, wheelchairs, walkers, the use of special transportation or the assistance of another person to leave their place of residence. There is a normal inability to leave the home and doing so requires considerable and taxing effort. Other absences are for medical reasons / religious services and are infrequent or of short duration when for other reasons). If current dressing causes regression in wound condition, may D/C ordered dressing product/s and apply Normal Saline Moist Dressing daily until next [place] / Other MD appointment. [place] of regression in wound condition at [phone]. Please direct any NON-WOUND related issues/requests for orders to patient's Primary Care Physician Negative Pressure  Wound Therapy: Wound #2 Right Gluteus: Wound VAC  settings at 125/130 mmHg continuous pressure. Use BLACK/GREEN foam to wound cavity. Use WHITE foam to fill any tunnel/s and/or undermining. Change VAC dressing 2 X WEEK. Change canister as indicated when full. Nurse may titrate settings and frequency of dressing changes as clinically indicated. - Collegen dressing under wound [place] Nurse may d/c VAC for s/s of increased infection, significant wound regression, or uncontrolled drainage. [place] at [phone]. Apply contact layer over base of wound. Number of foam/gauze pieces used in the dressing = Place NPWT on HOLD. Other: - Re-apply NPWT beginning Monday July 2. General Notes: We will continue with the Current wound care measures for the next week and see were [place], Jillene S. ([id number]) things stand following. If anything worsen sneakily in the meantime she will contact the office for additional recommendations. Electronic Signature(s) Signed: 05/23/2017 4:47:40 PM By: Worthy Keeler PA-C Entered By: Worthy Keeler on 05/23/2017 14:48:18 Lasecki, Aviya Chauncey Cruel ([id number]) -------------------------------------------------------------------------------- SuperBill Details Patient Name: [name]. Date of Service: 05/23/2017 Medical Record Number: [id number] Patient Account Number: [id number] Date of Birth/Sex: October 10, 1966 (51 y.o. 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 F17.218 Nicotine dependence, cigarettes, with other nicotine-induced disorders I70.233 Atherosclerosis of native arteries of  right leg with ulceration of ankle M86.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, right ankle and foot Facility Procedures CPT4 Code: [id number] Description: 99213 - WOUND CARE VISIT-LEV 3 EST PT Modifier: Quantity: 1 Physician Procedures CPT4 Code Description: [id number] 20254 - WC PHYS LEVEL 3 - EST PT ICD-10 Description Diagnosis E11.621 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

## 2017-05-29 ENCOUNTER — Encounter: Payer: Medicare Other | Admitting: Physician Assistant

## 2017-05-29 DIAGNOSIS — E11621 Type 2 diabetes mellitus with foot ulcer: Secondary | ICD-10-CM | POA: Diagnosis not present

## 2017-06-02 NOTE — Progress Notes (Signed)
[name], [name] ([id number]) Visit Report for 05/29/2017 Chief Complaint Document Details Patient Name: [name], [name]. Date of Service: 05/29/2017 8:45 AM Medical Record Number: [id number] Patient Account Number: [id number] Date of Birth/Sex: February 28, 1966 (51 y.o. Female) Treating RN: Carolyne Fiscal, Debi Primary Care Provider: Delight Stare Other Clinician: Referring Provider: Delight Stare Treating Provider/Extender: Melburn Hake, HOYT Weeks in Treatment: 18 Information Obtained from: Patient Chief Complaint Patients presents for treatment of an open diabetic ulcer with an arterial etiology on her right lateral ankle and right gluteal area Electronic Signature(s) Signed: 05/29/2017 5:22:22 PM By: Worthy Keeler PA-C Entered By: Worthy Keeler on 05/29/2017 09:10:46 Kluender, [name] ([id number]) -------------------------------------------------------------------------------- HPI Details Patient Name: [name]. Date of Service: 05/29/2017 8:45 AM Medical Record Number: [id number] Patient Account Number: [id number] Date of Birth/Sex: 07/22/1966 (51 y.o. Female) Treating RN: Carolyne Fiscal, Debi Primary Care Provider: Delight Stare Other Clinician: Referring Provider: Delight Stare Treating Provider/Extender: STONE III, HOYT Weeks in Treatment: 18 History of Present Illness Location: right lateral ankle and right gluteal area Quality: Patient reports experiencing a sharp pain to affected area(s). Severity: Patient states wound are getting better slowly Duration: Patient has had the wound for > 4 months prior to seeking treatment at the wound center Timing: Pain in wound is constant (hurts all the time) Context: The wound occurred when the patient was in hospital with a necrotizing fasciitis of the right gluteal area and a ulceration on the right ankle Modifying Factors: Other treatment(s) tried include:admitted to the hospital for IV antibiotics and a full workup and has also had a recent  angiogram Associated Signs and Symptoms: Patient reports having increase discharge. HPI Description: 51 year old patient was sent to [place] from [facility] where she was seen by Dr. Sherril Cong for a left ankle ulceration and was recently hospitalized with hypotension and sepsis. She was treated with IV antibiotics and has been scheduled to see Dr. Sharol Given. She was seen by vascular surgery who recommended a femoral bypass but surgery has been delayed until her sacral wound from last year's necrotizing fasciitis has healed. Was seeing the wound care team at [facility] but wanted to change over. She is a smoker and smokes a pack of cigarettes a day The patient was recently admitted in [place] between February 2 and February 14. She had a follow- up to see vascular surgery, orthopedic surgery and infectious disease. 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 Review of the electronic medical records reveals that Dr. 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 An MRI showed edema in the bone which was consistent with osteomyelitis and the chronic ulcer and may need surgical intervention. She also had a sacral decubitus ulcer which was treated with the wound VAC. On 01/07/2017 she was taken up by the vascular surgeon Dr. Trula Slade for an abdominal aortogram and bilateral lower extremity runoff, for a history of having bilateral femoropopliteal bypass graft as well as external iliac stenting on the right and stenting of her bypass graft. She had developed a nonhealing  wound on her right ankle and there was a possibility of a femoral occlusion. the findings were  noted and the impression was a surgical revascularization with a aorto bifemoral bypass graft. Both the femoropopliteal bypass grafts were patent. 01/31/2017 -- x-ray of the right hip and pelvis -- IMPRESSION:No radiographic evidence of acute osteomyelitis. Normal-appearing right hip joint space for age. No acute bony abnormality of the hip. 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 Since seeing her last week she has not had an appointment with infectious disease or the orthopedic specialist yet. [name], [name] ([id number]) 02/06/2017 -- the patient missed a couple of appointments yesterday due to the weather but other than that has apparently given up smoking for the last 5 days. 02/13/2017 -- she has rescheduled her infectious disease appointment and also the appointment with the orthopedic surgeon Dr. Sharol Given 03/06/2017 -- she was seen by Dr. Lucianne Lei dam of infectious disease on 03/05/2017 and he recommendedIV antibiotics but the patient did not want to have that and he has given her Augmentin and doxycycline and will reevaluate her in 2 months time. 03/13/2017 -- he saw Dr. Trula Slade regarding her vascular issues and he would like to wait to the sacral wound is completely closed before considering aortobifemoral bypass graft. He will see her back in 2 months time. She was also seen by Dr. Sharol Given of orthopedics who recommended continue wound care dressings and follow in the office in about 2 months time. he also recommended 3 view radiographs of the right ankle at follow-up. 03/28/2017 -- she did have a PICC line placed and Dr. Lucianne Lei dam has begun IV vancomycin and ceftriaxone. she is going to continue this until she sees him in approximately a month's time 04/18/2017 -- we had applied for a skin substitute for the patient's care but her copayment  is going to be about $300 a piece and the patient will not be able to afford this. 05/08/17 on evaluation today patient appears to potentially have a fungal rash in the periwound region of the sacral area. This also seems to extend into the inguinal creases and factional region as well. She is tender to palpation with light rubbing over the region of the periwound and the skin in this region does have a beefy red appearance. Everything seems to point to a fungal infection at this time. She has been tolerating the dressing changes up to this point. There is no evidence of infection otherwise. 05/16/2017 -- was seen by Dr. 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 He did not think that she is making progress and was not able to examine her sacral decubitus ulcer as the patient would not allow it. She was switched to Bactrim DS 2 tablets twice a day since finishing her antibiotics. Her ESR was 76 and a CRP was 2.8. 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 05/23/17 on evaluation today patient's wound appears to be doing about the same in regard to the ankle as well as the gluteal area. She apparently has issues with the one back staying in place and she states the sill seems to break up the inferior aspect. She does have home help coming out to apply the wound VAC. She has been tolerating the dressing changes without any problem although she has had some increased pain in the gluteal region due to a  fall she sustained and she feels like she brews that area and she had pain actually radiating down her leg which is slowly beginning to improve. There's no evidence of infection. 05/29/17 Unfortunately on evaluation today patient's wounds both in regard to the ankle as well as in regard to her gluteal region on the  right appear to be measuring the same as last week's evaluation. She also tells me that she is having soreness today in regard to the right gluteal area because she had a visitor who came to see her a couple days ago and stayed for six hours and she had to set up for that entire length of time visiting. This has called some increased discomfort. Normally she does not set up for those long periods of time. Fortunately there's no evidence of infection and no worsening of the wounds. Electronic Signature(s) Signed: 05/29/2017 5:22:22 PM By: Worthy Keeler PA-C Entered By: Worthy Keeler on 05/29/2017 09:22:12 Kneeland, [name] ([id number]) -------------------------------------------------------------------------------- Physical Exam Details Patient Name: [name], [name] Date of Service: 05/29/2017 8:45 AM Medical Record Number: [id number] Patient Account Number: [id number] Date of Birth/Sex: Jul 11, 1966 (51 y.o. Female) Treating RN: Carolyne Fiscal, Debi Primary Care Provider: Delight Stare Other Clinician: Referring Provider: Delight Stare Treating Provider/Extender: STONE III, HOYT Weeks in Treatment: 73 Constitutional Well-nourished and well-hydrated in no acute distress. Respiratory normal breathing without difficulty. clear to auscultation bilaterally. Cardiovascular regular rate and rhythm with normal S1, S2. Psychiatric this patient is able to make decisions and demonstrates good insight into disease process. Alert and Oriented x 3. pleasant and cooperative. Notes Both of patient's wounds appear to have a good granular bed although the measurements are somewhat stagnant on evaluation today no sharp debridement was necessary. Electronic Signature(s) Signed: 05/29/2017 5:22:22 PM By: Worthy Keeler PA-C Entered By: Worthy Keeler on 05/29/2017 09:22:47 Younker, [name] ([id number]) -------------------------------------------------------------------------------- Physician Orders  Details Patient Name: [name]. Date of Service: 05/29/2017 8:45 AM Medical Record Number: [id number] Patient Account Number: [id number] Date of Birth/Sex: 1966-09-01 (51 y.o. 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 Wound #2 Right Gluteus o Clean wound with Normal Saline. Anesthetic Wound #1 Right,Lateral Malleolus o Topical Lidocaine 4% cream applied to wound bed prior to debridement Wound #2 Right Gluteus o Topical Lidocaine 4% cream applied to wound bed prior to debridement Primary Wound Dressing Wound #1 Right,Lateral Malleolus o Aquacel Ag Wound #2 Right Gluteus o Prisma Ag - or equal under wound vac. Secondary Dressing Wound #1 Right,Lateral Malleolus o Boardered Foam Dressing Wound #2 Right Gluteus [name], [name] ([id number]) o Boardered Foam Dressing Dressing Change Frequency Wound #1 Right,Lateral Malleolus o Change Dressing Monday, Wednesday, Friday Wound #2 Right Gluteus o Change Dressing Monday, Wednesday, Friday Follow-up Appointments Wound #1 Right,Lateral Malleolus o Return Appointment in 1 week. Wound #2 Right Gluteus o Return Appointment in 1 week. Off-Loading Wound #1  Right,Lateral  Malleolus o Turn and reposition every 2 hours Wound #2 Right Gluteus o Turn and reposition every 2 hours Home Health Wound #1 [place] Visits - Monday, Wednesday, Friday o Home Health Nurse may visit PRN to address patientos wound care needs. o FACE TO FACE ENCOUNTER: MEDICARE and MEDICAID PATIENTS: I certify that this patient is under my care and that I had a face-to-face encounter that meets the physician face-to-face encounter requirements with this patient on this date. The encounter with the patient was in whole or in part for the following MEDICAL CONDITION: (primary reason for [place]) MEDICAL NECESSITY: I certify, that based on my findings, NURSING services are a medically necessary home health service. HOME BOUND STATUS: I certify that my clinical findings support that this patient is homebound (i.e., Due to illness or injury, pt requires aid of supportive devices such as crutches, cane, wheelchairs, walkers, the use of special transportation or the assistance of another person to leave their place of residence. There is a normal inability to leave the home and doing so requires considerable and taxing effort. Other absences are for medical reasons / religious services and are infrequent or of short duration when for other reasons). o If current dressing causes regression in wound condition, may D/C ordered dressing product/s and apply Normal Saline Moist Dressing daily until next [place] / Other MD appointment. [place] of regression in wound condition at [phone]. o Please direct any NON-WOUND related issues/requests for orders to patient's Primary Care Physician Wound #2 Right [place] Visits - Monday, Wednesday, Friday o Home Health Nurse may visit PRN to address patientos wound care needs. [name], [name] ([id number]) o FACE TO FACE  ENCOUNTER: MEDICARE and MEDICAID PATIENTS: I certify that this patient is under my care and that I had a face-to-face encounter that meets the physician face-to-face encounter requirements with this patient on this date. The encounter with the patient was in whole or in part for the following MEDICAL CONDITION: (primary reason for [place]) MEDICAL NECESSITY: I certify, that based on my findings, NURSING services are a medically necessary home health service. HOME BOUND STATUS: I certify that my clinical findings support that this patient is homebound (i.e., Due to illness or injury, pt requires aid of supportive devices such as crutches, cane, wheelchairs, walkers, the use of special transportation or the assistance of another person to leave their place of residence. There is a normal inability to leave the home and doing so requires considerable and taxing effort. Other absences are for medical reasons / religious services and are infrequent or of short duration when for other reasons). o If current dressing causes regression in wound condition, may D/C ordered dressing product/s and apply Normal Saline Moist Dressing daily until next [place] / Other MD appointment. [place] of regression in wound condition at [phone]. o Please direct any NON-WOUND related issues/requests for orders to patient's Primary Care Physician Negative Pressure Wound Therapy Wound #2 Right Gluteus o Wound VAC settings at 125/130 mmHg continuous pressure. Use BLACK/GREEN foam to wound cavity. Use WHITE foam to fill any tunnel/s and/or undermining. Change VAC dressing 2 X WEEK. Change canister as indicated when full. Nurse may titrate settings and frequency of dressing changes as clinically indicated. - Collegen dressing under wound Gantt Nurse may d/c VAC for s/s of increased infection, significant wound regression, or uncontrolled drainage. Notify  Wound Healing  Center at [phone]. o Apply contact layer over base of wound. o Number of foam/gauze pieces used in the dressing = o Place NPWT on HOLD. Electronic Signature(s) Signed: 05/29/2017 4:57:10 PM By: Alric Quan Signed: 05/29/2017 5:22:22 PM By: Worthy Keeler PA-C Previous Signature: 05/29/2017 4:28:11 PM Version By: Alric Quan Entered By: Alric Quan on 05/29/2017 16:52:02 [place], [place]. ([id number]) -------------------------------------------------------------------------------- Problem List Details Patient Name: DANNON, PERLOW. Date of Service: 05/29/2017 8:45 AM Medical Record Number: [id number] Patient Account Number: [id number] Date of Birth/Sex: November 27, 1965 (51 y.o. Female) Treating RN: Carolyne Fiscal, Debi Primary Care Provider: Delight Stare Other Clinician: Referring Provider: Delight Stare Treating Provider/Extender: Melburn Hake, HOYT Weeks in Treatment: 18 Active Problems ICD-10 Encounter Code Description Active Date Diagnosis E11.621 Type 2 diabetes mellitus with foot ulcer 01/23/2017 Yes L89.314 Pressure ulcer of right buttock, stage 4 01/23/2017 Yes L97.312 Non-pressure chronic ulcer of right ankle with fat layer 01/23/2017 Yes exposed F17.218 Nicotine dependence, cigarettes, with other nicotine- 01/23/2017 Yes induced disorders I70.233 Atherosclerosis of native arteries of right leg with 01/23/2017 Yes ulceration of ankle M86.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, right ankle and foot 01/23/2017 Yes Inactive Problems Resolved Problems Electronic Signature(s) Signed: 05/29/2017 5:22:22 PM By: Worthy Keeler PA-C Entered By: Worthy Keeler on 05/29/2017 09:10:34 [name], [name] ([id number]) -------------------------------------------------------------------------------- Progress Note Details Patient Name: [name]. Date of Service: 05/29/2017 8:45 AM Medical Record Number: [id number] Patient Account Number: [id number] Date of Birth/Sex:  [birth year] (51 y.o. Female) Treating RN: Carolyne Fiscal, Debi Primary Care Provider: Delight Stare Other Clinician: Referring Provider: Delight Stare Treating Provider/Extender: Melburn Hake, HOYT Weeks in Treatment: 18 Subjective Chief Complaint Information obtained from Patient Patients presents for treatment of an open diabetic ulcer with an arterial etiology on her right lateral ankle and right gluteal area History of Present Illness (HPI) The following HPI elements were documented for the patient's wound: Location: right lateral ankle and right gluteal area Quality: Patient reports experiencing a sharp pain to affected area(s). Severity: Patient states wound are getting better slowly Duration: Patient has had the wound for > 4 months prior to seeking treatment at the wound center Timing: Pain in wound is constant (hurts all the time) Context: The wound occurred when the patient was in hospital with a necrotizing fasciitis of the right gluteal area and a ulceration on the right ankle Modifying Factors: Other treatment(s) tried include:admitted to the hospital for IV antibiotics and a full workup and has also had a recent angiogram Associated Signs and Symptoms: Patient reports having increase discharge. 51 year old patient was sent to [place] from [facility] where she was seen by Dr. Sherril Cong for a left ankle ulceration and was recently hospitalized with hypotension and sepsis. She was treated with IV antibiotics and has been scheduled to see Dr. Sharol Given. She was seen by vascular surgery who recommended a femoral bypass but surgery has been delayed until her sacral wound from last year's necrotizing fasciitis has healed. Was seeing the wound care team at [facility] but wanted to change over. She is a smoker and smokes a pack of cigarettes a day The patient was recently admitted in [place] between February 2 and February 14. She had a follow- up to see vascular surgery, orthopedic surgery  and infectious disease. During her admission she was known to have peripheral arterial disease, diabetes mellitus with neuropathy, chronic pain, open wound with necrotizing fasciitis of the sacral area which had been there since November 2017 Past medical history significant for diabetes mellitus, ankle ulcer,  sacral ulcer, necrotizing fasciitis, arterial occlusive disease, tobacco abuse. Review of the electronic medical records reveals that Dr. 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 An MRI showed edema in the bone which was consistent with osteomyelitis and the chronic ulcer and may need surgical intervention. She also had a sacral decubitus ulcer which was treated with the wound VAC. On 01/07/2017 she was taken up by the vascular surgeon Dr. Trula Slade for an abdominal aortogram and bilateral lower extremity runoff, for a history of having bilateral femoropopliteal bypass graft as well as external iliac stenting on the right and stenting of her bypass graft. She had developed a nonhealing wound on her right ankle and there was a possibility of a femoral occlusion. the findings were noted and the [name], BEAM. ([id number]) impression was a surgical revascularization with a aorto bifemoral bypass graft. Both the femoropopliteal bypass grafts were patent. 01/31/2017 -- x-ray of the right hip and pelvis -- IMPRESSION:No radiographic evidence of acute osteomyelitis. Normal-appearing right hip joint space for age. No acute bony abnormality of the hip. 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 Since seeing her last week she has not had an appointment with infectious disease or the orthopedic specialist yet. 02/06/2017 -- the patient missed a couple of appointments yesterday due to the weather but other than that has apparently  given up smoking for the last 5 days. 02/13/2017 -- she has rescheduled her infectious disease appointment and also the appointment with the orthopedic surgeon Dr. Sharol Given 03/06/2017 -- she was seen by Dr. Lucianne Lei dam of infectious disease on 03/05/2017 and he recommendedIV antibiotics but the patient did not want to have that and he has given her Augmentin and doxycycline and will reevaluate her in 2 months time. 03/13/2017 -- he saw Dr. Trula Slade regarding her vascular issues and he would like to wait to the sacral wound is completely closed before considering aortobifemoral bypass graft. He will see her back in 2 months time. She was also seen by Dr. Sharol Given of orthopedics who recommended continue wound care dressings and follow in the office in about 2 months time. he also recommended 3 view radiographs of the right ankle at follow-up. 03/28/2017 -- she did have a PICC line placed and Dr. Lucianne Lei dam has begun IV vancomycin and ceftriaxone. she is going to continue this until she sees him in approximately a month's time 04/18/2017 -- we had applied for a skin substitute for the patient's care but her copayment is going to be about $300 a piece and the patient will not be able to afford this. 05/08/17 on evaluation today patient appears to potentially have a fungal rash in the periwound region of the sacral area. This also seems to extend into the inguinal creases and factional region as well. She is tender to palpation with light rubbing over the region of the periwound and the skin in this region does have a beefy red appearance. Everything seems to point to a fungal infection at this time. She has been tolerating the dressing changes up to this point. There is no evidence of infection otherwise. 05/16/2017 -- was seen by Dr. 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 He did not think that she is making progress  and  was not able to examine her sacral decubitus ulcer as the patient would not allow it. She was switched to Bactrim DS 2 tablets twice a day since finishing her antibiotics. Her ESR was 76 and a CRP was 2.8. 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 05/23/17 on evaluation today patient's wound appears to be doing about the same in regard to the ankle as well as the gluteal area. She apparently has issues with the one back staying in place and she states the sill seems to break up the inferior aspect. She does have home help coming out to apply the wound VAC. She has been tolerating the dressing changes without any problem although she has had some increased pain in the gluteal region due to a fall she sustained and she feels like she brews that area and she had pain actually radiating down her leg which is slowly beginning to improve. There's no evidence of infection. 05/29/17 Unfortunately on evaluation today patient's wounds both in regard to the ankle as well as in regard to her gluteal region on the right appear to be measuring the same as last week's evaluation. She also tells me that she is having soreness today in regard to the right gluteal area because she had a visitor who came to see her a couple days ago and stayed for six hours and she had to set up for that entire length of time [name], [name]. ([id number]) visiting. This has called some increased discomfort. Normally she does not set up for those long periods of time. Fortunately there's no evidence of infection and no worsening of the wounds. Objective Constitutional Well-nourished and well-hydrated in no acute distress. Vitals Time Taken: 8:54 AM, Height: 67 in, Weight: 137 lbs, BMI: 21.5, Temperature: 97.6 F, Pulse: 98 bpm, Respiratory Rate: 18 breaths/min, Blood Pressure: 72/52 mmHg. Respiratory normal breathing without  difficulty. clear to auscultation bilaterally. Cardiovascular regular rate and rhythm with normal S1, S2. Psychiatric this patient is able to make decisions and demonstrates good insight into disease process. Alert and Oriented x 3. pleasant and cooperative. General Notes: Both of patient's wounds appear to have a good granular bed although the measurements are somewhat stagnant on evaluation today no sharp debridement was necessary. Integumentary (Hair, Skin) Wound #1 status is Open. Original cause of wound was Gradually Appeared. The wound is located on the Right,Lateral Malleolus. The wound measures 1.1cm length x 1.3cm width x 0.2cm depth; 1.123cm^2 area and 0.225cm^3 volume. There is Fat Layer (Subcutaneous Tissue) Exposed exposed. There is no tunneling or undermining noted. There is a large amount of purulent drainage noted. The wound margin is distinct with the outline attached to the wound base. There is large (67-100%) pink granulation within the wound bed. There is a small (1-33%) amount of necrotic tissue within the wound bed including Adherent Slough. The periwound skin appearance exhibited: Induration, Maceration. The periwound skin appearance did not exhibit: Callus, Crepitus, Excoriation, Rash, Scarring, Dry/Scaly, Atrophie Blanche, Cyanosis, Ecchymosis, Hemosiderin Staining, Mottled, Pallor, Rubor, Erythema. Periwound temperature was noted as No Abnormality. The periwound has tenderness on palpation. Wound #2 status is Open. Original cause of wound was Gradually Appeared. The wound is located on the Right Gluteus. The wound measures 2cm length x 1.4cm width x 1.7cm depth; 2.199cm^2 area and 3.738cm^3 volume. There is Fat Layer (Subcutaneous Tissue) Exposed exposed. There is no tunneling noted, however, there is  undermining starting at 3:00 and ending at 9:00 with a maximum distance of Sopher, Nickey S. ([id number]) 0.5cm. There is a large amount of serous drainage noted. The  wound margin is distinct with the outline attached to the wound base. There is medium (34-66%) red, pink granulation within the wound bed. There is a medium (34-66%) amount of necrotic tissue within the wound bed including Adherent Slough. The periwound skin appearance exhibited: Maceration, Ecchymosis. Periwound temperature was noted as No Abnormality. The periwound has tenderness on palpation. 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 Wound #2 Right Gluteus: Clean wound with Normal Saline. Anesthetic: Wound #1 Right,Lateral Malleolus: Topical Lidocaine 4% cream applied to wound bed prior to debridement Wound #2 Right Gluteus: Topical Lidocaine 4% cream applied to wound bed prior to debridement Primary Wound Dressing: Wound #1 Right,Lateral Malleolus: Aquacel Ag Wound #2 Right Gluteus: Prisma Ag - or equal under wound vac. Secondary Dressing: Wound #1 Right,Lateral Malleolus: Boardered Foam Dressing Wound #2 Right Gluteus: Boardered Foam Dressing Dressing Change Frequency: Wound #1 Right,Lateral MalleolusKEIR, [name] ([id number]) Change Dressing Monday, Wednesday, Friday Wound #2 Right Gluteus: Change Dressing Monday, Wednesday, Friday Follow-up Appointments: Wound #1 Right,Lateral Malleolus: Return Appointment in 1 week. Wound #2 Right Gluteus: Return Appointment in 1 week. Off-Loading: Wound #1 Right,Lateral Malleolus: Turn and reposition every 2 hours Wound #2 Right Gluteus: Turn  and reposition every 2 hours Home Health: Wound #1 Right,Lateral Malleolus: [facility] Visits - Monday, Wednesday, Friday [place] Nurse may visit PRN to address patient s wound care needs. FACE TO FACE ENCOUNTER: MEDICARE and MEDICAID PATIENTS: I certify that this patient is under my care and that I had a face-to-face encounter that meets the physician face-to-face encounter requirements with this patient on this date. The encounter with the patient was in whole or in part for the following MEDICAL CONDITION: (primary reason for [place]) MEDICAL NECESSITY: I certify, that based on my findings, NURSING services are a medically necessary home health service. HOME BOUND STATUS: I certify that my clinical findings support that this patient is homebound (i.e., Due to illness or injury, pt requires aid of supportive devices such as crutches, cane, wheelchairs, walkers, the use of special transportation or the assistance of another person to leave their place of residence. There is a normal inability to leave the home and doing so requires considerable and taxing effort. Other absences are for medical reasons / religious services and are infrequent or of short duration when for other reasons). If current dressing causes regression in wound condition, may D/C ordered dressing product/s and apply Normal Saline Moist Dressing daily until next [place] / Other MD appointment. [place] of regression in wound condition at [phone]. Please direct any NON-WOUND related issues/requests for orders to patient's Primary Care Physician Wound #2 Right Gluteus: [place] Visits - Monday, Wednesday, Friday Home Health Nurse may visit PRN to address patient s wound care needs. FACE TO FACE ENCOUNTER: MEDICARE and MEDICAID PATIENTS: I certify that this patient is under my care and that I had a face-to-face  encounter that meets the physician  face-to-face encounter requirements with this patient on this date. The encounter with the patient was in whole or in part for the following MEDICAL CONDITION: (primary reason for [place]) MEDICAL NECESSITY: I certify, that based on my findings, NURSING services are a medically necessary home health service. HOME BOUND STATUS: I certify that my clinical findings support that this patient is homebound (i.e., Due to illness or injury, pt requires aid of supportive devices such as crutches, cane, wheelchairs, walkers, the use of special transportation or the assistance of another person to leave their place of residence. There is a normal inability to leave the home and doing so requires considerable and taxing effort. Other absences are for medical reasons / religious services and are infrequent or of short duration when for other reasons). If current dressing causes regression in wound condition, may D/C ordered dressing product/s and apply Normal Saline Moist Dressing daily until next [facility] / Other MD appointment. [place] of regression in wound condition at [phone]. Please direct any NON-WOUND related issues/requests for orders to patient's Primary Care Physician Negative Pressure Wound Therapy: Wound #2 Right Gluteus: Wound VAC settings at 125/130 mmHg continuous pressure. Use BLACK/GREEN foam to wound cavity. Muhl, Liara S. ([id number]) Use WHITE foam to fill any tunnel/s and/or undermining. Change VAC dressing 2 X WEEK. Change canister as indicated when full. Nurse may titrate settings and frequency of dressing changes as clinically indicated. - Collegen dressing under wound [place] Nurse may d/c VAC for s/s of increased infection, significant wound regression, or uncontrolled drainage. [place] at [phone]. Apply contact layer over base of wound. Number of foam/gauze pieces used in the dressing = Place NPWT on  HOLD. Other: - Re-apply NPWT beginning Monday July 2. I'm going to recommend that we continue with the current wound care orders in regard to the gluteal region for the next week. This will mean continuing the wound VAC. However I am going to switch her dressings for the ankle wound to a silver alginate dressing due to the significant amount of drainage that she is experiencing. We will see her for reevaluation in one week. Electronic Signature(s) Signed: 05/29/2017 5:22:22 PM By: Worthy Keeler PA-C Entered By: Worthy Keeler on 05/29/2017 09:24:32 [place], [name] ([id number]) -------------------------------------------------------------------------------- SuperBill Details Patient Name: [name]. Date of Service: 05/29/2017 Medical Record Number: [id number] Patient Account Number: [id number] Date of Birth/Sex: October 04, 1966 (51 y.o. 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, right ankle and foot Facility Procedures CPT4 Code: [id number] Description: 99214 - WOUND CARE VISIT-LEV 4 EST PT Modifier: Quantity: 1 Physician Procedures CPT4 Code Description: [id number] - WC PHYS LEVEL 3 - EST PT ICD-10 Description Diagnosis E11.621 Type 2 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, stage 4 L97.312 Non-pressure chronic ulcer of right ankle with fat la  F17.218 Nicotine dependence, cigarettes,  with other nicotine- Modifier: yer exposed induced diso Quantity: 1 rders Electronic Signature(s) Signed: 05/30/2017 6:13:19 PM By: Melburn Hake,  Margarita Grizzle PA-C Previous Signature: 05/29/2017 5:22:22 PM Version By: Worthy Keeler PA-C Entered By: Sharon Mt on 05/30/2017 15:58:41

## 2017-06-02 NOTE — Progress Notes (Addendum)
Jamie, [name] ([id number]) Visit Report for 05/29/2017 Arrival Information Details Patient Name: [name], [name]. Date of Service: 05/29/2017 8:45 AM Medical Record Number: [id number] Patient Account Number: [id number] Date of Birth/Sex: 1965/12/14 (50 y.o. Female) Treating RN: Carolyne Fiscal, Debi Primary Care Aerith Canal: Delight Stare Other Clinician: Referring Noble Cicalese: Delight Stare Treating Ian Castagna/Extender: Melburn Hake, HOYT Weeks in Treatment: 18 Visit Information History Since Last Visit All ordered tests and consults were completed: No Patient Arrived: [name] Added or deleted any medications: No Arrival Time: 08:53 Any new allergies or adverse reactions: No Accompanied By: self Had a fall or experienced change in No Transfer Assistance: None activities of daily living that may affect Patient Identification Verified: Yes risk of falls: Secondary Verification Process Completed: Yes Signs or symptoms of abuse/neglect since last No Patient Requires Transmission-Based No visito Precautions: Hospitalized since last visit: No Patient Has Alerts: No Has Dressing in Place as Prescribed: Yes Pain Present Now: Yes Electronic Signature(s) Signed: 05/29/2017 4:28:11 PM By: Alric Quan Entered By: Alric Quan on 05/29/2017 08:54:02 Skates, Norman S. ([id number]) -------------------------------------------------------------------------------- Clinic Level of Care Assessment Details Patient Name: [name]. Date of Service: 05/29/2017 8:45 AM Medical Record Number: [id number] Patient Account Number: [id number] Date of Birth/Sex: [birth year] (51 y.o. Female) Treating RN: Carolyne Fiscal, Debi Primary Care Rayonna Heldman: Lacross, LINDA Other Clinician: Referring Enyah Moman: Delight Stare Treating Fredrico Beedle/Extender: Melburn Hake, HOYT Weeks in Treatment: 18 Clinic Level of Care Assessment Items TOOL 4 Quantity Score X - Use when only an EandM is performed on FOLLOW-UP visit 1 0 ASSESSMENTS -  Nursing Assessment / Reassessment X - Reassessment of Co-morbidities (includes updates in patient status) 1 10 X - Reassessment of Adherence to Treatment Plan 1 5 ASSESSMENTS - Wound and Skin Assessment / Reassessment []  - Simple Wound Assessment / Reassessment - one wound 0 X - Complex Wound Assessment / Reassessment - multiple wounds 2 5 []  - Dermatologic / Skin Assessment (not related to wound area) 0 ASSESSMENTS - Focused Assessment []  - Circumferential Edema Measurements - multi extremities 0 []  - Nutritional Assessment / Counseling / Intervention 0 []  - Lower Extremity Assessment (monofilament, tuning fork, pulses) 0 []  - Peripheral Arterial Disease Assessment (using hand held doppler) 0 ASSESSMENTS - Ostomy and/or Continence Assessment and Care []  - Incontinence Assessment and Management 0 []  - Ostomy Care Assessment and Management (repouching, etc.) 0 PROCESS - Coordination of Care []  - Simple Patient / Family Education for ongoing care 0 X - Complex (extensive) Patient / Family Education for ongoing care 1 20 X - Staff obtains Programmer, systems, Records, Test Results / Process Orders 1 10 X - Staff telephones HHA, Nursing Homes / Clarify orders / etc 1 10 []  - Routine Transfer to another Facility (non-emergent condition) 0 Jamie, Kelci S. ([id number]) []  - Routine Hospital Admission (non-emergent condition) 0 []  - New Admissions / Biomedical engineer / Ordering NPWT, Apligraf, etc. 0 []  - Emergency Hospital Admission (emergent condition) 0 X - Simple Discharge Coordination 1 10 []  - Complex (extensive) Discharge Coordination 0 PROCESS - Special Needs []  - Pediatric / Minor Patient Management 0 []  - Isolation Patient Management 0 []  - Hearing / Language / Visual special needs 0 []  - Assessment of Community assistance (transportation, D/C planning, etc.) 0 []  - Additional assistance / Altered mentation 0 []  - Support Surface(s) Assessment (bed, cushion, seat, etc.)  0 INTERVENTIONS - Wound Cleansing / Measurement []  - Simple Wound Cleansing - one wound 0 X - Complex Wound Cleansing - multiple wounds 2 5 []  -  Wound Imaging (photographs - any number of wounds) 0 []  - Wound Tracing (instead of photographs) 0 []  - Simple Wound Measurement - one wound 0 X - Complex Wound Measurement - multiple wounds 2 5 INTERVENTIONS - Wound Dressings X - Small Wound Dressing one or multiple wounds 1 10 X - Medium Wound Dressing one or multiple wounds 1 15 []  - Large Wound Dressing one or multiple wounds 0 X - Application of Medications - topical 1 5 []  - Application of Medications - injection 0 INTERVENTIONS - Miscellaneous []  - External ear exam 0 Jamie, Daniele S. ([id number]) []  - Specimen Collection (cultures, biopsies, blood, body fluids, etc.) 0 []  - Specimen(s) / Culture(s) sent or taken to Lab for analysis 0 []  - Patient Transfer (multiple staff / Harrel Lemon Lift / Similar devices) 0 []  - Simple Staple / Suture removal (25 or less) 0 []  - Complex Staple / Suture removal (26 or more) 0 []  - Hypo / Hyperglycemic Management (close monitor of Blood Glucose) 0 []  - Ankle / Brachial Index (ABI) - do not check if billed separately 0 X - Vital Signs 1 5 Has the patient been seen at the hospital within the last three years: Yes Total Score: 130 Level Of Care: New/Established - Level 4 Electronic Signature(s) Signed: 05/29/2017 4:28:11 PM By: Alric Quan Entered By: Alric Quan on 05/29/2017 11:44:07 [name], [name] ([id number]) -------------------------------------------------------------------------------- Encounter Discharge Information Details Patient Name: [name]. Date of Service: 05/29/2017 8:45 AM Medical Record Number: [id number] Patient Account Number: [id number] Date of Birth/Sex: 1966/03/24 (51 y.o. Female) Treating RN: Carolyne Fiscal, Debi Primary Care Alverta Caccamo: Delight Stare Other Clinician: Referring Austin Herd: Delight Stare Treating  Willean Schurman/Extender: Melburn Hake, HOYT Weeks in Treatment: 18 Encounter Discharge Information Items Discharge Pain Level: 8 Discharge Condition: Stable Ambulatory Status: [name] Discharge Destination: Home Private Transportation: Auto Accompanied By: self Schedule Follow-up Appointment: Yes Medication Reconciliation completed and No provided to Patient/Care Tykerria Mccubbins: Patient Clinical Summary of Care: Declined Electronic Signature(s) Signed: 05/29/2017 9:25:25 AM By: Ruthine Dose Entered By: Ruthine Dose on 05/29/2017 09:25:25 Dial, Lovelee Chauncey Cruel ([id number]) -------------------------------------------------------------------------------- Lower Extremity Assessment Details Patient Name: [name]. Date of Service: 05/29/2017 8:45 AM Medical Record Number: [id number] Patient Account Number: [id number] Date of Birth/Sex: 05/06/1966 (51 y.o. Female) Treating RN: Carolyne Fiscal, Debi Primary Care Darrin Koman: Delight Stare Other Clinician: Referring Shanae Luo: Delight Stare Treating Stuart Guillen/Extender: STONE III, HOYT Weeks in Treatment: 18 Vascular Assessment Pulses: Dorsalis Pedis Palpable: [Right:Yes] Posterior Tibial Extremity colors, hair growth, and conditions: Extremity Color: [Right:Normal] Temperature of Extremity: [Right:Warm] Capillary Refill: [Right:< 3 seconds] Toe Nail Assessment Left: Right: Thick: Yes Discolored: Yes Deformed: Yes Improper Length and Hygiene: Yes Electronic Signature(s) Signed: 05/29/2017 4:28:11 PM By: Alric Quan Entered By: Alric Quan on 05/29/2017 09:02:26 Lempke, Annalycia S. ([id number]) -------------------------------------------------------------------------------- Multi Wound Chart Details Patient Name: [name]. Date of Service: 05/29/2017 8:45 AM Medical Record Number: [id number] Patient Account Number: [id number] Date of Birth/Sex: 1966/08/08 (51 y.o. Female) Treating RN: Carolyne Fiscal, Debi Primary Care Lateef Juncaj: Delight Stare Other Clinician: Referring Davan Hark: Delight Stare Treating Ayslin Kundert/Extender: STONE III, HOYT Weeks in Treatment: 18 Vital Signs Height(in): 67 Pulse(bpm): 98 Weight(lbs): 137 Blood Pressure 72/52 (mmHg): Body Mass Index(BMI): 21 Temperature(F): 97.6 Respiratory Rate 18 (breaths/min): Photos: [1:No Photos] [2:No Photos] [N/A:N/A] Wound Location: [1:Right Malleolus - Lateral] [2:Right Gluteus] [N/A:N/A] Wounding Event: [1:Gradually Appeared] [2:Gradually Appeared] [N/A:N/A] Primary Etiology: [1:Diabetic Wound/Ulcer of the Lower Extremity] [2:Open Surgical Wound] [N/A:N/A] Comorbid History: [1:Anemia, Arrhythmia, Coronary Artery Disease, Hypotension, Peripheral Arterial Disease, Type II Diabetes,  Osteoarthritis] [2:Anemia, Arrhythmia, Coronary Artery Disease, Hypotension, Peripheral Arterial Disease, Type II Diabetes,  Osteoarthritis] [N/A:N/A] Date Acquired: [1:09/23/2016] [2:09/18/2016] [N/A:N/A] Weeks of Treatment: [1:18] [2:18] [N/A:N/A] Wound Status: [1:Open] [2:Open] [N/A:N/A] Measurements L x W x D 1.1x1.3x0.2 [2:2x1.4x1.7] [N/A:N/A] (cm) Area (cm) : [1:1.123] [2:2.199] [N/A:N/A] Volume (cm) : [1:0.225] [2:3.738] [N/A:N/A] % Reduction in Area: [1:71.40%] [2:88.60%] [N/A:N/A] % Reduction in Volume: 88.50% [2:96.10%] [N/A:N/A] Starting Position 1 [2:3] (o'clock): Ending Position 1 [2:9] (o'clock): Maximum Distance 1 [2:0.5] (cm): Undermining: [1:No] [2:Yes] [N/A:N/A] Classification: [1:Grade 2] [2:Full Thickness Without Exposed Support Structures] [N/A:N/A] Exudate Amount: [1:Large] [2:Large] [N/A:N/A] Exudate Type: Purulent Serous N/A Exudate Color: yellow, brown, green amber N/A Wound Margin: Distinct, outline attached Distinct, outline attached N/A Granulation Amount: Large (67-100%) Medium (34-66%) N/A Granulation Quality: Pink Red, Pink N/A Necrotic Amount: Small (1-33%) Medium (34-66%) N/A Exposed Structures: Fat Layer (Subcutaneous Fat Layer  (Subcutaneous N/A Tissue) Exposed: Yes Tissue) Exposed: Yes Fascia: No Fascia: No Tendon: No Tendon: No Muscle: No Muscle: No Joint: No Joint: No Bone: No Bone: No Epithelialization: None None N/A Periwound Skin Texture: Induration: Yes No Abnormalities Noted N/A Excoriation: No Callus: No Crepitus: No Rash: No Scarring: No Periwound Skin Maceration: Yes Maceration: Yes N/A Moisture: Dry/Scaly: No Periwound Skin Color: Atrophie Blanche: No Ecchymosis: Yes N/A Cyanosis: No Ecchymosis: No Erythema: No Hemosiderin Staining: No Mottled: No Pallor: No Rubor: No Temperature: No Abnormality No Abnormality N/A Tenderness on Yes Yes N/A Palpation: Wound Preparation: Ulcer Cleansing: Ulcer Cleansing: N/A Rinsed/Irrigated with Rinsed/Irrigated with Saline Saline Topical Anesthetic Topical Anesthetic Applied: Other: lidocaine Applied: Other: lidocaine 4% 4% Treatment Notes Electronic Signature(s) Signed: 05/29/2017 4:28:11 PM By: Alric Quan Entered By: Alric Quan on 05/29/2017 09:02:38 Marcella, [name] ([id number]) -------------------------------------------------------------------------------- [place] Details Patient Name: JAXSYN, CATALFAMO. Date of Service: 05/29/2017 8:45 AM Medical Record Number: [id number] Patient Account Number: [id number] Date of Birth/Sex: [birth year] (51 y.o. Female) Treating RN: Carolyne Fiscal, Debi Primary Care Phil Corti: Delight Stare Other Clinician: Referring Curry Seefeldt: Delight Stare Treating Minha Fulco/Extender: Melburn Hake, HOYT Weeks in Treatment: 18 Active Inactive ` Orientation to the Wound Care Program Nursing Diagnoses: Knowledge deficit related to the wound healing center program Goals: Patient/caregiver will verbalize understanding of the [facility] Date Initiated: 01/23/2017 Target Resolution Date: 05/25/2017 Goal Status: Active Interventions: Provide education on orientation to the wound  center Notes: ` Peripheral Neuropathy Nursing Diagnoses: Knowledge deficit related to disease process and management of peripheral neurovascular dysfunction Potential alteration in peripheral tissue perfusion (select prior to confirmation of diagnosis) Goals: Patient/caregiver will verbalize understanding of disease process and disease management Date Initiated: 01/23/2017 Target Resolution Date: 05/25/2017 Goal Status: Active Interventions: Assess signs and symptoms of neuropathy upon admission and as needed Provide education on Management of Neuropathy and Related Ulcers Provide education on Management of Neuropathy upon discharge from the [facility] Activities: Test ordered outside of clinic : 01/23/2017 Notes: NASHAY, BRICKLEY ([id number]) ` Pressure Nursing Diagnoses: Knowledge deficit related to causes and risk factors for pressure ulcer development Knowledge deficit related to management of pressures ulcers Potential for impaired tissue integrity related to pressure, friction, moisture, and shear Goals: Patient will remain free from development of additional pressure ulcers Date Initiated: 01/23/2017 Target Resolution Date: 05/25/2017 Goal Status: Active Patient will remain free of pressure ulcers Date Initiated: 01/23/2017 Target Resolution Date: 05/25/2017 Goal Status: Active Patient/caregiver will verbalize risk factors for pressure ulcer development Date Initiated: 01/23/2017 Target Resolution Date: 05/25/2017 Goal Status: Active Patient/caregiver will verbalize understanding of pressure ulcer management  Date Initiated: 01/23/2017 Target Resolution Date: 05/25/2017 Goal Status: Active Interventions: Assess: immobility, friction, shearing, incontinence upon admission and as needed Assess offloading mechanisms upon admission and as needed Assess potential for pressure ulcer upon admission and as needed Provide education on pressure ulcers Treatment Activities: Patient  referred for pressure reduction/relief devices : 01/23/2017 Patient referred for seating evaluation to ensure proper offloading : 01/23/2017 Pressure reduction/relief device ordered : 01/23/2017 Notes: ` Wound/Skin Impairment Nursing Diagnoses: Impaired tissue integrity Knowledge deficit related to smoking impact on wound healing Knowledge deficit related to ulceration/compromised skin integrity Goals: Patient/caregiver will verbalize understanding of skin care regimen MARLITA, KEIL ([id number]) Date Initiated: 01/23/2017 Target Resolution Date: 05/25/2017 Goal Status: Active Ulcer/skin breakdown will have a volume reduction of 30% by week 4 Date Initiated: 01/23/2017 Target Resolution Date: 05/25/2017 Goal Status: Active Ulcer/skin breakdown will have a volume reduction of 50% by week 8 Date Initiated: 01/23/2017 Target Resolution Date: 05/25/2017 Goal Status: Active Ulcer/skin breakdown will have a volume reduction of 80% by week 12 Date Initiated: 01/23/2017 Target Resolution Date: 05/25/2017 Goal Status: Active Ulcer/skin breakdown will heal within 14 weeks Date Initiated: 01/23/2017 Target Resolution Date: 05/25/2017 Goal Status: Active Interventions: Assess patient/caregiver ability to obtain necessary supplies Assess patient/caregiver ability to perform ulcer/skin care regimen upon admission and as needed Assess ulceration(s) every visit Provide education on smoking Provide education on ulcer and skin care Treatment Activities: Patient referred to home care : 01/23/2017 Referred to DME Dajah Fischman for dressing supplies : 01/23/2017 Skin care regimen initiated : 01/23/2017 Topical wound management initiated : 01/23/2017 Notes: Electronic Signature(s) Signed: 05/29/2017 4:28:11 PM By: Alric Quan Entered By: Alric Quan on 05/29/2017 09:02:30 Marczak, [name] ([id number]) -------------------------------------------------------------------------------- Pain Assessment Details Patient Name:  [name]. Date of Service: 05/29/2017 8:45 AM Medical Record Number: [id number] Patient Account Number: [id number] Date of Birth/Sex: December 12, 1965 (51 y.o. Female) Treating RN: Carolyne Fiscal, Debi Primary Care Fredric Slabach: Delight Stare Other Clinician: Referring Josmar Messimer: Delight Stare Treating Kahil Agner/Extender: STONE III, HOYT Weeks in Treatment: 18 Active Problems Location of Pain Severity and Description of Pain Patient Has Paino Yes Site Locations Rate the pain. Current Pain Level: 9 Character of Pain Describe the Pain: Aching Pain Management and Medication Current Pain Management: Electronic Signature(s) Signed: 05/29/2017 4:28:11 PM By: Alric Quan Entered By: Alric Quan on 05/29/2017 08:54:14 Postell, Ivonna Chauncey Cruel ([id number]) -------------------------------------------------------------------------------- Patient/Caregiver Education Details Patient Name: [name]. Date of Service: 05/29/2017 8:45 AM Medical Record Number: [id number] Patient Account Number: [id number] Date of Birth/Gender: 11-Mar-1966 (51 y.o. Female) Treating RN: Carolyne Fiscal, Debi Primary Care Physician: Delight Stare Other Clinician: Referring Physician: Delight Stare Treating Physician/Extender: Worthy Keeler Weeks in Treatment: 18 Education Assessment Education Provided To: Patient Education Topics Provided Wound/Skin Impairment: Handouts: Other: change dressing as ordered Methods: Demonstration, Explain/Verbal Responses: State content correctly Electronic Signature(s) Signed: 05/29/2017 4:28:11 PM By: Alric Quan Entered By: Alric Quan on 05/29/2017 09:13:39 Bolon, Toshika S. ([id number]) -------------------------------------------------------------------------------- Wound Assessment Details Patient Name: [name]. Date of Service: 05/29/2017 8:45 AM Medical Record Number: [id number] Patient Account Number: [id number] Date of Birth/Sex: [birth year] (51 y.o.  Female) Treating RN: Carolyne Fiscal, Debi Primary Care Takila Kronberg: Delight Stare Other Clinician: Referring Jennife Zaucha: Delight Stare Treating Grae Leathers/Extender: STONE III, HOYT Weeks in Treatment: 18 Wound Status Wound Number: 1 Primary Diabetic Wound/Ulcer of the Lower Etiology: Extremity Wound Location: Right Malleolus - Lateral Wound Open Wounding Event: Gradually Appeared Status: Date Acquired: 09/23/2016 Comorbid Anemia, Arrhythmia, Coronary Artery Weeks Of Treatment: 18 History: Disease, Hypotension, Peripheral Clustered Wound: No  Arterial Disease, Type II Diabetes, Osteoarthritis Photos Photo Uploaded By: Alric Quan on 05/29/2017 11:12:11 Wound Measurements Length: (cm) 1.1 Width: (cm) 1.3 Depth: (cm) 0.2 Area: (cm) 1.123 Volume: (cm) 0.225 % Reduction in Area: 71.4% % Reduction in Volume: 88.5% Epithelialization: None Tunneling: No Undermining: No Wound Description Classification: Grade 2 Foul Odor Aft Wound Margin: Distinct, outline attached Slough/Fibrin Exudate Amount: Large Exudate Type: Purulent Exudate Color: yellow, brown, green er Cleansing: No o Yes Wound Bed Granulation Amount: Large (67-100%) Exposed Structure Granulation Quality: Pink Fascia Exposed: No Necrotic Amount: Small (1-33%) Fat Layer (Subcutaneous Tissue) Exposed: Yes Derks, Sherrill S. ([id number]) Necrotic Quality: Adherent Slough Tendon Exposed: No Muscle Exposed: No Joint Exposed: No Bone Exposed: No Periwound Skin Texture Texture Color No Abnormalities Noted: No No Abnormalities Noted: No Callus: No Atrophie Blanche: No Crepitus: No Cyanosis: No Excoriation: No Ecchymosis: No Induration: Yes Erythema: No Rash: No Hemosiderin Staining: No Scarring: No Mottled: No Pallor: No Moisture Rubor: No No Abnormalities Noted: No Dry / Scaly: No Temperature / Pain Maceration: Yes Temperature: No Abnormality Tenderness on Palpation: Yes Wound Preparation Ulcer Cleansing:  Rinsed/Irrigated with Saline Topical Anesthetic Applied: Other: lidocaine 4%, Treatment Notes Wound #1 (Right, Lateral Malleolus) 1. Cleansed with: Clean wound with Normal Saline 2. Anesthetic Topical Lidocaine 4% cream to wound bed prior to debridement 3. Peri-wound Care: Skin Prep 4. Dressing Applied: Aquacel Ag 5. Secondary Dressing Applied Bordered Foam Dressing Dry Gauze Electronic Signature(s) Signed: 05/29/2017 4:28:11 PM By: Alric Quan Entered By: Alric Quan on 05/29/2017 08:58:04 Pauli, Gabrielle S. ([id number]) -------------------------------------------------------------------------------- Wound Assessment Details Patient Name: [name]. Date of Service: 05/29/2017 8:45 AM Medical Record Number: [id number] Patient Account Number: [id number] Date of Birth/Sex: 12-02-1965 (51 y.o. Female) Treating RN: Carolyne Fiscal, Debi Primary Care Cobi Aldape: Delight Stare Other Clinician: Referring Daiel Strohecker: Delight Stare Treating Jarrid Lienhard/Extender: STONE III, HOYT Weeks in Treatment: 18 Wound Status Wound Number: 2 Primary Open Surgical Wound Etiology: Wound Location: Right Gluteus Wound Open Wounding Event: Gradually Appeared Status: Date Acquired: 09/18/2016 Comorbid Anemia, Arrhythmia, Coronary Artery Weeks Of Treatment: 18 History: Disease, Hypotension, Peripheral Clustered Wound: No Arterial Disease, Type II Diabetes, Osteoarthritis Photos Photo Uploaded By: Alric Quan on 05/29/2017 11:12:50 Wound Measurements Length: (cm) 2 Width: (cm) 1.4 Depth: (cm) 1.7 Area: (cm) 2.199 Volume: (cm) 3.738 % Reduction in Area: 88.6% % Reduction in Volume: 96.1% Epithelialization: None Tunneling: No Undermining: Yes Starting Position (o'clock): 3 Ending Position (o'clock): 9 Maximum Distance: (cm) 0.5 Wound Description Full Thickness Without Exposed Classification: Support Structures Wound Margin: Distinct, outline  attached Exudate Large Amount: Exudate Type: Serous Wichmann, Makya S. ([id number]) Foul Odor After Cleansing: No Slough/Fibrino No Exudate Color: amber Wound Bed Granulation Amount: Medium (34-66%) Exposed Structure Granulation Quality: Red, Pink Fascia Exposed: No Necrotic Amount: Medium (34-66%) Fat Layer (Subcutaneous Tissue) Exposed: Yes Necrotic Quality: Adherent Slough Tendon Exposed: No Muscle Exposed: No Joint Exposed: No Bone Exposed: No Periwound Skin Texture Texture Color No Abnormalities Noted: No No Abnormalities Noted: No Ecchymosis: Yes Moisture No Abnormalities Noted: No Temperature / Pain Maceration: Yes Temperature: No Abnormality Tenderness on Palpation: Yes Wound Preparation Ulcer Cleansing: Rinsed/Irrigated with Saline Topical Anesthetic Applied: Other: lidocaine 4%, Treatment Notes Wound #2 (Right Gluteus) 1. Cleansed with: Clean wound with Normal Saline 2. Anesthetic Topical Lidocaine 4% cream to wound bed prior to debridement 3. Peri-wound Care: Skin Prep 4. Dressing Applied: Prisma Ag 5. Secondary Dressing Applied Bordered Foam Dressing Dry Gauze Electronic Signature(s) Signed: 05/29/2017 4:28:11 PM By: Alric Quan Entered By: Alric Quan on  05/29/2017 09:01:58 [place], [name] ([id number]) -------------------------------------------------------------------------------- Vitals Details Patient Name: ZAREYA, TUCKETT. Date of Service: 05/29/2017 8:45 AM Medical Record Number: [id number] Patient Account Number: [id number] Date of Birth/Sex: July 22, 1966 (51 y.o. Female) Treating RN: Carolyne Fiscal, Debi Primary Care Andrya Roppolo: Forbess, LINDA Other Clinician: Referring Tacey Dimaggio: Delight Stare Treating Kaslyn [place]/Extender: STONE III, HOYT Weeks in Treatment: 18 Vital Signs Time Taken: 08:54 Temperature (F): 97.6 Height (in): 67 Pulse (bpm): 98 Weight (lbs): 137 Respiratory Rate (breaths/min): 18 Body Mass Index (BMI): 21.5 Blood  Pressure (mmHg): 72/52 Reference Range: 80 - 120 mg / dl Electronic Signature(s) Signed: 05/29/2017 4:28:11 PM By: Alric Quan Entered By: Alric Quan on 05/29/2017 08:56:30

## 2017-06-05 ENCOUNTER — Ambulatory Visit: Payer: Medicare Other | Admitting: Surgery

## 2017-06-10 ENCOUNTER — Ambulatory Visit (INDEPENDENT_AMBULATORY_CARE_PROVIDER_SITE_OTHER): Payer: Medicare Other | Admitting: Orthopedic Surgery

## 2017-06-12 ENCOUNTER — Ambulatory Visit (INDEPENDENT_AMBULATORY_CARE_PROVIDER_SITE_OTHER): Payer: Medicare Other | Admitting: Family

## 2017-06-12 ENCOUNTER — Encounter (INDEPENDENT_AMBULATORY_CARE_PROVIDER_SITE_OTHER): Payer: Self-pay | Admitting: Orthopedic Surgery

## 2017-06-12 VITALS — Wt 149.0 lb

## 2017-06-12 DIAGNOSIS — M86661 Other chronic osteomyelitis, right tibia and fibula: Secondary | ICD-10-CM

## 2017-06-12 DIAGNOSIS — I70233 Atherosclerosis of native arteries of right leg with ulceration of ankle: Secondary | ICD-10-CM

## 2017-06-12 NOTE — Progress Notes (Signed)
Office Visit Note   Patient: [name]           Date of Birth: Sep 03, 1966           MRN: [id number] Visit Date: 06/12/2017              Requested by: [name], [address] [place], [place] [address] PCP: [name], MD  Chief Complaint  Patient presents with  . Right Ankle - Follow-up      HPI: Patient presents in follow-up for a ischemic ulcer lateral malleolus right ankle with fibular osteomyelitis. Of note patient has been treated for sacral decubitus ulcer as well. Is following with wound center for ankle and sacral ulcers.   Patient has also recently seen vascular surgery and once her decubitus ulcer has resolved patient would be a candidate for revascularization.  ID also following, has completed a course of IV vancomycin and ceftriaxone x 6 weeks. However was not consistently taking this. Is now on Bactrim DS bid orally.   Has been told by ID as well as our office definitive treatment will be below or above knee amputation on the right. Patient currently prefers to continue with wound care.  Patient denies any fever or chills. She states that she has stopped smoking this year.   She tells me she has been having diarrhea for several days, though relates this has been a chronic problem. States had been scheduled for a lower GI scope which she was unable to proceed with. Feels this is unrelated to chronic abx use.  Assessment & Plan: Visit Diagnoses:  1. 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 Other chronic osteomyelitis of right fibula (HCC)     Plan: We'll have her continue with the silver cell dressings, is being changed 3 times a week to the right ankle. Recommended she see her PCP for evaluation of the diarrhea, consider c diff testing.  Follow-Up Instructions: Return in about 4 weeks (around 07/10/2017).   Ortho Exam  Patient is alert, oriented, no adenopathy, well-dressed, normal affect,  normal respiratory effort. Examination she has an antalgic gait. She does not have a palpable pulse. She has an ulcer with fibrinous tissue at the base of the wound. After debridement the base of the wound has good healthy granulation tissue the ulcers 15 mm in diameter and 3 mm deep. Epibole present. There is no exposed bone or tendon there is no purulent drainage there is no cellulitis. Silvercell, 4 x 4 and a Coban dressing was applied.  Imaging: No results found.  Labs: Lab Results  Component Value Date   HGBA1C 10.8 (H) 12/31/2016   HGBA1C 11.2 (H) 12/24/2016   HGBA1C 13.1 (H) 06/27/2011   ESRSEDRATE 13 05/12/2017   ESRSEDRATE 76 (H) 04/30/2017   ESRSEDRATE 50 (H) 04/16/2017   CRP 2.1 05/12/2017   CRP 2.8 (H) 04/30/2017   CRP <0.8 04/16/2017   REPTSTATUS 12/25/2016 FINAL 12/23/2016   GRAMSTAIN  09/30/2016    ABUNDANT WBC PRESENT,BOTH PMN AND MONONUCLEAR ABUNDANT GRAM NEGATIVE RODS ABUNDANT GRAM POSITIVE COCCI IN PAIRS IN CLUSTERS FEW GRAM POSITIVE RODS    CULT MULTIPLE SPECIES PRESENT, SUGGEST RECOLLECTION (A) 12/23/2016    Orders:  No orders of the defined types were placed in this encounter.  No orders of the defined types were placed in this encounter.    Procedures: No procedures performed  Clinical Data: No additional findings.  ROS:  All other systems negative, except as noted in the HPI. Review of Systems  Constitutional: Positive for fatigue. Negative for chills and fever.  Gastrointestinal: Positive for diarrhea.  Skin: Positive for wound.    Objective: Vital Signs: Wt 149 lb (67.6 kg)   BMI 727.49 kg/m   Specialty Comments:  No specialty comments available.  PMFS History: Patient Active Problem List   Diagnosis Date Noted  . 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 Osteomyelitis of right fibula ([place]) 03/05/2017  . 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 Elevated lactic acid level   . Osteomyelitis of ankle ([place]) 12/29/2016  . Chronic pain 12/24/2016  . Buttock wound, left, subsequent encounter   . Hypotension 12/23/2016  . IDDM (insulin dependent diabetes mellitus) ([place]) 12/23/2016  . AKI (acute kidney injury) ([place]) 12/23/2016  . Malnutrition ([place]) 12/23/2016  . Thrush 12/23/2016  . Sepsis ([place]) 11/27/2016  . DM2 (diabetes mellitus, type 2) ([place]) 11/27/2016  . Chronic back pain 11/27/2016  . Orthostatic hypotension 11/27/2016  . Demand ischemia ([place])   . Other hyperlipidemia   . Right sided weakness   . Retinal tumor of right eye   . Pressure injury of skin 10/01/2016  . Necrotizing fasciitis ([place]) 09/30/2016  . 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 Paresthesia of both hands 03/05/2016  . DDD (degenerative disc disease), lumbosacral 03/26/2015  . DDD (degenerative disc disease), cervical 03/26/2015  . Sacroiliac joint disease 03/26/2015  . Occipital neuralgia 03/26/2015  . Pain in limb-Bilateral Leg 04/25/2014  . PAD (peripheral artery disease) ([place]) 04/05/2013  . Chronic total occlusion of artery of the extremities ([place]) 02/10/2012   Past Medical History:  Diagnosis Date  . Arthritis   . Asthma   . Chronic back pain   . Depression   . Diabetes mellitus   . Diabetic neuropathy ([place])   . Family history of adverse reaction to anesthesia    mother had difficlty waking   . GERD (gastroesophageal reflux disease)   . Hyperlipidemia   . Joint pain   . Leg pain    With Walking  . Osteomyelitis of right fibula ([place]) 03/05/2017  . PAD (peripheral artery disease) ([place])   . Reflux   . Ulcer    Foot    Family History  Problem Relation Age of Onset  . Coronary artery disease Mother   . Peripheral vascular disease Mother   . Heart disease Mother        Before age 55  . Other Mother        Venous insuffiency  . Diabetes Mother   . Hyperlipidemia Mother   . Hypertension Mother   .  Varicose Veins Mother   . Heart attack Mother        before age 1  . Heart disease Father   . Diabetes Father   . Diabetes Maternal Grandmother   . Diabetes Paternal Grandmother   . Diabetes Paternal Grandfather   . Diabetes Sister   . Hypertension Sister   . Diabetes Brother   . Hypertension Brother     Past Surgical History:  Procedure Laterality Date  . ABDOMINAL AORTAGRAM  June 15, 2014  . ABDOMINAL AORTAGRAM N/A 06/15/2014   Procedure: ABDOMINAL Maxcine Ham;  Surgeon: Serafina Mitchell, MD;  Location[facility] CATH LAB;  Service: Cardiovascular;  Laterality: N/A;  . ABDOMINAL AORTAGRAM N/A 11/22/2014   Procedure: ABDOMINAL AORTAGRAM;  Surgeon: Serafina Mitchell, MD;  Location: [facility] CATH LAB;  Service: Cardiovascular;  Laterality: N/A;  . ABDOMINAL AORTOGRAM W/LOWER EXTREMITY N/A 01/07/2017   Procedure: Abdominal Aortogram w/Lower Extremity;  Surgeon: Serafina Mitchell, MD;  Location: [facility] CV LAB;  Service: Cardiovascular;  Laterality: N/A;  . ARTERIAL BYPASS SURGRY  07/05/2010   Right Common Femoral to below knee popliteal BPG  . BACK SURGERY     X's  2  . CARDIAC CATHETERIZATION    . CHOLECYSTECTOMY     Gall Bladder  . CYSTECTOMY Right    foot  . CYSTECTOMY Left    wrist  . INTERCOSTAL NERVE BLOCK  November 2015  . IR FLUORO GUIDE CV LINE RIGHT  03/20/2017  . IR US GUIDE VASC ACCESS RIGHT  03/20/2017  . IRRIGATION AND DEBRIDEMENT BUTTOCKS Right 09/30/2016   Procedure: DEBRIDEMENT RIGHT  BUTTOCK WOUND;  Surgeon: Georganna Skeans, MD;  Location: [facility];  Service: General;  Laterality: Right;  . left foot surgery    . left wrist cyst removal Left   . PERIPHERAL VASCULAR CATHETERIZATION N/A 05/07/2016   Procedure: Abdominal Aortogram;  Surgeon: Serafina Mitchell, MD;  Location: [place] CV LAB;  Service: Cardiovascular;  Laterality: N/A;  . PERIPHERAL VASCULAR CATHETERIZATION N/A 05/07/2016   Procedure: Lower Extremity Angiography;  Surgeon: Serafina Mitchell, MD;  Location: [place] CV LAB;   Service: Cardiovascular;  Laterality: N/A;  . PERIPHERAL VASCULAR CATHETERIZATION N/A 05/07/2016   Procedure: Aortic Arch Angiography;  Surgeon: Serafina Mitchell, MD;  Location: [facility] CV LAB;  Service: Cardiovascular;  Laterality: N/A;  . PERIPHERAL VASCULAR CATHETERIZATION N/A 05/07/2016   Procedure: Upper Extremity Angiography;  Surgeon: Serafina Mitchell, MD;  Location: [facility] CV LAB;  Service: Cardiovascular;  Laterality: N/A;  . PERIPHERAL VASCULAR CATHETERIZATION Right 05/07/2016   Procedure: Peripheral Vascular Balloon Angioplasty;  Surgeon: Serafina Mitchell, MD;  Location: [facility] CV LAB;  Service: Cardiovascular;  Laterality: Right;  subclavian  . PERIPHERAL VASCULAR CATHETERIZATION Right 05/07/2016   Procedure: Peripheral Vascular Intervention;  Surgeon: Serafina Mitchell, MD;  Location: [facility] CV LAB;  Service: Cardiovascular;  Laterality: Right;  External  Iliac  . SKIN GRAFT Right 2012   [name] by Dr. Nils Pyle- Right and Left Ankle  . SPINE SURGERY    . TONSILLECTOMY     Social History   Occupational History  . disabled    Social History Main Topics  . Smoking status: Former Smoker    Years: 30.00    Types: Cigarettes    Quit date: 02/07/2017  . Smokeless tobacco: Never Used  . Alcohol use No  . Drug use: No  . Sexual activity: Yes    Birth control/ protection: Post-menopausal

## 2017-06-13 ENCOUNTER — Encounter: Payer: Medicare Other | Admitting: Surgery

## 2017-06-13 DIAGNOSIS — E11621 Type 2 diabetes mellitus with foot ulcer: Secondary | ICD-10-CM | POA: Diagnosis not present

## 2017-06-15 NOTE — Progress Notes (Signed)
Jamie, [name] ([id number]) Visit Report for 06/13/2017 Chief Complaint Document Details Patient Name: [name], [name]. Date of Service: 06/13/2017 2:45 PM Medical Record Number: [id number] Patient Account Number: [id number] Date of Birth/Sex: 05/04/1966 (51 y.o. Female) Treating RN: Afful, RN, [name], Velva Harman Primary Care Provider: Delight Stare Other Clinician: Referring Provider: Delight Stare Treating Provider/Extender: Frann Rider in Treatment: 20 Information Obtained from: Patient Chief Complaint Patients presents for treatment of an open diabetic ulcer with an arterial etiology on her right lateral ankle and right gluteal area Electronic Signature(s) Signed: 06/13/2017 3:50:19 PM By: Christin Fudge MD, FACS Entered By: Christin Fudge on 06/13/2017 15:50:19 Blasing, [name] ([id number]) -------------------------------------------------------------------------------- HPI Details Patient Name: [name]. Date of Service: 06/13/2017 2:45 PM Medical Record Number: [id number] Patient Account Number: [id number] Date of Birth/Sex: 1966/08/16 (51 y.o. Female) Treating RN: Afful, RN, [name], Velva Harman Primary Care Provider: Delight Stare Other Clinician: Referring Provider: Delight Stare Treating Provider/Extender: Frann Rider in Treatment: 20 History of Present Illness Location: right lateral ankle and right gluteal area Quality: Patient reports experiencing a sharp pain to affected area(s). Severity: Patient states wound are getting better slowly Duration: Patient has had the wound for > 4 months prior to seeking treatment at the wound center Timing: Pain in wound is constant (hurts all the time) Context: The wound occurred when the patient was in hospital with a necrotizing fasciitis of the right gluteal area and a ulceration on the right ankle Modifying Factors: Other treatment(s) tried include:admitted to the hospital for IV antibiotics and a full workup and has also had a  recent angiogram Associated Signs and Symptoms: Patient reports having increase discharge. HPI Description: 51 year old patient was sent to [place] from [facility] where she was seen by Dr. Sherril Cong for a left ankle ulceration and was recently hospitalized with hypotension and sepsis. She was treated with IV antibiotics and has been scheduled to see Dr. Sharol Given. She was seen by vascular surgery who recommended a femoral bypass but surgery has been delayed until her sacral wound from last year's necrotizing fasciitis has healed. Was seeing the wound care team at [facility] but wanted to change over. She is a smoker and smokes a pack of cigarettes a day The patient was recently admitted in [place] between February 2 and February 14. She had a follow- up to see vascular surgery, orthopedic surgery and infectious disease. 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 Review of the electronic medical records reveals that Dr. 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 An MRI showed edema in the bone which was consistent with osteomyelitis and the chronic ulcer and may need surgical intervention. She also had a sacral decubitus ulcer which was treated with the wound VAC. On 01/07/2017 she was taken up by the vascular surgeon Dr. Trula Slade for an abdominal aortogram and bilateral lower extremity runoff, for a history of having bilateral femoropopliteal bypass graft as well as external iliac stenting on the right and stenting of her bypass graft. She had developed a nonhealing  wound on her right ankle and there was a possibility of a femoral occlusion. the findings  were noted and the impression was a surgical revascularization with a aorto bifemoral bypass graft. Both the femoropopliteal bypass grafts were patent. 01/31/2017 -- x-ray of the right hip and pelvis -- IMPRESSION:No radiographic evidence of acute osteomyelitis. Normal-appearing right hip joint space for age. No acute bony abnormality of the hip. 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 Since seeing her last week she has not had an appointment with infectious disease or the orthopedic specialist yet. [name], [name] ([id number]) 02/06/2017 -- the patient missed a couple of appointments yesterday due to the weather but other than that has apparently given up smoking for the last 5 days. 02/13/2017 -- she has rescheduled her infectious disease appointment and also the appointment with the orthopedic surgeon Dr. Sharol Given 03/06/2017 -- she was seen by Dr. Lucianne Lei dam of infectious disease on 03/05/2017 and he recommendedIV antibiotics but the patient did not want to have that and he has given her Augmentin and doxycycline and will reevaluate her in 2 months time. 03/13/2017 -- he saw Dr. Trula Slade regarding her vascular issues and he would like to wait to the sacral wound is completely closed before considering aortobifemoral bypass graft. He will see her back in 2 months time. She was also seen by Dr. Sharol Given of orthopedics who recommended continue wound care dressings and follow in the office in about 2 months time. he also recommended 3 view radiographs of the right ankle at follow-up. 03/28/2017 -- she did have a PICC line placed and Dr. Lucianne Lei dam has begun IV vancomycin and ceftriaxone. she is going to continue this until she sees him in approximately a month's time 04/18/2017 -- we had applied for a skin substitute for the patient's care but her copayment  is going to be about $300 a piece and the patient will not be able to afford this. 05/08/17 on evaluation today patient appears to potentially have a fungal rash in the periwound region of the sacral area. This also seems to extend into the inguinal creases and factional region as well. She is tender to palpation with light rubbing over the region of the periwound and the skin in this region does have a beefy red appearance. Everything seems to point to a fungal infection at this time. She has been tolerating the dressing changes up to this point. There is no evidence of infection otherwise. 05/16/2017 -- was seen by Dr. 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 He did not think that she is making progress and was not able to examine her sacral decubitus ulcer as the patient would not allow it. She was switched to Bactrim DS 2 tablets twice a day since finishing her antibiotics. Her ESR was 76 and a CRP was 2.8. 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 05/23/17 on evaluation today patient's wound appears to be doing about the same in regard to the ankle as well as the gluteal area. She apparently has issues with the one back staying in place and she states the sill seems to break up the inferior aspect. She does have home help coming out to apply the wound VAC. She has been tolerating the dressing changes without any problem although she has had some increased pain in the gluteal region due to  a fall she sustained and she feels like she brews that area and she had pain actually radiating down her leg which is slowly beginning to improve. There's no evidence of infection. 05/29/17 Unfortunately on evaluation today patient's wounds both in regard to the ankle as well as in regard to her gluteal region on the  right appear to be measuring the same as last week's evaluation. She also tells me that she is having soreness today in regard to the right gluteal area because she had a visitor who came to see her a couple days ago and stayed for six hours and she had to set up for that entire length of time visiting. This has called some increased discomfort. Normally she does not set up for those long periods of time. Fortunately there's no evidence of infection and no worsening of the wounds. 06/13/2017 -- the patient has had a lot of health issues including perfuse diarrhea and generalized weakness and I have instructed her to see her PCP as soon as possible and if things get worse to go to the ER. She has not brought her wound VAC today. She continues to be on oral antibiotics Electronic Signature(s) MAIZEY, MENENDEZ ([id number]) Signed: 06/13/2017 3:51:54 PM By: Christin Fudge MD, FACS Entered By: Christin Fudge on 06/13/2017 15:51:54 Petree, [name] ([id number]) -------------------------------------------------------------------------------- Physical Exam Details Patient Name: [name], [name] Date of Service: 06/13/2017 2:45 PM Medical Record Number: [id number] Patient Account Number: [id number] Date of Birth/Sex: 04/02/1966 (51 y.o. Female) Treating RN: Baruch Gouty, RN, [name], [employer] Primary Care Provider: Delight Stare Other Clinician: Referring Provider: Delight Stare Treating Provider/Extender: Frann Rider in Treatment: 20 Constitutional . Pulse regular. Respirations normal and unlabored. Afebrile. . Eyes Nonicteric. Reactive to light. Ears, Nose, Mouth, and Throat Lips, teeth, and gums WNL.Marland Kitchen Moist mucosa without lesions. Neck supple and nontender. No palpable supraclavicular or cervical adenopathy. Normal sized without goiter. Respiratory WNL. No retractions.. Cardiovascular Pedal Pulses WNL. No clubbing, cyanosis or edema. Lymphatic No adneopathy. No adenopathy. No  adenopathy. Musculoskeletal Adexa without tenderness or enlargement.. Digits and nails w/o clubbing, cyanosis, infection, petechiae, ischemia, or inflammatory conditions.. Integumentary (Hair, Skin) No suspicious lesions. No crepitus or fluctuance. No peri-wound warmth or erythema. No masses.Marland Kitchen Psychiatric Judgement and insight Intact.. No evidence of depression, anxiety, or agitation.. Notes the right gluteal region is looking cleaner has healthy granulation tissue and there is no maceration The right lateral ankle wound has healthy granulation tissue and there is no surrounding maceration and no sharp debridement was required today. Electronic Signature(s) Signed: 06/13/2017 3:53:11 PM By: Christin Fudge MD, FACS Entered By: Christin Fudge on 06/13/2017 15:53:11 Diefendorf, [name] ([id number]) -------------------------------------------------------------------------------- Physician Orders Details Patient Name: [name], [name]. Date of Service: 06/13/2017 2:45 PM Medical Record Number: [id number] Patient Account Number: [id number] Date of Birth/Sex: Dec 14, 1965 (51 y.o. Female) Treating RN: Afful, RN, [name], Velva Harman Primary Care Provider: Delight Stare Other Clinician: Referring Provider: Delight Stare Treating Provider/Extender: Frann Rider in Treatment: 20 Verbal / Phone Orders: No Diagnosis Coding Wound Cleansing Wound #1 Right,Lateral Malleolus o Clean wound with Normal Saline. Wound #2 Right Gluteus o Clean wound with Normal Saline. Anesthetic Wound #1 Right,Lateral Malleolus o Topical Lidocaine 4% cream applied to wound bed prior to debridement Wound #2 Right Gluteus o Topical Lidocaine 4% cream applied to wound bed prior to debridement Primary Wound Dressing Wound #1 Right,Lateral Malleolus o Aquacel Ag Wound #2 Right Gluteus o Prisma Ag - or equal under wound vac. Secondary Dressing Wound #1 Right,Lateral  Malleolus o Boardered Foam Dressing Wound #2 Right  Gluteus o Boardered Foam Dressing Dressing Change Frequency Wound #1 Right,Lateral Malleolus o Change Dressing Monday, Wednesday, Friday Wound #2 Right Gluteus o Change Dressing Monday, Wednesday, Friday Follow-up Appointments Syler, Yanna S. ([id number]) Wound #1 Right,Lateral Malleolus o Return Appointment in 1 week. Wound #2 Right Gluteus o Return Appointment in 1 week. Off-Loading Wound #1 Right,Lateral Malleolus o Turn and reposition every 2 hours Wound #2 Right Gluteus o Turn and reposition every 2 hours Home Health Wound #1 [place] Visits - Monday, Wednesday, Friday o Home Health Nurse may visit PRN to address patientos wound care needs. o FACE TO FACE ENCOUNTER: MEDICARE and MEDICAID PATIENTS: I certify that this patient is under my care and that I had a face-to-face encounter that meets the physician face-to-face encounter requirements with this patient on this date. The encounter with the patient was in whole or in part for the following MEDICAL CONDITION: (primary reason for [place]) MEDICAL NECESSITY: I certify, that based on my findings, NURSING services are a medically necessary home health service. HOME BOUND STATUS: I certify that my clinical findings support that this patient is homebound (i.e., Due to illness or injury, pt requires aid of supportive devices such as crutches, cane, wheelchairs, walkers, the use of special transportation or the assistance of another person to leave their place of residence. There is a normal inability to leave the home and doing so requires considerable and taxing effort. Other absences are for medical reasons / religious services and are infrequent or of short duration when for other reasons). o If current dressing causes regression in wound condition, may D/C ordered dressing product/s and apply Normal Saline Moist Dressing daily until next [place] /  Other MD appointment. [place] of regression in wound condition at [phone]. o Please direct any NON-WOUND related issues/requests for orders to patient's Primary Care Physician Wound #2 Right [place] Visits - Monday, Wednesday, Friday o Home Health Nurse may visit PRN to address patientos wound care needs. o FACE TO FACE ENCOUNTER: MEDICARE and MEDICAID PATIENTS: I certify that this patient is under my care and that I had a face-to-face encounter that meets the physician face-to-face encounter requirements with this patient on this date. The encounter with the patient was in whole or in part for the following MEDICAL CONDITION: (primary reason for [place]) MEDICAL NECESSITY: I certify, that based on my findings, NURSING services are a medically necessary home health service. HOME BOUND STATUS: I certify that my clinical findings support that this patient is homebound (i.e., Due to illness or injury, pt requires aid of supportive devices such as crutches, cane, wheelchairs, walkers, the use of special transportation or the assistance of another person to leave their place of residence. There is a normal inability to leave the home and doing so requires considerable and taxing effort. Other [name], [name] ([id number]) absences are for medical reasons / religious services and are infrequent or of short duration when for other reasons). o If current dressing causes regression in wound condition, may D/C ordered dressing product/s and apply Normal Saline Moist Dressing daily until next [place] / Other MD appointment. [place] of regression in wound condition at [phone]. o Please direct any NON-WOUND related issues/requests for orders to patient's Primary Care Physician Negative Pressure Wound Therapy Wound #2 Right Gluteus o Wound VAC settings at 125/130 mmHg continuous pressure.  Use  BLACK/GREEN foam to wound cavity. Use WHITE foam to fill any tunnel/s and/or undermining. Change VAC dressing 2 X WEEK. Change canister as indicated when full. Nurse may titrate settings and frequency of dressing changes as clinically indicated. - Col[place]gen dressing under wound [place] Nurse may d/c VAC for s/s of increased infection, significant wound regression, or uncontrolled drainage. [place] at [phone]. o Apply contact layer over base of wound. o Number of foam/gauze pieces used in the dressing = o Place NPWT on HOLD. Electronic Signature(s) Signed: 06/13/2017 3:26:41 PM By: Regan Lemming [name], RN Signed: 06/13/2017 3:58:43 PM By: Christin Fudge MD, FACS Entered By: Regan Lemming on 06/13/2017 15:07:26 Teagle, [name] ([id number]) -------------------------------------------------------------------------------- Problem List Details Patient Name: [name], [name]. Date of Service: 06/13/2017 2:45 PM Medical Record Number: [id number] Patient Account Number: [id number] Date of Birth/Sex: 12-20-1965 (51 y.o. Female) Treating RN: Afful, RN, [name], [employer] Primary Care Provider: Delight Stare Other Clinician: Referring Provider: Delight Stare Treating Provider/Extender: Frann Rider in Treatment: 20 Active Problems ICD-10 Encounter Code Description Active Date Diagnosis E11.621 Type 2 diabetes mellitus with foot ulcer 01/23/2017 Yes L89.314 Pressure ulcer of right buttock, stage 4 01/23/2017 Yes L97.312 Non-pressure chronic ulcer of right ankle with fat layer 01/23/2017 Yes exposed F17.218 Nicotine dependence, cigarettes, with other nicotine- 01/23/2017 Yes induced disorders I70.233 Atherosclerosis of native arteries of right leg with 01/23/2017 Yes ulceration of ankle M86.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 ([id number]) -------------------------------------------------------------------------------- Progress Note Details Patient Name: [name]. Date of Service: 06/13/2017 2:45 PM Medical Record Number: [id number] Patient Account Number: [id number] Date of Birth/Sex: [birth year] (51 y.o. Female) Treating RN: Afful, RN, [name], [employer] Primary Care Provider: Delight Stare Other Clinician: Referring Provider: Delight Stare Treating Provider/Extender: Frann Rider in Treatment: 20 Subjective Chief Complaint Information obtained from Patient Patients presents for treatment of an open diabetic ulcer with an arterial etiology on her right lateral ankle and right gluteal area History of Present Illness (HPI) The following HPI elements were documented for the patient's wound: Location: right lateral ankle and right gluteal area Quality: Patient reports experiencing a sharp pain to affected area(s). Severity: Patient states wound are getting better slowly Duration: Patient has had the wound for > 4 months prior to seeking treatment at the wound center Timing: Pain in wound is constant (hurts all the time) Context: The wound occurred when the patient was in hospital with a necrotizing fasciitis of the right gluteal area and a ulceration on the right ankle Modifying Factors: Other treatment(s) tried include:admitted to the hospital for IV antibiotics and a full workup and has also had a recent angiogram Associated Signs and Symptoms: Patient reports having increase discharge. 51 year old patient was sent to [place] from [facility] where she was seen by Dr. Sherril Cong for a left ankle ulceration and was recently hospitalized with hypotension and sepsis. She was treated with IV antibiotics and has been scheduled to see Dr. Sharol Given. She was seen by vascular surgery who recommended a femoral bypass but surgery has been  delayed until her sacral wound from last year's necrotizing fasciitis has healed. Was seeing the wound care team at [facility] but wanted to change over. She is a smoker and smokes a pack of cigarettes a day The patient was recently admitted in [place] between  February 2 and February 14. She had a follow- up to see vascular surgery, orthopedic surgery and infectious disease. 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 Review of the electronic medical records reveals that Dr. 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 An MRI showed edema in the bone which was consistent with osteomyelitis and the chronic ulcer and may need surgical intervention. She also had a sacral decubitus ulcer which was treated with the wound VAC. On 01/07/2017 she was taken up by the vascular surgeon Dr. Trula Slade for an abdominal aortogram and bilateral lower extremity runoff, for a history of having bilateral femoropopliteal bypass graft as well as external iliac stenting on the right and stenting of her bypass graft. She had developed a nonhealing wound on her right ankle and there was a possibility of a femoral occlusion. the findings were noted and the [name], [name]. ([id number]) impression was a surgical revascularization with a aorto bifemoral bypass graft. Both the femoropopliteal bypass grafts were patent. 01/31/2017 -- x-ray of the right hip and pelvis -- IMPRESSION:No radiographic evidence of acute osteomyelitis. Normal-appearing right hip joint space for age. No acute bony abnormality of  the hip. 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 Since seeing her last week she has not had an appointment with infectious disease or the orthopedic specialist yet. 02/06/2017 -- the patient missed a couple of appointments yesterday due to the weather but other than that has apparently given up smoking for the last 5 days. 02/13/2017 -- she has rescheduled her infectious disease appointment and also the appointment with the orthopedic surgeon Dr. Sharol Given 03/06/2017 -- she was seen by Dr. Lucianne Lei dam of infectious disease on 03/05/2017 and he recommendedIV antibiotics but the patient did not want to have that and he has given her Augmentin and doxycycline and will reevaluate her in 2 months time. 03/13/2017 -- he saw Dr. Trula Slade regarding her vascular issues and he would like to wait to the sacral wound is completely closed before considering aortobifemoral bypass graft. He will see her back in 2 months time. She was also seen by Dr. Sharol Given of orthopedics who recommended continue wound care dressings and follow in the office in about 2 months time. he also recommended 3 view radiographs of the right ankle at follow-up. 03/28/2017 -- she did have a PICC line placed and Dr. Lucianne Lei dam has begun IV vancomycin and ceftriaxone. she is going to continue this until she sees him in approximately a month's time 04/18/2017 -- we had applied for a skin substitute for the patient's care but her copayment is going to be about $300 a piece and the patient will not be able to afford this. 05/08/17 on evaluation today patient appears to potentially have a fungal rash in the periwound region of the sacral area. This also seems to extend into the inguinal creases and factional region as well. She is tender to palpation with light rubbing over the region of the periwound and the skin in this region does have a beefy red appearance. Everything seems to point to  a  fungal infection at this time. She has been tolerating the dressing changes up to this point. There is no evidence of infection otherwise. 05/16/2017 -- was seen by Dr. 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 He did not think that she is making progress and was not able to examine her sacral decubitus ulcer as the patient would not allow it. She was switched to Bactrim DS 2 tablets twice a day since finishing her antibiotics. Her ESR was 76 and a CRP was 2.8. 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 05/23/17 on evaluation today patient's wound appears to be doing about the same in regard to the ankle as well as the gluteal area. She apparently has issues with the one back staying in place and she states the sill seems to break up the inferior aspect. She does have home help coming out to apply the wound VAC. She has been tolerating the dressing changes without any problem although she has had some increased pain in the gluteal region due to a fall she sustained and she feels like she brews that area and she had pain actually radiating down her leg which is slowly beginning to improve. There's no evidence of infection. 05/29/17 Unfortunately on evaluation today patient's wounds both in regard to the ankle as well as in regard to her gluteal region on the right appear to be measuring the same as last week's evaluation. She also tells me that she is having soreness today in regard to the right gluteal area because she had a visitor who came to see her a couple days ago and stayed for six hours and she had to set up for that entire length of time [name], [name]. ([id number]) visiting. This has called some increased discomfort. Normally she does not set up for those long periods of time. Fortunately there's no  evidence of infection and no worsening of the wounds. 06/13/2017 -- the patient has had a lot of health issues including perfuse diarrhea and generalized weakness and I have instructed her to see her PCP as soon as possible and if things get worse to go to the ER. She has not brought her wound VAC today. She continues to be on oral antibiotics Objective Constitutional Pulse regular. Respirations normal and unlabored. Afebrile. Vitals Time Taken: 2:56 PM, Height: 67 in, Weight: 137 lbs, BMI: 21.5, Temperature: 97.8 F, Pulse: 93 bpm, Respiratory Rate: 16 breaths/min, Blood Pressure: 90/62 mmHg. Eyes Nonicteric. Reactive to light. Ears, Nose, Mouth, and Throat Lips, teeth, and gums WNL.Marland Kitchen Moist mucosa without lesions. Neck supple and nontender. No palpable supraclavicular or cervical adenopathy. Normal sized without goiter. Respiratory WNL. No retractions.. Cardiovascular Pedal Pulses WNL. No clubbing, cyanosis or edema. Lymphatic No adneopathy. No adenopathy. No adenopathy. Musculoskeletal Adexa without tenderness or enlargement.. Digits and nails w/o clubbing, cyanosis, infection, petechiae, ischemia, or inflammatory conditions.Marland Kitchen Psychiatric Judgement and insight Intact.. No evidence of depression, anxiety, or agitation.Marland Kitchen MAKHIA, VOSLER ([id number]) General Notes: the right gluteal region is looking cleaner has healthy granulation tissue and there is no maceration The right lateral ankle wound has healthy granulation tissue and there is no surrounding maceration and no sharp debridement was required today. Integumentary (Hair, Skin) No suspicious lesions. No crepitus or fluctuance. No peri-wound warmth or erythema. No masses.. Wound #1 status is Open.  Original cause of wound was Gradually Appeared. The wound is located on the Right,Lateral Malleolus. The wound measures 1.5cm length x 1.5cm width x 0.2cm depth; 1.767cm^2 area and 0.353cm^3 volume. There is Fat Layer (Subcutaneous  Tissue) Exposed exposed. There is no tunneling or undermining noted. There is a large amount of purulent drainage noted. The wound margin is distinct with the outline attached to the wound base. There is large (67-100%) pink granulation within the wound bed. There is a small (1-33%) amount of necrotic tissue within the wound bed including Adherent Slough. The periwound skin appearance exhibited: Induration, Maceration. The periwound skin appearance did not exhibit: Callus, Crepitus, Excoriation, Rash, Scarring, Dry/Scaly, Atrophie Blanche, Cyanosis, Ecchymosis, Hemosiderin Staining, Mottled, Pallor, Rubor, Erythema. Periwound temperature was noted as No Abnormality. The periwound has tenderness on palpation. Wound #2 status is Open. Original cause of wound was Gradually Appeared. The wound is located on the Right Gluteus. The wound measures 2.8cm length x 2cm width x 1.6cm depth; 4.398cm^2 area and 7.037cm^3 volume. There is Fat Layer (Subcutaneous Tissue) Exposed exposed. There is no tunneling or undermining noted. There is a large amount of serous drainage noted. The wound margin is distinct with the outline attached to the wound base. There is medium (34-66%) red, pink granulation within the wound bed. There is a medium (34-66%) amount of necrotic tissue within the wound bed including Adherent Slough. The periwound skin appearance exhibited: Maceration, Ecchymosis. Periwound temperature was noted as No Abnormality. The periwound has tenderness on palpation. 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 ([id number]) Wound #1 Right,Lateral Malleolus: Clean wound with Normal Saline. Wound #2 Right Gluteus: Clean wound with Normal Saline. Anesthetic: Wound #1 Right,Lateral Malleolus: Topical Lidocaine 4% cream applied to wound bed prior to debridement Wound #2 Right Gluteus: Topical Lidocaine 4% cream applied to wound bed prior to debridement Primary Wound Dressing: Wound #1 Right,Lateral Malleolus: Aquacel Ag Wound #2 Right Gluteus: Prisma Ag - or equal under wound vac. Secondary Dressing: Wound #1 Right,Lateral Malleolus: Boardered Foam Dressing Wound #2 Right Gluteus: Boardered Foam Dressing Dressing Change Frequency: Wound #1 Right,Lateral Malleolus: Change Dressing Monday, Wednesday, Friday Wound #2 Right Gluteus: Change Dressing Monday, Wednesday, Friday Follow-up Appointments: Wound #1 Right,Lateral Malleolus: Return Appointment in 1 week. Wound #2 Right Gluteus: Return Appointment in 1 week. Off-Loading: Wound #1 Right,Lateral Malleolus: Turn and reposition every 2 hours Wound #2 Right Gluteus: Turn and reposition every 2 hours Home Health: Wound #1 Right,Lateral Malleolus: [place] Visits - Monday, Wednesday, Friday [place] Nurse may visit PRN to address patient s wound care needs. FACE TO FACE ENCOUNTER: MEDICARE and MEDICAID PATIENTS: I certify that this patient is under my care and that I had a face-to-face encounter that meets the physician face-to-face encounter requirements with this patient on this date. The encounter with the patient was in whole or in part for the following MEDICAL CONDITION: (primary reason for [place]) MEDICAL NECESSITY: I certify, that based on my findings, NURSING services are a medically necessary home health service. HOME BOUND STATUS: I certify that my clinical findings support that this patient is homebound (i.e., Due to  illness or injury, pt requires aid of  supportive devices such as crutches, cane, wheelchairs, walkers, the use of special transportation or the assistance of another person to leave their place of residence. There is a normal inability to leave the home and doing so requires considerable and taxing effort. Other absences are for medical reasons / religious services and are infrequent or of short duration when for other reasons). If current dressing causes regression in wound condition, may D/C ordered dressing product/s and apply Normal Saline Moist Dressing daily until next [place] / Other MD appointment. [place] of regression in wound condition at [phone]. [name], [name] ([id number]) Please direct any NON-WOUND related issues/requests for orders to patient's Primary Care Physician Wound #2 Right Gluteus: [place] Visits - Monday, Wednesday, Friday Home Health Nurse may visit PRN to address patient s wound care needs. FACE TO FACE ENCOUNTER: MEDICARE and MEDICAID PATIENTS: I certify that this patient is under my care and that I had a face-to-face encounter that meets the physician face-to-face encounter requirements with this patient on this date. The encounter with the patient was in whole or in part for the following MEDICAL CONDITION: (primary reason for [place]) MEDICAL NECESSITY: I certify, that based on my findings, NURSING services are a medically necessary home health service. HOME BOUND STATUS: I certify that my clinical findings support that this patient is homebound (i.e., Due to illness or injury, pt requires aid of supportive devices such as crutches, cane, wheelchairs, walkers, the use of special transportation or the assistance of another person to leave their place of residence. There is a normal inability to leave the home and doing so requires considerable and taxing effort. Other absences are for medical reasons / religious services and are infrequent or  of short duration when for other reasons). If current dressing causes regression in wound condition, may D/C ordered dressing product/s and apply Normal Saline Moist Dressing daily until next [place] / Other MD appointment. [place] of regression in wound condition at [phone]. Please direct any NON-WOUND related issues/requests for orders to patient's Primary Care Physician Negative Pressure Wound Therapy: Wound #2 Right Gluteus: Wound VAC settings at 125/130 mmHg continuous pressure. Use BLACK/GREEN foam to wound cavity. Use WHITE foam to fill any tunnel/s and/or undermining. Change VAC dressing 2 X WEEK. Change canister as indicated when full. Nurse may titrate settings and frequency of dressing changes as clinically indicated. - Collegen dressing under wound [place] Nurse may d/c VAC for s/s of increased infection, significant wound regression, or uncontrolled drainage. [place] at [phone]. Apply contact layer over base of wound. Number of foam/gauze pieces used in the dressing = Place NPWT on HOLD. after review today I have recommended: 1. Silver collagen to be applied to the right ankle, change daily 2. Silver collagen under the black foam for the wound VAC, to the right gluteal region to be changed 3 times a week. 3. adequate protein, vitamin A, vitamin C and zinc Electronic Signature(s) Signed: 06/13/2017 3:54:02 PM By: Christin Fudge MD, FACS Entered By: Christin Fudge on 06/13/2017 15:54:02 Ola, Kimaya S. ([id number]) -------------------------------------------------------------------------------- SuperBill Details Patient Name: [name]. Date of Service: 06/13/2017 Medical Record Number: [id number] Patient Account Number: [id number] Date of Birth/Sex: [birth year] (51 y.o. 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 M86.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, right ankle and foot Facility Procedures CPT4 Code: [id number] Description: 99213 - WOUND CARE VISIT-LEV 3 EST PT Modifier: Quantity: 1 Physician Procedures CPT4 Code Description: [id number] 99213 - WC PHYS LEVEL 3 - EST PT ICD-10 Description Diagnosis E11.621 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

## 2017-06-16 NOTE — Progress Notes (Addendum)
[name] ([id number]) Visit Report for 06/13/2017 Arrival Information Details Patient Name: [name], [name]. Date of Service: 06/13/2017 2:45 PM Medical Record Number: [id number] Patient Account Number: [id number] Date of Birth/Sex: July 09, 1966 (51 y.o. Female) Treating RN: Afful, RN, [name], Velva Harman Primary Care Merinda Victorino: Delight Stare Other Clinician: Referring Leaman Abe: Delight Stare Treating Yomayra Tate/Extender: Frann Rider in Treatment: 20 Visit Information History Since Last Visit All ordered tests and consults were completed: No Patient Arrived: [name] Added or deleted any medications: No Arrival Time: 14:49 Any new allergies or adverse reactions: No Accompanied By: self Had a fall or experienced change in No Transfer Assistance: None activities of daily living that may affect Patient Identification Verified: Yes risk of falls: Secondary Verification Process Completed: Yes Signs or symptoms of abuse/neglect since last No Patient Requires Transmission-Based No visito Precautions: Hospitalized since last visit: No Patient Has Alerts: No Has Dressing in Place as Prescribed: Yes Pain Present Now: No Electronic Signature(s) Signed: 06/13/2017 2:49:52 PM By: Regan Lemming [name], RN Entered By: Regan Lemming on 06/13/2017 14:49:52 Hepp, Sindia Chauncey Cruel ([id number]) -------------------------------------------------------------------------------- Clinic Level of Care Assessment Details Patient Name: [name]. Date of Service: 06/13/2017 2:45 PM Medical Record Number: [id number] Patient Account Number: [id number] Date of Birth/Sex: 1966-03-23 (51 y.o. Female) Treating RN: Afful, RN, [name], [employer] Primary Care Slayde Brault: Delight Stare Other Clinician: Referring Oluwaseyi Raffel: Delight Stare Treating Krosby Ritchie/Extender: Frann Rider in Treatment: 20 Clinic Level of Care Assessment Items TOOL 4 Quantity Score []  - Use when only an EandM is performed on FOLLOW-UP visit 0 ASSESSMENTS -  Nursing Assessment / Reassessment X - Reassessment of Co-morbidities (includes updates in patient status) 1 10 X - Reassessment of Adherence to Treatment Plan 1 5 ASSESSMENTS - Wound and Skin Assessment / Reassessment []  - Simple Wound Assessment / Reassessment - one wound 0 X - Complex Wound Assessment / Reassessment - multiple wounds 2 5 []  - Dermatologic / Skin Assessment (not related to wound area) 0 ASSESSMENTS - Focused Assessment []  - Circumferential Edema Measurements - multi extremities 0 []  - Nutritional Assessment / Counseling / Intervention 0 X - Lower Extremity Assessment (monofilament, tuning fork, pulses) 1 5 []  - Peripheral Arterial Disease Assessment (using hand held doppler) 0 ASSESSMENTS - Ostomy and/or Continence Assessment and Care []  - Incontinence Assessment and Management 0 []  - Ostomy Care Assessment and Management (repouching, etc.) 0 PROCESS - Coordination of Care X - Simple Patient / Family Education for ongoing care 1 15 []  - Complex (extensive) Patient / Family Education for ongoing care 0 []  - Staff obtains Programmer, systems, Records, Test Results / Process Orders 0 X - Staff telephones HHA, Nursing Homes / Clarify orders / etc 1 10 []  - Routine Transfer to another Facility (non-emergent condition) 0 Novell, Rieley S. ([id number]) []  - Routine Hospital Admission (non-emergent condition) 0 []  - New Admissions / Biomedical engineer / Ordering NPWT, Apligraf, etc. 0 []  - Emergency Hospital Admission (emergent condition) 0 []  - Simple Discharge Coordination 0 []  - Complex (extensive) Discharge Coordination 0 PROCESS - Special Needs []  - Pediatric / Minor Patient Management 0 []  - Isolation Patient Management 0 []  - Hearing / Language / Visual special needs 0 []  - Assessment of Community assistance (transportation, D/C planning, etc.) 0 []  - Additional assistance / Altered mentation 0 []  - Support Surface(s) Assessment (bed, cushion, seat, etc.) 0 INTERVENTIONS -  Wound Cleansing / Measurement []  - Simple Wound Cleansing - one wound 0 X - Complex Wound Cleansing - multiple wounds 2 5  X - Wound Imaging (photographs - any number of wounds) 1 5 []  - Wound Tracing (instead of photographs) 0 []  - Simple Wound Measurement - one wound 0 X - Complex Wound Measurement - multiple wounds 2 5 INTERVENTIONS - Wound Dressings X - Small Wound Dressing one or multiple wounds 2 10 []  - Medium Wound Dressing one or multiple wounds 0 []  - Large Wound Dressing one or multiple wounds 0 []  - Application of Medications - topical 0 []  - Application of Medications - injection 0 INTERVENTIONS - Miscellaneous []  - External ear exam 0 [name], Kashari S. ([id number]) []  - Specimen Collection (cultures, biopsies, blood, body fluids, etc.) 0 []  - Specimen(s) / Culture(s) sent or taken to Lab for analysis 0 []  - Patient Transfer (multiple staff / Harrel Lemon Lift / Similar devices) 0 []  - Simple Staple / Suture removal (25 or less) 0 []  - Complex Staple / Suture removal (26 or more) 0 []  - Hypo / Hyperglycemic Management (close monitor of Blood Glucose) 0 []  - Ankle / Brachial Index (ABI) - do not check if billed separately 0 X - Vital Signs 1 5 Has the patient been seen at the hospital within the last three years: Yes Total Score: 105 Level Of Care: New/Established - Level 3 Electronic Signature(s) Signed: 06/13/2017 3:26:41 PM By: Regan Lemming [name], RN Entered By: Regan Lemming on 06/13/2017 15:17:58 Hirst, [name] ([id number]) -------------------------------------------------------------------------------- Encounter Discharge Information Details Patient Name: [name]. Date of Service: 06/13/2017 2:45 PM Medical Record Number: [id number] Patient Account Number: [id number] Date of Birth/Sex: 1966-01-30 (51 y.o. Female) Treating RN: Afful, RN, [name], Velva Harman Primary Care Ji Feldner: Delight Stare Other Clinician: Referring Kamyah Wilhelmsen: Delight Stare Treating Jasmain Ahlberg/Extender: Frann Rider in Treatment: 20 Encounter Discharge Information Items Discharge Pain Level: 0 Discharge Condition: Stable Ambulatory Status: Walker Discharge Destination: Home Transportation: Private Auto Accompanied By: Micheline Rough in lobby Schedule Follow-up Appointment: No Medication Reconciliation completed No and provided to Patient/Care Katanya Schlie: Provided on Clinical Summary of Care: 06/13/2017 Form Type Recipient Paper Patient RM Electronic Signature(s) Signed: 06/13/2017 3:24:02 PM By: Ruthine Dose Entered By: Ruthine Dose on 06/13/2017 15:24:02 Denicola, Nishka S. ([id number]) -------------------------------------------------------------------------------- Lower Extremity Assessment Details Patient Name: [name]. Date of Service: 06/13/2017 2:45 PM Medical Record Number: [id number] Patient Account Number: [id number] Date of Birth/Sex: December 24, 1965 (51 y.o. Female) Treating RN: Afful, RN, [name], Velva Harman Primary Care Daxx Tiggs: Delight Stare Other Clinician: Referring Mylee Falin: Delight Stare Treating Hanh Kertesz/Extender: Frann Rider in Treatment: 20 Vascular Assessment Claudication: Claudication Assessment [Right:None] Pulses: Dorsalis Pedis Palpable: [Right:Yes] Posterior Tibial Extremity colors, hair growth, and conditions: Extremity Color: [Right:Mottled] Hair Growth on Extremity: [Right:No] Temperature of Extremity: [Right:Warm] Capillary Refill: [Right:< 3 seconds] Toe Nail Assessment Left: Right: Thick: Yes Discolored: Yes Deformed: Yes Improper Length and Hygiene: Yes Electronic Signature(s) Signed: 06/13/2017 2:50:34 PM By: Regan Lemming [name], RN Entered By: Regan Lemming on 06/13/2017 14:50:34 Seeling, Autym S. ([id number]) -------------------------------------------------------------------------------- Multi Wound Chart Details Patient Name: [name]. Date of Service: 06/13/2017 2:45 PM Medical Record Number: [id number] Patient Account Number:  [id number] Date of Birth/Sex: 10/24/1966 (51 y.o. 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 Comorbid History: Anemia, Arrhythmia, Anemia, Arrhythmia, N/A Coronary Artery Disease, Coronary Artery Disease, Hypotension, Peripheral Hypotension, Peripheral Arterial Disease, Type II Arterial Disease, Type II Diabetes, Osteoarthritis Diabetes, Osteoarthritis Date Acquired: 09/23/2016 09/18/2016 N/A Weeks of Treatment: 20 20 N/A Wound Status: Open Open N/A Measurements L x W x D 1.5x1.5x0.2 2.8x2x1.6 N/A (cm) Area (cm) : 1.767 4.398 N/A Volume (cm) : 0.353 7.037 N/A % Reduction in Area: 55.00% 77.10% N/A % Reduction in Volume: 82.00% 92.70% N/A Classification: Grade 2 Full Thickness Without N/A Exposed Support Structures Exudate Amount: Large Large N/A Exudate Type: Purulent Serous N/A Ingalls, Lawanda S. ([id number]) Exudate Color: yellow, brown, green amber N/A Wound Margin: Distinct, outline attached Distinct, outline attached N/A Granulation Amount: Large (67-100%) Medium (34-66%) N/A Granulation Quality: Pink Red, Pink N/A Necrotic Amount: Small (1-33%) Medium (34-66%) N/A Exposed Structures: Fat Layer (Subcutaneous Fat Layer (Subcutaneous N/A Tissue) Exposed: Yes Tissue) Exposed: Yes Fascia: No Fascia: No Tendon: No Tendon: No Muscle: No Muscle: No Joint: No Joint: No Bone: No Bone: No Epithelialization: None None  N/A Periwound Skin Texture: Induration: Yes No Abnormalities Noted N/A Excoriation: No Callus: No Crepitus: No Rash: No Scarring: No Periwound Skin Maceration: Yes Maceration: Yes N/A Moisture: Dry/Scaly: No Periwound Skin Color: Atrophie Blanche: No Ecchymosis: Yes N/A Cyanosis: No Ecchymosis: No Erythema: No Hemosiderin Staining: No Mottled: No Pallor: No Rubor: No Temperature: No Abnormality No Abnormality N/A Tenderness on Yes Yes N/A Palpation: Wound Preparation: Ulcer Cleansing: Ulcer Cleansing: N/A Rinsed/Irrigated with Rinsed/Irrigated with Saline Saline Topical Anesthetic Topical Anesthetic Applied: Other: lidocaine Applied: Other: lidocaine 4% 4% Treatment Notes Wound #1 (Right, Lateral Malleolus) 1. Cleansed with: Clean wound with Normal Saline 2. Anesthetic Topical Lidocaine 4% cream to wound bed prior to debridement 3. Peri-wound Care: Skin Prep 4. Dressing Applied: YERALDY, SPIKE. ([id number]) Aquacel Ag 5. Secondary Dressing Applied Bordered Foam Dressing Dry Gauze Wound #2 (Right Gluteus) 1. Cleansed with: Clean wound with Normal Saline 2. Anesthetic Topical Lidocaine 4% cream to wound bed prior to debridement 3. Peri-wound Care: Skin Prep 4. Dressing Applied: Aquacel Ag 5. Secondary Dressing Applied Bordered Foam Dressing Dry Gauze Electronic Signature(s) Signed: 06/13/2017 3:50:04 PM By: Christin Fudge MD, FACS Previous Signature: 06/13/2017 3:01:19 PM Version By: Regan Lemming [name], RN Entered By: Christin Fudge on 06/13/2017 15:50:04 Belger, [name] ([id number]) -------------------------------------------------------------------------------- [place] Details Patient Name: SKYLYNNE, SCHLECHTER. Date of Service: 06/13/2017 2:45 PM Medical Record Number: [id number] Patient Account Number: [id number] Date of Birth/Sex: Feb 29, 1966 (51 y.o. Female) Treating RN: Afful, RN, [name], [employer] Primary Care Lakea Mittelman: Delight Stare Other  Clinician: Referring Shweta Aman: Delight Stare Treating Almena Hokenson/Extender: Frann Rider in Treatment: 20 Active Inactive ` Orientation to the Wound Care Program Nursing Diagnoses: Knowledge deficit related to the wound healing center program Goals: Patient/caregiver will verbalize understanding of the [facility] Date Initiated: 01/23/2017 Target Resolution Date: 05/25/2017 Goal Status: Active Interventions: Provide education on orientation to the wound center Notes: ` Peripheral Neuropathy Nursing Diagnoses: Knowledge deficit related to disease process and management of peripheral neurovascular dysfunction Potential alteration in peripheral tissue perfusion (select prior to confirmation of diagnosis) Goals: Patient/caregiver will verbalize understanding of disease process and disease management Date Initiated: 01/23/2017 Target Resolution Date: 05/25/2017 Goal Status: Active Interventions: Assess signs and symptoms of neuropathy upon admission and as needed Provide education on Management of Neuropathy and Related Ulcers Provide education on Management of Neuropathy upon discharge from the [facility] Treatment Activities: Test ordered  outside of clinic : 01/23/2017 Notes: MALYAH, OHLRICH ([id number]) ` Pressure Nursing Diagnoses: Knowledge deficit related to causes and risk factors for pressure ulcer development Knowledge deficit related to management of pressures ulcers Potential for impaired tissue integrity related to pressure, friction, moisture, and shear Goals: Patient will remain free from development of additional pressure ulcers Date Initiated: 01/23/2017 Target Resolution Date: 05/25/2017 Goal Status: Active Patient will remain free of pressure ulcers Date Initiated: 01/23/2017 Target Resolution Date: 05/25/2017 Goal Status: Active Patient/caregiver will verbalize risk factors for pressure ulcer development Date Initiated: 01/23/2017 Target Resolution  Date: 05/25/2017 Goal Status: Active Patient/caregiver will verbalize understanding of pressure ulcer management Date Initiated: 01/23/2017 Target Resolution Date: 05/25/2017 Goal Status: Active Interventions: Assess: immobility, friction, shearing, incontinence upon admission and as needed Assess offloading mechanisms upon admission and as needed Assess potential for pressure ulcer upon admission and as needed Provide education on pressure ulcers Treatment Activities: Patient referred for pressure reduction/relief devices : 01/23/2017 Patient referred for seating evaluation to ensure proper offloading : 01/23/2017 Pressure reduction/relief device ordered : 01/23/2017 Notes: ` Wound/Skin Impairment Nursing Diagnoses: Impaired tissue integrity Knowledge deficit related to smoking impact on wound healing Knowledge deficit related to ulceration/compromised skin integrity Goals: Patient/caregiver will verbalize understanding of skin care regimen CLARIS, PECH ([id number]) Date Initiated: 01/23/2017 Target Resolution Date: 05/25/2017 Goal Status: Active Ulcer/skin breakdown will have a volume reduction of 30% by week 4 Date Initiated: 01/23/2017 Target Resolution Date: 05/25/2017 Goal Status: Active Ulcer/skin breakdown will have a volume reduction of 50% by week 8 Date Initiated: 01/23/2017 Target Resolution Date: 05/25/2017 Goal Status: Active Ulcer/skin breakdown will have a volume reduction of 80% by week 12 Date Initiated: 01/23/2017 Target Resolution Date: 05/25/2017 Goal Status: Active Ulcer/skin breakdown will heal within 14 weeks Date Initiated: 01/23/2017 Target Resolution Date: 05/25/2017 Goal Status: Active Interventions: Assess patient/caregiver ability to obtain necessary supplies Assess patient/caregiver ability to perform ulcer/skin care regimen upon admission and as needed Assess ulceration(s) every visit Provide education on smoking Provide education on ulcer and skin care Treatment  Activities: Patient referred to home care : 01/23/2017 Referred to DME Emanie Behan for dressing supplies : 01/23/2017 Skin care regimen initiated : 01/23/2017 Topical wound management initiated : 01/23/2017 Notes: Electronic Signature(s) Signed: 06/13/2017 3:01:11 PM By: Regan Lemming [name], RN Entered By: Regan Lemming on 06/13/2017 15:01:11 Domeier, [place]. ([id number]) -------------------------------------------------------------------------------- Pain Assessment Details Patient Name: [name]. Date of Service: 06/13/2017 2:45 PM Medical Record Number: [id number] Patient Account Number: [id number] Date of Birth/Sex: 31-Oct-1966 (51 y.o. Female) Treating RN: Afful, RN, [name], Velva Harman Primary Care Hawley Pavia: Delight Stare Other Clinician: Referring Emya Picado: Delight Stare Treating Hallee Mckenny/Extender: Frann Rider in Treatment: 20 Active Problems Location of Pain Severity and Description of Pain Patient Has Paino No Site Locations With Dressing Change: No Pain Management and Medication Current Pain Management: Electronic Signature(s) Signed: 06/13/2017 2:50:00 PM By: Regan Lemming [name], RN Entered By: Regan Lemming on 06/13/2017 14:49:59 [place], [name] ([id number]) -------------------------------------------------------------------------------- Patient/Caregiver Education Details Patient Name: [name]. Date of Service: 06/13/2017 2:45 PM Medical Record Number: [id number] Patient Account Number: [id number] Date of Birth/Gender: [birth year] (51 y.o. Female) Treating RN: Afful, RN, [name], Velva Harman Primary Care Physician: Delight Stare Other Clinician: Referring Physician: Delight Stare Treating Physician/Extender: Frann Rider in Treatment: 20 Education Assessment Education Provided To: Patient Education Topics Provided Peripheral Neuropathy: Methods: Explain/Verbal Responses: Reinforcements needed, State content correctly Pressure: Methods: Explain/Verbal Responses: Reinforcements  needed, State content correctly Smoking and Wound Healing: Methods: Explain/Verbal Responses: Reinforcements  needed Welcome To [facility]: Methods: Explain/Verbal Responses: Reinforcements needed Wound/Skin Impairment: Methods: Explain/Verbal Responses: Reinforcements needed, State content correctly Electronic Signature(s) Signed: 06/13/2017 3:26:41 PM By: Regan Lemming [name], RN Entered By: Regan Lemming on 06/13/2017 15:19:58 Height, Calla Chauncey Cruel ([id number]) -------------------------------------------------------------------------------- Wound Assessment Details Patient Name: [name]. Date of Service: 06/13/2017 2:45 PM Medical Record Number: [id number] Patient Account Number: [id number] Date of Birth/Sex: [birth year] (51 y.o. Female) Treating RN: Afful, RN, [name], [employer] Primary Care Mylo Choi: Delight Stare Other Clinician: Referring Rosco Harriott: Delight Stare Treating Vivianna Piccini/Extender: Frann Rider in Treatment: 20 Wound Status Wound Number: 1 Primary Diabetic Wound/Ulcer of the Lower Etiology: Extremity Wound Location: Right Malleolus - Lateral Wound Open Wounding Event: Gradually Appeared Status: Date Acquired: 09/23/2016 Comorbid Anemia, Arrhythmia, Coronary Artery Weeks Of Treatment: 20 History: Disease, Hypotension, Peripheral Clustered Wound: No Arterial Disease, Type II Diabetes, Osteoarthritis Photos Photo Uploaded By: Regan Lemming on 06/13/2017 15:31:59 Wound Measurements Length: (cm) 1.5 Width: (cm) 1.5 Depth: (cm) 0.2 Area: (cm) 1.767 Volume: (cm) 0.353 % Reduction in Area: 55% % Reduction in Volume: 82% Epithelialization: None Tunneling: No Undermining: No Wound Description Classification: Grade 2 Foul Odor Aft Wound Margin: Distinct, outline attached Slough/Fibrin Exudate Amount: Large Exudate Type: Purulent Exudate Color: yellow, brown, green er Cleansing: No o Yes Wound Bed Granulation Amount: Large (67-100%) Exposed  Structure Granulation Quality: Pink Fascia Exposed: No Necrotic Amount: Small (1-33%) Fat Layer (Subcutaneous Tissue) Exposed: Yes Kokesh, [name] S. ([id number]) Necrotic Quality: Adherent Slough Tendon Exposed: No Muscle Exposed: No Joint Exposed: No Bone Exposed: No Periwound Skin Texture Texture Color No Abnormalities Noted: No No Abnormalities Noted: No Callus: No Atrophie Blanche: No Crepitus: No Cyanosis: No Excoriation: No Ecchymosis: No Induration: Yes Erythema: No Rash: No Hemosiderin Staining: No Scarring: No Mottled: No Pallor: No Moisture Rubor: No No Abnormalities Noted: No Dry / Scaly: No Temperature / Pain Maceration: Yes Temperature: No Abnormality Tenderness on Palpation: Yes Wound Preparation Ulcer Cleansing: Rinsed/Irrigated with Saline Topical Anesthetic Applied: Other: lidocaine 4%, Treatment Notes Wound #1 (Right, Lateral Malleolus) 1. Cleansed with: Clean wound with Normal Saline 2. Anesthetic Topical Lidocaine 4% cream to wound bed prior to debridement 3. Peri-wound Care: Skin Prep 4. Dressing Applied: Aquacel Ag 5. Secondary Dressing Applied Bordered Foam Dressing Dry Gauze Electronic Signature(s) Signed: 06/13/2017 3:00:21 PM By: Regan Lemming [name], RN Entered By: Regan Lemming on 06/13/2017 15:00:21 [place]enger, [name] ([id number]) -------------------------------------------------------------------------------- Wound Assessment Details Patient Name: [name]. Date of Service: 06/13/2017 2:45 PM Medical Record Number: [id number] Patient Account Number: [id number] Date of Birth/Sex: 1966-01-05 (51 y.o. Female) Treating RN: Afful, RN, [name], [employer] Primary Care Amiree No: Delight Stare Other Clinician: Referring Donika Butner: Delight Stare Treating Rayven Rettig/Extender: Frann Rider in Treatment: 20 Wound Status Wound Number: 2 Primary Open Surgical Wound Etiology: Wound Location: Right Gluteus Wound Open Wounding Event: Gradually  Appeared Status: Date Acquired: 09/18/2016 Comorbid Anemia, Arrhythmia, Coronary Artery Weeks Of Treatment: 20 History: Disease, Hypotension, Peripheral Clustered Wound: No Arterial Disease, Type II Diabetes, Osteoarthritis Photos Photo Uploaded By: Regan Lemming on 06/13/2017 15:32:00 Wound Measurements Length: (cm) 2.8 Width: (cm) 2 Depth: (cm) 1.6 Area: (cm) 4.398 Volume: (cm) 7.037 % Reduction in Area: 77.1% % Reduction in Volume: 92.7% Epithelialization: None Tunneling: No Undermining: No Wound Description Full Thickness Without Exposed Foul Odor Aft Classification: Support Structures Slough/Fibrin Wound Margin: Distinct, outline attached Exudate Large Amount: Exudate Type: Serous Exudate Color: amber er Cleansing: No o No Wound Bed Granulation Amount: Medium (34-66%) Exposed Structure Bracken, [place]. ([id number])  Granulation Quality: Red, Pink Fascia Exposed: No Necrotic Amount: Medium (34-66%) Fat Layer (Subcutaneous Tissue) Exposed: Yes Necrotic Quality: Adherent Slough Tendon Exposed: No Muscle Exposed: No Joint Exposed: No Bone Exposed: No Periwound Skin Texture Texture Color No Abnormalities Noted: No No Abnormalities Noted: No Ecchymosis: Yes Moisture No Abnormalities Noted: No Temperature / Pain Maceration: Yes Temperature: No Abnormality Tenderness on Palpation: Yes Wound Preparation Ulcer Cleansing: Rinsed/Irrigated with Saline Topical Anesthetic Applied: Other: lidocaine 4%, Treatment Notes Wound #2 (Right Gluteus) 1. Cleansed with: Clean wound with Normal Saline 2. Anesthetic Topical Lidocaine 4% cream to wound bed prior to debridement 3. Peri-wound Care: Skin Prep 4. Dressing Applied: Aquacel Ag 5. Secondary Dressing Applied Bordered Foam Dressing Dry Gauze Electronic Signature(s) Signed: 06/13/2017 3:00:54 PM By: Regan Lemming [name], RN Entered By: Regan Lemming on 06/13/2017 15:00:53 Biela, [name]  ([id number]) -------------------------------------------------------------------------------- [place] Details Patient Name: [name]. Date of Service: 06/13/2017 2:45 PM Medical Record Number: [id number] Patient Account Number: [id number] Date of Birth/Sex: 07/18/1966 (51 y.o. Female) Treating RN: Afful, RN, [name], Velva Harman Primary Care Jaylani Mcguinn: Delight Stare Other Clinician: Referring Lakresha Stifter: Delight Stare Treating Suly Vukelich/Extender: Frann Rider in Treatment: 20 Vital Signs Time Taken: 14:56 Temperature (F): 97.8 Height (in): 67 Pulse (bpm): 93 Weight (lbs): 137 Respiratory Rate (breaths/min): 16 Body Mass Index (BMI): 21.5 Blood Pressure (mmHg): 90/62 Reference Range: 80 - 120 mg / dl Electronic Signature(s) Signed: 06/13/2017 2:59:56 PM By: Regan Lemming [name], RN Entered By: Regan Lemming on 06/13/2017 14:59:56

## 2017-06-20 ENCOUNTER — Encounter: Payer: Medicare Other | Attending: Surgery | Admitting: Surgery

## 2017-06-20 DIAGNOSIS — M86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, right ankle and foot: Secondary | ICD-10-CM | POA: Diagnosis not present

## 2017-06-20 DIAGNOSIS — E1140 Type 2 diabetes mellitus with diabetic neuropathy, unspecified: Secondary | ICD-10-CM | POA: Insufficient documentation

## 2017-06-20 DIAGNOSIS — E1151 Type 2 diabetes mellitus with diabetic peripheral angiopathy without gangrene: Secondary | ICD-10-CM | POA: Insufficient documentation

## 2017-06-20 DIAGNOSIS — E11622 Type 2 diabetes mellitus with other skin ulcer: Secondary | ICD-10-CM | POA: Diagnosis not present

## 2017-06-20 DIAGNOSIS — I70233 Atherosclerosis of native arteries of right leg with ulceration of ankle: Secondary | ICD-10-CM | POA: Diagnosis not present

## 2017-06-20 DIAGNOSIS — L97312 Non-pressure chronic ulcer of right ankle with fat layer exposed: Secondary | ICD-10-CM | POA: Insufficient documentation

## 2017-06-20 DIAGNOSIS — F17210 Nicotine dependence, cigarettes, uncomplicated: Secondary | ICD-10-CM | POA: Insufficient documentation

## 2017-06-20 DIAGNOSIS — L89314 Pressure ulcer of right buttock, stage 4: Secondary | ICD-10-CM | POA: Insufficient documentation

## 2017-06-22 NOTE — Progress Notes (Signed)
ZIYAN, HILLMER ([id number]) Visit Report for 06/20/2017 Arrival Information Details Patient Name: [name], [name]. Date of Service: 06/20/2017 3:30 PM Medical Record Number: [id number] Patient Account Number: [id number] Date of Birth/Sex: 02-07-1966 (51 y.o. Female) Treating RN: Cornell Barman Primary Care Champion Corales: Delight Stare Other Clinician: Referring Ariam Mol: Delight Stare Treating Clemencia Helzer/Extender: Frann Rider in Treatment: 21 Visit Information History Since Last Visit Added or deleted any medications: No Patient Arrived: [place] Any new allergies or adverse reactions: No Arrival Time: 15:29 Had a fall or experienced change in No Accompanied By: self activities of daily living that may affect Transfer Assistance: None risk of falls: Patient Identification Verified: Yes Signs or symptoms of abuse/neglect since last No Secondary Verification Process Completed: Yes visito Patient Requires Transmission-Based No Hospitalized since last visit: No Precautions: Has Dressing in Place as Prescribed: Yes Patient Has Alerts: No Pain Present Now: No Electronic Signature(s) Signed: 06/20/2017 4:26:40 PM By: Gretta Cool, [name], RN, CWS, [name], [name] Entered By: Gretta Cool, [name], RN, CWS, Kim on 06/20/2017 15:30:07 Damiani, [name] ([id number]) -------------------------------------------------------------------------------- Clinic Level of Care Assessment Details Patient Name: [name], [name]. Date of Service: 06/20/2017 3:30 PM Medical Record Number: [id number] Patient Account Number: [id number] Date of Birth/Sex: 1965-12-25 (51 y.o. Female) Treating RN: Cornell Barman Primary Care Atarah Cadogan: Delight Stare Other Clinician: Referring Odie Rauen: Delight Stare Treating Arina Torry/Extender: Frann Rider in Treatment: 21 Clinic Level of Care Assessment Items TOOL 4 Quantity Score []  - Use when only an EandM is performed on FOLLOW-UP visit 0 ASSESSMENTS - Nursing Assessment / Reassessment []  -  Reassessment of Co-morbidities (includes updates in patient status) 0 X - Reassessment of Adherence to Treatment Plan 1 5 ASSESSMENTS - Wound and Skin Assessment / Reassessment []  - Simple Wound Assessment / Reassessment - one wound 0 X - Complex Wound Assessment / Reassessment - multiple wounds 2 5 []  - Dermatologic / Skin Assessment (not related to wound area) 0 ASSESSMENTS - Focused Assessment []  - Circumferential Edema Measurements - multi extremities 0 []  - Nutritional Assessment / Counseling / Intervention 0 []  - Lower Extremity Assessment (monofilament, tuning fork, pulses) 0 []  - Peripheral Arterial Disease Assessment (using hand held doppler) 0 ASSESSMENTS - Ostomy and/or Continence Assessment and Care []  - Incontinence Assessment and Management 0 []  - Ostomy Care Assessment and Management (repouching, etc.) 0 PROCESS - Coordination of Care X - Simple Patient / Family Education for ongoing care 1 15 []  - Complex (extensive) Patient / Family Education for ongoing care 0 X - Staff obtains Programmer, systems, Records, Test Results / Process Orders 1 10 []  - Staff telephones HHA, Nursing Homes / Clarify orders / etc 0 []  - Routine Transfer to another Facility (non-emergent condition) 0 Harig, Finnlee S. ([id number]) []  - Routine Hospital Admission (non-emergent condition) 0 []  - New Admissions / Biomedical engineer / Ordering NPWT, Apligraf, etc. 0 []  - Emergency Hospital Admission (emergent condition) 0 X - Simple Discharge Coordination 1 10 []  - Complex (extensive) Discharge Coordination 0 PROCESS - Special Needs []  - Pediatric / Minor Patient Management 0 []  - Isolation Patient Management 0 []  - Hearing / Language / Visual special needs 0 []  - Assessment of Community assistance (transportation, D/C planning, etc.) 0 []  - Additional assistance / Altered mentation 0 []  - Support Surface(s) Assessment (bed, cushion, seat, etc.) 0 INTERVENTIONS - Wound Cleansing / Measurement []  -  Simple Wound Cleansing - one wound 0 X - Complex Wound Cleansing - multiple wounds 2 5 X - Wound Imaging (photographs - any  number of wounds) 1 5 []  - Wound Tracing (instead of photographs) 0 []  - Simple Wound Measurement - one wound 0 X - Complex Wound Measurement - multiple wounds 2 5 INTERVENTIONS - Wound Dressings []  - Small Wound Dressing one or multiple wounds 0 X - Medium Wound Dressing one or multiple wounds 2 15 []  - Large Wound Dressing one or multiple wounds 0 []  - Application of Medications - topical 0 []  - Application of Medications - injection 0 INTERVENTIONS - Miscellaneous []  - External ear exam 0 Villavicencio, Chariti S. ([id number]) []  - Specimen Collection (cultures, biopsies, blood, body fluids, etc.) 0 []  - Specimen(s) / Culture(s) sent or taken to Lab for analysis 0 []  - Patient Transfer (multiple staff / Harrel Lemon Lift / Similar devices) 0 []  - Simple Staple / Suture removal (25 or less) 0 []  - Complex Staple / Suture removal (26 or more) 0 []  - Hypo / Hyperglycemic Management (close monitor of Blood Glucose) 0 []  - Ankle / Brachial Index (ABI) - do not check if billed separately 0 X - Vital Signs 1 5 Has the patient been seen at the hospital within the last three years: Yes Total Score: 110 Level Of Care: New/Established - Level 3 Electronic Signature(s) Signed: 06/20/2017 4:26:40 PM By: Gretta Cool, [name], RN, CWS, [name], [name] Entered By: Gretta Cool, [name], RN, CWS, Kim on 06/20/2017 16:26:01 Sierra Brooks, [name] ([id number]) -------------------------------------------------------------------------------- Encounter Discharge Information Details Patient Name: [name], [name]. Date of Service: 06/20/2017 3:30 PM Medical Record Number: [id number] Patient Account Number: [id number] Date of Birth/Sex: Feb 17, 1966 (51 y.o. Female) Treating RN: Cornell Barman Primary Care Lisette Mancebo: Delight Stare Other Clinician: Referring Shifra Swartzentruber: Delight Stare Treating Diany Formosa/Extender: Frann Rider in  Treatment: 21 Encounter Discharge Information Items Schedule Follow-up Appointment: No Medication Reconciliation completed and No provided to Patient/Care Rc [place]: Patient Clinical Summary of Care: Declined Electronic Signature(s) Signed: 06/20/2017 3:56:57 PM By: Ruthine Dose Entered By: Ruthine Dose on 06/20/2017 15:56:57 Heinle, Jumanah S. ([id number]) -------------------------------------------------------------------------------- Lower Extremity Assessment Details Patient Name: [name] Date of Service: 06/20/2017 3:30 PM Medical Record Number: [id number] Patient Account Number: [id number] Date of Birth/Sex: [birth year] (51 y.o. Female) Treating RN: Cornell Barman Primary Care Kolyn Rozario: Delight Stare Other Clinician: Referring Massie Mees: Delight Stare Treating Maiko Salais/Extender: Frann Rider in Treatment: 21 Vascular Assessment Pulses: Dorsalis Pedis Palpable: [Right:Yes] Posterior Tibial Extremity colors, hair growth, and conditions: Extremity Color: [Right:Normal] Hair Growth on Extremity: [Right:No] Temperature of Extremity: [Right:Warm] Capillary Refill: [Right:< 3 seconds] Toe Nail Assessment Left: Right: Thick: No Discolored: No Deformed: No Improper Length and Hygiene: No Electronic Signature(s) Signed: 06/20/2017 4:26:40 PM By: Gretta Cool, [name], RN, CWS, [name], [name] Entered By: Gretta Cool, [name], RN, CWS, Kim on 06/20/2017 15:42:17 Appleby, [name] ([id number]) -------------------------------------------------------------------------------- Multi Wound Chart Details Patient Name: [name] Date of Service: 06/20/2017 3:30 PM Medical Record Number: [id number] Patient Account Number: [id number] Date of Birth/Sex: January 21, 1966 (51 y.o. Female) Treating RN: Cornell Barman Primary Care Mysha Peeler: Delight Stare Other Clinician: Referring Leeah Politano: Delight Stare Treating Khallid Pasillas/Extender: Frann Rider in Treatment: 21 Vital Signs Height(in): 67 Pulse(bpm):  102 Weight(lbs): 137 Blood Pressure (mmHg): Body Mass Index(BMI): 21 Temperature(F): 98.1 Respiratory Rate 16 (breaths/min): Photos: [1:No Photos] [2:No Photos] [N/A:N/A] Wound Location: [1:Right Malleolus - Lateral] [2:Right Gluteus] [N/A:N/A] Wounding Event: [1:Gradually Appeared] [2:Gradually Appeared] [N/A:N/A] Primary Etiology: [1:Diabetic Wound/Ulcer of the Lower Extremity] [2:Open Surgical Wound] [N/A:N/A] Comorbid History: [1:Anemia, Arrhythmia, Coronary Artery Disease, Hypotension, Peripheral Arterial Disease, Type II Diabetes, Osteoarthritis] [2:Anemia, Arrhythmia, Coronary Artery Disease, Hypotension, Peripheral  Arterial Disease, Type II Diabetes,  Osteoarthritis] [N/A:N/A] Date Acquired: [1:09/23/2016] [2:09/18/2016] [N/A:N/A] Weeks of Treatment: [1:21] [2:21] [N/A:N/A] Wound Status: [1:Open] [2:Open] [N/A:N/A] Measurements L x W x D 1.2x1.5x0.2 [2:1.5x1x1.2] [N/A:N/A] (cm) Area (cm) : [1:1.414] [2:1.178] [N/A:N/A] Volume (cm) : [1:0.283] [2:1.414] [N/A:N/A] % Reduction in Area: [1:64.00%] [2:93.90%] [N/A:N/A] % Reduction in Volume: 85.60% [2:98.50%] [N/A:N/A] Starting Position 1 9 (o'clock): Ending Position 1 [1:3] (o'clock): Maximum Distance 1 0.4 (cm): Undermining: [1:Yes] [2:N/A] [N/A:N/A] Classification: [1:Grade 2] [2:Full Thickness Without Exposed Support Structures] [N/A:N/A] Exudate Amount: [1:Large] [2:Large] [N/A:N/A] Exudate Type: Serosanguineous Serous N/A Exudate Color: red, brown amber N/A Wound Margin: Distinct, outline attached Distinct, outline attached N/A Granulation Amount: Large (67-100%) Medium (34-66%) N/A Granulation Quality: Pink Red, Pink N/A Necrotic Amount: Small (1-33%) Medium (34-66%) N/A Exposed Structures: Fat Layer (Subcutaneous Fat Layer (Subcutaneous N/A Tissue) Exposed: Yes Tissue) Exposed: Yes Fascia: No Fascia: No Tendon: No Tendon: No Muscle: No Muscle: No Joint: No Joint: No Bone: No Bone:  No Epithelialization: None None N/A Periwound Skin Texture: Induration: Yes No Abnormalities Noted N/A Excoriation: No Callus: No Crepitus: No Rash: No Scarring: No Periwound Skin Maceration: Yes Maceration: Yes N/A Moisture: Dry/Scaly: No Periwound Skin Color: Erythema: Yes Ecchymosis: Yes N/A Atrophie Blanche: No Cyanosis: No Ecchymosis: No Hemosiderin Staining: No Mottled: No Pallor: No Rubor: No Erythema Location: Circumferential N/A N/A Temperature: No Abnormality No Abnormality N/A Tenderness on Yes Yes N/A Palpation: Wound Preparation: Ulcer Cleansing: Ulcer Cleansing: N/A Rinsed/Irrigated with Rinsed/Irrigated with Saline Saline Topical Anesthetic Topical Anesthetic Applied: Other: lidocaine Applied: Other: lidocaine 4% 4% Treatment Notes Electronic Signature(s) Signed: 06/20/2017 3:49:25 PM By: Christin Fudge MD, FACS Entered By: Christin Fudge on 06/20/2017 15:49:25 Vallez, Mykia Chauncey Cruel ([id number]) -------------------------------------------------------------------------------- [place] Details Patient Name: [name], [name]. Date of Service: 06/20/2017 3:30 PM Medical Record Number: [id number] Patient Account Number: [id number] Date of Birth/Sex: Apr 08, 1966 (51 y.o. Female) Treating RN: Cornell Barman Primary Care Lira Stephen: Delight Stare Other Clinician: Referring Mahdiya Mossberg: Delight Stare Treating Anarie Kalish/Extender: Frann Rider in Treatment: 21 Active Inactive ` Orientation to the Wound Care Program Nursing Diagnoses: Knowledge deficit related to the wound healing center program Goals: Patient/caregiver will verbalize understanding of the [facility] Program Date Initiated: 01/23/2017 Target Resolution Date: 05/25/2017 Goal Status: Active Interventions: Provide education on orientation to the wound center Notes: ` Peripheral Neuropathy Nursing Diagnoses: Knowledge deficit related to disease process and management of peripheral  neurovascular dysfunction Potential alteration in peripheral tissue perfusion (select prior to confirmation of diagnosis) Goals: Patient/caregiver will verbalize understanding of disease process and disease management Date Initiated: 01/23/2017 Target Resolution Date: 05/25/2017 Goal Status: Active Interventions: Assess signs and symptoms of neuropathy upon admission and as needed Provide education on Management of Neuropathy and Related Ulcers Provide education on Management of Neuropathy upon discharge from the [facility] Activities: Test ordered outside of clinic : 01/23/2017 Notes: DELYLAH, STANCZYK ([id number]) ` Pressure Nursing Diagnoses: Knowledge deficit related to causes and risk factors for pressure ulcer development Knowledge deficit related to management of pressures ulcers Potential for impaired tissue integrity related to pressure, friction, moisture, and shear Goals: Patient will remain free from development of additional pressure ulcers Date Initiated: 01/23/2017 Target Resolution Date: 05/25/2017 Goal Status: Active Patient will remain free of pressure ulcers Date Initiated: 01/23/2017 Target Resolution Date: 05/25/2017 Goal Status: Active Patient/caregiver will verbalize risk factors for pressure ulcer development Date Initiated: 01/23/2017 Target Resolution Date: 05/25/2017 Goal Status: Active Patient/caregiver will verbalize understanding of pressure ulcer management Date Initiated: 01/23/2017  Target Resolution Date: 05/25/2017 Goal Status: Active Interventions: Assess: immobility, friction, shearing, incontinence upon admission and as needed Assess offloading mechanisms upon admission and as needed Assess potential for pressure ulcer upon admission and as needed Provide education on pressure ulcers Treatment Activities: Patient referred for pressure reduction/relief devices : 01/23/2017 Patient referred for seating evaluation to ensure proper offloading :  01/23/2017 Pressure reduction/relief device ordered : 01/23/2017 Notes: ` Wound/Skin Impairment Nursing Diagnoses: Impaired tissue integrity Knowledge deficit related to smoking impact on wound healing Knowledge deficit related to ulceration/compromised skin integrity Goals: Patient/caregiver will verbalize understanding of skin care regimen GRACEANN, BOILEAU ([id number]) Date Initiated: 01/23/2017 Target Resolution Date: 05/25/2017 Goal Status: Active Ulcer/skin breakdown will have a volume reduction of 30% by week 4 Date Initiated: 01/23/2017 Target Resolution Date: 05/25/2017 Goal Status: Active Ulcer/skin breakdown will have a volume reduction of 50% by week 8 Date Initiated: 01/23/2017 Target Resolution Date: 05/25/2017 Goal Status: Active Ulcer/skin breakdown will have a volume reduction of 80% by week 12 Date Initiated: 01/23/2017 Target Resolution Date: 05/25/2017 Goal Status: Active Ulcer/skin breakdown will heal within 14 weeks Date Initiated: 01/23/2017 Target Resolution Date: 05/25/2017 Goal Status: Active Interventions: Assess patient/caregiver ability to obtain necessary supplies Assess patient/caregiver ability to perform ulcer/skin care regimen upon admission and as needed Assess ulceration(s) every visit Provide education on smoking Provide education on ulcer and skin care Treatment Activities: Patient referred to home care : 01/23/2017 Referred to DME Kyndel Egger for dressing supplies : 01/23/2017 Skin care regimen initiated : 01/23/2017 Topical wound management initiated : 01/23/2017 Notes: Electronic Signature(s) Signed: 06/20/2017 4:26:40 PM By: Gretta Cool, [name], RN, CWS, [name], [name] Entered By: Gretta Cool, [name], RN, CWS, Kim on 06/20/2017 15:42:23 Jorge, [name] ([id number]) -------------------------------------------------------------------------------- Pain Assessment Details Patient Name: CHABLIS, LOSH. Date of Service: 06/20/2017 3:30 PM Medical Record Number: [id number] Patient Account  Number: [id number] Date of Birth/Sex: [birth year] (51 y.o. Female) Treating RN: Cornell Barman Primary Care Tiaunna Buford: Delight Stare Other Clinician: Referring Chidubem Chaires: Delight Stare Treating Leibish Mcgregor/Extender: Frann Rider in Treatment: 21 Active Problems Location of Pain Severity and Description of Pain Patient Has Paino No Site Locations With Dressing Change: No Pain Management and Medication Current Pain Management: Electronic Signature(s) Signed: 06/20/2017 4:26:40 PM By: Gretta Cool, [name], RN, CWS, [name], [name] Entered By: Gretta Cool, [name], RN, CWS, Kim on 06/20/2017 15:30:14 Tetrault, [name] ([id number]) -------------------------------------------------------------------------------- Wound Assessment Details Patient Name: [name], [name]. Date of Service: 06/20/2017 3:30 PM Medical Record Number: [id number] Patient Account Number: [id number] Date of Birth/Sex: 1966/03/20 (51 y.o. Female) Treating RN: Cornell Barman Primary Care Minta Fair: Delight Stare Other Clinician: Referring Oluwatomisin Deman: Delight Stare Treating Meridith Romick/Extender: Frann Rider in Treatment: 21 Wound Status Wound Number: 1 Primary Diabetic Wound/Ulcer of the Lower Etiology: Extremity Wound Location: Right Malleolus - Lateral Wound Open Wounding Event: Gradually Appeared Status: Date Acquired: 09/23/2016 Comorbid Anemia, Arrhythmia, Coronary Artery Weeks Of Treatment: 21 History: Disease, Hypotension, Peripheral Clustered Wound: No Arterial Disease, Type II Diabetes, Osteoarthritis Photos Photo Uploaded By: Gretta Cool, [name], RN, CWS, Kim on 06/20/2017 16:58:55 Wound Measurements Length: (cm) 1.2 Width: (cm) 1.5 Depth: (cm) 0.2 Area: (cm) 1.414 Volume: (cm) 0.283 % Reduction in Area: 64% % Reduction in Volume: 85.6% Epithelialization: None Tunneling: No Undermining: Yes Starting Position (o'clock): 9 Ending Position (o'clock): 3 Maximum Distance: (cm) 0.4 Wound Description Classification: Grade 2 Wound Margin:  Distinct, outline attached Exudate Amount: Large Exudate Type: Serosanguineous Exudate Color: red, brown Foul Odor After Cleansing: No Slough/Fibrino Yes Wound Bed Granulation Amount: Large (67-100%) Exposed  Structure Hollenbaugh, Nissi S. ([id number]) Granulation Quality: Pink Fascia Exposed: No Necrotic Amount: Small (1-33%) Fat Layer (Subcutaneous Tissue) Exposed: Yes Necrotic Quality: Adherent Slough Tendon Exposed: No Muscle Exposed: No Joint Exposed: No Bone Exposed: No Periwound Skin Texture Texture Color No Abnormalities Noted: No No Abnormalities Noted: No Callus: No Atrophie Blanche: No Crepitus: No Cyanosis: No Excoriation: No Ecchymosis: No Induration: Yes Erythema: Yes Rash: No Erythema Location: Circumferential Scarring: No Hemosiderin Staining: No Mottled: No Moisture Pallor: No No Abnormalities Noted: No Rubor: No Dry / Scaly: No Maceration: Yes Temperature / Pain Temperature: No Abnormality Tenderness on Palpation: Yes Wound Preparation Ulcer Cleansing: Rinsed/Irrigated with Saline Topical Anesthetic Applied: Other: lidocaine 4%, Electronic Signature(s) Signed: 06/20/2017 4:26:40 PM By: Gretta Cool, [name], RN, CWS, [name], [name] Entered By: Gretta Cool, [name], RN, CWS, Kim on 06/20/2017 15:35:49 Bilbo, [name] ([id number]) -------------------------------------------------------------------------------- Wound Assessment Details Patient Name: [name], [name]. Date of Service: 06/20/2017 3:30 PM Medical Record Number: [id number] Patient Account Number: [id number] Date of Birth/Sex: Aug 24, 1966 (51 y.o. Female) Treating RN: Cornell Barman Primary Care Jemeka Wagler: Delight Stare Other Clinician: Referring Osmany Azer: Delight Stare Treating [name] [name]/Extender: Frann Rider in Treatment: 21 Wound Status Wound Number: 2 Primary Open Surgical Wound Etiology: Wound Location: Right Gluteus Wound Open Wounding Event: Gradually Appeared Status: Date Acquired:  09/18/2016 Comorbid Anemia, Arrhythmia, Coronary Artery Weeks Of Treatment: 21 History: Disease, Hypotension, Peripheral Clustered Wound: No Arterial Disease, Type II Diabetes, Osteoarthritis Photos Photo Uploaded By: Gretta Cool, [name], RN, CWS, Kim on 06/20/2017 16:58:55 Wound Measurements Length: (cm) 1.5 Width: (cm) 1 Depth: (cm) 1.2 Area: (cm) 1.178 Volume: (cm) 1.414 % Reduction in Area: 93.9% % Reduction in Volume: 98.5% Epithelialization: None Wound Description Full Thickness Without Exposed Classification: Support Structures Wound Margin: Distinct, outline attached Exudate Large Amount: Exudate Type: Serous Exudate Color: amber Foul Odor After Cleansing: No Slough/Fibrino No Wound Bed Granulation Amount: Medium (34-66%) Exposed Structure Granulation Quality: Red, Pink Fascia Exposed: No Necrotic Amount: Medium (34-66%) Fat Layer (Subcutaneous Tissue) Exposed: Yes Stingley, Giuliana S. ([id number]) Necrotic Quality: Adherent Slough Tendon Exposed: No Muscle Exposed: No Joint Exposed: No Bone Exposed: No Periwound Skin Texture Texture Color No Abnormalities Noted: No No Abnormalities Noted: No Ecchymosis: Yes Moisture No Abnormalities Noted: No Temperature / Pain Maceration: Yes Temperature: No Abnormality Tenderness on Palpation: Yes Wound Preparation Ulcer Cleansing: Rinsed/Irrigated with Saline Topical Anesthetic Applied: Other: lidocaine 4%, Electronic Signature(s) Signed: 06/20/2017 4:26:40 PM By: Gretta Cool, [name], RN, CWS, [name], [name] Entered By: Gretta Cool, [name], RN, CWS, Kim on 06/20/2017 15:36:58 Scarber, [name] ([id number]) -------------------------------------------------------------------------------- [place] Details Patient Name: [name], [name]. Date of Service: 06/20/2017 3:30 PM Medical Record Number: [id number] Patient Account Number: [id number] Date of Birth/Sex: 12/19/1965 (51 y.o. Female) Treating RN: Cornell Barman Primary Care Nathanal Hermiz: Delight Stare Other Clinician: Referring Kerly Rigsbee: Delight Stare Treating Demontrae Gilbert/Extender: Frann Rider in Treatment: 21 Vital Signs Time Taken: 15:30 Temperature (F): 98.1 Height (in): 67 Pulse (bpm): 102 Weight (lbs): 137 Respiratory Rate (breaths/min): 16 Body Mass Index (BMI): 21.5 Reference Range: 80 - 120 mg / dl Electronic Signature(s) Signed: 06/20/2017 4:26:40 PM By: Gretta Cool, [name], RN, CWS, [name], [name] Entered By: Gretta Cool, [name], RN, CWS, Kim on 06/20/2017 15:31:14

## 2017-06-23 NOTE — Progress Notes (Addendum)
[name] ([id number]) Visit Report for 06/20/2017 Chief Complaint Document Details Patient Name: [name]. Date of Service: 06/20/2017 3:30 PM Medical Record Number: [id number] Patient Account Number: [id number] Date of Birth/Sex: [birth year] (51 y.o. Female) Treating RN: Cornell Barman Primary Care Provider: Delight Stare Other Clinician: Referring Provider: Delight Stare Treating Provider/Extender: Frann Rider in Treatment: 21 Information Obtained from: Patient Chief Complaint Patients presents for treatment of an open diabetic ulcer with an arterial etiology on her right lateral ankle and right gluteal area Electronic Signature(s) Signed: 06/20/2017 3:49:32 PM By: Christin Fudge MD, FACS Entered By: Christin Fudge on 06/20/2017 15:49:32 Spang, Struble. ([id number]) -------------------------------------------------------------------------------- HPI Details Patient Name: [name]. Date of Service: 06/20/2017 3:30 PM Medical Record Number: [id number] Patient Account Number: [id number] Date of Birth/Sex: October 28, 1966 (51 y.o. Female) Treating RN: Cornell Barman Primary Care Provider: Delight Stare Other Clinician: Referring Provider: Delight Stare Treating Provider/Extender: Frann Rider in Treatment: 21 History of Present Illness Location: right lateral ankle and right gluteal area Quality: Patient reports experiencing a sharp pain to affected area(s). Severity: Patient states wound are getting better slowly Duration: Patient has had the wound for > 4 months prior to seeking treatment at the wound center Timing: Pain in wound is constant (hurts all the time) Context: The wound occurred when the patient was in hospital with a necrotizing fasciitis of the right gluteal area and a ulceration on the right ankle Modifying Factors: Other treatment(s) tried include:admitted to the hospital for IV antibiotics and a full workup and has also had a recent angiogram Associated  Signs and Symptoms: Patient reports having increase discharge. HPI Description: 51 year old patient was sent to [place] from [facility] where she was seen by Dr. Sherril Cong for a left ankle ulceration and was recently hospitalized with hypotension and sepsis. She was treated with IV antibiotics and has been scheduled to see Dr. Sharol Given. She was seen by vascular surgery who recommended a femoral bypass but surgery has been delayed until her sacral wound from last year's necrotizing fasciitis has healed. Was seeing the wound care team at [facility] but wanted to change over. She is a smoker and smokes a pack of cigarettes a day The patient was recently admitted in [place] between February 2 and February 14. She had a follow- up to see vascular surgery, orthopedic surgery and infectious disease. 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 Review of the electronic medical records reveals that Dr. 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 An MRI showed edema in the bone which was consistent with osteomyelitis and the chronic ulcer and may need surgical intervention. She also had a sacral decubitus ulcer which was treated with the wound VAC. On 01/07/2017 she was taken up by the vascular surgeon Dr. Trula Slade for an abdominal aortogram and bilateral lower extremity runoff, for a history of having bilateral femoropopliteal bypass graft as well as external iliac stenting on the right and stenting of her bypass graft. She had developed a nonhealing wound on her right ankle and  there was a possibility of a femoral occlusion. the findings were noted and the  impression was a surgical revascularization with a aorto bifemoral bypass graft. Both the femoropopliteal bypass grafts were patent. 01/31/2017 -- x-ray of the right hip and pelvis -- IMPRESSION:No radiographic evidence of acute osteomyelitis. Normal-appearing right hip joint space for age. No acute bony abnormality of the hip. 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 Since seeing her last week she has not had an appointment with infectious disease or the orthopedic specialist yet. [name], [name] ([id number]) 02/06/2017 -- the patient missed a couple of appointments yesterday due to the weather but other than that has apparently given up smoking for the last 5 days. 02/13/2017 -- she has rescheduled her infectious disease appointment and also the appointment with the orthopedic surgeon Dr. Sharol Given 03/06/2017 -- she was seen by Dr. Lucianne Lei dam of infectious disease on 03/05/2017 and he recommendedIV antibiotics but the patient did not want to have that and he has given her Augmentin and doxycycline and will reevaluate her in 2 months time. 03/13/2017 -- he saw Dr. Trula Slade regarding her vascular issues and he would like to wait to the sacral wound is completely closed before considering aortobifemoral bypass graft. He will see her back in 2 months time. She was also seen by Dr. Sharol Given of orthopedics who recommended continue wound care dressings and follow in the office in about 2 months time. he also recommended 3 view radiographs of the right ankle at follow-up. 03/28/2017 -- she did have a PICC line placed and Dr. Lucianne Lei dam has begun IV vancomycin and ceftriaxone. she is going to continue this until she sees him in approximately a month's time 04/18/2017 -- we had applied for a skin substitute for the patient's care but her copayment is going to be about $300 a  piece and the patient will not be able to afford this. 05/08/17 on evaluation today patient appears to potentially have a fungal rash in the periwound region of the sacral area. This also seems to extend into the inguinal creases and factional region as well. She is tender to palpation with light rubbing over the region of the periwound and the skin in this region does have a beefy red appearance. Everything seems to point to a fungal infection at this time. She has been tolerating the dressing changes up to this point. There is no evidence of infection otherwise. 05/16/2017 -- was seen by Dr. 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 He did not think that she is making progress and was not able to examine her sacral decubitus ulcer as the patient would not allow it. She was switched to Bactrim DS 2 tablets twice a day since finishing her antibiotics. Her ESR was 76 and a CRP was 2.8. 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 05/23/17 on evaluation today patient's wound appears to be doing about the same in regard to the ankle as well as the gluteal area. She apparently has issues with the one back staying in place and she states the sill seems to break up the inferior aspect. She does have home help coming out to apply the wound VAC. She has been tolerating the dressing changes without any problem although she has had some increased pain in the gluteal region due to a fall she sustained  and she feels like she brews that area and she had pain actually radiating down her leg which is slowly beginning to improve. There's no evidence of infection. 05/29/17 Unfortunately on evaluation today patient's wounds both in regard to the ankle as well as in regard to her gluteal region on the right appear to be measuring the  same as last week's evaluation. She also tells me that she is having soreness today in regard to the right gluteal area because she had a visitor who came to see her a couple days ago and stayed for six hours and she had to set up for that entire length of time visiting. This has called some increased discomfort. Normally she does not set up for those long periods of time. Fortunately there's no evidence of infection and no worsening of the wounds. 06/13/2017 -- the patient has had a lot of health issues including perfuse diarrhea and generalized weakness and I have instructed her to see her PCP as soon as possible and if things get worse to go to the ER. She has not brought her wound VAC today. She continues to be on oral antibiotics Electronic Signature(s) OMAH, DEWALT ([id number]) Signed: 06/20/2017 3:49:47 PM By: Christin Fudge MD, FACS Entered By: Christin Fudge on 06/20/2017 15:49:47 Isaacson, [name] ([id number]) -------------------------------------------------------------------------------- Physical Exam Details Patient Name: DELMI, FULFER S. Date of Service: 06/20/2017 3:30 PM Medical Record Number: [id number] Patient Account Number: [id number] Date of Birth/Sex: 04-Jul-1966 (51 y.o. Female) Treating RN: Cornell Barman Primary Care Provider: Delight Stare Other Clinician: Referring Provider: Delight Stare Treating Provider/Extender: Frann Rider in Treatment: 21 Constitutional . Pulse regular. Respirations normal and unlabored. Afebrile. . Eyes Nonicteric. Reactive to light. Ears, Nose, Mouth, and Throat Lips, teeth, and gums WNL.Marland Kitchen Moist mucosa without lesions. Neck supple and nontender. No palpable supraclavicular or cervical adenopathy. Normal sized without goiter. Respiratory WNL. No retractions.. Cardiovascular Pedal Pulses WNL. No clubbing, cyanosis or edema. Lymphatic No adneopathy. No adenopathy. No adenopathy. Musculoskeletal Adexa without tenderness or  enlargement.. Digits and nails w/o clubbing, cyanosis, infection, petechiae, ischemia, or inflammatory conditions.. Integumentary (Hair, Skin) No suspicious lesions. No crepitus or fluctuance. No peri-wound warmth or erythema. No masses.Marland Kitchen Psychiatric Judgement and insight Intact.. No evidence of depression, anxiety, or agitation.. Notes right gluteal wound is looking much better today with minimal maceration and the depth is come in nicely. The right ankle wound had a film of dressing material and exudate which was easily wiped off with moist saline gauze and no sharp debridement was required today. The granulation tissue is looking healthy Electronic Signature(s) Signed: 06/20/2017 3:50:36 PM By: Christin Fudge MD, FACS Entered By: Christin Fudge on 06/20/2017 15:50:35 Siebenaler, [name] ([id number]) -------------------------------------------------------------------------------- Physician Orders Details Patient Name: [name]. Date of Service: 06/20/2017 3:30 PM Medical Record Number: [id number] Patient Account Number: [id number] Date of Birth/Sex: [birth year] (51 y.o. Female) Treating RN: Cornell Barman Primary Care Provider: Delight Stare Other Clinician: Referring Provider: Delight Stare Treating Provider/Extender: Frann Rider in Treatment: 51 Verbal / Phone Orders: No Diagnosis Coding Wound Cleansing Wound #1 Right,Lateral Malleolus o Clean wound with Normal Saline. Wound #2 Right Gluteus o Clean wound with Normal Saline. Anesthetic Wound #1 Right,Lateral Malleolus o Topical Lidocaine 4% cream applied to wound bed prior to debridement Primary Wound Dressing Wound #1 Right,Lateral Malleolus o Prisma Ag Wound #2 Right Gluteus o Prisma Ag o Prisma Ag - or equal under wound vac. Secondary Dressing Wound #1 Right,Lateral Malleolus o Boardered Foam Dressing Wound #2  Right Gluteus o Boardered Foam Dressing Dressing Change Frequency Wound #1 Right,Lateral  Malleolus o Change Dressing Monday, Wednesday, Friday Wound #2 Right Gluteus o Change Dressing Monday, Wednesday, Friday Follow-up Appointments Wound #1 Right,Lateral Malleolus o Return Appointment in 1 week. MARIADELALUZ, GUGGENHEIM ([id number]) Wound #2 Right Gluteus o Return Appointment in 1 week. Off-Loading Wound #1 Right,Lateral Malleolus o Turn and reposition every 2 hours Wound #2 Right Gluteus o Turn and reposition every 2 hours Home Health Wound #1 [place] Visits - Monday, Wednesday, Friday o Home Health Nurse may visit PRN to address patientos wound care needs. o FACE TO FACE ENCOUNTER: MEDICARE and MEDICAID PATIENTS: I certify that this patient is under my care and that I had a face-to-face encounter that meets the physician face-to-face encounter requirements with this patient on this date. The encounter with the patient was in whole or in part for the following MEDICAL CONDITION: (primary reason for [place]) MEDICAL NECESSITY: I certify, that based on my findings, NURSING services are a medically necessary home health service. HOME BOUND STATUS: I certify that my clinical findings support that this patient is homebound (i.e., Due to illness or injury, pt requires aid of supportive devices such as crutches, cane, wheelchairs, walkers, the use of special transportation or the assistance of another person to leave their place of residence. There is a normal inability to leave the home and doing so requires considerable and taxing effort. Other absences are for medical reasons / religious services and are infrequent or of short duration when for other reasons). o If current dressing causes regression in wound condition, may D/C ordered dressing product/s and apply Normal Saline Moist Dressing daily until next [place] / Other MD appointment. [place] of regression in wound condition at  [phone]. o Please direct any NON-WOUND related issues/requests for orders to patient's Primary Care Physician Wound #2 Right [place] Visits - Monday, Wednesday, Friday o Home Health Nurse may visit PRN to address patientos wound care needs. o FACE TO FACE ENCOUNTER: MEDICARE and MEDICAID PATIENTS: I certify that this patient is under my care and that I had a face-to-face encounter that meets the physician face-to-face encounter requirements with this patient on this date. The encounter with the patient was in whole or in part for the following MEDICAL CONDITION: (primary reason for [place]) MEDICAL NECESSITY: I certify, that based on my findings, NURSING services are a medically necessary home health service. HOME BOUND STATUS: I certify that my clinical findings support that this patient is homebound (i.e., Due to illness or injury, pt requires aid of supportive devices such as crutches, cane, wheelchairs, walkers, the use of special transportation or the assistance of another person to leave their place of residence. There is a normal inability to leave the home and doing so requires considerable and taxing effort. Other absences are for medical reasons / religious services and are infrequent or of short duration when for other reasons). SHYAH, CADMUS ([id number]) o If current dressing causes regression in wound condition, may D/C ordered dressing product/s and apply Normal Saline Moist Dressing daily until next [place] / Other MD appointment. [place] of regression in wound condition at [phone]. o Please direct any NON-WOUND related issues/requests for orders to patient's Primary Care Physician Negative Pressure Wound Therapy Wound #2 Right Gluteus o Wound VAC settings at 125/130 mmHg continuous pressure. Use BLACK/GREEN foam to wound cavity. Use  WHITE foam to fill any tunnel/s and/or undermining.  Change VAC dressing 2 X WEEK. Change canister as indicated when full. Nurse may titrate settings and frequency of dressing changes as clinically indicated. - Collegen dressing under wound [place] Nurse may d/c VAC for s/s of increased infection, significant wound regression, or uncontrolled drainage. [place] at [phone]. o Apply contact layer over base of wound. o Number of foam/gauze pieces used in the dressing = o Place NPWT on HOLD. Electronic Signature(s) Signed: 06/20/2017 4:02:51 PM By: Christin Fudge MD, FACS Signed: 06/20/2017 4:26:40 PM By: Gretta Cool, [name], RN, CWS, [name], [name] Entered By: Gretta Cool, [name], RN, CWS, Kim on 06/20/2017 15:56:19 Califano, [name] ([id number]) -------------------------------------------------------------------------------- Problem List Details Patient Name: GALIT, URICH. Date of Service: 06/20/2017 3:30 PM Medical Record Number: [id number] Patient Account Number: [id number] Date of Birth/Sex: [birth year] (51 y.o. Female) Treating RN: Cornell Barman Primary Care Provider: Delight Stare Other Clinician: Referring Provider: Delight Stare Treating Provider/Extender: Frann Rider in Treatment: 21 Active Problems ICD-10 Encounter Code Description Active Date Diagnosis E11.621 Type 2 diabetes mellitus with foot ulcer 01/23/2017 Yes L89.314 Pressure ulcer of right buttock, stage 4 01/23/2017 Yes L97.312 Non-pressure chronic ulcer of right ankle with fat layer 01/23/2017 Yes exposed F17.218 Nicotine dependence, cigarettes, with other nicotine- 01/23/2017 Yes induced disorders I70.233 Atherosclerosis of native arteries of right leg with 01/23/2017 Yes ulceration of ankle M86.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  ([id number]) -------------------------------------------------------------------------------- Progress Note Details Patient Name: [name]. Date of Service: 06/20/2017 3:30 PM Medical Record Number: [id number] Patient Account Number: [id number] Date of Birth/Sex: [birth year] (51 y.o. Female) Treating RN: Cornell Barman Primary Care Provider: Delight Stare Other Clinician: Referring Provider: Delight Stare Treating Provider/Extender: Frann Rider in Treatment: 21 Subjective Chief Complaint Information obtained from Patient Patients presents for treatment of an open diabetic ulcer with an arterial etiology on her right lateral ankle and right gluteal area History of Present Illness (HPI) The following HPI elements were documented for the patient's wound: Location: right lateral ankle and right gluteal area Quality: Patient reports experiencing a sharp pain to affected area(s). Severity: Patient states wound are getting better slowly Duration: Patient has had the wound for > 4 months prior to seeking treatment at the wound center Timing: Pain in wound is constant (hurts all the time) Context: The wound occurred when the patient was in hospital with a necrotizing fasciitis of the right gluteal area and a ulceration on the right ankle Modifying Factors: Other treatment(s) tried include:admitted to the hospital for IV antibiotics and a full workup and has also had a recent angiogram Associated Signs and Symptoms: Patient reports having increase discharge. 51 year old patient was sent to [place] from [facility] where she was seen by Dr. Sherril Cong for a left ankle ulceration and was recently hospitalized with hypotension and sepsis. She was treated with IV antibiotics and has been scheduled to see Dr. Sharol Given. She was seen by vascular surgery who recommended a femoral bypass but surgery has been delayed until her sacral wound from last year's necrotizing fasciitis has healed. Was  seeing the wound care team at [facility] but wanted to change over. She is a smoker and smokes a pack of cigarettes a day The patient was recently admitted in [place] between February 2 and February 14.  She had a follow- up to see vascular surgery, orthopedic surgery and infectious disease. 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 Review of the electronic medical records reveals that Dr. 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 An MRI showed edema in the bone which was consistent with osteomyelitis and the chronic ulcer and may need surgical intervention. She also had a sacral decubitus ulcer which was treated with the wound VAC. On 01/07/2017 she was taken up by the vascular surgeon Dr. Trula Slade for an abdominal aortogram and bilateral lower extremity runoff, for a history of having bilateral femoropopliteal bypass graft as well as external iliac stenting on the right and stenting of her bypass graft. She had developed a nonhealing wound on her right ankle and there was a possibility of a femoral occlusion. the findings were noted and the SHAILEY, BUTTERBAUGH. ([id number]) impression was a surgical revascularization with a aorto bifemoral bypass graft. Both the femoropopliteal bypass grafts were patent. 01/31/2017 -- x-ray of the right hip and pelvis -- IMPRESSION:No radiographic evidence of acute osteomyelitis. Normal-appearing right hip joint space for age. No acute bony abnormality of the hip. Incidental note is made of some scleroses of the lower third of the right SI  joint which is chronic. Since seeing her last week she has not had an appointment with infectious disease or the orthopedic specialist yet. 02/06/2017 -- the patient missed a couple of appointments yesterday due to the weather but other than that has apparently given up smoking for the last 5 days. 02/13/2017 -- she has rescheduled her infectious disease appointment and also the appointment with the orthopedic surgeon Dr. Sharol Given 03/06/2017 -- she was seen by Dr. Lucianne Lei dam of infectious disease on 03/05/2017 and he recommendedIV antibiotics but the patient did not want to have that and he has given her Augmentin and doxycycline and will reevaluate her in 2 months time. 03/13/2017 -- he saw Dr. Trula Slade regarding her vascular issues and he would like to wait to the sacral wound is completely closed before considering aortobifemoral bypass graft. He will see her back in 2 months time. She was also seen by Dr. Sharol Given of orthopedics who recommended continue wound care dressings and follow in the office in about 2 months time. he also recommended 3 view radiographs of the right ankle at follow-up. 03/28/2017 -- she did have a PICC line placed and Dr. Lucianne Lei dam has begun IV vancomycin and ceftriaxone. she is going to continue this until she sees him in approximately a month's time 04/18/2017 -- we had applied for a skin substitute for the patient's care but her copayment is going to be about $300 a piece and the patient will not be able to afford this. 05/08/17 on evaluation today patient appears to potentially have a fungal rash in the periwound region of the sacral area. This also seems to extend into the inguinal creases and factional region as well. She is tender to palpation with light rubbing over the region of the periwound and the skin in this region does have a beefy red appearance. Everything seems to point to a fungal infection at this  time. She has been tolerating the dressing changes up to this  point. There is no evidence of infection otherwise. 05/16/2017 -- was seen by Dr. 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 He did not think that she is making progress and was not able to examine her sacral decubitus ulcer as the patient would not allow it. She was switched to Bactrim DS 2 tablets twice a day since finishing her antibiotics. Her ESR was 76 and a CRP was 2.8. 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 05/23/17 on evaluation today patient's wound appears to be doing about the same in regard to the ankle as well as the gluteal area. She apparently has issues with the one back staying in place and she states the sill seems to break up the inferior aspect. She does have home help coming out to apply the wound VAC. She has been tolerating the dressing changes without any problem although she has had some increased pain in the gluteal region due to a fall she sustained and she feels like she brews that area and she had pain actually radiating down her leg which is slowly beginning to improve. There's no evidence of infection. 05/29/17 Unfortunately on evaluation today patient's wounds both in regard to the ankle as well as in regard to her gluteal region on the right appear to be measuring the same as last week's evaluation. She also tells me that she is having soreness today in regard to the right gluteal area because she had a visitor who came to see her a couple days ago and stayed for six hours and she had to set up for that entire length of time NYAJA, DUBUQUE. ([id number]) visiting. This has called some increased discomfort. Normally she does not set up for those long periods of time. Fortunately there's no evidence of infection and no worsening of the wounds. 06/13/2017 -- the patient has had a  lot of health issues including perfuse diarrhea and generalized weakness and I have instructed her to see her PCP as soon as possible and if things get worse to go to the ER. She has not brought her wound VAC today. She continues to be on oral antibiotics Objective Constitutional Pulse regular. Respirations normal and unlabored. Afebrile. Vitals Time Taken: 3:30 PM, Height: 67 in, Weight: 137 lbs, BMI: 21.5, Temperature: 98.1 F, Pulse: 102 bpm, Respiratory Rate: 16 breaths/min. Eyes Nonicteric. Reactive to light. Ears, Nose, Mouth, and Throat Lips, teeth, and gums WNL.Marland Kitchen Moist mucosa without lesions. Neck supple and nontender. No palpable supraclavicular or cervical adenopathy. Normal sized without goiter. Respiratory WNL. No retractions.. Cardiovascular Pedal Pulses WNL. No clubbing, cyanosis or edema. Lymphatic No adneopathy. No adenopathy. No adenopathy. Musculoskeletal Adexa without tenderness or enlargement.. Digits and nails w/o clubbing, cyanosis, infection, petechiae, ischemia, or inflammatory conditions.Marland Kitchen Psychiatric Judgement and insight Intact.. No evidence of depression, anxiety, or agitation.. General Notes: right gluteal wound is looking much better today with minimal maceration and the depth is Ade, Nayeli S. ([id number]) come in nicely. The right ankle wound had a film of dressing material and exudate which was easily wiped off with moist saline gauze and no sharp debridement was required today. The granulation tissue is looking healthy Integumentary (Hair, Skin) No suspicious lesions. No crepitus or fluctuance. No peri-wound warmth or erythema. No masses.Marland Kitchen  Wound #1 status is Open. Original cause of wound was Gradually Appeared. The wound is located on the Right,Lateral Malleolus. The wound measures 1.2cm length x 1.5cm width x 0.2cm depth; 1.414cm^2 area and 0.283cm^3 volume. There is Fat Layer (Subcutaneous Tissue) Exposed exposed. There is no tunneling noted,  however, there is undermining starting at 9:00 and ending at 3:00 with a maximum distance of 0.4cm. There is a large amount of serosanguineous drainage noted. The wound margin is distinct with the outline attached to the wound base. There is large (67-100%) pink granulation within the wound bed. There is a small (1-33%) amount of necrotic tissue within the wound bed including Adherent Slough. The periwound skin appearance exhibited: Induration, Maceration, Erythema. The periwound skin appearance did not exhibit: Callus, Crepitus, Excoriation, Rash, Scarring, Dry/Scaly, Atrophie Blanche, Cyanosis, Ecchymosis, Hemosiderin Staining, Mottled, Pallor, Rubor. The surrounding wound skin color is noted with erythema which is circumferential. Periwound temperature was noted as No Abnormality. The periwound has tenderness on palpation. Wound #2 status is Open. Original cause of wound was Gradually Appeared. The wound is located on the Right Gluteus. The wound measures 1.5cm length x 1cm width x 1.2cm depth; 1.178cm^2 area and 1.414cm^3 volume. There is Fat Layer (Subcutaneous Tissue) Exposed exposed. There is a large amount of serous drainage noted. The wound margin is distinct with the outline attached to the wound base. There is medium (34-66%) red, pink granulation within the wound bed. There is a medium (34-66%) amount of necrotic tissue within the wound bed including Adherent Slough. The periwound skin appearance exhibited: Maceration, Ecchymosis. Periwound temperature was noted as No Abnormality. The periwound has tenderness on palpation. 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 ([id number]) Wound Cleansing: Wound #1 Right,Lateral Malleolus: Clean wound with Normal Saline. Wound #2 Right Gluteus: Clean wound with Normal Saline. Anesthetic: Wound #1 Right,Lateral Malleolus: Topical Lidocaine 4% cream applied to wound bed prior to debridement Primary Wound Dressing: Wound #1 Right,Lateral Malleolus: Prisma Ag Wound #2 Right Gluteus: Prisma Ag Prisma Ag - or equal under wound vac. Secondary Dressing: Wound #1 Right,Lateral Malleolus: Boardered Foam Dressing Wound #2 Right Gluteus: Boardered Foam Dressing Dressing Change Frequency: Wound #1 Right,Lateral Malleolus: Change Dressing Monday, Wednesday, Friday Wound #2 Right Gluteus: Change Dressing Monday, Wednesday, Friday Follow-up Appointments: Wound #1 Right,Lateral Malleolus: Return Appointment in 1 week. Wound #2 Right Gluteus: Return Appointment in 1 week. Off-Loading: Wound #1 Right,Lateral Malleolus: Turn and reposition every 2 hours Wound #2 Right Gluteus: Turn and reposition every 2 hours Home Health: Wound #1 Right,Lateral Malleolus: [place] Visits - Monday, Wednesday, Friday [place] Nurse may visit PRN to address patient s wound care needs. FACE TO FACE ENCOUNTER: MEDICARE and MEDICAID PATIENTS: I certify that this patient is under my care and that I had a face-to-face encounter that meets the physician face-to-face encounter requirements with this patient on this date. The encounter with the patient was in whole or in part for the following MEDICAL CONDITION: (primary reason for [place]) MEDICAL NECESSITY: I certify, that based on my findings, NURSING services are a medically necessary home health service. HOME BOUND STATUS: I certify that  my clinical findings support that this patient is homebound (i.e., Due to illness or injury, pt requires aid of  supportive devices such as crutches, cane, wheelchairs, walkers, the use of special transportation or the assistance of another person to leave their place of residence. There is a normal inability to leave the home and doing so requires considerable and taxing effort. Other absences are for medical reasons / religious services and are infrequent or of short duration when for other reasons). If current dressing causes regression in wound condition, may D/C ordered dressing product/s and apply Normal Saline Moist Dressing daily until next [place] / Other MD appointment. Notify Wound Vida, [place] ([id number]) [place] of regression in wound condition at [phone]. Please direct any NON-WOUND related issues/requests for orders to patient's Primary Care Physician Wound #2 Right Gluteus: [place] Visits - Monday, Wednesday, Friday Home Health Nurse may visit PRN to address patient s wound care needs. FACE TO FACE ENCOUNTER: MEDICARE and MEDICAID PATIENTS: I certify that this patient is under my care and that I had a face-to-face encounter that meets the physician face-to-face encounter requirements with this patient on this date. The encounter with the patient was in whole or in part for the following MEDICAL CONDITION: (primary reason for [place]) MEDICAL NECESSITY: I certify, that based on my findings, NURSING services are a medically necessary home health service. HOME BOUND STATUS: I certify that my clinical findings support that this patient is homebound (i.e., Due to illness or injury, pt requires aid of supportive devices such as crutches, cane, wheelchairs, walkers, the use of special transportation or the assistance of another person to leave their place of residence. There is a normal inability to leave the home and doing so requires considerable and taxing effort. Other absences are for medical reasons / religious services and are infrequent or  of short duration when for other reasons). If current dressing causes regression in wound condition, may D/C ordered dressing product/s and apply Normal Saline Moist Dressing daily until next [place] / Other MD appointment. [place] of regression in wound condition at [phone]. Please direct any NON-WOUND related issues/requests for orders to patient's Primary Care Physician Negative Pressure Wound Therapy: Wound #2 Right Gluteus: Wound VAC settings at 125/130 mmHg continuous pressure. Use BLACK/GREEN foam to wound cavity. Use WHITE foam to fill any tunnel/s and/or undermining. Change VAC dressing 2 X WEEK. Change canister as indicated when full. Nurse may titrate settings and frequency of dressing changes as clinically indicated. - Collegen dressing under wound [place] Nurse may d/c VAC for s/s of increased infection, significant wound regression, or uncontrolled drainage. [place] at [phone]. Apply contact layer over base of wound. Number of foam/gauze pieces used in the dressing = Place NPWT on HOLD. after review today I have recommended: 1. Silver collagen to be applied to the right ankle, change daily 2. Silver collagen under the black foam for the wound VAC, to the right gluteal region to be changed 3 times a week. 3. adequate protein, vitamin A, vitamin C and zinc Electronic Signature(s) Signed: 06/20/2017 4:04:41 PM By: Christin Fudge MD, FACS Previous Signature: 06/20/2017 3:51:04 PM Version By: Christin Fudge MD, FACS Entered By: Christin Fudge on 06/20/2017 16:04:41 Forester, Matty Chauncey Cruel ([id number]) -------------------------------------------------------------------------------- SuperBill Details Patient Name: [name]. Date of Service: 06/20/2017 Medical Record Number: [id number] Patient Account Number: [id number] Date of Birth/Sex: 1966-02-26 (51 y.o. 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 F17.218 Nicotine dependence, cigarettes, with other nicotine-induced disorders I70.233 Atherosclerosis of native arteries of right leg with ulceration of ankle M86.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, right ankle and foot Facility Procedures CPT4 Code: 88416606 Description: 99213 - WOUND CARE VISIT-LEV 3 EST PT Modifier: Quantity: 1 Physician Procedures CPT4 Code Description: [id number] 93235 - WC PHYS LEVEL 3 - EST PT ICD-10 Description Diagnosis E11.621 Type 2 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

## 2017-06-25 ENCOUNTER — Ambulatory Visit (INDEPENDENT_AMBULATORY_CARE_PROVIDER_SITE_OTHER): Payer: Medicare Other | Admitting: Internal Medicine

## 2017-06-25 VITALS — BP 98/62 | HR 82 | Ht 67.0 in | Wt 150.2 lb

## 2017-06-25 DIAGNOSIS — I959 Hypotension, unspecified: Secondary | ICD-10-CM | POA: Diagnosis not present

## 2017-06-25 DIAGNOSIS — I739 Peripheral vascular disease, unspecified: Secondary | ICD-10-CM | POA: Diagnosis not present

## 2017-06-25 DIAGNOSIS — E875 Hyperkalemia: Secondary | ICD-10-CM

## 2017-06-26 ENCOUNTER — Encounter: Payer: Self-pay | Admitting: Internal Medicine

## 2017-06-26 DIAGNOSIS — E875 Hyperkalemia: Secondary | ICD-10-CM | POA: Insufficient documentation

## 2017-06-26 MED ORDER — MIDODRINE HCL 10 MG PO TABS: 5.0000 mg | ORAL_TABLET | Freq: Three times a day (TID) | ORAL | 0 refills | Status: DC | PRN

## 2017-06-26 NOTE — Progress Notes (Signed)
Follow-up Outpatient Visit Date: 06/25/2017  Primary Care Provider: [name], [place] [address]  Chief Complaint: Regular follow-up  HPI:  [name] is a 51 y.o. year-old female with history of extensive peripheral vascular disease followed by Dr. Trula Slade, hypertension and diabetes mellitus, hyperlipidemia, asthma, and recurrent wounds complicated by osteomyelitis, who presents for cardiology follow-up. She was evaluated in March by Dr. Yvone Neu in anticipation of aortobifemoral bypass. Her main complaint today is of continued pain in the right buttock at the site of a slowly healing wound. Wound VAC had been in place, though this was discontinued yesterday due to poor seal. She remains under close follow-up with infectious disease, vascular surgery, and orthopedics. Previously discussed aortobifemoral bypass has been put on hold due to her healing wound.  [name] feels well from a heart standpoint, though she is very limited in her mobility due to her wounds, chronic pain, and vascular disease. She denies chest pain, shortness of breath, palpitations, orthopnea, and PND. She has had edema in the past but notes that this has been minimal for the last few months. She has not needed to take any furosemide for at least 3-4 months. She continues to have intermittent lightheadedness and is concerned that near syncopal episodes may be a manifestation of seizures, given a family member who had similar episodes and was diagnosed with a seizure disorder. She has not fallen or passed out. She has not been using midodrine for several minutes.  --------------------------------------------------------------------------------------------------  Past Medical History:  Diagnosis Date  . Arthritis   . Asthma   . Chronic back pain   . Depression   . Diabetes mellitus   . Diabetic neuropathy ([place])   . Family history of adverse reaction to anesthesia    mother had difficlty  waking   . GERD (gastroesophageal reflux disease)   . Hyperlipidemia   . Joint pain   . Leg pain    With Walking  . Osteomyelitis of right fibula ([place]) 03/05/2017  . PAD (peripheral artery disease) ([place])   . Reflux   . Ulcer    Foot   Past Surgical History:  Procedure Laterality Date  . ABDOMINAL AORTAGRAM  June 15, 2014  . ABDOMINAL AORTAGRAM N/A 06/15/2014   Procedure: ABDOMINAL Maxcine Ham;  Surgeon: Serafina Mitchell, MD;  [facility] C[facility] LAB;  Service: Cardiovascular;  Laterality: N/A;  . ABDOMINAL AORTAGRAM N/A 11/22/2014   Procedure: ABDOMINAL AORTAGRAM;  Surgeon: Serafina Mitchell, MD;  Location: [facility] CATH LAB;  Service: Cardiovascular;  Laterality: N/A;  . ABDOMINAL AORTOGRAM W/LOWER EXTREMITY N/A 01/07/2017   Procedure: Abdominal Aortogram w/Lower Extremity;  Surgeon: Serafina Mitchell, MD;  Location: [place] CV LAB;  Service: Cardiovascular;  Laterality: N/A;  . ARTERIAL BYPASS SURGRY  07/05/2010   Right Common Femoral to below knee popliteal BPG  . BACK SURGERY     X's  2  . CARDIAC CATHETERIZATION    . CHOLECYSTECTOMY     Gall Bladder  . CYSTECTOMY Right    foot  . CYSTECTOMY Left    wrist  . INTERCOSTAL NERVE BLOCK  November 2015  . IR FLUORO GUIDE CV LINE RIGHT  03/20/2017  . IR US GUIDE VASC ACCESS RIGHT  03/20/2017  . IRRIGATION AND DEBRIDEMENT BUTTOCKS Right 09/30/2016   Procedure: DEBRIDEMENT RIGHT  BUTTOCK WOUND;  Surgeon: Georganna Skeans, MD;  Location: [place];  Service: General;  Laterality: Right;  . left foot surgery    . left  wrist cyst removal Left   . PERIPHERAL VASCULAR CATHETERIZATION N/A 05/07/2016   Procedure: Abdominal Aortogram;  Surgeon: Serafina Mitchell, MD;  Location: [place] CV LAB;  Service: Cardiovascular;  Laterality: N/A;  . PERIPHERAL VASCULAR CATHETERIZATION N/A 05/07/2016   Procedure: Lower Extremity Angiography;  Surgeon: Serafina Mitchell, MD;  Location: [place] CV LAB;  Service: Cardiovascular;  Laterality: N/A;  . PERIPHERAL VASCULAR  CATHETERIZATION N/A 05/07/2016   Procedure: Aortic Arch Angiography;  Surgeon: Serafina Mitchell, MD;  Location: [place] CV LAB;  Service: Cardiovascular;  Laterality: N/A;  . PERIPHERAL VASCULAR CATHETERIZATION N/A 05/07/2016   Procedure: Upper Extremity Angiography;  Surgeon: Serafina Mitchell, MD;  Location: [facility] CV LAB;  Service: Cardiovascular;  Laterality: N/A;  . PERIPHERAL VASCULAR CATHETERIZATION Right 05/07/2016   Procedure: Peripheral Vascular Balloon Angioplasty;  Surgeon: Serafina Mitchell, MD;  Location: [place] CV LAB;  Service: Cardiovascular;  Laterality: Right;  subclavian  . PERIPHERAL VASCULAR CATHETERIZATION Right 05/07/2016   Procedure: Peripheral Vascular Intervention;  Surgeon: Serafina Mitchell, MD;  Location: [place] CV LAB;  Service: Cardiovascular;  Laterality: Right;  External  Iliac  . SKIN GRAFT Right 2012   [name] by Dr. Nils Pyle- Right and Left Ankle  . SPINE SURGERY    . TONSILLECTOMY     Current Meds  Medication Sig  . albuterol (PROVENTIL) 2 MG tablet Take 2 mg by mouth 3 (three) times daily as needed for shortness of breath.   Marland Kitchen aspirin 81 MG tablet Take 81 mg by mouth daily.   Marland Kitchen atorvastatin (LIPITOR) 40 MG tablet Take 40 mg by mouth daily.  . cetirizine (ZYRTEC) 10 MG tablet Take 10 mg by mouth daily as needed for allergies. Reported on 03/27/2016  . cyclobenzaprine (FLEXERIL) 10 MG tablet Limit 1 tablet by mouth 1  to  3 times per day if tolerated (Patient taking differently: Take 10 mg by mouth 3 (three) times daily as needed for muscle spasms. )  . dicyclomine (BENTYL) 10 MG capsule Take 10 mg by mouth 4 (four) times daily as needed for spasms.  Marland Kitchen HUMALOG KWIKPEN 100 UNIT/ML KiwkPen Inject 10 Units into the skin 3 (three) times daily. Per sliding scale  . HYDROcodone-acetaminophen (NORCO) 10-325 MG tablet Limit 1 tablet by mouth per day or twice per day if tolerated (Patient taking differently: Take 1 tablet by mouth See admin instructions. Every 8-12  hours)  . LANTUS SOLOSTAR 100 UNIT/ML Solostar Pen Inject 36 Units into the skin 2 (two) times daily.   Marland Kitchen LORazepam (ATIVAN) 0.5 MG tablet Take 1 tablet 1 hour prior to PICC insertion.  May take 2nd pill at hospital if needed.  Marland Kitchen omeprazole (PRILOSEC) 40 MG capsule Take 40 mg by mouth daily.  . ondansetron (ZOFRAN ODT) 4 MG disintegrating tablet Take 1 tablet (4 mg total) by mouth every 8 (eight) hours as needed for nausea or vomiting.  . pregabalin (LYRICA) 100 MG capsule Limit 1 tab by mouth twice a day to 3 times a day if tolerated (Patient taking differently: Take 100 mg by mouth 3 (three) times daily. )  . pseudoephedrine-guaifenesin (MUCINEX D) 60-600 MG per tablet Take 1 tablet by mouth every 12 (twelve) hours as needed for congestion. Reported on 04/08/2016  . sulfamethoxazole-trimethoprim (BACTRIM DS,SEPTRA DS) 800-160 MG tablet Take 2 tablets by mouth 2 (two) times daily.    Allergies: Penicillins  Social History   Social History  . Marital status: Married    Spouse name:  N/A  . Number of children: N/A  . Years of education: N/A   Occupational History  . disabled    Social History Main Topics  . Smoking status: Former Smoker    Years: 30.00    Types: Cigarettes    Quit date: 02/07/2017  . Smokeless tobacco: Never Used  . Alcohol use No  . Drug use: No  . Sexual activity: Yes    Birth control/ protection: Post-menopausal   Other Topics Concern  . Not on file   Social History Narrative  . No narrative on file    Family History  Problem Relation Age of Onset  . Coronary artery disease Mother   . Peripheral vascular disease Mother   . Heart disease Mother        Before age 73  . Other Mother        Venous insuffiency  . Diabetes Mother   . Hyperlipidemia Mother   . Hypertension Mother   . Varicose Veins Mother   . Heart attack Mother        before age 73  . Heart disease Father   . Diabetes Father   . Diabetes Maternal Grandmother   . Diabetes Paternal  Grandmother   . Diabetes Paternal Grandfather   . Diabetes Sister   . Hypertension Sister   . Diabetes Brother   . Hypertension Brother     Review of Systems: A 12-system review of systems was performed and was negative except as noted in the HPI.  --------------------------------------------------------------------------------------------------  Physical Exam: BP 98/62 (BP Location: Right Arm)   Pulse 82   General:  Chronically ill-appearing woman lying on exam table. She does not place weight on her right buttock. HEENT: No conjunctival pallor or scleral icterus. Moist mucous membranes.  OP clear. Neck: Supple without lymphadenopathy, thyromegaly, JVD, or HJR. Lungs: Normal work of breathing. Clear to auscultation bilaterally without wheezes or crackles. Heart: Regular rate and rhythm without murmurs, rubs, or gallops. Non-displaced PMI. Abd: Bowel sounds present. Soft, NT/ND without hepatosplenomegaly Ext: Trace ankle edema. Right radial pulses 1+. Left radial pulse is absent. Pedal pulses are trace bilaterally. Skin: Bandage overlies right lateral malleolus and hindfoot.  EKG:  Normal sinus rhythm without significant abnormalities.  Lab Results  Component Value Date   WBC 9.3 05/12/2017   HGB 12.5 05/12/2017   HCT 38.7 05/12/2017   MCV 83.8 05/12/2017   PLT 293 05/12/2017    Lab Results  Component Value Date   NA 137 05/15/2017   K 5.7 (H) 05/15/2017   CL 100 05/15/2017   CO2 20 05/15/2017   BUN 32 (H) 05/15/2017   CREATININE 1.29 (H) 05/15/2017   GLUCOSE 289 (H) 05/15/2017   ALT 8 (L) 12/23/2016    Lab Results  Component Value Date   CHOL 213 (H) 01/16/2017   HDL 32 (L) 01/16/2017   LDLCALC 119 (H) 01/16/2017   TRIG 310 (H) 01/16/2017   CHOLHDL 6.7 (H) 01/16/2017   --------------------------------------------------------------------------------------------------  ASSESSMENT AND PLAN: Peripheral vascular disease Ms. Kettlewell continues to have poorly  healing wounds involving the right but AND right foot/ankle. She should continue to follow with Dr. Trula Slade as well as the wound care clinic and orthopedics. From a cardiac standpoint, Ms. Picado has not had any unstable symptoms with the exception of longstanding intermittent lightheadedness. It is difficult to accurately assess her blood pressure due to her bilateral subclavian artery disease. Right arm cuff pressure is low normal today. Cardiac workup this spring was notable  for a stress test demonstrating inferolateral fixed defect. Subsequent echo showed preserved LVEF. EKG today does not show any inferior Q waves. I suspect that the fixed defect was most likely due to attenuation artifact. Additional cardiac testing or intervention at this time would not further mitigate her perioperative cardiovascular risk for any future vascular surgical procedures. Continued secondary prevention in the setting of advanced PVD is recommended, including aspirin and high intensity statin therapy.  Hypotension Blood pressure is borderline low today, though the accuracy of this is questionable given bilateral subclavian artery disease. In the past, she was prescribed midodrine but has not needed to use this in the last several months. Given that she only has rare episodes of lightheadedness, I think it is reasonable to use midodrine as needed if lightheadedness occurs. I encouraged Ms. Latella to stay well-hydrated. She should continue to use furosemide only for significant leg swelling or weight gain.  Hyperkalemia Recent labs notable for potassium of 5.7. This is followed Dr. [name].  Follow-up: Return to clinic in 6 months.  Nelva Bush, MD 06/26/2017 7:46 AM

## 2017-06-26 NOTE — Patient Instructions (Addendum)
LATE ENTRY due to EPIC system not available at that time. Patient was given the following information prior to leaving the office and verbalized understanding.   Medication Instructions:  Your physician has recommended you make the following change in your medication:  1- CHANGE Midodrine to 5 mg by mouth three times a day as needed.   Follow-Up: Your physician wants you to follow-up in: 6 MONTHS WITH DR END. You will receive a reminder letter in the mail two months in advance. If you don't receive a letter, please call our office to schedule the follow-up appointment.

## 2017-06-26 NOTE — Addendum Note (Signed)
Addended by: Vanessa Ralphs on: 06/26/2017 09:16 AM   Modules accepted: Orders

## 2017-06-30 ENCOUNTER — Encounter: Payer: Medicare Other | Admitting: Nurse Practitioner

## 2017-06-30 ENCOUNTER — Encounter (INDEPENDENT_AMBULATORY_CARE_PROVIDER_SITE_OTHER): Payer: Self-pay | Admitting: Orthopedic Surgery

## 2017-06-30 ENCOUNTER — Ambulatory Visit (INDEPENDENT_AMBULATORY_CARE_PROVIDER_SITE_OTHER): Payer: Medicare Other | Admitting: Orthopedic Surgery

## 2017-06-30 ENCOUNTER — Encounter: Payer: Self-pay | Admitting: Internal Medicine

## 2017-06-30 VITALS — Wt 149.0 lb

## 2017-06-30 DIAGNOSIS — E11622 Type 2 diabetes mellitus with other skin ulcer: Secondary | ICD-10-CM | POA: Diagnosis not present

## 2017-06-30 DIAGNOSIS — I70233 Atherosclerosis of native arteries of right leg with ulceration of ankle: Secondary | ICD-10-CM | POA: Diagnosis not present

## 2017-06-30 NOTE — Progress Notes (Signed)
Information that was documented today under the tabs Allergies, Visit info, Vital signs, History, and Medications was a last entry from 06/25/2017 at 3:30PM due to Epic system being down.

## 2017-07-01 NOTE — Progress Notes (Signed)
[name] ([id number]) Visit Report for 06/30/2017 Arrival Information Details Patient Name: [name]. Date of Service: 06/30/2017 3:00 PM Medical Record Number: [id number] Patient Account Number: [id number] Date of Birth/Sex: 1966/04/10 (51 y.o. Female) Treating RN: Montey Hora Primary Care Jazlynne Milliner: Delight Stare Other Clinician: Referring Dalinda Heidt: Delight Stare Treating Areyana Leoni/Extender: Cathie Olden in Treatment: 9 Visit Information History Since Last Visit Added or deleted any medications: No Patient Arrived: Cane Any new allergies or adverse reactions: No Arrival Time: 15:04 Had a fall or experienced change in No Accompanied By: self activities of daily living that may affect Transfer Assistance: None risk of falls: Patient Identification Verified: Yes Signs or symptoms of abuse/neglect since last No Secondary Verification Process Completed: Yes visito Patient Requires Transmission-Based No Hospitalized since last visit: No Precautions: Has Dressing in Place as Prescribed: Yes Patient Has Alerts: No Pain Present Now: No Electronic Signature(s) Signed: 06/30/2017 5:39:18 PM By: Montey Hora Entered By: Montey Hora on 06/30/2017 15:04:58 [name], [name] ([id number]) -------------------------------------------------------------------------------- Encounter Discharge Information Details Patient Name: Jamie Martyr. Date of Service: 06/30/2017 3:00 PM Medical Record Number: [id number] Patient Account Number: [id number] Date of Birth/Sex: September 28, 1966 (51 y.o. Female) Treating RN: Montey Hora Primary Care Lechelle Wrigley: Delight Stare Other Clinician: Referring Darshay Deupree: Delight Stare Treating Kaylin Marcon/Extender: Cathie Olden in Treatment: 40 Encounter Discharge Information Items Discharge Pain Level: 0 Discharge Condition: Stable Ambulatory Status: Cane Discharge Destination: Home Private Transportation: Auto Accompanied By:  self Schedule Follow-up Appointment: Yes Medication Reconciliation completed and No provided to Patient/Care Krystine Pabst: Clinical Summary of Care: Electronic Signature(s) Signed: 06/30/2017 5:39:18 PM By: Montey Hora Entered By: Montey Hora on 06/30/2017 15:22:52 [name] ([id number]) -------------------------------------------------------------------------------- Lower Extremity Assessment Details Patient Name: Jamie Martyr. Date of Service: 06/30/2017 3:00 PM Medical Record Number: [id number] Patient Account Number: [id number] Date of Birth/Sex: May 03, 1966 (51 y.o. Female) Treating RN: Montey Hora Primary Care Baley Lorimer: Delight Stare Other Clinician: Referring Dannya Pitkin: Delight Stare Treating Chase Knebel/Extender: Cathie Olden in Treatment: 22 Vascular Assessment Pulses: Dorsalis Pedis Palpable: [Right:Yes] Posterior Tibial Extremity colors, hair growth, and conditions: Extremity Color: [Right:Mottled] Hair Growth on Extremity: [Right:No] Temperature of Extremity: [Right:Cool] Capillary Refill: [Right:< 3 seconds] Electronic Signature(s) Signed: 06/30/2017 5:39:18 PM By: Montey Hora Entered By: Montey Hora on 06/30/2017 15:17:27 [name], [name] ([id number]) -------------------------------------------------------------------------------- Multi Wound Chart Details Patient Name: Jamie Martyr. Date of Service: 06/30/2017 3:00 PM Medical Record Number: [id number] Patient Account Number: [id number] Date of Birth/Sex: 10/17/1966 (51 y.o. Female) Treating RN: Montey Hora Primary Care Aarron Wierzbicki: Delight Stare Other Clinician: Referring Jaleea Alesi: Delight Stare Treating Markees Carns/Extender: Cathie Olden in Treatment: 22 Vital Signs Height(in): 67 Pulse(bpm): 103 Weight(lbs): 137 Blood Pressure (mmHg): Body Mass Index(BMI): 21 Temperature(F): 98.5 Respiratory Rate 16 (breaths/min): Photos: [1:No Photos] [2:No Photos] [N/A:N/A] Wound  Location: [1:Right Malleolus - Lateral] [2:Right Gluteus] [N/A:N/A] Wounding Event: [1:Gradually Appeared] [2:Gradually Appeared] [N/A:N/A] Primary Etiology: [1:Diabetic Wound/Ulcer of the Lower Extremity] [2:Open Surgical Wound] [N/A:N/A] Comorbid History: [1:Anemia, Arrhythmia, Coronary Artery Disease, Hypotension, Peripheral Arterial Disease, Type II Diabetes, Osteoarthritis] [2:Anemia, Arrhythmia, Coronary Artery Disease, Hypotension, Peripheral Arterial Disease, Type II Diabetes,  Osteoarthritis] [N/A:N/A] Date Acquired: [1:09/23/2016] [2:09/18/2016] [N/A:N/A] Weeks of Treatment: [1:22] [2:22] [N/A:N/A] Wound Status: [1:Open] [2:Open] [N/A:N/A] Measurements L x W x D 1.2x1.2x0.2 [2:1.4x1x1] [N/A:N/A] (cm) Area (cm) : [1:1.131] [2:1.1] [N/A:N/A] Volume (cm) : [1:0.226] [2:1.1] [N/A:N/A] % Reduction in Area: [1:71.20%] [2:94.30%] [N/A:N/A] % Reduction in Volume: 88.50% [2:98.90%] [N/A:N/A] Classification: [1:Grade 2] [2:Full Thickness Without Exposed Support Structures] [N/A:N/A] Exudate Amount: [1:Large] [2:Large] [N/A:N/A]  Exudate Type: [1:Serosanguineous] [2:Serous] [N/A:N/A] Exudate Color: [1:red, brown] [2:amber] [N/A:N/A] Wound Margin: [1:Distinct, outline attached Distinct, outline attached] [N/A:N/A] Granulation Amount: [1:Large (67-100%)] [2:Large (67-100%)] [N/A:N/A] Granulation Quality: [1:Pink] [2:Red, Pink] [N/A:N/A] Necrotic Amount: [1:Small (1-33%)] [2:Small (1-33%)] [N/A:N/A] Exposed Structures: [N/A:N/A] Fat Layer (Subcutaneous Fat Layer (Subcutaneous Tissue) Exposed: Yes Tissue) Exposed: Yes Fascia: No Fascia: No Tendon: No Tendon: No Muscle: No Muscle: No Joint: No Joint: No Bone: No Bone: No Epithelialization: None None N/A Periwound Skin Texture: Induration: Yes No Abnormalities Noted N/A Excoriation: No Callus: No Crepitus: No Rash: No Scarring: No Periwound Skin Maceration: Yes Maceration: Yes N/A Moisture: Dry/Scaly: No Periwound Skin Color:  Erythema: Yes Ecchymosis: Yes N/A Atrophie Blanche: No Cyanosis: No Ecchymosis: No Hemosiderin Staining: No Mottled: No Pallor: No Rubor: No Erythema Location: Circumferential N/A N/A Temperature: No Abnormality No Abnormality N/A Tenderness on Yes Yes N/A Palpation: Wound Preparation: Ulcer Cleansing: Ulcer Cleansing: N/A Rinsed/Irrigated with Rinsed/Irrigated with Saline Saline Topical Anesthetic Topical Anesthetic Applied: Other: lidocaine Applied: Other: lidocaine 4% 4% Treatment Notes Electronic Signature(s) Signed: 06/30/2017 5:39:18 PM By: Montey Hora Entered By: Montey Hora on 06/30/2017 15:18:01 Vohs, [name] ([id number]) -------------------------------------------------------------------------------- [place] Details Patient Name: MOZELL, HABER. Date of Service: 06/30/2017 3:00 PM Medical Record Number: [id number] Patient Account Number: [id number] Date of Birth/Sex: October 01, 1966 (51 y.o. Female) Treating RN: Montey Hora Primary Care Kenrick Pore: Delight Stare Other Clinician: Referring Melisia Leming: Delight Stare Treating Varick Keys/Extender: Cathie Olden in Treatment: 22 Active Inactive ` Orientation to the Wound Care Program Nursing Diagnoses: Knowledge deficit related to the wound healing center program Goals: Patient/caregiver will verbalize understanding of the [facility] Program Date Initiated: 01/23/2017 Target Resolution Date: 05/25/2017 Goal Status: Active Interventions: Provide education on orientation to the wound center Notes: ` Peripheral Neuropathy Nursing Diagnoses: Knowledge deficit related to disease process and management of peripheral neurovascular dysfunction Potential alteration in peripheral tissue perfusion (select prior to confirmation of diagnosis) Goals: Patient/caregiver will verbalize understanding of disease process and disease management Date Initiated: 01/23/2017 Target Resolution Date:  05/25/2017 Goal Status: Active Interventions: Assess signs and symptoms of neuropathy upon admission and as needed Provide education on Management of Neuropathy and Related Ulcers Provide education on Management of Neuropathy upon discharge from the [facility] Activities: Test ordered outside of clinic : 01/23/2017 Notes: NATACIA, CHAISSON ([id number]) ` Pressure Nursing Diagnoses: Knowledge deficit related to causes and risk factors for pressure ulcer development Knowledge deficit related to management of pressures ulcers Potential for impaired tissue integrity related to pressure, friction, moisture, and shear Goals: Patient will remain free from development of additional pressure ulcers Date Initiated: 01/23/2017 Target Resolution Date: 05/25/2017 Goal Status: Active Patient will remain free of pressure ulcers Date Initiated: 01/23/2017 Target Resolution Date: 05/25/2017 Goal Status: Active Patient/caregiver will verbalize risk factors for pressure ulcer development Date Initiated: 01/23/2017 Target Resolution Date: 05/25/2017 Goal Status: Active Patient/caregiver will verbalize understanding of pressure ulcer management Date Initiated: 01/23/2017 Target Resolution Date: 05/25/2017 Goal Status: Active Interventions: Assess: immobility, friction, shearing, incontinence upon admission and as needed Assess offloading mechanisms upon admission and as needed Assess potential for pressure ulcer upon admission and as needed Provide education on pressure ulcers Treatment Activities: Patient referred for pressure reduction/relief devices : 01/23/2017 Patient referred for seating evaluation to ensure proper offloading : 01/23/2017 Pressure reduction/relief device ordered : 01/23/2017 Notes: ` Wound/Skin Impairment Nursing Diagnoses: Impaired tissue integrity Knowledge deficit related to smoking impact on wound healing Knowledge deficit related to ulceration/compromised skin  integrity Goals: Patient/caregiver  will verbalize understanding of skin care regimen SUSA, BONES ([id number]) Date Initiated: 01/23/2017 Target Resolution Date: 05/25/2017 Goal Status: Active Ulcer/skin breakdown will have a volume reduction of 30% by week 4 Date Initiated: 01/23/2017 Target Resolution Date: 05/25/2017 Goal Status: Active Ulcer/skin breakdown will have a volume reduction of 50% by week 8 Date Initiated: 01/23/2017 Target Resolution Date: 05/25/2017 Goal Status: Active Ulcer/skin breakdown will have a volume reduction of 80% by week 12 Date Initiated: 01/23/2017 Target Resolution Date: 05/25/2017 Goal Status: Active Ulcer/skin breakdown will heal within 14 weeks Date Initiated: 01/23/2017 Target Resolution Date: 05/25/2017 Goal Status: Active Interventions: Assess patient/caregiver ability to obtain necessary supplies Assess patient/caregiver ability to perform ulcer/skin care regimen upon admission and as needed Assess ulceration(s) every visit Provide education on smoking Provide education on ulcer and skin care Treatment Activities: Patient referred to home care : 01/23/2017 Referred to DME Gwyn Hieronymus for dressing supplies : 01/23/2017 Skin care regimen initiated : 01/23/2017 Topical wound management initiated : 01/23/2017 Notes: Electronic Signature(s) Signed: 06/30/2017 5:39:18 PM By: Montey Hora Entered By: Montey Hora on 06/30/2017 15:17:39 Vandoren, Cindel S. ([id number]) -------------------------------------------------------------------------------- Pain Assessment Details Patient Name: Jamie Martyr. Date of Service: 06/30/2017 3:00 PM Medical Record Number: [id number] Patient Account Number: [id number] Date of Birth/Sex: [birth year] (51 y.o. Female) Treating RN: Montey Hora Primary Care Benjamin Merrihew: Delight Stare Other Clinician: Referring Donnalee Cellucci: Delight Stare Treating Ester Hilley/Extender: Cathie Olden in Treatment: 22 Active Problems Location of Pain  Severity and Description of Pain Patient Has Paino Yes Site Locations Pain Location: Pain in Ulcers With Dressing Change: Yes Duration of the Pain. Constant / Intermittento Constant Pain Management and Medication Current Pain Management: Electronic Signature(s) Signed: 06/30/2017 5:39:18 PM By: Montey Hora Entered By: Montey Hora on 06/30/2017 15:05:18 Dresch, [name] ([id number]) -------------------------------------------------------------------------------- Patient/Caregiver Education Details Patient Name: Jamie Martyr. Date of Service: 06/30/2017 3:00 PM Medical Record Number: [id number] Patient Account Number: [id number] Date of Birth/Gender: [birth year] (51 y.o. Female) Treating RN: Montey Hora Primary Care Physician: Delight Stare Other Clinician: Referring Physician: Delight Stare Treating Physician/Extender: Cathie Olden in Treatment: 22 Education Assessment Education Provided To: Patient Education Topics Provided Wound/Skin Impairment: Handouts: Other: wound care s ordered Methods: Explain/Verbal Responses: State content correctly Electronic Signature(s) Signed: 06/30/2017 5:39:18 PM By: Montey Hora Entered By: Montey Hora on 06/30/2017 15:23:32 Apple, Urijah S. ([id number]) -------------------------------------------------------------------------------- Wound Assessment Details Patient Name: Jamie Martyr. Date of Service: 06/30/2017 3:00 PM Medical Record Number: [id number] Patient Account Number: [id number] Date of Birth/Sex: Apr 14, 1966 (51 y.o. Female) Treating RN: Montey Hora Primary Care Sheylin Scharnhorst: Delight Stare Other Clinician: Referring Roxine Whittinghill: Delight Stare Treating Stayce Delancy/Extender: Cathie Olden in Treatment: 22 Wound Status Wound Number: 1 Primary Diabetic Wound/Ulcer of the Lower Etiology: Extremity Wound Location: Right Malleolus - Lateral Wound Open Wounding Event: Gradually Appeared Status: Date Acquired:  09/23/2016 Comorbid Anemia, Arrhythmia, Coronary Artery Weeks Of Treatment: 22 History: Disease, Hypotension, Peripheral Clustered Wound: No Arterial Disease, Type II Diabetes, Osteoarthritis Photos Photo Uploaded By: Montey Hora on 06/30/2017 16:54:30 Wound Measurements Length: (cm) 1.2 Width: (cm) 1.2 Depth: (cm) 0.2 Area: (cm) 1.131 Volume: (cm) 0.226 % Reduction in Area: 71.2% % Reduction in Volume: 88.5% Epithelialization: None Tunneling: No Undermining: No Wound Description Classification: Grade 2 Foul Odor Aft Wound Margin: Distinct, outline attached Slough/Fibrin Exudate Amount: Large Exudate Type: Serosanguineous Exudate Color: red, brown er Cleansing: No o Yes Wound Bed Granulation Amount: Large (67-100%) Exposed Structure Granulation Quality: Pink Fascia Exposed: No Necrotic Amount: Small (1-33%) Fat Layer (Subcutaneous Tissue) Exposed:  Yes ELLICE, [employer] ([id number]) Necrotic Quality: Adherent Slough Tendon Exposed: No Muscle Exposed: No Joint Exposed: No Bone Exposed: No Periwound Skin Texture Texture Color No Abnormalities Noted: No No Abnormalities Noted: No Callus: No Atrophie Blanche: No Crepitus: No Cyanosis: No Excoriation: No Ecchymosis: No Induration: Yes Erythema: Yes Rash: No Erythema Location: Circumferential Scarring: No Hemosiderin Staining: No Mottled: No Moisture Pallor: No No Abnormalities Noted: No Rubor: No Dry / Scaly: No Maceration: Yes Temperature / Pain Temperature: No Abnormality Tenderness on Palpation: Yes Wound Preparation Ulcer Cleansing: Rinsed/Irrigated with Saline Topical Anesthetic Applied: Other: lidocaine 4%, Treatment Notes Wound #1 (Right, Lateral Malleolus) 1. Cleansed with: Clean wound with Normal Saline 2. Anesthetic Topical Lidocaine 4% cream to wound bed prior to debridement 3. Peri-wound Care: Skin Prep 4. Dressing Applied: Aquacel Ag 5. Secondary Dressing Applied Bordered Foam  Dressing Electronic Signature(s) Signed: 06/30/2017 5:39:18 PM By: Montey Hora Entered By: Montey Hora on 06/30/2017 15:16:31 Kleman, [name] ([id number]) -------------------------------------------------------------------------------- Wound Assessment Details Patient Name: Jamie Martyr. Date of Service: 06/30/2017 3:00 PM Medical Record Number: [id number] Patient Account Number: [id number] Date of Birth/Sex: 11/25/1965 (51 y.o. Female) Treating RN: Montey Hora Primary Care Nachman Sundt: Delight Stare Other Clinician: Referring Rudie Sermons: Delight Stare Treating Marchia Diguglielmo/Extender: Cathie Olden in Treatment: 22 Wound Status Wound Number: 2 Primary Open Surgical Wound Etiology: Wound Location: Right Gluteus Wound Open Wounding Event: Gradually Appeared Status: Date Acquired: 09/18/2016 Comorbid Anemia, Arrhythmia, Coronary Artery Weeks Of Treatment: 22 History: Disease, Hypotension, Peripheral Clustered Wound: No Arterial Disease, Type II Diabetes, Osteoarthritis Photos Photo Uploaded By: Montey Hora on 06/30/2017 16:53:37 Wound Measurements Length: (cm) 1.4 Width: (cm) 1 Depth: (cm) 1 Area: (cm) 1.1 Volume: (cm) 1.1 % Reduction in Area: 94.3% % Reduction in Volume: 98.9% Epithelialization: None Tunneling: No Undermining: No Wound Description Full Thickness Without Exposed Classification: Support Structures Wound Margin: Distinct, outline attached Exudate Large Amount: Exudate Type: Serous Exudate Color: amber Foul Odor After Cleansing: No Slough/Fibrino No Wound Bed Granulation Amount: Large (67-100%) Exposed Structure Derrig, Jamila S. ([id number]) Granulation Quality: Red, Pink Fascia Exposed: No Necrotic Amount: Small (1-33%) Fat Layer (Subcutaneous Tissue) Exposed: Yes Necrotic Quality: Adherent Slough Tendon Exposed: No Muscle Exposed: No Joint Exposed: No Bone Exposed: No Periwound Skin Texture Texture Color No Abnormalities Noted:  No No Abnormalities Noted: No Ecchymosis: Yes Moisture No Abnormalities Noted: No Temperature / Pain Maceration: Yes Temperature: No Abnormality Tenderness on Palpation: Yes Wound Preparation Ulcer Cleansing: Rinsed/Irrigated with Saline Topical Anesthetic Applied: Other: lidocaine 4%, Treatment Notes Wound #2 (Right Gluteus) 1. Cleansed with: Clean wound with Normal Saline 2. Anesthetic Topical Lidocaine 4% cream to wound bed prior to debridement 3. Peri-wound Care: Skin Prep 4. Dressing Applied: Prisma Ag 5. Secondary Dressing Applied Bordered Foam Dressing Electronic Signature(s) Signed: 06/30/2017 5:39:18 PM By: Montey Hora Entered By: Montey Hora on 06/30/2017 15:17:02 Arp, [name] ([id number]) -------------------------------------------------------------------------------- Vitals Details Patient Name: Jamie Martyr. Date of Service: 06/30/2017 3:00 PM Medical Record Number: [id number] Patient Account Number: [id number] Date of Birth/Sex: February 13, 1966 (51 y.o. Female) Treating RN: Montey Hora Primary Care Talullah Abate: Delight Stare Other Clinician: Referring Evynn Boutelle: Delight Stare Treating Garnett Rekowski/Extender: Cathie Olden in Treatment: 22 Vital Signs Time Taken: 15:05 Temperature (F): 98.5 Height (in): 67 Pulse (bpm): 103 Weight (lbs): 137 Respiratory Rate (breaths/min): 16 Body Mass Index (BMI): 21.5 Reference Range: 80 - 120 mg / dl Electronic Signature(s) Signed: 06/30/2017 5:39:18 PM By: Montey Hora Entered By: Montey Hora on 06/30/2017 15:08:32

## 2017-07-01 NOTE — Progress Notes (Signed)
[name] ([id number]) Visit Report for 06/30/2017 Chief Complaint Document Details Patient Name: [name], [name]. Date of Service: 06/30/2017 3:00 PM Medical Record Number: [id number] Patient Account Number: [id number] Date of Birth/Sex: [birth year] (51 y.o. Female) Treating RN: Cornell Barman Primary Care Provider: Delight Stare Other Clinician: Referring Provider: Delight Stare Treating Provider/Extender: Cathie Olden in Treatment: 22 Information Obtained from: Patient Chief Complaint She is here in follow up evaluation for right ischial and right lateral malleolus Electronic Signature([name]) Signed: 06/30/2017 5:23:40 PM By: Lawanda Cousins Entered By: Lawanda Cousins on 06/30/2017 16:13:31 [name], [name] ([id number]) -------------------------------------------------------------------------------- Debridement Details Patient Name: [name]. Date of Service: 06/30/2017 3:00 PM Medical Record Number: [id number] Patient Account Number: [id number] Date of Birth/Sex: February 22, 1966 (51 y.o. Female) Treating RN: Cornell Barman Primary Care Provider: Delight Stare Other Clinician: Referring Provider: Delight Stare Treating Provider/Extender: Cathie Olden in Treatment: 22 Debridement Performed for Wound #1 Right,Lateral Malleolus Assessment: Performed By: Physician Lawanda Cousins, NP Debridement: Debridement Severity of Tissue Pre Fat layer exposed Debridement: Pre-procedure Verification/Time Out Yes - 15:24 Taken: Start Time: 15:24 Pain Control: Lidocaine 4% Topical Solution Level: Skin/Subcutaneous Tissue Total Area Debrided (L x 1.2 (cm) x 1.2 (cm) = 1.44 (cm) W): Tissue and other Viable, Non-Viable, Fibrin/Slough, Subcutaneous material debrided: Instrument: Curette Bleeding: Minimum Hemostasis Achieved: Pressure End Time: 15:26 Procedural Pain: 0 Post Procedural Pain: 0 Response to Treatment: Procedure was tolerated well Post Debridement Measurements of Total  Wound Length: (cm) 1.2 Width: (cm) 1.2 Depth: (cm) 0.3 Volume: (cm) 0.339 Character of Wound/Ulcer Post Improved Debridement: Severity of Tissue Post Debridement: Fat layer exposed Post Procedure Diagnosis Same as Pre-procedure Electronic Signature([name]) Signed: 06/30/2017 5:23:40 PM By: Lawanda Cousins Signed: 07/01/2017 7:32:57 AM By: Gretta Cool, [name], RN, CWS, [name], [name] [name], [name]. ([id number]) Entered By: Lawanda Cousins on 06/30/2017 15:57:11 [name], [name] ([id number]) -------------------------------------------------------------------------------- Debridement Details Patient Name: [name]. Date of Service: 06/30/2017 3:00 PM Medical Record Number: [id number] Patient Account Number: [id number] Date of Birth/Sex: [birth year] (51 y.o. Female) Treating RN: Cornell Barman Primary Care Provider: Delight Stare Other Clinician: Referring Provider: Delight Stare Treating Provider/Extender: Cathie Olden in Treatment: 22 Debridement Performed for Wound #2 Right Gluteus Assessment: Performed By: Physician Lawanda Cousins, NP Debridement: Debridement Pre-procedure Verification/Time Out Yes - 15:26 Taken: Start Time: 15:26 Pain Control: Lidocaine 4% Topical Solution Level: Skin/Subcutaneous Tissue Total Area Debrided (L x 1.4 (cm) x 1 (cm) = 1.4 (cm) W): Tissue and other Viable, Non-Viable, Fibrin/Slough, Subcutaneous material debrided: Instrument: Curette Bleeding: Minimum Hemostasis Achieved: Pressure End Time: 15:29 Procedural Pain: 0 Post Procedural Pain: 0 Response to Treatment: Procedure was tolerated well Post Debridement Measurements of Total Wound Length: (cm) 1.4 Width: (cm) 1 Depth: (cm) 1.1 Volume: (cm) 1.21 Character of Wound/Ulcer Post Improved Debridement: Post Procedure Diagnosis Same as Pre-procedure Electronic Signature([name]) Signed: 06/30/2017 5:23:40 PM By: Lawanda Cousins Signed: 07/01/2017 7:32:57 AM By: Gretta Cool, [name], RN, CWS, [name],  [name] Entered By: Lawanda Cousins on 06/30/2017 15:57:30 [name], [name] ([id number]) -------------------------------------------------------------------------------- HPI Details Patient Name: [name]. Date of Service: 06/30/2017 3:00 PM Medical Record Number: [id number] Patient Account Number: [id number] Date of Birth/Sex: 1966/02/01 (51 y.o. Female) Treating RN: Cornell Barman Primary Care Provider: Delight Stare Other Clinician: Referring Provider: Delight Stare Treating Provider/Extender: Cathie Olden in Treatment: 22 History of Present Illness Location: right lateral ankle and right gluteal area Quality: Patient reports experiencing a sharp pain to affected area([name]). Severity: Patient states wound are getting better slowly Duration: Patient has  had the wound for > 4 months prior to seeking treatment at the wound center Timing: Pain in wound is constant (hurts all the time) Context: The wound occurred when the patient was in hospital with a necrotizing fasciitis of the right gluteal area and a ulceration on the right ankle Modifying Factors: Other treatment([name]) tried include:admitted to the hospital for IV antibiotics and a full workup and has also had a recent angiogram Associated Signs and Symptoms: Patient reports having increase discharge. HPI Description: 51 year old patient was sent to [place] from [facility] where she was seen by Dr. Sherril Cong for a left ankle ulceration and was recently hospitalized with hypotension and sepsis. She was treated with IV antibiotics and has been scheduled to see Dr. Sharol Given. She was seen by vascular surgery who recommended a femoral bypass but surgery has been delayed until her sacral wound from last year'[name] necrotizing fasciitis has healed. Was seeing the wound care team at [facility] but wanted to change over. She is a smoker and smokes a pack of cigarettes a day The patient was recently admitted in [place] between February 2 and  February 14. She had a follow- up to see vascular surgery, orthopedic surgery and infectious disease. 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 Review of the electronic medical records reveals that Dr. 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 An MRI showed edema in the bone which was consistent with osteomyelitis and the chronic ulcer and may need surgical intervention. She also had a sacral decubitus ulcer which was treated with the wound VAC. On 01/07/2017 she was taken up by the vascular surgeon Dr. Trula Slade for an abdominal aortogram and bilateral lower extremity runoff, for a history of having bilateral femoropopliteal bypass graft as well as external iliac stenting on the right and stenting of her bypass graft. She had developed a nonhealing wound on her right ankle and there was a possibility of a femoral occlusion. the findings were noted and the impression was a surgical revascularization with a aorto bifemoral bypass graft. Both the femoropopliteal bypass grafts were patent. 01/31/2017 -- x-ray of the right hip and pelvis -- IMPRESSION:No radiographic evidence of acute osteomyelitis. Normal-appearing right hip joint space for age. No acute bony abnormality of the hip. 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 Since seeing her last week she has not had an appointment with infectious disease or the orthopedic specialist yet. [name], [name] ([id number]) 02/06/2017 -- the patient missed a couple of  appointments yesterday due to the weather but other than that has apparently given up smoking for the last 5 days. 02/13/2017 -- she has rescheduled her infectious disease appointment and also the appointment with the orthopedic surgeon Dr. Sharol Given 03/06/2017 -- she was seen by Dr. Lucianne Lei dam of infectious disease on 03/05/2017 and he recommendedIV antibiotics but the patient did not want to have that and he has given her Augmentin and doxycycline and will reevaluate her in 2 months time. 03/13/2017 -- he saw Dr. Trula Slade regarding her vascular issues and he would like to wait  to the sacral wound is completely closed before considering aortobifemoral bypass graft. He will see her back in 2 months time. She was also seen by Dr. Sharol Given of orthopedics who recommended continue wound care dressings and follow in the office in about 2 months time. he also recommended 3 view radiographs of the right ankle at follow-up. 03/28/2017 -- she did have a PICC line placed and Dr. Lucianne Lei dam has begun IV vancomycin and ceftriaxone. she is going to continue this until she sees him in approximately a month'[name] time 04/18/2017 -- we had applied for a skin substitute for the patient'[name] care but her copayment is going to be about $300 a piece and the patient will not be able to afford this. 05/08/17 on evaluation today patient appears to potentially have a fungal rash in the periwound region of the sacral area. This also seems to extend into the inguinal creases and factional region as well. She is tender to palpation with light rubbing over the region of the periwound and the skin in this region does have a beefy red appearance. Everything seems to point to a fungal infection at this time. She has been tolerating the dressing changes up to this point. There is no evidence of infection otherwise. 05/16/2017 -- was seen by Dr. 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 He did not think that she is making progress and was not able to examine her sacral decubitus ulcer as the patient would not allow it. She was switched to Bactrim DS 2 tablets twice a day since finishing her antibiotics. Her ESR was 76 and a CRP was 2.8. 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 05/23/17 on evaluation today patient'[name] wound appears to be doing about the same in regard to the ankle as well as the gluteal area. She apparently has issues with the one back staying in place and she states the sill seems to break up the inferior aspect. She does have home help coming out to apply the wound VAC. She has been tolerating the dressing changes without any problem although she has had some increased pain in the gluteal region due to a fall she sustained and she feels like she brews that area and she had pain actually radiating down her leg which is slowly beginning to improve. There'[name] no evidence of infection. 05/29/17 Unfortunately on evaluation today patient'[name] wounds both in regard to the ankle as well as in regard to her gluteal region on the right appear to be measuring the same as last week'[name] evaluation. She also tells me that she is having soreness today in regard to the right gluteal area because she had a visitor who came to see her a couple days ago and stayed for six hours and she had to set up for that entire length of time visiting. This has called some increased discomfort. Normally she does not set up for those long periods of time. Fortunately there'[name] no evidence of infection and no worsening of the wounds. 06/13/2017 -- the patient has had a lot of health issues including perfuse diarrhea and generalized weakness and I have instructed her to see her PCP as soon as possible and if things get worse to go to the ER. She has not brought her wound VAC  today. She continues to be on  oral antibiotics 06/30/17- she is here in follow up evaluation for right ischial and right lateral malleolus ulcer. She continues with negative pressure wound therapy to the ischial wound. She is currently on a diabetic therapy per HYE, TRAWICK. ([id number]) infectious disease, although she does state that she inconsistently takes it. She was advised to take her antibiotic as prescribed Electronic Signature([name]) Signed: 06/30/2017 5:23:40 PM By: Lawanda Cousins Entered By: Lawanda Cousins on 06/30/2017 16:14:32 [name], [name] ([id number]) -------------------------------------------------------------------------------- Physical Exam Details Patient Name: [name], [name]. Date of Service: 06/30/2017 3:00 PM Medical Record Number: [id number] Patient Account Number: [id number] Date of Birth/Sex: [birth year] (51 y.o. Female) Treating RN: Cornell Barman Primary Care Provider: Delight Stare Other Clinician: Referring Provider: Delight Stare Treating Provider/Extender: Cathie Olden in Treatment: 22 Constitutional afebrile. Respiratory . Musculoskeletal ambulates with a cane. Electronic Signature([name]) Signed: 06/30/2017 5:23:40 PM By: Lawanda Cousins Entered By: Lawanda Cousins on 06/30/2017 16:25:44 [name], [name] ([id number]) -------------------------------------------------------------------------------- Physician Orders Details Patient Name: [name]. Date of Service: 06/30/2017 3:00 PM Medical Record Number: [id number] Patient Account Number: [id number] Date of Birth/Sex: [birth year] (51 y.o. Female) Treating RN: Montey Hora Primary Care Provider: Delight Stare Other Clinician: Referring Provider: Delight Stare Treating Provider/Extender: Cathie Olden in Treatment: 22 Verbal / Phone Orders: No Diagnosis Coding Wound Cleansing Wound #1 Right,Lateral Malleolus o Clean wound with Normal Saline. Wound #2 Right Gluteus o Clean wound with Normal  Saline. Anesthetic Wound #1 Right,Lateral Malleolus o Topical Lidocaine 4% cream applied to wound bed prior to debridement Primary Wound Dressing Wound #1 Right,Lateral Malleolus o Aquacel Ag Wound #2 Right Gluteus o Prisma Ag o Prisma Ag - or equal under wound vac. Secondary Dressing Wound #1 Right,Lateral Malleolus o Boardered Foam Dressing Wound #2 Right Gluteus o Boardered Foam Dressing o Hydrocolloid - May protect periwound area with hydrocolloid under NPWT dressing Dressing Change Frequency Wound #1 Right,Lateral Malleolus o Change Dressing Monday, Wednesday, Friday Wound #2 Right Gluteus o Change Dressing Monday, Wednesday, Friday Follow-up Appointments Wound #1 Right,Lateral Malleolus Speyer, Jaislyn [name]. ([id number]) o Return Appointment in 1 week. Wound #2 Right Gluteus o Return Appointment in 1 week. Off-Loading Wound #1 Right,Lateral Malleolus o Turn and reposition every 2 hours Wound #2 Right Gluteus o Turn and reposition every 2 hours Home Health Wound #1 [place] Visits - Monday, Wednesday, Friday o Home Health Nurse may visit PRN to address patientos wound care needs. o FACE TO FACE ENCOUNTER: MEDICARE and MEDICAID PATIENTS: I certify that this patient is under my care and that I had a face-to-face encounter that meets the physician face-to-face encounter requirements with this patient on this date. The encounter with the patient was in whole or in part for the following MEDICAL CONDITION: (primary reason for [place]) MEDICAL NECESSITY: I certify, that based on my findings, NURSING services are a medically necessary home health service. HOME BOUND STATUS: I certify that my clinical findings support that this patient is homebound (i.e., Due to illness or injury, pt requires aid of supportive devices such as crutches, cane, wheelchairs, walkers, the use of special transportation or the  assistance of another person to leave their place of residence. There is a normal inability to leave the home and doing so requires considerable and taxing effort. Other absences are for medical reasons / religious services and are infrequent or of short duration when for other reasons). o If current dressing causes regression in wound condition, may D/C ordered dressing product/[name] and apply  Normal Saline Moist Dressing daily until next [place] / Other MD appointment. [place] of regression in wound condition at [phone]. o Please direct any NON-WOUND related issues/requests for orders to patient'[name] Primary Care Physician Wound #2 Right [place] Visits - Monday, Wednesday, Friday o Home Health Nurse may visit PRN to address patientos wound care needs. o FACE TO FACE ENCOUNTER: MEDICARE and MEDICAID PATIENTS: I certify that this patient is under my care and that I had a face-to-face encounter that meets the physician face-to-face encounter requirements with this patient on this date. The encounter with the patient was in whole or in part for the following MEDICAL CONDITION: (primary reason for [place]) MEDICAL NECESSITY: I certify, that based on my findings, NURSING services are a medically necessary home health service. HOME BOUND STATUS: I certify that my clinical findings support that this patient is homebound (i.e., Due to illness or injury, pt requires aid of supportive devices such as crutches, cane, wheelchairs, walkers, the use of special transportation or the assistance of another person to leave their place of residence. There is a normal inability to leave the home and doing so requires considerable and taxing effort. Other absences are for medical reasons / religious services and are infrequent or of short duration when for other reasons). JARYIAH, MEHLMAN ([id number]) o If current dressing causes  regression in wound condition, may D/C ordered dressing product/[name] and apply Normal Saline Moist Dressing daily until next [place] / Other MD appointment. [place] of regression in wound condition at [phone]. o Please direct any NON-WOUND related issues/requests for orders to patient'[name] Primary Care Physician Negative Pressure Wound Therapy Wound #2 Right Gluteus o Wound VAC settings at 125/130 mmHg continuous pressure. Use BLACK/GREEN foam to wound cavity. Use WHITE foam to fill any tunnel/[name] and/or undermining. Change VAC dressing 2 X WEEK. Change canister as indicated when full. Nurse may titrate settings and frequency of dressing changes as clinically indicated. - Collagen dressing under wound [place] Nurse may d/c VAC for [name]/[name] of increased infection, significant wound regression, or uncontrolled drainage. [place] at [phone]. o Apply contact layer over base of wound. o Number of foam/gauze pieces used in the dressing = o Place NPWT on HOLD. Electronic Signature([name]) Signed: 06/30/2017 5:23:40 PM By: Lawanda Cousins Signed: 06/30/2017 5:39:18 PM By: Montey Hora Entered By: Montey Hora on 06/30/2017 15:31:31 Molinelli, [name] ([id number]) -------------------------------------------------------------------------------- Problem List Details Patient Name: [name], [name]. Date of Service: 06/30/2017 3:00 PM Medical Record Number: [id number] Patient Account Number: [id number] Date of Birth/Sex: 1966/09/20 (51 y.o. Female) Treating RN: Cornell Barman Primary Care Provider: Delight Stare Other Clinician: Referring Provider: Delight Stare Treating Provider/Extender: Cathie Olden in Treatment: 22 Active Problems ICD-10 Encounter Code Description Active Date Diagnosis E11.621 Type 2 diabetes mellitus with foot ulcer 01/23/2017 Yes L89.314 Pressure ulcer of right buttock, stage 4 01/23/2017 Yes L97.312  Non-pressure chronic ulcer of right ankle with fat layer 01/23/2017 Yes exposed F17.218 Nicotine dependence, cigarettes, with other nicotine- 01/23/2017 Yes induced disorders I70.233 Atherosclerosis of native arteries of right leg with 01/23/2017 Yes ulceration of ankle M86.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, right ankle and foot 01/23/2017 Yes Inactive Problems Resolved Problems Electronic Signature([name]) Signed: 06/30/2017 5:23:40 PM By: Lawanda Cousins Entered By: Lawanda Cousins on 06/30/2017 [name], [name] ([id number]) -------------------------------------------------------------------------------- Progress Note Details Patient Name: [name]. Date of Service: 06/30/2017 3:00 PM Medical Record Number: [id number] Patient  Account Number: [id number] Date of Birth/Sex: [birth year] (51 y.o. Female) Treating RN: Cornell Barman Primary Care Provider: Delight Stare Other Clinician: Referring Provider: Delight Stare Treating Provider/Extender: Cathie Olden in Treatment: 22 Subjective Chief Complaint Information obtained from Patient She is here in follow up evaluation for right ischial and right lateral malleolus History of Present Illness (HPI) The following HPI elements were documented for the patient'[name] wound: Location: right lateral ankle and right gluteal area Quality: Patient reports experiencing a sharp pain to affected area([name]). 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 51 year old patient was sent to [place] from [facility] where she was seen by Dr. Sherril Cong for a left ankle ulceration and was recently hospitalized with hypotension and sepsis. She was treated with IV antibiotics and has been scheduled to see Dr. Sharol Given. She was seen by vascular surgery who recommended a femoral bypass but surgery has been delayed until her sacral wound from last year'[name] necrotizing fasciitis has healed. Was seeing the wound care team at [facility] but wanted to change over. She is a smoker and smokes a pack of cigarettes a day The patient was recently admitted in [place] between February 2 and February 14. She had a follow- up to see vascular surgery, orthopedic surgery and infectious disease. 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 Review of the electronic medical records reveals that Dr. 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 An MRI showed edema in the bone which was consistent with osteomyelitis and the chronic ulcer and may need surgical intervention. She also had a sacral decubitus ulcer which was treated with the wound VAC. On 01/07/2017 she was taken up by the vascular surgeon Dr. Trula Slade for an abdominal aortogram and bilateral lower extremity runoff, for a history of having bilateral femoropopliteal bypass graft as well as external iliac stenting on the right and stenting of her bypass graft. She had developed a nonhealing wound on her right ankle and there was a possibility of a femoral  occlusion. the findings were noted and the impression was a surgical revascularization with a aorto bifemoral bypass graft. Both the femoropopliteal [place], [place]. ([id number]) bypass grafts were patent. 01/31/2017 -- x-ray of the right hip and pelvis -- IMPRESSION:No radiographic evidence of acute osteomyelitis. Normal-appearing right hip joint space for age. No acute bony abnormality of the hip. 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 Since seeing her last week she has not had an appointment with infectious disease or the orthopedic specialist yet. 02/06/2017 -- the patient missed a couple of appointments yesterday  due to the weather but other than that has apparently given up smoking for the last 5 days. 02/13/2017 -- she has rescheduled her infectious disease appointment and also the appointment with the orthopedic surgeon Dr. Sharol Given 03/06/2017 -- she was seen by Dr. Lucianne Lei dam of infectious disease on 03/05/2017 and he recommendedIV antibiotics but the patient did not want to have that and he has given her Augmentin and doxycycline and will reevaluate her in 2 months time. 03/13/2017 -- he saw Dr. Trula Slade regarding her vascular issues and he would like to wait to the sacral wound is completely closed before considering aortobifemoral bypass graft. He will see her back in 2 months time. She was also seen by Dr. Sharol Given of orthopedics who recommended continue wound care dressings and follow in the office in about 2 months time. he also recommended 3 view radiographs of the right ankle at follow-up. 03/28/2017 -- she did have a PICC line placed and Dr. Lucianne Lei dam has begun IV vancomycin and ceftriaxone. she is going to continue this until she sees him in approximately a month'[name] time 04/18/2017 -- we had applied for a skin substitute for the patient'[name] care but her copayment is going to be about $300 a piece and the patient will not be able  to afford this. 05/08/17 on evaluation today patient appears to potentially have a fungal rash in the periwound region of the sacral area. This also seems to extend into the inguinal creases and factional region as well. She is tender to palpation with light rubbing over the region of the periwound and the skin in this region does have a beefy red appearance. Everything seems to point to a fungal infection at this time. She has been tolerating the dressing changes up to this point. There is no evidence of infection otherwise. 05/16/2017 -- was seen by Dr. 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 He did not think that she is making progress and was not able to examine her sacral decubitus ulcer as the patient would not allow it. She was switched to Bactrim DS 2 tablets twice a day since finishing her antibiotics. Her ESR was 76 and a CRP was 2.8. 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 05/23/17 on evaluation today patient'[name] wound appears to be doing about the same in regard to the ankle as well as the gluteal area. She apparently has issues with the one back staying in place and she states the sill seems to break up the inferior aspect. She does have home help coming out to apply the wound VAC. She has been tolerating the dressing changes without any problem although she has had some increased pain in the gluteal region due to a fall she sustained and she feels like she brews that area and she had pain actually radiating down her leg which is slowly beginning to improve. There'[name] no evidence of infection. 05/29/17 Unfortunately on evaluation today patient'[name] wounds both in regard to the ankle as well as in regard to her gluteal region on the right appear to be measuring the same as last week'[name] evaluation. She  also tells me that she is having soreness today in regard to the right gluteal area because she had a visitor who came to see her a couple days ago and stayed for six hours  and she had to set up for that entire length of time visiting. This has called some increased discomfort. Normally she does not set up for those long periods of [place], [place]. ([id number]) time. Fortunately there'[name] no evidence of infection and no worsening of the wounds. 06/13/2017 -- the patient has had a lot of health issues including perfuse diarrhea and generalized weakness and I have instructed her to see her PCP as soon as possible and if things get worse to go to the ER. She has not brought her wound VAC today. She continues to be on oral antibiotics 06/30/17- she is here in follow up evaluation for right ischial and right lateral malleolus ulcer. She continues with negative pressure wound therapy to the ischial wound. She is currently on a diabetic therapy per infectious disease, although she does state that she inconsistently takes it. She was advised to take her antibiotic as prescribed Objective Constitutional afebrile. Vitals Time Taken: 3:05 PM, Height: 67 in, Weight: 137 lbs, BMI: 21.5, Temperature: 98.5 F, Pulse: 103 bpm, Respiratory Rate: 16 breaths/min. Musculoskeletal ambulates with a cane. Integumentary (Hair, Skin) Wound #1 status is Open. Original cause of wound was Gradually Appeared. The wound is located on the Right,Lateral Malleolus. The wound measures 1.2cm length x 1.2cm width x 0.2cm depth; 1.131cm^2 area and 0.226cm^3 volume. There is Fat Layer (Subcutaneous Tissue) Exposed exposed. There is no tunneling or undermining noted. There is a large amount of serosanguineous drainage noted. The wound margin is distinct with the outline attached to the wound base. There is large (67-100%) pink granulation within the wound bed. There is a small (1-33%) amount of necrotic tissue within the wound bed  including Adherent Slough. The periwound skin appearance exhibited: Induration, Maceration, Erythema. The periwound skin appearance did not exhibit: Callus, Crepitus, Excoriation, Rash, Scarring, Dry/Scaly, Atrophie Blanche, Cyanosis, Ecchymosis, Hemosiderin Staining, Mottled, Pallor, Rubor. The surrounding wound skin color is noted with erythema which is circumferential. Periwound temperature was noted as No Abnormality. The periwound has tenderness on palpation. Wound #2 status is Open. Original cause of wound was Gradually Appeared. The wound is located on the Right Gluteus. The wound measures 1.4cm length x 1cm width x 1cm depth; 1.1cm^2 area and 1.1cm^3 volume. There is Fat Layer (Subcutaneous Tissue) Exposed exposed. There is no tunneling or undermining noted. There is a large amount of serous drainage noted. The wound margin is distinct with the outline [name], [name]. ([id number]) attached to the wound base. There is large (67-100%) red, pink granulation within the wound bed. There is a small (1-33%) amount of necrotic tissue within the wound bed including Adherent Slough. The periwound skin appearance exhibited: Maceration, Ecchymosis. Periwound temperature was noted as No Abnormality. The periwound has tenderness on palpation. 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 There was  a Skin/Subcutaneous Tissue Debridement ([id number]) debridement with total area of 1.44 sq cm performed by Lawanda Cousins, NP. with the following instrument([name]): Curette to remove Viable and Non-Viable tissue/material including Fibrin/Slough and  Subcutaneous after achieving pain control using Lidocaine 4% Topical Solution. A time out was conducted at 15:24, prior to the start of the procedure. A Minimum amount of bleeding was controlled with Pressure. The procedure was tolerated well with a pain level of 0 throughout and a pain level of 0 following the procedure. Post Debridement Measurements: 1.2cm length x 1.2cm width x 0.3cm depth; 0.339cm^3 volume. Character of Wound/Ulcer Post Debridement is improved. Severity of Tissue Post Debridement is: Fat layer exposed. Post procedure Diagnosis Wound #1: Same as Pre-Procedure Wound #2 Pre-procedure diagnosis of Wound #2 is an Open Surgical Wound located on the Right Gluteus . There was a Skin/Subcutaneous Tissue Debridement ([id number]) debridement with total area of 1.4 sq cm performed by Lawanda Cousins, NP. with the following instrument([name]): Curette to remove Viable and Non-Viable tissue/material including Fibrin/Slough and Subcutaneous after achieving pain control using Lidocaine 4% Topical Solution. A time out was conducted at 15:26, prior to the start of the procedure. A Minimum amount of bleeding was controlled with Pressure. The procedure was tolerated well with a pain level of 0 throughout and a pain level of 0 following the procedure. Post Debridement Measurements: 1.4cm length x 1cm width x [name], [name]. ([id number]) 1.1cm depth; 1.21cm^3 volume. Character of Wound/Ulcer Post Debridement is improved. Post procedure Diagnosis Wound #2: Same as Pre-Procedure Plan Wound Cleansing: Wound #1 Right,Lateral Malleolus: Clean wound with Normal Saline. Wound #2 Right Gluteus: Clean wound with Normal Saline. Anesthetic: Wound #1 Right,Lateral  Malleolus: Topical Lidocaine 4% cream applied to wound bed prior to debridement Primary Wound Dressing: Wound #1 Right,Lateral Malleolus: Aquacel Ag Wound #2 Right Gluteus: Prisma Ag Prisma Ag - or equal under wound vac. Secondary Dressing: Wound #1 Right,Lateral Malleolus: Boardered Foam Dressing Wound #2 Right Gluteus: Boardered Foam Dressing Hydrocolloid - May protect periwound area with hydrocolloid under NPWT dressing Dressing Change Frequency: Wound #1 Right,Lateral Malleolus: Change Dressing Monday, Wednesday, Friday Wound #2 Right Gluteus: Change Dressing Monday, Wednesday, Friday Follow-up Appointments: Wound #1 Right,Lateral Malleolus: Return Appointment in 1 week. Wound #2 Right Gluteus: Return Appointment in 1 week. Off-Loading: Wound #1 Right,Lateral Malleolus: Turn and reposition every 2 hours Wound #2 Right Gluteus: Turn and reposition every 2 hours Home Health: Wound #1 Right,Lateral Malleolus: Mobile Visits - Monday, Wednesday, Friday [place] Nurse may visit PRN to address patient [name] wound care needs. HAYLEY, HORN ([id number]) FACE TO FACE ENCOUNTER: MEDICARE and MEDICAID PATIENTS: I certify that this patient is under my care and that I had a face-to-face encounter that meets the physician face-to-face encounter requirements with this patient on this date. The encounter with the patient was in whole or in part for the following MEDICAL CONDITION: (primary reason for [place]) MEDICAL NECESSITY: I certify, that based on my findings, NURSING services are a medically necessary home health service. HOME BOUND STATUS: I certify that my clinical findings support that this patient is homebound (i.e., Due to illness or injury, pt requires aid of supportive devices such as crutches, cane, wheelchairs, walkers, the use of special transportation or the assistance of another person to leave their place of residence. There is a normal inability  to leave the home and doing so requires considerable and taxing effort. Other absences are for medical reasons / religious services and are infrequent or of short duration when for other reasons). If current dressing causes regression in wound condition, may D/C ordered dressing product/[name] and apply Normal Saline Moist Dressing daily until next [place] /  Other MD appointment. [place] of regression in wound condition at [phone]. Please direct any NON-WOUND related issues/requests for orders to patient'[name] Primary Care Physician Wound #2 Right Gluteus: [place] Visits - Monday, Wednesday, Friday Home Health Nurse may visit PRN to address patient [name] wound care needs. FACE TO FACE ENCOUNTER: MEDICARE and MEDICAID PATIENTS: I certify that this patient is under my care and that I had a face-to-face encounter that meets the physician face-to-face encounter requirements with this patient on this date. The encounter with the patient was in whole or in part for the following MEDICAL CONDITION: (primary reason for [place]) MEDICAL NECESSITY: I certify, that based on my findings, NURSING services are a medically necessary home health service. HOME BOUND STATUS: I certify that my clinical findings support that this patient is homebound (i.e., Due to illness or injury, pt requires aid of supportive devices such as crutches, cane, wheelchairs, walkers, the use of special transportation or the assistance of another person to leave their place of residence. There is a normal inability to leave the home and doing so requires considerable and taxing effort. Other absences are for medical reasons / religious services and are infrequent or of short duration when for other reasons). If current dressing causes regression in wound condition, may D/C ordered dressing product/[name] and apply Normal Saline Moist Dressing daily until next [place] / Other MD  appointment. [place] of regression in wound condition at [phone]. Please direct any NON-WOUND related issues/requests for orders to patient'[name] Primary Care Physician Negative Pressure Wound Therapy: Wound #2 Right Gluteus: Wound VAC settings at 125/130 mmHg continuous pressure. Use BLACK/GREEN foam to wound cavity. Use WHITE foam to fill any tunnel/[name] and/or undermining. Change VAC dressing 2 X WEEK. Change canister as indicated when full. Nurse may titrate settings and frequency of dressing changes as clinically indicated. - Collagen dressing under wound [place] Nurse may d/c VAC for [name]/[name] of increased infection, significant wound regression, or uncontrolled drainage. [place] at [phone]. Apply contact layer over base of wound. Number of foam/gauze pieces used in the dressing = Place NPWT on HOLD. 1. continue with NPWT to right ischium Plude, Kaleya [name]. ([id number]) 2. aquacel A to right lateral malleolus 3. Follow-up next week Electronic Signature([name]) Signed: 06/30/2017 5:23:40 PM By: Lawanda Cousins Entered By: Lawanda Cousins on 06/30/2017 16:28:01 Hair, [name] ([id number]) -------------------------------------------------------------------------------- SuperBill Details Patient Name: [name]. Date of Service: 06/30/2017 Medical Record Number: [id number] Patient Account Number: [id number] Date of Birth/Sex: 1966-06-10 (51 y.o. 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 F17.218 Nicotine dependence, cigarettes, with other nicotine-induced disorders I70.233  Atherosclerosis of native arteries of right leg with ulceration of ankle M86.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, right ankle and foot Facility Procedures CPT4 Code Description: [id number] 11042 - DEB SUBQ TISSUE 20 SQ CM/< ICD-10 Description Diagnosis L89.314 Pressure ulcer of right buttock, stage 4 E11.621 Type 2 diabetes mellitus with foot ulcer L97.312 Non-pressure chronic ulcer of right ankle with fat l Modifier: ayer expose Quantity: 1 d Physician Procedures CPT4 Code Description: [id number] 11042 - WC PHYS SUBQ TISS 20 SQ CM ICD-10 Description Diagnosis L89.314 Pressure ulcer of right buttock,  stage 4 E11.621 Type 2 diabetes mellitus with foot ulcer L97.312 Non-pressure chronic ulcer of right ankle with fat l Modifier: ayer expose Quantity: 1 d Electronic Signature([name]) Signed: 06/30/2017 5:23:40 PM By: Lawanda Cousins Entered By: Lawanda Cousins on 06/30/2017 45:99:77

## 2017-07-02 NOTE — Progress Notes (Signed)
Office Visit Note   Patient: [name]           Date of Birth: 07-Mar-1966           MRN: [id number] Visit Date: 06/30/2017              Requested by: [name], [address] [place], [place] [id number] PCP: [name], MD  Chief Complaint  Patient presents with  . Right Ankle - Follow-up      HPI: Patient presents in follow-up for a ischemic ulcer lateral malleolus right ankle with fibular osteomyelitis. Of note patient has been treated for sacral decubitus ulcer as well. Is following with wound center for ankle and sacral ulcers.   Patient has also recently seen vascular surgery and once her decubitus ulcer has resolved patient would be a candidate for revascularization.  ID also following. Has been told by ID as well as our office definitive treatment will be below or above knee amputation on the right. Patient currently prefers to continue with wound care. Requests visits with our office to be less frequent.  Patient denies any fever or chills. She states that she has stopped smoking this year.   Assessment & Plan: Visit Diagnoses:  1. 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 Recommended she Continue with close follow up with the wound center. Having twice monthly visits.  Follow-Up Instructions: Return if symptoms worsen or fail to improve.   Ortho Exam  Patient is alert, oriented, no adenopathy, well-dressed, normal affect, normal respiratory effort. Examination she has an antalgic gait. She does not have a palpable pulse. She has an ulcer with fibrinous tissue at the base of the wound. After debridement the base of the wound has bleeding granulation tissue the ulcers 15 mm in diameter and 3 mm deep. Epibole present. There is no exposed bone or tendon there is no purulent drainage there is  no cellulitis.   Imaging: No results found.  Labs: Lab Results  Component Value Date   HGBA1C 10.8 (H) 12/31/2016   HGBA1C 11.2 (H) 12/24/2016   HGBA1C 13.1 (H) 06/27/2011   ESRSEDRATE 13 05/12/2017   ESRSEDRATE 76 (H) 04/30/2017   ESRSEDRATE 50 (H) 04/16/2017   CRP 2.1 05/12/2017   CRP 2.8 (H) 04/30/2017   CRP <0.8 04/16/2017   REPTSTATUS 12/25/2016 FINAL 12/23/2016   GRAMSTAIN  09/30/2016    ABUNDANT WBC PRESENT,BOTH PMN AND MONONUCLEAR ABUNDANT GRAM NEGATIVE RODS ABUNDANT GRAM POSITIVE COCCI IN PAIRS IN CLUSTERS FEW GRAM POSITIVE RODS    CULT MULTIPLE SPECIES PRESENT, SUGGEST RECOLLECTION (A) 12/23/2016    Orders:  No orders of the defined types were placed in this encounter.  No orders of the defined types were placed in this encounter.    Procedures: No procedures performed  Clinical Data: No additional findings.  ROS:  All other systems negative, except as noted in the HPI. Review of Systems  Constitutional: Negative for chills and fever.  Skin: Positive for wound.    Objective: Vital Signs: Wt 149 lb (67.6 kg)   BMI 23.34 kg/m   Specialty Comments:  No specialty comments available.  PMFS History: Patient Active Problem List   Diagnosis Date Noted  . Hyperkalemia 06/26/2017  . 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 Osteomyelitis of right fibula ([place]) 03/05/2017  . Non-pressure chronic ulcer of right ankle limited to breakdown of skin ([place]) 01/17/2017  . Elevated lactic acid level   . Osteomyelitis of ankle ([place]) 12/29/2016  . Chronic pain 12/24/2016  . Buttock wound, left, subsequent encounter   . Hypotension 12/23/2016  . IDDM (insulin dependent diabetes mellitus) ([place]) 12/23/2016  . AKI (acute kidney injury) ([place]) 12/23/2016  . Malnutrition ([place]) 12/23/2016  . Thrush 12/23/2016  . Sepsis ([place]) 11/27/2016  . DM2 (diabetes mellitus, type 2) ([place]) 11/27/2016  . Chronic back  pain 11/27/2016  . Orthostatic hypotension 11/27/2016  . Demand ischemia ([place])   . Other hyperlipidemia   . Right sided weakness   . Retinal tumor of right eye   . Pressure injury of skin 10/01/2016  . Necrotizing fasciitis ([place]) 09/30/2016  . 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 Paresthesia of both hands 03/05/2016  . DDD (degenerative disc disease), lumbosacral 03/26/2015  . DDD (degenerative disc disease), cervical 03/26/2015  . Sacroiliac joint disease 03/26/2015  . Occipital neuralgia 03/26/2015  . Pain in limb-Bilateral Leg 04/25/2014  . PAD (peripheral artery disease) ([place]) 04/05/2013  . Chronic total occlusion of artery of the extremities ([place]) 02/10/2012   Past Medical History:  Diagnosis Date  . Arthritis   . Asthma   . Chronic back pain   . Depression   . Diabetes mellitus   . Diabetic neuropathy ([place])   . Family history of adverse reaction to anesthesia    mother had difficlty waking   . GERD (gastroesophageal reflux disease)   . Hyperlipidemia   . Joint pain   . Leg pain    With Walking  . Osteomyelitis of right fibula ([place]) 03/05/2017  . PAD (peripheral artery disease) ([place])   . Reflux   . Ulcer    Foot    Family History  Problem Relation Age of Onset  . Coronary artery disease Mother   . Peripheral vascular disease Mother   . Heart disease Mother        Before age 61  . Other Mother        Venous insuffiency  . Diabetes Mother   . Hyperlipidemia Mother   . Hypertension Mother   . Varicose Veins Mother   . Heart attack Mother        before age 74  . Heart disease Father   . Diabetes Father   . Diabetes Maternal Grandmother   . Diabetes Paternal Grandmother   . Diabetes Paternal Grandfather   . Diabetes Sister   . Hypertension Sister   . Diabetes Brother   . Hypertension Brother     Past Surgical History:  Procedure Laterality Date  . ABDOMINAL AORTAGRAM  June 15, 2014  . ABDOMINAL AORTAGRAM N/A 06/15/2014   Procedure:  ABDOMINAL Maxcine Ham;  Surgeon: Serafina Mitchell, MD;  Location[facility] CATH LAB;  Service: Cardiovascular;  Laterality: N/A;  . ABDOMINAL AORTAGRAM N/A 11/22/2014   Procedure: ABDOMINAL AORTAGRAM;  Surgeon: Serafina Mitchell, MD;  Location[facility] CATH LAB;  Service: Cardiovascular;  Laterality: N/A;  . ABDOMINAL AORTOGRAM W/LOWER EXTREMITY N/A 01/07/2017   Procedure: Abdominal Aortogram w/Lower Extremity;  Surgeon: Serafina Mitchell, MD;  Location: [facility] CV LAB;  Service: Cardiovascular;  Laterality: N/A;  . ARTERIAL BYPASS SURGRY  07/05/2010   Right Common Femoral to below knee popliteal BPG  . BACK SURGERY     X's  2  . CARDIAC CATHETERIZATION    .  CHOLECYSTECTOMY     Gall Bladder  . CYSTECTOMY Right    foot  . CYSTECTOMY Left    wrist  . INTERCOSTAL NERVE BLOCK  November 2015  . IR FLUORO GUIDE CV LINE RIGHT  03/20/2017  . IR US GUIDE VASC ACCESS RIGHT  03/20/2017  . IRRIGATION AND DEBRIDEMENT BUTTOCKS Right 09/30/2016   Procedure: DEBRIDEMENT RIGHT  BUTTOCK WOUND;  Surgeon: Georganna Skeans, MD;  Location: [facility];  Service: General;  Laterality: Right;  . left foot surgery    . left wrist cyst removal Left   . PERIPHERAL VASCULAR CATHETERIZATION N/A 05/07/2016   Procedure: Abdominal Aortogram;  Surgeon: Serafina Mitchell, MD;  Location: [facility] CV LAB;  Service: Cardiovascular;  Laterality: N/A;  . PERIPHERAL VASCULAR CATHETERIZATION N/A 05/07/2016   Procedure: Lower Extremity Angiography;  Surgeon: Serafina Mitchell, MD;  Location: [facility] CV LAB;  Service: Cardiovascular;  Laterality: N/A;  . PERIPHERAL VASCULAR CATHETERIZATION N/A 05/07/2016   Procedure: Aortic Arch Angiography;  Surgeon: Serafina Mitchell, MD;  Location: [facility] CV LAB;  Service: Cardiovascular;  Laterality: N/A;  . PERIPHERAL VASCULAR CATHETERIZATION N/A 05/07/2016   Procedure: Upper Extremity Angiography;  Surgeon: Serafina Mitchell, MD;  Location: [facility] CV LAB;  Service: Cardiovascular;  Laterality: N/A;  . PERIPHERAL  VASCULAR CATHETERIZATION Right 05/07/2016   Procedure: Peripheral Vascular Balloon Angioplasty;  Surgeon: Serafina Mitchell, MD;  Location: [facility] CV LAB;  Service: Cardiovascular;  Laterality: Right;  subclavian  . PERIPHERAL VASCULAR CATHETERIZATION Right 05/07/2016   Procedure: Peripheral Vascular Intervention;  Surgeon: Serafina Mitchell, MD;  Location: [facility] CV LAB;  Service: Cardiovascular;  Laterality: Right;  External  Iliac  . SKIN GRAFT Right 2012   [name] by Dr. Nils Pyle- Right and Left Ankle  . SPINE SURGERY    . TONSILLECTOMY     Social History   Occupational History  . disabled    Social History Main Topics  . Smoking status: Former Smoker    Years: 30.00    Types: Cigarettes    Quit date: 02/07/2017  . Smokeless tobacco: Never Used  . Alcohol use No  . Drug use: No  . Sexual activity: Yes    Birth control/ protection: Post-menopausal

## 2017-07-11 ENCOUNTER — Encounter: Payer: Self-pay | Admitting: Neurology

## 2017-07-11 ENCOUNTER — Encounter: Payer: Medicare Other | Admitting: Surgery

## 2017-07-11 DIAGNOSIS — E11622 Type 2 diabetes mellitus with other skin ulcer: Secondary | ICD-10-CM | POA: Diagnosis not present

## 2017-07-15 NOTE — Progress Notes (Signed)
CANNON, QUINTON ([id number]) Visit Report for 07/11/2017 Arrival Information Details Patient Name: [name], [name]. Date of Service: 07/11/2017 2:00 PM Medical Record Number: [id number] Patient Account Number: [id number] Date of Birth/Sex: 28-Dec-1965 (51 y.o. Female) Treating RN: Cornell Barman Primary Care Hiran Leard: Delight Stare Other Clinician: Referring Ramsey Guadamuz: Delight Stare Treating Millenia Waldvogel/Extender: Frann Rider in Treatment: 24 Visit Information History Since Last Visit Added or deleted any medications: No Patient Arrived: Walker Any new allergies or adverse reactions: No Arrival Time: 14:37 Had a fall or experienced change in No Accompanied By: self activities of daily living that may affect Transfer Assistance: None risk of falls: Patient Identification Verified: Yes Signs or symptoms of abuse/neglect since last No Secondary Verification Process Completed: Yes visito Patient Requires Transmission-Based No Hospitalized since last visit: No Precautions: Has Dressing in Place as Prescribed: Yes Patient Has Alerts: No Pain Present Now: No Electronic Signature(s) Signed: 07/15/2017 7:55:48 AM By: Gretta Cool, [name], RN, CWS, [name], [name] Entered By: Gretta Cool, [name], RN, CWS, Kim on 07/11/2017 14:38:18 Morado, [name] ([id number]) -------------------------------------------------------------------------------- Clinic Level of Care Assessment Details Patient Name: [name], [name]. Date of Service: 07/11/2017 2:00 PM Medical Record Number: [id number] Patient Account Number: [id number] Date of Birth/Sex: November 24, 1965 (51 y.o. Female) Treating RN: Cornell Barman Primary Care Caterine Mcmeans: Delight Stare Other Clinician: Referring Myriam Brandhorst: Delight Stare Treating Quintavis Brands/Extender: Frann Rider in Treatment: 24 Clinic Level of Care Assessment Items TOOL 4 Quantity Score []  - Use when only an EandM is performed on FOLLOW-UP visit 0 ASSESSMENTS - Nursing Assessment / Reassessment []  -  Reassessment of Co-morbidities (includes updates in patient status) 0 X - Reassessment of Adherence to Treatment Plan 1 5 ASSESSMENTS - Wound and Skin Assessment / Reassessment []  - Simple Wound Assessment / Reassessment - one wound 0 X - Complex Wound Assessment / Reassessment - multiple wounds 2 5 []  - Dermatologic / Skin Assessment (not related to wound area) 0 ASSESSMENTS - Focused Assessment []  - Circumferential Edema Measurements - multi extremities 0 []  - Nutritional Assessment / Counseling / Intervention 0 []  - Lower Extremity Assessment (monofilament, tuning fork, pulses) 0 []  - Peripheral Arterial Disease Assessment (using hand held doppler) 0 ASSESSMENTS - Ostomy and/or Continence Assessment and Care []  - Incontinence Assessment and Management 0 []  - Ostomy Care Assessment and Management (repouching, etc.) 0 PROCESS - Coordination of Care X - Simple Patient / Family Education for ongoing care 1 15 []  - Complex (extensive) Patient / Family Education for ongoing care 0 X - Staff obtains Programmer, systems, Records, Test Results / Process Orders 1 10 []  - Staff telephones HHA, Nursing Homes / Clarify orders / etc 0 []  - Routine Transfer to another Facility (non-emergent condition) 0 Walters, Kalina S. ([id number]) []  - Routine Hospital Admission (non-emergent condition) 0 []  - New Admissions / Biomedical engineer / Ordering NPWT, Apligraf, etc. 0 []  - Emergency Hospital Admission (emergent condition) 0 X - Simple Discharge Coordination 1 10 []  - Complex (extensive) Discharge Coordination 0 PROCESS - Special Needs []  - Pediatric / Minor Patient Management 0 []  - Isolation Patient Management 0 []  - Hearing / Language / Visual special needs 0 []  - Assessment of Community assistance (transportation, D/C planning, etc.) 0 []  - Additional assistance / Altered mentation 0 []  - Support Surface(s) Assessment (bed, cushion, seat, etc.) 0 INTERVENTIONS - Wound Cleansing / Measurement []  -  Simple Wound Cleansing - one wound 0 X - Complex Wound Cleansing - multiple wounds 2 5 X - Wound Imaging (photographs - any  number of wounds) 1 5 []  - Wound Tracing (instead of photographs) 0 []  - Simple Wound Measurement - one wound 0 X - Complex Wound Measurement - multiple wounds 2 5 INTERVENTIONS - Wound Dressings []  - Small Wound Dressing one or multiple wounds 0 X - Medium Wound Dressing one or multiple wounds 2 15 []  - Large Wound Dressing one or multiple wounds 0 []  - Application of Medications - topical 0 []  - Application of Medications - injection 0 INTERVENTIONS - Miscellaneous []  - External ear exam 0 Potash, Le S. ([id number]) []  - Specimen Collection (cultures, biopsies, blood, body fluids, etc.) 0 []  - Specimen(s) / Culture(s) sent or taken to Lab for analysis 0 []  - Patient Transfer (multiple staff / Civil Service fast streamer / Similar devices) 0 []  - Simple Staple / Suture removal (25 or less) 0 []  - Complex Staple / Suture removal (26 or more) 0 []  - Hypo / Hyperglycemic Management (close monitor of Blood Glucose) 0 []  - Ankle / Brachial Index (ABI) - do not check if billed separately 0 X - Vital Signs 1 5 Has the patient been seen at the hospital within the last three years: Yes Total Score: 110 Level Of Care: New/Established - Level 3 Electronic Signature(s) Signed: 07/15/2017 7:55:48 AM By: Gretta Cool, [name], RN, CWS, [name], [name] Entered By: Gretta Cool, [name], RN, CWS, Kim on 07/11/2017 15:02:37 Gaeta, [name] ([id number]) -------------------------------------------------------------------------------- Encounter Discharge Information Details Patient Name: [name], [name]. Date of Service: 07/11/2017 2:00 PM Medical Record Number: [id number] Patient Account Number: [id number] Date of Birth/Sex: 1966-03-23 (51 y.o. Female) Treating RN: Cornell Barman Primary Care Mikeria Valin: Delight Stare Other Clinician: Referring Berkeley Veldman: Delight Stare Treating Venera Privott/Extender: Frann Rider in  Treatment: 24 Encounter Discharge Information Items Discharge Pain Level: 0 Discharge Condition: Stable Ambulatory Status: Walker Discharge Destination: Home Transportation: Private Auto Accompanied By: self Schedule Follow-up Appointment: Yes Medication Reconciliation completed Yes and provided to Patient/Care Danisa Kopec: Provided on Clinical Summary of Care: 07/11/2017 Form Type Recipient Paper Patient RM Electronic Signature(s) Signed: 07/15/2017 7:55:48 AM By: Gretta Cool, [name], RN, CWS, [name], [name] Entered By: Gretta Cool, [name], RN, CWS, Kim on 07/11/2017 15:03:22 Issac, [name] ([id number]) -------------------------------------------------------------------------------- Lower Extremity Assessment Details Patient Name: JANESIA, JOSWICK. Date of Service: 07/11/2017 2:00 PM Medical Record Number: [id number] Patient Account Number: [id number] Date of Birth/Sex: [birth year] (51 y.o. Female) Treating RN: Cornell Barman Primary Care Annett Boxwell: Delight Stare Other Clinician: Referring Neeya Prigmore: Delight Stare Treating Kanaya Gunnarson/Extender: Frann Rider in Treatment: 24 Vascular Assessment Pulses: Dorsalis Pedis Palpable: [Right:Yes] Posterior Tibial Extremity colors, hair growth, and conditions: Extremity Color: [Right:Normal] Hair Growth on Extremity: [Right:Yes] Temperature of Extremity: [Right:Warm] Capillary Refill: [Right:> 3 seconds] Dependent Rubor: [Right:No] Blanched when Elevated: [Right:No] Toe Nail Assessment Left: Right: Thick: Yes Discolored: Yes Deformed: Yes Improper Length and Hygiene: Yes Electronic Signature(s) Signed: 07/15/2017 7:55:48 AM By: Gretta Cool, [name], RN, CWS, [name], [name] Entered By: Gretta Cool, [name], RN, CWS, Kim on 07/11/2017 14:45:36 Lewinski, [name] ([id number]) -------------------------------------------------------------------------------- Multi Wound Chart Details Patient Name: [name] Date of Service: 07/11/2017 2:00 PM Medical Record Number:  [id number] Patient Account Number: [id number] Date of Birth/Sex: [birth year] (51 y.o. Female) Treating RN: Cornell Barman Primary Care Hatley Henegar: Delight Stare Other Clinician: Referring Dallana Mavity: Delight Stare Treating Mallerie Blok/Extender: Frann Rider in Treatment: 24 Vital Signs Height(in): 67 Pulse(bpm): 78 Weight(lbs): 137 Blood Pressure (mmHg): Body Mass Index(BMI): 21 Temperature(F): 98.0 Respiratory Rate 16 (breaths/min): Photos: [1:No Photos] [2:No Photos] [N/A:N/A] Wound Location: [1:Right Malleolus - Lateral] [2:Right Gluteus] [N/A:N/A]  Wounding Event: [1:Gradually Appeared] [2:Gradually Appeared] [N/A:N/A] Primary Etiology: [1:Diabetic Wound/Ulcer of the Lower Extremity] [2:Open Surgical Wound] [N/A:N/A] Comorbid History: [1:Anemia, Arrhythmia, Coronary Artery Disease, Hypotension, Peripheral Arterial Disease, Type II Diabetes, Osteoarthritis] [2:Anemia, Arrhythmia, Coronary Artery Disease, Hypotension, Peripheral Arterial Disease, Type II Diabetes,  Osteoarthritis] [N/A:N/A] Date Acquired: [1:09/23/2016] [2:09/18/2016] [N/A:N/A] Weeks of Treatment: [1:24] [2:24] [N/A:N/A] Wound Status: [1:Open] [2:Open] [N/A:N/A] Measurements L x W x D 1.1x1.5x0.4 [2:2.5x1.3x1.2] [N/A:N/A] (cm) Area (cm) : [1:1.296] [2:2.553] [N/A:N/A] Volume (cm) : [1:0.518] [2:3.063] [N/A:N/A] % Reduction in Area: [1:67.00%] [2:86.70%] [N/A:N/A] % Reduction in Volume: 73.60% [2:96.80%] [N/A:N/A] Starting Position 1 10 (o'clock): Ending Position 1 [1:5] (o'clock): Maximum Distance 1 0.8 (cm): Undermining: [1:Yes] [2:No] [N/A:N/A] Classification: [1:Grade 2] [2:Full Thickness Without Exposed Support Structures] [N/A:N/A] Exudate Amount: [1:Large] [2:Large] [N/A:N/A] Exudate Type: Serosanguineous Serous N/A Exudate Color: red, brown amber N/A Wound Margin: Distinct, outline attached Distinct, outline attached N/A Granulation Amount: Large (67-100%) Large (67-100%) N/A Granulation Quality: Pink  Red, Pink N/A Necrotic Amount: Small (1-33%) Small (1-33%) N/A Exposed Structures: Fat Layer (Subcutaneous Fat Layer (Subcutaneous N/A Tissue) Exposed: Yes Tissue) Exposed: Yes Fascia: No Fascia: No Tendon: No Tendon: No Muscle: No Muscle: No Joint: No Joint: No Bone: No Bone: No Epithelialization: None None N/A Periwound Skin Texture: Induration: Yes No Abnormalities Noted N/A Excoriation: No Callus: No Crepitus: No Rash: No Scarring: No Periwound Skin Maceration: Yes Maceration: Yes N/A Moisture: Dry/Scaly: No Periwound Skin Color: Erythema: Yes Ecchymosis: Yes N/A Atrophie Blanche: No Cyanosis: No Ecchymosis: No Hemosiderin Staining: No Mottled: No Pallor: No Rubor: No Erythema Location: Circumferential N/A N/A Temperature: No Abnormality No Abnormality N/A Tenderness on Yes Yes N/A Palpation: Wound Preparation: Ulcer Cleansing: Ulcer Cleansing: N/A Rinsed/Irrigated with Rinsed/Irrigated with Saline Saline Topical Anesthetic Topical Anesthetic Applied: Other: lidocaine Applied: Other: lidocaine 4% 4% Treatment Notes Wound #1 (Right, Lateral Malleolus) 1. Cleansed with: Clean wound with Normal Saline 2. Anesthetic Topical Lidocaine 4% cream to wound bed prior to debridement 3. Peri-wound Care: [name], [name] ([id number]) Skin Prep 4. Dressing Applied: Aquacel Ag 5. Secondary Dressing Applied Bordered Foam Dressing Wound #2 (Right Gluteus) 1. Cleansed with: Clean wound with Normal Saline 2. Anesthetic Topical Lidocaine 4% cream to wound bed prior to debridement 3. Peri-wound Care: Skin Prep 4. Dressing Applied: Aquacel Ag 5. Secondary Dressing Applied Bordered Foam Dressing Electronic Signature(s) Signed: 07/11/2017 3:48:30 PM By: Christin Fudge MD, FACS Entered By: Christin Fudge on 07/11/2017 15:23:22 Lawler, [name] ([id number]) -------------------------------------------------------------------------------- [facility]  Details Patient Name: [name], [name]. Date of Service: 07/11/2017 2:00 PM Medical Record Number: [id number] Patient Account Number: [id number] Date of Birth/Sex: [birth year] (51 y.o. Female) Treating RN: Cornell Barman Primary Care Olamide Lahaie: Delight Stare Other Clinician: Referring Anari Evitt: Delight Stare Treating Kemaya Dorner/Extender: Frann Rider in Treatment: 24 Active Inactive ` Orientation to the Wound Care Program Nursing Diagnoses: Knowledge deficit related to the wound healing center program Goals: Patient/caregiver will verbalize understanding of the [facility] Program Date Initiated: 01/23/2017 Target Resolution Date: 05/25/2017 Goal Status: Active Interventions: Provide education on orientation to the wound center Notes: ` Peripheral Neuropathy Nursing Diagnoses: Knowledge deficit related to disease process and management of peripheral neurovascular dysfunction Potential alteration in peripheral tissue perfusion (select prior to confirmation of diagnosis) Goals: Patient/caregiver will verbalize understanding of disease process and disease management Date Initiated: 01/23/2017 Target Resolution Date: 05/25/2017 Goal Status: Active Interventions: Assess signs and symptoms of neuropathy upon admission and as needed Provide education on Management of Neuropathy and Related Ulcers Provide education  on Management of Neuropathy upon discharge from the [facility]: Test ordered outside of clinic : 01/23/2017 Notes: [name], [name] ([id number]) ` Pressure Nursing Diagnoses: Knowledge deficit related to causes and risk factors for pressure ulcer development Knowledge deficit related to management of pressures ulcers Potential for impaired tissue integrity related to pressure, friction, moisture, and shear Goals: Patient will remain free from development of additional pressure ulcers Date Initiated: 01/23/2017 Target Resolution Date: 05/25/2017 Goal  Status: Active Patient will remain free of pressure ulcers Date Initiated: 01/23/2017 Target Resolution Date: 05/25/2017 Goal Status: Active Patient/caregiver will verbalize risk factors for pressure ulcer development Date Initiated: 01/23/2017 Target Resolution Date: 05/25/2017 Goal Status: Active Patient/caregiver will verbalize understanding of pressure ulcer management Date Initiated: 01/23/2017 Target Resolution Date: 05/25/2017 Goal Status: Active Interventions: Assess: immobility, friction, shearing, incontinence upon admission and as needed Assess offloading mechanisms upon admission and as needed Assess potential for pressure ulcer upon admission and as needed Provide education on pressure ulcers Treatment Activities: Patient referred for pressure reduction/relief devices : 01/23/2017 Patient referred for seating evaluation to ensure proper offloading : 01/23/2017 Pressure reduction/relief device ordered : 01/23/2017 Notes: ` Wound/Skin Impairment Nursing Diagnoses: Impaired tissue integrity Knowledge deficit related to smoking impact on wound healing Knowledge deficit related to ulceration/compromised skin integrity Goals: Patient/caregiver will verbalize understanding of skin care regimen [name], [name] ([id number]) Date Initiated: 01/23/2017 Target Resolution Date: 05/25/2017 Goal Status: Active Ulcer/skin breakdown will have a volume reduction of 30% by week 4 Date Initiated: 01/23/2017 Target Resolution Date: 05/25/2017 Goal Status: Active Ulcer/skin breakdown will have a volume reduction of 50% by week 8 Date Initiated: 01/23/2017 Target Resolution Date: 05/25/2017 Goal Status: Active Ulcer/skin breakdown will have a volume reduction of 80% by week 12 Date Initiated: 01/23/2017 Target Resolution Date: 05/25/2017 Goal Status: Active Ulcer/skin breakdown will heal within 14 weeks Date Initiated: 01/23/2017 Target Resolution Date: 05/25/2017 Goal Status: Active Interventions: Assess  patient/caregiver ability to obtain necessary supplies Assess patient/caregiver ability to perform ulcer/skin care regimen upon admission and as needed Assess ulceration(s) every visit Provide education on smoking Provide education on ulcer and skin care Treatment Activities: Patient referred to home care : 01/23/2017 Referred to DME Keimani Laufer for dressing supplies : 01/23/2017 Skin care regimen initiated : 01/23/2017 Topical wound management initiated : 01/23/2017 Notes: Electronic Signature(s) Signed: 07/15/2017 7:55:48 AM By: Gretta Cool, [name], RN, CWS, [name], [name] Entered By: Gretta Cool, [name], RN, CWS, Kim on 07/11/2017 14:45:43 Bevan, [name] ([id number]) -------------------------------------------------------------------------------- Pain Assessment Details Patient Name: LLANA, DESHAZO. Date of Service: 07/11/2017 2:00 PM Medical Record Number: [id number] Patient Account Number: [id number] Date of Birth/Sex: [birth year] (51 y.o. Female) Treating RN: Cornell Barman Primary Care Shadasia Oldfield: Delight Stare Other Clinician: Referring Tacora Athanas: Delight Stare Treating Meaghen Vecchiarelli/Extender: Frann Rider in Treatment: 24 Active Problems Location of Pain Severity and Description of Pain Patient Has Paino No Site Locations With Dressing Change: No Pain Management and Medication Current Pain Management: Electronic Signature(s) Signed: 07/15/2017 7:55:48 AM By: Gretta Cool, [name], RN, CWS, [name], [name] Entered By: Gretta Cool, [name], RN, CWS, Kim on 07/11/2017 14:38:26 Munn, [name] ([id number]) -------------------------------------------------------------------------------- Patient/Caregiver Education Details Patient Name: [name] Date of Service: 07/11/2017 2:00 PM Medical Record Number: [id number] Patient Account Number: [id number] Date of Birth/Gender: 1966-09-24 (51 y.o. Female) Treating RN: Cornell Barman Primary Care Physician: Delight Stare Other Clinician: Referring Physician: Delight Stare Treating  Physician/Extender: Frann Rider in Treatment: 24 Education Assessment Education Provided To: Patient Education Topics Provided Smoking and Wound Healing: Handouts:  Smoking and Wound Healing Methods: Explain/Verbal Responses: State content correctly Wound/Skin Impairment: Handouts: Caring for Your Ulcer Methods: Demonstration Responses: State content correctly Electronic Signature(s) Signed: 07/15/2017 7:55:48 AM By: Gretta Cool, [name], RN, CWS, [name], [name] Entered By: Gretta Cool, [name], RN, CWS, Kim on 07/11/2017 15:03:47 Lubin, [name] ([id number]) -------------------------------------------------------------------------------- Wound Assessment Details Patient Name: [name], [name]. Date of Service: 07/11/2017 2:00 PM Medical Record Number: [id number] Patient Account Number: [id number] Date of Birth/Sex: 05-Mar-1966 (51 y.o. Female) Treating RN: Cornell Barman Primary Care Waleska Buttery: Delight Stare Other Clinician: Referring Okey Zelek: Delight Stare Treating Reyanne Hussar/Extender: Frann Rider in Treatment: 24 Wound Status Wound Number: 1 Primary Diabetic Wound/Ulcer of the Lower Etiology: Extremity Wound Location: Right Malleolus - Lateral Wound Open Wounding Event: Gradually Appeared Status: Date Acquired: 09/23/2016 Comorbid Anemia, Arrhythmia, Coronary Artery Weeks Of Treatment: 24 History: Disease, Hypotension, Peripheral Clustered Wound: No Arterial Disease, Type II Diabetes, Osteoarthritis Photos Photo Uploaded By: Gretta Cool, [name], RN, CWS, Kim on 07/11/2017 16:01:55 Wound Measurements Length: (cm) 1.1 Width: (cm) 1.5 Depth: (cm) 0.4 Area: (cm) 1.296 Volume: (cm) 0.518 % Reduction in Area: 67% % Reduction in Volume: 73.6% Epithelialization: None Tunneling: No Undermining: Yes Starting Position (o'clock): 10 Ending Position (o'clock): 5 Maximum Distance: (cm) 0.8 Wound Description Classification: Grade 2 Wound Margin: Distinct, outline attached Exudate Amount:  Large Exudate Type: Serosanguineous Exudate Color: red, brown Foul Odor After Cleansing: No Slough/Fibrino Yes Wound Bed Granulation Amount: Large (67-100%) Exposed Structure [name], [name] ([id number]) Granulation Quality: Pink Fascia Exposed: No Necrotic Amount: Small (1-33%) Fat Layer (Subcutaneous Tissue) Exposed: Yes Necrotic Quality: Adherent Slough Tendon Exposed: No Muscle Exposed: No Joint Exposed: No Bone Exposed: No Periwound Skin Texture Texture Color No Abnormalities Noted: No No Abnormalities Noted: No Callus: No Atrophie Blanche: No Crepitus: No Cyanosis: No Excoriation: No Ecchymosis: No Induration: Yes Erythema: Yes Rash: No Erythema Location: Circumferential Scarring: No Hemosiderin Staining: No Mottled: No Moisture Pallor: No No Abnormalities Noted: No Rubor: No Dry / Scaly: No Maceration: Yes Temperature / Pain Temperature: No Abnormality Tenderness on Palpation: Yes Wound Preparation Ulcer Cleansing: Rinsed/Irrigated with Saline Topical Anesthetic Applied: Other: lidocaine 4%, Treatment Notes Wound #1 (Right, Lateral Malleolus) 1. Cleansed with: Clean wound with Normal Saline 2. Anesthetic Topical Lidocaine 4% cream to wound bed prior to debridement 3. Peri-wound Care: Skin Prep 4. Dressing Applied: Aquacel Ag 5. Secondary Dressing Applied Bordered Foam Dressing Electronic Signature(s) Signed: 07/15/2017 7:55:48 AM By: Gretta Cool, [name], RN, CWS, [name], [name] Entered By: Gretta Cool, [name], RN, CWS, Kim on 07/11/2017 14:44:18 Boulais, [name] ([id number]) -------------------------------------------------------------------------------- Wound Assessment Details Patient Name: [name], [name]. Date of Service: 07/11/2017 2:00 PM Medical Record Number: [id number] Patient Account Number: [id number] Date of Birth/Sex: 1966/07/18 (51 y.o. Female) Treating RN: Cornell Barman Primary Care Glendoris Nodarse: Delight Stare Other Clinician: Referring Elaria Osias: Delight Stare Treating Clarice Bonaventure/Extender: Frann Rider in Treatment: 24 Wound Status Wound Number: 2 Primary Open Surgical Wound Etiology: Wound Location: Right Gluteus Wound Open Wounding Event: Gradually Appeared Status: Date Acquired: 09/18/2016 Comorbid Anemia, Arrhythmia, Coronary Artery Weeks Of Treatment: 24 History: Disease, Hypotension, Peripheral Clustered Wound: No Arterial Disease, Type II Diabetes, Osteoarthritis Photos Photo Uploaded By: Gretta Cool, [name], RN, CWS, Kim on 07/11/2017 16:02:07 Wound Measurements Length: (cm) 2.5 Width: (cm) 1.3 Depth: (cm) 1.2 Area: (cm) 2.553 Volume: (cm) 3.063 % Reduction in Area: 86.7% % Reduction in Volume: 96.8% Epithelialization: None Tunneling: No Undermining: No Wound Description Full Thickness Without Exposed Classification: Support Structures Wound Margin: Distinct, outline attached Exudate Large Amount: Exudate Type:  Serous Exudate Color: amber Foul Odor After Cleansing: No Slough/Fibrino No Wound Bed Granulation Amount: Large (67-100%) Exposed Structure Granulation Quality: Red, Pink Fascia Exposed: No Necrotic Amount: Small (1-33%) Fat Layer (Subcutaneous Tissue) Exposed: Yes Scherer, Floetta S. ([id number]) Necrotic Quality: Adherent Slough Tendon Exposed: No Muscle Exposed: No Joint Exposed: No Bone Exposed: No Periwound Skin Texture Texture Color No Abnormalities Noted: No No Abnormalities Noted: No Ecchymosis: Yes Moisture No Abnormalities Noted: No Temperature / Pain Maceration: Yes Temperature: No Abnormality Tenderness on Palpation: Yes Wound Preparation Ulcer Cleansing: Rinsed/Irrigated with Saline Topical Anesthetic Applied: Other: lidocaine 4%, Treatment Notes Wound #2 (Right Gluteus) 1. Cleansed with: Clean wound with Normal Saline 2. Anesthetic Topical Lidocaine 4% cream to wound bed prior to debridement 3. Peri-wound Care: Skin Prep 4. Dressing Applied: Aquacel Ag 5. Secondary  Dressing Applied Bordered Foam Dressing Electronic Signature(s) Signed: 07/15/2017 7:55:48 AM By: Gretta Cool, [name], RN, CWS, [name], [name] Entered By: Gretta Cool, [name], RN, CWS, Kim on 07/11/2017 14:44:47 Schnepf, [name] ([id number]) -------------------------------------------------------------------------------- Vitals Details Patient Name: [name], [name]. Date of Service: 07/11/2017 2:00 PM Medical Record Number: [id number] Patient Account Number: [id number] Date of Birth/Sex: 06-25-1966 (51 y.o. Female) Treating RN: Cornell Barman Primary Care Jalayah Gutridge: Delight Stare Other Clinician: Referring Christionna Poland: Delight Stare Treating Tavious Griesinger/Extender: Frann Rider in Treatment: 24 Vital Signs Time Taken: 14:38 Temperature (F): 98.0 Height (in): 67 Pulse (bpm): 78 Weight (lbs): 137 Respiratory Rate (breaths/min): 16 Body Mass Index (BMI): 21.5 Reference Range: 80 - 120 mg / dl Electronic Signature(s) Signed: 07/15/2017 7:55:48 AM By: Gretta Cool, [name], RN, CWS, [name], [name] Entered By: Gretta Cool, [name], RN, CWS, Kim on 07/11/2017 14:39:12

## 2017-07-15 NOTE — Progress Notes (Signed)
Jamie, [name] ([id number]) Visit Report for 07/11/2017 Chief Complaint Document Details Patient Name: [name], [name]. Date of Service: 07/11/2017 2:00 PM Medical Record Number: [id number] Patient Account Number: [id number] Date of Birth/Sex: 1966/03/28 (51 y.o. Female) Treating RN: Cornell Barman Primary Care Provider: Delight Stare Other Clinician: Referring Provider: Delight Stare Treating Provider/Extender: Frann Rider in Treatment: 24 Information Obtained from: Patient Chief Complaint She is here in follow up evaluation for right ischial and right lateral malleolus Electronic Signature(s) Signed: 07/11/2017 3:48:30 PM By: Christin Fudge MD, FACS Entered By: Christin Fudge on 07/11/2017 15:23:34 Gwyn, [name] ([id number]) -------------------------------------------------------------------------------- HPI Details Patient Name: Jamie, [name]. Date of Service: 07/11/2017 2:00 PM Medical Record Number: [id number] Patient Account Number: [id number] Date of Birth/Sex: November 01, 1966 (51 y.o. Female) Treating RN: Cornell Barman Primary Care Provider: Delight Stare Other Clinician: Referring Provider: Delight Stare Treating Provider/Extender: Frann Rider in Treatment: 24 History of Present Illness Location: right lateral ankle and right gluteal area Quality: Patient reports experiencing a sharp pain to affected area(s). Severity: Patient states wound are getting better slowly Duration: Patient has had the wound for > 4 months prior to seeking treatment at the wound center Timing: Pain in wound is constant (hurts all the time) Context: The wound occurred when the patient was in hospital with a necrotizing fasciitis of the right gluteal area and a ulceration on the right ankle Modifying Factors: Other treatment(s) tried include:admitted to the hospital for IV antibiotics and a full workup and has also had a recent angiogram Associated Signs and Symptoms: Patient reports having  increase discharge. HPI Description: 51 year old patient was sent to [place] from [facility] where she was seen by Dr. Sherril Cong for a left ankle ulceration and was recently hospitalized with hypotension and sepsis. She was treated with IV antibiotics and has been scheduled to see Dr. Sharol Given. She was seen by vascular surgery who recommended a femoral bypass but surgery has been delayed until her sacral wound from last year's necrotizing fasciitis has healed. Was seeing the wound care team at [facility] but wanted to change over. She is a smoker and smokes a pack of cigarettes a day The patient was recently admitted in [place] between February 2 and February 14. She had a follow- up to see vascular surgery, orthopedic surgery and infectious disease. 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 Review of the electronic medical records reveals that Dr. 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 An MRI showed edema in the bone which was consistent with osteomyelitis and the chronic ulcer and may need surgical intervention. She also had a sacral decubitus ulcer which was treated with the wound VAC. On 01/07/2017 she was taken up by the vascular surgeon Dr. Trula Slade for an abdominal aortogram and bilateral lower extremity runoff, for a history of having bilateral femoropopliteal bypass graft as well as external iliac stenting on the right and stenting of her bypass graft. She had developed a nonhealing wound on her right ankle and there was a possibility of a femoral  occlusion. the findings were noted and the impression was a surgical revascularization with a aorto  bifemoral bypass graft. Both the femoropopliteal bypass grafts were patent. 01/31/2017 -- x-ray of the right hip and pelvis -- IMPRESSION:No radiographic evidence of acute osteomyelitis. Normal-appearing right hip joint space for age. No acute bony abnormality of the hip. 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 Since seeing her last week she has not had an appointment with infectious disease or the orthopedic specialist yet. Jamie, [name] ([id number]) 02/06/2017 -- the patient missed a couple of appointments yesterday due to the weather but other than that has apparently given up smoking for the last 5 days. 02/13/2017 -- she has rescheduled her infectious disease appointment and also the appointment with the orthopedic surgeon Dr. Sharol Given 03/06/2017 -- she was seen by Dr. Lucianne Lei dam of infectious disease on 03/05/2017 and he recommendedIV antibiotics but the patient did not want to have that and he has given her Augmentin and doxycycline and will reevaluate her in 2 months time. 03/13/2017 -- he saw Dr. Trula Slade regarding her vascular issues and he would like to wait to the sacral wound is completely closed before considering aortobifemoral bypass graft. He will see her back in 2 months time. She was also seen by Dr. Sharol Given of orthopedics who recommended continue wound care dressings and follow in the office in about 2 months time. he also recommended 3 view radiographs of the right ankle at follow-up. 03/28/2017 -- she did have a PICC line placed and Dr. Lucianne Lei dam has begun IV vancomycin and ceftriaxone. she is going to continue this until she sees him in approximately a month's time 04/18/2017 -- we had applied for a skin substitute for the patient's care but her copayment is going to be about $300 a piece and the patient will not be able  to afford this. 05/08/17 on evaluation today patient appears to potentially have a fungal rash in the periwound region of the sacral area. This also seems to extend into the inguinal creases and factional region as well. She is tender to palpation with light rubbing over the region of the periwound and the skin in this region does have a beefy red appearance. Everything seems to point to a fungal infection at this time. She has been tolerating the dressing changes up to this point. There is no evidence of infection otherwise. 05/16/2017 -- was seen by Dr. 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 He did not think that she is making progress and was not able to examine her sacral decubitus ulcer as the patient would not allow it. She was switched to Bactrim DS 2 tablets twice a day since finishing her antibiotics. Her ESR was 76 and a CRP was 2.8. 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 05/23/17 on evaluation today patient's wound appears to be doing about the same in regard to the ankle as well as the gluteal area. She apparently has issues with the one back staying in place and she states the sill seems to break up the inferior aspect. She does have home help coming out to apply the wound VAC. She has been tolerating the dressing changes without any problem although she has had some increased pain in the gluteal region due to a fall she sustained and she feels like she brews that area  and she had pain actually radiating down her leg which is slowly beginning to improve. There's no evidence of infection. 05/29/17 Unfortunately on evaluation today patient's wounds both in regard to the ankle as well as in regard to her gluteal region on the right appear to be measuring the same as last week's evaluation. She  also tells me that she is having soreness today in regard to the right gluteal area because she had a visitor who came to see her a couple days ago and stayed for six hours and she had to set up for that entire length of time visiting. This has called some increased discomfort. Normally she does not set up for those long periods of time. Fortunately there's no evidence of infection and no worsening of the wounds. 06/13/2017 -- the patient has had a lot of health issues including perfuse diarrhea and generalized weakness and I have instructed her to see her PCP as soon as possible and if things get worse to go to the ER. She has not brought her wound VAC today. She continues to be on oral antibiotics 06/30/17- she is here in follow up evaluation for right ischial and right lateral malleolus ulcer. She continues with negative pressure wound therapy to the ischial wound. She is currently on a diabetic therapy per JOYCELYN, LISKA. ([id number]) infectious disease, although she does state that she inconsistently takes it. She was advised to take her antibiotic as prescribed 07/11/2017 -- she has been unable to use a wound VAC regularly because she loses this evening and it has not been possible for her to use it. Electronic Signature(s) Signed: 07/11/2017 3:48:30 PM By: Christin Fudge MD, FACS Entered By: Christin Fudge on 07/11/2017 15:24:04 [place], [name] ([id number]) -------------------------------------------------------------------------------- Physical Exam Details Patient Name: YAVONNE, KISS S. Date of Service: 07/11/2017 2:00 PM Medical Record Number: [id number] Patient Account Number: [id number] Date of Birth/Sex: 20-Sep-1966 (51 y.o. Female) Treating RN: Cornell Barman Primary Care Provider: Delight Stare Other Clinician: Referring Provider: Delight Stare Treating Provider/Extender: Frann Rider in Treatment: 24 Constitutional . Pulse regular. Respirations normal and unlabored. Afebrile.  . Eyes Nonicteric. Reactive to light. Ears, Nose, Mouth, and Throat Lips, teeth, and gums WNL.Marland Kitchen Moist mucosa without lesions. Neck supple and nontender. No palpable supraclavicular or cervical adenopathy. Normal sized without goiter. Respiratory WNL. No retractions.. Cardiovascular Pedal Pulses WNL. No clubbing, cyanosis or edema. Lymphatic No adneopathy. No adenopathy. No adenopathy. Musculoskeletal Adexa without tenderness or enlargement.. Digits and nails w/o clubbing, cyanosis, infection, petechiae, ischemia, or inflammatory conditions.. Integumentary (Hair, Skin) No suspicious lesions. No crepitus or fluctuance. No peri-wound warmth or erythema. No masses.Marland Kitchen Psychiatric Judgement and insight Intact.. No evidence of depression, anxiety, or agitation.. Notes the right initial tuberosity wound has fair amount of depth but due to the folds in the gluteal area be unable to get a good seal and hence I believe we should suspend the wound VAC for a while and try local care. The right lateral malleolus wound was washed out with moist saline gauze and it has healthy granulation tissue. Electronic Signature(s) Signed: 07/11/2017 3:48:30 PM By: Christin Fudge MD, FACS Entered By: Christin Fudge on 07/11/2017 15:25:07 [place], [name] ([id number]) -------------------------------------------------------------------------------- Physician Orders Details Patient Name: CARLEAH, YABLONSKI. Date of Service: 07/11/2017 2:00 PM Medical Record Number: [id number] Patient Account Number: [id number] Date of Birth/Sex: [birth year] (51 y.o. Female) Treating RN: Cornell Barman Primary Care Provider: Delight Stare Other Clinician: Referring Provider: Delight Stare Treating Provider/Extender: Frann Rider in Treatment: 24  Verbal / Phone Orders: No Diagnosis Coding Wound Cleansing Wound #1 Right,Lateral Malleolus o Clean wound with Normal Saline. Wound #2 Right Gluteus o Clean wound with Normal  Saline. Anesthetic Wound #2 Right Gluteus o Topical Lidocaine 4% cream applied to wound bed prior to debridement Primary Wound Dressing Wound #1 Right,Lateral Malleolus o Aquacel Ag Wound #2 Right Gluteus o Aquacel Ag Secondary Dressing Wound #1 Right,Lateral Malleolus o Boardered Foam Dressing Wound #2 Right Gluteus o Boardered Foam Dressing Dressing Change Frequency Wound #1 Right,Lateral Malleolus o Change Dressing Monday, Wednesday, Friday Wound #2 Right Gluteus o Change Dressing Monday, Wednesday, Friday Follow-up Appointments Wound #1 Right,Lateral Malleolus o Return Appointment in 1 week. VASILIKI, SMALDONE ([id number]) Wound #2 Right Gluteus o Return Appointment in 1 week. Off-Loading Wound #1 Right,Lateral Malleolus o Turn and reposition every 2 hours Wound #2 Right Gluteus o Turn and reposition every 2 hours Home Health Wound #1 [place] Visits - Monday, Wednesday, Friday o Home Health Nurse may visit PRN to address patientos wound care needs. o FACE TO FACE ENCOUNTER: MEDICARE and MEDICAID PATIENTS: I certify that this patient is under my care and that I had a face-to-face encounter that meets the physician face-to-face encounter requirements with this patient on this date. The encounter with the patient was in whole or in part for the following MEDICAL CONDITION: (primary reason for [place]) MEDICAL NECESSITY: I certify, that based on my findings, NURSING services are a medically necessary home health service. HOME BOUND STATUS: I certify that my clinical findings support that this patient is homebound (i.e., Due to illness or injury, pt requires aid of supportive devices such as crutches, cane, wheelchairs, walkers, the use of special transportation or the assistance of another person to leave their place of residence. There is a normal inability to leave the home and doing so requires  considerable and taxing effort. Other absences are for medical reasons / religious services and are infrequent or of short duration when for other reasons). o If current dressing causes regression in wound condition, may D/C ordered dressing product/s and apply Normal Saline Moist Dressing daily until next [place] / Other MD appointment. [place] of regression in wound condition at [phone]. o Please direct any NON-WOUND related issues/requests for orders to patient's Primary Care Physician Wound #2 Right [place] Visits - Monday, Wednesday, Friday o Home Health Nurse may visit PRN to address patientos wound care needs. o FACE TO FACE ENCOUNTER: MEDICARE and MEDICAID PATIENTS: I certify that this patient is under my care and that I had a face-to-face encounter that meets the physician face-to-face encounter requirements with this patient on this date. The encounter with the patient was in whole or in part for the following MEDICAL CONDITION: (primary reason for [place]) MEDICAL NECESSITY: I certify, that based on my findings, NURSING services are a medically necessary home health service. HOME BOUND STATUS: I certify that my clinical findings support that this patient is homebound (i.e., Due to illness or injury, pt requires aid of supportive devices such as crutches, cane, wheelchairs, walkers, the use of special transportation or the assistance of another person to leave their place of residence. There is a normal inability to leave the home and doing so requires considerable and taxing effort. Other absences are for medical reasons / religious services and are infrequent or of short duration when for other reasons). LAISHA, RAU ([id number]) o If current dressing  causes regression in wound condition, may D/C ordered dressing product/s and apply Normal Saline Moist Dressing daily until next [place] / Other MD appointment. [place] of regression in wound condition at [phone]. o Please direct any NON-WOUND related issues/requests for orders to patient's Primary Care Physician Negative Pressure Wound Therapy Wound #2 Right Gluteus o Wound VAC settings at 125/130 mmHg continuous pressure. Use BLACK/GREEN foam to wound cavity. Use WHITE foam to fill any tunnel/s and/or undermining. Change VAC dressing 2 X WEEK. Change canister as indicated when full. Nurse may titrate settings and frequency of dressing changes as clinically indicated. - Collagen dressing under wound [place] Nurse may d/c VAC for s/s of increased infection, significant wound regression, or uncontrolled drainage. [place] at [phone]. o Apply contact layer over base of wound. o Number of foam/gauze pieces used in the dressing = o Place NPWT on HOLD. Electronic Signature(s) Signed: 07/11/2017 3:48:30 PM By: Christin Fudge MD, FACS Signed: 07/15/2017 7:55:48 AM By: Gretta Cool, [name], RN, CWS, [name], [name] Entered By: Gretta Cool, [name], RN, CWS, Kim on 07/11/2017 14:58:38 Kretsch, [name] ([id number]) -------------------------------------------------------------------------------- Problem List Details Patient Name: ROSELYNN, WHITACRE. Date of Service: 07/11/2017 2:00 PM Medical Record Number: [id number] Patient Account Number: [id number] Date of Birth/Sex: 06/01/1966 (51 y.o. Female) Treating RN: Cornell Barman Primary Care Provider: Delight Stare Other Clinician: Referring Provider: Delight Stare Treating Provider/Extender: Frann Rider in Treatment: 24 Active Problems ICD-10 Encounter Code Description Active Date Diagnosis E11.621 Type 2 diabetes mellitus with foot ulcer 01/23/2017 Yes L89.314 Pressure ulcer of right buttock, stage 4 01/23/2017 Yes L97.312 Non-pressure chronic ulcer of right ankle with fat layer 01/23/2017 Yes exposed F17.218 Nicotine dependence,  cigarettes, with other nicotine- 01/23/2017 Yes induced disorders I70.233 Atherosclerosis of native arteries of right leg with 01/23/2017 Yes ulceration of ankle M86.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, right ankle and foot 01/23/2017 Yes Inactive Problems Resolved Problems Electronic Signature(s) Signed: 07/11/2017 3:48:30 PM By: Christin Fudge MD, FACS Entered By: Christin Fudge on 07/11/2017 15:23:17 [place], Peace Chauncey Cruel ([id number]) -------------------------------------------------------------------------------- Progress Note Details Patient Name: [name] Date of Service: 07/11/2017 2:00 PM Medical Record Number: [id number] Patient Account Number: [id number] Date of Birth/Sex: 1966-07-07 (51 y.o. Female) Treating RN: Cornell Barman Primary Care Provider: Delight Stare Other Clinician: Referring Provider: Delight Stare Treating Provider/Extender: Frann Rider in Treatment: 24 Subjective Chief Complaint Information obtained from Patient She is here in follow up evaluation for right ischial and right lateral malleolus History of Present Illness (HPI) The following HPI elements were documented for the patient's wound: Location: right lateral ankle and right gluteal area Quality: Patient reports experiencing a sharp pain to affected area(s). Severity: Patient states wound are getting better slowly Duration: Patient has had the wound for > 4 months prior to seeking treatment at the wound center Timing: Pain in wound is constant (hurts all the time) Context: The wound occurred when the patient was in hospital with a necrotizing fasciitis of the right gluteal area and a ulceration on the right ankle Modifying Factors: Other treatment(s) tried include:admitted to the hospital for IV antibiotics and a full workup and has also had a recent angiogram Associated Signs and Symptoms: Patient reports having increase discharge. 51 year old patient was sent to [place] from [facility]  where she was seen by Dr. Sherril Cong for a left ankle ulceration and was recently hospitalized with hypotension and sepsis. She was treated with IV antibiotics and has been scheduled to see  Dr. Lajoyce Corners. She was seen by vascular surgery who recommended a femoral bypass but surgery has been delayed until her sacral wound from last year's necrotizing fasciitis has healed. Was seeing the wound care team at [facility] but wanted to change over. She is a smoker and smokes a pack of cigarettes a day The patient was recently admitted in [place] between February 2 and February 14. She had a follow- up to see vascular surgery, orthopedic surgery and infectious disease. 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 Review of the electronic medical records reveals that Dr. 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 An MRI showed edema in the bone which was consistent with osteomyelitis and the chronic ulcer and may need surgical intervention. She also had a sacral decubitus ulcer which was treated with the wound VAC. On 01/07/2017 she was taken up by the vascular surgeon Dr. Myra Gianotti for an abdominal aortogram and bilateral lower extremity runoff, for a history of having bilateral femoropopliteal bypass graft as well as external iliac stenting on the right and stenting of her bypass graft. She had developed a nonhealing wound on her right ankle and there was a possibility of a femoral occlusion. the findings were noted and the impression was a surgical revascularization with a aorto bifemoral  bypass graft. Both the femoropopliteal [place], Briyonna S. ([id number]) bypass grafts were patent. 01/31/2017 -- x-ray of the right hip and pelvis -- IMPRESSION:No radiographic evidence of acute osteomyelitis. Normal-appearing right hip joint space for age. No acute bony abnormality of the hip. 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 Since seeing her last week she has not had an appointment with infectious disease or the orthopedic specialist yet. 02/06/2017 -- the patient missed a couple of appointments yesterday due to the weather but other than that has apparently given up smoking for the last 5 days. 02/13/2017 -- she has rescheduled her infectious disease appointment and also the appointment with the orthopedic surgeon Dr. Lajoyce Corners 03/06/2017 -- she was seen by Dr. Zenaida Niece dam of infectious disease on 03/05/2017 and he recommendedIV antibiotics but the patient did not want to have that and he has given her Augmentin and doxycycline and will reevaluate her in 2 months time. 03/13/2017 -- he saw Dr. Myra Gianotti regarding her vascular issues and he would like to wait to the sacral wound is completely closed before considering aortobifemoral bypass graft. He will see her back in 2 months time. She was also seen by Dr. Lajoyce Corners of orthopedics who recommended continue wound care dressings and follow in the office in about 2 months time. he also recommended 3 view radiographs of the right ankle at follow-up. 03/28/2017 -- she did have a PICC line placed and Dr. Zenaida Niece dam has begun IV vancomycin and ceftriaxone. she is going to continue this until she sees him in approximately a month's time 04/18/2017 -- we had applied for a skin substitute for the patient's care but her copayment is going to be about $300 a piece and the patient will not be able to afford this. 05/08/17 on evaluation  today patient appears to potentially have a fungal rash in the  periwound region of the sacral area. This also seems to extend into the inguinal creases and factional region as well. She is tender to palpation with light rubbing over the region of the periwound and the skin in this region does have a beefy red appearance. Everything seems to point to a fungal infection at this time. She has been tolerating the dressing changes up to this point. There is no evidence of infection otherwise. 05/16/2017 -- was seen by Dr. 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 He did not think that she is making progress and was not able to examine her sacral decubitus ulcer as the patient would not allow it. She was switched to Bactrim DS 2 tablets twice a day since finishing her antibiotics. Her ESR was 76 and a CRP was 2.8. 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 05/23/17 on evaluation today patient's wound appears to be doing about the same in regard to the ankle as well as the gluteal area. She apparently has issues with the one back staying in place and she states the sill seems to break up the inferior aspect. She does have home help coming out to apply the wound VAC. She has been tolerating the dressing changes without any problem although she has had some increased pain in the gluteal region due to a fall she sustained and she feels like she brews that area and she had pain actually radiating down her leg which is slowly beginning to improve. There's no evidence of infection. 05/29/17 Unfortunately on evaluation today patient's wounds both in regard to the ankle as well as in regard to her gluteal region on the right appear to be measuring the same as last week's evaluation. She also tells me that she is having soreness today in regard to the right gluteal area because she had a  visitor who came to see her a couple days ago and stayed for six hours and she had to set up for that entire length of time visiting. This has called some increased discomfort. Normally she does not set up for those long periods of [place]. ([id number]) time. Fortunately there's no evidence of infection and no worsening of the wounds. 06/13/2017 -- the patient has had a lot of health issues including perfuse diarrhea and generalized weakness and I have instructed her to see her PCP as soon as possible and if things get worse to go to the ER. She has not brought her wound VAC today. She continues to be on oral antibiotics 06/30/17- she is here in follow up evaluation for right ischial and right lateral malleolus ulcer. She continues with negative pressure wound therapy to the ischial wound. She is currently on a diabetic therapy per infectious disease, although she does state that she inconsistently takes it. She was advised to take her antibiotic as prescribed 07/11/2017 -- she has been unable to use a wound VAC regularly because she loses this evening and it has not been possible for her to use it. Objective Constitutional Pulse regular. Respirations normal and unlabored. Afebrile. Vitals Time Taken: 2:38 PM, Height: 67 in, Weight: 137 lbs, BMI: 21.5, Temperature: 98.0 F, Pulse: 78 bpm, Respiratory Rate: 16 breaths/min. Eyes Nonicteric. Reactive to light. Ears, Nose, Mouth,  and Throat Lips, teeth, and gums WNL.Marland Kitchen Moist mucosa without lesions. Neck supple and nontender. No palpable supraclavicular or cervical adenopathy. Normal sized without goiter. Respiratory WNL. No retractions.. Cardiovascular Pedal Pulses WNL. No clubbing, cyanosis or edema. Lymphatic No adneopathy. No adenopathy. No adenopathy. Musculoskeletal Millikin, Lakesia S. ([id number]) Adexa without tenderness or enlargement.. Digits and nails w/o clubbing, cyanosis, infection, petechiae, ischemia, or inflammatory  conditions.Marland Kitchen Psychiatric Judgement and insight Intact.. No evidence of depression, anxiety, or agitation.. General Notes: the right initial tuberosity wound has fair amount of depth but due to the folds in the gluteal area be unable to get a good seal and hence I believe we should suspend the wound VAC for a while and try local care. The right lateral malleolus wound was washed out with moist saline gauze and it has healthy granulation tissue. Integumentary (Hair, Skin) No suspicious lesions. No crepitus or fluctuance. No peri-wound warmth or erythema. No masses.. Wound #1 status is Open. Original cause of wound was Gradually Appeared. The wound is located on the Right,Lateral Malleolus. The wound measures 1.1cm length x 1.5cm width x 0.4cm depth; 1.296cm^2 area and 0.518cm^3 volume. There is Fat Layer (Subcutaneous Tissue) Exposed exposed. There is no tunneling noted, however, there is undermining starting at 10:00 and ending at 5:00 with a maximum distance of 0.8cm. There is a large amount of serosanguineous drainage noted. The wound margin is distinct with the outline attached to the wound base. There is large (67-100%) pink granulation within the wound bed. There is a small (1-33%) amount of necrotic tissue within the wound bed including Adherent Slough. The periwound skin appearance exhibited: Induration, Maceration, Erythema. The periwound skin appearance did not exhibit: Callus, Crepitus, Excoriation, Rash, Scarring, Dry/Scaly, Atrophie Blanche, Cyanosis, Ecchymosis, Hemosiderin Staining, Mottled, Pallor, Rubor. The surrounding wound skin color is noted with erythema which is circumferential. Periwound temperature was noted as No Abnormality. The periwound has tenderness on palpation. Wound #2 status is Open. Original cause of wound was Gradually Appeared. The wound is located on the Right Gluteus. The wound measures 2.5cm length x 1.3cm width x 1.2cm depth; 2.553cm^2 area and 3.063cm^3  volume. There is Fat Layer (Subcutaneous Tissue) Exposed exposed. There is no tunneling or undermining noted. There is a large amount of serous drainage noted. The wound margin is distinct with the outline attached to the wound base. There is large (67-100%) red, pink granulation within the wound bed. There is a small (1-33%) amount of necrotic tissue within the wound bed including Adherent Slough. The periwound skin appearance exhibited: Maceration, Ecchymosis. Periwound temperature was noted as No Abnormality. The periwound has tenderness on palpation. 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 ([id number]) M86.371 - Chronic multifocal osteomyelitis, right ankle and foot Plan Wound Cleansing: Wound #1 Right,Lateral Malleolus: Clean wound with Normal Saline. Wound #2 Right Gluteus: Clean wound with Normal Saline. Anesthetic: Wound #2 Right Gluteus: Topical Lidocaine 4% cream applied to wound bed prior to debridement Primary Wound Dressing: Wound #1 Right,Lateral Malleolus: Aquacel Ag Wound #2 Right Gluteus: Aquacel Ag Secondary Dressing: Wound #1 Right,Lateral Malleolus: Boardered Foam Dressing Wound #2 Right Gluteus: Boardered Foam Dressing Dressing Change Frequency: Wound #1 Right,Lateral Malleolus: Change Dressing Monday, Wednesday, Friday Wound #  2 Right Gluteus: Change Dressing Monday, Wednesday, Friday Follow-up Appointments: Wound #1 Right,Lateral Malleolus: Return Appointment in 1 week. Wound #2 Right Gluteus: Return Appointment in 1 week. Off-Loading: Wound #1 Right,Lateral Malleolus: Turn and reposition every 2  hours Wound #2 Right Gluteus: Turn and reposition every 2 hours Home Health: Wound #1 Right,Lateral Malleolus: Continue Home Health Visits - Monday, Wednesday, Friday Home Health Nurse may visit PRN to address patient s wound care needs. FACE TO FACE ENCOUNTER: MEDICARE and MEDICAID PATIENTS: I certify that this patient is under my care and that I had a face-to-face encounter that meets the physician face-to-face encounter requirements with this patient on this date. The encounter with the patient was in whole or in part for the following MEDICAL CONDITION: (primary reason for Home Healthcare) MEDICAL NECESSITY: I certify, KAREENA, ARRAMBIDE ([id number]) that based on my findings, NURSING services are a medically necessary home health service. HOME BOUND STATUS: I certify that my clinical findings support that this patient is homebound (i.e., Due to illness or injury, pt requires aid of supportive devices such as crutches, cane, wheelchairs, walkers, the use of special transportation or the assistance of another person to leave their place of residence. There is a normal inability to leave the home and doing so requires considerable and taxing effort. Other absences are for medical reasons / religious services and are infrequent or of short duration when for other reasons). If current dressing causes regression in wound condition, may D/C ordered dressing product/s and apply Normal Saline Moist Dressing daily until next Wound Healing Center / Other MD appointment. Notify Wound Healing Center of regression in wound condition at [phone]. Please direct any NON-WOUND related issues/requests for orders to patient's Primary Care Physician Wound #2 Right Gluteus: Continue Home Health Visits - Monday, Wednesday, Friday Home Health Nurse may visit PRN to address patient s wound care needs. FACE TO FACE ENCOUNTER: MEDICARE and MEDICAID PATIENTS: I certify that this patient is under my care and that  I had a face-to-face encounter that meets the physician face-to-face encounter requirements with this patient on this date. The encounter with the patient was in whole or in part for the following MEDICAL CONDITION: (primary reason for Home Healthcare) MEDICAL NECESSITY: I certify, that based on my findings, NURSING services are a medically necessary home health service. HOME BOUND STATUS: I certify that my clinical findings support that this patient is homebound (i.e., Due to illness or injury, pt requires aid of supportive devices such as crutches, cane, wheelchairs, walkers, the use of special transportation or the assistance of another person to leave their place of residence. There is a normal inability to leave the home and doing so requires considerable and taxing effort. Other absences are for medical reasons / religious services and are infrequent or of short duration when for other reasons). If current dressing causes regression in wound condition, may D/C ordered dressing product/s and apply Normal Saline Moist Dressing daily until next Wound Healing Center / Other MD appointment. Notify Wound Healing Center of regression in wound condition at [phone]. Please direct any NON-WOUND related issues/requests for orders to patient's Primary Care Physician Negative Pressure Wound Therapy: Wound #2 Right Gluteus: Wound VAC settings at 125/130 mmHg continuous pressure. Use BLACK/GREEN foam to wound cavity. Use WHITE foam to fill any tunnel/s and/or undermining. Change VAC dressing 2 X WEEK. Change canister as indicated when full. Nurse may titrate settings and frequency of dressing changes as clinically indicated. - Collagen dressing under wound vac  Home Health Nurse may d/c VAC for s/s of increased infection, significant wound regression, or uncontrolled drainage. [place] at [phone]. Apply contact layer over base of wound. Number of foam/gauze pieces used in the  dressing = Place NPWT on HOLD. after review today I have recommended: 1. Silver alginate to be applied to the right ankle, changed every other day 2. Silver alginate with a gauze bolster to the right gluteal region to be changed 3 times a week. 3. adequate protein, vitamin A, vitamin C and zinc NITHYA, MERIWEATHER ([id number]) Electronic Signature(s) Signed: 07/11/2017 3:48:30 PM By: Christin Fudge MD, FACS Entered By: Christin Fudge on 07/11/2017 15:26:26 Tangonan, Adan Chauncey Cruel ([id number]) -------------------------------------------------------------------------------- SuperBill Details Patient Name: [name] Date of Service: 07/11/2017 Medical Record Number: [id number] Patient Account Number: [id number] Date of Birth/Sex: September 14, 1966 (51 y.o. 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 M86.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, right ankle and foot Facility Procedures CPT4 Code: [id number] Description: 99213 - WOUND CARE VISIT-LEV 3 EST PT Modifier: Quantity: 1 Physician Procedures CPT4 Code Description: [id number] 99213 - WC PHYS LEVEL 3 - EST PT ICD-10 Description Diagnosis E11.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

## 2017-07-18 ENCOUNTER — Encounter: Payer: Medicare Other | Admitting: Surgery

## 2017-07-18 DIAGNOSIS — E11622 Type 2 diabetes mellitus with other skin ulcer: Secondary | ICD-10-CM | POA: Diagnosis not present

## 2017-07-20 NOTE — Progress Notes (Signed)
QAMAR, ROSMAN ([id number]) Visit Report for 07/18/2017 Chief Complaint Document Details Patient Name: [name], [name]. Date of Service: 07/18/2017 10:15 AM Medical Record Number: [id number] Patient Account Number: [id number] Date of Birth/Sex: Dec 04, 1965 (51 y.o. Female) Treating RN: Montey Hora Primary Care Provider: Delight Stare Other Clinician: Referring Provider: Delight Stare Treating Provider/Extender: Frann Rider in Treatment: 25 Information Obtained from: Patient Chief Complaint She is here in follow up evaluation for right ischial and right lateral malleolus Electronic Signature(s) Signed: 07/18/2017 3:31:36 PM By: Christin Fudge MD, FACS Entered By: Christin Fudge on 07/18/2017 11:25:08 [name], [name] ([id number]) -------------------------------------------------------------------------------- HPI Details Patient Name: [name], [name] Date of Service: 07/18/2017 10:15 AM Medical Record Number: [id number] Patient Account Number: [id number] Date of Birth/Sex: Jan 14, 1966 (51 y.o. Female) Treating RN: Montey Hora Primary Care Provider: Delight Stare Other Clinician: Referring Provider: Delight Stare Treating Provider/Extender: Frann Rider in Treatment: 25 History of Present Illness Location: right lateral ankle and right gluteal area Quality: Patient reports experiencing a sharp pain to affected area(s). Severity: Patient states wound are getting better slowly Duration: Patient has had the wound for > 4 months prior to seeking treatment at the wound center Timing: Pain in wound is constant (hurts all the time) Context: The wound occurred when the patient was in hospital with a necrotizing fasciitis of the right gluteal area and a ulceration on the right ankle Modifying Factors: Other treatment(s) tried include:admitted to the hospital for IV antibiotics and a full workup and has also had a recent angiogram Associated Signs and Symptoms: Patient reports  having increase discharge. HPI Description: 51 year old patient was sent to [place] from [facility] where she was seen by Dr. Sherril Cong for a left ankle ulceration and was recently hospitalized with hypotension and sepsis. She was treated with IV antibiotics and has been scheduled to see Dr. Sharol Given. She was seen by vascular surgery who recommended a femoral bypass but surgery has been delayed until her sacral wound from last year's necrotizing fasciitis has healed. Was seeing the wound care team at [facility] but wanted to change over. She is a smoker and smokes a pack of cigarettes a day The patient was recently admitted in [place] between February 2 and February 14. She had a follow- up to see vascular surgery, orthopedic surgery and infectious disease. 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 Review of the electronic medical records reveals that Dr. 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 An MRI showed edema in the bone which was consistent with osteomyelitis and the chronic ulcer and may need surgical intervention. She also had a sacral decubitus ulcer which was treated with the wound VAC. On 01/07/2017 she was taken up by the vascular surgeon Dr. Trula Slade for an abdominal aortogram and bilateral lower extremity runoff, for a history of having bilateral femoropopliteal bypass graft as well as external iliac stenting on the right and stenting of her bypass graft. She had developed a nonhealing wound on her right ankle and there was a possibility of a  femoral occlusion. the findings were noted and the impression was a surgical revascularization with a aorto  bifemoral bypass graft. Both the femoropopliteal bypass grafts were patent. 01/31/2017 -- x-ray of the right hip and pelvis -- IMPRESSION:No radiographic evidence of acute osteomyelitis. Normal-appearing right hip joint space for age. No acute bony abnormality of the hip. 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 Since seeing her last week she has not had an appointment with infectious disease or the orthopedic specialist yet. [name], [name] ([id number]) 02/06/2017 -- the patient missed a couple of appointments yesterday due to the weather but other than that has apparently given up smoking for the last 5 days. 02/13/2017 -- she has rescheduled her infectious disease appointment and also the appointment with the orthopedic surgeon Dr. Sharol Given 03/06/2017 -- she was seen by Dr. Lucianne Lei dam of infectious disease on 03/05/2017 and he recommendedIV antibiotics but the patient did not want to have that and he has given her Augmentin and doxycycline and will reevaluate her in 2 months time. 03/13/2017 -- he saw Dr. Trula Slade regarding her vascular issues and he would like to wait to the sacral wound is completely closed before considering aortobifemoral bypass graft. He will see her back in 2 months time. She was also seen by Dr. Sharol Given of orthopedics who recommended continue wound care dressings and follow in the office in about 2 months time. he also recommended 3 view radiographs of the right ankle at follow-up. 03/28/2017 -- she did have a PICC line placed and Dr. Lucianne Lei dam has begun IV vancomycin and ceftriaxone. she is going to continue this until she sees him in approximately a month's time 04/18/2017 -- we had applied for a skin substitute for the patient's care but her copayment is going to be about $300 a piece and the patient will not  be able to afford this. 05/08/17 on evaluation today patient appears to potentially have a fungal rash in the periwound region of the sacral area. This also seems to extend into the inguinal creases and factional region as well. She is tender to palpation with light rubbing over the region of the periwound and the skin in this region does have a beefy red appearance. Everything seems to point to a fungal infection at this time. She has been tolerating the dressing changes up to this point. There is no evidence of infection otherwise. 05/16/2017 -- was seen by Dr. 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 He did not think that she is making progress and was not able to examine her sacral decubitus ulcer as the patient would not allow it. She was switched to Bactrim DS 2 tablets twice a day since finishing her antibiotics. Her ESR was 76 and a CRP was 2.8. 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 05/23/17 on evaluation today patient's wound appears to be doing about the same in regard to the ankle as well as the gluteal area. She apparently has issues with the one back staying in place and she states the sill seems to break up the inferior aspect. She does have home help coming out to apply the wound VAC. She has been tolerating the dressing changes without any problem although she has had some increased pain in the gluteal region due to a fall she sustained and she feels like she brews that area  and she had pain actually radiating down her leg which is slowly beginning to improve. There's no evidence of infection. 05/29/17 Unfortunately on evaluation today patient's wounds both in regard to the ankle as well as in regard to her gluteal region on the right appear to be measuring the same as last week's evaluation.  She also tells me that she is having soreness today in regard to the right gluteal area because she had a visitor who came to see her a couple days ago and stayed for six hours and she had to set up for that entire length of time visiting. This has called some increased discomfort. Normally she does not set up for those long periods of time. Fortunately there's no evidence of infection and no worsening of the wounds. 06/13/2017 -- the patient has had a lot of health issues including perfuse diarrhea and generalized weakness and I have instructed her to see her PCP as soon as possible and if things get worse to go to the ER. She has not brought her wound VAC today. She continues to be on oral antibiotics 06/30/17- she is here in follow up evaluation for right ischial and right lateral malleolus ulcer. She continues with negative pressure wound therapy to the ischial wound. She is currently on a diabetic therapy per [place], [place]FF[place]. ([id number]) infectious disease, although she does state that she inconsistently takes it. She was advised to take her antibiotic as prescribed 07/11/2017 -- she has been unable to use a wound VAC regularly because she loses this evening and it has not been possible for her to use it. 07/18/2017 -- she is much more comfortable and looks better without the wound VAC and at this stage I believe it will be better for her to return the machine and continue with local care. Electronic Signature(s) Signed: 07/18/2017 3:31:36 PM By: Christin Fudge MD, FACS Entered By: Christin Fudge on 07/18/2017 11:25:38 [name], [name] ([id number]) -------------------------------------------------------------------------------- Physical Exam Details Patient Name: KHADIJA, THIER S. Date of Service: 07/18/2017 10:15 AM Medical Record Number: [id number] Patient Account Number: [id number] Date of Birth/Sex: 07-May-1966 (51 y.o. Female) Treating RN: Montey Hora Primary Care Provider: Delight Stare Other Clinician: Referring Provider: Delight Stare Treating Provider/Extender: Frann Rider in Treatment: 25 Constitutional . Pulse regular. Respirations normal and unlabored. Afebrile. . Eyes Nonicteric. Reactive to light. Ears, Nose, Mouth, and Throat Lips, teeth, and gums WNL.Marland Kitchen Moist mucosa without lesions. Neck supple and nontender. No palpable supraclavicular or cervical adenopathy. Normal sized without goiter. Respiratory WNL. No retractions.. Cardiovascular Pedal Pulses WNL. No clubbing, cyanosis or edema. Lymphatic No adneopathy. No adenopathy. No adenopathy. Musculoskeletal Adexa without tenderness or enlargement.. Digits and nails w/o clubbing, cyanosis, infection, petechiae, ischemia, or inflammatory conditions.. Integumentary (Hair, Skin) No suspicious lesions. No crepitus or fluctuance. No peri-wound warmth or erythema. No masses.Marland Kitchen Psychiatric Judgement and insight Intact.. No evidence of depression, anxiety, or agitation.. Notes the right shift tuberosity is less macerated and the wound looks clean and there is no evidence of any probing down to bone. The right lateral malleolus wound has a layer of debris which easily washes off with moist saline gauze. No sharp debridement was required today. Electronic Signature(s) Signed: 07/18/2017 3:31:36 PM By: Christin Fudge MD, FACS Entered By: Christin Fudge on 07/18/2017 11:26:17 Gonsalves, [name] ([id number]) -------------------------------------------------------------------------------- Physician Orders Details Patient Name: [name], [name]. Date of Service: 07/18/2017 10:15 AM Medical Record Number: [id number] Patient Account Number: [id number] Date of Birth/Sex: Feb 21, 1966 (51 y.o. Female) Treating RN:  Montey Hora Primary Care Provider: Delight Stare Other Clinician: Referring Provider: Delight Stare Treating Provider/Extender: Frann Rider in Treatment: 42 Verbal / Phone Orders: No Diagnosis  Coding Wound Cleansing Wound #1 Right,Lateral Malleolus o Clean wound with Normal Saline. Wound #2 Right Gluteus o Clean wound with Normal Saline. Anesthetic Wound #2 Right Gluteus o Topical Lidocaine 4% cream applied to wound bed prior to debridement Primary Wound Dressing Wound #1 Right,Lateral Malleolus o Aquacel Ag Wound #2 Right Gluteus o Aquacel Ag Secondary Dressing Wound #1 Right,Lateral Malleolus o Boardered Foam Dressing Wound #2 Right Gluteus o Boardered Foam Dressing Dressing Change Frequency Wound #1 Right,Lateral Malleolus o Change Dressing Monday, Wednesday, Friday Wound #2 Right Gluteus o Change Dressing Monday, Wednesday, Friday Follow-up Appointments Wound #1 Right,Lateral Malleolus o Return Appointment in: - 3 weeks [name], [name] ([id number]) Wound #2 Right Gluteus o Return Appointment in: - 3 weeks Off-Loading Wound #1 Right,Lateral Malleolus o Turn and reposition every 2 hours Wound #2 Right Gluteus o Turn and reposition every 2 hours [place] #1 [place] Visits - Monday, Wednesday, Friday o Home Health Nurse may visit PRN to address patientos wound care needs. o FACE TO FACE ENCOUNTER: MEDICARE and MEDICAID PATIENTS: I certify that this patient is under my care and that I had a face-to-face encounter that meets the physician face-to-face encounter requirements with this patient on this date. The encounter with the patient was in whole or in part for the following MEDICAL CONDITION: (primary reason for [place]) MEDICAL NECESSITY: I certify, that based on my findings, NURSING services are a medically necessary home health service. HOME BOUND STATUS: I certify that my clinical findings support that this patient is homebound (i.e., Due to illness or injury, pt requires aid of supportive devices such as crutches, cane, wheelchairs, walkers, the use of  special transportation or the assistance of another person to leave their place of residence. There is a normal inability to leave the home and doing so requires considerable and taxing effort. Other absences are for medical reasons / religious services and are infrequent or of short duration when for other reasons). o If current dressing causes regression in wound condition, may D/C ordered dressing product/s and apply Normal Saline Moist Dressing daily until next [place] / Other MD appointment. [place] of regression in wound condition at [phone]. o Please direct any NON-WOUND related issues/requests for orders to patient's Primary Care Physician Wound #2 Right [place] Visits - Monday, Wednesday, Friday o Home Health Nurse may visit PRN to address patientos wound care needs. o FACE TO FACE ENCOUNTER: MEDICARE and MEDICAID PATIENTS: I certify that this patient is under my care and that I had a face-to-face encounter that meets the physician face-to-face encounter requirements with this patient on this date. The encounter with the patient was in whole or in part for the following MEDICAL CONDITION: (primary reason for [place]) MEDICAL NECESSITY: I certify, that based on my findings, NURSING services are a medically necessary home health service. HOME BOUND STATUS: I certify that my clinical findings support that this patient is homebound (i.e., Due to illness or injury, pt requires aid of supportive devices such as crutches, cane, wheelchairs, walkers, the use of special transportation or the assistance of another person to leave their place of residence. There is a normal inability to leave the home and doing so requires considerable and taxing effort. Other absences are for medical  reasons / religious services and are infrequent or of short duration when for other reasons). [name], [name] ([id number]) o If  current dressing causes regression in wound condition, may D/C ordered dressing product/s and apply Normal Saline Moist Dressing daily until next [place] / Other MD appointment. [place] of regression in wound condition at [phone]. o Please direct any NON-WOUND related issues/requests for orders to patient's Primary Care Physician Negative Pressure Wound Therapy Wound #2 Right Gluteus o Discontinue NPWT. Electronic Signature(s) Signed: 07/18/2017 3:31:36 PM By: Christin Fudge MD, FACS Signed: 07/18/2017 4:32:48 PM By: Montey Hora Entered By: Montey Hora on 07/18/2017 10:56:11 Kopp, [name] ([id number]) -------------------------------------------------------------------------------- Problem List Details Patient Name: [name], [name]. Date of Service: 07/18/2017 10:15 AM Medical Record Number: [id number] Patient Account Number: [id number] Date of Birth/Sex: December 14, 1965 (51 y.o. Female) Treating RN: Montey Hora Primary Care Provider: Delight Stare Other Clinician: Referring Provider: Delight Stare Treating Provider/Extender: Frann Rider in Treatment: 25 Active Problems ICD-10 Encounter Code Description Active Date Diagnosis E11.621 Type 2 diabetes mellitus with foot ulcer 01/23/2017 Yes L89.314 Pressure ulcer of right buttock, stage 4 01/23/2017 Yes L97.312 Non-pressure chronic ulcer of right ankle with fat layer 01/23/2017 Yes exposed F17.218 Nicotine dependence, cigarettes, with other nicotine- 01/23/2017 Yes induced disorders I70.233 Atherosclerosis of native arteries of right leg with 01/23/2017 Yes ulceration of ankle M86.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  ([id number]) -------------------------------------------------------------------------------- Progress Note Details Patient Name: [name] Date of Service: 07/18/2017 10:15 AM Medical Record Number: [id number] Patient Account Number: [id number] Date of Birth/Sex: December 23, 1965 (51 y.o. Female) Treating RN: Montey Hora Primary Care Provider: Delight Stare Other Clinician: Referring Provider: Delight Stare Treating Provider/Extender: Frann Rider in Treatment: 25 Subjective Chief Complaint Information obtained from Patient She is here in follow up evaluation for right ischial and right lateral malleolus History of Present Illness (HPI) The following HPI elements were documented for the patient's wound: Location: right lateral ankle and right gluteal area Quality: Patient reports experiencing a sharp pain to affected area(s). Severity: Patient states wound are getting better slowly Duration: Patient has had the wound for > 4 months prior to seeking treatment at the wound center Timing: Pain in wound is constant (hurts all the time) Context: The wound occurred when the patient was in hospital with a necrotizing fasciitis of the right gluteal area and a ulceration on the right ankle Modifying Factors: Other treatment(s) tried include:admitted to the hospital for IV antibiotics and a full workup and has also had a recent angiogram Associated Signs and Symptoms: Patient reports having increase discharge. 51 year old patient was sent to [place] from [facility] where she was seen by Dr. Sherril Cong for a left ankle ulceration and was recently hospitalized with hypotension and sepsis. She was treated with IV antibiotics and has been scheduled to see Dr. Sharol Given. She was seen by vascular surgery who recommended a femoral bypass but surgery has been delayed until her sacral wound from last year's necrotizing fasciitis has healed. Was seeing the wound care team at [facility] but wanted to  change over. She is a smoker and smokes a pack of cigarettes a day The patient was recently admitted in [place] between February 2 and February 14. She had a follow- up to see vascular surgery, orthopedic surgery and infectious disease. During her  admission she was known to have peripheral arterial disease, diabetes mellitus with neuropathy, chronic pain, open wound with necrotizing fasciitis of the sacral area which had been there since November 2017 Past medical history significant for diabetes mellitus, ankle ulcer, sacral ulcer, necrotizing fasciitis, arterial occlusive disease, tobacco abuse. Review of the electronic medical records reveals that Dr. 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 An MRI showed edema in the bone which was consistent with osteomyelitis and the chronic ulcer and may need surgical intervention. She also had a sacral decubitus ulcer which was treated with the wound VAC. On 01/07/2017 she was taken up by the vascular surgeon Dr. Trula Slade for an abdominal aortogram and bilateral lower extremity runoff, for a history of having bilateral femoropopliteal bypass graft as well as external iliac stenting on the right and stenting of her bypass graft. She had developed a nonhealing wound on her right ankle and there was a possibility of a femoral occlusion. the findings were noted and the impression was a surgical revascularization with a aorto bifemoral bypass graft. Both the femoropopliteal [name], [place]. ([id number]) bypass grafts were patent. 01/31/2017 -- x-ray of the right hip and pelvis -- IMPRESSION:No radiographic evidence of acute osteomyelitis. Normal-appearing right hip joint space for age. No acute bony abnormality of the hip. 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 Since seeing her last  week she has not had an appointment with infectious disease or the orthopedic specialist yet. 02/06/2017 -- the patient missed a couple of appointments yesterday due to the weather but other than that has apparently given up smoking for the last 5 days. 02/13/2017 -- she has rescheduled her infectious disease appointment and also the appointment with the orthopedic surgeon Dr. Sharol Given 03/06/2017 -- she was seen by Dr. Lucianne Lei dam of infectious disease on 03/05/2017 and he recommendedIV antibiotics but the patient did not want to have that and he has given her Augmentin and doxycycline and will reevaluate her in 2 months time. 03/13/2017 -- he saw Dr. Trula Slade regarding her vascular issues and he would like to wait to the sacral wound is completely closed before considering aortobifemoral bypass graft. He will see her back in 2 months time. She was also seen by Dr. Sharol Given of orthopedics who recommended continue wound care dressings and follow in the office in about 2 months time. he also recommended 3 view radiographs of the right ankle at follow-up. 03/28/2017 -- she did have a PICC line placed and Dr. Lucianne Lei dam has begun IV vancomycin and ceftriaxone. she is going to continue this until she sees him in approximately a month's time 04/18/2017 -- we had applied for a skin substitute for the patient's care but her copayment is going to be about $300 a piece and the patient will not be able to afford this. 05/08/17 on evaluation today patient appears to potentially have a fungal rash in the periwound region of the sacral area. This also seems to extend into the inguinal creases and factional region as well. She is tender to palpation with light rubbing over the region of the periwound and the skin in this region does have a beefy red appearance. Everything seems to point to a fungal infection at this time. She has been tolerating the dressing changes up to this point. There is no evidence  of infection  otherwise. 05/16/2017 -- was seen by Dr. 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 He did not think that she is making progress and was not able to examine her sacral decubitus ulcer as the patient would not allow it. She was switched to Bactrim DS 2 tablets twice a day since finishing her antibiotics. Her ESR was 76 and a CRP was 2.8. 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 05/23/17 on evaluation today patient's wound appears to be doing about the same in regard to the ankle as well as the gluteal area. She apparently has issues with the one back staying in place and she states the sill seems to break up the inferior aspect. She does have home help coming out to apply the wound VAC. She has been tolerating the dressing changes without any problem although she has had some increased pain in the gluteal region due to a fall she sustained and she feels like she brews that area and she had pain actually radiating down her leg which is slowly beginning to improve. There's no evidence of infection. 05/29/17 Unfortunately on evaluation today patient's wounds both in regard to the ankle as well as in regard to her gluteal region on the right appear to be measuring the same as last week's evaluation. She also tells me that she is having soreness today in regard to the right gluteal area because she had a visitor who came to see her a couple days ago and stayed for six hours and she had to set up for that entire length of time visiting. This has called some increased discomfort. Normally she does not set up for those long periods of [place], [place]. ([id number]) time. Fortunately there's no evidence of infection and no worsening of the wounds. 06/13/2017 -- the patient has had a lot of health issues including perfuse  diarrhea and generalized weakness and I have instructed her to see her PCP as soon as possible and if things get worse to go to the ER. She has not brought her wound VAC today. She continues to be on oral antibiotics 06/30/17- she is here in follow up evaluation for right ischial and right lateral malleolus ulcer. She continues with negative pressure wound therapy to the ischial wound. She is currently on a diabetic therapy per infectious disease, although she does state that she inconsistently takes it. She was advised to take her antibiotic as prescribed 07/11/2017 -- she has been unable to use a wound VAC regularly because she loses this evening and it has not been possible for her to use it. 07/18/2017 -- she is much more comfortable and looks better without the wound VAC and at this stage I believe it will be better for her to return the machine and continue with local care. Objective Constitutional Pulse regular. Respirations normal and unlabored. Afebrile. Vitals Time Taken: 10:29 AM, Height: 67 in, Weight: 137 lbs, BMI: 21.5, Temperature: 97.7 F, Pulse: 89 bpm, Respiratory Rate: 18 breaths/min. Eyes Nonicteric. Reactive to light. Ears, Nose, Mouth, and Throat Lips, teeth, and gums WNL.Marland Kitchen Moist mucosa without lesions. Neck supple and nontender. No palpable supraclavicular or cervical adenopathy. Normal sized without goiter. Respiratory WNL. No retractions.. Cardiovascular Pedal Pulses WNL. No clubbing, cyanosis or edema. Lymphatic [name], [name] ([id number]) No adneopathy. No adenopathy. No  adenopathy. Musculoskeletal Adexa without tenderness or enlargement.. Digits and nails w/o clubbing, cyanosis, infection, petechiae, ischemia, or inflammatory conditions.Marland Kitchen Psychiatric Judgement and insight Intact.. No evidence of depression, anxiety, or agitation.. General Notes: the right shift tuberosity is less macerated and the wound looks clean and there is no evidence of any  probing down to bone. The right lateral malleolus wound has a layer of debris which easily washes off with moist saline gauze. No sharp debridement was required today. Integumentary (Hair, Skin) No suspicious lesions. No crepitus or fluctuance. No peri-wound warmth or erythema. No masses.. Wound #1 status is Open. Original cause of wound was Gradually Appeared. The wound is located on the Right,Lateral Malleolus. The wound measures 1cm length x 1.3cm width x 0.3cm depth; 1.021cm^2 area and 0.306cm^3 volume. There is Fat Layer (Subcutaneous Tissue) Exposed exposed. There is no tunneling or undermining noted. There is a large amount of serosanguineous drainage noted. The wound margin is distinct with the outline attached to the wound base. There is large (67-100%) pink granulation within the wound bed. There is a small (1-33%) amount of necrotic tissue within the wound bed including Adherent Slough. The periwound skin appearance exhibited: Induration, Maceration, Erythema. The periwound skin appearance did not exhibit: Callus, Crepitus, Excoriation, Rash, Scarring, Dry/Scaly, Atrophie Blanche, Cyanosis, Ecchymosis, Hemosiderin Staining, Mottled, Pallor, Rubor. The surrounding wound skin color is noted with erythema which is circumferential. Periwound temperature was noted as No Abnormality. The periwound has tenderness on palpation. Wound #2 status is Open. Original cause of wound was Gradually Appeared. The wound is located on the Right Gluteus. The wound measures 2.3cm length x 1.3cm width x 1.2cm depth; 2.348cm^2 area and 2.818cm^3 volume. There is Fat Layer (Subcutaneous Tissue) Exposed exposed. There is no tunneling or undermining noted. There is a large amount of serous drainage noted. The wound margin is distinct with the outline attached to the wound base. There is large (67-100%) red, pink granulation within the wound bed. There is a small (1-33%) amount of necrotic tissue within the wound  bed including Adherent Slough. The periwound skin appearance exhibited: Maceration, Ecchymosis. Periwound temperature was noted as No Abnormality. The periwound has tenderness on palpation. 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 Wound #2 Right Gluteus: Clean wound with Normal Saline. Anesthetic: Wound #2 Right Gluteus: Topical Lidocaine 4% cream applied to wound bed prior to debridement Primary Wound Dressing: Wound #1 Right,Lateral Malleolus: Aquacel Ag Wound #2 Right Gluteus: Aquacel Ag Secondary Dressing: Wound #1 Right,Lateral Malleolus: Boardered Foam Dressing Wound #2 Right Gluteus: Boardered Foam Dressing Dressing Change Frequency: Wound #1 Right,Lateral Malleolus: Change Dressing Monday, Wednesday, Friday Wound #2 Right Gluteus: Change Dressing Monday, Wednesday, Friday Follow-up Appointments: Wound #1 Right,Lateral Malleolus: Return Appointment in: - 3 weeks Wound #2 Right Gluteus: Return Appointment in: - 3 weeks Off-Loading: Wound #1 Right,Lateral Malleolus: Turn and reposition every 2 hours Wound #2 Right Gluteus: Turn and reposition every 2 hours Home Health: Wound #1 Right,Lateral Malleolus: [place] Visits - Monday, Wednesday, Friday [place] Nurse may visit PRN to address patient s wound care  needs. FACE TO FACE ENCOUNTER: MEDICARE and MEDICAID PATIENTS: I certify that this patient is under my care and that I had a  face-to-face encounter that meets the physician face-to-face encounter requirements with this patient on this date. The encounter with the patient was in whole or in part for the ESTIE, SPROULE. ([id number]) following MEDICAL CONDITION: (primary reason for [place]) MEDICAL NECESSITY: I certify, that based on my findings, NURSING services are a medically necessary home health service. HOME BOUND STATUS: I certify that my clinical findings support that this patient is homebound (i.e., Due to illness or injury, pt requires aid of supportive devices such as crutches, cane, wheelchairs, walkers, the use of special transportation or the assistance of another person to leave their place of residence. There is a normal inability to leave the home and doing so requires considerable and taxing effort. Other absences are for medical reasons / religious services and are infrequent or of short duration when for other reasons). If current dressing causes regression in wound condition, may D/C ordered dressing product/s and apply Normal Saline Moist Dressing daily until next [place] / Other MD appointment. [place] of regression in wound condition at [phone]. Please direct any NON-WOUND related issues/requests for orders to patient's Primary Care Physician Wound #2 Right Gluteus: [place] Visits - Monday, Wednesday, Friday Home Health Nurse may visit PRN to address patient s wound care needs. FACE TO FACE ENCOUNTER: MEDICARE and MEDICAID PATIENTS: I certify that this patient is under my care and that I had a face-to-face encounter that meets the physician face-to-face encounter requirements with this patient on this date. The encounter with the patient was in whole or in part for the following MEDICAL CONDITION: (primary reason for [place]) MEDICAL NECESSITY: I certify, that based on my findings, NURSING services are a medically necessary home  health service. HOME BOUND STATUS: I certify that my clinical findings support that this patient is homebound (i.e., Due to illness or injury, pt requires aid of supportive devices such as crutches, cane, wheelchairs, walkers, the use of special transportation or the assistance of another person to leave their place of residence. There is a normal inability to leave the home and doing so requires considerable and taxing effort. Other absences are for medical reasons / religious services and are infrequent or of short duration when for other reasons). If current dressing causes regression in wound condition, may D/C ordered dressing product/s and apply Normal Saline Moist Dressing daily until next [place] / Other MD appointment. [place] of regression in wound condition at [phone]. Please direct any NON-WOUND related issues/requests for orders to patient's Primary Care Physician Negative Pressure Wound Therapy: Wound #2 Right Gluteus: Discontinue NPWT. she is going to be away on vacation for the next 2 weeks and I believe it will be better for her to return her wound VAC to the company and continue with local care. After review today I have recommended: 1. Silver alginate to be applied to the right ankle, changed every other day 2. Silver alginate with a gauze bolster to the right gluteal region to be changed 3 times a week. 3. adequate protein, vitamin A, vitamin C and zinc 4. return to see [place] when she is back from vacation Electronic Signature(s) Signed: 07/18/2017 3:31:36 PM By: Christin Fudge MD, FACS Entered By: Christin Fudge on 07/18/2017 11:27:20 Wilhelmi, Cj Chauncey [name] ([id number]) Marley, Cheryln S. ([id number]) -------------------------------------------------------------------------------- SuperBill Details Patient Name: Vandam,  Knollwood S. Date of Service: 07/18/2017 Medical Record Number: [id number] Patient Account Number: [id number] Date of Birth/Sex:  May 06, 1966 (51 y.o. 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, right ankle and foot Facility Procedures CPT4 Code: [id number] Description: 99213 - WOUND CARE VISIT-LEV 3 EST PT Modifier: Quantity: 1 Physician Procedures CPT4 Code Description: [id number] - WC PHYS LEVEL 3 - EST PT ICD-10 Description Diagnosis E11.621 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

## 2017-07-20 NOTE — Progress Notes (Signed)
CHICQUITA, MENDEL ([id number]) Visit Report for 07/18/2017 Arrival Information Details Patient Name: [name], [name]. Date of Service: 07/18/2017 10:15 AM Medical Record Number: [id number] Patient Account Number: [id number] Date of Birth/Sex: Sep 25, 1966 (51 y.o. Female) Treating RN: Montey Hora Primary Care Renard Caperton: Delight Stare Other Clinician: Referring Godric Lavell: Delight Stare Treating Daquan Crapps/Extender: Frann Rider in Treatment: 25 Visit Information History Since Last Visit Added or deleted any medications: No Patient Arrived: Cane Any new allergies or adverse reactions: No Arrival Time: 10:28 Had a fall or experienced change in No Accompanied By: self activities of daily living that may affect Transfer Assistance: None risk of falls: Patient Identification Verified: Yes Signs or symptoms of abuse/neglect since last No Secondary Verification Process Completed: Yes visito Patient Requires Transmission-Based No Hospitalized since last visit: No Precautions: Has Dressing in Place as Prescribed: Yes Patient Has Alerts: No Pain Present Now: No Electronic Signature(s) Signed: 07/18/2017 4:32:48 PM By: Montey Hora Entered By: Montey Hora on 07/18/2017 10:28:58 Clinkscales, Atina Chauncey Cruel ([id number]) -------------------------------------------------------------------------------- Clinic Level of Care Assessment Details Patient Name: [name]. Date of Service: 07/18/2017 10:15 AM Medical Record Number: [id number] Patient Account Number: [id number] Date of Birth/Sex: 01/06/1966 (51 y.o. Female) Treating RN: Montey Hora Primary Care Maison Agrusa: Delight Stare Other Clinician: Referring Shain Pauwels: Delight Stare Treating Serrina Minogue/Extender: Frann Rider in Treatment: 25 Clinic Level of Care Assessment Items TOOL 4 Quantity Score []  - Use when only an EandM is performed on FOLLOW-UP visit 0 ASSESSMENTS - Nursing Assessment / Reassessment X - Reassessment of  Co-morbidities (includes updates in patient status) 1 10 X - Reassessment of Adherence to Treatment Plan 1 5 ASSESSMENTS - Wound and Skin Assessment / Reassessment []  - Simple Wound Assessment / Reassessment - one wound 0 X - Complex Wound Assessment / Reassessment - multiple wounds 2 5 []  - Dermatologic / Skin Assessment (not related to wound area) 0 ASSESSMENTS - Focused Assessment []  - Circumferential Edema Measurements - multi extremities 0 []  - Nutritional Assessment / Counseling / Intervention 0 X - Lower Extremity Assessment (monofilament, tuning fork, pulses) 1 5 []  - Peripheral Arterial Disease Assessment (using hand held doppler) 0 ASSESSMENTS - Ostomy and/or Continence Assessment and Care []  - Incontinence Assessment and Management 0 []  - Ostomy Care Assessment and Management (repouching, etc.) 0 PROCESS - Coordination of Care X - Simple Patient / Family Education for ongoing care 1 15 []  - Complex (extensive) Patient / Family Education for ongoing care 0 []  - Staff obtains Programmer, systems, Records, Test Results / Process Orders 0 []  - Staff telephones HHA, Nursing Homes / Clarify orders / etc 0 []  - Routine Transfer to another Facility (non-emergent condition) 0 Littler, Dominika S. ([id number]) []  - Routine Hospital Admission (non-emergent condition) 0 []  - New Admissions / Biomedical engineer / Ordering NPWT, Apligraf, etc. 0 []  - Emergency Hospital Admission (emergent condition) 0 X - Simple Discharge Coordination 1 10 []  - Complex (extensive) Discharge Coordination 0 PROCESS - Special Needs []  - Pediatric / Minor Patient Management 0 []  - Isolation Patient Management 0 []  - Hearing / Language / Visual special needs 0 []  - Assessment of Community assistance (transportation, D/C planning, etc.) 0 []  - Additional assistance / Altered mentation 0 []  - Support Surface(s) Assessment (bed, cushion, seat, etc.) 0 INTERVENTIONS - Wound Cleansing / Measurement []  - Simple Wound  Cleansing - one wound 0 X - Complex Wound Cleansing - multiple wounds 2 5 X - Wound Imaging (photographs - any number of wounds) 1 5 []  -  Wound Tracing (instead of photographs) 0 []  - Simple Wound Measurement - one wound 0 X - Complex Wound Measurement - multiple wounds 2 5 INTERVENTIONS - Wound Dressings X - Small Wound Dressing one or multiple wounds 2 10 []  - Medium Wound Dressing one or multiple wounds 0 []  - Large Wound Dressing one or multiple wounds 0 []  - Application of Medications - topical 0 []  - Application of Medications - injection 0 INTERVENTIONS - Miscellaneous []  - External ear exam 0 Decarli, Deandrea S. ([id number]) []  - Specimen Collection (cultures, biopsies, blood, body fluids, etc.) 0 []  - Specimen(s) / Culture(s) sent or taken to Lab for analysis 0 []  - Patient Transfer (multiple staff / Harrel Lemon Lift / Similar devices) 0 []  - Simple Staple / Suture removal (25 or less) 0 []  - Complex Staple / Suture removal (26 or more) 0 []  - Hypo / Hyperglycemic Management (close monitor of Blood Glucose) 0 []  - Ankle / Brachial Index (ABI) - do not check if billed separately 0 X - Vital Signs 1 5 Has the patient been seen at the hospital within the last three years: Yes Total Score: 105 Level Of Care: New/Established - Level 3 Electronic Signature(s) Signed: 07/18/2017 4:32:48 PM By: Montey Hora Entered By: Montey Hora on 07/18/2017 16:18:33 Modi, Denishia Chauncey Cruel ([id number]) -------------------------------------------------------------------------------- Encounter Discharge Information Details Patient Name: [name]. Date of Service: 07/18/2017 10:15 AM Medical Record Number: [id number] Patient Account Number: [id number] Date of Birth/Sex: June 15, 1966 (51 y.o. Female) Treating RN: Montey Hora Primary Care Delfin Squillace: Delight Stare Other Clinician: Referring Jaley Yan: Delight Stare Treating Delcia Spitzley/Extender: Frann Rider in Treatment: 25 Encounter Discharge  Information Items Discharge Pain Level: 0 Discharge Condition: Stable Ambulatory Status: Cane Discharge Destination: Home Transportation: Private Auto Accompanied By: self Schedule Follow-up Appointment: Yes Medication Reconciliation completed No and provided to Patient/Care Kloe Oates: Provided on Clinical Summary of Care: 07/18/2017 Form Type Recipient Paper Patient RM Electronic Signature(s) Signed: 07/18/2017 4:19:56 PM By: Montey Hora Entered By: Montey Hora on 07/18/2017 16:19:55 Hallquist, Sianna Chauncey Cruel ([id number]) -------------------------------------------------------------------------------- Lower Extremity Assessment Details Patient Name: [name]. Date of Service: 07/18/2017 10:15 AM Medical Record Number: [id number] Patient Account Number: [id number] Date of Birth/Sex: 09-24-1966 (51 y.o. Female) Treating RN: Montey Hora Primary Care Azaiah Licciardi: Delight Stare Other Clinician: Referring Camay Pedigo: Delight Stare Treating Damoni Erker/Extender: Frann Rider in Treatment: 25 Vascular Assessment Pulses: Dorsalis Pedis Palpable: [Right:Yes] Posterior Tibial Extremity colors, hair growth, and conditions: Extremity Color: [Right:Normal] Hair Growth on Extremity: [Right:No] Temperature of Extremity: [Right:Warm] Capillary Refill: [Right:< 3 seconds] Electronic Signature(s) Signed: 07/18/2017 4:32:48 PM By: Montey Hora Entered By: Montey Hora on 07/18/2017 10:42:05 Baise, Darinda S. ([id number]) -------------------------------------------------------------------------------- Multi Wound Chart Details Patient Name: [name]. Date of Service: 07/18/2017 10:15 AM Medical Record Number: [id number] Patient Account Number: [id number] Date of Birth/Sex: [birth year] (51 y.o. Female) Treating RN: Montey Hora Primary Care Ella Golomb: Delight Stare Other Clinician: Referring Ples Trudel: Delight Stare Treating Tyriana Helmkamp/Extender: Frann Rider in Treatment:  25 Vital Signs Height(in): 67 Pulse(bpm): 89 Weight(lbs): 137 Blood Pressure (mmHg): Body Mass Index(BMI): 21 Temperature(F): 97.7 Respiratory Rate 18 (breaths/min): Photos: [1:No Photos] [2:No Photos] [N/A:N/A] Wound Location: [1:Right Malleolus - Lateral] [2:Right Gluteus] [N/A:N/A] Wounding Event: [1:Gradually Appeared] [2:Gradually Appeared] [N/A:N/A] Primary Etiology: [1:Diabetic Wound/Ulcer of the Lower Extremity] [2:Open Surgical Wound] [N/A:N/A] Comorbid History: [1:Anemia, Arrhythmia, Coronary Artery Disease, Hypotension, Peripheral Arterial Disease, Type II Diabetes, Osteoarthritis] [2:Anemia, Arrhythmia, Coronary Artery Disease, Hypotension, Peripheral Arterial Disease, Type II Diabetes,  Osteoarthritis] [N/A:N/A] Date Acquired: [1:09/23/2016] [2:09/18/2016] [N/A:N/A]  Weeks of Treatment: [1:25] [2:25] [N/A:N/A] Wound Status: [1:Open] [2:Open] [N/A:N/A] Measurements L x W x D 1x1.3x0.3 [2:2.3x1.3x1.2] [N/A:N/A] (cm) Area (cm) : [1:1.021] [2:2.348] [N/A:N/A] Volume (cm) : [1:0.306] [2:2.818] [N/A:N/A] % Reduction in Area: [1:74.00%] [2:87.80%] [N/A:N/A] % Reduction in Volume: 84.40% [2:97.10%] [N/A:N/A] Classification: [1:Grade 2] [2:Full Thickness Without Exposed Support Structures] [N/A:N/A] Exudate Amount: [1:Large] [2:Large] [N/A:N/A] Exudate Type: [1:Serosanguineous] [2:Serous] [N/A:N/A] Exudate Color: [1:red, brown] [2:amber] [N/A:N/A] Wound Margin: [1:Distinct, outline attached Distinct, outline attached] [N/A:N/A] Granulation Amount: [1:Large (67-100%)] [2:Large (67-100%)] [N/A:N/A] Granulation Quality: [1:Pink] [2:Red, Pink] [N/A:N/A] Necrotic Amount: [1:Small (1-33%)] [2:Small (1-33%)] [N/A:N/A] Exposed Structures: [N/A:N/A] Fat Layer (Subcutaneous Fat Layer (Subcutaneous Tissue) Exposed: Yes Tissue) Exposed: Yes Fascia: No Fascia: No Tendon: No Tendon: No Muscle: No Muscle: No Joint: No Joint: No Bone: No Bone: No Epithelialization: None None  N/A Periwound Skin Texture: Induration: Yes No Abnormalities Noted N/A Excoriation: No Callus: No Crepitus: No Rash: No Scarring: No Periwound Skin Maceration: Yes Maceration: Yes N/A Moisture: Dry/Scaly: No Periwound Skin Color: Erythema: Yes Ecchymosis: Yes N/A Atrophie Blanche: No Cyanosis: No Ecchymosis: No Hemosiderin Staining: No Mottled: No Pallor: No Rubor: No Erythema Location: Circumferential N/A N/A Temperature: No Abnormality No Abnormality N/A Tenderness on Yes Yes N/A Palpation: Wound Preparation: Ulcer Cleansing: Ulcer Cleansing: N/A Rinsed/Irrigated with Rinsed/Irrigated with Saline Saline Topical Anesthetic Topical Anesthetic Applied: Other: lidocaine Applied: Other: lidocaine 4% 4% Treatment Notes Electronic Signature(s) Signed: 07/18/2017 3:31:36 PM By: Christin Fudge MD, FACS Entered By: Christin Fudge on 07/18/2017 11:25:02 Kehl, [name] ([id number]) -------------------------------------------------------------------------------- [place] Details Patient Name: [name], [name]. Date of Service: 07/18/2017 10:15 AM Medical Record Number: [id number] Patient Account Number: [id number] Date of Birth/Sex: 10-31-1966 (51 y.o. Female) Treating RN: Montey Hora Primary Care Haunani Dickard: Delight Stare Other Clinician: Referring Stacy Sailer: Delight Stare Treating Lamin Chandley/Extender: Frann Rider in Treatment: 25 Active Inactive ` Orientation to the Wound Care Program Nursing Diagnoses: Knowledge deficit related to the wound healing center program Goals: Patient/caregiver will verbalize understanding of the [facility] Program Date Initiated: 01/23/2017 Target Resolution Date: 05/25/2017 Goal Status: Active Interventions: Provide education on orientation to the wound center Notes: ` Peripheral Neuropathy Nursing Diagnoses: Knowledge deficit related to disease process and management of peripheral neurovascular  dysfunction Potential alteration in peripheral tissue perfusion (select prior to confirmation of diagnosis) Goals: Patient/caregiver will verbalize understanding of disease process and disease management Date Initiated: 01/23/2017 Target Resolution Date: 05/25/2017 Goal Status: Active Interventions: Assess signs and symptoms of neuropathy upon admission and as needed Provide education on Management of Neuropathy and Related Ulcers Provide education on Management of Neuropathy upon discharge from the [facility] Activities: Test ordered outside of clinic : 01/23/2017 Notes: GENEVIENE, TESCH ([id number]) ` Pressure Nursing Diagnoses: Knowledge deficit related to causes and risk factors for pressure ulcer development Knowledge deficit related to management of pressures ulcers Potential for impaired tissue integrity related to pressure, friction, moisture, and shear Goals: Patient will remain free from development of additional pressure ulcers Date Initiated: 01/23/2017 Target Resolution Date: 05/25/2017 Goal Status: Active Patient will remain free of pressure ulcers Date Initiated: 01/23/2017 Target Resolution Date: 05/25/2017 Goal Status: Active Patient/caregiver will verbalize risk factors for pressure ulcer development Date Initiated: 01/23/2017 Target Resolution Date: 05/25/2017 Goal Status: Active Patient/caregiver will verbalize understanding of pressure ulcer management Date Initiated: 01/23/2017 Target Resolution Date: 05/25/2017 Goal Status: Active Interventions: Assess: immobility, friction, shearing, incontinence upon admission and as needed Assess offloading mechanisms upon admission and as needed Assess potential for pressure ulcer upon  admission and as needed Provide education on pressure ulcers Treatment Activities: Patient referred for pressure reduction/relief devices : 01/23/2017 Patient referred for seating evaluation to ensure proper offloading : 01/23/2017 Pressure  reduction/relief device ordered : 01/23/2017 Notes: ` Wound/Skin Impairment Nursing Diagnoses: Impaired tissue integrity Knowledge deficit related to smoking impact on wound healing Knowledge deficit related to ulceration/compromised skin integrity Goals: Patient/caregiver will verbalize understanding of skin care regimen TAWNIE, EHRESMAN ([id number]) Date Initiated: 01/23/2017 Target Resolution Date: 05/25/2017 Goal Status: Active Ulcer/skin breakdown will have a volume reduction of 30% by week 4 Date Initiated: 01/23/2017 Target Resolution Date: 05/25/2017 Goal Status: Active Ulcer/skin breakdown will have a volume reduction of 50% by week 8 Date Initiated: 01/23/2017 Target Resolution Date: 05/25/2017 Goal Status: Active Ulcer/skin breakdown will have a volume reduction of 80% by week 12 Date Initiated: 01/23/2017 Target Resolution Date: 05/25/2017 Goal Status: Active Ulcer/skin breakdown will heal within 14 weeks Date Initiated: 01/23/2017 Target Resolution Date: 05/25/2017 Goal Status: Active Interventions: Assess patient/caregiver ability to obtain necessary supplies Assess patient/caregiver ability to perform ulcer/skin care regimen upon admission and as needed Assess ulceration(s) every visit Provide education on smoking Provide education on ulcer and skin care Treatment Activities: Patient referred to home care : 01/23/2017 Referred to DME Kesley Gaffey for dressing supplies : 01/23/2017 Skin care regimen initiated : 01/23/2017 Topical wound management initiated : 01/23/2017 Notes: Electronic Signature(s) Signed: 07/18/2017 4:32:48 PM By: Montey Hora Entered By: Montey Hora on 07/18/2017 10:42:13 Hausner, Alyha S. ([id number]) -------------------------------------------------------------------------------- Pain Assessment Details Patient Name: [name]. Date of Service: 07/18/2017 10:15 AM Medical Record Number: [id number] Patient Account Number: [id number] Date of Birth/Sex:  [birth year] (51 y.o. Female) Treating RN: Montey Hora Primary Care Nykia Turko: Delight Stare Other Clinician: Referring Nela Bascom: Delight Stare Treating Ebb Carelock/Extender: Frann Rider in Treatment: 25 Active Problems Location of Pain Severity and Description of Pain Patient Has Paino Yes Site Locations Pain Location: Pain in Ulcers With Dressing Change: Yes Duration of the Pain. Constant / Intermittento Intermittent Pain Management and Medication Current Pain Management: Electronic Signature(s) Signed: 07/18/2017 4:32:48 PM By: Montey Hora Entered By: Montey Hora on 07/18/2017 10:29:11 Oceguera, [name] ([id number]) -------------------------------------------------------------------------------- Patient/Caregiver Education Details Patient Name: [name]. Date of Service: 07/18/2017 10:15 AM Medical Record Number: [id number] Patient Account Number: [id number] Date of Birth/Gender: 20-Oct-1966 (51 y.o. Female) Treating RN: Montey Hora Primary Care Physician: Delight Stare Other Clinician: Referring Physician: Delight Stare Treating Physician/Extender: Frann Rider in Treatment: 25 Education Assessment Education Provided To: Patient Education Topics Provided Wound/Skin Impairment: Handouts: Other: call if needed before next visit for any problems Methods: Explain/Verbal Responses: State content correctly Electronic Signature(s) Signed: 07/18/2017 4:32:48 PM By: Montey Hora Entered By: Montey Hora on 07/18/2017 16:20:28 Stoney, Nevaen S. ([id number]) -------------------------------------------------------------------------------- Wound Assessment Details Patient Name: [name]. Date of Service: 07/18/2017 10:15 AM Medical Record Number: [id number] Patient Account Number: [id number] Date of Birth/Sex: December 26, 1965 (51 y.o. Female) Treating RN: Montey Hora Primary Care Siddhanth Denk: Delight Stare Other Clinician: Referring Janeen Watson: Delight Stare Treating Nayshawn Mesta/Extender: Frann Rider in Treatment: 25 Wound Status Wound Number: 1 Primary Diabetic Wound/Ulcer of the Lower Etiology: Extremity Wound Location: Right Malleolus - Lateral Wound Open Wounding Event: Gradually Appeared Status: Date Acquired: 09/23/2016 Comorbid Anemia, Arrhythmia, Coronary Artery Weeks Of Treatment: 25 History: Disease, Hypotension, Peripheral Clustered Wound: No Arterial Disease, Type II Diabetes, Osteoarthritis Photos Photo Uploaded By: Montey Hora on 07/18/2017 16:30:04 Wound Measurements Length: (cm) 1 Width: (cm) 1.3 Depth: (cm) 0.3 Area: (cm) 1.021 Volume: (cm)  0.306 % Reduction in Area: 74% % Reduction in Volume: 84.4% Epithelialization: None Tunneling: No Undermining: No Wound Description Classification: Grade 2 Foul Odor Aft Wound Margin: Distinct, outline attached Slough/Fibrin Exudate Amount: Large Exudate Type: Serosanguineous Exudate Color: red, brown er Cleansing: No o Yes Wound Bed Granulation Amount: Large (67-100%) Exposed Structure Granulation Quality: Pink Fascia Exposed: No Necrotic Amount: Small (1-33%) Fat Layer (Subcutaneous Tissue) Exposed: Yes Jester, Miana S. ([id number]) Necrotic Quality: Adherent Slough Tendon Exposed: No Muscle Exposed: No Joint Exposed: No Bone Exposed: No Periwound Skin Texture Texture Color No Abnormalities Noted: No No Abnormalities Noted: No Callus: No Atrophie Blanche: No Crepitus: No Cyanosis: No Excoriation: No Ecchymosis: No Induration: Yes Erythema: Yes Rash: No Erythema Location: Circumferential Scarring: No Hemosiderin Staining: No Mottled: No Moisture Pallor: No No Abnormalities Noted: No Rubor: No Dry / Scaly: No Maceration: Yes Temperature / Pain Temperature: No Abnormality Tenderness on Palpation: Yes Wound Preparation Ulcer Cleansing: Rinsed/Irrigated with Saline Topical Anesthetic Applied: Other: lidocaine 4%, Treatment  Notes Wound #1 (Right, Lateral Malleolus) 1. Cleansed with: Clean wound with Normal Saline 2. Anesthetic Topical Lidocaine 4% cream to wound bed prior to debridement 3. Peri-wound Care: Skin Prep 4. Dressing Applied: Aquacel Ag 5. Secondary Dressing Applied Bordered Foam Dressing Electronic Signature(s) Signed: 07/18/2017 4:32:48 PM By: Montey Hora Entered By: Montey Hora on 07/18/2017 10:41:27 Pestka, [name] ([id number]) -------------------------------------------------------------------------------- Wound Assessment Details Patient Name: [name]. Date of Service: 07/18/2017 10:15 AM Medical Record Number: [id number] Patient Account Number: [id number] Date of Birth/Sex: 03/07/1966 (51 y.o. Female) Treating RN: Montey Hora Primary Care Jenee Spaugh: Delight Stare Other Clinician: Referring Mannie Ohlin: Delight Stare Treating Mishayla Sliwinski/Extender: Frann Rider in Treatment: 25 Wound Status Wound Number: 2 Primary Open Surgical Wound Etiology: Wound Location: Right Gluteus Wound Open Wounding Event: Gradually Appeared Status: Date Acquired: 09/18/2016 Comorbid Anemia, Arrhythmia, Coronary Artery Weeks Of Treatment: 25 History: Disease, Hypotension, Peripheral Clustered Wound: No Arterial Disease, Type II Diabetes, Osteoarthritis Photos Photo Uploaded By: Montey Hora on 07/18/2017 16:30:34 Wound Measurements Length: (cm) 2.3 Width: (cm) 1.3 Depth: (cm) 1.2 Area: (cm) 2.348 Volume: (cm) 2.818 % Reduction in Area: 87.8% % Reduction in Volume: 97.1% Epithelialization: None Tunneling: No Undermining: No Wound Description Full Thickness Without Exposed Classification: Support Structures Wound Margin: Distinct, outline attached Exudate Large Amount: Exudate Type: Serous Exudate Color: amber Foul Odor After Cleansing: No Slough/Fibrino No Wound Bed Granulation Amount: Large (67-100%) Exposed Structure Luca, Lielle S. ([id number]) Granulation  Quality: Red, Pink Fascia Exposed: No Necrotic Amount: Small (1-33%) Fat Layer (Subcutaneous Tissue) Exposed: Yes Necrotic Quality: Adherent Slough Tendon Exposed: No Muscle Exposed: No Joint Exposed: No Bone Exposed: No Periwound Skin Texture Texture Color No Abnormalities Noted: No No Abnormalities Noted: No Ecchymosis: Yes Moisture No Abnormalities Noted: No Temperature / Pain Maceration: Yes Temperature: No Abnormality Tenderness on Palpation: Yes Wound Preparation Ulcer Cleansing: Rinsed/Irrigated with Saline Topical Anesthetic Applied: Other: lidocaine 4%, Treatment Notes Wound #2 (Right Gluteus) 1. Cleansed with: Clean wound with Normal Saline 2. Anesthetic Topical Lidocaine 4% cream to wound bed prior to debridement 3. Peri-wound Care: Skin Prep 4. Dressing Applied: Aquacel Ag 5. Secondary Dressing Applied Bordered Foam Dressing Electronic Signature(s) Signed: 07/18/2017 4:32:48 PM By: Montey Hora Entered By: Montey Hora on 07/18/2017 10:41:46 [place], [name] ([id number]) -------------------------------------------------------------------------------- Vitals Details Patient Name: [name]. Date of Service: 07/18/2017 10:15 AM Medical Record Number: [id number] Patient Account Number: [id number] Date of Birth/Sex: 1966/09/20 (51 y.o. Female) Treating RN: Montey Hora Primary Care Emelda Kohlbeck: Delight Stare Other Clinician:  Referring Jesilyn Easom: Delight Stare Treating Macrae Wiegman/Extender: Frann Rider in Treatment: 25 Vital Signs Time Taken: 10:29 Temperature (F): 97.7 Height (in): 67 Pulse (bpm): 89 Weight (lbs): 137 Respiratory Rate (breaths/min): 18 Body Mass Index (BMI): 21.5 Reference Range: 80 - 120 mg / dl Electronic Signature(s) Signed: 07/18/2017 4:32:48 PM By: Montey Hora Entered By: Montey Hora on 07/18/2017 10:30:06

## 2017-07-23 ENCOUNTER — Ambulatory Visit: Payer: Medicare Other | Admitting: Infectious Disease

## 2017-08-08 ENCOUNTER — Encounter: Payer: Medicare Other | Attending: Surgery | Admitting: Surgery

## 2017-08-08 DIAGNOSIS — E1151 Type 2 diabetes mellitus with diabetic peripheral angiopathy without gangrene: Secondary | ICD-10-CM | POA: Diagnosis not present

## 2017-08-08 DIAGNOSIS — L89314 Pressure ulcer of right buttock, stage 4: Secondary | ICD-10-CM | POA: Diagnosis not present

## 2017-08-08 DIAGNOSIS — M86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, right ankle and foot: Secondary | ICD-10-CM | POA: Diagnosis not present

## 2017-08-08 DIAGNOSIS — L97312 Non-pressure chronic ulcer of right ankle with fat layer exposed: Secondary | ICD-10-CM | POA: Diagnosis not present

## 2017-08-08 DIAGNOSIS — I70233 Atherosclerosis of native arteries of right leg with ulceration of ankle: Secondary | ICD-10-CM | POA: Diagnosis not present

## 2017-08-08 DIAGNOSIS — E1140 Type 2 diabetes mellitus with diabetic neuropathy, unspecified: Secondary | ICD-10-CM | POA: Insufficient documentation

## 2017-08-08 DIAGNOSIS — F17218 Nicotine dependence, cigarettes, with other nicotine-induced disorders: Secondary | ICD-10-CM | POA: Insufficient documentation

## 2017-08-08 DIAGNOSIS — E11622 Type 2 diabetes mellitus with other skin ulcer: Secondary | ICD-10-CM | POA: Insufficient documentation

## 2017-08-10 NOTE — Progress Notes (Signed)
Jamie, [name] ([id number]) Visit Report for 08/08/2017 Chief Complaint Document Details Patient Name: [name], [name]. Date of Service: 08/08/2017 2:30 PM Medical Record Number: [id number] Patient Account Number: [id number] Date of Birth/Sex: June 22, 1966 (51 y.o. Female) Treating RN: Montey Hora Primary Care Provider: Delight Stare Other Clinician: Referring Provider: Delight Stare Treating Provider/Extender: Frann Rider in Treatment: 28 Information Obtained from: Patient Chief Complaint She is here in follow up evaluation for right ischial and right lateral malleolus Electronic Signature(s) Signed: 08/08/2017 4:09:30 PM By: Christin Fudge MD, FACS Entered By: Christin Fudge on 08/08/2017 15:37:36 Lobos, [name] ([id number]) -------------------------------------------------------------------------------- HPI Details Patient Name: [name]. Date of Service: 08/08/2017 2:30 PM Medical Record Number: [id number] Patient Account Number: [id number] Date of Birth/Sex: Dec 04, 1965 (51 y.o. Female) Treating RN: Montey Hora Primary Care Provider: Delight Stare Other Clinician: Referring Provider: Delight Stare Treating Provider/Extender: Frann Rider in Treatment: 28 History of Present Illness Location: right lateral ankle and right gluteal area Quality: Patient reports experiencing a sharp pain to affected area(s). Severity: Patient states wound are getting better slowly Duration: Patient has had the wound for > 4 months prior to seeking treatment at the wound center Timing: Pain in wound is constant (hurts all the time) Context: The wound occurred when the patient was in hospital with a necrotizing fasciitis of the right gluteal area and a ulceration on the right ankle Modifying Factors: Other treatment(s) tried include:admitted to the hospital for IV antibiotics and a full workup and has also had a recent angiogram Associated Signs and Symptoms: Patient reports  having increase discharge. HPI Description: 51 year old patient was sent to [place] from [facility] where she was seen by Dr. Sherril Cong for a left ankle ulceration and was recently hospitalized with hypotension and sepsis. She was treated with IV antibiotics and has been scheduled to see Dr. Sharol Given. She was seen by vascular surgery who recommended a femoral bypass but surgery has been delayed until her sacral wound from last year's necrotizing fasciitis has healed. Was seeing the wound care team at [facility] but wanted to change over. She is a smoker and smokes a pack of cigarettes a day The patient was recently admitted in [place] between February 2 and February 14. She had a follow- up to see vascular surgery, orthopedic surgery and infectious disease. 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 Review of the electronic medical records reveals that Dr. 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 An MRI showed edema in the bone which was consistent with osteomyelitis and the chronic ulcer and may need surgical intervention. She also had a sacral decubitus ulcer which was treated with the wound VAC. On 01/07/2017 she was taken up by the vascular surgeon Dr. Trula Slade for an abdominal aortogram and bilateral lower extremity runoff, for a history of having bilateral femoropopliteal bypass graft as well as external iliac stenting on the right and stenting of her bypass graft. She had developed a nonhealing wound on her right ankle and there was a possibility of a  femoral occlusion. the findings were noted and the impression was a surgical revascularization with a aorto  bifemoral bypass graft. Both the femoropopliteal bypass grafts were patent. 01/31/2017 -- x-ray of the right hip and pelvis -- IMPRESSION:No radiographic evidence of acute osteomyelitis. Normal-appearing right hip joint space for age. No acute bony abnormality of the hip. 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 Since seeing her last week she has not had an appointment with infectious disease or the orthopedic specialist yet. MEGHAM, DWYER ([id number]) 02/06/2017 -- the patient missed a couple of appointments yesterday due to the weather but other than that has apparently given up smoking for the last 5 days. 02/13/2017 -- she has rescheduled her infectious disease appointment and also the appointment with the orthopedic surgeon Dr. Sharol Given 03/06/2017 -- she was seen by Dr. Lucianne Lei dam of infectious disease on 03/05/2017 and he recommendedIV antibiotics but the patient did not want to have that and he has given her Augmentin and doxycycline and will reevaluate her in 2 months time. 03/13/2017 -- he saw Dr. Trula Slade regarding her vascular issues and he would like to wait to the sacral wound is completely closed before considering aortobifemoral bypass graft. He will see her back in 2 months time. She was also seen by Dr. Sharol Given of orthopedics who recommended continue wound care dressings and follow in the office in about 2 months time. he also recommended 3 view radiographs of the right ankle at follow-up. 03/28/2017 -- she did have a PICC line placed and Dr. Lucianne Lei dam has begun IV vancomycin and ceftriaxone. she is going to continue this until she sees him in approximately a month's time 04/18/2017 -- we had applied for a skin substitute for the patient's care but her copayment is going to be about $300 a piece and the patient will not  be able to afford this. 05/08/17 on evaluation today patient appears to potentially have a fungal rash in the periwound region of the sacral area. This also seems to extend into the inguinal creases and factional region as well. She is tender to palpation with light rubbing over the region of the periwound and the skin in this region does have a beefy red appearance. Everything seems to point to a fungal infection at this time. She has been tolerating the dressing changes up to this point. There is no evidence of infection otherwise. 05/16/2017 -- was seen by Dr. 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 He did not think that she is making progress and was not able to examine her sacral decubitus ulcer as the patient would not allow it. She was switched to Bactrim DS 2 tablets twice a day since finishing her antibiotics. Her ESR was 76 and a CRP was 2.8. 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 05/23/17 on evaluation today patient's wound appears to be doing about the same in regard to the ankle as well as the gluteal area. She apparently has issues with the one back staying in place and she states the sill seems to break up the inferior aspect. She does have home help coming out to apply the wound VAC. She has been tolerating the dressing changes without any problem although she has had some increased pain in the gluteal region due to a fall she sustained and she feels like she brews that area  and she had pain actually radiating down her leg which is slowly beginning to improve. There's no evidence of infection. 05/29/17 Unfortunately on evaluation today patient's wounds both in regard to the ankle as well as in regard to her gluteal region on the right appear to be measuring the same as last week's evaluation.  She also tells me that she is having soreness today in regard to the right gluteal area because she had a visitor who came to see her a couple days ago and stayed for six hours and she had to set up for that entire length of time visiting. This has called some increased discomfort. Normally she does not set up for those long periods of time. Fortunately there's no evidence of infection and no worsening of the wounds. 06/13/2017 -- the patient has had a lot of health issues including perfuse diarrhea and generalized weakness and I have instructed her to see her PCP as soon as possible and if things get worse to go to the ER. She has not brought her wound VAC today. She continues to be on oral antibiotics 06/30/17- she is here in follow up evaluation for right ischial and right lateral malleolus ulcer. She continues with negative pressure wound therapy to the ischial wound. She is currently on a diabetic therapy per JEANEE, FABRE. ([id number]) infectious disease, although she does state that she inconsistently takes it. She was advised to take her antibiotic as prescribed 07/11/2017 -- she has been unable to use a wound VAC regularly because she loses this evening and it has not been possible for her to use it. 07/18/2017 -- she is much more comfortable and looks better without the wound VAC and at this stage I believe it will be better for her to return the machine and continue with local care. 08/07/2017 -- she is back up to 3 weeks due to a vacation and during this time her wound VAC was returned. She has been doing local dressings Electronic Signature(s) Signed: 08/08/2017 4:09:30 PM By: Christin Fudge MD, FACS Entered By: Christin Fudge on 08/08/2017 15:38:14 Teffeteller, [name] ([id number]) -------------------------------------------------------------------------------- Physical Exam Details Patient Name: [name], [name] Date of Service: 08/08/2017 2:30 PM Medical Record Number:  [id number] Patient Account Number: [id number] Date of Birth/Sex: [birth year] (51 y.o. Female) Treating RN: Montey Hora Primary Care Provider: Delight Stare Other Clinician: Referring Provider: Delight Stare Treating Provider/Extender: Frann Rider in Treatment: 28 Constitutional . Pulse regular. Respirations normal and unlabored. Afebrile. . Eyes Nonicteric. Reactive to light. Ears, Nose, Mouth, and Throat Lips, teeth, and gums WNL.Marland Kitchen Moist mucosa without lesions. Neck supple and nontender. No palpable supraclavicular or cervical adenopathy. Normal sized without goiter. Respiratory WNL. No retractions.. Cardiovascular Pedal Pulses WNL. No clubbing, cyanosis or edema. Lymphatic No adneopathy. No adenopathy. No adenopathy. Musculoskeletal Adexa without tenderness or enlargement.. Digits and nails w/o clubbing, cyanosis, infection, petechiae, ischemia, or inflammatory conditions.. Integumentary (Hair, Skin) No suspicious lesions. No crepitus or fluctuance. No peri-wound warmth or erythema. No masses.Marland Kitchen Psychiatric Judgement and insight Intact.. No evidence of depression, anxiety, or agitation.. Notes the patient's right lateral ankle wound is looking clean and no sharp debridement was required. We will continue with silver alginate locally. The wound on her right gluteal area is not macerated and the depths of the wound is clean. I do not believe she needs a wound VAC any longer as it can be difficult to keep in place Electronic Signature(s) Signed: 08/08/2017 4:09:30 PM By: Christin Fudge MD, FACS Entered By:  Christin Fudge on 08/08/2017 15:39:41 Tavenner, PRESLEA RHODUS ([id number]) -------------------------------------------------------------------------------- Physician Orders Details Patient Name: NAJEE, COWENS. Date of Service: 08/08/2017 2:30 PM Medical Record Number: [id number] Patient Account Number: [id number] Date of Birth/Sex: [birth year] (51 y.o. Female) Treating RN:  Carolyne Fiscal, Debi Primary Care Provider: Delight Stare Other Clinician: Referring Provider: Delight Stare Treating Provider/Extender: Frann Rider in Treatment: 34 Verbal / Phone Orders: Yes Clinician: Carolyne Fiscal, Debi Read Back and Verified: Yes Diagnosis Coding Wound Cleansing Wound #1 Right,Lateral Malleolus o Clean wound with Normal Saline. Wound #2 Right Gluteus o Clean wound with Normal Saline. Anesthetic Wound #2 Right Gluteus o Topical Lidocaine 4% cream applied to wound bed prior to debridement Primary Wound Dressing Wound #1 Right,Lateral Malleolus o Aquacel Ag Wound #2 Right Gluteus o Aquacel Ag Secondary Dressing Wound #1 Right,Lateral Malleolus o Boardered Foam Dressing Wound #2 Right Gluteus o Boardered Foam Dressing Dressing Change Frequency Wound #1 Right,Lateral Malleolus o Change Dressing Monday, Wednesday, Friday Wound #2 Right Gluteus o Change Dressing Monday, Wednesday, Friday Follow-up Appointments Wound #1 Right,Lateral Malleolus o Return Appointment in: - 3 weeks Sculley, Zaydah S. ([id number]) Wound #2 Right Gluteus o Return Appointment in: - 3 weeks Off-Loading Wound #1 Right,Lateral Malleolus o Turn and reposition every 2 hours Wound #2 Right Gluteus o Turn and reposition every 2 hours [place] #1 [place] Visits - Monday, Wednesday, Friday o Home Health Nurse may visit PRN to address patientos wound care needs. o FACE TO FACE ENCOUNTER: MEDICARE and MEDICAID PATIENTS: I certify that this patient is under my care and that I had a face-to-face encounter that meets the physician face-to-face encounter requirements with this patient on this date. The encounter with the patient was in whole or in part for the following MEDICAL CONDITION: (primary reason for [place]) MEDICAL NECESSITY: I certify, that based on my findings, NURSING services are a  medically necessary home health service. HOME BOUND STATUS: I certify that my clinical findings support that this patient is homebound (i.e., Due to illness or injury, pt requires aid of supportive devices such as crutches, cane, wheelchairs, walkers, the use of special transportation or the assistance of another person to leave their place of residence. There is a normal inability to leave the home and doing so requires considerable and taxing effort. Other absences are for medical reasons / religious services and are infrequent or of short duration when for other reasons). o If current dressing causes regression in wound condition, may D/C ordered dressing product/s and apply Normal Saline Moist Dressing daily until next [place] / Other MD appointment. [place] of regression in wound condition at [phone]. o Please direct any NON-WOUND related issues/requests for orders to patient's Primary Care Physician Wound #2 Right [place] Visits - Monday, Wednesday, Friday o Home Health Nurse may visit PRN to address patientos wound care needs. o FACE TO FACE ENCOUNTER: MEDICARE and MEDICAID PATIENTS: I certify that this patient is under my care and that I had a face-to-face encounter that meets the physician face-to-face encounter requirements with this patient on this date. The encounter with the patient was in whole or in part for the following MEDICAL CONDITION: (primary reason for [place]) MEDICAL NECESSITY: I certify, that based on my findings, NURSING services are a medically necessary home health service. HOME BOUND STATUS: I certify that my clinical findings support that this patient is homebound (i.e., Due to illness or injury, pt requires  aid of supportive devices such as crutches, cane, wheelchairs, walkers, the use of special transportation or the assistance of another person to leave their place of residence.  There is a normal inability to leave the home and doing so requires considerable and taxing effort. Other absences are for medical reasons / religious services and are infrequent or of short duration when for other reasons). MARVA, HENDRYX ([id number]) o If current dressing causes regression in wound condition, may D/C ordered dressing product/s and apply Normal Saline Moist Dressing daily until next [place] / Other MD appointment. [place] of regression in wound condition at [phone]. o Please direct any NON-WOUND related issues/requests for orders to patient's Primary Care Physician Electronic Signature(s) Signed: 08/08/2017 4:09:30 PM By: Christin Fudge MD, FACS Signed: 08/08/2017 5:11:04 PM By: Alric Quan Entered By: Alric Quan on 08/08/2017 15:31:43 Crafts, Evelisse S. ([id number]) -------------------------------------------------------------------------------- Problem List Details Patient Name: [name], [name]. Date of Service: 08/08/2017 2:30 PM Medical Record Number: [id number] Patient Account Number: [id number] Date of Birth/Sex: 1966-03-26 (51 y.o. Female) Treating RN: Montey Hora Primary Care Provider: Delight Stare Other Clinician: Referring Provider: Delight Stare Treating Provider/Extender: Frann Rider in Treatment: 28 Active Problems ICD-10 Encounter Code Description Active Date Diagnosis E11.621 Type 2 diabetes mellitus with foot ulcer 01/23/2017 Yes L89.314 Pressure ulcer of right buttock, stage 4 01/23/2017 Yes L97.312 Non-pressure chronic ulcer of right ankle with fat layer 01/23/2017 Yes exposed F17.218 Nicotine dependence, cigarettes, with other nicotine- 01/23/2017 Yes induced disorders I70.233 Atherosclerosis of native arteries of right leg with 01/23/2017 Yes ulceration of ankle M86.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, right ankle and foot 01/23/2017 Yes Inactive Problems Resolved Problems Electronic  Signature(s) Signed: 08/08/2017 4:09:30 PM By: Christin Fudge MD, FACS Entered By: Christin Fudge on 08/08/2017 15:37:20 Hasley, [name] ([id number]) -------------------------------------------------------------------------------- Progress Note Details Patient Name: [name]. Date of Service: 08/08/2017 2:30 PM Medical Record Number: [id number] Patient Account Number: [id number] Date of Birth/Sex: 1966/06/08 (51 y.o. Female) Treating RN: Montey Hora Primary Care Provider: Delight Stare Other Clinician: Referring Provider: Delight Stare Treating Provider/Extender: Frann Rider in Treatment: 28 Subjective Chief Complaint Information obtained from Patient She is here in follow up evaluation for right ischial and right lateral malleolus History of Present Illness (HPI) The following HPI elements were documented for the patient's wound: Location: right lateral ankle and right gluteal area Quality: Patient reports experiencing a sharp pain to affected area(s). Severity: Patient states wound are getting better slowly Duration: Patient has had the wound for > 4 months prior to seeking treatment at the wound center Timing: Pain in wound is constant (hurts all the time) Context: The wound occurred when the patient was in hospital with a necrotizing fasciitis of the right gluteal area and a ulceration on the right ankle Modifying Factors: Other treatment(s) tried include:admitted to the hospital for IV antibiotics and a full workup and has also had a recent angiogram Associated Signs and Symptoms: Patient reports having increase discharge. 51 year old patient was sent to [place] from [facility] where she was seen by Dr. Sherril Cong for a left ankle ulceration and was recently hospitalized with hypotension and sepsis. She was treated with IV antibiotics and has been scheduled to see Dr. Sharol Given. She was seen by vascular surgery who recommended a femoral bypass but surgery has  been delayed until her sacral wound from last year's necrotizing fasciitis has healed. Was seeing the wound care team at [facility] but wanted to change over. She is a  smoker and smokes a pack of cigarettes a day The patient was recently admitted in [place] between February 2 and February 14. She had a follow- up to see vascular surgery, orthopedic surgery and infectious disease. 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 Review of the electronic medical records reveals that Dr. 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 An MRI showed edema in the bone which was consistent with osteomyelitis and the chronic ulcer and may need surgical intervention. She also had a sacral decubitus ulcer which was treated with the wound VAC. On 01/07/2017 she was taken up by the vascular surgeon Dr. Trula Slade for an abdominal aortogram and bilateral lower extremity runoff, for a history of having bilateral femoropopliteal bypass graft as well as external iliac stenting on the right and stenting of her bypass graft. She had developed a nonhealing wound on her right ankle and there was a possibility of a femoral occlusion. the findings were noted and the impression was a surgical revascularization with a aorto bifemoral bypass graft. Both the femoropopliteal Kirschenbaum, [place]. ([id number]) bypass grafts were patent. 01/31/2017 -- x-ray of the right hip and pelvis -- IMPRESSION:No radiographic evidence of acute osteomyelitis. Normal-appearing right hip joint space for age. No acute bony  abnormality of the hip. 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 Since seeing her last week she has not had an appointment with infectious disease or the orthopedic specialist yet. 02/06/2017 -- the patient missed a couple of appointments yesterday due to the weather but other than that has apparently given up smoking for the last 5 days. 02/13/2017 -- she has rescheduled her infectious disease appointment and also the appointment with the orthopedic surgeon Dr. Sharol Given 03/06/2017 -- she was seen by Dr. Lucianne Lei dam of infectious disease on 03/05/2017 and he recommendedIV antibiotics but the patient did not want to have that and he has given her Augmentin and doxycycline and will reevaluate her in 2 months time. 03/13/2017 -- he saw Dr. Trula Slade regarding her vascular issues and he would like to wait to the sacral wound is completely closed before considering aortobifemoral bypass graft. He will see her back in 2 months time. She was also seen by Dr. Sharol Given of orthopedics who recommended continue wound care dressings and follow in the office in about 2 months time. he also recommended 3 view radiographs of the right ankle at follow-up. 03/28/2017 -- she did have a PICC line placed and Dr. Lucianne Lei dam has begun IV vancomycin and ceftriaxone. she is going to continue this until she sees him in approximately a month's time 04/18/2017 -- we had applied for a skin substitute for the patient's care but her copayment is going to be about $300 a piece and the patient will not be able to afford this. 05/08/17 on evaluation today patient appears to potentially have a fungal rash in the periwound region of the sacral area. This also seems to extend into the inguinal creases and factional region as well. She is tender to palpation with light rubbing over the region of the periwound  and the skin in this region does have a beefy red appearance. Everything seems  to point to a fungal infection at this time. She has been tolerating the dressing changes up to this point. There is no evidence of infection otherwise. 05/16/2017 -- was seen by Dr. 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 He did not think that she is making progress and was not able to examine her sacral decubitus ulcer as the patient would not allow it. She was switched to Bactrim DS 2 tablets twice a day since finishing her antibiotics. Her ESR was 76 and a CRP was 2.8. 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 05/23/17 on evaluation today patient's wound appears to be doing about the same in regard to the ankle as well as the gluteal area. She apparently has issues with the one back staying in place and she states the sill seems to break up the inferior aspect. She does have home help coming out to apply the wound VAC. She has been tolerating the dressing changes without any problem although she has had some increased pain in the gluteal region due to a fall she sustained and she feels like she brews that area and she had pain actually radiating down her leg which is slowly beginning to improve. There's no evidence of infection. 05/29/17 Unfortunately on evaluation today patient's wounds both in regard to the ankle as well as in regard to her gluteal region on the right appear to be measuring the same as last week's evaluation. She also tells me that she is having soreness today in regard to the right gluteal area because she had a visitor who came to see her a couple days ago and stayed for six hours and she had to set up for that entire length of time visiting. This has called some increased discomfort. Normally she does not set up for those long periods of [place], [place]. ([id number]) time. Fortunately  there's no evidence of infection and no worsening of the wounds. 06/13/2017 -- the patient has had a lot of health issues including perfuse diarrhea and generalized weakness and I have instructed her to see her PCP as soon as possible and if things get worse to go to the ER. She has not brought her wound VAC today. She continues to be on oral antibiotics 06/30/17- she is here in follow up evaluation for right ischial and right lateral malleolus ulcer. She continues with negative pressure wound therapy to the ischial wound. She is currently on a diabetic therapy per infectious disease, although she does state that she inconsistently takes it. She was advised to take her antibiotic as prescribed 07/11/2017 -- she has been unable to use a wound VAC regularly because she loses this evening and it has not been possible for her to use it. 07/18/2017 -- she is much more comfortable and looks better without the wound VAC and at this stage I believe it will be better for her to return the machine and continue with local care. 08/07/2017 -- she is back up to 3 weeks due to a vacation and during this time her wound VAC was returned. She has been doing local dressings Objective Constitutional Pulse regular. Respirations normal and unlabored. Afebrile. Vitals Time Taken: 3:11 PM, Height: 67 in, Weight: 137 lbs, BMI: 21.5,  Blood Pressure: 82/52 mmHg. General Notes: BP is normal for pt. Eyes Nonicteric. Reactive to light. Ears, Nose, Mouth, and Throat Lips, teeth, and gums WNL.Marland Kitchen Moist mucosa without lesions. Neck supple and nontender. No palpable supraclavicular or cervical adenopathy. Normal sized without goiter. Respiratory WNL. No retractions.. Cardiovascular Bezanson, Brinna S. ([id number]) Pedal Pulses WNL. No clubbing, cyanosis or edema. Lymphatic No adneopathy. No adenopathy. No adenopathy. Musculoskeletal Adexa without tenderness or enlargement.. Digits and nails w/o clubbing, cyanosis,  infection, petechiae, ischemia, or inflammatory conditions.Marland Kitchen Psychiatric Judgement and insight Intact.. No evidence of depression, anxiety, or agitation.. General Notes: the patient's right lateral ankle wound is looking clean and no sharp debridement was required. We will continue with silver alginate locally. The wound on her right gluteal area is not macerated and the depths of the wound is clean. I do not believe she needs a wound VAC any longer as it can be difficult to keep in place Integumentary (Hair, Skin) No suspicious lesions. No crepitus or fluctuance. No peri-wound warmth or erythema. No masses.. Wound #1 status is Open. Original cause of wound was Gradually Appeared. The wound is located on the Right,Lateral Malleolus. The wound measures 1.1cm length x 1.2cm width x 0.3cm depth; 1.037cm^2 area and 0.311cm^3 volume. There is Fat Layer (Subcutaneous Tissue) Exposed exposed. There is no tunneling or undermining noted. There is a large amount of serous drainage noted. The wound margin is distinct with the outline attached to the wound base. There is large (67-100%) pink granulation within the wound bed. There is a small (1-33%) amount of necrotic tissue within the wound bed including Adherent Slough. The periwound skin appearance exhibited: Induration, Maceration, Erythema. The periwound skin appearance did not exhibit: Callus, Crepitus, Excoriation, Rash, Scarring, Dry/Scaly, Atrophie Blanche, Cyanosis, Ecchymosis, Hemosiderin Staining, Mottled, Pallor, Rubor. The surrounding wound skin color is noted with erythema which is circumferential. Periwound temperature was noted as No Abnormality. The periwound has tenderness on palpation. Wound #2 status is Open. Original cause of wound was Gradually Appeared. The wound is located on the Right Gluteus. The wound measures 2.4cm length x 1cm width x 1.2cm depth; 1.885cm^2 area and 2.262cm^3 volume. There is Fat Layer (Subcutaneous Tissue)  Exposed exposed. There is no tunneling or undermining noted. There is a large amount of serous drainage noted. Foul odor after cleansing was noted. The wound margin is distinct with the outline attached to the wound base. There is large (67-100%) red, pink granulation within the wound bed. There is a small (1-33%) amount of necrotic tissue within the wound bed including Adherent Slough. The periwound skin appearance exhibited: Maceration, Ecchymosis. Periwound temperature was noted as No Abnormality. The periwound has tenderness on palpation. 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 Wound #2 Right Gluteus: Clean wound with Normal Saline. Anesthetic: Wound #2 Right Gluteus: Topical Lidocaine 4% cream applied to wound bed prior to debridement Primary Wound Dressing: Wound #1 Right,Lateral Malleolus: Aquacel Ag Wound #2 Right Gluteus: Aquacel Ag Secondary Dressing: Wound #1 Right,Lateral Malleolus: Boardered Foam Dressing Wound #2 Right Gluteus: Boardered Foam Dressing Dressing Change Frequency: Wound #  1 Right,Lateral Malleolus: Change Dressing Monday, Wednesday, Friday Wound #2 Right Gluteus: Change Dressing Monday, Wednesday, Friday Follow-up Appointments: Wound #1 Right,Lateral Malleolus: Return Appointment in: - 3 weeks Wound #2 Right Gluteus: Return Appointment in: - 3 weeks Off-Loading: Wound #1 Right,Lateral Malleolus: Turn and reposition every 2 hours Wound  #2 Right Gluteus: Turn and reposition every 2 hours Home Health: Wound #1 Right,Lateral Malleolus: [place] Visits - Monday, Wednesday, Friday GLORIANNA, GOTT ([id number]) [place] Nurse may visit PRN to address patient s wound care needs. FACE TO FACE ENCOUNTER: MEDICARE and MEDICAID PATIENTS: I certify that this patient is under my care and that I had a face-to-face encounter that meets the physician face-to-face encounter requirements with this patient on this date. The encounter with the patient was in whole or in part for the following MEDICAL CONDITION: (primary reason for [place]) MEDICAL NECESSITY: I certify, that based on my findings, NURSING services are a medically necessary home health service. HOME BOUND STATUS: I certify that my clinical findings support that this patient is homebound (i.e., Due to illness or injury, pt requires aid of supportive devices such as crutches, cane, wheelchairs, walkers, the use of special transportation or the assistance of another person to leave their place of residence. There is a normal inability to leave the home and doing so requires considerable and taxing effort. Other absences are for medical reasons / religious services and are infrequent or of short duration when for other reasons). If current dressing causes regression in wound condition, may D/C ordered dressing product/s and apply Normal Saline Moist Dressing daily until next [place] / Other MD appointment. [place] of regression in wound condition at [phone]. Please direct any NON-WOUND related issues/requests for orders to patient's Primary Care Physician Wound #2 Right Gluteus: [place] Visits - Monday, Wednesday, Friday Home Health Nurse may visit PRN to address patient s wound care needs. FACE TO FACE ENCOUNTER: MEDICARE and MEDICAID PATIENTS: I certify that this patient is under my care and that I had a  face-to-face encounter that meets the physician face-to-face encounter requirements with this patient on this date. The encounter with the patient was in whole or in part for the following MEDICAL CONDITION: (primary reason for [place]) MEDICAL NECESSITY: I certify, that based on my findings, NURSING services are a medically necessary home health service. HOME BOUND STATUS: I certify that my clinical findings support that this patient is homebound (i.e., Due to illness or injury, pt requires aid of supportive devices such as crutches, cane, wheelchairs, walkers, the use of special transportation or the assistance of another person to leave their place of residence. There is a normal inability to leave the home and doing so requires considerable and taxing effort. Other absences are for medical reasons / religious services and are infrequent or of short duration when for other reasons). If current dressing causes regression in wound condition, may D/C ordered dressing product/s and apply Normal Saline Moist Dressing daily until next [place] / Other MD appointment. [place] of regression in wound condition at [phone]. Please direct any NON-WOUND related issues/requests for orders to patient's Primary Care Physician After review today I have recommended: 1. Silver alginate to be applied to the right ankle, changed every other day 2. Silver alginate with a gauze bolster to the right gluteal region to be changed 3 times a week. 3. adequate protein, vitamin A, vitamin C and zinc 4. return  to see [place] next week. Electronic Signature(s) Signed: 08/08/2017 4:09:30 PM By: Christin Fudge MD, FACS Entered By: Christin Fudge on 08/08/2017 15:40:37 Locy, JERRY HAUGEN ([id number]) [place], Meara Chauncey Cruel ([id number]) -------------------------------------------------------------------------------- SuperBill Details Patient Name: [name], [name]. Date of Service:  08/08/2017 Medical Record Number: [id number] Patient Account Number: [id number] Date of Birth/Sex: [birth year] (51 y.o. 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, right ankle and foot Facility Procedures CPT4 Code: [id number] Description: 99214 - WOUND CARE VISIT-LEV 4 EST PT Modifier: Quantity: 1 Physician Procedures CPT4 Code Description: [id number] - WC PHYS LEVEL 3 - EST PT ICD-10 Description Diagnosis E11.621 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

## 2017-08-11 ENCOUNTER — Ambulatory Visit (HOSPITAL_COMMUNITY)
Admission: RE | Admit: 2017-08-11 | Discharge: 2017-08-11 | Disposition: A | Discharge status: Home / Self Care | Payer: Medicare Other | Source: Ambulatory Visit | Attending: Surgery | Admitting: Surgery

## 2017-08-11 ENCOUNTER — Encounter: Payer: Self-pay | Admitting: Surgery

## 2017-08-11 ENCOUNTER — Ambulatory Visit
Admission: RE | Admit: 2017-08-11 | Discharge: 2017-08-11 | Disposition: A | Discharge status: Home / Self Care | Payer: Medicare Other | Source: Ambulatory Visit | Attending: Surgery | Admitting: Surgery

## 2017-08-11 ENCOUNTER — Ambulatory Visit (INDEPENDENT_AMBULATORY_CARE_PROVIDER_SITE_OTHER): Payer: Medicare Other | Admitting: Surgery

## 2017-08-11 VITALS — BP 107/63 | HR 77 | Temp 97.8°F | Resp 18 | Ht 67.0 in | Wt 159.0 lb

## 2017-08-11 DIAGNOSIS — I6523 Occlusion and stenosis of bilateral carotid arteries: Secondary | ICD-10-CM

## 2017-08-11 DIAGNOSIS — I7025 Atherosclerosis of native arteries of other extremities with ulceration: Secondary | ICD-10-CM | POA: Diagnosis not present

## 2017-08-11 LAB — VAS US CAROTID
LCCAPSYS: 128 cm/s
LEFT ECA DIAS: -52 cm/s
LICAPSYS: 186 cm/s
Left CCA dist dias: -51 cm/s
Left CCA dist sys: -150 cm/s
Left CCA prox dias: 27 cm/s
Left ICA dist dias: -37 cm/s
Left ICA dist sys: -103 cm/s
Left ICA prox dias: 72 cm/s
RCCADSYS: -77 cm/s
RCCAPDIAS: 19 cm/s
RIGHT CCA MID DIAS: 34 cm/s
RIGHT ECA DIAS: -35 cm/s
Right CCA prox sys: 166 cm/s

## 2017-08-11 MED ORDER — IOPAMIDOL (ISOVUE-370) INJECTION 76%
125.0000 mL | Freq: Once | INTRAVENOUS | Status: AC | PRN
  Administered 2017-08-11: 125 mL via INTRAVENOUS

## 2017-08-11 NOTE — Progress Notes (Signed)
SAIYA, CRIST ([id number]) Visit Report for 08/08/2017 Arrival Information Details Patient Name: [name], [name]. Date of Service: 08/08/2017 2:30 PM Medical Record Number: [id number] Patient Account Number: [id number] Date of Birth/Sex: Sep 22, 1966 (51 y.o. Female) Treating RN: Carolyne Fiscal, Debi Primary Care Ky Rumple: Delight Stare Other Clinician: Referring Jashay Roddy: Delight Stare Treating Chaise Passarella/Extender: Frann Rider in Treatment: 28 Visit Information History Since Last Visit All ordered tests and consults were completed: No Patient Arrived: Walker Added or deleted any medications: No Arrival Time: 15:10 Any new allergies or adverse reactions: No Accompanied By: self Had a fall or experienced change in No Transfer Assistance: None activities of daily living that may affect Patient Identification Verified: Yes risk of falls: Secondary Verification Process Completed: Yes Signs or symptoms of abuse/neglect since last No Patient Requires Transmission-Based No visito Precautions: Hospitalized since last visit: No Patient Has Alerts: No Has Dressing in Place as Prescribed: Yes Pain Present Now: No Electronic Signature(s) Signed: 08/08/2017 5:11:04 PM By: Alric Quan Entered By: Alric Quan on 08/08/2017 15:11:07 [name], [name] ([id number]) -------------------------------------------------------------------------------- Clinic Level of Care Assessment Details Patient Name: Jamie Martyr. Date of Service: 08/08/2017 2:30 PM Medical Record Number: [id number] Patient Account Number: [id number] Date of Birth/Sex: 08/30/1966 (51 y.o. Female) Treating RN: Carolyne Fiscal, Debi Primary Care Dimitrius Steedman: Delight Stare Other Clinician: Referring Noele Icenhour: Delight Stare Treating Julietta Batterman/Extender: Frann Rider in Treatment: 28 Clinic Level of Care Assessment Items TOOL 4 Quantity Score X - Use when only an EandM is performed on FOLLOW-UP visit 1 0 ASSESSMENTS - Nursing  Assessment / Reassessment X - Reassessment of Co-morbidities (includes updates in patient status) 1 10 X - Reassessment of Adherence to Treatment Plan 1 5 ASSESSMENTS - Wound and Skin Assessment / Reassessment []  - Simple Wound Assessment / Reassessment - one wound 0 X - Complex Wound Assessment / Reassessment - multiple wounds 2 5 []  - Dermatologic / Skin Assessment (not related to wound area) 0 ASSESSMENTS - Focused Assessment []  - Circumferential Edema Measurements - multi extremities 0 []  - Nutritional Assessment / Counseling / Intervention 0 []  - Lower Extremity Assessment (monofilament, tuning fork, pulses) 0 []  - Peripheral Arterial Disease Assessment (using hand held doppler) 0 ASSESSMENTS - Ostomy and/or Continence Assessment and Care []  - Incontinence Assessment and Management 0 []  - Ostomy Care Assessment and Management (repouching, etc.) 0 PROCESS - Coordination of Care []  - Simple Patient / Family Education for ongoing care 0 X - Complex (extensive) Patient / Family Education for ongoing care 1 20 X - Staff obtains Programmer, systems, Records, Test Results / Process Orders 1 10 X - Staff telephones HHA, Nursing Homes / Clarify orders / etc 1 10 []  - Routine Transfer to another Facility (non-emergent condition) 0 [name], [name] ([id number]) []  - Routine Hospital Admission (non-emergent condition) 0 []  - New Admissions / Biomedical engineer / Ordering NPWT, Apligraf, etc. 0 []  - Emergency Hospital Admission (emergent condition) 0 X - Simple Discharge Coordination 1 10 []  - Complex (extensive) Discharge Coordination 0 PROCESS - Special Needs []  - Pediatric / Minor Patient Management 0 []  - Isolation Patient Management 0 []  - Hearing / Language / Visual special needs 0 []  - Assessment of Community assistance (transportation, D/C planning, etc.) 0 []  - Additional assistance / Altered mentation 0 []  - Support Surface(s) Assessment (bed, cushion, seat, etc.) 0 INTERVENTIONS -  Wound Cleansing / Measurement []  - Simple Wound Cleansing - one wound 0 X - Complex Wound Cleansing - multiple wounds 2 5 X - Wound Imaging (  photographs - any number of wounds) 1 5 []  - Wound Tracing (instead of photographs) 0 []  - Simple Wound Measurement - one wound 0 X - Complex Wound Measurement - multiple wounds 2 5 INTERVENTIONS - Wound Dressings X - Small Wound Dressing one or multiple wounds 2 10 []  - Medium Wound Dressing one or multiple wounds 0 []  - Large Wound Dressing one or multiple wounds 0 X - Application of Medications - topical 1 5 []  - Application of Medications - injection 0 INTERVENTIONS - Miscellaneous []  - External ear exam 0 [name], [name] ([id number]) []  - Specimen Collection (cultures, biopsies, blood, body fluids, etc.) 0 []  - Specimen(s) / Culture(s) sent or taken to Lab for analysis 0 []  - Patient Transfer (multiple staff / Harrel Lemon Lift / Similar devices) 0 []  - Simple Staple / Suture removal (25 or less) 0 []  - Complex Staple / Suture removal (26 or more) 0 []  - Hypo / Hyperglycemic Management (close monitor of Blood Glucose) 0 []  - Ankle / Brachial Index (ABI) - do not check if billed separately 0 X - Vital Signs 1 5 Has the patient been seen at the hospital within the last three years: Yes Total Score: 130 Level Of Care: New/Established - Level 4 Electronic Signature(s) Signed: 08/08/2017 5:11:04 PM By: Alric Quan Entered By: Alric Quan on 08/08/2017 16:42:45 [name], [name] ([id number]) -------------------------------------------------------------------------------- Encounter Discharge Information Details Patient Name: Jamie Martyr. Date of Service: 08/08/2017 2:30 PM Medical Record Number: [id number] Patient Account Number: [id number] Date of Birth/Sex: 02-26-1966 (51 y.o. Female) Treating RN: Carolyne Fiscal, Debi Primary Care Pearlie Lafosse: Delight Stare Other Clinician: Referring Darsha Zumstein: Delight Stare Treating Shirline Kendle/Extender: Frann Rider in Treatment: 28 Encounter Discharge Information Items Discharge Pain Level: 0 Discharge Condition: Stable Ambulatory Status: Walker Discharge Destination: Home Transportation: Private Auto Accompanied By: self Schedule Follow-up Appointment: Yes Medication Reconciliation completed No and provided to Patient/Care Annalisia Ingber: Provided on Clinical Summary of Care: 08/08/2017 Form Type Recipient Paper Patient RM Electronic Signature(s) Signed: 08/11/2017 9:26:52 AM By: Ruthine Dose Entered By: Ruthine Dose on 08/08/2017 15:44:34 Mulhearn, Berlynn S. ([id number]) -------------------------------------------------------------------------------- Lower Extremity Assessment Details Patient Name: Jamie Martyr. Date of Service: 08/08/2017 2:30 PM Medical Record Number: [id number] Patient Account Number: [id number] Date of Birth/Sex: 07-02-1966 (51 y.o. Female) Treating RN: Carolyne Fiscal, Debi Primary Care Lexxie Winberg: Delight Stare Other Clinician: Referring Jovane Foutz: Delight Stare Treating Wenceslao Loper/Extender: Frann Rider in Treatment: 28 Vascular Assessment Pulses: Dorsalis Pedis Palpable: [Right:Yes] Posterior Tibial Extremity colors, hair growth, and conditions: Extremity Color: [Right:Normal] Hair Growth on Extremity: [Right:No] Temperature of Extremity: [Right:Warm] Capillary Refill: [Right:< 3 seconds] Electronic Signature(s) Signed: 08/08/2017 5:11:04 PM By: Alric Quan Entered By: Alric Quan on 08/08/2017 15:21:45 [name], [name] ([id number]) -------------------------------------------------------------------------------- Multi Wound Chart Details Patient Name: Jamie Martyr. Date of Service: 08/08/2017 2:30 PM Medical Record Number: [id number] Patient Account Number: [id number] Date of Birth/Sex: Mar 30, 1966 (51 y.o. Female) Treating RN: Carolyne Fiscal, Debi Primary Care Dave Mergen: Delight Stare Other Clinician: Referring Vanita Cannell: Delight Stare Treating  Christophere Hillhouse/Extender: Frann Rider in Treatment: 28 Vital Signs Height(in): 67 Pulse(bpm): Weight(lbs): 137 Blood Pressure 82/52 (mmHg): Body Mass Index(BMI): 21 Temperature(F): Respiratory Rate (breaths/min): Photos: [1:No Photos] [2:No Photos] [N/A:N/A] Wound Location: [1:Right Malleolus - Lateral] [2:Right Gluteus] [N/A:N/A] Wounding Event: [1:Gradually Appeared] [2:Gradually Appeared] [N/A:N/A] Primary Etiology: [1:Diabetic Wound/Ulcer of the Lower Extremity] [2:Open Surgical Wound] [N/A:N/A] Comorbid History: [1:Anemia, Arrhythmia, Coronary Artery Disease, Hypotension, Peripheral Arterial Disease, Type II Diabetes, Osteoarthritis] [2:Anemia, Arrhythmia, Coronary Artery Disease, Hypotension, Peripheral Arterial Disease, Type II  Diabetes,  Osteoarthritis] [N/A:N/A] Date Acquired: [1:09/23/2016] [2:09/18/2016] [N/A:N/A] Weeks of Treatment: [1:28] [2:28] [N/A:N/A] Wound Status: [1:Open] [2:Open] [N/A:N/A] Measurements L x W x D 1.1x1.2x0.3 [2:2.4x1x1.2] [N/A:N/A] (cm) Area (cm) : [1:1.037] [2:1.885] [N/A:N/A] Volume (cm) : [1:0.311] [2:2.262] [N/A:N/A] % Reduction in Area: [1:73.60%] [2:90.20%] [N/A:N/A] % Reduction in Volume: 84.20% [2:97.60%] [N/A:N/A] Classification: [1:Grade 2] [2:Full Thickness Without Exposed Support Structures] [N/A:N/A] Exudate Amount: [1:Large] [2:Large] [N/A:N/A] Exudate Type: [1:Serous] [2:Serous] [N/A:N/A] Exudate Color: [1:amber] [2:amber] [N/A:N/A] Foul Odor After [1:No] [2:Yes] [N/A:N/A] Cleansing: Odor Anticipated Due to [1:N/A] [2:No] [N/A:N/A] Product Use: Wound Margin: [1:Distinct, outline attached Distinct, outline attached N/A] Granulation Amount: Large (67-100%) Large (67-100%) N/A Granulation Quality: Pink Red, Pink N/A Necrotic Amount: Small (1-33%) Small (1-33%) N/A Exposed Structures: Fat Layer (Subcutaneous Fat Layer (Subcutaneous N/A Tissue) Exposed: Yes Tissue) Exposed: Yes Fascia: No Fascia: No Tendon: No Tendon:  No Muscle: No Muscle: No Joint: No Joint: No Bone: No Bone: No Epithelialization: None None N/A Periwound Skin Texture: Induration: Yes No Abnormalities Noted N/A Excoriation: No Callus: No Crepitus: No Rash: No Scarring: No Periwound Skin Maceration: Yes Maceration: Yes N/A Moisture: Dry/Scaly: No Periwound Skin Color: Erythema: Yes Ecchymosis: Yes N/A Atrophie Blanche: No Cyanosis: No Ecchymosis: No Hemosiderin Staining: No Mottled: No Pallor: No Rubor: No Erythema Location: Circumferential N/A N/A Temperature: No Abnormality No Abnormality N/A Tenderness on Yes Yes N/A Palpation: Wound Preparation: Ulcer Cleansing: Ulcer Cleansing: N/A Rinsed/Irrigated with Rinsed/Irrigated with Saline Saline Topical Anesthetic Topical Anesthetic Applied: Other: lidocaine Applied: Other: lidocaine 4% 4% Treatment Notes Electronic Signature(s) Signed: 08/08/2017 4:09:30 PM By: Christin Fudge MD, FACS Entered By: Christin Fudge on 08/08/2017 15:37:27 [name], [name] ([id number]) -------------------------------------------------------------------------------- [place] Details Patient Name: [name], BILGER. Date of Service: 08/08/2017 2:30 PM Medical Record Number: [id number] Patient Account Number: [id number] Date of Birth/Sex: 1966-04-26 (51 y.o. Female) Treating RN: Carolyne Fiscal, Debi Primary Care Tillman Kazmierski: Delight Stare Other Clinician: Referring Jakaree Pickard: Delight Stare Treating Abbegayle Denault/Extender: Frann Rider in Treatment: 28 Active Inactive ` Orientation to the Wound Care Program Nursing Diagnoses: Knowledge deficit related to the wound healing center program Goals: Patient/caregiver will verbalize understanding of the [facility] Program Date Initiated: 01/23/2017 Target Resolution Date: 05/25/2017 Goal Status: Active Interventions: Provide education on orientation to the wound center Notes: ` Peripheral Neuropathy Nursing  Diagnoses: Knowledge deficit related to disease process and management of peripheral neurovascular dysfunction Potential alteration in peripheral tissue perfusion (select prior to confirmation of diagnosis) Goals: Patient/caregiver will verbalize understanding of disease process and disease management Date Initiated: 01/23/2017 Target Resolution Date: 05/25/2017 Goal Status: Active Interventions: Assess signs and symptoms of neuropathy upon admission and as needed Provide education on Management of Neuropathy and Related Ulcers Provide education on Management of Neuropathy upon discharge from the [facility] Activities: Test ordered outside of clinic : 01/23/2017 Notes: [name], [name] ([id number]) ` Pressure Nursing Diagnoses: Knowledge deficit related to causes and risk factors for pressure ulcer development Knowledge deficit related to management of pressures ulcers Potential for impaired tissue integrity related to pressure, friction, moisture, and shear Goals: Patient will remain free from development of additional pressure ulcers Date Initiated: 01/23/2017 Target Resolution Date: 05/25/2017 Goal Status: Active Patient will remain free of pressure ulcers Date Initiated: 01/23/2017 Target Resolution Date: 05/25/2017 Goal Status: Active Patient/caregiver will verbalize risk factors for pressure ulcer development Date Initiated: 01/23/2017 Target Resolution Date: 05/25/2017 Goal Status: Active Patient/caregiver will verbalize understanding of pressure ulcer management Date Initiated: 01/23/2017 Target Resolution Date: 05/25/2017 Goal Status: Active Interventions:  Assess: immobility, friction, shearing, incontinence upon admission and as needed Assess offloading mechanisms upon admission and as needed Assess potential for pressure ulcer upon admission and as needed Provide education on pressure ulcers Treatment Activities: Patient referred for pressure reduction/relief devices :  01/23/2017 Patient referred for seating evaluation to ensure proper offloading : 01/23/2017 Pressure reduction/relief device ordered : 01/23/2017 Notes: ` Wound/Skin Impairment Nursing Diagnoses: Impaired tissue integrity Knowledge deficit related to smoking impact on wound healing Knowledge deficit related to ulceration/compromised skin integrity Goals: Patient/caregiver will verbalize understanding of skin care regimen [name], [name] ([id number]) Date Initiated: 01/23/2017 Target Resolution Date: 05/25/2017 Goal Status: Active Ulcer/skin breakdown will have a volume reduction of 30% by week 4 Date Initiated: 01/23/2017 Target Resolution Date: 05/25/2017 Goal Status: Active Ulcer/skin breakdown will have a volume reduction of 50% by week 8 Date Initiated: 01/23/2017 Target Resolution Date: 05/25/2017 Goal Status: Active Ulcer/skin breakdown will have a volume reduction of 80% by week 12 Date Initiated: 01/23/2017 Target Resolution Date: 05/25/2017 Goal Status: Active Ulcer/skin breakdown will heal within 14 weeks Date Initiated: 01/23/2017 Target Resolution Date: 05/25/2017 Goal Status: Active Interventions: Assess patient/caregiver ability to obtain necessary supplies Assess patient/caregiver ability to perform ulcer/skin care regimen upon admission and as needed Assess ulceration(s) every visit Provide education on smoking Provide education on ulcer and skin care Treatment Activities: Patient referred to home care : 01/23/2017 Referred to DME Kahil Agner for dressing supplies : 01/23/2017 Skin care regimen initiated : 01/23/2017 Topical wound management initiated : 01/23/2017 Notes: Electronic Signature(s) Signed: 08/08/2017 5:11:04 PM By: Alric Quan Entered By: Alric Quan on 08/08/2017 15:30:23 [name], [name] ([id number]) -------------------------------------------------------------------------------- Pain Assessment Details Patient Name: Jamie Martyr. Date of Service: 08/08/2017  2:30 PM Medical Record Number: [id number] Patient Account Number: [id number] Date of Birth/Sex: [birth year] (51 y.o. Female) Treating RN: Carolyne Fiscal, Debi Primary Care Oria Klimas: Delight Stare Other Clinician: Referring Tiffany Talarico: Delight Stare Treating Isak Sotomayor/Extender: Frann Rider in Treatment: 28 Active Problems Location of Pain Severity and Description of Pain Patient Has Paino No Site Locations Pain Management and Medication Current Pain Management: Electronic Signature(s) Signed: 08/08/2017 5:11:04 PM By: Alric Quan Entered By: Alric Quan on 08/08/2017 15:11:12 [name], Jamie Mitchell. ([id number]) -------------------------------------------------------------------------------- Patient/Caregiver Education Details Patient Name: Jamie Martyr. Date of Service: 08/08/2017 2:30 PM Medical Record Number: [id number] Patient Account Number: [id number] Date of Birth/Gender: 29-Jul-1966 (51 y.o. Female) Treating RN: Carolyne Fiscal, Debi Primary Care Physician: Delight Stare Other Clinician: Referring Physician: Delight Stare Treating Physician/Extender: Frann Rider in Treatment: 72 Education Assessment Education Provided To: Patient Education Topics Provided Wound/Skin Impairment: Handouts: Other: change dressing as ordered Methods: Demonstration, Explain/Verbal Responses: State content correctly Electronic Signature(s) Signed: 08/08/2017 5:11:04 PM By: Alric Quan Entered By: Alric Quan on 08/08/2017 15:31:04 Lisby, Adya S. ([id number]) -------------------------------------------------------------------------------- Wound Assessment Details Patient Name: Jamie Martyr. Date of Service: 08/08/2017 2:30 PM Medical Record Number: [id number] Patient Account Number: [id number] Date of Birth/Sex: 06/29/1966 (51 y.o. Female) Treating RN: Carolyne Fiscal, Debi Primary Care Cleona Doubleday: Delight Stare Other Clinician: Referring Aquinnah Devin: Delight Stare Treating  Cyara Devoto/Extender: Frann Rider in Treatment: 28 Wound Status Wound Number: 1 Primary Diabetic Wound/Ulcer of the Lower Etiology: Extremity Wound Location: Right Malleolus - Lateral Wound Open Wounding Event: Gradually Appeared Status: Date Acquired: 09/23/2016 Comorbid Anemia, Arrhythmia, Coronary Artery Weeks Of Treatment: 28 History: Disease, Hypotension, Peripheral Clustered Wound: No Arterial Disease, Type II Diabetes, Osteoarthritis Photos Photo Uploaded By: Alric Quan on 08/11/2017 08:05:35 Wound Measurements Length: (cm) 1.1 Width: (cm) 1.2 Depth: (cm) 0.3 Area: (cm)  1.037 Volume: (cm) 0.311 % Reduction in Area: 73.6% % Reduction in Volume: 84.2% Epithelialization: None Tunneling: No Undermining: No Wound Description Classification: Grade 2 Foul Odor Aft Wound Margin: Distinct, outline attached Slough/Fibrin Exudate Amount: Large Exudate Type: Serous Exudate Color: amber er Cleansing: No o Yes Wound Bed Granulation Amount: Large (67-100%) Exposed Structure Granulation Quality: Pink Fascia Exposed: No Necrotic Amount: Small (1-33%) Fat Layer (Subcutaneous Tissue) Exposed: Yes Wimer, Lilibeth S. ([id number]) Necrotic Quality: Adherent Slough Tendon Exposed: No Muscle Exposed: No Joint Exposed: No Bone Exposed: No Periwound Skin Texture Texture Color No Abnormalities Noted: No No Abnormalities Noted: No Callus: No Atrophie Blanche: No Crepitus: No Cyanosis: No Excoriation: No Ecchymosis: No Induration: Yes Erythema: Yes Rash: No Erythema Location: Circumferential Scarring: No Hemosiderin Staining: No Mottled: No Moisture Pallor: No No Abnormalities Noted: No Rubor: No Dry / Scaly: No Maceration: Yes Temperature / Pain Temperature: No Abnormality Tenderness on Palpation: Yes Wound Preparation Ulcer Cleansing: Rinsed/Irrigated with Saline Topical Anesthetic Applied: Other: lidocaine 4%, Treatment Notes Wound #1 (Right,  Lateral Malleolus) 1. Cleansed with: Clean wound with Normal Saline 2. Anesthetic Topical Lidocaine 4% cream to wound bed prior to debridement 3. Peri-wound Care: Skin Prep 4. Dressing Applied: Aquacel Ag 5. Secondary Dressing Applied Bordered Foam Dressing Dry Gauze Electronic Signature(s) Signed: 08/08/2017 5:11:04 PM By: Alric Quan Entered By: Alric Quan on 08/08/2017 15:16:47 Sweetser, Haileyann S. ([id number]) -------------------------------------------------------------------------------- Wound Assessment Details Patient Name: Jamie Martyr. Date of Service: 08/08/2017 2:30 PM Medical Record Number: [id number] Patient Account Number: [id number] Date of Birth/Sex: 10-13-1966 (51 y.o. Female) Treating RN: Carolyne Fiscal, Debi Primary Care Braylynn Ghan: Delight Stare Other Clinician: Referring Jarron Curley: Delight Stare Treating Macey Wurtz/Extender: Frann Rider in Treatment: 28 Wound Status Wound Number: 2 Primary Open Surgical Wound Etiology: Wound Location: Right Gluteus Wound Open Wounding Event: Gradually Appeared Status: Date Acquired: 09/18/2016 Comorbid Anemia, Arrhythmia, Coronary Artery Weeks Of Treatment: 28 History: Disease, Hypotension, Peripheral Clustered Wound: No Arterial Disease, Type II Diabetes, Osteoarthritis Photos Photo Uploaded By: Alric Quan on 08/11/2017 08:05:35 Wound Measurements Length: (cm) 2.4 Width: (cm) 1 Depth: (cm) 1.2 Area: (cm) 1.885 Volume: (cm) 2.262 % Reduction in Area: 90.2% % Reduction in Volume: 97.6% Epithelialization: None Tunneling: No Undermining: No Wound Description Full Thickness Without Exposed Classification: Support Structures Wound Margin: Distinct, outline attached Exudate Large Amount: Exudate Type: Serous Exudate Color: amber Foul Odor After Cleansing: Yes Due to Product Use: No Slough/Fibrino Yes Wound Bed Granulation Amount: Large (67-100%) Exposed Structure Speich, Ahsha S.  ([id number]) Granulation Quality: Red, Pink Fascia Exposed: No Necrotic Amount: Small (1-33%) Fat Layer (Subcutaneous Tissue) Exposed: Yes Necrotic Quality: Adherent Slough Tendon Exposed: No Muscle Exposed: No Joint Exposed: No Bone Exposed: No Periwound Skin Texture Texture Color No Abnormalities Noted: No No Abnormalities Noted: No Ecchymosis: Yes Moisture No Abnormalities Noted: No Temperature / Pain Maceration: Yes Temperature: No Abnormality Tenderness on Palpation: Yes Wound Preparation Ulcer Cleansing: Rinsed/Irrigated with Saline Topical Anesthetic Applied: Other: lidocaine 4%, Treatment Notes Wound #2 (Right Gluteus) 1. Cleansed with: Clean wound with Normal Saline 2. Anesthetic Topical Lidocaine 4% cream to wound bed prior to debridement 3. Peri-wound Care: Skin Prep 4. Dressing Applied: Aquacel Ag 5. Secondary Dressing Applied Bordered Foam Dressing Dry Gauze Electronic Signature(s) Signed: 08/08/2017 5:11:04 PM By: Alric Quan Entered By: Alric Quan on 08/08/2017 15:19:48 Devins, Dmya S. ([id number]) -------------------------------------------------------------------------------- [place] Details Patient Name: Jamie Martyr. Date of Service: 08/08/2017 2:30 PM Medical Record Number: [id number] Patient Account Number: [id number] Date of Birth/Sex: [birth year] (51 y.o. Female)  Treating RN: Ahmed Prima Primary Care Elaine Roanhorse: Delight Stare Other Clinician: Referring Sharonna Vinje: Delight Stare Treating Riddick Nuon/Extender: Frann Rider in Treatment: 28 Vital Signs Time Taken: 15:11 Blood Pressure (mmHg): 82/52 Height (in): 67 Reference Range: 80 - 120 mg / dl Weight (lbs): 137 Body Mass Index (BMI): 21.5 Notes BP is normal for pt. Electronic Signature(s) Signed: 08/08/2017 5:11:04 PM By: Alric Quan Entered By: Alric Quan on 08/08/2017 15:11:35

## 2017-08-11 NOTE — Progress Notes (Addendum)
HISTORY AND PHYSICAL     CC:  Follow up Requesting Provider:  [name], MD  HPI: This is a 51 y.o. female who is s/p bilateral femoral to popliteal bypass grafts for non healing ulcers.  She has had multiple interventions for recurrent stenosis.  She has also undergone balloon angioplasty of her right subclavian artery for upper extremity numbness.  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  She continues to be treated for this by the wound care center.  She states that her wound vac was recently removed.  She states that Dr. Con Memos does not feel this is infected.  She states that it has gotten a little bit better.  While in the hospital in February 2018 she had a wound on her right foot. 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 She is not a candidate for percutaneous intervention. She needs surgical revascularization but because of her infectious issues this has been delayed.  She does still have a wound on her lateral right posterior foot and the dressing is being changed three times/week.    She states that she recently fell while camping and has pain across her lower back.  She states that she has not had any temporary blindness in her left eye (she is blind in her left eye).  She states that her vision in the right eye is blurry and she needs to return to the eye doctor.  She denies any unilateral weakness or numbness.  She states that her right leg is better that it was before her procedure, but when she gets tired, it does drag some.  She is on a statin for cholesterol management.  She is on insulin for diabetes.  She is on Lyrica for her neuropathy.  She is on a daily aspirin.  She does not have hypertension.  Past Medical History:  Diagnosis Date  . Arthritis   . Asthma   . Chronic back pain   . Depression   . Diabetes mellitus   . Diabetic neuropathy ([place])   . Family history of adverse reaction  to anesthesia    mother had difficlty waking   . GERD (gastroesophageal reflux disease)   . Hyperlipidemia   . Joint pain   . Leg pain    With Walking  . 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 PAD (peripheral artery disease) ([place])   . Reflux   . Ulcer    Foot    Past Surgical History:  Procedure Laterality Date  . ABDOMINAL AORTAGRAM  June 15, 2014  . ABDOMINAL AORTAGRAM N/A 06/15/2014   Procedure: ABDOMINAL Maxcine Ham;  Surgeon: Serafina Mitchell, MD;  Location: [facility] CATH LAB;  Service: Cardiovascular;  Laterality: N/A;  . ABDOMINAL AORTAGRAM N/A 11/22/2014   Procedure: ABDOMINAL AORTAGRAM;  Surgeon: Serafina Mitchell, MD;  Location: [facility] CATH LAB;  Service: Cardiovascular;  Laterality: N/A;  . ABDOMINAL AORTOGRAM W/LOWER EXTREMITY N/A 01/07/2017   Procedure: Abdominal Aortogram w/Lower Extremity;  Surgeon: Serafina Mitchell, MD;  Location: [place] [facility];  Service: Cardiovascular;  Laterality: N/A;  . ARTERIAL BYPASS SURGRY  07/05/2010   Right Common Femoral to below knee popliteal BPG  . BACK SURGERY     X's  2  . CARDIAC CATHETERIZATION    . CHOLECYSTECTOMY     Gall Bladder  . CYSTECTOMY Right    foot  . CYSTECTOMY Left    wrist  . INTERCOSTAL NERVE  BLOCK  November 2015  . IR FLUORO GUIDE CV LINE RIGHT  03/20/2017  . IR US GUIDE VASC ACCESS RIGHT  03/20/2017  . IRRIGATION AND DEBRIDEMENT BUTTOCKS Right 09/30/2016   Procedure: DEBRIDEMENT RIGHT  BUTTOCK WOUND;  Surgeon: Georganna Skeans, MD;  Location: [place];  Service: General;  Laterality: Right;  . left foot surgery    . left wrist cyst removal Left   . PERIPHERAL VASCULAR CATHETERIZATION N/A 05/07/2016   Procedure: Abdominal Aortogram;  Surgeon: Serafina Mitchell, MD;  Location: [place] [facility] LAB;  Service: Cardiovascular;  Laterality: N/A;  . PERIPHERAL VASCULAR CATHETERIZATION N/A 05/07/2016   Procedure: Lower Extremity Angiography;  Surgeon: Serafina Mitchell, MD;  Location: [place] [facility];  Service: Cardiovascular;   Laterality: N/A;  . PERIPHERAL VASCULAR CATHETERIZATION N/A 05/07/2016   Procedure: Aortic Arch Angiography;  Surgeon: Serafina Mitchell, MD;  Location: [place] [facility] LAB;  Service: Cardiovascular;  Laterality: N/A;  . PERIPHERAL VASCULAR CATHETERIZATION N/A 05/07/2016   Procedure: Upper Extremity Angiography;  Surgeon: Serafina Mitchell, MD;  Location: [place] [facility] LAB;  Service: Cardiovascular;  Laterality: N/A;  . PERIPHERAL VASCULAR CATHETERIZATION Right 05/07/2016   Procedure: Peripheral Vascular Balloon Angioplasty;  Surgeon: Serafina Mitchell, MD;  Location: [place] [facility];  Service: Cardiovascular;  Laterality: Right;  subclavian  . PERIPHERAL VASCULAR CATHETERIZATION Right 05/07/2016   Procedure: Peripheral Vascular Intervention;  Surgeon: Serafina Mitchell, MD;  Location: [place] [facility];  Service: Cardiovascular;  Laterality: Right;  External  Iliac  . SKIN GRAFT Right 2012   [name] by Dr. Nils Pyle- Right and Left Ankle  . SPINE SURGERY    . TONSILLECTOMY      Allergies  Allergen Reactions  . Penicillins Other (See Comments)    Severe Headache (not so sure about this now) Has patient had a PCN reaction causing immediate rash, facial/tongue/throat swelling, SOB or lightheadedness with hypotension: No Has patient had a PCN reaction causing severe rash involving mucus membranes or skin necrosis: No Has patient had a PCN reaction that required hospitalization: No Has patient had a PCN reaction occurring within the last 10 years: No If all of the above answers are "NO", then may proceed with Cephalosporin use.     Current Outpatient Prescriptions  Medication Sig Dispense Refill  . albuterol (PROVENTIL) 2 MG tablet Take 2 mg by mouth 3 (three) times daily as needed for shortness of breath.     Marland Kitchen aspirin 81 MG tablet Take 81 mg by mouth daily.     Marland Kitchen atorvastatin (LIPITOR) 40 MG tablet Take 40 mg by mouth daily.    . cetirizine (ZYRTEC) 10 MG tablet Take 10 mg by mouth daily as needed  for allergies. Reported on 03/27/2016    . cyclobenzaprine (FLEXERIL) 10 MG tablet Limit 1 tablet by mouth 1  to  3 times per day if tolerated (Patient taking differently: Take 10 mg by mouth 3 (three) times daily as needed for muscle spasms. ) 90 tablet 0  . furosemide (LASIX) 40 MG tablet Take 40 mg by mouth 2 (two) times daily as needed for fluid.     Marland Kitchen HUMALOG KWIKPEN 100 UNIT/ML KiwkPen Inject 10 Units into the skin 3 (three) times daily. Per sliding scale    . HYDROcodone-acetaminophen (NORCO) 10-325 MG tablet Limit 1 tablet by mouth per day or twice per day if tolerated (Patient taking differently: Take 1 tablet by mouth See admin instructions. Every 8-12 hours) 60 tablet 0  .  LANTUS SOLOSTAR 100 UNIT/ML Solostar Pen Inject 36 Units into the skin 2 (two) times daily.     Marland Kitchen LORazepam (ATIVAN) 0.5 MG tablet Take 1 tablet 1 hour prior to PICC insertion.  May take 2nd pill at hospital if needed. 2 tablet 0  . meclizine (ANTIVERT) 25 MG tablet Take 25 mg by mouth 3 (three) times daily as needed for dizziness.    . midodrine (PROAMATINE) 10 MG tablet Take 0.5 tablets (5 mg total) by mouth 3 (three) times daily as needed (lightheadedness and/or low blood pressure). 90 tablet 0  . omeprazole (PRILOSEC) 40 MG capsule Take 40 mg by mouth daily.    . ondansetron (ZOFRAN ODT) 4 MG disintegrating tablet Take 1 tablet (4 mg total) by mouth every 8 (eight) hours as needed for nausea or vomiting. 20 tablet 0  . pregabalin (LYRICA) 100 MG capsule Limit 1 tab by mouth twice a day to 3 times a day if tolerated (Patient taking differently: Take 100 mg by mouth 3 (three) times daily. ) 90 capsule 0  . pseudoephedrine-guaifenesin (MUCINEX D) 60-600 MG per tablet Take 1 tablet by mouth every 12 (twelve) hours as needed for congestion. Reported on 04/08/2016    . sulfamethoxazole-trimethoprim (BACTRIM DS,SEPTRA DS) 800-160 MG tablet Take 2 tablets by mouth 2 (two) times daily. 120 tablet 0  . dicyclomine (BENTYL) 10 MG  capsule Take 10 mg by mouth 4 (four) times daily as needed for spasms.     No current facility-administered medications for this visit.    Facility-Administered Medications Ordered in Other Visits  Medication Dose Route Frequency Provider Last Rate Last Dose  . 0.9 %  sodium chloride infusion   Intravenous Continuous Serafina Mitchell, MD        Family History  Problem Relation Age of Onset  . Coronary artery disease Mother   . Peripheral vascular disease Mother   . Heart disease Mother        Before age 94  . Other Mother        Venous insuffiency  . Diabetes Mother   . Hyperlipidemia Mother   . Hypertension Mother   . Varicose Veins Mother   . Heart attack Mother        before age 51  . Heart disease Father   . Diabetes Father   . Diabetes Maternal Grandmother   . Diabetes Paternal Grandmother   . Diabetes Paternal Grandfather   . Diabetes Sister   . Hypertension Sister   . Diabetes Brother   . Hypertension Brother     Social History   Social History  . Marital status: Married    Spouse name: N/A  . Number of children: N/A  . Years of education: N/A   Occupational History  . disabled    Social History Main Topics  . Smoking status: Former Smoker    Years: 30.00    Types: Cigarettes    Quit date: 02/07/2017  . Smokeless tobacco: Never Used  . Alcohol use No  . Drug use: No  . Sexual activity: Yes    Birth control/ protection: Post-menopausal   Other Topics Concern  . Not on file   Social History Narrative  . No narrative on file     REVIEW OF SYSTEMS:   [X]  denotes positive finding, [ ]  denotes negative finding Cardiac  Comments:  Chest pain or chest pressure:    Shortness of breath upon exertion:    Short of breath when lying flat:  Irregular heart rhythm:        Vascular    Pain in calf, thigh, or hip brought on by ambulation: x   Pain in feet at night that wakes you up from your sleep:     Blood clot in your veins:    Leg swelling:           Pulmonary    Oxygen at home:    Productive cough:     Wheezing:         Neurologic    Sudden weakness in arms or legs:     Sudden numbness in arms or legs:     Sudden onset of difficulty speaking or slurred speech:    Temporary loss of vision in one eye:     Problems with dizziness:         Gastrointestinal    Blood in stool:     Vomited blood:         Genitourinary    Burning when urinating:     Blood in urine:        Psychiatric    Major depression:         Hematologic    Bleeding problems:    Problems with blood clotting too easily:        Skin    Rashes or ulcers:        Constitutional    Fever or chills:      PHYSICAL EXAMINATION:  Vitals:   08/11/17 1540  BP: 107/63  Pulse: 77  Resp: 18  Temp: 97.8 F (36.6 C)  SpO2: 100%   Vitals:   08/11/17 1540  Weight: 159 lb (72.1 kg)  Height: 5\' 7"  (1.702 m)   Body mass index is 24.9 kg/m.  General:  WDWN in NAD; vital signs documented above Gait: Not observed HENT: WNL, normocephalic Pulmonary: normal non-labored breathing , without Rales, rhonchi,  wheezing Cardiac: regular HR, without  Murmurs with left carotid bruit Skin: without rashes Vascular Exam/Pulses:  Right Left  DP Unable to palpate  Unable to palpate   PT Unable to palpate  Unable to palpate    Extremities: right leg with wound on lateral side of foot/leg with dressing in place. Musculoskeletal: no muscle wasting or atrophy  Neurologic: A&O X 3;  No focal weakness or paresthesias are detected Psychiatric:  The pt has Normal affect.   Non-Invasive Vascular Imaging:   Carotid duplex 08/11/17: -Doppler velocities suggest a 40-59% right & left proximal ICA stenosis that is borderline 60-79%.  (increase in left ICA stenosis category since study 01/16/17 but more consistent with study of 09/02/16). -bilateral ECA stenosis -left vertebral is antegrade proximally and appears occluded distally.   Pt meds includes: Statin:  Yes.    Beta Blocker:  No. Aspirin:  Yes.   ACEI:  No. ARB:  No. CCB use:  No Other Antiplatelet/Anticoagulant:  No    ASSESSMENT/PLAN:: 51 y.o. female with hx of bilateral femoral to popliteal bypass grafting with known occluded right iliac artery in need of aortobifemoral bypass grafting for non healing wound right foot.     -pt continues to have a sacral wound, which is concerning for putting in aortobifemoral bypass graft, however it is continuing to get smaller.  She is continuing to see Dr. Con Memos for sacral wound management.   -she did have a CTA today, which reveals that bilateral fem pop bypass grafts are open, but concerned about the right occluding given the right iliac artery occlusion.  Dr. Trula Slade is reluctant to proceed with left to right femoral to femoral bypass as her left iliac system is also diseased.   -per Cardiology:  She should continue to follow with Dr. Trula Slade as well as the wound care clinic and orthopedics. From a cardiac standpoint, Ms. Keach has not had any unstable symptoms with the exception of longstanding intermittent lightheadedness. It is difficult to accurately assess her blood pressure due to her bilateral subclavian artery disease. Right arm cuff pressure is low normal today. Cardiac workup this spring was notable for a stress test demonstrating inferolateral fixed defect. Subsequent echo showed preserved LVEF. EKG today does not show any inferior Q waves. I suspect that the fixed defect was most likely due to attenuation artifact. Additional cardiac testing or intervention at this time would not further mitigate her perioperative cardiovascular risk for any future vascular surgical procedures. Continued secondary prevention in the setting of advanced PVD is recommended, including aspirin and high intensity statin therapy. -Dr. Trula Slade will see her back in mid-November to determine progress of wound healing.  Continue current care. -carotid doppler with bilateral  proximal ICA stenosis that is 40-59% bordering 60-79% and the increase in the left is more consistent with the study she had in 09/02/16 than the previous in March 2018.  She is blind in the left eye and has only had blurriness in the right eye with no amaurosis fugax.  Will continue to monitor her carotid duplex in 6 months.    Leontine Locket, PA-C [facility] [phone]  Clinic MD:  Pt seen and examined with Dr. Trula Slade  I agree with the above.  She continues to have a sacral decubitus.  This does not appear to be infected.  She also has residual right leg ulcer.  She has CT scan today which shows that bilateral femoral-popliteal bypass grafts remained patent, however the right iliac artery is occluded.  She has a history of subclavian stenosis bilaterally.  I think the best option is an aortobifemoral bypass graft.  I have been reluctant to do this secondary to infectious issues.  However, we are getting to the point where need to proceed with surgery in order to prevent bypass graft failure and/or limb loss.  The patient can follow-up with me in one month.  I'm point to get cardiology clearance for an aortobifemoral bypass graft  [name]

## 2017-08-12 ENCOUNTER — Ambulatory Visit
Admission: RE | Admit: 2017-08-12 | Discharge: 2017-08-12 | Disposition: A | Discharge status: Home / Self Care | Payer: Medicare Other | Source: Ambulatory Visit | Attending: Infectious Disease | Admitting: Infectious Disease

## 2017-08-12 ENCOUNTER — Ambulatory Visit (INDEPENDENT_AMBULATORY_CARE_PROVIDER_SITE_OTHER): Payer: Medicare Other | Admitting: Infectious Disease

## 2017-08-12 ENCOUNTER — Encounter: Payer: Self-pay | Admitting: Infectious Disease

## 2017-08-12 VITALS — BP 98/63 | HR 83 | Temp 97.9°F | Wt 161.0 lb

## 2017-08-12 DIAGNOSIS — M726 Necrotizing fasciitis: Secondary | ICD-10-CM

## 2017-08-12 DIAGNOSIS — I739 Peripheral vascular disease, unspecified: Secondary | ICD-10-CM

## 2017-08-12 DIAGNOSIS — M86471 Chronic osteomyelitis with draining sinus, right ankle and foot: Secondary | ICD-10-CM

## 2017-08-12 DIAGNOSIS — I7092 Chronic total occlusion of artery of the extremities: Secondary | ICD-10-CM

## 2017-08-12 DIAGNOSIS — M86661 Other chronic osteomyelitis, right tibia and fibula: Secondary | ICD-10-CM | POA: Diagnosis not present

## 2017-08-12 DIAGNOSIS — IMO0001 Reserved for inherently not codable concepts without codable children: Secondary | ICD-10-CM

## 2017-08-12 DIAGNOSIS — Z23 Encounter for immunization: Secondary | ICD-10-CM | POA: Diagnosis not present

## 2017-08-12 DIAGNOSIS — Z794 Long term (current) use of insulin: Secondary | ICD-10-CM | POA: Diagnosis not present

## 2017-08-12 DIAGNOSIS — E119 Type 2 diabetes mellitus without complications: Secondary | ICD-10-CM

## 2017-08-12 NOTE — Progress Notes (Signed)
Subjective:    Cc: wound on ankle buttocks   Patient ID: [name], female    DOB: Mar 27, 1966, 51 y.o.   MRN: [id number]  HPI  51 y.o. female with  diabetic neuropathy diabetic foot ulcer peripheral vascular disease status post bypass surgery with evidence of osteomyelitis on MRI in the [facility] in February of 2018. She also has a sacral wound that is not healed up completely where she had prior necrotizing fasciitis.I saw her in the hospital and she did not want to undergo surgical intervention. She tells me that Dr Sharol Given is concerned that if he performs at biopsy or debridement on this call it will lead to her losing her leg. I have indeed concerned that she mailed to my need a below the knee or above the knee and dictation to cure osteomyelitis at this site. I proposed the idea of her going on empiric IV antibiotics but she was not very eager to do that initially . Therefore I initially placed her  doxycycline twice daily which she has come off of now and also Augmentin. She had trouble tolerating this regimen with nausea and diarrhea. She ultimately decided to try IV abbx  We then placed her on IV vancomycin and ceftriaxone x 6 weeks. NOTE her vancomycin levels were very haphazard suggesting that she was not optimally or consistently compliant with her antibiotics.  She claimed that her wound on her foot was healing up somewhat. She ONCE AGAIN at that visit when I last saw her did  NOT want me to examine her sacral wound. This previously was where she had Nec fascitis but was not to bone--though wound care classifies as STAGE IV.  We  switched her to DS Bactrim 2 BID since finishing her abx.  Her ESR and CRP remain elevated at 76 and 2.8.  I again discussed that she would need amputation to cure her osteo in her fibula but that we could try to continue suppressive antibiotics.    Since I last saw her she has continued to follow with Dr. Con Memos with wound care but no longer beng followed  by Dr. Sharol Given.  She states both wounds are doing better. The area on buttocks is no longer being rx with wound vaccuum.  She is being evaluated by Dr. Trula Slade for vascular surgery. He would be understandably concerned re risk of her ankle infection spreading to blood stream and to endovascular space as well as the challenge posed by location of her thigh wound.   Today she allowed me to examine both wounds.    Past Medical History:  Diagnosis Date  . Arthritis   . Asthma   . Chronic back pain   . Depression   . Diabetes mellitus   . Diabetic neuropathy ([place])   . Family history of adverse reaction to anesthesia    mother had difficlty waking   . GERD (gastroesophageal reflux disease)   . Hyperlipidemia   . Joint pain   . Leg pain    With Walking  . Osteomyelitis of right fibula ([place]) 03/05/2017  . PAD (peripheral artery disease) ([place])   . Reflux   . Ulcer    Foot    Past Surgical History:  Procedure Laterality Date  . ABDOMINAL AORTAGRAM  June 15, 2014  . ABDOMINAL AORTAGRAM N/A 06/15/2014   Procedure: ABDOMINAL Maxcine Ham;  Surgeon: Serafina Mitchell, MD;  Location: [facility] CATH LAB;  Service: Cardiovascular;  Laterality: N/A;  . ABDOMINAL AORTAGRAM N/A 11/22/2014  Procedure: ABDOMINAL AORTAGRAM;  Surgeon: Serafina Mitchell, MD;  Location: [facility] CATH LAB;  Service: Cardiovascular;  Laterality: N/A;  . ABDOMINAL AORTOGRAM W/LOWER EXTREMITY N/A 01/07/2017   Procedure: Abdominal Aortogram w/Lower Extremity;  Surgeon: Serafina Mitchell, MD;  Location: [place] CV LAB;  Service: Cardiovascular;  Laterality: N/A;  . ARTERIAL BYPASS SURGRY  07/05/2010   Right Common Femoral to below knee popliteal BPG  . BACK SURGERY     X's  2  . CARDIAC CATHETERIZATION    . CHOLECYSTECTOMY     Gall Bladder  . CYSTECTOMY Right    foot  . CYSTECTOMY Left    wrist  . INTERCOSTAL NERVE BLOCK  November 2015  . IR FLUORO GUIDE CV LINE RIGHT  03/20/2017  . IR US GUIDE VASC ACCESS RIGHT  03/20/2017  . IRRIGATION  AND DEBRIDEMENT BUTTOCKS Right 09/30/2016   Procedure: DEBRIDEMENT RIGHT  BUTTOCK WOUND;  Surgeon: Georganna Skeans, MD;  Location: [place];  Service: General;  Laterality: Right;  . left foot surgery    . left wrist cyst removal Left   . PERIPHERAL VASCULAR CATHETERIZATION N/A 05/07/2016   Procedure: Abdominal Aortogram;  Surgeon: Serafina Mitchell, MD;  Location: [place] CV LAB;  Service: Cardiovascular;  Laterality: N/A;  . PERIPHERAL VASCULAR CATHETERIZATION N/A 05/07/2016   Procedure: Lower Extremity Angiography;  Surgeon: Serafina Mitchell, MD;  Location: [place] CV LAB;  Service: Cardiovascular;  Laterality: N/A;  . PERIPHERAL VASCULAR CATHETERIZATION N/A 05/07/2016   Procedure: Aortic Arch Angiography;  Surgeon: Serafina Mitchell, MD;  Location: [place] CV LAB;  Service: Cardiovascular;  Laterality: N/A;  . PERIPHERAL VASCULAR CATHETERIZATION N/A 05/07/2016   Procedure: Upper Extremity Angiography;  Surgeon: Serafina Mitchell, MD;  Location: [place] CV LAB;  Service: Cardiovascular;  Laterality: N/A;  . PERIPHERAL VASCULAR CATHETERIZATION Right 05/07/2016   Procedure: Peripheral Vascular Balloon Angioplasty;  Surgeon: Serafina Mitchell, MD;  Location: [place] CV LAB;  Service: Cardiovascular;  Laterality: Right;  subclavian  . PERIPHERAL VASCULAR CATHETERIZATION Right 05/07/2016   Procedure: Peripheral Vascular Intervention;  Surgeon: Serafina Mitchell, MD;  Location: [place] CV LAB;  Service: Cardiovascular;  Laterality: Right;  External  Iliac  . SKIN GRAFT Right 2012   [name] by Dr. Nils Pyle- Right and Left Ankle  . SPINE SURGERY    . TONSILLECTOMY      Family History  Problem Relation Age of Onset  . Coronary artery disease Mother   . Peripheral vascular disease Mother   . Heart disease Mother        Before age 35  . Other Mother        Venous insuffiency  . Diabetes Mother   . Hyperlipidemia Mother   . Hypertension Mother   . Varicose Veins Mother   . Heart attack Mother         before age 89  . Heart disease Father   . Diabetes Father   . Diabetes Maternal Grandmother   . Diabetes Paternal Grandmother   . Diabetes Paternal Grandfather   . Diabetes Sister   . Hypertension Sister   . Diabetes Brother   . Hypertension Brother       Social History   Social History  . Marital status: Married    Spouse name: N/A  . Number of children: N/A  . Years of education: N/A   Occupational History  . disabled    Social History Main Topics  . Smoking status: Former Smoker  Years: 30.00    Types: Cigarettes    Quit date: 02/07/2017  . Smokeless tobacco: Never Used  . Alcohol use No  . Drug use: No  . Sexual activity: Yes    Birth control/ protection: Post-menopausal   Other Topics Concern  . None   Social History Narrative  . None    Allergies  Allergen Reactions  . Penicillins Other (See Comments)    Severe Headache (not so sure about this now) Has patient had a PCN reaction causing immediate rash, facial/tongue/throat swelling, SOB or lightheadedness with hypotension: No Has patient had a PCN reaction causing severe rash involving mucus membranes or skin necrosis: No Has patient had a PCN reaction that required hospitalization: No Has patient had a PCN reaction occurring within the last 10 years: No If all of the above answers are "NO", then may proceed with Cephalosporin use.      Current Outpatient Prescriptions:  .  albuterol (PROVENTIL) 2 MG tablet, Take 2 mg by mouth 3 (three) times daily as needed for shortness of breath. , Disp: , Rfl:  .  aspirin 81 MG tablet, Take 81 mg by mouth daily. , Disp: , Rfl:  .  atorvastatin (LIPITOR) 40 MG tablet, Take 40 mg by mouth daily., Disp: , Rfl:  .  cetirizine (ZYRTEC) 10 MG tablet, Take 10 mg by mouth daily as needed for allergies. Reported on 03/27/2016, Disp: , Rfl:  .  cyclobenzaprine (FLEXERIL) 10 MG tablet, Limit 1 tablet by mouth 1  to  3 times per day if tolerated (Patient taking  differently: Take 10 mg by mouth 3 (three) times daily as needed for muscle spasms. ), Disp: 90 tablet, Rfl: 0 .  furosemide (LASIX) 40 MG tablet, Take 40 mg by mouth 2 (two) times daily as needed for fluid. , Disp: , Rfl:  .  HUMALOG KWIKPEN 100 UNIT/ML KiwkPen, Inject 10 Units into the skin 3 (three) times daily. Per sliding scale, Disp: , Rfl:  .  HYDROcodone-acetaminophen (NORCO) 10-325 MG tablet, Limit 1 tablet by mouth per day or twice per day if tolerated (Patient taking differently: Take 1 tablet by mouth See admin instructions. Every 8-12 hours), Disp: 60 tablet, Rfl: 0 .  LANTUS SOLOSTAR 100 UNIT/ML Solostar Pen, Inject 36 Units into the skin 2 (two) times daily. , Disp: , Rfl:  .  LORazepam (ATIVAN) 0.5 MG tablet, Take 1 tablet 1 hour prior to PICC insertion.  May take 2nd pill at hospital if needed., Disp: 2 tablet, Rfl: 0 .  midodrine (PROAMATINE) 10 MG tablet, Take 0.5 tablets (5 mg total) by mouth 3 (three) times daily as needed (lightheadedness and/or low blood pressure)., Disp: 90 tablet, Rfl: 0 .  omeprazole (PRILOSEC) 40 MG capsule, Take 40 mg by mouth daily., Disp: , Rfl:  .  pregabalin (LYRICA) 100 MG capsule, Limit 1 tab by mouth twice a day to 3 times a day if tolerated (Patient taking differently: Take 100 mg by mouth 3 (three) times daily. ), Disp: 90 capsule, Rfl: 0 .  pseudoephedrine-guaifenesin (MUCINEX D) 60-600 MG per tablet, Take 1 tablet by mouth every 12 (twelve) hours as needed for congestion. Reported on 04/08/2016, Disp: , Rfl:  .  sulfamethoxazole-trimethoprim (BACTRIM DS,SEPTRA DS) 800-160 MG tablet, Take 2 tablets by mouth 2 (two) times daily., Disp: 120 tablet, Rfl: 0 .  dicyclomine (BENTYL) 10 MG capsule, Take 10 mg by mouth 4 (four) times daily as needed for spasms., Disp: , Rfl:  .  meclizine (ANTIVERT)  25 MG tablet, Take 25 mg by mouth 3 (three) times daily as needed for dizziness., Disp: , Rfl:  .  ondansetron (ZOFRAN ODT) 4 MG disintegrating tablet, Take 1  tablet (4 mg total) by mouth every 8 (eight) hours as needed for nausea or vomiting. (Patient not taking: Reported on 08/12/2017), Disp: 20 tablet, Rfl: 0 No current facility-administered medications for this visit.   Facility-Administered Medications Ordered in Other Visits:  .  0.9 %  sodium chloride infusion, , Intravenous, Continuous, Brabham, Butch Penny, MD    Review of Systems  Constitutional: Negative for chills and fever.  HENT: Negative for congestion and sore throat.   Eyes: Negative for photophobia.  Respiratory: Negative for cough, shortness of breath and wheezing.   Cardiovascular: Negative for chest pain, palpitations and leg swelling.  Gastrointestinal: Negative for abdominal pain, blood in stool, constipation, nausea and vomiting.  Genitourinary: Negative for dysuria, flank pain and hematuria.  Musculoskeletal: Negative for back pain.  Skin: Positive for wound. Negative for rash.  Neurological: Negative for dizziness, weakness and headaches.  Hematological: Does not bruise/bleed easily.  Psychiatric/Behavioral: Negative for suicidal ideas.       Objective:   Physical Exam  Constitutional: She is oriented to person, place, and time. She appears well-developed and well-nourished. No distress.  HENT:  Head: Normocephalic and atraumatic.  Mouth/Throat: No oropharyngeal exudate.  Eyes: Conjunctivae and EOM are normal. No scleral icterus.  Neck: Normal range of motion. Neck supple.  Cardiovascular: Normal rate and regular rhythm.   Pulmonary/Chest: Effort normal. No respiratory distress. She has no wheezes.  Abdominal: She exhibits no distension.  Musculoskeletal: She exhibits no edema or tenderness.  Neurological: She is alert and oriented to person, place, and time. She exhibits normal muscle tone. Coordination normal.  Skin: Skin is warm. She is not diaphoretic. There is erythema.  Psychiatric: She has a normal mood and affect. Her behavior is normal. Judgment and  thought content normal.   Wound over fibula 03/05/17: some purulent material from wound     Same wound today 05/12/17:        08/12/17: purulent material in dressing      Thigh wound 08/12/17:           Assessment & Plan:   Chronic wound over ankle with osteomyelitis of the fibula  I continue DO NOT think she is going to cure this but we will proceed with XRay today, ESR, CRP. I emphasized risk for bacteremia and seeing of endovascular space  Continue bactrim  Chronic wound over her buttocks:  DOES seem to be improving  Peripheral vascular disease post revascularization without simple options  Diabetes mellitus: Poorly controlled .  I spent greater than 25 minutes with the patient including greater than 50% of time in face to face counsel of the patient re her osteo seen on MRI, her wound in ankle and leg, her vascular disease, he need for films, labs and flu shot and in coordination of her  Care.  I reviewed her most recent plain films and MRI personally.

## 2017-08-13 LAB — BASIC METABOLIC PANEL WITH GFR
BUN / CREAT RATIO: 15 (calc) (ref 6–22)
BUN: 19 mg/dL (ref 7–25)
CO2: 22 mmol/L (ref 20–32)
CREATININE: 1.24 mg/dL — AB (ref 0.50–1.05)
Calcium: 9 mg/dL (ref 8.6–10.4)
Chloride: 105 mmol/L (ref 98–110)
GFR, Est African American: 57 mL/min/{1.73_m2} — ABNORMAL LOW (ref >=60)
GFR, Est Non African American: 50 mL/min/{1.73_m2} — ABNORMAL LOW (ref >=60)
Glucose, Bld: 102 mg/dL — ABNORMAL HIGH (ref 65–99)
POTASSIUM: 5.7 mmol/L — AB (ref 3.5–5.3)
SODIUM: 136 mmol/L (ref 135–146)

## 2017-08-13 LAB — CBC WITH DIFFERENTIAL/PLATELET
Basophils Absolute: 37 cells/uL (ref 0–200)
Basophils Relative: 0.5 %
EOS PCT: 2.7 %
Eosinophils Absolute: 200 cells/uL (ref 15–500)
HCT: 37 % (ref 35.0–45.0)
Hemoglobin: 12.2 g/dL (ref 11.7–15.5)
Lymphs Abs: 2760 cells/uL (ref 850–3900)
MCH: 27.4 pg (ref 27.0–33.0)
MCHC: 33 g/dL (ref 32.0–36.0)
MCV: 83.1 fL (ref 80.0–100.0)
MONOS PCT: 6.1 %
MPV: 10.5 fL (ref 7.5–12.5)
NEUTROS PCT: 53.4 %
Neutro Abs: 3952 cells/uL (ref 1500–7800)
PLATELETS: 233 10*3/uL (ref 140–400)
RBC: 4.45 10*6/uL (ref 3.80–5.10)
RDW: 13.8 % (ref 11.0–15.0)
Total Lymphocyte: 37.3 %
WBC mixed population: 451 cells/uL (ref 200–950)
WBC: 7.4 10*3/uL (ref 3.8–10.8)

## 2017-08-13 LAB — SEDIMENTATION RATE: Sed Rate: 46 mm/h — ABNORMAL HIGH (ref 0–30)

## 2017-08-13 LAB — C-REACTIVE PROTEIN: CRP: 11.5 mg/L — AB (ref <8.0)

## 2017-08-14 ENCOUNTER — Telehealth: Payer: Self-pay | Admitting: *Deleted

## 2017-08-14 ENCOUNTER — Other Ambulatory Visit: Payer: Self-pay

## 2017-08-14 DIAGNOSIS — I6529 Occlusion and stenosis of unspecified carotid artery: Secondary | ICD-10-CM

## 2017-08-14 NOTE — Telephone Encounter (Signed)
-----   Message from Truman Hayward, MD sent at 08/13/2017  9:27 AM EDT ----- Plain films are NOT good news. As even they are showing osteo now. I sent note in Epic to Dr. Sharol Given and Dr. Con Memos. I think she is going to need BKA vs AKA to cure this (which I told her before)  Can we make sure that she goes back to see Dr. Sharol Given and reschedule her with me in one month instead of 3

## 2017-08-14 NOTE — Telephone Encounter (Signed)
RN called patient, relayed the results of the XRays and Dr Lucianne Lei Dam's instructions to follow up next month here and with Dr Sharol Given and Dr Con Memos. Patient stated that it just looked like that because the wound was not clean.  RN explained that Dr [name] wasn't looking at the outside of the wound, but at what was going on inside. Patient did not seem to understand, claimed that it was fine.  RN scheduled patient to see Dr [name] for 10/18 at 3:30. She stated her husband is at work and she will have to call back tomorrow to make sure this is a good time for him to bring her to the office. RN called Dr Jess Barters office to request they reach out to her today. Landis Gandy, RN

## 2017-08-14 NOTE — Telephone Encounter (Signed)
Thanks [place]. Dr. Con Memos also reached out to me and I will call him as well

## 2017-08-15 ENCOUNTER — Encounter: Payer: Medicare Other | Admitting: Physician Assistant

## 2017-08-15 DIAGNOSIS — E11622 Type 2 diabetes mellitus with other skin ulcer: Secondary | ICD-10-CM | POA: Diagnosis not present

## 2017-08-17 NOTE — Progress Notes (Signed)
[name], [name] ([id number]) Visit Report for 08/15/2017 Chief Complaint Document Details Patient Name: [name], [name]. Date of Service: 08/15/2017 10:15 AM Medical Record Number: [id number] Patient Account Number: [id number] Date of Birth/Sex: September 03, 1966 (51 y.o. Female) Treating RN: Cornell Barman Primary Care Provider: Delight Stare Other Clinician: Referring Provider: Delight Stare Treating Provider/Extender: Melburn Hake, Jervis Trapani Weeks in Treatment: 29 Information Obtained from: Patient Chief Complaint She is here in follow up evaluation for right ischial and right lateral malleolus Electronic Signature(s) Signed: 08/15/2017 5:13:37 PM By: Worthy Keeler PA-C Entered By: Worthy Keeler on 08/15/2017 10:25:34 Dudzinski, [name] ([id number]) -------------------------------------------------------------------------------- Debridement Details Patient Name: [name]. Date of Service: 08/15/2017 10:15 AM Medical Record Number: [id number] Patient Account Number: [id number] Date of Birth/Sex: [birth year] (51 y.o. Female) Treating RN: Cornell Barman Primary Care Provider: Delight Stare Other Clinician: Referring Provider: Delight Stare Treating Provider/Extender: Melburn Hake, Tinzley Dalia Weeks in Treatment: 29 Debridement Performed for Wound #1 Right,Lateral Malleolus Assessment: Performed By: Physician STONE III, Tericka Devincenzi E., PA-C Debridement: Open Wound/Selective Severity of Tissue Pre Fat layer exposed Debridement: Debridement Selective Description: Pre-procedure Verification/Time Out Yes - 10:35 Taken: Start Time: 10:36 Pain Control: Other : lidocaine 4% Level: Non-Viable Tissue Total Area Debrided (L x 1.2 (cm) x 1.1 (cm) = 1.32 (cm) W): Tissue and other Skin material debrided: Instrument: Curette Bleeding: Minimum Hemostasis Achieved: Pressure End Time: 10:38 Procedural Pain: 0 Post Procedural Pain: 0 Response to Treatment: Procedure was tolerated well Post Debridement Measurements  of Total Wound Length: (cm) 1.2 Width: (cm) 1.1 Depth: (cm) 0.4 Volume: (cm) 0.415 Character of Wound/Ulcer Post Stable Debridement: Severity of Tissue Post Debridement: Fat layer exposed Post Procedure Diagnosis Same as Pre-procedure Electronic Signature(s) ALLANNA, BRESEE ([id number]) Signed: 08/15/2017 4:29:39 PM By: Gretta Cool, [name], RN, CWS, [name], [name] Signed: 08/15/2017 5:13:37 PM By: Worthy Keeler PA-C Entered By: Gretta Cool, [name], RN, CWS, Kim on 08/15/2017 10:38:37 Carfagno, [name] ([id number]) -------------------------------------------------------------------------------- HPI Details Patient Name: [name], [name]. Date of Service: 08/15/2017 10:15 AM Medical Record Number: [id number] Patient Account Number: [id number] Date of Birth/Sex: [birth year] (51 y.o. Female) Treating RN: Cornell Barman Primary Care Provider: Delight Stare Other Clinician: Referring Provider: Delight Stare Treating Provider/Extender: Melburn Hake, Jissell Trafton Weeks in Treatment: 29 History of Present Illness Location: right lateral ankle and right gluteal area Quality: Patient reports experiencing a sharp pain to affected area(s). Severity: Patient states wound are getting better slowly Duration: Patient has had the wound for > 4 months prior to seeking treatment at the wound center Timing: Pain in wound is constant (hurts all the time) Context: The wound occurred when the patient was in hospital with a necrotizing fasciitis of the right gluteal area and a ulceration on the right ankle Modifying Factors: Other treatment(s) tried include:admitted to the hospital for IV antibiotics and a full workup and has also had a recent angiogram Associated Signs and Symptoms: Patient reports having increase discharge. HPI Description: 51 year old patient was sent to [place] from [facility] where she was seen by Dr. Sherril Cong for a left ankle ulceration and was recently hospitalized with hypotension and sepsis. She was  treated with IV antibiotics and has been scheduled to see Dr. Sharol Given. She was seen by vascular surgery who recommended a femoral bypass but surgery has been delayed until her sacral wound from last year's necrotizing fasciitis has healed. Was seeing the wound care team at [facility] but wanted to change over. She is a smoker and smokes a pack of cigarettes a day  The patient was recently admitted in [place] between February 2 and February 14. She had a follow- up to see vascular surgery, orthopedic surgery and infectious disease. 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 Review of the electronic medical records reveals that Dr. 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 An MRI showed edema in the bone which was consistent with osteomyelitis and the chronic ulcer and may need surgical intervention. She also had a sacral decubitus ulcer which was treated with the wound VAC. On 01/07/2017 she was taken up by the vascular surgeon Dr. Trula Slade for an abdominal aortogram and bilateral lower extremity runoff, for a history of having bilateral femoropopliteal bypass graft as well as external iliac stenting on the right and stenting of her bypass graft. She had developed a nonhealing wound on her right ankle and there was a possibility of a femoral occlusion. the findings were noted and the impression was a surgical revascularization with a aorto bifemoral bypass graft. Both the femoropopliteal bypass grafts were patent. 01/31/2017 -- x-ray of the right hip and pelvis --  IMPRESSION:No radiographic evidence of acute osteomyelitis. Normal-appearing right hip joint space for age. No acute bony abnormality of the hip. 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 Since seeing her last week she has not had an appointment with infectious disease or the orthopedic specialist yet. [name], [name] ([id number]) 02/06/2017 -- the patient missed a couple of appointments yesterday due to the weather but other than that has apparently given up smoking for the last 5 days. 02/13/2017 -- she has rescheduled her infectious disease appointment and also the appointment with the orthopedic surgeon Dr. Sharol Given 03/06/2017 -- she was seen by Dr. Lucianne Lei dam of infectious disease on 03/05/2017 and he recommendedIV antibiotics but the patient did not want to have that and he has given her Augmentin and doxycycline and will reevaluate her in 2 months time. 03/13/2017 -- he saw Dr. Trula Slade regarding her vascular issues and he would like to wait to the sacral wound is completely closed before considering aortobifemoral bypass graft. He will see her back in 2 months time. She was also seen by Dr. Sharol Given of orthopedics who recommended continue wound care dressings and follow in the office in about 2 months time. he also recommended 3 view radiographs of the right ankle at follow-up. 03/28/2017 -- she did have a PICC line placed and Dr. Lucianne Lei dam has begun IV vancomycin and ceftriaxone. she is going to continue this until she sees him in approximately a month's time 04/18/2017 -- we had applied for a skin substitute for the patient's care but her copayment is going to be about $300 a piece and the patient will not be able to afford this. 05/08/17 on evaluation today patient appears to potentially have a fungal rash in the periwound region of the sacral area. This also seems to extend into the inguinal creases and factional region as well. She is  tender to palpation with light rubbing over the region of the periwound and the skin in this region does have  a beefy red appearance. Everything seems to point to a fungal infection at this time. She has been tolerating the dressing changes up to this point. There is no evidence of infection otherwise. 05/16/2017 -- was seen by Dr. 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 He did not think that she is making progress and was not able to examine her sacral decubitus ulcer as the patient would not allow it. She was switched to Bactrim DS 2 tablets twice a day since finishing her antibiotics. Her ESR was 76 and a CRP was 2.8. 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 05/23/17 on evaluation today patient's wound appears to be doing about the same in regard to the ankle as well as the gluteal area. She apparently has issues with the one back staying in place and she states the sill seems to break up the inferior aspect. She does have home help coming out to apply the wound VAC. She has been tolerating the dressing changes without any problem although she has had some increased pain in the gluteal region due to a fall she sustained and she feels like she brews that area and she had pain actually radiating down her leg which is slowly beginning to improve. There's no evidence of infection. 05/29/17 Unfortunately on evaluation today patient's wounds both in regard to the ankle as well as in regard to her gluteal region on the right appear to be measuring the same as last week's evaluation. She also tells me that she is having soreness today in regard to the right gluteal area because she had a visitor who came to see her a couple days ago and stayed for six hours and she had to set up for that entire length of  time visiting. This has called some increased discomfort. Normally she does not set up for those long periods of time. Fortunately there's no evidence of infection and no worsening of the wounds. 06/13/2017 -- the patient has had a lot of health issues including perfuse diarrhea and generalized weakness and I have instructed her to see her PCP as soon as possible and if things get worse to go to the ER. She has not brought her wound VAC today. She continues to be on oral antibiotics 06/30/17- she is here in follow up evaluation for right ischial and right lateral malleolus ulcer. She continues with negative pressure wound therapy to the ischial wound. She is currently on a diabetic therapy per NOLYN, EILERT. ([id number]) infectious disease, although she does state that she inconsistently takes it. She was advised to take her antibiotic as prescribed 07/11/2017 -- she has been unable to use a wound VAC regularly because she loses this evening and it has not been possible for her to use it. 07/18/2017 -- she is much more comfortable and looks better without the wound VAC and at this stage I believe it will be better for her to return the machine and continue with local care. 08/07/2017 -- she is back up to 3 weeks due to a vacation and during this time her wound VAC was returned. She has been doing local dressings 08/15/17 on evaluation today patient appears to be doing about the same in regard to her ankle as well as right gluteal wound. She has been tolerating the dressing changes and  tells me that it's mainly her gluteal wound that hurts the ankle is nontender to palpation. She rates this pain to be a one out of 10 at rest and with cleansing of the wound five out of 10 at least. No fevers, chills, nausea, or vomiting noted at this time. She does note that home health has told her to inquire about possibly using silver nitrate on the wound edges due to the road nature of the edges. Electronic  Signature(s) Signed: 08/15/2017 5:13:37 PM By: Worthy Keeler PA-C Entered By: Worthy Keeler on 08/15/2017 10:46:45 Cowles, [name] ([id number]) -------------------------------------------------------------------------------- Physical Exam Details Patient Name: [name], [name] Date of Service: 08/15/2017 10:15 AM Medical Record Number: [id number] Patient Account Number: [id number] Date of Birth/Sex: 12/07/1965 (51 y.o. Female) Treating RN: Cornell Barman Primary Care Provider: Delight Stare Other Clinician: Referring Provider: Delight Stare Treating Provider/Extender: STONE III, Brittanya Winburn Weeks in Treatment: 74 Constitutional Well-nourished and well-hydrated in no acute distress. Respiratory normal breathing without difficulty. Psychiatric this patient is able to make decisions and demonstrates good insight into disease process. Alert and Oriented x 3. pleasant and cooperative. Notes In regard to patients ankle wound I did perform a debridement to sharply remove the rolled edges on evaluation today. She tolerated this well without complication and no pain. She did have bleeding which was controlled with pressure. Overall I'm hopeful that this will stimulate the edges to start to feeling better in regard to the ankle going forward. Electronic Signature(s) Signed: 08/15/2017 5:13:37 PM By: Worthy Keeler PA-C Entered By: Worthy Keeler on 08/15/2017 10:47:31 [place], [name] ([id number]) -------------------------------------------------------------------------------- Physician Orders Details Patient Name: [name], [name]. Date of Service: 08/15/2017 10:15 AM Medical Record Number: [id number] Patient Account Number: [id number] Date of Birth/Sex: 10-23-1966 (51 y.o. 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 Wound #2 Right Gluteus o Clean wound with Normal Saline. Anesthetic Wound #1 Right,Lateral Malleolus o Topical Lidocaine 4% cream applied to wound bed prior to debridement Wound #2 Right Gluteus o Topical Lidocaine 4% cream applied to wound bed prior to debridement Primary Wound Dressing Wound #1 Right,Lateral Malleolus o Aquacel Ag Wound #2 Right Gluteus o Aquacel Ag Secondary Dressing Wound #1 Right,Lateral Malleolus o Boardered Foam Dressing Wound #2 Right Gluteus [name], [name] ([id number]) o Boardered Foam Dressing Dressing Change Frequency Wound #1 Right,Lateral Malleolus o Change Dressing Monday, Wednesday, Friday Wound #2 Right Gluteus o Change Dressing Monday, Wednesday, Friday Follow-up Appointments Wound #1 Right,Lateral Malleolus o Return Appointment in 2 weeks. Wound #2 Right Gluteus o Return Appointment in 2 weeks. Off-Loading Wound #1 Right,Lateral Malleolus o Turn and reposition every 2 hours Wound #2 Right Gluteus o Turn and reposition every 2 hours Home Health Wound #1 [place] Visits - Monday, Wednesday, Friday o Home Health Nurse may visit PRN to address patientos wound care needs. o FACE TO FACE ENCOUNTER: MEDICARE and MEDICAID PATIENTS: I certify that this  patient is under my care and that I had a face-to-face encounter that meets the physician face-to-face encounter  requirements with this patient on this date. The encounter with the patient was in whole or in part for the following MEDICAL CONDITION: (primary reason for [place]) MEDICAL NECESSITY: I certify, that based on my findings, NURSING services are a medically necessary home health service. HOME BOUND STATUS: I certify that my clinical findings support that this patient is homebound (i.e., Due to illness or injury, pt requires aid of supportive devices such as crutches, cane, wheelchairs, walkers, the use of special transportation or the assistance of another person to leave their place of residence. There is a normal inability to leave the home and doing so requires considerable and taxing effort. Other absences are for medical reasons / religious services and are infrequent or of short duration when for other reasons). o If current dressing causes regression in wound condition, may D/C ordered dressing product/s and apply Normal Saline Moist Dressing daily until next [place] / Other MD appointment. Eugenio Saenz of regression in wound condition at [phone]. o Please direct any NON-WOUND related issues/requests for orders to patient's Primary Care Physician Wound #2 Right [place] Visits - Monday, Wednesday, Friday o Home Health Nurse may visit PRN to address patientos wound care needs. BRUNILDA, EBLE ([id number]) o FACE TO FACE ENCOUNTER: MEDICARE and MEDICAID PATIENTS: I certify that this patient is under my care and that I had a face-to-face encounter that meets the physician face-to-face encounter requirements with this patient on this date. The encounter with the patient was in whole or in part for the following MEDICAL CONDITION: (primary reason for [place]) MEDICAL NECESSITY: I certify, that based on my findings, NURSING services are a medically necessary home health service. HOME BOUND STATUS: I certify that  my clinical findings support that this patient is homebound (i.e., Due to illness or injury, pt requires aid of supportive devices such as crutches, cane, wheelchairs, walkers, the use of special transportation or the assistance of another person to leave their place of residence. There is a normal inability to leave the home and doing so requires considerable and taxing effort. Other absences are for medical reasons / religious services and are infrequent or of short duration when for other reasons). o If current dressing causes regression in wound condition, may D/C ordered dressing product/s and apply Normal Saline Moist Dressing daily until next [place] / Other MD appointment. [place] of regression in wound condition at [phone]. o Please direct any NON-WOUND related issues/requests for orders to patient's Primary Care Physician Electronic Signature(s) Signed: 08/15/2017 4:29:39 PM By: Gretta Cool, [name], RN, CWS, [name], [name] Signed: 08/15/2017 5:13:37 PM By: Worthy Keeler PA-C Entered By: Gretta Cool, [name], RN, CWS, Kim on 08/15/2017 10:43:33 [name], [name] ([id number]) -------------------------------------------------------------------------------- Problem List Details Patient Name: [name], FERA. Date of Service: 08/15/2017 10:15 AM Medical Record Number: [id number] Patient Account Number: [id number] Date of Birth/Sex: 20-Feb-1966 (51 y.o. Female) Treating RN: Cornell Barman Primary Care Provider: Delight Stare Other Clinician: Referring Provider: Delight Stare Treating Provider/Extender: Melburn Hake, Lessa Huge Weeks in Treatment: 29 Active Problems ICD-10 Encounter Code Description Active Date Diagnosis E11.621 Type 2 diabetes mellitus with foot ulcer 01/23/2017 Yes L89.314 Pressure ulcer of right buttock, stage 4 01/23/2017 Yes L97.312 Non-pressure chronic ulcer of right ankle with fat layer 01/23/2017 Yes exposed F17.218 Nicotine dependence, cigarettes, with  other nicotine- 01/23/2017 Yes  induced disorders I70.233 Atherosclerosis of native arteries of right leg with 01/23/2017 Yes ulceration of ankle M86.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, right ankle and foot 01/23/2017 Yes Inactive Problems Resolved Problems Electronic Signature(s) Signed: 08/15/2017 5:13:37 PM By: Worthy Keeler PA-C Entered By: Worthy Keeler on 08/15/2017 10:25:13 Eguia, Gwendola S. ([id number]) -------------------------------------------------------------------------------- Progress Note Details Patient Name: [name]. Date of Service: 08/15/2017 10:15 AM Medical Record Number: [id number] Patient Account Number: [id number] Date of Birth/Sex: 1966/03/16 (51 y.o. Female) Treating RN: Cornell Barman Primary Care Provider: Delight Stare Other Clinician: Referring Provider: Delight Stare Treating Provider/Extender: Melburn Hake, Tamberly Pomplun Weeks in Treatment: 29 Subjective Chief Complaint Information obtained from Patient She is here in follow up evaluation for right ischial and right lateral malleolus History of Present Illness (HPI) The following HPI elements were documented for the patient's wound: Location: right lateral ankle and right gluteal area Quality: Patient reports experiencing a sharp pain to affected area(s). Severity: Patient states wound are getting better slowly Duration: Patient has had the wound for > 4 months prior to seeking treatment at the wound center Timing: Pain in wound is constant (hurts all the time) Context: The wound occurred when the patient was in hospital with a necrotizing fasciitis of the right gluteal area and a ulceration on the right ankle Modifying Factors: Other treatment(s) tried include:admitted to the hospital for IV antibiotics and a full workup and has also had a recent angiogram Associated Signs and Symptoms: Patient reports having increase discharge. 51 year old patient was sent to [place] from [facility] where she was  seen by Dr. Sherril Cong for a left ankle ulceration and was recently hospitalized with hypotension and sepsis. She was treated with IV antibiotics and has been scheduled to see Dr. Sharol Given. She was seen by vascular surgery who recommended a femoral bypass but surgery has been delayed until her sacral wound from last year's necrotizing fasciitis has healed. Was seeing the wound care team at [facility] but wanted to change over. She is a smoker and smokes a pack of cigarettes a day The patient was recently admitted in [place] between February 2 and February 14. She had a follow- up to see vascular surgery, orthopedic surgery and infectious disease. 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 Review of the electronic medical records reveals that Dr. 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 An MRI showed edema in the bone which was consistent with osteomyelitis and the chronic ulcer and may need surgical intervention. She also had a sacral decubitus ulcer which was treated with the wound VAC. On 01/07/2017 she was taken up by the vascular surgeon Dr. Trula Slade for an abdominal aortogram and bilateral lower extremity runoff, for a history of having bilateral femoropopliteal bypass graft as well as external iliac stenting on the right and stenting of her bypass graft. She had developed a nonhealing wound on her right ankle and there was a possibility of a femoral occlusion. the findings were noted and the impression was a surgical revascularization with a aorto bifemoral bypass graft.  Both the femoropopliteal Spagnuolo, [place]. ([id number]) bypass grafts were patent. 01/31/2017 -- x-ray of  the right hip and pelvis -- IMPRESSION:No radiographic evidence of acute osteomyelitis. Normal-appearing right hip joint space for age. No acute bony abnormality of the hip. 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 Since seeing her last week she has not had an appointment with infectious disease or the orthopedic specialist yet. 02/06/2017 -- the patient missed a couple of appointments yesterday due to the weather but other than that has apparently given up smoking for the last 5 days. 02/13/2017 -- she has rescheduled her infectious disease appointment and also the appointment with the orthopedic surgeon Dr. Sharol Given 03/06/2017 -- she was seen by Dr. Lucianne Lei dam of infectious disease on 03/05/2017 and he recommendedIV antibiotics but the patient did not want to have that and he has given her Augmentin and doxycycline and will reevaluate her in 2 months time. 03/13/2017 -- he saw Dr. Trula Slade regarding her vascular issues and he would like to wait to the sacral wound is completely closed before considering aortobifemoral bypass graft. He will see her back in 2 months time. She was also seen by Dr. Sharol Given of orthopedics who recommended continue wound care dressings and follow in the office in about 2 months time. he also recommended 3 view radiographs of the right ankle at follow-up. 03/28/2017 -- she did have a PICC line placed and Dr. Lucianne Lei dam has begun IV vancomycin and ceftriaxone. she is going to continue this until she sees him in approximately a month's time 04/18/2017 -- we had applied for a skin substitute for the patient's care but her copayment is going to be about $300 a piece and the patient will not be able to afford this. 05/08/17 on evaluation today patient appears to potentially have a fungal rash in the periwound region of  the sacral area. This also seems to extend into the inguinal creases and factional region as well. She is tender to palpation with light rubbing over the region of the periwound and the skin in this region does have a beefy red appearance. Everything seems to point to a fungal infection at this time. She has been tolerating the dressing changes up to this point. There is no evidence of infection otherwise. 05/16/2017 -- was seen by Dr. 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 He did not think that she is making progress and was not able to examine her sacral decubitus ulcer as the patient would not allow it. She was switched to Bactrim DS 2 tablets twice a day since finishing her antibiotics. Her ESR was 76 and a CRP was 2.8. 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 05/23/17 on evaluation today patient's wound appears to be doing about the same in regard to the ankle as well as the gluteal area. She apparently has issues with the one back staying in place and she states the sill seems to break up the inferior aspect. She does have home help coming out to apply the wound VAC. She has been tolerating the dressing changes without any problem although she has had some increased pain in the gluteal region due to a fall she sustained and she feels like she brews that area and she had pain actually radiating down her leg which is slowly beginning to improve. There's no evidence  of infection. 05/29/17 Unfortunately on evaluation today patient's wounds both in regard to the ankle as well as in regard to her gluteal region on the right appear to be measuring the same as last week's evaluation. She also tells me that she is having soreness today in regard to the right gluteal area because she had a visitor who came to  see her a couple days ago and stayed for six hours and she had to set up for that entire length of time visiting. This has called some increased discomfort. Normally she does not set up for those long periods of [place], [place]. ([id number]) time. Fortunately there's no evidence of infection and no worsening of the wounds. 06/13/2017 -- the patient has had a lot of health issues including perfuse diarrhea and generalized weakness and I have instructed her to see her PCP as soon as possible and if things get worse to go to the ER. She has not brought her wound VAC today. She continues to be on oral antibiotics 06/30/17- she is here in follow up evaluation for right ischial and right lateral malleolus ulcer. She continues with negative pressure wound therapy to the ischial wound. She is currently on a diabetic therapy per infectious disease, although she does state that she inconsistently takes it. She was advised to take her antibiotic as prescribed 07/11/2017 -- she has been unable to use a wound VAC regularly because she loses this evening and it has not been possible for her to use it. 07/18/2017 -- she is much more comfortable and looks better without the wound VAC and at this stage I believe it will be better for her to return the machine and continue with local care. 08/07/2017 -- she is back up to 3 weeks due to a vacation and during this time her wound VAC was returned. She has been doing local dressings 08/15/17 on evaluation today patient appears to be doing about the same in regard to her ankle as well as right gluteal wound. She has been tolerating the dressing changes and tells me that it's mainly her gluteal wound that hurts the ankle is nontender to palpation. She rates this pain to be a one out of 10 at rest and with cleansing of the wound five out of 10 at least. No fevers, chills, nausea, or vomiting noted at this time. She does note that home health has told her to inquire about  possibly using silver nitrate on the wound edges due to the road nature of the edges. Objective Constitutional Well-nourished and well-hydrated in no acute distress. Vitals Time Taken: 10:18 AM, Height: 67 in, Weight: 137 lbs, BMI: 21.5, Temperature: 98.0 F, Pulse: 87 bpm, Respiratory Rate: 16 breaths/min, Blood Pressure: 107/53 mmHg. Respiratory normal breathing without difficulty. Psychiatric this patient is able to make decisions and demonstrates good insight into disease process. Alert and Oriented x 3. pleasant and cooperative. [name], [name] ([id number]) General Notes: In regard to patients ankle wound I did perform a debridement to sharply remove the rolled edges on evaluation today. She tolerated this well without complication and no pain. She did have bleeding which was controlled with pressure. Overall I'm hopeful that this will stimulate the edges to start to feeling better in regard to the ankle going forward. Integumentary (Hair, Skin) Wound #1 status is Open. Original cause of wound was Gradually Appeared. The wound is located on the Right,Lateral Malleolus. The wound measures 1.2cm length x 1.1cm width x 0.4cm depth; 1.037cm^2 area and 0.415cm^3  volume. There is Fat Layer (Subcutaneous Tissue) Exposed exposed. There is no tunneling noted, however, there is undermining starting at 12:00 and ending at 12:00 with a maximum distance of 0.9cm. There is a large amount of serous drainage noted. The wound margin is distinct with the outline attached to the wound base. There is large (67-100%) pink granulation within the wound bed. There is a small (1-33%) amount of necrotic tissue within the wound bed including Adherent Slough. The periwound skin appearance exhibited: Induration, Maceration, Erythema. The periwound skin appearance did not exhibit: Callus, Crepitus, Excoriation, Rash, Scarring, Dry/Scaly, Atrophie Blanche, Cyanosis, Ecchymosis, Hemosiderin Staining, Mottled,  Pallor, Rubor. The surrounding wound skin color is noted with erythema which is circumferential. Periwound temperature was noted as No Abnormality. The periwound has tenderness on palpation. Wound #2 status is Open. Original cause of wound was Gradually Appeared. The wound is located on the Right Gluteus. The wound measures 2.4cm length x 1cm width x 1.5cm depth; 1.885cm^2 area and 2.827cm^3 volume. There is Fat Layer (Subcutaneous Tissue) Exposed exposed. There is no tunneling noted. There is a large amount of serous drainage noted. Foul odor after cleansing was noted. The wound margin is distinct with the outline attached to the wound base. There is large (67-100%) red, pink granulation within the wound bed. There is no necrotic tissue within the wound bed. The periwound skin appearance exhibited: Maceration, Ecchymosis. Periwound temperature was noted as No Abnormality. The periwound has tenderness on palpation. 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 ([id number]) Procedures Wound #1 Pre-procedure diagnosis of Wound #1 is a Diabetic Wound/Ulcer of the Lower Extremity located on the Right,Lateral Malleolus .Severity of Tissue Pre Debridement is: Fat layer exposed. There was a Non-Viable Tissue Open Wound/Selective [phone]) debridement with total area of 1.32 sq cm performed by STONE III, Kelina Beauchamp E., PA-C. with the following instrument(s): Curette including Skin after achieving pain control using Other (lidocaine 4%). A time out was conducted at 10:35, prior to the start of the  procedure. A Minimum amount of bleeding was controlled with Pressure. The procedure was tolerated well with a pain level of 0 throughout and a pain level of 0 following the procedure. Post Debridement Measurements: 1.2cm length x 1.1cm width x 0.4cm depth; 0.415cm^3 volume. Character of Wound/Ulcer Post Debridement is stable. Severity of Tissue Post Debridement is: Fat layer exposed. Post procedure Diagnosis Wound #1: Same as Pre-Procedure Plan Wound Cleansing: Wound #1 Right,Lateral Malleolus: Clean wound with Normal Saline. Wound #2 Right Gluteus: Clean wound with Normal Saline. Anesthetic: Wound #1 Right,Lateral Malleolus: Topical Lidocaine 4% cream applied to wound bed prior to debridement Wound #2 Right Gluteus: Topical Lidocaine 4% cream applied to wound bed prior to debridement Primary Wound Dressing: Wound #1 Right,Lateral Malleolus: Aquacel Ag Wound #2 Right Gluteus: Aquacel Ag Secondary Dressing: Wound #1 Right,Lateral Malleolus: Boardered Foam Dressing Wound #2 Right Gluteus: Boardered Foam Dressing Dressing Change Frequency: Wound #1 Right,Lateral Malleolus: Change Dressing Monday, Wednesday, Friday Wound #2 Right Gluteus: Change Dressing Monday, Wednesday, Friday Follow-up Appointments: Wound #1 Right,Lateral Malleolus: Jaco, Arletta S. ([id number]) Return Appointment in 2 weeks. Wound #2 Right Gluteus: Return Appointment in 2 weeks. Off-Loading:  Wound #1 Right,Lateral Malleolus: Turn and reposition every 2 hours Wound #2 Right Gluteus: Turn and reposition every 2 hours Home Health: Wound #1 Right,Lateral Malleolus: [place] Visits - Monday, Wednesday, Friday [place] Nurse may visit PRN to address patient s wound care needs. FACE TO FACE ENCOUNTER: MEDICARE and MEDICAID PATIENTS: I certify that this patient is under my care and that I had a face-to-face encounter that meets the physician face-to-face encounter requirements with this  patient on this date. The encounter with the patient was in whole or in part for the following MEDICAL CONDITION: (primary reason for [place]) MEDICAL NECESSITY: I certify, that based on my findings, NURSING services are a medically necessary home health service. HOME BOUND STATUS: I certify that my clinical findings support that this patient is homebound (i.e., Due to illness or injury, pt requires aid of supportive devices such as crutches, cane, wheelchairs, walkers, the use of special transportation or the assistance of another person to leave their place of residence. There is a normal inability to leave the home and doing so requires considerable and taxing effort. Other absences are for medical reasons / religious services and are infrequent or of short duration when for other reasons). If current dressing causes regression in wound condition, may D/C ordered dressing product/s and apply Normal Saline Moist Dressing daily until next [place] / Other MD appointment. [place] of regression in wound condition at [phone]. Please direct any NON-WOUND related issues/requests for orders to patient's Primary Care Physician Wound #2 Right Gluteus: [place] Visits - Monday, Wednesday, Friday Home Health Nurse may visit PRN to address patient s wound care needs. FACE TO FACE ENCOUNTER: MEDICARE and MEDICAID PATIENTS: I certify that this patient is under my care and that I had a face-to-face encounter that meets the physician face-to-face encounter requirements with this patient on this date. The encounter with the patient was in whole or in part for the following MEDICAL CONDITION: (primary reason for [place]) MEDICAL NECESSITY: I certify, that based on my findings, NURSING services are a medically necessary home health service. HOME BOUND STATUS: I certify that my clinical findings support that this patient is homebound (i.e., Due  to illness or injury, pt requires aid of supportive devices such as crutches, cane, wheelchairs, walkers, the use of special transportation or the assistance of another person to leave their place of residence. There is a normal inability to leave the home and doing so requires considerable and taxing effort. Other absences are for medical reasons / religious services and are infrequent or of short duration when for other reasons). If current dressing causes regression in wound condition, may D/C ordered dressing product/s and apply Normal Saline Moist Dressing daily until next [place] / Other MD appointment. [place] of regression in wound condition at [phone]. Please direct any NON-WOUND related issues/requests for orders to patient's Primary Care Physician ANNJANETTE, WERTENBERGER ([id number]) We will continue with the silver alginate dressing and then will see patient for reevaluation in one week to see were things stand at that point. If anything worsens in the interim she will contact our office for additional recommendations. Otherwise hopefully the sharp debridement of the rolled edges in regard to her lateral ankle wound will help [place] improve. Electronic Signature(s) Signed: 08/15/2017 5:13:37 PM By: Worthy Keeler PA-C Entered By: Worthy Keeler on 08/15/2017 10:48:39 Luther, [name] ([id number]) -------------------------------------------------------------------------------- SuperBill Details Patient Name: [name]  S. Date of Service: 08/15/2017 Medical Record Number: [id number] Patient Account Number: [id number] Date of Birth/Sex: 03/24/1966 (51 y.o. 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 M86.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, right ankle and foot Facility Procedures CPT4 Code Description: [id number] - DEBRIDE WOUND 1ST 20 SQ CM OR < ICD-10 Description Diagnosis L97.312 Non-pressure chronic ulcer of right ankle with fat la Modifier: yer exposed Quantity: 1 Physician Procedures CPT4 Code Description: [id number] - WC PHYS DEBR WO ANESTH 20 SQ CM ICD-10 Description Diagnosis L97.312 Non-pressure chronic ulcer of right ankle with fat la Modifier: yer exposed Quantity: 1 Electronic Signature(s) Signed: 08/15/2017 5:13:37 PM By: Worthy Keeler PA-C Entered By: Worthy Keeler on 08/15/2017 10:48:52

## 2017-08-18 NOTE — Progress Notes (Signed)
Jamie, VENNEMAN ([id number]) Visit Report for 08/15/2017 Arrival Information Details Patient Name: [name], [name]. Date of Service: 08/15/2017 10:15 AM Medical Record Number: [id number] Patient Account Number: [id number] Date of Birth/Sex: [birth year] (51 y.o. Female) Treating RN: Cornell Barman Primary Care Yacine Garriga: Delight Stare Other Clinician: Referring Hodge Stachnik: Delight Stare Treating Calypso Hagarty/Extender: Melburn Hake, HOYT Weeks in Treatment: 4 Visit Information History Since Last Visit Added or deleted any medications: No Patient Arrived: Ambulatory Any new allergies or adverse reactions: No Arrival Time: 10:15 Had a fall or experienced change in No Accompanied By: self activities of daily living that may affect Transfer Assistance: None risk of falls: Patient Identification Verified: Yes Signs or symptoms of abuse/neglect since last No Secondary Verification Process Yes visito Completed: Hospitalized since last visit: No Patient Requires Transmission-Based No Pain Present Now: No Precautions: Patient Has Alerts: No Electronic Signature(s) Signed: 08/15/2017 11:56:31 AM By: Lorine Bears RCP, RRT, CHT Entered By: Lorine Bears on 08/15/2017 10:16:53 Leidy, [name] ([id number]) -------------------------------------------------------------------------------- Encounter Discharge Information Details Patient Name: [name]. Date of Service: 08/15/2017 10:15 AM Medical Record Number: [id number] Patient Account Number: [id number] Date of Birth/Sex: [birth year] (51 y.o. Female) Treating RN: Cornell Barman Primary Care Shunta Mclaurin: Delight Stare Other Clinician: Referring Karlene Southard: Delight Stare Treating Freddy Kinne/Extender: Melburn Hake, HOYT Weeks in Treatment: 29 Encounter Discharge Information Items Discharge Pain Level: 1 Discharge Condition: Stable Ambulatory Status: Ambulatory Discharge Destination: Home Transportation: Private Auto Accompanied By:  self Schedule Follow-up Appointment: Yes Medication Reconciliation completed and provided to Patient/Care Yes Tiah Heckel: Provided on Clinical Summary of Care: 08/15/2017 Form Type Recipient Paper Patient RM Electronic Signature(s) Signed: 08/18/2017 9:00:05 AM By: Ruthine Dose Entered By: Ruthine Dose on 08/15/2017 10:48:48 Lowy, Brianda S. ([id number]) -------------------------------------------------------------------------------- Lower Extremity Assessment Details Patient Name: [name]. Date of Service: 08/15/2017 10:15 AM Medical Record Number: [id number] Patient Account Number: [id number] Date of Birth/Sex: 1966/05/19 (51 y.o. Female) Treating RN: Cornell Barman Primary Care Adhira Jamil: Delight Stare Other Clinician: Referring Raj Landress: Delight Stare Treating Austyn Seier/Extender: Melburn Hake, HOYT Weeks in Treatment: 29 Vascular Assessment Pulses: Dorsalis Pedis Palpable: [Right:Yes] Posterior Tibial Extremity colors, hair growth, and conditions: Extremity Color: [Right:Normal] Hair Growth on Extremity: [Right:Yes] Temperature of Extremity: [Right:Warm] Capillary Refill: [Right:< 3 seconds] Electronic Signature(s) Signed: 08/15/2017 4:29:39 PM By: Gretta Cool, [name], RN, CWS, [name], [name] Entered By: Gretta Cool, [name], RN, CWS, Kim on 08/15/2017 10:36:38 Erisman, [name] ([id number]) -------------------------------------------------------------------------------- Multi Wound Chart Details Patient Name: [name]. Date of Service: 08/15/2017 10:15 AM Medical Record Number: [id number] Patient Account Number: [id number] Date of Birth/Sex: 05/27/1966 (51 y.o. Female) Treating RN: Cornell Barman Primary Care Kirklin Mcduffee: Delight Stare Other Clinician: Referring [name] [name]: Delight Stare Treating Alyxis Grippi/Extender: STONE III, HOYT Weeks in Treatment: 29 Vital Signs Height(in): 67 Pulse(bpm): 87 Weight(lbs): 137 Blood Pressure 107/53 (mmHg): Body Mass Index(BMI): 21 Temperature(F):  98.0 Respiratory Rate 16 (breaths/min): Photos: [1:No Photos] [2:No Photos] [N/A:N/A] Wound Location: [1:Right Malleolus - Lateral] [2:Right Gluteus] [N/A:N/A] Wounding Event: [1:Gradually Appeared] [2:Gradually Appeared] [N/A:N/A] Primary Etiology: [1:Diabetic Wound/Ulcer of the Lower Extremity] [2:Open Surgical Wound] [N/A:N/A] Comorbid History: [1:Anemia, Arrhythmia, Coronary Artery Disease, Hypotension, Peripheral Arterial Disease, Type II Diabetes, Osteoarthritis] [2:Anemia, Arrhythmia, Coronary Artery Disease, Hypotension, Peripheral Arterial Disease, Type II Diabetes,  Osteoarthritis] [N/A:N/A] Date Acquired: [1:09/23/2016] [2:09/18/2016] [N/A:N/A] Weeks of Treatment: [1:29] [2:29] [N/A:N/A] Wound Status: [1:Open] [2:Open] [N/A:N/A] Measurements L x W x D 1.2x1.1x0.4 [2:2.4x1x1.5] [N/A:N/A] (cm) Area (cm) : [1:1.037] [2:1.885] [N/A:N/A] Volume (cm) : [1:0.415] [2:2.827] [N/A:N/A] % Reduction in Area: [1:73.60%] [2:90.20%] [N/A:N/A] % Reduction  in Volume: 78.90% [2:97.10%] [N/A:N/A] Starting Position 1 12 (o'clock): Ending Position 1 [1:12] (o'clock): Maximum Distance 1 0.9 (cm): Undermining: [1:Yes] [2:N/A] [N/A:N/A] Classification: [1:Grade 2] [2:Full Thickness Without Exposed Support Structures] [N/A:N/A] Exudate Amount: [1:Large] [2:Large] [N/A:N/A] Exudate Type: Serous Serous N/A Exudate Color: amber amber N/A Foul Odor After No Yes N/A Cleansing: Odor Anticipated Due to N/A No N/A Product Use: Wound Margin: Distinct, outline attached Distinct, outline attached N/A Granulation Amount: Large (67-100%) Large (67-100%) N/A Granulation Quality: Pink Red, Pink N/A Necrotic Amount: Small (1-33%) None Present (0%) N/A Exposed Structures: Fat Layer (Subcutaneous Fat Layer (Subcutaneous N/A Tissue) Exposed: Yes Tissue) Exposed: Yes Fascia: No Fascia: No Tendon: No Tendon: No Muscle: No Muscle: No Joint: No Joint: No Bone: No Bone: No Epithelialization: None None  N/A Periwound Skin Texture: Induration: Yes No Abnormalities Noted N/A Excoriation: No Callus: No Crepitus: No Rash: No Scarring: No Periwound Skin Maceration: Yes Maceration: Yes N/A Moisture: Dry/Scaly: No Periwound Skin Color: Erythema: Yes Ecchymosis: Yes N/A Atrophie Blanche: No Cyanosis: No Ecchymosis: No Hemosiderin Staining: No Mottled: No Pallor: No Rubor: No Erythema Location: Circumferential N/A N/A Temperature: No Abnormality No Abnormality N/A Tenderness on Yes Yes N/A Palpation: Wound Preparation: Ulcer Cleansing: Ulcer Cleansing: N/A Rinsed/Irrigated with Rinsed/Irrigated with Saline Saline Topical Anesthetic Topical Anesthetic Applied: Other: lidocaine Applied: Other: lidocaine 4% 4% Treatment Notes Electronic Signature(s) REILLY, MOLCHAN ([id number]) Signed: 08/15/2017 4:29:39 PM By: Gretta Cool, [name], RN, CWS, [name], [name] Entered By: Gretta Cool, [name], RN, CWS, Kim on 08/15/2017 10:36:53 Ciolino, [name] ([id number]) -------------------------------------------------------------------------------- [place] Details Patient Name: SARALYNN, LANGHORST. Date of Service: 08/15/2017 10:15 AM Medical Record Number: [id number] Patient Account Number: [id number] Date of Birth/Sex: January 14, 1966 (51 y.o. Female) Treating RN: Cornell Barman Primary Care Maryl Blalock: Delight Stare Other Clinician: Referring Stuti Sandin: Delight Stare Treating Markitta Ausburn/Extender: Melburn Hake, HOYT Weeks in Treatment: 29 Active Inactive ` Orientation to the Wound Care Program Nursing Diagnoses: Knowledge deficit related to the wound healing center program Goals: Patient/caregiver will verbalize understanding of the [place] Program Date Initiated: 01/23/2017 Target Resolution Date: 05/25/2017 Goal Status: Active Interventions: Provide education on orientation to the wound center Notes: ` Peripheral Neuropathy Nursing Diagnoses: Knowledge deficit related to disease process and  management of peripheral neurovascular dysfunction Potential alteration in peripheral tissue perfusion (select prior to confirmation of diagnosis) Goals: Patient/caregiver will verbalize understanding of disease process and disease management Date Initiated: 01/23/2017 Target Resolution Date: 05/25/2017 Goal Status: Active Interventions: Assess signs and symptoms of neuropathy upon admission and as needed Provide education on Management of Neuropathy and Related Ulcers Provide education on Management of Neuropathy upon discharge from the [place] Treatment Activities: Test ordered outside of clinic : 01/23/2017 Notes: SHERANDA, SEABROOKS ([id number]) ` Pressure Nursing Diagnoses: Knowledge deficit related to causes and risk factors for pressure ulcer development Knowledge deficit related to management of pressures ulcers Potential for impaired tissue integrity related to pressure, friction, moisture, and shear Goals: Patient will remain free from development of additional pressure ulcers Date Initiated: 01/23/2017 Target Resolution Date: 05/25/2017 Goal Status: Active Patient will remain free of pressure ulcers Date Initiated: 01/23/2017 Target Resolution Date: 05/25/2017 Goal Status: Active Patient/caregiver will verbalize risk factors for pressure ulcer development Date Initiated: 01/23/2017 Target Resolution Date: 05/25/2017 Goal Status: Active Patient/caregiver will verbalize understanding of pressure ulcer management Date Initiated: 01/23/2017 Target Resolution Date: 05/25/2017 Goal Status: Active Interventions: Assess: immobility, friction, shearing, incontinence upon admission and as needed Assess offloading mechanisms upon admission and as needed Assess potential  for pressure ulcer upon admission and as needed Provide education on pressure ulcers Treatment Activities: Patient referred for pressure reduction/relief devices : 01/23/2017 Patient referred for seating evaluation to ensure  proper offloading : 01/23/2017 Pressure reduction/relief device ordered : 01/23/2017 Notes: ` Wound/Skin Impairment Nursing Diagnoses: Impaired tissue integrity Knowledge deficit related to smoking impact on wound healing Knowledge deficit related to ulceration/compromised skin integrity Goals: Patient/caregiver will verbalize understanding of skin care regimen ANUSHA, CLAUS ([id number]) Date Initiated: 01/23/2017 Target Resolution Date: 05/25/2017 Goal Status: Active Ulcer/skin breakdown will have a volume reduction of 30% by week 4 Date Initiated: 01/23/2017 Target Resolution Date: 05/25/2017 Goal Status: Active Ulcer/skin breakdown will have a volume reduction of 50% by week 8 Date Initiated: 01/23/2017 Target Resolution Date: 05/25/2017 Goal Status: Active Ulcer/skin breakdown will have a volume reduction of 80% by week 12 Date Initiated: 01/23/2017 Target Resolution Date: 05/25/2017 Goal Status: Active Ulcer/skin breakdown will heal within 14 weeks Date Initiated: 01/23/2017 Target Resolution Date: 05/25/2017 Goal Status: Active Interventions: Assess patient/caregiver ability to obtain necessary supplies Assess patient/caregiver ability to perform ulcer/skin care regimen upon admission and as needed Assess ulceration(s) every visit Provide education on smoking Provide education on ulcer and skin care Treatment Activities: Patient referred to home care : 01/23/2017 Referred to DME Saurav Crumble for dressing supplies : 01/23/2017 Skin care regimen initiated : 01/23/2017 Topical wound management initiated : 01/23/2017 Notes: Electronic Signature(s) Signed: 08/15/2017 4:29:39 PM By: Gretta Cool, [name], RN, CWS, [name], [name] Entered By: Gretta Cool, [name], RN, CWS, Kim on 08/15/2017 10:36:44 [place], [name] ([id number]) -------------------------------------------------------------------------------- Pain Assessment Details Patient Name: SYAN, CULLIMORE. Date of Service: 08/15/2017 10:15 AM Medical Record Number:  [id number] Patient Account Number: [id number] Date of Birth/Sex: 17-May-1966 (51 y.o. Female) Treating RN: Cornell Barman Primary Care Jocelyn Lowery: Delight Stare Other Clinician: Referring Zimal Weisensel: Delight Stare Treating Rani Idler/Extender: Melburn Hake, HOYT Weeks in Treatment: 29 Active Problems Location of Pain Severity and Description of Pain Patient Has Paino Yes Site Locations Rate the pain. Current Pain Level: 8 Worst Pain Level: 10 Least Pain Level: 7 Tolerable Pain Level: 7 Pain Management and Medication Current Pain Management: Electronic Signature(s) Signed: 08/15/2017 11:56:31 AM By: Lorine Bears RCP, RRT, CHT Signed: 08/15/2017 4:29:39 PM By: Gretta Cool, [name], RN, CWS, [name], [name] Entered By: Lorine Bears on 08/15/2017 10:17:20 [place], [name] ([id number]) -------------------------------------------------------------------------------- Patient/Caregiver Education Details Patient Name: [name]. Date of Service: 08/15/2017 10:15 AM Medical Record Number: [id number] Patient Account Number: [id number] Date of Birth/Gender: 08/17/1966 (51 y.o. Female) Treating RN: Cornell Barman Primary Care Physician: Delight Stare Other Clinician: Referring Physician: Delight Stare Treating Physician/Extender: Sharalyn Ink in Treatment: 69 Education Assessment Education Provided To: Patient Education Topics Provided Pressure: Methods: Demonstration Responses: State content correctly Wound/Skin Impairment: Handouts: Caring for Your Ulcer Methods: Demonstration Responses: State content correctly Electronic Signature(s) Signed: 08/15/2017 4:29:39 PM By: Gretta Cool, [name], RN, CWS, [name], [name] Entered By: Gretta Cool, [name], RN, CWS, Kim on 08/15/2017 10:45:54 Weitz, [name] ([id number]) -------------------------------------------------------------------------------- Wound Assessment Details Patient Name: KANESHIA, CATER. Date of Service: 08/15/2017 10:15 AM Medical  Record Number: [id number] Patient Account Number: [id number] Date of Birth/Sex: [birth year] (51 y.o. Female) Treating RN: Cornell Barman Primary Care Davontae Prusinski: Delight Stare Other Clinician: Referring Akirah Storck: Delight Stare Treating Darris Carachure/Extender: STONE III, HOYT Weeks in Treatment: 29 Wound Status Wound Number: 1 Primary Diabetic Wound/Ulcer of the Lower Etiology: Extremity Wound Location: Right Malleolus - Lateral Wound Open Wounding Event: Gradually Appeared Status: Date Acquired: 09/23/2016 Comorbid Anemia, Arrhythmia, Coronary  Artery Weeks Of Treatment: 29 History: Disease, Hypotension, Peripheral Clustered Wound: No Arterial Disease, Type II Diabetes, Osteoarthritis Photos Photo Uploaded By: Gretta Cool, [name], RN, CWS, Kim on 08/15/2017 14:25:40 Wound Measurements Length: (cm) 1.2 Width: (cm) 1.1 Depth: (cm) 0.4 Area: (cm) 1.037 Volume: (cm) 0.415 % Reduction in Area: 73.6% % Reduction in Volume: 78.9% Epithelialization: None Tunneling: No Undermining: Yes Starting Position (o'clock): 12 Ending Position (o'clock): 12 Maximum Distance: (cm) 0.9 Wound Description Classification: Grade 2 Foul Odor Aft Wound Margin: Distinct, outline attached Slough/Fibrin Exudate Amount: Large Exudate Type: Serous Exudate Color: amber er Cleansing: No o Yes Wound Bed Granulation Amount: Large (67-100%) Exposed Structure Gores, Nefertari S. ([id number]) Granulation Quality: Pink Fascia Exposed: No Necrotic Amount: Small (1-33%) Fat Layer (Subcutaneous Tissue) Exposed: Yes Necrotic Quality: Adherent Slough Tendon Exposed: No Muscle Exposed: No Joint Exposed: No Bone Exposed: No Periwound Skin Texture Texture Color No Abnormalities Noted: No No Abnormalities Noted: No Callus: No Atrophie Blanche: No Crepitus: No Cyanosis: No Excoriation: No Ecchymosis: No Induration: Yes Erythema: Yes Rash: No Erythema Location: Circumferential Scarring: No Hemosiderin Staining:  No Mottled: No Moisture Pallor: No No Abnormalities Noted: No Rubor: No Dry / Scaly: No Maceration: Yes Temperature / Pain Temperature: No Abnormality Tenderness on Palpation: Yes Wound Preparation Ulcer Cleansing: Rinsed/Irrigated with Saline Topical Anesthetic Applied: Other: lidocaine 4%, Treatment Notes Wound #1 (Right, Lateral Malleolus) 1. Cleansed with: Clean wound with Normal Saline 2. Anesthetic Topical Lidocaine 4% cream to wound bed prior to debridement 3. Peri-wound Care: Skin Prep 4. Dressing Applied: Aquacel Ag 5. Secondary Dressing Applied Bordered Foam Dressing Dry Gauze Electronic Signature(s) Signed: 08/15/2017 4:29:39 PM By: Gretta Cool, [name], RN, CWS, [name], [name] Entered By: Gretta Cool, [name], RN, CWS, Kim on 08/15/2017 10:31:00 Goldner, [name] ([id number]) -------------------------------------------------------------------------------- Wound Assessment Details Patient Name: MELESSA, COWELL. Date of Service: 08/15/2017 10:15 AM Medical Record Number: [id number] Patient Account Number: [id number] Date of Birth/Sex: Apr 19, 1966 (51 y.o. Female) Treating RN: Cornell Barman Primary Care Tunya Held: Delight Stare Other Clinician: Referring Ignacio Lowder: Delight Stare Treating Shanina Kepple/Extender: Melburn Hake, HOYT Weeks in Treatment: 29 Wound Status Wound Number: 2 Primary Open Surgical Wound Etiology: Wound Location: Right Gluteus Wound Open Wounding Event: Gradually Appeared Status: Date Acquired: 09/18/2016 Comorbid Anemia, Arrhythmia, Coronary Artery Weeks Of Treatment: 29 History: Disease, Hypotension, Peripheral Clustered Wound: No Arterial Disease, Type II Diabetes, Osteoarthritis Photos Photo Uploaded By: Gretta Cool, [name], RN, CWS, Kim on 08/15/2017 14:25:40 Wound Measurements Length: (cm) 2.4 Width: (cm) 1 Depth: (cm) 1.5 Area: (cm) 1.885 Volume: (cm) 2.827 % Reduction in Area: 90.2% % Reduction in Volume: 97.1% Epithelialization: None Tunneling: No Wound  Description Full Thickness Without Exposed Classification: Support Structures Wound Margin: Distinct, outline attached Exudate Large Amount: Exudate Type: Serous Exudate Color: amber Foul Odor After Cleansing: Yes Due to Product Use: No Slough/Fibrino Yes Wound Bed Granulation Amount: Large (67-100%) Exposed Structure Granulation Quality: Red, Pink Fascia Exposed: No Necrotic Amount: None Present (0%) Fat Layer (Subcutaneous Tissue) Exposed: Yes Biever, Marisel S. ([id number]) Tendon Exposed: No Muscle Exposed: No Joint Exposed: No Bone Exposed: No Periwound Skin Texture Texture Color No Abnormalities Noted: No No Abnormalities Noted: No Ecchymosis: Yes Moisture No Abnormalities Noted: No Temperature / Pain Maceration: Yes Temperature: No Abnormality Tenderness on Palpation: Yes Wound Preparation Ulcer Cleansing: Rinsed/Irrigated with Saline Topical Anesthetic Applied: Other: lidocaine 4%, Treatment Notes Wound #2 (Right Gluteus) 1. Cleansed with: Clean wound with Normal Saline 2. Anesthetic Topical Lidocaine 4% cream to wound bed prior to debridement 3. Peri-wound Care: Skin Prep 4.  Dressing Applied: Aquacel Ag 5. Secondary Dressing Applied Bordered Foam Dressing Dry Gauze Electronic Signature(s) Signed: 08/15/2017 4:29:39 PM By: Gretta Cool, [name], RN, CWS, [name], [name] Entered By: Gretta Cool, [name], RN, CWS, Kim on 08/15/2017 10:34:02 Lenis, [name] ([id number]) -------------------------------------------------------------------------------- Vitals Details Patient Name: DOLCE, SYLVIA. Date of Service: 08/15/2017 10:15 AM Medical Record Number: [id number] Patient Account Number: [id number] Date of Birth/Sex: November 22, 1965 (51 y.o. Female) Treating RN: Cornell Barman Primary Care Arnola Crittendon: Delight Stare Other Clinician: Referring Reshma Hoey: Delight Stare Treating Cheria Sadiq/Extender: Melburn Hake, HOYT Weeks in Treatment: 29 Vital Signs Time Taken: 10:18 Temperature (F):  98.0 Height (in): 67 Pulse (bpm): 87 Weight (lbs): 137 Respiratory Rate (breaths/min): 16 Body Mass Index (BMI): 21.5 Blood Pressure (mmHg): 107/53 Reference Range: 80 - 120 mg / dl Electronic Signature(s) Signed: 08/15/2017 11:56:31 AM By: Lorine Bears RCP, RRT, CHT Entered By: Lorine Bears on 08/15/2017 10:19:11

## 2017-08-29 ENCOUNTER — Encounter: Payer: Medicare Other | Attending: Surgery | Admitting: Surgery

## 2017-08-29 DIAGNOSIS — L97312 Non-pressure chronic ulcer of right ankle with fat layer exposed: Secondary | ICD-10-CM | POA: Diagnosis not present

## 2017-08-29 DIAGNOSIS — E11621 Type 2 diabetes mellitus with foot ulcer: Secondary | ICD-10-CM | POA: Diagnosis not present

## 2017-08-29 DIAGNOSIS — I70233 Atherosclerosis of native arteries of right leg with ulceration of ankle: Secondary | ICD-10-CM | POA: Diagnosis not present

## 2017-08-29 DIAGNOSIS — E1140 Type 2 diabetes mellitus with diabetic neuropathy, unspecified: Secondary | ICD-10-CM | POA: Insufficient documentation

## 2017-08-29 DIAGNOSIS — L89314 Pressure ulcer of right buttock, stage 4: Secondary | ICD-10-CM | POA: Diagnosis not present

## 2017-08-29 DIAGNOSIS — M86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, right ankle and foot: Secondary | ICD-10-CM | POA: Insufficient documentation

## 2017-08-29 DIAGNOSIS — E1151 Type 2 diabetes mellitus with diabetic peripheral angiopathy without gangrene: Secondary | ICD-10-CM | POA: Insufficient documentation

## 2017-08-29 DIAGNOSIS — F17218 Nicotine dependence, cigarettes, with other nicotine-induced disorders: Secondary | ICD-10-CM | POA: Insufficient documentation

## 2017-08-31 NOTE — Progress Notes (Signed)
[name], [name] ([id number]) Visit Report for 08/29/2017 Arrival Information Details Patient Name: [name], [name]. Date of Service: 08/29/2017 1:45 PM Medical Record Number: [id number] Patient Account Number: [id number] Date of Birth/Sex: [birth year] (51 y.o. Female) Treating RN: Cornell Barman Primary Care Doniesha Landau: Delight Stare Other Clinician: Referring Yanice Maqueda: Delight Stare Treating Jakirah Zaun/Extender: Frann Rider in Treatment: 35 Visit Information History Since Last Visit Added or deleted any medications: No Patient Arrived: Ambulatory Any new allergies or adverse reactions: No Arrival Time: 13:58 Had a fall or experienced change in No Accompanied By: self activities of daily living that may affect Transfer Assistance: None risk of falls: Patient Identification Verified: Yes Signs or symptoms of abuse/neglect since last No Secondary Verification Process Yes visito Completed: Hospitalized since last visit: No Patient Requires Transmission-Based No Has Dressing in Place as Prescribed: Yes Precautions: Pain Present Now: Yes Patient Has Alerts: No Electronic Signature(s) Signed: 08/29/2017 4:29:15 PM By: Gretta Cool, [name], RN, CWS, [name], [name] Entered By: Gretta Cool, [name], RN, CWS, Kim on 08/29/2017 13:59:22 Wisser, [name] ([id number]) -------------------------------------------------------------------------------- Clinic Level of Care Assessment Details Patient Name: [name], [name]. Date of Service: 08/29/2017 1:45 PM Medical Record Number: [id number] Patient Account Number: [id number] Date of Birth/Sex: 12-29-1965 (51 y.o. Female) Treating RN: Cornell Barman Primary Care Anniece Bleiler: Delight Stare Other Clinician: Referring Marchelle Rinella: Delight Stare Treating Shalena Ezzell/Extender: Frann Rider in Treatment: 31 Clinic Level of Care Assessment Items TOOL 4 Quantity Score []  - Use when only an EandM is performed on FOLLOW-UP visit 0 ASSESSMENTS - Nursing Assessment /  Reassessment []  - Reassessment of Co-morbidities (includes updates in patient status) 0 X - Reassessment of Adherence to Treatment Plan 1 5 ASSESSMENTS - Wound and Skin Assessment / Reassessment []  - Simple Wound Assessment / Reassessment - one wound 0 X - Complex Wound Assessment / Reassessment - multiple wounds 2 5 []  - Dermatologic / Skin Assessment (not related to wound area) 0 ASSESSMENTS - Focused Assessment []  - Circumferential Edema Measurements - multi extremities 0 []  - Nutritional Assessment / Counseling / Intervention 0 []  - Lower Extremity Assessment (monofilament, tuning fork, pulses) 0 []  - Peripheral Arterial Disease Assessment (using hand held doppler) 0 ASSESSMENTS - Ostomy and/or Continence Assessment and Care []  - Incontinence Assessment and Management 0 []  - Ostomy Care Assessment and Management (repouching, etc.) 0 PROCESS - Coordination of Care []  - Simple Patient / Family Education for ongoing care 0 X - Complex (extensive) Patient / Family Education for ongoing care 1 20 X - Staff obtains Programmer, systems, Records, Test Results / Process Orders 1 10 []  - Staff telephones HHA, Nursing Homes / Clarify orders / etc 0 []  - Routine Transfer to another Facility (non-emergent condition) 0 Curt, Jasnoor S. ([id number]) []  - Routine Hospital Admission (non-emergent condition) 0 []  - New Admissions / Biomedical engineer / Ordering NPWT, Apligraf, etc. 0 []  - Emergency Hospital Admission (emergent condition) 0 X - Simple Discharge Coordination 1 10 []  - Complex (extensive) Discharge Coordination 0 PROCESS - Special Needs []  - Pediatric / Minor Patient Management 0 []  - Isolation Patient Management 0 []  - Hearing / Language / Visual special needs 0 []  - Assessment of Community assistance (transportation, D/C planning, etc.) 0 []  - Additional assistance / Altered mentation 0 []  - Support Surface(s) Assessment (bed, cushion, seat, etc.) 0 INTERVENTIONS - Wound Cleansing /  Measurement []  - Simple Wound Cleansing - one wound 0 X - Complex Wound Cleansing - multiple wounds 2 5 X - Wound Imaging (photographs - any  number of wounds) 1 5 []  - Wound Tracing (instead of photographs) 0 []  - Simple Wound Measurement - one wound 0 X - Complex Wound Measurement - multiple wounds 2 5 INTERVENTIONS - Wound Dressings []  - Small Wound Dressing one or multiple wounds 0 X - Medium Wound Dressing one or multiple wounds 2 15 []  - Large Wound Dressing one or multiple wounds 0 []  - Application of Medications - topical 0 []  - Application of Medications - injection 0 INTERVENTIONS - Miscellaneous []  - External ear exam 0 Gallon, Shiori S. ([id number]) []  - Specimen Collection (cultures, biopsies, blood, body fluids, etc.) 0 []  - Specimen(s) / Culture(s) sent or taken to Lab for analysis 0 []  - Patient Transfer (multiple staff / Harrel Lemon Lift / Similar devices) 0 []  - Simple Staple / Suture removal (25 or less) 0 []  - Complex Staple / Suture removal (26 or more) 0 []  - Hypo / Hyperglycemic Management (close monitor of Blood Glucose) 0 []  - Ankle / Brachial Index (ABI) - do not check if billed separately 0 X - Vital Signs 1 5 Has the patient been seen at the hospital within the last three years: Yes Total Score: 115 Level Of Care: New/Established - Level 3 Electronic Signature(s) Signed: 08/29/2017 4:29:15 PM By: Gretta Cool, [name], RN, CWS, [name], [name] Entered By: Gretta Cool, [name], RN, CWS, Kim on 08/29/2017 14:31:06 Rheaume, [name] ([id number]) -------------------------------------------------------------------------------- Encounter Discharge Information Details Patient Name: [name], [name]. Date of Service: 08/29/2017 1:45 PM Medical Record Number: [id number] Patient Account Number: [id number] Date of Birth/Sex: August 26, 1966 (51 y.o. Female) Treating RN: Cornell Barman Primary Care Mirranda Monrroy: Delight Stare Other Clinician: Referring Anthony Tamburo: Delight Stare Treating Gaynell Eggleton/Extender: Frann Rider in Treatment: 38 Encounter Discharge Information Items Discharge Pain Level: 0 Discharge Condition: Stable Ambulatory Status: Ambulatory Discharge Destination: Home Transportation: Private Auto Accompanied By: self Schedule Follow-up Appointment: Yes Medication Reconciliation completed and provided to Patient/Care Yes Uzair Godley: Provided on Clinical Summary of Care: 08/29/2017 Form Type Recipient Paper Patient RM Electronic Signature(s) Signed: 08/29/2017 4:29:15 PM By: Gretta Cool, [name], RN, CWS, [name], [name] Entered By: Gretta Cool, [name], RN, CWS, Kim on 08/29/2017 14:31:56 Roeper, [name] ([id number]) -------------------------------------------------------------------------------- Lower Extremity Assessment Details Patient Name: [name], [name]. Date of Service: 08/29/2017 1:45 PM Medical Record Number: [id number] Patient Account Number: [id number] Date of Birth/Sex: [birth year] (51 y.o. Female) Treating RN: Cornell Barman Primary Care Akshith Moncus: Delight Stare Other Clinician: Referring Josilyn Shippee: Delight Stare Treating Unknown Flannigan/Extender: Frann Rider in Treatment: 31 Vascular Assessment Pulses: Dorsalis Pedis Palpable: [Right:Yes] Posterior Tibial Extremity colors, hair growth, and conditions: Extremity Color: [Right:Normal] Hair Growth on Extremity: [Right:No] Temperature of Extremity: [Right:Warm] Capillary Refill: [Right:< 3 seconds] Electronic Signature(s) Signed: 08/29/2017 4:29:15 PM By: Gretta Cool, [name], RN, CWS, [name], [name] Entered By: Gretta Cool, [name], RN, CWS, Kim on 08/29/2017 14:08:25 Strupp, [name] ([id number]) -------------------------------------------------------------------------------- Multi Wound Chart Details Patient Name: [name] Date of Service: 08/29/2017 1:45 PM Medical Record Number: [id number] Patient Account Number: [id number] Date of Birth/Sex: 1966-05-14 (51 y.o. Female) Treating RN: Cornell Barman Primary Care Ashden Sonnenberg: Delight Stare Other  Clinician: Referring Ellizabeth Dacruz: Delight Stare Treating Laurren Lepkowski/Extender: Frann Rider in Treatment: 31 Vital Signs Height(in): 67 Pulse(bpm): 79 Weight(lbs): 137 Blood Pressure 104/57 (mmHg): Body Mass Index(BMI): 21 Temperature(F): 98.2 Respiratory Rate 16 (breaths/min): Photos: [1:No Photos] [2:No Photos] [N/A:N/A] Wound Location: [1:Right Malleolus - Lateral] [2:Right Gluteus] [N/A:N/A] Wounding Event: [1:Gradually Appeared] [2:Gradually Appeared] [N/A:N/A] Primary Etiology: [1:Diabetic Wound/Ulcer of the Lower Extremity] [2:Open Surgical Wound] [N/A:N/A] Comorbid History: [1:Anemia,  Arrhythmia, Coronary Artery Disease, Hypotension, Peripheral Arterial Disease, Type II Diabetes, Osteoarthritis] [2:Anemia, Arrhythmia, Coronary Artery Disease, Hypotension, Peripheral Arterial Disease, Type II Diabetes,  Osteoarthritis] [N/A:N/A] Date Acquired: [1:09/23/2016] [2:09/18/2016] [N/A:N/A] Weeks of Treatment: [1:31] [2:31] [N/A:N/A] Wound Status: [1:Open] [2:Open] [N/A:N/A] Measurements L x W x D 1.5x1.8x0.4 [2:2x1x1.2] [N/A:N/A] (cm) Area (cm) : [1:2.121] [2:1.571] [N/A:N/A] Volume (cm) : [1:0.848] [2:1.885] [N/A:N/A] % Reduction in Area: [1:46.00%] [2:91.80%] [N/A:N/A] % Reduction in Volume: 56.80% [2:98.00%] [N/A:N/A] Starting Position 1 12 (o'clock): Ending Position 1 [1:12] (o'clock): Maximum Distance 1 0.4 (cm): Undermining: [1:Yes] [2:No] [N/A:N/A] Classification: [1:Grade 2] [2:Full Thickness Without Exposed Support Structures] [N/A:N/A] Exudate Amount: [1:Large] [2:Large] [N/A:N/A] Exudate Type: Serous Serous N/A Exudate Color: amber amber N/A Foul Odor After No Yes N/A Cleansing: Odor Anticipated Due to N/A No N/A Product Use: Wound Margin: Distinct, outline attached Distinct, outline attached N/A Granulation Amount: Large (67-100%) Small (1-33%) N/A Granulation Quality: Pink Red, Pink N/A Necrotic Amount: Small (1-33%) Large (67-100%) N/A Exposed  Structures: Fat Layer (Subcutaneous Fat Layer (Subcutaneous N/A Tissue) Exposed: Yes Tissue) Exposed: Yes Fascia: No Fascia: No Tendon: No Tendon: No Muscle: No Muscle: No Joint: No Joint: No Bone: No Bone: No Epithelialization: None None N/A Periwound Skin Texture: Induration: Yes No Abnormalities Noted N/A Excoriation: No Callus: No Crepitus: No Rash: No Scarring: No Periwound Skin Maceration: Yes Maceration: Yes N/A Moisture: Dry/Scaly: No Periwound Skin Color: Erythema: Yes Ecchymosis: Yes N/A Atrophie Blanche: No Cyanosis: No Ecchymosis: No Hemosiderin Staining: No Mottled: No Pallor: No Rubor: No Erythema Location: Circumferential N/A N/A Temperature: No Abnormality No Abnormality N/A Tenderness on Yes Yes N/A Palpation: Wound Preparation: Ulcer Cleansing: Ulcer Cleansing: N/A Rinsed/Irrigated with Rinsed/Irrigated with Saline Saline Topical Anesthetic Topical Anesthetic Applied: Other: lidocaine Applied: Other: lidocaine 4% 4% Treatment Notes Wound #1 (Right, Lateral Malleolus) 1. Cleansed with: Wiesen, Naziah S. ([id number]) Clean wound with Normal Saline 2. Anesthetic Topical Lidocaine 4% cream to wound bed prior to debridement 3. Peri-wound Care: Skin Prep 4. Dressing Applied: Other dressing (specify in notes) 5. Secondary Dressing Applied Bordered Foam Dressing Dry Gauze Notes [facility] Wound #2 (Right Gluteus) 1. Cleansed with: Clean wound with Normal Saline 2. Anesthetic Topical Lidocaine 4% cream to wound bed prior to debridement 3. Peri-wound Care: Skin Prep 4. Dressing Applied: Other dressing (specify in notes) 5. Secondary Dressing Applied Bordered Foam Dressing Dry Gauze Notes [facility] Electronic Signature(s) Signed: 08/29/2017 2:32:20 PM By: Christin Fudge MD, FACS Entered By: Christin Fudge on 08/29/2017 14:32:20 Aguado, [name]  ([id number]) -------------------------------------------------------------------------------- [place] Details Patient Name: [name], [name]. Date of Service: 08/29/2017 1:45 PM Medical Record Number: [id number] Patient Account Number: [id number] Date of Birth/Sex: 05-21-1966 (51 y.o. Female) Treating RN: Cornell Barman Primary Care Jamarian Jacinto: Delight Stare Other Clinician: Referring Cornelious Diven: Delight Stare Treating Leler Brion/Extender: Frann Rider in Treatment: 31 Active Inactive ` Orientation to the Wound Care Program Nursing Diagnoses: Knowledge deficit related to the wound healing center program Goals: Patient/caregiver will verbalize understanding of the [place] Program Date Initiated: 01/23/2017 Target Resolution Date: 05/25/2017 Goal Status: Active Interventions: Provide education on orientation to the wound center Notes: ` Peripheral Neuropathy Nursing Diagnoses: Knowledge deficit related to disease process and management of peripheral neurovascular dysfunction Potential alteration in peripheral tissue perfusion (select prior to confirmation of diagnosis) Goals: Patient/caregiver will verbalize understanding of disease process and disease management Date Initiated: 01/23/2017 Target Resolution Date: 05/25/2017 Goal Status: Active Interventions: Assess signs and symptoms of neuropathy upon admission and as needed Provide education on Management  of Neuropathy and Related Ulcers Provide education on Management of Neuropathy upon discharge from the [facility]: Test ordered outside of clinic : 01/23/2017 Notes: [name], [name] ([id number]) ` Pressure Nursing Diagnoses: Knowledge deficit related to causes and risk factors for pressure ulcer development Knowledge deficit related to management of pressures ulcers Potential for impaired tissue integrity related to pressure, friction, moisture, and shear Goals: Patient will  remain free from development of additional pressure ulcers Date Initiated: 01/23/2017 Target Resolution Date: 05/25/2017 Goal Status: Active Patient will remain free of pressure ulcers Date Initiated: 01/23/2017 Target Resolution Date: 05/25/2017 Goal Status: Active Patient/caregiver will verbalize risk factors for pressure ulcer development Date Initiated: 01/23/2017 Target Resolution Date: 05/25/2017 Goal Status: Active Patient/caregiver will verbalize understanding of pressure ulcer management Date Initiated: 01/23/2017 Target Resolution Date: 05/25/2017 Goal Status: Active Interventions: Assess: immobility, friction, shearing, incontinence upon admission and as needed Assess offloading mechanisms upon admission and as needed Assess potential for pressure ulcer upon admission and as needed Provide education on pressure ulcers Treatment Activities: Patient referred for pressure reduction/relief devices : 01/23/2017 Patient referred for seating evaluation to ensure proper offloading : 01/23/2017 Pressure reduction/relief device ordered : 01/23/2017 Notes: ` Wound/Skin Impairment Nursing Diagnoses: Impaired tissue integrity Knowledge deficit related to smoking impact on wound healing Knowledge deficit related to ulceration/compromised skin integrity Goals: Patient/caregiver will verbalize understanding of skin care regimen [name], [name] ([id number]) Date Initiated: 01/23/2017 Target Resolution Date: 05/25/2017 Goal Status: Active Ulcer/skin breakdown will have a volume reduction of 30% by week 4 Date Initiated: 01/23/2017 Target Resolution Date: 05/25/2017 Goal Status: Active Ulcer/skin breakdown will have a volume reduction of 50% by week 8 Date Initiated: 01/23/2017 Target Resolution Date: 05/25/2017 Goal Status: Active Ulcer/skin breakdown will have a volume reduction of 80% by week 12 Date Initiated: 01/23/2017 Target Resolution Date: 05/25/2017 Goal Status: Active Ulcer/skin breakdown will heal  within 14 weeks Date Initiated: 01/23/2017 Target Resolution Date: 05/25/2017 Goal Status: Active Interventions: Assess patient/caregiver ability to obtain necessary supplies Assess patient/caregiver ability to perform ulcer/skin care regimen upon admission and as needed Assess ulceration(s) every visit Provide education on smoking Provide education on ulcer and skin care Treatment Activities: Patient referred to home care : 01/23/2017 Referred to DME Annalucia Laino for dressing supplies : 01/23/2017 Skin care regimen initiated : 01/23/2017 Topical wound management initiated : 01/23/2017 Notes: Electronic Signature(s) Signed: 08/29/2017 4:29:15 PM By: Gretta Cool, [name], RN, CWS, [name], [name] Entered By: Gretta Cool, [name], RN, CWS, Kim on 08/29/2017 14:14:43 Mom, [name] ([id number]) -------------------------------------------------------------------------------- Pain Assessment Details Patient Name: DEZZIE, BADILLA. Date of Service: 08/29/2017 1:45 PM Medical Record Number: [id number] Patient Account Number: [id number] Date of Birth/Sex: 02-Sep-1966 (51 y.o. Female) Treating RN: Cornell Barman Primary Care Tannor Pyon: Delight Stare Other Clinician: Referring Bernadett Milian: Delight Stare Treating [place] Luppino/Extender: Frann Rider in Treatment: 31 Active Problems Location of Pain Severity and Description of Pain Patient Has Paino Yes Site Locations Pain Location: Generalized Pain With Dressing Change: No Duration of the Pain. Constant / Intermittento Constant Rate the pain. Current Pain Level: 10 Pain Management and Medication Current Pain Management: Goals for Pain Management Patient states she hurts all over and just too a pain pill. Electronic Signature(s) Signed: 08/29/2017 4:29:15 PM By: Gretta Cool, [name], RN, CWS, [name], [name] Entered By: Gretta Cool, [name], RN, CWS, Kim on 08/29/2017 14:00:12 [place], [name]  ([id number]) -------------------------------------------------------------------------------- Patient/Caregiver Education Details Patient Name: [name], [name]. Date of Service: 08/29/2017 1:45 PM Medical Record Number: [id number] Patient Account Number: [id number] Date of  Birth/Gender: 30-Nov-1965 (51 y.o. Female) Treating RN: Cornell Barman Primary Care Physician: Delight Stare Other Clinician: Referring Physician: Delight Stare Treating Physician/Extender: Frann Rider in Treatment: 76 Education Assessment Education Provided To: Patient Education Topics Provided Pressure: Handouts: Preventing Pressure Ulcers Methods: Demonstration, Explain/Verbal Responses: State content correctly Wound/Skin Impairment: Handouts: Caring for Your Ulcer Methods: Demonstration, Explain/Verbal Responses: State content correctly Electronic Signature(s) Signed: 08/29/2017 4:29:15 PM By: Gretta Cool, [name], RN, CWS, [name], [name] Entered By: Gretta Cool, [name], RN, CWS, Kim on 08/29/2017 14:32:24 Viviani, [name] ([id number]) -------------------------------------------------------------------------------- Wound Assessment Details Patient Name: [name], [name]. Date of Service: 08/29/2017 1:45 PM Medical Record Number: [id number] Patient Account Number: [id number] Date of Birth/Sex: 09/11/1966 (51 y.o. Female) Treating RN: Cornell Barman Primary Care Emmalyne Giacomo: Delight Stare Other Clinician: Referring Wassim Kirksey: Delight Stare Treating Daneka Lantigua/Extender: Frann Rider in Treatment: 31 Wound Status Wound Number: 1 Primary Diabetic Wound/Ulcer of the Lower Etiology: Extremity Wound Location: Right Malleolus - Lateral Wound Open Wounding Event: Gradually Appeared Status: Date Acquired: 09/23/2016 Comorbid Anemia, Arrhythmia, Coronary Artery Weeks Of Treatment: 31 History: Disease, Hypotension, Peripheral Clustered Wound: No Arterial Disease, Type II Diabetes, Osteoarthritis Photos Photo Uploaded By: Gretta Cool, [name],  RN, CWS, Kim on 08/29/2017 14:40:26 Wound Measurements Length: (cm) 1.5 Width: (cm) 1.8 Depth: (cm) 0.4 Area: (cm) 2.121 Volume: (cm) 0.848 % Reduction in Area: 46% % Reduction in Volume: 56.8% Epithelialization: None Tunneling: No Undermining: Yes Starting Position (o'clock): 12 Ending Position (o'clock): 12 Maximum Distance: (cm) 0.4 Wound Description Classification: Grade 2 Foul Odor Aft Wound Margin: Distinct, outline attached Slough/Fibrin Exudate Amount: Large Exudate Type: Serous Exudate Color: amber er Cleansing: No o Yes Wound Bed Granulation Amount: Large (67-100%) Exposed Structure Star, Rashae S. ([id number]) Granulation Quality: Pink Fascia Exposed: No Necrotic Amount: Small (1-33%) Fat Layer (Subcutaneous Tissue) Exposed: Yes Necrotic Quality: Adherent Slough Tendon Exposed: No Muscle Exposed: No Joint Exposed: No Bone Exposed: No Periwound Skin Texture Texture Color No Abnormalities Noted: No No Abnormalities Noted: No Callus: No Atrophie Blanche: No Crepitus: No Cyanosis: No Excoriation: No Ecchymosis: No Induration: Yes Erythema: Yes Rash: No Erythema Location: Circumferential Scarring: No Hemosiderin Staining: No Mottled: No Moisture Pallor: No No Abnormalities Noted: No Rubor: No Dry / Scaly: No Maceration: Yes Temperature / Pain Temperature: No Abnormality Tenderness on Palpation: Yes Wound Preparation Ulcer Cleansing: Rinsed/Irrigated with Saline Topical Anesthetic Applied: Other: lidocaine 4%, Treatment Notes Wound #1 (Right, Lateral Malleolus) 1. Cleansed with: Clean wound with Normal Saline 2. Anesthetic Topical Lidocaine 4% cream to wound bed prior to debridement 3. Peri-wound Care: Skin Prep 4. Dressing Applied: Other dressing (specify in notes) 5. Secondary Dressing Applied Bordered Foam Dressing Dry Gauze Notes [facility] Electronic Signature(s) Signed: 08/29/2017 4:29:15 PM By: Gretta Cool, [name], RN, CWS, Kim  RN, [name] Entered By: Gretta Cool, [name], RN, CWS, Kim on 08/29/2017 14:07:15 Kincaid, [name] ([id number], WURTZEL ([id number]) -------------------------------------------------------------------------------- Wound Assessment Details Patient Name: GHISLAINE, HARCUM. Date of Service: 08/29/2017 1:45 PM Medical Record Number: [id number] Patient Account Number: [id number] Date of Birth/Sex: Feb 28, 1966 (51 y.o. Female) Treating RN: Cornell Barman Primary Care Saya Mccoll: Delight Stare Other Clinician: Referring Taleigha Pinson: Delight Stare Treating Merick Kelleher/Extender: Frann Rider in Treatment: 31 Wound Status Wound Number: 2 Primary Open Surgical Wound Etiology: Wound Location: Right Gluteus Wound Open Wounding Event: Gradually Appeared Status: Date Acquired: 09/18/2016 Comorbid Anemia, Arrhythmia, Coronary Artery Weeks Of Treatment: 31 History: Disease, Hypotension, Peripheral Clustered Wound: No Arterial Disease, Type II Diabetes, Osteoarthritis Photos Photo Uploaded By: Gretta Cool, [name], RN, CWS, Kim on 08/29/2017 14:40:26  Wound Measurements Length: (cm) 2 Width: (cm) 1 Depth: (cm) 1.2 Area: (cm) 1.571 Volume: (cm) 1.885 % Reduction in Area: 91.8% % Reduction in Volume: 98% Epithelialization: None Tunneling: No Undermining: No Wound Description Full Thickness Without Exposed Classification: Support Structures Wound Margin: Distinct, outline attached Exudate Large Amount: Exudate Type: Serous Exudate Color: amber Foul Odor After Cleansing: Yes Due to Product Use: No Slough/Fibrino Yes Wound Bed Granulation Amount: Small (1-33%) Exposed Structure Granulation Quality: Red, Pink Fascia Exposed: No Necrotic Amount: Large (67-100%) Fat Layer (Subcutaneous Tissue) Exposed: Yes Kutch, Malachi S. ([id number]) Necrotic Quality: Adherent Slough Tendon Exposed: No Muscle Exposed: No Joint Exposed: No Bone Exposed: No Periwound Skin Texture Texture Color No Abnormalities Noted:  No No Abnormalities Noted: No Ecchymosis: Yes Moisture No Abnormalities Noted: No Temperature / Pain Maceration: Yes Temperature: No Abnormality Tenderness on Palpation: Yes Wound Preparation Ulcer Cleansing: Rinsed/Irrigated with Saline Topical Anesthetic Applied: Other: lidocaine 4%, Treatment Notes Wound #2 (Right Gluteus) 1. Cleansed with: Clean wound with Normal Saline 2. Anesthetic Topical Lidocaine 4% cream to wound bed prior to debridement 3. Peri-wound Care: Skin Prep 4. Dressing Applied: Other dressing (specify in notes) 5. Secondary Dressing Applied Bordered Foam Dressing Dry Gauze Notes [facility] Electronic Signature(s) Signed: 08/29/2017 4:29:15 PM By: Gretta Cool, [name], RN, CWS, [name], [name] Entered By: Gretta Cool, [name], RN, CWS, Kim on 08/29/2017 14:07:54 [place], [name] ([id number]) -------------------------------------------------------------------------------- [place] Details Patient Name: ELEORA, SUTHERLAND. Date of Service: 08/29/2017 1:45 PM Medical Record Number: [id number] Patient Account Number: [id number] Date of Birth/Sex: 10/13/1966 (51 y.o. Female) Treating RN: Cornell Barman Primary Care [place] Mielke: Delight Stare Other Clinician: Referring Porter Moes: Delight Stare Treating Tahsin Benyo/Extender: Frann Rider in Treatment: 31 Vital Signs Time Taken: 14:00 Temperature (F): 98.2 Height (in): 67 Pulse (bpm): 79 Weight (lbs): 137 Respiratory Rate (breaths/min): 16 Body Mass Index (BMI): 21.5 Blood Pressure (mmHg): 104/57 Reference Range: 80 - 120 mg / dl Electronic Signature(s) Signed: 08/29/2017 4:29:15 PM By: Gretta Cool, [name], RN, CWS, [name], [name] Entered By: Gretta Cool, [name], RN, CWS, Kim on 08/29/2017 14:00:37

## 2017-09-01 NOTE — Progress Notes (Addendum)
JASLEN, ADCOX ([id number]) Visit Report for 08/29/2017 Chief Complaint Document Details Patient Name: [name], [name]. Date of Service: 08/29/2017 1:45 PM Medical Record Number: [id number] Patient Account Number: [id number] Date of Birth/Sex: Apr 24, 1966 (51 y.o. Female) Treating RN: Cornell Barman Primary Care Provider: Delight Stare Other Clinician: Referring Provider: Delight Stare Treating Provider/Extender: Frann Rider in Treatment: 31 Information Obtained from: Patient Chief Complaint She is here in follow up evaluation for right ischial and right lateral malleolus Electronic Signature(s) Signed: 08/29/2017 2:32:27 PM By: Christin Fudge MD, FACS Entered By: Christin Fudge on 08/29/2017 14:32:27 Macdonell, Yohanna Chauncey Cruel ([id number]) -------------------------------------------------------------------------------- HPI Details Patient Name: [name], [name] Date of Service: 08/29/2017 1:45 PM Medical Record Number: [id number] Patient Account Number: [id number] Date of Birth/Sex: Mar 03, 1966 (51 y.o. Female) Treating RN: Cornell Barman Primary Care Provider: Delight Stare Other Clinician: Referring Provider: Delight Stare Treating Provider/Extender: Frann Rider in Treatment: 31 History of Present Illness Location: right lateral ankle and right gluteal area Quality: Patient reports experiencing a sharp pain to affected area(s). Severity: Patient states wound are getting better slowly Duration: Patient has had the wound for > 4 months prior to seeking treatment at the wound center Timing: Pain in wound is constant (hurts all the time) Context: The wound occurred when the patient was in hospital with a necrotizing fasciitis of the right gluteal area and a ulceration on the right ankle Modifying Factors: Other treatment(s) tried include:admitted to the hospital for IV antibiotics and a full workup and has also had a recent angiogram Associated Signs and Symptoms: Patient reports having  increase discharge. HPI Description: 51 year old patient was sent to [place] from [facility] where she was seen by Dr. Sherril Cong for a left ankle ulceration and was recently hospitalized with hypotension and sepsis. She was treated with IV antibiotics and has been scheduled to see Dr. Sharol Given. She was seen by vascular surgery who recommended a femoral bypass but surgery has been delayed until her sacral wound from last year's necrotizing fasciitis has healed. Was seeing the wound care team at [facility] but wanted to change over. She is a smoker and smokes a pack of cigarettes a day The patient was recently admitted in [place] between February 2 and February 14. She had a follow- up to see vascular surgery, orthopedic surgery and infectious disease. 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 Review of the electronic medical records reveals that Dr. 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 An MRI showed edema in the bone which was consistent with osteomyelitis and the chronic ulcer and may need surgical intervention. She also had a sacral decubitus ulcer which was treated with the wound VAC. On 01/07/2017 she was taken up by the vascular surgeon Dr. Trula Slade for an abdominal aortogram and bilateral lower extremity runoff, for a history of having bilateral femoropopliteal bypass graft as well as external iliac stenting on the right and stenting of her bypass graft. She had developed a nonhealing wound on her right ankle and there was a possibility of a femoral  occlusion. the findings were noted and the impression was a surgical revascularization with a aorto  bifemoral bypass graft. Both the femoropopliteal bypass grafts were patent. 01/31/2017 -- x-ray of the right hip and pelvis -- IMPRESSION:No radiographic evidence of acute osteomyelitis. Normal-appearing right hip joint space for age. No acute bony abnormality of the hip. 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 Since seeing her last week she has not had an appointment with infectious disease or the orthopedic specialist yet. [name], [name] ([id number]) 02/06/2017 -- the patient missed a couple of appointments yesterday due to the weather but other than that has apparently given up smoking for the last 5 days. 02/13/2017 -- she has rescheduled her infectious disease appointment and also the appointment with the orthopedic surgeon Dr. Sharol Given 03/06/2017 -- she was seen by Dr. Lucianne Lei dam of infectious disease on 03/05/2017 and he recommendedIV antibiotics but the patient did not want to have that and he has given her Augmentin and doxycycline and will reevaluate her in 2 months time. 03/13/2017 -- he saw Dr. Trula Slade regarding her vascular issues and he would like to wait to the sacral wound is completely closed before considering aortobifemoral bypass graft. He will see her back in 2 months time. She was also seen by Dr. Sharol Given of orthopedics who recommended continue wound care dressings and follow in the office in about 2 months time. he also recommended 3 view radiographs of the right ankle at follow-up. 03/28/2017 -- she did have a PICC line placed and Dr. Lucianne Lei dam has begun IV vancomycin and ceftriaxone. she is going to continue this until she sees him in approximately a month's time 04/18/2017 -- we had applied for a skin substitute for the patient's care but her copayment is going to be about $300 a piece and the patient will not be able  to afford this. 05/08/17 on evaluation today patient appears to potentially have a fungal rash in the periwound region of the sacral area. This also seems to extend into the inguinal creases and factional region as well. She is tender to palpation with light rubbing over the region of the periwound and the skin in this region does have a beefy red appearance. Everything seems to point to a fungal infection at this time. She has been tolerating the dressing changes up to this point. There is no evidence of infection otherwise. 05/16/2017 -- was seen by Dr. 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 He did not think that she is making progress and was not able to examine her sacral decubitus ulcer as the patient would not allow it. She was switched to Bactrim DS 2 tablets twice a day since finishing her antibiotics. Her ESR was 76 and a CRP was 2.8. 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 05/23/17 on evaluation today patient's wound appears to be doing about the same in regard to the ankle as well as the gluteal area. She apparently has issues with the one back staying in place and she states the sill seems to break up the inferior aspect. She does have home help coming out to apply the wound VAC. She has been tolerating the dressing changes without any problem although she has had some increased pain in the gluteal region due to a fall she sustained and she feels like she brews that area  and she had pain actually radiating down her leg which is slowly beginning to improve. There's no evidence of infection. 05/29/17 Unfortunately on evaluation today patient's wounds both in regard to the ankle as well as in regard to her gluteal region on the right appear to be measuring the same as last week's evaluation. She  also tells me that she is having soreness today in regard to the right gluteal area because she had a visitor who came to see her a couple days ago and stayed for six hours and she had to set up for that entire length of time visiting. This has called some increased discomfort. Normally she does not set up for those long periods of time. Fortunately there's no evidence of infection and no worsening of the wounds. 06/13/2017 -- the patient has had a lot of health issues including perfuse diarrhea and generalized weakness and I have instructed her to see her PCP as soon as possible and if things get worse to go to the ER. She has not brought her wound VAC today. She continues to be on oral antibiotics 06/30/17- she is here in follow up evaluation for right ischial and right lateral malleolus ulcer. She continues with negative pressure wound therapy to the ischial wound. She is currently on a diabetic therapy per LYNISE, PORR. ([id number]) infectious disease, although she does state that she inconsistently takes it. She was advised to take her antibiotic as prescribed 07/11/2017 -- she has been unable to use a wound VAC regularly because she loses this evening and it has not been possible for her to use it. 07/18/2017 -- she is much more comfortable and looks better without the wound VAC and at this stage I believe it will be better for her to return the machine and continue with local care. 08/07/2017 -- she is back up to 3 weeks due to a vacation and during this time her wound VAC was returned. She has been doing local dressings 08/15/17 on evaluation today patient appears to be doing about the same in regard to her ankle as well as right gluteal wound. She has been tolerating the dressing changes and tells me that it's mainly her gluteal wound that hurts the ankle is nontender to palpation. She rates this pain to be a one out of 10 at rest and with cleansing of the wound five out of 10 at  least. No fevers, chills, nausea, or vomiting noted at this time. She does note that home health has told her to inquire about possibly using silver nitrate on the wound edges due to the road nature of the edges. 08/29/2017 -- -- I got a note which Dr. Lucianne Lei dam sent to Dr. 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 He had recommended definitive surgery to fix this. He recommended to continue with Bactrim Right ankle x-ray -- IMPRESSION: Findings compatible with cellulitis of the lateral malleolar region. A small focus of bony destruction involving the tip of the lateral malleolus is worrisome for osteomyelitis Electronic Signature(s) Signed: 08/29/2017 2:32:32 PM By: Christin Fudge MD, FACS Previous Signature: 08/29/2017 1:48:10 PM Version By: Christin Fudge MD, FACS Previous Signature: 08/29/2017 1:45:54 PM Version By: Christin Fudge MD, FACS Entered By: Christin Fudge on 08/29/2017 14:32:32 Gagliano, [name] ([id number]) -------------------------------------------------------------------------------- Physical Exam Details Patient Name: [name], [name] Date of Service: 08/29/2017 1:45 PM Medical Record Number: [id number] Patient Account Number: [id number] Date of Birth/Sex: September 13, 1966 (51 y.o. Female)  Treating RN: Cornell Barman Primary Care Provider: Delight Stare Other Clinician: Referring Provider: Delight Stare Treating Provider/Extender: Frann Rider in Treatment: 31 Constitutional . Pulse regular. Respirations normal and unlabored. Afebrile. . Eyes Nonicteric. Reactive to light. Ears, Nose, Mouth, and Throat Lips, teeth, and gums WNL.Marland Kitchen Moist mucosa without lesions. Neck supple and nontender. No palpable supraclavicular or cervical adenopathy. Normal sized without goiter. Respiratory WNL. No retractions.. Cardiovascular Pedal Pulses WNL. No clubbing, cyanosis or edema. Lymphatic No adneopathy. No  adenopathy. No adenopathy. Musculoskeletal Adexa without tenderness or enlargement.. Digits and nails w/o clubbing, cyanosis, infection, petechiae, ischemia, or inflammatory conditions.. Integumentary (Hair, Skin) No suspicious lesions. No crepitus or fluctuance. No peri-wound warmth or erythema. No masses.Marland Kitchen Psychiatric Judgement and insight Intact.. No evidence of depression, anxiety, or agitation.. Notes the right ankle wound was washed off briskly with moist saline gauze and all the debris removed and the wound looks fairly clean. The right gluteal wound, has minimal maceration at the edges but overall is looking clean Electronic Signature(s) Signed: 08/29/2017 2:33:27 PM By: Christin Fudge MD, FACS Entered By: Christin Fudge on 08/29/2017 14:33:27 Farina, [name] ([id number]) -------------------------------------------------------------------------------- Physician Orders Details Patient Name: [name] Date of Service: 08/29/2017 1:45 PM Medical Record Number: [id number] Patient Account Number: [id number] Date of Birth/Sex: 1966-04-25 (51 y.o. Female) Treating RN: Cornell Barman Primary Care Provider: Delight Stare Other Clinician: Referring Provider: Delight Stare Treating Provider/Extender: Frann Rider in Treatment: 55 Verbal / Phone Orders: No Diagnosis Coding Wound Cleansing Wound #1 Right,Lateral Malleolus o Clean wound with Normal Saline. Wound #2 Right Gluteus o Clean wound with Normal Saline. Skin Barriers/Peri-Wound Care Wound #1 Right,Lateral Malleolus o Skin Prep Wound #2 Right Gluteus o Skin Prep Primary Wound Dressing Wound #1 Right,Lateral Malleolus o Aquacel Ag Wound #2 Right Gluteus o Aquacel Ag Secondary Dressing Wound #1 Right,Lateral Malleolus o Boardered Foam Dressing Wound #2 Right Gluteus o Boardered Foam Dressing Dressing Change Frequency Wound #1 Right,Lateral Malleolus o Change Dressing Monday, Wednesday,  Friday Wound #2 Right Gluteus o Change Dressing Monday, Wednesday, Friday Follow-up Appointments Celona, Joelee S. ([id number]) Wound #1 Right,Lateral Malleolus o Return Appointment in 1 week. Wound #2 Right Gluteus o Return Appointment in 1 week. Off-Loading Wound #1 Right,Lateral Malleolus o Turn and reposition every 2 hours Wound #2 Right Gluteus o Turn and reposition every 2 hours Home Health Wound #1 [place] Visits - Monday, Wednesday, Friday o Home Health Nurse may visit PRN to address patientos wound care needs. o FACE TO FACE ENCOUNTER: MEDICARE and MEDICAID PATIENTS: I certify that this patient is under my care and that I had a face-to-face encounter that meets the physician face-to-face encounter requirements with this patient on this date. The encounter with the patient was in whole or in part for the following MEDICAL CONDITION: (primary reason for [place]) MEDICAL NECESSITY: I certify, that based on my findings, NURSING services are a medically necessary home health service. HOME BOUND STATUS: I certify that my clinical findings support that this patient is homebound (i.e., Due to illness or injury, pt requires aid of supportive devices such as crutches, cane, wheelchairs, walkers, the use of special transportation or the assistance of another person to leave their place of residence. There is a normal inability to leave the home and doing so requires considerable and taxing effort. Other absences are for medical reasons / religious services and are infrequent or of short duration when for other reasons). o If current dressing causes regression  in wound condition, may D/C ordered dressing product/s and apply Normal Saline Moist Dressing daily until next [place] / Other MD appointment. [place] of regression in wound condition at [phone]. o Please direct any NON-WOUND  related issues/requests for orders to patient's Primary Care Physician Wound #2 Right [place] Visits - Monday, Wednesday, Friday o Home Health Nurse may visit PRN to address patientos wound care needs. o FACE TO FACE ENCOUNTER: MEDICARE and MEDICAID PATIENTS: I certify that this patient is under my care and that I had a face-to-face encounter that meets the physician face-to-face encounter requirements with this patient on this date. The encounter with the patient was in whole or in part for the following MEDICAL CONDITION: (primary reason for [place]) MEDICAL NECESSITY: I certify, that based on my findings, NURSING services are a medically necessary home health service. HOME BOUND STATUS: I certify that my clinical findings support that this patient is homebound (i.e., Due to illness or injury, pt requires aid of supportive devices such as crutches, cane, wheelchairs, walkers, the use of special transportation or the assistance of another person to leave their place of residence. There is a normal inability to leave the home and doing so requires considerable and taxing effort. Other Benassi, TASHYRA ADDUCI ([id number]) absences are for medical reasons / religious services and are infrequent or of short duration when for other reasons). o If current dressing causes regression in wound condition, may D/C ordered dressing product/s and apply Normal Saline Moist Dressing daily until next [place] / Other MD appointment. [place] of regression in wound condition at [phone]. o Please direct any NON-WOUND related issues/requests for orders to patient's Primary Care Physician Electronic Signature(s) Signed: 08/29/2017 4:18:43 PM By: Christin Fudge MD, FACS Signed: 08/29/2017 4:29:15 PM By: Gretta Cool, [name], RN, CWS, [name], [name] Entered By: Gretta Cool, [name], RN, CWS, Kim on 08/29/2017 14:30:23 Connelley, [name]  ([id number]) -------------------------------------------------------------------------------- Problem List Details Patient Name: [name], [name]. Date of Service: 08/29/2017 1:45 PM Medical Record Number: [id number] Patient Account Number: [id number] Date of Birth/Sex: November 10, 1966 (51 y.o. Female) Treating RN: Cornell Barman Primary Care Provider: Delight Stare Other Clinician: Referring Provider: Delight Stare Treating Provider/Extender: Frann Rider in Treatment: 31 Active Problems ICD-10 Encounter Code Description Active Date Diagnosis E11.621 Type 2 diabetes mellitus with foot ulcer 01/23/2017 Yes L89.314 Pressure ulcer of right buttock, stage 4 01/23/2017 Yes L97.312 Non-pressure chronic ulcer of right ankle with fat layer 01/23/2017 Yes exposed F17.218 Nicotine dependence, cigarettes, with other nicotine- 01/23/2017 Yes induced disorders I70.233 Atherosclerosis of native arteries of right leg with 01/23/2017 Yes ulceration of ankle M86.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, right ankle and foot 01/23/2017 Yes Inactive Problems Resolved Problems Electronic Signature(s) Signed: 08/29/2017 2:32:16 PM By: Christin Fudge MD, FACS Entered By: Christin Fudge on 08/29/2017 14:32:16 Thorns, [name] ([id number]) -------------------------------------------------------------------------------- Progress Note Details Patient Name: [name] Date of Service: 08/29/2017 1:45 PM Medical Record Number: [id number] Patient Account Number: [id number] Date of Birth/Sex: [birth year] (51 y.o. Female) Treating RN: Cornell Barman Primary Care Provider: Delight Stare Other Clinician: Referring Provider: Delight Stare Treating Provider/Extender: Frann Rider in Treatment: 31 Subjective Chief Complaint Information obtained from Patient She is here in follow up evaluation for right ischial and right lateral malleolus History of Present Illness (HPI) The following HPI elements were documented for the  patient's wound: Location: right lateral ankle and right gluteal area Quality: Patient reports experiencing a sharp pain to affected  area(s). Severity: Patient states wound are getting better slowly Duration: Patient has had the wound for > 4 months prior to seeking treatment at the wound center Timing: Pain in wound is constant (hurts all the time) Context: The wound occurred when the patient was in hospital with a necrotizing fasciitis of the right gluteal area and a ulceration on the right ankle Modifying Factors: Other treatment(s) tried include:admitted to the hospital for IV antibiotics and a full workup and has also had a recent angiogram Associated Signs and Symptoms: Patient reports having increase discharge. 51 year old patient was sent to [place] from [facility] where she was seen by Dr. Sherril Cong for a left ankle ulceration and was recently hospitalized with hypotension and sepsis. She was treated with IV antibiotics and has been scheduled to see Dr. Sharol Given. She was seen by vascular surgery who recommended a femoral bypass but surgery has been delayed until her sacral wound from last year's necrotizing fasciitis has healed. Was seeing the wound care team at [facility] but wanted to change over. She is a smoker and smokes a pack of cigarettes a day The patient was recently admitted in [place] between February 2 and February 14. She had a follow- up to see vascular surgery, orthopedic surgery and infectious disease. 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 Review of the electronic medical records reveals that Dr. 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 An MRI showed edema in the bone which was consistent with osteomyelitis and the chronic ulcer and may need surgical intervention. She also had a sacral decubitus ulcer which was treated with the wound VAC. On 01/07/2017 she was taken up by the vascular surgeon Dr. Trula Slade for an abdominal aortogram and bilateral lower extremity runoff, for a history of having bilateral femoropopliteal bypass graft as well as external iliac stenting on the right and stenting of her bypass graft. She had developed a nonhealing wound on her right ankle and there was a possibility of a femoral occlusion. the findings were noted and the impression was a surgical revascularization with a aorto bifemoral bypass graft. Both the femoropopliteal [place], [place]. ([id number]) bypass grafts were patent. 01/31/2017 -- x-ray of the right hip and pelvis -- IMPRESSION:No radiographic evidence of acute osteomyelitis. Normal-appearing right hip joint space for age. No acute bony abnormality of the hip. 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 Since seeing her last week she has not had an appointment with infectious disease or the orthopedic specialist yet. 02/06/2017 -- the patient missed a couple of appointments yesterday due to the weather but other than that has apparently given up smoking for the last 5 days. 02/13/2017 -- she has rescheduled her infectious disease appointment and also the appointment with the orthopedic surgeon Dr. Sharol Given 03/06/2017 -- she was seen by Dr. Lucianne Lei dam of infectious disease on 03/05/2017 and he recommendedIV antibiotics but the patient did not want to have that and he has given her Augmentin and doxycycline and will reevaluate her in 2 months time. 03/13/2017 -- he saw Dr. Trula Slade regarding  her vascular issues and he would  like to wait to the sacral wound is completely closed before considering aortobifemoral bypass graft. He will see her back in 2 months time. She was also seen by Dr. Sharol Given of orthopedics who recommended continue wound care dressings and follow in the office in about 2 months time. he also recommended 3 view radiographs of the right ankle at follow-up. 03/28/2017 -- she did have a PICC line placed and Dr. Lucianne Lei dam has begun IV vancomycin and ceftriaxone. she is going to continue this until she sees him in approximately a month's time 04/18/2017 -- we had applied for a skin substitute for the patient's care but her copayment is going to be about $300 a piece and the patient will not be able to afford this. 05/08/17 on evaluation today patient appears to potentially have a fungal rash in the periwound region of the sacral area. This also seems to extend into the inguinal creases and factional region as well. She is tender to palpation with light rubbing over the region of the periwound and the skin in this region does have a beefy red appearance. Everything seems to point to a fungal infection at this time. She has been tolerating the dressing changes up to this point. There is no evidence of infection otherwise. 05/16/2017 -- was seen by Dr. 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 He did not think that she is making progress and was not able to examine her sacral decubitus ulcer as the patient would not allow it. She was switched to Bactrim DS 2 tablets twice a day since finishing her antibiotics. Her ESR was 76 and a CRP was 2.8. 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 05/23/17 on evaluation today patient's wound appears to be doing about the same in regard to the ankle as well as the gluteal area. She  apparently has issues with the one back staying in place and she states the sill seems to break up the inferior aspect. She does have home help coming out to apply the wound VAC. She has been tolerating the dressing changes without any problem although she has had some increased pain in the gluteal region due to a fall she sustained and she feels like she brews that area and she had pain actually radiating down her leg which is slowly beginning to improve. There's no evidence of infection. 05/29/17 Unfortunately on evaluation today patient's wounds both in regard to the ankle as well as in regard to her gluteal region on the right appear to be measuring the same as last week's evaluation. She also tells me that she is having soreness today in regard to the right gluteal area because she had a visitor who came to see her a couple days ago and stayed for six hours and she had to set up for that entire length of time visiting. This has called some increased discomfort. Normally she does not set up for those long periods of [place], [place]. ([id number]) time. Fortunately there's no evidence of infection and no worsening of the wounds. 06/13/2017 -- the patient has had a lot of health issues including perfuse diarrhea and generalized weakness and I have instructed her to see her PCP as soon as possible and if things get worse to go to the ER.  She has not brought her wound VAC today. She continues to be on oral antibiotics 06/30/17- she is here in follow up evaluation for right ischial and right lateral malleolus ulcer. She continues with negative pressure wound therapy to the ischial wound. She is currently on a diabetic therapy per infectious disease, although she does state that she inconsistently takes it. She was advised to take her antibiotic as prescribed 07/11/2017 -- she has been unable to use a wound VAC regularly because she loses this evening and it has not been possible for her to use  it. 07/18/2017 -- she is much more comfortable and looks better without the wound VAC and at this stage I believe it will be better for her to return the machine and continue with local care. 08/07/2017 -- she is back up to 3 weeks due to a vacation and during this time her wound VAC was returned. She has been doing local dressings 08/15/17 on evaluation today patient appears to be doing about the same in regard to her ankle as well as right gluteal wound. She has been tolerating the dressing changes and tells me that it's mainly her gluteal wound that hurts the ankle is nontender to palpation. She rates this pain to be a one out of 10 at rest and with cleansing of the wound five out of 10 at least. No fevers, chills, nausea, or vomiting noted at this time. She does note that home health has told her to inquire about possibly using silver nitrate on the wound edges due to the road nature of the edges. 08/29/2017 -- -- I got a note which Dr. Lucianne Lei dam sent to Dr. 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 He had recommended definitive surgery to fix this. He recommended to continue with Bactrim Right ankle x-ray -- IMPRESSION: Findings compatible with cellulitis of the lateral malleolar region. A small focus of bony destruction involving the tip of the lateral malleolus is worrisome for osteomyelitis Objective Constitutional Pulse regular. Respirations normal and unlabored. Afebrile. Vitals Time Taken: 2:00 PM, Height: 67 in, Weight: 137 lbs, BMI: 21.5, Temperature: 98.2 F, Pulse: 79 bpm, Respiratory Rate: 16 breaths/min, Blood Pressure: 104/57 mmHg. DELORIES, MAURI. ([id number]) Eyes Nonicteric. Reactive to light. Ears, Nose, Mouth, and Throat Lips, teeth, and gums WNL.Marland Kitchen Moist mucosa without lesions. Neck supple and nontender. No palpable supraclavicular or cervical adenopathy. Normal sized without  goiter. Respiratory WNL. No retractions.. Cardiovascular Pedal Pulses WNL. No clubbing, cyanosis or edema. Lymphatic No adneopathy. No adenopathy. No adenopathy. Musculoskeletal Adexa without tenderness or enlargement.. Digits and nails w/o clubbing, cyanosis, infection, petechiae, ischemia, or inflammatory conditions.Marland Kitchen Psychiatric Judgement and insight Intact.. No evidence of depression, anxiety, or agitation.. General Notes: the right ankle wound was washed off briskly with moist saline gauze and all the debris removed and the wound looks fairly clean. The right gluteal wound, has minimal maceration at the edges but overall is looking clean Integumentary (Hair, Skin) No suspicious lesions. No crepitus or fluctuance. No peri-wound warmth or erythema. No masses.. Wound #1 status is Open. Original cause of wound was Gradually Appeared. The wound is located on the Right,Lateral Malleolus. The wound measures 1.5cm length x 1.8cm width x 0.4cm depth; 2.121cm^2 area and 0.848cm^3 volume. There is Fat Layer (Subcutaneous Tissue) Exposed exposed. There is no tunneling noted, however, there is undermining starting at 12:00 and ending at 12:00 with a maximum distance of 0.4cm. There is a large amount of  serous drainage noted. The wound margin is distinct with the outline attached to the wound base. There is large (67-100%) pink granulation within the wound bed. There is a small (1-33%) amount of necrotic tissue within the wound bed including Adherent Slough. The periwound skin appearance exhibited: Induration, Maceration, Erythema. The periwound skin appearance did not exhibit: Callus, Crepitus, Excoriation, Rash, Scarring, Dry/Scaly, Atrophie Blanche, Cyanosis, Ecchymosis, Hemosiderin Staining, Mottled, Pallor, Rubor. The surrounding wound skin color is noted with erythema which is circumferential. Periwound temperature was noted as No Abnormality. The periwound has tenderness on  palpation. Wound #2 status is Open. Original cause of wound was Gradually Appeared. The wound is located on the Right Gluteus. The wound measures 2cm length x 1cm width x 1.2cm depth; 1.571cm^2 area and 1.885cm^3 volume. There is Fat Layer (Subcutaneous Tissue) Exposed exposed. There is no tunneling or undermining noted. There is a large amount of serous drainage noted. Foul odor after cleansing was noted. The wound margin is distinct with the outline attached to the wound base. There is small (1-33%) red, pink [place], Saba S. ([id number]) granulation within the wound bed. There is a large (67-100%) amount of necrotic tissue within the wound bed including Adherent Slough. The periwound skin appearance exhibited: Maceration, Ecchymosis. Periwound temperature was noted as No Abnormality. The periwound has tenderness on palpation. 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 Wound #2 Right Gluteus: Clean wound with Normal Saline. Skin Barriers/Peri-Wound Care: Wound #1 Right,Lateral Malleolus: Skin Prep Wound #2 Right Gluteus: Skin Prep Primary Wound Dressing: Wound #1 Right,Lateral Malleolus: Aquacel Ag Wound #2 Right Gluteus: Aquacel Ag Secondary Dressing: Wound #1 Right,Lateral Malleolus: Boardered Foam Dressing Wound #2 Right Gluteus: Boardered Foam Dressing Dressing Change Frequency: Wound #1 Right,Lateral Malleolus: Change Dressing Monday, Wednesday, Friday Wound #2 Right Gluteus: DANIEL, JOHNDROW ([id number]) Change Dressing Monday, Wednesday, Friday Follow-up Appointments: Wound #1 Right,Lateral Malleolus: Return Appointment in 1 week. Wound #2 Right Gluteus: Return Appointment in 1 week. Off-Loading: Wound #1 Right,Lateral Malleolus: Turn and reposition every 2 hours Wound #2 Right Gluteus: Turn and reposition every 2 hours Home Health: Wound #1 Right,Lateral Malleolus: [place] Visits - Monday, Wednesday, Friday [place] Nurse may visit PRN to address patient s wound care needs. FACE TO FACE ENCOUNTER: MEDICARE and MEDICAID PATIENTS: I certify that this patient is under my care and that I had a face-to-face encounter that meets the physician face-to-face encounter requirements with this patient on this date. The encounter with the patient was in whole or in part for the following MEDICAL CONDITION: (primary reason for [place]) MEDICAL NECESSITY: I certify, that based on my findings, NURSING services are a medically necessary home health service. HOME BOUND STATUS: I certify that my clinical findings support that this patient is homebound (i.e., Due to illness or injury, pt requires aid of supportive devices such as crutches, cane, wheelchairs, walkers, the use of special transportation or the assistance of another person to leave their place of residence. There is a normal inability to leave the home and doing so requires considerable and taxing effort. Other absences are  for medical reasons / religious services and are infrequent or of short duration when for other reasons). If current dressing causes regression in wound condition, may D/C ordered dressing product/s and apply Normal Saline Moist Dressing daily until next [place] / Other MD appointment. [place] of regression in wound condition at [phone]. Please direct any NON-WOUND related issues/requests for orders to patient's Primary Care Physician Wound #2  Right Gluteus: [place] Visits - Monday, Wednesday, Friday Home Health Nurse may visit PRN to address patient s wound care needs. FACE TO FACE ENCOUNTER: MEDICARE and MEDICAID PATIENTS: I certify that this patient is under my care and that I had a face-to-face encounter that meets the physician face-to-face encounter requirements with this patient on this date. The encounter with the patient was in whole or in part for the following MEDICAL CONDITION: (primary reason for [place]) MEDICAL NECESSITY: I certify, that based on my findings, NURSING services are a medically necessary home health service. HOME BOUND STATUS: I certify that my clinical findings support that this patient is homebound (i.e., Due to illness or injury, pt requires aid of supportive devices such as crutches, cane, wheelchairs, walkers, the use of special transportation or the assistance of another person to leave their place of residence. There is a normal inability to leave the home and doing so requires considerable and taxing effort. Other absences are for medical reasons / religious services and are infrequent or of short duration when for other reasons). If current dressing causes regression in wound condition, may D/C ordered dressing product/s and apply Normal Saline Moist Dressing daily until next [place] / Other MD appointment. [place] of regression in wound condition at [phone]. Please direct any NON-WOUND related issues/requests for orders to patient's Primary Care Physician KAELEIGH, [place] ([phone]) After review today I have recommended: 1. Silver alginate to be applied to the right ankle, changed every other day 2. Silver alginate with a gauze bolster to the right gluteal region to be changed 3 times a week. 3. adequate protein, vitamin A, vitamin C and zinc 4. I have urged her to keep her appointments both with Dr. Lucianne Lei dam and with Dr. Sharol Given 5. return  to see [place] next week. Electronic Signature(s) Signed: 08/29/2017 2:34:19 PM By: Christin Fudge MD, FACS Entered By: Christin Fudge on 08/29/2017 14:34:19 Rando, [name] ([id number]) -------------------------------------------------------------------------------- SuperBill Details Patient Name: [name], [name]. Date of Service: 08/29/2017 Medical Record Number: [id number] Patient Account Number: [id number] Date of Birth/Sex: 1966/04/04 (51 y.o. 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 M86.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, right ankle and foot Facility Procedures CPT4 Code: [id number] Description: 99213 - WOUND CARE VISIT-LEV 3 EST PT Modifier: Quantity: 1 Physician Procedures CPT4 Code Description: [id number] 99213 - WC PHYS LEVEL 3 - EST PT ICD-10 Description Diagnosis E11.621 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

## 2017-09-04 ENCOUNTER — Encounter (INDEPENDENT_AMBULATORY_CARE_PROVIDER_SITE_OTHER): Payer: Self-pay | Admitting: Orthopedic Surgery

## 2017-09-04 ENCOUNTER — Ambulatory Visit (INDEPENDENT_AMBULATORY_CARE_PROVIDER_SITE_OTHER): Payer: Medicare Other | Admitting: Infectious Disease

## 2017-09-04 ENCOUNTER — Encounter: Payer: Self-pay | Admitting: Infectious Disease

## 2017-09-04 ENCOUNTER — Ambulatory Visit (INDEPENDENT_AMBULATORY_CARE_PROVIDER_SITE_OTHER): Payer: Medicare Other | Admitting: Orthopedic Surgery

## 2017-09-04 VITALS — BP 105/67 | HR 80 | Temp 98.2°F | Ht 67.0 in | Wt 168.0 lb

## 2017-09-04 DIAGNOSIS — E875 Hyperkalemia: Secondary | ICD-10-CM | POA: Diagnosis not present

## 2017-09-04 DIAGNOSIS — Z794 Long term (current) use of insulin: Secondary | ICD-10-CM | POA: Diagnosis not present

## 2017-09-04 DIAGNOSIS — M86471 Chronic osteomyelitis with draining sinus, right ankle and foot: Secondary | ICD-10-CM

## 2017-09-04 DIAGNOSIS — I70233 Atherosclerosis of native arteries of right leg with ulceration of ankle: Secondary | ICD-10-CM

## 2017-09-04 DIAGNOSIS — M726 Necrotizing fasciitis: Secondary | ICD-10-CM | POA: Diagnosis not present

## 2017-09-04 DIAGNOSIS — M86661 Other chronic osteomyelitis, right tibia and fibula: Secondary | ICD-10-CM | POA: Diagnosis not present

## 2017-09-04 DIAGNOSIS — E119 Type 2 diabetes mellitus without complications: Secondary | ICD-10-CM

## 2017-09-04 DIAGNOSIS — IMO0001 Reserved for inherently not codable concepts without codable children: Secondary | ICD-10-CM

## 2017-09-04 DIAGNOSIS — I739 Peripheral vascular disease, unspecified: Secondary | ICD-10-CM | POA: Diagnosis not present

## 2017-09-04 MED ORDER — DOXYCYCLINE HYCLATE 100 MG PO TABS: 100.0000 mg | ORAL_TABLET | Freq: Two times a day (BID) | ORAL | 8 refills | Status: DC

## 2017-09-04 NOTE — Progress Notes (Signed)
Office Visit Note   Patient: [name]           Date of Birth: 21-Jul-1966           MRN: [id number] Visit Date: 09/04/2017              Requested by: [name], [address], [place] [address] PCP: [name], MD  Chief Complaint  Patient presents with  . Right Ankle - Open Wound      HPI: Patient is a51 year old woman with diabetes peripheral vascular disease she is status post stents to the right lower extremity she has been undergoing excellent wound care at the wound center she states she does have a follow up with Dr Trula Slade and she states that there have been discussions for abdominal aortic surgery. She states is also been discussions for additional stenting.  Assessment & Plan: Visit Diagnoses:  1. Atherosclerosis of native artery of right lower extremity with ulceration of ankle (HCC)     Plan: she will continue with her silver alginate dressings. There is no cellulitis no signs of infection. Will continued wound care I will follow-up in the office in 4 weeks. She will follow up sooner if there is any changes. Discussed that I would recommend against surgical intervention for the ankle until we have establish better arterial inflow.  Follow-Up Instructions: Return in about 4 weeks (around 10/02/2017).   Ortho Exam  Patient is alert, oriented, no adenopathy, well-dressed, normal affect, normal respiratory effort. Examination patient's foot is warm she does have swelling there is no cellulitis no odor there is clear drainage from the wound. The wound measures 13 x 16 mm and is 2 mm deep there is healthy tissue at the base of the wound with no exposed bone or tendon.  Imaging: No results found. No images are attached to the encounter.  Labs: Lab Results  Component Value Date   HGBA1C 10.8 (H) 12/31/2016   HGBA1C 11.2 (H) 12/24/2016   HGBA1C 13.1 (H) 06/27/2011   ESRSEDRATE 46 (H) 08/12/2017   ESRSEDRATE 13 05/12/2017   ESRSEDRATE 76  (H) 04/30/2017   CRP 11.5 (H) 08/12/2017   CRP 2.1 05/12/2017   CRP 2.8 (H) 04/30/2017   REPTSTATUS 12/25/2016 FINAL 12/23/2016   GRAMSTAIN  09/30/2016    ABUNDANT WBC PRESENT,BOTH PMN AND MONONUCLEAR ABUNDANT GRAM NEGATIVE RODS ABUNDANT GRAM POSITIVE COCCI IN PAIRS IN CLUSTERS FEW GRAM POSITIVE RODS    CULT MULTIPLE SPECIES PRESENT, SUGGEST RECOLLECTION (A) 12/23/2016    Orders:  No orders of the defined types were placed in this encounter.  No orders of the defined types were placed in this encounter.    Procedures: No procedures performed  Clinical Data: No additional findings.  ROS:  All other systems negative, except as noted in the HPI. Review of Systems  Objective: Vital Signs: There were no vitals taken for this visit.  Specialty Comments:  No specialty comments available.  PMFS History: Patient Active Problem List   Diagnosis Date Noted  . Hyperkalemia 06/26/2017  . Atherosclerosis of native artery of right lower extremity with ulceration of ankle ([place]) 05/13/2017  . Osteomyelitis of right fibula ([place]) 03/05/2017  . Non-pressure chronic ulcer of right ankle limited to breakdown of skin ([place]) 01/17/2017  . Elevated lactic acid level   . Osteomyelitis of ankle ([place]) 12/29/2016  . Chronic pain 12/24/2016  . Buttock wound, left, subsequent encounter   . Hypotension 12/23/2016  . IDDM (insulin dependent  diabetes mellitus) ([place]) 12/23/2016  . AKI (acute kidney injury) ([place]) 12/23/2016  . Malnutrition ([place]) 12/23/2016  . Thrush 12/23/2016  . Sepsis ([place]) 11/27/2016  . DM2 (diabetes mellitus, type 2) ([place]) 11/27/2016  . Chronic back pain 11/27/2016  . Orthostatic hypotension 11/27/2016  . Demand ischemia ([place])   . Other hyperlipidemia   . Right sided weakness   . Retinal tumor of right eye   . Pressure injury of skin 10/01/2016  . Necrotizing fasciitis ([place]) 09/30/2016  . 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 Paresthesia of both hands  03/05/2016  . DDD (degenerative disc disease), lumbosacral 03/26/2015  . DDD (degenerative disc disease), cervical 03/26/2015  . Sacroiliac joint disease 03/26/2015  . Occipital neuralgia 03/26/2015  . Pain in limb-Bilateral Leg 04/25/2014  . PAD (peripheral artery disease) ([place]) 04/05/2013  . Chronic total occlusion of artery of the extremities ([place]) 02/10/2012   Past Medical History:  Diagnosis Date  . Arthritis   . Asthma   . Chronic back pain   . Depression   . Diabetes mellitus   . Diabetic neuropathy ([place])   . Family history of adverse reaction to anesthesia    mother had difficlty waking   . GERD (gastroesophageal reflux disease)   . Hyperlipidemia   . Joint pain   . Leg pain    With Walking  . Osteomyelitis of right fibula ([place]) 03/05/2017  . PAD (peripheral artery disease) ([place])   . Reflux   . Ulcer    Foot    Family History  Problem Relation Age of Onset  . Coronary artery disease Mother   . Peripheral vascular disease Mother   . Heart disease Mother        Before age 41  . Other Mother        Venous insuffiency  . Diabetes Mother   . Hyperlipidemia Mother   . Hypertension Mother   . Varicose Veins Mother   . Heart attack Mother        before age 53  . Heart disease Father   . Diabetes Father   . Diabetes Maternal Grandmother   . Diabetes Paternal Grandmother   . Diabetes Paternal Grandfather   . Diabetes Sister   . Hypertension Sister   . Diabetes Brother   . Hypertension Brother     Past Surgical History:  Procedure Laterality Date  . ABDOMINAL AORTAGRAM  June 15, 2014  . ABDOMINAL AORTAGRAM N/A 06/15/2014   Procedure: ABDOMINAL Maxcine Ham;  Surgeon: Serafina Mitchell, MD;  Location[facility] CATH LAB;  Service: Cardiovascular;  Laterality: N/A;  . ABDOMINAL AORTAGRAM N/A 11/22/2014   Procedure: ABDOMINAL AORTAGRAM;  Surgeon: Serafina Mitchell, MD;  Location: [facility] CATH LAB;  Service: Cardiovascular;  Laterality: N/A;  . ABDOMINAL AORTOGRAM W/LOWER EXTREMITY N/A  01/07/2017   Procedure: Abdominal Aortogram w/Lower Extremity;  Surgeon: Serafina Mitchell, MD;  Location: [place] CV LAB;  Service: Cardiovascular;  Laterality: N/A;  . ARTERIAL BYPASS SURGRY  07/05/2010   Right Common Femoral to below knee popliteal BPG  . BACK SURGERY     X's  2  . CARDIAC CATHETERIZATION    . CHOLECYSTECTOMY     Gall Bladder  . CYSTECTOMY Right    foot  . CYSTECTOMY Left    wrist  . INTERCOSTAL NERVE BLOCK  November 2015  . IR FLUORO GUIDE CV LINE RIGHT  03/20/2017  . IR US GUIDE VASC ACCESS RIGHT  03/20/2017  . IRRIGATION AND DEBRIDEMENT BUTTOCKS Right 09/30/2016  Procedure: DEBRIDEMENT RIGHT  BUTTOCK WOUND;  Surgeon: Georganna Skeans, MD;  Location: [place];  Service: General;  Laterality: Right;  . left foot surgery    . left wrist cyst removal Left   . PERIPHERAL VASCULAR CATHETERIZATION N/A 05/07/2016   Procedure: Abdominal Aortogram;  Surgeon: Serafina Mitchell, MD;  Location: [place] CV LAB;  Service: Cardiovascular;  Laterality: N/A;  . PERIPHERAL VASCULAR CATHETERIZATION N/A 05/07/2016   Procedure: Lower Extremity Angiography;  Surgeon: Serafina Mitchell, MD;  Location: [place] CV LAB;  Service: Cardiovascular;  Laterality: N/A;  . PERIPHERAL VASCULAR CATHETERIZATION N/A 05/07/2016   Procedure: Aortic Arch Angiography;  Surgeon: Serafina Mitchell, MD;  Location: [place] CV LAB;  Service: Cardiovascular;  Laterality: N/A;  . PERIPHERAL VASCULAR CATHETERIZATION N/A 05/07/2016   Procedure: Upper Extremity Angiography;  Surgeon: Serafina Mitchell, MD;  Location: [facility] CV LAB;  Service: Cardiovascular;  Laterality: N/A;  . PERIPHERAL VASCULAR CATHETERIZATION Right 05/07/2016   Procedure: Peripheral Vascular Balloon Angioplasty;  Surgeon: Serafina Mitchell, MD;  Location: [place] CV LAB;  Service: Cardiovascular;  Laterality: Right;  subclavian  . PERIPHERAL VASCULAR CATHETERIZATION Right 05/07/2016   Procedure: Peripheral Vascular Intervention;  Surgeon: Serafina Mitchell, MD;  Location: [place] CV LAB;  Service: Cardiovascular;  Laterality: Right;  External  Iliac  . SKIN GRAFT Right 2012   [name] by Dr. Nils Pyle- Right and Left Ankle  . SPINE SURGERY    . TONSILLECTOMY     Social History   Occupational History  . disabled    Social History Main Topics  . Smoking status: Former Smoker    Years: 30.00    Types: Cigarettes    Quit date: 02/07/2017  . Smokeless tobacco: Never Used  . Alcohol use No  . Drug use: No  . Sexual activity: Yes    Birth control/ protection: Post-menopausal

## 2017-09-04 NOTE — Progress Notes (Signed)
 Subjective:    Cc: followup for chronic wounds and osteo in ankle   Patient ID: [name], female    DOB: 10/14/1966, 51 y.o.   MRN: [id number]  HPI  51 y.o. female with  diabetic neuropathy diabetic foot ulcer peripheral vascular disease status post bypass surgery with evidence of osteomyelitis on MRI in the [facility] in February of 2018. She also has a sacral wound that is not healed up completely where she had prior necrotizing fasciitis.I saw her in the hospital and she did not want to undergo surgical intervention. She tells me that Dr Duda is concerned that if he performs at biopsy or debridement on this call it will lead to her losing her leg. I have indeed concerned that she mailed to my need a below the knee or above the knee and dictation to cure osteomyelitis at this site. I proposed the idea of her going on empiric IV antibiotics but she was not very eager to do that initially . Therefore I initially placed her  doxycycline twice daily which she has come off of now and also Augmentin. She had trouble tolerating this regimen with nausea and diarrhea. She ultimately decided to try IV abbx  We then placed her on IV vancomycin and ceftriaxone x 6 weeks. NOTE her vancomycin levels were very haphazard suggesting that she was not optimally or consistently compliant with her antibiotics.  She claimed that her wound on her foot was healing up somewhat. She ONCE AGAIN at that visit when I last saw her did  NOT want me to examine her sacral wound. This previously was where she had Nec fascitis but was not to bone--though wound care classifies as STAGE IV.  We  switched her to DS Bactrim 2 BID since finishing her abx.  Her ESR and CRP remain elevated at 76 and 2.8.  I again discussed that she would need amputation to cure her osteo in her fibula but that we could try to continue suppressive antibiotics.    Since I last saw her she has continued to follow with Dr. Britto with wound care    She is being evaluated by Dr. Brabham for vascular surgery. He would be understandably concerned re risk of her ankle infection spreading to blood stream and to endovascular space as well as the challenge posed by location of her thigh wound.  When we checked plain films now are also showing osteo in the ankle.  She continues to be followed VERY closely by Dr. Britto and Dr. Duda. They do not see need for urgent surgery at this point. I myself would prefer her to stay on antibiotics. I would note that she had elevated K on her BMP on bactrim and this was never followed up to determine if this was real  Or pseudohyperkalemia. When I asked to repeat labs today she burst into tears. I told her I would take her off bactrim in that case and she could have repeat K with PCP.     Past Medical History:  Diagnosis Date  . Arthritis   . Asthma   . Chronic back pain   . Depression   . Diabetes mellitus   . Diabetic neuropathy (HCC)   . Family history of adverse reaction to anesthesia    mother had difficlty waking   . GERD (gastroesophageal reflux disease)   . Hyperlipidemia   . Joint pain   . Leg pain    With Walking  . Osteomyelitis of right   fibula ([place]) 03/05/2017  . PAD (peripheral artery disease) ([place])   . Reflux   . Ulcer    Foot    Past Surgical History:  Procedure Laterality Date  . ABDOMINAL AORTAGRAM  June 15, 2014  . ABDOMINAL AORTAGRAM N/A 06/15/2014   Procedure: ABDOMINAL Maxcine Ham;  Surgeon: Serafina Mitchell, MD;  Location: [facility] CATH LAB;  Service: Cardiovascular;  Laterality: N/A;  . ABDOMINAL AORTAGRAM N/A 11/22/2014   Procedure: ABDOMINAL AORTAGRAM;  Surgeon: Serafina Mitchell, MD;  Location: [facility] CATH LAB;  Service: Cardiovascular;  Laterality: N/A;  . ABDOMINAL AORTOGRAM W/LOWER EXTREMITY N/A 01/07/2017   Procedure: Abdominal Aortogram w/Lower Extremity;  Surgeon: Serafina Mitchell, MD;  Location: [place] CV LAB;  Service: Cardiovascular;  Laterality: N/A;  . ARTERIAL BYPASS  SURGRY  07/05/2010   Right Common Femoral to below knee popliteal BPG  . BACK SURGERY     X's  2  . CARDIAC CATHETERIZATION    . CHOLECYSTECTOMY     Gall Bladder  . CYSTECTOMY Right    foot  . CYSTECTOMY Left    wrist  . INTERCOSTAL NERVE BLOCK  November 2015  . IR FLUORO GUIDE CV LINE RIGHT  03/20/2017  . IR US GUIDE VASC ACCESS RIGHT  03/20/2017  . IRRIGATION AND DEBRIDEMENT BUTTOCKS Right 09/30/2016   Procedure: DEBRIDEMENT RIGHT  BUTTOCK WOUND;  Surgeon: Georganna Skeans, MD;  Location: [place];  Service: General;  Laterality: Right;  . left foot surgery    . left wrist cyst removal Left   . PERIPHERAL VASCULAR CATHETERIZATION N/A 05/07/2016   Procedure: Abdominal Aortogram;  Surgeon: Serafina Mitchell, MD;  Location: [place] CV LAB;  Service: Cardiovascular;  Laterality: N/A;  . PERIPHERAL VASCULAR CATHETERIZATION N/A 05/07/2016   Procedure: Lower Extremity Angiography;  Surgeon: Serafina Mitchell, MD;  Location: [place] CV LAB;  Service: Cardiovascular;  Laterality: N/A;  . PERIPHERAL VASCULAR CATHETERIZATION N/A 05/07/2016   Procedure: Aortic Arch Angiography;  Surgeon: Serafina Mitchell, MD;  Location: [place] CV LAB;  Service: Cardiovascular;  Laterality: N/A;  . PERIPHERAL VASCULAR CATHETERIZATION N/A 05/07/2016   Procedure: Upper Extremity Angiography;  Surgeon: Serafina Mitchell, MD;  Location: [place] CV LAB;  Service: Cardiovascular;  Laterality: N/A;  . PERIPHERAL VASCULAR CATHETERIZATION Right 05/07/2016   Procedure: Peripheral Vascular Balloon Angioplasty;  Surgeon: Serafina Mitchell, MD;  Location: [place] CV LAB;  Service: Cardiovascular;  Laterality: Right;  subclavian  . PERIPHERAL VASCULAR CATHETERIZATION Right 05/07/2016   Procedure: Peripheral Vascular Intervention;  Surgeon: Serafina Mitchell, MD;  Location: [place] CV LAB;  Service: Cardiovascular;  Laterality: Right;  External  Iliac  . SKIN GRAFT Right 2012   [name] by Dr. Nils Pyle- Right and Left Ankle  . SPINE  SURGERY    . TONSILLECTOMY      Family History  Problem Relation Age of Onset  . Coronary artery disease Mother   . Peripheral vascular disease Mother   . Heart disease Mother        Before age 31  . Other Mother        Venous insuffiency  . Diabetes Mother   . Hyperlipidemia Mother   . Hypertension Mother   . Varicose Veins Mother   . Heart attack Mother        before age 14  . Heart disease Father   . Diabetes Father   . Diabetes Maternal Grandmother   . Diabetes Paternal Grandmother   . Diabetes Paternal Grandfather   .  Diabetes Sister   . Hypertension Sister   . Diabetes Brother   . Hypertension Brother       Social History   Social History  . Marital status: Married    Spouse name: N/A  . Number of children: N/A  . Years of education: N/A   Occupational History  . disabled    Social History Main Topics  . Smoking status: Former Smoker    Years: 30.00    Types: Cigarettes    Quit date: 02/07/2017  . Smokeless tobacco: Never Used  . Alcohol use No  . Drug use: No  . Sexual activity: Yes    Birth control/ protection: Post-menopausal   Other Topics Concern  . None   Social History Narrative  . None    Allergies  Allergen Reactions  . Penicillins Other (See Comments)    Severe Headache (not so sure about this now) Has patient had a PCN reaction causing immediate rash, facial/tongue/throat swelling, SOB or lightheadedness with hypotension: No Has patient had a PCN reaction causing severe rash involving mucus membranes or skin necrosis: No Has patient had a PCN reaction that required hospitalization: No Has patient had a PCN reaction occurring within the last 10 years: No If all of the above answers are "NO", then may proceed with Cephalosporin use.      Current Outpatient Prescriptions:  .  albuterol (PROVENTIL) 2 MG tablet, Take 2 mg by mouth 3 (three) times daily as needed for shortness of breath. , Disp: , Rfl:  .  aspirin 81 MG tablet, Take  81 mg by mouth daily. , Disp: , Rfl:  .  atorvastatin (LIPITOR) 40 MG tablet, Take 40 mg by mouth daily., Disp: , Rfl:  .  cetirizine (ZYRTEC) 10 MG tablet, Take 10 mg by mouth daily as needed for allergies. Reported on 03/27/2016, Disp: , Rfl:  .  cyclobenzaprine (FLEXERIL) 10 MG tablet, Limit 1 tablet by mouth 1  to  3 times per day if tolerated (Patient taking differently: Take 10 mg by mouth 3 (three) times daily as needed for muscle spasms. ), Disp: 90 tablet, Rfl: 0 .  furosemide (LASIX) 40 MG tablet, Take 40 mg by mouth 2 (two) times daily as needed for fluid. , Disp: , Rfl:  .  HUMALOG KWIKPEN 100 UNIT/ML KiwkPen, Inject 10 Units into the skin 3 (three) times daily. Per sliding scale, Disp: , Rfl:  .  HYDROcodone-acetaminophen (NORCO) 10-325 MG tablet, Limit 1 tablet by mouth per day or twice per day if tolerated (Patient taking differently: Take 1 tablet by mouth See admin instructions. Every 8-12 hours), Disp: 60 tablet, Rfl: 0 .  LANTUS SOLOSTAR 100 UNIT/ML Solostar Pen, Inject 36 Units into the skin 2 (two) times daily. , Disp: , Rfl:  .  LORazepam (ATIVAN) 0.5 MG tablet, Take 1 tablet 1 hour prior to PICC insertion.  May take 2nd pill at hospital if needed., Disp: 2 tablet, Rfl: 0 .  omeprazole (PRILOSEC) 40 MG capsule, Take 40 mg by mouth daily., Disp: , Rfl:  .  pregabalin (LYRICA) 100 MG capsule, Limit 1 tab by mouth twice a day to 3 times a day if tolerated (Patient taking differently: Take 100 mg by mouth 3 (three) times daily. ), Disp: 90 capsule, Rfl: 0 .  pseudoephedrine-guaifenesin (MUCINEX D) 60-600 MG per tablet, Take 1 tablet by mouth every 12 (twelve) hours as needed for congestion. Reported on 04/08/2016, Disp: , Rfl:  .  sulfamethoxazole-trimethoprim (BACTRIM DS,SEPTRA DS) 800-160  MG tablet, Take 2 tablets by mouth 2 (two) times daily., Disp: 120 tablet, Rfl: 0 .  dicyclomine (BENTYL) 10 MG capsule, Take 10 mg by mouth 4 (four) times daily as needed for spasms., Disp: , Rfl:    .  meclizine (ANTIVERT) 25 MG tablet, Take 25 mg by mouth 3 (three) times daily as needed for dizziness., Disp: , Rfl:  .  midodrine (PROAMATINE) 10 MG tablet, Take 0.5 tablets (5 mg total) by mouth 3 (three) times daily as needed (lightheadedness and/or low blood pressure). (Patient not taking: Reported on 09/04/2017), Disp: 90 tablet, Rfl: 0 .  ondansetron (ZOFRAN ODT) 4 MG disintegrating tablet, Take 1 tablet (4 mg total) by mouth every 8 (eight) hours as needed for nausea or vomiting. (Patient not taking: Reported on 09/04/2017), Disp: 20 tablet, Rfl: 0 No current facility-administered medications for this visit.   Facility-Administered Medications Ordered in Other Visits:  .  0.9 %  sodium chloride infusion, , Intravenous, Continuous, Brabham, Vance W, MD    Review of Systems  Constitutional: Negative for chills and fever.  HENT: Negative for congestion and sore throat.   Eyes: Negative for photophobia.  Respiratory: Negative for cough, shortness of breath and wheezing.   Cardiovascular: Negative for chest pain, palpitations and leg swelling.  Gastrointestinal: Negative for abdominal pain, blood in stool, constipation, nausea and vomiting.  Genitourinary: Negative for dysuria, flank pain and hematuria.  Musculoskeletal: Negative for back pain.  Skin: Positive for wound. Negative for rash.  Neurological: Negative for dizziness, weakness and headaches.  Hematological: Does not bruise/bleed easily.  Psychiatric/Behavioral: Negative for suicidal ideas.       Objective:   Physical Exam  Constitutional: She is oriented to person, place, and time. She appears well-developed and well-nourished. No distress.  HENT:  Head: Normocephalic and atraumatic.  Mouth/Throat: No oropharyngeal exudate.  Eyes: Conjunctivae and EOM are normal. No scleral icterus.  Neck: Normal range of motion. Neck supple.  Cardiovascular: Normal rate and regular rhythm.   Pulmonary/Chest: Effort normal. No  respiratory distress. She has no wheezes.  Abdominal: She exhibits no distension.  Musculoskeletal: She exhibits no edema or tenderness.  Neurological: She is alert and oriented to person, place, and time. She exhibits normal muscle tone. Coordination normal.  Skin: Skin is warm. She is not diaphoretic. There is erythema.  Psychiatric: She has a normal mood and affect. Her behavior is normal. Judgment and thought content normal.   Wound over fibula 03/05/17: some purulent material from wound     Same wound today 05/12/17:        08/12/17: purulent material in dressing      Thigh wound 08/12/17:      Wounds not examined today as they were recently wrapped.     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  We will use doxycyline though we nEVER had ANY pathogen isolated to target  Chronic wound over her buttocks:  Continue to monitor  Peripheral vascular disease post revascularization without simple options  Diabetes mellitus: Poorly controlled .  Hyperkalemia vs pseudohyperkalemia: we will let her be checked by PCP but I will get rid of bactrim in case it is playing a role here.  

## 2017-09-04 NOTE — Patient Instructions (Signed)
Make sure that SOMEONE checks your potasssium in next month because it was too high while on the bactrmi--which I am stopping

## 2017-09-05 ENCOUNTER — Encounter: Payer: Medicare Other | Admitting: Surgery

## 2017-09-05 DIAGNOSIS — E11621 Type 2 diabetes mellitus with foot ulcer: Secondary | ICD-10-CM | POA: Diagnosis not present

## 2017-09-08 NOTE — Progress Notes (Signed)
Jamie, [name] ([id number]) Visit Report for 09/05/2017 Arrival Information Details Patient Name: [name], [name]. Date of Service: 09/05/2017 9:15 AM Medical Record Number: [id number] Patient Account Number: [id number] Date of Birth/Sex: Oct 30, 1966 (52 y.o. Female) Treating RN: Cornell Barman Primary Care Violanda Bobeck: Delight Stare Other Clinician: Referring Mikiah Durall: Delight Stare Treating Raveen Wieseler/Extender: Frann Rider in Treatment: 80 Visit Information History Since Last Visit Added or deleted any medications: No Patient Arrived: Cane Any new allergies or adverse reactions: No Arrival Time: 09:39 Had a fall or experienced change in No Accompanied By: self activities of daily living that may affect Transfer Assistance: None risk of falls: Patient Identification Verified: Yes Signs or symptoms of abuse/neglect since last visito No Secondary Verification Process Completed: Yes Hospitalized since last visit: No Patient Requires Transmission-Based Precautions: No Has Dressing in Place as Prescribed: Yes Patient Has Alerts: No Pain Present Now: No Electronic Signature(s) Signed: 09/05/2017 4:13:04 PM By: Gretta Cool, [name], RN, CWS, [name], [name] Entered By: Gretta Cool, [name], RN, CWS, Kim on 09/05/2017 09:39:49 [name] ([id number]) -------------------------------------------------------------------------------- Clinic Level of Care Assessment Details Patient Name: [name], [name]. Date of Service: 09/05/2017 9:15 AM Medical Record Number: [id number] Patient Account Number: [id number] Date of Birth/Sex: 09-Aug-1966 (51 y.o. Female) Treating RN: Cornell Barman Primary Care Macy Polio: Delight Stare Other Clinician: Referring Alynna Hargrove: Delight Stare Treating Elveta Rape/Extender: Frann Rider in Treatment: 32 Clinic Level of Care Assessment Items TOOL 4 Quantity Score []  - Use when only an EandM is performed on FOLLOW-UP visit 0 ASSESSMENTS - Nursing Assessment / Reassessment []  -  Reassessment of Co-morbidities (includes updates in patient status) 0 X- 1 5 Reassessment of Adherence to Treatment Plan ASSESSMENTS - Wound and Skin Assessment / Reassessment []  - Simple Wound Assessment / Reassessment - one wound 0 X- 2 5 Complex Wound Assessment / Reassessment - multiple wounds []  - 0 Dermatologic / Skin Assessment (not related to wound area) ASSESSMENTS - Focused Assessment []  - Circumferential Edema Measurements - multi extremities 0 []  - 0 Nutritional Assessment / Counseling / Intervention []  - 0 Lower Extremity Assessment (monofilament, tuning fork, pulses) []  - 0 Peripheral Arterial Disease Assessment (using hand held doppler) ASSESSMENTS - Ostomy and/or Continence Assessment and Care []  - Incontinence Assessment and Management 0 []  - 0 Ostomy Care Assessment and Management (repouching, etc.) PROCESS - Coordination of Care X - Simple Patient / Family Education for ongoing care 1 15 []  - 0 Complex (extensive) Patient / Family Education for ongoing care X- 1 10 Staff obtains Programmer, systems, Records, Test Results / Process Orders []  - 0 Staff telephones HHA, Nursing Homes / Clarify orders / etc []  - 0 Routine Transfer to another Facility (non-emergent condition) []  - 0 Routine Hospital Admission (non-emergent condition) []  - 0 New Admissions / Biomedical engineer / Ordering NPWT, Apligraf, etc. []  - 0 Emergency Hospital Admission (emergent condition) X- 1 10 Simple Discharge Coordination [name], [name] ([id number]) []  - 0 Complex (extensive) Discharge Coordination PROCESS - Special Needs []  - Pediatric / Minor Patient Management 0 []  - 0 Isolation Patient Management []  - 0 Hearing / Language / Visual special needs []  - 0 Assessment of Community assistance (transportation, D/C planning, etc.) []  - 0 Additional assistance / Altered mentation []  - 0 Support Surface(s) Assessment (bed, cushion, seat, etc.) INTERVENTIONS - Wound Cleansing /  Measurement []  - Simple Wound Cleansing - one wound 0 X- 2 5 Complex Wound Cleansing - multiple wounds X- 1 5 Wound Imaging (photographs - any number of wounds) []  -  0 Wound Tracing (instead of photographs) []  - 0 Simple Wound Measurement - one wound X- 2 5 Complex Wound Measurement - multiple wounds INTERVENTIONS - Wound Dressings []  - Small Wound Dressing one or multiple wounds 0 X- 2 15 Medium Wound Dressing one or multiple wounds []  - 0 Large Wound Dressing one or multiple wounds []  - 0 Application of Medications - topical []  - 0 Application of Medications - injection INTERVENTIONS - Miscellaneous []  - External ear exam 0 []  - 0 Specimen Collection (cultures, biopsies, blood, body fluids, etc.) []  - 0 Specimen(s) / Culture(s) sent or taken to Lab for analysis []  - 0 Patient Transfer (multiple staff / Civil Service fast streamer / Similar devices) []  - 0 Simple Staple / Suture removal (25 or less) []  - 0 Complex Staple / Suture removal (26 or more) []  - 0 Hypo / Hyperglycemic Management (close monitor of Blood Glucose) []  - 0 Ankle / Brachial Index (ABI) - do not check if billed separately X- 1 5 Vital Signs [name], [name] ([id number]) Has the patient been seen at the hospital within the last three years: Yes Total Score: 110 Level Of Care: New/Established - Level 3 Electronic Signature(s) Signed: 09/05/2017 4:13:04 PM By: Gretta Cool, [name], RN, CWS, [name], [name] Entered By: Gretta Cool, [name], RN, CWS, Kim on 09/05/2017 09:58:50 [name] ([id number]) -------------------------------------------------------------------------------- Encounter Discharge Information Details Patient Name: [name], [name]. Date of Service: 09/05/2017 9:15 AM Medical Record Number: [id number] Patient Account Number: [id number] Date of Birth/Sex: Jul 12, 1966 (51 y.o. Female) Treating RN: Cornell Barman Primary Care Alonza Knisley: Delight Stare Other Clinician: Referring Mikea Quadros: Delight Stare Treating  Vegas Fritze/Extender: Frann Rider in Treatment: 31 Encounter Discharge Information Items Discharge Pain Level: 2 Discharge Condition: Stable Ambulatory Status: Cane Discharge Destination: Home Transportation: Private Auto Accompanied By: self Schedule Follow-up Appointment: Yes Medication Reconciliation completed and Yes provided to Patient/Care Winni Ehrhard: Provided on Clinical Summary of Care: 09/05/2017 Form Type Recipient Paper Patient RM Electronic Signature(s) Signed: 09/08/2017 9:18:03 AM By: Ruthine Dose Entered By: Ruthine Dose on 09/05/2017 10:09:23 Falter, Donda Chauncey Cruel ([id number]) -------------------------------------------------------------------------------- Lower Extremity Assessment Details Patient Name: Jamie Martyr. Date of Service: 09/05/2017 9:15 AM Medical Record Number: [id number] Patient Account Number: [id number] Date of Birth/Sex: 07-Jun-1966 (51 y.o. Female) Treating RN: Cornell Barman Primary Care Virlee Stroschein: Delight Stare Other Clinician: Referring Masie Bermingham: Delight Stare Treating Elouise Divelbiss/Extender: Frann Rider in Treatment: 32 Vascular Assessment Pulses: Dorsalis Pedis Palpable: [Right:Yes] Posterior Tibial Extremity colors, hair growth, and conditions: Extremity Color: [Right:Normal] Hair Growth on Extremity: [Right:No] Temperature of Extremity: [Right:Warm] Capillary Refill: [Right:< 3 seconds] Dependent Rubor: [Right:No] Lipodermatosclerosis: [Right:No] Toe Nail Assessment Left: Right: Thick: Yes Discolored: Yes Deformed: Yes Improper Length and Hygiene: Yes Electronic Signature(s) Signed: 09/05/2017 4:13:04 PM By: Gretta Cool, [name], RN, CWS, [name], [name] Entered By: Gretta Cool, [name], RN, CWS, Kim on 09/05/2017 09:46:14 Prashad, Herbie [name] ([id number]) -------------------------------------------------------------------------------- Multi Wound Chart Details Patient Name: Jamie Martyr. Date of Service: 09/05/2017 9:15 AM Medical Record Number:  [id number] Patient Account Number: [id number] Date of Birth/Sex: August 01, 1966 (51 y.o. Female) Treating RN: Cornell Barman Primary Care Claudia Greenley: Delight Stare Other Clinician: Referring Jaiven Graveline: Delight Stare Treating Ociel Retherford/Extender: Frann Rider in Treatment: 32 Vital Signs Height(in): 67 Pulse(bpm): 87 Weight(lbs): 137 Blood Pressure(mmHg): 132/54 Body Mass Index(BMI): 21 Temperature(F): 97.7 Respiratory Rate 16 (breaths/min): Photos: [1:No Photos] [2:No Photos] [N/A:N/A] Wound Location: [1:Right Malleolus - Lateral] [2:Right Gluteus] [N/A:N/A] Wounding Event: [1:Gradually Appeared] [2:Gradually Appeared] [N/A:N/A] Primary Etiology: [1:Diabetic Wound/Ulcer of the Lower Extremity] [2:Open Surgical Wound] [N/A:N/A] Comorbid  History: [1:Anemia, Arrhythmia, Coronary Artery Disease, Hypotension, Peripheral Arterial Disease, Type II Diabetes, Osteoarthritis] [2:Anemia, Arrhythmia, Coronary Artery Disease, Hypotension, Peripheral Arterial Disease, Type II Diabetes,  Osteoarthritis] [N/A:N/A] Date Acquired: [1:09/23/2016] [2:09/18/2016] [N/A:N/A] Weeks of Treatment: [1:32] [2:32] [N/A:N/A] Wound Status: [1:Open] [2:Open] [N/A:N/A] Measurements L x W x D [1:1.4x1.3x0.4] [2:1x1.2x1.2] [N/A:N/A] (cm) Area (cm) : [1:1.429] [2:0.942] [N/A:N/A] Volume (cm) : [1:0.572] [2:1.131] [N/A:N/A] % Reduction in Area: [1:63.60%] [2:95.10%] [N/A:N/A] % Reduction in Volume: [1:70.90%] [2:98.80%] [N/A:N/A] Starting Position 1 [1:12] (o'clock): Ending Position 1 [1:12] (o'clock): Maximum Distance 1 (cm): [1:1.2] Undermining: [1:Yes] [2:No] [N/A:N/A] Classification: [1:Grade 2] [2:Full Thickness Without Exposed Support Structures] [N/A:N/A] Exudate Amount: [1:Large] [2:Large] [N/A:N/A] Exudate Type: [1:Serous] [2:Serous] [N/A:N/A] Exudate Color: [1:amber] [2:amber] [N/A:N/A] Foul Odor After Cleansing: [1:No] [2:Yes] [N/A:N/A] Odor Anticipated Due to [1:N/A] [2:No] [N/A:N/A] Product Use: Wound  Margin: [1:Distinct, outline attached] [2:Epibole] [N/A:N/A] Granulation Amount: [1:Medium (34-66%)] [2:Small (1-33%)] [N/A:N/A] Granulation Quality: [1:Pink] [2:Red, Pink] [N/A:N/A] Necrotic Amount: [1:Medium (34-66%)] [2:Large (67-100%)] [N/A:N/A] Exposed Structures: Fat Layer (Subcutaneous Fat Layer (Subcutaneous N/A Tissue) Exposed: Yes Tissue) Exposed: Yes Fascia: No Fascia: No Tendon: No Tendon: No Muscle: No Muscle: No Joint: No Joint: No Bone: No Bone: No Epithelialization: None None N/A Periwound Skin Texture: Induration: Yes Scarring: Yes N/A Excoriation: No Callus: No Crepitus: No Rash: No Scarring: No Periwound Skin Moisture: Maceration: Yes Maceration: Yes N/A Dry/Scaly: No Periwound Skin Color: Erythema: Yes Ecchymosis: Yes N/A Atrophie Blanche: No Cyanosis: No Ecchymosis: No Hemosiderin Staining: No Mottled: No Pallor: No Rubor: No Erythema Location: Circumferential N/A N/A Temperature: No Abnormality No Abnormality N/A Tenderness on Palpation: Yes Yes N/A Wound Preparation: Ulcer Cleansing: Ulcer Cleansing: N/A Rinsed/Irrigated with Saline Rinsed/Irrigated with Saline Topical Anesthetic Applied: Topical Anesthetic Applied: Other: lidocaine 4% Other: lidocaine 4% Treatment Notes Wound #1 (Right, Lateral Malleolus) 1. Cleansed with: Clean wound with Normal Saline 2. Anesthetic Topical Lidocaine 4% cream to wound bed prior to debridement 3. Peri-wound Care: Skin Prep 4. Dressing Applied: Other dressing (specify in notes) 5. Secondary Dressing Applied Bordered Foam Dressing Dry Gauze Notes [facility] Wound #2 (Right Gluteus) 1. Cleansed with: Clean wound with Normal Saline 2. Anesthetic Topical Lidocaine 4% cream to wound bed prior to debridement 3. Peri-wound Care: [name], [name] ([id number]) Skin Prep 4. Dressing Applied: Other dressing (specify in notes) 5. Secondary Dressing Applied Bordered Foam Dressing Dry  Gauze Notes [facility] Electronic Signature(s) Signed: 09/05/2017 10:02:25 AM By: Christin Fudge MD, FACS Entered By: Christin Fudge on 09/05/2017 10:02:24 Denny, Herbie [name] ([id number]) -------------------------------------------------------------------------------- [facility] Details Patient Name: [name], [name]. Date of Service: 09/05/2017 9:15 AM Medical Record Number: [id number] Patient Account Number: [id number] Date of Birth/Sex: 08-31-1966 (51 y.o. Female) Treating RN: Cornell Barman Primary Care Tywana Robotham: Delight Stare Other Clinician: Referring Marcellene Shivley: Delight Stare Treating Daemyn Gariepy/Extender: Frann Rider in Treatment: 53 Active Inactive ` Orientation to the Wound Care Program Nursing Diagnoses: Knowledge deficit related to the wound healing center program Goals: Patient/caregiver will verbalize understanding of the [facility] Program Date Initiated: 01/23/2017 Target Resolution Date: 05/25/2017 Goal Status: Active Interventions: Provide education on orientation to the wound center Notes: ` Peripheral Neuropathy Nursing Diagnoses: Knowledge deficit related to disease process and management of peripheral neurovascular dysfunction Potential alteration in peripheral tissue perfusion (select prior to confirmation of diagnosis) Goals: Patient/caregiver will verbalize understanding of disease process and disease management Date Initiated: 01/23/2017 Target Resolution Date: 05/25/2017 Goal Status: Active Interventions: Assess signs and symptoms of neuropathy upon admission and as needed Provide education on Management of  Neuropathy and Related Ulcers Provide education on Management of Neuropathy upon discharge from the [facility]: Test ordered outside of clinic : 01/23/2017 Notes: ` Pressure Nursing Diagnoses: Knowledge deficit related to causes and risk factors for pressure ulcer development Knowledge deficit related to  management of pressures ulcers Duley, Destenee S. ([id number]) Potential for impaired tissue integrity related to pressure, friction, moisture, and shear Goals: Patient will remain free from development of additional pressure ulcers Date Initiated: 01/23/2017 Target Resolution Date: 05/25/2017 Goal Status: Active Patient will remain free of pressure ulcers Date Initiated: 01/23/2017 Target Resolution Date: 05/25/2017 Goal Status: Active Patient/caregiver will verbalize risk factors for pressure ulcer development Date Initiated: 01/23/2017 Target Resolution Date: 05/25/2017 Goal Status: Active Patient/caregiver will verbalize understanding of pressure ulcer management Date Initiated: 01/23/2017 Target Resolution Date: 05/25/2017 Goal Status: Active Interventions: Assess: immobility, friction, shearing, incontinence upon admission and as needed Assess offloading mechanisms upon admission and as needed Assess potential for pressure ulcer upon admission and as needed Provide education on pressure ulcers Treatment Activities: Patient referred for pressure reduction/relief devices : 01/23/2017 Patient referred for seating evaluation to ensure proper offloading : 01/23/2017 Pressure reduction/relief device ordered : 01/23/2017 Notes: ` Wound/Skin Impairment Nursing Diagnoses: Impaired tissue integrity Knowledge deficit related to smoking impact on wound healing Knowledge deficit related to ulceration/compromised skin integrity Goals: Patient/caregiver will verbalize understanding of skin care regimen Date Initiated: 01/23/2017 Target Resolution Date: 05/25/2017 Goal Status: Active Ulcer/skin breakdown will have a volume reduction of 30% by week 4 Date Initiated: 01/23/2017 Target Resolution Date: 05/25/2017 Goal Status: Active Ulcer/skin breakdown will have a volume reduction of 50% by week 8 Date Initiated: 01/23/2017 Target Resolution Date: 05/25/2017 Goal Status: Active Ulcer/skin breakdown will have a  volume reduction of 80% by week 12 Date Initiated: 01/23/2017 Target Resolution Date: 05/25/2017 Goal Status: Active Ulcer/skin breakdown will heal within 14 weeks Date Initiated: 01/23/2017 Target Resolution Date: 05/25/2017 ANALUCIA, HUSH ([id number]) Goal Status: Active Interventions: Assess patient/caregiver ability to obtain necessary supplies Assess patient/caregiver ability to perform ulcer/skin care regimen upon admission and as needed Assess ulceration(s) every visit Provide education on smoking Provide education on ulcer and skin care Treatment Activities: Patient referred to home care : 01/23/2017 Referred to DME Levia Waltermire for dressing supplies : 01/23/2017 Skin care regimen initiated : 01/23/2017 Topical wound management initiated : 01/23/2017 Notes: Electronic Signature(s) Signed: 09/05/2017 4:13:04 PM By: Gretta Cool, [name], RN, CWS, [name], [name] Entered By: Gretta Cool, [name], RN, CWS, Kim on 09/05/2017 09:46:21 Resor, Herbie [name] ([id number]) -------------------------------------------------------------------------------- Pain Assessment Details Patient Name: [name], [name]. Date of Service: 09/05/2017 9:15 AM Medical Record Number: [id number] Patient Account Number: [id number] Date of Birth/Sex: [birth year] (51 y.o. Female) Treating RN: Cornell Barman Primary Care Gean Larose: Delight Stare Other Clinician: Referring Cynara Tatham: Delight Stare Treating Cordero Surette/Extender: Frann Rider in Treatment: 32 Active Problems Location of Pain Severity and Description of Pain Patient Has Paino No Site Locations With Dressing Change: No Pain Management and Medication Current Pain Management: Goals for Pain Management Topical or injectable lidocaine is offered to patient for acute pain when surgical debridement is performed. If needed, Patient is instructed to use over the counter pain medication for the following 24-48 hours after debridement. Wound care MDs do not prescribed pain medications. Patient has  chronic pain or uncontrolled pain. Patient has been instructed to make an appointment with their Primary Care Physician for pain management. Electronic Signature(s) Signed: 09/05/2017 4:13:04 PM By: Gretta Cool, [name], RN, CWS, [name], [name] Entered By: Gretta Cool, [name], RN,  CWS, Kim on 09/05/2017 09:39:58 [name], [name] ([id number]) -------------------------------------------------------------------------------- Patient/Caregiver Education Details Patient Name: KEWANDA, POLAND. Date of Service: 09/05/2017 9:15 AM Medical Record Number: [id number] Patient Account Number: [id number] Date of Birth/Gender: Mar 03, 1966 (51 y.o. Female) Treating RN: Cornell Barman Primary Care Physician: Delight Stare Other Clinician: Referring Physician: Delight Stare Treating Physician/Extender: Frann Rider in Treatment: 25 Education Assessment Education Provided To: Patient Education Topics Provided Smoking and Wound Healing: Handouts: Smoking and Wound Healing Methods: Demonstration Responses: State content correctly Wound/Skin Impairment: Handouts: Caring for Your Ulcer Methods: Demonstration Responses: State content correctly Electronic Signature(s) Signed: 09/05/2017 4:13:04 PM By: Gretta Cool, [name], RN, CWS, [name], [name] Entered By: Gretta Cool, [name], RN, CWS, Kim on 09/05/2017 10:00:11 Boys, Herbie [name] ([id number]) -------------------------------------------------------------------------------- Wound Assessment Details Patient Name: [name], [name]. Date of Service: 09/05/2017 9:15 AM Medical Record Number: [id number] Patient Account Number: [id number] Date of Birth/Sex: 12/31/1965 (51 y.o. Female) Treating RN: Cornell Barman Primary Care Markeith Jue: Delight Stare Other Clinician: Referring Catherene Kaleta: Delight Stare Treating Aspen Lawrance/Extender: Frann Rider in Treatment: 32 Wound Status Wound Number: 1 Primary Diabetic Wound/Ulcer of the Lower Extremity Etiology: Wound Location: Right Malleolus - Lateral Wound  Open Wounding Event: Gradually Appeared Status: Date Acquired: 09/23/2016 Comorbid Anemia, Arrhythmia, Coronary Artery Disease, Weeks Of Treatment: 32 History: Hypotension, Peripheral Arterial Disease, Type Clustered Wound: No II Diabetes, Osteoarthritis Photos Photo Uploaded By: Gretta Cool, [name], RN, CWS, Kim on 09/05/2017 10:48:48 Wound Measurements Length: (cm) 1.4 % Reducti Width: (cm) 1.3 % Reducti Depth: (cm) 0.4 Epithelia Area: (cm) 1.429 Tunnelin Volume: (cm) 0.572 Undermin Starti Ending Maximu on in Area: 63.6% on in Volume: 70.9% lization: None g: No ing: Yes ng Position (o'clock): 12 Position (o'clock): 12 m Distance: (cm) 1.2 Wound Description Classification: Grade 2 Foul Odor Wound Margin: Distinct, outline attached Slough/Fi Exudate Amount: Large Exudate Type: Serous Exudate Color: amber After Cleansing: No brino Yes Wound Bed Granulation Amount: Medium (34-66%) Exposed Structure Granulation Quality: Pink Fascia Exposed: No Necrotic Amount: Medium (34-66%) Fat Layer (Subcutaneous Tissue) Exposed: Yes Necrotic Quality: Adherent Slough Tendon Exposed: No Muscle Exposed: No Joint Exposed: No Bone Exposed: No Standen, Wandalee S. ([id number]) Periwound Skin Texture Texture Color No Abnormalities Noted: No No Abnormalities Noted: No Callus: No Atrophie Blanche: No Crepitus: No Cyanosis: No Excoriation: No Ecchymosis: No Induration: Yes Erythema: Yes Rash: No Erythema Location: Circumferential Scarring: No Hemosiderin Staining: No Mottled: No Moisture Pallor: No No Abnormalities Noted: No Rubor: No Dry / Scaly: No Maceration: Yes Temperature / Pain Temperature: No Abnormality Tenderness on Palpation: Yes Wound Preparation Ulcer Cleansing: Rinsed/Irrigated with Saline Topical Anesthetic Applied: Other: lidocaine 4%, Treatment Notes Wound #1 (Right, Lateral Malleolus) 1. Cleansed with: Clean wound with Normal Saline 2. Anesthetic Topical  Lidocaine 4% cream to wound bed prior to debridement 3. Peri-wound Care: Skin Prep 4. Dressing Applied: Other dressing (specify in notes) 5. Secondary Dressing Applied Bordered Foam Dressing Dry Gauze Notes [facility] Electronic Signature(s) Signed: 09/05/2017 4:13:04 PM By: Gretta Cool, [name], RN, CWS, [name], [name] Entered By: Gretta Cool, [name], RN, CWS, Kim on 09/05/2017 09:44:48 Decelles, Herbie [name] ([id number]) -------------------------------------------------------------------------------- Wound Assessment Details Patient Name: [name], [name]. Date of Service: 09/05/2017 9:15 AM Medical Record Number: [id number] Patient Account Number: [id number] Date of Birth/Sex: [birth year] (51 y.o. Female) Treating RN: Cornell Barman Primary Care Keshawna Dix: Delight Stare Other Clinician: Referring Heavenleigh Petruzzi: Delight Stare Treating Brooklyn Jeff/Extender: Frann Rider in Treatment: 32 Wound Status Wound Number: 2 Primary Open Surgical Wound Etiology: Wound Location: Right Gluteus Wound Open Wounding Event: Gradually Appeared  Status: Date Acquired: 09/18/2016 Comorbid Anemia, Arrhythmia, Coronary Artery Disease, Weeks Of Treatment: 32 History: Hypotension, Peripheral Arterial Disease, Type Clustered Wound: No II Diabetes, Osteoarthritis Photos Photo Uploaded By: Gretta Cool, [name], RN, CWS, Kim on 09/05/2017 10:49:02 Wound Measurements Length: (cm) 1 Width: (cm) 1.2 Depth: (cm) 1.2 Area: (cm) 0.942 Volume: (cm) 1.131 % Reduction in Area: 95.1% % Reduction in Volume: 98.8% Epithelialization: None Tunneling: No Undermining: No Wound Description Full Thickness Without Exposed Support Foul Odo Classification: Structures Due to P Wound Margin: Epibole Slough/F Exudate Large Amount: Exudate Type: Serous Exudate Color: amber r After Cleansing: Yes roduct Use: No ibrino Yes Wound Bed Granulation Amount: Small (1-33%) Exposed Structure Granulation Quality: Red, Pink Fascia Exposed: No Necrotic Amount:  Large (67-100%) Fat Layer (Subcutaneous Tissue) Exposed: Yes Necrotic Quality: Adherent Slough Tendon Exposed: No Muscle Exposed: No Joint Exposed: No Bone Exposed: No Periwound Skin Texture Texture Color Kazlauskas, Loy S. ([id number]) No Abnormalities Noted: No No Abnormalities Noted: No Scarring: Yes Ecchymosis: Yes Moisture Temperature / Pain No Abnormalities Noted: No Temperature: No Abnormality Maceration: Yes Tenderness on Palpation: Yes Wound Preparation Ulcer Cleansing: Rinsed/Irrigated with Saline Topical Anesthetic Applied: Other: lidocaine 4%, Treatment Notes Wound #2 (Right Gluteus) 1. Cleansed with: Clean wound with Normal Saline 2. Anesthetic Topical Lidocaine 4% cream to wound bed prior to debridement 3. Peri-wound Care: Skin Prep 4. Dressing Applied: Other dressing (specify in notes) 5. Secondary Dressing Applied Bordered Foam Dressing Dry Gauze Notes [employer] Electronic Signature(s) Signed: 09/05/2017 4:13:04 PM By: Gretta Cool, [name], RN, CWS, [name], [name] Entered By: Gretta Cool, [name], RN, CWS, Kim on 09/05/2017 09:45:37 Nevins, Herbie [name] ([id number]) -------------------------------------------------------------------------------- Andrew Details Patient Name: Jamie Martyr. Date of Service: 09/05/2017 9:15 AM Medical Record Number: [id number] Patient Account Number: [id number] Date of Birth/Sex: 1966-06-20 (51 y.o. Female) Treating RN: Cornell Barman Primary Care Lujain Kraszewski: Delight Stare Other Clinician: Referring Storie Heffern: Delight Stare Treating Ahtziry Saathoff/Extender: Frann Rider in Treatment: 32 Vital Signs Time Taken: 09:40 Temperature (F): 97.7 Height (in): 67 Pulse (bpm): 87 Weight (lbs): 137 Respiratory Rate (breaths/min): 16 Body Mass Index (BMI): 21.5 Blood Pressure (mmHg): 132/54 Reference Range: 80 - 120 mg / dl Electronic Signature(s) Signed: 09/05/2017 4:13:04 PM By: Gretta Cool, [name], RN, CWS, [name], [name] Entered By: Gretta Cool, [name], RN, CWS, Kim on  09/05/2017 09:40:16

## 2017-09-08 NOTE — Progress Notes (Signed)
[name], [name] ([id number]) Visit Report for 09/05/2017 Chief Complaint Document Details Patient Name: [name], [name]. Date of Service: 09/05/2017 9:15 AM Medical Record Number: [id number] Patient Account Number: [id number] Date of Birth/Sex: Aug 31, 1966 (51 y.o. Female) Treating RN: Cornell Barman Primary Care Provider: Delight Stare Other Clinician: Referring Provider: Delight Stare Treating Provider/Extender: Frann Rider in Treatment: 32 Information Obtained from: Patient Chief Complaint She is here in follow up evaluation for right ischial and right lateral malleolus Electronic Signature(s) Signed: 09/05/2017 10:02:32 AM By: Christin Fudge MD, FACS Entered By: Christin Fudge on 09/05/2017 10:02:31 [name] ([id number]) -------------------------------------------------------------------------------- HPI Details Patient Name: [name], [name] Date of Service: 09/05/2017 9:15 AM Medical Record Number: [id number] Patient Account Number: [id number] Date of Birth/Sex: 1966-08-18 (51 y.o. Female) Treating RN: Cornell Barman Primary Care Provider: Delight Stare Other Clinician: Referring Provider: Delight Stare Treating Provider/Extender: Frann Rider in Treatment: 32 History of Present Illness Location: right lateral ankle and right gluteal area Quality: Patient reports experiencing a sharp pain to affected area(s). Severity: Patient states wound are getting better slowly Duration: Patient has had the wound for > 4 months prior to seeking treatment at the wound center Timing: Pain in wound is constant (hurts all the time) Context: The wound occurred when the patient was in hospital with a necrotizing fasciitis of the right gluteal area and a ulceration on the right ankle Modifying Factors: Other treatment(s) tried include:admitted to the hospital for IV antibiotics and a full workup and has also had a recent angiogram Associated Signs and Symptoms: Patient reports having  increase discharge. HPI Description: 51 year old patient was sent to [place] from [facility] where she was seen by Dr. Sherril Cong for a left ankle ulceration and was recently hospitalized with hypotension and sepsis. She was treated with IV antibiotics and has been scheduled to see Dr. Sharol Given. She was seen by vascular surgery who recommended a femoral bypass but surgery has been delayed until her sacral wound from last year's necrotizing fasciitis has healed. Was seeing the wound care team at [facility] but wanted to change over. She is a smoker and smokes a pack of cigarettes a day The patient was recently admitted in [place] between February 2 and February 14. She had a follow-up to see vascular surgery, orthopedic surgery and infectious disease. 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 Review of the electronic medical records reveals that Dr. 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 An MRI showed edema in the bone which was consistent with osteomyelitis and the chronic ulcer and may need surgical intervention. She also had a sacral decubitus ulcer which was treated with the wound VAC. On 01/07/2017 she was taken up by the vascular surgeon Dr. Trula Slade for an abdominal aortogram and bilateral lower extremity runoff, for a history of having bilateral femoropopliteal bypass graft as well as external iliac stenting on the right and stenting of her bypass graft. She had developed a nonhealing wound on her right ankle and there was a possibility of a femoral  occlusion. the findings were noted and the impression was a surgical revascularization with a aorto bifemoral  bypass graft. Both the femoropopliteal bypass grafts were patent. 01/31/2017 -- x-ray of the right hip and pelvis -- IMPRESSION:No radiographic evidence of acute osteomyelitis. Normal-appearing right hip joint space for age. No acute bony abnormality of the hip. 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 Since seeing her last week she has not had an appointment with infectious disease or the orthopedic specialist yet. 02/06/2017 -- the patient missed a couple of appointments yesterday due to the weather but other than that has apparently given up smoking for the last 5 days. 02/13/2017 -- she has rescheduled her infectious disease appointment and also the appointment with the orthopedic surgeon Dr. Sharol Given 03/06/2017 -- she was seen by Dr. Lucianne Lei dam of infectious disease on 03/05/2017 and he recommendedIV antibiotics but the patient did not want to have that and he has given her Augmentin and doxycycline and will reevaluate her in 2 months time. 03/13/2017 -- he saw Dr. Trula Slade regarding her vascular issues and he would like to wait to the sacral wound is completely closed before considering aortobifemoral bypass graft. He will see her back in 2 months time. She was also seen by Dr. Sharol Given of orthopedics who recommended continue wound care dressings and follow in the office in about 2 months time. he also recommended 3 view radiographs of the right ankle at follow-up. 03/28/2017 -- she did have a PICC line placed and Dr. Lucianne Lei dam has begun IV vancomycin and ceftriaxone. she is going to continue this until she sees him in approximately a month's time [name], [name] ([id number]) 04/18/2017 -- we had applied for a skin substitute for the patient's care but her copayment is going to be about $300 a piece and the patient will not be able to  afford this. 05/08/17 on evaluation today patient appears to potentially have a fungal rash in the periwound region of the sacral area. This also seems to extend into the inguinal creases and factional region as well. She is tender to palpation with light rubbing over the region of the periwound and the skin in this region does have a beefy red appearance. Everything seems to point to a fungal infection at this time. She has been tolerating the dressing changes up to this point. There is no evidence of infection otherwise. 05/16/2017 -- was seen by Dr. 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 He did not think that she is making progress and was not able to examine her sacral decubitus ulcer as the patient would not allow it. She was switched to Bactrim DS 2 tablets twice a day since finishing her antibiotics. Her ESR was 76 and a CRP was 2.8. 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 05/23/17 on evaluation today patient's wound appears to be doing about the same in regard to the ankle as well as the gluteal area. She apparently has issues with the one back staying in place and she states the sill seems to break up the inferior aspect. She does have home help coming out to apply the wound VAC. She has been tolerating the dressing changes without any problem although she has had some increased pain in the gluteal region due to a fall she sustained and she feels like she brews that area and  she had pain actually radiating down her leg which is slowly beginning to improve. There's no evidence of infection. 05/29/17 Unfortunately on evaluation today patient's wounds both in regard to the ankle as well as in regard to her gluteal region on the right appear to be measuring the same as last week's evaluation. She also  tells me that she is having soreness today in regard to the right gluteal area because she had a visitor who came to see her a couple days ago and stayed for six hours and she had to set up for that entire length of time visiting. This has called some increased discomfort. Normally she does not set up for those long periods of time. Fortunately there's no evidence of infection and no worsening of the wounds. 06/13/2017 -- the patient has had a lot of health issues including perfuse diarrhea and generalized weakness and I have instructed her to see her PCP as soon as possible and if things get worse to go to the ER. She has not brought her wound VAC today. She continues to be on oral antibiotics 06/30/17- she is here in follow up evaluation for right ischial and right lateral malleolus ulcer. She continues with negative pressure wound therapy to the ischial wound. She is currently on a diabetic therapy per infectious disease, although she does state that she inconsistently takes it. She was advised to take her antibiotic as prescribed 07/11/2017 -- she has been unable to use a wound VAC regularly because she loses this evening and it has not been possible for her to use it. 07/18/2017 -- she is much more comfortable and looks better without the wound VAC and at this stage I believe it will be better for her to return the machine and continue with local care. 08/07/2017 -- she is back up to 3 weeks due to a vacation and during this time her wound VAC was returned. She has been doing local dressings 08/15/17 on evaluation today patient appears to be doing about the same in regard to her ankle as well as right gluteal wound. She has been tolerating the dressing changes and tells me that it's mainly her gluteal wound that hurts the ankle is nontender to palpation. She rates this pain to be a one out of 10 at rest and with cleansing of the wound five out of 10 at least. No fevers, chills, nausea, or  vomiting noted at this time. She does note that home health has told her to inquire about possibly using silver nitrate on the wound edges due to the road nature of the edges. 08/29/2017 -- -- I got a note which Dr. Lucianne Lei dam sent to Dr. 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 He had recommended definitive surgery to fix this. He recommended to continue with Bactrim Right ankle x-ray -- IMPRESSION: Findings compatible with cellulitis of the lateral malleolar region. A small focus of bony destruction involving the tip of the lateral malleolus is worrisome for osteomyelitis. 09/05/2017 -- the patient was seen by Dr. Meridee Score yesterday who thought her wound looked good and he would continue local care with silver alginate dressing and follow-up in 4 weeks. He did not recommend any surgical intervention of the ankle until there is good arterial flow and he would await Dr. Stephens Shire opinion regarding this. [name], [name] ([id number]) She was also seen by infectious disease Dr. Lucianne Lei dam, who was concerned about her elevated potassium and  took her off Bactrim. he has recommended doxycycline. Electronic Signature(s) Signed: 09/05/2017 10:05:11 AM By: Christin Fudge MD, FACS Entered By: Christin Fudge on 09/05/2017 10:05:11 Nickey, [name] ([id number]) -------------------------------------------------------------------------------- Physical Exam Details Patient Name: [name], [name] Date of Service: 09/05/2017 9:15 AM Medical Record Number: [id number] Patient Account Number: [id number] Date of Birth/Sex: 05-11-1966 (51 y.o. Female) Treating RN: Cornell Barman Primary Care Provider: Delight Stare Other Clinician: Referring Provider: Delight Stare Treating Provider/Extender: Frann Rider in Treatment: 32 Constitutional . Pulse regular. Respirations normal and unlabored. Afebrile. . Eyes Nonicteric. Reactive to  light. Ears, Nose, Mouth, and Throat Lips, teeth, and gums WNL.Marland Kitchen Moist mucosa without lesions. Neck supple and nontender. No palpable supraclavicular or cervical adenopathy. Normal sized without goiter. Respiratory WNL. No retractions.. Cardiovascular Pedal Pulses WNL. No clubbing, cyanosis or edema. Lymphatic No adneopathy. No adenopathy. No adenopathy. Musculoskeletal Adexa without tenderness or enlargement.. Digits and nails w/o clubbing, cyanosis, infection, petechiae, ischemia, or inflammatory conditions.. Integumentary (Hair, Skin) No suspicious lesions. No crepitus or fluctuance. No peri-wound warmth or erythema. No masses.Marland Kitchen Psychiatric Judgement and insight Intact.. No evidence of depression, anxiety, or agitation.. Notes the gluteal wound is looking much cleaner and no sharp debridement was required today. The right ankle wound was briskly sent out with moist saline gauze and a Q-tip and all the superficial debris was removed. Electronic Signature(s) Signed: 09/05/2017 10:05:56 AM By: Christin Fudge MD, FACS Entered By: Christin Fudge on 09/05/2017 10:05:55 [name], [name] ([id number]) -------------------------------------------------------------------------------- Physician Orders Details Patient Name: [name], [name]. Date of Service: 09/05/2017 9:15 AM Medical Record Number: [id number] Patient Account Number: [id number] Date of Birth/Sex: [birth year] (51 y.o. Female) Treating RN: Cornell Barman Primary Care Provider: Delight Stare Other Clinician: Referring Provider: Delight Stare Treating Provider/Extender: Frann Rider in Treatment: 40 Verbal / Phone Orders: No Diagnosis Coding Wound Cleansing Wound #1 Right,Lateral Malleolus o Clean wound with Normal Saline. Wound #2 Right Gluteus o Clean wound with Normal Saline. Skin Barriers/Peri-Wound Care Wound #1 Right,Lateral Malleolus o Skin Prep Wound #2 Right Gluteus o Skin Prep Primary Wound  Dressing Wound #1 Right,Lateral Malleolus o [place] Non-Adherent - or equal Wound #2 Right Gluteus o [place] Non-Adherent - or equal Secondary Dressing Wound #1 Right,Lateral Malleolus o Boardered Foam Dressing Wound #2 Right Gluteus o Boardered Foam Dressing Dressing Change Frequency Wound #1 Right,Lateral Malleolus o Change Dressing Monday, Wednesday, Friday Wound #2 Right Gluteus o Change Dressing Monday, Wednesday, Friday Follow-up Appointments Wound #1 Right,Lateral Malleolus o Return Appointment in 2 weeks. Wound #2 Right Gluteus o Return Appointment in 2 weeks. Off-Loading [name], [name] ([id number]) Wound #1 Right,Lateral Malleolus o Turn and reposition every 2 hours Wound #2 Right Gluteus o Turn and reposition every 2 hours Home Health Wound #1 [place] Visits - Monday, Wednesday, Friday o Home Health Nurse may visit PRN to address patientos wound care needs. o FACE TO FACE ENCOUNTER: MEDICARE and MEDICAID PATIENTS: I certify that this patient is under my care and that I had a face-to-face encounter that meets the physician face-to-face encounter requirements with this patient on this date. The encounter with the patient was in whole or in part for the following MEDICAL CONDITION: (primary reason for [place]) MEDICAL NECESSITY: I certify, that based on my findings, NURSING services are a medically necessary home health service. HOME BOUND STATUS: I certify that my clinical findings support that this patient is homebound (i.e., Due to illness or injury, pt requires aid of supportive devices such as  crutches, cane, wheelchairs, walkers, the use of special transportation or the assistance of another person to leave their place of residence. There is a normal inability to leave the home and doing so requires considerable and taxing effort. Other absences are for medical reasons /  religious services and are infrequent or of short duration when for other reasons). o If current dressing causes regression in wound condition, may D/C ordered dressing product/s and apply Normal Saline Moist Dressing daily until next [place] / Other MD appointment. [place] of regression in wound condition at [phone]. o Please direct any NON-WOUND related issues/requests for orders to patient's Primary Care Physician Wound #2 Right [place] Visits - Monday, Wednesday, Friday o Home Health Nurse may visit PRN to address patientos wound care needs. o FACE TO FACE ENCOUNTER: MEDICARE and MEDICAID PATIENTS: I certify that this patient is under my care and that I had a face-to-face encounter that meets the physician face-to-face encounter requirements with this patient on this date. The encounter with the patient was in whole or in part for the following MEDICAL CONDITION: (primary reason for [place]) MEDICAL NECESSITY: I certify, that based on my findings, NURSING services are a medically necessary home health service. HOME BOUND STATUS: I certify that my clinical findings support that this patient is homebound (i.e., Due to illness or injury, pt requires aid of supportive devices such as crutches, cane, wheelchairs, walkers, the use of special transportation or the assistance of another person to leave their place of residence. There is a normal inability to leave the home and doing so requires considerable and taxing effort. Other absences are for medical reasons / religious services and are infrequent or of short duration when for other reasons). o If current dressing causes regression in wound condition, may D/C ordered dressing product/s and apply Normal Saline Moist Dressing daily until next [place] / Other MD appointment. [place] of regression in wound condition at  [phone]. o Please direct any NON-WOUND related issues/requests for orders to patient's Primary Care Physician Electronic Signature(s) Signed: 09/05/2017 12:29:29 PM By: Christin Fudge MD, FACS Signed: 09/05/2017 4:13:04 PM By: Gretta Cool, [name], RN, CWS, [name], [name] Entered By: Gretta Cool, [name], RN, CWS, Kim on 09/05/2017 09:58:11 [name], [name] ([id number]) -------------------------------------------------------------------------------- Problem List Details Patient Name: ZSOFIA, PROUT. Date of Service: 09/05/2017 9:15 AM Medical Record Number: [id number] Patient Account Number: [id number] Date of Birth/Sex: 28-Feb-1966 (51 y.o. 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 Date of Service: 09/05/2017 9:15 AM Medical Record Number: [id number] Patient Account Number: [id number] Date of Birth/Sex: [birth year] (51 y.o. Female) Treating RN: Cornell Barman Primary Care Provider: Delight Stare Other  Clinician: Referring Provider: Delight Stare Treating Provider/Extender:  Nene Aranas Weeks in Treatment: 20 Subjective Chief Complaint Information obtained from Patient She is here in follow up evaluation for right ischial and right lateral malleolus History of Present Illness (HPI) The following HPI elements were documented for the patient's wound: Location: right lateral ankle and right gluteal area Quality: Patient reports experiencing a sharp pain to affected area(s). Severity: Patient states wound are getting better slowly Duration: Patient has had the wound for > 4 months prior to seeking treatment at the wound center Timing: Pain in wound is constant (hurts all the time) Context: The wound occurred when the patient was in hospital with a necrotizing fasciitis of the right gluteal area and a ulceration on the right ankle Modifying Factors: Other treatment(s) tried include:admitted to the hospital for IV antibiotics and a full workup and has also had a recent angiogram Associated Signs and Symptoms: Patient reports having increase discharge. 51 year old patient was sent to [place] from [facility] where she was seen by Dr. Sherril Cong for a left ankle ulceration and was recently hospitalized with hypotension and sepsis. She was treated with IV antibiotics and has been scheduled to see Dr. Sharol Given. She was seen by vascular surgery who recommended a femoral bypass but surgery has been delayed until her sacral wound from last year's necrotizing fasciitis has healed. Was seeing the wound care team at [facility] but wanted to change over. She is a smoker and smokes a pack of cigarettes a day The patient was recently admitted in [place] between February 2 and February 14. She had a follow-up to see vascular surgery, orthopedic surgery and infectious disease. During her admission she was known to have peripheral arterial disease, diabetes mellitus with neuropathy, chronic pain, open wound  with necrotizing fasciitis of the sacral area which had been there since November 2017 Past medical history significant for diabetes mellitus, ankle ulcer, sacral ulcer, necrotizing fasciitis, arterial occlusive disease, tobacco abuse. Review of the electronic medical records reveals that Dr. 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 An MRI showed edema in the bone which was consistent with osteomyelitis and the chronic ulcer and may need surgical intervention. She also had a sacral decubitus ulcer which was treated with the wound VAC. On 01/07/2017 she was taken up by the vascular surgeon Dr. Trula Slade for an abdominal aortogram and bilateral lower extremity runoff, for a history of having bilateral femoropopliteal bypass graft as well as external iliac stenting on the right and stenting of her bypass graft. She had developed a nonhealing wound on her right ankle and there was a possibility of a femoral occlusion. the findings were noted and the impression was a surgical revascularization with a aorto bifemoral bypass graft. Both the femoropopliteal bypass grafts were patent. 01/31/2017 -- x-ray of the right hip and pelvis -- IMPRESSION:No radiographic evidence of acute osteomyelitis. Normal-appearing right hip joint space for age. No acute bony abnormality of the hip. 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 Since seeing her last week she has not had an appointment with infectious disease or the orthopedic specialist yet. 02/06/2017 -- the patient missed a couple of appointments yesterday due to the weather but other than that has apparently given up smoking for the last 5 days. 02/13/2017 -- she has rescheduled her infectious disease appointment and also the appointment with the  orthopedic surgeon Dr. Dierdre Forth, [name]  ([id number]) 03/06/2017 -- she was seen by Dr. Lucianne Lei dam of infectious disease on 03/05/2017 and he recommendedIV antibiotics but the patient did not want to have that and he has given her Augmentin and doxycycline and will reevaluate her in 2 months time. 03/13/2017 -- he saw Dr. Trula Slade regarding her vascular issues and he would like to wait to the sacral wound is completely closed before considering aortobifemoral bypass graft. He will see her back in 2 months time. She was also seen by Dr. Sharol Given of orthopedics who recommended continue wound care dressings and follow in the office in about 2 months time. he also recommended 3 view radiographs of the right ankle at follow-up. 03/28/2017 -- she did have a PICC line placed and Dr. Lucianne Lei dam has begun IV vancomycin and ceftriaxone. she is going to continue this until she sees him in approximately a month's time 04/18/2017 -- we had applied for a skin substitute for the patient's care but her copayment is going to be about $300 a piece and the patient will not be able to afford this. 05/08/17 on evaluation today patient appears to potentially have a fungal rash in the periwound region of the sacral area. This also seems to extend into the inguinal creases and factional region as well. She is tender to palpation with light rubbing over the region of the periwound and the skin in this region does have a beefy red appearance. Everything seems to point to a fungal infection at this time. She has been tolerating the dressing changes up to this point. There is no evidence of infection otherwise. 05/16/2017 -- was seen by Dr. 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 He did not think that she is making progress and was not able to examine her sacral decubitus ulcer as the patient would not allow it. She was switched to Bactrim DS 2 tablets twice a day since finishing her  antibiotics. Her ESR was 76 and a CRP was 2.8. 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 05/23/17 on evaluation today patient's wound appears to be doing about the same in regard to the ankle as well as the gluteal area. She apparently has issues with the one back staying in place and she states the sill seems to break up the inferior aspect. She does have home help coming out to apply the wound VAC. She has been tolerating the dressing changes without any problem although she has had some increased pain in the gluteal region due to a fall she sustained and she feels like she brews that area and she had pain actually radiating down her leg which is slowly beginning to improve. There's no evidence of infection. 05/29/17 Unfortunately on evaluation today patient's wounds both in regard to the ankle as well as in regard to her gluteal region on the right appear to be measuring the same as last week's evaluation. She also tells me that she is having soreness today in regard to the right gluteal area because she had a visitor who came to see her a couple days ago and stayed for six hours and she had to set up for that entire length of time visiting. This has called some increased discomfort. Normally she does not set up for those long periods  of time. Fortunately there's no evidence of infection and no worsening of the wounds. 06/13/2017 -- the patient has had a lot of health issues including perfuse diarrhea and generalized weakness and I have instructed her to see her PCP as soon as possible and if things get worse to go to the ER. She has not brought her wound VAC today. She continues to be on oral antibiotics 06/30/17- she is here in follow up evaluation for right ischial and right lateral malleolus ulcer. She continues with negative pressure wound therapy to the ischial wound. She is currently on  a diabetic therapy per infectious disease, although she does state that she inconsistently takes it. She was advised to take her antibiotic as prescribed 07/11/2017 -- she has been unable to use a wound VAC regularly because she loses this evening and it has not been possible for her to use it. 07/18/2017 -- she is much more comfortable and looks better without the wound VAC and at this stage I believe it will be better for her to return the machine and continue with local care. 08/07/2017 -- she is back up to 3 weeks due to a vacation and during this time her wound VAC was returned. She has been doing local dressings 08/15/17 on evaluation today patient appears to be doing about the same in regard to her ankle as well as right gluteal wound. She has been tolerating the dressing changes and tells me that it's mainly her gluteal wound that hurts the ankle is nontender to palpation. She rates this pain to be a one out of 10 at rest and with cleansing of the wound five out of 10 at least. No fevers, chills, nausea, or vomiting noted at this time. She does note that home health has told her to inquire about possibly using silver nitrate on the wound edges due to the road nature of the edges. 08/29/2017 -- -- I got a note which Dr. Lucianne Lei dam sent to Dr. Sharol Given regarding plain film showing osteomyelitis in the lateral [name], [name]. ([id number]) malleolus consistent with osteomyelitis which was worrisome. He had recommended definitive surgery to fix this. He recommended to continue with Bactrim Right ankle x-ray -- IMPRESSION: Findings compatible with cellulitis of the lateral malleolar region. A small focus of bony destruction involving the tip of the lateral malleolus is worrisome for osteomyelitis. 09/05/2017 -- the patient was seen by Dr. Meridee Score yesterday who thought her wound looked good and he would continue local care with silver alginate dressing and follow-up in 4 weeks. He did not recommend  any surgical intervention of the ankle until there is good arterial flow and he would await Dr. Stephens Shire opinion regarding this. She was also seen by infectious disease Dr. Lucianne Lei dam, who was concerned about her elevated potassium and took her off Bactrim. he has recommended doxycycline. Objective Constitutional Pulse regular. Respirations normal and unlabored. Afebrile. Vitals Time Taken: 9:40 AM, Height: 67 in, Weight: 137 lbs, BMI: 21.5, Temperature: 97.7 F, Pulse: 87 bpm, Respiratory Rate: 16 breaths/min, Blood Pressure: 132/54 mmHg. Eyes Nonicteric. Reactive to light. Ears, Nose, Mouth, and Throat Lips, teeth, and gums WNL.Marland Kitchen Moist mucosa without lesions. Neck supple and nontender. No palpable supraclavicular or cervical adenopathy. Normal sized without goiter. Respiratory WNL. No retractions.. Cardiovascular Pedal Pulses WNL. No clubbing, cyanosis or edema. Lymphatic No adneopathy. No adenopathy. No adenopathy. Musculoskeletal Adexa without tenderness or enlargement.. Digits and nails w/o clubbing, cyanosis, infection, petechiae, ischemia, or inflammatory conditions.Marland Kitchen Psychiatric Judgement and insight  Intact.. No evidence of depression, anxiety, or agitation.. General Notes: the gluteal wound is looking much cleaner and no sharp debridement was required today. The right ankle wound was briskly sent out with moist saline gauze and a Q-tip and all the superficial debris was removed. Integumentary (Hair, Skin) No suspicious lesions. No crepitus or fluctuance. No peri-wound warmth or erythema. No masses.Marland Kitchen [name], [name] ([id number]) Wound #1 status is Open. Original cause of wound was Gradually Appeared. The wound is located on the Right,Lateral Malleolus. The wound measures 1.4cm length x 1.3cm width x 0.4cm depth; 1.429cm^2 area and 0.572cm^3 volume. There is Fat Layer (Subcutaneous Tissue) Exposed exposed. There is no tunneling noted, however, there is undermining starting  at 12:00 and ending at 12:00 with a maximum distance of 1.2cm. There is a large amount of serous drainage noted. The wound margin is distinct with the outline attached to the wound base. There is medium (34-66%) pink granulation within the wound bed. There is a medium (34-66%) amount of necrotic tissue within the wound bed including Adherent Slough. The periwound skin appearance exhibited: Induration, Maceration, Erythema. The periwound skin appearance did not exhibit: Callus, Crepitus, Excoriation, Rash, Scarring, Dry/Scaly, Atrophie Blanche, Cyanosis, Ecchymosis, Hemosiderin Staining, Mottled, Pallor, Rubor. The surrounding wound skin color is noted with erythema which is circumferential. Periwound temperature was noted as No Abnormality. The periwound has tenderness on palpation. Wound #2 status is Open. Original cause of wound was Gradually Appeared. The wound is located on the Right Gluteus. The wound measures 1cm length x 1.2cm width x 1.2cm depth; 0.942cm^2 area and 1.131cm^3 volume. There is Fat Layer (Subcutaneous Tissue) Exposed exposed. There is no tunneling or undermining noted. There is a large amount of serous drainage noted. Foul odor after cleansing was noted. The wound margin is epibole. There is small (1-33%) red, pink granulation within the wound bed. There is a large (67-100%) amount of necrotic tissue within the wound bed including Adherent Slough. The periwound skin appearance exhibited: Scarring, Maceration, Ecchymosis. Periwound temperature was noted as No Abnormality. The periwound has tenderness on palpation. 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 Wound #2 Right Gluteus: Clean wound with Normal Saline. Skin Barriers/Peri-Wound Care: Wound #1 Right,Lateral Malleolus: Skin Prep Wound #2 Right Gluteus: Skin Prep Primary Wound Dressing: Wound #1 Right,Lateral Malleolus: Silvercel Non-Adherent - or equal Wound #2 Right Gluteus: Silvercel Non-Adherent - or equal Secondary Dressing: Campanella, Saranya S. ([id number]) Wound #1 Right,Lateral Malleolus: Boardered Foam Dressing Wound #2 Right Gluteus: Boardered Foam Dressing Dressing Change Frequency: Wound #1 Right,Lateral Malleolus: Change Dressing Monday, Wednesday, Friday Wound #2 Right Gluteus: Change Dressing Monday, Wednesday, Friday Follow-up Appointments: Wound #1 Right,Lateral Malleolus: Return Appointment in 2 weeks. Wound #2 Right Gluteus: Return Appointment in 2 weeks. Off-Loading: Wound #1 Right,Lateral Malleolus: Turn and reposition every 2 hours Wound #2 Right Gluteus: Turn and reposition every 2 hours Home Health: Wound #1 Right,Lateral Malleolus: [place] Visits - Monday, Wednesday, Friday [place] Nurse may visit PRN to address patient s wound care needs. FACE TO FACE ENCOUNTER: MEDICARE and MEDICAID PATIENTS: I certify that this  patient is under my care and that I had a face-to-face encounter that meets the physician face-to-face encounter requirements with this patient on this date. The encounter with the patient was in whole or in part for the following MEDICAL CONDITION: (primary reason for [place]) MEDICAL NECESSITY: I certify, that based on my findings, NURSING services are a medically necessary home health service. HOME BOUND STATUS: I certify that my clinical findings support that this patient is homebound (i.e., Due to illness or injury, pt requires aid of supportive  devices such as crutches, cane, wheelchairs, walkers, the use of special transportation or the assistance of another person to leave their place of residence. There is a normal inability to leave the home and doing so requires considerable and taxing effort. Other absences are for medical reasons / religious services and are infrequent or of short duration when for other reasons). If current dressing causes regression in wound condition, may D/C ordered dressing product/s and apply Normal Saline Moist Dressing daily until next [place] / Other MD appointment. [place] of regression in wound condition at [phone]. Please direct any NON-WOUND related issues/requests for orders to patient's Primary Care Physician Wound #2 Right Gluteus: [place] Visits - Monday, Wednesday, Friday Home Health Nurse may visit PRN to address patient s wound care needs. FACE TO FACE ENCOUNTER: MEDICARE and MEDICAID PATIENTS: I certify that this patient is under my care and that I had a face-to-face encounter that meets the physician face-to-face encounter requirements with this patient on this date. The encounter with the patient was in whole or in part for the following MEDICAL CONDITION: (primary reason for [place]) MEDICAL NECESSITY: I certify, that based on my findings, NURSING services are a medically necessary home health service. HOME BOUND STATUS: I certify that my clinical findings support that this patient is homebound (i.e., Due to illness or injury, pt requires aid of supportive devices such as crutches, cane, wheelchairs, walkers, the use of special transportation or the assistance of another person to leave their place of residence. There is a normal inability to leave the home and doing so requires considerable and taxing effort. Other absences are for medical reasons / religious services and are infrequent or of short duration when for other  reasons). If current dressing causes regression in wound condition, may D/C ordered dressing product/s and apply Normal Saline Moist Dressing daily until next [place] / Other MD appointment. [place] of regression in wound condition at [phone]. Please direct any NON-WOUND related issues/requests for orders to patient's Primary Care Physician I have reviewed her recent consultations with infectious disease and orthopedics. LEATHIA, FARNELL ([id number]) After review today I have recommended: 1. Silver alginate to be applied to the right ankle, changed every other day 2. Silver alginate with a gauze bolster to the right gluteal region to be changed 3 times a week. 3. adequate protein, vitamin A, vitamin C and zinc 4. She will return to see as in 2 weeks as she is going to be away on vacation Electronic Signature(s) Signed: 09/05/2017 10:07:16 AM By: Christin Fudge MD, FACS Entered By: Christin Fudge on 09/05/2017 10:07:16 [place], [name] ([id number]) -------------------------------------------------------------------------------- SuperBill Details Patient Name: [name] Date of Service: 09/05/2017 Medical Record Number: [id number] Patient Account Number: [id number] Date of Birth/Sex: 08-Dec-1965 (51 y.o. 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 F17.218 Nicotine dependence, cigarettes, with other nicotine-induced disorders I70.233 Atherosclerosis of native arteries of right leg with ulceration of ankle M86.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, right ankle and foot Facility Procedures CPT4  Code: 02284069 Description: 99213 - WOUND CARE VISIT-LEV 3 EST PT Modifier: Quantity: 1 Physician Procedures CPT4 Code: [id number] Description: 73543 - WC PHYS LEVEL 3 - EST PT ICD-10 Diagnosis Description E11.621 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

## 2017-09-17 ENCOUNTER — Ambulatory Visit: Payer: Medicare Other | Admitting: Neurology

## 2017-09-26 ENCOUNTER — Encounter: Payer: Medicare Other | Attending: Surgery | Admitting: Surgery

## 2017-09-26 DIAGNOSIS — I70233 Atherosclerosis of native arteries of right leg with ulceration of ankle: Secondary | ICD-10-CM | POA: Diagnosis not present

## 2017-09-26 DIAGNOSIS — F17218 Nicotine dependence, cigarettes, with other nicotine-induced disorders: Secondary | ICD-10-CM | POA: Insufficient documentation

## 2017-09-26 DIAGNOSIS — M86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, right ankle and foot: Secondary | ICD-10-CM | POA: Diagnosis not present

## 2017-09-26 DIAGNOSIS — E11621 Type 2 diabetes mellitus with foot ulcer: Secondary | ICD-10-CM | POA: Insufficient documentation

## 2017-09-26 DIAGNOSIS — E1140 Type 2 diabetes mellitus with diabetic neuropathy, unspecified: Secondary | ICD-10-CM | POA: Diagnosis not present

## 2017-09-26 DIAGNOSIS — L97312 Non-pressure chronic ulcer of right ankle with fat layer exposed: Secondary | ICD-10-CM | POA: Diagnosis not present

## 2017-09-26 DIAGNOSIS — E1151 Type 2 diabetes mellitus with diabetic peripheral angiopathy without gangrene: Secondary | ICD-10-CM | POA: Insufficient documentation

## 2017-09-26 DIAGNOSIS — L89314 Pressure ulcer of right buttock, stage 4: Secondary | ICD-10-CM | POA: Insufficient documentation

## 2017-09-28 NOTE — Progress Notes (Signed)
Jamie, [name] ([id number]) Visit Report for 09/26/2017 Chief Complaint Document Details Patient Name: [name], [name]. Date of Service: 09/26/2017 1:45 PM Medical Record Number: [id number] Patient Account Number: [id number] Date of Birth/Sex: 27-Dec-1965 (51 y.o. Female) Treating RN: Cornell Barman Primary Care Provider: Delight Stare Other Clinician: Referring Provider: Delight Stare Treating Provider/Extender: Frann Rider in Treatment: 35 Information Obtained from: Patient Chief Complaint She is here in follow up evaluation for right ischial and right lateral malleolus Electronic Signature(s) Signed: 09/26/2017 2:32:35 PM By: Christin Fudge MD, FACS Entered By: Christin Fudge on 09/26/2017 14:32:34 Imel, [name] ([id number]) -------------------------------------------------------------------------------- HPI Details Patient Name: Jamie, [name]. Date of Service: 09/26/2017 1:45 PM Medical Record Number: [id number] Patient Account Number: [id number] Date of Birth/Sex: Oct 06, 1966 (51 y.o. Female) Treating RN: Cornell Barman Primary Care Provider: Delight Stare Other Clinician: Referring Provider: Delight Stare Treating Provider/Extender: Frann Rider in Treatment: 35 History of Present Illness Location: right lateral ankle and right gluteal area Quality: Patient reports experiencing a sharp pain to affected area(s). Severity: Patient states wound are getting better slowly Duration: Patient has had the wound for > 4 months prior to seeking treatment at the wound center Timing: Pain in wound is constant (hurts all the time) Context: The wound occurred when the patient was in hospital with a necrotizing fasciitis of the right gluteal area and a ulceration on the right ankle Modifying Factors: Other treatment(s) tried include:admitted to the hospital for IV antibiotics and a full workup and has also had a recent angiogram Associated Signs and Symptoms: Patient reports having increase  discharge. HPI Description: 51 year old patient was sent to [place] from [facility] where she was seen by Dr. Sherril Cong for a left ankle ulceration and was recently hospitalized with hypotension and sepsis. She was treated with IV antibiotics and has been scheduled to see Dr. Sharol Given. She was seen by vascular surgery who recommended a femoral bypass but surgery has been delayed until her sacral wound from last year's necrotizing fasciitis has healed. Was seeing the wound care team at [facility] but wanted to change over. She is a smoker and smokes a pack of cigarettes a day The patient was recently admitted in [place] between February 2 and February 14. She had a follow-up to see vascular surgery, orthopedic surgery and infectious disease. 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 Review of the electronic medical records reveals that Dr. 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 An MRI showed edema in the bone which was consistent with osteomyelitis and the chronic ulcer and may need surgical intervention. She also had a sacral decubitus ulcer which was treated with the wound VAC. On 01/07/2017 she was taken up by the vascular surgeon Dr. Trula Slade for an abdominal aortogram and bilateral lower extremity runoff, for a history of having bilateral femoropopliteal bypass graft as well as external iliac stenting on the right and stenting of her bypass graft. She had developed a nonhealing wound on her right ankle and there was a possibility of a femoral occlusion. the  findings were noted and the impression was a surgical revascularization with a aorto bifemoral  bypass graft. Both the femoropopliteal bypass grafts were patent. 01/31/2017 -- x-ray of the right hip and pelvis -- IMPRESSION:No radiographic evidence of acute osteomyelitis. Normal-appearing right hip joint space for age. No acute bony abnormality of the hip. 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 Since seeing her last week she has not had an appointment with infectious disease or the orthopedic specialist yet. 02/06/2017 -- the patient missed a couple of appointments yesterday due to the weather but other than that has apparently given up smoking for the last 5 days. 02/13/2017 -- she has rescheduled her infectious disease appointment and also the appointment with the orthopedic surgeon Dr. Sharol Given 03/06/2017 -- she was seen by Dr. Lucianne Lei dam of infectious disease on 03/05/2017 and he recommendedIV antibiotics but the patient did not want to have that and he has given her Augmentin and doxycycline and will reevaluate her in 2 months time. 03/13/2017 -- he saw Dr. Trula Slade regarding her vascular issues and he would like to wait to the sacral wound is completely closed before considering aortobifemoral bypass graft. He will see her back in 2 months time. She was also seen by Dr. Sharol Given of orthopedics who recommended continue wound care dressings and follow in the office in about 2 months time. he also recommended 3 view radiographs of the right ankle at follow-up. 03/28/2017 -- she did have a PICC line placed and Dr. Lucianne Lei dam has begun IV vancomycin and ceftriaxone. she is going to continue this until she sees him in approximately a month's time [name], [name] ([id number]) 04/18/2017 -- we had applied for a skin substitute for the patient's care but her copayment is going to be about $300 a piece and the patient will not be able to afford  this. 05/08/17 on evaluation today patient appears to potentially have a fungal rash in the periwound region of the sacral area. This also seems to extend into the inguinal creases and factional region as well. She is tender to palpation with light rubbing over the region of the periwound and the skin in this region does have a beefy red appearance. Everything seems to point to a fungal infection at this time. She has been tolerating the dressing changes up to this point. There is no evidence of infection otherwise. 05/16/2017 -- was seen by Dr. 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 He did not think that she is making progress and was not able to examine her sacral decubitus ulcer as the patient would not allow it. She was switched to Bactrim DS 2 tablets twice a day since finishing her antibiotics. Her ESR was 76 and a CRP was 2.8. 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 05/23/17 on evaluation today patient's wound appears to be doing about the same in regard to the ankle as well as the gluteal area. She apparently has issues with the one back staying in place and she states the sill seems to break up the inferior aspect. She does have home help coming out to apply the wound VAC. She has been tolerating the dressing changes without any problem although she has had some increased pain in the gluteal region due to a fall she sustained and she feels like she brews that area and  she had pain actually radiating down her leg which is slowly beginning to improve. There's no evidence of infection. 05/29/17 Unfortunately on evaluation today patient's wounds both in regard to the ankle as well as in regard to her gluteal region on the right appear to be measuring the same as last week's evaluation. She also tells me  that she is having soreness today in regard to the right gluteal area because she had a visitor who came to see her a couple days ago and stayed for six hours and she had to set up for that entire length of time visiting. This has called some increased discomfort. Normally she does not set up for those long periods of time. Fortunately there's no evidence of infection and no worsening of the wounds. 06/13/2017 -- the patient has had a lot of health issues including perfuse diarrhea and generalized weakness and I have instructed her to see her PCP as soon as possible and if things get worse to go to the ER. She has not brought her wound VAC today. She continues to be on oral antibiotics 06/30/17- she is here in follow up evaluation for right ischial and right lateral malleolus ulcer. She continues with negative pressure wound therapy to the ischial wound. She is currently on a diabetic therapy per infectious disease, although she does state that she inconsistently takes it. She was advised to take her antibiotic as prescribed 07/11/2017 -- she has been unable to use a wound VAC regularly because she loses this evening and it has not been possible for her to use it. 07/18/2017 -- she is much more comfortable and looks better without the wound VAC and at this stage I believe it will be better for her to return the machine and continue with local care. 08/07/2017 -- she is back up to 3 weeks due to a vacation and during this time her wound VAC was returned. She has been doing local dressings 08/15/17 on evaluation today patient appears to be doing about the same in regard to her ankle as well as right gluteal wound. She has been tolerating the dressing changes and tells me that it's mainly her gluteal wound that hurts the ankle is nontender to palpation. She rates this pain to be a one out of 10 at rest and with cleansing of the wound five out of 10 at least. No fevers, chills, nausea, or vomiting noted  at this time. She does note that home health has told her to inquire about possibly using silver nitrate on the wound edges due to the road nature of the edges. 08/29/2017 -- -- I got a note which Dr. Lucianne Lei dam sent to Dr. 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 He had recommended definitive surgery to fix this. He recommended to continue with Bactrim Right ankle x-ray -- IMPRESSION: Findings compatible with cellulitis of the lateral malleolar region. A small focus of bony destruction involving the tip of the lateral malleolus is worrisome for osteomyelitis. 09/05/2017 -- the patient was seen by Dr. Meridee Score yesterday who thought her wound looked good and he would continue local care with silver alginate dressing and follow-up in 4 weeks. He did not recommend any surgical intervention of the ankle until there is good arterial flow and he would await Dr. Stephens Shire opinion regarding this. [name], [name] ([id number]) She was also seen by infectious disease Dr. Lucianne Lei dam, who was concerned about her elevated potassium and  took her off Bactrim. he has recommended doxycycline. 09/26/2017 -- she is back after 3 weeks as she was out of town on vacation. She has put on some weight but other than that no change in her health Electronic Signature(s) Signed: 09/26/2017 2:33:07 PM By: Christin Fudge MD, FACS Entered By: Christin Fudge on 09/26/2017 14:33:07 Magar, [name] ([id number]) -------------------------------------------------------------------------------- Physical Exam Details Patient Name: [name], [name] Date of Service: 09/26/2017 1:45 PM Medical Record Number: [id number] Patient Account Number: [id number] Date of Birth/Sex: 02-14-1966 (51 y.o. Female) Treating RN: Cornell Barman Primary Care Provider: Delight Stare Other Clinician: Referring Provider: Delight Stare Treating Provider/Extender: Frann Rider in  Treatment: 35 Constitutional . Pulse regular. Respirations normal and unlabored. Afebrile. . Eyes Nonicteric. Reactive to light. Ears, Nose, Mouth, and Throat [name], teeth, and gums WNL.Marland Kitchen Moist mucosa without lesions. Neck supple and nontender. No palpable supraclavicular or cervical adenopathy. Normal sized without goiter. Respiratory WNL. No retractions.. Cardiovascular Pedal Pulses WNL. No clubbing, cyanosis or edema. Lymphatic No adneopathy. No adenopathy. No adenopathy. Musculoskeletal Adexa without tenderness or enlargement.. Digits and nails w/o clubbing, cyanosis, infection, petechiae, ischemia, or inflammatory conditions.. Integumentary (Hair, Skin) No suspicious lesions. No crepitus or fluctuance. No peri-wound warmth or erythema. No masses.Marland Kitchen Psychiatric Judgement and insight Intact.. No evidence of depression, anxiety, or agitation.. Notes both the wounds were cleansed with moist saline gauze and there was no sharp debridement required. Electronic Signature(s) Signed: 09/26/2017 2:34:00 PM By: Christin Fudge MD, FACS Entered By: Christin Fudge on 09/26/2017 14:34:00 Carmean, [name] ([id number]) -------------------------------------------------------------------------------- Physician Orders Details Patient Name: AZIZI, BALLY. Date of Service: 09/26/2017 1:45 PM Medical Record Number: [id number] Patient Account Number: [id number] Date of Birth/Sex: 1966/03/24 (51 y.o. Female) Treating RN: Cornell Barman Primary Care Provider: Delight Stare Other Clinician: Referring Provider: Delight Stare Treating Provider/Extender: Frann Rider in Treatment: 45 Verbal / Phone Orders: No Diagnosis Coding Wound Cleansing Wound #1 Right,Lateral Malleolus o Clean wound with Normal Saline. Wound #2 Right Gluteus o Clean wound with Normal Saline. Skin Barriers/Peri-Wound Care Wound #1 Right,Lateral Malleolus o Skin Prep Wound #2 Right Gluteus o Skin Prep Primary Wound  Dressing Wound #1 Right,Lateral Malleolus o [place] Non-Adherent - or equal Wound #2 Right Gluteus o [place] Non-Adherent - or equal Secondary Dressing Wound #1 Right,Lateral Malleolus o Boardered Foam Dressing Wound #2 Right Gluteus o Boardered Foam Dressing Dressing Change Frequency Wound #1 Right,Lateral Malleolus o Change Dressing Monday, Wednesday, Friday Wound #2 Right Gluteus o Change Dressing Monday, Wednesday, Friday Follow-up Appointments Wound #1 Right,Lateral Malleolus o Return Appointment in 1 month Wound #2 Right Gluteus o Return Appointment in 1 month Off-Loading Seevers, Consuelo S. ([id number]) Wound #1 Right,Lateral Malleolus o Turn and reposition every 2 hours Wound #2 Right Gluteus o Turn and reposition every 2 hours Home Health Wound #1 [place] Visits - Monday, Wednesday, Friday o Home Health Nurse may visit PRN to address patientos wound care needs. o FACE TO FACE ENCOUNTER: MEDICARE and MEDICAID PATIENTS: I certify that this patient is under my care and that I had a face-to-face encounter that meets the physician face-to-face encounter requirements with this patient on this date. The encounter with the patient was in whole or in part for the following MEDICAL CONDITION: (primary reason for [place]) MEDICAL NECESSITY: I certify, that based on my findings, NURSING services are a medically necessary home health service. HOME BOUND STATUS: I certify that my clinical findings support that this patient is homebound (i.e., Due to  illness or injury, pt requires aid of supportive devices such as crutches, cane, wheelchairs, walkers, the use of special transportation or the assistance of another person to leave their place of residence. There is a normal inability to leave the home and doing so requires considerable and taxing effort. Other absences are for medical reasons / religious services  and are infrequent or of short duration when for other reasons). o If current dressing causes regression in wound condition, may D/C ordered dressing product/s and apply Normal Saline Moist Dressing daily until next [place] / Other MD appointment. [place] of regression in wound condition at [phone]. o Please direct any NON-WOUND related issues/requests for orders to patient's Primary Care Physician Wound #2 Right [place] Visits - Monday, Wednesday, Friday o Home Health Nurse may visit PRN to address patientos wound care needs. o FACE TO FACE ENCOUNTER: MEDICARE and MEDICAID PATIENTS: I certify that this patient is under my care and that I had a face-to-face encounter that meets the physician face-to-face encounter requirements with this patient on this date. The encounter with the patient was in whole or in part for the following MEDICAL CONDITION: (primary reason for [place]) MEDICAL NECESSITY: I certify, that based on my findings, NURSING services are a medically necessary home health service. HOME BOUND STATUS: I certify that my clinical findings support that this patient is homebound (i.e., Due to illness or injury, pt requires aid of supportive devices such as crutches, cane, wheelchairs, walkers, the use of special transportation or the assistance of another person to leave their place of residence. There is a normal inability to leave the home and doing so requires considerable and taxing effort. Other absences are for medical reasons / religious services and are infrequent or of short duration when for other reasons). o If current dressing causes regression in wound condition, may D/C ordered dressing product/s and apply Normal Saline Moist Dressing daily until next [place] / Other MD appointment. [place] of regression in wound condition at [phone]. o Please direct any  NON-WOUND related issues/requests for orders to patient's Primary Care Physician Electronic Signature(s) Signed: 09/26/2017 3:39:10 PM By: Christin Fudge MD, FACS Signed: 09/26/2017 5:10:23 PM By: Gretta Cool, [name], RN, CWS, [name], [name] Entered By: Gretta Cool, [name], RN, CWS, Kim on 09/26/2017 14:23:40 Darin, [name] ([id number]) -------------------------------------------------------------------------------- Problem List Details Patient Name: AICIA, BABINSKI. Date of Service: 09/26/2017 1:45 PM Medical Record Number: [id number] Patient Account Number: [id number] Date of Birth/Sex: Feb 07, 1966 (51 y.o. 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 Date of Service: 09/26/2017 1:45 PM Medical Record Number: [id number] Patient Account Number: [id number] Date of Birth/Sex: 11-Jun-1966 (51 y.o. Female) Treating RN: Cornell Barman Primary Care Provider:  Holderman, LINDA Other Clinician: Referring Provider: Mongiello,  LINDA Treating Provider/Extender: Frann Rider in Treatment: 35 Subjective Chief Complaint Information obtained from Patient She is here in follow up evaluation for right ischial and right lateral malleolus History of Present Illness (HPI) The following HPI elements were documented for the patient's wound: Location: right lateral ankle and right gluteal area Quality: Patient reports experiencing a sharp pain to affected area(s). Severity: Patient states wound are getting better slowly Duration: Patient has had the wound for > 4 months prior to seeking treatment at the wound center Timing: Pain in wound is constant (hurts all the time) Context: The wound occurred when the patient was in hospital with a necrotizing fasciitis of the right gluteal area and a ulceration on the right ankle Modifying Factors: Other treatment(s) tried include:admitted to the hospital for IV antibiotics and a full workup and has also had a recent angiogram Associated Signs and Symptoms: Patient reports having increase discharge. 51 year old patient was sent to [place] from [facility] where she was seen by Dr. Sherril Cong for a left ankle ulceration and was recently hospitalized with hypotension and sepsis. She was treated with IV antibiotics and has been scheduled to see Dr. Sharol Given. She was seen by vascular surgery who recommended a femoral bypass but surgery has been delayed until her sacral wound from last year's necrotizing fasciitis has healed. Was seeing the wound care team at [facility] but wanted to change over. She is a smoker and smokes a pack of cigarettes a day The patient was recently admitted in [place] between February 2 and February 14. She had a follow-up to see vascular surgery, orthopedic surgery and infectious disease. During her admission she was known to have peripheral arterial disease, diabetes mellitus with neuropathy, chronic pain, open wound with necrotizing fasciitis of the  sacral area which had been there since November 2017 Past medical history significant for diabetes mellitus, ankle ulcer, sacral ulcer, necrotizing fasciitis, arterial occlusive disease, tobacco abuse. Review of the electronic medical records reveals that Dr. 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 An MRI showed edema in the bone which was consistent with osteomyelitis and the chronic ulcer and may need surgical intervention. She also had a sacral decubitus ulcer which was treated with the wound VAC. On 01/07/2017 she was taken up by the vascular surgeon Dr. Trula Slade for an abdominal aortogram and bilateral lower extremity runoff, for a history of having bilateral femoropopliteal bypass graft as well as external iliac stenting on the right and stenting of her bypass graft. She had developed a nonhealing wound on her right ankle and there was a possibility of a femoral occlusion. the findings were noted and the impression was a surgical revascularization with a aorto bifemoral bypass graft. Both the femoropopliteal bypass grafts were patent. 01/31/2017 -- x-ray of the right hip and pelvis -- IMPRESSION:No radiographic evidence of acute osteomyelitis. Normal-appearing right hip joint space for age. No acute bony abnormality of the hip. 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 Since seeing her last week she has not had an appointment with infectious disease or the orthopedic specialist yet. 02/06/2017 -- the patient missed a couple of appointments yesterday due to the weather but other than that has apparently given up smoking for the last 5 days. 02/13/2017 -- she has  rescheduled her infectious disease appointment and also the appointment with the orthopedic surgeon Dr. Dierdre Forth, [name] ([id number]) 03/06/2017 -- she was  seen by Dr. Lucianne Lei dam of infectious disease on 03/05/2017 and he recommendedIV antibiotics but the patient did not want to have that and he has given her Augmentin and doxycycline and will reevaluate her in 2 months time. 03/13/2017 -- he saw Dr. Trula Slade regarding her vascular issues and he would like to wait to the sacral wound is completely closed before considering aortobifemoral bypass graft. He will see her back in 2 months time. She was also seen by Dr. Sharol Given of orthopedics who recommended continue wound care dressings and follow in the office in about 2 months time. he also recommended 3 view radiographs of the right ankle at follow-up. 03/28/2017 -- she did have a PICC line placed and Dr. Lucianne Lei dam has begun IV vancomycin and ceftriaxone. she is going to continue this until she sees him in approximately a month's time 04/18/2017 -- we had applied for a skin substitute for the patient's care but her copayment is going to be about $300 a piece and the patient will not be able to afford this. 05/08/17 on evaluation today patient appears to potentially have a fungal rash in the periwound region of the sacral area. This also seems to extend into the inguinal creases and factional region as well. She is tender to palpation with light rubbing over the region of the periwound and the skin in this region does have a beefy red appearance. Everything seems to point to a fungal infection at this time. She has been tolerating the dressing changes up to this point. There is no evidence of infection otherwise. 05/16/2017 -- was seen by Dr. 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 He did not think that she is making progress and was not able to examine her sacral decubitus ulcer as the patient would not allow it. She was switched to Bactrim DS 2 tablets twice a day since finishing her antibiotics. Her ESR was 76 and a CRP was  2.8. 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 05/23/17 on evaluation today patient's wound appears to be doing about the same in regard to the ankle as well as the gluteal area. She apparently has issues with the one back staying in place and she states the sill seems to break up the inferior aspect. She does have home help coming out to apply the wound VAC. She has been tolerating the dressing changes without any problem although she has had some increased pain in the gluteal region due to a fall she sustained and she feels like she brews that area and she had pain actually radiating down her leg which is slowly beginning to improve. There's no evidence of infection. 05/29/17 Unfortunately on evaluation today patient's wounds both in regard to the ankle as well as in regard to her gluteal region on the right appear to be measuring the same as last week's evaluation. She also tells me that she is having soreness today in regard to the right gluteal area because she had a visitor who came to see her a couple days ago and stayed for six hours and she had to set up for that entire length of time visiting. This has called some increased  discomfort. Normally she does not set up for those long periods of time. Fortunately there's no evidence of infection and no worsening of the wounds. 06/13/2017 -- the patient has had a lot of health issues including perfuse diarrhea and generalized weakness and I have instructed her to see her PCP as soon as possible and if things get worse to go to the ER. She has not brought her wound VAC today. She continues to be on oral antibiotics 06/30/17- she is here in follow up evaluation for right ischial and right lateral malleolus ulcer. She continues with negative pressure wound therapy to the ischial wound. She is currently on a diabetic therapy per infectious disease,  although she does state that she inconsistently takes it. She was advised to take her antibiotic as prescribed 07/11/2017 -- she has been unable to use a wound VAC regularly because she loses this evening and it has not been possible for her to use it. 07/18/2017 -- she is much more comfortable and looks better without the wound VAC and at this stage I believe it will be better for her to return the machine and continue with local care. 08/07/2017 -- she is back up to 3 weeks due to a vacation and during this time her wound VAC was returned. She has been doing local dressings 08/15/17 on evaluation today patient appears to be doing about the same in regard to her ankle as well as right gluteal wound. She has been tolerating the dressing changes and tells me that it's mainly her gluteal wound that hurts the ankle is nontender to palpation. She rates this pain to be a one out of 10 at rest and with cleansing of the wound five out of 10 at least. No fevers, chills, nausea, or vomiting noted at this time. She does note that home health has told her to inquire about possibly using silver nitrate on the wound edges due to the road nature of the edges. 08/29/2017 -- -- I got a note which Dr. Lucianne Lei dam sent to Dr. Sharol Given regarding plain film showing osteomyelitis in the lateral [place], [place]. ([id number]) malleolus consistent with osteomyelitis which was worrisome. He had recommended definitive surgery to fix this. He recommended to continue with Bactrim Right ankle x-ray -- IMPRESSION: Findings compatible with cellulitis of the lateral malleolar region. A small focus of bony destruction involving the tip of the lateral malleolus is worrisome for osteomyelitis. 09/05/2017 -- the patient was seen by Dr. Meridee Score yesterday who thought her wound looked good and he would continue local care with silver alginate dressing and follow-up in 4 weeks. He did not recommend any surgical intervention of the ankle  until there is good arterial flow and he would await Dr. Stephens Shire opinion regarding this. She was also seen by infectious disease Dr. Lucianne Lei dam, who was concerned about her elevated potassium and took her off Bactrim. he has recommended doxycycline. 09/26/2017 -- she is back after 3 weeks as she was out of town on vacation. She has put on some weight but other than that no change in her health Patient History Information obtained from Patient. Family History Diabetes - Mother,Father,Maternal Grandparents,Paternal Grandparents,Child, Heart Disease - [place] Grandparents, Hereditary Spherocytosis - Mother, Hypertension - Mother,Siblings,Paternal Grandparents,Maternal Grandparents, Seizures - Maternal Grandparents,Paternal Grandparents, Thyroid Problems - Siblings, No family history of Cancer, Kidney Disease, Lung Disease, Stroke, Tuberculosis. Social History Current every day smoker, Marital Status - Married, Alcohol Use - Rarely, Drug Use - No History, Caffeine Use -  Moderate. Objective Constitutional Pulse regular. Respirations normal and unlabored. Afebrile. Vitals Time Taken: 2:06 PM, Height: 67 in, Weight: 137 lbs, BMI: 21.5, Temperature: 98.1 F, Pulse: 60 bpm, Respiratory Rate: 16 breaths/min, Blood Pressure: 115/52 mmHg. Eyes Nonicteric. Reactive to light. Ears, Nose, Mouth, and Throat [name], teeth, and gums WNL.Marland Kitchen Moist mucosa without lesions. Neck supple and nontender. No palpable supraclavicular or cervical adenopathy. Normal sized without goiter. Respiratory WNL. No retractions.Marland Kitchen [name], [name]. ([id number]) Cardiovascular Pedal Pulses WNL. No clubbing, cyanosis or edema. Lymphatic No adneopathy. No adenopathy. No adenopathy. Musculoskeletal Adexa without tenderness or enlargement.. Digits and nails w/o clubbing, cyanosis, infection, petechiae, ischemia, or inflammatory conditions.Marland Kitchen Psychiatric Judgement and insight Intact.. No  evidence of depression, anxiety, or agitation.. General Notes: both the wounds were cleansed with moist saline gauze and there was no sharp debridement required. Integumentary (Hair, Skin) No suspicious lesions. No crepitus or fluctuance. No peri-wound warmth or erythema. No masses.. Wound #1 status is Open. Original cause of wound was Gradually Appeared. The wound is located on the Right,Lateral Malleolus. The wound measures 1cm length x 1.4cm width x 0.5cm depth; 1.1cm^2 area and 0.55cm^3 volume. There is Fat Layer (Subcutaneous Tissue) Exposed exposed. There is no tunneling noted, however, there is undermining starting at 12:00 and ending at 12:00 with a maximum distance of 0.4cm. There is a large amount of serous drainage noted. The wound margin is distinct with the outline attached to the wound base. There is medium (34-66%) pink granulation within the wound bed. There is a medium (34-66%) amount of necrotic tissue within the wound bed including Adherent Slough. The periwound skin appearance exhibited: Induration, Maceration, Erythema. The periwound skin appearance did not exhibit: Callus, Crepitus, Excoriation, Rash, Scarring, Dry/Scaly, Atrophie Blanche, Cyanosis, Ecchymosis, Hemosiderin Staining, Mottled, Pallor, Rubor. The surrounding wound skin color is noted with erythema which is circumferential. Periwound temperature was noted as No Abnormality. The periwound has tenderness on palpation. Wound #2 status is Open. Original cause of wound was Gradually Appeared. The wound is located on the Right Gluteus. The wound measures 2cm length x 1cm width x 1.2cm depth; 1.571cm^2 area and 1.885cm^3 volume. There is Fat Layer (Subcutaneous Tissue) Exposed exposed. There is no tunneling or undermining noted. There is a large amount of serous drainage noted. Foul odor after cleansing was noted. The wound margin is epibole. There is medium (34-66%) red, pink granulation within the wound bed. There is a  medium (34-66%) amount of necrotic tissue within the wound bed including Adherent Slough. The periwound skin appearance exhibited: Scarring, Maceration, Ecchymosis. Periwound temperature was noted as No Abnormality. The periwound has tenderness on palpation. 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 ([id number]) Plan Wound Cleansing: Wound #1 Right,Lateral Malleolus: Clean wound with Normal Saline. Wound #2 Right Gluteus: Clean wound with Normal Saline. Skin Barriers/Peri-Wound Care: Wound #1 Right,Lateral Malleolus: Skin Prep Wound #2 Right Gluteus: Skin Prep Primary Wound Dressing: Wound #1 Right,Lateral Malleolus: Silvercel Non-Adherent - or equal Wound #2 Right Gluteus: Silvercel Non-Adherent - or equal Secondary Dressing: Wound #1 Right,Lateral Malleolus: Boardered Foam Dressing Wound #2 Right Gluteus: Boardered Foam Dressing Dressing Change Frequency: Wound #1 Right,Lateral Malleolus: Change Dressing  Monday, Wednesday, Friday Wound #2 Right Gluteus: Change Dressing Monday, Wednesday, Friday Follow-up Appointments: Wound #1 Right,Lateral Malleolus: Return Appointment in 1 month Wound #2 Right Gluteus: Return Appointment in 1 month Off-Loading: Wound #1 Right,Lateral Malleolus: Turn and reposition every 2 hours Wound #2 Right Gluteus: Turn and reposition every 2 hours Home Health: Wound #1 Right,Lateral Malleolus: [place] Visits - Monday, Wednesday, Friday [place] Nurse may visit PRN to address patient s wound care needs. FACE TO FACE  ENCOUNTER: MEDICARE and MEDICAID PATIENTS: I certify that this patient is under my care and that I had a face-to-face encounter that meets the physician face-to-face encounter requirements with this patient on this date. The encounter with the patient was in whole or in part for the following MEDICAL CONDITION: (primary reason for [place]) MEDICAL NECESSITY: I certify, that based on my findings, NURSING services are a medically necessary home health service. HOME BOUND STATUS: I certify that my clinical findings support that this patient is homebound (i.e., Due to illness or injury, pt requires aid of supportive devices such as crutches, cane, wheelchairs, walkers, the use of special transportation or the assistance of another person to leave their place of residence. There is a normal inability to leave the home and doing so requires considerable and taxing effort. Other absences are for medical reasons / religious services and are infrequent or of short duration when for other reasons). If current dressing causes regression in wound condition, may D/C ordered dressing product/s and apply Normal Saline Moist Dressing daily until next [place] / Other MD appointment. [facility] of regression in wound condition at [phone]. Please direct any NON-WOUND related issues/requests for orders to patient's Primary Care Physician Wound #2 Right Gluteus: [place] Visits - Monday, Wednesday, Friday Home Health Nurse may visit PRN to address patient s wound care needs. FACE TO FACE ENCOUNTER: MEDICARE and MEDICAID PATIENTS: I certify that this patient is under my care and that I Fee[place], [place]. ([id number]) had a face-to-face encounter that meets the physician face-to-face encounter requirements with this patient on this date. The encounter with the patient was in whole or in part for the following MEDICAL CONDITION: (primary reason for [place])  MEDICAL NECESSITY: I certify, that based on my findings, NURSING services are a medically necessary home health service. HOME BOUND STATUS: I certify that my clinical findings support that this patient is homebound (i.e., Due to illness or injury, pt requires aid of supportive devices such as crutches, cane, wheelchairs, walkers, the use of special transportation or the assistance of another person to leave their place of residence. There is a normal inability to leave the home and doing so requires considerable and taxing effort. Other absences are for medical reasons / religious services and are infrequent or of short duration when for other reasons). If current dressing causes regression in wound condition, may D/C ordered dressing product/s and apply Normal Saline Moist Dressing daily until next [place] / Other MD appointment. [place] of regression in wound condition at [phone]. Please direct any NON-WOUND related issues/requests for orders to patient's Primary Care Physician After review today I have recommended: 1. Silver alginate to be applied to the right ankle, changed every other day 2. Silver alginate with a gauze bolster to the right gluteal region to be changed 3 times a week. 3. adequate protein, vitamin A, vitamin C and zinc 4. She will return to see as in 2-3  weeks as she is has several other appointments Electronic Signature(s) Signed: 09/26/2017 2:34:47 PM By: Christin Fudge MD, FACS Entered By: Christin Fudge on 09/26/2017 14:34:46 [name], [name] ([id number]) -------------------------------------------------------------------------------- ROS/PFSH Details Patient Name: Jamie Martyr. Date of Service: 09/26/2017 1:45 PM Medical Record Number: [id number] Patient Account Number: [id number] Date of Birth/Sex: 07-25-1966 (51 y.o. Female) Treating RN: Cornell Barman Primary Care Provider: Delight Stare Other Clinician: Referring Provider: Delight Stare Treating Provider/Extender: Frann Rider in Treatment: 35 Information Obtained From Patient Wound History Do you currently have one or more open woundso Yes How many open wounds do you currently haveo 2 Approximately how long have you had your woundso months How have you been treating your wound(s) until nowo wound vac/ silver alginate Has your wound(s) ever healed and then re-openedo No Have you had any lab work done in the past montho No Have you tested positive for an antibiotic resistant organism (MRSA, VRE)o No Have you tested positive for osteomyelitis (bone infection)o No Have you had any tests for circulation on your legso No Have you had other problems associated with your woundso Infection Eyes Medical History: Negative for: Cataracts; Glaucoma; Optic Neuritis Ear/Nose/Mouth/Throat Medical History: Negative for: Chronic sinus problems/congestion; Middle ear problems Hematologic/Lymphatic Medical History: Positive for: Anemia Negative for: Hemophilia; Human Immunodeficiency Virus; Lymphedema; Sickle Cell Disease Respiratory Medical History: Negative for: Aspiration; Asthma; Chronic Obstructive Pulmonary Disease (COPD); Pneumothorax; Sleep Apnea; Tuberculosis Cardiovascular Medical History: Positive for: Arrhythmia; Coronary Artery Disease; Hypotension; Peripheral Arterial Disease Gastrointestinal Medical History: Negative for: Cirrhosis ; Colitis; Crohnos; Hepatitis A; Hepatitis B; Hepatitis C Endocrine Medical History: Positive for: Type II Diabetes [name], [name] ([id number]) Treated with: Insulin, Oral agents Blood sugar tested every day: Yes Tested : 4 times a day Blood sugar testing results: Bedtime: 190 Genitourinary Medical History: Negative for: End Stage Renal Disease Immunological Medical History: Negative for: Lupus Erythematosus; Raynaudos; Scleroderma Integumentary (Skin) Medical History: Negative for: History of Burn; History  of pressure wounds Musculoskeletal Medical History: Positive for: Osteoarthritis Neurologic Medical History: Negative for: Dementia; Neuropathy; Quadriplegia; Paraplegia; Seizure Disorder Oncologic Medical History: Negative for: Received Chemotherapy; Received Radiation Psychiatric Medical History: Negative for: Anorexia/bulimia; Confinement Anxiety Immunizations Pneumococcal Vaccine: Received Pneumococcal Vaccination: No Implantable Devices Family and Social History Cancer: No; Diabetes: Yes - Mother,Father,Maternal Grandparents,Paternal [place]; Heart Disease: Yes - Father,Mother,Maternal Grandparents,Paternal Grandparents; Hereditary Spherocytosis: Yes - Mother; Hypertension: Yes - Mother,Siblings,Paternal Grandparents,Maternal Grandparents; Kidney Disease: No; Lung Disease: No; Seizures: Yes - Maternal Grandparents,Paternal Grandparents; Stroke: No; Thyroid Problems: Yes - Siblings; Tuberculosis: No; Current every day smoker; Marital Status - Married; Alcohol Use: Rarely; Drug Use: No History; Caffeine Use: Moderate; Financial Concerns: No; Food, Clothing or Shelter Needs: No; Support System Lacking: No; Transportation Concerns: No; Advanced Directives: No; Patient does not want information on Advanced Directives; Living Will: No Physician Affirmation I have reviewed and agree with the above information. [name], [name] ([id number]) Electronic Signature(s) Signed: 09/26/2017 3:39:10 PM By: Christin Fudge MD, FACS Signed: 09/26/2017 5:10:23 PM By: Gretta Cool [name], RN, CWS, [name], [name] Entered By: Christin Fudge on 09/26/2017 14:33:26 [name], [name] ([id number]) -------------------------------------------------------------------------------- SuperBill Details Patient Name: [name], RUBEY. Date of Service: 09/26/2017 Medical Record Number: [id number] Patient Account Number: [id number] Date of Birth/Sex: Oct 24, 1966 (51 y.o. 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 M86.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, right ankle and foot Facility Procedures CPT4 Code: [id number] Description: 99213 - WOUND CARE VISIT-LEV 3 EST PT Modifier: Quantity: 1 Physician Procedures CPT4 Code: [id number] Description: 47841 - WC PHYS LEVEL 3 - EST PT ICD-10 Diagnosis Description E11.621 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

## 2017-09-29 NOTE — Progress Notes (Addendum)
[name], [name] ([id number]) Visit Report for 09/26/2017 Arrival Information Details Patient Name: [name], [name]. Date of Service: 09/26/2017 1:45 PM Medical Record Number: [id number] Patient Account Number: [id number] Date of Birth/Sex: 04-22-1966 (51 y.o. Female) Treating RN: [name] Primary Care [name]: [name] Other Clinician: Referring [name]: [name] Treating [name]/Extender: [name] in Treatment: 35 Visit Information History Since Last Visit Added or deleted any medications: No Patient Arrived: Walker Any new allergies or adverse reactions: No Arrival Time: 14:05 Had a fall or experienced change in No Accompanied By: self activities of daily living that may affect Transfer Assistance: None risk of falls: Patient Identification Verified: Yes Signs or symptoms of abuse/neglect since last visito No Secondary Verification Process Completed: Yes Hospitalized since last visit: No Patient Requires Transmission-Based Precautions: No Has Dressing in Place as Prescribed: Yes Patient Has Alerts: No Pain Present Now: No Electronic Signature(s) Signed: 09/26/2017 5:10:23 PM By: [name], [name], RN, CWS, [name], [name] Entered By: [name], [name], RN, CWS, [name] on 09/26/2017 14:06:13 [name], [name] ([id number]) -------------------------------------------------------------------------------- Clinic Level of Care Assessment Details Patient Name: [name], [name]. Date of Service: 09/26/2017 1:45 PM Medical Record Number: [id number] Patient Account Number: [id number] Date of Birth/Sex: 29-Mar-1966 (51 y.o. Female) Treating RN: [name] Primary Care [name]: [name] Other Clinician: Referring [name]: [name] Treating [name]: [name] in Treatment: 35 Clinic Level of Care Assessment Items TOOL 4 Quantity Score []  - Use when only an EandM is performed on FOLLOW-UP visit 0 ASSESSMENTS - Nursing Assessment / Reassessment []  -  Reassessment of Co-morbidities (includes updates in patient status) 0 X- 1 5 Reassessment of Adherence to Treatment Plan ASSESSMENTS - Wound and Skin Assessment / Reassessment []  - Simple Wound Assessment / Reassessment - one wound 0 X- 2 5 Complex Wound Assessment / Reassessment - multiple wounds []  - 0 Dermatologic / Skin Assessment (not related to wound area) ASSESSMENTS - Focused Assessment []  - Circumferential Edema Measurements - multi extremities 0 []  - 0 Nutritional Assessment / Counseling / Intervention []  - 0 Lower Extremity Assessment (monofilament, tuning fork, pulses) []  - 0 Peripheral Arterial Disease Assessment (using hand held doppler) ASSESSMENTS - Ostomy and/or Continence Assessment and Care []  - Incontinence Assessment and Management 0 []  - 0 Ostomy Care Assessment and Management (repouching, etc.) PROCESS - Coordination of Care X - Simple Patient / Family Education for ongoing care 1 15 []  - 0 Complex (extensive) Patient / Family Education for ongoing care X- 1 10 Staff obtains Programmer, systems, Records, Test Results / Process Orders []  - 0 Staff telephones HHA, Nursing Homes / Clarify orders / etc []  - 0 Routine Transfer to another Facility (non-emergent condition) []  - 0 Routine Hospital Admission (non-emergent condition) []  - 0 New Admissions / Biomedical engineer / Ordering NPWT, Apligraf, etc. []  - 0 Emergency Hospital Admission (emergent condition) X- 1 10 Simple Discharge Coordination [name], [name] ([id number]) []  - 0 Complex (extensive) Discharge Coordination PROCESS - Special Needs []  - Pediatric / Minor Patient Management 0 []  - 0 Isolation Patient Management []  - 0 Hearing / Language / Visual special needs []  - 0 Assessment of Community assistance (transportation, D/C planning, etc.) []  - 0 Additional assistance / Altered mentation []  - 0 Support Surface(s) Assessment (bed, cushion, seat, etc.) INTERVENTIONS - Wound Cleansing /  Measurement []  - Simple Wound Cleansing - one wound 0 X- 2 5 Complex Wound Cleansing - multiple wounds X- 1 5 Wound Imaging (photographs - any number of wounds) []  -  0 Wound Tracing (instead of photographs) []  - 0 Simple Wound Measurement - one wound X- 2 5 Complex Wound Measurement - multiple wounds INTERVENTIONS - Wound Dressings []  - Small Wound Dressing one or multiple wounds 0 X- 2 15 Medium Wound Dressing one or multiple wounds []  - 0 Large Wound Dressing one or multiple wounds []  - 0 Application of Medications - topical []  - 0 Application of Medications - injection INTERVENTIONS - Miscellaneous []  - External ear exam 0 []  - 0 Specimen Collection (cultures, biopsies, blood, body fluids, etc.) []  - 0 Specimen(s) / Culture(s) sent or taken to Lab for analysis []  - 0 Patient Transfer (multiple staff / Civil Service fast streamer / Similar devices) []  - 0 Simple Staple / Suture removal (25 or less) []  - 0 Complex Staple / Suture removal (26 or more) []  - 0 Hypo / Hyperglycemic Management (close monitor of Blood Glucose) []  - 0 Ankle / Brachial Index (ABI) - do not check if billed separately X- 1 5 Vital Signs [name], [name] ([id number]) Has the patient been seen at the hospital within the last three years: Yes Total Score: 110 Level Of Care: New/Established - Level 3 Electronic Signature(s) Signed: 09/26/2017 5:10:23 PM By: [name], [name], RN, CWS, [name], [name] Entered By: [name], [name], RN, CWS, [name] on 09/26/2017 14:24:28 [name], [name] ([id number]) -------------------------------------------------------------------------------- Complex / Palliative Patient Assessment Details Patient Name: [name], [name] Date of Service: 09/26/2017 1:45 PM Medical Record Number: [id number] Patient Account Number: [id number] Date of Birth/Sex: 05/06/1966 (51 y.o. Female) Treating RN: [name] Primary Care Reginald Weida: [name] Other Clinician: Referring Joran Kallal: [name] Treating  Keelin Sheridan/Extender: [name] in Treatment: 35 Palliative Management Criteria Complex Wound Management Criteria Patient requires a surgical procedure in order to achieve wound healing: amputation for osteo-myelitis However, the patient does not wish to undergo the recommended surgical procedure. Care Approach Wound Care Plan: Complex Wound Management Electronic Signature(s) Signed: 10/14/2017 4:16:10 PM By: [name], [name], RN, CWS, [name], [name] Signed: 10/16/2017 4:36:30 PM By: Christin Fudge MD, FACS Entered By: [name], [name], RN, CWS, [name] on 10/14/2017 16:16:10 Masterson, [name] ([id number]) -------------------------------------------------------------------------------- Encounter Discharge Information Details Patient Name: AYAT, DRENNING. Date of Service: 09/26/2017 1:45 PM Medical Record Number: [id number] Patient Account Number: [id number] Date of Birth/Sex: November 01, 1966 (51 y.o. Female) Treating RN: [name] Primary Care Gurdeep Keesey: [name] Other Clinician: Referring Kecia Swoboda: [name] Treating Corri Delapaz/Extender: [name] in Treatment: 67 Encounter Discharge Information Items Discharge Pain Level: 0 Discharge Condition: Stable Ambulatory Status: Ambulatory Discharge Destination: Home Transportation: Private Auto Accompanied By: self Schedule Follow-up Appointment: Yes Medication Reconciliation completed and No provided to Patient/Care Orva Gwaltney: Provided on Clinical Summary of Care: 09/26/2017 Form Type Recipient Paper Patient RM Electronic Signature(s) Signed: 09/29/2017 4:27:54 PM By: Ruthine Dose Entered By: Ruthine Dose on 09/26/2017 14:33:17 [name], [name] ([id number]) -------------------------------------------------------------------------------- Lower Extremity Assessment Details Patient Name: [name]. Date of Service: 09/26/2017 1:45 PM Medical Record Number: [id number] Patient Account Number: [id number] Date of Birth/Sex:  December 14, 1965 (51 y.o. Female) Treating RN: [name] Primary Care Tiwan Schnitker: [name] Other Clinician: Referring Tewana Bohlen: [name] Treating Khi Mcmillen/Extender: [name] in Treatment: 35 Vascular Assessment Pulses: Dorsalis Pedis Palpable: [Right:Yes] Posterior Tibial Extremity colors, hair growth, and conditions: Extremity Color: [Right:Normal] Hair Growth on Extremity: [Right:No] Temperature of Extremity: [Right:Warm] Capillary Refill: [Right:< 3 seconds] Electronic Signature(s) Signed: 09/26/2017 5:10:23 PM By: [name], [name], RN, CWS, [name], [name] Entered By: [name], [name], RN, CWS, [name] on 09/26/2017 14:21:23 [name],  [name] Kitchen ([id number]) -------------------------------------------------------------------------------- Multi Wound Chart Details Patient Name: KATI, RIGGENBACH. Date of Service: 09/26/2017 1:45 PM Medical Record Number: [id number] Patient Account Number: [id number] Date of Birth/Sex: 1965/12/03 (51 y.o. 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 Comorbid History: Anemia, Arrhythmia, Coronary Anemia, Arrhythmia, Coronary N/A Artery Disease, Hypotension, Artery Disease, Hypotension, Peripheral Arterial Disease, Peripheral Arterial Disease, Type II Diabetes, Type II Diabetes, Osteoarthritis Osteoarthritis Date Acquired: 09/23/2016 09/18/2016 N/A Weeks of Treatment: 35 35 N/A Wound Status: Open Open N/A Measurements L x W x D  1x1.4x0.5 2x1x1.2 N/A (cm) Area (cm) : 1.1 1.571 N/A Volume (cm) : 0.55 1.885 N/A % Reduction in Area: 72.00% 91.80% N/A % Reduction in Volume: 72.00% 98.00% N/A Starting Position 1 12 (o'clock): Ending Position 1 12 (o'clock): Maximum Distance 1 (cm): 0.4 Undermining: Yes No N/A Classification: Grade 2 Full Thickness Without N/A Exposed Support Structures Exudate Amount: Large Large N/A Exudate Type: Serous Serous N/A Exudate Color: amber amber N/A Foul Odor After Cleansing: No Yes N/A N/A No N/A [name], [name] ([id number]) Odor Anticipated Due to Product Use: Wound Margin: Distinct, outline attached Epibole N/A Granulation Amount: Medium (34-66%) Medium (34-66%) N/A Granulation Quality: Pink Red, Pink N/A Necrotic Amount: Medium (34-66%) Medium (34-66%) N/A Exposed Structures: Fat Layer (Subcutaneous Fat Layer (Subcutaneous N/A Tissue) Exposed: Yes Tissue) Exposed: Yes Fascia: No Fascia: No Tendon: No Tendon: No Muscle: No Muscle: No Joint: No Joint: No Bone: No Bone: No Epithelialization: None None N/A Periwound Skin Texture: Induration: Yes Scarring: Yes N/A Excoriation: No Callus: No Crepitus: No Rash: No Scarring: No Periwound Skin Moisture: Maceration: Yes Maceration: Yes N/A Dry/Scaly: No Periwound Skin Color: Erythema: Yes Ecchymosis: Yes N/A Atrophie Blanche: No Cyanosis: No Ecchymosis: No Hemosiderin Staining: No Mottled: No Pallor: No Rubor: No Erythema Location: Circumferential N/A N/A Temperature: No Abnormality No Abnormality N/A Tenderness on Palpation: Yes Yes N/A Wound Preparation: Ulcer Cleansing: Ulcer Cleansing: N/A Rinsed/Irrigated with Saline Rinsed/Irrigated with Saline Topical Anesthetic Applied: Topical Anesthetic Applied: Other: lidocaine 4% Other: lidocaine 4% Treatment Notes Wound #1 (Right, Lateral Malleolus) 1. Cleansed with: Clean wound with Normal Saline 2. Anesthetic Topical Lidocaine 4% cream to wound  bed prior to debridement 3. Peri-wound Care: Skin Prep 4. Dressing Applied: Other dressing (specify in notes) 5. Secondary Dressing Applied Bordered Foam Dressing Dry Gauze Notes [facility] Wound #2 (Right Gluteus) [name], [name] ([id number]) 1. Cleansed with: Clean wound with Normal Saline 2. Anesthetic Topical Lidocaine 4% cream to wound bed prior to debridement 3. Peri-wound Care: Skin Prep 4. Dressing Applied: Other dressing (specify in notes) 5. Secondary Dressing Applied Bordered Foam Dressing Dry Gauze Notes [facility] Electronic Signature(s) Signed: 09/26/2017 2:32:27 PM By: Christin Fudge MD, FACS Entered By: Christin Fudge on 09/26/2017 14:32:27 [name], [name] ([id number]) -------------------------------------------------------------------------------- [place] Details Patient Name: [name], [name]. Date of Service: 09/26/2017 1:45 PM Medical Record Number: [id number] Patient Account Number: [id number] Date of Birth/Sex: 07-Feb-1966 (51 y.o. Female) Treating RN: [name] Primary Care Daphnie Venturini: [name] Other Clinician: Referring Lebron Nauert: [name] Treating Abdi Husak/Extender: [name] in Treatment: 53 Active Inactive ` Orientation to the  Wound Care Program Nursing Diagnoses: Knowledge deficit related to the wound healing center program Goals: Patient/caregiver will verbalize understanding of the [place] Date Initiated: 01/23/2017 Target Resolution Date: 05/25/2017 Goal Status: Active Interventions: Provide education on orientation to the wound center Notes: ` Peripheral Neuropathy Nursing Diagnoses: Knowledge deficit related to disease process and management of peripheral neurovascular dysfunction Potential alteration in peripheral tissue perfusion (select prior to confirmation of diagnosis) Goals: Patient/caregiver will verbalize understanding of disease process and disease management Date  Initiated: 01/23/2017 Target Resolution Date: 05/25/2017 Goal Status: Active Interventions: Assess signs and symptoms of neuropathy upon admission and as needed Provide education on Management of Neuropathy and Related Ulcers Provide education on Management of Neuropathy upon discharge from the [place] Treatment Activities: Test ordered outside of clinic : 01/23/2017 Notes: ` Pressure Nursing Diagnoses: Knowledge deficit related to causes and risk factors for pressure ulcer development Knowledge deficit related to management of pressures ulcers [name], [name] ([id number]) Potential for impaired tissue integrity related to pressure, friction, moisture, and shear Goals: Patient will remain free from development of additional pressure ulcers Date Initiated: 01/23/2017 Target Resolution Date: 05/25/2017 Goal Status: Active Patient will remain free of pressure ulcers Date Initiated: 01/23/2017 Target Resolution Date: 05/25/2017 Goal Status: Active Patient/caregiver will verbalize risk factors for pressure ulcer development Date Initiated: 01/23/2017 Target Resolution Date: 05/25/2017 Goal Status: Active Patient/caregiver will verbalize understanding of pressure ulcer management Date Initiated: 01/23/2017 Target Resolution Date: 05/25/2017 Goal Status: Active Interventions: Assess: immobility, friction, shearing, incontinence upon admission and as needed Assess offloading mechanisms upon admission and as needed Assess potential for pressure ulcer upon admission and as needed Provide education on pressure ulcers Treatment Activities: Patient referred for pressure reduction/relief devices : 01/23/2017 Patient referred for seating evaluation to ensure proper offloading : 01/23/2017 Pressure reduction/relief device ordered : 01/23/2017 Notes: ` Wound/Skin Impairment Nursing Diagnoses: Impaired tissue integrity Knowledge deficit related to smoking impact on wound healing Knowledge deficit related to  ulceration/compromised skin integrity Goals: Patient/caregiver will verbalize understanding of skin care regimen Date Initiated: 01/23/2017 Target Resolution Date: 05/25/2017 Goal Status: Active Ulcer/skin breakdown will have a volume reduction of 30% by week 4 Date Initiated: 01/23/2017 Target Resolution Date: 05/25/2017 Goal Status: Active Ulcer/skin breakdown will have a volume reduction of 50% by week 8 Date Initiated: 01/23/2017 Target Resolution Date: 05/25/2017 Goal Status: Active Ulcer/skin breakdown will have a volume reduction of 80% by week 12 Date Initiated: 01/23/2017 Target Resolution Date: 05/25/2017 Goal Status: Active Ulcer/skin breakdown will heal within 14 weeks Date Initiated: 01/23/2017 Target Resolution Date: 05/25/2017 ISIDORA, LAHAM ([id number]) Goal Status: Active Interventions: Assess patient/caregiver ability to obtain necessary supplies Assess patient/caregiver ability to perform ulcer/skin care regimen upon admission and as needed Assess ulceration(s) every visit Provide education on smoking Provide education on ulcer and skin care Treatment Activities: Patient referred to home care : 01/23/2017 Referred to DME Keifer Habib for dressing supplies : 01/23/2017 Skin care regimen initiated : 01/23/2017 Topical wound management initiated : 01/23/2017 Notes: Electronic Signature(s) Signed: 09/26/2017 5:10:23 PM By: [name], [name], RN, CWS, [name], [name] Entered By: [name], [name], RN, CWS, [name] on 09/26/2017 14:21:30 Bost, [name] ([id number]) -------------------------------------------------------------------------------- Pain Assessment Details Patient Name: DOMINIK, LAURICELLA. Date of Service: 09/26/2017 1:45 PM Medical Record Number: [id number] Patient Account Number: [id number] Date of Birth/Sex: [birth year] (51 y.o. Female) Treating RN: [name] Primary Care Marti Acebo: [name] Other Clinician: Referring Duru Reiger: [name] Treating Arlene Genova/Extender: [name] in  Treatment: 35 Active Problems Location of  Pain Severity and Description of Pain Patient Has Paino No Site Locations With Dressing Change: No Pain Management and Medication Current Pain Management: Electronic Signature(s) Signed: 09/26/2017 5:10:23 PM By: [name], [name], RN, CWS, [name], [name] Entered By: [name], [name], RN, CWS, [name] on 09/26/2017 14:06:24 Mau, [name] ([id number]) -------------------------------------------------------------------------------- Patient/Caregiver Education Details Patient Name: ANNALYNN, CENTANNI. Date of Service: 09/26/2017 1:45 PM Medical Record Number: [id number] Patient Account Number: [id number] Date of Birth/Gender: 1966-01-04 (51 y.o. Female) Treating RN: [name] Primary Care Physician: [name] Other Clinician: Referring Physician: Delight [name] Treating Physician/Extender: [name] in Treatment: 2 Education Assessment Education Provided To: Patient Education Topics Provided Wound/Skin Impairment: Handouts: Caring for Your Ulcer, Other: wound care as prescribed Methods: Demonstration, Explain/Verbal Responses: State content correctly Electronic Signature(s) Signed: 09/26/2017 5:10:23 PM By: [name], [name], RN, CWS, [name], [name] Entered By: [name], [name], RN, CWS, [name] on 09/26/2017 14:25:26 Bulger, [name] ([id number]) -------------------------------------------------------------------------------- Wound Assessment Details Patient Name: LANAYSIA, FRITCHMAN. Date of Service: 09/26/2017 1:45 PM Medical Record Number: [id number] Patient Account Number: [id number] Date of Birth/Sex: [birth year] (51 y.o. Female) Treating RN: [name] Primary Care Ousmane Seeman: [name] Other Clinician: Referring Safiyya Stokes: [name] Treating Dmitri Pettigrew/Extender: [name] in Treatment: 35 Wound Status Wound Number: 1 Primary Diabetic Wound/Ulcer of the Lower Extremity Etiology: Wound Location: Right Malleolus - Lateral Wound Open Wounding Event:  Gradually Appeared Status: Date Acquired: 09/23/2016 Comorbid Anemia, Arrhythmia, Coronary Artery Disease, Weeks Of Treatment: 35 History: Hypotension, Peripheral Arterial Disease, Type Clustered Wound: No II Diabetes, Osteoarthritis Photos Wound Measurements Length: (cm) 1 % Reducti Width: (cm) 1.4 % Reducti Depth: (cm) 0.5 Epithelia Area: (cm) 1.1 Tunnelin Volume: (cm) 0.55 Undermin Starti Ending Maximu on in Area: 72% on in Volume: 72% lization: None g: No ing: Yes ng Position (o'clock): 12 Position (o'clock): 12 m Distance: (cm) 0.4 Wound Description Classification: Grade 2 Foul Odor Wound Margin: Distinct, outline attached Slough/Fi Exudate Amount: Large Exudate Type: Serous Exudate Color: amber After Cleansing: No brino Yes Wound Bed Granulation Amount: Medium (34-66%) Exposed Structure Granulation Quality: Pink Fascia Exposed: No Necrotic Amount: Medium (34-66%) Fat Layer (Subcutaneous Tissue) Exposed: Yes Necrotic Quality: Adherent Slough Tendon Exposed: No Muscle Exposed: No Joint Exposed: No Bone Exposed: No Mahany, Swati S. ([id number]) Periwound Skin Texture Texture Color No Abnormalities Noted: No No Abnormalities Noted: No Callus: No Atrophie Blanche: No Crepitus: No Cyanosis: No Excoriation: No Ecchymosis: No Induration: Yes Erythema: Yes Rash: No Erythema Location: Circumferential Scarring: No Hemosiderin Staining: No Mottled: No Moisture Pallor: No No Abnormalities Noted: No Rubor: No Dry / Scaly: No Maceration: Yes Temperature / Pain Temperature: No Abnormality Tenderness on Palpation: Yes Wound Preparation Ulcer Cleansing: Rinsed/Irrigated with Saline Topical Anesthetic Applied: Other: lidocaine 4%, Treatment Notes Wound #1 (Right, Lateral Malleolus) 1. Cleansed with: Clean wound with Normal Saline 2. Anesthetic Topical Lidocaine 4% cream to wound bed prior to debridement 3. Peri-wound Care: Skin Prep 4. Dressing  Applied: Other dressing (specify in notes) 5. Secondary Dressing Applied Bordered Foam Dressing Dry Gauze Notes [facility] Electronic Signature(s) Signed: 09/26/2017 5:10:23 PM By: [name], [name], RN, CWS, [name], [name] Entered By: [name], [name], RN, CWS, [name] on 09/26/2017 14:13:59 Zegarra, [name] ([id number]) -------------------------------------------------------------------------------- Wound Assessment Details Patient Name: TAIWAN, MILLON. Date of Service: 09/26/2017 1:45 PM Medical Record Number: [id number] Patient Account Number: [id number] Date of Birth/Sex: 1966-02-01 (51 y.o. Female) Treating RN: [name] Primary Care Torez Beauregard: [name] Other Clinician: Referring Joeanthony Seeling: [name] Treating Shuntel Fishburn/Extender: [name] in  Treatment: 35 Wound Status Wound Number: 2 Primary Open Surgical Wound Etiology: Wound Location: Right Gluteus Wound Open Wounding Event: Gradually Appeared Status: Date Acquired: 09/18/2016 Comorbid Anemia, Arrhythmia, Coronary Artery Disease, Weeks Of Treatment: 35 History: Hypotension, Peripheral Arterial Disease, Type Clustered Wound: No II Diabetes, Osteoarthritis Photos Wound Measurements Length: (cm) 2 Width: (cm) 1 Depth: (cm) 1.2 Area: (cm) 1.571 Volume: (cm) 1.885 % Reduction in Area: 91.8% % Reduction in Volume: 98% Epithelialization: None Tunneling: No Undermining: No Wound Description Full Thickness Without Exposed Support Foul Odo Classification: Structures Due to P Wound Margin: Epibole Slough/F Exudate Large Amount: Exudate Type: Serous Exudate Color: amber r After Cleansing: Yes roduct Use: No ibrino Yes Wound Bed Granulation Amount: Medium (34-66%) Exposed Structure Granulation Quality: Red, Pink Fascia Exposed: No Necrotic Amount: Medium (34-66%) Fat Layer (Subcutaneous Tissue) Exposed: Yes Necrotic Quality: Adherent Slough Tendon Exposed: No Muscle Exposed: No Joint Exposed: No Bone  Exposed: No Periwound Skin Texture Texture Color Jacot, Leani S. ([id number]) No Abnormalities Noted: No No Abnormalities Noted: No Scarring: Yes Ecchymosis: Yes Moisture Temperature / Pain No Abnormalities Noted: No Temperature: No Abnormality Maceration: Yes Tenderness on Palpation: Yes Wound Preparation Ulcer Cleansing: Rinsed/Irrigated with Saline Topical Anesthetic Applied: Other: lidocaine 4%, Treatment Notes Wound #2 (Right Gluteus) 1. Cleansed with: Clean wound with Normal Saline 2. Anesthetic Topical Lidocaine 4% cream to wound bed prior to debridement 3. Peri-wound Care: Skin Prep 4. Dressing Applied: Other dressing (specify in notes) 5. Secondary Dressing Applied Bordered Foam Dressing Dry Gauze Notes [facility] Electronic Signature(s) Signed: 09/26/2017 5:10:23 PM By: [name], [name], RN, CWS, [name], [name] Entered By: [name], [name], RN, CWS, [name] on 09/26/2017 14:14:43 Sainato, [name] ([id number]) -------------------------------------------------------------------------------- Vitals Details Patient Name: RAMANI, RIVA. Date of Service: 09/26/2017 1:45 PM Medical Record Number: [id number] Patient Account Number: [id number] Date of Birth/Sex: 1966-04-03 (51 y.o. Female) Treating RN: [name] Primary Care Tammi Boulier: [name] Other Clinician: Referring Glendale Youngblood: [name] Treating Derrell Milanes/Extender: [name] in Treatment: 35 Vital Signs Time Taken: 14:06 Temperature (F): 98.1 Height (in): 67 Pulse (bpm): 60 Weight (lbs): 137 Respiratory Rate (breaths/min): 16 Body Mass Index (BMI): 21.5 Blood Pressure (mmHg): 115/52 Reference Range: 80 - 120 mg / dl Electronic Signature(s) Signed: 09/26/2017 5:10:23 PM By: [name], [name], RN, CWS, [name], [name] Entered By: [name], [name], RN, CWS, [name] on 09/26/2017 14:07:16

## 2017-10-06 ENCOUNTER — Ambulatory Visit (INDEPENDENT_AMBULATORY_CARE_PROVIDER_SITE_OTHER): Payer: Medicare Other | Admitting: Surgery

## 2017-10-06 ENCOUNTER — Ambulatory Visit (HOSPITAL_COMMUNITY)
Admission: RE | Admit: 2017-10-06 | Discharge: 2017-10-06 | Disposition: A | Discharge status: Home / Self Care | Payer: Medicare Other | Source: Ambulatory Visit | Attending: Surgery | Admitting: Surgery

## 2017-10-06 ENCOUNTER — Encounter: Payer: Self-pay | Admitting: *Deleted

## 2017-10-06 ENCOUNTER — Other Ambulatory Visit: Payer: Self-pay | Admitting: *Deleted

## 2017-10-06 ENCOUNTER — Encounter: Payer: Self-pay | Admitting: Surgery

## 2017-10-06 VITALS — BP 134/76 | HR 89 | Temp 97.2°F | Resp 16 | Ht 67.0 in | Wt 160.0 lb

## 2017-10-06 DIAGNOSIS — I6529 Occlusion and stenosis of unspecified carotid artery: Secondary | ICD-10-CM | POA: Diagnosis present

## 2017-10-06 DIAGNOSIS — I6523 Occlusion and stenosis of bilateral carotid arteries: Secondary | ICD-10-CM | POA: Insufficient documentation

## 2017-10-06 DIAGNOSIS — I7025 Atherosclerosis of native arteries of other extremities with ulceration: Secondary | ICD-10-CM | POA: Diagnosis not present

## 2017-10-06 LAB — VAS US CAROTID
LCCADDIAS: -46 cm/s
LCCADSYS: -164 cm/s
LCCAPSYS: 122 cm/s
LEFT ECA DIAS: -65 cm/s
LICADSYS: -128 cm/s
LICAPSYS: 171 cm/s
Left CCA prox dias: 17 cm/s
Left ICA dist dias: -43 cm/s
Left ICA prox dias: 49 cm/s
RCCAPSYS: 122 cm/s
RIGHT CCA MID DIAS: 20 cm/s
RIGHT ECA DIAS: -19 cm/s
Right CCA prox dias: 10 cm/s
Right cca dist sys: -85 cm/s

## 2017-10-06 NOTE — H&P (View-Only) (Signed)
[facility] [place]  Patient name: [name] MRN: [id number] DOB: [birth year] Sex: female   REASON FOR VISIT:    Follow up  HISOTRY OF PRESENT ILLNESS:    [name] Milesis a 51 y.o.femalewho presents for follow-up.  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 She has had multiple interventions for recurrent stenosis. She has also undergone balloon angioplasty of her right subclavian artery for upper extremity numbness. 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 She has known osteomyelitis. She is on prolonged antibiotics and has a wound VAC in place. While in the hospital in February 2018 she had a wound on her right foot. 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 She is not a candidate for percutaneous intervention. She needs surgical revascularization but because of her infectious issues this has been delayed.  She is back today for follow-up with no significant interval changes.  PAST MEDICAL HISTORY:   Past Medical History:  Diagnosis Date  . Arthritis   . Asthma   . Chronic back pain   . Depression   . Diabetes mellitus   . Diabetic neuropathy ([place])   . Family history of adverse reaction to anesthesia    mother had difficlty waking   . GERD (gastroesophageal reflux disease)   . Hyperlipidemia   . Joint pain   . Leg pain    With Walking  . Osteomyelitis of right fibula ([place]) 03/05/2017  . PAD (peripheral artery disease) ([place])   . Reflux   . Ulcer    Foot     FAMILY HISTORY:   Family History  Problem Relation Age of Onset  . Coronary artery disease Mother   . Peripheral vascular disease Mother   . Heart disease Mother        Before age 57  . Other Mother        Venous insuffiency  . Diabetes Mother   . Hyperlipidemia Mother   . Hypertension Mother   . Varicose Veins Mother   . Heart  attack Mother        before age 48  . Heart disease Father   . Diabetes Father   . Diabetes Maternal Grandmother   . Diabetes Paternal Grandmother   . Diabetes Paternal Grandfather   . Diabetes Sister   . Hypertension Sister   . Diabetes Brother   . Hypertension Brother     SOCIAL HISTORY:   Social History   Tobacco Use  . Smoking status: Former Smoker    Years: 30.00    Types: Cigarettes    Last attempt to quit: 02/07/2017    Years since quitting: 0.6  . Smokeless tobacco: Never Used  Substance Use Topics  . Alcohol use: No    Alcohol/week: 0.0 oz     ALLERGIES:   No Known Allergies   CURRENT MEDICATIONS:   Current Outpatient Medications  Medication Sig Dispense Refill  . albuterol (PROVENTIL) 2 MG tablet Take 2 mg by mouth 3 (three) times daily as needed for shortness of breath.     Marland Kitchen aspirin 81 MG tablet Take 81 mg by mouth daily.     Marland Kitchen atorvastatin (LIPITOR) 40 MG tablet Take 40 mg by mouth daily.    . cetirizine (ZYRTEC) 10 MG tablet Take 10 mg by mouth daily as needed for allergies. Reported on 03/27/2016    . cyclobenzaprine (FLEXERIL) 10 MG tablet Limit 1 tablet by  mouth 1  to  3 times per day if tolerated (Patient taking differently: Take 10 mg by mouth 3 (three) times daily as needed for muscle spasms. ) 90 tablet 0  . doxycycline (VIBRA-TABS) 100 MG tablet Take 1 tablet (100 mg total) by mouth 2 (two) times daily. 60 tablet 8  . furosemide (LASIX) 40 MG tablet Take 40 mg by mouth 2 (two) times daily as needed for fluid.     Marland Kitchen HUMALOG KWIKPEN 100 UNIT/ML KiwkPen Inject 10 Units into the skin 3 (three) times daily. Per sliding scale    . HYDROcodone-acetaminophen (NORCO) 10-325 MG tablet Limit 1 tablet by mouth per day or twice per day if tolerated (Patient taking differently: Take 1 tablet by mouth See admin instructions. Every 8-12 hours) 60 tablet 0  . LANTUS SOLOSTAR 100 UNIT/ML Solostar Pen Inject 36 Units into the skin 2 (two) times daily.     Marland Kitchen LORazepam  (ATIVAN) 0.5 MG tablet Take 1 tablet 1 hour prior to PICC insertion.  May take 2nd pill at hospital if needed. 2 tablet 0  . meclizine (ANTIVERT) 25 MG tablet Take 25 mg by mouth 3 (three) times daily as needed for dizziness.    . midodrine (PROAMATINE) 10 MG tablet Take 0.5 tablets (5 mg total) by mouth 3 (three) times daily as needed (lightheadedness and/or low blood pressure). 90 tablet 0  . omeprazole (PRILOSEC) 40 MG capsule Take 40 mg by mouth daily.    . ondansetron (ZOFRAN ODT) 4 MG disintegrating tablet Take 1 tablet (4 mg total) by mouth every 8 (eight) hours as needed for nausea or vomiting. 20 tablet 0  . pregabalin (LYRICA) 100 MG capsule Limit 1 tab by mouth twice a day to 3 times a day if tolerated (Patient taking differently: Take 100 mg by mouth 3 (three) times daily. ) 90 capsule 0  . pseudoephedrine-guaifenesin (MUCINEX D) 60-600 MG per tablet Take 1 tablet by mouth every 12 (twelve) hours as needed for congestion. Reported on 04/08/2016    . dicyclomine (BENTYL) 10 MG capsule Take 10 mg by mouth 4 (four) times daily as needed for spasms.     No current facility-administered medications for this visit.     REVIEW OF SYSTEMS:   [X]  denotes positive finding, [ ]  denotes negative finding Cardiac  Comments:  Chest pain or chest pressure:    Shortness of breath upon exertion:    Short of breath when lying flat:    Irregular heart rhythm:        Vascular    Pain in calf, thigh, or hip brought on by ambulation:    Pain in feet at night that wakes you up from your sleep:     Blood clot in your veins:    Leg swelling:         Pulmonary    Oxygen at home:    Productive cough:     Wheezing:         Neurologic    Sudden weakness in arms or legs:     Sudden numbness in arms or legs:     Sudden onset of difficulty speaking or slurred speech:    Temporary loss of vision in one eye:     Problems with dizziness:         Gastrointestinal    Blood in stool:     Vomited blood:          Genitourinary    Burning when urinating:  Blood in urine:        Psychiatric    Major depression:         Hematologic    Bleeding problems:    Problems with blood clotting too easily:        Skin    Rashes or ulcers:        Constitutional    Fever or chills:      PHYSICAL EXAM:   There were no vitals filed for this visit.  GENERAL: The patient is a well-nourished female, in no acute distress. The vital signs are documented above. CARDIAC: There is a regular rate and rhythm.  PULMONARY: Non-labored respirations ABDOMEN: Soft and non-tender with normal pitched bowel sounds.  MUSCULOSKELETAL: There are no major deformities or cyanosis. NEUROLOGIC: No focal weakness or paresthesias are detected. SKIN: Wounds not examined today PSYCHIATRIC: The patient has a normal affect.  STUDIES:   Carotid Dopplers show 40-59% carotid stenosis vertebral blood flow is antegrade  MEDICAL ISSUES:   Nonhealing right leg wound: This has been a very complicated situation because of her necrotizing fasciitis on her sacrum.  I have felt that the best option would be to proceed with an aortobifemoral bypass graft, however I do not want to do this in the setting of infection.  The patient has been unable to heal her sacral wound, however she has been on suppressive antibiotics and is not having issues with fever.  She also has a nonhealing wound on her right ankle that likely will not improve until her circulation has been addressed.  Her last angiogram in February showed patent bilateral femoral-popliteal bypass grafts, however her iliac artery had occluded.  She has not been a great candidate for an axillary femoral bypass graft because she has had subclavian artery stenosis treated in the past.  At this time I feel that something needs to be done.  I proposed proceeding with angiography via left femoral approach to evaluate her left subclavian artery and address any stenosis at that time.   In addition I will performed bilateral runoff to confirm that her right femoral-popliteal as well as her left femoral-popliteal bypass grafts remain patent.  Based on these results I am considering a right axillary to femoral bypass graft for limb salvage.  I am doing arteriogram on Tuesday, November 27.    [name], MD [facility] [phone] Pager [phone]

## 2017-10-06 NOTE — Progress Notes (Signed)
[facility] [place]  Patient name: [name] MRN: [id number] DOB: 1966-09-28 Sex: female   REASON FOR VISIT:    Follow up  HISOTRY OF PRESENT ILLNESS:    [name] Milesis a 51 y.o.femalewho presents for follow-up.  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 She has had multiple interventions for recurrent stenosis. She has also undergone balloon angioplasty of her right subclavian artery for upper extremity numbness. 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 She has known osteomyelitis. She is on prolonged antibiotics and has a wound VAC in place. While in the hospital in February 2018 she had a wound on her right foot. 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 She is not a candidate for percutaneous intervention. She needs surgical revascularization but because of her infectious issues this has been delayed.  She is back today for follow-up with no significant interval changes.  PAST MEDICAL HISTORY:   Past Medical History:  Diagnosis Date  . Arthritis   . Asthma   . Chronic back pain   . Depression   . Diabetes mellitus   . Diabetic neuropathy ([place])   . Family history of adverse reaction to anesthesia    mother had difficlty waking   . GERD (gastroesophageal reflux disease)   . Hyperlipidemia   . Joint pain   . Leg pain    With Walking  . Osteomyelitis of right fibula ([place]) 03/05/2017  . PAD (peripheral artery disease) ([place])   . Reflux   . Ulcer    Foot     FAMILY HISTORY:   Family History  Problem Relation Age of Onset  . Coronary artery disease Mother   . Peripheral vascular disease Mother   . Heart disease Mother        Before age 41  . Other Mother        Venous insuffiency  . Diabetes Mother   . Hyperlipidemia Mother   . Hypertension Mother   . Varicose Veins Mother   . Heart  attack Mother        before age 9  . Heart disease Father   . Diabetes Father   . Diabetes Maternal Grandmother   . Diabetes Paternal Grandmother   . Diabetes Paternal Grandfather   . Diabetes Sister   . Hypertension Sister   . Diabetes Brother   . Hypertension Brother     SOCIAL HISTORY:   Social History   Tobacco Use  . Smoking status: Former Smoker    Years: 30.00    Types: Cigarettes    Last attempt to quit: 02/07/2017    Years since quitting: 0.6  . Smokeless tobacco: Never Used  Substance Use Topics  . Alcohol use: No    Alcohol/week: 0.0 oz     ALLERGIES:   No Known Allergies   CURRENT MEDICATIONS:   Current Outpatient Medications  Medication Sig Dispense Refill  . albuterol (PROVENTIL) 2 MG tablet Take 2 mg by mouth 3 (three) times daily as needed for shortness of breath.     Marland Kitchen aspirin 81 MG tablet Take 81 mg by mouth daily.     Marland Kitchen atorvastatin (LIPITOR) 40 MG tablet Take 40 mg by mouth daily.    . cetirizine (ZYRTEC) 10 MG tablet Take 10 mg by mouth daily as needed for allergies. Reported on 03/27/2016    . cyclobenzaprine (FLEXERIL) 10 MG tablet Limit 1 tablet by  mouth 1  to  3 times per day if tolerated (Patient taking differently: Take 10 mg by mouth 3 (three) times daily as needed for muscle spasms. ) 90 tablet 0  . doxycycline (VIBRA-TABS) 100 MG tablet Take 1 tablet (100 mg total) by mouth 2 (two) times daily. 60 tablet 8  . furosemide (LASIX) 40 MG tablet Take 40 mg by mouth 2 (two) times daily as needed for fluid.     Marland Kitchen HUMALOG KWIKPEN 100 UNIT/ML KiwkPen Inject 10 Units into the skin 3 (three) times daily. Per sliding scale    . HYDROcodone-acetaminophen (NORCO) 10-325 MG tablet Limit 1 tablet by mouth per day or twice per day if tolerated (Patient taking differently: Take 1 tablet by mouth See admin instructions. Every 8-12 hours) 60 tablet 0  . LANTUS SOLOSTAR 100 UNIT/ML Solostar Pen Inject 36 Units into the skin 2 (two) times daily.     Marland Kitchen LORazepam  (ATIVAN) 0.5 MG tablet Take 1 tablet 1 hour prior to PICC insertion.  May take 2nd pill at hospital if needed. 2 tablet 0  . meclizine (ANTIVERT) 25 MG tablet Take 25 mg by mouth 3 (three) times daily as needed for dizziness.    . midodrine (PROAMATINE) 10 MG tablet Take 0.5 tablets (5 mg total) by mouth 3 (three) times daily as needed (lightheadedness and/or low blood pressure). 90 tablet 0  . omeprazole (PRILOSEC) 40 MG capsule Take 40 mg by mouth daily.    . ondansetron (ZOFRAN ODT) 4 MG disintegrating tablet Take 1 tablet (4 mg total) by mouth every 8 (eight) hours as needed for nausea or vomiting. 20 tablet 0  . pregabalin (LYRICA) 100 MG capsule Limit 1 tab by mouth twice a day to 3 times a day if tolerated (Patient taking differently: Take 100 mg by mouth 3 (three) times daily. ) 90 capsule 0  . pseudoephedrine-guaifenesin (MUCINEX D) 60-600 MG per tablet Take 1 tablet by mouth every 12 (twelve) hours as needed for congestion. Reported on 04/08/2016    . dicyclomine (BENTYL) 10 MG capsule Take 10 mg by mouth 4 (four) times daily as needed for spasms.     No current facility-administered medications for this visit.     REVIEW OF SYSTEMS:   [X]  denotes positive finding, [ ]  denotes negative finding Cardiac  Comments:  Chest pain or chest pressure:    Shortness of breath upon exertion:    Short of breath when lying flat:    Irregular heart rhythm:        Vascular    Pain in calf, thigh, or hip brought on by ambulation:    Pain in feet at night that wakes you up from your sleep:     Blood clot in your veins:    Leg swelling:         Pulmonary    Oxygen at home:    Productive cough:     Wheezing:         Neurologic    Sudden weakness in arms or legs:     Sudden numbness in arms or legs:     Sudden onset of difficulty speaking or slurred speech:    Temporary loss of vision in one eye:     Problems with dizziness:         Gastrointestinal    Blood in stool:     Vomited blood:          Genitourinary    Burning when urinating:  Blood in urine:        Psychiatric    Major depression:         Hematologic    Bleeding problems:    Problems with blood clotting too easily:        Skin    Rashes or ulcers:        Constitutional    Fever or chills:      PHYSICAL EXAM:   There were no vitals filed for this visit.  GENERAL: The patient is a well-nourished female, in no acute distress. The vital signs are documented above. CARDIAC: There is a regular rate and rhythm.  PULMONARY: Non-labored respirations ABDOMEN: Soft and non-tender with normal pitched bowel sounds.  MUSCULOSKELETAL: There are no major deformities or cyanosis. NEUROLOGIC: No focal weakness or paresthesias are detected. SKIN: Wounds not examined today PSYCHIATRIC: The patient has a normal affect.  STUDIES:   Carotid Dopplers show 40-59% carotid stenosis vertebral blood flow is antegrade  MEDICAL ISSUES:   Nonhealing right leg wound: This has been a very complicated situation because of her necrotizing fasciitis on her sacrum.  I have felt that the best option would be to proceed with an aortobifemoral bypass graft, however I do not want to do this in the setting of infection.  The patient has been unable to heal her sacral wound, however she has been on suppressive antibiotics and is not having issues with fever.  She also has a nonhealing wound on her right ankle that likely will not improve until her circulation has been addressed.  Her last angiogram in February showed patent bilateral femoral-popliteal bypass grafts, however her iliac artery had occluded.  She has not been a great candidate for an axillary femoral bypass graft because she has had subclavian artery stenosis treated in the past.  At this time I feel that something needs to be done.  I proposed proceeding with angiography via left femoral approach to evaluate her left subclavian artery and address any stenosis at that time.   In addition I will performed bilateral runoff to confirm that her right femoral-popliteal as well as her left femoral-popliteal bypass grafts remain patent.  Based on these results I am considering a right axillary to femoral bypass graft for limb salvage.  I am doing arteriogram on Tuesday, November 27.    [name], MD [facility] [phone] Pager [phone]

## 2017-10-07 ENCOUNTER — Ambulatory Visit (INDEPENDENT_AMBULATORY_CARE_PROVIDER_SITE_OTHER): Payer: Medicare Other | Admitting: Orthopedic Surgery

## 2017-10-07 ENCOUNTER — Encounter (INDEPENDENT_AMBULATORY_CARE_PROVIDER_SITE_OTHER): Payer: Self-pay | Admitting: Orthopedic Surgery

## 2017-10-07 DIAGNOSIS — I70233 Atherosclerosis of native arteries of right leg with ulceration of ankle: Secondary | ICD-10-CM

## 2017-10-07 NOTE — Progress Notes (Signed)
Office Visit Note   Patient: [name]           Date of Birth: Feb 06, 1966           MRN: [id number] Visit Date: 10/07/2017              Requested by: [name], [address] [place], [place] [address] PCP: [name], MD  Chief Complaint  Patient presents with  . Right Ankle - Follow-up      HPI: Patient is a 51 year old woman who presents in follow-up for a chronic ulcer lateral malleolus right ankle.  Previous plain films show chronic changes of the bone consistent with osteomyelitis.  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  Assessment & Plan: Visit Diagnoses:  1. 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 Trula Slade for evaluation of her arterial inflow possible revascularization candidate continue wound care as instructed.  Follow-Up Instructions: Return in about 4 weeks (around 11/04/2017).   Ortho Exam  Patient is alert, oriented, no adenopathy, well-dressed, normal affect, normal respiratory effort. Examination patient's right lower extremity is thin and atrophic.  No venous stasis swelling.  She has a thready palpable dorsalis pedis pulse.  Lateral malleolus has a chronic ulcer which is 10 mm in diameter 5 mm deep.  There is no ascending cellulitis no purulent drainage no exposed bone or tendon.  Imaging: No results found. No images are attached to the encounter.  Labs: Lab Results  Component Value Date   HGBA1C 10.8 (H) 12/31/2016   HGBA1C 11.2 (H) 12/24/2016   HGBA1C 13.1 (H) 06/27/2011   ESRSEDRATE 46 (H) 08/12/2017   ESRSEDRATE 13 05/12/2017   ESRSEDRATE 76 (H) 04/30/2017   CRP 11.5 (H) 08/12/2017   CRP 2.1 05/12/2017   CRP 2.8 (H)  04/30/2017   REPTSTATUS 12/25/2016 FINAL 12/23/2016   GRAMSTAIN  09/30/2016    ABUNDANT WBC PRESENT,BOTH PMN AND MONONUCLEAR ABUNDANT GRAM NEGATIVE RODS ABUNDANT GRAM POSITIVE COCCI IN PAIRS IN CLUSTERS FEW GRAM POSITIVE RODS    CULT MULTIPLE SPECIES PRESENT, SUGGEST RECOLLECTION (A) 12/23/2016    Orders:  No orders of the defined types were placed in this encounter.  No orders of the defined types were placed in this encounter.    Procedures: No procedures performed  Clinical Data: No additional findings.  ROS:  All other systems negative, except as noted in the HPI. Review of Systems  Objective: Vital Signs: There were no vitals taken for this visit.  Specialty Comments:  No specialty comments available.  PMFS History: Patient Active Problem List   Diagnosis Date Noted  . Hyperkalemia 06/26/2017  . 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 Osteomyelitis of right fibula ([place]) 03/05/2017  . Non-pressure chronic ulcer of right ankle limited to breakdown of skin ([place]) 01/17/2017  . Elevated lactic acid level   . Osteomyelitis of ankle ([place]) 12/29/2016  . Chronic pain 12/24/2016  . Buttock wound, left, subsequent encounter   . Hypotension 12/23/2016  . IDDM (insulin dependent diabetes mellitus) ([place]) 12/23/2016  . AKI (acute kidney injury) ([place]) 12/23/2016  . Malnutrition ([place]) 12/23/2016  . Thrush 12/23/2016  . Sepsis ([place]) 11/27/2016  .  DM2 (diabetes mellitus, type 2) ([place]) 11/27/2016  . Chronic back pain 11/27/2016  . Orthostatic hypotension 11/27/2016  . Demand ischemia ([place])   . Other hyperlipidemia   . Right sided weakness   . Retinal tumor of right eye   . Pressure injury of skin 10/01/2016  . Necrotizing fasciitis ([place]) 09/30/2016  . 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 Paresthesia of both hands 03/05/2016  . DDD (degenerative disc disease), lumbosacral 03/26/2015  . DDD  (degenerative disc disease), cervical 03/26/2015  . Sacroiliac joint disease 03/26/2015  . Occipital neuralgia 03/26/2015  . Pain in limb-Bilateral Leg 04/25/2014  . PAD (peripheral artery disease) ([place]) 04/05/2013  . Chronic total occlusion of artery of the extremities ([place]) 02/10/2012   Past Medical History:  Diagnosis Date  . Arthritis   . Asthma   . Chronic back pain   . Depression   . Diabetes mellitus   . Diabetic neuropathy ([place])   . Family history of adverse reaction to anesthesia    mother had difficlty waking   . GERD (gastroesophageal reflux disease)   . Hyperlipidemia   . Joint pain   . Leg pain    With Walking  . Osteomyelitis of right fibula ([place]) 03/05/2017  . PAD (peripheral artery disease) ([place])   . Reflux   . Ulcer    Foot    Family History  Problem Relation Age of Onset  . Coronary artery disease Mother   . Peripheral vascular disease Mother   . Heart disease Mother        Before age 33  . Other Mother        Venous insuffiency  . Diabetes Mother   . Hyperlipidemia Mother   . Hypertension Mother   . Varicose Veins Mother   . Heart attack Mother        before age 76  . Heart disease Father   . Diabetes Father   . Diabetes Maternal Grandmother   . Diabetes Paternal Grandmother   . Diabetes Paternal Grandfather   . Diabetes Sister   . Hypertension Sister   . Diabetes Brother   . Hypertension Brother     Past Surgical History:  Procedure Laterality Date  . ABDOMINAL AORTAGRAM  June 15, 2014  . ABDOMINAL AORTAGRAM N/A 06/15/2014   Procedure: ABDOMINAL Maxcine Ham;  Surgeon: Serafina Mitchell, MD;  Location: [facility] CATH LAB;  Service: Cardiovascular;  Laterality: N/A;  . ABDOMINAL AORTAGRAM N/A 11/22/2014   Procedure: ABDOMINAL AORTAGRAM;  Surgeon: Serafina Mitchell, MD;  Location: [facility] CATH LAB;  Service: Cardiovascular;  Laterality: N/A;  . ABDOMINAL AORTOGRAM W/LOWER EXTREMITY N/A 01/07/2017   Procedure: Abdominal Aortogram w/Lower Extremity;  Surgeon: Serafina Mitchell, MD;  Location: [facility] CV LAB;  Service: Cardiovascular;  Laterality: N/A;  . ARTERIAL BYPASS SURGRY  07/05/2010   Right Common Femoral to below knee popliteal BPG  . BACK SURGERY     X's  2  . CARDIAC CATHETERIZATION    . CHOLECYSTECTOMY     Gall Bladder  . CYSTECTOMY Right    foot  . CYSTECTOMY Left    wrist  . INTERCOSTAL NERVE BLOCK  November 2015  . IR FLUORO GUIDE CV LINE RIGHT  03/20/2017  . IR US GUIDE VASC ACCESS RIGHT  03/20/2017  . IRRIGATION AND DEBRIDEMENT BUTTOCKS Right 09/30/2016   Procedure: DEBRIDEMENT RIGHT  BUTTOCK WOUND;  Surgeon: Georganna Skeans, MD;  Location: [facility];  Service: General;  Laterality: Right;  . left foot surgery    .  left wrist cyst removal Left   . PERIPHERAL VASCULAR CATHETERIZATION N/A 05/07/2016   Procedure: Abdominal Aortogram;  Surgeon: Serafina Mitchell, MD;  Location: [facility] CV LAB;  Service: Cardiovascular;  Laterality: N/A;  . PERIPHERAL VASCULAR CATHETERIZATION N/A 05/07/2016   Procedure: Lower Extremity Angiography;  Surgeon: Serafina Mitchell, MD;  Location: [facility] CV LAB;  Service: Cardiovascular;  Laterality: N/A;  . PERIPHERAL VASCULAR CATHETERIZATION N/A 05/07/2016   Procedure: Aortic Arch Angiography;  Surgeon: Serafina Mitchell, MD;  Location: [facility] CV LAB;  Service: Cardiovascular;  Laterality: N/A;  . PERIPHERAL VASCULAR CATHETERIZATION N/A 05/07/2016   Procedure: Upper Extremity Angiography;  Surgeon: Serafina Mitchell, MD;  Location: [facility] CV LAB;  Service: Cardiovascular;  Laterality: N/A;  . PERIPHERAL VASCULAR CATHETERIZATION Right 05/07/2016   Procedure: Peripheral Vascular Balloon Angioplasty;  Surgeon: Serafina Mitchell, MD;  Location: [facility] CV LAB;  Service: Cardiovascular;  Laterality: Right;  subclavian  . PERIPHERAL VASCULAR CATHETERIZATION Right 05/07/2016   Procedure: Peripheral Vascular Intervention;  Surgeon: Serafina Mitchell, MD;  Location: [facility] CV LAB;  Service: Cardiovascular;   Laterality: Right;  External  Iliac  . SKIN GRAFT Right 2012   [name] by Dr. Nils Pyle- Right and Left Ankle  . SPINE SURGERY    . TONSILLECTOMY     Social History   Occupational History  . Occupation: disabled  Tobacco Use  . Smoking status: Former Smoker    Years: 30.00    Types: Cigarettes    Last attempt to quit: 02/07/2017    Years since quitting: 0.6  . Smokeless tobacco: Never Used  Substance and Sexual Activity  . Alcohol use: No    Alcohol/week: 0.0 oz  . Drug use: No  . Sexual activity: Yes    Birth control/protection: Post-menopausal

## 2017-10-14 ENCOUNTER — Ambulatory Visit (HOSPITAL_COMMUNITY): Admission: RE | Admit: 2017-10-14 | Payer: Medicare Other | Source: Ambulatory Visit | Admitting: Surgery

## 2017-10-14 ENCOUNTER — Other Ambulatory Visit: Payer: Self-pay | Admitting: *Deleted

## 2017-10-14 ENCOUNTER — Telehealth: Payer: Self-pay | Admitting: *Deleted

## 2017-10-14 ENCOUNTER — Encounter (HOSPITAL_COMMUNITY): Admission: RE | Payer: Self-pay | Source: Ambulatory Visit

## 2017-10-14 SURGERY — LOWER EXTREMITY ANGIOGRAPHY
Anesthesia: LOCAL

## 2017-10-14 NOTE — Telephone Encounter (Signed)
Call to patient to reschedule Aortogram. Patient stated that she had been sick all night and will need to have spouse with her to schedule time of procedure. Instructed her to call me this seek to do so.

## 2017-10-14 NOTE — Progress Notes (Signed)
Procedure rescheduled for 10/28/17 to be at admitting at [facility] at 5:30 am. Reviewed pre-procedure instructions as discussed for 10/14/17 procedure that was canceled due to patient's illness. Verbalized understanding.

## 2017-10-24 ENCOUNTER — Encounter: Payer: Medicare Other | Attending: Surgery | Admitting: Surgery

## 2017-10-24 DIAGNOSIS — I70233 Atherosclerosis of native arteries of right leg with ulceration of ankle: Secondary | ICD-10-CM | POA: Diagnosis not present

## 2017-10-24 DIAGNOSIS — M86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, right ankle and foot: Secondary | ICD-10-CM | POA: Diagnosis not present

## 2017-10-24 DIAGNOSIS — L89314 Pressure ulcer of right buttock, stage 4: Secondary | ICD-10-CM | POA: Diagnosis not present

## 2017-10-24 DIAGNOSIS — F17218 Nicotine dependence, cigarettes, with other nicotine-induced disorders: Secondary | ICD-10-CM | POA: Diagnosis not present

## 2017-10-24 DIAGNOSIS — L97312 Non-pressure chronic ulcer of right ankle with fat layer exposed: Secondary | ICD-10-CM | POA: Insufficient documentation

## 2017-10-24 DIAGNOSIS — E11621 Type 2 diabetes mellitus with foot ulcer: Secondary | ICD-10-CM | POA: Insufficient documentation

## 2017-10-28 ENCOUNTER — Telehealth: Payer: Self-pay | Admitting: *Deleted

## 2017-10-28 ENCOUNTER — Encounter (HOSPITAL_COMMUNITY): Admission: RE | Payer: Self-pay | Source: Ambulatory Visit

## 2017-10-28 ENCOUNTER — Ambulatory Visit (HOSPITAL_COMMUNITY): Admission: RE | Admit: 2017-10-28 | Payer: Medicare Other | Source: Ambulatory Visit | Admitting: Surgery

## 2017-10-28 ENCOUNTER — Other Ambulatory Visit: Payer: Self-pay | Admitting: *Deleted

## 2017-10-28 SURGERY — LOWER EXTREMITY ANGIOGRAPHY
Anesthesia: LOCAL

## 2017-10-28 NOTE — Telephone Encounter (Signed)
Called and rescheduled procedure in [facility] lab for 10/31/17. To be at [facility] admitting department at 5:30 am. Reviewed all pre aortogram instructions with patient and her husband. I/2 dose HS insulin, bedtime snack NPO past MN except heart and b/p meds with sips of water, hold insulin, diabetes meds and all other meds till after this test. Verbalized understanding.

## 2017-10-29 NOTE — Progress Notes (Signed)
Jamie, [name] ([id number]) Visit Report for 10/24/2017 HPI Details Patient Name: [name], [name]. Date of Service: 10/24/2017 1:45 PM Medical Record Number: [id number] Patient Account Number: [id number] Date of Birth/Sex: October 18, 1966 (51 y.o. Female) Treating RN: Roger Shelter Primary Care Provider: Delight Stare Other Clinician: Referring Provider: Delight Stare Treating Provider/Extender: Frann Rider in Treatment: 39 History of Present Illness Location: right lateral ankle and right gluteal area Quality: Patient reports experiencing a sharp pain to affected area(s). Severity: Patient states wound are getting better slowly Duration: Patient has had the wound for > 4 months prior to seeking treatment at the wound center Timing: Pain in wound is constant (hurts all the time) Context: The wound occurred when the patient was in hospital with a necrotizing fasciitis of the right gluteal area and a ulceration on the right ankle Modifying Factors: Other treatment(s) tried include:admitted to the hospital for IV antibiotics and a full workup and has also had a recent angiogram Associated Signs and Symptoms: Patient reports having increase discharge. HPI Description: 51 year old patient was sent to [place] from [facility] where she was seen by Dr. Sherril Cong for a left ankle ulceration and was recently hospitalized with hypotension and sepsis. She was treated with IV antibiotics and has been scheduled to see Dr. Sharol Given. She was seen by vascular surgery who recommended a femoral bypass but surgery has been delayed until her sacral wound from last year's necrotizing fasciitis has healed. Was seeing the wound care team at [facility] but wanted to change over. She is a smoker and smokes a pack of cigarettes a day The patient was recently admitted in [place] between February 2 and February 14. She had a follow-up to see vascular surgery, orthopedic surgery and infectious disease. During her  admission she was known to have peripheral arterial disease, diabetes mellitus with neuropathy, chronic pain, open wound with necrotizing fasciitis of the sacral area which had been there since November 2017 Past medical history significant for diabetes mellitus, ankle ulcer, sacral ulcer, necrotizing fasciitis, arterial occlusive disease, tobacco abuse. Review of the electronic medical records reveals that Dr. 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 An MRI showed edema in the bone which was consistent with osteomyelitis and the chronic ulcer and may need surgical intervention. She also had a sacral decubitus ulcer which was treated with the wound VAC. On 01/07/2017 she was taken up by the vascular surgeon Dr. Trula Slade for an abdominal aortogram and bilateral lower extremity runoff, for a history of having bilateral femoropopliteal bypass graft as well as external iliac stenting on the right and stenting of her bypass graft. She had developed a nonhealing wound on her right ankle and there was a possibility of a femoral occlusion. the findings were noted and the impression was a surgical revascularization with a aorto bifemoral bypass graft. Both the femoropopliteal bypass grafts were patent. 01/31/2017 -- x-ray of the right hip and pelvis -- IMPRESSION:No radiographic evidence of acute osteomyelitis. Normal-appearing right hip joint space for age. No acute bony abnormality of the hip. 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 Since seeing her last week she has not had an appointment with infectious disease or the orthopedic specialist yet. 02/06/2017 -- the patient missed a couple of appointments yesterday due to the weather but other than that has apparently  given up smoking for the last 5 days. 02/13/2017 -- she has  rescheduled her infectious disease appointment and also the appointment with the orthopedic surgeon Dr. Sharol Given 03/06/2017 -- she was seen by Dr. Lucianne Lei dam of infectious disease on 03/05/2017 and he recommendedIV antibiotics but the patient did not want to have that and he has given her Augmentin and doxycycline and will reevaluate her in 2 months time. 03/13/2017 -- he saw Dr. Trula Slade regarding her vascular issues and he would like to wait to the sacral wound is completely closed before considering aortobifemoral bypass graft. He will see her back in 2 months time. She was also seen by Dr. Sharol Given of orthopedics who recommended continue wound care dressings and follow in the office in [name], [name]. ([id number]) about 2 months time. he also recommended 3 view radiographs of the right ankle at follow-up. 03/28/2017 -- she did have a PICC line placed and Dr. Lucianne Lei dam has begun IV vancomycin and ceftriaxone. she is going to continue this until she sees him in approximately a month's time 04/18/2017 -- we had applied for a skin substitute for the patient's care but her copayment is going to be about $300 a piece and the patient will not be able to afford this. 05/08/17 on evaluation today patient appears to potentially have a fungal rash in the periwound region of the sacral area. This also seems to extend into the inguinal creases and factional region as well. She is tender to palpation with light rubbing over the region of the periwound and the skin in this region does have a beefy red appearance. Everything seems to point to a fungal infection at this time. She has been tolerating the dressing changes up to this point. There is no evidence of infection otherwise. 05/16/2017 -- was seen by Dr. 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 He did not think that she is making progress and was not able to examine her sacral  decubitus ulcer as the patient would not allow it. She was switched to Bactrim DS 2 tablets twice a day since finishing her antibiotics. Her ESR was 76 and a CRP was 2.8. 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 05/23/17 on evaluation today patient's wound appears to be doing about the same in regard to the ankle as well as the gluteal area. She apparently has issues with the one back staying in place and she states the sill seems to break up the inferior aspect. She does have home help coming out to apply the wound VAC. She has been tolerating the dressing changes without any problem although she has had some increased pain in the gluteal region due to a fall she sustained and she feels like she brews that area and she had pain actually radiating down her leg which is slowly beginning to improve. There's no evidence of infection. 05/29/17 Unfortunately on evaluation today patient's wounds both in regard to the ankle as well as in regard to her gluteal region on the right appear to be measuring the same as last week's evaluation. She also tells me that she is having soreness today in regard to the right gluteal area because she had a visitor who came to see her a couple days ago and stayed for six hours and she had to set up  for that entire length of time visiting. This has called some increased discomfort. Normally she does not set up for those long periods of time. Fortunately there's no evidence of infection and no worsening of the wounds. 06/13/2017 -- the patient has had a lot of health issues including perfuse diarrhea and generalized weakness and I have instructed her to see her PCP as soon as possible and if things get worse to go to the ER. She has not brought her wound VAC today. She continues to be on oral antibiotics 06/30/17- she is here in follow up evaluation for right ischial and  right lateral malleolus ulcer. She continues with negative pressure wound therapy to the ischial wound. She is currently on a diabetic therapy per infectious disease, although she does state that she inconsistently takes it. She was advised to take her antibiotic as prescribed 07/11/2017 -- she has been unable to use a wound VAC regularly because she loses this evening and it has not been possible for her to use it. 07/18/2017 -- she is much more comfortable and looks better without the wound VAC and at this stage I believe it will be better for her to return the machine and continue with local care. 08/07/2017 -- she is back up to 3 weeks due to a vacation and during this time her wound VAC was returned. She has been doing local dressings 08/15/17 on evaluation today patient appears to be doing about the same in regard to her ankle as well as right gluteal wound. She has been tolerating the dressing changes and tells me that it's mainly her gluteal wound that hurts the ankle is nontender to palpation. She rates this pain to be a one out of 10 at rest and with cleansing of the wound five out of 10 at least. No fevers, chills, nausea, or vomiting noted at this time. She does note that home health has told her to inquire about possibly using silver nitrate on the wound edges due to the road nature of the edges. 08/29/2017 -- -- I got a note which Dr. Lucianne Lei dam sent to Dr. 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 He had recommended definitive surgery to fix this. He recommended to continue with Bactrim Right ankle x-ray -- IMPRESSION: Findings compatible with cellulitis of the lateral malleolar region. A small focus of bony destruction involving the tip of the lateral malleolus is worrisome for osteomyelitis. [name], [name] ([id number]) 09/05/2017 -- the patient was seen by Dr. Meridee Score yesterday who thought her  wound looked good and he would continue local care with silver alginate dressing and follow-up in 4 weeks. He did not recommend any surgical intervention of the ankle until there is good arterial flow and he would await Dr. Stephens Shire opinion regarding this. She was also seen by infectious disease Dr. Lucianne Lei dam, who was concerned about her elevated potassium and took her off Bactrim. he has recommended doxycycline. 09/26/2017 -- she is back after 3 weeks as she was out of town on vacation. She has put on some weight but other than that no change in her health 10/24/2017 -- she was seen by Dr. [name] -- after a thorough review he plans a angiographically via the left femoral approach to evaluate her left subclavian artery and address any stenosis at that time. He will also perform bilateral runoff to confirm that a right femoropopliteal as well as a left femoropopliteal bypass graft remained  patent. He is then considering a right axillary to femoral bypass graft for limb salvage. Electronic Signature(s) Signed: 10/24/2017 2:32:48 PM By: Christin Fudge MD, FACS Entered By: Christin Fudge on 10/24/2017 14:32:48 Dommer, [name] ([id number]) -------------------------------------------------------------------------------- Physical Exam Details Patient Name: ORALIA, CRIGER S. Date of Service: 10/24/2017 1:45 PM Medical Record Number: [id number] Patient Account Number: [id number] Date of Birth/Sex: [birth year] (51 y.o. Female) Treating RN: Roger Shelter Primary Care Provider: Delight Stare Other Clinician: Referring Provider: Delight Stare Treating Provider/Extender: Frann Rider in Treatment: 39 Constitutional . Pulse regular. Respirations normal and unlabored. Afebrile. . Eyes Nonicteric. Reactive to light. Ears, Nose, Mouth, and Throat Lips, teeth, and gums WNL.Marland Kitchen Moist mucosa without lesions. Neck supple and nontender. No palpable supraclavicular or cervical adenopathy. Normal sized  without goiter. Respiratory WNL. No retractions.. Cardiovascular Pedal Pulses WNL. No clubbing, cyanosis or edema. Lymphatic No adneopathy. No adenopathy. No adenopathy. Musculoskeletal Adexa without tenderness or enlargement.. Digits and nails w/o clubbing, cyanosis, infection, petechiae, ischemia, or inflammatory conditions.. Integumentary (Hair, Skin) No suspicious lesions. No crepitus or fluctuance. No peri-wound warmth or erythema. No masses.Marland Kitchen Psychiatric Judgement and insight Intact.. No evidence of depression, anxiety, or agitation.. Notes the wounds about the same and there is no plates difference in size, and the quality of the granulation tissue is healthy had no sharp debridement was required. Electronic Signature(s) Signed: 10/24/2017 2:33:25 PM By: Christin Fudge MD, FACS Entered By: Christin Fudge on 10/24/2017 14:33:25 Rabenold, [name] ([id number]) -------------------------------------------------------------------------------- Physician Orders Details Patient Name: ANGELITA, HARNACK. Date of Service: 10/24/2017 1:45 PM Medical Record Number: [id number] Patient Account Number: [id number] Date of Birth/Sex: 10/25/1966 (51 y.o. Female) Treating RN: Cornell Barman Primary Care Provider: Delight Stare Other Clinician: Referring Provider: Delight Stare Treating Provider/Extender: Frann Rider in Treatment: 56 Verbal / Phone Orders: No Diagnosis Coding Wound Cleansing Wound #1 Right,Lateral Malleolus o Clean wound with Normal Saline. Wound #2 Right Gluteus o Clean wound with Normal Saline. Skin Barriers/Peri-Wound Care Wound #1 Right,Lateral Malleolus o Skin Prep Wound #2 Right Gluteus o Skin Prep Primary Wound Dressing Wound #1 Right,Lateral Malleolus o [place] Non-Adherent - or equal Wound #2 Right Gluteus o [place] Non-Adherent - or equal Secondary Dressing Wound #1 Right,Lateral Malleolus o Boardered Foam Dressing Wound #2 Right Gluteus o  Boardered Foam Dressing Dressing Change Frequency Wound #1 Right,Lateral Malleolus o Change Dressing Monday, Wednesday, Friday Wound #2 Right Gluteus o Change Dressing Monday, Wednesday, Friday Follow-up Appointments Wound #1 Right,Lateral Malleolus o Return Appointment in 1 month Wound #2 Right Gluteus o Return Appointment in 1 month Off-Loading [name], [name] ([id number]) Wound #1 Right,Lateral Malleolus o Turn and reposition every 2 hours Wound #2 Right Gluteus o Turn and reposition every 2 hours Home Health Wound #1 [place] Visits - Monday, Wednesday, Friday o Home Health Nurse may visit PRN to address patientos wound care needs. o FACE TO FACE ENCOUNTER: MEDICARE and MEDICAID PATIENTS: I certify that this patient is under my care and that I had a face-to-face encounter that meets the physician face-to-face encounter requirements with this patient on this date. The encounter with the patient was in whole or in part for the following MEDICAL CONDITION: (primary reason for [place]) MEDICAL NECESSITY: I certify, that based on my findings, NURSING services are a medically necessary home health service. HOME BOUND STATUS: I certify that my clinical findings support that this patient is homebound (i.e., Due to illness or injury, pt requires aid of supportive devices such as crutches,  cane, wheelchairs, walkers, the use of special transportation or the assistance of another person to leave their place of residence. There is a normal inability to leave the home and doing so requires considerable and taxing effort. Other absences are for medical reasons / religious services and are infrequent or of short duration when for other reasons). o If current dressing causes regression in wound condition, may D/C ordered dressing product/s and apply Normal Saline Moist Dressing daily until next [place] / Other MD  appointment. [place] of regression in wound condition at [phone]. o Please direct any NON-WOUND related issues/requests for orders to patient's Primary Care Physician Wound #2 Right [place] Visits - Monday, Wednesday, Friday o Home Health Nurse may visit PRN to address patientos wound care needs. o FACE TO FACE ENCOUNTER: MEDICARE and MEDICAID PATIENTS: I certify that this patient is under my care and that I had a face-to-face encounter that meets the physician face-to-face encounter requirements with this patient on this date. The encounter with the patient was in whole or in part for the following MEDICAL CONDITION: (primary reason for [place]) MEDICAL NECESSITY: I certify, that based on my findings, NURSING services are a medically necessary home health service. HOME BOUND STATUS: I certify that my clinical findings support that this patient is homebound (i.e., Due to illness or injury, pt requires aid of supportive devices such as crutches, cane, wheelchairs, walkers, the use of special transportation or the assistance of another person to leave their place of residence. There is a normal inability to leave the home and doing so requires considerable and taxing effort. Other absences are for medical reasons / religious services and are infrequent or of short duration when for other reasons). o If current dressing causes regression in wound condition, may D/C ordered dressing product/s and apply Normal Saline Moist Dressing daily until next [place] / Other MD appointment. [place] of regression in wound condition at [phone]. o Please direct any NON-WOUND related issues/requests for orders to patient's Primary Care Physician Electronic Signature(s) Signed: 10/24/2017 4:35:05 PM By: Christin Fudge MD, FACS Signed: 10/24/2017 5:11:28 PM By: Gretta Cool, [name], RN, CWS, [name], [name] Entered By: Gretta Cool,  [name], RN, CWS, Kim on 10/24/2017 14:20:01 Oettinger, [name] ([id number]) -------------------------------------------------------------------------------- Progress Note Details Patient Name: [name], [name]. Date of Service: 10/24/2017 1:45 PM Medical Record Number: [id number] Patient Account Number: [id number] Date of Birth/Sex: 12/28/1965 (51 y.o. Female) Treating RN: Roger Shelter Primary Care Provider: Delight Stare Other Clinician: Referring Provider: Delight Stare Treating Provider/Extender: Frann Rider in Treatment: 39 Subjective History of Present Illness (HPI) The following HPI elements were documented for the patient's wound: Location: right lateral ankle and right gluteal area Quality: Patient reports experiencing a sharp pain to affected area(s). Severity: Patient states wound are getting better slowly Duration: Patient has had the wound for > 4 months prior to seeking treatment at the wound center Timing: Pain in wound is constant (hurts all the time) Context: The wound occurred when the patient was in hospital with a necrotizing fasciitis of the right gluteal area and a ulceration on the right ankle Modifying Factors: Other treatment(s) tried include:admitted to the hospital for IV antibiotics and a full workup and has also had a recent angiogram Associated Signs and Symptoms: Patient reports having increase discharge. 51 year old patient was sent to [place] from [facility] where she was seen by Dr. Sherril Cong for a left ankle ulceration  and was recently hospitalized with hypotension and sepsis. She was treated with IV antibiotics and has been scheduled to see Dr. Sharol Given. She was seen by vascular surgery who recommended a femoral bypass but surgery has been delayed until her sacral wound from last year's necrotizing fasciitis has healed. Was seeing the wound care team at [facility] but wanted to change over. She is a smoker and smokes a pack of cigarettes a day The  patient was recently admitted in [place] between February 2 and February 14. She had a follow-up to see vascular surgery, orthopedic surgery and infectious disease. 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 Review of the electronic medical records reveals that Dr. 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 An MRI showed edema in the bone which was consistent with osteomyelitis and the chronic ulcer and may need surgical intervention. She also had a sacral decubitus ulcer which was treated with the wound VAC. On 01/07/2017 she was taken up by the vascular surgeon Dr. Trula Slade for an abdominal aortogram and bilateral lower extremity runoff, for a history of having bilateral femoropopliteal bypass graft as well as external iliac stenting on the right and stenting of her bypass graft. She had developed a nonhealing wound on her right ankle and there was a possibility of a femoral occlusion. the findings were noted and the impression was a surgical revascularization with a aorto bifemoral bypass graft. Both the femoropopliteal bypass grafts were patent. 01/31/2017 -- x-ray of the right hip and pelvis -- IMPRESSION:No radiographic evidence of acute osteomyelitis. Normal-appearing right hip joint space for age. No acute bony abnormality of the hip. 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 Since seeing her last week she has not had an appointment with infectious disease or the orthopedic specialist yet. 02/06/2017  -- the patient missed a couple of appointments yesterday due to the weather but other than that has apparently given up smoking for the last 5 days. 02/13/2017 -- she has rescheduled her infectious disease appointment and also the appointment with the orthopedic surgeon Dr. Sharol Given 03/06/2017 -- she was seen by Dr. Lucianne Lei dam of infectious disease on 03/05/2017 and he recommendedIV antibiotics but the patient did not want to have that and he has given her Augmentin and doxycycline and will reevaluate her in 2 months time. 03/13/2017 -- he saw Dr. Trula Slade regarding her vascular issues and he would like to wait to the sacral wound is completely closed before considering aortobifemoral bypass graft. He will see her back in 2 months time. She was also seen by Dr. Sharol Given of orthopedics who recommended continue wound care dressings and follow in the office in [place], [place]. ([id number]) about 2 months time. he also recommended 3 view radiographs of the right ankle at follow-up. 03/28/2017 -- she did have a PICC line placed and Dr. Lucianne Lei dam has begun IV vancomycin and ceftriaxone. she is going to continue this until she sees him in approximately a month's time 04/18/2017 -- we had applied for a skin substitute for the patient's care but her copayment is  going to be about $300 a piece and the patient will not be able to afford this. 05/08/17 on evaluation today patient appears to potentially have a fungal rash in the periwound region of the sacral area. This also seems to extend into the inguinal creases and factional region as well. She is tender to palpation with light rubbing over the region of the periwound and the skin in this region does have a beefy red appearance. Everything seems to point to a fungal infection at this time. She has been tolerating the dressing changes up to this point. There is no evidence of infection otherwise. 05/16/2017 -- was seen by Dr. 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 He did not think that she is making progress and was not able to examine her sacral decubitus ulcer as the patient would not allow it. She was switched to Bactrim DS 2 tablets twice a day since finishing her antibiotics. Her ESR was 76 and a CRP was 2.8. 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 05/23/17 on evaluation today patient's wound appears to be doing about the same in regard to the ankle as well as the gluteal area. She apparently has issues with the one back staying in place and she states the sill seems to break up the inferior aspect. She does have home help coming out to apply the wound VAC. She has been tolerating the dressing changes without any problem although she has had some increased pain in the gluteal region due to a fall she sustained and she feels like she brews that area and she had pain actually radiating down her leg which is slowly beginning to improve. There's no evidence of infection. 05/29/17 Unfortunately on evaluation today patient's wounds both in regard to the ankle as well as in regard to her gluteal region on the right appear to be measuring the same as last week's evaluation. She also tells me that she is having soreness today in regard to the right gluteal area because she had a visitor who came to see her a couple days ago and stayed for six hours and she had to set up for that entire length of time visiting. This has called some increased discomfort. Normally she does not set up for those long periods of time. Fortunately there's no evidence of infection and no worsening of the wounds. 06/13/2017 -- the patient has had a lot of health issues including perfuse diarrhea and generalized weakness and I have instructed her to see her PCP as soon as possible and if things get worse  to go to the ER. She has not brought her wound VAC today. She continues to be on oral antibiotics 06/30/17- she is here in follow up evaluation for right ischial and right lateral malleolus ulcer. She continues with negative pressure wound therapy to the ischial wound. She is currently on a diabetic therapy per infectious disease, although she does state that she inconsistently takes it. She was advised to take her antibiotic as prescribed 07/11/2017 -- she has been unable to use a wound VAC regularly because she loses this evening and it has not been possible for her to use it. 07/18/2017 -- she is much more comfortable and looks better without the wound VAC and at this stage I believe it will be better  for her to return the machine and continue with local care. 08/07/2017 -- she is back up to 3 weeks due to a vacation and during this time her wound VAC was returned. She has been doing local dressings 08/15/17 on evaluation today patient appears to be doing about the same in regard to her ankle as well as right gluteal wound. She has been tolerating the dressing changes and tells me that it's mainly her gluteal wound that hurts the ankle is nontender to palpation. She rates this pain to be a one out of 10 at rest and with cleansing of the wound five out of 10 at least. No fevers, chills, nausea, or vomiting noted at this time. She does note that home health has told her to inquire about possibly using silver nitrate on the wound edges due to the road nature of the edges. 08/29/2017 -- -- I got a note which Dr. Lucianne Lei dam sent to Dr. 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 He had recommended definitive surgery to fix this. He recommended to continue with Bactrim Right ankle x-ray -- IMPRESSION: Findings compatible with cellulitis of the lateral malleolar region. A small focus of bony destruction involving the tip of  the lateral malleolus is worrisome for osteomyelitis. [name], [name] ([id number]) 09/05/2017 -- the patient was seen by Dr. Meridee Score yesterday who thought her wound looked good and he would continue local care with silver alginate dressing and follow-up in 4 weeks. He did not recommend any surgical intervention of the ankle until there is good arterial flow and he would await Dr. Stephens Shire opinion regarding this. She was also seen by infectious disease Dr. Lucianne Lei dam, who was concerned about her elevated potassium and took her off Bactrim. he has recommended doxycycline. 09/26/2017 -- she is back after 3 weeks as she was out of town on vacation. She has put on some weight but other than that no change in her health 10/24/2017 -- she was seen by Dr. [name] -- after a thorough review he plans a angiographically via the left femoral approach to evaluate her left subclavian artery and address any stenosis at that time. He will also perform bilateral runoff to confirm that a right femoropopliteal as well as a left femoropopliteal bypass graft remained patent. He is then considering a right axillary to femoral bypass graft for limb salvage. Patient History Information obtained from Patient. Family History Diabetes - Mother,Father,Maternal Grandparents,Paternal Grandparents,Child, Heart Disease - [place] Grandparents, Hereditary Spherocytosis - Mother, Hypertension - Mother,Siblings,Paternal Grandparents,Maternal Grandparents, Seizures - Maternal Grandparents,Paternal Grandparents, Thyroid Problems - Siblings, No family history of Cancer, Kidney Disease, Lung Disease, Stroke, Tuberculosis. Social History Current every day smoker, Marital Status - Married, Alcohol Use - Rarely, Drug Use - No History, Caffeine Use - Moderate. Objective Constitutional Pulse regular. Respirations normal and unlabored. Afebrile. Vitals Time Taken: 2:02 PM, Height: 67 in,  Weight: 137 lbs, BMI: 21.5, Temperature: 97.8 F, Pulse: 94 bpm, Respiratory Rate: 16 breaths/min, Blood Pressure: 81/55 mmHg. Eyes Nonicteric. Reactive to light. Ears, Nose, Mouth, and Throat Lips, teeth, and gums WNL.Marland Kitchen Moist mucosa without lesions. Neck supple and nontender. No palpable supraclavicular or cervical adenopathy. Normal sized without goiter. Respiratory WNL. No retractions.Marland Kitchen [name], [name]. ([id number]) Cardiovascular Pedal Pulses WNL. No clubbing, cyanosis or edema. Lymphatic No adneopathy. No adenopathy. No adenopathy. Musculoskeletal Adexa without tenderness or enlargement.. Digits and nails w/o clubbing, cyanosis, infection, petechiae, ischemia, or inflammatory conditions.Marland Kitchen Psychiatric Judgement  and insight Intact.. No evidence of depression, anxiety, or agitation.. General Notes: the wounds about the same and there is no plates difference in size, and the quality of the granulation tissue is healthy had no sharp debridement was required. Integumentary (Hair, Skin) No suspicious lesions. No crepitus or fluctuance. No peri-wound warmth or erythema. No masses.. Wound #1 status is Open. Original cause of wound was Gradually Appeared. The wound is located on the Right,Lateral Malleolus. The wound measures 1.2cm length x 1.2cm width x 0.5cm depth; 1.131cm^2 area and 0.565cm^3 volume. There is Fat Layer (Subcutaneous Tissue) Exposed exposed. There is no tunneling or undermining noted. There is a large amount of sanguinous drainage noted. The wound margin is distinct with the outline attached to the wound base. There is medium (34-66%) pink granulation within the wound bed. There is a medium (34-66%) amount of necrotic tissue within the wound bed including Adherent Slough. The periwound skin appearance exhibited: Induration, Maceration, Erythema. The periwound skin appearance did not exhibit: Callus, Crepitus, Excoriation, Rash, Scarring, Dry/Scaly, Atrophie Blanche,  Cyanosis, Ecchymosis, Hemosiderin Staining, Mottled, Pallor, Rubor. The surrounding wound skin color is noted with erythema which is circumferential. Periwound temperature was noted as No Abnormality. The periwound has tenderness on palpation. Wound #2 status is Open. Original cause of wound was Gradually Appeared. The wound is located on the Right Gluteus. The wound measures 2.1cm length x 1cm width x 1.2cm depth; 1.649cm^2 area and 1.979cm^3 volume. There is Fat Layer (Subcutaneous Tissue) Exposed exposed. There is a large amount of serous drainage noted. Foul odor after cleansing was noted. The wound margin is epibole. There is medium (34-66%) red, pink granulation within the wound bed. There is a medium (34-66%) amount of necrotic tissue within the wound bed including Adherent Slough. The periwound skin appearance exhibited: Ecchymosis. The periwound skin appearance did not exhibit: Callus, Crepitus, Excoriation, Induration, Rash, Scarring, Dry/Scaly, Maceration, Atrophie Blanche, Cyanosis, Hemosiderin Staining, Mottled, Pallor, Rubor, Erythema. Periwound temperature was noted as No Abnormality. The periwound has tenderness on palpation. Plan Wound Cleansing: Wound #1 Right,Lateral Malleolus: Clean wound with Normal Saline. Wound #2 Right Gluteus: Clean wound with Normal Saline. Skin Barriers/Peri-Wound Care: Wound #1 Right,Lateral Malleolus: Skin Prep Wound #2 Right Gluteus: Skin Prep Primary Wound Dressing: Wound #1 Right,Lateral Malleolus: Silvercel Non-Adherent - or equal Wound #2 Right Gluteus: Stevison, Makelle S. ([id number]) Silvercel Non-Adherent - or equal Secondary Dressing: Wound #1 Right,Lateral Malleolus: Boardered Foam Dressing Wound #2 Right Gluteus: Boardered Foam Dressing Dressing Change Frequency: Wound #1 Right,Lateral Malleolus: Change Dressing Monday, Wednesday, Friday Wound #2 Right Gluteus: Change Dressing Monday, Wednesday, Friday Follow-up  Appointments: Wound #1 Right,Lateral Malleolus: Return Appointment in 1 month Wound #2 Right Gluteus: Return Appointment in 1 month Off-Loading: Wound #1 Right,Lateral Malleolus: Turn and reposition every 2 hours Wound #2 Right Gluteus: Turn and reposition every 2 hours Home Health: Wound #1 Right,Lateral Malleolus: [place] Visits - Monday, Wednesday, Friday [place] Nurse may visit PRN to address patient s wound care needs. FACE TO FACE ENCOUNTER: MEDICARE and MEDICAID PATIENTS: I certify that this patient is under my care and that I had a face-to-face encounter that meets the physician face-to-face encounter requirements with this patient on this date. The encounter with the patient was in whole or in part for the following MEDICAL CONDITION: (primary reason for [place]) MEDICAL NECESSITY: I certify, that based on my findings, NURSING services are a medically necessary home health service. HOME BOUND STATUS: I certify that my clinical findings support that  this patient is homebound (i.e., Due to illness or injury, pt requires aid of supportive devices such as crutches, cane, wheelchairs, walkers, the use of special transportation or the assistance of another person to leave their place of residence. There is a normal inability to leave the home and doing so requires considerable and taxing effort. Other absences are for medical reasons / religious services and are infrequent or of short duration when for other reasons). If current dressing causes regression in wound condition, may D/C ordered dressing product/s and apply Normal Saline Moist Dressing daily until next [place] / Other MD appointment. [place] of regression in wound condition at [phone]. Please direct any NON-WOUND related issues/requests for orders to patient's Primary Care Physician Wound #2 Right Gluteus: [place] Visits - Monday, Wednesday,  Friday Home Health Nurse may visit PRN to address patient s wound care needs. FACE TO FACE ENCOUNTER: MEDICARE and MEDICAID PATIENTS: I certify that this patient is under my care and that I had a face-to-face encounter that meets the physician face-to-face encounter requirements with this patient on this date. The encounter with the patient was in whole or in part for the following MEDICAL CONDITION: (primary reason for [place]) MEDICAL NECESSITY: I certify, that based on my findings, NURSING services are a medically necessary home health service. HOME BOUND STATUS: I certify that my clinical findings support that this patient is homebound (i.e., Due to illness or injury, pt requires aid of supportive devices such as crutches, cane, wheelchairs, walkers, the use of special transportation or the assistance of another person to leave their place of residence. There is a normal inability to leave the home and doing so requires considerable and taxing effort. Other absences are for medical reasons / religious services and are infrequent or of short duration when for other reasons). If current dressing causes regression in wound condition, may D/C ordered dressing product/s and apply Normal Saline Moist Dressing daily until next [place] / Other MD appointment. [place] of regression in wound condition at [phone]. Please direct any NON-WOUND related issues/requests for orders to patient's Primary Care Physician [name], [name] ([id number]) after review today and noting such is going to be having an extensive vascular procedure next week I have recommended we continue local care, and have recommended: 1. Silver alginate to be applied to the right ankle, changed every other day 2. Silver alginate with a gauze bolster to the right gluteal region to be changed 3 times a week. 3. adequate protein, vitamin A, vitamin C and zinc 4. Continue suppressive doses of  doxycycline as per Dr. Lucianne Lei dam 5. She will return to see as in 2-3 weeks as she is has several other appointments Electronic Signature(s) Signed: 10/24/2017 2:35:29 PM By: Christin Fudge MD, FACS Entered By: Christin Fudge on 10/24/2017 14:35:28 Biegler, [name] ([id number]) -------------------------------------------------------------------------------- ROS/PFSH Details Patient Name: Jamie Martyr. Date of Service: 10/24/2017 1:45 PM Medical Record Number: [id number] Patient Account Number: [id number] Date of Birth/Sex: June 10, 1966 (51 y.o. Female) Treating RN: Roger Shelter Primary Care Provider: Delight Stare Other Clinician: Referring Provider: Delight Stare Treating Provider/Extender: Frann Rider in Treatment: 39 Information Obtained From Patient Wound History Do you currently have one or more open woundso Yes How many open wounds do you currently haveo 2 Approximately how long have you had your woundso months How have you been treating your wound(s) until nowo wound vac/ silver alginate Has your wound(s) ever healed and  then re-openedo No Have you had any lab work done in the past montho No Have you tested positive for an antibiotic resistant organism (MRSA, VRE)o No Have you tested positive for osteomyelitis (bone infection)o No Have you had any tests for circulation on your legso No Have you had other problems associated with your woundso Infection Eyes Medical History: Negative for: Cataracts; Glaucoma; Optic Neuritis Ear/Nose/Mouth/Throat Medical History: Negative for: Chronic sinus problems/congestion; Middle ear problems Hematologic/Lymphatic Medical History: Positive for: Anemia Negative for: Hemophilia; Human Immunodeficiency Virus; Lymphedema; Sickle Cell Disease Respiratory Medical History: Negative for: Aspiration; Asthma; Chronic Obstructive Pulmonary Disease (COPD); Pneumothorax; Sleep Apnea; Tuberculosis Cardiovascular Medical History: Positive  for: Arrhythmia; Coronary Artery Disease; Hypotension; Peripheral Arterial Disease Gastrointestinal Medical History: Negative for: Cirrhosis ; Colitis; Crohnos; Hepatitis A; Hepatitis B; Hepatitis C Endocrine Medical History: Positive for: Type II Diabetes [name], [name] ([id number]) Treated with: Insulin, Oral agents Blood sugar tested every day: Yes Tested : 4 times a day Blood sugar testing results: Bedtime: 190 Genitourinary Medical History: Negative for: End Stage Renal Disease Immunological Medical History: Negative for: Lupus Erythematosus; Raynaudos; Scleroderma Integumentary (Skin) Medical History: Negative for: History of Burn; History of pressure wounds Musculoskeletal Medical History: Positive for: Osteoarthritis Neurologic Medical History: Negative for: Dementia; Neuropathy; Quadriplegia; Paraplegia; Seizure Disorder Oncologic Medical History: Negative for: Received Chemotherapy; Received Radiation Psychiatric Medical History: Negative for: Anorexia/bulimia; Confinement Anxiety Immunizations Pneumococcal Vaccine: Received Pneumococcal Vaccination: No Implantable Devices Family and Social History Cancer: No; Diabetes: Yes - Mother,Father,Maternal Grandparents,Paternal [place]; Heart Disease: Yes - Father,Mother,Maternal Grandparents,Paternal Grandparents; Hereditary Spherocytosis: Yes - Mother; Hypertension: Yes - Mother,Siblings,Paternal Grandparents,Maternal Grandparents; Kidney Disease: No; Lung Disease: No; Seizures: Yes - Maternal Grandparents,Paternal Grandparents; Stroke: No; Thyroid Problems: Yes - Siblings; Tuberculosis: No; Current every day smoker; Marital Status - Married; Alcohol Use: Rarely; Drug Use: No History; Caffeine Use: Moderate; Financial Concerns: No; Food, Clothing or Shelter Needs: No; Support System Lacking: No; Transportation Concerns: No; Advanced Directives: No; Patient does not want information on Advanced Directives;  Living Will: No Physician Affirmation I have reviewed and agree with the above information. [name], [name] ([id number]) Electronic Signature(s) Signed: 10/24/2017 4:35:05 PM By: Christin Fudge MD, FACS Signed: 10/24/2017 4:47:10 PM By: Roger Shelter Entered By: Christin Fudge on 10/24/2017 14:32:56 [name], [name] ([id number]) -------------------------------------------------------------------------------- SuperBill Details Patient Name: DORINNE, GRAEFF. Date of Service: 10/24/2017 Medical Record Number: [id number] Patient Account Number: [id number] Date of Birth/Sex: July 30, 1966 (51 y.o. 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, right ankle and foot Facility Procedures CPT4 Code: 41937902 Description: 99213 - WOUND CARE VISIT-LEV 3 EST PT Modifier: Quantity: 1 Physician Procedures CPT4 Code: 4097353 Description: 29924 - WC PHYS LEVEL 3 - EST PT ICD-10 Diagnosis Description E11.621 Type 2 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, stage 4 L97.312 Non-pressure chronic ulcer of right ankle with fat layer expos I70.233  Atherosclerosis of native arteries of right leg with ulceratio Modifier: ed n of ankle Quantity: 1 Electronic Signature(s) Signed: 10/24/2017 2:35:51 PM By: Christin Fudge MD, FACS Entered By: Christin Fudge on 10/24/2017 14:35:50

## 2017-10-31 ENCOUNTER — Encounter (HOSPITAL_COMMUNITY)
Admission: RE | Disposition: A | Discharge status: Home / Self Care | Payer: Self-pay | Source: Ambulatory Visit | Attending: Vascular Surgery

## 2017-10-31 ENCOUNTER — Ambulatory Visit (HOSPITAL_COMMUNITY)
Admission: RE | Admit: 2017-10-31 | Discharge: 2017-10-31 | Disposition: A | Discharge status: Home / Self Care | Payer: Medicare Other | Source: Ambulatory Visit | Attending: Vascular Surgery | Admitting: Vascular Surgery

## 2017-10-31 ENCOUNTER — Encounter (HOSPITAL_COMMUNITY): Payer: Self-pay | Admitting: Vascular Surgery

## 2017-10-31 DIAGNOSIS — Z7982 Long term (current) use of aspirin: Secondary | ICD-10-CM | POA: Diagnosis not present

## 2017-10-31 DIAGNOSIS — Z79899 Other long term (current) drug therapy: Secondary | ICD-10-CM | POA: Insufficient documentation

## 2017-10-31 DIAGNOSIS — Z87891 Personal history of nicotine dependence: Secondary | ICD-10-CM | POA: Diagnosis not present

## 2017-10-31 DIAGNOSIS — E785 Hyperlipidemia, unspecified: Secondary | ICD-10-CM | POA: Insufficient documentation

## 2017-10-31 DIAGNOSIS — Z794 Long term (current) use of insulin: Secondary | ICD-10-CM | POA: Insufficient documentation

## 2017-10-31 DIAGNOSIS — E1151 Type 2 diabetes mellitus with diabetic peripheral angiopathy without gangrene: Secondary | ICD-10-CM | POA: Diagnosis present

## 2017-10-31 DIAGNOSIS — I739 Peripheral vascular disease, unspecified: Secondary | ICD-10-CM | POA: Diagnosis not present

## 2017-10-31 HISTORY — PX: AORTIC ARCH ANGIOGRAPHY: CATH118224

## 2017-10-31 HISTORY — PX: ABDOMINAL AORTOGRAM W/LOWER EXTREMITY: CATH118223

## 2017-10-31 LAB — POCT I-STAT, CHEM 8
BUN: 26 mg/dL — AB (ref 6–20)
CALCIUM ION: 1.15 mmol/L (ref 1.15–1.40)
CHLORIDE: 100 mmol/L — AB (ref 101–111)
CREATININE: 1 mg/dL (ref 0.44–1.00)
Glucose, Bld: 152 mg/dL — ABNORMAL HIGH (ref 65–99)
HEMATOCRIT: 40 % (ref 36.0–46.0)
Hemoglobin: 13.6 g/dL (ref 12.0–15.0)
Potassium: 5.6 mmol/L — ABNORMAL HIGH (ref 3.5–5.1)
SODIUM: 139 mmol/L (ref 135–145)
TCO2: 33 mmol/L — AB (ref 22–32)

## 2017-10-31 LAB — GLUCOSE, CAPILLARY
Glucose-Capillary: 151 mg/dL — ABNORMAL HIGH (ref 65–99)
Glucose-Capillary: 149 mg/dL — ABNORMAL HIGH (ref 65–99)

## 2017-10-31 SURGERY — ABDOMINAL AORTOGRAM W/LOWER EXTREMITY
Anesthesia: LOCAL

## 2017-10-31 MED ORDER — FENTANYL CITRATE (PF) 100 MCG/2ML IJ SOLN
INTRAMUSCULAR | Status: AC
  Filled 2017-10-31: qty 2

## 2017-10-31 MED ORDER — SODIUM CHLORIDE 0.9 % IV SOLN: INTRAVENOUS | Status: AC

## 2017-10-31 MED ORDER — MORPHINE SULFATE (PF) 10 MG/ML IV SOLN: 2.0000 mg | INTRAVENOUS | Status: DC | PRN

## 2017-10-31 MED ORDER — IODIXANOL 320 MG/ML IV SOLN
INTRAVENOUS | Status: DC | PRN
  Administered 2017-10-31: 177 mL via INTRA_ARTERIAL

## 2017-10-31 MED ORDER — HEPARIN (PORCINE) IN NACL 2-0.9 UNIT/ML-% IJ SOLN
INTRAMUSCULAR | Status: AC
  Filled 2017-10-31: qty 1000

## 2017-10-31 MED ORDER — LIDOCAINE HCL (PF) 1 % IJ SOLN
INTRAMUSCULAR | Status: AC
  Filled 2017-10-31: qty 30

## 2017-10-31 MED ORDER — LABETALOL HCL 5 MG/ML IV SOLN: 10.0000 mg | INTRAVENOUS | Status: DC | PRN

## 2017-10-31 MED ORDER — HEPARIN (PORCINE) IN NACL 2-0.9 UNIT/ML-% IJ SOLN
INTRAMUSCULAR | Status: AC | PRN
  Administered 2017-10-31: 1000 mL via INTRA_ARTERIAL

## 2017-10-31 MED ORDER — MIDAZOLAM HCL 2 MG/2ML IJ SOLN
INTRAMUSCULAR | Status: AC
  Filled 2017-10-31: qty 2

## 2017-10-31 MED ORDER — SODIUM CHLORIDE 0.9 % IV SOLN: 250.0000 mL | INTRAVENOUS | Status: DC | PRN

## 2017-10-31 MED ORDER — SODIUM CHLORIDE 0.9% FLUSH: 3.0000 mL | Freq: Two times a day (BID) | INTRAVENOUS | Status: DC

## 2017-10-31 MED ORDER — SODIUM CHLORIDE 0.9 % IV SOLN
INTRAVENOUS | Status: DC
  Administered 2017-10-31: 07:00:00 via INTRAVENOUS

## 2017-10-31 MED ORDER — LIDOCAINE HCL (PF) 1 % IJ SOLN
INTRAMUSCULAR | Status: DC | PRN
  Administered 2017-10-31: 10 mL

## 2017-10-31 MED ORDER — SODIUM CHLORIDE 0.9 % IV BOLUS (SEPSIS): 1000.0000 mL | Freq: Once | INTRAVENOUS | Status: DC

## 2017-10-31 MED ORDER — OXYCODONE HCL 5 MG PO TABS: 5.0000 mg | ORAL_TABLET | ORAL | Status: DC | PRN

## 2017-10-31 MED ORDER — FENTANYL CITRATE (PF) 100 MCG/2ML IJ SOLN
INTRAMUSCULAR | Status: DC | PRN
  Administered 2017-10-31 (×2): 25 ug via INTRAVENOUS

## 2017-10-31 MED ORDER — MIDAZOLAM HCL 2 MG/2ML IJ SOLN
INTRAMUSCULAR | Status: DC | PRN
  Administered 2017-10-31 (×2): 2 mg via INTRAVENOUS

## 2017-10-31 MED ORDER — SODIUM CHLORIDE 0.9% FLUSH: 3.0000 mL | INTRAVENOUS | Status: DC | PRN

## 2017-10-31 MED ORDER — HYDRALAZINE HCL 20 MG/ML IJ SOLN: 5.0000 mg | INTRAMUSCULAR | Status: DC | PRN

## 2017-10-31 SURGICAL SUPPLY — 10 items
CATH ANGIO 5F PIGTAIL 100CM (CATHETERS) ×1 IMPLANT
CATH ANGIO 5F PIGTAIL 65CM (CATHETERS) ×1 IMPLANT
COVER PRB 48X5XTLSCP FOLD TPE (BAG) IMPLANT
COVER PROBE 5X48 (BAG) ×2
KIT PV (KITS) ×2 IMPLANT
SHEATH PINNACLE 5F 10CM (SHEATH) ×1 IMPLANT
SYR MEDRAD MARK V 150ML (SYRINGE) ×2 IMPLANT
TRANSDUCER W/STOPCOCK (MISCELLANEOUS) ×2 IMPLANT
TRAY PV CATH (CUSTOM PROCEDURE TRAY) ×2 IMPLANT
WIRE HITORQ VERSACORE ST 145CM (WIRE) ×1 IMPLANT

## 2017-10-31 NOTE — Interval H&P Note (Signed)
History and Physical Interval Note:  10/31/2017 7:35 AM  [name]  has presented today for surgery, with the diagnosis of pad  The various methods of treatment have been discussed with the patient and family. After consideration of risks, benefits and other options for treatment, the patient has consented to  Procedure(s): ABDOMINAL AORTOGRAM W/LOWER EXTREMITY (N/A) AORTIC ARCH ANGIOGRAPHY (N/A) as a surgical intervention .  The patient's history has been reviewed, patient examined, no change in status, stable for surgery.  I have reviewed the patient's chart and labs.  Questions were answered to the patient's satisfaction.     Ruta Hinds

## 2017-10-31 NOTE — Discharge Instructions (Signed)

## 2017-10-31 NOTE — Progress Notes (Signed)
[name] notified of patients asymptomatic low blood pressures.  New verbal order received for fluid bolus and entered in epic.

## 2017-10-31 NOTE — Progress Notes (Addendum)
JALEEN, GRUPP ([id number]) Visit Report for 10/24/2017 Arrival Information Details Patient Name: [name], [name]. Date of Service: 10/24/2017 1:45 PM Medical Record Number: [id number] Patient Account Number: [id number] Date of Birth/Sex: [birth year] (51 y.o. Female) Treating RN: Cornell Barman Primary Care Cyrena Kuchenbecker: Delight Stare Other Clinician: Referring Kerry Chisolm: Delight Stare Treating Alyxandra Tenbrink/Extender: Frann Rider in Treatment: 39 Visit Information History Since Last Visit Added or deleted any medications: No Patient Arrived: Cane Any new allergies or adverse reactions: No Arrival Time: 14:01 Had a fall or experienced change in No Accompanied By: self activities of daily living that may affect Transfer Assistance: None risk of falls: Patient Identification Verified: Yes Signs or symptoms of abuse/neglect since last visito No Secondary Verification Process Completed: Yes Hospitalized since last visit: No Patient Requires Transmission-Based Precautions: No Has Dressing in Place as Prescribed: Yes Patient Has Alerts: No Pain Present Now: No Electronic Signature(s) Signed: 10/24/2017 5:11:28 PM By: Gretta Cool, [name], RN, CWS, [name], [name] Entered By: Gretta Cool, [name], RN, CWS, Kim on 10/24/2017 14:02:11 Mealey, [name] ([id number]) -------------------------------------------------------------------------------- Clinic Level of Care Assessment Details Patient Name: [name], [name]. Date of Service: 10/24/2017 1:45 PM Medical Record Number: [id number] Patient Account Number: [id number] Date of Birth/Sex: 04-18-1966 (51 y.o. Female) Treating RN: Cornell Barman Primary Care Arsenio Schnorr: Delight Stare Other Clinician: Referring Chantal Worthey: Delight Stare Treating Winda Summerall/Extender: Frann Rider in Treatment: 39 Clinic Level of Care Assessment Items TOOL 4 Quantity Score []  - Use when only an EandM is performed on FOLLOW-UP visit 0 ASSESSMENTS - Nursing Assessment / Reassessment []  - Reassessment  of Co-morbidities (includes updates in patient status) 0 X- 1 5 Reassessment of Adherence to Treatment Plan ASSESSMENTS - Wound and Skin Assessment / Reassessment []  - Simple Wound Assessment / Reassessment - one wound 0 X- 2 5 Complex Wound Assessment / Reassessment - multiple wounds []  - 0 Dermatologic / Skin Assessment (not related to wound area) ASSESSMENTS - Focused Assessment []  - Circumferential Edema Measurements - multi extremities 0 []  - 0 Nutritional Assessment / Counseling / Intervention []  - 0 Lower Extremity Assessment (monofilament, tuning fork, pulses) []  - 0 Peripheral Arterial Disease Assessment (using hand held doppler) ASSESSMENTS - Ostomy and/or Continence Assessment and Care []  - Incontinence Assessment and Management 0 []  - 0 Ostomy Care Assessment and Management (repouching, etc.) PROCESS - Coordination of Care X - Simple Patient / Family Education for ongoing care 1 15 []  - 0 Complex (extensive) Patient / Family Education for ongoing care X- 1 10 Staff obtains Programmer, systems, Records, Test Results / Process Orders []  - 0 Staff telephones HHA, Nursing Homes / Clarify orders / etc []  - 0 Routine Transfer to another Facility (non-emergent condition) []  - 0 Routine Hospital Admission (non-emergent condition) []  - 0 New Admissions / Biomedical engineer / Ordering NPWT, Apligraf, etc. []  - 0 Emergency Hospital Admission (emergent condition) X- 1 10 Simple Discharge Coordination Ozer, Kailoni S. ([id number]) []  - 0 Complex (extensive) Discharge Coordination PROCESS - Special Needs []  - Pediatric / Minor Patient Management 0 []  - 0 Isolation Patient Management []  - 0 Hearing / Language / Visual special needs []  - 0 Assessment of Community assistance (transportation, D/C planning, etc.) []  - 0 Additional assistance / Altered mentation []  - 0 Support Surface(s) Assessment (bed, cushion, seat, etc.) INTERVENTIONS - Wound Cleansing / Measurement []  -  Simple Wound Cleansing - one wound 0 X- 2 5 Complex Wound Cleansing - multiple wounds X- 1 5 Wound Imaging (photographs - any number of wounds) []  -  0 Wound Tracing (instead of photographs) []  - 0 Simple Wound Measurement - one wound X- 2 5 Complex Wound Measurement - multiple wounds INTERVENTIONS - Wound Dressings []  - Small Wound Dressing one or multiple wounds 0 X- 2 15 Medium Wound Dressing one or multiple wounds []  - 0 Large Wound Dressing one or multiple wounds []  - 0 Application of Medications - topical []  - 0 Application of Medications - injection INTERVENTIONS - Miscellaneous []  - External ear exam 0 []  - 0 Specimen Collection (cultures, biopsies, blood, body fluids, etc.) []  - 0 Specimen(s) / Culture(s) sent or taken to Lab for analysis []  - 0 Patient Transfer (multiple staff / Civil Service fast streamer / Similar devices) []  - 0 Simple Staple / Suture removal (25 or less) []  - 0 Complex Staple / Suture removal (26 or more) []  - 0 Hypo / Hyperglycemic Management (close monitor of Blood Glucose) []  - 0 Ankle / Brachial Index (ABI) - do not check if billed separately X- 1 5 Vital Signs Shutters, Lenzi S. ([id number]) Has the patient been seen at the hospital within the last three years: Yes Total Score: 110 Level Of Care: New/Established - Level 3 Electronic Signature(s) Signed: 10/24/2017 5:11:28 PM By: Gretta Cool, [name], RN, CWS, [name], [name] Entered By: Gretta Cool, [name], RN, CWS, Kim on 10/24/2017 14:20:42 Giuliano, [name] ([id number]) -------------------------------------------------------------------------------- Encounter Discharge Information Details Patient Name: [name], [name]. Date of Service: 10/24/2017 1:45 PM Medical Record Number: [id number] Patient Account Number: [id number] Date of Birth/Sex: 1966/10/10 (51 y.o. Female) Treating RN: Cornell Barman Primary Care Deija Buhrman: Delight Stare Other Clinician: Referring Cylan Borum: Delight Stare Treating Issam Carlyon/Extender: Frann Rider in Treatment: 27 Encounter Discharge Information Items Discharge Pain Level: 0 Discharge Condition: Stable Ambulatory Status: Cane Discharge Destination: Home Transportation: Private Auto Accompanied By: self Schedule Follow-up Appointment: Yes Medication Reconciliation completed and Yes provided to Patient/Care Oletta Buehring: Provided on Clinical Summary of Care: 10/24/2017 Form Type Recipient Paper Patient RM Electronic Signature(s) Signed: 10/29/2017 9:14:59 AM By: Ruthine Dose Entered By: Ruthine Dose on 10/24/2017 14:23:55 Bumpus, Jonah S. ([id number]) -------------------------------------------------------------------------------- Lower Extremity Assessment Details Patient Name: [name]. Date of Service: 10/24/2017 1:45 PM Medical Record Number: [id number] Patient Account Number: [id number] Date of Birth/Sex: [birth year] (51 y.o. Female) Treating RN: Cornell Barman Primary Care Jovontae Banko: Delight Stare Other Clinician: Referring Margerie Fraiser: Delight Stare Treating Laurita Peron/Extender: Frann Rider in Treatment: 39 Vascular Assessment Pulses: Dorsalis Pedis Palpable: [Right:Yes] Posterior Tibial Extremity colors, hair growth, and conditions: Extremity Color: [Right:Pale] Hair Growth on Extremity: [Right:No] Temperature of Extremity: [Right:Cool] Capillary Refill: [Right:< 3 seconds] Toe Nail Assessment Left: Right: Thick: Yes Discolored: Yes Deformed: Yes Improper Length and Hygiene: Yes Electronic Signature(s) Signed: 10/24/2017 5:11:28 PM By: Gretta Cool, [name], RN, CWS, [name], [name] Entered By: Gretta Cool, [name], RN, CWS, Kim on 10/24/2017 14:11:52 [place], [name] ([id number]) -------------------------------------------------------------------------------- [place] Details Patient Name: [name], [name]. Date of Service: 10/24/2017 1:45 PM Medical Record Number: [id number] Patient Account Number: [id number] Date of Birth/Sex: Nov 28, 1965 (51 y.o.  Female) Treating RN: Cornell Barman Primary Care Jyssica Rief: Delight Stare Other Clinician: Referring Sheronica Corey: Delight Stare Treating Jamarie Joplin/Extender: Frann Rider in Treatment: 1 Active Inactive Electronic Signature(s) Signed: 12/16/2017 1:59:41 PM By: Gretta Cool, [name], RN, CWS, [name], [name] Previous Signature: 10/24/2017 5:11:28 PM Version By: Gretta Cool, [name], RN, CWS, [name], [name] Entered By: Gretta Cool, [name], RN, CWS, Kim on 12/16/2017 13:59:40 [place]woude, [name] ([id number]) -------------------------------------------------------------------------------- Pain Assessment Details Patient Name: [name], [name]. Date of Service: 10/24/2017 1:45 PM Medical Record  Number: [id number] Patient Account Number: [id number] Date of Birth/Sex: [birth year] (51 y.o. Female) Treating RN: Cornell Barman Primary Care Marquise Lambson: Delight Stare Other Clinician: Referring Janiyah Beery: Delight Stare Treating Lillianne Eick/Extender: Frann Rider in Treatment: 39 Active Problems Location of Pain Severity and Description of Pain Patient Has Paino Yes Site Locations Pain Location: Generalized Pain With Dressing Change: Yes Rate the pain. Current Pain Level: 10 Pain Management and Medication Current Pain Management: Goals for Pain Management Topical or injectable lidocaine is offered to patient for acute pain when surgical debridement is performed. If needed, Patient is instructed to use over the counter pain medication for the following 24-48 hours after debridement. Wound care MDs do not prescribed pain medications. Patient has chronic pain or uncontrolled pain. Patient has been instructed to make an appointment with their Primary Care Physician for pain management. Electronic Signature(s) Signed: 10/24/2017 5:11:28 PM By: Gretta Cool, [name], RN, CWS, [name], [name] Entered By: Gretta Cool, [name], RN, CWS, Kim on 10/24/2017 14:03:05 [place], [name]  ([id number]) -------------------------------------------------------------------------------- Patient/Caregiver Education Details Patient Name: [name], [name]. Date of Service: 10/24/2017 1:45 PM Medical Record Number: [id number] Patient Account Number: [id number] Date of Birth/Gender: 12-22-1965 (51 y.o. Female) Treating RN: Cornell Barman Primary Care Physician: Delight Stare Other Clinician: Referring Physician: Delight Stare Treating Physician/Extender: Frann Rider in Treatment: 74 Education Assessment Education Provided To: Patient Education Topics Provided Wound/Skin Impairment: Handouts: Caring for Your Ulcer, Other: wound care as prescribed Methods: Demonstration Responses: State content correctly Electronic Signature(s) Signed: 10/24/2017 5:11:28 PM By: Gretta Cool, [name], RN, CWS, [name], [name] Entered By: Gretta Cool, [name], RN, CWS, Kim on 10/24/2017 14:21:45 Carl, [name] ([id number]) -------------------------------------------------------------------------------- Wound Assessment Details Patient Name: [name], [name]. Date of Service: 10/24/2017 1:45 PM Medical Record Number: [id number] Patient Account Number: [id number] Date of Birth/Sex: Jul 12, 1966 (51 y.o. Female) Treating RN: Cornell Barman Primary Care Joseph Johns: Delight Stare Other Clinician: Referring Karter Hellmer: Delight Stare Treating Zailah Zagami/Extender: Frann Rider in Treatment: 39 Wound Status Wound Number: 1 Primary Diabetic Wound/Ulcer of the Lower Extremity Etiology: Wound Location: Right Malleolus - Lateral Wound Open Wounding Event: Gradually Appeared Status: Date Acquired: 09/23/2016 Comorbid Anemia, Arrhythmia, Coronary Artery Disease, Weeks Of Treatment: 39 History: Hypotension, Peripheral Arterial Disease, Type Clustered Wound: No II Diabetes, Osteoarthritis Photos Photo Uploaded By: Gretta Cool, [name], RN, CWS, Kim on 10/24/2017 16:38:28 Wound Measurements Length: (cm) 1.2 Width: (cm) 1.2 Depth: (cm) 0.5 Area:  (cm) 1.131 Volume: (cm) 0.565 % Reduction in Area: 71.2% % Reduction in Volume: 71.2% Epithelialization: None Tunneling: No Undermining: No Wound Description Classification: Grade 2 Wound Margin: Distinct, outline attached Exudate Amount: Large Exudate Type: Sanguinous Exudate Color: red Foul Odor After Cleansing: No Slough/Fibrino Yes Wound Bed Granulation Amount: Medium (34-66%) Exposed Structure Granulation Quality: Pink Fascia Exposed: No Necrotic Amount: Medium (34-66%) Fat Layer (Subcutaneous Tissue) Exposed: Yes Necrotic Quality: Adherent Slough Tendon Exposed: No Muscle Exposed: No Joint Exposed: No Bone Exposed: No Periwound Skin Texture Texture Color No Abnormalities Noted: No No Abnormalities Noted: No Abella, Tykira S. ([id number]) Callus: No Atrophie Blanche: No Crepitus: No Cyanosis: No Excoriation: No Ecchymosis: No Induration: Yes Erythema: Yes Rash: No Erythema Location: Circumferential Scarring: No Hemosiderin Staining: No Mottled: No Moisture Pallor: No No Abnormalities Noted: No Rubor: No Dry / Scaly: No Maceration: Yes Temperature / Pain Temperature: No Abnormality Tenderness on Palpation: Yes Wound Preparation Ulcer Cleansing: Rinsed/Irrigated with Saline Topical Anesthetic Applied: Other: lidocaine 4%, Electronic Signature(s) Signed: 10/24/2017 5:11:28 PM By: Gretta Cool, [name], RN, CWS, [name], [name] Entered By: Gretta Cool, [name], RN,  CWS, Kim on 10/24/2017 14:09:30 Brummell, Lydiann S. ([id number]) -------------------------------------------------------------------------------- Wound Assessment Details Patient Name: [name], [name]. Date of Service: 10/24/2017 1:45 PM Medical Record Number: [id number] Patient Account Number: [id number] Date of Birth/Sex: 07-08-1966 (51 y.o. Female) Treating RN: Cornell Barman Primary Care Tylea Hise: Delight Stare Other Clinician: Referring Walt Geathers: Delight Stare Treating Vincy Feliz/Extender: Frann Rider in  Treatment: 39 Wound Status Wound Number: 2 Primary Open Surgical Wound Etiology: Wound Location: Right Gluteus Wound Open Wounding Event: Gradually Appeared Status: Date Acquired: 09/18/2016 Comorbid Anemia, Arrhythmia, Coronary Artery Disease, Weeks Of Treatment: 39 History: Hypotension, Peripheral Arterial Disease, Type Clustered Wound: No II Diabetes, Osteoarthritis Photos Photo Uploaded By: Gretta Cool, [name], RN, CWS, Kim on 10/24/2017 16:38:52 Wound Measurements Length: (cm) 2.1 Width: (cm) 1 Depth: (cm) 1.2 Area: (cm) 1.649 Volume: (cm) 1.979 % Reduction in Area: 91.4% % Reduction in Volume: 97.9% Epithelialization: None Wound Description Full Thickness Without Exposed Support Foul Odo Classification: Structures Due to P Wound Margin: Epibole Slough/F Exudate Large Amount: Exudate Type: Serous Exudate Color: amber r After Cleansing: Yes roduct Use: No ibrino Yes Wound Bed Granulation Amount: Medium (34-66%) Exposed Structure Granulation Quality: Red, Pink Fascia Exposed: No Necrotic Amount: Medium (34-66%) Fat Layer (Subcutaneous Tissue) Exposed: Yes Necrotic Quality: Adherent Slough Tendon Exposed: No Muscle Exposed: No Joint Exposed: No Bone Exposed: No Periwound Skin Texture Texture Color Gomes, Nuriyah S. ([id number]) No Abnormalities Noted: No No Abnormalities Noted: No Callus: No Atrophie Blanche: No Crepitus: No Cyanosis: No Excoriation: No Ecchymosis: Yes Induration: No Erythema: No Rash: No Hemosiderin Staining: No Scarring: No Mottled: No Pallor: No Moisture Rubor: No No Abnormalities Noted: No Dry / Scaly: No Temperature / Pain Maceration: No Temperature: No Abnormality Tenderness on Palpation: Yes Wound Preparation Ulcer Cleansing: Rinsed/Irrigated with Saline Topical Anesthetic Applied: Other: lidocaine 4%, Electronic Signature(s) Signed: 10/24/2017 5:11:28 PM By: Gretta Cool, [name], RN, CWS, [name], [name] Entered By: Gretta Cool, [name], RN,  CWS, Kim on 10/24/2017 14:10:54 Scioneaux, [name] ([id number]) -------------------------------------------------------------------------------- Vitals Details Patient Name: [name], [name]. Date of Service: 10/24/2017 1:45 PM Medical Record Number: [id number] Patient Account Number: [id number] Date of Birth/Sex: 07/20/1966 (51 y.o. Female) Treating RN: Cornell Barman Primary Care Xzayvier Fagin: Delight Stare Other Clinician: Referring Sweetie Giebler: Delight Stare Treating Mohmmad Saleeby/Extender: Frann Rider in Treatment: 39 Vital Signs Time Taken: 14:02 Temperature (F): 97.8 Height (in): 67 Pulse (bpm): 94 Weight (lbs): 137 Respiratory Rate (breaths/min): 16 Body Mass Index (BMI): 21.5 Blood Pressure (mmHg): 81/55 Reference Range: 80 - 120 mg / dl Electronic Signature(s) Signed: 10/24/2017 5:11:28 PM By: Gretta Cool, [name], RN, CWS, [name], [name] Entered By: Gretta Cool, [name], RN, CWS, Kim on 10/24/2017 14:04:40

## 2017-10-31 NOTE — Op Note (Addendum)
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  The patient was placed in supine position the Angio table.  Both groins were prepped and draped in usual sterile fashion.  Local anesthesia was infiltrated over the left common femoral artery.  Ultrasound was used to identify the left common femoral artery and femoral bifurcation as well as a pre-existing left femoral-popliteal bypass.  An introducer needle was then used to cannulate the left common femoral artery and an 035 versacore were threaded up in the abdominal aorta under fluoroscopic guidance.  A small nick was made in the skin in the left groin to assist with placing the sheath due to scar tissue.  A 5 French sheath was placed over the guidewire and the left common femoral artery.  This was thoroughly flushed with heparinized saline.  A 5 French PICC catheter was then advanced over the guidewire and these were advanced as a unit up into the ascending aorta.  An arch aortogram was then obtained in a 40 degree LAO projection.  This shows normal arch configuration.  The innominate is patent.  The origin of the right subclavian and right common carotid arteries are overlapped but appear patent.  The left common carotid origin is patent.  The left subclavian origin is occluded.  There is reconstitution of the distal left subclavian artery after about a 2 cm segment of  occlusion.  In order to remove overlap of the subclavian and common carotid artery a RAO 20 degrees caudal 10 degrees performed.  This shows a widely patent right subclavian and right common carotid artery.  At this point the pedal catheter was pulled down into the abdominal aorta.  Abdominal aortogram was then obtained in AP projection.  Left and right renal arteries are patent.  The infrarenal abdominal aorta is small but patent.  Right common and external iliac artery is occluded.  The left common and external iliac artery is patent.  The left internal iliac artery is occluded.  A 30 degree RAO view was then performed to keep overlying orthopedic hardware from obscuring the view.  This again confirmed the above findings.  There is also 50% stenosis of the infrarenal abdominal aorta about 2 cm above the aortic bifurcation.  The inferior mesenteric artery is widely patent and supplies abundant collaterals to the right and left iliac systems.  Next bilateral lower extremity runoff views were obtained through the pigtail catheter after pulling the pigtail down to just above the aortic bifurcation.  In the right lower extremity, the right common femoral artery is occluded.  There is a femoral-popliteal bypass which is patent and reconstitutes via collaterals.  There is three-vessel runoff to the right foot.  The posterior tibial artery is the main runoff vessel to the right foot.  In the left lower extremity, the left external iliac and common femoral artery is patent.  There is also a left femoral popliteal bypass which is patent with three-vessel runoff to the left foot with posterior tibial artery being the dominant  runoff vessel to the left foot.  At this point the pico catheter was removed over a guidewire.  The 5 French sheath was thoroughly flushed with heparinized saline.  The patient was taken to the holding area in stable condition.  Operative management: Dr. Trula Slade will review this patient's  images to determine whether or not she is a candidate for an inflow procedure to salvage her femoral-popliteal bypass grafts.  Ruta Hinds, MD [facility] Office: [phone] Pager: [phone]

## 2017-10-31 NOTE — Progress Notes (Addendum)
Site area: LFA Site Prior to Removal:  Level 0 Pressure Applied For:20 min Manual: yes   Patient Status During Pull:  stable Post Pull Site:  Level 0 Post Pull Instructions Given: yes  Post Pull Pulses Present: doppler Dressing Applied:  tegaderm Bedrest begins @ 7209 till 1315 Comments:

## 2017-11-17 ENCOUNTER — Ambulatory Visit: Payer: Medicare Other | Admitting: Neurology

## 2017-11-17 ENCOUNTER — Telehealth: Payer: Self-pay | Admitting: *Deleted

## 2017-11-17 NOTE — Telephone Encounter (Signed)
Patient called wanting to talk with Dr. Trula Slade re: results from [facility] 10/31/17. She also c/o decrease strength in LLE. Claims foot is cooler than right but denies any loss of color, or change. Claims pain is worse when leg is dependent. No MD in office today and instructed to go to the ER. She states she did not feel like she needed to go but said she would for any worsening condition. I told her I would message Dr. Trula Slade when he returns on 1/3 to call patient with report.

## 2017-11-19 ENCOUNTER — Encounter: Payer: Self-pay | Admitting: Infectious Disease

## 2017-11-19 ENCOUNTER — Ambulatory Visit (INDEPENDENT_AMBULATORY_CARE_PROVIDER_SITE_OTHER): Payer: Medicare Other | Admitting: Infectious Disease

## 2017-11-19 VITALS — BP 71/41 | HR 99 | Temp 97.8°F | Ht 67.0 in | Wt 158.0 lb

## 2017-11-19 DIAGNOSIS — Z794 Long term (current) use of insulin: Secondary | ICD-10-CM

## 2017-11-19 DIAGNOSIS — R059 Cough, unspecified: Secondary | ICD-10-CM

## 2017-11-19 DIAGNOSIS — R209 Unspecified disturbances of skin sensation: Secondary | ICD-10-CM

## 2017-11-19 DIAGNOSIS — E11628 Type 2 diabetes mellitus with other skin complications: Secondary | ICD-10-CM | POA: Diagnosis not present

## 2017-11-19 DIAGNOSIS — M86471 Chronic osteomyelitis with draining sinus, right ankle and foot: Secondary | ICD-10-CM | POA: Diagnosis not present

## 2017-11-19 DIAGNOSIS — M726 Necrotizing fasciitis: Secondary | ICD-10-CM | POA: Diagnosis not present

## 2017-11-19 DIAGNOSIS — R05 Cough: Secondary | ICD-10-CM | POA: Diagnosis not present

## 2017-11-19 DIAGNOSIS — I7092 Chronic total occlusion of artery of the extremities: Secondary | ICD-10-CM | POA: Diagnosis not present

## 2017-11-19 DIAGNOSIS — M79672 Pain in left foot: Secondary | ICD-10-CM

## 2017-11-19 MED ORDER — DOXYCYCLINE HYCLATE 100 MG PO TABS: 100.0000 mg | ORAL_TABLET | Freq: Two times a day (BID) | ORAL | 5 refills | Status: DC

## 2017-11-19 NOTE — Progress Notes (Signed)
Subjective:    Cc: followup for chronic wounds and osteo in ankle   Patient ID: [name], female    DOB: [birth year], 52 y.o.   MRN: [id number]  HPI  52 y.o. female with  diabetic neuropathy diabetic foot ulcer peripheral vascular disease status post bypass surgery with evidence of osteomyelitis on MRI in the [facility] in February of 2018. She also has a sacral wound that is not healed up completely where she had prior necrotizing fasciitis.I saw her in the hospital and she did not want to undergo surgical intervention. She tells me that Dr Sharol Given is concerned that if he performs at biopsy or debridement on this call it will lead to her losing her leg. I have indeed concerned that she mailed to my need a below the knee or above the knee and dictation to cure osteomyelitis at this site. I proposed the idea of her going on empiric IV antibiotics but she was not very eager to do that initially . Therefore I initially placed her  doxycycline twice daily which she has come off of now and also Augmentin. She had trouble tolerating this regimen with nausea and diarrhea. She ultimately decided to try IV abbx  We then placed her on IV vancomycin and ceftriaxone x 6 weeks. NOTE her vancomycin levels were very haphazard suggesting that she was not optimally or consistently compliant with her antibiotics.  She claimed that her wound on her foot was healing up somewhat. She ONCE AGAIN at that visit when I last saw her did  NOT want me to examine her sacral wound. This previously was where she had Nec fascitis but was not to bone--though wound care classifies as STAGE IV.  We  switched her to DS Bactrim 2 BID since finishing her abx.  Her ESR and CRP remain elevated at 76 and 2.8.  I again discussed that she would need amputation to cure her osteo in her fibula but that we could try to continue suppressive antibiotics.   She he has continued to follow with Dr. Con Memos with wound care   She is being  evaluated by Dr. Trula Slade for vascular surgery. He would be understandably concerned re risk of her ankle infection spreading to blood stream and to endovascular space as well as the challenge posed by location of her thigh wound.  When we checked plain films now are also showing osteo in the ankle. She had : #1 arch aortogram #2 abdominal aortogram with bilateral lower extremity runoff by Dr. Patience Musca on December 14th, 2018l.  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   She has noticed increasing pain in opposite foot and leg where foot goes cold and she says "I have poor blood flow"  She is also co sinus congestion and cough.   Past Medical History:  Diagnosis Date  . Arthritis   . Asthma   . Chronic back pain   . Depression   . Diabetes mellitus   . Diabetic neuropathy ([place])   . Family history of adverse reaction to anesthesia    mother had difficlty waking   .  GERD (gastroesophageal reflux disease)   . Hyperlipidemia   . Joint pain   . Leg pain    With Walking  . Osteomyelitis of right fibula ([place]) 03/05/2017  . PAD (peripheral artery disease) ([place])   . Reflux   . Ulcer    Foot    Past Surgical History:  Procedure Laterality Date  . ABDOMINAL AORTAGRAM  June 15, 2014  . ABDOMINAL AORTAGRAM N/A 06/15/2014   Procedure: ABDOMINAL Maxcine Ham;  Surgeon: Serafina Mitchell, MD;  Location: [facility] CATH LAB;  Service: Cardiovascular;  Laterality: N/A;  . ABDOMINAL AORTAGRAM N/A 11/22/2014   Procedure: ABDOMINAL AORTAGRAM;  Surgeon: Serafina Mitchell, MD;  Location: [facility] CATH LAB;  Service: Cardiovascular;  Laterality: N/A;  . ABDOMINAL AORTOGRAM W/LOWER EXTREMITY N/A 01/07/2017   Procedure: Abdominal Aortogram w/Lower Extremity;  Surgeon: Serafina Mitchell, MD;  Location: [place] CV LAB;  Service: Cardiovascular;  Laterality: N/A;  . ABDOMINAL AORTOGRAM W/LOWER EXTREMITY N/A 10/31/2017   Procedure: ABDOMINAL AORTOGRAM W/LOWER EXTREMITY;  Surgeon: Elam Dutch, MD;  Location: [place] CV LAB;  Service: Cardiovascular;  Laterality: N/A;  . AORTIC ARCH ANGIOGRAPHY N/A 10/31/2017   Procedure: AORTIC ARCH ANGIOGRAPHY;  Surgeon: Elam Dutch, MD;  Location: [place] CV LAB;  Service: Cardiovascular;  Laterality: N/A;  . ARTERIAL BYPASS SURGRY  07/05/2010   Right Common Femoral to below knee popliteal BPG  . BACK SURGERY     X's  2  . CARDIAC CATHETERIZATION    . CHOLECYSTECTOMY     Gall Bladder  . CYSTECTOMY Right    foot  . CYSTECTOMY Left    wrist  . INTERCOSTAL NERVE BLOCK  November 2015  . IR FLUORO GUIDE CV LINE RIGHT  03/20/2017  . IR US GUIDE VASC ACCESS RIGHT  03/20/2017  . IRRIGATION AND DEBRIDEMENT BUTTOCKS Right 09/30/2016   Procedure: DEBRIDEMENT RIGHT  BUTTOCK WOUND;  Surgeon: Georganna Skeans, MD;  Location: [place];  Service: General;  Laterality: Right;  . left foot surgery    . left wrist cyst removal Left   . PERIPHERAL VASCULAR CATHETERIZATION N/A 05/07/2016   Procedure: Abdominal Aortogram;  Surgeon: Serafina Mitchell, MD;  Location: [place] CV LAB;  Service: Cardiovascular;  Laterality: N/A;  . PERIPHERAL VASCULAR CATHETERIZATION N/A 05/07/2016   Procedure: Lower Extremity Angiography;  Surgeon: Serafina Mitchell, MD;  Location: [place] CV LAB;  Service: Cardiovascular;  Laterality: N/A;  . PERIPHERAL VASCULAR CATHETERIZATION N/A 05/07/2016   Procedure: Aortic Arch Angiography;  Surgeon: Serafina Mitchell, MD;  Location: [place] CV LAB;  Service: Cardiovascular;  Laterality: N/A;  . PERIPHERAL  VASCULAR CATHETERIZATION N/A 05/07/2016   Procedure: Upper Extremity Angiography;  Surgeon: Serafina Mitchell, MD;  Location: [place] CV LAB;  Service: Cardiovascular;  Laterality: N/A;  . PERIPHERAL VASCULAR CATHETERIZATION Right 05/07/2016   Procedure: Peripheral Vascular Balloon Angioplasty;  Surgeon: Serafina Mitchell, MD;  Location: [place] CV LAB;  Service: Cardiovascular;  Laterality: Right;  subclavian  . PERIPHERAL VASCULAR CATHETERIZATION Right 05/07/2016   Procedure: Peripheral Vascular Intervention;  Surgeon: Serafina Mitchell, MD;  Location: [place] CV LAB;  Service: Cardiovascular;  Laterality: Right;  External  Iliac  . SKIN GRAFT Right 2012   [name] by Dr. Nils Pyle- Right and Left Ankle  . SPINE SURGERY    . TONSILLECTOMY      Family History  Problem Relation Age of Onset  . Coronary artery disease Mother   . Peripheral vascular disease Mother   .  Heart disease Mother        Before age 17  . Other Mother        Venous insuffiency  . Diabetes Mother   . Hyperlipidemia Mother   . Hypertension Mother   . Varicose Veins Mother   . Heart attack Mother        before age 69  . Heart disease Father   . Diabetes Father   . Diabetes Maternal Grandmother   . Diabetes Paternal Grandmother   . Diabetes Paternal Grandfather   . Diabetes Sister   . Hypertension Sister   . Diabetes Brother   . Hypertension Brother       Social History   Socioeconomic History  . Marital status: Married    Spouse name: None  . Number of children: None  . Years of education: None  . Highest education level: None  Social Needs  . Financial resource strain: None  . Food insecurity - worry: None  . Food insecurity - inability: None  . Transportation needs - medical: None  . Transportation needs - non-medical: None  Occupational History  . Occupation: disabled  Tobacco Use  . Smoking status: Former Smoker    Years: 30.00    Types: Cigarettes    Last attempt to quit: 02/07/2017     Years since quitting: 0.7  . Smokeless tobacco: Never Used  Substance and Sexual Activity  . Alcohol use: No    Alcohol/week: 0.0 oz  . Drug use: No  . Sexual activity: Yes    Birth control/protection: Post-menopausal  Other Topics Concern  . None  Social History Narrative  . None    No Known Allergies   Current Outpatient Medications:  .  albuterol (PROVENTIL) 2 MG tablet, Take 2 mg by mouth 3 (three) times daily as needed for shortness of breath. , Disp: , Rfl:  .  aspirin 81 MG tablet, Take 81 mg by mouth daily. , Disp: , Rfl:  .  atorvastatin (LIPITOR) 40 MG tablet, Take 40 mg by mouth daily., Disp: , Rfl:  .  cetirizine (ZYRTEC) 10 MG tablet, Take 10 mg by mouth daily as needed for allergies. , Disp: , Rfl:  .  cyclobenzaprine (FLEXERIL) 10 MG tablet, Limit 1 tablet by mouth 1  to  3 times per day if tolerated (Patient taking differently: Take 10 mg by mouth 3 (three) times daily as needed for muscle spasms. ), Disp: 90 tablet, Rfl: 0 .  dicyclomine (BENTYL) 10 MG capsule, Take 10 mg by mouth 4 (four) times daily as needed for spasms., Disp: , Rfl:  .  furosemide (LASIX) 40 MG tablet, Take 40 mg by mouth 2 (two) times daily as needed for fluid. , Disp: , Rfl:  .  HUMALOG KWIKPEN 100 UNIT/ML KiwkPen, Inject 10 Units into the skin 3 (three) times daily. Per sliding scale, Disp: , Rfl:  .  HYDROcodone-acetaminophen (NORCO) 10-325 MG tablet, Limit 1 tablet by mouth per day or twice per day if tolerated (Patient taking differently: Take 1 tablet by mouth See admin instructions. Every 8-12 hours), Disp: 60 tablet, Rfl: 0 .  LANTUS SOLOSTAR 100 UNIT/ML Solostar Pen, Inject 36 Units into the skin 2 (two) times daily. , Disp: , Rfl:  .  LORazepam (ATIVAN) 0.5 MG tablet, Take 1 tablet 1 hour prior to PICC insertion.  May take 2nd pill at hospital if needed., Disp: 2 tablet, Rfl: 0 .  meclizine (ANTIVERT) 25 MG tablet, Take 25 mg by mouth 3 (  three) times daily as needed for dizziness., Disp: ,  Rfl:  .  midodrine (PROAMATINE) 10 MG tablet, Take 0.5 tablets (5 mg total) by mouth 3 (three) times daily as needed (lightheadedness and/or low blood pressure)., Disp: 90 tablet, Rfl: 0 .  omeprazole (PRILOSEC) 40 MG capsule, Take 40 mg by mouth daily., Disp: , Rfl:  .  ondansetron (ZOFRAN ODT) 4 MG disintegrating tablet, Take 1 tablet (4 mg total) by mouth every 8 (eight) hours as needed for nausea or vomiting., Disp: 20 tablet, Rfl: 0 .  pregabalin (LYRICA) 100 MG capsule, Limit 1 tab by mouth twice a day to 3 times a day if tolerated (Patient taking differently: Take 100 mg by mouth 3 (three) times daily. ), Disp: 90 capsule, Rfl: 0 .  pseudoephedrine-guaifenesin (MUCINEX D) 60-600 MG per tablet, Take 1 tablet by mouth every 12 (twelve) hours as needed for congestion. Reported on 04/08/2016, Disp: , Rfl:     Review of Systems  Constitutional: Negative for chills and fever.  HENT: Positive for postnasal drip. Negative for congestion and sore throat.   Eyes: Negative for photophobia.  Respiratory: Positive for cough. Negative for shortness of breath and wheezing.   Cardiovascular: Negative for chest pain, palpitations and leg swelling.  Gastrointestinal: Negative for abdominal pain, blood in stool, constipation, nausea and vomiting.  Genitourinary: Negative for dysuria, flank pain and hematuria.  Musculoskeletal: Positive for arthralgias. Negative for back pain.  Skin: Positive for color change and wound. Negative for rash.  Neurological: Negative for dizziness, weakness and headaches.  Hematological: Does not bruise/bleed easily.  Psychiatric/Behavioral: Negative for suicidal ideas.       Objective:   Physical Exam  Constitutional: She is oriented to person, place, and time. She appears well-developed and well-nourished. No distress.  HENT:  Head: Normocephalic and atraumatic.  Mouth/Throat: No oropharyngeal exudate.  Eyes: Conjunctivae and EOM are normal. No scleral icterus.  Neck:  Normal range of motion. Neck supple.  Cardiovascular: Normal rate and regular rhythm. Exam reveals no gallop and no friction rub.  No murmur heard. Pulmonary/Chest: Effort normal and breath sounds normal. No respiratory distress. She has no wheezes.  Abdominal: She exhibits no distension.  Musculoskeletal: She exhibits no edema or tenderness.  Neurological: She is alert and oriented to person, place, and time. She exhibits normal muscle tone. Coordination normal.  Skin: Skin is warm. She is not diaphoretic. There is erythema.  Psychiatric: She has a normal mood and affect. Her behavior is normal. Judgment and thought content normal.   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  We will use doxycyline though we nEVER had ANY pathogen isolated to [facility], new rx sent to her pharmacy  I did not recheck ESR or CRP this visit. She had recent labs in December   Chronic wound over her buttocks:  Continue to monitor, I dont feel like much progress is being made here either . Possibly slightly worse  Peripheral vascular disease post revascularization without simple options, surgery being planned  Dr. Trula Slade previously stated he is considering  right axillary to femoral bypass graft for limb salvage   New worsening left foot and leg pain: she should followup with Dr. Trula Slade re this. She has  Pain with walking. Foot is not overtly critically ischemic but her symptoms bother me and pulses are diminised. Photo taken and see above  New Cough: likley viral URI    Diabetes mellitus: Poorly controlled .  Marland Kitchen

## 2017-11-20 ENCOUNTER — Telehealth: Payer: Self-pay | Admitting: Surgery

## 2017-11-20 NOTE — Telephone Encounter (Signed)
-----   Message from Willy Eddy, RN sent at 11/20/2017  9:05 AM EST ----- Regarding: FW: Need another appt?   ----- Message ----- From: Serafina Mitchell, MD Sent: 11/20/2017   7:51 AM To: Willy Eddy, RN Subject: RE: Need another appt?                         Schedule office visit to discuss ----- Message ----- From: Willy Eddy, RN Sent: 11/17/2017   9:57 AM To: Serafina Mitchell, MD Subject: FW: Need another appt?                          Patient called 11/17/17 wanting to talk to you about results of 12/14 Aortogram. ----- Message ----- From: Mena Goes, RN Sent: 11/17/2017   9:56 AM To: Willy Eddy, RN Subject: Need another appt?                               ----- Message ----- From: Elam Dutch, MD Sent: 10/31/2017   8:30 AM To: Vvs Charge Pool  [place] groin Left femoral puncture Arch aortogram Abdominal aortogram with bilat runoff  Dr Trula Slade to review images and come up with plan  Ruta Hinds

## 2017-11-20 NOTE — Telephone Encounter (Signed)
Sched appt 11/24/17 at 3:15. Pt having symptoms, offered to send pt to triage, pt said they had spoken to triage yesterday and did not want to be transferred. Pt's husband will call back, he is in charge of the pt's doctor's appts.

## 2017-11-21 ENCOUNTER — Ambulatory Visit: Payer: Medicare Other | Admitting: Surgery

## 2017-11-24 ENCOUNTER — Ambulatory Visit: Payer: Medicare Other | Admitting: Surgery

## 2017-11-24 ENCOUNTER — Inpatient Hospital Stay (HOSPITAL_COMMUNITY)
Admission: AD | Admit: 2017-11-24 | Discharge: 2017-12-09 | DRG: 270 | Disposition: A | Discharge status: Home Health | Payer: Medicare Other | Source: Ambulatory Visit | Attending: Surgery | Admitting: Surgery

## 2017-11-24 ENCOUNTER — Encounter: Payer: Self-pay | Admitting: Surgery

## 2017-11-24 VITALS — BP 100/70 | HR 101 | Temp 97.3°F | Resp 20 | Ht 67.0 in | Wt 158.0 lb

## 2017-11-24 DIAGNOSIS — E1151 Type 2 diabetes mellitus with diabetic peripheral angiopathy without gangrene: Secondary | ICD-10-CM | POA: Diagnosis present

## 2017-11-24 DIAGNOSIS — R197 Diarrhea, unspecified: Secondary | ICD-10-CM | POA: Diagnosis not present

## 2017-11-24 DIAGNOSIS — D649 Anemia, unspecified: Secondary | ICD-10-CM | POA: Diagnosis not present

## 2017-11-24 DIAGNOSIS — E8770 Fluid overload, unspecified: Secondary | ICD-10-CM | POA: Diagnosis not present

## 2017-11-24 DIAGNOSIS — M1990 Unspecified osteoarthritis, unspecified site: Secondary | ICD-10-CM | POA: Diagnosis present

## 2017-11-24 DIAGNOSIS — D72829 Elevated white blood cell count, unspecified: Secondary | ICD-10-CM | POA: Diagnosis not present

## 2017-11-24 DIAGNOSIS — Z7901 Long term (current) use of anticoagulants: Secondary | ICD-10-CM

## 2017-11-24 DIAGNOSIS — Z87891 Personal history of nicotine dependence: Secondary | ICD-10-CM

## 2017-11-24 DIAGNOSIS — Z79899 Other long term (current) drug therapy: Secondary | ICD-10-CM

## 2017-11-24 DIAGNOSIS — E785 Hyperlipidemia, unspecified: Secondary | ICD-10-CM | POA: Diagnosis present

## 2017-11-24 DIAGNOSIS — I745 Embolism and thrombosis of iliac artery: Secondary | ICD-10-CM | POA: Diagnosis present

## 2017-11-24 DIAGNOSIS — Z01810 Encounter for preprocedural cardiovascular examination: Secondary | ICD-10-CM | POA: Diagnosis not present

## 2017-11-24 DIAGNOSIS — G9340 Encephalopathy, unspecified: Secondary | ICD-10-CM | POA: Diagnosis not present

## 2017-11-24 DIAGNOSIS — I499 Cardiac arrhythmia, unspecified: Secondary | ICD-10-CM | POA: Diagnosis not present

## 2017-11-24 DIAGNOSIS — J9621 Acute and chronic respiratory failure with hypoxia: Secondary | ICD-10-CM | POA: Diagnosis not present

## 2017-11-24 DIAGNOSIS — K219 Gastro-esophageal reflux disease without esophagitis: Secondary | ICD-10-CM | POA: Diagnosis present

## 2017-11-24 DIAGNOSIS — I2510 Atherosclerotic heart disease of native coronary artery without angina pectoris: Secondary | ICD-10-CM | POA: Diagnosis present

## 2017-11-24 DIAGNOSIS — I8290 Acute embolism and thrombosis of unspecified vein: Secondary | ICD-10-CM | POA: Diagnosis present

## 2017-11-24 DIAGNOSIS — I959 Hypotension, unspecified: Secondary | ICD-10-CM | POA: Diagnosis not present

## 2017-11-24 DIAGNOSIS — D62 Acute posthemorrhagic anemia: Secondary | ICD-10-CM | POA: Diagnosis not present

## 2017-11-24 DIAGNOSIS — J9691 Respiratory failure, unspecified with hypoxia: Secondary | ICD-10-CM | POA: Diagnosis present

## 2017-11-24 DIAGNOSIS — Y848 Other medical procedures as the cause of abnormal reaction of the patient, or of later complication, without mention of misadventure at the time of the procedure: Secondary | ICD-10-CM | POA: Diagnosis present

## 2017-11-24 DIAGNOSIS — I82B12 Acute embolism and thrombosis of left subclavian vein: Secondary | ICD-10-CM | POA: Diagnosis present

## 2017-11-24 DIAGNOSIS — M549 Dorsalgia, unspecified: Secondary | ICD-10-CM | POA: Diagnosis present

## 2017-11-24 DIAGNOSIS — I480 Paroxysmal atrial fibrillation: Secondary | ICD-10-CM | POA: Diagnosis not present

## 2017-11-24 DIAGNOSIS — R571 Hypovolemic shock: Secondary | ICD-10-CM | POA: Diagnosis not present

## 2017-11-24 DIAGNOSIS — I10 Essential (primary) hypertension: Secondary | ICD-10-CM | POA: Diagnosis present

## 2017-11-24 DIAGNOSIS — E1140 Type 2 diabetes mellitus with diabetic neuropathy, unspecified: Secondary | ICD-10-CM | POA: Diagnosis present

## 2017-11-24 DIAGNOSIS — I998 Other disorder of circulatory system: Secondary | ICD-10-CM | POA: Diagnosis present

## 2017-11-24 DIAGNOSIS — I708 Atherosclerosis of other arteries: Secondary | ICD-10-CM | POA: Diagnosis present

## 2017-11-24 DIAGNOSIS — I9589 Other hypotension: Secondary | ICD-10-CM | POA: Diagnosis present

## 2017-11-24 DIAGNOSIS — R579 Shock, unspecified: Secondary | ICD-10-CM | POA: Diagnosis not present

## 2017-11-24 DIAGNOSIS — I70213 Atherosclerosis of native arteries of extremities with intermittent claudication, bilateral legs: Secondary | ICD-10-CM | POA: Diagnosis not present

## 2017-11-24 DIAGNOSIS — M797 Fibromyalgia: Secondary | ICD-10-CM | POA: Diagnosis present

## 2017-11-24 DIAGNOSIS — T82898S Other specified complication of vascular prosthetic devices, implants and grafts, sequela: Secondary | ICD-10-CM | POA: Diagnosis not present

## 2017-11-24 DIAGNOSIS — J90 Pleural effusion, not elsewhere classified: Secondary | ICD-10-CM | POA: Diagnosis not present

## 2017-11-24 DIAGNOSIS — T82898A Other specified complication of vascular prosthetic devices, implants and grafts, initial encounter: Principal | ICD-10-CM | POA: Diagnosis present

## 2017-11-24 DIAGNOSIS — Z9889 Other specified postprocedural states: Secondary | ICD-10-CM

## 2017-11-24 DIAGNOSIS — M79605 Pain in left leg: Secondary | ICD-10-CM | POA: Diagnosis not present

## 2017-11-24 DIAGNOSIS — I9581 Postprocedural hypotension: Secondary | ICD-10-CM | POA: Diagnosis not present

## 2017-11-24 DIAGNOSIS — G8929 Other chronic pain: Secondary | ICD-10-CM | POA: Diagnosis present

## 2017-11-24 DIAGNOSIS — I493 Ventricular premature depolarization: Secondary | ICD-10-CM | POA: Diagnosis present

## 2017-11-24 DIAGNOSIS — Z794 Long term (current) use of insulin: Secondary | ICD-10-CM

## 2017-11-24 HISTORY — DX: Other specified complication of vascular prosthetic devices, implants and grafts, initial encounter: T82.898A

## 2017-11-24 HISTORY — DX: Paroxysmal atrial fibrillation: I48.0

## 2017-11-24 LAB — CBC
HCT: 38.9 % (ref 36.0–46.0)
Hemoglobin: 12 g/dL (ref 12.0–15.0)
MCH: 26.1 pg (ref 26.0–34.0)
MCHC: 30.8 g/dL (ref 30.0–36.0)
MCV: 84.6 fL (ref 78.0–100.0)
PLATELETS: 313 10*3/uL (ref 150–400)
RBC: 4.6 MIL/uL (ref 3.87–5.11)
RDW: 14 % (ref 11.5–15.5)
WBC: 14.4 10*3/uL — AB (ref 4.0–10.5)

## 2017-11-24 LAB — COMPREHENSIVE METABOLIC PANEL
ALBUMIN: 2.2 g/dL — AB (ref 3.5–5.0)
ALT: 14 U/L (ref 14–54)
AST: 40 U/L (ref 15–41)
Alkaline Phosphatase: 188 U/L — ABNORMAL HIGH (ref 38–126)
Anion gap: 10 (ref 5–15)
BUN: 21 mg/dL — AB (ref 6–20)
CO2: 16 mmol/L — ABNORMAL LOW (ref 22–32)
Calcium: 8.9 mg/dL (ref 8.9–10.3)
Chloride: 106 mmol/L (ref 101–111)
Creatinine, Ser: 0.84 mg/dL (ref 0.44–1.00)
GFR calc Af Amer: >60 mL/min (ref >60)
GFR calc non Af Amer: >60 mL/min (ref >60)
GLUCOSE: 119 mg/dL — AB (ref 65–99)
POTASSIUM: 5.9 mmol/L — AB (ref 3.5–5.1)
Sodium: 132 mmol/L — ABNORMAL LOW (ref 135–145)
TOTAL PROTEIN: 6.1 g/dL — AB (ref 6.5–8.1)
Total Bilirubin: 0.6 mg/dL (ref 0.3–1.2)

## 2017-11-24 LAB — HEMOGLOBIN A1C
HEMOGLOBIN A1C: 7.2 % — AB (ref 4.8–5.6)
MEAN PLASMA GLUCOSE: 159.94 mg/dL

## 2017-11-24 LAB — PROTIME-INR
INR: 1.18
PROTHROMBIN TIME: 14.9 s (ref 11.4–15.2)

## 2017-11-24 LAB — GLUCOSE, CAPILLARY: Glucose-Capillary: 151 mg/dL — ABNORMAL HIGH (ref 65–99)

## 2017-11-24 MED ORDER — FUROSEMIDE 40 MG PO TABS: 40.0000 mg | ORAL_TABLET | Freq: Two times a day (BID) | ORAL | Status: DC | PRN

## 2017-11-24 MED ORDER — HYDRALAZINE HCL 20 MG/ML IJ SOLN: 5.0000 mg | INTRAMUSCULAR | Status: DC | PRN

## 2017-11-24 MED ORDER — INSULIN LISPRO 100 UNIT/ML (KWIKPEN): 10.0000 [IU] | PEN_INJECTOR | Freq: Three times a day (TID) | SUBCUTANEOUS | Status: DC

## 2017-11-24 MED ORDER — MORPHINE SULFATE (PF) 2 MG/ML IV SOLN
1.0000 mg | INTRAVENOUS | Status: DC | PRN
  Administered 2017-11-25 (×4): 2 mg via INTRAVENOUS
  Filled 2017-11-24 (×4): qty 1

## 2017-11-24 MED ORDER — INSULIN ASPART 100 UNIT/ML ~~LOC~~ SOLN
0.0000 [IU] | Freq: Three times a day (TID) | SUBCUTANEOUS | Status: DC
  Administered 2017-11-25 (×2): 2 [IU] via SUBCUTANEOUS
  Administered 2017-11-26 – 2017-11-27 (×2): 1 [IU] via SUBCUTANEOUS
  Administered 2017-11-27: 2 [IU] via SUBCUTANEOUS

## 2017-11-24 MED ORDER — HEPARIN BOLUS VIA INFUSION
3500.0000 [IU] | Freq: Once | INTRAVENOUS | Status: AC
  Administered 2017-11-24: 3500 [IU] via INTRAVENOUS
  Filled 2017-11-24: qty 3500

## 2017-11-24 MED ORDER — MIDODRINE HCL 5 MG PO TABS: 5.0000 mg | ORAL_TABLET | Freq: Three times a day (TID) | ORAL | Status: DC | PRN

## 2017-11-24 MED ORDER — ACETAMINOPHEN 325 MG PO TABS
325.0000 mg | ORAL_TABLET | ORAL | Status: DC | PRN
  Administered 2017-11-26 – 2017-11-28 (×2): 650 mg via ORAL
  Filled 2017-11-24 (×2): qty 2

## 2017-11-24 MED ORDER — DICYCLOMINE HCL 10 MG PO CAPS
10.0000 mg | ORAL_CAPSULE | Freq: Four times a day (QID) | ORAL | Status: DC | PRN
  Filled 2017-11-24: qty 1

## 2017-11-24 MED ORDER — DIPHENHYDRAMINE HCL 50 MG/ML IJ SOLN: 12.5000 mg | Freq: Four times a day (QID) | INTRAMUSCULAR | Status: DC | PRN

## 2017-11-24 MED ORDER — ALUM & MAG HYDROXIDE-SIMETH 200-200-20 MG/5ML PO SUSP
15.0000 mL | ORAL | Status: DC | PRN
  Administered 2017-11-26: 30 mL via ORAL
  Filled 2017-11-24: qty 30

## 2017-11-24 MED ORDER — INSULIN GLARGINE 100 UNIT/ML ~~LOC~~ SOLN
36.0000 [IU] | Freq: Two times a day (BID) | SUBCUTANEOUS | Status: DC
  Administered 2017-11-25 – 2017-11-26 (×5): 36 [IU] via SUBCUTANEOUS
  Filled 2017-11-24 (×6): qty 0.36

## 2017-11-24 MED ORDER — SENNOSIDES-DOCUSATE SODIUM 8.6-50 MG PO TABS: 1.0000 | ORAL_TABLET | Freq: Every evening | ORAL | Status: DC | PRN

## 2017-11-24 MED ORDER — ACETAMINOPHEN 650 MG RE SUPP: 325.0000 mg | RECTAL | Status: DC | PRN

## 2017-11-24 MED ORDER — PSEUDOEPHEDRINE-GUAIFENESIN ER 60-600 MG PO TB12: 1.0000 | ORAL_TABLET | Freq: Two times a day (BID) | ORAL | Status: DC | PRN

## 2017-11-24 MED ORDER — OXYCODONE HCL 5 MG PO TABS
5.0000 mg | ORAL_TABLET | ORAL | Status: DC | PRN
  Administered 2017-11-24 – 2017-11-25 (×3): 10 mg via ORAL
  Administered 2017-11-26 – 2017-11-27 (×2): 5 mg via ORAL
  Administered 2017-11-28: 10 mg via ORAL
  Filled 2017-11-24 (×2): qty 1
  Filled 2017-11-24 (×3): qty 2
  Filled 2017-11-24: qty 1
  Filled 2017-11-24: qty 2

## 2017-11-24 MED ORDER — ONDANSETRON HCL 4 MG/2ML IJ SOLN: 4.0000 mg | Freq: Four times a day (QID) | INTRAMUSCULAR | Status: DC | PRN

## 2017-11-24 MED ORDER — GUAIFENESIN-DM 100-10 MG/5ML PO SYRP: 15.0000 mL | ORAL_SOLUTION | ORAL | Status: DC | PRN

## 2017-11-24 MED ORDER — DOCUSATE SODIUM 100 MG PO CAPS
100.0000 mg | ORAL_CAPSULE | Freq: Two times a day (BID) | ORAL | Status: DC
  Administered 2017-11-25 – 2017-11-27 (×4): 100 mg via ORAL
  Filled 2017-11-24 (×5): qty 1

## 2017-11-24 MED ORDER — ATORVASTATIN CALCIUM 40 MG PO TABS: 40.0000 mg | ORAL_TABLET | Freq: Every day | ORAL | Status: DC

## 2017-11-24 MED ORDER — LABETALOL HCL 5 MG/ML IV SOLN: 10.0000 mg | INTRAVENOUS | Status: DC | PRN

## 2017-11-24 MED ORDER — PREGABALIN 100 MG PO CAPS
100.0000 mg | ORAL_CAPSULE | Freq: Three times a day (TID) | ORAL | Status: DC
  Administered 2017-11-24 – 2017-11-27 (×10): 100 mg via ORAL
  Filled 2017-11-24 (×10): qty 2

## 2017-11-24 MED ORDER — CYCLOBENZAPRINE HCL 10 MG PO TABS: 10.0000 mg | ORAL_TABLET | Freq: Three times a day (TID) | ORAL | Status: DC | PRN

## 2017-11-24 MED ORDER — LORAZEPAM 0.5 MG PO TABS: 0.5000 mg | ORAL_TABLET | Freq: Every evening | ORAL | Status: DC | PRN

## 2017-11-24 MED ORDER — PANTOPRAZOLE SODIUM 40 MG PO TBEC
40.0000 mg | DELAYED_RELEASE_TABLET | Freq: Every day | ORAL | Status: DC
  Administered 2017-11-25 – 2017-11-27 (×3): 40 mg via ORAL
  Filled 2017-11-24 (×4): qty 1

## 2017-11-24 MED ORDER — NALOXONE HCL 0.4 MG/ML IJ SOLN: 0.4000 mg | INTRAMUSCULAR | Status: DC | PRN

## 2017-11-24 MED ORDER — POTASSIUM CHLORIDE CRYS ER 20 MEQ PO TBCR: 20.0000 meq | EXTENDED_RELEASE_TABLET | Freq: Once | ORAL | Status: DC

## 2017-11-24 MED ORDER — DIPHENHYDRAMINE HCL 12.5 MG/5ML PO ELIX: 12.5000 mg | ORAL_SOLUTION | Freq: Four times a day (QID) | ORAL | Status: DC | PRN

## 2017-11-24 MED ORDER — METOPROLOL TARTRATE 5 MG/5ML IV SOLN: 2.0000 mg | INTRAVENOUS | Status: DC | PRN

## 2017-11-24 MED ORDER — ALBUTEROL SULFATE 2 MG PO TABS
2.0000 mg | ORAL_TABLET | Freq: Three times a day (TID) | ORAL | Status: DC | PRN
  Filled 2017-11-24: qty 1

## 2017-11-24 MED ORDER — MORPHINE SULFATE 2 MG/ML IV SOLN
INTRAVENOUS | Status: DC
  Filled 2017-11-24: qty 30

## 2017-11-24 MED ORDER — ONDANSETRON 4 MG PO TBDP
4.0000 mg | ORAL_TABLET | Freq: Three times a day (TID) | ORAL | Status: DC | PRN
  Filled 2017-11-24: qty 1

## 2017-11-24 MED ORDER — BISACODYL 5 MG PO TBEC: 5.0000 mg | DELAYED_RELEASE_TABLET | Freq: Every day | ORAL | Status: DC | PRN

## 2017-11-24 MED ORDER — MECLIZINE HCL 25 MG PO TABS: 25.0000 mg | ORAL_TABLET | Freq: Three times a day (TID) | ORAL | Status: DC | PRN

## 2017-11-24 MED ORDER — ASPIRIN EC 81 MG PO TBEC
81.0000 mg | DELAYED_RELEASE_TABLET | Freq: Every day | ORAL | Status: DC
  Administered 2017-11-25 – 2017-11-27 (×3): 81 mg via ORAL
  Filled 2017-11-24 (×3): qty 1

## 2017-11-24 MED ORDER — SODIUM CHLORIDE 0.9 % IV SOLN: INTRAVENOUS | Status: DC

## 2017-11-24 MED ORDER — PHENOL 1.4 % MT LIQD: 1.0000 | OROMUCOSAL | Status: DC | PRN

## 2017-11-24 MED ORDER — SODIUM CHLORIDE 0.9% FLUSH
9.0000 mL | INTRAVENOUS | Status: DC | PRN
  Administered 2017-11-24: 9 mL via INTRAVENOUS
  Filled 2017-11-24: qty 9

## 2017-11-24 MED ORDER — HEPARIN (PORCINE) IN NACL 100-0.45 UNIT/ML-% IJ SOLN
1750.0000 [IU]/h | INTRAMUSCULAR | Status: DC
  Administered 2017-11-24: 1000 [IU]/h via INTRAVENOUS
  Administered 2017-11-25: 1250 [IU]/h via INTRAVENOUS
  Administered 2017-11-26 – 2017-11-27 (×2): 1300 [IU]/h via INTRAVENOUS
  Administered 2017-11-28: 1750 [IU]/h via INTRAVENOUS
  Filled 2017-11-24 (×5): qty 250

## 2017-11-24 MED ORDER — DOXYCYCLINE HYCLATE 100 MG PO TABS
100.0000 mg | ORAL_TABLET | Freq: Two times a day (BID) | ORAL | Status: DC
  Administered 2017-11-24 – 2017-11-27 (×7): 100 mg via ORAL
  Filled 2017-11-24 (×7): qty 1

## 2017-11-24 NOTE — Progress Notes (Signed)
[facility] [place]  Patient name: [name] MRN: [id number] DOB: February 09, 1966 Sex: female   REASON FOR VISIT:    Follow up  HISOTRY OF PRESENT ILLNESS:   [name] a 52 y.o.femalewho presents for follow-up.  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 She has had multiple interventions for recurrent stenosis. She has also undergone balloon angioplasty of her right subclavian artery for upper extremity numbness. 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 She has known osteomyelitis. She is on prolonged antibiotics and has a wound VAC in place. While in the hospital in February 2018 she had a wound on her right foot. 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 She is not a candidate for percutaneous intervention. She needs surgical revascularization but because of her infectious issues this has been delayed.  She continues to get follow-up with infectious disease and orthopedics for osteomyelitis in her fibula.  She is complaining of severe pain in her left leg following her arteriogram.   PAST MEDICAL HISTORY:   Past Medical History:  Diagnosis Date  . Arthritis   . Asthma   . Chronic back pain   . Depression   . Diabetes mellitus   . Diabetic neuropathy ([place])   . Family history of adverse reaction to anesthesia    mother had difficlty waking   . GERD (gastroesophageal reflux disease)   . Hyperlipidemia   . Joint pain   . Leg pain    With Walking  . Osteomyelitis of right fibula ([place]) 03/05/2017  . PAD (peripheral artery disease) ([place])   . Reflux   . Ulcer    Foot     FAMILY HISTORY:   Family History  Problem Relation Age of Onset  . Coronary artery disease Mother   . Peripheral vascular disease Mother   . Heart disease Mother        Before age 75  . Other Mother        Venous  insuffiency  . Diabetes Mother   . Hyperlipidemia Mother   . Hypertension Mother   . Varicose Veins Mother   . Heart attack Mother        before age 45  . Heart disease Father   . Diabetes Father   . Diabetes Maternal Grandmother   . Diabetes Paternal Grandmother   . Diabetes Paternal Grandfather   . Diabetes Sister   . Hypertension Sister   . Diabetes Brother   . Hypertension Brother     SOCIAL HISTORY:   Social History   Tobacco Use  . Smoking status: Former Smoker    Years: 30.00    Types: Cigarettes    Last attempt to quit: 02/07/2017    Years since quitting: 0.7  . Smokeless tobacco: Never Used  Substance Use Topics  . Alcohol use: No    Alcohol/week: 0.0 oz     ALLERGIES:   No Known Allergies   CURRENT MEDICATIONS:   Current Outpatient Medications  Medication Sig Dispense Refill  . albuterol (PROVENTIL) 2 MG tablet Take 2 mg by mouth 3 (three) times daily as needed for shortness of breath.     Marland Kitchen aspirin 81 MG tablet Take 81 mg by mouth daily.     Marland Kitchen atorvastatin (LIPITOR) 40 MG tablet Take 40 mg by mouth daily.    . cetirizine (ZYRTEC) 10 MG tablet Take 10 mg by mouth daily as needed  for allergies.     . cyclobenzaprine (FLEXERIL) 10 MG tablet Limit 1 tablet by mouth 1  to  3 times per day if tolerated (Patient taking differently: Take 10 mg by mouth 3 (three) times daily as needed for muscle spasms. ) 90 tablet 0  . doxycycline (VIBRA-TABS) 100 MG tablet Take 1 tablet (100 mg total) by mouth 2 (two) times daily. 60 tablet 5  . furosemide (LASIX) 40 MG tablet Take 40 mg by mouth 2 (two) times daily as needed for fluid.     Marland Kitchen HUMALOG KWIKPEN 100 UNIT/ML KiwkPen Inject 10 Units into the skin 3 (three) times daily. Per sliding scale    . HYDROcodone-acetaminophen (NORCO) 10-325 MG tablet Limit 1 tablet by mouth per day or twice per day if tolerated (Patient taking differently: Take 1 tablet by mouth See admin instructions. Every 8-12 hours) 60 tablet 0  . LANTUS  SOLOSTAR 100 UNIT/ML Solostar Pen Inject 36 Units into the skin 2 (two) times daily.     Marland Kitchen LORazepam (ATIVAN) 0.5 MG tablet Take 1 tablet 1 hour prior to PICC insertion.  May take 2nd pill at hospital if needed. 2 tablet 0  . meclizine (ANTIVERT) 25 MG tablet Take 25 mg by mouth 3 (three) times daily as needed for dizziness.    . midodrine (PROAMATINE) 10 MG tablet Take 0.5 tablets (5 mg total) by mouth 3 (three) times daily as needed (lightheadedness and/or low blood pressure). 90 tablet 0  . omeprazole (PRILOSEC) 40 MG capsule Take 40 mg by mouth daily.    . ondansetron (ZOFRAN ODT) 4 MG disintegrating tablet Take 1 tablet (4 mg total) by mouth every 8 (eight) hours as needed for nausea or vomiting. 20 tablet 0  . pregabalin (LYRICA) 100 MG capsule Limit 1 tab by mouth twice a day to 3 times a day if tolerated (Patient taking differently: Take 100 mg by mouth 3 (three) times daily. ) 90 capsule 0  . pseudoephedrine-guaifenesin (MUCINEX D) 60-600 MG per tablet Take 1 tablet by mouth every 12 (twelve) hours as needed for congestion. Reported on 04/08/2016    . dicyclomine (BENTYL) 10 MG capsule Take 10 mg by mouth 4 (four) times daily as needed for spasms.     No current facility-administered medications for this visit.     REVIEW OF SYSTEMS:   [X]  denotes positive finding, [ ]  denotes negative finding Cardiac  Comments:  Chest pain or chest pressure:    Shortness of breath upon exertion:    Short of breath when lying flat:    Irregular heart rhythm:        Vascular    Pain in calf, thigh, or hip brought on by ambulation: x   Pain in feet at night that wakes you up from your sleep:  x   Blood clot in your veins:    Leg swelling:  x       Pulmonary    Oxygen at home:    Productive cough:     Wheezing:         Neurologic    Sudden weakness in arms or legs:     Sudden numbness in arms or legs:     Sudden onset of difficulty speaking or slurred speech:    Temporary loss of vision in  one eye:     Problems with dizziness:         Gastrointestinal    Blood in stool:     Vomited blood:  Genitourinary    Burning when urinating:     Blood in urine:        Psychiatric    Major depression:         Hematologic    Bleeding problems:    Problems with blood clotting too easily:        Skin    Rashes or ulcers: x2       Constitutional    Fever or chills:      PHYSICAL EXAM:   Vitals:   11/24/17 1528  BP: 100/70  Pulse: (!) 101  Resp: 20  Temp: (!) 97.3 F (36.3 C)  TempSrc: Oral  SpO2: 96%  Weight: 158 lb (71.7 kg)  Height: 5\' 7"  (1.702 m)    GENERAL: The patient is a well-nourished female, in no acute distress. The vital signs are documented above. CARDIAC: There is a regular rate and rhythm.  VASCULAR: Left foot is cool.  No palpable pedal pulses bilaterally. PULMONARY: Non-labored respirations ABDOMEN: Soft and non-tender with normal pitched bowel sounds.  MUSCULOSKELETAL: There are no major deformities or cyanosis. NEUROLOGIC: No focal weakness or paresthesias are detected. SKIN: There are no ulcers or rashes noted. PSYCHIATRIC: The patient has a normal affect.  STUDIES:   None  MEDICAL ISSUES:   The patient will be admitted for IV pain control and heparinization as she has had what appears to be occlusion of her left femoral-popliteal bypass graft following aortogram.  I will need to get additional imaging, likely a CT angiogram versus a duplex to determine the etiology of her ischemia.  While she is in the hospital, I will consider revascularization either an aortobifemoral bypass graft or axillary femoral bypass graft.    [name], MD [facility] [phone] Pager [phone]

## 2017-11-24 NOTE — Progress Notes (Signed)
Dr. Darene Lamer. Early paged at 2230:   Patients pain 1/10 except with movement. Current has only received 10mg  oxy immediate release with adequate relief per patient. PCA has not yet been started due to lack of house wide updated equipment availability. SBP's low 100s. Paged Dr. Donnetta Hutching to request DC of PCA orders and initiation of PRN morphine for breakthrough pain. Also made Dr. Donnetta Hutching aware of absent dorsalis pedis pulse via doppler and weak posterior tibial pulse on the LLE.  Orders received: D/C morphine PCA order set PRN 1-2mg  IV morphine every 2 hours for breakthrough pain.

## 2017-11-24 NOTE — Progress Notes (Signed)
ANTICOAGULATION CONSULT NOTE - Initial Consult  Pharmacy Consult:  Heparin Indication:  Occluded bypass graft  No Known Allergies  Patient Measurements: Height = 67 inches Weight = 72 kg Heparin Dosing Weight: 71 kg  Vital Signs: Temp: 97.6 F (36.4 C) (01/07 1822) Temp Source: Oral (01/07 1822) BP: 98/67 (01/07 1822) Pulse Rate: 99 (01/07 1822)  Labs: No results for input(s): HGB, HCT, PLT, APTT, LABPROT, INR, HEPARINUNFRC, HEPRLOWMOCWT, CREATININE, CKTOTAL, CKMB, TROPONINI in the last 72 hours.  CrCl cannot be calculated (Patient's most recent lab result is older than the maximum 21 days allowed.).   Medical History: Past Medical History:  Diagnosis Date  . Arthritis   . Asthma   . Chronic back pain   . Depression   . Diabetes mellitus   . Diabetic neuropathy ([place])   . Family history of adverse reaction to anesthesia    mother had difficlty waking   . GERD (gastroesophageal reflux disease)   . Hyperlipidemia   . Joint pain   . Leg pain    With Walking  . 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 PAD (peripheral artery disease) ([place])   . Reflux   . Ulcer    Foot      Assessment: [name] presented for follow-up of bilateral fem-pop bypass grafts for ulcer disease.  MD suspects occluded graft and consults Pharmacy for heparin dosing.  Patient denies being on blood thinner at home.  Baseline labs pending collection (CBC trend is stable).   Goal of Therapy:  Heparin level 0.3-0.7 units/ml Monitor platelets by anticoagulation protocol: Yes    Plan:  Heparin 3500 units IV bolus x 1, then Heparin gtt at 1000 units/hr Check 6 hr heparin level Daily heparin level and CBC   Daila Elbert D. Mina Marble, PharmD, BCPS Pager:  [phone] 11/24/2017, 6:58 PM

## 2017-11-24 NOTE — H&P (Signed)
Please see clinic visit from today to serve as H&P  [name]

## 2017-11-24 NOTE — Progress Notes (Signed)
Pt admitted from home accompanied by her husband.  A/Ox4. C/O severe pain in BLE especially in LLE.   Idolina Primer, RN

## 2017-11-25 ENCOUNTER — Encounter (HOSPITAL_COMMUNITY): Payer: Self-pay

## 2017-11-25 ENCOUNTER — Inpatient Hospital Stay (HOSPITAL_COMMUNITY): Payer: Medicare Other

## 2017-11-25 ENCOUNTER — Other Ambulatory Visit: Payer: Self-pay

## 2017-11-25 DIAGNOSIS — Z01810 Encounter for preprocedural cardiovascular examination: Secondary | ICD-10-CM

## 2017-11-25 DIAGNOSIS — M79605 Pain in left leg: Secondary | ICD-10-CM

## 2017-11-25 LAB — URINALYSIS, ROUTINE W REFLEX MICROSCOPIC
Bilirubin Urine: NEGATIVE
Glucose, UA: NEGATIVE mg/dL
Ketones, ur: NEGATIVE mg/dL
LEUKOCYTES UA: NEGATIVE
Nitrite: NEGATIVE
PROTEIN: NEGATIVE mg/dL
SPECIFIC GRAVITY, URINE: 1.006 (ref 1.005–1.030)
pH: 5 (ref 5.0–8.0)

## 2017-11-25 LAB — HEPARIN LEVEL (UNFRACTIONATED)
HEPARIN UNFRACTIONATED: 0.15 [IU]/mL — AB (ref 0.30–0.70)
HEPARIN UNFRACTIONATED: 0.47 [IU]/mL (ref 0.30–0.70)
HEPARIN UNFRACTIONATED: 0.34 [IU]/mL (ref 0.30–0.70)

## 2017-11-25 LAB — GLUCOSE, CAPILLARY
GLUCOSE-CAPILLARY: 152 mg/dL — AB (ref 65–99)
GLUCOSE-CAPILLARY: 94 mg/dL (ref 65–99)
GLUCOSE-CAPILLARY: 166 mg/dL — AB (ref 65–99)
GLUCOSE-CAPILLARY: 130 mg/dL — AB (ref 65–99)

## 2017-11-25 LAB — CBC
HEMATOCRIT: 36.6 % (ref 36.0–46.0)
HEMOGLOBIN: 11.8 g/dL — AB (ref 12.0–15.0)
MCH: 27.3 pg (ref 26.0–34.0)
MCHC: 32.2 g/dL (ref 30.0–36.0)
MCV: 84.7 fL (ref 78.0–100.0)
Platelets: 258 10*3/uL (ref 150–400)
RBC: 4.32 MIL/uL (ref 3.87–5.11)
RDW: 14.4 % (ref 11.5–15.5)
WBC: 11.4 10*3/uL — ABNORMAL HIGH (ref 4.0–10.5)

## 2017-11-25 MED ORDER — HYDROMORPHONE 1 MG/ML IV SOLN
INTRAVENOUS | Status: DC
  Administered 2017-11-25: 1.3 mg via INTRAVENOUS
  Administered 2017-11-25: 0.4 mg via INTRAVENOUS
  Administered 2017-11-25: 14:00:00 via INTRAVENOUS
  Administered 2017-11-25: 0.2 mg via INTRAVENOUS
  Administered 2017-11-26: 1.4 mL via INTRAVENOUS
  Administered 2017-11-26: 0.8 mL via INTRAVENOUS
  Administered 2017-11-26: 2.2 mg via INTRAVENOUS
  Administered 2017-11-26: 5.3 mg via INTRAVENOUS
  Administered 2017-11-27: 1 mg via INTRAVENOUS
  Administered 2017-11-27: 0.6 mg via INTRAVENOUS
  Administered 2017-11-27: 2.2 mg via INTRAVENOUS
  Filled 2017-11-25: qty 25

## 2017-11-25 MED ORDER — ATORVASTATIN CALCIUM 80 MG PO TABS
80.0000 mg | ORAL_TABLET | Freq: Every day | ORAL | Status: DC
  Administered 2017-11-25 – 2017-11-27 (×3): 80 mg via ORAL
  Filled 2017-11-25 (×3): qty 1

## 2017-11-25 MED ORDER — DIPHENHYDRAMINE HCL 50 MG/ML IJ SOLN: 12.5000 mg | Freq: Four times a day (QID) | INTRAMUSCULAR | Status: DC | PRN

## 2017-11-25 MED ORDER — NALOXONE HCL 0.4 MG/ML IJ SOLN: 0.4000 mg | INTRAMUSCULAR | Status: DC | PRN

## 2017-11-25 MED ORDER — IOPAMIDOL (ISOVUE-370) INJECTION 76%
INTRAVENOUS | Status: AC
Start: 2017-11-25 — End: 2017-11-26
  Filled 2017-11-25: qty 100

## 2017-11-25 MED ORDER — SODIUM CHLORIDE 0.9% FLUSH: 9.0000 mL | INTRAVENOUS | Status: DC | PRN

## 2017-11-25 MED ORDER — DIPHENHYDRAMINE HCL 12.5 MG/5ML PO ELIX: 12.5000 mg | ORAL_SOLUTION | Freq: Four times a day (QID) | ORAL | Status: DC | PRN

## 2017-11-25 MED ORDER — ONDANSETRON HCL 4 MG/2ML IJ SOLN: 4.0000 mg | Freq: Four times a day (QID) | INTRAMUSCULAR | Status: DC | PRN

## 2017-11-25 MED ORDER — HEPARIN BOLUS VIA INFUSION
2000.0000 [IU] | Freq: Once | INTRAVENOUS | Status: AC
  Administered 2017-11-25: 2000 [IU] via INTRAVENOUS
  Filled 2017-11-25: qty 2000

## 2017-11-25 MED ORDER — AZITHROMYCIN 500 MG PO TABS
500.0000 mg | ORAL_TABLET | Freq: Every day | ORAL | Status: DC
  Administered 2017-11-25 – 2017-11-27 (×3): 500 mg via ORAL
  Filled 2017-11-25 (×3): qty 2

## 2017-11-25 NOTE — Progress Notes (Signed)
ANTICOAGULATION CONSULT NOTE  Pharmacy Consult:  Heparin Indication:  Occluded bypass graft  No Known Allergies  Patient Measurements: Height = 67 inches Weight = 72 kg Heparin Dosing Weight: 71 kg  Vital Signs: Temp: 98.1 F (36.7 C) (01/08 1423) Temp Source: Axillary (01/08 1423) BP: 95/76 (01/08 1423) Pulse Rate: 88 (01/08 1423)  Labs: Recent Labs    11/24/17 1852 11/25/17 0245 11/25/17 0939 11/25/17 1945  HGB 12.0 11.8*  --   --   HCT 38.9 36.6  --   --   PLT 313 258  --   --   LABPROT 14.9  --   --   --   INR 1.18  --   --   --   HEPARINUNFRC  --  0.15* 0.47 0.34  CREATININE 0.84  --   --   --     Estimated Creatinine Clearance: 75.3 mL/min (by C-G formula based on SCr of 0.84 mg/dL).     Assessment: 51 YOF presented for follow-up of bilateral fem-pop bypass grafts for ulcer disease.  MD suspects occluded graft and consults Pharmacy for heparin dosing.  Heparin level is therapeutic and has trended down; no bleeding reported.   Goal of Therapy:  Heparin level 0.3-0.7 units/ml Monitor platelets by anticoagulation protocol: Yes    Plan:  Increase heparin gtt slightly to 1300 units/hr F/U AM labs   Malcolm Quast D. Mina Marble, PharmD, BCPS Pager:  [phone] 11/25/2017, 8:27 PM

## 2017-11-25 NOTE — Consult Note (Signed)
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, full thickness wound remains Full thickness ulceration LE Pressure Injury POA: NA Measurement: Right malleolus: 1.7cm x 1.0cm x 0.4cm  Right buttock: 1.5cm x 2.0cm x 1.0cm  Wound bed: Right malleolus: yellow but not necrotic, pale, chronic in appearance  Right buttock: clean, pink, moist  Drainage (amount, consistency, odor)  Right malleolus: thick yellow drainage, no odor Right buttock: serosanguinous, no odor Periwound: somewhat macerated area around the right buttock wound Dressing procedure/placement/frequency: Clean both wounds with saline Pack both areas with silver hydrofiber Cover with foam dressings; change packing daily. Change topper dressing every 3 days.   Discussed POC with patient and bedside nurse.  Re consult if needed, will not follow at this time. Thanks  Mckynlee Luse [employer], RN,CWOCN, CNS, [place] [phone])

## 2017-11-25 NOTE — Progress Notes (Signed)
    Subjective  -   Still having pain in left leg   Physical Exam:  Both feet are without pedal pulses Abdomen is soft Respirations nonlabored       Assessment/Plan:    Pain control: I will start the patient on a PCA Continue heparin drip Sinus congestion: I will give the patient a Z-pack I am ordering a CT angiogram to define the patient's anatomy and to determine whether or not her left femoral-popliteal bypass graft has remained patent or occluded We will consult cardiology for operative clearance for possible aortobifemoral bypass with or without femoral-popliteal bypass.  [name] 11/25/2017 12:52 PM --  Vitals:   11/25/17 0512 11/25/17 0646  BP:  110/65  Pulse: 80 86  Resp: 14 15  Temp: 98.7 F (37.1 C)   SpO2: 95% 96%    Intake/Output Summary (Last 24 hours) at 11/25/2017 1252 Last data filed at 11/25/2017 1200 Gross per 24 hour  Intake 929.21 ml  Output 900 ml  Net 29.21 ml     Laboratory CBC    Component Value Date/Time   WBC 11.4 (H) 11/25/2017 0245   HGB 11.8 (L) 11/25/2017 0245   HCT 36.6 11/25/2017 0245   PLT 258 11/25/2017 0245    BMET    Component Value Date/Time   NA 132 (L) 11/24/2017 1852   K 5.9 (H) 11/24/2017 1852   CL 106 11/24/2017 1852   CO2 16 (L) 11/24/2017 1852   GLUCOSE 119 (H) 11/24/2017 1852   BUN 21 (H) 11/24/2017 1852   CREATININE 0.84 11/24/2017 1852   CREATININE 1.24 (H) 08/12/2017 1530   CALCIUM 8.9 11/24/2017 1852   GFRNONAA >60 11/24/2017 1852   GFRNONAA 50 (L) 08/12/2017 1530   GFRAA >60 11/24/2017 1852   GFRAA 57 (L) 08/12/2017 1530    COAG Lab Results  Component Value Date   INR 1.18 11/24/2017   INR 1.04 12/31/2016   INR 1.06 12/23/2016   No results found for: PTT  Antibiotics Anti-infectives (From admission, onward)   Start     Dose/Rate Route Frequency Ordered Stop   11/24/17 2200  doxycycline (VIBRA-TABS) tablet 100 mg     100 mg Oral 2 times daily 11/24/17 1807         V. Leia Alf, M.D. [facility] Office: [phone] Pager:  [phone]

## 2017-11-25 NOTE — Progress Notes (Signed)
ANTICOAGULATION CONSULT NOTE - Follow Up Consult  Pharmacy Consult for Heparin Indication: occluded fem-pop bypass  No Known Allergies  Patient Measurements: Height: 5\' 7"  (170.2 cm) Weight: 162 lb 0.6 oz (73.5 kg) IBW/kg (Calculated) : 61.6 Heparin Dosing Weight: 73.5 kg  Vital Signs: Temp: 98.7 F (37.1 C) (01/08 0512) Temp Source: Oral (01/08 0512) BP: 110/65 (01/08 0646) Pulse Rate: 86 (01/08 0646)  Labs: Recent Labs    11/24/17 1852 11/25/17 0245 11/25/17 0939  HGB 12.0 11.8*  --   HCT 38.9 36.6  --   PLT 313 258  --   LABPROT 14.9  --   --   INR 1.18  --   --   HEPARINUNFRC  --  0.15* 0.47  CREATININE 0.84  --   --     Estimated Creatinine Clearance: 75.3 mL/min (by C-G formula based on SCr of 0.84 mg/dL).   Medications:  Scheduled:  . aspirin EC  81 mg Oral Daily  . atorvastatin  80 mg Oral q1800  . docusate sodium  100 mg Oral BID  . doxycycline  100 mg Oral BID  . insulin aspart  0-9 Units Subcutaneous TID WC  . insulin glargine  36 Units Subcutaneous BID  . pantoprazole  40 mg Oral Daily  . pregabalin  100 mg Oral TID   Infusions:  . sodium chloride Stopped (11/24/17 2153)  . 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  VVS reviewing for possible surgical intervention.  For now, patient is therapeutic on heparin at 1250 units/hr.  Goal of Therapy:  Heparin level 0.3-0.7 units/ml Monitor platelets by anticoagulation protocol: Yes   Plan:  Continue heparin at 1250 units/hr. Check heparin level at 1600 for confirmation. Daily heparin level and CBC Follow-up plans for surgical intervention.  [employer], Pharm.D., BCPS Clinical Pharmacist Pager: [phone] Clinical phone for 11/25/2017 from 8:30-4:00 is [phone]. After 4pm, please call Main Rx [phone]) for assistance. 11/25/2017 11:45 AM

## 2017-11-25 NOTE — Consult Note (Signed)
Cardiology Consultation:   Patient ID: BRYNLEA SPINDLER; [id number]; 1966/05/01   Admit date: 11/24/2017 Date of Consult: 11/25/2017  Primary Care Provider: [name], MD Primary Cardiologist: Nelva Bush, MD  Primary Electrophysiologist:  N/A   Patient Profile:   CHERI AYOTTE is a 52 y.o. female with a hx of HTN, HLD, DM II, significant PAD who is being seen today for the evaluation of preop clearance at the request of Dr. Trula Slade.  History of Present Illness:   Ms. Eversley with PMH of HTN, HLD, DM II, significant PAD with recurrent restenosis of LE bypass graft and unhealing wound.  Patient was previously followed by Dr. Farrel Conners, later to establish with Dr. Saunders Revel.  Myoview obtained on 03/12/2017 showed EF 43%, large regional fixed perfusion defect in the basal to mid inferior wall, inferolateral wall and distal wall concerning for previous MI, hypokinesis noted in the basal to mid inferior wall.  However follow-up echocardiogram obtained on 04/17/2017 showed EF 55-60%, mild LVH, normal RVEF.  The patient has been dealing with recurrent restenosis of lower extremity bilateral femoropopliteal bypass graft.  She also was hospitalized previously for necrotizing fasciitis of the sacral ulcer.  She is on prolonged antibiotics.  She also has difficult to heal wound in her right lower extremity as well.  She was last seen by Dr. Saunders Revel in August 2018, review of the previous Myoview and a subsequent echocardiogram suggested possible attenuation artifact.  Given the potential need for upcoming surgery, Dr. Saunders Revel felt additional ischemic workup will likely not lower her overall cardiac risk any further.  She recently underwent aortogram by 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   Unfortunately patient presented yesterday to [facility] with significant left lower extremity pain.  Symptom is consistent with occlusion of her left femoropopliteal bypass graft following aortogram.  Patient was also complaining of significant pain in the left lower extremity.  Her left lower extremity is a lot colder than the the right lower extremity.  Dr. Trula Slade recommended surgical intervention.  Cardiology was consulted for preoperative clearance.  According to the patient, she has been having difficulty ambulating for several years due to lower back issues.  However despite this she continued to try to exercise with physical therapy.  She says prior to the recent aortogram, she was able to climb up at least 2 flight of stairs.  She is also able to walk about 200-300 feet without any issue.  She denies any exertional chest pain or shortness of breath.   Past Medical History:  Diagnosis Date  . Arthritis   . Asthma   . Chronic back pain   . Depression   . Diabetes mellitus   . Diabetic neuropathy ([place])   . Family history of adverse reaction to anesthesia    mother had difficlty waking   . GERD (gastroesophageal reflux disease)   . Hyperlipidemia   . Joint pain   . Leg pain    With Walking  . 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 PAD (peripheral artery disease) ([place])   . Reflux   . Ulcer    Foot    Past Surgical History:  Procedure Laterality Date  . ABDOMINAL AORTAGRAM  June 15, 2014  . ABDOMINAL AORTAGRAM N/A 06/15/2014   Procedure: ABDOMINAL Maxcine Ham;  Surgeon: Durene Fruits  Pierre Bali, MD;  Location: [place] CATH LAB;  Service: Cardiovascular;  Laterality: N/A;  . ABDOMINAL AORTAGRAM N/A 11/22/2014   Procedure: ABDOMINAL AORTAGRAM;  Surgeon: Serafina Mitchell, MD;  Location: [facility] CATH LAB;  Service: Cardiovascular;  Laterality: N/A;  . ABDOMINAL AORTOGRAM W/LOWER EXTREMITY N/A 01/07/2017   Procedure: Abdominal Aortogram w/Lower Extremity;  Surgeon: Serafina Mitchell, MD;   Location: [place] CV LAB;  Service: Cardiovascular;  Laterality: N/A;  . ABDOMINAL AORTOGRAM W/LOWER EXTREMITY N/A 10/31/2017   Procedure: ABDOMINAL AORTOGRAM W/LOWER EXTREMITY;  Surgeon: Elam Dutch, MD;  Location: [place] CV LAB;  Service: Cardiovascular;  Laterality: N/A;  . AORTIC ARCH ANGIOGRAPHY N/A 10/31/2017   Procedure: AORTIC ARCH ANGIOGRAPHY;  Surgeon: Elam Dutch, MD;  Location: [place] CV LAB;  Service: Cardiovascular;  Laterality: N/A;  . ARTERIAL BYPASS SURGRY  07/05/2010   Right Common Femoral to below knee popliteal BPG  . BACK SURGERY     X's  2  . CARDIAC CATHETERIZATION    . CHOLECYSTECTOMY     Gall Bladder  . CYSTECTOMY Right    foot  . CYSTECTOMY Left    wrist  . INTERCOSTAL NERVE BLOCK  November 2015  . IR FLUORO GUIDE CV LINE RIGHT  03/20/2017  . IR US GUIDE VASC ACCESS RIGHT  03/20/2017  . IRRIGATION AND DEBRIDEMENT BUTTOCKS Right 09/30/2016   Procedure: DEBRIDEMENT RIGHT  BUTTOCK WOUND;  Surgeon: Georganna Skeans, MD;  Location: [place];  Service: General;  Laterality: Right;  . left foot surgery    . left wrist cyst removal Left   . PERIPHERAL VASCULAR CATHETERIZATION N/A 05/07/2016   Procedure: Abdominal Aortogram;  Surgeon: Serafina Mitchell, MD;  Location: [place] CV LAB;  Service: Cardiovascular;  Laterality: N/A;  . PERIPHERAL VASCULAR CATHETERIZATION N/A 05/07/2016   Procedure: Lower Extremity Angiography;  Surgeon: Serafina Mitchell, MD;  Location: [place] CV LAB;  Service: Cardiovascular;  Laterality: N/A;  . PERIPHERAL VASCULAR CATHETERIZATION N/A 05/07/2016   Procedure: Aortic Arch Angiography;  Surgeon: Serafina Mitchell, MD;  Location: [place] CV LAB;  Service: Cardiovascular;  Laterality: N/A;  . PERIPHERAL VASCULAR CATHETERIZATION N/A 05/07/2016   Procedure: Upper Extremity Angiography;  Surgeon: Serafina Mitchell, MD;  Location: [place] CV LAB;  Service: Cardiovascular;  Laterality: N/A;  . PERIPHERAL VASCULAR CATHETERIZATION  Right 05/07/2016   Procedure: Peripheral Vascular Balloon Angioplasty;  Surgeon: Serafina Mitchell, MD;  Location: [place] CV LAB;  Service: Cardiovascular;  Laterality: Right;  subclavian  . PERIPHERAL VASCULAR CATHETERIZATION Right 05/07/2016   Procedure: Peripheral Vascular Intervention;  Surgeon: Serafina Mitchell, MD;  Location: [place] CV LAB;  Service: Cardiovascular;  Laterality: Right;  External  Iliac  . SKIN GRAFT Right 2012   [name] by Dr. Nils Pyle- Right and Left Ankle  . SPINE SURGERY    . TONSILLECTOMY       Home Medications:  Prior to Admission medications   Medication Sig Start Date End Date Taking? Authorizing Provider  albuterol (PROVENTIL) 2 MG tablet Take 2 mg by mouth 3 (three) times daily as needed for shortness of breath.    Yes [provider]  cetirizine (ZYRTEC) 10 MG tablet Take 10 mg by mouth daily as needed for allergies.    Yes [provider]  dicyclomine (BENTYL) 10 MG capsule Take 10 mg by mouth 4 (four) times daily as needed for spasms. 08/08/16 11/24/17 Yes [provider]  doxycycline (VIBRA-TABS) 100 MG tablet Take 1 tablet (  100 mg total) by mouth 2 (two) times daily. 11/19/17  Yes [name], Lavell Islam, MD  HUMALOG KWIKPEN 100 UNIT/ML KiwkPen Inject 10 Units into the skin See admin instructions. 10 units three times a day before meals per sliding scale 11/11/15  Yes [provider]  HYDROcodone-acetaminophen (NORCO) 10-325 MG tablet Limit 1 tablet by mouth per day or twice per day if tolerated Patient taking differently: Take 1-1.5 tablets by mouth 2 (two) times daily.  07/18/16  Yes Mohammed Kindle, MD  LANTUS SOLOSTAR 100 UNIT/ML Solostar Pen Inject 36 Units into the skin 2 (two) times daily.  09/15/14  Yes [provider]  ondansetron (ZOFRAN ODT) 4 MG disintegrating tablet Take 1 tablet (4 mg total) by mouth every 8 (eight) hours as needed for nausea or vomiting. 05/05/17  Yes Kuppelweiser, Cassie L, RPH-CPP    pregabalin (LYRICA) 100 MG capsule Limit 1 tab by mouth twice a day to 3 times a day if tolerated Patient taking differently: Take 100 mg by mouth 3 (three) times daily.  07/18/16  Yes Mohammed Kindle, MD  omeprazole (PRILOSEC) 40 MG capsule Take 40 mg by mouth daily. 11/06/16   [provider]    Inpatient Medications: Scheduled Meds: . aspirin EC  81 mg Oral Daily  . docusate sodium  100 mg Oral BID  . doxycycline  100 mg Oral BID  . insulin aspart  0-9 Units Subcutaneous TID WC  . insulin glargine  36 Units Subcutaneous BID  . pantoprazole  40 mg Oral Daily  . pregabalin  100 mg Oral TID   Continuous Infusions: . sodium chloride Stopped (11/24/17 2153)  . heparin 1,250 Units/hr (11/25/17 0339)   PRN Meds: acetaminophen **OR** acetaminophen, albuterol, alum & mag hydroxide-simeth, bisacodyl, dicyclomine, furosemide, guaiFENesin-dextromethorphan, hydrALAZINE, labetalol, metoprolol tartrate, morphine injection, ondansetron, oxyCODONE, phenol, senna-docusate  Allergies:   No Known Allergies  Social History:   Social History   Socioeconomic History  . Marital status: Married    Spouse name: Not on file  . Number of children: Not on file  . Years of education: Not on file  . Highest education level: Not on file  Social Needs  . Financial resource strain: Not on file  . Food insecurity - worry: Not on file  . Food insecurity - inability: Not on file  . Transportation needs - medical: Not on file  . Transportation needs - non-medical: Not on file  Occupational History  . Occupation: disabled  Tobacco Use  . Smoking status: Former Smoker    Years: 30.00    Types: Cigarettes    Last attempt to quit: 02/07/2017    Years since quitting: 0.7  . Smokeless tobacco: Never Used  Substance and Sexual Activity  . Alcohol use: No    Alcohol/week: 0.0 oz  . Drug use: No  . Sexual activity: Yes    Birth control/protection: Post-menopausal  Other Topics Concern  . Not on  file  Social History Narrative  . Not on file    Family History:    Family History  Problem Relation Age of Onset  . Coronary artery disease Mother   . Peripheral vascular disease Mother   . Heart disease Mother        Before age 62  . Other Mother        Venous insuffiency  . Diabetes Mother   . Hyperlipidemia Mother   . Hypertension Mother   . Varicose Veins Mother   . Heart attack Mother  before age 56  . Heart disease Father   . Diabetes Father   . Diabetes Maternal Grandmother   . Diabetes Paternal Grandmother   . Diabetes Paternal Grandfather   . Diabetes Sister   . Hypertension Sister   . Diabetes Brother   . Hypertension Brother      ROS:  Please see the history of present illness.  ROS  All other ROS reviewed and negative.     Physical Exam/Data:   Vitals:   11/25/17 0146 11/25/17 0346 11/25/17 0512 11/25/17 0646  BP: 111/80 114/66  110/65  Pulse:   80 86  Resp: 14 16 14 15   Temp:   98.7 F (37.1 C)   TempSrc:   Oral   SpO2:   95% 96%  Weight:   162 lb 0.6 oz (73.5 kg)   Height:        Intake/Output Summary (Last 24 hours) at 11/25/2017 1007 Last data filed at 11/25/2017 0600 Gross per 24 hour  Intake 449.21 ml  Output 900 ml  Net -450.79 ml   Filed Weights   11/24/17 2325 11/25/17 0512  Weight: 158 lb 1.1 oz (71.7 kg) 162 lb 0.6 oz (73.5 kg)   Body mass index is 25.38 kg/m.  General:  Well nourished, well developed, in no acute distress HEENT: normal Lymph: no adenopathy Neck: no JVD Endocrine:  No thryomegaly Vascular: LLE cold, [name] warm, difficult to feel DP and PD pulses Cardiac:  normal S1, S2; RRR; no murmur  Lungs:  clear to auscultation bilaterally, no wheezing, rhonchi or rales  Abd: soft, nontender, no hepatomegaly  Ext: no edema Musculoskeletal:  No deformities, BUE and BLE strength normal and equal Skin: warm and dry  Neuro:  CNs 2-12 intact, no focal abnormalities noted Psych:  Normal affect   EKG:  The EKG was  personally reviewed and demonstrates:  Pending EKG Telemetry:  Telemetry was personally reviewed and demonstrates:  NSR without significant ventricular ectopy  Relevant CV Studies:  Myoview 03/12/2017 Pharmacological myocardial perfusion imaging study with no significant  Ischemia Large region of fixed perfusion defect in the basal to mid inferior wall, inferior lateral mid to distal wall concerning for previous MI  Hypokinesis noted in the basal to mid inferior wall,  EF estimated at 43% No EKG changes concerning for ischemia at peak stress or in recovery. Moderate to high risk and given size of perfusion defect detailed above   Echo 04/17/2017 LV EF: 55% -   60%  Study Conclusions  - Technically difficult study due to chest wall and/or lung   interference. - Left ventricle: The cavity size was normal. Wall thickness was   increased in a pattern of mild LVH. Systolic function was normal.   The estimated ejection fraction was in the range of 55% to 60%.   The study is not technically sufficient to allow evaluation of LV   diastolic function. - Mitral valve: Calcified annulus. Mildly thickened leaflets . - Right ventricle: The cavity size was normal. Systolic function   was normal. - Pericardium, extracardiac: A trivial pericardial effusion was   identified.  Laboratory Data:  Chemistry Recent Labs  Lab 11/24/17 1852  NA 132*  K 5.9*  CL 106  CO2 16*  GLUCOSE 119*  BUN 21*  CREATININE 0.84  CALCIUM 8.9  GFRNONAA >60  GFRAA >60  ANIONGAP 10    Recent Labs  Lab 11/24/17 1852  PROT 6.1*  ALBUMIN 2.2*  AST 40  ALT 14  ALKPHOS  188*  BILITOT 0.6   Hematology Recent Labs  Lab 11/24/17 1852 11/25/17 0245  WBC 14.4* 11.4*  RBC 4.60 4.32  HGB 12.0 11.8*  HCT 38.9 36.6  MCV 84.6 84.7  MCH 26.1 27.3  MCHC 30.8 32.2  RDW 14.0 14.4  PLT 313 258   Cardiac EnzymesNo results for input(s): TROPONINI in the last 168 hours. No results for input(s): TROPIPOC in the  last 168 hours.  BNPNo results for input(s): BNP, PROBNP in the last 168 hours.  DDimer No results for input(s): DDIMER in the last 168 hours.  Radiology/Studies:  No results found.  Assessment and Plan:   1. Preoperative clearance: Vascular surgery recommended either aortobifemoral bypass revascularization or axillary femoral bypass graft.  This patient would be a moderate risk patient for high risk procedure.  Most of her critical risk is likely due to risk of infection instead of cardiac issues.  Given lack of chest discomfort or change of baseline shortness of breath, I would not recommend any further workup.  See additional comment by MD.  - RCRI risk class II, 6.6% risk of major cardiac event  2. PAD: Recently underwent aortogram by Dr. Eden Lathe on 10/31/2017, at that time aortobifemoral bypass graft was still patent, unfortunately she had acute thrombosis of the left lower extremity aortobifemoral bypass graft.  Given her young age, long-term functional ability and body of life depend on upcoming surgery.  3. Hypertension: due to recently known L subclavian stenosis, recommend only check BP on the right side.   4. Hyperlipidemia: Last lipid panel in March 2018 showed total cholesterol 213, triglycerides 310, HDL 32, LDL 119.  She has significant metabolic disease, and LDL goal is less than 70. Not on statin, recommend lipid clinic. Start on high dose statin.   5. DM 2: On insulin at home   For questions or updates, please contact [place] Please consult [email or web address] for contact info under Cardiology/STEMI.   Hilbert Corrigan, [place]  11/25/2017 10:07 AM  As above, patient seen and examined.  Briefly she is a 52 year old female with past medical history diabetes mellitus, hypertension, hyperlipidemia, peripheral vascular disease for preoperative evaluation prior to axillary femoral bypass.  Patient did have a nuclear study performed April 2018.  This showed an ejection fraction of 43%  and an inferior/inferolateral fixed defect suggestive of prior infarct but no ischemia.  However follow-up echocardiogram in May 2018 showed normal LV function and mild left ventricular hypertrophy.  Patient does have limited mobility but typically denies dyspnea on exertion, orthopnea, PND, pedal edema, exertional chest pain or syncope.  Patient presented with left lower extremity pain and there is concern of occlusion of her left femoral-popliteal bypass graft.  Patient will require surgical intervention and cardiology asked to evaluate.  1 preoperative evaluation prior to vascular surgery-patient had a recent nuclear study showing no ischemia.  There was possible infarct but given normal wall motion on echocardiogram more likely to be attenuation artifact.  She has limited mobility but there is no exertional dyspnea or chest pain.  She may therefore continue to surgery without additional cardiac workup.  Given long history of diabetes mellitus and documented vascular disease there will be somewhat increased risk.  2 peripheral vascular disease-patient will require surgical revascularization as outlined.  Continue aspirin.  She will need a statin long-term.  3 hypertension-blood pressure intermittently documented to be low.  However patient does have left subclavian stenosis.  Would check blood pressure in right upper extremity only and we  will adjust regimen as needed.  4 hyperlipidemia-add Lipitor 80 mg daily.  Check lipids and liver in 4 weeks.  Kirk Ruths, MD

## 2017-11-25 NOTE — Progress Notes (Signed)
ANTICOAGULATION CONSULT NOTE Pharmacy Consult:  Heparin Indication:  Occluded bypass graft  No Known Allergies  Patient Measurements: Height = 67 inches Weight = 72 kg Heparin Dosing Weight: 71 kg  Vital Signs: Temp: 98.8 F (37.1 C) (01/07 2031) Temp Source: Oral (01/07 2031) BP: 111/80 (01/08 0146) Pulse Rate: 88 (01/07 2153)  Labs: Recent Labs    11/24/17 1852 11/25/17 0245  HGB 12.0 11.8*  HCT 38.9 36.6  PLT 313 258  LABPROT 14.9  --   INR 1.18  --   HEPARINUNFRC  --  0.15*  CREATININE 0.84  --     Estimated Creatinine Clearance: 75.3 mL/min (by C-G formula based on SCr of 0.84 mg/dL).   Assessment: 54 to female with PVD and suspected occluded bypass grafts for heparin  Goal of Therapy:  Heparin level 0.3-0.7 units/ml Monitor platelets by anticoagulation protocol: Yes    Plan:  Heparin 2000 units IV bolus, then increase heparin 1250 units/hr Check heparin level in 6 hours.  Phillis Knack, PharmD, BCPS  11/25/2017, 3:36 AM

## 2017-11-26 ENCOUNTER — Inpatient Hospital Stay (HOSPITAL_COMMUNITY): Payer: Medicare Other

## 2017-11-26 DIAGNOSIS — M79605 Pain in left leg: Secondary | ICD-10-CM

## 2017-11-26 LAB — BASIC METABOLIC PANEL
Anion gap: 7 (ref 5–15)
BUN: 26 mg/dL — AB (ref 6–20)
CALCIUM: 8.6 mg/dL — AB (ref 8.9–10.3)
CO2: 26 mmol/L (ref 22–32)
Chloride: 100 mmol/L — ABNORMAL LOW (ref 101–111)
Creatinine, Ser: 0.96 mg/dL (ref 0.44–1.00)
GFR calc Af Amer: >60 mL/min (ref >60)
GLUCOSE: 76 mg/dL (ref 65–99)
Potassium: 4.1 mmol/L (ref 3.5–5.1)
Sodium: 133 mmol/L — ABNORMAL LOW (ref 135–145)

## 2017-11-26 LAB — CBC
HEMATOCRIT: 33.7 % — AB (ref 36.0–46.0)
Hemoglobin: 10.4 g/dL — ABNORMAL LOW (ref 12.0–15.0)
MCH: 25.9 pg — AB (ref 26.0–34.0)
MCHC: 30.9 g/dL (ref 30.0–36.0)
MCV: 84 fL (ref 78.0–100.0)
PLATELETS: 283 10*3/uL (ref 150–400)
RBC: 4.01 MIL/uL (ref 3.87–5.11)
RDW: 14.2 % (ref 11.5–15.5)
WBC: 11.7 10*3/uL — ABNORMAL HIGH (ref 4.0–10.5)

## 2017-11-26 LAB — GLUCOSE, CAPILLARY
GLUCOSE-CAPILLARY: 72 mg/dL (ref 65–99)
Glucose-Capillary: 148 mg/dL — ABNORMAL HIGH (ref 65–99)
Glucose-Capillary: 90 mg/dL (ref 65–99)
Glucose-Capillary: 131 mg/dL — ABNORMAL HIGH (ref 65–99)

## 2017-11-26 LAB — HEPARIN LEVEL (UNFRACTIONATED): Heparin Unfractionated: 0.26 IU/mL — ABNORMAL LOW (ref 0.30–0.70)

## 2017-11-26 MED ORDER — IOPAMIDOL (ISOVUE-370) INJECTION 76%
INTRAVENOUS | Status: AC
  Administered 2017-11-26: 100 mL
  Filled 2017-11-26: qty 100

## 2017-11-26 NOTE — Progress Notes (Addendum)
Pt sleeping comfortably this morning.  I did not wake her.  CTA done earlier this am-results pending.    Dr. Trula Slade will be by to see pt and discuss results with her, and discuss plan.  Cardiology says:  patient had a recent nuclear study showing no ischemia.  There was possible infarct but given normal wall motion on echocardiogram more likely to be attenuation artifact.  She has limited mobility but there is no exertional dyspnea or chest pain.  She may therefore continue to surgery without additional cardiac workup.  Given long history of diabetes mellitus and documented vascular disease there will be somewhat increased risk.   Leontine Locket, [facility] 11/26/2017 7:49 AM Patient seen and examined tonight.  We are planning for aortobifemoral bypass graf on Friday..  I have reviewed her CT angiogram which shows interval occlusion of the left iliac system as well as a left femoral-popliteal bypass graft.  The right femoral-popliteal bypass graft remains patent despite occlusion of the right iliac artery.  [name]

## 2017-11-26 NOTE — Progress Notes (Signed)
ANTICOAGULATION CONSULT NOTE  Pharmacy Consult:  Heparin Indication:  Occluded bypass graft  No Known Allergies  Patient Measurements: Height = 67 inches Weight = 72 kg Heparin Dosing Weight: 71 kg  Vital Signs: Temp: 99 F (37.2 C) (01/09 0618) Temp Source: Oral (01/09 0413) BP: 104/66 (01/09 0618) Pulse Rate: 78 (01/09 0413)  Labs: Recent Labs    11/24/17 1852  11/25/17 0245 11/25/17 0939 11/25/17 1945 11/26/17 0612  HGB 12.0  --  11.8*  --   --  10.4*  HCT 38.9  --  36.6  --   --  33.7*  PLT 313  --  258  --   --  283  LABPROT 14.9  --   --   --   --   --   INR 1.18  --   --   --   --   --   HEPARINUNFRC  --    < > 0.15* 0.47 0.34 0.26*  CREATININE 0.84  --   --   --   --  0.96   < > = values in this interval not displayed.    Estimated Creatinine Clearance: 71.4 mL/min (by C-G formula based on SCr of 0.96 mg/dL).     Assessment: 81 YOF presented for follow-up of bilateral fem-pop bypass grafts for ulcer disease.  MD suspects occluded graft and continues on heparin  Heparin level = 0.26 this AM CBC stable  Goal of Therapy:  Heparin level 0.3-0.7 units/ml Monitor platelets by anticoagulation protocol: Yes    Plan:  Increase heparin to 1450 units / hr F/U AM labs  Thank you Anette Guarneri, PharmD [phone] 11/26/2017, 8:42 AM

## 2017-11-27 LAB — GLUCOSE, CAPILLARY
GLUCOSE-CAPILLARY: 128 mg/dL — AB (ref 65–99)
Glucose-Capillary: 40 mg/dL — CL (ref 65–99)
Glucose-Capillary: 93 mg/dL (ref 65–99)
Glucose-Capillary: 165 mg/dL — ABNORMAL HIGH (ref 65–99)
Glucose-Capillary: 109 mg/dL — ABNORMAL HIGH (ref 65–99)

## 2017-11-27 LAB — CBC
HCT: 36.7 % (ref 36.0–46.0)
HEMOGLOBIN: 11.5 g/dL — AB (ref 12.0–15.0)
MCH: 26.3 pg (ref 26.0–34.0)
MCHC: 31.3 g/dL (ref 30.0–36.0)
MCV: 84 fL (ref 78.0–100.0)
PLATELETS: 338 10*3/uL (ref 150–400)
RBC: 4.37 MIL/uL (ref 3.87–5.11)
RDW: 14.2 % (ref 11.5–15.5)
WBC: 15.8 10*3/uL — AB (ref 4.0–10.5)

## 2017-11-27 LAB — HEPARIN LEVEL (UNFRACTIONATED)
HEPARIN UNFRACTIONATED: 0.17 [IU]/mL — AB (ref 0.30–0.70)
Heparin Unfractionated: <0.1 [IU]/mL — ABNORMAL LOW (ref 0.30–0.70)

## 2017-11-27 LAB — SURGICAL PCR SCREEN
MRSA, PCR: NEGATIVE
STAPHYLOCOCCUS AUREUS: NEGATIVE

## 2017-11-27 MED ORDER — INSULIN GLARGINE 100 UNIT/ML ~~LOC~~ SOLN
28.0000 [IU] | Freq: Two times a day (BID) | SUBCUTANEOUS | Status: DC
  Administered 2017-11-27 (×2): 28 [IU] via SUBCUTANEOUS
  Filled 2017-11-27 (×4): qty 0.28

## 2017-11-27 MED ORDER — CEFAZOLIN SODIUM-DEXTROSE 2-4 GM/100ML-% IV SOLN
2.0000 g | INTRAVENOUS | Status: DC
  Filled 2017-11-27: qty 100

## 2017-11-27 MED ORDER — HEPARIN BOLUS VIA INFUSION
2000.0000 [IU] | Freq: Once | INTRAVENOUS | Status: AC
  Administered 2017-11-27: 2000 [IU] via INTRAVENOUS
  Filled 2017-11-27: qty 2000

## 2017-11-27 NOTE — Progress Notes (Signed)
Inpatient Diabetes Program Recommendations  AACE/ADA: New Consensus Statement on Inpatient Glycemic Control (2015)  Target Ranges:  Prepandial:   less than 140 mg/dL      Peak postprandial:   less than 180 mg/dL (1-2 hours)      Critically ill patients:  140 - 180 mg/dL   Results for ZARYAH, SECKEL (MRN [id number]) as of 11/27/2017 09:40  Ref. Range 11/26/2017 11:37 11/26/2017 16:33 11/26/2017 21:48 11/27/2017 08:28 11/27/2017 09:30  Glucose-Capillary Latest Ref Range: 65 - 99 mg/dL 148 (H) 90 131 (H) 40 (LL) 93   Review of Glycemic Control  Diabetes history: DM 2 Outpatient Diabetes medications: Lantus 36 units BID, Humalog 10 units tid Current orders for Inpatient glycemic control: Lantus 36 units BID, Novolog Sensitive 0-9 units tid  Inpatient Diabetes Program Recommendations:    Hypoglycemia this am in the 40's. Please decrease Lantus to 28 units BID.  Thanks,  Tama Headings RN, MSN, [facility] Inpatient Diabetes Coordinator Team Pager [phone] (8a-5p)

## 2017-11-27 NOTE — Progress Notes (Signed)
    Subjective  -   No acute events   Physical Exam:  Right leg ulcer abd soft Non-labored respirations       Assessment/Plan:    The patient has been evaluated by cardiology who is cleared for surgery.  We discussed proceeding with an aortobifemoral bypass graft and bilateral femoral-popliteal bypass grafts tomorrow.  All the details of the procedure were discussed with the patient.  She is in full agreement and wants to proceed.  She will be n.p.o. after midnight.  Wells Brabham 11/27/2017 12:42 PM --  Vitals:   11/27/17 1212 11/27/17 1222  BP:    Pulse: 96   Resp: (!) 9 14  Temp: 98.6 F (37 C)   SpO2: 98% 98%    Intake/Output Summary (Last 24 hours) at 11/27/2017 1242 Last data filed at 11/27/2017 0852 Gross per 24 hour  Intake 1323.4 ml  Output 1825 ml  Net -501.6 ml     Laboratory CBC    Component Value Date/Time   WBC 15.8 (H) 11/27/2017 1051   HGB 11.5 (L) 11/27/2017 1051   HCT 36.7 11/27/2017 1051   PLT 338 11/27/2017 1051    BMET    Component Value Date/Time   NA 133 (L) 11/26/2017 0612   K 4.1 11/26/2017 0612   CL 100 (L) 11/26/2017 0612   CO2 26 11/26/2017 0612   GLUCOSE 76 11/26/2017 0612   BUN 26 (H) 11/26/2017 0612   CREATININE 0.96 11/26/2017 0612   CREATININE 1.24 (H) 08/12/2017 1530   CALCIUM 8.6 (L) 11/26/2017 0612   GFRNONAA >60 11/26/2017 0612   GFRNONAA 50 (L) 08/12/2017 1530   GFRAA >60 11/26/2017 0612   GFRAA 57 (L) 08/12/2017 1530    COAG Lab Results  Component Value Date   INR 1.18 11/24/2017   INR 1.04 12/31/2016   INR 1.06 12/23/2016   No results found for: PTT  Antibiotics Anti-infectives (From admission, onward)   Start     Dose/Rate Route Frequency Ordered Stop   11/27/17 0830  ceFAZolin (ANCEF) IVPB 2g/100 mL premix     2 g 200 mL/hr over 30 Minutes Intravenous To Short Stay 11/27/17 0813 11/28/17 0830   11/25/17 1400  azithromycin (ZITHROMAX) tablet 500 mg    Comments:  Please dose appropriately  for sinus infection   500 mg Oral Daily 11/25/17 1257     11/24/17 2200  doxycycline (VIBRA-TABS) tablet 100 mg     100 mg Oral 2 times daily 11/24/17 1807         V. Leia Alf, M.D. [facility] Office: [phone] Pager:  [phone]

## 2017-11-27 NOTE — Progress Notes (Signed)
ANTICOAGULATION CONSULT NOTE - Follow Up Consult  Pharmacy Consult for Heparin Indication: occluded fem-pop bypass  No Known Allergies  Patient Measurements: Height: 5\' 7"  (170.2 cm) Weight: 157 lb 3.2 oz (71.3 kg) IBW/kg (Calculated) : 61.6 Heparin Dosing Weight: 73.5 kg  Vital Signs: Temp: 98.6 F (37 C) (01/10 1212) Temp Source: Oral (01/10 1212) BP: 97/56 (01/10 0428) Pulse Rate: 96 (01/10 1212)  Labs: Recent Labs    11/24/17 1852 11/25/17 0245  11/25/17 1945 11/26/17 0612 11/27/17 0500 11/27/17 1051  HGB 12.0 11.8*  --   --  10.4*  --  11.5*  HCT 38.9 36.6  --   --  33.7*  --  36.7  PLT 313 258  --   --  283  --  338  LABPROT 14.9  --   --   --   --   --   --   INR 1.18  --   --   --   --   --   --   HEPARINUNFRC  --  0.15*   < > 0.34 0.26* <0.10*  --   CREATININE 0.84  --   --   --  0.96  --   --    < > = values in this interval not displayed.    Estimated Creatinine Clearance: 65.9 mL/min (by C-G formula based on SCr of 0.96 mg/dL).   Medications:  Scheduled:  . aspirin EC  81 mg Oral Daily  . atorvastatin  80 mg Oral q1800  . azithromycin  500 mg Oral Daily  . docusate sodium  100 mg Oral BID  . doxycycline  100 mg Oral BID  . HYDROmorphone   Intravenous Q4H  . insulin aspart  0-9 Units Subcutaneous TID WC  . insulin glargine  28 Units Subcutaneous BID  . pantoprazole  40 mg Oral Daily  . pregabalin  100 mg Oral TID   Infusions:  . sodium chloride Stopped (11/24/17 2153)  .  ceFAZolin (ANCEF) IV    . 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  VVS reviewing for possible surgical intervention.    Heparin level undetectable this AM.  It appears plan for rate increase yesterday not ordered (safety zone completed).  No IV issues noted.  No bleeding noted.  Will increase rate and check confirmation heparin level this PM.  Noted plans for OR  1/11.  Goal of Therapy:  Heparin level 0.3-0.7 units/ml Monitor platelets by anticoagulation protocol: Yes   Plan:  Increase heparin to 1500 units/hr. Check heparin level at 1830 for confirmation. Daily heparin level and CBC Follow-up surgical plans.  [employer], Pharm.D., BCPS Clinical Pharmacist Pager: [phone] Clinical phone for 11/27/2017 from 8:30-4:00 is [phone]. After 4pm, please call Main [facility] [phone]) for assistance. 11/27/2017 12:17 PM

## 2017-11-27 NOTE — Progress Notes (Signed)
ANTICOAGULATION CONSULT NOTE - Follow Up Consult  Pharmacy Consult for Heparin Indication: occluded fem-pop bypass  No Known Allergies  Patient Measurements: Height: 5\' 7"  (170.2 cm) Weight: 157 lb 3.2 oz (71.3 kg) IBW/kg (Calculated) : 61.6 Heparin Dosing Weight: 73.5 kg  Vital Signs: Temp: 98.7 F (37.1 C) (01/10 1713) Temp Source: Oral (01/10 1713) Pulse Rate: 105 (01/10 1713)  Labs: Recent Labs    11/25/17 0245  11/26/17 0612 11/27/17 0500 11/27/17 1051 11/27/17 1802  HGB 11.8*  --  10.4*  --  11.5*  --   HCT 36.6  --  33.7*  --  36.7  --   PLT 258  --  283  --  338  --   HEPARINUNFRC 0.15*   < > 0.26* <0.10*  --  0.17*  CREATININE  --   --  0.96  --   --   --    < > = values in this interval not displayed.    Estimated Creatinine Clearance: 65.9 mL/min (by C-G formula based on SCr of 0.96 mg/dL).   Medications:  Scheduled:  . aspirin EC  81 mg Oral Daily  . atorvastatin  80 mg Oral q1800  . azithromycin  500 mg Oral Daily  . docusate sodium  100 mg Oral BID  . doxycycline  100 mg Oral BID  . HYDROmorphone   Intravenous Q4H  . insulin aspart  0-9 Units Subcutaneous TID WC  . insulin glargine  28 Units Subcutaneous BID  . pantoprazole  40 mg Oral Daily  . pregabalin  100 mg Oral TID   Infusions:  . sodium chloride Stopped (11/24/17 2153)  .  ceFAZolin (ANCEF) IV    . 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  VVS reviewing for possible surgical intervention.    Heparin level up to 0.17 but remains sub-therapeutic. No IV issues noted.  No bleeding noted.  Will increase rate and check confirmation heparin level.  Noted plans for OR 1/11.  Goal of Therapy:  Heparin level 0.3-0.7 units/ml Monitor platelets by anticoagulation protocol: Yes   Plan:  Bolus heparin 2000 units x1. Increase heparin to 1750 units/hr. Check heparin level in 6 hours for  confirmation. Daily heparin level and CBC Follow-up surgical plans.  Sloan Leiter, PharmD, BCPS, BCCCP Clinical Pharmacist Clinical phone 11/27/2017 until 11PM [phone] After hours, please call #[phone] 11/27/2017 7:58 PM

## 2017-11-28 ENCOUNTER — Inpatient Hospital Stay (HOSPITAL_COMMUNITY): Payer: Medicare Other | Admitting: Anesthesiology

## 2017-11-28 ENCOUNTER — Inpatient Hospital Stay (HOSPITAL_COMMUNITY): Payer: Medicare Other | Admitting: Certified Registered"

## 2017-11-28 ENCOUNTER — Inpatient Hospital Stay (HOSPITAL_COMMUNITY): Payer: Medicare Other

## 2017-11-28 ENCOUNTER — Encounter (HOSPITAL_COMMUNITY)
Admission: AD | Disposition: A | Discharge status: Home Health | Payer: Self-pay | Source: Ambulatory Visit | Attending: Surgery

## 2017-11-28 ENCOUNTER — Encounter (HOSPITAL_COMMUNITY): Payer: Self-pay | Admitting: Certified Registered"

## 2017-11-28 HISTORY — PX: AORTA - BILATERAL FEMORAL ARTERY BYPASS GRAFT: SHX1175

## 2017-11-28 HISTORY — PX: APPLICATION OF WOUND VAC: SHX5189

## 2017-11-28 HISTORY — PX: THROMBECTOMY FEMORAL ARTERY: SHX6406

## 2017-11-28 HISTORY — PX: INTRAOPERATIVE ARTERIOGRAM: SHX5157

## 2017-11-28 HISTORY — PX: EMBOLECTOMY: SHX44

## 2017-11-28 LAB — POCT I-STAT 7, (LYTES, BLD GAS, ICA,H+H)
Acid-base deficit: 1 mmol/L (ref 0.0–2.0)
BICARBONATE: 23.7 mmol/L (ref 20.0–28.0)
Bicarbonate: 23 mmol/L (ref 20.0–28.0)
Calcium, Ion: 1.14 mmol/L — ABNORMAL LOW (ref 1.15–1.40)
Calcium, Ion: 1.17 mmol/L (ref 1.15–1.40)
HCT: 23 % — ABNORMAL LOW (ref 36.0–46.0)
HCT: 33 % — ABNORMAL LOW (ref 36.0–46.0)
HEMOGLOBIN: 11.2 g/dL — AB (ref 12.0–15.0)
Hemoglobin: 7.8 g/dL — ABNORMAL LOW (ref 12.0–15.0)
O2 Saturation: 100 %
O2 Saturation: 100 %
PCO2 ART: 30.8 mmHg — AB (ref 32.0–48.0)
PH ART: 7.491 — AB (ref 7.350–7.450)
PO2 ART: 251 mmHg — AB (ref 83.0–108.0)
POTASSIUM: 3.6 mmol/L (ref 3.5–5.1)
Potassium: 4 mmol/L (ref 3.5–5.1)
Sodium: 138 mmol/L (ref 135–145)
Sodium: 137 mmol/L (ref 135–145)
TCO2: 25 mmol/L (ref 22–32)
TCO2: 24 mmol/L (ref 22–32)
pCO2 arterial: 37 mmHg (ref 32.0–48.0)
pH, Arterial: 7.402 (ref 7.350–7.450)
pO2, Arterial: 225 mmHg — ABNORMAL HIGH (ref 83.0–108.0)

## 2017-11-28 LAB — POCT ACTIVATED CLOTTING TIME
ACTIVATED CLOTTING TIME: 186 s
ACTIVATED CLOTTING TIME: 224 s
ACTIVATED CLOTTING TIME: 274 s
Activated Clotting Time: 301 seconds
Activated Clotting Time: 230 seconds
Activated Clotting Time: 235 seconds

## 2017-11-28 LAB — BASIC METABOLIC PANEL
ANION GAP: 7 (ref 5–15)
Anion gap: 8 (ref 5–15)
BUN: 23 mg/dL — ABNORMAL HIGH (ref 6–20)
BUN: 15 mg/dL (ref 6–20)
CALCIUM: 8 mg/dL — AB (ref 8.9–10.3)
CHLORIDE: 97 mmol/L — AB (ref 101–111)
CO2: 25 mmol/L (ref 22–32)
CO2: 22 mmol/L (ref 22–32)
CREATININE: 0.7 mg/dL (ref 0.44–1.00)
Calcium: 8.1 mg/dL — ABNORMAL LOW (ref 8.9–10.3)
Chloride: 103 mmol/L (ref 101–111)
Creatinine, Ser: 1.07 mg/dL — ABNORMAL HIGH (ref 0.44–1.00)
GFR calc Af Amer: >60 mL/min (ref >60)
GFR calc non Af Amer: 58 mL/min — ABNORMAL LOW (ref >60)
GLUCOSE: 96 mg/dL (ref 65–99)
Glucose, Bld: 152 mg/dL — ABNORMAL HIGH (ref 65–99)
POTASSIUM: 4.7 mmol/L (ref 3.5–5.1)
Potassium: 4.1 mmol/L (ref 3.5–5.1)
SODIUM: 133 mmol/L — AB (ref 135–145)
Sodium: 129 mmol/L — ABNORMAL LOW (ref 135–145)

## 2017-11-28 LAB — CBC
HCT: 33.8 % — ABNORMAL LOW (ref 36.0–46.0)
HEMATOCRIT: 31 % — AB (ref 36.0–46.0)
HEMOGLOBIN: 10.1 g/dL — AB (ref 12.0–15.0)
Hemoglobin: 11.5 g/dL — ABNORMAL LOW (ref 12.0–15.0)
MCH: 27.1 pg (ref 26.0–34.0)
MCH: 28.1 pg (ref 26.0–34.0)
MCHC: 32.6 g/dL (ref 30.0–36.0)
MCHC: 34 g/dL (ref 30.0–36.0)
MCV: 83.1 fL (ref 78.0–100.0)
MCV: 82.6 fL (ref 78.0–100.0)
Platelets: 285 10*3/uL (ref 150–400)
Platelets: 239 10*3/uL (ref 150–400)
RBC: 3.73 MIL/uL — ABNORMAL LOW (ref 3.87–5.11)
RBC: 4.09 MIL/uL (ref 3.87–5.11)
RDW: 14.2 % (ref 11.5–15.5)
RDW: 13.7 % (ref 11.5–15.5)
WBC: 13.8 10*3/uL — AB (ref 4.0–10.5)
WBC: 11.5 10*3/uL — ABNORMAL HIGH (ref 4.0–10.5)

## 2017-11-28 LAB — GLUCOSE, CAPILLARY
GLUCOSE-CAPILLARY: 53 mg/dL — AB (ref 65–99)
GLUCOSE-CAPILLARY: 151 mg/dL — AB (ref 65–99)
Glucose-Capillary: 59 mg/dL — ABNORMAL LOW (ref 65–99)
Glucose-Capillary: 156 mg/dL — ABNORMAL HIGH (ref 65–99)
Glucose-Capillary: 141 mg/dL — ABNORMAL HIGH (ref 65–99)
Glucose-Capillary: 142 mg/dL — ABNORMAL HIGH (ref 65–99)
Glucose-Capillary: 147 mg/dL — ABNORMAL HIGH (ref 65–99)

## 2017-11-28 LAB — PREPARE RBC (CROSSMATCH)

## 2017-11-28 LAB — POCT I-STAT 4, (NA,K, GLUC, HGB,HCT)
Glucose, Bld: 136 mg/dL — ABNORMAL HIGH (ref 65–99)
Glucose, Bld: 135 mg/dL — ABNORMAL HIGH (ref 65–99)
HCT: 28 % — ABNORMAL LOW (ref 36.0–46.0)
HEMATOCRIT: 31 % — AB (ref 36.0–46.0)
HEMOGLOBIN: 9.5 g/dL — AB (ref 12.0–15.0)
Hemoglobin: 10.5 g/dL — ABNORMAL LOW (ref 12.0–15.0)
POTASSIUM: 4 mmol/L (ref 3.5–5.1)
POTASSIUM: 3.9 mmol/L (ref 3.5–5.1)
SODIUM: 136 mmol/L (ref 135–145)
SODIUM: 136 mmol/L (ref 135–145)

## 2017-11-28 LAB — PROTIME-INR
INR: 1.3
INR: 1.4
PROTHROMBIN TIME: 17 s — AB (ref 11.4–15.2)
Prothrombin Time: 16.1 seconds — ABNORMAL HIGH (ref 11.4–15.2)

## 2017-11-28 LAB — BLOOD GAS, ARTERIAL
ACID-BASE DEFICIT: 0.8 mmol/L (ref 0.0–2.0)
BICARBONATE: 23.7 mmol/L (ref 20.0–28.0)
FIO2: 21
O2 SAT: 99.2 %
PCO2 ART: 41.7 mmHg (ref 32.0–48.0)
PO2 ART: 230 mmHg — AB (ref 83.0–108.0)
Patient temperature: 98.6
pH, Arterial: 7.372 (ref 7.350–7.450)

## 2017-11-28 LAB — APTT: aPTT: 42 seconds — ABNORMAL HIGH (ref 24–36)

## 2017-11-28 LAB — MAGNESIUM: Magnesium: 1.3 mg/dL — ABNORMAL LOW (ref 1.7–2.4)

## 2017-11-28 LAB — HEPARIN LEVEL (UNFRACTIONATED): Heparin Unfractionated: 0.5 IU/mL (ref 0.30–0.70)

## 2017-11-28 SURGERY — CREATION, BYPASS, ARTERIAL, AORTA TO FEMORAL, BILATERAL, USING GRAFT
Anesthesia: General | Site: Groin

## 2017-11-28 SURGERY — EMBOLECTOMY
Anesthesia: General | Site: Leg Lower | Laterality: Left

## 2017-11-28 MED ORDER — ROCURONIUM BROMIDE 10 MG/ML (PF) SYRINGE
PREFILLED_SYRINGE | INTRAVENOUS | Status: AC
  Filled 2017-11-28: qty 5

## 2017-11-28 MED ORDER — ALBUTEROL SULFATE HFA 108 (90 BASE) MCG/ACT IN AERS
INHALATION_SPRAY | RESPIRATORY_TRACT | Status: AC
  Filled 2017-11-28: qty 6.7

## 2017-11-28 MED ORDER — LACTATED RINGERS IV SOLN
INTRAVENOUS | Status: DC | PRN
  Administered 2017-11-28 (×2): via INTRAVENOUS

## 2017-11-28 MED ORDER — FENTANYL CITRATE (PF) 250 MCG/5ML IJ SOLN
INTRAMUSCULAR | Status: AC
  Filled 2017-11-28: qty 5

## 2017-11-28 MED ORDER — OXYCODONE HCL 5 MG/5ML PO SOLN: 5.0000 mg | Freq: Once | ORAL | Status: DC | PRN

## 2017-11-28 MED ORDER — PHENYLEPHRINE 40 MCG/ML (10ML) SYRINGE FOR IV PUSH (FOR BLOOD PRESSURE SUPPORT)
PREFILLED_SYRINGE | INTRAVENOUS | Status: DC | PRN
  Administered 2017-11-28 (×7): 80 ug via INTRAVENOUS

## 2017-11-28 MED ORDER — PHENYLEPHRINE HCL 10 MG/ML IJ SOLN
INTRAVENOUS | Status: DC | PRN
  Administered 2017-11-28: 10 ug/min via INTRAVENOUS

## 2017-11-28 MED ORDER — MAGNESIUM SULFATE 2 GM/50ML IV SOLN
2.0000 g | Freq: Every day | INTRAVENOUS | Status: AC | PRN
  Administered 2017-11-29: 2 g via INTRAVENOUS
  Filled 2017-11-28: qty 50

## 2017-11-28 MED ORDER — SODIUM CHLORIDE 0.9 % IV SOLN: Freq: Once | INTRAVENOUS | Status: DC

## 2017-11-28 MED ORDER — DOCUSATE SODIUM 100 MG PO CAPS
100.0000 mg | ORAL_CAPSULE | Freq: Every day | ORAL | Status: DC
  Administered 2017-11-30 – 2017-12-09 (×6): 100 mg via ORAL
  Filled 2017-11-28 (×7): qty 1

## 2017-11-28 MED ORDER — CEFAZOLIN SODIUM 1 G IJ SOLR
INTRAMUSCULAR | Status: AC
  Filled 2017-11-28: qty 20

## 2017-11-28 MED ORDER — FENTANYL CITRATE (PF) 250 MCG/5ML IJ SOLN
INTRAMUSCULAR | Status: DC | PRN
  Administered 2017-11-28 – 2017-11-29 (×4): 50 ug via INTRAVENOUS

## 2017-11-28 MED ORDER — POTASSIUM CHLORIDE CRYS ER 20 MEQ PO TBCR
20.0000 meq | EXTENDED_RELEASE_TABLET | Freq: Every day | ORAL | Status: AC | PRN
  Administered 2017-11-30: 20 meq via ORAL
  Filled 2017-11-28: qty 1

## 2017-11-28 MED ORDER — PHENYLEPHRINE HCL 10 MG/ML IJ SOLN
INTRAVENOUS | Status: DC | PRN
  Administered 2017-11-28: 75 ug/min via INTRAVENOUS
  Administered 2017-11-29: 01:00:00 via INTRAVENOUS

## 2017-11-28 MED ORDER — ALBUMIN HUMAN 5 % IV SOLN
INTRAVENOUS | Status: DC | PRN
  Administered 2017-11-28 (×2): via INTRAVENOUS

## 2017-11-28 MED ORDER — DEXAMETHASONE SODIUM PHOSPHATE 4 MG/ML IJ SOLN
INTRAMUSCULAR | Status: DC | PRN
  Administered 2017-11-28: 8 mg via INTRAVENOUS

## 2017-11-28 MED ORDER — EPHEDRINE 5 MG/ML INJ
INTRAVENOUS | Status: AC
  Filled 2017-11-28: qty 10

## 2017-11-28 MED ORDER — ENOXAPARIN SODIUM 40 MG/0.4ML ~~LOC~~ SOLN: 40.0000 mg | SUBCUTANEOUS | Status: DC

## 2017-11-28 MED ORDER — HEPARIN SODIUM (PORCINE) 1000 UNIT/ML IJ SOLN
INTRAMUSCULAR | Status: AC
  Filled 2017-11-28: qty 1

## 2017-11-28 MED ORDER — HYDROMORPHONE HCL 1 MG/ML IJ SOLN
0.5000 mg | INTRAMUSCULAR | Status: DC | PRN
  Administered 2017-11-29 (×2): 0.5 mg via INTRAVENOUS
  Filled 2017-11-28 (×3): qty 1

## 2017-11-28 MED ORDER — SODIUM CHLORIDE 0.9 % IV SOLN
INTRAVENOUS | Status: DC | PRN
  Administered 2017-11-28: 23:00:00 500 mL

## 2017-11-28 MED ORDER — ACETAMINOPHEN 325 MG PO TABS
325.0000 mg | ORAL_TABLET | ORAL | Status: DC | PRN
  Administered 2017-12-01: 650 mg via ORAL
  Filled 2017-11-28: qty 2

## 2017-11-28 MED ORDER — FENTANYL CITRATE (PF) 100 MCG/2ML IJ SOLN
INTRAMUSCULAR | Status: DC | PRN
  Administered 2017-11-28: 50 ug via INTRAVENOUS
  Administered 2017-11-28: 100 ug via INTRAVENOUS
  Administered 2017-11-28: 50 ug via INTRAVENOUS
  Administered 2017-11-28: 150 ug via INTRAVENOUS
  Administered 2017-11-28: 100 ug via INTRAVENOUS
  Administered 2017-11-28: 150 ug via INTRAVENOUS

## 2017-11-28 MED ORDER — LABETALOL HCL 5 MG/ML IV SOLN
INTRAVENOUS | Status: DC | PRN
  Administered 2017-11-28 (×2): 10 mg via INTRAVENOUS

## 2017-11-28 MED ORDER — IODIXANOL 320 MG/ML IV SOLN
INTRAVENOUS | Status: DC | PRN
  Administered 2017-11-28: 17:00:00 via INTRAMUSCULAR

## 2017-11-28 MED ORDER — FENTANYL CITRATE (PF) 100 MCG/2ML IJ SOLN
INTRAMUSCULAR | Status: AC
  Administered 2017-11-28: 50 ug via INTRAVENOUS
  Filled 2017-11-28: qty 2

## 2017-11-28 MED ORDER — 0.9 % SODIUM CHLORIDE (POUR BTL) OPTIME
TOPICAL | Status: DC | PRN
  Administered 2017-11-28: 1000 mL
  Administered 2017-11-28: 3000 mL

## 2017-11-28 MED ORDER — HYDRALAZINE HCL 20 MG/ML IJ SOLN: 5.0000 mg | INTRAMUSCULAR | Status: DC | PRN

## 2017-11-28 MED ORDER — SODIUM CHLORIDE 0.9 % IV SOLN
500.0000 mL | Freq: Once | INTRAVENOUS | Status: AC | PRN
  Administered 2017-11-29: 500 mL via INTRAVENOUS

## 2017-11-28 MED ORDER — FENTANYL CITRATE (PF) 100 MCG/2ML IJ SOLN
25.0000 ug | INTRAMUSCULAR | Status: DC | PRN
  Administered 2017-11-28 (×2): 50 ug via INTRAVENOUS

## 2017-11-28 MED ORDER — PROPOFOL 10 MG/ML IV BOLUS
INTRAVENOUS | Status: DC | PRN
  Administered 2017-11-28: 100 mg via INTRAVENOUS

## 2017-11-28 MED ORDER — MIDAZOLAM HCL 2 MG/2ML IJ SOLN
INTRAMUSCULAR | Status: AC
  Filled 2017-11-28: qty 2

## 2017-11-28 MED ORDER — CEFAZOLIN SODIUM-DEXTROSE 2-3 GM-%(50ML) IV SOLR
INTRAVENOUS | Status: DC | PRN
  Administered 2017-11-28 (×2): 2 g via INTRAVENOUS

## 2017-11-28 MED ORDER — HEPARIN SODIUM (PORCINE) 1000 UNIT/ML IJ SOLN
INTRAMUSCULAR | Status: DC | PRN
  Administered 2017-11-28: 8000 [IU] via INTRAVENOUS
  Administered 2017-11-29: 5000 [IU] via INTRAVENOUS

## 2017-11-28 MED ORDER — ONDANSETRON HCL 4 MG/2ML IJ SOLN
4.0000 mg | Freq: Four times a day (QID) | INTRAMUSCULAR | Status: DC | PRN
  Administered 2017-11-28 – 2017-12-03 (×9): 4 mg via INTRAVENOUS
  Filled 2017-11-28 (×10): qty 2

## 2017-11-28 MED ORDER — CHLORHEXIDINE GLUCONATE CLOTH 2 % EX PADS: 6.0000 | MEDICATED_PAD | Freq: Every day | CUTANEOUS | Status: DC

## 2017-11-28 MED ORDER — SUGAMMADEX SODIUM 200 MG/2ML IV SOLN
INTRAVENOUS | Status: DC | PRN
  Administered 2017-11-28: 300 mg via INTRAVENOUS

## 2017-11-28 MED ORDER — PROTAMINE SULFATE 10 MG/ML IV SOLN
INTRAVENOUS | Status: DC | PRN
  Administered 2017-11-28: 25 mg via INTRAVENOUS
  Administered 2017-11-28: 10 mg via INTRAVENOUS
  Administered 2017-11-28: 5 mg via INTRAVENOUS
  Administered 2017-11-28: 10 mg via INTRAVENOUS

## 2017-11-28 MED ORDER — LACTATED RINGERS IV SOLN
INTRAVENOUS | Status: DC | PRN
  Administered 2017-11-28: 11:00:00 via INTRAVENOUS

## 2017-11-28 MED ORDER — PROPOFOL 10 MG/ML IV BOLUS
INTRAVENOUS | Status: AC
  Filled 2017-11-28: qty 20

## 2017-11-28 MED ORDER — LABETALOL HCL 5 MG/ML IV SOLN: 10.0000 mg | INTRAVENOUS | Status: DC | PRN

## 2017-11-28 MED ORDER — SUGAMMADEX SODIUM 200 MG/2ML IV SOLN
INTRAVENOUS | Status: AC
  Filled 2017-11-28: qty 2

## 2017-11-28 MED ORDER — SODIUM CHLORIDE 0.9 % IV SOLN: INTRAVENOUS | Status: DC | PRN

## 2017-11-28 MED ORDER — ONDANSETRON HCL 4 MG/2ML IJ SOLN
INTRAMUSCULAR | Status: DC | PRN
  Administered 2017-11-28: 4 mg via INTRAVENOUS

## 2017-11-28 MED ORDER — HEPARIN SODIUM (PORCINE) 1000 UNIT/ML IJ SOLN
INTRAMUSCULAR | Status: AC
  Filled 2017-11-28: qty 2

## 2017-11-28 MED ORDER — OXYCODONE HCL 5 MG PO TABS: 5.0000 mg | ORAL_TABLET | Freq: Once | ORAL | Status: DC | PRN

## 2017-11-28 MED ORDER — SODIUM CHLORIDE 0.9 % IV SOLN
INTRAVENOUS | Status: DC | PRN
  Administered 2017-11-28: 5 ug/kg/min via INTRAVENOUS

## 2017-11-28 MED ORDER — ARTIFICIAL TEARS OPHTHALMIC OINT
TOPICAL_OINTMENT | OPHTHALMIC | Status: DC | PRN
  Administered 2017-11-28: 1 via OPHTHALMIC

## 2017-11-28 MED ORDER — THROMBIN (RECOMBINANT) 5000 UNITS EX SOLR
CUTANEOUS | Status: AC
  Filled 2017-11-28: qty 5000

## 2017-11-28 MED ORDER — 0.9 % SODIUM CHLORIDE (POUR BTL) OPTIME
TOPICAL | Status: DC | PRN
  Administered 2017-11-28: 2000 mL

## 2017-11-28 MED ORDER — LIDOCAINE 2% (20 MG/ML) 5 ML SYRINGE
INTRAMUSCULAR | Status: DC | PRN
  Administered 2017-11-28: 60 mg via INTRAVENOUS

## 2017-11-28 MED ORDER — LIDOCAINE 2% (20 MG/ML) 5 ML SYRINGE
INTRAMUSCULAR | Status: AC
  Filled 2017-11-28: qty 5

## 2017-11-28 MED ORDER — PHENOL 1.4 % MT LIQD
1.0000 | OROMUCOSAL | Status: DC | PRN
  Administered 2017-11-30: 1 via OROMUCOSAL
  Filled 2017-11-28: qty 177

## 2017-11-28 MED ORDER — ALUM & MAG HYDROXIDE-SIMETH 200-200-20 MG/5ML PO SUSP: 15.0000 mL | ORAL | Status: DC | PRN

## 2017-11-28 MED ORDER — CLEVIDIPINE BUTYRATE 0.5 MG/ML IV EMUL
1.0000 mg/h | INTRAVENOUS | Status: DC
  Filled 2017-11-28: qty 50

## 2017-11-28 MED ORDER — DEXTROSE 5 % IV SOLN
1.5000 g | Freq: Two times a day (BID) | INTRAVENOUS | Status: AC
  Administered 2017-11-29: 1.5 g via INTRAVENOUS
  Filled 2017-11-28 (×2): qty 1.5

## 2017-11-28 MED ORDER — ONDANSETRON HCL 4 MG/2ML IJ SOLN
INTRAMUSCULAR | Status: AC
  Filled 2017-11-28: qty 2

## 2017-11-28 MED ORDER — BISACODYL 10 MG RE SUPP: 10.0000 mg | Freq: Every day | RECTAL | Status: DC | PRN

## 2017-11-28 MED ORDER — SODIUM CHLORIDE 0.9 % IV SOLN
INTRAVENOUS | Status: DC | PRN
  Administered 2017-11-28: 12:00:00

## 2017-11-28 MED ORDER — LACTATED RINGERS IV SOLN
INTRAVENOUS | Status: DC
  Administered 2017-11-28: 50 mL/h via INTRAVENOUS

## 2017-11-28 MED ORDER — HEPARIN SODIUM (PORCINE) 1000 UNIT/ML IJ SOLN
INTRAMUSCULAR | Status: DC | PRN
  Administered 2017-11-28: 3000 [IU] via INTRAVENOUS
  Administered 2017-11-28: 2000 [IU] via INTRAVENOUS
  Administered 2017-11-28: 1000 [IU] via INTRAVENOUS
  Administered 2017-11-28: 8000 [IU] via INTRAVENOUS
  Administered 2017-11-28 (×2): 1000 [IU] via INTRAVENOUS

## 2017-11-28 MED ORDER — SODIUM CHLORIDE 0.9 % IV SOLN
INTRAVENOUS | Status: DC | PRN
  Administered 2017-11-28: 13:00:00 via INTRAVENOUS

## 2017-11-28 MED ORDER — PROTAMINE SULFATE 10 MG/ML IV SOLN
INTRAVENOUS | Status: AC
  Filled 2017-11-28: qty 5

## 2017-11-28 MED ORDER — ROCURONIUM BROMIDE 100 MG/10ML IV SOLN
INTRAVENOUS | Status: DC | PRN
  Administered 2017-11-28: 20 mg via INTRAVENOUS
  Administered 2017-11-28: 40 mg via INTRAVENOUS
  Administered 2017-11-28: 10 mg via INTRAVENOUS
  Administered 2017-11-28: 60 mg via INTRAVENOUS

## 2017-11-28 MED ORDER — DEXTROSE 50 % IV SOLN
INTRAVENOUS | Status: DC | PRN
  Administered 2017-11-28: 25 g via INTRAVENOUS

## 2017-11-28 MED ORDER — HEMOSTATIC AGENTS (NO CHARGE) OPTIME
TOPICAL | Status: DC | PRN
  Administered 2017-11-28 (×3): 1 via TOPICAL

## 2017-11-28 MED ORDER — DEXTROSE 50 % IV SOLN
INTRAVENOUS | Status: AC
  Filled 2017-11-28: qty 50

## 2017-11-28 MED ORDER — GUAIFENESIN-DM 100-10 MG/5ML PO SYRP: 15.0000 mL | ORAL_SOLUTION | ORAL | Status: DC | PRN

## 2017-11-28 MED ORDER — LACTATED RINGERS IV SOLN
INTRAVENOUS | Status: DC | PRN
  Administered 2017-11-28 – 2017-11-29 (×3): via INTRAVENOUS

## 2017-11-28 MED ORDER — DEXAMETHASONE SODIUM PHOSPHATE 10 MG/ML IJ SOLN
INTRAMUSCULAR | Status: AC
  Filled 2017-11-28: qty 1

## 2017-11-28 MED ORDER — PANTOPRAZOLE SODIUM 40 MG PO TBEC
40.0000 mg | DELAYED_RELEASE_TABLET | Freq: Every day | ORAL | Status: DC
  Administered 2017-11-29: 40 mg via ORAL
  Filled 2017-11-28 (×2): qty 1

## 2017-11-28 MED ORDER — PROPOFOL 10 MG/ML IV BOLUS
INTRAVENOUS | Status: DC | PRN
  Administered 2017-11-28: 150 mg via INTRAVENOUS

## 2017-11-28 MED ORDER — KCL IN DEXTROSE-NACL 20-5-0.45 MEQ/L-%-% IV SOLN
INTRAVENOUS | Status: DC
  Administered 2017-11-29 (×2): 125 mL via INTRAVENOUS
  Administered 2017-11-30: 05:00:00 via INTRAVENOUS
  Filled 2017-11-28 (×4): qty 1000

## 2017-11-28 MED ORDER — DEXTROSE 50 % IV SOLN
1.0000 | Freq: Once | INTRAVENOUS | Status: AC
  Administered 2017-11-28: 50 mL via INTRAVENOUS

## 2017-11-28 MED ORDER — MIDAZOLAM HCL 5 MG/5ML IJ SOLN
INTRAMUSCULAR | Status: DC | PRN
  Administered 2017-11-28: 2 mg via INTRAVENOUS

## 2017-11-28 MED ORDER — ROCURONIUM BROMIDE 10 MG/ML (PF) SYRINGE
PREFILLED_SYRINGE | INTRAVENOUS | Status: DC | PRN
  Administered 2017-11-28: 10 mg via INTRAVENOUS
  Administered 2017-11-28: 40 mg via INTRAVENOUS

## 2017-11-28 MED ORDER — SODIUM CHLORIDE 0.9 % IV SOLN
0.1000 mg/kg/h | INTRAVENOUS | Status: DC
  Filled 2017-11-28: qty 2

## 2017-11-28 MED ORDER — ACETAMINOPHEN 325 MG RE SUPP
325.0000 mg | RECTAL | Status: DC | PRN
  Administered 2017-11-29 – 2017-11-30 (×4): 650 mg via RECTAL
  Filled 2017-11-28 (×5): qty 2

## 2017-11-28 MED ORDER — PHENYLEPHRINE 40 MCG/ML (10ML) SYRINGE FOR IV PUSH (FOR BLOOD PRESSURE SUPPORT)
PREFILLED_SYRINGE | INTRAVENOUS | Status: AC
  Filled 2017-11-28: qty 10

## 2017-11-28 MED ORDER — METOPROLOL TARTRATE 5 MG/5ML IV SOLN: 2.0000 mg | INTRAVENOUS | Status: DC | PRN

## 2017-11-28 SURGICAL SUPPLY — 106 items
ADH SKN CLS APL DERMABOND .7 (GAUZE/BANDAGES/DRESSINGS) ×4
BAG ISL DRAPE 18X18 STRL (DRAPES) ×8
BAG ISOLATION DRAPE 18X18 (DRAPES) ×2 IMPLANT
BAG SNAP BAND KOVER 36X36 (MISCELLANEOUS) ×3 IMPLANT
BANDAGE ESMARK 6X9 LF (GAUZE/BANDAGES/DRESSINGS) IMPLANT
BNDG CMPR 9X6 STRL LF SNTH (GAUZE/BANDAGES/DRESSINGS)
BNDG ESMARK 6X9 LF (GAUZE/BANDAGES/DRESSINGS)
CANISTER SUCT 3000ML PPV (MISCELLANEOUS) ×6 IMPLANT
CANNULA VESSEL 3MM 2 BLNT TIP (CANNULA) ×6 IMPLANT
CATH EMB 3FR 80CM (CATHETERS) ×3 IMPLANT
CATH EMB 4FR 80CM (CATHETERS) ×3 IMPLANT
CLIP VESOCCLUDE MED 24/CT (CLIP) ×6 IMPLANT
CLIP VESOCCLUDE SM WIDE 24/CT (CLIP) ×6 IMPLANT
CONNECTOR Y ATS VAC SYSTEM (MISCELLANEOUS) ×3 IMPLANT
COVER DOME SNAP 22 D (MISCELLANEOUS) ×3 IMPLANT
COVER MAYO STAND STRL (DRAPES) ×6 IMPLANT
CUFF TOURNIQUET SINGLE 24IN (TOURNIQUET CUFF) IMPLANT
CUFF TOURNIQUET SINGLE 34IN LL (TOURNIQUET CUFF) IMPLANT
CUFF TOURNIQUET SINGLE 44IN (TOURNIQUET CUFF) IMPLANT
DERMABOND ADVANCED (GAUZE/BANDAGES/DRESSINGS) ×2
DERMABOND ADVANCED .7 DNX12 (GAUZE/BANDAGES/DRESSINGS) ×7 IMPLANT
DRAIN CHANNEL 15F RND FF W/TCR (WOUND CARE) IMPLANT
DRAPE HALF SHEET 40X57 (DRAPES) ×3 IMPLANT
DRAPE ISOLATION BAG 18X18 (DRAPES) ×4
DRAPE X-RAY CASS 24X20 (DRAPES) IMPLANT
DRESSING PREVENA PLUS CUSTOM (GAUZE/BANDAGES/DRESSINGS) ×2 IMPLANT
DRSG PREVENA PLUS CUSTOM (GAUZE/BANDAGES/DRESSINGS) ×12
ELECT BLADE 4.0 EZ CLEAN MEGAD (MISCELLANEOUS) ×6
ELECT BLADE 6.5 EXT (BLADE) ×3 IMPLANT
ELECT CAUTERY BLADE 6.4 (BLADE) IMPLANT
ELECT REM PT RETURN 9FT ADLT (ELECTROSURGICAL) ×12
ELECTRODE BLDE 4.0 EZ CLN MEGD (MISCELLANEOUS) ×4 IMPLANT
ELECTRODE REM PT RTRN 9FT ADLT (ELECTROSURGICAL) ×5 IMPLANT
EVACUATOR SILICONE 100CC (DRAIN) IMPLANT
FELT TEFLON 1X6 (MISCELLANEOUS) ×3 IMPLANT
GLOVE BIOGEL PI IND STRL 7.5 (GLOVE) ×6 IMPLANT
GLOVE BIOGEL PI INDICATOR 7.5 (GLOVE) ×6
GLOVE ECLIPSE 7.0 STRL STRAW (GLOVE) ×3 IMPLANT
GLOVE SS BIOGEL STRL SZ 7.5 (GLOVE) ×1 IMPLANT
GLOVE SUPERSENSE BIOGEL SZ 7.5 (GLOVE) ×2
GLOVE SURG SS PI 7.5 STRL IVOR (GLOVE) ×6 IMPLANT
GOWN STRL REUS W/ TWL LRG LVL3 (GOWN DISPOSABLE) ×9 IMPLANT
GOWN STRL REUS W/ TWL XL LVL3 (GOWN DISPOSABLE) ×6 IMPLANT
GOWN STRL REUS W/TWL LRG LVL3 (GOWN DISPOSABLE) ×18
GOWN STRL REUS W/TWL XL LVL3 (GOWN DISPOSABLE) ×18
GRAFT HEMASHIELD 14X7MM (Vascular Products) ×3 IMPLANT
GRAFT PROPATEN THIN WALL 6X40 (Vascular Products) ×3 IMPLANT
HEMOSTAT SNOW SURGICEL 2X4 (HEMOSTASIS) ×6 IMPLANT
INSERT FOGARTY 61MM (MISCELLANEOUS) ×6 IMPLANT
INSERT FOGARTY SM (MISCELLANEOUS) ×15 IMPLANT
KIT BASIN OR (CUSTOM PROCEDURE TRAY) ×6 IMPLANT
KIT PREVENA INCISION MGT 13 (CANNISTER) ×3 IMPLANT
KIT ROOM TURNOVER OR (KITS) ×6 IMPLANT
MARKER GRAFT CORONARY BYPASS (MISCELLANEOUS) IMPLANT
NS IRRIG 1000ML POUR BTL (IV SOLUTION) ×12 IMPLANT
PACK AORTA (CUSTOM PROCEDURE TRAY) ×6 IMPLANT
PACK PERIPHERAL VASCULAR (CUSTOM PROCEDURE TRAY) ×3 IMPLANT
PAD ARMBOARD 7.5X6 YLW CONV (MISCELLANEOUS) ×12 IMPLANT
PENCIL BUTTON HOLSTER BLD 10FT (ELECTRODE) IMPLANT
PUNCH AORTIC ROTATE 5MM 8IN (MISCELLANEOUS) ×3 IMPLANT
SET COLLECT BLD 21X3/4 12 (NEEDLE) IMPLANT
SET MICROPUNCTURE 5F STIFF (MISCELLANEOUS) ×3 IMPLANT
STOPCOCK 4 WAY LG BORE MALE ST (IV SETS) ×3 IMPLANT
SUT ETHIBOND 5 LR DA (SUTURE) IMPLANT
SUT ETHILON 3 0 PS 1 (SUTURE) IMPLANT
SUT GORETEX 6.0 TT13 (SUTURE) IMPLANT
SUT GORETEX 6.0 TT9 (SUTURE) IMPLANT
SUT PDS AB 1 TP1 54 (SUTURE) ×12 IMPLANT
SUT PROLENE 3 0 SH 48 (SUTURE) ×18 IMPLANT
SUT PROLENE 3 0 SH DA (SUTURE) ×9 IMPLANT
SUT PROLENE 3 0 SH1 36 (SUTURE) ×3 IMPLANT
SUT PROLENE 5 0 C 1 24 (SUTURE) ×18 IMPLANT
SUT PROLENE 5 0 C 1 36 (SUTURE) ×6 IMPLANT
SUT PROLENE 6 0 BV (SUTURE) ×18 IMPLANT
SUT PROLENE 6 0 C 1 24 (SUTURE) ×6 IMPLANT
SUT PROLENE 6 0 CC (SUTURE) ×9 IMPLANT
SUT PROLENE 7 0 BV 1 (SUTURE) IMPLANT
SUT SILK 2 0 (SUTURE) ×12
SUT SILK 2 0 SH (SUTURE) ×6 IMPLANT
SUT SILK 2 0 TIES 10X30 (SUTURE) ×3 IMPLANT
SUT SILK 2 0 TIES 17X18 (SUTURE) ×6
SUT SILK 2 0SH CR/8 30 (SUTURE) ×6 IMPLANT
SUT SILK 2-0 18XBRD TIE 12 (SUTURE) ×5 IMPLANT
SUT SILK 2-0 18XBRD TIE BLK (SUTURE) ×4 IMPLANT
SUT SILK 3 0 (SUTURE) ×12
SUT SILK 3 0 TIES 17X18 (SUTURE) ×6
SUT SILK 3-0 18XBRD TIE 12 (SUTURE) ×5 IMPLANT
SUT SILK 3-0 18XBRD TIE BLK (SUTURE) ×4 IMPLANT
SUT SILK 4 0 (SUTURE) ×6
SUT SILK 4-0 18XBRD TIE 12 (SUTURE) ×1 IMPLANT
SUT VIC AB 2-0 CT1 27 (SUTURE) ×36
SUT VIC AB 2-0 CT1 TAPERPNT 27 (SUTURE) ×21 IMPLANT
SUT VIC AB 3-0 SH 27 (SUTURE) ×12
SUT VIC AB 3-0 SH 27X BRD (SUTURE) ×8 IMPLANT
SUT VICRYL 4-0 PS2 18IN ABS (SUTURE) ×24 IMPLANT
SYR 20CC LL (SYRINGE) ×3 IMPLANT
SYR 3ML LL SCALE MARK (SYRINGE) ×3 IMPLANT
TAPE UMBILICAL COTTON 1/8X30 (MISCELLANEOUS) ×3 IMPLANT
TOWEL BLUE STERILE X RAY DET (MISCELLANEOUS) ×12 IMPLANT
TOWEL GREEN STERILE (TOWEL DISPOSABLE) ×9 IMPLANT
TRAY FOLEY W/METER SILVER 16FR (SET/KITS/TRAYS/PACK) ×6 IMPLANT
TUBING CIL FLEX 10 FLL-RA (TUBING) ×3 IMPLANT
TUBING EXTENTION W/L.L. (IV SETS) IMPLANT
UNDERPAD 30X30 (UNDERPADS AND DIAPERS) ×12 IMPLANT
WATER STERILE IRR 1000ML POUR (IV SOLUTION) ×12 IMPLANT
WND VAC CANISTER 500ML (MISCELLANEOUS) ×3 IMPLANT

## 2017-11-28 SURGICAL SUPPLY — 46 items
ADH SKN CLS APL DERMABOND .7 (GAUZE/BANDAGES/DRESSINGS) ×2
ARMBAND PINK RESTRICT EXTREMIT (MISCELLANEOUS) ×1 IMPLANT
CANISTER SUCT 3000ML PPV (MISCELLANEOUS) ×4 IMPLANT
CATH EMB 3FR 80CM (CATHETERS) ×3 IMPLANT
CATH EMB 4FR 80CM (CATHETERS) ×4 IMPLANT
CLIP VESOCCLUDE MED 6/CT (CLIP) ×4 IMPLANT
CLIP VESOCCLUDE SM WIDE 24/CT (CLIP) ×3 IMPLANT
CLIP VESOCCLUDE SM WIDE 6/CT (CLIP) ×4 IMPLANT
COVER PROBE W GEL 5X96 (DRAPES) ×3 IMPLANT
DERMABOND ADVANCED (GAUZE/BANDAGES/DRESSINGS) ×2
DERMABOND ADVANCED .7 DNX12 (GAUZE/BANDAGES/DRESSINGS) ×2 IMPLANT
DRESSING PREVENA PLUS CUSTOM (GAUZE/BANDAGES/DRESSINGS) ×2 IMPLANT
DRSG COVADERM 4X10 (GAUZE/BANDAGES/DRESSINGS) ×3 IMPLANT
DRSG COVADERM 4X8 (GAUZE/BANDAGES/DRESSINGS) ×3 IMPLANT
DRSG PREVENA PLUS CUSTOM (GAUZE/BANDAGES/DRESSINGS) ×8
ELECT REM PT RETURN 9FT ADLT (ELECTROSURGICAL) ×4
ELECTRODE REM PT RTRN 9FT ADLT (ELECTROSURGICAL) ×2 IMPLANT
GLOVE BIO SURGEON STRL SZ7.5 (GLOVE) ×4 IMPLANT
GLOVE BIOGEL PI IND STRL 6.5 (GLOVE) ×1 IMPLANT
GLOVE BIOGEL PI IND STRL 7.0 (GLOVE) ×1 IMPLANT
GLOVE BIOGEL PI INDICATOR 6.5 (GLOVE) ×2
GLOVE BIOGEL PI INDICATOR 7.0 (GLOVE) ×2
GLOVE ECLIPSE 7.0 STRL STRAW (GLOVE) ×3 IMPLANT
GLOVE SS N UNI LF 7.5 STRL (GLOVE) ×3 IMPLANT
GLOVE SURG SS PI 7.5 STRL IVOR (GLOVE) ×3 IMPLANT
GOWN STRL REUS W/ TWL LRG LVL3 (GOWN DISPOSABLE) ×5 IMPLANT
GOWN STRL REUS W/ TWL XL LVL3 (GOWN DISPOSABLE) ×2 IMPLANT
GOWN STRL REUS W/TWL LRG LVL3 (GOWN DISPOSABLE) ×12
GOWN STRL REUS W/TWL XL LVL3 (GOWN DISPOSABLE) ×4
KIT BASIN OR (CUSTOM PROCEDURE TRAY) ×4 IMPLANT
KIT PREVENA INCISION MGT 13 (CANNISTER) ×3 IMPLANT
KIT ROOM TURNOVER OR (KITS) ×4 IMPLANT
NS IRRIG 1000ML POUR BTL (IV SOLUTION) ×4 IMPLANT
PACK CV ACCESS (CUSTOM PROCEDURE TRAY) ×1 IMPLANT
PACK PERIPHERAL VASCULAR (CUSTOM PROCEDURE TRAY) ×3 IMPLANT
PAD ARMBOARD 7.5X6 YLW CONV (MISCELLANEOUS) ×8 IMPLANT
STAPLER VISISTAT 35W (STAPLE) ×3 IMPLANT
SUT MNCRL AB 4-0 PS2 18 (SUTURE) ×6 IMPLANT
SUT PROLENE 5 0 C1 (SUTURE) ×3 IMPLANT
SUT PROLENE 6 0 BV (SUTURE) ×19 IMPLANT
SUT VIC AB 3-0 SH 27 (SUTURE) ×8
SUT VIC AB 3-0 SH 27X BRD (SUTURE) ×3 IMPLANT
SYRINGE 3CC LL L/F (MISCELLANEOUS) ×6 IMPLANT
TOWEL GREEN STERILE (TOWEL DISPOSABLE) ×4 IMPLANT
UNDERPAD 30X30 (UNDERPADS AND DIAPERS) ×4 IMPLANT
WATER STERILE IRR 1000ML POUR (IV SOLUTION) ×4 IMPLANT

## 2017-11-28 NOTE — Anesthesia Preprocedure Evaluation (Addendum)
Anesthesia Evaluation  Patient identified by MRN, date of birth, ID band Patient awake    Reviewed: Allergy & Precautions, H&P , NPO status , Patient's Chart, lab work & pertinent test results  Airway Mallampati: II  TM Distance: >3 FB Neck ROM: Full    Dental no notable dental hx. (+) Poor Dentition, Dental Advisory Given   Pulmonary asthma , former smoker,    Pulmonary exam normal breath sounds clear to auscultation       Cardiovascular + Peripheral Vascular Disease  negative cardio ROS   Rhythm:Regular Rate:Normal     Neuro/Psych Depression negative neurological ROS  negative psych ROS   GI/Hepatic negative GI ROS, Neg liver ROS,   Endo/Other  diabetes, Type 1, Insulin Dependent  Renal/GU negative Renal ROS  negative genitourinary   Musculoskeletal  (+) Arthritis , Osteoarthritis,    Abdominal   Peds  Hematology negative hematology ROS (+)   Anesthesia Other Findings   Reproductive/Obstetrics negative OB ROS                            Anesthesia Physical Anesthesia Plan  ASA: III and emergent  Anesthesia Plan: General   Post-op Pain Management:    Induction: Intravenous  PONV Risk Score and Plan: 4 or greater and Ondansetron, Dexamethasone and Midazolam  Airway Management Planned: Oral ETT  Additional Equipment:   Intra-op Plan:   Post-operative Plan: Extubation in OR  Informed Consent: I have reviewed the patients History and Physical, chart, labs and discussed the procedure including the risks, benefits and alternatives for the proposed anesthesia with the patient or authorized representative who has indicated his/her understanding and acceptance.   Dental advisory given  Plan Discussed with: CRNA  Anesthesia Plan Comments:         Anesthesia Quick Evaluation

## 2017-11-28 NOTE — Progress Notes (Addendum)
ANTICOAGULATION CONSULT NOTE - Follow Up Consult  Pharmacy Consult for Heparin Indication: occluded fem-pop bypass  No Known Allergies  Patient Measurements: Height: 5\' 7"  (170.2 cm) Weight: 157 lb 3.2 oz (71.3 kg) IBW/kg (Calculated) : 61.6 Heparin Dosing Weight: 73.5 kg  Vital Signs: Temp: 101.1 F (38.4 C) (01/11 0011) Temp Source: Oral (01/11 0011) BP: 108/53 (01/11 0011) Pulse Rate: 119 (01/11 0011)  Labs: Recent Labs    11/26/17 0612 11/27/17 0500 11/27/17 1051 11/27/17 1802 11/28/17 0137  HGB 10.4*  --  11.5*  --  10.1*  HCT 33.7*  --  36.7  --  31.0*  PLT 283  --  338  --  285  LABPROT  --   --   --   --  16.1*  INR  --   --   --   --  1.30  HEPARINUNFRC 0.26* <0.10*  --  0.17* 0.50  CREATININE 0.96  --   --   --  1.07*    Estimated Creatinine Clearance: 59.1 mL/min (A) (by C-G formula based on SCr of 1.07 mg/dL (H)).   Medications:  Scheduled:  . aspirin EC  81 mg Oral Daily  . atorvastatin  80 mg Oral q1800  . azithromycin  500 mg Oral Daily  . docusate sodium  100 mg Oral BID  . doxycycline  100 mg Oral BID  . HYDROmorphone   Intravenous Q4H  . insulin aspart  0-9 Units Subcutaneous TID WC  . insulin glargine  28 Units Subcutaneous BID  . pantoprazole  40 mg Oral Daily  . pregabalin  100 mg Oral TID   Infusions:  . sodium chloride Stopped (11/24/17 2153)  .  ceFAZolin (ANCEF) IV    . 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  VVS reviewing for possible surgical intervention.    Heparin level therapeutic: 0.50 No overt bleeding documented; Hgb low, stable near baseline.  Noted plans for OR 1/11 at 1015 AM.  Goal of Therapy:  Heparin level 0.3-0.7 units/ml Monitor platelets by anticoagulation protocol: Yes   Plan:  Continue heparin at 1750 units/hr. Daily heparin level and CBC Monitor for s/sx of bleeding Order confirmatory heparin  level if pt doesn't go to OR F/U anticoagulation plan post surgery  Georga Bora, PharmD Clinical Pharmacist 11/28/2017 2:55 AM

## 2017-11-28 NOTE — Anesthesia Procedure Notes (Signed)
Central Venous Catheter Insertion Performed by: Oleta Mouse, MD, anesthesiologist Start/End1/09/2018 11:39 AM, 11/28/2017 11:39 AM Patient location: OR. Preanesthetic checklist: patient identified, IV checked, site marked, risks and benefits discussed, surgical consent, monitors and equipment checked, pre-op evaluation, timeout performed and anesthesia consent Hand hygiene performed  and maximum sterile barriers used  Catheter size: 9 Fr Total catheter length 10. MAC introducer Procedure performed using ultrasound guided technique. Ultrasound Notes:anatomy identified, needle tip was noted to be adjacent to the nerve/plexus identified, no ultrasound evidence of intravascular and/or intraneural injection and image(s) printed for medical record Attempts: 1 Following insertion, line sutured, dressing applied and Biopatch. Post procedure assessment: blood return through all ports, free fluid flow and no air  Patient tolerated the procedure well with no immediate complications.

## 2017-11-28 NOTE — Progress Notes (Addendum)
Pt in route to OR. Dilaudid IV through PCA discontinued. Wasted 5mg /5 cc of dilaudid and tubing content into the sharps container. Witnessed by [employer], Therapist, sports.

## 2017-11-28 NOTE — Progress Notes (Signed)
Inpatient Diabetes Program Recommendations  AACE/ADA: New Consensus Statement on Inpatient Glycemic Control (2015)  Target Ranges:  Prepandial:   less than 140 mg/dL      Peak postprandial:   less than 180 mg/dL (1-2 hours)      Critically ill patients:  140 - 180 mg/dL   Results for [name], [name] (MRN [id number]) as of 11/28/2017 09:31  Ref. Range 11/27/2017 08:28 11/27/2017 09:30 11/27/2017 11:07 11/27/2017 17:25 11/27/2017 21:27  Glucose-Capillary Latest Ref Range: 65 - 99 mg/dL 40 (LL) 93 165 (H) 128 (H) 109 (H)   Results for [name], [name] (MRN [id number]) as of 11/28/2017 09:31  Ref. Range 11/28/2017 07:57  Glucose-Capillary Latest Ref Range: 65 - 99 mg/dL 53 (L)     Outpatient DM meds: Lantus 36 units BID    Humalog 10 units tid  Current Insulin Orders: Lantus 28 units BID      Novolog Sensitive Correction Scale/ SSI (0-9 units) TID AC       MD- Note patient had Hypoglycemia yesterday AM.  Lantus reduced to 28 units BID.  Hypoglycemic again this AM.  Please consider reducing Lantus further to: Lantus 25 units BID (10% reduction)      --Will follow patient during hospitalization--  Wyn Quaker RN, MSN, CDE Diabetes Coordinator Inpatient Glycemic Control Team Team Pager: [phone] (8a-5p)

## 2017-11-28 NOTE — Anesthesia Procedure Notes (Signed)
Procedure Name: Intubation Date/Time: 11/28/2017 9:54 PM Performed by: Myna Bright, CRNA Pre-anesthesia Checklist: Patient identified, Emergency Drugs available, Suction available and Patient being monitored Patient Re-evaluated:Patient Re-evaluated prior to induction Oxygen Delivery Method: Circle system utilized Preoxygenation: Pre-oxygenation with 100% oxygen Induction Type: IV induction Ventilation: Mask ventilation without difficulty Laryngoscope Size: Mac and 3 Grade View: Grade I Tube type: Subglottic suction tube Tube size: 7.0 mm Number of attempts: 1 Airway Equipment and Method: Stylet Placement Confirmation: ETT inserted through vocal cords under direct vision,  positive ETCO2 and breath sounds checked- equal and bilateral Secured at: 22 cm Tube secured with: Tape Dental Injury: Teeth and Oropharynx as per pre-operative assessment

## 2017-11-28 NOTE — Progress Notes (Signed)
   Patient evaluated and noted to have no signals at ankle and foot is purple with limited capillary refill. Patient acknowledges pins and needles in the left foot. There is a popliteal signal. intraop angiogram reviewed with Dr. Trula Slade and findings discussed with family. Will return to OR to explore left fem-pop bypass graft, embolectomy and possible revision of the graft.   Cintia Gleed C. Donzetta Matters, MD [facility] Office: [phone] Pager: [phone]

## 2017-11-28 NOTE — Progress Notes (Signed)
Pt arrived to ICU at 8pm. Consistent with PACU RN's report, pt's left foot was pulseless, could only doppler popliteal on LLE. Left foot was noticeably more discolored/mottled/purple/red and more swollen then [name]. A small blood blister was starting to form on bottom of L foot.   Dr. Donzetta Matters came by during his evening rounds around 8:30pm and assessed the pt. The decision was made to go back to OR to try to correct blood flow to the L foot. Informed consent for procedure and blood consent forms completed and verified. Pt transported to OR on telemetry monitor with this RN and OR staff around 9pm. Handoff report given to [place].

## 2017-11-28 NOTE — Progress Notes (Signed)
Pt is NPO for surgery and  has low CBG at 53 this AM. Gave 1 amp of d 50% per protocol. Pt is asymptomatic.  Ferdinand Lango, RN

## 2017-11-28 NOTE — Anesthesia Preprocedure Evaluation (Signed)
Anesthesia Evaluation  Patient identified by MRN, date of birth, ID band Patient awake    Reviewed: Allergy & Precautions, NPO status , Patient's Chart, lab work & pertinent test results  History of Anesthesia Complications Negative for: history of anesthetic complications  Airway Mallampati: II  TM Distance: >3 FB Neck ROM: Full    Dental  (+) Poor Dentition, Missing, Chipped   Pulmonary asthma , neg sleep apnea, neg recent URI, former smoker,    breath sounds clear to auscultation       Cardiovascular (-) hypertension(-) angina+ Peripheral Vascular Disease  (-) CHF  Rhythm:Regular     Neuro/Psych PSYCHIATRIC DISORDERS Depression negative neurological ROS     GI/Hepatic Neg liver ROS, GERD  Medicated and Controlled,  Endo/Other  diabetes, Type 2, Insulin Dependent  Renal/GU ARFRenal disease     Musculoskeletal  (+) Arthritis ,   Abdominal   Peds  Hematology  (+) anemia ,   Anesthesia Other Findings   Reproductive/Obstetrics                             Anesthesia Physical Anesthesia Plan  ASA: III  Anesthesia Plan: General   Post-op Pain Management:    Induction: Intravenous  PONV Risk Score and Plan: 3 and Ondansetron and Dexamethasone  Airway Management Planned: Oral ETT  Additional Equipment: Arterial line, CVP and Ultrasound Guidance Line Placement  Intra-op Plan:   Post-operative Plan: Extubation in OR and Possible Post-op intubation/ventilation  Informed Consent: I have reviewed the patients History and Physical, chart, labs and discussed the procedure including the risks, benefits and alternatives for the proposed anesthesia with the patient or authorized representative who has indicated his/her understanding and acceptance.   Dental advisory given  Plan Discussed with: CRNA and Surgeon  Anesthesia Plan Comments:         Anesthesia Quick Evaluation

## 2017-11-28 NOTE — Transfer of Care (Signed)
Immediate Anesthesia Transfer of Care Note  Patient: [name]  Procedure(s) Performed: AORTA BIFEMORAL BYPASS USING HEMASHIELD GOLD GRAFT & REIMPLANT IMA (N/A ) APPLICATION OF WOUND VAC (Groin) INTRA OPERATIVE ARTERIOGRAM OF LEFT LEG THROMBECTOMY  & REVISION OF BILATERAL FEMORAL TO POPLETEAL ARTERIES  Patient Location: PACU  Anesthesia Type:General  Level of Consciousness: awake and patient cooperative  Airway & Oxygen Therapy: Patient Spontanous Breathing and Patient connected to face mask oxygen  Post-op Assessment: Report given to RN and Post -op Vital signs reviewed and stable  Post vital signs: Reviewed and stable  Last Vitals:  Vitals:   11/28/17 0414 11/28/17 0758  BP:  116/71  Pulse:    Resp: 11   Temp:  36.6 C  SpO2: 100% 94%    Last Pain:  Vitals:   11/28/17 0414  TempSrc:   PainSc: 8       Patients Stated Pain Goal: 3 (52/84/13 2440)  Complications: No apparent anesthesia complications

## 2017-11-28 NOTE — Anesthesia Procedure Notes (Signed)
Arterial Line Insertion Start/End1/09/2018 9:30 AM, 11/28/2017 9:45 AM Performed by: Glynda Jaeger, CRNA, CRNA  Patient location: Pre-op. Preanesthetic checklist: patient identified, IV checked, site marked, risks and benefits discussed, surgical consent, monitors and equipment checked, pre-op evaluation, timeout performed and anesthesia consent Lidocaine 1% used for infiltration Right, radial was placed Catheter size: 20 G Hand hygiene performed  and maximum sterile barriers used  Allen's test indicative of satisfactory collateral circulation Attempts: 2 Procedure performed without using ultrasound guided technique. Following insertion, dressing applied and Biopatch. Patient tolerated the procedure well with no immediate complications.

## 2017-11-28 NOTE — Anesthesia Procedure Notes (Signed)
Procedure Name: Intubation Date/Time: 11/28/2017 10:46 AM Performed by: Orlie Dakin, CRNA Pre-anesthesia Checklist: Patient identified, Emergency Drugs available, Suction available, Patient being monitored and Timeout performed Patient Re-evaluated:Patient Re-evaluated prior to induction Oxygen Delivery Method: Circle system utilized Preoxygenation: Pre-oxygenation with 100% oxygen Induction Type: IV induction Ventilation: Mask ventilation without difficulty Laryngoscope Size: Miller and 3 Grade View: Grade I Tube type: Oral Tube size: 7.0 mm Number of attempts: 1 Airway Equipment and Method: Stylet Placement Confirmation: ETT inserted through vocal cords under direct vision,  positive ETCO2 and breath sounds checked- equal and bilateral Secured at: 23 cm Tube secured with: Tape Dental Injury: Teeth and Oropharynx as per pre-operative assessment  Comments: Noted very poor condition of teeth, top, bottom of front, many missing, broken off, chipped.  None loose per patient report pre-op.

## 2017-11-28 NOTE — Op Note (Signed)
Patient name: [name] MRN: [id number] DOB: Sep 22, 1966 Sex: female  11/28/2017 Pre-operative Diagnosis:  bilateral lower extremity Ischemia Post-operative diagnosis:  Same Surgeon:  [name] Assistants:  Ruthe Mannan Procedure:   #1: Aortobifemoral bypass graft using a14 x 7 bifurcated [place]ron graft   #2: Reimplantation, inferior mesenteric artery   #3: Redo bilateral common femoral artery exposure   #4: Thrombectomy of bilateral femoral below-knee popliteal artery bypass graft   #5: Reimplantation of left femoral-popliteal bypass graft to the aortobifemoral graft   #6: Revision of proximal portion of right femoral popliteal bypass graft using a interposition 6 mm propatent Gore-Tex    graft   #7: Left lower extremity angiogram   #8: Intra-arterial administration of nitroglycerin   #9: Left common femoral and profunda femoral endarterectomy   #10: Aortic endarterectomy    #11:  Placement of bilateral femoral Provena wound vacs Anesthesia: General Blood Loss:  See anesthesia record.  2 units of packed red blood cells were given and 300 cc of Cell Saver Specimens: None  Findings: Both femoral-popliteal bypass graft proximal anastomosis were taken down so that the limbs of the aortobifemoral graft could be anastomosed to the common femoral artery.  On the left the anastomosis was to the distal common femoral artery.  On the right the anastomosis was to the distal common femoral artery and then out onto the profundofemoral artery for 1 cm.  On the left the bypass graft was reimplanted on the anterior surface of the left limb of the aortobifemoral graft.  On the right a interposition 6 mm Gore-Tex graft was used from the anterior surface of the right limb of the graft to the right femoral below-knee popliteal vein graft and a end to end fashion.  The aortic anastomosis was a end to end anastomosis.  The inferior mesenteric artery was reimplanted on the lateral aspect of  the tube portion of the aortic graft just proximal to the bifurcation.  Indications: The patient has a history of iliac stenting as well as bilateral femoral to below-knee popliteal artery bypass graft.  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  Her surgery was delayed because of a large sacral decubitus ulcer.  Procedure:  The patient was identified in the holding area and taken to [facility]  The patient was then placed supine on the table. general anesthesia was administered.  The patient was prepped and draped in the usual sterile fashion.  A time out was called and antibiotics were administered.  A longitudinal incision was first made in the left groin.  Cautery and sharp dissection were used to dissect out the common femoral artery, profundofemoral artery, and left femoral-popliteal vein graft.  There was dense scar tissue surrounding the artery.  The crossing circumflex iliac vein was identified under the inguinal ligament and divided.  Once adequate exposure was obtained, attention was turned towards the right groin.  A longitudinal incision was made in the right groin.  Again there was dense scar tissue in the right groin.  I was ultimately able to dissect out the common femoral artery profundofemoral artery and the vein graft.  I then divided the circumflex iliac vein under the inguinal ligament with silk ties.  Once adequate exposure was obtained, attention was turned towards the abdomen  A midline incision  was made from the xiphoid to below the umbilicus.  Cautery was used to divide the subcutaneous tissue down to the fascia.  The fascia was opened with cautery.  I then entered the peritoneal space sharply and then opened the peritoneum throughout the length of the incision.  The falciform ligament was divided between silk ties.  A Balfour  and Omni-Tract retractor were used for exposure.  The abdomen was briefly inspected and no gross pathology was identified.  The transverse colon was reflected cephalad and the small bowel was mobilized to the patient's right.  The ligament of Treitz was taken down with cautery and sharp dissection.  The inferior mesenteric vein was divided.  I then obtained circumferential exposure of the aorta at the level of the renal arteries.  I then dissected out the remaining portion of the aorta including the inferior mesenteric artery.  Identified the aortic bifurcation as well as bilateral common iliac arteries.  I then created a tunnel between the groin incision and the abdominal incision on both sides, creating the tunnel posterior to the ureters.  At this point, the patient was fully heparinized.  Heparin levels were monitored with ACT levels and redosed appropriately.  Next, the infrarenal abdominal aorta was occluded with a Harken clamp at the level of the lower left renal artery.  A Zanger clamp was then used to occlude the distal aorta.  A #11 blade was used to perform an aortic arteriotomy.  The aorta was then transected.  I performed a endarterectomy of the distal aorta, proximal to the inferior mesenteric artery.  The aortic stump was then oversewn with 3-0 Prolene in 2 layers.  I then performed an extensive aortic endarterectomy up to the clamp.  A 14 x 7 bifurcated [place] graft was then selected.  The proximal anastomosis was performed with a running 3-0 Prolene suture incorporating a felt strip.  One repair stitch was required on the posterior wall for hemostasis.  The clamp was then released and there was excellent flow through the graft.  Each limb of the graft was brought through the previously created tunnels.  Dr. early came in at this point and helped with the reconstruction of the left femoral artery.  We made a arteriotomy in the vein graft on the distal common femoral artery.  The vein graft was  transected and the arteriotomy was opened down to the superficial femoral artery.  A endarterectomy of the common femoral and profunda femoral artery was performed.  There was excellent backbleeding through the profunda.  Multiple passes of a #4 Fogarty catheter were then performed to thrombectomized the left femoral below-knee popliteal artery bypass graft.  The catheter was able to be fully held.  Multiple negative passes were performed and there was excellent backbleeding through the graft.  The left limb of the graft was then spatulated to fit the size of the arteriotomy and a running anastomosis was created with 5-0 Prolene.  The anastomosis was tested and found to be hemostatic.  The limb of the graft was then reoccluded.  A graftotomy was made on the anterior surface of the left limb of the graft.  The vein graft was then plugged into this graftotomy with running 5-0 Prolene suture.  Prior to completion of the anastomosis, appropriate flushing maneuvers were performed and the anastomosis was completed and blood flow was reestablished to the left leg  Next attention was turned towards the right groin.  The common femoral and profunda femoral artery were occluded.  A #  11 blade was used to make an arteriotomy in the common femoral artery which was brought down onto the profundofemoral artery for approximately 1 cm.  The right limb of the graft was cut to the appropriate length and a running anastomosis was created and a end-to-side fashion.  Prior to completion the appropriate flushing maneuvers were performed and the anastomosis was completed.  I then transected the femoral-popliteal graft so as to exclude the stent which was in the proximal portion of the vein bypass.  There was clot within the graft and a #4 Fogarty catheter was used to perform thrombectomy of the graft.  There was excellent backbleeding once thrombectomy was performed.  Because I had to resect the proximal portion of the graft, there was  not enough length to plug the vein graft directly into the right limb of the aortobifemoral graft.  Therefore I selected a 6 mm propatent Gore-Tex graft to perform an interposition graft.  I then reoccluded the right limb of the graft and made a graftotomy on the anterior surface.  This was opened slightly with Metzenbaum scissors to create a wide opening.  I performed a end to side anastomosis between the dacryon graft and the 6 mm Gore-Tex graft with a running 6-0 Prolene.  A end to end it spatulated anastomosis was then created between the distal end of the Gore-Tex graft and the left femoral-popliteal vein graft with 6-0 Prolene.  Just prior to completion the appropriate flushing maneuvers were performed and the anastomosis was completed.  At this point I had a brisk anterior tibial Doppler signal on the right.  I was unable to get Doppler signals on the left but felt this may be secondary to spasm so I decided to return to the abdomen and evaluate the left leg later.  Attention was then turned back towards the abdomen.  I fully mobilized the inferior mesenteric artery which was calcified.  I ligated it at its origin.  There was moderate backbleeding from the anterior mesenteric artery, however I felt that it needed to be reimplanted.  I had to reocclude the aortic graft because the inferior mesenteric artery needed to be plugged into the left lateral side of the tube portion of the graft.  I made a graftotomy on the left lateral side of the graft and open this further with a #5 punch.  A end-to-side anastomosis was then created between the tube portion of the dacryon graft and the inferior mesenteric artery.  This was done with 6-0 Prolene.  Once the anastomosis was appropriately flushed, it was completed and the clamps were released.  There was a good Doppler signal within the inferior mesenteric artery.  Attention was then turned back towards the legs.  I continued to have a brisk Doppler signal in the  right anterior tibial artery and a faint signal on the left.  Therefore I elected to perform an arteriogram on the left.  A micropuncture sheath was advanced into the vein graft on the left and contrast injections were performed.  This revealed that the vein graft was patent throughout its course.  There appeared to be two-vessel runoff via the anterior tibial and peroneal artery.  The anterior tibial appeared to cross the ankle.  The posterior tibial artery appeared to occlude just beyond its origin.  There appeared to be significant spasm in the tibial vessels and therefore I elected not to proceed any further with revascularization but administered 600 mcg of nitroglycerin through the vein graft.  The  sheath was removed and the venotomy was closed with 6-0 Prolene.  I listen to the anterior tibial artery at this point and I was able to find a graft dependent monophasic signal.  I was satisfied with these results.  50 mg of protamine was then given.  The abdomen was irrigated.  It was hemostatic.  The retroperitoneum was then reapproximated with running 2-0 Vicryl.  The small bowel was placed back into its anatomic position.  The transverse colon was brought back down the retractors were removed.  I ran the small bowel and there were no defects.  I then reapproximated the fascia with 2 running #1 PDS suture.  The subtendinous tissue was closed with running 2-0 Vicryl.  The skin was closed with a running 4-0 Vicryl and Dermabond was applied.  Next attention was turned to both groins.  Hemostasis was achieved.  I then reapproximated the femoral sheath bilaterally with 2-0 Vicryl.  Subcutaneous tissues were closed with multiple layers of 2-0 and 3-0 Vicryl and the skin was closed with 4-0 Vicryl.  Provena incisional wound vacs were placed.  The patient was then successfully extubated and taken to the recovery room in stable condition   Disposition: To PACU stable   V. [name], M.D. [facility] [place] Office: [phone] Pager:  [phone]

## 2017-11-29 DIAGNOSIS — I998 Other disorder of circulatory system: Secondary | ICD-10-CM

## 2017-11-29 LAB — CBC
HEMATOCRIT: 27.9 % — AB (ref 36.0–46.0)
HEMOGLOBIN: 9.3 g/dL — AB (ref 12.0–15.0)
MCH: 27.7 pg (ref 26.0–34.0)
MCHC: 33.3 g/dL (ref 30.0–36.0)
MCV: 83 fL (ref 78.0–100.0)
Platelets: 221 10*3/uL (ref 150–400)
RBC: 3.36 MIL/uL — ABNORMAL LOW (ref 3.87–5.11)
RDW: 14.1 % (ref 11.5–15.5)
WBC: 13.8 10*3/uL — ABNORMAL HIGH (ref 4.0–10.5)

## 2017-11-29 LAB — POCT I-STAT 3, ART BLOOD GAS (G3+)
ACID-BASE DEFICIT: 6 mmol/L — AB (ref 0.0–2.0)
Bicarbonate: 19 mmol/L — ABNORMAL LOW (ref 20.0–28.0)
O2 Saturation: 98 %
PH ART: 7.326 — AB (ref 7.350–7.450)
Patient temperature: 98.1
TCO2: 20 mmol/L — ABNORMAL LOW (ref 22–32)
pCO2 arterial: 36.4 mmHg (ref 32.0–48.0)
pO2, Arterial: 119 mmHg — ABNORMAL HIGH (ref 83.0–108.0)

## 2017-11-29 LAB — GLUCOSE, CAPILLARY
GLUCOSE-CAPILLARY: 174 mg/dL — AB (ref 65–99)
GLUCOSE-CAPILLARY: 155 mg/dL — AB (ref 65–99)
GLUCOSE-CAPILLARY: 152 mg/dL — AB (ref 65–99)
Glucose-Capillary: 140 mg/dL — ABNORMAL HIGH (ref 65–99)
Glucose-Capillary: 198 mg/dL — ABNORMAL HIGH (ref 65–99)
Glucose-Capillary: 217 mg/dL — ABNORMAL HIGH (ref 65–99)

## 2017-11-29 LAB — COMPREHENSIVE METABOLIC PANEL
ALK PHOS: 94 U/L (ref 38–126)
ALT: 12 U/L — ABNORMAL LOW (ref 14–54)
ANION GAP: 9 (ref 5–15)
AST: 37 U/L (ref 15–41)
Albumin: 1.9 g/dL — ABNORMAL LOW (ref 3.5–5.0)
BILIRUBIN TOTAL: 0.5 mg/dL (ref 0.3–1.2)
BUN: 14 mg/dL (ref 6–20)
CALCIUM: 7.5 mg/dL — AB (ref 8.9–10.3)
CO2: 19 mmol/L — ABNORMAL LOW (ref 22–32)
Chloride: 106 mmol/L (ref 101–111)
Creatinine, Ser: 0.85 mg/dL (ref 0.44–1.00)
GFR calc Af Amer: >60 mL/min (ref >60)
Glucose, Bld: 160 mg/dL — ABNORMAL HIGH (ref 65–99)
POTASSIUM: 4 mmol/L (ref 3.5–5.1)
Sodium: 134 mmol/L — ABNORMAL LOW (ref 135–145)
TOTAL PROTEIN: 4.2 g/dL — AB (ref 6.5–8.1)

## 2017-11-29 LAB — POCT I-STAT 4, (NA,K, GLUC, HGB,HCT)
Glucose, Bld: 203 mg/dL — ABNORMAL HIGH (ref 65–99)
HCT: 31 % — ABNORMAL LOW (ref 36.0–46.0)
HEMOGLOBIN: 10.5 g/dL — AB (ref 12.0–15.0)
Potassium: 3.6 mmol/L (ref 3.5–5.1)
SODIUM: 137 mmol/L (ref 135–145)

## 2017-11-29 LAB — LACTIC ACID, PLASMA
Lactic Acid, Venous: 3.8 mmol/L (ref 0.5–1.9)
Lactic Acid, Venous: 2.5 mmol/L (ref 0.5–1.9)
Lactic Acid, Venous: 1.8 mmol/L (ref 0.5–1.9)

## 2017-11-29 LAB — AMYLASE: Amylase: 174 U/L — ABNORMAL HIGH (ref 28–100)

## 2017-11-29 LAB — MAGNESIUM: MAGNESIUM: 1.1 mg/dL — AB (ref 1.7–2.4)

## 2017-11-29 LAB — HEPARIN LEVEL (UNFRACTIONATED): Heparin Unfractionated: <0.1 IU/mL — ABNORMAL LOW (ref 0.30–0.70)

## 2017-11-29 MED ORDER — PROMETHAZINE HCL 25 MG/ML IJ SOLN
6.2500 mg | Freq: Four times a day (QID) | INTRAMUSCULAR | Status: AC | PRN
  Administered 2017-11-29: 6.25 mg via INTRAVENOUS

## 2017-11-29 MED ORDER — ALBUMIN HUMAN 5 % IV SOLN
12.5000 g | Freq: Once | INTRAVENOUS | Status: AC
  Administered 2017-11-29: 12.5 g via INTRAVENOUS

## 2017-11-29 MED ORDER — HEPARIN (PORCINE) IN NACL 100-0.45 UNIT/ML-% IJ SOLN
INTRAMUSCULAR | Status: AC
  Administered 2017-11-29: 500 [IU]/h via INTRAVENOUS
  Filled 2017-11-29: qty 250

## 2017-11-29 MED ORDER — PROMETHAZINE HCL 25 MG/ML IJ SOLN
INTRAMUSCULAR | Status: AC
  Administered 2017-11-29: 6.25 mg via INTRAVENOUS
  Filled 2017-11-29: qty 1

## 2017-11-29 MED ORDER — HYDROMORPHONE HCL 1 MG/ML IJ SOLN
0.2500 mg | INTRAMUSCULAR | Status: DC | PRN
  Administered 2017-11-29: 0.5 mg via INTRAVENOUS

## 2017-11-29 MED ORDER — HEPARIN (PORCINE) IN NACL 100-0.45 UNIT/ML-% IJ SOLN
1750.0000 [IU]/h | INTRAMUSCULAR | Status: DC
  Administered 2017-11-29 – 2017-11-30 (×2): 1200 [IU]/h via INTRAVENOUS
  Administered 2017-11-30: 1650 [IU]/h via INTRAVENOUS
  Administered 2017-12-01 – 2017-12-07 (×9): 1750 [IU]/h via INTRAVENOUS
  Filled 2017-11-29 (×12): qty 250

## 2017-11-29 MED ORDER — SUGAMMADEX SODIUM 500 MG/5ML IV SOLN
INTRAVENOUS | Status: AC
  Filled 2017-11-29: qty 5

## 2017-11-29 MED ORDER — ONDANSETRON HCL 4 MG/2ML IJ SOLN
INTRAMUSCULAR | Status: DC | PRN
  Administered 2017-11-29: 4 mg via INTRAVENOUS

## 2017-11-29 MED ORDER — INSULIN ASPART 100 UNIT/ML ~~LOC~~ SOLN
0.0000 [IU] | Freq: Three times a day (TID) | SUBCUTANEOUS | Status: DC
  Administered 2017-11-29: 3 [IU] via SUBCUTANEOUS
  Administered 2017-11-30 – 2017-12-01 (×2): 1 [IU] via SUBCUTANEOUS
  Administered 2017-12-01 – 2017-12-03 (×3): 2 [IU] via SUBCUTANEOUS
  Administered 2017-12-04 – 2017-12-05 (×2): 1 [IU] via SUBCUTANEOUS
  Administered 2017-12-05: 2 [IU] via SUBCUTANEOUS
  Administered 2017-12-05 – 2017-12-06 (×3): 1 [IU] via SUBCUTANEOUS
  Administered 2017-12-06: 2 [IU] via SUBCUTANEOUS
  Administered 2017-12-07: 3 [IU] via SUBCUTANEOUS
  Administered 2017-12-07 (×2): 2 [IU] via SUBCUTANEOUS
  Administered 2017-12-08: 5 [IU] via SUBCUTANEOUS
  Administered 2017-12-08: 3 [IU] via SUBCUTANEOUS
  Administered 2017-12-08 – 2017-12-09 (×4): 5 [IU] via SUBCUTANEOUS

## 2017-11-29 MED ORDER — HEPARIN (PORCINE) IN NACL 100-0.45 UNIT/ML-% IJ SOLN: 1000.0000 [IU]/h | INTRAMUSCULAR | Status: DC

## 2017-11-29 MED ORDER — SODIUM CHLORIDE 0.9 % IV BOLUS (SEPSIS)
500.0000 mL | Freq: Once | INTRAVENOUS | Status: AC | PRN
  Administered 2017-11-29: 500 mL via INTRAVENOUS

## 2017-11-29 MED ORDER — SUGAMMADEX SODIUM 200 MG/2ML IV SOLN
INTRAVENOUS | Status: DC | PRN
  Administered 2017-11-29: 200 mg via INTRAVENOUS

## 2017-11-29 MED ORDER — SODIUM CHLORIDE 0.9 % IV BOLUS (SEPSIS)
1000.0000 mL | Freq: Once | INTRAVENOUS | Status: AC
  Administered 2017-11-29: 1000 mL via INTRAVENOUS

## 2017-11-29 MED ORDER — HYDROMORPHONE HCL 1 MG/ML IJ SOLN
INTRAMUSCULAR | Status: AC
  Filled 2017-11-29: qty 1

## 2017-11-29 MED ORDER — PHENYLEPHRINE HCL 10 MG/ML IJ SOLN
INTRAMUSCULAR | Status: DC | PRN
  Administered 2017-11-29: 120 ug via INTRAVENOUS
  Administered 2017-11-29 (×2): 80 ug via INTRAVENOUS
  Administered 2017-11-29 (×2): 40 ug via INTRAVENOUS

## 2017-11-29 MED ORDER — ALBUMIN HUMAN 5 % IV SOLN
INTRAVENOUS | Status: AC
  Administered 2017-11-29: 12.5 g via INTRAVENOUS
  Filled 2017-11-29: qty 250

## 2017-11-29 MED ORDER — HEPARIN (PORCINE) IN NACL 100-0.45 UNIT/ML-% IJ SOLN
500.0000 [IU]/h | INTRAMUSCULAR | Status: DC
  Administered 2017-11-29: 500 [IU]/h via INTRAVENOUS

## 2017-11-29 NOTE — Transfer of Care (Signed)
Immediate Anesthesia Transfer of Care Note  Patient: [name]  Procedure(s) Performed: Left Lower Extremity Embolectomy, Left Tibial Peroneal Trunk Endarterectomy with Patch Angioplasty; Vein Harvest Small Saphenous Graft Left Lower Leg (Left Leg Lower)  Patient Location: PACU  Anesthesia Type:General  Level of Consciousness: awake, alert  and oriented  Airway & Oxygen Therapy: Patient connected to face mask oxygen  Post-op Assessment: Report given to RN and Post -op Vital signs reviewed and stable  Post vital signs: Reviewed and stable  Last Vitals:  Vitals:   11/28/17 2000 11/28/17 2100  BP:    Pulse: 72 80  Resp: 17 (!) 21  Temp:  (!) 34.5 C  SpO2: 96% 99%    Last Pain:  Vitals:   11/28/17 2100  TempSrc: Axillary  PainSc:       Patients Stated Pain Goal: 3 (01/00/71 2197)  Complications: No apparent anesthesia complications

## 2017-11-29 NOTE — Progress Notes (Signed)
Dr. Donzetta Matters notified of loss of left DP pulse. Pt has doppler left PT. Color a little more purple than on arrival to ICU at 0430, but still has cap refill.   Given orders to start 500 units/hr heparin drip and to keep feet warm.  Will continue to monitor closely.

## 2017-11-29 NOTE — Progress Notes (Signed)
ANTICOAGULATION CONSULT NOTE - Follow Up Consult  Pharmacy Consult for Heparin Indication: occluded fem-pop bypass  No Known Allergies  Patient Measurements: Height: 5\' 7"  (170.2 cm) Weight: 166 lb 1.6 oz (75.3 kg) IBW/kg (Calculated) : 61.6 Heparin Dosing Weight: 73.5 kg  Vital Signs: Temp: 98.3 F (36.8 C) (01/12 0723) Temp Source: Oral (01/12 0723) Pulse Rate: 95 (01/12 1100)  Labs: Recent Labs    11/27/17 0500  11/27/17 1802 11/28/17 0137  11/28/17 1900 11/28/17 2232 11/29/17 0425  HGB  --    < >  --  10.1*   < > 11.5* 10.5* 9.3*  HCT  --    < >  --  31.0*   < > 33.8* 31.0* 27.9*  PLT  --    < >  --  285  --  239  --  221  APTT  --   --   --   --   --  42*  --   --   LABPROT  --   --   --  16.1*  --  17.0*  --   --   INR  --   --   --  1.30  --  1.40  --   --   HEPARINUNFRC <0.10*  --  0.17* 0.50  --   --   --   --   CREATININE  --   --   --  1.07*  --  0.70  --  0.85   < > = values in this interval not displayed.    Estimated Creatinine Clearance: 81.1 mL/min (by C-G formula based on SCr of 0.85 mg/dL).   Medications:  Scheduled:  . docusate sodium  100 mg Oral Daily  . HYDROmorphone      . pantoprazole  40 mg Oral Daily   Infusions:  . sodium chloride    . cefUROXime (ZINACEF)  IV Stopped (11/29/17 1155)  . dextrose 5 % and 0.45 % NaCl with KCl 20 mEq/L 125 mL/hr at 11/29/17 1000  . 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  VVS reviewing for possible surgical intervention > went back to OR last pm for redo fem-pop.  Foot remains purple today but slightly improved.  Heparin drip 500 uts/hr overnight, increase to full dose 1/12.    Goal of Therapy:  Heparin level 0.3-0.7 units/ml Monitor platelets by anticoagulation protocol: Yes   Plan:  Increase heparin at 1000 units/hr HL 6hr from increase. Daily heparin level and CBC Monitor for s/sx of bleeding   Bonnita Nasuti  Pharm.D. CPP, BCPS Clinical Pharmacist [phone] 11/29/2017 12:38 PM

## 2017-11-29 NOTE — Evaluation (Signed)
Physical Therapy Evaluation Patient Details Name: [name] MRN: [id number] DOB: Apr 06, 1966 Today's Date: 11/29/2017   History of Present Illness  52 year old female with a history of bilateral lower extremity femoral popliteal artery bypasses with vein.  These are both occluded and she also had occluded iliac systems bilaterally.  On 11/28/17 she underwent aortobifemoral bypass grafting as well as revision of the graft on the right and embolectomy of the graft on the left.  At completion the patient had left sided peroneal signal reportedly.  Back to surgery for left embolectomy and  Left popliteal and tibioperoneal trunk endarterectomy and patch angioplasty.  PMH: asthma, depression, diabetes  Clinical Impression  Pt admitted with above diagnosis. Pt currently with functional limitations due to the deficits listed below (see PT Problem List). Pt was able to sit EOB only 10 seconds feeling dizzy and feeling nauseaous.  Laid pt back down and pt began vomiting.  Notified nursing and he came to give nausea meds.  [place] o[place]ow acutely and progress as pt tolerates. Pt will benefit from skilled PT to increase their independence and safety with mobility to allow discharge to the venue listed below.      Follow Up Recommendations SNF;Supervision/Assistance - 24 hour(If progresses, will change to [facility] as she has 24 hour care)    Equipment Recommendations  Other (comment)(TBA)    Recommendations for Other Services       Precautions / Restrictions Precautions Precautions: Fall Restrictions Weight Bearing Restrictions: No      Mobility  Bed Mobility Overal bed mobility: Needs Assistance Bed Mobility: Supine to Sit     Supine to sit: Max assist;+2 for physical assistance;HOB elevated     General bed mobility comments: Helicopter method using pad due to abdominal pain.  Pt immediatly states she is dizzy.  Attempts to get her to focus and move UEs unsuccessful and pt has had low BP therefore  assisted back to supine.  Pt began vomiting once back in bed.  Nursing made aware and bringing nausea med.    Transfers                 General transfer comment: unable  Ambulation/Gait             General Gait Details: unable  Stairs            Wheelchair Mobility    Modified Rankin (Stroke Patients Only)       Balance Overall balance assessment: Needs assistance Sitting-balance support: Bilateral upper extremity supported;Feet supported Sitting balance-Leahy Scale: Poor Sitting balance - Comments: relies on UE support.  only able to tolerate 20 seconds at EOB and had to lie back down due to dizziness and nausea.                                      Pertinent Vitals/Pain Pain Assessment: Faces Faces Pain Scale: Hurts worst Pain Location: abdomen, groin bilaterally, left LE Pain Descriptors / Indicators: Aching;Grimacing;Guarding;Operative site guarding Pain Intervention(s): Limited activity within patient's tolerance;Monitored during session;Repositioned  BP 73/42 -96/48 while talking with pt with nurse stating it has been inaccurate at times.  Decided to proceed and see if pt could tolerate to EOB.  Pt too dizzy and nauseous.  BP 128/52 after lying pt back in bed.    Home Living Family/patient expects to be discharged to:: Private residence Living Arrangements: Parent;Spouse/significant other(husband, father in law and brother)  Available Help at Discharge: Family;Available 24 hours/day(husband works) Type of Home: House Home Access: [place]: One [place]: Environmental consultant - 2 wheels;Cane - single point;Bedside commode;Walker - 4 wheels;Shower seat      Prior Function Level of Independence: Independent;Independent with assistive device(s)         Comments: amb with cane and sometimes rollator     Hand Dominance   Dominant Hand: Right    Extremity/Trunk Assessment   Upper Extremity Assessment Upper  Extremity Assessment: Defer to OT evaluation    Lower Extremity Assessment Lower Extremity Assessment: [name] deficits/detail;LLE deficits/detail [name] Deficits / Details: grossly 3+/5 LLE Deficits / Details: Pt too sore to test.  appears grossly 2-/5 LLE: Unable to fully assess due to pain    Cervical / Trunk Assessment Cervical / Trunk Assessment: Normal  Communication   Communication: No difficulties  Cognition Arousal/Alertness: Lethargic Behavior During Therapy: Flat affect;Anxious Overall Cognitive Status: Within Functional Limits for tasks assessed                                        General Comments General comments (skin integrity, edema, etc.): Multiple abrasions LEs    Exercises     Assessment/Plan    PT Assessment Patient needs continued PT services  PT Problem List Decreased strength;Decreased activity tolerance;Decreased balance;Decreased range of motion;Decreased mobility;Decreased knowledge of use of DME;Decreased safety awareness;Decreased knowledge of precautions;Cardiopulmonary status limiting activity;Pain       PT Treatment Interventions DME instruction;Gait training;Functional mobility training;Therapeutic activities;Therapeutic exercise;Balance training;Patient/family education    PT Goals (Current goals can be found in the Care Plan section)  Acute Rehab PT Goals Patient Stated Goal: to go home PT Goal Formulation: With patient Time For Goal Achievement: 12/13/17 Potential to Achieve Goals: Good    Frequency Min 3X/week   Barriers to discharge        Co-evaluation               AM-PAC PT "6 Clicks" Daily Activity  Outcome Measure Difficulty turning over in bed (including adjusting bedclothes, sheets and blankets)?: Unable Difficulty moving from lying on back to sitting on the side of the bed? : Unable Difficulty sitting down on and standing up from a chair with arms (e.g., wheelchair, bedside commode, etc,.)?:  Unable Help needed moving to and from a bed to chair (including a wheelchair)?: Total Help needed walking in hospital room?: Total Help needed climbing 3-5 steps with a railing? : Total 6 Click Score: 6    End of Session   Activity Tolerance: Patient limited by fatigue;Patient limited by pain Patient left: with call bell/phone within reach;in bed;with bed alarm set;with family/visitor present Nurse Communication: Mobility status;Need for lift equipment(pt request nausea meds, wanted pain meds but BP too low) PT Visit Diagnosis: Unsteadiness on feet (R26.81);Muscle weakness (generalized) (M62.81);Pain Pain - part of body: (groin bilaterally, left LE)    Time: 3299-2426 PT Time Calculation (min) (ACUTE ONLY): 20 min   Charges:   PT Evaluation $PT Eval Moderate Complexity: 1 Mod     PT G Codes:        Yanira Tolsma,PT Acute Rehabilitation [phone] (pager)   Denice Paradise 11/29/2017, 3:56 PM

## 2017-11-29 NOTE — Progress Notes (Signed)
Progress Note    11/29/2017 10:13 AM 1 Day Post-Op  Subjective:  Complains of abdominal pain  Vitals:   11/29/17 0723 11/29/17 0800  BP:    Pulse: 92 96  Resp: 18 (!) 21  Temp: 98.3 F (36.8 C)   SpO2: 97% 97%    Physical Exam: Aaox3, somewhat drowsy Non labored respirations, on 2L [place] Abdomen is soft, midline incision cdi Bilateral groins with prevena vacs Strong Right AT signal Left bk incisions with sterile dressings Strong pt signal, very weak monophasic dp is only signal in foot Capillary refill on left foot is sluggish distally     CBC    Component Value Date/Time   WBC 13.8 (H) 11/29/2017 0425   RBC 3.36 (L) 11/29/2017 0425   HGB 9.3 (L) 11/29/2017 0425   HCT 27.9 (L) 11/29/2017 0425   PLT 221 11/29/2017 0425   MCV 83.0 11/29/2017 0425   MCH 27.7 11/29/2017 0425   MCHC 33.3 11/29/2017 0425   RDW 14.1 11/29/2017 0425   LYMPHSABS 2,760 08/12/2017 1530   MONOABS 558 05/12/2017 1034   EOSABS 200 08/12/2017 1530   BASOSABS 37 08/12/2017 1530    BMET    Component Value Date/Time   NA 134 (L) 11/29/2017 0425   K 4.0 11/29/2017 0425   CL 106 11/29/2017 0425   CO2 19 (L) 11/29/2017 0425   GLUCOSE 160 (H) 11/29/2017 0425   BUN 14 11/29/2017 0425   CREATININE 0.85 11/29/2017 0425   CREATININE 1.24 (H) 08/12/2017 1530   CALCIUM 7.5 (L) 11/29/2017 0425   GFRNONAA >60 11/29/2017 0425   GFRNONAA 50 (L) 08/12/2017 1530   GFRAA >60 11/29/2017 0425   GFRAA 57 (L) 08/12/2017 1530    INR    Component Value Date/Time   INR 1.40 11/28/2017 1900     Intake/Output Summary (Last 24 hours) at 11/29/2017 1013 Last data filed at 11/29/2017 0800 Gross per 24 hour  Intake 6907.17 ml  Output 3205 ml  Net 3702.17 ml     Assessment:  52 y.o. [name] is s/p:                       #1: Aortobifemoral bypass graft using a14 x 7 bifurcated Dacron graft                         #2: Reimplantation, inferior mesenteric artery                         #3: Redo  bilateral common femoral artery exposure                         #4: Thrombectomy of bilateral femoral below-knee popliteal artery bypass graft                         #5: Reimplantation of left femoral-popliteal bypass graft to the aortobifemoral graft                         #6: Revision of proximal portion of right femoral popliteal bypass graft using a interposition 6 mm propatent Gore-Tex                                graft                         #  7: Left lower extremity angiogram                         #8: Intra-arterial administration of nitroglycerin                         #9: Left common femoral and profunda femoral endarterectomy                         #10: Aortic endarterectomy                             #11:  Placement of bilateral femoral Provena wound vacs   and subsequent  1.  Re-exposure of left below knee arteries > 30 days 2.  Embolectomy of left lower extremity 3.  Left popliteal and tibioperoneal trunk endarterectomy and patch angioplasty 4.  Harvest of left lesser saphenous vein  Plan: Neuro: intermittent dilaudid iv pain control Pulm: needs incentive spirometry, stable on nasal cannula GI: continue ng tube to lws, ok for a few ice chips FEN: adequate uop, recheck lactate, continue ivf at 125 Heme/ID: no abx, h/h within reason as she is hd stable. She was transfused during first procedure yesterday Dispo: continue icu care, needs oob today PPx: transition to full dose heparin given appearance of foot and concern for re-thrombosis.    Kearra Calkin C. Donzetta Matters, MD [facility] [place] Office: [phone] Pager: [phone]  11/29/2017 10:13 AM

## 2017-11-29 NOTE — Anesthesia Postprocedure Evaluation (Signed)
Anesthesia Post Note  Patient: [name]  Procedure(s) Performed: Left Lower Extremity Embolectomy, Left Tibial Peroneal Trunk Endarterectomy with Patch Angioplasty; Vein Harvest Small Saphenous Graft Left Lower Leg (Left Leg Lower)     Patient location during evaluation: PACU Anesthesia Type: General Level of consciousness: awake and alert Pain management: pain level controlled Vital Signs Assessment: post-procedure vital signs reviewed and stable Respiratory status: spontaneous breathing, nonlabored ventilation, respiratory function stable and patient connected to nasal cannula oxygen Cardiovascular status: blood pressure returned to baseline and stable Postop Assessment: no apparent nausea or vomiting Anesthetic complications: no    Last Vitals:  Vitals:   11/29/17 0723 11/29/17 0800  BP:    Pulse: 92 96  Resp: 18 (!) 21  Temp: 36.8 C   SpO2: 97% 97%    Last Pain:  Vitals:   11/29/17 0723  TempSrc: Oral  PainSc:                  Harryette Shuart,W. EDMOND

## 2017-11-29 NOTE — Anesthesia Postprocedure Evaluation (Signed)
Anesthesia Post Note  Patient: [name]  Procedure(s) Performed: AORTA BIFEMORAL BYPASS USING HEMASHIELD GOLD GRAFT & REIMPLANT IMA (N/A ) APPLICATION OF WOUND VAC (Groin) INTRA OPERATIVE ARTERIOGRAM OF LEFT LEG THROMBECTOMY  & REVISION OF BILATERAL FEMORAL TO POPLETEAL ARTERIES     Patient location during evaluation: PACU Anesthesia Type: General Level of consciousness: awake and alert Pain management: pain level controlled Vital Signs Assessment: post-procedure vital signs reviewed and stable Respiratory status: spontaneous breathing, nonlabored ventilation, respiratory function stable and patient connected to nasal cannula oxygen Cardiovascular status: blood pressure returned to baseline and stable Postop Assessment: no apparent nausea or vomiting Anesthetic complications: no    Last Vitals:  Vitals:   11/29/17 0723 11/29/17 0800  BP:    Pulse: 92 96  Resp: 18 (!) 21  Temp: 36.8 C   SpO2: 97% 97%    Last Pain:  Vitals:   11/29/17 0723  TempSrc: Oral  PainSc:                  [name]

## 2017-11-29 NOTE — Op Note (Signed)
Patient name: [name] MRN: [id number] DOB: January 06, 1966 Sex: female  11/29/2017 Pre-operative Diagnosis: acute left lower extremity ischemia Post-operative diagnosis:  Same Surgeon:  Erlene Quan C. Donzetta Matters, MD Assistant: [name], PA Procedure Performed: 1.  Re-exposure of left below knee arteries > 30 days 2.  Embolectomy of left lower extremity 3.  Left popliteal and tibioperoneal trunk endarterectomy and patch angioplasty 4.  Harvest of left lesser saphenous vein   Indications: 52 year old female with a history of bilateral lower extremity femoral popliteal artery bypasses with vein.  These are both occluded and she also had occluded iliac systems bilaterally.  Earlier today she underwent aortobifemoral bypass grafting as well as revision of the graft on the right and embolectomy of the graft on the left.  At completion the patient had left sided peroneal signal reportedly.  In the ICU she was noted to have no signals and was having pins and needle feeling in the left foot and purple discoloration with minimal capillary refill.  For this reason she was indicated for return to the OR for revascularization of the left lower extremity.  Findings: There was significant scar tissue in the left below-knee incision site.  We were able to expose all of the tibial vessels as well as the popliteal artery and previous vein bypass graft.  There was significant calcification in the popliteal artery and tibioperoneal trunk.  Initially we attempted embolectomy this was unsuccessful.  We then performed endarterectomy via arteriotomy extending from the bypass down to the level of the bifurcation of the tibioperoneal trunk.  We then harvested the saphenous vein which was noted to be 2.5 mm approximately in diameter blood loss was minimal too small to fashion a bypass.  This was opened longitudinally and used as a patch angioplasty from the previous bypass graft including all 3 tibial vessels.  At completion  posterior tibial artery was the dominant signal at the level of the ankle and there was capillary refill in the foot.  There is a high resistance signal in both the anterior tibial and peroneal signals within the wound bed.   Procedure:  The patient was identified in the holding area and taken to the operating room where she was placed supine on the operating table and general endotracheal anesthesia was induced.  She was sterilely prepped and draped left lower extremity in the usual fashion redosed on her antibiotics and timeout called.  We began with reopening her longitudinal incision below the knee popliteal space.  We dissected through significant scar initially identified the bypass graft hooked into the popliteal artery.  We identified the posterior tibial vein next followed by the artery by taking down much of the soleus.  We then tediously dissected out the anterior tibial and peroneal arteries as well as the popliteal artery above the bypass graft.  We did have a few vein injuries which required oversewing although blood loss was minimal.  After we had all of our vessels exposed patient was given 8000 units of heparin and later was redosed with an additional 5000 units of heparin.  A transverse arteriotomy was made at the level of the anterior tibial artery.  We did not encounter any acute thrombus at this time but there was significant calcium as well as soft plaque.  We did remove some of this and then had some backbleeding from the anterior tibial artery.  A 3 Fogarty was placed on the anterior tibial artery but did not return any thrombus.  We did struggle  to get a Fogarty into the bypass graft and so we had to open onto the bypass graft in order to place a 4 Fogarty proximally which passed easily.  After performing embolectomy we had very brisk pulsatile inflow in the graft was flushed with heparinized saline and reclamped.  We were unfortunately not able to pass any Fogarty size down the  tibioperoneal trunk so this was opened longitudinally into the bifurcation.  There was significant calcification as well as soft plaque throughout this vessel and endarterectomy was performed.  Having endarterectomize the entire tibioperoneal trunk as well as distal popliteal artery and vein graft we then searched for greater saphenous vein but could find none and then lesser saphenous vein with ultrasound identified approximately 2-1/2 mm vein.  A 10 cm longitudinal incision was made overlying this vein in the posterior calf we dissected down to the vein itself and divided branches between clips and ties.  The vein was then harvested for the full length of the incision.  We then opened the vein longitudinally.  The arterial bed was flushed with heparinized saline as was the vein.  We then sewed the vein patch in place with 6-0 Prolene suture.  Prior to completing the anastomosis we did allow flushing in all directions and had brisk inflow with palpable pulsatility upon completion.  Doppler demonstrated high resistance waveforms in the anterior tibial and peroneal arteries however there was good outflow in the posterior tibial artery as well as a strong signal at the level of the ankle although nothing was palpable.  We also had a capillary refill on her foot which was not previously present.  Satisfied with this we obtained hemostasis in the wound and irrigated.  With the below-knee popliteal incision was closed with Vicryl followed by staples in the lesser saphenous vein harvest site was closed with staples alone.  Patient was then allowed away from anesthesia having tolerated procedure well without immediate complication.  All counts were correct at completion.  Next  Blood loss 200 cc.   Lakyra Tippins C. Donzetta Matters, MD [facility] [place] Office: [phone] Pager: [phone]

## 2017-11-29 NOTE — Progress Notes (Signed)
ANTICOAGULATION CONSULT NOTE - Follow Up Consult  Pharmacy Consult for Heparin Indication: occluded fem-pop bypass  No Known Allergies  Patient Measurements: Height: 5\' 7"  (170.2 cm) Weight: 166 lb 1.6 oz (75.3 kg) IBW/kg (Calculated) : 61.6 Heparin Dosing Weight: 73.5 kg  Vital Signs: Temp: 97.7 F (36.5 C) (01/12 1900) Temp Source: Oral (01/12 1900) BP: 81/65 (01/12 1955) Pulse Rate: 96 (01/12 2100)  Labs: Recent Labs    11/27/17 1802 11/28/17 0137  11/28/17 1900 11/28/17 2232 11/29/17 0425 11/29/17 1817  HGB  --  10.1*   < > 11.5* 10.5* 9.3*  --   HCT  --  31.0*   < > 33.8* 31.0* 27.9*  --   PLT  --  285  --  239  --  221  --   APTT  --   --   --  42*  --   --   --   LABPROT  --  16.1*  --  17.0*  --   --   --   INR  --  1.30  --  1.40  --   --   --   HEPARINUNFRC 0.17* 0.50  --   --   --   --  <0.10*  CREATININE  --  1.07*  --  0.70  --  0.85  --    < > = values in this interval not displayed.    Estimated Creatinine Clearance: 81.1 mL/min (by C-G formula based on SCr of 0.85 mg/dL).   Medications:  Scheduled:  . docusate sodium  100 mg Oral Daily  . insulin aspart  0-9 Units Subcutaneous TID WC  . pantoprazole  40 mg Oral Daily   Infusions:  . cefUROXime (ZINACEF)  IV Stopped (11/29/17 1155)  . dextrose 5 % and 0.45 % NaCl with KCl 20 mEq/L 125 mL/hr at 11/29/17 2000  . 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  VVS reviewing for possible surgical intervention > went back to OR last pm for redo fem-pop.  Foot remains purple today but slightly improved. Heparin level = <0.1, undetectable on Heparin drip 1000 units/hr  . RN reports heparin infusing via central line. Level drawn from arterial line.   No bleeding noted per RN.      Goal of Therapy:  Heparin level 0.3-0.7 units/ml Monitor platelets by anticoagulation protocol: Yes   Plan:  Increase  heparin at 1200 units/hr HL 6hr from increase. Daily heparin level and CBC Monitor for s/sx of bleeding   Nicole Cella, RPh Clinical Pharmacist 11/29/2017 9:45 PM

## 2017-11-30 ENCOUNTER — Encounter (HOSPITAL_COMMUNITY): Payer: Self-pay | Admitting: Vascular Surgery

## 2017-11-30 ENCOUNTER — Inpatient Hospital Stay (HOSPITAL_COMMUNITY): Payer: Medicare Other

## 2017-11-30 DIAGNOSIS — D649 Anemia, unspecified: Secondary | ICD-10-CM

## 2017-11-30 DIAGNOSIS — Z9889 Other specified postprocedural states: Secondary | ICD-10-CM

## 2017-11-30 DIAGNOSIS — R579 Shock, unspecified: Secondary | ICD-10-CM

## 2017-11-30 DIAGNOSIS — G9340 Encephalopathy, unspecified: Secondary | ICD-10-CM

## 2017-11-30 DIAGNOSIS — I499 Cardiac arrhythmia, unspecified: Secondary | ICD-10-CM

## 2017-11-30 LAB — PREPARE RBC (CROSSMATCH)

## 2017-11-30 LAB — URINALYSIS, ROUTINE W REFLEX MICROSCOPIC
Bilirubin Urine: NEGATIVE
Glucose, UA: 50 mg/dL — AB
KETONES UR: NEGATIVE mg/dL
Leukocytes, UA: NEGATIVE
Nitrite: NEGATIVE
Protein, ur: NEGATIVE mg/dL
SQUAMOUS EPITHELIAL / LPF: NONE SEEN
Specific Gravity, Urine: 1.012 (ref 1.005–1.030)
pH: 5 (ref 5.0–8.0)

## 2017-11-30 LAB — HEPARIN LEVEL (UNFRACTIONATED)
Heparin Unfractionated: 0.19 IU/mL — ABNORMAL LOW (ref 0.30–0.70)
Heparin Unfractionated: 0.3 IU/mL (ref 0.30–0.70)

## 2017-11-30 LAB — POCT I-STAT 3, ART BLOOD GAS (G3+)
Acid-base deficit: 7 mmol/L — ABNORMAL HIGH (ref 0.0–2.0)
Bicarbonate: 17.1 mmol/L — ABNORMAL LOW (ref 20.0–28.0)
O2 Saturation: 98 %
PH ART: 7.44 (ref 7.350–7.450)
TCO2: 18 mmol/L — ABNORMAL LOW (ref 22–32)
pCO2 arterial: 25.1 mmHg — ABNORMAL LOW (ref 32.0–48.0)
pO2, Arterial: 89 mmHg (ref 83.0–108.0)

## 2017-11-30 LAB — CBC
HCT: 24.2 % — ABNORMAL LOW (ref 36.0–46.0)
HCT: 28.1 % — ABNORMAL LOW (ref 36.0–46.0)
HCT: 30 % — ABNORMAL LOW (ref 36.0–46.0)
HEMOGLOBIN: 9.3 g/dL — AB (ref 12.0–15.0)
Hemoglobin: 7.9 g/dL — ABNORMAL LOW (ref 12.0–15.0)
Hemoglobin: 9.9 g/dL — ABNORMAL LOW (ref 12.0–15.0)
MCH: 27.1 pg (ref 26.0–34.0)
MCH: 27.9 pg (ref 26.0–34.0)
MCH: 27.9 pg (ref 26.0–34.0)
MCHC: 32.6 g/dL (ref 30.0–36.0)
MCHC: 33.1 g/dL (ref 30.0–36.0)
MCHC: 33 g/dL (ref 30.0–36.0)
MCV: 82.9 fL (ref 78.0–100.0)
MCV: 84.4 fL (ref 78.0–100.0)
MCV: 84.5 fL (ref 78.0–100.0)
PLATELETS: 224 10*3/uL (ref 150–400)
PLATELETS: 290 10*3/uL (ref 150–400)
Platelets: 223 10*3/uL (ref 150–400)
RBC: 2.92 MIL/uL — ABNORMAL LOW (ref 3.87–5.11)
RBC: 3.33 MIL/uL — AB (ref 3.87–5.11)
RBC: 3.55 MIL/uL — AB (ref 3.87–5.11)
RDW: 14.4 % (ref 11.5–15.5)
RDW: 14.6 % (ref 11.5–15.5)
RDW: 14.5 % (ref 11.5–15.5)
WBC: 15.1 10*3/uL — ABNORMAL HIGH (ref 4.0–10.5)
WBC: 16.4 10*3/uL — ABNORMAL HIGH (ref 4.0–10.5)
WBC: 21.1 10*3/uL — ABNORMAL HIGH (ref 4.0–10.5)

## 2017-11-30 LAB — POCT I-STAT, CHEM 8
BUN: 8 mg/dL (ref 6–20)
CALCIUM ION: 1.09 mmol/L — AB (ref 1.15–1.40)
Chloride: 100 mmol/L — ABNORMAL LOW (ref 101–111)
Creatinine, Ser: 0.6 mg/dL (ref 0.44–1.00)
Glucose, Bld: 138 mg/dL — ABNORMAL HIGH (ref 65–99)
HCT: 24 % — ABNORMAL LOW (ref 36.0–46.0)
Hemoglobin: 8.2 g/dL — ABNORMAL LOW (ref 12.0–15.0)
Potassium: 3.8 mmol/L (ref 3.5–5.1)
SODIUM: 137 mmol/L (ref 135–145)
TCO2: 23 mmol/L (ref 22–32)

## 2017-11-30 LAB — GLUCOSE, CAPILLARY
GLUCOSE-CAPILLARY: 133 mg/dL — AB (ref 65–99)
GLUCOSE-CAPILLARY: 153 mg/dL — AB (ref 65–99)
GLUCOSE-CAPILLARY: 147 mg/dL — AB (ref 65–99)
GLUCOSE-CAPILLARY: 114 mg/dL — AB (ref 65–99)
GLUCOSE-CAPILLARY: 109 mg/dL — AB (ref 65–99)
Glucose-Capillary: 108 mg/dL — ABNORMAL HIGH (ref 65–99)
Glucose-Capillary: 105 mg/dL — ABNORMAL HIGH (ref 65–99)

## 2017-11-30 LAB — LACTIC ACID, PLASMA: LACTIC ACID, VENOUS: 0.8 mmol/L (ref 0.5–1.9)

## 2017-11-30 LAB — BASIC METABOLIC PANEL
Anion gap: 5 (ref 5–15)
BUN: 8 mg/dL (ref 6–20)
CO2: 22 mmol/L (ref 22–32)
CREATININE: 0.69 mg/dL (ref 0.44–1.00)
Calcium: 7.3 mg/dL — ABNORMAL LOW (ref 8.9–10.3)
Chloride: 108 mmol/L (ref 101–111)
GFR calc Af Amer: >60 mL/min (ref >60)
GLUCOSE: 162 mg/dL — AB (ref 65–99)
Potassium: 3.8 mmol/L (ref 3.5–5.1)
Sodium: 135 mmol/L (ref 135–145)

## 2017-11-30 LAB — CORTISOL: CORTISOL PLASMA: 76.5 ug/dL

## 2017-11-30 LAB — MAGNESIUM: Magnesium: 2.1 mg/dL (ref 1.7–2.4)

## 2017-11-30 LAB — TROPONIN I: Troponin I: <0.03 ng/mL (ref <0.03)

## 2017-11-30 MED ORDER — PHENYLEPHRINE HCL 10 MG/ML IJ SOLN
0.0000 ug/min | INTRAMUSCULAR | Status: DC
  Administered 2017-11-30: 15 ug/min via INTRAVENOUS
  Administered 2017-11-30: 10 ug/min via INTRAVENOUS
  Administered 2017-12-01 (×2): 20 ug/min via INTRAVENOUS
  Administered 2017-12-02 (×2): 40 ug/min via INTRAVENOUS
  Administered 2017-12-02: 20 ug/min via INTRAVENOUS
  Administered 2017-12-02: 30 ug/min via INTRAVENOUS
  Filled 2017-11-30 (×4): qty 10
  Filled 2017-11-30 (×6): qty 1

## 2017-11-30 MED ORDER — ACETAMINOPHEN 10 MG/ML IV SOLN
1000.0000 mg | Freq: Once | INTRAVENOUS | Status: AC
  Administered 2017-11-30: 1000 mg via INTRAVENOUS
  Filled 2017-11-30: qty 100

## 2017-11-30 MED ORDER — CHLORHEXIDINE GLUCONATE CLOTH 2 % EX PADS
6.0000 | MEDICATED_PAD | Freq: Every day | CUTANEOUS | Status: DC
  Administered 2017-11-30 – 2017-12-03 (×4): 6 via TOPICAL

## 2017-11-30 MED ORDER — HYDROMORPHONE HCL 1 MG/ML IJ SOLN
0.5000 mg | INTRAMUSCULAR | Status: DC | PRN
  Administered 2017-11-30 – 2017-12-01 (×6): 0.5 mg via INTRAVENOUS
  Filled 2017-11-30 (×6): qty 1

## 2017-11-30 MED ORDER — PANTOPRAZOLE SODIUM 40 MG PO PACK
40.0000 mg | PACK | Freq: Every day | ORAL | Status: DC
  Administered 2017-11-30: 40 mg
  Filled 2017-11-30: qty 20

## 2017-11-30 MED ORDER — ALBUMIN HUMAN 25 % IV SOLN
25.0000 g | Freq: Once | INTRAVENOUS | Status: AC
  Administered 2017-11-30: 25 g via INTRAVENOUS
  Filled 2017-11-30: qty 50

## 2017-11-30 MED ORDER — SODIUM CHLORIDE 0.9% FLUSH: 10.0000 mL | INTRAVENOUS | Status: DC | PRN

## 2017-11-30 MED ORDER — HYDROCORTISONE NA SUCCINATE PF 100 MG IJ SOLR
50.0000 mg | Freq: Four times a day (QID) | INTRAMUSCULAR | Status: DC
  Administered 2017-11-30 – 2017-12-01 (×5): 50 mg via INTRAVENOUS
  Filled 2017-11-30 (×5): qty 2

## 2017-11-30 MED ORDER — SODIUM CHLORIDE 0.9 % IV SOLN
Freq: Once | INTRAVENOUS | Status: AC
  Administered 2017-11-30: 03:00:00 via INTRAVENOUS

## 2017-11-30 MED ORDER — SODIUM CHLORIDE 0.9 % IV BOLUS (SEPSIS)
1000.0000 mL | Freq: Once | INTRAVENOUS | Status: AC
  Administered 2017-11-30: 1000 mL via INTRAVENOUS

## 2017-11-30 MED ORDER — SODIUM CHLORIDE 0.9% FLUSH
10.0000 mL | Freq: Two times a day (BID) | INTRAVENOUS | Status: DC
  Administered 2017-11-30 – 2017-12-03 (×6): 10 mL

## 2017-11-30 MED ORDER — ORAL CARE MOUTH RINSE
15.0000 mL | Freq: Two times a day (BID) | OROMUCOSAL | Status: DC
  Administered 2017-11-30 – 2017-12-09 (×8): 15 mL via OROMUCOSAL

## 2017-11-30 MED ORDER — MAGNESIUM SULFATE 2 GM/50ML IV SOLN
2.0000 g | Freq: Once | INTRAVENOUS | Status: AC
  Administered 2017-11-30: 2 g via INTRAVENOUS
  Filled 2017-11-30: qty 50

## 2017-11-30 NOTE — Progress Notes (Signed)
ANTICOAGULATION CONSULT NOTE - Follow Up Consult  Pharmacy Consult for Heparin Indication: occluded fem-pop bypass  No Known Allergies  Patient Measurements: Height: 5\' 7"  (170.2 cm) Weight: 166 lb 1.6 oz (75.3 kg) IBW/kg (Calculated) : 61.6 Heparin Dosing Weight: 73.5 kg  Vital Signs: Temp: 97.5 F (36.4 C) (01/13 0830) Temp Source: Oral (01/13 0830) BP: 106/48 (01/13 1415) Pulse Rate: 98 (01/13 1415)  Labs: Recent Labs    11/28/17 0137  11/28/17 1900  11/29/17 0425 11/29/17 1817 11/30/17 0008 11/30/17 0206 11/30/17 0219 11/30/17 0351 11/30/17 0532 11/30/17 1236  HGB 10.1*   < > 11.5*   < > 9.3*  --  8.2* 7.9*  --   --  9.3* 9.9*  HCT 31.0*   < > 33.8*   < > 27.9*  --  24.0* 24.2*  --   --  28.1* 30.0*  PLT 285  --  239  --  221  --   --  224  --   --  223 290  APTT  --   --  42*  --   --   --   --   --   --   --   --   --   LABPROT 16.1*  --  17.0*  --   --   --   --   --   --   --   --   --   INR 1.30  --  1.40  --   --   --   --   --   --   --   --   --   HEPARINUNFRC 0.50  --   --   --   --  <0.10*  --   --   --  <0.10*  --  0.19*  CREATININE 1.07*  --  0.70  --  0.85  --  0.60 0.69  --   --   --   --   TROPONINI  --   --   --   --   --   --   --   --  <0.03  --   --   --    < > = values in this interval not displayed.    Estimated Creatinine Clearance: 86.1 mL/min (by C-G formula based on SCr of 0.69 mg/dL).   Medications:  Scheduled:  . Chlorhexidine Gluconate Cloth  6 each Topical Daily  . docusate sodium  100 mg Oral Daily  . hydrocortisone sodium succinate  50 mg Intravenous Q6H  . insulin aspart  0-9 Units Subcutaneous TID WC  . mouth rinse  15 mL Mouth Rinse BID  . pantoprazole sodium  40 mg Per Tube Daily  . sodium chloride flush  10-40 mL Intracatheter Q12H   Infusions:  . heparin 1,450 Units/hr (11/30/17 1400)  . 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  VVS reviewing for possible surgical intervention > went back to OR 1/11 for redo fem-pop. Hgb trending down overnight to 7.9, 1 uPRBC transfused Hgb now 9.3. CT abdomen negative for intra-abdominal bleed. Vascular surgeon rounding on pt when RPh called, and he wants to continue anticoagulation with goal to achieve therapeutic levels. Heparin level 0.19 heparin drip rate 1450 uts/hr.  No infusion issues or overt bleeding reported   Goal of Therapy:  Heparin level 0.3-0.7 units/ml Monitor platelets by anticoagulation  protocol: Yes   Plan:  Increase heparin gtt to 1650 Heparin level 6 hour Daily heparin level and CBC Monitor for s/sx of bleeding   Bonnita Nasuti Pharm.D. CPP, BCPS Clinical Pharmacist [phone] 11/30/2017 3:22 PM

## 2017-11-30 NOTE — Progress Notes (Signed)
PULMONARY / CRITICAL CARE MEDICINE   Name: [name] MRN: [id number] DOB: 1966/03/03    ADMISSION DATE:  11/24/2017 CONSULTATION DATE:  11/30/2017  REFERRING MD:  Dr. Donzetta Matters    CHIEF COMPLAINT:  Hypotension   HISTORY OF PRESENT ILLNESS:   52 year old female with PMH of DM, GERD, HLD, PAD, CAD  Presents 1/7 for left lower extremity pain with concern for occlusion of left femoral-popliteal bypass graft. 1/11 underwent exploration of left fem-pop bypass graft, embolectomy, and revision. Upon return to the unit patient was having pins and needle feeling of the left foot and purple discoloration with minimal cap refill. Went back to OR for revascularization of bilateral femoral to popliteal arteries, application of femoral wound vac x2. 1/13 patient with progressive hypotension, PCCM asked to consult.   SUBJECTIVE:  Requiring pressors overnight with shock  VITAL SIGNS: BP 121/77   Pulse (!) 34   Temp (!) 97.5 F (36.4 C) (Oral)   Resp 19   Ht 5\' 7"  (1.702 m)   Wt 75.3 kg (166 lb 1.6 oz)   SpO2 100%   BMI 26.01 kg/m   HEMODYNAMICS: CVP:  [1 mmHg-13 mmHg] 7 mmHg  VENTILATOR SETTINGS:    INTAKE / OUTPUT: I/O last 3 completed shifts: In: 7080.5 [I.V.:5513.9; Blood:306.7; Other:10; IV Piggyback:1250] Out: 2120 [Urine:1220; Emesis/NG output:700; Blood:200]  PHYSICAL EXAMINATION: General:  Adult female, resting comfortably in exam bed with NAD Neuro:  Alert and interactive, moving ext to command HEENT:  [place]/AT, PERRL, EOM-I and MMM Cardiovascular:  RRR, Nl S1/S2 and -M/R/G. Lungs:  CTA bilaterally  Abdomen:  Soft, NT, ND and +BS Musculoskeletal:  Chronic PVD, bilateral femoral wound vac in place Skin:  Dry  LABS:  BMET Recent Labs  Lab 11/28/17 1900  11/29/17 0425 11/30/17 0008 11/30/17 0206  NA 133*   < > 134* 137 135  K 4.1   < > 4.0 3.8 3.8  CL 103  --  106 100* 108  CO2 22  --  19*  --  22  BUN 15  --  14 8 8   CREATININE 0.70  --  0.85 0.60 0.69  GLUCOSE  152*   < > 160* 138* 162*   < > = values in this interval not displayed.   Electrolytes Recent Labs  Lab 11/28/17 1900 11/29/17 0425 11/30/17 0206  CALCIUM 8.0* 7.5* 7.3*  MG 1.3* 1.1* 2.1   CBC Recent Labs  Lab 11/29/17 0425 11/30/17 0008 11/30/17 0206 11/30/17 0532  WBC 13.8*  --  15.1* 16.4*  HGB 9.3* 8.2* 7.9* 9.3*  HCT 27.9* 24.0* 24.2* 28.1*  PLT 221  --  224 223   Coag's Recent Labs  Lab 11/24/17 1852 11/28/17 0137 11/28/17 1900  APTT  --   --  42*  INR 1.18 1.30 1.40   Sepsis Markers Recent Labs  Lab 11/29/17 1045 11/29/17 1313 11/30/17 0218  LATICACIDVEN 2.5* 1.8 0.8   ABG Recent Labs  Lab 11/28/17 1915 11/29/17 0503 11/30/17 0205  PHART 7.372 7.326* 7.440  PCO2ART 41.7 36.4 25.1*  PO2ART 230* 119.0* 89.0   Liver Enzymes Recent Labs  Lab 11/24/17 1852 11/29/17 0425  AST 40 37  ALT 14 12*  ALKPHOS 188* 94  BILITOT 0.6 0.5  ALBUMIN 2.2* 1.9*   Cardiac Enzymes Recent Labs  Lab 11/30/17 0219  TROPONINI <0.03   Glucose Recent Labs  Lab 11/29/17 1306 11/29/17 1551 11/29/17 2132 11/30/17 0233 11/30/17 0400 11/30/17 0802  GLUCAP 198* 217* 152* 133*  153* 147*   Imaging Ct Abdomen Pelvis Wo Contrast  Result Date: 11/30/2017 CLINICAL DATA:  52 year old female status post aortofemoral bypass graft presenting with abdominal pain and drop in hemoglobin. Concern for postop bleed. EXAM: CT ABDOMEN AND PELVIS WITHOUT CONTRAST TECHNIQUE: Multidetector CT imaging of the abdomen and pelvis was performed following the standard protocol without IV contrast. COMPARISON:  Abdominal radiograph dated 11/28/2017 and CT dated 11/26/2017 FINDINGS: Evaluation of this exam is limited in the absence of intravenous contrast. Evaluation is also limited due to streak artifact caused by patient's arms. Lower chest: Trace bilateral pleural effusions. Patchy consolidative changes of the bases of the lower lobes, left greater right likely atelectatic changes.  Pneumonia is not excluded. Clinical correlation is recommended. Multi vessel coronary vascular calcification. There is hypoattenuation of the cardiac blood pool suggestive of a degree of anemia. Clinical correlation is recommended. Multiple small pockets of intraperitoneal air most consistent with history of recent surgery. There is diffuse mesenteric stranding. Small amount of low attenuating fluid within the pelvis as well as small perihepatic and perisplenic fluid. No intra-abdominal hematoma. Hepatobiliary: Slight irregularity of the liver contour with enlargement of the left lobe of the liver suspicious for early changes of cirrhosis. No intrahepatic biliary ductal dilatation. Cholecystectomy. Pancreas: Unremarkable. No pancreatic ductal dilatation or surrounding inflammatory changes. Spleen: Normal in size without focal abnormality. Adrenals/Urinary Tract: The adrenal glands appear unremarkable. There is no hydronephrosis or nephrolithiasis on either side. The visualized ureters are unremarkable. The urinary bladder is decompressed around a Foley catheter. Stomach/Bowel: An enteric tube is seen with tip in the proximal stomach. There is no bowel obstruction or active inflammation. Normal appendix. Vascular/Lymphatic: Advanced aortoiliac atherosclerotic disease. Evaluation of the vasculature is limited in the absence of intravenous contrast. An aortobifemoral bypass graft is seen. No adenopathy. Reproductive: The uterus is anteverted and grossly unremarkable. The ovaries are grossly unremarkable as visualized. Other: There is diffuse subcutaneous soft tissue stranding and edema. No fluid collection or hematoma. No hematoma noted in the region of the groin. Musculoskeletal: Fatty atrophy of the paraspinal and pelvic musculature. L4-L5 and L5-S1 disc spacer. L5-S1 posterior fusion screws. No acute osseous pathology. IMPRESSION: 1. Postsurgical changes of aortobifemoral bypass graft. There is small amount of  pneumoperitoneum and diffuse mesenteric and abdominal wall edema. No hematoma. 2. Bibasilar consolidative changes may represent atelectasis versus pneumonia. Clinical correlation is recommended. 3. Coronary vascular calcification and Aortic Atherosclerosis (ICD10-I70.0). Electronically Signed   By: Anner Crete M.D.   On: 11/30/2017 05:06   Dg Chest Port 1 View  Result Date: 11/30/2017 CLINICAL DATA:  Shock EXAM: PORTABLE CHEST 1 VIEW COMPARISON:  11/28/2017 FINDINGS: Right-sided central venous catheter tip projects over the SVC. Esophageal tube tip courses below diaphragm but is not included. Cardiomegaly with development of small left effusion and left basilar atelectasis or pneumonia. Vascular congestion. No pneumothorax. IMPRESSION: 1. Cardiomegaly with increased vascular congestion and mild perihilar edema. 2. Development of small left pleural effusion and left basilar atelectasis versus infiltrate Electronically Signed   By: Donavan Foil M.D.   On: 11/30/2017 02:51   STUDIES:  CTA AO+BIFEM 1/09 > 1. Interval occlusion of the left external iliac and common femoral arteries as well as the femoral to popliteal bypass graft. This is a new finding compared to 08/11/2017. 2. Significant disease in the left common iliac artery may be slightly progressed compared to prior. 3. Additional severe right lower extremity peripheral arterial disease as detailed above without significant change. 4. Advanced aortic atherosclerosis  with approximately 50% narrowing of the distal aorta. CXR 1/11 > The heart size and mediastinal contours are within normal limits. Right central venous line/vascular sheath is identified with distal tip in the superior vena cava. Nasogastric tube is identified with distal tip not included on film. Minimal atelectasis of right lung base is noted. Both lungs are otherwise clear. The visualized skeletal structures are unremarkable. KUB 1/11 > The bowel gas pattern is normal.  Nasogastric tube is identified with distal tip in the proximal stomach. Prior cholecystectomy clips are noted. Extensive bowel content is identified throughout colon. Vascular stents identified in the right iliac vessel. No radio-opaque calculi or other significant radiographic abnormality are seen.  CULTURES: Blood 1/13 >>NTD U/A 1/13 >>NTD  ANTIBIOTICS: None.   SIGNIFICANT EVENTS: 1/7 > Presents to Hospital   LINES/TUBES: RIJ CVC 1/11 >> Right Radial Aline 1/11 >>   DISCUSSION: 52 year old female presents with left lower extremity pain with concern for occlusion of left femoral-popliteal bypass graft. On 1/11 underwent aortobifemoral bypass grafting as well as revision of the graft on the right and embolectomy of the graft on the left.   ASSESSMENT / PLAN:  Hypotension in setting of polypharmacy vs hypovolemia (CVP 4) vs sepsis (WBC 15.1) vs anemia s/p surgery vs concern for retroperitoneal bleed.  History of cortisol of 7.5 2 years ago. Plan  -F/U U/A and Blood Cultures -Neo for BP support -A-line in place -Cortisol level -Stress dose steroids  PVD s/p femoral-popliteal bypass grafts, thrombectomy, revision, revascularization, and placement of bilateral femoral Provena wound vac -Per vascular surgery -Heparin per pharmacy  Acute Pain  Dilaudid 0.5 mg at 2010, 2342  Plan  -Decrease Dilaudid to 0.5mg  q4h  -Acetaminophen IV once   FAMILY  - Updates: No family bedside  - Inter-disciplinary family meet or Palliative Care meeting due by: 12/07/2017  The patient is critically ill with multiple organ systems failure and requires high complexity decision making for assessment and support, frequent evaluation and titration of therapies, application of advanced monitoring technologies and extensive interpretation of multiple databases.   Critical Care Time devoted to patient care services described in this note is  45  Minutes. This time reflects time of care of this signee  Dr [name]. This critical care time does not reflect procedure time, or teaching time or supervisory time of PA/NP/Med student/Med Resident etc but could involve care discussion time.  Rush Farmer, M.D. [facility] Pulmonary/Critical Care Medicine. Pager: [phone]. After hours pager: [phone].

## 2017-11-30 NOTE — Progress Notes (Signed)
Patient having increased frequency of PVCs with bigeminy & trigeminy at times. Dr. Donzetta Matters paged and made aware, reviewed all labs. Orders to replace Mg and recheck Mg level with AM labs. Will implement and continue to monitor.

## 2017-11-30 NOTE — Progress Notes (Signed)
[place] Progress Note Patient Name: LOTOYA CASELLA DOB: [birth year] MRN: [id number]   Date of Service  11/30/2017  HPI/Events of Note  Hypotension - CXR reveals pulmonary vascular congestion. However, CVP = 2 according to bedside nurse.  CT Scan of Abdomen/Pelvis reveals: 1. Postsurgical changes of aortobifemoral bypass graft. There is small amount of pneumoperitoneum and diffuse mesenteric and abdominal wall edema. No hematoma. 2. Bibasilar consolidative changes may represent atelectasis versus pneumonia. Clinical correlation is recommended. 3. Coronary vascular calcification and Aortic Atherosclerosis  eICU Interventions  Will order: 1. Phenylephrine IV infusion. Titrate to MAP > 65. 2. Will ask ground team to assess at bedside.      Intervention Category Major Interventions: Hypotension - evaluation and management  Dashae Wilcher [place] 11/30/2017, 6:11 AM

## 2017-11-30 NOTE — Progress Notes (Signed)
  Progress Note    11/30/2017 7:13 AM 2 Days Post-Op  Subjective:  Still has abdominal pain  Hypotensive overnight, transfused with adequate response and CCM consulted  Vitals:   11/30/17 0645 11/30/17 0700  BP: 126/65 121/68  Pulse: (!) 105 (!) 103  Resp: 18 19  Temp:    SpO2: 97% 99%    Physical Exam: Alert and oriented Non labored respirations Abdomen is soft, incision cdi Bilateral groin incisions with prevena Right peroneal signal, left dp on foot, pt is strongest at ankle but AT and peroneal are present Left foot discoloration improving with cap refill, toes remain purple  CBC    Component Value Date/Time   WBC 16.4 (H) 11/30/2017 0532   RBC 3.33 (L) 11/30/2017 0532   HGB 9.3 (L) 11/30/2017 0532   HCT 28.1 (L) 11/30/2017 0532   PLT 223 11/30/2017 0532   MCV 84.4 11/30/2017 0532   MCH 27.9 11/30/2017 0532   MCHC 33.1 11/30/2017 0532   RDW 14.6 11/30/2017 0532   LYMPHSABS 2,760 08/12/2017 1530   MONOABS 558 05/12/2017 1034   EOSABS 200 08/12/2017 1530   BASOSABS 37 08/12/2017 1530    BMET    Component Value Date/Time   NA 135 11/30/2017 0206   K 3.8 11/30/2017 0206   CL 108 11/30/2017 0206   CO2 22 11/30/2017 0206   GLUCOSE 162 (H) 11/30/2017 0206   BUN 8 11/30/2017 0206   CREATININE 0.69 11/30/2017 0206   CREATININE 1.24 (H) 08/12/2017 1530   CALCIUM 7.3 (L) 11/30/2017 0206   GFRNONAA >60 11/30/2017 0206   GFRNONAA 50 (L) 08/12/2017 1530   GFRAA >60 11/30/2017 0206   GFRAA 57 (L) 08/12/2017 1530    INR    Component Value Date/Time   INR 1.40 11/28/2017 1900     Intake/Output Summary (Last 24 hours) at 11/30/2017 0713 Last data filed at 11/30/2017 0700 Gross per 24 hour  Intake 4528.35 ml  Output 1335 ml  Net 3193.35 ml      Assessment:  52 y.o. female is s/p:                    #1: Aortobifemoral bypass graft using a14 x 7 bifurcated Dacron graft #2: Reimplantation, inferior mesenteric  artery #3: Redo bilateral common femoral artery exposure #4: Thrombectomy of bilateral femoral below-knee popliteal artery bypass graft #5: Reimplantation of left femoral-popliteal bypass graft to the aortobifemoral graft #6: Revision of proximal portion of right femoral popliteal bypass graft using a interposition 6 mm propatent Gore-Tex graft #7: Left lower extremity angiogram #8: Intra-arterial administration of nitroglycerin #9: Left common femoral and profunda femoral endarterectomy #10: Aortic endarterectomy #11: Placement of bilateral femoral Provena wound vacs   and subsequent  1. Re-exposure of left below knee arteries > 30 days 2. Embolectomy of left lower extremity 3. Left popliteal and tibioperoneal trunk endarterectomy and patch angioplasty 4. Harvest of left lesser saphenous vein  Plan: Neuro: intermittent dilaudid iv pain control Pulm: needs incentive spirometry, stable on nasal cannula, cxr with pleural effusion GI: ng tube to lws with 100 out last 2 hours, ok for a few ice chips, continue today given other isseus FEN: adequate uop Heme/ID: h/h responded to transfusion, panculture per ccm Dispo: continue icu care, to side of bed at least today PPx: continue full dose heparin   Wilmar Prabhakar C. Donzetta Matters, MD [facility] Office: [phone] Pager: [phone]  11/30/2017 7:13 AM

## 2017-11-30 NOTE — Progress Notes (Signed)
ANTICOAGULATION CONSULT NOTE - Follow Up Consult  Pharmacy Consult for Heparin Indication: occluded fem-pop bypass  No Known Allergies  Patient Measurements: Height: 5\' 7"  (170.2 cm) Weight: 166 lb 1.6 oz (75.3 kg) IBW/kg (Calculated) : 61.6 Heparin Dosing Weight: 73.5 kg  Vital Signs: Temp: 98 F (36.7 C) (01/13 0353) Temp Source: Oral (01/13 0353) BP: 76/53 (01/13 0604) Pulse Rate: 57 (01/13 0604)  Labs: Recent Labs    11/28/17 0137  11/28/17 1900  11/29/17 0425 11/29/17 1817 11/30/17 0008 11/30/17 0206 11/30/17 0219 11/30/17 0351 11/30/17 0532  HGB 10.1*   < > 11.5*   < > 9.3*  --  8.2* 7.9*  --   --  9.3*  HCT 31.0*   < > 33.8*   < > 27.9*  --  24.0* 24.2*  --   --  28.1*  PLT 285  --  239  --  221  --   --  224  --   --  223  APTT  --   --  42*  --   --   --   --   --   --   --   --   LABPROT 16.1*  --  17.0*  --   --   --   --   --   --   --   --   INR 1.30  --  1.40  --   --   --   --   --   --   --   --   HEPARINUNFRC 0.50  --   --   --   --  <0.10*  --   --   --  <0.10*  --   CREATININE 1.07*  --  0.70  --  0.85  --  0.60 0.69  --   --   --   TROPONINI  --   --   --   --   --   --   --   --  <0.03  --   --    < > = values in this interval not displayed.    Estimated Creatinine Clearance: 86.1 mL/min (by C-G formula based on SCr of 0.69 mg/dL).   Medications:  Scheduled:  . docusate sodium  100 mg Oral Daily  . insulin aspart  0-9 Units Subcutaneous TID WC  . pantoprazole  40 mg Oral Daily   Infusions:  . dextrose 5 % and 0.45 % NaCl with KCl 20 mEq/L Stopped (11/30/17 0600)  . heparin 1,200 Units/hr (11/30/17 0503)  . 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  VVS reviewing for possible surgical intervention > went back to OR 1/11 for redo fem-pop. Hgb trending down overnight to 7.9, 1 uPRBC transfused Hgb now 9.3. CT abdomen negative for  intra-abdominal bleed. Vascular surgeon rounding on pt when RPh called, and he wants to continue anticoagulation with goal to achieve therapeutic levels. Will increase infusion (~3.5 units/kg/hr)  Heparin level undetectable, no infusion issues or overt bleeding reported by RN.   Goal of Therapy:  Heparin level 0.3-0.7 units/ml Monitor platelets by anticoagulation protocol: Yes   Plan:  Increase heparin gtt to 1450 Heparin level 6 hour Daily heparin level and CBC Monitor for s/sx of bleeding   Georga Bora, PharmD Clinical Pharmacist 11/30/2017 6:35 AM

## 2017-11-30 NOTE — Progress Notes (Signed)
Pt's a line reading 70s-80s/30s-40s MAPs 50s. Paged Dr. Donzetta Matters, orders to draw all AM labs now, get ABG, and give 1L bolus. MD in OR at the time and asked CCM to come evaluate. Will implement and continue to monitor.

## 2017-11-30 NOTE — Progress Notes (Signed)
Patient hypotensive again (70s/50s), coarse crackles and rhonchi auscultated throughout all lung fields. CVP 2-4. VVS notified and states will come to bedside shortly. Dr. Donzetta Matters asked CCM also be notified. Dr. Oletta Darter aware, orders placed and will send CCM to bedside. Continuing to monitor closely.

## 2017-11-30 NOTE — Consult Note (Signed)
PULMONARY / CRITICAL CARE MEDICINE   Name: [name] MRN: [id number] DOB: [birth year]    ADMISSION DATE:  11/24/2017 CONSULTATION DATE:  11/30/2017  REFERRING MD:  Dr. Donzetta Matters    CHIEF COMPLAINT:  Hypotension   HISTORY OF PRESENT ILLNESS:   52 year old female with PMH of DM, GERD, HLD, PAD, CAD  Presents 1/7 for left lower extremity pain with concern for occlusion of left femoral-popliteal bypass graft. 1/11 underwent exploration of left fem-pop bypass graft, embolectomy, and revision. Upon return to the unit patient was having pins and needle feeling of the left foot and purple discoloration with minimal cap refill. Went back to OR for revascularization of bilateral femoral to popliteal arteries, application of femoral wound vac x2. 1/13 patient with progressive hypotension, PCCM asked to consult.   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  PAST SURGICAL HISTORY: She  has a past surgical history that includes Arterial bypass surgry (07/05/2010); Skin graft (Right, 2012); Back surgery; Tonsillectomy; Spine surgery; Cholecystectomy; Abdominal aortagram (June 15, 2014); Intercostal nerve block (November 2015); abdominal aortagram (N/A, 06/15/2014); abdominal aortagram (N/A, 11/22/2014); Cystectomy (Right); Cystectomy (Left); left foot surgery; left wrist cyst removal (Left); Cardiac catheterization (N/A, 05/07/2016); Cardiac catheterization (N/A, 05/07/2016); Cardiac catheterization (N/A, 05/07/2016); Cardiac catheterization (N/A, 05/07/2016); Cardiac catheterization (Right, 05/07/2016); Cardiac catheterization (Right, 05/07/2016); Cardiac catheterization; Irrigation  and debridement buttocks (Right, 09/30/2016); ABDOMINAL AORTOGRAM W/LOWER EXTREMITY (N/A, 01/07/2017); IR Fluoro Guide CV Line Right (03/20/2017); IR US Guide Vasc Access Right (03/20/2017); ABDOMINAL AORTOGRAM W/LOWER EXTREMITY (N/A, 10/31/2017); and AORTIC ARCH ANGIOGRAPHY (N/A, 10/31/2017).  No Known Allergies  No current facility-administered medications on file prior to encounter.    Current Outpatient Medications on File Prior to Encounter  Medication Sig  . albuterol (PROVENTIL) 2 MG tablet Take 2 mg by mouth 3 (three) times daily as needed for shortness of breath.   . cetirizine (ZYRTEC) 10 MG tablet Take 10 mg by mouth daily as needed for allergies.   Marland Kitchen dicyclomine (BENTYL) 10 MG capsule Take 10 mg by mouth 4 (four) times daily as needed for spasms.  Marland Kitchen doxycycline (VIBRA-TABS) 100 MG tablet Take 1 tablet (100 mg total) by mouth 2 (two) times daily.  Marland Kitchen HUMALOG KWIKPEN 100 UNIT/ML KiwkPen Inject 10 Units into the skin See admin instructions. 10 units three times a day before meals per sliding scale  . HYDROcodone-acetaminophen (NORCO) 10-325 MG tablet Limit 1 tablet by mouth per day or twice per day if tolerated (Patient taking differently: Take 1-1.5 tablets by mouth 2 (two) times daily. )  . LANTUS SOLOSTAR 100 UNIT/ML Solostar Pen Inject 36 Units into the skin 2 (two) times daily.   . ondansetron (ZOFRAN ODT) 4 MG disintegrating tablet Take 1 tablet (4 mg total) by mouth every 8 (eight) hours as needed for nausea or vomiting.  . pregabalin (LYRICA) 100 MG capsule Limit 1 tab by mouth twice a day to 3 times a day if tolerated (Patient taking differently: Take 100 mg by mouth 3 (three) times daily. )  . omeprazole (PRILOSEC) 40 MG capsule Take 40 mg by mouth daily.    FAMILY HISTORY:  Her indicated that her mother is deceased. She indicated that her father is deceased. She indicated that  the status of her sister is unknown. She indicated that the status of her brother is unknown. She  indicated that her maternal grandmother is deceased. She indicated that her paternal grandmother is deceased. She indicated that her paternal grandfather is deceased.   SOCIAL HISTORY: She  reports that she quit smoking about 9 months ago. Her smoking use included cigarettes. She quit after 30.00 years of use. she has never used smokeless tobacco. She reports that she does not drink alcohol or use drugs.  REVIEW OF SYSTEMS:   All negative; except for those that are bolded, which indicate positives.  Constitutional: weight loss, weight gain, night sweats, fevers, chills, fatigue, weakness.  HEENT: headaches, sore throat, sneezing, nasal congestion, post nasal drip, difficulty swallowing, tooth/dental problems, visual complaints, visual changes, ear aches. Neuro: difficulty with speech, weakness, numbness, ataxia. CV:  chest pain, orthopnea, PND, swelling in lower extremities, dizziness, palpitations, syncope.  Resp: cough, hemoptysis, dyspnea, wheezing. GI: heartburn, indigestion, abdominal pain, nausea, vomiting, diarrhea, constipation, change in bowel habits, loss of appetite, hematemesis, melena, hematochezia.  GU: dysuria, change in color of urine, urgency or frequency, flank pain, hematuria. MSK: joint pain or swelling, decreased range of motion. Psych: change in mood or affect, depression, anxiety, suicidal ideations, homicidal ideations. Skin: rash, itching, bruising.   SUBJECTIVE:   VITAL SIGNS: BP (!) 62/44   Pulse (!) 39   Temp 98.3 F (36.8 C) (Oral)   Resp (!) 21   Ht 5\' 7"  (1.702 m)   Wt 75.3 kg (166 lb 1.6 oz)   SpO2 100%   BMI 26.01 kg/m   HEMODYNAMICS:    VENTILATOR SETTINGS:    INTAKE / OUTPUT: I/O last 3 completed shifts: In: 0932 [I.V.:7193; Blood:630; Other:10; NG/GT:20; IV Piggyback:500] Out: 27 [Urine:2840; Emesis/NG output:250; Blood:500]  PHYSICAL EXAMINATION: General:  Adult female, pale, diaphoretic  Neuro:  Alert, oriented, follows commands   HEENT:  Dry MM Cardiovascular:  Tachy, no MRG  Lungs:  Clear breath sounds, no wheeze/crackles  Abdomen:  Non-distended, hypoactive bowel sounds  Musculoskeletal:  Chronic PVD, bilateral femoral wound vac in place Skin:  Dry  LABS:  BMET Recent Labs  Lab 11/28/17 0137  11/28/17 1900 11/28/17 2232 11/29/17 0425 11/30/17 0008  NA 129*   < > 133* 137 134* 137  K 4.7   < > 4.1 3.6 4.0 3.8  CL 97*  --  103  --  106 100*  CO2 25  --  22  --  19*  --   BUN 23*  --  15  --  14 8  CREATININE 1.07*  --  0.70  --  0.85 0.60  GLUCOSE 96   < > 152* 203* 160* 138*   < > = values in this interval not displayed.    Electrolytes Recent Labs  Lab 11/28/17 0137 11/28/17 1900 11/29/17 0425  CALCIUM 8.1* 8.0* 7.5*  MG  --  1.3* 1.1*    CBC Recent Labs  Lab 11/28/17 0137  11/28/17 1900 11/28/17 2232 11/29/17 0425 11/30/17 0008  WBC 13.8*  --  11.5*  --  13.8*  --   HGB 10.1*   < > 11.5* 10.5* 9.3* 8.2*  HCT 31.0*   < > 33.8* 31.0* 27.9* 24.0*  PLT 285  --  239  --  221  --    < > = values in this interval not displayed.    Coag's Recent Labs  Lab 11/24/17 1852 11/28/17 0137 11/28/17 1900  APTT  --   --  42*  INR 1.18 1.30 1.40    Sepsis Markers Recent Labs  Lab 11/29/17 0450 11/29/17 1045 11/29/17 1313  LATICACIDVEN 3.8* 2.5* 1.8    ABG Recent Labs  Lab 11/28/17 1550 11/28/17 1915 11/29/17 0503  PHART 7.402 7.372 7.326*  PCO2ART 37.0 41.7 36.4  PO2ART 251.0* 230* 119.0*    Liver Enzymes Recent Labs  Lab 11/24/17 1852 11/29/17 0425  AST 40 37  ALT 14 12*  ALKPHOS 188* 94  BILITOT 0.6 0.5  ALBUMIN 2.2* 1.9*    Cardiac Enzymes No results for input(s): TROPONINI, PROBNP in the last 168 hours.  Glucose Recent Labs  Lab 11/29/17 0216 11/29/17 0505 11/29/17 0726 11/29/17 1306 11/29/17 1551 11/29/17 2132  GLUCAP 174* 140* 155* 198* 217* 152*    Imaging No results found.   STUDIES:  CTA AO+BIFEM 1/09 > 1. Interval occlusion of the  left external iliac and common femoral arteries as well as the femoral to popliteal bypass graft. This is a new finding compared to 08/11/2017. 2. Significant disease in the left common iliac artery may be slightly progressed compared to prior. 3. Additional severe right lower extremity peripheral arterial disease as detailed above without significant change. 4. Advanced aortic atherosclerosis with approximately 50% narrowing of the distal aorta. CXR 1/11 > The heart size and mediastinal contours are within normal limits. Right central venous line/vascular sheath is identified with distal tip in the superior vena cava. Nasogastric tube is identified with distal tip not included on film. Minimal atelectasis of right lung base is noted. Both lungs are otherwise clear. The visualized skeletal structures are unremarkable. KUB 1/11 > The bowel gas pattern is normal. Nasogastric tube is identified with distal tip in the proximal stomach. Prior cholecystectomy clips are noted. Extensive bowel content is identified throughout colon. Vascular stents identified in the right iliac vessel. No radio-opaque calculi or other significant radiographic abnormality are seen.  CULTURES: Blood 1/13 >> U/A 1/13 >>  ANTIBIOTICS: None.   SIGNIFICANT EVENTS: 1/7 > Presents to Hospital   LINES/TUBES: RIJ CVC 1/11 >> Right Radial Aline 1/11 >>   DISCUSSION: 52 year old female presents with left lower extremity pain with concern for occlusion of left femoral-popliteal bypass graft. On 1/11 underwent aortobifemoral bypass grafting as well as revision of the graft on the right and embolectomy of the graft on the left.   ASSESSMENT / PLAN:  Hypotension in setting of polypharmacy vs hypovolemia (CVP 4) vs sepsis (WBC 15.1) vs anemia s/p surgery vs concern for retroperitoneal bleed    Plan  -STAT CT A/P w/o  -Send U/A and Blood Cultures -LA and Troponin  -STAT Labs > Hemoglobin 11.5 > 7.9, 1unit RBC now   -1L NS Bolus  -A.Line non-functioning, have asked RT to replace   PVD s/p femoral-popliteal bypass grafts, thrombectomy, revision, revascularization, and placement of bilateral femoral Provena wound vac -Per vascular surgery  -Heparin per pharmacy   Acute Pain  Dilaudid 0.5 mg at 2010, 2342  Plan  -Decrease Dilaudid to 0.5mg  q4h  -Acetaminophen IV once   FAMILY  - Updates: no family at bedside   - Inter-disciplinary family meet or Palliative Care meeting due by: 12/07/2017   Hayden Pedro, AGACNP-BC [facility]  Pgr: [phone]  PCCM Pgr: [phone]

## 2017-12-01 ENCOUNTER — Encounter (HOSPITAL_COMMUNITY): Payer: Self-pay | Admitting: Surgery

## 2017-12-01 LAB — TYPE AND SCREEN
ABO/RH(D): O NEG
Antibody Screen: NEGATIVE
UNIT DIVISION: 0
UNIT DIVISION: 0
UNIT DIVISION: 0
Unit division: 0

## 2017-12-01 LAB — BPAM RBC
BLOOD PRODUCT EXPIRATION DATE: 201901312359
BLOOD PRODUCT EXPIRATION DATE: 201901312359
BLOOD PRODUCT EXPIRATION DATE: 201902022359
BLOOD PRODUCT EXPIRATION DATE: 201902032359
ISSUE DATE / TIME: 201901111208
ISSUE DATE / TIME: 201901111208
ISSUE DATE / TIME: 201901130209
ISSUE DATE / TIME: 201901111208
UNIT TYPE AND RH: 9500
UNIT TYPE AND RH: 9500
Unit Type and Rh: 9500
Unit Type and Rh: 9500

## 2017-12-01 LAB — BASIC METABOLIC PANEL
ANION GAP: 9 (ref 5–15)
BUN: 9 mg/dL (ref 6–20)
CALCIUM: 8 mg/dL — AB (ref 8.9–10.3)
CO2: 20 mmol/L — ABNORMAL LOW (ref 22–32)
Chloride: 108 mmol/L (ref 101–111)
Creatinine, Ser: 0.78 mg/dL (ref 0.44–1.00)
Glucose, Bld: 113 mg/dL — ABNORMAL HIGH (ref 65–99)
Potassium: 4.3 mmol/L (ref 3.5–5.1)
SODIUM: 137 mmol/L (ref 135–145)

## 2017-12-01 LAB — CBC
HCT: 30.2 % — ABNORMAL LOW (ref 36.0–46.0)
Hemoglobin: 10 g/dL — ABNORMAL LOW (ref 12.0–15.0)
MCH: 28.3 pg (ref 26.0–34.0)
MCHC: 33.1 g/dL (ref 30.0–36.0)
MCV: 85.6 fL (ref 78.0–100.0)
PLATELETS: 268 10*3/uL (ref 150–400)
RBC: 3.53 MIL/uL — AB (ref 3.87–5.11)
RDW: 14.8 % (ref 11.5–15.5)
WBC: 24.5 10*3/uL — AB (ref 4.0–10.5)

## 2017-12-01 LAB — PHOSPHORUS: PHOSPHORUS: 3.6 mg/dL (ref 2.5–4.6)

## 2017-12-01 LAB — GLUCOSE, CAPILLARY
GLUCOSE-CAPILLARY: 121 mg/dL — AB (ref 65–99)
Glucose-Capillary: 151 mg/dL — ABNORMAL HIGH (ref 65–99)
Glucose-Capillary: 129 mg/dL — ABNORMAL HIGH (ref 65–99)
Glucose-Capillary: 137 mg/dL — ABNORMAL HIGH (ref 65–99)

## 2017-12-01 LAB — MAGNESIUM: MAGNESIUM: 2 mg/dL (ref 1.7–2.4)

## 2017-12-01 LAB — HEPARIN LEVEL (UNFRACTIONATED): HEPARIN UNFRACTIONATED: 0.36 [IU]/mL (ref 0.30–0.70)

## 2017-12-01 MED ORDER — SODIUM CHLORIDE 0.9 % IV BOLUS (SEPSIS)
1000.0000 mL | Freq: Once | INTRAVENOUS | Status: AC
  Administered 2017-12-01: 1000 mL via INTRAVENOUS

## 2017-12-01 MED ORDER — AMIODARONE LOAD VIA INFUSION
150.0000 mg | Freq: Once | INTRAVENOUS | Status: AC
Start: 2017-12-01 — End: 2017-12-01
  Administered 2017-12-01: 150 mg via INTRAVENOUS
  Filled 2017-12-01: qty 83.34

## 2017-12-01 MED ORDER — SODIUM CHLORIDE 0.9 % IV SOLN
INTRAVENOUS | Status: DC
  Administered 2017-12-01: 12:00:00 via INTRAVENOUS

## 2017-12-01 MED ORDER — HYDROMORPHONE HCL 1 MG/ML IJ SOLN
0.5000 mg | INTRAMUSCULAR | Status: DC | PRN
  Administered 2017-12-01 – 2017-12-09 (×30): 0.5 mg via INTRAVENOUS
  Filled 2017-12-01 (×2): qty 0.5
  Filled 2017-12-01: qty 1
  Filled 2017-12-01: qty 0.5
  Filled 2017-12-01: qty 1
  Filled 2017-12-01: qty 0.5
  Filled 2017-12-01 (×2): qty 1
  Filled 2017-12-01: qty 0.5
  Filled 2017-12-01: qty 1
  Filled 2017-12-01: qty 0.5
  Filled 2017-12-01 (×4): qty 1
  Filled 2017-12-01 (×3): qty 0.5
  Filled 2017-12-01 (×3): qty 1
  Filled 2017-12-01 (×2): qty 0.5
  Filled 2017-12-01: qty 1
  Filled 2017-12-01: qty 0.5
  Filled 2017-12-01 (×3): qty 1
  Filled 2017-12-01 (×2): qty 0.5

## 2017-12-01 MED ORDER — AMIODARONE HCL IN DEXTROSE 360-4.14 MG/200ML-% IV SOLN
60.0000 mg/h | INTRAVENOUS | Status: DC
  Administered 2017-12-01 (×2): 60 mg/h via INTRAVENOUS
  Filled 2017-12-01 (×2): qty 200

## 2017-12-01 MED ORDER — AMIODARONE HCL IN DEXTROSE 360-4.14 MG/200ML-% IV SOLN
30.0000 mg/h | INTRAVENOUS | Status: DC
  Administered 2017-12-01 – 2017-12-04 (×6): 30 mg/h via INTRAVENOUS
  Filled 2017-12-01 (×6): qty 200

## 2017-12-01 MED ORDER — PANTOPRAZOLE SODIUM 40 MG IV SOLR
40.0000 mg | Freq: Every day | INTRAVENOUS | Status: DC
  Administered 2017-12-01 – 2017-12-03 (×3): 40 mg via INTRAVENOUS
  Filled 2017-12-01 (×3): qty 40

## 2017-12-01 NOTE — Progress Notes (Signed)
Dr. Donnetta Hutching paged and notified concerning patients request for ginger ale. Order given for clear liquids.  Also notified MD that patient is in ventricular trigeminy. No new orders at this time. Will continue to monitor.   [address], [place]

## 2017-12-01 NOTE — Progress Notes (Signed)
ANTICOAGULATION CONSULT NOTE - Follow Up Consult  Pharmacy Consult for Heparin Indication: occluded fem-pop bypass  No Known Allergies  Patient Measurements: Height: 5\' 7"  (170.2 cm) Weight: 166 lb 1.6 oz (75.3 kg) IBW/kg (Calculated) : 61.6 Heparin Dosing Weight: 73.5 kg  Vital Signs: Temp: 97.3 F (36.3 C) (01/14 0400) Temp Source: Oral (01/14 0400) BP: 98/55 (01/14 0515) Pulse Rate: 109 (01/14 0515)  Labs: Recent Labs    11/28/17 1900  11/30/17 0008 11/30/17 0206 11/30/17 0219  11/30/17 0532 11/30/17 1236 11/30/17 2218 12/01/17 0127 12/01/17 0527  HGB 11.5*   < > 8.2* 7.9*  --   --  9.3* 9.9*  --  10.0*  --   HCT 33.8*   < > 24.0* 24.2*  --   --  28.1* 30.0*  --  30.2*  --   PLT 239   < >  --  224  --   --  223 290  --  268  --   APTT 42*  --   --   --   --   --   --   --   --   --   --   LABPROT 17.0*  --   --   --   --   --   --   --   --   --   --   INR 1.40  --   --   --   --   --   --   --   --   --   --   HEPARINUNFRC  --    < >  --   --   --    < >  --  0.19* 0.30  --  0.36  CREATININE 0.70   < > 0.60 0.69  --   --   --   --   --  0.78  --   TROPONINI  --   --   --   --  <0.03  --   --   --   --   --   --    < > = values in this interval not displayed.   Estimated Creatinine Clearance: 86.1 mL/min (by C-G formula based on SCr of 0.78 mg/dL).  Medications:  Scheduled:  . Chlorhexidine Gluconate Cloth  6 each Topical Daily  . docusate sodium  100 mg Oral Daily  . hydrocortisone sodium succinate  50 mg Intravenous Q6H  . insulin aspart  0-9 Units Subcutaneous TID WC  . mouth rinse  15 mL Mouth Rinse BID  . pantoprazole sodium  40 mg Per Tube Daily  . sodium chloride flush  10-40 mL Intracatheter Q12H   Infusions:  . amiodarone 60 mg/hr (12/01/17 0429)   Followed by  . amiodarone    . heparin 1,750 Units/hr (12/01/17 0016)  . phenylephrine (NEO-SYNEPHRINE) Adult infusion 20 mcg/min (12/01/17 0432)  . 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  VVS reviewing for possible surgical intervention > went back to OR 1/11 for redo fem-pop. Hgb trending down overnight to 7.9, 1 uPRBC transfused Hgb now 9.3.  CT abdomen negative for intra-abdominal bleed. Vascular wants to continue anticoagulation with goal to achieve therapeutic levels.  Heparin level therapeutic x2; CBC stable, no overt bleeding noted over night  Goal of Therapy:  Heparin level 0.3-0.7 units/ml Monitor platelets by anticoagulation protocol: Yes   Plan:  Continue heparin gtt at 1750 units/hr  Daily heparin level and CBC Monitor for s/sx of bleeding   Jodean Lima Aaryana Betke  12/01/2017 6:31 AM

## 2017-12-01 NOTE — Care Management Note (Addendum)
Case Management Note  Patient Details  Name: [name] MRN: [id number] Date of Birth: December 08, 1965  Subjective/Objective:   From home, presents with LLE pain with occlusion of left fem pop graft, POD 3 of revision of the graft on the right and embolectomy of graft on the left, back to OR on 1/11 for revascularization of bil fem pop and placement of prevena wound vacs x 2, developed hypotension, conts on amio, hep drip, iv pain meds, neo, pt is rec SNF, but if progresses maybe HH.      1/16 [name] RN,[name]- Per CSW on 1/16 patient prefers to go home with [facility], will see how she progresses with therapy.               Action/Plan: NCM will follow for dc needs.   Expected Discharge Date:                  Expected Discharge Plan:  [place]  In-House Referral:     Discharge planning Services  CM Consult  Post Acute Care Choice:    Choice offered to:     DME Arranged:    DME Agency:     HH Arranged:    [place] Agency:     Status of Service:  In process, will continue to follow  If discussed at Long Length of Stay Meetings, dates discussed:    Additional Comments:  [name], RN 12/01/2017, 8:50 PM

## 2017-12-01 NOTE — Progress Notes (Signed)
ANTICOAGULATION CONSULT NOTE - Follow Up Consult  Pharmacy Consult for Heparin Indication: occluded fem-pop bypass  No Known Allergies  Patient Measurements: Height: 5\' 7"  (170.2 cm) Weight: 166 lb 1.6 oz (75.3 kg) IBW/kg (Calculated) : 61.6 Heparin Dosing Weight: 73.5 kg  Vital Signs: Temp: 97.4 F (36.3 C) (01/13 2019) Temp Source: Oral (01/13 2019) BP: 100/54 (01/13 2315) Pulse Rate: 101 (01/13 2315)  Labs: Recent Labs    11/28/17 0137  11/28/17 1900  11/29/17 0425  11/30/17 0008 11/30/17 0206 11/30/17 0219 11/30/17 0351 11/30/17 0532 11/30/17 1236 11/30/17 2218  HGB 10.1*   < > 11.5*   < > 9.3*  --  8.2* 7.9*  --   --  9.3* 9.9*  --   HCT 31.0*   < > 33.8*   < > 27.9*  --  24.0* 24.2*  --   --  28.1* 30.0*  --   PLT 285  --  239  --  221  --   --  224  --   --  223 290  --   APTT  --   --  42*  --   --   --   --   --   --   --   --   --   --   LABPROT 16.1*  --  17.0*  --   --   --   --   --   --   --   --   --   --   INR 1.30  --  1.40  --   --   --   --   --   --   --   --   --   --   HEPARINUNFRC 0.50  --   --   --   --    < >  --   --   --  <0.10*  --  0.19* 0.30  CREATININE 1.07*  --  0.70  --  0.85  --  0.60 0.69  --   --   --   --   --   TROPONINI  --   --   --   --   --   --   --   --  <0.03  --   --   --   --    < > = values in this interval not displayed.    Estimated Creatinine Clearance: 86.1 mL/min (by C-G formula based on SCr of 0.69 mg/dL).   Medications:  Scheduled:  . Chlorhexidine Gluconate Cloth  6 each Topical Daily  . docusate sodium  100 mg Oral Daily  . hydrocortisone sodium succinate  50 mg Intravenous Q6H  . insulin aspart  0-9 Units Subcutaneous TID WC  . mouth rinse  15 mL Mouth Rinse BID  . pantoprazole sodium  40 mg Per Tube Daily  . sodium chloride flush  10-40 mL Intracatheter Q12H   Infusions:  . heparin 1,650 Units/hr (11/30/17 2235)  . 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  VVS reviewing for possible surgical intervention > went back to OR 1/11 for redo fem-pop. Hgb trending down overnight to 7.9, 1 uPRBC transfused Hgb now 9.3.  CT abdomen negative for intra-abdominal bleed. Vascular wants to continue anticoagulation with goal to achieve therapeutic levels.  Heparin is barely therapeutic, due to extensive thrombosis  will increase heparin slightly.   Goal of Therapy:  Heparin level 0.3-0.7 units/ml Monitor platelets by anticoagulation protocol: Yes     Plan:  Increase heparin gtt to 1750 Heparin level 6 hour Daily heparin level and CBC Monitor for s/sx of bleeding   Harvel Quale  12/01/2017 12:09 AM

## 2017-12-01 NOTE — Evaluation (Signed)
Occupational Therapy Evaluation Patient Details Name: [name] MRN: [id number] DOB: 16-Jan-1966 Today's Date: 12/01/2017    History of Present Illness 52 year old female with a history of bilateral lower extremity femoral popliteal artery bypasses with vein.  These are both occluded and she also had occluded iliac systems bilaterally.  On 11/28/17 she underwent aortobifemoral bypass grafting as well as revision of the graft on the right and embolectomy of the graft on the left.  At completion the patient had left sided peroneal signal reportedly.  Back to surgery for left embolectomy and  Left popliteal and tibioperoneal trunk endarterectomy and patch angioplasty.  PMH: asthma, depression, diabetes   Clinical Impression   Pt is typically modified independent in self care and mobility. She presents with anxiety, post operative pain, generalized weakness and impaired standing balance. Pt requires 2 person assist for all mobility. She will need post acute rehab in SNF prior to return home. Will follow acutely.    Follow Up Recommendations  SNF;Supervision/Assistance - 24 hour    Equipment Recommendations       Recommendations for Other Services       Precautions / Restrictions Precautions Precautions: Fall Precaution Comments: wound vac Restrictions Weight Bearing Restrictions: No      Mobility Bed Mobility Overal bed mobility: Needs Assistance Bed Mobility: Rolling;Sidelying to Sit Rolling: Min assist Sidelying to sit: +2 for physical assistance;Max assist       General bed mobility comments: pt rolled with use of pad to complete, +2 max assist to raise trunk from sidelying and advance hips to EOB  Transfers Overall transfer level: Needs assistance Equipment used: 4-wheeled walker Transfers: Sit to/from Stand Sit to Stand: +2 physical assistance;Mod assist         General transfer comment: assist to rise and steady, verbal cues for hand placement    Balance  Overall balance assessment: Needs assistance Sitting-balance support: Bilateral upper extremity supported;Feet supported Sitting balance-Leahy Scale: Fair Sitting balance - Comments: statically able to sit without support   Standing balance support: Bilateral upper extremity supported Standing balance-Leahy Scale: Poor Standing balance comment: reliant on B UE support                           ADL either performed or assessed with clinical judgement   ADL Overall ADL's : Needs assistance/impaired Eating/Feeding: Set up;Sitting Eating/Feeding Details (indicate cue type and reason): ice chips Grooming: Moderate assistance;Sitting   Upper Body Bathing: Maximal assistance;Sitting   Lower Body Bathing: Total assistance;Sit to/from stand   Upper Body Dressing : Moderate assistance;Sitting   Lower Body Dressing: Total assistance;Sit to/from stand   Toilet Transfer: Minimal assistance;Ambulation;RW;+2 for physical assistance   Toileting- Clothing Manipulation and Hygiene: Total assistance;Sit to/from stand;+2 for physical assistance       Functional mobility during ADLs: Minimal assistance;+2 for physical assistance;Rolling walker       Vision Patient Visual Report: No change from baseline       Perception     Praxis      Pertinent Vitals/Pain Pain Assessment: Faces Faces Pain Scale: Hurts whole lot Pain Location: abdomen, groin Pain Descriptors / Indicators: Grimacing;Guarding;Operative site guarding Pain Intervention(s): Monitored during session;Repositioned     Hand Dominance Right   Extremity/Trunk Assessment Upper Extremity Assessment Upper Extremity Assessment: Generalized weakness   Lower Extremity Assessment Lower Extremity Assessment: Defer to PT evaluation   Cervical / Trunk Assessment Cervical / Trunk Assessment: Other exceptions Cervical / Trunk Exceptions: hx  of back sx and chronic pain   Communication Communication Communication: No  difficulties   Cognition Arousal/Alertness: Awake/alert Behavior During Therapy: Flat affect;Anxious Overall Cognitive Status: Impaired/Different from baseline Area of Impairment: Following commands                       Following Commands: Follows one step commands with increased time       General Comments: pt rather child like, anxiety contributing to difficulty following directions   General Comments       Exercises     Shoulder Instructions      Home Living Family/patient expects to be discharged to:: Private residence Living Arrangements: Spouse/significant other;Other relatives(father in law and brother) Available Help at Discharge: Family;Available 24 hours/day(husband works) Type of Home: House Home Access: [place]: One level     Bathroom Shower/Tub: Occupational psychologist: [place]: Environmental consultant - 2 wheels;Cane - single point;Bedside commode;Walker - 4 wheels;Shower seat          Prior Functioning/Environment Level of Independence: Independent;Independent with assistive device(s)        Comments: amb with cane and sometimes rollator        OT Problem List: Decreased strength;Decreased activity tolerance;Impaired balance (sitting and/or standing);Decreased knowledge of use of DME or AE;Pain;Decreased cognition      OT Treatment/Interventions: Self-care/ADL training;DME and/or AE instruction;Therapeutic activities;Patient/family education;Balance training;Cognitive remediation/compensation    OT Goals(Current goals can be found in the care plan section) Acute Rehab OT Goals Patient Stated Goal: to go home OT Goal Formulation: With patient Time For Goal Achievement: 12/15/17 Potential to Achieve Goals: Good ADL Goals Pt Will Perform Grooming: with set-up;sitting Pt Will Perform Upper Body Bathing: with min assist;sitting Pt Will Perform Upper Body Dressing: with set-up;sitting Pt Will  Transfer to Toilet: with min assist;ambulating;bedside commode(over toilet) Pt Will Perform Toileting - Clothing Manipulation and hygiene: with min assist;sit to/from stand Additional ADL Goal #1: Pt will perform bed mobility with moderate assist in preparation for ADL.  OT Frequency: Min 2X/week   Barriers to D/C:            Co-evaluation PT/OT/SLP Co-Evaluation/Treatment: Yes Reason for Co-Treatment: For patient/therapist safety;Complexity of the patient's impairments (multi-system involvement)   OT goals addressed during session: ADL's and self-care      AM-PAC PT "6 Clicks" Daily Activity     Outcome Measure Help from another person eating meals?: None Help from another person taking care of personal grooming?: A Lot Help from another person toileting, which includes using toliet, bedpan, or urinal?: A Lot Help from another person bathing (including washing, rinsing, drying)?: A Lot Help from another person to put on and taking off regular upper body clothing?: A Lot Help from another person to put on and taking off regular lower body clothing?: Total 6 Click Score: 13   End of Session Nurse Communication: Mobility status  Activity Tolerance: (anxiety) Patient left: in chair;with call bell/phone within reach  OT Visit Diagnosis: Unsteadiness on feet (R26.81);Other abnormalities of gait and mobility (R26.89);Pain;Muscle weakness (generalized) (M62.81);Other symptoms and signs involving cognitive function                Time: 3716-9678 OT Time Calculation (min): 32 min Charges:  OT General Charges $OT Visit: 1 Visit OT Evaluation $OT Eval Moderate Complexity: 1 Mod G-Codes:     Malka So 12/01/2017, 2:19 PM  12/01/2017 Nestor Lewandowsky,  OTR/L Pager: [phone]

## 2017-12-01 NOTE — Progress Notes (Signed)
PULMONARY / CRITICAL CARE MEDICINE   Name: [name] MRN: [id number] DOB: Mar 26, 1966    ADMISSION DATE:  11/24/2017 CONSULTATION DATE:  11/30/2017  REFERRING MD:  Dr. Donzetta Matters    CHIEF COMPLAINT:  Hypotension   HISTORY OF PRESENT ILLNESS:   52 year old female with PMH of DM, GERD, HLD, PAD, CAD  Presents 1/7 for left lower extremity pain with concern for occlusion of left femoral-popliteal bypass graft. 1/11 underwent exploration of left fem-pop bypass graft, embolectomy, and revision. Upon return to the unit patient was having pins and needle feeling of the left foot and purple discoloration with minimal cap refill. Went back to OR for revascularization of bilateral femoral to popliteal arteries, application of femoral wound vac x2. 1/13 patient with progressive hypotension, PCCM asked to consult.   SUBJECTIVE:  Having left leg pain and some nausea.  No CP or SOB. Developed atrial fib with RVR overnight with continued hypotension.  Started on amiodarone and converted to NSR  VITAL SIGNS: BP 124/74   Pulse 84   Temp (!) 97.3 F (36.3 C) (Oral)   Resp 16   Ht 5\' 7"  (1.702 m)   Wt 166 lb 1.6 oz (75.3 kg)   SpO2 98%   BMI 26.01 kg/m   HEMODYNAMICS: CVP:  [1 mmHg-9 mmHg] 7 mmHg  VENTILATOR SETTINGS:    INTAKE / OUTPUT: I/O last 3 completed shifts: In: 4611.3 [P.O.:275; I.V.:2724.6; Blood:306.7; Other:15; NG/GT:40; IV Piggyback:1250] Out: 2085 [Urine:1085; Emesis/NG output:1000]  PHYSICAL EXAMINATION: General:  Adult female, resting in bed, NAD Neuro:  Alert and interactive, moving ext to command HEENT:  [place]/AT, EOMI Neck: + right IJ Cardiovascular:  RRR, no m/r/g Lungs:  Diminished at bases otherwise CTAB Abdomen:  Soft, mild left sided tenderness, non distended, quiet BS Skin:  Left LE post surgical with staples intact.  Leg leg is warm and pink with 1+ edema.  LABS:  BMET Recent Labs  Lab 11/29/17 0425 11/30/17 0008 11/30/17 0206 12/01/17 0127  NA 134* 137  135 137  K 4.0 3.8 3.8 4.3  CL 106 100* 108 108  CO2 19*  --  22 20*  BUN 14 8 8 9   CREATININE 0.85 0.60 0.69 0.78  GLUCOSE 160* 138* 162* 113*   Electrolytes Recent Labs  Lab 11/29/17 0425 11/30/17 0206 12/01/17 0127  CALCIUM 7.5* 7.3* 8.0*  MG 1.1* 2.1 2.0  PHOS  --   --  3.6   CBC Recent Labs  Lab 11/30/17 0532 11/30/17 1236 12/01/17 0127  WBC 16.4* 21.1* 24.5*  HGB 9.3* 9.9* 10.0*  HCT 28.1* 30.0* 30.2*  PLT 223 290 268   Coag's Recent Labs  Lab 11/24/17 1852 11/28/17 0137 11/28/17 1900  APTT  --   --  42*  INR 1.18 1.30 1.40   Sepsis Markers Recent Labs  Lab 11/29/17 1045 11/29/17 1313 11/30/17 0218  LATICACIDVEN 2.5* 1.8 0.8   ABG Recent Labs  Lab 11/28/17 1915 11/29/17 0503 11/30/17 0205  PHART 7.372 7.326* 7.440  PCO2ART 41.7 36.4 25.1*  PO2ART 230* 119.0* 89.0   Liver Enzymes Recent Labs  Lab 11/24/17 1852 11/29/17 0425  AST 40 37  ALT 14 12*  ALKPHOS 188* 94  BILITOT 0.6 0.5  ALBUMIN 2.2* 1.9*   Cardiac Enzymes Recent Labs  Lab 11/30/17 0219  TROPONINI <0.03   Glucose Recent Labs  Lab 11/30/17 0802 11/30/17 1110 11/30/17 1710 11/30/17 1938 11/30/17 2205 12/01/17 0814  GLUCAP 147* 108* 114* 105* 109* 151*   Imaging  No results found. STUDIES:  CTA AO+BIFEM 1/09 > 1. Interval occlusion of the left external iliac and common femoral arteries as well as the femoral to popliteal bypass graft. This is a new finding compared to 08/11/2017. 2. Significant disease in the left common iliac artery may be slightly progressed compared to prior. 3. Additional severe right lower extremity peripheral arterial disease as detailed above without significant change. 4. Advanced aortic atherosclerosis with approximately 50% narrowing of the distal aorta. CXR 1/11 > The heart size and mediastinal contours are within normal limits. Right central venous line/vascular sheath is identified with distal tip in the superior vena cava.  Nasogastric tube is identified with distal tip not included on film. Minimal atelectasis of right lung base is noted. Both lungs are otherwise clear. The visualized skeletal structures are unremarkable. KUB 1/11 > The bowel gas pattern is normal. Nasogastric tube is identified with distal tip in the proximal stomach. Prior cholecystectomy clips are noted. Extensive bowel content is identified throughout colon. Vascular stents identified in the right iliac vessel. No radio-opaque calculi or other significant radiographic abnormality are seen. CT ABD/PELVIS 1/13 > Postsurgical changes of aortobifemoral bypass graft. There is small amount of pneumoperitoneum and diffuse mesenteric and abdominal wall edema. No hematoma. Bibasilar consolidative changes may represent atelectasis versus pneumonia  CULTURES: Blood 1/13 >>NTD   ANTIBIOTICS: None.   SIGNIFICANT EVENTS: 1/7 > Presents to Hospital    LINES/TUBES: RIJ CVC 1/11 >> Right Radial Aline 1/11 >>  Bilateral Groin Wound Vac 1/11 >> Foley 1/12 NG >> 1/12  DISCUSSION: 52 year old female presents with left lower extremity pain with concern for occlusion of left femoral-popliteal bypass graft. On 1/11 underwent aortobifemoral bypass grafting as well as revision of the graft on the right and embolectomy of the graft on the left.   ASSESSMENT / PLAN: Hypotension  Atrial Fibrillation with RVR Hypotension of unclear etiology now with atrial fibrillation with RVR that developed overnight.  She continues on phenylephrine this morning and converted back to NSR after being given amiodarone and then started on a NS fluid bolus.  BP is much better now after converting back to NSR.  Her CVP is about 6 this morning raising the concern for hypovolemia as cause of her hypotension.  Sepsis also consideration given her rising leukocytosis.  However, she is afebrile with no other obvious localizing infectious symptoms.  Blood cultures drawn yesterday  have not grown anything to date.  Cortisol level obtained yesterday was 76 and she was empirically started on Solu-Cortef.  Suspect hypovolemia may be driving factor for her current issues. -Follow up blood cultures -Phenylephrine for BP support.  Titrate to maintain MAP > 65 -She is on Heparin gtt per pharmacy -Given cortisol level, consider stopping steroids -Continue amiodarone for now -NS at 75cc/hr for 1 day after bolus -Follow urine output as she only had 0.31mL/kg/hr over last 24 hours -Will consider Echo  PVD s/p femoral-popliteal bypass grafts, thrombectomy, revision, revascularization, and placement of bilateral femoral Provena wound vac -Per vascular surgery -Heparin per pharmacy  Leukocytosis Given her rising leukocytosis and hypotension requiring pressors in addition to atrial fibrillation with RVR, there is concern for an underlying infectious etiology.  Fortunately, she does not otherwise exhibit infectious symptoms and she has been afebrile.  Urinalysis from yesterday was not indicative of infection.  Blood cultures have been negative to date and she remains off antibiotics. -Continue to follow off antibiotics -Follow WBC, monitor fever curve  Anemia Hemoglobin stable s/p PRBC  on 1/13  GI  She is NPO and on PPI prophylaxis.  VVS with plans to clamp NG tube to see how she does from a nausea standpoint and whether it can be discontinued.  DM She is on sliding scale insulin.  FAMILY  - Updates: No family at bedside  - Inter-disciplinary family meet or Palliative Care meeting due by: 12/07/2017  Jule Ser, PGY3 12/01/2017, 11:13 AM

## 2017-12-01 NOTE — Progress Notes (Signed)
Physical Therapy Treatment Patient Details Name: [name] MRN: [id number] DOB: August 24, 1966 Today's Date: 12/01/2017    History of Present Illness 52 year old female with a history of bilateral lower extremity femoral popliteal artery bypasses with vein.  These are both occluded and she also had occluded iliac systems bilaterally.  On 11/28/17 she underwent aortobifemoral bypass grafting as well as revision of the graft on the right and embolectomy of the graft on the left.  At completion the patient had left sided peroneal signal reportedly.  Back to surgery for left embolectomy and  Left popliteal and tibioperoneal trunk endarterectomy and patch angioplasty.  PMH: asthma, depression, diabetes    PT Comments    Pt ambulated 20' with RW and min A +2. Required mod A +2 for sit to stand. Pt very anxious throughout but progressing with mobility. PT will continue to follow.    Follow Up Recommendations  SNF;Supervision/Assistance - 24 hour     Equipment Recommendations  None recommended by PT    Recommendations for Other Services       Precautions / Restrictions Precautions Precautions: Fall Precaution Comments: wound vac Restrictions Weight Bearing Restrictions: No    Mobility  Bed Mobility Overal bed mobility: Needs Assistance Bed Mobility: Rolling;Sidelying to Sit Rolling: Min assist Sidelying to sit: +2 for physical assistance;Max assist Supine to sit: Max assist;+2 for physical assistance;HOB elevated     General bed mobility comments: pt rolled with use of pad to complete, +2 max assist to raise trunk from sidelying and advance hips to EOB  Transfers Overall transfer level: Needs assistance Equipment used: 4-wheeled walker Transfers: Sit to/from Stand Sit to Stand: +2 physical assistance;Mod assist         General transfer comment: assist to rise and steady, verbal cues for hand placement  Ambulation/Gait Ambulation/Gait assistance: Min assist;+2 physical  assistance Ambulation Distance (Feet): 20 Feet Assistive device: Rolling walker (2 wheeled) Gait Pattern/deviations: Step-through pattern;Decreased stride length;Trunk flexed Gait velocity: decreased Gait velocity interpretation: <1.8 ft/sec, indicative of risk for recurrent falls General Gait Details: pt anxious throughout ambulation but able to walk around bed and to doorway of room. Very fatigued after ambulation.    Stairs            Wheelchair Mobility    Modified Rankin (Stroke Patients Only)       Balance Overall balance assessment: Needs assistance Sitting-balance support: Bilateral upper extremity supported;Feet supported Sitting balance-Leahy Scale: Fair Sitting balance - Comments: statically able to sit without support   Standing balance support: Bilateral upper extremity supported Standing balance-Leahy Scale: Poor Standing balance comment: reliant on B UE support                            Cognition Arousal/Alertness: Awake/alert Behavior During Therapy: Flat affect;Anxious Overall Cognitive Status: Impaired/Different from baseline Area of Impairment: Following commands                       Following Commands: Follows one step commands with increased time       General Comments: pt rather child like, anxiety contributing to difficulty following directions      Exercises      General Comments General comments (skin integrity, edema, etc.): pt unwilling to ambulate unless rollator available. BP 118/72 before session, 102/73 after. HR 86 bpm. O2 sats 96% on RA      Pertinent Vitals/Pain Pain Assessment: Faces Faces Pain Scale: Hurts whole  lot Pain Location: abdomen, groin, back Pain Descriptors / Indicators: Grimacing;Guarding;Operative site guarding Pain Intervention(s): Limited activity within patient's tolerance;Monitored during session;Repositioned    Home Living Family/patient expects to be discharged to:: Private  residence Living Arrangements: Spouse/significant other;Other relatives(father in law and brother) Available Help at Discharge: Family;Available 24 hours/day(husband works) Type of Home: House Home Access: [place]: One [place]: Environmental consultant - 2 wheels;Cane - single point;Bedside commode;Walker - 4 wheels;Shower seat      Prior Function Level of Independence: Independent;Independent with assistive device(s)      Comments: amb with cane and sometimes rollator   PT Goals (current goals can now be found in the care plan section) Acute Rehab PT Goals Patient Stated Goal: to go home PT Goal Formulation: With patient Time For Goal Achievement: 12/13/17 Potential to Achieve Goals: Good Progress towards PT goals: Progressing toward goals    Frequency    Min 3X/week      PT Plan Current plan remains appropriate    Co-evaluation PT/OT/SLP Co-Evaluation/Treatment: Yes Reason for Co-Treatment: For patient/therapist safety PT goals addressed during session: Mobility/safety with mobility;Balance;Proper use of DME;Strengthening/ROM OT goals addressed during session: ADL's and self-care      AM-PAC PT "6 Clicks" Daily Activity  Outcome Measure  Difficulty turning over in bed (including adjusting bedclothes, sheets and blankets)?: Unable Difficulty moving from lying on back to sitting on the side of the bed? : Unable Difficulty sitting down on and standing up from a chair with arms (e.g., wheelchair, bedside commode, etc,.)?: Unable Help needed moving to and from a bed to chair (including a wheelchair)?: A Lot Help needed walking in hospital room?: A Lot Help needed climbing 3-5 steps with a railing? : Total 6 Click Score: 8    End of Session Equipment Utilized During Treatment: (wound vac) Activity Tolerance: Patient limited by fatigue;Patient limited by pain Patient left: in chair;with call bell/phone within reach Nurse Communication: Mobility  status PT Visit Diagnosis: Unsteadiness on feet (R26.81);Muscle weakness (generalized) (M62.81);Pain Pain - part of body: (abdomen, back)     Time: 1610-9604 PT Time Calculation (min) (ACUTE ONLY): 36 min  Charges:  $Gait Training: 8-22 mins                    G Codes:       Leighton Roach, PT  Acute Rehab Services  [place] 12/01/2017, 2:41 PM

## 2017-12-01 NOTE — Progress Notes (Addendum)
Subjective  - POD #3  Still with some abdominal pain, particularly in the left lower quadrant.  Was able to stand and walk with a walker. A. fib with RVR overnight  Physical Exam:  Brisk posterior tibial Doppler signal on the left and right anterior tibial signal. The left foot appears less dusky now, with only discoloration on the lateral aspect of the foot and fifth toe with some blistering. Midline incision is well-healed.  Her abdomen is soft.    Assessment/Plan:  POD #3  GI: The patient reports that she is passing flatus.  She is still having some nausea.  She had 600 cc of NG tube output yesterday.  Therefore I will clamp her NG tube for 6 hours.  If she does well with this he can be removed. Vascular: The patient has good Doppler signals in both feet down to the ankle.  There is some discoloration along the lateral aspect of the left foot with blistering.  Hopefully this will improve over time.  In the meantime, I will continue her IV heparin infusion. Heme: Acute postoperative blood loss anemia.  The patient has been transfused several times.  Her hemoglobin is stable today. Diabetes: Continue on insulin drip Renal: Adequate urine output.  Foley remains in place.  Will keep Foley in 1 more day for adequate quantification of urine output. A. fib: Patient is now on amiodarone and is rate controlled. ID: Persistent leukocytosis.  Hopefully this is just demargination.  She was given steroids yesterday.  We will continue to monitor.  No role for antibiotics currently.  Wells Brabham 12/01/2017 11:53 AM --  Vitals:   12/01/17 1015 12/01/17 1100  BP: 128/73 124/74  Pulse: 81 84  Resp: 13 16  Temp:    SpO2: 97% 98%    Intake/Output Summary (Last 24 hours) at 12/01/2017 1153 Last data filed at 12/01/2017 1100 Gross per 24 hour  Intake 1514.96 ml  Output 735 ml  Net 779.96 ml     Laboratory CBC    Component Value Date/Time   WBC 24.5 (H) 12/01/2017 0127   HGB 10.0  (L) 12/01/2017 0127   HCT 30.2 (L) 12/01/2017 0127   PLT 268 12/01/2017 0127    BMET    Component Value Date/Time   NA 137 12/01/2017 0127   K 4.3 12/01/2017 0127   CL 108 12/01/2017 0127   CO2 20 (L) 12/01/2017 0127   GLUCOSE 113 (H) 12/01/2017 0127   BUN 9 12/01/2017 0127   CREATININE 0.78 12/01/2017 0127   CREATININE 1.24 (H) 08/12/2017 1530   CALCIUM 8.0 (L) 12/01/2017 0127   GFRNONAA >60 12/01/2017 0127   GFRNONAA 50 (L) 08/12/2017 1530   GFRAA >60 12/01/2017 0127   GFRAA 57 (L) 08/12/2017 1530    COAG Lab Results  Component Value Date   INR 1.40 11/28/2017   INR 1.30 11/28/2017   INR 1.18 11/24/2017   No results found for: PTT  Antibiotics Anti-infectives (From admission, onward)   Start     Dose/Rate Route Frequency Ordered Stop   11/28/17 2200  cefUROXime (ZINACEF) 1.5 g in dextrose 5 % 50 mL IVPB     1.5 g 100 mL/hr over 30 Minutes Intravenous Every 12 hours 11/28/17 2003 11/29/17 2159   11/27/17 0830  ceFAZolin (ANCEF) IVPB 2g/100 mL premix  Status:  Discontinued     2 g 200 mL/hr over 30 Minutes Intravenous To Short Stay 11/27/17 0813 11/28/17 0830   11/25/17 1400  azithromycin (ZITHROMAX)  tablet 500 mg  Status:  Discontinued    Comments:  Please dose appropriately for sinus infection   500 mg Oral Daily 11/25/17 1257 11/28/17 2003   11/24/17 2200  doxycycline (VIBRA-TABS) tablet 100 mg  Status:  Discontinued     100 mg Oral 2 times daily 11/24/17 1807 11/28/17 2003       V. Leia Alf, M.D. [facility] Office: [phone] Pager:  [phone]

## 2017-12-01 NOTE — Progress Notes (Signed)
[facility] Progress Note Patient Name: [name] DOB: 11-Aug-1966 MRN: [id number]   Date of Service  12/01/2017  HPI/Events of Note  Multiple issues: 1. AFIB with RVR - BP = 80/60 and 2. Oliguria - Last CVP = 2.  eICU Interventions  Will order: 1. Amiodarone IV load and infusion. 2. Send AM CBC, BMP. Mg++ and PO4--- now.  3. Bolus with 0.9 NaCl 1 liter IV over 1 hour now.      Intervention Category Major Interventions: Arrhythmia - evaluation and management Intermediate Interventions: Oliguria - evaluation and management  [name] Eugene 12/01/2017, 1:14 AM

## 2017-12-01 NOTE — Progress Notes (Signed)
Patient converted to NSR.  1L bolus intiated based on prior order to give after conversion to SR.   [address] [place] Guiles A

## 2017-12-01 NOTE — Progress Notes (Signed)
[facility] and oriented.  Motivated to get better.   Objective (!) 88/54 (!) 120 (!) 97.3 F (36.3 C) (Oral) 17 99%  Intake/Output Summary (Last 24 hours) at 12/01/2017 0800 Last data filed at 12/01/2017 0700 Gross per 24 hour  Intake 1504.28 ml  Output 1180 ml  Net 324.28 ml    Doppler signal AT/DP/PT/peroneal B, left peroneal best signal  Lower leg incision healing Foot appearance with lateral fifth met head blister, ischemic changes to forth and fifth, GT plantar.  Edema LE into the foot.  Abdominal incision healing well, left LQ tenderness, minimal BS Groins with previna wound vacs in place, soft surrounding tissue without evidence of hematoma. Heart A fib  Lungs [place] O2   Assessment/Planning: POD # 3 open aorto bifemoral bypass.  Taken back later that night for left LE ischemia: Procedure Performed: 1.  Re-exposure of left below knee arteries > 30 days 2.  Embolectomy of left lower extremity 3.  Left popliteal and tibioperoneal trunk endarterectomy and patch angioplasty 4.  Harvest of left lesser saphenous vein New A fib  Patient went into A fib last night CCM started her on Bolus of amiodarone now on drip.  She has had a decrease in UO now 15-30 q Hr.  CCM wants to hold off on fluid bolus. She has passed flatus, so we will clamp the NG tube, if no nausea we will d/c the NG tube. We will increase frequency Dilaudid for pain control. Left foot appearance improving toes are demarcating we will watch.  Encourage active range of motion. Bypass patent with with three vessel signal B.   [name] 12/01/2017 8:00 AM --  Laboratory Lab Results: Recent Labs    11/30/17 1236 12/01/17 0127  WBC 21.1* 24.5*  HGB 9.9* 10.0*  HCT 30.0* 30.2*  PLT 290 268   BMET Recent Labs    11/30/17 0206 12/01/17 0127  NA 135 137  K 3.8 4.3  CL 108 108  CO2 22 20*  GLUCOSE 162* 113*  BUN 8 9  CREATININE 0.69  0.78  CALCIUM 7.3* 8.0*    COAG Lab Results  Component Value Date   INR 1.40 11/28/2017   INR 1.30 11/28/2017   INR 1.18 11/24/2017   No results found for: PTT

## 2017-12-02 ENCOUNTER — Inpatient Hospital Stay (HOSPITAL_COMMUNITY): Payer: Medicare Other

## 2017-12-02 ENCOUNTER — Encounter (HOSPITAL_COMMUNITY): Payer: Self-pay | Admitting: Physician Assistant

## 2017-12-02 DIAGNOSIS — T82898A Other specified complication of vascular prosthetic devices, implants and grafts, initial encounter: Secondary | ICD-10-CM

## 2017-12-02 DIAGNOSIS — J9691 Respiratory failure, unspecified with hypoxia: Secondary | ICD-10-CM | POA: Diagnosis present

## 2017-12-02 DIAGNOSIS — I959 Hypotension, unspecified: Secondary | ICD-10-CM

## 2017-12-02 DIAGNOSIS — I480 Paroxysmal atrial fibrillation: Secondary | ICD-10-CM

## 2017-12-02 DIAGNOSIS — T82898S Other specified complication of vascular prosthetic devices, implants and grafts, sequela: Secondary | ICD-10-CM

## 2017-12-02 DIAGNOSIS — D72829 Elevated white blood cell count, unspecified: Secondary | ICD-10-CM

## 2017-12-02 HISTORY — DX: Paroxysmal atrial fibrillation: I48.0

## 2017-12-02 HISTORY — DX: Other specified complication of vascular prosthetic devices, implants and grafts, initial encounter: T82.898A

## 2017-12-02 LAB — CBC
HEMATOCRIT: 28.2 % — AB (ref 36.0–46.0)
Hemoglobin: 9.5 g/dL — ABNORMAL LOW (ref 12.0–15.0)
MCH: 29 pg (ref 26.0–34.0)
MCHC: 33.7 g/dL (ref 30.0–36.0)
MCV: 86 fL (ref 78.0–100.0)
PLATELETS: 336 10*3/uL (ref 150–400)
RBC: 3.28 MIL/uL — AB (ref 3.87–5.11)
RDW: 15.2 % (ref 11.5–15.5)
WBC: 27.9 10*3/uL — ABNORMAL HIGH (ref 4.0–10.5)

## 2017-12-02 LAB — TROPONIN I: Troponin I: 0.14 ng/mL (ref <0.03)

## 2017-12-02 LAB — GLUCOSE, CAPILLARY
GLUCOSE-CAPILLARY: 101 mg/dL — AB (ref 65–99)
GLUCOSE-CAPILLARY: 104 mg/dL — AB (ref 65–99)
GLUCOSE-CAPILLARY: 96 mg/dL (ref 65–99)
Glucose-Capillary: 85 mg/dL (ref 65–99)
Glucose-Capillary: 101 mg/dL — ABNORMAL HIGH (ref 65–99)

## 2017-12-02 LAB — BASIC METABOLIC PANEL
ANION GAP: 7 (ref 5–15)
BUN: 14 mg/dL (ref 6–20)
CHLORIDE: 107 mmol/L (ref 101–111)
CO2: 22 mmol/L (ref 22–32)
CREATININE: 0.66 mg/dL (ref 0.44–1.00)
Calcium: 8 mg/dL — ABNORMAL LOW (ref 8.9–10.3)
GFR calc non Af Amer: >60 mL/min (ref >60)
Glucose, Bld: 127 mg/dL — ABNORMAL HIGH (ref 65–99)
POTASSIUM: 3.7 mmol/L (ref 3.5–5.1)
SODIUM: 136 mmol/L (ref 135–145)

## 2017-12-02 LAB — PROCALCITONIN: PROCALCITONIN: 0.37 ng/mL

## 2017-12-02 LAB — HEPARIN LEVEL (UNFRACTIONATED): Heparin Unfractionated: 0.37 IU/mL (ref 0.30–0.70)

## 2017-12-02 MED ORDER — OXYCODONE-ACETAMINOPHEN 5-325 MG PO TABS
1.0000 | ORAL_TABLET | ORAL | Status: DC | PRN
  Administered 2017-12-02 – 2017-12-03 (×6): 1 via ORAL
  Filled 2017-12-02 (×6): qty 1

## 2017-12-02 NOTE — Progress Notes (Signed)
Progress Note  Patient Name: [name] Date of Encounter: 12/02/2017  Primary Cardiologist: Nelva Bush, MD   Subjective   Ms Polyak has been having nausea recently. She has not had chest pain (except w/ deep inspiration), no SOB at rest, denies PND, orthopnea. She was unaware of the atrial fib and the PVCs, does not have palpitations.  Inpatient Medications    Scheduled Meds: . Chlorhexidine Gluconate Cloth  6 each Topical Daily  . docusate sodium  100 mg Oral Daily  . insulin aspart  0-9 Units Subcutaneous TID WC  . mouth rinse  15 mL Mouth Rinse BID  . pantoprazole (PROTONIX) IV  40 mg Intravenous Daily  . sodium chloride flush  10-40 mL Intracatheter Q12H   Continuous Infusions: . amiodarone 30 mg/hr (12/02/17 1217)  . heparin 1,750 Units/hr (12/02/17 0700)  . phenylephrine (NEO-SYNEPHRINE) Adult infusion 40 mcg/min (12/02/17 1216)   PRN Meds: acetaminophen **OR** acetaminophen, alum & mag hydroxide-simeth, bisacodyl, guaiFENesin-dextromethorphan, hydrALAZINE, HYDROmorphone (DILAUDID) injection, labetalol, metoprolol tartrate, ondansetron, oxyCODONE-acetaminophen, phenol, sodium chloride flush   Vital Signs    Vitals:   12/02/17 1100 12/02/17 1200 12/02/17 1249 12/02/17 1300  BP: (!) 104/54 (!) 87/51  122/76  Pulse: 71 72  (!) 30  Resp: 14 12  15   Temp:   (!) 97.2 F (36.2 C)   TempSrc:   Axillary   SpO2: 98% 99%  100%  Weight:      Height:        Intake/Output Summary (Last 24 hours) at 12/02/2017 1322 Last data filed at 12/02/2017 1300 Gross per 24 hour  Intake 3410.53 ml  Output 375 ml  Net 3035.53 ml   Filed Weights   11/27/17 0444 11/28/17 0354 12/02/17 0500  Weight: 157 lb 3.2 oz (71.3 kg) 166 lb 1.6 oz (75.3 kg) 193 lb 9 oz (87.8 kg)    Telemetry    SR w/ frequent PVCs and trigeminy.  NOw  Atrial fib, RVR w/ PVCs seen am of 01/13 thru am of 01/14 - Personally Reviewed  ECG    01/15, SR w/ frequent PVCs - Personally Reviewed  Physical  Exam   GEN: WD, female, No acute distress.   Neck: No JVD seen  Cardiac: Slightly irreg R&R, no murmurs, rubs, or gallops.  Respiratory: few scattered rales bilaterally. GI: Soft, nontender, non-distended  MS: +LLE edema; incisions w/out signs infection, no [name] edema, palpable DP pulse on R. U/A to palpate L DP pulse due to edema (per RN can doppler it). Blackening on some toes L foot Neuro:  Nonfocal  Psych: Normal affect   Labs    Chemistry Recent Labs  Lab 11/29/17 0425  11/30/17 0206 12/01/17 0127 12/02/17 0330  NA 134*   < > 135 137 136  K 4.0   < > 3.8 4.3 3.7  CL 106   < > 108 108 107  CO2 19*  --  22 20* 22  GLUCOSE 160*   < > 162* 113* 127*  BUN 14   < > 8 9 14   CREATININE 0.85   < > 0.69 0.78 0.66  CALCIUM 7.5*  --  7.3* 8.0* 8.0*  PROT 4.2*  --   --   --   --   ALBUMIN 1.9*  --   --   --   --   AST 37  --   --   --   --   ALT 12*  --   --   --   --  ALKPHOS 94  --   --   --   --   BILITOT 0.5  --   --   --   --   GFRNONAA >60  --  >60 >60 >60  GFRAA >60  --  >60 >60 >60  ANIONGAP 9  --  5 9 7    < > = values in this interval not displayed.     Hematology Recent Labs  Lab 11/30/17 1236 12/01/17 0127 12/02/17 0330  WBC 21.1* 24.5* 27.9*  RBC 3.55* 3.53* 3.28*  HGB 9.9* 10.0* 9.5*  HCT 30.0* 30.2* 28.2*  MCV 84.5 85.6 86.0  MCH 27.9 28.3 29.0  MCHC 33.0 33.1 33.7  RDW 14.5 14.8 15.2  PLT 290 268 336    Cardiac Enzymes Recent Labs  Lab 11/30/17 0219 12/02/17 0330 12/02/17 0930  TROPONINI <0.03 0.14* <0.03      BNPNo results for input(s): BNP, PROBNP in the last 168 hours.    Radiology    Dg Chest Port 1 View  Result Date: 12/02/2017 CLINICAL DATA:  52 year old female with shortness of breath. EXAM: PORTABLE CHEST 1 VIEW COMPARISON:  Chest x-ray 11/30/2017. FINDINGS: Previously noted nasogastric tube has been removed. Right internal jugular Vas-Cath in position with tip terminating in the mid superior vena cava. Lung volumes are low.  There is cephalization of the pulmonary vasculature and slight indistinctness of the interstitial markings suggestive of mild pulmonary edema. Probable small left pleural effusion. Heart size appears upper limits of normal. The patient is rotated to the left on today's exam, resulting in distortion of the mediastinal contours and reduced diagnostic sensitivity and specificity for mediastinal pathology. IMPRESSION: 1. The appearance the chest suggests a background of mild interstitial pulmonary edema. 2. Small left pleural effusion. Electronically Signed   By: Vinnie Langton M.D.   On: 12/02/2017 10:41    Cardiac Studies   Myoview 03/12/2017 Pharmacological myocardial perfusion imaging study with no significant Ischemia Large region of fixed perfusion defect in the basal to mid inferior wall, inferior lateral mid to distal wall concerning for previous MI  Hypokinesis noted in the basal to mid inferior wall, EF estimated at 43% No EKG changes concerning for ischemia at peak stress or in recovery. Moderate to high risk and given size of perfusion defect detailed above  Echo:  04/17/2017   - Technically difficult study due to chest wall and/or lung   interference. - Left ventricle: The cavity size was normal. Wall thickness was   increased in a pattern of mild LVH. Systolic function was normal.   The estimated ejection fraction was in the range of 55% to 60%.   The study is not technically sufficient to allow evaluation of LV   diastolic function. - Mitral valve: Calcified annulus. Mildly thickened leaflets . - Right ventricle: The cavity size was normal. Systolic function   was normal. - Pericardium, extracardiac: A trivial pericardial effusion was   identified.  Patient Profile     52 y.o. 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 Cards saw preop, no further eval. 01/15, cards asked to reassess for atrial fib, RVR.   Assessment & Plan      1. Paroxysmal atrial fibrillation with rapid ventricular response (HCC) - pt went into rapid atrial  fib 01/13 am and spontaneously converted 01/14 am - pt was started on IV amio, is on this at 30 mg/hr - she is taking liquids,  - No BB/CCB while on neo - ck TSH in am, ck LFTs also - on heparin for anticoag, change to po when ok w/ VVS/CCM - CHA2DS2VASc= 4 (female, HTN, DM, PAD)  2.  Femoral-popliteal bypass graft occlusion, left (HCC) w/ R Iliac occlusion - s/p aortobifemoral bypass grafting as well as revision of the graft on the right and embolectomy of the graft on the left  - per VVS and CCM  3.  Shock ([place]) - pt placed on neo, required up to 60 mcg/min early this am for pressure support, has been weaned down to current dose of 40 mcg/min  4.  Respiratory failure with hypoxia ([place]) - did not require intubation - currently maintaining O2 sats on N/C  5.  Thrombosis - per VVS/CCM  6.  Leukocytosis - Per CCM, afebrile - not on ABX now  For questions or updates, please contact [facility] Please consult [email or web address] for contact info under Cardiology/STEMI.      Signed, Rosaria Ferries, PA-C  12/02/2017, 1:22 PM     Patient seen and examined  I agree with findings as noted by R Barrett above  Pt is a 52 yo with severe PAD  Now s/p aortobifem bypass grafting  Developed atrial fibrillation    On exam:  Pt in moderate pain Neck:  JVP normal  Lungs  Rel clear Cardiac exam:  Irreg irreg  No signif murmurs  Abd Deferred  Ext  Tr edema R  1+ edema LLE Wound site OK  Would continue amiodarone IV for now to get load  Keep an current rate    Follow I/O  Closely   Will continue to follow.    Dorris Carnes

## 2017-12-02 NOTE — Progress Notes (Addendum)
[facility] Progress Note    12/02/2017 7:42 AM 4 Days Post-Op  Subjective:  Says she feels better and getting a little feeling back in her left foot.  RN states she is getting dilaudid every 2 hrs and inquires about this.  Tolerating clear liquids  Afebrile x 24 hrs 70's-90's NSR (converted from Afib yesterday) 00'X-381'W systolic 29% RA (now 93% on O2)  Gtts:  Neo Amiodarone Saline @ 75cc/hr Heparin  Abx:  None at this time  Vitals:   12/02/17 0700 12/02/17 0715  BP: 121/62 (!) 128/58  Pulse: (!) 136 (!) 30  Resp: 16 16  Temp:    SpO2: 96% 91%    Physical Exam: Cardiac:  regular Lungs:  Non labored Abdomen:  Soft, NT/ND; +BS; -BM; +flatus Incisions:  Midline incision is clean and dry; bilateral groins with Pravena vacs with good seals.   Extremities:  Brisk DP/PT/peroneal doppler signals bilaterally       CBC    Component Value Date/Time   WBC 27.9 (H) 12/02/2017 0330   RBC 3.28 (L) 12/02/2017 0330   HGB 9.5 (L) 12/02/2017 0330   HCT 28.2 (L) 12/02/2017 0330   PLT 336 12/02/2017 0330   MCV 86.0 12/02/2017 0330   MCH 29.0 12/02/2017 0330   MCHC 33.7 12/02/2017 0330   RDW 15.2 12/02/2017 0330   LYMPHSABS 2,760 08/12/2017 1530   MONOABS 558 05/12/2017 1034   EOSABS 200 08/12/2017 1530   BASOSABS 37 08/12/2017 1530    BMET    Component Value Date/Time   NA 136 12/02/2017 0330   K 3.7 12/02/2017 0330   CL 107 12/02/2017 0330   CO2 22 12/02/2017 0330   GLUCOSE 127 (H) 12/02/2017 0330   BUN 14 12/02/2017 0330   CREATININE 0.66 12/02/2017 0330   CREATININE 1.24 (H) 08/12/2017 1530   CALCIUM 8.0 (L) 12/02/2017 0330   GFRNONAA >60 12/02/2017 0330   GFRNONAA 50 (L) 08/12/2017 1530   GFRAA >60 12/02/2017 0330   GFRAA 57 (L) 08/12/2017 1530    INR    Component Value Date/Time   INR 1.40 11/28/2017 1900     Intake/Output Summary (Last 24 hours) at 12/02/2017 0742 Last data filed at 12/02/2017 0700 Gross per 24 hour  Intake 2918.41 ml  Output 405  ml  Net 2513.41 ml     Assessment/Plan:  52 y.o. female is s/p  Procedure:   #1: Aortobifemoral bypass graft using a14 x 7 bifurcated Dacron graft                         #2: Reimplantation, inferior mesenteric artery                         #3: Redo bilateral common femoral artery exposure                         #4: Thrombectomy of bilateral femoral below-knee popliteal artery bypass graft                         #5: Reimplantation of left femoral-popliteal bypass graft to the aortobifemoral graft                         #6: Revision of proximal portion of right femoral popliteal bypass graft using a interposition 6 mm propatent Gore-Tex  graft                         #7: Left lower extremity angiogram                         #8: Intra-arterial administration of nitroglycerin                         #9: Left common femoral and profunda femoral endarterectomy                         #10: Aortic endarterectomy                             #11:  Placement of bilateral femoral Provena wound vacs 5 Days Post-Op and Procedure Performed: 1.  Re-exposure of left below knee arteries > 30 days 2.  Embolectomy of left lower extremity 3.  Left popliteal and tibioperoneal trunk endarterectomy and patch angioplasty 4.  Harvest of left lesser saphenous vein 4 Days Post-Op   CV:  pt doing well this am-still requiring neo for support; brisk doppler signals bilateral feet.  Continues with discoloration and blistering of left lateral foot & great toe-continue to demarcate.  Continue heparin gtt GI:  NGT out-Tolerating clear liquids; passing flatus but no BM yet-may need dulcolax supp Heme:  Acute blood loss anemia stable although down slightly Diabetes:  Insulin gtt off-only on SSI now;  BS 120's-130's Renal:  BUN/Cr normal-UOP documentation is not accurate-making adequate urine ID:  Continues to have leukocytosis-increased from yesterday and now 27.9k.  Pt has remained  afebrile.  All incisions look ok-may need to remove Pravena to inspect groin incisions-will d/w Dr. Trula Slade.    -continue to mobilize OOB and use IS every hour -will add oral pain medication to pain regimen  Leontine Locket, PA-C [facility] [phone] 12/02/2017 7:42 AM   I have seen and evaluated the patient and agree with the above.  [name]

## 2017-12-02 NOTE — Progress Notes (Signed)
ANTICOAGULATION CONSULT NOTE - Follow Up Consult  Pharmacy Consult for Heparin Indication: occluded fem-pop bypass  No Known Allergies  Patient Measurements: Height: 5\' 7"  (170.2 cm) Weight: 193 lb 9 oz (87.8 kg) IBW/kg (Calculated) : 61.6 Heparin Dosing Weight: 73.5 kg  Vital Signs: Temp: 97.2 F (36.2 C) (01/15 1249) Temp Source: Axillary (01/15 1249) BP: 122/76 (01/15 1300) Pulse Rate: 30 (01/15 1300)  Labs: Recent Labs    11/30/17 0206 11/30/17 0219  11/30/17 1236 11/30/17 2218 12/01/17 0127 12/01/17 0527 12/02/17 0330 12/02/17 0338 12/02/17 0930  HGB 7.9*  --    < > 9.9*  --  10.0*  --  9.5*  --   --   HCT 24.2*  --    < > 30.0*  --  30.2*  --  28.2*  --   --   PLT 224  --    < > 290  --  268  --  336  --   --   HEPARINUNFRC  --   --    < > 0.19* 0.30  --  0.36  --  0.37  --   CREATININE 0.69  --   --   --   --  0.78  --  0.66  --   --   TROPONINI  --  <0.03  --   --   --   --   --  0.14*  --  <0.03   < > = values in this interval not displayed.    Estimated Creatinine Clearance: 92.6 mL/min (by C-G formula based on SCr of 0.66 mg/dL).   Medications:  Scheduled:  . Chlorhexidine Gluconate Cloth  6 each Topical Daily  . docusate sodium  100 mg Oral Daily  . insulin aspart  0-9 Units Subcutaneous TID WC  . mouth rinse  15 mL Mouth Rinse BID  . pantoprazole (PROTONIX) IV  40 mg Intravenous Daily  . sodium chloride flush  10-40 mL Intracatheter Q12H   Infusions:  . amiodarone 30 mg/hr (12/02/17 1217)  . heparin 1,750 Units/hr (12/02/17 0700)  . phenylephrine (NEO-SYNEPHRINE) Adult infusion 40 mcg/min (12/02/17 1216)    Assessment: 52 yo F who presented with LLE pain and presummed recurrent restenosis of LE bypass graft.  VVS reviewing for possible surgical intervention > went back to OR 1/11 for redo fem-pop. She was is afib but currently back in NSR. Per VVS- Continuing heparin sue to appearance of foot.  -heparin level is at goal on 1750  units/hr  Goal of Therapy:  Heparin level 0.3-0.7 units/ml Monitor platelets by anticoagulation protocol: Yes    Plan:  -No heparin changes needed -Continue daily heparin level and CBC  Hildred Laser, Pharm D 12/02/2017 2:15 PM

## 2017-12-02 NOTE — Progress Notes (Signed)
PULMONARY / CRITICAL CARE MEDICINE   Name: [name] MRN: [id number] DOB: November 11, 1966    ADMISSION DATE:  11/24/2017 CONSULTATION DATE:  11/30/2017  REFERRING MD:  Dr. Donzetta Matters    CHIEF COMPLAINT:  Hypotension   HISTORY OF PRESENT ILLNESS:   52 year old female with PMH of DM, GERD, HLD, PAD, CAD  Presents 1/7 for left lower extremity pain with concern for occlusion of left femoral-popliteal bypass graft. 1/11 underwent exploration of left fem-pop bypass graft, embolectomy, and revision. Upon return to the unit patient was having pins and needle feeling of the left foot and purple discoloration with minimal cap refill. Went back to OR for revascularization of bilateral femoral to popliteal arteries, application of femoral wound vac x2. 1/13 patient with progressive hypotension, PCCM asked to consult.   SUBJECTIVE:  Doing better this morning.  NG tube is out.  Tolerating clear liquid diet.  RN reports that I/Os likely inaccurate and that urine output has been adequate.  VITAL SIGNS: BP (!) 132/58   Pulse (!) 32   Temp 97.6 F (36.4 C) (Oral)   Resp (!) 22   Ht 5\' 7"  (1.702 m)   Wt 193 lb 9 oz (87.8 kg)   SpO2 100%   BMI 30.32 kg/m   HEMODYNAMICS:    VENTILATOR SETTINGS:    INTAKE / OUTPUT: I/O last 3 completed shifts: In: 3893.5 [P.O.:125; I.V.:3733.5; Other:15; NG/GT:20] Out: 790 [Urine:590; Emesis/NG output:200]  PHYSICAL EXAMINATION: General:  Adult female, resting in bed, NAD Neuro:  Alert and interactive, moving ext to command HEENT:  [place]/AT, EOMI Neck: + right IJ Cardiovascular:  RRR, no m/r/g Lungs:  Diminished at bases otherwise CTAB Abdomen:  Soft, mild left sided tenderness, non distended, quiet BS Skin:  Left LE post surgical with staples intact.  There is discoloration and blistering of left great toe and lateral foot. 1+ edema present.  LABS:  BMET Recent Labs  Lab 11/30/17 0206 12/01/17 0127 12/02/17 0330  NA 135 137 136  K 3.8 4.3 3.7  CL 108  108 107  CO2 22 20* 22  BUN 8 9 14   CREATININE 0.69 0.78 0.66  GLUCOSE 162* 113* 127*   Electrolytes Recent Labs  Lab 11/29/17 0425 11/30/17 0206 12/01/17 0127 12/02/17 0330  CALCIUM 7.5* 7.3* 8.0* 8.0*  MG 1.1* 2.1 2.0  --   PHOS  --   --  3.6  --    CBC Recent Labs  Lab 11/30/17 1236 12/01/17 0127 12/02/17 0330  WBC 21.1* 24.5* 27.9*  HGB 9.9* 10.0* 9.5*  HCT 30.0* 30.2* 28.2*  PLT 290 268 336   Coag's Recent Labs  Lab 11/28/17 0137 11/28/17 1900  APTT  --  42*  INR 1.30 1.40   Sepsis Markers Recent Labs  Lab 11/29/17 1045 11/29/17 1313 11/30/17 0218  LATICACIDVEN 2.5* 1.8 0.8   ABG Recent Labs  Lab 11/28/17 1915 11/29/17 0503 11/30/17 0205  PHART 7.372 7.326* 7.440  PCO2ART 41.7 36.4 25.1*  PO2ART 230* 119.0* 89.0   Liver Enzymes Recent Labs  Lab 11/29/17 0425  AST 37  ALT 12*  ALKPHOS 94  BILITOT 0.5  ALBUMIN 1.9*   Cardiac Enzymes Recent Labs  Lab 11/30/17 0219 12/02/17 0330  TROPONINI <0.03 0.14*   Glucose Recent Labs  Lab 11/30/17 2205 12/01/17 0814 12/01/17 1219 12/01/17 1606 12/01/17 2120 12/02/17 0736  GLUCAP 109* 151* 121* 129* 137* 101*   Imaging No results found. STUDIES:  CTA AO+BIFEM 1/09 > 1. Interval occlusion of  the left external iliac and common femoral arteries as well as the femoral to popliteal bypass graft. This is a new finding compared to 08/11/2017. 2. Significant disease in the left common iliac artery may be slightly progressed compared to prior. 3. Additional severe right lower extremity peripheral arterial disease as detailed above without significant change. 4. Advanced aortic atherosclerosis with approximately 50% narrowing of the distal aorta. CXR 1/11 > The heart size and mediastinal contours are within normal limits. Right central venous line/vascular sheath is identified with distal tip in the superior vena cava. Nasogastric tube is identified with distal tip not included on film.  Minimal atelectasis of right lung base is noted. Both lungs are otherwise clear. The visualized skeletal structures are unremarkable. KUB 1/11 > The bowel gas pattern is normal. Nasogastric tube is identified with distal tip in the proximal stomach. Prior cholecystectomy clips are noted. Extensive bowel content is identified throughout colon. Vascular stents identified in the right iliac vessel. No radio-opaque calculi or other significant radiographic abnormality are seen. CT ABD/PELVIS 1/13 > Postsurgical changes of aortobifemoral bypass graft. There is small amount of pneumoperitoneum and diffuse mesenteric and abdominal wall edema. No hematoma. Bibasilar consolidative changes may represent atelectasis versus pneumonia  CULTURES: Blood 1/13 >>NTD   ANTIBIOTICS: None.   SIGNIFICANT EVENTS: 1/7 > Presents to [facility]  1/13 > PCCM consulted for hypotension 1/14 > atrial fib with RVR overnight, started on amiodarone with conversion to NSR   LINES/TUBES: RIJ CVC 1/11 >> Right Radial Aline 1/11 >>  Bilateral Groin Wound Vac 1/11 >> Foley 1/12 NG >> 1/12 > 1/14  DISCUSSION: 52 year old female presents with left lower extremity pain with concern for occlusion of left femoral-popliteal bypass graft. On 1/11 underwent aortobifemoral bypass grafting as well as revision of the graft on the right and embolectomy of the graft on the left.   ASSESSMENT / PLAN: PULMONARY Hypoxic Respiratory Failure - on nasal cannula saturating well.  Wean nasal cannula to SpO2 >88% - Incentive spirometry  CARDIOVASCULAR Hypotension Atrial Fibrillation PAD s/p femoral-popliteal bypass grafts, thrombectomy, revision, revascularization, and placement of bilateral femoral Provena wound vac Elevated troponin likely type II NSTEMI in setting of hypotension, atrial fibrillation with RVR - BP is in the 140s on 51mcg of Neo this morning.  Low BP required increasing this overnight but better now.  Titrate  down as able to keep MAP >65 - Check EKG - Delta troponin to ensure trending down.  She has a number of ischemic risk factors - Heparin gtt - Amiodarone gtt - Cortisol level 76, steroids discontinued - Post-op pain control per VVS  RENAL   No acute issues.  UOP adequate per nursing - Follow UOP, BMET - I/O  GASTROINTESTINAL Tolerating clear liquid diet - Advance as tolerated - PPI - Zofran PRN  HEMATOLOGIC Anemia s/p procedure stable  - Follow CBC  INFECTIOUS Leukocytosis, afebrile, cultures no growth to date - WBC continues to increase.  Now 27,000.  Has remained afebrile and her cultures are no growth to date.  No obvious infectious symptoms.  VVS may inspect groin incisions per PA.  She has history of chronic wounds as potential nidus of infection that were evaluated by wound care on 1/8 with recommendations left in the chart.  Not currently on antibiotics - Repeat CXR given newer oxygen requirements - Follow WBC, fever curve  ENDOCRINE  Type 2 DM - Sliding scale insulin with currently acceptable CBGs  NEUROLOGIC No Acute issues - RASS goal 0  DVT PPx Heparin gtt  CODE: Full  FAMILY  - Updates: No family at bedside  - Inter-disciplinary family meet or Palliative Care meeting due by: 12/07/2017  Jule Ser, PGY3 12/02/2017, 9:30 AM

## 2017-12-02 NOTE — Progress Notes (Signed)
CRITICAL VALUE ALERT  Critical Value:  Troponin= 0.14  Date & Time Notied:  12/02/17 0515  Provider Notified: MD Boone Master  No new orders; will continue to monitor

## 2017-12-03 DIAGNOSIS — D72829 Elevated white blood cell count, unspecified: Secondary | ICD-10-CM

## 2017-12-03 LAB — GLUCOSE, CAPILLARY
GLUCOSE-CAPILLARY: 159 mg/dL — AB (ref 65–99)
Glucose-Capillary: 79 mg/dL (ref 65–99)
Glucose-Capillary: 167 mg/dL — ABNORMAL HIGH (ref 65–99)
Glucose-Capillary: 139 mg/dL — ABNORMAL HIGH (ref 65–99)

## 2017-12-03 LAB — HEPATIC FUNCTION PANEL
ALK PHOS: 128 U/L — AB (ref 38–126)
ALT: 13 U/L — ABNORMAL LOW (ref 14–54)
AST: 19 U/L (ref 15–41)
Albumin: 1.5 g/dL — ABNORMAL LOW (ref 3.5–5.0)
TOTAL PROTEIN: 4.3 g/dL — AB (ref 6.5–8.1)
Total Bilirubin: 0.7 mg/dL (ref 0.3–1.2)

## 2017-12-03 LAB — PROCALCITONIN: PROCALCITONIN: 0.22 ng/mL

## 2017-12-03 LAB — CBC
HCT: 29.8 % — ABNORMAL LOW (ref 36.0–46.0)
Hemoglobin: 9.5 g/dL — ABNORMAL LOW (ref 12.0–15.0)
MCH: 27.6 pg (ref 26.0–34.0)
MCHC: 31.9 g/dL (ref 30.0–36.0)
MCV: 86.6 fL (ref 78.0–100.0)
PLATELETS: 347 10*3/uL (ref 150–400)
RBC: 3.44 MIL/uL — ABNORMAL LOW (ref 3.87–5.11)
RDW: 15.4 % (ref 11.5–15.5)
WBC: 17.7 10*3/uL — AB (ref 4.0–10.5)

## 2017-12-03 LAB — BASIC METABOLIC PANEL
Anion gap: 8 (ref 5–15)
BUN: 10 mg/dL (ref 6–20)
CO2: 20 mmol/L — ABNORMAL LOW (ref 22–32)
CREATININE: 0.66 mg/dL (ref 0.44–1.00)
Calcium: 8.1 mg/dL — ABNORMAL LOW (ref 8.9–10.3)
Chloride: 107 mmol/L (ref 101–111)
GFR calc Af Amer: >60 mL/min (ref >60)
GLUCOSE: 98 mg/dL (ref 65–99)
Potassium: 3.8 mmol/L (ref 3.5–5.1)
SODIUM: 135 mmol/L (ref 135–145)

## 2017-12-03 LAB — TSH: TSH: 2.5 u[IU]/mL (ref 0.350–4.500)

## 2017-12-03 LAB — HEPARIN LEVEL (UNFRACTIONATED): Heparin Unfractionated: 0.4 IU/mL (ref 0.30–0.70)

## 2017-12-03 MED ORDER — DICYCLOMINE HCL 10 MG PO CAPS
10.0000 mg | ORAL_CAPSULE | Freq: Four times a day (QID) | ORAL | Status: DC | PRN
  Filled 2017-12-03: qty 1

## 2017-12-03 MED ORDER — SODIUM CHLORIDE 0.9% FLUSH
10.0000 mL | INTRAVENOUS | Status: DC | PRN
  Administered 2017-12-06: 10 mL
  Administered 2017-12-08: 20 mL
  Filled 2017-12-03 (×2): qty 40

## 2017-12-03 MED ORDER — SODIUM CHLORIDE 0.9% FLUSH
10.0000 mL | Freq: Two times a day (BID) | INTRAVENOUS | Status: DC
  Administered 2017-12-04: 10 mL
  Administered 2017-12-06 – 2017-12-07 (×2): 40 mL

## 2017-12-03 MED ORDER — OXYCODONE-ACETAMINOPHEN 5-325 MG PO TABS
1.0000 | ORAL_TABLET | ORAL | Status: DC | PRN
  Administered 2017-12-03: 1 via ORAL
  Administered 2017-12-04 (×2): 2 via ORAL
  Administered 2017-12-04: 1 via ORAL
  Administered 2017-12-04 – 2017-12-09 (×19): 2 via ORAL
  Filled 2017-12-03 (×5): qty 2
  Filled 2017-12-03: qty 1
  Filled 2017-12-03 (×17): qty 2

## 2017-12-03 MED ORDER — CHLORHEXIDINE GLUCONATE CLOTH 2 % EX PADS
6.0000 | MEDICATED_PAD | Freq: Every day | CUTANEOUS | Status: DC
  Administered 2017-12-09: 6 via TOPICAL

## 2017-12-03 MED ORDER — PREGABALIN 100 MG PO CAPS
100.0000 mg | ORAL_CAPSULE | Freq: Three times a day (TID) | ORAL | Status: DC
  Administered 2017-12-03 – 2017-12-09 (×19): 100 mg via ORAL
  Filled 2017-12-03 (×17): qty 1
  Filled 2017-12-03: qty 2
  Filled 2017-12-03: qty 1

## 2017-12-03 MED ORDER — PANTOPRAZOLE SODIUM 40 MG PO TBEC
40.0000 mg | DELAYED_RELEASE_TABLET | Freq: Every day | ORAL | Status: DC
  Administered 2017-12-04 – 2017-12-09 (×6): 40 mg via ORAL
  Filled 2017-12-03 (×6): qty 1

## 2017-12-03 MED ORDER — POTASSIUM CHLORIDE 20 MEQ PO PACK
40.0000 meq | PACK | Freq: Once | ORAL | Status: AC
  Administered 2017-12-03: 40 meq via ORAL
  Filled 2017-12-03: qty 2

## 2017-12-03 MED ORDER — ALPRAZOLAM 0.5 MG PO TABS
0.5000 mg | ORAL_TABLET | Freq: Once | ORAL | Status: DC
  Filled 2017-12-03: qty 1

## 2017-12-03 MED ORDER — ONDANSETRON 4 MG PO TBDP
4.0000 mg | ORAL_TABLET | Freq: Three times a day (TID) | ORAL | Status: DC | PRN
  Filled 2017-12-03: qty 1

## 2017-12-03 MED FILL — Sodium Chloride IV Soln 0.9%: INTRAVENOUS | Qty: 1000 | Status: AC

## 2017-12-03 MED FILL — Heparin Sodium (Porcine) Inj 1000 Unit/ML: INTRAMUSCULAR | Qty: 30 | Status: AC

## 2017-12-03 NOTE — Progress Notes (Signed)
Progress Note  Patient Name: [name] Date of Encounter: 12/03/2017  Primary Cardiologist: Nelva Bush, MD   Subjective   Breathing is fair  No CP   Abdomen sore  Inpatient Medications    Scheduled Meds: . Chlorhexidine Gluconate Cloth  6 each Topical Daily  . docusate sodium  100 mg Oral Daily  . insulin aspart  0-9 Units Subcutaneous TID WC  . mouth rinse  15 mL Mouth Rinse BID  . [START ON 12/04/2017] pantoprazole  40 mg Oral Daily  . sodium chloride flush  10-40 mL Intracatheter Q12H   Continuous Infusions: . amiodarone 30 mg/hr (12/03/17 0700)  . heparin 1,750 Units/hr (12/03/17 0953)  . phenylephrine (NEO-SYNEPHRINE) Adult infusion Stopped (12/03/17 0810)   PRN Meds: acetaminophen **OR** acetaminophen, alum & mag hydroxide-simeth, bisacodyl, guaiFENesin-dextromethorphan, HYDROmorphone (DILAUDID) injection, ondansetron, oxyCODONE-acetaminophen, phenol, sodium chloride flush   Vital Signs    Vitals:   12/03/17 1100 12/03/17 1200 12/03/17 1230 12/03/17 1256  BP: (!) 89/56 (!) 83/45 (!) 82/49   Pulse: 73 70 67   Resp: 12 13 11    Temp:    97.7 F (36.5 C)  TempSrc:    Oral  SpO2: 99% 95% 94%   Weight:      Height:        Intake/Output Summary (Last 24 hours) at 12/03/2017 1507 Last data filed at 12/03/2017 0700 Gross per 24 hour  Intake 1678.7 ml  Output 400 ml  Net 1278.7 ml   Filed Weights   11/27/17 0444 11/28/17 0354 12/02/17 0500  Weight: 157 lb 3.2 oz (71.3 kg) 166 lb 1.6 oz (75.3 kg) 193 lb 9 oz (87.8 kg)    Telemetry    SR w/ frequent PVCs and trigeminy.  NOw  Atrial fib, RVR w/ PVCs seen am of 01/13 thru am of 01/14 - Personally Reviewed  ECG    01/16, SR  PVCs - Personally Reviewed  Physical Exam   GEN: WD, female, No acute distress.   Neck: No JVD seen  Cardiac:  RRR  No signif mrumurs Respiratory: Rhonchi GI: Soft, nontender, non-distended  MS: 1+ LE edema; t Neuro:  Nonfocal  Psych: Normal affect   Labs     Chemistry Recent Labs  Lab 11/29/17 0425  12/01/17 0127 12/02/17 0330 12/03/17 0350  NA 134*   < > 137 136 135  K 4.0   < > 4.3 3.7 3.8  CL 106   < > 108 107 107  CO2 19*   < > 20* 22 20*  GLUCOSE 160*   < > 113* 127* 98  BUN 14   < > 9 14 10   CREATININE 0.85   < > 0.78 0.66 0.66  CALCIUM 7.5*   < > 8.0* 8.0* 8.1*  PROT 4.2*  --   --   --  4.3*  ALBUMIN 1.9*  --   --   --  1.5*  AST 37  --   --   --  19  ALT 12*  --   --   --  13*  ALKPHOS 94  --   --   --  128*  BILITOT 0.5  --   --   --  0.7  GFRNONAA >60   < > >60 >60 >60  GFRAA >60   < > >60 >60 >60  ANIONGAP 9   < > 9 7 8    < > = values in this interval not displayed.     Hematology Recent  Labs  Lab 12/01/17 0127 12/02/17 0330 12/03/17 0350  WBC 24.5* 27.9* 17.7*  RBC 3.53* 3.28* 3.44*  HGB 10.0* 9.5* 9.5*  HCT 30.2* 28.2* 29.8*  MCV 85.6 86.0 86.6  MCH 28.3 29.0 27.6  MCHC 33.1 33.7 31.9  RDW 14.8 15.2 15.4  PLT 268 336 347    Cardiac Enzymes Recent Labs  Lab 11/30/17 0219 12/02/17 0330 12/02/17 0930  TROPONINI <0.03 0.14* <0.03      BNPNo results for input(s): BNP, PROBNP in the last 168 hours.    Radiology    Dg Chest Port 1 View  Result Date: 12/02/2017 CLINICAL DATA:  52 year old female with shortness of breath. EXAM: PORTABLE CHEST 1 VIEW COMPARISON:  Chest x-ray 11/30/2017. FINDINGS: Previously noted nasogastric tube has been removed. Right internal jugular Vas-Cath in position with tip terminating in the mid superior vena cava. Lung volumes are low. There is cephalization of the pulmonary vasculature and slight indistinctness of the interstitial markings suggestive of mild pulmonary edema. Probable small left pleural effusion. Heart size appears upper limits of normal. The patient is rotated to the left on today's exam, resulting in distortion of the mediastinal contours and reduced diagnostic sensitivity and specificity for mediastinal pathology. IMPRESSION: 1. The appearance the chest  suggests a background of mild interstitial pulmonary edema. 2. Small left pleural effusion. Electronically Signed   By: Vinnie Langton M.D.   On: 12/02/2017 10:41    Cardiac Studies   Myoview 03/12/2017 Pharmacological myocardial perfusion imaging study with no significant Ischemia Large region of fixed perfusion defect in the basal to mid inferior wall, inferior lateral mid to distal wall concerning for previous MI  Hypokinesis noted in the basal to mid inferior wall, EF estimated at 43% No EKG changes concerning for ischemia at peak stress or in recovery. Moderate to high risk and given size of perfusion defect detailed above  Echo:  04/17/2017   - Technically difficult study due to chest wall and/or lung   interference. - Left ventricle: The cavity size was normal. Wall thickness was   increased in a pattern of mild LVH. Systolic function was normal.   The estimated ejection fraction was in the range of 55% to 60%.   The study is not technically sufficient to allow evaluation of LV   diastolic function. - Mitral valve: Calcified annulus. Mildly thickened leaflets . - Right ventricle: The cavity size was normal. Systolic function   was normal. - Pericardium, extracardiac: A trivial pericardial effusion was   identified.  Patient Profile     52 y.o. 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 Cards saw preop, no further eval. 01/15, cards asked to reassess for atrial fib, RVR.   Assessment & Plan      1. Paroxysmal atrial fibrillation with rapid ventricular response ([place])- CHA2DS2VASc= 4 (female, HTN, DM, PAD)  Pt Now in SR with PVCs  On IV amiodarone .Would continue   2.  Femoral-popliteal bypass graft occlusion, left (HCC) w/ R Iliac occlusion      - s/p aortobifemoral bypass grafting as well as revision of the graft on the right and embolectomy of the graft on the left  - per VVS and CCM    For questions or updates, please contact [place] Please consult  [email or web address] for contact info under Cardiology/STEMI.      Signed, Dorris Carnes, MD  12/03/2017, 3:06 PM

## 2017-12-03 NOTE — Progress Notes (Signed)
    Subjective  - POD #5  Voiding on her own Still with flatus Nausea better   Physical Exam:  abd soft Incisions all clean Brisk pedal doppler signals Left foot with stable patches of ischemia   Assessment/Plan:  POD #5  GI:  Advance diet as tolerated ID:  No issues Renal:  Creatinine stable.  Adequate UOP Cardiac:  Upper arm BP's are not accurate due to subclavian occlusive dieases bilaterally.  D/C Neo drip.  COntinue heparin for AFIB Vascular:  stable Prophylaxis:  Full dose heparin, protonix Diabetes:  Insulin drip Acute post operative blood loss anemia:  Stable Hypovolemic shock: resolved NEEDS TO GET OOB AND AMBULATE.  PT consult Likely transfer to floor later this am  Wells Brabham 12/03/2017 8:13 AM --  Vitals:   12/03/17 0700 12/03/17 0715  BP: 103/75 97/71  Pulse: 76 73  Resp: 11 12  Temp:    SpO2: 96% 96%    Intake/Output Summary (Last 24 hours) at 12/03/2017 0813 Last data filed at 12/03/2017 0700 Gross per 24 hour  Intake 2541.4 ml  Output 515 ml  Net 2026.4 ml     Laboratory CBC    Component Value Date/Time   WBC 17.7 (H) 12/03/2017 0350   HGB 9.5 (L) 12/03/2017 0350   HCT 29.8 (L) 12/03/2017 0350   PLT 347 12/03/2017 0350    BMET    Component Value Date/Time   NA 135 12/03/2017 0350   K 3.8 12/03/2017 0350   CL 107 12/03/2017 0350   CO2 20 (L) 12/03/2017 0350   GLUCOSE 98 12/03/2017 0350   BUN 10 12/03/2017 0350   CREATININE 0.66 12/03/2017 0350   CREATININE 1.24 (H) 08/12/2017 1530   CALCIUM 8.1 (L) 12/03/2017 0350   GFRNONAA >60 12/03/2017 0350   GFRNONAA 50 (L) 08/12/2017 1530   GFRAA >60 12/03/2017 0350   GFRAA 57 (L) 08/12/2017 1530    COAG Lab Results  Component Value Date   INR 1.40 11/28/2017   INR 1.30 11/28/2017   INR 1.18 11/24/2017   No results found for: PTT  Antibiotics Anti-infectives (From admission, onward)   Start     Dose/Rate Route Frequency Ordered Stop   11/28/17 2200  cefUROXime (ZINACEF)  1.5 g in dextrose 5 % 50 mL IVPB     1.5 g 100 mL/hr over 30 Minutes Intravenous Every 12 hours 11/28/17 2003 11/29/17 2159   11/27/17 0830  ceFAZolin (ANCEF) IVPB 2g/100 mL premix  Status:  Discontinued     2 g 200 mL/hr over 30 Minutes Intravenous To Short Stay 11/27/17 0813 11/28/17 0830   11/25/17 1400  azithromycin (ZITHROMAX) tablet 500 mg  Status:  Discontinued    Comments:  Please dose appropriately for sinus infection   500 mg Oral Daily 11/25/17 1257 11/28/17 2003   11/24/17 2200  doxycycline (VIBRA-TABS) tablet 100 mg  Status:  Discontinued     100 mg Oral 2 times daily 11/24/17 1807 11/28/17 2003       V. Leia Alf, M.D. [facility] Office: [phone] Pager:  [phone]

## 2017-12-03 NOTE — Clinical Social Work Note (Signed)
Clinical Social Work Assessment  Patient Details  Name: [name] MRN: [id number] Date of Birth: 09/22/1966  Date of referral:  12/03/17               Reason for consult:  Facility Placement                Permission sought to share information with:  Facility Sport and exercise psychologist, Family Supports Permission granted to share information::  Yes, Verbal Permission Granted  Name::     Public librarian::  SNF  Relationship::  spouse  Contact Information:     Housing/Transportation Living arrangements for the past 2 months:  Single Family Home Source of Information:  Patient Patient Interpreter Needed:  None Criminal Activity/Legal Involvement Pertinent to Current Situation/Hospitalization:  No - Comment as needed Significant Relationships:  Spouse, Pets Lives with:  Pets, Spouse, Other (Comment)(father in law) Do you feel safe going back to the place where you live?  Yes Need for family participation in patient care:  No (Coment)  Care giving concerns:  Pt lives at home with spouse and her father in law states they would be able to assist with mobility if she needed additional assistance upon returning home.  Patient reports she is normally independent with assistive devices but has been impaired since her 30s due to back issues.   Social Worker assessment / plan:  CSW spoke with pt concerning PT recommendations for SNF.  CSW explained SNF and SNF referral process.  Employment status:  Disabled (Comment on whether or not currently receiving Disability) Insurance information:  Managed Medicare PT Recommendations:  [place] / Referral to community resources:  [place]  Patient/Family's Response to care:  Pt initially very against SNF placement- states she needs to return to her dog who she misses very much.  CSW provided support regarding homesickness but discussed patient currently mobility and weakness in detail with patient-  patient then agreeable to SNF referral being sent out as a back up plan but that her 1st choice would be to return home to family and her pet.  Patient/Family's Understanding of and Emotional Response to Diagnosis, Current Treatment, and Prognosis:  Pt seemed to have a good understanding of her condition and is hopeful that she will start to get better.   Emotional Assessment Appearance:  Appears older than stated age Attitude/Demeanor/Rapport:    Affect (typically observed):  Appropriate, Tearful/Crying (when discussing her mother who passed) Orientation:  Oriented to Self, Oriented to Place, Oriented to  Time, Oriented to Situation Alcohol / Substance use:  Not Applicable Psych involvement (Current and /or in the community):  No (Comment)  Discharge Needs  Concerns to be addressed:  Care Coordination Readmission within the last 30 days:  No Current discharge risk:  Physical Impairment Barriers to Discharge:  Continued Medical Work up   Jorge Ny, LCSW 12/03/2017, 9:59 AM

## 2017-12-03 NOTE — Progress Notes (Signed)
    Patient is no longer on pressors. Will transfer to 4E when bed available.  Patient has been a hard stick for labs and IV team was called.  Plan PICC line placement for remainder of lab and IV access.  Order placed.  [name] PA-C

## 2017-12-03 NOTE — Progress Notes (Signed)
PULMONARY / CRITICAL CARE MEDICINE   Name: [name] MRN: [id number] DOB: 1965/12/21    ADMISSION DATE:  11/24/2017 CONSULTATION DATE:  11/30/2017  REFERRING MD:  Dr. Donzetta Matters    CHIEF COMPLAINT:  Hypotension   HISTORY OF PRESENT ILLNESS:   52 year old female with PMH of DM, GERD, HLD, PAD, CAD  Presents 1/7 for left lower extremity pain with concern for occlusion of left femoral-popliteal bypass graft. 1/11 underwent exploration of left fem-pop bypass graft, embolectomy, and revision. Upon return to the unit patient was having pins and needle feeling of the left foot and purple discoloration with minimal cap refill. Went back to OR for revascularization of bilateral femoral to popliteal arteries, application of femoral wound vac x2. 1/13 patient with progressive hypotension, PCCM asked to consult.   SUBJECTIVE:  Doing better this morning and well.  Agreeable to trying to get out of bed.  VITAL SIGNS: BP (!) 89/56 (BP Location: Right Arm)   Pulse 77   Temp 97.9 F (36.6 C) (Oral)   Resp 11   Ht 5\' 7"  (1.702 m)   Wt 193 lb 9 oz (87.8 kg)   SpO2 98%   BMI 30.32 kg/m   HEMODYNAMICS:    VENTILATOR SETTINGS:    INTAKE / OUTPUT: I/O last 3 completed shifts: In: 4670.1 [P.O.:710; I.V.:3960.1] Out: 765 [Urine:765]  PHYSICAL EXAMINATION: General:  Adult female, resting in bed, NAD Neuro:  Alert and interactive, follows commands HEENT:  [place]/AT, EOMI Neck: + right IJ Cardiovascular:  RRR, no m/r/g Lungs:  Diminished at bases otherwise CTAB Abdomen:  Soft, non distended, +BS Skin:  Left LE post surgical with staples intact.  There is discoloration and blistering of left great toe and lateral foot that are stable  LABS:  BMET Recent Labs  Lab 12/01/17 0127 12/02/17 0330 12/03/17 0350  NA 137 136 135  K 4.3 3.7 3.8  CL 108 107 107  CO2 20* 22 20*  BUN 9 14 10   CREATININE 0.78 0.66 0.66  GLUCOSE 113* 127* 98   Electrolytes Recent Labs  Lab 11/29/17 0425  11/30/17 0206 12/01/17 0127 12/02/17 0330 12/03/17 0350  CALCIUM 7.5* 7.3* 8.0* 8.0* 8.1*  MG 1.1* 2.1 2.0  --   --   PHOS  --   --  3.6  --   --    CBC Recent Labs  Lab 12/01/17 0127 12/02/17 0330 12/03/17 0350  WBC 24.5* 27.9* 17.7*  HGB 10.0* 9.5* 9.5*  HCT 30.2* 28.2* 29.8*  PLT 268 336 347   Coag's Recent Labs  Lab 11/28/17 0137 11/28/17 1900  APTT  --  42*  INR 1.30 1.40   Sepsis Markers Recent Labs  Lab 11/29/17 1045 11/29/17 1313 11/30/17 0218 12/02/17 1134 12/03/17 0350  LATICACIDVEN 2.5* 1.8 0.8  --   --   PROCALCITON  --   --   --  0.37 0.22   ABG Recent Labs  Lab 11/28/17 1915 11/29/17 0503 11/30/17 0205  PHART 7.372 7.326* 7.440  PCO2ART 41.7 36.4 25.1*  PO2ART 230* 119.0* 89.0   Liver Enzymes Recent Labs  Lab 11/29/17 0425 12/03/17 0350  AST 37 19  ALT 12* 13*  ALKPHOS 94 128*  BILITOT 0.5 0.7  ALBUMIN 1.9* 1.5*   Cardiac Enzymes Recent Labs  Lab 11/30/17 0219 12/02/17 0330 12/02/17 0930  TROPONINI <0.03 0.14* <0.03   Glucose Recent Labs  Lab 12/02/17 0736 12/02/17 1130 12/02/17 1525 12/02/17 1742 12/02/17 2125 12/03/17 0721  GLUCAP 101* 104*  85 101* 96 79   Imaging Dg Chest Port 1 View  Result Date: 12/02/2017 CLINICAL DATA:  52 year old female with shortness of breath. EXAM: PORTABLE CHEST 1 VIEW COMPARISON:  Chest x-ray 11/30/2017. FINDINGS: Previously noted nasogastric tube has been removed. Right internal jugular Vas-Cath in position with tip terminating in the mid superior vena cava. Lung volumes are low. There is cephalization of the pulmonary vasculature and slight indistinctness of the interstitial markings suggestive of mild pulmonary edema. Probable small left pleural effusion. Heart size appears upper limits of normal. The patient is rotated to the left on today's exam, resulting in distortion of the mediastinal contours and reduced diagnostic sensitivity and specificity for mediastinal pathology. IMPRESSION:  1. The appearance the chest suggests a background of mild interstitial pulmonary edema. 2. Small left pleural effusion. Electronically Signed   By: Vinnie Langton M.D.   On: 12/02/2017 10:41   STUDIES:  CTA AO+BIFEM 1/09 > 1. Interval occlusion of the left external iliac and common femoral arteries as well as the femoral to popliteal bypass graft. This is a new finding compared to 08/11/2017. 2. Significant disease in the left common iliac artery may be slightly progressed compared to prior. 3. Additional severe right lower extremity peripheral arterial disease as detailed above without significant change. 4. Advanced aortic atherosclerosis with approximately 50% narrowing of the distal aorta. CXR 1/11 > The heart size and mediastinal contours are within normal limits. Right central venous line/vascular sheath is identified with distal tip in the superior vena cava. Nasogastric tube is identified with distal tip not included on film. Minimal atelectasis of right lung base is noted. Both lungs are otherwise clear. The visualized skeletal structures are unremarkable. KUB 1/11 > The bowel gas pattern is normal. Nasogastric tube is identified with distal tip in the proximal stomach. Prior cholecystectomy clips are noted. Extensive bowel content is identified throughout colon. Vascular stents identified in the right iliac vessel. No radio-opaque calculi or other significant radiographic abnormality are seen. CT ABD/PELVIS 1/13 > Postsurgical changes of aortobifemoral bypass graft. There is small amount of pneumoperitoneum and diffuse mesenteric and abdominal wall edema. No hematoma. Bibasilar consolidative changes may represent atelectasis versus pneumonia  CULTURES: Blood 1/13 >>NTD   ANTIBIOTICS: None.   SIGNIFICANT EVENTS: 1/7 > Presents to [facility]  1/13 > PCCM consulted for hypotension 1/14 > atrial fib with RVR overnight, started on amiodarone with conversion to  NSR   LINES/TUBES: RIJ CVC 1/11 >> Bilateral Groin Wound Vac 1/11 >> Foley 1/12 NG >> 1/12 > 1/14  DISCUSSION: 52 year old female presents with left lower extremity pain with concern for occlusion of left femoral-popliteal bypass graft. On 1/11 underwent aortobifemoral bypass grafting as well as revision of the graft on the right and embolectomy of the graft on the left.   ASSESSMENT / PLAN: PULMONARY Hypoxic Respiratory Failure - on nasal cannula saturating well.  Wean nasal cannula to SpO2 >88% - Incentive spirometry  CARDIOVASCULAR Hypotension Atrial Fibrillation PAD s/p femoral-popliteal bypass grafts, thrombectomy, revision, revascularization, and placement of bilateral femoral Provena wound vac Elevated troponin likely type II NSTEMI in setting of hypotension, atrial fibrillation with RVR - BP stable this morning off Neo since 7am - Delta troponin was negative - Heparin gtt for anticoagulation.  Will need to change to PO when ready.  Her CHADSVASC is 4. - Amiodarone gtt for now per cardiology.  TSH, LFTs ok - Post-op pain control per VVS - Needs intensive rehab/PT due to deconditioning.  PT eval noted  recommending SNF placement  RENAL   No acute issues.  UOP adequate per nursing K+ is 3.8, will replace to keep above 4 - Follow UOP, BMET - I/O  GASTROINTESTINAL Tolerating full liquid diet - Advance as tolerated - PPI - Zofran PRN  HEMATOLOGIC Anemia s/p procedure stable  - Follow CBC  INFECTIOUS Leukocytosis, afebrile, cultures no growth to date - WBC down to 17,000 off antibioics.  PCT is unremarkable.  Remains afebrile.   - Blood cx no growth - Follow WBC, fever curve  ENDOCRINE  Type 2 DM - Sliding scale insulin with currently acceptable CBGs  NEUROLOGIC No Acute issues - RASS goal 0  DVT PPx Heparin gtt  CODE: Full  FAMILY  - Updates: No family at bedside  - Inter-disciplinary family meet or Palliative Care meeting due by:  12/07/2017  Jule Ser, PGY3 12/03/2017, 10:08 AM

## 2017-12-03 NOTE — Progress Notes (Signed)
Peripherally Inserted Central Catheter/Midline Placement  The IV Nurse has discussed with the patient and/or persons authorized to consent for the patient, the purpose of this procedure and the potential benefits and risks involved with this procedure.  The benefits include less needle sticks, lab draws from the catheter, and the patient may be discharged home with the catheter. Risks include, but not limited to, infection, bleeding, blood clot (thrombus formation), and puncture of an artery; nerve damage and irregular heartbeat and possibility to perform a PICC exchange if needed/ordered by physician.  Alternatives to this procedure were also discussed.  Bard Power PICC patient education guide, fact sheet on infection prevention and patient information card has been provided to patient /or left at bedside.    PICC/Midline Placement Documentation  PICC Double Lumen 07/61/51 PICC Right Basilic 47 cm 0 cm (Active)  Indication for Insertion or Continuance of Line Vasoactive infusions 12/03/2017  9:00 PM  Exposed Catheter (cm) 0 cm 12/03/2017  9:00 PM  Site Assessment Clean;Dry;Intact 12/03/2017  9:00 PM  Lumen #1 Status Flushed;Saline locked;Blood return noted 12/03/2017  9:00 PM  Lumen #2 Status Flushed;Saline locked;Blood return noted 12/03/2017  9:00 PM  Dressing Type Transparent 12/03/2017  9:00 PM  Dressing Status Clean;Dry;Intact;Antimicrobial disc in place 12/03/2017  9:00 PM  Line Care Connections checked and tightened 12/03/2017  9:00 PM  Dressing Intervention New dressing 12/03/2017  9:00 PM  Dressing Change Due 12/10/17 12/03/2017  9:00 PM       [name] 12/03/2017, 10:02 PM

## 2017-12-03 NOTE — NC FL2 (Signed)
[place] LEVEL OF CARE SCREENING TOOL     IDENTIFICATION  Patient Name: [name] Birthdate: 05/03/1966 Sex: female Admission Date (Current Location): 11/24/2017  [facility] and [place] Number:  [employer] and Address:  [facility], [place] [address] [place], [place] [address]      Provider Number: [id number]  Attending Physician Name and Address:  Serafina Mitchell, MD  Relative Name and Phone Number:       Current Level of Care: Hospital Recommended Level of Care: [place] Prior Approval Number:    Date Approved/Denied:   PASRR Number: [id number]  Discharge Plan: SNF    Current Diagnoses: Patient Active Problem List   Diagnosis Date Noted  . Femoral-popliteal bypass graft occlusion, left ([place]) 12/02/2017  . Respiratory failure with hypoxia ([place]) 12/02/2017  . Leukocytosis 12/02/2017  . Paroxysmal atrial fibrillation with rapid ventricular response ([place]) 12/02/2017  . Thrombosis 11/24/2017  . Hyperkalemia 06/26/2017  . Atherosclerosis of native artery of right lower extremity with ulceration of ankle ([place]) 05/13/2017  . Osteomyelitis of right fibula ([place]) 03/05/2017  . 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 Elevated lactic acid level   . Osteomyelitis of ankle ([place]) 12/29/2016  . Chronic pain 12/24/2016  . Buttock wound, left, subsequent encounter   . Hypotension 12/23/2016  . IDDM (insulin dependent diabetes mellitus) ([place]) 12/23/2016  . AKI (acute kidney injury) ([place]) 12/23/2016  . Malnutrition ([place]) 12/23/2016  . Thrush 12/23/2016  . Sepsis ([place]) 11/27/2016  . Shock ([place]) 11/27/2016  . DM2 (diabetes mellitus, type 2) ([place]) 11/27/2016  . Chronic back pain 11/27/2016  . Orthostatic hypotension 11/27/2016  . Demand ischemia ([place])   . Other hyperlipidemia   . Right sided weakness   . Retinal tumor of right eye   . Pressure injury of skin  10/01/2016  . Necrotizing fasciitis ([place]) 09/30/2016  . 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 Paresthesia of both hands 03/05/2016  . DDD (degenerative disc disease), lumbosacral 03/26/2015  . DDD (degenerative disc disease), cervical 03/26/2015  . Sacroiliac joint disease 03/26/2015  . Occipital neuralgia 03/26/2015  . Pain in limb-Bilateral Leg 04/25/2014  . PAD (peripheral artery disease) ([place]) 04/05/2013  . 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) 02/10/2012    Orientation RESPIRATION BLADDER Height & Weight     Self, Time, Situation, Place  O2(1L [place]) Incontinent, External catheter Weight: 193 lb 9 oz (87.8 kg) Height:  5\' 7"  (170.2 cm)  BEHAVIORAL SYMPTOMS/MOOD NEUROLOGICAL BOWEL NUTRITION STATUS      Continent Diet(see DC summary)  AMBULATORY STATUS COMMUNICATION OF NEEDS Skin   Extensive Assist Verbally Surgical wounds, Other (Comment), Wound Vac(non pressuer wounds on distal ankle and buttocks requiring daily dressing foam/silver hydrofiber dressing changes; prevena wound vacs placed 1/11 on both legs will likely be DC'd prior to leaving hospital)                       Personal Care Assistance Level of Assistance  Bathing, Dressing Bathing Assistance: Maximum assistance   Dressing Assistance: Maximum assistance     Functional Limitations Info             SPECIAL CARE FACTORS FREQUENCY  PT (By licensed PT), OT (By licensed OT)     PT Frequency: 5/wk OT Frequency: 5/wk            Contractures  Additional Factors Info  Code Status, Allergies, Insulin Sliding Scale Code Status Info: FULL Allergies Info: NKA   Insulin Sliding Scale Info: 3/day       Current Medications (12/03/2017):  This is the current hospital active medication list Current Facility-Administered Medications  Medication Dose Route Frequency Provider Last Rate Last Dose  . acetaminophen (TYLENOL) tablet 325-650 mg  325-650 mg Oral Q4H PRN  Ulyses Amor, PA-C   650 mg at 12/01/17 2992   Or  . acetaminophen (TYLENOL) suppository 325-650 mg  325-650 mg Rectal Q4H PRN Ulyses Amor, PA-C   650 mg at 11/30/17 2016  . alum & mag hydroxide-simeth (MAALOX/MYLANTA) 200-200-20 MG/5ML suspension 15-30 mL  15-30 mL Oral Q2H PRN Laurence Slate M, PA-C      . amiodarone (NEXTERONE PREMIX) 360-4.14 MG/200ML-% (1.8 mg/mL) IV infusion  30 mg/hr Intravenous Continuous Anders Simmonds, MD 16.7 mL/hr at 12/03/17 0700 30 mg/hr at 12/03/17 0700  . bisacodyl (DULCOLAX) suppository 10 mg  10 mg Rectal Daily PRN Ulyses Amor, PA-C      . Chlorhexidine Gluconate Cloth 2 % PADS 6 each  6 each Topical Daily [name], MD   6 each at 12/02/17 [phone]  . docusate sodium (COLACE) capsule 100 mg  100 mg Oral Daily Laurence Slate M, PA-C   100 mg at 12/02/17 3419  . guaiFENesin-dextromethorphan (ROBITUSSIN DM) 100-10 MG/5ML syrup 15 mL  15 mL Oral Q4H PRN Laurence Slate M, PA-C      . heparin ADULT infusion 100 units/mL (25000 units/214mL sodium chloride 0.45%)  1,750 Units/hr Intravenous Continuous Harvel Quale, RPH 17.5 mL/hr at 12/03/17 0700 1,750 Units/hr at 12/03/17 0700  . hydrALAZINE (APRESOLINE) injection 5 mg  5 mg Intravenous Q20 Min PRN Laurence Slate M, PA-C      . HYDROmorphone (DILAUDID) injection 0.5 mg  0.5 mg Intravenous Q2H PRN Serafina Mitchell, MD   0.5 mg at 12/03/17 0235  . insulin aspart (novoLOG) injection 0-9 Units  0-9 Units Subcutaneous TID WC [name], MD   1 Units at 12/01/17 1643  . labetalol (NORMODYNE,TRANDATE) injection 10 mg  10 mg Intravenous Q10 min PRN Laurence Slate M, PA-C      . MEDLINE mouth rinse  15 mL Mouth Rinse BID [name], MD   15 mL at 12/02/17 2154  . metoprolol tartrate (LOPRESSOR) injection 2-5 mg  2-5 mg Intravenous Q2H PRN Laurence Slate M, PA-C      . ondansetron [facility]) injection 4 mg  4 mg Intravenous Q6H PRN Laurence Slate M, PA-C   4 mg at 12/02/17 2230  .  oxyCODONE-acetaminophen (PERCOCET/ROXICET) 5-325 MG per tablet 1 tablet  1 tablet Oral Q4H PRN Rhyne, Hulen Shouts, PA-C   1 tablet at 12/03/17 0500  . pantoprazole (PROTONIX) injection 40 mg  40 mg Intravenous Daily Serafina Mitchell, MD   40 mg at 12/02/17 0917  . phenol (CHLORASEPTIC) mouth spray 1 spray  1 spray Mouth/Throat PRN Ulyses Amor, PA-C   1 spray at 11/30/17 1708  . phenylephrine (NEO-SYNEPHRINE) 10 mg in sodium chloride 0.9 % 250 mL (0.04 mg/mL) infusion  0-400 mcg/min Intravenous Titrated Anders Simmonds, MD 15 mL/hr at 12/03/17 0702 10 mcg/min at 12/03/17 0702  . sodium chloride flush (NS) 0.9 % injection 10-40 mL  10-40 mL Intracatheter Q12H [name], MD   10 mL at 12/02/17 2155  . sodium chloride flush (NS) 0.9 % injection 10-40 mL  10-40  mL Intracatheter PRN [name], MD         Discharge Medications: Please see discharge summary for a list of discharge medications.  Relevant Imaging Results:  Relevant Lab Results:   Additional Information SS#: [id number]  Jorge Ny, LCSW

## 2017-12-03 NOTE — Progress Notes (Signed)
PT Cancellation Note  Patient Details Name: [name] MRN: [id number] DOB: 28-Mar-1966   Cancelled Treatment:    Reason Eval/Treat Not Completed: Other (comment). Declining PT today, no specific reason given, despite max encouragement and education on importance of mobility. Will continue to follow acutely per plan of care.  Mabeline Caras, PT, DPT Acute Rehab Services  Pager: [phone] 12/03/2017, 3:42 PM

## 2017-12-03 NOTE — Progress Notes (Signed)
ANTICOAGULATION CONSULT NOTE - Follow Up Consult  Pharmacy Consult for Heparin Indication: occluded fem-pop bypass  No Known Allergies  Patient Measurements: Height: 5\' 7"  (170.2 cm) Weight: 193 lb 9 oz (87.8 kg) IBW/kg (Calculated) : 61.6 Heparin Dosing Weight: 73.5 kg  Vital Signs: Temp: 97.9 F (36.6 C) (01/16 0840) Temp Source: Oral (01/16 0840) BP: 89/56 (01/16 1000) Pulse Rate: 77 (01/16 1000)  Labs: Recent Labs    12/01/17 0127 12/01/17 0527 12/02/17 0330 12/02/17 0338 12/02/17 0930 12/03/17 0350  HGB 10.0*  --  9.5*  --   --  9.5*  HCT 30.2*  --  28.2*  --   --  29.8*  PLT 268  --  336  --   --  347  HEPARINUNFRC  --  0.36  --  0.37  --  0.40  CREATININE 0.78  --  0.66  --   --  0.66  TROPONINI  --   --  0.14*  --  <0.03  --     Estimated Creatinine Clearance: 92.6 mL/min (by C-G formula based on SCr of 0.66 mg/dL).   Medications:  Scheduled:  . Chlorhexidine Gluconate Cloth  6 each Topical Daily  . docusate sodium  100 mg Oral Daily  . insulin aspart  0-9 Units Subcutaneous TID WC  . mouth rinse  15 mL Mouth Rinse BID  . pantoprazole (PROTONIX) IV  40 mg Intravenous Daily  . sodium chloride flush  10-40 mL Intracatheter Q12H   Infusions:  . amiodarone 30 mg/hr (12/03/17 0700)  . heparin 1,750 Units/hr (12/03/17 0953)  . phenylephrine (NEO-SYNEPHRINE) Adult infusion Stopped (12/03/17 0810)    Assessment: 52 yo F who presented with LLE pain and presummed recurrent restenosis of LE bypass graft.  VVS reviewing for possible surgical intervention > went back to OR 1/11 for redo fem-pop. She was is afib but currently back in NSR. Per VVS- Continuing heparin due to appearance of foot.  -heparin level is at goal on 1750 units/hr  Goal of Therapy:  Heparin level 0.3-0.7 units/ml Monitor platelets by anticoagulation protocol: Yes    Plan:  -No heparin changes needed -Continue daily heparin level and CBC  Hildred Laser, Pharm D 12/03/2017 10:13  AM

## 2017-12-04 LAB — BASIC METABOLIC PANEL
Anion gap: 5 (ref 5–15)
BUN: 10 mg/dL (ref 6–20)
CALCIUM: 7.7 mg/dL — AB (ref 8.9–10.3)
CO2: 23 mmol/L (ref 22–32)
CREATININE: 0.59 mg/dL (ref 0.44–1.00)
Chloride: 107 mmol/L (ref 101–111)
GFR calc non Af Amer: >60 mL/min (ref >60)
Glucose, Bld: 140 mg/dL — ABNORMAL HIGH (ref 65–99)
Potassium: 4 mmol/L (ref 3.5–5.1)
SODIUM: 135 mmol/L (ref 135–145)

## 2017-12-04 LAB — GLUCOSE, CAPILLARY
GLUCOSE-CAPILLARY: 131 mg/dL — AB (ref 65–99)
Glucose-Capillary: 110 mg/dL — ABNORMAL HIGH (ref 65–99)
Glucose-Capillary: 156 mg/dL — ABNORMAL HIGH (ref 65–99)

## 2017-12-04 LAB — PHOSPHORUS: PHOSPHORUS: 2.4 mg/dL — AB (ref 2.5–4.6)

## 2017-12-04 LAB — PROCALCITONIN: PROCALCITONIN: 0.18 ng/mL

## 2017-12-04 LAB — CBC
HCT: 27.5 % — ABNORMAL LOW (ref 36.0–46.0)
Hemoglobin: 8.8 g/dL — ABNORMAL LOW (ref 12.0–15.0)
MCH: 27.7 pg (ref 26.0–34.0)
MCHC: 32 g/dL (ref 30.0–36.0)
MCV: 86.5 fL (ref 78.0–100.0)
Platelets: 261 10*3/uL (ref 150–400)
RBC: 3.18 MIL/uL — AB (ref 3.87–5.11)
RDW: 15.1 % (ref 11.5–15.5)
WBC: 13.5 10*3/uL — AB (ref 4.0–10.5)

## 2017-12-04 LAB — MAGNESIUM: MAGNESIUM: 1.5 mg/dL — AB (ref 1.7–2.4)

## 2017-12-04 LAB — HEPARIN LEVEL (UNFRACTIONATED): HEPARIN UNFRACTIONATED: 0.48 [IU]/mL (ref 0.30–0.70)

## 2017-12-04 MED ORDER — DIPHENOXYLATE-ATROPINE 2.5-0.025 MG PO TABS
2.0000 | ORAL_TABLET | Freq: Four times a day (QID) | ORAL | Status: DC | PRN
  Administered 2017-12-04 – 2017-12-06 (×7): 2 via ORAL
  Filled 2017-12-04 (×7): qty 2

## 2017-12-04 MED ORDER — AMIODARONE HCL 200 MG PO TABS
400.0000 mg | ORAL_TABLET | Freq: Two times a day (BID) | ORAL | Status: DC
  Administered 2017-12-04 (×2): 400 mg via ORAL
  Filled 2017-12-04 (×2): qty 2

## 2017-12-04 MED ORDER — MAGNESIUM SULFATE 50 % IJ SOLN: 2.0000 g | Freq: Once | INTRAMUSCULAR | Status: DC

## 2017-12-04 MED ORDER — MAGNESIUM SULFATE 2 GM/50ML IV SOLN
2.0000 g | Freq: Once | INTRAVENOUS | Status: AC
  Administered 2017-12-04: 2 g via INTRAVENOUS
  Filled 2017-12-04: qty 50

## 2017-12-04 NOTE — Progress Notes (Signed)
    Subjective  - POD #6  No acute events   Physical Exam:   abd soft Incisions all clean Brisk pedal doppler signals Left foot with stable patches of ischemia        Assessment/Plan:  POD #6  GI:  tolerating heart healthy diet F?E?N:  Replace Mg ID:  No issues Renal:  Creatinine stable.  Adequate UOP Cardiac:  Upper arm BP's are not accurate due to subclavian occlusive dieases bilaterally.  D/C Neo drip.  COntinue heparin for AFIB Vascular:  stable, letting right foot areas of ischemia demarcate Prophylaxis:  Full dose heparin, protonix Diabetes:  SSI Acute post operative blood loss anemia:  Stable Hypovolemic shock: resolved NEEDS TO GET OOB AND AMBULATE.  PT consult Transfer to 4e PENDING D/C CENTRAL LINE, now with PICC OK to check BP in left arm q shift    [name] 12/04/2017 7:48 AM --  Vitals:   12/04/17 0610 12/04/17 0741  BP: (!) 82/72 (!) 82/72  Pulse: 77 74  Resp: 17 14  Temp:  97.8 F (36.6 C)  SpO2: 100% 99%    Intake/Output Summary (Last 24 hours) at 12/04/2017 0748 Last data filed at 12/04/2017 0600 Gross per 24 hour  Intake 1974.6 ml  Output 0 ml  Net 1974.6 ml     Laboratory CBC    Component Value Date/Time   WBC 13.5 (H) 12/04/2017 0411   HGB 8.8 (L) 12/04/2017 0411   HCT 27.5 (L) 12/04/2017 0411   PLT 261 12/04/2017 0411    BMET    Component Value Date/Time   NA 135 12/04/2017 0411   K 4.0 12/04/2017 0411   CL 107 12/04/2017 0411   CO2 23 12/04/2017 0411   GLUCOSE 140 (H) 12/04/2017 0411   BUN 10 12/04/2017 0411   CREATININE 0.59 12/04/2017 0411   CREATININE 1.24 (H) 08/12/2017 1530   CALCIUM 7.7 (L) 12/04/2017 0411   GFRNONAA >60 12/04/2017 0411   GFRNONAA 50 (L) 08/12/2017 1530   GFRAA >60 12/04/2017 0411   GFRAA 57 (L) 08/12/2017 1530    COAG Lab Results  Component Value Date   INR 1.40 11/28/2017   INR 1.30 11/28/2017   INR 1.18 11/24/2017   No results found for:  PTT  Antibiotics Anti-infectives (From admission, onward)   Start     Dose/Rate Route Frequency Ordered Stop   11/28/17 2200  cefUROXime (ZINACEF) 1.5 g in dextrose 5 % 50 mL IVPB     1.5 g 100 mL/hr over 30 Minutes Intravenous Every 12 hours 11/28/17 2003 11/29/17 2159   11/27/17 0830  ceFAZolin (ANCEF) IVPB 2g/100 mL premix  Status:  Discontinued     2 g 200 mL/hr over 30 Minutes Intravenous To Short Stay 11/27/17 0813 11/28/17 0830   11/25/17 1400  azithromycin (ZITHROMAX) tablet 500 mg  Status:  Discontinued    Comments:  Please dose appropriately for sinus infection   500 mg Oral Daily 11/25/17 1257 11/28/17 2003   11/24/17 2200  doxycycline (VIBRA-TABS) tablet 100 mg  Status:  Discontinued     100 mg Oral 2 times daily 11/24/17 1807 11/28/17 2003       V. Leia Alf, M.D. [facility] Office: [phone] Pager:  [phone]

## 2017-12-04 NOTE — Progress Notes (Signed)
Occupational Therapy Treatment Patient Details Name: [name] MRN: [id number] DOB: 03-12-1966 Today's Date: 12/04/2017    History of present illness 52 year old female with a history of bilateral lower extremity femoral popliteal artery bypasses with vein.  These are both occluded and she also had occluded iliac systems bilaterally.  On 11/28/17 she underwent aortobifemoral bypass grafting as well as revision of the graft on the right and embolectomy of the graft on the left.  At completion the patient had left sided peroneal signal reportedly.  Back to surgery for left embolectomy and  Left popliteal and tibioperoneal trunk endarterectomy and patch angioplasty.  PMH: asthma, depression, diabetes   OT comments  Pt less anxious this visit overall, but still fearful of falling with ambulation. Continues to need 2 person assist for sit to stand from lower surface. Pt performed toilet transferred and agreed to remain up in chair for lunch. VSS throughout session.  Follow Up Recommendations  SNF;Supervision/Assistance - 24 hour    Equipment Recommendations       Recommendations for Other Services      Precautions / Restrictions Precautions Precautions: Fall Precaution Comments: wound vac Restrictions Weight Bearing Restrictions: No LLE Weight Bearing: Partial weight bearing       Mobility Bed Mobility Overal bed mobility: Needs Assistance Bed Mobility: Supine to Sit     Supine to sit: Mod assist     General bed mobility comments: assist for L LE and to raise trunk  Transfers Overall transfer level: Needs assistance Equipment used: 4-wheeled walker Transfers: Sit to/from [facility] Sit to Stand: Min guard;+2 safety/equipment;Mod assist;+2 physical assistance Stand pivot transfers: Min guard;+2 safety/equipment       General transfer comment: stood with walker stabilized and min guard assist with bed elevated, required 2 person mod assist to stand and  steady from [facility]    Balance Overall balance assessment: Needs assistance   Sitting balance-[name]: Fair       Standing balance-[name]: Poor Standing balance comment: reliant on B UE support                           ADL either performed or assessed with clinical judgement   ADL Overall ADL's : Needs assistance/impaired Eating/Feeding: Independent;Sitting                   Lower Body Dressing: Total assistance;Bed level   Toilet Transfer: Stand-pivot;Min guard;+2 for safety/equipment;BSC;RW   Toileting- Clothing Manipulation and Hygiene: Total assistance;Sit to/from stand;+2 for physical assistance               Vision       Perception     Praxis      Cognition Arousal/Alertness: Awake/alert Behavior During Therapy: Anxious Overall Cognitive Status: Within Functional Limits for tasks assessed                                 General Comments: pt much less anxious than upon eval, but fearful of falling when asked to ambulate        Exercises     Shoulder Instructions       General Comments      Pertinent Vitals/ Pain       Pain Assessment: Faces Faces Pain [name]: Hurts little more Pain Location: abdomen, groin, back Pain Descriptors / Indicators: Grimacing;Guarding;Operative site guarding Pain Intervention(s): Monitored during session;Repositioned  Home  Living                                          Prior Functioning/Environment              Frequency  Min 2X/week        Progress Toward Goals  OT Goals(current goals can now be found in the care plan section)  Progress towards OT goals: Progressing toward goals  Acute Rehab OT Goals Patient Stated Goal: to go home OT Goal Formulation: With patient Time For Goal Achievement: 12/15/17 Potential to Achieve Goals: Good  Plan Discharge plan remains appropriate    Co-evaluation    PT/OT/SLP Co-Evaluation/Treatment:  Yes Reason for Co-Treatment: For patient/therapist safety   OT goals addressed during session: ADL's and self-care      AM-PAC PT "6 Clicks" Daily Activity     Outcome Measure   Help from another person eating meals?: None Help from another person taking care of personal grooming?: A Little Help from another person toileting, which includes using toliet, bedpan, or urinal?: A Lot Help from another person bathing (including washing, rinsing, drying)?: A Lot Help from another person to put on and taking off regular upper body clothing?: A Little Help from another person to put on and taking off regular lower body clothing?: Total 6 Click Score: 15    End of Session Equipment Utilized During Treatment: Gait belt;Rolling walker  OT Visit Diagnosis: Unsteadiness on feet (R26.81);Other abnormalities of gait and mobility (R26.89);Pain;Muscle weakness (generalized) (M62.81);Other symptoms and signs involving cognitive function   Activity Tolerance Patient tolerated treatment well   Patient Left in chair;with call bell/phone within reach;with chair alarm set   Nurse Communication Mobility status(feet left down for meal)        Time: 1354-1425 OT Time Calculation (min): 31 min  Charges: OT General Charges $OT Visit: 1 Visit OT Treatments $Self Care/Home Management : 8-22 mins  12/04/2017 Nestor Lewandowsky, OTR/L Pager: [phone]   [name], [name] 12/04/2017, 2:36 PM

## 2017-12-04 NOTE — Progress Notes (Signed)
Physical Therapy Treatment Patient Details Name: [name] MRN: [id number] DOB: [birth year] Today's Date: 12/04/2017    History of Present Illness Pt is a 52 y.o. 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 On 11/28/17 she underwent aortobifemoral bypass grafting, revision of R graft, and embolectomy of L graft.  Pt had to return to sx for left embolectomy and left popliteal/tibioperoneal trunk endarterectomy and patch angioplasty with wound vac placement. PMH: asthma, depression, DM.   PT Comments    Pt only willing to transfer and amb short distance in room with rollator secondary to c/o pain, fatigue, and fear of falling. Requires modA+2 to stand from lower surface of bedside commode. Significant decrease in activity tolerance, but motivated to participate today. Continue to recommend SNF-level therapies at d/c to maximize functional mobility and independence prior to return home.   Follow Up Recommendations  SNF;Supervision/Assistance - 24 hour     Equipment Recommendations  None recommended by PT    Recommendations for Other Services       Precautions / Restrictions Precautions Precautions: Fall Precaution Comments: wound vac Restrictions Weight Bearing Restrictions: No Other Position/Activity Restrictions: No MD orders. Of note, pt with large blister on sole of L foot    Mobility  Bed Mobility Overal bed mobility: Needs Assistance Bed Mobility: Supine to Sit     Supine to sit: Mod assist     General bed mobility comments: ModA for UE support for trunk elevation and to assist LLE to EOB. Increased time and effort secondary to pain and fatigue  Transfers Overall transfer level: Needs assistance Equipment used: 4-wheeled walker Transfers: Sit to/from [facility] Sit to Stand: Min guard;+2 safety/equipment;Mod assist;+2 physical assistance Stand pivot  transfers: Min guard;+2 safety/equipment       General transfer comment: ModA to stabilize rollator. Pt stood with min guard from elevated bed. ModA+2 to stand from [facility] and maintain balance. Increased time and effort secondary to pain and fatigue  Ambulation/Gait Ambulation/Gait assistance: Min guard;+2 safety/equipment Ambulation Distance (Feet): 5 Feet Assistive device: Rolling walker (2 wheeled) Gait Pattern/deviations: Step-to pattern;Decreased weight shift to left;Antalgic Gait velocity: Decreased Gait velocity interpretation: <1.8 ft/sec, indicative of risk for recurrent falls General Gait Details: Slow, controlled amb with min guard for balance. Pt very fearful of falling, declining further amb distance secondary to this. Very fatigued post-amb   Stairs            Wheelchair Mobility    Modified Rankin (Stroke Patients Only)       Balance Overall balance assessment: Needs assistance Sitting-balance support: Bilateral upper extremity supported;Feet supported Sitting balance-Leahy Scale: Fair Sitting balance - Comments: statically able to sit without support   Standing balance support: Bilateral upper extremity supported Standing balance-Leahy Scale: Poor Standing balance comment: reliant on B UE support                            Cognition Arousal/Alertness: Awake/alert Behavior During Therapy: Anxious Overall Cognitive Status: Within Functional Limits for tasks assessed                                 General Comments: pt much less anxious than upon eval, but fearful of falling when asked to ambulate      Exercises      General Comments  Pertinent Vitals/Pain Pain Assessment: Faces Faces Pain Scale: Hurts little more Pain Location: abdomen, groin, back Pain Descriptors / Indicators: Grimacing;Guarding;Operative site guarding Pain Intervention(s): Monitored during session;Repositioned    Home Living                       Prior Function            PT Goals (current goals can now be found in the care plan section) Acute Rehab PT Goals Patient Stated Goal: to go home PT Goal Formulation: With patient Time For Goal Achievement: 12/13/17 Potential to Achieve Goals: Good Progress towards PT goals: Progressing toward goals    Frequency    Min 2X/week      PT Plan Frequency needs to be updated    Co-evaluation PT/OT/SLP Co-Evaluation/Treatment: Yes Reason for Co-Treatment: For patient/therapist safety PT goals addressed during session: Mobility/safety with mobility;Balance;Proper use of DME OT goals addressed during session: ADL's and self-care      AM-PAC PT "6 Clicks" Daily Activity  Outcome Measure  Difficulty turning over in bed (including adjusting bedclothes, sheets and blankets)?: Unable Difficulty moving from lying on back to sitting on the side of the bed? : Unable Difficulty sitting down on and standing up from a chair with arms (e.g., wheelchair, bedside commode, etc,.)?: Unable Help needed moving to and from a bed to chair (including a wheelchair)?: A Little Help needed walking in hospital room?: A Lot Help needed climbing 3-5 steps with a railing? : A Lot 6 Click Score: 10    End of Session Equipment Utilized During Treatment: Gait belt Activity Tolerance: Patient limited by fatigue;Patient limited by pain Patient left: in chair;with call bell/phone within reach Nurse Communication: Mobility status PT Visit Diagnosis: Unsteadiness on feet (R26.81);Muscle weakness (generalized) (M62.81);Pain Pain - part of body: (Abdomen, back)     Time: 8295-6213 PT Time Calculation (min) (ACUTE ONLY): 30 min  Charges:  $Therapeutic Activity: 8-22 mins                    G Codes:      Mabeline Caras, PT, DPT Acute Rehab Services  Pager: [place] 12/04/2017, 4:21 PM

## 2017-12-04 NOTE — Progress Notes (Signed)
Pt arrived to 4e from 2h. Pt oriented to room and staff. Vitals obtained. Telemetry applied and CCMD notified. Pt has loose BM in chair. Pt cleaned up and returned to chair. Will continue current plan of care.   Grant Fontana [name], RN

## 2017-12-04 NOTE — Progress Notes (Signed)
Notified by patient RN that MD  instructed to take blood pressures in PICC arm.  Recommended NO BP be taken in upper arm as per recomennded by hospital policy (I.e. DVT,phlebitis)

## 2017-12-04 NOTE — Progress Notes (Signed)
Progress Note  Patient Name: [name] Date of Encounter: 12/04/2017  Primary Cardiologist: Nelva Bush, MD   Subjective   Breathing OK  NO CP  Abdomen still sore    Inpatient Medications    Scheduled Meds: . ALPRAZolam  0.5 mg Oral Once  . amiodarone  400 mg Oral BID  . Chlorhexidine Gluconate Cloth  6 each Topical Daily  . docusate sodium  100 mg Oral Daily  . insulin aspart  0-9 Units Subcutaneous TID WC  . mouth rinse  15 mL Mouth Rinse BID  . pantoprazole  40 mg Oral Daily  . pregabalin  100 mg Oral TID  . sodium chloride flush  10-40 mL Intracatheter Q12H   Continuous Infusions: . heparin 1,750 Units/hr (12/04/17 1200)   PRN Meds: acetaminophen **OR** acetaminophen, alum & mag hydroxide-simeth, bisacodyl, dicyclomine, guaiFENesin-dextromethorphan, HYDROmorphone (DILAUDID) injection, ondansetron, ondansetron, oxyCODONE-acetaminophen, phenol, sodium chloride flush   Vital Signs    Vitals:   12/04/17 0800 12/04/17 1000 12/04/17 1157 12/04/17 1200  BP:  (!) 56/38 (!) 89/73   Pulse: 84 84 74 74  Resp: 10 14 14 11   Temp:   98.2 F (36.8 C)   TempSrc:   Oral   SpO2: 99% 94% 97% 97%  Weight:      Height:        Intake/Output Summary (Last 24 hours) at 12/04/2017 1515 Last data filed at 12/04/2017 1200 Gross per 24 hour  Intake 1723.1 ml  Output 0 ml  Net 1723.1 ml   Filed Weights   11/27/17 0444 11/28/17 0354 12/02/17 0500  Weight: 157 lb 3.2 oz (71.3 kg) 166 lb 1.6 oz (75.3 kg) 193 lb 9 oz (87.8 kg)    Telemetry    SR w/ frequent PVCs and trigeminy.  NOw  Atrial fib, RVR w/ PVCs seen am of 01/13 thru am of 01/14 - Personally Reviewed  ECG    01/16, SR  PVCs - Personally Reviewed  Physical Exam   GEN: WD, female, No acute distress.   Neck: No JVD seen  Cardiac:  RRR  No signif mrumurs Respiratory: Rhonchi GI: Soft, nontender, non-distended  MS: 1+ LE edema;   Ischemic changes L foot   Neuro:  Nonfocal  Psych: Normal affect   Labs    Chemistry Recent Labs  Lab 11/29/17 0425  12/02/17 0330 12/03/17 0350 12/04/17 0411  NA 134*   < > 136 135 135  K 4.0   < > 3.7 3.8 4.0  CL 106   < > 107 107 107  CO2 19*   < > 22 20* 23  GLUCOSE 160*   < > 127* 98 140*  BUN 14   < > 14 10 10   CREATININE 0.85   < > 0.66 0.66 0.59  CALCIUM 7.5*   < > 8.0* 8.1* 7.7*  PROT 4.2*  --   --  4.3*  --   ALBUMIN 1.9*  --   --  1.5*  --   AST 37  --   --  19  --   ALT 12*  --   --  13*  --   ALKPHOS 94  --   --  128*  --   BILITOT 0.5  --   --  0.7  --   GFRNONAA >60   < > >60 >60 >60  GFRAA >60   < > >60 >60 >60  ANIONGAP 9   < > 7 8 5    < > =  values in this interval not displayed.     Hematology Recent Labs  Lab 12/02/17 0330 12/03/17 0350 12/04/17 0411  WBC 27.9* 17.7* 13.5*  RBC 3.28* 3.44* 3.18*  HGB 9.5* 9.5* 8.8*  HCT 28.2* 29.8* 27.5*  MCV 86.0 86.6 86.5  MCH 29.0 27.6 27.7  MCHC 33.7 31.9 32.0  RDW 15.2 15.4 15.1  PLT 336 347 261    Cardiac Enzymes Recent Labs  Lab 11/30/17 0219 12/02/17 0330 12/02/17 0930  TROPONINI <0.03 0.14* <0.03      BNPNo results for input(s): BNP, PROBNP in the last 168 hours.    Radiology    No results found.  Cardiac Studies   Myoview 03/12/2017 Pharmacological myocardial perfusion imaging study with no significant Ischemia Large region of fixed perfusion defect in the basal to mid inferior wall, inferior lateral mid to distal wall concerning for previous MI  Hypokinesis noted in the basal to mid inferior wall, EF estimated at 43% No EKG changes concerning for ischemia at peak stress or in recovery. Moderate to high risk and given size of perfusion defect detailed above  Echo:  04/17/2017   - Technically difficult study due to chest wall and/or lung   interference. - Left ventricle: The cavity size was normal. Wall thickness was   increased in a pattern of mild LVH. Systolic function was normal.   The estimated ejection fraction was in the range of 55% to 60%.    The study is not technically sufficient to allow evaluation of LV   diastolic function. - Mitral valve: Calcified annulus. Mildly thickened leaflets . - Right ventricle: The cavity size was normal. Systolic function   was normal. - Pericardium, extracardiac: A trivial pericardial effusion was   identified.  Patient Profile     52 y.o. 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 Cards saw preop, no further eval. 01/15, cards asked to reassess for atrial fib, RVR.   Assessment & Plan      1. Paroxysmal atrial fibrillation with rapid ventricular response (HCC)- CHA2DS2VASc= 4 (female, HTN, DM, PAD)  Remains in SR on IV amiodarone  Would switch to po  Do not want recurrence.  May d/c prior to leaving hospital as started in acute setting   Do not want recurrence right now however.      For questions or updates, please contact [place] Please consult [email or web address] for contact info under Cardiology/STEMI.      Signed, Dorris Carnes, MD  12/04/2017, 3:15 PM

## 2017-12-04 NOTE — Progress Notes (Signed)
ANTICOAGULATION CONSULT NOTE - Follow Up Consult  Pharmacy Consult for Heparin Indication: occluded fem-pop bypass  No Known Allergies  Patient Measurements: Height: 5\' 7"  (170.2 cm) Weight: 193 lb 9 oz (87.8 kg) IBW/kg (Calculated) : 61.6 Heparin Dosing Weight: 73.5 kg  Vital Signs: Temp: 97.8 F (36.6 C) (01/17 0741) Temp Source: Oral (01/17 0741) BP: 82/72 (01/17 0741) Pulse Rate: 84 (01/17 0800)  Labs: Recent Labs    12/02/17 0330 12/02/17 0338 12/02/17 0930 12/03/17 0350 12/04/17 0411  HGB 9.5*  --   --  9.5* 8.8*  HCT 28.2*  --   --  29.8* 27.5*  PLT 336  --   --  347 261  HEPARINUNFRC  --  0.37  --  0.40 0.48  CREATININE 0.66  --   --  0.66 0.59  TROPONINI 0.14*  --  <0.03  --   --     Estimated Creatinine Clearance: 92.6 mL/min (by C-G formula based on SCr of 0.59 mg/dL).   Medications:  Scheduled:  . ALPRAZolam  0.5 mg Oral Once  . Chlorhexidine Gluconate Cloth  6 each Topical Daily  . docusate sodium  100 mg Oral Daily  . insulin aspart  0-9 Units Subcutaneous TID WC  . mouth rinse  15 mL Mouth Rinse BID  . pantoprazole  40 mg Oral Daily  . pregabalin  100 mg Oral TID  . sodium chloride flush  10-40 mL Intracatheter Q12H   Infusions:  . amiodarone 30 mg/hr (12/04/17 0600)  . heparin 1,750 Units/hr (12/04/17 0600)  . magnesium sulfate 1 - 4 g bolus IVPB 2 g (12/04/17 0920)    Assessment: 52 yo F who presented with LLE pain and presummed recurrent restenosis of LE bypass graft. Went back to OR 1/11 for redo fem-pop. She was is afib but currently back in NSR. Per [facility]- Continuing heparin due to appearance of foot.  -heparin level is at goal on 1750 units/hr, CBC stable  Goal of Therapy:  Heparin level 0.3-0.7 units/ml Monitor platelets by anticoagulation protocol: Yes    Plan:  -No heparin changes needed -Continue daily heparin level and CBC  Hildred Laser, Pharm D 12/04/2017 10:05 AM

## 2017-12-04 NOTE — Progress Notes (Signed)
RIJ removed, pressure applied, no sign of distress. Pt educated and call be in reach.

## 2017-12-04 NOTE — Progress Notes (Signed)
@  0015 Paged [name] on-call for Dr. Trula Slade regarding pt's lower BP readings (60s-90s SBP, MAPS 60s-70s) in Left Upper Extremity. Prior measurements taken in Right Upper Extremity which is now restricted due to PICC line placement. 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 Stephens Shire most recent note "Upper arm BP's are not accurate". Secondary to pt's recent Aorto-Bifemoral Bypass Lower Extremity BPs cannot be obtained. Clinically pt appears to be perfusing well, with adequate capillary refill in all 4 extremities, unchanged pulses, and appropriate mentation. [name] verbally endorsed BPs could still be confirmed in Right Upper Extremity despite presence of PICC line.Corresponding nursing order placed. Will continue to monitor.

## 2017-12-05 LAB — COMPREHENSIVE METABOLIC PANEL
ALT: 10 U/L — AB (ref 14–54)
ANION GAP: 8 (ref 5–15)
AST: 13 U/L — ABNORMAL LOW (ref 15–41)
Albumin: 1.6 g/dL — ABNORMAL LOW (ref 3.5–5.0)
Alkaline Phosphatase: 90 U/L (ref 38–126)
BUN: 12 mg/dL (ref 6–20)
CO2: 23 mmol/L (ref 22–32)
CREATININE: 0.83 mg/dL (ref 0.44–1.00)
Calcium: 7.8 mg/dL — ABNORMAL LOW (ref 8.9–10.3)
Chloride: 105 mmol/L (ref 101–111)
Glucose, Bld: 134 mg/dL — ABNORMAL HIGH (ref 65–99)
Potassium: 4 mmol/L (ref 3.5–5.1)
SODIUM: 136 mmol/L (ref 135–145)
Total Bilirubin: 0.3 mg/dL (ref 0.3–1.2)
Total Protein: 4.4 g/dL — ABNORMAL LOW (ref 6.5–8.1)

## 2017-12-05 LAB — CBC
HCT: 27.5 % — ABNORMAL LOW (ref 36.0–46.0)
HEMOGLOBIN: 8.8 g/dL — AB (ref 12.0–15.0)
MCH: 28.2 pg (ref 26.0–34.0)
MCHC: 32 g/dL (ref 30.0–36.0)
MCV: 88.1 fL (ref 78.0–100.0)
Platelets: 292 10*3/uL (ref 150–400)
RBC: 3.12 MIL/uL — ABNORMAL LOW (ref 3.87–5.11)
RDW: 15.7 % — AB (ref 11.5–15.5)
WBC: 14.7 10*3/uL — ABNORMAL HIGH (ref 4.0–10.5)

## 2017-12-05 LAB — CULTURE, BLOOD (ROUTINE X 2)
CULTURE: NO GROWTH
CULTURE: NO GROWTH
SPECIAL REQUESTS: ADEQUATE
SPECIAL REQUESTS: ADEQUATE

## 2017-12-05 LAB — GLUCOSE, CAPILLARY
GLUCOSE-CAPILLARY: 167 mg/dL — AB (ref 65–99)
Glucose-Capillary: 152 mg/dL — ABNORMAL HIGH (ref 65–99)
Glucose-Capillary: 142 mg/dL — ABNORMAL HIGH (ref 65–99)
Glucose-Capillary: 137 mg/dL — ABNORMAL HIGH (ref 65–99)
Glucose-Capillary: 146 mg/dL — ABNORMAL HIGH (ref 65–99)

## 2017-12-05 LAB — HEPARIN LEVEL (UNFRACTIONATED): Heparin Unfractionated: 0.58 IU/mL (ref 0.30–0.70)

## 2017-12-05 LAB — MAGNESIUM: MAGNESIUM: 1.7 mg/dL (ref 1.7–2.4)

## 2017-12-05 MED ORDER — ROSUVASTATIN CALCIUM 10 MG PO TABS
20.0000 mg | ORAL_TABLET | Freq: Every day | ORAL | Status: DC
  Administered 2017-12-05 – 2017-12-09 (×5): 20 mg via ORAL
  Filled 2017-12-05 (×5): qty 2

## 2017-12-05 MED ORDER — POTASSIUM CHLORIDE CRYS ER 20 MEQ PO TBCR
20.0000 meq | EXTENDED_RELEASE_TABLET | Freq: Once | ORAL | Status: AC
  Administered 2017-12-05: 20 meq via ORAL
  Filled 2017-12-05: qty 1

## 2017-12-05 MED ORDER — ROSUVASTATIN CALCIUM 10 MG PO TABS: 20.0000 mg | ORAL_TABLET | Freq: Every day | ORAL | Status: DC

## 2017-12-05 MED ORDER — FUROSEMIDE 10 MG/ML IJ SOLN
40.0000 mg | Freq: Once | INTRAMUSCULAR | Status: AC
  Administered 2017-12-05: 40 mg via INTRAVENOUS
  Filled 2017-12-05: qty 4

## 2017-12-05 NOTE — Progress Notes (Signed)
Spoke w Lennie Muckle PA for clarity with [facility] plan. At this time, will await MD rec after Monday's assessment of wounds to order VACs for home.

## 2017-12-05 NOTE — Progress Notes (Signed)
Progress Note  Patient Name: [name] Date of Encounter: 12/05/2017  Primary Cardiologist: Nelva Bush, MD   Subjective   Breathing is OK  No CP    Inpatient Medications    Scheduled Meds: . ALPRAZolam  0.5 mg Oral Once  . Chlorhexidine Gluconate Cloth  6 each Topical Daily  . docusate sodium  100 mg Oral Daily  . insulin aspart  0-9 Units Subcutaneous TID WC  . mouth rinse  15 mL Mouth Rinse BID  . pantoprazole  40 mg Oral Daily  . pregabalin  100 mg Oral TID  . rosuvastatin  20 mg Oral q1800  . sodium chloride flush  10-40 mL Intracatheter Q12H   Continuous Infusions: . heparin 1,750 Units/hr (12/05/17 0559)   PRN Meds: acetaminophen **OR** acetaminophen, alum & mag hydroxide-simeth, bisacodyl, dicyclomine, diphenoxylate-atropine, guaiFENesin-dextromethorphan, HYDROmorphone (DILAUDID) injection, ondansetron, ondansetron, oxyCODONE-acetaminophen, phenol, sodium chloride flush   Vital Signs    Vitals:   12/05/17 0017 12/05/17 0024 12/05/17 0335 12/05/17 1023  BP:  (!) 99/53 (!) 74/49 (!) 102/55  Pulse:  83  69  Resp:  (!) 23  14  Temp: 97.8 F (36.6 C) 97.8 F (36.6 C) 98.7 F (37.1 C) 98.4 F (36.9 C)  TempSrc: Oral Oral Oral Oral  SpO2:  100%  97%  Weight:      Height:        Intake/Output Summary (Last 24 hours) at 12/05/2017 1311 Last data filed at 12/05/2017 1000 Gross per 24 hour  Intake 742.5 ml  Output 850 ml  Net -107.5 ml   Filed Weights   11/27/17 0444 11/28/17 0354 12/02/17 0500  Weight: 157 lb 3.2 oz (71.3 kg) 166 lb 1.6 oz (75.3 kg) 193 lb 9 oz (87.8 kg)    Telemetry    SR  Personally Reviewed  ECG      Physical Exam   GEN: No acute distress.   Neck: Laying flat  Neck full Cardiac: RRR, no murmurs, rubs, or gallops.  Respiratory: Clear to auscultation bilaterally. GI: Soft, nontender, non-distended  MS: Tr LE edema; No deformity. Neuro:  Nonfocal  Psych: Normal affect   Labs    Chemistry Recent Labs  Lab  11/29/17 0425  12/03/17 0350 12/04/17 0411 12/05/17 0846  NA 134*   < > 135 135 136  K 4.0   < > 3.8 4.0 4.0  CL 106   < > 107 107 105  CO2 19*   < > 20* 23 23  GLUCOSE 160*   < > 98 140* 134*  BUN 14   < > 10 10 12   CREATININE 0.85   < > 0.66 0.59 0.83  CALCIUM 7.5*   < > 8.1* 7.7* 7.8*  PROT 4.2*  --  4.3*  --  4.4*  ALBUMIN 1.9*  --  1.5*  --  1.6*  AST 37  --  19  --  13*  ALT 12*  --  13*  --  10*  ALKPHOS 94  --  128*  --  90  BILITOT 0.5  --  0.7  --  0.3  GFRNONAA >60   < > >60 >60 >60  GFRAA >60   < > >60 >60 >60  ANIONGAP 9   < > 8 5 8    < > = values in this interval not displayed.     Hematology Recent Labs  Lab 12/03/17 0350 12/04/17 0411 12/05/17 0846  WBC 17.7* 13.5* 14.7*  RBC 3.44* 3.18* 3.12*  HGB 9.5* 8.8* 8.8*  HCT 29.8* 27.5* 27.5*  MCV 86.6 86.5 88.1  MCH 27.6 27.7 28.2  MCHC 31.9 32.0 32.0  RDW 15.4 15.1 15.7*  PLT 347 261 292    Cardiac Enzymes Recent Labs  Lab 11/30/17 0219 12/02/17 0330 12/02/17 0930  TROPONINI <0.03 0.14* <0.03   No results for input(s): TROPIPOC in the last 168 hours.   BNPNo results for input(s): BNP, PROBNP in the last 168 hours.   DDimer No results for input(s): DDIMER in the last 168 hours.   Radiology    No results found.  Cardiac Studies    Patient Profile     52 y.o. female  Hx of HTN, HL, DM, PAD  Admitted 1/7 with acute occlusion of L fem pop  Seen by cardiology preop   Asked to reassess postop for AFib with RVR  Assessment & Plan    1  Post op Atrial fib  Has remained in SR I would recomm stopping amiodarone and following  She did not have until post op period  May not have after  Follow on telemetry  2  Edema:  Not severe   Would give lasix 40 IV x 1  Follow   3  HL  Not on a statin  Would start  Follow as outpt    For questions or updates, please contact [place] Please consult [email or web address] for contact info under Cardiology/STEMI.      Signed, Dorris Carnes, MD  12/05/2017,  1:11 PM

## 2017-12-05 NOTE — Consult Note (Signed)
[facility] Nurse wound consult note Reason for Consult: replace Prevena dressings with traditional incisional NPWT hooked to hospital NPWT device.  Wound type: surgical  Pressure Injury POA:NA Measurement: bilateral groin wounds Left 5cm right 6cm  Wound KAJ:GOTLXB incision  Drainage (amount, consistency, odor) none Periwound: intact  Dressing procedure/placement/frequency: Removed Prevena dressings per MD orders Bilateral groin incisions each covered with Mepitel Ostomy barrier strips used in the skin folds to aid in seal of dressings 1 strip of black foam used over incisions for incisional NPWT  Suction at 176mmHG. Per previous VAC settings. Two sites Yconnected to the single hospital unit.   Home unit will not be strong enough for two sites.  Incisional NPWT is not covered in [facility]. Will need incisional DCed at time of DC to home.   [place] Nurse team will follow along with you for weekly wound assessments.  Please notify me of any acute changes in the wounds or any new areas of concerns [place] MSN, RN,CWOCN, [place], [place]

## 2017-12-05 NOTE — Progress Notes (Addendum)
[facility] [place]  Subjective  - Doing better everyday.  Left foot blisters "popped" last night.   Objective (!) 74/49 83 98.7 F (37.1 C) (Oral) (!) 23 100%  Intake/Output Summary (Last 24 hours) at 12/05/2017 0733 Last data filed at 12/05/2017 0336 Gross per 24 hour  Intake 911 ml  Output 350 ml  Net 561 ml    Doppler DP B Abdomin soft, incision healing well Bilateral groins with previna vacs in place Left foot improved appearance with decreased ischemic tissue changes, blisters have deflated, no sign of infection in surrounding tissue. Minimal left ankle motion. Heart RRR Lungs non labored breathing on RA  Assessment/Planning: POD # 7  :  51 y.o. female is s/p  Procedure:#1: Aortobifemoral bypass graft using a14 x 7 bifurcated Dacron graft #2: Reimplantation, inferior mesenteric artery #3: Redo bilateral common femoral artery exposure #4: Thrombectomy of bilateral femoral below-knee popliteal artery bypass graft #5: Reimplantation of left femoral-popliteal bypass graft to the aortobifemoral graft #6: Revision of proximal portion of right femoral popliteal bypass graft using a interposition 6 mm propatent Gore-Tex graft #7: Left lower extremity angiogram #8: Intra-arterial administration of nitroglycerin #9: Left common femoral and profunda femoral endarterectomy #10: Aortic endarterectomy #11: Placement of bilateral femoral Provena wound vacs  Days Post-Op and Procedure Performed: 1. Re-exposure of left below knee arteries > 30 days 2. Embolectomy of left lower extremity 3. Left popliteal and tibioperoneal trunk endarterectomy and patch angioplasty 4. Harvest of  left lesser saphenous vein  Days Post-Op  GI bouts of diarrhea managed with Imodium UO  Good per patient documented 350 cc total Tolerating PO's without nausea Cardiac Heparin for A fib Post op anemia 3 units transfused total HGB 8.8 No sign of active bleeding.  Fluids KVO Cr normal 0.59  Working on mobility PT recommending SNF when stable Previna is on day #7.  I would suggest removing for wound inspection and then replacing with new previna for groin incision protection due to body habitus.  I will talk with Dr. Trula Slade. Leukocytosis improving , Afebrile Hypotension [phone], asymptomatic A& O x 3 eating breakfast.   [name] 12/05/2017 7:33 AM --  Laboratory Lab Results: Recent Labs    12/03/17 0350 12/04/17 0411  WBC 17.7* 13.5*  HGB 9.5* 8.8*  HCT 29.8* 27.5*  PLT 347 261   BMET Recent Labs    12/03/17 0350 12/04/17 0411  NA 135 135  K 3.8 4.0  CL 107 107  CO2 20* 23  GLUCOSE 98 140*  BUN 10 10  CREATININE 0.66 0.59  CALCIUM 8.1* 7.7*    COAG Lab Results  Component Value Date   INR 1.40 11/28/2017   INR 1.30 11/28/2017   INR 1.18 11/24/2017   No results found for: PTT

## 2017-12-05 NOTE — Care Management Note (Addendum)
Case Management Note  Patient Details  Name: [name] MRN: [id number] Date of Birth: [birth year]  Subjective/Objective:                 Patient from home w spouse, and father in law. States spouse and FIL will covr 24 hour care. She states she has all needed DME, RW and 3/1. Would like to use [facility] for [facility], referred to [place], [facility]. Currently has VACs not draining, for dressing change today. Will continue to follow.   Action/Plan:   Expected Discharge Date:                  Expected Discharge Plan:  [place]  In-House Referral:     Discharge planning Services  CM Consult  Post Acute Care Choice:  Home Health Choice offered to:  Patient  DME Arranged:    DME Agency:     HH Arranged:  RN [place] Agency:  [facility]  Status of Service:  Completed, signed off  If discussed at [place] of Stay Meetings, dates discussed:    Additional Comments:  Carles Collet, RN 12/05/2017, 11:08 AM

## 2017-12-05 NOTE — Progress Notes (Signed)
ANTICOAGULATION CONSULT NOTE - Follow Up Consult  Pharmacy Consult for Heparin Indication: occluded fem-pop bypass  No Known Allergies  Patient Measurements: Height: 5\' 7"  (170.2 cm) Weight: 193 lb 9 oz (87.8 kg) IBW/kg (Calculated) : 61.6 Heparin Dosing Weight: 73.5 kg  Vital Signs: Temp: 98.7 F (37.1 C) (01/18 0335) Temp Source: Oral (01/18 0335) BP: 74/49 (01/18 0335) Pulse Rate: 83 (01/18 0024)  Labs: Recent Labs    12/03/17 0350 12/04/17 0411 12/05/17 0846  HGB 9.5* 8.8* 8.8*  HCT 29.8* 27.5* 27.5*  PLT 347 261 292  HEPARINUNFRC 0.40 0.48 0.58  CREATININE 0.66 0.59  --     Estimated Creatinine Clearance: 92.6 mL/min (by C-G formula based on SCr of 0.59 mg/dL).   Medications:  Scheduled:  . ALPRAZolam  0.5 mg Oral Once  . amiodarone  400 mg Oral BID  . Chlorhexidine Gluconate Cloth  6 each Topical Daily  . docusate sodium  100 mg Oral Daily  . insulin aspart  0-9 Units Subcutaneous TID WC  . mouth rinse  15 mL Mouth Rinse BID  . pantoprazole  40 mg Oral Daily  . pregabalin  100 mg Oral TID  . sodium chloride flush  10-40 mL Intracatheter Q12H   Infusions:  . 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 Went back to OR 1/11 for redo fem-pop. She was is afib but currently back in NSR. Per [facility]- Continuing heparin due to appearance of foot.   Heparin level this morning is in therapeutic range, on 1750 units/hr. CBC is stable. No signs/symptoms of bleeding. No infusion issues per nursing.   Goal of Therapy:  Heparin level 0.3-0.7 units/ml Monitor platelets by anticoagulation protocol: Yes   Plan:  -Continue heparin infusion at 1750 units/hr -Continue daily heparin level and CBC -Follow up with anticoagulation plans as outpatient  Doylene Canard, PharmD Clinical Pharmacist  Pager: [phone] Clinical Phone for 12/05/2017 until 3:30pm: [phone] If after  3:30pm, please call main pharmacy at [phone]  12/05/2017 9:49 AM

## 2017-12-06 ENCOUNTER — Other Ambulatory Visit: Payer: Self-pay | Admitting: Physician Assistant

## 2017-12-06 DIAGNOSIS — E8770 Fluid overload, unspecified: Secondary | ICD-10-CM

## 2017-12-06 DIAGNOSIS — I480 Paroxysmal atrial fibrillation: Secondary | ICD-10-CM

## 2017-12-06 LAB — CBC
HEMATOCRIT: 27.3 % — AB (ref 36.0–46.0)
HEMOGLOBIN: 8.6 g/dL — AB (ref 12.0–15.0)
MCH: 27.6 pg (ref 26.0–34.0)
MCHC: 31.5 g/dL (ref 30.0–36.0)
MCV: 87.5 fL (ref 78.0–100.0)
Platelets: 321 10*3/uL (ref 150–400)
RBC: 3.12 MIL/uL — AB (ref 3.87–5.11)
RDW: 15.6 % — AB (ref 11.5–15.5)
WBC: 16.5 10*3/uL — ABNORMAL HIGH (ref 4.0–10.5)

## 2017-12-06 LAB — GLUCOSE, CAPILLARY
GLUCOSE-CAPILLARY: 164 mg/dL — AB (ref 65–99)
GLUCOSE-CAPILLARY: 141 mg/dL — AB (ref 65–99)
GLUCOSE-CAPILLARY: 144 mg/dL — AB (ref 65–99)
Glucose-Capillary: 174 mg/dL — ABNORMAL HIGH (ref 65–99)

## 2017-12-06 LAB — HEPARIN LEVEL (UNFRACTIONATED): Heparin Unfractionated: 0.58 IU/mL (ref 0.30–0.70)

## 2017-12-06 MED ORDER — FUROSEMIDE 10 MG/ML IJ SOLN
40.0000 mg | Freq: Two times a day (BID) | INTRAMUSCULAR | Status: DC
Start: 2017-12-06 — End: 2017-12-09
  Administered 2017-12-06 – 2017-12-09 (×6): 40 mg via INTRAVENOUS
  Filled 2017-12-06 (×6): qty 4

## 2017-12-06 NOTE — Progress Notes (Signed)
ANTICOAGULATION CONSULT NOTE - Follow Up Consult  Pharmacy Consult for Heparin Indication: occluded fem-pop bypass  No Known Allergies  Patient Measurements: Height: 5\' 7"  (170.2 cm) Weight: 204 lb 12.9 oz (92.9 kg) IBW/kg (Calculated) : 61.6 Heparin Dosing Weight: 73.5 kg  Vital Signs: Temp: 97.5 F (36.4 C) (01/19 0808) Temp Source: Oral (01/19 0808) BP: 112/69 (01/19 0808) Pulse Rate: 87 (01/19 0808)  Labs: Recent Labs    12/04/17 0411 12/05/17 0846 12/06/17 0312  HGB 8.8* 8.8* 8.6*  HCT 27.5* 27.5* 27.3*  PLT 261 292 321  HEPARINUNFRC 0.48 0.58 0.58  CREATININE 0.59 0.83  --     Estimated Creatinine Clearance: 91.7 mL/min (by C-G formula based on SCr of 0.83 mg/dL).   Medications:  Scheduled:  . ALPRAZolam  0.5 mg Oral Once  . Chlorhexidine Gluconate Cloth  6 each Topical Daily  . docusate sodium  100 mg Oral Daily  . insulin aspart  0-9 Units Subcutaneous TID WC  . mouth rinse  15 mL Mouth Rinse BID  . pantoprazole  40 mg Oral Daily  . pregabalin  100 mg Oral TID  . rosuvastatin  20 mg Oral q1800  . sodium chloride flush  10-40 mL Intracatheter Q12H   Infusions:  . 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 Went back to OR 1/11 for redo fem-pop. She was is afib but currently back in NSR. Per [facility]- Continuing heparin due to appearance of foot.   Heparin level this morning is in therapeutic range, on 1750 units/hr. CBC is stable. No signs/symptoms of bleeding documented.  Goal of Therapy:  Heparin level 0.3-0.7 units/ml Monitor platelets by anticoagulation protocol: Yes   Plan:  -Continue heparin infusion at 1750 units/hr -Continue daily heparin level and CBC -Follow up with PO anticoagulation plans, s/sx bleeding  Elicia Lamp, PharmD, BCPS Clinical Pharmacist Clinical phone for 12/06/2017 until 3:30pm: [phone] If after 3:30pm, please call main  pharmacy at: [phone] 12/06/2017 10:40 AM

## 2017-12-06 NOTE — Progress Notes (Signed)
Progress Note  Patient Name: [name] Date of Encounter: 12/06/2017  Primary Cardiologist: Nelva Bush, MD   Subjective  Emotional crying about loose stools and being dependant on people Still Has heavy feeling in legs   Inpatient Medications    Scheduled Meds: . ALPRAZolam  0.5 mg Oral Once  . Chlorhexidine Gluconate Cloth  6 each Topical Daily  . docusate sodium  100 mg Oral Daily  . insulin aspart  0-9 Units Subcutaneous TID WC  . mouth rinse  15 mL Mouth Rinse BID  . pantoprazole  40 mg Oral Daily  . pregabalin  100 mg Oral TID  . rosuvastatin  20 mg Oral q1800  . sodium chloride flush  10-40 mL Intracatheter Q12H   Continuous Infusions: . heparin 1,750 Units/hr (12/05/17 0559)   PRN Meds: acetaminophen **OR** acetaminophen, alum & mag hydroxide-simeth, bisacodyl, dicyclomine, diphenoxylate-atropine, guaiFENesin-dextromethorphan, HYDROmorphone (DILAUDID) injection, ondansetron, ondansetron, oxyCODONE-acetaminophen, phenol, sodium chloride flush   Vital Signs    Vitals:   12/05/17 2358 12/06/17 0325 12/06/17 0808 12/06/17 1200  BP: (!) 85/51 90/70 112/69 139/76  Pulse:  79 87 93  Resp:   (!) 25 (!) 22  Temp: 98.3 F (36.8 C) 98 F (36.7 C) (!) 97.5 F (36.4 C)   TempSrc: Oral Oral Oral Oral  SpO2: 98% 99% 98% 99%  Weight:  204 lb 12.9 oz (92.9 kg)    Height:        Intake/Output Summary (Last 24 hours) at 12/06/2017 1303 Last data filed at 12/06/2017 1108 Gross per 24 hour  Intake 1380 ml  Output 2600 ml  Net -1220 ml   Filed Weights   11/28/17 0354 12/02/17 0500 12/06/17 0325  Weight: 166 lb 1.6 oz (75.3 kg) 193 lb 9 oz (87.8 kg) 204 lb 12.9 oz (92.9 kg)    Telemetry    SR no PAF 12/06/2017   Personally Reviewed  ECG   SR PVC RBBB 12/02/17   Physical Exam   Crying  Chronically ill  HEENT: normal Neck supple with no adenopathy JVP normal no bruits no thyromegaly Lungs clear with no wheezing and good diaphragmatic motion Heart:   S1/S2 no murmur, no rub, gallop or click PMI normal Abdomen: post vascular surgery with large midline scar wound vac both iliac regions Edema to thighs bilaterally Neuro non-focal Skin warm and dry No muscular weakness   Labs    Chemistry Recent Labs  Lab 12/03/17 0350 12/04/17 0411 12/05/17 0846  NA 135 135 136  K 3.8 4.0 4.0  CL 107 107 105  CO2 20* 23 23  GLUCOSE 98 140* 134*  BUN 10 10 12   CREATININE 0.66 0.59 0.83  CALCIUM 8.1* 7.7* 7.8*  PROT 4.3*  --  4.4*  ALBUMIN 1.5*  --  1.6*  AST 19  --  13*  ALT 13*  --  10*  ALKPHOS 128*  --  90  BILITOT 0.7  --  0.3  GFRNONAA >60 >60 >60  GFRAA >60 >60 >60  ANIONGAP 8 5 8      Hematology Recent Labs  Lab 12/04/17 0411 12/05/17 0846 12/06/17 0312  WBC 13.5* 14.7* 16.5*  RBC 3.18* 3.12* 3.12*  HGB 8.8* 8.8* 8.6*  HCT 27.5* 27.5* 27.3*  MCV 86.5 88.1 87.5  MCH 27.7 28.2 27.6  MCHC 32.0 32.0 31.5  RDW 15.1 15.7* 15.6*  PLT 261 292 321    Cardiac Enzymes Recent Labs  Lab 11/30/17 0219 12/02/17 0330 12/02/17 0930  TROPONINI <0.03 0.14* <  0.03   No results for input(s): TROPIPOC in the last 168 hours.    Radiology    No results found.  Cardiac Studies    Patient Profile     52 y.o. female  Hx of HTN, HL, DM, PAD  Admitted 1/7 with acute occlusion of L fem pop  Seen by cardiology preop   Asked to reassess postop for AFib with RVR  Assessment & Plan    1  Post op Atrial fib  Has remained in SR on heparin stable   2  Edema:  Has significant volume overload both legs / thighs and lower abdomen EF normal by echo 04/17/17 Will need bid lasix   3  HL  Not on a statin  Would start  Follow as outpt    4. Diarrhea: on max dose lomotil   For questions or updates, please contact [place] Please consult [email or web address] for contact info under Cardiology/STEMI.      Signed, Jenkins Rouge, MD  12/06/2017, 1:03 PM

## 2017-12-06 NOTE — Progress Notes (Signed)
Per Dr. Lysbeth Penner request, sent message to office scheduler to arrange 30 day monitor after discharge -Dr. Debara Pickett recommends she f/u with Dr. Saunders Revel after this is completed. Yancey Pedley PA-C

## 2017-12-06 NOTE — Progress Notes (Signed)
Awaiting SNF placement - agree with recommendation to stop amiodarone. Maintaining sinus. Given IV lasix yesterday with good diuresis. No BMET today. CHADSVASC score of 4, however had <24 hrs post-op a-fib without recurrence. I think we do not have to start a NOAC for a-fib, however, anticoagulation for PAD as recommended per vascular surgery. Would consider outpatient 30 day monitor after discharge to look for recurrent a-fib. We will work on arranging this. She can follow-up afterwards with Dr. Saunders Revel.   Will sign-off. Call with questions.  Pixie Casino, MD, [facility], [place] Director of the Advanced Lipid Disorders &  Cardiovascular Risk Reduction Clinic Diplomate of the American Board of Clinical Lipidology Attending Cardiologist  Direct Dial: [phone]  Fax: [phone]  Website:  [email or web address]

## 2017-12-06 NOTE — Plan of Care (Signed)
Activity: Risk for activity intolerance will decrease 12/06/2017 0036 - Progressing by Jaymes Graff, RN  Pt up and down to [facility] several times this pm, has also dangled and spent several minutes standing beside bed.

## 2017-12-06 NOTE — Progress Notes (Addendum)
  Progress Note    12/06/2017 8:55 AM 8 Days Post-Op  Subjective:  No new complaints this morning.  Prefers discharge home versus SNF or rehab   Vitals:   12/06/17 0325 12/06/17 0808  BP: 90/70 112/69  Pulse: 79 87  Resp:  (!) 25  Temp: 98 F (36.7 C) (!) 97.5 F (36.4 C)  SpO2: 99% 98%   Physical Exam: Lungs:  Non labored Incisions:  Vac dressings left in place Extremities:  Feet symmetrically warm with minimal blistering and drainage Abdomen:  Soft; incision stable Neurologic: A&O  CBC    Component Value Date/Time   WBC 16.5 (H) 12/06/2017 0312   RBC 3.12 (L) 12/06/2017 0312   HGB 8.6 (L) 12/06/2017 0312   HCT 27.3 (L) 12/06/2017 0312   PLT 321 12/06/2017 0312   MCV 87.5 12/06/2017 0312   MCH 27.6 12/06/2017 0312   MCHC 31.5 12/06/2017 0312   RDW 15.6 (H) 12/06/2017 0312   LYMPHSABS 2,760 08/12/2017 1530   MONOABS 558 05/12/2017 1034   EOSABS 200 08/12/2017 1530   BASOSABS 37 08/12/2017 1530    BMET    Component Value Date/Time   NA 136 12/05/2017 0846   K 4.0 12/05/2017 0846   CL 105 12/05/2017 0846   CO2 23 12/05/2017 0846   GLUCOSE 134 (H) 12/05/2017 0846   BUN 12 12/05/2017 0846   CREATININE 0.83 12/05/2017 0846   CREATININE 1.24 (H) 08/12/2017 1530   CALCIUM 7.8 (L) 12/05/2017 0846   GFRNONAA >60 12/05/2017 0846   GFRNONAA 50 (L) 08/12/2017 1530   GFRAA >60 12/05/2017 0846   GFRAA 57 (L) 08/12/2017 1530    INR    Component Value Date/Time   INR 1.40 11/28/2017 1900     Intake/Output Summary (Last 24 hours) at 12/06/2017 0855 Last data filed at 12/06/2017 0809 Gross per 24 hour  Intake 1380 ml  Output 2800 ml  Net -1420 ml     Assessment/Plan:  52 y.o. female is s/p #1: Aortobifemoral bypass graft using a14 x 7 bifurcated Dacron graft #2: Reimplantation, inferior mesenteric artery #3: Redo bilateral common femoral artery exposure #4: Thrombectomy of bilateral  femoral below-knee popliteal artery bypass graft #5: Reimplantation of left femoral-popliteal bypass graft to the aortobifemoral graft #6: Revision of proximal portion of right femoral popliteal bypass graft using a interposition 6 mm propatent Gore-Tex graft #7: Left lower extremity angiogram #8: Intra-arterial administration of nitroglycerin #9: Left common femoral and profunda femoral endarterectomy #10: Aortic endarterectomy #11: Placement of bilateral femoral Provena wound vacs   8 Days Post-Op   Encouraged OOB and participation with therapy team; refusingn placement at this time Cardiology recommending stopping amiodarone Continue to monitor peripheral signals and tissue changes Possible d/c early next week  Dagoberto Ligas, PA-C [facility] [phone] 12/06/2017 8:55 AM   I agree with the above.  Continue to mobilize Will need to change heparin to PO prior to discharge  Wells [name]

## 2017-12-07 LAB — GLUCOSE, CAPILLARY
Glucose-Capillary: 172 mg/dL — ABNORMAL HIGH (ref 65–99)
Glucose-Capillary: 162 mg/dL — ABNORMAL HIGH (ref 65–99)
Glucose-Capillary: 206 mg/dL — ABNORMAL HIGH (ref 65–99)
Glucose-Capillary: 228 mg/dL — ABNORMAL HIGH (ref 65–99)

## 2017-12-07 LAB — HEPARIN LEVEL (UNFRACTIONATED): Heparin Unfractionated: 0.42 IU/mL (ref 0.30–0.70)

## 2017-12-07 MED ORDER — RIVAROXABAN 20 MG PO TABS: 20.0000 mg | ORAL_TABLET | Freq: Every day | ORAL | Status: DC

## 2017-12-07 MED ORDER — RIVAROXABAN 20 MG PO TABS
20.0000 mg | ORAL_TABLET | Freq: Every day | ORAL | Status: DC
  Administered 2017-12-07 – 2017-12-09 (×3): 20 mg via ORAL
  Filled 2017-12-07 (×3): qty 1

## 2017-12-07 NOTE — Progress Notes (Addendum)
  Progress Note    12/07/2017 8:24 AM 9 Days Post-Op  Subjective:  Persistent diarrhea is keeping patient from increased activity   Vitals:   12/06/17 2033 12/07/17 0009  BP: (!) 136/92 116/66  Pulse: (!) 105 80  Resp: 13 14  Temp: 98.2 F (36.8 C) 98.9 F (37.2 C)  SpO2: 98% 94%   Physical Exam: Lungs:  Non labored Incisions:  B groin wound vacs left in place; BLE incisions stable, intact Extremities:  Strong AT and PT by doppler BLE; L 4, 5th toes dusky, R foot blister and lateral malleolus ulceration stable Abdomen:  Incision healing well with wound edges intact Neurologic: A&O  CBC    Component Value Date/Time   WBC 16.5 (H) 12/06/2017 0312   RBC 3.12 (L) 12/06/2017 0312   HGB 8.6 (L) 12/06/2017 0312   HCT 27.3 (L) 12/06/2017 0312   PLT 321 12/06/2017 0312   MCV 87.5 12/06/2017 0312   MCH 27.6 12/06/2017 0312   MCHC 31.5 12/06/2017 0312   RDW 15.6 (H) 12/06/2017 0312   LYMPHSABS 2,760 08/12/2017 1530   MONOABS 558 05/12/2017 1034   EOSABS 200 08/12/2017 1530   BASOSABS 37 08/12/2017 1530    BMET    Component Value Date/Time   NA 136 12/05/2017 0846   K 4.0 12/05/2017 0846   CL 105 12/05/2017 0846   CO2 23 12/05/2017 0846   GLUCOSE 134 (H) 12/05/2017 0846   BUN 12 12/05/2017 0846   CREATININE 0.83 12/05/2017 0846   CREATININE 1.24 (H) 08/12/2017 1530   CALCIUM 7.8 (L) 12/05/2017 0846   GFRNONAA >60 12/05/2017 0846   GFRNONAA 50 (L) 08/12/2017 1530   GFRAA >60 12/05/2017 0846   GFRAA 57 (L) 08/12/2017 1530    INR    Component Value Date/Time   INR 1.40 11/28/2017 1900     Intake/Output Summary (Last 24 hours) at 12/07/2017 0824 Last data filed at 12/07/2017 0640 Gross per 24 hour  Intake 440.13 ml  Output 3600 ml  Net -3159.87 ml     Assessment/Plan:  52 y.o. female is s/p #1: Aortobifemoral bypass graft using a14 x 7 bifurcated Dacron graft #2: Reimplantation, inferior mesenteric  artery #3: Redo bilateral common femoral artery exposure #4: Thrombectomy of bilateral femoral below-knee popliteal artery bypass graft #5: Reimplantation of left femoral-popliteal bypass graft to the aortobifemoral graft #6: Revision of proximal portion of right femoral popliteal bypass graft using a interposition 6 mm propatent Gore-Tex graft #7: Left lower extremity angiogram #8: Intra-arterial administration of nitroglycerin #9: Left common femoral and profunda femoral endarterectomy #10: Aortic endarterectomy #11: Placement of bilateral femoral Provena wound vacs   9 Days Post-Op   Check stool cx and C diff panel Check prealbumin Pharmacy to transition from heparin to Xarelto D/c when diarrhea controlled and activity improved   DVT prophylaxis:  As above   Dagoberto Ligas, PA-C [facility] [phone]  Agree with the above.  Needs to be more active prior to discharge.  Excellent pedal doppler signals bilaterally.  Incisions all well healed.  Incisional vacs in place. C/o diarrhea.  Will check CDIFF and stool cultures before adding more anti-diarrheal meds.  Stop heparin and start [name].  Possible d/c in 2-3 days   Wells Brabham 12/07/2017 8:24 AM

## 2017-12-07 NOTE — Discharge Instructions (Addendum)
[facility]  Discharge Instructions   Open Aortic Surgery  Please refer to the following instructions for your post-procedure care. Your surgeon or Physician Assistant will discuss any changes with you.  Activity  Avoid lifting more than eight pounds (a gallon of milk) until after your first post-operative visit. You are encouraged to walk as much as you can. You can slowly return to normal activities but must avoid strenuous activity and heavy lifting until your doctor tells you it's OK. Heavy lifting can hurt the incision and cause a hernia. Avoid activities such as vacuuming or swinging a golf club. It is normal to feel tired for several weeks after your surgery. Do not drive until your doctor gives the OK and you are no longer taking prescription pain medications. It is also normal to have difficulty with sleep habits, eating and bowl movements after surgery. These will go away with time.  Bathing/Showering  You may shower after you go home. Do not soak in a bathtub, hot tub, or swim until the incision heals.  Incision Care  Shower every day. Clean your incision with mild soap and water. Pat the area dry with a clean towel. You do not need a bandage unless otherwise instructed. Do not apply any ointments or creams to your incision. You may have skin glue on your incision. Do not peel it off. It will come off on its own in about one week. If you have staples or sutures along your incision, they will be removed at your post op appointment.  Diet  Resume your normal diet. There are no special food restriction following this procedure. A low fat/low cholesterol diet is recommended for all patients with vascular disease. After your aortic surgery, it's normal to feel full faster than usual and to not feel as hungry as you normally would. You will probably lose weight initially following your surgery. It's best to eat small, frequent meals over the course of  the day. Call the office if you find that you are unable to eat even small meals. In order to heal from your surgery, it is CRITICAL to get adequate nutrition. Your body requires vitamins, minerals, and protein. Vegetables are the best source of vitamins and minerals. causing pain, you may take over-the-counter pain reliever such as acetaminophen (Tylenol). If you were prescribed a stronger pain medication, please be aware these medication can cause nausea and constipation. Prevent nausea by taking the medication with a snack or meal. Avoid constipation by drinking plenty of fluids and eating foods with a high amount of fiber, such as fruits, vegetables and grains. Take 100mg  of the over-the-counter stool softener Colace twice a day as needed to help with constipation. A laxative, such as Milk of Magnesia, may be recommended for you at this time. Do not take a laxative unless your surgeon or P.A. tells you it's OK. Do not take Tylenol if you are taking stronger pain medications (such as Percocet).  Follow Up  Our office will schedule a follow up appointment 2-3 weeks after discharge.  Please call us immediately for any of the following conditions    .     Severe or worsening pain in your legs or feet or in your abdomen back or chest. Increased pain, redness drainage (pus) from your incision site. Increased abdominal pain, bloating, nausea, vomiting, or persistent diarrhea. Fever of 101 degrees or higher. Swelling in your leg (s).  Reduce your risk of vascular disease  Stop smoking.  If you would like help, call [facility] at 1-800-QUIT-NOW (269)280-2645) or [place] at 2142962291. Manage your cholesterol Maintain a desired weight Control your diabetes Keep your blood pressure down  If you have any questions please call the office at [phone]. Information on my medicine - XARELTO (Rivaroxaban)  This medication education was reviewed with me or my healthcare representative as part  of my discharge preparation.  Why was Xarelto prescribed for you? Xarelto was prescribed for you to reduce the risk of a blood clot forming that can cause a stroke if you have a medical condition called atrial fibrillation (a type of irregular heartbeat).  What do you need to know about xarelto ? Take your Xarelto ONCE DAILY at the same time every day with your evening meal. If you have difficulty swallowing the tablet whole, you may crush it and mix in applesauce just prior to taking your dose.  Take Xarelto exactly as prescribed by your doctor and DO NOT stop taking Xarelto without talking to the doctor who prescribed the medication.  Stopping without other stroke prevention medication to take the place of Xarelto may increase your risk of developing a clot that causes a stroke.  Refill your prescription before you run out.  After discharge, you should have regular check-up appointments with your healthcare provider that is prescribing your Xarelto.  In the future your dose may need to be changed if your kidney function or weight changes by a significant amount.  What do you do if you miss a dose? If you are taking Xarelto ONCE DAILY and you miss a dose, take it as soon as you remember on the same day then continue your regularly scheduled once daily regimen the next day. Do not take two doses of Xarelto at the same time or on the same day.   Important Safety Information A possible side effect of Xarelto is bleeding. You should call your healthcare provider right away if you experience any of the following: ? Bleeding from an injury or your nose that does not stop. ? Unusual colored urine (red or dark brown) or unusual colored stools (red or black). ? Unusual bruising for unknown reasons. ? A serious fall or if you hit your head (even if there is no bleeding).  Some medicines may interact with Xarelto and might increase your risk of bleeding while on Xarelto. To help avoid this,  consult your healthcare provider or pharmacist prior to using any new prescription or non-prescription medications, including herbals, vitamins, non-steroidal anti-inflammatory drugs (NSAIDs) and supplements.  This website has more information on Xarelto: [email or web address]

## 2017-12-07 NOTE — Progress Notes (Signed)
Progress Note  Patient Name: [name] Date of Encounter: 12/07/2017  Primary Cardiologist: Nelva Bush, MD   Subjective  Still with diarrhea    Inpatient Medications    Scheduled Meds: . ALPRAZolam  0.5 mg Oral Once  . Chlorhexidine Gluconate Cloth  6 each Topical Daily  . docusate sodium  100 mg Oral Daily  . furosemide  40 mg Intravenous BID  . insulin aspart  0-9 Units Subcutaneous TID WC  . mouth rinse  15 mL Mouth Rinse BID  . pantoprazole  40 mg Oral Daily  . pregabalin  100 mg Oral TID  . rivaroxaban  20 mg Oral Q supper  . rosuvastatin  20 mg Oral q1800  . sodium chloride flush  10-40 mL Intracatheter Q12H   Continuous Infusions: . heparin 1,750 Units/hr (12/07/17 0311)   PRN Meds: acetaminophen **OR** acetaminophen, alum & mag hydroxide-simeth, bisacodyl, dicyclomine, diphenoxylate-atropine, guaiFENesin-dextromethorphan, HYDROmorphone (DILAUDID) injection, ondansetron, ondansetron, oxyCODONE-acetaminophen, phenol, sodium chloride flush   Vital Signs    Vitals:   12/06/17 1200 12/06/17 1717 12/06/17 2033 12/07/17 0009  BP: 139/76 (!) 97/42 (!) 136/92 116/66  Pulse: 93 96 (!) 105 80  Resp: (!) 22 16 13 14   Temp:  98.2 F (36.8 C) 98.2 F (36.8 C) 98.9 F (37.2 C)  TempSrc: Oral Oral Oral Oral  SpO2: 99% 99% 98% 94%  Weight:      Height:        Intake/Output Summary (Last 24 hours) at 12/07/2017 1108 Last data filed at 12/07/2017 0640 Gross per 24 hour  Intake 440.13 ml  Output 3300 ml  Net -2859.87 ml   Filed Weights   11/28/17 0354 12/02/17 0500 12/06/17 0325  Weight: 166 lb 1.6 oz (75.3 kg) 193 lb 9 oz (87.8 kg) 204 lb 12.9 oz (92.9 kg)    Telemetry    SR no PAF 12/07/2017   Personally Reviewed  ECG   SR PVC RBBB 12/02/17   Physical Exam   Chronically ill  HEENT: normal Neck supple with no adenopathy JVP normal no bruits no thyromegaly Lungs clear with no wheezing and good diaphragmatic motion Heart:  S1/S2 no murmur, no  rub, gallop or click PMI normal Abdomen: post vascular surgery with large midline scar wound vac both iliac regions Edema to thighs bilaterally Neuro non-focal Skin warm and dry No muscular weakness   Labs    Chemistry Recent Labs  Lab 12/03/17 0350 12/04/17 0411 12/05/17 0846  NA 135 135 136  K 3.8 4.0 4.0  CL 107 107 105  CO2 20* 23 23  GLUCOSE 98 140* 134*  BUN 10 10 12   CREATININE 0.66 0.59 0.83  CALCIUM 8.1* 7.7* 7.8*  PROT 4.3*  --  4.4*  ALBUMIN 1.5*  --  1.6*  AST 19  --  13*  ALT 13*  --  10*  ALKPHOS 128*  --  90  BILITOT 0.7  --  0.3  GFRNONAA >60 >60 >60  GFRAA >60 >60 >60  ANIONGAP 8 5 8      Hematology Recent Labs  Lab 12/04/17 0411 12/05/17 0846 12/06/17 0312  WBC 13.5* 14.7* 16.5*  RBC 3.18* 3.12* 3.12*  HGB 8.8* 8.8* 8.6*  HCT 27.5* 27.5* 27.3*  MCV 86.5 88.1 87.5  MCH 27.7 28.2 27.6  MCHC 32.0 32.0 31.5  RDW 15.1 15.7* 15.6*  PLT 261 292 321    Cardiac Enzymes Recent Labs  Lab 12/02/17 0330 12/02/17 0930  TROPONINI 0.14* <0.03   No results  for input(s): TROPIPOC in the last 168 hours.    Radiology    No results found.  Cardiac Studies    Patient Profile     52 y.o. female  Hx of HTN, HL, DM, PAD  Admitted 1/7 with acute occlusion of L fem pop  Seen by cardiology preop   Asked to reassess postop for AFib with RVR  Assessment & Plan    1  Post op Atrial fib  Has remained in SR transitioned to xarelto from heparin   2  Edema:  Has significant volume overload both legs / thighs and lower abdomen EF normal by echo 04/17/17 Good diuresis on bid lasix yesterday   3  HL  Not on a statin  Would start  Follow as outpt    4. Diarrhea: on max dose lomotil C diff sent plan per primary service       Signed, Jenkins Rouge, MD  12/07/2017, 11:08 AM

## 2017-12-07 NOTE — Progress Notes (Signed)
ANTICOAGULATION CONSULT NOTE - Follow Up Consult  Pharmacy Consult for Heparin > Xarelto Indication: occluded fem-pop bypass, afib  No Known Allergies  Patient Measurements: Height: 5\' 7"  (170.2 cm) Weight: 204 lb 12.9 oz (92.9 kg) IBW/kg (Calculated) : 61.6 Heparin Dosing Weight: 73.5 kg  Vital Signs: Temp: 98.9 F (37.2 C) (01/20 0009) Temp Source: Oral (01/20 0009) BP: 116/66 (01/20 0009) Pulse Rate: 80 (01/20 0009)  Labs: Recent Labs    12/05/17 0846 12/06/17 0312 12/07/17 0252  HGB 8.8* 8.6*  --   HCT 27.5* 27.3*  --   PLT 292 321  --   HEPARINUNFRC 0.58 0.58 0.42  CREATININE 0.83  --   --     Estimated Creatinine Clearance: 91.7 mL/min (by C-G formula based on SCr of 0.83 mg/dL).   Medications:  Scheduled:  . ALPRAZolam  0.5 mg Oral Once  . Chlorhexidine Gluconate Cloth  6 each Topical Daily  . docusate sodium  100 mg Oral Daily  . furosemide  40 mg Intravenous BID  . insulin aspart  0-9 Units Subcutaneous TID WC  . mouth rinse  15 mL Mouth Rinse BID  . pantoprazole  40 mg Oral Daily  . pregabalin  100 mg Oral TID  . rivaroxaban  20 mg Oral Q supper  . rosuvastatin  20 mg Oral q1800  . sodium chloride flush  10-40 mL Intracatheter Q12H   Infusions:  . 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 Went back to OR 1/11 for redo fem-pop. Continuing heparin due to afib and PAD.  Pharmacy consulted to transition from heparin to Xarelto. Communicated with RN to turn off heparin drip at time of first dose of Xarelto. No bleed reported. CBC stable.  Goal of Therapy:  Stroke prevention, PAD treatment Monitor platelets by anticoagulation protocol: Yes   Plan:  D/c heparin drip at time of 1st dose of Xarelto 20mg  PO Qsupper Monitor CBC, s/sx bleeding  Elicia Lamp, PharmD, BCPS Clinical Pharmacist Clinical phone for 12/07/2017 until 3:30pm: [phone] If after  3:30pm, please call main pharmacy at: [phone] 12/07/2017 12:06 PM

## 2017-12-08 ENCOUNTER — Telehealth: Payer: Self-pay | Admitting: Cardiology

## 2017-12-08 LAB — BASIC METABOLIC PANEL
Anion gap: 10 (ref 5–15)
BUN: 11 mg/dL (ref 6–20)
CALCIUM: 7.9 mg/dL — AB (ref 8.9–10.3)
CO2: 25 mmol/L (ref 22–32)
CREATININE: 0.76 mg/dL (ref 0.44–1.00)
Chloride: 99 mmol/L — ABNORMAL LOW (ref 101–111)
GFR calc Af Amer: >60 mL/min (ref >60)
Glucose, Bld: 276 mg/dL — ABNORMAL HIGH (ref 65–99)
POTASSIUM: 3.9 mmol/L (ref 3.5–5.1)
SODIUM: 134 mmol/L — AB (ref 135–145)

## 2017-12-08 LAB — GLUCOSE, CAPILLARY
GLUCOSE-CAPILLARY: 207 mg/dL — AB (ref 65–99)
GLUCOSE-CAPILLARY: 266 mg/dL — AB (ref 65–99)
Glucose-Capillary: 287 mg/dL — ABNORMAL HIGH (ref 65–99)
Glucose-Capillary: 213 mg/dL — ABNORMAL HIGH (ref 65–99)

## 2017-12-08 LAB — C DIFFICILE QUICK SCREEN W PCR REFLEX
C Diff antigen: NEGATIVE
C Diff interpretation: NOT DETECTED
C Diff toxin: NEGATIVE

## 2017-12-08 LAB — PREALBUMIN: PREALBUMIN: 8.7 mg/dL — AB (ref 18–38)

## 2017-12-08 MED ORDER — PRO-STAT SUGAR FREE PO LIQD
30.0000 mL | Freq: Two times a day (BID) | ORAL | Status: DC
  Administered 2017-12-08 – 2017-12-09 (×3): 30 mL via ORAL
  Filled 2017-12-08 (×2): qty 30

## 2017-12-08 NOTE — Care Management Important Message (Signed)
Important Message  Patient Details  Name: [name] MRN: [id number] Date of Birth: 11/21/1965   Medicare Important Message Given:  Yes    Dawayne Patricia, RN 12/08/2017, 11:32 AM

## 2017-12-08 NOTE — Progress Notes (Signed)
Progress Note  Patient Name: [name] Date of Encounter: 12/08/2017  Primary Cardiologist:   Nelva Bush, MD   Subjective   No chest pain.  Leg pain improved.  She has back pain.   Inpatient Medications    Scheduled Meds: . ALPRAZolam  0.5 mg Oral Once  . Chlorhexidine Gluconate Cloth  6 each Topical Daily  . docusate sodium  100 mg Oral Daily  . furosemide  40 mg Intravenous BID  . insulin aspart  0-9 Units Subcutaneous TID WC  . mouth rinse  15 mL Mouth Rinse BID  . pantoprazole  40 mg Oral Daily  . pregabalin  100 mg Oral TID  . rivaroxaban  20 mg Oral Q supper  . rosuvastatin  20 mg Oral q1800  . sodium chloride flush  10-40 mL Intracatheter Q12H   Continuous Infusions:  PRN Meds: acetaminophen **OR** acetaminophen, alum & mag hydroxide-simeth, bisacodyl, dicyclomine, diphenoxylate-atropine, guaiFENesin-dextromethorphan, HYDROmorphone (DILAUDID) injection, ondansetron, ondansetron, oxyCODONE-acetaminophen, phenol, sodium chloride flush   Vital Signs    Vitals:   12/07/17 1942 12/07/17 2349 12/08/17 0423 12/08/17 0744  BP: 116/74 114/65 126/65 (!) 104/57  Pulse: 84 83 77 86  Resp: 13 11 14 14   Temp: 98.5 F (36.9 C) 98.1 F (36.7 C) 98.3 F (36.8 C) 98.3 F (36.8 C)  TempSrc: Oral Oral Oral Oral  SpO2: 97% 97% 97% 99%  Weight:      Height:        Intake/Output Summary (Last 24 hours) at 12/08/2017 0954 Last data filed at 12/08/2017 0934 Gross per 24 hour  Intake 720 ml  Output 2600 ml  Net -1880 ml   Filed Weights   11/28/17 0354 12/02/17 0500 12/06/17 0325  Weight: 166 lb 1.6 oz (75.3 kg) 193 lb 9 oz (87.8 kg) 204 lb 12.9 oz (92.9 kg)    Telemetry    NSR, PVCs - Personally Reviewed  ECG    NA - Personally Reviewed  Physical Exam   GEN: No acute distress.   Neck: No  JVD Cardiac: RRR, no murmurs, rubs, or gallops.  Respiratory: Clear  to auscultation bilaterally. GI: Soft, nontender, non-distended  MS:  Moderate left greater  than right edema; No deformity. Neuro:  Nonfocal  Psych: Normal affect   Labs    Chemistry Recent Labs  Lab 12/03/17 0350 12/04/17 0411 12/05/17 0846  NA 135 135 136  K 3.8 4.0 4.0  CL 107 107 105  CO2 20* 23 23  GLUCOSE 98 140* 134*  BUN 10 10 12   CREATININE 0.66 0.59 0.83  CALCIUM 8.1* 7.7* 7.8*  PROT 4.3*  --  4.4*  ALBUMIN 1.5*  --  1.6*  AST 19  --  13*  ALT 13*  --  10*  ALKPHOS 128*  --  90  BILITOT 0.7  --  0.3  GFRNONAA >60 >60 >60  GFRAA >60 >60 >60  ANIONGAP 8 5 8      Hematology Recent Labs  Lab 12/04/17 0411 12/05/17 0846 12/06/17 0312  WBC 13.5* 14.7* 16.5*  RBC 3.18* 3.12* 3.12*  HGB 8.8* 8.8* 8.6*  HCT 27.5* 27.5* 27.3*  MCV 86.5 88.1 87.5  MCH 27.7 28.2 27.6  MCHC 32.0 32.0 31.5  RDW 15.1 15.7* 15.6*  PLT 261 292 321    Cardiac Enzymes Recent Labs  Lab 12/02/17 0330 12/02/17 0930  TROPONINI 0.14* <0.03   No results for input(s): TROPIPOC in the last 168 hours.   BNPNo results for input(s): BNP, PROBNP  in the last 168 hours.   DDimer No results for input(s): DDIMER in the last 168 hours.   Radiology    No results found.  Cardiac Studies   Echo 04/17/17  Study Conclusions  - Technically difficult study due to chest wall and/or lung   interference. - Left ventricle: The cavity size was normal. Wall thickness was   increased in a pattern of mild LVH. Systolic function was normal.   The estimated ejection fraction was in the range of 55% to 60%.   The study is not technically sufficient to allow evaluation of LV   diastolic function. - Mitral valve: Calcified annulus. Mildly thickened leaflets . - Right ventricle: The cavity size was normal. Systolic function   was normal. - Pericardium, extracardiac: A trivial pericardial effusion was   identified.    Patient Profile     52 y.o. female Hx of HTN, HL, DM, PAD  Admitted 1/7 with acute occlusion of L fem pop  Seen by cardiology preop   Asked to reassess postop for AFib  with RVR  Assessment & Plan    ATRIAL FIB:   Ms. [name] has a CHA2DS2 - VASc score of 4. However, he was in this rhythm for only 24 hours post surgery.    Arrangements have been made for a thirty day event monitor as an outpatient.  On Xarelto for PVD but would not need this long term if no atrial fib on out patient monitor.  She will follow with Dr. Saunders Revel.     EDEMA:  Down 1200 cc yesterday.   Continue IV Lasix in hospital.  I would suggest sending her home with PO Lasix at least for the short term.    I will order a BMET for today.  Follow this per primary team.    DYSLIPIDEMIA:   Continue current meds.   We will sign off.  Please call with questions.    For questions or updates, please contact [place] Please consult [email or web address] for contact info under Cardiology/STEMI.   Signed, Minus Breeding, MD  12/08/2017, 9:54 AM

## 2017-12-08 NOTE — Progress Notes (Signed)
Physical Therapy Treatment Patient Details Name: [name] MRN: [id number] DOB: 1966/11/09 Today's Date: 12/08/2017    History of Present Illness Pt is a 52 y.o. 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 On 11/28/17 she underwent aortobifemoral bypass grafting, revision of R graft, and embolectomy of L graft.  Pt had to return to sx for left embolectomy and left popliteal/tibioperoneal trunk endarterectomy and patch angioplasty with wound vac placement. PMH: asthma, depression, DM.    PT Comments    Pt performed increased activity during session and progressing well.  Pt reports she has support from family members at home and will have adequate assistance to return home.  Based on improvement during today's session, will inform supervising PT of need for update in recommendations to HHPT with 24 hour assistance and supervision.  If patient does not have adequate support at home she will require SNF.  At this time recommending home with HHPT and support from family.      Follow Up Recommendations  Home health PT;Supervision/Assistance - 24 hour     Equipment Recommendations  None recommended by PT    Recommendations for Other Services       Precautions / Restrictions Precautions Precautions: Fall Precaution Comments: wound vac Restrictions Weight Bearing Restrictions: No Other Position/Activity Restrictions: No MD orders. Of note, pt with large blister on sole of L foot, blister began to ooze during gait training.      Mobility  Bed Mobility Overal bed mobility: Needs Assistance Bed Mobility: Supine to Sit Rolling: Supervision Sidelying to sit: Min assist       General bed mobility comments: Pt required min assistance for trunk elevation, pt required cues for hand placement and positioning.   Transfers Overall transfer level: Needs assistance Equipment used: 4-wheeled  walker Transfers: Sit to/from Stand Sit to Stand: Min guard(from elevated surface of bed and BSC.  )         General transfer comment: Pt able to lock brakes on rollator with cueing.  Cues for hand placement to and from seated surface.  Pt able to stand with improved ease from elevated surfaces.    Ambulation/Gait Ambulation/Gait assistance: Min guard Ambulation Distance (Feet): 12 Feet(x2 trials) Assistive device: 4-wheeled walker Gait Pattern/deviations: Step-through pattern;Decreased stride length;Trunk flexed Gait velocity: Decreased Gait velocity interpretation: Below normal speed for age/gender General Gait Details: Pt flexed with posture as rollator appears too short but patient is set on height as it is from home.  Pt with clear drainage from L foot during gait training.   Pt fatigues quickly with gait.     Stairs            Wheelchair Mobility    Modified Rankin (Stroke Patients Only)       Balance Overall balance assessment: Needs assistance   Sitting balance-Leahy Scale: Fair       Standing balance-Leahy Scale: Poor                              Cognition Arousal/Alertness: Awake/alert Behavior During Therapy: Anxious Overall Cognitive Status: Within Functional Limits for tasks assessed                                        Exercises      General  Comments        Pertinent Vitals/Pain Pain Assessment: Faces Faces Pain Scale: Hurts little more Pain Location: abdomen, groin, back Pain Descriptors / Indicators: Grimacing;Guarding;Operative site guarding Pain Intervention(s): Monitored during session;Repositioned    Home Living                      Prior Function            PT Goals (current goals can now be found in the care plan section) Acute Rehab PT Goals Patient Stated Goal: to go home Potential to Achieve Goals: Good Progress towards PT goals: Progressing toward goals    Frequency    Min  3X/week      PT Plan Current plan remains appropriate    Co-evaluation              AM-PAC PT "6 Clicks" Daily Activity  Outcome Measure  Difficulty turning over in bed (including adjusting bedclothes, sheets and blankets)?: Unable Difficulty moving from lying on back to sitting on the side of the bed? : Unable Difficulty sitting down on and standing up from a chair with arms (e.g., wheelchair, bedside commode, etc,.)?: Unable Help needed moving to and from a bed to chair (including a wheelchair)?: A Little Help needed walking in hospital room?: A Lot Help needed climbing 3-5 steps with a railing? : A Lot 6 Click Score: 10    End of Session Equipment Utilized During Treatment: Gait belt Activity Tolerance: Patient limited by fatigue;Patient limited by pain Patient left: in chair;with call bell/phone within reach Nurse Communication: Mobility status PT Visit Diagnosis: Unsteadiness on feet (R26.81);Muscle weakness (generalized) (M62.81);Pain Pain - part of body: (abdomen, back)     Time: 1428-1500 PT Time Calculation (min) (ACUTE ONLY): 32 min  Charges:  $Gait Training: 8-22 mins $Therapeutic Activity: 8-22 mins                    G Codes:       Governor Rooks, PTA pager [phone]    Cristela Blue 12/08/2017, 3:16 PM

## 2017-12-08 NOTE — Telephone Encounter (Signed)
Error

## 2017-12-08 NOTE — Progress Notes (Signed)
Per insurance check on [place]- (Xarelto and Eliquis)  # 3. S/W ADRINA @ [facility] RX # [phone]   1. XARELTO 15 MG BID  COVER- YES  CO-PAY- $ 45.00  Q/L TWO PILL PER DAY  TIER- 3 DRUG  PRIOR APPROVAL- NO    2. ELIQUIS 5 MG BID  COVER- YES  CO-PAY- $ 45.00   Q/L ONE PILL PER DAY  TIER- 3 DRUG  PRIOR APPROVAL NO   PREFERRED PHARMACY : [facility]

## 2017-12-08 NOTE — Progress Notes (Signed)
  Progress Note    12/08/2017 7:24 AM 10 Days Post-Op  Subjective:  Says she feels better.  No more diarrhea since yesterday.  Afebrile HR 70's-90's NSR 338'S-505'L systolic 97% RA  Vitals:   12/07/17 2349 12/08/17 0423  BP: 114/65 126/65  Pulse: 83 77  Resp: 11 14  Temp: 98.1 F (36.7 C) 98.3 F (36.8 C)  SpO2: 97% 97%    Physical Exam: Cardiac:  regular Lungs:  Non labored Incisions:  Bilateral groin wounds with incisional vacs; left leg incisions are clean and dry with staples in tact.  Laparotomy incision is clean and dry and healing nicely. Extremities:  brisk right DP and brisk left PT > DP-left foot wounds stable Abdomen:  Soft, NT/ND  CBC    Component Value Date/Time   WBC 16.5 (H) 12/06/2017 0312   RBC 3.12 (L) 12/06/2017 0312   HGB 8.6 (L) 12/06/2017 0312   HCT 27.3 (L) 12/06/2017 0312   PLT 321 12/06/2017 0312   MCV 87.5 12/06/2017 0312   MCH 27.6 12/06/2017 0312   MCHC 31.5 12/06/2017 0312   RDW 15.6 (H) 12/06/2017 0312   LYMPHSABS 2,760 08/12/2017 1530   MONOABS 558 05/12/2017 1034   EOSABS 200 08/12/2017 1530   BASOSABS 37 08/12/2017 1530    BMET    Component Value Date/Time   NA 136 12/05/2017 0846   K 4.0 12/05/2017 0846   CL 105 12/05/2017 0846   CO2 23 12/05/2017 0846   GLUCOSE 134 (H) 12/05/2017 0846   BUN 12 12/05/2017 0846   CREATININE 0.83 12/05/2017 0846   CREATININE 1.24 (H) 08/12/2017 1530   CALCIUM 7.8 (L) 12/05/2017 0846   GFRNONAA >60 12/05/2017 0846   GFRNONAA 50 (L) 08/12/2017 1530   GFRAA >60 12/05/2017 0846   GFRAA 57 (L) 08/12/2017 1530    INR    Component Value Date/Time   INR 1.40 11/28/2017 1900     Intake/Output Summary (Last 24 hours) at 12/08/2017 0724 Last data filed at 12/08/2017 0425 Gross per 24 hour  Intake 600 ml  Output 2600 ml  Net -2000 ml     Assessment:  52 y.o. female is s/p:  1: Aortobifemoral bypass graft using a14 x 7 bifurcated Dacron graft #2: Reimplantation, inferior mesenteric  artery #3: Redo bilateral common femoral artery exposure #4: Thrombectomy of bilateral femoral below-knee popliteal artery bypass graft #5: Reimplantation of left femoral-popliteal bypass graft to the aortobifemoral graft #6: Revision of proximal portion of right femoral popliteal bypass graft using a interposition 6 mm propatent Gore-Tex graft #7: Left lower extremity angiogram #8: Intra-arterial administration of nitroglycerin #9: Left common femoral and profunda femoral endarterectomy #10: Aortic endarterectomy #11: Placement of bilateral femoral Provena wound vacs    10 Days Post-Op  Plan: -pt with brisk right DP and brisk left PT > DP -left foot wounds stable -no further diarrhea since yesterday -leukocytosis a couple of days ago-pt remains afebrile.  Await C diff results.  Check CBC tomorrow. -malnourished-will get nutrition consult -leg incisions are healing nicely with staples in tact.  Continue incisional vacs to groin  -DVT prophylaxis:  Xarelto started yesterday. -cards recommending statin-will wait until abdominal issues are improved.   Leontine Locket, PA-C [facility] [phone] 12/08/2017 7:24 AM

## 2017-12-08 NOTE — Progress Notes (Signed)
Initial Nutrition Assessment  DOCUMENTATION CODES:   Obesity unspecified  INTERVENTION:    Prostat liquid protein po 30 ml BID with meals, each supplement provides 100 kcal, 15 grams protein  NUTRITION DIAGNOSIS:   Increased nutrient needs related to wound healing as evidenced by estimated needs   GOAL:   Patient will meet greater than or equal to 90% of their needs  MONITOR:   PO intake, Supplement acceptance, Labs, Skin, Weight trends  REASON FOR ASSESSMENT:   Consult Assessment of nutrition requirement/status, Wound healing  ASSESSMENT:   52 yo Female admitted with left lower extremity pain with concern for occlusion of left femoral-popliteal bypass graft. 1/11 underwent exploration of left fem-pop bypass graft, embolectomy, and revision.   Upon RD room entry, pt continuously stating she has to "pee". Moved bedside commode to her. May need help getting up. RN notified. PO intake good at 100% per flowsheet records. Per readings below weight has fluctuated.  Noted she was having some diarrhea, however, this has improved. Medications include Lomotil, Lasix, Protonix and Colace. Labs reviewed. Na 134 (L). PAB 8.7 (L). CBG's 228-207-266.  Pt is not meeting estimated protein needs with current diet order. Will add liquid protein supplement.  NUTRITION - FOCUSED PHYSICAL EXAM:  Unable to assess at this time, however, appeared well nourished.  Diet Order:  Diet heart healthy/carb modified Room service appropriate? Yes; Fluid consistency: Thin  EDUCATION NEEDS:   No education needs have been identified at this time  Skin:  Skin Assessment: Skin Integrity Issues: Skin Integrity Issues:: Wound VAC Wound Vac: bilateral femoral   Last BM:  1/19  Height:   Ht Readings from Last 1 Encounters:  11/28/17 5\' 7"  (1.702 m)   Weight:   Wt Readings from Last 1 Encounters:  12/06/17 204 lb 12.9 oz (92.9 kg)   Wt Readings from Last 10 Encounters:  12/06/17 204 lb  12.9 oz (92.9 kg)  11/24/17 158 lb (71.7 kg)  11/19/17 158 lb (71.7 kg)  10/31/17 165 lb (74.8 kg)  10/06/17 160 lb (72.6 kg)  09/04/17 168 lb (76.2 kg)  08/12/17 161 lb (73 kg)  08/11/17 159 lb (72.1 kg)  06/30/17 149 lb (67.6 kg)  06/25/17 150 lb 4 oz (68.2 kg)   Ideal Body Weight:  61.3 kg  BMI:  Body mass index is 32.08 kg/m.  Estimated Nutritional Needs:   Kcal:  1800-2000  Protein:  115-130 gm  Fluid:  1.8-2.0 L  Arthur Holms, RD, LDN Pager #: [phone] After-Hours Pager #: [phone]

## 2017-12-09 LAB — CBC
HCT: 26.2 % — ABNORMAL LOW (ref 36.0–46.0)
HEMOGLOBIN: 8.4 g/dL — AB (ref 12.0–15.0)
MCH: 27.9 pg (ref 26.0–34.0)
MCHC: 32.1 g/dL (ref 30.0–36.0)
MCV: 87 fL (ref 78.0–100.0)
Platelets: 262 10*3/uL (ref 150–400)
RBC: 3.01 MIL/uL — ABNORMAL LOW (ref 3.87–5.11)
RDW: 15.2 % (ref 11.5–15.5)
WBC: 15.2 10*3/uL — AB (ref 4.0–10.5)

## 2017-12-09 LAB — GLUCOSE, CAPILLARY
GLUCOSE-CAPILLARY: 264 mg/dL — AB (ref 65–99)
GLUCOSE-CAPILLARY: 271 mg/dL — AB (ref 65–99)
Glucose-Capillary: 276 mg/dL — ABNORMAL HIGH (ref 65–99)
Glucose-Capillary: 229 mg/dL — ABNORMAL HIGH (ref 65–99)

## 2017-12-09 MED ORDER — RIVAROXABAN 20 MG PO TABS: 20.0000 mg | ORAL_TABLET | Freq: Every day | ORAL | 2 refills | Status: DC

## 2017-12-09 MED ORDER — OXYCODONE-ACETAMINOPHEN 5-325 MG PO TABS: 1.0000 | ORAL_TABLET | ORAL | 0 refills | Status: DC | PRN

## 2017-12-09 MED ORDER — ROSUVASTATIN CALCIUM 20 MG PO TABS: 20.0000 mg | ORAL_TABLET | Freq: Every day | ORAL | 2 refills | Status: DC

## 2017-12-09 MED ORDER — SODIUM CHLORIDE 0.9 % IV SOLN
INTRAVENOUS | Status: DC
  Administered 2017-12-09: 09:00:00 via INTRAVENOUS

## 2017-12-09 NOTE — Progress Notes (Signed)
[facility] [place]  Subjective  - She really wants to go.   Objective (!) 78/47 77 98.2 F (36.8 C) (Oral) 18 95%  Intake/Output Summary (Last 24 hours) at 12/09/2017 0706 Last data filed at 12/08/2017 2305 Gross per 24 hour  Intake 260 ml  Output 2200 ml  Net -1940 ml    Doppler DP/PT/Peroneal B signals Wound incisional vacs B groin  Abdominal incision healing well Left leg incisions healing well staples intact Left foot with 2 cm black eschar residual blister lateral foot  and fifth toe ischemic changes, much improvement over all in the left foot from post op day 1 Heart RRR Lungs non labored breathing  Assessment/Planning: 52 y.o. female is s/p:  1: Aortobifemoral bypass graft using a14 x 7 bifurcated Dacron graft #2: Reimplantation, inferior mesenteric artery #3: Redo bilateral common femoral artery exposure #4: Thrombectomy of bilateral femoral below-knee popliteal artery bypass graft #5: Reimplantation of left femoral-popliteal bypass graft to the aortobifemoral graft #6: Revision of proximal portion of right femoral popliteal bypass graft using a interposition 6 mm propatent Gore-Tex graft #7: Left lower extremity angiogram #8: Intra-arterial administration of nitroglycerin #9: Left common femoral and profunda femoral endarterectomy #10: Aortic endarterectomy #11: Placement of bilateral femoral Provena wound vacs  Post op day 11  C diff negative study No loose BM in 48 hours now Xarelto started paroxysmal A fib Continue to let left foot fifth toe and lateral wound to demarcate with time. Leukocytosis 15.2, Afebrile Will start Lipitor today Plan discharge with [facility]. Will have her follow up in 2 weeks Wound vac change every 7 days 12/12/2017 will be next wound vac change with [facility] RN.     [name] 12/09/2017 7:06 AM --  Laboratory Lab Results: Recent Labs    12/09/17 0502  WBC 15.2*   HGB 8.4*  HCT 26.2*  PLT 262   BMET Recent Labs    12/08/17 1055  NA 134*  K 3.9  CL 99*  CO2 25  GLUCOSE 276*  BUN 11  CREATININE 0.76  CALCIUM 7.9*    COAG Lab Results  Component Value Date   INR 1.40 11/28/2017   INR 1.30 11/28/2017   INR 1.18 11/24/2017   No results found for: PTT

## 2017-12-09 NOTE — Progress Notes (Signed)
Patient is being home today with [facility] services provided by [place]; provana wound vac; Butch Penny with [facility] made aware; Mindi Slicker [facility] [phone]

## 2017-12-09 NOTE — Progress Notes (Signed)
Inpatient Diabetes Program Recommendations  AACE/ADA: New Consensus Statement on Inpatient Glycemic Control (2015)  Target Ranges:  Prepandial:   less than 140 mg/dL      Peak postprandial:   less than 180 mg/dL (1-2 hours)      Critically ill patients:  140 - 180 mg/dL   Lab Results  Component Value Date   GLUCAP 276 (H) 12/09/2017   HGBA1C 7.2 (H) 11/24/2017    Review of Glycemic Control  Results for DELMI, FULFER (MRN [id number]) as of 12/09/2017 08:25  Ref. Range 12/08/2017 06:04 12/08/2017 11:18 12/08/2017 16:27 12/08/2017 21:48 12/09/2017 05:52  Glucose-Capillary Latest Ref Range: 65 - 99 mg/dL 207 (H) 266 (H) 287 (H) 213 (H) 276 (H)   Outpatient DM meds:Lantus 36 units BID                                     Humalog 10 units tid  Current Insulin Orders:  Novolog Sensitive Correction Scale/ SSI (0-9 units) TID   Consider adding Lantus 10 units qam and Novolog 3 units tid with meals - continue Novolog correction as ordered.   Reminder- insulin must be given within 1 hour of the blood sugar being checked- if it is outside the 1 hour window, please re-check blood sugar before calculating the correction Novolog dose

## 2017-12-09 NOTE — Progress Notes (Signed)
Physical Therapy Treatment Patient Details Name: [name] MRN: [id number] DOB: [birth year] Today's Date: 12/09/2017    History of Present Illness Pt is a 52 y.o. 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 On 11/28/17 she underwent aortobifemoral bypass grafting, revision of R graft, and embolectomy of L graft.  Pt had to return to sx for left embolectomy and left popliteal/tibioperoneal trunk endarterectomy and patch angioplasty with wound vac placement. PMH: asthma, depression, DM.    PT Comments    Pt is making steady progress with mobility. Pt c/o pain in her left calf due to the vac and surgical site. Pt presents with Left foot drop strength 1/5. Educated pt on DF stretching and ankle exercises. Pt ambulated 70 ft with Supervision and RW. Agree with d/c plan and pt will continue to benefit from skilled PT to maximize mobility and Independence for return home with family support.  Follow Up Recommendations  Home health PT;Supervision/Assistance - 24 hour     Equipment Recommendations  None recommended by PT    Recommendations for Other Services       Precautions / Restrictions Precautions Precautions: Fall;Other (comment) Precaution Comments: wound vac Restrictions Weight Bearing Restrictions: No LLE Weight Bearing: Partial weight bearing    Mobility  Bed Mobility Overal bed mobility: Needs Assistance Bed Mobility: Sit to Supine       Sit to supine: Min assist   General bed mobility comments: assistance lifting bil LE into the bed, use of bed rails  Transfers Overall transfer level: Needs assistance Equipment used: 4-wheeled walker Transfers: Sit to/from Stand Sit to Stand: Supervision Stand pivot transfers: Supervision       General transfer comment: pt supervision for balance with standing while holding onto bed rail for cleaning from using the BSC, Pt spervsison  with stand pivot transfers from [facility] to bed.  Ambulation/Gait Ambulation/Gait assistance: Supervision Ambulation Distance (Feet): 45 Feet Assistive device: 4-wheeled walker Gait Pattern/deviations: Step-through pattern;Decreased stride length;Decreased dorsiflexion - left Gait velocity: Decreased   General Gait Details: left foot drop noted with gait   Stairs            Wheelchair Mobility    Modified Rankin (Stroke Patients Only)       Balance Overall balance assessment: Needs assistance Sitting-balance support: No upper extremity supported Sitting balance-Leahy Scale: Good     Standing balance support: Bilateral upper extremity supported Standing balance-Leahy Scale: Fair                              Cognition Arousal/Alertness: Awake/alert Behavior During Therapy: WFL for tasks assessed/performed Overall Cognitive Status: Within Functional Limits for tasks assessed                                        Exercises Total Joint Exercises Ankle Circles/Pumps: AROM;AAROM;Strengthening;Both;Supine;20 reps Quad Sets: AROM;Strengthening;Both;Supine;15 reps Hip ABduction/ADduction: AAROM;Strengthening;Left;Supine;15 reps Other Exercises Other Exercises: left ankle DF sheet self stretch 10 reps holding 10 seconds, supine Other Exercises: Therapist AAROM DFx10 Left LE    General Comments        Pertinent Vitals/Pain Pain Assessment: 0-10 Pain Score: 9  Pain Location: left leg Pain Descriptors / Indicators: Discomfort Pain Intervention(s): Limited activity within patient's tolerance;Repositioned;Monitored during session;Patient requesting pain meds-RN notified  Home Living                      Prior Function            PT Goals (current goals can now be found in the care plan section) Progress towards PT goals: Progressing toward goals    Frequency    Min 3X/week      PT Plan Current plan remains appropriate     Co-evaluation              AM-PAC PT "6 Clicks" Daily Activity  Outcome Measure  Difficulty turning over in bed (including adjusting bedclothes, sheets and blankets)?: A Lot Difficulty moving from lying on back to sitting on the side of the bed? : A Lot Difficulty sitting down on and standing up from a chair with arms (e.g., wheelchair, bedside commode, etc,.)?: A Little Help needed moving to and from a bed to chair (including a wheelchair)?: A Little Help needed walking in hospital room?: A Little Help needed climbing 3-5 steps with a railing? : Total 6 Click Score: 14    End of Session Equipment Utilized During Treatment: Gait belt Activity Tolerance: Patient tolerated treatment well Patient left: in bed;with call bell/phone within reach Nurse Communication: Mobility status PT Visit Diagnosis: Unsteadiness on feet (R26.81);Muscle weakness (generalized) (M62.81);Pain     Time: 5072-2575 PT Time Calculation (min) (ACUTE ONLY): 42 min  Charges:  $Gait Training: 8-22 mins $Therapeutic Exercise: 8-22 mins $Therapeutic Activity: 8-22 mins                    G Codes:       Theodoro Grist, PT   [name] 12/09/2017, 11:32 AM

## 2017-12-10 ENCOUNTER — Telehealth: Payer: Self-pay | Admitting: Surgery

## 2017-12-10 NOTE — Telephone Encounter (Signed)
Sched appt 12/22/17 at 1:45. Hm# not going through, lm on cell# to inform pt of appt.

## 2017-12-10 NOTE — Telephone Encounter (Signed)
-----   Message from Mena Goes, RN sent at 12/10/2017  9:52 AM EST ----- Regarding: 12-22-17 appt   ----- Message ----- From: Ulyses Amor, PA-C Sent: 12/10/2017   8:52 AM To: [facility] Charge Pool  F/U with Dr. Brabham Mon.  4th of Feb.  for wound check, staple removal left LE s/p aorto bifem and left fem-pop revision.

## 2017-12-10 NOTE — Discharge Summary (Signed)
[facility] Discharge Summary   Patient ID:  [name] MRN: [id number] DOB/AGE: [birth year] 52 y.o.  Admit date: 11/24/2017 Discharge date: 12/09/2017 Date of Surgery: 11/28/2017 - 11/29/2017 Surgeon: Juliann Mule): [name], MD  Admission Diagnosis: pain control, occluded stent  Discharge Diagnoses:  pain control, occluded stent  Secondary Diagnoses: Past Medical History:  Diagnosis Date  . Arthritis   . Asthma   . Chronic back pain   . Depression   . Diabetes mellitus   . Diabetic neuropathy ([place])   . Family history of adverse reaction to anesthesia    mother had difficlty waking   . 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 GERD (gastroesophageal reflux disease)   . Hyperlipidemia   . Osteomyelitis of right fibula ([place]) 03/05/2017  . PAD (peripheral artery disease) ([place])   . Paroxysmal atrial fibrillation with rapid ventricular response ([place]) 12/02/2017  . Ulcer    Foot    Procedure(s): Left Lower Extremity Embolectomy, Left Tibial Peroneal Trunk Endarterectomy with Patch Angioplasty; Vein Harvest Small Saphenous Graft Left Lower Leg  Discharged Condition: stable  HPI: 52 y/o female who is followed by Dr. Trula Slade.  She is s/p bilateral femoral-popliteal bypass grafts for ulcer disease.   She has had multiple interventions for recurrent stenosis. She has also undergone balloon angioplasty of her right subclavian artery for upper extremity numbness.  While in the hospital in February 2018 she had a wound on her right foot. 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      Pre op exam reveals no pedal pulses, right leg ulcer, and CTA confirms interval occlusion of the left iliac system as well as a left femoral-popliteal bypass graft.  The right femoral-popliteal bypass graft remains patent despite occlusion of the right iliac artery.    Hospital Course:  [name] is a 52 y.o.  female is S/P   Procedure:   #1: Aortobifemoral bypass graft using a14 x 7 bifurcated Dacron graft                         #2: Reimplantation, inferior mesenteric artery                         #3: Redo bilateral common femoral artery exposure                         #4: Thrombectomy of bilateral femoral below-knee popliteal artery bypass graft                         #5: Reimplantation of left femoral-popliteal bypass graft to the aortobifemoral graft                         #6: Revision of proximal portion of right femoral popliteal bypass graft using a interposition 6 mm propatent Gore-Tex                                graft                         #7: Left lower extremity angiogram                         #  8: Intra-arterial administration of nitroglycerin                         #9: Left common femoral and profunda femoral endarterectomy                         #10: Aortic endarterectomy                             #11:  Placement of bilateral femoral Provena wound vacs  Post operative rounds revealed that the left fem-pop by pass was occluded.  The patient was returned to the OR that evening.  Left Lower Extremity Embolectomy, Left Tibial Peroneal Trunk Endarterectomy with Patch Angioplasty; Vein Harvest Small Saphenous Graft Left Lower Leg  Her left foot showed evidence of ischemic changes with blistering.  Over the next several days her left foot ischmia improved with Doppler signals in all three tibial vessels.  Residual later/plantar 2 cm black eschar and and fifth toe ischemic changes.   She was hypotensive post op and supported on Neo drip until 12/03/2017.  She was then transferred to 4E.  A PICC line was placed to maintain IV access.  Upper arm BP's are not accurate due to subclavian occlusive dieases bilaterally.  Full dose Heparin was maintained for A fib.     The groin incisions were maintain with Prevena wound vacs for 7 days, followed by black sponge wound vacs.  Prior to  discharge inspection of the groins revealed well healing incisions without breakdown.  She was discharged with Prevena wound vacs B groins placed on 12/09/2017.  These will remain intact for 7-10 days.  The Abdominal incision healing without intervention.  Left LE medial calf incision healing well, staples intact.  B LE edema, encouraged elevation.     She was discharged home on Xarelto and Crestor with [facility] RN and PT.      Consults:  Cardiology   Significant Diagnostic Studies: CBC Lab Results  Component Value Date   WBC 15.2 (H) 12/09/2017   HGB 8.4 (L) 12/09/2017   HCT 26.2 (L) 12/09/2017   MCV 87.0 12/09/2017   PLT 262 12/09/2017    BMET    Component Value Date/Time   NA 134 (L) 12/08/2017 1055   K 3.9 12/08/2017 1055   CL 99 (L) 12/08/2017 1055   CO2 25 12/08/2017 1055   GLUCOSE 276 (H) 12/08/2017 1055   BUN 11 12/08/2017 1055   CREATININE 0.76 12/08/2017 1055   CREATININE 1.24 (H) 08/12/2017 1530   CALCIUM 7.9 (L) 12/08/2017 1055   GFRNONAA >60 12/08/2017 1055   GFRNONAA 50 (L) 08/12/2017 1530   GFRAA >60 12/08/2017 1055   GFRAA 57 (L) 08/12/2017 1530   COAG Lab Results  Component Value Date   INR 1.40 11/28/2017   INR 1.30 11/28/2017   INR 1.18 11/24/2017     Disposition:  Discharge to :Home Discharge Instructions    Call MD for:  redness, tenderness, or signs of infection (pain, swelling, bleeding, redness, odor or green/yellow discharge around incision site)   Complete by:  As directed    Call MD for:  redness, tenderness, or signs of infection (pain, swelling, bleeding, redness, odor or green/yellow discharge around incision site)   Complete by:  As directed    Call MD for:  severe or increased pain, loss or decreased feeling  in affected limb(s)  Complete by:  As directed    Call MD for:  severe or increased pain, loss or decreased feeling  in affected limb(s)   Complete by:  As directed    Call MD for:  temperature >100.5   Complete by:  As  directed    Call MD for:  temperature >100.5   Complete by:  As directed    Resume previous diet   Complete by:  As directed    Resume previous diet   Complete by:  As directed      Allergies as of 12/09/2017   No Known Allergies     Medication List    STOP taking these medications   HYDROcodone-acetaminophen 10-325 MG tablet Commonly known as:  NORCO     TAKE these medications   albuterol 2 MG tablet Commonly known as:  PROVENTIL Take 2 mg by mouth 3 (three) times daily as needed for shortness of breath.   cetirizine 10 MG tablet Commonly known as:  ZYRTEC Take 10 mg by mouth daily as needed for allergies.   dicyclomine 10 MG capsule Commonly known as:  BENTYL Take 10 mg by mouth 4 (four) times daily as needed for spasms.   doxycycline 100 MG tablet Commonly known as:  VIBRA-TABS Take 1 tablet (100 mg total) by mouth 2 (two) times daily.   HUMALOG KWIKPEN 100 UNIT/ML KiwkPen Generic drug:  insulin lispro Inject 10 Units into the skin See admin instructions. 10 units three times a day before meals per sliding scale   LANTUS SOLOSTAR 100 UNIT/ML Solostar Pen Generic drug:  Insulin Glargine Inject 36 Units into the skin 2 (two) times daily.   omeprazole 40 MG capsule Commonly known as:  PRILOSEC Take 40 mg by mouth daily.   ondansetron 4 MG disintegrating tablet Commonly known as:  ZOFRAN ODT Take 1 tablet (4 mg total) by mouth every 8 (eight) hours as needed for nausea or vomiting.   oxyCODONE-acetaminophen 5-325 MG tablet Commonly known as:  PERCOCET/ROXICET Take 1-2 tablets by mouth every 4 (four) hours as needed for moderate pain.   pregabalin 100 MG capsule Commonly known as:  LYRICA Limit 1 tab by mouth twice a day to 3 times a day if tolerated What changed:    how much to take  how to take this  when to take this  additional instructions   rivaroxaban 20 MG Tabs tablet Commonly known as:  XARELTO Take 1 tablet (20 mg total) by mouth daily  with supper.   rosuvastatin 20 MG tablet Commonly known as:  CRESTOR Take 1 tablet (20 mg total) by mouth daily at 6 PM.      Verbal and written Discharge instructions given to the patient. Wound care per Discharge AVS Follow-up Information    [facility], [name] Gave, MD Follow up.   Specialty:  Cardiology Why:  Dr. Darnelle Bos office will call you to arrange 30-day event monitor and follow-up visit Contact information: [address] [name] [address] [phone]        [facility] Follow up.   Specialty:  [place] Why:  They will continue to do your home health care at your home Contact information: [address] [place] [address] [phone]           Signed: [name] 12/10/2017, 8:24 AM - For VQI Registry use --- Instructions: Press F2 to tab through selections.  Delete question if not applicable.   Post-op:  Time to  Extubation: [ ]  In OR, [ ]  <12 hrs, [ ]  12-24 hrs, [ ]  > 24 hrs Vasopressors: Yes  ICU Stay: 4 days  Transfusion: Yes  If yes, 3 units given New Arrhythmia: No Ipsilateral amputation: [x ] no, [ ]  Minor, [ ]  BKA, [ ]  AKA Discharge patency: [ x] Primary, [ ]  Primary assisted, [x ] Secondary, [ ]  Occluded Patency judged by: [x ] Dopper only, [ ]  Palpable graft pulse, [ ]   D/C Ambulatory Status: Ambulatory with Assistance  Complications: Wound complication: [ ]  No, [ ]  Superficial, [x ] Return to OR  Graft infection: No  Leg ischemia/emboli: [ ]  No, [ ]  Yes, no Surgery, [x ] Yes, Surgery req., [ ]  Amputation If amputation: side: [ ]  R: [ ]  minor, [ ]  BKA, [ ]  AKA; [ ]  L: [ ]  Minor, [ ]  BKA, [ ]  AKA  MI: [x ] No, [ ]  Troponin only, [ ]  EKG or Clinical CHF: No Resp failure: [x ] none, [ ]  Pneumonia, [ ]  Ventilator Chg in renal function: [x ] none, [ ]  Inc. Cr > 0.5, [ ]  Temp. Dialysis, [ ]  Permanent dialysis Stroke: [x ] None, [ ]  Minor, [ ]  Major Return to OR: Yes  Reason for return to  OR: [ ]  Bleeding, [ ]  Infection, [x ] Thrombosis, [ ]  Revision  Discharge medications: Statin use:  Yes ASA use:  No  for medical reason   Plavix use:  No  for medical reason   Beta blocker use: No  for medical reason   Coumadin use: No  for medical reason Xarelto

## 2017-12-13 LAB — STOOL CULTURE REFLEX - RSASHR

## 2017-12-13 LAB — STOOL CULTURE: E COLI SHIGA TOXIN ASSAY: NEGATIVE

## 2017-12-13 LAB — STOOL CULTURE REFLEX - CMPCXR

## 2017-12-15 ENCOUNTER — Ambulatory Visit: Payer: Medicare Other | Admitting: Surgery

## 2017-12-18 ENCOUNTER — Telehealth: Payer: Self-pay | Admitting: *Deleted

## 2017-12-18 ENCOUNTER — Encounter: Payer: Medicare Other | Attending: Nurse Practitioner | Admitting: Nurse Practitioner

## 2017-12-18 DIAGNOSIS — L97312 Non-pressure chronic ulcer of right ankle with fat layer exposed: Secondary | ICD-10-CM | POA: Insufficient documentation

## 2017-12-18 DIAGNOSIS — I4891 Unspecified atrial fibrillation: Secondary | ICD-10-CM | POA: Insufficient documentation

## 2017-12-18 DIAGNOSIS — S31819S Unspecified open wound of right buttock, sequela: Secondary | ICD-10-CM | POA: Insufficient documentation

## 2017-12-18 DIAGNOSIS — X58XXXS Exposure to other specified factors, sequela: Secondary | ICD-10-CM | POA: Insufficient documentation

## 2017-12-18 DIAGNOSIS — I70393 Other atherosclerosis of unspecified type of bypass graft(s) of the extremities, bilateral legs: Secondary | ICD-10-CM | POA: Insufficient documentation

## 2017-12-18 DIAGNOSIS — E1140 Type 2 diabetes mellitus with diabetic neuropathy, unspecified: Secondary | ICD-10-CM | POA: Diagnosis not present

## 2017-12-18 DIAGNOSIS — M1990 Unspecified osteoarthritis, unspecified site: Secondary | ICD-10-CM | POA: Insufficient documentation

## 2017-12-18 DIAGNOSIS — F17210 Nicotine dependence, cigarettes, uncomplicated: Secondary | ICD-10-CM | POA: Diagnosis not present

## 2017-12-18 DIAGNOSIS — Z794 Long term (current) use of insulin: Secondary | ICD-10-CM | POA: Insufficient documentation

## 2017-12-18 DIAGNOSIS — I70262 Atherosclerosis of native arteries of extremities with gangrene, left leg: Secondary | ICD-10-CM | POA: Insufficient documentation

## 2017-12-18 NOTE — Telephone Encounter (Signed)
Call from [facility] NP at [facility]. Patient there today and 4th and 5th toe dry necrosis and blistering; ulcer at lateral left foot and great toe. They will treat and dress today. States she DID doppler pulses Dorsal and post.. Tibial. States husband felt foot looked better. She did not feel that patient needed to go to ER, but wanted to give status report. Patient is due to see Dr. Trula Slade 12/22/17. Instructed to have patient report to [facility] ER for any worsening condition.

## 2017-12-21 NOTE — Progress Notes (Signed)
COLLIE, KITTEL ([id number]) Visit Report for 12/18/2017 Allergy List Details Patient Name: [name], [name]. Date of Service: 12/18/2017 10:30 AM Medical Record Number: [id number] Patient Account Number: [id number] Date of Birth/Sex: Mar 26, 1966 (52 y.o. Female) Treating RN: [name] Primary Care [name]: [name] Other Clinician: Referring [name]: Referral, Self Treating [name] in Treatment: 0 Electronic Signature(s) Signed: 12/19/2017 4:17:47 PM By: [name] Entered By: [name] on 12/18/2017 10:50:15 [name] ([id number]) -------------------------------------------------------------------------------- Arrival Information Details Patient Name: [name], [name]. Date of Service: 12/18/2017 10:30 AM Medical Record Number: [id number] Patient Account Number: [id number] Date of Birth/Sex: 08/14/1966 (52 y.o. Female) Treating RN: [name] Primary Care [name]: [name] Other Clinician: Referring Athziri [name]: Referral, Self Treating [name]/Extender: [name] in Treatment: 0 Visit Information Patient Arrived: Wheel Chair Arrival Time: 10:44 Accompanied By: spouse Transfer Assistance: EasyPivot Patient Lift Patient Identification Verified: Yes Secondary Verification Process Yes Completed: Patient Requires Transmission-Based No Precautions: Patient Has Alerts: Yes Patient Alerts: Patient on Blood Thinner DM II Xarelto History Since Last Visit All ordered tests and consults were completed: No Added or deleted any medications: No Any new allergies or adverse reactions: No Had a fall or experienced change in activities of daily living that may affect risk of falls: No Signs or symptoms of abuse/neglect since last visito No Hospitalized since last visit: No Has Dressing in Place as Prescribed: Yes Electronic Signature(s) Signed: 12/19/2017 4:17:47 PM By: [name] Entered By: [name]  on 12/18/2017 11:20:49 [name], [name] ([id number]) -------------------------------------------------------------------------------- Clinic Level of Care Assessment Details Patient Name: [name]. Date of Service: 12/18/2017 10:30 AM Medical Record Number: [id number] Patient Account Number: [id number] Date of Birth/Sex: October 13, 1966 (52 y.o. Female) Treating RN: Jamie Fiscal, [name]: [name] Other Clinician: Referring [name]: Referral, Self Treating [name]/Extender: [name] in Treatment: 0 Clinic Level of Care Assessment Items TOOL 1 Quantity Score X - Use when EandM and Procedure is performed on INITIAL visit 1 0 ASSESSMENTS - Nursing Assessment / Reassessment X - General Physical Exam (combine w/ comprehensive assessment (listed just below) when 1 20 performed on new pt. evals) X- 1 25 Comprehensive Assessment (HX, ROS, Risk Assessments, Wounds Hx, etc.) ASSESSMENTS - Wound and Skin Assessment / Reassessment []  - Dermatologic / Skin Assessment (not related to wound area) 0 ASSESSMENTS - Ostomy and/or Continence Assessment and Care []  - Incontinence Assessment and Management 0 []  - 0 Ostomy Care Assessment and Management (repouching, etc.) PROCESS - Coordination of Care []  - Simple Patient / Family Education for ongoing care 0 X- 1 20 Complex (extensive) Patient / Family Education for ongoing care X- 1 10 Staff obtains Programmer, systems, Records, Test Results / Process Orders X- 1 10 Staff telephones HHA, Nursing Homes / Clarify orders / etc []  - 0 Routine Transfer to another Facility (non-emergent condition) []  - 0 Routine Hospital Admission (non-emergent condition) X- 1 15 New Admissions / Biomedical engineer / Ordering NPWT, Apligraf, etc. []  - 0 Emergency Hospital Admission (emergent condition) PROCESS - Special Needs []  - Pediatric / Minor Patient Management 0 []  - 0 Isolation Patient Management []  - 0 Hearing / Language /  Visual special needs []  - 0 Assessment of Community assistance (transportation, D/C planning, etc.) []  - 0 Additional assistance / Altered mentation []  - 0 Support Surface(s) Assessment (bed, cushion, seat, etc.) [name], [name] ([id number]) INTERVENTIONS - Miscellaneous []  - External ear exam 0 []  - 0 Patient Transfer (multiple staff / Civil Service fast streamer / Similar  devices) []  - 0 Simple Staple / Suture removal (25 or less) []  - 0 Complex Staple / Suture removal (26 or more) []  - 0 Hypo/Hyperglycemic Management (do not check if billed separately) []  - 0 Ankle / Brachial Index (ABI) - do not check if billed separately Has the patient been seen at the hospital within the last three years: Yes Total Score: 100 Level Of Care: New/Established - Level 3 Electronic Signature(s) Signed: 12/19/2017 4:17:47 PM By: [name] Entered By: [name] on 12/18/2017 81:01:75 [name], [name] ([id number]) -------------------------------------------------------------------------------- Encounter Discharge Information Details Patient Name: [name]. Date of Service: 12/18/2017 10:30 AM Medical Record Number: [id number] Patient Account Number: [id number] Date of Birth/Sex: [birth year] (52 y.o. Female) Treating RN: [name] Primary Care Alaylah Heatherington: [name] Other Clinician: Referring Nayden Czajka: Referral, Self Treating [name]/Extender: [name] in Treatment: 0 Encounter Discharge Information Items Discharge Pain Level: 0 Discharge Condition: Stable Ambulatory Status: Wheelchair Discharge Destination: Home Transportation: Private Auto Accompanied By: husband Schedule Follow-up Appointment: Yes Medication Reconciliation completed and No provided to Patient/Care [name]: Provided on Clinical Summary of Care: 12/18/2017 Form Type Recipient Paper Patient RM Electronic Signature(s) Signed: 12/18/2017 12:49:51 PM By: [name] Entered By: [name] on  12/18/2017 12:49:51 [name], [name] ([id number]) -------------------------------------------------------------------------------- Lower Extremity Assessment Details Patient Name: [name]. Date of Service: 12/18/2017 10:30 AM Medical Record Number: [id number] Patient Account Number: [id number] Date of Birth/Sex: Aug 15, 1966 (52 y.o. Female) Treating RN: [name] Primary Care Darnette Lampron: [name] Other Clinician: Referring Richards Pherigo: Referral, Self Treating Chanin Frumkin/Extender: [name] in Treatment: 0 Vascular Assessment Pulses: Dorsalis Pedis Palpable: [Left:No] [Right:No] Doppler Audible: [Left:Yes] [Right:Yes] Posterior Tibial Palpable: [Left:No] [Right:No] Doppler Audible: [Left:Yes] [Right:Yes] Extremity colors, hair growth, and conditions: Extremity Color: [Left:Pale] [Right:Pale] Temperature of Extremity: [Left:Warm] [Right:Warm] Toe Nail Assessment Left: Right: Thick: Yes Yes Discolored: Yes Yes Deformed: Yes Yes Improper Length and Hygiene: Yes Yes Electronic Signature(s) Signed: 12/19/2017 4:17:47 PM By: [name] Previous Signature: 12/18/2017 11:30:07 AM Version By: [name] Entered By: [name] on 12/18/2017 11:56:21 [name], [name] ([id number]) -------------------------------------------------------------------------------- Multi Wound Chart Details Patient Name: [name]. Date of Service: 12/18/2017 10:30 AM Medical Record Number: [id number] Patient Account Number: [id number] Date of Birth/Sex: 1966-04-29 (52 y.o. Female) Treating RN: [name] Primary Care Maridee Slape: [name] Other Clinician: Referring Rosamary Boudreau: Referral, Self Treating Kairee Kozma/Extender: [name] in Treatment: 0 Vital Signs Height(in): 67 Pulse(bpm): 91 Weight(lbs): Blood Pressure(mmHg): 82/57 Body Mass Index(BMI): Temperature(F): 97.8 Respiratory Rate 18 (breaths/min): Photos: [3:No Photos] [4:No Photos] [5:No  Photos] Wound Location: [3:Right Malleolus - Lateral] [4:Left Calcaneus - Lateral] [5:Right Toe Great - Circumfernential] Wounding Event: [3:Pressure Injury] [4:Blister] [5:Gradually Appeared] 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] [4:Lower Extremity] [5:Lower Extremity] Comorbid History: [3:Anemia, Arrhythmia, Coronary Anemia, Arrhythmia, Coronary Anemia, Arrhythmia, Coronary Artery Disease, Hypotension, Artery Disease, Hypotension, Artery Disease, Hypotension, Peripheral Arterial Disease, Peripheral Arterial Disease,  Peripheral Arterial Disease, Type II Diabetes, Osteoarthritis] [4:Type II Diabetes, Osteoarthritis] [5:Type II Diabetes, Osteoarthritis] Date Acquired: [3:09/23/2016] [4:11/27/2017] [5:11/27/2017] Weeks of Treatment: [3:0] [4:0] [5:0] Wound Status: [3:Open] [4:Open] [5:Open] Pending Amputation on [3:No] [4:Yes] [5:Yes] Presentation: Measurements L x W x D [3:0.9x1.2x0.2] [4:3.7x5.6x0.2] [5:4.5x8.5x0.1] (cm) Area (cm) : [3:0.848] [4:16.273] [5:30.041] Volume (cm) : [3:0.17] [4:3.255] [5:3.004] % Reduction in Area: [3:0.00%] [4:N/A] [5:N/A] % Reduction in Volume: [3:0.00%] [4:N/A] [5:N/A] Undermining: [3:No] [4:No] [5:No] Classification: [3:Grade 2] [4:Grade 2] [5:Grade 2] Exudate Amount: [3:Large] [4:Large] [5:Large] Exudate Type: [3:Serous] [4:Serosanguineous] [  5:Serosanguineous] Exudate Color: [3:amber] [4:red, brown] [5:red, brown] Wound Margin: [3:Distinct, outline attached] [4:Distinct, outline attached] [5:Distinct, outline attached] Granulation Amount: [3:Medium (34-66%)] [4:None Present (0%)] [5:Large (67-100%)] Granulation Quality: [3:Pink] [4:N/A] [5:Red] Necrotic Amount: [3:Medium (34-66%)] [4:Large (67-100%)] [5:Small (1-33%)] Necrotic Tissue: [3:Adherent Slough] [4:Eschar, Adherent Slough] [5:Eschar, Adherent Slough] Exposed Structures: [3:Fascia: No Fat Layer (Subcutaneous  Tissue) Exposed: No Tendon: No Muscle: No] [4:Fascia: No Fat Layer (Subcutaneous Tissue) Exposed: No Tendon: No Muscle: No] [5:N/A] Joint: No Joint: No Bone: No Bone: No Epithelialization: None None None Debridement: Debridement ([id number]) N/A N/A Pre-procedure 11:56 N/A N/A Verification/Time Out Taken: Pain Control: Lidocaine 4% Topical Solution N/A N/A Tissue Debrided: Fibrin/Slough, Exudates, N/A N/A Subcutaneous Level: Skin/Subcutaneous Tissue N/A N/A Debridement Area (sq cm): 1.08 N/A N/A Instrument: Curette N/A N/A Bleeding: Minimum N/A N/A Hemostasis Achieved: Pressure N/A N/A Procedural Pain: 0 N/A N/A Post Procedural Pain: 0 N/A N/A Debridement Treatment Procedure was tolerated well N/A N/A Response: Post Debridement 0.9x1.2x0.3 N/A N/A Measurements L x W x D (cm) Post Debridement Volume: 0.254 N/A N/A (cm) Periwound Skin Texture: No Abnormalities Noted No Abnormalities Noted No Abnormalities Noted Periwound Skin Moisture: Maceration: Yes Maceration: Yes No Abnormalities Noted Periwound Skin Color: No Abnormalities Noted No Abnormalities Noted No Abnormalities Noted Temperature: No Abnormality No Abnormality No Abnormality Tenderness on Palpation: No Yes No Wound Preparation: Ulcer Cleansing: Ulcer Cleansing: Ulcer Cleansing: Rinsed/Irrigated with Saline Rinsed/Irrigated with Saline Rinsed/Irrigated with Saline Topical Anesthetic Applied: Topical Anesthetic Applied: Topical Anesthetic Applied: Other: lidocaine 4% Other: lidocaine 4% Other: lidocaine 4% Procedures Performed: Debridement N/A N/A Wound Number: [id number]  Photos: No Photos No Photos No Photos Wound Location: Left Toe Fourth - Right Gluteus Left Toe Third Circumfernential Wounding Event: Gradually Appeared Gradually Appeared Gradually Appeared 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 Comorbid History: Anemia,  Arrhythmia, Coronary Anemia, Arrhythmia, Coronary Anemia, Arrhythmia, Coronary Artery Disease, Hypotension, Artery Disease, Hypotension, Artery Disease, Hypotension, Peripheral Arterial Disease, Peripheral Arterial Disease, Peripheral Arterial Disease, Type II Diabetes, Type II Diabetes, Type II Diabetes, Osteoarthritis Osteoarthritis Osteoarthritis Date Acquired: 11/27/2017 09/18/2016 11/27/2017 Weeks of Treatment: 0 0 0 Wound Status: Open Open Open Pending Amputation on Yes No Yes Presentation: Measurements L x W x D 2.4x2.5x0.1 1.9x1x0.7 8x16x0.1 (cm) Area (cm) : 4.712 1.492 100.531 Volume (cm) : 0.471 1.045 10.053 % Reduction in Area: N/A N/A N/A [name], [name] ([id number]) % Reduction in Volume: N/A N/A N/A Starting Position 1 12 (o'clock): Ending Position 1 6 (o'clock): Maximum Distance 1 (cm): 2.6 Undermining: No Yes N/A 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 Exudate Amount: Large Large Medium Exudate Type: Serosanguineous Serosanguineous Purulent Exudate Color: red, brown red, brown yellow, brown, green Wound Margin: Distinct, outline attached Distinct, outline attached Distinct, outline attached Granulation Amount: None Present (0%) Large (67-100%) None Present (0%) Granulation Quality: N/A Red N/A Necrotic Amount: Large (67-100%) Small (1-33%) Large (67-100%) Necrotic Tissue: Eschar, Adherent Slough Adherent [facility], Adherent Slough Exposed Structures: N/A N/A N/A Epithelialization: None N/A N/A Debridement: N/A N/A N/A Pain Control: N/A N/A N/A Tissue Debrided: N/A N/A N/A Level: N/A N/A N/A Debridement Area (sq cm): N/A N/A N/A Instrument: N/A N/A N/A Bleeding: N/A N/A N/A Hemostasis Achieved: N/A N/A N/A Procedural Pain: N/A N/A N/A Post Procedural Pain: N/A N/A N/A Debridement Treatment N/A N/A N/A Response: Post Debridement N/A N/A N/A Measurements L x W x D (cm) Post Debridement Volume: N/A N/A N/A (cm) Periwound Skin Texture: No  Abnormalities  Noted Scarring: Yes No Abnormalities Noted Periwound Skin Moisture: No Abnormalities Noted No Abnormalities Noted Maceration: Yes Periwound Skin Color: No Abnormalities Noted No Abnormalities Noted No Abnormalities Noted Temperature: No Abnormality No Abnormality No Abnormality Tenderness on Palpation: No No No Wound Preparation: Ulcer Cleansing: Ulcer Cleansing: Ulcer Cleansing: Rinsed/Irrigated with Saline Rinsed/Irrigated with Saline Rinsed/Irrigated with Saline Topical Anesthetic Applied: Topical Anesthetic Applied: Topical Anesthetic Applied: Other: 02/15/2018 Other: lidocaine 4% Other: lidocaine 4% Procedures Performed: N/A N/A N/A Wound Number: [id number] N/A N/A Photos: No Photos N/A N/A Wound Location: Left Toe Third N/A N/A Wounding Event: Gradually Appeared N/A N/A Primary Etiology: Diabetic Wound/Ulcer of the N/A N/A Lower Extremity Comorbid History: Anemia, Arrhythmia, Coronary N/A N/A Artery Disease, Hypotension, Peripheral Arterial Disease, Cradle, Idolina S. ([id number]) Type II Diabetes, Osteoarthritis Date Acquired: 11/27/2017 N/A N/A Weeks of Treatment: 0 N/A N/A Wound Status: Open N/A N/A Pending Amputation on Yes N/A N/A Presentation: Measurements L x W x D 0.5x0.9x0.1 N/A N/A (cm) Area (cm) : 0.353 N/A N/A Volume (cm) : 0.035 N/A N/A % Reduction in Area: N/A N/A N/A % Reduction in Volume: N/A N/A N/A Undermining: No N/A N/A Classification: Grade 2 N/A N/A Exudate Amount: None Present N/A N/A Exudate Type: N/A N/A N/A Exudate Color: N/A N/A N/A Wound Margin: Distinct, outline attached N/A N/A Granulation Amount: None Present (0%) N/A N/A Granulation Quality: N/A N/A N/A Necrotic Amount: Large (67-100%) N/A N/A Necrotic Tissue: Eschar N/A N/A Exposed Structures: N/A N/A N/A Epithelialization: None N/A N/A Debridement: N/A N/A N/A Pain Control: N/A N/A N/A Tissue Debrided: N/A N/A N/A Level: N/A N/A N/A Debridement Area (sq cm): N/A N/A  N/A Instrument: N/A N/A N/A Bleeding: N/A N/A N/A Hemostasis Achieved: N/A N/A N/A Procedural Pain: N/A N/A N/A Post Procedural Pain: N/A N/A N/A Debridement Treatment N/A N/A N/A Response: Post Debridement N/A N/A N/A Measurements L x W x D (cm) Post Debridement Volume: N/A N/A N/A (cm) Periwound Skin Texture: No Abnormalities Noted N/A N/A Periwound Skin Moisture: No Abnormalities Noted N/A N/A Periwound Skin Color: No Abnormalities Noted N/A N/A Temperature: No Abnormality N/A N/A Tenderness on Palpation: No N/A N/A Wound Preparation: Ulcer Cleansing: N/A N/A Rinsed/Irrigated with Saline Topical Anesthetic Applied: Other: lidocaine 4% Procedures Performed: N/A N/A N/A Treatment Notes Wound #3 (Right, Lateral Malleolus) Mottola, Elodia S. ([id number]) 1. Cleansed with: Clean wound with Normal Saline 2. Anesthetic Topical Lidocaine 4% cream to wound bed prior to debridement 4. Dressing Applied: Prisma Ag 5. Secondary Dressing Applied Bordered Foam Dressing Wound #4 (Left, Lateral Calcaneus) 1. Cleansed with: Clean wound with Normal Saline 2. Anesthetic Topical Lidocaine 4% cream to wound bed prior to debridement 4. Dressing Applied: Other dressing (specify in notes) 5. Secondary Dressing Applied ABD Pad Kerlix/Conform 7. Secured with Tape Notes betadine to left 5th toe, silvercel Wound #5 (Right, Circumferential Toe Great) 1. Cleansed with: Clean wound with Normal Saline 2. Anesthetic Topical Lidocaine 4% cream to wound bed prior to debridement 4. Dressing Applied: Other dressing (specify in notes) 5. Secondary Dressing Applied ABD Pad Kerlix/Conform 7. Secured with Tape Notes betadine Wound #6 (Left, Circumferential Toe Fourth) 1. Cleansed with: Clean wound with Normal Saline 2. Anesthetic Topical Lidocaine 4% cream to wound bed prior to debridement 4. Dressing Applied: Other dressing (specify in notes) 5. Secondary Dressing Applied ABD  Pad Kerlix/Conform 7. Secured with Tape Notes Camilli, Chattie S. ([id number]) betadine Wound #7 (Right Gluteus) 1. Cleansed with: Clean wound with Normal Saline 2. Anesthetic Topical Lidocaine 4% cream to  wound bed prior to debridement 4. Dressing Applied: Prisma Ag 5. Secondary Dressing Applied Bordered Foam Dressing Wound #8 (Left, Circumferential Foot) 1. Cleansed with: Clean wound with Normal Saline 2. Anesthetic Topical Lidocaine 4% cream to wound bed prior to debridement 4. Dressing Applied: Other dressing (specify in notes) 5. Secondary Dressing Applied ABD Pad Kerlix/Conform 7. Secured with Tape Notes betadine to left 5th toe, silvercel Wound #9 (Left Toe Third) 1. Cleansed with: Clean wound with Normal Saline 2. Anesthetic Topical Lidocaine 4% cream to wound bed prior to debridement 4. Dressing Applied: Other dressing (specify in notes) 5. Secondary Dressing Applied ABD Pad Kerlix/Conform 7. Secured with Tape Notes betadine Electronic Signature(s) Signed: 12/18/2017 5:46:58 PM By: Lawanda Cousins Previous Signature: 12/18/2017 12:44:17 PM Version By: [name] Entered By: Lawanda Cousins on 12/18/2017 13:11:55 [name], [name] ([id number]) -------------------------------------------------------------------------------- [place] Details Patient Name: TOWANA, STENGLEIN. Date of Service: 12/18/2017 10:30 AM Medical Record Number: [id number] Patient Account Number: [id number] Date of Birth/Sex: 1966-09-03 (52 y.o. Female) Treating RN: [name] Primary Care Kristine Tiley: [name] Other Clinician: Referring Montrice Montuori: Referral, Self Treating Charnese Federici/Extender: [name] in Treatment: 0 Active Inactive ` Abuse / Safety / Falls / Self Care Management Nursing Diagnoses: Potential for falls Goals: Patient will not experience any injury related to falls Date Initiated: 12/18/2017 Target Resolution Date: 02/21/2018 Goal  Status: Active Interventions: Assess Activities of Daily Living upon admission and as needed Assess fall risk on admission and as needed Assess: immobility, friction, shearing, incontinence upon admission and as needed Assess impairment of mobility on admission and as needed per policy Assess personal safety and home safety (as indicated) on admission and as needed Assess self care needs on admission and as needed Provide education on basic hygiene Provide education on fall prevention Notes: ` Necrotic Tissue Nursing Diagnoses: Impaired tissue integrity related to necrotic/devitalized tissue Knowledge deficit related to management of necrotic/devitalized tissue Goals: Patient/caregiver will verbalize understanding of reason and process for debridement of necrotic tissue Date Initiated: 12/18/2017 Target Resolution Date: 03/28/2018 Goal Status: Active Interventions: Assess patient pain level pre-, during and post procedure and prior to discharge Provide education on necrotic tissue and debridement process Notes: ` Nutrition Siddiqi, [name] ([id number]) Nursing Diagnoses: Imbalanced nutrition Impaired glucose control: actual or potential Potential for alteratiion in Nutrition/Potential for imbalanced nutrition Goals: Patient/caregiver verbalizes understanding of need to maintain therapeutic glucose control per primary care physician Date Initiated: 12/18/2017 Target Resolution Date: 02/21/2018 Goal Status: Active Interventions: Assess patient nutrition upon admission and as needed per policy Notes: ` Orientation to the Wound Care Program Nursing Diagnoses: Knowledge deficit related to the wound healing center program Goals: Patient/caregiver will verbalize understanding of the [place] Program Date Initiated: 12/18/2017 Target Resolution Date: 12/27/2017 Goal Status: Active Interventions: Provide education on orientation to the wound center Notes: ` Pain, Acute  or Chronic Nursing Diagnoses: Pain, acute or chronic: actual or potential Potential alteration in comfort, pain Goals: Patient/caregiver will verbalize adequate pain control between visits Date Initiated: 12/18/2017 Target Resolution Date: 03/28/2018 Goal Status: Active Interventions: Complete pain assessment as per visit requirements Notes: ` Wound/Skin Impairment Nursing Diagnoses: Impaired tissue integrity [name], [name] ([id number]) Knowledge deficit related to ulceration/compromised skin integrity Goals: Ulcer/skin breakdown will have a volume reduction of 80% by week 12 Date Initiated: 12/18/2017 Target Resolution Date: 03/21/2018 Goal Status: Active Interventions: Assess patient/caregiver ability to perform ulcer/skin care regimen upon admission and as needed Assess ulceration(s) every visit Notes: Electronic Signature(s) Signed: 12/18/2017 12:44:05 PM  By: [name] Entered By: [name] on 12/18/2017 12:44:04 [name], [name] ([id number]) -------------------------------------------------------------------------------- Pain Assessment Details Patient Name: LIAHNA, BRICKNER S. Date of Service: 12/18/2017 10:30 AM Medical Record Number: [id number] Patient Account Number: [id number] Date of Birth/Sex: 1966-08-20 (52 y.o. Female) Treating RN: [name] Primary Care Preeti Winegardner: [name] Other Clinician: Referring Mylan Schwarz: Referral, Self Treating Zahava Quant/Extender: [name] in Treatment: 0 Active Problems Location of Pain Severity and Description of Pain Patient Has Paino Yes Site Locations Pain Location: Generalized Pain, Pain in Ulcers Rate the pain. Current Pain Level: 8 Character of Pain Describe the Pain: Aching, Tender, Throbbing Pain Management and Medication Current Pain Management: Electronic Signature(s) Signed: 12/19/2017 4:17:47 PM By: [name] Entered By: [name] on 12/18/2017 10:45:36 [name], [name]  ([id number]) -------------------------------------------------------------------------------- Patient/Caregiver Education Details Patient Name: [name]. Date of Service: 12/18/2017 10:30 AM Medical Record Number: [id number] Patient Account Number: [id number] Date of Birth/Gender: [birth year] (52 y.o. Female) Treating RN: [name] Primary Care Physician: [name] Other Clinician: Referring Physician: Referral, Self Treating Physician/Extender: [name] in Treatment: 0 Education Assessment Education Provided To: Patient and Caregiver husband Education Topics Provided Basic Hygiene: Methods: Explain/Verbal Responses: State content correctly Elevated Blood Sugar/ Impact on Healing: Handouts: Elevated Blood Sugars: How Do They Affect Wound Healing Methods: Explain/Verbal Responses: State content correctly Safety: Handouts: Safe Transfers Methods: Explain/Verbal Responses: State content correctly Welcome To [facility]: Handouts: Welcome To [facility] Methods: Explain/Verbal Responses: State content correctly Wound Debridement: Handouts: Wound Debridement Methods: Demonstration, Explain/Verbal Responses: State content correctly Wound/Skin Impairment: Handouts: Caring for Your Ulcer, Other: change dressing as ordered Methods: Demonstration, Explain/Verbal Responses: State content correctly Electronic Signature(s) Signed: 12/19/2017 4:17:47 PM By: [name] Entered By: [name] on 12/18/2017 12:50:54 Funnell, Elis S. ([id number]) -------------------------------------------------------------------------------- Wound Assessment Details Patient Name: [name]. Date of Service: 12/18/2017 10:30 AM Medical Record Number: [id number] Patient Account Number: [id number] Date of Birth/Sex: 06/11/1966 (52 y.o. Female) Treating RN: [name] Primary Care Eilee Schader: [name] Other Clinician: Referring Michaelyn Wall:  Referral, Self Treating Eleyna Brugh/Extender: [name] in Treatment: 0 Wound Status Wound Number: 3 Primary Diabetic Wound/Ulcer of the Lower Extremity Etiology: Wound Location: Right Malleolus - Lateral Wound Open Wounding Event: Pressure Injury Status: Date Acquired: 09/23/2016 Comorbid Anemia, Arrhythmia, Coronary Artery Disease, Weeks Of Treatment: 0 History: Hypotension, Peripheral Arterial Disease, Type Clustered Wound: No II Diabetes, Osteoarthritis Photos Photo Uploaded By: [name] on 12/19/2017 08:38:56 Wound Measurements Length: (cm) 0.9 Width: (cm) 1.2 Depth: (cm) 0.2 Area: (cm) 0.848 Volume: (cm) 0.17 % Reduction in Area: 0% % Reduction in Volume: 0% Epithelialization: None Tunneling: No Undermining: No Wound Description Classification: Grade 2 Wound Margin: Distinct, outline attached Exudate Amount: Large Exudate Type: Serous Exudate Color: amber Foul Odor After Cleansing: No Slough/Fibrino Yes Wound Bed Granulation Amount: Medium (34-66%) Exposed Structure Granulation Quality: Pink Fascia Exposed: No Necrotic Amount: Medium (34-66%) Fat Layer (Subcutaneous Tissue) Exposed: No Necrotic Quality: Adherent Slough Tendon Exposed: No Muscle Exposed: No Joint Exposed: No Bone Exposed: No Periwound Skin Texture Bangert, Rozella S. ([id number]) Texture Color No Abnormalities Noted: No No Abnormalities Noted: No Moisture Temperature / Pain No Abnormalities Noted: No Temperature: No Abnormality Maceration: Yes Wound Preparation Ulcer Cleansing: Rinsed/Irrigated with Saline Topical Anesthetic Applied: Other: lidocaine 4%, Treatment Notes Wound #3 (Right, Lateral Malleolus) 1. Cleansed with: Clean wound with Normal Saline 2. Anesthetic Topical Lidocaine 4% cream to wound bed prior to debridement 4. Dressing Applied: Prisma Ag 5. Secondary Dressing  Applied Bordered Foam Dressing Electronic Signature(s) Signed: 12/18/2017 11:32:55  AM By: [name] Entered By: [name] on 12/18/2017 11:32:55 Flott, Sharalyn Chauncey [name] ([id number]) -------------------------------------------------------------------------------- Wound Assessment Details Patient Name: [name]. Date of Service: 12/18/2017 10:30 AM Medical Record Number: [id number] Patient Account Number: [id number] Date of Birth/Sex: [birth year] (52 y.o. Female) Treating RN: [name] Primary Care Kinnick Maus: [name] Other Clinician: Referring Kelvon Giannini: Referral, Self Treating Selena Swaminathan/Extender: [name] in Treatment: 0 Wound Status Wound Number: 4 Primary Diabetic Wound/Ulcer of the Lower Extremity Etiology: Wound Location: Left Calcaneus - Lateral Wound Open Wounding Event: Blister Status: Date Acquired: 11/27/2017 Comorbid Anemia, Arrhythmia, Coronary Artery Disease, Weeks Of Treatment: 0 History: Hypotension, Peripheral Arterial Disease, Type Clustered Wound: No II Diabetes, Osteoarthritis Photos Photo Uploaded By: [name] on 12/19/2017 08:39:29 Wound Measurements Length: (cm) 3.7 Width: (cm) 5.6 Depth: (cm) 0.2 Area: (cm) 16.273 Volume: (cm) 3.255 % Reduction in Area: % Reduction in Volume: Epithelialization: None Tunneling: No Undermining: No Wound Description Classification: Grade 2 Wound Margin: Distinct, outline attached Exudate Amount: Large Exudate Type: Serosanguineous Exudate Color: red, brown Foul Odor After Cleansing: No Slough/Fibrino Yes Wound Bed Granulation Amount: None Present (0%) Exposed Structure Necrotic Amount: Large (67-100%) Fascia Exposed: No Necrotic Quality: Eschar, Adherent Slough Fat Layer (Subcutaneous Tissue) Exposed: No Tendon Exposed: No Muscle Exposed: No Joint Exposed: No Bone Exposed: No Periwound Skin Texture Warn, Lus S. ([id number]) Texture Color No Abnormalities Noted: No No Abnormalities Noted: No Moisture Temperature / Pain No Abnormalities Noted:  No Temperature: No Abnormality Maceration: Yes Tenderness on Palpation: Yes Wound Preparation Ulcer Cleansing: Rinsed/Irrigated with Saline Topical Anesthetic Applied: Other: lidocaine 4%, Electronic Signature(s) Signed: 12/18/2017 11:34:17 AM By: [name] Entered By: [name] on 12/18/2017 11:34:16 Nazar, Ross. ([id number]) -------------------------------------------------------------------------------- Wound Assessment Details Patient Name: [name]. Date of Service: 12/18/2017 10:30 AM Medical Record Number: [id number] Patient Account Number: [id number] Date of Birth/Sex: 04-16-1966 (52 y.o. Female) Treating RN: [name] Primary Care Omer Puccinelli: [name] Other Clinician: Referring Romel Dumond: Referral, Self Treating Jacqlyn Marolf/Extender: [name] in Treatment: 0 Wound Status Wound Number: 5 Primary Diabetic Wound/Ulcer of the Lower Extremity Etiology: Wound Location: Right Toe Great - Circumfernential Wound Open Wounding Event: Gradually Appeared Status: Date Acquired: 11/27/2017 Comorbid Anemia, Arrhythmia, Coronary Artery Disease, Weeks Of Treatment: 0 History: Hypotension, Peripheral Arterial Disease, Type Clustered Wound: No II Diabetes, Osteoarthritis Photos Photo Uploaded By: [name] on 12/18/2017 16:34:19 Wound Measurements Length: (cm) 4.5 Width: (cm) 8.5 Depth: (cm) 0.1 Area: (cm) 30.041 Volume: (cm) 3.004 % Reduction in Area: % Reduction in Volume: Epithelialization: None Tunneling: No Undermining: No Wound Description Classification: Grade 2 Wound Margin: Distinct, outline attached Exudate Amount: Large Exudate Type: Serosanguineous Exudate Color: red, brown Foul Odor After Cleansing: No Slough/Fibrino Yes Wound Bed Granulation Amount: Large (67-100%) Granulation Quality: Red Necrotic Amount: Small (1-33%) Necrotic Quality: Eschar, Adherent Slough Periwound Skin Texture Texture Color No  Abnormalities Noted: No No Abnormalities Noted: No Moisture Temperature / Pain [name], Penney S. ([id number]) No Abnormalities Noted: No Temperature: No Abnormality Wound Preparation Ulcer Cleansing: Rinsed/Irrigated with Saline Topical Anesthetic Applied: Other: lidocaine 4%, Electronic Signature(s) Signed: 12/18/2017 11:35:39 AM By: [name] Entered By: [name] on 12/18/2017 11:35:39 Calandra, Mercie S. ([id number]) -------------------------------------------------------------------------------- Wound Assessment Details Patient Name: [name]. Date of Service: 12/18/2017 10:30 AM Medical Record Number: [id number] Patient Account Number: [id number] Date of Birth/Sex: 04/25/1966 (52 y.o. Female) Treating RN: [name] Primary Care Lloyde Ludlam: [name] Other Clinician: Referring Alli Jasmer: Referral, Self Treating  Glynn Freas/Extender: Lawanda Cousins Weeks in Treatment: 0 Wound Status Wound Number: 6 Primary Diabetic Wound/Ulcer of the Lower Extremity Etiology: Wound Location: Left Toe Fourth - Circumfernential Wound Open Wounding Event: Gradually Appeared Status: Date Acquired: 11/27/2017 Comorbid Anemia, Arrhythmia, Coronary Artery Disease, Weeks Of Treatment: 0 History: Hypotension, Peripheral Arterial Disease, Type Clustered Wound: No II Diabetes, Osteoarthritis Photos Photo Uploaded By: [name] on 12/19/2017 08:39:30 Wound Measurements Length: (cm) 2.4 Width: (cm) 2.5 Depth: (cm) 0.1 Area: (cm) 4.712 Volume: (cm) 0.471 % Reduction in Area: % Reduction in Volume: Epithelialization: None Tunneling: No Undermining: No Wound Description Classification: Grade 2 Wound Margin: Distinct, outline attached Exudate Amount: Large Exudate Type: Serosanguineous Exudate Color: red, brown Foul Odor After Cleansing: No Slough/Fibrino Yes Wound Bed Granulation Amount: None Present (0%) Necrotic Amount: Large (67-100%) Necrotic Quality:  Eschar, Adherent Slough Periwound Skin Texture Texture Color No Abnormalities Noted: No No Abnormalities Noted: No Moisture Temperature / Pain No Abnormalities Noted: No Temperature: No Abnormality Tinnell, Katherine S. ([id number]) Wound Preparation Ulcer Cleansing: Rinsed/Irrigated with Saline Topical Anesthetic Applied: Other: 02/15/2018, Electronic Signature(s) Signed: 12/18/2017 11:37:48 AM By: [name] Entered By: [name] on 12/18/2017 11:37:48 Hosley, Dezi Chauncey [name] ([id number]) -------------------------------------------------------------------------------- Wound Assessment Details Patient Name: CEDRICA, BRUNE S. Date of Service: 12/18/2017 10:30 AM Medical Record Number: [id number] Patient Account Number: [id number] Date of Birth/Sex: Sep 25, 1966 (52 y.o. Female) Treating RN: [name] Primary Care Alaric Gladwin: [name] Other Clinician: Referring Duvall Comes: Referral, Self Treating Juelz Whittenberg/Extender: [name] in Treatment: 0 Wound Status Wound Number: 7 Primary Pressure Ulcer Etiology: Wound Location: Right Gluteus Wound Open Wounding Event: Gradually Appeared Status: Date Acquired: 09/18/2016 Comorbid Anemia, Arrhythmia, Coronary Artery Disease, Weeks Of Treatment: 0 History: Hypotension, Peripheral Arterial Disease, Type Clustered Wound: No II Diabetes, Osteoarthritis Photos Photo Uploaded By: [name] on 12/19/2017 08:40:07 Wound Measurements Length: (cm) 1.9 Width: (cm) 1 Depth: (cm) 0.7 Area: (cm) 1.492 Volume: (cm) 1.045 % Reduction in Area: % Reduction in Volume: Tunneling: No Undermining: Yes Starting Position (o'clock): 12 Ending Position (o'clock): 6 Maximum Distance: (cm) 2.6 Wound Description Classification: Category/Stage IV Wound Margin: Distinct, outline attached Exudate Amount: Large Exudate Type: Serosanguineous Exudate Color: red, brown Foul Odor After Cleansing: No Slough/Fibrino Yes Wound  Bed Granulation Amount: Large (67-100%) Granulation Quality: Red Necrotic Amount: Small (1-33%) Necrotic Quality: Adherent Slough Periwound Skin Texture Stanwood, Jenipher S. ([id number]) Texture Color No Abnormalities Noted: No No Abnormalities Noted: No Scarring: Yes Temperature / Pain Moisture Temperature: No Abnormality No Abnormalities Noted: No Wound Preparation Ulcer Cleansing: Rinsed/Irrigated with Saline Topical Anesthetic Applied: Other: lidocaine 4%, Treatment Notes Wound #7 (Right Gluteus) 1. Cleansed with: Clean wound with Normal Saline 2. Anesthetic Topical Lidocaine 4% cream to wound bed prior to debridement 4. Dressing Applied: Prisma Ag 5. Secondary Dressing Applied Bordered Foam Dressing Electronic Signature(s) Signed: 12/18/2017 11:40:54 AM By: [name] Entered By: [name] on 12/18/2017 11:40:53 Nuzzo, Cynthia S. ([id number]) -------------------------------------------------------------------------------- Wound Assessment Details Patient Name: [name]. Date of Service: 12/18/2017 10:30 AM Medical Record Number: [id number] Patient Account Number: [id number] Date of Birth/Sex: 1966/01/31 (52 y.o. Female) Treating RN: [name] Primary Care Jaelyn Bourgoin: [name] Other Clinician: Referring Kennon Encinas: Referral, Self Treating Amarise Lillo/Extender: [name] in Treatment: 0 Wound Status Wound Number: 8 Primary Diabetic Wound/Ulcer of the Lower Extremity Etiology: Wound Location: Left Foot - Circumfernential Wound Open Wounding Event: Gradually Appeared Status: Date Acquired: 11/27/2017 Comorbid Anemia, Arrhythmia, Coronary Artery Disease, Weeks Of Treatment: 0 History: Hypotension, Peripheral Arterial Disease, Type Clustered Wound: No II Diabetes,  Osteoarthritis Pending Amputation On Presentation Photos Wound Measurements Length: (cm) 8 % Reductio Width: (cm) 16 % Reductio Depth: (cm) 0.1 Tunneling: Area: (cm)  100.531 Volume: (cm) 10.053 n in Area: 0% n in Volume: 0% No Wound Description Classification: Grade 2 Foul Odor Wound Margin: Distinct, outline attached Slough/Fib Exudate Amount: Medium Exudate Type: Purulent Exudate Color: yellow, brown, green [place], Emanuel S. ([id number]) After Cleansing: No rino Yes Wound Bed Granulation Amount: None Present (0%) Necrotic Amount: Large (67-100%) Necrotic Quality: Eschar, Adherent Slough Periwound Skin Texture Texture Color No Abnormalities Noted: No No Abnormalities Noted: No Moisture Temperature / Pain No Abnormalities Noted: No Temperature: No Abnormality Maceration: Yes Wound Preparation Ulcer Cleansing: Rinsed/Irrigated with Saline Topical Anesthetic Applied: Other: lidocaine 4%, Treatment Notes Wound #8 (Left, Circumferential Foot) 1. Cleansed with: Clean wound with Normal Saline 2. Anesthetic Topical Lidocaine 4% cream to wound bed prior to debridement 4. Dressing Applied: Other dressing (specify in notes) 5. Secondary Dressing Applied ABD Pad Kerlix/Conform 7. Secured with Tape Notes betadine to left 5th toe, silvercel Electronic Signature(s) Signed: 12/19/2017 8:42:42 AM By: [name] Previous Signature: 12/19/2017 8:42:14 AM Version By: [name] Previous Signature: 12/19/2017 8:41:48 AM Version By: [name] Previous Signature: 12/18/2017 11:42:35 AM Version By: [name] Entered By: [name] on 12/19/2017 08:42:41 Czarnecki, Rosealyn S. ([id number]) -------------------------------------------------------------------------------- Wound Assessment Details Patient Name: MAINOR, Marcee S. Date of Service: 12/18/2017 10:30 AM Medical Record Number: [id number] Patient Account Number: [id number] Date of Birth/Sex: 1966/01/31 (52 y.o. Female) Treating RN: [name] Primary Care Nissim Fleischer: [name] Other Clinician: Referring Daschel Roughton: Referral, Self Treating Garen Woolbright/Extender: [name] in Treatment: 0 Wound Status Wound Number: 9 Primary Diabetic Wound/Ulcer of the Lower Extremity Etiology: Wound Location: Left Toe Third Wound Open Wounding Event: Gradually Appeared Status: Date Acquired: 11/27/2017 Comorbid Anemia, Arrhythmia, Coronary Artery Disease, Weeks Of Treatment: 0 History: Hypotension, Peripheral Arterial Disease, Type Clustered Wound: No II Diabetes, Osteoarthritis Photos Photo Uploaded By: [name] on 12/19/2017 08:40:08 Wound Measurements Length: (cm) 0.5 Width: (cm) 0.9 Depth: (cm) 0.1 Area: (cm) 0.353 Volume: (cm) 0.035 % Reduction in Area: % Reduction in Volume: Epithelialization: None Tunneling: No Undermining: No Wound Description Classification: Grade 2 Wound Margin: Distinct, outline attached Exudate Amount: None Present Foul Odor After Cleansing: No Slough/Fibrino No Wound Bed Granulation Amount: None Present (0%) Necrotic Amount: Large (67-100%) Necrotic Quality: Eschar Periwound Skin Texture Texture Color No Abnormalities Noted: No No Abnormalities Noted: No Moisture Temperature / Pain No Abnormalities Noted: No Temperature: No Abnormality Wound Preparation Pellman, Shenelle S. ([id number]) Ulcer Cleansing: Rinsed/Irrigated with Saline Topical Anesthetic Applied: Other: lidocaine 4%, Electronic Signature(s) Signed: 12/19/2017 4:17:47 PM By: [name] Entered By: [name] on 12/18/2017 12:06:03 Cross, [place]. ([id number]) -------------------------------------------------------------------------------- [place] Details Patient Name: [name]. Date of Service: 12/18/2017 10:30 AM Medical Record Number: [id number] Patient Account Number: [id number] Date of Birth/Sex: October 14, 1966 (52 y.o. Female) Treating RN: [name] Primary Care Bonita Brindisi: [name] Other Clinician: Referring Square Jowett: Referral, Self Treating Desmond Szabo/Extender: [name] in Treatment: 0 Vital  Signs Time Taken: 10:46 Temperature (F): 97.8 Height (in): 67 Pulse (bpm): 91 Source: Stated Respiratory Rate (breaths/min): 18 Blood Pressure (mmHg): 82/57 Reference Range: 80 - 120 mg / dl Notes took BP manually and it was 82/58 Electronic Signature(s) Signed: 12/19/2017 4:17:47 PM By: [name] Entered By: [name] on 12/18/2017 10:49:49

## 2017-12-21 NOTE — Progress Notes (Signed)
LILINOE, ACKLIN ([id number]) Visit Report for 12/18/2017 Abuse/Suicide Risk Screen Details Patient Name: [name], [name]. Date of Service: 12/18/2017 10:30 AM Medical Record Number: [id number] Patient Account Number: [id number] Date of Birth/Sex: 1966/06/05 (52 y.o. Female) Treating RN: Carolyne Fiscal, Debi Primary Care Lanee Chain: Delight Stare Other Clinician: Referring Audelia Knape: Referral, Self Treating Marius Betts/Extender: Cathie Olden in Treatment: 0 Abuse/Suicide Risk Screen Items Answer ABUSE/SUICIDE RISK SCREEN: Has anyone close to you tried to hurt or harm you recentlyo No Do you feel uncomfortable with anyone in your familyo No Has anyone forced you do things that you didnot want to doo No Do you have any thoughts of harming yourselfo No Patient displays signs or symptoms of abuse and/or neglect. No Electronic Signature(s) Signed: 12/19/2017 4:17:47 PM By: Alric Quan Entered By: Alric Quan on 12/18/2017 10:54:49 Schmall, Jamariah S. ([id number]) -------------------------------------------------------------------------------- Activities of Daily Living Details Patient Name: [name]. Date of Service: 12/18/2017 10:30 AM Medical Record Number: [id number] Patient Account Number: [id number] Date of Birth/Sex: [birth year] (52 y.o. Female) Treating RN: Carolyne Fiscal, Debi Primary Care Philomina Leon: Delight Stare Other Clinician: Referring Yeudiel Mateo: Referral, Self Treating Presten Joost/Extender: Cathie Olden in Treatment: 0 Activities of Daily Living Items Answer Activities of Daily Living (Please select one for each item) Drive Automobile Not Able Take Medications Need Assistance Use Telephone Completely Able Care for Appearance Need Assistance Use Toilet Completely Able Bath / Shower Need Assistance Dress Self Need Assistance Feed Self Completely Able Walk Need Assistance Get In / Out Bed Need Assistance Housework Not Able Prepare Meals Not Able Handle Money Need  Assistance Shop for Self Need Assistance Electronic Signature(s) Signed: 12/19/2017 4:17:47 PM By: Alric Quan Entered By: Alric Quan on 12/18/2017 10:56:06 Culton, [name] ([id number]) -------------------------------------------------------------------------------- Education Assessment Details Patient Name: [name]. Date of Service: 12/18/2017 10:30 AM Medical Record Number: [id number] Patient Account Number: [id number] Date of Birth/Sex: [birth year] (52 y.o. Female) Treating RN: Carolyne Fiscal, Debi Primary Care Kaysee Hergert: Delight Stare Other Clinician: Referring Lynora Dymond: Referral, Self Treating Jeanene Mena/Extender: Cathie Olden in Treatment: 0 Primary Learner Assessed: Patient Learning Preferences/Education Level/Primary Language Learning Preference: Explanation, Printed Material Highest Education Level: Grade School Preferred Language: English Cognitive Barrier Assessment/Beliefs Language Barrier: No Translator Needed: No Memory Deficit: No Emotional Barrier: No Cultural/Religious Beliefs Affecting Medical Care: No Physical Barrier Assessment Impaired Vision: No Impaired Hearing: No Decreased Hand dexterity: No Knowledge/Comprehension Assessment Knowledge Level: Medium Comprehension Level: Medium Ability to understand written Medium instructions: Ability to understand verbal Medium instructions: Motivation Assessment Anxiety Level: Calm Cooperation: Cooperative Education Importance: Acknowledges Need Interest in Health Problems: Asks Questions Perception: Coherent Willingness to Engage in Self- Medium Management Activities: Readiness to Engage in Self- Medium Management Activities: Electronic Signature(s) Signed: 12/19/2017 4:17:47 PM By: Alric Quan Entered By: Alric Quan on 12/18/2017 10:56:27 Derocher, Chevelle Chauncey Cruel ([id number]) -------------------------------------------------------------------------------- Fall Risk Assessment  Details Patient Name: [name]. Date of Service: 12/18/2017 10:30 AM Medical Record Number: [id number] Patient Account Number: [id number] Date of Birth/Sex: 05/08/1966 (52 y.o. Female) Treating RN: Carolyne Fiscal, Debi Primary Care Louie Flenner: Delight Stare Other Clinician: Referring Alois Mincer: Referral, Self Treating Jalayne Ganesh/Extender: Cathie Olden in Treatment: 0 Fall Risk Assessment Items Have you had 2 or more falls in the last 12 monthso 0 No Have you had any fall that resulted in injury in the last 12 monthso 0 No FALL RISK ASSESSMENT: History of falling - immediate or within 3 months 0 No Secondary diagnosis 15 Yes Ambulatory aid None/bed rest/wheelchair/nurse 0 No Crutches/cane/walker 15 Yes Furniture 0 No IV Access/Saline  Lock 0 No Gait/Training Normal/bed rest/immobile 0 No Weak 0 No Impaired 20 Yes Mental Status Oriented to own ability 0 Yes Electronic Signature(s) Signed: 12/19/2017 4:17:47 PM By: Alric Quan Entered By: Alric Quan on 12/18/2017 10:56:54 Dortch, Jalexia S. ([id number]) -------------------------------------------------------------------------------- Foot Assessment Details Patient Name: [name]. Date of Service: 12/18/2017 10:30 AM Medical Record Number: [id number] Patient Account Number: [id number] Date of Birth/Sex: Jan 21, 1966 (52 y.o. Female) Treating RN: Carolyne Fiscal, Debi Primary Care Yousof Alderman: Delight Stare Other Clinician: Referring Esty Ahuja: Referral, Self Treating Bobbijo Holst/Extender: Cathie Olden in Treatment: 0 Foot Assessment Items Site Locations + = Sensation present, - = Sensation absent, C = Callus, U = Ulcer R = Redness, W = Warmth, M = Maceration, PU = Pre-ulcerative lesion F = Fissure, S = Swelling, D = Dryness Assessment Right: Left: Other Deformity: No No Prior Foot Ulcer: No No Prior Amputation: No No Charcot Joint: No No Ambulatory Status: Ambulatory With Help Assistance Device: Walker Gait:  Administrator, arts) Signed: 12/19/2017 4:17:47 PM By: Alric Quan Entered By: Alric Quan on 12/18/2017 10:58:20 Delbuono, Maurice S. ([id number]) -------------------------------------------------------------------------------- Nutrition Risk Assessment Details Patient Name: Seedorf, Johara S. Date of Service: 12/18/2017 10:30 AM Medical Record Number: [id number] Patient Account Number: [id number] Date of Birth/Sex: Mar 22, 1966 (52 y.o. Female) Treating RN: Carolyne Fiscal, Debi Primary Care Shaterica Mcclatchy: Delight Stare Other Clinician: Referring Devyne Hauger: Referral, Self Treating Chelsie Burel/Extender: Cathie Olden in Treatment: 0 Height (in): 67 Weight (lbs): Body Mass Index (BMI): Nutrition Risk Assessment Items NUTRITION RISK SCREEN: I have an illness or condition that made me change the kind and/or amount of 0 No food I eat I eat fewer than two meals per day 0 No I eat few fruits and vegetables, or milk products 0 No I have three or more drinks of beer, liquor or wine almost every day 0 No I have tooth or mouth problems that make it hard for me to eat 0 No I don't always have enough money to buy the food I need 0 No I eat alone most of the time 0 No I take three or more different prescribed or over-the-counter drugs a day 1 Yes Without wanting to, I have lost or gained 10 pounds in the last six months 0 No I am not always physically able to shop, cook and/or feed myself 2 Yes Nutrition Protocols Good Risk Protocol Provide education on Moderate Risk Protocol 0 nutrition Electronic Signature(s) Signed: 12/19/2017 4:17:47 PM By: Alric Quan Entered By: Alric Quan on 12/18/2017 10:57:45

## 2017-12-21 NOTE — Progress Notes (Addendum)
[name], [name] ([id number]) Visit Report for 12/18/2017 Chief Complaint Document Details Patient Name: [name], [name]. Date of Service: 12/18/2017 10:30 AM Medical Record Number: [id number] Patient Account Number: [id number] Date of Birth/Sex: 1965/12/31 (52 y.o. Female) Treating RN: Carolyne Fiscal, Debi Primary Care Provider: Delight Stare Other Clinician: Referring Provider: Referral, Self Treating Provider/Extender: Cathie Olden in Treatment: 0 Information Obtained from: Patient Chief Complaint She is here in follow up evaluation for multiple wounds Electronic Signature(s) Signed: 12/18/2017 5:46:58 PM By: Lawanda Cousins Entered By: Lawanda Cousins on 12/18/2017 13:12:40 Ketchem, [name] ([id number]) -------------------------------------------------------------------------------- Debridement Details Patient Name: [name]. Date of Service: 12/18/2017 10:30 AM Medical Record Number: [id number] Patient Account Number: [id number] Date of Birth/Sex: 27-Oct-1966 (52 y.o. Female) Treating RN: Carolyne Fiscal, Debi Primary Care Provider: Delight Stare Other Clinician: Referring Provider: Referral, Self Treating Provider/Extender: Cathie Olden in Treatment: 0 Debridement Performed for Wound #3 Right,Lateral Malleolus Assessment: Performed By: Physician Lawanda Cousins, NP Debridement: Debridement Severity of Tissue Pre Fat layer exposed Debridement: Pre-procedure Verification/Time Yes - 11:56 Out Taken: Start Time: 11:57 Pain Control: Lidocaine 4% Topical Solution Level: Skin/Subcutaneous Tissue Total Area Debrided (L x W): 0.9 (cm) x 1.2 (cm) = 1.08 (cm) Tissue and other material Viable, Non-Viable, Exudate, Fibrin/Slough, Subcutaneous debrided: Instrument: Curette Bleeding: Minimum Hemostasis Achieved: Pressure End Time: 11:59 Procedural Pain: 0 Post Procedural Pain: 0 Response to Treatment: Procedure was tolerated well Post Debridement Measurements of Total  Wound Length: (cm) 0.9 Width: (cm) 1.2 Depth: (cm) 0.3 Volume: (cm) 0.254 Character of Wound/Ulcer Post Debridement: Requires Further Debridement Severity of Tissue Post Debridement: Fat layer exposed Post Procedure Diagnosis Same as Pre-procedure Electronic Signature(s) Signed: 12/18/2017 5:46:58 PM By: Lawanda Cousins Signed: 12/19/2017 4:17:47 PM By: Alric Quan Entered By: Lawanda Cousins on 12/18/2017 13:12:13 Dudash, [name] ([id number]) -------------------------------------------------------------------------------- HPI Details Patient Name: [name]. Date of Service: 12/18/2017 10:30 AM Medical Record Number: [id number] Patient Account Number: [id number] Date of Birth/Sex: 1966-04-23 (52 y.o. Female) Treating RN: Carolyne Fiscal, Debi Primary Care Provider: Delight Stare Other Clinician: Referring Provider: Referral, Self Treating Provider/Extender: Cathie Olden in Treatment: 0 History of Present Illness HPI Description: 52 year old patient was sent to [place] from [facility] where she was seen by Dr. Sherril Cong for a left ankle ulceration and was recently hospitalized with hypotension and sepsis. She was treated with IV antibiotics and has been scheduled to see Dr. Sharol Given. She was seen by vascular surgery who recommended a femoral bypass but surgery has been delayed until her sacral wound from last year's necrotizing fasciitis has healed. Was seeing the wound care team at [facility] but wanted to change over. She is a smoker and smokes a pack of cigarettes a day The patient was recently admitted in [place] between February 2 and February 14. She had a follow-up to see vascular surgery, orthopedic surgery and infectious disease. During her admission she was known to have peripheral arterial disease, diabetes mellitus with neuropathy, chronic pain, open wound with necrotizing fasciitis of the sacral area which had been there since November 2017 Past medical history  significant for diabetes mellitus, ankle ulcer, sacral ulcer, necrotizing fasciitis, arterial occlusive disease, tobacco abuse. Review of the electronic medical records reveals that Dr. 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 An MRI showed edema in the bone which was consistent with osteomyelitis and the chronic ulcer and may need surgical intervention. She  also had a sacral decubitus ulcer which was treated with the wound VAC. On 01/07/2017 she was taken up by the vascular surgeon Dr. Trula Slade for an abdominal aortogram and bilateral lower extremity runoff, for a history of having bilateral femoropopliteal bypass graft as well as external iliac stenting on the right and stenting of her bypass graft. She had developed a nonhealing wound on her right ankle and there was a possibility of a femoral occlusion. the findings were noted and the impression was a surgical revascularization with a aorto bifemoral bypass graft. Both the femoropopliteal bypass grafts were patent. 01/31/2017 -- x-ray of the right hip and pelvis -- IMPRESSION:No radiographic evidence of acute osteomyelitis. Normal-appearing right hip joint space for age. No acute bony abnormality of the hip. 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 Since seeing her last week she has not had an appointment with infectious disease or the orthopedic specialist yet. 02/06/2017 -- the patient missed a couple of appointments yesterday due to the weather but other than that has apparently given up smoking for the last 5 days. 02/13/2017 -- she has rescheduled her infectious disease appointment and also the appointment with the orthopedic surgeon Dr. Sharol Given 03/06/2017 -- she was seen by Dr. Lucianne Lei dam of infectious disease on 03/05/2017 and he recommendedIV antibiotics but the patient  did not want to have that and he has given her Augmentin and doxycycline and will reevaluate her in 2 months time. 03/13/2017 -- he saw Dr. Trula Slade regarding her vascular issues and he would like to wait to the sacral wound is completely closed before considering aortobifemoral bypass graft. He will see her back in 2 months time. She was also seen by Dr. Sharol Given of orthopedics who recommended continue wound care dressings and follow in the office in about 2 months time. he also recommended 3 view radiographs of the right ankle at follow-up. 03/28/2017 -- she did have a PICC line placed and Dr. Lucianne Lei dam has begun IV vancomycin and ceftriaxone. she is going to continue this until she sees him in approximately a month's time 04/18/2017 -- we had applied for a skin substitute for the patient's care but her copayment is going to be about $300 a piece and the patient will not be able to afford this. 05/08/17 on evaluation today patient appears to potentially have a fungal rash in the periwound region of the sacral area. This also seems to extend into the inguinal creases and factional region as well. She is tender to palpation with light rubbing over the region of the periwound and the skin in this region does have a beefy red appearance. Everything seems to point to a fungal infection at this time. She has been tolerating the dressing changes up to this point. There is no evidence of infection otherwise. 05/16/2017 -- was seen by Dr. Rhina Brackett dam of infectious disease -- he saw for the chronic wound over her right ankle [place], [place]. ([id number]) with osteomyelitis of the fibula. He did not think that she is making progress and was not able to examine her sacral decubitus ulcer as the patient would not allow it. She was switched to Bactrim DS 2 tablets twice a day since finishing her antibiotics. Her ESR was 76 and a CRP was 2.8. He again discussed with her that she would need amputation to cure her  osteomyelitis of fibula but he would continue to try suppressive antibiotics. 05/23/17 on  evaluation today patient's wound appears to be doing about the same in regard to the ankle as well as the gluteal area. She apparently has issues with the one back staying in place and she states the sill seems to break up the inferior aspect. She does have home help coming out to apply the wound VAC. She has been tolerating the dressing changes without any problem although she has had some increased pain in the gluteal region due to a fall she sustained and she feels like she brews that area and she had pain actually radiating down her leg which is slowly beginning to improve. There's no evidence of infection. 05/29/17 Unfortunately on evaluation today patient's wounds both in regard to the ankle as well as in regard to her gluteal region on the right appear to be measuring the same as last week's evaluation. She also tells me that she is having soreness today in regard to the right gluteal area because she had a visitor who came to see her a couple days ago and stayed for six hours and she had to set up for that entire length of time visiting. This has called some increased discomfort. Normally she does not set up for those long periods of time. Fortunately there's no evidence of infection and no worsening of the wounds. 06/13/2017 -- the patient has had a lot of health issues including perfuse diarrhea and generalized weakness and I have instructed her to see her PCP as soon as possible and if things get worse to go to the ER. She has not brought her wound VAC today. She continues to be on oral antibiotics 06/30/17- she is here in follow up evaluation for right ischial and right lateral malleolus ulcer. She continues with negative pressure wound therapy to the ischial wound. She is currently on a diabetic therapy per infectious disease, although she does state that she inconsistently takes it. She was advised  to take her antibiotic as prescribed 07/11/2017 -- she has been unable to use a wound VAC regularly because she loses this evening and it has not been possible for her to use it. 07/18/2017 -- she is much more comfortable and looks better without the wound VAC and at this stage I believe it will be better for her to return the machine and continue with local care. 08/07/2017 -- she is back up to 3 weeks due to a vacation and during this time her wound VAC was returned. She has been doing local dressings 08/15/17 on evaluation today patient appears to be doing about the same in regard to her ankle as well as right gluteal wound. She has been tolerating the dressing changes and tells me that it's mainly her gluteal wound that hurts the ankle is nontender to palpation. She rates this pain to be a one out of 10 at rest and with cleansing of the wound five out of 10 at least. No fevers, chills, nausea, or vomiting noted at this time. She does note that home health has told her to inquire about possibly using silver nitrate on the wound edges due to the road nature of the edges. 08/29/2017 -- -- I got a note which Dr. Lucianne Lei dam sent to Dr. 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 He had recommended definitive surgery to fix this. He recommended to continue with Bactrim Right ankle x-ray -- IMPRESSION: Findings compatible with cellulitis of the lateral malleolar region. A small focus of  bony destruction involving the tip of the lateral malleolus is worrisome for osteomyelitis. 09/05/2017 -- the patient was seen by Dr. Meridee Score yesterday who thought her wound looked good and he would continue local care with silver alginate dressing and follow-up in 4 weeks. He did not recommend any surgical intervention of the ankle until there is good arterial flow and he would await Dr. Stephens Shire opinion regarding this. She was also  seen by infectious disease Dr. Lucianne Lei dam, who was concerned about her elevated potassium and took her off Bactrim. he has recommended doxycycline. 09/26/2017 -- she is back after 3 weeks as she was out of town on vacation. She has put on some weight but other than that no change in her health 10/24/2017 -- she was seen by Dr. [name] -- after a thorough review he plans a angiographically via the left femoral approach to evaluate her left subclavian artery and address any stenosis at that time. He will also perform bilateral runoff to confirm that a right femoropopliteal as well as a left femoropopliteal bypass graft remained patent. He is then considering a right axillary to femoral bypass graft for limb salvage. [name], [name] ([id number]) ==== 12/18/17-she is here in follow-up evaluation for multiple wounds. She is s/p aortobifemoral and left fem-pop revision. She presents with multiple necrotic areas to the left toes and lateral foot. The wounds we were originally seen her for (right heel and left buttock) are improved/stable. According to her husband the wounds to the left foot are "better" than they were upon discharge from the hospital last Tuesday. He states that Dr. Trula Slade saw her on same day of discharge. She has a follow-up with Dr. Trula Slade on Monday. There are dopplerable pulses to the left dorsalis pedis, posterior tibial, peroneal. We communicated with Dr. Stephens Shire nurse regarding today's findings, photos were sent to her email and the patient will maintain her appointment on Monday. We discussed with the patient and her husband to report to [facility] in [place] for any concerns, changes, new areas of necrosis, new areas or progressive ischemia/purple discoloration, pallor, new/increased pain. We will anticipate follow up next week Electronic Signature(s) Signed: 12/18/2017 5:46:58 PM By: Lawanda Cousins Entered By: Lawanda Cousins on 12/18/2017 16:46:50 Jiles, [name]  ([id number]) -------------------------------------------------------------------------------- Physician Orders Details Patient Name: [name]. Date of Service: 12/18/2017 10:30 AM Medical Record Number: [id number] Patient Account Number: [id number] Date of Birth/Sex: 23-Apr-1966 (52 y.o. Female) Treating RN: Carolyne Fiscal, Debi Primary Care Provider: Delight Stare Other Clinician: Referring Provider: Referral, Self Treating Provider/Extender: Cathie Olden in Treatment: 0 Verbal / Phone Orders: Yes Clinician: Pinkerton, Debi Read Back and Verified: Yes Diagnosis Coding Wound Cleansing Wound #3 Right,Lateral Malleolus o Clean wound with Normal Saline. Wound #4 Left,Lateral Calcaneus o Clean wound with Normal Saline. Wound #5 Right,Circumferential Toe Great o Clean wound with Normal Saline. Wound #6 Left,Circumferential Toe Fourth o Clean wound with Normal Saline. Wound #7 Right Gluteus o Clean wound with Normal Saline. Wound #8 Left,Circumferential Foot o Clean wound with Normal Saline. Wound #9 Left Toe Third o Clean wound with Normal Saline. Anesthetic (add to Medication List) Wound #3 Right,Lateral Malleolus o Topical Lidocaine 4% cream applied to wound bed prior to debridement (In Clinic Only). Wound #4 Left,Lateral Calcaneus o Topical Lidocaine 4% cream applied to wound bed prior to debridement (In Clinic Only). Wound #5 Right,Circumferential Toe Great o Topical Lidocaine 4% cream applied to wound bed prior to debridement (In Clinic Only). Wound #6 Left,Circumferential Toe Fourth   o Topical Lidocaine 4% cream applied to wound bed prior to debridement (In Clinic Only). Wound #7 Right Gluteus o Topical Lidocaine 4% cream applied to wound bed prior to debridement (In Clinic Only). Wound #8 Left,Circumferential Foot o Topical Lidocaine 4% cream applied to wound bed prior to debridement (In Clinic Only). Skin Barriers/Peri-Wound Care Wound #3  Right,Lateral Malleolus o Skin Prep Se[place]erry, Geraldine S. ([id number]) Wound #7 Right Gluteus o Skin Prep Primary Wound Dressing Wound #3 Right,Lateral Malleolus o Prisma Ag - moisten with saline Wound #4 Left,Lateral Calcaneus o Silvercel Non-Adherent - silver alginate Wound #5 Right,Circumferential Toe Great o Other: - betadine (paint on wound) Wound #6 Left,Circumferential Toe Fourth o Other: - betadine (paint on wound) Wound #7 Right Gluteus o Prisma Ag - moisten with saline Wound #8 Left,Circumferential Foot o Silvercel Non-Adherent - silver alginate o Other: - paint 5th toe with betadine Wound #9 Left Toe Third o Other: - betadine (paint on wound) Secondary Dressing Wound #3 Right,Lateral Malleolus o Boardered Foam Dressing Wound #4 Left,Lateral Calcaneus o ABD pad o Conform/Kerlix Wound #5 Right,Circumferential Toe Great o ABD pad o Conform/Kerlix Wound #6 Left,Circumferential Toe Fourth o ABD pad o Conform/Kerlix Wound #7 Right Gluteus o Boardered Foam Dressing Wound #8 Left,Circumferential Foot o ABD pad o Conform/Kerlix Wound #9 Left Toe Third o ABD pad o Conform/Kerlix Dressing Change Frequency Wound #3 Right,Lateral Malleolus Coale, Zoei S. ([id number]) o Change dressing every other day. o Other: - PRN Wound #4 Left,Lateral Calcaneus o Change dressing every other day. o Other: - PRN Wound #5 Right,Circumferential Toe Great o Change dressing every other day. o Other: - PRN Wound #6 Left,Circumferential Toe Fourth o Change dressing every other day. o Other: - PRN Wound #7 Right Gluteus o Change dressing every other day. o Other: - PRN Wound #8 Left,Circumferential Foot o Change dressing every other day. o Other: - PRN Wound #9 Left Toe Third o Change dressing every other day. o Other: - PRN Off-Loading Wound #3 Right,Lateral Malleolus o Turn and reposition every 2  hours Wound #4 Left,Lateral Calcaneus o Turn and reposition every 2 hours Wound #5 Right,Circumferential Toe Great o Turn and reposition every 2 hours Wound #6 Left,Circumferential Toe Fourth o Turn and reposition every 2 hours Wound #7 Right Gluteus o Turn and reposition every 2 hours Wound #8 Left,Circumferential Foot o Turn and reposition every 2 hours Wound #9 Left Toe Third o Turn and reposition every 2 hours Additional Orders / Instructions Wound #3 Right,Lateral Malleolus o Increase protein intake. Wound #4 Left,Lateral Calcaneus o Increase protein intake. Wound #5 Right,Circumferential Toe Tayja Manzer, Kenly S. ([id number]) o Increase protein intake. Wound #6 Left,Circumferential Toe Fourth o Increase protein intake. Wound #7 Right Gluteus o Increase protein intake. Wound #8 Left,Circumferential Foot o Increase protein intake. Wound #9 Left Toe Third o Increase protein intake. Home Health Wound #3 [place] Visits o Home Health Nurse may visit PRN to address patientos wound care needs. o FACE TO FACE ENCOUNTER: MEDICARE and MEDICAID PATIENTS: I certify that this patient is under my care and that I had a face-to-face encounter that meets the physician face-to-face encounter requirements with this patient on this date. The encounter with the patient was in whole or in part for the following MEDICAL CONDITION: (primary reason for [place]) MEDICAL NECESSITY: I certify, that based on my findings, NURSING services are a medically necessary home health service. HOME BOUND STATUS: I certify that my clinical findings support that this  patient is homebound (i.e., Due to illness or injury, pt requires aid of supportive devices such as crutches, cane, wheelchairs, walkers, the use of special transportation or the assistance of another person to leave their place of residence. There is a normal inability  to leave the home and doing so requires considerable and taxing effort. Other absences are for medical reasons / religious services and are infrequent or of short duration when for other reasons). o If current dressing causes regression in wound condition, may D/C ordered dressing product/s and apply Normal Saline Moist Dressing daily until next [place] / Other MD appointment. [place] of regression in wound condition at [phone]. o Please direct any NON-WOUND related issues/requests for orders to patient's Primary Care Physician Wound #4 [place] Nurse may visit PRN to address patientos wound care needs. o FACE TO FACE ENCOUNTER: MEDICARE and MEDICAID PATIENTS: I certify that this patient is under my care and that I had a face-to-face encounter that meets the physician face-to-face encounter requirements with this patient on this date. The encounter with the patient was in whole or in part for the following MEDICAL CONDITION: (primary reason for [place]) MEDICAL NECESSITY: I certify, that based on my findings, NURSING services are a medically necessary home health service. HOME BOUND STATUS: I certify that my clinical findings support that this patient is homebound (i.e., Due to illness or injury, pt requires aid of supportive devices such as crutches, cane, wheelchairs, walkers, the use of special transportation or the assistance of another person to leave their place of residence. There is a normal inability to leave the home and doing so requires considerable and taxing effort. Other absences are for medical reasons / religious services and are infrequent or of short duration when for other reasons). o If current dressing causes regression in wound condition, may D/C ordered dressing product/s and apply Normal Saline Moist Dressing daily until next [place] / Other MD  appointment. [place] of regression in wound condition at [phone]. o Please direct any NON-WOUND related issues/requests for orders to patient's Primary Care Physician Wound #5 Right,Circumferential Toe [place] Nurse may visit PRN to address patientos wound care needs. o FACE TO FACE ENCOUNTER: MEDICARE and MEDICAID PATIENTS: I certify that this patient is under my care and that I had a face-to-face encounter that meets the physician face-to-face encounter requirements with this patient on this date. The encounter with the patient was in whole or in part for the following MEDICAL CONDITION: (primary reason for [place]) MEDICAL NECESSITY: I certify, that based on my findings, DAFNA, ROMO ([id number]) NURSING services are a medically necessary home health service. HOME BOUND STATUS: I certify that my clinical findings support that this patient is homebound (i.e., Due to illness or injury, pt requires aid of supportive devices such as crutches, cane, wheelchairs, walkers, the use of special transportation or the assistance of another person to leave their place of residence. There is a normal inability to leave the home and doing so requires considerable and taxing effort. Other absences are for medical reasons / religious services and are infrequent or of short duration when for other reasons). o If current dressing causes regression in wound condition, may D/C ordered dressing product/s and apply Normal Saline Moist Dressing daily until next [place] / Other MD appointment. [place] of regression  in wound condition at [phone]. o Please direct any NON-WOUND related issues/requests for orders to patient's Primary Care Physician Wound #6 Left,Circumferential Toe [place] Nurse may visit PRN to address patientos wound care  needs. o FACE TO FACE ENCOUNTER: MEDICARE and MEDICAID PATIENTS: I certify that this patient is under my care and that I had a face-to-face encounter that meets the physician face-to-face encounter requirements with this patient on this date. The encounter with the patient was in whole or in part for the following MEDICAL CONDITION: (primary reason for [place]) MEDICAL NECESSITY: I certify, that based on my findings, NURSING services are a medically necessary home health service. HOME BOUND STATUS: I certify that my clinical findings support that this patient is homebound (i.e., Due to illness or injury, pt requires aid of supportive devices such as crutches, cane, wheelchairs, walkers, the use of special transportation or the assistance of another person to leave their place of residence. There is a normal inability to leave the home and doing so requires considerable and taxing effort. Other absences are for medical reasons / religious services and are infrequent or of short duration when for other reasons). o If current dressing causes regression in wound condition, may D/C ordered dressing product/s and apply Normal Saline Moist Dressing daily until next [place] / Other MD appointment. [place] of regression in wound condition at [phone]. o Please direct any NON-WOUND related issues/requests for orders to patient's Primary Care Physician Wound #7 Right [place] Nurse may visit PRN to address patientos wound care needs. o FACE TO FACE ENCOUNTER: MEDICARE and MEDICAID PATIENTS: I certify that this patient is under my care and that I had a face-to-face encounter that meets the physician face-to-face encounter requirements with this patient on this date. The encounter with the patient was in whole or in part for the following MEDICAL CONDITION: (primary reason for [place]) MEDICAL  NECESSITY: I certify, that based on my findings, NURSING services are a medically necessary home health service. HOME BOUND STATUS: I certify that my clinical findings support that this patient is homebound (i.e., Due to illness or injury, pt requires aid of supportive devices such as crutches, cane, wheelchairs, walkers, the use of special transportation or the assistance of another person to leave their place of residence. There is a normal inability to leave the home and doing so requires considerable and taxing effort. Other absences are for medical reasons / religious services and are infrequent or of short duration when for other reasons). o If current dressing causes regression in wound condition, may D/C ordered dressing product/s and apply Normal Saline Moist Dressing daily until next [place] / Other MD appointment. [place] of regression in wound condition at [phone]. o Please direct any NON-WOUND related issues/requests for orders to patient's Primary Care Physician Wound #8 [place] Visits o Home Health Nurse may visit PRN to address patientos wound care needs. o FACE TO FACE ENCOUNTER: MEDICARE and MEDICAID PATIENTS: I certify that this patient is under my care and that I had a face-to-face encounter that meets the physician face-to-face encounter requirements with this patient on this date. The encounter with the patient was in whole or in part for the following MEDICAL CONDITION: (primary reason for [place]) MEDICAL NECESSITY: I certify, that based on my findings, NURSING services are a medically necessary  home health service. HOME BOUND STATUS: I certify that my clinical findings support that this patient is homebound (i.e., Due to illness or injury, pt requires aid of supportive devices such as crutches, cane, wheelchairs, walkers, the use of special transportation or the assistance of  another person to leave their place of residence. There is a normal inability to leave the home Reffett, Stanley. ([id number]) and doing so requires considerable and taxing effort. Other absences are for medical reasons / religious services and are infrequent or of short duration when for other reasons). o If current dressing causes regression in wound condition, may D/C ordered dressing product/s and apply Normal Saline Moist Dressing daily until next [place] / Other MD appointment. [place] of regression in wound condition at [phone]. o Please direct any NON-WOUND related issues/requests for orders to patient's Primary Care Physician Wound #9 Left Toe [place] Nurse may visit PRN to address patientos wound care needs. o FACE TO FACE ENCOUNTER: MEDICARE and MEDICAID PATIENTS: I certify that this patient is under my care and that I had a face-to-face encounter that meets the physician face-to-face encounter requirements with this patient on this date. The encounter with the patient was in whole or in part for the following MEDICAL CONDITION: (primary reason for [place]) MEDICAL NECESSITY: I certify, that based on my findings, NURSING services are a medically necessary home health service. HOME BOUND STATUS: I certify that my clinical findings support that this patient is homebound (i.e., Due to illness or injury, pt requires aid of supportive devices such as crutches, cane, wheelchairs, walkers, the use of special transportation or the assistance of another person to leave their place of residence. There is a normal inability to leave the home and doing so requires considerable and taxing effort. Other absences are for medical reasons / religious services and are infrequent or of short duration when for other reasons). o If current dressing causes regression in wound condition, may D/C ordered  dressing product/s and apply Normal Saline Moist Dressing daily until next [place] / Other MD appointment. [place] of regression in wound condition at [phone]. o Please direct any NON-WOUND related issues/requests for orders to patient's Primary Care Physician Patient Medications Allergies: Documentation for allergies has not been submitted Notifications Medication Indication Start End lidocaine DOSE 1 - topical 4 % cream - 1 cream topical Electronic Signature(s) Signed: 12/18/2017 5:46:58 PM By: Lawanda Cousins Entered By: Lawanda Cousins on 12/18/2017 16:47:06 Omary, [name] ([id number]) -------------------------------------------------------------------------------- Prescription 12/18/2017 Patient Name: [name]. Provider: Lawanda Cousins NP Date of Birth: 04-30-1966 NPI#: [id number] Sex: F DEA#: [id number] Phone #: [phone] License #: Patient Address: [facility] [place], [place] [address] [address] [place], [place] [address] [phone] Allergies Medication Medication: Route: Strength: Form: lidocaine 4 % topical cream topical 4% cream Class: TOPICAL LOCAL ANESTHETICS Dose: Frequency / Time: Indication: 1 1 cream topical Number of Refills: Number of Units: 0 Generic Substitution: Start Date: End Date: One Time Use: Substitution Permitted No Note to Pharmacy: Signature(s): Date(s): Electronic Signature(s) Signed: 12/18/2017 5:46:58 PM By: Lawanda Cousins Entered By: Lawanda Cousins on 12/18/2017 16:47:06 Laflam, [name] ([id number]) --------------------------------------------------------------------------------  Problem List Details Patient Name: [name]. Date of Service: 12/18/2017 10:30 AM Medical Record Number: [id number] Patient Account Number: [id number] Date of Birth/Sex: July 07, 1966 (52 y.o. 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 ([id number]) -------------------------------------------------------------------------------- Progress Note Details Patient Name: [name]. Date of Service: 12/18/2017 10:30 AM Medical Record Number: [id number] Patient Account Number: [id number] Date of Birth/Sex: 09-03-1966 (52 y.o. Female) Treating RN: Carolyne Fiscal, Debi Primary Care Provider: Delight Stare Other Clinician: Referring Provider: Referral, Self Treating Provider/Extender: Cathie Olden in Treatment: 0 Subjective Chief Complaint Information obtained from Patient She is here in follow up evaluation for multiple wounds History of Present Illness (HPI) 52 year old patient was sent to [place] from [facility] where she was seen by Dr. Sherril Cong for a left ankle ulceration and was recently hospitalized with hypotension and sepsis. She was treated with IV antibiotics and has been scheduled to see Dr. Sharol Given. She was seen by vascular surgery who recommended a femoral bypass but surgery has been delayed until her sacral wound from last year's necrotizing  fasciitis has healed. Was seeing the wound care team at [facility] but wanted to change over. She is a smoker and smokes a pack of cigarettes a day The patient was recently admitted in [place] between February 2 and February 14. She had a follow-up to see vascular surgery, orthopedic surgery and infectious disease. 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 Review of the electronic medical records reveals that Dr. 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 An MRI showed edema in the bone which was consistent with osteomyelitis and the chronic ulcer and may need surgical intervention. She also had a sacral decubitus ulcer which was treated with the wound VAC. On 01/07/2017 she was taken up by the vascular surgeon Dr. Trula Slade for an abdominal aortogram and bilateral lower extremity runoff, for a history of having bilateral femoropopliteal bypass graft as well as external iliac stenting on the right and stenting of her bypass graft. She had developed a nonhealing wound on her right ankle and there was a possibility of a femoral occlusion. the findings were noted and the impression was a surgical revascularization with a aorto bifemoral bypass graft. Both the femoropopliteal bypass grafts were patent. 01/31/2017 -- x-ray of the right hip and pelvis -- IMPRESSION:No radiographic evidence of acute osteomyelitis. Normal-appearing right hip joint space for age. No acute bony abnormality of the hip. 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 Since seeing her last week she has not had an appointment with infectious disease or the orthopedic specialist yet. 02/06/2017 -- the patient missed  a couple of appointments yesterday due to the weather but other than that has apparently given up smoking for the last 5 days. 02/13/2017 -- she has rescheduled her infectious disease appointment and also the appointment with the orthopedic surgeon Dr. Sharol Given 03/06/2017 -- she was seen by Dr. Lucianne Lei dam of infectious disease on 03/05/2017 and he recommendedIV antibiotics but the patient did not want to have that and he has given her Augmentin and doxycycline and will reevaluate her in 2 months time. 03/13/2017 -- he saw Dr. Trula Slade regarding her vascular issues and he would like to wait to the sacral wound is completely closed before considering aortobifemoral bypass graft. He will see her back in 2 months time. She was also seen by Dr. Sharol Given of orthopedics who recommended continue wound care dressings and follow in the office in about 2 months time. he also recommended 3 view radiographs of the right ankle at follow-up. 03/28/2017 -- she did have a PICC line placed and Dr. Lucianne Lei dam has begun IV vancomycin and ceftriaxone. she is going to continue this until she sees him in approximately a month's time 04/18/2017 -- we had applied for a skin substitute for the patient's care but her copayment is going to be about $300 a piece and the patient will not be able to afford this. 05/08/17 on evaluation today patient appears to potentially have a fungal rash in the periwound region of the sacral area. This Uhlig, [name] ([id number]) also seems to extend into the inguinal creases and factional region as well. She is tender to palpation with light rubbing over the region of the periwound and the skin in this region does have a beefy red appearance. Everything seems to point to a fungal infection at this time. She has been tolerating the dressing  changes up to this point. There is no evidence of infection otherwise. 05/16/2017 -- was seen by Dr. 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 He did not think that she is making progress and was not able to examine her sacral decubitus ulcer as the patient would not allow it. She was switched to Bactrim DS 2 tablets twice a day since finishing her antibiotics. Her ESR was 76 and a CRP was 2.8. 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 05/23/17 on evaluation today patient's wound appears to be doing about the same in regard to the ankle as well as the gluteal area. She apparently has issues with the one back staying in place and she states the sill seems to break up the inferior aspect. She does have home help coming out to apply the wound VAC. She has been tolerating the dressing changes without any problem although she has had some increased pain in the gluteal region due to a fall she sustained and she feels like she brews that area and she had pain actually radiating down her leg which is slowly beginning to improve. There's no evidence of infection. 05/29/17 Unfortunately on evaluation today patient's wounds both in regard to the ankle as well as in regard to her gluteal region on the right appear to be measuring the same as last week's evaluation. She also tells me that she is having soreness today in regard to the right gluteal area because she had a visitor who came to see her a  couple days ago and stayed for six hours and she had to set up for that entire length of time visiting. This has called some increased discomfort. Normally she does not set up for those long periods of time. Fortunately there's no evidence of infection and no worsening of the wounds. 06/13/2017 -- the patient has had a lot of  health issues including perfuse diarrhea and generalized weakness and I have instructed her to see her PCP as soon as possible and if things get worse to go to the ER. She has not brought her wound VAC today. She continues to be on oral antibiotics 06/30/17- she is here in follow up evaluation for right ischial and right lateral malleolus ulcer. She continues with negative pressure wound therapy to the ischial wound. She is currently on a diabetic therapy per infectious disease, although she does state that she inconsistently takes it. She was advised to take her antibiotic as prescribed 07/11/2017 -- she has been unable to use a wound VAC regularly because she loses this evening and it has not been possible for her to use it. 07/18/2017 -- she is much more comfortable and looks better without the wound VAC and at this stage I believe it will be better for her to return the machine and continue with local care. 08/07/2017 -- she is back up to 3 weeks due to a vacation and during this time her wound VAC was returned. She has been doing local dressings 08/15/17 on evaluation today patient appears to be doing about the same in regard to her ankle as well as right gluteal wound. She has been tolerating the dressing changes and tells me that it's mainly her gluteal wound that hurts the ankle is nontender to palpation. She rates this pain to be a one out of 10 at rest and with cleansing of the wound five out of 10 at least. No fevers, chills, nausea, or vomiting noted at this time. She does note that home health has told her to inquire about possibly using silver nitrate on the wound edges due to the road nature of the edges. 08/29/2017 -- -- I got a note which Dr. Lucianne Lei dam sent to Dr. 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 He had recommended definitive surgery to fix this. He recommended to continue with Bactrim Right  ankle x-ray -- IMPRESSION: Findings compatible with cellulitis of the lateral malleolar region. A small focus of bony destruction involving the tip of the lateral malleolus is worrisome for osteomyelitis. 09/05/2017 -- the patient was seen by Dr. Meridee Score yesterday who thought her wound looked good and he would continue local care with silver alginate dressing and follow-up in 4 weeks. He did not recommend any surgical intervention of the ankle until there is good arterial flow and he would await Dr. Stephens Shire opinion regarding this. She was also seen by infectious disease Dr. Lucianne Lei dam, who was concerned about her elevated potassium and took her off Bactrim. he has recommended doxycycline. 09/26/2017 -- she is back after 3 weeks as she was out of town on vacation. She has put on some weight but other than that [name], [name]. ([id number]) no change in her health 10/24/2017 -- she was seen by Dr. [name] -- after a thorough review he plans a angiographically via the left femoral approach to evaluate her left subclavian artery and address any stenosis at that time. He will also perform bilateral runoff to  confirm that a right femoropopliteal as well as a left femoropopliteal bypass graft remained patent. He is then considering a right axillary to femoral bypass graft for limb salvage. ==== 12/18/17-she is here in follow-up evaluation for multiple wounds. She is s/p aortobifemoral and left fem-pop revision. She presents with multiple necrotic areas to the left toes and lateral foot. The wounds we were originally seen her for (right heel and left buttock) are improved/stable. According to her husband the wounds to the left foot are "better" than they were upon discharge from the hospital last Tuesday. He states that Dr. Trula Slade saw her on same day of discharge. She has a follow-up with Dr. Trula Slade on Monday. There are dopplerable pulses to the left dorsalis pedis, posterior tibial, peroneal.  We communicated with Dr. Stephens Shire nurse regarding today's findings, photos were sent to her email and the patient will maintain her appointment on Monday. We discussed with the patient and her husband to report to [facility] in [place] for any concerns, changes, new areas of necrosis, new areas or progressive ischemia/purple discoloration, pallor, new/increased pain. We will anticipate follow up next week Wound History Patient presents with 4 open wounds that have been present for approximately 1 month and other 7 yrs. Patient has been treating wounds in the following manner: gauze. Laboratory tests have not been performed in the last month. Patient reportedly has not tested positive for an antibiotic resistant organism. Patient reportedly has tested positive for osteomyelitis. Patient reportedly has had testing performed to evaluate circulation in the legs. Patient experiences the following problems associated with their wounds: swelling. Patient History Information obtained from Patient. Allergies No allergies have been documented for the patient Family History Diabetes - Mother,Father,Maternal Grandparents,Paternal Grandparents,Child, Heart Disease - Talpa Grandparents, Hereditary Spherocytosis - Mother, Hypertension - Mother,Siblings,Paternal Grandparents,Maternal Grandparents, Seizures - Maternal Grandparents,Paternal Grandparents, Thyroid Problems - Siblings, No family history of Cancer, Kidney Disease, Lung Disease, Stroke, Tuberculosis. Social History Current every day smoker, Marital Status - Married, Alcohol Use - Rarely, Drug Use - No History, Caffeine Use - Moderate. Medical History Cardiovascular Patient has history of Arrhythmia - a-fib Review of Systems (ROS) Cardiovascular artery bypass in abdomen Objective Loftus, Brantlee S. ([id number]) Constitutional Vitals Time Taken: 10:46 AM, Height: 67 in, Source: Stated, Temperature:  97.8 F, Pulse: 91 bpm, Respiratory Rate: 18 breaths/min, Blood Pressure: 82/57 mmHg. General Notes: took BP manually and it was 82/58 Integumentary (Hair, Skin) Wound #3 status is Open. Original cause of wound was Pressure Injury. The wound is located on the Right,Lateral Malleolus. The wound measures 0.9cm length x 1.2cm width x 0.2cm depth; 0.848cm^2 area and 0.17cm^3 volume. There is no tunneling or undermining noted. There is a large amount of serous drainage noted. The wound margin is distinct with the outline attached to the wound base. There is medium (34-66%) pink granulation within the wound bed. There is a medium (34-66%) amount of necrotic tissue within the wound bed including Adherent Slough. The periwound skin appearance exhibited: Maceration. Periwound temperature was noted as No Abnormality. Wound #4 status is Open. Original cause of wound was Blister. The wound is located on the Left,Lateral Calcaneus. The wound measures 3.7cm length x 5.6cm width x 0.2cm depth; 16.273cm^2 area and 3.255cm^3 volume. There is no tunneling or undermining noted. There is a large amount of serosanguineous drainage noted. The wound margin is distinct with the outline attached to the wound base. There is no granulation within the wound bed. There is a large (67-100%) amount of necrotic  tissue within the wound bed including Eschar and Adherent Slough. The periwound skin appearance exhibited: Maceration. Periwound temperature was noted as No Abnormality. The periwound has tenderness on palpation. Wound #5 status is Open. Original cause of wound was Gradually Appeared. The wound is located on the Right,Circumferential Toe Great. The wound measures 4.5cm length x 8.5cm width x 0.1cm depth; 30.041cm^2 area and 3.004cm^3 volume. There is no tunneling or undermining noted. There is a large amount of serosanguineous drainage noted. The wound margin is distinct with the outline attached to the wound base. There  is large (67-100%) red granulation within the wound bed. There is a small (1-33%) amount of necrotic tissue within the wound bed including Eschar and Adherent Slough. Periwound temperature was noted as No Abnormality. Wound #6 status is Open. Original cause of wound was Gradually Appeared. The wound is located on the Left,Circumferential Toe Fourth. The wound measures 2.4cm length x 2.5cm width x 0.1cm depth; 4.712cm^2 area and 0.471cm^3 volume. There is no tunneling or undermining noted. There is a large amount of serosanguineous drainage noted. The wound margin is distinct with the outline attached to the wound base. There is no granulation within the wound bed. There is a large (67-100%) amount of necrotic tissue within the wound bed including Eschar and Adherent Slough. Periwound temperature was noted as No Abnormality. Wound #7 status is Open. Original cause of wound was Gradually Appeared. The wound is located on the Right Gluteus. The wound measures 1.9cm length x 1cm width x 0.7cm depth; 1.492cm^2 area and 1.045cm^3 volume. There is no tunneling noted, however, there is undermining starting at 12:00 and ending at 6:00 with a maximum distance of 2.6cm. There is a large amount of serosanguineous drainage noted. The wound margin is distinct with the outline attached to the wound base. There is large (67-100%) red granulation within the wound bed. There is a small (1-33%) amount of necrotic tissue within the wound bed including Adherent Slough. The periwound skin appearance exhibited: Scarring. Periwound temperature was noted as No Abnormality. Wound #8 status is Open. Original cause of wound was Gradually Appeared. The wound is located on the Left,Circumferential Foot. The wound measures 8cm length x 16cm width x 0.1cm depth; 100.531cm^2 area and 10.053cm^3 volume. There is no tunneling noted. There is a medium amount of purulent drainage noted. The wound margin is distinct with the outline  attached to the wound base. There is no granulation within the wound bed. There is a large (67-100%) amount of necrotic tissue within the wound bed including Eschar and Adherent Slough. The periwound skin appearance exhibited: Maceration. Periwound temperature was noted as No Abnormality. Wound #9 status is Open. Original cause of wound was Gradually Appeared. The wound is located on the Left Toe Third. The wound measures 0.5cm length x 0.9cm width x 0.1cm depth; 0.353cm^2 area and 0.035cm^3 volume. There is no tunneling or undermining noted. There is a none present amount of drainage noted. The wound margin is distinct with the outline attached to the wound base. There is no granulation within the wound bed. There is a large (67-100%) amount of necrotic tissue within the wound bed including Eschar. Periwound temperature was noted as No Abnormality. 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, left leg S31.819S - Unspecified open wound of right buttock,  sequela L97.312 - Non-pressure chronic ulcer of right ankle with fat layer exposed -the areas of dry necrosis to the left fourth and fifth toe, extending into the lateral aspect of the foot do not appear salvageable -Area of dry gangrene to the left great toe is primarily to the lateral aspect, barely extending into the web space; this does not appear salvageable either -There is an area of deflated and reabsorbed blister to the medial aspect of the left foot which appears stable -There are blisters both intact and deflated on the lateral aspect of the left foot extending into the heel which appear to have a combined aspect of ischemic changes and pressure, these appear to be salvageable at  this point -Surgical incision to her abdomen, bilateral groin, left posterior leg, left medial leg all appear stable with no erythema or noted drainage -We spoke with Wells Guiles at Dr. 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 There was a Skin/Subcutaneous Tissue Debridement ([id number]) debridement with total area of 1.08 sq cm performed by Lawanda Cousins, NP. with the following instrument(s): Curette to remove Viable and Non-Viable tissue/material including Exudate, Fibrin/Slough, and Subcutaneous after achieving pain control using Lidocaine 4% Topical Solution. A time out was conducted at 11:56, prior to the start of the procedure. A Minimum amount of bleeding was controlled with Pressure. The procedure was tolerated well with a pain level of 0 throughout and a pain level of 0 following the procedure. Post Debridement Measurements: 0.9cm length x 1.2cm width x 0.3cm depth; 0.254cm^3 volume. Character of Wound/Ulcer Post Debridement requires further debridement. Severity of Tissue Post Debridement is: Fat layer exposed. Post procedure Diagnosis Wound #3: Same as Pre-Procedure Plan Wound Cleansing: Wound #3 Right,Lateral Malleolus: Ganger, Thersea S. ([id number]) Clean wound with Normal Saline. Wound #4 Left,Lateral Calcaneus: Clean wound with Normal Saline. Wound #5 Right,Circumferential Toe Great: Clean wound with Normal Saline. Wound #6 Left,Circumferential Toe Fourth: Clean wound with Normal Saline. Wound #7 Right Gluteus: Clean wound with Normal Saline. Wound #8 Left,Circumferential Foot: Clean wound  with Normal Saline. Wound #9 Left Toe Third: Clean wound with Normal Saline. Anesthetic (add to Medication List): Wound #3 Right,Lateral Malleolus: Topical Lidocaine 4% cream applied to wound bed prior to debridement (In Clinic Only). Wound #4 Left,Lateral Calcaneus: Topical Lidocaine 4% cream applied to wound bed prior to debridement (In Clinic Only). Wound #5 Right,Circumferential Toe Great: Topical Lidocaine 4% cream applied to wound bed prior to debridement (In Clinic Only). Wound #6 Left,Circumferential Toe Fourth: Topical Lidocaine 4% cream applied to wound bed prior to debridement (In Clinic Only). Wound #7 Right Gluteus: Topical Lidocaine 4% cream applied to wound bed prior to debridement (In Clinic Only). Wound #8 Left,Circumferential Foot: Topical Lidocaine 4% cream applied to wound bed prior to debridement (In Clinic Only). Skin Barriers/Peri-Wound Care: Wound #3 Right,Lateral Malleolus: Skin Prep Wound #7 Right Gluteus: Skin Prep Primary Wound Dressing: Wound #3 Right,Lateral Malleolus: Prisma Ag - moisten with saline Wound #4 Left,Lateral Calcaneus: Silvercel Non-Adherent - silver alginate Wound #5 Right,Circumferential Toe Great: Other: - betadine (paint on wound) Wound #6 Left,Circumferential Toe Fourth: Other: - betadine (paint on wound) Wound #7 Right Gluteus: Prisma Ag - moisten with saline Wound #  8 Left,Circumferential Foot: Silvercel Non-Adherent - silver alginate Other: - paint 5th toe with betadine Wound #9 Left Toe Third: Other: - betadine (paint on wound) Secondary Dressing: Wound #3 Right,Lateral Malleolus: Boardered Foam Dressing Wound #4 Left,Lateral Calcaneus: ABD pad Conform/Kerlix Wound #5 Right,Circumferential Toe Great: ABD pad Conform/Kerlix Wound #6 Left,Circumferential Toe Fourth: ABD pad Conform/Kerlix Wound #7 Right Gluteus: Lemieux, Marinell S. ([id number]) Boardered Foam Dressing Wound #8 Left,Circumferential Foot: ABD  pad Conform/Kerlix Wound #9 Left Toe Third: ABD pad Conform/Kerlix Dressing Change Frequency: Wound #3 Right,Lateral Malleolus: Change dressing every other day. Other: - PRN Wound #4 Left,Lateral Calcaneus: Change dressing every other day. Other: - PRN Wound #5 Right,Circumferential Toe Great: Change dressing every other day. Other: - PRN Wound #6 Left,Circumferential Toe Fourth: Change dressing every other day. Other: - PRN Wound #7 Right Gluteus: Change dressing every other day. Other: - PRN Wound #8 Left,Circumferential Foot: Change dressing every other day. Other: - PRN Wound #9 Left Toe Third: Change dressing every other day. Other: - PRN Off-Loading: Wound #3 Right,Lateral Malleolus: Turn and reposition every 2 hours Wound #4 Left,Lateral Calcaneus: Turn and reposition every 2 hours Wound #5 Right,Circumferential Toe Great: Turn and reposition every 2 hours Wound #6 Left,Circumferential Toe Fourth: Turn and reposition every 2 hours Wound #7 Right Gluteus: Turn and reposition every 2 hours Wound #8 Left,Circumferential Foot: Turn and reposition every 2 hours Wound #9 Left Toe Third: Turn and reposition every 2 hours Additional Orders / Instructions: Wound #3 Right,Lateral Malleolus: Increase protein intake. Wound #4 Left,Lateral Calcaneus: Increase protein intake. Wound #5 Right,Circumferential Toe Great: Increase protein intake. Wound #6 Left,Circumferential Toe Fourth: Increase protein intake. Wound #7 Right Gluteus: Increase protein intake. Wound #8 Left,Circumferential Foot: Increase protein intake. Wound #9 Left Toe Third: Increase protein intake. Home Health: ADRAIN, BUTRICK ([id number]) Wound #3 Right,Lateral Malleolus: [place] Nurse may visit PRN to address patient s wound care needs. FACE TO FACE ENCOUNTER: MEDICARE and MEDICAID PATIENTS: I certify that this patient is under my care and that I had a  face-to-face encounter that meets the physician face-to-face encounter requirements with this patient on this date. The encounter with the patient was in whole or in part for the following MEDICAL CONDITION: (primary reason for [place]) MEDICAL NECESSITY: I certify, that based on my findings, NURSING services are a medically necessary home health service. HOME BOUND STATUS: I certify that my clinical findings support that this patient is homebound (i.e., Due to illness or injury, pt requires aid of supportive devices such as crutches, cane, wheelchairs, walkers, the use of special transportation or the assistance of another person to leave their place of residence. There is a normal inability to leave the home and doing so requires considerable and taxing effort. Other absences are for medical reasons / religious services and are infrequent or of short duration when for other reasons). If current dressing causes regression in wound condition, may D/C ordered dressing product/s and apply Normal Saline Moist Dressing daily until next [place] / Other MD appointment. [place] of regression in wound condition at [phone]. Please direct any NON-WOUND related issues/requests for orders to patient's Primary Care Physician Wound #4 Left,Lateral Calcaneus: [place] Nurse may visit PRN to address patient s wound care needs. FACE TO FACE ENCOUNTER: MEDICARE and MEDICAID PATIENTS: I certify that this patient is under my care and that I had a face-to-face encounter that meets the physician face-to-face encounter  requirements with this patient on this date. The encounter with the patient was in whole or in part for the following MEDICAL CONDITION: (primary reason for [place]) MEDICAL NECESSITY: I certify, that based on my findings, NURSING services are a medically necessary home health service. HOME BOUND STATUS: I certify that my  clinical findings support that this patient is homebound (i.e., Due to illness or injury, pt requires aid of supportive devices such as crutches, cane, wheelchairs, walkers, the use of special transportation or the assistance of another person to leave their place of residence. There is a normal inability to leave the home and doing so requires considerable and taxing effort. Other absences are for medical reasons / religious services and are infrequent or of short duration when for other reasons). If current dressing causes regression in wound condition, may D/C ordered dressing product/s and apply Normal Saline Moist Dressing daily until next [place] / Other MD appointment. [place] of regression in wound condition at [phone]. Please direct any NON-WOUND related issues/requests for orders to patient's Primary Care Physician Wound #5 Right,Circumferential Toe Great: [place] Nurse may visit PRN to address patient s wound care needs. FACE TO FACE ENCOUNTER: MEDICARE and MEDICAID PATIENTS: I certify that this patient is under my care and that I had a face-to-face encounter that meets the physician face-to-face encounter requirements with this patient on this date. The encounter with the patient was in whole or in part for the following MEDICAL CONDITION: (primary reason for [place]) MEDICAL NECESSITY: I certify, that based on my findings, NURSING services are a medically necessary home health service. HOME BOUND STATUS: I certify that my clinical findings support that this patient is homebound (i.e., Due to illness or injury, pt requires aid of supportive devices such as crutches, cane, wheelchairs, walkers, the use of special transportation or the assistance of another person to leave their place of residence. There is a normal inability to leave the home and doing so requires considerable and taxing effort. Other absences  are for medical reasons / religious services and are infrequent or of short duration when for other reasons). If current dressing causes regression in wound condition, may D/C ordered dressing product/s and apply Normal Saline Moist Dressing daily until next [place] / Other MD appointment. [place] of regression in wound condition at [phone]. Please direct any NON-WOUND related issues/requests for orders to patient's Primary Care Physician Wound #6 Left,Circumferential Toe Fourth: [place] Nurse may visit PRN to address patient s wound care needs. FACE TO FACE ENCOUNTER: MEDICARE and MEDICAID PATIENTS: I certify that this patient is under my care and that I had a face-to-face encounter that meets the physician face-to-face encounter requirements with this patient on this date. The encounter with the patient was in whole or in part for the following MEDICAL CONDITION: (primary reason for [place]) MEDICAL NECESSITY: I certify, that based on my findings, NURSING services are a medically necessary home health service. HOME BOUND STATUS: I certify that my clinical findings support that this patient is homebound (i.e., Due to illness or injury, pt requires aid of supportive devices such as crutches, cane, wheelchairs, walkers, the use of special transportation or the assistance of another person to leave their place of residence. There is a normal inability to leave the home and doing so requires considerable and taxing effort. Other absences are for medical reasons / religious services  and are infrequent or of short duration when for other reasons). [name], [name] ([id number]) If current dressing causes regression in wound condition, may D/C ordered dressing product/s and apply Normal Saline Moist Dressing daily until next [place] / Other MD appointment. [place] of regression in wound  condition at [phone]. Please direct any NON-WOUND related issues/requests for orders to patient's Primary Care Physician Wound #7 Right Gluteus: [place] Nurse may visit PRN to address patient s wound care needs. FACE TO FACE ENCOUNTER: MEDICARE and MEDICAID PATIENTS: I certify that this patient is under my care and that I had a face-to-face encounter that meets the physician face-to-face encounter requirements with this patient on this date. The encounter with the patient was in whole or in part for the following MEDICAL CONDITION: (primary reason for [place]) MEDICAL NECESSITY: I certify, that based on my findings, NURSING services are a medically necessary home health service. HOME BOUND STATUS: I certify that my clinical findings support that this patient is homebound (i.e., Due to illness or injury, pt requires aid of supportive devices such as crutches, cane, wheelchairs, walkers, the use of special transportation or the assistance of another person to leave their place of residence. There is a normal inability to leave the home and doing so requires considerable and taxing effort. Other absences are for medical reasons / religious services and are infrequent or of short duration when for other reasons). If current dressing causes regression in wound condition, may D/C ordered dressing product/s and apply Normal Saline Moist Dressing daily until next [place] / Other MD appointment. [place] of regression in wound condition at [phone]. Please direct any NON-WOUND related issues/requests for orders to patient's Primary Care Physician Wound #8 Left,Circumferential Foot: [place] Nurse may visit PRN to address patient s wound care needs. FACE TO FACE ENCOUNTER: MEDICARE and MEDICAID PATIENTS: I certify that this patient is under my care and that I had a face-to-face encounter that  meets the physician face-to-face encounter requirements with this patient on this date. The encounter with the patient was in whole or in part for the following MEDICAL CONDITION: (primary reason for [place]) MEDICAL NECESSITY: I certify, that based on my findings, NURSING services are a medically necessary home health service. HOME BOUND STATUS: I certify that my clinical findings support that this patient is homebound (i.e., Due to illness or injury, pt requires aid of supportive devices such as crutches, cane, wheelchairs, walkers, the use of special transportation or the assistance of another person to leave their place of residence. There is a normal inability to leave the home and doing so requires considerable and taxing effort. Other absences are for medical reasons / religious services and are infrequent or of short duration when for other reasons). If current dressing causes regression in wound condition, may D/C ordered dressing product/s and apply Normal Saline Moist Dressing daily until next [place] / Other MD appointment. [place] of regression in wound condition at [phone]. Please direct any NON-WOUND related issues/requests for orders to patient's Primary Care Physician Wound #9 Left Toe Third: [place] Nurse may visit PRN to address patient s wound care needs. FACE TO FACE ENCOUNTER: MEDICARE and MEDICAID PATIENTS: I certify that this patient is under my care and that I had a face-to-face encounter that meets the physician face-to-face encounter requirements with this patient  on this date. The encounter with the patient was in whole or in part for the following MEDICAL CONDITION: (primary reason for [place]) MEDICAL NECESSITY: I certify, that based on my findings, NURSING services are a medically necessary home health service. HOME BOUND STATUS: I certify that my clinical findings support that this  patient is homebound (i.e., Due to illness or injury, pt requires aid of supportive devices such as crutches, cane, wheelchairs, walkers, the use of special transportation or the assistance of another person to leave their place of residence. There is a normal inability to leave the home and doing so requires considerable and taxing effort. Other absences are for medical reasons / religious services and are infrequent or of short duration when for other reasons). If current dressing causes regression in wound condition, may D/C ordered dressing product/s and apply Normal Saline Moist Dressing daily until next [place] / Other MD appointment. [place] of regression in wound condition at [phone]. Please direct any NON-WOUND related issues/requests for orders to patient's Primary Care Physician The following medication(s) was prescribed: lidocaine topical 4 % cream 1 1 cream topical was prescribed at facility [name], [name]. ([id number]) 1. Betadine daily to the areas of dry gangrene 2. Prisma into the right malleolus and left buttock 3. Silver alginate to the left lateral and posterior heel 4. Maintain appointment with Dr. Trula Slade on Monday 5. Report to [facility]) emergency department with any deterioration or ischemic changes to the left foot/leg 6. Anticipate follow-up next week Electronic Signature(s) Signed: 12/25/2017 1:19:56 PM By: Lawanda Cousins Previous Signature: 12/18/2017 5:46:58 PM Version By: Lawanda Cousins Entered By: Lawanda Cousins on 12/25/2017 13:19:55 Caulder, [name] ([id number]) -------------------------------------------------------------------------------- ROS/PFSH Details Patient Name: [name]. Date of Service: 12/18/2017 10:30 AM Medical Record Number: [id number] Patient Account Number: [id number] Date of Birth/Sex: [birth year] (52 y.o. Female) Treating RN: Carolyne Fiscal, Debi Primary Care Provider: Delight Stare Other  Clinician: Referring Provider: Referral, Self Treating Provider/Extender: Cathie Olden in Treatment: 0 Information Obtained From Patient Wound History Do you currently have one or more open woundso Yes How many open wounds do you currently haveo 4 Approximately how long have you had your woundso 1 month and other 7 yrs How have you been treating your wound(s) until nowo gauze Has your wound(s) ever healed and then re-openedo No Have you had any lab work done in the past montho No Have you tested positive for an antibiotic resistant organism (MRSA, VRE)o No Have you tested positive for osteomyelitis (bone infection)o Yes Have you had any tests for circulation on your legso Yes Have you had other problems associated with your woundso Swelling Eyes Medical History: Negative for: Cataracts; Glaucoma; Optic Neuritis Ear/Nose/Mouth/Throat Medical History: Negative for: Chronic sinus problems/congestion; Middle ear problems Hematologic/Lymphatic Medical History: Positive for: Anemia Negative for: Hemophilia; Human Immunodeficiency Virus; Lymphedema; Sickle Cell Disease Respiratory Medical History: Negative for: Aspiration; Asthma; Chronic Obstructive Pulmonary Disease (COPD); Pneumothorax; Sleep Apnea; Tuberculosis Cardiovascular Complaints and Symptoms: Review of System Notes: artery bypass in abdomen Medical History: Positive for: Arrhythmia - a-fib; Coronary Artery Disease; Hypotension; Peripheral Arterial Disease Gastrointestinal Medical History: Negative for: Cirrhosis ; Colitis; Crohnos; Hepatitis A; Hepatitis B; Hepatitis C Limbert, Adama S. ([id number]) Endocrine Medical History: Positive for: Type II Diabetes Treated with: Insulin, Oral agents Blood sugar tested every day: Yes Tested : 4 times a day Blood sugar testing results: Bedtime: 190 Genitourinary Medical History: Negative for: End Stage Renal Disease Immunological Medical History: Negative for:  Lupus Erythematosus; Raynaudos; Scleroderma Integumentary (Skin) Medical History: Negative for: History of Burn; History of pressure wounds Musculoskeletal Medical History: Positive for: Osteoarthritis Neurologic Medical History: Negative for: Dementia; Neuropathy; Quadriplegia; Paraplegia; Seizure Disorder Oncologic Medical History: Negative for: Received Chemotherapy; Received Radiation Psychiatric Medical History: Negative for: Anorexia/bulimia; Confinement Anxiety Immunizations Pneumococcal Vaccine: Received Pneumococcal Vaccination: Yes Implantable Devices Family and Social History Cancer: No; Diabetes: Yes - Mother,Father,Maternal Grandparents,Paternal Hernandez; Heart Disease: Yes - Father,Mother,Maternal Grandparents,Paternal Grandparents; Hereditary Spherocytosis: Yes - Mother; Hypertension: Yes - Mother,Siblings,Paternal Grandparents,Maternal Grandparents; Kidney Disease: No; Lung Disease: No; Seizures: Yes - Maternal Grandparents,Paternal Grandparents; Stroke: No; Thyroid Problems: Yes - Siblings; Tuberculosis: No; Current every day smoker; Marital Status - Married; Alcohol Use: Rarely; Drug Use: No History; Caffeine Use: Moderate; Financial Concerns: No; Food, Clothing or Shelter Needs: No; Support System Lacking: No; Transportation Concerns: No; Advanced Egge, Boyd. ([id number]) Directives: No; Patient does not want information on Advanced Directives; Do not resuscitate: No; Living Will: No; Medical Power of Attorney: No Electronic Signature(s) Signed: 12/18/2017 5:46:58 PM By: Lawanda Cousins Signed: 12/19/2017 4:17:47 PM By: Alric Quan Entered By: Alric Quan on 12/18/2017 10:54:43 Petsch, [name] ([id number]) -------------------------------------------------------------------------------- SuperBill Details Patient Name: WEHRMAN, [name]. Date of Service: 12/18/2017 Medical Record Number: [id number] Patient Account Number: [id number] Date of  Birth/Sex: 1966/03/14 (52 y.o. 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, left leg S31.819S Unspecified open wound of right buttock, sequela L97.312 Non-pressure chronic ulcer of right ankle with fat layer exposed Facility Procedures CPT4: Description Modifier Quantity Code [id number] - WOUND CARE VISIT-LEV 3 EST PT 1 CPT4: 81829937 11042 - DEB SUBQ TISSUE 20 SQ CM/< 1 ICD-10 Diagnosis Description L97.312 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, left leg I70.393 Other  atherosclerosis of unspecified type of bypass graft(s) of the extremities, bilateral legs Physician Procedures CPT4: Description Modifier Quantity Code 1696789 38101 - WC PHYS LEVEL 3 - EST PT 1 ICD-10 Diagnosis Description S31.819S Unspecified open wound of right buttock, sequela I70.393 Other atherosclerosis of unspecified type of bypass graft(s) of the  extremities, bilateral legs 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 ([id number]) Entered By:  Lawanda Cousins on 12/18/2017 16:54:05

## 2017-12-22 ENCOUNTER — Encounter: Payer: Self-pay | Admitting: Surgery

## 2017-12-22 ENCOUNTER — Ambulatory Visit (INDEPENDENT_AMBULATORY_CARE_PROVIDER_SITE_OTHER): Payer: Medicare Other | Admitting: Surgery

## 2017-12-22 VITALS — BP 104/67 | HR 89 | Temp 98.0°F | Resp 89 | Ht 67.0 in | Wt 204.0 lb

## 2017-12-22 DIAGNOSIS — I7025 Atherosclerosis of native arteries of other extremities with ulceration: Secondary | ICD-10-CM

## 2017-12-22 DIAGNOSIS — R110 Nausea: Secondary | ICD-10-CM

## 2017-12-22 MED ORDER — ONDANSETRON 4 MG PO TBDP
4.0000 mg | ORAL_TABLET | Freq: Three times a day (TID) | ORAL | 0 refills | Status: DC | PRN
Start: 2017-12-22 — End: 2018-04-02

## 2017-12-22 MED ORDER — ONDANSETRON 4 MG PO TBDP: 4.0000 mg | ORAL_TABLET | Freq: Three times a day (TID) | ORAL | 0 refills | Status: DC | PRN

## 2017-12-22 NOTE — Progress Notes (Signed)
Patient name: [name] MRN: [id number] DOB: [birth year] Sex: female  REASON FOR VISIT:     post op  HISTORY OF PRESENT ILLNESS:   [name] a 52y.o.femalewho presents for follow-up.  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 She has had multiple interventions for recurrent stenosis. She has also undergone balloon angioplasty of her right subclavian artery for upper extremity numbness. 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 She has known osteomyelitis. She is on prolonged antibiotics and has a wound VAC in place. While in the hospital in February 2018 she had a wound on her right foot. 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  On 11/28/2017 she underwent an aortobifemoral bypass graft with reimplantation of the inferior mesenteric artery as well as thrombectomy of bilateral femoral below-knee popliteal bypass grafts.  She had to go back to the operating room later that night for left popliteal and tibioperoneal trunk endarterectomy with patch angioplasty using left lesser saphenous vein.  She developed some areas of hypoperfusion which have led to a dark eschar on her left foot.  She is complaining of nausea as well as diarrhea which have been present for a long time.  CURRENT MEDICATIONS:    Current Outpatient Medications  Medication Sig Dispense Refill  . albuterol (PROVENTIL) 2 MG tablet Take 2 mg by mouth 3 (three) times daily as needed for shortness of breath.     . cetirizine (ZYRTEC) 10 MG tablet Take 10 mg by mouth daily as needed for allergies.     Marland Kitchen doxycycline (VIBRA-TABS) 100 MG tablet Take 1 tablet (100 mg total) by mouth 2 (two) times daily. 60 tablet 5  . HUMALOG KWIKPEN 100 UNIT/ML KiwkPen Inject 10 Units into the skin See admin instructions. 10 units three times a day before meals per sliding scale    . LANTUS  SOLOSTAR 100 UNIT/ML Solostar Pen Inject 36 Units into the skin 2 (two) times daily.     Marland Kitchen omeprazole (PRILOSEC) 40 MG capsule Take 40 mg by mouth daily.    . ondansetron (ZOFRAN ODT) 4 MG disintegrating tablet Take 1 tablet (4 mg total) by mouth every 8 (eight) hours as needed for nausea or vomiting. 20 tablet 0  . oxyCODONE-acetaminophen (PERCOCET/ROXICET) 5-325 MG tablet Take 1-2 tablets by mouth every 4 (four) hours as needed for moderate pain. 30 tablet 0  . pregabalin (LYRICA) 100 MG capsule Limit 1 tab by mouth twice a day to 3 times a day if tolerated (Patient taking differently: Take 100 mg by mouth 3 (three) times daily. ) 90 capsule 0  . rivaroxaban (XARELTO) 20 MG TABS tablet Take 1 tablet (20 mg total) by mouth daily with supper. 30 tablet 2  . rosuvastatin (CRESTOR) 20 MG tablet Take 1 tablet (20 mg total) by mouth daily at 6 PM. 30 tablet 2  . dicyclomine (BENTYL) 10 MG capsule Take 10 mg by mouth 4 (four) times daily as needed for spasms.    . ondansetron (ZOFRAN ODT) 4 MG disintegrating tablet Take 1 tablet (4 mg total) by mouth every 8 (eight) hours as needed for nausea or vomiting. 20 tablet 0   No current facility-administered medications for this visit.     REVIEW OF SYSTEMS:   [X]  denotes positive finding, [ ]  denotes negative finding Cardiac  Comments:  Chest pain or chest pressure:    Shortness of breath upon exertion:    Short  of breath when lying flat:    Irregular heart rhythm:    Constitutional    Fever or chills:      PHYSICAL EXAM:   Vitals:   12/22/17 1400  BP: 104/67  Pulse: 89  Resp: (!) 89  Temp: 98 F (36.7 C)  TempSrc: Oral  SpO2: 98%  Weight: 204 lb (92.5 kg)  Height: 5\' 7"  (1.702 m)    GENERAL: The patient is a well-nourished female, in no acute distress. The vital signs are documented above. CARDIOVASCULAR: There is a regular rate and rhythm. PULMONARY: Non-labored respirations Midline incision is healing nicely without evidence of  hernia.  Bilateral femoral incisions are healing nicely.  Left leg incision is healing properly. The patient has a dark dry eschar on her left fourth and fifth toe as well as the left great toe.  The ulcer on the right lateral malleolus appears to be smaller  STUDIES:   None   MEDICAL ISSUES:   Overall the patient is making remarkable progress.  She is recovering well from her operation.  It appears that the ulcer on her right leg is getting smaller.  She does have issues with several toes on the left which will likely end up requiring amputation.  Currently, I am going to allow these to continue to demarcate.  She is going to follow-up with me in 3 weeks.  I will repeat her ABIs and duplex and make a decision regarding amputation.  [name], MD [facility] [phone] Pager [phone]

## 2017-12-30 ENCOUNTER — Encounter: Payer: Medicare Other | Attending: Physician Assistant | Admitting: Physician Assistant

## 2017-12-30 DIAGNOSIS — L97312 Non-pressure chronic ulcer of right ankle with fat layer exposed: Secondary | ICD-10-CM | POA: Diagnosis not present

## 2017-12-30 DIAGNOSIS — Z794 Long term (current) use of insulin: Secondary | ICD-10-CM | POA: Diagnosis not present

## 2017-12-30 DIAGNOSIS — E1151 Type 2 diabetes mellitus with diabetic peripheral angiopathy without gangrene: Secondary | ICD-10-CM | POA: Diagnosis not present

## 2017-12-30 DIAGNOSIS — I2510 Atherosclerotic heart disease of native coronary artery without angina pectoris: Secondary | ICD-10-CM | POA: Diagnosis not present

## 2017-12-30 DIAGNOSIS — E11621 Type 2 diabetes mellitus with foot ulcer: Secondary | ICD-10-CM | POA: Insufficient documentation

## 2017-12-30 DIAGNOSIS — L97329 Non-pressure chronic ulcer of left ankle with unspecified severity: Secondary | ICD-10-CM | POA: Diagnosis not present

## 2017-12-30 DIAGNOSIS — M726 Necrotizing fasciitis: Secondary | ICD-10-CM | POA: Diagnosis not present

## 2017-12-30 DIAGNOSIS — Z7902 Long term (current) use of antithrombotics/antiplatelets: Secondary | ICD-10-CM | POA: Insufficient documentation

## 2017-12-30 DIAGNOSIS — E11622 Type 2 diabetes mellitus with other skin ulcer: Secondary | ICD-10-CM | POA: Insufficient documentation

## 2017-12-30 DIAGNOSIS — Z79899 Other long term (current) drug therapy: Secondary | ICD-10-CM | POA: Diagnosis not present

## 2017-12-30 DIAGNOSIS — L89159 Pressure ulcer of sacral region, unspecified stage: Secondary | ICD-10-CM | POA: Diagnosis not present

## 2017-12-30 DIAGNOSIS — E1140 Type 2 diabetes mellitus with diabetic neuropathy, unspecified: Secondary | ICD-10-CM | POA: Diagnosis not present

## 2017-12-31 NOTE — Progress Notes (Signed)
**Note [name]-Identified via Obfuscation** [name], [name] ([id number]) Visit Report for 12/30/2017 Arrival Information Details Patient Name: [name], [name]. Date of Service: 12/30/2017 2:15 PM Medical Record Number: [id number] Patient Account Number: [id number] Date of Birth/Sex: 18-Dec-1965 (52 y.o. Female) Treating RN: Cornell Barman Primary Care Ameliya Nicotra: Delight Stare Other Clinician: Referring Traves Majchrzak: Delight Stare Treating Khale Nigh/Extender: Melburn Hake, HOYT Weeks in Treatment: 1 Visit Information History Since Last Visit Added or deleted any medications: No Patient Arrived: Wheel Chair Any new allergies or adverse reactions: No Arrival Time: 14:36 Had a fall or experienced change in No Accompanied By: husband activities of daily living that may affect Transfer Assistance: None risk of falls: Patient Identification Verified: Yes Signs or symptoms of abuse/neglect since last visito No Secondary Verification Process Yes Hospitalized since last visit: No Completed: Pain Present Now: No Patient Requires Transmission-Based No Precautions: Patient Has Alerts: Yes Patient Alerts: Patient on Blood Thinner DM II Xarelto Electronic Signature(s) Signed: 12/30/2017 5:08:07 PM By: Gretta Cool, [name], RN, CWS, [name], [name] Entered By: Gretta Cool, [name], RN, CWS, Kim on 12/30/2017 14:36:38 Kessenich, [name] ([id number]) -------------------------------------------------------------------------------- Clinic Level of Care Assessment Details Patient Name: [name], [name]. Date of Service: 12/30/2017 2:15 PM Medical Record Number: [id number] Patient Account Number: [id number] Date of Birth/Sex: 1966-04-12 (52 y.o. Female) Treating RN: Cornell Barman Primary Care Tyreisha Ungar: Delight Stare Other Clinician: Referring Yarelie Hams: Delight Stare Treating Delanee Xin/Extender: Melburn Hake, HOYT Weeks in Treatment: 1 Clinic Level of Care Assessment Items TOOL 4 Quantity Score []  - Use when only an EandM is performed on FOLLOW-UP visit 0 ASSESSMENTS - Nursing Assessment /  Reassessment []  - Reassessment of Co-morbidities (includes updates in patient status) 0 X- 1 5 Reassessment of Adherence to Treatment Plan ASSESSMENTS - Wound and Skin Assessment / Reassessment []  - Simple Wound Assessment / Reassessment - one wound 0 X- 8 5 Complex Wound Assessment / Reassessment - multiple wounds []  - 0 Dermatologic / Skin Assessment (not related to wound area) ASSESSMENTS - Focused Assessment []  - Circumferential Edema Measurements - multi extremities 0 []  - 0 Nutritional Assessment / Counseling / Intervention []  - 0 Lower Extremity Assessment (monofilament, tuning fork, pulses) []  - 0 Peripheral Arterial Disease Assessment (using hand held doppler) ASSESSMENTS - Ostomy and/or Continence Assessment and Care []  - Incontinence Assessment and Management 0 []  - 0 Ostomy Care Assessment and Management (repouching, etc.) PROCESS - Coordination of Care X - Simple Patient / Family Education for ongoing care 1 15 []  - 0 Complex (extensive) Patient / Family Education for ongoing care X- 1 10 Staff obtains Programmer, systems, Records, Test Results / Process Orders []  - 0 Staff telephones HHA, Nursing Homes / Clarify orders / etc []  - 0 Routine Transfer to another Facility (non-emergent condition) []  - 0 Routine Hospital Admission (non-emergent condition) []  - 0 New Admissions / Biomedical engineer / Ordering NPWT, Apligraf, etc. []  - 0 Emergency Hospital Admission (emergent condition) X- 1 10 Simple Discharge Coordination Bartholomew, Hildred S. ([id number]) []  - 0 Complex (extensive) Discharge Coordination PROCESS - Special Needs []  - Pediatric / Minor Patient Management 0 []  - 0 Isolation Patient Management []  - 0 Hearing / Language / Visual special needs []  - 0 Assessment of Community assistance (transportation, D/C planning, etc.) []  - 0 Additional assistance / Altered mentation []  - 0 Support Surface(s) Assessment (bed, cushion, seat, etc.) INTERVENTIONS -  Wound Cleansing / Measurement []  - Simple Wound Cleansing - one wound 0 X- 8 5 Complex Wound Cleansing - multiple wounds X- 1 5 Wound Imaging (photographs -  any number of wounds) []  - 0 Wound Tracing (instead of photographs) []  - 0 Simple Wound Measurement - one wound X- 8 5 Complex Wound Measurement - multiple wounds INTERVENTIONS - Wound Dressings []  - Small Wound Dressing one or multiple wounds 0 X- 2 15 Medium Wound Dressing one or multiple wounds X- 1 20 Large Wound Dressing one or multiple wounds []  - 0 Application of Medications - topical []  - 0 Application of Medications - injection INTERVENTIONS - Miscellaneous []  - External ear exam 0 []  - 0 Specimen Collection (cultures, biopsies, blood, body fluids, etc.) []  - 0 Specimen(s) / Culture(s) sent or taken to Lab for analysis []  - 0 Patient Transfer (multiple staff / Civil Service fast streamer / Similar devices) []  - 0 Simple Staple / Suture removal (25 or less) []  - 0 Complex Staple / Suture removal (26 or more) []  - 0 Hypo / Hyperglycemic Management (close monitor of Blood Glucose) []  - 0 Ankle / Brachial Index (ABI) - do not check if billed separately X- 1 5 Vital Signs Sadek, [place] S. ([id number]) Has the patient been seen at the hospital within the last three years: Yes Total Score: 220 Level Of Care: New/Established - Level 5 Electronic Signature(s) Signed: 12/30/2017 5:08:07 PM By: Gretta Cool, [name], RN, CWS, [name], [name] Entered By: Gretta Cool, [name], RN, CWS, Kim on 12/30/2017 16:09:07 Krul, [name] ([id number]) -------------------------------------------------------------------------------- Encounter Discharge Information Details Patient Name: [name], [name]. Date of Service: 12/30/2017 2:15 PM Medical Record Number: [id number] Patient Account Number: [id number] Date of Birth/Sex: [birth year] (51 y.o. Female) Treating RN: Carolyne Fiscal, Debi Primary Care Jamar Casagrande: Delight Stare Other Clinician: Referring Zayvier Caravello: Delight Stare Treating Katheen Aslin/Extender: Melburn Hake, HOYT Weeks in Treatment: 1 Encounter Discharge Information Items Discharge Pain Level: 0 Discharge Condition: Stable Ambulatory Status: Wheelchair Discharge Destination: Home Transportation: Private Auto Accompanied By: husband Schedule Follow-up Appointment: Yes Medication Reconciliation completed and Yes provided to Patient/Care Seneca Hoback: Provided on Clinical Summary of Care: 12/30/2017 Form Type Recipient Paper Patient RM Electronic Signature(s) Signed: 12/30/2017 4:12:36 PM By: Gretta Cool, [name], RN, CWS, [name], [name] Entered By: Gretta Cool, [name], RN, CWS, Kim on 12/30/2017 16:12:36 Mullaly, [name] ([id number]) -------------------------------------------------------------------------------- Lower Extremity Assessment Details Patient Name: [name], [name]. Date of Service: 12/30/2017 2:15 PM Medical Record Number: [id number] Patient Account Number: [id number] Date of Birth/Sex: 1966-01-05 (52 y.o. Female) Treating RN: Cornell Barman Primary Care Rande Roylance: Delight Stare Other Clinician: Referring Shaheer Bonfield: Delight Stare Treating Keiandre Cygan/Extender: Melburn Hake, HOYT Weeks in Treatment: 1 Vascular Assessment Pulses: Dorsalis Pedis Palpable: [Left:Yes] [Right:Yes] Posterior Tibial Extremity colors, hair growth, and conditions: Extremity Color: [Left:Pale] [Right:Pale] Temperature of Extremity: [Left:Cool] [Right:Warm] Capillary Refill: [Left:< 3 seconds] [Right:> 3 seconds] Toe Nail Assessment Left: Right: Thick: Yes Yes Discolored: Yes Yes Deformed: Yes Yes Improper Length and Hygiene: Yes Yes Electronic Signature(s) Signed: 12/30/2017 5:08:07 PM By: Gretta Cool, [name], RN, CWS, [name], [name] Entered By: Gretta Cool, [name], RN, CWS, Kim on 12/30/2017 15:09:58 Dyal, [name] ([id number]) -------------------------------------------------------------------------------- Multi Wound Chart Details Patient Name: [name]. Date of Service: 12/30/2017 2:15  PM Medical Record Number: [id number] Patient Account Number: [id number] Date of Birth/Sex: [birth year] (52 y.o. Female) Treating RN: Cornell Barman Primary Care Kaisyn Millea: Delight Stare Other Clinician: Referring Alaijah Gibler: Delight Stare Treating Jonisha Kindig/Extender: Melburn Hake, HOYT Weeks in Treatment: 1 Vital Signs Height(in): 67 Pulse(bpm): 93 Weight(lbs): Blood Pressure(mmHg): 84/54 Body Mass Index(BMI): Temperature(F): 98.2 Respiratory Rate 16 (breaths/min): Photos: [3:No Photos] [4:No Photos] [5:No Photos] Wound Location: [3:Right Malleolus - Lateral] [4:Left Calcaneus - Lateral] [5:Right Toe Great -  Circumfernential] Wounding Event: [3:Pressure Injury] [4:Blister] [5:Gradually Appeared] 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] [4:Lower Extremity] [5:Lower Extremity] Comorbid History: [3:Anemia, Arrhythmia, Coronary Anemia, Arrhythmia, Coronary Anemia, Arrhythmia, Coronary Artery Disease, Hypotension, Artery Disease, Hypotension, Artery Disease, Hypotension, Peripheral Arterial Disease, Peripheral Arterial Disease,  Peripheral Arterial Disease, Type II Diabetes, Osteoarthritis] [4:Type II Diabetes, Osteoarthritis] [5:Type II Diabetes, Osteoarthritis] Date Acquired: [3:09/23/2016] [4:11/27/2017] [5:11/27/2017] Weeks of Treatment: [3:1] [4:1] [5:1] Wound Status: [3:Open] [4:Open] [5:Open] Pending Amputation on [3:No] [4:Yes] [5:Yes] Presentation: Measurements L x W x D [3:0.9x1.2x0.2] [4:6x2x0.4] [5:5x4.5x0.1] (cm) Area (cm) : [3:0.848] [4:9.425] [5:17.671] Volume (cm) : [3:0.17] [4:3.77] [5:1.767] % Reduction in Area: [3:0.00%] [4:42.10%] [5:41.20%] % Reduction in Volume: [3:0.00%] [4:-15.80%] [5:41.20%] Classification: [3:Grade 2] [4:Grade 2] [5:Grade 2] Exudate Amount: [3:Large] [4:None Present] [5:None Present] Exudate Type: [3:Serous] [4:N/A] [5:N/A] Exudate Color: [3:amber] [4:N/A] [5:N/A] Wound  Margin: [3:Distinct, outline attached] [4:Distinct, outline attached] [5:Distinct, outline attached] Granulation Amount: [3:Medium (34-66%)] [4:None Present (0%)] [5:None Present (0%)] Granulation Quality: [3:Pink] [4:N/A] [5:N/A] Necrotic Amount: [3:Medium (34-66%)] [4:Large (67-100%)] [5:Large (67-100%)] Necrotic Tissue: [3:Adherent Slough] [4:Eschar, Adherent Slough] [5:Eschar, Adherent Slough] Exposed Structures: [3:Fascia: No Fat Layer (Subcutaneous Tissue) Exposed: No Tendon: No Muscle: No] [4:Fascia: No Fat Layer (Subcutaneous Tissue) Exposed: No Tendon: No Muscle: No] [5:N/A] Joint: No Joint: No Bone: No Bone: No Epithelialization: None None None Periwound Skin Texture: No Abnormalities Noted No Abnormalities Noted No Abnormalities Noted Periwound Skin Moisture: Maceration: Yes Maceration: Yes No Abnormalities Noted Periwound Skin Color: No Abnormalities Noted No Abnormalities Noted No Abnormalities Noted Erythema Location: N/A N/A N/A Temperature: No Abnormality No Abnormality No Abnormality Tenderness on Palpation: No Yes No Wound Preparation: Ulcer Cleansing: Ulcer Cleansing: Ulcer Cleansing: Rinsed/Irrigated with Saline Rinsed/Irrigated with Saline Rinsed/Irrigated with Saline Topical Anesthetic Applied: Topical Anesthetic Applied: Topical Anesthetic Applied: None None None Wound Number: [id number]  Photos: No Photos No Photos No Photos Wound Location: Left Toe Fourth - Right Gluteus Left Foot - Lateral Circumfernential Wounding Event: Gradually Appeared Gradually Appeared Gradually Appeared 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 Comorbid History: Anemia, Arrhythmia, Coronary Anemia, Arrhythmia, Coronary Anemia, Arrhythmia, Coronary Artery Disease, Hypotension, Artery Disease, Hypotension, Artery Disease, Hypotension, Peripheral Arterial Disease, Peripheral Arterial Disease, Peripheral Arterial  Disease, Type II Diabetes, Type II Diabetes, Type II Diabetes, Osteoarthritis Osteoarthritis Osteoarthritis Date Acquired: 11/27/2017 09/18/2016 11/27/2017 Weeks of Treatment: 1 1 1  Wound Status: Open Open Open Pending Amputation on Yes No Yes Presentation: Measurements L x W x D 1.5x6x0.1 1.2x1x0.6 8x4x0.1 (cm) Area (cm) : 7.069 0.942 25.133 Volume (cm) : 0.707 0.565 2.513 % Reduction in Area: -50.00% 36.90% 75.00% % Reduction in Volume: -50.10% 45.90% 75.00% 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 Exudate Amount: Large Large Medium Exudate Type: Serosanguineous Serosanguineous Serosanguineous Exudate Color: red, brown red, brown red, brown Wound Margin: Distinct, outline attached Distinct, outline attached Distinct, outline attached Granulation Amount: None Present (0%) Large (67-100%) None Present (0%) Granulation Quality: N/A Red N/A Necrotic Amount: Large (67-100%) Small (1-33%) Large (67-100%) Necrotic Tissue: Eschar, Adherent Slough Adherent Slough Eschar Exposed Structures: N/A N/A N/A Epithelialization: None None None Periwound Skin Texture: No Abnormalities Noted Scarring: Yes No Abnormalities Noted Periwound Skin Moisture: No Abnormalities Noted No Abnormalities Noted Maceration: Yes Periwound Skin Color: No Abnormalities Noted Erythema: Yes No Abnormalities Noted Erythema Location: N/A Circumferential N/A Temperature: No Abnormality No Abnormality No Abnormality Tenderness on Palpation: No Yes No Wound Preparation: Ulcer Cleansing: Ulcer  Cleansing: Ulcer Cleansing: Rinsed/Irrigated with Saline Rinsed/Irrigated with Saline Rinsed/Irrigated with Saline Denardo, Jinan S. ([id number]) Topical Anesthetic Applied: Topical Anesthetic Applied: Topical Anesthetic Applied: None None None Wound Number: 9 N/A N/A Photos: No Photos N/A N/A Wound Location: Left Toe Third N/A N/A Wounding Event: Gradually Appeared N/A N/A Primary Etiology: Diabetic Wound/Ulcer of  the N/A N/A Lower Extremity Comorbid History: Anemia, Arrhythmia, Coronary N/A N/A Artery Disease, Hypotension, Peripheral Arterial Disease, Type II Diabetes, Osteoarthritis Date Acquired: 11/27/2017 N/A N/A Weeks of Treatment: 1 N/A N/A Wound Status: Open N/A N/A Pending Amputation on Yes N/A N/A Presentation: Measurements L x W x D 0.1x0.1x0.1 N/A N/A (cm) Area (cm) : 0.008 N/A N/A Volume (cm) : 0.001 N/A N/A % Reduction in Area: 97.70% N/A N/A % Reduction in Volume: 97.10% N/A N/A Classification: Grade 2 N/A N/A Exudate Amount: None Present N/A N/A Exudate Type: N/A N/A N/A Exudate Color: N/A N/A N/A Wound Margin: Distinct, outline attached N/A N/A Granulation Amount: None Present (0%) N/A N/A Granulation Quality: N/A N/A N/A Necrotic Amount: Large (67-100%) N/A N/A Necrotic Tissue: Eschar N/A N/A Exposed Structures: N/A N/A N/A Epithelialization: None N/A N/A Periwound Skin Texture: No Abnormalities Noted N/A N/A Periwound Skin Moisture: No Abnormalities Noted N/A N/A Periwound Skin Color: No Abnormalities Noted N/A N/A Erythema Location: N/A N/A N/A Temperature: No Abnormality N/A N/A Tenderness on Palpation: No N/A N/A Wound Preparation: Ulcer Cleansing: N/A N/A Rinsed/Irrigated with Saline Topical Anesthetic Applied: None Treatment Notes Electronic Signature(s) Signed: 12/30/2017 5:08:07 PM By: Gretta Cool, [name], RN, CWS, [name], [name] Entered By: Gretta Cool, [name], RN, CWS, Kim on 12/30/2017 15:17:55 Detert, [name] ([id number]) Rodeheaver, [place]. ([id number]) -------------------------------------------------------------------------------- [name] Details Patient Name: [name], [name]. Date of Service: 12/30/2017 2:15 PM Medical Record Number: [id number] Patient Account Number: [id number] Date of Birth/Sex: 1966/10/12 (52 y.o. Female) Treating RN: Cornell Barman Primary Care Kathyann Spaugh: Delight Stare Other Clinician: Referring Ger Nicks: Delight Stare Treating  Quinnlan Abruzzo/Extender: Melburn Hake, HOYT Weeks in Treatment: 1 Active Inactive ` Abuse / Safety / Falls / Self Care Management Nursing Diagnoses: Potential for falls Goals: Patient will not experience any injury related to falls Date Initiated: 12/18/2017 Target Resolution Date: 02/21/2018 Goal Status: Active Interventions: Assess Activities of Daily Living upon admission and as needed Assess fall risk on admission and as needed Assess: immobility, friction, shearing, incontinence upon admission and as needed Assess impairment of mobility on admission and as needed per policy Assess personal safety and home safety (as indicated) on admission and as needed Assess self care needs on admission and as needed Provide education on basic hygiene Provide education on fall prevention Treatment Activities: Education provided on Basic Hygiene : 12/18/2017 Notes: ` Necrotic Tissue Nursing Diagnoses: Impaired tissue integrity related to necrotic/devitalized tissue Knowledge deficit related to management of necrotic/devitalized tissue Goals: Patient/caregiver will verbalize understanding of reason and process for debridement of necrotic tissue Date Initiated: 12/18/2017 Target Resolution Date: 03/28/2018 Goal Status: Active Interventions: Assess patient pain level pre-, during and post procedure and prior to discharge Provide education on necrotic tissue and debridement process Notes: BRIZEYDA, HOLTMEYER ([id number]) ` Nutrition Nursing Diagnoses: Imbalanced nutrition Impaired glucose control: actual or potential Potential for alteratiion in Nutrition/Potential for imbalanced nutrition Goals: Patient/caregiver verbalizes understanding of need to maintain therapeutic glucose control per primary care physician Date Initiated: 12/18/2017 Target Resolution Date: 02/21/2018 Goal Status: Active Interventions: Assess patient nutrition upon admission and as needed per policy Notes: ` Orientation to the  Wound Care Program Nursing Diagnoses: Knowledge deficit  related to the wound healing center program Goals: Patient/caregiver will verbalize understanding of the [place] Date Initiated: 12/18/2017 Target Resolution Date: 12/27/2017 Goal Status: Active Interventions: Provide education on orientation to the wound center Notes: ` Pain, Acute or Chronic Nursing Diagnoses: Pain, acute or chronic: actual or potential Potential alteration in comfort, pain Goals: Patient/caregiver will verbalize adequate pain control between visits Date Initiated: 12/18/2017 Target Resolution Date: 03/28/2018 Goal Status: Active Interventions: Complete pain assessment as per visit requirements Notes: ` Wound/Skin Impairment [name], [name] ([id number]) Nursing Diagnoses: Impaired tissue integrity Knowledge deficit related to ulceration/compromised skin integrity Goals: Ulcer/skin breakdown will have a volume reduction of 80% by week 12 Date Initiated: 12/18/2017 Target Resolution Date: 03/21/2018 Goal Status: Active Interventions: Assess patient/caregiver ability to perform ulcer/skin care regimen upon admission and as needed Assess ulceration(s) every visit Notes: Electronic Signature(s) Signed: 12/30/2017 5:08:07 PM By: Gretta Cool, [name], RN, CWS, [name], [name] Entered By: Gretta Cool, [name], RN, CWS, Kim on 12/30/2017 15:16:58 Odor, [name] ([id number]) -------------------------------------------------------------------------------- Pain Assessment Details Patient Name: MILIANA, GANGWER. Date of Service: 12/30/2017 2:15 PM Medical Record Number: [id number] Patient Account Number: [id number] Date of Birth/Sex: 1966/11/03 (52 y.o. Female) Treating RN: Cornell Barman Primary Care Siara Gorder: Delight Stare Other Clinician: Referring Cloyce Paterson: Delight Stare Treating Dashaun Onstott/Extender: Melburn Hake, HOYT Weeks in Treatment: 1 Active Problems Location of Pain Severity and Description of Pain Patient Has  Paino Yes Site Locations Pain Location: Generalized Pain Pain Management and Medication Current Pain Management: Goals for Pain Management Topical or injectable lidocaine is offered to patient for acute pain when surgical debridement is performed. If needed, Patient is instructed to use over the counter pain medication for the following 24-48 hours after debridement. Wound care MDs do not prescribed pain medications. Patient has chronic pain or uncontrolled pain. Patient has been instructed to make an appointment with their Primary Care Physician for pain management. Electronic Signature(s) Signed: 12/30/2017 5:08:07 PM By: Gretta Cool, [name], RN, CWS, [name], [name] Entered By: Gretta Cool, [name], RN, CWS, Kim on 12/30/2017 14:37:08 Weatherholtz, [name] ([id number]) -------------------------------------------------------------------------------- Patient/Caregiver Education Details Patient Name: [name], [name]. Date of Service: 12/30/2017 2:15 PM Medical Record Number: [id number] Patient Account Number: [id number] Date of Birth/Gender: [birth year] (52 y.o. Female) Treating RN: Cornell Barman Primary Care Physician: Delight Stare Other Clinician: Referring Physician: Delight Stare Treating Physician/Extender: Sharalyn Ink in Treatment: 1 Education Assessment Education Provided To: Patient Education Topics Provided Tissue Oxygenation: Handouts: Peripheral Arterial Disease and Related Ulcers Methods: Demonstration, Explain/Verbal Responses: State content correctly Electronic Signature(s) Signed: 12/30/2017 5:08:07 PM By: Gretta Cool, [name], RN, CWS, [name], [name] Entered By: Gretta Cool, [name], RN, CWS, Kim on 12/30/2017 16:13:12 Lucatero, [name] ([id number]) -------------------------------------------------------------------------------- Wound Assessment Details Patient Name: [name], [name]. Date of Service: 12/30/2017 2:15 PM Medical Record Number: [id number] Patient Account Number: [id number] Date of Birth/Sex:  [birth year] (52 y.o. Female) Treating RN: Cornell Barman Primary Care Esabella Stockinger: Delight Stare Other Clinician: Referring Helmuth Recupero: Delight Stare Treating Jaiah Weigel/Extender: STONE III, HOYT Weeks in Treatment: 1 Wound Status Wound Number: 10 Primary Diabetic Wound/Ulcer of the Lower Extremity Etiology: Wound Location: Left Toe Fifth Wound Open Wounding Event: Gradually Appeared Status: Date Acquired: 11/27/2017 Comorbid Anemia, Arrhythmia, Coronary Artery Disease, Weeks Of Treatment: 0 History: Hypotension, Peripheral Arterial Disease, Type Clustered Wound: No II Diabetes, Osteoarthritis Photos Photo Uploaded By: Gretta Cool, [name], RN, CWS, Kim on 12/30/2017 16:36:44 Wound Measurements Length: (cm) 3 Width: (cm) 4 Depth: (cm) 0.1 Area: (cm) 9.425 Volume: (cm) 0.942 % Reduction in Area: 0% %  Reduction in Volume: 0% Epithelialization: None Tunneling: No Undermining: No Wound Description Classification: Grade 1 Wound Margin: Distinct, outline attached Exudate Amount: Medium Exudate Type: Serosanguineous Exudate Color: red, brown Foul Odor After Cleansing: No Slough/Fibrino Yes Wound Bed Granulation Amount: None Present (0%) Exposed Structure Necrotic Amount: Large (67-100%) Fascia Exposed: No Necrotic Quality: Eschar, Adherent Slough Fat Layer (Subcutaneous Tissue) Exposed: No Tendon Exposed: No Muscle Exposed: No Joint Exposed: No Bone Exposed: No Periwound Skin Texture Texture Color No Abnormalities Noted: No No Abnormalities Noted: No Worst, Riko S. ([id number]) Moisture Temperature / Pain No Abnormalities Noted: No Temperature: No Abnormality Tenderness on Palpation: Yes Wound Preparation Ulcer Cleansing: Rinsed/Irrigated with Saline Topical Anesthetic Applied: None Electronic Signature(s) Signed: 12/30/2017 5:08:07 PM By: Gretta Cool, [name], RN, CWS, [name], [name] Entered By: Gretta Cool, [name], RN, CWS, Kim on 12/30/2017 15:24:15 Minkler, [name]  ([id number]) -------------------------------------------------------------------------------- Wound Assessment Details Patient Name: [name]. Date of Service: 12/30/2017 2:15 PM Medical Record Number: [id number] Patient Account Number: [id number] Date of Birth/Sex: 07/21/1966 (52 y.o. Female) Treating RN: Cornell Barman Primary Care Laurin Morgenstern: Delight Stare Other Clinician: Referring Teague Goynes: Delight Stare Treating Kaidin Boehle/Extender: STONE III, HOYT Weeks in Treatment: 1 Wound Status Wound Number: 3 Primary Diabetic Wound/Ulcer of the Lower Extremity Etiology: Wound Location: Right Malleolus - Lateral Wound Open Wounding Event: Pressure Injury Status: Date Acquired: 09/23/2016 Comorbid Anemia, Arrhythmia, Coronary Artery Disease, Weeks Of Treatment: 1 History: Hypotension, Peripheral Arterial Disease, Type Clustered Wound: No II Diabetes, Osteoarthritis Photos Photo Uploaded By: Gretta Cool, [name], RN, CWS, Kim on 12/30/2017 16:30:15 Wound Measurements Length: (cm) 0.9 Width: (cm) 1.2 Depth: (cm) 0.2 Area: (cm) 0.848 Volume: (cm) 0.17 % Reduction in Area: 0% % Reduction in Volume: 0% Epithelialization: None Tunneling: No Undermining: No Wound Description Classification: Grade 2 Wound Margin: Distinct, outline attached Exudate Amount: Large Exudate Type: Serous Exudate Color: amber Foul Odor After Cleansing: No Slough/Fibrino Yes Wound Bed Granulation Amount: Medium (34-66%) Exposed Structure Granulation Quality: Pink Fascia Exposed: No Necrotic Amount: Medium (34-66%) Fat Layer (Subcutaneous Tissue) Exposed: No Necrotic Quality: Adherent Slough Tendon Exposed: No Muscle Exposed: No Joint Exposed: No Bone Exposed: No Periwound Skin Texture Texture Color No Abnormalities Noted: No No Abnormalities Noted: No Kilty, Sephora S. ([id number]) Moisture Temperature / Pain No Abnormalities Noted: No Temperature: No Abnormality Maceration: Yes Wound Preparation Ulcer  Cleansing: Rinsed/Irrigated with Saline Topical Anesthetic Applied: None Treatment Notes Wound #3 (Right, Lateral Malleolus) 1. Cleansed with: Clean wound with Normal Saline 2. Anesthetic Topical Lidocaine 4% cream to wound bed prior to debridement 4. Dressing Applied: Prisma Ag 5. Secondary Dressing Applied Bordered Foam Dressing Electronic Signature(s) Signed: 12/30/2017 5:08:07 PM By: Gretta Cool, [name], RN, CWS, [name], [name] Entered By: Gretta Cool, [name], RN, CWS, Kim on 12/30/2017 15:13:12 [place], [name] ([id number]) -------------------------------------------------------------------------------- Wound Assessment Details Patient Name: [name], [name]. Date of Service: 12/30/2017 2:15 PM Medical Record Number: [id number] Patient Account Number: [id number] Date of Birth/Sex: [birth year] (52 y.o. Female) Treating RN: Cornell Barman Primary Care Anaysia Germer: Delight Stare Other Clinician: Referring Jatia Musa: Delight Stare Treating Shermaine Rivet/Extender: STONE III, HOYT Weeks in Treatment: 1 Wound Status Wound Number: 4 Primary Diabetic Wound/Ulcer of the Lower Extremity Etiology: Wound Location: Left Calcaneus - Lateral Wound Open Wounding Event: Blister Status: Date Acquired: 11/27/2017 Comorbid Anemia, Arrhythmia, Coronary Artery Disease, Weeks Of Treatment: 1 History: Hypotension, Peripheral Arterial Disease, Type Clustered Wound: No II Diabetes, Osteoarthritis Pending Amputation On Presentation Photos Photo Uploaded By: Gretta Cool, [name], RN, CWS, Kim on 12/30/2017 16:31:42 Wound Measurements Length: (cm) 6 Width: (cm)  2 Depth: (cm) 0.4 Area: (cm) 9.425 Volume: (cm) 3.77 % Reduction in Area: 42.1% % Reduction in Volume: -15.8% Epithelialization: None Tunneling: No Undermining: No Wound Description Classification: Grade 2 Wound Margin: Distinct, outline attached Exudate Amount: None Present Foul Odor After Cleansing: No Slough/Fibrino Yes Wound Bed Granulation Amount: None Present (0%)  Exposed Structure Necrotic Amount: Large (67-100%) Fascia Exposed: No Necrotic Quality: Eschar, Adherent Slough Fat Layer (Subcutaneous Tissue) Exposed: No Tendon Exposed: No Muscle Exposed: No Joint Exposed: No Bone Exposed: No Periwound Skin Texture Texture Color No Abnormalities Noted: No No Abnormalities Noted: No Moisture Temperature / Pain No Abnormalities Noted: No Temperature: No Abnormality Schweikert, Sabrie S. ([id number]) Maceration: Yes Tenderness on Palpation: Yes Wound Preparation Ulcer Cleansing: Rinsed/Irrigated with Saline Topical Anesthetic Applied: None Treatment Notes Wound #4 (Left, Lateral Calcaneus) 1. Cleansed with: Clean wound with Normal Saline 2. Anesthetic Topical Lidocaine 4% cream to wound bed prior to debridement 4. Dressing Applied: Other dressing (specify in notes) 5. Secondary Dressing Applied ABD Pad Kerlix/Conform 7. Secured with Tape Notes betadine Electronic Signature(s) Signed: 12/30/2017 5:08:07 PM By: Gretta Cool, [name], RN, CWS, [name], [name] Entered By: Gretta Cool, [name], RN, CWS, Kim on 12/30/2017 15:13:24 [place], [name] ([id number]) -------------------------------------------------------------------------------- Wound Assessment Details Patient Name: [name], DELEO. Date of Service: 12/30/2017 2:15 PM Medical Record Number: [id number] Patient Account Number: [id number] Date of Birth/Sex: 1966-02-16 (52 y.o. Female) Treating RN: Cornell Barman Primary Care Field Staniszewski: Delight Stare Other Clinician: Referring Paloma Grange: Delight Stare Treating Wayburn Shaler/Extender: Melburn Hake, HOYT Weeks in Treatment: 1 Wound Status Wound Number: 5 Primary Diabetic Wound/Ulcer of the Lower Extremity Etiology: Wound Location: Right Toe Great - Circumfernential Wound Open Wounding Event: Gradually Appeared Status: Date Acquired: 11/27/2017 Comorbid Anemia, Arrhythmia, Coronary Artery Disease, Weeks Of Treatment: 1 History: Hypotension, Peripheral Arterial Disease,  Type Clustered Wound: No II Diabetes, Osteoarthritis Pending Amputation On Presentation Photos Photo Uploaded By: Gretta Cool, [name], RN, CWS, Kim on 12/30/2017 16:31:42 Wound Measurements Length: (cm) 5 Width: (cm) 4.5 Depth: (cm) 0.1 Area: (cm) 17.671 Volume: (cm) 1.767 % Reduction in Area: 41.2% % Reduction in Volume: 41.2% Epithelialization: None Tunneling: No Undermining: No Wound Description Classification: Grade 2 Wound Margin: Distinct, outline attached Exudate Amount: None Present Foul Odor After Cleansing: No Slough/Fibrino Yes Wound Bed Granulation Amount: None Present (0%) Necrotic Amount: Large (67-100%) Necrotic Quality: Eschar, Adherent Slough Periwound Skin Texture Texture Color No Abnormalities Noted: No No Abnormalities Noted: No Moisture Temperature / Pain No Abnormalities Noted: No Temperature: No Abnormality Wound Preparation Ulcer Cleansing: Rinsed/Irrigated with Saline Topical Anesthetic Applied: None Mario, Phoua S. ([id number]) Treatment Notes Wound #5 (Right, Circumferential Toe Great) 1. Cleansed with: Clean wound with Normal Saline 2. Anesthetic Topical Lidocaine 4% cream to wound bed prior to debridement 4. Dressing Applied: Other dressing (specify in notes) 5. Secondary Dressing Applied ABD Pad Kerlix/Conform 7. Secured with Tape Notes betadine Electronic Signature(s) Signed: 12/30/2017 5:08:07 PM By: Gretta Cool, [name], RN, CWS, [name], [name] Entered By: Gretta Cool, [name], RN, CWS, Kim on 12/30/2017 15:13:38 Alt[place], [name] ([id number]) -------------------------------------------------------------------------------- Wound Assessment Details Patient Name: ORVILLE, WIDMANN. Date of Service: 12/30/2017 2:15 PM Medical Record Number: [id number] Patient Account Number: [id number] Date of Birth/Sex: 1966-03-07 (52 y.o. Female) Treating RN: Cornell Barman Primary Care Amberlie Gaillard: Delight Stare Other Clinician: Referring Vianney Kopecky: Delight Stare Treating  Shatisha Falter/Extender: STONE III, HOYT Weeks in Treatment: 1 Wound Status Wound Number: 6 Primary Diabetic Wound/Ulcer of the Lower Extremity Etiology: Wound Location: Left Toe Fourth - Circumfernential Wound Open Wounding Event: Gradually Appeared Status: Date  Acquired: 11/27/2017 Comorbid Anemia, Arrhythmia, Coronary Artery Disease, Weeks Of Treatment: 1 History: Hypotension, Peripheral Arterial Disease, Type Clustered Wound: No II Diabetes, Osteoarthritis Pending Amputation On Presentation Photos Photo Uploaded By: Gretta Cool, [name], RN, CWS, Kim on 12/30/2017 16:33:14 Wound Measurements Length: (cm) 1.5 Width: (cm) 6 Depth: (cm) 0.1 Area: (cm) 7.069 Volume: (cm) 0.707 % Reduction in Area: -50% % Reduction in Volume: -50.1% Epithelialization: None Tunneling: No Undermining: No Wound Description Classification: Grade 2 Wound Margin: Distinct, outline attached Exudate Amount: Large Exudate Type: Serosanguineous Exudate Color: red, brown Foul Odor After Cleansing: No Slough/Fibrino Yes Wound Bed Granulation Amount: None Present (0%) Necrotic Amount: Large (67-100%) Necrotic Quality: Eschar, Adherent Slough Periwound Skin Texture Texture Color No Abnormalities Noted: No No Abnormalities Noted: No Moisture Temperature / Pain No Abnormalities Noted: No Temperature: No Abnormality Wound Preparation Soth, Susann S. ([id number]) Ulcer Cleansing: Rinsed/Irrigated with Saline Topical Anesthetic Applied: None Treatment Notes Wound #6 (Left, Circumferential Toe Fourth) 1. Cleansed with: Clean wound with Normal Saline 2. Anesthetic Topical Lidocaine 4% cream to wound bed prior to debridement 4. Dressing Applied: Other dressing (specify in notes) 5. Secondary Dressing Applied ABD Pad Kerlix/Conform 7. Secured with Tape Notes betadine, silvercell Electronic Signature(s) Signed: 12/30/2017 5:08:07 PM By: Gretta Cool, [name], RN, CWS, [name], [name] Entered By: Gretta Cool, [name], RN, CWS,  Kim on 12/30/2017 15:24:36 Bankert, [name] ([id number]) -------------------------------------------------------------------------------- Wound Assessment Details Patient Name: LILINOE, ACKLIN. Date of Service: 12/30/2017 2:15 PM Medical Record Number: [id number] Patient Account Number: [id number] Date of Birth/Sex: September 29, 1966 (52 y.o. Female) Treating RN: Cornell Barman Primary Care Koreen Lizaola: Delight Stare Other Clinician: Referring Kayleanna Lorman: Delight Stare Treating Meekah Math/Extender: Melburn Hake, HOYT Weeks in Treatment: 1 Wound Status Wound Number: 7 Primary Pressure Ulcer Etiology: Wound Location: Right Gluteus Wound Open Wounding Event: Gradually Appeared Status: Date Acquired: 09/18/2016 Comorbid Anemia, Arrhythmia, Coronary Artery Disease, Weeks Of Treatment: 1 History: Hypotension, Peripheral Arterial Disease, Type Clustered Wound: No II Diabetes, Osteoarthritis Photos Photo Uploaded By: Gretta Cool, [name], RN, CWS, Kim on 12/30/2017 16:32:19 Wound Measurements Length: (cm) 1.2 Width: (cm) 1 Depth: (cm) 0.6 Area: (cm) 0.942 Volume: (cm) 0.565 % Reduction in Area: 36.9% % Reduction in Volume: 45.9% Epithelialization: None Tunneling: No Undermining: No Wound Description Classification: Category/Stage IV Wound Margin: Distinct, outline attached Exudate Amount: Large Exudate Type: Serosanguineous Exudate Color: red, brown Foul Odor After Cleansing: No Slough/Fibrino Yes Wound Bed Granulation Amount: Large (67-100%) Granulation Quality: Red Necrotic Amount: Small (1-33%) Necrotic Quality: Adherent Slough Periwound Skin Texture Texture Color No Abnormalities Noted: No No Abnormalities Noted: No Scarring: Yes Erythema: Yes Erythema Location: Circumferential Moisture No Abnormalities Noted: No Temperature / Pain Dufault, Maigen S. ([id number]) Temperature: No Abnormality Tenderness on Palpation: Yes Wound Preparation Ulcer Cleansing: Rinsed/Irrigated with Saline Topical  Anesthetic Applied: None Treatment Notes Wound #7 (Right Gluteus) 1. Cleansed with: Clean wound with Normal Saline 2. Anesthetic Topical Lidocaine 4% cream to wound bed prior to debridement 4. Dressing Applied: Prisma Ag 5. Secondary Dressing Applied Bordered Foam Dressing Electronic Signature(s) Signed: 12/30/2017 5:08:07 PM By: Gretta Cool, [name], RN, CWS, [name], [name] Entered By: Gretta Cool, [name], RN, CWS, Kim on 12/30/2017 15:16:20 Guess, [name] ([id number]) -------------------------------------------------------------------------------- Wound Assessment Details Patient Name: JOY, REIGER. Date of Service: 12/30/2017 2:15 PM Medical Record Number: [id number] Patient Account Number: [id number] Date of Birth/Sex: [birth year] (52 y.o. Female) Treating RN: Cornell Barman Primary Care Korby Ratay: Delight Stare Other Clinician: Referring Britteney Ayotte: Delight Stare Treating Lawarence Meek/Extender: STONE III, HOYT Weeks in Treatment: 1 Wound Status Wound Number: 8 Primary Diabetic  Wound/Ulcer of the Lower Extremity Etiology: Wound Location: Left, Lateral Foot Wound Open Wounding Event: Gradually Appeared Status: Date Acquired: 11/27/2017 Comorbid Anemia, Arrhythmia, Coronary Artery Disease, Weeks Of Treatment: 1 History: Hypotension, Peripheral Arterial Disease, Type Clustered Wound: No II Diabetes, Osteoarthritis Pending Amputation On Presentation Photos Photo Uploaded By: Gretta Cool, [name], RN, CWS, Kim on 12/30/2017 16:35:42 Wound Measurements Length: (cm) 7.5 Width: (cm) 4 Depth: (cm) 0.1 Area: (cm) 23.562 Volume: (cm) 2.356 % Reduction in Area: 76.6% % Reduction in Volume: 76.6% Epithelialization: None Tunneling: No Undermining: No Wound Description Classification: Grade 2 Wound Margin: Distinct, outline attached Exudate Amount: Medium Exudate Type: Serosanguineous Exudate Color: red, brown Foul Odor After Cleansing: No Slough/Fibrino Yes Wound Bed Granulation Amount: None Present  (0%) Necrotic Amount: Large (67-100%) Necrotic Quality: Eschar Periwound Skin Texture Texture Color No Abnormalities Noted: No No Abnormalities Noted: No Moisture Temperature / Pain No Abnormalities Noted: No Temperature: No Abnormality Maceration: Yes Kever, Haelie S. ([id number]) Wound Preparation Ulcer Cleansing: Rinsed/Irrigated with Saline Topical Anesthetic Applied: None Treatment Notes Wound #8 (Left, Lateral Foot) 1. Cleansed with: Clean wound with Normal Saline 3. Peri-wound Care: Other peri-wound care (specify in notes) 4. Dressing Applied: Other dressing (specify in notes) 5. Secondary Dressing Applied ABD and Kerlix/Conform Notes betadine and silvercel Electronic Signature(s) Signed: 12/30/2017 5:08:07 PM By: Gretta Cool, [name], RN, CWS, [name], [name] Entered By: Gretta Cool, [name], RN, CWS, Kim on 12/30/2017 15:23:36 Gange, [name] ([id number]) -------------------------------------------------------------------------------- Wound Assessment Details Patient Name: DONINIQUE, LWIN. Date of Service: 12/30/2017 2:15 PM Medical Record Number: [id number] Patient Account Number: [id number] Date of Birth/Sex: 14-Jun-1966 (52 y.o. Female) Treating RN: Cornell Barman Primary Care Reana Chacko: Delight Stare Other Clinician: Referring Amar Sippel: Delight Stare Treating Jarmon Javid/Extender: STONE III, HOYT Weeks in Treatment: 1 Wound Status Wound Number: 9 Primary Diabetic Wound/Ulcer of the Lower Extremity Etiology: Wound Location: Left Toe Third Wound Open Wounding Event: Gradually Appeared Status: Date Acquired: 11/27/2017 Comorbid Anemia, Arrhythmia, Coronary Artery Disease, Weeks Of Treatment: 1 History: Hypotension, Peripheral Arterial Disease, Type Clustered Wound: No II Diabetes, Osteoarthritis Pending Amputation On Presentation Photos Photo Uploaded By: Gretta Cool, [name], RN, CWS, Kim on 12/30/2017 16:35:43 Wound Measurements Length: (cm) 0.1 Width: (cm) 0.1 Depth: (cm) 0.1 Area: (cm)  0.008 Volume: (cm) 0.001 % Reduction in Area: 97.7% % Reduction in Volume: 97.1% Epithelialization: None Tunneling: No Undermining: No Wound Description Classification: Grade 2 Wound Margin: Distinct, outline attached Exudate Amount: None Present Foul Odor After Cleansing: No Slough/Fibrino No Wound Bed Granulation Amount: None Present (0%) Necrotic Amount: Large (67-100%) Necrotic Quality: Eschar Periwound Skin Texture Texture Color No Abnormalities Noted: No No Abnormalities Noted: No Moisture Temperature / Pain No Abnormalities Noted: No Temperature: No Abnormality Wound Preparation Ulcer Cleansing: Rinsed/Irrigated with Saline Topical Anesthetic Applied: None Ludtke, Jacee S. ([id number]) Treatment Notes Wound #9 (Left Toe Third) 1. Cleansed with: Clean wound with Normal Saline 2. Anesthetic Topical Lidocaine 4% cream to wound bed prior to debridement 4. Dressing Applied: Other dressing (specify in notes) 5. Secondary Dressing Applied ABD Pad Kerlix/Conform 7. Secured with Tape Notes betadine Electronic Signature(s) Signed: 12/30/2017 5:08:07 PM By: Gretta Cool, [name], RN, CWS, [name], [name] Entered By: Gretta Cool, [name], RN, CWS, Kim on 12/30/2017 15:16:48 [place], [name] ([id number]) -------------------------------------------------------------------------------- [place] Details Patient Name: TANNAH, DREYFUSS. Date of Service: 12/30/2017 2:15 PM Medical Record Number: [id number] Patient Account Number: [id number] Date of Birth/Sex: 10/16/1966 (52 y.o. Female) Treating RN: Cornell Barman Primary Care Adonis Yim: Delight Stare Other Clinician: Referring Matsuko Kretz: Delight Stare Treating Mickie Kozikowski/Extender: Melburn Hake, HOYT Weeks  in Treatment: 1 Vital Signs Time Taken: 14:37 Temperature (F): 98.2 Height (in): 67 Pulse (bpm): 93 Respiratory Rate (breaths/min): 16 Blood Pressure (mmHg): 84/54 Reference Range: 80 - 120 mg / dl Electronic Signature(s) Signed: 12/30/2017 5:08:07 PM  By: Gretta Cool, [name], RN, CWS, [name], [name] Entered By: Gretta Cool, [name], RN, CWS, Kim on 12/30/2017 14:37:29

## 2018-01-04 NOTE — Progress Notes (Signed)
[name], [name] ([id number]) Visit Report for 12/30/2017 Chief Complaint Document Details Patient Name: [name], [name]. Date of Service: 12/30/2017 2:15 PM Medical Record Number: [id number] Patient Account Number: [id number] Date of Birth/Sex: 06/13/1966 (52 y.o. Female) Treating RN: Carolyne Fiscal, Debi Primary Care Provider: Delight Stare Other Clinician: Referring Provider: Delight Stare Treating Provider/Extender: Melburn Hake, HOYT Weeks in Treatment: 1 Information Obtained from: Patient Chief Complaint She is here in follow up evaluation for multiple wounds Electronic Signature(s) Signed: 12/30/2017 5:22:42 PM By: Worthy Keeler PA-C Entered By: Worthy Keeler on 12/30/2017 14:50:58 Ziebell, [name] ([id number]) -------------------------------------------------------------------------------- HPI Details Patient Name: [name]. Date of Service: 12/30/2017 2:15 PM Medical Record Number: [id number] Patient Account Number: [id number] Date of Birth/Sex: 26-May-1966 (52 y.o. Female) Treating RN: Carolyne Fiscal, Debi Primary Care Provider: Delight Stare Other Clinician: Referring Provider: Delight Stare Treating Provider/Extender: Melburn Hake, HOYT Weeks in Treatment: 1 History of Present Illness HPI Description: 52 year old patient was sent to [place] from [facility] where she was seen by Dr. Sherril Cong for a left ankle ulceration and was recently hospitalized with hypotension and sepsis. She was treated with IV antibiotics and has been scheduled to see Dr. Sharol Given. She was seen by vascular surgery who recommended a femoral bypass but surgery has been delayed until her sacral wound from last year's necrotizing fasciitis has healed. Was seeing the wound care team at [facility] but wanted to change over. She is a smoker and smokes a pack of cigarettes a day The patient was recently admitted in [place] between February 2 and February 14. She had a follow-up to see vascular surgery, orthopedic  surgery and infectious disease. 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 Review of the electronic medical records reveals that Dr. 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 An MRI showed edema in the bone which was consistent with osteomyelitis and the chronic ulcer and may need surgical intervention. She also had a sacral decubitus ulcer which was treated with the wound VAC. On 01/07/2017 she was taken up by the vascular surgeon Dr. Trula Slade for an abdominal aortogram and bilateral lower extremity runoff, for a history of having bilateral femoropopliteal bypass graft as well as external iliac stenting on the right and stenting of her bypass graft. She had developed a nonhealing wound on her right ankle and there was a possibility of a femoral occlusion. the findings were noted and the impression was a surgical revascularization with a aorto bifemoral bypass graft. Both the femoropopliteal bypass grafts were patent. 01/31/2017 -- x-ray of the right hip and pelvis -- IMPRESSION:No radiographic evidence of acute osteomyelitis. Normal-appearing right hip joint space for age. No acute bony abnormality of the hip. 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 Since seeing her last week she has not had an appointment with infectious disease or the orthopedic specialist yet. 02/06/2017 -- the patient missed a couple of appointments yesterday due to the weather but other than that has apparently given up smoking for the  last 5 days. 02/13/2017 -- she has rescheduled her infectious disease  appointment and also the appointment with the orthopedic surgeon Dr. Sharol Given 03/06/2017 -- she was seen by Dr. Lucianne Lei dam of infectious disease on 03/05/2017 and he recommendedIV antibiotics but the patient did not want to have that and he has given her Augmentin and doxycycline and will reevaluate her in 2 months time. 03/13/2017 -- he saw Dr. Trula Slade regarding her vascular issues and he would like to wait to the sacral wound is completely closed before considering aortobifemoral bypass graft. He will see her back in 2 months time. She was also seen by Dr. Sharol Given of orthopedics who recommended continue wound care dressings and follow in the office in about 2 months time. he also recommended 3 view radiographs of the right ankle at follow-up. 03/28/2017 -- she did have a PICC line placed and Dr. Lucianne Lei dam has begun IV vancomycin and ceftriaxone. she is going to continue this until she sees him in approximately a month's time 04/18/2017 -- we had applied for a skin substitute for the patient's care but her copayment is going to be about $300 a piece and the patient will not be able to afford this. 05/08/17 on evaluation today patient appears to potentially have a fungal rash in the periwound region of the sacral area. This also seems to extend into the inguinal creases and factional region as well. She is tender to palpation with light rubbing over the region of the periwound and the skin in this region does have a beefy red appearance. Everything seems to point to a fungal infection at this time. She has been tolerating the dressing changes up to this point. There is no evidence of infection otherwise. 05/16/2017 -- was seen by Dr. Rhina Brackett dam of infectious disease -- he saw for the chronic wound over her right ankle [name], [name]. ([id number]) with osteomyelitis of the fibula. He did not think that she is making progress and was  not able to examine her sacral decubitus ulcer as the patient would not allow it. She was switched to Bactrim DS 2 tablets twice a day since finishing her antibiotics. Her ESR was 76 and a CRP was 2.8. 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 05/23/17 on evaluation today patient's wound appears to be doing about the same in regard to the ankle as well as the gluteal area. She apparently has issues with the one back staying in place and she states the sill seems to break up the inferior aspect. She does have home help coming out to apply the wound VAC. She has been tolerating the dressing changes without any problem although she has had some increased pain in the gluteal region due to a fall she sustained and she feels like she brews that area and she had pain actually radiating down her leg which is slowly beginning to improve. There's no evidence of infection. 05/29/17 Unfortunately on evaluation today patient's wounds both in regard to the ankle as well as in regard to her gluteal region on the right appear to be measuring the same as last week's evaluation. She also tells me that she is having soreness today in regard to the right gluteal area because she had a visitor who came to see her a couple days ago and stayed for six hours and she had to set up for that entire length of time visiting. This has called some increased discomfort. Normally she does not  set up for those long periods of time. Fortunately there's no evidence of infection and no worsening of the wounds. 06/13/2017 -- the patient has had a lot of health issues including perfuse diarrhea and generalized weakness and I have instructed her to see her PCP as soon as possible and if things get worse to go to the ER. She has not brought her wound VAC today. She continues to be on oral antibiotics 06/30/17- she is here in follow up  evaluation for right ischial and right lateral malleolus ulcer. She continues with negative pressure wound therapy to the ischial wound. She is currently on a diabetic therapy per infectious disease, although she does state that she inconsistently takes it. She was advised to take her antibiotic as prescribed 07/11/2017 -- she has been unable to use a wound VAC regularly because she loses this evening and it has not been possible for her to use it. 07/18/2017 -- she is much more comfortable and looks better without the wound VAC and at this stage I believe it will be better for her to return the machine and continue with local care. 08/07/2017 -- she is back up to 3 weeks due to a vacation and during this time her wound VAC was returned. She has been doing local dressings 08/15/17 on evaluation today patient appears to be doing about the same in regard to her ankle as well as right gluteal wound. She has been tolerating the dressing changes and tells me that it's mainly her gluteal wound that hurts the ankle is nontender to palpation. She rates this pain to be a one out of 10 at rest and with cleansing of the wound five out of 10 at least. No fevers, chills, nausea, or vomiting noted at this time. She does note that home health has told her to inquire about possibly using silver nitrate on the wound edges due to the road nature of the edges. 08/29/2017 -- -- I got a note which Dr. Lucianne Lei dam sent to Dr. 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 He had recommended definitive surgery to fix this. He recommended to continue with Bactrim Right ankle x-ray -- IMPRESSION: Findings compatible with cellulitis of the lateral malleolar region. A small focus of bony destruction involving the tip of the lateral malleolus is worrisome for osteomyelitis. 09/05/2017 -- the patient was seen by Dr. Meridee Score yesterday who thought her  wound looked good and he would continue local care with silver alginate dressing and follow-up in 4 weeks. He did not recommend any surgical intervention of the ankle until there is good arterial flow and he would await Dr. Stephens Shire opinion regarding this. She was also seen by infectious disease Dr. Lucianne Lei dam, who was concerned about her elevated potassium and took her off Bactrim. he has recommended doxycycline. 09/26/2017 -- she is back after 3 weeks as she was out of town on vacation. She has put on some weight but other than that no change in her health 10/24/2017 -- she was seen by Dr. [name] -- after a thorough review he plans a angiographically via the left femoral approach to evaluate her left subclavian artery and address any stenosis at that time. He will also perform bilateral runoff to confirm that a right femoropopliteal as well as a left femoropopliteal bypass graft remained patent. He is then considering a right axillary to femoral bypass graft for limb salvage. [name], [name] ([id number]) ==== 12/18/17-she  is here in follow-up evaluation for multiple wounds. She is s/p aortobifemoral and left fem-pop revision. She presents with multiple necrotic areas to the left toes and lateral foot. The wounds we were originally seen her for (right heel and left buttock) are improved/stable. According to her husband the wounds to the left foot are "better" than they were upon discharge from the hospital last Tuesday. He states that Dr. Trula Slade saw her on same day of discharge. She has a follow-up with Dr. Trula Slade on Monday. There are dopplerable pulses to the left dorsalis pedis, posterior tibial, peroneal. We communicated with Dr. Stephens Shire nurse regarding today's findings, photos were sent to her email and the patient will maintain her appointment on Monday. We discussed with the patient and her husband to report to [facility] in [place] for any concerns, changes, new areas of  necrosis, new areas or progressive ischemia/purple discoloration, pallor, new/increased pain. We will anticipate follow up next week 12/30/17 patient unfortunately continues to have multiple wound of her lower extremities and specifically her left toes and lateral foot. She does have regular follow-up visits with Dr. Trula Slade who performed her surgery and at this point they are just holding to wait and see what needs to be done in regard to her foot such as what's going to heal and what may need to have potential amputation of further surgery. For this reason we are just trying to maintain and see were things end up at this point. Fortunately patient is not having any significant discomfort. The right lateral malleolus appears to be doing about the same there is some granulation tissue she does have osteomyelitis and not ulcer. Electronic Signature(s) Signed: 12/30/2017 5:22:42 PM By: Worthy Keeler PA-C Entered By: Worthy Keeler on 12/30/2017 16:52:16 Tarter, [name] ([id number]) -------------------------------------------------------------------------------- Physical Exam Details Patient Name: [name], [name] Date of Service: 12/30/2017 2:15 PM Medical Record Number: [id number] Patient Account Number: [id number] Date of Birth/Sex: Sep 23, 1966 (52 y.o. Female) Treating RN: Carolyne Fiscal, Debi Primary Care Provider: Delight Stare Other Clinician: Referring Provider: Delight Stare Treating Provider/Extender: STONE III, HOYT Weeks in Treatment: 1 Constitutional Well-nourished and well-hydrated in no acute distress. Respiratory normal breathing without difficulty. clear to auscultation bilaterally. Cardiovascular regular rate and rhythm with normal S1, S2. Psychiatric this patient is able to make decisions and demonstrates good insight into disease process. Alert and Oriented x 3. pleasant and cooperative. Notes Patient's wounds of the left foot appear to be necrotic for the most part and we  have been painting with Betadine at this point. Patient's husband is present during evaluation. Subsequently the right lateral malleolus is slough covered but fortunately this cleaned off easily just with wiping and cleaning the wound with saline and gauze and there did appear to be good granulation underneath in regard to the wound bed. Electronic Signature(s) Signed: 12/30/2017 5:22:42 PM By: Worthy Keeler PA-C Entered By: Worthy Keeler on 12/30/2017 16:53:03 Gagen, [name] ([id number]) -------------------------------------------------------------------------------- Physician Orders Details Patient Name: [name], [name]. Date of Service: 12/30/2017 2:15 PM Medical Record Number: [id number] Patient Account Number: [id number] Date of Birth/Sex: 11-02-1966 (52 y.o. 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 Wound #3 Right,Lateral Malleolus o Clean wound with Normal Saline. Wound #4 Left,Lateral Calcaneus o Clean wound with Normal Saline. Wound #5 Right,Circumferential Toe Great o Clean wound with Normal Saline. Wound #6 Left,Circumferential Toe Fourth o Clean wound with Normal Saline. Wound #7 Right Gluteus o Clean wound with Normal Saline. Wound #8 Left,Lateral Foot o Clean wound with Normal  Saline. Wound #9 Left Toe Third o Clean wound with Normal Saline. Anesthetic (add to Medication List) Wound #10 Left,Circumferential Toe Fifth o Topical Lidocaine 4% cream applied to wound bed prior to debridement (In Clinic Only). Wound #3 Right,Lateral Malleolus o Topical Lidocaine 4% cream applied to wound bed prior to debridement (In Clinic Only). Wound #4 Left,Lateral Calcaneus o Topical Lidocaine 4% cream applied to wound bed prior to debridement (In Clinic Only). Wound #5 Right,Circumferential Toe [place] Sima, Kayren S. ([id number]) o Topical Lidocaine 4% cream applied to wound bed prior to debridement (In Clinic Only). Wound #6 Left,Circumferential Toe Fourth o Topical Lidocaine 4% cream applied to wound bed prior to debridement (In Clinic Only). Wound #7 Right Gluteus o Topical Lidocaine 4% cream applied to wound bed prior to debridement (In Clinic Only). Wound #8 Left,Lateral Foot o Topical Lidocaine 4% cream applied to wound bed prior to debridement (In Clinic Only). Skin Barriers/Peri-Wound Care Wound #3 Right,Lateral Malleolus o Skin Prep Wound #7 Right Gluteus o Skin Prep Primary Wound Dressing Wound #3 Right,Lateral Malleolus o Prisma Ag - moisten with saline Wound #4 Left,Lateral Calcaneus o Other: - betadine or providone-iodine (paint on wound) Wound #5 Right,Circumferential Toe Great o Other: - betadine or providone-iodine (paint on wound) Wound #10 Left,Circumferential Toe Fifth o Silvercel Non-Adherent - silver alginate o Other: - betadine or providone-iodine (paint on wound) before you place on silver alginate Wound #6 Left,Circumferential Toe Fourth o Silvercel Non-Adherent - silver alginate o Other: - betadine or providone-iodine (paint on wound) before you place on silver alginate Wound #7 Right Gluteus o Prisma Ag - moisten with saline Wound #8 Left,Lateral Foot o Silvercel Non-Adherent - silver alginate o Other:  - betadine or providone-iodine (paint on wound) first Wound #9 Left Toe Third o Other: - betadine or providone-iodine (paint on wound) Secondary Dressing Wound #3 Right,Lateral Malleolus o Boardered Foam Dressing Wound #4 Left,Lateral Calcaneus o ABD pad o Conform/Kerlix Wound #10 Left,Circumferential Toe Fifth o ABD pad [name], [name] ([id number]) o Conform/Kerlix Wound #5 Right,Circumferential Toe Great o ABD pad o Conform/Kerlix Wound #6 Left,Circumferential Toe Fourth o ABD pad o Conform/Kerlix Wound #7 Right Gluteus o Boardered Foam Dressing Wound #8 Left,Lateral Foot o ABD pad o Conform/Kerlix Wound #9 Left Toe Third o ABD pad o Conform/Kerlix Dressing Change Frequency Wound #10 Left,Circumferential Toe Fifth o Change dressing every other day. o Other: - PRN Wound #3 Right,Lateral Malleolus o Change dressing every other day. o Other: - PRN Wound #4 Left,Lateral Calcaneus o Change dressing every other day. o Other: - PRN Wound #5 Right,Circumferential Toe Great o Change dressing every other day. o Other: - PRN Wound #6 Left,Circumferential Toe Fourth o Change dressing every other day. o Other: - PRN Wound #7 Right Gluteus o Change dressing every other day. o Other: - PRN Wound #8 Left,Lateral Foot o Change dressing every other day. o Other: - PRN Wound #9 Left Toe Third o  Change dressing every other day. o Other: - PRN Follow-up Appointments o Return Appointment in 1 week. [name], [name] ([id number]) Off-Loading Wound #10 Left,Circumferential Toe Fifth o Turn and reposition every 2 hours Wound #3 Right,Lateral Malleolus o Turn and reposition every 2 hours Wound #4 Left,Lateral Calcaneus o Turn and reposition every 2 hours Wound #5 Right,Circumferential Toe Great o Turn and reposition every 2 hours Wound #6 Left,Circumferential Toe Fourth o Turn and reposition every 2  hours Wound #7 Right Gluteus o Turn and reposition every 2 hours Wound #8 Left,Lateral Foot o Turn and reposition every 2 hours Wound #9 Left Toe Third o Turn and reposition every 2 hours Additional Orders / Instructions Wound #10 Left,Circumferential Toe Fifth o Increase protein intake. Wound #3 Right,Lateral Malleolus o Increase protein intake. Wound #4 Left,Lateral Calcaneus o Increase protein intake. Wound #5 Right,Circumferential Toe Great o Increase protein intake. Wound #6 Left,Circumferential Toe Fourth o Increase protein intake. Wound #7 Right Gluteus o Increase protein intake. Wound #8 Left,Lateral Foot o Increase protein intake. Wound #9 Left Toe Third o Increase protein intake. Home Health Wound #10 Left,Circumferential Toe [place] Nurse may visit PRN to address patientos wound care needs. o FACE TO FACE ENCOUNTER: MEDICARE and MEDICAID PATIENTS: I certify that this patient is under my care and that I had a face-to-face encounter that meets the physician face-to-face encounter requirements with this patient on this date. The encounter with the patient was in whole or in part for the following [place], JAZLINE CUMBEE. ([id number]) CONDITION: (primary reason for Home Healthcare) MEDICAL NECESSITY: I certify, that based on my findings, NURSING services are a medically necessary home health service. HOME BOUND STATUS: I certify that my clinical findings support that this patient is homebound (i.e., Due to illness or injury, pt requires aid of supportive devices such as crutches, cane, wheelchairs, walkers, the use of special transportation or the assistance of another person to leave their place of residence. There is a normal inability to leave the home and doing so requires considerable and taxing effort. Other absences are for medical reasons / religious services and are infrequent or of short  duration when for other reasons). o If current dressing causes regression in wound condition, may D/C ordered dressing product/s and apply Normal Saline Moist Dressing daily until next [place] / Other MD appointment. [place] of regression in wound condition at [phone]. o Please direct any NON-WOUND related issues/requests for orders to patient's Primary Care Physician Wound #3 [place] Nurse may visit PRN to address patientos wound care needs. o FACE TO FACE ENCOUNTER: MEDICARE and MEDICAID PATIENTS: I certify that this patient is under my care and that I had a face-to-face encounter that meets the physician face-to-face encounter requirements with this patient on this date. The encounter with the patient was in whole or in part for the following MEDICAL CONDITION: (primary reason for [place]) MEDICAL NECESSITY: I certify, that based on my findings, NURSING services are a medically necessary home health service. HOME BOUND STATUS: I certify that my clinical findings support that this patient is homebound (i.e., Due to illness or injury, pt requires aid of supportive devices such as crutches, cane, wheelchairs, walkers, the use of special transportation or the assistance of another person to leave their place of residence. There is a normal inability to leave the home and doing so requires considerable and  taxing effort. Other absences are for medical reasons / religious services and are infrequent or of short duration when for other reasons). o If current dressing causes regression in wound condition, may D/C ordered dressing product/s and apply Normal Saline Moist Dressing daily until next [place] / Other MD appointment. [place] of regression in wound condition at [phone]. o Please direct any NON-WOUND related issues/requests for orders to  patient's Primary Care Physician Wound #4 [place] Nurse may visit PRN to address patientos wound care needs. o FACE TO FACE ENCOUNTER: MEDICARE and MEDICAID PATIENTS: I certify that this patient is under my care and that I had a face-to-face encounter that meets the physician face-to-face encounter requirements with this patient on this date. The encounter with the patient was in whole or in part for the following MEDICAL CONDITION: (primary reason for [place]) MEDICAL NECESSITY: I certify, that based on my findings, NURSING services are a medically necessary home health service. HOME BOUND STATUS: I certify that my clinical findings support that this patient is homebound (i.e., Due to illness or injury, pt requires aid of supportive devices such as crutches, cane, wheelchairs, walkers, the use of special transportation or the assistance of another person to leave their place of residence. There is a normal inability to leave the home and doing so requires considerable and taxing effort. Other absences are for medical reasons / religious services and are infrequent or of short duration when for other reasons). o If current dressing causes regression in wound condition, may D/C ordered dressing product/s and apply Normal Saline Moist Dressing daily until next [place] / Other MD appointment. [place] of regression in wound condition at [phone]. o Please direct any NON-WOUND related issues/requests for orders to patient's Primary Care Physician Wound #5 Right,Circumferential Toe [place] Nurse may visit PRN to address patientos wound care needs. o FACE TO FACE ENCOUNTER: MEDICARE and MEDICAID PATIENTS: I certify that this patient is under my care and that I had a face-to-face encounter that meets the physician face-to-face encounter  requirements with this patient on this date. The encounter with the patient was in whole or in part for the following MEDICAL CONDITION: (primary reason for [place]) MEDICAL NECESSITY: I certify, that based on my findings, NURSING services are a medically necessary home health service. HOME BOUND STATUS: I certify that my clinical findings support that this patient is homebound (i.e., Due to illness or injury, pt requires aid of supportive devices such as crutches, cane, wheelchairs, walkers, the use of special transportation or the assistance of another person to leave their place of residence. There is a normal inability to leave the home Bodner, Cement. ([id number]) and doing so requires considerable and taxing effort. Other absences are for medical reasons / religious services and are infrequent or of short duration when for other reasons). o If current dressing causes regression in wound condition, may D/C ordered dressing product/s and apply Normal Saline Moist Dressing daily until next [place] / Other MD appointment. [place] of regression in wound condition at [phone]. o Please direct any NON-WOUND related issues/requests for orders to patient's Primary Care Physician Wound #6 Left,Circumferential Toe [place] Nurse may visit PRN to address patientos wound care needs. o FACE TO FACE ENCOUNTER: MEDICARE and MEDICAID PATIENTS: I certify  that this patient is under my care and that I had a face-to-face encounter that meets the physician face-to-face encounter requirements with this patient on this date. The encounter with the patient was in whole or in part for the following MEDICAL CONDITION: (primary reason for [place]) MEDICAL NECESSITY: I certify, that based on my findings, NURSING services are a medically necessary home health service. HOME BOUND STATUS: I certify that my clinical  findings support that this patient is homebound (i.e., Due to illness or injury, pt requires aid of supportive devices such as crutches, cane, wheelchairs, walkers, the use of special transportation or the assistance of another person to leave their place of residence. There is a normal inability to leave the home and doing so requires considerable and taxing effort. Other absences are for medical reasons / religious services and are infrequent or of short duration when for other reasons). o If current dressing causes regression in wound condition, may D/C ordered dressing product/s and apply Normal Saline Moist Dressing daily until next [place] / Other MD appointment. [place] of regression in wound condition at [phone]. o Please direct any NON-WOUND related issues/requests for orders to patient's Primary Care Physician Wound #7 Right [place] Nurse may visit PRN to address patientos wound care needs. o FACE TO FACE ENCOUNTER: MEDICARE and MEDICAID PATIENTS: I certify that this patient is under my care and that I had a face-to-face encounter that meets the physician face-to-face encounter requirements with this patient on this date. The encounter with the patient was in whole or in part for the following MEDICAL CONDITION: (primary reason for [place]) MEDICAL NECESSITY: I certify, that based on my findings, NURSING services are a medically necessary home health service. HOME BOUND STATUS: I certify that my clinical findings support that this patient is homebound (i.e., Due to illness or injury, pt requires aid of supportive devices such as crutches, cane, wheelchairs, walkers, the use of special transportation or the assistance of another person to leave their place of residence. There is a normal inability to leave the home and doing so requires considerable and taxing effort. Other absences are  for medical reasons / religious services and are infrequent or of short duration when for other reasons). o If current dressing causes regression in wound condition, may D/C ordered dressing product/s and apply Normal Saline Moist Dressing daily until next [place] / Other MD appointment. [place] of regression in wound condition at [phone]. o Please direct any NON-WOUND related issues/requests for orders to patient's Primary Care Physician Wound #8 [place] Visits o Home Health Nurse may visit PRN to address patientos wound care needs. o FACE TO FACE ENCOUNTER: MEDICARE and MEDICAID PATIENTS: I certify that this patient is under my care and that I had a face-to-face encounter that meets the physician face-to-face encounter requirements with this patient on this date. The encounter with the patient was in whole or in part for the following MEDICAL CONDITION: (primary reason for [place]) MEDICAL NECESSITY: I certify, that based on my findings, NURSING services are a medically necessary home health service. HOME BOUND STATUS: I certify that my clinical findings support that this patient is homebound (i.e., Due to illness or injury, pt requires aid of supportive devices such as crutches, cane, wheelchairs, walkers, the use of special transportation or the assistance of another person to leave their place of residence.  There is a normal inability to leave the home and doing so requires considerable and taxing effort. Other absences are for medical reasons / religious services and are infrequent or of short duration when for other reasons). o If current dressing causes regression in wound condition, may D/C ordered dressing product/s and apply Normal Saline Moist Dressing daily until next [place] / Other MD appointment. [place] of regression in wound condition at  [phone]. MORIAH, SHAWLEY ([id number]) o Please direct any NON-WOUND related issues/requests for orders to patient's Primary Care Physician Wound #9 Left Toe [place] Nurse may visit PRN to address patientos wound care needs. o FACE TO FACE ENCOUNTER: MEDICARE and MEDICAID PATIENTS: I certify that this patient is under my care and that I had a face-to-face encounter that meets the physician face-to-face encounter requirements with this patient on this date. The encounter with the patient was in whole or in part for the following MEDICAL CONDITION: (primary reason for [place]) MEDICAL NECESSITY: I certify, that based on my findings, NURSING services are a medically necessary home health service. HOME BOUND STATUS: I certify that my clinical findings support that this patient is homebound (i.e., Due to illness or injury, pt requires aid of supportive devices such as crutches, cane, wheelchairs, walkers, the use of special transportation or the assistance of another person to leave their place of residence. There is a normal inability to leave the home and doing so requires considerable and taxing effort. Other absences are for medical reasons / religious services and are infrequent or of short duration when for other reasons). o If current dressing causes regression in wound condition, may D/C ordered dressing product/s and apply Normal Saline Moist Dressing daily until next [place] / Other MD appointment. [place] of regression in wound condition at [phone]. o Please direct any NON-WOUND related issues/requests for orders to patient's Primary Care Physician Patient Medications Allergies: Documentation for allergies has not been submitted Notifications Medication Indication Start End lidocaine DOSE 1 - topical 4 % cream - 1 cream topical Electronic Signature(s) Signed: 12/30/2017 5:08:07 PM  By: Gretta Cool, [name], RN, CWS, [name], [name] Signed: 12/30/2017 5:22:42 PM By: Worthy Keeler PA-C Entered By: Gretta Cool, [name], RN, CWS, Kim on 12/30/2017 15:28:12 MAGAN, WINNETT ([id number]) -------------------------------------------------------------------------------- Prescription 12/30/2017 Patient Name: [name], [name]. Provider: Worthy Keeler PA-C Date of Birth: 05-05-1966 NPI#: [id number] Sex: F DEA#: [id number] Phone #: [phone] License #: Patient Address: [facility] [place], [place] [address] [address], [place], [place] [address] [phone] Allergies Medication Medication: Route: Strength: Form: lidocaine 4 % topical cream topical 4% cream Class: TOPICAL LOCAL ANESTHETICS Dose: Frequency / Time: Indication: 1 1 cream topical Number of Refills: Number of Units: 0 Generic Substitution: Start Date: End Date: One Time Use: Substitution Permitted No Note to Pharmacy: Signature(s): Date(s): Electronic Signature(s) Signed: 12/30/2017 5:08:07 PM By: Gretta Cool, [name], RN, CWS, [name], [name] Signed: 12/30/2017 5:22:42 PM By: Worthy Keeler PA-C Entered By: Gretta Cool, [name], RN, CWS, Kim on 12/30/2017 15:28:16 Wittner, [name] ([id number]) --------------------------------------------------------------------------------  Problem List Details Patient Name: ISELLA, SLATTEN. Date of Service: 12/30/2017 2:15 PM Medical Record Number: [id number] Patient Account Number: [id number] Date of Birth/Sex: 02-Sep-1966 (52 y.o. 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 ([id number]) -------------------------------------------------------------------------------- Progress Note Details Patient Name: [name]. Date of Service: 12/30/2017 2:15 PM Medical Record Number: [id number] Patient Account Number: [id number] Date of Birth/Sex: 10-07-1966 (52 y.o. Female) Treating RN: Carolyne Fiscal, Debi Primary Care Provider: Delight Stare Other Clinician: Referring Provider: Delight Stare Treating Provider/Extender: Melburn Hake, HOYT Weeks in Treatment: 1 Subjective Chief Complaint Information obtained from Patient She is here in follow up evaluation for multiple wounds History of Present Illness (HPI) 52 year old patient was sent to [place] from [facility] where she was seen by Dr. Sherril Cong for a left ankle ulceration and was recently hospitalized with hypotension and sepsis. She was treated with IV antibiotics and has been scheduled to see Dr. Sharol Given. She was seen by vascular surgery who recommended a femoral bypass but surgery has been delayed until her sacral wound from last year's necrotizing fasciitis has healed. Was seeing the wound care team at [facility] but wanted to change over. She is a smoker and smokes a pack of cigarettes a day The patient was recently admitted in [place] between February 2 and February 14. She had a follow-up to see vascular surgery, orthopedic surgery and infectious disease. During her admission she was known to have peripheral  arterial disease, diabetes mellitus with neuropathy, chronic pain, open wound with necrotizing fasciitis of the sacral area which had been there since November 2017 Past medical history significant for diabetes mellitus, ankle ulcer, sacral ulcer, necrotizing fasciitis, arterial occlusive disease, tobacco abuse. Review of the electronic medical records reveals that Dr. 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 An MRI showed edema in the bone which was consistent with osteomyelitis and the chronic ulcer and may need surgical intervention. She also had a sacral decubitus ulcer which was treated with the wound VAC. On 01/07/2017 she was taken up by the vascular surgeon Dr. Trula Slade for an abdominal aortogram and bilateral lower extremity runoff, for a history of having bilateral femoropopliteal bypass graft as well as external iliac stenting on the right and stenting of her bypass graft. She had developed a nonhealing wound on her right ankle and there was a possibility of a femoral occlusion. the findings were noted and the impression was a surgical revascularization with a aorto bifemoral bypass graft. Both the femoropopliteal bypass grafts were patent. 01/31/2017 -- x-ray of the right hip and pelvis -- IMPRESSION:No radiographic evidence of acute osteomyelitis. Normal-appearing right hip joint space for age. No acute bony abnormality of the hip. 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 Since seeing her last week she has not had an appointment with infectious disease or the orthopedic specialist yet. 02/06/2017 -- the patient missed a couple of appointments yesterday due to the weather but other than that has  apparently given up smoking for the last 5 days. 02/13/2017 -- she has rescheduled her infectious disease appointment and  also the appointment with the orthopedic surgeon Dr. Sharol Given 03/06/2017 -- she was seen by Dr. Lucianne Lei dam of infectious disease on 03/05/2017 and he recommendedIV antibiotics but the patient did not want to have that and he has given her Augmentin and doxycycline and will reevaluate her in 2 months time. 03/13/2017 -- he saw Dr. Trula Slade regarding her vascular issues and he would like to wait to the sacral wound is completely closed before considering aortobifemoral bypass graft. He will see her back in 2 months time. She was also seen by Dr. Sharol Given of orthopedics who recommended continue wound care dressings and follow in the office in about 2 months time. he also recommended 3 view radiographs of the right ankle at follow-up. 03/28/2017 -- she did have a PICC line placed and Dr. Lucianne Lei dam has begun IV vancomycin and ceftriaxone. she is going to continue this until she sees him in approximately a month's time 04/18/2017 -- we had applied for a skin substitute for the patient's care but her copayment is going to be about $300 a piece and the patient will not be able to afford this. 05/08/17 on evaluation today patient appears to potentially have a fungal rash in the periwound region of the sacral area. This [name], [name] ([id number]) also seems to extend into the inguinal creases and factional region as well. She is tender to palpation with light rubbing over the region of the periwound and the skin in this region does have a beefy red appearance. Everything seems to point to a fungal infection at this time. She has been tolerating the dressing changes up to this point. There is no evidence of infection otherwise. 05/16/2017 -- was seen by Dr. 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 He did not think that she is making progress and was not able to examine her sacral decubitus ulcer as the patient would not allow it. She  was switched to Bactrim DS 2 tablets twice a day since finishing her antibiotics. Her ESR was 76 and a CRP was 2.8. 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 05/23/17 on evaluation today patient's wound appears to be doing about the same in regard to the ankle as well as the gluteal area. She apparently has issues with the one back staying in place and she states the sill seems to break up the inferior aspect. She does have home help coming out to apply the wound VAC. She has been tolerating the dressing changes without any problem although she has had some increased pain in the gluteal region due to a fall she sustained and she feels like she brews that area and she had pain actually radiating down her leg which is slowly beginning to improve. There's no evidence of infection. 05/29/17 Unfortunately on evaluation today patient's wounds both in regard to the ankle as well as in regard to her gluteal region on the right appear to be measuring the same as last week's evaluation. She also tells me that she is having soreness today in regard to the right gluteal area because she had a visitor who came to see her a couple days ago and stayed for six hours and she had to set  up for that entire length of time visiting. This has called some increased discomfort. Normally she does not set up for those long periods of time. Fortunately there's no evidence of infection and no worsening of the wounds. 06/13/2017 -- the patient has had a lot of health issues including perfuse diarrhea and generalized weakness and I have instructed her to see her PCP as soon as possible and if things get worse to go to the ER. She has not brought her wound VAC today. She continues to be on oral antibiotics 06/30/17- she is here in follow up evaluation for right ischial and right lateral malleolus ulcer. She continues with  negative pressure wound therapy to the ischial wound. She is currently on a diabetic therapy per infectious disease, although she does state that she inconsistently takes it. She was advised to take her antibiotic as prescribed 07/11/2017 -- she has been unable to use a wound VAC regularly because she loses this evening and it has not been possible for her to use it. 07/18/2017 -- she is much more comfortable and looks better without the wound VAC and at this stage I believe it will be better for her to return the machine and continue with local care. 08/07/2017 -- she is back up to 3 weeks due to a vacation and during this time her wound VAC was returned. She has been doing local dressings 08/15/17 on evaluation today patient appears to be doing about the same in regard to her ankle as well as right gluteal wound. She has been tolerating the dressing changes and tells me that it's mainly her gluteal wound that hurts the ankle is nontender to palpation. She rates this pain to be a one out of 10 at rest and with cleansing of the wound five out of 10 at least. No fevers, chills, nausea, or vomiting noted at this time. She does note that home health has told her to inquire about possibly using silver nitrate on the wound edges due to the road nature of the edges. 08/29/2017 -- -- I got a note which Dr. Lucianne Lei dam sent to Dr. 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 He had recommended definitive surgery to fix this. He recommended to continue with Bactrim Right ankle x-ray -- IMPRESSION: Findings compatible with cellulitis of the lateral malleolar region. A small focus of bony destruction involving the tip of the lateral malleolus is worrisome for osteomyelitis. 09/05/2017 -- the patient was seen by Dr. Meridee Score yesterday who thought her wound looked good and he would continue local care with silver alginate dressing  and follow-up in 4 weeks. He did not recommend any surgical intervention of the ankle until there is good arterial flow and he would await Dr. Stephens Shire opinion regarding this. She was also seen by infectious disease Dr. Lucianne Lei dam, who was concerned about her elevated potassium and took her off Bactrim. he has recommended doxycycline. 09/26/2017 -- she is back after 3 weeks as she was out of town on vacation. She has put on some weight but other than that JOZALYNN, NOYCE. ([id number]) no change in her health 10/24/2017 -- she was seen by Dr. [name] -- after a thorough review he plans a angiographically via the left femoral approach to evaluate her left subclavian artery and address any stenosis at that time. He will also perform bilateral runoff to confirm that a right femoropopliteal as well as a left femoropopliteal bypass graft  remained patent. He is then considering a right axillary to femoral bypass graft for limb salvage. ==== 12/18/17-she is here in follow-up evaluation for multiple wounds. She is s/p aortobifemoral and left fem-pop revision. She presents with multiple necrotic areas to the left toes and lateral foot. The wounds we were originally seen her for (right heel and left buttock) are improved/stable. According to her husband the wounds to the left foot are "better" than they were upon discharge from the hospital last Tuesday. He states that Dr. Trula Slade saw her on same day of discharge. She has a follow-up with Dr. Trula Slade on Monday. There are dopplerable pulses to the left dorsalis pedis, posterior tibial, peroneal. We communicated with Dr. Stephens Shire nurse regarding today's findings, photos were sent to her email and the patient will maintain her appointment on Monday. We discussed with the patient and her husband to report to [facility] in [place] for any concerns, changes, new areas of necrosis, new areas or progressive ischemia/purple discoloration, pallor,  new/increased pain. We will anticipate follow up next week 12/30/17 patient unfortunately continues to have multiple wound of her lower extremities and specifically her left toes and lateral foot. She does have regular follow-up visits with Dr. Trula Slade who performed her surgery and at this point they are just holding to wait and see what needs to be done in regard to her foot such as what's going to heal and what may need to have potential amputation of further surgery. For this reason we are just trying to maintain and see were things end up at this point. Fortunately patient is not having any significant discomfort. The right lateral malleolus appears to be doing about the same there is some granulation tissue she does have osteomyelitis and not ulcer. Patient History Information obtained from Patient. Family History Diabetes - Mother,Father,Maternal Grandparents,Paternal Grandparents,Child, Heart Disease - [place] Grandparents, Hereditary Spherocytosis - Mother, Hypertension - Mother,Siblings,Paternal Grandparents,Maternal Grandparents, Seizures - Maternal Grandparents,Paternal Grandparents, Thyroid Problems - Siblings, No family history of Cancer, Kidney Disease, Lung Disease, Stroke, Tuberculosis. Social History Current every day smoker, Marital Status - Married, Alcohol Use - Rarely, Drug Use - No History, Caffeine Use - Moderate. Review of Systems (ROS) Constitutional Symptoms (General Health) Denies complaints or symptoms of Fever, Chills. Respiratory The patient has no complaints or symptoms. Cardiovascular The patient has no complaints or symptoms. Psychiatric The patient has no complaints or symptoms. Objective Constitutional [name], [name] ([id number]) Well-nourished and well-hydrated in no acute distress. Vitals Time Taken: 2:37 PM, Height: 67 in, Temperature: 98.2 F, Pulse: 93 bpm, Respiratory Rate: 16 breaths/min, Blood Pressure:  84/54 mmHg. Respiratory normal breathing without difficulty. clear to auscultation bilaterally. Cardiovascular regular rate and rhythm with normal S1, S2. Psychiatric this patient is able to make decisions and demonstrates good insight into disease process. Alert and Oriented x 3. pleasant and cooperative. General Notes: Patient's wounds of the left foot appear to be necrotic for the most part and we have been painting with Betadine at this point. Patient's husband is present during evaluation. Subsequently the right lateral malleolus is slough covered but fortunately this cleaned off easily just with wiping and cleaning the wound with saline and gauze and there did appear to be good granulation underneath in regard to the wound bed. Integumentary (Hair, Skin) Wound #10 status is Open. Original cause of wound was Gradually Appeared. The wound is located on the Left,Circumferential Toe Fifth. The wound measures 3cm length x 4cm width x 0.1cm depth; 9.425cm^2 area and 0.942cm^3  volume. There is no tunneling or undermining noted. There is a medium amount of serosanguineous drainage noted. The wound margin is distinct with the outline attached to the wound base. There is no granulation within the wound bed. There is a large (67-100%) amount of necrotic tissue within the wound bed including Eschar and Adherent Slough. Periwound temperature was noted as No Abnormality. The periwound has tenderness on palpation. Wound #3 status is Open. Original cause of wound was Pressure Injury. The wound is located on the Right,Lateral Malleolus. The wound measures 0.9cm length x 1.2cm width x 0.2cm depth; 0.848cm^2 area and 0.17cm^3 volume. There is no tunneling or undermining noted. There is a large amount of serous drainage noted. The wound margin is distinct with the outline attached to the wound base. There is medium (34-66%) pink granulation within the wound bed. There is a medium (34-66%) amount of necrotic  tissue within the wound bed including Adherent Slough. The periwound skin appearance exhibited: Maceration. Periwound temperature was noted as No Abnormality. Wound #4 status is Open. Original cause of wound was Blister. The wound is located on the Left,Lateral Calcaneus. The wound measures 6cm length x 2cm width x 0.4cm depth; 9.425cm^2 area and 3.77cm^3 volume. There is no tunneling or undermining noted. There is a none present amount of drainage noted. The wound margin is distinct with the outline attached to the wound base. There is no granulation within the wound bed. There is a large (67-100%) amount of necrotic tissue within the wound bed including Eschar and Adherent Slough. The periwound skin appearance exhibited: Maceration. Periwound temperature was noted as No Abnormality. The periwound has tenderness on palpation. Wound #5 status is Open. Original cause of wound was Gradually Appeared. The wound is located on the Right,Circumferential Toe Great. The wound measures 5cm length x 4.5cm width x 0.1cm depth; 17.671cm^2 area and 1.767cm^3 volume. There is no tunneling or undermining noted. There is a none present amount of drainage noted. The wound margin is distinct with the outline attached to the wound base. There is no granulation within the wound bed. There is a large (67-100%) amount of necrotic tissue within the wound bed including Eschar and Adherent Slough. Periwound temperature was noted as No Abnormality. Wound #6 status is Open. Original cause of wound was Gradually Appeared. The wound is located on the Left,Circumferential Toe Fourth. The wound measures 1.5cm length x 6cm width x 0.1cm depth; 7.069cm^2 area and 0.707cm^3 volume. There is no tunneling or undermining noted. There is a large amount of serosanguineous drainage noted. The wound margin is distinct with the outline attached to the wound base. There is no granulation within the wound bed. There is a large (67-100%)  amount of necrotic tissue within the wound bed including Eschar and Adherent Slough. Periwound temperature was noted as No Abnormality. Wound #7 status is Open. Original cause of wound was Gradually Appeared. The wound is located on the Right Gluteus. The wound measures 1.2cm length x 1cm width x 0.6cm depth; 0.942cm^2 area and 0.565cm^3 volume. There is no tunneling or [name], [name]. ([id number]) undermining noted. There is a large amount of serosanguineous drainage noted. The wound margin is distinct with the outline attached to the wound base. There is large (67-100%) red granulation within the wound bed. There is a small (1-33%) amount of necrotic tissue within the wound bed including Adherent Slough. The periwound skin appearance exhibited: Scarring, Erythema. The surrounding wound skin color is noted with erythema which is circumferential. Periwound temperature was noted  as No Abnormality. The periwound has tenderness on palpation. Wound #8 status is Open. Original cause of wound was Gradually Appeared. The wound is located on the Left,Lateral Foot. The wound measures 7.5cm length x 4cm width x 0.1cm depth; 23.562cm^2 area and 2.356cm^3 volume. There is no tunneling or undermining noted. There is a medium amount of serosanguineous drainage noted. The wound margin is distinct with the outline attached to the wound base. There is no granulation within the wound bed. There is a large (67-100%) amount of necrotic tissue within the wound bed including Eschar. The periwound skin appearance exhibited: Maceration. Periwound temperature was noted as No Abnormality. Wound #9 status is Open. Original cause of wound was Gradually Appeared. The wound is located on the Left Toe Third. The wound measures 0.1cm length x 0.1cm width x 0.1cm depth; 0.008cm^2 area and 0.001cm^3 volume. There is no tunneling or undermining noted. There is a none present amount of drainage noted. The wound margin is distinct  with the outline attached to the wound base. There is no granulation within the wound bed. There is a large (67-100%) amount of necrotic tissue within the wound bed including Eschar. Periwound temperature was noted as No Abnormality. 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 Wound #3 Right,Lateral Malleolus: Clean wound with Normal Saline. Wound #4 Left,Lateral Calcaneus: Clean wound with Normal Saline. Wound #5 Right,Circumferential Toe Great: Clean wound with Normal Saline. Wound #6 Left,Circumferential Toe Fourth: Clean wound with Normal Saline. Wound #7 Right Gluteus: Clean wound with Normal Saline. Wound #8 Left,Lateral Foot: Clean wound with Normal Saline. Wound #9 Left Toe Third: Clean wound with Normal Saline. Anesthetic (add to Medication List): QUINCEE, GITTENS S. ([id number]) Wound #10 Left,Circumferential Toe Fifth: Topical Lidocaine 4% cream applied to wound bed prior to debridement (In Clinic Only). Wound #3 Right,Lateral Malleolus: Topical Lidocaine 4% cream applied to wound bed prior to debridement (In Clinic Only). Wound #4 Left,Lateral Calcaneus: Topical Lidocaine 4% cream applied to wound bed prior to debridement (In Clinic Only). Wound #5 Right,Circumferential Toe Great: Topical Lidocaine 4% cream applied to wound bed prior to debridement (In Clinic Only). Wound #6 Left,Circumferential Toe Fourth: Topical Lidocaine 4% cream applied to wound bed prior to debridement (In Clinic Only). Wound #7  Right Gluteus: Topical Lidocaine 4% cream applied to wound bed prior to debridement (In Clinic Only). Wound #8 Left,Lateral Foot: Topical Lidocaine 4% cream applied to wound bed prior to debridement (In Clinic Only). Skin Barriers/Peri-Wound Care: Wound #3 Right,Lateral Malleolus: Skin Prep Wound #7 Right Gluteus: Skin Prep Primary Wound Dressing: Wound #3 Right,Lateral Malleolus: Prisma Ag - moisten with saline Wound #4 Left,Lateral Calcaneus: Other: - betadine or providone-iodine (paint on wound) Wound #5 Right,Circumferential Toe Great: Other: - betadine or providone-iodine (paint on wound) Wound #10 Left,Circumferential Toe Fifth: [place] Non-Adherent - silver alginate Other: - betadine or providone-iodine (paint on wound) before you place on silver alginate Wound #6 Left,Circumferential Toe Fourth: [place] Non-Adherent - silver alginate Other: - betadine or providone-iodine (paint on wound) before you place on silver alginate Wound #7 Right Gluteus: Prisma Ag - moisten with saline Wound #8 Left,Lateral  Foot: [place] Non-Adherent - silver alginate Other: - betadine or providone-iodine (paint on wound) first Wound #9 Left Toe Third: Other: - betadine or providone-iodine (paint on wound) Secondary Dressing: Wound #3 Right,Lateral Malleolus: Boardered Foam Dressing Wound #4 Left,Lateral Calcaneus: ABD pad Conform/Kerlix Wound #10 Left,Circumferential Toe Fifth: ABD pad Conform/Kerlix Wound #5 Right,Circumferential Toe Great: ABD pad Conform/Kerlix Wound #6 Left,Circumferential Toe Fourth: ABD pad Conform/Kerlix Wound #7 Right Gluteus: Boardered Foam Dressing Wound #8 Left,Lateral Foot: ABD pad Conform/Kerlix Wound #9 Left Toe Third: Lords, Tifanie S. ([id number]) ABD pad Conform/Kerlix Dressing Change Frequency: Wound #10 Left,Circumferential Toe Fifth: Change dressing every other day. Other: - PRN Wound #3 Right,Lateral Malleolus: Change dressing every  other day. Other: - PRN Wound #4 Left,Lateral Calcaneus: Change dressing every other day. Other: - PRN Wound #5 Right,Circumferential Toe Great: Change dressing every other day. Other: - PRN Wound #6 Left,Circumferential Toe Fourth: Change dressing every other day. Other: - PRN Wound #7 Right Gluteus: Change dressing every other day. Other: - PRN Wound #8 Left,Lateral Foot: Change dressing every other day. Other: - PRN Wound #9 Left Toe Third: Change dressing every other day. Other: - PRN Follow-up Appointments: Return Appointment in 1 week. Off-Loading: Wound #10 Left,Circumferential Toe Fifth: Turn and reposition every 2 hours Wound #3 Right,Lateral Malleolus: Turn and reposition every 2 hours Wound #4 Left,Lateral Calcaneus: Turn and reposition every 2 hours Wound #5 Right,Circumferential Toe Great: Turn and reposition every 2 hours Wound #6 Left,Circumferential Toe Fourth: Turn and reposition every 2 hours Wound #7 Right Gluteus: Turn and reposition every 2 hours Wound #8 Left,Lateral Foot: Turn and reposition every 2 hours Wound #9 Left Toe Third: Turn and reposition every 2 hours Additional Orders / Instructions: Wound #10 Left,Circumferential Toe Fifth: Increase protein intake. Wound #3 Right,Lateral Malleolus: Increase protein intake. Wound #4 Left,Lateral Calcaneus: Increase protein intake. Wound #5 Right,Circumferential Toe Great: Increase protein intake. Wound #6 Left,Circumferential Toe Fourth: Increase protein intake. Wound #7 Right Gluteus: Increase protein intake. Wound #8 Left,Lateral Foot: Hennen, Sharol S. ([id number]) Increase protein intake. Wound #9 Left Toe Third: Increase protein intake. Home Health: Wound #10 Left,Circumferential Toe Fifth: [place] Nurse may visit PRN to address patient s wound care needs. FACE TO FACE ENCOUNTER: MEDICARE and MEDICAID PATIENTS: I certify that this patient is under my  care and that I had a face-to-face encounter that meets the physician face-to-face encounter requirements with this patient on this date. The encounter with the patient was in whole or in part for the following MEDICAL CONDITION: (primary reason for [place]) MEDICAL NECESSITY: I certify, that based on my findings, NURSING services are a medically necessary home health service. HOME BOUND STATUS: I certify that my clinical findings support that this patient is homebound (i.e., Due to illness or injury, pt requires aid of supportive devices such as crutches, cane, wheelchairs, walkers, the use of special transportation or the assistance of another person to leave their place of residence. There is a normal inability to leave the home and doing so requires considerable and taxing effort. Other absences are for medical reasons / religious services and are infrequent or of short duration when for other reasons). If current dressing causes regression in wound condition, may D/C ordered dressing product/s and apply Normal Saline Moist Dressing daily until next [place] / Other MD appointment. [place] of regression in wound condition at [phone]. Please direct any NON-WOUND related issues/requests for orders to patient's Primary Care Physician Wound #  3 Right,Lateral Malleolus: [place] Nurse may visit PRN to address patient s wound care needs. FACE TO FACE ENCOUNTER: MEDICARE and MEDICAID PATIENTS: I certify that this patient is under my care and that I had a face-to-face encounter that meets the physician face-to-face encounter requirements with this patient on this date. The encounter with the patient was in whole or in part for the following MEDICAL CONDITION: (primary reason for [place]) MEDICAL NECESSITY: I certify, that based on my findings, NURSING services are a medically necessary home health service. HOME BOUND  STATUS: I certify that my clinical findings support that this patient is homebound (i.e., Due to illness or injury, pt requires aid of supportive devices such as crutches, cane, wheelchairs, walkers, the use of special transportation or the assistance of another person to leave their place of residence. There is a normal inability to leave the home and doing so requires considerable and taxing effort. Other absences are for medical reasons / religious services and are infrequent or of short duration when for other reasons). If current dressing causes regression in wound condition, may D/C ordered dressing product/s and apply Normal Saline Moist Dressing daily until next [place] / Other MD appointment. [place] of regression in wound condition at [phone]. Please direct any NON-WOUND related issues/requests for orders to patient's Primary Care Physician Wound #4 Left,Lateral Calcaneus: [place] Nurse may visit PRN to address patient s wound care needs. FACE TO FACE ENCOUNTER: MEDICARE and MEDICAID PATIENTS: I certify that this patient is under my care and that I had a face-to-face encounter that meets the physician face-to-face encounter requirements with this patient on this date. The encounter with the patient was in whole or in part for the following MEDICAL CONDITION: (primary reason for [place]) MEDICAL NECESSITY: I certify, that based on my findings, NURSING services are a medically necessary home health service. HOME BOUND STATUS: I certify that my clinical findings support that this patient is homebound (i.e., Due to illness or injury, pt requires aid of supportive devices such as crutches, cane, wheelchairs, walkers, the use of special transportation or the assistance of another person to leave their place of residence. There is a normal inability to leave the home and doing so requires considerable and taxing  effort. Other absences are for medical reasons / religious services and are infrequent or of short duration when for other reasons). If current dressing causes regression in wound condition, may D/C ordered dressing product/s and apply Normal Saline Moist Dressing daily until next [place] / Other MD appointment. [place] of regression in wound condition at [phone]. Please direct any NON-WOUND related issues/requests for orders to patient's Primary Care Physician Wound #5 Right,Circumferential Toe Great: [place] Nurse may visit PRN to address patient s wound care needs. FACE TO FACE ENCOUNTER: MEDICARE and MEDICAID PATIENTS: I certify that this patient is under my care and that I had a face-to-face encounter that meets the physician face-to-face encounter requirements with this patient on this date. The encounter with the patient was in whole or in part for the following MEDICAL CONDITION: (primary reason for [place]) MEDICAL NECESSITY: I certify, that based on my findings, NURSING services are a medically necessary home health service. HOME BOUND STATUS: I certify that my clinical findings support that this patient is homebound (i.e., Due to SHANDA, CADOTTE. ([id number]) illness or injury, pt requires  aid of supportive devices such as crutches, cane, wheelchairs, walkers, the use of special transportation or the assistance of another person to leave their place of residence. There is a normal inability to leave the home and doing so requires considerable and taxing effort. Other absences are for medical reasons / religious services and are infrequent or of short duration when for other reasons). If current dressing causes regression in wound condition, may D/C ordered dressing product/s and apply Normal Saline Moist Dressing daily until next [place] / Other MD appointment. [employer] of  regression in wound condition at [phone]. Please direct any NON-WOUND related issues/requests for orders to patient's Primary Care Physician Wound #6 Left,Circumferential Toe Fourth: [place] Nurse may visit PRN to address patient s wound care needs. FACE TO FACE ENCOUNTER: MEDICARE and MEDICAID PATIENTS: I certify that this patient is under my care and that I had a face-to-face encounter that meets the physician face-to-face encounter requirements with this patient on this date. The encounter with the patient was in whole or in part for the following MEDICAL CONDITION: (primary reason for [place]) MEDICAL NECESSITY: I certify, that based on my findings, NURSING services are a medically necessary home health service. HOME BOUND STATUS: I certify that my clinical findings support that this patient is homebound (i.e., Due to illness or injury, pt requires aid of supportive devices such as crutches, cane, wheelchairs, walkers, the use of special transportation or the assistance of another person to leave their place of residence. There is a normal inability to leave the home and doing so requires considerable and taxing effort. Other absences are for medical reasons / religious services and are infrequent or of short duration when for other reasons). If current dressing causes regression in wound condition, may D/C ordered dressing product/s and apply Normal Saline Moist Dressing daily until next [place] / Other MD appointment. [place] of regression in wound condition at [phone]. Please direct any NON-WOUND related issues/requests for orders to patient's Primary Care Physician Wound #7 Right Gluteus: [place] Nurse may visit PRN to address patient s wound care needs. FACE TO FACE ENCOUNTER: MEDICARE and MEDICAID PATIENTS: I certify that this patient is under my care and that I had a  face-to-face encounter that meets the physician face-to-face encounter requirements with this patient on this date. The encounter with the patient was in whole or in part for the following MEDICAL CONDITION: (primary reason for [place]) MEDICAL NECESSITY: I certify, that based on my findings, NURSING services are a medically necessary home health service. HOME BOUND STATUS: I certify that my clinical findings support that this patient is homebound (i.e., Due to illness or injury, pt requires aid of supportive devices such as crutches, cane, wheelchairs, walkers, the use of special transportation or the assistance of another person to leave their place of residence. There is a normal inability to leave the home and doing so requires considerable and taxing effort. Other absences are for medical reasons / religious services and are infrequent or of short duration when for other reasons). If current dressing causes regression in wound condition, may D/C ordered dressing product/s and apply Normal Saline Moist Dressing daily until next [place] / Other MD appointment. [place] of regression in wound condition at [phone]. Please direct any NON-WOUND related issues/requests for orders to patient's Primary Care Physician Wound #8 Left,Lateral Foot: Continue Home  Health Visits [place] Nurse may visit PRN to address patient s wound care needs. FACE TO FACE ENCOUNTER: MEDICARE and MEDICAID PATIENTS: I certify that this patient is under my care and that I had a face-to-face encounter that meets the physician face-to-face encounter requirements with this patient on this date. The encounter with the patient was in whole or in part for the following MEDICAL CONDITION: (primary reason for [place]) MEDICAL NECESSITY: I certify, that based on my findings, NURSING services are a medically necessary home health service. HOME BOUND STATUS: I certify that my  clinical findings support that this patient is homebound (i.e., Due to illness or injury, pt requires aid of supportive devices such as crutches, cane, wheelchairs, walkers, the use of special transportation or the assistance of another person to leave their place of residence. There is a normal inability to leave the home and doing so requires considerable and taxing effort. Other absences are for medical reasons / religious services and are infrequent or of short duration when for other reasons). If current dressing causes regression in wound condition, may D/C ordered dressing product/s and apply Normal Saline Moist Dressing daily until next [place] / Other MD appointment. [place] of regression in wound condition at [phone]. Please direct any NON-WOUND related issues/requests for orders to patient's Primary Care Physician Wound #9 Left Toe Third: [place] Nurse may visit PRN to address patient s wound care needs. FACE TO FACE ENCOUNTER: MEDICARE and MEDICAID PATIENTS: I certify that this patient is under my care and that I Mroczka, Nichols. ([id number]) had a face-to-face encounter that meets the physician face-to-face encounter requirements with this patient on this date. The encounter with the patient was in whole or in part for the following MEDICAL CONDITION: (primary reason for [place]) MEDICAL NECESSITY: I certify, that based on my findings, NURSING services are a medically necessary home health service. HOME BOUND STATUS: I certify that my clinical findings support that this patient is homebound (i.e., Due to illness or injury, pt requires aid of supportive devices such as crutches, cane, wheelchairs, walkers, the use of special transportation or the assistance of another person to leave their place of residence. There is a normal inability to leave the home and doing so requires considerable and taxing effort.  Other absences are for medical reasons / religious services and are infrequent or of short duration when for other reasons). If current dressing causes regression in wound condition, may D/C ordered dressing product/s and apply Normal Saline Moist Dressing daily until next [place] / Other MD appointment. [place] of regression in wound condition at [phone]. Please direct any NON-WOUND related issues/requests for orders to patient's Primary Care Physician The following medication(s) was prescribed: lidocaine topical 4 % cream 1 1 cream topical was prescribed at facility At this point I'm gonna recommend that we continue with the Current wound care measures for the next week. We will see were things stand following. Patient in agreement with plan. I will reevaluate this patients condition in 2 week(s). Please see above for specific wound care measures. If anything worsens in the interim nursing staff will contact me for additional recommendations. Electronic Signature(s) Signed: 12/30/2017 5:22:42 PM By: Worthy Keeler PA-C Entered By: Worthy Keeler on 12/30/2017 16:53:31 Gwynne, [name] ([id number]) -------------------------------------------------------------------------------- ROS/PFSH Details Patient Name: [name]. Date of Service: 12/30/2017 2:15 PM Medical Record Number: [id number] Patient Account Number: [id number]  Date of Birth/Sex: [birth year] (52 y.o. Female) Treating RN: Carolyne Fiscal, Debi Primary Care Provider: Delight Stare Other Clinician: Referring Provider: Delight Stare Treating Provider/Extender: STONE III, HOYT Weeks in Treatment: 1 Information Obtained From Patient Wound History Do you currently have one or more open woundso Yes How many open wounds do you currently haveo 4 Approximately how long have you had your woundso 1 month and other 7 yrs How have you been treating your wound(s) until nowo gauze Has your wound(s) ever  healed and then re-openedo No Have you had any lab work done in the past montho No Have you tested positive for an antibiotic resistant organism (MRSA, VRE)o No Have you tested positive for osteomyelitis (bone infection)o Yes Have you had any tests for circulation on your legso Yes Have you had other problems associated with your woundso Swelling Constitutional Symptoms (General Health) Complaints and Symptoms: Negative for: Fever; Chills Eyes Medical History: Negative for: Cataracts; Glaucoma; Optic Neuritis Ear/Nose/Mouth/Throat Medical History: Negative for: Chronic sinus problems/congestion; Middle ear problems Hematologic/Lymphatic Medical History: Positive for: Anemia Negative for: Hemophilia; Human Immunodeficiency Virus; Lymphedema; Sickle Cell Disease Respiratory Complaints and Symptoms: No Complaints or Symptoms Medical History: Negative for: Aspiration; Asthma; Chronic Obstructive Pulmonary Disease (COPD); Pneumothorax; Sleep Apnea; Tuberculosis Cardiovascular Complaints and Symptoms: No Complaints or Symptoms Medical History: KENLYNN, HOUDE. ([id number]) Positive for: Arrhythmia - a-fib; Coronary Artery Disease; Hypotension; Peripheral Arterial Disease Gastrointestinal Medical History: Negative for: Cirrhosis ; Colitis; Crohnos; Hepatitis A; Hepatitis B; Hepatitis C Endocrine Medical History: Positive for: Type II Diabetes Treated with: Insulin, Oral agents Blood sugar tested every day: Yes Tested : 4 times a day Blood sugar testing results: Bedtime: 190 Genitourinary Medical History: Negative for: End Stage Renal Disease Immunological Medical History: Negative for: Lupus Erythematosus; Raynaudos; Scleroderma Integumentary (Skin) Medical History: Negative for: History of Burn; History of pressure wounds Musculoskeletal Medical History: Positive for: Osteoarthritis Neurologic Medical History: Negative for: Dementia; Neuropathy; Quadriplegia;  Paraplegia; Seizure Disorder Oncologic Medical History: Negative for: Received Chemotherapy; Received Radiation Psychiatric Complaints and Symptoms: No Complaints or Symptoms Medical History: Negative for: Anorexia/bulimia; Confinement Anxiety Immunizations Pneumococcal Vaccine: Received Pneumococcal Vaccination: JARRAH, SEHER ([id number]) Implantable Devices Family and Social History Cancer: No; Diabetes: Yes - Mother,Father,Maternal Grandparents,Paternal Grandparents,Child; Heart Disease: Yes - Father,Mother,Maternal Grandparents,Paternal Grandparents; Hereditary Spherocytosis: Yes - Mother; Hypertension: Yes - Mother,Siblings,Paternal Grandparents,Maternal Grandparents; Kidney Disease: No; Lung Disease: No; Seizures: Yes - Maternal Grandparents,Paternal Grandparents; Stroke: No; Thyroid Problems: Yes - Siblings; Tuberculosis: No; Current every day smoker; Marital Status - Married; Alcohol Use: Rarely; Drug Use: No History; Caffeine Use: Moderate; Financial Concerns: No; Food, Clothing or Shelter Needs: No; Support System Lacking: No; Transportation Concerns: No; Advanced Directives: No; Patient does not want information on Advanced Directives; Do not resuscitate: No; Living Will: No; Medical Power of Attorney: No Physician Affirmation I have reviewed and agree with the above information. Electronic Signature(s) Signed: 12/30/2017 5:22:42 PM By: Worthy Keeler PA-C Signed: 01/02/2018 4:55:35 PM By: Alric Quan Entered By: Worthy Keeler on 12/30/2017 16:52:43 Tadros, [name] ([id number]) -------------------------------------------------------------------------------- SuperBill Details Patient Name: [name]. Date of Service: 12/30/2017 Medical Record Number: [id number] Patient Account Number: [id number] Date of Birth/Sex: 05/16/1966 (52 y.o. 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, left leg S31.819S Unspecified open wound of right buttock, sequela L97.312 Non-pressure chronic ulcer of right ankle with fat layer exposed Facility Procedures CPT4 Code: [id number] Description: 62194 - WOUND CARE VISIT-LEV 5 EST PT Modifier: Quantity: 1 Physician Procedures CPT4: Description Modifier Quantity Code [id number] - WC PHYS LEVEL 3 - EST PT 1 ICD-10 Diagnosis Description R03.014F Unspecified open wound of right buttock, sequela 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

## 2018-01-06 ENCOUNTER — Encounter: Payer: Medicare Other | Admitting: Physician Assistant

## 2018-01-06 DIAGNOSIS — E11621 Type 2 diabetes mellitus with foot ulcer: Secondary | ICD-10-CM | POA: Diagnosis not present

## 2018-01-07 NOTE — Progress Notes (Signed)
[name] ([id number]) Visit Report for 01/06/2018 Chief Complaint Document Details Patient Name: [name], [name]. Date of Service: 01/06/2018 3:15 PM Medical Record Number: [id number] Patient Account Number: [id number] Date of Birth/Sex: [birth year] (52 y.o. Female) Treating RN: Carolyne Fiscal, Debi Primary Care Provider: Delight Stare Other Clinician: Referring Provider: Delight Stare Treating Provider/Extender: Melburn Hake, Kameran Lallier Weeks in Treatment: 2 Information Obtained from: Patient Chief Complaint She is here in follow up evaluation for multiple wounds Electronic Signature(s) Signed: 01/06/2018 5:27:42 PM By: Worthy Keeler PA-C Entered By: Worthy Keeler on 01/06/2018 15:23:57 [name], [name] ([id number]) -------------------------------------------------------------------------------- HPI Details Patient Name: [name]. Date of Service: 01/06/2018 3:15 PM Medical Record Number: [id number] Patient Account Number: [id number] Date of Birth/Sex: September 01, 1966 (52 y.o. Female) Treating RN: Carolyne Fiscal, Debi Primary Care Provider: Delight Stare Other Clinician: Referring Provider: Delight Stare Treating Provider/Extender: Melburn Hake, Davion Meara Weeks in Treatment: 2 History of Present Illness HPI Description: 52 year old patient was sent to [place] from [facility] where she was seen by Dr. Sherril Cong for a left ankle ulceration and was recently hospitalized with hypotension and sepsis. She was treated with IV antibiotics and has been scheduled to see Dr. Sharol Given. She was seen by vascular surgery who recommended a femoral bypass but surgery has been delayed until her sacral wound from last year's necrotizing fasciitis has healed. Was seeing the wound care team at [facility] but wanted to change over. She is a smoker and smokes a pack of cigarettes a day The patient was recently admitted in [place] between February 2 and February 14. She had a follow-up to see vascular surgery, orthopedic  surgery and infectious disease. 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 Review of the electronic medical records reveals that Dr. 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 An MRI showed edema in the bone which was consistent with osteomyelitis and the chronic ulcer and may need surgical intervention. She also had a sacral decubitus ulcer which was treated with the wound VAC. On 01/07/2017 she was taken up by the vascular surgeon Dr. Trula Slade for an abdominal aortogram and bilateral lower extremity runoff, for a history of having bilateral femoropopliteal bypass graft as well as external iliac stenting on the right and stenting of her bypass graft. She had developed a nonhealing wound on her right ankle and there was a possibility of a femoral occlusion. the findings were noted and the impression was a surgical revascularization with a aorto bifemoral bypass graft. Both the femoropopliteal bypass grafts were patent. 01/31/2017 -- x-ray of the right hip and pelvis -- IMPRESSION:No radiographic evidence of acute osteomyelitis. Normal-appearing right hip joint space for age. No acute bony abnormality of the hip. 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 Since seeing her last week she has not had an appointment with infectious disease or the orthopedic specialist yet. 02/06/2017 -- the patient missed a couple of appointments yesterday due to the weather but other than that has apparently given up smoking for the  last 5 days. 02/13/2017 -- she has rescheduled her infectious disease  appointment and also the appointment with the orthopedic surgeon Dr. Sharol Given 03/06/2017 -- she was seen by Dr. Lucianne Lei dam of infectious disease on 03/05/2017 and he recommendedIV antibiotics but the patient did not want to have that and he has given her Augmentin and doxycycline and will reevaluate her in 2 months time. 03/13/2017 -- he saw Dr. Trula Slade regarding her vascular issues and he would like to wait to the sacral wound is completely closed before considering aortobifemoral bypass graft. He will see her back in 2 months time. She was also seen by Dr. Sharol Given of orthopedics who recommended continue wound care dressings and follow in the office in about 2 months time. he also recommended 3 view radiographs of the right ankle at follow-up. 03/28/2017 -- she did have a PICC line placed and Dr. Lucianne Lei dam has begun IV vancomycin and ceftriaxone. she is going to continue this until she sees him in approximately a month's time 04/18/2017 -- we had applied for a skin substitute for the patient's care but her copayment is going to be about $300 a piece and the patient will not be able to afford this. 05/08/17 on evaluation today patient appears to potentially have a fungal rash in the periwound region of the sacral area. This also seems to extend into the inguinal creases and factional region as well. She is tender to palpation with light rubbing over the region of the periwound and the skin in this region does have a beefy red appearance. Everything seems to point to a fungal infection at this time. She has been tolerating the dressing changes up to this point. There is no evidence of infection otherwise. 05/16/2017 -- was seen by Dr. Rhina Brackett dam of infectious disease -- he saw for the chronic wound over her right ankle [name], [name]. ([id number]) with osteomyelitis of the fibula. He did not think that she is making progress and was  not able to examine her sacral decubitus ulcer as the patient would not allow it. She was switched to Bactrim DS 2 tablets twice a day since finishing her antibiotics. Her ESR was 76 and a CRP was 2.8. 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 05/23/17 on evaluation today patient's wound appears to be doing about the same in regard to the ankle as well as the gluteal area. She apparently has issues with the one back staying in place and she states the sill seems to break up the inferior aspect. She does have home help coming out to apply the wound VAC. She has been tolerating the dressing changes without any problem although she has had some increased pain in the gluteal region due to a fall she sustained and she feels like she brews that area and she had pain actually radiating down her leg which is slowly beginning to improve. There's no evidence of infection. 05/29/17 Unfortunately on evaluation today patient's wounds both in regard to the ankle as well as in regard to her gluteal region on the right appear to be measuring the same as last week's evaluation. She also tells me that she is having soreness today in regard to the right gluteal area because she had a visitor who came to see her a couple days ago and stayed for six hours and she had to set up for that entire length of time visiting. This has called some increased discomfort. Normally she does not  set up for those long periods of time. Fortunately there's no evidence of infection and no worsening of the wounds. 06/13/2017 -- the patient has had a lot of health issues including perfuse diarrhea and generalized weakness and I have instructed her to see her PCP as soon as possible and if things get worse to go to the ER. She has not brought her wound VAC today. She continues to be on oral antibiotics 06/30/17- she is here in follow up  evaluation for right ischial and right lateral malleolus ulcer. She continues with negative pressure wound therapy to the ischial wound. She is currently on a diabetic therapy per infectious disease, although she does state that she inconsistently takes it. She was advised to take her antibiotic as prescribed 07/11/2017 -- she has been unable to use a wound VAC regularly because she loses this evening and it has not been possible for her to use it. 07/18/2017 -- she is much more comfortable and looks better without the wound VAC and at this stage I believe it will be better for her to return the machine and continue with local care. 08/07/2017 -- she is back up to 3 weeks due to a vacation and during this time her wound VAC was returned. She has been doing local dressings 08/15/17 on evaluation today patient appears to be doing about the same in regard to her ankle as well as right gluteal wound. She has been tolerating the dressing changes and tells me that it's mainly her gluteal wound that hurts the ankle is nontender to palpation. She rates this pain to be a one out of 10 at rest and with cleansing of the wound five out of 10 at least. No fevers, chills, nausea, or vomiting noted at this time. She does note that home health has told her to inquire about possibly using silver nitrate on the wound edges due to the road nature of the edges. 08/29/2017 -- -- I got a note which Dr. Lucianne Lei dam sent to Dr. 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 He had recommended definitive surgery to fix this. He recommended to continue with Bactrim Right ankle x-ray -- IMPRESSION: Findings compatible with cellulitis of the lateral malleolar region. A small focus of bony destruction involving the tip of the lateral malleolus is worrisome for osteomyelitis. 09/05/2017 -- the patient was seen by Dr. Meridee Score yesterday who thought her  wound looked good and he would continue local care with silver alginate dressing and follow-up in 4 weeks. He did not recommend any surgical intervention of the ankle until there is good arterial flow and he would await Dr. Stephens Shire opinion regarding this. She was also seen by infectious disease Dr. Lucianne Lei dam, who was concerned about her elevated potassium and took her off Bactrim. he has recommended doxycycline. 09/26/2017 -- she is back after 3 weeks as she was out of town on vacation. She has put on some weight but other than that no change in her health 10/24/2017 -- she was seen by Dr. [name] -- after a thorough review he plans a angiographically via the left femoral approach to evaluate her left subclavian artery and address any stenosis at that time. He will also perform bilateral runoff to confirm that a right femoropopliteal as well as a left femoropopliteal bypass graft remained patent. He is then considering a right axillary to femoral bypass graft for limb salvage. [name], [name] ([id number]) ==== 12/18/17-she  is here in follow-up evaluation for multiple wounds. She is s/p aortobifemoral and left fem-pop revision. She presents with multiple necrotic areas to the left toes and lateral foot. The wounds we were originally seen her for (right heel and left buttock) are improved/stable. According to her husband the wounds to the left foot are "better" than they were upon discharge from the hospital last Tuesday. He states that Dr. Trula Slade saw her on same day of discharge. She has a follow-up with Dr. Trula Slade on Monday. There are dopplerable pulses to the left dorsalis pedis, posterior tibial, peroneal. We communicated with Dr. Stephens Shire nurse regarding today's findings, photos were sent to her email and the patient will maintain her appointment on Monday. We discussed with the patient and her husband to report to [facility] in [place] for any concerns, changes, new areas of  necrosis, new areas or progressive ischemia/purple discoloration, pallor, new/increased pain. We will anticipate follow up next week 12/30/17 patient unfortunately continues to have multiple wound of her lower extremities and specifically her left toes and lateral foot. She does have regular follow-up visits with Dr. Trula Slade who performed her surgery and at this point they are just holding to wait and see what needs to be done in regard to her foot such as what's going to heal and what may need to have potential amputation of further surgery. For this reason we are just trying to maintain and see were things end up at this point. Fortunately patient is not having any significant discomfort. The right lateral malleolus appears to be doing about the same there is some granulation tissue she does have osteomyelitis and not ulcer. 01/06/18 patient presents today for fault evaluation concerning her ongoing issues with her right lateral ankle, right gluteal region, and left lower extremity. Currently regard to left lower extremity it's possible that she may require some amputation although patient states she has the appointment follow-up with Dr. Trula Slade next week. That's when she will find out what he thinks needs to be done at that point. Nonetheless in regard to the right lateral ankle this appears to be doing fairly well today. Her right gluteal region actually appears to be doing fairly well with a good wound surface any slough that was present easily cleaned off with saline and gauze. Electronic Signature(s) Signed: 01/06/2018 5:27:42 PM By: Worthy Keeler PA-C Entered By: Worthy Keeler on 01/06/2018 15:58:38 Duerksen, [name] ([id number]) -------------------------------------------------------------------------------- Physical Exam Details Patient Name: VOLA, BENEKE S. Date of Service: 01/06/2018 3:15 PM Medical Record Number: [id number] Patient Account Number: [id number] Date of Birth/Sex:  07-May-1966 (52 y.o. Female) Treating RN: Carolyne Fiscal, Debi Primary Care Provider: Delight Stare Other Clinician: Referring Provider: Delight Stare Treating Provider/Extender: STONE III, Demarrio Menges Weeks in Treatment: 2 Constitutional Well-nourished and well-hydrated in no acute distress. Respiratory normal breathing without difficulty. clear to auscultation bilaterally. Cardiovascular regular rate and rhythm with normal S1, S2. Psychiatric this patient is able to make decisions and demonstrates good insight into disease process. Alert and Oriented x 3. pleasant and cooperative. Notes Patient continues to have dry necrotic areas of the left foot at this point fortunately there's no drainage in discharge. Subsequently she does have breakdown in regard to her right lateral ankle and sacral region both of these ulcers however appear to be doing well in regard to a good granular surface and no evidence of infection. Overall I'm pleased with how these appear. No debridement was required today. Electronic Signature(s) Signed: 01/06/2018 5:27:42 PM By: Melburn Hake,  Natalynn Pedone PA-C Entered By: Worthy Keeler on 01/06/2018 15:59:49 [name], [name] ([id number]) -------------------------------------------------------------------------------- Physician Orders Details Patient Name: TAYDEM, CAVAGNARO. Date of Service: 01/06/2018 3:15 PM Medical Record Number: [id number] Patient Account Number: [id number] Date of Birth/Sex: Feb 16, 1966 (52 y.o. 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 Wound #3 Right,Lateral Malleolus o Clean wound with Normal Saline. Wound #4 Left,Lateral Calcaneus o Clean wound with Normal Saline. Wound #5 Left,Circumferential Toe Great o Clean wound with Normal Saline. Wound #6 Left,Circumferential Toe Fourth o Clean wound with Normal Saline. Wound #7 Right Gluteus o Clean wound with Normal Saline. Wound #8 Left,Lateral Foot o Clean wound with Normal Saline. Anesthetic (add to Medication List) Wound #10 Left,Circumferential Toe Fifth o Topical Lidocaine 4% cream applied to wound bed prior to debridement (In Clinic Only). Wound #3 Right,Lateral Malleolus o Topical Lidocaine 4% cream applied to wound bed prior to debridement (In Clinic Only). Wound #4 Left,Lateral Calcaneus o Topical Lidocaine 4% cream applied to wound bed prior to debridement (In Clinic Only). Wound #5 Left,Circumferential Toe Great o Topical Lidocaine 4% cream applied to wound bed prior to debridement (In Clinic Only). Wound #6 Left,Circumferential Toe Fourth Sciandra, Javia S. ([id number]) o Topical Lidocaine 4% cream applied to wound bed prior to debridement (In Clinic Only). Wound #7 Right Gluteus o Topical Lidocaine 4% cream applied to wound bed prior to debridement (In Clinic Only). Wound #8 Left,Lateral Foot o Topical Lidocaine 4% cream applied to wound bed prior to debridement (In Clinic Only). Skin Barriers/Peri-Wound Care Wound #3 Right,Lateral Malleolus o Skin Prep Wound #7 Right Gluteus o Skin Prep Primary Wound Dressing Wound #10 Left,Circumferential Toe Fifth o Other: - betadine or providone-iodine (paint on wound) before  you place on silver alginate Wound #3 Right,Lateral Malleolus o Prisma Ag - moisten with saline Wound #4 Left,Lateral Calcaneus o Other: - betadine or providone-iodine (paint on wound) Wound #5 Left,Circumferential Toe Great o Other: - betadine or providone-iodine (paint on wound) Wound #6 Left,Circumferential Toe Fourth o Other: - betadine or providone-iodine (paint on wound) before you place on silver alginate Wound #7 Right Gluteus o Prisma Ag - moisten with saline Wound #8 Left,Lateral Foot o Other: - betadine or providone-iodine (paint on wound) first Secondary Dressing Wound #10 Left,Circumferential Toe Fifth o ABD pad o Conform/Kerlix Wound #3 Right,Lateral Malleolus o Boardered Foam Dressing Wound #4 Left,Lateral Calcaneus o ABD pad o Conform/Kerlix Wound #5 Left,Circumferential Toe Great o ABD pad o Conform/Kerlix Wound #6 Left,Circumferential Toe Fourth o ABD pad o Conform/Kerlix [name], [name] ([id number]) Wound #7 Right Gluteus o Boardered Foam Dressing Wound #8 Left,Lateral Foot o ABD pad o Conform/Kerlix Dressing Change Frequency Wound #10 Left,Circumferential Toe Fifth o Change dressing every other day. o Other: - PRN Wound #3 Right,Lateral Malleolus o Change dressing every other day. o Other: - PRN Wound #4 Left,Lateral Calcaneus o Change dressing every other day. o Other: - PRN  Wound #5 Left,Circumferential Toe Great o Change dressing every other day. o Other: - PRN Wound #6 Left,Circumferential Toe Fourth o Change dressing every other day. o Other: - PRN Wound #7 Right Gluteus o Change dressing every other day. o Other: - PRN Wound #8 Left,Lateral Foot o Change dressing every other day. o Other: - PRN Follow-up Appointments o Return Appointment in 1 week. Off-Loading Wound #10 Left,Circumferential Toe Fifth o Turn and reposition every 2 hours Wound #3 Right,Lateral  Malleolus o Turn and reposition every 2 hours Wound #4 Left,Lateral Calcaneus o Turn and reposition every 2 hours Wound #5 Left,Circumferential Toe Great o Turn and reposition every 2 hours Wound #6 Left,Circumferential Toe Fourth o Turn and reposition every 2 hours Wound #7 Right Gluteus Grill, [name] ([id number]) o Turn and reposition every 2 hours Wound #8 Left,Lateral Foot o Turn and reposition every 2 hours Additional Orders / Instructions Wound #10 Left,Circumferential Toe Fifth o Increase protein intake. Wound #3 Right,Lateral Malleolus o Increase protein intake. Wound #4 Left,Lateral Calcaneus o Increase protein intake. Wound #5 Left,Circumferential Toe Great o Increase protein intake. Wound #6 Left,Circumferential Toe Fourth o Increase protein intake. Wound #7 Right Gluteus o Increase protein intake. Wound #8 Left,Lateral Foot o Increase protein intake. Home Health Wound #10 Left,Circumferential Toe [place] Nurse may visit PRN to address patientos wound care needs. o FACE TO FACE ENCOUNTER: MEDICARE and MEDICAID PATIENTS: I certify that this patient is under my care and that I had a face-to-face encounter that meets the physician face-to-face encounter requirements with this patient on this date. The encounter with the patient was in whole or in part for the following MEDICAL CONDITION: (primary reason for [place]) MEDICAL NECESSITY: I certify, that based on my findings, NURSING services are a medically necessary home health service. HOME BOUND STATUS: I certify that my clinical findings support that this patient is homebound (i.e., Due to illness or injury, pt requires aid of supportive devices such as crutches, cane, wheelchairs, walkers, the use of special transportation or the assistance of another person to leave their place of residence. There is a normal inability to leave the  home and doing so requires considerable and taxing effort. Other absences are for medical reasons / religious services and are infrequent or of short duration when for other reasons). o If current dressing causes regression in wound condition, may D/C ordered dressing product/s and apply Normal Saline Moist Dressing daily until next [place] / Other MD appointment. [place] of regression in wound condition at [phone]. o Please direct any NON-WOUND related issues/requests for orders to patient's Primary Care Physician Wound #3 [place] Nurse may visit PRN to address patientos wound care needs. o FACE TO FACE ENCOUNTER: MEDICARE and MEDICAID PATIENTS: I certify that this patient is under my care and that I had a face-to-face encounter that meets the physician face-to-face encounter requirements with this patient on this date. The encounter with the patient was in whole or in part for the following MEDICAL CONDITION: (primary reason for [place]) MEDICAL NECESSITY: I certify, that based on my findings, NURSING services are a medically necessary home health service. HOME BOUND STATUS: I certify that my clinical findings support that this patient is homebound (i.e., Due to illness or injury, pt requires aid of supportive devices such as crutches, cane, wheelchairs, walkers, the use of special transportation or the assistance  of another person to leave their place of residence. There is a normal inability to leave the home [name], [name]. ([id number]) and doing so requires considerable and taxing effort. Other absences are for medical reasons / religious services and are infrequent or of short duration when for other reasons). o If current dressing causes regression in wound condition, may D/C ordered dressing product/s and apply Normal Saline Moist Dressing daily until next [place] / Other MD appointment. [place] of regression in wound condition at [phone]. o Please direct any NON-WOUND related issues/requests for orders to patient's Primary Care Physician Wound #4 [place] Nurse may visit PRN to address patientos wound care needs. o FACE TO FACE ENCOUNTER: MEDICARE and MEDICAID PATIENTS: I certify that this patient is under my care and that I had a face-to-face encounter that meets the physician face-to-face encounter requirements with this patient on this date. The encounter with the patient was in whole or in part for the following MEDICAL CONDITION: (primary reason for [place]) MEDICAL NECESSITY: I certify, that based on my findings, NURSING services are a medically necessary home health service. HOME BOUND STATUS: I certify that my clinical findings support that this patient is homebound (i.e., Due to illness or injury, pt requires aid of supportive devices such as crutches, cane, wheelchairs, walkers, the use of special transportation or the assistance of another person to leave their place of residence. There is a normal inability to leave the home and doing so requires considerable and taxing effort. Other absences are for medical reasons / religious services and are infrequent or of short duration when for other reasons). o If current dressing causes regression in wound condition, may D/C ordered dressing product/s and apply Normal Saline Moist Dressing daily until next [place] / Other MD appointment. [place] of regression in wound condition at [phone]. o Please direct any NON-WOUND related issues/requests for orders to patient's Primary Care Physician Wound #5 Left,Circumferential Toe [place] Nurse may visit PRN to address patientos wound care needs. o FACE TO FACE  ENCOUNTER: MEDICARE and MEDICAID PATIENTS: I certify that this patient is under my care and that I had a face-to-face encounter that meets the physician face-to-face encounter requirements with this patient on this date. The encounter with the patient was in whole or in part for the following MEDICAL CONDITION: (primary reason for [place]) MEDICAL NECESSITY: I certify, that based on my findings, NURSING services are a medically necessary home health service. HOME BOUND STATUS: I certify that my clinical findings support that this patient is homebound (i.e., Due to illness or injury, pt requires aid of supportive devices such as crutches, cane, wheelchairs, walkers, the use of special transportation or the assistance of another person to leave their place of residence. There is a normal inability to leave the home and doing so requires considerable and taxing effort. Other absences are for medical reasons / religious services and are infrequent or of short duration when for other reasons). o If current dressing causes regression in wound condition, may D/C ordered dressing product/s and apply Normal Saline Moist Dressing daily until next [place] / Other MD appointment. [place] of regression in wound condition at [phone]. o Please direct any NON-WOUND related issues/requests for orders to patient's Primary Care Physician Wound #6 Left,Circumferential Toe [place] Visits   o Home Health Nurse may visit PRN to address patientos wound care needs. o FACE TO FACE ENCOUNTER: MEDICARE and MEDICAID PATIENTS: I certify that this patient is under my care and that I had a face-to-face encounter that meets the physician face-to-face encounter requirements with this patient on this date. The encounter with the patient was in whole or in part for the following MEDICAL CONDITION: (primary reason for [place]) MEDICAL NECESSITY: I  certify, that based on my findings, NURSING services are a medically necessary home health service. HOME BOUND STATUS: I certify that my clinical findings support that this patient is homebound (i.e., Due to illness or injury, pt requires aid of supportive devices such as crutches, cane, wheelchairs, walkers, the use of special transportation or the assistance of another person to leave their place of residence. There is a normal inability to leave the home and doing so requires considerable and taxing effort. Other absences are for medical reasons / religious services and are infrequent or of short duration when for other reasons). o If current dressing causes regression in wound condition, may D/C ordered dressing product/s and apply Normal Saline Moist Dressing daily until next [place] / Other MD appointment. [place] of regression in wound condition at [phone]. [name], [name] ([id number]) o Please direct any NON-WOUND related issues/requests for orders to patient's Primary Care Physician Wound #7 Right [place] Nurse may visit PRN to address patientos wound care needs. o FACE TO FACE ENCOUNTER: MEDICARE and MEDICAID PATIENTS: I certify that this patient is under my care and that I had a face-to-face encounter that meets the physician face-to-face encounter requirements with this patient on this date. The encounter with the patient was in whole or in part for the following MEDICAL CONDITION: (primary reason for [place]) MEDICAL NECESSITY: I certify, that based on my findings, NURSING services are a medically necessary home health service. HOME BOUND STATUS: I certify that my clinical findings support that this patient is homebound (i.e., Due to illness or injury, pt requires aid of supportive devices such as crutches, cane, wheelchairs, walkers, the use of special transportation or  the assistance of another person to leave their place of residence. There is a normal inability to leave the home and doing so requires considerable and taxing effort. Other absences are for medical reasons / religious services and are infrequent or of short duration when for other reasons). o If current dressing causes regression in wound condition, may D/C ordered dressing product/s and apply Normal Saline Moist Dressing daily until next [place] / Other MD appointment. [place] of regression in wound condition at [phone]. o Please direct any NON-WOUND related issues/requests for orders to patient's Primary Care Physician Wound #8 [place] Visits o Home Health Nurse may visit PRN to address patientos wound care needs. o FACE TO FACE ENCOUNTER: MEDICARE and MEDICAID PATIENTS: I certify that this patient is under my care and that I had a face-to-face encounter that meets the physician face-to-face encounter requirements with this patient on this date. The encounter with the patient was in whole or in part for the following MEDICAL CONDITION: (primary reason for [place]) MEDICAL NECESSITY: I certify, that based on my findings, NURSING services are a medically necessary home health service. HOME BOUND STATUS: I certify that my clinical findings support that this patient is homebound (i.e., Due to illness or injury, pt  requires aid of supportive devices such as crutches, cane, wheelchairs, walkers, the use of special transportation or the assistance of another person to leave their place of residence. There is a normal inability to leave the home and doing so requires considerable and taxing effort. Other absences are for medical reasons / religious services and are infrequent or of short duration when for other reasons). o If current dressing causes regression in wound condition, may D/C ordered dressing  product/s and apply Normal Saline Moist Dressing daily until next [place] / Other MD appointment. [place] of regression in wound condition at [phone]. o Please direct any NON-WOUND related issues/requests for orders to patient's Primary Care Physician Patient Medications Allergies: Documentation for allergies has not been submitted Notifications Medication Indication Start End lidocaine DOSE 1 - topical 4 % cream - 1 cream topical Electronic Signature(s) Signed: 01/06/2018 4:06:48 PM By: Alric Quan Signed: 01/06/2018 5:27:42 PM By: Worthy Keeler PA-C Entered By: Alric Quan on 01/06/2018 15:48:02 Kohler, [name] ([id number]) -------------------------------------------------------------------------------- Prescription 01/06/2018 Patient Name: [name]. Provider: Worthy Keeler PA-C Date of Birth: 10-29-1966 NPI#: [id number] Sex: F DEA#: [id number] Phone #: [phone] License #: Patient Address: [facility] [place], [place] [address] [address] [place], [place] [address] [phone] Allergies Medication Medication: Route: Strength: Form: lidocaine topical 4% cream Class: TOPICAL LOCAL ANESTHETICS Dose: Frequency / Time: Indication: 1 1 cream topical Number of Refills: Number of Units: 0 Generic Substitution: Start Date: End Date: Administered at [place]: Yes Time Administered: Time Discontinued: Note to Pharmacy: Signature(s): Date(s): Electronic Signature(s) Signed: 01/06/2018 4:06:48 PM By: Alric Quan Signed: 01/06/2018 5:27:42 PM By: Worthy Keeler PA-C Entered By: Alric Quan on 01/06/2018 15:48:04 Schmall, Shade S. ([id number]) --------------------------------------------------------------------------------  Problem List Details Patient Name: [name]. Date of  Service: 01/06/2018 3:15 PM Medical Record Number: [id number] Patient Account Number: [id number] Date of Birth/Sex: Jul 20, 1966 (53 y.o. 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 ([id number]) -------------------------------------------------------------------------------- Progress Note Details Patient Name: [name]. Date of Service: 01/06/2018 3:15 PM Medical Record Number: [id number] Patient Account Number: [id number] Date of Birth/Sex: November 09, 1966 (52 y.o. Female) Treating RN: Carolyne Fiscal, Debi Primary Care Provider: Delight Stare Other Clinician: Referring Provider: Delight Stare Treating Provider/Extender: Melburn Hake, Mory Herrman Weeks in Treatment: 2 Subjective Chief Complaint Information obtained from Patient She is here in follow up evaluation for multiple wounds History of Present Illness (HPI) 52 year old patient was sent to [place] from [facility] where she was seen by Dr. Sherril Cong for a left ankle ulceration and was recently hospitalized with hypotension and sepsis. She was treated with IV antibiotics and has been scheduled to see Dr.  Sharol Given. She was seen by vascular surgery who recommended a femoral bypass  but surgery has been delayed until her sacral wound from last year's necrotizing fasciitis has healed. Was seeing the wound care team at [facility] but wanted to change over. She is a smoker and smokes a pack of cigarettes a day The patient was recently admitted in [place] between February 2 and February 14. She had a follow-up to see vascular surgery, orthopedic surgery and infectious disease. 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 Review of the electronic medical records reveals that Dr. 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 An MRI showed edema in the bone which was consistent with osteomyelitis and the chronic ulcer and may need surgical intervention. She also had a sacral decubitus ulcer which was treated with the wound VAC. On 01/07/2017 she was taken up by the vascular surgeon Dr. Trula Slade for an abdominal aortogram and bilateral lower extremity runoff, for a history of having bilateral femoropopliteal bypass graft as well as external iliac stenting on the right and stenting of her bypass graft. She had developed a nonhealing wound on her right ankle and there was a possibility of a femoral occlusion. the findings were noted and the impression was a surgical revascularization with a aorto bifemoral bypass graft. Both the femoropopliteal bypass grafts were patent. 01/31/2017 -- x-ray of the right hip and pelvis -- IMPRESSION:No radiographic evidence of acute osteomyelitis. Normal-appearing  right hip joint space for age. No acute bony abnormality of the hip. 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 Since seeing her last week she has not had an appointment with infectious disease or the orthopedic specialist yet. 02/06/2017 -- the patient missed a couple of appointments yesterday due to the weather but other than that has apparently given up smoking for the last 5 days. 02/13/2017 -- she has rescheduled her infectious disease appointment and also the appointment with the orthopedic surgeon Dr. Sharol Given 03/06/2017 -- she was seen by Dr. Lucianne Lei dam of infectious disease on 03/05/2017 and he recommendedIV antibiotics but the patient did not want to have that and he has given her Augmentin and doxycycline and will reevaluate her in 2 months time. 03/13/2017 -- he saw Dr. Trula Slade regarding her vascular issues and he would like to wait to the sacral wound is completely closed before considering aortobifemoral bypass graft. He will see her back in 2 months time. She was also seen by Dr. Sharol Given of orthopedics who recommended continue wound care dressings and follow in the office in about 2 months time. he also recommended 3 view radiographs of the right ankle at follow-up. 03/28/2017 -- she did have a PICC line placed and Dr. Lucianne Lei dam has begun IV vancomycin and ceftriaxone. she is going to continue this until she sees him in approximately a month's time 04/18/2017 -- we had applied for a skin substitute for the patient's care but her copayment is going to be about $300 a piece and the patient will not be able to afford this. 05/08/17 on evaluation today patient appears to potentially have a fungal rash in the periwound region of the sacral area. This  DELYNDA, SEPULVEDA. ([id number]) also seems to extend into the inguinal creases and factional region as well. She is tender to palpation with light rubbing over the region of the periwound and the  skin in this region does have a beefy red appearance. Everything seems to point to a fungal infection at this time. She has been tolerating the dressing changes up to this point. There is no evidence of infection otherwise. 05/16/2017 -- was seen by Dr. 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 He did not think that she is making progress and was not able to examine her sacral decubitus ulcer as the patient would not allow it. She was switched to Bactrim DS 2 tablets twice a day since finishing her antibiotics. Her ESR was 76 and a CRP was 2.8. 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 05/23/17 on evaluation today patient's wound appears to be doing about the same in regard to the ankle as well as the gluteal area. She apparently has issues with the one back staying in place and she states the sill seems to break up the inferior aspect. She does have home help coming out to apply the wound VAC. She has been tolerating the dressing changes without any problem although she has had some increased pain in the gluteal region due to a fall she sustained and she feels like she brews that area and she had pain actually radiating down her leg which is slowly beginning to improve. There's no evidence of infection. 05/29/17 Unfortunately on evaluation today patient's wounds both in regard to the ankle as well as in regard to her gluteal region on the right appear to be measuring the same as last week's evaluation. She also tells me that she is having soreness today in regard to the right gluteal area because she had a visitor who came to see her a couple days ago and stayed for six hours and she had to set up for that entire length of time visiting. This has called some increased discomfort. Normally she does not set up for  those long periods of time. Fortunately there's no evidence of infection and no worsening of the wounds. 06/13/2017 -- the patient has had a lot of health issues including perfuse diarrhea and generalized weakness and I have instructed her to see her PCP as soon as possible and if things get worse to go to the ER. She has not brought her wound VAC today. She continues to be on oral antibiotics 06/30/17- she is here in follow up evaluation for right ischial and right lateral malleolus ulcer. She continues with negative pressure wound therapy to the ischial wound. She is currently on a diabetic therapy per infectious disease, although she does state that she inconsistently takes it. She was advised to take her antibiotic as prescribed 07/11/2017 -- she has been unable to use a wound VAC regularly because she loses this evening and it has not been possible for her to use it. 07/18/2017 -- she is much more comfortable and looks better without the wound VAC and at this stage I believe it will be better for her to return the machine and continue with local care. 08/07/2017 -- she is back up to 3 weeks due to a vacation and during this time her wound VAC was returned. She  has been doing local dressings 08/15/17 on evaluation today patient appears to be doing about the same in regard to her ankle as well as right gluteal wound. She has been tolerating the dressing changes and tells me that it's mainly her gluteal wound that hurts the ankle is nontender to palpation. She rates this pain to be a one out of 10 at rest and with cleansing of the wound five out of 10 at least. No fevers, chills, nausea, or vomiting noted at this time. She does note that home health has told her to inquire about possibly using silver nitrate on the wound edges due to the road nature of the edges. 08/29/2017 -- -- I got a note which Dr. Lucianne Lei dam sent to Dr. Sharol Given regarding plain film showing osteomyelitis in the lateral malleolus  consistent with osteomyelitis which was worrisome. He had recommended definitive surgery to fix this. He recommended to continue with Bactrim Right ankle x-ray -- IMPRESSION: Findings compatible with cellulitis of the lateral malleolar region. A small focus of bony destruction involving the tip of the lateral malleolus is worrisome for osteomyelitis. 09/05/2017 -- the patient was seen by Dr. Meridee Score yesterday who thought her wound looked good and he would continue local care with silver alginate dressing and follow-up in 4 weeks. He did not recommend any surgical intervention of the ankle until there is good arterial flow and he would await Dr. Stephens Shire opinion regarding this. She was also seen by infectious disease Dr. Lucianne Lei dam, who was concerned about her elevated potassium and took her off Bactrim. he has recommended doxycycline. 09/26/2017 -- she is back after 3 weeks as she was out of town on vacation. She has put on some weight but other than that NICEY, KRAH. ([id number]) no change in her health 10/24/2017 -- she was seen by Dr. [name] -- after a thorough review he plans a angiographically via the left femoral approach to evaluate her left subclavian artery and address any stenosis at that time. He will also perform bilateral runoff to confirm that a right femoropopliteal as well as a left femoropopliteal bypass graft remained patent. He is then considering a right axillary to femoral bypass graft for limb salvage. ==== 12/18/17-she is here in follow-up evaluation for multiple wounds. She is s/p aortobifemoral and left fem-pop revision. She presents with multiple necrotic areas to the left toes and lateral foot. The wounds we were originally seen her for (right heel and left buttock) are improved/stable. According to her husband the wounds to the left foot are "better" than they were upon discharge from the hospital last Tuesday. He states that Dr. Trula Slade saw her on same day  of discharge. She has a follow-up with Dr. Trula Slade on Monday. There are dopplerable pulses to the left dorsalis pedis, posterior tibial, peroneal. We communicated with Dr. Stephens Shire nurse regarding today's findings, photos were sent to her email and the patient will maintain her appointment on Monday. We discussed with the patient and her husband to report to [facility] in [place] for any concerns, changes, new areas of necrosis, new areas or progressive ischemia/purple discoloration, pallor, new/increased pain. We will anticipate follow up next week 12/30/17 patient unfortunately continues to have multiple wound of her lower extremities and specifically her left toes and lateral foot. She does have regular follow-up visits with Dr. Trula Slade who performed her surgery and at this point they are just holding to wait and see what needs to be done in regard to her  foot such as what's going to heal and what may need to have potential amputation of further surgery. For this reason we are just trying to maintain and see were things end up at this point. Fortunately patient is not having any significant discomfort. The right lateral malleolus appears to be doing about the same there is some granulation tissue she does have osteomyelitis and not ulcer. 01/06/18 patient presents today for fault evaluation concerning her ongoing issues with her right lateral ankle, right gluteal region, and left lower extremity. Currently regard to left lower extremity it's possible that she may require some amputation although patient states she has the appointment follow-up with Dr. Trula Slade next week. That's when she will find out what he thinks needs to be done at that point. Nonetheless in regard to the right lateral ankle this appears to be doing fairly well today. Her right gluteal region actually appears to be doing fairly well with a good wound surface any slough that was present easily cleaned off with saline and  gauze. Patient History Information obtained from Patient. Family History Diabetes - Mother,Father,Maternal Grandparents,Paternal Grandparents,Child, Heart Disease - [place] Grandparents, Hereditary Spherocytosis - Mother, Hypertension - Mother,Siblings,Paternal Grandparents,Maternal Grandparents, Seizures - Maternal Grandparents,Paternal Grandparents, Thyroid Problems - Siblings, No family history of Cancer, Kidney Disease, Lung Disease, Stroke, Tuberculosis. Social History Current every day smoker, Marital Status - Married, Alcohol Use - Rarely, Drug Use - No History, Caffeine Use - Moderate. Review of Systems (ROS) Constitutional Symptoms (General Health) Denies complaints or symptoms of Fever, Chills. Respiratory The patient has no complaints or symptoms. Cardiovascular The patient has no complaints or symptoms. Psychiatric The patient has no complaints or symptoms. VERLEAN, [place] ([id number]) Objective Constitutional Well-nourished and well-hydrated in no acute distress. Vitals Time Taken: 3:11 PM, Height: 67 in, Temperature: 97.8 F, Pulse: 86 bpm, Respiratory Rate: 18 breaths/min, Blood Pressure: 96/61 mmHg. Respiratory normal breathing without difficulty. clear to auscultation bilaterally. Cardiovascular regular rate and rhythm with normal S1, S2. Psychiatric this patient is able to make decisions and demonstrates good insight into disease process. Alert and Oriented x 3. pleasant and cooperative. General Notes: Patient continues to have dry necrotic areas of the left foot at this point fortunately there's no drainage in discharge. Subsequently she does have breakdown in regard to her right lateral ankle and sacral region both of these ulcers however appear to be doing well in regard to a good granular surface and no evidence of infection. Overall I'm pleased with how these appear. No debridement was required today. Integumentary  (Hair, Skin) Wound #10 status is Open. Original cause of wound was Gradually Appeared. The wound is located on the Left,Circumferential Toe Fifth. The wound measures 3cm length x 4cm width x 0.1cm depth; 9.425cm^2 area and 0.942cm^3 volume. There is no tunneling or undermining noted. There is a medium amount of serosanguineous drainage noted. The wound margin is distinct with the outline attached to the wound base. There is no granulation within the wound bed. There is a large (67-100%) amount of necrotic tissue within the wound bed including Eschar. Periwound temperature was noted as No Abnormality. The periwound has tenderness on palpation. Wound #3 status is Open. Original cause of wound was Pressure Injury. The wound is located on the Right,Lateral Malleolus. The wound measures 0.6cm length x 0.8cm width x 0.2cm depth; 0.377cm^2 area and 0.075cm^3 volume. There is no tunneling or undermining noted. There is a large amount of serous drainage noted. The wound margin is distinct with the  outline attached to the wound base. There is large (67-100%) pink granulation within the wound bed. There is a small (1-33%) amount of necrotic tissue within the wound bed including Adherent Slough. The periwound skin appearance exhibited: Maceration. Periwound temperature was noted as No Abnormality. Wound #4 status is Open. Original cause of wound was Blister. The wound is located on the Left,Lateral Calcaneus. The wound measures 1.4cm length x 1.4cm width x 0.1cm depth; 1.539cm^2 area and 0.154cm^3 volume. There is no tunneling or undermining noted. There is a none present amount of drainage noted. The wound margin is distinct with the outline attached to the wound base. There is small (1-33%) pink granulation within the wound bed. There is a large (67-100%) amount of necrotic tissue within the wound bed including Eschar and Adherent Slough. The periwound skin appearance exhibited: Maceration. Periwound  temperature was noted as No Abnormality. The periwound has tenderness on palpation. Wound #5 status is Open. Original cause of wound was Gradually Appeared. The wound is located on the Left,Circumferential Toe Great. The wound measures 5cm length x 4.5cm width x 0.1cm depth; 17.671cm^2 area and 1.767cm^3 volume. There is no tunneling or undermining noted. There is a none present amount of drainage noted. The wound margin is distinct with the outline attached to the wound base. There is no granulation within the wound bed. There is a large (67-100%) amount of necrotic tissue within the wound bed including Eschar. Periwound temperature was noted as No Abnormality. Wound #6 status is Open. Original cause of wound was Gradually Appeared. The wound is located on the Left,Circumferential Toe Fourth. The wound measures 1.5cm length x 6cm width x 0.1cm depth; 7.069cm^2 area and Boxley, Donalee S. ([id number]) 0.707cm^3 volume. There is no tunneling or undermining noted. There is a large amount of serosanguineous drainage noted. The wound margin is distinct with the outline attached to the wound base. There is no granulation within the wound bed. There is a large (67-100%) amount of necrotic tissue within the wound bed including Eschar and Adherent Slough. Periwound temperature was noted as No Abnormality. Wound #7 status is Open. Original cause of wound was Gradually Appeared. The wound is located on the Right Gluteus. The wound measures 1.6cm length x 0.8cm width x 0.5cm depth; 1.005cm^2 area and 0.503cm^3 volume. There is no tunneling or undermining noted. There is a large amount of serosanguineous drainage noted. The wound margin is distinct with the outline attached to the wound base. There is large (67-100%) red granulation within the wound bed. There is a small (1-33%) amount of necrotic tissue within the wound bed including Adherent Slough. The periwound skin appearance exhibited: Scarring. The  periwound skin appearance did not exhibit: Callus, Crepitus, Excoriation, Induration, Rash, Dry/Scaly, Maceration, Atrophie Blanche, Cyanosis, Ecchymosis, Hemosiderin Staining, Mottled, Pallor, Rubor, Erythema. Periwound temperature was noted as No Abnormality. The periwound has tenderness on palpation. Wound #8 status is Open. Original cause of wound was Gradually Appeared. The wound is located on the Left,Lateral Foot. The wound measures 7.5cm length x 4cm width x 0.1cm depth; 23.562cm^2 area and 2.356cm^3 volume. There is no tunneling or undermining noted. There is a medium amount of serosanguineous drainage noted. The wound margin is distinct with the outline attached to the wound base. There is no granulation within the wound bed. There is a large (67-100%) amount of necrotic tissue within the wound bed including Eschar. The periwound skin appearance exhibited: Maceration. Periwound temperature was noted as No Abnormality. Wound #9 status is Open. Original cause of  wound was Gradually Appeared. The wound is located on the Left Toe Third. The wound measures 0cm length x 0cm width x 0cm depth; 0cm^2 area and 0cm^3 volume. There is no tunneling or undermining noted. There is a none present amount of drainage noted. The wound margin is distinct with the outline attached to the wound base. There is no granulation within the wound bed. There is a large (67-100%) amount of necrotic tissue within the wound bed including Eschar. Periwound temperature was noted as No Abnormality. 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 Wound #3 Right,Lateral Malleolus: Clean wound with Normal Saline. Wound #4 Left,Lateral Calcaneus: Clean wound with Normal Saline. Wound #5 Left,Circumferential Toe Great: Clean wound with Normal Saline. Wound #6 Left,Circumferential Toe Fourth: Lilley, Atara S. ([id number]) Clean wound with Normal Saline. Wound #7 Right Gluteus: Clean wound with Normal Saline. Wound #8 Left,Lateral Foot: Clean wound with Normal Saline. Anesthetic (add to Medication List): Wound #10 Left,Circumferential Toe Fifth: Topical Lidocaine 4% cream applied to wound bed prior to debridement (In Clinic Only). Wound #3 Right,Lateral Malleolus: Topical Lidocaine 4% cream applied to wound bed prior to debridement (In Clinic Only). Wound #4 Left,Lateral Calcaneus: Topical Lidocaine 4% cream applied to wound bed prior to debridement (In Clinic Only). Wound #5 Left,Circumferential Toe Great: Topical Lidocaine 4% cream applied to wound bed prior to debridement (In Clinic Only). Wound #6 Left,Circumferential Toe Fourth: Topical Lidocaine 4% cream applied to wound bed prior to debridement (In Clinic Only). Wound #7 Right Gluteus: Topical Lidocaine 4% cream applied to wound bed prior to debridement (In Clinic Only). Wound #8 Left,Lateral Foot: Topical Lidocaine 4% cream applied to wound bed prior to debridement (In Clinic Only). Skin Barriers/Peri-Wound Care: Wound #3 Right,Lateral Malleolus: Skin Prep Wound #7 Right Gluteus: Skin Prep Primary Wound Dressing: Wound #10 Left,Circumferential Toe Fifth: Other: - betadine or providone-iodine (paint on wound) before you place on silver alginate Wound #3 Right,Lateral Malleolus: Prisma Ag - moisten with saline Wound #4 Left,Lateral Calcaneus: Other: - betadine or providone-iodine (paint on wound) Wound #5 Left,Circumferential Toe Great: Other: - betadine or  providone-iodine (paint on wound) Wound #6 Left,Circumferential Toe Fourth: Other: - betadine or providone-iodine (paint on wound) before you place on silver alginate Wound #7 Right Gluteus: Prisma Ag - moisten with saline Wound #8 Left,Lateral Foot: Other: - betadine or providone-iodine (paint on wound) first Secondary Dressing: Wound #10 Left,Circumferential Toe Fifth: ABD pad Conform/Kerlix Wound #3 Right,Lateral Malleolus: Boardered Foam Dressing Wound #4 Left,Lateral Calcaneus: ABD pad Conform/Kerlix Wound #5 Left,Circumferential Toe Great: ABD pad Conform/Kerlix Wound #6 Left,Circumferential Toe Fourth: ABD pad Conform/Kerlix Wound #7 Right Gluteus: Boardered Foam Dressing Wound #8 Left,Lateral Foot: ABD pad Conform/Kerlix [name], [name] ([id number]) Dressing Change Frequency: Wound #10 Left,Circumferential Toe Fifth: Change dressing every other day. Other: - PRN Wound #3 Right,Lateral Malleolus: Change dressing every other day. Other: - PRN Wound #4 Left,Lateral Calcaneus: Change dressing every other day. Other: - PRN Wound #5 Left,Circumferential Toe Great: Change dressing every other day. Other: - PRN Wound #6 Left,Circumferential Toe Fourth: Change dressing every other day. Other: - PRN Wound #7 Right Gluteus:  Change dressing every other day. Other: - PRN Wound #8 Left,Lateral Foot: Change dressing every other day. Other: - PRN Follow-up Appointments: Return Appointment in 1 week. Off-Loading: Wound #10 Left,Circumferential Toe Fifth: Turn and reposition every 2 hours Wound #3 Right,Lateral Malleolus: Turn and reposition every 2 hours Wound #4 Left,Lateral Calcaneus: Turn and reposition every 2 hours Wound #5 Left,Circumferential Toe Great: Turn and reposition every 2 hours Wound #6 Left,Circumferential Toe Fourth: Turn and reposition every 2 hours Wound #7 Right Gluteus: Turn and reposition every 2 hours Wound #8 Left,Lateral Foot: Turn  and reposition every 2 hours Additional Orders / Instructions: Wound #10 Left,Circumferential Toe Fifth: Increase protein intake. Wound #3 Right,Lateral Malleolus: Increase protein intake. Wound #4 Left,Lateral Calcaneus: Increase protein intake. Wound #5 Left,Circumferential Toe Great: Increase protein intake. Wound #6 Left,Circumferential Toe Fourth: Increase protein intake. Wound #7 Right Gluteus: Increase protein intake. Wound #8 Left,Lateral Foot: Increase protein intake. Home Health: Wound #10 Left,Circumferential Toe Fifth: [place] Nurse may visit PRN to address patient s wound care needs. FACE TO FACE ENCOUNTER: MEDICARE and MEDICAID PATIENTS: I certify that this patient is under my care and that I had a face-to-face encounter that meets the physician face-to-face encounter requirements with this patient on this date. The PLEASANT, BRITZ ([id number]) encounter with the patient was in whole or in part for the following MEDICAL CONDITION: (primary reason for Home Healthcare) MEDICAL NECESSITY: I certify, that based on my findings, NURSING services are a medically necessary home health service. HOME BOUND STATUS: I certify that my clinical findings support that this patient is homebound (i.e., Due to illness or injury, pt requires aid of supportive devices such as crutches, cane, wheelchairs, walkers, the use of special transportation or the assistance of another person to leave their place of residence. There is a normal inability to leave the home and doing so requires considerable and taxing effort. Other absences are for medical reasons / religious services and are infrequent or of short duration when for other reasons). If current dressing causes regression in wound condition, may D/C ordered dressing product/s and apply Normal Saline Moist Dressing daily until next [place] / Other MD appointment. [place] of  regression in wound condition at [phone]. Please direct any NON-WOUND related issues/requests for orders to patient's Primary Care Physician Wound #3 Right,Lateral Malleolus: [place] Nurse may visit PRN to address patient s wound care needs. FACE TO FACE ENCOUNTER: MEDICARE and MEDICAID PATIENTS: I certify that this patient is under my care and that I had a face-to-face encounter that meets the physician face-to-face encounter requirements with this patient on this date. The encounter with the patient was in whole or in part for the following MEDICAL CONDITION: (primary reason for [place]) MEDICAL NECESSITY: I certify, that based on my findings, NURSING services are a medically necessary home health service. HOME BOUND STATUS: I certify that my clinical findings support that this patient is homebound (i.e., Due to illness or injury, pt requires aid of supportive devices such as crutches, cane, wheelchairs, walkers, the use of special transportation or the assistance of another person to leave their place of residence. There is a normal inability to leave the home and doing so requires considerable and taxing effort. Other absences are for medical reasons / religious services and are infrequent or of short duration when for other reasons). If current dressing causes regression in wound condition, may D/C ordered dressing product/s  and apply Normal Saline Moist Dressing daily until next [place] / Other MD appointment. [place] of regression in wound condition at [phone]. Please direct any NON-WOUND related issues/requests for orders to patient's Primary Care Physician Wound #4 Left,Lateral Calcaneus: [place] Nurse may visit PRN to address patient s wound care needs. FACE TO FACE ENCOUNTER: MEDICARE and MEDICAID PATIENTS: I certify that this patient is under my care and that I had a  face-to-face encounter that meets the physician face-to-face encounter requirements with this patient on this date. The encounter with the patient was in whole or in part for the following MEDICAL CONDITION: (primary reason for [place]) MEDICAL NECESSITY: I certify, that based on my findings, NURSING services are a medically necessary home health service. HOME BOUND STATUS: I certify that my clinical findings support that this patient is homebound (i.e., Due to illness or injury, pt requires aid of supportive devices such as crutches, cane, wheelchairs, walkers, the use of special transportation or the assistance of another person to leave their place of residence. There is a normal inability to leave the home and doing so requires considerable and taxing effort. Other absences are for medical reasons / religious services and are infrequent or of short duration when for other reasons). If current dressing causes regression in wound condition, may D/C ordered dressing product/s and apply Normal Saline Moist Dressing daily until next [place] / Other MD appointment. [place] of regression in wound condition at [phone]. Please direct any NON-WOUND related issues/requests for orders to patient's Primary Care Physician Wound #5 Left,Circumferential Toe Great: [place] Nurse may visit PRN to address patient s wound care needs. FACE TO FACE ENCOUNTER: MEDICARE and MEDICAID PATIENTS: I certify that this patient is under my care and that I had a face-to-face encounter that meets the physician face-to-face encounter requirements with this patient on this date. The encounter with the patient was in whole or in part for the following MEDICAL CONDITION: (primary reason for [place]) MEDICAL NECESSITY: I certify, that based on my findings, NURSING services are a medically necessary home health service. HOME BOUND STATUS: I certify  that my clinical findings support that this patient is homebound (i.e., Due to illness or injury, pt requires aid of supportive devices such as crutches, cane, wheelchairs, walkers, the use of special transportation or the assistance of another person to leave their place of residence. There is a normal inability to leave the home and doing so requires considerable and taxing effort. Other absences are for medical reasons / religious services and are infrequent or of short duration when for other reasons). If current dressing causes regression in wound condition, may D/C ordered dressing product/s and apply Normal Saline Moist Dressing daily until next [place] / Other MD appointment. [place] of regression in wound condition at [phone]. Please direct any NON-WOUND related issues/requests for orders to patient's Primary Care Physician Wound #6 Left,Circumferential Toe Fourth: BATSHEVA, STEVICK ([id number]) [place] Nurse may visit PRN to address patient s wound care needs. FACE TO FACE ENCOUNTER: MEDICARE and MEDICAID PATIENTS: I certify that this patient is under my care and that I had a face-to-face encounter that meets the physician face-to-face encounter requirements with this patient on this date. The encounter with the patient was in whole or in part for the following MEDICAL CONDITION: (primary reason for Home  Healthcare) MEDICAL NECESSITY: I certify, that based on my findings, NURSING services are a medically necessary home health service. HOME BOUND STATUS: I certify that my clinical findings support that this patient is homebound (i.e., Due to illness or injury, pt requires aid of supportive devices such as crutches, cane, wheelchairs, walkers, the use of special transportation or the assistance of another person to leave their place of residence. There is a normal inability to leave the home and doing so requires  considerable and taxing effort. Other absences are for medical reasons / religious services and are infrequent or of short duration when for other reasons). If current dressing causes regression in wound condition, may D/C ordered dressing product/s and apply Normal Saline Moist Dressing daily until next [place]  / Other MD appointment. [place] of regression in wound condition at [phone]. Please direct any NON-WOUND related issues/requests for orders to patient's Primary Care Physician Wound #7 Right Gluteus: [place] Nurse may visit PRN to address patient s wound care needs. FACE TO FACE ENCOUNTER: MEDICARE and MEDICAID PATIENTS: I certify that this patient is under my care and that I had a face-to-face encounter that meets the physician face-to-face encounter requirements with this patient on this date. The encounter with the patient was in whole or in part for the following MEDICAL CONDITION: (primary reason for [place]) MEDICAL NECESSITY: I certify, that based on my findings, NURSING services are a medically necessary home health service. HOME BOUND STATUS: I certify that my clinical findings support that this patient is homebound (i.e., Due to illness or injury, pt requires aid of supportive devices such as crutches, cane, wheelchairs, walkers, the use of special transportation or the assistance of another person to leave their place of residence. There is a normal inability to leave the home and doing so requires considerable and taxing effort. Other absences are for medical reasons / religious services and are infrequent or of short duration when for other reasons). If current dressing causes regression in wound condition, may D/C ordered dressing product/s and apply Normal Saline Moist Dressing daily until next [place] / Other MD appointment. [place] of regression in wound  condition at [phone]. Please direct any NON-WOUND related issues/requests for orders to patient's Primary Care Physician Wound #8 Left,Lateral Foot: [place] Nurse may visit PRN to address patient s wound care needs. FACE TO FACE ENCOUNTER: MEDICARE and MEDICAID PATIENTS: I certify that this patient is under my care and that I had a face-to-face encounter that meets the physician face-to-face encounter requirements with this patient on this date. The encounter with the patient was in whole or in part for the following MEDICAL CONDITION: (primary reason for [place]) MEDICAL NECESSITY: I certify, that based on my findings, NURSING services are a medically necessary home health service. HOME BOUND STATUS: I certify that my clinical findings support that this patient is homebound (i.e., Due to illness or injury, pt requires aid of supportive devices such as crutches, cane, wheelchairs, walkers, the use of special transportation or the assistance of another person to leave their place of residence. There is a normal inability to leave the home and doing so requires considerable and taxing effort. Other absences are for medical reasons / religious services and are infrequent or of short duration when for other reasons). If current dressing causes regression in wound condition, may D/C ordered dressing product/s and apply Normal Saline Moist  Dressing daily until next [place] / Other MD appointment. [place] of regression in wound condition at [phone]. Please direct any NON-WOUND related issues/requests for orders to patient's Primary Care Physician The following medication(s) was prescribed: lidocaine topical 4 % cream 1 1 cream topical was prescribed at facility I'm going to suggest at this point that we continue with the current wound care measures cannot regard that she seems to be doing well. Patient is in agreement  with plan. We'll see were things stand in one weeks time we see her for reevaluation. SABRIEL, BORROMEO ([id number]) Please see above for specific wound care orders. We will see patient for re-evaluation in 1 week(s) here in the clinic. If anything worsens or changes patient will contact our office for additional recommendations. Electronic Signature(s) Signed: 01/06/2018 5:27:42 PM By: Worthy Keeler PA-C Entered By: Worthy Keeler on 01/06/2018 16:00:28 Masters, [name] ([id number]) -------------------------------------------------------------------------------- ROS/PFSH Details Patient Name: [name]. Date of Service: 01/06/2018 3:15 PM Medical Record Number: [id number] Patient Account Number: [id number] Date of Birth/Sex: 1966/03/24 (52 y.o. Female) Treating RN: Carolyne Fiscal, Debi Primary Care Provider: Delight Stare Other Clinician: Referring Provider: Delight Stare Treating Provider/Extender: STONE III, Demaya Hardge Weeks in Treatment: 2 Information Obtained From Patient Wound History Do you currently have one or more open woundso Yes How many open wounds do you currently haveo 4 Approximately how long have you had your woundso 1 month and other 7 yrs How have you been treating your wound(s) until nowo gauze Has your wound(s) ever healed and then re-openedo No Have you had any lab work done in the past montho No Have you tested positive for an antibiotic resistant organism (MRSA, VRE)o No Have you tested positive for osteomyelitis (bone infection)o Yes Have you had any tests for circulation on your legso Yes Have you had other problems associated with your woundso Swelling Constitutional Symptoms (General Health) Complaints and Symptoms: Negative for: Fever; Chills Eyes Medical History: Negative for: Cataracts; Glaucoma; Optic Neuritis Ear/Nose/Mouth/Throat Medical History: Negative for: Chronic sinus problems/congestion; Middle ear problems Hematologic/Lymphatic Medical  History: Positive for: Anemia Negative for: Hemophilia; Human Immunodeficiency Virus; Lymphedema; Sickle Cell Disease Respiratory Complaints and Symptoms: No Complaints or Symptoms Medical History: Negative for: Aspiration; Asthma; Chronic Obstructive Pulmonary Disease (COPD); Pneumothorax; Sleep Apnea; Tuberculosis Cardiovascular Complaints and Symptoms: No Complaints or Symptoms Medical History: SHAKELA, DONATI. ([id number]) Positive for: Arrhythmia - a-fib; Coronary Artery Disease; Hypotension; Peripheral Arterial Disease Gastrointestinal Medical History: Negative for: Cirrhosis ; Colitis; Crohnos; Hepatitis A; Hepatitis B; Hepatitis C Endocrine Medical History: Positive for: Type II Diabetes Treated with: Insulin, Oral agents Blood sugar tested every day: Yes Tested : 4 times a day Blood sugar testing results: Bedtime: 190 Genitourinary Medical History: Negative for: End Stage Renal Disease Immunological Medical History: Negative for: Lupus Erythematosus; Raynaudos; Scleroderma Integumentary (Skin) Medical History: Negative for: History of Burn; History of pressure wounds Musculoskeletal Medical History: Positive for: Osteoarthritis Neurologic Medical History: Negative for: Dementia; Neuropathy; Quadriplegia; Paraplegia; Seizure Disorder Oncologic Medical History: Negative for: Received Chemotherapy; Received Radiation Psychiatric Complaints and Symptoms: No Complaints or Symptoms Medical History: Negative for: Anorexia/bulimia; Confinement Anxiety Immunizations Pneumococcal Vaccine: Received Pneumococcal Vaccination: CARLINDA, OHLSON ([id number]) Implantable Devices Family and Social History Cancer: No; Diabetes: Yes - Mother,Father,Maternal Grandparents,Paternal Grandparents,Child; Heart Disease: Yes - Father,Mother,Maternal Grandparents,Paternal Grandparents; Hereditary Spherocytosis: Yes - Mother; Hypertension: Yes - Mother,Siblings,Paternal  Grandparents,Maternal Grandparents; Kidney Disease: No; Lung Disease: No; Seizures: Yes - Maternal Grandparents,Paternal Grandparents; Stroke: No; Thyroid Problems:  Yes - Siblings; Tuberculosis: No; Current every day smoker; Marital Status - Married; Alcohol Use: Rarely; Drug Use: No History; Caffeine Use: Moderate; Financial Concerns: No; Food, Clothing or Shelter Needs: No; Support System Lacking: No; Transportation Concerns: No; Advanced Directives: No; Patient does not want information on Advanced Directives; Do not resuscitate: No; Living Will: No; Medical Power of Attorney: No Physician Affirmation I have reviewed and agree with the above information. Electronic Signature(s) Signed: 01/06/2018 4:06:48 PM By: Alric Quan Signed: 01/06/2018 5:27:42 PM By: Worthy Keeler PA-C Entered By: Worthy Keeler on 01/06/2018 15:59:14 Chilson, [name] ([id number]) -------------------------------------------------------------------------------- SuperBill Details Patient Name: KERRILYN, AZBILL. Date of Service: 01/06/2018 Medical Record Number: [id number] Patient Account Number: [id number] Date of Birth/Sex: 10-14-1966 (52 y.o. 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, left leg S31.819S Unspecified open wound of right buttock, sequela L97.312 Non-pressure chronic ulcer of right ankle with fat layer exposed Facility Procedures CPT4 Code: 85488301 Description: 41597 - WOUND CARE VISIT-LEV 5 EST PT Modifier: Quantity: 1 Physician Procedures CPT4: Description Modifier  Quantity Code [id number] - WC PHYS LEVEL 3 - EST PT 1 ICD-10 Diagnosis Description S31.819S Unspecified open wound of right buttock, sequela I70.393 Other atherosclerosis of unspecified type of bypass graft(s) of the  extremities, bilateral legs 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

## 2018-01-08 NOTE — Progress Notes (Signed)
[name] ([id number]) Visit Report for 01/06/2018 Arrival Information Details Patient Name: [name], [name]. Date of Service: 01/06/2018 3:15 PM Medical Record Number: [id number] Patient Account Number: [id number] Date of Birth/Sex: 01/06/1966 (52 y.o. Female) Treating RN: Montey Hora Primary Care Sir Mallis: Delight Stare Other Clinician: Referring Leonilda Cozby: Delight Stare Treating Noretta Frier/Extender: Melburn Hake, HOYT Weeks in Treatment: 2 Visit Information History Since Last Visit Added or deleted any medications: No Patient Arrived: Walker Any new allergies or adverse reactions: No Arrival Time: 15:10 Had a fall or experienced change in No Accompanied By: self activities of daily living that may affect Transfer Assistance: None risk of falls: Patient Identification Verified: Yes Signs or symptoms of abuse/neglect since last visito No Secondary Verification Process Yes Hospitalized since last visit: No Completed: Has Dressing in Place as Prescribed: Yes Patient Requires Transmission-Based No Pain Present Now: No Precautions: Patient Has Alerts: Yes Patient Alerts: Patient on Blood Thinner DM II Xarelto Electronic Signature(s) Signed: 01/06/2018 4:30:06 PM By: Montey Hora Entered By: Montey Hora on 01/06/2018 15:10:56 [name] ([id number]) -------------------------------------------------------------------------------- Clinic Level of Care Assessment Details Patient Name: [name]. Date of Service: 01/06/2018 3:15 PM Medical Record Number: [id number] Patient Account Number: [id number] Date of Birth/Sex: 07-27-1966 (52 y.o. Female) Treating RN: Carolyne Fiscal, Debi Primary Care Kelsha Older: Onorato, LINDA Other Clinician: Referring Kaavya Puskarich: Delight Stare Treating Kenyotta Dorfman/Extender: Melburn Hake, HOYT Weeks in Treatment: 2 Clinic Level of Care Assessment Items TOOL 4 Quantity Score X - Use when only an EandM is performed on FOLLOW-UP visit 1 0 ASSESSMENTS -  Nursing Assessment / Reassessment X - Reassessment of Co-morbidities (includes updates in patient status) 1 10 X- 1 5 Reassessment of Adherence to Treatment Plan ASSESSMENTS - Wound and Skin Assessment / Reassessment []  - Simple Wound Assessment / Reassessment - one wound 0 X- 7 5 Complex Wound Assessment / Reassessment - multiple wounds []  - 0 Dermatologic / Skin Assessment (not related to wound area) ASSESSMENTS - Focused Assessment []  - Circumferential Edema Measurements - multi extremities 0 []  - 0 Nutritional Assessment / Counseling / Intervention []  - 0 Lower Extremity Assessment (monofilament, tuning fork, pulses) []  - 0 Peripheral Arterial Disease Assessment (using hand held doppler) ASSESSMENTS - Ostomy and/or Continence Assessment and Care []  - Incontinence Assessment and Management 0 []  - 0 Ostomy Care Assessment and Management (repouching, etc.) PROCESS - Coordination of Care []  - Simple Patient / Family Education for ongoing care 0 X- 1 20 Complex (extensive) Patient / Family Education for ongoing care X- 1 10 Staff obtains Programmer, systems, Records, Test Results / Process Orders X- 1 10 Staff telephones HHA, Nursing Homes / Clarify orders / etc []  - 0 Routine Transfer to another Facility (non-emergent condition) []  - 0 Routine Hospital Admission (non-emergent condition) []  - 0 New Admissions / Biomedical engineer / Ordering NPWT, Apligraf, etc. []  - 0 Emergency Hospital Admission (emergent condition) X- 1 10 Simple Discharge Coordination [name], [name] ([id number]) []  - 0 Complex (extensive) Discharge Coordination PROCESS - Special Needs []  - Pediatric / Minor Patient Management 0 []  - 0 Isolation Patient Management []  - 0 Hearing / Language / Visual special needs []  - 0 Assessment of Community assistance (transportation, D/C planning, etc.) []  - 0 Additional assistance / Altered mentation []  - 0 Support Surface(s) Assessment (bed, cushion, seat,  etc.) INTERVENTIONS - Wound Cleansing / Measurement []  - Simple Wound Cleansing - one wound 0 X- 7 5 Complex Wound Cleansing - multiple wounds X- 1 5 Wound Imaging (photographs - any  number of wounds) []  - 0 Wound Tracing (instead of photographs) []  - 0 Simple Wound Measurement - one wound X- 7 5 Complex Wound Measurement - multiple wounds INTERVENTIONS - Wound Dressings X - Small Wound Dressing one or multiple wounds 1 10 X- 1 15 Medium Wound Dressing one or multiple wounds []  - 0 Large Wound Dressing one or multiple wounds X- 1 5 Application of Medications - topical []  - 0 Application of Medications - injection INTERVENTIONS - Miscellaneous []  - External ear exam 0 []  - 0 Specimen Collection (cultures, biopsies, blood, body fluids, etc.) []  - 0 Specimen(s) / Culture(s) sent or taken to Lab for analysis []  - 0 Patient Transfer (multiple staff / Civil Service fast streamer / Similar devices) []  - 0 Simple Staple / Suture removal (25 or less) []  - 0 Complex Staple / Suture removal (26 or more) []  - 0 Hypo / Hyperglycemic Management (close monitor of Blood Glucose) []  - 0 Ankle / Brachial Index (ABI) - do not check if billed separately X- 1 5 Vital Signs Gallop, [name] ([id number]) Has the patient been seen at the hospital within the last three years: Yes Total Score: 210 Level Of Care: New/Established - Level 5 Electronic Signature(s) Unsigned Entered By: Alric Quan on 01/07/2018 14:37:31 Signature(s): Date(s): [name] ([id number]) -------------------------------------------------------------------------------- Encounter Discharge Information Details Patient Name: [name], [name]. Date of Service: 01/06/2018 3:15 PM Medical Record Number: [id number] Patient Account Number: [id number] Date of Birth/Sex: [birth year] (52 y.o. Female) Treating RN: Carolyne Fiscal, Debi Primary Care Ogechi Kuehnel: Delight Stare Other Clinician: Referring Kieon Lawhorn: Delight Stare Treating  Krystyn Picking/Extender: Melburn Hake, HOYT Weeks in Treatment: 2 Encounter Discharge Information Items Discharge Pain Level: 0 Discharge Condition: Stable Ambulatory Status: Walker Discharge Destination: Home Transportation: Private Auto Accompanied By: husband Schedule Follow-up Appointment: Yes Medication Reconciliation completed and No provided to Patient/Care Hatcher Froning: Provided on Clinical Summary of Care: 01/06/2018 Form Type Recipient Paper Patient RM Electronic Signature(s) Signed: 01/07/2018 8:42:29 AM By: Ruthine Dose Entered By: Ruthine Dose on 01/06/2018 15:55:28 Weare, Ellicia S. ([id number]) -------------------------------------------------------------------------------- Lower Extremity Assessment Details Patient Name: [name]. Date of Service: 01/06/2018 3:15 PM Medical Record Number: [id number] Patient Account Number: [id number] Date of Birth/Sex: 05/21/1966 (52 y.o. Female) Treating RN: Montey Hora Primary Care Khaya Theissen: Delight Stare Other Clinician: Referring Sheera Illingworth: Delight Stare Treating Sadira Standard/Extender: STONE III, HOYT Weeks in Treatment: 2 Vascular Assessment Pulses: Dorsalis Pedis Palpable: [Left:Yes] [Right:Yes] Posterior Tibial Extremity colors, hair growth, and conditions: Extremity Color: [Left:Pale] [Right:Pale] Hair Growth on Extremity: [Left:No] [Right:No] Temperature of Extremity: [Left:Cool] [Right:Cool] Capillary Refill: [Left:< 3 seconds] [Right:< 3 seconds] Electronic Signature(s) Signed: 01/06/2018 3:38:47 PM By: Montey Hora Entered By: Montey Hora on 01/06/2018 15:38:47 Cercone, Zamara S. ([id number]) -------------------------------------------------------------------------------- Multi Wound Chart Details Patient Name: [name]. Date of Service: 01/06/2018 3:15 PM Medical Record Number: [id number] Patient Account Number: [id number] Date of Birth/Sex: 04-05-1966 (52 y.o. Female) Treating RN: Carolyne Fiscal, Debi Primary Care  Dalana Pfahler: Delight Stare Other Clinician: Referring Sherle Mello: Delight Stare Treating Victorio Creeden/Extender: STONE III, HOYT Weeks in Treatment: 2 Vital Signs Height(in): 31 Pulse(bpm): 51 Weight(lbs): Blood Pressure(mmHg): 96/61 Body Mass Index(BMI): Temperature(F): 97.8 Respiratory Rate 18 (breaths/min): Photos: Wound Location: Left Toe Fifth - Right Malleolus - Lateral Left Calcaneus - Lateral Circumfernential Wounding Event: Gradually Appeared Pressure Injury Blister 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 Lower Extremity Lower Extremity Comorbid History: Anemia, Arrhythmia, Coronary Anemia, Arrhythmia, Coronary Anemia, Arrhythmia, Coronary Artery Disease, Hypotension, Artery Disease, Hypotension, Artery Disease,  Hypotension, Peripheral Arterial Disease, Peripheral Arterial Disease, Peripheral Arterial Disease, Type II Diabetes, Type II Diabetes, Type II Diabetes, Osteoarthritis Osteoarthritis Osteoarthritis Date Acquired: 11/27/2017 09/23/2016 11/27/2017 Weeks of Treatment: 1 2 2  Wound Status: Open Open Open Pending Amputation on No No Yes Presentation: Measurements L x W x D 3x4x0.1 0.6x0.8x0.2 1.4x1.4x0.1 (cm) Area (cm) : 9.425 0.377 1.539 Volume (cm) : 0.942 0.075 0.154 % Reduction in Area: 0.00% 55.50% 90.50% % Reduction in Volume: 0.00% 55.90% 95.30% Classification: Grade 1 Grade 2 Grade 2 Exudate Amount: Medium Large None Present Exudate Type: Serosanguineous Serous N/A Exudate Color: red, brown amber N/A Wound Margin: Distinct, outline attached Distinct, outline attached Distinct, outline attached Granulation Amount: None Present (0%) Large (67-100%) Small (1-33%) Granulation Quality: N/A Pink Pink Necrotic Amount: Large (67-100%) Small (1-33%) Large (67-100%) Olsson, Odell S. ([id number]) Necrotic Tissue: Eschar Adherent Slough Eschar, Adherent Slough Exposed Structures: Fascia:  No Fascia: No Fascia: No Fat Layer (Subcutaneous Fat Layer (Subcutaneous Fat Layer (Subcutaneous Tissue) Exposed: No Tissue) Exposed: No Tissue) Exposed: No Tendon: No Tendon: No Tendon: No Muscle: No Muscle: No Muscle: No Joint: No Joint: No Joint: No Bone: No Bone: No Bone: No Epithelialization: None None None Periwound Skin Texture: No Abnormalities Noted No Abnormalities Noted No Abnormalities Noted Periwound Skin Moisture: No Abnormalities Noted Maceration: Yes Maceration: Yes Periwound Skin Color: No Abnormalities Noted No Abnormalities Noted No Abnormalities Noted Temperature: No Abnormality No Abnormality No Abnormality Tenderness on Palpation: Yes No Yes Wound Preparation: Ulcer Cleansing: Ulcer Cleansing: Ulcer Cleansing: Rinsed/Irrigated with Saline Rinsed/Irrigated with Saline Rinsed/Irrigated with Saline Topical Anesthetic Applied: Topical Anesthetic Applied: Topical Anesthetic Applied: None Other: lidocaine 4% None Wound Number: [id number] 6 7  Photos: Wound Location: Right Toe Great - Left Toe Fourth - Right Gluteus Circumfernential Circumfernential Wounding Event: Gradually Appeared Gradually Appeared Gradually Appeared 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 Lower Extremity Comorbid History: Anemia, Arrhythmia, Coronary Anemia, Arrhythmia, Coronary Anemia, Arrhythmia, Coronary Artery Disease, Hypotension, Artery Disease, Hypotension, Artery Disease, Hypotension, Peripheral Arterial Disease, Peripheral Arterial Disease, Peripheral Arterial Disease, Type II Diabetes, Type II Diabetes, Type II Diabetes, Osteoarthritis Osteoarthritis Osteoarthritis Date Acquired: 11/27/2017 11/27/2017 09/18/2016 Weeks of Treatment: 2 2 2  Wound Status: Open Open Open Pending Amputation on Yes Yes No Presentation: Measurements L x W x D 5x4.5x0.1 1.5x6x0.1 1.6x0.8x0.5 (cm) Area (cm) : 17.671 7.069 1.005 Volume  (cm) : 1.767 0.707 0.503 % Reduction in Area: 41.20% -50.00% 32.60% % Reduction in Volume: 41.20% -50.10% 51.90% 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 ([id number]) Necrotic Amount: Large (67-100%) Large (67-100%) Small (1-33%) Necrotic Tissue: Eschar Eschar, Adherent Slough Adherent Slough Exposed Structures: N/A N/A N/A Epithelialization: None None None Periwound Skin Texture: No Abnormalities Noted No Abnormalities Noted Scarring: Yes Excoriation: No Induration: No Callus: No Crepitus: No Rash: No Periwound Skin Moisture: No Abnormalities Noted No Abnormalities Noted Maceration: No Dry/Scaly: No Periwound Skin Color: No Abnormalities Noted No Abnormalities Noted Atrophie Blanche: No Cyanosis: No Ecchymosis: No Erythema: No Hemosiderin Staining: No Mottled: No Pallor: No Rubor: No Temperature: No Abnormality No Abnormality No Abnormality Tenderness on Palpation: No No Yes Wound Preparation: Ulcer Cleansing: Ulcer Cleansing: Ulcer Cleansing: Rinsed/Irrigated with Saline Rinsed/Irrigated with Saline Rinsed/Irrigated with Saline Topical  Anesthetic Applied: Topical Anesthetic Applied: Topical Anesthetic Applied: None None Other: lidocaine Wound Number: [id number] N/A Photos: N/A Wound Location: Left Foot - Lateral Left Toe Third N/A Wounding Event: Gradually Appeared Gradually Appeared N/A 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/Ulcer of the N/A Lower Extremity Lower Extremity Comorbid History: Anemia, Arrhythmia, Coronary Anemia, Arrhythmia, Coronary N/A Artery Disease, Hypotension, Artery Disease,  Hypotension, Peripheral Arterial Disease, Peripheral Arterial Disease, Type II Diabetes, Type II Diabetes, Osteoarthritis Osteoarthritis Date Acquired: 11/27/2017 11/27/2017 N/A Weeks of Treatment: 2 2 N/A Wound Status: Open Open N/A Pending Amputation on Yes Yes N/A Presentation: Measurements L x W x D 7.5x4x0.1 0x0x0 N/A (cm) Area (cm) : 23.562 0 N/A Volume (cm) : 2.356 0 N/A % Reduction in Area: 76.60% 100.00% N/A % Reduction in Volume: 76.60% 100.00% N/A Classification: Grade 2 Grade 2 N/A Tangonan, Analysse S. ([id number]) Exudate Amount: Medium None Present N/A Exudate Type: Serosanguineous N/A N/A Exudate Color: red, brown N/A N/A Wound Margin: Distinct, outline attached Distinct, outline attached N/A Granulation Amount: None Present (0%) None Present (0%) N/A Granulation Quality: N/A N/A N/A Necrotic Amount: Large (67-100%) Large (67-100%) N/A Necrotic Tissue: Eschar Eschar N/A Exposed Structures: N/A N/A N/A Epithelialization: None Large (67-100%) N/A Periwound Skin Texture: No Abnormalities Noted No Abnormalities Noted N/A Periwound Skin Moisture: Maceration: Yes No Abnormalities Noted N/A Periwound Skin Color: No Abnormalities Noted No Abnormalities Noted N/A Temperature: No Abnormality No Abnormality N/A Tenderness on Palpation: No No N/A Wound Preparation: Ulcer Cleansing: Ulcer Cleansing: N/A Rinsed/Irrigated with Saline Rinsed/Irrigated with Saline Topical Anesthetic Applied: Topical Anesthetic Applied: None None Treatment Notes Electronic Signature(s) Signed: 01/06/2018 4:06:48 PM By: Alric Quan Entered By: Alric Quan on 01/06/2018 15:40:44 Koziel, Beanca Chauncey Cruel ([id number]) -------------------------------------------------------------------------------- [place] Details Patient Name: EVETTA, RENNER. Date of Service: 01/06/2018 3:15 PM Medical Record Number: [id number] Patient Account Number: [id number] Date of Birth/Sex: [birth year]  (51 y.o. Female) Treating RN: Carolyne Fiscal, Debi Primary Care Suhas Estis: Delight Stare Other Clinician: Referring Patriciaann Rabanal: Delight Stare Treating Flor Houdeshell/Extender: STONE III, HOYT Weeks in Treatment: 2 Active Inactive ` Abuse / Safety / Falls / Self Care Management Nursing Diagnoses: Potential for falls Goals: Patient will not experience any injury related to falls Date Initiated: 12/18/2017 Target Resolution Date: 02/21/2018 Goal Status: Active Interventions: Assess Activities of Daily Living upon admission and as needed Assess fall risk on admission and as needed Assess: immobility, friction, shearing, incontinence upon admission and as needed Assess impairment of mobility on admission and as needed per policy Assess personal safety and home safety (as indicated) on admission and as needed Assess self care needs on admission and as needed Provide education on basic hygiene Provide education on fall prevention Treatment Activities: Education provided on Basic Hygiene : 12/18/2017 Notes: ` Necrotic Tissue Nursing Diagnoses: Impaired tissue integrity related to necrotic/devitalized tissue Knowledge deficit related to management of necrotic/devitalized tissue Goals: Patient/caregiver will verbalize understanding of reason and process for debridement of necrotic tissue Date Initiated: 12/18/2017 Target Resolution Date: 03/28/2018 Goal Status: Active Interventions: Assess patient pain level pre-, during and post procedure and prior to discharge Provide education on necrotic tissue and debridement process Notes: CHAVELA, JUSTINIANO ([id number]) ` Nutrition Nursing Diagnoses: Imbalanced nutrition Impaired glucose control: actual or potential Potential for alteratiion in Nutrition/Potential for imbalanced nutrition Goals: Patient/caregiver verbalizes understanding of need to maintain therapeutic glucose control per primary care physician Date Initiated: 12/18/2017 Target Resolution  Date: 02/21/2018 Goal Status: Active Interventions: Assess patient nutrition upon admission and as needed per policy Notes: `  Orientation to the Wound Care Program Nursing Diagnoses: Knowledge deficit related to the wound healing center program Goals: Patient/caregiver will verbalize understanding of the [place] Date Initiated: 12/18/2017 Target Resolution Date: 12/27/2017 Goal Status: Active Interventions: Provide education on orientation to the wound center Notes: ` Pain, Acute or Chronic Nursing Diagnoses: Pain, acute or chronic: actual or potential Potential alteration in comfort, pain Goals: Patient/caregiver will verbalize adequate pain control between visits Date Initiated: 12/18/2017 Target Resolution Date: 03/28/2018 Goal Status: Active Interventions: Complete pain assessment as per visit requirements Notes: ` Wound/Skin Impairment [name], [name] ([id number]) Nursing Diagnoses: Impaired tissue integrity Knowledge deficit related to ulceration/compromised skin integrity Goals: Ulcer/skin breakdown will have a volume reduction of 80% by week 12 Date Initiated: 12/18/2017 Target Resolution Date: 03/21/2018 Goal Status: Active Interventions: Assess patient/caregiver ability to perform ulcer/skin care regimen upon admission and as needed Assess ulceration(s) every visit Notes: Electronic Signature(s) Signed: 01/06/2018 4:06:48 PM By: Alric Quan Entered By: Alric Quan on 01/06/2018 15:40:30 Giraud, Veneda S. ([id number]) -------------------------------------------------------------------------------- Pain Assessment Details Patient Name: [name]. Date of Service: 01/06/2018 3:15 PM Medical Record Number: [id number] Patient Account Number: [id number] Date of Birth/Sex: [birth year] (52 y.o. Female) Treating RN: Montey Hora Primary Care Jovahn Breit: Delight Stare Other Clinician: Referring Jozelynn Danielson: Delight Stare Treating  Jamari Moten/Extender: Melburn Hake, HOYT Weeks in Treatment: 2 Active Problems Location of Pain Severity and Description of Pain Patient Has Paino No Site Locations Pain Management and Medication Current Pain Management: Goals for Pain Management hurts a little at night Electronic Signature(s) Signed: 01/06/2018 4:30:06 PM By: Montey Hora Entered By: Montey Hora on 01/06/2018 15:11:13 Herberg, [name] ([id number]) -------------------------------------------------------------------------------- Patient/Caregiver Education Details Patient Name: [name]. Date of Service: 01/06/2018 3:15 PM Medical Record Number: [id number] Patient Account Number: [id number] Date of Birth/Gender: 12-Jul-1966 (52 y.o. Female) Treating RN: Carolyne Fiscal, Debi Primary Care Physician: Delight Stare Other Clinician: Referring Physician: Delight Stare Treating Physician/Extender: Sharalyn Ink in Treatment: 2 Education Assessment Education Provided To: Patient Education Topics Provided Wound/Skin Impairment: Handouts: Caring for Your Ulcer, Other: change dressing as ordered Methods: Demonstration, Explain/Verbal Responses: State content correctly Electronic Signature(s) Signed: 01/06/2018 4:06:48 PM By: Alric Quan Entered By: Alric Quan on 01/06/2018 15:50:28 Vannice, Nayelli S. ([id number]) -------------------------------------------------------------------------------- Wound Assessment Details Patient Name: [name]. Date of Service: 01/06/2018 3:15 PM Medical Record Number: [id number] Patient Account Number: [id number] Date of Birth/Sex: [birth year] (52 y.o. Female) Treating RN: Montey Hora Primary Care Greco Gastelum: Delight Stare Other Clinician: Referring Arina Torry: Delight Stare Treating Armonie Mettler/Extender: STONE III, HOYT Weeks in Treatment: 2 Wound Status Wound Number: 10 Primary Diabetic Wound/Ulcer of the Lower Extremity Etiology: Wound Location: Left Toe Fifth -  Circumfernential Wound Open Wounding Event: Gradually Appeared Status: Date Acquired: 11/27/2017 Comorbid Anemia, Arrhythmia, Coronary Artery Disease, Weeks Of Treatment: 1 History: Hypotension, Peripheral Arterial Disease, Type Clustered Wound: No II Diabetes, Osteoarthritis Photos Photo Uploaded By: Montey Hora on 01/06/2018 15:36:05 Wound Measurements Length: (cm) 3 Width: (cm) 4 Depth: (cm) 0.1 Area: (cm) 9.425 Volume: (cm) 0.942 % Reduction in Area: 0% % Reduction in Volume: 0% Epithelialization: None Tunneling: No Undermining: No Wound Description Classification: Grade 1 Wound Margin: Distinct, outline attached Exudate Amount: Medium Exudate Type: Serosanguineous Exudate Color: red, brown Foul Odor After Cleansing: No Slough/Fibrino Yes Wound Bed Granulation Amount: None Present (0%) Exposed Structure Necrotic Amount: Large (67-100%) Fascia Exposed: No Necrotic Quality: Eschar Fat Layer (Subcutaneous Tissue) Exposed: No Tendon Exposed: No Muscle Exposed: No Joint Exposed: No Bone Exposed: No Periwound Skin  Texture Domenico, Yuki S. ([id number]) Texture Color No Abnormalities Noted: No No Abnormalities Noted: No Moisture Temperature / Pain No Abnormalities Noted: No Temperature: No Abnormality Tenderness on Palpation: Yes Wound Preparation Ulcer Cleansing: Rinsed/Irrigated with Saline Topical Anesthetic Applied: None Treatment Notes Wound #10 (Left, Circumferential Toe Fifth) 1. Cleansed with: Clean wound with Normal Saline 2. Anesthetic Topical Lidocaine 4% cream to wound bed prior to debridement 4. Dressing Applied: Other dressing (specify in notes) 5. Secondary Dressing Applied ABD Pad Non-Adherent pad 7. Secured with Tape Notes idodine Electronic Signature(s) Signed: 01/06/2018 4:30:06 PM By: Montey Hora Entered By: Montey Hora on 01/06/2018 15:25:13 [place]en, [name]  ([id number]) -------------------------------------------------------------------------------- Wound Assessment Details Patient Name: [name]. Date of Service: 01/06/2018 3:15 PM Medical Record Number: [id number] Patient Account Number: [id number] Date of Birth/Sex: [birth year] (52 y.o. Female) Treating RN: Montey Hora Primary Care Floyd Wade: Delight Stare Other Clinician: Referring Alexei Ey: Delight Stare Treating Colter Magowan/Extender: STONE III, HOYT Weeks in Treatment: 2 Wound Status Wound Number: 3 Primary Diabetic Wound/Ulcer of the Lower Extremity Etiology: Wound Location: Right Malleolus - Lateral Wound Open Wounding Event: Pressure Injury Status: Date Acquired: 09/23/2016 Comorbid Anemia, Arrhythmia, Coronary Artery Disease, Weeks Of Treatment: 2 History: Hypotension, Peripheral Arterial Disease, Type Clustered Wound: No II Diabetes, Osteoarthritis Photos Photo Uploaded By: Montey Hora on 01/06/2018 15:36:05 Wound Measurements Length: (cm) 0.6 Width: (cm) 0.8 Depth: (cm) 0.2 Area: (cm) 0.377 Volume: (cm) 0.075 % Reduction in Area: 55.5% % Reduction in Volume: 55.9% Epithelialization: None Tunneling: No Undermining: No Wound Description Classification: Grade 2 Wound Margin: Distinct, outline attached Exudate Amount: Large Exudate Type: Serous Exudate Color: amber Foul Odor After Cleansing: No Slough/Fibrino Yes Wound Bed Granulation Amount: Large (67-100%) Exposed Structure Granulation Quality: Pink Fascia Exposed: No Necrotic Amount: Small (1-33%) Fat Layer (Subcutaneous Tissue) Exposed: No Necrotic Quality: Adherent Slough Tendon Exposed: No Muscle Exposed: No Joint Exposed: No Bone Exposed: No Periwound Skin Texture Salata, Jaryah S. ([id number]) Texture Color No Abnormalities Noted: No No Abnormalities Noted: No Moisture Temperature / Pain No Abnormalities Noted: No Temperature: No Abnormality Maceration: Yes Wound Preparation Ulcer  Cleansing: Rinsed/Irrigated with Saline Topical Anesthetic Applied: Other: lidocaine 4%, Treatment Notes Wound #3 (Right, Lateral Malleolus) 1. Cleansed with: Clean wound with Normal Saline 2. Anesthetic Topical Lidocaine 4% cream to wound bed prior to debridement 3. Peri-wound Care: Skin Prep 4. Dressing Applied: Prisma Ag 5. Secondary Dressing Applied Bordered Foam Dressing Electronic Signature(s) Signed: 01/06/2018 4:30:06 PM By: Montey Hora Entered By: Montey Hora on 01/06/2018 15:18:08 [facility], [facility]. ([id number]) -------------------------------------------------------------------------------- Wound Assessment Details Patient Name: [name]. Date of Service: 01/06/2018 3:15 PM Medical Record Number: [id number] Patient Account Number: [id number] Date of Birth/Sex: [birth year] (52 y.o. Female) Treating RN: Montey Hora Primary Care Maraki Macquarrie: Delight Stare Other Clinician: Referring Elisea Khader: Delight Stare Treating Asherah Lavoy/Extender: STONE III, HOYT Weeks in Treatment: 2 Wound Status Wound Number: 4 Primary Diabetic Wound/Ulcer of the Lower Extremity Etiology: Wound Location: Left Calcaneus - Lateral Wound Open Wounding Event: Blister Status: Date Acquired: 11/27/2017 Comorbid Anemia, Arrhythmia, Coronary Artery Disease, Weeks Of Treatment: 2 History: Hypotension, Peripheral Arterial Disease, Type Clustered Wound: No II Diabetes, Osteoarthritis Pending Amputation On Presentation Photos Photo Uploaded By: Montey Hora on 01/06/2018 15:36:36 Wound Measurements Length: (cm) 1.4 Width: (cm) 1.4 Depth: (cm) 0.1 Area: (cm) 1.539 Volume: (cm) 0.154 % Reduction in Area: 90.5% % Reduction in Volume: 95.3% Epithelialization: None Tunneling: No Undermining: No Wound Description Classification: Grade 2 Wound Margin: Distinct, outline attached Exudate Amount: None Present Foul Odor After  Cleansing: No Slough/Fibrino Yes Wound Bed Granulation Amount:  Small (1-33%) Exposed Structure Granulation Quality: Pink Fascia Exposed: No Necrotic Amount: Large (67-100%) Fat Layer (Subcutaneous Tissue) Exposed: No Necrotic Quality: Eschar, Adherent Slough Tendon Exposed: No Muscle Exposed: No Joint Exposed: No Bone Exposed: No Periwound Skin Texture Texture Color Guerin, Eliani S. ([id number]) No Abnormalities Noted: No No Abnormalities Noted: No Moisture Temperature / Pain No Abnormalities Noted: No Temperature: No Abnormality Maceration: Yes Tenderness on Palpation: Yes Wound Preparation Ulcer Cleansing: Rinsed/Irrigated with Saline Topical Anesthetic Applied: None Treatment Notes Wound #4 (Left, Lateral Calcaneus) 1. Cleansed with: Clean wound with Normal Saline 2. Anesthetic Topical Lidocaine 4% cream to wound bed prior to debridement 4. Dressing Applied: Other dressing (specify in notes) 5. Secondary Dressing Applied ABD Pad Non-Adherent pad 7. Secured with Tape Notes idodine Electronic Signature(s) Signed: 01/06/2018 4:30:06 PM By: Montey Hora Entered By: Montey Hora on 01/06/2018 15:22:43 Clavin, [name] ([id number]) -------------------------------------------------------------------------------- Wound Assessment Details Patient Name: [name]. Date of Service: 01/06/2018 3:15 PM Medical Record Number: [id number] Patient Account Number: [id number] Date of Birth/Sex: Sep 28, 1966 (52 y.o. Female) Treating RN: Montey Hora Primary Care Jaqwan Wieber: Delight Stare Other Clinician: Referring Travon Crochet: Delight Stare Treating Mistey Hoffert/Extender: STONE III, HOYT Weeks in Treatment: 2 Wound Status Wound Number: 5 Primary Diabetic Wound/Ulcer of the Lower Extremity Etiology: Wound Location: Right Toe Great - Circumfernential Wound Open Wounding Event: Gradually Appeared Status: Date Acquired: 11/27/2017 Comorbid Anemia, Arrhythmia, Coronary Artery Disease, Weeks Of Treatment: 2 History: Hypotension, Peripheral  Arterial Disease, Type Clustered Wound: No II Diabetes, Osteoarthritis Pending Amputation On Presentation Photos Photo Uploaded By: Montey Hora on 01/06/2018 15:36:36 Wound Measurements Length: (cm) 5 Width: (cm) 4.5 Depth: (cm) 0.1 Area: (cm) 17.671 Volume: (cm) 1.767 % Reduction in Area: 41.2% % Reduction in Volume: 41.2% Epithelialization: None Tunneling: No Undermining: No Wound Description Classification: Grade 2 Wound Margin: Distinct, outline attached Exudate Amount: None Present Foul Odor After Cleansing: No Slough/Fibrino Yes Wound Bed Granulation Amount: None Present (0%) Necrotic Amount: Large (67-100%) Necrotic Quality: Eschar Periwound Skin Texture Texture Color No Abnormalities Noted: No No Abnormalities Noted: No Moisture Temperature / Pain No Abnormalities Noted: No Temperature: No Abnormality Wound Preparation Rengel, Kaedance S. ([id number]) Ulcer Cleansing: Rinsed/Irrigated with Saline Topical Anesthetic Applied: None Electronic Signature(s) Signed: 01/06/2018 4:30:06 PM By: Montey Hora Entered By: Montey Hora on 01/06/2018 15:25:56 Willmon, Nyliah S. ([id number]) -------------------------------------------------------------------------------- Wound Assessment Details Patient Name: [name]. Date of Service: 01/06/2018 3:15 PM Medical Record Number: [id number] Patient Account Number: [id number] Date of Birth/Sex: [birth year] (52 y.o. Female) Treating RN: Montey Hora Primary Care Eris Breck: Delight Stare Other Clinician: Referring Macoy Rodwell: Delight Stare Treating Enis Leatherwood/Extender: STONE III, HOYT Weeks in Treatment: 2 Wound Status Wound Number: 6 Primary Diabetic Wound/Ulcer of the Lower Extremity Etiology: Wound Location: Left Toe Fourth - Circumfernential Wound Open Wounding Event: Gradually Appeared Status: Date Acquired: 11/27/2017 Comorbid Anemia, Arrhythmia, Coronary Artery Disease, Weeks Of Treatment: 2 History:  Hypotension, Peripheral Arterial Disease, Type Clustered Wound: No II Diabetes, Osteoarthritis Pending Amputation On Presentation Photos Photo Uploaded By: Montey Hora on 01/06/2018 15:37:03 Wound Measurements Length: (cm) 1.5 Width: (cm) 6 Depth: (cm) 0.1 Area: (cm) 7.069 Volume: (cm) 0.707 % Reduction in Area: -50% % Reduction in Volume: -50.1% Epithelialization: None Tunneling: No Undermining: No Wound Description Classification: Grade 2 Wound Margin: Distinct, outline attached Exudate Amount: Large Exudate Type: Serosanguineous Exudate Color: red, brown Foul Odor After Cleansing: No Slough/Fibrino Yes Wound Bed Granulation Amount: None Present (0%) Necrotic Amount: Large (67-100%) Necrotic Quality:  Eschar, Adherent Slough Periwound Skin Texture Texture Color No Abnormalities Noted: No No Abnormalities Noted: No Moisture Temperature / Pain Dung, Lynnette S. ([id number]) No Abnormalities Noted: No Temperature: No Abnormality Wound Preparation Ulcer Cleansing: Rinsed/Irrigated with Saline Topical Anesthetic Applied: None Treatment Notes Wound #6 (Left, Circumferential Toe Fourth) 1. Cleansed with: Clean wound with Normal Saline 2. Anesthetic Topical Lidocaine 4% cream to wound bed prior to debridement 4. Dressing Applied: Other dressing (specify in notes) 5. Secondary Dressing Applied ABD Pad Non-Adherent pad 7. Secured with Tape Notes idodine Electronic Signature(s) Signed: 01/06/2018 4:30:06 PM By: Montey Hora Entered By: Montey Hora on 01/06/2018 15:26:40 Fencl, [name] ([id number]) -------------------------------------------------------------------------------- Wound Assessment Details Patient Name: [name]. Date of Service: 01/06/2018 3:15 PM Medical Record Number: [id number] Patient Account Number: [id number] Date of Birth/Sex: 1966-07-14 (52 y.o. Female) Treating RN: Montey Hora Primary Care Kaliope Quinonez: Delight Stare Other  Clinician: Referring Levone Otten: Delight Stare Treating Eniola Cerullo/Extender: STONE III, HOYT Weeks in Treatment: 2 Wound Status Wound Number: 7 Primary Pressure Ulcer Etiology: Wound Location: Right Gluteus Wound Open Wounding Event: Gradually Appeared Status: Date Acquired: 09/18/2016 Comorbid Anemia, Arrhythmia, Coronary Artery Disease, Weeks Of Treatment: 2 History: Hypotension, Peripheral Arterial Disease, Type Clustered Wound: No II Diabetes, Osteoarthritis Photos Photo Uploaded By: Montey Hora on 01/06/2018 15:37:03 Wound Measurements Length: (cm) 1.6 Width: (cm) 0.8 Depth: (cm) 0.5 Area: (cm) 1.005 Volume: (cm) 0.503 % Reduction in Area: 32.6% % Reduction in Volume: 51.9% Epithelialization: None Tunneling: No Undermining: No Wound Description Classification: Category/Stage IV Wound Margin: Distinct, outline attached Exudate Amount: Large Exudate Type: Serosanguineous Exudate Color: red, brown Foul Odor After Cleansing: No Slough/Fibrino Yes Wound Bed Granulation Amount: Large (67-100%) Granulation Quality: Red Necrotic Amount: Small (1-33%) Necrotic Quality: Adherent Slough Periwound Skin Texture Texture Color No Abnormalities Noted: No No Abnormalities Noted: No Callus: No Atrophie Blanche: No Ziolkowski, Clodagh S. ([id number]) Crepitus: No Cyanosis: No Excoriation: No Ecchymosis: No Induration: No Erythema: No Rash: No Hemosiderin Staining: No Scarring: Yes Mottled: No Pallor: No Moisture Rubor: No No Abnormalities Noted: No Dry / Scaly: No Temperature / Pain Maceration: No Temperature: No Abnormality Tenderness on Palpation: Yes Wound Preparation Ulcer Cleansing: Rinsed/Irrigated with Saline Topical Anesthetic Applied: Other: lidocaine, Treatment Notes Wound #7 (Right Gluteus) 1. Cleansed with: Clean wound with Normal Saline 2. Anesthetic Topical Lidocaine 4% cream to wound bed prior to debridement 3. Peri-wound Care: Skin Prep 4.  Dressing Applied: Prisma Ag 5. Secondary Dressing Applied Bordered Foam Dressing Electronic Signature(s) Signed: 01/06/2018 4:30:06 PM By: Montey Hora Entered By: Montey Hora on 01/06/2018 15:31:34 Eisenhour, [name] ([id number]) -------------------------------------------------------------------------------- Wound Assessment Details Patient Name: [name]. Date of Service: 01/06/2018 3:15 PM Medical Record Number: [id number] Patient Account Number: [id number] Date of Birth/Sex: 1965/12/13 (51 y.o. Female) Treating RN: Montey Hora Primary Care Anthoney Sheppard: Delight Stare Other Clinician: Referring Janasia Coverdale: Delight Stare Treating Rowen [name]/Extender: STONE III, HOYT Weeks in Treatment: 2 Wound Status Wound Number: 8 Primary Diabetic Wound/Ulcer of the Lower Extremity Etiology: Wound Location: Left Foot - Lateral Wound Open Wounding Event: Gradually Appeared Status: Date Acquired: 11/27/2017 Comorbid Anemia, Arrhythmia, Coronary Artery Disease, Weeks Of Treatment: 2 History: Hypotension, Peripheral Arterial Disease, Type Clustered Wound: No II Diabetes, Osteoarthritis Pending Amputation On Presentation Photos Photo Uploaded By: Montey Hora on 01/06/2018 15:37:38 Wound Measurements Length: (cm) 7.5 Width: (cm) 4 Depth: (cm) 0.1 Area: (cm) 23.562 Volume: (cm) 2.356 % Reduction in Area: 76.6% % Reduction in Volume: 76.6% Epithelialization: None Tunneling: No Undermining: No Wound Description Classification: Grade 2 Wound Margin:  Distinct, outline attached Exudate Amount: Medium Exudate Type: Serosanguineous Exudate Color: red, brown Foul Odor After Cleansing: No Slough/Fibrino Yes Wound Bed Granulation Amount: None Present (0%) Necrotic Amount: Large (67-100%) Necrotic Quality: Eschar Periwound Skin Texture Texture Color No Abnormalities Noted: No No Abnormalities Noted: No Moisture Temperature / Pain Exline, Esperanza S. ([id number]) No Abnormalities  Noted: No Temperature: No Abnormality Maceration: Yes Wound Preparation Ulcer Cleansing: Rinsed/Irrigated with Saline Topical Anesthetic Applied: None Treatment Notes Wound #8 (Left, Lateral Foot) 1. Cleansed with: Clean wound with Normal Saline 2. Anesthetic Topical Lidocaine 4% cream to wound bed prior to debridement 4. Dressing Applied: Other dressing (specify in notes) 5. Secondary Dressing Applied ABD Pad Non-Adherent pad 7. Secured with Tape Notes idodine Electronic Signature(s) Signed: 01/06/2018 4:30:06 PM By: Montey Hora Entered By: Montey Hora on 01/06/2018 15:27:22 Fuelling, [name] ([id number]) -------------------------------------------------------------------------------- Wound Assessment Details Patient Name: [name]. Date of Service: 01/06/2018 3:15 PM Medical Record Number: [id number] Patient Account Number: [id number] Date of Birth/Sex: 10-21-1966 (52 y.o. Female) Treating RN: Montey Hora Primary Care Rama Mcclintock: Delight Stare Other Clinician: Referring Kaja Jackowski: Delight Stare Treating Niels Cranshaw/Extender: STONE III, HOYT Weeks in Treatment: 2 Wound Status Wound Number: 9 Primary Diabetic Wound/Ulcer of the Lower Extremity Etiology: Wound Location: Left Toe Third Wound Open Wounding Event: Gradually Appeared Status: Date Acquired: 11/27/2017 Comorbid Anemia, Arrhythmia, Coronary Artery Disease, Weeks Of Treatment: 2 History: Hypotension, Peripheral Arterial Disease, Type Clustered Wound: No II Diabetes, Osteoarthritis Pending Amputation On Presentation Photos Photo Uploaded By: Montey Hora on 01/06/2018 15:37:39 Wound Measurements Length: (cm) 0 % Re Width: (cm) 0 % Re Depth: (cm) 0 Epit Area: (cm) 0 Tun Volume: (cm) 0 Und duction in Area: 100% duction in Volume: 100% helialization: Large (67-100%) neling: No ermining: No Wound Description Classification: Grade 2 Wound Margin: Distinct, outline attached Exudate Amount:  None Present Foul Odor After Cleansing: No Slough/Fibrino No Wound Bed Granulation Amount: None Present (0%) Necrotic Amount: Large (67-100%) Necrotic Quality: Eschar Periwound Skin Texture Texture Color No Abnormalities Noted: No No Abnormalities Noted: No Moisture Temperature / Pain No Abnormalities Noted: No Temperature: No Abnormality Wound Preparation [place], [place] S. ([id number]) Ulcer Cleansing: Rinsed/Irrigated with Saline Topical Anesthetic Applied: None Electronic Signature(s) Signed: 01/06/2018 4:30:06 PM By: Montey Hora Entered By: Montey Hora on 01/06/2018 15:28:45 Black, Teka Chauncey Cruel ([id number]) -------------------------------------------------------------------------------- Vitals Details Patient Name: [name]. Date of Service: 01/06/2018 3:15 PM Medical Record Number: [id number] Patient Account Number: [id number] Date of Birth/Sex: 08-Feb-1966 (52 y.o. Female) Treating RN: Montey Hora Primary Care Zen Cedillos: Delight Stare Other Clinician: Referring Reisha Wos: Delight Stare Treating Latrisa Hellums/Extender: STONE III, HOYT Weeks in Treatment: 2 Vital Signs Time Taken: 15:11 Temperature (F): 97.8 Height (in): 67 Pulse (bpm): 86 Respiratory Rate (breaths/min): 18 Blood Pressure (mmHg): 96/61 Reference Range: 80 - 120 mg / dl Electronic Signature(s) Signed: 01/06/2018 4:30:06 PM By: Montey Hora Entered By: Montey Hora on 01/06/2018 15:13:15

## 2018-01-09 ENCOUNTER — Encounter (HOSPITAL_COMMUNITY): Payer: Medicare Other

## 2018-01-12 ENCOUNTER — Ambulatory Visit: Payer: Medicare Other | Admitting: Surgery

## 2018-01-13 ENCOUNTER — Ambulatory Visit: Payer: Medicare Other | Admitting: Physician Assistant

## 2018-01-14 ENCOUNTER — Ambulatory Visit: Payer: Medicare Other | Admitting: Internal Medicine

## 2018-01-14 ENCOUNTER — Encounter: Payer: Self-pay | Admitting: Internal Medicine

## 2018-01-14 ENCOUNTER — Other Ambulatory Visit: Payer: Self-pay

## 2018-01-14 VITALS — BP 142/60 | HR 89 | Ht 67.0 in | Wt 170.5 lb

## 2018-01-14 DIAGNOSIS — I70233 Atherosclerosis of native arteries of right leg with ulceration of ankle: Secondary | ICD-10-CM

## 2018-01-14 DIAGNOSIS — E785 Hyperlipidemia, unspecified: Secondary | ICD-10-CM

## 2018-01-14 DIAGNOSIS — I739 Peripheral vascular disease, unspecified: Secondary | ICD-10-CM | POA: Diagnosis not present

## 2018-01-14 DIAGNOSIS — I480 Paroxysmal atrial fibrillation: Secondary | ICD-10-CM

## 2018-01-14 MED ORDER — METOPROLOL TARTRATE 25 MG PO TABS: 12.5000 mg | ORAL_TABLET | Freq: Two times a day (BID) | ORAL | 3 refills | Status: DC

## 2018-01-14 NOTE — Patient Instructions (Signed)
Medication Instructions:  Your physician has recommended you make the following change in your medication:  1- START Metoprolol tartrate 12.5 mg (1/2 tablet) by  Mouth two times a day.   Labwork: none  Testing/Procedures: none  Follow-Up: Your physician recommends that you schedule a follow-up appointment in: 3 MONTHS WITH DR END.   If you need a refill on your cardiac medications before your next appointment, please call your pharmacy.

## 2018-01-14 NOTE — Progress Notes (Signed)
Follow-up Outpatient Visit Date: 01/14/2018  Primary Care Provider: [name], MD [address] [place] [address]  Chief Complaint: Follow-up atrial fibrillation  HPI:  [name] is a 52 y.o. year-old female with history of extensive peripheral vascular disease, post-operative atrial fibrillation, hypertension, hyperlipidemia, diabetes mellitus, asthma, and recurrent wounds complicated by osteomyelitis, who presents for follow-up of atrial fibrillation.  I last saw [name] in August, at which time she was doing well from a heart standpoint.  She was still struggling with her poor wound healing.  She presented to [facility] in early January with worsening left leg pain due to occlusion of femoropopliteal bypass graft.  She underwent urgent vascular surgery for treatment of occluded left femoropopliteal bypass and left iliac artery occlusion.  She had a lengthy hospitalization complicated by atrial fibrillation with rapid ventricular response.  She was discharged on rivaroxaban, which she is tolerating well without bleeding.  Since leaving the hospital, Ms. [name] denies chest pain, shortness of breath, palpitations, lightheadedness, and orthopnea.  She has chronic bilateral calf edema, which is slightly worse compared to before her hospitalization.  She continues to have intermittent left foot pain.  She is receiving wound care at home for her left foot and right ankle wounds.  Her right buttock wound is also gradually healing though still remains painful at times.  She is scheduled for follow-up with Dr. Trula Slade next month.  She is working with PT to improve her ambulatory function.  --------------------------------------------------------------------------------------------------  Past Medical History:  Diagnosis Date  . Arthritis   . Asthma   . Chronic back pain   . Depression   . Diabetes mellitus   . Diabetic neuropathy ([place])   . Family history of adverse reaction to  anesthesia    mother had difficlty waking   . Femoral-popliteal bypass graft occlusion, left ([place]) 12/02/2017  . GERD (gastroesophageal reflux disease)   . Hyperlipidemia   . Osteomyelitis of right fibula ([place]) 03/05/2017  . PAD (peripheral artery disease) ([place])   . Paroxysmal atrial fibrillation with rapid ventricular response ([place]) 12/02/2017  . Ulcer    Foot   Past Surgical History:  Procedure Laterality Date  . ABDOMINAL AORTAGRAM  June 15, 2014  . ABDOMINAL AORTAGRAM N/A 06/15/2014   Procedure: ABDOMINAL Maxcine Ham;  Surgeon: Serafina Mitchell, MD;  Location: [facility] CATH LAB;  Service: Cardiovascular;  Laterality: N/A;  . ABDOMINAL AORTAGRAM N/A 11/22/2014   Procedure: ABDOMINAL AORTAGRAM;  Surgeon: Serafina Mitchell, MD;  Location: [facility] CATH LAB;  Service: Cardiovascular;  Laterality: N/A;  . ABDOMINAL AORTOGRAM W/LOWER EXTREMITY N/A 01/07/2017   Procedure: Abdominal Aortogram w/Lower Extremity;  Surgeon: Serafina Mitchell, MD;  Location: [place] CV LAB;  Service: Cardiovascular;  Laterality: N/A;  . ABDOMINAL AORTOGRAM W/LOWER EXTREMITY N/A 10/31/2017   Procedure: ABDOMINAL AORTOGRAM W/LOWER EXTREMITY;  Surgeon: Elam Dutch, MD;  Location: [place] CV LAB;  Service: Cardiovascular;  Laterality: N/A;  . AORTA - BILATERAL FEMORAL ARTERY BYPASS GRAFT N/A 11/28/2017   Procedure: AORTA BIFEMORAL BYPASS USING HEMASHIELD GOLD GRAFT & REIMPLANT IMA;  Surgeon: Serafina Mitchell, MD;  [facility] OR;  Service: Vascular;  Laterality: N/A;  . AORTIC ARCH ANGIOGRAPHY N/A 10/31/2017   Procedure: AORTIC ARCH ANGIOGRAPHY;  Surgeon: Elam Dutch, MD;  Location: [place] CV LAB;  Service: Cardiovascular;  Laterality: N/A;  . APPLICATION OF WOUND VAC  11/28/2017   Procedure: APPLICATION OF WOUND VAC;  Surgeon: Serafina Mitchell, MD;  Location: [facility]  OR;  Service: Vascular;;  . ARTERIAL BYPASS SURGRY  07/05/2010   Right Common Femoral to below knee popliteal BPG  . BACK SURGERY     X's  2  . CARDIAC  CATHETERIZATION    . CHOLECYSTECTOMY     Gall Bladder  . CYSTECTOMY Right    foot  . CYSTECTOMY Left    wrist  . EMBOLECTOMY Left 11/28/2017   Procedure: Left Lower Extremity Embolectomy, Left Tibial Peroneal Trunk Endarterectomy with Patch Angioplasty; Vein Harvest Small Saphenous Graft Left Lower Leg;  Surgeon: [name], MD;  Location: [place];  Service: Vascular;  Laterality: Left;  . INTERCOSTAL NERVE BLOCK  November 2015  . INTRAOPERATIVE ARTERIOGRAM  11/28/2017   Procedure: INTRA OPERATIVE ARTERIOGRAM OF LEFT LEG;  Surgeon: Serafina Mitchell, MD;  Location: [place];  Service: Vascular;;  . IR FLUORO GUIDE CV LINE RIGHT  03/20/2017  . IR US GUIDE VASC ACCESS RIGHT  03/20/2017  . IRRIGATION AND DEBRIDEMENT BUTTOCKS Right 09/30/2016   Procedure: DEBRIDEMENT RIGHT  BUTTOCK WOUND;  Surgeon: Georganna Skeans, MD;  Location: [place];  Service: General;  Laterality: Right;  . left foot surgery    . left wrist cyst removal Left   . PERIPHERAL VASCULAR CATHETERIZATION N/A 05/07/2016   Procedure: Abdominal Aortogram;  Surgeon: Serafina Mitchell, MD;  Location: [place] CV LAB;  Service: Cardiovascular;  Laterality: N/A;  . PERIPHERAL VASCULAR CATHETERIZATION N/A 05/07/2016   Procedure: Lower Extremity Angiography;  Surgeon: Serafina Mitchell, MD;  Location: [place] CV LAB;  Service: Cardiovascular;  Laterality: N/A;  . PERIPHERAL VASCULAR CATHETERIZATION N/A 05/07/2016   Procedure: Aortic Arch Angiography;  Surgeon: Serafina Mitchell, MD;  Location: [place] CV LAB;  Service: Cardiovascular;  Laterality: N/A;  . PERIPHERAL VASCULAR CATHETERIZATION N/A 05/07/2016   Procedure: Upper Extremity Angiography;  Surgeon: Serafina Mitchell, MD;  Location: [place] CV LAB;  Service: Cardiovascular;  Laterality: N/A;  . PERIPHERAL VASCULAR CATHETERIZATION Right 05/07/2016   Procedure: Peripheral Vascular Balloon Angioplasty;  Surgeon: Serafina Mitchell, MD;  Location: [place] CV LAB;  Service:  Cardiovascular;  Laterality: Right;  subclavian  . PERIPHERAL VASCULAR CATHETERIZATION Right 05/07/2016   Procedure: Peripheral Vascular Intervention;  Surgeon: Serafina Mitchell, MD;  Location: [place] CV LAB;  Service: Cardiovascular;  Laterality: Right;  External  Iliac  . SKIN GRAFT Right 2012   [name] by Dr. Nils Pyle- Right and Left Ankle  . SPINE SURGERY    . THROMBECTOMY FEMORAL ARTERY  11/28/2017   Procedure: THROMBECTOMY  & REVISION OF BILATERAL FEMORAL TO POPLETEAL ARTERIES;  Surgeon: Serafina Mitchell, MD;  Location: [facility] OR;  Service: Vascular;;  . TONSILLECTOMY      Recent CV Pertinent Labs: Lab Results  Component Value Date   CHOL 213 (H) 01/16/2017   HDL 32 (L) 01/16/2017   LDLCALC 119 (H) 01/16/2017   TRIG 310 (H) 01/16/2017   CHOLHDL 6.7 (H) 01/16/2017   INR 1.40 11/28/2017   K 3.9 12/08/2017   MG 1.7 12/05/2017   BUN 11 12/08/2017   CREATININE 0.76 12/08/2017   CREATININE 1.24 (H) 08/12/2017    Past medical and surgical history were reviewed and updated in EPIC.  Current Meds  Medication Sig  . albuterol (PROVENTIL) 2 MG tablet Take 2 mg by mouth 3 (three) times daily as needed for shortness of breath.   . cetirizine (ZYRTEC) 10 MG tablet Take 10 mg by mouth daily as needed for allergies.   Marland Kitchen  dicyclomine (BENTYL) 10 MG capsule Take 10 mg by mouth 4 (four) times daily as needed for spasms.  Marland Kitchen HUMALOG KWIKPEN 100 UNIT/ML KiwkPen Inject 10 Units into the skin See admin instructions. 10 units three times a day before meals per sliding scale  . HYDROcodone-acetaminophen (NORCO) 10-325 MG tablet Take 1 tablet by mouth 2 (two) times daily.   Marland Kitchen LANTUS SOLOSTAR 100 UNIT/ML Solostar Pen Inject 36 Units into the skin 2 (two) times daily.   Marland Kitchen omeprazole (PRILOSEC) 40 MG capsule Take 40 mg by mouth daily.  . ondansetron (ZOFRAN ODT) 4 MG disintegrating tablet Take 1 tablet (4 mg total) by mouth every 8 (eight) hours as needed for nausea or vomiting.  . pregabalin (LYRICA) 100 MG  capsule Limit 1 tab by mouth twice a day to 3 times a day if tolerated (Patient taking differently: Take 100 mg by mouth 3 (three) times daily. )  . rivaroxaban (XARELTO) 20 MG TABS tablet Take 1 tablet (20 mg total) by mouth daily with supper.  . rosuvastatin (CRESTOR) 20 MG tablet Take 1 tablet (20 mg total) by mouth daily at 6 PM.    Allergies: Patient has no known allergies.  Social History   Socioeconomic History  . Marital status: Married    Spouse name: Not on file  . Number of children: Not on file  . Years of education: Not on file  . Highest education level: Not on file  Social Needs  . Financial resource strain: Not on file  . Food insecurity - worry: Not on file  . Food insecurity - inability: Not on file  . Transportation needs - medical: Not on file  . Transportation needs - non-medical: Not on file  Occupational History  . Occupation: disabled  Tobacco Use  . Smoking status: Former Smoker    Years: 30.00    Types: Cigarettes    Last attempt to quit: 02/07/2017    Years since quitting: 0.9  . Smokeless tobacco: Never Used  Substance and Sexual Activity  . Alcohol use: No    Alcohol/week: 0.0 oz  . Drug use: No  . Sexual activity: Yes    Birth control/protection: Post-menopausal  Other Topics Concern  . Not on file  Social History Narrative  . Not on file    Family History  Problem Relation Age of Onset  . Coronary artery disease Mother   . Peripheral vascular disease Mother   . Heart disease Mother        Before age 1  . Other Mother        Venous insuffiency  . Diabetes Mother   . Hyperlipidemia Mother   . Hypertension Mother   . Varicose Veins Mother   . Heart attack Mother        before age 105  . Heart disease Father   . Diabetes Father   . Diabetes Maternal Grandmother   . Diabetes Paternal Grandmother   . Diabetes Paternal Grandfather   . Diabetes Sister   . Hypertension Sister   . Diabetes Brother   . Hypertension Brother      Review of Systems: A 12-system review of systems was performed and was negative except as noted in the HPI.  --------------------------------------------------------------------------------------------------  Physical Exam: BP (!) 142/60 (BP Location: Left Arm, Patient Position: Sitting, Cuff Size: Normal)   Pulse 89   Ht 5\' 7"  (1.702 m)   Wt 170 lb 8 oz (77.3 kg)   BMI 26.70 kg/m   General: Overweight,  chronically ill-appearing woman, seated in a wheelchair. HEENT: No conjunctival pallor or scleral icterus. Moist mucous membranes.  OP clear. Neck: Supple without lymphadenopathy, thyromegaly, JVD, or HJR. Lungs: Normal work of breathing. Clear to auscultation bilaterally without wheezes or crackles. Heart: Regular rate and rhythm without murmurs, rubs, or gallops. Non-displaced PMI. Abd: Bowel sounds present. Soft mild lower abdominal tenderness. Ext: Pretibial edema bilaterally.  Left foot is wrapped.  Superficial ulceration noted just above the right ankle.  No erythema or discharge.  Skin: Warm and dry  EKG: Normal sinus rhythm with single PVC.  Low voltage.  Poor R wave progression which may reflect lead placement.  Lab Results  Component Value Date   WBC 15.2 (H) 12/09/2017   HGB 8.4 (L) 12/09/2017   HCT 26.2 (L) 12/09/2017   MCV 87.0 12/09/2017   PLT 262 12/09/2017    Lab Results  Component Value Date   NA 134 (L) 12/08/2017   K 3.9 12/08/2017   CL 99 (L) 12/08/2017   CO2 25 12/08/2017   BUN 11 12/08/2017   CREATININE 0.76 12/08/2017   GLUCOSE 276 (H) 12/08/2017   ALT 10 (L) 12/05/2017    Lab Results  Component Value Date   CHOL 213 (H) 01/16/2017   HDL 32 (L) 01/16/2017   LDLCALC 119 (H) 01/16/2017   TRIG 310 (H) 01/16/2017   CHOLHDL 6.7 (H) 01/16/2017    --------------------------------------------------------------------------------------------------  ASSESSMENT AND PLAN: Paroxysmal atrial fibrillation Patient is in sinus rhythm today.  She has  not had any symptoms to suggest recurrence of atrial fibrillation, though it should be noted that she was asymptomatic while in atrial fibrillation with rapid ventricular response during her hospitalization last month.  We have agreed to continue rivaroxaban, given CHADSVASC score of at least 3.  I will add metoprolol tartrate 12.5 mg twice daily for heart rate control.  Peripheral vascular disease Patient still undergoing wound care for gangrenous left toes as well as right ankle and buttock wounds.  Continue follow-up with Dr. Trula Slade, as well as aggressive secondary prevention.  Hyperlipidemia Continue high intensity statin therapy; goal LDL less than 70.  Follow-up: Return to clinic in 3 months.  Nelva Bush, MD 01/14/2018 2:57 PM

## 2018-01-16 ENCOUNTER — Encounter: Payer: Medicare Other | Attending: Physician Assistant | Admitting: Physician Assistant

## 2018-01-16 DIAGNOSIS — E1151 Type 2 diabetes mellitus with diabetic peripheral angiopathy without gangrene: Secondary | ICD-10-CM | POA: Diagnosis not present

## 2018-01-16 DIAGNOSIS — L98412 Non-pressure chronic ulcer of buttock with fat layer exposed: Secondary | ICD-10-CM | POA: Diagnosis not present

## 2018-01-16 DIAGNOSIS — L97529 Non-pressure chronic ulcer of other part of left foot with unspecified severity: Secondary | ICD-10-CM | POA: Diagnosis not present

## 2018-01-16 DIAGNOSIS — Z7984 Long term (current) use of oral hypoglycemic drugs: Secondary | ICD-10-CM | POA: Insufficient documentation

## 2018-01-16 DIAGNOSIS — G8929 Other chronic pain: Secondary | ICD-10-CM | POA: Diagnosis not present

## 2018-01-16 DIAGNOSIS — S31819S Unspecified open wound of right buttock, sequela: Secondary | ICD-10-CM | POA: Diagnosis not present

## 2018-01-16 DIAGNOSIS — E11622 Type 2 diabetes mellitus with other skin ulcer: Secondary | ICD-10-CM | POA: Insufficient documentation

## 2018-01-16 DIAGNOSIS — Z8249 Family history of ischemic heart disease and other diseases of the circulatory system: Secondary | ICD-10-CM | POA: Diagnosis not present

## 2018-01-16 DIAGNOSIS — L97922 Non-pressure chronic ulcer of unspecified part of left lower leg with fat layer exposed: Secondary | ICD-10-CM | POA: Insufficient documentation

## 2018-01-16 DIAGNOSIS — F17210 Nicotine dependence, cigarettes, uncomplicated: Secondary | ICD-10-CM | POA: Diagnosis not present

## 2018-01-16 DIAGNOSIS — E1140 Type 2 diabetes mellitus with diabetic neuropathy, unspecified: Secondary | ICD-10-CM | POA: Diagnosis not present

## 2018-01-16 DIAGNOSIS — L97522 Non-pressure chronic ulcer of other part of left foot with fat layer exposed: Secondary | ICD-10-CM | POA: Diagnosis not present

## 2018-01-16 DIAGNOSIS — L97819 Non-pressure chronic ulcer of other part of right lower leg with unspecified severity: Secondary | ICD-10-CM | POA: Diagnosis not present

## 2018-01-16 DIAGNOSIS — L97312 Non-pressure chronic ulcer of right ankle with fat layer exposed: Secondary | ICD-10-CM | POA: Insufficient documentation

## 2018-01-16 DIAGNOSIS — L97422 Non-pressure chronic ulcer of left heel and midfoot with fat layer exposed: Secondary | ICD-10-CM | POA: Insufficient documentation

## 2018-01-16 DIAGNOSIS — I4891 Unspecified atrial fibrillation: Secondary | ICD-10-CM | POA: Insufficient documentation

## 2018-01-16 DIAGNOSIS — E11621 Type 2 diabetes mellitus with foot ulcer: Secondary | ICD-10-CM | POA: Insufficient documentation

## 2018-01-16 DIAGNOSIS — X58XXXS Exposure to other specified factors, sequela: Secondary | ICD-10-CM | POA: Diagnosis not present

## 2018-01-16 DIAGNOSIS — I2510 Atherosclerotic heart disease of native coronary artery without angina pectoris: Secondary | ICD-10-CM | POA: Diagnosis not present

## 2018-01-16 DIAGNOSIS — M1990 Unspecified osteoarthritis, unspecified site: Secondary | ICD-10-CM | POA: Diagnosis not present

## 2018-01-19 NOTE — Progress Notes (Signed)
[name] ([id number]) Visit Report for 01/16/2018 Arrival Information Details Patient Name: [name], [name]. Date of Service: 01/16/2018 2:45 PM Medical Record Number: [id number] Patient Account Number: [id number] Date of Birth/Sex: [birth year] (52 y.o. Female) Treating [name]: [name] Primary Care [name]: [name] Other Clinician: Referring [name]: [name] Treating Velencia Lenart/[name]: [name], [name] Weeks in Treatment: 4 Visit Information History Since Last Visit All ordered tests and consults were completed: No Patient Arrived: [name]: No Arrival Time: 15:28 Any new allergies or adverse reactions: No Accompanied By: self Had a fall or experienced change in No Transfer Assistance: None activities of daily living that may affect Patient Identification Verified: Yes risk of falls: Secondary Verification Process Yes Signs or symptoms of abuse/neglect since last visito No Completed: Hospitalized since last visit: No Patient Requires Transmission-Based No Pain Present Now: Yes Precautions: Patient Has Alerts: Yes Patient Alerts: Patient on Blood Thinner DM II Xarelto Electronic Signature(s) Signed: 01/16/2018 5:56:09 PM By: [name] Entered By: [name] on 01/16/2018 15:29:12 [name], [name]. ([id number]) -------------------------------------------------------------------------------- Clinic Level of Care Assessment Details Patient Name: [name]. Date of Service: 01/16/2018 2:45 PM Medical Record Number: [id number] Patient Account Number: [id number] Date of Birth/Sex: Jun 28, 1966 (52 y.o. Female) Treating [name]: [name] Primary Care Lacharles Altschuler: [name] Other Clinician: Referring [name]: [name] Treating [name]/[name]: [name], [name] Weeks in Treatment: 4 Clinic Level of Care Assessment Items TOOL 4 Quantity Score []  - Use when only an EandM is performed on FOLLOW-UP visit 0 ASSESSMENTS  - Nursing Assessment / Reassessment []  - Reassessment of Co-morbidities (includes updates in patient status) 0 X- 1 5 Reassessment of Adherence to Treatment Plan ASSESSMENTS - Wound and Skin Assessment / Reassessment []  - Simple Wound Assessment / Reassessment - one wound 0 X- 8 5 Complex Wound Assessment / Reassessment - multiple wounds []  - 0 Dermatologic / Skin Assessment (not related to wound area) ASSESSMENTS - Focused Assessment []  - Circumferential Edema Measurements - multi extremities 0 []  - 0 Nutritional Assessment / Counseling / Intervention []  - 0 Lower Extremity Assessment (monofilament, tuning fork, pulses) []  - 0 Peripheral Arterial Disease Assessment (using hand held doppler) ASSESSMENTS - Ostomy and/or Continence Assessment and Care []  - Incontinence Assessment and Management 0 []  - 0 Ostomy Care Assessment and Management (repouching, etc.) PROCESS - Coordination of Care X - Simple Patient / Family Education for ongoing care 1 15 []  - 0 Complex (extensive) Patient / Family Education for ongoing care X- 1 10 Staff obtains Programmer, systems, Records, Test Results / Process Orders []  - 0 Staff telephones HHA, Nursing Homes / Clarify orders / etc []  - 0 Routine Transfer to another Facility (non-emergent condition) []  - 0 Routine Hospital Admission (non-emergent condition) []  - 0 New Admissions / Biomedical engineer / Ordering NPWT, Apligraf, etc. []  - 0 Emergency Hospital Admission (emergent condition) X- 1 10 Simple Discharge Coordination [name], [name] ([id number]) []  - 0 Complex (extensive) Discharge Coordination PROCESS - Special Needs []  - Pediatric / Minor Patient Management 0 []  - 0 Isolation Patient Management []  - 0 Hearing / Language / Visual special needs []  - 0 Assessment of Community assistance (transportation, D/C planning, etc.) []  - 0 Additional assistance / Altered mentation []  - 0 Support Surface(s) Assessment (bed, cushion, seat,  etc.) INTERVENTIONS - Wound Cleansing / Measurement []  - Simple Wound Cleansing - one wound 0 X- 8 5 Complex Wound Cleansing - multiple wounds X- 1 5 Wound Imaging (photographs - any  number of wounds) []  - 0 Wound Tracing (instead of photographs) []  - 0 Simple Wound Measurement - one wound X- 8 5 Complex Wound Measurement - multiple wounds INTERVENTIONS - Wound Dressings []  - Small Wound Dressing one or multiple wounds 0 X- 8 15 Medium Wound Dressing one or multiple wounds []  - 0 Large Wound Dressing one or multiple wounds []  - 0 Application of [name] - topical []  - 0 Application of [name] - injection INTERVENTIONS - Miscellaneous []  - External ear exam 0 []  - 0 Specimen Collection (cultures, biopsies, blood, body fluids, etc.) []  - 0 Specimen(s) / Culture(s) sent or taken to Lab for analysis []  - 0 Patient Transfer (multiple staff / Civil Service fast streamer / Similar devices) []  - 0 Simple Staple / Suture removal (25 or less) []  - 0 Complex Staple / Suture removal (26 or more) []  - 0 Hypo / Hyperglycemic Management (close monitor of Blood Glucose) []  - 0 Ankle / Brachial Index (ABI) - do not check if billed separately X- 1 5 Vital Signs [name], [name] ([id number]) Has the patient been seen at the hospital within the last three years: Yes Total Score: 290 Level Of Care: New/Established - Level 5 Electronic Signature(s) Signed: 01/19/2018 10:40:22 AM By: [name], [name], [name], CWS, [name], [name] Entered By: [name], [name], [name], CWS, Jamie on 01/16/2018 16:09:29 [name], [name] ([id number]) -------------------------------------------------------------------------------- Encounter Discharge Information Details Patient Name: [name]. Date of Service: 01/16/2018 2:45 PM Medical Record Number: [id number] Patient Account Number: [id number] Date of Birth/Sex: Nov 03, 1966 (52 y.o. Female) Treating [name]: [name] Primary Care Claramae Rigdon: [name] Other Clinician: Referring Pollyanna Levay:  [name] Treating Wellington Winegarden/[name]: [name], [name] Weeks in Treatment: 4 Encounter Discharge Information Items Schedule Follow-up Appointment: No Medication Reconciliation completed and No provided to Patient/Care Burnell Hurta: Patient Clinical Summary of Care: Declined Electronic Signature(s) Signed: 01/19/2018 9:00:53 AM By: Ruthine Dose Entered By: Ruthine Dose on 01/16/2018 16:22:41 [name], [name] ([id number]) -------------------------------------------------------------------------------- Lower Extremity Assessment Details Patient Name: [name]. Date of Service: 01/16/2018 2:45 PM Medical Record Number: [id number] Patient Account Number: [id number] Date of Birth/Sex: 06-Aug-1966 (52 y.o. Female) Treating [name]: [name] Primary Care Haizley Cannella: [name] Other Clinician: Referring Kiela Shisler: [name] Treating Jenna Ardoin/[name]: STONE III, [name] Weeks in Treatment: 4 Edema Assessment Assessed: [Left: No] [Right: No] Edema: [Left: No] [Right: No] Vascular Assessment Claudication: Claudication Assessment [Left:None] [Right:None] Pulses: Dorsalis Pedis Palpable: [Left:Yes] [Right:Yes] Posterior Tibial Extremity colors, hair growth, and conditions: Extremity Color: [Left:Pale] [Right:Pale] Temperature of Extremity: [Left:[name]] [Right:[name]] Capillary Refill: [Left:> 3 seconds] [Right:> 3 seconds] Toe Nail Assessment Left: Right: Thick: Yes Yes Discolored: Yes Yes Deformed: Yes No Improper Length and Hygiene: Yes Yes Electronic Signature(s) Signed: 01/16/2018 5:56:09 PM By: [name] Entered By: [name] on 01/16/2018 15:53:03 [name], [name] ([id number]) -------------------------------------------------------------------------------- Multi Wound Chart Details Patient Name: [name]. Date of Service: 01/16/2018 2:45 PM Medical Record Number: [id number] Patient Account Number: [id number] Date of Birth/Sex: [birth year] (52 y.o.  Female) Treating [name]: [name] Primary Care Jazara Swiney: [name] Other Clinician: Referring Montrez Marietta: [name] Treating Jniya Madara/[name]: [name], [name] Weeks in Treatment: 4 Vital Signs Height(in): 67 Pulse(bpm): 91 Weight(lbs): Blood Pressure(mmHg): 77/45 Body Mass Index(BMI): Temperature(F): 98.8 Respiratory Rate 018 (breaths/min): Photos: [10:No Photos] [11:No Photos] [3:No Photos] Wound Location: [10:Left Toe Fifth - Circumfernential] [11:Right Lower Leg - Medial] [3:Right Malleolus - Lateral] Wounding Event: [10:Gradually Appeared] [11:Trauma] [3:Pressure Injury] 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] [11:Trauma, Other] [3:Diabetic Wound/Ulcer of the Lower Extremity]  Comorbid History: [10:Anemia, Arrhythmia, Coronary Artery Disease, Hypotension, Peripheral Arterial Disease, Type II Diabetes, Osteoarthritis] [11:Anemia, Arrhythmia, Coronary Anemia, Arrhythmia, Coronary Artery Disease, Hypotension, Artery Disease,  Hypotension, Peripheral Arterial Disease, Peripheral Arterial Disease, Type II Diabetes, Osteoarthritis] [3:Type II Diabetes, Osteoarthritis] Date Acquired: [10:11/27/2017] [11:01/12/2018] [3:09/23/2016] Weeks of Treatment: [10:2] [11:0] [3:4] Wound Status: [10:Open] [11:Open] [3:Open] Pending Amputation on [10:No] [11:No] [3:No] Presentation: Measurements L x W x D [10:6x2x0.1] [11:1.5x1.2x0.1] [3:0.5x0.8x0.2] (cm) Area (cm) : [10:9.425] [11:1.414] [3:0.314] Volume (cm) : [10:0.942] [11:0.141] [3:0.063] % Reduction in Area: [10:0.00%] [11:0.00%] [3:63.00%] % Reduction in Volume: [10:0.00%] [11:0.00%] [3:62.90%] Classification: [10:Grade 1] [11:Full Thickness Without Exposed Support Structures] [3:Grade 2] Exudate Amount: [10:Medium] [11:Medium] [3:Large] Exudate Type: [10:Serosanguineous] [11:Serosanguineous] [3:Serous] Exudate Color: [10:red, brown] [11:red, brown] [3:amber] Wound Margin: [10:Distinct, outline attached]  [11:Flat and Intact] [3:Distinct, outline attached] Granulation Amount: [10:None Present (0%)] [11:Medium (34-66%)] [3:Large (67-100%)] Granulation Quality: [10:N/A] [11:Red] [3:Pink] Necrotic Amount: [10:Large (67-100%)] [11:Medium (34-66%)] [3:Small (1-33%)] Necrotic Tissue: [10:Eschar] [11:Eschar, Adherent Slough] [3:Adherent Slough] Exposed Structures: [10:Fascia: No Fat Layer (Subcutaneous Tissue) Exposed: No Tendon: No Muscle: No] [11:Fascia: No Fat Layer (Subcutaneous Tissue) Exposed: No Tendon: No Muscle: No] [3:Fascia: No Fat Layer (Subcutaneous Tissue) Exposed: No Tendon: No Muscle: No] Joint: No Joint: No Joint: No Bone: No Bone: No Bone: No Epithelialization: None Small (1-33%) None Periwound Skin Texture: No Abnormalities Noted Excoriation: No Excoriation: No Induration: No Induration: No Callus: No Callus: No Crepitus: No Crepitus: No Rash: No Rash: No Scarring: No Scarring: No Periwound Skin Moisture: No Abnormalities Noted Maceration: No Maceration: No Dry/Scaly: No Dry/Scaly: No Periwound Skin Color: No Abnormalities Noted Atrophie Blanche: No Atrophie Blanche: No Cyanosis: No Cyanosis: No Ecchymosis: No Ecchymosis: No Erythema: No Erythema: No Hemosiderin Staining: No Hemosiderin Staining: No Mottled: No Mottled: No Pallor: No Pallor: No Rubor: No Rubor: No Temperature: No Abnormality No Abnormality No Abnormality Tenderness on Palpation: Yes Yes No Wound Preparation: Ulcer Cleansing: Ulcer Cleansing: Ulcer Cleansing: Rinsed/Irrigated with Saline Rinsed/Irrigated with Saline Rinsed/Irrigated with Saline Topical Anesthetic Applied: Topical Anesthetic Applied: Topical Anesthetic Applied: None Other: lidocaine 4% Other: lidocaine 4% Wound Number: [id number]  Photos: No Photos No Photos No Photos Wound Location: Left, Lateral Calcaneus Left Toe Great - Left Toe Fourth - Circumfernential Circumfernential Wounding Event: Blister Gradually Appeared  Gradually Appeared 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 Lower Extremity Lower Extremity Comorbid History: N/A Anemia, Arrhythmia, Coronary Anemia, Arrhythmia, Coronary Artery Disease, Hypotension, Artery Disease, Hypotension, Peripheral Arterial Disease, Peripheral Arterial Disease, Type II Diabetes, Type II Diabetes, Osteoarthritis Osteoarthritis Date Acquired: 11/27/2017 11/27/2017 11/27/2017 Weeks of Treatment: 4 4 4  Wound Status: Open Open Open Pending Amputation on Yes Yes Yes Presentation: Measurements L x W x D 0.8x1x0.1 2.1x4.2x0.1 1.6x6x0.1 (cm) Area (cm) : 0.628 6.927 7.54 Volume (cm) : 0.063 0.693 0.754 % Reduction in Area: 96.10% 76.90% -60.00% % Reduction in Volume: 98.10% 76.90% -60.10% Classification: Grade 2 Grade 2 Grade 2 Exudate Amount: N/A None Present Large Exudate Type: N/A N/A Serosanguineous Exudate Color: N/A N/A red, brown Wound Margin: N/A Distinct, outline attached Distinct, outline attached Granulation Amount: N/A None Present (0%) None Present (0%) Granulation Quality: N/A N/A N/A Necrotic Amount: N/A Large (67-100%) Large (67-100%) [name], [name] ([id number]) Necrotic Tissue: N/A Eschar Eschar, Adherent Slough Exposed Structures: N/A N/A Fascia: No Fat Layer (Subcutaneous Tissue) Exposed: No Tendon: No Muscle: No Joint: No Bone: No Epithelialization: N/A None None Periwound Skin Texture: No Abnormalities Noted Excoriation: No Excoriation: No  Induration: No Induration: No Callus: No Callus: No Crepitus: No Crepitus: No Rash: No Rash: No Scarring: No Scarring: No Periwound Skin Moisture: No Abnormalities Noted Maceration: No Dry/Scaly: Yes Dry/Scaly: No Maceration: No Periwound Skin Color: No Abnormalities Noted Atrophie Blanche: No Atrophie Blanche: No Cyanosis: No Cyanosis: No Ecchymosis: No Ecchymosis: No Erythema: No Erythema:  No Hemosiderin Staining: No Hemosiderin Staining: No Mottled: No Mottled: No Pallor: No Pallor: No Rubor: No Rubor: No Temperature: N/A No Abnormality No Abnormality Tenderness on Palpation: No No No Wound Preparation: N/A Ulcer Cleansing: Ulcer Cleansing: Rinsed/Irrigated with Saline Rinsed/Irrigated with Saline Topical Anesthetic Applied: Topical Anesthetic Applied: None None Wound Number: [id number] N/A Photos: No Photos No Photos N/A Wound Location: Right Gluteus Left Foot - Lateral N/A Wounding Event: Gradually Appeared Gradually Appeared N/A Primary Etiology: Pressure Ulcer Diabetic Wound/Ulcer of the N/A Lower Extremity Comorbid History: N/A Anemia, Arrhythmia, Coronary N/A Artery Disease, Hypotension, Peripheral Arterial Disease, Type II Diabetes, Osteoarthritis Date Acquired: 09/18/2016 11/27/2017 N/A Weeks of Treatment: 4 4 N/A Wound Status: Open Open N/A Pending Amputation on No Yes N/A Presentation: Measurements L x W x D 1.5x1x0.4 12.5x2.9x0.2 N/A (cm) Area (cm) : 1.178 28.471 N/A Volume (cm) : 0.471 5.694 N/A % Reduction in Area: 21.00% 71.70% N/A % Reduction in Volume: 54.90% 43.40% N/A Classification: Category/Stage IV Grade 2 N/A Exudate Amount: N/A Medium N/A Exudate Type: N/A Serosanguineous N/A [name], [name] ([id number]) Exudate Color: N/A red, brown N/A Wound Margin: N/A Distinct, outline attached N/A Granulation Amount: N/A None Present (0%) N/A Granulation Quality: N/A N/A N/A Necrotic Amount: N/A Large (67-100%) N/A Necrotic Tissue: N/A Eschar N/A Exposed Structures: N/A N/A N/A Epithelialization: N/A None N/A Periwound Skin Texture: No Abnormalities Noted Excoriation: No N/A Induration: No Callus: No Crepitus: No Rash: No Scarring: No Periwound Skin Moisture: No Abnormalities Noted Maceration: No N/A Dry/Scaly: No Periwound Skin Color: No Abnormalities Noted Atrophie Blanche: No N/A Cyanosis: No Ecchymosis: No Erythema:  No Hemosiderin Staining: No Mottled: No Pallor: No Rubor: No Temperature: N/A No Abnormality N/A Tenderness on Palpation: No No N/A Wound Preparation: N/A Ulcer Cleansing: N/A Rinsed/Irrigated with Saline Topical Anesthetic Applied: None Treatment Notes Electronic Signature(s) Signed: 01/19/2018 10:40:22 AM By: [name], [name], [name], CWS, [name], [name] Entered By: [name], [name], [name], CWS, Jamie on 01/16/2018 15:58:15 Hellard, [name] ([id number]) -------------------------------------------------------------------------------- [place] Details Patient Name: KEYLIN, FERRYMAN. Date of Service: 01/16/2018 2:45 PM Medical Record Number: [id number] Patient Account Number: [id number] Date of Birth/Sex: 09-11-1966 (52 y.o. Female) Treating [name]: [name] Primary Care Carlisa Eble: [name] Other Clinician: Referring Nikisha Fleece: [name] Treating Margeaux Swantek/[name]: [name], [name] Weeks in Treatment: 4 Active Inactive ` Abuse / Safety / Falls / Self Care Management Nursing Diagnoses: Potential for falls Goals: Patient will not experience any injury related to falls Date Initiated: 12/18/2017 Target Resolution Date: 02/21/2018 Goal Status: Active Interventions: Assess Activities of Daily Living upon admission and as needed Assess fall risk on admission and as needed Assess: immobility, friction, shearing, incontinence upon admission and as needed Assess impairment of mobility on admission and as needed per policy Assess personal safety and home safety (as indicated) on admission and as needed Assess self care needs on admission and as needed Provide education on basic hygiene Provide education on fall prevention Treatment Activities: Education provided on Basic Hygiene : 12/18/2017 Notes: ` Necrotic Tissue Nursing Diagnoses: Impaired tissue integrity related to necrotic/devitalized tissue Knowledge deficit related to management of necrotic/devitalized  tissue Goals: Patient/caregiver will verbalize understanding of  reason and process for debridement of necrotic tissue Date Initiated: 12/18/2017 Target Resolution Date: 03/28/2018 Goal Status: Active Interventions: Assess patient pain level pre-, during and post procedure and prior to discharge Provide education on necrotic tissue and debridement process Notes: ABCDE, ONEIL ([id number]) ` Nutrition Nursing Diagnoses: Imbalanced nutrition Impaired glucose control: actual or potential Potential for alteratiion in Nutrition/Potential for imbalanced nutrition Goals: Patient/caregiver verbalizes understanding of need to maintain therapeutic glucose control per primary care physician Date Initiated: 12/18/2017 Target Resolution Date: 02/21/2018 Goal Status: Active Interventions: Assess patient nutrition upon admission and as needed per policy Notes: ` Orientation to the Wound Care Program Nursing Diagnoses: Knowledge deficit related to the wound healing center program Goals: Patient/caregiver will verbalize understanding of the [place] Program Date Initiated: 12/18/2017 Target Resolution Date: 12/27/2017 Goal Status: Active Interventions: Provide education on orientation to the wound center Notes: ` Pain, Acute or Chronic Nursing Diagnoses: Pain, acute or chronic: actual or potential Potential alteration in comfort, pain Goals: Patient/caregiver will verbalize adequate pain control between visits Date Initiated: 12/18/2017 Target Resolution Date: 03/28/2018 Goal Status: Active Interventions: Complete pain assessment as per visit requirements Notes: ` Wound/Skin Impairment [name], [name] ([id number]) Nursing Diagnoses: Impaired tissue integrity Knowledge deficit related to ulceration/compromised skin integrity Goals: Ulcer/skin breakdown will have a volume reduction of 80% by week 12 Date Initiated: 12/18/2017 Target Resolution Date: 03/21/2018 Goal Status:  Active Interventions: Assess patient/caregiver ability to perform ulcer/skin care regimen upon admission and as needed Assess ulceration(s) every visit Notes: Electronic Signature(s) Signed: 01/19/2018 10:40:22 AM By: [name], [name], [name], CWS, [name], [name] Entered By: [name], [name], [name], CWS, Jamie on 01/16/2018 15:58:06 [name], [name] ([id number]) -------------------------------------------------------------------------------- Pain Assessment Details Patient Name: MARCH, JOOS. Date of Service: 01/16/2018 2:45 PM Medical Record Number: [id number] Patient Account Number: [id number] Date of Birth/Sex: [birth year] (52 y.o. Female) Treating [name]: [name] Primary Care Nyelle Wolfson: [name] Other Clinician: Referring Detroit Frieden: [name] Treating Malachai Schalk/[name]: STONE III, [name] Weeks in Treatment: 4 Active Problems Location of Pain Severity and Description of Pain Patient Has Paino No Site Locations Duration of the Pain. Constant / Intermittento Constant Rate the pain. Current Pain Level: 10 Character of Pain Describe the Pain: Aching, Burning, Throbbing Pain Management and Medication Current Pain Management: Electronic Signature(s) Signed: 01/16/2018 5:56:09 PM By: [name] Entered By: [name] on 01/16/2018 15:29:32 Wender, Averyanna S. ([id number]) -------------------------------------------------------------------------------- Wound Assessment Details Patient Name: [name]. Date of Service: 01/16/2018 2:45 PM Medical Record Number: [id number] Patient Account Number: [id number] Date of Birth/Sex: December 01, 1965 (52 y.o. Female) Treating [name]: [name] Primary Care Taqwa Deem: [name] Other Clinician: Referring Natina Wiginton: [name] Treating Finnis Colee/[name]: STONE III, [name] Weeks in Treatment: 4 Wound Status Wound Number: 10 Primary Diabetic Wound/Ulcer of the Lower Extremity Etiology: Wound Location: Left Toe Fifth - Circumfernential Wound  Open Wounding Event: Gradually Appeared Status: Date Acquired: 11/27/2017 Comorbid Anemia, Arrhythmia, Coronary Artery Disease, Weeks Of Treatment: 2 History: Hypotension, Peripheral Arterial Disease, Type Clustered Wound: No II Diabetes, Osteoarthritis Photos Photo Uploaded By: [name] on 01/16/2018 17:32:39 Wound Measurements Length: (cm) 6 Width: (cm) 2 Depth: (cm) 0.1 Area: (cm) 9.425 Volume: (cm) 0.942 % Reduction in Area: 0% % Reduction in Volume: 0% Epithelialization: None Tunneling: No Undermining: No Wound Description Classification: Grade 1 Wound Margin: Distinct, outline attached Exudate Amount: Medium Exudate Type: Serosanguineous Exudate Color: red, brown Foul Odor After Cleansing: No Slough/Fibrino Yes Wound Bed Granulation Amount: None Present (0%) Exposed Structure Necrotic Amount: Large (67-100%) Fascia Exposed:  No Necrotic Quality: Eschar Fat Layer (Subcutaneous Tissue) Exposed: No Tendon Exposed: No Muscle Exposed: No Joint Exposed: No Bone Exposed: No Periwound Skin Texture Keirsey, Akaiya S. ([id number]) Texture Color No Abnormalities Noted: No No Abnormalities Noted: No Moisture Temperature / Pain No Abnormalities Noted: No Temperature: No Abnormality Tenderness on Palpation: Yes Wound Preparation Ulcer Cleansing: Rinsed/Irrigated with Saline Topical Anesthetic Applied: None Electronic Signature(s) Signed: 01/16/2018 5:56:09 PM By: [name] Entered By: [name] on 01/16/2018 15:53:39 Bloch, Davinia S. ([id number]) -------------------------------------------------------------------------------- Wound Assessment Details Patient Name: [name]. Date of Service: 01/16/2018 2:45 PM Medical Record Number: [id number] Patient Account Number: [id number] Date of Birth/Sex: 1966-07-31 (52 y.o. Female) Treating [name]: [name] Primary Care Izaac Reisig: [name] Other Clinician: Referring Rosalee Tolley: [name] Treating Paz Fuentes/[name]: STONE III, [name] Weeks in Treatment: 4 Wound Status Wound Number: 11 Primary Trauma, Other Etiology: Wound Location: Right Lower Leg - Medial Wound Open Wounding Event: Trauma Status: Date Acquired: 01/12/2018 Comorbid Anemia, Arrhythmia, Coronary Artery Disease, Weeks Of Treatment: 0 History: Hypotension, Peripheral Arterial Disease, Type Clustered Wound: No II Diabetes, Osteoarthritis Photos Photo Uploaded By: [name] on 01/16/2018 17:33:16 Wound Measurements Length: (cm) 1.5 Width: (cm) 1.2 Depth: (cm) 0.1 Area: (cm) 1.414 Volume: (cm) 0.141 % Reduction in Area: 0% % Reduction in Volume: 0% Epithelialization: Small (1-33%) Tunneling: No Undermining: No Wound Description Full Thickness Without Exposed Support Classification: Structures Wound Margin: Flat and Intact Exudate Medium Amount: Exudate Type: Serosanguineous Exudate Color: red, brown Foul Odor After Cleansing: No Slough/Fibrino Yes Wound Bed Granulation Amount: Medium (34-66%) Exposed Structure Granulation Quality: Red Fascia Exposed: No Necrotic Amount: Medium (34-66%) Fat Layer (Subcutaneous Tissue) Exposed: No Necrotic Quality: Eschar, Adherent Slough Tendon Exposed: No Muscle Exposed: No Joint Exposed: No Bone Exposed: No Heikes, Yoshika S. ([id number]) Periwound Skin Texture Texture Color No Abnormalities Noted: No No Abnormalities Noted: No Callus: No Atrophie Blanche: No Crepitus: No Cyanosis: No Excoriation: No Ecchymosis: No Induration: No Erythema: No Rash: No Hemosiderin Staining: No Scarring: No Mottled: No Pallor: No Moisture Rubor: No No Abnormalities Noted: No Dry / Scaly: No Temperature / Pain Maceration: No Temperature: No Abnormality Tenderness on Palpation: Yes Wound Preparation Ulcer Cleansing: Rinsed/Irrigated with Saline Topical Anesthetic Applied: Other: lidocaine 4%, Electronic Signature(s) Signed: 01/16/2018  5:56:09 PM By: [name] Entered By: [name] on 01/16/2018 15:53:51 Imber, Lindee S. ([id number]) -------------------------------------------------------------------------------- Wound Assessment Details Patient Name: [name]. Date of Service: 01/16/2018 2:45 PM Medical Record Number: [id number] Patient Account Number: [id number] Date of Birth/Sex: 1966-01-13 (52 y.o. Female) Treating [name]: [name] Primary Care Nida Manfredi: [name] Other Clinician: Referring Grabiel Schmutz: [name] Treating Hisae Decoursey/[name]: STONE III, [name] Weeks in Treatment: 4 Wound Status Wound Number: 3 Primary Diabetic Wound/Ulcer of the Lower Extremity Etiology: Wound Location: Right Malleolus - Lateral Wound Open Wounding Event: Pressure Injury Status: Date Acquired: 09/23/2016 Comorbid Anemia, Arrhythmia, Coronary Artery Disease, Weeks Of Treatment: 4 History: Hypotension, Peripheral Arterial Disease, Type Clustered Wound: No II Diabetes, Osteoarthritis Photos Photo Uploaded By: [name] on 01/16/2018 17:33:51 Wound Measurements Length: (cm) 0.5 Width: (cm) 0.8 Depth: (cm) 0.2 Area: (cm) 0.314 Volume: (cm) 0.063 % Reduction in Area: 63% % Reduction in Volume: 62.9% Epithelialization: None Wound Description Classification: Grade 2 Wound Margin: Distinct, outline attached Exudate Amount: Large Exudate Type: Serous Exudate Color: amber Foul Odor After Cleansing: No Slough/Fibrino Yes Wound Bed Granulation Amount: Large (67-100%) Exposed Structure Granulation Quality: Pink Fascia Exposed: No Necrotic Amount: Small (1-33%) Fat Layer (Subcutaneous Tissue) Exposed: No Necrotic Quality: Adherent  Slough Tendon Exposed: No Muscle Exposed: No Joint Exposed: No Bone Exposed: No Periwound Skin Texture Lewan, Carys S. ([id number]) Texture Color No Abnormalities Noted: No No Abnormalities Noted: No Callus: No Atrophie Blanche: No Crepitus: No Cyanosis:  No Excoriation: No Ecchymosis: No Induration: No Erythema: No Rash: No Hemosiderin Staining: No Scarring: No Mottled: No Pallor: No Moisture Rubor: No No Abnormalities Noted: No Dry / Scaly: No Temperature / Pain Maceration: No Temperature: No Abnormality Wound Preparation Ulcer Cleansing: Rinsed/Irrigated with Saline Topical Anesthetic Applied: Other: lidocaine 4%, Electronic Signature(s) Signed: 01/16/2018 5:56:09 PM By: [name] Entered By: [name] on 01/16/2018 15:54:12 Varone, Buckley. ([id number]) -------------------------------------------------------------------------------- Wound Assessment Details Patient Name: [name]. Date of Service: 01/16/2018 2:45 PM Medical Record Number: [id number] Patient Account Number: [id number] Date of Birth/Sex: 13-Jan-1966 (52 y.o. Female) Treating [name]: [name] Primary Care Quentin Shorey: [name] Other Clinician: Referring Milah Recht: [name] Treating Mirranda Monrroy/[name]: STONE III, [name] Weeks in Treatment: 4 Wound Status Wound Number: 4 Primary Diabetic Wound/Ulcer of the Lower Etiology: Extremity Wound Location: Left, Lateral Calcaneus Wound Status: Open Wounding Event: Blister Date Acquired: 11/27/2017 Weeks Of Treatment: 4 Clustered Wound: No Pending Amputation On Presentation Photos Photo Uploaded By: [name] on 01/16/2018 17:35:58 Wound Measurements Length: (cm) 0.8 Width: (cm) 1 Depth: (cm) 0.1 Area: (cm) 0.628 Volume: (cm) 0.063 % Reduction in Area: 96.1% % Reduction in Volume: 98.1% Wound Description Classification: Grade 2 Periwound Skin Texture Texture Color No Abnormalities Noted: No No Abnormalities Noted: No Moisture No Abnormalities Noted: No Electronic Signature(s) Signed: 01/16/2018 5:56:09 PM By: [name] Entered By: [name] on 01/16/2018 15:47:40 Dibartolo, Makella S.  ([id number]) -------------------------------------------------------------------------------- Wound Assessment Details Patient Name: [name]. Date of Service: 01/16/2018 2:45 PM Medical Record Number: [id number] Patient Account Number: [id number] Date of Birth/Sex: Apr 25, 1966 (52 y.o. Female) Treating [name]: [name] Primary Care Ismar Yabut: [name] Other Clinician: Referring Mallary Kreger: [name] Treating Ferrel Simington/[name]: STONE III, [name] Weeks in Treatment: 4 Wound Status Wound Number: 5 Primary Diabetic Wound/Ulcer of the Lower Extremity Etiology: Wound Location: Left Toe Great - Circumfernential Wound Open Wounding Event: Gradually Appeared Status: Date Acquired: 11/27/2017 Comorbid Anemia, Arrhythmia, Coronary Artery Disease, Weeks Of Treatment: 4 History: Hypotension, Peripheral Arterial Disease, Type Clustered Wound: No II Diabetes, Osteoarthritis Pending Amputation On Presentation Photos Photo Uploaded By: [name] on 01/16/2018 17:36:25 Wound Measurements Length: (cm) 2.1 Width: (cm) 4.2 Depth: (cm) 0.1 Area: (cm) 6.927 Volume: (cm) 0.693 % Reduction in Area: 76.9% % Reduction in Volume: 76.9% Epithelialization: None Tunneling: No Undermining: No Wound Description Classification: Grade 2 Wound Margin: Distinct, outline attached Exudate Amount: None Present Foul Odor After Cleansing: No Slough/Fibrino Yes Wound Bed Granulation Amount: None Present (0%) Necrotic Amount: Large (67-100%) Necrotic Quality: Eschar Periwound Skin Texture Texture Color No Abnormalities Noted: No No Abnormalities Noted: No Callus: No Atrophie Blanche: No Crepitus: No Cyanosis: No Excoriation: No Ecchymosis: No Heitmeyer, Ruben S. ([id number]) Induration: No Erythema: No Rash: No Hemosiderin Staining: No Scarring: No Mottled: No Pallor: No Moisture Rubor: No No Abnormalities Noted: No Dry / Scaly: No Temperature / Pain Maceration:  No Temperature: No Abnormality Wound Preparation Ulcer Cleansing: Rinsed/Irrigated with Saline Topical Anesthetic Applied: None Electronic Signature(s) Signed: 01/16/2018 5:56:09 PM By: [name] Entered By: [name] on 01/16/2018 15:54:32 Stege, Debraann S. ([id number]) -------------------------------------------------------------------------------- Wound Assessment Details Patient Name: [name]. Date of Service: 01/16/2018 2:45 PM Medical Record Number: [id number] Patient Account Number: [id number] Date of Birth/Sex: May 21, 1966 (52 y.o. Female) Treating [name]: [name]  Primary Care Pauleen Goleman: [name] Other Clinician: Referring Shirley Decamp: [name] Treating Chassity Ludke/[name]: STONE III, [name] Weeks in Treatment: 4 Wound Status Wound Number: 6 Primary Diabetic Wound/Ulcer of the Lower Extremity Etiology: Wound Location: Left Toe Fourth - Circumfernential Wound Open Wounding Event: Gradually Appeared Status: Date Acquired: 11/27/2017 Comorbid Anemia, Arrhythmia, Coronary Artery Disease, Weeks Of Treatment: 4 History: Hypotension, Peripheral Arterial Disease, Type Clustered Wound: No II Diabetes, Osteoarthritis Pending Amputation On Presentation Photos Photo Uploaded By: [name] on 01/16/2018 17:37:13 Wound Measurements Length: (cm) 1.6 Width: (cm) 6 Depth: (cm) 0.1 Area: (cm) 7.54 Volume: (cm) 0.754 % Reduction in Area: -60% % Reduction in Volume: -60.1% Epithelialization: None Tunneling: No Undermining: No Wound Description Classification: Grade 2 Wound Margin: Distinct, outline attached Exudate Amount: Large Exudate Type: Serosanguineous Exudate Color: red, brown Foul Odor After Cleansing: No Slough/Fibrino Yes Wound Bed Granulation Amount: None Present (0%) Exposed Structure Necrotic Amount: Large (67-100%) Fascia Exposed: No Necrotic Quality: Eschar, Adherent Slough Fat Layer (Subcutaneous Tissue) Exposed: No Tendon  Exposed: No Muscle Exposed: No Joint Exposed: No Bone Exposed: No Periwound Skin Texture Weltz, Jamilet S. ([id number]) Texture Color No Abnormalities Noted: No No Abnormalities Noted: No Callus: No Atrophie Blanche: No Crepitus: No Cyanosis: No Excoriation: No Ecchymosis: No Induration: No Erythema: No Rash: No Hemosiderin Staining: No Scarring: No Mottled: No Pallor: No Moisture Rubor: No No Abnormalities Noted: No Dry / Scaly: Yes Temperature / Pain Maceration: No Temperature: No Abnormality Wound Preparation Ulcer Cleansing: Rinsed/Irrigated with Saline Topical Anesthetic Applied: None Electronic Signature(s) Signed: 01/16/2018 5:56:09 PM By: [name] Entered By: [name] on 01/16/2018 15:54:52 Kutscher, Katherin S. ([id number]) -------------------------------------------------------------------------------- Wound Assessment Details Patient Name: [name]. Date of Service: 01/16/2018 2:45 PM Medical Record Number: [id number] Patient Account Number: [id number] Date of Birth/Sex: Dec 13, 1965 (52 y.o. Female) Treating [name]: [name] Primary Care Julann Mcgilvray: [name] Other Clinician: Referring Brinnley Lacap: [name] Treating Sarae Nicholes/[name]: STONE III, [name] Weeks in Treatment: 4 Wound Status Wound Number: 7 Primary Pressure Ulcer Etiology: Wound Location: Right Gluteus Wound Open Wounding Event: Gradually Appeared Status: Date Acquired: 09/18/2016 Comorbid Anemia, Arrhythmia, Coronary Artery Disease, Weeks Of Treatment: 4 History: Hypotension, Peripheral Arterial Disease, Type Clustered Wound: No II Diabetes, Osteoarthritis Photos Photo Uploaded By: [name] on 01/16/2018 17:38:04 Wound Measurements Length: (cm) 1.5 Width: (cm) 1 Depth: (cm) 0.4 Area: (cm) 1.178 Volume: (cm) 0.471 % Reduction in Area: 21% % Reduction in Volume: 54.9% Epithelialization: None Tunneling: No Undermining: No Wound  Description Classification: Category/Stage IV Wound Margin: Distinct, outline attached Exudate Amount: Large Exudate Type: Serosanguineous Exudate Color: red, brown Foul Odor After Cleansing: No Slough/Fibrino Yes Wound Bed Granulation Amount: Large (67-100%) Granulation Quality: Red Necrotic Amount: Small (1-33%) Necrotic Quality: Adherent Slough Periwound Skin Texture Texture Color No Abnormalities Noted: No No Abnormalities Noted: No Callus: No Atrophie Blanche: No Vaquerano, Primrose S. ([id number]) Crepitus: No Cyanosis: No Excoriation: No Ecchymosis: No Induration: No Erythema: No Rash: No Hemosiderin Staining: No Scarring: Yes Mottled: No Pallor: No Moisture Rubor: No No Abnormalities Noted: No Dry / Scaly: No Temperature / Pain Maceration: No Temperature: No Abnormality Tenderness on Palpation: Yes Wound Preparation Ulcer Cleansing: Rinsed/Irrigated with Saline Topical Anesthetic Applied: Other: lidocaine, Electronic Signature(s) Signed: 01/16/2018 4:36:24 PM By: [name] Entered By: [name] on 01/16/2018 16:36:23 Mcmahill, Stephinie Chauncey Cruel ([id number]) -------------------------------------------------------------------------------- Wound Assessment Details Patient Name: [name]. Date of Service: 01/16/2018 2:45 PM Medical Record Number: [id number] Patient Account Number: [id number] Date of Birth/Sex: February 03, 1966 (52 y.o. Female) Treating [name]: [name]  Primary Care Rhaya Coale: [name] Other Clinician: Referring Latina Frank: [name] Treating Wing Gfeller/[name]: STONE III, [name] Weeks in Treatment: 4 Wound Status Wound Number: 8 Primary Diabetic Wound/Ulcer of the Lower Extremity Etiology: Wound Location: Left Foot - Lateral Wound Open Wounding Event: Gradually Appeared Status: Date Acquired: 11/27/2017 Comorbid Anemia, Arrhythmia, Coronary Artery Disease, Weeks Of Treatment: 4 History: Hypotension, Peripheral Arterial Disease,  Type Clustered Wound: No II Diabetes, Osteoarthritis Pending Amputation On Presentation Photos Photo Uploaded By: [name] on 01/16/2018 17:38:33 Wound Measurements Length: (cm) 12.5 Width: (cm) 2.9 Depth: (cm) 0.2 Area: (cm) 28.471 Volume: (cm) 5.694 % Reduction in Area: 71.7% % Reduction in Volume: 43.4% Epithelialization: None Tunneling: No Undermining: No Wound Description Classification: Grade 2 Wound Margin: Distinct, outline attached Exudate Amount: Medium Exudate Type: Serosanguineous Exudate Color: red, brown Foul Odor After Cleansing: No Slough/Fibrino Yes Wound Bed Granulation Amount: None Present (0%) Necrotic Amount: Large (67-100%) Necrotic Quality: Eschar Periwound Skin Texture Texture Color No Abnormalities Noted: No No Abnormalities Noted: No Callus: No Atrophie Blanche: No Delmore, Makilah S. ([id number]) Crepitus: No Cyanosis: No Excoriation: No Ecchymosis: No Induration: No Erythema: No Rash: No Hemosiderin Staining: No Scarring: No Mottled: No Pallor: No Moisture Rubor: No No Abnormalities Noted: No Dry / Scaly: No Temperature / Pain Maceration: No Temperature: No Abnormality Wound Preparation Ulcer Cleansing: Rinsed/Irrigated with Saline Topical Anesthetic Applied: None Electronic Signature(s) Signed: 01/16/2018 4:36:08 PM By: [name] Entered By: [name] on 01/16/2018 16:36:08 Muldrow, Vadito. ([id number]) -------------------------------------------------------------------------------- Vitals Details Patient Name: [name]. Date of Service: 01/16/2018 2:45 PM Medical Record Number: [id number] Patient Account Number: [id number] Date of Birth/Sex: Jan 30, 1966 (52 y.o. Female) Treating [name]: [name] Primary Care Stefannie Defeo: [name] Other Clinician: Referring Ceilidh Torregrossa: [name] Treating Ree Alcalde/[name]: STONE III, [name] Weeks in Treatment: 4 Vital Signs Time Taken: 03:29 Temperature  (F): 98.8 Height (in): 67 Pulse (bpm): 91 Respiratory Rate (breaths/min): 018 Blood Pressure (mmHg): 77/45 Reference Range: 80 - 120 mg / dl Electronic Signature(s) Signed: 01/16/2018 5:56:09 PM By: [name] Entered By: [name] on 01/16/2018 15:29:53

## 2018-01-19 NOTE — Progress Notes (Addendum)
[name], [name] ([id number]) Visit Report for 01/16/2018 Chief Complaint Document Details Patient Name: [name], [name]. Date of Service: 01/16/2018 2:45 PM Medical Record Number: [id number] Patient Account Number: [id number] Date of Birth/Sex: Nov 01, 1966 (52 y.o. Female) Treating RN: Cornell Barman Primary Care Provider: Delight Stare Other Clinician: Referring Provider: Delight Stare Treating Provider/Extender: Melburn Hake, Shadrick Senne Weeks in Treatment: 4 Information Obtained from: Patient Chief Complaint She is here in follow up evaluation for multiple wounds Electronic Signature(s) Signed: 01/17/2018 10:02:55 PM By: Worthy Keeler PA-C Entered By: Worthy Keeler on 01/16/2018 14:50:26 [name], [name] ([id number]) -------------------------------------------------------------------------------- HPI Details Patient Name: Jamie Martyr. Date of Service: 01/16/2018 2:45 PM Medical Record Number: [id number] Patient Account Number: [id number] Date of Birth/Sex: [birth year] (52 y.o. Female) Treating RN: Cornell Barman Primary Care Provider: Delight Stare Other Clinician: Referring Provider: Delight Stare Treating Provider/Extender: Melburn Hake, Casaundra Takacs Weeks in Treatment: 4 History of Present Illness HPI Description: 52 year old patient was sent to [place] from [facility] where she was seen by Dr. Sherril Cong for a left ankle ulceration and was recently hospitalized with hypotension and sepsis. She was treated with IV antibiotics and has been scheduled to see Dr. Sharol Given. She was seen by vascular surgery who recommended a femoral bypass but surgery has been delayed until her sacral wound from last year's necrotizing fasciitis has healed. Was seeing the wound care team at [facility] but wanted to change over. She is a smoker and smokes a pack of cigarettes a day The patient was recently admitted in [place] between February 2 and February 14. She had a follow-up to see vascular surgery, orthopedic surgery and  infectious disease. 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 Review of the electronic medical records reveals that Dr. 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 An MRI showed edema in the bone which was consistent with osteomyelitis and the chronic ulcer and may need surgical intervention. She also had a sacral decubitus ulcer which was treated with the wound VAC. On 01/07/2017 she was taken up by the vascular surgeon Dr. Trula Slade for an abdominal aortogram and bilateral lower extremity runoff, for a history of having bilateral femoropopliteal bypass graft as well as external iliac stenting on the right and stenting of her bypass graft. She had developed a nonhealing wound on her right ankle and there was a possibility of a femoral occlusion. the findings were noted and the impression was a surgical revascularization with a aorto bifemoral bypass graft. Both the femoropopliteal bypass grafts were patent. 01/31/2017 -- x-ray of the right hip and pelvis -- IMPRESSION:No radiographic evidence of acute osteomyelitis. Normal-appearing right hip joint space for age. No acute bony abnormality of the hip. 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 Since seeing her last week she has not had an appointment with infectious disease or the orthopedic specialist yet. 02/06/2017 -- the patient missed a couple of appointments yesterday due to the weather but other than that has apparently given up smoking for the last 5  days. 02/13/2017 -- she has rescheduled her infectious disease  appointment and also the appointment with the orthopedic surgeon Dr. Sharol Given 03/06/2017 -- she was seen by Dr. Lucianne Lei dam of infectious disease on 03/05/2017 and he recommendedIV antibiotics but the patient did not want to have that and he has given her Augmentin and doxycycline and will reevaluate her in 2 months time. 03/13/2017 -- he saw Dr. Trula Slade regarding her vascular issues and he would like to wait to the sacral wound is completely closed before considering aortobifemoral bypass graft. He will see her back in 2 months time. She was also seen by Dr. Sharol Given of orthopedics who recommended continue wound care dressings and follow in the office in about 2 months time. he also recommended 3 view radiographs of the right ankle at follow-up. 03/28/2017 -- she did have a PICC line placed and Dr. Lucianne Lei dam has begun IV vancomycin and ceftriaxone. she is going to continue this until she sees him in approximately a month's time 04/18/2017 -- we had applied for a skin substitute for the patient's care but her copayment is going to be about $300 a piece and the patient will not be able to afford this. 05/08/17 on evaluation today patient appears to potentially have a fungal rash in the periwound region of the sacral area. This also seems to extend into the inguinal creases and factional region as well. She is tender to palpation with light rubbing over the region of the periwound and the skin in this region does have a beefy red appearance. Everything seems to point to a fungal infection at this time. She has been tolerating the dressing changes up to this point. There is no evidence of infection otherwise. 05/16/2017 -- was seen by Dr. Rhina Brackett dam of infectious disease -- he saw for the chronic wound over her right ankle Brindle, [place]. ([id number]) with osteomyelitis of the fibula. He did not think that she is making progress and was not able  to examine her sacral decubitus ulcer as the patient would not allow it. She was switched to Bactrim DS 2 tablets twice a day since finishing her antibiotics. Her ESR was 76 and a CRP was 2.8. 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 05/23/17 on evaluation today patient's wound appears to be doing about the same in regard to the ankle as well as the gluteal area. She apparently has issues with the one back staying in place and she states the sill seems to break up the inferior aspect. She does have home help coming out to apply the wound VAC. She has been tolerating the dressing changes without any problem although she has had some increased pain in the gluteal region due to a fall she sustained and she feels like she brews that area and she had pain actually radiating down her leg which is slowly beginning to improve. There's no evidence of infection. 05/29/17 Unfortunately on evaluation today patient's wounds both in regard to the ankle as well as in regard to her gluteal region on the right appear to be measuring the same as last week's evaluation. She also tells me that she is having soreness today in regard to the right gluteal area because she had a visitor who came to see her a couple days ago and stayed for six hours and she had to set up for that entire length of time visiting. This has called some increased discomfort. Normally she does not  set up for those long periods of time. Fortunately there's no evidence of infection and no worsening of the wounds. 06/13/2017 -- the patient has had a lot of health issues including perfuse diarrhea and generalized weakness and I have instructed her to see her PCP as soon as possible and if things get worse to go to the ER. She has not brought her wound VAC today. She continues to be on oral antibiotics 06/30/17- she is here in follow up evaluation  for right ischial and right lateral malleolus ulcer. She continues with negative pressure wound therapy to the ischial wound. She is currently on a diabetic therapy per infectious disease, although she does state that she inconsistently takes it. She was advised to take her antibiotic as prescribed 07/11/2017 -- she has been unable to use a wound VAC regularly because she loses this evening and it has not been possible for her to use it. 07/18/2017 -- she is much more comfortable and looks better without the wound VAC and at this stage I believe it will be better for her to return the machine and continue with local care. 08/07/2017 -- she is back up to 3 weeks due to a vacation and during this time her wound VAC was returned. She has been doing local dressings 08/15/17 on evaluation today patient appears to be doing about the same in regard to her ankle as well as right gluteal wound. She has been tolerating the dressing changes and tells me that it's mainly her gluteal wound that hurts the ankle is nontender to palpation. She rates this pain to be a one out of 10 at rest and with cleansing of the wound five out of 10 at least. No fevers, chills, nausea, or vomiting noted at this time. She does note that home health has told her to inquire about possibly using silver nitrate on the wound edges due to the road nature of the edges. 08/29/2017 -- -- I got a note which Dr. Lucianne Lei dam sent to Dr. 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 He had recommended definitive surgery to fix this. He recommended to continue with Bactrim Right ankle x-ray -- IMPRESSION: Findings compatible with cellulitis of the lateral malleolar region. A small focus of bony destruction involving the tip of the lateral malleolus is worrisome for osteomyelitis. 09/05/2017 -- the patient was seen by Dr. Meridee Score yesterday who thought her wound looked  good and he would continue local care with silver alginate dressing and follow-up in 4 weeks. He did not recommend any surgical intervention of the ankle until there is good arterial flow and he would await Dr. Stephens Shire opinion regarding this. She was also seen by infectious disease Dr. Lucianne Lei dam, who was concerned about her elevated potassium and took her off Bactrim. he has recommended doxycycline. 09/26/2017 -- she is back after 3 weeks as she was out of town on vacation. She has put on some weight but other than that no change in her health 10/24/2017 -- she was seen by Dr. [name] -- after a thorough review he plans a angiographically via the left femoral approach to evaluate her left subclavian artery and address any stenosis at that time. He will also perform bilateral runoff to confirm that a right femoropopliteal as well as a left femoropopliteal bypass graft remained patent. He is then considering a right axillary to femoral bypass graft for limb salvage. ADALEEN, [name] ([id number]) ==== 12/18/17-she  is here in follow-up evaluation for multiple wounds. She is s/p aortobifemoral and left fem-pop revision. She presents with multiple necrotic areas to the left toes and lateral foot. The wounds we were originally seen her for (right heel and left buttock) are improved/stable. According to her husband the wounds to the left foot are "better" than they were upon discharge from the hospital last Tuesday. He states that Dr. Trula Slade saw her on same day of discharge. She has a follow-up with Dr. Trula Slade on Monday. There are dopplerable pulses to the left dorsalis pedis, posterior tibial, peroneal. We communicated with Dr. Stephens Shire nurse regarding today's findings, photos were sent to her email and the patient will maintain her appointment on Monday. We discussed with the patient and her husband to report to [facility] in [place] for any concerns, changes, new areas of necrosis, new  areas or progressive ischemia/purple discoloration, pallor, new/increased pain. We will anticipate follow up next week 12/30/17 patient unfortunately continues to have multiple wound of her lower extremities and specifically her left toes and lateral foot. She does have regular follow-up visits with Dr. Trula Slade who performed her surgery and at this point they are just holding to wait and see what needs to be done in regard to her foot such as what's going to heal and what may need to have potential amputation of further surgery. For this reason we are just trying to maintain and see were things end up at this point. Fortunately patient is not having any significant discomfort. The right lateral malleolus appears to be doing about the same there is some granulation tissue she does have osteomyelitis and not ulcer. 01/06/18 patient presents today for fault evaluation concerning her ongoing issues with her right lateral ankle, right gluteal region, and left lower extremity. Currently regard to left lower extremity it's possible that she may require some amputation although patient states she has the appointment follow-up with Dr. Trula Slade next week. That's when she will find out what he thinks needs to be done at that point. Nonetheless in regard to the right lateral ankle this appears to be doing fairly well today. Her right gluteal region actually appears to be doing fairly well with a good wound surface any slough that was present easily cleaned off with saline and gauze. 01/16/18 on evaluation today patient appears to be doing much better actually in regard to her left foot necrotic areas. A lot of this is starting to liftoff as far as the eschar is concerned and there is a lot of healing underneath which is excellent news. I'm actually pleasantly pleased with the results that she's getting in this regard. Fortunately there does not appear to be any evidence of infection which is great news. Overall she  has been tolerating the dressing changes without complication she does have one new ulcer on the right anterior shin due to a small skin tear which is actually healed but then the adhesive from a Band-Aid that was put on it at a doctor's office actually has 20 skin and she says while he has a small ulcer due to that. Fortunately this is superficial. Electronic Signature(s) Signed: 01/17/2018 10:02:55 PM By: Worthy Keeler PA-C Entered By: Worthy Keeler on 01/16/2018 16:10:49 [name], [name] ([id number]) -------------------------------------------------------------------------------- Physical Exam Details Patient Name: GEANA, WALTS S. Date of Service: 01/16/2018 2:45 PM Medical Record Number: [id number] Patient Account Number: [id number] Date of Birth/Sex: November 02, 1966 (52 y.o. Female) Treating RN: Cornell Barman Primary Care Provider: Delight Stare Other  Clinician: Referring Provider: Armistead, LINDA Treating Provider/Extender: STONE III, Acea Yagi Weeks in Treatment: 4 Constitutional Well-nourished and well-hydrated in no acute distress. Respiratory normal breathing without difficulty. Psychiatric this patient is able to make decisions and demonstrates good insight into disease process. Alert and Oriented x 3. pleasant and cooperative. Notes Patient's wounds appear to be fairly stable she is good granulation in regard to the sacral region as was the right lateral malleolus. She does have a lot of the eschar starting to come off in regard to the left foot and underneath there is a lot of healing tissue. Obviously I'm definitely pleased in this regard. She sees Dr. Trula Slade next week. I did selectively debride away some of the necrotic eschar that was coming loose so that it would not get pulled in some school he injured her going forward. Electronic Signature(s) Signed: 01/17/2018 10:02:55 PM By: Worthy Keeler PA-C Entered By: Worthy Keeler on 01/16/2018 16:11:46 [name], [name]  ([id number]) -------------------------------------------------------------------------------- Physician Orders Details Patient Name: ARITA, SEVERTSON. Date of Service: 01/16/2018 2:45 PM Medical Record Number: [id number] Patient Account Number: [id number] Date of Birth/Sex: [birth year] (52 y.o. 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 Wound #11 Right,Medial Lower Leg o Clean wound with Normal Saline. Wound #3 Right,Lateral Malleolus o Clean wound with Normal Saline. Wound #4 Left,Lateral Calcaneus o Clean wound with Normal Saline. Wound #5 Left,Circumferential Toe Great o Clean wound with Normal Saline. Wound #6 Left,Circumferential Toe Fourth o Clean wound with Normal Saline. Wound #7 Right Gluteus o Clean wound with Normal Saline. Wound #8 Left,Lateral Foot o Clean wound with Normal Saline. Anesthetic (add to Medication List) Wound #11 Right,Medial Lower Leg o Topical Lidocaine 4% cream applied to wound bed prior to debridement (In Clinic Only). Wound #4 Left,Lateral Calcaneus o Topical Lidocaine 4% cream applied to wound bed prior to debridement (In Clinic Only). Wound #7 Right Gluteus o Topical Lidocaine 4% cream applied to wound bed prior to debridement (In Clinic Only). ALEXSYS, ESKIN ([id number]) Skin Barriers/Peri-Wound Care Wound #10 Left,Circumferential Toe Fifth o Skin Prep Wound #11 Right,Medial Lower Leg o Skin Prep Wound #3 Right,Lateral Malleolus o Skin Prep Wound #4 Left,Lateral Calcaneus o Skin Prep Wound #5 Left,Circumferential Toe Great o Skin Prep Wound #6 Left,Circumferential Toe Fourth o Skin Prep Wound #7 Right Gluteus o Skin Prep Wound #8 Left,Lateral Foot o Skin Prep Primary Wound Dressing Wound #10 Left,Circumferential Toe Fifth o Other: - betadine or providone-iodine (paint on wound) before you place on silver alginate Wound #11 Right,Medial Lower Leg o Prisma Ag Wound #3 Right,Lateral Malleolus o Prisma Ag - moisten with saline Wound #4 Left,Lateral Calcaneus o Other: - betadine or providone-iodine (paint on wound) before you place on silver alginate Wound #5 Left,Circumferential Toe Great o Other: - betadine or providone-iodine (paint on wound) before you place on silver alginate Wound #6 Left,Circumferential Toe Fourth o Other: - betadine or providone-iodine (paint on wound) before you place on  silver alginate Wound #7 Right Gluteus o Prisma Ag Wound #8 Left,Lateral Foot o Other: - betadine or providone-iodine (paint on wound) before you place on silver alginate Secondary Dressing Wound #10 Left,Circumferential Toe Fifth o ABD pad o Conform/Kerlix Wound #11 Right,Medial Lower Leg o ABD pad Rama, Rebekka S. ([id number]) o Conform/Kerlix Wound #3 Right,Lateral Malleolus o Boardered Foam Dressing Wound #4 Left,Lateral Calcaneus o ABD pad o Conform/Kerlix Wound #5 Left,Circumferential Toe Great o ABD pad o Conform/Kerlix Wound #6 Left,Circumferential Toe Fourth o ABD pad o Conform/Kerlix Wound #7 Right Gluteus o Boardered Foam Dressing Wound #8 Left,Lateral Foot o ABD pad o Conform/Kerlix Dressing Change Frequency Wound #10 Left,Circumferential Toe  Fifth o Change dressing every other day. o Other: - PRN Wound #11 Right,Medial Lower Leg o Change dressing every other day. o Other: - PRN Wound #3 Right,Lateral Malleolus o Change dressing every other day. o Other: - PRN Wound #4 Left,Lateral Calcaneus o Change dressing every other day. o Other: - PRN Wound #5 Left,Circumferential Toe Great o Change dressing every other day. o Other: - PRN Wound #6 Left,Circumferential Toe Fourth o Change dressing every other day. o Other: - PRN Wound #7 Right Gluteus o Change dressing every other day. o Other: - PRN Wound #8 Left,Lateral Foot o Change dressing every other day. o Other: - PRN EMBERLEIGH, REILY ([id number]) Follow-up Appointments Wound #10 Left,Circumferential Toe Fifth o Return Appointment in 1 week. Wound #11 Right,Medial Lower Leg o Return Appointment in 1 week. Wound #3 Right,Lateral Malleolus o Return Appointment in 1 week. Wound #4 Left,Lateral Calcaneus o Return Appointment in 1 week. Wound #5 Left,Circumferential Toe Great o Return Appointment in 1 week. Wound #6 Left,Circumferential Toe Fourth o Return Appointment in 1 week. Wound #7 Right Gluteus o Return Appointment in 1 week. Wound #8 Left,Lateral Foot o Return Appointment in 1 week. Off-Loading Wound #7 Right Gluteus o Turn and reposition every 2 hours Additional Orders / Instructions Wound #10 Left,Circumferential Toe Fifth o Increase protein intake. Wound #11 Right,Medial Lower Leg o Increase protein intake. Wound #3 Right,Lateral Malleolus o Increase protein intake. Wound #4 Left,Lateral Calcaneus o Increase protein intake. Wound #5 Left,Circumferential Toe Great o Increase protein intake. Wound #6 Left,Circumferential Toe Fourth o Increase protein intake. Wound #7 Right Gluteus o Increase protein intake. Wound #8 Left,Lateral Foot o Increase protein intake. Home Health Wound  #10 Left,Circumferential Toe Fifth MARICE, GUIDONE ([id number]) o [place] Nurse may visit PRN to address patientos wound care needs. o FACE TO FACE ENCOUNTER: MEDICARE and MEDICAID PATIENTS: I certify that this patient is under my care and that I had a face-to-face encounter that meets the physician face-to-face encounter requirements with this patient on this date. The encounter with the patient was in whole or in part for the following MEDICAL CONDITION: (primary reason for [place]) MEDICAL NECESSITY: I certify, that based on my findings, NURSING services are a medically necessary home health service. HOME BOUND STATUS: I certify that my clinical findings support that this patient is homebound (i.e., Due to illness or injury, pt requires aid of supportive devices such as crutches, cane, wheelchairs, walkers, the use of special transportation or the assistance of another person to leave their place of residence. There is a normal inability to leave the home and doing so requires considerable and taxing effort. Other absences are for medical reasons / religious services and are infrequent or of short duration when for other reasons). o If current dressing  causes regression in wound condition, may D/C ordered dressing product/s and apply Normal Saline Moist Dressing daily until next [place] / Other MD appointment. Allen of regression in wound condition at [phone]. o Please direct any NON-WOUND related issues/requests for orders to patient's Primary Care Physician Wound #11 [place] Visits o Home Health Nurse may visit PRN to address patientos wound care needs. o FACE TO FACE ENCOUNTER: MEDICARE and MEDICAID PATIENTS: I certify that this patient is under my care and that I had a face-to-face encounter that meets the physician face-to-face encounter requirements  with this patient on this date. The encounter with the patient was in whole or in part for the following MEDICAL CONDITION: (primary reason for [place]) MEDICAL NECESSITY: I certify, that based on my findings, NURSING services are a medically necessary home health service. HOME BOUND STATUS: I certify that my clinical findings support that this patient is homebound (i.e., Due to illness or injury, pt requires aid of supportive devices such as crutches, cane, wheelchairs, walkers, the use of special transportation or the assistance of another person to leave their place of residence. There is a normal inability to leave the home and doing so requires considerable and taxing effort. Other absences are for medical reasons / religious services and are infrequent or of short duration when for other reasons). o If current dressing causes regression in wound condition, may D/C ordered dressing product/s and apply Normal Saline Moist Dressing daily until next [place] / Other MD appointment. [place] of regression in wound condition at [phone]. o Please direct any NON-WOUND related issues/requests for orders to patient's Primary Care Physician Wound #3 [place] Nurse may visit PRN to address patientos wound care needs. o FACE TO FACE ENCOUNTER: MEDICARE and MEDICAID PATIENTS: I certify that this patient is under my care and that I had a face-to-face encounter that meets the physician face-to-face encounter requirements with this patient on this date. The encounter with the patient was in whole or in part for the following MEDICAL CONDITION: (primary reason for [place]) MEDICAL NECESSITY: I certify, that based on my findings, NURSING services are a medically necessary home health service. HOME BOUND STATUS: I certify that my clinical findings support that this patient is homebound  (i.e., Due to illness or injury, pt requires aid of supportive devices such as crutches, cane, wheelchairs, walkers, the use of special transportation or the assistance of another person to leave their place of residence. There is a normal inability to leave the home and doing so requires considerable and taxing effort. Other absences are for medical reasons / religious services and are infrequent or of short duration when for other reasons). o If current dressing causes regression in wound condition, may D/C ordered dressing product/s and apply Normal Saline Moist Dressing daily until next [place] / Other MD appointment. [place] of regression in wound condition at [phone]. o Please direct any NON-WOUND related issues/requests for orders to patient's Primary Care Physician Wound #4 [place] Nurse may visit PRN to address patientos wound care needs. o FACE TO FACE ENCOUNTER: MEDICARE and MEDICAID PATIENTS: I certify that this patient is under my care and that I had a face-to-face encounter that meets the physician face-to-face encounter requirements with this [name], [name]. ([id number]) patient on this date.  The encounter with the patient was in whole or in part for the following MEDICAL CONDITION: (primary reason for [place]) MEDICAL NECESSITY: I certify, that based on my findings, NURSING services are a medically necessary home health service. HOME BOUND STATUS: I certify that my clinical findings support that this patient is homebound (i.e., Due to illness or injury, pt requires aid of supportive devices such as crutches, cane, wheelchairs, walkers, the use of special transportation or the assistance of another person to leave their place of residence. There is a normal inability to leave the home and doing so requires considerable and taxing effort. Other absences are for medical  reasons / religious services and are infrequent or of short duration when for other reasons). o If current dressing causes regression in wound condition, may D/C ordered dressing product/s and apply Normal Saline Moist Dressing daily until next [place] / Other MD appointment. [place] of regression in wound condition at [phone]. o Please direct any NON-WOUND related issues/requests for orders to patient's Primary Care Physician Wound #5 Left,Circumferential Toe [place] Nurse may visit PRN to address patientos wound care needs. o FACE TO FACE ENCOUNTER: MEDICARE and MEDICAID PATIENTS: I certify that this patient is under my care and that I had a face-to-face encounter that meets the physician face-to-face encounter requirements with this patient on this date. The encounter with the patient was in whole or in part for the following MEDICAL CONDITION: (primary reason for [place]) MEDICAL NECESSITY: I certify, that based on my findings, NURSING services are a medically necessary home health service. HOME BOUND STATUS: I certify that my clinical findings support that this patient is homebound (i.e., Due to illness or injury, pt requires aid of supportive devices such as crutches, cane, wheelchairs, walkers, the use of special transportation or the assistance of another person to leave their place of residence. There is a normal inability to leave the home and doing so requires considerable and taxing effort. Other absences are for medical reasons / religious services and are infrequent or of short duration when for other reasons). o If current dressing causes regression in wound condition, may D/C ordered dressing product/s and apply Normal Saline Moist Dressing daily until next [place] / Other MD appointment. [place] of regression in wound condition at  [phone]. o Please direct any NON-WOUND related issues/requests for orders to patient's Primary Care Physician Wound #6 Left,Circumferential Toe [place] Nurse may visit PRN to address patientos wound care needs. o FACE TO FACE ENCOUNTER: MEDICARE and MEDICAID PATIENTS: I certify that this patient is under my care and that I had a face-to-face encounter that meets the physician face-to-face encounter requirements with this patient on this date. The encounter with the patient was in whole or in part for the following MEDICAL CONDITION: (primary reason for [place]) MEDICAL NECESSITY: I certify, that based on my findings, NURSING services are a medically necessary home health service. HOME BOUND STATUS: I certify that my clinical findings support that this patient is homebound (i.e., Due to illness or injury, pt requires aid of supportive devices such as crutches, cane, wheelchairs, walkers, the use of special transportation or the assistance of another person to leave their place of residence. There is a normal inability to leave the home and doing so requires considerable and taxing effort. Other absences are for medical reasons / religious  services and are infrequent or of short duration when for other reasons). o If current dressing causes regression in wound condition, may D/C ordered dressing product/s and apply Normal Saline Moist Dressing daily until next [place] / Other MD appointment. [place] of regression in wound condition at [phone]. o Please direct any NON-WOUND related issues/requests for orders to patient's Primary Care Physician Wound #7 Right [place] Nurse may visit PRN to address patientos wound care needs. o FACE TO FACE ENCOUNTER: MEDICARE and MEDICAID PATIENTS: I certify that this patient is under my care and that I had a  face-to-face encounter that meets the physician face-to-face encounter requirements with this patient on this date. The encounter with the patient was in whole or in part for the following MEDICAL CONDITION: (primary reason for [place]) MEDICAL NECESSITY: I certify, that based on my findings, NURSING services are a medically necessary home health service. HOME BOUND STATUS: I certify that my clinical findings support that this patient is homebound (i.e., Due to illness or injury, pt requires aid of supportive devices such as crutches, cane, wheelchairs, walkers, the use of special transportation or the [place], SHAMARIE CALL. ([id number]) assistance of another person to leave their place of residence. There is a normal inability to leave the home and doing so requires considerable and taxing effort. Other absences are for medical reasons / religious services and are infrequent or of short duration when for other reasons). o If current dressing causes regression in wound condition, may D/C ordered dressing product/s and apply Normal Saline Moist Dressing daily until next [place] / Other MD appointment. [place] of regression in wound condition at [phone]. o Please direct any NON-WOUND related issues/requests for orders to patient's Primary Care Physician Wound #8 [place] Visits o Home Health Nurse may visit PRN to address patientos wound care needs. o FACE TO FACE ENCOUNTER: MEDICARE and MEDICAID PATIENTS: I certify that this patient is under my care and that I had a face-to-face encounter that meets the physician face-to-face encounter requirements with this patient on this date. The encounter with the patient was in whole or in part for the following MEDICAL CONDITION: (primary reason for [place]) MEDICAL NECESSITY: I certify, that based on my findings, NURSING services are a medically necessary home health  service. HOME BOUND STATUS: I certify that my clinical findings support that this patient is homebound (i.e., Due to illness or injury, pt requires aid of supportive devices such as crutches, cane, wheelchairs, walkers, the use of special transportation or the assistance of another person to leave their place of residence. There is a normal inability to leave the home and doing so requires considerable and taxing effort. Other absences are for medical reasons / religious services and are infrequent or of short duration when for other reasons). o If current dressing causes regression in wound condition, may D/C ordered dressing product/s and apply Normal Saline Moist Dressing daily until next [place] / Other MD appointment. [place] of regression in wound condition at [phone]. o Please direct any NON-WOUND related issues/requests for orders to patient's Primary Care Physician Patient Medications Allergies: Documentation for allergies has not been submitted Notifications Medication Indication Start End lidocaine DOSE topical 4 % cream - cream topical Electronic Signature(s) Signed: 01/17/2018 10:02:55 PM By: Worthy Keeler PA-C Signed: 01/19/2018 10:40:22 AM By: Gretta Cool, [name], RN, CWS, Kim  RN, [name] Entered By: Gretta Cool, [name], RN, CWS, Kim on 01/16/2018 16:07:16 [place], [name] ([id number]) -------------------------------------------------------------------------------- Prescription 01/16/2018 Patient Name: Jamie Martyr. Provider: Worthy Keeler PA-C Date of Birth: 03/30/1966 NPI#: [id number] Sex: F DEA#: [id number] Phone #: [phone] License #: Patient Address: [facility] [place], [place] [address] [address], [place], [place] [address] [phone] Allergies Medication Medication: Route: Strength: Form: lidocaine 4 % topical cream topical 4%  cream Class: TOPICAL LOCAL ANESTHETICS Dose: Frequency / Time: Indication: cream topical Number of Refills: Number of Units: 0 Generic Substitution: Start Date: End Date: One Time Use: Substitution Permitted No Note to Pharmacy: Signature(s): Date(s): Electronic Signature(s) Signed: 01/17/2018 10:02:55 PM By: Worthy Keeler PA-C Signed: 01/19/2018 10:40:22 AM By: Gretta Cool, [name], RN, CWS, [name], [name] Entered By: Gretta Cool, [name], RN, CWS, Kim on 01/16/2018 16:07:19 [name], [name] ([id number]) --------------------------------------------------------------------------------  Problem List Details Patient Name: ARIAUNA, FARABEE. Date of Service: 01/16/2018 2:45 PM Medical Record Number: [id number] Patient Account Number: [id number] Date of Birth/Sex: Apr 07, 1966 (52 y.o. 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 ([id number]) -------------------------------------------------------------------------------- Progress Note Details Patient Name: Jamie Martyr. Date of Service: 01/16/2018 2:45 PM Medical Record Number: [id number] Patient Account Number:  [id number] Date of Birth/Sex: 02-08-1966 (52 y.o. Female) Treating RN: Cornell Barman Primary Care Provider: Delight Stare Other Clinician: Referring Provider: Delight Stare Treating Provider/Extender: Melburn Hake, Shantasia [name] Weeks in Treatment: 4 Subjective Chief Complaint Information obtained from Patient She is here in follow up evaluation for multiple wounds History of Present Illness (HPI) 52 year old patient was sent to [place] from [facility] where she was seen by Dr. Sherril Cong for a left ankle ulceration and was recently hospitalized with hypotension and sepsis. She was treated with IV antibiotics and has been scheduled to see Dr. Sharol Given. She was seen by vascular surgery who recommended a femoral bypass but surgery has been delayed until her sacral wound from last year's necrotizing fasciitis has healed. Was seeing the wound care team at [facility] but wanted to change over. She is a smoker and smokes a pack of cigarettes a day The patient was recently admitted in [place] between February 2 and February 14. She had a follow-up to see vascular surgery, orthopedic surgery and infectious disease. 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 Review of the electronic medical records reveals that Dr. 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  An MRI showed edema in the bone which was consistent with osteomyelitis and the chronic ulcer and may need surgical intervention. She also had a sacral decubitus ulcer which was treated with the wound VAC. On 01/07/2017 she was taken  up by the vascular surgeon Dr. Trula Slade for an abdominal aortogram and bilateral lower extremity runoff, for a history of having bilateral femoropopliteal bypass graft as well as external iliac stenting on the right and stenting of her bypass graft. She had developed a nonhealing wound on her right ankle and there was a possibility of a femoral occlusion. the findings were noted and the impression was a surgical revascularization with a aorto bifemoral bypass graft. Both the femoropopliteal bypass grafts were patent. 01/31/2017 -- x-ray of the right hip and pelvis -- IMPRESSION:No radiographic evidence of acute osteomyelitis. Normal-appearing right hip joint space for age. No acute bony abnormality of the hip. 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 Since seeing her last week she has not had an appointment with infectious disease or the orthopedic specialist yet. 02/06/2017 -- the patient missed a couple of appointments yesterday due to the weather but other than that has apparently given up smoking for the last 5 days. 02/13/2017 -- she has rescheduled her infectious disease appointment and also the appointment with the orthopedic surgeon Dr. Sharol Given 03/06/2017 -- she was seen by Dr. Lucianne Lei dam of infectious disease on 03/05/2017 and he recommendedIV antibiotics but the patient did not want to have that and he has given her Augmentin and doxycycline and will reevaluate her in 2 months time. 03/13/2017 -- he saw Dr. Trula Slade regarding her vascular issues and he would like to wait to the sacral wound is completely closed before considering aortobifemoral bypass graft. He will see her back in 2 months time. She was also seen by Dr. Sharol Given of orthopedics who recommended continue wound care dressings and follow in the office in about 2 months time. he also recommended 3 view radiographs of the right ankle at follow-up. 03/28/2017 -- she did have a  PICC line placed and Dr. Lucianne Lei dam has begun IV vancomycin and ceftriaxone. she is going to continue this until she sees him in approximately a month's time 04/18/2017 -- we had applied for a skin substitute for the patient's care but her copayment is going to be about $300 a piece and the patient will not be able to afford this. 05/08/17 on evaluation today patient appears to potentially have a fungal rash in the periwound region of the sacral area. This [name], [name] ([id number]) also seems to extend into the inguinal creases and factional region as well. She is tender to palpation with light rubbing over the region of the periwound and the skin in this region does have a beefy red appearance. Everything seems to point to a fungal infection at this time. She has been tolerating the dressing changes up to this point. There is no evidence of infection otherwise. 05/16/2017 -- was seen by Dr. 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 He did not think that she is making progress and was not able to examine her sacral decubitus ulcer as the patient would not allow it. She was switched to Bactrim DS 2 tablets twice a day since finishing her antibiotics. Her ESR was 76 and a CRP was 2.8. He again discussed with  her that she would need amputation to cure her osteomyelitis of fibula but he would continue to try suppressive antibiotics. 05/23/17 on evaluation today patient's wound appears to be doing about the same in regard to the ankle as well as the gluteal area. She apparently has issues with the one back staying in place and she states the sill seems to break up the inferior aspect. She does have home help coming out to apply the wound VAC. She has been tolerating the dressing changes without any problem although she has had some increased pain in the gluteal region due to a fall she sustained and she feels like  she brews that area and she had pain actually radiating down her leg which is slowly beginning to improve. There's no evidence of infection. 05/29/17 Unfortunately on evaluation today patient's wounds both in regard to the ankle as well as in regard to her gluteal region on the right appear to be measuring the same as last week's evaluation. She also tells me that she is having soreness today in regard to the right gluteal area because she had a visitor who came to see her a couple days ago and stayed for six hours and she had to set up for that entire length of time visiting. This has called some increased discomfort. Normally she does not set up for those long periods of time. Fortunately there's no evidence of infection and no worsening of the wounds. 06/13/2017 -- the patient has had a lot of health issues including perfuse diarrhea and generalized weakness and I have instructed her to see her PCP as soon as possible and if things get worse to go to the ER. She has not brought her wound VAC today. She continues to be on oral antibiotics 06/30/17- she is here in follow up evaluation for right ischial and right lateral malleolus ulcer. She continues with negative pressure wound therapy to the ischial wound. She is currently on a diabetic therapy per infectious disease, although she does state that she inconsistently takes it. She was advised to take her antibiotic as prescribed 07/11/2017 -- she has been unable to use a wound VAC regularly because she loses this evening and it has not been possible for her to use it. 07/18/2017 -- she is much more comfortable and looks better without the wound VAC and at this stage I believe it will be better for her to return the machine and continue with local care. 08/07/2017 -- she is back up to 3 weeks due to a vacation and during this time her wound VAC was returned. She has been doing local dressings 08/15/17 on evaluation today patient appears to be doing  about the same in regard to her ankle as well as right gluteal wound. She has been tolerating the dressing changes and tells me that it's mainly her gluteal wound that hurts the ankle is nontender to palpation. She rates this pain to be a one out of 10 at rest and with cleansing of the wound five out of 10 at least. No fevers, chills, nausea, or vomiting noted at this time. She does note that home health has told her to inquire about possibly using silver nitrate on the wound edges due to the road nature of the edges. 08/29/2017 -- -- I got a note which Dr. Lucianne Lei dam sent to Dr. 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 He had recommended definitive surgery to fix this. He  recommended to continue with Bactrim Right ankle x-ray -- IMPRESSION: Findings compatible with cellulitis of the lateral malleolar region. A small focus of bony destruction involving the tip of the lateral malleolus is worrisome for osteomyelitis. 09/05/2017 -- the patient was seen by Dr. Meridee Score yesterday who thought her wound looked good and he would continue local care with silver alginate dressing and follow-up in 4 weeks. He did not recommend any surgical intervention of the ankle until there is good arterial flow and he would await Dr. Stephens Shire opinion regarding this. She was also seen by infectious disease Dr. Lucianne Lei dam, who was concerned about her elevated potassium and took her off Bactrim. he has recommended doxycycline. 09/26/2017 -- she is back after 3 weeks as she was out of town on vacation. She has put on some weight but other than that ALZADA, BRAZEE. ([id number]) no change in her health 10/24/2017 -- she was seen by Dr. [name] -- after a thorough review he plans a angiographically via the left femoral approach to evaluate her left subclavian artery and address any stenosis at that time. He will also perform bilateral runoff  to confirm that a right femoropopliteal as well as a left femoropopliteal bypass graft remained patent. He is then considering a right axillary to femoral bypass graft for limb salvage. ==== 12/18/17-she is here in follow-up evaluation for multiple wounds. She is s/p aortobifemoral and left fem-pop revision. She presents with multiple necrotic areas to the left toes and lateral foot. The wounds we were originally seen her for (right heel and left buttock) are improved/stable. According to her husband the wounds to the left foot are "better" than they were upon discharge from the hospital last Tuesday. He states that Dr. Trula Slade saw her on same day of discharge. She has a follow-up with Dr. Trula Slade on Monday. There are dopplerable pulses to the left dorsalis pedis, posterior tibial, peroneal. We communicated with Dr. Stephens Shire nurse regarding today's findings, photos were sent to her email and the patient will maintain her appointment on Monday. We discussed with the patient and her husband to report to [facility] in [place] for any concerns, changes, new areas of necrosis, new areas or progressive ischemia/purple discoloration, pallor, new/increased pain. We will anticipate follow up next week 12/30/17 patient unfortunately continues to have multiple wound of her lower extremities and specifically her left toes and lateral foot. She does have regular follow-up visits with Dr. Trula Slade who performed her surgery and at this point they are just holding to wait and see what needs to be done in regard to her foot such as what's going to heal and what may need to have potential amputation of further surgery. For this reason we are just trying to maintain and see were things end up at this point. Fortunately patient is not having any significant discomfort. The right lateral malleolus appears to be doing about the same there is some granulation tissue she does have osteomyelitis and not ulcer. 01/06/18  patient presents today for fault evaluation concerning her ongoing issues with her right lateral ankle, right gluteal region, and left lower extremity. Currently regard to left lower extremity it's possible that she may require some amputation although patient states she has the appointment follow-up with Dr. Trula Slade next week. That's when she will find out what he thinks needs to be done at that point. Nonetheless in regard to the right lateral ankle this appears to be doing fairly well today. Her right gluteal region actually  appears to be doing fairly well with a good wound surface any slough that was present easily cleaned off with saline and gauze. 01/16/18 on evaluation today patient appears to be doing much better actually in regard to her left foot necrotic areas. A lot of this is starting to liftoff as far as the eschar is concerned and there is a lot of healing underneath which is excellent news. I'm actually pleasantly pleased with the results that she's getting in this regard. Fortunately there does not appear to be any evidence of infection which is great news. Overall she has been tolerating the dressing changes without complication she does have one new ulcer on the right anterior shin due to a small skin tear which is actually healed but then the adhesive from a Band-Aid that was put on it at a doctor's office actually has 20 skin and she says while he has a small ulcer due to that. Fortunately this is superficial. Patient History Information obtained from Patient. Family History Diabetes - Mother,Father,Maternal Grandparents,Paternal Grandparents,Child, Heart Disease - [place] Grandparents, Hereditary Spherocytosis - Mother, Hypertension - Mother,Siblings,Paternal Grandparents,Maternal Grandparents, Seizures - Maternal Grandparents,Paternal Grandparents, Thyroid Problems - Siblings, No family history of Cancer, Kidney Disease, Lung Disease,  Stroke, Tuberculosis. Social History Current every day smoker, Marital Status - Married, Alcohol Use - Rarely, Drug Use - No History, Caffeine Use - Moderate. Review of Systems (ROS) Constitutional Symptoms (General Health) Denies complaints or symptoms of Fever, Chills. Respiratory The patient has no complaints or symptoms. Cardiovascular The patient has no complaints or symptoms. MAYANNA, GARLITZ ([id number]) Psychiatric The patient has no complaints or symptoms. Objective Constitutional Well-nourished and well-hydrated in no acute distress. Vitals Time Taken: 3:29 AM, Height: 67 in, Temperature: 98.8 F, Pulse: 91 bpm, Respiratory Rate: 018 breaths/min, Blood Pressure: 77/45 mmHg. Respiratory normal breathing without difficulty. Psychiatric this patient is able to make decisions and demonstrates good insight into disease process. Alert and Oriented x 3. pleasant and cooperative. General Notes: Patient's wounds appear to be fairly stable she is good granulation in regard to the sacral region as was the right lateral malleolus. She does have a lot of the eschar starting to come off in regard to the left foot and underneath there is a lot of healing tissue. Obviously I'm definitely pleased in this regard. She sees Dr. Trula Slade next week. I did selectively debride away some of the necrotic eschar that was coming loose so that it would not get pulled in some school he injured her going forward. Integumentary (Hair, Skin) Wound #10 status is Open. Original cause of wound was Gradually Appeared. The wound is located on the Left,Circumferential Toe Fifth. The wound measures 6cm length x 2cm width x 0.1cm depth; 9.425cm^2 area and 0.942cm^3 volume. There is no tunneling or undermining noted. There is a medium amount of serosanguineous drainage noted. The wound margin is distinct with the outline attached to the wound base. There is no granulation within the wound bed. There is a large  (67-100%) amount of necrotic tissue within the wound bed including Eschar. Periwound temperature was noted as No Abnormality. The periwound has tenderness on palpation. Wound #11 status is Open. Original cause of wound was Trauma. The wound is located on the Right,Medial Lower Leg. The wound measures 1.5cm length x 1.2cm width x 0.1cm depth; 1.414cm^2 area and 0.141cm^3 volume. There is no tunneling or undermining noted. There is a medium amount of serosanguineous drainage noted. The wound margin is flat and intact. There is  medium (34-66%) red granulation within the wound bed. There is a medium (34-66%) amount of necrotic tissue within the wound bed including Eschar and Adherent Slough. The periwound skin appearance did not exhibit: Callus, Crepitus, Excoriation, Induration, Rash, Scarring, Dry/Scaly, Maceration, Atrophie Blanche, Cyanosis, Ecchymosis, Hemosiderin Staining, Mottled, Pallor, Rubor, Erythema. Periwound temperature was noted as No Abnormality. The periwound has tenderness on palpation. Wound #3 status is Open. Original cause of wound was Pressure Injury. The wound is located on the Right,Lateral Malleolus. The wound measures 0.5cm length x 0.8cm width x 0.2cm depth; 0.314cm^2 area and 0.063cm^3 volume. There is a large amount of serous drainage noted. The wound margin is distinct with the outline attached to the wound base. There is large (67-100%) pink granulation within the wound bed. There is a small (1-33%) amount of necrotic tissue within the wound bed including Adherent Slough. The periwound skin appearance did not exhibit: Callus, Crepitus, Excoriation, Induration, Rash, Scarring, Dry/Scaly, Maceration, Atrophie Blanche, Cyanosis, Ecchymosis, Hemosiderin Staining, Mottled, Pallor, Rubor, Erythema. Periwound temperature was noted as No Abnormality. Wound #4 status is Open. Original cause of wound was Blister. The wound is located on the Left,Lateral Calcaneus. The [name], Jamie  S. ([id number]) wound measures 0.8cm length x 1cm width x 0.1cm depth; 0.628cm^2 area and 0.063cm^3 volume. Wound #5 status is Open. Original cause of wound was Gradually Appeared. The wound is located on the Left,Circumferential Toe Great. The wound measures 2.1cm length x 4.2cm width x 0.1cm depth; 6.927cm^2 area and 0.693cm^3 volume. There is no tunneling or undermining noted. There is a none present amount of drainage noted. The wound margin is distinct with the outline attached to the wound base. There is no granulation within the wound bed. There is a large (67-100%) amount of necrotic tissue within the wound bed including Eschar. The periwound skin appearance did not exhibit: Callus, Crepitus, Excoriation, Induration, Rash, Scarring, Dry/Scaly, Maceration, Atrophie Blanche, Cyanosis, Ecchymosis, Hemosiderin Staining, Mottled, Pallor, Rubor, Erythema. Periwound temperature was noted as No Abnormality. Wound #6 status is Open. Original cause of wound was Gradually Appeared. The wound is located on the Left,Circumferential Toe Fourth. The wound measures 1.6cm length x 6cm width x 0.1cm depth; 7.54cm^2 area and 0.754cm^3 volume. There is no tunneling or undermining noted. There is a large amount of serosanguineous drainage noted. The wound margin is distinct with the outline attached to the wound base. There is no granulation within the wound bed. There is a large (67-100%) amount of necrotic tissue within the wound bed including Eschar and Adherent Slough. The periwound skin appearance exhibited: Dry/Scaly. The periwound skin appearance did not exhibit: Callus, Crepitus, Excoriation, Induration, Rash, Scarring, Maceration, Atrophie Blanche, Cyanosis, Ecchymosis, Hemosiderin Staining, Mottled, Pallor, Rubor, Erythema. Periwound temperature was noted as No Abnormality. Wound #7 status is Open. Original cause of wound was Gradually Appeared. The wound is located on the Right Gluteus. The wound  measures 1.5cm length x 1cm width x 0.4cm depth; 1.178cm^2 area and 0.471cm^3 volume. There is no tunneling or undermining noted. There is a large amount of serosanguineous drainage noted. The wound margin is distinct with the outline attached to the wound base. There is large (67-100%) red granulation within the wound bed. There is a small (1-33%) amount of necrotic tissue within the wound bed including Adherent Slough. The periwound skin appearance exhibited: Scarring. The periwound skin appearance did not exhibit: Callus, Crepitus, Excoriation, Induration, Rash, Dry/Scaly, Maceration, Atrophie Blanche, Cyanosis, Ecchymosis, Hemosiderin Staining, Mottled, Pallor, Rubor, Erythema. Periwound temperature was noted as No Abnormality. The  periwound has tenderness on palpation. Wound #8 status is Open. Original cause of wound was Gradually Appeared. The wound is located on the Left,Lateral Foot. The wound measures 12.5cm length x 2.9cm width x 0.2cm depth; 28.471cm^2 area and 5.694cm^3 volume. There is no tunneling or undermining noted. There is a medium amount of serosanguineous drainage noted. The wound margin is distinct with the outline attached to the wound base. There is no granulation within the wound bed. There is a large (67-100%) amount of necrotic tissue within the wound bed including Eschar. The periwound skin appearance did not exhibit: Callus, Crepitus, Excoriation, Induration, Rash, Scarring, Dry/Scaly, Maceration, Atrophie Blanche, Cyanosis, Ecchymosis, Hemosiderin Staining, Mottled, Pallor, Rubor, Erythema. Periwound temperature was noted as No Abnormality. 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 ([id number]) Wound #10 Left,Circumferential Toe Fifth: Clean wound with Normal Saline. Wound #11 Right,Medial Lower Leg: Clean wound with Normal Saline. Wound #3 Right,Lateral Malleolus: Clean wound with Normal Saline. Wound #4 Left,Lateral Calcaneus: Clean wound with Normal Saline. Wound #5 Left,Circumferential Toe Great: Clean wound with Normal Saline. Wound #6 Left,Circumferential Toe Fourth: Clean wound with Normal Saline. Wound #7 Right Gluteus: Clean wound with Normal Saline. Wound #8 Left,Lateral Foot: Clean wound with Normal Saline. Anesthetic (add to Medication List): Wound #11 Right,Medial Lower Leg: Topical Lidocaine 4% cream applied to wound bed prior to debridement (In Clinic Only). Wound #4 Left,Lateral Calcaneus: Topical Lidocaine 4% cream applied to wound bed prior to debridement (In Clinic Only). Wound #7 Right Gluteus: Topical Lidocaine 4% cream applied to wound bed prior to debridement (In Clinic Only). Skin Barriers/Peri-Wound Care: Wound #10 Left,Circumferential Toe Fifth: Skin Prep Wound #11 Right,Medial Lower Leg: Skin Prep Wound #3 Right,Lateral Malleolus: Skin Prep Wound #4 Left,Lateral Calcaneus: Skin Prep Wound #5 Left,Circumferential Toe Great: Skin Prep Wound #6 Left,Circumferential Toe Fourth: Skin Prep Wound #7 Right Gluteus: Skin Prep Wound #8 Left,Lateral Foot: Skin Prep Primary Wound Dressing: Wound #10 Left,Circumferential Toe Fifth: Other: - betadine or providone-iodine (paint on wound) before you place on silver alginate Wound #11 Right,Medial Lower Leg: Prisma Ag Wound #3 Right,Lateral Malleolus: Prisma Ag - moisten with saline Wound #4 Left,Lateral Calcaneus: Other: - betadine or providone-iodine (paint on wound) before you place on silver alginate Wound #5 Left,Circumferential Toe Great: Other:  - betadine or providone-iodine (paint on wound) before you place on silver alginate Wound #6 Left,Circumferential Toe Fourth: Other: - betadine or providone-iodine (paint on wound) before you place on silver alginate Wound #7 Right Gluteus: Prisma Ag Wound #8 Left,Lateral Foot: Other: - betadine or providone-iodine (paint on wound) before you place on silver alginate Secondary Dressing: Wound #10 Left,Circumferential Toe Fifth: ABD pad Crouse, Joyanne S. ([id number]) Conform/Kerlix Wound #11 Right,Medial Lower Leg: ABD pad Conform/Kerlix Wound #3 Right,Lateral Malleolus: Boardered Foam Dressing Wound #4 Left,Lateral Calcaneus: ABD pad Conform/Kerlix Wound #5 Left,Circumferential Toe Great: ABD pad Conform/Kerlix Wound #6 Left,Circumferential Toe Fourth: ABD pad Conform/Kerlix Wound #7 Right Gluteus: Boardered Foam Dressing Wound #8 Left,Lateral Foot: ABD pad Conform/Kerlix Dressing Change Frequency: Wound #10 Left,Circumferential Toe Fifth: Change dressing every other day. Other: - PRN Wound #11 Right,Medial Lower Leg: Change dressing every other day. Other: - PRN  Wound #3 Right,Lateral Malleolus: Change dressing every other day. Other: - PRN Wound #4 Left,Lateral Calcaneus: Change dressing every other day. Other: - PRN Wound #5 Left,Circumferential Toe Great: Change dressing every other day. Other: - PRN Wound #6 Left,Circumferential Toe Fourth: Change dressing every other day. Other: - PRN Wound #7 Right Gluteus: Change dressing every other day. Other: - PRN Wound #8 Left,Lateral Foot: Change dressing every other day. Other: - PRN Follow-up Appointments: Wound #10 Left,Circumferential Toe Fifth: Return Appointment in 1 week. Wound #11 Right,Medial Lower Leg: Return Appointment in 1 week. Wound #3 Right,Lateral Malleolus: Return Appointment in 1 week. Wound #4 Left,Lateral Calcaneus: Return Appointment in 1 week. Wound #5 Left,Circumferential Toe  Great: Return Appointment in 1 week. Wound #6 Left,Circumferential Toe Fourth: Return Appointment in 1 week. Wound #7 Right Gluteus: Return Appointment in 1 week. [name], [name] ([id number]) Wound #8 Left,Lateral Foot: Return Appointment in 1 week. Off-Loading: Wound #7 Right Gluteus: Turn and reposition every 2 hours Additional Orders / Instructions: Wound #10 Left,Circumferential Toe Fifth: Increase protein intake. Wound #11 Right,Medial Lower Leg: Increase protein intake. Wound #3 Right,Lateral Malleolus: Increase protein intake. Wound #4 Left,Lateral Calcaneus: Increase protein intake. Wound #5 Left,Circumferential Toe Great: Increase protein intake. Wound #6 Left,Circumferential Toe Fourth: Increase protein intake. Wound #7 Right Gluteus: Increase protein intake. Wound #8 Left,Lateral Foot: Increase protein intake. Home Health: Wound #10 Left,Circumferential Toe Fifth: [place] Nurse may visit PRN to address patient s wound care needs. FACE TO FACE ENCOUNTER: MEDICARE and MEDICAID PATIENTS: I certify that this patient is under my care and that I had a face-to-face encounter that meets the physician face-to-face encounter requirements with this patient on this date. The encounter with the patient was in whole or in part for the following MEDICAL CONDITION: (primary reason for [place]) MEDICAL NECESSITY: I certify, that based on my findings, NURSING services are a medically necessary home health service. HOME BOUND STATUS: I certify that my clinical findings support that this patient is homebound (i.e., Due to illness or injury, pt requires aid of supportive devices such as crutches, cane, wheelchairs, walkers, the use of special transportation or the assistance of another person to leave their place of residence. There is a normal inability to leave the home and doing so requires considerable and taxing effort. Other absences are for  medical reasons / religious services and are infrequent or of short duration when for other reasons). If current dressing causes regression in wound condition, may D/C ordered dressing product/s and apply Normal Saline Moist Dressing daily until next [place] / Other MD appointment. [place] of regression in wound condition at [phone]. Please direct any NON-WOUND related issues/requests for orders to patient's Primary Care Physician Wound #11 Right,Medial Lower Leg: [place] Nurse may visit PRN to address patient s wound care needs. FACE TO FACE ENCOUNTER: MEDICARE and MEDICAID PATIENTS: I certify that this patient is under my care and that I had a face-to-face encounter that meets the physician face-to-face encounter requirements with this patient on this date. The encounter with the patient was in whole or in part for the following MEDICAL CONDITION: (primary reason for [place]) MEDICAL NECESSITY: I certify, that based on my findings, NURSING services are a medically necessary home health service. HOME BOUND STATUS: I certify that my clinical findings support that this patient is homebound (i.e., Due to illness or injury, pt requires aid of supportive devices such as  crutches, cane, wheelchairs, walkers, the use of special transportation or the assistance of another person to leave their place of residence. There is a normal inability to leave the home and doing so requires considerable and taxing effort. Other absences are for medical reasons / religious services and are infrequent or of short duration when for other reasons). If current dressing causes regression in wound condition, may D/C ordered dressing product/s and apply Normal Saline Moist Dressing daily until next [place] / Other MD appointment. [place] of regression in wound condition at [phone]. Please direct any  NON-WOUND related issues/requests for orders to patient's Primary Care Physician Wound #3 Right,Lateral Malleolus: [place] Nurse may visit PRN to address patient s wound care needs. FACE TO FACE ENCOUNTER: MEDICARE and MEDICAID PATIENTS: I certify that this patient is under my care and that I had a face-to-face encounter that meets the physician face-to-face encounter requirements with this patient on this date. The OMNI, DUNSWORTH ([id number]) encounter with the patient was in whole or in part for the following MEDICAL CONDITION: (primary reason for Home Healthcare) MEDICAL NECESSITY: I certify, that based on my findings, NURSING services are a medically necessary home health service. HOME BOUND STATUS: I certify that my clinical findings support that this patient is homebound (i.e., Due to illness or injury, pt requires aid of supportive devices such as crutches, cane, wheelchairs, walkers, the use of special transportation or the assistance of another person to leave their place of residence. There is a normal inability to leave the home and doing so requires considerable and taxing effort. Other absences are for medical reasons / religious services and are infrequent or of short duration when for other reasons). If current dressing causes regression in wound condition, may D/C ordered dressing product/s and apply Normal Saline Moist Dressing daily until next [place] / Other MD appointment. [place] of regression in wound condition at [phone]. Please direct any NON-WOUND related issues/requests for orders to patient's Primary Care Physician Wound #4 Left,Lateral Calcaneus: [place] Nurse may visit PRN to address patient s wound care needs. FACE TO FACE ENCOUNTER: MEDICARE and MEDICAID PATIENTS: I certify that this patient is under my care and that I had a face-to-face encounter that meets the  physician face-to-face encounter requirements with this patient on this date. The encounter with the patient was in whole or in part for the following MEDICAL CONDITION: (primary reason for [place]) MEDICAL NECESSITY: I certify, that based on my findings, NURSING services are a medically necessary home health service. HOME BOUND STATUS: I certify that my clinical findings support that this patient is homebound (i.e., Due to illness or injury, pt requires aid of supportive devices such as crutches, cane, wheelchairs, walkers, the use of special transportation or the assistance of another person to leave their place of residence. There is a normal inability to leave the home and doing so requires considerable and taxing effort. Other absences are for medical reasons / religious services and are infrequent or of short duration when for other reasons). If current dressing causes regression in wound condition, may D/C ordered dressing product/s and apply Normal Saline Moist Dressing daily until next [place] / Other MD appointment. [place] of regression in wound condition at [phone]. Please direct any NON-WOUND related issues/requests for orders to patient's Primary Care Physician Wound #5 Left,Circumferential Toe Great: [place] Visits  Home Health Nurse may visit PRN to address patient s wound care needs. FACE TO FACE ENCOUNTER: MEDICARE and MEDICAID PATIENTS: I certify that this patient is under my care and that I had a face-to-face encounter that meets the physician face-to-face encounter requirements with this patient on this date. The encounter with the patient was in whole or in part for the following MEDICAL CONDITION: (primary reason for [place]) MEDICAL NECESSITY: I certify, that based on my findings, NURSING services are a medically necessary home health service. HOME BOUND STATUS: I certify that my clinical findings support  that this patient is homebound (i.e., Due to illness or injury, pt requires aid of supportive devices such as crutches, cane, wheelchairs, walkers, the use of special transportation or the assistance of another person to leave their place of residence. There is a normal inability to leave the home and doing so requires considerable and taxing effort. Other absences are for medical reasons / religious services and are infrequent or of short duration when for other reasons). If current dressing causes regression in wound condition, may D/C ordered dressing product/s and apply Normal Saline Moist Dressing daily until next [place] / Other MD appointment. [place] of regression in wound condition at [phone]. Please direct any NON-WOUND related issues/requests for orders to patient's Primary Care Physician Wound #6 Left,Circumferential Toe Fourth: [place] Nurse may visit PRN to address patient s wound care needs. FACE TO FACE ENCOUNTER: MEDICARE and MEDICAID PATIENTS: I certify that this patient is under my care and that I had a face-to-face encounter that meets the physician face-to-face encounter requirements with this patient on this date. The encounter with the patient was in whole or in part for the following MEDICAL CONDITION: (primary reason for [place]) MEDICAL NECESSITY: I certify, that based on my findings, NURSING services are a medically necessary home health service. HOME BOUND STATUS: I certify that my clinical findings support that this patient is homebound (i.e., Due to illness or injury, pt requires aid of supportive devices such as crutches, cane, wheelchairs, walkers, the use of special transportation or the assistance of another person to leave their place of residence. There is a normal inability to leave the home and doing so requires considerable and taxing effort. Other absences are for medical reasons /  religious services and are infrequent or of short duration when for other reasons). If current dressing causes regression in wound condition, may D/C ordered dressing product/s and apply Normal Saline Moist Dressing daily until next [place] / Other MD appointment. [place] of regression in wound condition at [phone]. Please direct any NON-WOUND related issues/requests for orders to patient's Primary Care Physician Wound #7 Right Gluteus: AUBREYANNA, DORROUGH ([id number]) [place] Nurse may visit PRN to address patient s wound care needs. FACE TO FACE ENCOUNTER: MEDICARE and MEDICAID PATIENTS: I certify that this patient is under my care and that I had a face-to-face encounter that meets the physician face-to-face encounter requirements with this patient on this date. The encounter with the patient was in whole or in part for the following MEDICAL CONDITION: (primary reason for [place]) MEDICAL NECESSITY: I certify, that based on my findings, NURSING services are a medically necessary home health service. HOME BOUND STATUS: I certify that my clinical findings support that this patient is homebound (i.e., Due to illness or injury, pt requires aid of supportive devices such as crutches,  cane, wheelchairs, walkers, the use of special transportation or the assistance of another person to leave their place of residence. There is a normal inability to leave the home and doing so requires considerable and taxing effort. Other absences are for medical reasons / religious services and are infrequent or of short duration when for other reasons). If current dressing causes regression in wound condition, may D/C ordered dressing product/s and apply Normal Saline Moist Dressing daily until next [place] / Other MD appointment. [place] of regression in wound condition at [phone]. Please direct any  NON-WOUND related issues/requests for orders to patient's Primary Care Physician Wound #8 Left,Lateral Foot: [place] Nurse may visit PRN to address patient s wound care needs. FACE TO FACE ENCOUNTER: MEDICARE and MEDICAID PATIENTS: I certify that this patient is under my care and that I had a face-to-face encounter that meets the physician face-to-face encounter requirements with this patient on this date. The encounter with the patient was in whole or in part for the following MEDICAL CONDITION: (primary reason for [place]) MEDICAL NECESSITY: I certify, that based on my findings, NURSING services are a medically necessary home health service. HOME BOUND STATUS: I certify that my clinical findings support that this patient is homebound (i.e., Due to illness or injury, pt requires aid of supportive devices such as crutches, cane, wheelchairs, walkers, the use of special transportation or the assistance of another person to leave their place of residence. There is a normal inability to leave the home and doing so requires considerable and taxing effort. Other absences are for medical reasons / religious services and are infrequent or of short duration when for other reasons). If current dressing causes regression in wound condition, may D/C ordered dressing product/s and apply Normal Saline Moist Dressing daily until next [place] / Other MD appointment. [place] of regression in wound condition at [phone]. Please direct any NON-WOUND related issues/requests for orders to patient's Primary Care Physician The following medication(s) was prescribed: lidocaine topical 4 % cream cream topical was prescribed at facility I am going to recommend that we continue with the current wound care measures and we went [place] to the new ulcer on the on the right shin. Patient is in agreement with the plan. We will see were things stand in  one weeks time when we see her for reevaluation. Please see above for specific wound care orders. We will see patient for re-evaluation in 1 week(s) here in the clinic. If anything worsens or changes patient will contact our office for additional recommendations. Electronic Signature(s) Signed: 01/20/2018 7:21:18 PM By: Worthy Keeler PA-C Previous Signature: 01/17/2018 10:02:55 PM Version By: Worthy Keeler PA-C Entered By: Worthy Keeler on 01/19/2018 15:58:23 Linhardt, [name] ([id number]) -------------------------------------------------------------------------------- ROS/PFSH Details Patient Name: Jamie Martyr. Date of Service: 01/16/2018 2:45 PM Medical Record Number: [id number] Patient Account Number: [id number] Date of Birth/Sex: 1966-01-10 (52 y.o. Female) Treating RN: Cornell Barman Primary Care Provider: Delight Stare Other Clinician: Referring Provider: Delight Stare Treating Provider/Extender: Melburn Hake, Darly Massi Weeks in Treatment: 4 Information Obtained From Patient Wound History Do you currently have one or more open woundso Yes How many open wounds do you currently haveo 4 Approximately how long have you had your woundso 1 month and other 7 yrs How have you been treating your wound(s) until nowo gauze Has your wound(s) ever healed and then re-openedo No Have you had any lab  work done in the past montho No Have you tested positive for an antibiotic resistant organism (MRSA, VRE)o No Have you tested positive for osteomyelitis (bone infection)o Yes Have you had any tests for circulation on your legso Yes Have you had other problems associated with your woundso Swelling Constitutional Symptoms (General Health) Complaints and Symptoms: Negative for: Fever; Chills Eyes Medical History: Negative for: Cataracts; Glaucoma; Optic Neuritis Ear/Nose/Mouth/Throat Medical History: Negative for: Chronic sinus problems/congestion; Middle ear problems Hematologic/Lymphatic Medical  History: Positive for: Anemia Negative for: Hemophilia; Human Immunodeficiency Virus; Lymphedema; Sickle Cell Disease Respiratory Complaints and Symptoms: No Complaints or Symptoms Medical History: Negative for: Aspiration; Asthma; Chronic Obstructive Pulmonary Disease (COPD); Pneumothorax; Sleep Apnea; Tuberculosis Cardiovascular Complaints and Symptoms: No Complaints or Symptoms Medical History: TIMAYA, BOJARSKI. ([id number]) Positive for: Arrhythmia - a-fib; Coronary Artery Disease; Hypotension; Peripheral Arterial Disease Gastrointestinal Medical History: Negative for: Cirrhosis ; Colitis; Crohnos; Hepatitis A; Hepatitis B; Hepatitis C Endocrine Medical History: Positive for: Type II Diabetes Treated with: Insulin, Oral agents Blood sugar tested every day: Yes Tested : 4 times a day Blood sugar testing results: Bedtime: 190 Genitourinary Medical History: Negative for: End Stage Renal Disease Immunological Medical History: Negative for: Lupus Erythematosus; Raynaudos; Scleroderma Integumentary (Skin) Medical History: Negative for: History of Burn; History of pressure wounds Musculoskeletal Medical History: Positive for: Osteoarthritis Neurologic Medical History: Negative for: Dementia; Neuropathy; Quadriplegia; Paraplegia; Seizure Disorder Oncologic Medical History: Negative for: Received Chemotherapy; Received Radiation Psychiatric Complaints and Symptoms: No Complaints or Symptoms Medical History: Negative for: Anorexia/bulimia; Confinement Anxiety Immunizations Pneumococcal Vaccine: Received Pneumococcal Vaccination: CONSEPCION, UTT ([id number]) Implantable Devices Family and Social History Cancer: No; Diabetes: Yes - Mother,Father,Maternal Grandparents,Paternal Grandparents,Child; Heart Disease: Yes - Father,Mother,Maternal Grandparents,Paternal Grandparents; Hereditary Spherocytosis: Yes - Mother; Hypertension: Yes - Mother,Siblings,Paternal  Grandparents,Maternal Grandparents; Kidney Disease: No; Lung Disease: No; Seizures: Yes - Maternal Grandparents,Paternal Grandparents; Stroke: No; Thyroid Problems: Yes - Siblings; Tuberculosis: No; Current every day smoker; Marital Status - Married; Alcohol Use: Rarely; Drug Use: No History; Caffeine Use: Moderate; Financial Concerns: No; Food, Clothing or Shelter Needs: No; Support System Lacking: No; Transportation Concerns: No; Advanced Directives: No; Patient does not want information on Advanced Directives; Do not resuscitate: No; Living Will: No; Medical Power of Attorney: No Physician Affirmation I have reviewed and agree with the above information. Electronic Signature(s) Signed: 01/17/2018 10:02:55 PM By: Worthy Keeler PA-C Signed: 01/19/2018 10:40:22 AM By: Gretta Cool, [name], RN, CWS, [name], [name] Entered By: Worthy Keeler on 01/16/2018 16:11:15 Leeman, [name] ([id number]) -------------------------------------------------------------------------------- SuperBill Details Patient Name: CHAYA, DEHAAN. Date of Service: 01/16/2018 Medical Record Number: [id number] Patient Account Number: [id number] Date of Birth/Sex: 12-Jun-1966 (52 y.o. 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, left leg S31.819S Unspecified open wound of right buttock, sequela L97.312 Non-pressure chronic ulcer of right ankle with fat layer exposed Facility Procedures CPT4 Code: [id number] Description: 56314 - WOUND CARE VISIT-LEV 5 EST PT Modifier: Quantity: 1 Physician Procedures CPT4: Description  Modifier Quantity Code [id number] 85885 - WC PHYS LEVEL 3 - EST PT 1 ICD-10 Diagnosis Description O27.741O Unspecified open wound of right buttock, sequela I70.393 Other atherosclerosis of unspecified type of bypass graft(s) of the  extremities, bilateral legs 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

## 2018-01-22 ENCOUNTER — Ambulatory Visit (HOSPITAL_COMMUNITY)
Admission: RE | Admit: 2018-01-22 | Discharge: 2018-01-22 | Disposition: A | Discharge status: Home / Self Care | Payer: Medicare Other | Source: Ambulatory Visit | Attending: Vascular Surgery | Admitting: Vascular Surgery

## 2018-01-22 DIAGNOSIS — I70233 Atherosclerosis of native arteries of right leg with ulceration of ankle: Secondary | ICD-10-CM | POA: Diagnosis present

## 2018-01-22 DIAGNOSIS — R9439 Abnormal result of other cardiovascular function study: Secondary | ICD-10-CM | POA: Insufficient documentation

## 2018-01-22 DIAGNOSIS — I739 Peripheral vascular disease, unspecified: Secondary | ICD-10-CM

## 2018-01-23 ENCOUNTER — Ambulatory Visit (HOSPITAL_COMMUNITY)
Admission: RE | Admit: 2018-01-23 | Discharge: 2018-01-23 | Disposition: A | Discharge status: Home / Self Care | Payer: Medicare Other | Source: Ambulatory Visit | Attending: Surgery | Admitting: Surgery

## 2018-01-23 ENCOUNTER — Ambulatory Visit: Payer: Medicare Other | Admitting: Physician Assistant

## 2018-01-23 DIAGNOSIS — Z9889 Other specified postprocedural states: Secondary | ICD-10-CM | POA: Insufficient documentation

## 2018-01-23 DIAGNOSIS — Z95820 Peripheral vascular angioplasty status with implants and grafts: Secondary | ICD-10-CM | POA: Diagnosis not present

## 2018-01-23 DIAGNOSIS — I771 Stricture of artery: Secondary | ICD-10-CM | POA: Insufficient documentation

## 2018-01-23 DIAGNOSIS — I70233 Atherosclerosis of native arteries of right leg with ulceration of ankle: Secondary | ICD-10-CM | POA: Diagnosis not present

## 2018-01-23 DIAGNOSIS — I739 Peripheral vascular disease, unspecified: Secondary | ICD-10-CM

## 2018-01-26 ENCOUNTER — Ambulatory Visit (INDEPENDENT_AMBULATORY_CARE_PROVIDER_SITE_OTHER): Payer: Medicare Other | Admitting: Surgery

## 2018-01-26 ENCOUNTER — Encounter: Payer: Self-pay | Admitting: Surgery

## 2018-01-26 ENCOUNTER — Encounter: Payer: Self-pay | Admitting: Infectious Disease

## 2018-01-26 ENCOUNTER — Other Ambulatory Visit: Payer: Self-pay

## 2018-01-26 ENCOUNTER — Ambulatory Visit (INDEPENDENT_AMBULATORY_CARE_PROVIDER_SITE_OTHER): Payer: Medicare Other | Admitting: Infectious Disease

## 2018-01-26 VITALS — BP 114/70 | HR 85 | Temp 97.8°F | Ht 67.0 in | Wt 169.5 lb

## 2018-01-26 VITALS — BP 106/71 | HR 85 | Temp 97.7°F | Resp 20 | Ht 67.0 in | Wt 170.0 lb

## 2018-01-26 DIAGNOSIS — I70233 Atherosclerosis of native arteries of right leg with ulceration of ankle: Secondary | ICD-10-CM

## 2018-01-26 DIAGNOSIS — T82898S Other specified complication of vascular prosthetic devices, implants and grafts, sequela: Secondary | ICD-10-CM | POA: Diagnosis not present

## 2018-01-26 DIAGNOSIS — M86471 Chronic osteomyelitis with draining sinus, right ankle and foot: Secondary | ICD-10-CM | POA: Diagnosis not present

## 2018-01-26 DIAGNOSIS — L97311 Non-pressure chronic ulcer of right ankle limited to breakdown of skin: Secondary | ICD-10-CM

## 2018-01-26 DIAGNOSIS — I7092 Chronic total occlusion of artery of the extremities: Secondary | ICD-10-CM | POA: Diagnosis not present

## 2018-01-26 DIAGNOSIS — R195 Other fecal abnormalities: Secondary | ICD-10-CM | POA: Diagnosis not present

## 2018-01-26 DIAGNOSIS — J3489 Other specified disorders of nose and nasal sinuses: Secondary | ICD-10-CM

## 2018-01-26 DIAGNOSIS — I739 Peripheral vascular disease, unspecified: Secondary | ICD-10-CM

## 2018-01-26 DIAGNOSIS — M86661 Other chronic osteomyelitis, right tibia and fibula: Secondary | ICD-10-CM

## 2018-01-26 DIAGNOSIS — I7025 Atherosclerosis of native arteries of other extremities with ulceration: Secondary | ICD-10-CM

## 2018-01-26 MED ORDER — DOXYCYCLINE HYCLATE 100 MG PO TABS: 100.0000 mg | ORAL_TABLET | Freq: Two times a day (BID) | ORAL | 4 refills | Status: DC

## 2018-01-26 NOTE — Progress Notes (Signed)
Patient name: [name] MRN: [id number] DOB: 06-13-1966 Sex: female  REASON FOR VISIT:     post op  HISTORY OF PRESENT ILLNESS:   [name] a 51y.o.femalewho presents for follow-up.  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 She has had multiple interventions for recurrent stenosis. She has also undergone balloon angioplasty of her right subclavian artery for upper extremity numbness.  She has a chronic right leg wound with known osteomyelitis.  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  Because of the infectious issues, the patient's revascularization is been delayed.  Ultimately on 11/28/2017 she underwent an aortobifemoral bypass graft, and thrombectomy and bilateral femoral-popliteal bypass grafts.  She had to go back to the operating room the following day for acute left leg ischemia and had a left popliteal and tibioperoneal trunk endarterectomy with lesser saphenous vein patch.  CURRENT MEDICATIONS:    Current Outpatient Medications  Medication Sig Dispense Refill  . albuterol (PROVENTIL) 2 MG tablet Take 2 mg by mouth 3 (three) times daily as needed for shortness of breath.     . cetirizine (ZYRTEC) 10 MG tablet Take 10 mg by mouth daily as needed for allergies.     Marland Kitchen doxycycline (VIBRA-TABS) 100 MG tablet Take 1 tablet (100 mg total) by mouth 2 (two) times daily. 60 tablet 4  . HUMALOG KWIKPEN 100 UNIT/ML KiwkPen Inject 10 Units into the skin See admin instructions. 10 units three times a day before meals per sliding scale    . HYDROcodone-acetaminophen (NORCO) 10-325 MG tablet Take 1 tablet by mouth 2 (two) times daily.     Marland Kitchen LANTUS SOLOSTAR 100 UNIT/ML Solostar Pen Inject 36 Units into the skin 2 (two) times daily.     . metoprolol tartrate (LOPRESSOR) 25 MG tablet Take 0.5 tablets (12.5 mg total) by mouth 2 (two) times daily. 90 tablet 3  . omeprazole  (PRILOSEC) 40 MG capsule Take 40 mg by mouth daily.    . ondansetron (ZOFRAN ODT) 4 MG disintegrating tablet Take 1 tablet (4 mg total) by mouth every 8 (eight) hours as needed for nausea or vomiting. 20 tablet 0  . pregabalin (LYRICA) 100 MG capsule Limit 1 tab by mouth twice a day to 3 times a day if tolerated (Patient taking differently: Take 100 mg by mouth 3 (three) times daily. ) 90 capsule 0  . rivaroxaban (XARELTO) 20 MG TABS tablet Take 1 tablet (20 mg total) by mouth daily with supper. 30 tablet 2  . rosuvastatin (CRESTOR) 20 MG tablet Take 1 tablet (20 mg total) by mouth daily at 6 PM. 30 tablet 2  . dicyclomine (BENTYL) 10 MG capsule Take 10 mg by mouth 4 (four) times daily as needed for spasms.     No current facility-administered medications for this visit.     REVIEW OF SYSTEMS:   [X]  denotes positive finding, [ ]  denotes negative finding Cardiac  Comments:  Chest pain or chest pressure:    Shortness of breath upon exertion:    Short of breath when lying flat:    Irregular heart rhythm:    Constitutional    Fever or chills:      PHYSICAL EXAM:   Vitals:   01/26/18 1507  BP: 106/71  Pulse: 85  Resp: 20  Temp: 97.7 F (36.5 C)  TempSrc: Oral  SpO2: 97%  Weight: 170 lb (77.1 kg)  Height: 5\' 7"  (1.702 m)    GENERAL:  The patient is a well-nourished female, in no acute distress. The vital signs are documented above. CARDIOVASCULAR: There is a regular rate and rhythm. PULMONARY: Non-labored respirations See picture below for dry eschar on the bottom of the left foot.  She has palpable femoral pulses.  Her incisions have healed nicely.  She continues to have a chronic sacral wound and right lower extremity wounds x2      STUDIES:   I reviewed her vascular lab studies.  Radio the right is 0.9.  On the left is 0.58.  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  On the left there is a outflow stenosis greater than 70%.   MEDICAL ISSUES:    Status post aortobifemoral bypass graft: Patient is recovering nicely from her operation.  Unfortunately she continues to have nonhealing wound on her sacrum and right leg as well as dry eschar on the plantar surface to her left foot.  With regards to the left leg, I will let the wounds continue to demarcate as there is no evidence of infection and these are very dry.  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  I think this is going to need to be addressed but because she is still recovering from her operation I wait another 6 weeks.  She will follow-up with me in the office in 6 weeks we will repeat her duplex and then most likely schedule her for intervention to the tibial vessels on the left.  She potentially could need to have this done through a brachial approach, however she does have left subclavian issues.  [name], MD [facility] [phone] Pager [phone]

## 2018-01-26 NOTE — Progress Notes (Signed)
Subjective:    Cc: followup for chronic wounds and osteo in ankl    Patient ID: [name], female    DOB: August 11, 1966, 52 y.o.   MRN: [id number]  HPI  52 y.o. female with  diabetic neuropathy diabetic foot ulcer peripheral vascular disease status post bypass surgery with evidence of osteomyelitis on MRI in the [facility] in February of 2018. She also has a sacral wound that is not healed up completely where she had prior necrotizing fasciitis.I saw her in the hospital and she did not want to undergo surgical intervention. She tells me that Dr Sharol Given is concerned that if he performs at biopsy or debridement on this call it will lead to her losing her leg. I have indeed concerned that she mailed to my need a below the knee or above the knee and dictation to cure osteomyelitis at this site. I proposed the idea of her going on empiric IV antibiotics but she was not very eager to do that initially . Therefore I initially placed her  doxycycline twice daily which she has come off of now and also Augmentin. She had trouble tolerating this regimen with nausea and diarrhea. She ultimately decided to try IV abbx  We then placed her on IV vancomycin and ceftriaxone x 6 weeks. NOTE her vancomycin levels were very haphazard suggesting that she was not optimally or consistently compliant with her antibiotics.  She claimed that her wound on her foot was healing up somewhat. She ONCE AGAIN at that visit when I last saw her did  NOT want me to examine her sacral wound. This previously was where she had Nec fascitis but was not to bone--though wound care classifies as STAGE IV.  We  switched her to DS Bactrim 2 BID since finishing her abx.  Her ESR and CRP remain elevated at 76 and 2.8.  I again discussed that she would need amputation to cure her osteo in her fibula but that we could try to continue suppressive antibiotics.   She he has continued to follow with Dr. Con Memos with wound care   She is being  evaluated by Dr. Trula Slade for vascular surgery. He would be understandably concerned re risk of her ankle infection spreading to blood stream and to endovascular space as well as the challenge posed by location of her thigh wound.  When we checked plain films now are also showing osteo in the ankle. She had : #1 arch aortogram #2 abdominal aortogram with bilateral lower extremity runoff by Dr. Patience Musca on December 14th, 2018l.  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   She has noticed increasing pain in opposite foot and leg where foot goes cold and she says "I have poor blood flow"  She is now sp vacular surgery and being followed closely by VVS and WOund care. She has some blackened areas on toes of her left foot that per wound care office are improving. She still has pain on her buttocks but ulcer there is smaller as is on her ankle.  She does have loose stools if she does not take imodium.  She states she tested negative for CDI.  She is c/o having had sinus pressure and pain on left side of face and asked for different abx for this though this pain is resolved now.   Past Medical History:  Diagnosis Date  . Arthritis   . Asthma   . Chronic back pain   . Depression   . Diabetes mellitus   . Diabetic neuropathy ([place])   . Family history of adverse reaction to anesthesia    mother had difficlty waking   . 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 GERD (gastroesophageal reflux disease)   . Hyperlipidemia   .  Osteomyelitis of right fibula ([place]) 03/05/2017  . PAD (peripheral artery disease) ([place])   . Paroxysmal atrial fibrillation with rapid ventricular response ([place]) 12/02/2017  . Ulcer    Foot    Past Surgical History:  Procedure Laterality Date  . ABDOMINAL AORTAGRAM  June 15, 2014  . ABDOMINAL AORTAGRAM N/A 06/15/2014   Procedure: ABDOMINAL Maxcine Ham;  Surgeon: Serafina Mitchell, MD;  Location: [facility] CATH LAB;  Service: Cardiovascular;  Laterality: N/A;  . ABDOMINAL AORTAGRAM N/A 11/22/2014   Procedure: ABDOMINAL AORTAGRAM;  Surgeon: Serafina Mitchell, MD;  Location: [facility] CATH LAB;  Service: Cardiovascular;  Laterality: N/A;  . ABDOMINAL AORTOGRAM W/LOWER EXTREMITY N/A 01/07/2017   Procedure: Abdominal Aortogram w/Lower Extremity;  Surgeon: Serafina Mitchell, MD;  Location: [place] CV LAB;  Service: Cardiovascular;  Laterality: N/A;  . ABDOMINAL AORTOGRAM W/LOWER EXTREMITY N/A 10/31/2017   Procedure: ABDOMINAL AORTOGRAM W/LOWER EXTREMITY;  Surgeon: Elam Dutch, MD;  Location: [place] CV LAB;  Service: Cardiovascular;  Laterality: N/A;  . AORTA - BILATERAL FEMORAL ARTERY BYPASS GRAFT N/A 11/28/2017   Procedure: AORTA BIFEMORAL BYPASS USING HEMASHIELD GOLD GRAFT & REIMPLANT IMA;  Surgeon: Serafina Mitchell, MD;  Location: [facility] OR;  Service: Vascular;  Laterality: N/A;  . AORTIC ARCH ANGIOGRAPHY N/A 10/31/2017   Procedure: AORTIC ARCH ANGIOGRAPHY;  Surgeon: Elam Dutch, MD;  Location: [place] CV LAB;  Service: Cardiovascular;  Laterality: N/A;  . APPLICATION OF WOUND VAC  11/28/2017   Procedure: APPLICATION OF WOUND VAC;  Surgeon: Serafina Mitchell, MD;  Location: [facility] OR;  Service: Vascular;;  . ARTERIAL BYPASS SURGRY  07/05/2010   Right Common Femoral to below knee popliteal BPG  . BACK SURGERY     X's  2  . CARDIAC CATHETERIZATION    . CHOLECYSTECTOMY     Gall Bladder  . CYSTECTOMY Right    foot  . CYSTECTOMY Left    wrist  . EMBOLECTOMY Left 11/28/2017   Procedure: Left Lower  Extremity Embolectomy, Left Tibial Peroneal Trunk Endarterectomy with Patch Angioplasty; Vein Harvest Small Saphenous Graft Left Lower Leg;  Surgeon: [name], MD;  Location: [place];  Service: Vascular;  Laterality: Left;  . INTERCOSTAL NERVE BLOCK  November 2015  . INTRAOPERATIVE ARTERIOGRAM  11/28/2017   Procedure: INTRA OPERATIVE ARTERIOGRAM OF LEFT LEG;  Surgeon: Serafina Mitchell, MD;  Location: [place];  Service: Vascular;;  . IR FLUORO GUIDE CV LINE RIGHT  03/20/2017  . IR US GUIDE VASC ACCESS RIGHT  03/20/2017  . IRRIGATION AND DEBRIDEMENT BUTTOCKS Right 09/30/2016   Procedure: DEBRIDEMENT RIGHT  BUTTOCK WOUND;  Surgeon: Georganna Skeans, MD;  Location: [place];  Service: General;  Laterality: Right;  . left foot surgery    . left wrist cyst removal Left   . PERIPHERAL VASCULAR CATHETERIZATION N/A 05/07/2016   Procedure: Abdominal  Aortogram;  Surgeon: Serafina Mitchell, MD;  Location: [place] CV LAB;  Service: Cardiovascular;  Laterality: N/A;  . PERIPHERAL VASCULAR CATHETERIZATION N/A 05/07/2016   Procedure: Lower Extremity Angiography;  Surgeon: Serafina Mitchell, MD;  Location: [place] CV LAB;  Service: Cardiovascular;  Laterality: N/A;  . PERIPHERAL VASCULAR CATHETERIZATION N/A 05/07/2016   Procedure: Aortic Arch Angiography;  Surgeon: Serafina Mitchell, MD;  Location: [place] CV LAB;  Service: Cardiovascular;  Laterality: N/A;  . PERIPHERAL VASCULAR CATHETERIZATION N/A 05/07/2016   Procedure: Upper Extremity Angiography;  Surgeon: Serafina Mitchell, MD;  Location: [place] CV LAB;  Service: Cardiovascular;  Laterality: N/A;  . PERIPHERAL VASCULAR CATHETERIZATION Right 05/07/2016   Procedure: Peripheral Vascular Balloon Angioplasty;  Surgeon: Serafina Mitchell, MD;  Location: [place] CV LAB;  Service: Cardiovascular;  Laterality: Right;  subclavian  . PERIPHERAL VASCULAR CATHETERIZATION Right 05/07/2016   Procedure: Peripheral Vascular Intervention;  Surgeon: Serafina Mitchell,  MD;  Location: [place] CV LAB;  Service: Cardiovascular;  Laterality: Right;  External  Iliac  . SKIN GRAFT Right 2012   [name] by Dr. Nils Pyle- Right and Left Ankle  . SPINE SURGERY    . THROMBECTOMY FEMORAL ARTERY  11/28/2017   Procedure: THROMBECTOMY  & REVISION OF BILATERAL FEMORAL TO POPLETEAL ARTERIES;  Surgeon: Serafina Mitchell, MD;  Location: [facility] OR;  Service: Vascular;;  . TONSILLECTOMY      Family History  Problem Relation Age of Onset  . Coronary artery disease Mother   . Peripheral vascular disease Mother   . Heart disease Mother        Before age 49  . Other Mother        Venous insuffiency  . Diabetes Mother   . Hyperlipidemia Mother   . Hypertension Mother   . Varicose Veins Mother   . Heart attack Mother        before age 34  . Heart disease Father   . Diabetes Father   . Diabetes Maternal Grandmother   . Diabetes Paternal Grandmother   . Diabetes Paternal Grandfather   . Diabetes Sister   . Hypertension Sister   . Diabetes Brother   . Hypertension Brother       Social History   Socioeconomic History  . Marital status: Married    Spouse name: Not on file  . Number of children: Not on file  . Years of education: Not on file  . Highest education level: Not on file  Social Needs  . Financial resource strain: Not on file  . Food insecurity - worry: Not on file  . Food insecurity - inability: Not on file  . Transportation needs - medical: Not on file  . Transportation needs - non-medical: Not on file  Occupational History  . Occupation: disabled  Tobacco Use  . Smoking status: Former Smoker    Years: 30.00    Types: Cigarettes    Last attempt to quit: 02/07/2017    Years since quitting: 0.9  . Smokeless tobacco: Never Used  Substance and Sexual Activity  . Alcohol use: No    Alcohol/week: 0.0 oz  . Drug use: No  . Sexual activity: Yes    Birth control/protection: Post-menopausal  Other Topics Concern  . Not on file  Social History Narrative    . Not on file    No Known Allergies   Current Outpatient Medications:  .  albuterol (PROVENTIL) 2 MG tablet, Take 2 mg by mouth 3 (three)  times daily as needed for shortness of breath. , Disp: , Rfl:  .  cetirizine (ZYRTEC) 10 MG tablet, Take 10 mg by mouth daily as needed for allergies. , Disp: , Rfl:  .  dicyclomine (BENTYL) 10 MG capsule, Take 10 mg by mouth 4 (four) times daily as needed for spasms., Disp: , Rfl:  .  doxycycline (VIBRA-TABS) 100 MG tablet, Take 1 tablet (100 mg total) by mouth 2 (two) times daily. (Patient not taking: Reported on 01/14/2018), Disp: 60 tablet, Rfl: 5 .  HUMALOG KWIKPEN 100 UNIT/ML KiwkPen, Inject 10 Units into the skin See admin instructions. 10 units three times a day before meals per sliding scale, Disp: , Rfl:  .  HYDROcodone-acetaminophen (NORCO) 10-325 MG tablet, Take 1 tablet by mouth 2 (two) times daily. , Disp: , Rfl:  .  LANTUS SOLOSTAR 100 UNIT/ML Solostar Pen, Inject 36 Units into the skin 2 (two) times daily. , Disp: , Rfl:  .  metoprolol tartrate (LOPRESSOR) 25 MG tablet, Take 0.5 tablets (12.5 mg total) by mouth 2 (two) times daily., Disp: 90 tablet, Rfl: 3 .  omeprazole (PRILOSEC) 40 MG capsule, Take 40 mg by mouth daily., Disp: , Rfl:  .  ondansetron (ZOFRAN ODT) 4 MG disintegrating tablet, Take 1 tablet (4 mg total) by mouth every 8 (eight) hours as needed for nausea or vomiting., Disp: 20 tablet, Rfl: 0 .  pregabalin (LYRICA) 100 MG capsule, Limit 1 tab by mouth twice a day to 3 times a day if tolerated (Patient taking differently: Take 100 mg by mouth 3 (three) times daily. ), Disp: 90 capsule, Rfl: 0 .  rivaroxaban (XARELTO) 20 MG TABS tablet, Take 1 tablet (20 mg total) by mouth daily with supper., Disp: 30 tablet, Rfl: 2 .  rosuvastatin (CRESTOR) 20 MG tablet, Take 1 tablet (20 mg total) by mouth daily at 6 PM., Disp: 30 tablet, Rfl: 2    Review of Systems  Constitutional: Negative for chills and fever.  HENT: Positive for facial  swelling and postnasal drip. Negative for congestion and sore throat.   Eyes: Negative for photophobia.  Respiratory: Negative for shortness of breath and wheezing.   Cardiovascular: Negative for chest pain, palpitations and leg swelling.  Gastrointestinal: Negative for abdominal pain, blood in stool, constipation, nausea and vomiting.  Genitourinary: Negative for dysuria, flank pain and hematuria.  Musculoskeletal: Positive for arthralgias. Negative for back pain.  Skin: Positive for color change and wound. Negative for rash.  Neurological: Negative for dizziness, weakness and headaches.  Hematological: Does not bruise/bleed easily.  Psychiatric/Behavioral: Negative for suicidal ideas.       Objective:   Physical Exam  Constitutional: She is oriented to person, place, and time. She appears well-developed and well-nourished. No distress.  HENT:  Head: Normocephalic and atraumatic.  Mouth/Throat: No oropharyngeal exudate.  Eyes: Conjunctivae and EOM are normal. No scleral icterus.  Neck: Normal range of motion. Neck supple.  Cardiovascular: Normal rate and regular rhythm. Exam reveals no gallop and no friction rub.  No murmur heard. Pulmonary/Chest: Effort normal and breath sounds normal. No respiratory distress. She has no wheezes.  Abdominal: She exhibits no distension.  Musculoskeletal: She exhibits no edema or tenderness.  Neurological: She is alert and oriented to person, place, and time. She exhibits normal muscle tone. Coordination normal.  Skin: Skin is warm. She is not diaphoretic. There is erythema.  Psychiatric: She has a normal mood and affect. Her behavior is normal. Judgment and thought content normal.  Nursing note  and vitals reviewed.  Wound over fibula 03/05/17: some purulent material from wound     Same wound6/25/18:        08/12/17: purulent material in dressing      11/19/17:     01/26/18:       Thigh wound  08/12/17:        01/26/18:      Left foot 11/19/17:     Left foot 01/26/18:        Assessment & Plan:   Chronic wound over ankle with osteomyelitis of the fibula: from outside appears to be improving. Will continue her doxycycline for now and continue WOC and VVS followup    Chronic wound over her buttocks, better   Peripheral vascular disease post revascularization: she has cyanotic lesions on her toes on the left but these are better per wound care. To see VVS very soon  Sinus pain: again likely another viral URI  Loose stools: ok for her to take imodium if needed. She claims stoppoing doxy doesn't help?  Diabetes mellitus: Poorly controlled .  Marland Kitchen

## 2018-01-27 ENCOUNTER — Other Ambulatory Visit: Payer: Self-pay

## 2018-01-27 DIAGNOSIS — I739 Peripheral vascular disease, unspecified: Secondary | ICD-10-CM

## 2018-01-30 ENCOUNTER — Encounter: Payer: Medicare Other | Admitting: Physician Assistant

## 2018-01-30 DIAGNOSIS — E11622 Type 2 diabetes mellitus with other skin ulcer: Secondary | ICD-10-CM | POA: Diagnosis not present

## 2018-02-01 NOTE — Progress Notes (Signed)
ESME, DURKIN ([id number]) Visit Report for 01/30/2018 Arrival Information Details Patient Name: [name], [name]. Date of Service: 01/30/2018 1:00 PM Medical Record Number: [id number] Patient Account Number: [id number] Date of Birth/Sex: [birth year] (52 y.o. Female) Treating RN: Roger Shelter Primary Care Elai Vanwyk: Delight Stare Other Clinician: Referring Royalti Schauf: Delight Stare Treating Ember Gottwald/Extender: Melburn Hake, HOYT Weeks in Treatment: 6 Visit Information History Since Last Visit All ordered tests and consults were completed: No Patient Arrived: Gilford Rile Added or deleted any medications: No Arrival Time: 13:16 Any new allergies or adverse reactions: No Accompanied By: self Had a fall or experienced change in No Transfer Assistance: None activities of daily living that may affect Patient Identification Verified: Yes risk of falls: Secondary Verification Process Yes Signs or symptoms of abuse/neglect since last visito No Completed: Hospitalized since last visit: No Patient Requires Transmission-Based No Pain Present Now: No Precautions: Patient Has Alerts: Yes Patient Alerts: Patient on Blood Thinner DM II Xarelto Electronic Signature(s) Signed: 01/30/2018 3:21:12 PM By: Roger Shelter Entered By: Roger Shelter on 01/30/2018 13:19:37 Whitmore, [name] ([id number]) -------------------------------------------------------------------------------- Encounter Discharge Information Details Patient Name: [name]. Date of Service: 01/30/2018 1:00 PM Medical Record Number: [id number] Patient Account Number: [id number] Date of Birth/Sex: September 28, 1966 (52 y.o. Female) Treating RN: Carolyne Fiscal, Debi Primary Care Achille Xiang: Delight Stare Other Clinician: Referring Rheta Hemmelgarn: Delight Stare Treating Anella Nakata/Extender: Melburn Hake, HOYT Weeks in Treatment: 6 Encounter Discharge Information Items Discharge Pain Level: 0 Discharge Condition: Stable Ambulatory Status: Walker Discharge  Destination: Home Private Transportation: Auto Accompanied By: self Schedule Follow-up Appointment: Yes Medication Reconciliation completed and provided No to Patient/Care Timber Marshman: Clinical Summary of Care: Electronic Signature(s) Signed: 01/30/2018 3:16:46 PM By: Alric Quan Entered By: Alric Quan on 01/30/2018 14:32:43 Lafauci, Jylian S. ([id number]) -------------------------------------------------------------------------------- Lower Extremity Assessment Details Patient Name: [name]. Date of Service: 01/30/2018 1:00 PM Medical Record Number: [id number] Patient Account Number: [id number] Date of Birth/Sex: [birth year] (52 y.o. Female) Treating RN: Roger Shelter Primary Care Nathanuel Cabreja: Delight Stare Other Clinician: Referring Donzell Coller: Delight Stare Treating Tyrik Stetzer/Extender: STONE III, HOYT Weeks in Treatment: 6 Edema Assessment Assessed: [Left: No] [Right: No] Edema: [Left: No] [Right: No] Vascular Assessment Claudication: Claudication Assessment [Left:None] [Right:None] Pulses: Dorsalis Pedis Palpable: [Left:Yes] [Right:Yes] Posterior Tibial Extremity colors, hair growth, and conditions: Extremity Color: [Left:Normal] [Right:Normal] Hair Growth on Extremity: [Left:No] [Right:No] Temperature of Extremity: [Left:Warm] [Right:Warm] Capillary Refill: [Left:< 3 seconds] [Right:< 3 seconds] Toe Nail Assessment Left: Right: Thick: Yes Yes Discolored: Yes Yes Deformed: Yes Yes Improper Length and Hygiene: Yes Yes Electronic Signature(s) Signed: 01/30/2018 3:21:12 PM By: Roger Shelter Entered By: Roger Shelter on 01/30/2018 13:37:53 Armand, Jonnelle S. ([id number]) -------------------------------------------------------------------------------- Multi Wound Chart Details Patient Name: [name]. Date of Service: 01/30/2018 1:00 PM Medical Record Number: [id number] Patient Account Number: [id number] Date of Birth/Sex: [birth year] (52 y.o.  Female) Treating RN: Montey Hora Primary Care Thressa Shiffer: Delight Stare Other Clinician: Referring Danyle Boening: Delight Stare Treating Gladstone Rosas/Extender: STONE III, HOYT Weeks in Treatment: 6 Vital Signs Height(in): 67 Pulse(bpm): 31 Weight(lbs): Blood Pressure(mmHg): 104/63 Body Mass Index(BMI): Temperature(F): 98.2 Respiratory Rate 18 (breaths/min): Photos: [10:No Photos] [11:No Photos] [3:No Photos] Wound Location: [10:Left Toe Fifth - Circumfernential] [11:Right Lower Leg - Medial] [3:Right Malleolus - Lateral] Wounding Event: [10:Gradually Appeared] [11:Trauma] [3:Pressure Injury] 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] [11:Trauma, Other] [3:Diabetic Wound/Ulcer of the Lower Extremity] Comorbid History: [10:Anemia, Arrhythmia, Coronary Artery Disease, Hypotension, Peripheral Arterial Disease, Type II Diabetes, Osteoarthritis] [11:Anemia, Arrhythmia, Coronary Anemia, Arrhythmia, Coronary Artery Disease, Hypotension, Artery Disease,  Hypotension, Peripheral  Arterial Disease, Peripheral Arterial Disease, Type II Diabetes, Osteoarthritis] [3:Type II Diabetes, Osteoarthritis] Date Acquired: [10:11/27/2017] [11:01/12/2018] [3:09/23/2016] Weeks of Treatment: [10:4] [11:2] [3:6] Wound Status: [10:Open] [11:Open] [3:Open] Pending Amputation on [10:No] [11:No] [3:No] Presentation: Measurements L x W x D [10:2x1.5x0.1] [11:1.2x0.8x0.1] [3:0.6x0.6x0.2] (cm) Area (cm) : [10:2.356] [11:0.754] [3:0.283] Volume (cm) : [10:0.236] [11:0.075] [3:0.057] % Reduction in Area: [10:75.00%] [11:46.70%] [3:66.60%] % Reduction in Volume: [10:74.90%] [11:46.80%] [3:66.50%] Classification: [10:Grade 1] [11:Full Thickness Without Exposed Support Structures] [3:Grade 2] Exudate Amount: [10:None Present] [11:None Present] [3:Large] Exudate Type: [10:N/A] [11:N/A] [3:Serous] Exudate Color: [10:N/A] [11:N/A] [3:amber] Wound Margin: [10:Distinct, outline attached] [11:Flat and  Intact] [3:Distinct, outline attached] Granulation Amount: [10:None Present (0%)] [11:Large (67-100%)] [3:Medium (34-66%)] Granulation Quality: [10:N/A] [11:Red] [3:Pink] Necrotic Amount: [10:Large (67-100%)] [11:Small (1-33%)] [3:Medium (34-66%)] Necrotic Tissue: [10:Eschar] [11:Eschar] [3:Eschar, Adherent Slough] Exposed Structures: [10:Fascia: No Fat Layer (Subcutaneous Tissue) Exposed: No Tendon: No Muscle: No] [11:Fascia: No Fat Layer (Subcutaneous Tissue) Exposed: No Tendon: No Muscle: No] [3:Fat Layer (Subcutaneous Tissue) Exposed: Yes Fascia: No Tendon: No Muscle: No] Joint: No Joint: No Joint: No Bone: No Bone: No Bone: No Epithelialization: None Small (1-33%) None Periwound Skin Texture: Excoriation: No Excoriation: No Excoriation: No Induration: No Induration: No Induration: No Callus: No Callus: No Callus: No Crepitus: No Crepitus: No Crepitus: No Rash: No Rash: No Rash: No Scarring: No Scarring: No Scarring: No Periwound Skin Moisture: Dry/Scaly: Yes Maceration: No Maceration: No Maceration: No Dry/Scaly: No Dry/Scaly: No Periwound Skin Color: Atrophie Blanche: No Atrophie Blanche: No Atrophie Blanche: No Cyanosis: No Cyanosis: No Cyanosis: No Ecchymosis: No Ecchymosis: No Ecchymosis: No Erythema: No Erythema: No Erythema: No Hemosiderin Staining: No Hemosiderin Staining: No Hemosiderin Staining: No Mottled: No Mottled: No Mottled: No Pallor: No Pallor: No Pallor: No Rubor: No Rubor: No Rubor: No Temperature: No Abnormality No Abnormality No Abnormality Tenderness on Palpation: Yes Yes No Wound Preparation: Ulcer Cleansing: Ulcer Cleansing: Ulcer Cleansing: Rinsed/Irrigated with Saline Rinsed/Irrigated with Saline Rinsed/Irrigated with Saline Topical Anesthetic Applied: Topical Anesthetic Applied: Topical Anesthetic Applied: None None None Wound Number: [id number]  Photos: No Photos No Photos No Photos Wound Location: Left Calcaneus -  Lateral Left Toe Great - Left Toe Fourth - Circumfernential Circumfernential Wounding Event: Blister Gradually Appeared Gradually Appeared 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 Lower Extremity Lower Extremity Comorbid History: Anemia, Arrhythmia, Coronary Anemia, Arrhythmia, Coronary Anemia, Arrhythmia, Coronary Artery Disease, Hypotension, Artery Disease, Hypotension, Artery Disease, Hypotension, Peripheral Arterial Disease, Peripheral Arterial Disease, Peripheral Arterial Disease, Type II Diabetes, Type II Diabetes, Type II Diabetes, Osteoarthritis Osteoarthritis Osteoarthritis Date Acquired: 11/27/2017 11/27/2017 11/27/2017 Weeks of Treatment: 6 6 6  Wound Status: Open Open Open Pending Amputation on Yes Yes Yes Presentation: Measurements L x W x D 1.1x1.5x0.1 2.6x2x0.1 2x2.2x0.1 (cm) Area (cm) : 1.296 4.084 3.456 Volume (cm) : 0.13 0.408 0.346 % Reduction in Area: 92.00% 86.40% 26.70% % Reduction in Volume: 96.00% 86.40% 26.50% Classification: Grade 2 Grade 2 Grade 2 Exudate Amount: Medium None Present Large Exudate Type: Serosanguineous N/A Serosanguineous Exudate Color: red, brown N/A red, brown Wound Margin: Distinct, outline attached Distinct, outline attached Distinct, outline attached Granulation Amount: Small (1-33%) None Present (0%) None Present (0%) Granulation Quality: Pink N/A N/A Necrotic Amount: Large (67-100%) Large (67-100%) Large (67-100%) Tapia, Curtisha S. ([id number]) Necrotic Tissue: Eschar Eschar Eschar, Adherent Slough Exposed Structures: Fat Layer (Subcutaneous N/A Fascia: No Tissue) Exposed: Yes Fat Layer (Subcutaneous Fascia: No Tissue) Exposed: No Tendon: No Tendon: No Muscle: No  Muscle: No Joint: No Joint: No Bone: No Bone: No Epithelialization: N/A None None Periwound Skin Texture: Callus: Yes Excoriation: No Excoriation: No Excoriation: No Induration:  No Induration: No Induration: No Callus: No Callus: No Crepitus: No Crepitus: No Crepitus: No Rash: No Rash: No Rash: No Scarring: No Scarring: No Scarring: No Periwound Skin Moisture: Dry/Scaly: Yes Maceration: No Dry/Scaly: Yes Maceration: No Dry/Scaly: No Maceration: No Periwound Skin Color: Atrophie Blanche: No Atrophie Blanche: No Atrophie Blanche: No Cyanosis: No Cyanosis: No Cyanosis: No Ecchymosis: No Ecchymosis: No Ecchymosis: No Erythema: No Erythema: No Erythema: No Hemosiderin Staining: No Hemosiderin Staining: No Hemosiderin Staining: No Mottled: No Mottled: No Mottled: No Pallor: No Pallor: No Pallor: No Rubor: No Rubor: No Rubor: No Temperature: No Abnormality No Abnormality No Abnormality Tenderness on Palpation: No No No Wound Preparation: Ulcer Cleansing: Ulcer Cleansing: Ulcer Cleansing: Rinsed/Irrigated with Saline Rinsed/Irrigated with Saline Rinsed/Irrigated with Saline Topical Anesthetic Applied: Topical Anesthetic Applied: Topical Anesthetic Applied: None None None Wound Number: 7 [id number] N/A Photos: No Photos No Photos N/A Wound Location: Right Gluteus Left Foot - Lateral N/A Wounding Event: Gradually Appeared Gradually Appeared N/A Primary Etiology: Pressure Ulcer Diabetic Wound/Ulcer of the N/A Lower Extremity Comorbid History: Anemia, Arrhythmia, Coronary Anemia, Arrhythmia, Coronary N/A Artery Disease, Hypotension, Artery Disease, Hypotension, Peripheral Arterial Disease, Peripheral Arterial Disease, Type II Diabetes, Type II Diabetes, Osteoarthritis Osteoarthritis Date Acquired: 09/18/2016 11/27/2017 N/A Weeks of Treatment: 6 6 N/A Wound Status: Open Open N/A Pending Amputation on No Yes N/A Presentation: Measurements L x W x D 1.1x0.6x0.5 1.8x2.1x0.1 N/A (cm) Area (cm) : 0.518 2.969 N/A Volume (cm) : 0.259 0.297 N/A % Reduction in Area: 65.30% 97.00% N/A % Reduction in Volume: 75.20% 97.00% N/A Classification:  Category/Stage IV Grade 2 N/A Exudate Amount: Large Medium N/A Exudate Type: Serosanguineous Serosanguineous N/A Andaya, Daylynn S. ([id number]) Exudate Color: red, brown red, brown N/A Wound Margin: Distinct, outline attached Distinct, outline attached N/A Granulation Amount: Large (67-100%) None Present (0%) N/A Granulation Quality: Red N/A N/A Necrotic Amount: Small (1-33%) Large (67-100%) N/A Necrotic Tissue: Adherent Slough Eschar N/A Exposed Structures: Fat Layer (Subcutaneous Fat Layer (Subcutaneous N/A Tissue) Exposed: Yes Tissue) Exposed: Yes Epithelialization: None None N/A Periwound Skin Texture: Excoriation: Yes Excoriation: No N/A Induration: No Induration: No Callus: No Callus: No Crepitus: No Crepitus: No Rash: No Rash: No Scarring: No Scarring: No Periwound Skin Moisture: Maceration: No Dry/Scaly: Yes N/A Dry/Scaly: No Maceration: No Periwound Skin Color: Atrophie Blanche: No Atrophie Blanche: No N/A Cyanosis: No Cyanosis: No Ecchymosis: No Ecchymosis: No Erythema: No Erythema: No Hemosiderin Staining: No Hemosiderin Staining: No Mottled: No Mottled: No Pallor: No Pallor: No Rubor: No Rubor: No Temperature: No Abnormality No Abnormality N/A Tenderness on Palpation: Yes No N/A Wound Preparation: Ulcer Cleansing: Ulcer Cleansing: N/A Rinsed/Irrigated with Saline Rinsed/Irrigated with Saline Topical Anesthetic Applied: Topical Anesthetic Applied: Other: lidocaine None Treatment Notes Electronic Signature(s) Signed: 01/30/2018 3:20:05 PM By: Montey Hora Entered By: Montey Hora on 01/30/2018 14:01:55 Beitz, [name] ([id number]) -------------------------------------------------------------------------------- [place] Details Patient Name: GAYATHRI, FUTRELL. Date of Service: 01/30/2018 1:00 PM Medical Record Number: [id number] Patient Account Number: [id number] Date of Birth/Sex: [birth year] (52 y.o. Female) Treating RN:  Montey Hora Primary Care Belkis Norbeck: Delight Stare Other Clinician: Referring Kyzer Blowe: Delight Stare Treating Margues Filippini/Extender: Melburn Hake, HOYT Weeks in Treatment: 6 Active Inactive ` Abuse / Safety / Falls / Self Care Management Nursing Diagnoses: Potential for falls Goals: Patient will not experience any injury related to falls Date Initiated: 12/18/2017 Target Resolution  Date: 02/21/2018 Goal Status: Active Interventions: Assess Activities of Daily Living upon admission and as needed Assess fall risk on admission and as needed Assess: immobility, friction, shearing, incontinence upon admission and as needed Assess impairment of mobility on admission and as needed per policy Assess personal safety and home safety (as indicated) on admission and as needed Assess self care needs on admission and as needed Provide education on basic hygiene Provide education on fall prevention Treatment Activities: Education provided on Basic Hygiene : 12/18/2017 Notes: ` Necrotic Tissue Nursing Diagnoses: Impaired tissue integrity related to necrotic/devitalized tissue Knowledge deficit related to management of necrotic/devitalized tissue Goals: Patient/caregiver will verbalize understanding of reason and process for debridement of necrotic tissue Date Initiated: 12/18/2017 Target Resolution Date: 03/28/2018 Goal Status: Active Interventions: Assess patient pain level pre-, during and post procedure and prior to discharge Provide education on necrotic tissue and debridement process Notes: TAMIE, MINTEER ([id number]) ` Nutrition Nursing Diagnoses: Imbalanced nutrition Impaired glucose control: actual or potential Potential for alteratiion in Nutrition/Potential for imbalanced nutrition Goals: Patient/caregiver verbalizes understanding of need to maintain therapeutic glucose control per primary care physician Date Initiated: 12/18/2017 Target Resolution Date: 02/21/2018 Goal Status:  Active Interventions: Assess patient nutrition upon admission and as needed per policy Notes: ` Orientation to the Wound Care Program Nursing Diagnoses: Knowledge deficit related to the wound healing center program Goals: Patient/caregiver will verbalize understanding of the [place] [facility] Date Initiated: 12/18/2017 Target Resolution Date: 12/27/2017 Goal Status: Active Interventions: Provide education on orientation to the wound center Notes: ` Pain, Acute or Chronic Nursing Diagnoses: Pain, acute or chronic: actual or potential Potential alteration in comfort, pain Goals: Patient/caregiver will verbalize adequate pain control between visits Date Initiated: 12/18/2017 Target Resolution Date: 03/28/2018 Goal Status: Active Interventions: Complete pain assessment as per visit requirements Notes: ` Wound/Skin Impairment SCHAE, CANDO ([id number]) Nursing Diagnoses: Impaired tissue integrity Knowledge deficit related to ulceration/compromised skin integrity Goals: Ulcer/skin breakdown will have a volume reduction of 80% by week 12 Date Initiated: 12/18/2017 Target Resolution Date: 03/21/2018 Goal Status: Active Interventions: Assess patient/caregiver ability to perform ulcer/skin care regimen upon admission and as needed Assess ulceration(s) every visit Notes: Electronic Signature(s) Signed: 01/30/2018 3:20:05 PM By: Montey Hora Entered By: Montey Hora on 01/30/2018 14:01:42 Farve, Alfhild S. ([id number]) -------------------------------------------------------------------------------- Pain Assessment Details Patient Name: [name]. Date of Service: 01/30/2018 1:00 PM Medical Record Number: [id number] Patient Account Number: [id number] Date of Birth/Sex: [birth year] (52 y.o. Female) Treating RN: Roger Shelter Primary Care Brenly Trawick: Delight Stare Other Clinician: Referring Aadam Zhen: Delight Stare Treating Maylie Ashton/Extender: Melburn Hake, HOYT Weeks in  Treatment: 6 Active Problems Location of Pain Severity and Description of Pain Patient Has Paino No Site Locations Pain Management and Medication Current Pain Management: Electronic Signature(s) Signed: 01/30/2018 3:21:12 PM By: Roger Shelter Entered By: Roger Shelter on 01/30/2018 13:19:43 Kauzlarich, [name] ([id number]) -------------------------------------------------------------------------------- Patient/Caregiver Education Details Patient Name: [name]. Date of Service: 01/30/2018 1:00 PM Medical Record Number: [id number] Patient Account Number: [id number] Date of Birth/Gender: Dec 04, 1965 (52 y.o. Female) Treating RN: Carolyne Fiscal, Debi Primary Care Physician: Delight Stare Other Clinician: Referring Physician: Delight Stare Treating Physician/Extender: Sharalyn Ink in Treatment: 6 Education Assessment Education Provided To: Patient Education Topics Provided Wound/Skin Impairment: Handouts: Caring for Your Ulcer, Other: change dressing as ordered Methods: Demonstration, Explain/Verbal Responses: State content correctly Electronic Signature(s) Signed: 01/30/2018 3:16:46 PM By: Alric Quan Entered By: Alric Quan on 01/30/2018 14:33:00 Goar, Kasaundra S. ([id number]) -------------------------------------------------------------------------------- Wound Assessment Details Patient Name: Favorite,  [place] S. Date of Service: 01/30/2018 1:00 PM Medical Record Number: [id number] Patient Account Number: [id number] Date of Birth/Sex: [birth year] (52 y.o. Female) Treating RN: Roger Shelter Primary Care Angelic Schnelle: Delight Stare Other Clinician: Referring Romeo Zielinski: Delight Stare Treating Advik Weatherspoon/Extender: STONE III, HOYT Weeks in Treatment: 6 Wound Status Wound Number: 10 Primary Diabetic Wound/Ulcer of the Lower Extremity Etiology: Wound Location: Left Toe Fifth - Circumfernential Wound Open Wounding Event: Gradually Appeared Status: Date Acquired:  11/27/2017 Comorbid Anemia, Arrhythmia, Coronary Artery Disease, Weeks Of Treatment: 4 History: Hypotension, Peripheral Arterial Disease, Type Clustered Wound: No II Diabetes, Osteoarthritis Photos Photo Uploaded By: Roger Shelter on 01/30/2018 15:18:15 Wound Measurements Length: (cm) 2 Width: (cm) 1.5 Depth: (cm) 0.1 Area: (cm) 2.356 Volume: (cm) 0.236 % Reduction in Area: 75% % Reduction in Volume: 74.9% Epithelialization: None Tunneling: No Undermining: No Wound Description Classification: Grade 1 Wound Margin: Distinct, outline attached Exudate Amount: None Present Foul Odor After Cleansing: No Slough/Fibrino Yes Wound Bed Granulation Amount: None Present (0%) Exposed Structure Necrotic Amount: Large (67-100%) Fascia Exposed: No Necrotic Quality: Eschar Fat Layer (Subcutaneous Tissue) Exposed: No Tendon Exposed: No Muscle Exposed: No Joint Exposed: No Bone Exposed: No Periwound Skin Texture Texture Color No Abnormalities Noted: No No Abnormalities Noted: No Gadsden, Kiondra S. ([id number]) Callus: No Atrophie Blanche: No Crepitus: No Cyanosis: No Excoriation: No Ecchymosis: No Induration: No Erythema: No Rash: No Hemosiderin Staining: No Scarring: No Mottled: No Pallor: No Moisture Rubor: No No Abnormalities Noted: No Dry / Scaly: Yes Temperature / Pain Maceration: No Temperature: No Abnormality Tenderness on Palpation: Yes Wound Preparation Ulcer Cleansing: Rinsed/Irrigated with Saline Topical Anesthetic Applied: None Treatment Notes Wound #10 (Left, Circumferential Toe Fifth) 1. Cleansed with: Clean wound with Normal Saline 2. Anesthetic Topical Lidocaine 4% cream to wound bed prior to debridement 4. Dressing Applied: Other dressing (specify in notes) 5. Secondary Dressing Applied ABD Pad Kerlix/Conform 7. Secured with Tape Notes betadine, silvercel Electronic Signature(s) Signed: 01/30/2018 3:21:12 PM By: Roger Shelter Entered  By: Roger Shelter on 01/30/2018 13:43:48 Brymer, Adaleah Chauncey Cruel ([id number]) -------------------------------------------------------------------------------- Wound Assessment Details Patient Name: [name]. Date of Service: 01/30/2018 1:00 PM Medical Record Number: [id number] Patient Account Number: [id number] Date of Birth/Sex: [birth year] (52 y.o. Female) Treating RN: Roger Shelter Primary Care Skeeter Sheard: Delight Stare Other Clinician: Referring Isao Seltzer: Delight Stare Treating Elzie Sheets/Extender: STONE III, HOYT Weeks in Treatment: 6 Wound Status Wound Number: 11 Primary Trauma, Other Etiology: Wound Location: Right Lower Leg - Medial Wound Open Wounding Event: Trauma Status: Date Acquired: 01/12/2018 Comorbid Anemia, Arrhythmia, Coronary Artery Disease, Weeks Of Treatment: 2 History: Hypotension, Peripheral Arterial Disease, Type Clustered Wound: No II Diabetes, Osteoarthritis Photos Photo Uploaded By: Roger Shelter on 01/30/2018 15:18:16 Wound Measurements Length: (cm) 1.2 Width: (cm) 0.8 Depth: (cm) 0.1 Area: (cm) 0.754 Volume: (cm) 0.075 % Reduction in Area: 46.7% % Reduction in Volume: 46.8% Epithelialization: Small (1-33%) Tunneling: No Undermining: No Wound Description Full Thickness Without Exposed Support Classification: Structures Wound Margin: Flat and Intact Exudate None Present Amount: Foul Odor After Cleansing: No Slough/Fibrino Yes Wound Bed Granulation Amount: Large (67-100%) Exposed Structure Granulation Quality: Red Fascia Exposed: No Necrotic Amount: Small (1-33%) Fat Layer (Subcutaneous Tissue) Exposed: No Necrotic Quality: Eschar Tendon Exposed: No Muscle Exposed: No Joint Exposed: No Bone Exposed: No Periwound Skin Texture Dabbs, Leimomi S. ([id number]) Texture Color No Abnormalities Noted: No No Abnormalities Noted: No Callus: No Atrophie Blanche: No Crepitus: No Cyanosis: No Excoriation: No Ecchymosis: No Induration:  No Erythema: No Rash: No Hemosiderin Staining: No Scarring: No  Mottled: No Pallor: No Moisture Rubor: No No Abnormalities Noted: No Dry / Scaly: No Temperature / Pain Maceration: No Temperature: No Abnormality Tenderness on Palpation: Yes Wound Preparation Ulcer Cleansing: Rinsed/Irrigated with Saline Topical Anesthetic Applied: None Treatment Notes Wound #11 (Right, Medial Lower Leg) 1. Cleansed with: Clean wound with Normal Saline 2. Anesthetic Topical Lidocaine 4% cream to wound bed prior to debridement 4. Dressing Applied: Other dressing (specify in notes) 5. Secondary Dressing Applied ABD Pad Kerlix/Conform 7. Secured with Tape Notes betadine Electronic Signature(s) Signed: 01/30/2018 3:21:12 PM By: Roger Shelter Entered By: Roger Shelter on 01/30/2018 13:44:25 Hegg, [name] ([id number]) -------------------------------------------------------------------------------- Wound Assessment Details Patient Name: [name]. Date of Service: 01/30/2018 1:00 PM Medical Record Number: [id number] Patient Account Number: [id number] Date of Birth/Sex: 1966/10/18 (52 y.o. Female) Treating RN: Roger Shelter Primary Care Hanan Mcwilliams: Delight Stare Other Clinician: Referring Lando Alcalde: Delight Stare Treating Quenisha Lovins/Extender: STONE III, HOYT Weeks in Treatment: 6 Wound Status Wound Number: 3 Primary Diabetic Wound/Ulcer of the Lower Extremity Etiology: Wound Location: Right Malleolus - Lateral Wound Open Wounding Event: Pressure Injury Status: Date Acquired: 09/23/2016 Comorbid Anemia, Arrhythmia, Coronary Artery Disease, Weeks Of Treatment: 6 History: Hypotension, Peripheral Arterial Disease, Type Clustered Wound: No II Diabetes, Osteoarthritis Photos Photo Uploaded By: Roger Shelter on 01/30/2018 15:19:21 Wound Measurements Length: (cm) 0.6 Width: (cm) 0.6 Depth: (cm) 0.2 Area: (cm) 0.283 Volume: (cm) 0.057 % Reduction in Area: 66.6% % Reduction  in Volume: 66.5% Epithelialization: None Tunneling: No Undermining: No Wound Description Classification: Grade 2 Wound Margin: Distinct, outline attached Exudate Amount: Large Exudate Type: Serous Exudate Color: amber Foul Odor After Cleansing: No Slough/Fibrino Yes Wound Bed Granulation Amount: Medium (34-66%) Exposed Structure Granulation Quality: Pink Fascia Exposed: No Necrotic Amount: Medium (34-66%) Fat Layer (Subcutaneous Tissue) Exposed: Yes Necrotic Quality: Eschar, Adherent Slough Tendon Exposed: No Muscle Exposed: No Joint Exposed: No Bone Exposed: No Periwound Skin Texture Marohl, Lakara S. ([id number]) Texture Color No Abnormalities Noted: No No Abnormalities Noted: No Callus: No Atrophie Blanche: No Crepitus: No Cyanosis: No Excoriation: No Ecchymosis: No Induration: No Erythema: No Rash: No Hemosiderin Staining: No Scarring: No Mottled: No Pallor: No Moisture Rubor: No No Abnormalities Noted: No Dry / Scaly: No Temperature / Pain Maceration: No Temperature: No Abnormality Wound Preparation Ulcer Cleansing: Rinsed/Irrigated with Saline Topical Anesthetic Applied: None Treatment Notes Wound #3 (Right, Lateral Malleolus) 1. Cleansed with: Clean wound with Normal Saline 5. Secondary Dressing Applied Bordered Foam Dressing Non-Adherent pad Electronic Signature(s) Signed: 01/30/2018 3:21:12 PM By: Roger Shelter Entered By: Roger Shelter on 01/30/2018 13:44:59 Surles, Autumm S. ([id number]) -------------------------------------------------------------------------------- Wound Assessment Details Patient Name: [name]. Date of Service: 01/30/2018 1:00 PM Medical Record Number: [id number] Patient Account Number: [id number] Date of Birth/Sex: 25-Feb-1966 (52 y.o. Female) Treating RN: Roger Shelter Primary Care Henryk Ursin: Delight Stare Other Clinician: Referring Johnanthony Wilden: Delight Stare Treating Khori Rosevear/Extender: STONE III, HOYT Weeks in  Treatment: 6 Wound Status Wound Number: 4 Primary Diabetic Wound/Ulcer of the Lower Extremity Etiology: Wound Location: Left Calcaneus - Lateral Wound Open Wounding Event: Blister Status: Date Acquired: 11/27/2017 Comorbid Anemia, Arrhythmia, Coronary Artery Disease, Weeks Of Treatment: 6 History: Hypotension, Peripheral Arterial Disease, Type Clustered Wound: No II Diabetes, Osteoarthritis Pending Amputation On Presentation Photos Photo Uploaded By: Roger Shelter on 01/30/2018 15:19:22 Wound Measurements Length: (cm) 1.1 Width: (cm) 1.5 Depth: (cm) 0.1 Area: (cm) 1.296 Volume: (cm) 0.13 % Reduction in Area: 92% % Reduction in Volume: 96% Tunneling: No Undermining: No Wound Description Classification: Grade 2 Wound Margin: Distinct, outline attached  Exudate Amount: Medium Exudate Type: Serosanguineous Exudate Color: red, brown Foul Odor After Cleansing: No Slough/Fibrino Yes Wound Bed Granulation Amount: Small (1-33%) Exposed Structure Granulation Quality: Pink Fascia Exposed: No Necrotic Amount: Large (67-100%) Fat Layer (Subcutaneous Tissue) Exposed: Yes Necrotic Quality: Eschar Tendon Exposed: No Muscle Exposed: No Joint Exposed: No Bone Exposed: No Periwound Skin Texture Derick, Ryland S. ([id number]) Texture Color No Abnormalities Noted: No No Abnormalities Noted: No Callus: Yes Atrophie Blanche: No Crepitus: No Cyanosis: No Excoriation: No Ecchymosis: No Induration: No Erythema: No Rash: No Hemosiderin Staining: No Scarring: No Mottled: No Pallor: No Moisture Rubor: No No Abnormalities Noted: No Dry / Scaly: Yes Temperature / Pain Maceration: No Temperature: No Abnormality Wound Preparation Ulcer Cleansing: Rinsed/Irrigated with Saline Topical Anesthetic Applied: None Treatment Notes Wound #4 (Left, Lateral Calcaneus) 1. Cleansed with: Clean wound with Normal Saline 2. Anesthetic Topical Lidocaine 4% cream to wound bed prior to  debridement 4. Dressing Applied: Other dressing (specify in notes) 5. Secondary Dressing Applied ABD Pad Kerlix/Conform 7. Secured with Tape Notes betadine Electronic Signature(s) Signed: 01/30/2018 3:21:12 PM By: Roger Shelter Entered By: Roger Shelter on 01/30/2018 13:46:04 Ephraim, [name] ([id number]) -------------------------------------------------------------------------------- Wound Assessment Details Patient Name: [name]. Date of Service: 01/30/2018 1:00 PM Medical Record Number: [id number] Patient Account Number: [id number] Date of Birth/Sex: Jun 17, 1966 (52 y.o. Female) Treating RN: Roger Shelter Primary Care Jacque Byron: Delight Stare Other Clinician: Referring Caldwell Kronenberger: Delight Stare Treating Arietta Eisenstein/Extender: STONE III, HOYT Weeks in Treatment: 6 Wound Status Wound Number: 5 Primary Diabetic Wound/Ulcer of the Lower Extremity Etiology: Wound Location: Left Toe Great - Circumfernential Wound Open Wounding Event: Gradually Appeared Status: Date Acquired: 11/27/2017 Comorbid Anemia, Arrhythmia, Coronary Artery Disease, Weeks Of Treatment: 6 History: Hypotension, Peripheral Arterial Disease, Type Clustered Wound: No II Diabetes, Osteoarthritis Pending Amputation On Presentation Photos Photo Uploaded By: Roger Shelter on 01/30/2018 15:20:07 Wound Measurements Length: (cm) 2.6 Width: (cm) 2 Depth: (cm) 0.1 Area: (cm) 4.084 Volume: (cm) 0.408 % Reduction in Area: 86.4% % Reduction in Volume: 86.4% Epithelialization: None Tunneling: No Undermining: No Wound Description Classification: Grade 2 Wound Margin: Distinct, outline attached Exudate Amount: None Present Foul Odor After Cleansing: No Slough/Fibrino Yes Wound Bed Granulation Amount: None Present (0%) Necrotic Amount: Large (67-100%) Necrotic Quality: Eschar Periwound Skin Texture Texture Color No Abnormalities Noted: No No Abnormalities Noted: No Callus: No Atrophie Blanche:  No Crepitus: No Cyanosis: No Excoriation: No Ecchymosis: No Jorgensen, Denys S. ([id number]) Induration: No Erythema: No Rash: No Hemosiderin Staining: No Scarring: No Mottled: No Pallor: No Moisture Rubor: No No Abnormalities Noted: No Dry / Scaly: No Temperature / Pain Maceration: No Temperature: No Abnormality Wound Preparation Ulcer Cleansing: Rinsed/Irrigated with Saline Topical Anesthetic Applied: None Treatment Notes Wound #5 (Left, Circumferential Toe Great) 1. Cleansed with: Clean wound with Normal Saline 2. Anesthetic Topical Lidocaine 4% cream to wound bed prior to debridement 4. Dressing Applied: Other dressing (specify in notes) 5. Secondary Dressing Applied ABD Pad Kerlix/Conform 7. Secured with Tape Notes betadine Electronic Signature(s) Signed: 01/30/2018 3:21:12 PM By: Roger Shelter Entered By: Roger Shelter on 01/30/2018 13:46:47 Bottoms, [name] ([id number]) -------------------------------------------------------------------------------- Wound Assessment Details Patient Name: [name]. Date of Service: 01/30/2018 1:00 PM Medical Record Number: [id number] Patient Account Number: [id number] Date of Birth/Sex: 09/13/1966 (52 y.o. Female) Treating RN: Roger Shelter Primary Care Brick Ketcher: Delight Stare Other Clinician: Referring Roniqua Kintz: Delight Stare Treating Dalary Hollar/Extender: STONE III, HOYT Weeks in Treatment: 6 Wound Status Wound Number: 6 Primary Diabetic Wound/Ulcer of the Lower Extremity Etiology: Wound  Location: Left Toe Fourth - Circumfernential Wound Open Wounding Event: Gradually Appeared Status: Date Acquired: 11/27/2017 Comorbid Anemia, Arrhythmia, Coronary Artery Disease, Weeks Of Treatment: 6 History: Hypotension, Peripheral Arterial Disease, Type Clustered Wound: No II Diabetes, Osteoarthritis Pending Amputation On Presentation Photos Photo Uploaded By: Roger Shelter on 01/30/2018 15:20:08 Wound  Measurements Length: (cm) 2 Width: (cm) 2.2 Depth: (cm) 0.1 Area: (cm) 3.456 Volume: (cm) 0.346 % Reduction in Area: 26.7% % Reduction in Volume: 26.5% Epithelialization: None Tunneling: No Undermining: No Wound Description Classification: Grade 2 Wound Margin: Distinct, outline attached Exudate Amount: Large Exudate Type: Serosanguineous Exudate Color: red, brown Foul Odor After Cleansing: No Slough/Fibrino Yes Wound Bed Granulation Amount: None Present (0%) Exposed Structure Necrotic Amount: Large (67-100%) Fascia Exposed: No Necrotic Quality: Eschar, Adherent Slough Fat Layer (Subcutaneous Tissue) Exposed: No Tendon Exposed: No Muscle Exposed: No Joint Exposed: No Bone Exposed: No Periwound Skin Texture Damewood, Lazette S. ([id number]) Texture Color No Abnormalities Noted: No No Abnormalities Noted: No Callus: No Atrophie Blanche: No Crepitus: No Cyanosis: No Excoriation: No Ecchymosis: No Induration: No Erythema: No Rash: No Hemosiderin Staining: No Scarring: No Mottled: No Pallor: No Moisture Rubor: No No Abnormalities Noted: No Dry / Scaly: Yes Temperature / Pain Maceration: No Temperature: No Abnormality Wound Preparation Ulcer Cleansing: Rinsed/Irrigated with Saline Topical Anesthetic Applied: None Treatment Notes Wound #6 (Left, Circumferential Toe Fourth) 1. Cleansed with: Clean wound with Normal Saline 2. Anesthetic Topical Lidocaine 4% cream to wound bed prior to debridement 4. Dressing Applied: Other dressing (specify in notes) 5. Secondary Dressing Applied ABD Pad Kerlix/Conform 7. Secured with Tape Notes betadine, silvercel Electronic Signature(s) Signed: 01/30/2018 3:21:12 PM By: Roger Shelter Entered By: Roger Shelter on 01/30/2018 13:47:14 Mcglade, [name] ([id number]) -------------------------------------------------------------------------------- Wound Assessment Details Patient Name: ABBRIELLE, BATTS S. Date of  Service: 01/30/2018 1:00 PM Medical Record Number: [id number] Patient Account Number: [id number] Date of Birth/Sex: December 12, 1965 (52 y.o. Female) Treating RN: Roger Shelter Primary Care Leila Schuff: Delight Stare Other Clinician: Referring Erice Ahles: Delight Stare Treating Dafney Farler/Extender: STONE III, HOYT Weeks in Treatment: 6 Wound Status Wound Number: 7 Primary Pressure Ulcer Etiology: Wound Location: Right Gluteus Wound Open Wounding Event: Gradually Appeared Status: Date Acquired: 09/18/2016 Comorbid Anemia, Arrhythmia, Coronary Artery Disease, Weeks Of Treatment: 6 History: Hypotension, Peripheral Arterial Disease, Type Clustered Wound: No II Diabetes, Osteoarthritis Photos Photo Uploaded By: Roger Shelter on 01/30/2018 15:20:53 Wound Measurements Length: (cm) 1.1 Width: (cm) 0.6 Depth: (cm) 0.5 Area: (cm) 0.518 Volume: (cm) 0.259 % Reduction in Area: 65.3% % Reduction in Volume: 75.2% Epithelialization: None Tunneling: No Undermining: No Wound Description Classification: Category/Stage IV Wound Margin: Distinct, outline attached Exudate Amount: Large Exudate Type: Serosanguineous Exudate Color: red, brown Foul Odor After Cleansing: No Slough/Fibrino Yes Wound Bed Granulation Amount: Large (67-100%) Exposed Structure Granulation Quality: Red Fat Layer (Subcutaneous Tissue) Exposed: Yes Necrotic Amount: Small (1-33%) Necrotic Quality: Adherent Slough Periwound Skin Texture Texture Color No Abnormalities Noted: No No Abnormalities Noted: No Callus: No Atrophie Blanche: No Waddell, Chiquetta S. ([id number]) Crepitus: No Cyanosis: No Excoriation: Yes Ecchymosis: No Induration: No Erythema: No Rash: No Hemosiderin Staining: No Scarring: No Mottled: No Pallor: No Moisture Rubor: No No Abnormalities Noted: No Dry / Scaly: No Temperature / Pain Maceration: No Temperature: No Abnormality Tenderness on Palpation: Yes Wound Preparation Ulcer Cleansing:  Rinsed/Irrigated with Saline Topical Anesthetic Applied: Other: lidocaine, Treatment Notes Wound #7 (Right Gluteus) 1. Cleansed with: Clean wound with Normal Saline 2. Anesthetic Topical Lidocaine 4% cream to wound bed prior to debridement 4. Dressing Applied:  Prisma Ag 5. Secondary Dressing Applied Bordered Foam Dressing Notes drawtex Electronic Signature(s) Signed: 01/30/2018 3:21:12 PM By: Roger Shelter Entered By: Roger Shelter on 01/30/2018 13:47:28 Plouffe, [name] ([id number]) -------------------------------------------------------------------------------- Wound Assessment Details Patient Name: [name]. Date of Service: 01/30/2018 1:00 PM Medical Record Number: [id number] Patient Account Number: [id number] Date of Birth/Sex: 02-27-1966 (52 y.o. Female) Treating RN: Roger Shelter Primary Care Tonga Prout: Delight Stare Other Clinician: Referring Danyelle Brookover: Delight Stare Treating Kelsey Edman/Extender: STONE III, HOYT Weeks in Treatment: 6 Wound Status Wound Number: 8 Primary Diabetic Wound/Ulcer of the Lower Extremity Etiology: Wound Location: Left Foot - Lateral Wound Open Wounding Event: Gradually Appeared Status: Date Acquired: 11/27/2017 Comorbid Anemia, Arrhythmia, Coronary Artery Disease, Weeks Of Treatment: 6 History: Hypotension, Peripheral Arterial Disease, Type Clustered Wound: No II Diabetes, Osteoarthritis Pending Amputation On Presentation Photos Photo Uploaded By: Roger Shelter on 01/30/2018 15:20:53 Wound Measurements Length: (cm) 1.8 Width: (cm) 2.1 Depth: (cm) 0.1 Area: (cm) 2.969 Volume: (cm) 0.297 % Reduction in Area: 97% % Reduction in Volume: 97% Epithelialization: None Tunneling: No Undermining: No Wound Description Classification: Grade 2 Wound Margin: Distinct, outline attached Exudate Amount: Medium Exudate Type: Serosanguineous Exudate Color: red, brown Foul Odor After Cleansing: No Slough/Fibrino Yes Wound  Bed Granulation Amount: None Present (0%) Exposed Structure Necrotic Amount: Large (67-100%) Fat Layer (Subcutaneous Tissue) Exposed: Yes Necrotic Quality: Eschar Periwound Skin Texture Texture Color No Abnormalities Noted: No No Abnormalities Noted: No Callus: No Atrophie Blanche: No Winchel, Loida S. ([id number]) Crepitus: No Cyanosis: No Excoriation: No Ecchymosis: No Induration: No Erythema: No Rash: No Hemosiderin Staining: No Scarring: No Mottled: No Pallor: No Moisture Rubor: No No Abnormalities Noted: No Dry / Scaly: Yes Temperature / Pain Maceration: No Temperature: No Abnormality Wound Preparation Ulcer Cleansing: Rinsed/Irrigated with Saline Topical Anesthetic Applied: None Treatment Notes Wound #8 (Left, Lateral Foot) 1. Cleansed with: Clean wound with Normal Saline 2. Anesthetic Topical Lidocaine 4% cream to wound bed prior to debridement 4. Dressing Applied: Other dressing (specify in notes) 5. Secondary Dressing Applied ABD Pad Kerlix/Conform 7. Secured with Tape Notes betadine Electronic Signature(s) Signed: 01/30/2018 3:21:12 PM By: Roger Shelter Entered By: Roger Shelter on 01/30/2018 13:47:53 Reedy, [name] ([id number]) -------------------------------------------------------------------------------- Vitals Details Patient Name: [name]. Date of Service: 01/30/2018 1:00 PM Medical Record Number: [id number] Patient Account Number: [id number] Date of Birth/Sex: 16-Jun-1966 (52 y.o. Female) Treating RN: Roger Shelter Primary Care Bernarda Erck: Delight Stare Other Clinician: Referring Clarene Curran: Delight Stare Treating Belisa Eichholz/Extender: STONE III, HOYT Weeks in Treatment: 6 Vital Signs Time Taken: 13:20 Temperature (F): 98.2 Height (in): 67 Pulse (bpm): 79 Respiratory Rate (breaths/min): 18 Blood Pressure (mmHg): 104/63 Reference Range: 80 - 120 mg / dl Electronic Signature(s) Signed: 01/30/2018 3:21:12 PM By: Roger Shelter Entered By: Roger Shelter on 01/30/2018 13:20:20

## 2018-02-03 NOTE — Progress Notes (Signed)
Jamie, [name] ([id number]) Visit Report for 01/30/2018 Chief Complaint Document Details Patient Name: [name], [name]. Date of Service: 01/30/2018 1:00 PM Medical Record Number: [id number] Patient Account Number: [id number] Date of Birth/Sex: 1966/11/14 (52 y.o. Female) Treating RN: Montey Hora Primary Care Provider: Delight Stare Other Clinician: Referring Provider: Delight Stare Treating Provider/Extender: Melburn Hake, Annaliah Rivenbark Weeks in Treatment: 6 Information Obtained from: Patient Chief Complaint She is here in follow up evaluation for multiple wounds Electronic Signature(s) Signed: 01/30/2018 6:13:52 PM By: Worthy Keeler PA-C Entered By: Worthy Keeler on 01/30/2018 13:22:16 Youkhana, [name] ([id number]) -------------------------------------------------------------------------------- Debridement Details Patient Name: [name]. Date of Service: 01/30/2018 1:00 PM Medical Record Number: [id number] Patient Account Number: [id number] Date of Birth/Sex: 11/06/1966 (52 y.o. Female) Treating RN: Montey Hora Primary Care Provider: Delight Stare Other Clinician: Referring Provider: Delight Stare Treating Provider/Extender: Melburn Hake, Jakell Trusty Weeks in Treatment: 6 Debridement Performed for Wound #10 Left,Circumferential Toe Fifth Assessment: Performed By: Physician STONE III, Louanna Vanliew E., PA-C Debridement: Open Wound/Selective Severity of Tissue Pre Limited to breakdown of skin Debridement: Debridement Description: Selective Pre-procedure Verification/Time Yes - 14:03 Out Taken: Start Time: 14:03 Pain Control: Lidocaine 4% Topical Solution Level: Non-Viable Tissue Total Area Debrided (L x W): 2 (cm) x 1.5 (cm) = 3 (cm) Tissue and other material Non-Viable, Eschar debrided: Instrument: Forceps, Scissors Bleeding: None End Time: 14:04 Procedural Pain: 0 Post Procedural Pain: 0 Response to Treatment: Procedure was tolerated well Post Debridement Measurements of Total  Wound Length: (cm) 2 Width: (cm) 1.5 Depth: (cm) 0.1 Volume: (cm) 0.236 Character of Wound/Ulcer Post Debridement: Improved Severity of Tissue Post Debridement: Limited to breakdown of skin Post Procedure Diagnosis Same as Pre-procedure Electronic Signature(s) Signed: 01/30/2018 3:20:05 PM By: Montey Hora Signed: 01/30/2018 6:13:52 PM By: Worthy Keeler PA-C Entered By: Montey Hora on 01/30/2018 14:04:53 Taitano, Katlyne S. ([id number]) -------------------------------------------------------------------------------- Debridement Details Patient Name: [name]. Date of Service: 01/30/2018 1:00 PM Medical Record Number: [id number] Patient Account Number: [id number] Date of Birth/Sex: 05/06/1966 (52 y.o. Female) Treating RN: Montey Hora Primary Care Provider: Delight Stare Other Clinician: Referring Provider: Delight Stare Treating Provider/Extender: Melburn Hake, Erian Lariviere Weeks in Treatment: 6 Debridement Performed for Wound #4 Left,Lateral Calcaneus Assessment: Performed By: Physician STONE III, Tommie Bohlken E., PA-C Debridement: Open Wound/Selective Severity of Tissue Pre Limited to breakdown of skin Debridement: Debridement Description: Selective Pre-procedure Verification/Time Yes - 14:04 Out Taken: Start Time: 14:03 Pain Control: Lidocaine 4% Topical Solution Level: Non-Viable Tissue Total Area Debrided (L x W): 1.1 (cm) x 1.5 (cm) = 1.65 (cm) Tissue and other material Non-Viable, Eschar debrided: Instrument: Forceps, Scissors Bleeding: None End Time: 14:06 Procedural Pain: 0 Post Procedural Pain: 0 Response to Treatment: Procedure was tolerated well Post Debridement Measurements of Total Wound Length: (cm) 1.1 Width: (cm) 1.5 Depth: (cm) 0.1 Volume: (cm) 0.13 Character of Wound/Ulcer Post Debridement: Improved Severity of Tissue Post Debridement: Limited to breakdown of skin Post Procedure Diagnosis Same as Pre-procedure Electronic Signature(s) Signed:  01/30/2018 3:20:05 PM By: Montey Hora Signed: 01/30/2018 6:13:52 PM By: Worthy Keeler PA-C Entered By: Montey Hora on 01/30/2018 14:05:42 Southern, Tremeka S. ([id number]) -------------------------------------------------------------------------------- Debridement Details Patient Name: [name]. Date of Service: 01/30/2018 1:00 PM Medical Record Number: [id number] Patient Account Number: [id number] Date of Birth/Sex: [birth year] (52 y.o. Female) Treating RN: Montey Hora Primary Care Provider: Delight Stare Other Clinician: Referring Provider: Delight Stare Treating Provider/Extender: STONE III, Deborra Phegley Weeks in Treatment: 6 Debridement Performed for Wound #8 Left,Lateral Foot Assessment: Performed By: Physician  STONE III, Xayla Puzio E., PA-C Debridement: Open Wound/Selective Severity of Tissue Pre Limited to breakdown of skin Debridement: Debridement Description: Selective Pre-procedure Verification/Time Yes - 14:06 Out Taken: Start Time: 14:06 Pain Control: Lidocaine 4% Topical Solution Level: Non-Viable Tissue Total Area Debrided (L x W): 1.8 (cm) x 2.1 (cm) = 3.78 (cm) Tissue and other material Non-Viable, Eschar debrided: Instrument: Forceps, Scissors Bleeding: None End Time: 14:08 Procedural Pain: 0 Post Procedural Pain: 0 Response to Treatment: Procedure was tolerated well Post Debridement Measurements of Total Wound Length: (cm) 1.8 Width: (cm) 2.1 Depth: (cm) 0.1 Volume: (cm) 0.297 Character of Wound/Ulcer Post Debridement: Improved Severity of Tissue Post Debridement: Fat layer exposed Post Procedure Diagnosis Same as Pre-procedure Electronic Signature(s) Signed: 01/30/2018 3:20:05 PM By: Montey Hora Signed: 01/30/2018 6:13:52 PM By: Worthy Keeler PA-C Entered By: Montey Hora on 01/30/2018 14:10:10 Windholz, Marlin S. ([id number]) -------------------------------------------------------------------------------- HPI Details Patient Name: [name]. Date of Service: 01/30/2018 1:00 PM Medical Record Number: [id number] Patient Account Number: [id number] Date of Birth/Sex: [birth year] (52 y.o. Female) Treating RN: Montey Hora Primary Care Provider: Delight Stare Other Clinician: Referring Provider: Delight Stare Treating Provider/Extender: Melburn Hake, Caspar Favila Weeks in Treatment: 6 History of Present Illness HPI Description: 52 year old patient was sent to [place] from [facility] where she was seen by Dr. Sherril Cong for a left ankle ulceration and was recently hospitalized with hypotension and sepsis. She was treated with IV antibiotics and has been scheduled to see Dr. Sharol Given. She was seen by vascular surgery who recommended a femoral bypass but surgery has been delayed until her sacral wound from last year's necrotizing fasciitis has healed. Was seeing the wound care team at [facility] but wanted to change over. She is a smoker and smokes a pack of cigarettes a day The patient was recently admitted in [place] between February 2 and February 14. She had a follow-up to see vascular surgery, orthopedic surgery and infectious disease. 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 Review of the electronic medical records reveals that Dr. 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 An MRI showed edema in the bone which was consistent with osteomyelitis and the chronic ulcer and may need surgical intervention. She also had a sacral decubitus ulcer which was treated with the wound VAC. On 01/07/2017 she was taken up by  the vascular surgeon Dr. Trula Slade for an abdominal aortogram and bilateral lower extremity runoff, for a history of having bilateral femoropopliteal bypass graft as well as external iliac stenting on the right and stenting of her bypass graft. She had developed a nonhealing wound on her right ankle and there was a possibility of a femoral occlusion. the findings were noted and the impression was a surgical revascularization with a aorto bifemoral bypass graft. Both the femoropopliteal bypass grafts were patent. 01/31/2017 -- x-ray of the right hip and pelvis -- IMPRESSION:No radiographic evidence of acute osteomyelitis. Normal-appearing right hip joint space for age. No acute bony abnormality of the hip. 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 Since seeing her last week she  has not had an appointment with infectious disease or the orthopedic specialist yet. 02/06/2017 -- the patient missed a couple of appointments yesterday due to the weather but other than that has apparently given up smoking for the last 5 days. 02/13/2017 -- she has rescheduled her infectious disease appointment and also the appointment with the orthopedic surgeon Dr. Sharol Given 03/06/2017 -- she was seen by Dr. Lucianne Lei dam of infectious disease on 03/05/2017 and he recommendedIV antibiotics but the patient did not want to have that and he has given her Augmentin and doxycycline and will reevaluate her in 2 months time. 03/13/2017 -- he saw Dr. Trula Slade regarding her vascular issues and he would like to wait to the sacral wound is completely closed before considering aortobifemoral bypass graft. He will see her back in 2 months time. She was also seen by Dr. Sharol Given of orthopedics who recommended continue wound care dressings and follow in the office in about 2 months time. he also recommended 3 view radiographs of the right ankle at follow-up. 03/28/2017 -- she did have a PICC line  placed and Dr. Lucianne Lei dam has begun IV vancomycin and ceftriaxone. she is going to continue this until she sees him in approximately a month's time 04/18/2017 -- we had applied for a skin substitute for the patient's care but her copayment is going to be about $300 a piece and the patient will not be able to afford this. 05/08/17 on evaluation today patient appears to potentially have a fungal rash in the periwound region of the sacral area. This also seems to extend into the inguinal creases and factional region as well. She is tender to palpation with light rubbing over the region of the periwound and the skin in this region does have a beefy red appearance. Everything seems to point to a fungal infection at this time. She has been tolerating the dressing changes up to this point. There is no evidence of infection otherwise. 05/16/2017 -- was seen by Dr. Rhina Brackett dam of infectious disease -- he saw for the chronic wound over her right ankle [place]. ([id number]) with osteomyelitis of the fibula. He did not think that she is making progress and was not able to examine her sacral decubitus ulcer as the patient would not allow it. She was switched to Bactrim DS 2 tablets twice a day since finishing her antibiotics. Her ESR was 76 and a CRP was 2.8. 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 05/23/17 on evaluation today patient's wound appears to be doing about the same in regard to the ankle as well as the gluteal area. She apparently has issues with the one back staying in place and she states the sill seems to break up the inferior aspect. She does have home help coming out to apply the wound VAC. She has been tolerating the dressing changes without any problem although she has had some increased pain in the gluteal region due to a fall she sustained and she feels like she brews that  area and she had pain actually radiating down her leg which is slowly beginning to improve. There's no evidence of infection. 05/29/17 Unfortunately on evaluation today patient's wounds both in regard to the ankle as well as in regard to her gluteal region on the right appear to be measuring the same as last week's evaluation. She also tells me that she is having soreness  today in regard to the right gluteal area because she had a visitor who came to see her a couple days ago and stayed for six hours and she had to set up for that entire length of time visiting. This has called some increased discomfort. Normally she does not set up for those long periods of time. Fortunately there's no evidence of infection and no worsening of the wounds. 06/13/2017 -- the patient has had a lot of health issues including perfuse diarrhea and generalized weakness and I have instructed her to see her PCP as soon as possible and if things get worse to go to the ER. She has not brought her wound VAC today. She continues to be on oral antibiotics 06/30/17- she is here in follow up evaluation for right ischial and right lateral malleolus ulcer. She continues with negative pressure wound therapy to the ischial wound. She is currently on a diabetic therapy per infectious disease, although she does state that she inconsistently takes it. She was advised to take her antibiotic as prescribed 07/11/2017 -- she has been unable to use a wound VAC regularly because she loses this evening and it has not been possible for her to use it. 07/18/2017 -- she is much more comfortable and looks better without the wound VAC and at this stage I believe it will be better for her to return the machine and continue with local care. 08/07/2017 -- she is back up to 3 weeks due to a vacation and during this time her wound VAC was returned. She has been doing local dressings 08/15/17 on evaluation today patient appears to be doing about the same  in regard to her ankle as well as right gluteal wound. She has been tolerating the dressing changes and tells me that it's mainly her gluteal wound that hurts the ankle is nontender to palpation. She rates this pain to be a one out of 10 at rest and with cleansing of the wound five out of 10 at least. No fevers, chills, nausea, or vomiting noted at this time. She does note that home health has told her to inquire about possibly using silver nitrate on the wound edges due to the road nature of the edges. 08/29/2017 -- -- I got a note which Dr. Lucianne Lei dam sent to Dr. 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 He had recommended definitive surgery to fix this. He recommended to continue with Bactrim Right ankle x-ray -- IMPRESSION: Findings compatible with cellulitis of the lateral malleolar region. A small focus of bony destruction involving the tip of the lateral malleolus is worrisome for osteomyelitis. 09/05/2017 -- the patient was seen by Dr. Meridee Score yesterday who thought her wound looked good and he would continue local care with silver alginate dressing and follow-up in 4 weeks. He did not recommend any surgical intervention of the ankle until there is good arterial flow and he would await Dr. Stephens Shire opinion regarding this. She was also seen by infectious disease Dr. Lucianne Lei dam, who was concerned about her elevated potassium and took her off Bactrim. he has recommended doxycycline. 09/26/2017 -- she is back after 3 weeks as she was out of town on vacation. She has put on some weight but other than that no change in her health 10/24/2017 -- she was seen by Dr. [name] -- after a thorough review he plans a angiographically via the left femoral approach to evaluate her left subclavian artery  and address any stenosis at that time. He will also perform bilateral runoff to confirm that a right femoropopliteal as  well as a left femoropopliteal bypass graft remained patent. He is then considering a right axillary to femoral bypass graft for limb salvage. ASHLYNN, GUNNELS ([id number]) ==== 12/18/17-she is here in follow-up evaluation for multiple wounds. She is s/p aortobifemoral and left fem-pop revision. She presents with multiple necrotic areas to the left toes and lateral foot. The wounds we were originally seen her for (right heel and left buttock) are improved/stable. According to her husband the wounds to the left foot are "better" than they were upon discharge from the hospital last Tuesday. He states that Dr. Trula Slade saw her on same day of discharge. She has a follow-up with Dr. Trula Slade on Monday. There are dopplerable pulses to the left dorsalis pedis, posterior tibial, peroneal. We communicated with Dr. Stephens Shire nurse regarding today's findings, photos were sent to her email and the patient will maintain her appointment on Monday. We discussed with the patient and her husband to report to [facility] in [place] for any concerns, changes, new areas of necrosis, new areas or progressive ischemia/purple discoloration, pallor, new/increased pain. We will anticipate follow up next week 12/30/17 patient unfortunately continues to have multiple wound of her lower extremities and specifically her left toes and lateral foot. She does have regular follow-up visits with Dr. Trula Slade who performed her surgery and at this point they are just holding to wait and see what needs to be done in regard to her foot such as what's going to heal and what may need to have potential amputation of further surgery. For this reason we are just trying to maintain and see were things end up at this point. Fortunately patient is not having any significant discomfort. The right lateral malleolus appears to be doing about the same there is some granulation tissue she does have osteomyelitis and not ulcer. 01/06/18 patient  presents today for fault evaluation concerning her ongoing issues with her right lateral ankle, right gluteal region, and left lower extremity. Currently regard to left lower extremity it's possible that she may require some amputation although patient states she has the appointment follow-up with Dr. Trula Slade next week. That's when she will find out what he thinks needs to be done at that point. Nonetheless in regard to the right lateral ankle this appears to be doing fairly well today. Her right gluteal region actually appears to be doing fairly well with a good wound surface any slough that was present easily cleaned off with saline and gauze. 01/16/18 on evaluation today patient appears to be doing much better actually in regard to her left foot necrotic areas. A lot of this is starting to liftoff as far as the eschar is concerned and there is a lot of healing underneath which is excellent news. I'm actually pleasantly pleased with the results that she's getting in this regard. Fortunately there does not appear to be any evidence of infection which is great news. Overall she has been tolerating the dressing changes without complication she does have one new ulcer on the right anterior shin due to a small skin tear which is actually healed but then the adhesive from a Band-Aid that was put on it at a doctor's office actually has 20 skin and she says while he has a small ulcer due to that. Fortunately this is superficial. 01/30/18 on evaluation today patient appears to be doing rather well in regard to  her left lower extremity ulcerations as well as her right lateral malleolus and gluteal region. She has been tolerating the dressing changes in general without complication. She did see Dr. Trula Slade her vascular surgeon since I last saw her in the good news is he does not really feel like he's gonna have to perform anything surgical in regard to deputation. With that being said I am still somewhat  concerned about her left fifth toe we will have to see. Nonetheless overall I do believe she's making progress and her right lateral malleolus appears to have at the allies to over at this point the question will be if this stays covered over or not. Electronic Signature(s) Signed: 01/30/2018 6:13:52 PM By: Worthy Keeler PA-C Entered By: Worthy Keeler on 01/30/2018 16:22:43 Budzik, [name] ([id number]) -------------------------------------------------------------------------------- Physical Exam Details Patient Name: CREASIE, LACOSSE S. Date of Service: 01/30/2018 1:00 PM Medical Record Number: [id number] Patient Account Number: [id number] Date of Birth/Sex: February 20, 1966 (52 y.o. Female) Treating RN: Montey Hora Primary Care Provider: Delight Stare Other Clinician: Referring Provider: Delight Stare Treating Provider/Extender: STONE III, Nyrah Demos Weeks in Treatment: 6 Constitutional Well-nourished and well-hydrated in no acute distress. Respiratory normal breathing without difficulty. Psychiatric this patient is able to make decisions and demonstrates good insight into disease process. Alert and Oriented x 3. pleasant and cooperative. Notes Patient's wounds and left lateral foot and plantar surface of the foot as well as her toes all appear to be dry eschar without any evidence of fluctuance or wet gangrene. I did trim today selectively some of the edges of the necrotic eschar that was starting to lift up. Patient had no pain and no bleeding with this procedure. Post procedure everything appeared to be a little bit better. No other debridement was performed at this point. Electronic Signature(s) Signed: 01/30/2018 6:13:52 PM By: Worthy Keeler PA-C Entered By: Worthy Keeler on 01/30/2018 16:23:46 Dabney, [name] ([id number]) -------------------------------------------------------------------------------- Physician Orders Details Patient Name: MIREILLE, LACOMBE. Date of Service: 01/30/2018  1:00 PM Medical Record Number: [id number] Patient Account Number: [id number] Date of Birth/Sex: 04-21-1966 (52 y.o. 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 Wound #11 Right,Medial Lower Leg o Clean wound with Normal Saline. Wound #3 Right,Lateral Malleolus o Clean wound with Normal Saline. Wound #4 Left,Lateral Calcaneus o Clean wound with Normal Saline. Wound #5 Left,Circumferential Toe Great o Clean wound with Normal Saline. Wound #6 Left,Circumferential Toe Fourth o Clean wound with Normal Saline. Wound #7 Right Gluteus o Clean wound with Normal Saline. Wound #8 Left,Lateral Foot o Clean wound with Normal Saline. Anesthetic (add to Medication List) Wound #11 Right,Medial Lower Leg o Topical Lidocaine 4% cream applied to wound bed prior to debridement (In Clinic Only). Wound #4 Left,Lateral Calcaneus o Topical Lidocaine 4% cream applied to wound bed prior to debridement (In Clinic Only). Wound #7 Right Gluteus o Topical Lidocaine 4% cream applied to wound bed prior to debridement (In Clinic Only). Baccam, Anda  S. ([id number]) Skin Barriers/Peri-Wound Care Wound #10 Left,Circumferential Toe Fifth o Skin Prep Wound #11 Right,Medial Lower Leg o Skin Prep Wound #3  Right,Lateral Malleolus o Skin Prep Wound #4 Left,Lateral Calcaneus o Skin Prep Wound #5 Left,Circumferential Toe Great o Skin Prep Wound #6 Left,Circumferential Toe Fourth o Skin Prep Wound #7 Right Gluteus o Skin Prep Wound #8 Left,Lateral Foot o Skin Prep Primary Wound Dressing Wound #10 Left,Circumferential Toe Fifth o Other: - betadine or providone-iodine (paint on wound) - silver alginate between 4th and 5th toes on left foot Wound #11 Right,Medial Lower Leg o Other: - betadine or providone-iodine (paint on wound) - silver alginate between 4th and 5th toes on left foot Wound #4 Left,Lateral Calcaneus o Other: - betadine or providone-iodine (paint on wound) - silver alginate between 4th and 5th toes on left foot Wound #5 Left,Circumferential Toe Great o Other: - betadine or providone-iodine (paint on wound) - silver alginate between 4th and 5th toes on left foot Wound #6 Left,Circumferential Toe Fourth o Other: - betadine or providone-iodine (paint on wound) - silver alginate between 4th and 5th toes on left foot Wound #8 Left,Lateral Foot o Other: - betadine or providone-iodine (paint on wound) - silver alginate between 4th and 5th toes on left foot Wound #7 Right Gluteus o Prisma Ag o Drawtex Wound #3 Right,Lateral Malleolus o Non-adherent pad Secondary Dressing Wound #10 Left,Circumferential Toe Fifth o ABD pad o Conform/Kerlix Wound #11 Right,Medial Lower Leg Mineer, Yamari S. ([id number]) o ABD pad o Conform/Kerlix Wound #3 Right,Lateral Malleolus o Boardered Foam Dressing Wound #4 Left,Lateral Calcaneus o ABD pad o Conform/Kerlix Wound #5 Left,Circumferential Toe Great o ABD pad o Conform/Kerlix Wound #6 Left,Circumferential Toe Fourth o ABD pad o Conform/Kerlix Wound #7 Right Gluteus o Boardered Foam Dressing Wound #8 Left,Lateral Foot o ABD pad o Conform/Kerlix Dressing Change Frequency Wound  #10 Left,Circumferential Toe Fifth o Change dressing every other day. o Other: - PRN Wound #11 Right,Medial Lower Leg o Change dressing every other day. o Other: - PRN Wound #3 Right,Lateral Malleolus o Change dressing every other day. o Other: - PRN Wound #4 Left,Lateral Calcaneus o Change dressing every other day. o Other: - PRN Wound #5 Left,Circumferential Toe Great o Change dressing every other day. o Other: - PRN Wound #6 Left,Circumferential Toe Fourth o Change dressing every other day. o Other: - PRN Wound #7 Right Gluteus o Change dressing every other day. o Other: - PRN Wound #8 Left,Lateral Foot o Change dressing every other day. o Other: - PRN JOCHEBED, BILLS ([id number]) Follow-up Appointments Wound #10 Left,Circumferential Toe Fifth o Return Appointment in 1 week. Wound #11 Right,Medial Lower Leg o Return Appointment in 1 week. Wound #3 Right,Lateral Malleolus o Return Appointment in 1 week. Wound #4 Left,Lateral Calcaneus o Return Appointment in 1 week. Wound #5 Left,Circumferential Toe Great o Return Appointment in 1 week. Wound #6 Left,Circumferential Toe Fourth o Return Appointment in 1 week. Wound #7 Right Gluteus o Return Appointment in 1 week. Wound #8 Left,Lateral Foot o Return Appointment in 1 week. Off-Loading Wound #7 Right Gluteus o Turn and reposition every 2 hours Additional Orders / Instructions Wound #10 Left,Circumferential Toe Fifth o Increase protein intake. Wound #11 Right,Medial Lower Leg o Increase protein intake. Wound #3 Right,Lateral Malleolus o Increase protein intake. Wound #4 Left,Lateral Calcaneus o Increase protein intake. Wound #5 Left,Circumferential Toe Great o Increase protein intake. Wound #6 Left,Circumferential Toe Fourth o Increase protein intake. Wound #7 Right Gluteus o Increase protein intake. Wound #  8 Left,Lateral Foot o Increase  protein intake. [place] ([id number]) Wound #10 Left,Circumferential Toe [place] Nurse may visit PRN to address patientos wound care needs. o FACE TO FACE ENCOUNTER: MEDICARE and MEDICAID PATIENTS: I certify that this patient is under my care and that I had a face-to-face encounter that meets the physician face-to-face encounter requirements with this patient on this date. The encounter with the patient was in whole or in part for the following MEDICAL CONDITION: (primary reason for [place]) MEDICAL NECESSITY: I certify, that based on my findings, NURSING services are a medically necessary home health service. HOME BOUND STATUS: I certify that my clinical findings support that this patient is homebound (i.e., Due to illness or injury, pt requires aid of supportive devices such as crutches, cane, wheelchairs, walkers, the use of special transportation or the assistance of another person to leave their place of residence. There is a normal inability to leave the home and doing so requires considerable and taxing effort. Other absences are for medical reasons / religious services and are infrequent or of short duration when for other reasons). o If current dressing causes regression in wound condition, may D/C ordered dressing product/s and apply Normal Saline Moist Dressing daily until next [place] / Other MD appointment. [place] of regression in wound condition at [phone]. o Please direct any NON-WOUND related issues/requests for orders to patient's Primary Care Physician Wound #11 [place] Visits o Home Health Nurse may visit PRN to address patientos wound care needs. o FACE TO FACE ENCOUNTER: MEDICARE and MEDICAID PATIENTS: I certify that this patient is under my care and that I had a face-to-face encounter that meets the physician  face-to-face encounter requirements with this patient on this date. The encounter with the patient was in whole or in part for the following MEDICAL CONDITION: (primary reason for [place]) MEDICAL NECESSITY: I certify, that based on my findings, NURSING services are a medically necessary home health service. HOME BOUND STATUS: I certify that my clinical findings support that this patient is homebound (i.e., Due to illness or injury, pt requires aid of supportive devices such as crutches, cane, wheelchairs, walkers, the use of special transportation or the assistance of another person to leave their place of residence. There is a normal inability to leave the home and doing so requires considerable and taxing effort. Other absences are for medical reasons / religious services and are infrequent or of short duration when for other reasons). o If current dressing causes regression in wound condition, may D/C ordered dressing product/s and apply Normal Saline Moist Dressing daily until next [place] / Other MD appointment. [place] of regression in wound condition at [phone]. o Please direct any NON-WOUND related issues/requests for orders to patient's Primary Care Physician Wound #3 [place] Nurse may visit PRN to address patientos wound care needs. o FACE TO FACE ENCOUNTER: MEDICARE and MEDICAID PATIENTS: I certify that this patient is under my care and that I had a face-to-face encounter that meets the physician face-to-face encounter requirements with this patient on this date. The encounter with the patient was in whole or in part for the following MEDICAL CONDITION: (primary reason for [place]) MEDICAL NECESSITY: I certify, that based on my findings, NURSING services are a medically necessary home health service. HOME BOUND  STATUS: I certify that my clinical findings support  that this patient is homebound (i.e., Due to illness or injury, pt requires aid of supportive devices such as crutches, cane, wheelchairs, walkers, the use of special transportation or the assistance of another person to leave their place of residence. There is a normal inability to leave the home and doing so requires considerable and taxing effort. Other absences are for medical reasons / religious services and are infrequent or of short duration when for other reasons). o If current dressing causes regression in wound condition, may D/C ordered dressing product/s and apply Normal Saline Moist Dressing daily until next [place] / Other MD appointment. [place] of regression in wound condition at [phone]. o Please direct any NON-WOUND related issues/requests for orders to patient's Primary Care Physician Wound #4 [place] Nurse may visit PRN to address patientos wound care needs. EMILEE, MARKET ([id number]) o FACE TO FACE ENCOUNTER: MEDICARE and MEDICAID PATIENTS: I certify that this patient is under my care and that I had a face-to-face encounter that meets the physician face-to-face encounter requirements with this patient on this date. The encounter with the patient was in whole or in part for the following MEDICAL CONDITION: (primary reason for [place]) MEDICAL NECESSITY: I certify, that based on my findings, NURSING services are a medically necessary home health service. HOME BOUND STATUS: I certify that my clinical findings support that this patient is homebound (i.e., Due to illness or injury, pt requires aid of supportive devices such as crutches, cane, wheelchairs, walkers, the use of special transportation or the assistance of another person to leave their place of residence. There is a normal inability to leave the home and doing so requires considerable and taxing effort.  Other absences are for medical reasons / religious services and are infrequent or of short duration when for other reasons). o If current dressing causes regression in wound condition, may D/C ordered dressing product/s and apply Normal Saline Moist Dressing daily until next [place] / Other MD appointment. [place] of regression in wound condition at [phone]. o Please direct any NON-WOUND related issues/requests for orders to patient's Primary Care Physician Wound #5 Left,Circumferential Toe [place] Nurse may visit PRN to address patientos wound care needs. o FACE TO FACE ENCOUNTER: MEDICARE and MEDICAID PATIENTS: I certify that this patient is under my care and that I had a face-to-face encounter that meets the physician face-to-face encounter requirements with this patient on this date. The encounter with the patient was in whole or in part for the following MEDICAL CONDITION: (primary reason for [place]) MEDICAL NECESSITY: I certify, that based on my findings, NURSING services are a medically necessary home health service. HOME BOUND STATUS: I certify that my clinical findings support that this patient is homebound (i.e., Due to illness or injury, pt requires aid of supportive devices such as crutches, cane, wheelchairs, walkers, the use of special transportation or the assistance of another person to leave their place of residence. There is a normal inability to leave the home and doing so requires considerable and taxing effort. Other absences are for medical reasons / religious services and are infrequent or of short duration when for other reasons). o If current dressing causes regression in wound condition, may D/C ordered dressing product/s and apply Normal Saline Moist Dressing daily until next [place] /  Other MD appointment. [place] of regression in wound  condition at [phone]. o Please direct any NON-WOUND related issues/requests for orders to patient's Primary Care Physician Wound #6 Left,Circumferential Toe [place] Nurse may visit PRN to address patientos wound care needs. o FACE TO FACE ENCOUNTER: MEDICARE and MEDICAID PATIENTS: I certify that this patient is under my care and that I had a face-to-face encounter that meets the physician face-to-face encounter requirements with this patient on this date. The encounter with the patient was in whole or in part for the following MEDICAL CONDITION: (primary reason for [place]) MEDICAL NECESSITY: I certify, that based on my findings, NURSING services are a medically necessary home health service. HOME BOUND STATUS: I certify that my clinical findings support that this patient is homebound (i.e., Due to illness or injury, pt requires aid of supportive devices such as crutches, cane, wheelchairs, walkers, the use of special transportation or the assistance of another person to leave their place of residence. There is a normal inability to leave the home and doing so requires considerable and taxing effort. Other absences are for medical reasons / religious services and are infrequent or of short duration when for other reasons). o If current dressing causes regression in wound condition, may D/C ordered dressing product/s and apply Normal Saline Moist Dressing daily until next [place] / Other MD appointment. [place] of regression in wound condition at [phone]. o Please direct any NON-WOUND related issues/requests for orders to patient's Primary Care Physician Wound #7 Right [place] Nurse may visit PRN to address patientos wound care needs. o FACE TO FACE ENCOUNTER: MEDICARE and MEDICAID PATIENTS: I certify that this patient is under my care and  that I had a face-to-face encounter that meets the physician face-to-face encounter requirements with this patient on this date. The encounter with the patient was in whole or in part for the following MEDICAL CONDITION: (primary reason for [place]) MEDICAL NECESSITY: I certify, that based on my findings, NURSING services are a medically necessary home health service. HOME BOUND STATUS: I certify that my ELBERT, POLYAKOV ([id number]) clinical findings support that this patient is homebound (i.e., Due to illness or injury, pt requires aid of supportive devices such as crutches, cane, wheelchairs, walkers, the use of special transportation or the assistance of another person to leave their place of residence. There is a normal inability to leave the home and doing so requires considerable and taxing effort. Other absences are for medical reasons / religious services and are infrequent or of short duration when for other reasons). o If current dressing causes regression in wound condition, may D/C ordered dressing product/s and apply Normal Saline Moist Dressing daily until next [place] / Other MD appointment. [place] of regression in wound condition at [phone]. o Please direct any NON-WOUND related issues/requests for orders to patient's Primary Care Physician Wound #8 [place] Visits o Home Health Nurse may visit PRN to address patientos wound care needs. o FACE TO FACE ENCOUNTER: MEDICARE and MEDICAID PATIENTS: I certify that this patient is under my care and that I had a face-to-face encounter that meets the physician face-to-face encounter requirements with this patient on this date. The encounter with the patient was in whole or in part for the following MEDICAL CONDITION: (primary reason for [place]) MEDICAL NECESSITY:  I certify, that based on my findings, NURSING services are a medically necessary  home health service. HOME BOUND STATUS: I certify that my clinical findings support that this patient is homebound (i.e., Due to illness or injury, pt requires aid of supportive devices such as crutches, cane, wheelchairs, walkers, the use of special transportation or the assistance of another person to leave their place of residence. There is a normal inability to leave the home and doing so requires considerable and taxing effort. Other absences are for medical reasons / religious services and are infrequent or of short duration when for other reasons). o If current dressing causes regression in wound condition, may D/C ordered dressing product/s and apply Normal Saline Moist Dressing daily until next [place] / Other MD appointment. [place] of regression in wound condition at [phone]. o Please direct any NON-WOUND related issues/requests for orders to patient's Primary Care Physician Electronic Signature(s) Signed: 01/30/2018 3:20:05 PM By: Montey Hora Signed: 01/30/2018 6:13:52 PM By: Worthy Keeler PA-C Entered By: Montey Hora on 01/30/2018 14:19:14 Bischoff, [name] ([id number]) -------------------------------------------------------------------------------- Problem List Details Patient Name: NAELA, NODAL. Date of Service: 01/30/2018 1:00 PM Medical Record Number: [id number] Patient Account Number: [id number] Date of Birth/Sex: Jul 15, 1966 (52 y.o. 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 ([id number]) -------------------------------------------------------------------------------- Progress Note Details Patient Name: [name]. Date of Service: 01/30/2018 1:00 PM Medical Record Number: [id number] Patient Account Number: [id number] Date of Birth/Sex: Nov 16, 1966 (52 y.o. Female) Treating RN: Montey Hora Primary Care Provider: Delight Stare Other Clinician: Referring Provider: Delight Stare Treating Provider/Extender: Melburn Hake, Ubaldo Daywalt Weeks in Treatment: 6 Subjective Chief Complaint Information obtained from Patient She is here in follow up evaluation for multiple wounds History of Present Illness (HPI) 52 year old patient was sent to [place] from [facility] where she was seen by Dr. Sherril Cong for a left ankle ulceration and was recently hospitalized with hypotension and sepsis. She was treated with IV antibiotics and has been scheduled to see Dr. Sharol Given. She was seen by vascular surgery who recommended a femoral bypass but surgery has been delayed until her sacral wound from last year's necrotizing fasciitis has healed. Was seeing the wound care team at [facility] but wanted to change over. She is a smoker and smokes a pack of cigarettes a day The patient was recently admitted in [place] between February 2 and February 14. She had a follow-up to see vascular surgery, orthopedic surgery and infectious disease. During her admission she was known to have peripheral arterial  disease, diabetes mellitus with neuropathy, chronic pain, open wound with necrotizing fasciitis of the sacral area which had been there since November 2017 Past medical history significant for diabetes mellitus, ankle ulcer, sacral  ulcer, necrotizing fasciitis, arterial occlusive disease, tobacco abuse. Review of the electronic medical records reveals that Dr. 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 An MRI showed edema in the bone which was consistent with osteomyelitis and the chronic ulcer and may need surgical intervention. She also had a sacral decubitus ulcer which was treated with the wound VAC. On 01/07/2017 she was taken up by the vascular surgeon Dr. Trula Slade for an abdominal aortogram and bilateral lower extremity runoff, for a history of having bilateral femoropopliteal bypass graft as well as external iliac stenting on the right and stenting of her bypass graft. She had developed a nonhealing wound on her right ankle and there was a possibility of a femoral occlusion. the findings were noted and the impression was a surgical revascularization with a aorto bifemoral bypass graft. Both the femoropopliteal bypass grafts were patent. 01/31/2017 -- x-ray of the right hip and pelvis -- IMPRESSION:No radiographic evidence of acute osteomyelitis. Normal-appearing right hip joint space for age. No acute bony abnormality of the hip. 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 Since seeing her last week she has not had an appointment with infectious disease or the orthopedic specialist yet. 02/06/2017 -- the patient missed a couple of appointments yesterday due to the weather but other than that has apparently given up smoking for the last 5 days. 02/13/2017 -- she has rescheduled her infectious disease appointment and also the  appointment with the orthopedic surgeon Dr. Sharol Given 03/06/2017 -- she was seen by Dr. Lucianne Lei dam of infectious disease on 03/05/2017 and he recommendedIV antibiotics but the patient did not want to have that and he has given her Augmentin and doxycycline and will reevaluate her in 2 months time. 03/13/2017 -- he saw Dr. Trula Slade regarding her vascular issues and he would like to wait to the sacral wound is completely closed before considering aortobifemoral bypass graft. He will see her back in 2 months time. She was also seen by Dr. Sharol Given of orthopedics who recommended continue wound care dressings and follow in the office in about 2 months time. he also recommended 3 view radiographs of the right ankle at follow-up. 03/28/2017 -- she did have a PICC line placed and Dr. Lucianne Lei dam has begun IV vancomycin and ceftriaxone. she is going to continue this until she sees him in approximately a month's time 04/18/2017 -- we had applied for a skin substitute for the patient's care but her copayment is going to be about $300 a piece and the patient will not be able to afford this. 05/08/17 on evaluation today patient appears to potentially have a fungal rash in the periwound region of the sacral area. This Lebow, Rubby S. ([id number]) also seems to extend into the inguinal creases and factional region as well. She is tender to palpation with light rubbing over the region of the periwound and the skin in this region does have a beefy red appearance. Everything seems to point to a fungal infection at this time. She has been tolerating the dressing changes up to this point. There is no evidence of infection otherwise. 05/16/2017 -- was seen by Dr. 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 He did not think that she is making progress and  was not able to examine her sacral decubitus ulcer as the patient would not allow it. She was  switched to Bactrim DS 2 tablets twice a day since finishing her antibiotics. Her ESR was 76 and a CRP was 2.8. 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 05/23/17 on evaluation today patient's wound appears to be doing about the same in regard to the ankle as well as the gluteal area. She apparently has issues with the one back staying in place and she states the sill seems to break up the inferior aspect. She does have home help coming out to apply the wound VAC. She has been tolerating the dressing changes without any problem although she has had some increased pain in the gluteal region due to a fall she sustained and she feels like she brews that area and she had pain actually radiating down her leg which is slowly beginning to improve. There's no evidence of infection. 05/29/17 Unfortunately on evaluation today patient's wounds both in regard to the ankle as well as in regard to her gluteal region on the right appear to be measuring the same as last week's evaluation. She also tells me that she is having soreness today in regard to the right gluteal area because she had a visitor who came to see her a couple days ago and stayed for six hours and she had to set up for that entire length of time visiting. This has called some increased discomfort. Normally she does not set up for those long periods of time. Fortunately there's no evidence of infection and no worsening of the wounds. 06/13/2017 -- the patient has had a lot of health issues including perfuse diarrhea and generalized weakness and I have instructed her to see her PCP as soon as possible and if things get worse to go to the ER. She has not brought her wound VAC today. She continues to be on oral antibiotics 06/30/17- she is here in follow up evaluation for right ischial and right lateral malleolus ulcer. She continues with  negative pressure wound therapy to the ischial wound. She is currently on a diabetic therapy per infectious disease, although she does state that she inconsistently takes it. She was advised to take her antibiotic as prescribed 07/11/2017 -- she has been unable to use a wound VAC regularly because she loses this evening and it has not been possible for her to use it. 07/18/2017 -- she is much more comfortable and looks better without the wound VAC and at this stage I believe it will be better for her to return the machine and continue with local care. 08/07/2017 -- she is back up to 3 weeks due to a vacation and during this time her wound VAC was returned. She has been doing local dressings 08/15/17 on evaluation today patient appears to be doing about the same in regard to her ankle as well as right gluteal wound. She has been tolerating the dressing changes and tells me that it's mainly her gluteal wound that hurts the ankle is nontender to palpation. She rates this pain to be a one out of 10 at rest and with cleansing of the wound five out of 10 at least. No fevers, chills, nausea, or vomiting noted at this time. She does note that home health has told her to inquire about possibly using silver nitrate on the wound edges due to the  road nature of the edges. 08/29/2017 -- -- I got a note which Dr. Lucianne Lei dam sent to Dr. 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 He had recommended definitive surgery to fix this. He recommended to continue with Bactrim Right ankle x-ray -- IMPRESSION: Findings compatible with cellulitis of the lateral malleolar region. A small focus of bony destruction involving the tip of the lateral malleolus is worrisome for osteomyelitis. 09/05/2017 -- the patient was seen by Dr. Meridee Score yesterday who thought her wound looked good and he would continue local care with silver alginate dressing  and follow-up in 4 weeks. He did not recommend any surgical intervention of the ankle until there is good arterial flow and he would await Dr. Stephens Shire opinion regarding this. She was also seen by infectious disease Dr. Lucianne Lei dam, who was concerned about her elevated potassium and took her off Bactrim. he has recommended doxycycline. 09/26/2017 -- she is back after 3 weeks as she was out of town on vacation. She has put on some weight but other than that SHARICKA, POGORZELSKI. ([id number]) no change in her health 10/24/2017 -- she was seen by Dr. [name] -- after a thorough review he plans a angiographically via the left femoral approach to evaluate her left subclavian artery and address any stenosis at that time. He will also perform bilateral runoff to confirm that a right femoropopliteal as well as a left femoropopliteal bypass graft remained patent. He is then considering a right axillary to femoral bypass graft for limb salvage. ==== 12/18/17-she is here in follow-up evaluation for multiple wounds. She is s/p aortobifemoral and left fem-pop revision. She presents with multiple necrotic areas to the left toes and lateral foot. The wounds we were originally seen her for (right heel and left buttock) are improved/stable. According to her husband the wounds to the left foot are "better" than they were upon discharge from the hospital last Tuesday. He states that Dr. Trula Slade saw her on same day of discharge. She has a follow-up with Dr. Trula Slade on Monday. There are dopplerable pulses to the left dorsalis pedis, posterior tibial, peroneal. We communicated with Dr. Stephens Shire nurse regarding today's findings, photos were sent to her email and the patient will maintain her appointment on Monday. We discussed with the patient and her husband to report to [facility] in [place] for any concerns, changes, new areas of necrosis, new areas or progressive ischemia/purple discoloration, pallor,  new/increased pain. We will anticipate follow up next week 12/30/17 patient unfortunately continues to have multiple wound of her lower extremities and specifically her left toes and lateral foot. She does have regular follow-up visits with Dr. Trula Slade who performed her surgery and at this point they are just holding to wait and see what needs to be done in regard to her foot such as what's going to heal and what may need to have potential amputation of further surgery. For this reason we are just trying to maintain and see were things end up at this point. Fortunately patient is not having any significant discomfort. The right lateral malleolus appears to be doing about the same there is some granulation tissue she does have osteomyelitis and not ulcer. 01/06/18 patient presents today for fault evaluation concerning her ongoing issues with her right lateral ankle, right gluteal region, and left lower extremity. Currently regard to left lower extremity it's possible that she may require some amputation although patient states she has the  appointment follow-up with Dr. Trula Slade next week. That's when she will find out what he thinks needs to be done at that point. Nonetheless in regard to the right lateral ankle this appears to be doing fairly well today. Her right gluteal region actually appears to be doing fairly well with a good wound surface any slough that was present easily cleaned off with saline and gauze. 01/16/18 on evaluation today patient appears to be doing much better actually in regard to her left foot necrotic areas. A lot of this is starting to liftoff as far as the eschar is concerned and there is a lot of healing underneath which is excellent news. I'm actually pleasantly pleased with the results that she's getting in this regard. Fortunately there does not appear to be any evidence of infection which is great news. Overall she has been tolerating the dressing changes without  complication she does have one new ulcer on the right anterior shin due to a small skin tear which is actually healed but then the adhesive from a Band-Aid that was put on it at a doctor's office actually has 20 skin and she says while he has a small ulcer due to that. Fortunately this is superficial. 01/30/18 on evaluation today patient appears to be doing rather well in regard to her left lower extremity ulcerations as well as her right lateral malleolus and gluteal region. She has been tolerating the dressing changes in general without complication. She did see Dr. Trula Slade her vascular surgeon since I last saw her in the good news is he does not really feel like he's gonna have to perform anything surgical in regard to deputation. With that being said I am still somewhat concerned about her left fifth toe we will have to see. Nonetheless overall I do believe she's making progress and her right lateral malleolus appears to have at the allies to over at this point the question will be if this stays covered over or not. Patient History Information obtained from Patient. Family History Diabetes - Mother,Father,Maternal Grandparents,Paternal Grandparents,Child, Heart Disease - [place] Grandparents, Hereditary Spherocytosis - Mother, Hypertension - Mother,Siblings,Paternal Grandparents,Maternal Grandparents, Seizures - Maternal Grandparents,Paternal Grandparents, Thyroid Problems - Siblings, No family history of Cancer, Kidney Disease, Lung Disease, Stroke, Tuberculosis. Social History Current every day smoker, Marital Status - Married, Alcohol Use - Rarely, Drug Use - No History, Caffeine Use - Moderate. LUNDYN, COSTE ([id number]) Review of Systems (ROS) Constitutional Symptoms (General Health) Denies complaints or symptoms of Fever, Chills. Respiratory The patient has no complaints or symptoms. Cardiovascular The patient has no complaints or  symptoms. Psychiatric The patient has no complaints or symptoms. Objective Constitutional Well-nourished and well-hydrated in no acute distress. Vitals Time Taken: 1:20 PM, Height: 67 in, Temperature: 98.2 F, Pulse: 79 bpm, Respiratory Rate: 18 breaths/min, Blood Pressure: 104/63 mmHg. Respiratory normal breathing without difficulty. Psychiatric this patient is able to make decisions and demonstrates good insight into disease process. Alert and Oriented x 3. pleasant and cooperative. General Notes: Patient's wounds and left lateral foot and plantar surface of the foot as well as her toes all appear to be dry eschar without any evidence of fluctuance or wet gangrene. I did trim today selectively some of the edges of the necrotic eschar that was starting to lift up. Patient had no pain and no bleeding with this procedure. Post procedure everything appeared to be a little bit better. No other debridement was performed at this point. Integumentary (Hair, Skin) Wound #10 status is  Open. Original cause of wound was Gradually Appeared. The wound is located on the Left,Circumferential Toe Fifth. The wound measures 2cm length x 1.5cm width x 0.1cm depth; 2.356cm^2 area and 0.236cm^3 volume. There is no tunneling or undermining noted. There is a none present amount of drainage noted. The wound margin is distinct with the outline attached to the wound base. There is no granulation within the wound bed. There is a large (67-100%) amount of necrotic tissue within the wound bed including Eschar. The periwound skin appearance exhibited: Dry/Scaly. The periwound skin appearance did not exhibit: Callus, Crepitus, Excoriation, Induration, Rash, Scarring, Maceration, Atrophie Blanche, Cyanosis, Ecchymosis, Hemosiderin Staining, Mottled, Pallor, Rubor, Erythema. Periwound temperature was noted as No Abnormality. The periwound has tenderness on palpation. Wound #11 status is Open. Original cause of wound was  Trauma. The wound is located on the Right,Medial Lower Leg. The wound measures 1.2cm length x 0.8cm width x 0.1cm depth; 0.754cm^2 area and 0.075cm^3 volume. There is no tunneling or undermining noted. There is a none present amount of drainage noted. The wound margin is flat and intact. There is large (67-100%) red granulation within the wound bed. There is a small (1-33%) amount of necrotic tissue within the wound bed including Eschar. The periwound skin appearance did not exhibit: Callus, Crepitus, Excoriation, Induration, Rash, Scarring, Dry/Scaly, Maceration, Atrophie Blanche, Cyanosis, Ecchymosis, Hemosiderin Staining, Mottled, Pallor, Rubor, Erythema. Periwound temperature was noted as No Abnormality. The periwound has tenderness on palpation. Wound #3 status is Open. Original cause of wound was Pressure Injury. The wound is located on the Right,Lateral Malleolus. The wound measures 0.6cm length x 0.6cm width x 0.2cm depth; 0.283cm^2 area and 0.057cm^3 volume. There is Fat Layer Vice, Tyla S. ([id number]) (Subcutaneous Tissue) Exposed exposed. There is no tunneling or undermining noted. There is a large amount of serous drainage noted. The wound margin is distinct with the outline attached to the wound base. There is medium (34-66%) pink granulation within the wound bed. There is a medium (34-66%) amount of necrotic tissue within the wound bed including Eschar and Adherent Slough. The periwound skin appearance did not exhibit: Callus, Crepitus, Excoriation, Induration, Rash, Scarring, Dry/Scaly, Maceration, Atrophie Blanche, Cyanosis, Ecchymosis, Hemosiderin Staining, Mottled, Pallor, Rubor, Erythema. Periwound temperature was noted as No Abnormality. Wound #4 status is Open. Original cause of wound was Blister. The wound is located on the Left,Lateral Calcaneus. The wound measures 1.1cm length x 1.5cm width x 0.1cm depth; 1.296cm^2 area and 0.13cm^3 volume. There is Fat  Layer (Subcutaneous Tissue) Exposed exposed. There is no tunneling or undermining noted. There is a medium amount of serosanguineous drainage noted. The wound margin is distinct with the outline attached to the wound base. There is small (1-33%) pink granulation within the wound bed. There is a large (67-100%) amount of necrotic tissue within the wound bed including Eschar. The periwound skin appearance exhibited: Callus, Dry/Scaly. The periwound skin appearance did not exhibit: Crepitus, Excoriation, Induration, Rash, Scarring, Maceration, Atrophie Blanche, Cyanosis, Ecchymosis, Hemosiderin Staining, Mottled, Pallor, Rubor, Erythema. Periwound temperature was noted as No Abnormality. Wound #5 status is Open. Original cause of wound was Gradually Appeared. The wound is located on the Left,Circumferential Toe Great. The wound measures 2.6cm length x 2cm width x 0.1cm depth; 4.084cm^2 area and 0.408cm^3 volume. There is no tunneling or undermining noted. There is a none present amount of drainage noted. The wound margin is distinct with the outline attached to the wound base. There is no granulation within the wound bed. There is a  large (67-100%) amount of necrotic tissue within the wound bed including Eschar. The periwound skin appearance did not exhibit: Callus, Crepitus, Excoriation, Induration, Rash, Scarring, Dry/Scaly, Maceration, Atrophie Blanche, Cyanosis, Ecchymosis, Hemosiderin Staining, Mottled, Pallor, Rubor, Erythema. Periwound temperature was noted as No Abnormality. Wound #6 status is Open. Original cause of wound was Gradually Appeared. The wound is located on the Left,Circumferential Toe Fourth. The wound measures 2cm length x 2.2cm width x 0.1cm depth; 3.456cm^2 area and 0.346cm^3 volume. There is no tunneling or undermining noted. There is a large amount of serosanguineous drainage noted. The wound margin is distinct with the outline attached to the wound base. There is no  granulation within the wound bed. There is a large (67-100%) amount of necrotic tissue within the wound bed including Eschar and Adherent Slough. The periwound skin appearance exhibited: Dry/Scaly. The periwound skin appearance did not exhibit: Callus, Crepitus, Excoriation, Induration, Rash, Scarring, Maceration, Atrophie Blanche, Cyanosis, Ecchymosis, Hemosiderin Staining, Mottled, Pallor, Rubor, Erythema. Periwound temperature was noted as No Abnormality. Wound #7 status is Open. Original cause of wound was Gradually Appeared. The wound is located on the Right Gluteus. The wound measures 1.1cm length x 0.6cm width x 0.5cm depth; 0.518cm^2 area and 0.259cm^3 volume. There is Fat Layer (Subcutaneous Tissue) Exposed exposed. There is no tunneling or undermining noted. There is a large amount of serosanguineous drainage noted. The wound margin is distinct with the outline attached to the wound base. There is large (67-100%) red granulation within the wound bed. There is a small (1-33%) amount of necrotic tissue within the wound bed including Adherent Slough. The periwound skin appearance exhibited: Excoriation. The periwound skin appearance did not exhibit: Callus, Crepitus, Induration, Rash, Scarring, Dry/Scaly, Maceration, Atrophie Blanche, Cyanosis, Ecchymosis, Hemosiderin Staining, Mottled, Pallor, Rubor, Erythema. Periwound temperature was noted as No Abnormality. The periwound has tenderness on palpation. Wound #8 status is Open. Original cause of wound was Gradually Appeared. The wound is located on the Left,Lateral Foot. The wound measures 1.8cm length x 2.1cm width x 0.1cm depth; 2.969cm^2 area and 0.297cm^3 volume. There is Fat Layer (Subcutaneous Tissue) Exposed exposed. There is no tunneling or undermining noted. There is a medium amount of serosanguineous drainage noted. The wound margin is distinct with the outline attached to the wound base. There is no granulation within the wound  bed. There is a large (67-100%) amount of necrotic tissue within the wound bed including Eschar. The periwound skin appearance exhibited: Dry/Scaly. The periwound skin appearance did not exhibit: Callus, Crepitus, Excoriation, Induration, Rash, Scarring, Maceration, Atrophie Blanche, Cyanosis, Ecchymosis, Hemosiderin Staining, Mottled, Pallor, Rubor, Erythema. Periwound temperature was noted as No Abnormality. Assessment Active Problems KYLAR, SPEELMAN. 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 There was a Non-Viable Tissue Open Wound/Selective [phone]) debridement with total area of 3 sq cm performed by STONE III, Laurianne Floresca E., PA-C. with the following instrument(s): Forceps and Scissors to remove Non-Viable tissue/material including Eschar after  achieving pain control using Lidocaine 4% Topical Solution. A time out was conducted at 14:03, prior to the start of the procedure. There was no bleeding. The procedure was tolerated well with a pain level of 0 throughout and a pain level of 0 following the procedure. Post Debridement Measurements: 2cm length x 1.5cm width x 0.1cm depth; 0.236cm^3 volume. Character of Wound/Ulcer Post Debridement is improved. Severity of Tissue Post Debridement is: Limited to breakdown of skin. Post  procedure Diagnosis Wound #10: Same as Pre-Procedure Wound #4 Pre-procedure diagnosis of Wound #4 is a Diabetic Wound/Ulcer of the Lower Extremity located on the Left,Lateral Calcaneus .Severity of Tissue Pre Debridement is: Limited to breakdown of skin. There was a Non-Viable Tissue Open Wound/Selective [phone]) debridement with total area of 1.65 sq cm performed by STONE III, Willeen Novak E., PA-C. with the following instrument(s): Forceps and Scissors to remove Non-Viable tissue/material including Eschar after achieving pain control using Lidocaine 4% Topical Solution. A time out was conducted at 14:04, prior to the start of the procedure. There was no bleeding. The procedure was tolerated well with a pain level of 0 throughout and a pain level of 0 following the procedure. Post Debridement Measurements: 1.1cm length x 1.5cm width x 0.1cm depth; 0.13cm^3 volume. Character of Wound/Ulcer Post Debridement is improved. Severity of Tissue Post Debridement is: Limited to breakdown of skin. Post procedure Diagnosis Wound #4: Same as Pre-Procedure Wound #8 Pre-procedure diagnosis of Wound #8 is a Diabetic Wound/Ulcer of the Lower Extremity located on the Left,Lateral Foot .Severity of Tissue Pre Debridement is: Limited to breakdown of skin. There was a Non-Viable Tissue Open Wound/Selective [phone]) debridement with total area of 3.78 sq cm performed by STONE III, Dorcus Riga E., PA-C. with the following instrument(s): Forceps and Scissors to remove Non-Viable tissue/material including Eschar after achieving pain control using Lidocaine 4% Topical Solution. A time out was conducted at 14:06, prior to the start of the procedure. There was no bleeding. The procedure was tolerated well with a pain level of 0 throughout and a pain level of 0 following the procedure. Post Debridement Measurements: 1.8cm length x 2.1cm width x 0.1cm depth; 0.297cm^3 volume. Character of Wound/Ulcer Post Debridement is improved.  Severity of Tissue Post Debridement is: Fat layer exposed. Post procedure Diagnosis Wound #8: Same as Pre-Procedure Plan Boutwell, [place] ([id number]) Wound Cleansing: Wound #10 Left,Circumferential Toe Fifth: Clean wound with Normal Saline. Wound #11 Right,Medial Lower Leg: Clean wound with Normal Saline. Wound #3 Right,Lateral Malleolus: Clean wound with Normal Saline. Wound #4 Left,Lateral Calcaneus: Clean wound with Normal Saline. Wound #5 Left,Circumferential Toe Great: Clean wound with Normal Saline. Wound #6 Left,Circumferential Toe Fourth: Clean wound with Normal Saline. Wound #7 Right Gluteus: Clean wound with Normal Saline. Wound #8 Left,Lateral Foot: Clean wound with Normal Saline. Anesthetic (add to Medication List): Wound #11 Right,Medial Lower Leg: Topical Lidocaine 4% cream applied to wound bed prior to debridement (In Clinic Only). Wound #4 Left,Lateral Calcaneus: Topical Lidocaine 4% cream applied to wound bed prior to debridement (In Clinic Only). Wound #7 Right Gluteus: Topical Lidocaine 4% cream applied to wound bed prior to debridement (In Clinic Only). Skin Barriers/Peri-Wound Care: Wound #10 Left,Circumferential Toe Fifth: Skin Prep Wound #11 Right,Medial Lower Leg: Skin Prep Wound #3 Right,Lateral Malleolus: Skin Prep Wound #4 Left,Lateral Calcaneus: Skin Prep Wound #5 Left,Circumferential Toe Great: Skin Prep Wound #6 Left,Circumferential Toe Fourth: Skin Prep Wound #7 Right Gluteus: Skin Prep Wound #8 Left,Lateral Foot: Skin Prep Primary Wound Dressing: Wound #10 Left,Circumferential Toe Fifth:  Other: - betadine or providone-iodine (paint on wound) - silver alginate between 4th and 5th toes on left foot Wound #11 Right,Medial Lower Leg: Other: - betadine or providone-iodine (paint on wound) - silver alginate between 4th and 5th toes on left foot Wound #4 Left,Lateral Calcaneus: Other: - betadine or providone-iodine (paint on wound) - silver  alginate between 4th and 5th toes on left foot Wound #5 Left,Circumferential Toe Great: Other: - betadine or providone-iodine (paint on wound) - silver alginate between 4th and 5th toes on left foot Wound #6 Left,Circumferential Toe Fourth: Other: - betadine or providone-iodine (paint on wound) - silver alginate between 4th and 5th toes on left foot Wound #8 Left,Lateral Foot: Other: - betadine or providone-iodine (paint on wound) - silver alginate between 4th and 5th toes on left foot Wound #7 Right Gluteus: Prisma Ag Drawtex Wound #3 Right,Lateral Malleolus: Non-adherent pad Jurek, Roniqua S. ([id number]) Secondary Dressing: Wound #10 Left,Circumferential Toe Fifth: ABD pad Conform/Kerlix Wound #11 Right,Medial Lower Leg: ABD pad Conform/Kerlix Wound #3 Right,Lateral Malleolus: Boardered Foam Dressing Wound #4 Left,Lateral Calcaneus: ABD pad Conform/Kerlix Wound #5 Left,Circumferential Toe Great: ABD pad Conform/Kerlix Wound #6 Left,Circumferential Toe Fourth: ABD pad Conform/Kerlix Wound #7 Right Gluteus: Boardered Foam Dressing Wound #8 Left,Lateral Foot: ABD pad Conform/Kerlix Dressing Change Frequency: Wound #10 Left,Circumferential Toe Fifth: Change dressing every other day. Other: - PRN Wound #11 Right,Medial Lower Leg: Change dressing every other day. Other: - PRN Wound #3 Right,Lateral Malleolus: Change dressing every other day. Other: - PRN Wound #4 Left,Lateral Calcaneus: Change dressing every other day. Other: - PRN Wound #5 Left,Circumferential Toe Great: Change dressing every other day. Other: - PRN Wound #6 Left,Circumferential Toe Fourth: Change dressing every other day. Other: - PRN Wound #7 Right Gluteus: Change dressing every other day. Other: - PRN Wound #8 Left,Lateral Foot: Change dressing every other day. Other: - PRN Follow-up Appointments: Wound #10 Left,Circumferential Toe Fifth: Return Appointment in 1 week. Wound #11  Right,Medial Lower Leg: Return Appointment in 1 week. Wound #3 Right,Lateral Malleolus: Return Appointment in 1 week. Wound #4 Left,Lateral Calcaneus: Return Appointment in 1 week. Wound #5 Left,Circumferential Toe Great: Return Appointment in 1 week. Wound #6 Left,Circumferential Toe FourthDARREL, BARONI. ([id number]) Return Appointment in 1 week. Wound #7 Right Gluteus: Return Appointment in 1 week. Wound #8 Left,Lateral Foot: Return Appointment in 1 week. Off-Loading: Wound #7 Right Gluteus: Turn and reposition every 2 hours Additional Orders / Instructions: Wound #10 Left,Circumferential Toe Fifth: Increase protein intake. Wound #11 Right,Medial Lower Leg: Increase protein intake. Wound #3 Right,Lateral Malleolus: Increase protein intake. Wound #4 Left,Lateral Calcaneus: Increase protein intake. Wound #5 Left,Circumferential Toe Great: Increase protein intake. Wound #6 Left,Circumferential Toe Fourth: Increase protein intake. Wound #7 Right Gluteus: Increase protein intake. Wound #8 Left,Lateral Foot: Increase protein intake. Home Health: Wound #10 Left,Circumferential Toe Fifth: [place] Nurse may visit PRN to address patient s wound care needs. FACE TO FACE ENCOUNTER: MEDICARE and MEDICAID PATIENTS: I certify that this patient is under my care and that I had a face-to-face encounter that meets the physician face-to-face encounter requirements with this patient on this date. The encounter with the patient was in whole or in part for the following MEDICAL CONDITION: (primary reason for [place]) MEDICAL NECESSITY: I certify, that based on my findings, NURSING services are a medically necessary home health service. HOME BOUND STATUS: I certify that my clinical findings support that this patient is homebound (i.e., Due to illness or injury, pt  requires aid of supportive devices such as crutches, cane, wheelchairs, walkers, the use  of special transportation or the assistance of another person to leave their place of residence. There is a normal inability to leave the home and doing so requires considerable and taxing effort. Other absences are for medical reasons / religious services and are infrequent or of short duration when for other reasons). If current dressing causes regression in wound condition, may D/C ordered dressing product/s and apply Normal Saline Moist Dressing daily until next [employer] / Other MD appointment. [place] of regression in wound condition at [phone]. Please direct any NON-WOUND related issues/requests for orders to patient's Primary Care Physician Wound #11 Right,Medial Lower Leg: [place] Nurse may visit PRN to address patient s wound care needs. FACE TO FACE ENCOUNTER: MEDICARE and MEDICAID PATIENTS: I certify that this patient is under my care and that I had a face-to-face encounter that meets the physician face-to-face encounter requirements with this patient on this date. The encounter with the patient was in whole or in part for the following MEDICAL CONDITION: (primary reason for [place]) MEDICAL NECESSITY: I certify, that based on my findings, NURSING services are a medically necessary home health service. HOME BOUND STATUS: I certify that my clinical findings support that this patient is homebound (i.e., Due to illness or injury, pt requires aid of supportive devices such as crutches, cane, wheelchairs, walkers, the use of special transportation or the assistance of another person to leave their place of residence. There is a normal inability to leave the home and doing so requires considerable and taxing effort. Other absences are for medical reasons / religious services and are infrequent or of short duration when for other reasons). If current dressing causes regression in wound condition, may D/C ordered  dressing product/s and apply Normal Saline Moist Dressing daily until next [place] / Other MD appointment. [place] of regression in wound condition at [phone]. Please direct any NON-WOUND related issues/requests for orders to patient's Primary Care Physician Wound #3 Right,Lateral Malleolus: [place] Visits ORIYA, KETTERING ([id number]) [place] Nurse may visit PRN to address patient s wound care needs. FACE TO FACE ENCOUNTER: MEDICARE and MEDICAID PATIENTS: I certify that this patient is under my care and that I had a face-to-face encounter that meets the physician face-to-face encounter requirements with this patient on this date. The encounter with the patient was in whole or in part for the following MEDICAL CONDITION: (primary reason for [place]) MEDICAL NECESSITY: I certify, that based on my findings, NURSING services are a medically necessary home health service. HOME BOUND STATUS: I certify that my clinical findings support that this patient is homebound (i.e., Due to illness or injury, pt requires aid of supportive devices such as crutches, cane, wheelchairs, walkers, the use of special transportation or the assistance of another person to leave their place of residence. There is a normal inability to leave the home and doing so requires considerable and taxing effort. Other absences are for medical reasons / religious services and are infrequent or of short duration when for other reasons). If current dressing causes regression in wound condition, may D/C ordered dressing product/s and apply Normal Saline Moist Dressing daily until next [place] / Other MD appointment. [place] of regression in wound condition at [phone]. Please direct any NON-WOUND related issues/requests for orders to patient's Primary Care Physician Wound #  4 Left,Lateral Calcaneus: [place] Nurse may visit PRN to address patient s wound care needs. FACE TO FACE ENCOUNTER: MEDICARE and MEDICAID PATIENTS: I certify that this patient is under my care and that I had a face-to-face encounter that meets the physician face-to-face encounter requirements with this patient on this date. The encounter with the patient was in whole or in part for the following MEDICAL CONDITION: (primary reason for [place]) MEDICAL NECESSITY: I certify, that based on my findings, NURSING services are a medically necessary home health service. HOME BOUND STATUS: I certify that my clinical findings support that this patient is homebound (i.e., Due to illness or injury, pt requires aid of supportive devices such as crutches, cane, wheelchairs, walkers, the use of special transportation or the assistance of another person to leave their place of residence. There is a normal inability to leave the home and doing so requires considerable and taxing effort. Other absences are for medical reasons / religious services and are infrequent or of short duration when for other reasons). If current dressing causes regression in wound condition, may D/C ordered dressing product/s and apply Normal Saline Moist Dressing daily until next [place] / Other MD appointment. [place] of regression in wound condition at [phone]. Please direct any NON-WOUND related issues/requests for orders to patient's Primary Care Physician Wound #5 Left,Circumferential Toe Great: [place] Nurse may visit PRN to address patient s wound care needs. FACE TO FACE ENCOUNTER: MEDICARE and MEDICAID PATIENTS: I certify that this patient is under my care and that I had a face-to-face encounter that meets the physician face-to-face encounter requirements with this patient on this date. The encounter with the patient was in whole or in part for the following MEDICAL CONDITION:  (primary reason for [place]) MEDICAL NECESSITY: I certify, that based on my findings, NURSING services are a medically necessary home health service. HOME BOUND STATUS: I certify that my clinical findings support that this patient is homebound (i.e., Due to illness or injury, pt requires aid of supportive devices such as crutches, cane, wheelchairs, walkers, the use of special transportation or the assistance of another person to leave their place of residence. There is a normal inability to leave the home and doing so requires considerable and taxing effort. Other absences are for medical reasons / religious services and are infrequent or of short duration when for other reasons). If current dressing causes regression in wound condition, may D/C ordered dressing product/s and apply Normal Saline Moist Dressing daily until next [place] / Other MD appointment. [place] of regression in wound condition at [phone]. Please direct any NON-WOUND related issues/requests for orders to patient's Primary Care Physician Wound #6 Left,Circumferential Toe Fourth: [place] Nurse may visit PRN to address patient s wound care needs. FACE TO FACE ENCOUNTER: MEDICARE and MEDICAID PATIENTS: I certify that this patient is under my care and that I had a face-to-face encounter that meets the physician face-to-face encounter requirements with this patient on this date. The encounter with the patient was in whole or in part for the following MEDICAL CONDITION: (primary reason for [place]) MEDICAL NECESSITY: I certify, that based on my findings, NURSING services are a medically necessary home health service. HOME BOUND STATUS: I certify that my clinical findings support that this patient is homebound (i.e., Due to illness or injury, pt requires aid of supportive  devices such as crutches, cane, wheelchairs, walkers, the use of  special transportation or the assistance of another person to leave their place of residence. There is a normal inability to leave the home and doing so requires considerable and taxing effort. Other absences are for medical reasons / religious services and are infrequent or of short duration when for other reasons). If current dressing causes regression in wound condition, may D/C ordered dressing product/s and apply Normal Saline Moist Dressing daily until next [place] / Other MD appointment. [place] of regression in [place], ETHLYN ALTO. ([id number]) wound condition at [phone]. Please direct any NON-WOUND related issues/requests for orders to patient's Primary Care Physician Wound #7 Right Gluteus: [place] Nurse may visit PRN to address patient s wound care needs. FACE TO FACE ENCOUNTER: MEDICARE and MEDICAID PATIENTS: I certify that this patient is under my care and that I had a face-to-face encounter that meets the physician face-to-face encounter requirements with this patient on this date. The encounter with the patient was in whole or in part for the following MEDICAL CONDITION: (primary reason for [place]) MEDICAL NECESSITY: I certify, that based on my findings, NURSING services are a medically necessary home health service. HOME BOUND STATUS: I certify that my clinical findings support that this patient is homebound (i.e., Due to illness or injury, pt requires aid of supportive devices such as crutches, cane, wheelchairs, walkers, the use of special transportation or the assistance of another person to leave their place of residence. There is a normal inability to leave the home and doing so requires considerable and taxing effort. Other absences are for medical reasons / religious services and are infrequent or of short duration when for other reasons). If current dressing causes regression in wound condition, may  D/C ordered dressing product/s and apply Normal Saline Moist Dressing daily until next [place] / Other MD appointment. [place] of regression in wound condition at [phone]. Please direct any NON-WOUND related issues/requests for orders to patient's Primary Care Physician Wound #8 Left,Lateral Foot: [place] Nurse may visit PRN to address patient s wound care needs. FACE TO FACE ENCOUNTER: MEDICARE and MEDICAID PATIENTS: I certify that this patient is under my care and that I had a face-to-face encounter that meets the physician face-to-face encounter requirements with this patient on this date. The encounter with the patient was in whole or in part for the following MEDICAL CONDITION: (primary reason for [place]) MEDICAL NECESSITY: I certify, that based on my findings, NURSING services are a medically necessary home health service. HOME BOUND STATUS: I certify that my clinical findings support that this patient is homebound (i.e., Due to illness or injury, pt requires aid of supportive devices such as crutches, cane, wheelchairs, walkers, the use of special transportation or the assistance of another person to leave their place of residence. There is a normal inability to leave the home and doing so requires considerable and taxing effort. Other absences are for medical reasons / religious services and are infrequent or of short duration when for other reasons). If current dressing causes regression in wound condition, may D/C ordered dressing product/s and apply Normal Saline Moist Dressing daily until next [place] / Other MD appointment. [place] of regression in wound condition at [phone]. Please direct any NON-WOUND related issues/requests for orders to patient's Primary Care Physician Currently I'm going to suggest that  we continue with the Current wound care measures and she  seems to be doing well I think this is appropriate. Patient is likewise in agreement with the plan. We will see were things stand at follow-up. Please see above for specific wound care orders. We will see patient for re-evaluation in 2 week(s) here in the clinic. If anything worsens or changes patient will contact our office for additional recommendations. Electronic Signature(s) Signed: 01/30/2018 6:13:52 PM By: Worthy Keeler PA-C Entered By: Worthy Keeler on 01/30/2018 16:26:23 CLEASTER, SHIFFER ([id number]) -------------------------------------------------------------------------------- ROS/PFSH Details Patient Name: JUDY, POLLMAN. Date of Service: 01/30/2018 1:00 PM Medical Record Number: [id number] Patient Account Number: [id number] Date of Birth/Sex: [birth year] (52 y.o. Female) Treating RN: Montey Hora Primary Care Provider: Delight Stare Other Clinician: Referring Provider: Delight Stare Treating Provider/Extender: Melburn Hake, Shonte Beutler Weeks in Treatment: 6 Information Obtained From Patient Wound History Do you currently have one or more open woundso Yes How many open wounds do you currently haveo 4 Approximately how long have you had your woundso 1 month and other 7 yrs How have you been treating your wound(s) until nowo gauze Has your wound(s) ever healed and then re-openedo No Have you had any lab work done in the past montho No Have you tested positive for an antibiotic resistant organism (MRSA, VRE)o No Have you tested positive for osteomyelitis (bone infection)o Yes Have you had any tests for circulation on your legso Yes Have you had other problems associated with your woundso Swelling Constitutional Symptoms (General Health) Complaints and Symptoms: Negative for: Fever; Chills Eyes Medical History: Negative for: Cataracts; Glaucoma; Optic Neuritis Ear/Nose/Mouth/Throat Medical History: Negative for: Chronic sinus problems/congestion; Middle ear  problems Hematologic/Lymphatic Medical History: Positive for: Anemia Negative for: Hemophilia; Human Immunodeficiency Virus; Lymphedema; Sickle Cell Disease Respiratory Complaints and Symptoms: No Complaints or Symptoms Medical History: Negative for: Aspiration; Asthma; Chronic Obstructive Pulmonary Disease (COPD); Pneumothorax; Sleep Apnea; Tuberculosis Cardiovascular Complaints and Symptoms: No Complaints or Symptoms Medical History: AIANNA, FAHS. ([id number]) Positive for: Arrhythmia - a-fib; Coronary Artery Disease; Hypotension; Peripheral Arterial Disease Gastrointestinal Medical History: Negative for: Cirrhosis ; Colitis; Crohnos; Hepatitis A; Hepatitis B; Hepatitis C Endocrine Medical History: Positive for: Type II Diabetes Treated with: Insulin, Oral agents Blood sugar tested every day: Yes Tested : 4 times a day Blood sugar testing results: Bedtime: 190 Genitourinary Medical History: Negative for: End Stage Renal Disease Immunological Medical History: Negative for: Lupus Erythematosus; Raynaudos; Scleroderma Integumentary (Skin) Medical History: Negative for: History of Burn; History of pressure wounds Musculoskeletal Medical History: Positive for: Osteoarthritis Neurologic Medical History: Negative for: Dementia; Neuropathy; Quadriplegia; Paraplegia; Seizure Disorder Oncologic Medical History: Negative for: Received Chemotherapy; Received Radiation Psychiatric Complaints and Symptoms: No Complaints or Symptoms Medical History: Negative for: Anorexia/bulimia; Confinement Anxiety Immunizations Pneumococcal Vaccine: Received Pneumococcal Vaccination: KAMILLE, TOOMEY ([id number]) Implantable Devices Family and Social History Cancer: No; Diabetes: Yes - Mother,Father,Maternal Grandparents,Paternal Grandparents,Child; Heart Disease: Yes - Father,Mother,Maternal Grandparents,Paternal Grandparents; Hereditary Spherocytosis: Yes - Mother; Hypertension:  Yes - Mother,Siblings,Paternal Grandparents,Maternal Grandparents; Kidney Disease: No; Lung Disease: No; Seizures: Yes - Maternal Grandparents,Paternal Grandparents; Stroke: No; Thyroid Problems: Yes - Siblings; Tuberculosis: No; Current every day smoker; Marital Status - Married; Alcohol Use: Rarely; Drug Use: No History; Caffeine Use: Moderate; Financial Concerns: No; Food, Clothing or Shelter Needs: No; Support System Lacking: No; Transportation Concerns: No; Advanced Directives: No; Patient does not want information on Advanced Directives; Do not resuscitate: No; Living Will: No; Medical Power of Attorney: No Physician Affirmation I have  reviewed and agree with the above information. Electronic Signature(s) Signed: 01/30/2018 6:13:52 PM By: Worthy Keeler PA-C Signed: 02/02/2018 4:59:43 PM By: Montey Hora Entered By: Worthy Keeler on 01/30/2018 16:23:21 Vader, Jamella Chauncey Cruel ([id number]) -------------------------------------------------------------------------------- SuperBill Details Patient Name: [name]. Date of Service: 01/30/2018 Medical Record Number: [id number] Patient Account Number: [id number] Date of Birth/Sex: 06/20/1966 (52 y.o. 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 S31.819S Unspecified open wound of right buttock, sequela L97.312 Non-pressure chronic ulcer of right ankle with fat layer exposed Facility Procedures CPT4 Code Description:  [id number] - [place]BR[place] WOUND 1ST 20 SQ CM OR < ICD-10 Diagnosis Description L97.528 Non-pressure chronic ulcer of other part of left foot with other s Modifier: pecified severit Quantity: 1 y Physician Procedures CPT4 Code Description: [id number] - WC PHYS DEBR WO ANESTH 20 SQ CM ICD-10 Diagnosis Description L97.528 Non-pressure chronic ulcer of other part of left foot with other s Modifier: pecified severit Quantity: 1 y Engineer, maintenance) Signed: 01/30/2018 6:13:52 PM By: Worthy Keeler PA-C Entered By: Worthy Keeler on 01/30/2018 16:26:38

## 2018-02-13 ENCOUNTER — Encounter: Payer: Medicare Other | Admitting: Physician Assistant

## 2018-02-13 DIAGNOSIS — E11622 Type 2 diabetes mellitus with other skin ulcer: Secondary | ICD-10-CM | POA: Diagnosis not present

## 2018-02-15 NOTE — Progress Notes (Signed)
CARYLON, TAMBURRO ([id number]) Visit Report for 02/13/2018 Chief Complaint Document Details Patient Name: JOYA, [name]. Date of Service: 02/13/2018 1:30 PM Medical Record Number: [id number] Patient Account Number: [id number] Date of Birth/Sex: [birth year] (52 y.o. F) Treating RN: Montey Hora Primary Care Provider: Delight Stare Other Clinician: Referring Provider: Delight Stare Treating Provider/Extender: Melburn Hake, Jennaya Pogue Weeks in Treatment: 8 Information Obtained from: Patient Chief Complaint She is here in follow up evaluation for multiple wounds Electronic Signature(s) Signed: 02/13/2018 6:18:22 PM By: Worthy Keeler PA-C Entered By: Worthy Keeler on 02/13/2018 13:28:38 Rudin, Shaunie Chauncey Cruel ([id number]) -------------------------------------------------------------------------------- Debridement Details Patient Name: [name] Date of Service: 02/13/2018 1:30 PM Medical Record Number: [id number] Patient Account Number: [id number] Date of Birth/Sex: 04/29/1966 (52 y.o. F) Treating RN: Montey Hora Primary Care Provider: Delight Stare Other Clinician: Referring Provider: Delight Stare Treating Provider/Extender: STONE III, Yoseline Andersson Weeks in Treatment: 8 Debridement Performed for Wound #10 Left,Circumferential Toe Fifth Assessment: Performed By: Physician STONE III, Dearius Hoffmann E., PA-C Debridement Type: Debridement Severity of Tissue Pre Fat layer exposed Debridement: Pre-procedure Verification/Time Yes - 14:15 Out Taken: Start Time: 14:15 Pain Control: Lidocaine 4% Topical Solution Total Area Debrided (L x W): 0.6 (cm) x 0.6 (cm) = 0.36 (cm) Tissue and other material Non-Viable, Eschar debrided: Level: Non-Viable Tissue Debridement Description: Selective/Open Wound Instrument: Forceps, Scissors Bleeding: None End Time: 14:16 Procedural Pain: 0 Post Procedural Pain: 0 Response to Treatment: Procedure was tolerated well Post Debridement Measurements of Total Wound Length:  (cm) 2 Width: (cm) 1.2 Depth: (cm) 0.1 Volume: (cm) 0.188 Character of Wound/Ulcer Post Debridement: Improved Severity of Tissue Post Debridement: Fat layer exposed Post Procedure Diagnosis Same as Pre-procedure Electronic Signature(s) Signed: 02/13/2018 4:53:17 PM By: Montey Hora Signed: 02/13/2018 6:18:22 PM By: Worthy Keeler PA-C Entered By: Montey Hora on 02/13/2018 14:16:45 Lizana, Leianna S. ([id number]) -------------------------------------------------------------------------------- Debridement Details Patient Name: [name] Date of Service: 02/13/2018 1:30 PM Medical Record Number: [id number] Patient Account Number: [id number] Date of Birth/Sex: Apr 02, 1966 (52 y.o. F) Treating RN: Montey Hora Primary Care Provider: Delight Stare Other Clinician: Referring Provider: Delight Stare Treating Provider/Extender: STONE III, Jaivion Kingsley Weeks in Treatment: 8 Debridement Performed for Wound #5 Left,Circumferential Toe Great Assessment: Performed By: Physician STONE III, Zaylyn Bergdoll E., PA-C Debridement Type: Debridement Severity of Tissue Pre Fat layer exposed Debridement: Pre-procedure Verification/Time Yes - 14:16 Out Taken: Start Time: 14:16 Pain Control: Lidocaine 4% Topical Solution Total Area Debrided (L x W): 1 (cm) x 1 (cm) = 1 (cm) Tissue and other material Non-Viable, Eschar debrided: Level: Non-Viable Tissue Debridement Description: Selective/Open Wound Instrument: Forceps, Scissors Bleeding: None End Time: 14:17 Procedural Pain: 0 Post Procedural Pain: 0 Response to Treatment: Procedure was tolerated well Post Debridement Measurements of Total Wound Length: (cm) 2.4 Width: (cm) 2 Depth: (cm) 0.1 Volume: (cm) 0.377 Character of Wound/Ulcer Post Debridement: Improved Severity of Tissue Post Debridement: Fat layer exposed Post Procedure Diagnosis Same as Pre-procedure Electronic Signature(s) Signed: 02/13/2018 4:53:17 PM By: Montey Hora Signed:  02/13/2018 6:18:22 PM By: Worthy Keeler PA-C Entered By: Montey Hora on 02/13/2018 14:18:27 Puchalski, Rayelle S. ([id number]) -------------------------------------------------------------------------------- HPI Details Patient Name: [name] Date of Service: 02/13/2018 1:30 PM Medical Record Number: [id number] Patient Account Number: [id number] Date of Birth/Sex: 29-Jan-1966 (52 y.o. F) Treating RN: Montey Hora Primary Care Provider: Delight Stare Other Clinician: Referring Provider: Delight Stare Treating Provider/Extender: Melburn Hake, Crystalee Ventress Weeks in Treatment: 8 History of Present Illness HPI Description: 52 year old patient was sent to [place] from [facility]  Center where she was seen by Dr. Sherril Cong for a left ankle ulceration and was recently hospitalized with hypotension and sepsis. She was treated with IV antibiotics and has been scheduled to see Dr. Sharol Given. She was seen by vascular surgery who recommended a femoral bypass but surgery has been delayed until her sacral wound from last year's necrotizing fasciitis has healed. Was seeing the wound care team at [facility] but wanted to change over. She is a smoker and smokes a pack of cigarettes a day The patient was recently admitted in [place] between February 2 and February 14. She had a follow-up to see vascular surgery, orthopedic surgery and infectious disease. 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 Review of the electronic medical records reveals that Dr. 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  An MRI showed edema in the bone which was consistent with osteomyelitis and the chronic ulcer and may need surgical intervention. She also had a sacral decubitus ulcer which was treated with the wound VAC. On 01/07/2017 she was taken up by the vascular surgeon Dr. Trula Slade for an abdominal aortogram and bilateral lower extremity runoff, for a history of having bilateral femoropopliteal bypass graft as well as external iliac stenting on the right and stenting of her bypass graft. She had developed a nonhealing wound on her right ankle and there was a possibility of a femoral occlusion. the findings were noted and the impression was a surgical revascularization with a aorto bifemoral bypass graft. Both the femoropopliteal bypass grafts were patent. 01/31/2017 -- x-ray of the right hip and pelvis -- IMPRESSION:No radiographic evidence of acute osteomyelitis. Normal-appearing right hip joint space for age. No acute bony abnormality of the hip. 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 Since seeing her last week she has not had an appointment with infectious disease or the orthopedic specialist yet. 02/06/2017 -- the patient missed a couple of appointments yesterday due to the weather but other than that has apparently given up smoking for the last 5 days. 02/13/2017 -- she has rescheduled her infectious disease appointment and also the appointment with the orthopedic surgeon Dr. Sharol Given 03/06/2017 -- she was seen by Dr. Lucianne Lei dam of infectious disease on 03/05/2017 and he recommendedIV antibiotics but the patient did not want to have that and he has given her Augmentin and doxycycline and will reevaluate her in 2 months time. 03/13/2017 -- he saw Dr. Trula Slade regarding her vascular issues and he would like to wait to the sacral wound is completely closed before considering aortobifemoral bypass graft. He will see her back in 2 months time. She was  also seen by Dr. Sharol Given of orthopedics who recommended continue wound care dressings and follow in the office in about 2 months time. he also recommended 3 view radiographs of the right ankle at follow-up. 03/28/2017 -- she did have a PICC line placed and Dr. Lucianne Lei dam has begun IV vancomycin and ceftriaxone. she is going to continue this until she sees him in approximately a month's time 04/18/2017 -- we had applied for a skin  substitute for the patient's care but her copayment is going to be about $300 a piece and the patient will not be able to afford this. 05/08/17 on evaluation today patient appears to potentially have a fungal rash in the periwound region of the sacral area. This also seems to extend into the inguinal creases and factional region as well. She is tender to palpation with light rubbing over the region of the periwound and the skin in this region does have a beefy red appearance. Everything seems to point to a fungal infection at this time. She has been tolerating the dressing changes up to this point. There is no evidence of infection otherwise. 05/16/2017 -- was seen by Dr. Rhina Brackett dam of infectious disease -- he saw for the chronic wound over her right ankle Kuznia, [place]. ([id number]) with osteomyelitis of the fibula. He did not think that she is making progress and was not able to examine her sacral decubitus ulcer as the patient would not allow it. She was switched to Bactrim DS 2 tablets twice a day since finishing her antibiotics. Her ESR was 76 and a CRP was 2.8. 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 05/23/17 on evaluation today patient's wound appears to be doing about the same in regard to the ankle as well as the gluteal area. She apparently has issues with the one back staying in place and she states the sill seems to break up the inferior aspect. She does  have home help coming out to apply the wound VAC. She has been tolerating the dressing changes without any problem although she has had some increased pain in the gluteal region due to a fall she sustained and she feels like she brews that area and she had pain actually radiating down her leg which is slowly beginning to improve. There's no evidence of infection. 05/29/17 Unfortunately on evaluation today patient's wounds both in regard to the ankle as well as in regard to her gluteal region on the right appear to be measuring the same as last week's evaluation. She also tells me that she is having soreness today in regard to the right gluteal area because she had a visitor who came to see her a couple days ago and stayed for six hours and she had to set up for that entire length of time visiting. This has called some increased discomfort. Normally she does not set up for those long periods of time. Fortunately there's no evidence of infection and no worsening of the wounds. 06/13/2017 -- the patient has had a lot of health issues including perfuse diarrhea and generalized weakness and I have instructed her to see her PCP as soon as possible and if things get worse to go to the ER. She has not brought her wound VAC today. She continues to be on oral antibiotics 06/30/17- she is here in follow up evaluation for right ischial and right lateral malleolus ulcer. She continues with negative pressure wound therapy to the ischial wound. She is currently on a diabetic therapy per infectious disease, although she does state that she inconsistently takes it. She was advised to take her antibiotic as prescribed 07/11/2017 -- she has been unable to use a wound VAC regularly because she loses this evening and it has not been possible for her to use it. 07/18/2017 -- she is much more comfortable and looks better without the  wound VAC and at this stage I believe it will be better for her to return the machine and  continue with local care. 08/07/2017 -- she is back up to 3 weeks due to a vacation and during this time her wound VAC was returned. She has been doing local dressings 08/15/17 on evaluation today patient appears to be doing about the same in regard to her ankle as well as right gluteal wound. She has been tolerating the dressing changes and tells me that it's mainly her gluteal wound that hurts the ankle is nontender to palpation. She rates this pain to be a one out of 10 at rest and with cleansing of the wound five out of 10 at least. No fevers, chills, nausea, or vomiting noted at this time. She does note that home health has told her to inquire about possibly using silver nitrate on the wound edges due to the road nature of the edges. 08/29/2017 -- -- I got a note which Dr. Lucianne Lei dam sent to Dr. 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 He had recommended definitive surgery to fix this. He recommended to continue with Bactrim Right ankle x-ray -- IMPRESSION: Findings compatible with cellulitis of the lateral malleolar region. A small focus of bony destruction involving the tip of the lateral malleolus is worrisome for osteomyelitis. 09/05/2017 -- the patient was seen by Dr. Meridee Score yesterday who thought her wound looked good and he would continue local care with silver alginate dressing and follow-up in 4 weeks. He did not recommend any surgical intervention of the ankle until there is good arterial flow and he would await Dr. Stephens Shire opinion regarding this. She was also seen by infectious disease Dr. Lucianne Lei dam, who was concerned about her elevated potassium and took her off Bactrim. he has recommended doxycycline. 09/26/2017 -- she is back after 3 weeks as she was out of town on vacation. She has put on some weight but other than that no change in her health 10/24/2017 -- she was seen by Dr. [name] --  after a thorough review he plans a angiographically via the left femoral approach to evaluate her left subclavian artery and address any stenosis at that time. He will also perform bilateral runoff to confirm that a right femoropopliteal as well as a left femoropopliteal bypass graft remained patent. He is then considering a right axillary to femoral bypass graft for limb salvage. ZANAYA, BAIZE ([id number]) ==== 12/18/17-she is here in follow-up evaluation for multiple wounds. She is s/p aortobifemoral and left fem-pop revision. She presents with multiple necrotic areas to the left toes and lateral foot. The wounds we were originally seen her for (right heel and left buttock) are improved/stable. According to her husband the wounds to the left foot are "better" than they were upon discharge from the hospital last Tuesday. He states that Dr. Trula Slade saw her on same day of discharge. She has a follow-up with Dr. Trula Slade on Monday. There are dopplerable pulses to the left dorsalis pedis, posterior tibial, peroneal. We communicated with Dr. Stephens Shire nurse regarding today's findings, photos were sent to her email and the patient will maintain her appointment on Monday. We discussed with the patient and her husband to report to [facility] in [place] for any concerns, changes, new areas of necrosis, new areas or progressive ischemia/purple discoloration, pallor, new/increased pain. We will anticipate follow up next week 12/30/17 patient unfortunately continues to have multiple wound  of her lower extremities and specifically her left toes and lateral foot. She does have regular follow-up visits with Dr. Trula Slade who performed her surgery and at this point they are just holding to wait and see what needs to be done in regard to her foot such as what's going to heal and what may need to have potential amputation of further surgery. For this reason we are just trying to maintain and see were things end  up at this point. Fortunately patient is not having any significant discomfort. The right lateral malleolus appears to be doing about the same there is some granulation tissue she does have osteomyelitis and not ulcer. 01/06/18 patient presents today for fault evaluation concerning her ongoing issues with her right lateral ankle, right gluteal region, and left lower extremity. Currently regard to left lower extremity it's possible that she may require some amputation although patient states she has the appointment follow-up with Dr. Trula Slade next week. That's when she will find out what he thinks needs to be done at that point. Nonetheless in regard to the right lateral ankle this appears to be doing fairly well today. Her right gluteal region actually appears to be doing fairly well with a good wound surface any slough that was present easily cleaned off with saline and gauze. 01/16/18 on evaluation today patient appears to be doing much better actually in regard to her left foot necrotic areas. A lot of this is starting to liftoff as far as the eschar is concerned and there is a lot of healing underneath which is excellent news. I'm actually pleasantly pleased with the results that she's getting in this regard. Fortunately there does not appear to be any evidence of infection which is great news. Overall she has been tolerating the dressing changes without complication she does have one new ulcer on the right anterior shin due to a small skin tear which is actually healed but then the adhesive from a Band-Aid that was put on it at a doctor's office actually has 20 skin and she says while he has a small ulcer due to that. Fortunately this is superficial. 01/30/18 on evaluation today patient appears to be doing rather well in regard to her left lower extremity ulcerations as well as her right lateral malleolus and gluteal region. She has been tolerating the dressing changes in general without  complication. She did see Dr. Trula Slade her vascular surgeon since I last saw her in the good news is he does not really feel like he's gonna have to perform anything surgical in regard to deputation. With that being said I am still somewhat concerned about her left fifth toe we will have to see. Nonetheless overall I do believe she's making progress and her right lateral malleolus appears to have at the allies to over at this point the question will be if this stays covered over or not. 02/13/18 on evaluation today patient continues to do rather well in regard to her ulcerations. Everything seems to be doing better her right lateral ankle is still open but just barely. Overall I have been happy with the progress. Patient also is happy she states even before I saw it and said anything she Artie knew it was better just from what she can feel. Electronic Signature(s) Signed: 02/13/2018 6:18:22 PM By: Worthy Keeler PA-C Entered By: Worthy Keeler on 02/13/2018 14:22:35 Bellows, [name] ([id number]) -------------------------------------------------------------------------------- Physical Exam Details Patient Name: ASPEN, LAWRANCE S. Date of Service: 02/13/2018 1:30 PM Medical  Record Number: [id number] Patient Account Number: [id number] Date of Birth/Sex: [birth year] (52 y.o. F) Treating RN: Montey Hora Primary Care Provider: Delight Stare Other Clinician: Referring Provider: Delight Stare Treating Provider/Extender: STONE III, Larico Dimock Weeks in Treatment: 8 Constitutional Well-nourished and well-hydrated in no acute distress. Respiratory normal breathing without difficulty. clear to auscultation bilaterally. Cardiovascular regular rate and rhythm with normal S1, S2. Psychiatric this patient is able to make decisions and demonstrates good insight into disease process. Alert and Oriented x 3. pleasant and cooperative. Notes Currently patient's wound in regard to her bottom does seem to be showing  signs of improvement and she was happier with the dressings and how we did this last time. Also believe this has been of benefit for her. Subsequently the only area of debridement that was necessary was for the foot ulcers were I did have to trim some of the necrotic eschar. She tolerated this with no pain whatsoever. Post like to debridement this did appear to be doing better however. Electronic Signature(s) Signed: 02/13/2018 6:18:22 PM By: Worthy Keeler PA-C Entered By: Worthy Keeler on 02/13/2018 14:23:34 Efaw, [name] ([id number]) -------------------------------------------------------------------------------- Physician Orders Details Patient Name: LEZETTE, KITTS. Date of Service: 02/13/2018 1:30 PM Medical Record Number: [id number] Patient Account Number: [id number] Date of Birth/Sex: 09/30/1966 (52 y.o. 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 Wound #3 Right,Lateral Malleolus o Clean wound with Normal Saline. Wound #5 Left,Circumferential Toe Great o Clean wound with Normal Saline. Wound #6 Left,Circumferential Toe Fourth o Clean wound with Normal Saline. Wound #7 Right Gluteus o Clean wound with  Normal Saline. Wound #8 Left,Lateral Foot o Clean wound with Normal Saline. Anesthetic (add to Medication List) Wound #7 Right Gluteus o Topical Lidocaine 4% cream applied to wound bed prior to debridement (In Clinic Only). Skin Barriers/Peri-Wound Care Wound #10 Left,Circumferential Toe Fifth o Skin Prep Wound #3 Right,Lateral Malleolus o Skin Prep Wound #5 Left,Circumferential Toe Great o Skin Prep Wound #6 Left,Circumferential Toe Fourth Howden, Shellie S. ([id number]) o Skin Prep Wound #7 Right Gluteus o Skin Prep Wound #8 Left,Lateral Foot o Skin Prep Primary Wound Dressing Wound #10 Left,Circumferential Toe Fifth o Other: - betadine or providone-iodine (paint on wound) - silver alginate between 4th and 5th toes on left foot Wound #3 Right,Lateral Malleolus o Non-adherent pad Wound #5 Left,Circumferential Toe Great o Other: - betadine or providone-iodine (paint on wound) - silver alginate between 4th and 5th toes on left foot Wound #6 Left,Circumferential Toe Fourth o Other: - betadine or providone-iodine (paint on wound) - silver alginate between 4th and 5th toes on left foot Wound #7 Right Gluteus o Drawtex Wound #8 Left,Lateral Foot o Other: - betadine or providone-iodine (paint on wound) - silver alginate between 4th and 5th toes on left foot Secondary Dressing Wound #10 Left,Circumferential Toe Fifth o ABD pad o Conform/Kerlix Wound #3 Right,Lateral Malleolus o  Boardered Foam Dressing Wound #5 Left,Circumferential Toe Great o ABD pad o Conform/Kerlix Wound #6 Left,Circumferential Toe Fourth o ABD pad o Conform/Kerlix Wound #7 Right Gluteus o Boardered Foam Dressing Wound #8 Left,Lateral Foot o ABD pad o Conform/Kerlix Dressing Change Frequency Wound #10 Left,Circumferential Toe Fifth o Change dressing every other day. o Other: - PRN Wound #3 Right,Lateral Malleolus Zakrzewski, Elnoria S. ([id number]) o  Change dressing every other day. o Other: - PRN Wound #5 Left,Circumferential Toe Great o Change dressing every other day. o Other: - PRN Wound #6 Left,Circumferential Toe Fourth o Change dressing every other day. o Other: - PRN Wound #7 Right Gluteus o Change dressing every other day. o Other: - PRN Wound #8 Left,Lateral Foot o Change dressing every other day. o Other: - PRN Follow-up Appointments Wound #10 Left,Circumferential Toe Fifth o Return Appointment in 1 week. Wound #3 Right,Lateral Malleolus o Return Appointment in 1 week. Wound #5 Left,Circumferential Toe Great o Return Appointment in 1 week. Wound #6 Left,Circumferential Toe Fourth o Return Appointment in 1 week. Wound #7 Right Gluteus o Return Appointment in 1 week. Wound #8 Left,Lateral Foot o Return Appointment in 1 week. Off-Loading Wound #7 Right Gluteus o Turn and reposition every 2 hours Additional Orders / Instructions Wound #10 Left,Circumferential Toe Fifth o Increase protein intake. Wound #3 Right,Lateral Malleolus o Increase protein intake. Wound #5 Left,Circumferential Toe [place] o Increase protein intake. Wound #6 Left,Circumferential Toe Fourth o Increase protein intake. Wound #7 Right Gluteus Squitieri, Felicha S. ([id number]) o Increase protein intake. Wound #8 Left,Lateral Foot o Increase protein intake. Home Health Wound #10 Left,Circumferential Toe [place] Nurse may visit PRN to address patientos wound care needs. o FACE TO FACE ENCOUNTER: MEDICARE and MEDICAID PATIENTS: I certify that this patient is under my care and that I had a face-to-face encounter that meets the physician face-to-face encounter requirements with this patient on this date. The encounter with the patient was in whole or in part for the following MEDICAL CONDITION: (primary reason for [place]) MEDICAL NECESSITY: I  certify, that based on my findings, NURSING services are a medically necessary home health service. HOME BOUND STATUS: I certify that my clinical findings support that this patient is homebound (i.e., Due to illness or injury, pt requires aid of supportive devices such as crutches, cane, wheelchairs, walkers, the use of special transportation or the assistance of another person to leave their place of residence. There is a normal inability to leave the home and doing so requires considerable and taxing effort. Other absences are for medical reasons / religious services and are infrequent or of short duration when for other reasons). o If current dressing causes regression in wound condition, may D/C ordered dressing product/s and apply Normal Saline Moist Dressing daily until next [place] / Other MD appointment. [place] of regression in wound condition at [phone]. o Please direct any NON-WOUND related issues/requests for orders to patient's Primary Care Physician Wound #3 [place] Nurse may visit PRN to address patientos wound care needs. o FACE TO FACE ENCOUNTER: MEDICARE and MEDICAID PATIENTS: I certify that this patient is under my care and that I had a face-to-face encounter that meets the physician face-to-face encounter requirements with this patient on this date. The encounter with the patient was in whole or in part for the following MEDICAL CONDITION: (primary reason for [place]) MEDICAL NECESSITY: I  certify, that based on my findings, NURSING services are a medically necessary home health service. HOME BOUND STATUS: I certify that my clinical findings support that this patient is homebound (i.e., Due to illness or injury, pt requires aid of supportive devices such as crutches, cane, wheelchairs, walkers, the use of special transportation or the assistance of another person  to leave their place of residence. There is a normal inability to leave the home and doing so requires considerable and taxing effort. Other absences are for medical reasons / religious services and are infrequent or of short duration when for other reasons). o If current dressing causes regression in wound condition, may D/C ordered dressing product/s and apply Normal Saline Moist Dressing daily until next [place] / Other MD appointment. [place] of regression in wound condition at [phone]. o Please direct any NON-WOUND related issues/requests for orders to patient's Primary Care Physician Wound #5 Left,Circumferential Toe [place] Nurse may visit PRN to address patientos wound care needs. o FACE TO FACE ENCOUNTER: MEDICARE and MEDICAID PATIENTS: I certify that this patient is under my care and that I had a face-to-face encounter that meets the physician face-to-face encounter requirements with this patient on this date. The encounter with the patient was in whole or in part for the following MEDICAL CONDITION: (primary reason for [place]) MEDICAL NECESSITY: I certify, that based on my findings, NURSING services are a medically necessary home health service. HOME BOUND STATUS: I certify that my clinical findings support that this patient is homebound (i.e., Due to illness or injury, pt requires aid of supportive devices such as crutches, cane, wheelchairs, walkers, the use of special transportation or the assistance of another person to leave their place of residence. There is a normal inability to leave the home and doing so requires considerable and taxing effort. Other absences are for medical reasons / religious services and are infrequent or of short duration when for other reasons). o If current dressing causes regression in wound condition, may D/C ordered dressing product/s and apply Normal  Saline Moist Dressing daily until next [place] / Other MD appointment. [place] of regression in wound condition at [phone]. TAUNYA, GORAL ([id number]) o Please direct any NON-WOUND related issues/requests for orders to patient's Primary Care Physician Wound #6 Left,Circumferential Toe [place] Nurse may visit PRN to address patientos wound care needs. o FACE TO FACE ENCOUNTER: MEDICARE and MEDICAID PATIENTS: I certify that this patient is under my care and that I had a face-to-face encounter that meets the physician face-to-face encounter requirements with this patient on this date. The encounter with the patient was in whole or in part for the following MEDICAL CONDITION: (primary reason for [place]) MEDICAL NECESSITY: I certify, that based on my findings, NURSING services are a medically necessary home health service. HOME BOUND STATUS: I certify that my clinical findings support that this patient is homebound (i.e., Due to illness or injury, pt requires aid of supportive devices such as crutches, cane, wheelchairs, walkers, the use of special transportation or the assistance of another person to leave their place of residence. There is a normal inability to leave the home and doing so requires considerable and taxing effort. Other absences are for medical reasons / religious services and are infrequent or of short duration when for other reasons). o If current dressing causes regression in wound  condition, may D/C ordered dressing product/s and apply Normal Saline Moist Dressing daily until next [place] / Other MD appointment. [place] of regression in wound condition at [phone]. o Please direct any NON-WOUND related issues/requests for orders to patient's Primary Care Physician Wound #7 Right [place] Nurse  may visit PRN to address patientos wound care needs. o FACE TO FACE ENCOUNTER: MEDICARE and MEDICAID PATIENTS: I certify that this patient is under my care and that I had a face-to-face encounter that meets the physician face-to-face encounter requirements with this patient on this date. The encounter with the patient was in whole or in part for the following MEDICAL CONDITION: (primary reason for [place]) MEDICAL NECESSITY: I certify, that based on my findings, NURSING services are a medically necessary home health service. HOME BOUND STATUS: I certify that my clinical findings support that this patient is homebound (i.e., Due to illness or injury, pt requires aid of supportive devices such as crutches, cane, wheelchairs, walkers, the use of special transportation or the assistance of another person to leave their place of residence. There is a normal inability to leave the home and doing so requires considerable and taxing effort. Other absences are for medical reasons / religious services and are infrequent or of short duration when for other reasons). o If current dressing causes regression in wound condition, may D/C ordered dressing product/s and apply Normal Saline Moist Dressing daily until next [place] / Other MD appointment. [place] of regression in wound condition at [phone]. o Please direct any NON-WOUND related issues/requests for orders to patient's Primary Care Physician Wound #8 [place] Visits o Home Health Nurse may visit PRN to address patientos wound care needs. o FACE TO FACE ENCOUNTER: MEDICARE and MEDICAID PATIENTS: I certify that this patient is under my care and that I had a face-to-face encounter that meets the physician face-to-face encounter requirements with this patient on this date. The encounter with the patient was in whole or in part for the following MEDICAL CONDITION:  (primary reason for [place]) MEDICAL NECESSITY: I certify, that based on my findings, NURSING services are a medically necessary home health service. HOME BOUND STATUS: I certify that my clinical findings support that this patient is homebound (i.e., Due to illness or injury, pt requires aid of supportive devices such as crutches, cane, wheelchairs, walkers, the use of special transportation or the assistance of another person to leave their place of residence. There is a normal inability to leave the home and doing so requires considerable and taxing effort. Other absences are for medical reasons / religious services and are infrequent or of short duration when for other reasons). o If current dressing causes regression in wound condition, may D/C ordered dressing product/s and apply Normal Saline Moist Dressing daily until next [place] / Other MD appointment. [place] of regression in wound condition at [phone]. o Please direct any NON-WOUND related issues/requests for orders to patient's Primary Care Physician Electronic Signature(s) CLARABELL, MATSUOKA ([id number]) Signed: 02/13/2018 4:53:17 PM By: Montey Hora Signed: 02/13/2018 6:18:22 PM By: Worthy Keeler PA-C Entered By: Montey Hora on 02/13/2018 14:21:41 Charland, [name] ([id number]) -------------------------------------------------------------------------------- Problem List Details Patient Name: LAKEESHA, FONTANILLA. Date of Service: 02/13/2018 1:30 PM Medical Record Number: [id number] Patient Account Number: [id number] Date of Birth/Sex: Jan 15, 1966 (52 y.o. 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 ([id number]) -------------------------------------------------------------------------------- Progress Note Details Patient Name: [name] Date of Service: 02/13/2018 1:30 PM Medical Record Number: [id number] Patient Account Number: [id number] Date of Birth/Sex: [birth year] (52 y.o. F) Treating RN: Montey Hora Primary Care Provider: Delight Stare Other Clinician: Referring Provider: Delight Stare Treating Provider/Extender: Melburn Hake, Irfan Veal Weeks in Treatment: 8 Subjective Chief Complaint Information obtained from Patient She is here in follow up evaluation for multiple wounds History of Present Illness (HPI) 52 year old patient was sent to [place] from [facility] where she was seen by Dr. Sherril Cong for a left ankle ulceration and was recently hospitalized with hypotension and sepsis. She was treated with IV antibiotics and has been scheduled to see Dr. Sharol Given. She was seen by vascular surgery who recommended a femoral bypass but surgery has been delayed until her sacral wound from last year's necrotizing fasciitis has healed. Was seeing the wound care team at [facility] but wanted to change over. She is a smoker and smokes a pack of cigarettes a day The patient was recently admitted in [place] between  February 2 and February 14. She had a follow-up to see vascular surgery, orthopedic surgery and infectious disease. 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 Review of the electronic medical records reveals that Dr. 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 An MRI showed edema in the bone which was consistent with osteomyelitis and the chronic ulcer and may need surgical intervention. She also had a sacral decubitus ulcer which was treated with the wound VAC. On 01/07/2017 she was taken up by the vascular surgeon Dr. Trula Slade for an abdominal aortogram and bilateral lower extremity runoff, for a history of having bilateral femoropopliteal bypass graft as well as external iliac stenting on the right and stenting of her bypass graft. She had developed a nonhealing wound on her right ankle and there was a possibility of a femoral occlusion. the findings were noted and the impression was a surgical revascularization with a aorto bifemoral bypass graft. Both the femoropopliteal bypass grafts were patent. 01/31/2017 -- x-ray of the right hip and pelvis -- IMPRESSION:No radiographic evidence of acute osteomyelitis. Normal-appearing right hip joint space for age. No acute bony abnormality of the hip. 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 Since seeing her last  week she has not had an appointment with infectious disease or the orthopedic specialist yet. 02/06/2017 -- the patient missed a couple of appointments  yesterday due to the weather but other than that has apparently given up smoking for the last 5 days. 02/13/2017 -- she has rescheduled her infectious disease appointment and also the appointment with the orthopedic surgeon Dr. Sharol Given 03/06/2017 -- she was seen by Dr. Lucianne Lei dam of infectious disease on 03/05/2017 and he recommendedIV antibiotics but the patient did not want to have that and he has given her Augmentin and doxycycline and will reevaluate her in 2 months time. 03/13/2017 -- he saw Dr. Trula Slade regarding her vascular issues and he would like to wait to the sacral wound is completely closed before considering aortobifemoral bypass graft. He will see her back in 2 months time. She was also seen by Dr. Sharol Given of orthopedics who recommended continue wound care dressings and follow in the office in about 2 months time. he also recommended 3 view radiographs of the right ankle at follow-up. 03/28/2017 -- she did have a PICC line placed and Dr. Lucianne Lei dam has begun IV vancomycin and ceftriaxone. she is going to continue this until she sees him in approximately a month's time 04/18/2017 -- we had applied for a skin substitute for the patient's care but her copayment is going to be about $300 a piece and the patient will not be able to afford this. 05/08/17 on evaluation today patient appears to potentially have a fungal rash in the periwound region of the sacral area. This Tardif, Saryiah S. ([id number]) also seems to extend into the inguinal creases and factional region as well. She is tender to palpation with light rubbing over the region of the periwound and the skin in this region does have a beefy red appearance. Everything seems to point to a fungal infection at this time. She has been tolerating the dressing changes up to this point. There is no evidence of infection otherwise. 05/16/2017 -- was seen by Dr. 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 He did not think that she is making progress and was not able to examine her sacral decubitus ulcer as the patient would not allow it. She was switched to Bactrim DS 2 tablets twice a day since finishing her antibiotics. Her ESR was 76 and a CRP was 2.8. 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 05/23/17 on evaluation today patient's wound appears to be doing about the same in regard to the ankle as well as the gluteal area. She apparently has issues with the one back staying in place and she states the sill seems to break up the inferior aspect. She does have home help coming out to apply the wound VAC. She has been tolerating the dressing changes without any problem although she has had some increased pain in the gluteal region due to a fall she sustained and she feels like she brews that area and she had pain actually radiating down her leg which is slowly beginning to improve. There's no evidence of infection. 05/29/17 Unfortunately on evaluation today patient's wounds both in regard to the ankle as well as in regard to her gluteal region on the right appear to be measuring the same as last week's evaluation. She also tells me that she is  having soreness today in regard to the right gluteal area because she had a visitor who came to see her a couple days ago and stayed for six hours and she had to set up for that entire length of time visiting. This has called some increased discomfort. Normally she does not set up for those long periods of time. Fortunately there's no evidence of infection and no worsening of the wounds. 06/13/2017 -- the patient has had a lot of health issues including perfuse diarrhea and generalized weakness and I have instructed her to see her PCP as soon as possible and if things get worse to go to the ER. She has not brought her  wound VAC today. She continues to be on oral antibiotics 06/30/17- she is here in follow up evaluation for right ischial and right lateral malleolus ulcer. She continues with negative pressure wound therapy to the ischial wound. She is currently on a diabetic therapy per infectious disease, although she does state that she inconsistently takes it. She was advised to take her antibiotic as prescribed 07/11/2017 -- she has been unable to use a wound VAC regularly because she loses this evening and it has not been possible for her to use it. 07/18/2017 -- she is much more comfortable and looks better without the wound VAC and at this stage I believe it will be better for her to return the machine and continue with local care. 08/07/2017 -- she is back up to 3 weeks due to a vacation and during this time her wound VAC was returned. She has been doing local dressings 08/15/17 on evaluation today patient appears to be doing about the same in regard to her ankle as well as right gluteal wound. She has been tolerating the dressing changes and tells me that it's mainly her gluteal wound that hurts the ankle is nontender to palpation. She rates this pain to be a one out of 10 at rest and with cleansing of the wound five out of 10 at least. No fevers, chills, nausea, or vomiting noted at this time. She does note that home health has told her to inquire about possibly using silver nitrate on the wound edges due to the road nature of the edges. 08/29/2017 -- -- I got a note which Dr. Lucianne Lei dam sent to Dr. 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 He had recommended definitive surgery to fix this. He recommended to continue with Bactrim Right ankle x-ray -- IMPRESSION: Findings compatible with cellulitis of the lateral malleolar region. A small focus of bony destruction involving the tip of the lateral malleolus is worrisome for  osteomyelitis. 09/05/2017 -- the patient was seen by Dr. Meridee Score yesterday who thought her wound looked good and he would continue local care with silver alginate dressing and follow-up in 4 weeks. He did not recommend any surgical intervention of the ankle until there is good arterial flow and he would await Dr. Stephens Shire opinion regarding this. She was also seen by infectious disease Dr. Lucianne Lei dam, who was concerned about her elevated potassium and took her off Bactrim. he has recommended doxycycline. 09/26/2017 -- she is back after 3 weeks as she was out of town on vacation. She has put on some weight but other than that Vadala, Simonne S. ([id number]) no change in her health 10/24/2017 -- she was seen by Dr. [name] -- after a thorough review he plans a angiographically via the left femoral  approach to evaluate her left subclavian artery and address any stenosis at that time. He will also perform bilateral runoff to confirm that a right femoropopliteal as well as a left femoropopliteal bypass graft remained patent. He is then considering a right axillary to femoral bypass graft for limb salvage. ==== 12/18/17-she is here in follow-up evaluation for multiple wounds. She is s/p aortobifemoral and left fem-pop revision. She presents with multiple necrotic areas to the left toes and lateral foot. The wounds we were originally seen her for (right heel and left buttock) are improved/stable. According to her husband the wounds to the left foot are "better" than they were upon discharge from the hospital last Tuesday. He states that Dr. Trula Slade saw her on same day of discharge. She has a follow-up with Dr. Trula Slade on Monday. There are dopplerable pulses to the left dorsalis pedis, posterior tibial, peroneal. We communicated with Dr. Stephens Shire nurse regarding today's findings, photos were sent to her email and the patient will maintain her appointment on Monday. We discussed with the patient and  her husband to report to [facility] in [place] for any concerns, changes, new areas of necrosis, new areas or progressive ischemia/purple discoloration, pallor, new/increased pain. We will anticipate follow up next week 12/30/17 patient unfortunately continues to have multiple wound of her lower extremities and specifically her left toes and lateral foot. She does have regular follow-up visits with Dr. Trula Slade who performed her surgery and at this point they are just holding to wait and see what needs to be done in regard to her foot such as what's going to heal and what may need to have potential amputation of further surgery. For this reason we are just trying to maintain and see were things end up at this point. Fortunately patient is not having any significant discomfort. The right lateral malleolus appears to be doing about the same there is some granulation tissue she does have osteomyelitis and not ulcer. 01/06/18 patient presents today for fault evaluation concerning her ongoing issues with her right lateral ankle, right gluteal region, and left lower extremity. Currently regard to left lower extremity it's possible that she may require some amputation although patient states she has the appointment follow-up with Dr. Trula Slade next week. That's when she will find out what he thinks needs to be done at that point. Nonetheless in regard to the right lateral ankle this appears to be doing fairly well today. Her right gluteal region actually appears to be doing fairly well with a good wound surface any slough that was present easily cleaned off with saline and gauze. 01/16/18 on evaluation today patient appears to be doing much better actually in regard to her left foot necrotic areas. A lot of this is starting to liftoff as far as the eschar is concerned and there is a lot of healing underneath which is excellent news. I'm actually pleasantly pleased with the results that she's getting in this  regard. Fortunately there does not appear to be any evidence of infection which is great news. Overall she has been tolerating the dressing changes without complication she does have one new ulcer on the right anterior shin due to a small skin tear which is actually healed but then the adhesive from a Band-Aid that was put on it at a doctor's office actually has 20 skin and she says while he has a small ulcer due to that. Fortunately this is superficial. 01/30/18 on evaluation today patient appears to be doing rather well  in regard to her left lower extremity ulcerations as well as her right lateral malleolus and gluteal region. She has been tolerating the dressing changes in general without complication. She did see Dr. Trula Slade her vascular surgeon since I last saw her in the good news is he does not really feel like he's gonna have to perform anything surgical in regard to deputation. With that being said I am still somewhat concerned about her left fifth toe we will have to see. Nonetheless overall I do believe she's making progress and her right lateral malleolus appears to have at the allies to over at this point the question will be if this stays covered over or not. 02/13/18 on evaluation today patient continues to do rather well in regard to her ulcerations. Everything seems to be doing better her right lateral ankle is still open but just barely. Overall I have been happy with the progress. Patient also is happy she states even before I saw it and said anything she Artie knew it was better just from what she can feel. Patient History Information obtained from Patient. Family History Diabetes - Mother,Father,Maternal Grandparents,Paternal Grandparents,Child, Heart Disease - [place] Grandparents, Hereditary Spherocytosis - Mother, Hypertension - Mother,Siblings,Paternal Grandparents,Maternal Grandparents, Seizures - Maternal Grandparents,Paternal  Grandparents, Thyroid Problems - Siblings, No family history of Cancer, Kidney Disease, Lung Disease, Stroke, Tuberculosis. LAFONDA, PATRON ([id number]) Social History Current every day smoker, Marital Status - Married, Alcohol Use - Rarely, Drug Use - No History, Caffeine Use - Moderate. Review of Systems (ROS) Constitutional Symptoms (General Health) Denies complaints or symptoms of Fever, Chills. Respiratory The patient has no complaints or symptoms. Cardiovascular The patient has no complaints or symptoms. Psychiatric The patient has no complaints or symptoms. Objective Constitutional Well-nourished and well-hydrated in no acute distress. Vitals Time Taken: 1:43 PM, Height: 67 in, Temperature: 98.0 F, Pulse: 78 bpm, Respiratory Rate: 18 breaths/min, Blood Pressure: 82/58 mmHg. General Notes: BP 88/58 patient states she feels fine. PA Notified. Respiratory normal breathing without difficulty. clear to auscultation bilaterally. Cardiovascular regular rate and rhythm with normal S1, S2. Psychiatric this patient is able to make decisions and demonstrates good insight into disease process. Alert and Oriented x 3. pleasant and cooperative. General Notes: Currently patient's wound in regard to her bottom does seem to be showing signs of improvement and she was happier with the dressings and how we did this last time. Also believe this has been of benefit for her. Subsequently the only area of debridement that was necessary was for the foot ulcers were I did have to trim some of the necrotic eschar. She tolerated this with no pain whatsoever. Post like to debridement this did appear to be doing better however. Integumentary (Hair, Skin) Wound #10 status is Open. Original cause of wound was Gradually Appeared. The wound is located on the Left,Circumferential Toe Fifth. The wound measures 2cm length x 1.2cm width x 0.1cm depth; 1.885cm^2 area and 0.188cm^3 volume. Wound #11 status is  Healed - Epithelialized. Original cause of wound was Trauma. The wound is located on the Right,Medial Lower Leg. The wound measures 0cm length x 0cm width x 0cm depth; 0cm^2 area and 0cm^3 volume. Wound #3 status is Open. Original cause of wound was Pressure Injury. The wound is located on the Right,Lateral Malleolus. The wound measures 0.3cm length x 0.3cm width x 0.2cm depth; 0.071cm^2 area and 0.014cm^3 volume. There is Fat Layer (Subcutaneous Tissue) Exposed exposed. There is no tunneling or undermining noted. There is a large  amount of serous Crady, Karris S. ([id number]) drainage noted. The wound margin is distinct with the outline attached to the wound base. There is medium (34-66%) pink granulation within the wound bed. There is a medium (34-66%) amount of necrotic tissue within the wound bed including Eschar and Adherent Slough. The periwound skin appearance did not exhibit: Callus, Crepitus, Excoriation, Induration, Rash, Scarring, Dry/Scaly, Maceration, Atrophie Blanche, Cyanosis, Ecchymosis, Hemosiderin Staining, Mottled, Pallor, Rubor, Erythema. Periwound temperature was noted as No Abnormality. Wound #4 status is Open. Original cause of wound was Blister. The wound is located on the Left,Lateral Calcaneus. The wound measures 0cm length x 0cm width x 0cm depth; 0cm^2 area and 0cm^3 volume. There is Fat Layer (Subcutaneous Tissue) Exposed exposed. There is no tunneling or undermining noted. There is a medium amount of serosanguineous drainage noted. The wound margin is distinct with the outline attached to the wound base. There is no granulation within the wound bed. There is no necrotic tissue within the wound bed. The periwound skin appearance exhibited: Dry/Scaly. The periwound skin appearance did not exhibit: Callus, Crepitus, Excoriation, Induration, Rash, Scarring, Maceration, Atrophie Blanche, Cyanosis, Ecchymosis, Hemosiderin Staining, Mottled, Pallor, Rubor, Erythema. Periwound  temperature was noted as No Abnormality. Wound #5 status is Open. Original cause of wound was Gradually Appeared. The wound is located on the Left,Circumferential Toe Great. The wound measures 2.4cm length x 2cm width x 0.1cm depth; 3.77cm^2 area and 0.377cm^3 volume. There is a none present amount of drainage noted. The wound margin is distinct with the outline attached to the wound base. There is no granulation within the wound bed. There is a large (67-100%) amount of necrotic tissue within the wound bed including Eschar. The periwound skin appearance did not exhibit: Callus, Crepitus, Excoriation, Induration, Rash, Scarring, Dry/Scaly, Maceration, Atrophie Blanche, Cyanosis, Ecchymosis, Hemosiderin Staining, Mottled, Pallor, Rubor, Erythema. Periwound temperature was noted as No Abnormality. Wound #6 status is Open. Original cause of wound was Gradually Appeared. The wound is located on the Left,Circumferential Toe Fourth. The wound measures 2cm length x 2.2cm width x 0.1cm depth; 3.456cm^2 area and 0.346cm^3 volume. Wound #7 status is Open. Original cause of wound was Gradually Appeared. The wound is located on the Right Gluteus. The wound measures 0.8cm length x 0.4cm width x 0.5cm depth; 0.251cm^2 area and 0.126cm^3 volume. There is Fat Layer (Subcutaneous Tissue) Exposed exposed. There is no tunneling or undermining noted. There is a large amount of serosanguineous drainage noted. The wound margin is distinct with the outline attached to the wound base. There is large (67-100%) red granulation within the wound bed. There is a small (1-33%) amount of necrotic tissue within the wound bed including Adherent Slough. The periwound skin appearance exhibited: Excoriation. The periwound skin appearance did not exhibit: Callus, Crepitus, Induration, Rash, Scarring, Dry/Scaly, Maceration, Atrophie Blanche, Cyanosis, Ecchymosis, Hemosiderin Staining, Mottled, Pallor, Rubor, Erythema. Periwound  temperature was noted as No Abnormality. The periwound has tenderness on palpation. Wound #8 status is Open. Original cause of wound was Gradually Appeared. The wound is located on the Left,Lateral Foot. The wound measures 2cm length x 3cm width x 0.1cm depth; 4.712cm^2 area and 0.471cm^3 volume. 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 There was a Selective/Open Wound Non-Viable Tissue Debridement with a total area of 0.36 sq cm performed by STONE III, Cecil Vandyke E., PA-C. With the following instrument(s): Forceps, and Scissors. to remove Non-Viable tissue/material Material removed includes Eschar after achieving pain control using Lidocaine 4% Topical Solution. No specimens were taken. A time out was conducted at 14:15, prior to the start of the procedure. There was no bleeding. The procedure was tolerated well with a pain level of 0 throughout and a pain level of 0 following the procedure. Post Debridement Measurements: 2cm length x 1.2cm width x 0.1cm depth; 0.188cm^3 volume. Character of Wound/Ulcer Post Debridement is improved. Severity of Tissue Post Debridement is: Fat layer exposed. Post procedure Diagnosis Wound #10: Same as Pre-Procedure Wound #5 Pre-procedure diagnosis of Wound #5 is a  Diabetic Wound/Ulcer of the Lower Extremity located on the Left,Circumferential Toe Great .Severity of Tissue Pre Debridement is: Fat layer exposed. There was a Selective/Open Wound Non-Viable Tissue Debridement with a total area of 1 sq cm performed by STONE III, Thom Ollinger E., PA-C. With the following instrument(s): Forceps, and Scissors. to remove Non-Viable tissue/material Material removed includes Eschar after achieving pain control using Lidocaine 4% Topical Solution. No specimens were taken. A time out was conducted at 14:16, prior to the start of the procedure. There was no bleeding. The procedure was tolerated well with a pain level of 0 throughout and a pain level of 0 following the procedure. Post Debridement Measurements: 2.4cm length x 2cm width x 0.1cm depth; 0.377cm^3 volume. Character of Wound/Ulcer Post Debridement is improved. Severity of Tissue Post Debridement is: Fat layer exposed. Post procedure Diagnosis Wound #5: Same as Pre-Procedure Plan Wound Cleansing: Wound #10 Left,Circumferential Toe Fifth: Clean wound with Normal Saline. Wound #3 Right,Lateral Malleolus: Clean wound with Normal Saline. Wound #5 Left,Circumferential Toe Great: Clean wound with Normal Saline. Wound #6 Left,Circumferential Toe Fourth: Clean wound with Normal Saline. Wound #7 Right Gluteus: Clean wound with Normal Saline. Wound #8 Left,Lateral Foot: Clean wound with Normal Saline. Anesthetic (add to Medication List): Wound #7 Right Gluteus: Topical Lidocaine 4% cream applied to wound bed prior to debridement (In Clinic Only). Skin Barriers/Peri-Wound Care: Wound #10 Left,Circumferential Toe Fifth: Skin Prep Wound #3 Right,Lateral Malleolus: Skin Prep Wound #5 Left,Circumferential Toe Great: Skin Prep Wound #6 Left,Circumferential Toe Fourth: Skin Prep Tippett, Aasiyah S. ([id number]) Wound #7 Right Gluteus: Skin Prep Wound #8 Left,Lateral Foot: Skin Prep Primary Wound Dressing: Wound #10  Left,Circumferential Toe Fifth: Other: - betadine or providone-iodine (paint on wound) - silver alginate between 4th and 5th toes on left foot Wound #3 Right,Lateral Malleolus: Non-adherent pad Wound #5 Left,Circumferential Toe Great: Other: - betadine or providone-iodine (paint on wound) - silver alginate between 4th and 5th toes on left foot Wound #6 Left,Circumferential Toe Fourth: Other: - betadine or providone-iodine (paint on wound) - silver alginate between 4th and 5th toes on left foot Wound #7 Right Gluteus: Drawtex Wound #8 Left,Lateral Foot: Other: - betadine or providone-iodine (paint on wound) - silver alginate between 4th and 5th toes on left foot Secondary Dressing: Wound #10 Left,Circumferential Toe Fifth: ABD pad Conform/Kerlix Wound #3 Right,Lateral Malleolus: Boardered Foam Dressing Wound #5 Left,Circumferential Toe Great: ABD pad Conform/Kerlix Wound #  6 Left,Circumferential Toe Fourth: ABD pad Conform/Kerlix Wound #7 Right Gluteus: Boardered Foam Dressing Wound #8 Left,Lateral Foot: ABD pad Conform/Kerlix Dressing Change Frequency: Wound #10 Left,Circumferential Toe Fifth: Change dressing every other day. Other: - PRN Wound #3 Right,Lateral Malleolus: Change dressing every other day. Other: - PRN Wound #5 Left,Circumferential Toe Great: Change dressing every other day. Other: - PRN Wound #6 Left,Circumferential Toe Fourth: Change dressing every other day. Other: - PRN Wound #7 Right Gluteus: Change dressing every other day. Other: - PRN Wound #8 Left,Lateral Foot: Change dressing every other day. Other: - PRN Follow-up Appointments: Wound #10 Left,Circumferential Toe Fifth: Return Appointment in 1 week. Wound #3 Right,Lateral Malleolus: Return Appointment in 1 week. Wound #5 Left,Circumferential Toe Great: Return Appointment in 1 week. WYOMA, GENSON ([id number]) Wound #6 Left,Circumferential Toe Fourth: Return Appointment in 1  week. Wound #7 Right Gluteus: Return Appointment in 1 week. Wound #8 Left,Lateral Foot: Return Appointment in 1 week. Off-Loading: Wound #7 Right Gluteus: Turn and reposition every 2 hours Additional Orders / Instructions: Wound #10 Left,Circumferential Toe Fifth: Increase protein intake. Wound #3 Right,Lateral Malleolus: Increase protein intake. Wound #5 Left,Circumferential Toe Great: Increase protein intake. Wound #6 Left,Circumferential Toe Fourth: Increase protein intake. Wound #7 Right Gluteus: Increase protein intake. Wound #8 Left,Lateral Foot: Increase protein intake. Home Health: Wound #10 Left,Circumferential Toe Fifth: [place] Nurse may visit PRN to address patient s wound care needs. FACE TO FACE ENCOUNTER: MEDICARE and MEDICAID PATIENTS: I certify that this patient is under my care and that I had a face-to-face encounter that meets the physician face-to-face encounter requirements with this patient on this date. The encounter with the patient was in whole or in part for the following MEDICAL CONDITION: (primary reason for [place]) MEDICAL NECESSITY: I certify, that based on my findings, NURSING services are a medically necessary home health service. HOME BOUND STATUS: I certify that my clinical findings support that this patient is homebound (i.e., Due to illness or injury, pt requires aid of supportive devices such as crutches, cane, wheelchairs, walkers, the use of special transportation or the assistance of another person to leave their place of residence. There is a normal inability to leave the home and doing so requires considerable and taxing effort. Other absences are for medical reasons / religious services and are infrequent or of short duration when for other reasons). If current dressing causes regression in wound condition, may D/C ordered dressing product/s and apply Normal Saline Moist Dressing daily until next  [place] / Other MD appointment. [place] of regression in wound condition at [phone]. Please direct any NON-WOUND related issues/requests for orders to patient's Primary Care Physician Wound #3 Right,Lateral Malleolus: [place] Nurse may visit PRN to address patient s wound care needs. FACE TO FACE ENCOUNTER: MEDICARE and MEDICAID PATIENTS: I certify that this patient is under my care and that I had a face-to-face encounter that meets the physician face-to-face encounter requirements with this patient on this date. The encounter with the patient was in whole or in part for the following MEDICAL CONDITION: (primary reason for [place]) MEDICAL NECESSITY: I certify, that based on my findings, NURSING services are a medically necessary home health service. HOME BOUND STATUS: I certify that my clinical findings support that this patient is homebound (i.e., Due to illness or injury, pt requires aid of supportive devices such as crutches, cane, wheelchairs, walkers, the use of special transportation or  the assistance of another person to leave their place of residence. There is a normal inability to leave the home and doing so requires considerable and taxing effort. Other absences are for medical reasons / religious services and are infrequent or of short duration when for other reasons). If current dressing causes regression in wound condition, may D/C ordered dressing product/s and apply Normal Saline Moist Dressing daily until next [place] / Other MD appointment. [place] of regression in wound condition at [phone]. Please direct any NON-WOUND related issues/requests for orders to patient's Primary Care Physician Wound #5 Left,Circumferential Toe Great: [place] Nurse may visit PRN to address patient s wound care needs. FACE TO FACE ENCOUNTER: MEDICARE and  MEDICAID PATIENTS: I certify that this patient is under my care and that I had a face-to-face encounter that meets the physician face-to-face encounter requirements with this patient on this date. The KALIMAH, CAPURRO ([id number]) encounter with the patient was in whole or in part for the following MEDICAL CONDITION: (primary reason for Home Healthcare) MEDICAL NECESSITY: I certify, that based on my findings, NURSING services are a medically necessary home health service. HOME BOUND STATUS: I certify that my clinical findings support that this patient is homebound (i.e., Due to illness or injury, pt requires aid of supportive devices such as crutches, cane, wheelchairs, walkers, the use of special transportation or the assistance of another person to leave their place of residence. There is a normal inability to leave the home and doing so requires considerable and taxing effort. Other absences are for medical reasons / religious services and are infrequent or of short duration when for other reasons). If current dressing causes regression in wound condition, may D/C ordered dressing product/s and apply Normal Saline Moist Dressing daily until next [place] / Other MD appointment. [place] of regression in wound condition at [phone]. Please direct any NON-WOUND related issues/requests for orders to patient's Primary Care Physician Wound #6 Left,Circumferential Toe Fourth: [place] Nurse may visit PRN to address patient s wound care needs. FACE TO FACE ENCOUNTER: MEDICARE and MEDICAID PATIENTS: I certify that this patient is under my care and that I had a face-to-face encounter that meets the physician face-to-face encounter requirements with this patient on this date. The encounter with the patient was in whole or in part for the following MEDICAL CONDITION: (primary reason for [place]) MEDICAL NECESSITY: I certify, that  based on my findings, NURSING services are a medically necessary home health service. HOME BOUND STATUS: I certify that my clinical findings support that this patient is homebound (i.e., Due to illness or injury, pt requires aid of supportive devices such as crutches, cane, wheelchairs, walkers, the use of special transportation or the assistance of another person to leave their place of residence. There is a normal inability to leave the home and doing so requires considerable and taxing effort. Other absences are for medical reasons / religious services and are infrequent or of short duration when for other reasons). If current dressing causes regression in wound condition, may D/C ordered dressing product/s and apply Normal Saline Moist Dressing daily until next [place] / Other MD appointment. [place] of regression in wound condition at [phone]. Please direct any NON-WOUND related issues/requests for orders to patient's Primary Care Physician Wound #7 Right Gluteus: [place] Nurse may visit PRN to address patient  s wound care needs. FACE TO FACE ENCOUNTER: MEDICARE and MEDICAID PATIENTS: I certify that this patient is under my care and that I had a face-to-face encounter that meets the physician face-to-face encounter requirements with this patient on this date. The encounter with the patient was in whole or in part for the following MEDICAL CONDITION: (primary reason for [place]) MEDICAL NECESSITY: I certify, that based on my findings, NURSING services are a medically necessary home health service. HOME BOUND STATUS: I certify that my clinical findings support that this patient is homebound (i.e., Due to illness or injury, pt requires aid of supportive devices such as crutches, cane, wheelchairs, walkers, the use of special transportation or the assistance of another person to leave their place of residence. There is  a normal inability to leave the home and doing so requires considerable and taxing effort. Other absences are for medical reasons / religious services and are infrequent or of short duration when for other reasons). If current dressing causes regression in wound condition, may D/C ordered dressing product/s and apply Normal Saline Moist Dressing daily until next [place] / Other MD appointment. [place] of regression in wound condition at [phone]. Please direct any NON-WOUND related issues/requests for orders to patient's Primary Care Physician Wound #8 Left,Lateral Foot: [place] Nurse may visit PRN to address patient s wound care needs. FACE TO FACE ENCOUNTER: MEDICARE and MEDICAID PATIENTS: I certify that this patient is under my care and that I had a face-to-face encounter that meets the physician face-to-face encounter requirements with this patient on this date. The encounter with the patient was in whole or in part for the following MEDICAL CONDITION: (primary reason for [place]) MEDICAL NECESSITY: I certify, that based on my findings, NURSING services are a medically necessary home health service. HOME BOUND STATUS: I certify that my clinical findings support that this patient is homebound (i.e., Due to illness or injury, pt requires aid of supportive devices such as crutches, cane, wheelchairs, walkers, the use of special transportation or the assistance of another person to leave their place of residence. There is a normal inability to leave the home and doing so requires considerable and taxing effort. Other absences are for medical reasons / religious services and are infrequent or of short duration when for other reasons). If current dressing causes regression in wound condition, may D/C ordered dressing product/s and apply Normal Saline Moist Dressing daily until next [place] / Other MD  appointment. [place] of regression in wound condition at [phone]. Please direct any NON-WOUND related issues/requests for orders to patient's Primary Care Physician AIMY, SWEETING ([id number]) I am going at this point in time to recommend that we continue with the Current wound care measures for the next week. Patient is in agreement the plan. We will reinitiate ape dressing of Prisma for the right lateral ankle to try to help this completely close I thought it might've been close last week but it is still just slightly open. Please see above for specific wound care orders. We will see patient for re-evaluation in 2 week(s) here in the clinic. If anything worsens or changes patient will contact our office for additional recommendations. Electronic Signature(s) Signed: 02/13/2018 6:18:22 PM By: Worthy Keeler PA-C Entered By: Worthy Keeler on 02/13/2018 14:24:14 [place], [name] ([id number]) -------------------------------------------------------------------------------- ROS/PFSH Details Patient Name: [name] Date of Service: 02/13/2018 1:30 PM Medical Record Number:  [id number] Patient Account Number: [id number] Date of Birth/Sex: Jan 03, 1966 (52 y.o. F) Treating RN: Montey Hora Primary Care Provider: Delight Stare Other Clinician: Referring Provider: Delight Stare Treating Provider/Extender: STONE III, Jaianna Nicoll Weeks in Treatment: 8 Information Obtained From Patient Wound History Do you currently have one or more open woundso Yes How many open wounds do you currently haveo 4 Approximately how long have you had your woundso 1 month and other 7 yrs How have you been treating your wound(s) until nowo gauze Has your wound(s) ever healed and then re-openedo No Have you had any lab work done in the past montho No Have you tested positive for an antibiotic resistant organism (MRSA, VRE)o No Have you tested positive for osteomyelitis (bone infection)o Yes Have  you had any tests for circulation on your legso Yes Have you had other problems associated with your woundso Swelling Constitutional Symptoms (General Health) Complaints and Symptoms: Negative for: Fever; Chills Eyes Medical History: Negative for: Cataracts; Glaucoma; Optic Neuritis Ear/Nose/Mouth/Throat Medical History: Negative for: Chronic sinus problems/congestion; Middle ear problems Hematologic/Lymphatic Medical History: Positive for: Anemia Negative for: Hemophilia; Human Immunodeficiency Virus; Lymphedema; Sickle Cell Disease Respiratory Complaints and Symptoms: No Complaints or Symptoms Medical History: Negative for: Aspiration; Asthma; Chronic Obstructive Pulmonary Disease (COPD); Pneumothorax; Sleep Apnea; Tuberculosis Cardiovascular Complaints and Symptoms: No Complaints or Symptoms Medical History: SITA, MANGEN. ([id number]) Positive for: Arrhythmia - a-fib; Coronary Artery Disease; Hypotension; Peripheral Arterial Disease Gastrointestinal Medical History: Negative for: Cirrhosis ; Colitis; Crohnos; Hepatitis A; Hepatitis B; Hepatitis C Endocrine Medical History: Positive for: Type II Diabetes Treated with: Insulin, Oral agents Blood sugar tested every day: Yes Tested : 4 times a day Blood sugar testing results: Bedtime: 190 Genitourinary Medical History: Negative for: End Stage Renal Disease Immunological Medical History: Negative for: Lupus Erythematosus; Raynaudos; Scleroderma Integumentary (Skin) Medical History: Negative for: History of Burn; History of pressure wounds Musculoskeletal Medical History: Positive for: Osteoarthritis Neurologic Medical History: Negative for: Dementia; Neuropathy; Quadriplegia; Paraplegia; Seizure Disorder Oncologic Medical History: Negative for: Received Chemotherapy; Received Radiation Psychiatric Complaints and Symptoms: No Complaints or Symptoms Medical History: Negative for: Anorexia/bulimia; Confinement  Anxiety Immunizations Pneumococcal Vaccine: Received Pneumococcal Vaccination: GWENETTA, DEVOS ([id number]) Implantable Devices Family and Social History Cancer: No; Diabetes: Yes - Mother,Father,Maternal Grandparents,Paternal Grandparents,Child; Heart Disease: Yes - Father,Mother,Maternal Grandparents,Paternal Grandparents; Hereditary Spherocytosis: Yes - Mother; Hypertension: Yes - Mother,Siblings,Paternal Grandparents,Maternal Grandparents; Kidney Disease: No; Lung Disease: No; Seizures: Yes - Maternal Grandparents,Paternal Grandparents; Stroke: No; Thyroid Problems: Yes - Siblings; Tuberculosis: No; Current every day smoker; Marital Status - Married; Alcohol Use: Rarely; Drug Use: No History; Caffeine Use: Moderate; Financial Concerns: No; Food, Clothing or Shelter Needs: No; Support System Lacking: No; Transportation Concerns: No; Advanced Directives: No; Patient does not want information on Advanced Directives; Do not resuscitate: No; Living Will: No; Medical Power of Attorney: No Physician Affirmation I have reviewed and agree with the above information. Electronic Signature(s) Signed: 02/13/2018 4:53:17 PM By: Montey Hora Signed: 02/13/2018 6:18:22 PM By: Worthy Keeler PA-C Entered By: Worthy Keeler on 02/13/2018 14:22:59 Mallek, Devonna Chauncey Cruel ([id number]) -------------------------------------------------------------------------------- SuperBill Details Patient Name: AUSTYN, PERRIELLO. Date of Service: 02/13/2018 Medical Record Number: [id number] Patient Account Number: [id number] Date of Birth/Sex: 11-25-1965 (52 y.o. 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 S31.819S Unspecified open wound of right buttock, sequela L97.312 Non-pressure chronic ulcer of right ankle with fat layer exposed Facility Procedures CPT4 Code Description: [id number] - DEBRIDE WOUND 1ST 20 SQ CM OR < ICD-10 Diagnosis Description L97.528 Non-pressure chronic ulcer of other part of left foot with other s Modifier: pecified severit Quantity: 1 y Physician Procedures CPT4: Description Modifier Quantity Code [id number] - WC PHYS LEVEL 3 - EST PT 25 1 ICD-10 Diagnosis Description F20.761N Unspecified open wound of right buttock, sequela I70.393 Other atherosclerosis of unspecified type of bypass graft(s) of the  extremities, bilateral legs 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

## 2018-02-20 NOTE — Progress Notes (Signed)
[name], [name] ([id number]) Visit Report for 02/13/2018 Arrival Information Details Patient Name: [name], [name]. Date of Service: 02/13/2018 1:30 PM Medical Record Number: [id number] Patient Account Number: [id number] Date of Birth/Sex: May 17, 1966 (52 y.o. F) Treating [name]: Roger Shelter Primary Care Areya Lemmerman: Delight Stare Other Clinician: Referring Alisyn Lequire: Delight Stare Treating Ahnesty Finfrock/Extender: Melburn Hake, HOYT Weeks in Treatment: 8 Visit Information History Since Last Visit All ordered tests and consults were completed: No Patient Arrived: Walker Added or deleted any medications: No Arrival Time: 13:42 Any new allergies or adverse reactions: No Accompanied By: self Had a fall or experienced change in No Transfer Assistance: None activities of daily living that may affect Patient Identification Verified: Yes risk of falls: Secondary Verification Process Yes Signs or symptoms of abuse/neglect since last visito No Completed: Hospitalized since last visit: No Patient Requires Transmission-Based No Implantable device outside of the clinic excluding No Precautions: cellular tissue based products placed in the center Patient Has Alerts: Yes since last visit: Patient Alerts: Patient on Blood Pain Present Now: Yes Thinner DM II Xarelto Electronic Signature(s) Signed: 02/13/2018 4:26:13 PM By: Roger Shelter Entered By: Roger Shelter on 02/13/2018 13:42:41 Guard, [name] ([id number]) -------------------------------------------------------------------------------- Encounter Discharge Information Details Patient Name: [name]. Date of Service: 02/13/2018 1:30 PM Medical Record Number: [id number] Patient Account Number: [id number] Date of Birth/Sex: 08-10-1966 (52 y.o. F) Treating [name]: Ahmed Prima Primary Care Marshella Tello: Delight Stare Other Clinician: Referring Jezreel Justiniano: Delight Stare Treating Naquan Garman/Extender: Melburn Hake, HOYT Weeks in Treatment: 8 Encounter  Discharge Information Items Discharge Pain Level: 0 Discharge Condition: Stable Ambulatory Status: Walker Discharge Destination: Home Private Transportation: Auto Accompanied By: husband Schedule Follow-up Appointment: Yes Medication Reconciliation completed and provided No to Patient/Care Analayah Brooke: Clinical Summary of Care: Electronic Signature(s) Signed: 02/13/2018 2:34:44 PM By: Alric Quan Entered By: Alric Quan on 02/13/2018 14:34:44 [name], [name] ([id number]) -------------------------------------------------------------------------------- Lower Extremity Assessment Details Patient Name: [name]. Date of Service: 02/13/2018 1:30 PM Medical Record Number: [id number] Patient Account Number: [id number] Date of Birth/Sex: Apr 06, 1966 (51 y.o. F) Treating [name]: Roger Shelter Primary Care Leda Bellefeuille: Delight Stare Other Clinician: Referring Noe Goyer: Delight Stare Treating Anvith Mauriello/Extender: STONE III, HOYT Weeks in Treatment: 8 Edema Assessment Assessed: [Left: No] [Right: No] Edema: [Left: No] [Right: No] Vascular Assessment Pulses: Dorsalis Pedis Palpable: [Left:Yes] [Right:Yes] Posterior Tibial Extremity colors, hair growth, and conditions: Extremity Color: [Left:Red] [Right:Normal] Hair Growth on Extremity: [Left:No] [Right:No] Temperature of Extremity: [Left:Warm] [Right:Warm] Capillary Refill: [Left:< 3 seconds] [Right:< 3 seconds] Toe Nail Assessment Left: Right: Thick: Yes Yes Discolored: Yes Yes Deformed: Yes Yes Improper Length and Hygiene: Yes Yes Electronic Signature(s) Signed: 02/13/2018 4:26:13 PM By: Roger Shelter Entered By: Roger Shelter on 02/13/2018 14:01:39 [name], [name] ([id number]) -------------------------------------------------------------------------------- Multi Wound Chart Details Patient Name: [name]. Date of Service: 02/13/2018 1:30 PM Medical Record Number: [id number] Patient Account Number:  [id number] Date of Birth/Sex: June 21, 1966 (52 y.o. F) Treating [name]: Montey Hora Primary Care Sergio Zawislak: Delight Stare Other Clinician: Referring Nyara Capell: Delight Stare Treating Cassundra Mckeever/Extender: STONE III, HOYT Weeks in Treatment: 8 Vital Signs Height(in): 67 Pulse(bpm): 78 Weight(lbs): Blood Pressure(mmHg): 82/58 Body Mass Index(BMI): Temperature(F): 98.0 Respiratory Rate 18 (breaths/min): Photos: [10:No Photos] [11:No Photos] [3:No Photos] Wound Location: [10:Left, Circumferential Toe Fifth] [11:Right, Medial Lower Leg] [3:Right Malleolus - Lateral] Wounding Event: [10:Gradually Appeared] [11:Trauma] [3:Pressure Injury] 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014] % Reduction in Area: [10:80.00%] [11:100.00%] [3:91.60%] % Reduction in Volume: [10:80.00%] [11:100.00%] [3:91.80%] Classification: [10:Grade 1] [11:Full Thickness Without Exposed Support Structures] [3:Grade 2] Exudate Amount: [10:N/A] [11:N/A] [3:Large] Exudate Type: [10:N/A] [11:N/A] [3:Serous] Exudate Color: [10:N/A] [11:N/A] [3:amber] Wound Margin: [10:N/A] [11:N/A] [3:Distinct, outline attached] Granulation Amount: [10:N/A] [11:N/A] [3:Medium (34-66%)] Granulation Quality: [10:N/A] [11:N/A] [3:Pink] Necrotic Amount: [10:N/A] [11:N/A] [3:Medium (34-66%)] Necrotic Tissue: [10:N/A] [11:N/A]  [3:Eschar, Adherent Slough] Epithelialization: [10:N/A] [11:N/A] [3:Medium (34-66%)] Periwound Skin Texture: [10:No Abnormalities Noted] [11:No Abnormalities Noted] [3:Excoriation: No Induration: No Callus: No Crepitus: No] Rash: No Scarring: No Periwound Skin Moisture: No Abnormalities Noted No Abnormalities Noted Maceration: No Dry/Scaly: No Periwound Skin Color: No Abnormalities Noted No Abnormalities Noted Atrophie Blanche: No Cyanosis: No Ecchymosis: No Erythema: No Hemosiderin Staining: No Mottled: No Pallor: No Rubor: No Temperature: N/A N/A No Abnormality Tenderness on Palpation: No No No Wound Preparation: N/A N/A Ulcer Cleansing: Rinsed/Irrigated with Saline Topical Anesthetic Applied: None Wound Number: [id number]  Photos: No Photos No Photos No Photos Wound Location: Left Calcaneus - Lateral Left Toe Great - Left, Circumferential Toe Circumfernential Fourth Wounding Event: Blister Gradually Appeared Gradually Appeared 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 Lower Extremity Lower Extremity Comorbid History: Anemia, Arrhythmia, Coronary Anemia, Arrhythmia, Coronary N/A Artery Disease, Hypotension, Artery Disease, Hypotension, Peripheral Arterial Disease, Peripheral Arterial Disease, Type II Diabetes, Type II Diabetes, Osteoarthritis Osteoarthritis Date Acquired: 11/27/2017 11/27/2017 11/27/2017 Weeks of Treatment: 8 8 8  Wound Status: Open Open Open Pending Amputation on Yes Yes Yes Presentation: Measurements L x W x D 0x0x0 2.4x2x0.1 2x2.2x0.1 (cm) Area (cm) : 0 3.77 3.456 Volume (cm) : 0 0.377 0.346 % Reduction in Area: 100.00% 87.50% 26.70% % Reduction in Volume: 100.00% 87.50% 26.50% Classification: Grade 2 Grade 2 Grade 2 Exudate Amount: Medium None Present N/A Exudate Type: Serosanguineous N/A N/A Exudate Color: red, brown N/A N/A Wound Margin: Distinct, outline attached  Distinct, outline attached N/A Granulation Amount: None Present (0%) None Present (0%) N/A Granulation Quality: N/A N/A N/A Necrotic Amount: None Present (0%) Large (67-100%) N/A Necrotic Tissue: N/A Eschar N/A Exposed Structures: Fat Layer (Subcutaneous N/A N/A Tissue) Exposed: Yes Fascia: No Tendon: No Muscle: No [name], [name] ([id number]) Joint: No Bone: No Epithelialization: Large (67-100%) None N/A Periwound Skin Texture: Excoriation: No Excoriation: No No Abnormalities Noted Induration: No Induration: No Callus: No Callus: No Crepitus: No Crepitus: No Rash: No Rash: No Scarring: No Scarring: No Periwound Skin Moisture: Dry/Scaly: Yes Maceration: No No Abnormalities Noted Maceration: No Dry/Scaly: No Periwound Skin Color: Atrophie Blanche: No Atrophie Blanche: No No Abnormalities Noted Cyanosis: No Cyanosis: No Ecchymosis: No Ecchymosis: No Erythema: No Erythema: No Hemosiderin Staining: No Hemosiderin Staining: No Mottled: No Mottled: No Pallor: No Pallor: No Rubor: No Rubor: No Temperature: No Abnormality No Abnormality N/A Tenderness on Palpation: No No No Wound Preparation: Ulcer Cleansing: Ulcer Cleansing: N/A Rinsed/Irrigated with Saline Rinsed/Irrigated with Saline Topical Anesthetic Applied: Topical Anesthetic Applied: None None Wound Number: 7 [id number] N/A Photos: No Photos No Photos N/A Wound Location: Right Gluteus Left, Lateral Foot N/A Wounding Event: Gradually Appeared Gradually Appeared N/A Primary Etiology: Pressure Ulcer Diabetic Wound/Ulcer of the N/A Lower Extremity Comorbid History: Anemia,  Arrhythmia, Coronary N/A N/A Artery Disease, Hypotension, Peripheral Arterial Disease, Type II Diabetes, Osteoarthritis Date Acquired: 09/18/2016 11/27/2017 N/A Weeks of Treatment: 8 8 N/A Wound Status: Open Open N/A Pending Amputation on No Yes N/A Presentation: Measurements L x W x D 0.8x0.4x0.5 2x3x0.1 N/A (cm) Area (cm) : 0.251  4.712 N/A Volume (cm) : 0.126 0.471 N/A % Reduction in Area: 83.20% 95.30% N/A % Reduction in Volume: 87.90% 95.30% N/A Classification: Category/Stage IV Grade 2 N/A Exudate Amount: Large N/A N/A Exudate Type: Serosanguineous N/A N/A Exudate Color: red, brown N/A N/A Wound Margin: Distinct, outline attached N/A N/A Granulation Amount: Large (67-100%) N/A N/A Granulation Quality: Red N/A N/A Necrotic Amount: Small (1-33%) N/A N/A [name], [name] ([id number]) Necrotic Tissue: Adherent Slough N/A N/A Exposed Structures: Fat Layer (Subcutaneous N/A N/A Tissue) Exposed: Yes Epithelialization: Small (1-33%) N/A N/A Periwound Skin Texture: Excoriation: Yes No Abnormalities Noted N/A Induration: No Callus: No Crepitus: No Rash: No Scarring: No Periwound Skin Moisture: Maceration: No No Abnormalities Noted N/A Dry/Scaly: No Periwound Skin Color: Atrophie Blanche: No No Abnormalities Noted N/A Cyanosis: No Ecchymosis: No Erythema: No Hemosiderin Staining: No Mottled: No Pallor: No Rubor: No Temperature: No Abnormality N/A N/A Tenderness on Palpation: Yes No N/A Wound Preparation: Ulcer Cleansing: N/A N/A Rinsed/Irrigated with Saline Topical Anesthetic Applied: Other: lidocaine Treatment Notes Electronic Signature(s) Signed: 02/13/2018 4:53:17 PM By: Montey Hora Entered By: Montey Hora on 02/13/2018 14:10:32 Samaan, [name] ([id number]) -------------------------------------------------------------------------------- [place] Details Patient Name: [name], [name]. Date of Service: 02/13/2018 1:30 PM Medical Record Number: [id number] Patient Account Number: [id number] Date of Birth/Sex: [birth year] (52 y.o. F) Treating [name]: Montey Hora Primary Care Everly Rubalcava: Delight Stare Other Clinician: Referring Kalani Sthilaire: Delight Stare Treating Kaeleigh Westendorf/Extender: STONE III, HOYT Weeks in Treatment: 8 Active Inactive ` Abuse / Safety / Falls / Self Care  Management Nursing Diagnoses: Potential for falls Goals: Patient will not experience any injury related to falls Date Initiated: 12/18/2017 Target Resolution Date: 02/21/2018 Goal Status: Active Interventions: Assess Activities of Daily Living upon admission and as needed Assess fall risk on admission and as needed Assess: immobility, friction, shearing, incontinence upon admission and as needed Assess impairment of mobility on admission and as needed per policy Assess personal safety and home safety (as indicated) on admission and as needed Assess self care needs on admission and as needed Provide education on basic hygiene Provide education on fall prevention Treatment Activities: Education provided on Basic Hygiene : 12/18/2017 Notes: ` Necrotic Tissue Nursing Diagnoses: Impaired tissue integrity related to necrotic/devitalized tissue Knowledge deficit related to management of necrotic/devitalized tissue Goals: Patient/caregiver will verbalize understanding of reason and process for debridement of necrotic tissue Date Initiated: 12/18/2017 Target Resolution Date: 03/28/2018 Goal Status: Active Interventions: Assess patient pain level pre-, during and post procedure and prior to discharge Provide education on necrotic tissue and debridement process Notes: YUVAL, NOLET ([id number]) ` Nutrition Nursing Diagnoses: Imbalanced nutrition Impaired glucose control: actual or potential Potential for alteratiion in Nutrition/Potential for imbalanced nutrition Goals: Patient/caregiver verbalizes understanding of need to maintain therapeutic glucose control per primary care physician Date Initiated: 12/18/2017 Target Resolution Date: 02/21/2018 Goal Status: Active Interventions: Assess patient nutrition upon admission and as needed per policy Notes: ` Orientation to the Wound Care Program Nursing Diagnoses: Knowledge deficit related to the wound healing center  program Goals: Patient/caregiver will verbalize understanding of the [place] Program Date Initiated: 12/18/2017 Target Resolution Date: 12/27/2017 Goal Status: Active Interventions: Provide education on orientation to the wound center Notes: `  Pain, Acute or Chronic Nursing Diagnoses: Pain, acute or chronic: actual or potential Potential alteration in comfort, pain Goals: Patient/caregiver will verbalize adequate pain control between visits Date Initiated: 12/18/2017 Target Resolution Date: 03/28/2018 Goal Status: Active Interventions: Complete pain assessment as per visit requirements Notes: ` Wound/Skin Impairment [name], [name] ([id number]) Nursing Diagnoses: Impaired tissue integrity Knowledge deficit related to ulceration/compromised skin integrity Goals: Ulcer/skin breakdown will have a volume reduction of 80% by week 12 Date Initiated: 12/18/2017 Target Resolution Date: 03/21/2018 Goal Status: Active Interventions: Assess patient/caregiver ability to perform ulcer/skin care regimen upon admission and as needed Assess ulceration(s) every visit Notes: Electronic Signature(s) Signed: 02/13/2018 4:53:17 PM By: Montey Hora Entered By: Montey Hora on 02/13/2018 14:10:21 [name], [place]. ([id number]) -------------------------------------------------------------------------------- Pain Assessment Details Patient Name: [name]. Date of Service: 02/13/2018 1:30 PM Medical Record Number: [id number] Patient Account Number: [id number] Date of Birth/Sex: 1966-04-05 (52 y.o. F) Treating [name]: Roger Shelter Primary Care Shawniece Oyola: Delight Stare Other Clinician: Referring Faiga Stones: Delight Stare Treating Bayan Hedstrom/Extender: STONE III, HOYT Weeks in Treatment: 8 Active Problems Location of Pain Severity and Description of Pain Patient Has Paino Yes Site Locations Pain Location: Generalized Pain Duration of the Pain. Constant / Intermittento  Intermittent Rate the pain. Current Pain Level: 7 Character of Pain Describe the Pain: Aching Pain Management and Medication Current Pain Management: Electronic Signature(s) Signed: 02/13/2018 4:26:13 PM By: Roger Shelter Entered By: Roger Shelter on 02/13/2018 13:43:04 Kusek, [name] ([id number]) -------------------------------------------------------------------------------- Patient/Caregiver Education Details Patient Name: [name]. Date of Service: 02/13/2018 1:30 PM Medical Record Number: [id number] Patient Account Number: [id number] Date of Birth/Gender: Oct 28, 1966 (52 y.o. F) Treating [name]: Ahmed Prima Primary Care Physician: Delight Stare Other Clinician: Referring Physician: Delight Stare Treating Physician/Extender: Melburn Hake, HOYT Weeks in Treatment: 8 Education Assessment Education Provided To: Patient Education Topics Provided Wound/Skin Impairment: Handouts: Caring for Your Ulcer, Other: change dressing as ordered Methods: Demonstration, Explain/Verbal Responses: State content correctly Electronic Signature(s) Signed: 02/19/2018 4:32:30 PM By: Alric Quan Entered By: Alric Quan on 02/13/2018 14:35:33 [name], [name] ([id number]) -------------------------------------------------------------------------------- Wound Assessment Details Patient Name: [name]. Date of Service: 02/13/2018 1:30 PM Medical Record Number: [id number] Patient Account Number: [id number] Date of Birth/Sex: [birth year] (52 y.o. F) Treating [name]: Roger Shelter Primary Care Kosha Jaquith: Delight Stare Other Clinician: Referring Shai Mckenzie: Delight Stare Treating Cobie Marcoux/Extender: STONE III, HOYT Weeks in Treatment: 8 Wound Status Wound Number: 10 Primary Diabetic Wound/Ulcer of the Lower Etiology: Extremity Wound Location: Left, Circumferential Toe Fifth Wound Status: Open Wounding Event: Gradually Appeared Date Acquired: 11/27/2017 Weeks Of Treatment: 6 Clustered  Wound: No Photos Photo Uploaded By: [name], [name], [name], CWS, Kim on 02/13/2018 16:49:46 Wound Measurements Length: (cm) 2 Width: (cm) 1.2 Depth: (cm) 0.1 Area: (cm) 1.885 Volume: (cm) 0.188 % Reduction in Area: 80% % Reduction in Volume: 80% Wound Description Classification: Grade 1 Periwound Skin Texture Texture Color No Abnormalities Noted: No No Abnormalities Noted: No Moisture No Abnormalities Noted: No Treatment Notes Wound #10 (Left, Circumferential Toe Fifth) 1. Cleansed with: Clean wound with Normal Saline 2. Anesthetic Topical Lidocaine 4% cream to wound bed prior to debridement Notes iodine MADDIX, KLIEWER ([id number]) Electronic Signature(s) Signed: 02/13/2018 4:26:13 PM By: Roger Shelter Entered By: Roger Shelter on 02/13/2018 13:56:06 [name], [place]. ([id number]) -------------------------------------------------------------------------------- Wound Assessment Details Patient Name: NANDA, BITTICK. Date of Service: 02/13/2018 1:30 PM Medical Record Number: [id number] Patient Account Number: [id number] Date of Birth/Sex: January 31, 1966 (52 y.o. F) Treating [name]: Roger Shelter Primary Care Zechariah Bissonnette: Delight Stare Other  Clinician: Referring Shevy Yaney: Delight Stare Treating Tomas Schamp/Extender: STONE III, HOYT Weeks in Treatment: 8 Wound Status Wound Number: 11 Primary Etiology: Trauma, Other Wound Location: Right, Medial Lower Leg Wound Status: Healed - Epithelialized Wounding Event: Trauma Date Acquired: 01/12/2018 Weeks Of Treatment: 4 Clustered Wound: No Photos Photo Uploaded By: [name], [name], [name], CWS, Kim on 02/13/2018 16:49:46 Wound Measurements Length: (cm) 0 Width: (cm) 0 Depth: (cm) 0 Area: (cm) 0 Volume: (cm) 0 % Reduction in Area: 100% % Reduction in Volume: 100% Wound Description Full Thickness Without Exposed Support Classification: Structures Periwound Skin Texture Texture Color No Abnormalities Noted: No No Abnormalities Noted:  No Moisture No Abnormalities Noted: No Electronic Signature(s) Signed: 02/13/2018 4:26:13 PM By: Roger Shelter Entered By: Roger Shelter on 02/13/2018 13:56:07 Chopin, Gregory. ([id number]) -------------------------------------------------------------------------------- Wound Assessment Details Patient Name: [name]. Date of Service: 02/13/2018 1:30 PM Medical Record Number: [id number] Patient Account Number: [id number] Date of Birth/Sex: [birth year] (52 y.o. F) Treating [name]: Roger Shelter Primary Care Molina Hollenback: Delight Stare Other Clinician: Referring Josejuan Hoaglin: Delight Stare Treating Kahleb Mcclane/Extender: STONE III, HOYT Weeks in Treatment: 8 Wound Status Wound Number: 3 Primary Diabetic Wound/Ulcer of the Lower Extremity Etiology: Wound Location: Right Malleolus - Lateral Wound Open Wounding Event: Pressure Injury Status: Date Acquired: 09/23/2016 Comorbid Anemia, Arrhythmia, Coronary Artery Disease, Weeks Of Treatment: 8 History: Hypotension, Peripheral Arterial Disease, Type Clustered Wound: No II Diabetes, Osteoarthritis Photos Photo Uploaded By: [name], [name], [name], CWS, Kim on 02/13/2018 16:50:22 Wound Measurements Length: (cm) 0.3 Width: (cm) 0.3 Depth: (cm) 0.2 Area: (cm) 0.071 Volume: (cm) 0.014 % Reduction in Area: 91.6% % Reduction in Volume: 91.8% Epithelialization: Medium (34-66%) Tunneling: No Undermining: No Wound Description Classification: Grade 2 Wound Margin: Distinct, outline attached Exudate Amount: Large Exudate Type: Serous Exudate Color: amber Foul Odor After Cleansing: No Slough/Fibrino Yes Wound Bed Granulation Amount: Medium (34-66%) Exposed Structure Granulation Quality: Pink Fascia Exposed: No Necrotic Amount: Medium (34-66%) Fat Layer (Subcutaneous Tissue) Exposed: Yes Necrotic Quality: Eschar, Adherent Slough Tendon Exposed: No Muscle Exposed: No Joint Exposed: No Bone Exposed: No Periwound Skin Texture Texture  Color No Abnormalities Noted: No No Abnormalities Noted: No Reveron, Jenet S. ([id number]) Callus: No Atrophie Blanche: No Crepitus: No Cyanosis: No Excoriation: No Ecchymosis: No Induration: No Erythema: No Rash: No Hemosiderin Staining: No Scarring: No Mottled: No Pallor: No Moisture Rubor: No No Abnormalities Noted: No Dry / Scaly: No Temperature / Pain Maceration: No Temperature: No Abnormality Wound Preparation Ulcer Cleansing: Rinsed/Irrigated with Saline Topical Anesthetic Applied: None Treatment Notes Wound #3 (Right, Lateral Malleolus) 1. Cleansed with: Clean wound with Normal Saline 2. Anesthetic Topical Lidocaine 4% cream to wound bed prior to debridement 3. Peri-wound Care: Skin Prep 4. Dressing Applied: Prisma Ag 5. Secondary Dressing Applied Bordered Foam Dressing Foam Electronic Signature(s) Signed: 02/13/2018 4:26:13 PM By: Roger Shelter Entered By: Roger Shelter on 02/13/2018 13:52:05 Tener, Lupe S. ([id number]) -------------------------------------------------------------------------------- Wound Assessment Details Patient Name: JALEXUS, BRETT S. Date of Service: 02/13/2018 1:30 PM Medical Record Number: [id number] Patient Account Number: [id number] Date of Birth/Sex: [birth year] (52 y.o. F) Treating [name]: Roger Shelter Primary Care Shakeitha Umbaugh: Delight Stare Other Clinician: Referring Haylie Mccutcheon: Delight Stare Treating Tymarion Everard/Extender: STONE III, HOYT Weeks in Treatment: 8 Wound Status Wound Number: 4 Primary Diabetic Wound/Ulcer of the Lower Extremity Etiology: Wound Location: Left Calcaneus - Lateral Wound Open Wounding Event: Blister Status: Date Acquired: 11/27/2017 Comorbid Anemia, Arrhythmia, Coronary Artery Disease, Weeks Of Treatment: 8 History: Hypotension, Peripheral Arterial Disease, Type Clustered Wound: No II Diabetes, Osteoarthritis Pending Amputation On  Presentation Photos Photo Uploaded By: [name], [name], [name], CWS, Kim on  02/13/2018 16:50:22 Wound Measurements Length: (cm) 0 % Re Width: (cm) 0 % Re Depth: (cm) 0 Epit Area: (cm) 0 Tun Volume: (cm) 0 Und duction in Area: 100% duction in Volume: 100% helialization: Large (67-100%) neling: No ermining: No Wound Description Classification: Grade 2 Wound Margin: Distinct, outline attached Exudate Amount: Medium Exudate Type: Serosanguineous Exudate Color: red, brown Foul Odor After Cleansing: No Slough/Fibrino Yes Wound Bed Granulation Amount: None Present (0%) Exposed Structure Necrotic Amount: None Present (0%) Fascia Exposed: No Fat Layer (Subcutaneous Tissue) Exposed: Yes Tendon Exposed: No Muscle Exposed: No Joint Exposed: No Bone Exposed: No Periwound Skin Texture Texture Color No Abnormalities Noted: No No Abnormalities Noted: No Man, Jonah S. ([id number]) Callus: No Atrophie Blanche: No Crepitus: No Cyanosis: No Excoriation: No Ecchymosis: No Induration: No Erythema: No Rash: No Hemosiderin Staining: No Scarring: No Mottled: No Pallor: No Moisture Rubor: No No Abnormalities Noted: No Dry / Scaly: Yes Temperature / Pain Maceration: No Temperature: No Abnormality Wound Preparation Ulcer Cleansing: Rinsed/Irrigated with Saline Topical Anesthetic Applied: None Electronic Signature(s) Signed: 02/13/2018 4:26:13 PM By: Roger Shelter Entered By: Roger Shelter on 02/13/2018 13:52:55 Mozingo, Elverta S. ([id number]) -------------------------------------------------------------------------------- Wound Assessment Details Patient Name: [name]. Date of Service: 02/13/2018 1:30 PM Medical Record Number: [id number] Patient Account Number: [id number] Date of Birth/Sex: 04-Feb-1966 (52 y.o. F) Treating [name]: Roger Shelter Primary Care Ayriana Wix: Delight Stare Other Clinician: Referring Ruba Outen: Delight Stare Treating Lydia Meng/Extender: STONE III, HOYT Weeks in Treatment: 8 Wound Status Wound Number: 5 Primary  Diabetic Wound/Ulcer of the Lower Extremity Etiology: Wound Location: Left Toe Great - Circumfernential Wound Open Wounding Event: Gradually Appeared Status: Date Acquired: 11/27/2017 Comorbid Anemia, Arrhythmia, Coronary Artery Disease, Weeks Of Treatment: 8 History: Hypotension, Peripheral Arterial Disease, Type Clustered Wound: No II Diabetes, Osteoarthritis Pending Amputation On Presentation Photos Photo Uploaded By: [name], [name], [name], CWS, Kim on 02/13/2018 16:50:45 Wound Measurements Length: (cm) 2.4 Width: (cm) 2 Depth: (cm) 0.1 Area: (cm) 3.77 Volume: (cm) 0.377 % Reduction in Area: 87.5% % Reduction in Volume: 87.5% Epithelialization: None Wound Description Classification: Grade 2 Wound Margin: Distinct, outline attached Exudate Amount: None Present Foul Odor After Cleansing: No Slough/Fibrino Yes Wound Bed Granulation Amount: None Present (0%) Necrotic Amount: Large (67-100%) Necrotic Quality: Eschar Periwound Skin Texture Texture Color No Abnormalities Noted: No No Abnormalities Noted: No Callus: No Atrophie Blanche: No Crepitus: No Cyanosis: No Excoriation: No Ecchymosis: No Induration: No Erythema: No Rash: No Hemosiderin Staining: No Scarring: No Mottled: No Pollman, Jarika S. ([id number]) Moisture Pallor: No No Abnormalities Noted: No Rubor: No Dry / Scaly: No Temperature / Pain Maceration: No Temperature: No Abnormality Wound Preparation Ulcer Cleansing: Rinsed/Irrigated with Saline Topical Anesthetic Applied: None Treatment Notes Wound #5 (Left, Circumferential Toe Great) 1. Cleansed with: Clean wound with Normal Saline 2. Anesthetic Topical Lidocaine 4% cream to wound bed prior to debridement Notes iodine Electronic Signature(s) Signed: 02/13/2018 4:26:13 PM By: Roger Shelter Entered By: Roger Shelter on 02/13/2018 13:53:53 Hamidi, Evon S.  ([id number]) -------------------------------------------------------------------------------- Wound Assessment Details Patient Name: [name]. Date of Service: 02/13/2018 1:30 PM Medical Record Number: [id number] Patient Account Number: [id number] Date of Birth/Sex: 05-17-1966 (52 y.o. F) Treating [name]: Roger Shelter Primary Care Natalija Mavis: Delight Stare Other Clinician: Referring Auri Jahnke: Delight Stare Treating Thirza Pellicano/Extender: STONE III, HOYT Weeks in Treatment: 8 Wound Status Wound Number: 6 Primary Diabetic Wound/Ulcer of the Lower Etiology: Extremity Wound Location: Left, Circumferential Toe Fourth Wound Status: Open Wounding  Event: Gradually Appeared Date Acquired: 11/27/2017 Weeks Of Treatment: 8 Clustered Wound: No Pending Amputation On Presentation Photos Photo Uploaded By: [name], [name], [name], CWS, Kim on 02/13/2018 16:51:16 Wound Measurements Length: (cm) 2 Width: (cm) 2.2 Depth: (cm) 0.1 Area: (cm) 3.456 Volume: (cm) 0.346 % Reduction in Area: 26.7% % Reduction in Volume: 26.5% Wound Description Classification: Grade 2 Periwound Skin Texture Texture Color No Abnormalities Noted: No No Abnormalities Noted: No Moisture No Abnormalities Noted: No Treatment Notes Wound #6 (Left, Circumferential Toe Fourth) 1. Cleansed with: Clean wound with Normal Saline 2. Anesthetic Topical Lidocaine 4% cream to wound bed prior to debridement Notes iodine JANNELLY, BERGREN ([id number]) Electronic Signature(s) Signed: 02/13/2018 4:26:13 PM By: Roger Shelter Entered By: Roger Shelter on 02/13/2018 13:54:23 Granillo, Karlin S. ([id number]) -------------------------------------------------------------------------------- Wound Assessment Details Patient Name: [name]. Date of Service: 02/13/2018 1:30 PM Medical Record Number: [id number] Patient Account Number: [id number] Date of Birth/Sex: [birth year] (52 y.o. F) Treating [name]: Roger Shelter Primary Care  Viaan Knippenberg: Delight Stare Other Clinician: Referring Norma Ignasiak: Delight Stare Treating Canna Nickelson/Extender: STONE III, HOYT Weeks in Treatment: 8 Wound Status Wound Number: 7 Primary Pressure Ulcer Etiology: Wound Location: Right Gluteus Wound Open Wounding Event: Gradually Appeared Status: Date Acquired: 09/18/2016 Comorbid Anemia, Arrhythmia, Coronary Artery Disease, Weeks Of Treatment: 8 History: Hypotension, Peripheral Arterial Disease, Type Clustered Wound: No II Diabetes, Osteoarthritis Photos Photo Uploaded By: [name], [name], [name], CWS, Kim on 02/13/2018 16:51:37 Wound Measurements Length: (cm) 0.8 Width: (cm) 0.4 Depth: (cm) 0.5 Area: (cm) 0.251 Volume: (cm) 0.126 % Reduction in Area: 83.2% % Reduction in Volume: 87.9% Epithelialization: Small (1-33%) Tunneling: No Undermining: No Wound Description Classification: Category/Stage IV Wound Margin: Distinct, outline attached Exudate Amount: Large Exudate Type: Serosanguineous Exudate Color: red, brown Foul Odor After Cleansing: No Slough/Fibrino Yes Wound Bed Granulation Amount: Large (67-100%) Exposed Structure Granulation Quality: Red Fat Layer (Subcutaneous Tissue) Exposed: Yes Necrotic Amount: Small (1-33%) Necrotic Quality: Adherent Slough Periwound Skin Texture Texture Color No Abnormalities Noted: No No Abnormalities Noted: No Callus: No Atrophie Blanche: No Crepitus: No Cyanosis: No Excoriation: Yes Ecchymosis: No Bartunek, Marjoria S. ([id number]) Induration: No Erythema: No Rash: No Hemosiderin Staining: No Scarring: No Mottled: No Pallor: No Moisture Rubor: No No Abnormalities Noted: No Dry / Scaly: No Temperature / Pain Maceration: No Temperature: No Abnormality Tenderness on Palpation: Yes Wound Preparation Ulcer Cleansing: Rinsed/Irrigated with Saline Topical Anesthetic Applied: Other: lidocaine, Treatment Notes Wound #7 (Right Gluteus) 1. Cleansed with: Clean wound with Normal  Saline 2. Anesthetic Topical Lidocaine 4% cream to wound bed prior to debridement 3. Peri-wound Care: Skin Prep 4. Dressing Applied: Prisma Ag 5. Secondary Dressing Applied Bordered Foam Dressing Notes drawtex Electronic Signature(s) Signed: 02/13/2018 4:26:13 PM By: Roger Shelter Entered By: Roger Shelter on 02/13/2018 13:59:40 Lothamer, [name] ([id number]) -------------------------------------------------------------------------------- Wound Assessment Details Patient Name: MONET, NORTH S. Date of Service: 02/13/2018 1:30 PM Medical Record Number: [id number] Patient Account Number: [id number] Date of Birth/Sex: September 30, 1966 (52 y.o. F) Treating [name]: Roger Shelter Primary Care Kaedyn Polivka: Delight Stare Other Clinician: Referring Merisa Julio: Delight Stare Treating Monee Dembeck/Extender: STONE III, HOYT Weeks in Treatment: 8 Wound Status Wound Number: 8 Primary Diabetic Wound/Ulcer of the Lower Etiology: Extremity Wound Location: Left, Lateral Foot Wound Status: Open Wounding Event: Gradually Appeared Date Acquired: 11/27/2017 Weeks Of Treatment: 8 Clustered Wound: No Pending Amputation On Presentation Photos Photo Uploaded By: [name], [name], [name], CWS, Kim on 02/13/2018 16:51:50 Wound Measurements Length: (cm) 2 Width: (cm) 3 Depth: (cm) 0.1 Area: (cm) 4.712 Volume: (cm) 0.471 %  Reduction in Area: 95.3% % Reduction in Volume: 95.3% Wound Description Classification: Grade 2 Periwound Skin Texture Texture Color No Abnormalities Noted: No No Abnormalities Noted: No Moisture No Abnormalities Noted: No Treatment Notes Wound #8 (Left, Lateral Foot) 1. Cleansed with: Clean wound with Normal Saline 2. Anesthetic Topical Lidocaine 4% cream to wound bed prior to debridement Notes iodine LATOI, GIRALDO ([id number]) Electronic Signature(s) Signed: 02/13/2018 4:26:13 PM By: Roger Shelter Entered By: Roger Shelter on 02/13/2018 13:55:13 Staniszewski, [name]  ([id number]) -------------------------------------------------------------------------------- Vitals Details Patient Name: [name]. Date of Service: 02/13/2018 1:30 PM Medical Record Number: [id number] Patient Account Number: [id number] Date of Birth/Sex: 27-Dec-1965 (52 y.o. F) Treating [name]: Roger Shelter Primary Care Dalonda Simoni: Delight Stare Other Clinician: Referring Raymond Bhardwaj: Delight Stare Treating Mariaceleste Herrera/Extender: STONE III, HOYT Weeks in Treatment: 8 Vital Signs Time Taken: 13:43 Temperature (F): 98.0 Height (in): 67 Pulse (bpm): 78 Respiratory Rate (breaths/min): 18 Blood Pressure (mmHg): 82/58 Reference Range: 80 - 120 mg / dl Notes BP 88/58 patient states she feels fine. PA Notified. Electronic Signature(s) Signed: 02/13/2018 4:26:13 PM By: Roger Shelter Entered By: Roger Shelter on 02/13/2018 13:45:22

## 2018-02-27 ENCOUNTER — Encounter (HOSPITAL_COMMUNITY): Payer: Self-pay

## 2018-02-27 ENCOUNTER — Emergency Department (HOSPITAL_COMMUNITY): Payer: Medicare Other

## 2018-02-27 ENCOUNTER — Encounter: Payer: Medicare Other | Attending: Physician Assistant | Admitting: Physician Assistant

## 2018-02-27 ENCOUNTER — Emergency Department (HOSPITAL_COMMUNITY)
Admission: EM | Admit: 2018-02-27 | Discharge: 2018-02-27 | Disposition: A | Discharge status: Home / Self Care | Payer: Medicare Other | Attending: Emergency Medicine | Admitting: Emergency Medicine

## 2018-02-27 ENCOUNTER — Other Ambulatory Visit: Payer: Self-pay

## 2018-02-27 DIAGNOSIS — E1151 Type 2 diabetes mellitus with diabetic peripheral angiopathy without gangrene: Secondary | ICD-10-CM | POA: Insufficient documentation

## 2018-02-27 DIAGNOSIS — F17210 Nicotine dependence, cigarettes, uncomplicated: Secondary | ICD-10-CM | POA: Insufficient documentation

## 2018-02-27 DIAGNOSIS — I739 Peripheral vascular disease, unspecified: Secondary | ICD-10-CM | POA: Insufficient documentation

## 2018-02-27 DIAGNOSIS — Z87891 Personal history of nicotine dependence: Secondary | ICD-10-CM | POA: Diagnosis not present

## 2018-02-27 DIAGNOSIS — Z8249 Family history of ischemic heart disease and other diseases of the circulatory system: Secondary | ICD-10-CM | POA: Diagnosis not present

## 2018-02-27 DIAGNOSIS — L97312 Non-pressure chronic ulcer of right ankle with fat layer exposed: Secondary | ICD-10-CM | POA: Insufficient documentation

## 2018-02-27 DIAGNOSIS — M79672 Pain in left foot: Secondary | ICD-10-CM | POA: Diagnosis present

## 2018-02-27 DIAGNOSIS — Z95820 Peripheral vascular angioplasty status with implants and grafts: Secondary | ICD-10-CM | POA: Diagnosis not present

## 2018-02-27 DIAGNOSIS — E119 Type 2 diabetes mellitus without complications: Secondary | ICD-10-CM | POA: Diagnosis not present

## 2018-02-27 DIAGNOSIS — Z79899 Other long term (current) drug therapy: Secondary | ICD-10-CM | POA: Diagnosis not present

## 2018-02-27 DIAGNOSIS — E11621 Type 2 diabetes mellitus with foot ulcer: Secondary | ICD-10-CM | POA: Insufficient documentation

## 2018-02-27 DIAGNOSIS — H548 Legal blindness, as defined in USA: Secondary | ICD-10-CM | POA: Insufficient documentation

## 2018-02-27 DIAGNOSIS — E11622 Type 2 diabetes mellitus with other skin ulcer: Secondary | ICD-10-CM | POA: Diagnosis not present

## 2018-02-27 DIAGNOSIS — I4891 Unspecified atrial fibrillation: Secondary | ICD-10-CM | POA: Insufficient documentation

## 2018-02-27 DIAGNOSIS — I70245 Atherosclerosis of native arteries of left leg with ulceration of other part of foot: Secondary | ICD-10-CM | POA: Diagnosis not present

## 2018-02-27 DIAGNOSIS — M1990 Unspecified osteoarthritis, unspecified site: Secondary | ICD-10-CM | POA: Diagnosis not present

## 2018-02-27 DIAGNOSIS — J45909 Unspecified asthma, uncomplicated: Secondary | ICD-10-CM | POA: Insufficient documentation

## 2018-02-27 DIAGNOSIS — G8929 Other chronic pain: Secondary | ICD-10-CM | POA: Insufficient documentation

## 2018-02-27 DIAGNOSIS — L97522 Non-pressure chronic ulcer of other part of left foot with fat layer exposed: Secondary | ICD-10-CM | POA: Diagnosis not present

## 2018-02-27 DIAGNOSIS — L97329 Non-pressure chronic ulcer of left ankle with unspecified severity: Secondary | ICD-10-CM | POA: Insufficient documentation

## 2018-02-27 DIAGNOSIS — L98412 Non-pressure chronic ulcer of buttock with fat layer exposed: Secondary | ICD-10-CM | POA: Diagnosis not present

## 2018-02-27 DIAGNOSIS — L97529 Non-pressure chronic ulcer of other part of left foot with unspecified severity: Secondary | ICD-10-CM | POA: Insufficient documentation

## 2018-02-27 DIAGNOSIS — I2510 Atherosclerotic heart disease of native coronary artery without angina pectoris: Secondary | ICD-10-CM | POA: Insufficient documentation

## 2018-02-27 DIAGNOSIS — M726 Necrotizing fasciitis: Secondary | ICD-10-CM | POA: Insufficient documentation

## 2018-02-27 DIAGNOSIS — E1140 Type 2 diabetes mellitus with diabetic neuropathy, unspecified: Secondary | ICD-10-CM | POA: Diagnosis not present

## 2018-02-27 DIAGNOSIS — Z794 Long term (current) use of insulin: Secondary | ICD-10-CM | POA: Diagnosis not present

## 2018-02-27 LAB — CBC WITH DIFFERENTIAL/PLATELET
Basophils Absolute: 0 10*3/uL (ref 0.0–0.1)
Basophils Relative: 0 %
EOS ABS: 0.2 10*3/uL (ref 0.0–0.7)
Eosinophils Relative: 2 %
HEMATOCRIT: 36.3 % (ref 36.0–46.0)
HEMOGLOBIN: 11.4 g/dL — AB (ref 12.0–15.0)
LYMPHS ABS: 2.1 10*3/uL (ref 0.7–4.0)
Lymphocytes Relative: 25 %
MCH: 25 pg — AB (ref 26.0–34.0)
MCHC: 31.4 g/dL (ref 30.0–36.0)
MCV: 79.6 fL (ref 78.0–100.0)
MONO ABS: 0.5 10*3/uL (ref 0.1–1.0)
MONOS PCT: 6 %
NEUTROS PCT: 67 %
Neutro Abs: 5.6 10*3/uL (ref 1.7–7.7)
Platelets: 206 10*3/uL (ref 150–400)
RBC: 4.56 MIL/uL (ref 3.87–5.11)
RDW: 15.5 % (ref 11.5–15.5)
WBC: 8.3 10*3/uL (ref 4.0–10.5)

## 2018-02-27 LAB — I-STAT BETA HCG BLOOD, ED (MC, WL, AP ONLY): HCG, QUANTITATIVE: 8 m[IU]/mL — AB (ref <5)

## 2018-02-27 LAB — I-STAT CG4 LACTIC ACID, ED: LACTIC ACID, VENOUS: 1.42 mmol/L (ref 0.5–1.9)

## 2018-02-27 LAB — COMPREHENSIVE METABOLIC PANEL
ALBUMIN: 2.6 g/dL — AB (ref 3.5–5.0)
ALK PHOS: 102 U/L (ref 38–126)
ALT: 6 U/L — AB (ref 14–54)
AST: 9 U/L — AB (ref 15–41)
Anion gap: 10 (ref 5–15)
BUN: 25 mg/dL — ABNORMAL HIGH (ref 6–20)
CO2: 22 mmol/L (ref 22–32)
CREATININE: 0.94 mg/dL (ref 0.44–1.00)
Calcium: 8.8 mg/dL — ABNORMAL LOW (ref 8.9–10.3)
Chloride: 102 mmol/L (ref 101–111)
GFR calc non Af Amer: >60 mL/min (ref >60)
Glucose, Bld: 177 mg/dL — ABNORMAL HIGH (ref 65–99)
Potassium: 4.3 mmol/L (ref 3.5–5.1)
SODIUM: 134 mmol/L — AB (ref 135–145)
Total Bilirubin: 0.2 mg/dL — ABNORMAL LOW (ref 0.3–1.2)
Total Protein: 6.5 g/dL (ref 6.5–8.1)

## 2018-02-27 MED ORDER — IOPAMIDOL (ISOVUE-370) INJECTION 76%
INTRAVENOUS | Status: AC
  Filled 2018-02-27: qty 100

## 2018-02-27 MED ORDER — IOPAMIDOL (ISOVUE-370) INJECTION 76%
100.0000 mL | Freq: Once | INTRAVENOUS | Status: AC | PRN
  Administered 2018-02-27: 100 mL via INTRAVENOUS

## 2018-02-27 NOTE — Discharge Instructions (Signed)
Follow-up with vascular surgery as planned.  They can review the angiogram that was done today.

## 2018-02-27 NOTE — ED Notes (Signed)
Patient transported to CT SCAN . 

## 2018-02-27 NOTE — ED Triage Notes (Signed)
Pt endorses being sent by wound clinic for vascular evaluation of left foot. Pt endorses that "my left foot has been dying since I had a catherization a couple months ago because they pinched off a stent that was in that leg."

## 2018-02-27 NOTE — Progress Notes (Signed)
Radiology Event Note  Moderate extravasation of contrast and or saline in to the right subq forearm.  There is swelling without skin changes.  No UE neurovascular compromise.  Order placed.  JWatts MD

## 2018-02-27 NOTE — ED Provider Notes (Signed)
[name] EMERGENCY DEPARTMENT Provider Note   CSN: [id number] Arrival date & time: 02/27/18  1625     History   Chief Complaint Chief Complaint  Patient presents with  . Foot Pain    HPI [name] is a 52 y.o. female.  HPI Patient presents with left lower extremity wound.  Has chronic wounds on his foot and has been seen by Dr. Trula Slade of vascular surgery.  Has chronic wound care in the center in for further evaluation.  Patient states it is really not been changed.  No fevers.  Has some chronic discoloration.  Seen in conjunction with Dr. Donzetta Matters from vascular surgery in the ER. Past Medical History:  Diagnosis Date  . Arthritis   . Asthma   . Chronic back pain   . Depression   . Diabetes mellitus   . Diabetic neuropathy ([place])   . Family history of adverse reaction to anesthesia    mother had difficlty waking   . 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 GERD (gastroesophageal reflux disease)   . Hyperlipidemia   . Osteomyelitis of right fibula ([place]) 03/05/2017  . PAD (peripheral artery disease) ([place])   . Paroxysmal atrial fibrillation with rapid ventricular response ([place]) 12/02/2017  . Ulcer    Foot    Patient Active Problem List   Diagnosis Date Noted  . 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 Respiratory failure with hypoxia ([place]) 12/02/2017  . Leukocytosis 12/02/2017  . Paroxysmal atrial fibrillation with rapid ventricular response ([place]) 12/02/2017  . Thrombosis 11/24/2017  . Hyperkalemia 06/26/2017  . Atherosclerosis of native artery of right lower extremity with ulceration of ankle ([place]) 05/13/2017  . Osteomyelitis of right fibula ([place]) 03/05/2017  . Non-pressure chronic ulcer of right ankle limited to breakdown of skin ([place]) 01/17/2017  . Elevated lactic acid level   . Osteomyelitis of ankle ([place]) 12/29/2016  . Chronic pain 12/24/2016  . Buttock wound, left, subsequent encounter   .  Hypotension 12/23/2016  . IDDM (insulin dependent diabetes mellitus) ([place]) 12/23/2016  . AKI (acute kidney injury) ([place]) 12/23/2016  . Malnutrition ([place]) 12/23/2016  . Thrush 12/23/2016  . Sepsis ([place]) 11/27/2016  . Shock ([place]) 11/27/2016  . DM2 (diabetes mellitus, type 2) ([place]) 11/27/2016  . Chronic back pain 11/27/2016  . Orthostatic hypotension 11/27/2016  . Demand ischemia ([place])   . Hyperlipidemia LDL goal <70   . Right sided weakness   . Retinal tumor of right eye   . Pressure injury of skin 10/01/2016  . Necrotizing fasciitis ([place]) 09/30/2016  . 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 Paresthesia of both hands 03/05/2016  . DDD (degenerative disc disease), lumbosacral 03/26/2015  . DDD (degenerative disc disease), cervical 03/26/2015  . Sacroiliac joint disease 03/26/2015  . Occipital neuralgia 03/26/2015  . Pain in limb-Bilateral Leg 04/25/2014  . PAD (peripheral artery disease) ([place]) 04/05/2013  . Chronic total occlusion of artery of the extremities ([place]) 02/10/2012  . Peripheral vascular disease, unspecified ([place]) 02/10/2012    Past Surgical History:  Procedure Laterality Date  . ABDOMINAL AORTAGRAM  June 15, 2014  . ABDOMINAL AORTAGRAM N/A 06/15/2014   Procedure: ABDOMINAL Maxcine Ham;  Surgeon: Serafina Mitchell, MD;  Location: [facility] CATH LAB;  Service: Cardiovascular;  Laterality: N/A;  . ABDOMINAL AORTAGRAM N/A 11/22/2014   Procedure: ABDOMINAL AORTAGRAM;  Surgeon: Serafina Mitchell, MD;  Location: [facility] CATH LAB;  Service: Cardiovascular;  Laterality: N/A;  . ABDOMINAL AORTOGRAM W/LOWER EXTREMITY N/A  01/07/2017   Procedure: Abdominal Aortogram w/Lower Extremity;  Surgeon: Serafina Mitchell, MD;  Location: [facility] CV LAB;  Service: Cardiovascular;  Laterality: N/A;  . ABDOMINAL AORTOGRAM W/LOWER EXTREMITY N/A 10/31/2017   Procedure: ABDOMINAL AORTOGRAM W/LOWER EXTREMITY;  Surgeon: Elam Dutch, MD;  Location: [facility] CV LAB;  Service: Cardiovascular;   Laterality: N/A;  . AORTA - BILATERAL FEMORAL ARTERY BYPASS GRAFT N/A 11/28/2017   Procedure: AORTA BIFEMORAL BYPASS USING HEMASHIELD GOLD GRAFT & REIMPLANT IMA;  Surgeon: Serafina Mitchell, MD;  Location[facility] OR;  Service: Vascular;  Laterality: N/A;  . AORTIC ARCH ANGIOGRAPHY N/A 10/31/2017   Procedure: AORTIC ARCH ANGIOGRAPHY;  Surgeon: Elam Dutch, MD;  Location: [facility] CV LAB;  Service: Cardiovascular;  Laterality: N/A;  . APPLICATION OF WOUND VAC  11/28/2017   Procedure: APPLICATION OF WOUND VAC;  Surgeon: Serafina Mitchell, MD;  Location: [facility] OR;  Service: Vascular;;  . ARTERIAL BYPASS SURGRY  07/05/2010   Right Common Femoral to below knee popliteal BPG  . BACK SURGERY     X's  2  . CARDIAC CATHETERIZATION    . CHOLECYSTECTOMY     Gall Bladder  . CYSTECTOMY Right    foot  . CYSTECTOMY Left    wrist  . EMBOLECTOMY Left 11/28/2017   Procedure: Left Lower Extremity Embolectomy, Left Tibial Peroneal Trunk Endarterectomy with Patch Angioplasty; Vein Harvest Small Saphenous Graft Left Lower Leg;  Surgeon: [name], MD;  Location: [facility];  Service: Vascular;  Laterality: Left;  . INTERCOSTAL NERVE BLOCK  November 2015  . INTRAOPERATIVE ARTERIOGRAM  11/28/2017   Procedure: INTRA OPERATIVE ARTERIOGRAM OF LEFT LEG;  Surgeon: Serafina Mitchell, MD;  Location: [facility];  Service: Vascular;;  . IR FLUORO GUIDE CV LINE RIGHT  03/20/2017  . IR US GUIDE VASC ACCESS RIGHT  03/20/2017  . IRRIGATION AND DEBRIDEMENT BUTTOCKS Right 09/30/2016   Procedure: DEBRIDEMENT RIGHT  BUTTOCK WOUND;  Surgeon: Georganna Skeans, MD;  Location: [facility];  Service: General;  Laterality: Right;  . left foot surgery    . left wrist cyst removal Left   . PERIPHERAL VASCULAR CATHETERIZATION N/A 05/07/2016   Procedure: Abdominal Aortogram;  Surgeon: Serafina Mitchell, MD;  Location: [facility] CV LAB;  Service: Cardiovascular;  Laterality: N/A;  . PERIPHERAL VASCULAR CATHETERIZATION N/A 05/07/2016   Procedure:  Lower Extremity Angiography;  Surgeon: Serafina Mitchell, MD;  Location: [facility] CV LAB;  Service: Cardiovascular;  Laterality: N/A;  . PERIPHERAL VASCULAR CATHETERIZATION N/A 05/07/2016   Procedure: Aortic Arch Angiography;  Surgeon: Serafina Mitchell, MD;  Location: [facility] CV LAB;  Service: Cardiovascular;  Laterality: N/A;  . PERIPHERAL VASCULAR CATHETERIZATION N/A 05/07/2016   Procedure: Upper Extremity Angiography;  Surgeon: Serafina Mitchell, MD;  Location: [facility] CV LAB;  Service: Cardiovascular;  Laterality: N/A;  . PERIPHERAL VASCULAR CATHETERIZATION Right 05/07/2016   Procedure: Peripheral Vascular Balloon Angioplasty;  Surgeon: Serafina Mitchell, MD;  Location: [facility] CV LAB;  Service: Cardiovascular;  Laterality: Right;  subclavian  . PERIPHERAL VASCULAR CATHETERIZATION Right 05/07/2016   Procedure: Peripheral Vascular Intervention;  Surgeon: Serafina Mitchell, MD;  Location: [facility] CV LAB;  Service: Cardiovascular;  Laterality: Right;  External  Iliac  . SKIN GRAFT Right 2012   [name] by Dr. Nils Pyle- Right and Left Ankle  . SPINE SURGERY    . THROMBECTOMY FEMORAL ARTERY  11/28/2017   Procedure: THROMBECTOMY  & REVISION OF BILATERAL FEMORAL TO POPLETEAL ARTERIES;  Surgeon: Serafina Mitchell, MD;  Location: [facility] OR;  Service: Vascular;;  . TONSILLECTOMY       OB History   None      Home Medications    Prior to Admission medications   Medication Sig Start Date End Date Taking? Authorizing Provider  HYDROcodone-acetaminophen (NORCO) 10-325 MG tablet Take 1 tablet by mouth 2 (two) times daily.  01/07/18  Yes [provider]  pregabalin (LYRICA) 100 MG capsule Limit 1 tab by mouth twice a day to 3 times a day if tolerated Patient taking differently: Take 100 mg by mouth 3 (three) times daily.  07/18/16  Yes Mohammed Kindle, MD  albuterol (PROVENTIL) 2 MG tablet Take 2 mg by mouth 3 (three) times daily as needed for shortness of breath.     [provider]    cetirizine (ZYRTEC) 10 MG tablet Take 10 mg by mouth daily as needed for allergies.     [provider]  dicyclomine (BENTYL) 10 MG capsule Take 10 mg by mouth 4 (four) times daily as needed for spasms. 08/08/16 01/14/18  [provider]  doxycycline (VIBRA-TABS) 100 MG tablet Take 1 tablet (100 mg total) by mouth 2 (two) times daily. 01/26/18   [name], Lavell Islam, MD  HUMALOG KWIKPEN 100 UNIT/ML KiwkPen Inject 10 Units into the skin See admin instructions. 10 units three times a day before meals per sliding scale 11/11/15   [provider]  LANTUS SOLOSTAR 100 UNIT/ML Solostar Pen Inject 36 Units into the skin 2 (two) times daily.  09/15/14   [provider]  metoprolol tartrate (LOPRESSOR) 25 MG tablet Take 0.5 tablets (12.5 mg total) by mouth 2 (two) times daily. 01/14/18 04/14/18  End, Harrell Gave, MD  omeprazole (PRILOSEC) 40 MG capsule Take 40 mg by mouth daily. 11/06/16   [provider]  ondansetron (ZOFRAN ODT) 4 MG disintegrating tablet Take 1 tablet (4 mg total) by mouth every 8 (eight) hours as needed for nausea or vomiting. 12/22/17   Serafina Mitchell, MD  rivaroxaban (XARELTO) 20 MG TABS tablet Take 1 tablet (20 mg total) by mouth daily with supper. 12/09/17   Ulyses Amor, PA-C  rosuvastatin (CRESTOR) 20 MG tablet Take 1 tablet (20 mg total) by mouth daily at 6 PM. 12/09/17   Ulyses Amor, PA-C    Family History Family History  Problem Relation Age of Onset  . Coronary artery disease Mother   . Peripheral vascular disease Mother   . Heart disease Mother        Before age 1  . Other Mother        Venous insuffiency  . Diabetes Mother   . Hyperlipidemia Mother   . Hypertension Mother   . Varicose Veins Mother   . Heart attack Mother        before age 51  . Heart disease Father   . Diabetes Father   . Diabetes Maternal Grandmother   . Diabetes Paternal Grandmother   . Diabetes Paternal Grandfather   . Diabetes Sister   .  Hypertension Sister   . Diabetes Brother   . Hypertension Brother     Social History Social History   Tobacco Use  . Smoking status: Former Smoker    Years: 30.00    Types: Cigarettes    Last attempt to quit: 02/07/2017    Years since quitting: 1.0  . Smokeless tobacco: Never Used  Substance Use Topics  . Alcohol use: No  Alcohol/week: 0.0 oz  . Drug use: No     Allergies   Patient has no known allergies.   Review of Systems Review of Systems  Constitutional: Negative for appetite change and fever.  Respiratory: Negative for shortness of breath.   Cardiovascular: Negative for chest pain.  Gastrointestinal: Negative for abdominal pain.  Musculoskeletal: Negative for back pain.  Skin: Positive for wound.  Neurological: Negative for weakness.     Physical Exam Updated Vital Signs BP 96/66 (BP Location: Right Arm)   Pulse 64   Temp 98 F (36.7 C) (Oral)   Resp 16   Ht 5\' 7"  (1.702 m)   Wt 76.2 kg (168 lb)   SpO2 97%   BMI 26.31 kg/m   Physical Exam  Constitutional: She appears well-developed.  HENT:  Head: Atraumatic.  Neck: Neck supple.  Cardiovascular: Normal rate.  Abdominal: There is no tenderness.  Musculoskeletal:  Some discoloration of left lower leg and foot.  Chronic wounds on toes but worse on fifth toe.  Unable to palpate pulses but Dr. Donzetta Matters some flow in the foot.  Was able to Doppler healing wound right lateral malleolus.  Neurological: She is alert.  Skin: Skin is warm.  Delayed capillary refill in left foot.  Pressure stockings on right lower extremity.   ED Treatments / Results  Labs (all labs ordered are listed, but only abnormal results are displayed) Labs Reviewed  COMPREHENSIVE METABOLIC PANEL - Abnormal; Notable for the following components:      Result Value   Sodium 134 (*)    Glucose, Bld 177 (*)    BUN 25 (*)    Calcium 8.8 (*)    Albumin 2.6 (*)    AST 9 (*)    ALT 6 (*)    Total Bilirubin 0.2 (*)    All other  components within normal limits  CBC WITH DIFFERENTIAL/PLATELET - Abnormal; Notable for the following components:   Hemoglobin 11.4 (*)    MCH 25.0 (*)    All other components within normal limits  I-STAT BETA HCG BLOOD, ED ([facility], [facility], [facility] ONLY) - Abnormal; Notable for the following components:   I-stat hCG, quantitative 8.0 (*)    All other components within normal limits  I-STAT CG4 LACTIC ACID, ED    EKG None  Radiology No results found.  Procedures Procedures (including critical care time)  Medications Ordered in ED Medications  iopamidol (ISOVUE-370) 76 % injection (has no administration in time range)  iopamidol (ISOVUE-370) 76 % injection 100 mL (100 mLs Intravenous Contrast Given 02/27/18 2001)     Initial Impression / Assessment and Plan / ED Course  I have reviewed the triage vital signs and the nursing notes.  Pertinent labs & imaging results that were available during my care of the patient were reviewed by me and considered in my medical decision making (see chart for details).     Patient with chronic infections on wound.  Had bedside vascular surgery consult.  Dr. Donzetta Matters saw patient and recommended get angiogram with runoff.  States we do not need to wait for the results.  CT done and will discharge home.  Final Clinical Impressions(s) / ED Diagnoses   Final diagnoses:  Peripheral vascular disease [facility])    ED Discharge Orders    None       Davonna Belling, MD 02/27/18 2116

## 2018-02-27 NOTE — ED Notes (Signed)
Ice pack applied ,elevated at infiltrated site per radiologist order.

## 2018-02-27 NOTE — ED Notes (Addendum)
Kelly PA in room assessing pt. Manual BP 78/40 in right arm. This RN unable to doppler pulses in left foot.

## 2018-02-27 NOTE — Progress Notes (Signed)
Pt had 100 ml isovue 370 extravasation right forearm Dr watts assessed patient,iv was removed.arm was elevated ice was placed at site, verbal instructions were given to the patient,a verbal handoff was given to bobby rn Extravasation discharge orders were given.

## 2018-02-27 NOTE — ED Provider Notes (Signed)
Patient placed in Quick Look pathway, seen and evaluated   Chief Complaint: Possible arterial blockage  HPI:   52 year old female with extensive peripheral vascular disease presents for evaluation of a discolored left lower leg. PMH significant for PVD, PAF, chronic osteomyelitis, insulin dependent DM. Sent here by her wound care provider for evaluation of ischemic leg which has been worsening since her catheterization. She denies pain in the foot. She has chronic weakness and decreased sensation due to neuropathy.   ROS: +discolored foot  -fever  Physical Exam:   Gen: No distress, tearful at times  Neuro: Awake and Alert  Skin: Warm    Focused Exam: Left lower leg is discolored but foot is warm and dry. She has multiple chronic wounds over the lateral aspect of the left foot. DP pulse is not palpable. DP and PT pulses were not able to be obtained by doppler   Initiation of care has begun. The patient has been counseled on the process, plan, and necessity for staying for the completion/evaluation, and the remainder of the medical screening examination    Recardo Evangelist, PA-C 02/27/18 Donnellson, Nathan, MD 02/27/18 2215

## 2018-02-27 NOTE — Consult Note (Signed)
Hospital Consult    Reason for Consult:  Left foot wounds Referring Physician:  Dr. Alvino Chapel MRN #:  [id number]  History of Present Illness: This is a 52 y.o. female with history of recent aortobifemoral bypass along with bilateral femoropopliteal bypass graft thrombectomies.  She subsequently underwent reoperation for acute left lower extremity ischemia with endarterectomy of her TP trunk and saphenous vein patch.  This was all done for wounds of her bilateral lower extremities.  Since that time she has healed the right lateral ankle ulceration.  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  She was sent today from the wound care center [facility] for concern for worsening gangrene.  She has scheduled follow-up with Dr. Trula Slade next month with noninvasive vascular studies.  Currently she does not have any new complaints states that the pain in her leg is stable she has not had any fevers or other constitutional symptoms.  Past Medical History:  Diagnosis Date  . Arthritis   . Asthma   . Chronic back pain   . Depression   . Diabetes mellitus   . Diabetic neuropathy ([place])   . Family history of adverse reaction to anesthesia    mother had difficlty waking   . 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 GERD (gastroesophageal reflux disease)   . Hyperlipidemia   . Osteomyelitis of right fibula ([place]) 03/05/2017  . PAD (peripheral artery disease) ([place])   . Paroxysmal atrial fibrillation with rapid ventricular response ([place]) 12/02/2017  . Ulcer    Foot    Past Surgical History:  Procedure Laterality Date  . ABDOMINAL AORTAGRAM  June 15, 2014  . ABDOMINAL AORTAGRAM N/A 06/15/2014   Procedure: ABDOMINAL Maxcine Ham;  Surgeon: Serafina Mitchell, MD;  [facility] CATH LAB;  Service: Cardiovascular;  Laterality: N/A;  . ABDOMINAL AORTAGRAM N/A 11/22/2014   Procedure: ABDOMINAL AORTAGRAM;  Surgeon: Serafina Mitchell, MD;  Location: [facility]  CATH LAB;  Service: Cardiovascular;  Laterality: N/A;  . ABDOMINAL AORTOGRAM W/LOWER EXTREMITY N/A 01/07/2017   Procedure: Abdominal Aortogram w/Lower Extremity;  Surgeon: Serafina Mitchell, MD;  Location: [place] CV LAB;  Service: Cardiovascular;  Laterality: N/A;  . ABDOMINAL AORTOGRAM W/LOWER EXTREMITY N/A 10/31/2017   Procedure: ABDOMINAL AORTOGRAM W/LOWER EXTREMITY;  Surgeon: Elam Dutch, MD;  Location: [place] CV LAB;  Service: Cardiovascular;  Laterality: N/A;  . AORTA - BILATERAL FEMORAL ARTERY BYPASS GRAFT N/A 11/28/2017   Procedure: AORTA BIFEMORAL BYPASS USING HEMASHIELD GOLD GRAFT & REIMPLANT IMA;  Surgeon: Serafina Mitchell, MD;  Location: [facility] OR;  Service: Vascular;  Laterality: N/A;  . AORTIC ARCH ANGIOGRAPHY N/A 10/31/2017   Procedure: AORTIC ARCH ANGIOGRAPHY;  Surgeon: Elam Dutch, MD;  Location: [place] CV LAB;  Service: Cardiovascular;  Laterality: N/A;  . APPLICATION OF WOUND VAC  11/28/2017   Procedure: APPLICATION OF WOUND VAC;  Surgeon: Serafina Mitchell, MD;  Location: [facility] OR;  Service: Vascular;;  . ARTERIAL BYPASS SURGRY  07/05/2010   Right Common Femoral to below knee popliteal BPG  . BACK SURGERY     X's  2  . CARDIAC CATHETERIZATION    . CHOLECYSTECTOMY     Gall Bladder  . CYSTECTOMY Right    foot  . CYSTECTOMY Left    wrist  . EMBOLECTOMY Left 11/28/2017   Procedure: Left Lower Extremity Embolectomy, Left Tibial Peroneal Trunk Endarterectomy with Patch Angioplasty; Vein Harvest Small Saphenous Graft Left Lower  Leg;  Surgeon: [name], MD;  Location: [place];  Service: Vascular;  Laterality: Left;  . INTERCOSTAL NERVE BLOCK  November 2015  . INTRAOPERATIVE ARTERIOGRAM  11/28/2017   Procedure: INTRA OPERATIVE ARTERIOGRAM OF LEFT LEG;  Surgeon: Serafina Mitchell, MD;  Location: [place];  Service: Vascular;;  . IR FLUORO GUIDE CV LINE RIGHT  03/20/2017  . IR US GUIDE VASC ACCESS RIGHT  03/20/2017  . IRRIGATION AND DEBRIDEMENT BUTTOCKS Right  09/30/2016   Procedure: DEBRIDEMENT RIGHT  BUTTOCK WOUND;  Surgeon: Georganna Skeans, MD;  Location: [place];  Service: General;  Laterality: Right;  . left foot surgery    . left wrist cyst removal Left   . PERIPHERAL VASCULAR CATHETERIZATION N/A 05/07/2016   Procedure: Abdominal Aortogram;  Surgeon: Serafina Mitchell, MD;  Location: [place] CV LAB;  Service: Cardiovascular;  Laterality: N/A;  . PERIPHERAL VASCULAR CATHETERIZATION N/A 05/07/2016   Procedure: Lower Extremity Angiography;  Surgeon: Serafina Mitchell, MD;  Location: [name] CV LAB;  Service: Cardiovascular;  Laterality: N/A;  . PERIPHERAL VASCULAR CATHETERIZATION N/A 05/07/2016   Procedure: Aortic Arch Angiography;  Surgeon: Serafina Mitchell, MD;  Location: [place] CV LAB;  Service: Cardiovascular;  Laterality: N/A;  . PERIPHERAL VASCULAR CATHETERIZATION N/A 05/07/2016   Procedure: Upper Extremity Angiography;  Surgeon: Serafina Mitchell, MD;  Location: [place] CV LAB;  Service: Cardiovascular;  Laterality: N/A;  . PERIPHERAL VASCULAR CATHETERIZATION Right 05/07/2016   Procedure: Peripheral Vascular Balloon Angioplasty;  Surgeon: Serafina Mitchell, MD;  Location: [place] CV LAB;  Service: Cardiovascular;  Laterality: Right;  subclavian  . PERIPHERAL VASCULAR CATHETERIZATION Right 05/07/2016   Procedure: Peripheral Vascular Intervention;  Surgeon: Serafina Mitchell, MD;  Location: [place] CV LAB;  Service: Cardiovascular;  Laterality: Right;  External  Iliac  . SKIN GRAFT Right 2012   [name] by Dr. Nils Pyle- Right and Left Ankle  . SPINE SURGERY    . THROMBECTOMY FEMORAL ARTERY  11/28/2017   Procedure: THROMBECTOMY  & REVISION OF BILATERAL FEMORAL TO POPLETEAL ARTERIES;  Surgeon: Serafina Mitchell, MD;  Location: [facility] OR;  Service: Vascular;;  . TONSILLECTOMY      No Known Allergies  Prior to Admission medications   Medication Sig Start Date End Date Taking? Authorizing Provider  albuterol (PROVENTIL) 2 MG tablet Take 2 mg by  mouth 3 (three) times daily as needed for shortness of breath.     [provider]  cetirizine (ZYRTEC) 10 MG tablet Take 10 mg by mouth daily as needed for allergies.     [provider]  dicyclomine (BENTYL) 10 MG capsule Take 10 mg by mouth 4 (four) times daily as needed for spasms. 08/08/16 01/14/18  [provider]  doxycycline (VIBRA-TABS) 100 MG tablet Take 1 tablet (100 mg total) by mouth 2 (two) times daily. 01/26/18   [name], Lavell Islam, MD  HUMALOG KWIKPEN 100 UNIT/ML KiwkPen Inject 10 Units into the skin See admin instructions. 10 units three times a day before meals per sliding scale 11/11/15   [provider]  HYDROcodone-acetaminophen (NORCO) 10-325 MG tablet Take 1 tablet by mouth 2 (two) times daily.  01/07/18   [provider]  LANTUS SOLOSTAR 100 UNIT/ML Solostar Pen Inject 36 Units into the skin 2 (two) times daily.  09/15/14   [provider]  metoprolol tartrate (LOPRESSOR) 25 MG tablet Take 0.5 tablets (12.5 mg total) by mouth 2 (two) times daily. 01/14/18 04/14/18  Nelva Bush, MD  omeprazole (PRILOSEC) 40 MG capsule Take 40 mg by mouth daily. 11/06/16   [provider]  ondansetron (ZOFRAN ODT) 4 MG disintegrating tablet Take 1 tablet (4 mg total) by mouth every 8 (eight) hours as needed for nausea or vomiting. 12/22/17   Serafina Mitchell, MD  pregabalin (LYRICA) 100 MG capsule Limit 1 tab by mouth twice a day to 3 times a day if tolerated Patient taking differently: Take 100 mg by mouth 3 (three) times daily.  07/18/16   Mohammed Kindle, MD  rivaroxaban (XARELTO) 20 MG TABS tablet Take 1 tablet (20 mg total) by mouth daily with supper. 12/09/17   Ulyses Amor, PA-C  rosuvastatin (CRESTOR) 20 MG tablet Take 1 tablet (20 mg total) by mouth daily at 6 PM. 12/09/17   Ulyses Amor, PA-C    Social History   Socioeconomic History  . Marital status: Married    Spouse name: Not on file  . Number of children: Not on  file  . Years of education: Not on file  . Highest education level: Not on file  Occupational History  . Occupation: disabled  Social Needs  . Financial resource strain: Not on file  . Food insecurity:    Worry: Not on file    Inability: Not on file  . Transportation needs:    Medical: Not on file    Non-medical: Not on file  Tobacco Use  . Smoking status: Former Smoker    Years: 30.00    Types: Cigarettes    Last attempt to quit: 02/07/2017    Years since quitting: 1.0  . Smokeless tobacco: Never Used  Substance and Sexual Activity  . Alcohol use: No    Alcohol/week: 0.0 oz  . Drug use: No  . Sexual activity: Yes    Birth control/protection: Post-menopausal  Lifestyle  . Physical activity:    Days per week: Not on file    Minutes per session: Not on file  . Stress: Not on file  Relationships  . Social connections:    Talks on phone: Not on file    Gets together: Not on file    Attends religious service: Not on file    Active member of club or organization: Not on file    Attends meetings of clubs or organizations: Not on file    Relationship status: Not on file  . Intimate partner violence:    Fear of current or ex partner: Not on file    Emotionally abused: Not on file    Physically abused: Not on file    Forced sexual activity: Not on file  Other Topics Concern  . Not on file  Social History Narrative  . Not on file     Family History  Problem Relation Age of Onset  . Coronary artery disease Mother   . Peripheral vascular disease Mother   . Heart disease Mother        Before age 76  . Other Mother        Venous insuffiency  . Diabetes Mother   . Hyperlipidemia Mother   . Hypertension Mother   . Varicose Veins Mother   . Heart attack Mother        before age 64  . Heart disease Father   . Diabetes Father   . Diabetes Maternal Grandmother   . Diabetes Paternal Grandmother   . Diabetes Paternal Grandfather   . Diabetes Sister   . Hypertension  Sister   . Diabetes  Brother   . Hypertension Brother     ROS:   Cardiovascular: []  chest pain/pressure []  palpitations []  SOB lying flat []  DOE []  pain in legs while walking [x]  pain in legs at rest []  pain in legs at night [x]  non-healing ulcers []  hx of DVT [x]  swelling in legs  Pulmonary: []  productive cough []  asthma/wheezing []  home O2  Neurologic: []  weakness in []  arms []  legs []  numbness in []  arms []  legs []  hx of CVA []  mini stroke [] difficulty speaking or slurred speech []  temporary loss of vision in one eye []  dizziness  Hematologic: []  hx of cancer []  bleeding problems []  problems with blood clotting easily  Endocrine:   [x]  diabetes []  thyroid disease  GI []  vomiting blood []  blood in stool  GU: []  CKD/renal failure []  HD--[]  M/W/F or []  T/T/S []  burning with urination []  blood in urine  Psychiatric: []  anxiety []  depression  Musculoskeletal: []  arthritis []  joint pain  Integumentary: []  rashes [x]  ulcers  Constitutional: []  fever []  chills   Physical Examination  Vitals:   02/27/18 1636  BP: (!) 71/48  Pulse: 87  Resp: 16  Temp: 98 F (36.7 C)  SpO2: 97%   Body mass index is 26.31 kg/m.  General:  NAD HENT: WNL, normocephalic Pulmonary: normal non-labored breathing, without Rales, rhonchi,  wheezing Cardiac: She has palpable bilateral femoral pulses and a palpable right popliteal pulse. There are only venous signals in the left ankle although there is some signal in the left popliteal. Abdomen:  soft, NT/ND, no masses. Well healed midline incisions Musculoskeletal:      CBC    Component Value Date/Time   WBC 15.2 (H) 12/09/2017 0502   RBC 3.01 (L) 12/09/2017 0502   HGB 8.4 (L) 12/09/2017 0502   HCT 26.2 (L) 12/09/2017 0502   PLT 262 12/09/2017 0502   MCV 87.0 12/09/2017 0502   MCH 27.9 12/09/2017 0502   MCHC 32.1 12/09/2017 0502   RDW 15.2 12/09/2017 0502   LYMPHSABS 2,760 08/12/2017 1530   MONOABS  558 05/12/2017 1034   EOSABS 200 08/12/2017 1530   BASOSABS 37 08/12/2017 1530    BMET    Component Value Date/Time   NA 134 (L) 12/08/2017 1055   K 3.9 12/08/2017 1055   CL 99 (L) 12/08/2017 1055   CO2 25 12/08/2017 1055   GLUCOSE 276 (H) 12/08/2017 1055   BUN 11 12/08/2017 1055   CREATININE 0.76 12/08/2017 1055   CREATININE 1.24 (H) 08/12/2017 1530   CALCIUM 7.9 (L) 12/08/2017 1055   GFRNONAA >60 12/08/2017 1055   GFRNONAA 50 (L) 08/12/2017 1530   GFRAA >60 12/08/2017 1055   GFRAA 57 (L) 08/12/2017 1530    COAGS: Lab Results  Component Value Date   INR 1.40 11/28/2017   INR 1.30 11/28/2017   INR 1.18 11/24/2017      ASSESSMENT/PLAN: This is a 52 y.o. female s/p above mentioned vascular procedures. She is now here with concern for the wounds on her left foot.  Unfortunately I cannot find discernible arterial signals in her left leg below her palpable left femoral pulse.  This suggests that her aortobifemoral bypass is patent but that her left femoropopliteal bypass and distal patch angioplasty may have occluded along with her posterior tibial runoff.  We will get a CT angio in the ED and I will have her follow-up with Dr. Trula Slade in the next few weeks where the wounds can be checked and a plan can be  made.  She is a difficult candidate for angiogram given lack of pulses in her bilateral upper extremities as well as previous aortobifemoral bypass with scarring in both groins.  I have communicated the plan with her and she demonstrates good understanding and I will communicate to the office to get her set up for short interval follow-up.  Elynor Kallenberger C. Donzetta Matters, MD [facility] Office: [phone] Pager: [phone]

## 2018-03-01 NOTE — Progress Notes (Signed)
[name], [name] ([id number]) Visit Report for 02/27/2018 Chief Complaint Document Details Patient Name: [name], [name]. Date of Service: 02/27/2018 1:45 PM Medical Record Number: [id number] Patient Account Number: [id number] Date of Birth/Sex: March 12, 1966 (52 y.o. F) Treating RN: Montey Hora Primary Care Provider: Delight Stare Other Clinician: Referring Provider: Delight Stare Treating Provider/Extender: Melburn Hake, Flara Storti Weeks in Treatment: 10 Information Obtained from: Patient Chief Complaint She is here in follow up evaluation for multiple wounds Electronic Signature(s) Signed: 02/27/2018 2:46:43 PM By: Worthy Keeler PA-C Entered By: Worthy Keeler on 02/27/2018 13:54:06 [name], [name] ([id number]) -------------------------------------------------------------------------------- HPI Details Patient Name: Jamie Martyr. Date of Service: 02/27/2018 1:45 PM Medical Record Number: [id number] Patient Account Number: [id number] Date of Birth/Sex: 1966/01/31 (52 y.o. F) Treating RN: Montey Hora Primary Care Provider: Delight Stare Other Clinician: Referring Provider: Delight Stare Treating Provider/Extender: STONE III, Gwynne Kemnitz Weeks in Treatment: 10 History of Present Illness HPI Description: 52 year old patient was sent to [place] from [facility] where she was seen by Dr. Sherril Cong for a left ankle ulceration and was recently hospitalized with hypotension and sepsis. She was treated with IV antibiotics and has been scheduled to see Dr. Sharol Given. She was seen by vascular surgery who recommended a femoral bypass but surgery has been delayed until her sacral wound from last year's necrotizing fasciitis has healed. Was seeing the wound care team at [facility] but wanted to change over. She is a smoker and smokes a pack of cigarettes a day The patient was recently admitted in [place] between February 2 and February 14. She had a follow-up to see vascular surgery, orthopedic surgery and  infectious disease. 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 Review of the electronic medical records reveals that Dr. 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 An MRI showed edema in the bone which was consistent with osteomyelitis and the chronic ulcer and may need surgical intervention. She also had a sacral decubitus ulcer which was treated with the wound VAC. On 01/07/2017 she was taken up by the vascular surgeon Dr. Trula Slade for an abdominal aortogram and bilateral lower extremity runoff, for a history of having bilateral femoropopliteal bypass graft as well as external iliac stenting on the right and stenting of her bypass graft. She had developed a nonhealing wound on her right ankle and there was a possibility of a femoral occlusion. the findings were noted and the impression was a surgical revascularization with a aorto bifemoral bypass graft. Both the femoropopliteal bypass grafts were patent. 01/31/2017 -- x-ray of the right hip and pelvis -- IMPRESSION:No radiographic evidence of acute osteomyelitis. Normal-appearing right hip joint space for age. No acute bony abnormality of the hip. 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 Since seeing her last week she has not had an appointment with infectious disease or the orthopedic specialist yet. 02/06/2017 -- the patient missed a couple of appointments yesterday due to the weather but other than that has apparently given up smoking for the last 5  days. 02/13/2017 -- she has rescheduled her infectious disease  appointment and also the appointment with the orthopedic surgeon Dr. Sharol Given 03/06/2017 -- she was seen by Dr. Lucianne Lei dam of infectious disease on 03/05/2017 and he recommendedIV antibiotics but the patient did not want to have that and he has given her Augmentin and doxycycline and will reevaluate her in 2 months time. 03/13/2017 -- he saw Dr. Trula Slade regarding her vascular issues and he would like to wait to the sacral wound is completely closed before considering aortobifemoral bypass graft. He will see her back in 2 months time. She was also seen by Dr. Sharol Given of orthopedics who recommended continue wound care dressings and follow in the office in about 2 months time. he also recommended 3 view radiographs of the right ankle at follow-up. 03/28/2017 -- she did have a PICC line placed and Dr. Lucianne Lei dam has begun IV vancomycin and ceftriaxone. she is going to continue this until she sees him in approximately a month's time 04/18/2017 -- we had applied for a skin substitute for the patient's care but her copayment is going to be about $300 a piece and the patient will not be able to afford this. 05/08/17 on evaluation today patient appears to potentially have a fungal rash in the periwound region of the sacral area. This also seems to extend into the inguinal creases and factional region as well. She is tender to palpation with light rubbing over the region of the periwound and the skin in this region does have a beefy red appearance. Everything seems to point to a fungal infection at this time. She has been tolerating the dressing changes up to this point. There is no evidence of infection otherwise. 05/16/2017 -- was seen by Dr. Rhina Brackett dam of infectious disease -- he saw for the chronic wound over her right ankle Rancourt, [place]. ([id number]) with osteomyelitis of the fibula. He did not think that she is making progress and was not able  to examine her sacral decubitus ulcer as the patient would not allow it. She was switched to Bactrim DS 2 tablets twice a day since finishing her antibiotics. Her ESR was 76 and a CRP was 2.8. 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 05/23/17 on evaluation today patient's wound appears to be doing about the same in regard to the ankle as well as the gluteal area. She apparently has issues with the one back staying in place and she states the sill seems to break up the inferior aspect. She does have home help coming out to apply the wound VAC. She has been tolerating the dressing changes without any problem although she has had some increased pain in the gluteal region due to a fall she sustained and she feels like she brews that area and she had pain actually radiating down her leg which is slowly beginning to improve. There's no evidence of infection. 05/29/17 Unfortunately on evaluation today patient's wounds both in regard to the ankle as well as in regard to her gluteal region on the right appear to be measuring the same as last week's evaluation. She also tells me that she is having soreness today in regard to the right gluteal area because she had a visitor who came to see her a couple days ago and stayed for six hours and she had to set up for that entire length of time visiting. This has called some increased discomfort. Normally she does not  set up for those long periods of time. Fortunately there's no evidence of infection and no worsening of the wounds. 06/13/2017 -- the patient has had a lot of health issues including perfuse diarrhea and generalized weakness and I have instructed her to see her PCP as soon as possible and if things get worse to go to the ER. She has not brought her wound VAC today. She continues to be on oral antibiotics 06/30/17- she is here in follow up evaluation  for right ischial and right lateral malleolus ulcer. She continues with negative pressure wound therapy to the ischial wound. She is currently on a diabetic therapy per infectious disease, although she does state that she inconsistently takes it. She was advised to take her antibiotic as prescribed 07/11/2017 -- she has been unable to use a wound VAC regularly because she loses this evening and it has not been possible for her to use it. 07/18/2017 -- she is much more comfortable and looks better without the wound VAC and at this stage I believe it will be better for her to return the machine and continue with local care. 08/07/2017 -- she is back up to 3 weeks due to a vacation and during this time her wound VAC was returned. She has been doing local dressings 08/15/17 on evaluation today patient appears to be doing about the same in regard to her ankle as well as right gluteal wound. She has been tolerating the dressing changes and tells me that it's mainly her gluteal wound that hurts the ankle is nontender to palpation. She rates this pain to be a one out of 10 at rest and with cleansing of the wound five out of 10 at least. No fevers, chills, nausea, or vomiting noted at this time. She does note that home health has told her to inquire about possibly using silver nitrate on the wound edges due to the road nature of the edges. 08/29/2017 -- -- I got a note which Dr. Lucianne Lei dam sent to Dr. 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 He had recommended definitive surgery to fix this. He recommended to continue with Bactrim Right ankle x-ray -- IMPRESSION: Findings compatible with cellulitis of the lateral malleolar region. A small focus of bony destruction involving the tip of the lateral malleolus is worrisome for osteomyelitis. 09/05/2017 -- the patient was seen by Dr. Meridee Score yesterday who thought her wound looked  good and he would continue local care with silver alginate dressing and follow-up in 4 weeks. He did not recommend any surgical intervention of the ankle until there is good arterial flow and he would await Dr. Stephens Shire opinion regarding this. She was also seen by infectious disease Dr. Lucianne Lei dam, who was concerned about her elevated potassium and took her off Bactrim. he has recommended doxycycline. 09/26/2017 -- she is back after 3 weeks as she was out of town on vacation. She has put on some weight but other than that no change in her health 10/24/2017 -- she was seen by Dr. [name] -- after a thorough review he plans a angiographically via the left femoral approach to evaluate her left subclavian artery and address any stenosis at that time. He will also perform bilateral runoff to confirm that a right femoropopliteal as well as a left femoropopliteal bypass graft remained patent. He is then considering a right axillary to femoral bypass graft for limb salvage. ADALEEN, HULGAN ([id number]) ==== 12/18/17-she  is here in follow-up evaluation for multiple wounds. She is s/p aortobifemoral and left fem-pop revision. She presents with multiple necrotic areas to the left toes and lateral foot. The wounds we were originally seen her for (right heel and left buttock) are improved/stable. According to her husband the wounds to the left foot are "better" than they were upon discharge from the hospital last Tuesday. He states that Dr. Trula Slade saw her on same day of discharge. She has a follow-up with Dr. Trula Slade on Monday. There are dopplerable pulses to the left dorsalis pedis, posterior tibial, peroneal. We communicated with Dr. Stephens Shire nurse regarding today's findings, photos were sent to her email and the patient will maintain her appointment on Monday. We discussed with the patient and her husband to report to [facility] in [place] for any concerns, changes, new areas of necrosis, new  areas or progressive ischemia/purple discoloration, pallor, new/increased pain. We will anticipate follow up next week 12/30/17 patient unfortunately continues to have multiple wound of her lower extremities and specifically her left toes and lateral foot. She does have regular follow-up visits with Dr. Trula Slade who performed her surgery and at this point they are just holding to wait and see what needs to be done in regard to her foot such as what's going to heal and what may need to have potential amputation of further surgery. For this reason we are just trying to maintain and see were things end up at this point. Fortunately patient is not having any significant discomfort. The right lateral malleolus appears to be doing about the same there is some granulation tissue she does have osteomyelitis and not ulcer. 01/06/18 patient presents today for fault evaluation concerning her ongoing issues with her right lateral ankle, right gluteal region, and left lower extremity. Currently regard to left lower extremity it's possible that she may require some amputation although patient states she has the appointment follow-up with Dr. Trula Slade next week. That's when she will find out what he thinks needs to be done at that point. Nonetheless in regard to the right lateral ankle this appears to be doing fairly well today. Her right gluteal region actually appears to be doing fairly well with a good wound surface any slough that was present easily cleaned off with saline and gauze. 01/16/18 on evaluation today patient appears to be doing much better actually in regard to her left foot necrotic areas. A lot of this is starting to liftoff as far as the eschar is concerned and there is a lot of healing underneath which is excellent news. I'm actually pleasantly pleased with the results that she's getting in this regard. Fortunately there does not appear to be any evidence of infection which is great news. Overall she  has been tolerating the dressing changes without complication she does have one new ulcer on the right anterior shin due to a small skin tear which is actually healed but then the adhesive from a Band-Aid that was put on it at a doctor's office actually has 20 skin and she says while he has a small ulcer due to that. Fortunately this is superficial. 01/30/18 on evaluation today patient appears to be doing rather well in regard to her left lower extremity ulcerations as well as her right lateral malleolus and gluteal region. She has been tolerating the dressing changes in general without complication. She did see Dr. Trula Slade her vascular surgeon since I last saw her in the good news is he does not really feel  like he's gonna have to perform anything surgical in regard to deputation. With that being said I am still somewhat concerned about her left fifth toe we will have to see. Nonetheless overall I do believe she's making progress and her right lateral malleolus appears to have at the allies to over at this point the question will be if this stays covered over or not. 02/13/18 on evaluation today patient continues to do rather well in regard to her ulcerations. Everything seems to be doing better her right lateral ankle is still open but just barely. Overall I have been happy with the progress. Patient also is happy she states even before I saw it and said anything she Artie knew it was better just from what she can feel. 02/27/18 on evaluation today patient presents for follow-up concerning her left lower extremity ulcer. She has been tolerating the dressing changes without complication she performs a lot of these on her own although she's not able to really visualize the wound bed due to the fact that she is legally blind. She can see some things movement wise but not much. With that being said this really does not look to be as good as when I saw her two weeks ago on the 29th. We had been slowly  seeing the progress in regard to the eschar regions which were starting to lift up with the Betadine and doing great. Today I'm not seen as much of that and specifically around the great toe on the lateral aspect there appears to be some pus on the line and surrounding the eschar. She also has the same thing noted along with mottled skin in the region of the lateral fifth toe. Unfortunately these digits are also somewhat cyanotic at this point and cool to touch. Her leg is erythematous from around mid calf down to the foot as well. All of this is new since last evaluation two weeks ago. Patient really does not have any discomfort and really notes nothing has changed since her last visit in that regard. She continues to perform a lot of the dressing changes herself at this time. Overall I have been extremely pleased with how things have gone up into this point since her surgery. She does see Dr. Trula Slade on a regular basis on his last check with her things were going well. Electronic Signature(s) Signed: 02/27/2018 2:46:43 PM By: Wyatt Haste, Merrillyn S. ([id number]) Entered By: Worthy Keeler on 02/27/2018 14:41:49 Trenkamp, [name] ([id number]) -------------------------------------------------------------------------------- Physical Exam Details Patient Name: YOSELINE, ANDERSSON S. Date of Service: 02/27/2018 1:45 PM Medical Record Number: [id number] Patient Account Number: [id number] Date of Birth/Sex: [birth year] (52 y.o. F) Treating RN: Montey Hora Primary Care Provider: Delight Stare Other Clinician: Referring Provider: Delight Stare Treating Provider/Extender: STONE III, Itzae Miralles Weeks in Treatment: 10 Constitutional Well-nourished and well-hydrated in no acute distress. Respiratory normal breathing without difficulty. clear to auscultation bilaterally. Cardiovascular regular rate and rhythm with normal S1, S2. Absent posterior tibial and dorsalis pedis pulses left lower extremities.  Erythma noted of the left lower leg from the calf mid way down to her foot.. Musculoskeletal Patient unable to walk without assistance. Psychiatric this patient is able to make decisions and demonstrates good insight into disease process. Alert and Oriented x 3. pleasant and cooperative. Notes During evaluation today the two things I noted were that number one she had cyanosis and what appears to be some pus buildup around the eschar for great toe and that her fifth  toe appeared to be worse, cyanotic, with potential pus buildup underneath as well. I'm concerned that this has deteriorated quite a bit since I last saw her. She has no fevers or chills at this point in time no evidence of sepsis. Electronic Signature(s) Signed: 02/27/2018 2:46:43 PM By: Worthy Keeler PA-C Entered By: Worthy Keeler on 02/27/2018 14:43:52 Behnke, [name] ([id number]) -------------------------------------------------------------------------------- Physician Orders Details Patient Name: DEB, LOUDIN. Date of Service: 02/27/2018 1:45 PM Medical Record Number: [id number] Patient Account Number: [id number] Date of Birth/Sex: 23-Dec-1965 (53 y.o. 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 Wound #3 Right,Lateral Malleolus o Clean wound with Normal Saline. Wound #5 Left,Circumferential Toe Great o Clean wound with Normal Saline. Wound #6 Left,Circumferential Toe Fourth o Clean wound with Normal Saline. Wound #7 Right Gluteus o Clean wound with Normal Saline. Wound #8 Left,Lateral Foot o Clean wound with Normal Saline. Anesthetic (add to Medication List) Wound #7 Right Gluteus o Topical Lidocaine 4% cream applied to wound bed prior to debridement (In Clinic Only). Skin Barriers/Peri-Wound Care Wound #10 Left,Circumferential Toe Fifth o Skin Prep Wound #3 Right,Lateral Malleolus o Skin Prep Wound #5 Left,Circumferential Toe Great o Skin Prep Wound #6 Left,Circumferential Toe Fourth Moxon, Mery S. ([id number]) o Skin Prep Wound #7 Right Gluteus o Skin Prep Wound #8 Left,Lateral Foot o Skin Prep Primary Wound Dressing Wound #10 Left,Circumferential Toe Fifth o Other: - betadine or providone-iodine (paint on wound) - silver alginate between 4th and 5th toes on left foot Wound #3 Right,Lateral Malleolus o Foam Wound #5 Left,Circumferential Toe Great o Other: - betadine or providone-iodine (paint on wound) - silver alginate between 4th and 5th toes on left foot Wound #6 Left,Circumferential Toe Fourth o Other: - betadine or providone-iodine (paint on wound) - silver alginate between 4th and 5th toes on left foot Wound #7 Right Gluteus o Silver Collagen o Drawtex Wound #8 Left,Lateral Foot o Other: - betadine or providone-iodine (paint on wound) - silver alginate between 4th and 5th toes on left foot Secondary Dressing Wound #10 Left,Circumferential Toe Fifth o ABD pad o Conform/Kerlix Wound #3 Right,Lateral Malleolus o Boardered Foam Dressing Wound #5 Left,Circumferential Toe Great o ABD pad o Conform/Kerlix Wound #6 Left,Circumferential Toe Fourth o ABD  pad o Conform/Kerlix Wound #7 Right Gluteus o Boardered Foam Dressing Wound #8 Left,Lateral Foot o ABD pad o Conform/Kerlix Dressing Change Frequency Wound #10 Left,Circumferential Toe Fifth o Change dressing every other day. o Other: - PRN [name], Gricelda S. ([id number]) Wound #3 Right,Lateral Malleolus o Change dressing every other day. o Other: - PRN Wound #5 Left,Circumferential Toe Great o Change dressing every other day. o Other: - PRN Wound #6 Left,Circumferential Toe Fourth o Change dressing every other day. o Other: - PRN Wound #7 Right Gluteus o Change dressing every other day. o Other: - PRN Wound #8 Left,Lateral Foot o Change dressing  every other day. o Other: - PRN Follow-up Appointments Wound #10 Left,Circumferential Toe Fifth o Return Appointment in 1 week. Wound #3 Right,Lateral Malleolus o Return Appointment in 1 week. Wound #5 Left,Circumferential Toe Great o Return Appointment in 1 week. Wound #6 Left,Circumferential Toe Fourth o Return Appointment in 1 week. Wound #7 Right Gluteus o Return Appointment in 1 week. Wound #8 Left,Lateral Foot o Return Appointment in 1 week. Off-Loading Wound #7 Right Gluteus o Turn and reposition every 2 hours Additional Orders / Instructions Wound #10 Left,Circumferential Toe Fifth o Increase protein intake. Wound #3 Right,Lateral Malleolus o Increase protein intake. Wound #5 Left,Circumferential Toe Great o Increase protein intake. Wound #6 Left,Circumferential Toe Fourth o Increase protein intake. SHARIN, ALTIDOR. ([id number]) Wound #7 Right Gluteus o Increase protein intake. Wound #8 Left,Lateral Foot o Increase protein intake. Home Health Wound #10 Left,Circumferential Toe Towner Nurse may visit PRN to address patientos wound care needs. o FACE TO FACE ENCOUNTER: MEDICARE and MEDICAID PATIENTS: I  certify that this patient is under my care and that I had a face-to-face encounter that meets the physician face-to-face encounter requirements with this patient on this date. The encounter with the patient was in whole or in part for the following MEDICAL CONDITION: (primary reason for Artois) MEDICAL NECESSITY: I certify, that based on my findings, NURSING services are a medically necessary home health service. HOME BOUND STATUS: I certify that my clinical findings support that this patient is homebound (i.e., Due to illness or injury, pt requires aid of supportive devices such as crutches, cane, wheelchairs, walkers, the use of special transportation or the assistance of another person to leave their place of residence. There is a normal inability to leave the home and doing so requires considerable and taxing effort. Other absences are for medical reasons / religious services and are infrequent or of short duration when for other reasons). o If current dressing causes regression in wound condition, may D/C ordered dressing product/s and apply Normal Saline Moist Dressing daily until next [place] / Other MD appointment. [place] of regression in wound condition at [phone]. o Please direct any NON-WOUND related issues/requests for orders to patient's Primary Care Physician Wound #3 [place] Nurse may visit PRN to address patientos wound care needs. o FACE TO FACE ENCOUNTER: MEDICARE and MEDICAID PATIENTS: I certify that this patient is under my care and that I had a face-to-face encounter that meets the physician face-to-face encounter requirements with this patient on this date. The encounter with the patient was in whole or in part for the following MEDICAL CONDITION: (primary reason for [place]) MEDICAL NECESSITY: I certify, that based on my findings, NURSING services  are a medically necessary home health service. HOME BOUND STATUS: I certify that my clinical findings support that this patient is homebound (i.e., Due to illness or injury, pt requires aid of supportive devices such as crutches, cane, wheelchairs, walkers, the use of special transportation or the assistance of another person to leave their place of residence. There is a normal inability to leave the home and doing so requires considerable and taxing effort. Other absences are for medical reasons / religious services and are infrequent or of short duration when for other reasons). o If current dressing causes regression in wound condition, may D/C ordered dressing product/s and apply Normal Saline Moist Dressing daily until next [facility] / Other MD appointment. [place] of regression in wound condition at [phone]. o Please direct any NON-WOUND related issues/requests for orders to patient's Primary Care Physician Wound #5 Left,Circumferential Toe [place] Nurse may visit PRN to address patientos wound care needs. o FACE TO FACE ENCOUNTER: MEDICARE and MEDICAID PATIENTS: I certify that this patient is under my care and that I had a face-to-face encounter that meets the physician face-to-face encounter requirements with this patient on this date. The encounter with the patient was in whole or in part for the following MEDICAL CONDITION: (primary reason for [place]) MEDICAL NECESSITY: I certify, that based on my findings, NURSING services are a medically necessary home health service. HOME BOUND STATUS: I certify that my clinical findings support that this patient is homebound (i.e., Due to illness or injury, pt requires aid of supportive devices such as crutches, cane, wheelchairs, walkers, the use of special transportation or the assistance of another person to leave their place of residence. There is a  normal inability to leave the home and doing so requires considerable and taxing effort. Other absences are for medical reasons / religious services and are infrequent or of short duration when for other reasons). [name], [name] ([id number]) o If current dressing causes regression in wound condition, may D/C ordered dressing product/s and apply Normal Saline Moist Dressing daily until next [place] / Other MD appointment. [place] of regression in wound condition at [phone]. o Please direct any NON-WOUND related issues/requests for orders to patient's Primary Care Physician Wound #6 Left,Circumferential Toe [place] Nurse may visit PRN to address patientos wound care needs. o FACE TO FACE ENCOUNTER: MEDICARE and MEDICAID PATIENTS: I certify that this patient is under my care and that I had a face-to-face encounter that meets the physician face-to-face encounter requirements with this patient on this date. The encounter with the patient was in whole or in part for the following MEDICAL CONDITION: (primary reason for [place]) MEDICAL NECESSITY: I certify, that based on my findings, NURSING services are a medically necessary home health service. HOME BOUND STATUS: I certify that my clinical findings support that this patient is homebound (i.e., Due to illness or injury, pt requires aid of supportive devices such as crutches, cane, wheelchairs, walkers, the use of special transportation or the assistance of another person to leave their place of residence. There is a normal inability to leave the home and doing so requires considerable and taxing effort. Other absences are for medical reasons / religious services and are infrequent or of short duration when for other reasons). o If current dressing causes regression in wound condition, may D/C ordered dressing product/s and apply Normal Saline Moist  Dressing daily until next [place] / Other MD appointment. [place] of regression in wound condition at [phone]. o Please direct any NON-WOUND related issues/requests for orders to patient's Primary Care Physician Wound #7 Right [place] Nurse may visit PRN to address patientos wound care needs. o FACE TO FACE ENCOUNTER: MEDICARE and MEDICAID PATIENTS: I certify that this patient is under my care and that I had a face-to-face encounter that meets the physician face-to-face encounter requirements with this patient on this date. The encounter with the patient was in whole or in part for the following MEDICAL CONDITION: (primary reason for Home  Healthcare) MEDICAL NECESSITY: I certify, that based on my findings, NURSING services are a medically necessary home health service. HOME BOUND STATUS: I certify that my clinical findings support that this patient is homebound (i.e., Due to illness or injury, pt requires aid of supportive devices such as crutches, cane, wheelchairs, walkers, the use of special transportation or the assistance of another person to leave their place of residence. There is a normal inability to leave the home and doing so requires considerable and taxing effort. Other absences are for medical reasons / religious services and are infrequent or of short duration when for other reasons). o If current dressing causes regression in wound condition, may D/C ordered dressing product/s and apply Normal Saline Moist Dressing daily until next [place] / Other MD appointment. [place] of regression in wound condition at [phone]. o Please direct any NON-WOUND related issues/requests for orders to patient's Primary Care Physician Wound #8 [place] Visits o Home Health Nurse may visit PRN to address patientos wound care  needs. o FACE TO FACE ENCOUNTER: MEDICARE and MEDICAID PATIENTS: I certify that this patient is under my care and that I had a face-to-face encounter that meets the physician face-to-face encounter requirements with this patient on this date. The encounter with the patient was in whole or in part for the following MEDICAL CONDITION: (primary reason for [employer]) MEDICAL NECESSITY: I certify, that based on my findings, NURSING services are a medically necessary home health service. HOME BOUND STATUS: I certify that my clinical findings support that this patient is homebound (i.e., Due to illness or injury, pt requires aid of supportive devices such as crutches, cane, wheelchairs, walkers, the use of special transportation or the assistance of another person to leave their place of residence. There is a normal inability to leave the home and doing so requires considerable and taxing effort. Other absences are for medical reasons / religious services and are infrequent or of short duration when for other reasons). o If current dressing causes regression in wound condition, may D/C ordered dressing product/s and apply Normal Saline Moist Dressing daily until next [place] / Other MD appointment. [place] of regression in wound condition at [phone]. o Please direct any NON-WOUND related issues/requests for orders to patient's Primary Care Physician NOVALYN, LAJARA ([id number]) Electronic Signature(s) Signed: 02/27/2018 2:46:43 PM By: Worthy Keeler PA-C Signed: 02/27/2018 4:45:51 PM By: Montey Hora Entered By: Montey Hora on 02/27/2018 14:24:26 Elsen, [name] ([id number]) -------------------------------------------------------------------------------- Problem List Details Patient Name: [name], [name]. Date of Service: 02/27/2018 1:45 PM Medical Record Number: [id number] Patient Account Number: [id number] Date of Birth/Sex: October 15, 1966 (52 y.o.  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 Date of Service: 02/27/2018 1:45 PM Medical Record Number: [id number] Patient Account Number: [id number] Date of Birth/Sex: Sep 16, 1966 (52 y.o. F) Treating RN: Montey Hora Primary Care Provider: Delight Stare Other Clinician: Referring Provider: Delight Stare Treating Provider/Extender: Melburn Hake, Zadyn Yardley Weeks in Treatment: 10 Subjective Chief Complaint Information obtained from Patient She is here in follow up evaluation for multiple wounds History of Present Illness (HPI) 52 year old patient was sent to [place] from [facility] where she was seen by Dr. Sherril Cong for a left ankle ulceration and was recently hospitalized with hypotension and sepsis. She was treated with IV antibiotics and has been scheduled to see Dr. Sharol Given. She  was seen by vascular surgery who recommended a femoral bypass but surgery has been delayed until her sacral wound from last year's necrotizing fasciitis has healed. Was seeing the wound care team at [facility] but wanted to change over. She is a smoker and smokes a pack of cigarettes a day The patient was recently admitted in [place] between February 2 and February 14. She had a follow-up to see vascular surgery, orthopedic surgery and infectious disease. 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 Review of the electronic medical records reveals that Dr. 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 An MRI showed edema in the bone which was consistent with osteomyelitis and the chronic ulcer and may need surgical intervention. She also had a sacral decubitus ulcer which was treated with the wound VAC. On 01/07/2017 she was taken up by the vascular surgeon Dr. Trula Slade for an abdominal aortogram and bilateral lower extremity runoff, for a history of having bilateral femoropopliteal bypass graft as well as external iliac stenting on the right and stenting of her bypass graft. She had developed a nonhealing wound on her right ankle and there was a possibility of a femoral occlusion. the findings were noted and the impression was a surgical revascularization with a aorto bifemoral bypass graft. Both the femoropopliteal bypass grafts were patent. 01/31/2017 -- x-ray of the right hip and pelvis -- IMPRESSION:No radiographic evidence of acute osteomyelitis. Normal-appearing right hip  joint space for age. No acute bony abnormality of the hip. 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 Since seeing her last week she has not had an appointment with infectious disease or the orthopedic specialist yet. 02/06/2017 -- the patient missed a couple of appointments yesterday due to the weather but other than that has apparently given up smoking for the last 5 days. 02/13/2017 -- she has rescheduled her infectious disease appointment and also the appointment with the orthopedic surgeon Dr. Sharol Given 03/06/2017 -- she was seen by Dr. Lucianne Lei dam of infectious disease on 03/05/2017 and he recommendedIV antibiotics but the patient did not want to have that and he has given her Augmentin and doxycycline and will reevaluate her in 2 months time. 03/13/2017 -- he saw Dr. Trula Slade regarding her vascular issues and he would like to wait to the sacral wound is completely  closed before considering aortobifemoral bypass graft. He will see her back in 2 months time. She was also seen by Dr. Sharol Given of orthopedics who recommended continue wound care dressings and follow in the office in about 2 months time. he also recommended 3 view radiographs of the right ankle at follow-up. 03/28/2017 -- she did have a PICC line placed and Dr. Lucianne Lei dam has begun IV vancomycin and ceftriaxone. she is going to continue this until she sees him in approximately a month's time 04/18/2017 -- we had applied for a skin substitute for the patient's care but her copayment is going to be about $300 a piece and the patient will not be able to afford this. 05/08/17 on evaluation today patient appears to potentially have a fungal rash in the periwound region of the sacral area. This Uptain, Seleste S. ([id number]) also seems to extend into the inguinal creases and factional region as well. She is tender to palpation with light rubbing over the region of the periwound and the skin in this  region does have a beefy red appearance. Everything seems to point to a fungal infection at this time. She has been tolerating the dressing changes up to this point. There is no evidence of infection otherwise. 05/16/2017 -- was seen by Dr. 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 He did not think that she is making progress and was not able to examine her sacral decubitus ulcer as the patient would not allow it. She was switched to Bactrim DS 2 tablets twice a day since finishing her antibiotics. Her ESR was 76 and a CRP was 2.8. 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 05/23/17 on evaluation today patient's wound appears to be doing about the same in regard to the ankle as well as the gluteal area. She apparently has issues with the one back staying in place and she states the sill seems to break up the inferior aspect. She does have home help coming out to apply the wound VAC. She has been tolerating the dressing changes without any problem although she has had some increased pain in the gluteal region due to a fall she sustained and she feels like she brews that area and she had pain actually radiating down her leg which is slowly beginning to improve. There's no evidence of infection. 05/29/17 Unfortunately on evaluation today patient's wounds both in regard to the ankle as well as in regard to her gluteal region on the right appear to be measuring the same as last week's evaluation. She also tells me that she is having soreness today in regard to the right gluteal area because she had a visitor who came to see her a couple days ago and stayed for six hours and she had to set up for that entire length of time visiting. This has called some increased discomfort. Normally she does not set up for those long  periods of time. Fortunately there's no evidence of infection and no worsening of the wounds. 06/13/2017 -- the patient has had a lot of health issues including perfuse diarrhea and generalized weakness and I have instructed her to see her PCP as soon as possible and if things get worse to go to the ER. She has not brought her wound VAC today. She continues to be on oral antibiotics  06/30/17- she is here in follow up evaluation for right ischial and right lateral malleolus ulcer. She continues with negative pressure wound therapy to the ischial wound. She is currently on a diabetic therapy per infectious disease, although she does state that she inconsistently takes it. She was advised to take her antibiotic as prescribed 07/11/2017 -- she has been unable to use a wound VAC regularly because she loses this evening and it has not been possible for her to use it. 07/18/2017 -- she is much more comfortable and looks better without the wound VAC and at this stage I believe it will be better for her to return the machine and continue with local care. 08/07/2017 -- she is back up to 3 weeks due to a vacation and during this time her wound VAC was returned. She has been doing local dressings 08/15/17 on evaluation today patient appears to be doing about the same in regard to her ankle as well as right gluteal wound. She has been tolerating the dressing changes and tells me that it's mainly her gluteal wound that hurts the ankle is nontender to palpation. She rates this pain to be a one out of 10 at rest and with cleansing of the wound five out of 10 at least. No fevers, chills, nausea, or vomiting noted at this time. She does note that home health has told her to inquire about possibly using silver nitrate on the wound edges due to the road nature of the edges. 08/29/2017 -- -- I got a note which Dr. Lucianne Lei dam sent to Dr. Sharol Given regarding plain film showing osteomyelitis in the lateral malleolus consistent  with osteomyelitis which was worrisome. He had recommended definitive surgery to fix this. He recommended to continue with Bactrim Right ankle x-ray -- IMPRESSION: Findings compatible with cellulitis of the lateral malleolar region. A small focus of bony destruction involving the tip of the lateral malleolus is worrisome for osteomyelitis. 09/05/2017 -- the patient was seen by Dr. Meridee Score yesterday who thought her wound looked good and he would continue local care with silver alginate dressing and follow-up in 4 weeks. He did not recommend any surgical intervention of the ankle until there is good arterial flow and he would await Dr. Stephens Shire opinion regarding this. She was also seen by infectious disease Dr. Lucianne Lei dam, who was concerned about her elevated potassium and took her off Bactrim. he has recommended doxycycline. 09/26/2017 -- she is back after 3 weeks as she was out of town on vacation. She has put on some weight but other than that [name], [name]. ([id number]) no change in her health 10/24/2017 -- she was seen by Dr. [name] -- after a thorough review he plans a angiographically via the left femoral approach to evaluate her left subclavian artery and address any stenosis at that time. He will also perform bilateral runoff to confirm that a right femoropopliteal as well as a left femoropopliteal bypass graft remained patent. He is then considering a right axillary to femoral bypass graft for limb salvage. ==== 12/18/17-she is here in follow-up evaluation for multiple wounds. She is s/p aortobifemoral and left fem-pop revision. She presents with multiple necrotic areas to the left toes and lateral foot. The wounds we were originally seen her for (right heel and left buttock) are improved/stable. According to her husband the wounds to the left foot are "better" than they were upon discharge from the hospital last Tuesday. He states that Dr. Trula Slade saw her on same day  of  discharge. She has a follow-up with Dr. Trula Slade on Monday. There are dopplerable pulses to the left dorsalis pedis, posterior tibial, peroneal. We communicated with Dr. Stephens Shire nurse regarding today's findings, photos were sent to her email and the patient will maintain her appointment on Monday. We discussed with the patient and her husband to report to [facility] in [place] for any concerns, changes, new areas of necrosis, new areas or progressive ischemia/purple discoloration, pallor, new/increased pain. We will anticipate follow up next week 12/30/17 patient unfortunately continues to have multiple wound of her lower extremities and specifically her left toes and lateral foot. She does have regular follow-up visits with Dr. Trula Slade who performed her surgery and at this point they are just holding to wait and see what needs to be done in regard to her foot such as what's going to heal and what may need to have potential amputation of further surgery. For this reason we are just trying to maintain and see were things end up at this point. Fortunately patient is not having any significant discomfort. The right lateral malleolus appears to be doing about the same there is some granulation tissue she does have osteomyelitis and not ulcer. 01/06/18 patient presents today for fault evaluation concerning her ongoing issues with her right lateral ankle, right gluteal region, and left lower extremity. Currently regard to left lower extremity it's possible that she may require some amputation although patient states she has the appointment follow-up with Dr. Trula Slade next week. That's when she will find out what he thinks needs to be done at that point. Nonetheless in regard to the right lateral ankle this appears to be doing fairly well today. Her right gluteal region actually appears to be doing fairly well with a good wound surface any slough that was present easily cleaned off with saline and  gauze. 01/16/18 on evaluation today patient appears to be doing much better actually in regard to her left foot necrotic areas. A lot of this is starting to liftoff as far as the eschar is concerned and there is a lot of healing underneath which is excellent news. I'm actually pleasantly pleased with the results that she's getting in this regard. Fortunately there does not appear to be any evidence of infection which is great news. Overall she has been tolerating the dressing changes without complication she does have one new ulcer on the right anterior shin due to a small skin tear which is actually healed but then the adhesive from a Band-Aid that was put on it at a doctor's office actually has 20 skin and she says while he has a small ulcer due to that. Fortunately this is superficial. 01/30/18 on evaluation today patient appears to be doing rather well in regard to her left lower extremity ulcerations as well as her right lateral malleolus and gluteal region. She has been tolerating the dressing changes in general without complication. She did see Dr. Trula Slade her vascular surgeon since I last saw her in the good news is he does not really feel like he's gonna have to perform anything surgical in regard to deputation. With that being said I am still somewhat concerned about her left fifth toe we will have to see. Nonetheless overall I do believe she's making progress and her right lateral malleolus appears to have at the allies to over at this point the question will be if this stays covered over or not. 02/13/18 on evaluation today patient continues to do rather well  in regard to her ulcerations. Everything seems to be doing better her right lateral ankle is still open but just barely. Overall I have been happy with the progress. Patient also is happy she states even before I saw it and said anything she Artie knew it was better just from what she can feel. 02/27/18 on evaluation today patient  presents for follow-up concerning her left lower extremity ulcer. She has been tolerating the dressing changes without complication she performs a lot of these on her own although she's not able to really visualize the wound bed due to the fact that she is legally blind. She can see some things movement wise but not much. With that being said this really does not look to be as good as when I saw her two weeks ago on the 29th. We had been slowly seeing the progress in regard to the eschar regions which were starting to lift up with the Betadine and doing great. Today I'm not seen as much of that and specifically around the great toe on the lateral aspect there appears to be some pus on the line and surrounding the eschar. She also has the same thing noted along with mottled skin in the region of the lateral fifth toe. Unfortunately these digits are also somewhat cyanotic at this point and cool to touch. Her leg is erythematous from around mid calf down to the foot as well. All of this is new since last evaluation two weeks ago. Patient really does not have any discomfort and really notes nothing has changed since her last visit in that regard. She continues to perform a lot of the ROZALYNN, BUEGE. ([id number]) dressing changes herself at this time. Overall I have been extremely pleased with how things have gone up into this point since her surgery. She does see Dr. Trula Slade on a regular basis on his last check with her things were going well. Patient History Information obtained from Patient. Family History Diabetes - Mother,Father,Maternal Grandparents,Paternal Grandparents,Child, Heart Disease - [place] Grandparents, Hereditary Spherocytosis - Mother, Hypertension - Mother,Siblings,Paternal Grandparents,Maternal Grandparents, Seizures - Maternal Grandparents,Paternal Grandparents, Thyroid Problems - Siblings, No family history of Cancer, Kidney Disease, Lung  Disease, Stroke, Tuberculosis. Social History Current every day smoker, Marital Status - Married, Alcohol Use - Rarely, Drug Use - No History, Caffeine Use - Moderate. Review of Systems (ROS) Constitutional Symptoms (General Health) Denies complaints or symptoms of Fever, Chills. Respiratory The patient has no complaints or symptoms. Cardiovascular The patient has no complaints or symptoms. Psychiatric The patient has no complaints or symptoms. Objective Constitutional Well-nourished and well-hydrated in no acute distress. Vitals Time Taken: 1:43 PM, Height: 67 in, Temperature: 98.2 F, Pulse: 83 bpm, Respiratory Rate: 18 breaths/min, Blood Pressure: 79/54 mmHg. Respiratory normal breathing without difficulty. clear to auscultation bilaterally. Cardiovascular regular rate and rhythm with normal S1, S2. Absent posterior tibial and dorsalis pedis pulses left lower extremities. Erythma noted of the left lower leg from the calf mid way down to her foot.. Musculoskeletal Patient unable to walk without assistance. Psychiatric this patient is able to make decisions and demonstrates good insight into disease process. Alert and Oriented x 3. pleasant and cooperative. MARDELL, [name] ([id number]) General Notes: During evaluation today the two things I noted were that number one she had cyanosis and what appears to be some pus buildup around the eschar for great toe and that her fifth toe appeared to be worse, cyanotic, with potential pus buildup underneath as well. I'm concerned  that this has deteriorated quite a bit since I last saw her. She has no fevers or chills at this point in time no evidence of sepsis. Integumentary (Hair, Skin) Wound #10 status is Open. Original cause of wound was Gradually Appeared. The wound is located on the Left,Circumferential Toe Fifth. The wound measures 3.4cm length x 1.5cm width x 0.1cm depth; 4.006cm^2 area and 0.401cm^3 volume. There is no tunneling or  undermining noted. There is a none present amount of drainage noted. The wound margin is distinct with the outline attached to the wound base. There is no granulation within the wound bed. There is a large (67-100%) amount of necrotic tissue within the wound bed including Eschar. The periwound skin appearance exhibited: Mottled. The periwound skin appearance did not exhibit: Callus, Crepitus, Excoriation, Induration, Rash, Scarring, Dry/Scaly, Maceration, Atrophie Blanche, Cyanosis, Ecchymosis, Hemosiderin Staining, Pallor, Rubor, Erythema. Periwound temperature was noted as No Abnormality. Wound #3 status is Open. Original cause of wound was Pressure Injury. The wound is located on the Right,Lateral Malleolus. The wound measures 0.1cm length x 0.1cm width x 0.1cm depth; 0.008cm^2 area and 0.001cm^3 volume. There is Fat Layer (Subcutaneous Tissue) Exposed exposed. There is no tunneling or undermining noted. There is a none present amount of drainage noted. The wound margin is distinct with the outline attached to the wound base. There is medium (34-66%) pink granulation within the wound bed. There is a medium (34-66%) amount of necrotic tissue within the wound bed including Eschar and Adherent Slough. The periwound skin appearance did not exhibit: Callus, Crepitus, Excoriation, Induration, Rash, Scarring, Dry/Scaly, Maceration, Atrophie Blanche, Cyanosis, Ecchymosis, Hemosiderin Staining, Mottled, Pallor, Rubor, Erythema. Periwound temperature was noted as No Abnormality. Wound #5 status is Open. Original cause of wound was Gradually Appeared. The wound is located on the Left,Circumferential Toe Great. The wound measures 1.8cm length x 1.5cm width x 0.1cm depth; 2.121cm^2 area and 0.212cm^3 volume. There is no tunneling or undermining noted. There is a none present amount of drainage noted. The wound margin is distinct with the outline attached to the wound base. There is no granulation within the  wound bed. There is a large (67-100%) amount of necrotic tissue within the wound bed including Eschar. The periwound skin appearance did not exhibit: Callus, Crepitus, Excoriation, Induration, Rash, Scarring, Dry/Scaly, Maceration, Atrophie Blanche, Cyanosis, Ecchymosis, Hemosiderin Staining, Mottled, Pallor, Rubor, Erythema. Periwound temperature was noted as No Abnormality. Wound #6 status is Open. Original cause of wound was Gradually Appeared. The wound is located on the Left,Circumferential Toe Fourth. The wound measures 2.2cm length x 1.5cm width x 0.1cm depth; 2.592cm^2 area and 0.259cm^3 volume. There is no tunneling or undermining noted. There is a none present amount of drainage noted. There is no granulation within the wound bed. There is a large (67-100%) amount of necrotic tissue within the wound bed including Eschar. The periwound skin appearance did not exhibit: Callus, Crepitus, Excoriation, Induration, Rash, Scarring, Dry/Scaly, Maceration, Atrophie Blanche, Cyanosis, Ecchymosis, Hemosiderin Staining, Mottled, Pallor, Rubor, Erythema. Periwound temperature was noted as No Abnormality. Wound #7 status is Open. Original cause of wound was Gradually Appeared. The wound is located on the Right Gluteus. The wound measures 1cm length x 0.5cm width x 0.4cm depth; 0.393cm^2 area and 0.157cm^3 volume. There is Fat Layer (Subcutaneous Tissue) Exposed exposed. There is no tunneling noted, however, there is undermining starting at 3:00 and ending at 5:00 with a maximum distance of 0.2cm. There is a large amount of serosanguineous drainage noted. The wound margin is distinct  with the outline attached to the wound base. There is large (67-100%) red granulation within the wound bed. There is a small (1-33%) amount of necrotic tissue within the wound bed including Adherent Slough. The periwound skin appearance exhibited: Excoriation. The periwound skin appearance did not exhibit: Callus, Crepitus,  Induration, Rash, Scarring, Dry/Scaly, Maceration, Atrophie Blanche, Cyanosis, Ecchymosis, Hemosiderin Staining, Mottled, Pallor, Rubor, Erythema. Periwound temperature was noted as No Abnormality. The periwound has tenderness on palpation. Wound #8 status is Open. Original cause of wound was Gradually Appeared. The wound is located on the Left,Lateral Foot. The wound measures 11.5cm length x 2.5cm width x 0.1cm depth; 22.58cm^2 area and 2.258cm^3 volume. There is Fat Layer (Subcutaneous Tissue) Exposed exposed. There is no tunneling or undermining noted. There is a medium amount of serous drainage noted. The wound margin is distinct with the outline attached to the wound base. There is medium (34-66%) red granulation within the wound bed. There is a medium (34-66%) amount of necrotic tissue within the wound bed including Eschar and Adherent Slough. The periwound skin appearance did not exhibit: Callus, Crepitus, Excoriation, Induration, Rash, Scarring, Dry/Scaly, Maceration, Atrophie Blanche, Cyanosis, Ecchymosis, Hemosiderin Staining, Mottled, Pallor, Rubor, Erythema. Periwound temperature was noted as No Abnormality. The periwound has tenderness on palpation. 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 Wound #3 Right,Lateral Malleolus: Clean wound with Normal Saline. Wound #5 Left,Circumferential Toe  Great: Clean wound with Normal Saline. Wound #6 Left,Circumferential Toe Fourth: Clean wound with Normal Saline. Wound #7 Right Gluteus: Clean wound with Normal Saline. Wound #8 Left,Lateral Foot: Clean wound with Normal Saline. Anesthetic (add to Medication List): Wound #7 Right Gluteus: Topical Lidocaine 4% cream applied to wound bed prior to debridement (In Clinic Only). Skin Barriers/Peri-Wound Care: Wound #10 Left,Circumferential Toe Fifth: Skin Prep Wound #3 Right,Lateral Malleolus: Skin Prep Wound #5 Left,Circumferential Toe Great: Skin Prep Wound #6 Left,Circumferential Toe Fourth: Skin Prep Wound #7 Right Gluteus: Skin Prep Wound #8 Left,Lateral Foot: Skin Prep Primary Wound Dressing: Wound #10 Left,Circumferential Toe Fifth: Other: - betadine or providone-iodine (paint on wound) - silver alginate between 4th and 5th toes on left foot Wound #3 Right,Lateral Malleolus: Foam Wound #5 Left,Circumferential Toe Great: Other: - betadine or providone-iodine (paint on wound) - silver alginate between 4th and 5th toes on left foot Wound #6 Left,Circumferential Toe FourthNORMAN, [name] ([id number]) Other: - betadine or providone-iodine (paint on wound) - silver alginate between 4th and 5th toes on left foot Wound #7 Right Gluteus: Silver Collagen Drawtex Wound #8 Left,Lateral Foot: Other: - betadine or providone-iodine (paint on wound) - silver alginate between 4th and 5th toes on left foot Secondary Dressing: Wound #10 Left,Circumferential Toe Fifth: ABD pad Conform/Kerlix Wound #3 Right,Lateral Malleolus: Boardered Foam Dressing Wound #5 Left,Circumferential Toe Great: ABD pad Conform/Kerlix Wound #6 Left,Circumferential Toe Fourth: ABD pad Conform/Kerlix Wound #7 Right Gluteus: Boardered Foam Dressing Wound #8 Left,Lateral Foot: ABD pad Conform/Kerlix Dressing Change Frequency: Wound #10 Left,Circumferential Toe Fifth: Change dressing every other  day. Other: -  PRN Wound #3 Right,Lateral Malleolus: Change dressing every other day. Other: - PRN Wound #5 Left,Circumferential Toe Great: Change dressing every other day. Other: - PRN Wound #6 Left,Circumferential Toe Fourth: Change dressing every other day. Other: - PRN Wound #7 Right Gluteus: Change dressing every other day. Other: - PRN Wound #8 Left,Lateral Foot: Change dressing every other day. Other: - PRN Follow-up Appointments: Wound #10 Left,Circumferential Toe Fifth: Return Appointment in 1 week. Wound #3 Right,Lateral Malleolus: Return Appointment in 1 week. Wound #5 Left,Circumferential Toe Great: Return Appointment in 1 week. Wound #6 Left,Circumferential Toe Fourth: Return Appointment in 1 week. Wound #7 Right Gluteus: Return Appointment in 1 week. Wound #8 Left,Lateral Foot: Return Appointment in 1 week. Off-Loading: Wound #7 Right Gluteus: Turn and reposition every 2 hours Additional Orders / Instructions: Wound #10 Left,Circumferential Toe Fifth: Posten, Johnay S. ([id number]) Increase protein intake. Wound #3 Right,Lateral Malleolus: Increase protein intake. Wound #5 Left,Circumferential Toe Great: Increase protein intake. Wound #6 Left,Circumferential Toe Fourth: Increase protein intake. Wound #7 Right Gluteus: Increase protein intake. Wound #8 Left,Lateral Foot: Increase protein intake. Home Health: Wound #10 Left,Circumferential Toe Fifth: [place] Nurse may visit PRN to address patient s wound care needs. FACE TO FACE ENCOUNTER: MEDICARE and MEDICAID PATIENTS: I certify that this patient is under my care and that I had a face-to-face encounter that meets the physician face-to-face encounter requirements with this patient on this date. The encounter with the patient was in whole or in part for the following MEDICAL CONDITION: (primary reason for [place]) MEDICAL NECESSITY: I certify, that based on my  findings, NURSING services are a medically necessary home health service. HOME BOUND STATUS: I certify that my clinical findings support that this patient is homebound (i.e., Due to illness or injury, pt requires aid of supportive devices such as crutches, cane, wheelchairs, walkers, the use of special transportation or the assistance of another person to leave their place of residence. There is a normal inability to leave the home and doing so requires considerable and taxing effort. Other absences are for medical reasons / religious services and are infrequent or of short duration when for other reasons). If current dressing causes regression in wound condition, may D/C ordered dressing product/s and apply Normal Saline Moist Dressing daily until next [place] / Other MD appointment. [place] of regression in wound condition at [phone]. Please direct any NON-WOUND related issues/requests for orders to patient's Primary Care Physician Wound #3 Right,Lateral Malleolus: [place] Nurse may visit PRN to address patient s wound care needs. FACE TO FACE ENCOUNTER: MEDICARE and MEDICAID PATIENTS: I certify that this patient is under my care and that I had a face-to-face encounter that meets the physician face-to-face encounter requirements with this patient on this date. The encounter with the patient was in whole or in part for the following MEDICAL CONDITION: (primary reason for [place]) MEDICAL NECESSITY: I certify, that based on my findings, NURSING services are a medically necessary home health service. HOME BOUND STATUS: I certify that my clinical findings support that this patient is homebound (i.e., Due to illness or injury, pt requires aid of supportive devices such as crutches, cane, wheelchairs, walkers, the use of special transportation or the assistance of another person to leave their place of residence. There is a  normal inability to leave the home and doing so requires considerable and taxing effort. Other absences are for medical reasons / religious  services and are infrequent or of short duration when for other reasons). If current dressing causes regression in wound condition, may D/C ordered dressing product/s and apply Normal Saline Moist Dressing daily until next [place] / Other MD appointment. [place] of regression in wound condition at [phone]. Please direct any NON-WOUND related issues/requests for orders to patient's Primary Care Physician Wound #5 Left,Circumferential Toe Great: [place] Nurse may visit PRN to address patient s wound care needs. FACE TO FACE ENCOUNTER: MEDICARE and MEDICAID PATIENTS: I certify that this patient is under my care and that I had a face-to-face encounter that meets the physician face-to-face encounter requirements with this patient on this date. The encounter with the patient was in whole or in part for the following MEDICAL CONDITION: (primary reason for [place]) MEDICAL NECESSITY: I certify, that based on my findings, NURSING services are a medically necessary home health service. HOME BOUND STATUS: I certify that my clinical findings support that this patient is homebound (i.e., Due to illness or injury, pt requires aid of supportive devices such as crutches, cane, wheelchairs, walkers, the use of special transportation or the assistance of another person to leave their place of residence. There is a normal inability to leave the home and doing so requires considerable and taxing effort. Other absences are for medical reasons / religious services and are infrequent or of short duration when for other reasons). If current dressing causes regression in wound condition, may D/C ordered dressing product/s and apply Normal Saline Moist Dressing daily until next [place] / Other  MD appointment. [place] of regression in wound condition at [phone]. Please direct any NON-WOUND related issues/requests for orders to patient's Primary Care Physician [name], [name] ([id number]) Wound #6 Left,Circumferential Toe Fourth: [place] Nurse may visit PRN to address patient s wound care needs. FACE TO FACE ENCOUNTER: MEDICARE and MEDICAID PATIENTS: I certify that this patient is under my care and that I had a face-to-face encounter that meets the physician face-to-face encounter requirements with this patient on this date. The encounter with the patient was in whole or in part for the following MEDICAL CONDITION: (primary reason for [place]) MEDICAL NECESSITY: I certify, that based on my findings, NURSING services are a medically necessary home health service. HOME BOUND STATUS: I certify that my clinical findings support that this patient is homebound (i.e., Due to illness or injury, pt requires aid of supportive devices such as crutches, cane, wheelchairs, walkers, the use of special transportation or the assistance of another person to leave their place of residence. There is a normal inability to leave the home and doing so requires considerable and taxing effort. Other absences are for medical reasons / religious services and are infrequent or of short duration when for other reasons). If current dressing causes regression in wound condition, may D/C ordered dressing product/s and apply Normal Saline Moist Dressing daily until next [place] / Other MD appointment. [place] of regression in wound condition at [phone]. Please direct any NON-WOUND related issues/requests for orders to patient's Primary Care Physician Wound #7 Right Gluteus: [place] Nurse may visit PRN to address patient s wound care needs. FACE TO FACE ENCOUNTER: MEDICARE and  MEDICAID PATIENTS: I certify that this patient is under my care and that I had a face-to-face encounter that meets the physician face-to-face encounter requirements with  this patient on this date. The encounter with the patient was in whole or in part for the following MEDICAL CONDITION: (primary reason for [place]) MEDICAL NECESSITY: I certify, that based on my findings, NURSING services are a medically necessary home health service. HOME BOUND STATUS: I certify that my clinical findings support that this patient is homebound (i.e., Due to illness or injury, pt requires aid of supportive devices such as crutches, cane, wheelchairs, walkers, the use of special transportation or the assistance of another person to leave their place of residence. There is a normal inability to leave the home and doing so requires considerable and taxing effort. Other absences are for medical reasons / religious services and are infrequent or of short duration when for other reasons). If current dressing causes regression in wound condition, may D/C ordered dressing product/s and apply Normal Saline Moist Dressing daily until next [place] / Other MD appointment. [place] of regression in wound condition at [phone]. Please direct any NON-WOUND related issues/requests for orders to patient's Primary Care Physician Wound #8 Left,Lateral Foot: [place] Nurse may visit PRN to address patient s wound care needs. FACE TO FACE ENCOUNTER: MEDICARE and MEDICAID PATIENTS: I certify that this patient is under my care and that I had a face-to-face encounter that meets the physician face-to-face encounter requirements with this patient on this date. The encounter with the patient was in whole or in part for the following MEDICAL CONDITION: (primary reason for [place]) MEDICAL NECESSITY: I certify, that based on my findings, NURSING services are a  medically necessary home health service. HOME BOUND STATUS: I certify that my clinical findings support that this patient is homebound (i.e., Due to illness or injury, pt requires aid of supportive devices such as crutches, cane, wheelchairs, walkers, the use of special transportation or the assistance of another person to leave their place of residence. There is a normal inability to leave the home and doing so requires considerable and taxing effort. Other absences are for medical reasons / religious services and are infrequent or of short duration when for other reasons). If current dressing causes regression in wound condition, may D/C ordered dressing product/s and apply Normal Saline Moist Dressing daily until next [place] / Other MD appointment. [place] of regression in wound condition at [phone]. Please direct any NON-WOUND related issues/requests for orders to patient's Primary Care Physician At this time I did actually contact Dr. Stephens [facility] office to discuss this patient's care considering the fact that I did feel like there was significant change compared to last evaluation I had with her. I'm most concerned about the cyanosis and the fact that I feel like her blood flow may be compromised this may be a critical limb ischemia situation. It may not be but I do not want to take the chance of waiting for later appointment in the office. Their recommendation was also to send patient into the ER at [facility]: to have Dr. Anice Paganini who is on call there evaluate the patient through the ER. I explained to the patient this is what I'm recommending for her I'm gonna send a copy of this note with her as well in order to expedite her care. Again to summarize the main concerns I have are the two weeks ago she seemed to have normal coloration of her feet with areas of eschar and that is no longer the case with both your theme  as well as the cyanosis of her toes  specifically the great toe and [place], Sonya S. ([id number]) the fifth toe of the left foot. Again she has no noted fever on evaluation today. I am sending the patient immediately to the ER upon discharge from the facility today. Electronic Signature(s) Signed: 02/27/2018 2:46:43 PM By: Worthy Keeler PA-C Entered By: Worthy Keeler on 02/27/2018 14:46:00 Petzold, [name] ([id number]) -------------------------------------------------------------------------------- ROS/PFSH Details Patient Name: Jamie Martyr. Date of Service: 02/27/2018 1:45 PM Medical Record Number: [id number] Patient Account Number: [id number] Date of Birth/Sex: 11-02-1966 (52 y.o. F) Treating RN: Montey Hora Primary Care Provider: Delight Stare Other Clinician: Referring Provider: Delight Stare Treating Provider/Extender: STONE III, Daivik Overley Weeks in Treatment: 10 Information Obtained From Patient Wound History Do you currently have one or more open woundso Yes How many open wounds do you currently haveo 4 Approximately how long have you had your woundso 1 month and other 7 yrs How have you been treating your wound(s) until nowo gauze Has your wound(s) ever healed and then re-openedo No Have you had any lab work done in the past montho No Have you tested positive for an antibiotic resistant organism (MRSA, VRE)o No Have you tested positive for osteomyelitis (bone infection)o Yes Have you had any tests for circulation on your legso Yes Have you had other problems associated with your woundso Swelling Constitutional Symptoms (General Health) Complaints and Symptoms: Negative for: Fever; Chills Eyes Medical History: Negative for: Cataracts; Glaucoma; Optic Neuritis Ear/Nose/Mouth/Throat Medical History: Negative for: Chronic sinus problems/congestion; Middle ear problems Hematologic/Lymphatic Medical History: Positive for: Anemia Negative for: Hemophilia; Human Immunodeficiency Virus; Lymphedema; Sickle Cell  Disease Respiratory Complaints and Symptoms: No Complaints or Symptoms Medical History: Negative for: Aspiration; Asthma; Chronic Obstructive Pulmonary Disease (COPD); Pneumothorax; Sleep Apnea; Tuberculosis Cardiovascular Complaints and Symptoms: No Complaints or Symptoms Medical History: CLYDETTE, PRIVITERA. ([id number]) Positive for: Arrhythmia - a-fib; Coronary Artery Disease; Hypotension; Peripheral Arterial Disease Gastrointestinal Medical History: Negative for: Cirrhosis ; Colitis; Crohnos; Hepatitis A; Hepatitis B; Hepatitis C Endocrine Medical History: Positive for: Type II Diabetes Treated with: Insulin, Oral agents Blood sugar tested every day: Yes Tested : 4 times a day Blood sugar testing results: Bedtime: 190 Genitourinary Medical History: Negative for: End Stage Renal Disease Immunological Medical History: Negative for: Lupus Erythematosus; Raynaudos; Scleroderma Integumentary (Skin) Medical History: Negative for: History of Burn; History of pressure wounds Musculoskeletal Medical History: Positive for: Osteoarthritis Neurologic Medical History: Negative for: Dementia; Neuropathy; Quadriplegia; Paraplegia; Seizure Disorder Oncologic Medical History: Negative for: Received Chemotherapy; Received Radiation Psychiatric Complaints and Symptoms: No Complaints or Symptoms Medical History: Negative for: Anorexia/bulimia; Confinement Anxiety Immunizations Pneumococcal Vaccine: Received Pneumococcal Vaccination: BEONCA, GIBB ([id number]) Implantable Devices Family and Social History Cancer: No; Diabetes: Yes - Mother,Father,Maternal Grandparents,Paternal Grandparents,Child; Heart Disease: Yes - Father,Mother,Maternal Grandparents,Paternal Grandparents; Hereditary Spherocytosis: Yes - Mother; Hypertension: Yes - Mother,Siblings,Paternal Grandparents,Maternal Grandparents; Kidney Disease: No; Lung Disease: No; Seizures: Yes - Maternal  Grandparents,Paternal Grandparents; Stroke: No; Thyroid Problems: Yes - Siblings; Tuberculosis: No; Current every day smoker; Marital Status - Married; Alcohol Use: Rarely; Drug Use: No History; Caffeine Use: Moderate; Financial Concerns: No; Food, Clothing or Shelter Needs: No; Support System Lacking: No; Transportation Concerns: No; Advanced Directives: No; Patient does not want information on Advanced Directives; Do not resuscitate: No; Living Will: No; Medical Power of Attorney: No Physician Affirmation I have reviewed and agree with the above information. Electronic Signature(s) Signed: 02/27/2018 2:46:43 PM By: Worthy Keeler PA-C Signed: 02/27/2018 4:45:51 PM  By: Montey Hora Entered By: Worthy Keeler on 02/27/2018 14:42:21 Barrack, Meklit Chauncey Cruel ([id number]) -------------------------------------------------------------------------------- SuperBill Details Patient Name: Jamie Martyr. Date of Service: 02/27/2018 Medical Record Number: [id number] Patient Account Number: [id number] Date of Birth/Sex: 02-12-1966 (52 y.o. 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 S31.819S Unspecified open wound of right buttock, sequela L97.312 Non-pressure chronic ulcer of right ankle with fat layer exposed Facility Procedures CPT4 Code: 27062376 Description: 28315 - WOUND CARE VISIT-LEV 5 EST PT Modifier: Quantity: 1 Physician Procedures CPT4: Description Modifier Quantity Code  [id number] - WC PHYS LEVEL 4 - EST PT 1 ICD-10 Diagnosis Description P71.062I Unspecified open wound of right buttock, sequela I70.393 Other atherosclerosis of unspecified type of bypass graft(s) of the  extremities, bilateral legs 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

## 2018-03-06 NOTE — Progress Notes (Signed)
DESTINAE, NEUBECKER ([id number]) Visit Report for 02/27/2018 Arrival Information Details Patient Name: [name], [name]. Date of Service: 02/27/2018 1:45 PM Medical Record Number: [id number] Patient Account Number: [id number] Date of Birth/Sex: 08-Sep-1966 (52 y.o. F) Treating RN: Roger Shelter Primary Care Jp Eastham: Delight Stare Other Clinician: Referring Lenita Peregrina: Delight Stare Treating Facundo Allemand/Extender: Melburn Hake, HOYT Weeks in Treatment: 10 Visit Information History Since Last Visit All ordered tests and consults were completed: No Patient Arrived: [name] Added or deleted any medications: No Arrival Time: 13:42 Any new allergies or adverse reactions: No Accompanied By: self Had a fall or experienced change in No Transfer Assistance: None activities of daily living that may affect Patient Identification Verified: Yes risk of falls: Secondary Verification Process Yes Signs or symptoms of abuse/neglect since last visito No Completed: Hospitalized since last visit: No Patient Requires Transmission-Based No Implantable device outside of the clinic excluding No Precautions: cellular tissue based products placed in the center Patient Has Alerts: Yes since last visit: Patient Alerts: Patient on Blood Pain Present Now: No Thinner DM II Xarelto Electronic Signature(s) Signed: 02/27/2018 4:04:12 PM By: Roger Shelter Entered By: Roger Shelter on 02/27/2018 13:43:16 [name], [name] ([id number]) -------------------------------------------------------------------------------- Clinic Level of Care Assessment Details Patient Name: [name], [name]. Date of Service: 02/27/2018 1:45 PM Medical Record Number: [id number] Patient Account Number: [id number] Date of Birth/Sex: 01/13/1966 (52 y.o. F) Treating RN: Montey Hora Primary Care Yasha Tibbett: Delight Stare Other Clinician: Referring Ananiah Maciolek: Delight Stare Treating Laporscha Linehan/Extender: Melburn Hake, HOYT Weeks in Treatment: 10 Clinic  Level of Care Assessment Items TOOL 4 Quantity Score []  - Use when only an EandM is performed on FOLLOW-[name] visit 0 ASSESSMENTS - Nursing Assessment / Reassessment X - Reassessment of Co-morbidities (includes updates in patient status) 1 10 X- 1 5 Reassessment of Adherence to Treatment Plan ASSESSMENTS - Wound and Skin Assessment / Reassessment []  - Simple Wound Assessment / Reassessment - one wound 0 X- 6 5 Complex Wound Assessment / Reassessment - multiple wounds []  - 0 Dermatologic / Skin Assessment (not related to wound area) ASSESSMENTS - Focused Assessment []  - Circumferential Edema Measurements - multi extremities 0 []  - 0 Nutritional Assessment / Counseling / Intervention X- 1 5 Lower Extremity Assessment (monofilament, tuning fork, pulses) X- 1 10 Peripheral Arterial Disease Assessment (using hand held doppler) ASSESSMENTS - Ostomy and/or Continence Assessment and Care []  - Incontinence Assessment and Management 0 []  - 0 Ostomy Care Assessment and Management (repouching, etc.) PROCESS - Coordination of Care X - Simple Patient / Family Education for ongoing care 1 15 []  - 0 Complex (extensive) Patient / Family Education for ongoing care []  - 0 Staff obtains Programmer, systems, Records, Test Results / Process Orders []  - 0 Staff telephones HHA, Nursing Homes / Clarify orders / etc []  - 0 Routine Transfer to another Facility (non-emergent condition) []  - 0 Routine Hospital Admission (non-emergent condition) []  - 0 New Admissions / Biomedical engineer / Ordering NPWT, Apligraf, etc. []  - 0 Emergency Hospital Admission (emergent condition) X- 1 10 Simple Discharge Coordination [name], [name] ([id number]) []  - 0 Complex (extensive) Discharge Coordination PROCESS - Special Needs []  - Pediatric / Minor Patient Management 0 []  - 0 Isolation Patient Management []  - 0 Hearing / Language / Visual special needs []  - 0 Assessment of Community assistance (transportation, D/C  planning, etc.) []  - 0 Additional assistance / Altered mentation []  - 0 Support Surface(s) Assessment (bed, cushion, seat, etc.) INTERVENTIONS - Wound Cleansing / Measurement []  - Simple Wound Cleansing -  one wound 0 X- 6 5 Complex Wound Cleansing - multiple wounds X- 1 5 Wound Imaging (photographs - any number of wounds) []  - 0 Wound Tracing (instead of photographs) []  - 0 Simple Wound Measurement - one wound X- 6 5 Complex Wound Measurement - multiple wounds INTERVENTIONS - Wound Dressings X - Small Wound Dressing one or multiple wounds 6 10 []  - 0 Medium Wound Dressing one or multiple wounds []  - 0 Large Wound Dressing one or multiple wounds []  - 0 Application of Medications - topical []  - 0 Application of Medications - injection INTERVENTIONS - Miscellaneous []  - External ear exam 0 []  - 0 Specimen Collection (cultures, biopsies, blood, body fluids, etc.) []  - 0 Specimen(s) / Culture(s) sent or taken to Lab for analysis []  - 0 Patient Transfer (multiple staff / Civil Service fast streamer / Similar devices) []  - 0 Simple Staple / Suture removal (25 or less) []  - 0 Complex Staple / Suture removal (26 or more) []  - 0 Hypo / Hyperglycemic Management (close monitor of Blood Glucose) []  - 0 Ankle / Brachial Index (ABI) - do not check if billed separately X- 1 5 Vital Signs [name], [name] ([id number]) Has the patient been seen at the hospital within the last three years: Yes Total Score: 215 Level Of Care: New/Established - Level 5 Electronic Signature(s) Signed: 02/27/2018 4:45:51 PM By: Montey Hora Entered By: Montey Hora on 02/27/2018 14:29:40 [name], [name] ([id number]) -------------------------------------------------------------------------------- Encounter Discharge Information Details Patient Name: [name]. Date of Service: 02/27/2018 1:45 PM Medical Record Number: [id number] Patient Account Number: [id number] Date of Birth/Sex: Apr 19, 1966 (52 y.o.  F) Treating RN: Montey Hora Primary Care Hollis Oh: Delight Stare Other Clinician: Referring Jaydon Soroka: Delight Stare Treating Aniella Wandrey/Extender: Melburn Hake, HOYT Weeks in Treatment: 10 Encounter Discharge Information Items Discharge Pain Level: 0 Discharge Condition: Stable Ambulatory Status: [place] Emergency Discharge Destination: Room Transportation: Private Auto Accompanied By: husband Schedule Follow-[name] Appointment: Yes Medication Reconciliation completed and No provided to Patient/Care Vidhi Delellis: Provided on Clinical Summary of Care: 02/27/2018 Form Type Recipient Paper Patient RM Electronic Signature(s) Signed: 03/02/2018 4:22:27 PM By: Alric Quan Entered By: Alric Quan on 02/27/2018 14:49:28 Kinne, Odie S. ([id number]) -------------------------------------------------------------------------------- Lower Extremity Assessment Details Patient Name: [name]. Date of Service: 02/27/2018 1:45 PM Medical Record Number: [id number] Patient Account Number: [id number] Date of Birth/Sex: 1966/06/22 (52 y.o. F) Treating RN: Roger Shelter Primary Care Yazlynn Birkeland: Delight Stare Other Clinician: Referring Kimberley Dastrup: Delight Stare Treating Nastassja Witkop/Extender: STONE III, HOYT Weeks in Treatment: 10 Edema Assessment Assessed: [Left: No] [Right: No] Edema: [Left: No] [Right: No] Vascular Assessment Claudication: Claudication Assessment [Left:None] [Right:None] Pulses: Dorsalis Pedis Palpable: [Left:Yes] [Right:Yes] Posterior Tibial Extremity colors, hair growth, and conditions: Extremity Color: [Left:Red] Hair Growth on Extremity: [Left:No] [Right:No] Temperature of Extremity: [Left:Warm] [Right:Cool] Capillary Refill: [Left:< 3 seconds] [Right:< 3 seconds] Toe Nail Assessment Left: Right: Thick: Yes Yes Discolored: Yes Yes Deformed: No No Improper Length and Hygiene: Yes Yes Electronic Signature(s) Signed: 02/27/2018 4:04:12 PM By: Roger Shelter Entered  By: Roger Shelter on 02/27/2018 14:00:59 Guerrant, Sabrea S. ([id number]) -------------------------------------------------------------------------------- Multi Wound Chart Details Patient Name: [name]. Date of Service: 02/27/2018 1:45 PM Medical Record Number: [id number] Patient Account Number: [id number] Date of Birth/Sex: Jul 25, 1966 (52 y.o. F) Treating RN: Montey Hora Primary Care Caz Weaver: Delight Stare Other Clinician: Referring Bettymae Yott: Delight Stare Treating Saige Busby/Extender: STONE III, HOYT Weeks in Treatment: 10 Vital Signs Height(in): 67 Pulse(bpm): 83 Weight(lbs): Blood Pressure(mmHg): 79/54 Body Mass Index(BMI): Temperature(F): 98.2 Respiratory Rate 18 (breaths/min): Photos: [  10:No Photos] [3:No Photos] [5:No Photos] Wound Location: [10:Left Toe Fifth - Circumfernential] [3:Right Malleolus - Lateral] [5:Left Toe Great - Circumfernential] Wounding Event: [10:Gradually Appeared] [3:Pressure Injury] [5:Gradually Appeared] 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] [3:Diabetic Wound/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] [5:Lower Extremity] Comorbid History: [10:Anemia, Arrhythmia, Coronary Artery Disease, Hypotension, Peripheral Arterial Disease, Type II Diabetes, Osteoarthritis] [3:Anemia, Arrhythmia, Coronary Anemia, Arrhythmia, Coronary Artery Disease, Hypotension, Artery Disease,  Hypotension, Peripheral Arterial Disease, Peripheral Arterial Disease, Type II Diabetes, Osteoarthritis] [5:Type II Diabetes, Osteoarthritis] Date Acquired: [10:11/27/2017] [3:09/23/2016] [5:11/27/2017] Weeks of Treatment: [10:8] [3:10] [5:10] Wound Status: [10:Open] [3:Open] [5:Open] Pending Amputation on [10:No] [3:No] [5:Yes] Presentation: Measurements L x W x D [10:3.4x1.5x0.1] [3:0.1x0.1x0.1] [5:1.8x1.5x0.1] (cm) Area (cm) : [10:4.006] [3:0.008] [5:2.121] Volume (cm) : [10:0.401] [3:0.001] [5:0.212] % Reduction in Area:  [10:57.50%] [3:99.10%] [5:92.90%] % Reduction in Volume: [10:57.40%] [3:99.40%] [5:92.90%] Undermining: [10:No] [3:No] [5:No] Classification: [10:Grade 1] [3:Grade 2] [5:Grade 2] Exudate Amount: [10:None Present] [3:None Present] [5:None Present] Exudate Type: [10:N/A] [3:N/A] [5:N/A] Exudate Color: [10:N/A] [3:N/A] [5:N/A] Wound Margin: [10:Distinct, outline attached] [3:Distinct, outline attached] [5:Distinct, outline attached] Granulation Amount: [10:None Present (0%)] [3:Medium (34-66%)] [5:None Present (0%)] Granulation Quality: [10:N/A] [3:Pink] [5:N/A] Necrotic Amount: [10:Large (67-100%)] [3:Medium (34-66%)] [5:Large (67-100%)] Necrotic Tissue: [10:Eschar] [3:Eschar, Adherent Slough] [5:Eschar] Exposed Structures: [10:Fascia: No Fat Layer (Subcutaneous Tissue) Exposed: No Tendon: No Muscle: No] [3:Fat Layer (Subcutaneous Tissue) Exposed: Yes Fascia: No Tendon: No Muscle: No] [5:N/A] Joint: No Joint: No Bone: No Bone: No Epithelialization: Large (67-100%) Large (67-100%) None Periwound Skin Texture: Excoriation: No Excoriation: No Excoriation: No Induration: No Induration: No Induration: No Callus: No Callus: No Callus: No Crepitus: No Crepitus: No Crepitus: No Rash: No Rash: No Rash: No Scarring: No Scarring: No Scarring: No Periwound Skin Moisture: Maceration: No Maceration: No Maceration: No Dry/Scaly: No Dry/Scaly: No Dry/Scaly: No Periwound Skin Color: Mottled: Yes Atrophie Blanche: No Atrophie Blanche: No Atrophie Blanche: No Cyanosis: No Cyanosis: No Cyanosis: No Ecchymosis: No Ecchymosis: No Ecchymosis: No Erythema: No Erythema: No Erythema: No Hemosiderin Staining: No Hemosiderin Staining: No Hemosiderin Staining: No Mottled: No Mottled: No Pallor: No Pallor: No Pallor: No Rubor: No Rubor: No Rubor: No Temperature: No Abnormality No Abnormality No Abnormality Tenderness on Palpation: No No No Wound Preparation: Ulcer  Cleansing: Ulcer Cleansing: Ulcer Cleansing: Rinsed/Irrigated with Saline Rinsed/Irrigated with Saline Rinsed/Irrigated with Saline Topical Anesthetic Applied: Topical Anesthetic Applied: Topical Anesthetic Applied: None None None Wound Number: [id number]  Photos: No Photos No Photos No Photos Wound Location: Left Toe Fourth - Right Gluteus Left Foot - Lateral Circumfernential Wounding Event: Gradually Appeared Gradually Appeared Gradually Appeared 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 Comorbid History: Anemia, Arrhythmia, Coronary Anemia, Arrhythmia, Coronary Anemia, Arrhythmia, Coronary Artery Disease, Hypotension, Artery Disease, Hypotension, Artery Disease, Hypotension, Peripheral Arterial Disease, Peripheral Arterial Disease, Peripheral Arterial Disease, Type II Diabetes, Type II Diabetes, Type II Diabetes, Osteoarthritis Osteoarthritis Osteoarthritis Date Acquired: 11/27/2017 09/18/2016 11/27/2017 Weeks of Treatment: 10 10 10  Wound Status: Open Open Open Pending Amputation on Yes No Yes Presentation: Measurements L x W x D 2.2x1.5x0.1 1x0.5x0.4 11.5x2.5x0.1 (cm) Area (cm) : 2.592 0.393 22.58 Volume (cm) : 0.259 0.157 2.258 % Reduction in Area: 45.00% 73.70% 77.50% % Reduction in Volume: 45.00% 85.00% 77.50% Starting Position 1 3 (o'clock): Ending Position 1 5 (o'clock): Maximum Distance 1 (cm): 0.2 Undermining: No Yes No 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 Exudate Amount: None Present Large Medium [name], Jamie  S. ([id number]) Exudate Type: N/A Serosanguineous Serous Exudate Color: N/A red, brown amber Wound Margin: N/A Distinct, outline attached Distinct, outline attached Granulation Amount: None Present (0%) Large (67-100%) Medium (34-66%) Granulation Quality: N/A Red Red Necrotic Amount: Large (67-100%) Small (1-33%) Medium (34-66%) Necrotic Tissue: Eschar Adherent Slough  Eschar, Adherent Slough Exposed Structures: N/A Fat Layer (Subcutaneous Fat Layer (Subcutaneous Tissue) Exposed: Yes Tissue) Exposed: Yes Fascia: No Tendon: No Muscle: No Joint: No Bone: No Epithelialization: None Small (1-33%) None Periwound Skin Texture: Excoriation: No Excoriation: Yes Excoriation: No Induration: No Induration: No Induration: No Callus: No Callus: No Callus: No Crepitus: No Crepitus: No Crepitus: No Rash: No Rash: No Rash: No Scarring: No Scarring: No Scarring: No Periwound Skin Moisture: Maceration: No Maceration: No Maceration: No Dry/Scaly: No Dry/Scaly: No Dry/Scaly: No Periwound Skin Color: Atrophie Blanche: No Atrophie Blanche: No Atrophie Blanche: No Cyanosis: No Cyanosis: No Cyanosis: No Ecchymosis: No Ecchymosis: No Ecchymosis: No Erythema: No Erythema: No Erythema: No Hemosiderin Staining: No Hemosiderin Staining: No Hemosiderin Staining: No Mottled: No Mottled: No Mottled: No Pallor: No Pallor: No Pallor: No Rubor: No Rubor: No Rubor: No Temperature: No Abnormality No Abnormality No Abnormality Tenderness on Palpation: No Yes Yes Wound Preparation: N/A Ulcer Cleansing: Ulcer Cleansing: Not Cleansed Rinsed/Irrigated with Saline Topical Anesthetic Applied: Other: lidocaine Treatment Notes Electronic Signature(s) Signed: 02/27/2018 4:45:51 PM By: Montey Hora Entered By: Montey Hora on 02/27/2018 14:21:26 Lattner, [name] ([id number]) -------------------------------------------------------------------------------- [place] Details Patient Name: [name], [name]. Date of Service: 02/27/2018 1:45 PM Medical Record Number: [id number] Patient Account Number: [id number] Date of Birth/Sex: [birth year] (52 y.o. F) Treating RN: Montey Hora Primary Care Deshia Vanderhoof: Delight Stare Other Clinician: Referring Primo Innis: Delight Stare Treating Danel Studzinski/Extender: STONE III, HOYT Weeks in Treatment:  10 Active Inactive ` Abuse / Safety / Falls / Self Care Management Nursing Diagnoses: Potential for falls Goals: Patient will not experience any injury related to falls Date Initiated: 12/18/2017 Target Resolution Date: 02/21/2018 Goal Status: Active Interventions: Assess Activities of Daily Living upon admission and as needed Assess fall risk on admission and as needed Assess: immobility, friction, shearing, incontinence upon admission and as needed Assess impairment of mobility on admission and as needed per policy Assess personal safety and home safety (as indicated) on admission and as needed Assess self care needs on admission and as needed Provide education on basic hygiene Provide education on fall prevention Treatment Activities: Education provided on Basic Hygiene : 12/18/2017 Notes: ` Necrotic Tissue Nursing Diagnoses: Impaired tissue integrity related to necrotic/devitalized tissue Knowledge deficit related to management of necrotic/devitalized tissue Goals: Patient/caregiver will verbalize understanding of reason and process for debridement of necrotic tissue Date Initiated: 12/18/2017 Target Resolution Date: 03/28/2018 Goal Status: Active Interventions: Assess patient pain level pre-, during and post procedure and prior to discharge Provide education on necrotic tissue and debridement process Notes: ISLEY, WEISHEIT ([id number]) ` Nutrition Nursing Diagnoses: Imbalanced nutrition Impaired glucose control: actual or potential Potential for alteratiion in Nutrition/Potential for imbalanced nutrition Goals: Patient/caregiver verbalizes understanding of need to maintain therapeutic glucose control per primary care physician Date Initiated: 12/18/2017 Target Resolution Date: 02/21/2018 Goal Status: Active Interventions: Assess patient nutrition upon admission and as needed per policy Notes: ` Orientation to the Wound Care Program Nursing Diagnoses: Knowledge  deficit related to the wound healing center program Goals: Patient/caregiver will verbalize understanding of the [place] Program Date Initiated: 12/18/2017 Target Resolution Date: 12/27/2017 Goal Status: Active Interventions: Provide education on orientation to the wound center Notes: `  Pain, Acute or Chronic Nursing Diagnoses: Pain, acute or chronic: actual or potential Potential alteration in comfort, pain Goals: Patient/caregiver will verbalize adequate pain control between visits Date Initiated: 12/18/2017 Target Resolution Date: 03/28/2018 Goal Status: Active Interventions: Complete pain assessment as per visit requirements Notes: ` Wound/Skin Impairment [name], [name] ([id number]) Nursing Diagnoses: Impaired tissue integrity Knowledge deficit related to ulceration/compromised skin integrity Goals: Ulcer/skin breakdown will have a volume reduction of 80% by week 12 Date Initiated: 12/18/2017 Target Resolution Date: 03/21/2018 Goal Status: Active Interventions: Assess patient/caregiver ability to perform ulcer/skin care regimen upon admission and as needed Assess ulceration(s) every visit Notes: Electronic Signature(s) Signed: 02/27/2018 4:45:51 PM By: Montey Hora Entered By: Montey Hora on 02/27/2018 14:20:50 Dethlefs, [name] ([id number]) -------------------------------------------------------------------------------- Pain Assessment Details Patient Name: [name]. Date of Service: 02/27/2018 1:45 PM Medical Record Number: [id number] Patient Account Number: [id number] Date of Birth/Sex: 22-Mar-1966 (52 y.o. F) Treating RN: Roger Shelter Primary Care Shane Badeaux: Delight Stare Other Clinician: Referring Ryen Heitmeyer: Delight Stare Treating Tyjon Bowen/Extender: STONE III, HOYT Weeks in Treatment: 10 Active Problems Location of Pain Severity and Description of Pain Patient Has Paino No Site Locations Pain Management and Medication Current Pain  Management: Electronic Signature(s) Signed: 02/27/2018 4:04:12 PM By: Roger Shelter Entered By: Roger Shelter on 02/27/2018 13:43:35 Sculley, [name] ([id number]) -------------------------------------------------------------------------------- Patient/Caregiver Education Details Patient Name: [name]. Date of Service: 02/27/2018 1:45 PM Medical Record Number: [id number] Patient Account Number: [id number] Date of Birth/Gender: 1966/05/10 (52 y.o. F) Treating RN: Ahmed Prima Primary Care Physician: Delight Stare Other Clinician: Referring Physician: Delight Stare Treating Physician/Extender: Melburn Hake, HOYT Weeks in Treatment: 10 Education Assessment Education Provided To: Patient Education Topics Provided Wound/Skin Impairment: Handouts: Caring for Your Ulcer, Other: change dressing as ordered Methods: Demonstration, Explain/Verbal Responses: State content correctly Electronic Signature(s) Signed: 03/02/2018 4:22:27 PM By: Alric Quan Entered By: Alric Quan on 02/27/2018 14:49:43 Upchurch, Lilia S. ([id number]) -------------------------------------------------------------------------------- Wound Assessment Details Patient Name: [name]. Date of Service: 02/27/2018 1:45 PM Medical Record Number: [id number] Patient Account Number: [id number] Date of Birth/Sex: 1966/07/25 (52 y.o. F) Treating RN: Roger Shelter Primary Care Jacolby Risby: Delight Stare Other Clinician: Referring Xochitl Egle: Delight Stare Treating Linnette Panella/Extender: STONE III, HOYT Weeks in Treatment: 10 Wound Status Wound Number: 10 Primary Diabetic Wound/Ulcer of the Lower Extremity Etiology: Wound Location: Left Toe Fifth - Circumfernential Wound Open Wounding Event: Gradually Appeared Status: Date Acquired: 11/27/2017 Comorbid Anemia, Arrhythmia, Coronary Artery Disease, Weeks Of Treatment: 8 History: Hypotension, Peripheral Arterial Disease, Type Clustered Wound: No II Diabetes,  Osteoarthritis Photos Photo Uploaded By: Roger Shelter on 02/27/2018 16:17:36 Wound Measurements Length: (cm) 3.4 Width: (cm) 1.5 Depth: (cm) 0.1 Area: (cm) 4.006 Volume: (cm) 0.401 % Reduction in Area: 57.5% % Reduction in Volume: 57.4% Epithelialization: Large (67-100%) Tunneling: No Undermining: No Wound Description Classification: Grade 1 Wound Margin: Distinct, outline attached Exudate Amount: None Present Foul Odor After Cleansing: No Slough/Fibrino Yes Wound Bed Granulation Amount: None Present (0%) Exposed Structure Necrotic Amount: Large (67-100%) Fascia Exposed: No Necrotic Quality: Eschar Fat Layer (Subcutaneous Tissue) Exposed: No Tendon Exposed: No Muscle Exposed: No Joint Exposed: No Bone Exposed: No Periwound Skin Texture Texture Color No Abnormalities Noted: No No Abnormalities Noted: No Tonner, Josette S. ([id number]) Callus: No Atrophie Blanche: No Crepitus: No Cyanosis: No Excoriation: No Ecchymosis: No Induration: No Erythema: No Rash: No Hemosiderin Staining: No Scarring: No Mottled: Yes Pallor: No Moisture Rubor: No No Abnormalities Noted: No Dry / Scaly: No Temperature / Pain Maceration: No Temperature: No Abnormality Wound  Preparation Ulcer Cleansing: Rinsed/Irrigated with Saline Topical Anesthetic Applied: None Treatment Notes Wound #10 (Left, Circumferential Toe Fifth) 1. Cleansed with: Clean wound with Normal Saline 2. Anesthetic Topical Lidocaine 4% cream to wound bed prior to debridement 4. Dressing Applied: Other dressing (specify in notes) 5. Secondary Dressing Applied ABD Pad Kerlix/Conform 7. Secured with Tape Notes betadine and silvercel between the 4th and 5th toes Electronic Signature(s) Signed: 02/27/2018 4:04:12 PM By: Roger Shelter Entered By: Roger Shelter on 02/27/2018 13:56:31 [place], [name] ([id number]) -------------------------------------------------------------------------------- Wound  Assessment Details Patient Name: [name], [name]. Date of Service: 02/27/2018 1:45 PM Medical Record Number: [id number] Patient Account Number: [id number] Date of Birth/Sex: 1966-10-10 (52 y.o. F) Treating RN: Roger Shelter Primary Care Shawntee Mainwaring: Delight Stare Other Clinician: Referring Charae Depaolis: Delight Stare Treating Yanelie Abraha/Extender: STONE III, HOYT Weeks in Treatment: 10 Wound Status Wound Number: 3 Primary Diabetic Wound/Ulcer of the Lower Extremity Etiology: Wound Location: Right Malleolus - Lateral Wound Open Wounding Event: Pressure Injury Status: Date Acquired: 09/23/2016 Comorbid Anemia, Arrhythmia, Coronary Artery Disease, Weeks Of Treatment: 10 History: Hypotension, Peripheral Arterial Disease, Type Clustered Wound: No II Diabetes, Osteoarthritis Photos Photo Uploaded By: Roger Shelter on 02/27/2018 16:18:40 Wound Measurements Length: (cm) 0.1 Width: (cm) 0.1 Depth: (cm) 0.1 Area: (cm) 0.008 Volume: (cm) 0.001 % Reduction in Area: 99.1% % Reduction in Volume: 99.4% Epithelialization: Large (67-100%) Tunneling: No Undermining: No Wound Description Classification: Grade 2 Wound Margin: Distinct, outline attached Exudate Amount: None Present Foul Odor After Cleansing: No Slough/Fibrino Yes Wound Bed Granulation Amount: Medium (34-66%) Exposed Structure Granulation Quality: Pink Fascia Exposed: No Necrotic Amount: Medium (34-66%) Fat Layer (Subcutaneous Tissue) Exposed: Yes Necrotic Quality: Eschar, Adherent Slough Tendon Exposed: No Muscle Exposed: No Joint Exposed: No Bone Exposed: No Periwound Skin Texture Texture Color No Abnormalities Noted: No No Abnormalities Noted: No Pasquarelli, Davanee S. ([id number]) Callus: No Atrophie Blanche: No Crepitus: No Cyanosis: No Excoriation: No Ecchymosis: No Induration: No Erythema: No Rash: No Hemosiderin Staining: No Scarring: No Mottled: No Pallor: No Moisture Rubor: No No Abnormalities Noted:  No Dry / Scaly: No Temperature / Pain Maceration: No Temperature: No Abnormality Wound Preparation Ulcer Cleansing: Rinsed/Irrigated with Saline Topical Anesthetic Applied: None Treatment Notes Wound #3 (Right, Lateral Malleolus) 1. Cleansed with: Clean wound with Normal Saline 2. Anesthetic Topical Lidocaine 4% cream to wound bed prior to debridement 5. Secondary Dressing Applied Bordered Foam Dressing Foam 7. Secured with Recruitment consultant) Signed: 02/27/2018 4:04:12 PM By: Roger Shelter Entered By: Roger Shelter on 02/27/2018 13:56:58 Lonardo, [name] ([id number]) -------------------------------------------------------------------------------- Wound Assessment Details Patient Name: [name], [name]. Date of Service: 02/27/2018 1:45 PM Medical Record Number: [id number] Patient Account Number: [id number] Date of Birth/Sex: 1966-02-06 (52 y.o. F) Treating RN: Roger Shelter Primary Care Mack Alvidrez: Delight Stare Other Clinician: Referring Oneika Simonian: Delight Stare Treating Cristan Hout/Extender: STONE III, HOYT Weeks in Treatment: 10 Wound Status Wound Number: 5 Primary Diabetic Wound/Ulcer of the Lower Extremity Etiology: Wound Location: Left Toe Great - Circumfernential Wound Open Wounding Event: Gradually Appeared Status: Date Acquired: 11/27/2017 Comorbid Anemia, Arrhythmia, Coronary Artery Disease, Weeks Of Treatment: 10 History: Hypotension, Peripheral Arterial Disease, Type Clustered Wound: No II Diabetes, Osteoarthritis Pending Amputation On Presentation Photos Photo Uploaded By: Roger Shelter on 02/27/2018 16:18:41 Wound Measurements Length: (cm) 1.8 Width: (cm) 1.5 Depth: (cm) 0.1 Area: (cm) 2.121 Volume: (cm) 0.212 % Reduction in Area: 92.9% % Reduction in Volume: 92.9% Epithelialization: None Tunneling: No Undermining: No Wound Description Classification: Grade 2 Wound Margin: Distinct, outline attached Exudate Amount: None  Present  Foul Odor After Cleansing: No Slough/Fibrino Yes Wound Bed Granulation Amount: None Present (0%) Necrotic Amount: Large (67-100%) Necrotic Quality: Eschar Periwound Skin Texture Texture Color No Abnormalities Noted: No No Abnormalities Noted: No Callus: No Atrophie Blanche: No Crepitus: No Cyanosis: No Excoriation: No Ecchymosis: No Oh, Atha S. ([id number]) Induration: No Erythema: No Rash: No Hemosiderin Staining: No Scarring: No Mottled: No Pallor: No Moisture Rubor: No No Abnormalities Noted: No Dry / Scaly: No Temperature / Pain Maceration: No Temperature: No Abnormality Wound Preparation Ulcer Cleansing: Rinsed/Irrigated with Saline Topical Anesthetic Applied: None Treatment Notes Wound #5 (Left, Circumferential Toe Great) 1. Cleansed with: Clean wound with Normal Saline 2. Anesthetic Topical Lidocaine 4% cream to wound bed prior to debridement 4. Dressing Applied: Other dressing (specify in notes) 5. Secondary Dressing Applied ABD Pad Kerlix/Conform 7. Secured with Tape Notes betadine and silvercel between the 4th and 5th toes Electronic Signature(s) Signed: 02/27/2018 4:04:12 PM By: Roger Shelter Entered By: Roger Shelter on 02/27/2018 13:49:40 Benton, [name] ([id number]) -------------------------------------------------------------------------------- Wound Assessment Details Patient Name: [name], [name]. Date of Service: 02/27/2018 1:45 PM Medical Record Number: [id number] Patient Account Number: [id number] Date of Birth/Sex: [birth year] (52 y.o. F) Treating RN: Roger Shelter Primary Care Dalylah Ramey: Delight Stare Other Clinician: Referring Dastan Krider: Delight Stare Treating Roczen Waymire/Extender: STONE III, HOYT Weeks in Treatment: 10 Wound Status Wound Number: 6 Primary Diabetic Wound/Ulcer of the Lower Extremity Etiology: Wound Location: Left Toe Fourth - Circumfernential Wound Open Wounding Event: Gradually  Appeared Status: Date Acquired: 11/27/2017 Comorbid Anemia, Arrhythmia, Coronary Artery Disease, Weeks Of Treatment: 10 History: Hypotension, Peripheral Arterial Disease, Type Clustered Wound: No II Diabetes, Osteoarthritis Pending Amputation On Presentation Photos Photo Uploaded By: Roger Shelter on 02/27/2018 16:20:34 Wound Measurements Length: (cm) 2.2 Width: (cm) 1.5 Depth: (cm) 0.1 Area: (cm) 2.592 Volume: (cm) 0.259 % Reduction in Area: 45% % Reduction in Volume: 45% Epithelialization: None Tunneling: No Undermining: No Wound Description Classification: Grade 2 Exudate Amount: None Present Foul Odor After Cleansing: No Slough/Fibrino Yes Wound Bed Granulation Amount: None Present (0%) Necrotic Amount: Large (67-100%) Necrotic Quality: Eschar Periwound Skin Texture Texture Color No Abnormalities Noted: No No Abnormalities Noted: No Callus: No Atrophie Blanche: No Crepitus: No Cyanosis: No Excoriation: No Ecchymosis: No Leonor, Okie S. ([id number]) Induration: No Erythema: No Rash: No Hemosiderin Staining: No Scarring: No Mottled: No Pallor: No Moisture Rubor: No No Abnormalities Noted: No Dry / Scaly: No Temperature / Pain Maceration: No Temperature: No Abnormality Treatment Notes Wound #6 (Left, Circumferential Toe Fourth) 1. Cleansed with: Clean wound with Normal Saline 2. Anesthetic Topical Lidocaine 4% cream to wound bed prior to debridement 4. Dressing Applied: Other dressing (specify in notes) 5. Secondary Dressing Applied ABD Pad Kerlix/Conform 7. Secured with Tape Notes betadine and silvercel between the 4th and 5th toes Electronic Signature(s) Signed: 02/27/2018 4:04:12 PM By: Roger Shelter Entered By: Roger Shelter on 02/27/2018 13:50:32 [name], [name] ([id number]) -------------------------------------------------------------------------------- Wound Assessment Details Patient Name: [name], [name]. Date of  Service: 02/27/2018 1:45 PM Medical Record Number: [id number] Patient Account Number: [id number] Date of Birth/Sex: [birth year] (52 y.o. F) Treating RN: Roger Shelter Primary Care Hazelyn Kallen: Delight Stare Other Clinician: Referring Randell Detter: Delight Stare Treating Mansel Strother/Extender: STONE III, HOYT Weeks in Treatment: 10 Wound Status Wound Number: 7 Primary Pressure Ulcer Etiology: Wound Location: Right Gluteus Wound Open Wounding Event: Gradually Appeared Status: Date Acquired: 09/18/2016 Comorbid Anemia, Arrhythmia, Coronary Artery Disease, Weeks Of Treatment: 10 History: Hypotension, Peripheral Arterial Disease, Type Clustered Wound: No II Diabetes, Osteoarthritis Photos Photo Uploaded  By: Roger Shelter on 02/27/2018 16:20:35 Wound Measurements Length: (cm) 1 Width: (cm) 0.5 Depth: (cm) 0.4 Area: (cm) 0.393 Volume: (cm) 0.157 % Reduction in Area: 73.7% % Reduction in Volume: 85% Epithelialization: Small (1-33%) Tunneling: No Undermining: Yes Starting Position (o'clock): 3 Ending Position (o'clock): 5 Maximum Distance: (cm) 0.2 Wound Description Classification: Category/Stage IV Wound Margin: Distinct, outline attached Exudate Amount: Large Exudate Type: Serosanguineous Exudate Color: red, brown Foul Odor After Cleansing: No Slough/Fibrino Yes Wound Bed Granulation Amount: Large (67-100%) Exposed Structure Granulation Quality: Red Fat Layer (Subcutaneous Tissue) Exposed: Yes Necrotic Amount: Small (1-33%) Necrotic Quality: Adherent [address] Marvene S. ([id number]) Periwound Skin Texture Texture Color No Abnormalities Noted: No No Abnormalities Noted: No Callus: No Atrophie Blanche: No Crepitus: No Cyanosis: No Excoriation: Yes Ecchymosis: No Induration: No Erythema: No Rash: No Hemosiderin Staining: No Scarring: No Mottled: No Pallor: No Moisture Rubor: No No Abnormalities Noted: No Dry / Scaly: No Temperature / Pain Maceration:  No Temperature: No Abnormality Tenderness on Palpation: Yes Wound Preparation Ulcer Cleansing: Rinsed/Irrigated with Saline Topical Anesthetic Applied: Other: lidocaine, Treatment Notes Wound #7 (Right Gluteus) 1. Cleansed with: Clean wound with Normal Saline 2. Anesthetic Topical Lidocaine 4% cream to wound bed prior to debridement 4. Dressing Applied: Prisma Ag 5. Secondary Dressing Applied Bordered Foam Dressing Notes drawtex Electronic Signature(s) Signed: 02/27/2018 4:04:12 PM By: Roger Shelter Entered By: Roger Shelter on 02/27/2018 13:58:48 Kern, Lille Chauncey Cruel ([id number]) -------------------------------------------------------------------------------- Wound Assessment Details Patient Name: [name]. Date of Service: 02/27/2018 1:45 PM Medical Record Number: [id number] Patient Account Number: [id number] Date of Birth/Sex: Sep 17, 1966 (52 y.o. F) Treating RN: Roger Shelter Primary Care Phelan Goers: Delight Stare Other Clinician: Referring Pearlean Sabina: Delight Stare Treating Vi Biddinger/Extender: STONE III, HOYT Weeks in Treatment: 10 Wound Status Wound Number: 8 Primary Diabetic Wound/Ulcer of the Lower Extremity Etiology: Wound Location: Left Foot - Lateral Wound Open Wounding Event: Gradually Appeared Status: Date Acquired: 11/27/2017 Comorbid Anemia, Arrhythmia, Coronary Artery Disease, Weeks Of Treatment: 10 History: Hypotension, Peripheral Arterial Disease, Type Clustered Wound: No II Diabetes, Osteoarthritis Pending Amputation On Presentation Photos Photo Uploaded By: Roger Shelter on 02/27/2018 16:22:25 Wound Measurements Length: (cm) 11.5 Width: (cm) 2.5 Depth: (cm) 0.1 Area: (cm) 22.58 Volume: (cm) 2.258 % Reduction in Area: 77.5% % Reduction in Volume: 77.5% Epithelialization: None Tunneling: No Undermining: No Wound Description Classification: Grade 2 Wound Margin: Distinct, outline attached Exudate Amount: Medium Exudate Type:  Serous Exudate Color: amber Foul Odor After Cleansing: No Slough/Fibrino Yes Wound Bed Granulation Amount: Medium (34-66%) Exposed Structure Granulation Quality: Red Fascia Exposed: No Necrotic Amount: Medium (34-66%) Fat Layer (Subcutaneous Tissue) Exposed: Yes Necrotic Quality: Eschar, Adherent Slough Tendon Exposed: No Muscle Exposed: No Joint Exposed: No Bone Exposed: No Periwound Skin Texture Dolson, Whitnie S. ([id number]) Texture Color No Abnormalities Noted: No No Abnormalities Noted: No Callus: No Atrophie Blanche: No Crepitus: No Cyanosis: No Excoriation: No Ecchymosis: No Induration: No Erythema: No Rash: No Hemosiderin Staining: No Scarring: No Mottled: No Pallor: No Moisture Rubor: No No Abnormalities Noted: No Dry / Scaly: No Temperature / Pain Maceration: No Temperature: No Abnormality Tenderness on Palpation: Yes Wound Preparation Ulcer Cleansing: Not Cleansed Treatment Notes Wound #8 (Left, Lateral Foot) 1. Cleansed with: Clean wound with Normal Saline 2. Anesthetic Topical Lidocaine 4% cream to wound bed prior to debridement 4. Dressing Applied: Other dressing (specify in notes) 5. Secondary Dressing Applied ABD Pad Kerlix/Conform 7. Secured with Tape Notes betadine and silvercel between the 4th and 5th toes Electronic Signature(s) Signed: 02/27/2018 4:04:12 PM  By: Roger Shelter Entered By: Roger Shelter on 02/27/2018 13:57:54 [place], [name] ([id number]) -------------------------------------------------------------------------------- [place] Details Patient Name: [name]. Date of Service: 02/27/2018 1:45 PM Medical Record Number: [id number] Patient Account Number: [id number] Date of Birth/Sex: 30-Dec-1965 (52 y.o. F) Treating RN: Roger Shelter Primary Care Seva Chancy: Delight Stare Other Clinician: Referring Prabhnoor Ellenberger: Delight Stare Treating Linzy Laury/Extender: STONE III, HOYT Weeks in Treatment: 10 Vital Signs Time Taken:  13:43 Temperature (F): 98.2 Height (in): 67 Pulse (bpm): 83 Respiratory Rate (breaths/min): 18 Blood Pressure (mmHg): 79/54 Reference Range: 80 - 120 mg / dl Electronic Signature(s) Signed: 02/27/2018 4:04:12 PM By: Roger Shelter Entered By: Roger Shelter on 02/27/2018 13:45:06

## 2018-03-13 ENCOUNTER — Encounter: Payer: Medicare Other | Admitting: Physician Assistant

## 2018-03-13 DIAGNOSIS — E11621 Type 2 diabetes mellitus with foot ulcer: Secondary | ICD-10-CM | POA: Diagnosis not present

## 2018-03-16 ENCOUNTER — Encounter: Payer: Self-pay | Admitting: *Deleted

## 2018-03-16 ENCOUNTER — Ambulatory Visit (INDEPENDENT_AMBULATORY_CARE_PROVIDER_SITE_OTHER): Payer: Medicare Other | Admitting: Surgery

## 2018-03-16 ENCOUNTER — Encounter: Payer: Self-pay | Admitting: Surgery

## 2018-03-16 ENCOUNTER — Other Ambulatory Visit: Payer: Self-pay | Admitting: *Deleted

## 2018-03-16 VITALS — BP 96/64 | HR 86 | Temp 97.6°F | Resp 18 | Ht 67.0 in | Wt 160.6 lb

## 2018-03-16 DIAGNOSIS — I7025 Atherosclerosis of native arteries of other extremities with ulceration: Secondary | ICD-10-CM

## 2018-03-16 DIAGNOSIS — T82898D Other specified complication of vascular prosthetic devices, implants and grafts, subsequent encounter: Secondary | ICD-10-CM

## 2018-03-16 NOTE — Progress Notes (Signed)
[facility]  Patient name: [name] MRN: [id number] DOB: 07/10/1966 Sex: female   REASON FOR VISIT:    Follow up  HISOTRY OF PRESENT ILLNESS:     Daralyn S Milesis a 52y.o.femalewho presents for follow-up.  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 She has had multiple interventions for recurrent stenosis. She has also undergone balloon angioplasty of her right subclavian artery for upper extremity numbness.  She has a chronic right leg wound with known osteomyelitis.  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  Because of the infectious issues, the patient's revascularization is been delayed.  Ultimately on 11/28/2017 she underwent an aortobifemoral bypass graft, and thrombectomy and bilateral femoral-popliteal bypass grafts.  She had to go back to the operating room the following day for acute left leg ischemia and had a left popliteal and tibioperoneal trunk endarterectomy with lesser saphenous vein patch.  She was recently in the emergency department because her providers were concerned her wound to gotten worse.  While there she had a CT angiogram which showed that the left femoral-popliteal bypass graft had occluded.  The patient does not feel like there has been any change.  PAST MEDICAL HISTORY:   Past Medical History:  Diagnosis Date  . Arthritis   . Asthma   . Chronic back pain   . Depression   . Diabetes mellitus   . Diabetic neuropathy ([place])   . Family history of adverse reaction to anesthesia    mother had difficlty waking   . 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 GERD (gastroesophageal reflux disease)   . Hyperlipidemia   . Osteomyelitis of right fibula ([place]) 03/05/2017  . PAD (peripheral artery disease) ([place])   . Paroxysmal atrial fibrillation with rapid ventricular response ([place]) 12/02/2017  .  Ulcer    Foot     FAMILY HISTORY:   Family History  Problem Relation Age of Onset  . Coronary artery disease Mother   . Peripheral vascular disease Mother   . Heart disease Mother        Before age 43  . Other Mother        Venous insuffiency  . Diabetes Mother   . Hyperlipidemia Mother   . Hypertension Mother   . Varicose Veins Mother   . Heart attack Mother        before age 66  . Heart disease Father   . Diabetes Father   . Diabetes Maternal Grandmother   . Diabetes Paternal Grandmother   . Diabetes Paternal Grandfather   . Diabetes Sister   . Hypertension Sister   . Diabetes Brother   . Hypertension Brother     SOCIAL HISTORY:   Social History   Tobacco Use  . Smoking status: Former Smoker    Years: 30.00    Types: Cigarettes    Last attempt to quit: 02/07/2017    Years since quitting: 1.1  . Smokeless tobacco: Never Used  Substance Use Topics  . Alcohol use: No    Alcohol/week: 0.0 oz     ALLERGIES:   No Known Allergies   CURRENT MEDICATIONS:   Current Outpatient Medications  Medication Sig Dispense Refill  . albuterol (PROVENTIL) 2 MG tablet Take 2 mg by mouth 3 (three) times daily as needed for shortness of breath.     . cetirizine (ZYRTEC) 10 MG tablet Take 10 mg by mouth daily as needed for  allergies.     Marland Kitchen doxycycline (VIBRA-TABS) 100 MG tablet Take 1 tablet (100 mg total) by mouth 2 (two) times daily. 60 tablet 4  . HUMALOG KWIKPEN 100 UNIT/ML KiwkPen Inject 10 Units into the skin See admin instructions. 10 units three times a day before meals per sliding scale    . HYDROcodone-acetaminophen (NORCO) 10-325 MG tablet Take 1 tablet by mouth 2 (two) times daily.     Marland Kitchen LANTUS SOLOSTAR 100 UNIT/ML Solostar Pen Inject 36 Units into the skin 2 (two) times daily.     . metoprolol tartrate (LOPRESSOR) 25 MG tablet Take 0.5 tablets (12.5 mg total) by mouth 2 (two) times daily. 90 tablet 3  . omeprazole (PRILOSEC) 40 MG capsule Take 40 mg by mouth  daily.    . ondansetron (ZOFRAN ODT) 4 MG disintegrating tablet Take 1 tablet (4 mg total) by mouth every 8 (eight) hours as needed for nausea or vomiting. 20 tablet 0  . pregabalin (LYRICA) 100 MG capsule Limit 1 tab by mouth twice a day to 3 times a day if tolerated (Patient taking differently: Take 100 mg by mouth 3 (three) times daily. ) 90 capsule 0  . rivaroxaban (XARELTO) 20 MG TABS tablet Take 1 tablet (20 mg total) by mouth daily with supper. 30 tablet 2  . rosuvastatin (CRESTOR) 20 MG tablet Take 1 tablet (20 mg total) by mouth daily at 6 PM. 30 tablet 2  . dicyclomine (BENTYL) 10 MG capsule Take 10 mg by mouth 4 (four) times daily as needed for spasms.     No current facility-administered medications for this visit.     REVIEW OF SYSTEMS:   [X]  denotes positive finding, [ ]  denotes negative finding Cardiac  Comments:  Chest pain or chest pressure:    Shortness of breath upon exertion:    Short of breath when lying flat:    Irregular heart rhythm:        Vascular    Pain in calf, thigh, or hip brought on by ambulation:    Pain in feet at night that wakes you up from your sleep:     Blood clot in your veins:    Leg swelling:         Pulmonary    Oxygen at home:    Productive cough:     Wheezing:         Neurologic    Sudden weakness in arms or legs:     Sudden numbness in arms or legs:     Sudden onset of difficulty speaking or slurred speech:    Temporary loss of vision in one eye:     Problems with dizziness:         Gastrointestinal    Blood in stool:     Vomited blood:         Genitourinary    Burning when urinating:     Blood in urine:        Psychiatric    Major depression:         Hematologic    Bleeding problems:    Problems with blood clotting too easily:        Skin    Rashes or ulcers: x       Constitutional    Fever or chills:      PHYSICAL EXAM:   Vitals:   03/16/18 1344  BP: 96/64  Pulse: 86  Resp: 18  Temp: 97.6 F (36.4 C)    TempSrc: Oral  Weight: 160 lb 9.6 oz (72.8 kg)  Height: 5\' 7"  (1.702 m)    GENERAL: The patient is a well-nourished female, in no acute distress. The vital signs are documented above. CARDIAC: There is a regular rate and rhythm.  VASCULAR: Nonpalpable pedal pulses PULMONARY: Non-labored respirations ABDOMEN: Soft and non-tender with normal pitched bowel sounds.  MUSCULOSKELETAL: There are no major deformities or cyanosis. NEUROLOGIC: No focal weakness or paresthesias are detected. 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  Nearly healed right leg ulcer PSYCHIATRIC: The patient has a normal affect.  STUDIES:   None  MEDICAL ISSUES:   The patient is at risk for limb loss of the left leg secondary to an adequate perfusion.  She now has an occluded left femoral-popliteal bypass graft.  I think we can attempt one more time at revascularization.  Prior to doing so, she will need angiography to define her anatomy.  I have scheduled her for abdominal aortogram with bilateral runoff via a left femoral access on Tuesday, May 7.  The following day she will undergo redo revascularization of the left leg.    [name], MD [facility] [phone] Pager [phone]

## 2018-03-16 NOTE — H&P (View-Only) (Signed)
[facility] [place]  Patient name: [name] MRN: [id number] DOB: 01-11-1966 Sex: female   REASON FOR VISIT:    Follow up  HISOTRY OF PRESENT ILLNESS:     [name] Milesis a 51y.o.femalewho presents for follow-up.  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 She has had multiple interventions for recurrent stenosis. She has also undergone balloon angioplasty of her right subclavian artery for upper extremity numbness.  She has a chronic right leg wound with known osteomyelitis.  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  Because of the infectious issues, the patient's revascularization is been delayed.  Ultimately on 11/28/2017 she underwent an aortobifemoral bypass graft, and thrombectomy and bilateral femoral-popliteal bypass grafts.  She had to go back to the operating room the following day for acute left leg ischemia and had a left popliteal and tibioperoneal trunk endarterectomy with lesser saphenous vein patch.  She was recently in the emergency department because her providers were concerned her wound to gotten worse.  While there she had a CT angiogram which showed that the left femoral-popliteal bypass graft had occluded.  The patient does not feel like there has been any change.  PAST MEDICAL HISTORY:   Past Medical History:  Diagnosis Date  . Arthritis   . Asthma   . Chronic back pain   . Depression   . Diabetes mellitus   . Diabetic neuropathy ([place])   . Family history of adverse reaction to anesthesia    mother had difficlty waking   . 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 GERD (gastroesophageal reflux disease)   . Hyperlipidemia   . Osteomyelitis of right fibula ([place]) 03/05/2017  . PAD (peripheral artery disease) ([place])   . Paroxysmal atrial fibrillation with rapid ventricular response ([place]) 12/02/2017  .  Ulcer    Foot     FAMILY HISTORY:   Family History  Problem Relation Age of Onset  . Coronary artery disease Mother   . Peripheral vascular disease Mother   . Heart disease Mother        Before age 25  . Other Mother        Venous insuffiency  . Diabetes Mother   . Hyperlipidemia Mother   . Hypertension Mother   . Varicose Veins Mother   . Heart attack Mother        before age 80  . Heart disease Father   . Diabetes Father   . Diabetes Maternal Grandmother   . Diabetes Paternal Grandmother   . Diabetes Paternal Grandfather   . Diabetes Sister   . Hypertension Sister   . Diabetes Brother   . Hypertension Brother     SOCIAL HISTORY:   Social History   Tobacco Use  . Smoking status: Former Smoker    Years: 30.00    Types: Cigarettes    Last attempt to quit: 02/07/2017    Years since quitting: 1.1  . Smokeless tobacco: Never Used  Substance Use Topics  . Alcohol use: No    Alcohol/week: 0.0 oz     ALLERGIES:   No Known Allergies   CURRENT MEDICATIONS:   Current Outpatient Medications  Medication Sig Dispense Refill  . albuterol (PROVENTIL) 2 MG tablet Take 2 mg by mouth 3 (three) times daily as needed for shortness of breath.     . cetirizine (ZYRTEC) 10 MG tablet Take 10 mg by mouth daily as needed for  allergies.     Marland Kitchen doxycycline (VIBRA-TABS) 100 MG tablet Take 1 tablet (100 mg total) by mouth 2 (two) times daily. 60 tablet 4  . HUMALOG KWIKPEN 100 UNIT/ML KiwkPen Inject 10 Units into the skin See admin instructions. 10 units three times a day before meals per sliding scale    . HYDROcodone-acetaminophen (NORCO) 10-325 MG tablet Take 1 tablet by mouth 2 (two) times daily.     Marland Kitchen LANTUS SOLOSTAR 100 UNIT/ML Solostar Pen Inject 36 Units into the skin 2 (two) times daily.     . metoprolol tartrate (LOPRESSOR) 25 MG tablet Take 0.5 tablets (12.5 mg total) by mouth 2 (two) times daily. 90 tablet 3  . omeprazole (PRILOSEC) 40 MG capsule Take 40 mg by mouth  daily.    . ondansetron (ZOFRAN ODT) 4 MG disintegrating tablet Take 1 tablet (4 mg total) by mouth every 8 (eight) hours as needed for nausea or vomiting. 20 tablet 0  . pregabalin (LYRICA) 100 MG capsule Limit 1 tab by mouth twice a day to 3 times a day if tolerated (Patient taking differently: Take 100 mg by mouth 3 (three) times daily. ) 90 capsule 0  . rivaroxaban (XARELTO) 20 MG TABS tablet Take 1 tablet (20 mg total) by mouth daily with supper. 30 tablet 2  . rosuvastatin (CRESTOR) 20 MG tablet Take 1 tablet (20 mg total) by mouth daily at 6 PM. 30 tablet 2  . dicyclomine (BENTYL) 10 MG capsule Take 10 mg by mouth 4 (four) times daily as needed for spasms.     No current facility-administered medications for this visit.     REVIEW OF SYSTEMS:   [X]  denotes positive finding, [ ]  denotes negative finding Cardiac  Comments:  Chest pain or chest pressure:    Shortness of breath upon exertion:    Short of breath when lying flat:    Irregular heart rhythm:        Vascular    Pain in calf, thigh, or hip brought on by ambulation:    Pain in feet at night that wakes you up from your sleep:     Blood clot in your veins:    Leg swelling:         Pulmonary    Oxygen at home:    Productive cough:     Wheezing:         Neurologic    Sudden weakness in arms or legs:     Sudden numbness in arms or legs:     Sudden onset of difficulty speaking or slurred speech:    Temporary loss of vision in one eye:     Problems with dizziness:         Gastrointestinal    Blood in stool:     Vomited blood:         Genitourinary    Burning when urinating:     Blood in urine:        Psychiatric    Major depression:         Hematologic    Bleeding problems:    Problems with blood clotting too easily:        Skin    Rashes or ulcers: x       Constitutional    Fever or chills:      PHYSICAL EXAM:   Vitals:   03/16/18 1344  BP: 96/64  Pulse: 86  Resp: 18  Temp: 97.6 F (36.4 C)    TempSrc: Oral  Weight: 160 lb 9.6 oz (72.8 kg)  Height: 5\' 7"  (1.702 m)    GENERAL: The patient is a well-nourished female, in no acute distress. The vital signs are documented above. CARDIAC: There is a regular rate and rhythm.  VASCULAR: Nonpalpable pedal pulses PULMONARY: Non-labored respirations ABDOMEN: Soft and non-tender with normal pitched bowel sounds.  MUSCULOSKELETAL: There are no major deformities or cyanosis. NEUROLOGIC: No focal weakness or paresthesias are detected. 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  Nearly healed right leg ulcer PSYCHIATRIC: The patient has a normal affect.  STUDIES:   None  MEDICAL ISSUES:   The patient is at risk for limb loss of the left leg secondary to an adequate perfusion.  She now has an occluded left femoral-popliteal bypass graft.  I think we can attempt one more time at revascularization.  Prior to doing so, she will need angiography to define her anatomy.  I have scheduled her for abdominal aortogram with bilateral runoff via a left femoral access on Tuesday, May 7.  The following day she will undergo redo revascularization of the left leg.    [name], MD [facility] [phone] Pager [phone]

## 2018-03-17 ENCOUNTER — Telehealth: Payer: Self-pay | Admitting: *Deleted

## 2018-03-17 NOTE — Telephone Encounter (Signed)
Call to patient and reviewed all pre-procedure instructions for 5/7 and 5/10. Will mail letter of instruction for both to patient. Patient confirms that she is no longer taking Xarelto but is on Aspirin and instructed to continue. To call this office for any questions. Verbalized understanding.

## 2018-03-17 NOTE — Progress Notes (Signed)
[name], [name] ([id number]) Visit Report for 03/13/2018 Arrival Information Details Patient Name: [name], [name]. Date of Service: 03/13/2018 1:30 PM Medical Record Number: [id number] Patient Account Number: [id number] Date of Birth/Sex: [birth year] (52 y.o. F) Treating [name]: Roger Shelter Primary Care Kamri Gotsch: Delight Stare Other Clinician: Referring Mckinleigh Schuchart: Delight Stare Treating Conchita Truxillo/Extender: Melburn Hake, HOYT Weeks in Treatment: 12 Visit Information History Since Last Visit All ordered tests and consults were completed: No Patient Arrived: Walker Added or deleted any medications: No Arrival Time: 13:39 Any new allergies or adverse reactions: No Accompanied By: self Had a fall or experienced change in No Transfer Assistance: None activities of daily living that may affect Patient Identification Verified: Yes risk of falls: Secondary Verification Process Yes Signs or symptoms of abuse/neglect since last visito No Completed: Hospitalized since last visit: No Patient Requires Transmission-Based No Implantable device outside of the clinic excluding No Precautions: cellular tissue based products placed in the center Patient Has Alerts: Yes since last visit: Patient Alerts: Patient on Blood Pain Present Now: No Thinner DM II Xarelto Electronic Signature(s) Signed: 03/13/2018 4:07:12 PM By: Roger Shelter Entered By: Roger Shelter on 03/13/2018 13:43:16 [name], [name] ([id number]) -------------------------------------------------------------------------------- Clinic Level of Care Assessment Details Patient Name: [name], [name]. Date of Service: 03/13/2018 1:30 PM Medical Record Number: [id number] Patient Account Number: [id number] Date of Birth/Sex: [birth year] (52 y.o. F) Treating [name]: Montey Hora Primary Care Zenda Herskowitz: Delight Stare Other Clinician: Referring Caedence Snowden: Delight Stare Treating Hanalei Glace/Extender: Melburn Hake, HOYT Weeks in Treatment: 12 Clinic  Level of Care Assessment Items TOOL 4 Quantity Score []  - Use when only an EandM is performed on FOLLOW-UP visit 0 ASSESSMENTS - Nursing Assessment / Reassessment X - Reassessment of Co-morbidities (includes updates in patient status) 1 10 X- 1 5 Reassessment of Adherence to Treatment Plan ASSESSMENTS - Wound and Skin Assessment / Reassessment []  - Simple Wound Assessment / Reassessment - one wound 0 X- 6 5 Complex Wound Assessment / Reassessment - multiple wounds []  - 0 Dermatologic / Skin Assessment (not related to wound area) ASSESSMENTS - Focused Assessment []  - Circumferential Edema Measurements - multi extremities 0 []  - 0 Nutritional Assessment / Counseling / Intervention X- 1 5 Lower Extremity Assessment (monofilament, tuning fork, pulses) X- 1 10 Peripheral Arterial Disease Assessment (using hand held doppler) ASSESSMENTS - Ostomy and/or Continence Assessment and Care []  - Incontinence Assessment and Management 0 []  - 0 Ostomy Care Assessment and Management (repouching, etc.) PROCESS - Coordination of Care X - Simple Patient / Family Education for ongoing care 1 15 []  - 0 Complex (extensive) Patient / Family Education for ongoing care []  - 0 Staff obtains Programmer, systems, Records, Test Results / Process Orders []  - 0 Staff telephones HHA, Nursing Homes / Clarify orders / etc []  - 0 Routine Transfer to another Facility (non-emergent condition) []  - 0 Routine Hospital Admission (non-emergent condition) []  - 0 New Admissions / Biomedical engineer / Ordering NPWT, Apligraf, etc. []  - 0 Emergency Hospital Admission (emergent condition) X- 1 10 Simple Discharge Coordination Zenker, Jahne S. ([id number]) []  - 0 Complex (extensive) Discharge Coordination PROCESS - Special Needs []  - Pediatric / Minor Patient Management 0 []  - 0 Isolation Patient Management []  - 0 Hearing / Language / Visual special needs []  - 0 Assessment of Community assistance (transportation, D/C  planning, etc.) []  - 0 Additional assistance / Altered mentation []  - 0 Support Surface(s) Assessment (bed, cushion, seat, etc.) INTERVENTIONS - Wound Cleansing / Measurement []  - Simple Wound Cleansing -  one wound 0 X- 6 5 Complex Wound Cleansing - multiple wounds X- 1 5 Wound Imaging (photographs - any number of wounds) []  - 0 Wound Tracing (instead of photographs) []  - 0 Simple Wound Measurement - one wound X- 6 5 Complex Wound Measurement - multiple wounds INTERVENTIONS - Wound Dressings X - Small Wound Dressing one or multiple wounds 6 10 []  - 0 Medium Wound Dressing one or multiple wounds []  - 0 Large Wound Dressing one or multiple wounds []  - 0 Application of Medications - topical []  - 0 Application of Medications - injection INTERVENTIONS - Miscellaneous []  - External ear exam 0 []  - 0 Specimen Collection (cultures, biopsies, blood, body fluids, etc.) []  - 0 Specimen(s) / Culture(s) sent or taken to Lab for analysis []  - 0 Patient Transfer (multiple staff / Civil Service fast streamer / Similar devices) []  - 0 Simple Staple / Suture removal (25 or less) []  - 0 Complex Staple / Suture removal (26 or more) []  - 0 Hypo / Hyperglycemic Management (close monitor of Blood Glucose) []  - 0 Ankle / Brachial Index (ABI) - do not check if billed separately X- 1 5 Vital Signs [name], [name] ([id number]) Has the patient been seen at the hospital within the last three years: Yes Total Score: 215 Level Of Care: New/Established - Level 5 Electronic Signature(s) Signed: 03/13/2018 4:37:07 PM By: Montey Hora Entered By: Montey Hora on 03/13/2018 14:30:05 [name], [name] ([id number]) -------------------------------------------------------------------------------- Encounter Discharge Information Details Patient Name: Jamie Martyr. Date of Service: 03/13/2018 1:30 PM Medical Record Number: [id number] Patient Account Number: [id number] Date of Birth/Sex: 04-07-1966 (52 y.o.  F) Treating [name]: Ahmed Prima Primary Care Rondo Spittler: Delight Stare Other Clinician: Referring Aaryana Betke: Delight Stare Treating Amorette Charrette/Extender: Melburn Hake, HOYT Weeks in Treatment: 12 Encounter Discharge Information Items Discharge Pain Level: 0 Discharge Condition: Stable Ambulatory Status: Walker Discharge Destination: Home Private Transportation: Auto Accompanied By: husband Schedule Follow-up Appointment: Yes Medication Reconciliation completed and provided No to Patient/Care Nilton Lave: Clinical Summary of Care: Electronic Signature(s) Signed: 03/13/2018 2:47:27 PM By: Alric Quan Entered By: Alric Quan on 03/13/2018 14:47:27 [name], [name] ([id number]) -------------------------------------------------------------------------------- Lower Extremity Assessment Details Patient Name: Jamie Martyr. Date of Service: 03/13/2018 1:30 PM Medical Record Number: [id number] Patient Account Number: [id number] Date of Birth/Sex: 03-Oct-1966 (52 y.o. F) Treating [name]: Roger Shelter Primary Care Seanpatrick Maisano: Delight Stare Other Clinician: Referring Kristine Tiley: Delight Stare Treating Vincient Vanaman/Extender: STONE III, HOYT Weeks in Treatment: 12 Edema Assessment Assessed: [Left: No] [Right: No] Edema: [Left: No] [Right: No] Vascular Assessment Claudication: Claudication Assessment [Left:None] [Right:None] Pulses: Dorsalis Pedis Palpable: [Left:No] [Right:Yes] Doppler Audible: [Left:Inaudible] Posterior Tibial Palpable: [Left:No] Doppler Audible: [Left:Inaudible] Extremity colors, hair growth, and conditions: Extremity Color: [Left:Pale] [Right:Normal] Hair Growth on Extremity: [Left:No] [Right:No] Temperature of Extremity: [Left:Warm] [Right:Warm] Toe Nail Assessment Left: Right: Thick: Yes Yes Discolored: Yes Yes Deformed: Yes Yes Improper Length and Hygiene: Yes Yes Notes 4th and 5th toes are grey, foot is reddish purple Electronic Signature(s) Signed: 03/13/2018  4:07:12 PM By: Roger Shelter Signed: 03/13/2018 4:37:07 PM By: Montey Hora Entered By: Montey Hora on 03/13/2018 14:29:16 [name], [name] ([id number]) -------------------------------------------------------------------------------- Multi Wound Chart Details Patient Name: Jamie Martyr. Date of Service: 03/13/2018 1:30 PM Medical Record Number: [id number] Patient Account Number: [id number] Date of Birth/Sex: [birth year] (52 y.o. F) Treating [name]: Montey Hora Primary Care Gilberta Peeters: Delight Stare Other Clinician: Referring Kristan Votta: Delight Stare Treating Rhys Lichty/Extender: STONE III, HOYT Weeks in Treatment: 12 Vital Signs Height(in): 67 Pulse(bpm): 85 Weight(lbs): Blood Pressure(mmHg): 76/53 Body  Mass Index(BMI): Temperature(F): Respiratory Rate 18 (breaths/min): Photos: [10:No Photos] [3:No Photos] [5:No Photos] Wound Location: [10:Left Toe Fifth - Circumfernential] [3:Right Malleolus - Lateral] [5:Left Toe Great - Circumfernential] Wounding Event: [10:Gradually Appeared] [3:Pressure Injury] [5:Gradually Appeared] 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] [3:Diabetic Wound/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] [5:Lower Extremity] Comorbid History: [10:Anemia, Arrhythmia, Coronary Artery Disease, Hypotension, Peripheral Arterial Disease, Type II Diabetes, Osteoarthritis] [3:Anemia, Arrhythmia, Coronary Anemia, Arrhythmia, Coronary Artery Disease, Hypotension, Artery Disease,  Hypotension, Peripheral Arterial Disease, Peripheral Arterial Disease, Type II Diabetes, Osteoarthritis] [5:Type II Diabetes, Osteoarthritis] Date Acquired: [10:11/27/2017] [3:09/23/2016] [5:11/27/2017] Weeks of Treatment: [10:10] [3:12] [5:12] Wound Status: [10:Open] [3:Open] [5:Open] Pending Amputation on [10:No] [3:No] [5:Yes] Presentation: Measurements L x W x D [10:3.2x4.2x0.1] [3:0.1x0.1x0.1] [5:5.8x6.5x0.1] (cm) Area (cm) : [10:10.556] [3:0.008]  [5:29.61] Volume (cm) : [10:1.056] [3:0.001] [5:2.961] % Reduction in Area: [10:-12.00%] [3:99.10%] [5:1.40%] % Reduction in Volume: [10:-12.10%] [3:99.40%] [5:1.40%] Classification: [10:Grade 1] [3:Grade 2] [5:Grade 2] Exudate Amount: [10:None Present] [3:None Present] [5:None Present] Exudate Type: [10:N/A] [3:N/A] [5:N/A] Exudate Color: [10:N/A] [3:N/A] [5:N/A] Wound Margin: [10:Distinct, outline attached] [3:Distinct, outline attached] [5:Distinct, outline attached] Granulation Amount: [10:None Present (0%)] [3:Medium (34-66%)] [5:None Present (0%)] Granulation Quality: [10:N/A] [3:Pink] [5:N/A] Necrotic Amount: [10:Large (67-100%)] [3:Medium (34-66%)] [5:Large (67-100%)] Necrotic Tissue: [10:Eschar] [3:Eschar, Adherent Slough] [5:Eschar] Exposed Structures: [10:Fascia: No Fat Layer (Subcutaneous Tissue) Exposed: No Tendon: No Muscle: No] [3:Fat Layer (Subcutaneous Tissue) Exposed: Yes Fascia: No Tendon: No Muscle: No] [5:N/A] Joint: No Joint: No Bone: No Bone: No Epithelialization: Large (67-100%) Large (67-100%) None Periwound Skin Texture: Excoriation: No Excoriation: No Excoriation: No Induration: No Induration: No Induration: No Callus: No Callus: No Callus: No Crepitus: No Crepitus: No Crepitus: No Rash: No Rash: No Rash: No Scarring: No Scarring: No Scarring: No Periwound Skin Moisture: Maceration: No Maceration: No Maceration: No Dry/Scaly: No Dry/Scaly: No Dry/Scaly: No Periwound Skin Color: Mottled: Yes Atrophie Blanche: No Atrophie Blanche: No Atrophie Blanche: No Cyanosis: No Cyanosis: No Cyanosis: No Ecchymosis: No Ecchymosis: No Ecchymosis: No Erythema: No Erythema: No Erythema: No Hemosiderin Staining: No Hemosiderin Staining: No Hemosiderin Staining: No Mottled: No Mottled: No Pallor: No Pallor: No Pallor: No Rubor: No Rubor: No Rubor: No Temperature: No Abnormality No Abnormality No Abnormality Tenderness on Palpation: No  No No Wound Preparation: Ulcer Cleansing: Ulcer Cleansing: Ulcer Cleansing: Rinsed/Irrigated with Saline Rinsed/Irrigated with Saline Rinsed/Irrigated with Saline Topical Anesthetic Applied: Topical Anesthetic Applied: Topical Anesthetic Applied: None None None Wound Number: [id number]  Photos: No Photos No Photos No Photos Wound Location: Left Toe Fourth - Right Gluteus Left Foot - Lateral Circumfernential Wounding Event: Gradually Appeared Gradually Appeared Gradually Appeared 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 Comorbid History: Anemia, Arrhythmia, Coronary Anemia, Arrhythmia, Coronary Anemia, Arrhythmia, Coronary Artery Disease, Hypotension, Artery Disease, Hypotension, Artery Disease, Hypotension, Peripheral Arterial Disease, Peripheral Arterial Disease, Peripheral Arterial Disease, Type II Diabetes, Type II Diabetes, Type II Diabetes, Osteoarthritis Osteoarthritis Osteoarthritis Date Acquired: 11/27/2017 09/18/2016 11/27/2017 Weeks of Treatment: 12 12 12  Wound Status: Open Open Open Pending Amputation on Yes No Yes Presentation: Measurements L x W x D 1.8x3.4x0.1 1.6x0.6x0.5 12.7x3.2x0.1 (cm) Area (cm) : 4.807 0.754 31.919 Volume (cm) : 0.481 0.377 3.192 % Reduction in Area: -2.00% 49.50% 68.20% % Reduction in Volume: -2.10% 63.90% 68.20% 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 Exudate Amount: None Present Large Medium Exudate Type: N/A Serosanguineous Serous Exudate Color: N/A red, brown amber Wound Margin: Distinct, outline attached Distinct,  outline attached Distinct, outline attached Granulation Amount: None Present (0%) Large (67-100%) Medium (34-66%) Granulation Quality: N/A Red Red Necrotic Amount: Large (67-100%) Small (1-33%) Medium (34-66%) Paulette, Lisa S. ([id number]) Necrotic Tissue: Eschar Adherent Slough Eschar, Adherent Slough Exposed Structures: N/A Fat Layer  (Subcutaneous Fat Layer (Subcutaneous Tissue) Exposed: Yes Tissue) Exposed: Yes Fascia: No Tendon: No Muscle: No Joint: No Bone: No Epithelialization: None Small (1-33%) None Periwound Skin Texture: Excoriation: No Excoriation: Yes Excoriation: No Induration: No Induration: No Induration: No Callus: No Callus: No Callus: No Crepitus: No Crepitus: No Crepitus: No Rash: No Rash: No Rash: No Scarring: No Scarring: No Scarring: No Periwound Skin Moisture: Maceration: No Maceration: No Maceration: No Dry/Scaly: No Dry/Scaly: No Dry/Scaly: No Periwound Skin Color: Atrophie Blanche: No Atrophie Blanche: No Atrophie Blanche: No Cyanosis: No Cyanosis: No Cyanosis: No Ecchymosis: No Ecchymosis: No Ecchymosis: No Erythema: No Erythema: No Erythema: No Hemosiderin Staining: No Hemosiderin Staining: No Hemosiderin Staining: No Mottled: No Mottled: No Mottled: No Pallor: No Pallor: No Pallor: No Rubor: No Rubor: No Rubor: No Temperature: No Abnormality No Abnormality No Abnormality Tenderness on Palpation: No Yes Yes Wound Preparation: Ulcer Cleansing: Ulcer Cleansing: Ulcer Cleansing: Not Cleansed Rinsed/Irrigated with Saline Rinsed/Irrigated with Saline Topical Anesthetic Applied: Topical Anesthetic Applied: None Other: lidocaine Treatment Notes Electronic Signature(s) Signed: 03/13/2018 4:37:07 PM By: Montey Hora Entered By: Montey Hora on 03/13/2018 14:29:28 [name], [name] Chauncey Cruel ([id number]) -------------------------------------------------------------------------------- [place] Details Patient Name: SHARMIN, FOULK. Date of Service: 03/13/2018 1:30 PM Medical Record Number: [id number] Patient Account Number: [id number] Date of Birth/Sex: 02-03-1966 (52 y.o. F) Treating [name]: Montey Hora Primary Care Irisa Grimsley: Delight Stare Other Clinician: Referring Chee Kinslow: Delight Stare Treating Gail Vendetti/Extender: STONE III, HOYT Weeks in  Treatment: 12 Active Inactive ` Abuse / Safety / Falls / Self Care Management Nursing Diagnoses: Potential for falls Goals: Patient will not experience any injury related to falls Date Initiated: 12/18/2017 Target Resolution Date: 02/21/2018 Goal Status: Active Interventions: Assess Activities of Daily Living upon admission and as needed Assess fall risk on admission and as needed Assess: immobility, friction, shearing, incontinence upon admission and as needed Assess impairment of mobility on admission and as needed per policy Assess personal safety and home safety (as indicated) on admission and as needed Assess self care needs on admission and as needed Provide education on basic hygiene Provide education on fall prevention Treatment Activities: Education provided on Basic Hygiene : 12/18/2017 Notes: ` Necrotic Tissue Nursing Diagnoses: Impaired tissue integrity related to necrotic/devitalized tissue Knowledge deficit related to management of necrotic/devitalized tissue Goals: Patient/caregiver will verbalize understanding of reason and process for debridement of necrotic tissue Date Initiated: 12/18/2017 Target Resolution Date: 03/28/2018 Goal Status: Active Interventions: Assess patient pain level pre-, during and post procedure and prior to discharge Provide education on necrotic tissue and debridement process Notes: MYLEKA, MONCURE ([id number]) ` Nutrition Nursing Diagnoses: Imbalanced nutrition Impaired glucose control: actual or potential Potential for alteratiion in Nutrition/Potential for imbalanced nutrition Goals: Patient/caregiver verbalizes understanding of need to maintain therapeutic glucose control per primary care physician Date Initiated: 12/18/2017 Target Resolution Date: 02/21/2018 Goal Status: Active Interventions: Assess patient nutrition upon admission and as needed per policy Notes: ` Orientation to the Wound Care Program Nursing  Diagnoses: Knowledge deficit related to the wound healing center program Goals: Patient/caregiver will verbalize understanding of the [facility] Program Date Initiated: 12/18/2017 Target Resolution Date: 12/27/2017 Goal Status: Active Interventions: Provide education on orientation to the wound center Notes: ` Pain, Acute or Chronic Nursing  Diagnoses: Pain, acute or chronic: actual or potential Potential alteration in comfort, pain Goals: Patient/caregiver will verbalize adequate pain control between visits Date Initiated: 12/18/2017 Target Resolution Date: 03/28/2018 Goal Status: Active Interventions: Complete pain assessment as per visit requirements Notes: ` Wound/Skin Impairment LASHANA, SPANG ([id number]) Nursing Diagnoses: Impaired tissue integrity Knowledge deficit related to ulceration/compromised skin integrity Goals: Ulcer/skin breakdown will have a volume reduction of 80% by week 12 Date Initiated: 12/18/2017 Target Resolution Date: 03/21/2018 Goal Status: Active Interventions: Assess patient/caregiver ability to perform ulcer/skin care regimen upon admission and as needed Assess ulceration(s) every visit Notes: Electronic Signature(s) Signed: 03/13/2018 4:37:07 PM By: Montey Hora Entered By: Montey Hora on 03/13/2018 14:25:09 Hearty, Kamille S. ([id number]) -------------------------------------------------------------------------------- Pain Assessment Details Patient Name: Jamie Martyr. Date of Service: 03/13/2018 1:30 PM Medical Record Number: [id number] Patient Account Number: [id number] Date of Birth/Sex: [birth year] (52 y.o. F) Treating [name]: Roger Shelter Primary Care Nakeisha Greenhouse: Delight Stare Other Clinician: Referring Jiovanny Burdell: Delight Stare Treating Grayson Pfefferle/Extender: STONE III, HOYT Weeks in Treatment: 12 Active Problems Location of Pain Severity and Description of Pain Patient Has Paino No Site Locations Pain Management and  Medication Current Pain Management: Electronic Signature(s) Signed: 03/13/2018 4:07:12 PM By: Roger Shelter Entered By: Roger Shelter on 03/13/2018 13:43:25 Sherburn, [name] ([id number]) -------------------------------------------------------------------------------- Patient/Caregiver Education Details Patient Name: Jamie Martyr. Date of Service: 03/13/2018 1:30 PM Medical Record Number: [id number] Patient Account Number: [id number] Date of Birth/Gender: 20-Sep-1966 (52 y.o. F) Treating [name]: Ahmed Prima Primary Care Physician: Delight Stare Other Clinician: Referring Physician: Delight Stare Treating Physician/Extender: Sharalyn Ink in Treatment: 12 Education Assessment Education Provided To: Patient Education Topics Provided Wound/Skin Impairment: Handouts: Caring for Your Ulcer, Other: change dressing as ordered Methods: Demonstration, Explain/Verbal Responses: State content correctly Electronic Signature(s) Signed: 03/13/2018 4:29:16 PM By: Alric Quan Entered By: Alric Quan on 03/13/2018 14:47:45 Speyer, Tinisha S. ([id number]) -------------------------------------------------------------------------------- Wound Assessment Details Patient Name: Jamie Martyr. Date of Service: 03/13/2018 1:30 PM Medical Record Number: [id number] Patient Account Number: [id number] Date of Birth/Sex: 10-08-1966 (52 y.o. F) Treating [name]: Roger Shelter Primary Care Malaiyah Achorn: Delight Stare Other Clinician: Referring Preslee Regas: Delight Stare Treating Evaristo Tsuda/Extender: STONE III, HOYT Weeks in Treatment: 12 Wound Status Wound Number: 10 Primary Diabetic Wound/Ulcer of the Lower Extremity Etiology: Wound Location: Left Toe Fifth - Circumfernential Wound Open Wounding Event: Gradually Appeared Status: Date Acquired: 11/27/2017 Comorbid Anemia, Arrhythmia, Coronary Artery Disease, Weeks Of Treatment: 10 History: Hypotension, Peripheral Arterial Disease, Type Clustered  Wound: No II Diabetes, Osteoarthritis Photos Photo Uploaded By: [name], [name], [name], [name], [name] on 03/13/2018 16:05:22 Wound Measurements Length: (cm) 3.2 Width: (cm) 4.2 Depth: (cm) 0.1 Area: (cm) 10.556 Volume: (cm) 1.056 % Reduction in Area: -12% % Reduction in Volume: -12.1% Epithelialization: Large (67-100%) Tunneling: No Undermining: No Wound Description Classification: Grade 1 Wound Margin: Distinct, outline attached Exudate Amount: None Present Foul Odor After Cleansing: No Slough/Fibrino Yes Wound Bed Granulation Amount: None Present (0%) Exposed Structure Necrotic Amount: Large (67-100%) Fascia Exposed: No Necrotic Quality: Eschar Fat Layer (Subcutaneous Tissue) Exposed: No Tendon Exposed: No Muscle Exposed: No Joint Exposed: No Bone Exposed: No Periwound Skin Texture Texture Color No Abnormalities Noted: No No Abnormalities Noted: No Friesen, Verdell S. ([id number]) Callus: No Atrophie Blanche: No Crepitus: No Cyanosis: No Excoriation: No Ecchymosis: No Induration: No Erythema: No Rash: No Hemosiderin Staining: No Scarring: No Mottled: Yes Pallor: No Moisture Rubor: No No Abnormalities Noted: No Dry / Scaly: No Temperature / Pain Maceration: No Temperature: No Abnormality Wound Preparation Ulcer  Cleansing: Rinsed/Irrigated with Saline Topical Anesthetic Applied: None Treatment Notes Wound #10 (Left, Circumferential Toe Fifth) 1. Cleansed with: Clean wound with Normal Saline 2. Anesthetic Topical Lidocaine 4% cream to wound bed prior to debridement 4. Dressing Applied: Other dressing (specify in notes) 5. Secondary Dressing Applied ABD Pad Kerlix/Conform 7. Secured with Tape Notes betadine paint Electronic Signature(s) Signed: 03/13/2018 4:07:12 PM By: Roger Shelter Entered By: Roger Shelter on 03/13/2018 13:59:55 Standifer, [name] ([id number]) -------------------------------------------------------------------------------- Wound  Assessment Details Patient Name: DORITA, ROWLANDS S. Date of Service: 03/13/2018 1:30 PM Medical Record Number: [id number] Patient Account Number: [id number] Date of Birth/Sex: August 11, 1966 (52 y.o. F) Treating [name]: Roger Shelter Primary Care Weslyn Holsonback: Delight Stare Other Clinician: Referring Slaton Reaser: Delight Stare Treating Caryn Gienger/Extender: STONE III, HOYT Weeks in Treatment: 12 Wound Status Wound Number: 3 Primary Diabetic Wound/Ulcer of the Lower Extremity Etiology: Wound Location: Right Malleolus - Lateral Wound Open Wounding Event: Pressure Injury Status: Date Acquired: 09/23/2016 Comorbid Anemia, Arrhythmia, Coronary Artery Disease, Weeks Of Treatment: 12 History: Hypotension, Peripheral Arterial Disease, Type Clustered Wound: No II Diabetes, Osteoarthritis Photos Photo Uploaded By: [name], [name], [name], [name], [name] on 03/13/2018 16:07:27 Wound Measurements Length: (cm) 0.1 Width: (cm) 0.1 Depth: (cm) 0.1 Area: (cm) 0.008 Volume: (cm) 0.001 % Reduction in Area: 99.1% % Reduction in Volume: 99.4% Epithelialization: Large (67-100%) Tunneling: No Undermining: No Wound Description Classification: Grade 2 Wound Margin: Distinct, outline attached Exudate Amount: None Present Foul Odor After Cleansing: No Slough/Fibrino Yes Wound Bed Granulation Amount: Medium (34-66%) Exposed Structure Granulation Quality: Pink Fascia Exposed: No Necrotic Amount: Medium (34-66%) Fat Layer (Subcutaneous Tissue) Exposed: Yes Necrotic Quality: Eschar, Adherent Slough Tendon Exposed: No Muscle Exposed: No Joint Exposed: No Bone Exposed: No Periwound Skin Texture Texture Color No Abnormalities Noted: No No Abnormalities Noted: No Lookingbill, Lilyana S. ([id number]) Callus: No Atrophie Blanche: No Crepitus: No Cyanosis: No Excoriation: No Ecchymosis: No Induration: No Erythema: No Rash: No Hemosiderin Staining: No Scarring: No Mottled: No Pallor: No Moisture Rubor: No No Abnormalities  Noted: No Dry / Scaly: No Temperature / Pain Maceration: No Temperature: No Abnormality Wound Preparation Ulcer Cleansing: Rinsed/Irrigated with Saline Topical Anesthetic Applied: None Treatment Notes Wound #3 (Right, Lateral Malleolus) 1. Cleansed with: Clean wound with Normal Saline 2. Anesthetic Topical Lidocaine 4% cream to wound bed prior to debridement 4. Dressing Applied: Other dressing (specify in notes) 5. Secondary Dressing Applied ABD Pad Kerlix/Conform 7. Secured with Tape Notes betadine paint Electronic Signature(s) Signed: 03/13/2018 4:07:12 PM By: Roger Shelter Entered By: Roger Shelter on 03/13/2018 14:00:58 Kalina, [name] ([id number]) -------------------------------------------------------------------------------- Wound Assessment Details Patient Name: NARYAH, CLENNEY. Date of Service: 03/13/2018 1:30 PM Medical Record Number: [id number] Patient Account Number: [id number] Date of Birth/Sex: [birth year] (52 y.o. F) Treating [name]: Roger Shelter Primary Care Fianna Snowball: Delight Stare Other Clinician: Referring Maliek Schellhorn: Delight Stare Treating Naquisha Whitehair/Extender: STONE III, HOYT Weeks in Treatment: 12 Wound Status Wound Number: 5 Primary Diabetic Wound/Ulcer of the Lower Extremity Etiology: Wound Location: Left Toe Great - Circumfernential Wound Open Wounding Event: Gradually Appeared Status: Date Acquired: 11/27/2017 Comorbid Anemia, Arrhythmia, Coronary Artery Disease, Weeks Of Treatment: 12 History: Hypotension, Peripheral Arterial Disease, Type Clustered Wound: No II Diabetes, Osteoarthritis Pending Amputation On Presentation Photos Photo Uploaded By: [name], [name], [name], [name], [name] on 03/13/2018 16:04:29 Wound Measurements Length: (cm) 5.8 Width: (cm) 6.5 Depth: (cm) 0.1 Area: (cm) 29.61 Volume: (cm) 2.961 % Reduction in Area: 1.4% % Reduction in Volume: 1.4% Epithelialization: None Tunneling: No Undermining: No Wound  Description Classification: Grade 2 Wound Margin:  Distinct, outline attached Exudate Amount: None Present Foul Odor After Cleansing: No Slough/Fibrino Yes Wound Bed Granulation Amount: None Present (0%) Necrotic Amount: Large (67-100%) Necrotic Quality: Eschar Periwound Skin Texture Texture Color No Abnormalities Noted: No No Abnormalities Noted: No Callus: No Atrophie Blanche: No Crepitus: No Cyanosis: No Excoriation: No Ecchymosis: No Yarbro, Nuria S. ([id number]) Induration: No Erythema: No Rash: No Hemosiderin Staining: No Scarring: No Mottled: No Pallor: No Moisture Rubor: No No Abnormalities Noted: No Dry / Scaly: No Temperature / Pain Maceration: No Temperature: No Abnormality Wound Preparation Ulcer Cleansing: Rinsed/Irrigated with Saline Topical Anesthetic Applied: None Treatment Notes Wound #5 (Left, Circumferential Toe Great) 1. Cleansed with: Clean wound with Normal Saline 2. Anesthetic Topical Lidocaine 4% cream to wound bed prior to debridement 4. Dressing Applied: Other dressing (specify in notes) 5. Secondary Dressing Applied ABD Pad Kerlix/Conform 7. Secured with Tape Notes betadine paint Electronic Signature(s) Signed: 03/13/2018 4:07:12 PM By: Roger Shelter Entered By: Roger Shelter on 03/13/2018 14:01:44 Worrall, Golf. ([id number]) -------------------------------------------------------------------------------- Wound Assessment Details Patient Name: ARACELYS, GLADE. Date of Service: 03/13/2018 1:30 PM Medical Record Number: [id number] Patient Account Number: [id number] Date of Birth/Sex: 1966/02/18 (52 y.o. F) Treating [name]: Roger Shelter Primary Care Dee Paden: Delight Stare Other Clinician: Referring Michaell Grider: Delight Stare Treating Jaxton Casale/Extender: STONE III, HOYT Weeks in Treatment: 12 Wound Status Wound Number: 6 Primary Diabetic Wound/Ulcer of the Lower Extremity Etiology: Wound Location: Left Toe Fourth -  Circumfernential Wound Open Wounding Event: Gradually Appeared Status: Date Acquired: 11/27/2017 Comorbid Anemia, Arrhythmia, Coronary Artery Disease, Weeks Of Treatment: 12 History: Hypotension, Peripheral Arterial Disease, Type Clustered Wound: No II Diabetes, Osteoarthritis Pending Amputation On Presentation Photos Photo Uploaded By: [name], [name], [name], [name], [name] on 03/13/2018 16:05:22 Wound Measurements Length: (cm) 1.8 Width: (cm) 3.4 Depth: (cm) 0.1 Area: (cm) 4.807 Volume: (cm) 0.481 % Reduction in Area: -2% % Reduction in Volume: -2.1% Epithelialization: None Tunneling: No Undermining: No Wound Description Classification: Grade 2 Wound Margin: Distinct, outline attached Exudate Amount: None Present Foul Odor After Cleansing: No Slough/Fibrino Yes Wound Bed Granulation Amount: None Present (0%) Necrotic Amount: Large (67-100%) Necrotic Quality: Eschar Periwound Skin Texture Texture Color No Abnormalities Noted: No No Abnormalities Noted: No Callus: No Atrophie Blanche: No Crepitus: No Cyanosis: No Excoriation: No Ecchymosis: No Ciliberto, Jamisyn S. ([id number]) Induration: No Erythema: No Rash: No Hemosiderin Staining: No Scarring: No Mottled: No Pallor: No Moisture Rubor: No No Abnormalities Noted: No Dry / Scaly: No Temperature / Pain Maceration: No Temperature: No Abnormality Wound Preparation Ulcer Cleansing: Rinsed/Irrigated with Saline Topical Anesthetic Applied: None Treatment Notes Wound #6 (Left, Circumferential Toe Fourth) 1. Cleansed with: Clean wound with Normal Saline 2. Anesthetic Topical Lidocaine 4% cream to wound bed prior to debridement 4. Dressing Applied: Other dressing (specify in notes) 5. Secondary Dressing Applied ABD Pad Kerlix/Conform 7. Secured with Tape Notes betadine paint Electronic Signature(s) Signed: 03/13/2018 4:07:12 PM By: Roger Shelter Entered By: Roger Shelter on 03/13/2018 14:04:10 Patchell, Brigett  S. ([id number]) -------------------------------------------------------------------------------- Wound Assessment Details Patient Name: NATALLIE, RAVENSCROFT S. Date of Service: 03/13/2018 1:30 PM Medical Record Number: [id number] Patient Account Number: [id number] Date of Birth/Sex: 1966-05-10 (52 y.o. F) Treating [name]: Roger Shelter Primary Care Jocabed Cheese: Delight Stare Other Clinician: Referring Daelon Dunivan: Delight Stare Treating Jordanne Elsbury/Extender: STONE III, HOYT Weeks in Treatment: 12 Wound Status Wound Number: 7 Primary Pressure Ulcer Etiology: Wound Location: Right Gluteus Wound Open Wounding Event: Gradually Appeared Status: Date Acquired: 09/18/2016 Comorbid Anemia, Arrhythmia, Coronary Artery Disease, Weeks Of Treatment: 12 History: Hypotension, Peripheral  Arterial Disease, Type Clustered Wound: No II Diabetes, Osteoarthritis Photos Photo Uploaded By: [name], [name], [name], [name], [name] on 03/13/2018 16:05:56 Wound Measurements Length: (cm) 1.6 Width: (cm) 0.6 Depth: (cm) 0.5 Area: (cm) 0.754 Volume: (cm) 0.377 % Reduction in Area: 49.5% % Reduction in Volume: 63.9% Epithelialization: Small (1-33%) Tunneling: No Undermining: No Wound Description Classification: Category/Stage IV Wound Margin: Distinct, outline attached Exudate Amount: Large Exudate Type: Serosanguineous Exudate Color: red, brown Foul Odor After Cleansing: No Slough/Fibrino Yes Wound Bed Granulation Amount: Large (67-100%) Exposed Structure Granulation Quality: Red Fat Layer (Subcutaneous Tissue) Exposed: Yes Necrotic Amount: Small (1-33%) Necrotic Quality: Adherent Slough Periwound Skin Texture Texture Color No Abnormalities Noted: No No Abnormalities Noted: No Callus: No Atrophie Blanche: No Burbage, Brynlyn S. ([id number]) Crepitus: No Cyanosis: No Excoriation: Yes Ecchymosis: No Induration: No Erythema: No Rash: No Hemosiderin Staining: No Scarring: No Mottled: No Pallor: No Moisture Rubor:  No No Abnormalities Noted: No Dry / Scaly: No Temperature / Pain Maceration: No Temperature: No Abnormality Tenderness on Palpation: Yes Wound Preparation Ulcer Cleansing: Rinsed/Irrigated with Saline Topical Anesthetic Applied: Other: lidocaine, Treatment Notes Wound #7 (Right Gluteus) 1. Cleansed with: Clean wound with Normal Saline 2. Anesthetic Topical Lidocaine 4% cream to wound bed prior to debridement 3. Peri-wound Care: Skin Prep 4. Dressing Applied: Prisma Ag 5. Secondary Dressing Applied Bordered Foam Dressing Notes drawtex Electronic Signature(s) Signed: 03/13/2018 4:07:12 PM By: Roger Shelter Entered By: Roger Shelter on 03/13/2018 14:03:22 Lindaman, Anajulia Chauncey Cruel ([id number]) -------------------------------------------------------------------------------- Wound Assessment Details Patient Name: Jamie Martyr. Date of Service: 03/13/2018 1:30 PM Medical Record Number: [id number] Patient Account Number: [id number] Date of Birth/Sex: 26-Apr-1966 (52 y.o. F) Treating [name]: Roger Shelter Primary Care Gabor Lusk: Delight Stare Other Clinician: Referring Leda Bellefeuille: Delight Stare Treating Oriana Horiuchi/Extender: STONE III, HOYT Weeks in Treatment: 12 Wound Status Wound Number: 8 Primary Diabetic Wound/Ulcer of the Lower Extremity Etiology: Wound Location: Left Foot - Lateral Wound Open Wounding Event: Gradually Appeared Status: Date Acquired: 11/27/2017 Comorbid Anemia, Arrhythmia, Coronary Artery Disease, Weeks Of Treatment: 12 History: Hypotension, Peripheral Arterial Disease, Type Clustered Wound: No II Diabetes, Osteoarthritis Pending Amputation On Presentation Photos Photo Uploaded By: [name], [name], [name], [name], [name] on 03/13/2018 16:07:28 Wound Measurements Length: (cm) 12.7 Width: (cm) 3.2 Depth: (cm) 0.1 Area: (cm) 31.919 Volume: (cm) 3.192 % Reduction in Area: 68.2% % Reduction in Volume: 68.2% Epithelialization: None Tunneling: No Wound  Description Classification: Grade 2 Foul Odor Wound Margin: Distinct, outline attached Slough/Fi Exudate Amount: Medium Exudate Type: Serous Exudate Color: amber After Cleansing: No brino Yes Wound Bed Granulation Amount: Medium (34-66%) Exposed Structure Granulation Quality: Red Fascia Exposed: No Necrotic Amount: Medium (34-66%) Fat Layer (Subcutaneous Tissue) Exposed: Yes Necrotic Quality: Eschar, Adherent Slough Tendon Exposed: No Muscle Exposed: No Joint Exposed: No Bone Exposed: No Periwound Skin Texture Steffler, Yurika S. ([id number]) Texture Color No Abnormalities Noted: No No Abnormalities Noted: No Callus: No Atrophie Blanche: No Crepitus: No Cyanosis: No Excoriation: No Ecchymosis: No Induration: No Erythema: No Rash: No Hemosiderin Staining: No Scarring: No Mottled: No Pallor: No Moisture Rubor: No No Abnormalities Noted: No Dry / Scaly: No Temperature / Pain Maceration: No Temperature: No Abnormality Tenderness on Palpation: Yes Wound Preparation Ulcer Cleansing: Not Cleansed Treatment Notes Wound #8 (Left, Lateral Foot) 1. Cleansed with: Clean wound with Normal Saline 2. Anesthetic Topical Lidocaine 4% cream to wound bed prior to debridement 4. Dressing Applied: Other dressing (specify in notes) 5. Secondary Dressing Applied ABD Pad Kerlix/Conform 7. Secured with Tape Notes betadine Scientist, physiological) Signed:  03/13/2018 4:07:12 PM By: Roger Shelter Entered By: Roger Shelter on 03/13/2018 14:03:43 Boniface, [name] ([id number]) -------------------------------------------------------------------------------- Vitals Details Patient Name: Jamie Martyr. Date of Service: 03/13/2018 1:30 PM Medical Record Number: [id number] Patient Account Number: [id number] Date of Birth/Sex: 22-May-1966 (52 y.o. F) Treating [name]: Roger Shelter Primary Care Quintel Mccalla: Delight Stare Other Clinician: Referring Chaniyah Jahr: Delight Stare Treating  Ronson Hagins/Extender: STONE III, HOYT Weeks in Treatment: 12 Vital Signs Time Taken: 13:43 Pulse (bpm): 85 Height (in): 67 Respiratory Rate (breaths/min): 18 Blood Pressure (mmHg): 76/53 Reference Range: 80 - 120 mg / dl Electronic Signature(s) Signed: 03/13/2018 4:07:12 PM By: Roger Shelter Entered By: Roger Shelter on 03/13/2018 13:43:52

## 2018-03-17 NOTE — Progress Notes (Signed)
[name], [name] ([id number]) Visit Report for 03/13/2018 Chief Complaint Document Details Patient Name: [name], [name]. Date of Service: 03/13/2018 1:30 PM Medical Record Number: [id number] Patient Account Number: [id number] Date of Birth/Sex: 1966/02/19 (52 y.o. F) Treating RN: Montey Hora Primary Care Provider: Delight Stare Other Clinician: Referring Provider: Delight Stare Treating Provider/Extender: Melburn Hake, HOYT Weeks in Treatment: 12 Information Obtained from: Patient Chief Complaint She is here in follow up evaluation for multiple wounds Electronic Signature([name]) Signed: 03/13/2018 5:37:52 PM By: Worthy Keeler PA-C Entered By: Worthy Keeler on 03/13/2018 13:49:12 [name], [name] ([id number]) -------------------------------------------------------------------------------- HPI Details Patient Name: [name]. Date of Service: 03/13/2018 1:30 PM Medical Record Number: [id number] Patient Account Number: [id number] Date of Birth/Sex: 1966/07/22 (52 y.o. F) Treating RN: Montey Hora Primary Care Provider: Delight Stare Other Clinician: Referring Provider: Delight Stare Treating Provider/Extender: Melburn Hake, HOYT Weeks in Treatment: 12 History of Present Illness HPI Description: 52 year old patient was sent to [place] from [facility] where she was seen by Dr. Sherril Cong for a left ankle ulceration and was recently hospitalized with hypotension and sepsis. She was treated with IV antibiotics and has been scheduled to see Dr. Sharol Given. She was seen by vascular surgery who recommended a femoral bypass but surgery has been delayed until her sacral wound from last year'[name] necrotizing fasciitis has healed. Was seeing the wound care team at [facility] but wanted to change over. She is a smoker and smokes a pack of cigarettes a day The patient was recently admitted in [place] between February 2 and February 14. She had a follow-up to see vascular surgery, orthopedic surgery and  infectious disease. 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 Review of the electronic medical records reveals that Dr. 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 An MRI showed edema in the bone which was consistent with osteomyelitis and the chronic ulcer and may need surgical intervention. She also had a sacral decubitus ulcer which was treated with the wound VAC. On 01/07/2017 she was taken up by the vascular surgeon Dr. Trula Slade for an abdominal aortogram and bilateral lower extremity runoff, for a history of having bilateral femoropopliteal bypass graft as well as external iliac stenting on the right and stenting of her bypass graft. She had developed a nonhealing wound on her right ankle and there was a possibility of a femoral occlusion. the findings were noted and the impression was a surgical revascularization with a aorto bifemoral bypass graft. Both the femoropopliteal bypass grafts were patent. 01/31/2017 -- x-ray of the right hip and pelvis -- IMPRESSION:No radiographic evidence of acute osteomyelitis. Normal-appearing right hip joint space for age. No acute bony abnormality of the hip. 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 Since seeing her last week she has not had an appointment with infectious disease or the orthopedic specialist yet. 02/06/2017 -- the patient missed a couple of appointments yesterday due to the weather but other than that has apparently given up smoking for the last 5  days. 02/13/2017 -- she has rescheduled her infectious disease  appointment and also the appointment with the orthopedic surgeon Dr. Sharol Given 03/06/2017 -- she was seen by Dr. Lucianne Lei dam of infectious disease on 03/05/2017 and he recommendedIV antibiotics but the patient did not want to have that and he has given her Augmentin and doxycycline and will reevaluate her in 2 months time. 03/13/2017 -- he saw Dr. Trula Slade regarding her vascular issues and he would like to wait to the sacral wound is completely closed before considering aortobifemoral bypass graft. He will see her back in 2 months time. She was also seen by Dr. Sharol Given of orthopedics who recommended continue wound care dressings and follow in the office in about 2 months time. he also recommended 3 view radiographs of the right ankle at follow-up. 03/28/2017 -- she did have a PICC line placed and Dr. Lucianne Lei dam has begun IV vancomycin and ceftriaxone. she is going to continue this until she sees him in approximately a month'[name] time 04/18/2017 -- we had applied for a skin substitute for the patient'[name] care but her copayment is going to be about $300 a piece and the patient will not be able to afford this. 05/08/17 on evaluation today patient appears to potentially have a fungal rash in the periwound region of the sacral area. This also seems to extend into the inguinal creases and factional region as well. She is tender to palpation with light rubbing over the region of the periwound and the skin in this region does have a beefy red appearance. Everything seems to point to a fungal infection at this time. She has been tolerating the dressing changes up to this point. There is no evidence of infection otherwise. 05/16/2017 -- was seen by Dr. Rhina Brackett dam of infectious disease -- he saw for the chronic wound over her right ankle [name], [name]. ([id number]) with osteomyelitis of the fibula. He did not think that she is making progress and was not able  to examine her sacral decubitus ulcer as the patient would not allow it. She was switched to Bactrim DS 2 tablets twice a day since finishing her antibiotics. Her ESR was 76 and a CRP was 2.8. 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 05/23/17 on evaluation today patient'[name] wound appears to be doing about the same in regard to the ankle as well as the gluteal area. She apparently has issues with the one back staying in place and she states the sill seems to break up the inferior aspect. She does have home help coming out to apply the wound VAC. She has been tolerating the dressing changes without any problem although she has had some increased pain in the gluteal region due to a fall she sustained and she feels like she brews that area and she had pain actually radiating down her leg which is slowly beginning to improve. There'[name] no evidence of infection. 05/29/17 Unfortunately on evaluation today patient'[name] wounds both in regard to the ankle as well as in regard to her gluteal region on the right appear to be measuring the same as last week'[name] evaluation. She also tells me that she is having soreness today in regard to the right gluteal area because she had a visitor who came to see her a couple days ago and stayed for six hours and she had to set up for that entire length of time visiting. This has called some increased discomfort. Normally she does not  set up for those long periods of time. Fortunately there'[name] no evidence of infection and no worsening of the wounds. 06/13/2017 -- the patient has had a lot of health issues including perfuse diarrhea and generalized weakness and I have instructed her to see her PCP as soon as possible and if things get worse to go to the ER. She has not brought her wound VAC today. She continues to be on oral antibiotics 06/30/17- she is here in follow up evaluation  for right ischial and right lateral malleolus ulcer. She continues with negative pressure wound therapy to the ischial wound. She is currently on a diabetic therapy per infectious disease, although she does state that she inconsistently takes it. She was advised to take her antibiotic as prescribed 07/11/2017 -- she has been unable to use a wound VAC regularly because she loses this evening and it has not been possible for her to use it. 07/18/2017 -- she is much more comfortable and looks better without the wound VAC and at this stage I believe it will be better for her to return the machine and continue with local care. 08/07/2017 -- she is back up to 3 weeks due to a vacation and during this time her wound VAC was returned. She has been doing local dressings 08/15/17 on evaluation today patient appears to be doing about the same in regard to her ankle as well as right gluteal wound. She has been tolerating the dressing changes and tells me that it'[name] mainly her gluteal wound that hurts the ankle is nontender to palpation. She rates this pain to be a one out of 10 at rest and with cleansing of the wound five out of 10 at least. No fevers, chills, nausea, or vomiting noted at this time. She does note that home health has told her to inquire about possibly using silver nitrate on the wound edges due to the road nature of the edges. 08/29/2017 -- -- I got a note which Dr. Lucianne Lei dam sent to Dr. 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 He had recommended definitive surgery to fix this. He recommended to continue with Bactrim Right ankle x-ray -- IMPRESSION: Findings compatible with cellulitis of the lateral malleolar region. A small focus of bony destruction involving the tip of the lateral malleolus is worrisome for osteomyelitis. 09/05/2017 -- the patient was seen by Dr. Meridee Score yesterday who thought her wound looked  good and he would continue local care with silver alginate dressing and follow-up in 4 weeks. He did not recommend any surgical intervention of the ankle until there is good arterial flow and he would await Dr. Stephens Shire opinion regarding this. She was also seen by infectious disease Dr. Lucianne Lei dam, who was concerned about her elevated potassium and took her off Bactrim. he has recommended doxycycline. 09/26/2017 -- she is back after 3 weeks as she was out of town on vacation. She has put on some weight but other than that no change in her health 10/24/2017 -- she was seen by Dr. [name] -- after a thorough review he plans a angiographically via the left femoral approach to evaluate her left subclavian artery and address any stenosis at that time. He will also perform bilateral runoff to confirm that a right femoropopliteal as well as a left femoropopliteal bypass graft remained patent. He is then considering a right axillary to femoral bypass graft for limb salvage. [name], [name] ([id number]) ==== 12/18/17-she  is here in follow-up evaluation for multiple wounds. She is [name]/p aortobifemoral and left fem-pop revision. She presents with multiple necrotic areas to the left toes and lateral foot. The wounds we were originally seen her for (right heel and left buttock) are improved/stable. According to her husband the wounds to the left foot are "better" than they were upon discharge from the hospital last Tuesday. He states that Dr. Trula Slade saw her on same day of discharge. She has a follow-up with Dr. Trula Slade on Monday. There are dopplerable pulses to the left dorsalis pedis, posterior tibial, peroneal. We communicated with Dr. Stephens Shire [name] regarding today'[name] findings, photos were sent to her email and the patient will maintain her appointment on Monday. We discussed with the patient and her husband to report to [facility] in [place] for any concerns, changes, new areas of necrosis, new  areas or progressive ischemia/purple discoloration, pallor, new/increased pain. We will anticipate follow up next week 12/30/17 patient unfortunately continues to have multiple wound of her lower extremities and specifically her left toes and lateral foot. She does have regular follow-up visits with Dr. Trula Slade who performed her surgery and at this point they are just holding to wait and see what needs to be done in regard to her foot such as what'[name] going to heal and what may need to have potential amputation of further surgery. For this reason we are just trying to maintain and see were things end up at this point. Fortunately patient is not having any significant discomfort. The right lateral malleolus appears to be doing about the same there is some granulation tissue she does have osteomyelitis and not ulcer. 01/06/18 patient presents today for fault evaluation concerning her ongoing issues with her right lateral ankle, right gluteal region, and left lower extremity. Currently regard to left lower extremity it'[name] possible that she may require some amputation although patient states she has the appointment follow-up with Dr. Trula Slade next week. That'[name] when she will find out what he thinks needs to be done at that point. Nonetheless in regard to the right lateral ankle this appears to be doing fairly well today. Her right gluteal region actually appears to be doing fairly well with a good wound surface any slough that was present easily cleaned off with saline and gauze. 01/16/18 on evaluation today patient appears to be doing much better actually in regard to her left foot necrotic areas. A lot of this is starting to liftoff as far as the eschar is concerned and there is a lot of healing underneath which is excellent news. I'm actually pleasantly pleased with the results that she'[name] getting in this regard. Fortunately there does not appear to be any evidence of infection which is great news. Overall she  has been tolerating the dressing changes without complication she does have one new ulcer on the right anterior shin due to a small skin tear which is actually healed but then the adhesive from a Band-Aid that was put on it at a doctor'[name] office actually has 20 skin and she says while he has a small ulcer due to that. Fortunately this is superficial. 01/30/18 on evaluation today patient appears to be doing rather well in regard to her left lower extremity ulcerations as well as her right lateral malleolus and gluteal region. She has been tolerating the dressing changes in general without complication. She did see Dr. Trula Slade her vascular surgeon since I last saw her in the good news is he does not really feel  like he'[name] gonna have to perform anything surgical in regard to deputation. With that being said I am still somewhat concerned about her left fifth toe we will have to see. Nonetheless overall I do believe she'[name] making progress and her right lateral malleolus appears to have at the allies to over at this point the question will be if this stays covered over or not. 02/13/18 on evaluation today patient continues to do rather well in regard to her ulcerations. Everything seems to be doing better her right lateral ankle is still open but just barely. Overall I have been happy with the progress. Patient also is happy she states even before I saw it and said anything she Artie knew it was better just from what she can feel. 02/27/18 on evaluation today patient presents for follow-up concerning her left lower extremity ulcer. She has been tolerating the dressing changes without complication she performs a lot of these on her own although she'[name] not able to really visualize the wound bed due to the fact that she is legally blind. She can see some things movement wise but not much. With that being said this really does not look to be as good as when I saw her two weeks ago on the 29th. We had been slowly  seeing the progress in regard to the eschar regions which were starting to lift up with the Betadine and doing great. Today I'm not seen as much of that and specifically around the great toe on the lateral aspect there appears to be some pus on the line and surrounding the eschar. She also has the same thing noted along with mottled skin in the region of the lateral fifth toe. Unfortunately these digits are also somewhat cyanotic at this point and cool to touch. Her leg is erythematous from around mid calf down to the foot as well. All of this is new since last evaluation two weeks ago. Patient really does not have any discomfort and really notes nothing has changed since her last visit in that regard. She continues to perform a lot of the dressing changes herself at this time. Overall I have been extremely pleased with how things have gone up into this point since her surgery. She does see Dr. Trula Slade on a regular basis on his last check with her things were going well. 03/13/18 on evaluation today patient appears to be doing well in regard to her ischial wound. With that being said unfortunately her left lower extremity appears to have progressed even since I last evaluated her. She did go to the hospital where she had a CT angiogram performed. It did show as part of the study significantly that she had a left femoral popliteal bypass graft [name], [name]. ([id number]) which is completely included. Arterial flow to left lower extremity is supplied to the deep femoral branches. On the right the revision of the right femoral popliteal bypass graph is still patent. Unfortunately visually her wounds specifically the necrotic regions of her left foot appear to be worse even compared to my last evaluation with her. Especially the great toe and the fifth toe. Electronic Signature([name]) Signed: 03/13/2018 5:37:52 PM By: Worthy Keeler PA-C Entered By: Worthy Keeler on 03/13/2018 15:37:31 [place], [name]  ([id number]) -------------------------------------------------------------------------------- Physical Exam Details Patient Name: MAKAYLA, LANTER [name]. Date of Service: 03/13/2018 1:30 PM Medical Record Number: [id number] Patient Account Number: [id number] Date of Birth/Sex: [birth year] (52 y.o. F) Treating RN: Montey Hora Primary Care Provider: Delight Stare Other  Clinician: Referring Provider: Tomaro, LINDA Treating Provider/Extender: STONE III, HOYT Weeks in Treatment: 12 Constitutional Chronically ill appearing but in no apparent acute distress. Respiratory normal breathing without difficulty. clear to auscultation bilaterally. Cardiovascular regular rate and rhythm with normal S1, S2. Absent posterior tibial and dorsalis pedis pulses left lower extremities. Psychiatric this patient is able to make decisions and demonstrates good insight into disease process. Alert and Oriented x 3. pleasant and cooperative. Notes At this point on evaluation the patient has a increasingly necrotic left first toe and fifth toe. She still has other necrotic areas but these are the two most significant at this point unfortunately. She had been doing very well but now this seems to have taken a turn for the worst unfortunately. Otherwise she'[name] been tolerating the dressing changes without complication. No debridement performed today. Electronic Signature([name]) Signed: 03/13/2018 5:37:52 PM By: Worthy Keeler PA-C Entered By: Worthy Keeler on 03/13/2018 15:41:00 [place], [name] ([id number]) -------------------------------------------------------------------------------- Physician Orders Details Patient Name: [name], [name]. Date of Service: 03/13/2018 1:30 PM Medical Record Number: [id number] Patient Account Number: [id number] Date of Birth/Sex: 02-24-1966 (52 y.o. 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 Wound #3 Right,Lateral Malleolus o Clean wound with Normal Saline. Wound #5 Left,Circumferential Toe Great o Clean wound with Normal Saline. Wound #6 Left,Circumferential Toe Fourth o Clean wound with Normal Saline. Wound #7 Right Gluteus o Clean wound with Normal Saline. Wound #8 Left,Lateral Foot o Clean wound with Normal Saline. Anesthetic (add to Medication List) Wound #7 Right Gluteus o Topical Lidocaine 4% cream applied to wound bed prior to debridement (In Clinic Only). Skin Barriers/Peri-Wound Care Wound #10 Left,Circumferential Toe Fifth o Skin Prep Wound #3 Right,Lateral Malleolus o Skin Prep Wound #5 Left,Circumferential Toe Great o Skin Prep Wound #6 Left,Circumferential Toe Fourth Soulier, Pennie [name]. ([id number]) o Skin Prep Wound #7 Right Gluteus o Skin Prep Wound #8 Left,Lateral Foot o Skin Prep Primary Wound Dressing Wound #10 Left,Circumferential Toe Fifth o Other: - betadine or providone-iodine (paint on wound) Wound #3 Right,Lateral Malleolus o Non-adherent pad Wound #5 Left,Circumferential Toe Great o Other: - betadine or providone-iodine (paint on wound) Wound #6 Left,Circumferential Toe Fourth o Other: - betadine or providone-iodine (paint on wound) Wound  #7 Right Gluteus o Silver Collagen o Drawtex Wound #8 Left,Lateral Foot o Other: - betadine or providone-iodine (paint on wound) Secondary Dressing Wound #10 Left,Circumferential Toe Fifth o Dry Gauze o Conform/Kerlix Wound #3 Right,Lateral Malleolus o Dry Gauze o Conform/Kerlix Wound #5 Left,Circumferential Toe Great o Dry Gauze o Conform/Kerlix Wound #6 Left,Circumferential Toe Fourth o Dry Gauze o Conform/Kerlix Wound #7 Right Gluteus o Boardered Foam Dressing Wound #8 Left,Lateral Foot o Dry Gauze o Conform/Kerlix Dressing Change Frequency Wound #10 Left,Circumferential Toe Fifth o Change dressing every other day. o Other: - PRN [place], Lavera [name]. ([id number]) Wound #3 Right,Lateral Malleolus o Change dressing  every other day. o Other: - PRN Wound #5 Left,Circumferential Toe Great o Change dressing every other day. o Other: - PRN Wound #6 Left,Circumferential Toe Fourth o Change dressing every other day. o Other: - PRN Wound #7 Right Gluteus o Change dressing every other day. o Other: - PRN Wound #8 Left,Lateral Foot o Change dressing every other day. o Other: - PRN Follow-up Appointments Wound #10 Left,Circumferential Toe Fifth o Return Appointment in 2 weeks. Wound #3 Right,Lateral Malleolus o Return Appointment in 2 weeks. Wound #5 Left,Circumferential Toe Great o Return Appointment in 2 weeks. Wound #6 Left,Circumferential Toe Fourth o Return Appointment in 2 weeks. Wound #7 Right Gluteus o Return Appointment in 2 weeks. Wound #8 Left,Lateral Foot o Return Appointment in 2 weeks. Off-Loading Wound #7 Right Gluteus o Turn and reposition every 2 hours Additional Orders / Instructions Wound #10 Left,Circumferential Toe Fifth o Increase protein intake. Wound #3 Right,Lateral Malleolus o Increase protein intake. Wound #5 Left,Circumferential Toe Great o Increase protein  intake. Wound #6 Left,Circumferential Toe Fourth o Increase protein intake. [name], [name]. ([id number]) Wound #7 Right Gluteus o Increase protein intake. Wound #8 Left,Lateral Foot o Increase protein intake. Home Health Wound #10 Left,Circumferential Toe [name] [name] may visit PRN to address patientos wound care needs. o FACE TO FACE ENCOUNTER: MEDICARE and MEDICAID PATIENTS: I certify that this patient is under my care and that I had a face-to-face encounter that meets the physician face-to-face encounter requirements with this patient on this date. The encounter with the patient was in whole or in part for the following MEDICAL CONDITION: (primary reason for [place]) MEDICAL NECESSITY: I certify, that based on my findings, NURSING services are a medically necessary home health service. HOME BOUND STATUS: I certify that my clinical findings support that this patient is homebound (i.e., Due to illness or injury, pt requires aid of supportive devices such as crutches, cane, wheelchairs, walkers, the use of special transportation or the assistance of another person to leave their place of residence. There is a normal inability to leave the home and doing so requires considerable and taxing effort. Other absences are for medical reasons / religious services and are infrequent or of short duration when for other reasons). o If current dressing causes regression in wound condition, may D/C ordered dressing product/[name] and apply Normal Saline Moist Dressing daily until next [place] / Other MD appointment. [place] of regression in wound condition at [phone]. o Please direct any NON-WOUND related issues/requests for orders to patient'[name] Primary Care Physician Wound #3 [place] [name] may visit PRN to address patientos wound care  needs. o FACE TO FACE ENCOUNTER: MEDICARE and MEDICAID PATIENTS: I certify that this patient is under my care and that I had a face-to-face encounter that meets the physician face-to-face encounter requirements with this patient on this date. The encounter with the patient was in whole or in part for the following MEDICAL CONDITION: (primary reason for [place]) MEDICAL NECESSITY: I certify, that based on my findings, NURSING services are a medically necessary home health service. HOME BOUND STATUS: I certify that my clinical findings support that this patient is homebound (i.e., Due to illness or injury, pt requires aid of supportive devices such as crutches, cane, wheelchairs, walkers, the use of special transportation or the assistance of another person to leave their place of residence. There is a normal  inability to leave the home and doing so requires considerable and taxing effort. Other absences are for medical reasons / religious services and are infrequent or of short duration when for other reasons). o If current dressing causes regression in wound condition, may D/C ordered dressing product/[name] and apply Normal Saline Moist Dressing daily until next [place] / Other MD appointment. [place] of regression in wound condition at [phone]. o Please direct any NON-WOUND related issues/requests for orders to patient'[name] Primary Care Physician Wound #5 Left,Circumferential Toe [place] [name] may visit PRN to address patientos wound care needs. o FACE TO FACE ENCOUNTER: MEDICARE and MEDICAID PATIENTS: I certify that this patient is under my care and that I had a face-to-face encounter that meets the physician face-to-face encounter requirements with this patient on this date. The encounter with the patient was in whole or in part for the following MEDICAL CONDITION: (primary reason for [place])  MEDICAL NECESSITY: I certify, that based on my findings, NURSING services are a medically necessary home health service. HOME BOUND STATUS: I certify that my clinical findings support that this patient is homebound (i.e., Due to illness or injury, pt requires aid of supportive devices such as crutches, cane, wheelchairs, walkers, the use of special transportation or the assistance of another person to leave their place of residence. There is a normal inability to leave the home and doing so requires considerable and taxing effort. Other absences are for medical reasons / religious services and are infrequent or of short duration when for other reasons). ANNETTE, BERTELSON ([id number]) o If current dressing causes regression in wound condition, may D/C ordered dressing product/[name] and apply Normal Saline Moist Dressing daily until next [place] / Other MD appointment. [place] of regression in wound condition at [phone]. o Please direct any NON-WOUND related issues/requests for orders to patient'[name] Primary Care Physician Wound #6 Left,Circumferential Toe [place] [name] may visit PRN to address patientos wound care needs. o FACE TO FACE ENCOUNTER: MEDICARE and MEDICAID PATIENTS: I certify that this patient is under my care and that I had a face-to-face encounter that meets the physician face-to-face encounter requirements with this patient on this date. The encounter with the patient was in whole or in part for the following MEDICAL CONDITION: (primary reason for [place]) MEDICAL NECESSITY: I certify, that based on my findings, NURSING services are a medically necessary home health service. HOME BOUND STATUS: I certify that my clinical findings support that this patient is homebound (i.e., Due to illness or injury, pt requires aid of supportive devices such as crutches, cane, wheelchairs, walkers, the use of  special transportation or the assistance of another person to leave their place of residence. There is a normal inability to leave the home and doing so requires considerable and taxing effort. Other absences are for medical reasons / religious services and are infrequent or of short duration when for other reasons). o If current dressing causes regression in wound condition, may D/C ordered dressing product/[name] and apply Normal Saline Moist Dressing daily until next [place] / Other MD appointment. [place] of regression in wound condition at [phone]. o Please direct any NON-WOUND related issues/requests for orders to patient'[name] Primary Care Physician Wound #7 Right [place] [name] may visit PRN to address patientos wound care needs.   o FACE TO FACE ENCOUNTER: MEDICARE and MEDICAID PATIENTS: I certify that this patient is under my care and that I had a face-to-face encounter that meets the physician face-to-face encounter requirements with this patient on this date. The encounter with the patient was in whole or in part for the following MEDICAL CONDITION: (primary reason for [place]) MEDICAL NECESSITY: I certify, that based on my findings, NURSING services are a medically necessary home health service. HOME BOUND STATUS: I certify that my clinical findings support that this patient is homebound (i.e., Due to illness or injury, pt requires aid of supportive devices such as crutches, cane, wheelchairs, walkers, the use of special transportation or the assistance of another person to leave their place of residence. There is a normal inability to leave the home and doing so requires considerable and taxing effort. Other absences are for medical reasons / religious services and are infrequent or of short duration when for other reasons). o If current dressing causes regression in wound condition, may D/C  ordered dressing product/[name] and apply Normal Saline Moist Dressing daily until next [place] / Other MD appointment. [place] of regression in wound condition at [phone]. o Please direct any NON-WOUND related issues/requests for orders to patient'[name] Primary Care Physician Wound #8 [place] Visits o Home Health [name] may visit PRN to address patientos wound care needs. o FACE TO FACE ENCOUNTER: MEDICARE and MEDICAID PATIENTS: I certify that this patient is under my care and that I had a face-to-face encounter that meets the physician face-to-face encounter requirements with this patient on this date. The encounter with the patient was in whole or in part for the following MEDICAL CONDITION: (primary reason for [place]) MEDICAL NECESSITY: I certify, that based on my findings, NURSING services are a medically necessary home health service. HOME BOUND STATUS: I certify that my clinical findings support that this patient is homebound (i.e., Due to illness or injury, pt requires aid of supportive devices such as crutches, cane, wheelchairs, walkers, the use of special transportation or the assistance of another person to leave their place of residence. There is a normal inability to leave the home and doing so requires considerable and taxing effort. Other absences are for medical reasons / religious services and are infrequent or of short duration when for other reasons). o If current dressing causes regression in wound condition, may D/C ordered dressing product/[name] and apply Normal Saline Moist Dressing daily until next [place] / Other MD appointment. [place] of regression in wound condition at [phone]. o Please direct any NON-WOUND related issues/requests for orders to patient'[name] Primary Care Physician [name], [name] ([id number]) Electronic Signature([name]) Signed: 03/13/2018  4:37:07 PM By: Montey Hora Signed: 03/13/2018 5:37:52 PM By: Worthy Keeler PA-C Entered By: Montey Hora on 03/13/2018 14:33:40 [name], [name] ([id number]) -------------------------------------------------------------------------------- Problem List Details Patient Name: [name], [name]. Date of Service: 03/13/2018 1:30 PM Medical Record Number: [id number] Patient Account Number: [id number] Date of Birth/Sex: [birth year] (52 y.o. 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 ([id number]) -------------------------------------------------------------------------------- Progress Note Details Patient Name: [name]. Date of Service: 03/13/2018 1:30 PM Medical Record Number: [id number] Patient Account Number: [id number] Date of Birth/Sex: Oct 02, 1966 (52 y.o. F) Treating RN: Montey Hora Primary Care Provider: Delight Stare Other Clinician: Referring Provider: Delight Stare Treating Provider/Extender: Melburn Hake, HOYT Weeks  in Treatment: 12 Subjective Chief Complaint Information obtained from Patient She is here in follow up evaluation for multiple wounds History of Present Illness (HPI) 52 year old patient was sent to [place] from [facility] where she was seen by Dr. Sherril Cong for a left ankle ulceration and was recently hospitalized with hypotension and sepsis. She was treated with IV antibiotics and has been scheduled to see Dr. Sharol Given. She was seen by vascular surgery who recommended a femoral bypass but surgery has been delayed until her sacral wound from last year'[name] necrotizing fasciitis has healed. Was seeing the wound care team at [facility] but wanted to change over. She is a smoker and smokes a pack of cigarettes a day The patient was recently admitted in [place] between February 2 and February 14. She had a follow-up to see vascular surgery, orthopedic surgery and infectious disease. 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 Review of the electronic medical records reveals that Dr. 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 An MRI showed edema in the bone which was consistent with osteomyelitis and the chronic ulcer and may need surgical intervention. She also had a sacral decubitus ulcer which was treated with the wound VAC. On 01/07/2017 she was taken up by the vascular surgeon Dr. Trula Slade for an abdominal aortogram and bilateral lower extremity runoff, for a history of having bilateral femoropopliteal bypass graft as well as external iliac stenting on the right  and stenting of her bypass graft. She had developed a nonhealing wound on her right ankle and there was a possibility of a femoral occlusion. the findings were noted and the impression was a surgical revascularization with a aorto bifemoral bypass graft. Both the femoropopliteal bypass grafts were patent. 01/31/2017 -- x-ray of the right hip and pelvis -- IMPRESSION:No radiographic evidence of acute osteomyelitis. Normal-appearing right hip joint space for age. No acute bony abnormality of the hip. 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 Since seeing her last week she has not had an appointment with infectious disease or the orthopedic specialist yet. 02/06/2017 -- the patient missed a couple of appointments yesterday due to the weather but other than that has apparently given up smoking for the last 5 days. 02/13/2017 -- she has rescheduled her infectious disease appointment and also the appointment with the orthopedic surgeon Dr. Sharol Given 03/06/2017 -- she was seen by Dr.  Lucianne Lei dam of infectious disease on 03/05/2017 and he recommendedIV antibiotics but the patient did not want to have that and he has given her Augmentin and doxycycline and will reevaluate her in 2 months time. 03/13/2017 -- he saw Dr. Trula Slade regarding her vascular issues and he would like to wait to the sacral wound is completely closed before considering aortobifemoral bypass graft. He will see her back in 2 months time. She was also seen by Dr. Sharol Given of orthopedics who recommended continue wound care dressings and follow in the office in about 2 months time. he also recommended 3 view radiographs of the right ankle at follow-up. 03/28/2017 -- she did have a PICC line placed and Dr. Lucianne Lei dam has begun IV vancomycin and ceftriaxone. she is going to continue this until she sees him in approximately a month'[name] time 04/18/2017 -- we had applied for a skin substitute for the  patient'[name] care but her copayment is going to be about $300 a piece and the patient will not be able to afford this. 05/08/17 on evaluation today patient appears to potentially have a fungal rash in the periwound region of the sacral area. This [name], [name]. ([id number]) also seems to extend into the inguinal creases and factional region as well. She is tender to palpation with light rubbing over the region of the periwound and the skin in this region does have a beefy red appearance. Everything seems to point to a fungal infection at this time. She has been tolerating the dressing changes up to this point. There is no evidence of infection otherwise. 05/16/2017 -- was seen by Dr. 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 He did not think that she is making progress and was not able to examine her sacral decubitus ulcer as the patient would not allow it. She was switched to Bactrim DS 2 tablets twice a day since finishing her antibiotics. Her ESR was 76 and a CRP was 2.8. 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 05/23/17 on evaluation today patient'[name] wound appears to be doing about the same in regard to the ankle as well as the gluteal area. She apparently has issues with the one back staying in place and she states the sill seems to break up the inferior aspect. She does have home help coming out to apply the wound VAC. She has been tolerating the dressing changes without any problem although she has had some increased pain in the gluteal region due to a fall she sustained and she feels like she brews that area and she had pain actually radiating down her leg which is slowly beginning to improve. There'[name] no evidence of infection. 05/29/17 Unfortunately on evaluation today patient'[name] wounds both in regard to the  ankle as well as in regard to her gluteal region on the right appear to be measuring the same as last week'[name] evaluation. She also tells me that she is having soreness today in regard to the right gluteal area because she had a visitor who came to see her a couple days ago and stayed for six hours and she had to set up for that entire length of time visiting. This has called some increased discomfort. Normally she does not set up for those long periods of time. Fortunately there'[name] no evidence of infection and no worsening  of the wounds. 06/13/2017 -- the patient has had a lot of health issues including perfuse diarrhea and generalized weakness and I have instructed her to see her PCP as soon as possible and if things get worse to go to the ER. She has not brought her wound VAC today. She continues to be on oral antibiotics 06/30/17- she is here in follow up evaluation for right ischial and right lateral malleolus ulcer. She continues with negative pressure wound therapy to the ischial wound. She is currently on a diabetic therapy per infectious disease, although she does state that she inconsistently takes it. She was advised to take her antibiotic as prescribed 07/11/2017 -- she has been unable to use a wound VAC regularly because she loses this evening and it has not been possible for her to use it. 07/18/2017 -- she is much more comfortable and looks better without the wound VAC and at this stage I believe it will be better for her to return the machine and continue with local care. 08/07/2017 -- she is back up to 3 weeks due to a vacation and during this time her wound VAC was returned. She has been doing local dressings 08/15/17 on evaluation today patient appears to be doing about the same in regard to her ankle as well as right gluteal wound. She has been tolerating the dressing changes and tells me that it'[name] mainly her gluteal wound that hurts the ankle is nontender to palpation. She rates  this pain to be a one out of 10 at rest and with cleansing of the wound five out of 10 at least. No fevers, chills, nausea, or vomiting noted at this time. She does note that home health has told her to inquire about possibly using silver nitrate on the wound edges due to the road nature of the edges. 08/29/2017 -- -- I got a note which Dr. Lucianne Lei dam sent to Dr. 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 He had recommended definitive surgery to fix this. He recommended to continue with Bactrim Right ankle x-ray -- IMPRESSION: Findings compatible with cellulitis of the lateral malleolar region. A small focus of bony destruction involving the tip of the lateral malleolus is worrisome for osteomyelitis. 09/05/2017 -- the patient was seen by Dr. Meridee Score yesterday who thought her wound looked good and he would continue local care with silver alginate dressing and follow-up in 4 weeks. He did not recommend any surgical intervention of the ankle until there is good arterial flow and he would await Dr. Stephens Shire opinion regarding this. She was also seen by infectious disease Dr. Lucianne Lei dam, who was concerned about her elevated potassium and took her off Bactrim. he has recommended doxycycline. 09/26/2017 -- she is back after 3 weeks as she was out of town on vacation. She has put on some weight but other than that TANAISHA, PITTMAN. ([id number]) no change in her health 10/24/2017 -- she was seen by Dr. [name] -- after a thorough review he plans a angiographically via the left femoral approach to evaluate her left subclavian artery and address any stenosis at that time. He will also perform bilateral runoff to confirm that a right femoropopliteal as well as a left femoropopliteal bypass graft remained patent. He is then considering a right axillary to femoral bypass graft for limb salvage. ==== 12/18/17-she is here in  follow-up evaluation for multiple wounds. She is [name]/p aortobifemoral and left fem-pop revision. She  presents with multiple necrotic areas to the left toes and lateral foot. The wounds we were originally seen her for (right heel and left buttock) are improved/stable. According to her husband the wounds to the left foot are "better" than they were upon discharge from the hospital last Tuesday. He states that Dr. Trula Slade saw her on same day of discharge. She has a follow-up with Dr. Trula Slade on Monday. There are dopplerable pulses to the left dorsalis pedis, posterior tibial, peroneal. We communicated with Dr. Stephens Shire [name] regarding today'[name] findings, photos were sent to her email and the patient will maintain her appointment on Monday. We discussed with the patient and her husband to report to [facility] in [place] for any concerns, changes, new areas of necrosis, new areas or progressive ischemia/purple discoloration, pallor, new/increased pain. We will anticipate follow up next week 12/30/17 patient unfortunately continues to have multiple wound of her lower extremities and specifically her left toes and lateral foot. She does have regular follow-up visits with Dr. Trula Slade who performed her surgery and at this point they are just holding to wait and see what needs to be done in regard to her foot such as what'[name] going to heal and what may need to have potential amputation of further surgery. For this reason we are just trying to maintain and see were things end up at this point. Fortunately patient is not having any significant discomfort. The right lateral malleolus appears to be doing about the same there is some granulation tissue she does have osteomyelitis and not ulcer. 01/06/18 patient presents today for fault evaluation concerning her ongoing issues with her right lateral ankle, right gluteal region, and left lower extremity. Currently regard to left lower extremity it'[name] possible that she  may require some amputation although patient states she has the appointment follow-up with Dr. Trula Slade next week. That'[name] when she will find out what he thinks needs to be done at that point. Nonetheless in regard to the right lateral ankle this appears to be doing fairly well today. Her right gluteal region actually appears to be doing fairly well with a good wound surface any slough that was present easily cleaned off with saline and gauze. 01/16/18 on evaluation today patient appears to be doing much better actually in regard to her left foot necrotic areas. A lot of this is starting to liftoff as far as the eschar is concerned and there is a lot of healing underneath which is excellent news. I'm actually pleasantly pleased with the results that she'[name] getting in this regard. Fortunately there does not appear to be any evidence of infection which is great news. Overall she has been tolerating the dressing changes without complication she does have one new ulcer on the right anterior shin due to a small skin tear which is actually healed but then the adhesive from a Band-Aid that was put on it at a doctor'[name] office actually has 20 skin and she says while he has a small ulcer due to that. Fortunately this is superficial. 01/30/18 on evaluation today patient appears to be doing rather well in regard to her left lower extremity ulcerations as well as her right lateral malleolus and gluteal region. She has been tolerating the dressing changes in general without complication. She did see Dr. Trula Slade her vascular surgeon since I last saw her in the good news is he does not really feel like he'[name] gonna have to perform anything surgical in regard to deputation. With that being said I am  still somewhat concerned about her left fifth toe we will have to see. Nonetheless overall I do believe she'[name] making progress and her right lateral malleolus appears to have at the allies to over at this point the question will  be if this stays covered over or not. 02/13/18 on evaluation today patient continues to do rather well in regard to her ulcerations. Everything seems to be doing better her right lateral ankle is still open but just barely. Overall I have been happy with the progress. Patient also is happy she states even before I saw it and said anything she Artie knew it was better just from what she can feel. 02/27/18 on evaluation today patient presents for follow-up concerning her left lower extremity ulcer. She has been tolerating the dressing changes without complication she performs a lot of these on her own although she'[name] not able to really visualize the wound bed due to the fact that she is legally blind. She can see some things movement wise but not much. With that being said this really does not look to be as good as when I saw her two weeks ago on the 29th. We had been slowly seeing the progress in regard to the eschar regions which were starting to lift up with the Betadine and doing great. Today I'm not seen as much of that and specifically around the great toe on the lateral aspect there appears to be some pus on the line and surrounding the eschar. She also has the same thing noted along with mottled skin in the region of the lateral fifth toe. Unfortunately these digits are also somewhat cyanotic at this point and cool to touch. Her leg is erythematous from around mid calf down to the foot as well. All of this is new since last evaluation two weeks ago. Patient really does not have any discomfort and really notes nothing has changed since her last visit in that regard. She continues to perform a lot of the [name], [name]. ([id number]) dressing changes herself at this time. Overall I have been extremely pleased with how things have gone up into this point since her surgery. She does see Dr. Trula Slade on a regular basis on his last check with her things were going well. 03/13/18 on evaluation today  patient appears to be doing well in regard to her ischial wound. With that being said unfortunately her left lower extremity appears to have progressed even since I last evaluated her. She did go to the hospital where she had a CT angiogram performed. It did show as part of the study significantly that she had a left femoral popliteal bypass graft which is completely included. Arterial flow to left lower extremity is supplied to the deep femoral branches. On the right the revision of the right femoral popliteal bypass graph is still patent. Unfortunately visually her wounds specifically the necrotic regions of her left foot appear to be worse even compared to my last evaluation with her. Especially the great toe and the fifth toe. Patient History Information obtained from Patient. Family History Diabetes - Mother,Father,Maternal Grandparents,Paternal Grandparents,Child, Heart Disease - [place] Grandparents, Hereditary Spherocytosis - Mother, Hypertension - Mother,Siblings,Paternal Grandparents,Maternal Grandparents, Seizures - Maternal Grandparents,Paternal Grandparents, Thyroid Problems - Siblings, No family history of Cancer, Kidney Disease, Lung Disease, Stroke, Tuberculosis. Social History Current every day smoker, Marital Status - Married, Alcohol Use - Rarely, Drug Use - No History, Caffeine Use - Moderate. Review of Systems (ROS) Constitutional Symptoms (General Health) Denies complaints  or symptoms of Fever, Chills. Respiratory The patient has no complaints or symptoms. Cardiovascular The patient has no complaints or symptoms. Psychiatric The patient has no complaints or symptoms. Objective Constitutional Chronically ill appearing but in no apparent acute distress. Vitals Time Taken: 1:43 PM, Height: 67 in, Pulse: 85 bpm, Respiratory Rate: 18 breaths/min, Blood Pressure: 76/53 mmHg. Respiratory normal breathing without difficulty. clear to  auscultation bilaterally. Cardiovascular regular rate and rhythm with normal S1, S2. Absent posterior tibial and dorsalis pedis pulses left lower extremities. Psychiatric this patient is able to make decisions and demonstrates good insight into disease process. Alert and Oriented x 3. pleasant [name], [name]. ([id number]) and cooperative. General Notes: At this point on evaluation the patient has a increasingly necrotic left first toe and fifth toe. She still has other necrotic areas but these are the two most significant at this point unfortunately. She had been doing very well but now this seems to have taken a turn for the worst unfortunately. Otherwise she'[name] been tolerating the dressing changes without complication. No debridement performed today. Integumentary (Hair, Skin) Wound #10 status is Open. Original cause of wound was Gradually Appeared. The wound is located on the Left,Circumferential Toe Fifth. The wound measures 3.2cm length x 4.2cm width x 0.1cm depth; 10.556cm^2 area and 1.056cm^3 volume. There is no tunneling or undermining noted. There is a none present amount of drainage noted. The wound margin is distinct with the outline attached to the wound base. There is no granulation within the wound bed. There is a large (67-100%) amount of necrotic tissue within the wound bed including Eschar. The periwound skin appearance exhibited: Mottled. The periwound skin appearance did not exhibit: Callus, Crepitus, Excoriation, Induration, Rash, Scarring, Dry/Scaly, Maceration, Atrophie Blanche, Cyanosis, Ecchymosis, Hemosiderin Staining, Pallor, Rubor, Erythema. Periwound temperature was noted as No Abnormality. Wound #3 status is Open. Original cause of wound was Pressure Injury. The wound is located on the Right,Lateral Malleolus. The wound measures 0.1cm length x 0.1cm width x 0.1cm depth; 0.008cm^2 area and 0.001cm^3 volume. There is Fat Layer (Subcutaneous Tissue) Exposed exposed.  There is no tunneling or undermining noted. There is a none present amount of drainage noted. The wound margin is distinct with the outline attached to the wound base. There is medium (34-66%) pink granulation within the wound bed. There is a medium (34-66%) amount of necrotic tissue within the wound bed including Eschar and Adherent Slough. The periwound skin appearance did not exhibit: Callus, Crepitus, Excoriation, Induration, Rash, Scarring, Dry/Scaly, Maceration, Atrophie Blanche, Cyanosis, Ecchymosis, Hemosiderin Staining, Mottled, Pallor, Rubor, Erythema. Periwound temperature was noted as No Abnormality. Wound #5 status is Open. Original cause of wound was Gradually Appeared. The wound is located on the Left,Circumferential Toe Great. The wound measures 5.8cm length x 6.5cm width x 0.1cm depth; 29.61cm^2 area and 2.961cm^3 volume. There is no tunneling or undermining noted. There is a none present amount of drainage noted. The wound margin is distinct with the outline attached to the wound base. There is no granulation within the wound bed. There is a large (67-100%) amount of necrotic tissue within the wound bed including Eschar. The periwound skin appearance did not exhibit: Callus, Crepitus, Excoriation, Induration, Rash, Scarring, Dry/Scaly, Maceration, Atrophie Blanche, Cyanosis, Ecchymosis, Hemosiderin Staining, Mottled, Pallor, Rubor, Erythema. Periwound temperature was noted as No Abnormality. Wound #6 status is Open. Original cause of wound was Gradually Appeared. The wound is located on the Left,Circumferential Toe Fourth. The wound measures 1.8cm length x 3.4cm width x 0.1cm depth; 4.807cm^2  area and 0.481cm^3 volume. There is no tunneling or undermining noted. There is a none present amount of drainage noted. The wound margin is distinct with the outline attached to the wound base. There is no granulation within the wound bed. There is a large (67-100%) amount of necrotic tissue  within the wound bed including Eschar. The periwound skin appearance did not exhibit: Callus, Crepitus, Excoriation, Induration, Rash, Scarring, Dry/Scaly, Maceration, Atrophie Blanche, Cyanosis, Ecchymosis, Hemosiderin Staining, Mottled, Pallor, Rubor, Erythema. Periwound temperature was noted as No Abnormality. Wound #7 status is Open. Original cause of wound was Gradually Appeared. The wound is located on the Right Gluteus. The wound measures 1.6cm length x 0.6cm width x 0.5cm depth; 0.754cm^2 area and 0.377cm^3 volume. There is Fat Layer (Subcutaneous Tissue) Exposed exposed. There is no tunneling or undermining noted. There is a large amount of serosanguineous drainage noted. The wound margin is distinct with the outline attached to the wound base. There is large (67-100%) red granulation within the wound bed. There is a small (1-33%) amount of necrotic tissue within the wound bed including Adherent Slough. The periwound skin appearance exhibited: Excoriation. The periwound skin appearance did not exhibit: Callus, Crepitus, Induration, Rash, Scarring, Dry/Scaly, Maceration, Atrophie Blanche, Cyanosis, Ecchymosis, Hemosiderin Staining, Mottled, Pallor, Rubor, Erythema. Periwound temperature was noted as No Abnormality. The periwound has tenderness on palpation. Wound #8 status is Open. Original cause of wound was Gradually Appeared. The wound is located on the Left,Lateral Foot. The wound measures 12.7cm length x 3.2cm width x 0.1cm depth; 31.919cm^2 area and 3.192cm^3 volume. There is Fat Layer (Subcutaneous Tissue) Exposed exposed. There is no tunneling noted. There is a medium amount of serous drainage noted. The wound margin is distinct with the outline attached to the wound base. There is medium (34-66%) red granulation within the wound bed. There is a medium (34-66%) amount of necrotic tissue within the wound bed including Eschar and Adherent Slough. The periwound skin appearance did not  exhibit: Callus, Crepitus, Excoriation, Induration, Rash, Scarring, Dry/Scaly, Maceration, Atrophie Blanche, Cyanosis, Ecchymosis, Hemosiderin Staining, Mottled, Pallor, Rubor, Erythema. Schooley, Deztinee [name]. ([id number]) Periwound temperature was noted as No Abnormality. The periwound has tenderness on palpation. 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 Wound #3 Right,Lateral Malleolus: Clean wound with Normal Saline. Wound #5 Left,Circumferential Toe Great: Clean wound with Normal Saline. Wound #6 Left,Circumferential Toe Fourth: Clean wound with Normal Saline. Wound #7 Right Gluteus: Clean wound with Normal Saline. Wound #8 Left,Lateral Foot: Clean wound with Normal Saline. Anesthetic (add to Medication List): Wound #7 Right Gluteus: Topical Lidocaine 4% cream applied to wound bed prior to debridement (In Clinic Only). Skin Barriers/Peri-Wound Care: Wound #10 Left,Circumferential Toe Fifth: Skin Prep Wound #3 Right,Lateral Malleolus: Skin Prep Wound #5 Left,Circumferential Toe Great: Skin Prep Wound #6 Left,Circumferential Toe Fourth: Skin Prep Wound #7 Right Gluteus: Skin Prep Wound #8 Left,Lateral Foot: Skin Prep Primary Wound Dressing: Wound #10 Left,Circumferential Toe Fifth: Other: - betadine or providone-iodine (paint on wound) Wound #3 Right,Lateral Malleolus: Non-adherent pad Thinnes, Lucile [name].  (  [id number]) Wound #5 Left,Circumferential Toe Great: Other: - betadine or providone-iodine (paint on wound) Wound #6 Left,Circumferential Toe Fourth: Other: - betadine or providone-iodine (paint on wound) Wound #7 Right Gluteus: Silver Collagen Drawtex Wound #8 Left,Lateral Foot: Other: - betadine or providone-iodine (paint on wound) Secondary Dressing: Wound #10 Left,Circumferential Toe Fifth: Dry Gauze Conform/Kerlix Wound #3 Right,Lateral Malleolus: Dry Gauze Conform/Kerlix Wound #5 Left,Circumferential Toe Great: Dry Gauze Conform/Kerlix Wound #6 Left,Circumferential Toe Fourth: Dry Gauze Conform/Kerlix Wound #7 Right Gluteus: Boardered Foam Dressing Wound #8 Left,Lateral Foot: Dry Gauze Conform/Kerlix Dressing Change Frequency: Wound #10 Left,Circumferential Toe Fifth: Change dressing every other day. Other: - PRN Wound #3 Right,Lateral Malleolus: Change dressing every other day. Other: - PRN Wound #5 Left,Circumferential Toe Great: Change dressing every other day. Other: - PRN Wound #6 Left,Circumferential Toe Fourth: Change dressing every other day. Other: - PRN Wound #7 Right Gluteus: Change dressing every other day. Other: - PRN Wound #8 Left,Lateral Foot: Change dressing every other day. Other: - PRN Follow-up Appointments: Wound #10 Left,Circumferential Toe Fifth: Return Appointment in 2 weeks. Wound #3 Right,Lateral Malleolus: Return Appointment in 2 weeks. Wound #5 Left,Circumferential Toe Great: Return Appointment in 2 weeks. Wound #6 Left,Circumferential Toe Fourth: Return Appointment in 2 weeks. Wound #7 Right Gluteus: Return Appointment in 2 weeks. Wound #8 Left,Lateral Foot: Return Appointment in 2 weeks. Off-Loading: [place]uck[place], Omnia [name]. ([id number]) Wound #7 Right Gluteus: Turn and reposition every 2 hours Additional Orders / Instructions: Wound #10 Left,Circumferential Toe Fifth: Increase protein intake. Wound #3 Right,Lateral  Malleolus: Increase protein intake. Wound #5 Left,Circumferential Toe Great: Increase protein intake. Wound #6 Left,Circumferential Toe Fourth: Increase protein intake. Wound #7 Right Gluteus: Increase protein intake. Wound #8 Left,Lateral Foot: Increase protein intake. Home Health: Wound #10 Left,Circumferential Toe Fifth: [place] [name] may visit PRN to address patient [name] wound care needs. FACE TO FACE ENCOUNTER: MEDICARE and MEDICAID PATIENTS: I certify that this patient is under my care and that I had a face-to-face encounter that meets the physician face-to-face encounter requirements with this patient on this date. The encounter with the patient was in whole or in part for the following MEDICAL CONDITION: (primary reason for [place]) MEDICAL NECESSITY: I certify, that based on my findings, NURSING services are a medically necessary home health service. HOME BOUND STATUS: I certify that my clinical findings support that this patient is homebound (i.e., Due to illness or injury, pt requires aid of supportive devices such as crutches, cane, wheelchairs, walkers, the use of special transportation or the assistance of another person to leave their place of residence. There is a normal inability to leave the home and doing so requires considerable and taxing effort. Other absences are for medical reasons / religious services and are infrequent or of short duration when for other reasons). If current dressing causes regression in wound condition, may D/C ordered dressing product/[name] and apply Normal Saline Moist Dressing daily until next [place] / Other MD appointment. [place] of regression in wound condition at [phone]. Please direct any NON-WOUND related issues/requests for orders to patient'[name] Primary Care Physician Wound #3 Right,Lateral Malleolus: [place] [name] may visit PRN to  address patient [name] wound care needs. FACE TO FACE ENCOUNTER: MEDICARE and MEDICAID PATIENTS: I certify that this patient is under my care and that I had a face-to-face encounter that meets the physician face-to-face encounter requirements with this patient on this date. The encounter with the patient was  in whole or in part for the following MEDICAL CONDITION: (primary reason for Home Healthcare) MEDICAL NECESSITY: I certify, that based on my findings, NURSING services are a medically necessary home health service. HOME BOUND STATUS: I certify that my clinical findings support that this patient is homebound (i.e., Due to illness or injury, pt requires aid of supportive devices such as crutches, cane, wheelchairs, walkers, the use of special transportation or the assistance of another person to leave their place of residence. There is a normal inability to leave the home and doing so requires considerable and taxing effort. Other absences are for medical reasons / religious services and are infrequent or of short duration when for other reasons). If current dressing causes regression in wound condition, may D/C ordered dressing product/[name] and apply Normal Saline Moist Dressing daily until next [place] / Other MD appointment. [place] of regression in wound condition at [phone]. Please direct any NON-WOUND related issues/requests for orders to patient'[name] Primary Care Physician Wound #5 Left,Circumferential Toe Great: [place] [name] may visit PRN to address patient [name] wound care needs. FACE TO FACE ENCOUNTER: MEDICARE and MEDICAID PATIENTS: I certify that this patient is under my care and that I had a face-to-face encounter that meets the physician face-to-face encounter requirements with this patient on this date. The encounter with the patient was in whole or in part for the following MEDICAL CONDITION: (primary reason for  [place]) MEDICAL NECESSITY: I certify, that based on my findings, NURSING services are a medically necessary home health service. HOME BOUND STATUS: I certify that my clinical findings support that this patient is homebound (i.e., Due to illness or injury, pt requires aid of supportive devices such as crutches, cane, wheelchairs, walkers, the use of special transportation or the assistance of another person to leave their place of residence. There is a normal inability to leave the home and doing so requires considerable and taxing effort. Other absences are for medical reasons / religious services and are infrequent or of short duration when for other reasons). CELES, DEDIC ([id number]) If current dressing causes regression in wound condition, may D/C ordered dressing product/[name] and apply Normal Saline Moist Dressing daily until next [place] / Other MD appointment. [place] of regression in wound condition at [phone]. Please direct any NON-WOUND related issues/requests for orders to patient'[name] Primary Care Physician Wound #6 Left,Circumferential Toe Fourth: [place] [name] may visit PRN to address patient [name] wound care needs. FACE TO FACE ENCOUNTER: MEDICARE and MEDICAID PATIENTS: I certify that this patient is under my care and that I had a face-to-face encounter that meets the physician face-to-face encounter requirements with this patient on this date. The encounter with the patient was in whole or in part for the following MEDICAL CONDITION: (primary reason for [place]) MEDICAL NECESSITY: I certify, that based on my findings, NURSING services are a medically necessary home health service. HOME BOUND STATUS: I certify that my clinical findings support that this patient is homebound (i.e., Due to illness or injury, pt requires aid of supportive devices such as crutches, cane, wheelchairs, walkers, the use of  special transportation or the assistance of another person to leave their place of residence. There is a normal inability to leave the home and doing so requires considerable and taxing effort. Other absences are for medical reasons / religious services and are infrequent or of short duration when for  other reasons). If current dressing causes regression in wound condition, may D/C ordered dressing product/[name] and apply Normal Saline Moist Dressing daily until next [place] / Other MD appointment. [place] of regression in wound condition at [phone]. Please direct any NON-WOUND related issues/requests for orders to patient'[name] Primary Care Physician Wound #7 Right Gluteus: [place] [name] may visit PRN to address patient [name] wound care needs. FACE TO FACE ENCOUNTER: MEDICARE and MEDICAID PATIENTS: I certify that this patient is under my care and that I had a face-to-face encounter that meets the physician face-to-face encounter requirements with this patient on this date. The encounter with the patient was in whole or in part for the following MEDICAL CONDITION: (primary reason for [place]) MEDICAL NECESSITY: I certify, that based on my findings, NURSING services are a medically necessary home health service. HOME BOUND STATUS: I certify that my clinical findings support that this patient is homebound (i.e., Due to illness or injury, pt requires aid of supportive devices such as crutches, cane, wheelchairs, walkers, the use of special transportation or the assistance of another person to leave their place of residence. There is a normal inability to leave the home and doing so requires considerable and taxing effort. Other absences are for medical reasons / religious services and are infrequent or of short duration when for other reasons). If current dressing causes regression in wound condition, may D/C ordered dressing  product/[name] and apply Normal Saline Moist Dressing daily until next [place] / Other MD appointment. [place] of regression in wound condition at [phone]. Please direct any NON-WOUND related issues/requests for orders to patient'[name] Primary Care Physician Wound #8 Left,Lateral Foot: [place] [name] may visit PRN to address patient [name] wound care needs. FACE TO FACE ENCOUNTER: MEDICARE and MEDICAID PATIENTS: I certify that this patient is under my care and that I had a face-to-face encounter that meets the physician face-to-face encounter requirements with this patient on this date. The encounter with the patient was in whole or in part for the following MEDICAL CONDITION: (primary reason for [place]) MEDICAL NECESSITY: I certify, that based on my findings, NURSING services are a medically necessary home health service. HOME BOUND STATUS: I certify that my clinical findings support that this patient is homebound (i.e., Due to illness or injury, pt requires aid of supportive devices such as crutches, cane, wheelchairs, walkers, the use of special transportation or the assistance of another person to leave their place of residence. There is a normal inability to leave the home and doing so requires considerable and taxing effort. Other absences are for medical reasons / religious services and are infrequent or of short duration when for other reasons). If current dressing causes regression in wound condition, may D/C ordered dressing product/[name] and apply Normal Saline Moist Dressing daily until next [place] / Other MD appointment. [place] of regression in wound condition at [phone]. Please direct any NON-WOUND related issues/requests for orders to patient'[name] Primary Care Physician I'm gonna suggest at this point in time that we continue with the Current wound care measures for the next week. She  will actually be seeing Dr. Trula Slade on Monday we will see what he has to say as far as where things go going forward in regard to her left lower extremity and what can be done. She'[name] previously been told that she could not have any other  interventions I Matzke, Karlei [name]. ([id number]) don't know if this includes open surgery or not. We will have to see what Dr. Trula Slade has to say. Otherwise she is aware that potentially amputation may become necessary. Obviously that is what we are trying to avoid. Please see above for specific wound care orders. We will see patient for re-evaluation in 1 week([name]) here in the clinic. If anything worsens or changes patient will contact our office for additional recommendations. Electronic Signature([name]) Signed: 03/13/2018 5:37:52 PM By: Worthy Keeler PA-C Entered By: Worthy Keeler on 03/13/2018 15:42:03 Westrup, [name] ([id number]) -------------------------------------------------------------------------------- ROS/PFSH Details Patient Name: [name]. Date of Service: 03/13/2018 1:30 PM Medical Record Number: [id number] Patient Account Number: [id number] Date of Birth/Sex: 24-Jan-1966 (52 y.o. F) Treating RN: Montey Hora Primary Care Provider: Delight Stare Other Clinician: Referring Provider: Delight Stare Treating Provider/Extender: STONE III, HOYT Weeks in Treatment: 12 Information Obtained From Patient Wound History Do you currently have one or more open woundso Yes How many open wounds do you currently haveo 4 Approximately how long have you had your woundso 1 month and other 7 yrs How have you been treating your wound([name]) until nowo gauze Has your wound([name]) ever healed and then re-openedo No Have you had any lab work done in the past montho No Have you tested positive for an antibiotic resistant organism (MRSA, VRE)o No Have you tested positive for osteomyelitis (bone infection)o Yes Have you had any tests for circulation on your legso Yes Have  you had other problems associated with your woundso Swelling Constitutional Symptoms (General Health) Complaints and Symptoms: Negative for: Fever; Chills Eyes Medical History: Negative for: Cataracts; Glaucoma; Optic Neuritis Ear/Nose/Mouth/Throat Medical History: Negative for: Chronic sinus problems/congestion; Middle ear problems Hematologic/Lymphatic Medical History: Positive for: Anemia Negative for: Hemophilia; Human Immunodeficiency Virus; Lymphedema; Sickle Cell Disease Respiratory Complaints and Symptoms: No Complaints or Symptoms Medical History: Negative for: Aspiration; Asthma; Chronic Obstructive Pulmonary Disease (COPD); Pneumothorax; Sleep Apnea; Tuberculosis Cardiovascular Complaints and Symptoms: No Complaints or Symptoms Medical History: STANISHA, LORENZ. ([id number]) Positive for: Arrhythmia - a-fib; Coronary Artery Disease; Hypotension; Peripheral Arterial Disease Gastrointestinal Medical History: Negative for: Cirrhosis ; Colitis; Crohnos; Hepatitis A; Hepatitis B; Hepatitis C Endocrine Medical History: Positive for: Type II Diabetes Treated with: Insulin, Oral agents Blood sugar tested every day: Yes Tested : 4 times a day Blood sugar testing results: Bedtime: 190 Genitourinary Medical History: Negative for: End Stage Renal Disease Immunological Medical History: Negative for: Lupus Erythematosus; Raynaudos; Scleroderma Integumentary (Skin) Medical History: Negative for: History of Burn; History of pressure wounds Musculoskeletal Medical History: Positive for: Osteoarthritis Neurologic Medical History: Negative for: Dementia; Neuropathy; Quadriplegia; Paraplegia; Seizure Disorder Oncologic Medical History: Negative for: Received Chemotherapy; Received Radiation Psychiatric Complaints and Symptoms: No Complaints or Symptoms Medical History: Negative for: Anorexia/bulimia; Confinement Anxiety Immunizations Pneumococcal Vaccine: Received  Pneumococcal Vaccination: PERLA, ECHAVARRIA ([id number]) Implantable Devices Family and Social History Cancer: No; Diabetes: Yes - Mother,Father,Maternal Grandparents,Paternal Grandparents,Child; Heart Disease: Yes - Father,Mother,Maternal Grandparents,Paternal Grandparents; Hereditary Spherocytosis: Yes - Mother; Hypertension: Yes - Mother,Siblings,Paternal Grandparents,Maternal Grandparents; Kidney Disease: No; Lung Disease: No; Seizures: Yes - Maternal Grandparents,Paternal Grandparents; Stroke: No; Thyroid Problems: Yes - Siblings; Tuberculosis: No; Current every day smoker; Marital Status - Married; Alcohol Use: Rarely; Drug Use: No History; Caffeine Use: Moderate; Financial Concerns: No; Food, Clothing or Shelter Needs: No; Support System Lacking: No; Transportation Concerns: No; Advanced Directives: No; Patient does not want information on Advanced Directives; Do not resuscitate: No; Living Will: No; Medical  Power of Attorney: No Physician Affirmation I have reviewed and agree with the above information. Electronic Signature([name]) Signed: 03/13/2018 4:37:07 PM By: Montey Hora Signed: 03/13/2018 5:37:52 PM By: Worthy Keeler PA-C Entered By: Worthy Keeler on 03/13/2018 15:38:14 Cherry, [name] ([id number]) -------------------------------------------------------------------------------- SuperBill Details Patient Name: KINZA, GOUVEIA. Date of Service: 03/13/2018 Medical Record Number: [id number] Patient Account Number: [id number] Date of Birth/Sex: September 07, 1966 (52 y.o. 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 S31.819S Unspecified open wound of right buttock, sequela L97.312 Non-pressure chronic ulcer of right ankle with fat layer exposed Facility Procedures CPT4 Code: 06269485 Description: 46270 - WOUND CARE VISIT-LEV 5 EST PT Modifier: Quantity: 1 Physician Procedures CPT4: Description Modifier Quantity Code 3500938 99213 - WC PHYS LEVEL 3 - EST PT 1 ICD-10 Diagnosis Description H82.993Z Unspecified open wound of right buttock, sequela I70.393 Other atherosclerosis of unspecified type of bypass graft([name]) of the  extremities, bilateral legs I70.262 Atherosclerosis of native arteries of extremities with gangrene, 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

## 2018-03-23 NOTE — Progress Notes (Addendum)
PCP: Delight Stare, MD  Cardiologist: Nelva Bush, MD  EKG: 01/14/18 in EPIC  Stress test:pt denies  ECHO: 04/17/17 in EPIC  Cardiac Cath: pt denies  Chest x-ray: 1-view-12/02/17-in Epic  Patient has stopped her eliquis as instructed 03/20/18 and will continue aspirin and take day of surgery as instructed by her doctor.

## 2018-03-23 NOTE — Pre-Procedure Instructions (Addendum)
[name]  03/23/2018      [facility] [place] - [address] [place] [address] Phone: [phone] Fax: [phone]    Your procedure is scheduled on Mar 24, 2018.  Report to [facility] Admitting at 530 AM.  Call this number if you have problems the morning of surgery:  [phone]  Continue all medications as directed by your physician except follow these medication instructions before surgery below   Remember:  Do not eat food or drink liquids after midnight.  Take these medicines the morning of surgery with A SIP OF WATER  Metoprolol (lopressor) Albuterol- if needed Cetirizine (zyrtec)-if needed Cyclobenzaprine (flexeril)-if needed Doxycycline (Vibra) Omeprazole (prilosec) Immodium-if needed Hydrocodone-acetaminophen (norco)-if needed pregabalin (lyrica) zofran-if needed for nausea/vomiting Aspirin  Follow you physician's instructions on when to hold/resume xarelto  (03/20/18-last dose)    Beginning now, STOP taking any Aleve, Naproxen, Ibuprofen, Motrin, Advil, Goody's, BC's, all herbal medications, fish oil, and all vitamins   WHAT DO I DO ABOUT MY DIABETES MEDICATION?  Marland Kitchen Do not take oral diabetes medicines (pills) the morning of surgery.  . THE NIGHT BEFORE SURGERY, your normal dose of Humalog insulin with dinner (10 units) and 18 units of lantus insulin (1/2 of your normal dose).       . THE MORNING OF SURGERY, take 18 units of lantus insulin and no Humalog insulin unless your CBG is greater than 220 mg/dL, you may take  of your sliding scale (correction) dose of insulin (5 units).  . The day of surgery, do not take other diabetes injectables, including Byetta (exenatide), Bydureon (exenatide ER), Victoza (liraglutide), or Trulicity (dulaglutide).  . If your CBG is greater than 220 mg/dL, you may take  of your sliding scale (correction) dose of insulin.  Reviewed and Endorsed by [facility]  Patient Education Committee, August 2015  How to Manage Your Diabetes Before and After Surgery  Why is it important to control my blood sugar before and after surgery? . Improving blood sugar levels before and after surgery helps healing and can limit problems. . A way of improving blood sugar control is eating a healthy diet by: o  Eating less sugar and carbohydrates o  Increasing activity/exercise o  Talking with your doctor about reaching your blood sugar goals . High blood sugars (greater than 180 mg/dL) can raise your risk of infections and slow your recovery, so you will need to focus on controlling your diabetes during the weeks before surgery. . Make sure that the doctor who takes care of your diabetes knows about your planned surgery including the date and location.  How do I manage my blood sugar before surgery? . Check your blood sugar at least 4 times a day, starting 2 days before surgery, to make sure that the level is not too high or low. o Check your blood sugar the morning of your surgery when you wake up and every 2 hours until you get to the Short Stay unit. . If your blood sugar is less than 70 mg/dL, you will need to treat for low blood sugar: o Do not take insulin. o Treat a low blood sugar (less than 70 mg/dL) with  cup of clear juice (cranberry or apple), 4 glucose tablets, OR glucose gel. Recheck blood sugar in 15 minutes after treatment (to make sure it is greater than 70 mg/dL). If your blood sugar is not greater than 70 mg/dL on  recheck, call [phone] o  for further instructions. . Report your blood sugar to the short stay nurse when you get to Short Stay.  . If you are admitted to the hospital after surgery: o Your blood sugar will be checked by the staff and you will probably be given insulin after surgery (instead of oral diabetes medicines) to make sure you have good blood sugar levels. o The goal for blood sugar control after surgery is 80-180  mg/dL.  Contacts, dentures or bridgework may not be worn into surgery.  Leave your suitcase in the car.  After surgery it may be brought to your room.  For patients admitted to the hospital, discharge time will be determined by your treatment team.  Patients discharged the day of surgery will not be allowed to drive home.   [place]- Preparing For Surgery  Before surgery, you can play an important role. Because skin is not sterile, your skin needs to be as free of germs as possible. You can reduce the number of germs on your skin by washing with CHG (chlorahexidine gluconate) Soap before surgery.  CHG is an antiseptic cleaner which kills germs and bonds with the skin to continue killing germs even after washing.  Please do not use if you have an allergy to CHG or antibacterial soaps. If your skin becomes reddened/irritated stop using the CHG.  Do not shave (including legs and underarms) for at least 48 hours prior to first CHG shower. It is OK to shave your face.  Please follow these instructions carefully.   1. Shower the NIGHT BEFORE SURGERY and the MORNING OF SURGERY with CHG.   2. If you chose to wash your hair, wash your hair first as usual with your normal shampoo.  3. After you shampoo, rinse your hair and body thoroughly to remove the shampoo.  4. Use CHG as you would any other liquid soap. You can apply CHG directly to the skin and wash gently with a scrungie or a clean washcloth.   5. Apply the CHG Soap to your body ONLY FROM THE NECK DOWN.  Do not use on open wounds or open sores. Avoid contact with your eyes, ears, mouth and genitals (private parts). Wash Face and genitals (private parts)  with your normal soap.  6. Wash thoroughly, paying special attention to the area where your surgery will be performed.  7. Thoroughly rinse your body with warm water from the neck down.  8. DO NOT shower/wash with your normal soap after using and rinsing off the CHG Soap.  9. Pat  yourself dry with a CLEAN TOWEL.  10. Wear CLEAN PAJAMAS to bed the night before surgery, wear comfortable clothes the morning of surgery  11. Place CLEAN SHEETS on your bed the night of your first shower and DO NOT SLEEP WITH PETS.  Day of Surgery: Do not apply any deodorants/lotions. Please wear clean clothes to the hospital/surgery center.     Do not wear jewelry, make-up or nail polish.  Do not wear lotions, powders, or perfumes, or deodorant.  Do not shave 48 hours prior to surgery.    Do not bring valuables to the hospital.  [facility] is not responsible for any belongings or valuables.  Please read over the following fact sheets that you were given. Pain Booklet, Coughing and Deep Breathing, MRSA Information and Surgical Site Infection Prevention

## 2018-03-24 ENCOUNTER — Other Ambulatory Visit: Payer: Self-pay

## 2018-03-24 ENCOUNTER — Ambulatory Visit (HOSPITAL_COMMUNITY)
Admission: RE | Admit: 2018-03-24 | Discharge: 2018-03-24 | Disposition: A | Discharge status: Home / Self Care | Payer: Medicare Other | Source: Ambulatory Visit | Attending: Surgery | Admitting: Surgery

## 2018-03-24 ENCOUNTER — Inpatient Hospital Stay (HOSPITAL_COMMUNITY)
Admission: RE | Disposition: A | Discharge status: Home / Self Care | Payer: Self-pay | Source: Ambulatory Visit | Attending: Surgery

## 2018-03-24 ENCOUNTER — Encounter (HOSPITAL_COMMUNITY): Payer: Self-pay

## 2018-03-24 DIAGNOSIS — M1990 Unspecified osteoarthritis, unspecified site: Secondary | ICD-10-CM | POA: Insufficient documentation

## 2018-03-24 DIAGNOSIS — M549 Dorsalgia, unspecified: Secondary | ICD-10-CM | POA: Insufficient documentation

## 2018-03-24 DIAGNOSIS — L97829 Non-pressure chronic ulcer of other part of left lower leg with unspecified severity: Secondary | ICD-10-CM

## 2018-03-24 DIAGNOSIS — I70348 Atherosclerosis of unspecified type of bypass graft(s) of the left leg with ulceration of other part of lower leg: Secondary | ICD-10-CM | POA: Insufficient documentation

## 2018-03-24 DIAGNOSIS — K219 Gastro-esophageal reflux disease without esophagitis: Secondary | ICD-10-CM

## 2018-03-24 DIAGNOSIS — G8929 Other chronic pain: Secondary | ICD-10-CM | POA: Insufficient documentation

## 2018-03-24 DIAGNOSIS — F329 Major depressive disorder, single episode, unspecified: Secondary | ICD-10-CM | POA: Insufficient documentation

## 2018-03-24 DIAGNOSIS — E785 Hyperlipidemia, unspecified: Secondary | ICD-10-CM

## 2018-03-24 DIAGNOSIS — J45909 Unspecified asthma, uncomplicated: Secondary | ICD-10-CM

## 2018-03-24 DIAGNOSIS — I480 Paroxysmal atrial fibrillation: Secondary | ICD-10-CM

## 2018-03-24 DIAGNOSIS — Z794 Long term (current) use of insulin: Secondary | ICD-10-CM | POA: Insufficient documentation

## 2018-03-24 DIAGNOSIS — Z7901 Long term (current) use of anticoagulants: Secondary | ICD-10-CM | POA: Insufficient documentation

## 2018-03-24 DIAGNOSIS — Z87891 Personal history of nicotine dependence: Secondary | ICD-10-CM | POA: Insufficient documentation

## 2018-03-24 DIAGNOSIS — E1151 Type 2 diabetes mellitus with diabetic peripheral angiopathy without gangrene: Secondary | ICD-10-CM

## 2018-03-24 DIAGNOSIS — I70248 Atherosclerosis of native arteries of left leg with ulceration of other part of lower left leg: Secondary | ICD-10-CM | POA: Diagnosis not present

## 2018-03-24 DIAGNOSIS — E1140 Type 2 diabetes mellitus with diabetic neuropathy, unspecified: Secondary | ICD-10-CM

## 2018-03-24 HISTORY — PX: ABDOMINAL AORTOGRAM W/LOWER EXTREMITY: CATH118223

## 2018-03-24 LAB — COMPREHENSIVE METABOLIC PANEL
ALT: 6 U/L — ABNORMAL LOW (ref 14–54)
AST: 10 U/L — AB (ref 15–41)
Albumin: 2.3 g/dL — ABNORMAL LOW (ref 3.5–5.0)
Alkaline Phosphatase: 117 U/L (ref 38–126)
Anion gap: 8 (ref 5–15)
BUN: 11 mg/dL (ref 6–20)
CHLORIDE: 100 mmol/L — AB (ref 101–111)
CO2: 29 mmol/L (ref 22–32)
Calcium: 8.4 mg/dL — ABNORMAL LOW (ref 8.9–10.3)
Creatinine, Ser: 0.87 mg/dL (ref 0.44–1.00)
Glucose, Bld: 143 mg/dL — ABNORMAL HIGH (ref 65–99)
POTASSIUM: 4.5 mmol/L (ref 3.5–5.1)
Sodium: 137 mmol/L (ref 135–145)
Total Bilirubin: 0.4 mg/dL (ref 0.3–1.2)
Total Protein: 6.3 g/dL — ABNORMAL LOW (ref 6.5–8.1)

## 2018-03-24 LAB — CBC
HCT: 36.6 % (ref 36.0–46.0)
Hemoglobin: 11.5 g/dL — ABNORMAL LOW (ref 12.0–15.0)
MCH: 24.5 pg — ABNORMAL LOW (ref 26.0–34.0)
MCHC: 31.4 g/dL (ref 30.0–36.0)
MCV: 78 fL (ref 78.0–100.0)
Platelets: 273 K/uL (ref 150–400)
RBC: 4.69 MIL/uL (ref 3.87–5.11)
RDW: 15.6 % — ABNORMAL HIGH (ref 11.5–15.5)
WBC: 8 K/uL (ref 4.0–10.5)

## 2018-03-24 LAB — POCT I-STAT, CHEM 8
BUN: 12 mg/dL (ref 6–20)
CALCIUM ION: 1.12 mmol/L — AB (ref 1.15–1.40)
CREATININE: 0.8 mg/dL (ref 0.44–1.00)
Chloride: 99 mmol/L — ABNORMAL LOW (ref 101–111)
GLUCOSE: 194 mg/dL — AB (ref 65–99)
HCT: 37 % (ref 36.0–46.0)
HEMOGLOBIN: 12.6 g/dL (ref 12.0–15.0)
Potassium: 4.2 mmol/L (ref 3.5–5.1)
Sodium: 136 mmol/L (ref 135–145)
TCO2: 26 mmol/L (ref 22–32)

## 2018-03-24 LAB — GLUCOSE, CAPILLARY
GLUCOSE-CAPILLARY: 187 mg/dL — AB (ref 65–99)
Glucose-Capillary: 154 mg/dL — ABNORMAL HIGH (ref 65–99)

## 2018-03-24 LAB — APTT: aPTT: 32 s (ref 24–36)

## 2018-03-24 LAB — HEMOGLOBIN A1C
HEMOGLOBIN A1C: 8.4 % — AB (ref 4.8–5.6)
Mean Plasma Glucose: 194.38 mg/dL

## 2018-03-24 LAB — SURGICAL PCR SCREEN
MRSA, PCR: NEGATIVE
STAPHYLOCOCCUS AUREUS: NEGATIVE

## 2018-03-24 LAB — PROTIME-INR
INR: 1.15
Prothrombin Time: 14.7 s (ref 11.4–15.2)

## 2018-03-24 SURGERY — ABDOMINAL AORTOGRAM W/LOWER EXTREMITY
Anesthesia: LOCAL

## 2018-03-24 MED ORDER — LIDOCAINE HCL (PF) 1 % IJ SOLN
INTRAMUSCULAR | Status: AC
  Filled 2018-03-24: qty 30

## 2018-03-24 MED ORDER — OXYCODONE HCL 5 MG PO TABS
5.0000 mg | ORAL_TABLET | ORAL | Status: DC | PRN
  Administered 2018-03-24: 10 mg via ORAL

## 2018-03-24 MED ORDER — OXYCODONE HCL 5 MG PO TABS
ORAL_TABLET | ORAL | Status: AC
  Administered 2018-03-24: 10 mg via ORAL
  Filled 2018-03-24: qty 2

## 2018-03-24 MED ORDER — MIDAZOLAM HCL 2 MG/2ML IJ SOLN
INTRAMUSCULAR | Status: AC
  Filled 2018-03-24: qty 2

## 2018-03-24 MED ORDER — ONDANSETRON HCL 4 MG/2ML IJ SOLN: 4.0000 mg | Freq: Four times a day (QID) | INTRAMUSCULAR | Status: DC | PRN

## 2018-03-24 MED ORDER — ACETAMINOPHEN 325 MG PO TABS: 650.0000 mg | ORAL_TABLET | ORAL | Status: DC | PRN

## 2018-03-24 MED ORDER — CHLORHEXIDINE GLUCONATE 4 % EX LIQD: 60.0000 mL | Freq: Once | CUTANEOUS | Status: DC

## 2018-03-24 MED ORDER — HEPARIN (PORCINE) IN NACL 1000-0.9 UT/500ML-% IV SOLN
INTRAVENOUS | Status: AC
  Filled 2018-03-24: qty 500

## 2018-03-24 MED ORDER — SODIUM CHLORIDE 0.9 % IV SOLN
INTRAVENOUS | Status: DC
  Administered 2018-03-24: 10:00:00 via INTRAVENOUS

## 2018-03-24 MED ORDER — SODIUM CHLORIDE 0.9% FLUSH: 3.0000 mL | Freq: Two times a day (BID) | INTRAVENOUS | Status: DC

## 2018-03-24 MED ORDER — IODIXANOL 320 MG/ML IV SOLN
INTRAVENOUS | Status: DC | PRN
  Administered 2018-03-24: 115 mL via INTRAVENOUS

## 2018-03-24 MED ORDER — FENTANYL CITRATE (PF) 100 MCG/2ML IJ SOLN
INTRAMUSCULAR | Status: AC
  Filled 2018-03-24: qty 2

## 2018-03-24 MED ORDER — MIDAZOLAM HCL 2 MG/2ML IJ SOLN
INTRAMUSCULAR | Status: DC | PRN
  Administered 2018-03-24: 2 mg via INTRAVENOUS

## 2018-03-24 MED ORDER — LABETALOL HCL 5 MG/ML IV SOLN: 10.0000 mg | INTRAVENOUS | Status: DC | PRN

## 2018-03-24 MED ORDER — ASPIRIN EC 81 MG PO TBEC: 81.0000 mg | DELAYED_RELEASE_TABLET | Freq: Every day | ORAL | Status: DC

## 2018-03-24 MED ORDER — FENTANYL CITRATE (PF) 100 MCG/2ML IJ SOLN
INTRAMUSCULAR | Status: DC | PRN
  Administered 2018-03-24: 50 ug via INTRAVENOUS

## 2018-03-24 MED ORDER — HEPARIN (PORCINE) IN NACL 2-0.9 UNITS/ML
INTRAMUSCULAR | Status: AC | PRN
  Administered 2018-03-24 (×2): 500 mL

## 2018-03-24 MED ORDER — LIDOCAINE HCL (PF) 1 % IJ SOLN
INTRAMUSCULAR | Status: DC | PRN
  Administered 2018-03-24: 20 mL

## 2018-03-24 MED ORDER — ONDANSETRON HCL 4 MG/2ML IJ SOLN
INTRAMUSCULAR | Status: DC | PRN
  Administered 2018-03-24: 4 mg via INTRAVENOUS

## 2018-03-24 MED ORDER — MORPHINE SULFATE (PF) 10 MG/ML IV SOLN
2.0000 mg | INTRAVENOUS | Status: DC | PRN
  Administered 2018-03-24: 2 mg via INTRAVENOUS

## 2018-03-24 MED ORDER — HYDRALAZINE HCL 20 MG/ML IJ SOLN: 5.0000 mg | INTRAMUSCULAR | Status: DC | PRN

## 2018-03-24 MED ORDER — ONDANSETRON HCL 4 MG/2ML IJ SOLN
INTRAMUSCULAR | Status: AC
  Filled 2018-03-24: qty 2

## 2018-03-24 MED ORDER — SODIUM CHLORIDE 0.9 % WEIGHT BASED INFUSION: 1.0000 mL/kg/h | INTRAVENOUS | Status: DC

## 2018-03-24 MED ORDER — SODIUM CHLORIDE 0.9 % IV SOLN: 250.0000 mL | INTRAVENOUS | Status: DC | PRN

## 2018-03-24 MED ORDER — MORPHINE SULFATE (PF) 4 MG/ML IV SOLN
INTRAVENOUS | Status: AC
  Filled 2018-03-24: qty 1

## 2018-03-24 MED ORDER — SODIUM CHLORIDE 0.9% FLUSH: 3.0000 mL | INTRAVENOUS | Status: DC | PRN

## 2018-03-24 SURGICAL SUPPLY — 12 items
CATH ANGIO 5F PIGTAIL 65CM (CATHETERS) ×1 IMPLANT
COVER PRB 48X5XTLSCP FOLD TPE (BAG) IMPLANT
COVER PROBE 5X48 (BAG) ×2
DRAPE ZERO GRAVITY STERILE (DRAPES) ×1 IMPLANT
KIT MICROPUNCTURE NIT STIFF (SHEATH) ×1 IMPLANT
KIT PV (KITS) ×2 IMPLANT
SHEATH PINNACLE 5F 10CM (SHEATH) ×1 IMPLANT
SYR MEDRAD MARK V 150ML (SYRINGE) ×2 IMPLANT
TRANSDUCER W/STOPCOCK (MISCELLANEOUS) ×2 IMPLANT
TRAY PV CATH (CUSTOM PROCEDURE TRAY) ×2 IMPLANT
WIRE BENTSON .035X145CM (WIRE) ×1 IMPLANT
WIRE TORQFLEX AUST .018X40CM (WIRE) ×1 IMPLANT

## 2018-03-24 NOTE — Op Note (Signed)
    Patient name: [name] MRN: [id number] DOB: Dec 15, 1965 Sex: female  03/24/2018 Pre-operative Diagnosis: left leg ulcer Post-operative diagnosis:  Same Surgeon:  [name] Procedure Performed:  1.  U/s guided access left femoral aartery  2.  Abdominal aortogram  3.  Bilateral lower extremity runoff  4.  Conscious sedation (>15 Min)    Indications: The patient has had numerous vascular interventions in the past.  Most recently she underwent an aortobifemoral bypass graft.  She had revision of her left femoral-popliteal bypass graft which is now occluded.  She has ulcers on her left foot and she is here today for diagnostic arteriogram for surgical planning.  Procedure:  The patient was identified in the holding area and taken to room 8.  The patient was then placed supine on the table and prepped and draped in the usual sterile fashion.  A time out was called.  Conscious sedation was administered with the use of IV fentanyl and Versed under continuous physician and nurse monitoring.  Heart rate, blood pressure, and oxygen saturation were to continue to monitor.  Ultrasound was used to evaluate the left common femoral artery.  It was patent .  A digital ultrasound image was acquired.  A micropuncture needle was used to access the left common femoral artery under ultrasound guidance.  An 018 wire was advanced without resistance and a micropuncture sheath was placed.  The 018 wire was removed and a benson wire was placed.  The micropuncture sheath was exchanged for a 5 french sheath.  A pigtail catheter was advanced over the wire to the level of L-1.  An abdominal angiogram was obtained.  The pigtail catheter was then pulled down to the bifurcation and bilateral lower extremity runoff was performed.  Findings:   Aortogram: Bilateral renal arteries are widely patent without significant stenosis.  The aortic anastomosis is widely patent however there is a significant size mismatch.  Bilateral  limbs to the left and right femoral artery are widely patent.  Right Lower Extremity: The right femoral-popliteal bypass graft is widely patent.  There is three-vessel runoff  Left Lower Extremity: The left superficial femoral artery is occluded.  There is reconstitution of the posterior tibial and peroneal artery.  The posterior tibials dominant vessel across the ankle  Intervention: None  Impression:  #1 patent aortobifemoral bypass graft  #2 patent right femoral-popliteal bypass graft  #3 occluded left femoral-popliteal bypass graft with reconstitution of the posterior tibial and peroneal artery.  The posterior tibial is a dominant vessel across the ankle.  #4 patient to be scheduled for redo left femoral posterior tibial bypass    V. [name], M.D. [facility] Office: [phone] Pager:  [phone]

## 2018-03-24 NOTE — Progress Notes (Signed)
Pt advises the blood pressure in the left arm is inaccurate because of a blockage. Pt. Advised to place blood pressure cuff on right arm. Blood pressure is 113/56 on right arm.

## 2018-03-24 NOTE — Progress Notes (Signed)
5 fr sheath removed from lfa site. Minimal pressure held to achieve hemostasis. Manual pressure held x 30 min with hemostasis achieved to lfa site. Pt. Tolerated manual hold very well.A dry sterile dressing applied to lfa site . Pt. Given instructions (verbal) with repeat back. lfa site is a level 0, pt. advised if he laughs, coughs, sneezes or bears down to hold pressure to lfa site. Pt. Hand guided to lfa site and pt. Successfully demonstrates how to do with verbalization of understanding in repeated back. Pt. pero- marked and with very faint doppler sound of pulse.  Awaiting short stay bed assignment.

## 2018-03-24 NOTE — Progress Notes (Signed)
Report to D. Anderson , LFA site -stable- no hematoma, no ecchymosis.  Pt. Did not eat all of her sandwich.

## 2018-03-24 NOTE — Progress Notes (Signed)
r dp with doppler, l pero- very difficult to hear with the doppler. Pt. Denies c/o pain.

## 2018-03-24 NOTE — Discharge Instructions (Signed)
° °  [facility]  Discharge Instructions  Lower Extremity Angiogram; Angioplasty/Stenting  Please refer to the following instructions for your post-procedure care. Your surgeon or physician assistant will discuss any changes with you.  Activity  Avoid lifting more than 8 pounds (1 gallons of milk) for 72 hours (3 days) after your procedure. You may walk as much as you can tolerate. It's OK to drive after 72 hours.  Bathing/Showering  You may shower the day after your procedure. If you have a bandage, you may remove it at 24- 48 hours. Clean your incision site with mild soap and water. Pat the area dry with a clean towel.  Diet  Drink plenty of fluids to stay hydrated over the next 2-3 days. Resume your pre-procedure diet. There are no special food restrictions following this procedure. All patients with peripheral vascular disease should follow a low fat/low cholesterol diet. In order to heal from your surgery, it is CRITICAL to get adequate nutrition. Your body requires vitamins, minerals, and protein. Vegetables are the best source of vitamins and minerals. Vegetables also provide the perfect balance of protein. Processed food has little nutritional value, so try to avoid this.  Medications  Resume taking all of your medications unless your doctor tells you not to. If your incision is causing pain, you may take over-the-counter pain relievers such as acetaminophen (Tylenol)  Follow Up  Follow up will be arranged at the time of your procedure. You may have an office visit scheduled or may be scheduled for surgery. Ask your surgeon if you have any questions.  Please call us immediately for any of the following conditions: Severe or worsening pain your legs or feet at rest or with walking. Increased pain, redness, drainage at your groin puncture site. Fever of 101 degrees or higher. If you have any mild or slow bleeding from your puncture site: lie  down, apply firm constant pressure over the area with a piece of gauze or a clean wash cloth for 30 minutes- no peeking!, call 911 right away if you are still bleeding after 30 minutes, or if the bleeding is heavy and unmanageable.  Reduce your risk factors of vascular disease:  Stop smoking. If you would like help call [facility] at 1-800-QUIT-NOW (620)135-4847) or [place] at 606-538-3329. Manage your cholesterol Maintain a desired weight Control your diabetes Keep your blood pressure down  If you have any questions, please call the office at [phone]

## 2018-03-25 ENCOUNTER — Encounter (HOSPITAL_COMMUNITY): Payer: Self-pay | Admitting: Surgery

## 2018-03-25 MED FILL — Morphine Sulfate Inj 4 MG/ML: INTRAMUSCULAR | Qty: 0.5 | Status: AC

## 2018-03-25 MED FILL — Heparin Sod (Porcine)-NaCl IV Soln 1000 Unit/500ML-0.9%: INTRAVENOUS | Qty: 1000 | Status: AC

## 2018-03-25 NOTE — Interval H&P Note (Signed)
History and Physical Interval Note:  03/25/2018 4:44 PM  [name]  has presented today for surgery, with the diagnosis of pvd  The various methods of treatment have been discussed with the patient and family. After consideration of risks, benefits and other options for treatment, the patient has consented to  Procedure(s): ABDOMINAL AORTOGRAM W/LOWER EXTREMITY (N/A) as a surgical intervention .  The patient's history has been reviewed, patient examined, no change in status, stable for surgery.  I have reviewed the patient's chart and labs.  Questions were answered to the patient's satisfaction.     [name]

## 2018-03-25 NOTE — Progress Notes (Signed)
Anesthesia Chart Review:   Case:  [id number] Date/Time:  03/27/18 0715   Procedure:  BYPASS GRAFT FEMORAL-TIBIAL ARTERY LEFT REDO (Left )   Anesthesia type:  Choice   Pre-op diagnosis:  PERIPHERAL VASCULAR DISEASE   Location:  [facility] OR ROOM 70 / [place] OR   Surgeon:  Serafina Mitchell, MD      DISCUSSION:  - Pt is a 52 year old female with hx atrial fibrillation, PAD.   - Per Dr. Stephens Shire 03/16/18 note:  1. "She is status post bilateral femoral-popliteal bypass grafts for ulcer disease. She has had multiple interventions for recurrent stenosis. 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 Because of the infectious issues, the patient's revascularization is been delayed. Ultimately on 11/28/2017 she underwent an aortobifemoral bypass graft, and thrombectomy and bilateral femoral-popliteal bypass grafts. She had to go back to the operating room the following day for acute left leg ischemia and had a left popliteal and tibioperoneal trunk endarterectomy with lesser saphenous vein patch. She was recently in the emergency department because her providers were concerned her wound to gotten worse.  While there she had a CT angiogram which showed that the left femoral-popliteal bypass graft had occluded.  The patient does not feel like there has been any change." 2. "The patient is at risk for limb loss of the left leg secondary to an adequate perfusion.  She now has an occluded left femoral-popliteal bypass graft.  I think we can attempt one more time at revascularization"    PROVIDERS: - PCP is [name], MD - Cardiologist is Nelva Bush, MD. Last office visit 01/14/18   LABS: Labs reviewed: Acceptable for surgery. (all labs ordered are listed, but only abnormal results are displayed)  Labs Reviewed   CBC - Abnormal; Notable for the following components:      Result Value   Hemoglobin 11.5 (*)    MCH 24.5 (*)    RDW 15.6 (*)    All other components within normal limits  COMPREHENSIVE METABOLIC PANEL - Abnormal; Notable for the following components:   Chloride 100 (*)    Glucose, Bld 143 (*)    Calcium 8.4 (*)    Total Protein 6.3 (*)    Albumin 2.3 (*)    AST 10 (*)    ALT 6 (*)    All other components within normal limits  HEMOGLOBIN A1C - Abnormal; Notable for the following components:   Hgb A1c MFr Bld 8.4 (*)    All other components within normal limits  SURGICAL PCR SCREEN  APTT  PROTIME-INR  TYPE AND SCREEN     IMAGES:  1 view CXR 12/02/17:  1. The appearance the chest suggests a background of mild interstitial pulmonary edema. 2. Small left pleural effusion.   CT angio ao+bifem 02/27/18:  - VASCULAR 1. Left femoral-popliteal bypass graft is occluded. Arterial flow to the left lower extremity is supplied through the deep femoral branches. Reconstitution of the left runoff vessels. 2. Status post aortobifemoral bypass. Aortobifemoral bypass graft is patent. 3. Revision of the right femoral-popliteal bypass graft. The right femoral-popliteal bypass graft is patent. 4. Three-vessel runoff in the right lower extremity. - NON-VASCULAR 1. No acute abnormality in the abdomen or pelvis. 2. An incidental finding of potential clinical significance has been found. Again noted are hypodensities in the  spleen which are poorly characterized due to the phase of contrast. Similar findings have been noted dating back to the CT of the abdomen on 09/30/2016. Favor benign etiology but these hypodensities are indeterminate. These small splenic lesions could be better characterized with abdominal MRI, with and without contrast. 3. Indeterminate 3 mm nodular density in the right lower lobe. No follow-up needed if patient is low-risk (and has no known or suspected primary neoplasm).  Non-contrast chest CT can be considered in 12 months if patient is high-risk   EKG 01/14/18: Sinus rhythm with occasional PVCs. Low voltage QRS.  Cannot rule out anterior infarct, age undetermined   CV:  Carotid duplex 10/06/17:  1. 40-59% R ICA stenosis 2. 40-59% L ICA stenosis 3. B >50% external carotid stenosis  Echo 04/17/17:  - Technically difficult study due to chest wall and/or lung interference. - Left ventricle: The cavity size was normal. Wall thickness was increased in a pattern of mild LVH. Systolic function was normal. The estimated ejection fraction was in the range of 55% to 60%. The study is not technically sufficient to allow evaluation of LV diastolic function. - Mitral valve: Calcified annulus. Mildly thickened leaflets . - Right ventricle: The cavity size was normal. Systolic function was normal. - Pericardium, extracardiac: A trivial pericardial effusion was identified.  Nuclear stress test 03/12/17:  - Pharmacological myocardial perfusion imaging study with no significant  Ischemia - Large region of fixed perfusion defect in the basal to mid inferior wall, inferior lateral mid to distal wall concerning for previous MI  - Hypokinesis noted in the basal to mid inferior wall,  EF estimated at 43% - No EKG changes concerning for ischemia at peak stress or in recovery. - Moderate to high risk and given size of perfusion defect detailed above - Dr. Saunders Revel documents in his 06/25/17 note: "Subsequent echo showed preserved LVEF. EKG today does not show any inferior Q waves. I suspect that the fixed defect was most likely due to attenuation artifact. Additional cardiac testing or intervention at this time would not further mitigate her perioperative cardiovascular risk for any future vascular surgical procedures. Continued secondary prevention in the setting of advanced PVD is recommended, including aspirin and high intensity statin therapy."    Past Medical History:  Diagnosis  Date  . Arthritis   . Asthma   . Chronic back pain   . Depression   . Diabetes mellitus   . Diabetic neuropathy ([place])   . Family history of adverse reaction to anesthesia    mother had difficlty waking   . Femoral-popliteal bypass graft occlusion, left ([place]) 12/02/2017  . GERD (gastroesophageal reflux disease)   . Hyperlipidemia   . Osteomyelitis of right fibula ([place]) 03/05/2017  . PAD (peripheral artery disease) ([place])   . Paroxysmal atrial fibrillation with rapid ventricular response ([place]) 12/02/2017  . Ulcer    Foot    Past Surgical History:  Procedure Laterality Date  . ABDOMINAL AORTAGRAM  June 15, 2014  . ABDOMINAL AORTAGRAM N/A 06/15/2014   Procedure: ABDOMINAL Maxcine Ham;  Surgeon: Serafina Mitchell, MD;  Location: [facility] CATH LAB;  Service: Cardiovascular;  Laterality: N/A;  . ABDOMINAL AORTAGRAM N/A 11/22/2014   Procedure: ABDOMINAL AORTAGRAM;  Surgeon: Serafina Mitchell, MD;  Location: [facility] CATH LAB;  Service: Cardiovascular;  Laterality: N/A;  . ABDOMINAL AORTOGRAM W/LOWER EXTREMITY N/A 01/07/2017   Procedure: Abdominal Aortogram w/Lower Extremity;  Surgeon: Serafina Mitchell, MD;  Location: [place] CV LAB;  Service: Cardiovascular;  Laterality: N/A;  .  ABDOMINAL AORTOGRAM W/LOWER EXTREMITY N/A 10/31/2017   Procedure: ABDOMINAL AORTOGRAM W/LOWER EXTREMITY;  Surgeon: Elam Dutch, MD;  Location: [place] CV LAB;  Service: Cardiovascular;  Laterality: N/A;  . ABDOMINAL AORTOGRAM W/LOWER EXTREMITY N/A 03/24/2018   Procedure: ABDOMINAL AORTOGRAM W/LOWER EXTREMITY;  Surgeon: Serafina Mitchell, MD;  Location: [place] CV LAB;  Service: Cardiovascular;  Laterality: N/A;  . AORTA - BILATERAL FEMORAL ARTERY BYPASS GRAFT N/A 11/28/2017   Procedure: AORTA BIFEMORAL BYPASS USING HEMASHIELD GOLD GRAFT & REIMPLANT IMA;  Surgeon: Serafina Mitchell, MD;  Location: [facility] OR;  Service: Vascular;  Laterality: N/A;  . AORTIC ARCH ANGIOGRAPHY N/A 10/31/2017   Procedure: AORTIC ARCH ANGIOGRAPHY;  Surgeon:  Elam Dutch, MD;  Location: [place] CV LAB;  Service: Cardiovascular;  Laterality: N/A;  . APPLICATION OF WOUND VAC  11/28/2017   Procedure: APPLICATION OF WOUND VAC;  Surgeon: Serafina Mitchell, MD;  Location: [facility] OR;  Service: Vascular;;  . ARTERIAL BYPASS SURGRY  07/05/2010   Right Common Femoral to below knee popliteal BPG  . BACK SURGERY     X's  2  . CARDIAC CATHETERIZATION    . CHOLECYSTECTOMY     Gall Bladder  . CYSTECTOMY Right    foot  . CYSTECTOMY Left    wrist  . EMBOLECTOMY Left 11/28/2017   Procedure: Left Lower Extremity Embolectomy, Left Tibial Peroneal Trunk Endarterectomy with Patch Angioplasty; Vein Harvest Small Saphenous Graft Left Lower Leg;  Surgeon: [name], MD;  Location: [place];  Service: Vascular;  Laterality: Left;  . INTERCOSTAL NERVE BLOCK  November 2015  . INTRAOPERATIVE ARTERIOGRAM  11/28/2017   Procedure: INTRA OPERATIVE ARTERIOGRAM OF LEFT LEG;  Surgeon: Serafina Mitchell, MD;  Location: [place];  Service: Vascular;;  . IR FLUORO GUIDE CV LINE RIGHT  03/20/2017  . IR US GUIDE VASC ACCESS RIGHT  03/20/2017  . IRRIGATION AND DEBRIDEMENT BUTTOCKS Right 09/30/2016   Procedure: DEBRIDEMENT RIGHT  BUTTOCK WOUND;  Surgeon: Georganna Skeans, MD;  Location: [place];  Service: General;  Laterality: Right;  . left foot surgery    . left wrist cyst removal Left   . PERIPHERAL VASCULAR CATHETERIZATION N/A 05/07/2016   Procedure: Abdominal Aortogram;  Surgeon: Serafina Mitchell, MD;  Location: [place] CV LAB;  Service: Cardiovascular;  Laterality: N/A;  . PERIPHERAL VASCULAR CATHETERIZATION N/A 05/07/2016   Procedure: Lower Extremity Angiography;  Surgeon: Serafina Mitchell, MD;  Location: [place] CV LAB;  Service: Cardiovascular;  Laterality: N/A;  . PERIPHERAL VASCULAR CATHETERIZATION N/A 05/07/2016   Procedure: Aortic Arch Angiography;  Surgeon: Serafina Mitchell, MD;  Location: [place] CV LAB;  Service: Cardiovascular;  Laterality: N/A;  .  PERIPHERAL VASCULAR CATHETERIZATION N/A 05/07/2016   Procedure: Upper Extremity Angiography;  Surgeon: Serafina Mitchell, MD;  Location: [place] CV LAB;  Service: Cardiovascular;  Laterality: N/A;  . PERIPHERAL VASCULAR CATHETERIZATION Right 05/07/2016   Procedure: Peripheral Vascular Balloon Angioplasty;  Surgeon: Serafina Mitchell, MD;  Location: [place] CV LAB;  Service: Cardiovascular;  Laterality: Right;  subclavian  . PERIPHERAL VASCULAR CATHETERIZATION Right 05/07/2016   Procedure: Peripheral Vascular Intervention;  Surgeon: Serafina Mitchell, MD;  Location: [place] CV LAB;  Service: Cardiovascular;  Laterality: Right;  External  Iliac  . SKIN GRAFT Right 2012   [name] by Dr. Nils Pyle- Right and Left Ankle  . SPINE SURGERY    . THROMBECTOMY FEMORAL ARTERY  11/28/2017   Procedure: THROMBECTOMY  & REVISION OF BILATERAL  FEMORAL TO POPLETEAL ARTERIES;  Surgeon: Serafina Mitchell, MD;  [facility];  Service: Vascular;;  . TONSILLECTOMY      MEDICATIONS: . albuterol (PROVENTIL) 2 MG tablet  . aspirin EC 81 MG tablet  . cetirizine (ZYRTEC) 10 MG tablet  . cyclobenzaprine (FLEXERIL) 10 MG tablet  . doxycycline (VIBRA-TABS) 100 MG tablet  . HUMALOG KWIKPEN 100 UNIT/ML KiwkPen  . HYDROcodone-acetaminophen (NORCO) 10-325 MG tablet  . LANTUS SOLOSTAR 100 UNIT/ML Solostar Pen  . loperamide (IMODIUM A-D) 2 MG tablet  . metoprolol tartrate (LOPRESSOR) 25 MG tablet  . omeprazole (PRILOSEC) 20 MG capsule  . ondansetron (ZOFRAN ODT) 4 MG disintegrating tablet  . pregabalin (LYRICA) 100 MG capsule  . rivaroxaban (XARELTO) 20 MG TABS tablet  . rosuvastatin (CRESTOR) 20 MG tablet   No current facility-administered medications for this encounter.     If no changes, I anticipate pt can proceed with surgery as scheduled.   Willeen Cass, FNP-BC [facility]/Anesthesiology Phone: [phone] 03/25/2018 12:31 PM

## 2018-03-27 ENCOUNTER — Encounter (HOSPITAL_COMMUNITY): Payer: Self-pay | Admitting: Surgery

## 2018-03-27 ENCOUNTER — Inpatient Hospital Stay (HOSPITAL_COMMUNITY): Payer: Medicare Other

## 2018-03-27 ENCOUNTER — Encounter (HOSPITAL_COMMUNITY)
Admission: RE | Disposition: A | Discharge status: Home Health | Payer: Self-pay | Source: Ambulatory Visit | Attending: Surgery

## 2018-03-27 ENCOUNTER — Ambulatory Visit: Payer: Medicare Other | Admitting: Physician Assistant

## 2018-03-27 ENCOUNTER — Inpatient Hospital Stay (HOSPITAL_COMMUNITY): Payer: Medicare Other | Admitting: Certified Registered"

## 2018-03-27 ENCOUNTER — Inpatient Hospital Stay (HOSPITAL_COMMUNITY)
Admission: RE | Admit: 2018-03-27 | Discharge: 2018-03-31 | DRG: 254 | Disposition: A | Discharge status: Home Health | Payer: Medicare Other | Source: Ambulatory Visit | Attending: Surgery | Admitting: Surgery

## 2018-03-27 ENCOUNTER — Inpatient Hospital Stay (HOSPITAL_COMMUNITY): Payer: Medicare Other | Admitting: Emergency Medicine

## 2018-03-27 ENCOUNTER — Other Ambulatory Visit: Payer: Self-pay

## 2018-03-27 DIAGNOSIS — Z794 Long term (current) use of insulin: Secondary | ICD-10-CM | POA: Diagnosis not present

## 2018-03-27 DIAGNOSIS — Z79899 Other long term (current) drug therapy: Secondary | ICD-10-CM | POA: Diagnosis not present

## 2018-03-27 DIAGNOSIS — I9581 Postprocedural hypotension: Secondary | ICD-10-CM | POA: Diagnosis not present

## 2018-03-27 DIAGNOSIS — I70248 Atherosclerosis of native arteries of left leg with ulceration of other part of lower left leg: Secondary | ICD-10-CM | POA: Diagnosis not present

## 2018-03-27 DIAGNOSIS — I739 Peripheral vascular disease, unspecified: Secondary | ICD-10-CM | POA: Diagnosis present

## 2018-03-27 DIAGNOSIS — T82858A Stenosis of vascular prosthetic devices, implants and grafts, initial encounter: Secondary | ICD-10-CM | POA: Diagnosis present

## 2018-03-27 DIAGNOSIS — Z87891 Personal history of nicotine dependence: Secondary | ICD-10-CM | POA: Diagnosis not present

## 2018-03-27 DIAGNOSIS — E1151 Type 2 diabetes mellitus with diabetic peripheral angiopathy without gangrene: Secondary | ICD-10-CM | POA: Diagnosis present

## 2018-03-27 DIAGNOSIS — L97529 Non-pressure chronic ulcer of other part of left foot with unspecified severity: Secondary | ICD-10-CM | POA: Diagnosis present

## 2018-03-27 DIAGNOSIS — T82868A Thrombosis of vascular prosthetic devices, implants and grafts, initial encounter: Secondary | ICD-10-CM | POA: Diagnosis not present

## 2018-03-27 DIAGNOSIS — Y832 Surgical operation with anastomosis, bypass or graft as the cause of abnormal reaction of the patient, or of later complication, without mention of misadventure at the time of the procedure: Secondary | ICD-10-CM | POA: Diagnosis present

## 2018-03-27 DIAGNOSIS — Z7982 Long term (current) use of aspirin: Secondary | ICD-10-CM

## 2018-03-27 DIAGNOSIS — Z452 Encounter for adjustment and management of vascular access device: Secondary | ICD-10-CM

## 2018-03-27 DIAGNOSIS — Z419 Encounter for procedure for purposes other than remedying health state, unspecified: Secondary | ICD-10-CM

## 2018-03-27 HISTORY — PX: APPLICATION OF WOUND VAC: SHX5189

## 2018-03-27 HISTORY — PX: INTRAOPERATIVE ARTERIOGRAM: SHX5157

## 2018-03-27 HISTORY — PX: FEMORAL-POPLITEAL BYPASS GRAFT: SHX937

## 2018-03-27 HISTORY — PX: FEMORAL-TIBIAL BYPASS GRAFT: SHX938

## 2018-03-27 LAB — CBC
HCT: 28.3 % — ABNORMAL LOW (ref 36.0–46.0)
HCT: 27.8 % — ABNORMAL LOW (ref 36.0–46.0)
Hemoglobin: 8.7 g/dL — ABNORMAL LOW (ref 12.0–15.0)
Hemoglobin: 8.8 g/dL — ABNORMAL LOW (ref 12.0–15.0)
MCH: 23.8 pg — AB (ref 26.0–34.0)
MCH: 24.7 pg — ABNORMAL LOW (ref 26.0–34.0)
MCHC: 30.7 g/dL (ref 30.0–36.0)
MCHC: 31.7 g/dL (ref 30.0–36.0)
MCV: 77.5 fL — ABNORMAL LOW (ref 78.0–100.0)
MCV: 78.1 fL (ref 78.0–100.0)
PLATELETS: 326 10*3/uL (ref 150–400)
Platelets: 304 10*3/uL (ref 150–400)
RBC: 3.65 MIL/uL — AB (ref 3.87–5.11)
RBC: 3.56 MIL/uL — ABNORMAL LOW (ref 3.87–5.11)
RDW: 15.8 % — AB (ref 11.5–15.5)
RDW: 15.7 % — ABNORMAL HIGH (ref 11.5–15.5)
WBC: 11.1 10*3/uL — ABNORMAL HIGH (ref 4.0–10.5)
WBC: 11.8 10*3/uL — ABNORMAL HIGH (ref 4.0–10.5)

## 2018-03-27 LAB — URINALYSIS, ROUTINE W REFLEX MICROSCOPIC
Bilirubin Urine: NEGATIVE
GLUCOSE, UA: NEGATIVE mg/dL
KETONES UR: NEGATIVE mg/dL
NITRITE: NEGATIVE
Protein, ur: 30 mg/dL — AB
Specific Gravity, Urine: 1.004 — ABNORMAL LOW (ref 1.005–1.030)
pH: 6 (ref 5.0–8.0)

## 2018-03-27 LAB — POCT I-STAT 4, (NA,K, GLUC, HGB,HCT)
GLUCOSE: 137 mg/dL — AB (ref 65–99)
HEMATOCRIT: 30 % — AB (ref 36.0–46.0)
Hemoglobin: 10.2 g/dL — ABNORMAL LOW (ref 12.0–15.0)
Potassium: 3.6 mmol/L (ref 3.5–5.1)
Sodium: 141 mmol/L (ref 135–145)

## 2018-03-27 LAB — POCT I-STAT 7, (LYTES, BLD GAS, ICA,H+H)
ACID-BASE DEFICIT: 1 mmol/L (ref 0.0–2.0)
Acid-base deficit: 2 mmol/L (ref 0.0–2.0)
BICARBONATE: 24.3 mmol/L (ref 20.0–28.0)
Bicarbonate: 23.1 mmol/L (ref 20.0–28.0)
CALCIUM ION: 1.12 mmol/L — AB (ref 1.15–1.40)
Calcium, Ion: 1.11 mmol/L — ABNORMAL LOW (ref 1.15–1.40)
HCT: 43 % (ref 36.0–46.0)
HEMATOCRIT: 42 % (ref 36.0–46.0)
HEMOGLOBIN: 14.3 g/dL (ref 12.0–15.0)
HEMOGLOBIN: 14.6 g/dL (ref 12.0–15.0)
O2 Saturation: 99 %
O2 Saturation: 99 %
PCO2 ART: 40.5 mmHg (ref 32.0–48.0)
PH ART: 7.373 (ref 7.350–7.450)
PO2 ART: 124 mmHg — AB (ref 83.0–108.0)
POTASSIUM: 3.5 mmol/L (ref 3.5–5.1)
Potassium: 3.7 mmol/L (ref 3.5–5.1)
SODIUM: 138 mmol/L (ref 135–145)
Sodium: 139 mmol/L (ref 135–145)
TCO2: 24 mmol/L (ref 22–32)
TCO2: 26 mmol/L (ref 22–32)
pCO2 arterial: 41.8 mmHg (ref 32.0–48.0)
pH, Arterial: 7.365 (ref 7.350–7.450)
pO2, Arterial: 164 mmHg — ABNORMAL HIGH (ref 83.0–108.0)

## 2018-03-27 LAB — BASIC METABOLIC PANEL WITH GFR
Anion gap: 8 (ref 5–15)
BUN: 9 mg/dL (ref 6–20)
CO2: 24 mmol/L (ref 22–32)
Calcium: 7.4 mg/dL — ABNORMAL LOW (ref 8.9–10.3)
Chloride: 109 mmol/L (ref 101–111)
Creatinine, Ser: 0.83 mg/dL (ref 0.44–1.00)
GFR calc Af Amer: >60 mL/min
GFR calc non Af Amer: >60 mL/min
Glucose, Bld: 123 mg/dL — ABNORMAL HIGH (ref 65–99)
Potassium: 4.4 mmol/L (ref 3.5–5.1)
Sodium: 141 mmol/L (ref 135–145)

## 2018-03-27 LAB — GLUCOSE, CAPILLARY
GLUCOSE-CAPILLARY: 133 mg/dL — AB (ref 65–99)
Glucose-Capillary: 121 mg/dL — ABNORMAL HIGH (ref 65–99)
Glucose-Capillary: 132 mg/dL — ABNORMAL HIGH (ref 65–99)
Glucose-Capillary: 116 mg/dL — ABNORMAL HIGH (ref 65–99)

## 2018-03-27 LAB — POCT ACTIVATED CLOTTING TIME
ACTIVATED CLOTTING TIME: 186 s
Activated Clotting Time: 208 seconds
Activated Clotting Time: 208 seconds
Activated Clotting Time: 219 seconds
Activated Clotting Time: 186 seconds
Activated Clotting Time: 186 seconds
Activated Clotting Time: 252 s

## 2018-03-27 LAB — PREPARE RBC (CROSSMATCH)

## 2018-03-27 LAB — APTT: aPTT: 28 seconds (ref 24–36)

## 2018-03-27 LAB — TROPONIN I

## 2018-03-27 SURGERY — BYPASS GRAFT FEMORAL-POPLITEAL ARTERY
Anesthesia: General | Site: Leg Lower | Laterality: Left

## 2018-03-27 SURGERY — CREATION, BYPASS, ARTERIAL, FEMORAL TO TIBIAL, USING GRAFT
Anesthesia: General | Site: Leg Upper | Laterality: Left

## 2018-03-27 MED ORDER — FENTANYL CITRATE (PF) 100 MCG/2ML IJ SOLN
INTRAMUSCULAR | Status: DC | PRN
  Administered 2018-03-27 (×4): 25 ug via INTRAVENOUS
  Administered 2018-03-27: 100 ug via INTRAVENOUS

## 2018-03-27 MED ORDER — PAPAVERINE HCL 30 MG/ML IJ SOLN
INTRAMUSCULAR | Status: AC
  Filled 2018-03-27: qty 2

## 2018-03-27 MED ORDER — ONDANSETRON HCL 4 MG/2ML IJ SOLN: 4.0000 mg | Freq: Once | INTRAMUSCULAR | Status: DC | PRN

## 2018-03-27 MED ORDER — ALUM & MAG HYDROXIDE-SIMETH 200-200-20 MG/5ML PO SUSP: 15.0000 mL | ORAL | Status: DC | PRN

## 2018-03-27 MED ORDER — ASPIRIN EC 81 MG PO TBEC
81.0000 mg | DELAYED_RELEASE_TABLET | Freq: Every evening | ORAL | Status: DC
  Administered 2018-03-27 – 2018-03-30 (×4): 81 mg via ORAL
  Filled 2018-03-27 (×4): qty 1

## 2018-03-27 MED ORDER — PROPOFOL 10 MG/ML IV BOLUS
INTRAVENOUS | Status: DC | PRN
  Administered 2018-03-27: 110 mg via INTRAVENOUS

## 2018-03-27 MED ORDER — OXYCODONE HCL 5 MG PO TABS
5.0000 mg | ORAL_TABLET | ORAL | Status: DC | PRN
  Administered 2018-03-28 – 2018-03-31 (×8): 10 mg via ORAL
  Filled 2018-03-27 (×8): qty 2

## 2018-03-27 MED ORDER — CALCIUM CHLORIDE 10 % IV SOLN
INTRAVENOUS | Status: AC
  Filled 2018-03-27: qty 10

## 2018-03-27 MED ORDER — FENTANYL CITRATE (PF) 250 MCG/5ML IJ SOLN
INTRAMUSCULAR | Status: AC
  Filled 2018-03-27: qty 5

## 2018-03-27 MED ORDER — SODIUM CHLORIDE 0.9 % IJ SOLN
INTRAMUSCULAR | Status: DC | PRN
  Administered 2018-03-27: 15:00:00 via INTRAMUSCULAR

## 2018-03-27 MED ORDER — MAGNESIUM SULFATE 2 GM/50ML IV SOLN: 2.0000 g | Freq: Every day | INTRAVENOUS | Status: DC | PRN

## 2018-03-27 MED ORDER — ROSUVASTATIN CALCIUM 10 MG PO TABS
20.0000 mg | ORAL_TABLET | Freq: Every day | ORAL | Status: DC
  Administered 2018-03-27 – 2018-03-30 (×4): 20 mg via ORAL
  Filled 2018-03-27 (×4): qty 2

## 2018-03-27 MED ORDER — ALBUMIN HUMAN 5 % IV SOLN
INTRAVENOUS | Status: AC
  Administered 2018-03-27: 12.5 g via INTRAVENOUS
  Filled 2018-03-27: qty 250

## 2018-03-27 MED ORDER — SODIUM CHLORIDE 0.9 % IV SOLN: 10.0000 mL/h | Freq: Once | INTRAVENOUS | Status: DC

## 2018-03-27 MED ORDER — ALBUMIN HUMAN 5 % IV SOLN
INTRAVENOUS | Status: DC | PRN
  Administered 2018-03-27: 11:00:00 via INTRAVENOUS

## 2018-03-27 MED ORDER — OXYCODONE HCL 5 MG/5ML PO SOLN: 5.0000 mg | Freq: Once | ORAL | Status: DC | PRN

## 2018-03-27 MED ORDER — PHENOL 1.4 % MT LIQD: 1.0000 | OROMUCOSAL | Status: DC | PRN

## 2018-03-27 MED ORDER — EPHEDRINE SULFATE 50 MG/ML IJ SOLN
INTRAMUSCULAR | Status: DC | PRN
  Administered 2018-03-27 (×3): 5 mg via INTRAVENOUS
  Administered 2018-03-27: 80 mg via INTRAVENOUS

## 2018-03-27 MED ORDER — ALBUTEROL SULFATE 2 MG PO TABS
2.0000 mg | ORAL_TABLET | Freq: Three times a day (TID) | ORAL | Status: DC | PRN
  Filled 2018-03-27: qty 1

## 2018-03-27 MED ORDER — SUCCINYLCHOLINE CHLORIDE 200 MG/10ML IV SOSY
PREFILLED_SYRINGE | INTRAVENOUS | Status: AC
  Filled 2018-03-27: qty 20

## 2018-03-27 MED ORDER — DEXTROSE IN LACTATED RINGERS 5 % IV SOLN
INTRAVENOUS | Status: AC | PRN
  Administered 2018-03-27: 1000 mL via INTRAVENOUS

## 2018-03-27 MED ORDER — SODIUM CHLORIDE 0.9 % IV SOLN
INTRAVENOUS | Status: DC | PRN
  Administered 2018-03-27: 15:00:00

## 2018-03-27 MED ORDER — CEFAZOLIN SODIUM-DEXTROSE 1-4 GM/50ML-% IV SOLN
INTRAVENOUS | Status: DC | PRN
  Administered 2018-03-27: 2 g via INTRAVENOUS

## 2018-03-27 MED ORDER — 0.9 % SODIUM CHLORIDE (POUR BTL) OPTIME
TOPICAL | Status: DC | PRN
  Administered 2018-03-27: 2000 mL
  Administered 2018-03-27: 3000 mL

## 2018-03-27 MED ORDER — PROPOFOL 10 MG/ML IV BOLUS
INTRAVENOUS | Status: AC
Start: 2018-03-27 — End: ?
  Filled 2018-03-27: qty 40

## 2018-03-27 MED ORDER — LABETALOL HCL 5 MG/ML IV SOLN: 10.0000 mg | INTRAVENOUS | Status: DC | PRN

## 2018-03-27 MED ORDER — ONDANSETRON HCL 4 MG/2ML IJ SOLN
4.0000 mg | Freq: Four times a day (QID) | INTRAMUSCULAR | Status: DC | PRN
  Administered 2018-03-30 (×2): 4 mg via INTRAVENOUS
  Filled 2018-03-27 (×2): qty 2

## 2018-03-27 MED ORDER — ROCURONIUM BROMIDE 100 MG/10ML IV SOLN
INTRAVENOUS | Status: DC | PRN
  Administered 2018-03-27: 20 mg via INTRAVENOUS
  Administered 2018-03-27: 50 mg via INTRAVENOUS
  Administered 2018-03-27: 20 mg via INTRAVENOUS

## 2018-03-27 MED ORDER — PHENYLEPHRINE 40 MCG/ML (10ML) SYRINGE FOR IV PUSH (FOR BLOOD PRESSURE SUPPORT)
PREFILLED_SYRINGE | INTRAVENOUS | Status: AC
  Filled 2018-03-27: qty 30

## 2018-03-27 MED ORDER — GLYCOPYRROLATE 0.2 MG/ML IJ SOLN
INTRAMUSCULAR | Status: DC | PRN
  Administered 2018-03-27: .3 mg via INTRAVENOUS

## 2018-03-27 MED ORDER — SENNOSIDES-DOCUSATE SODIUM 8.6-50 MG PO TABS: 1.0000 | ORAL_TABLET | Freq: Every evening | ORAL | Status: DC | PRN

## 2018-03-27 MED ORDER — HEMOSTATIC AGENTS (NO CHARGE) OPTIME
TOPICAL | Status: DC | PRN
  Administered 2018-03-27: 1 via TOPICAL

## 2018-03-27 MED ORDER — LACTATED RINGERS IV SOLN
INTRAVENOUS | Status: DC | PRN
  Administered 2018-03-27: 07:00:00 via INTRAVENOUS

## 2018-03-27 MED ORDER — HYDROMORPHONE HCL 1 MG/ML IJ SOLN
0.5000 mg | INTRAMUSCULAR | Status: DC | PRN
  Administered 2018-03-27: 0.5 mg via INTRAVENOUS
  Administered 2018-03-27 – 2018-03-31 (×3): 1 mg via INTRAVENOUS
  Filled 2018-03-27 (×3): qty 1

## 2018-03-27 MED ORDER — PHENYLEPHRINE HCL 10 MG/ML IJ SOLN
INTRAVENOUS | Status: DC | PRN
  Administered 2018-03-27: 20 ug/min via INTRAVENOUS

## 2018-03-27 MED ORDER — NITROGLYCERIN IN D5W 200-5 MCG/ML-% IV SOLN
0.0000 ug/min | INTRAVENOUS | Status: DC
  Filled 2018-03-27: qty 250

## 2018-03-27 MED ORDER — DOCUSATE SODIUM 100 MG PO CAPS
100.0000 mg | ORAL_CAPSULE | Freq: Every day | ORAL | Status: DC
  Administered 2018-03-29 – 2018-03-31 (×2): 100 mg via ORAL
  Filled 2018-03-27 (×3): qty 1

## 2018-03-27 MED ORDER — POTASSIUM CHLORIDE CRYS ER 20 MEQ PO TBCR: 20.0000 meq | EXTENDED_RELEASE_TABLET | Freq: Every day | ORAL | Status: DC | PRN

## 2018-03-27 MED ORDER — PROPOFOL 10 MG/ML IV BOLUS
INTRAVENOUS | Status: DC | PRN
  Administered 2018-03-27: 130 mg via INTRAVENOUS

## 2018-03-27 MED ORDER — PROTAMINE SULFATE 10 MG/ML IV SOLN
INTRAVENOUS | Status: DC | PRN
  Administered 2018-03-27: 50 mg via INTRAVENOUS

## 2018-03-27 MED ORDER — CYCLOBENZAPRINE HCL 10 MG PO TABS
ORAL_TABLET | ORAL | Status: AC
  Filled 2018-03-27: qty 1

## 2018-03-27 MED ORDER — LIDOCAINE HCL (CARDIAC) PF 100 MG/5ML IV SOSY
PREFILLED_SYRINGE | INTRAVENOUS | Status: DC | PRN
  Administered 2018-03-27: 60 mg via INTRAVENOUS

## 2018-03-27 MED ORDER — INSULIN GLARGINE 100 UNIT/ML ~~LOC~~ SOLN
36.0000 [IU] | Freq: Two times a day (BID) | SUBCUTANEOUS | Status: DC
  Administered 2018-03-27 – 2018-03-30 (×7): 36 [IU] via SUBCUTANEOUS
  Filled 2018-03-27 (×8): qty 0.36

## 2018-03-27 MED ORDER — CEFAZOLIN SODIUM-DEXTROSE 2-4 GM/100ML-% IV SOLN
2.0000 g | Freq: Three times a day (TID) | INTRAVENOUS | Status: AC
  Administered 2018-03-27: 2 g via INTRAVENOUS
  Filled 2018-03-27 (×3): qty 100

## 2018-03-27 MED ORDER — SODIUM CHLORIDE 0.9 % IV SOLN
0.0000 ug/min | INTRAVENOUS | Status: DC
  Administered 2018-03-27: 15 ug/min via INTRAVENOUS

## 2018-03-27 MED ORDER — BISACODYL 5 MG PO TBEC: 5.0000 mg | DELAYED_RELEASE_TABLET | Freq: Every day | ORAL | Status: DC | PRN

## 2018-03-27 MED ORDER — PHENYLEPHRINE HCL 10 MG/ML IJ SOLN
INTRAMUSCULAR | Status: DC | PRN
  Administered 2018-03-27: 80 ug via INTRAVENOUS

## 2018-03-27 MED ORDER — ALBUMIN HUMAN 5 % IV SOLN
12.5000 g | Freq: Once | INTRAVENOUS | Status: DC
  Administered 2018-03-27: 12.5 g via INTRAVENOUS

## 2018-03-27 MED ORDER — HYDRALAZINE HCL 20 MG/ML IJ SOLN: 5.0000 mg | INTRAMUSCULAR | Status: DC | PRN

## 2018-03-27 MED ORDER — FENTANYL CITRATE (PF) 100 MCG/2ML IJ SOLN
INTRAMUSCULAR | Status: AC
  Filled 2018-03-27: qty 2

## 2018-03-27 MED ORDER — SODIUM CHLORIDE 0.9 % IV SOLN
INTRAVENOUS | Status: DC
  Administered 2018-03-27: 15:00:00 via INTRAVENOUS

## 2018-03-27 MED ORDER — PREGABALIN 100 MG PO CAPS
100.0000 mg | ORAL_CAPSULE | Freq: Three times a day (TID) | ORAL | Status: DC
  Administered 2018-03-27 – 2018-03-31 (×11): 100 mg via ORAL
  Filled 2018-03-27 (×11): qty 1

## 2018-03-27 MED ORDER — ACETAMINOPHEN 325 MG RE SUPP: 325.0000 mg | RECTAL | Status: DC | PRN

## 2018-03-27 MED ORDER — SODIUM CHLORIDE 0.9 % IV SOLN
500.0000 mL | Freq: Once | INTRAVENOUS | Status: AC | PRN
  Administered 2018-03-27: 500 mL via INTRAVENOUS

## 2018-03-27 MED ORDER — CYCLOBENZAPRINE HCL 10 MG PO TABS
10.0000 mg | ORAL_TABLET | Freq: Three times a day (TID) | ORAL | Status: DC | PRN
Start: 2018-03-27 — End: 2018-03-31
  Administered 2018-03-27: 10 mg via ORAL

## 2018-03-27 MED ORDER — LIDOCAINE 2% (20 MG/ML) 5 ML SYRINGE
INTRAMUSCULAR | Status: AC
  Filled 2018-03-27: qty 5

## 2018-03-27 MED ORDER — LACTATED RINGERS IV SOLN
INTRAVENOUS | Status: DC | PRN
  Administered 2018-03-27: 08:00:00 via INTRAVENOUS

## 2018-03-27 MED ORDER — FENTANYL CITRATE (PF) 100 MCG/2ML IJ SOLN
25.0000 ug | INTRAMUSCULAR | Status: DC | PRN
  Administered 2018-03-27 (×2): 50 ug via INTRAVENOUS

## 2018-03-27 MED ORDER — SODIUM CHLORIDE 0.9 % IV SOLN
INTRAVENOUS | Status: DC
  Administered 2018-03-27 (×2): via INTRAVENOUS

## 2018-03-27 MED ORDER — METOPROLOL TARTRATE 5 MG/5ML IV SOLN: 2.0000 mg | INTRAVENOUS | Status: DC | PRN

## 2018-03-27 MED ORDER — PHENYLEPHRINE HCL 10 MG/ML IJ SOLN
INTRAVENOUS | Status: DC | PRN
  Administered 2018-03-27: 15 ug/min via INTRAVENOUS

## 2018-03-27 MED ORDER — INSULIN ASPART 100 UNIT/ML ~~LOC~~ SOLN
10.0000 [IU] | Freq: Three times a day (TID) | SUBCUTANEOUS | Status: DC
  Administered 2018-03-30: 10 [IU] via SUBCUTANEOUS

## 2018-03-27 MED ORDER — HEPARIN SODIUM (PORCINE) 1000 UNIT/ML IJ SOLN
INTRAMUSCULAR | Status: DC | PRN
  Administered 2018-03-27 (×2): 5000 [IU] via INTRAVENOUS
  Administered 2018-03-27: 10000 [IU] via INTRAVENOUS

## 2018-03-27 MED ORDER — CEFAZOLIN SODIUM-DEXTROSE 1-4 GM/50ML-% IV SOLN: INTRAVENOUS | Status: DC | PRN

## 2018-03-27 MED ORDER — MIDAZOLAM HCL 2 MG/2ML IJ SOLN
INTRAMUSCULAR | Status: AC
  Filled 2018-03-27: qty 2

## 2018-03-27 MED ORDER — LORATADINE 10 MG PO TABS
10.0000 mg | ORAL_TABLET | Freq: Every day | ORAL | Status: DC
  Administered 2018-03-28 – 2018-03-31 (×4): 10 mg via ORAL
  Filled 2018-03-27 (×4): qty 1

## 2018-03-27 MED ORDER — FLEET ENEMA 7-19 GM/118ML RE ENEM
1.0000 | ENEMA | Freq: Once | RECTAL | Status: DC | PRN
Start: 2018-03-27 — End: 2018-03-31

## 2018-03-27 MED ORDER — FENTANYL CITRATE (PF) 100 MCG/2ML IJ SOLN
25.0000 ug | INTRAMUSCULAR | Status: DC | PRN
  Administered 2018-03-27: 100 ug via INTRAVENOUS

## 2018-03-27 MED ORDER — OXYCODONE HCL 5 MG PO TABS
5.0000 mg | ORAL_TABLET | Freq: Once | ORAL | Status: AC | PRN
  Administered 2018-03-27: 5 mg via ORAL

## 2018-03-27 MED ORDER — EPHEDRINE SULFATE 50 MG/ML IJ SOLN
INTRAMUSCULAR | Status: AC
  Filled 2018-03-27: qty 1

## 2018-03-27 MED ORDER — GUAIFENESIN-DM 100-10 MG/5ML PO SYRP: 15.0000 mL | ORAL_SOLUTION | ORAL | Status: DC | PRN

## 2018-03-27 MED ORDER — CEFAZOLIN SODIUM-DEXTROSE 2-4 GM/100ML-% IV SOLN
2.0000 g | INTRAVENOUS | Status: AC
  Administered 2018-03-27: 2 g via INTRAVENOUS

## 2018-03-27 MED ORDER — METOPROLOL TARTRATE 12.5 MG HALF TABLET
12.5000 mg | ORAL_TABLET | Freq: Two times a day (BID) | ORAL | Status: DC
  Administered 2018-03-27 – 2018-03-31 (×5): 12.5 mg via ORAL
  Filled 2018-03-27 (×7): qty 1

## 2018-03-27 MED ORDER — NEOSTIGMINE METHYLSULFATE 10 MG/10ML IV SOLN
INTRAVENOUS | Status: DC | PRN
  Administered 2018-03-27: 2 mg via INTRAVENOUS

## 2018-03-27 MED ORDER — NITROGLYCERIN FOR UTERINE RELAXATION OPTIME 200MCG/ML
INTRAVENOUS | Status: DC | PRN
  Administered 2018-03-27: 200 ug via INTRAVENOUS

## 2018-03-27 MED ORDER — MIDAZOLAM HCL 5 MG/5ML IJ SOLN
INTRAMUSCULAR | Status: DC | PRN
  Administered 2018-03-27 (×2): 1 mg via INTRAVENOUS

## 2018-03-27 MED ORDER — FENTANYL CITRATE (PF) 100 MCG/2ML IJ SOLN: 25.0000 ug | INTRAMUSCULAR | Status: DC | PRN

## 2018-03-27 MED ORDER — OXYCODONE HCL 5 MG PO TABS: 5.0000 mg | ORAL_TABLET | Freq: Once | ORAL | Status: DC | PRN

## 2018-03-27 MED ORDER — MIDAZOLAM HCL 5 MG/5ML IJ SOLN
INTRAMUSCULAR | Status: DC | PRN
  Administered 2018-03-27: 1 mg via INTRAVENOUS

## 2018-03-27 MED ORDER — SODIUM CHLORIDE 0.9 % IV SOLN
INTRAVENOUS | Status: DC | PRN
  Administered 2018-03-27: 09:00:00

## 2018-03-27 MED ORDER — HYDROMORPHONE HCL 2 MG/ML IJ SOLN
INTRAMUSCULAR | Status: AC
  Filled 2018-03-27: qty 1

## 2018-03-27 MED ORDER — INSULIN LISPRO 100 UNIT/ML (KWIKPEN): 10.0000 [IU] | PEN_INJECTOR | SUBCUTANEOUS | Status: DC

## 2018-03-27 MED ORDER — SUGAMMADEX SODIUM 200 MG/2ML IV SOLN
INTRAVENOUS | Status: DC | PRN
  Administered 2018-03-27: 300 mg via INTRAVENOUS

## 2018-03-27 MED ORDER — ACETAMINOPHEN 325 MG PO TABS: 325.0000 mg | ORAL_TABLET | ORAL | Status: DC | PRN

## 2018-03-27 MED ORDER — DOXYCYCLINE HYCLATE 100 MG PO TABS
100.0000 mg | ORAL_TABLET | Freq: Two times a day (BID) | ORAL | Status: DC
  Administered 2018-03-27 – 2018-03-31 (×8): 100 mg via ORAL
  Filled 2018-03-27 (×8): qty 1

## 2018-03-27 MED ORDER — 0.9 % SODIUM CHLORIDE (POUR BTL) OPTIME
TOPICAL | Status: DC | PRN
  Administered 2018-03-27: 2000 mL

## 2018-03-27 MED ORDER — OXYCODONE HCL 5 MG PO TABS
ORAL_TABLET | ORAL | Status: AC
  Administered 2018-03-27: 5 mg via ORAL
  Filled 2018-03-27: qty 1

## 2018-03-27 MED ORDER — LACTATED RINGERS IV SOLN
INTRAVENOUS | Status: DC | PRN
  Administered 2018-03-27: 14:00:00 via INTRAVENOUS

## 2018-03-27 MED ORDER — SODIUM CHLORIDE 0.9 % IV SOLN
INTRAVENOUS | Status: AC
  Filled 2018-03-27: qty 1.2

## 2018-03-27 MED ORDER — CISATRACURIUM BESYLATE (PF) 10 MG/5ML IV SOLN
INTRAVENOUS | Status: DC | PRN
  Administered 2018-03-27: 10 mg via INTRAVENOUS

## 2018-03-27 MED ORDER — OXYCODONE HCL 5 MG/5ML PO SOLN: 5.0000 mg | Freq: Once | ORAL | Status: AC | PRN

## 2018-03-27 MED ORDER — METOCLOPRAMIDE HCL 5 MG/ML IJ SOLN
INTRAMUSCULAR | Status: DC | PRN
  Administered 2018-03-27: 10 mg via INTRAVENOUS

## 2018-03-27 MED ORDER — HEPARIN SODIUM (PORCINE) 1000 UNIT/ML IJ SOLN
INTRAMUSCULAR | Status: DC | PRN
  Administered 2018-03-27: 7000 [IU] via INTRAVENOUS
  Administered 2018-03-27 (×2): 2000 [IU] via INTRAVENOUS

## 2018-03-27 MED ORDER — ROCURONIUM BROMIDE 50 MG/5ML IV SOLN
INTRAVENOUS | Status: AC
  Filled 2018-03-27: qty 3

## 2018-03-27 MED ORDER — CISATRACURIUM BESYLATE 20 MG/10ML IV SOLN
INTRAVENOUS | Status: AC
  Filled 2018-03-27: qty 10

## 2018-03-27 MED ORDER — ONDANSETRON 4 MG PO TBDP
4.0000 mg | ORAL_TABLET | Freq: Three times a day (TID) | ORAL | Status: DC | PRN
Start: 2018-03-27 — End: 2018-03-31
  Filled 2018-03-27: qty 1

## 2018-03-27 MED ORDER — HEPARIN (PORCINE) IN NACL 100-0.45 UNIT/ML-% IJ SOLN
1450.0000 [IU]/h | INTRAMUSCULAR | Status: AC
  Administered 2018-03-27: 800 [IU]/h via INTRAVENOUS
  Administered 2018-03-28: 1300 [IU]/h via INTRAVENOUS
  Filled 2018-03-27 (×2): qty 250

## 2018-03-27 MED ORDER — PANTOPRAZOLE SODIUM 40 MG PO TBEC
40.0000 mg | DELAYED_RELEASE_TABLET | Freq: Every day | ORAL | Status: DC
  Administered 2018-03-28 – 2018-03-31 (×4): 40 mg via ORAL
  Filled 2018-03-27 (×4): qty 1

## 2018-03-27 MED ORDER — SODIUM CHLORIDE 0.9 % IV BOLUS
1000.0000 mL | Freq: Once | INTRAVENOUS | Status: AC
  Administered 2018-03-28: 1000 mL via INTRAVENOUS

## 2018-03-27 MED ORDER — ESMOLOL HCL 100 MG/10ML IV SOLN
INTRAVENOUS | Status: AC
  Filled 2018-03-27: qty 20

## 2018-03-27 MED ORDER — CEFAZOLIN SODIUM-DEXTROSE 2-4 GM/100ML-% IV SOLN
INTRAVENOUS | Status: AC
  Filled 2018-03-27: qty 100

## 2018-03-27 MED ORDER — HYDROCODONE-ACETAMINOPHEN 10-325 MG PO TABS
1.0000 | ORAL_TABLET | Freq: Two times a day (BID) | ORAL | Status: DC | PRN
  Administered 2018-03-27 – 2018-03-31 (×6): 1 via ORAL
  Filled 2018-03-27 (×6): qty 1

## 2018-03-27 MED ORDER — HEPARIN SODIUM (PORCINE) 5000 UNIT/ML IJ SOLN
INTRAMUSCULAR | Status: AC
  Filled 2018-03-27: qty 1.2

## 2018-03-27 MED ORDER — ONDANSETRON HCL 4 MG/2ML IJ SOLN
INTRAMUSCULAR | Status: DC | PRN
  Administered 2018-03-27 (×2): 4 mg via INTRAVENOUS

## 2018-03-27 SURGICAL SUPPLY — 68 items
ADH SKN CLS APL DERMABOND .7 (GAUZE/BANDAGES/DRESSINGS) ×2
BANDAGE ESMARK 6X9 LF (GAUZE/BANDAGES/DRESSINGS) IMPLANT
BNDG CMPR 9X6 STRL LF SNTH (GAUZE/BANDAGES/DRESSINGS) ×2
BNDG ESMARK 6X9 LF (GAUZE/BANDAGES/DRESSINGS) ×4
CANISTER PREVENA PLUS 150 (CANNISTER) ×2 IMPLANT
CANISTER SUCT 3000ML PPV (MISCELLANEOUS) ×4 IMPLANT
CANNULA VESSEL 3MM 2 BLNT TIP (CANNULA) ×4 IMPLANT
CLIP VESOCCLUDE MED 24/CT (CLIP) ×4 IMPLANT
CLIP VESOCCLUDE SM WIDE 24/CT (CLIP) ×4 IMPLANT
COVER SURGICAL LIGHT HANDLE (MISCELLANEOUS) ×2 IMPLANT
CUFF TOURNIQUET SINGLE 24IN (TOURNIQUET CUFF) ×2 IMPLANT
CUFF TOURNIQUET SINGLE 34IN LL (TOURNIQUET CUFF) IMPLANT
CUFF TOURNIQUET SINGLE 44IN (TOURNIQUET CUFF) IMPLANT
DERMABOND ADVANCED (GAUZE/BANDAGES/DRESSINGS) ×2
DERMABOND ADVANCED .7 DNX12 (GAUZE/BANDAGES/DRESSINGS) ×2 IMPLANT
DRAIN CHANNEL 15F RND FF W/TCR (WOUND CARE) IMPLANT
DRAPE HALF SHEET 40X57 (DRAPES) IMPLANT
DRAPE X-RAY CASS 24X20 (DRAPES) IMPLANT
DRESSING PREVENA PLUS CUSTOM (GAUZE/BANDAGES/DRESSINGS) IMPLANT
DRSG PREVENA PLUS CUSTOM (GAUZE/BANDAGES/DRESSINGS) ×4
ELECT REM PT RETURN 9FT ADLT (ELECTROSURGICAL) ×4
ELECTRODE REM PT RTRN 9FT ADLT (ELECTROSURGICAL) ×2 IMPLANT
EVACUATOR SILICONE 100CC (DRAIN) IMPLANT
GLOVE BIOGEL PI IND STRL 7.5 (GLOVE) ×2 IMPLANT
GLOVE BIOGEL PI INDICATOR 7.5 (GLOVE) ×4
GLOVE ECLIPSE 6.5 STRL STRAW (GLOVE) ×2 IMPLANT
GLOVE ECLIPSE 7.5 STRL STRAW (GLOVE) ×2 IMPLANT
GLOVE SS BIOGEL STRL SZ 7.5 (GLOVE) IMPLANT
GLOVE SUPERSENSE BIOGEL SZ 7.5 (GLOVE) ×2
GLOVE SURG SS PI 7.0 STRL IVOR (GLOVE) ×2 IMPLANT
GLOVE SURG SS PI 7.5 STRL IVOR (GLOVE) ×6 IMPLANT
GOWN STRL REUS W/ TWL LRG LVL3 (GOWN DISPOSABLE) ×4 IMPLANT
GOWN STRL REUS W/ TWL XL LVL3 (GOWN DISPOSABLE) ×2 IMPLANT
GOWN STRL REUS W/TWL LRG LVL3 (GOWN DISPOSABLE) ×12
GOWN STRL REUS W/TWL XL LVL3 (GOWN DISPOSABLE) ×4
GRAFT VASC ANGIOGRAFT 61-70 (Tissue) IMPLANT
HEMOSTAT SNOW SURGICEL 2X4 (HEMOSTASIS) ×2 IMPLANT
KIT BASIN OR (CUSTOM PROCEDURE TRAY) ×4 IMPLANT
KIT DRSG PREVENA PLUS 7DAY 125 (MISCELLANEOUS) ×2 IMPLANT
KIT TURNOVER KIT B (KITS) ×4 IMPLANT
MARKER GRAFT CORONARY BYPASS (MISCELLANEOUS) IMPLANT
NS IRRIG 1000ML POUR BTL (IV SOLUTION) ×8 IMPLANT
PACK PERIPHERAL VASCULAR (CUSTOM PROCEDURE TRAY) ×4 IMPLANT
PAD ARMBOARD 7.5X6 YLW CONV (MISCELLANEOUS) ×8 IMPLANT
SET COLLECT BLD 21X3/4 12 (NEEDLE) IMPLANT
STOPCOCK 4 WAY LG BORE MALE ST (IV SETS) IMPLANT
SUT ETHILON 3 0 PS 1 (SUTURE) IMPLANT
SUT PROLENE 5 0 C 1 24 (SUTURE) ×4 IMPLANT
SUT PROLENE 6 0 BV (SUTURE) ×12 IMPLANT
SUT PROLENE 7 0 BV 1 (SUTURE) IMPLANT
SUT SILK 2 0 PERMA HAND 18 BK (SUTURE) ×2 IMPLANT
SUT SILK 2 0 SH (SUTURE) ×4 IMPLANT
SUT SILK 3 0 (SUTURE)
SUT SILK 3-0 18XBRD TIE 12 (SUTURE) IMPLANT
SUT VIC AB 2-0 CT1 27 (SUTURE) ×8
SUT VIC AB 2-0 CT1 TAPERPNT 27 (SUTURE) ×4 IMPLANT
SUT VIC AB 3-0 SH 27 (SUTURE) ×12
SUT VIC AB 3-0 SH 27X BRD (SUTURE) ×4 IMPLANT
SUT VIC AB 4-0 PS2 18 (SUTURE) ×2 IMPLANT
SUT VICRYL 4-0 PS2 18IN ABS (SUTURE) ×8 IMPLANT
TAPE UMBILICAL COTTON 1/8X30 (MISCELLANEOUS) ×2 IMPLANT
TOWEL GREEN STERILE (TOWEL DISPOSABLE) ×4 IMPLANT
TRAY FOLEY MTR SLVR 16FR STAT (SET/KITS/TRAYS/PACK) ×4 IMPLANT
TUBING EXTENTION W/L.L. (IV SETS) IMPLANT
UNDERPAD 30X30 (UNDERPADS AND DIAPERS) ×4 IMPLANT
VEIN SAPHENOUS CRYO  61-70CM (Tissue) ×2 IMPLANT
VEIN SAPHENOUS CRYO 61-70CM (Tissue) ×2 IMPLANT
WATER STERILE IRR 1000ML POUR (IV SOLUTION) ×4 IMPLANT

## 2018-03-27 SURGICAL SUPPLY — 70 items
ADH SKN CLS APL DERMABOND .7 (GAUZE/BANDAGES/DRESSINGS) ×1
BANDAGE ESMARK 6X9 LF (GAUZE/BANDAGES/DRESSINGS) IMPLANT
BNDG CMPR 9X6 STRL LF SNTH (GAUZE/BANDAGES/DRESSINGS)
BNDG ESMARK 6X9 LF (GAUZE/BANDAGES/DRESSINGS)
CANISTER SUCT 3000ML PPV (MISCELLANEOUS) ×3 IMPLANT
CANNULA VESSEL 3MM 2 BLNT TIP (CANNULA) ×3 IMPLANT
CATH EMB 3FR 40CM (CATHETERS) ×2 IMPLANT
CATH EMB 4FR 80CM (CATHETERS) ×2 IMPLANT
CLIP TI WIDE RED SMALL 6 (CLIP) ×2 IMPLANT
CLIP VESOCCLUDE MED 24/CT (CLIP) ×3 IMPLANT
CLIP VESOCCLUDE MED 6/CT (CLIP) ×2 IMPLANT
CLIP VESOCCLUDE SM WIDE 24/CT (CLIP) ×3 IMPLANT
CUFF TOURNIQUET SINGLE 24IN (TOURNIQUET CUFF) IMPLANT
CUFF TOURNIQUET SINGLE 34IN LL (TOURNIQUET CUFF) IMPLANT
CUFF TOURNIQUET SINGLE 44IN (TOURNIQUET CUFF) IMPLANT
DERMABOND ADVANCED (GAUZE/BANDAGES/DRESSINGS) ×2
DERMABOND ADVANCED .7 DNX12 (GAUZE/BANDAGES/DRESSINGS) ×1 IMPLANT
DRAIN CHANNEL 15F RND FF W/TCR (WOUND CARE) IMPLANT
DRAPE HALF SHEET 40X57 (DRAPES) IMPLANT
DRAPE X-RAY CASS 24X20 (DRAPES) IMPLANT
DRESSING PEEL AND PLC PRVNA 13 (GAUZE/BANDAGES/DRESSINGS) IMPLANT
DRSG PEEL AND PLACE PREVENA 13 (GAUZE/BANDAGES/DRESSINGS) ×3
DRSG TEGADERM 2-3/8X2-3/4 SM (GAUZE/BANDAGES/DRESSINGS) ×2 IMPLANT
ELECT REM PT RETURN 9FT ADLT (ELECTROSURGICAL) ×3
ELECTRODE REM PT RTRN 9FT ADLT (ELECTROSURGICAL) ×1 IMPLANT
EVACUATOR SILICONE 100CC (DRAIN) IMPLANT
GLOVE BIOGEL PI IND STRL 6.5 (GLOVE) IMPLANT
GLOVE BIOGEL PI IND STRL 7.5 (GLOVE) ×1 IMPLANT
GLOVE BIOGEL PI INDICATOR 6.5 (GLOVE) ×6
GLOVE BIOGEL PI INDICATOR 7.5 (GLOVE) ×2
GLOVE SURG SS PI 6.5 STRL IVOR (GLOVE) ×4 IMPLANT
GLOVE SURG SS PI 7.5 STRL IVOR (GLOVE) ×3 IMPLANT
GOWN STRL REUS W/ TWL LRG LVL3 (GOWN DISPOSABLE) ×2 IMPLANT
GOWN STRL REUS W/ TWL XL LVL3 (GOWN DISPOSABLE) ×1 IMPLANT
GOWN STRL REUS W/TWL LRG LVL3 (GOWN DISPOSABLE) ×6
GOWN STRL REUS W/TWL XL LVL3 (GOWN DISPOSABLE) ×3
HEMOSTAT SNOW SURGICEL 2X4 (HEMOSTASIS) ×2 IMPLANT
INSERT FOGARTY SM (MISCELLANEOUS) IMPLANT
KIT BASIN OR (CUSTOM PROCEDURE TRAY) ×3 IMPLANT
KIT TURNOVER KIT B (KITS) ×3 IMPLANT
MARKER GRAFT CORONARY BYPASS (MISCELLANEOUS) IMPLANT
NDL 18GX1X1/2 (RX/OR ONLY) (NEEDLE) IMPLANT
NEEDLE 18GX1X1/2 (RX/OR ONLY) (NEEDLE) ×3 IMPLANT
NS IRRIG 1000ML POUR BTL (IV SOLUTION) ×6 IMPLANT
PACK PERIPHERAL VASCULAR (CUSTOM PROCEDURE TRAY) ×3 IMPLANT
PAD ARMBOARD 7.5X6 YLW CONV (MISCELLANEOUS) ×6 IMPLANT
SET COLLECT BLD 21X3/4 12 (NEEDLE) IMPLANT
SET COLLECT BLD 21X3/4 12 PB (MISCELLANEOUS) ×2 IMPLANT
STOPCOCK 4 WAY LG BORE MALE ST (IV SETS) ×2 IMPLANT
SUT ETHILON 3 0 PS 1 (SUTURE) IMPLANT
SUT GORETEX 6.0 TT13 (SUTURE) IMPLANT
SUT GORETEX 6.0 TT9 (SUTURE) IMPLANT
SUT PROLENE 5 0 C 1 24 (SUTURE) ×3 IMPLANT
SUT PROLENE 6 0 BV (SUTURE) ×9 IMPLANT
SUT PROLENE 7 0 BV 1 (SUTURE) IMPLANT
SUT SILK 2 0 SH (SUTURE) ×3 IMPLANT
SUT SILK 3 0 (SUTURE)
SUT SILK 3-0 18XBRD TIE 12 (SUTURE) IMPLANT
SUT VIC AB 2-0 CT1 27 (SUTURE) ×9
SUT VIC AB 2-0 CT1 TAPERPNT 27 (SUTURE) ×2 IMPLANT
SUT VIC AB 3-0 SH 27 (SUTURE) ×9
SUT VIC AB 3-0 SH 27X BRD (SUTURE) ×2 IMPLANT
SUT VIC AB 4-0 PS2 18 (SUTURE) ×2 IMPLANT
SUT VICRYL 4-0 PS2 18IN ABS (SUTURE) ×6 IMPLANT
SYR 10ML LL (SYRINGE) ×4 IMPLANT
TOWEL GREEN STERILE (TOWEL DISPOSABLE) ×3 IMPLANT
TRAY FOLEY MTR SLVR 16FR STAT (SET/KITS/TRAYS/PACK) ×3 IMPLANT
TUBING EXTENTION W/L.L. (IV SETS) ×2 IMPLANT
UNDERPAD 30X30 (UNDERPADS AND DIAPERS) ×3 IMPLANT
WATER STERILE IRR 1000ML POUR (IV SOLUTION) ×3 IMPLANT

## 2018-03-27 NOTE — Progress Notes (Signed)
ANTICOAGULATION CONSULT NOTE - Initial Consult  Pharmacy Consult for Heparin Indication: atrial fibrillation  No Known Allergies  Patient Measurements:   Heparin Dosing Weight: 71.7  Vital Signs: Temp: 97.7 F (36.5 C) (05/10 1330) Temp Source: Oral (05/10 0611) BP: 108/72 (05/10 1334) Pulse Rate: 86 (05/10 1346)  Labs: Recent Labs    03/27/18 0933 03/27/18 1243 03/27/18 1310  HGB 14.3 8.7*  --   HCT 42.0 28.3*  --   PLT  --  326  --   APTT  --   --  28    Estimated Creatinine Clearance: 75.4 mL/min (by C-G formula based on SCr of 0.87 mg/dL).   Medical History: Past Medical History:  Diagnosis Date  . Arthritis   . Asthma   . Chronic back pain   . Depression   . Diabetes mellitus   . Diabetic neuropathy ([place])   . Family history of adverse reaction to anesthesia    mother had difficlty waking   . Femoral-popliteal bypass graft occlusion, left ([place]) 12/02/2017  . GERD (gastroesophageal reflux disease)   . Hyperlipidemia   . Osteomyelitis of right fibula ([place]) 03/05/2017  . PAD (peripheral artery disease) ([place])   . Paroxysmal atrial fibrillation with rapid ventricular response ([place]) 12/02/2017  . Ulcer    Foot    Assessment: Heparin initiated post-op at 800 units/hr.  Will hold at this dose until tomorrow and then begin titration to achieve therapeutic.  Goal of Therapy:  Heparin level 0.3-0.7 units/ml Monitor platelets by anticoagulation protocol: Yes   Plan:  Heparin intitiated at 800 units/hr.  Will begin titration in the am. Monitor CBC and signs and symptoms of bleeding.   Corinda Gubler 03/27/2018,4:04 PM

## 2018-03-27 NOTE — Anesthesia Preprocedure Evaluation (Addendum)
Anesthesia Evaluation  Patient identified by MRN, date of birth, ID band Patient awake    Reviewed: Allergy & Precautions, NPO status , Patient's Chart, lab work & pertinent test results  Airway Mallampati: II  TM Distance: >3 FB Neck ROM: Full    Dental  (+) Teeth Intact, Dental Advisory Given   Pulmonary former smoker,    breath sounds clear to auscultation       Cardiovascular  Rhythm:Irregular Rate:Normal     Neuro/Psych    GI/Hepatic   Endo/Other  diabetes  Renal/GU      Musculoskeletal   Abdominal   Peds  Hematology   Anesthesia Other Findings   Reproductive/Obstetrics                             Anesthesia Physical Anesthesia Plan  ASA: III  Anesthesia Plan: General   Post-op Pain Management:  Regional for Post-op pain   Induction: Intravenous  PONV Risk Score and Plan: Ondansetron  Airway Management Planned:   Additional Equipment:   Intra-op Plan:   Post-operative Plan: Extubation in OR  Informed Consent: I have reviewed the patients History and Physical, chart, labs and discussed the procedure including the risks, benefits and alternatives for the proposed anesthesia with the patient or authorized representative who has indicated his/her understanding and acceptance.   Dental advisory given  Plan Discussed with: CRNA and Anesthesiologist  Anesthesia Plan Comments:         Anesthesia Quick Evaluation

## 2018-03-27 NOTE — Progress Notes (Signed)
A-line pressure 81/47 at 1715. A-line pressure at 1724 92/49. One time 500cc bolus initiated per order.   Fritz Pickerel, RN

## 2018-03-27 NOTE — Progress Notes (Addendum)
Follow up RRT:  Pt resting in bed, SBP:120s-130s.  Dr. Donzetta Matters to bedside prior to RRT followup, no new interventions ordered.  No concerns from bedside RN.  Please call RRT if further assistance needed.

## 2018-03-27 NOTE — Anesthesia Procedure Notes (Signed)
Arterial Line Insertion Start/End5/08/2018 8:40 AM, 03/27/2018 8:45 AM Performed by: Roberts Gaudy, MD, anesthesiologist  Patient location: OR. Preanesthetic checklist: patient identified, IV checked, site marked, risks and benefits discussed, surgical consent, monitors and equipment checked, pre-op evaluation and timeout performed Right, brachial was placed Catheter size: 20 G  Attempts: 1 Procedure performed using ultrasound guided technique. Ultrasound Notes:anatomy identified Following insertion, Biopatch and dressing applied. Patient tolerated the procedure well with no immediate complications.

## 2018-03-27 NOTE — Progress Notes (Signed)
Patient in PACU without doppler signals to foot.  Discussed going back to the OR for exploration  [name]

## 2018-03-27 NOTE — Transfer of Care (Signed)
Immediate Anesthesia Transfer of Care Note  Patient: [name]  Procedure(s) Performed: THROMBECTOMY OF LEFT FEMORAL TIBIAL BYPASS (Left Leg Lower) INTRA OPERATIVE ARTERIOGRAM TIMES TWO. (Left Leg Lower)  Patient Location: PACU  Anesthesia Type:General  Level of Consciousness: awake, alert , oriented and patient cooperative  Airway & Oxygen Therapy: Patient Spontanous Breathing and Patient connected to face mask oxygen  Post-op Assessment: Report given to RN and Post -op Vital signs reviewed and stable  Post vital signs: Reviewed and stable  Last Vitals:  Vitals Value Taken Time  BP    Temp    Pulse 88 03/27/2018  4:07 PM  Resp 15 03/27/2018  4:07 PM  SpO2 100 % 03/27/2018  4:07 PM  Vitals shown include unvalidated device data.  Last Pain:  Vitals:   03/27/18 1330  TempSrc:   PainSc: 0-No pain      Patients Stated Pain Goal: 3 (29/93/71 6967)  Complications: No apparent anesthesia complications

## 2018-03-27 NOTE — H&P (Signed)
   Patient name: [name] MRN: [id number] DOB: [birth year] Sex: female  REASON FOR VISIT:     pre-op  HISTORY OF PRESENT ILLNESS:   [name] is a 52 y.o. female who is s/p ABF and bilateral fem pop BPG.  She still has a wound on her left leg.  Her left fem pop was confirmed occluded on angiogram.  SHe is here today for redo bypass  CURRENT MEDICATIONS:    Current Facility-Administered Medications  Medication Dose Route Frequency Provider Last Rate Last Dose  . 0.9 %  sodium chloride infusion   Intravenous Continuous Serafina Mitchell, MD      . ceFAZolin (ANCEF) 2-4 GM/100ML-% IVPB           . ceFAZolin (ANCEF) IVPB 2g/100 mL premix  2 g Intravenous 30 min Pre-Op Serafina Mitchell, MD        REVIEW OF SYSTEMS:   [X]  denotes positive finding, [ ]  denotes negative finding Cardiac  Comments:  Chest pain or chest pressure:    Shortness of breath upon exertion:    Short of breath when lying flat:    Irregular heart rhythm:    Constitutional    Fever or chills:      PHYSICAL EXAM:   Vitals:   03/27/18 0611  BP: (!) 146/69  Pulse: 84  Resp: 18  Temp: 98.1 F (36.7 C)  TempSrc: Oral  SpO2: 100%    GENERAL: The patient is a well-nourished female, in no acute distress. The vital signs are documented above. CARDIOVASCULAR: There is a regular rate and rhythm. PULMONARY: Non-labored respirations Dry ulcers to left foot  STUDIES:   none   MEDICAL ISSUES:   For redo left fem pop bypass today.  Risks and benefits discussed.  All questions answered.  [name], MD [facility] [phone] Pager [phone]

## 2018-03-27 NOTE — Anesthesia Procedure Notes (Signed)
Procedure Name: Intubation Date/Time: 03/27/2018 2:09 PM Performed by: Cleda Daub, CRNA Pre-anesthesia Checklist: Patient identified, Emergency Drugs available, Suction available and Patient being monitored Patient Re-evaluated:Patient Re-evaluated prior to induction Oxygen Delivery Method: Circle system utilized Preoxygenation: Pre-oxygenation with 100% oxygen Induction Type: IV induction Ventilation: Mask ventilation without difficulty and Mask ventilation throughout procedure Laryngoscope Size: Mac and 3 Grade View: Grade I Tube type: Oral Tube size: 7.0 mm Number of attempts: 1 Airway Equipment and Method: Stylet Placement Confirmation: ETT inserted through vocal cords under direct vision,  positive ETCO2 and breath sounds checked- equal and bilateral Secured at: 20 cm Tube secured with: Tape Dental Injury: Teeth and Oropharynx as per pre-operative assessment

## 2018-03-27 NOTE — Anesthesia Procedure Notes (Signed)
Procedure Name: Intubation Date/Time: 03/27/2018 8:01 AM Performed by: Cleda Daub, CRNA Pre-anesthesia Checklist: Patient identified, Emergency Drugs available, Suction available and Patient being monitored Patient Re-evaluated:Patient Re-evaluated prior to induction Oxygen Delivery Method: Circle system utilized Preoxygenation: Pre-oxygenation with 100% oxygen Induction Type: IV induction Ventilation: Mask ventilation without difficulty and Mask ventilation throughout procedure Laryngoscope Size: Mac and 3 Grade View: Grade I Tube type: Oral Number of attempts: 1 Airway Equipment and Method: Stylet Placement Confirmation: ETT inserted through vocal cords under direct vision,  positive ETCO2 and breath sounds checked- equal and bilateral Secured at: 20 cm Tube secured with: Tape Dental Injury: Teeth and Oropharynx as per pre-operative assessment  Comments: Poor dentition.

## 2018-03-27 NOTE — Op Note (Signed)
Patient name: [name] MRN: [id number] DOB: 02/09/1966 Sex: female  03/27/2018 Pre-operative Diagnosis: occluded left femoral to  Posterior tibial bypass graft Post-operative diagnosis:  Same Surgeon:  [name] Assistants:  Arlee Muslim Procedure:   #1: Thrombectomy of left femoral posterior tibial bypass graft   #2: Intraoperative arteriogram   #3: Intra-arterial administration of nitroglycerin    #4: Placement of Praveena incisional wound VAC Anesthesia: General Blood Loss:  400 cc Specimens:   none   Findings: No obvious reason for bypass graft occlusion.  Angiogram shows severe spasm within the posterior tibial artery.  The patient had an excellent Doppler signal in the posterior tibial artery at the ankle after the procedure.  Indications: Earlier today the patient underwent a left femoral to posterior tibial artery bypass graft with cryopreserved saphenous vein.  She was found to be without a Doppler signal in the PACU.  She was hypotensive and placed on Neo-Synephrine in the PACU.  I also had difficulty getting her therapeutic on heparin during the procedure.  I discussed with the patient and her husband that we need to go back to the operating room for revascularization.  Procedure:  The patient was identified in the holding area and taken to [place] [facility]  The patient was then placed supine on the table. general anesthesia was administered.  The patient was prepped and draped in the usual sterile fashion.  A time out was called and antibiotics were administered.  The previous medial calf below-knee incision was opened.  The sutures were removed and a retractor was placed.  The patient was fully heparinized.  We checked ACT levels and had to re-bolus her with heparin.  The bypass graft was occluded.  I made a transverse venotomy in the hood of the bypass graft.  I used a #4 Fogarty catheter to perform thrombectomy of the bypass graft.  Multiple passes were made proximally  until all thrombus was evacuated and excellent inflow was obtained.  I then passed a #3 Fogarty catheter distally and evacuated a small amount of thrombus.  There was good backbleeding.  Initially I was able to advance a 1.5 dilator proximally and distally.  One final attempt at passing a #2 dilator was unsuccessful as it would go about 2 cm distal to the anastomosis and then stop.  I then elected to close the venotomy was then closed transversely with 6-0.  Initially there was a weaker signal within the first 2 cm of the posterior tibial   artery, and a multiphasic signal at the ankle.  After approximately 15 seconds I reevaluated the artery distal to the anastomosis and what was a week signal had become normal.  I elected to shoot an arteriogram.  A butterfly needle was inserted into the graft.  The first injection picture was poorly timed and most of the contrast was in the vein and so a second picture was taken.  This time, severe spasm was seen in the posterior tibial artery.  I injected 900 mcg of nitroglycerin through the butterfly needle.  I removed the butterfly needle and closed the site with a 6-0 Prolene.  The patient continued to have a palpable pulse within the bypass graft, and a multiphasic Doppler signal at the ankle.  No further intervention was felt to be appropriate.  I did not reverse the heparin.  The incision was closed by reapproximating the fascia with 2 layers of 2-0 Vicryl followed by 4-0 Vicryl and skin and Dermabond.  The Praveena wound VAC in the groin was replaced.  I rechecked the Doppler signal at the ankle which remained stable.  Patient was then successfully extubated and taken to recovery in stable condition.  There were no immediate complications.   Disposition: To PACU stable   V. [name], M.D. [facility] [place] Office: [phone] Pager:  [phone]

## 2018-03-27 NOTE — Progress Notes (Signed)
Per CRNA report pt's NIBP runs 40 lower than arterial. Left arm runs lower per pt report. Arterial BP dropped into 90, increase IVF, will restart neo gtt, Left foot cool on arrival to PACU, wrapped in warm blankets, able to obtain a flow to R post tib on doppler, no DP, on left since arrival to PACU- calls out to Dr Trula Slade and [name], PA-C (awaiting call back)

## 2018-03-27 NOTE — Progress Notes (Signed)
Call to Dr Linna Caprice at 1239 will administer albumin per orders & draw CBC (additional post op labs drawn per post op orders), stat [facility] ordered. No complaints of pain at this time- pt unable to wiggle toes- per pt she has not been "able to do that for awhile"- pt able to move foot side to side- neo at 65mcg/min- call back from Dr Trula Slade at 1241 - will follow up at bedside - Gwenette Greet to bedside at 1255 to assess pt - able to obtain a popliteal pulse with doppler L foot slightly warmer- remains discolored  (same as preop status). Dr Trula Slade to bedside at 1310 to assess pt- updated on pt's labile BP, pulses- plan is to take pt back to OR. Pt's husband to bedside at 18- pt & husband updated - consent obtained. No other questions at this time.

## 2018-03-27 NOTE — Transfer of Care (Signed)
Immediate Anesthesia Transfer of Care Note  Patient: [name]  Procedure(s) Performed: BYPASS GRAFT FEMORAL-TIBIAL ARTERY LEFT REDO USING CRYOPRESERVED SAPHENOUS VEIN 70cm (Left Leg Upper) APPLICATION OF WOUND VAC LEFT GROIN USING PREVENA PLUS (Left Groin)  Patient Location: PACU  Anesthesia Type:General  Level of Consciousness: awake, alert , oriented and patient cooperative  Airway & Oxygen Therapy: Patient Spontanous Breathing and Patient connected to face mask oxygen  Post-op Assessment: Report given to RN and Post -op Vital signs reviewed and stable  Post vital signs: Reviewed and stable  Last Vitals:  Vitals Value Taken Time  BP    Temp    Pulse 88 03/27/2018 12:15 PM  Resp 13 03/27/2018 12:15 PM  SpO2 100 % 03/27/2018 12:15 PM  Vitals shown include unvalidated device data.  Last Pain:  Vitals:   03/27/18 0611  TempSrc: Oral  PainSc:       Patients Stated Pain Goal: 3 (18/28/83 3744)  Complications: No apparent anesthesia complications

## 2018-03-27 NOTE — Progress Notes (Signed)
Pts BP in 80s, MD paged.Orders noted. Pt c/o pain, told pt her BP needs to come up before I can give her pain medicine. Pt turned to L side, resting. Will continue to monitor.

## 2018-03-27 NOTE — Anesthesia Postprocedure Evaluation (Signed)
Anesthesia Post Note  Patient: [name]  Procedure(s) Performed: THROMBECTOMY OF LEFT FEMORAL TIBIAL BYPASS (Left Leg Lower) INTRA OPERATIVE ARTERIOGRAM TIMES TWO (Left Leg Lower)     Patient location during evaluation: PACU Anesthesia Type: General Level of consciousness: awake Pain management: pain level controlled Vital Signs Assessment: post-procedure vital signs reviewed and stable Respiratory status: spontaneous breathing Cardiovascular status: stable Anesthetic complications: no    Last Vitals:  Vitals:   03/27/18 1757 03/27/18 1758  BP:    Pulse:  91  Resp:  17  Temp: 36.9 C   SpO2:  96%    Last Pain:  Vitals:   03/27/18 1757  TempSrc: Oral  PainSc:                  Aniruddh Ciavarella

## 2018-03-27 NOTE — Progress Notes (Signed)
   Called to PACU patient has had a drop in BP with systolic being as low as 39'Y-72'W.  Neo was started 10 mcg.  Systolic per A line now 979'N.  Doppler signal present left PT at level of the ankle.   I will transfer her ICU instead of Stepdown post op.  Dr. Trula Slade was notified he will see the patient him self.  [name] PA-C

## 2018-03-27 NOTE — Plan of Care (Signed)
  Problem: Clinical Measurements: Goal: Respiratory complications will improve Outcome: Progressing   Problem: Activity: Goal: Risk for activity intolerance will decrease Outcome: Progressing   

## 2018-03-27 NOTE — Progress Notes (Addendum)
Pt received from PACU. Telemetry applied, CCMD notified x2. Only arterial line BP per Dr. Trula Slade as cuff pressure inaccurate. Afebrile. CHG completed. [name] and LLE dorsalis pedal and posterior tibial pulse doppler present.   Fritz Pickerel, RN

## 2018-03-27 NOTE — Progress Notes (Signed)
RN called regarding SBP 86-90 after returning from vascular surgery to LLE. Dr. Donzetta Matters placed new orders for 1L NS bolus, CBC, BMP, Troponin, results pending. Placed on radar list, will report off to night RRT to follow up with patient. SBP 98

## 2018-03-27 NOTE — Progress Notes (Signed)
Pt returned to OR at 1350

## 2018-03-27 NOTE — Anesthesia Procedure Notes (Addendum)
Central Venous Catheter Insertion Performed by: Roberts Gaudy, MD, anesthesiologist Start/End5/08/2018 8:10 AM, 03/27/2018 8:25 AM Patient location: OR. Preanesthetic checklist: patient identified, IV checked, site marked, risks and benefits discussed, surgical consent, monitors and equipment checked, pre-op evaluation and timeout performed Position: supine Hand hygiene performed , maximum sterile barriers used  and Seldinger technique used Catheter size: 7.5 Fr Central line was placed.Double lumen Procedure performed using ultrasound guided technique. Ultrasound Notes:anatomy identified, needle tip was noted to be adjacent to the nerve/plexus identified and no ultrasound evidence of intravascular and/or intraneural injection Attempts: 1 Following insertion, line sutured, dressing applied and Biopatch. Patient tolerated the procedure well with no immediate complications.

## 2018-03-27 NOTE — Op Note (Addendum)
Patient name: [name] MRN: [id number] DOB: June 03, 1966 Sex: female  03/27/2018 Pre-operative Diagnosis: left foot ulcer Post-operative diagnosis:  Same Surgeon:  [name] Assistants:  Laurence Slate Procedure:   #1: Redo exposure of left common femoral artery     #2: Redo exposure left posterior tibial artery   #3: Left femoral to posterior tibial artery bypass graft with cryopreserved vein    #4: Application of Praveena wound VAC Anesthesia: General Blood Loss:  See anesthesia record Specimens:  none  Findings: Posterior tibial artery was small but minimal disease.  The vein graft was plugged into the anterior surface of the left limb of the aortobifemoral graft.  Indications: Patient has undergone bilateral lower extremity femoral-popliteal bypass graft as well as an aortobifemoral bypass graft in order to try and heal the wound on her leg.  She has a occluded left femoral-popliteal bypass graft.  Angiography revealed reconstitution of the posterior tibial artery.  She does not have any vein remaining and therefore she comes in today for femoral-tibial bypass graft as a last ditch effort for limb salvage.  Procedure:  The patient was identified in the holding area and taken to [place] [facility]  The patient was then placed supine on the table. general anesthesia was administered.  The patient was prepped and draped in the usual sterile fashion.  A time out was called and antibiotics were administered.  The patient's previous longitudinal left femoral incision was opened with a 10 blade.  Because used about subtenons tissue.  I identified the left limb of the aortobifemoral graft and dissected this free to the point where I could get a Cooley J clamp to occlude it.  The patient's tissue did not appear very healthy and there was edema fluid present.  Next, I made a incision on the medial lower leg.  Cautery was used about subtenons tissue down to the fascia which was opened sharply.  Again  the patient had significant edema fluid present.  I was able to expose the posterior tibial artery.  This appeared to be a healthy artery however it was small measuring about 1.5 mm.  I then selected a long Gore tunneler and created a tunnel between the 2 incisions.  Because of the murkiness of the edema fluid I elected to use a cryopreserved vein rather than Gore-Tex.  The appropriate protocol for falling and cleaning the vein was followed.  It was a 70 cm saphenous vein from a 54 year old donor with 22-minute warm ischemia time.  Patient was then fully heparinized.  After the heparin circulated, an ACT was checked and heparin was appropriately redosed.  A Cooley J clamp was then placed on the left limb of aortobifemoral graft.  A #11 blade was used to make a graftotomy which was extended with tenotomy scissors.  I then used a #5 punch to open up the arteriotomy further.  The vein was then marked for orientation and flushed with heparin saline.  It was placed in a non-reversed fashion as the vein was basically uniform in diameter.  It was spatulated to fit the size of the graftotomy.  A running anastomosis was created with 6-0 Prolene.  Once the anastomosis was completed the clamp was released.  There was an excellent pulsatile flow through the graft.  The vein was then brought through the previously created tunnel, making sure to maintain proper orientation.  Next a tourniquet was placed on the upper thigh of the leg was exsanguinated with an Esmarch.  Tourniquet was taken 250 mm of pressure.  I then used a #11 blade to make a arteriotomy in the posterior tibial artery which was extended longitudinally with Potts scissors.  The vein was small but essentially disease-free.  I was easily able to pass a #2 dilator.  Next, the vein was cut the appropriate length and spatulated to fit the size of the arteriotomy.  A running anastomosis was created with 6-0 Prolene.  Prior to completion the appropriate flushing  maneuvers were performed and the anastomosis was completed.  There was an excellent pulse within the graft.  Patient also had a graft dependent posterior tibial Doppler signal.  50 mg of protamine was given.  When we reevaluate the groin, the need to be significant bleeding coming from the tunnel tract.  We tried to control this from the femoral incision however I had to make a second incision in order to explore the area where it was bleeding.  Ultimately we found a muscular vein that was bleeding that was easily oversewn with 2-0 silk tie.  At this point the wounds were hemostatic.  The distal incision was closed by reapproximating the fascia with 2-0 Vicryl.  The skin was then closed with 3-0 Vicryl.  The counterincision in the upper thigh was closed with 2 layers of 3-0 Vicryl followed by 4-0 Vicryl.  Dermabond was placed.  The groin incision was closed by reapproximating subcutaneous tissue with multiple layers of 2-0 and 3-0 Vicryl followed by 3-0 Vicryl on the skin.  A Praveena wound VAC was placed.   Disposition: To PACU stable #4: Application of Praveena wound VAC   V. [name], M.D. [facility] [place] Office: [phone] Pager:  [phone]

## 2018-03-27 NOTE — Progress Notes (Signed)
   Patient evaluated at bedside for hypotension.  She is responded to 1-1/2 L bolus at this time.  Labs have been reviewed and there will be no intervention.  Continue IV fluids.  Heparin to start at 800 units/h.  She has a strong posterior tibial signal at this time.  Brandon C. Donzetta Matters, MD [facility] [place] Office: [phone] Pager: [phone]

## 2018-03-28 LAB — BASIC METABOLIC PANEL
Anion gap: 6 (ref 5–15)
BUN: 9 mg/dL (ref 6–20)
CALCIUM: 6 mg/dL — AB (ref 8.9–10.3)
CO2: 26 mmol/L (ref 22–32)
Chloride: 108 mmol/L (ref 101–111)
Creatinine, Ser: 0.9 mg/dL (ref 0.44–1.00)
GFR calc non Af Amer: >60 mL/min (ref >60)
Glucose, Bld: 132 mg/dL — ABNORMAL HIGH (ref 65–99)
Potassium: 5.5 mmol/L — ABNORMAL HIGH (ref 3.5–5.1)
SODIUM: 140 mmol/L (ref 135–145)

## 2018-03-28 LAB — CBC
HCT: 25.5 % — ABNORMAL LOW (ref 36.0–46.0)
Hemoglobin: 8.2 g/dL — ABNORMAL LOW (ref 12.0–15.0)
MCH: 25.2 pg — ABNORMAL LOW (ref 26.0–34.0)
MCHC: 32.2 g/dL (ref 30.0–36.0)
MCV: 78.5 fL (ref 78.0–100.0)
Platelets: 264 10*3/uL (ref 150–400)
RBC: 3.25 MIL/uL — ABNORMAL LOW (ref 3.87–5.11)
RDW: 16.2 % — AB (ref 11.5–15.5)
WBC: 9.7 10*3/uL (ref 4.0–10.5)

## 2018-03-28 LAB — APTT: APTT: 54 s — AB (ref 24–36)

## 2018-03-28 LAB — GLUCOSE, CAPILLARY
GLUCOSE-CAPILLARY: 101 mg/dL — AB (ref 65–99)
Glucose-Capillary: 109 mg/dL — ABNORMAL HIGH (ref 65–99)
Glucose-Capillary: 150 mg/dL — ABNORMAL HIGH (ref 65–99)
Glucose-Capillary: 117 mg/dL — ABNORMAL HIGH (ref 65–99)

## 2018-03-28 LAB — HEPARIN LEVEL (UNFRACTIONATED)
Heparin Unfractionated: <0.1 IU/mL — ABNORMAL LOW (ref 0.30–0.70)
Heparin Unfractionated: <0.1 IU/mL — ABNORMAL LOW (ref 0.30–0.70)

## 2018-03-28 MED ORDER — SODIUM CHLORIDE 0.9 % IV SOLN: Freq: Once | INTRAVENOUS | Status: AC

## 2018-03-28 MED ORDER — SODIUM CHLORIDE 0.9 % IV SOLN
1.0000 g | Freq: Once | INTRAVENOUS | Status: AC
  Administered 2018-03-28: 1 g via INTRAVENOUS
  Filled 2018-03-28: qty 10

## 2018-03-28 NOTE — Progress Notes (Addendum)
  Progress Note    03/28/2018 8:45 AM 1 Day Post-Op  Subjective:  No complaints this morning.  Somnolent during exam   Vitals:   03/28/18 0519 03/28/18 0819  BP:  (!) 112/55  Pulse: 84 88  Resp: 18 15  Temp: 98.6 F (37 C) 99.6 F (37.6 C)  SpO2: 99% 96%   Physical Exam: Lungs:  Non labored Incisions:  L groin with incision vac in place; L thigh incision without palpable hematoma, pain to palpation; L distal leg incision without bleeding or drainage; L PTA by doppler; venous like peroneal by doppler; dry gangrene L foot stable Abdomen:  soft Neurologic: A&O  CBC    Component Value Date/Time   WBC 9.7 03/27/2018 2351   RBC 3.25 (L) 03/27/2018 2351   HGB 8.2 (L) 03/27/2018 2351   HCT 25.5 (L) 03/27/2018 2351   PLT 264 03/27/2018 2351   MCV 78.5 03/27/2018 2351   MCH 25.2 (L) 03/27/2018 2351   MCHC 32.2 03/27/2018 2351   RDW 16.2 (H) 03/27/2018 2351   LYMPHSABS 2.1 02/27/2018 1819   MONOABS 0.5 02/27/2018 1819   EOSABS 0.2 02/27/2018 1819   BASOSABS 0.0 02/27/2018 1819    BMET    Component Value Date/Time   NA 140 03/27/2018 2351   K 5.5 (H) 03/27/2018 2351   CL 108 03/27/2018 2351   CO2 26 03/27/2018 2351   GLUCOSE 132 (H) 03/27/2018 2351   BUN 9 03/27/2018 2351   CREATININE 0.90 03/27/2018 2351   CREATININE 1.24 (H) 08/12/2017 1530   CALCIUM 6.0 (LL) 03/27/2018 2351   GFRNONAA >60 03/27/2018 2351   GFRNONAA 50 (L) 08/12/2017 1530   GFRAA >60 03/27/2018 2351   GFRAA 57 (L) 08/12/2017 1530    INR    Component Value Date/Time   INR 1.15 03/24/2018 1440     Intake/Output Summary (Last 24 hours) at 03/28/2018 0845 Last data filed at 03/28/2018 6789 Gross per 24 hour  Intake 5915 ml  Output 3325 ml  Net 2590 ml     Assessment/Plan:  52 y.o. female is s/p L redo femoral to PTA bypass with cryopreserved vein with subsequent thrombectomy 1 Day Post-Op   OOB today Pharmacy to dose heparin to therapeutic range; holding Xarelto Patent bypass given  dopplerable PTA distally LLE Continue periodic doppler exams Recheck CBC, BMP this morning  Dagoberto Ligas, PA-C [facility] [phone] 03/28/2018 8:45 AM  I have independently interviewed and examined patient and agree with PA assessment and plan above.  She is more alert this morning and eating breakfast.  Strong posterior tibial Doppler in the foot is warm.  Incisions are clean dry and intact.  We will transition to full dose heparin with plan for Xarelto in the coming days or if we cannot get the heparin therapeutic.  Brachial arterial line to be removed.  Blood pressure in the left arm is unreliable.  Out of bed today as tolerates.  Drucilla Cumber C. Donzetta Matters, MD [facility] [place] Office: [phone] Pager: [phone]

## 2018-03-28 NOTE — Progress Notes (Signed)
ANTICOAGULATION CONSULT NOTE  Pharmacy Consult for Heparin Indication: atrial fibrillation  No Known Allergies  Patient Measurements: Height: 5\' 7"  (170.2 cm) IBW/kg (Calculated) : 61.6 Heparin Dosing Weight: 71.7  Vital Signs: Temp: 99.6 F (37.6 C) (05/11 0819) Temp Source: Oral (05/11 0819) BP: 112/55 (05/11 0819) Pulse Rate: 88 (05/11 0819)  Labs: Recent Labs    03/27/18 1243 03/27/18 1310 03/27/18 1446 03/27/18 1945 03/27/18 2351 03/28/18 0833 03/28/18 1703  HGB 8.7*  --  10.2* 8.8* 8.2*  --   --   HCT 28.3*  --  30.0* 27.8* 25.5*  --   --   PLT 326  --   --  304 264  --   --   APTT  --  28  --   --   --  54*  --   HEPARINUNFRC  --   --   --   --   --  <0.10* <0.10*  CREATININE  --   --   --  0.83 0.90  --   --   TROPONINI  --   --   --  <0.03  --   --   --     Estimated Creatinine Clearance: 70.3 mL/min (by C-G formula based on SCr of 0.9 mg/dL).   Medical History: Past Medical History:  Diagnosis Date  . Arthritis   . Asthma   . Chronic back pain   . Depression   . Diabetes mellitus   . Diabetic neuropathy ([place])   . Family history of adverse reaction to anesthesia    mother had difficlty waking   . 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 GERD (gastroesophageal reflux disease)   . Hyperlipidemia   . 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 PAD (peripheral artery disease) ([place])   . Paroxysmal atrial fibrillation with rapid ventricular response ([place]) 12/02/2017  . Ulcer    Foot    Assessment: 8 yof s/p L redo femoral to PTA bypass with cryopreserved vein with subsequent thrombectomy on 5/10, continuing on heparin for afib initiated post-op at 800 units/hr per Vascular. Pharmacy consulted to dose starting 5/11.. Previously on Xarelto for afib, but per med rec, last dose was >17mo ago and is only on aspirin PTA now.  Heparin level this evening remains undetectable (HL<0.1, goal of 0.3-0.7). No issues with the infusion  noted per discussion with the RN - infusion site checked. No bleeding noted. Will increase the rate.  Goal of Therapy:  Heparin level 0.3-0.7 units/ml Monitor platelets by anticoagulation protocol: Yes   Plan:  Increase heparin to 1300 units/hr (no bolus with recent procedure) Will continue to monitor for any signs/symptoms of bleeding and will follow up with heparin level in 6 hours   Thank you for allowing pharmacy to be a part of this patient's care.  Alycia Rossetti, PharmD, BCPS Clinical Pharmacist Pager: [phone] Clinical phone for 03/28/2018 from 7a-3:30p: [phone] If after 3:30p, please call main pharmacy at: [phone] 03/28/2018 6:49 PM

## 2018-03-28 NOTE — Progress Notes (Signed)
ANTICOAGULATION CONSULT NOTE  Pharmacy Consult for Heparin Indication: atrial fibrillation  No Known Allergies  Patient Measurements: Height: 5\' 7"  (170.2 cm) IBW/kg (Calculated) : 61.6 Heparin Dosing Weight: 71.7  Vital Signs: Temp: 99.6 F (37.6 C) (05/11 0819) Temp Source: Oral (05/11 0819) BP: 112/55 (05/11 0819) Pulse Rate: 88 (05/11 0819)  Labs: Recent Labs    03/27/18 1243 03/27/18 1310 03/27/18 1446 03/27/18 1945 03/27/18 2351 03/28/18 0833  HGB 8.7*  --  10.2* 8.8* 8.2*  --   HCT 28.3*  --  30.0* 27.8* 25.5*  --   PLT 326  --   --  304 264  --   APTT  --  28  --   --   --  54*  HEPARINUNFRC  --   --   --   --   --  <0.10*  CREATININE  --   --   --  0.83 0.90  --   TROPONINI  --   --   --  <0.03  --   --     Estimated Creatinine Clearance: 70.3 mL/min (by C-G formula based on SCr of 0.9 mg/dL).   Medical History: Past Medical History:  Diagnosis Date  . Arthritis   . Asthma   . Chronic back pain   . Depression   . Diabetes mellitus   . Diabetic neuropathy ([place])   . Family history of adverse reaction to anesthesia    mother had difficlty waking   . 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 GERD (gastroesophageal reflux disease)   . Hyperlipidemia   . 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 PAD (peripheral artery disease) ([place])   . Paroxysmal atrial fibrillation with rapid ventricular response ([place]) 12/02/2017  . Ulcer    Foot    Assessment: 17 yof s/p L redo femoral to PTA bypass with cryopreserved vein with subsequent thrombectomy on 5/10, continuing on heparin for afib initiated post-op at 800 units/hr per Vascular. Pharmacy consulted to dose starting 5/11. Initial heparin level undetectable this AM. Hg down to 8.2, plt wnl. No bleed or IV line issues reported per discussion with RN. Previously on Xarelto for afib, but per med rec, last dose was >29mo ago and is only on aspirin PTA now.  Goal of Therapy:  Heparin  level 0.3-0.7 units/ml Monitor platelets by anticoagulation protocol: Yes   Plan:  Increase heparin to 1050 units/hr (no bolus with recent procedure) Monitor CBC and signs and symptoms of bleeding  Elicia Lamp, PharmD, BCPS Clinical Pharmacist Clinical phone for 03/28/2018 until 3:30pm: [phone] If after 3:30pm, please call main pharmacy at: [phone] 03/28/2018 10:53 AM

## 2018-03-28 NOTE — Anesthesia Postprocedure Evaluation (Signed)
Anesthesia Post Note  Patient: [name]  Procedure(s) Performed: THROMBECTOMY OF LEFT FEMORAL TIBIAL BYPASS (Left Leg Lower) INTRA OPERATIVE ARTERIOGRAM TIMES TWO (Left Leg Lower)     Anesthesia Post Evaluation  Last Vitals:  Vitals:   03/28/18 0519 03/28/18 0819  BP:  (!) 112/55  Pulse: 84 88  Resp: 18 15  Temp: 37 C 37.6 C  SpO2: 99% 96%    Last Pain:  Vitals:   03/28/18 1145  TempSrc:   PainSc: Asleep                 [name]

## 2018-03-28 NOTE — Progress Notes (Signed)
Patient calcium 6.0. Dr. Donzetta Matters notified and telephone order to give calcium IV and 1liter of fliud for low blood pressure.

## 2018-03-28 NOTE — Progress Notes (Signed)
PT Cancellation Note  Patient Details Name: [name] MRN: [id number] DOB: 1966-08-24   Cancelled Treatment:    Reason Eval/Treat Not Completed: Fatigue/lethargy limiting ability to participate(Pt refused in am and pm states she sat EOB to eat lunch. Confirmed with nurse and pt did come to EOB with little assist per nurse and sit and eat lunch.  Will return tomorrow and reattempt as pt allows.)   Denice Paradise 03/28/2018, 2:50 PM [facility] [phone] (pager)

## 2018-03-28 NOTE — Progress Notes (Signed)
CRITICAL VALUE ALERT  Critical Value:  Calcium 6.0  Date & Time Notied:  05/11/209 0105  Provider Notified: Dr. Donzetta Matters  Orders Received/Actions taken: Calcium IV

## 2018-03-29 ENCOUNTER — Inpatient Hospital Stay (HOSPITAL_COMMUNITY): Payer: Medicare Other

## 2018-03-29 DIAGNOSIS — I739 Peripheral vascular disease, unspecified: Secondary | ICD-10-CM

## 2018-03-29 LAB — GLUCOSE, CAPILLARY
GLUCOSE-CAPILLARY: 67 mg/dL (ref 65–99)
Glucose-Capillary: 79 mg/dL (ref 65–99)
Glucose-Capillary: 99 mg/dL (ref 65–99)
Glucose-Capillary: 131 mg/dL — ABNORMAL HIGH (ref 65–99)

## 2018-03-29 LAB — BASIC METABOLIC PANEL
ANION GAP: 7 (ref 5–15)
BUN: 11 mg/dL (ref 6–20)
CALCIUM: 8 mg/dL — AB (ref 8.9–10.3)
CO2: 28 mmol/L (ref 22–32)
CREATININE: 0.95 mg/dL (ref 0.44–1.00)
Chloride: 101 mmol/L (ref 101–111)
Glucose, Bld: 87 mg/dL (ref 65–99)
Potassium: 4.2 mmol/L (ref 3.5–5.1)
SODIUM: 136 mmol/L (ref 135–145)

## 2018-03-29 LAB — CBC WITH DIFFERENTIAL/PLATELET
BASOS ABS: 0 10*3/uL (ref 0.0–0.1)
BASOS PCT: 0 %
EOS PCT: 2 %
Eosinophils Absolute: 0.2 10*3/uL (ref 0.0–0.7)
HCT: 26.7 % — ABNORMAL LOW (ref 36.0–46.0)
Hemoglobin: 8.2 g/dL — ABNORMAL LOW (ref 12.0–15.0)
LYMPHS ABS: 1.8 10*3/uL (ref 0.7–4.0)
LYMPHS PCT: 19 %
MCH: 24.3 pg — ABNORMAL LOW (ref 26.0–34.0)
MCHC: 30.7 g/dL (ref 30.0–36.0)
MCV: 79 fL (ref 78.0–100.0)
MONO ABS: 0.8 10*3/uL (ref 0.1–1.0)
Monocytes Relative: 8 %
Neutro Abs: 6.8 10*3/uL (ref 1.7–7.7)
Neutrophils Relative %: 71 %
PLATELETS: 251 10*3/uL (ref 150–400)
RBC: 3.38 MIL/uL — AB (ref 3.87–5.11)
RDW: 16.3 % — AB (ref 11.5–15.5)
WBC: 9.6 10*3/uL (ref 4.0–10.5)

## 2018-03-29 LAB — HEPARIN LEVEL (UNFRACTIONATED): HEPARIN UNFRACTIONATED: 0.12 [IU]/mL — AB (ref 0.30–0.70)

## 2018-03-29 LAB — APTT: aPTT: 71 seconds — ABNORMAL HIGH (ref 24–36)

## 2018-03-29 MED ORDER — RIVAROXABAN 20 MG PO TABS
20.0000 mg | ORAL_TABLET | Freq: Every day | ORAL | Status: DC
  Administered 2018-03-29 – 2018-03-30 (×2): 20 mg via ORAL
  Filled 2018-03-29 (×3): qty 1

## 2018-03-29 NOTE — Progress Notes (Addendum)
  Progress Note    03/29/2018 9:50 AM 2 Days Post-Op  Subjective:  No new complaints   Vitals:   03/29/18 0825 03/29/18 0911  BP: (!) 71/48 (!) 87/53  Pulse: 83   Resp: 14   Temp: 98.4 F (36.9 C)   SpO2: 96%    Physical Exam: Cardiac: irregular Lungs:  Non labored Incisions:  L groin with incisional vac; distal incision without drainage Extremities:  L PTA by doppler; pitting edema to mid shin LLE Abdomen:  Soft Neurologic: A&O  CBC    Component Value Date/Time   WBC 9.6 03/29/2018 0358   RBC 3.38 (L) 03/29/2018 0358   HGB 8.2 (L) 03/29/2018 0358   HCT 26.7 (L) 03/29/2018 0358   PLT 251 03/29/2018 0358   MCV 79.0 03/29/2018 0358   MCH 24.3 (L) 03/29/2018 0358   MCHC 30.7 03/29/2018 0358   RDW 16.3 (H) 03/29/2018 0358   LYMPHSABS 1.8 03/29/2018 0358   MONOABS 0.8 03/29/2018 0358   EOSABS 0.2 03/29/2018 0358   BASOSABS 0.0 03/29/2018 0358    BMET    Component Value Date/Time   NA 136 03/29/2018 0358   K 4.2 03/29/2018 0358   CL 101 03/29/2018 0358   CO2 28 03/29/2018 0358   GLUCOSE 87 03/29/2018 0358   BUN 11 03/29/2018 0358   CREATININE 0.95 03/29/2018 0358   CREATININE 1.24 (H) 08/12/2017 1530   CALCIUM 8.0 (L) 03/29/2018 0358   GFRNONAA >60 03/29/2018 0358   GFRNONAA 50 (L) 08/12/2017 1530   GFRAA >60 03/29/2018 0358   GFRAA 57 (L) 08/12/2017 1530    INR    Component Value Date/Time   INR 1.15 03/24/2018 1440     Intake/Output Summary (Last 24 hours) at 03/29/2018 0950 Last data filed at 03/29/2018 0900 Gross per 24 hour  Intake 345.31 ml  Output 800 ml  Net -454.69 ml     Assessment/Plan:  52 y.o. female is s/p L redo femoral to PTA bypass with cryopreserved vein with subsequent thrombectomy    2 Days Post-Op   Transition from heparin back to Xarelto today Strong PTA by doppler; bypass remains patent Continue incisional vac L groin Encouraged OOB PT/OT to eval and treat  DVT prophylaxis:  As above   Dagoberto Ligas,  PA-C [facility] [phone] 03/29/2018 9:50 AM   I have independently interviewed and examined patient and agree with PA assessment and plan above.   Espiridion Supinski C. Donzetta Matters, MD [facility] Office: [phone] Pager: [phone]

## 2018-03-29 NOTE — Progress Notes (Signed)
OT Cancellation Note  Patient Details Name: [name] MRN: [id number] DOB: 04-05-1966   Cancelled Treatment:    Reason Eval/Treat Not Completed: Patient declined, no reason specified. Despite max encouragement, pt reports she is not up for participating in therapy at this time. Will follow up as time allows.  Earlean Shawl A. Ulice Brilliant, M.S., OTR/L Acute Rehab Department: [phone]   03/29/2018, 11:02 AM

## 2018-03-29 NOTE — Anesthesia Preprocedure Evaluation (Addendum)
Anesthesia Evaluation  Patient identified by MRN, date of birth, ID band Patient awake    Reviewed: Allergy & Precautions, NPO status , Patient's Chart, lab work & pertinent test results  Airway Mallampati: II   Neck ROM: Full    Dental   Pulmonary former smoker,    breath sounds clear to auscultation       Cardiovascular  Rhythm:Regular Rate:Normal     Neuro/Psych    GI/Hepatic   Endo/Other  diabetes  Renal/GU      Musculoskeletal   Abdominal   Peds  Hematology   Anesthesia Other Findings   Reproductive/Obstetrics                            Anesthesia Physical Anesthesia Plan  ASA: IV and emergent  Anesthesia Plan: General   Post-op Pain Management:    Induction: Intravenous  PONV Risk Score and Plan:   Airway Management Planned: Oral ETT  Additional Equipment: Arterial line and CVP  Intra-op Plan:   Post-operative Plan: Extubation in OR  Informed Consent: I have reviewed the patients History and Physical, chart, labs and discussed the procedure including the risks, benefits and alternatives for the proposed anesthesia with the patient or authorized representative who has indicated his/her understanding and acceptance.     Plan Discussed with: CRNA and Anesthesiologist  Anesthesia Plan Comments:         Anesthesia Quick Evaluation

## 2018-03-29 NOTE — Progress Notes (Signed)
ANTICOAGULATION CONSULT NOTE  Pharmacy Consult for Heparin Indication: atrial fibrillation  No Known Allergies  Patient Measurements: Height: 5\' 7"  (170.2 cm) Weight: 167 lb 5.3 oz (75.9 kg) IBW/kg (Calculated) : 61.6 Heparin Dosing Weight: 71.7  Vital Signs: Temp: 98.4 F (36.9 C) (05/12 0327) Temp Source: Oral (05/12 0327) BP: 92/48 (05/12 0327) Pulse Rate: 76 (05/12 0327)  Labs: Recent Labs    03/27/18 1310  03/27/18 1945 03/27/18 2351 03/28/18 0833 03/28/18 1703 03/29/18 0358  HGB  --    < > 8.8* 8.2*  --   --  8.2*  HCT  --    < > 27.8* 25.5*  --   --  26.7*  PLT  --   --  304 264  --   --  251  APTT 28  --   --   --  54*  --  71*  HEPARINUNFRC  --   --   --   --  <0.10* <0.10* 0.12*  CREATININE  --   --  0.83 0.90  --   --   --   TROPONINI  --   --  <0.03  --   --   --   --    < > = values in this interval not displayed.    Estimated Creatinine Clearance: 76.8 mL/min (by C-G formula based on SCr of 0.9 mg/dL).   Medical History: Past Medical History:  Diagnosis Date  . Arthritis   . Asthma   . Chronic back pain   . Depression   . Diabetes mellitus   . Diabetic neuropathy ([place])   . Family history of adverse reaction to anesthesia    mother had difficlty waking   . 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 GERD (gastroesophageal reflux disease)   . Hyperlipidemia   . Osteomyelitis of right fibula ([place]) 03/05/2017  . PAD (peripheral artery disease) ([place])   . Paroxysmal atrial fibrillation with rapid ventricular response ([place]) 12/02/2017  . Ulcer    Foot    Assessment: 83 yof s/p L redo femoral to PTA bypass with cryopreserved vein with subsequent thrombectomy on 5/10, continuing on heparin for afib initiated post-op at 800 units/hr per Vascular. Pharmacy consulted to dose starting 5/11.. Previously on Xarelto for afib, but per med rec, last dose was >81mo ago and is only on aspirin PTA now.  Heparin level this AM is low but  starting to trend up, no issues per RN   Goal of Therapy:  Heparin level 0.3-0.7 units/ml Monitor platelets by anticoagulation protocol: Yes   Plan:  Increase heparin to 1450 units/hr (no bolus with recent procedure) 1330 HL  [name], PharmD, BCPS Clinical Pharmacist Phone: [phone]

## 2018-03-29 NOTE — Plan of Care (Signed)
  Problem: Education: Goal: Knowledge of General Education information will improve Outcome: Progressing   Problem: Health Behavior/Discharge Planning: Goal: Ability to manage health-related needs will improve Outcome: Progressing   Problem: Clinical Measurements: Goal: Ability to maintain clinical measurements within normal limits will improve Outcome: Progressing   

## 2018-03-29 NOTE — Discharge Instructions (Signed)

## 2018-03-29 NOTE — Progress Notes (Signed)
ABI's have been completed. Right 1.61 Left 1.29  03/29/18 2:58 PM Carlos Levering RVT

## 2018-03-29 NOTE — Anesthesia Postprocedure Evaluation (Signed)
Anesthesia Post Note  Patient: [name]  Procedure(s) Performed: BYPASS GRAFT FEMORAL-TIBIAL ARTERY LEFT REDO USING CRYOPRESERVED SAPHENOUS VEIN 70cm (Left Leg Upper) APPLICATION OF WOUND VAC LEFT GROIN USING PREVENA PLUS (Left Groin)     Patient location during evaluation: PACU Anesthesia Type: General Level of consciousness: awake and alert Pain management: pain level controlled Vital Signs Assessment: post-procedure vital signs reviewed and stable Respiratory status: patient connected to nasal cannula oxygen, spontaneous breathing and respiratory function stable Cardiovascular status: blood pressure returned to baseline Comments: Patient awake and alert in PACU. Vital signs are stable but no pulses can be detected in L. Foot. Patient will return  to OR for re-exploration.    Last Vitals:  Vitals:   03/29/18 0825 03/29/18 0911  BP: (!) 71/48 (!) 87/53  Pulse: 83   Resp: 14   Temp: 36.9 C   SpO2: 96%     Last Pain:  Vitals:   03/29/18 1149  TempSrc:   PainSc: 3                  Marquitta Persichetti COKER

## 2018-03-29 NOTE — Progress Notes (Signed)
ANTICOAGULATION CONSULT NOTE  Pharmacy Consult for Heparin > Xarelto Indication: atrial fibrillation  No Known Allergies  Patient Measurements: Height: 5\' 7"  (170.2 cm) Weight: 167 lb 5.3 oz (75.9 kg) IBW/kg (Calculated) : 61.6 Heparin Dosing Weight: 71.7  Vital Signs: Temp: 98.4 F (36.9 C) (05/12 0825) Temp Source: Oral (05/12 0825) BP: 87/53 (05/12 0911) Pulse Rate: 83 (05/12 0825)  Labs: Recent Labs    03/27/18 1310  03/27/18 1945 03/27/18 2351  03/28/18 0833 03/28/18 1703 03/29/18 0358 03/29/18 0737  HGB  --    < > 8.8* 8.2*  --   --   --  8.2*  --   HCT  --    < > 27.8* 25.5*  --   --   --  26.7*  --   PLT  --   --  304 264  --   --   --  251  --   APTT 28  --   --   --   --  54*  --  71*  --   HEPARINUNFRC  --   --   --   --    < > <0.10* <0.10* 0.12* <0.10*  CREATININE  --   --  0.83 0.90  --   --   --  0.95  --   TROPONINI  --   --  <0.03  --   --   --   --   --   --    < > = values in this interval not displayed.    Estimated Creatinine Clearance: 72.8 mL/min (by C-G formula based on SCr of 0.95 mg/dL).   Medical History: Past Medical History:  Diagnosis Date  . Arthritis   . Asthma   . Chronic back pain   . Depression   . Diabetes mellitus   . Diabetic neuropathy ([place])   . Family history of adverse reaction to anesthesia    mother had difficlty waking   . 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 GERD (gastroesophageal reflux disease)   . Hyperlipidemia   . 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 PAD (peripheral artery disease) ([place])   . Paroxysmal atrial fibrillation with rapid ventricular response ([place]) 12/02/2017  . Ulcer    Foot    Assessment: 61 yof s/p L redo femoral to PTA bypass with cryopreserved vein with subsequent thrombectomy on 5/10, continuing on heparin for afib initiated post-op at 800 units/hr per Vascular. Pharmacy consulted to dose starting 5/11. Previously on Xarelto for afib, but per med  rec, last dose was >45mo ago and was only taking aspirin PTA. Last Cardiology note from 12/2017 states she was to continue the Xarelto.  Pharmacy consulted to transition back to PTA Xarelto today - spoke with Dr. Donzetta Matters and confirms she needs to resume on Xarelto regardless. CBC stable, no bleed documented.  Goal of Therapy:  Stroke prevention Monitor platelets by anticoagulation protocol: Yes   Plan:  D/c heparin at time of 1st dose of Xarelto - communicated plan with RN Resume Xarelto 20mg  PO Qsupper Monitor CBC, s/sx bleeding  Elicia Lamp, PharmD, BCPS Clinical Pharmacist Clinical phone for 03/29/2018 until 3:30pm: [phone] If after 3:30pm, please call main pharmacy at: [phone] 03/29/2018 10:29 AM

## 2018-03-29 NOTE — Evaluation (Signed)
Physical Therapy Evaluation Patient Details Name: [name] MRN: [id number] DOB: 25-Nov-1965 Today's Date: 03/29/2018   History of Present Illness  52 y.o. female who is s/p ABF and bilateral fem pop BPG.  Her left fem pop was confirmed occluded and amitted for redo BPG. PMhx: asthma, depression, DM  Clinical Impression  PT pleasant, premedicated and willing to attempt limited mobility. Pt reports caring for herself at home despite pain and walking limited household distance grossly 60' max. Pt with decreased strength, mobility, ROM and gait who will benefit from acute therapy to maximize mobility, activity and function to decrease burden of care.     Follow Up Recommendations Home health PT;Supervision for mobility/OOB    Equipment Recommendations  None recommended by PT    Recommendations for Other Services       Precautions / Restrictions Precautions Precautions: Fall Precaution Comments: necrotic toes, VAC LLE      Mobility  Bed Mobility Overal bed mobility: Modified Independent             General bed mobility comments: pt able to transition to and from sidelying with increased time and no assist  Transfers Overall transfer level: Needs assistance   Transfers: Sit to/from Stand Sit to Stand: Min guard         General transfer comment: cues for hand placement and safety  Ambulation/Gait Ambulation/Gait assistance: Min guard Ambulation Distance (Feet): 15 Feet Assistive device: Rolling walker (2 wheeled) Gait Pattern/deviations: Step-to pattern;Trunk flexed;Decreased stance time - left   Gait velocity interpretation: <1.31 ft/sec, indicative of household ambulator General Gait Details: pt with step to pattern with decreased stance on LLE. cues for posture and position in RW. decreased stance and endurance due to pain. Pt declined increased distance  Stairs            Wheelchair Mobility    Modified Rankin (Stroke Patients Only)        Balance Overall balance assessment: Needs assistance   Sitting balance-Leahy Scale: Good       Standing balance-Leahy Scale: Poor                               Pertinent Vitals/Pain Pain Assessment: 0-10 Pain Score: 6  Pain Location: LLE foot and thigh Pain Descriptors / Indicators: Aching;Constant Pain Intervention(s): Limited activity within patient's tolerance;Repositioned;Premedicated before session;Monitored during session    Home Living Family/patient expects to be discharged to:: Private residence Living Arrangements: Spouse/significant other;Other relatives Available Help at Discharge: Family;Available 24 hours/day Type of Home: House Home Access: Ramped entrance     Home Layout: One level Home Equipment: Walker - 2 wheels;Cane - single point;Bedside commode;Walker - 4 wheels;Shower seat      Prior Function Level of Independence: Independent with assistive device(s)         Comments: has been walking with rollator and limited distance for last 2 weeks due to pain after fall     Hand Dominance        Extremity/Trunk Assessment   Upper Extremity Assessment Upper Extremity Assessment: Generalized weakness    Lower Extremity Assessment Lower Extremity Assessment: Generalized weakness;LLE deficits/detail LLE Deficits / Details: decreased strength and ROM with 3 necrotic toes and decreased sensation    Cervical / Trunk Assessment Cervical / Trunk Assessment: Kyphotic  Communication   Communication: No difficulties  Cognition Arousal/Alertness: Awake/alert Behavior During Therapy: WFL for tasks assessed/performed Overall Cognitive Status: Within Functional Limits for tasks assessed  General Comments      Exercises     Assessment/Plan    PT Assessment Patient needs continued PT services  PT Problem List Decreased strength;Decreased mobility;Decreased activity  tolerance;Decreased range of motion;Decreased knowledge of use of DME;Pain;Impaired sensation;Decreased balance;Decreased skin integrity       PT Treatment Interventions Gait training;Therapeutic exercise;Patient/family education;Functional mobility training;DME instruction;Therapeutic activities    PT Goals (Current goals can be found in the Care Plan section)  Acute Rehab PT Goals Patient Stated Goal: return home PT Goal Formulation: With patient Time For Goal Achievement: 04/12/18 Potential to Achieve Goals: Fair    Frequency Min 3X/week   Barriers to discharge        Co-evaluation               AM-PAC PT "6 Clicks" Daily Activity  Outcome Measure Difficulty turning over in bed (including adjusting bedclothes, sheets and blankets)?: A Little Difficulty moving from lying on back to sitting on the side of the bed? : A Little Difficulty sitting down on and standing up from a chair with arms (e.g., wheelchair, bedside commode, etc,.)?: A Little Help needed moving to and from a bed to chair (including a wheelchair)?: A Little Help needed walking in hospital room?: A Little Help needed climbing 3-5 steps with a railing? : A Lot 6 Click Score: 17    End of Session Equipment Utilized During Treatment: Gait belt Activity Tolerance: Patient tolerated treatment well Patient left: in bed;with call bell/phone within reach(pt refused OOB to chair despite cushions to raise and soften seat) Nurse Communication: Mobility status PT Visit Diagnosis: Other abnormalities of gait and mobility (R26.89);Muscle weakness (generalized) (M62.81);Pain Pain - Right/Left: Left Pain - part of body: Leg    Time: 0940-7680 PT Time Calculation (min) (ACUTE ONLY): 21 min   Charges:   PT Evaluation $PT Eval Moderate Complexity: 1 Mod     PT G Codes:        Elwyn Reach, PT [phone]   [place] 03/29/2018, 12:15 PM

## 2018-03-30 ENCOUNTER — Encounter (HOSPITAL_COMMUNITY): Payer: Self-pay | Admitting: Surgery

## 2018-03-30 ENCOUNTER — Other Ambulatory Visit (HOSPITAL_COMMUNITY): Payer: Medicare Other

## 2018-03-30 ENCOUNTER — Encounter (HOSPITAL_COMMUNITY): Payer: Medicare Other

## 2018-03-30 ENCOUNTER — Ambulatory Visit: Payer: Medicare Other | Admitting: Infectious Disease

## 2018-03-30 ENCOUNTER — Ambulatory Visit: Payer: Medicare Other | Admitting: Surgery

## 2018-03-30 LAB — GLUCOSE, CAPILLARY
GLUCOSE-CAPILLARY: 113 mg/dL — AB (ref 65–99)
GLUCOSE-CAPILLARY: 77 mg/dL (ref 65–99)
Glucose-Capillary: 82 mg/dL (ref 65–99)
Glucose-Capillary: 126 mg/dL — ABNORMAL HIGH (ref 65–99)

## 2018-03-30 LAB — CBC
HCT: 26.5 % — ABNORMAL LOW (ref 36.0–46.0)
Hemoglobin: 8.2 g/dL — ABNORMAL LOW (ref 12.0–15.0)
MCH: 24.2 pg — ABNORMAL LOW (ref 26.0–34.0)
MCHC: 30.9 g/dL (ref 30.0–36.0)
MCV: 78.2 fL (ref 78.0–100.0)
PLATELETS: 263 10*3/uL (ref 150–400)
RBC: 3.39 MIL/uL — ABNORMAL LOW (ref 3.87–5.11)
RDW: 16.1 % — AB (ref 11.5–15.5)
WBC: 9.2 10*3/uL (ref 4.0–10.5)

## 2018-03-30 NOTE — Evaluation (Signed)
Occupational Therapy Evaluation Patient Details Name: [name] MRN: [id number] DOB: [birth year] Today's Date: 03/30/2018    History of Present Illness 52 y.o. female who is s/p ABF and bilateral fem pop BPG.  Her left fem pop was confirmed occluded and amitted for redo BPG. PMhx: asthma, depression, DM   Clinical Impression   PTA, pt was living with her husband and was independent with BADLs (per patient) and using RW and rollator for functional mobility. Pt currently requiring Min A for UB ADLs, Mod-Max A for LB ADLs, and Min Guard A for toileting and functional mobility with RW. Pt presenting with poor endurance, ROM, strength, and functional performance and is limited by pain at LLE and poor activity tolerance. Pt would benefit from further acute OT to facilitate safe dc. Recommend dc to home with HHOT for further OT to optimize safety, independence with ADLs, and return to PLOF.      Follow Up Recommendations  Home health OT;Supervision/Assistance - 24 hour    Equipment Recommendations  None recommended by OT    Recommendations for Other Services PT consult     Precautions / Restrictions Precautions Precautions: Fall Precaution Comments: necrotic toes, VAC LLE      Mobility Bed Mobility Overal bed mobility: Modified Independent             General bed mobility comments: pt able to transition to and from sidelying with increased time and no assist  Transfers Overall transfer level: Needs assistance   Transfers: Sit to/from Stand Sit to Stand: Min guard         General transfer comment: cues for hand placement and safety    Balance Overall balance assessment: Needs assistance Sitting-balance support: No upper extremity supported;Feet supported Sitting balance-Leahy Scale: Good       Standing balance-Leahy Scale: Poor Standing balance comment: Reliant on UE support                           ADL either performed or assessed with clinical  judgement   ADL Overall ADL's : Needs assistance/impaired Eating/Feeding: Set up;Sitting   Grooming: Set up;Sitting   Upper Body Bathing: Minimal assistance;Sitting   Lower Body Bathing: Moderate assistance;Sit to/from stand   Upper Body Dressing : Set up;Supervision/safety;Sitting   Lower Body Dressing: Maximal assistance;Sit to/from stand Lower Body Dressing Details (indicate cue type and reason): donning socks at EOB and requiiring Max A. Pain with donning socks on right foot. Min Guard A to sit<>stand from [employer]: Min Geneticist, molecular Details (indicate cue type and reason): Min Guard for safety and stating "take your hand off me. it is hot hot hot"  Toileting- Clothing Manipulation and Hygiene: Min guard;Sitting/lateral lean       Functional mobility during ADLs: Min guard;Rolling walker General ADL Comments: Pt with limited funcitonal performance due to self limiting behavior, pain in LLE, and decreased activity toilerance.      Vision Baseline Vision/History: Wears glasses Wears Glasses: Reading only Patient Visual Report: No change from baseline(Doesnt wear them)       Perception     Praxis      Pertinent Vitals/Pain Pain Assessment: 0-10 Pain Score: 10-Worst pain ever Faces Pain Scale: Hurts even more Pain Location: LLE foot and thigh Pain Descriptors / Indicators: Aching;Constant Pain Intervention(s): Monitored during session;Limited activity within patient's tolerance;Repositioned;Premedicated before session;RN gave pain meds during session     Hand Dominance Right   Extremity/Trunk Assessment  Upper Extremity Assessment Upper Extremity Assessment: Generalized weakness   Lower Extremity Assessment Lower Extremity Assessment: LLE deficits/detail LLE Deficits / Details: decreased strength and ROM with 3 necrotic toes and decreased sensation   Cervical / Trunk Assessment Cervical / Trunk Assessment: Kyphotic    Communication Communication Communication: No difficulties   Cognition Arousal/Alertness: Awake/alert Behavior During Therapy: WFL for tasks assessed/performed Overall Cognitive Status: Within Functional Limits for tasks assessed                                 General Comments: Self limiting behavior. Feel near baseline function.    General Comments  Pt reporting nausea    Exercises     Shoulder Instructions      Home Living Family/patient expects to be discharged to:: Private residence Living Arrangements: Spouse/significant other;Other relatives(Father in law) Available Help at Discharge: Family;Available 24 hours/day("Husband is off and on") Type of Home: House Home Access: Ramped entrance     Home Layout: One level     Bathroom Shower/Tub: Occupational psychologist: Standard     Home Equipment: Environmental consultant - 2 wheels;Cane - single point;Bedside commode;Walker - 4 wheels;Shower seat(rollator)          Prior Functioning/Environment Level of Independence: Independent with assistive device(s)        Comments: has been walking with rollator and limited distance for last 2 weeks due to pain after fall. Pt reporting she performs BADLs. Family performing IADLs.        OT Problem List: Decreased strength;Decreased range of motion;Decreased activity tolerance;Impaired balance (sitting and/or standing);Decreased cognition;Decreased safety awareness;Decreased knowledge of use of DME or AE;Pain      OT Treatment/Interventions: Self-care/ADL training;Therapeutic exercise;Energy conservation;DME and/or AE instruction;Therapeutic activities;Patient/family education    OT Goals(Current goals can be found in the care plan section) Acute Rehab OT Goals Patient Stated Goal: return home OT Goal Formulation: With patient Time For Goal Achievement: 04/13/18 Potential to Achieve Goals: Good ADL Goals Pt Will Perform Grooming: with set-up;with  supervision;standing Pt Will Perform Upper Body Dressing: with set-up;sitting Pt Will Perform Lower Body Dressing: with set-up;with supervision;sit to/from stand Pt Will Transfer to Toilet: with set-up;with supervision;ambulating;bedside commode Pt Will Perform Toileting - Clothing Manipulation and hygiene: with supervision;with set-up;sit to/from stand  OT Frequency: Min 2X/week   Barriers to D/C:            Co-evaluation              AM-PAC PT "6 Clicks" Daily Activity     Outcome Measure Help from another person eating meals?: A Little Help from another person taking care of personal grooming?: A Little Help from another person toileting, which includes using toliet, bedpan, or urinal?: A Little Help from another person bathing (including washing, rinsing, drying)?: A Lot Help from another person to put on and taking off regular upper body clothing?: A Little Help from another person to put on and taking off regular lower body clothing?: A Lot 6 Click Score: 16   End of Session Equipment Utilized During Treatment: Gait belt;Rolling walker Nurse Communication: Mobility status  Activity Tolerance: Patient limited by pain;Patient limited by fatigue Patient left: in bed;with call bell/phone within reach  OT Visit Diagnosis: Unsteadiness on feet (R26.81);Other abnormalities of gait and mobility (R26.89);Muscle weakness (generalized) (M62.81);Pain Pain - Right/Left: Left Pain - part of body: Ankle and joints of foot  Time: 8934-0684 OT Time Calculation (min): 24 min Charges:  OT General Charges $OT Visit: 1 Visit OT Evaluation $OT Eval Moderate Complexity: 1 Mod OT Treatments $Self Care/Home Management : 8-22 mins G-Codes:     Jaydee Ingman MSOT, OTR/L Acute Rehab Pager: [phone] Office: [place] 03/30/2018, 12:51 PM

## 2018-03-30 NOTE — Progress Notes (Signed)
Inpatient Diabetes Program Recommendations  AACE/ADA: New Consensus Statement on Inpatient Glycemic Control (2015)  Target Ranges:  Prepandial:   less than 140 mg/dL      Peak postprandial:   less than 180 mg/dL (1-2 hours)      Critically ill patients:  140 - 180 mg/dL   Lab Results  Component Value Date   GLUCAP 113 (H) 03/30/2018   HGBA1C 8.4 (H) 03/24/2018    Review of Glycemic ControlResults for HEAVENLEE, MAIORANA (MRN [id number]) as of 03/30/2018 12:53  Ref. Range 03/29/2018 06:28 03/29/2018 11:26 03/29/2018 16:08 03/29/2018 22:10 03/30/2018 06:12 03/30/2018 12:20  Glucose-Capillary Latest Ref Range: 65 - 99 mg/dL 79 99 67 131 (H) 82 113 (H)    Diabetes history: Type 2 DM  Outpatient Diabetes medications: Humalog 10 units tid with meals, Lantus 36 units bid  Current orders for Inpatient glycemic control:  Novolog 10 units tid with meals, Lantus 36 units bid  Inpatient Diabetes Program Recommendations:    Please d/c Novolog with meals while in the hospital and instead add Novolog sensitive correction tid with meals.  Also consider reducing Lantus to 30 units bid.    Thanks,  Adah Perl, RN, BC-ADM Inpatient Diabetes Coordinator Pager [phone] (8a-5p)

## 2018-03-30 NOTE — Consult Note (Signed)
[facility] Nurse wound consult note Reason for Consult: Right buttock wound Wound type: Area is a heavily contracted wound area that the patient states has had surgery on it in the past.  Unclear etiology Pressure Injury POA: Yes Measurement: Irregularly shaped 1 cm x 1 cm x 1 cm.   Wound bed: What I can see of the area is clean and pink Drainage (amount, consistency, odor) Brown drainage on the foam dressing.  There is an odor, but I am unsure of the origin of the odor....could be perineal in origin. Periwound: Heavily contracted Dressing procedure/placement/frequency: Cleanse area with saline, pat dry.  Pack Aquacell Ag+ hydrofiber Kellie Simmering [phone]) into wound bed.  Cover with a foam dressing.  Change every other day. Monitor the wound area(s) for worsening of condition such as: Signs/symptoms of infection,  Increase in size,  Development of or worsening of odor, Development of pain, or increased pain at the affected locations.  Notify the medical team if any of these develop.  Thank you for the consult.  Discussed plan of care with the patient and bedside nurse.  [facility] nurse will not follow at this time.  Please re-consult the [facility] team if needed.  Val Riles, RN, MSN, CWOCN, CNS-BC, pager [phone]

## 2018-03-30 NOTE — Progress Notes (Signed)
[facility] [place] [facility]  Subjective  - Doing OK over all.   Objective (!) 99/54 83 99.2 F (37.3 C) (Oral) (!) 28 93%  Intake/Output Summary (Last 24 hours) at 03/30/2018 0713 Last data filed at 03/30/2018 0300 Gross per 24 hour  Intake -  Output 950 ml  Net -950 ml    Doppler DP/PT left signal Groin soft with incisional wound vac in place, leg incisions healing L LE Heart RRR Lungs non labored Gen NAD poor appetite    Assessment/Planning: POD # 3 52 y.o. female is s/p L redo femoral to PTA bypass with cryopreserved vein with subsequent thrombectomy   Patient has been started on Xarelto  Doppler signals intact left LE patent by pass, foot warm. Mobility issues PT/OT HGB 8.2 stable asymptomatic  Encourage PO intake to help heal her incisions   [name] 03/30/2018 7:13 AM --  Laboratory Lab Results: Recent Labs    03/29/18 0358 03/30/18 0255  WBC 9.6 9.2  HGB 8.2* 8.2*  HCT 26.7* 26.5*  PLT 251 263   BMET Recent Labs    03/27/18 2351 03/29/18 0358  NA 140 136  K 5.5* 4.2  CL 108 101  CO2 26 28  GLUCOSE 132* 87  BUN 9 11  CREATININE 0.90 0.95  CALCIUM 6.0* 8.0*    COAG Lab Results  Component Value Date   INR 1.15 03/24/2018   INR 1.40 11/28/2017   INR 1.30 11/28/2017   No results found for: PTT

## 2018-03-30 NOTE — Progress Notes (Signed)
[facility]  Patient Status: Active (receiving services up to time of hospitalization)  [facility] is providing the following services: RN  If patient discharges after hours, please call [phone].   Janae Sauce 03/30/2018, 1:21 PM

## 2018-03-31 LAB — CBC
HEMATOCRIT: 26.5 % — AB (ref 36.0–46.0)
HEMOGLOBIN: 8.2 g/dL — AB (ref 12.0–15.0)
MCH: 24.6 pg — ABNORMAL LOW (ref 26.0–34.0)
MCHC: 30.9 g/dL (ref 30.0–36.0)
MCV: 79.6 fL (ref 78.0–100.0)
Platelets: 270 10*3/uL (ref 150–400)
RBC: 3.33 MIL/uL — ABNORMAL LOW (ref 3.87–5.11)
RDW: 16.2 % — ABNORMAL HIGH (ref 11.5–15.5)
WBC: 8.8 10*3/uL (ref 4.0–10.5)

## 2018-03-31 LAB — BASIC METABOLIC PANEL
Anion gap: 11 (ref 5–15)
BUN: 15 mg/dL (ref 6–20)
CHLORIDE: 101 mmol/L (ref 101–111)
CO2: 24 mmol/L (ref 22–32)
CREATININE: 1.07 mg/dL — AB (ref 0.44–1.00)
Calcium: 7.9 mg/dL — ABNORMAL LOW (ref 8.9–10.3)
GFR calc Af Amer: >60 mL/min (ref >60)
GFR calc non Af Amer: 58 mL/min — ABNORMAL LOW (ref >60)
GLUCOSE: 55 mg/dL — AB (ref 65–99)
Potassium: 4.1 mmol/L (ref 3.5–5.1)
Sodium: 136 mmol/L (ref 135–145)

## 2018-03-31 LAB — GLUCOSE, CAPILLARY
GLUCOSE-CAPILLARY: 99 mg/dL (ref 65–99)
Glucose-Capillary: 76 mg/dL (ref 65–99)
Glucose-Capillary: 64 mg/dL — ABNORMAL LOW (ref 65–99)
Glucose-Capillary: 70 mg/dL (ref 65–99)
Glucose-Capillary: 90 mg/dL (ref 65–99)
Glucose-Capillary: 64 mg/dL — ABNORMAL LOW (ref 65–99)

## 2018-03-31 MED ORDER — INSULIN GLARGINE 100 UNIT/ML ~~LOC~~ SOLN: 32.0000 [IU] | Freq: Two times a day (BID) | SUBCUTANEOUS | 11 refills | Status: DC

## 2018-03-31 MED ORDER — SODIUM CHLORIDE 0.9 % IV BOLUS
500.0000 mL | Freq: Once | INTRAVENOUS | Status: AC
  Administered 2018-03-31: 500 mL via INTRAVENOUS

## 2018-03-31 MED ORDER — INSULIN GLARGINE 100 UNIT/ML ~~LOC~~ SOLN
32.0000 [IU] | Freq: Two times a day (BID) | SUBCUTANEOUS | Status: DC
  Filled 2018-03-31: qty 0.32

## 2018-03-31 NOTE — Care Management Important Message (Signed)
Important Message  Patient Details  Name: [name] MRN: [id number] Date of Birth: 01-02-1966   Medicare Important Message Given:  Yes    Jahnessa Vanduyn P Shye Doty 03/31/2018, 1:47 PM

## 2018-03-31 NOTE — Progress Notes (Addendum)
Physical Therapy Treatment Patient Details Name: [name] MRN: [id number] DOB: 1966-03-17 Today's Date: 03/31/2018    History of Present Illness Pt is a 52 y.o. female admitted 03/27/18 due to confirmed occlusion of left femoral-popliteal bypass graft, now s/p ABF and bilateral fem pop BPG. PMH includes asthma, depression, DM.   PT Comments    Pt slowly progressing with mobility; remains limited by c/o chronic back pain. Able to transfers from bed<>BSC with RW and supervision for safety; amb 30' in room with RW and intermittent min guard. Pt with L foot drop; rep from [employer] present to discuss potential for AFO. Due to swelling, edema, and current drainage requiring bandages at home (that pt reports has been present ~4 months), do not feel appropriate for current AFO-wear. Discussed potential for this in the future. Pt with good ability to compensate for L foot drop over short distances on even surfaces with use of RW. Pt with difficulty with prolonged standing while performing ADLs, also with difficulty ambulating longer distances significantly limiting ability to mobilize outside of the home; based on this, recommend pt d/c home with manual wheelchair.   Follow Up Recommendations  Home health PT;Supervision for mobility/OOB     Equipment Recommendations  Wheelchair (measurements PT);Wheelchair cushion (measurements PT)    Recommendations for Other Services       Precautions / Restrictions Precautions Precautions: Fall Precaution Comments: necrotic toes, VAC LLE Restrictions Weight Bearing Restrictions: No    Mobility  Bed Mobility               General bed mobility comments: Received sitting EOB  Transfers Overall transfer level: Needs assistance   Transfers: Sit to/from Stand Sit to Stand: Supervision;From elevated surface         General transfer comment: Pt insistent on bed being raised; supervision standing from bed and BSC, heavy reliance on  BUEs  Ambulation/Gait Ambulation/Gait assistance: Min guard Ambulation Distance (Feet): 30 Feet Assistive device: Rolling walker (2 wheeled) Gait Pattern/deviations: Step-to pattern;Trunk flexed;Decreased stance time - left Gait velocity: Decreased   General Gait Details: Slow, antalgic amb with RW and intermittent min guard for balance. No overt LOB or instability   Stairs             Wheelchair Mobility    Modified Rankin (Stroke Patients Only)       Balance Overall balance assessment: Needs assistance Sitting-balance support: No upper extremity supported;Feet supported Sitting balance-Leahy Scale: Good       Standing balance-Leahy Scale: Poor Standing balance comment: Reliant on UE support                            Cognition Arousal/Alertness: Awake/alert Behavior During Therapy: WFL for tasks assessed/performed Overall Cognitive Status: Within Functional Limits for tasks assessed                                        Exercises      General Comments        Pertinent Vitals/Pain Pain Assessment: Faces Faces Pain Scale: Hurts little more Pain Location: Back Pain Descriptors / Indicators: Aching;Constant Pain Intervention(s): Monitored during session;Limited activity within patient's tolerance    Home Living                      Prior Function  PT Goals (current goals can now be found in the care plan section) Acute Rehab PT Goals Patient Stated Goal: return home PT Goal Formulation: With patient Time For Goal Achievement: 04/12/18 Potential to Achieve Goals: Fair Progress towards PT goals: Progressing toward goals    Frequency    Min 3X/week      PT Plan Current plan remains appropriate    Co-evaluation              AM-PAC PT "6 Clicks" Daily Activity  Outcome Measure  Difficulty turning over in bed (including adjusting bedclothes, sheets and blankets)?: None Difficulty  moving from lying on back to sitting on the side of the bed? : None Difficulty sitting down on and standing up from a chair with arms (e.g., wheelchair, bedside commode, etc,.)?: A Little Help needed moving to and from a bed to chair (including a wheelchair)?: A Little Help needed walking in hospital room?: A Little Help needed climbing 3-5 steps with a railing? : A Lot 6 Click Score: 19    End of Session Equipment Utilized During Treatment: Gait belt Activity Tolerance: Patient tolerated treatment well;Patient limited by pain Patient left: in bed;with call bell/phone within reach;with family/visitor present(sitting EOB) Nurse Communication: Mobility status PT Visit Diagnosis: Other abnormalities of gait and mobility (R26.89);Muscle weakness (generalized) (M62.81);Pain Pain - Right/Left: Left Pain - part of body: Leg     Time: 1552-0802 PT Time Calculation (min) (ACUTE ONLY): 27 min  Charges:  $Therapeutic Activity: 8-22 mins $Self Care/Home Management: 8-22                    G Codes:      Mabeline Caras, PT, DPT Acute Rehab Services  Pager: [place] 03/31/2018, 10:46 AM

## 2018-03-31 NOTE — Progress Notes (Signed)
  Progress Note    03/31/2018 7:27 AM 4 Days Post-Op  Subjective:  Patient is wanting to be discharged home this morning   Vitals:   03/31/18 0007 03/31/18 0412  BP: (!) 95/56 (!) 79/53  Pulse: 75 80  Resp:  (!) 26  Temp: 99 F (37.2 C) 98 F (36.7 C)  SpO2: 96% 98%   Physical Exam: Lungs:  Non labored breathing Incisions:  L groin incision vac in place; L thigh incision healing well; pitting edema to the level of proximal shin LLE; lower leg incision c/d/i Extremities:  Bandages of foot left in place this morning; L PTA signal by doppler Abdomen:  Soft Neurologic: A&O  CBC    Component Value Date/Time   WBC 9.2 03/30/2018 0255   RBC 3.39 (L) 03/30/2018 0255   HGB 8.2 (L) 03/30/2018 0255   HCT 26.5 (L) 03/30/2018 0255   PLT 263 03/30/2018 0255   MCV 78.2 03/30/2018 0255   MCH 24.2 (L) 03/30/2018 0255   MCHC 30.9 03/30/2018 0255   RDW 16.1 (H) 03/30/2018 0255   LYMPHSABS 1.8 03/29/2018 0358   MONOABS 0.8 03/29/2018 0358   EOSABS 0.2 03/29/2018 0358   BASOSABS 0.0 03/29/2018 0358    BMET    Component Value Date/Time   NA 136 03/29/2018 0358   K 4.2 03/29/2018 0358   CL 101 03/29/2018 0358   CO2 28 03/29/2018 0358   GLUCOSE 87 03/29/2018 0358   BUN 11 03/29/2018 0358   CREATININE 0.95 03/29/2018 0358   CREATININE 1.24 (H) 08/12/2017 1530   CALCIUM 8.0 (L) 03/29/2018 0358   GFRNONAA >60 03/29/2018 0358   GFRNONAA 50 (L) 08/12/2017 1530   GFRAA >60 03/29/2018 0358   GFRAA 57 (L) 08/12/2017 1530    INR    Component Value Date/Time   INR 1.15 03/24/2018 1440     Intake/Output Summary (Last 24 hours) at 03/31/2018 0727 Last data filed at 03/31/2018 0500 Gross per 24 hour  Intake 1812 ml  Output 250 ml  Net 1562 ml     Assessment/Plan:  52 y.o. female is s/p  L redo femoral to PTA bypass with cryopreserved vein with subsequent thrombectomy   4 Days Post-Op   Continue Xarelto Case management consulted to arrange [facility] PT/OT Encouraged elevation of  LLE when sitting Possible discharge home today; awaiting HH and morning labs   Dagoberto Ligas, PA-C [facility] [phone] 03/31/2018 7:27 AM

## 2018-03-31 NOTE — Care Management Note (Addendum)
Case Management Note [name] RN, [name] Unit 4E-Case Manager [phone]  Patient Details  Name: [name] MRN: [id number] Date of Birth: [birth year]  Subjective/Objective:   Pt admitted s/p L redo femoral to PTA bypass with cryopreserved vein with subsequent thrombectomy                Action/Plan: PTA pt lived at home with spouse- active with [employer] for [facility] services- orders have been placed for RN/PT/OT- and DME- wheelchair- spoke with pt at bedside- confirmed agency with pt and that she wants to continue services with [name]- pt also spoke about needing w/c- PT at the bedside also and agreeable- will place narrative note for DME w/c- pt goes to the [name] wound center for wound care. Call made to [name] with [name] for resumption of [name] and DME need- wheelchair to be delivered to room once approved prior to discharge.  Pt will go home with prevena wound VAC-   Expected Discharge Date:                  Expected Discharge Plan:  [name]  In-House Referral:  NA  Discharge planning Services  CM Consult  Post Acute Care Choice:  Durable Medical Equipment, Home Health, Resumption of [name] Provider Choice offered to:  Patient  DME Arranged:  Wheelchair manual DME Agency:  [name]:  PT, [name]:  [name]  Status of Service:  Completed, signed off  If discussed at [place] of Stay Meetings, dates discussed:    Discharge Disposition: home/home health   Additional Comments:  Dawayne Patricia, RN 03/31/2018, 10:24 AM

## 2018-03-31 NOTE — Progress Notes (Signed)
Orthopedic Tech Progress Note Patient Details:  [name] 15-Jan-1966 [id number]  Patient ID: [name], female   DOB: 07/21/1966, 52 y.o.   MRN: [id number]   [place] Pink 03/31/2018, 10:11 AMCalled [employer] for ankle brace.

## 2018-03-31 NOTE — Progress Notes (Signed)
    Durable Medical Equipment  (From admission, onward)        Start     Ordered   03/31/18 0916  For home use only DME high strength lightweight manual wheelchair with seat cushion  Once    Comments:  Patient suffers from PAD with gangrenous L foot and deconditioning s/p bypass surgery which impairs their ability to perform daily activities like bathing, dressing and grooming in the home.  A walker will not resolve  issue with performing activities of daily living. A wheelchair will allow patient to safely perform daily activities.  (THEN ONE OF THESE TWO:) Patient self-propels the wheelchair while engaging in frequent activities such as laundry, meals and toileting which cannot be performed in a standard or lightweight wheelchair due to the weight of the chair. Accessories: elevating leg rests (ELRs), wheel locks, extensions and anti-tippers.   03/31/18 0920      Mabeline Caras, PT, DPT Acute Rehab Services  Pager: [phone]

## 2018-03-31 NOTE — Progress Notes (Signed)
Pt's BP was 79/59. HR was about 80 & pt asymptomatic.  Dr. Donnetta Hutching was informed and ordered CBC & BMET blood draws and a one time 500 cc bolus of normal saline by IV.  Will continue to monitor pt.  Lupita [name], RN

## 2018-04-01 ENCOUNTER — Telehealth: Payer: Self-pay | Admitting: Surgery

## 2018-04-01 LAB — TYPE AND SCREEN
ABO/RH(D): O NEG
ANTIBODY SCREEN: NEGATIVE
UNIT DIVISION: 0
UNIT DIVISION: 0

## 2018-04-01 LAB — BPAM RBC
BLOOD PRODUCT EXPIRATION DATE: 201906012359
BLOOD PRODUCT EXPIRATION DATE: 201906042359
ISSUE DATE / TIME: 201905101436
ISSUE DATE / TIME: 201905101436
UNIT TYPE AND RH: 9500
UNIT TYPE AND RH: 9500

## 2018-04-01 NOTE — Telephone Encounter (Signed)
Sched appt 04/20/18 at 4:00. Spoke to pt to inform pt of appt.

## 2018-04-01 NOTE — Telephone Encounter (Signed)
-----   Message from Mena Goes, RN sent at 04/01/2018  1:14 PM EDT ----- Regarding: 2 weeks    ----- Message ----- From: Iline Oven Sent: 04/01/2018  12:47 PM To: Mena Goes, RN Subject: RE: When to see her?                           2 weeks to see Dr. Trula Slade in clinic please.  Thanks, Matt ----- Message ----- From: Mena Goes, RN Sent: 04/01/2018  12:02 PM To: Dagoberto Ligas, PA-C Subject: When to see her?                               I think this patient went home yesterday but we did not get a message as to when she needs to see Dr. Trula Slade again. Please advise, thanks.   Zigmund Daniel OCD queen

## 2018-04-02 ENCOUNTER — Other Ambulatory Visit: Payer: Self-pay | Admitting: Surgery

## 2018-04-02 NOTE — Discharge Summary (Signed)
Physician Discharge Summary   Patient ID: [name] [id number] 51 y.o. Nov 06, 1966  Admit date: 03/27/2018  Discharge date and time: 03/31/2018  4:00 PM   Admitting Physician: Serafina Mitchell, MD   Discharge Physician: same  Admission Diagnoses: PERIPHERAL VASCULAR DISEASE  Discharge Diagnoses: same, Limited mobility requiring wheelchair  Admission Condition: fair  Discharged Condition: fair  Indication for Admission: left foot ulcer, occluded left fem-pop bypass graft  Hospital Course:  [name] in a 51y.o. Female with chronic wounds of L foot with history of ABF and L fem-pop bypass.  L fem-pop was found to be occluded and she was brought to the hospital to undergo redo L femoral to PTA bypass with cryopreserved vein by Dr. Trula Slade on 03/27/18.  She became hypotensive in PACU and later lost doppler signals to her foot.  She was brought back to the OR for thrombectomy of LLE bypass.  She tolerated this procedure well and was kepy on IV heparin post operatively.  POD#1 bypass remained patent and she remained on IV heparin.  POD#2 bypass remained patent.  Pharmacy transitioned patient from IV heparin back to home dose of Xarelto.  PT/OT were consulted.  And recommended [facility] PT/OT as well as 3 in 1 commode and wheelchair.  Bypass was assessed on discharge day and was patent based on doppler signal.  At this point pain management was adequate and patient was feeling fit for discharge home.  We have asked her to keep incisional Prevena vac on L groin incision until Friday 04/03/18.  She was prescribed 1-2 days of narcotic pain medication for continued post operative pain control.  She will follow up in office in about 2 weeks at which point bypass will be re-assessed and we will discuss treatment of L foot wounds.  Case manager arranged [facility] PT/OT as well as equipment.  Discharge instructions were reviewed with the patient and she voiced her understanding.  She was discharged in fair  condition.  Consults: None  Treatments: surgery: Dr. Trula Slade on 03/27/18 #1: Redo exposure of left common femoral artery                    #2: Redo exposure left posterior tibial artery  #3: Left femoral to posterior tibial artery bypass graft with cryopreserved vein  #4: Application of Praveena wound VAC  Dr. Trula Slade on 03/27/18: #1: Thrombectomy of left femoral posterior tibial bypass graft #2: Intraoperative arteriogram #3: Intra-arterial administration of nitroglycerin           #4: Placement of Praveena incisional wound VAC   Discharge Exam: see progress note Vitals:   03/31/18 1111 03/31/18 1357  BP: 92/64 (!) 82/58  Pulse: 84   Resp: (!) 21 (!) 21  Temp:    SpO2: 90%     Disposition: home with home PT/OT  - For VQI Registry use ---  Post-op:  Wound infection: No  Graft infection: No  Transfusion: Yes   New Arrhythmia: No Ipsilateral amputation: [x ] no, [ ]  Minor, [ ]  BKA, [ ]  AKA Patency judged by: [x ] Dopper only, [ ]  Palpable graft pulse, [ ]  Palpable distal pulse, [ ]  ABI inc. > 0.15, [ ]  Duplex Discharge ABI: R 1.61, L 1.29 D/C Ambulatory Status: Ambulatory with Assistance/ wheelchair  Complications: MI: [ x] No, [ ]  Troponin only, [ ]  EKG or Clinical CHF: No Resp failure: [x ] none, [ ]  Pneumonia, [ ]  Ventilator Chg in renal function: [ ]  none, [x ]  Inc. Cr > 0.5, [ ]  Temp. Dialysis, [ ]  Permanent dialysis Stroke: [x ] None, [ ]  Minor, [ ]  Major Return to OR: Yes  Reason for return to OR: [ ]  Bleeding, [ ]  Infection, [x ] Thrombosis, [ ]  Revision  Discharge medications: Statin use:  Yes ASA use:  Yes Plavix use:  No  for medical reason on Xarelto Beta blocker use: Yes Coumadin use: Yes, Xarelto    Patient Instructions:  Allergies as of 03/31/2018   No Known Allergies     Medication List    TAKE these medications   albuterol 2 MG tablet Commonly known as:  PROVENTIL Take 2 mg by mouth 3 (three) times daily as needed for shortness of  breath.   aspirin EC 81 MG tablet Take 81 mg by mouth every evening.   cetirizine 10 MG tablet Commonly known as:  ZYRTEC Take 10 mg by mouth daily.   cyclobenzaprine 10 MG tablet Commonly known as:  FLEXERIL Take 10 mg by mouth 3 (three) times daily as needed for muscle spasms.   doxycycline 100 MG tablet Commonly known as:  VIBRA-TABS Take 1 tablet (100 mg total) by mouth 2 (two) times daily. What changed:  when to take this   HUMALOG KWIKPEN 100 UNIT/ML KiwkPen Generic drug:  insulin lispro Inject 10 Units into the skin See admin instructions. 10 units three times a day before meals per sliding scale   HYDROcodone-acetaminophen 10-325 MG tablet Commonly known as:  NORCO Take 1 tablet by mouth 2 (two) times daily as needed for severe pain.   LANTUS SOLOSTAR 100 UNIT/ML Solostar Pen Generic drug:  Insulin Glargine Inject 36 Units into the skin 2 (two) times daily. What changed:  Another medication with the same name was added. Make sure you understand how and when to take each. Notes to patient:  Patient reports Lantus 18 units BID is her correct dose   insulin glargine 100 UNIT/ML injection Commonly known as:  LANTUS Inject 0.32 mLs (32 Units total) into the skin 2 (two) times daily. What changed:  You were already taking a medication with the same name, and this prescription was added. Make sure you understand how and when to take each. Notes to patient:  Patient claims she does not take this type of insulin But the prescription is available if needed.   loperamide 2 MG tablet Commonly known as:  IMODIUM A-D Take 12 mg by mouth daily as needed for diarrhea or loose stools.   metoprolol tartrate 25 MG tablet Commonly known as:  LOPRESSOR Take 0.5 tablets (12.5 mg total) by mouth 2 (two) times daily.   omeprazole 20 MG capsule Commonly known as:  PRILOSEC Take 20 mg by mouth daily.   ondansetron 4 MG disintegrating tablet Commonly known as:  ZOFRAN ODT Take 1  tablet (4 mg total) by mouth every 8 (eight) hours as needed for nausea or vomiting.   pregabalin 100 MG capsule Commonly known as:  LYRICA Limit 1 tab by mouth twice a day to 3 times a day if tolerated What changed:    how much to take  how to take this  when to take this  additional instructions   rivaroxaban 20 MG Tabs tablet Commonly known as:  XARELTO Take 1 tablet (20 mg total) by mouth daily with supper.   rosuvastatin 20 MG tablet Commonly known as:  CRESTOR Take 1 tablet (20 mg total) by mouth daily at 6 PM.      Activity:  activity as tolerated Diet: regular diet Wound Care: keep wound clean and dry  Follow-up with Dr. Trula Slade in 2 weeks.  SignedDagoberto Ligas 04/02/2018 2:25 PM

## 2018-04-06 ENCOUNTER — Other Ambulatory Visit (HOSPITAL_COMMUNITY): Payer: Medicare Other

## 2018-04-06 ENCOUNTER — Ambulatory Visit: Payer: Medicare Other | Admitting: Surgery

## 2018-04-06 ENCOUNTER — Encounter (HOSPITAL_COMMUNITY): Payer: Medicare Other

## 2018-04-08 ENCOUNTER — Other Ambulatory Visit: Payer: Self-pay | Admitting: Physician Assistant

## 2018-04-08 ENCOUNTER — Telehealth: Payer: Self-pay | Admitting: *Deleted

## 2018-04-08 MED ORDER — RIVAROXABAN 20 MG PO TABS: 20.0000 mg | ORAL_TABLET | Freq: Every day | ORAL | 2 refills | Status: DC

## 2018-04-08 NOTE — Telephone Encounter (Signed)
[name], nurse with [facility] called stating that patient did not have xarelto "in her home".  Patient explained to [name] that her pharmacy said the medication had been picked up already.  Laurence Slate, PA  Had discharged patient from hospital and had listed for patient to continue Xarelto.  Terrence Dupont entered the medication to Epic today and patient and [name] were notified.

## 2018-04-10 ENCOUNTER — Encounter: Payer: Medicare Other | Attending: Physician Assistant | Admitting: Physician Assistant

## 2018-04-10 DIAGNOSIS — L97429 Non-pressure chronic ulcer of left heel and midfoot with unspecified severity: Secondary | ICD-10-CM | POA: Diagnosis not present

## 2018-04-10 DIAGNOSIS — I2510 Atherosclerotic heart disease of native coronary artery without angina pectoris: Secondary | ICD-10-CM | POA: Insufficient documentation

## 2018-04-10 DIAGNOSIS — F17210 Nicotine dependence, cigarettes, uncomplicated: Secondary | ICD-10-CM | POA: Insufficient documentation

## 2018-04-10 DIAGNOSIS — L98419 Non-pressure chronic ulcer of buttock with unspecified severity: Secondary | ICD-10-CM | POA: Insufficient documentation

## 2018-04-10 DIAGNOSIS — E1140 Type 2 diabetes mellitus with diabetic neuropathy, unspecified: Secondary | ICD-10-CM | POA: Diagnosis not present

## 2018-04-10 DIAGNOSIS — E1152 Type 2 diabetes mellitus with diabetic peripheral angiopathy with gangrene: Secondary | ICD-10-CM | POA: Insufficient documentation

## 2018-04-10 DIAGNOSIS — Z794 Long term (current) use of insulin: Secondary | ICD-10-CM | POA: Diagnosis not present

## 2018-04-10 DIAGNOSIS — L97529 Non-pressure chronic ulcer of other part of left foot with unspecified severity: Secondary | ICD-10-CM | POA: Diagnosis present

## 2018-04-10 DIAGNOSIS — H548 Legal blindness, as defined in USA: Secondary | ICD-10-CM | POA: Insufficient documentation

## 2018-04-10 DIAGNOSIS — I70262 Atherosclerosis of native arteries of extremities with gangrene, left leg: Secondary | ICD-10-CM | POA: Insufficient documentation

## 2018-04-12 NOTE — Progress Notes (Addendum)
[name], [name] ([id number]) Visit Report for 04/10/2018 Arrival Information Details Patient Name: [name], [name]. Date of Service: 04/10/2018 1:45 PM Medical Record Number: [id number] Patient Account Number: [id number] Date of Birth/Sex: 02/16/1966 (52 y.o. F) Treating RN: [name] Primary Care [name]: [name] Other Clinician: Referring [name]: [name] Treating [name]/Extender: [name], [name] in Treatment: 16 Visit Information History Since Last Visit All ordered tests and consults were completed: No Patient Arrived: Wheel Chair Added or deleted any medications: Yes Arrival Time: 13:55 Any new allergies or adverse reactions: No Transfer Assistance: None Had a fall or experienced change in No Patient Requires Transmission-Based No activities of daily living that may affect Precautions: risk of falls: Patient Has Alerts: Yes Signs or symptoms of abuse/neglect since last visito No Patient Alerts: Patient on Blood Hospitalized since last visit: Yes Thinner DM II Implantable device outside of the clinic excluding No Xarelto cellular tissue based products placed in the center since last visit: Has Dressing in Place as Prescribed: Yes Pain Present Now: Yes Electronic Signature([name]) Signed: 04/10/2018 4:56:16 PM By: [name] Entered By: [name] on 04/10/2018 13:57:24 [name], [name] ([id number]) -------------------------------------------------------------------------------- Clinic Level of Care Assessment Details Patient Name: [name], [name]. Date of Service: 04/10/2018 1:45 PM Medical Record Number: [id number] Patient Account Number: [id number] Date of Birth/Sex: Sep 17, 1966 (52 y.o. F) Treating RN: [name] Primary Care [name]: [name] Other Clinician: Referring [name]: [name] Treating [name]/Extender: [name], [name] in Treatment: 16 Clinic Level of Care Assessment Items TOOL 4 Quantity Score []  - Use  when only an EandM is performed on FOLLOW-UP visit 0 ASSESSMENTS - Nursing Assessment / Reassessment X - Reassessment of Co-morbidities (includes updates in patient status) 1 10 X- 1 5 Reassessment of Adherence to Treatment Plan ASSESSMENTS - Wound and Skin Assessment / Reassessment []  - Simple Wound Assessment / Reassessment - one wound 0 X- 6 5 Complex Wound Assessment / Reassessment - multiple wounds []  - 0 Dermatologic / Skin Assessment (not related to wound area) ASSESSMENTS - Focused Assessment []  - Circumferential Edema Measurements - multi extremities 0 []  - 0 Nutritional Assessment / Counseling / Intervention X- 1 5 Lower Extremity Assessment (monofilament, tuning fork, pulses) []  - 0 Peripheral Arterial Disease Assessment (using hand held doppler) ASSESSMENTS - Ostomy and/or Continence Assessment and Care []  - Incontinence Assessment and Management 0 []  - 0 Ostomy Care Assessment and Management (repouching, etc.) PROCESS - Coordination of Care X - Simple Patient / Family Education for ongoing care 1 15 []  - 0 Complex (extensive) Patient / Family Education for ongoing care []  - 0 Staff obtains Programmer, systems, Records, Test Results / Process Orders []  - 0 Staff telephones HHA, Nursing Homes / Clarify orders / etc []  - 0 Routine Transfer to another Facility (non-emergent condition) []  - 0 Routine Hospital Admission (non-emergent condition) []  - 0 New Admissions / Biomedical engineer / Ordering NPWT, Apligraf, etc. []  - 0 Emergency Hospital Admission (emergent condition) X- 1 10 Simple Discharge Coordination [name], [name]. ([id number]) []  - 0 Complex (extensive) Discharge Coordination PROCESS - Special Needs []  - Pediatric / Minor Patient Management 0 []  - 0 Isolation Patient Management []  - 0 Hearing / Language / Visual special needs []  - 0 Assessment of Community assistance (transportation, D/C planning, etc.) []  - 0 Additional assistance / Altered  mentation []  - 0 Support Surface([name]) Assessment (bed, cushion, seat, etc.) INTERVENTIONS - Wound Cleansing / Measurement []  - Simple Wound Cleansing - one wound 0 X-  6 5 Complex Wound Cleansing - multiple wounds X- 1 5 Wound Imaging (photographs - any number of wounds) []  - 0 Wound Tracing (instead of photographs) []  - 0 Simple Wound Measurement - one wound X- 6 5 Complex Wound Measurement - multiple wounds INTERVENTIONS - Wound Dressings X - Small Wound Dressing one or multiple wounds 6 10 []  - 0 Medium Wound Dressing one or multiple wounds []  - 0 Large Wound Dressing one or multiple wounds []  - 0 Application of Medications - topical []  - 0 Application of Medications - injection INTERVENTIONS - Miscellaneous []  - External ear exam 0 []  - 0 Specimen Collection (cultures, biopsies, blood, body fluids, etc.) []  - 0 Specimen([name]) / Culture([name]) sent or taken to Lab for analysis []  - 0 Patient Transfer (multiple staff / Civil Service fast streamer / Similar devices) []  - 0 Simple Staple / Suture removal (25 or less) []  - 0 Complex Staple / Suture removal (26 or more) []  - 0 Hypo / Hyperglycemic Management (close monitor of Blood Glucose) []  - 0 Ankle / Brachial Index (ABI) - do not check if billed separately X- 1 5 Vital Signs [name], [name]. ([id number]) Has the patient been seen at the hospital within the last three years: Yes Total Score: 205 Level Of Care: New/Established - Level 5 Electronic Signature([name]) Signed: 04/10/2018 5:24:01 PM By: [name] Entered By: [name] on 04/10/2018 14:56:28 [name] ([id number]) -------------------------------------------------------------------------------- Encounter Discharge Information Details Patient Name: [name]. Date of Service: 04/10/2018 1:45 PM Medical Record Number: [id number] Patient Account Number: [id number] Date of Birth/Sex: 1966/04/15 (52 y.o. F) Treating RN: [name] Primary Care Santiana Glidden: [name]  Other Clinician: Referring [name]: [name] Treating [name]/Extender: [name], [name] in Treatment: 16 Encounter Discharge Information Items Discharge Condition: Stable Ambulatory Status: Wheelchair Discharge Destination: Home Transportation: Private Auto Accompanied By: self Schedule Follow-up Appointment: Yes Clinical Summary of Care: Electronic Signature([name]) Signed: 04/10/2018 5:19:19 PM By: Gretta Cool, [name], RN, CWS, [name], [name] Entered By: Gretta Cool, [name], RN, CWS, Kim on 04/10/2018 17:19:19 Schoenfeldt, [name] ([id number]) -------------------------------------------------------------------------------- Lower Extremity Assessment Details Patient Name: RAYAH, FINES. Date of Service: 04/10/2018 1:45 PM Medical Record Number: [id number] Patient Account Number: [id number] Date of Birth/Sex: 15-Jun-1966 (52 y.o. F) Treating RN: [name] Primary Care Sareena Odeh: [name] Other Clinician: Referring Viana Sleep: [name] Treating Jeidi Gilles/Extender: STONE III, [name] in Treatment: 16 Vascular Assessment Pulses: Dorsalis Pedis Palpable: [Left:Yes] [Right:Yes] Posterior Tibial Extremity colors, hair growth, and conditions: Extremity Color: [Left:Red] [Right:Normal] Hair Growth on Extremity: [Left:No] [Right:No] Temperature of Extremity: [Left:Warm] [Right:Warm] Capillary Refill: [Left:< 3 seconds] [Right:< 3 seconds] Toe Nail Assessment Left: Right: Thick: Yes Yes Discolored: Yes Yes Deformed: Yes Yes Improper Length and Hygiene: Yes Yes Electronic Signature([name]) Signed: 04/10/2018 5:24:01 PM By: [name] Entered By: [name] on 04/10/2018 14:48:23 [name], [name]. ([id number]) -------------------------------------------------------------------------------- Multi Wound Chart Details Patient Name: [name]. Date of Service: 04/10/2018 1:45 PM Medical Record Number: [id number] Patient Account Number: [id number] Date of Birth/Sex: Sep 27, 1966 (52 y.o.  F) Treating RN: [name] Primary Care Kenza Munar: [name] Other Clinician: Referring Ausha Sieh: [name] Treating Kasandra Fehr/Extender: STONE III, [name] in Treatment: 16 Vital Signs Height(in): 67 Pulse(bpm): 83 Weight(lbs): Blood Pressure(mmHg): 81/49 Body Mass Index(BMI): Temperature(F): 98.3 Respiratory Rate 16 (breaths/min): Photos: [10:No Photos] [12:No Photos] [3:No Photos] Wound Location: [10:Left, Circumferential Toe Fifth] [12:Left Calcaneus] [3:Right, Lateral Malleolus] Wounding Event: [10:Gradually Appeared] [12:Gradually Appeared] [3:Pressure Injury] 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] [12:Diabetic Wound/Ulcer  of the Lower Extremity] [3:Diabetic Wound/Ulcer of the Lower Extremity] Comorbid History: [10:N/A] [12:Anemia, Arrhythmia, Coronary Artery Disease, Hypotension, Peripheral Arterial Disease, Type II Diabetes, Osteoarthritis] [3:N/A] Date Acquired: [10:11/27/2017] [12:03/20/2018] [3:09/23/2016] [name] of Treatment: [10:14] [12:0] [3:16] Wound Status: [10:Open] [12:Open] [3:Healed - Epithelialized] Pending Amputation on [10:No] [12:No] [3:No] Presentation: Measurements L x W x D [10:6.1x3x0.1] [12:9.5x7.5x0.2] [3:0x0x0] (cm) Area (cm) : [10:14.373] [12:55.96] [3:0] Volume (cm) : [10:1.437] [12:11.192] [3:0] % Reduction in Area: [10:-52.50%] [12:N/A] [3:100.00%] % Reduction in Volume: [10:-52.50%] [12:N/A] [3:100.00%] Classification: [10:Grade 1] [12:Grade 4] [3:Grade 2] Exudate Amount: [10:N/A] [12:Medium] [3:N/A] Exudate Type: [10:N/A] [12:Serous] [3:N/A] Exudate Color: [10:N/A] [12:amber] [3:N/A] Wound Margin: [10:N/A] [12:Indistinct, nonvisible] [3:N/A] Epithelialization: [10:N/A] [12:None] [3:N/A] Periwound Skin Texture: [10:No Abnormalities Noted] [12:No Abnormalities Noted] [3:No Abnormalities Noted] Periwound Skin Moisture: [10:No Abnormalities Noted] [12:No Abnormalities Noted] [3:No Abnormalities  Noted] Periwound Skin Color: [10:No Abnormalities Noted] [12:No Abnormalities Noted] [3:No Abnormalities Noted] Tenderness on Palpation: [10:No] [12:No] [3:No] Wound Preparation: [10:N/A] [12:Ulcer Cleansing: Rinsed/Irrigated with Saline] [3:N/A] Wound Number: 5 6 7  Photos: No Photos No Photos No Photos Wound Location: Left, Circumferential Toe Great Right Gluteus Abernathy, Kathye [name]. 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 Extremity Comorbid History: N/A N/A N/A Date Acquired: 11/27/2017 11/27/2017 09/18/2016 [name] of Treatment: 16 16 16  Wound Status: Open Open Open Pending Amputation on Yes Yes No Presentation: Measurements L x W x D 8x4.5x0.1 5.5x1.5x0.1 1.4x1x0.3 (cm) Area (cm) : 28.274 6.48 1.1 Volume (cm) : 2.827 0.648 0.33 % Reduction in Area: 5.90% -37.50% 26.30% % Reduction in Volume: 5.90% -37.60% 68.40% Classification: Grade 2 Grade 2 Category/Stage IV Exudate Amount: N/A N/A N/A Exudate Type: N/A N/A N/A Exudate Color: N/A N/A N/A Wound Margin: N/A N/A N/A Epithelialization: N/A N/A N/A Periwound Skin Texture: No Abnormalities Noted No Abnormalities Noted No Abnormalities Noted Periwound Skin Moisture: No Abnormalities Noted No Abnormalities Noted No Abnormalities Noted Periwound Skin Color: No Abnormalities Noted No Abnormalities Noted No Abnormalities Noted Tenderness on Palpation: No No No Wound Preparation: N/A N/A N/A Wound Number: 8 N/A N/A Photos: No Photos N/A N/A Wound Location: Left, Lateral Foot N/A N/A Wounding Event: Gradually Appeared N/A N/A Primary Etiology: Diabetic Wound/Ulcer of the N/A N/A Lower Extremity Comorbid History: N/A N/A N/A Date Acquired: 11/27/2017 N/A N/A [name] of Treatment: 16 N/A N/A Wound Status: Open N/A N/A Pending Amputation on Yes N/A  N/A Presentation: Measurements L x W x D 6.5x3.5x0.2 N/A N/A (cm) Area (cm) : 17.868 N/A N/A Volume (cm) : 3.574 N/A N/A % Reduction in Area: 82.20% N/A N/A % Reduction in Volume: 64.40% N/A N/A Classification: Grade 2 N/A N/A Exudate Amount: N/A N/A N/A Exudate Type: N/A N/A N/A Exudate Color: N/A N/A N/A Wound Margin: N/A N/A N/A Epithelialization: N/A N/A N/A Periwound Skin Texture: No Abnormalities Noted N/A N/A Periwound Skin Moisture: No Abnormalities Noted N/A N/A Periwound Skin Color: No Abnormalities Noted N/A N/A Braver, Tywanna [name]. ([id number]) Tenderness on Palpation: No N/A N/A Wound Preparation: N/A N/A N/A Treatment Notes Electronic Signature([name]) Signed: 04/10/2018 5:24:01 PM By: [name] Entered By: [name] on 04/10/2018 14:48:44 [name], [name] ([id number]) -------------------------------------------------------------------------------- Emmett Details Patient Name: [name], [name]. Date of Service: 04/10/2018 1:45 PM Medical Record Number: [id number] Patient Account Number: [id number] Date of Birth/Sex: May 14, 1966 (52 y.o. F) Treating RN: [name] Primary Care Vermelle Cammarata: [name] Other Clinician: Referring Dailyn Reith: [name]  Treating Montrice Gracey/Extender: [name], [name] in Treatment: 16 Active Inactive Electronic Signature([name]) Signed: 05/05/2018 11:31:40 AM By: Gretta Cool, [name], RN, CWS, [name], [name] Signed: 05/07/2018 10:40:13 AM By: [name] Previous Signature: 04/10/2018 5:24:01 PM Version By: [name] Entered By: Gretta Cool [name], RN, CWS, Kim on 05/05/2018 11:31:37 [name], [name] ([id number]) -------------------------------------------------------------------------------- Pain Assessment Details Patient Name: [name], CAROLLO. Date of Service: 04/10/2018 1:45 PM Medical Record Number: [id number] Patient Account Number: [id number] Date of Birth/Sex: October 30, 1966 (52 y.o. F) Treating RN: [name] Primary Care Miller Limehouse: [name] Other Clinician: Referring Braelynn Benning: [name] Treating Kavan Devan/Extender: STONE III, [name] in Treatment: 16 Active Problems Location of Pain Severity and Description of Pain Patient Has Paino Yes Site Locations Pain Location: Generalized Pain Pain Management and Medication Current Pain Management: Electronic Signature([name]) Signed: 04/10/2018 4:56:16 PM By: [name] Entered By: [name] on 04/10/2018 13:58:25 Mainor, [name] ([id number]) -------------------------------------------------------------------------------- Patient/Caregiver Education Details Patient Name: [name]. Date of Service: 04/10/2018 1:45 PM Medical Record Number: [id number] Patient Account Number: [id number] Date of Birth/Gender: 1966-10-28 (52 y.o. F) Treating RN: [name] Primary Care Physician: [name] Other Clinician: Referring Physician: Delight [name] Treating Physician/Extender: Sharalyn Ink in Treatment: 16 Education Assessment Education Provided To: Patient Education Topics Provided Wound/Skin Impairment: Handouts: Caring for Your Ulcer Methods: Demonstration, Explain/Verbal Responses: State content correctly Electronic Signature([name]) Signed: 04/10/2018 6:21:51 PM By: Gretta Cool, [name], RN, CWS, [name], [name] Entered By: Gretta Cool, [name], RN, CWS, Kim on 04/10/2018 17:19:40 Stoltzfus, [name] ([id number]) -------------------------------------------------------------------------------- Wound Assessment Details Patient Name: AILA, TERRA. Date of Service: 04/10/2018 1:45 PM Medical Record Number: [id number] Patient Account Number: [id number] Date of Birth/Sex: 1966-08-18 (52 y.o. F) Treating RN: [name] Primary Care Naliah Eddington: [name] Other Clinician: Referring Sruti Ayllon: [name] Treating Kenyan Karnes/Extender: STONE III, [name] in Treatment: 16 Wound Status Wound Number: 10 Primary Diabetic Wound/Ulcer of the  Lower Etiology: Extremity Wound Location: Left, Circumferential Toe Fifth Wound Status: Open Wounding Event: Gradually Appeared Date Acquired: 11/27/2017 [name] Of Treatment: 14 Clustered Wound: No Photos Photo Uploaded By: [name] on 04/10/2018 17:24:46 Wound Measurements Length: (cm) 6.1 Width: (cm) 3 Depth: (cm) 0.1 Area: (cm) 14.373 Volume: (cm) 1.437 % Reduction in Area: -52.5% % Reduction in Volume: -52.5% Wound Description Classification: Grade 1 Periwound Skin Texture Texture Color No Abnormalities Noted: No No Abnormalities Noted: No Moisture No Abnormalities Noted: No Electronic Signature([name]) Signed: 04/10/2018 4:56:16 PM By: [name] Entered By: [name] on 04/10/2018 14:08:43 Younes, Barri [name]. ([id number]) -------------------------------------------------------------------------------- Wound Assessment Details Patient Name: [name]. Date of Service: 04/10/2018 1:45 PM Medical Record Number: [id number] Patient Account Number: [id number] Date of Birth/Sex: [birth year] (52 y.o. F) Treating RN: [name] Primary Care Audria Takeshita: [name] Other Clinician: Referring [place] Pettengill: [name] Treating Rosene Pilling/Extender: STONE III, [name] in Treatment: 16 Wound Status Wound Number: 12 Primary Diabetic Wound/Ulcer of the Lower Extremity Etiology: Wound Location: Left Calcaneus Wound Open Wounding Event: Gradually Appeared Status: Date Acquired: 03/20/2018 Comorbid Anemia, Arrhythmia, Coronary Artery Disease, [name] Of Treatment: 0 History: Hypotension, Peripheral Arterial Disease, Type Clustered Wound: No II Diabetes, Osteoarthritis Photos Photo Uploaded By: [name] on 04/10/2018 17:24:47 Wound Measurements Length: (cm) 9.5 Width: (cm) 7.5 Depth: (cm) 0.2 Area: (cm) 55.96 Volume: (cm) 11.192 % Reduction in Area: % Reduction in Volume: Epithelialization: None Tunneling: No Undermining: No Wound  Description Classification: Grade 4 Wound Margin: Indistinct, nonvisible Exudate Amount: Medium Exudate Type: Serous Exudate Color: amber Foul Odor After Cleansing: No Slough/Fibrino  Yes Periwound Skin Texture Texture Color No Abnormalities Noted: No No Abnormalities Noted: No Moisture No Abnormalities Noted: No Wound Preparation Ulcer Cleansing: Rinsed/Irrigated with Saline Lewey, Kallan [name]. ([id number]) Electronic Signature([name]) Signed: 04/10/2018 4:56:16 PM By: [name] Entered By: [name] on 04/10/2018 14:15:51 Mizer, Lometa [name]. ([id number]) -------------------------------------------------------------------------------- Wound Assessment Details Patient Name: [name]. Date of Service: 04/10/2018 1:45 PM Medical Record Number: [id number] Patient Account Number: [id number] Date of Birth/Sex: 1966/08/12 (52 y.o. F) Treating RN: [name] Primary Care Moreen Piggott: [name] Other Clinician: Referring Nikki Glanzer: [name] Treating Jamiria Langill/Extender: STONE III, [name] in Treatment: 16 Wound Status Wound Number: 3 Primary Diabetic Wound/Ulcer of the Lower Etiology: Extremity Wound Location: Right, Lateral Malleolus Wound Status: Healed - Epithelialized Wounding Event: Pressure Injury Date Acquired: 09/23/2016 [name] Of Treatment: 16 Clustered Wound: No Wound Measurements Length: (cm) 0 % Width: (cm) 0 % Depth: (cm) 0 Area: (cm) 0 Volume: (cm) 0 Reduction in Area: 100% Reduction in Volume: 100% Wound Description Classification: Grade 2 Periwound Skin Texture Texture Color No Abnormalities Noted: No No Abnormalities Noted: No Moisture No Abnormalities Noted: No Electronic Signature([name]) Signed: 04/10/2018 4:56:16 PM By: [name] Entered By: [name] on 04/10/2018 14:05:53 Tobler, Mechelle [name]. ([id number]) -------------------------------------------------------------------------------- Wound Assessment Details Patient  Name: [name]. Date of Service: 04/10/2018 1:45 PM Medical Record Number: [id number] Patient Account Number: [id number] Date of Birth/Sex: May 22, 1966 (52 y.o. F) Treating RN: [name] Primary Care Laurisa Sahakian: [name] Other Clinician: Referring Ervin Hensley: [name] Treating Siddhartha Hoback/Extender: STONE III, [name] in Treatment: 16 Wound Status Wound Number: 5 Primary Diabetic Wound/Ulcer of the Lower Etiology: Extremity Wound Location: Left, Circumferential Toe Great Wound Status: Open Wounding Event: Gradually Appeared Date Acquired: 11/27/2017 [name] Of Treatment: 16 Clustered Wound: No Pending Amputation On Presentation Photos Photo Uploaded By: [name] on 04/10/2018 17:26:04 Wound Measurements Length: (cm) 8 Width: (cm) 4.5 Depth: (cm) 0.1 Area: (cm) 28.274 Volume: (cm) 2.827 % Reduction in Area: 5.9% % Reduction in Volume: 5.9% Wound Description Classification: Grade 2 Periwound Skin Texture Texture Color No Abnormalities Noted: No No Abnormalities Noted: No Moisture No Abnormalities Noted: No Electronic Signature([name]) Signed: 04/10/2018 4:56:16 PM By: [name] Entered By: [name] on 04/10/2018 14:08:17 Valent, Aulani [name]. ([id number]) -------------------------------------------------------------------------------- Wound Assessment Details Patient Name: [name]. Date of Service: 04/10/2018 1:45 PM Medical Record Number: [id number] Patient Account Number: [id number] Date of Birth/Sex: 03-29-1966 (52 y.o. F) Treating RN: [name] Primary Care Krisa Blattner: [name] Other Clinician: Referring Maanasa Aderhold: [name] Treating Bryttney Netzer/Extender: STONE III, [name] in Treatment: 16 Wound Status Wound Number: 6 Primary Diabetic Wound/Ulcer of the Lower Etiology: Extremity Wound Location: Left, Circumferential Toe Fourth Wound Status: Open Wounding Event: Gradually Appeared Date Acquired: 11/27/2017 [name] Of  Treatment: 16 Clustered Wound: No Pending Amputation On Presentation Photos Photo Uploaded By: [name] on 04/10/2018 17:27:01 Wound Measurements Length: (cm) 5.5 Width: (cm) 1.5 Depth: (cm) 0.1 Area: (cm) 6.48 Volume: (cm) 0.648 % Reduction in Area: -37.5% % Reduction in Volume: -37.6% Wound Description Classification: Grade 2 Periwound Skin Texture Texture Color No Abnormalities Noted: No No Abnormalities Noted: No Moisture No Abnormalities Noted: No Electronic Signature([name]) Signed: 04/10/2018 4:56:16 PM By: [name] Entered By: [name] on 04/10/2018 14:08:17 Qualls, Reilley [name]. ([id number]) -------------------------------------------------------------------------------- Wound Assessment Details Patient Name: [name]. Date of Service: 04/10/2018 1:45 PM Medical Record Number: [id number] Patient Account Number: [id number] Date of Birth/Sex: 11-20-1965 (52 y.o. F) Treating RN: [name] Primary Care Briell Paulette: [name] Other Clinician:  Referring Issaih Kaus: Severa, LINDA Treating Travontae Freiberger/Extender: STONE III, [name] in Treatment: 16 Wound Status Wound Number: 7 Primary Etiology: Pressure Ulcer Wound Location: Right Gluteus Wound Status: Open Wounding Event: Gradually Appeared Date Acquired: 09/18/2016 [name] Of Treatment: 16 Clustered Wound: No Wound Measurements Length: (cm) 1.4 Width: (cm) 1 Depth: (cm) 0.3 Area: (cm) 1.1 Volume: (cm) 0.33 % Reduction in Area: 26.3% % Reduction in Volume: 68.4% Wound Description Classification: Category/Stage IV Periwound Skin Texture Texture Color No Abnormalities Noted: No No Abnormalities Noted: No Moisture No Abnormalities Noted: No Electronic Signature([name]) Signed: 04/10/2018 4:56:16 PM By: [name] Entered By: [name] on 04/10/2018 14:17:51 Adduci, Royelle [name]. ([id number]) -------------------------------------------------------------------------------- Wound  Assessment Details Patient Name: [name]. Date of Service: 04/10/2018 1:45 PM Medical Record Number: [id number] Patient Account Number: [id number] Date of Birth/Sex: Nov 05, 1966 (52 y.o. 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 Location: Left, Lateral Foot Wound Status: Open Wounding Event: Gradually Appeared Date Acquired: 11/27/2017 [name] Of Treatment: 16 Clustered Wound: No Pending Amputation On Presentation Photos Photo Uploaded By: [name] on 04/10/2018 17:28:44 Wound Measurements Length: (cm) 6.5 Width: (cm) 3.5 Depth: (cm) 0.2 Area: (cm) 17.868 Volume: (cm) 3.574 % Reduction in Area: 82.2% % Reduction in Volume: 64.4% Wound Description Classification: Grade 2 Periwound Skin Texture Texture Color No Abnormalities Noted: No No Abnormalities Noted: No Moisture No Abnormalities Noted: No Electronic Signature([name]) Signed: 04/10/2018 4:56:16 PM By: [name] Entered By: [name] on 04/10/2018 14:09:25 Cifuentes, Rashanda [name]. ([id number]) -------------------------------------------------------------------------------- Vitals Details Patient Name: [name]. Date of Service: 04/10/2018 1:45 PM Medical Record Number: [id number] Patient Account Number: [id number] Date of Birth/Sex: 11-Feb-1966 (52 y.o. F) Treating RN: [name] Primary Care Clark Cuff: [name] Other Clinician: Referring Kaushik Maul: [name] Treating Emre Stock/Extender: STONE III, [name] in Treatment: 16 Vital Signs Time Taken: 13:59 Temperature (F): 98.3 Height (in): 67 Pulse (bpm): 83 Respiratory Rate (breaths/min): 16 Blood Pressure (mmHg): 81/49 Reference Range: 80 - 120 mg / dl Electronic Signature([name]) Signed:  04/10/2018 4:56:16 PM By: [name] Entered By: [name] on 04/10/2018 13:59:35

## 2018-04-15 NOTE — Progress Notes (Signed)
Jamie, [name] ([id number]) Visit Report for 04/10/2018 Chief Complaint Document Details Patient Name: [name], [name]. Date of Service: 04/10/2018 1:45 PM Medical Record Number: [id number] Patient Account Number: [id number] Date of Birth/Sex: Jun 21, 1966 (52 y.o. F) Treating RN: [name] Primary Care Provider: Delight [name] Other Clinician: Referring Provider: Delight [name] Treating Provider/Extender: [name], [name] Weeks in Treatment: 16 Information Obtained from: Patient Chief Complaint She is here in follow up evaluation for multiple wounds Electronic Signature(s) Signed: 04/10/2018 9:42:33 PM By: Jamie Keeler PA-C Entered By: [name] on 04/10/2018 14:08:41 [name], [name] ([id number]) -------------------------------------------------------------------------------- HPI Details Patient Name: [name]. Date of Service: 04/10/2018 1:45 PM Medical Record Number: [id number] Patient Account Number: [id number] Date of Birth/Sex: [birth year] (52 y.o. F) Treating RN: [name] Primary Care Provider: Delight [name] Other Clinician: Referring Provider: Delight [name] Treating Provider/Extender: [name], [name] Weeks in Treatment: 16 History of Present Illness HPI Description: 52 year old patient was sent to [place] from [facility] where she was seen by Dr. Sherril [name] for a left ankle ulceration and was recently hospitalized with hypotension and sepsis. She was treated with IV antibiotics and has been scheduled to see Dr. Sharol [name]. She was seen by vascular surgery who recommended a femoral bypass but surgery has been delayed until [name] sacral wound from last year's necrotizing fasciitis has healed. Was seeing the wound care team at [facility] but wanted to change over. She is a smoker and smokes a pack of cigarettes a day The patient was recently admitted in [place] between February 2 and February 14. She had a follow-up to see vascular surgery, orthopedic surgery and  infectious disease. During [name] admission she was known to have peripheral arterial disease, diabetes mellitus with neuropathy, chronic pain, open wound with necrotizing fasciitis of the sacral area which had been there since November 2017 Past medical history significant for diabetes mellitus, ankle ulcer, sacral ulcer, necrotizing fasciitis, arterial occlusive disease, tobacco abuse. Review of the electronic medical records reveals that Dr. 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 An MRI showed edema in the bone which was consistent with osteomyelitis and the chronic ulcer and may need surgical intervention. She also had a sacral decubitus ulcer which was treated with the wound VAC. On 01/07/2017 she was taken up by the vascular surgeon Dr. Trula [name] for an abdominal aortogram and bilateral lower extremity runoff, for a history of having bilateral femoropopliteal bypass graft as well as external iliac stenting on the right and stenting of [name] bypass graft. She had developed a nonhealing wound on [name] right ankle and there was a possibility of a femoral occlusion. the findings were noted and the impression was a surgical revascularization with a aorto bifemoral bypass graft. Both the femoropopliteal bypass grafts were patent. 01/31/2017 -- x-ray of the right hip and pelvis -- IMPRESSION:No radiographic evidence of acute osteomyelitis. Normal-appearing right hip joint space for age. No acute bony abnormality of the hip. 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 Since seeing [name] last week she has not had an appointment with infectious disease or the orthopedic specialist yet. 02/06/2017 -- the patient missed a couple of appointments yesterday due to the weather but other than that has apparently [name] up smoking for the last 5  days. 02/13/2017 -- she has rescheduled [name] infectious disease  appointment and also the appointment with the orthopedic surgeon Dr. Sharol [name] 03/06/2017 -- she was seen by Dr. Lucianne Lei [name] of infectious disease on 03/05/2017 and he recommendedIV antibiotics but the patient did not want to have that and he has [name] [name] Augmentin and doxycycline and will reevaluate [name] in 2 months time. 03/13/2017 -- he saw Dr. Trula [name] regarding [name] vascular issues and he would like to wait to the sacral wound is completely closed before considering aortobifemoral bypass graft. He will see [name] back in 2 months time. She was also seen by Dr. Sharol [name] of orthopedics who recommended continue wound care dressings and follow in the office in about 2 months time. he also recommended 3 view radiographs of the right ankle at follow-up. 03/28/2017 -- she did have a PICC line placed and Dr. Lucianne Lei [name] has begun IV vancomycin and ceftriaxone. she is going to continue this until she sees him in approximately a month's time 04/18/2017 -- we had applied for a skin substitute for the patient's care but [name] copayment is going to be about $300 a piece and the patient will not be able to afford this. 05/08/17 on evaluation today patient appears to potentially have a fungal rash in the periwound region of the sacral area. This also seems to extend into the inguinal creases and factional region as well. She is tender to palpation with light rubbing over the region of the periwound and the skin in this region does have a beefy red appearance. Everything seems to point to a fungal infection at this time. She has been tolerating the dressing changes up to this point. There is no evidence of infection otherwise. 05/16/2017 -- was seen by [name] of infectious disease -- he saw for the chronic wound over [name] right ankle [name], [name]. ([id number]) with osteomyelitis of the fibula. He did not think that she is making progress and was not able  to examine [name] sacral decubitus ulcer as the patient would not allow it. She was switched to Bactrim DS 2 tablets twice a day since finishing [name] antibiotics. [name] ESR was 76 and a CRP was 2.8. He again discussed with [name] that she would need amputation to cure [name] osteomyelitis of fibula but he would continue to try suppressive antibiotics. 05/23/17 on evaluation today patient's wound appears to be doing about the same in regard to the ankle as well as the gluteal area. She apparently has issues with the one back staying in place and she states the sill seems to break up the inferior aspect. She does have home help coming out to apply the wound VAC. She has been tolerating the dressing changes without any problem although she has had some increased pain in the gluteal region due to a fall she sustained and she feels like she brews that area and she had pain actually radiating down [name] leg which is slowly beginning to improve. There's no evidence of infection. 05/29/17 Unfortunately on evaluation today patient's wounds both in regard to the ankle as well as in regard to [name] gluteal region on the right appear to be measuring the same as last week's evaluation. She also tells me that she is having soreness today in regard to the right gluteal area because she had a visitor who came to see [name] a couple days ago and stayed for six hours and she had to set up for that entire length of time visiting. This has called some increased discomfort. Normally she does not  set up for those long periods of time. Fortunately there's no evidence of infection and no worsening of the wounds. 06/13/2017 -- the patient has had a lot of health issues including perfuse diarrhea and generalized weakness and I have instructed [name] to see [name] PCP as soon as possible and if things get worse to go to the ER. She has not brought [name] wound VAC today. She continues to be on oral antibiotics 06/30/17- she is here in follow up evaluation  for right ischial and right lateral malleolus ulcer. She continues with negative pressure wound therapy to the ischial wound. She is currently on a diabetic therapy per infectious disease, although she does state that she inconsistently takes it. She was advised to take [name] antibiotic as prescribed 07/11/2017 -- she has been unable to use a wound VAC regularly because she loses this evening and it has not been possible for [name] to use it. 07/18/2017 -- she is much more comfortable and looks better without the wound VAC and at this stage I believe it will be better for [name] to return the machine and continue with local care. 08/07/2017 -- she is back up to 3 weeks due to a vacation and during this time [name] wound VAC was returned. She has been doing local dressings 08/15/17 on evaluation today patient appears to be doing about the same in regard to [name] ankle as well as right gluteal wound. She has been tolerating the dressing changes and tells me that it's mainly [name] gluteal wound that hurts the ankle is nontender to palpation. She rates this pain to be a one out of 10 at rest and with cleansing of the wound five out of 10 at least. No fevers, chills, nausea, or vomiting noted at this time. She does note that home health has told [name] to inquire about possibly using silver nitrate on the wound edges due to the road nature of the edges. 08/29/2017 -- -- I got a note which Dr. Lucianne Lei [name] sent to Dr. Sharol [name] regarding plain film showing osteomyelitis in the lateral malleolus consistent with osteomyelitis which was worrisome. He had recommended definitive surgery to fix this. He recommended to continue with Bactrim Right ankle x-ray -- IMPRESSION: Findings compatible with cellulitis of the lateral malleolar region. A small focus of bony destruction involving the tip of the lateral malleolus is worrisome for osteomyelitis. 09/05/2017 -- the patient was seen by Dr. Meridee Score yesterday who thought [name] wound looked  good and he would continue local care with silver alginate dressing and follow-up in 4 weeks. He did not recommend any surgical intervention of the ankle until there is good arterial flow and he would await Dr. Stephens Shire opinion regarding this. She was also seen by infectious disease Dr. Lucianne Lei [name], who was concerned about [name] elevated potassium and took [name] off Bactrim. he has recommended doxycycline. 09/26/2017 -- she is back after 3 weeks as she was out of town on vacation. She has put on some weight but other than that no change in [name] health 10/24/2017 -- she was seen by Dr. [name] -- after a thorough review he plans a angiographically via the left femoral approach to evaluate [name] left subclavian artery and address any stenosis at that time. He will also perform bilateral runoff to confirm that a right femoropopliteal as well as a left femoropopliteal bypass graft remained patent. He is then considering a right axillary to femoral bypass graft for limb salvage. ADALEEN, [name] ([id number]) ==== 12/18/17-she  is here in follow-up evaluation for multiple wounds. She is s/p aortobifemoral and left fem-pop revision. She presents with multiple necrotic areas to the left toes and lateral foot. The wounds we were originally seen [name] for (right heel and left buttock) are improved/stable. According to [name] husband the wounds to the left foot are "better" than they were upon discharge from the hospital last Tuesday. He states that Dr. Trula [name] saw [name] on same day of discharge. She has a follow-up with Dr. Trula [name] on Monday. There are dopplerable pulses to the left dorsalis pedis, posterior tibial, peroneal. We communicated with Dr. Stephens Shire nurse regarding today's findings, photos were sent to [name] email and the patient will maintain [name] appointment on Monday. We discussed with the patient and [name] husband to report to [facility] in [place] for any concerns, changes, new areas of necrosis, new  areas or progressive ischemia/purple discoloration, pallor, new/increased pain. We will anticipate follow up next week 12/30/17 patient unfortunately continues to have multiple wound of [name] lower extremities and specifically [name] left toes and lateral foot. She does have regular follow-up visits with Dr. Trula [name] who performed [name] surgery and at this point they are just holding to wait and see what needs to be done in regard to [name] foot such as what's going to heal and what may need to have potential amputation of further surgery. For this reason we are just trying to maintain and see were things end up at this point. Fortunately patient is not having any significant discomfort. The right lateral malleolus appears to be doing about the same there is some granulation tissue she does have osteomyelitis and not ulcer. 01/06/18 patient presents today for fault evaluation concerning [name] ongoing issues with [name] right lateral ankle, right gluteal region, and left lower extremity. Currently regard to left lower extremity it's possible that she may require some amputation although patient states she has the appointment follow-up with Dr. Trula [name] next week. That's when she will find out what he thinks needs to be done at that point. Nonetheless in regard to the right lateral ankle this appears to be doing fairly well today. [name] right gluteal region actually appears to be doing fairly well with a good wound surface any slough that was present easily cleaned off with saline and gauze. 01/16/18 on evaluation today patient appears to be doing much better actually in regard to [name] left foot necrotic areas. A lot of this is starting to liftoff as far as the eschar is concerned and there is a lot of healing underneath which is excellent news. I'm actually pleasantly pleased with the results that she's getting in this regard. Fortunately there does not appear to be any evidence of infection which is great news. Overall she  has been tolerating the dressing changes without complication she does have one new ulcer on the right anterior shin due to a small skin tear which is actually healed but then the adhesive from a Band-Aid that was put on it at a doctor's office actually has 20 skin and she says while he has a small ulcer due to that. Fortunately this is superficial. 01/30/18 on evaluation today patient appears to be doing rather well in regard to [name] left lower extremity ulcerations as well as [name] right lateral malleolus and gluteal region. She has been tolerating the dressing changes in general without complication. She did see Dr. Trula [name] [name] vascular surgeon since I last saw [name] in the good news is he does not really feel  like he's gonna have to perform anything surgical in regard to deputation. With that being said I am still somewhat concerned about [name] left fifth toe we will have to see. Nonetheless overall I do believe she's making progress and [name] right lateral malleolus appears to have at the allies to over at this point the question will be if this stays covered over or not. 02/13/18 on evaluation today patient continues to do rather well in regard to [name] ulcerations. Everything seems to be doing better [name] right lateral ankle is still open but just barely. Overall I have been happy with the progress. Patient also is happy she states even before I saw it and said anything she Artie knew it was better just from what she can feel. 02/27/18 on evaluation today patient presents for follow-up concerning [name] left lower extremity ulcer. She has been tolerating the dressing changes without complication she performs a lot of these on [name] own although she's not able to really visualize the wound bed due to the fact that she is legally blind. She can see some things movement wise but not much. With that being said this really does not look to be as good as when I saw [name] two weeks ago on the 29th. We had been slowly  seeing the progress in regard to the eschar regions which were starting to lift up with the Betadine and doing great. Today I'm not seen as much of that and specifically around the great toe on the lateral aspect there appears to be some pus on the line and surrounding the eschar. She also has the same thing noted along with mottled skin in the region of the lateral fifth toe. Unfortunately these digits are also somewhat cyanotic at this point and cool to touch. [name] leg is erythematous from around mid calf down to the foot as well. All of this is new since last evaluation two weeks ago. Patient really does not have any discomfort and really notes nothing has changed since [name] last visit in that regard. She continues to perform a lot of the dressing changes herself at this time. Overall I have been extremely pleased with how things have gone up into this point since [name] surgery. She does see Dr. Trula [name] on a regular basis on his last check with [name] things were going well. 03/13/18 on evaluation today patient appears to be doing well in regard to [name] ischial wound. With that being said unfortunately [name] left lower extremity appears to have progressed even since I last evaluated [name]. She did go to the hospital where she had a CT angiogram performed. It did show as part of the study significantly that she had a left femoral popliteal bypass graft [name], [name] ([id number]) which is completely included. Arterial flow to left lower extremity is supplied to the deep femoral branches. On the right the revision of the right femoral popliteal bypass graph is still patent. Unfortunately visually [name] wounds specifically the necrotic regions of [name] left foot appear to be worse even compared to my last evaluation with [name]. Especially the great toe and the fifth toe. Left lower extremity foot ulcers. Specifically 04/10/18 on evaluation today patient presents for follow-up concerning [name] [name] left first toe, fourth  toe, and fifth toe are affected as well as the lateral portion of [name] foot. Since have last seen [name] she has actually undergone two procedures with vein and vascular. This included a lower extremity bypass with artificial vessels. She seemed to  tolerate the surgeries well according what she's telling me today with no complications and she seems to be doing much better at this point in time which is good news. [name] foot show signs of good perfusion. 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 Electronic Signature(s) Signed: 04/10/2018 9:42:33 PM By: Jamie Keeler PA-C Entered By: [name] on 04/10/2018 21:02:21 [name], [name] ([id number]) -------------------------------------------------------------------------------- Physical Exam Details Patient Name: [name], [name] Date of Service: 04/10/2018 1:45 PM Medical Record Number: [id number] Patient Account Number: [id number] Date of Birth/Sex: 09-19-1966 (52 y.o. F) Treating RN: [name] Primary Care Provider: Delight [name] Other Clinician: Referring Provider: Delight [name] Treating Provider/Extender: [name], [name] Weeks in Treatment: 16 Constitutional Chronically ill appearing but in no apparent acute distress. Respiratory normal breathing without difficulty. clear to auscultation bilaterally. Cardiovascular regular rate and rhythm with normal S1, S2. 1+ dorsalis pedis/posterior tibialis pulses. no clubbing, cyanosis, significant edema, <3 sec cap refill. Psychiatric this patient is able to make decisions and demonstrates good insight into disease process. Alert and Oriented x 3. pleasant and cooperative. Notes On evaluation today the patient appears to be doing well in regard to [name] foot from the standpoint of vascular status definitely much better than when I last visualized [name] foot. She seems  to have great blood flow. With that being said I even did detect a pulse although one plus it was still present which was also a change for the better. However she continues to have areas of gangrene including the entirety of the left first toe and portions of the left fourth toe and fifth toe. [name] sacral wound continues to do well at this point which is good news. Electronic Signature(s) Signed: 04/10/2018 9:42:33 PM By: Jamie Keeler PA-C Entered By: [name] on 04/10/2018 21:03:41 [name], [name] ([id number]) -------------------------------------------------------------------------------- Physician Orders Details Patient Name: [name], [name]. Date of Service: 04/10/2018 1:45 PM Medical Record Number: [id number] Patient Account Number: [id number] Date of Birth/Sex: [birth year] (52 y.o. 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 Wound #12 Left Calcaneus o Clean wound with Normal Saline. Wound #5 Left,Circumferential Toe Great o Clean wound with Normal Saline. Wound #6 Left,Circumferential Toe Fourth o Clean wound with Normal Saline. Wound #7 Right Gluteus o Clean wound with Normal Saline. Wound #8  Left,Lateral Foot o Clean wound with Normal Saline. Anesthetic (add to Medication List) Wound #7 Right Gluteus o Topical Lidocaine 4% cream applied to wound bed prior to debridement (In Clinic Only). Skin Barriers/Peri-Wound Care Wound #10 Left,Circumferential Toe Fifth o Skin Prep Wound #5 Left,Circumferential Toe Great o Skin Prep Wound #6 Left,Circumferential Toe Fourth o Skin Prep Wound #7 Right Gluteus [name], [name] ([id number]) o Skin Prep Wound #8 Left,Lateral Foot o Skin  Prep Primary Wound Dressing Wound #10 Left,Circumferential Toe Fifth o Other: - betadine or providone-iodine (paint on wound) Wound #5 Left,Circumferential Toe Great o Other: - betadine or providone-iodine (paint on wound) Wound #6 Left,Circumferential Toe Fourth o Other: - betadine or providone-iodine (paint on wound) Wound #7 Right Gluteus o Silver Collagen - endoform in clinic o Drawtex Wound #12 Left Calcaneus o Santyl Ointment Wound #8 Left,Lateral Foot o Santyl Ointment Secondary Dressing Wound #10 Left,Circumferential Toe Fifth o Dry Gauze o Conform/Kerlix Wound #12 Left Calcaneus o Dry Gauze o Conform/Kerlix Wound #5 Left,Circumferential Toe Great o Dry Gauze o Conform/Kerlix Wound #6 Left,Circumferential Toe Fourth o Dry Gauze o Conform/Kerlix Wound #8 Left,Lateral Foot o Dry Gauze o Conform/Kerlix Wound #7 Right Gluteus o Boardered Foam Dressing Dressing Change Frequency Wound #10 Left,Circumferential Toe Fifth o Change dressing every other day. o Other: - PRN Wound #5 Left,Circumferential Toe Great o Change dressing every other day. [name], [name] ([id number]) o Other: - PRN Wound #6 Left,Circumferential Toe Fourth o Change dressing every other day. o Other: - PRN Wound #7 Right Gluteus o Change dressing every other day. o Other: - PRN Wound #8 Left,Lateral Foot o Change dressing every other  day. o Other: - PRN Follow-up Appointments Wound #10 Left,Circumferential Toe Fifth o Return Appointment in 3 weeks. Wound #12 Left Calcaneus o Return Appointment in 3 weeks. Wound #5 Left,Circumferential Toe Great o Return Appointment in 3 weeks. Wound #6 Left,Circumferential Toe Fourth o Return Appointment in 3 weeks. Wound #7 Right Gluteus o Return Appointment in 3 weeks. Wound #8 Left,Lateral Foot o Return Appointment in 3 weeks. Off-Loading Wound #7 Right Gluteus o Turn and reposition every 2 hours Additional Orders / Instructions Wound #10 Left,Circumferential Toe Fifth o Increase protein intake. Wound #5 Left,Circumferential Toe Great o Increase protein intake. Wound #6 Left,Circumferential Toe Fourth o Increase protein intake. Wound #7 Right Gluteus o Increase protein intake. Wound #8 Left,Lateral Foot o Increase protein intake. Home Health Wound #10 Left,Circumferential Toe [place] Visits [name], [name] ([id number]) o [place] Nurse may visit PRN to address patientos wound care needs. o FACE TO FACE ENCOUNTER: MEDICARE and MEDICAID PATIENTS: I certify that this patient is under my care and that I had a face-to-face encounter that meets the physician face-to-face encounter requirements with this patient on this date. The encounter with the patient was in whole or in part for the following MEDICAL CONDITION: (primary reason for [place]) MEDICAL NECESSITY: I certify, that based on my findings, NURSING services are a medically necessary home health service. HOME BOUND STATUS: I certify that my clinical findings support that this patient is homebound (i.e., Due to illness or injury, pt requires aid of supportive devices such as crutches, cane, wheelchairs, walkers, the use of special transportation or the assistance of another person to leave their place of residence. There is a normal inability to leave the  home and doing so requires considerable and taxing effort. Other absences are for medical reasons / religious services and are infrequent or of short duration when for other reasons). o If current dressing causes regression in wound condition, may D/C ordered dressing product/s and apply Normal Saline Moist Dressing daily until next [place] / Other MD appointment. [place] of regression in wound condition at [phone]. o Please direct any NON-WOUND related issues/requests for orders to patient's Primary Care Physician Wound #5 Left,Circumferential [place] Nurse [phone]  visit PRN to address patientos wound care needs. o FACE TO FACE ENCOUNTER: MEDICARE and MEDICAID PATIENTS: I certify that this patient is under my care and that I had a face-to-face encounter that meets the physician face-to-face encounter requirements with this patient on this date. The encounter with the patient was in whole or in part for the following MEDICAL CONDITION: (primary reason for [place]) MEDICAL NECESSITY: I certify, that based on my findings, NURSING services are a medically necessary home health service. HOME BOUND STATUS: I certify that my clinical findings support that this patient is homebound (i.e., Due to illness or injury, pt requires aid of supportive devices such as crutches, cane, wheelchairs, walkers, the use of special transportation or the assistance of another person to leave their place of residence. There is a normal inability to leave the home and doing so requires considerable and taxing effort. Other absences are for medical reasons / religious services and are infrequent or of short duration when for other reasons). o If current dressing causes regression in wound condition, may D/C ordered dressing product/s and apply Normal Saline Moist Dressing daily until next [place] / Other MD  appointment. [place] of regression in wound condition at [phone]. o Please direct any NON-WOUND related issues/requests for orders to patient's Primary Care Physician Wound #6 Left,Circumferential Toe [place] Nurse may visit PRN to address patientos wound care needs. o FACE TO FACE ENCOUNTER: MEDICARE and MEDICAID PATIENTS: I certify that this patient is under my care and that I had a face-to-face encounter that meets the physician face-to-face encounter requirements with this patient on this date. The encounter with the patient was in whole or in part for the following MEDICAL CONDITION: (primary reason for [place]) MEDICAL NECESSITY: I certify, that based on my findings, NURSING services are a medically necessary home health service. HOME BOUND STATUS: I certify that my clinical findings support that this patient is homebound (i.e., Due to illness or injury, pt requires aid of supportive devices such as crutches, cane, wheelchairs, walkers, the use of special transportation or the assistance of another person to leave their place of residence. There is a normal inability to leave the home and doing so requires considerable and taxing effort. Other absences are for medical reasons / religious services and are infrequent or of short duration when for other reasons). o If current dressing causes regression in wound condition, may D/C ordered dressing product/s and apply Normal Saline Moist Dressing daily until next [place] / Other MD appointment. [place] of regression in wound condition at [phone]. o Please direct any NON-WOUND related issues/requests for orders to patient's Primary Care Physician Wound #7 Right [place] Nurse may visit PRN to address patientos wound care needs. o FACE TO FACE ENCOUNTER: MEDICARE and  MEDICAID PATIENTS: I certify that this patient is under my care and that I had a face-to-face encounter that meets the physician face-to-face encounter requirements with this patient on this date. The encounter with the patient was in whole or in part for the following MEDICAL CONDITION: (primary reason for [place]) MEDICAL NECESSITY: I certify, that based on my findings, SHELVY, HECKERT ([id number]) NURSING services are a medically necessary home health service. HOME BOUND STATUS: I certify that my clinical findings support that this patient is homebound (i.e., Due to illness or injury, pt requires aid of supportive  devices such as crutches, cane, wheelchairs, walkers, the use of special transportation or the assistance of another person to leave their place of residence. There is a normal inability to leave the home and doing so requires considerable and taxing effort. Other absences are for medical reasons / religious services and are infrequent or of short duration when for other reasons). o If current dressing causes regression in wound condition, may D/C ordered dressing product/s and apply Normal Saline Moist Dressing daily until next [place] / Other MD appointment. [place] of regression in wound condition at [phone]. o Please direct any NON-WOUND related issues/requests for orders to patient's Primary Care Physician Wound #8 [place] Visits o Home Health Nurse may visit PRN to address patientos wound care needs. o FACE TO FACE ENCOUNTER: MEDICARE and MEDICAID PATIENTS: I certify that this patient is under my care and that I had a face-to-face encounter that meets the physician face-to-face encounter requirements with this patient on this date. The encounter with the patient was in whole or in part for the following MEDICAL CONDITION: (primary reason for [place]) MEDICAL NECESSITY: I certify,  that based on my findings, NURSING services are a medically necessary home health service. HOME BOUND STATUS: I certify that my clinical findings support that this patient is homebound (i.e., Due to illness or injury, pt requires aid of supportive devices such as crutches, cane, wheelchairs, walkers, the use of special transportation or the assistance of another person to leave their place of residence. There is a normal inability to leave the home and doing so requires considerable and taxing effort. Other absences are for medical reasons / religious services and are infrequent or of short duration when for other reasons). o If current dressing causes regression in wound condition, may D/C ordered dressing product/s and apply Normal Saline Moist Dressing daily until next [place] / Other MD appointment. [place] of regression in wound condition at [phone]. o Please direct any NON-WOUND related issues/requests for orders to patient's Primary Care Physician Electronic Signature(s) Signed: 04/10/2018 5:24:01 PM By: [name] Signed: 04/10/2018 9:42:33 PM By: Jamie Keeler PA-C Entered By: [name] on 04/10/2018 14:54:45 [name], [name] ([id number]) -------------------------------------------------------------------------------- Problem List Details Patient Name: SAROYA, RICCOBONO. Date of Service: 04/10/2018 1:45 PM Medical Record Number: [id number] Patient Account Number: [id number] Date of Birth/Sex: 1966-09-25 (52 y.o. 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 ([id number]) -------------------------------------------------------------------------------- Progress Note Details Patient Name: [name]. Date of Service: 04/10/2018 1:45 PM Medical Record Number: [id number] Patient Account Number: [id number] Date of Birth/Sex: Dec 10, 1965 (52 y.o. F) Treating RN: [name] Primary Care Provider: Delight [name] Other Clinician:  Referring Provider: Delight [name] Treating Provider/Extender: [name], [name] Weeks in Treatment: 16 Subjective Chief Complaint Information obtained from Patient She is here in follow up evaluation for multiple wounds History of Present Illness (HPI) 52 year old patient was sent to [place] from [facility] where she was seen by Dr. Sherril [name] for a left ankle ulceration and was recently hospitalized with hypotension and sepsis. She was treated with IV antibiotics and has been scheduled to see Dr. Sharol [name]. She was seen by vascular surgery who recommended a femoral bypass but surgery has been delayed until [name] sacral wound from last year's necrotizing fasciitis has healed. Was seeing the wound care team at [facility] but wanted to change over. She is a smoker and smokes a pack of cigarettes a day The patient was recently admitted in [place] between February 2 and February 14. She had a follow-up to see vascular surgery, orthopedic surgery  and infectious disease. During [name] admission she was known to have peripheral arterial disease, diabetes mellitus with neuropathy, chronic pain, open wound with necrotizing fasciitis of the sacral area which had been there since November 2017 Past medical history significant for diabetes mellitus, ankle ulcer, sacral ulcer, necrotizing fasciitis, arterial occlusive disease, tobacco abuse. Review of the electronic medical records reveals that Dr. 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 An MRI showed edema in the bone which was consistent with osteomyelitis and the chronic ulcer and may need surgical intervention. She also had a sacral decubitus ulcer which was treated with the wound VAC. On 01/07/2017 she was taken up by the vascular surgeon Dr. Trula [name] for an abdominal aortogram and bilateral lower extremity runoff, for a history of having bilateral femoropopliteal bypass graft as well as external iliac stenting on the right and stenting of [name] bypass graft. She had developed a nonhealing wound on [name] right ankle and there was a possibility of a femoral occlusion. the findings were noted and the impression was a surgical revascularization with a aorto bifemoral bypass graft. Both the femoropopliteal bypass grafts were patent. 01/31/2017 -- x-ray of the right hip and pelvis -- IMPRESSION:No radiographic evidence of acute osteomyelitis. Normal-appearing right hip joint space for age. No acute bony abnormality of the hip. 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 Since seeing [name] last week she has not had an appointment with infectious disease or the orthopedic specialist yet. 02/06/2017 -- the patient missed a couple of appointments yesterday due to the weather but other than that has apparently [name] up smoking for the last 5  days. 02/13/2017 -- she has rescheduled [name] infectious disease appointment and also the appointment with the orthopedic surgeon Dr. Sharol [name] 03/06/2017 -- she was seen by Dr. Lucianne Lei [name] of infectious disease on 03/05/2017 and he recommendedIV antibiotics but the patient did not want to have that and he has [name] [name] Augmentin and doxycycline and will reevaluate [name] in 2 months time. 03/13/2017 -- he saw Dr. Trula [name] regarding [name] vascular issues and he would like to wait to the sacral wound is completely closed before considering aortobifemoral bypass graft. He will see [name] back in 2 months time. She was also seen by Dr. Sharol [name] of orthopedics who recommended continue wound care dressings and follow in the office in about 2 months time. he also recommended 3 view radiographs of the right  ankle at follow-up. 03/28/2017 -- she did have a PICC line placed and Dr. Lucianne Lei [name] has begun IV vancomycin and ceftriaxone. she is going to continue this until she sees him in approximately a month's time 04/18/2017 -- we had applied for a skin substitute for the patient's care but [name] copayment is going to be about $300 a piece and the patient will not be able to afford this. 05/08/17 on evaluation today patient appears to potentially have a fungal rash in the periwound region of the sacral area. This Eschmann, Cerinity S. ([id number]) also seems to extend into the inguinal creases and factional region as well. She is tender to palpation with light rubbing over the region of the periwound and the skin in this region does have a beefy red appearance. Everything seems to point to a fungal infection at this time. She has been tolerating the dressing changes up to this point. There is no evidence of infection otherwise. 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 He did not think that she is making progress and was not able  to examine [name] sacral decubitus ulcer as the patient would not allow it. She was switched to Bactrim DS 2 tablets twice a day since finishing [name] antibiotics. [name] ESR was 76 and a CRP was 2.8. He again discussed with [name] that she would need amputation to cure [name] osteomyelitis of fibula but he would continue to try suppressive antibiotics. 05/23/17 on evaluation today patient's wound appears to be doing about the same in regard to the ankle as well as the gluteal area. She apparently has issues with the one back staying in place and she states the sill seems to break up the inferior aspect. She does have home help coming out to apply the wound VAC. She has been tolerating the dressing changes without any problem although she has had some increased pain in the gluteal region due to a fall she sustained and she feels like she brews that area and she had pain actually radiating down [name] leg which is slowly beginning to improve. There's no evidence of infection. 05/29/17 Unfortunately on evaluation today patient's wounds both in regard to the ankle as well as in regard to [name] gluteal region on the right appear to be measuring the same as last week's evaluation. She also tells me that she is having soreness today in regard to the right gluteal area because she had a visitor who came to see [name] a couple days ago and stayed for six hours and she had to set up for that entire length of time visiting. This has called some increased discomfort. Normally she does not set up for those long periods of time. Fortunately there's no evidence of infection and no worsening of the wounds. 06/13/2017 -- the patient has had a lot of health issues including perfuse diarrhea and generalized weakness and I have instructed [name] to see [name] PCP as soon as possible and if things get worse to go to the ER. She has not brought [name] wound VAC today. She continues to be on oral antibiotics 06/30/17- she is here in follow up evaluation  for right ischial and right lateral malleolus ulcer. She continues with negative pressure wound therapy to the ischial wound. She is currently on a diabetic therapy per infectious disease, although she does state that she inconsistently takes it. She was advised  to take [name] antibiotic as prescribed 07/11/2017 -- she has been unable to use a wound VAC regularly because she loses this evening and it has not been possible for [name] to use it. 07/18/2017 -- she is much more comfortable and looks better without the wound VAC and at this stage I believe it will be better for [name] to return the machine and continue with local care. 08/07/2017 -- she is back up to 3 weeks due to a vacation and during this time [name] wound VAC was returned. She has been doing local dressings 08/15/17 on evaluation today patient appears to be doing about the same in regard to [name] ankle as well as right gluteal wound. She has been tolerating the dressing changes and tells me that it's mainly [name] gluteal wound that hurts the ankle is nontender to palpation. She rates this pain to be a one out of 10 at rest and with cleansing of the wound five out of 10 at least. No fevers, chills, nausea, or vomiting noted at this time. She does note that home health has told [name] to inquire about possibly using silver nitrate on the wound edges due to the road nature of the edges. 08/29/2017 -- -- I got a note which Dr. Lucianne Lei [name] sent to Dr. Sharol [name] regarding plain film showing osteomyelitis in the lateral malleolus consistent with osteomyelitis which was worrisome. He had recommended definitive surgery to fix this. He recommended to continue with Bactrim Right ankle x-ray -- IMPRESSION: Findings compatible with cellulitis of the lateral malleolar region. A small focus of bony destruction involving the tip of the lateral malleolus is worrisome for osteomyelitis. 09/05/2017 -- the patient was seen by Dr. Meridee Score yesterday who thought [name] wound looked  good and he would continue local care with silver alginate dressing and follow-up in 4 weeks. He did not recommend any surgical intervention of the ankle until there is good arterial flow and he would await Dr. Stephens Shire opinion regarding this. She was also seen by infectious disease Dr. Lucianne Lei [name], who was concerned about [name] elevated potassium and took [name] off Bactrim. he has recommended doxycycline. 09/26/2017 -- she is back after 3 weeks as she was out of town on vacation. She has put on some weight but other than that CHERL, GORNEY. ([id number]) no change in [name] health 10/24/2017 -- she was seen by Dr. [name] -- after a thorough review he plans a angiographically via the left femoral approach to evaluate [name] left subclavian artery and address any stenosis at that time. He will also perform bilateral runoff to confirm that a right femoropopliteal as well as a left femoropopliteal bypass graft remained patent. He is then considering a right axillary to femoral bypass graft for limb salvage. ==== 12/18/17-she is here in follow-up evaluation for multiple wounds. She is s/p aortobifemoral and left fem-pop revision. She presents with multiple necrotic areas to the left toes and lateral foot. The wounds we were originally seen [name] for (right heel and left buttock) are improved/stable. According to [name] husband the wounds to the left foot are "better" than they were upon discharge from the hospital last Tuesday. He states that Dr. Trula [name] saw [name] on same day of discharge. She has a follow-up with Dr. Trula [name] on Monday. There are dopplerable pulses to the left dorsalis pedis, posterior tibial, peroneal. We communicated with Dr. Stephens Shire nurse regarding today's findings, photos were sent to [name] email and the patient will maintain [name] appointment on Monday. We discussed  with the patient and [name] husband to report to [facility] in [place] for any concerns, changes, new areas of necrosis, new  areas or progressive ischemia/purple discoloration, pallor, new/increased pain. We will anticipate follow up next week 12/30/17 patient unfortunately continues to have multiple wound of [name] lower extremities and specifically [name] left toes and lateral foot. She does have regular follow-up visits with Dr. Trula [name] who performed [name] surgery and at this point they are just holding to wait and see what needs to be done in regard to [name] foot such as what's going to heal and what may need to have potential amputation of further surgery. For this reason we are just trying to maintain and see were things end up at this point. Fortunately patient is not having any significant discomfort. The right lateral malleolus appears to be doing about the same there is some granulation tissue she does have osteomyelitis and not ulcer. 01/06/18 patient presents today for fault evaluation concerning [name] ongoing issues with [name] right lateral ankle, right gluteal region, and left lower extremity. Currently regard to left lower extremity it's possible that she may require some amputation although patient states she has the appointment follow-up with Dr. Trula [name] next week. That's when she will find out what he thinks needs to be done at that point. Nonetheless in regard to the right lateral ankle this appears to be doing fairly well today. [name] right gluteal region actually appears to be doing fairly well with a good wound surface any slough that was present easily cleaned off with saline and gauze. 01/16/18 on evaluation today patient appears to be doing much better actually in regard to [name] left foot necrotic areas. A lot of this is starting to liftoff as far as the eschar is concerned and there is a lot of healing underneath which is excellent news. I'm actually pleasantly pleased with the results that she's getting in this regard. Fortunately there does not appear to be any evidence of infection which is great news. Overall she  has been tolerating the dressing changes without complication she does have one new ulcer on the right anterior shin due to a small skin tear which is actually healed but then the adhesive from a Band-Aid that was put on it at a doctor's office actually has 20 skin and she says while he has a small ulcer due to that. Fortunately this is superficial. 01/30/18 on evaluation today patient appears to be doing rather well in regard to [name] left lower extremity ulcerations as well as [name] right lateral malleolus and gluteal region. She has been tolerating the dressing changes in general without complication. She did see Dr. Trula [name] [name] vascular surgeon since I last saw [name] in the good news is he does not really feel like he's gonna have to perform anything surgical in regard to deputation. With that being said I am still somewhat concerned about [name] left fifth toe we will have to see. Nonetheless overall I do believe she's making progress and [name] right lateral malleolus appears to have at the allies to over at this point the question will be if this stays covered over or not. 02/13/18 on evaluation today patient continues to do rather well in regard to [name] ulcerations. Everything seems to be doing better [name] right lateral ankle is still open but just barely. Overall I have been happy with the progress. Patient also is happy she states even before I saw it and said anything she Artie knew it was better  just from what she can feel. 02/27/18 on evaluation today patient presents for follow-up concerning [name] left lower extremity ulcer. She has been tolerating the dressing changes without complication she performs a lot of these on [name] own although she's not able to really visualize the wound bed due to the fact that she is legally blind. She can see some things movement wise but not much. With that being said this really does not look to be as good as when I saw [name] two weeks ago on the 29th. We had been slowly  seeing the progress in regard to the eschar regions which were starting to lift up with the Betadine and doing great. Today I'm not seen as much of that and specifically around the great toe on the lateral aspect there appears to be some pus on the line and surrounding the eschar. She also has the same thing noted along with mottled skin in the region of the lateral fifth toe. Unfortunately these digits are also somewhat cyanotic at this point and cool to touch. [name] leg is erythematous from around mid calf down to the foot as well. All of this is new since last evaluation two weeks ago. Patient really does not have any discomfort and really notes nothing has changed since [name] last visit in that regard. She continues to perform a lot of the NENA, HAMPE. ([id number]) dressing changes herself at this time. Overall I have been extremely pleased with how things have gone up into this point since [name] surgery. She does see Dr. Trula [name] on a regular basis on his last check with [name] things were going well. 03/13/18 on evaluation today patient appears to be doing well in regard to [name] ischial wound. With that being said unfortunately [name] left lower extremity appears to have progressed even since I last evaluated [name]. She did go to the hospital where she had a CT angiogram performed. It did show as part of the study significantly that she had a left femoral popliteal bypass graft which is completely included. Arterial flow to left lower extremity is supplied to the deep femoral branches. On the right the revision of the right femoral popliteal bypass graph is still patent. Unfortunately visually [name] wounds specifically the necrotic regions of [name] left foot appear to be worse even compared to my last evaluation with [name]. Especially the great toe and the fifth toe. Left lower extremity foot ulcers. Specifically 04/10/18 on evaluation today patient presents for follow-up concerning [name] [name] left first toe, fourth  toe, and fifth toe are affected as well as the lateral portion of [name] foot. Since have last seen [name] she has actually undergone two procedures with vein and vascular. This included a lower extremity bypass with artificial vessels. She seemed to tolerate the surgeries well according what she's telling me today with no complications and she seems to be doing much better at this point in time which is good news. [name] foot show signs of good perfusion. 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 Patient History Information obtained from Patient. Family History Diabetes - Mother,Father,Maternal Grandparents,Paternal Grandparents,Child, Heart Disease - [place] Grandparents, Hereditary Spherocytosis - Mother, Hypertension - Mother,Siblings,Paternal Grandparents,Maternal Grandparents, Seizures - Maternal Grandparents,Paternal Grandparents, Thyroid Problems - Siblings, No family history of Cancer, Kidney Disease, Lung Disease, Stroke, Tuberculosis. Social History Current every day smoker, Marital Status - Married, Alcohol Use -  Rarely, Drug Use - No History, Caffeine Use - Moderate. Review of Systems (ROS) Constitutional Symptoms (General Health) Denies complaints or symptoms of Fever, Chills. Respiratory The patient has no complaints or symptoms. Cardiovascular The patient has no complaints or symptoms. Psychiatric The patient has no complaints or symptoms. Objective Constitutional Chronically ill appearing but in no apparent acute distress. Vitals Time Taken: 1:59 PM, Height: 67 in, Temperature: 98.3 F, Pulse: 83 bpm, Respiratory Rate: 16 breaths/min, Blood Pressure: 81/49 mmHg. Mohon, Asharia S. ([id number]) Respiratory normal breathing without difficulty. clear to auscultation bilaterally. Cardiovascular regular rate and  rhythm with normal S1, S2. 1+ dorsalis pedis/posterior tibialis pulses. no clubbing, cyanosis, significant edema, Psychiatric this patient is able to make decisions and demonstrates good insight into disease process. Alert and Oriented x 3. pleasant and cooperative. General Notes: On evaluation today the patient appears to be doing well in regard to [name] foot from the standpoint of vascular status definitely much better than when I last visualized [name] foot. She seems to have great blood flow. With that being said I even did detect a pulse although one plus it was still present which was also a change for the better. However she continues to have areas of gangrene including the entirety of the left first toe and portions of the left fourth toe and fifth toe. [name] sacral wound continues to do well at this point which is good news. Integumentary (Hair, Skin) Wound #10 status is Open. Original cause of wound was Gradually Appeared. The wound is located on the Left,Circumferential Toe Fifth. The wound measures 6.1cm length x 3cm width x 0.1cm depth; 14.373cm^2 area and 1.437cm^3 volume. Wound #12 status is Open. Original cause of wound was Gradually Appeared. The wound is located on the Left Calcaneus. The wound measures 9.5cm length x 7.5cm width x 0.2cm depth; 55.96cm^2 area and 11.192cm^3 volume. There is no tunneling or undermining noted. There is a medium amount of serous drainage noted. The wound margin is indistinct and nonvisible. Wound #3 status is Healed - Epithelialized. Original cause of wound was Pressure Injury. The wound is located on the Right,Lateral Malleolus. The wound measures 0cm length x 0cm width x 0cm depth; 0cm^2 area and 0cm^3 volume. Wound #5 status is Open. Original cause of wound was Gradually Appeared. The wound is located on the Left,Circumferential Toe Great. The wound measures 8cm length x 4.5cm width x 0.1cm depth; 28.274cm^2 area and 2.827cm^3 volume. Wound #6  status is Open. Original cause of wound was Gradually Appeared. The wound is located on the Left,Circumferential Toe Fourth. The wound measures 5.5cm length x 1.5cm width x 0.1cm depth; 6.48cm^2 area and 0.648cm^3 volume. Wound #7 status is Open. Original cause of wound was Gradually Appeared. The wound is located on the Right Gluteus. The wound measures 1.4cm length x 1cm width x 0.3cm depth; 1.1cm^2 area and 0.33cm^3 volume. Wound #8 status is Open. Original cause of wound was Gradually Appeared. The wound is located on the Left,Lateral Foot. The wound measures 6.5cm length x 3.5cm width x 0.2cm depth; 17.868cm^2 area and 3.574cm^3 volume. 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 ([id number]) Plan Wound Cleansing: Wound #10 Left,Circumferential Toe Fifth: Clean wound with Normal Saline. Wound #12 Left Calcaneus: Clean wound with Normal Saline. Wound #5 Left,Circumferential Toe Great: Clean wound with Normal Saline. Wound #6 Left,Circumferential Toe Fourth: Clean wound with Normal Saline. Wound #7 Right Gluteus: Clean wound with Normal Saline. Wound #8 Left,Lateral Foot: Clean wound with Normal Saline. Anesthetic (add to Medication List): Wound #7 Right Gluteus: Topical Lidocaine 4% cream applied to wound bed prior to debridement (In Clinic Only). Skin Barriers/Peri-Wound Care: Wound #10 Left,Circumferential Toe Fifth: Skin Prep Wound #5 Left,Circumferential Toe Great: Skin Prep Wound #6  Left,Circumferential Toe Fourth: Skin Prep Wound #7 Right Gluteus: Skin Prep Wound #8 Left,Lateral Foot: Skin Prep Primary Wound Dressing: Wound #10 Left,Circumferential Toe Fifth: Other: - betadine or providone-iodine (paint on wound) Wound #5 Left,Circumferential Toe Great: Other: - betadine or providone-iodine (paint on wound) Wound #6 Left,Circumferential Toe Fourth: Other: - betadine or providone-iodine (paint on wound) Wound #7 Right Gluteus: Silver Collagen - endoform in clinic Drawtex Wound #12 Left Calcaneus: Santyl Ointment Wound #8 Left,Lateral Foot: Santyl Ointment Secondary Dressing: Wound #10 Left,Circumferential Toe Fifth: Dry Gauze Conform/Kerlix Wound #12 Left Calcaneus: Dry Gauze Conform/Kerlix Wound #5 Left,Circumferential Toe Great: Dry Gauze Conform/Kerlix Tamer, Newell S. ([id number]) Wound #6 Left,Circumferential Toe Fourth: Dry Gauze Conform/Kerlix Wound #8 Left,Lateral Foot: Dry Gauze Conform/Kerlix Wound #7 Right Gluteus: Boardered Foam Dressing Dressing Change Frequency: Wound #10 Left,Circumferential Toe Fifth: Change dressing every other day. Other: - PRN Wound #5 Left,Circumferential Toe Great: Change dressing every other day. Other: - PRN Wound #6 Left,Circumferential Toe Fourth: Change dressing every other day. Other: - PRN Wound #7 Right Gluteus: Change dressing every other day. Other: - PRN Wound #8 Left,Lateral Foot: Change dressing every other day. Other: - PRN Follow-up Appointments: Wound #10 Left,Circumferential Toe Fifth: Return Appointment in 3 weeks. Wound #12 Left Calcaneus: Return Appointment in 3 weeks. Wound #5 Left,Circumferential Toe Great: Return Appointment in 3 weeks. Wound #6 Left,Circumferential Toe Fourth: Return Appointment in 3 weeks. Wound #7 Right Gluteus: Return Appointment in 3 weeks. Wound #8 Left,Lateral Foot: Return Appointment in 3 weeks. Off-Loading: Wound #7 Right Gluteus: Turn and  reposition every 2 hours Additional Orders / Instructions: Wound #10 Left,Circumferential Toe Fifth: Increase protein intake. Wound #5 Left,Circumferential Toe Great: Increase protein intake. Wound #6 Left,Circumferential Toe Fourth: Increase protein intake. Wound #7 Right Gluteus: Increase protein intake. Wound #8 Left,Lateral Foot: Increase protein intake. Home Health: Wound #10 Left,Circumferential Toe Fifth: [place] Nurse may visit PRN to address patient s wound care needs. FACE TO FACE ENCOUNTER: MEDICARE and MEDICAID PATIENTS: I certify that this patient is under my care and that I had a face-to-face encounter that meets the physician face-to-face encounter requirements with this patient on this date. The encounter with the patient was in whole or in part for the following MEDICAL CONDITION: (primary reason for [place]) MEDICAL NECESSITY: I certify, that based on my findings, NURSING services are a medically necessary home health service. HOME BOUND STATUS: I certify that my clinical findings support that this patient is homebound (i.e., Due to MEGON, KALINA. ([id number]) illness or injury, pt requires aid of supportive devices such as crutches, cane, wheelchairs, walkers, the use of special transportation or the assistance of another person to leave their place of residence. There is a normal inability to leave the home and doing so requires considerable and taxing effort. Other absences are for medical reasons /  religious services and are infrequent or of short duration when for other reasons). If current dressing causes regression in wound condition, may D/C ordered dressing product/s and apply Normal Saline Moist Dressing daily until next [place] / Other MD appointment. [place] of regression in wound condition at [phone]. Please direct any NON-WOUND related issues/requests for orders to patient's  Primary Care Physician Wound #5 Left,Circumferential Toe Great: [place] Nurse may visit PRN to address patient s wound care needs. FACE TO FACE ENCOUNTER: MEDICARE and MEDICAID PATIENTS: I certify that this patient is under my care and that I had a face-to-face encounter that meets the physician face-to-face encounter requirements with this patient on this date. The encounter with the patient was in whole or in part for the following MEDICAL CONDITION: (primary reason for [place]) MEDICAL NECESSITY: I certify, that based on my findings, NURSING services are a medically necessary home health service. HOME BOUND STATUS: I certify that my clinical findings support that this patient is homebound (i.e., Due to illness or injury, pt requires aid of supportive devices such as crutches, cane, wheelchairs, walkers, the use of special transportation or the assistance of another person to leave their place of residence. There is a normal inability to leave the home and doing so requires considerable and taxing effort. Other absences are for medical reasons / religious services and are infrequent or of short duration when for other reasons). If current dressing causes regression in wound condition, may D/C ordered dressing product/s and apply Normal Saline Moist Dressing daily until next [place] / Other MD appointment. [place] of regression in wound condition at [phone]. Please direct any NON-WOUND related issues/requests for orders to patient's Primary Care Physician Wound #6 Left,Circumferential Toe Fourth: [place] Nurse may visit PRN to address patient s wound care needs. FACE TO FACE ENCOUNTER: MEDICARE and MEDICAID PATIENTS: I certify that this patient is under my care and that I had a face-to-face encounter that meets the physician face-to-face encounter requirements with this patient on this  date. The encounter with the patient was in whole or in part for the following MEDICAL CONDITION: (primary reason for [place]) MEDICAL NECESSITY: I certify, that based on my findings, NURSING services are a medically necessary home health service. HOME BOUND STATUS: I certify that my clinical findings support that this patient is homebound (i.e., Due to illness or injury, pt requires aid of supportive devices such as crutches, cane, wheelchairs, walkers, the use of special transportation or the assistance of another person to leave their place of residence. There is a normal inability to leave the home and doing so requires considerable and taxing effort. Other absences are for medical reasons / religious services and are infrequent or of short duration when for other reasons). If current dressing causes regression in wound condition, may D/C ordered dressing product/s and apply Normal Saline Moist Dressing daily until next [place] / Other MD appointment. [place] of regression in wound condition at [phone]. Please direct any NON-WOUND related issues/requests for orders to patient's Primary Care Physician Wound #7 Right Gluteus: [place] Nurse may visit PRN to address patient s wound care needs. FACE TO FACE ENCOUNTER: MEDICARE and MEDICAID PATIENTS: I certify that this patient is under my care and that I had a face-to-face encounter that meets the physician face-to-face encounter requirements with this patient on  this date. The encounter with the patient was in whole or in part for the following MEDICAL CONDITION: (primary reason for [place]) MEDICAL NECESSITY: I certify, that based on my findings, NURSING services are a medically necessary home health service. HOME BOUND STATUS: I certify that my clinical findings support that this patient is homebound (i.e., Due to illness or injury, pt requires aid of  supportive devices such as crutches, cane, wheelchairs, walkers, the use of special transportation or the assistance of another person to leave their place of residence. There is a normal inability to leave the home and doing so requires considerable and taxing effort. Other absences are for medical reasons / religious services and are infrequent or of short duration when for other reasons). If current dressing causes regression in wound condition, may D/C ordered dressing product/s and apply Normal Saline Moist Dressing daily until next [place] / Other MD appointment. [place] of regression in wound condition at [phone]. Please direct any NON-WOUND related issues/requests for orders to patient's Primary Care Physician Wound #8 Left,Lateral Foot: [place] Nurse may visit PRN to address patient s wound care needs. FACE TO FACE ENCOUNTER: MEDICARE and MEDICAID PATIENTS: I certify that this patient is under my care and that I Speights, [place]. ([id number]) had a face-to-face encounter that meets the physician face-to-face encounter requirements with this patient on this date. The encounter with the patient was in whole or in part for the following MEDICAL CONDITION: (primary reason for [place]) MEDICAL NECESSITY: I certify, that based on my findings, NURSING services are a medically necessary home health service. HOME BOUND STATUS: I certify that my clinical findings support that this patient is homebound (i.e., Due to illness or injury, pt requires aid of supportive devices such as crutches, cane, wheelchairs, walkers, the use of special transportation or the assistance of another person to leave their place of residence. There is a normal inability to leave the home and doing so requires considerable and taxing effort. Other absences are for medical reasons / religious services and are infrequent or of short duration when  for other reasons). If current dressing causes regression in wound condition, may D/C ordered dressing product/s and apply Normal Saline Moist Dressing daily until next [place] / Other MD appointment. [place] of regression in wound condition at [phone]. Please direct any NON-WOUND related issues/requests for orders to patient's Primary Care Physician Currently I'm gonna suggest that we go ahead and discontinue Betadine to the lateral foot ulcers due to the fact that they're starting to soften up and I think a more appropriate dressing would be Santyl she Artie has some Santyl at home. We will start using that today. We will continue with the Betadine for the first, fourth, and fifth toes. We will see were things stand in follow-up. Subsequently we will also continue with the Prisma for the sacral ulcer. Patient is in agreement all around. Please see above for specific wound care orders. We will see patient for re-evaluation in 1 week(s) here in the clinic. If anything worsens or changes patient will contact our office for additional recommendations. Electronic Signature(s) Signed: 04/10/2018 9:42:33 PM By: Jamie Keeler PA-C Entered By: [name] on 04/10/2018 21:05:21 TRISTIN, GLADMAN ([id number]) -------------------------------------------------------------------------------- ROS/PFSH Details Patient Name: WANIA, LONGSTRETH. Date of Service: 04/10/2018 1:45 PM Medical Record Number: [id number] Patient Account Number: [id number] Date of Birth/Sex: 1966/06/01 (52 y.o. F) Treating RN:  [name] Primary Care Provider: Delight [name] Other Clinician: Referring Provider: Delight [name] Treating Provider/Extender: [name], [name] Weeks in Treatment: 16 Information Obtained From Patient Wound History Do you currently have one or more open woundso Yes How many open wounds do you currently haveo 4 Approximately how long have you had your woundso 1 month  and other 7 yrs How have you been treating your wound(s) until nowo gauze Has your wound(s) ever healed and then re-openedo No Have you had any lab work done in the past montho No Have you tested positive for an antibiotic resistant organism (MRSA, VRE)o No Have you tested positive for osteomyelitis (bone infection)o Yes Have you had any tests for circulation on your legso Yes Have you had other problems associated with your woundso Swelling Constitutional Symptoms (General Health) Complaints and Symptoms: Negative for: Fever; Chills Eyes Medical History: Negative for: Cataracts; Glaucoma; Optic Neuritis Ear/Nose/Mouth/Throat Medical History: Negative for: Chronic sinus problems/congestion; Middle ear problems Hematologic/Lymphatic Medical History: Positive for: Anemia Negative for: Hemophilia; Human Immunodeficiency Virus; Lymphedema; Sickle Cell Disease Respiratory Complaints and Symptoms: No Complaints or Symptoms Medical History: Negative for: Aspiration; Asthma; Chronic Obstructive Pulmonary Disease (COPD); Pneumothorax; Sleep Apnea; Tuberculosis Cardiovascular Complaints and Symptoms: No Complaints or Symptoms Medical History: BROWNIE, GOCKEL. ([id number]) Positive for: Arrhythmia - a-fib; Coronary Artery Disease; Hypotension; Peripheral Arterial Disease Gastrointestinal Medical History: Negative for: Cirrhosis ; Colitis; Crohnos; Hepatitis A; Hepatitis B; Hepatitis C Endocrine Medical History: Positive for: Type II Diabetes Treated with: Insulin, Oral agents Blood sugar tested every day: Yes Tested : 4 times a day Blood sugar testing results: Bedtime: 190 Genitourinary Medical History: Negative for: End Stage Renal Disease Immunological Medical History: Negative for: Lupus Erythematosus; Raynaudos; Scleroderma Integumentary (Skin) Medical History: Negative for: History of Burn; History of pressure wounds Musculoskeletal Medical History: Positive for:  Osteoarthritis Neurologic Medical History: Negative for: Dementia; Neuropathy; Quadriplegia; Paraplegia; Seizure Disorder Oncologic Medical History: Negative for: Received Chemotherapy; Received Radiation Psychiatric Complaints and Symptoms: No Complaints or Symptoms Medical History: Negative for: Anorexia/bulimia; Confinement Anxiety Immunizations Pneumococcal Vaccine: Received Pneumococcal Vaccination: ROCHELE, LUECK ([id number]) Implantable Devices Family and Social History Cancer: No; Diabetes: Yes - Mother,Father,Maternal Grandparents,Paternal Grandparents,Child; Heart Disease: Yes - Father,Mother,Maternal Grandparents,Paternal Grandparents; Hereditary Spherocytosis: Yes - Mother; Hypertension: Yes - Mother,Siblings,Paternal Grandparents,Maternal Grandparents; Kidney Disease: No; Lung Disease: No; Seizures: Yes - Maternal Grandparents,Paternal Grandparents; Stroke: No; Thyroid Problems: Yes - Siblings; Tuberculosis: No; Current every day smoker; Marital Status - Married; Alcohol Use: Rarely; Drug Use: No History; Caffeine Use: Moderate; Financial Concerns: No; Food, Clothing or Shelter Needs: No; Support System Lacking: No; Transportation Concerns: No; Advanced Directives: No; Patient does not want information on Advanced Directives; Do not resuscitate: No; Living Will: No; Medical Power of Attorney: No Physician Affirmation I have reviewed and agree with the above information. Electronic Signature(s) Signed: 04/10/2018 9:42:33 PM By: Jamie Keeler PA-C Signed: 04/14/2018 5:40:20 PM By: [name] Entered By: [name] on 04/10/2018 21:02:49 [place], Cambrie Chauncey [name] ([id number]) -------------------------------------------------------------------------------- SuperBill Details Patient Name: ZOELLE, MARKUS. Date of Service: 04/10/2018 Medical Record Number: [id number] Patient Account Number: [id number] Date of Birth/Sex: 14-Nov-1966 (52 y.o. 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 S31.819S Unspecified open wound of right buttock, sequela L97.312 Non-pressure chronic ulcer of right ankle with fat layer exposed Facility Procedures CPT4 Code: 11031594 Description: [phone] - WOUND CARE VISIT-LEV 5 EST PT Modifier: Quantity: 1 Physician Procedures CPT4: Description Modifier Quantity Code [id number] - WC PHYS LEVEL 4 - EST PT 1 ICD-10 Diagnosis Description R11.657X Unspecified open wound of right buttock, sequela I70.393 Other atherosclerosis of unspecified type of bypass graft(s) of the  extremities, bilateral legs 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

## 2018-04-20 ENCOUNTER — Encounter: Payer: Self-pay | Admitting: Surgery

## 2018-04-20 ENCOUNTER — Ambulatory Visit (INDEPENDENT_AMBULATORY_CARE_PROVIDER_SITE_OTHER): Payer: Medicare Other | Admitting: Surgery

## 2018-04-20 VITALS — BP 73/43 | HR 110 | Temp 102.2°F | Resp 17 | Ht 67.0 in | Wt 160.0 lb

## 2018-04-20 DIAGNOSIS — I739 Peripheral vascular disease, unspecified: Secondary | ICD-10-CM

## 2018-04-20 DIAGNOSIS — T82898D Other specified complication of vascular prosthetic devices, implants and grafts, subsequent encounter: Secondary | ICD-10-CM

## 2018-04-20 MED ORDER — CLINDAMYCIN HCL 300 MG PO CAPS: 300.0000 mg | ORAL_CAPSULE | Freq: Three times a day (TID) | ORAL | 1 refills | Status: DC

## 2018-04-20 NOTE — Progress Notes (Signed)
Patient name: [name] MRN: [id number] DOB: 08-Mar-1966 Sex: female  REASON FOR VISIT:     post op  HISTORY OF PRESENT ILLNESS:   [name] Milesis a 51y.o.femalewho presents for follow-up.  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 She has had multiple interventions for recurrent stenosis. 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 Because of the infectious issues, the patient's revascularization is been delayed. Ultimately on 11/28/2017 she underwent an aortobifemoral bypass graft, and thrombectomy and bilateral femoral-popliteal bypass grafts. She had to go back to the operating room the following day for acute left leg ischemia and had a left popliteal and tibioperoneal trunk endarterectomy with lesser saphenous vein patch.  She then was found to have occlusion of her left femoral-popliteal bypass graft.  As a last ditch effort, she underwent a left femoral to posterior tibial artery bypass graft with cryopreserved vein.  This was done on 03/27/2018.  It is occluded in the perioperative.  And she had to get back for thrombectomy.  There was no obvious reason for occlusion.  She was ultimately able to be discharged home.  CURRENT MEDICATIONS:    Current Outpatient Medications  Medication Sig Dispense Refill  . albuterol (PROVENTIL) 2 MG tablet Take 2 mg by mouth 3 (three) times daily as needed for shortness of breath.     Marland Kitchen aspirin EC 81 MG tablet Take 81 mg by mouth every evening.    . cetirizine (ZYRTEC) 10 MG tablet Take 10 mg by mouth daily.     Marland Kitchen doxycycline (VIBRA-TABS) 100 MG tablet Take 1 tablet (100 mg total) by mouth 2 (two) times daily. (Patient taking differently: Take 100 mg by mouth daily. ) 60 tablet 4   . HUMALOG KWIKPEN 100 UNIT/ML KiwkPen Inject 10 Units into the skin See admin instructions. 10 units three times a day before meals per sliding scale    . HYDROcodone-acetaminophen (NORCO) 10-325 MG tablet Take 1 tablet by mouth 2 (two) times daily as needed for severe pain.     Marland Kitchen insulin glargine (LANTUS) 100 UNIT/ML injection Inject 0.32 mLs (32 Units total) into the skin 2 (two) times daily. 10 mL 11  . LANTUS SOLOSTAR 100 UNIT/ML Solostar Pen Inject 36 Units into the skin 2 (two) times daily.     Marland Kitchen loperamide (IMODIUM A-D) 2 MG tablet Take 12 mg by mouth daily as needed for diarrhea or loose stools.    Marland Kitchen omeprazole (PRILOSEC) 20 MG capsule Take 20 mg by mouth daily.    . ondansetron (ZOFRAN-ODT) 4 MG disintegrating tablet DISSOLVE 1 TABLET IN MOUTH EVERY 8 HOURS AS NEEDED FOR NAUSEA OR VOMITING 20 tablet 0  . pregabalin (LYRICA) 100 MG capsule Limit 1 tab by mouth twice a day to 3 times a day if tolerated (Patient taking differently: Take 100 mg by mouth 3 (three) times daily. ) 90 capsule 0  . rivaroxaban (XARELTO) 20 MG TABS tablet Take 1 tablet (20 mg total) by mouth daily with supper. 30 tablet 2  . rosuvastatin (CRESTOR) 20 MG tablet Take 1 tablet (20 mg total) by mouth daily at 6 PM. 30 tablet 2  . metoprolol tartrate (LOPRESSOR) 25 MG tablet Take 0.5 tablets (12.5 mg total) by mouth 2 (two) times daily. 90 tablet 3   No current facility-administered medications  for this visit.     REVIEW OF SYSTEMS:   [X]  denotes positive finding, [ ]  denotes negative finding Cardiac  Comments:  Chest pain or chest pressure:    Shortness of breath upon exertion:    Short of breath when lying flat:    Irregular heart rhythm:    Constitutional    Fever or chills:      PHYSICAL EXAM:   Vitals:   04/20/18 1620  BP: (!) 73/43  Pulse: (!) 110  Resp: 17  Temp: (!) 102.2 F (39 C)  TempSrc: Oral  SpO2: 95%  Weight: 160 lb (72.6 kg)  Height: 5\' 7"  (1.702 m)    GENERAL: The patient is a  well-nourished female, in no acute distress. The vital signs are documented above. CARDIOVASCULAR: There is a regular rate and rhythm. PULMONARY: Non-labored respirations No Doppler signal within left foot.  She has a brisk popliteal signal Dry gangrene to the left foot  STUDIES:      MEDICAL ISSUES:   The patient is febrile today and hypotensive.  She also has had occlusion of her bypass graft.  I recommended that she go to the emergency department however she is going camping and does not want to miss this.  Therefore I gave her a prescription for clindamycin for 5 days as she is already on Bactrim.  I have her scheduled to come back in a few weeks for repeat wound check.  I told her that she does not have any other options for revascularization and will need a below-knee versus above-knee amputation in the immediate future.  She would like to delay this as long as possible.  [name], MD [facility] [phone] Pager [phone]

## 2018-04-24 ENCOUNTER — Encounter (HOSPITAL_COMMUNITY): Payer: Self-pay | Admitting: Emergency Medicine

## 2018-04-24 ENCOUNTER — Inpatient Hospital Stay (HOSPITAL_COMMUNITY)
Admission: EM | Admit: 2018-04-24 | Discharge: 2018-05-22 | DRG: 853 | Disposition: A | Discharge status: Rehabilitation Facility | Payer: Medicare Other | Attending: Internal Medicine | Admitting: Internal Medicine

## 2018-04-24 ENCOUNTER — Emergency Department (HOSPITAL_COMMUNITY): Payer: Medicare Other

## 2018-04-24 DIAGNOSIS — R6521 Severe sepsis with septic shock: Secondary | ICD-10-CM | POA: Diagnosis present

## 2018-04-24 DIAGNOSIS — E1169 Type 2 diabetes mellitus with other specified complication: Secondary | ICD-10-CM | POA: Diagnosis present

## 2018-04-24 DIAGNOSIS — I998 Other disorder of circulatory system: Secondary | ICD-10-CM | POA: Diagnosis present

## 2018-04-24 DIAGNOSIS — Z794 Long term (current) use of insulin: Secondary | ICD-10-CM

## 2018-04-24 DIAGNOSIS — I739 Peripheral vascular disease, unspecified: Secondary | ICD-10-CM | POA: Diagnosis present

## 2018-04-24 DIAGNOSIS — E1142 Type 2 diabetes mellitus with diabetic polyneuropathy: Secondary | ICD-10-CM

## 2018-04-24 DIAGNOSIS — E785 Hyperlipidemia, unspecified: Secondary | ICD-10-CM

## 2018-04-24 DIAGNOSIS — G9341 Metabolic encephalopathy: Secondary | ICD-10-CM | POA: Diagnosis not present

## 2018-04-24 DIAGNOSIS — E2740 Unspecified adrenocortical insufficiency: Secondary | ICD-10-CM | POA: Diagnosis not present

## 2018-04-24 DIAGNOSIS — I5041 Acute combined systolic (congestive) and diastolic (congestive) heart failure: Secondary | ICD-10-CM

## 2018-04-24 DIAGNOSIS — I480 Paroxysmal atrial fibrillation: Secondary | ICD-10-CM | POA: Diagnosis present

## 2018-04-24 DIAGNOSIS — M6282 Rhabdomyolysis: Secondary | ICD-10-CM | POA: Diagnosis present

## 2018-04-24 DIAGNOSIS — R4189 Other symptoms and signs involving cognitive functions and awareness: Secondary | ICD-10-CM | POA: Diagnosis present

## 2018-04-24 DIAGNOSIS — Z9049 Acquired absence of other specified parts of digestive tract: Secondary | ICD-10-CM

## 2018-04-24 DIAGNOSIS — Z87891 Personal history of nicotine dependence: Secondary | ICD-10-CM

## 2018-04-24 DIAGNOSIS — N170 Acute kidney failure with tubular necrosis: Secondary | ICD-10-CM

## 2018-04-24 DIAGNOSIS — K219 Gastro-esophageal reflux disease without esophagitis: Secondary | ICD-10-CM

## 2018-04-24 DIAGNOSIS — Z8349 Family history of other endocrine, nutritional and metabolic diseases: Secondary | ICD-10-CM

## 2018-04-24 DIAGNOSIS — I2510 Atherosclerotic heart disease of native coronary artery without angina pectoris: Secondary | ICD-10-CM | POA: Diagnosis present

## 2018-04-24 DIAGNOSIS — D62 Acute posthemorrhagic anemia: Secondary | ICD-10-CM

## 2018-04-24 DIAGNOSIS — D638 Anemia in other chronic diseases classified elsewhere: Secondary | ICD-10-CM

## 2018-04-24 DIAGNOSIS — E1165 Type 2 diabetes mellitus with hyperglycemia: Secondary | ICD-10-CM | POA: Diagnosis not present

## 2018-04-24 DIAGNOSIS — M79605 Pain in left leg: Secondary | ICD-10-CM | POA: Diagnosis not present

## 2018-04-24 DIAGNOSIS — E1140 Type 2 diabetes mellitus with diabetic neuropathy, unspecified: Secondary | ICD-10-CM | POA: Diagnosis present

## 2018-04-24 DIAGNOSIS — Z8249 Family history of ischemic heart disease and other diseases of the circulatory system: Secondary | ICD-10-CM

## 2018-04-24 DIAGNOSIS — I4891 Unspecified atrial fibrillation: Secondary | ICD-10-CM

## 2018-04-24 DIAGNOSIS — Z91011 Allergy to milk products: Secondary | ICD-10-CM

## 2018-04-24 DIAGNOSIS — Z9289 Personal history of other medical treatment: Secondary | ICD-10-CM

## 2018-04-24 DIAGNOSIS — G8929 Other chronic pain: Secondary | ICD-10-CM | POA: Diagnosis present

## 2018-04-24 DIAGNOSIS — G9340 Encephalopathy, unspecified: Secondary | ICD-10-CM

## 2018-04-24 DIAGNOSIS — Z01818 Encounter for other preprocedural examination: Secondary | ICD-10-CM

## 2018-04-24 DIAGNOSIS — Z79891 Long term (current) use of opiate analgesic: Secondary | ICD-10-CM

## 2018-04-24 DIAGNOSIS — E872 Acidosis: Secondary | ICD-10-CM | POA: Diagnosis not present

## 2018-04-24 DIAGNOSIS — D509 Iron deficiency anemia, unspecified: Secondary | ICD-10-CM | POA: Diagnosis present

## 2018-04-24 DIAGNOSIS — A419 Sepsis, unspecified organism: Secondary | ICD-10-CM | POA: Diagnosis not present

## 2018-04-24 DIAGNOSIS — Z95828 Presence of other vascular implants and grafts: Secondary | ICD-10-CM

## 2018-04-24 DIAGNOSIS — Z833 Family history of diabetes mellitus: Secondary | ICD-10-CM

## 2018-04-24 DIAGNOSIS — L89313 Pressure ulcer of right buttock, stage 3: Secondary | ICD-10-CM | POA: Diagnosis present

## 2018-04-24 DIAGNOSIS — L8990 Pressure ulcer of unspecified site, unspecified stage: Secondary | ICD-10-CM | POA: Diagnosis present

## 2018-04-24 DIAGNOSIS — R570 Cardiogenic shock: Secondary | ICD-10-CM | POA: Diagnosis not present

## 2018-04-24 DIAGNOSIS — S31829D Unspecified open wound of left buttock, subsequent encounter: Secondary | ICD-10-CM

## 2018-04-24 DIAGNOSIS — R4702 Dysphasia: Secondary | ICD-10-CM | POA: Diagnosis not present

## 2018-04-24 DIAGNOSIS — K0889 Other specified disorders of teeth and supporting structures: Secondary | ICD-10-CM | POA: Diagnosis present

## 2018-04-24 DIAGNOSIS — Z7901 Long term (current) use of anticoagulants: Secondary | ICD-10-CM

## 2018-04-24 DIAGNOSIS — J9690 Respiratory failure, unspecified, unspecified whether with hypoxia or hypercapnia: Secondary | ICD-10-CM

## 2018-04-24 DIAGNOSIS — Z6830 Body mass index (BMI) 30.0-30.9, adult: Secondary | ICD-10-CM

## 2018-04-24 DIAGNOSIS — K029 Dental caries, unspecified: Secondary | ICD-10-CM | POA: Diagnosis present

## 2018-04-24 DIAGNOSIS — I214 Non-ST elevation (NSTEMI) myocardial infarction: Secondary | ICD-10-CM

## 2018-04-24 DIAGNOSIS — J45909 Unspecified asthma, uncomplicated: Secondary | ICD-10-CM | POA: Diagnosis present

## 2018-04-24 DIAGNOSIS — Z9089 Acquired absence of other organs: Secondary | ICD-10-CM

## 2018-04-24 DIAGNOSIS — I5043 Acute on chronic combined systolic (congestive) and diastolic (congestive) heart failure: Secondary | ICD-10-CM | POA: Diagnosis present

## 2018-04-24 DIAGNOSIS — Z79899 Other long term (current) drug therapy: Secondary | ICD-10-CM

## 2018-04-24 DIAGNOSIS — H5089 Other specified strabismus: Secondary | ICD-10-CM | POA: Diagnosis present

## 2018-04-24 DIAGNOSIS — Z789 Other specified health status: Secondary | ICD-10-CM

## 2018-04-24 DIAGNOSIS — J9601 Acute respiratory failure with hypoxia: Secondary | ICD-10-CM

## 2018-04-24 DIAGNOSIS — I952 Hypotension due to drugs: Secondary | ICD-10-CM | POA: Diagnosis not present

## 2018-04-24 DIAGNOSIS — I70362 Atherosclerosis of unspecified type of bypass graft(s) of the extremities with gangrene, left leg: Secondary | ICD-10-CM | POA: Diagnosis present

## 2018-04-24 DIAGNOSIS — I96 Gangrene, not elsewhere classified: Secondary | ICD-10-CM | POA: Diagnosis present

## 2018-04-24 DIAGNOSIS — E669 Obesity, unspecified: Secondary | ICD-10-CM | POA: Diagnosis present

## 2018-04-24 DIAGNOSIS — Z452 Encounter for adjustment and management of vascular access device: Secondary | ICD-10-CM

## 2018-04-24 DIAGNOSIS — N19 Unspecified kidney failure: Secondary | ICD-10-CM

## 2018-04-24 DIAGNOSIS — I447 Left bundle-branch block, unspecified: Secondary | ICD-10-CM | POA: Diagnosis present

## 2018-04-24 DIAGNOSIS — Z419 Encounter for procedure for purposes other than remedying health state, unspecified: Secondary | ICD-10-CM

## 2018-04-24 DIAGNOSIS — I4581 Long QT syndrome: Secondary | ICD-10-CM | POA: Diagnosis not present

## 2018-04-24 DIAGNOSIS — M545 Low back pain: Secondary | ICD-10-CM | POA: Diagnosis present

## 2018-04-24 DIAGNOSIS — I21A1 Myocardial infarction type 2: Secondary | ICD-10-CM | POA: Diagnosis not present

## 2018-04-24 DIAGNOSIS — Z792 Long term (current) use of antibiotics: Secondary | ICD-10-CM

## 2018-04-24 DIAGNOSIS — E119 Type 2 diabetes mellitus without complications: Secondary | ICD-10-CM

## 2018-04-24 DIAGNOSIS — R197 Diarrhea, unspecified: Secondary | ICD-10-CM | POA: Diagnosis present

## 2018-04-24 DIAGNOSIS — S78112A Complete traumatic amputation at level between left hip and knee, initial encounter: Secondary | ICD-10-CM

## 2018-04-24 DIAGNOSIS — J9600 Acute respiratory failure, unspecified whether with hypoxia or hypercapnia: Secondary | ICD-10-CM

## 2018-04-24 DIAGNOSIS — E876 Hypokalemia: Secondary | ICD-10-CM | POA: Diagnosis not present

## 2018-04-24 DIAGNOSIS — J811 Chronic pulmonary edema: Secondary | ICD-10-CM

## 2018-04-24 DIAGNOSIS — F05 Delirium due to known physiological condition: Secondary | ICD-10-CM | POA: Diagnosis not present

## 2018-04-24 DIAGNOSIS — N179 Acute kidney failure, unspecified: Secondary | ICD-10-CM

## 2018-04-24 DIAGNOSIS — Z7982 Long term (current) use of aspirin: Secondary | ICD-10-CM

## 2018-04-24 DIAGNOSIS — I7092 Chronic total occlusion of artery of the extremities: Secondary | ICD-10-CM | POA: Diagnosis present

## 2018-04-24 DIAGNOSIS — E1152 Type 2 diabetes mellitus with diabetic peripheral angiopathy with gangrene: Secondary | ICD-10-CM | POA: Diagnosis present

## 2018-04-24 DIAGNOSIS — H5461 Unqualified visual loss, right eye, normal vision left eye: Secondary | ICD-10-CM | POA: Diagnosis present

## 2018-04-24 DIAGNOSIS — I255 Ischemic cardiomyopathy: Secondary | ICD-10-CM | POA: Diagnosis present

## 2018-04-24 HISTORY — DX: Personal history of other medical treatment: Z92.89

## 2018-04-24 HISTORY — DX: Failed or difficult intubation, initial encounter: T88.4XXA

## 2018-04-24 HISTORY — DX: Abnormal result of other cardiovascular function study: R94.39

## 2018-04-24 HISTORY — DX: Atherosclerotic heart disease of native coronary artery without angina pectoris: I25.10

## 2018-04-24 MED ORDER — PIPERACILLIN-TAZOBACTAM 3.375 G IVPB
3.3750 g | Freq: Three times a day (TID) | INTRAVENOUS | Status: DC
  Filled 2018-04-24: qty 50

## 2018-04-24 MED ORDER — SODIUM CHLORIDE 0.9 % IV BOLUS (SEPSIS)
1000.0000 mL | Freq: Once | INTRAVENOUS | Status: AC
  Administered 2018-04-24: 1000 mL via INTRAVENOUS

## 2018-04-24 MED ORDER — VANCOMYCIN HCL IN DEXTROSE 1-5 GM/200ML-% IV SOLN
1000.0000 mg | Freq: Once | INTRAVENOUS | Status: AC
  Administered 2018-04-24: 1000 mg via INTRAVENOUS
  Filled 2018-04-24: qty 200

## 2018-04-24 MED ORDER — VANCOMYCIN HCL IN DEXTROSE 1-5 GM/200ML-% IV SOLN: 1000.0000 mg | Freq: Two times a day (BID) | INTRAVENOUS | Status: DC

## 2018-04-24 MED ORDER — SODIUM CHLORIDE 0.9 % IV BOLUS (SEPSIS)
250.0000 mL | Freq: Once | INTRAVENOUS | Status: AC
  Administered 2018-04-24: 250 mL via INTRAVENOUS

## 2018-04-24 MED ORDER — ACETAMINOPHEN 325 MG PO TABS: 650.0000 mg | ORAL_TABLET | Freq: Once | ORAL | Status: DC

## 2018-04-24 MED ORDER — PIPERACILLIN-TAZOBACTAM 3.375 G IVPB 30 MIN
3.3750 g | Freq: Once | INTRAVENOUS | Status: AC
  Administered 2018-04-24: 3.375 g via INTRAVENOUS
  Filled 2018-04-24: qty 50

## 2018-04-24 NOTE — ED Notes (Signed)
Please note that pt has a significant self care deficit.  Pt arrives to ED with plenty of dried liquid stool on her.  Stool evident on her hands, down both legs and significant dried stool at the bottom of her right foot.  Pt has foul smelling wound to left foot, wound oozing on outside of foot with black great toe.  Pt also has oozing spot from left shin and reports soreness to buttock.  Shortly after pt was seen by EDP pt is incontinent of watery stool.  Pt cleaned up and found that she has excoriated buttock with pressure sore redness.  Pt also has a mepilex type dressing to buttock which I did not remove.  Pt given brief bedbath, focused on removing dried stool and cleaning her up of the new stool. Placed flexiseal to protect pt skin and pre-existing wounds, placed barrier cream on buttock and in gluteal fold

## 2018-04-24 NOTE — Progress Notes (Signed)
Pharmacy Antibiotic Note  [name] is a 52 y.o. female admitted on 04/24/2018 with L foot infection.  Pharmacy has been consulted for vancomycin and Zosyn dosing.  Patient has received 1 time doses of vancomycin 1g and Zosyn 3.375g in the ED as part of sepsis protocol. tmax in the ED 101.6, SCr 1.07 in May with est CrCL ~48mL/min  Plan: Zosyn 3.375g IV q8h EI Vancomycin 1g IV q12h Follow c/s, clinical progression, renal function, level PRN, de-escalation/LOT  Height: 5\' 7"  (170.2 cm) Weight: 160 lb (72.6 kg) IBW/kg (Calculated) : 61.6  Temp (24hrs), Avg:100.9 F (38.3 C), Min:100.1 F (37.8 C), Max:101.6 F (38.7 C)  No results for input(s): WBC, CREATININE, LATICACIDVEN, VANCOTROUGH, VANCOPEAK, VANCORANDOM, GENTTROUGH, GENTPEAK, GENTRANDOM, TOBRATROUGH, TOBRAPEAK, TOBRARND, AMIKACINPEAK, AMIKACINTROU, AMIKACIN in the last 168 hours.  CrCl cannot be calculated (Patient's most recent lab result is older than the maximum 21 days allowed.).    No Known Allergies  Antimicrobials this admission: Vancomycin 6/7 >>  Zosyn 6/7 >>   Dose adjustments this admission: n/a  Microbiology results: 6/7 BCx:    Thank you for allowing pharmacy to be a part of this patient's care.  Verenice Westrich D. Mariabelen Pressly, PharmD, BCPS Clinical Pharmacist [phone] Please check AMION for all [place] numbers 04/24/2018 10:07 PM

## 2018-04-24 NOTE — ED Triage Notes (Signed)
Per family pt has continued to decline since seeing MD this week, pt reports she has been taking antibiotics but family reports confusion, pain to L leg and pain to "bottom". Per family BP is baseline for patient. Family reports emesis and diarrhea with decreased appetite x 3 days. Family states odor now present from L foot with increased redness.  Pt also c/o neck pain, denies injury.

## 2018-04-24 NOTE — ED Notes (Signed)
Unable to do bldcu x2 with Kurin, used syringes. Pt difficult stick

## 2018-04-24 NOTE — ED Provider Notes (Signed)
Patient placed in Quick Look pathway, seen and evaluated   Chief Complaint: L foot infection  HPI:   52 year old with multiple medical problems presents with 5 days of worsening confusion, fevers, N/V/D, and a worsening L foot infection. The patient saw Vascular earlier in the week and she was noted to have a fever of 102 and was hypotensive then. She was instructed to come to the ED but she did not want to and wanted to go camping instead. Her husband is at bedside and has been helping care for her. He states her foot has been worsening and is more malodorous and blue. Dr. Trula Slade recommended amputation at the last office visit.  ROS: +confusion, fever, L foot pain  Physical Exam:   Gen: No distress. Ill appearing  Neuro: Lethargic  Skin: Warm    Focused Exam: Heart: Regular rate and rhythm    Lungs: CTA    Abdomen: Soft, non-tender    L foot: Gangrene of the great toe and lateral aspect of the foot. It is malodorous with drainage. Pedal pulses are not palpable.  Plan: Sepsis protocol initiated  Initiation of care has begun. The patient has been counseled on the process, plan, and necessity for staying for the completion/evaluation, and the remainder of the medical screening examination    Recardo Evangelist, PA-C 04/24/18 2054    Margette Fast, MD 04/25/18 [phone]

## 2018-04-24 NOTE — ED Provider Notes (Signed)
[facility] EMERGENCY DEPARTMENT Provider Note   CSN: [id number] Arrival date & time: 04/24/18  2000   History   Chief Complaint Chief Complaint  Patient presents with  . Leg Pain    HPI [name] is a 52 y.o. female.  The history is provided by the patient. The history is limited by the condition of the patient.   52 yo F with PMHx of DM, PAD s/p left femoral to posterior tibial artery bypass graft (03/27/18, followed by vascular surgery) who presents with worsening LLE pain for multiple days. Unable to classify pain secondary to altered mental status, husband states worsening. Now accompanied by confusion, worsening for past 3 days, fever, and malodorous drainage from foot. Was seen by vascular surgery 4 days ago, found to have pulseless left lower extremity with necrosis concerning at that time for dry gangrene, advised to go to ED for likely amputation. Pain accompanied by fever.   Past Medical History:  Diagnosis Date  . Arthritis   . Asthma   . Chronic back pain   . Depression   . Diabetes mellitus   . Diabetic neuropathy ([place])   . Family history of adverse reaction to anesthesia    mother had difficlty waking   . Femoral-popliteal bypass graft occlusion, left ([place]) 12/02/2017  . GERD (gastroesophageal reflux disease)   . Hyperlipidemia   . Osteomyelitis of right fibula ([place]) 03/05/2017  . PAD (peripheral artery disease) ([place])   . Paroxysmal atrial fibrillation with rapid ventricular response ([place]) 12/02/2017  . Ulcer    Foot    Patient Active Problem List   Diagnosis Date Noted  . 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 Respiratory failure with hypoxia ([place]) 12/02/2017  . Leukocytosis 12/02/2017  . Paroxysmal atrial fibrillation with rapid ventricular response ([place]) 12/02/2017  . Thrombosis 11/24/2017  . Hyperkalemia 06/26/2017  . Atherosclerosis of native artery of right lower extremity with ulceration of ankle  ([place]) 05/13/2017  . Osteomyelitis of right fibula ([place]) 03/05/2017  . Non-pressure chronic ulcer of right ankle limited to breakdown of skin ([place]) 01/17/2017  . Elevated lactic acid level   . 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 Chronic pain 12/24/2016  . Buttock wound, left, subsequent encounter   . Hypotension 12/23/2016  . IDDM (insulin dependent diabetes mellitus) ([place]) 12/23/2016  . AKI (acute kidney injury) ([place]) 12/23/2016  . Malnutrition ([place]) 12/23/2016  . Thrush 12/23/2016  . Septic shock ([place]) 11/27/2016  . DM2 (diabetes mellitus, type 2) ([place]) 11/27/2016  . Chronic back pain 11/27/2016  . Orthostatic hypotension 11/27/2016  . Demand ischemia ([place])   . Hyperlipidemia LDL goal <70   . Right sided weakness   . Retinal tumor of right eye   . Pressure injury of skin 10/01/2016  . Necrotizing fasciitis ([place]) 09/30/2016  . 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 Paresthesia of both hands 03/05/2016  . DDD (degenerative disc disease), lumbosacral 03/26/2015  . DDD (degenerative disc disease), cervical 03/26/2015  . Sacroiliac joint disease 03/26/2015  . Occipital neuralgia 03/26/2015  . Pain in limb-Bilateral Leg 04/25/2014  . PAD (peripheral artery disease) ([place]) 04/05/2013  . Chronic total occlusion of artery of the extremities ([place]) 02/10/2012  . Peripheral vascular disease, unspecified ([place]) 02/10/2012    Past Surgical History:  Procedure Laterality Date  . ABDOMINAL AORTAGRAM  June 15, 2014  . ABDOMINAL AORTAGRAM N/A 06/15/2014   Procedure: ABDOMINAL Maxcine Ham;  Surgeon: Serafina Mitchell, MD;  Location[facility] CATH  LAB;  Service: Cardiovascular;  Laterality: N/A;  . ABDOMINAL AORTAGRAM N/A 11/22/2014   Procedure: ABDOMINAL AORTAGRAM;  Surgeon: Serafina Mitchell, MD;  Location: [facility] CATH LAB;  Service: Cardiovascular;  Laterality: N/A;  . ABDOMINAL AORTOGRAM W/LOWER EXTREMITY N/A 01/07/2017   Procedure: Abdominal Aortogram w/Lower Extremity;  Surgeon: Serafina Mitchell, MD;  Location: [place] CV LAB;  Service: Cardiovascular;  Laterality: N/A;  . ABDOMINAL AORTOGRAM W/LOWER EXTREMITY N/A 10/31/2017   Procedure: ABDOMINAL AORTOGRAM W/LOWER EXTREMITY;  Surgeon: Elam Dutch, MD;  Location: [place] CV LAB;  Service: Cardiovascular;  Laterality: N/A;  . ABDOMINAL AORTOGRAM W/LOWER EXTREMITY N/A 03/24/2018   Procedure: ABDOMINAL AORTOGRAM W/LOWER EXTREMITY;  Surgeon: Serafina Mitchell, MD;  Location: [place] CV LAB;  Service: Cardiovascular;  Laterality: N/A;  . AORTA - BILATERAL FEMORAL ARTERY BYPASS GRAFT N/A 11/28/2017   Procedure: AORTA BIFEMORAL BYPASS USING HEMASHIELD GOLD GRAFT & REIMPLANT IMA;  Surgeon: Serafina Mitchell, MD;  Location: [facility] OR;  Service: Vascular;  Laterality: N/A;  . AORTIC ARCH ANGIOGRAPHY N/A 10/31/2017   Procedure: AORTIC ARCH ANGIOGRAPHY;  Surgeon: Elam Dutch, MD;  Location: [place] CV LAB;  Service: Cardiovascular;  Laterality: N/A;  . APPLICATION OF WOUND VAC  11/28/2017   Procedure: APPLICATION OF WOUND VAC;  Surgeon: Serafina Mitchell, MD;  Location: [facility] OR;  Service: Vascular;;  . APPLICATION OF WOUND VAC Left 03/27/2018   Procedure: APPLICATION OF WOUND VAC LEFT GROIN USING PREVENA PLUS;  Surgeon: Serafina Mitchell, MD;  Location: [place];  Service: Vascular;  Laterality: Left;  . ARTERIAL BYPASS SURGRY  07/05/2010   Right Common Femoral to below knee popliteal BPG  . BACK SURGERY     X's  2  . CARDIAC CATHETERIZATION    . CHOLECYSTECTOMY     Gall Bladder  . CYSTECTOMY Right    foot  . CYSTECTOMY Left    wrist  . EMBOLECTOMY Left 11/28/2017   Procedure: Left Lower Extremity Embolectomy, Left Tibial Peroneal Trunk Endarterectomy with Patch Angioplasty; Vein Harvest Small Saphenous Graft Left Lower Leg;  Surgeon: [name], MD;  Location: [facility];  Service: Vascular;  Laterality: Left;  . FEMORAL-POPLITEAL BYPASS GRAFT Left 03/27/2018   Procedure: THROMBECTOMY OF LEFT FEMORAL TIBIAL BYPASS;   Surgeon: Serafina Mitchell, MD;  Location: [place];  Service: Vascular;  Laterality: Left;  . FEMORAL-TIBIAL BYPASS GRAFT Left 03/27/2018   Procedure: BYPASS GRAFT FEMORAL-TIBIAL ARTERY LEFT REDO USING CRYOPRESERVED SAPHENOUS VEIN 70cm;  Surgeon: Serafina Mitchell, MD;  Location: [facility] OR;  Service: Vascular;  Laterality: Left;  . INTERCOSTAL NERVE BLOCK  November 2015  . INTRAOPERATIVE ARTERIOGRAM  11/28/2017   Procedure: INTRA OPERATIVE ARTERIOGRAM OF LEFT LEG;  Surgeon: Serafina Mitchell, MD;  Location: [place];  Service: Vascular;;  . INTRAOPERATIVE ARTERIOGRAM Left 03/27/2018   Procedure: INTRA OPERATIVE ARTERIOGRAM TIMES TWO;  Surgeon: Serafina Mitchell, MD;  Location: [place];  Service: Vascular;  Laterality: Left;  . IR FLUORO GUIDE CV LINE RIGHT  03/20/2017  . IR US GUIDE VASC ACCESS RIGHT  03/20/2017  . IRRIGATION AND DEBRIDEMENT BUTTOCKS Right 09/30/2016   Procedure: DEBRIDEMENT RIGHT  BUTTOCK WOUND;  Surgeon: Georganna Skeans, MD;  Location: [place];  Service: General;  Laterality: Right;  . left foot surgery    . left wrist cyst removal Left   . PERIPHERAL VASCULAR CATHETERIZATION N/A 05/07/2016   Procedure: Abdominal Aortogram;  Surgeon: Serafina Mitchell, MD;  Location: [place] CV LAB;  Service: Cardiovascular;  Laterality: N/A;  . PERIPHERAL VASCULAR CATHETERIZATION N/A 05/07/2016   Procedure: Lower Extremity Angiography;  Surgeon: Serafina Mitchell, MD;  Location: [place] CV LAB;  Service: Cardiovascular;  Laterality: N/A;  . PERIPHERAL VASCULAR CATHETERIZATION N/A 05/07/2016   Procedure: Aortic Arch Angiography;  Surgeon: Serafina Mitchell, MD;  Location: [place] CV LAB;  Service: Cardiovascular;  Laterality: N/A;  . PERIPHERAL VASCULAR CATHETERIZATION N/A 05/07/2016   Procedure: Upper Extremity Angiography;  Surgeon: Serafina Mitchell, MD;  Location: [place] CV LAB;  Service: Cardiovascular;  Laterality: N/A;  . PERIPHERAL VASCULAR CATHETERIZATION Right 05/07/2016   Procedure: Peripheral  Vascular Balloon Angioplasty;  Surgeon: Serafina Mitchell, MD;  Location: [place] CV LAB;  Service: Cardiovascular;  Laterality: Right;  subclavian  . PERIPHERAL VASCULAR CATHETERIZATION Right 05/07/2016   Procedure: Peripheral Vascular Intervention;  Surgeon: Serafina Mitchell, MD;  Location: [place] CV LAB;  Service: Cardiovascular;  Laterality: Right;  External  Iliac  . SKIN GRAFT Right 2012   [name] by Dr. Nils Pyle- Right and Left Ankle  . SPINE SURGERY    . THROMBECTOMY FEMORAL ARTERY  11/28/2017   Procedure: THROMBECTOMY  & REVISION OF BILATERAL FEMORAL TO POPLETEAL ARTERIES;  Surgeon: Serafina Mitchell, MD;  Location: [facility] OR;  Service: Vascular;;  . TONSILLECTOMY       OB History   None      Home Medications    Prior to Admission medications   Medication Sig Start Date End Date Taking? Authorizing Provider  albuterol (PROVENTIL) 2 MG tablet Take 2 mg by mouth 3 (three) times daily as needed for shortness of breath.    Yes [provider]  aspirin EC 81 MG tablet Take 81 mg by mouth every evening.   Yes [provider]  cetirizine (ZYRTEC) 10 MG tablet Take 10 mg by mouth daily.    Yes [provider]  clindamycin (CLEOCIN) 300 MG capsule Take 1 capsule (300 mg total) by mouth 3 (three) times daily. 04/20/18  Yes Ulyses Amor, PA-C  doxycycline (VIBRA-TABS) 100 MG tablet Take 1 tablet (100 mg total) by mouth 2 (two) times daily. Patient taking differently: Take 100 mg by mouth daily.  01/26/18  Yes [name], Lavell Islam, MD  HUMALOG KWIKPEN 100 UNIT/ML KiwkPen Inject 10 Units into the skin See admin instructions. 10 units three times a day before meals per sliding scale 11/11/15  Yes [provider]  HYDROcodone-acetaminophen (NORCO) 10-325 MG tablet Take 1 tablet by mouth 2 (two) times daily as needed for severe pain.  01/07/18  Yes [provider]  LANTUS SOLOSTAR 100 UNIT/ML Solostar Pen Inject 36 Units into the skin 2 (two) times daily.   09/15/14  Yes [provider]  loperamide (IMODIUM A-D) 2 MG tablet Take 12 mg by mouth daily as needed for diarrhea or loose stools.   Yes [provider]  metoprolol tartrate (LOPRESSOR) 25 MG tablet Take 0.5 tablets (12.5 mg total) by mouth 2 (two) times daily. 01/14/18 04/24/18 Yes End, Harrell Gave, MD  omeprazole (PRILOSEC) 20 MG capsule Take 20 mg by mouth daily.   Yes [provider]  ondansetron (ZOFRAN-ODT) 4 MG disintegrating tablet DISSOLVE 1 TABLET IN MOUTH EVERY 8 HOURS AS NEEDED FOR NAUSEA OR VOMITING 04/02/18  Yes [name], MD  pregabalin (LYRICA) 100 MG capsule Limit 1 tab by mouth twice a day to 3 times a day if tolerated Patient taking differently: Take 100 mg by mouth 2 (  two) times daily.  07/18/16  Yes Mohammed Kindle, MD  rivaroxaban (XARELTO) 20 MG TABS tablet Take 1 tablet (20 mg total) by mouth daily with supper. 04/08/18  Yes Ulyses Amor, PA-C  rosuvastatin (CRESTOR) 20 MG tablet Take 1 tablet (20 mg total) by mouth daily at 6 PM. 12/09/17  Yes Ulyses Amor, PA-C    Family History Family History  Problem Relation Age of Onset  . Coronary artery disease Mother   . Peripheral vascular disease Mother   . Heart disease Mother        Before age 67  . Other Mother        Venous insuffiency  . Diabetes Mother   . Hyperlipidemia Mother   . Hypertension Mother   . Varicose Veins Mother   . Heart attack Mother        before age 60  . Heart disease Father   . Diabetes Father   . Diabetes Maternal Grandmother   . Diabetes Paternal Grandmother   . Diabetes Paternal Grandfather   . Diabetes Sister   . Hypertension Sister   . Diabetes Brother   . Hypertension Brother     Social History Social History   Tobacco Use  . Smoking status: Former Smoker    Years: 30.00    Types: Cigarettes    Last attempt to quit: 02/07/2017    Years since quitting: 1.2  . Smokeless tobacco: Never Used  Substance Use Topics  . Alcohol use:  No    Alcohol/week: 0.0 oz  . Drug use: No     Allergies   Patient has no known allergies.   Review of Systems Review of Systems  Constitutional: Positive for fever.  HENT: Negative for ear pain and sore throat.   Eyes: Negative for pain.  Respiratory: Negative for cough and shortness of breath.   Cardiovascular: Positive for leg swelling. Negative for palpitations.  Gastrointestinal: Negative for abdominal pain and vomiting.  Genitourinary: Negative for flank pain.  Musculoskeletal: Negative for arthralgias and back pain.  Skin: Positive for color change and wound. Negative for rash.  Neurological: Negative for numbness.  Psychiatric/Behavioral: Positive for confusion.  All other systems reviewed and are negative.    Physical Exam Updated Vital Signs BP 116/62   Pulse 98   Temp (!) 101.6 F (38.7 C) (Oral)   Resp 15   Ht 5\' 7"  (1.702 m)   Wt 72.6 kg (160 lb)   SpO2 (!) 88%   BMI 25.06 kg/m   Physical Exam  Constitutional: She appears well-developed. No distress.  HENT:  Head: Normocephalic and atraumatic.  Mouth/Throat: Mucous membranes are dry.  Eyes: Conjunctivae and EOM are normal.  Neck: Neck supple.  Cardiovascular: Regular rhythm. Tachycardia present.  No murmur heard. Pulmonary/Chest: Effort normal and breath sounds normal. No stridor. No respiratory distress. She has no rales.  Abdominal: Soft. She exhibits no distension. There is no tenderness.  Musculoskeletal: She exhibits edema and tenderness.  LLE: necrotic great toe, fourth toe, lateral left foot with maggots, erythema to level of knee with edema and tenderness, no palpable pulse in foot  Neurological: She is alert. She is disoriented.  Skin: Skin is warm and dry. There is erythema (see MSK).  Nursing note and vitals reviewed.    ED Treatments / Results  Labs (all labs ordered are listed, but only abnormal results are displayed) Labs Reviewed  COMPREHENSIVE METABOLIC PANEL - Abnormal;  Notable for the following components:  Result Value   Sodium 130 (*)    Chloride 97 (*)    CO2 19 (*)    Glucose, Bld 147 (*)    BUN 60 (*)    Creatinine, Ser 8.12 (*)    Calcium 7.2 (*)    Total Protein 6.1 (*)    Albumin 1.8 (*)    ALT 10 (*)    Alkaline Phosphatase 131 (*)    GFR calc non Af Amer 5 (*)    GFR calc Af Amer 6 (*)    All other components within normal limits  CBC WITH DIFFERENTIAL/PLATELET - Abnormal; Notable for the following components:   WBC 19.6 (*)    RBC 3.09 (*)    Hemoglobin 7.3 (*)    HCT 23.4 (*)    MCV 75.7 (*)    MCH 23.6 (*)    RDW 17.3 (*)    Neutro Abs 17.1 (*)    Monocytes Absolute 1.4 (*)    Abs Immature Granulocytes 0.2 (*)    All other components within normal limits  CULTURE, BLOOD (ROUTINE X 2)  CULTURE, BLOOD (ROUTINE X 2)  URINALYSIS, ROUTINE W REFLEX MICROSCOPIC  CORTISOL  PROCALCITONIN  BASIC METABOLIC PANEL  CBC  MAGNESIUM  PHOSPHORUS  HIV ANTIBODY (ROUTINE TESTING)  TROPONIN I  TROPONIN I  TROPONIN I  I-STAT CG4 LACTIC ACID, ED  I-STAT CG4 LACTIC ACID, ED    EKG EKG Interpretation  Date/Time:  Saturday April 25 2018 00:07:40 EDT Ventricular Rate:  104 PR Interval:    QRS Duration: 107 QT Interval:  337 QTC Calculation: 444 R Axis:   88 Text Interpretation:  Sinus tachycardia Low voltage, precordial leads Baseline wander in lead(s) I II III aVR aVL V6 Rate faster wandering baseline Confirmed by Ezequiel Essex [phone]) on 04/25/2018 12:14:09 AM   Radiology Dg Chest Portable 1 View  Result Date: 04/24/2018 CLINICAL DATA:  Initial evaluation for acute sepsis. EXAM: PORTABLE CHEST 1 VIEW COMPARISON:  Prior radiograph from 12/02/2017. FINDINGS: Moderate cardiomegaly, stable. Mediastinal silhouette within normal limits. No focal infiltrates. No pulmonary edema or visible pleural effusion. No pneumothorax. Osseous structures within normal limits. IMPRESSION: 1. No active cardiopulmonary disease. 2. Stable cardiomegaly  without pulmonary edema. Electronically Signed   By: Jeannine Boga M.D.   On: 04/24/2018 22:19    Procedures Procedures (including critical care time)  Medications Ordered in ED Medications  piperacillin-tazobactam (ZOSYN) IVPB 3.375 g (has no administration in time range)  vancomycin (VANCOCIN) IVPB 1000 mg/200 mL premix (has no administration in time range)  norepinephrine (LEVOPHED) 4mg  in D5W 261mL premix infusion (10 mcg/min Intravenous New Bag/Given 04/25/18 0124)  0.9 %  sodium chloride infusion (has no administration in time range)  0.9 %  sodium chloride infusion (has no administration in time range)  0.9 %  sodium chloride infusion (has no administration in time range)  insulin aspart (novoLOG) injection 0-15 Units (has no administration in time range)  sodium chloride 0.9 % bolus 1,000 mL (has no administration in time range)  piperacillin-tazobactam (ZOSYN) IVPB 3.375 g (0 g Intravenous Stopped 04/24/18 2242)  vancomycin (VANCOCIN) IVPB 1000 mg/200 mL premix (0 mg Intravenous Stopped 04/24/18 2352)  sodium chloride 0.9 % bolus 1,000 mL (0 mLs Intravenous Stopped 04/24/18 2242)    And  sodium chloride 0.9 % bolus 1,000 mL (0 mLs Intravenous Stopped 04/25/18 0008)    And  sodium chloride 0.9 % bolus 250 mL (0 mLs Intravenous Stopped 04/24/18 2352)  sodium chloride 0.9 % bolus  1,000 mL (0 mLs Intravenous Stopped 04/25/18 0107)     Initial Impression / Assessment and Plan / ED Course  I have reviewed the triage vital signs and the nursing notes.  Pertinent labs & imaging results that were available during my care of the patient were reviewed by me and considered in my medical decision making (see chart for details).     [name] is a 52 y.o. female with PMHx of left femoral to posterior tibial artery bypass graft (03/27/18, followed by vascular surgery), a fib (on xarelto) who p/w worsening LLE pain, fever, AMS. Noted to be hypotensive, febrile, LLE with dry gangrene without  pulse 4 days ago at vascular surgeon's office. Reviewed and confirmed nursing documentation for past medical history, family history, social history. VS Temp 101.6, HR 107, BP 91/66. Exam remarkable for necrotic left foot with drainage, erythema and edema to LLE to level of knee c/w cellulitis. Clinical picture, along with AMS, concerning for sepsis secondary to gangrene.   Lactic acid 1.14. 30cc/kg NS bolus given. IV vanc, zosyn given, blood cultures pending. CMP with acute renal failure, BUN 60, Cr 8.12, hyponatremia 130, bicarb 19. CBC with leukocytosis 19.6, normocytic anemia with hgb 7.3. CXR unremarkable. Vascular surgery consulted, evaluated pt in ED. Plan for amputation in 2 days if patient is more stable, requests critical care admission.   BP responded to IVF, though worsened with systolics in 95J. Additional NS bolus given. Consulted critical care, who will evaluate patient prior to admission to unit. Awaiting eval at time of handoff. Will follow as consult.   Old records reviewed. Labs reviewed by me and used in the medical decision making.  Imaging viewed and interpreted by me and used in the medical decision making (formal interpretation from radiologist).    Final Clinical Impressions(s) / ED Diagnoses   Final diagnoses:  Sepsis, due to unspecified organism [facility])  Gangrene of left foot ([place])  Left leg pain  Septic shock ([place])  Acute renal failure, unspecified acute renal failure type [facility])    ED Discharge Orders    None       Norm Salt, MD 04/25/18 0139    Margette Fast, MD 04/25/18 [phone]

## 2018-04-24 NOTE — ED Notes (Signed)
Provider aware of Pt BP

## 2018-04-25 ENCOUNTER — Inpatient Hospital Stay (HOSPITAL_COMMUNITY): Payer: Medicare Other

## 2018-04-25 DIAGNOSIS — R0989 Other specified symptoms and signs involving the circulatory and respiratory systems: Secondary | ICD-10-CM | POA: Diagnosis not present

## 2018-04-25 DIAGNOSIS — I5021 Acute systolic (congestive) heart failure: Secondary | ICD-10-CM | POA: Diagnosis not present

## 2018-04-25 DIAGNOSIS — R6521 Severe sepsis with septic shock: Secondary | ICD-10-CM | POA: Diagnosis present

## 2018-04-25 DIAGNOSIS — I998 Other disorder of circulatory system: Secondary | ICD-10-CM | POA: Diagnosis not present

## 2018-04-25 DIAGNOSIS — I70248 Atherosclerosis of native arteries of left leg with ulceration of other part of lower left leg: Secondary | ICD-10-CM | POA: Diagnosis not present

## 2018-04-25 DIAGNOSIS — E1152 Type 2 diabetes mellitus with diabetic peripheral angiopathy with gangrene: Secondary | ICD-10-CM | POA: Diagnosis present

## 2018-04-25 DIAGNOSIS — I952 Hypotension due to drugs: Secondary | ICD-10-CM | POA: Diagnosis not present

## 2018-04-25 DIAGNOSIS — J9601 Acute respiratory failure with hypoxia: Secondary | ICD-10-CM | POA: Diagnosis not present

## 2018-04-25 DIAGNOSIS — A419 Sepsis, unspecified organism: Principal | ICD-10-CM

## 2018-04-25 DIAGNOSIS — D509 Iron deficiency anemia, unspecified: Secondary | ICD-10-CM | POA: Diagnosis present

## 2018-04-25 DIAGNOSIS — D638 Anemia in other chronic diseases classified elsewhere: Secondary | ICD-10-CM | POA: Diagnosis not present

## 2018-04-25 DIAGNOSIS — I21A1 Myocardial infarction type 2: Secondary | ICD-10-CM | POA: Diagnosis not present

## 2018-04-25 DIAGNOSIS — R579 Shock, unspecified: Secondary | ICD-10-CM | POA: Diagnosis not present

## 2018-04-25 DIAGNOSIS — N179 Acute kidney failure, unspecified: Secondary | ICD-10-CM | POA: Diagnosis not present

## 2018-04-25 DIAGNOSIS — I7092 Chronic total occlusion of artery of the extremities: Secondary | ICD-10-CM | POA: Diagnosis not present

## 2018-04-25 DIAGNOSIS — E46 Unspecified protein-calorie malnutrition: Secondary | ICD-10-CM | POA: Diagnosis not present

## 2018-04-25 DIAGNOSIS — I4891 Unspecified atrial fibrillation: Secondary | ICD-10-CM | POA: Diagnosis not present

## 2018-04-25 DIAGNOSIS — I5041 Acute combined systolic (congestive) and diastolic (congestive) heart failure: Secondary | ICD-10-CM | POA: Diagnosis not present

## 2018-04-25 DIAGNOSIS — M79605 Pain in left leg: Secondary | ICD-10-CM | POA: Diagnosis present

## 2018-04-25 DIAGNOSIS — E1165 Type 2 diabetes mellitus with hyperglycemia: Secondary | ICD-10-CM | POA: Diagnosis not present

## 2018-04-25 DIAGNOSIS — F05 Delirium due to known physiological condition: Secondary | ICD-10-CM | POA: Diagnosis not present

## 2018-04-25 DIAGNOSIS — E11319 Type 2 diabetes mellitus with unspecified diabetic retinopathy without macular edema: Secondary | ICD-10-CM | POA: Diagnosis not present

## 2018-04-25 DIAGNOSIS — R6250 Unspecified lack of expected normal physiological development in childhood: Secondary | ICD-10-CM | POA: Diagnosis not present

## 2018-04-25 DIAGNOSIS — R570 Cardiogenic shock: Secondary | ICD-10-CM | POA: Diagnosis not present

## 2018-04-25 DIAGNOSIS — I96 Gangrene, not elsewhere classified: Secondary | ICD-10-CM | POA: Diagnosis present

## 2018-04-25 DIAGNOSIS — G9341 Metabolic encephalopathy: Secondary | ICD-10-CM | POA: Diagnosis not present

## 2018-04-25 DIAGNOSIS — L89313 Pressure ulcer of right buttock, stage 3: Secondary | ICD-10-CM | POA: Diagnosis present

## 2018-04-25 DIAGNOSIS — I480 Paroxysmal atrial fibrillation: Secondary | ICD-10-CM | POA: Diagnosis present

## 2018-04-25 DIAGNOSIS — M6282 Rhabdomyolysis: Secondary | ICD-10-CM | POA: Diagnosis present

## 2018-04-25 DIAGNOSIS — E11628 Type 2 diabetes mellitus with other skin complications: Secondary | ICD-10-CM | POA: Diagnosis not present

## 2018-04-25 DIAGNOSIS — E785 Hyperlipidemia, unspecified: Secondary | ICD-10-CM | POA: Diagnosis not present

## 2018-04-25 DIAGNOSIS — E1169 Type 2 diabetes mellitus with other specified complication: Secondary | ICD-10-CM | POA: Diagnosis present

## 2018-04-25 DIAGNOSIS — N170 Acute kidney failure with tubular necrosis: Secondary | ICD-10-CM | POA: Diagnosis present

## 2018-04-25 DIAGNOSIS — E1142 Type 2 diabetes mellitus with diabetic polyneuropathy: Secondary | ICD-10-CM | POA: Diagnosis not present

## 2018-04-25 DIAGNOSIS — R5381 Other malaise: Secondary | ICD-10-CM | POA: Diagnosis not present

## 2018-04-25 DIAGNOSIS — E2740 Unspecified adrenocortical insufficiency: Secondary | ICD-10-CM | POA: Diagnosis not present

## 2018-04-25 DIAGNOSIS — I340 Nonrheumatic mitral (valve) insufficiency: Secondary | ICD-10-CM | POA: Diagnosis not present

## 2018-04-25 DIAGNOSIS — I70362 Atherosclerosis of unspecified type of bypass graft(s) of the extremities with gangrene, left leg: Secondary | ICD-10-CM | POA: Diagnosis present

## 2018-04-25 DIAGNOSIS — I5032 Chronic diastolic (congestive) heart failure: Secondary | ICD-10-CM | POA: Diagnosis not present

## 2018-04-25 DIAGNOSIS — I739 Peripheral vascular disease, unspecified: Secondary | ICD-10-CM | POA: Diagnosis not present

## 2018-04-25 DIAGNOSIS — G9340 Encephalopathy, unspecified: Secondary | ICD-10-CM | POA: Diagnosis not present

## 2018-04-25 DIAGNOSIS — Z794 Long term (current) use of insulin: Secondary | ICD-10-CM | POA: Diagnosis not present

## 2018-04-25 DIAGNOSIS — I5043 Acute on chronic combined systolic (congestive) and diastolic (congestive) heart failure: Secondary | ICD-10-CM | POA: Diagnosis present

## 2018-04-25 DIAGNOSIS — E1140 Type 2 diabetes mellitus with diabetic neuropathy, unspecified: Secondary | ICD-10-CM | POA: Diagnosis present

## 2018-04-25 DIAGNOSIS — Z89612 Acquired absence of left leg above knee: Secondary | ICD-10-CM | POA: Diagnosis not present

## 2018-04-25 DIAGNOSIS — I214 Non-ST elevation (NSTEMI) myocardial infarction: Secondary | ICD-10-CM | POA: Diagnosis not present

## 2018-04-25 DIAGNOSIS — R911 Solitary pulmonary nodule: Secondary | ICD-10-CM | POA: Insufficient documentation

## 2018-04-25 DIAGNOSIS — Z4781 Encounter for orthopedic aftercare following surgical amputation: Secondary | ICD-10-CM | POA: Diagnosis not present

## 2018-04-25 DIAGNOSIS — E872 Acidosis: Secondary | ICD-10-CM | POA: Diagnosis not present

## 2018-04-25 DIAGNOSIS — M792 Neuralgia and neuritis, unspecified: Secondary | ICD-10-CM | POA: Diagnosis not present

## 2018-04-25 DIAGNOSIS — K219 Gastro-esophageal reflux disease without esophagitis: Secondary | ICD-10-CM | POA: Diagnosis not present

## 2018-04-25 DIAGNOSIS — D62 Acute posthemorrhagic anemia: Secondary | ICD-10-CM | POA: Diagnosis not present

## 2018-04-25 DIAGNOSIS — I4581 Long QT syndrome: Secondary | ICD-10-CM | POA: Diagnosis not present

## 2018-04-25 LAB — IRON AND TIBC
IRON: 8 ug/dL — AB (ref 28–170)
Saturation Ratios: 4 % — ABNORMAL LOW (ref 10.4–31.8)
TIBC: 182 ug/dL — ABNORMAL LOW (ref 250–450)
UIBC: 174 ug/dL

## 2018-04-25 LAB — BASIC METABOLIC PANEL
ANION GAP: 15 (ref 5–15)
ANION GAP: 12 (ref 5–15)
ANION GAP: 14 (ref 5–15)
BUN: 57 mg/dL — ABNORMAL HIGH (ref 6–20)
BUN: 56 mg/dL — ABNORMAL HIGH (ref 6–20)
BUN: 56 mg/dL — AB (ref 6–20)
CALCIUM: 7.4 mg/dL — AB (ref 8.9–10.3)
CALCIUM: 7.3 mg/dL — AB (ref 8.9–10.3)
CALCIUM: 7.3 mg/dL — AB (ref 8.9–10.3)
CHLORIDE: 102 mmol/L (ref 101–111)
CO2: 17 mmol/L — ABNORMAL LOW (ref 22–32)
CO2: 18 mmol/L — AB (ref 22–32)
CO2: 16 mmol/L — ABNORMAL LOW (ref 22–32)
Chloride: 100 mmol/L — ABNORMAL LOW (ref 101–111)
Chloride: 98 mmol/L — ABNORMAL LOW (ref 101–111)
Creatinine, Ser: 7.54 mg/dL — ABNORMAL HIGH (ref 0.44–1.00)
Creatinine, Ser: 7.41 mg/dL — ABNORMAL HIGH (ref 0.44–1.00)
Creatinine, Ser: 7.65 mg/dL — ABNORMAL HIGH (ref 0.44–1.00)
GFR calc Af Amer: 6 mL/min — ABNORMAL LOW (ref >60)
GFR calc non Af Amer: 6 mL/min — ABNORMAL LOW (ref >60)
GFR, EST AFRICAN AMERICAN: 6 mL/min — AB (ref >60)
GFR, EST AFRICAN AMERICAN: 7 mL/min — AB (ref >60)
GFR, EST NON AFRICAN AMERICAN: 5 mL/min — AB (ref >60)
GFR, EST NON AFRICAN AMERICAN: 5 mL/min — AB (ref >60)
GLUCOSE: 169 mg/dL — AB (ref 65–99)
GLUCOSE: 107 mg/dL — AB (ref 65–99)
Glucose, Bld: 144 mg/dL — ABNORMAL HIGH (ref 65–99)
POTASSIUM: 4 mmol/L (ref 3.5–5.1)
Potassium: 3.9 mmol/L (ref 3.5–5.1)
Potassium: 4 mmol/L (ref 3.5–5.1)
SODIUM: 132 mmol/L — AB (ref 135–145)
SODIUM: 132 mmol/L — AB (ref 135–145)
SODIUM: 128 mmol/L — AB (ref 135–145)

## 2018-04-25 LAB — GLUCOSE, CAPILLARY
GLUCOSE-CAPILLARY: 170 mg/dL — AB (ref 65–99)
GLUCOSE-CAPILLARY: 96 mg/dL (ref 65–99)
GLUCOSE-CAPILLARY: 106 mg/dL — AB (ref 65–99)
Glucose-Capillary: 104 mg/dL — ABNORMAL HIGH (ref 65–99)
Glucose-Capillary: 144 mg/dL — ABNORMAL HIGH (ref 65–99)
Glucose-Capillary: 171 mg/dL — ABNORMAL HIGH (ref 65–99)

## 2018-04-25 LAB — CBC WITH DIFFERENTIAL/PLATELET
Abs Immature Granulocytes: 0.2 10*3/uL — ABNORMAL HIGH (ref 0.0–0.1)
BASOS ABS: 0 10*3/uL (ref 0.0–0.1)
Basophils Relative: 0 %
Eosinophils Absolute: 0 10*3/uL (ref 0.0–0.7)
Eosinophils Relative: 0 %
HEMATOCRIT: 23.4 % — AB (ref 36.0–46.0)
HEMOGLOBIN: 7.3 g/dL — AB (ref 12.0–15.0)
Immature Granulocytes: 1 %
LYMPHS PCT: 5 %
Lymphs Abs: 1 10*3/uL (ref 0.7–4.0)
MCH: 23.6 pg — ABNORMAL LOW (ref 26.0–34.0)
MCHC: 31.2 g/dL (ref 30.0–36.0)
MCV: 75.7 fL — ABNORMAL LOW (ref 78.0–100.0)
MONO ABS: 1.4 10*3/uL — AB (ref 0.1–1.0)
MONOS PCT: 7 %
Neutro Abs: 17.1 10*3/uL — ABNORMAL HIGH (ref 1.7–7.7)
Neutrophils Relative %: 87 %
Platelets: 311 10*3/uL (ref 150–400)
RBC: 3.09 MIL/uL — ABNORMAL LOW (ref 3.87–5.11)
RDW: 17.3 % — ABNORMAL HIGH (ref 11.5–15.5)
WBC: 19.6 10*3/uL — ABNORMAL HIGH (ref 4.0–10.5)

## 2018-04-25 LAB — FERRITIN: FERRITIN: 435 ng/mL — AB (ref 11–307)

## 2018-04-25 LAB — HIV ANTIBODY (ROUTINE TESTING W REFLEX): HIV Screen 4th Generation wRfx: NONREACTIVE

## 2018-04-25 LAB — I-STAT CG4 LACTIC ACID, ED: Lactic Acid, Venous: 1.14 mmol/L (ref 0.5–1.9)

## 2018-04-25 LAB — URINALYSIS, ROUTINE W REFLEX MICROSCOPIC
Bilirubin Urine: NEGATIVE
Glucose, UA: NEGATIVE mg/dL
Ketones, ur: NEGATIVE mg/dL
Leukocytes, UA: NEGATIVE
NITRITE: NEGATIVE
PH: 5 (ref 5.0–8.0)
Protein, ur: 100 mg/dL — AB
SPECIFIC GRAVITY, URINE: 1.012 (ref 1.005–1.030)

## 2018-04-25 LAB — TROPONIN I
TROPONIN I: 1.56 ng/mL — AB (ref <0.03)
TROPONIN I: 5.95 ng/mL — AB (ref <0.03)
Troponin I: 0.13 ng/mL (ref <0.03)
Troponin I: 7.71 ng/mL (ref <0.03)

## 2018-04-25 LAB — COMPREHENSIVE METABOLIC PANEL
ALBUMIN: 1.8 g/dL — AB (ref 3.5–5.0)
ALK PHOS: 131 U/L — AB (ref 38–126)
ALT: 10 U/L — ABNORMAL LOW (ref 14–54)
AST: 26 U/L (ref 15–41)
Anion gap: 14 (ref 5–15)
BILIRUBIN TOTAL: 0.6 mg/dL (ref 0.3–1.2)
BUN: 60 mg/dL — AB (ref 6–20)
CALCIUM: 7.2 mg/dL — AB (ref 8.9–10.3)
CO2: 19 mmol/L — ABNORMAL LOW (ref 22–32)
Chloride: 97 mmol/L — ABNORMAL LOW (ref 101–111)
Creatinine, Ser: 8.12 mg/dL — ABNORMAL HIGH (ref 0.44–1.00)
GFR calc Af Amer: 6 mL/min — ABNORMAL LOW (ref >60)
GFR calc non Af Amer: 5 mL/min — ABNORMAL LOW (ref >60)
GLUCOSE: 147 mg/dL — AB (ref 65–99)
POTASSIUM: 3.9 mmol/L (ref 3.5–5.1)
Sodium: 130 mmol/L — ABNORMAL LOW (ref 135–145)
TOTAL PROTEIN: 6.1 g/dL — AB (ref 6.5–8.1)

## 2018-04-25 LAB — MAGNESIUM: MAGNESIUM: 1.1 mg/dL — AB (ref 1.7–2.4)

## 2018-04-25 LAB — APTT
APTT: 47 s — AB (ref 24–36)
aPTT: 70 seconds — ABNORMAL HIGH (ref 24–36)

## 2018-04-25 LAB — HEPARIN LEVEL (UNFRACTIONATED): HEPARIN UNFRACTIONATED: 1.87 [IU]/mL — AB (ref 0.30–0.70)

## 2018-04-25 LAB — PROCALCITONIN: PROCALCITONIN: 1.59 ng/mL

## 2018-04-25 LAB — MRSA PCR SCREENING: MRSA by PCR: NEGATIVE

## 2018-04-25 LAB — PREGNANCY, URINE: Preg Test, Ur: NEGATIVE

## 2018-04-25 LAB — CBC
HEMATOCRIT: 24.2 % — AB (ref 36.0–46.0)
HEMOGLOBIN: 7.5 g/dL — AB (ref 12.0–15.0)
MCH: 23.7 pg — ABNORMAL LOW (ref 26.0–34.0)
MCHC: 31 g/dL (ref 30.0–36.0)
MCV: 76.3 fL — ABNORMAL LOW (ref 78.0–100.0)
Platelets: 379 10*3/uL (ref 150–400)
RBC: 3.17 MIL/uL — AB (ref 3.87–5.11)
RDW: 17.4 % — ABNORMAL HIGH (ref 11.5–15.5)
WBC: 24.7 10*3/uL — AB (ref 4.0–10.5)

## 2018-04-25 LAB — PHOSPHORUS: PHOSPHORUS: 7 mg/dL — AB (ref 2.5–4.6)

## 2018-04-25 LAB — CK: Total CK: 1049 U/L — ABNORMAL HIGH (ref 38–234)

## 2018-04-25 LAB — CORTISOL: CORTISOL PLASMA: 24 ug/dL

## 2018-04-25 MED ORDER — SODIUM CHLORIDE 0.9 % IV SOLN
250.0000 mL | INTRAVENOUS | Status: DC | PRN
  Administered 2018-04-27 – 2018-04-29 (×3): 250 mL via INTRAVENOUS

## 2018-04-25 MED ORDER — HEPARIN (PORCINE) IN NACL 100-0.45 UNIT/ML-% IJ SOLN
1100.0000 [IU]/h | INTRAMUSCULAR | Status: DC
  Administered 2018-04-25: 900 [IU]/h via INTRAVENOUS
  Administered 2018-04-26: 1100 [IU]/h via INTRAVENOUS
  Filled 2018-04-25 (×2): qty 250

## 2018-04-25 MED ORDER — ONDANSETRON HCL 4 MG/2ML IJ SOLN
4.0000 mg | Freq: Four times a day (QID) | INTRAMUSCULAR | Status: DC | PRN
  Administered 2018-04-25 – 2018-05-11 (×7): 4 mg via INTRAVENOUS
  Filled 2018-04-25 (×8): qty 2

## 2018-04-25 MED ORDER — NOREPINEPHRINE 4 MG/250ML-% IV SOLN
0.0000 ug/min | INTRAVENOUS | Status: DC
  Administered 2018-04-25 (×2): 10 ug/min via INTRAVENOUS
  Administered 2018-04-25: 8 ug/min via INTRAVENOUS
  Filled 2018-04-25 (×5): qty 250

## 2018-04-25 MED ORDER — ACETAMINOPHEN 10 MG/ML IV SOLN
1000.0000 mg | Freq: Once | INTRAVENOUS | Status: DC
  Filled 2018-04-25: qty 100

## 2018-04-25 MED ORDER — SODIUM CHLORIDE 0.9 % IV SOLN: INTRAVENOUS | Status: DC | PRN

## 2018-04-25 MED ORDER — STERILE WATER FOR INJECTION IV SOLN
INTRAVENOUS | Status: DC
  Administered 2018-04-25 – 2018-04-29 (×8): via INTRAVENOUS
  Filled 2018-04-25 (×11): qty 850

## 2018-04-25 MED ORDER — ORAL CARE MOUTH RINSE
15.0000 mL | Freq: Two times a day (BID) | OROMUCOSAL | Status: DC
  Administered 2018-04-25 – 2018-04-28 (×5): 15 mL via OROMUCOSAL

## 2018-04-25 MED ORDER — SODIUM CHLORIDE 0.9 % IV BOLUS
1000.0000 mL | Freq: Once | INTRAVENOUS | Status: AC
  Administered 2018-04-25: 1000 mL via INTRAVENOUS

## 2018-04-25 MED ORDER — INSULIN ASPART 100 UNIT/ML ~~LOC~~ SOLN
0.0000 [IU] | SUBCUTANEOUS | Status: DC
  Administered 2018-04-25 (×2): 3 [IU] via SUBCUTANEOUS
  Administered 2018-04-26 (×4): 2 [IU] via SUBCUTANEOUS
  Administered 2018-04-26: 3 [IU] via SUBCUTANEOUS
  Administered 2018-04-27: 2 [IU] via SUBCUTANEOUS
  Administered 2018-04-27: 3 [IU] via SUBCUTANEOUS
  Administered 2018-04-27 (×2): 2 [IU] via SUBCUTANEOUS
  Administered 2018-04-27: 3 [IU] via SUBCUTANEOUS
  Administered 2018-04-27: 5 [IU] via SUBCUTANEOUS
  Administered 2018-04-28 (×2): 2 [IU] via SUBCUTANEOUS
  Administered 2018-04-28: 3 [IU] via SUBCUTANEOUS
  Administered 2018-05-01 (×2): 2 [IU] via SUBCUTANEOUS
  Administered 2018-05-01: 3 [IU] via SUBCUTANEOUS
  Administered 2018-05-01 (×2): 2 [IU] via SUBCUTANEOUS

## 2018-04-25 MED ORDER — SODIUM CHLORIDE 0.9 % IV SOLN
INTRAVENOUS | Status: DC
  Administered 2018-04-25 – 2018-04-27 (×4): via INTRAVENOUS

## 2018-04-25 MED ORDER — MUPIROCIN 2 % EX OINT: 1.0000 "application " | TOPICAL_OINTMENT | Freq: Two times a day (BID) | CUTANEOUS | Status: DC

## 2018-04-25 MED ORDER — PIPERACILLIN-TAZOBACTAM IN DEX 2-0.25 GM/50ML IV SOLN
2.2500 g | Freq: Three times a day (TID) | INTRAVENOUS | Status: DC
  Administered 2018-04-25 – 2018-04-28 (×10): 2.25 g via INTRAVENOUS
  Filled 2018-04-25 (×11): qty 50

## 2018-04-25 MED ORDER — FAMOTIDINE IN NACL 20-0.9 MG/50ML-% IV SOLN
20.0000 mg | Freq: Every day | INTRAVENOUS | Status: DC
  Administered 2018-04-25 – 2018-05-02 (×7): 20 mg via INTRAVENOUS
  Filled 2018-04-25 (×8): qty 50

## 2018-04-25 NOTE — Progress Notes (Signed)
ANTICOAGULATION CONSULT NOTE - Follow Up Consult  Pharmacy Consult for Heparin (holding Xarelto) Indication: atrial fibrillation  No Known Allergies  Patient Measurements: Height: 5\' 7"  (170.2 cm) Weight: 173 lb 11.6 oz (78.8 kg) IBW/kg (Calculated) : 61.6  Vital Signs: Temp: 98.7 F (37.1 C) (06/08 1200) Temp Source: Oral (06/08 1200) BP: 96/57 (06/08 1200) Pulse Rate: 93 (06/08 1200)  Labs: Recent Labs    04/24/18 2345 04/25/18 0318 04/25/18 0643 04/25/18 0651 04/25/18 1223  HGB 7.3* 7.5*  --   --   --   HCT 23.4* 24.2*  --   --   --   PLT 311 379  --   --   --   APTT  --   --   --   --  70*  HEPARINUNFRC  --   --   --   --  1.87*  CREATININE 8.12* 7.54*  --  7.41*  --   CKTOTAL  --  1,049*  --   --   --   TROPONINI  --  0.13* 1.56*  --  5.95*    Estimated Creatinine Clearance: 9.5 mL/min (A) (by C-G formula based on SCr of 7.41 mg/dL (H)).   Medical History: Past Medical History:  Diagnosis Date  . Arthritis   . Asthma   . Chronic back pain   . Depression   . Diabetes mellitus   . Diabetic neuropathy ([place])   . Family history of adverse reaction to anesthesia    mother had difficlty waking   . Femoral-popliteal bypass graft occlusion, left ([place]) 12/02/2017  . GERD (gastroesophageal reflux disease)   . Hyperlipidemia   . Osteomyelitis of right fibula ([place]) 03/05/2017  . PAD (peripheral artery disease) ([place])   . Paroxysmal atrial fibrillation with rapid ventricular response ([place]) 12/02/2017  . Ulcer    Foot    Assessment: 52 y/o F on Xarelto PTA for afib, now with a gangrenous foot leading to sepsis requiring vasopressor support, holding Xarelto and started on heparin during critical illness. Initial aptt is therapeutic at 70 s. Heparin level is elevated from recent Xarelto dose. H/H low stable. Plt wnl   Goal of Therapy:  Heparin level 0.3-0.7 units/ml aPTT 66-102 seconds Monitor platelets by anticoagulation protocol: Yes   Plan:  -Increase IV  heparin slightly to 950 units/hr  -F/u 8 hr aPTT  -Trend Hgb  - AKA planned for Monday   Albertina Parr, PharmD., BCPS Clinical Pharmacist Clinical phone for 04/25/18 until 3:30pm: [phone] If after 3:30pm, please call main pharmacy at: [phone]

## 2018-04-25 NOTE — Consult Note (Signed)
Patient name: [name] MRN: [id number] DOB: May 29, 1966 Sex: female  REASON FOR CONSULT: gangrene left foot  HPI: [name] is a 52 y.o. female s/p aortobifem and bilateral fem pops by Dr Trula Slade.  Recently left fem PT bypass with subsequent occlusion.  She was offered admission to the hospital by Dr Trula Slade 5 days ago to proceed with left leg amputation but refused.  She now presents to ER with worsening pain and is willing to undergo amputation. She ambulates with a walker.  She is on xarelto for afib.  Past Medical History:  Diagnosis Date  . Arthritis   . Asthma   . Chronic back pain   . Depression   . Diabetes mellitus   . Diabetic neuropathy ([place])   . Family history of adverse reaction to anesthesia    mother had difficlty waking   . Femoral-popliteal bypass graft occlusion, left ([place]) 12/02/2017  . GERD (gastroesophageal reflux disease)   . Hyperlipidemia   . Osteomyelitis of right fibula ([place]) 03/05/2017  . PAD (peripheral artery disease) ([place])   . Paroxysmal atrial fibrillation with rapid ventricular response ([place]) 12/02/2017  . Ulcer    Foot   Past Surgical History:  Procedure Laterality Date  . ABDOMINAL AORTAGRAM  June 15, 2014  . ABDOMINAL AORTAGRAM N/A 06/15/2014   Procedure: ABDOMINAL Maxcine Ham;  Surgeon: Serafina Mitchell, MD;  Location: [facility] CATH LAB;  Service: Cardiovascular;  Laterality: N/A;  . ABDOMINAL AORTAGRAM N/A 11/22/2014   Procedure: ABDOMINAL AORTAGRAM;  Surgeon: Serafina Mitchell, MD;  Location: [facility] CATH LAB;  Service: Cardiovascular;  Laterality: N/A;  . ABDOMINAL AORTOGRAM W/LOWER EXTREMITY N/A 01/07/2017   Procedure: Abdominal Aortogram w/Lower Extremity;  Surgeon: Serafina Mitchell, MD;  Location: [place] CV LAB;  Service: Cardiovascular;  Laterality: N/A;  . ABDOMINAL AORTOGRAM W/LOWER EXTREMITY N/A 10/31/2017   Procedure: ABDOMINAL AORTOGRAM W/LOWER EXTREMITY;  Surgeon: Elam Dutch, MD;  Location: [place] CV LAB;  Service:  Cardiovascular;  Laterality: N/A;  . ABDOMINAL AORTOGRAM W/LOWER EXTREMITY N/A 03/24/2018   Procedure: ABDOMINAL AORTOGRAM W/LOWER EXTREMITY;  Surgeon: Serafina Mitchell, MD;  Location: [place] CV LAB;  Service: Cardiovascular;  Laterality: N/A;  . AORTA - BILATERAL FEMORAL ARTERY BYPASS GRAFT N/A 11/28/2017   Procedure: AORTA BIFEMORAL BYPASS USING HEMASHIELD GOLD GRAFT & REIMPLANT IMA;  Surgeon: Serafina Mitchell, MD;  Location: [facility] OR;  Service: Vascular;  Laterality: N/A;  . AORTIC ARCH ANGIOGRAPHY N/A 10/31/2017   Procedure: AORTIC ARCH ANGIOGRAPHY;  Surgeon: Elam Dutch, MD;  Location: [place] CV LAB;  Service: Cardiovascular;  Laterality: N/A;  . APPLICATION OF WOUND VAC  11/28/2017   Procedure: APPLICATION OF WOUND VAC;  Surgeon: Serafina Mitchell, MD;  Location: [facility] OR;  Service: Vascular;;  . APPLICATION OF WOUND VAC Left 03/27/2018   Procedure: APPLICATION OF WOUND VAC LEFT GROIN USING PREVENA PLUS;  Surgeon: Serafina Mitchell, MD;  Location: [place];  Service: Vascular;  Laterality: Left;  . ARTERIAL BYPASS SURGRY  07/05/2010   Right Common Femoral to below knee popliteal BPG  . BACK SURGERY     X's  2  . CARDIAC CATHETERIZATION    . CHOLECYSTECTOMY     Gall Bladder  . CYSTECTOMY Right    foot  . CYSTECTOMY Left    wrist  . EMBOLECTOMY Left 11/28/2017   Procedure: Left Lower Extremity Embolectomy, Left Tibial Peroneal Trunk Endarterectomy with Patch Angioplasty; Vein Harvest Small Saphenous Graft Left Lower Leg;  Surgeon: [name], MD;  Location: [place];  Service: Vascular;  Laterality: Left;  . FEMORAL-POPLITEAL BYPASS GRAFT Left 03/27/2018   Procedure: THROMBECTOMY OF LEFT FEMORAL TIBIAL BYPASS;  Surgeon: Serafina Mitchell, MD;  Location: [place];  Service: Vascular;  Laterality: Left;  . FEMORAL-TIBIAL BYPASS GRAFT Left 03/27/2018   Procedure: BYPASS GRAFT FEMORAL-TIBIAL ARTERY LEFT REDO USING CRYOPRESERVED SAPHENOUS VEIN 70cm;  Surgeon: Serafina Mitchell, MD;   Location: [facility] OR;  Service: Vascular;  Laterality: Left;  . INTERCOSTAL NERVE BLOCK  November 2015  . INTRAOPERATIVE ARTERIOGRAM  11/28/2017   Procedure: INTRA OPERATIVE ARTERIOGRAM OF LEFT LEG;  Surgeon: Serafina Mitchell, MD;  Location: [place];  Service: Vascular;;  . INTRAOPERATIVE ARTERIOGRAM Left 03/27/2018   Procedure: INTRA OPERATIVE ARTERIOGRAM TIMES TWO;  Surgeon: Serafina Mitchell, MD;  Location: [place];  Service: Vascular;  Laterality: Left;  . IR FLUORO GUIDE CV LINE RIGHT  03/20/2017  . IR US GUIDE VASC ACCESS RIGHT  03/20/2017  . IRRIGATION AND DEBRIDEMENT BUTTOCKS Right 09/30/2016   Procedure: DEBRIDEMENT RIGHT  BUTTOCK WOUND;  Surgeon: Georganna Skeans, MD;  Location: [place];  Service: General;  Laterality: Right;  . left foot surgery    . left wrist cyst removal Left   . PERIPHERAL VASCULAR CATHETERIZATION N/A 05/07/2016   Procedure: Abdominal Aortogram;  Surgeon: Serafina Mitchell, MD;  Location: [place] CV LAB;  Service: Cardiovascular;  Laterality: N/A;  . PERIPHERAL VASCULAR CATHETERIZATION N/A 05/07/2016   Procedure: Lower Extremity Angiography;  Surgeon: Serafina Mitchell, MD;  Location: [place] CV LAB;  Service: Cardiovascular;  Laterality: N/A;  . PERIPHERAL VASCULAR CATHETERIZATION N/A 05/07/2016   Procedure: Aortic Arch Angiography;  Surgeon: Serafina Mitchell, MD;  Location: [place] CV LAB;  Service: Cardiovascular;  Laterality: N/A;  . PERIPHERAL VASCULAR CATHETERIZATION N/A 05/07/2016   Procedure: Upper Extremity Angiography;  Surgeon: Serafina Mitchell, MD;  Location: [place] CV LAB;  Service: Cardiovascular;  Laterality: N/A;  . PERIPHERAL VASCULAR CATHETERIZATION Right 05/07/2016   Procedure: Peripheral Vascular Balloon Angioplasty;  Surgeon: Serafina Mitchell, MD;  Location: [place] CV LAB;  Service: Cardiovascular;  Laterality: Right;  subclavian  . PERIPHERAL VASCULAR CATHETERIZATION Right 05/07/2016   Procedure: Peripheral Vascular Intervention;  Surgeon: Serafina Mitchell, MD;  Location: [place] CV LAB;  Service: Cardiovascular;  Laterality: Right;  External  Iliac  . SKIN GRAFT Right 2012   [name] by Dr. Nils Pyle- Right and Left Ankle  . SPINE SURGERY    . THROMBECTOMY FEMORAL ARTERY  11/28/2017   Procedure: THROMBECTOMY  & REVISION OF BILATERAL FEMORAL TO POPLETEAL ARTERIES;  Surgeon: Serafina Mitchell, MD;  Location: [facility] OR;  Service: Vascular;;  . TONSILLECTOMY      Family History  Problem Relation Age of Onset  . Coronary artery disease Mother   . Peripheral vascular disease Mother   . Heart disease Mother        Before age 70  . Other Mother        Venous insuffiency  . Diabetes Mother   . Hyperlipidemia Mother   . Hypertension Mother   . Varicose Veins Mother   . Heart attack Mother        before age 12  . Heart disease Father   . Diabetes Father   . Diabetes Maternal Grandmother   . Diabetes Paternal Grandmother   . Diabetes Paternal Grandfather   . Diabetes Sister   . Hypertension Sister   .  Diabetes Brother   . Hypertension Brother     SOCIAL HISTORY: Social History   Socioeconomic History  . Marital status: Married    Spouse name: Not on file  . Number of children: Not on file  . Years of education: Not on file  . Highest education level: Not on file  Occupational History  . Occupation: disabled  Social Needs  . Financial resource strain: Not on file  . Food insecurity:    Worry: Not on file    Inability: Not on file  . Transportation needs:    Medical: Not on file    Non-medical: Not on file  Tobacco Use  . Smoking status: Former Smoker    Years: 30.00    Types: Cigarettes    Last attempt to quit: 02/07/2017    Years since quitting: 1.2  . Smokeless tobacco: Never Used  Substance and Sexual Activity  . Alcohol use: No    Alcohol/week: 0.0 oz  . Drug use: No  . Sexual activity: Yes    Birth control/protection: Post-menopausal  Lifestyle  . Physical activity:    Days per week: Not on file    Minutes  per session: Not on file  . Stress: Not on file  Relationships  . Social connections:    Talks on phone: Not on file    Gets together: Not on file    Attends religious service: Not on file    Active member of club or organization: Not on file    Attends meetings of clubs or organizations: Not on file    Relationship status: Not on file  . Intimate partner violence:    Fear of current or ex partner: Not on file    Emotionally abused: Not on file    Physically abused: Not on file    Forced sexual activity: Not on file  Other Topics Concern  . Not on file  Social History Narrative  . Not on file    No Known Allergies  Current Facility-Administered Medications  Medication Dose Route Frequency Provider Last Rate Last Dose  . acetaminophen (TYLENOL) tablet 650 mg  650 mg Oral Once Recardo Evangelist, PA-C      . piperacillin-tazobactam (ZOSYN) IVPB 3.375 g  3.375 g Intravenous Q8H Bajbus, Lauren D, RPH      . sodium chloride 0.9 % bolus 1,000 mL  1,000 mL Intravenous Once Norm Salt, MD      . vancomycin (VANCOCIN) IVPB 1000 mg/200 mL premix  1,000 mg Intravenous Q12H Bajbus, Lauren D, RPH       Current Outpatient Medications  Medication Sig Dispense Refill  . albuterol (PROVENTIL) 2 MG tablet Take 2 mg by mouth 3 (three) times daily as needed for shortness of breath.     Marland Kitchen aspirin EC 81 MG tablet Take 81 mg by mouth every evening.    . cetirizine (ZYRTEC) 10 MG tablet Take 10 mg by mouth daily.     . clindamycin (CLEOCIN) 300 MG capsule Take 1 capsule (300 mg total) by mouth 3 (three) times daily. 21 capsule 1  . doxycycline (VIBRA-TABS) 100 MG tablet Take 1 tablet (100 mg total) by mouth 2 (two) times daily. (Patient taking differently: Take 100 mg by mouth daily. ) 60 tablet 4  . HUMALOG KWIKPEN 100 UNIT/ML KiwkPen Inject 10 Units into the skin See admin instructions. 10 units three times a day before meals per sliding scale    . HYDROcodone-acetaminophen (NORCO) 10-325 MG tablet  Take 1  tablet by mouth 2 (two) times daily as needed for severe pain.     Marland Kitchen LANTUS SOLOSTAR 100 UNIT/ML Solostar Pen Inject 36 Units into the skin 2 (two) times daily.     Marland Kitchen loperamide (IMODIUM A-D) 2 MG tablet Take 12 mg by mouth daily as needed for diarrhea or loose stools.    . metoprolol tartrate (LOPRESSOR) 25 MG tablet Take 0.5 tablets (12.5 mg total) by mouth 2 (two) times daily. 90 tablet 3  . omeprazole (PRILOSEC) 20 MG capsule Take 20 mg by mouth daily.    . ondansetron (ZOFRAN-ODT) 4 MG disintegrating tablet DISSOLVE 1 TABLET IN MOUTH EVERY 8 HOURS AS NEEDED FOR NAUSEA OR VOMITING 20 tablet 0  . pregabalin (LYRICA) 100 MG capsule Limit 1 tab by mouth twice a day to 3 times a day if tolerated (Patient taking differently: Take 100 mg by mouth 2 (two) times daily. ) 90 capsule 0  . rivaroxaban (XARELTO) 20 MG TABS tablet Take 1 tablet (20 mg total) by mouth daily with supper. 30 tablet 2  . rosuvastatin (CRESTOR) 20 MG tablet Take 1 tablet (20 mg total) by mouth daily at 6 PM. 30 tablet 2    ROS:   General:  No weight loss, +Fever, chills  HEENT: No recent headaches, no nasal bleeding, no visual changes, no sore throat  Neurologic: No dizziness, blackouts, seizures. No recent symptoms of stroke or mini- stroke. No recent episodes of slurred speech, or temporary blindness.  Cardiac: No recent episodes of chest pain/pressure, no shortness of breath at rest.  + shortness of breath with exertion.  Denies history of atrial fibrillation or irregular heartbeat  Vascular: + history of rest pain in feet.  + history of claudication.  +history of non-healing ulcer, No history of DVT   Pulmonary: No home oxygen, no productive cough, no hemoptysis,  No asthma or wheezing  Musculoskeletal:  [X]  Arthritis, [X]  Low back pain,  [X]  Joint pain  Hematologic:No history of hypercoagulable state.  No history of easy bleeding.  No history of anemia  Gastrointestinal: No hematochezia or melena,  No  gastroesophageal reflux, no trouble swallowing  Urinary: [ ]  chronic Kidney disease, [ ]  on HD - [ ]  MWF or [ ]  TTHS, [ ]  Burning with urination, [ ]  Frequent urination, [ ]  Difficulty urinating;   Skin: No rashes  Psychological: No history of anxiety,  No history of depression   Physical Examination  Vitals:   04/24/18 2035 04/24/18 2045 04/24/18 2128  BP: (!) 79/60  91/66  Pulse: (!) 109  (!) 107  Resp: 20  16  Temp: 100.1 F (37.8 C)  (!) 101.6 F (38.7 C)  TempSrc: Oral  Oral  SpO2: 90%  94%  Weight:  160 lb (72.6 kg)   Height:  5\' 7"  (1.702 m)     Body mass index is 25.06 kg/m.  General:  Alert and oriented, lethargic, no acute distress HEENT: Normal, poor dentition Neck: No JVD Pulmonary: Clear to auscultation bilaterally Cardiac: Regular Rate and Rhythm  Abdomen: Soft, non-tender, non-distended, obese healed midline scar Skin: No rash, erythema left leg ankle to proximal calf, left foot purple Extremity Pulses:  2+ radial, brachial, femoral, absent dorsalis pedis, posterior tibial pulses bilaterally Musculoskeletal: No deformity or edema  Neurologic: Upper and lower extremity motor 5/5 and symmetric  DATA:  CBC    Component Value Date/Time   WBC 19.6 (H) 04/24/2018 2345   RBC 3.09 (L) 04/24/2018 2345   HGB 7.3 (  L) 04/24/2018 2345   HCT 23.4 (L) 04/24/2018 2345   PLT 311 04/24/2018 2345   MCV 75.7 (L) 04/24/2018 2345   MCH 23.6 (L) 04/24/2018 2345   MCHC 31.2 04/24/2018 2345   RDW 17.3 (H) 04/24/2018 2345   LYMPHSABS 1.0 04/24/2018 2345   MONOABS 1.4 (H) 04/24/2018 2345   EOSABS 0.0 04/24/2018 2345   BASOSABS 0.0 04/24/2018 2345    BMET    Component Value Date/Time   NA 136 03/31/2018 0640   K 4.1 03/31/2018 0640   CL 101 03/31/2018 0640   CO2 24 03/31/2018 0640   GLUCOSE 55 (L) 03/31/2018 0640   BUN 15 03/31/2018 0640   CREATININE 1.07 (H) 03/31/2018 0640   CREATININE 1.24 (H) 08/12/2017 1530   CALCIUM 7.9 (L) 03/31/2018 0640   GFRNONAA 58  (L) 03/31/2018 0640   GFRNONAA 50 (L) 08/12/2017 1530   GFRAA >60 03/31/2018 0640   GFRAA 57 (L) 08/12/2017 1530     ASSESSMENT:  Pt with gangrene left foot.  Non salvageable.  Currently septic with BP in 70s in ER despite fluid bolus   PLAN:  Suggest admit to critical care service for resuscitation Left above knee amputation on Monday 04/27/18 if more stable Stop xarelto now and transition to heparin for afib Will follow as consult.   Ruta Hinds, MD [facility] [place] [facility] Office: [phone] Pager: [phone]

## 2018-04-25 NOTE — Procedures (Signed)
Central Venous Catheter Insertion Procedure Note [name] [id number] 01/19/1966  Procedure: Insertion of Central Venous Catheter Indications: Assessment of intravascular volume, Drug and/or fluid administration and Frequent blood sampling  Procedure Details Consent: Risks of procedure as well as the alternatives and risks of each were explained to the (patient/caregiver).  Consent for procedure obtained. Time Out: Verified patient identification, verified procedure, site/side was marked, verified correct patient position, special equipment/implants available, medications/allergies/relevent history reviewed, required imaging and test results available.  Performed  Maximum sterile technique was used including antiseptics, cap, gloves, gown, hand hygiene, mask and sheet. Skin prep: Chlorhexidine; local anesthetic administered A antimicrobial bonded/coated triple lumen catheter was placed in the left internal jugular vein using the Seldinger technique.  Evaluation Blood flow good Complications: No apparent complications Patient did tolerate procedure well. Chest X-ray ordered to verify placement.  CXR: pending.  Hayden Pedro, AGACNP-BC [name]  Pgr: [phone]  PCCM Pgr: [phone]

## 2018-04-25 NOTE — Plan of Care (Signed)
  Problem: Respiratory: Goal: Ability to maintain adequate ventilation will improve Outcome: Progressing Note:  Stable on 2L [place]   Problem: Fluid Volume: Goal: Hemodynamic stability will improve Outcome: Not Progressing Note:  On Levophed

## 2018-04-25 NOTE — Progress Notes (Signed)
[facility] for Heparin (holding Xarelto) Indication: atrial fibrillation  No Known Allergies  Patient Measurements: Height: 5\' 7"  (170.2 cm) Weight: 173 lb 11.6 oz (78.8 kg) IBW/kg (Calculated) : 61.6  Vital Signs: Temp: 100.1 F (37.8 C) (06/08 2348) Temp Source: Oral (06/08 2348) BP: 98/61 (06/08 2315) Pulse Rate: 101 (06/08 2315)  Labs: Recent Labs    04/24/18 2345  04/25/18 0318 04/25/18 8676 04/25/18 0651 04/25/18 1223 04/25/18 1741 04/25/18 2215  HGB 7.3*  --  7.5*  --   --   --   --   --   HCT 23.4*  --  24.2*  --   --   --   --   --   PLT 311  --  379  --   --   --   --   --   APTT  --   --   --   --   --  70*  --  47*  HEPARINUNFRC  --   --   --   --   --  1.87*  --   --   CREATININE 8.12*  --  7.54*  --  7.41*  --  7.65*  --   CKTOTAL  --   --  1,049*  --   --   --   --   --   TROPONINI  --    < > 0.13* 1.56*  --  5.95*  --  7.71*   < > = values in this interval not displayed.    Estimated Creatinine Clearance: 9.2 mL/min (A) (by C-G formula based on SCr of 7.65 mg/dL (H)).   Medical History: Past Medical History:  Diagnosis Date  . Arthritis   . Asthma   . Chronic back pain   . Depression   . Diabetes mellitus   . Diabetic neuropathy ([place])   . Family history of adverse reaction to anesthesia    mother had difficlty waking   . Femoral-popliteal bypass graft occlusion, left ([place]) 12/02/2017  . GERD (gastroesophageal reflux disease)   . Hyperlipidemia   . Osteomyelitis of right fibula ([place]) 03/05/2017  . PAD (peripheral artery disease) ([place])   . Paroxysmal atrial fibrillation with rapid ventricular response ([place]) 12/02/2017  . Ulcer    Foot    Assessment: 52 y/o F on Xarelto PTA for afib, now with a gangrenous foot leading to sepsis requiring vasopressor support, holding Xarelto and starting heparin during critical illness, last dose Xarelto >24 hours ago, starting heparin now, will be using aPTT to dose  heparin for the next several days due to Xarelto influence on anti-Xa levels. Acute renal failure. Hgb 7.3 (watch).   6/9 PM update: aPTT is low tonight, no infusion issues per RN  Goal of Therapy:  Heparin level 0.3-0.7 units/ml aPTT 66-102 seconds Monitor platelets by anticoagulation protocol: Yes   Plan:  -Inc heparin to 1100 units/hr -0800 aPTT -Trend Hgb   [name] 04/25/2018,11:59 PM

## 2018-04-25 NOTE — Progress Notes (Addendum)
  Progress Note    04/25/2018 11:25 AM * No surgery found *  Subjective:  Somnolent on exam   Vitals:   04/25/18 0645 04/25/18 0800  BP: 112/65 99/63  Pulse: (!) 107 (!) 101  Resp: 12 12  Temp:    SpO2: 100% 99%   Physical Exam: Lungs:  Non labored on [place] Incisions:  abd incision well healed; LLE incisions well healed Extremities:  Ischemic L foot with gangrenous toes and foul odor; dressing left in place L foot Abdomen:  soft Neurologic: somnolent on exam  CBC    Component Value Date/Time   WBC 24.7 (H) 04/25/2018 0318   RBC 3.17 (L) 04/25/2018 0318   HGB 7.5 (L) 04/25/2018 0318   HCT 24.2 (L) 04/25/2018 0318   PLT 379 04/25/2018 0318   MCV 76.3 (L) 04/25/2018 0318   MCH 23.7 (L) 04/25/2018 0318   MCHC 31.0 04/25/2018 0318   RDW 17.4 (H) 04/25/2018 0318   LYMPHSABS 1.0 04/24/2018 2345   MONOABS 1.4 (H) 04/24/2018 2345   EOSABS 0.0 04/24/2018 2345   BASOSABS 0.0 04/24/2018 2345    BMET    Component Value Date/Time   NA 132 (L) 04/25/2018 0651   K 3.9 04/25/2018 0651   CL 102 04/25/2018 0651   CO2 18 (L) 04/25/2018 0651   GLUCOSE 144 (H) 04/25/2018 0651   BUN 56 (H) 04/25/2018 0651   CREATININE 7.41 (H) 04/25/2018 0651   CREATININE 1.24 (H) 08/12/2017 1530   CALCIUM 7.3 (L) 04/25/2018 0651   GFRNONAA 6 (L) 04/25/2018 0651   GFRNONAA 50 (L) 08/12/2017 1530   GFRAA 7 (L) 04/25/2018 0651   GFRAA 57 (L) 08/12/2017 1530    INR    Component Value Date/Time   INR 1.15 03/24/2018 1440     Intake/Output Summary (Last 24 hours) at 04/25/2018 1125 Last data filed at 04/25/2018 0700 Gross per 24 hour  Intake 5231.48 ml  Output 80 ml  Net 5151.48 ml     Assessment/Plan:  52 y.o. female with gangrenous L foot  Holding Xarelto; transition to IV heparin per pharmacy LLE ischemic with tissue changes; not salvageable Plan will be for AKA early next week if medically stable Patient is aware of need for amputation and is willing to proceed   Dagoberto Ligas,  PA-C [facility] [phone] 04/25/2018 11:25 AM  On levophed  Heparin for afib Antibiotics sepsis related to left foot. Plan for left AKA on Monday if stable  Ruta Hinds, MD [facility] Office: [phone] Pager: [phone]

## 2018-04-25 NOTE — ED Notes (Signed)
Pt refused tylenol PO, provider asked to order med in different route.

## 2018-04-25 NOTE — Progress Notes (Signed)
CRITICAL VALUE ALERT  Critical Value:  5.95  Date & Time Notied:  04/25/2018 & 1450  Provider Notified: [place]  Orders Received/Actions taken: None at this time

## 2018-04-25 NOTE — Progress Notes (Signed)
ANTICOAGULATION CONSULT NOTE - Initial Consult  Pharmacy Consult for Heparin (holding Xarelto) Indication: atrial fibrillation  No Known Allergies  Patient Measurements: Height: 5\' 7"  (170.2 cm) Weight: 160 lb (72.6 kg) IBW/kg (Calculated) : 61.6  Vital Signs: Temp: 101.6 F (38.7 C) (06/07 2128) Temp Source: Oral (06/07 2128) BP: 116/62 (06/08 0115) Pulse Rate: 98 (06/08 0115)  Labs: Recent Labs    04/24/18 2345  HGB 7.3*  HCT 23.4*  PLT 311  CREATININE 8.12*    Estimated Creatinine Clearance: 7.8 mL/min (A) (by C-G formula based on SCr of 8.12 mg/dL (H)).   Medical History: Past Medical History:  Diagnosis Date  . Arthritis   . Asthma   . Chronic back pain   . Depression   . Diabetes mellitus   . Diabetic neuropathy ([place])   . Family history of adverse reaction to anesthesia    mother had difficlty waking   . Femoral-popliteal bypass graft occlusion, left ([place]) 12/02/2017  . GERD (gastroesophageal reflux disease)   . Hyperlipidemia   . Osteomyelitis of right fibula ([place]) 03/05/2017  . PAD (peripheral artery disease) ([place])   . Paroxysmal atrial fibrillation with rapid ventricular response ([place]) 12/02/2017  . Ulcer    Foot    Assessment: 52 y/o F on Xarelto PTA for afib, now with a gangrenous foot leading to sepsis requiring vasopressor support, holding Xarelto and starting heparin during critical illness, last dose Xarelto >24 hours ago, starting heparin now, will be using aPTT to dose heparin for the next several days due to Xarelto influence on anti-Xa levels. Acute renal failure. Hgb 7.3 (watch).   Goal of Therapy:  Heparin level 0.3-0.7 units/ml aPTT 66-102 seconds Monitor platelets by anticoagulation protocol: Yes   Plan:  -Start heparin drip at 900 units/hr -1100 aPTT/HL -Daily CBC/HL/aPTT -Trend Hgb   [name] 04/25/2018,2:03 AM

## 2018-04-25 NOTE — ED Notes (Signed)
Harrold Donath husband cell: [phone] home: [phone]

## 2018-04-25 NOTE — Progress Notes (Signed)
Pharmacy Antibiotic Note  [name] is a 52 y.o. female admitted on 04/24/2018 with left foot gangrene.  Pharmacy has been consulted for Vancomycin/Zosyn dosing.  Previous doses were put in using Scr from May which was normal. Labs are back and pt is now in acute renal failure with CrCl <10. Will adjust doses.   Plan: -Hold vancomycin, check a random vancomycin level 6/9 at 0500 -Change Zosyn to 2.25g IV q8h -Follow renal function and adjust doses as needed  Height: 5\' 7"  (170.2 cm) Weight: 160 lb (72.6 kg) IBW/kg (Calculated) : 61.6  Temp (24hrs), Avg:100.9 F (38.3 C), Min:100.1 F (37.8 C), Max:101.6 F (38.7 C)  Recent Labs  Lab 04/24/18 2345 04/25/18 0000  WBC 19.6*  --   CREATININE 8.12*  --   LATICACIDVEN  --  1.14    Estimated Creatinine Clearance: 7.8 mL/min (A) (by C-G formula based on SCr of 8.12 mg/dL (H)).    No Known Allergies   [name] 04/25/2018 2:16 AM

## 2018-04-25 NOTE — H&P (Addendum)
PULMONARY / CRITICAL CARE MEDICINE   Name: [name] MRN: [id number] DOB: May 10, 1966    ADMISSION DATE:  04/24/2018 CONSULTATION DATE:  04/25/2018  REFERRING MD:  Dr. 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 Post-Op patient lost pulse to left foot and was taken back to OR for thrombectomy. On 6/3 patient went to outpatient vascular appointment where she was found to by hypotensive and febrile with a occlusion noted to bypass graft. Patient was told at this time to got to ED however, she was going on a camping trip in which she did not want to miss. Sent home with clindamycin.   On 6/7 patient presented to ED with AMS with pain to left lower leg and emesis and diarrhea. On arrival BP 71/48. LA 1.14. Given 3.5L NS without improvement, started on levophed gtt. Administered Vancomycin and Zosyn. LLE with no pulse and gangrenous. Vascular Consulted. Plans for BKA on Monday. PCCM asked to admit.  Patient states she took Xarelto last on 6/7.   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  PAST SURGICAL HISTORY: She  has a past surgical history that includes Arterial bypass surgry (07/05/2010); Skin graft (Right, 2012); Back surgery; Tonsillectomy; Spine surgery; Cholecystectomy; Abdominal aortagram (June 15, 2014); Intercostal nerve block (November 2015); abdominal aortagram (N/A, 06/15/2014); abdominal aortagram (N/A, 11/22/2014); Cystectomy (Right); Cystectomy (Left); left foot surgery; left wrist cyst removal (Left); Cardiac catheterization (N/A, 05/07/2016); Cardiac catheterization (N/A, 05/07/2016); Cardiac catheterization (N/A, 05/07/2016); Cardiac catheterization (N/A, 05/07/2016); Cardiac catheterization (Right, 05/07/2016); Cardiac catheterization (Right, 05/07/2016); Cardiac catheterization; Irrigation and debridement buttocks (Right, 09/30/2016); ABDOMINAL AORTOGRAM W/LOWER EXTREMITY (N/A, 01/07/2017); IR Fluoro Guide CV Line Right (03/20/2017); IR US Guide Vasc Access Right (03/20/2017); ABDOMINAL AORTOGRAM W/LOWER EXTREMITY (N/A, 10/31/2017); AORTIC ARCH ANGIOGRAPHY (N/A, 10/31/2017); Embolectomy (Left, 11/28/2017); Aorta - bilateral femoral artery bypass graft (N/A, 11/28/2017); Application if wound vac (11/28/2017); Intraoperative arteriogram (11/28/2017); Thrombectomy femoral artery (11/28/2017); ABDOMINAL AORTOGRAM W/LOWER EXTREMITY (N/A, 03/24/2018); Femoral-tibial Bypass Graft (Left, 03/27/2018); Application if wound vac (Left, 03/27/2018); Femoral-popliteal Bypass Graft (Left, 03/27/2018); and Intraoperative arteriogram (Left, 03/27/2018).  No Known Allergies  No current facility-administered medications on file prior to encounter.    Current Outpatient Medications on File Prior to Encounter  Medication Sig  . albuterol (PROVENTIL) 2 MG tablet Take 2 mg by mouth 3 (three) times daily as needed for shortness of breath.   Marland Kitchen aspirin EC 81 MG tablet Take 81 mg by mouth every evening.  . cetirizine (ZYRTEC) 10 MG tablet Take 10 mg by mouth daily.   . clindamycin (CLEOCIN) 300 MG capsule Take 1 capsule (300 mg total) by mouth 3 (three) times daily.  Marland Kitchen doxycycline (VIBRA-TABS) 100 MG tablet Take 1 tablet (100 mg total) by mouth 2 (two) times daily. (Patient taking differently: Take 100 mg by mouth daily. )  . HUMALOG  KWIKPEN 100 UNIT/ML KiwkPen Inject 10 Units into the skin See  admin instructions. 10 units three times a day before meals per sliding scale  . HYDROcodone-acetaminophen (NORCO) 10-325 MG tablet Take 1 tablet by mouth 2 (two) times daily as needed for severe pain.   Marland Kitchen LANTUS SOLOSTAR 100 UNIT/ML Solostar Pen Inject 36 Units into the skin 2 (two) times daily.   Marland Kitchen loperamide (IMODIUM A-D) 2 MG tablet Take 12 mg by mouth daily as needed for diarrhea or loose stools.  . metoprolol tartrate (LOPRESSOR) 25 MG tablet Take 0.5 tablets (12.5 mg total) by mouth 2 (two) times daily.  Marland Kitchen omeprazole (PRILOSEC) 20 MG capsule Take 20 mg by mouth daily.  . ondansetron (ZOFRAN-ODT) 4 MG disintegrating tablet DISSOLVE 1 TABLET IN MOUTH EVERY 8 HOURS AS NEEDED FOR NAUSEA OR VOMITING  . pregabalin (LYRICA) 100 MG capsule Limit 1 tab by mouth twice a day to 3 times a day if tolerated (Patient taking differently: Take 100 mg by mouth 2 (two) times daily. )  . rivaroxaban (XARELTO) 20 MG TABS tablet Take 1 tablet (20 mg total) by mouth daily with supper.  . rosuvastatin (CRESTOR) 20 MG tablet Take 1 tablet (20 mg total) by mouth daily at 6 PM.    FAMILY HISTORY:  Her indicated that her mother is deceased. She indicated that her father is deceased. She indicated that the status of her sister is unknown. She indicated that the status of her brother is unknown. She indicated that her maternal grandmother is deceased. She indicated that her paternal grandmother is deceased. She indicated that her paternal grandfather is deceased.   SOCIAL HISTORY: She  reports that she quit smoking about 14 months ago. Her smoking use included cigarettes. She quit after 30.00 years of use. She has never used smokeless tobacco. She reports that she does not drink alcohol or use drugs.  REVIEW OF SYSTEMS:   All negative; except for those that are bolded, which indicate positives.  Constitutional: weight loss, weight gain, night sweats,  fevers, chills, fatigue, weakness.  HEENT: headaches, sore throat, sneezing, nasal congestion, post nasal drip, difficulty swallowing, tooth/dental problems, visual complaints, visual changes, ear aches. Neuro: difficulty with speech, weakness, numbness, ataxia. CV:  chest pain, orthopnea, PND, swelling in lower extremities, dizziness, palpitations, syncope.  Resp: cough, hemoptysis, dyspnea, wheezing. GI: heartburn, indigestion, abdominal pain, nausea, vomiting, diarrhea, constipation, change in bowel habits, loss of appetite, hematemesis, melena, hematochezia.  GU: dysuria, change in color of urine, urgency or frequency, flank pain, hematuria. MSK: joint pain or swelling, decreased range of motion. Psych: change in mood or affect, depression, anxiety, suicidal ideations, homicidal ideations. Skin: rash, itching, bruising.   SUBJECTIVE:   VITAL SIGNS: BP 116/62   Pulse 98   Temp (!) 101.6 F (38.7 C) (Oral)   Resp 15   Ht 5\' 7"  (1.702 m)   Wt 72.6 kg (160 lb)   SpO2 (!) 88%   BMI 25.06 kg/m   HEMODYNAMICS:    VENTILATOR SETTINGS:    INTAKE / OUTPUT: No intake/output data recorded.  PHYSICAL EXAMINATION: General:  Adult female, no distress  Neuro:  Lethargic, follows commands, opens eyes to physical/verbal stimulation  HEENT:  Dry MM Cardiovascular:  Irregular, no MRG  Lungs:  Clear breath sounds  Abdomen:  Tender, non-distended  Musculoskeletal:  LLE +2 red, inflamed   Skin:  Dry   LABS:  BMET Recent Labs  Lab 04/24/18 2345  NA 130*  K 3.9  CL 97*  CO2 19*  BUN 60*  CREATININE 8.12*  GLUCOSE 147*  Electrolytes Recent Labs  Lab 04/24/18 2345  CALCIUM 7.2*    CBC Recent Labs  Lab 04/24/18 2345  WBC 19.6*  HGB 7.3*  HCT 23.4*  PLT 311    Coag's No results for input(s): APTT, INR in the last 168 hours.  Sepsis Markers Recent Labs  Lab 04/25/18 0000  LATICACIDVEN 1.14    ABG No results for input(s): PHART, PCO2ART, PO2ART in the  last 168 hours.  Liver Enzymes Recent Labs  Lab 04/24/18 2345  AST 26  ALT 10*  ALKPHOS 131*  BILITOT 0.6  ALBUMIN 1.8*    Cardiac Enzymes No results for input(s): TROPONINI, PROBNP in the last 168 hours.  Glucose No results for input(s): GLUCAP in the last 168 hours.  Imaging Dg Chest Portable 1 View  Result Date: 04/24/2018 CLINICAL DATA:  Initial evaluation for acute sepsis. EXAM: PORTABLE CHEST 1 VIEW COMPARISON:  Prior radiograph from 12/02/2017. FINDINGS: Moderate cardiomegaly, stable. Mediastinal silhouette within normal limits. No focal infiltrates. No pulmonary edema or visible pleural effusion. No pneumothorax. Osseous structures within normal limits. IMPRESSION: 1. No active cardiopulmonary disease. 2. Stable cardiomegaly without pulmonary edema. Electronically Signed   By: Jeannine Boga M.D.   On: 04/24/2018 22:19     STUDIES:  CXR 6/7 > Moderate cardiomegaly, stable. Mediastinal silhouette within normal Limits. No focal infiltrates. No pulmonary edema or visible pleural effusion. No pneumothorax. Osseous structures within normal limits  CULTURES: Blood 6/7 >> U/A 6/7 >>   ANTIBIOTICS: Vancomycin 6/7 >> Zosyn 6/7 >>  SIGNIFICANT EVENTS: 6/7 > Presents to ED   LINES/TUBES: PIV   DISCUSSION: 52 year old female presents in Septic Shock secondary to gangrenous left foot   ASSESSMENT / PLAN:  Septic Shock in setting of gangrenous left foot  H/O A.Fib on Xarelto, PAD  P:  Vascular Following > plans for Amputation on 6/10 Cardiac Monitoring  Wean Levophed to maintain MAP >65 Trend CVP  Trend Troponin  Heparin gtt (Hold Xarelto)   Acute Kidney Injury in setting of septic shock vs Rhabdo   Crt 8.12 >>  LA 1.14  P:   Trend BMP  Replace electrolytes as indicated  CK pending  NS @ 125 ml/hr, s/p 4.5 L   GERD Nausea/Vomiting  P:   NPO Zofran PRN  Pepcid BID   Anemia  P:  Trend CBC  Iron Studies pending   Gangrenous Left Leg  -WBC  19.6, Febrile (Tmax 101)  P:   Trend WBC and Fever Curve  Trend PCT Follow Culture Data Continue Vancomycin/Zosyn    DM   P:   Trend Glucose  SSI  Cortisol pending    FAMILY  - Updates: Husband updated at bedside  - Inter-disciplinary family meet or Palliative Care meeting due by: 05/02/2018    Hayden Pedro, AGACNP-BC [facility]  Pgr: [phone]  PCCM Pgr: [phone]

## 2018-04-25 NOTE — Progress Notes (Signed)
PULMONARY / CRITICAL CARE MEDICINE   Name: [name] MRN: [id number] DOB: [birth year]    ADMISSION DATE:  04/24/2018 CONSULTATION DATE:  04/25/2018  REFERRING MD:  Dr. 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 Post-Op patient lost pulse to left foot and was taken back to OR for thrombectomy. On 6/3 patient went to outpatient vascular appointment where she was found to by hypotensive and febrile with a occlusion noted to bypass graft. Patient was told at this time to got to ED however, she was going on a camping trip in which she did not want to miss. Sent home with clindamycin.   On 6/7 patient presented to ED with AMS with pain to left lower leg and emesis and diarrhea. On arrival BP 71/48. LA 1.14. Given 3.5L NS without improvement, started on levophed gtt. Administered Vancomycin and Zosyn. LLE with no pulse and gangrenous. Vascular Consulted. Plans for BKA on Monday. PCCM asked to admit.  Patient states she took Xarelto last on 6/7.     SUBJECTIVE:  Currently comfortable Remains on heparin drip, low-dose norepinephrine drip Evaluated by vascular surgery last night   VITAL SIGNS: BP 99/63 (BP Location: Right Arm)   Pulse (!) 101   Temp 99.9 F (37.7 C) (Oral)   Resp 12   Ht 5\' 7"  (1.702 m)   Wt 78.8 kg (173 lb 11.6 oz)   SpO2 99%   BMI 27.21 kg/m   HEMODYNAMICS: CVP:  [12 mmHg] 12 mmHg  VENTILATOR SETTINGS:    INTAKE / OUTPUT: I/O last 3 completed shifts: In: 5231.5 [I.V.:681.5; IV Piggyback:4550] Out: 31 [Urine:80]  PHYSICAL EXAMINATION: General: Ill-appearing woman, in no distress, lying in bed Neuro: Wakes easily and interacts appropriately, answers all questions and follows commands HEENT: Oropharynx dry, no oral  lesions Cardiovascular: Irregularly irregular, no murmur Lungs: Clear breath sounds bilaterally Abdomen: Nondistended, slightly tender diffusely, positive bowel sounds Musculoskeletal: Left lower extremity with patchy erythema, edema.  Appears to be demarcating Skin: Dry, no rash  LABS:  BMET Recent Labs  Lab 04/24/18 2345 04/25/18 0318 04/25/18 0651  NA 130* 132* 132*  K 3.9 4.0 3.9  CL 97* 100* 102  CO2 19* 17* 18*  BUN 60* 57* 56*  CREATININE 8.12* 7.54* 7.41*  GLUCOSE 147* 169* 144*    Electrolytes Recent Labs  Lab 04/24/18 2345 04/25/18 0318 04/25/18 0651  CALCIUM 7.2* 7.4* 7.3*  MG  --  1.1*  --   PHOS  --  7.0*  --     CBC Recent Labs  Lab 04/24/18 2345 04/25/18 0318  WBC 19.6* 24.7*  HGB 7.3* 7.5*  HCT 23.4* 24.2*  PLT 311 379    Coag's No results for input(s): APTT, INR in the last 168 hours.  Sepsis Markers Recent Labs  Lab 04/25/18 0000 04/25/18 0318  LATICACIDVEN 1.14  --   PROCALCITON  --  1.59    ABG No results for input(s): PHART, PCO2ART, PO2ART in the last 168 hours.  Liver Enzymes Recent Labs  Lab 04/24/18 2345  AST 26  ALT 10*  ALKPHOS 131*  BILITOT 0.6  ALBUMIN 1.8*    Cardiac Enzymes Recent Labs  Lab 04/25/18 0318 04/25/18 0643  TROPONINI 0.13* 1.56*  Glucose Recent Labs  Lab 04/25/18 0339 04/25/18 0732  GLUCAP 171* 170*    Imaging US Renal  Result Date: 04/25/2018 CLINICAL DATA:  Initial evaluation for acute renal injury. EXAM: RENAL / URINARY TRACT ULTRASOUND COMPLETE COMPARISON:  Prior CT from 11/30/2017 FINDINGS: Right Kidney: Length: 14.4 cm. Echogenicity within normal limits. No mass or hydronephrosis visualized. Left Kidney: Length: 13.7 cm. Echogenicity within normal limits. No mass or hydronephrosis visualized. Bladder: Bladder decompressed with a Foley catheter in place. IMPRESSION: 1. Negative renal ultrasound.  No hydronephrosis. 2. Bladder decompressed with a Foley catheter in place.  Electronically Signed   By: Jeannine Boga M.D.   On: 04/25/2018 06:55   Dg Chest Port 1 View  Result Date: 04/25/2018 CLINICAL DATA:  Central line placement. EXAM: PORTABLE CHEST 1 VIEW COMPARISON:  Chest radiograph performed 04/24/2018 FINDINGS: The patient's left the IJ line is noted ending about the proximal SVC. Mild vascular congestion is noted. Mild bibasilar atelectasis is seen. No pleural effusion or pneumothorax is seen, though the lung bases are incompletely characterized. The cardiomediastinal silhouette is borderline normal in size. No acute osseous abnormalities are identified. IMPRESSION: 1. Left IJ line noted ending about the proximal SVC. No pneumothorax. 2. Mild vascular congestion noted; mild bibasilar atelectasis seen. Electronically Signed   By: Garald Balding M.D.   On: 04/25/2018 03:57   Dg Chest Portable 1 View  Result Date: 04/24/2018 CLINICAL DATA:  Initial evaluation for acute sepsis. EXAM: PORTABLE CHEST 1 VIEW COMPARISON:  Prior radiograph from 12/02/2017. FINDINGS: Moderate cardiomegaly, stable. Mediastinal silhouette within normal limits. No focal infiltrates. No pulmonary edema or visible pleural effusion. No pneumothorax. Osseous structures within normal limits. IMPRESSION: 1. No active cardiopulmonary disease. 2. Stable cardiomegaly without pulmonary edema. Electronically Signed   By: Jeannine Boga M.D.   On: 04/24/2018 22:19     STUDIES:  CXR 6/7 > Moderate cardiomegaly, stable. Mediastinal silhouette within normal Limits. No focal infiltrates. No pulmonary edema or visible pleural effusion. No pneumothorax. Osseous structures within normal limits  CULTURES: Blood 6/7 >> U/A 6/7 >>   ANTIBIOTICS: Vancomycin 6/7 >> Zosyn 6/7 >>  SIGNIFICANT EVENTS: 6/7 > Presents to ED   LINES/TUBES: PIV   DISCUSSION: 52 year old female presents in Septic Shock secondary to gangrenous left foot   ASSESSMENT / PLAN:  Septic Shock in setting of gangrenous  left foot  Elevated troponin, possible stress related non-ST elevation MI in setting of hypotension H/O A.Fib on Xarelto, PAD  P:  Continue norepinephrine, wean as able Volume resuscitation based on CVP Appreciate vascular surgery input, planning for left AKA 6/10 Continue to follow troponin Continue heparin drip, Xarelto on hold in preparation for surgery  Acute Kidney Injury in setting of septic shock vs Rhabdo; oliguria Mild rhabdomyolysis, CK 1049 Crt 8.12 >> 7.41 LA 1.14  Renal ultrasound 6/8 normal P:   Follow BMP, urine output, CPK No clear indication for dialysis at this time.  We will consult nephrology if she does not respond to volume resuscitation, antibiotics Replace electrolytes as indicated Continue current volume resuscitation, normal saline 100 cc/h, sodium bicarbonate 30 cc/h  GERD Nausea/Vomiting  P:   Okay to liberalize diet, start clears Zofran as needed Pepcid for SUP  Anemia  P:  Follow CBC on heparin Initiate iron replacement when taking p.o.  Gangrenous Left Leg  -WBC 19.6, Febrile (Tmax 101)  P:   Empiric broad-spectrum antibiotics If she declines in any way then consider imaging left lower extremity to rule out  gas, tracking infection Follow culture data Definitive therapy, amputation planned for 6/10   DM   -Random cortisol 24 P:   Follow CBG Continue sliding scale insulin as ordered   FAMILY  - Updates: Husband updated at bedside 6/7, not present 6/8.  Patient updated  - Inter-disciplinary family meet or Palliative Care meeting due by: 05/02/2018    Independent CC time 53 minutes  Baltazar Apo, MD, PhD 04/25/2018, 9:00 AM [facility] [phone] or if no answer [phone]

## 2018-04-25 NOTE — Progress Notes (Signed)
Attempted arterial line placement in left radial artery. Positive blood flow on both attempts but was unable to thread catheter into artery. CCM is aware. Pressure held over site foe 5 minutes, no hematoma.

## 2018-04-26 ENCOUNTER — Inpatient Hospital Stay (HOSPITAL_COMMUNITY): Payer: Medicare Other | Admitting: Anesthesiology

## 2018-04-26 ENCOUNTER — Encounter (HOSPITAL_COMMUNITY)
Admission: EM | Disposition: A | Discharge status: Rehabilitation Facility | Payer: Self-pay | Source: Home / Self Care | Attending: Family Medicine

## 2018-04-26 ENCOUNTER — Encounter (HOSPITAL_COMMUNITY): Payer: Self-pay | Admitting: Nurse Practitioner

## 2018-04-26 DIAGNOSIS — I7092 Chronic total occlusion of artery of the extremities: Secondary | ICD-10-CM

## 2018-04-26 DIAGNOSIS — I70248 Atherosclerosis of native arteries of left leg with ulceration of other part of lower left leg: Secondary | ICD-10-CM

## 2018-04-26 HISTORY — PX: AMPUTATION: SHX166

## 2018-04-26 LAB — BASIC METABOLIC PANEL
ANION GAP: 16 — AB (ref 5–15)
ANION GAP: 16 — AB (ref 5–15)
BUN: 56 mg/dL — ABNORMAL HIGH (ref 6–20)
BUN: 55 mg/dL — AB (ref 6–20)
CHLORIDE: 102 mmol/L (ref 101–111)
CHLORIDE: 99 mmol/L — AB (ref 101–111)
CO2: 16 mmol/L — ABNORMAL LOW (ref 22–32)
CO2: 14 mmol/L — ABNORMAL LOW (ref 22–32)
Calcium: 7.6 mg/dL — ABNORMAL LOW (ref 8.9–10.3)
Calcium: 7.8 mg/dL — ABNORMAL LOW (ref 8.9–10.3)
Creatinine, Ser: 7.63 mg/dL — ABNORMAL HIGH (ref 0.44–1.00)
Creatinine, Ser: 7.8 mg/dL — ABNORMAL HIGH (ref 0.44–1.00)
GFR calc Af Amer: 6 mL/min — ABNORMAL LOW (ref >60)
GFR calc non Af Amer: 5 mL/min — ABNORMAL LOW (ref >60)
GFR, EST AFRICAN AMERICAN: 6 mL/min — AB (ref >60)
GFR, EST NON AFRICAN AMERICAN: 5 mL/min — AB (ref >60)
GLUCOSE: 136 mg/dL — AB (ref 65–99)
Glucose, Bld: 194 mg/dL — ABNORMAL HIGH (ref 65–99)
POTASSIUM: 4.1 mmol/L (ref 3.5–5.1)
POTASSIUM: 4.4 mmol/L (ref 3.5–5.1)
SODIUM: 129 mmol/L — AB (ref 135–145)
Sodium: 134 mmol/L — ABNORMAL LOW (ref 135–145)

## 2018-04-26 LAB — URINE CULTURE: Culture: NO GROWTH

## 2018-04-26 LAB — CBC
HCT: 24.5 % — ABNORMAL LOW (ref 36.0–46.0)
HCT: 23.9 % — ABNORMAL LOW (ref 36.0–46.0)
Hemoglobin: 7.7 g/dL — ABNORMAL LOW (ref 12.0–15.0)
Hemoglobin: 7.5 g/dL — ABNORMAL LOW (ref 12.0–15.0)
MCH: 23.5 pg — ABNORMAL LOW (ref 26.0–34.0)
MCH: 23.5 pg — ABNORMAL LOW (ref 26.0–34.0)
MCHC: 31.4 g/dL (ref 30.0–36.0)
MCHC: 31.4 g/dL (ref 30.0–36.0)
MCV: 74.9 fL — ABNORMAL LOW (ref 78.0–100.0)
MCV: 74.9 fL — ABNORMAL LOW (ref 78.0–100.0)
Platelets: 417 K/uL — ABNORMAL HIGH (ref 150–400)
Platelets: 407 K/uL — ABNORMAL HIGH (ref 150–400)
RBC: 3.27 MIL/uL — ABNORMAL LOW (ref 3.87–5.11)
RBC: 3.19 MIL/uL — ABNORMAL LOW (ref 3.87–5.11)
RDW: 17.6 % — ABNORMAL HIGH (ref 11.5–15.5)
RDW: 17.6 % — ABNORMAL HIGH (ref 11.5–15.5)
WBC: 22.9 K/uL — ABNORMAL HIGH (ref 4.0–10.5)
WBC: 21.6 K/uL — ABNORMAL HIGH (ref 4.0–10.5)

## 2018-04-26 LAB — GLUCOSE, CAPILLARY
GLUCOSE-CAPILLARY: 95 mg/dL (ref 65–99)
GLUCOSE-CAPILLARY: 141 mg/dL — AB (ref 65–99)
Glucose-Capillary: 106 mg/dL — ABNORMAL HIGH (ref 65–99)
Glucose-Capillary: 132 mg/dL — ABNORMAL HIGH (ref 65–99)
Glucose-Capillary: 139 mg/dL — ABNORMAL HIGH (ref 65–99)
Glucose-Capillary: 175 mg/dL — ABNORMAL HIGH (ref 65–99)
Glucose-Capillary: 224 mg/dL — ABNORMAL HIGH (ref 65–99)

## 2018-04-26 LAB — SURGICAL PCR SCREEN
MRSA, PCR: NEGATIVE
Staphylococcus aureus: POSITIVE — AB

## 2018-04-26 LAB — PREPARE RBC (CROSSMATCH)

## 2018-04-26 LAB — MRSA PCR SCREENING: MRSA by PCR: NEGATIVE

## 2018-04-26 LAB — VANCOMYCIN, RANDOM: Vancomycin Rm: 12

## 2018-04-26 LAB — PHOSPHORUS: Phosphorus: 7.9 mg/dL — ABNORMAL HIGH (ref 2.5–4.6)

## 2018-04-26 LAB — PROCALCITONIN: Procalcitonin: 2.88 ng/mL

## 2018-04-26 LAB — LACTIC ACID, PLASMA: LACTIC ACID, VENOUS: 0.7 mmol/L (ref 0.5–1.9)

## 2018-04-26 LAB — MAGNESIUM: MAGNESIUM: 1.1 mg/dL — AB (ref 1.7–2.4)

## 2018-04-26 LAB — CK: CK TOTAL: 529 U/L — AB (ref 38–234)

## 2018-04-26 LAB — TROPONIN I
TROPONIN I: 6.5 ng/mL — AB (ref <0.03)
TROPONIN I: 2.37 ng/mL — AB (ref <0.03)

## 2018-04-26 SURGERY — AMPUTATION, ABOVE KNEE
Anesthesia: General | Laterality: Left

## 2018-04-26 MED ORDER — NITROGLYCERIN 0.4 MG SL SUBL: 0.4000 mg | SUBLINGUAL_TABLET | SUBLINGUAL | Status: DC | PRN

## 2018-04-26 MED ORDER — SODIUM CHLORIDE 0.9 % IV SOLN: Freq: Once | INTRAVENOUS | Status: AC

## 2018-04-26 MED ORDER — VANCOMYCIN HCL IN DEXTROSE 1-5 GM/200ML-% IV SOLN
1000.0000 mg | INTRAVENOUS | Status: DC
  Administered 2018-04-26 – 2018-04-28 (×2): 1000 mg via INTRAVENOUS
  Filled 2018-04-26 (×2): qty 200

## 2018-04-26 MED ORDER — 0.9 % SODIUM CHLORIDE (POUR BTL) OPTIME
TOPICAL | Status: DC | PRN
  Administered 2018-04-26: 1000 mL

## 2018-04-26 MED ORDER — HEPARIN (PORCINE) IN NACL 100-0.45 UNIT/ML-% IJ SOLN
1550.0000 [IU]/h | INTRAMUSCULAR | Status: DC
  Administered 2018-04-27: 1100 [IU]/h via INTRAVENOUS
  Administered 2018-04-28: 1150 [IU]/h via INTRAVENOUS
  Administered 2018-04-29: 1300 [IU]/h via INTRAVENOUS
  Administered 2018-04-30: 1350 [IU]/h via INTRAVENOUS
  Administered 2018-05-01: 1550 [IU]/h via INTRAVENOUS
  Administered 2018-05-01: 1350 [IU]/h via INTRAVENOUS
  Administered 2018-05-02 – 2018-05-03 (×2): 1550 [IU]/h via INTRAVENOUS
  Administered 2018-05-04 – 2018-05-05 (×3): 1600 [IU]/h via INTRAVENOUS
  Filled 2018-04-26 (×11): qty 250

## 2018-04-26 MED ORDER — MUPIROCIN 2 % EX OINT
1.0000 "application " | TOPICAL_OINTMENT | Freq: Two times a day (BID) | CUTANEOUS | Status: DC
  Administered 2018-04-26 – 2018-04-30 (×9): 1 via NASAL
  Filled 2018-04-26 (×2): qty 22

## 2018-04-26 MED ORDER — SODIUM CHLORIDE 0.9 % IV SOLN
INTRAVENOUS | Status: AC
  Filled 2018-04-26: qty 1.2

## 2018-04-26 MED ORDER — HYDROMORPHONE HCL 2 MG/ML IJ SOLN
INTRAMUSCULAR | Status: AC
  Administered 2018-04-26: 0.5 mg via INTRAVENOUS
  Filled 2018-04-26: qty 1

## 2018-04-26 MED ORDER — SODIUM CHLORIDE 0.9 % IV SOLN
INTRAVENOUS | Status: DC | PRN
  Administered 2018-04-26: 10:00:00 via INTRAVENOUS

## 2018-04-26 MED ORDER — PROPOFOL 10 MG/ML IV BOLUS
INTRAVENOUS | Status: DC | PRN
  Administered 2018-04-26: 50 mg via INTRAVENOUS

## 2018-04-26 MED ORDER — PROPOFOL 10 MG/ML IV BOLUS
INTRAVENOUS | Status: AC
  Filled 2018-04-26: qty 20

## 2018-04-26 MED ORDER — MORPHINE SULFATE (PF) 4 MG/ML IV SOLN
4.0000 mg | INTRAVENOUS | Status: DC | PRN
  Administered 2018-04-26 – 2018-04-27 (×4): 4 mg via INTRAVENOUS
  Administered 2018-04-28: 2 mg via INTRAVENOUS
  Filled 2018-04-26 (×5): qty 1

## 2018-04-26 MED ORDER — DEXAMETHASONE SODIUM PHOSPHATE 10 MG/ML IJ SOLN
INTRAMUSCULAR | Status: DC | PRN
  Administered 2018-04-26: 10 mg via INTRAVENOUS

## 2018-04-26 MED ORDER — MORPHINE SULFATE (PF) 2 MG/ML IV SOLN: 2.0000 mg | INTRAVENOUS | Status: DC | PRN

## 2018-04-26 MED ORDER — SUGAMMADEX SODIUM 200 MG/2ML IV SOLN
INTRAVENOUS | Status: DC | PRN
  Administered 2018-04-26: 200 mg via INTRAVENOUS

## 2018-04-26 MED ORDER — ONDANSETRON HCL 4 MG/2ML IJ SOLN
INTRAMUSCULAR | Status: DC | PRN
  Administered 2018-04-26: 4 mg via INTRAVENOUS

## 2018-04-26 MED ORDER — NOREPINEPHRINE 16 MG/250ML-% IV SOLN
0.0000 ug/min | INTRAVENOUS | Status: DC
  Administered 2018-04-26: 11 ug/min via INTRAVENOUS
  Filled 2018-04-26 (×5): qty 250

## 2018-04-26 MED ORDER — MIDAZOLAM HCL 2 MG/2ML IJ SOLN
INTRAMUSCULAR | Status: AC
  Filled 2018-04-26: qty 2

## 2018-04-26 MED ORDER — FENTANYL CITRATE (PF) 250 MCG/5ML IJ SOLN
INTRAMUSCULAR | Status: DC | PRN
  Administered 2018-04-26: 50 ug via INTRAVENOUS

## 2018-04-26 MED ORDER — ROCURONIUM BROMIDE 10 MG/ML (PF) SYRINGE
PREFILLED_SYRINGE | INTRAVENOUS | Status: DC | PRN
Start: 2018-04-26 — End: 2018-04-26
  Administered 2018-04-26: 50 mg via INTRAVENOUS

## 2018-04-26 MED ORDER — SODIUM CHLORIDE 0.9 % IV SOLN
Freq: Once | INTRAVENOUS | Status: AC
  Administered 2018-04-26: 50 mL via INTRAVENOUS

## 2018-04-26 MED ORDER — CHLORHEXIDINE GLUCONATE CLOTH 2 % EX PADS
6.0000 | MEDICATED_PAD | Freq: Every day | CUTANEOUS | Status: DC
  Administered 2018-04-28 (×2): 6 via TOPICAL

## 2018-04-26 MED ORDER — HYDROMORPHONE HCL 2 MG/ML IJ SOLN
0.2500 mg | INTRAMUSCULAR | Status: DC | PRN
  Administered 2018-04-26 (×2): 0.5 mg via INTRAVENOUS

## 2018-04-26 MED ORDER — FENTANYL CITRATE (PF) 250 MCG/5ML IJ SOLN
INTRAMUSCULAR | Status: AC
Start: 2018-04-26 — End: ?
  Filled 2018-04-26: qty 5

## 2018-04-26 SURGICAL SUPPLY — 45 items
BANDAGE ACE 6X5 VEL STRL LF (GAUZE/BANDAGES/DRESSINGS) ×2 IMPLANT
BLADE SAW RECIP 87.9 MT (BLADE) ×2 IMPLANT
BNDG COHESIVE 4X5 TAN STRL (GAUZE/BANDAGES/DRESSINGS) ×1 IMPLANT
BNDG COHESIVE 6X5 TAN STRL LF (GAUZE/BANDAGES/DRESSINGS) ×2 IMPLANT
BNDG GAUZE ELAST 4 BULKY (GAUZE/BANDAGES/DRESSINGS) ×2 IMPLANT
CANISTER SUCT 3000ML PPV (MISCELLANEOUS) ×2 IMPLANT
CLIP VESOCCLUDE MED 6/CT (CLIP) ×1 IMPLANT
COVER BACK TABLE 60X90IN (DRAPES) ×1 IMPLANT
COVER PROBE W GEL 5X96 (DRAPES) ×1 IMPLANT
COVER SURGICAL LIGHT HANDLE (MISCELLANEOUS) ×2 IMPLANT
DRAIN CHANNEL 19F RND (DRAIN) IMPLANT
DRAPE HALF SHEET 40X57 (DRAPES) ×2 IMPLANT
DRAPE ORTHO SPLIT 77X108 STRL (DRAPES) ×4
DRAPE SURG ORHT 6 SPLT 77X108 (DRAPES) ×2 IMPLANT
DRSG ADAPTIC 3X8 NADH LF (GAUZE/BANDAGES/DRESSINGS) ×2 IMPLANT
ELECT CAUTERY BLADE 6.4 (BLADE) ×2 IMPLANT
ELECT REM PT RETURN 9FT ADLT (ELECTROSURGICAL) ×2
ELECTRODE REM PT RTRN 9FT ADLT (ELECTROSURGICAL) ×1 IMPLANT
EVACUATOR SILICONE 100CC (DRAIN) IMPLANT
GAUZE SPONGE 4X4 12PLY STRL (GAUZE/BANDAGES/DRESSINGS) ×2 IMPLANT
GAUZE SPONGE 4X4 12PLY STRL LF (GAUZE/BANDAGES/DRESSINGS) ×1 IMPLANT
GLOVE BIO SURGEON STRL SZ7.5 (GLOVE) ×3 IMPLANT
GLOVE BIOGEL PI IND STRL 7.0 (GLOVE) IMPLANT
GLOVE BIOGEL PI INDICATOR 7.0 (GLOVE) ×3
GLOVE SURG SS PI 6.5 STRL IVOR (GLOVE) ×3 IMPLANT
GOWN STRL REUS W/ TWL LRG LVL3 (GOWN DISPOSABLE) ×3 IMPLANT
GOWN STRL REUS W/TWL LRG LVL3 (GOWN DISPOSABLE) ×6
KIT BASIN OR (CUSTOM PROCEDURE TRAY) ×2 IMPLANT
KIT TURNOVER KIT B (KITS) ×2 IMPLANT
NS IRRIG 1000ML POUR BTL (IV SOLUTION) ×2 IMPLANT
PACK GENERAL/GYN (CUSTOM PROCEDURE TRAY) ×2 IMPLANT
PAD ARMBOARD 7.5X6 YLW CONV (MISCELLANEOUS) ×4 IMPLANT
STAPLER VISISTAT 35W (STAPLE) ×2 IMPLANT
STOCKINETTE IMPERVIOUS LG (DRAPES) ×2 IMPLANT
SUT ETHILON 3 0 PS 1 (SUTURE) IMPLANT
SUT SILK 2 0 SH (SUTURE) IMPLANT
SUT SILK 2 0 SH CR/8 (SUTURE) ×2 IMPLANT
SUT SILK 2 0 TIES 10X30 (SUTURE) ×2 IMPLANT
SUT VIC AB 2-0 CT1 18 (SUTURE) ×3 IMPLANT
SUT VIC AB 2-0 SH 18 (SUTURE) ×3 IMPLANT
SUT VIC AB 3-0 SH 27 (SUTURE) ×4
SUT VIC AB 3-0 SH 27X BRD (SUTURE) ×2 IMPLANT
TOWEL GREEN STERILE (TOWEL DISPOSABLE) ×4 IMPLANT
UNDERPAD 30X30 (UNDERPADS AND DIAPERS) ×2 IMPLANT
WATER STERILE IRR 1000ML POUR (IV SOLUTION) ×2 IMPLANT

## 2018-04-26 NOTE — Anesthesia Preprocedure Evaluation (Addendum)
Anesthesia Evaluation  Patient identified by MRN, date of birth, ID band Patient awake    Reviewed: Allergy & Precautions, H&P , NPO status , Patient's Chart, lab work & pertinent test results, reviewed documented beta blocker date and time   Airway Mallampati: II  TM Distance: >3 FB Neck ROM: Full    Dental no notable dental hx. (+) Edentulous Upper, Poor Dentition, Dental Advisory Given   Pulmonary asthma , former smoker,    Pulmonary exam normal breath sounds clear to auscultation       Cardiovascular hypertension, Pt. on medications and Pt. on home beta blockers + Past MI and + Peripheral Vascular Disease  + dysrhythmias Atrial Fibrillation  Rhythm:Regular Rate:Normal     Neuro/Psych Depression negative neurological ROS     GI/Hepatic Neg liver ROS, GERD  Medicated and Controlled,  Endo/Other  diabetes, Insulin Dependent  Renal/GU ARFRenal disease  negative genitourinary   Musculoskeletal   Abdominal   Peds  Hematology negative hematology ROS (+) anemia ,   Anesthesia Other Findings   Reproductive/Obstetrics negative OB ROS                            Anesthesia Physical Anesthesia Plan  ASA: IV and emergent  Anesthesia Plan: General   Post-op Pain Management:    Induction: Intravenous  PONV Risk Score and Plan: 4 or greater and Ondansetron, Midazolam and Treatment may vary due to age or medical condition  Airway Management Planned: Oral ETT  Additional Equipment: Arterial line  Intra-op Plan:   Post-operative Plan: Extubation in OR and Possible Post-op intubation/ventilation  Informed Consent: I have reviewed the patients History and Physical, chart, labs and discussed the procedure including the risks, benefits and alternatives for the proposed anesthesia with the patient or authorized representative who has indicated his/her understanding and acceptance.   Dental  advisory given  Plan Discussed with: CRNA  Anesthesia Plan Comments:         Anesthesia Quick Evaluation

## 2018-04-26 NOTE — Anesthesia Postprocedure Evaluation (Signed)
Anesthesia Post Note  Patient: [name]  Procedure(s) Performed: AMPUTATION ABOVE KNEE (Left )     Patient location during evaluation: PACU Anesthesia Type: General Level of consciousness: awake and alert Pain management: pain level controlled Vital Signs Assessment: post-procedure vital signs reviewed and stable Respiratory status: spontaneous breathing, nonlabored ventilation, respiratory function stable and patient connected to nasal cannula oxygen Cardiovascular status: blood pressure returned to baseline and stable Postop Assessment: no apparent nausea or vomiting Anesthetic complications: no    Last Vitals:  Vitals:   04/26/18 1145 04/26/18 1200  BP: 99/66 (!) 100/59  Pulse: (!) 101 (!) 101  Resp: 13 (!) 3  Temp:  36.7 C  SpO2: 100% 100%    Last Pain:  Vitals:   04/26/18 1200  TempSrc: Temporal  PainSc:                  Theoren Palka,W. EDMOND

## 2018-04-26 NOTE — Progress Notes (Signed)
PULMONARY / CRITICAL CARE MEDICINE   Name: [name] MRN: [id number] DOB: Aug 14, 1966    ADMISSION DATE:  04/24/2018 CONSULTATION DATE:  04/25/2018  REFERRING MD:  Dr. 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 Post-Op patient lost pulse to left foot and was taken back to OR for thrombectomy. On 6/3 patient went to outpatient vascular appointment where she was found to by hypotensive and febrile with a occlusion noted to bypass graft. Patient was told at this time to got to ED however, she was going on a camping trip in which she did not want to miss. Sent home with clindamycin.   On 6/7 patient presented to ED with AMS with pain to left lower leg and emesis and diarrhea. On arrival BP 71/48. LA 1.14. Given 3.5L NS without improvement, started on levophed gtt. Administered Vancomycin and Zosyn. LLE with no pulse and gangrenous. Vascular Consulted. Plans for BKA on Monday. PCCM asked to admit.  Patient states she took Xarelto last on 6/7.     SUBJECTIVE:  Comfortable.  No change in her norepinephrine dosing, currently 10 Heparin drip infusing Note progressive acidosis over last 24 hours   VITAL SIGNS: BP 102/68   Pulse 94   Temp 99.7 F (37.6 C) (Oral)   Resp 12   Ht 5\' 7"  (1.702 m)   Wt 84.3 kg (185 lb 13.6 oz)   SpO2 100%   BMI 29.11 kg/m   HEMODYNAMICS: CVP:  [9 mmHg-15 mmHg] 12 mmHg  VENTILATOR SETTINGS:    INTAKE / OUTPUT: I/O last 3 completed shifts: In: 9904.6 [P.O.:240; I.V.:4714.6; IV Piggyback:4950] Out: 732 [Urine:232; Emesis/NG output:500]  PHYSICAL EXAMINATION: General: Ill-appearing woman, awake, in no distress Neuro: Alert, responds to questions, forgetful and repeating herself but no focality HEENT: Oropharynx dry,  no oral lesions Cardiovascular: Irregularly irregular, no murmur Lungs: Decreased breath sounds at both bases otherwise clear Abdomen: Nondistended, slightly diffusely tender, positive bowel sounds Musculoskeletal: Left lower extremity with erythema distally, foot demarcating, warm Skin: Dry, no rash  LABS:  BMET Recent Labs  Lab 04/25/18 0651 04/25/18 1741 04/26/18 0404  NA 132* 128* 134*  K 3.9 4.0 4.1  CL 102 98* 102  CO2 18* 16* 16*  BUN 56* 56* 56*  CREATININE 7.41* 7.65* 7.63*  GLUCOSE 144* 107* 136*    Electrolytes Recent Labs  Lab 04/25/18 0318 04/25/18 0651 04/25/18 1741 04/26/18 0404  CALCIUM 7.4* 7.3* 7.3* 7.6*  MG 1.1*  --   --  1.1*  PHOS 7.0*  --   --  7.9*    CBC Recent Labs  Lab 04/25/18 0318 04/26/18 0100 04/26/18 0404  WBC 24.7* 22.9* 21.6*  HGB 7.5* 7.7* 7.5*  HCT 24.2* 24.5* 23.9*  PLT 379 417* 407*    Coag's Recent Labs  Lab 04/25/18 1223 04/25/18 2215  APTT 70* 47*    Sepsis Markers Recent Labs  Lab 04/25/18 0000 04/25/18 0318 04/26/18 0404  LATICACIDVEN 1.14  --   --   PROCALCITON  --  1.59 2.88    ABG No results for input(s): PHART, PCO2ART, PO2ART in the last 168 hours.  Liver Enzymes Recent Labs  Lab 04/24/18 2345  AST 26  ALT 10*  ALKPHOS 131*  BILITOT  0.6  ALBUMIN 1.8*    Cardiac Enzymes Recent Labs  Lab 04/25/18 1223 04/25/18 2215 04/26/18 0404  TROPONINI 5.95* 7.71* 6.50*    Glucose Recent Labs  Lab 04/25/18 1153 04/25/18 1609 04/25/18 1938 04/25/18 2347 04/26/18 0337 04/26/18 0722  GLUCAP 104* 96 106* 144* 132* 139*    Imaging No results found.   STUDIES:  CXR 6/7 > Moderate cardiomegaly, stable. Mediastinal silhouette within normal Limits. No focal infiltrates. No pulmonary edema or visible pleural effusion. No pneumothorax. Osseous structures within normal limits  CULTURES: Blood 6/7 >> U/A 6/7 >>   ANTIBIOTICS: Vancomycin 6/7 >> Zosyn 6/7 >>  SIGNIFICANT EVENTS: 6/7  > Presents to ED   LINES/TUBES: PIV   DISCUSSION: 52 year old female presents in Septic Shock secondary to gangrenous left foot   ASSESSMENT / PLAN:  Hypoxemia P: Currently stable but anticipate that she will likely return from the OR on mechanical ventilation.  Septic Shock in setting of gangrenous left foot  Elevated troponin, possible stress related non-ST elevation MI in setting of hypotension H/O A.Fib on Xarelto, PAD  P:  Continue norepinephrine, wean as able.  We may not be able to make much progress here until we get source control Volume resuscitation based on her CVP Appreciate vascular surgery input, planning for left AKA today, 6/9.  Anticipate that she will likely return to the ICU intubated and sedated Continue to follow troponin, already on heparin  Acute Kidney Injury in setting of septic shock vs Rhabdo; oliguria Mild rhabdomyolysis, CK 1049 Crt 8.12 >> 7.41 LA 1.14  Renal ultrasound 6/8 normal P:   CPK has peaked, follow BMP, urine output No clear indication for dialysis at this time, consider nephrology consultation if she does not respond to source control, volume resuscitation, antibiotics Replace electrolytes as indicated Continue normal saline 100 cc/h Sodium bicarbonate 30 cc/h, weaned to off when renal function stabilizing  GERD Nausea/Vomiting  P:   N.p.o. in preparation for surgery today Zofran as needed Pepcid for SUP  Anemia  Iron deficiency P:  Heparin on hold currently in preparation for surgery.  Likely restart on return Follow CBC Initiate iron replacement when taking good p.o.  Gangrenous Left Leg  -WBC 19.6, Febrile (Tmax 101)  P:   Empiric broad-spectrum antibiotics as ordered, vancomycin Zosyn Definitive therapy planned for 6/9 Follow culture data  DM   -Random cortisol 24 P:   CBG Continue sliding scale insulin as ordered   FAMILY  - Updates: Husband updated at bedside 6/7, not present 6/8 or 6/9.  Patient  updated  - Inter-disciplinary family meet or Palliative Care meeting due by: 05/02/2018    Independent CC time 74 minutes  Baltazar Apo, MD, PhD 04/26/2018, 8:42 AM [facility] [phone] or if no answer [phone]

## 2018-04-26 NOTE — Progress Notes (Signed)
Pharmacy Antibiotic Note  [name] is a 52 y.o. female admitted on 04/24/2018 with left foot gangrene.  Pharmacy has been consulted for Vancomycin/Zosyn dosing.  Previous doses were put in using Scr from May which was normal. Labs are back and pt is now in acute renal failure with CrCl <10. Will adjust doses.   6/9 AM update: Random vancomycin level this AM is 12, this is about 30 hours after one time dose of 1000 mg of vancomycin   Plan:  -Will dose vancomycin at 1000 mg IV q48h for now -Consider checking a random level prior to the 6/11 dose   Height: 5\' 7"  (170.2 cm) Weight: 185 lb 13.6 oz (84.3 kg) IBW/kg (Calculated) : 61.6  Temp (24hrs), Avg:99.2 F (37.3 C), Min:97.9 F (36.6 C), Max:100.1 F (37.8 C)  Recent Labs  Lab 04/24/18 2345 04/25/18 0000 04/25/18 0318 04/25/18 0651 04/25/18 1741 04/26/18 0100 04/26/18 0404  WBC 19.6*  --  24.7*  --   --  22.9* 21.6*  CREATININE 8.12*  --  7.54* 7.41* 7.65*  --   --   LATICACIDVEN  --  1.14  --   --   --   --   --   VANCORANDOM  --   --   --   --   --   --  12    Estimated Creatinine Clearance: 9.5 mL/min (A) (by C-G formula based on SCr of 7.65 mg/dL (H)).    No Known Allergies  [name] 04/26/2018 5:06 AM

## 2018-04-26 NOTE — Op Note (Signed)
[facility] OPERATIVE NOTE  Procedure: Left above knee amputation  Surgeon(s): Elam Dutch, MD  ASSISTANT: Arlee Muslim, PA-C  Anesthesia: General  Specimens: left leg  PROCEDURE DETAIL: After obtaining informed consent, the patient was taken to the operating room. The patient was placed in supine position the operating room table. After induction of general anesthesia and endotracheal intubation the patient's entire left lower extremity was prepped and draped in usual sterile fashion. A circumferential incision was made on the left leg just above the knee. There was copious edema fluid.  The incision was carried down into the sucutaneous tissues down to level the saphenous vein. This was ligated and divided between silk ties. Soft tissues were taken down as well as the muscle and fascia with cautery. The superficial femoral artery and vein were dissected free circumferentially clamped and divided. These were suture ligated proximally. Remainder of the soft tissues were taken down with cautery. The periosteum was raised on the femur approximately 5 cm above the skin edge. The femur was divided at this level. The leg was passed off the table as a specimen. Hemostasis was obtained. The wound was thoroughly irrigated with normal saline solution. The fascial edges were reapproximated using interrupted 2 0 Vicryl sutures. The subcutaneous tissues reapproximated using a running 3-0 Vicryl suture. The skin was closed staples. Patient tolerated procedure well and there were no complications. Instrument sponge and needle counts correct in the case. Patient was taken to recovery in stable condition.  Ruta Hinds, MD [facility] Office: [phone] Pager: [phone]

## 2018-04-26 NOTE — Anesthesia Procedure Notes (Signed)
Arterial Line Insertion Start/End6/07/2018 10:19 AM, 04/26/2018 10:24 AM Performed by: [name], MD, anesthesiologist  Patient location: OR. Preanesthetic checklist: patient identified, IV checked, site marked, risks and benefits discussed, surgical consent, monitors and equipment checked, pre-op evaluation, timeout performed and anesthesia consent Lidocaine 1% used for infiltration Right, brachial was placed Catheter size: 20 G Hand hygiene performed , maximum sterile barriers used  and Seldinger technique used  Attempts: 1 Procedure performed using ultrasound guided technique. Ultrasound Notes:anatomy identified, needle tip was noted to be adjacent to the nerve/plexus identified, no ultrasound evidence of intravascular and/or intraneural injection and image(s) printed for medical record Following insertion, dressing applied and Biopatch. Post procedure assessment: normal and unchanged  Patient tolerated the procedure well with no immediate complications.

## 2018-04-26 NOTE — Progress Notes (Signed)
[facility]  Subjective  - feels about the same   Objective 100/62 93 99.7 F (37.6 C) (Oral) 13 100%  Intake/Output Summary (Last 24 hours) at 04/26/2018 0812 Last data filed at 04/26/2018 0700 Gross per 24 hour  Intake 4504.15 ml  Output 152 ml  Net 4352.15 ml   Left leg edema to thigh level Erythema to mid calf  Assessment/Planning: Sepsis secondary to left foot infarction She has basically plateaued after 24 hours of resuscitation Will proceed with left AkA emergently this morning  Ruta Hinds 04/26/2018 8:12 AM --  Laboratory Lab Results: Recent Labs    04/26/18 0100 04/26/18 0404  WBC 22.9* 21.6*  HGB 7.7* 7.5*  HCT 24.5* 23.9*  PLT 417* 407*   BMET Recent Labs    04/25/18 1741 04/26/18 0404  NA 128* 134*  K 4.0 4.1  CL 98* 102  CO2 16* 16*  GLUCOSE 107* 136*  BUN 56* 56*  CREATININE 7.65* 7.63*  CALCIUM 7.3* 7.6*    COAG Lab Results  Component Value Date   INR 1.15 03/24/2018   INR 1.40 11/28/2017   INR 1.30 11/28/2017   No results found for: PTT

## 2018-04-26 NOTE — Progress Notes (Signed)
[place] for Heparin (holding Xarelto) Indication: atrial fibrillation  No Known Allergies  Patient Measurements: Height: 5\' 7"  (170.2 cm) Weight: 185 lb 13.6 oz (84.3 kg) IBW/kg (Calculated) : 61.6  Vital Signs: Temp: 97.7 F (36.5 C) (06/09 1245) Temp Source: Temporal (06/09 1235) BP: 104/64 (06/09 1319) Pulse Rate: 104 (06/09 1319)  Labs: Recent Labs    04/25/18 0318  04/25/18 0651 04/25/18 1223 04/25/18 1741 04/25/18 2215 04/26/18 0100 04/26/18 0404  HGB 7.5*  --   --   --   --   --  7.7* 7.5*  HCT 24.2*  --   --   --   --   --  24.5* 23.9*  PLT 379  --   --   --   --   --  417* 407*  APTT  --   --   --  70*  --  47*  --   --   HEPARINUNFRC  --   --   --  1.87*  --   --   --   --   CREATININE 7.54*  --  7.41*  --  7.65*  --   --  7.63*  CKTOTAL 1,049*  --   --   --   --   --   --  529*  TROPONINI 0.13*   < >  --  5.95*  --  7.71*  --  6.50*   < > = values in this interval not displayed.    Estimated Creatinine Clearance: 9.5 mL/min (A) (by C-G formula based on SCr of 7.63 mg/dL (H)).   Medical History: Past Medical History:  Diagnosis Date  . Abnormal stress test    a. 02/2017 MV: large region of fixed perfusion defect in basal to mid inf, mid-dist inflat walls, EF 43%. No ischemia (EF 55-60% by f/u echo).  . Arthritis   . Asthma   . Chronic back pain   . Coronary artery calcification seen on CT scan    a. 11/2017 CT Abd/Pelvis: Multi vessel coronary vascular Ca2+.  . Depression   . Diabetes mellitus   . Diabetic neuropathy ([place])   . Family history of adverse reaction to anesthesia    mother had difficlty waking   . 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 GERD (gastroesophageal reflux disease)   . History of echocardiogram    a. 03/2017 Echo: EF 55-60%, mild LVH, nl RV fxn.  . Hyperlipidemia   . 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 PAD (peripheral artery disease) ([place])   .  Paroxysmal atrial fibrillation with rapid ventricular response ([place]) 12/02/2017   a. CHA2DS2VASc = 3-->Xarelto.  Marland Kitchen Ulcer    Foot    Assessment: 52 y/o F on Xarelto PTA for afib, now with a gangrenous foot leading to sepsis requiring vasopressor support, holding Xarelto and started heparin during critical illness. IV heparin was held this AM for above knee amputation. Discussed with [name] who asked to hold IV heparin today and resume tomorrow AM at 0700. H/H low stable. Plt 407k    Goal of Therapy:  Heparin level 0.3-0.7 units/ml aPTT 66-102 seconds Monitor platelets by anticoagulation protocol: Yes   Plan:  -Start IV heparin 1100 units/hr at 0700 tomorrow. No bolus  -F/u 8 HL/aPTT  -Monitor daily HL, aPTT and s/s of bleeding   Albertina Parr, PharmD., BCPS Clinical Pharmacist Clinical phone for 04/26/18 until 3:30pm: [phone] If after 3:30pm, please call main pharmacy  at: [phone]

## 2018-04-26 NOTE — Transfer of Care (Signed)
Immediate Anesthesia Transfer of Care Note  Patient: [name]  Procedure(s) Performed: AMPUTATION ABOVE KNEE (Left )  Patient Location: PACU  Anesthesia Type:General  Level of Consciousness: awake and alert   Airway & Oxygen Therapy: Patient Spontanous Breathing and Patient connected to nasal cannula oxygen  Post-op Assessment: Report given to RN and Post -op Vital signs reviewed and stable  Post vital signs: Reviewed and stable  Last Vitals:  Vitals Value Taken Time  BP 112/74 04/26/2018 11:29 AM  Temp 36.7 C 04/26/2018 11:30 AM  Pulse 102 04/26/2018 11:37 AM  Resp 14 04/26/2018 11:37 AM  SpO2 100 % 04/26/2018 11:37 AM  Vitals shown include unvalidated device data.  Last Pain:  Vitals:   04/26/18 0720  TempSrc: Oral  PainSc:          Complications: No apparent anesthesia complications

## 2018-04-26 NOTE — Anesthesia Procedure Notes (Signed)
Procedure Name: Intubation Date/Time: 04/26/2018 10:11 AM Performed by: Clearnce Sorrel, CRNA Pre-anesthesia Checklist: Patient identified, Emergency Drugs available, Suction available, Patient being monitored and Timeout performed Patient Re-evaluated:Patient Re-evaluated prior to induction Oxygen Delivery Method: Circle system utilized Preoxygenation: Pre-oxygenation with 100% oxygen Induction Type: IV induction Ventilation: Mask ventilation without difficulty Laryngoscope Size: Mac and 3 Grade View: Grade I Tube type: Subglottic suction tube Tube size: 7.0 mm Number of attempts: 1 Airway Equipment and Method: Stylet Placement Confirmation: ETT inserted through vocal cords under direct vision,  positive ETCO2 and breath sounds checked- equal and bilateral Secured at: 23 cm Tube secured with: Tape Dental Injury: Teeth and Oropharynx as per pre-operative assessment

## 2018-04-27 ENCOUNTER — Inpatient Hospital Stay (HOSPITAL_COMMUNITY): Payer: Medicare Other

## 2018-04-27 ENCOUNTER — Encounter (HOSPITAL_COMMUNITY): Payer: Self-pay | Admitting: Vascular Surgery

## 2018-04-27 DIAGNOSIS — I214 Non-ST elevation (NSTEMI) myocardial infarction: Secondary | ICD-10-CM

## 2018-04-27 DIAGNOSIS — G9340 Encephalopathy, unspecified: Secondary | ICD-10-CM

## 2018-04-27 DIAGNOSIS — I739 Peripheral vascular disease, unspecified: Secondary | ICD-10-CM

## 2018-04-27 DIAGNOSIS — Z794 Long term (current) use of insulin: Secondary | ICD-10-CM

## 2018-04-27 DIAGNOSIS — I480 Paroxysmal atrial fibrillation: Secondary | ICD-10-CM

## 2018-04-27 DIAGNOSIS — I5033 Acute on chronic diastolic (congestive) heart failure: Secondary | ICD-10-CM

## 2018-04-27 DIAGNOSIS — E11628 Type 2 diabetes mellitus with other skin complications: Secondary | ICD-10-CM

## 2018-04-27 LAB — BPAM RBC
BLOOD PRODUCT EXPIRATION DATE: 201907082359
Blood Product Expiration Date: 201907062359
Blood Product Expiration Date: 201907072359
Blood Product Expiration Date: 201907082359
ISSUE DATE / TIME: 201906091441
ISSUE DATE / TIME: 201906091043
ISSUE DATE / TIME: 201906091208
ISSUE DATE / TIME: 201906091208
UNIT TYPE AND RH: 9500
UNIT TYPE AND RH: 9500
Unit Type and Rh: 9500
Unit Type and Rh: 9500

## 2018-04-27 LAB — TYPE AND SCREEN
ABO/RH(D): O NEG
Antibody Screen: NEGATIVE
UNIT DIVISION: 0
UNIT DIVISION: 0
Unit division: 0
Unit division: 0

## 2018-04-27 LAB — GLUCOSE, CAPILLARY
GLUCOSE-CAPILLARY: 155 mg/dL — AB (ref 65–99)
GLUCOSE-CAPILLARY: 144 mg/dL — AB (ref 65–99)
GLUCOSE-CAPILLARY: 125 mg/dL — AB (ref 65–99)
Glucose-Capillary: 154 mg/dL — ABNORMAL HIGH (ref 65–99)
Glucose-Capillary: 155 mg/dL — ABNORMAL HIGH (ref 65–99)
Glucose-Capillary: 125 mg/dL — ABNORMAL HIGH (ref 65–99)
Glucose-Capillary: 147 mg/dL — ABNORMAL HIGH (ref 65–99)
Glucose-Capillary: 155 mg/dL — ABNORMAL HIGH (ref 65–99)

## 2018-04-27 LAB — BASIC METABOLIC PANEL
ANION GAP: 13 (ref 5–15)
BUN: 58 mg/dL — AB (ref 6–20)
CO2: 18 mmol/L — AB (ref 22–32)
CREATININE: 7.79 mg/dL — AB (ref 0.44–1.00)
Calcium: 8 mg/dL — ABNORMAL LOW (ref 8.9–10.3)
Chloride: 102 mmol/L (ref 101–111)
GFR calc Af Amer: 6 mL/min — ABNORMAL LOW (ref >60)
GFR, EST NON AFRICAN AMERICAN: 5 mL/min — AB (ref >60)
GLUCOSE: 185 mg/dL — AB (ref 65–99)
Potassium: 3.9 mmol/L (ref 3.5–5.1)
Sodium: 133 mmol/L — ABNORMAL LOW (ref 135–145)

## 2018-04-27 LAB — MAGNESIUM: Magnesium: 1.4 mg/dL — ABNORMAL LOW (ref 1.7–2.4)

## 2018-04-27 LAB — CBC
HCT: 31 % — ABNORMAL LOW (ref 36.0–46.0)
Hemoglobin: 9.9 g/dL — ABNORMAL LOW (ref 12.0–15.0)
MCH: 24.3 pg — AB (ref 26.0–34.0)
MCHC: 31.9 g/dL (ref 30.0–36.0)
MCV: 76 fL — AB (ref 78.0–100.0)
Platelets: 311 10*3/uL (ref 150–400)
RBC: 4.08 MIL/uL (ref 3.87–5.11)
RDW: 17.6 % — ABNORMAL HIGH (ref 11.5–15.5)
WBC: 12.3 10*3/uL — ABNORMAL HIGH (ref 4.0–10.5)

## 2018-04-27 LAB — APTT: aPTT: 72 seconds — ABNORMAL HIGH (ref 24–36)

## 2018-04-27 LAB — PROCALCITONIN: PROCALCITONIN: 2.36 ng/mL

## 2018-04-27 LAB — LACTIC ACID, PLASMA: Lactic Acid, Venous: 0.8 mmol/L (ref 0.5–1.9)

## 2018-04-27 LAB — CK: Total CK: 221 U/L (ref 38–234)

## 2018-04-27 LAB — HEPARIN LEVEL (UNFRACTIONATED): Heparin Unfractionated: 0.28 IU/mL — ABNORMAL LOW (ref 0.30–0.70)

## 2018-04-27 MED ORDER — MAGNESIUM SULFATE 2 GM/50ML IV SOLN
2.0000 g | Freq: Once | INTRAVENOUS | Status: AC
  Administered 2018-04-27: 2 g via INTRAVENOUS
  Filled 2018-04-27: qty 50

## 2018-04-27 NOTE — Consult Note (Addendum)
Cardiology Consultation   The patient has been seen in conjunction with Lorella Nimrod, MD. All aspects of care have been considered and discussed. The patient has been personally interviewed, examined, and all clinical data has been reviewed.   Non ST elevation myocardial infarction, highly likely to be related to obstructive coronary disease.  Contribution from hypotension, vasopressor agents, and hypoxia.  Continued severe hypotension requiring vasopressor therapy in setting of lactic acidosis and probable sepsis.  Left ventricular function at this time is unclear.  Previously known function was normal 1 year ago.  Acute kidney failure  Recommend: Conservative cardiac management.  At some later date, will need coronary angiography as there is high likelihood that obstructive CAD is present.  When blood pressure allows, low-dose beta-blocker and long-acting nitrate therapy would be recommended.  If kidney function improves ACE/ARB therapy would be beneficial.  2D Doppler echocardiogram is ordered to assess current LV systolic function.  We will follow with you     Patient ID: [name], Slider [id number], [birth year] Admit date: 04/24/2018 Date of Consult: 04/27/2018  Primary Physician: Delight Stare Primary Cardiologist: Dr. Harrell Gave End Referring Physician: Dr. Nelda Marseille  Chief Complaint: Left lower extremity gangrene with sepsis. Reason for Consultation: Elevated troponin.  HPI: This is a 52 y.o. female with a past medical history significant for DM, extensive PAD, A.Fib on Xarelto, Chronic Wounds of Left Foot with H/O of ABF and Fem-Pop Bypass, recently admitted from May 10-14 for redo of left femoral to PTA bypass with cryopreserved vein.  On June 3 during her follow-up office visit at vascular surgery found to be hypotensive and febrile, she was told to go to ED at that time but patient does not want to her preplanned camping trip and went home on clindamycin.  She presented to ED  on April 24, 2018 with altered mental status, left lower extremity pain and vomiting, found to be hypotensive with gangrenous pulseless left lower extremity, requiring Levophed since then.  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  Patient denies any chest pain, exertional dyspnea, palpitations, orthopnea or PND.  She denies any previous cardiac history.  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  According to patient she was told about the blindness of the right side before that CT head, unable to explain the reason or any follow-up with ophthalmology.  Myoview done in April 2018 shows a large region of fixed perfusion defect in the basal to mid inferior wall concerning for a previous MI, EF noted during that Myoview was 43%. Echo done in May 2018 shows normal ejection fraction with no wall motion abnormalities.  EKG shows Q waves in lead III and aVF, with some T wave abnormalities on admission, most likely secondary to CAD, as patient has an extensive history of PAD.  Inferior Q waves was present on previous EKGs too.  Her initial troponin on presentation was 0.13 which peaked on April 25, 2018 at 7.71, now trending down with last reading of 2.37 on April 26, 2018.  PMHx:  CARDIAC HISTORY: Echo:  04/17/17.: Study Conclusions  - Technically difficult study due to chest wall and/or lung   interference. - Left ventricle: The cavity size was normal. Wall thickness was   increased in a pattern of mild LVH. Systolic function was normal.   The estimated  ejection fraction was in the range of 55% to 60%.   The study is not technically sufficient to allow evaluation of LV   diastolic function. - Mitral valve: Calcified annulus. Mildly thickened leaflets . - Right ventricle: The cavity size was normal. Systolic function   was  normal. - Pericardium, extracardiac: A trivial pericardial effusion was   identified.   Past Medical History:  Diagnosis Date  . Abnormal stress test    a. 02/2017 MV: large region of fixed perfusion defect in basal to mid inf, mid-dist inflat walls, EF 43%. No ischemia (EF 55-60% by f/u echo).  . Arthritis   . Asthma   . Chronic back pain   . Coronary artery calcification seen on CT scan    a. 11/2017 CT Abd/Pelvis: Multi vessel coronary vascular Ca2+.  . Depression   . Diabetes mellitus   . Diabetic neuropathy ([place])   . Family history of adverse reaction to anesthesia    mother had difficlty waking   . Femoral-popliteal bypass graft occlusion, left ([place]) 12/02/2017  . GERD (gastroesophageal reflux disease)   . History of echocardiogram    a. 03/2017 Echo: EF 55-60%, mild LVH, nl RV fxn.  . Hyperlipidemia   . 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 PAD (peripheral artery disease) ([place])   . Paroxysmal atrial fibrillation with rapid ventricular response ([place]) 12/02/2017   a. CHA2DS2VASc = 3-->Xarelto.  Marland Kitchen Ulcer    Foot   Past Surgical History:  Procedure Laterality Date  . ABDOMINAL AORTAGRAM  June 15, 2014  . ABDOMINAL AORTAGRAM N/A 06/15/2014   Procedure: ABDOMINAL Maxcine Ham;  Surgeon: Serafina Mitchell, MD;  Location[facility] C[facility] LAB;  Service: Cardiovascular;  Laterality: N/A;  . ABDOMINAL AORTAGRAM N/A 11/22/2014   Procedure: ABDOMINAL AORTAGRAM;  Surgeon: Serafina Mitchell, MD;  Location[facility] CATH LAB;  Service: Cardiovascular;  Laterality: N/A;  . ABDOMINAL AORTOGRAM W/LOWER EXTREMITY N/A 01/07/2017   Procedure: Abdominal Aortogram w/Lower Extremity;  Surgeon: Serafina Mitchell, MD;  Location: [place] CV LAB;  Service: Cardiovascular;  Laterality: N/A;  . ABDOMINAL AORTOGRAM W/LOWER EXTREMITY N/A 10/31/2017   Procedure: ABDOMINAL AORTOGRAM W/LOWER EXTREMITY;  Surgeon: Elam Dutch, MD;  Location: [place] CV LAB;  Service: Cardiovascular;  Laterality: N/A;  .  ABDOMINAL AORTOGRAM W/LOWER EXTREMITY N/A 03/24/2018   Procedure: ABDOMINAL AORTOGRAM W/LOWER EXTREMITY;  Surgeon: Serafina Mitchell, MD;  Location: [place] CV LAB;  Service: Cardiovascular;  Laterality: N/A;  . AMPUTATION Left 04/26/2018   Procedure: AMPUTATION ABOVE KNEE;  Surgeon: Elam Dutch, MD;  Location: [place];  Service: Vascular;  Laterality: Left;  . AORTA - BILATERAL FEMORAL ARTERY BYPASS GRAFT N/A 11/28/2017   Procedure: AORTA BIFEMORAL BYPASS USING HEMASHIELD GOLD GRAFT & REIMPLANT IMA;  Surgeon: Serafina Mitchell, MD;  Location: [facility] OR;  Service: Vascular;  Laterality: N/A;  . AORTIC ARCH ANGIOGRAPHY N/A 10/31/2017   Procedure: AORTIC ARCH ANGIOGRAPHY;  Surgeon: Elam Dutch, MD;  Location: [place] CV LAB;  Service: Cardiovascular;  Laterality: N/A;  . APPLICATION OF WOUND VAC  11/28/2017   Procedure: APPLICATION OF WOUND VAC;  Surgeon: Serafina Mitchell, MD;  [facility] OR;  Service: Vascular;;  . APPLICATION OF WOUND VAC Left 03/27/2018   Procedure: APPLICATION OF WOUND VAC LEFT GROIN USING PREVENA PLUS;  Surgeon: Serafina Mitchell, MD;  Location: [place];  Service: Vascular;  Laterality: Left;  . ARTERIAL BYPASS SURGERY   07/05/2010   Right Common Femoral to below knee  popliteal BPG  . BACK SURGERY     X's  2  . CARDIAC CATHETERIZATION    . CHOLECYSTECTOMY     Gall Bladder  . CYSTECTOMY Right    foot  . CYSTECTOMY Left    wrist  . EMBOLECTOMY Left 11/28/2017   Procedure: Left Lower Extremity Embolectomy, Left Tibial Peroneal Trunk Endarterectomy with Patch Angioplasty; Vein Harvest Small Saphenous Graft Left Lower Leg;  Surgeon: [name], MD;  Location: [place];  Service: Vascular;  Laterality: Left;  . FEMORAL-POPLITEAL BYPASS GRAFT Left 03/27/2018   Procedure: THROMBECTOMY OF LEFT FEMORAL TIBIAL BYPASS;  Surgeon: Serafina Mitchell, MD;  Location: [place];  Service: Vascular;  Laterality: Left;  . FEMORAL-TIBIAL BYPASS GRAFT Left 03/27/2018   Procedure:  BYPASS GRAFT FEMORAL-TIBIAL ARTERY LEFT REDO USING CRYOPRESERVED SAPHENOUS VEIN 70cm;  Surgeon: Serafina Mitchell, MD;  Location: [facility] OR;  Service: Vascular;  Laterality: Left;  . INTERCOSTAL NERVE BLOCK  November 2015  . INTRAOPERATIVE ARTERIOGRAM  11/28/2017   Procedure: INTRA OPERATIVE ARTERIOGRAM OF LEFT LEG;  Surgeon: Serafina Mitchell, MD;  Location: [place];  Service: Vascular;;  . INTRAOPERATIVE ARTERIOGRAM Left 03/27/2018   Procedure: INTRA OPERATIVE ARTERIOGRAM TIMES TWO;  Surgeon: Serafina Mitchell, MD;  Location: [place];  Service: Vascular;  Laterality: Left;  . IR FLUORO GUIDE CV LINE RIGHT  03/20/2017  . IR US GUIDE VASC ACCESS RIGHT  03/20/2017  . IRRIGATION AND DEBRIDEMENT BUTTOCKS Right 09/30/2016   Procedure: DEBRIDEMENT RIGHT  BUTTOCK WOUND;  Surgeon: Georganna Skeans, MD;  Location: [place];  Service: General;  Laterality: Right;  . left foot surgery    . left wrist cyst removal Left   . PERIPHERAL VASCULAR CATHETERIZATION N/A 05/07/2016   Procedure: Abdominal Aortogram;  Surgeon: Serafina Mitchell, MD;  Location: [place] CV LAB;  Service: Cardiovascular;  Laterality: N/A;  . PERIPHERAL VASCULAR CATHETERIZATION N/A 05/07/2016   Procedure: Lower Extremity Angiography;  Surgeon: Serafina Mitchell, MD;  Location: [place] CV LAB;  Service: Cardiovascular;  Laterality: N/A;  . PERIPHERAL VASCULAR CATHETERIZATION N/A 05/07/2016   Procedure: Aortic Arch Angiography;  Surgeon: Serafina Mitchell, MD;  Location: [place] CV LAB;  Service: Cardiovascular;  Laterality: N/A;  . PERIPHERAL VASCULAR CATHETERIZATION N/A 05/07/2016   Procedure: Upper Extremity Angiography;  Surgeon: Serafina Mitchell, MD;  Location: [place] CV LAB;  Service: Cardiovascular;  Laterality: N/A;  . PERIPHERAL VASCULAR CATHETERIZATION Right 05/07/2016   Procedure: Peripheral Vascular Balloon Angioplasty;  Surgeon: Serafina Mitchell, MD;  Location: [place] CV LAB;  Service: Cardiovascular;  Laterality: Right;  subclavian  .  PERIPHERAL VASCULAR CATHETERIZATION Right 05/07/2016   Procedure: Peripheral Vascular Intervention;  Surgeon: Serafina Mitchell, MD;  Location: [place] CV LAB;  Service: Cardiovascular;  Laterality: Right;  External  Iliac  . SKIN GRAFT Right 2012   [name] by Dr. Nils Pyle- Right and Left Ankle  . SPINE SURGERY    . THROMBECTOMY FEMORAL ARTERY  11/28/2017   Procedure: THROMBECTOMY  & REVISION OF BILATERAL FEMORAL TO POPLETEAL ARTERIES;  Surgeon: Serafina Mitchell, MD;  [facility];  Service: Vascular;;  . TONSILLECTOMY      FAMHx: Family History  Problem Relation Age of Onset  . Coronary artery disease Mother   . Peripheral vascular disease Mother   . Heart disease Mother        Before age 28  . Other Mother        Venous insuffiency  . Diabetes  Mother   . Hyperlipidemia Mother   . Hypertension Mother   . Varicose Veins Mother   . Heart attack Mother        before age 68  . Heart disease Father   . Diabetes Father   . Diabetes Maternal Grandmother   . Diabetes Paternal Grandmother   . Diabetes Paternal Grandfather   . Diabetes Sister   . Hypertension Sister   . Diabetes Brother   . Hypertension Brother     SOCHx:  reports that she quit smoking about 14 months ago. Her smoking use included cigarettes. She quit after 30.00 years of use. She has never used smokeless tobacco. She reports that she does not drink alcohol or use drugs.  ALLERGIES: No Known Allergies   HOME MEDICATIONS: Medications Prior to Admission  Medication Sig Dispense Refill Last Dose  . albuterol (PROVENTIL) 2 MG tablet Take 2 mg by mouth 3 (three) times daily as needed for shortness of breath.    unknown  . aspirin EC 81 MG tablet Take 81 mg by mouth every evening.   04/23/2018 at Unknown time  . cetirizine (ZYRTEC) 10 MG tablet Take 10 mg by mouth daily.    04/23/2018 at Unknown time  . clindamycin (CLEOCIN) 300 MG capsule Take 1 capsule (300 mg total) by mouth 3 (three) times daily. 21 capsule 1 04/24/2018  at Unknown time  . doxycycline (VIBRA-TABS) 100 MG tablet Take 1 tablet (100 mg total) by mouth 2 (two) times daily. (Patient taking differently: Take 100 mg by mouth daily. ) 60 tablet 4 04/24/2018 at Unknown time  . HUMALOG KWIKPEN 100 UNIT/ML KiwkPen Inject 10 Units into the skin See admin instructions. 10 units three times a day before meals per sliding scale   Past Week at Unknown time  . HYDROcodone-acetaminophen (NORCO) 10-325 MG tablet Take 1 tablet by mouth 2 (two) times daily as needed for severe pain.    04/24/2018 at Unknown time  . LANTUS SOLOSTAR 100 UNIT/ML Solostar Pen Inject 36 Units into the skin 2 (two) times daily.    Past Week at Unknown time  . loperamide (IMODIUM A-D) 2 MG tablet Take 12 mg by mouth daily as needed for diarrhea or loose stools.   unknown  . metoprolol tartrate (LOPRESSOR) 25 MG tablet Take 0.5 tablets (12.5 mg total) by mouth 2 (two) times daily. 90 tablet 3 Past Week at Unknown time  . omeprazole (PRILOSEC) 20 MG capsule Take 20 mg by mouth daily.   04/23/2018 at Unknown time  . ondansetron (ZOFRAN-ODT) 4 MG disintegrating tablet DISSOLVE 1 TABLET IN MOUTH EVERY 8 HOURS AS NEEDED FOR NAUSEA OR VOMITING 20 tablet 0 unknown  . pregabalin (LYRICA) 100 MG capsule Limit 1 tab by mouth twice a day to 3 times a day if tolerated (Patient taking differently: Take 100 mg by mouth 2 (two) times daily. ) 90 capsule 0 04/23/2018 at Unknown time  . rivaroxaban (XARELTO) 20 MG TABS tablet Take 1 tablet (20 mg total) by mouth daily with supper. 30 tablet 2 04/23/2018 at Unknown time  . rosuvastatin (CRESTOR) 20 MG tablet Take 1 tablet (20 mg total) by mouth daily at 6 PM. 30 tablet 2 04/23/2018 at Unknown time    HOSPITAL MEDICATIONS: . Chlorhexidine Gluconate Cloth  6 each Topical Daily  . insulin aspart  0-15 Units Subcutaneous Q4H  . mouth rinse  15 mL Mouth Rinse BID  . mupirocin ointment  1 application Nasal BID    ROS General:  Negative; No fevers, chills, or night sweats;    HEENT: Fixed dilated blind right eye, No sinus congestion, difficulty swallowing Pulmonary: Negative; No cough, wheezing, shortness of breath, hemoptysis Cardiovascular: Negative; No chest pain, presyncope, syncope, palpitations GI: Negative; No nausea, vomiting, diarrhea, or abdominal pain GU: Negative; No dysuria, hematuria, or difficulty voiding Musculoskeletal: Pain in left stamp. Hematologic/Oncology: Negative; no easy bruising, bleeding Endocrine: Negative; no heat/cold intolerance; no diabetes Neuro: Negative; no changes in balance, headaches Skin: Negative; No rashes or skin lesions   VITALS: Blood pressure (!) 87/75, pulse 76, temperature 97.7 F (36.5 C), temperature source Oral, resp. rate 16, height '5\' 7"'  (1.702 m), weight 182 lb 5.1 oz (82.7 kg), SpO2 99 %.  PHYSICAL EXAM:  General: Vital signs reviewed.  Patient is well-developed and well-nourished, in no acute distress and cooperative with exam.  Head: Normocephalic and atraumatic. Eyes: Right pupil dilated and fixed, conjunctivae normal, no scleral icterus.  Neck: Supple, trachea midline, normal ROM, no JVD, masses, thyromegaly, or carotid bruit present.  Cardiovascular: RRR, S1 normal, S2 normal, no murmurs, gallops, or rubs. Pulmonary/Chest: Clear to auscultation bilaterally, no wheezes, rales, or rhonchi. Abdominal: Soft, non-tender, non-distended, BS +, no masses, organomegaly, or guarding present.  Musculoskeletal: LLE AKA, with Ace wrap. Extremities: Trace right lower extremity edema.  Pulses 2+ on right foot. Neurological: A&O x3, strength and sensations grossly intact.  ECG (independently read by me): Sinus rhythm, Q waves in lead II and III.  LABS: Results for orders placed or performed during the hospital encounter of 04/24/18 (from the past 48 hour(s))  Glucose, capillary     Status: None   Collection Time: 04/25/18  4:09 PM  Result Value Ref Range   Glucose-Capillary 96 65 - 99 mg/dL   Comment 1  Capillary Specimen    Comment 2 Notify RN   Basic metabolic panel     Status: Abnormal   Collection Time: 04/25/18  5:41 PM  Result Value Ref Range   Sodium 128 (L) 135 - 145 mmol/L   Potassium 4.0 3.5 - 5.1 mmol/L   Chloride 98 (L) 101 - 111 mmol/L   CO2 16 (L) 22 - 32 mmol/L   Glucose, Bld 107 (H) 65 - 99 mg/dL   BUN 56 (H) 6 - 20 mg/dL   Creatinine, Ser 7.65 (H) 0.44 - 1.00 mg/dL   Calcium 7.3 (L) 8.9 - 10.3 mg/dL   GFR calc non Af Amer 5 (L) >60 mL/min   GFR calc Af Amer 6 (L) >60 mL/min    Comment: (NOTE) The eGFR has been calculated using the CKD EPI equation. This calculation has not been validated in all clinical situations. eGFR's persistently <60 mL/min signify possible Chronic Kidney Disease.    Anion gap 14 5 - 15    Comment: Performed at [facility] [address], [place], [place] [address]  Glucose, capillary     Status: Abnormal   Collection Time: 04/25/18  7:38 PM  Result Value Ref Range   Glucose-Capillary 106 (H) 65 - 99 mg/dL   Comment 1 Notify RN   Surgical PCR screen     Status: Abnormal   Collection Time: 04/25/18  9:02 PM  Result Value Ref Range   MRSA, PCR NEGATIVE NEGATIVE   Staphylococcus aureus POSITIVE (A) NEGATIVE    Comment: (NOTE) The Xpert SA Assay (FDA approved for NASAL specimens in patients 91 years of age and older), is one component of a comprehensive surveillance program. It is not intended  to diagnose infection nor to guide or monitor treatment.   APTT     Status: Abnormal   Collection Time: 04/25/18 10:15 PM  Result Value Ref Range   aPTT 47 (H) 24 - 36 seconds    Comment:        IF BASELINE aPTT IS ELEVATED, SUGGEST PATIENT RISK ASSESSMENT BE USED TO DETERMINE APPROPRIATE ANTICOAGULANT THERAPY. Performed at [facility], [place] [address] [place], [place] [address]   Troponin I (q 6hr x 3)     Status: Abnormal   Collection Time: 04/25/18 10:15 PM  Result Value Ref Range   Troponin I 7.71 (HH) <0.03 ng/mL     Comment: CRITICAL VALUE NOTED.  VALUE IS CONSISTENT WITH PREVIOUSLY REPORTED AND CALLED VALUE. Performed at [facility], [place] [address], [place], [place] [address]   Glucose, capillary     Status: Abnormal   Collection Time: 04/25/18 11:47 PM  Result Value Ref Range   Glucose-Capillary 144 (H) 65 - 99 mg/dL   Comment 1 Notify RN   CBC     Status: Abnormal   Collection Time: 04/26/18  1:00 AM  Result Value Ref Range   WBC 22.9 (H) 4.0 - 10.5 K/uL   RBC 3.27 (L) 3.87 - 5.11 MIL/uL   Hemoglobin 7.7 (L) 12.0 - 15.0 g/dL   HCT 24.5 (L) 36.0 - 46.0 %   MCV 74.9 (L) 78.0 - 100.0 fL   MCH 23.5 (L) 26.0 - 34.0 pg   MCHC 31.4 30.0 - 36.0 g/dL   RDW 17.6 (H) 11.5 - 15.5 %   Platelets 417 (H) 150 - 400 K/uL    Comment: Performed at [facility], [place] [address], [place], [place] [address]  Glucose, capillary     Status: Abnormal   Collection Time: 04/26/18  3:37 AM  Result Value Ref Range   Glucose-Capillary 132 (H) 65 - 99 mg/dL   Comment 1 Notify RN   Procalcitonin     Status: None   Collection Time: 04/26/18  4:04 AM  Result Value Ref Range   Procalcitonin 2.88 ng/mL    Comment:        Interpretation: PCT > 2 ng/mL: Systemic infection (sepsis) is likely, unless other causes are known. (NOTE)       Sepsis PCT Algorithm           Lower Respiratory Tract                                      Infection PCT Algorithm    ----------------------------     ----------------------------         PCT < 0.25 ng/mL                PCT < 0.10 ng/mL         Strongly encourage             Strongly discourage   discontinuation of antibiotics    initiation of antibiotics    ----------------------------     -----------------------------       PCT 0.25 - 0.50 ng/mL            PCT 0.10 - 0.25 ng/mL               OR       >80% decrease in PCT  Discourage initiation of                                            antibiotics      Encourage discontinuation           of antibiotics     ----------------------------     -----------------------------         PCT >= 0.50 ng/mL              PCT 0.26 - 0.50 ng/mL               AND       <80% decrease in PCT              Encourage initiation of                                             antibiotics       Encourage continuation           of antibiotics    ----------------------------     -----------------------------        PCT >= 0.50 ng/mL                  PCT > 0.50 ng/mL               AND         increase in PCT                  Strongly encourage                                      initiation of antibiotics    Strongly encourage escalation           of antibiotics                                     -----------------------------                                           PCT <= 0.25 ng/mL                                                 OR                                        > 80% decrease in PCT                                     Discontinue / Do not initiate  antibiotics Performed at [facility], [place] [address] [place], [place] [address]   Vancomycin, random     Status: None   Collection Time: 04/26/18  4:04 AM  Result Value Ref Range   Vancomycin Rm 12     Comment:        Random Vancomycin therapeutic range is dependent on dosage and time of specimen collection. A peak range is 20.0-40.0 ug/mL A trough range is 5.0-15.0 ug/mL        Performed at [facility] [address], [place], [place] [address]   CK     Status: Abnormal   Collection Time: 04/26/18  4:04 AM  Result Value Ref Range   Total CK 529 (H) 38 - 234 U/L    Comment: Performed at [facility], [place] [address], [name], [place] [address]  Basic metabolic panel     Status: Abnormal   Collection Time: 04/26/18  4:04 AM  Result Value Ref Range   Sodium 134 (L) 135 - 145 mmol/L   Potassium 4.1 3.5 - 5.1 mmol/L   Chloride 102 101 - 111 mmol/L   CO2 16 (L) 22 - 32 mmol/L    Glucose, Bld 136 (H) 65 - 99 mg/dL   BUN 56 (H) 6 - 20 mg/dL   Creatinine, Ser 7.63 (H) 0.44 - 1.00 mg/dL   Calcium 7.6 (L) 8.9 - 10.3 mg/dL   GFR calc non Af Amer 5 (L) >60 mL/min   GFR calc Af Amer 6 (L) >60 mL/min    Comment: (NOTE) The eGFR has been calculated using the CKD EPI equation. This calculation has not been validated in all clinical situations. eGFR's persistently <60 mL/min signify possible Chronic Kidney Disease.    Anion gap 16 (H) 5 - 15    Comment: Performed at [facility], [place] [address] [place], [place] [address]  Magnesium     Status: Abnormal   Collection Time: 04/26/18  4:04 AM  Result Value Ref Range   Magnesium 1.1 (L) 1.7 - 2.4 mg/dL    Comment: Performed at [facility] [address], [place],  [address]  Phosphorus     Status: Abnormal   Collection Time: 04/26/18  4:04 AM  Result Value Ref Range   Phosphorus 7.9 (H) 2.5 - 4.6 mg/dL    Comment: Performed at [facility] [address] [place], [place] [address]  CBC     Status: Abnormal   Collection Time: 04/26/18  4:04 AM  Result Value Ref Range   WBC 21.6 (H) 4.0 - 10.5 K/uL   RBC 3.19 (L) 3.87 - 5.11 MIL/uL   Hemoglobin 7.5 (L) 12.0 - 15.0 g/dL   HCT 23.9 (L) 36.0 - 46.0 %   MCV 74.9 (L) 78.0 - 100.0 fL   MCH 23.5 (L) 26.0 - 34.0 pg   MCHC 31.4 30.0 - 36.0 g/dL   RDW 17.6 (H) 11.5 - 15.5 %   Platelets 407 (H) 150 - 400 K/uL    Comment: Performed at [facility], [place] [address], [place], [place] [address]  Troponin I (q 6hr x 3)     Status: Abnormal   Collection Time: 04/26/18  4:04 AM  Result Value Ref Range   Troponin I 6.50 (HH) <0.03 ng/mL    Comment: CRITICAL VALUE NOTED.  VALUE IS CONSISTENT WITH PREVIOUSLY REPORTED AND CALLED VALUE. Performed at [facility], [place],  [place] [address]   Glucose, capillary     Status: Abnormal   Collection Time: 04/26/18  7:22 AM  Result Value Ref Range   Glucose-Capillary 139 (H) 65 - 99  mg/dL   Comment 1 Capillary Specimen   MRSA PCR Screening     Status: None   Collection Time: 04/26/18  8:19 AM  Result Value Ref Range   MRSA by PCR NEGATIVE NEGATIVE    Comment:        The GeneXpert MRSA Assay (FDA approved for NASAL specimens only), is one component of a comprehensive MRSA colonization surveillance program. It is not intended to diagnose MRSA infection nor to guide or monitor treatment for MRSA infections. Performed at [facility], [place] [address], [place], [place] [address]   Type and screen     Status: None   Collection Time: 04/26/18  9:47 AM  Result Value Ref Range   ABO/RH(D) O NEG    Antibody Screen NEG    Sample Expiration 04/29/2018    Unit Number [id number]    Blood Component Type RED CELLS,LR    Unit division 00    Status of Unit ISSUED,FINAL    Transfusion Status OK TO TRANSFUSE    Crossmatch Result      Compatible Performed at [facility], [place] [address] [place], [place] [address]    Unit Number [id number]    Blood Component Type RED CELLS,LR    Unit division 00    Status of Unit ISSUED,FINAL    Transfusion Status OK TO TRANSFUSE    Crossmatch Result Compatible    Unit Number [id number]    Blood Component Type RED CELLS,LR    Unit division 00    Status of Unit REL FROM [facility]    Transfusion Status OK TO TRANSFUSE    Crossmatch Result Compatible    Unit Number [id number]    Blood Component Type RED CELLS,LR    Unit division 00    Status of Unit REL [facility]    Transfusion Status OK TO TRANSFUSE    Crossmatch Result Compatible   Prepare RBC     Status: None   Collection Time: 04/26/18 10:19 AM  Result Value Ref Range   Order Confirmation      ORDER PROCESSED BY BLOOD BANK Performed at [facility], [place] [address] [place], [place] [address]   Glucose, capillary     Status: None   Collection Time: 04/26/18 11:37 AM  Result Value Ref Range   Glucose-Capillary 95 65 - 99 mg/dL  Prepare RBC      Status: None   Collection Time: 04/26/18 12:06 PM  Result Value Ref Range   Order Confirmation      ORDER PROCESSED BY BLOOD BANK Performed at [facility], [place] [address], [place], [place] [address]   Glucose, capillary     Status: Abnormal   Collection Time: 04/26/18  1:41 PM  Result Value Ref Range   Glucose-Capillary 106 (H) 65 - 99 mg/dL   Comment 1 Capillary Specimen   Glucose, capillary     Status: Abnormal   Collection Time: 04/26/18  3:30 PM  Result Value Ref Range   Glucose-Capillary 141 (H) 65 - 99 mg/dL   Comment 1 Capillary Specimen   Troponin I (q 6hr x 3)     Status: Abnormal   Collection Time: 04/26/18  8:00 PM  Result Value Ref Range   Troponin I 2.37 (HH) <0.03 ng/mL  Comment: CRITICAL VALUE NOTED.  VALUE IS CONSISTENT WITH PREVIOUSLY REPORTED AND CALLED VALUE. Performed at [facility], [place] [address], [place], [place] [address]   Basic metabolic panel     Status: Abnormal   Collection Time: 04/26/18  8:00 PM  Result Value Ref Range   Sodium 129 (L) 135 - 145 mmol/L   Potassium 4.4 3.5 - 5.1 mmol/L   Chloride 99 (L) 101 - 111 mmol/L   CO2 14 (L) 22 - 32 mmol/L   Glucose, Bld 194 (H) 65 - 99 mg/dL   BUN 55 (H) 6 - 20 mg/dL   Creatinine, Ser 7.80 (H) 0.44 - 1.00 mg/dL   Calcium 7.8 (L) 8.9 - 10.3 mg/dL   GFR calc non Af Amer 5 (L) >60 mL/min   GFR calc Af Amer 6 (L) >60 mL/min    Comment: (NOTE) The eGFR has been calculated using the CKD EPI equation. This calculation has not been validated in all clinical situations. eGFR's persistently <60 mL/min signify possible Chronic Kidney Disease.    Anion gap 16 (H) 5 - 15    Comment: Performed at [facility], [place] [address] [place], [place] [address]  Glucose, capillary     Status: Abnormal   Collection Time: 04/26/18  8:08 PM  Result Value Ref Range   Glucose-Capillary 175 (H) 65 - 99 mg/dL   Comment 1 Notify RN   Lactic acid, plasma     Status: None   Collection Time: 04/26/18  9:08  PM  Result Value Ref Range   Lactic Acid, Venous 0.7 0.5 - 1.9 mmol/L    Comment: Performed at [place] [address], [place], [place] [address]  Glucose, capillary     Status: Abnormal   Collection Time: 04/26/18 11:49 PM  Result Value Ref Range   Glucose-Capillary 224 (H) 65 - 99 mg/dL   Comment 1 Notify RN   Glucose, capillary     Status: Abnormal   Collection Time: 04/27/18  3:52 AM  Result Value Ref Range   Glucose-Capillary 155 (H) 65 - 99 mg/dL   Comment 1 Notify RN   Lactic acid, plasma     Status: None   Collection Time: 04/27/18  4:27 AM  Result Value Ref Range   Lactic Acid, Venous 0.8 0.5 - 1.9 mmol/L    Comment: Performed at [facility], [place] [address], [place], [place] [address]  Procalcitonin     Status: None   Collection Time: 04/27/18  4:35 AM  Result Value Ref Range   Procalcitonin 2.36 ng/mL    Comment:        Interpretation: PCT > 2 ng/mL: Systemic infection (sepsis) is likely, unless other causes are known. (NOTE)       Sepsis PCT Algorithm           Lower Respiratory Tract                                      Infection PCT Algorithm    ----------------------------     ----------------------------         PCT < 0.25 ng/mL                PCT < 0.10 ng/mL         Strongly encourage             Strongly discourage  discontinuation of antibiotics    initiation of antibiotics    ----------------------------     -----------------------------       PCT 0.25 - 0.50 ng/mL            PCT 0.10 - 0.25 ng/mL               OR       >80% decrease in PCT            Discourage initiation of                                            antibiotics      Encourage discontinuation           of antibiotics    ----------------------------     -----------------------------         PCT >= 0.50 ng/mL              PCT 0.26 - 0.50 ng/mL               AND       <80% decrease in PCT              Encourage initiation of                                              antibiotics       Encourage continuation           of antibiotics    ----------------------------     -----------------------------        PCT >= 0.50 ng/mL                  PCT > 0.50 ng/mL               AND         increase in PCT                  Strongly encourage                                      initiation of antibiotics    Strongly encourage escalation           of antibiotics                                     -----------------------------                                           PCT <= 0.25 ng/mL                                                 OR                                        >  80% decrease in PCT                                     Discontinue / Do not initiate                                             antibiotics Performed at [facility], [place] [address], [place], [place] [address]   Basic metabolic panel     Status: Abnormal   Collection Time: 04/27/18  4:35 AM  Result Value Ref Range   Sodium 133 (L) 135 - 145 mmol/L   Potassium 3.9 3.5 - 5.1 mmol/L   Chloride 102 101 - 111 mmol/L   CO2 18 (L) 22 - 32 mmol/L   Glucose, Bld 185 (H) 65 - 99 mg/dL   BUN 58 (H) 6 - 20 mg/dL   Creatinine, Ser 7.79 (H) 0.44 - 1.00 mg/dL   Calcium 8.0 (L) 8.9 - 10.3 mg/dL   GFR calc non Af Amer 5 (L) >60 mL/min   GFR calc Af Amer 6 (L) >60 mL/min    Comment: (NOTE) The eGFR has been calculated using the CKD EPI equation. This calculation has not been validated in all clinical situations. eGFR's persistently <60 mL/min signify possible Chronic Kidney Disease.    Anion gap 13 5 - 15    Comment: Performed at [place] [address], [place], [place] [address]  Magnesium     Status: Abnormal   Collection Time: 04/27/18  4:35 AM  Result Value Ref Range   Magnesium 1.4 (L) 1.7 - 2.4 mg/dL    Comment: Performed at [facility] [address] [place], [place] [address]  CBC     Status: Abnormal   Collection Time: 04/27/18  4:35 AM  Result Value  Ref Range   WBC 12.3 (H) 4.0 - 10.5 K/uL   RBC 4.08 3.87 - 5.11 MIL/uL   Hemoglobin 9.9 (L) 12.0 - 15.0 g/dL    Comment: DELTA CHECK NOTED POST TRANSFUSION SPECIMEN    HCT 31.0 (L) 36.0 - 46.0 %   MCV 76.0 (L) 78.0 - 100.0 fL   MCH 24.3 (L) 26.0 - 34.0 pg   MCHC 31.9 30.0 - 36.0 g/dL   RDW 17.6 (H) 11.5 - 15.5 %   Platelets 311 150 - 400 K/uL    Comment: Performed at [facility], [place] [address], [place], [place] [address]  CK     Status: None   Collection Time: 04/27/18  4:35 AM  Result Value Ref Range   Total CK 221 38 - 234 U/L    Comment: Performed at [facility], [place] [address], [place], [place] [address]  Glucose, capillary     Status: Abnormal   Collection Time: 04/27/18  7:58 AM  Result Value Ref Range   Glucose-Capillary 144 (H) 65 - 99 mg/dL   Comment 1 Capillary Specimen    Comment 2 Notify RN   Glucose, capillary     Status: Abnormal   Collection Time: 04/27/18 11:05 AM  Result Value Ref Range   Glucose-Capillary 155 (H) 65 - 99 mg/dL   Comment 1 Capillary Specimen    Comment 2 Notify RN   APTT     Status: Abnormal  Collection Time: 04/27/18  2:23 PM  Result Value Ref Range   aPTT 72 (H) 24 - 36 seconds    Comment:        IF BASELINE aPTT IS ELEVATED, SUGGEST PATIENT RISK ASSESSMENT BE USED TO DETERMINE APPROPRIATE ANTICOAGULANT THERAPY. Performed at [facility], [place] [address], [place], [place] [address]   Heparin level (unfractionated)     Status: Abnormal   Collection Time: 04/27/18  2:23 PM  Result Value Ref Range   Heparin Unfractionated 0.28 (L) 0.30 - 0.70 IU/mL    Comment: (NOTE) If heparin results are below expected values, and patient dosage has  been confirmed, suggest follow up testing of antithrombin III levels. Performed at [facility], [place] [address], [place], [place] [address]   Glucose, capillary     Status: Abnormal   Collection Time: 04/27/18  3:27 PM  Result Value Ref Range   Glucose-Capillary 125 (H)  65 - 99 mg/dL   Comment 1 Capillary Specimen    Comment 2 Notify RN     IMAGING: Dg Chest Port 1 View  Result Date: 04/27/2018 CLINICAL DATA:  Acute respiratory failure EXAM: PORTABLE CHEST 1 VIEW COMPARISON:  04/25/2018 FINDINGS: Bilateral mild interstitial thickening with patchy alveolar airspace opacities at bilateral lung bases. No pleural effusion or pneumothorax. Stable cardiomediastinal silhouette. Left IJ central venous catheter with the tip projecting over the SVC in unchanged position. No acute osseous abnormality. IMPRESSION: 1. Bilateral interstitial thickening concerning for mild interstitial edema versus interstitial infection. Electronically Signed   By: Kathreen Devoid   On: 04/27/2018 08:37    IMPRESSION: Patient's EKG and elevation in troponin is concerning for NSTEMI.  RECOMMENDATION: Patient need full cardiac work-up including cardiac catheterization.  Once hemodynamically stable and improved renal function, should be scheduled as an outpatient. She also has an history of A. fib and was on Xarelto before surgery, which is being held. -We will schedule for echo at this time. -She will need cardioprotective medications which include beta-blocker, ACEI/ARB, high intensity statin and aspirin, these can be started once hemodynamically and renally stable. -Repeat lipid profile.  Lipid profile done a year ago shows elevated total cholesterol, triglyceride and LD L with low HDL.  Patient was on Crestor 20 mg daily at home which can be restarted once CK resolved. -Repeat troponin and CK level tomorrow morning. -Restart Xarelto once cleared from vascular surgery.   Lorella Nimrod MD PGY2 04/27/2018 3:41 PM

## 2018-04-27 NOTE — Progress Notes (Signed)
[place] for Heparin (holding Xarelto) Indication: atrial fibrillation  No Known Allergies  Patient Measurements: Height: 5\' 7"  (170.2 cm) Weight: 182 lb 5.1 oz (82.7 kg) IBW/kg (Calculated) : 61.6  Vital Signs: Temp: 97.5 F (36.4 C) (06/10 1125) Temp Source: Oral (06/10 1125) BP: 87/75 (06/10 1400) Pulse Rate: 76 (06/10 1400)  Labs: Recent Labs    04/25/18 0318  04/25/18 1223  04/25/18 2215 04/26/18 0100 04/26/18 0404 04/26/18 2000 04/27/18 0435 04/27/18 1423  HGB 7.5*  --   --   --   --  7.7* 7.5*  --  9.9*  --   HCT 24.2*  --   --   --   --  24.5* 23.9*  --  31.0*  --   PLT 379  --   --   --   --  417* 407*  --  311  --   APTT  --   --  70*  --  47*  --   --   --   --  72*  HEPARINUNFRC  --   --  1.87*  --   --   --   --   --   --  0.28*  CREATININE 7.54*   < >  --    < >  --   --  7.63* 7.80* 7.79*  --   CKTOTAL 1,049*  --   --   --   --   --  529*  --  221  --   TROPONINI 0.13*   < > 5.95*  --  7.71*  --  6.50* 2.37*  --   --    < > = values in this interval not displayed.    Estimated Creatinine Clearance: 9.2 mL/min (A) (by C-G formula based on SCr of 7.79 mg/dL (H)).   Assessment: 52 y/o F on Xarelto PTA for afib, now with a gangrenous foot leading to sepsis requiring vasopressor support, holding Xarelto and started heparin during critical illness. IV heparin was held this AM for above knee amputation. Discussed with [name] who asked to hold IV heparin today and resume 04/27/18 at 0700. H/H low stable. Plt 407k   PTT = 72 seconds, Heparin level = 0.28  Goal of Therapy:  Heparin level 0.3-0.7 units/ml aPTT 66-102 seconds Monitor platelets by anticoagulation protocol: Yes   Plan:  -Increase heparin slightly to 1150 units / hr to prevent drop -Monitor daily HL, aPTT and s/s of bleeding (can likely stop daily PTT in AM, likely correlating)  Thank you Anette Guarneri, PharmD [phone]

## 2018-04-27 NOTE — Progress Notes (Addendum)
PULMONARY / CRITICAL CARE MEDICINE   Name: [name] MRN: [id number] DOB: 1966-02-07    ADMISSION DATE:  04/24/2018 CONSULTATION DATE:  04/25/2018  REFERRING MD:  Dr. 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 Post-Op patient lost pulse to left foot and was taken back to OR for thrombectomy. On 6/3 patient went to outpatient vascular appointment where she was found to by hypotensive and febrile with a occlusion noted to bypass graft. Patient was told at this time to got to ED however, she was going on a camping trip in which she did not want to miss. Sent home with clindamycin.   On 6/7 patient presented to ED with AMS with pain to left lower leg and emesis and diarrhea. On arrival BP 71/48. LA 1.14. Given 3.5L NS without improvement, started on levophed gtt. Administered Vancomycin and Zosyn. LLE with no pulse and gangrenous. Vascular Consulted. Plans for BKA on Monday. PCCM asked to admit.  Patient states she took Xarelto last on 6/7.   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  SUBJECTIVE:  Lethargic/drowsy, arouses to voice Disoriented to time On 1 mcg levophed Worsening oliguric renal failure Denies pain or SOB   VITAL SIGNS: BP (!) 65/56   Pulse 79   Temp (!) 97.2 F (36.2 C) (Oral)   Resp 15   Ht 5\' 7"  (1.702 m)   Wt 182 lb 5.1 oz (82.7 kg)   SpO2 100%   BMI 28.56 kg/m   HEMODYNAMICS: CVP:  [4 mmHg-9 mmHg] 9 mmHg  VENTILATOR SETTINGS:    INTAKE / OUTPUT: I/O last 3 completed shifts: In: 8021.9 [P.O.:358; I.V.:5928.9; Blood:985; Other:300; IV Piggyback:450] Out: 347 [Urine:247; Blood:100]  PHYSICAL EXAMINATION: General: Acutely ill-appearing female, laying in bed, asleep, on room air, in no acute distress  Neuro: Drowsy,  arouses to voice, follows commands and questions, no focal deficits, right pupil 5 mm and left 3 mm (no change from previous) HEENT: Atraumatic, normocephalic, neck supple, no JVD Cardiovascular: RRR, no M/R/G  Lungs: Clear diminished breath sounds throughout, even, non-labored, no assessory muscle use Abdomen: Soft, non-tender, non-distended, BS+ x4 Musculoskeletal: LLE AKA Skin: Dry.  No obvious rashes, lesions, or ulcerations.  AKA left LLE wrapped in dressing  LABS:  BMET Recent Labs  Lab 04/26/18 0404 04/26/18 2000 04/27/18 0435  NA 134* 129* 133*  K 4.1 4.4 3.9  CL 102 99* 102  CO2 16* 14* 18*  BUN 56* 55* 58*  CREATININE 7.63* 7.80* 7.79*  GLUCOSE 136* 194* 185*    Electrolytes Recent Labs  Lab 04/25/18 0318  04/26/18 0404 04/26/18 2000 04/27/18 0435  CALCIUM 7.4*   < > 7.6* 7.8* 8.0*  MG 1.1*  --  1.1*  --  1.4*  PHOS 7.0*  --  7.9*  --   --    < > = values in this interval not displayed.    CBC Recent Labs  Lab 04/26/18 0100 04/26/18 0404 04/27/18 0435  WBC 22.9* 21.6* 12.3*  HGB 7.7* 7.5* 9.9*  HCT 24.5* 23.9* 31.0*  PLT 417* 407* 311    Coag's Recent Labs  Lab 04/25/18 1223 04/25/18 2215  APTT 70* 47*    Sepsis Markers Recent Labs  Lab 04/25/18 0000  04/25/18 0318 04/26/18 0404 04/26/18 2108 04/27/18 0427 04/27/18 0435  LATICACIDVEN 1.14  --   --  0.7 0.8  --   PROCALCITON  --  1.59 2.88  --   --  2.36    ABG No results for input(s): PHART, PCO2ART, PO2ART in the last 168 hours.  Liver Enzymes Recent Labs  Lab 04/24/18 2345  AST 26  ALT 10*  ALKPHOS 131*  BILITOT 0.6  ALBUMIN 1.8*    Cardiac Enzymes Recent Labs  Lab 04/25/18 2215 04/26/18 0404 04/26/18 2000  TROPONINI 7.71* 6.50* 2.37*    Glucose Recent Labs  Lab 04/26/18 1341 04/26/18 1530 04/26/18 2008 04/26/18 2349 04/27/18 0352 04/27/18 0758  GLUCAP 106* 141* 175* 224* 155* 144*    Imaging Dg Chest Port 1 View  Result Date: 04/27/2018 CLINICAL  DATA:  Acute respiratory failure EXAM: PORTABLE CHEST 1 VIEW COMPARISON:  04/25/2018 FINDINGS: Bilateral mild interstitial thickening with patchy alveolar airspace opacities at bilateral lung bases. No pleural effusion or pneumothorax. Stable cardiomediastinal silhouette. Left IJ central venous catheter with the tip projecting over the SVC in unchanged position. No acute osseous abnormality. IMPRESSION: 1. Bilateral interstitial thickening concerning for mild interstitial edema versus interstitial infection. Electronically Signed   By: Kathreen Devoid   On: 04/27/2018 08:37     STUDIES:  CXR 6/7 > Moderate cardiomegaly, stable. Mediastinal silhouette within normal Limits. No focal infiltrates. No pulmonary edema or visible pleural effusion. No pneumothorax. Osseous structures within normal limits  CULTURES: Blood 6/7 >> U/A 6/7 >>   ANTIBIOTICS: Vancomycin 6/7 >> Zosyn 6/7 >>  SIGNIFICANT EVENTS: 6/7 > Presents to ED  6/9>> Emergent Left AKA per Vascular surgery  LINES/TUBES: PIV   DISCUSSION: 52 year old female presents in Septic Shock secondary to gangrenous left foot.  Subsequent development of oliguric AKI and elevated troponins.  Status post emergent left AKA on 6/9 per vascular surgery.   ASSESSMENT / PLAN:  PULMONARY A:  No active issues At risk for post-op atelectasis/PNA P: Supplemental O2 prn to maintain O2 sats >92% Pulmonary hygiene Incentive spirometry q1h while awake  CARDIAC A: Septic Shock in setting of gangrenous left foot  -CVP 9 on 6/10 Elevated troponin, possible stress related non-ST elevation MI in setting of hypotension H/O A.Fib on Xarelto, PAD  P:  Maintain MAP >65 Levophed to maintain MAP goal, wean as tolerated Follow CVP, volume resuscitation based on CVP Vascular surgery following, appreciate input S/p Left AKA on 6/9 Heparin gtt per pharmacy Continue to follow troponin Will need Cardiology evaluation at some point  RENAL A: Acute  Kidney Injury in setting of septic shock vs Rhabdo; oliguria Non-anion gap metabolic acidosis Mild rhabdomyolysis, CK 1049 Crt 8.12 >> 7.41>>7.79 CK>>1049>>221 LA 1.14  Renal ultrasound 6/8 normal P:   CPK has peaked and trending down back to normal Monitor I&O's / urine output Trend BMP Discussed with Dr. Nelda Marseille, will hold off on consulting Nephrology today 6/10, will follow urine output today and BMP in am 6/11 and reassess need for Nephrology consult 6/11 Discontinue NS @ 100 ml/hr Increase Bicarb gtt to 125 ml/hr Replace electrolytes as indicated Replete Magnesium 2g IV on 6/10, recheck in am 6/11  GI A: GERD Nausea  P:   NPO for now, advance to clear liquids as tolerated Zofran prn Pepcid for SUP  HEMATOLOGIC A: Anemia  Iron deficiency P:  Continue Heparin gtt per pharmacy for anticoagulation (Hx of A-fib, elevated troponin) Monitor for s/sx of bleeding Follow CBC Transfused 2  units pRBC's on 6/9 Initiate iron replacement when taking good P.O. Transfuse for Hgb <7  INFECTIOUS A: Gangrenous Left Leg  -WBC 12.3 (previously 21.6 on 6/9), Afebrile (previously febrile), PCT 2.36 (peaked 2.88)  P:   Continue Broad-spectrum ABX (vancomycin, zosyn) as above Follow cultures S/p Left AKA on 6/9  ENDOCRINE A: DM   -Random cortisol 24 P:   CBG's SSI  FAMILY  - Updates: No family present at bedside 04/27/18.  - Inter-disciplinary family meet or Palliative Care meeting due by: 05/02/2018   Darel Hong, AGACNP-BC 04/27/2018, 10:24 AM [facility] [phone] or if no answer [phone]  Attending Note:  52 year old female with PMH of severe PVD who presents with compromise of the blood supply to the left leg.  Patient presented with septic shock and underwent AKA.  Patient remains on pressors and acidotic with renal failure and troponin of 7.  On exam, she is awake and interactive, clear lungs.  I reviewed CXR myself, no acute disease noted.   Discussed with PCCM-NP.  Will continue abx for now. Continue levophed.  Cardiology consult to be called and if no improvement will proceed with renal consult in AM.  Hold the ICU for septic shock and circulatory collapse.  Hydrate as above.  The patient is critically ill with multiple organ systems failure and requires high complexity decision making for assessment and support, frequent evaluation and titration of therapies, application of advanced monitoring technologies and extensive interpretation of multiple databases.   Critical Care Time devoted to patient care services described in this note is  31  Minutes. This time reflects time of care of this signee Dr [name]. This critical care time does not reflect procedure time, or teaching time or supervisory time of PA/NP/Med student/Med Resident etc but could involve care discussion time.  Rush Farmer, M.D. [facility] Medicine. Pager: [phone]. After hours pager: [phone].

## 2018-04-27 NOTE — Progress Notes (Addendum)
  Progress Note    04/27/2018 8:25 AM 1 Day Post-Op  Subjective:  Patient more alert this morning however still somewhat confused   Vitals:   04/27/18 0800 04/27/18 0812  BP:  (!) 67/58  Pulse: 83 82  Resp: 15 13  Temp:    SpO2: 100% 100%   Physical Exam: Lungs:  Non labored on RA Incisions:  L AKA dressing adjusted; no breakthrough bleeding Extremities:  Dressing adjusted but left in place Abdomen:  Soft Neurologic: A&O  CBC    Component Value Date/Time   WBC 12.3 (H) 04/27/2018 0435   RBC 4.08 04/27/2018 0435   HGB 9.9 (L) 04/27/2018 0435   HCT 31.0 (L) 04/27/2018 0435   PLT 311 04/27/2018 0435   MCV 76.0 (L) 04/27/2018 0435   MCH 24.3 (L) 04/27/2018 0435   MCHC 31.9 04/27/2018 0435   RDW 17.6 (H) 04/27/2018 0435   LYMPHSABS 1.0 04/24/2018 2345   MONOABS 1.4 (H) 04/24/2018 2345   EOSABS 0.0 04/24/2018 2345   BASOSABS 0.0 04/24/2018 2345    BMET    Component Value Date/Time   NA 133 (L) 04/27/2018 0435   K 3.9 04/27/2018 0435   CL 102 04/27/2018 0435   CO2 18 (L) 04/27/2018 0435   GLUCOSE 185 (H) 04/27/2018 0435   BUN 58 (H) 04/27/2018 0435   CREATININE 7.79 (H) 04/27/2018 0435   CREATININE 1.24 (H) 08/12/2017 1530   CALCIUM 8.0 (L) 04/27/2018 0435   GFRNONAA 5 (L) 04/27/2018 0435   GFRNONAA 50 (L) 08/12/2017 1530   GFRAA 6 (L) 04/27/2018 0435   GFRAA 57 (L) 08/12/2017 1530    INR    Component Value Date/Time   INR 1.15 03/24/2018 1440     Intake/Output Summary (Last 24 hours) at 04/27/2018 0825 Last data filed at 04/27/2018 0800 Gross per 24 hour  Intake 5720.74 ml  Output 250 ml  Net 5470.74 ml     Assessment/Plan:  52 y.o. female is s/p L AKA 1 Day Post-Op   WBC count much improved today ACE re-wrapped; dressing change tomorrow Critical care per primary team   DVT prophylaxis:  IV heparin   Dagoberto Ligas, PA-C [facility] [phone] 04/27/2018 8:25 AM  Agree with above. Try to get right brachial aline  out ASAP so we do not have a complication from this. 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

## 2018-04-27 NOTE — Consult Note (Addendum)
[place] Nurse wound consult note Reason for Consult: Consult requested for right buttock and necrotic toes prior to Vascular team involvement.  They have performed amputation surgery and are following for assessment and plan of care of this location. 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  Foam dressing to protect from further injury.  Pt is on a low airloss bed to reduce pressure. Discussed plan of care with patient and family member at the bedside. Please re-consult if further assistance is needed.  Thank-you,  Julien Girt MSN, [place], [place], [place], [place]

## 2018-04-28 ENCOUNTER — Ambulatory Visit: Payer: Medicare Other | Admitting: Physician Assistant

## 2018-04-28 ENCOUNTER — Inpatient Hospital Stay (HOSPITAL_COMMUNITY): Payer: Medicare Other

## 2018-04-28 DIAGNOSIS — J9601 Acute respiratory failure with hypoxia: Secondary | ICD-10-CM

## 2018-04-28 DIAGNOSIS — A419 Sepsis, unspecified organism: Secondary | ICD-10-CM

## 2018-04-28 DIAGNOSIS — I340 Nonrheumatic mitral (valve) insufficiency: Secondary | ICD-10-CM

## 2018-04-28 DIAGNOSIS — J9600 Acute respiratory failure, unspecified whether with hypoxia or hypercapnia: Secondary | ICD-10-CM

## 2018-04-28 DIAGNOSIS — N179 Acute kidney failure, unspecified: Secondary | ICD-10-CM

## 2018-04-28 LAB — BASIC METABOLIC PANEL
ANION GAP: 14 (ref 5–15)
BUN: 62 mg/dL — ABNORMAL HIGH (ref 6–20)
CALCIUM: 7.6 mg/dL — AB (ref 8.9–10.3)
CO2: 21 mmol/L — ABNORMAL LOW (ref 22–32)
Chloride: 97 mmol/L — ABNORMAL LOW (ref 101–111)
Creatinine, Ser: 7.72 mg/dL — ABNORMAL HIGH (ref 0.44–1.00)
GFR calc Af Amer: 6 mL/min — ABNORMAL LOW (ref >60)
GFR, EST NON AFRICAN AMERICAN: 5 mL/min — AB (ref >60)
GLUCOSE: 131 mg/dL — AB (ref 65–99)
POTASSIUM: 3.7 mmol/L (ref 3.5–5.1)
Sodium: 132 mmol/L — ABNORMAL LOW (ref 135–145)

## 2018-04-28 LAB — CBC
HCT: 27.3 % — ABNORMAL LOW (ref 36.0–46.0)
Hemoglobin: 9 g/dL — ABNORMAL LOW (ref 12.0–15.0)
MCH: 24.7 pg — ABNORMAL LOW (ref 26.0–34.0)
MCHC: 33 g/dL (ref 30.0–36.0)
MCV: 75 fL — ABNORMAL LOW (ref 78.0–100.0)
PLATELETS: 284 10*3/uL (ref 150–400)
RBC: 3.64 MIL/uL — ABNORMAL LOW (ref 3.87–5.11)
RDW: 17.7 % — AB (ref 11.5–15.5)
WBC: 11.7 10*3/uL — AB (ref 4.0–10.5)

## 2018-04-28 LAB — GLUCOSE, CAPILLARY
GLUCOSE-CAPILLARY: 103 mg/dL — AB (ref 65–99)
GLUCOSE-CAPILLARY: 109 mg/dL — AB (ref 65–99)
GLUCOSE-CAPILLARY: 99 mg/dL (ref 65–99)
Glucose-Capillary: 142 mg/dL — ABNORMAL HIGH (ref 65–99)
Glucose-Capillary: 145 mg/dL — ABNORMAL HIGH (ref 65–99)
Glucose-Capillary: 114 mg/dL — ABNORMAL HIGH (ref 65–99)

## 2018-04-28 LAB — POCT I-STAT 3, ART BLOOD GAS (G3+)
Acid-Base Excess: 3 mmol/L — ABNORMAL HIGH (ref 0.0–2.0)
Bicarbonate: 27.4 mmol/L (ref 20.0–28.0)
O2 SAT: 100 %
PCO2 ART: 39.1 mmHg (ref 32.0–48.0)
PH ART: 7.452 — AB (ref 7.350–7.450)
PO2 ART: 178 mmHg — AB (ref 83.0–108.0)
Patient temperature: 97.6
TCO2: 29 mmol/L (ref 22–32)

## 2018-04-28 LAB — PROCALCITONIN: PROCALCITONIN: 1.69 ng/mL

## 2018-04-28 LAB — LIPID PANEL
Cholesterol: 97 mg/dL (ref 0–200)
HDL: 20 mg/dL — AB (ref >40)
LDL Cholesterol: 43 mg/dL (ref 0–99)
TRIGLYCERIDES: 169 mg/dL — AB (ref <150)
Total CHOL/HDL Ratio: 4.9 RATIO
VLDL: 34 mg/dL (ref 0–40)

## 2018-04-28 LAB — ECHOCARDIOGRAM COMPLETE
HEIGHTINCHES: 67 in
WEIGHTICAEL: 2917.13 [oz_av]

## 2018-04-28 LAB — MAGNESIUM: MAGNESIUM: 1.5 mg/dL — AB (ref 1.7–2.4)

## 2018-04-28 LAB — CK: Total CK: 78 U/L (ref 38–234)

## 2018-04-28 LAB — TROPONIN I: Troponin I: 1.49 ng/mL (ref <0.03)

## 2018-04-28 LAB — HEPARIN LEVEL (UNFRACTIONATED): Heparin Unfractionated: 0.33 IU/mL (ref 0.30–0.70)

## 2018-04-28 LAB — APTT: APTT: 96 s — AB (ref 24–36)

## 2018-04-28 MED ORDER — ROCURONIUM BROMIDE 50 MG/5ML IV SOLN
50.0000 mg | Freq: Once | INTRAVENOUS | Status: AC
  Administered 2018-04-28: 50 mg via INTRAVENOUS

## 2018-04-28 MED ORDER — ROSUVASTATIN CALCIUM 20 MG PO TABS
20.0000 mg | ORAL_TABLET | Freq: Every day | ORAL | Status: DC
  Administered 2018-04-28 – 2018-05-21 (×23): 20 mg via ORAL
  Filled 2018-04-28 (×23): qty 1

## 2018-04-28 MED ORDER — SODIUM CHLORIDE 0.9% FLUSH: 10.0000 mL | INTRAVENOUS | Status: DC | PRN

## 2018-04-28 MED ORDER — DOCUSATE SODIUM 50 MG/5ML PO LIQD
100.0000 mg | Freq: Two times a day (BID) | ORAL | Status: DC | PRN
  Filled 2018-04-28: qty 10

## 2018-04-28 MED ORDER — FENTANYL CITRATE (PF) 100 MCG/2ML IJ SOLN
INTRAMUSCULAR | Status: AC
  Administered 2018-04-28: 100 ug
  Filled 2018-04-28: qty 2

## 2018-04-28 MED ORDER — HEPARIN SODIUM (PORCINE) 1000 UNIT/ML IJ SOLN
3000.0000 [IU] | Freq: Once | INTRAMUSCULAR | Status: AC
  Administered 2018-04-28: 2400 [IU] via INTRAVENOUS
  Filled 2018-04-28: qty 3

## 2018-04-28 MED ORDER — ASPIRIN 325 MG PO TABS
325.0000 mg | ORAL_TABLET | ORAL | Status: AC
  Administered 2018-04-28: 325 mg via ORAL
  Filled 2018-04-28: qty 1

## 2018-04-28 MED ORDER — CHLORHEXIDINE GLUCONATE 0.12% ORAL RINSE (MEDLINE KIT)
15.0000 mL | Freq: Two times a day (BID) | OROMUCOSAL | Status: DC
  Administered 2018-04-28 – 2018-04-30 (×4): 15 mL via OROMUCOSAL

## 2018-04-28 MED ORDER — CHLORHEXIDINE GLUCONATE 0.12% ORAL RINSE (MEDLINE KIT): 15.0000 mL | Freq: Two times a day (BID) | OROMUCOSAL | Status: DC

## 2018-04-28 MED ORDER — ETOMIDATE 2 MG/ML IV SOLN
20.0000 mg | Freq: Once | INTRAVENOUS | Status: AC
  Administered 2018-04-28: 20 mg via INTRAVENOUS

## 2018-04-28 MED ORDER — MIDAZOLAM HCL 2 MG/2ML IJ SOLN
INTRAMUSCULAR | Status: AC
  Administered 2018-04-28: 2 mg
  Filled 2018-04-28: qty 2

## 2018-04-28 MED ORDER — DEXMEDETOMIDINE HCL IN NACL 400 MCG/100ML IV SOLN
0.0000 ug/kg/h | INTRAVENOUS | Status: AC
  Administered 2018-04-28: 0.4 ug/kg/h via INTRAVENOUS
  Administered 2018-04-29 (×2): 0.3 ug/kg/h via INTRAVENOUS
  Administered 2018-04-30: 0.5 ug/kg/h via INTRAVENOUS
  Administered 2018-05-01: 0.4 ug/kg/h via INTRAVENOUS
  Filled 2018-04-28 (×5): qty 100

## 2018-04-28 MED ORDER — FENTANYL CITRATE (PF) 100 MCG/2ML IJ SOLN
100.0000 ug | INTRAMUSCULAR | Status: DC | PRN
  Administered 2018-04-30 – 2018-05-01 (×3): 100 ug via INTRAVENOUS
  Filled 2018-04-28 (×3): qty 2

## 2018-04-28 MED ORDER — FENTANYL CITRATE (PF) 100 MCG/2ML IJ SOLN: 100.0000 ug | INTRAMUSCULAR | Status: DC | PRN

## 2018-04-28 MED ORDER — ASPIRIN 81 MG PO CHEW
81.0000 mg | CHEWABLE_TABLET | Freq: Every day | ORAL | Status: DC
  Administered 2018-04-29 – 2018-05-22 (×24): 81 mg via ORAL
  Filled 2018-04-28 (×25): qty 1

## 2018-04-28 MED ORDER — ORAL CARE MOUTH RINSE
15.0000 mL | OROMUCOSAL | Status: DC
  Administered 2018-04-28: 15 mL via OROMUCOSAL

## 2018-04-28 MED ORDER — SODIUM CHLORIDE 0.9 % IV SOLN
1.0000 g | INTRAVENOUS | Status: AC
  Administered 2018-04-28 – 2018-05-01 (×4): 1 g via INTRAVENOUS
  Filled 2018-04-28 (×4): qty 1

## 2018-04-28 MED ORDER — CHLORHEXIDINE GLUCONATE CLOTH 2 % EX PADS
6.0000 | MEDICATED_PAD | Freq: Every day | CUTANEOUS | Status: DC
  Administered 2018-04-29: 6 via TOPICAL

## 2018-04-28 MED ORDER — ORAL CARE MOUTH RINSE
15.0000 mL | OROMUCOSAL | Status: DC
  Administered 2018-04-28 – 2018-04-29 (×8): 15 mL via OROMUCOSAL

## 2018-04-28 MED ORDER — MORPHINE SULFATE (PF) 2 MG/ML IV SOLN: 2.0000 mg | INTRAVENOUS | Status: DC | PRN

## 2018-04-28 MED ORDER — MAGNESIUM SULFATE 2 GM/50ML IV SOLN
2.0000 g | Freq: Once | INTRAVENOUS | Status: AC
  Administered 2018-04-28: 2 g via INTRAVENOUS
  Filled 2018-04-28: qty 50

## 2018-04-28 MED ORDER — SODIUM CHLORIDE 0.9% FLUSH
10.0000 mL | Freq: Two times a day (BID) | INTRAVENOUS | Status: DC
  Administered 2018-04-28 – 2018-04-30 (×3): 10 mL
  Administered 2018-04-30: 20 mL
  Administered 2018-05-01: 10 mL
  Administered 2018-05-01: 20 mL
  Administered 2018-05-02 – 2018-05-21 (×18): 10 mL
  Administered 2018-05-21: 3 mL

## 2018-04-28 MED ORDER — PERFLUTREN LIPID MICROSPHERE
1.0000 mL | INTRAVENOUS | Status: AC | PRN
  Filled 2018-04-28: qty 10

## 2018-04-28 NOTE — Progress Notes (Signed)
ANTICOAGULATION CONSULT NOTE   Pharmacy Consult for Heparin  Indication: atrial fibrillation  No Known Allergies  Patient Measurements: Height: 5\' 7"  (170.2 cm) Weight: 182 lb 5.1 oz (82.7 kg) IBW/kg (Calculated) : 61.6  Vital Signs: Temp: 97.6 F (36.4 C) (06/11 0700) Temp Source: Axillary (06/11 0700) BP: 73/59 (06/11 0700) Pulse Rate: 73 (06/11 0700)  Labs: Recent Labs    04/25/18 1223  04/25/18 2215  04/26/18 0100 04/26/18 0404 04/26/18 2000 04/27/18 0435 04/27/18 1423 04/28/18 0500  HGB  --   --   --    < > 7.7* 7.5*  --  9.9*  --   --   HCT  --   --   --   --  24.5* 23.9*  --  31.0*  --   --   PLT  --   --   --   --  417* 407*  --  311  --   --   APTT 70*  --  47*  --   --   --   --   --  72* 96*  HEPARINUNFRC 1.87*  --   --   --   --   --   --   --  0.28* 0.33  CREATININE  --    < >  --   --   --  7.63* 7.80* 7.79*  --  7.72*  CKTOTAL  --   --   --   --   --  529*  --  221  --  78  TROPONINI 5.95*  --  7.71*  --   --  6.50* 2.37*  --   --  1.49*   < > = values in this interval not displayed.      Assessment: 52 y/o F on Xarelto PTA for afib, was transitioned to heparin while inpatient and peri-operatively. 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 Heparin was resumed s/p procedure. Unfortunately, the patient suffered a NSTEMI peri-operatively; therefore, heparin is being continued for dual indications. Appears that the plan is to treat the NSTEMI with medical management for the time being.   Goal of Therapy:  Heparin level 0.3-0.7 units/ml aPTT 66-102 seconds Monitor platelets by anticoagulation protocol: Yes    Plan:  -Continue heparin infusion at 1150 units/hr -Daily HL, CBC -Check confirmatory level this afternoon -F/u plan for resuming oral anticoagulation     Hughes Better, PharmD, BCPS Clinical Pharmacist 04/28/2018 9:08 AM

## 2018-04-28 NOTE — Procedures (Signed)
Intubation Procedure Note [name] [id number] 11-05-1966  Procedure: Intubation Indications: Airway protection and maintenance  Procedure Details Consent: Unable to obtain consent because of emergent medical necessity. Time Out: Verified patient identification, verified procedure, site/side was marked, verified correct patient position, special equipment/implants available, medications/allergies/relevent history reviewed, required imaging and test results available.  Performed  Maximum sterile technique was used including antiseptics, gloves, hand hygiene and mask.  MAC    Evaluation Hemodynamic Status: BP stable throughout; O2 sats: stable throughout Patient's Current Condition: stable Complications: No apparent complications Patient did tolerate procedure well. Chest X-ray ordered to verify placement.  CXR: pending.   [name] 04/28/2018

## 2018-04-28 NOTE — Progress Notes (Addendum)
  Progress Note    04/28/2018 9:13 AM 2 Days Post-Op  Subjective:  Has pain in her stump with movement  Afebrile  Vitals:   04/28/18 0600 04/28/18 0700  BP:  (!) 73/59  Pulse: 71 73  Resp: 13 (!) 6  Temp:  97.6 F (36.4 C)  SpO2: 100% 98%    Physical Exam: Incisions:  Clean and dry with staples in tact; no drainage   CBC    Component Value Date/Time   WBC 12.3 (H) 04/27/2018 0435   RBC 4.08 04/27/2018 0435   HGB 9.9 (L) 04/27/2018 0435   HCT 31.0 (L) 04/27/2018 0435   PLT 311 04/27/2018 0435   MCV 76.0 (L) 04/27/2018 0435   MCH 24.3 (L) 04/27/2018 0435   MCHC 31.9 04/27/2018 0435   RDW 17.6 (H) 04/27/2018 0435   LYMPHSABS 1.0 04/24/2018 2345   MONOABS 1.4 (H) 04/24/2018 2345   EOSABS 0.0 04/24/2018 2345   BASOSABS 0.0 04/24/2018 2345    BMET    Component Value Date/Time   NA 132 (L) 04/28/2018 0500   K 3.7 04/28/2018 0500   CL 97 (L) 04/28/2018 0500   CO2 21 (L) 04/28/2018 0500   GLUCOSE 131 (H) 04/28/2018 0500   BUN 62 (H) 04/28/2018 0500   CREATININE 7.72 (H) 04/28/2018 0500   CREATININE 1.24 (H) 08/12/2017 1530   CALCIUM 7.6 (L) 04/28/2018 0500   GFRNONAA 5 (L) 04/28/2018 0500   GFRNONAA 50 (L) 08/12/2017 1530   GFRAA 6 (L) 04/28/2018 0500   GFRAA 57 (L) 08/12/2017 1530    INR    Component Value Date/Time   INR 1.15 03/24/2018 1440     Intake/Output Summary (Last 24 hours) at 04/28/2018 0913 Last data filed at 04/28/2018 0800 Gross per 24 hour  Intake 3828.6 ml  Output 59 ml  Net 3769.6 ml     Assessment/Plan:  52 y.o. female is s/p left above knee amputation  2 Days Post-Op  -stump looks good and healing nicely  -will order stump sock -Levophed weaned this am -per [name] yesterday, Try to get right brachial aline out ASAP so we do not have a complication from this. 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 in 4 weeks   Leontine Locket,  PA-C [facility] [phone] 04/28/2018 9:13 AM  Agree with above.  Ruta Hinds, MD [facility] [place] Office: [phone] Pager: [phone]

## 2018-04-28 NOTE — Progress Notes (Signed)
Orthopedic Tech Progress Note Patient Details:  [name] Jan 20, 1966 [id number] Called [employer] for brace. Patient ID: [name], female   DOB: 1966-10-13, 52 y.o.   MRN: [id number]   Braulio Bosch 04/28/2018, 11:46 AM

## 2018-04-28 NOTE — Procedures (Signed)
Hemodialysis Catheter Insertion Procedure Note [name] [id number] 1966/09/11  Procedure: Insertion of Hemodialysis Catheter Indications: Hemodialysis  Procedure Details Consent: Risks of procedure as well as the alternatives and risks of each were explained to the (patient/caregiver).  Consent for procedure obtained.  Time Out: Verified patient identification, verified procedure, site/side was marked, verified correct patient position, special equipment/implants available, medications/allergies/relevent history reviewed, required imaging and test results available.  Performed  Maximum sterile technique was used including antiseptics, cap, gloves, gown, hand hygiene, mask and sheet.  Skin prep: Chlorhexidine; local anesthetic administered  A Trialysis HD catheter was placed in the right internal jugular vein to 15 cm using the Seldinger technique. Sutured in place, biopatch applied.  Evaluation Blood flow good Complications: No apparent complications Patient did tolerate procedure well. Chest X-ray ordered to verify placement.  CXR: pending.   Procedure performed under direct supervision of Dr.Yacoub and with ultrasound guidance for real time vessel cannulation.     Darel Hong, AGACNP-BC [facility] 04/28/2018, 4:40 PM

## 2018-04-28 NOTE — Progress Notes (Signed)
DAILY PROGRESS NOTE   Patient Name: [name] Date of Encounter: 04/28/2018  Chief Complaint   No chest pain  Patient Profile   52 yo female with DM2, PAD, PAF and recent re-do left femoral to PTA bypass, presented with hypotension/sepsis and found to have elevated troponin c/w NSTEMI.  Subjective   No chest pain overnight. Troponin trending down. Not on aspirin? On IV heparin. Lipid profile is reasonable. Creatinine is significantly elevated - not on dialysis.  Objective   Vitals:   04/28/18 0400 04/28/18 0500 04/28/18 0600 04/28/18 0700  BP:    (!) 73/59  Pulse: 75 86 71   Resp: 13 (!) 8 13   Temp:    97.6 F (36.4 C)  TempSrc:    Axillary  SpO2: 99% 97% 100%   Weight:  182 lb 5.1 oz (82.7 kg)    Height:        Intake/Output Summary (Last 24 hours) at 04/28/2018 0851 Last data filed at 04/28/2018 0600 Gross per 24 hour  Intake 3707.75 ml  Output 45 ml  Net 3662.75 ml   Filed Weights   04/26/18 0429 04/27/18 0500 04/28/18 0500  Weight: 185 lb 13.6 oz (84.3 kg) 182 lb 5.1 oz (82.7 kg) 182 lb 5.1 oz (82.7 kg)    Physical Exam   General appearance: somnolent, awakens to voice Neck: no carotid bruit, no JVD and thyroid not enlarged, symmetric, no tenderness/mass/nodules Lungs: clear to auscultation bilaterally Heart: regular rate and rhythm Abdomen: soft, non-tender; bowel sounds normal; no masses,  no organomegaly Extremities: poor distal pulses, post-surgical wounds Pulses: weak pulses Skin: pale, warm, dry Neurologic: Grossly normal Psych: Pleasant  Inpatient Medications    Scheduled Meds: . Chlorhexidine Gluconate Cloth  6 each Topical Daily  . insulin aspart  0-15 Units Subcutaneous Q4H  . mouth rinse  15 mL Mouth Rinse BID  . mupirocin ointment  1 application Nasal BID    Continuous Infusions: . sodium chloride 10 mL/hr at 04/28/18 0600  . sodium chloride    . famotidine (PEPCID) IV Stopped (04/27/18 9476)  . heparin 1,150 Units/hr  (04/28/18 0600)  . norepinephrine (LEVOPHED) Adult infusion Stopped (04/28/18 0322)  . piperacillin-tazobactam (ZOSYN)  IV Stopped (04/28/18 0530)  .  sodium bicarbonate (isotonic) infusion in sterile water 125 mL/hr at 04/28/18 0800  . vancomycin 1,000 mg (04/28/18 0557)    PRN Meds: sodium chloride, Place/Maintain arterial line **AND** sodium chloride, morphine injection, nitroGLYCERIN, ondansetron (ZOFRAN) IV   Labs   Results for orders placed or performed during the hospital encounter of 04/24/18 (from the past 48 hour(s))  Type and screen     Status: None   Collection Time: 04/26/18  9:47 AM  Result Value Ref Range   ABO/RH(D) O NEG    Antibody Screen NEG    Sample Expiration 04/29/2018    Unit Number [id number]    Blood Component Type RED CELLS,LR    Unit division 00    Status of Unit ISSUED,FINAL    Transfusion Status OK TO TRANSFUSE    Crossmatch Result      Compatible Performed at [facility] [address], [place], [place] [address]    Unit Number [id number]    Blood Component Type RED CELLS,LR    Unit division 00    Status of Unit ISSUED,FINAL    Transfusion Status OK TO TRANSFUSE    Crossmatch Result Compatible    Unit Number [id number]    Blood Component Type RED  CELLS,LR    Unit division 00    Status of Unit REL FROM [name]    Transfusion Status OK TO TRANSFUSE    Crossmatch Result Compatible    Unit Number [id number]    Blood Component Type RED CELLS,LR    Unit division 00    Status of Unit REL FROM [facility]    Transfusion Status OK TO TRANSFUSE    Crossmatch Result Compatible   Prepare RBC     Status: None   Collection Time: 04/26/18 10:19 AM  Result Value Ref Range   Order Confirmation      ORDER PROCESSED BY BLOOD BANK Performed at [facility], [place] [address], [place], [place] [address]   Glucose, capillary     Status: None   Collection Time: 04/26/18 11:37 AM  Result Value Ref Range   Glucose-Capillary 95 65 - 99 mg/dL   Prepare RBC     Status: None   Collection Time: 04/26/18 12:06 PM  Result Value Ref Range   Order Confirmation      ORDER PROCESSED BY BLOOD BANK Performed at [facility], [place] [address], [place], [place] [address]   Glucose, capillary     Status: Abnormal   Collection Time: 04/26/18  1:41 PM  Result Value Ref Range   Glucose-Capillary 106 (H) 65 - 99 mg/dL   Comment 1 Capillary Specimen   Glucose, capillary     Status: Abnormal   Collection Time: 04/26/18  3:30 PM  Result Value Ref Range   Glucose-Capillary 141 (H) 65 - 99 mg/dL   Comment 1 Capillary Specimen   Troponin I (q 6hr x 3)     Status: Abnormal   Collection Time: 04/26/18  8:00 PM  Result Value Ref Range   Troponin I 2.37 (HH) <0.03 ng/mL    Comment: CRITICAL VALUE NOTED.  VALUE IS CONSISTENT WITH PREVIOUSLY REPORTED AND CALLED VALUE. Performed at [facility], [place] [address] [place], [place] [address]   Basic metabolic panel     Status: Abnormal   Collection Time: 04/26/18  8:00 PM  Result Value Ref Range   Sodium 129 (L) 135 - 145 mmol/L   Potassium 4.4 3.5 - 5.1 mmol/L   Chloride 99 (L) 101 - 111 mmol/L   CO2 14 (L) 22 - 32 mmol/L   Glucose, Bld 194 (H) 65 - 99 mg/dL   BUN 55 (H) 6 - 20 mg/dL   Creatinine, Ser 7.80 (H) 0.44 - 1.00 mg/dL   Calcium 7.8 (L) 8.9 - 10.3 mg/dL   GFR calc non Af Amer 5 (L) >60 mL/min   GFR calc Af Amer 6 (L) >60 mL/min    Comment: (NOTE) The eGFR has been calculated using the CKD EPI equation. This calculation has not been validated in all clinical situations. eGFR's persistently <60 mL/min signify possible Chronic Kidney Disease.    Anion gap 16 (H) 5 - 15    Comment: Performed at [facility], [place] [address], [place], [place] [address]  Glucose, capillary     Status: Abnormal   Collection Time: 04/26/18  8:08 PM  Result Value Ref Range   Glucose-Capillary 175 (H) 65 - 99 mg/dL   Comment 1 Notify RN   Lactic acid, plasma     Status: None   Collection  Time: 04/26/18  9:08 PM  Result Value Ref Range   Lactic Acid, Venous 0.7 0.5 - 1.9 mmol/L    Comment: Performed at [facility]  [facility], [place] [address] [place], [place] [address]  Glucose, capillary     Status: Abnormal   Collection Time: 04/26/18 11:49 PM  Result Value Ref Range   Glucose-Capillary 224 (H) 65 - 99 mg/dL   Comment 1 Notify RN   Glucose, capillary     Status: Abnormal   Collection Time: 04/27/18  3:52 AM  Result Value Ref Range   Glucose-Capillary 155 (H) 65 - 99 mg/dL   Comment 1 Notify RN   Lactic acid, plasma     Status: None   Collection Time: 04/27/18  4:27 AM  Result Value Ref Range   Lactic Acid, Venous 0.8 0.5 - 1.9 mmol/L    Comment: Performed at [facility], [place] [address] [place], [place] [address]  Procalcitonin     Status: None   Collection Time: 04/27/18  4:35 AM  Result Value Ref Range   Procalcitonin 2.36 ng/mL    Comment:        Interpretation: PCT > 2 ng/mL: Systemic infection (sepsis) is likely, unless other causes are known. (NOTE)       Sepsis PCT Algorithm           Lower Respiratory Tract                                      Infection PCT Algorithm    ----------------------------     ----------------------------         PCT < 0.25 ng/mL                PCT < 0.10 ng/mL         Strongly encourage             Strongly discourage   discontinuation of antibiotics    initiation of antibiotics    ----------------------------     -----------------------------       PCT 0.25 - 0.50 ng/mL            PCT 0.10 - 0.25 ng/mL               OR       >80% decrease in PCT            Discourage initiation of                                            antibiotics      Encourage discontinuation           of antibiotics    ----------------------------     -----------------------------         PCT >= 0.50 ng/mL              PCT 0.26 - 0.50 ng/mL               AND       <80% decrease in PCT              Encourage initiation of                                              antibiotics       Encourage continuation  of antibiotics    ----------------------------     -----------------------------        PCT >= 0.50 ng/mL                  PCT > 0.50 ng/mL               AND         increase in PCT                  Strongly encourage                                      initiation of antibiotics    Strongly encourage escalation           of antibiotics                                     -----------------------------                                           PCT <= 0.25 ng/mL                                                 OR                                        > 80% decrease in PCT                                     Discontinue / Do not initiate                                             antibiotics Performed at [facility], [address] [place], [place] [address]   Basic metabolic panel     Status: Abnormal   Collection Time: 04/27/18  4:35 AM  Result Value Ref Range   Sodium 133 (L) 135 - 145 mmol/L   Potassium 3.9 3.5 - 5.1 mmol/L   Chloride 102 101 - 111 mmol/L   CO2 18 (L) 22 - 32 mmol/L   Glucose, Bld 185 (H) 65 - 99 mg/dL   BUN 58 (H) 6 - 20 mg/dL   Creatinine, Ser 7.79 (H) 0.44 - 1.00 mg/dL   Calcium 8.0 (L) 8.9 - 10.3 mg/dL   GFR calc non Af Amer 5 (L) >60 mL/min   GFR calc Af Amer 6 (L) >60 mL/min    Comment: (NOTE) The eGFR has been calculated using the CKD EPI equation. This calculation has not been validated in all clinical situations. eGFR's persistently <60 mL/min signify possible Chronic Kidney Disease.    Anion gap 13 5 - 15    Comment: Performed at [facility] [address], [place], [place] [address]  Magnesium  Status: Abnormal   Collection Time: 04/27/18  4:35 AM  Result Value Ref Range   Magnesium 1.4 (L) 1.7 - 2.4 mg/dL    Comment: Performed at [facility] [address], [place], [place] [address]  CBC     Status: Abnormal   Collection Time: 04/27/18   4:35 AM  Result Value Ref Range   WBC 12.3 (H) 4.0 - 10.5 K/uL   RBC 4.08 3.87 - 5.11 MIL/uL   Hemoglobin 9.9 (L) 12.0 - 15.0 g/dL    Comment: DELTA CHECK NOTED POST TRANSFUSION SPECIMEN    HCT 31.0 (L) 36.0 - 46.0 %   MCV 76.0 (L) 78.0 - 100.0 fL   MCH 24.3 (L) 26.0 - 34.0 pg   MCHC 31.9 30.0 - 36.0 g/dL   RDW 17.6 (H) 11.5 - 15.5 %   Platelets 311 150 - 400 K/uL    Comment: Performed at [facility], [place] [address] [place], [place] [address]  CK     Status: None   Collection Time: 04/27/18  4:35 AM  Result Value Ref Range   Total CK 221 38 - 234 U/L    Comment: Performed at [facility], [place] [address], [place], [place] [address]  Glucose, capillary     Status: Abnormal   Collection Time: 04/27/18  7:58 AM  Result Value Ref Range   Glucose-Capillary 144 (H) 65 - 99 mg/dL   Comment 1 Capillary Specimen    Comment 2 Notify RN   Glucose, capillary     Status: Abnormal   Collection Time: 04/27/18 11:05 AM  Result Value Ref Range   Glucose-Capillary 155 (H) 65 - 99 mg/dL   Comment 1 Capillary Specimen    Comment 2 Notify RN   APTT     Status: Abnormal   Collection Time: 04/27/18  2:23 PM  Result Value Ref Range   aPTT 72 (H) 24 - 36 seconds    Comment:        IF BASELINE aPTT IS ELEVATED, SUGGEST PATIENT RISK ASSESSMENT BE USED TO DETERMINE APPROPRIATE ANTICOAGULANT THERAPY. Performed at [facility], [place] [address], [place], [place] [address]   Heparin level (unfractionated)     Status: Abnormal   Collection Time: 04/27/18  2:23 PM  Result Value Ref Range   Heparin Unfractionated 0.28 (L) 0.30 - 0.70 IU/mL    Comment: (NOTE) If heparin results are below expected values, and patient dosage has  been confirmed, suggest follow up testing of antithrombin III levels. Performed at [facility], [place] [address], [place], [place] [address]   Glucose, capillary     Status: Abnormal   Collection Time: 04/27/18  3:27 PM  Result Value Ref Range    Glucose-Capillary 125 (H) 65 - 99 mg/dL   Comment 1 Capillary Specimen    Comment 2 Notify RN   Glucose, capillary     Status: Abnormal   Collection Time: 04/27/18  4:59 PM  Result Value Ref Range   Glucose-Capillary 125 (H) 65 - 99 mg/dL   Comment 1 Capillary Specimen   Glucose, capillary     Status: Abnormal   Collection Time: 04/27/18  7:22 PM  Result Value Ref Range   Glucose-Capillary 147 (H) 65 - 99 mg/dL   Comment 1 Capillary Specimen    Comment 2 Notify RN   Glucose, capillary     Status: Abnormal   Collection Time: 04/27/18 11:03 PM  Result Value Ref Range  Glucose-Capillary 155 (H) 65 - 99 mg/dL   Comment 1 Capillary Specimen    Comment 2 Notify RN   Glucose, capillary     Status: Abnormal   Collection Time: 04/28/18  3:41 AM  Result Value Ref Range   Glucose-Capillary 142 (H) 65 - 99 mg/dL   Comment 1 Capillary Specimen    Comment 2 Notify RN   Heparin level (unfractionated)     Status: None   Collection Time: 04/28/18  5:00 AM  Result Value Ref Range   Heparin Unfractionated 0.33 0.30 - 0.70 IU/mL    Comment: (NOTE) If heparin results are below expected values, and patient dosage has  been confirmed, suggest follow up testing of antithrombin III levels. Performed at [facility], [place] [address], [place], [place] [address]   APTT     Status: Abnormal   Collection Time: 04/28/18  5:00 AM  Result Value Ref Range   aPTT 96 (H) 24 - 36 seconds    Comment:        IF BASELINE aPTT IS ELEVATED, SUGGEST PATIENT RISK ASSESSMENT BE USED TO DETERMINE APPROPRIATE ANTICOAGULANT THERAPY. Performed at [facility], [place] [address], [place], [place] [address]   Procalcitonin     Status: None   Collection Time: 04/28/18  5:00 AM  Result Value Ref Range   Procalcitonin 1.69 ng/mL    Comment:        Interpretation: PCT > 0.5 ng/mL and <= 2 ng/mL: Systemic infection (sepsis) is possible, but other conditions are known to elevate PCT as well. (NOTE)        Sepsis PCT Algorithm           Lower Respiratory Tract                                      Infection PCT Algorithm    ----------------------------     ----------------------------         PCT < 0.25 ng/mL                PCT < 0.10 ng/mL         Strongly encourage             Strongly discourage   discontinuation of antibiotics    initiation of antibiotics    ----------------------------     -----------------------------       PCT 0.25 - 0.50 ng/mL            PCT 0.10 - 0.25 ng/mL               OR       >80% decrease in PCT            Discourage initiation of                                            antibiotics      Encourage discontinuation           of antibiotics    ----------------------------     -----------------------------         PCT >= 0.50 ng/mL              PCT 0.26 - 0.50 ng/mL  AND       <80% decrease in PCT             Encourage initiation of                                             antibiotics       Encourage continuation           of antibiotics    ----------------------------     -----------------------------        PCT >= 0.50 ng/mL                  PCT > 0.50 ng/mL               AND         increase in PCT                  Strongly encourage                                      initiation of antibiotics    Strongly encourage escalation           of antibiotics                                     -----------------------------                                           PCT <= 0.25 ng/mL                                                 OR                                        > 80% decrease in PCT                                     Discontinue / Do not initiate                                             antibiotics Performed at [facility], [place] [address], [place], [place] [address]   Basic metabolic panel     Status: Abnormal   Collection Time: 04/28/18  5:00 AM  Result Value Ref Range   Sodium 132 (L) 135 - 145 mmol/L   Potassium  3.7 3.5 - 5.1 mmol/L   Chloride 97 (L) 101 - 111 mmol/L   CO2 21 (L) 22 - 32 mmol/L   Glucose, Bld 131 (H) 65 - 99 mg/dL   BUN 62 (H) 6 - 20 mg/dL   Creatinine, Ser 7.72 (H) 0.44 - 1.00 mg/dL  Calcium 7.6 (L) 8.9 - 10.3 mg/dL   GFR calc non Af Amer 5 (L) >60 mL/min   GFR calc Af Amer 6 (L) >60 mL/min    Comment: (NOTE) The eGFR has been calculated using the CKD EPI equation. This calculation has not been validated in all clinical situations. eGFR's persistently <60 mL/min signify possible Chronic Kidney Disease.    Anion gap 14 5 - 15    Comment: Performed at [facility] [address] [place], [place] [address]  Magnesium     Status: Abnormal   Collection Time: 04/28/18  5:00 AM  Result Value Ref Range   Magnesium 1.5 (L) 1.7 - 2.4 mg/dL    Comment: Performed at [facility] [address], [place], [place] [address]  Lipid panel     Status: Abnormal   Collection Time: 04/28/18  5:00 AM  Result Value Ref Range   Cholesterol 97 0 - 200 mg/dL   Triglycerides 169 (H) <150 mg/dL   HDL 20 (L) >40 mg/dL   Total CHOL/HDL Ratio 4.9 RATIO   VLDL 34 0 - 40 mg/dL   LDL Cholesterol 43 0 - 99 mg/dL    Comment:        Total Cholesterol/HDL:CHD Risk Coronary Heart Disease Risk Table                     Men   Women  1/2 Average Risk   3.4   3.3  Average Risk       5.0   4.4  2 X Average Risk   9.6   7.1  3 X Average Risk  23.4   11.0        Use the calculated Patient Ratio above and the CHD Risk Table to determine the patient's CHD Risk.        ATP III CLASSIFICATION (LDL):  <100     mg/dL   Optimal  100-129  mg/dL   Near or Above                    Optimal  130-159  mg/dL   Borderline  160-189  mg/dL   High  >190     mg/dL   Very High Performed at [facility] [address], [place], [place] [address]   Troponin I     Status: Abnormal   Collection Time: 04/28/18  5:00 AM  Result Value Ref Range   Troponin I 1.49 (HH) <0.03 ng/mL    Comment: CRITICAL  VALUE NOTED.  VALUE IS CONSISTENT WITH PREVIOUSLY REPORTED AND CALLED VALUE. Performed at [facility], [place] [address], [place], [place] [address]   CK     Status: None   Collection Time: 04/28/18  5:00 AM  Result Value Ref Range   Total CK 78 38 - 234 U/L    Comment: Performed at [facility], [place] [address], [place], [place] [address]  Glucose, capillary     Status: Abnormal   Collection Time: 04/28/18  7:07 AM  Result Value Ref Range   Glucose-Capillary 145 (H) 65 - 99 mg/dL   Comment 1 Capillary Specimen     ECG   N/A  Telemetry   Sinus rhythm - Personally Reviewed  Radiology    Dg Chest Port 1 View  Result Date: 04/27/2018 CLINICAL DATA:  Acute respiratory failure EXAM: PORTABLE CHEST 1 VIEW COMPARISON:  04/25/2018 FINDINGS: Bilateral mild interstitial thickening with  patchy alveolar airspace opacities at bilateral lung bases. No pleural effusion or pneumothorax. Stable cardiomediastinal silhouette. Left IJ central venous catheter with the tip projecting over the SVC in unchanged position. No acute osseous abnormality. IMPRESSION: 1. Bilateral interstitial thickening concerning for mild interstitial edema versus interstitial infection. Electronically Signed   By: Kathreen Devoid   On: 04/27/2018 08:37    Cardiac Studies   Echo pending  Assessment   1. Principal Problem: 2.   Septic shock ([place]) 3. Active Problems: 4.   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   PAD (peripheral artery disease) ([place]) 6.   Pressure injury of skin 7.   DM2 (diabetes mellitus, type 2) ([place]) 8.   Acute kidney injury ([place]) 9.   Buttock wound, left, subsequent encounter 10.   Paroxysmal atrial fibrillation with rapid ventricular response (HCC) 11.   Lower limb ischemia 12.   Microcytic anemia 13.   Gangrene of lower extremity (HCC) 14.   Plan   1. Recent perioperative NSTEMI - chest pain free. On IV heparin, but no aspirin. Start 325 mg daily today, then  81 mg daily thereafter. Restart home Crestor 20 mg daily. Very somnolent - creatinine elevated at 7.72 with BUN rising in 60's.  Time Spent Directly with Patient:  I have spent a total of 25 minutes with the patient reviewing hospital notes, telemetry, EKGs, labs and examining the patient as well as establishing an assessment and plan that was discussed personally with the patient. > 50% of time was spent in direct patient care.  Length of Stay:  LOS: 3 days   Pixie Casino, MD, [facility], [place] Director of the Advanced Lipid Disorders &  Cardiovascular Risk Reduction Clinic Diplomate of the American Board of Clinical Lipidology Attending Cardiologist  Direct Dial: [phone]  Fax: [phone]  Website:  [email or web address] Hilty 04/28/2018, 8:51 AM

## 2018-04-28 NOTE — Procedures (Signed)
OGT Placement By MD   Placed under direct laryngoscopy and confirmed by auscultation with CXR pending.  Rush Farmer, M.D. [facility] Pulmonary/Critical Care Medicine. Pager: [phone]. After hours pager: [phone].

## 2018-04-28 NOTE — Progress Notes (Signed)
Orthopedic Tech Progress Note Patient Details:  [name] 01/07/1966 [id number] Brace completed by bio-tech. Patient ID: KORTLYN KOLTZ, female   DOB: 02-Jul-1966, 53 y.o.   MRN: [id number]   Braulio Bosch 04/28/2018, 2:52 PM

## 2018-04-28 NOTE — Progress Notes (Signed)
ANTICOAGULATION CONSULT NOTE   Pharmacy Consult for Heparin  Indication: atrial fibrillation  No Known Allergies  Patient Measurements: Height: 5\' 7"  (170.2 cm) Weight: 182 lb 5.1 oz (82.7 kg) IBW/kg (Calculated) : 61.6  Vital Signs: Temp: 97.6 F (36.4 C) (06/11 1100) Temp Source: Oral (06/11 1100) BP: 114/66 (06/11 1550) Pulse Rate: 78 (06/11 1550)  Labs: Recent Labs    04/25/18 2215  04/26/18 0404 04/26/18 2000 04/27/18 0435 04/27/18 1423 04/28/18 0500 04/28/18 1100  HGB  --    < > 7.5*  --  9.9*  --   --  9.0*  HCT  --    < > 23.9*  --  31.0*  --   --  27.3*  PLT  --    < > 407*  --  311  --   --  284  APTT 47*  --   --   --   --  72* 96*  --   HEPARINUNFRC  --   --   --   --   --  0.28* 0.33  --   CREATININE  --   --  7.63* 7.80* 7.79*  --  7.72*  --   CKTOTAL  --   --  529*  --  221  --  78  --   TROPONINI 7.71*  --  6.50* 2.37*  --   --  1.49*  --    < > = values in this interval not displayed.      Assessment: 52 y/o F on Xarelto PTA for afib, was transitioned to heparin while inpatient and peri-operatively. 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 Heparin was resumed s/p procedure and now s/p temp HD cath   Unfortunately, the patient suffered a NSTEMI peri-operatively; therefore, heparin is being continued for dual indications. Appears that the plan is to treat the NSTEMI with medical management for the time being.   Goal of Therapy:  Heparin level 0.3-0.7 units/ml aPTT 66-102 seconds Monitor platelets by anticoagulation protocol: Yes    Plan:  -Resume heparin infusion at 1150 units/hr s/p HD cath placement -Daily HL, CBC -F/u plan for resuming oral anticoagulation    Thank you Anette Guarneri, PharmD [phone] 04/28/2018 5:09 PM

## 2018-04-28 NOTE — Progress Notes (Signed)
  Echocardiogram 2D Echocardiogram has been performed.  [name] [name] 04/28/2018, 2:35 PM

## 2018-04-28 NOTE — Consult Note (Signed)
[name] Admit Date: 04/24/2018 04/28/2018 Rexene Agent Requesting Physician:  Nelda Marseille  Reason for Consult:  AKI HPI:  52 year old female seen for the evaluation of acute kidney injury.  Patient admitted 04/24/2018 with septic shock and ischemic/gangrenous left lower extremity after failed attempt at revascularization in the previous month.  She required above-knee amputation on 6/9, vasopressors for septic shock, and course has been complicated by NSTEMI currently medically managed.  She has baseline normal renal function as recently as a month ago.  Presenting creatinine was 8.1, today it is 7.7 with a BUN of 62, potassium 3.7, bicarbonate 21.  She is anuric with less than 100 mL's of urine output yesterday and very little today thus far.  She remains hypotensive on less vasopressor but on a low-dose of norepinephrine nevertheless.  No contrast exposure.  No inhibitor of renin/angiotensin/aldosterone prior to admission.  Husband denies use of NSAIDs prior to admission.  Weight up 22 pounds from admission  PMH Incudes:  Tobacco user  Type 2 diabetes  Severe PAD  Atrial fibrillation on Xarelto   Creat (mg/dL)  Date Value  08/12/2017 1.24 (H)  05/15/2017 1.29 (H)  05/12/2017 1.46 (H)  03/05/2017 0.65   Creatinine, Ser (mg/dL)  Date Value  04/28/2018 7.72 (H)  04/27/2018 7.79 (H)  04/26/2018 7.80 (H)  04/26/2018 7.63 (H)  04/25/2018 7.65 (H)  04/25/2018 7.41 (H)  04/25/2018 7.54 (H)  04/24/2018 8.12 (H)  03/31/2018 1.07 (H)  03/29/2018 0.95  ] I/Os: I/O last 3 completed shifts: In: 6103.9 [P.O.:358; I.V.:5195.9; IV Piggyback:550] Out: 165 [Urine:165]   ROS NSAIDS: denies IV Contrast no exposure TMP/SMX no exposure Hypotension prsent throughout admission Balance of 12 systems is negative w/ exceptions as above  PMH  Past Medical History:  Diagnosis Date  . Abnormal stress test    a. 02/2017 MV: large region of fixed perfusion defect in basal to mid inf,  mid-dist inflat walls, EF 43%. No ischemia (EF 55-60% by f/u echo).  . Arthritis   . Asthma   . Chronic back pain   . Coronary artery calcification seen on CT scan    a. 11/2017 CT Abd/Pelvis: Multi vessel coronary vascular Ca2+.  . Depression   . Diabetes mellitus   . Diabetic neuropathy ([place])   . Family history of adverse reaction to anesthesia    mother had difficlty waking   . Femoral-popliteal bypass graft occlusion, left ([place]) 12/02/2017  . GERD (gastroesophageal reflux disease)   . History of echocardiogram    a. 03/2017 Echo: EF 55-60%, mild LVH, nl RV fxn.  . Hyperlipidemia   . 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 PAD (peripheral artery disease) ([place])   . Paroxysmal atrial fibrillation with rapid ventricular response ([place]) 12/02/2017   a. CHA2DS2VASc = 3-->Xarelto.  Marland Kitchen Ulcer    Foot   PSH  Past Surgical History:  Procedure Laterality Date  . ABDOMINAL AORTAGRAM  June 15, 2014  . ABDOMINAL AORTAGRAM N/A 06/15/2014   Procedure: ABDOMINAL Maxcine Ham;  Surgeon: Serafina Mitchell, MD;  Location: [facility] CATH LAB;  Service: Cardiovascular;  Laterality: N/A;  . ABDOMINAL AORTAGRAM N/A 11/22/2014   Procedure: ABDOMINAL AORTAGRAM;  Surgeon: Serafina Mitchell, MD;  Location: [facility] CATH LAB;  Service: Cardiovascular;  Laterality: N/A;  . ABDOMINAL AORTOGRAM W/LOWER EXTREMITY N/A 01/07/2017   Procedure: Abdominal Aortogram w/Lower Extremity;  Surgeon: Serafina Mitchell, MD;  Location: [place] CV LAB;  Service: Cardiovascular;  Laterality: N/A;  . ABDOMINAL AORTOGRAM W/LOWER EXTREMITY N/A  10/31/2017   Procedure: ABDOMINAL AORTOGRAM W/LOWER EXTREMITY;  Surgeon: Elam Dutch, MD;  Location: [place] CV LAB;  Service: Cardiovascular;  Laterality: N/A;  . ABDOMINAL AORTOGRAM W/LOWER EXTREMITY N/A 03/24/2018   Procedure: ABDOMINAL AORTOGRAM W/LOWER EXTREMITY;  Surgeon: Serafina Mitchell, MD;  Location: [place] CV LAB;  Service: Cardiovascular;  Laterality: N/A;  . AMPUTATION Left  04/26/2018   Procedure: AMPUTATION ABOVE KNEE;  Surgeon: Elam Dutch, MD;  Location: [place];  Service: Vascular;  Laterality: Left;  . AORTA - BILATERAL FEMORAL ARTERY BYPASS GRAFT N/A 11/28/2017   Procedure: AORTA BIFEMORAL BYPASS USING HEMASHIELD GOLD GRAFT & REIMPLANT IMA;  Surgeon: Serafina Mitchell, MD;  Location[facility] OR;  Service: Vascular;  Laterality: N/A;  . AORTIC ARCH ANGIOGRAPHY N/A 10/31/2017   Procedure: AORTIC ARCH ANGIOGRAPHY;  Surgeon: Elam Dutch, MD;  [place];  Service: Cardiovascular;  Laterality: N/A;  . APPLICATION OF WOUND VAC  11/28/2017   Procedure: APPLICATION OF WOUND VAC;  Surgeon: Serafina Mitchell, MD;  Location: [facility] OR;  Service: Vascular;;  . APPLICATION OF WOUND VAC Left 03/27/2018   Procedure: APPLICATION OF WOUND VAC LEFT GROIN USING PREVENA PLUS;  Surgeon: Serafina Mitchell, MD;  Location: [place];  Service: Vascular;  Laterality: Left;  . ARTERIAL BYPASS SURGERY   07/05/2010   Right Common Femoral to below knee popliteal BPG  . BACK SURGERY     X's  2  . CARDIAC CATHETERIZATION    . CHOLECYSTECTOMY     Gall Bladder  . CYSTECTOMY Right    foot  . CYSTECTOMY Left    wrist  . EMBOLECTOMY Left 11/28/2017   Procedure: Left Lower Extremity Embolectomy, Left Tibial Peroneal Trunk Endarterectomy with Patch Angioplasty; Vein Harvest Small Saphenous Graft Left Lower Leg;  Surgeon: [name], MD;  Location: [place];  Service: Vascular;  Laterality: Left;  . FEMORAL-POPLITEAL BYPASS GRAFT Left 03/27/2018   Procedure: THROMBECTOMY OF LEFT FEMORAL TIBIAL BYPASS;  Surgeon: Serafina Mitchell, MD;  Location: [place];  Service: Vascular;  Laterality: Left;  . FEMORAL-TIBIAL BYPASS GRAFT Left 03/27/2018   Procedure: BYPASS GRAFT FEMORAL-TIBIAL ARTERY LEFT REDO USING CRYOPRESERVED SAPHENOUS VEIN 70cm;  Surgeon: Serafina Mitchell, MD;  Location: [facility] OR;  Service: Vascular;  Laterality: Left;  . INTERCOSTAL NERVE BLOCK  November 2015  .  INTRAOPERATIVE ARTERIOGRAM  11/28/2017   Procedure: INTRA OPERATIVE ARTERIOGRAM OF LEFT LEG;  Surgeon: Serafina Mitchell, MD;  Location: [place];  Service: Vascular;;  . INTRAOPERATIVE ARTERIOGRAM Left 03/27/2018   Procedure: INTRA OPERATIVE ARTERIOGRAM TIMES TWO;  Surgeon: Serafina Mitchell, MD;  Location: [place];  Service: Vascular;  Laterality: Left;  . IR FLUORO GUIDE CV LINE RIGHT  03/20/2017  . IR US GUIDE VASC ACCESS RIGHT  03/20/2017  . IRRIGATION AND DEBRIDEMENT BUTTOCKS Right 09/30/2016   Procedure: DEBRIDEMENT RIGHT  BUTTOCK WOUND;  Surgeon: Georganna Skeans, MD;  Location: [place];  Service: General;  Laterality: Right;  . left foot surgery    . left wrist cyst removal Left   . PERIPHERAL VASCULAR CATHETERIZATION N/A 05/07/2016   Procedure: Abdominal Aortogram;  Surgeon: Serafina Mitchell, MD;  Location: [place] CV LAB;  Service: Cardiovascular;  Laterality: N/A;  . PERIPHERAL VASCULAR CATHETERIZATION N/A 05/07/2016   Procedure: Lower Extremity Angiography;  Surgeon: Serafina Mitchell, MD;  Location: [place] CV LAB;  Service: Cardiovascular;  Laterality: N/A;  . PERIPHERAL VASCULAR CATHETERIZATION N/A 05/07/2016   Procedure: Aortic  Arch Angiography;  Surgeon: Serafina Mitchell, MD;  Location: [place] CV LAB;  Service: Cardiovascular;  Laterality: N/A;  . PERIPHERAL VASCULAR CATHETERIZATION N/A 05/07/2016   Procedure: Upper Extremity Angiography;  Surgeon: Serafina Mitchell, MD;  Location: [place] CV LAB;  Service: Cardiovascular;  Laterality: N/A;  . PERIPHERAL VASCULAR CATHETERIZATION Right 05/07/2016   Procedure: Peripheral Vascular Balloon Angioplasty;  Surgeon: Serafina Mitchell, MD;  Location: [place] CV LAB;  Service: Cardiovascular;  Laterality: Right;  subclavian  . PERIPHERAL VASCULAR CATHETERIZATION Right 05/07/2016   Procedure: Peripheral Vascular Intervention;  Surgeon: Serafina Mitchell, MD;  Location: [place] CV LAB;  Service: Cardiovascular;  Laterality: Right;  External  Iliac   . SKIN GRAFT Right 2012   [name] by Dr. Nils Pyle- Right and Left Ankle  . SPINE SURGERY    . THROMBECTOMY FEMORAL ARTERY  11/28/2017   Procedure: THROMBECTOMY  & REVISION OF BILATERAL FEMORAL TO POPLETEAL ARTERIES;  Surgeon: Serafina Mitchell, MD;  Location: [facility] OR;  Service: Vascular;;  . TONSILLECTOMY     FH  Family History  Problem Relation Age of Onset  . Coronary artery disease Mother   . Peripheral vascular disease Mother   . Heart disease Mother        Before age 69  . Other Mother        Venous insuffiency  . Diabetes Mother   . Hyperlipidemia Mother   . Hypertension Mother   . Varicose Veins Mother   . Heart attack Mother        before age 103  . Heart disease Father   . Diabetes Father   . Diabetes Maternal Grandmother   . Diabetes Paternal Grandmother   . Diabetes Paternal Grandfather   . Diabetes Sister   . Hypertension Sister   . Diabetes Brother   . Hypertension Brother    [name]  reports that she quit smoking about 14 months ago. Her smoking use included cigarettes. She quit after 30.00 years of use. She has never used smokeless tobacco. She reports that she does not drink alcohol or use drugs. Allergies No Known Allergies Home medications Prior to Admission medications   Medication Sig Start Date End Date Taking? Authorizing Provider  albuterol (PROVENTIL) 2 MG tablet Take 2 mg by mouth 3 (three) times daily as needed for shortness of breath.    Yes [provider]  aspirin EC 81 MG tablet Take 81 mg by mouth every evening.   Yes [provider]  cetirizine (ZYRTEC) 10 MG tablet Take 10 mg by mouth daily.    Yes [provider]  clindamycin (CLEOCIN) 300 MG capsule Take 1 capsule (300 mg total) by mouth 3 (three) times daily. 04/20/18  Yes Ulyses Amor, PA-C  doxycycline (VIBRA-TABS) 100 MG tablet Take 1 tablet (100 mg total) by mouth 2 (two) times daily. Patient taking differently: Take 100 mg by mouth daily.  01/26/18  Yes [name],  Lavell Islam, MD  HUMALOG KWIKPEN 100 UNIT/ML KiwkPen Inject 10 Units into the skin See admin instructions. 10 units three times a day before meals per sliding scale 11/11/15  Yes [provider]  HYDROcodone-acetaminophen (NORCO) 10-325 MG tablet Take 1 tablet by mouth 2 (two) times daily as needed for severe pain.  01/07/18  Yes [provider]  LANTUS SOLOSTAR 100 UNIT/ML Solostar Pen Inject 36 Units into the skin 2 (two) times daily.  09/15/14  Yes [provider]  loperamide (IMODIUM A-D) 2 MG  tablet Take 12 mg by mouth daily as needed for diarrhea or loose stools.   Yes [provider]  metoprolol tartrate (LOPRESSOR) 25 MG tablet Take 0.5 tablets (12.5 mg total) by mouth 2 (two) times daily. 01/14/18 04/24/18 Yes End, Harrell Gave, MD  omeprazole (PRILOSEC) 20 MG capsule Take 20 mg by mouth daily.   Yes [provider]  ondansetron (ZOFRAN-ODT) 4 MG disintegrating tablet DISSOLVE 1 TABLET IN MOUTH EVERY 8 HOURS AS NEEDED FOR NAUSEA OR VOMITING 04/02/18  Yes [name], MD  pregabalin (LYRICA) 100 MG capsule Limit 1 tab by mouth twice a day to 3 times a day if tolerated Patient taking differently: Take 100 mg by mouth 2 (two) times daily.  07/18/16  Yes Mohammed Kindle, MD  rivaroxaban (XARELTO) 20 MG TABS tablet Take 1 tablet (20 mg total) by mouth daily with supper. 04/08/18  Yes Ulyses Amor, PA-C  rosuvastatin (CRESTOR) 20 MG tablet Take 1 tablet (20 mg total) by mouth daily at 6 PM. 12/09/17  Yes Ulyses Amor, PA-C    Current Medications Scheduled Meds: . [START ON 04/29/2018] aspirin  81 mg Oral Daily  . Chlorhexidine Gluconate Cloth  6 each Topical Daily  . insulin aspart  0-15 Units Subcutaneous Q4H  . mouth rinse  15 mL Mouth Rinse BID  . mupirocin ointment  1 application Nasal BID  . rosuvastatin  20 mg Oral q1800   Continuous Infusions: . sodium chloride 10 mL/hr at 04/28/18 0600  . sodium chloride    . cefTAZidime  (FORTAZ)  IV    . famotidine (PEPCID) IV Stopped (04/28/18 1038)  . heparin Stopped (04/28/18 1150)  . norepinephrine (LEVOPHED) Adult infusion 1 mcg/min (04/28/18 1235)  .  sodium bicarbonate (isotonic) infusion in sterile water 125 mL/hr at 04/28/18 0800   PRN Meds:.sodium chloride, Place/Maintain arterial line **AND** sodium chloride, morphine injection, nitroGLYCERIN, ondansetron (ZOFRAN) IV  CBC Recent Labs  Lab 04/24/18 2345  04/26/18 0404 04/27/18 0435 04/28/18 1100  WBC 19.6*   < > 21.6* 12.3* 11.7*  NEUTROABS 17.1*  --   --   --   --   HGB 7.3*   < > 7.5* 9.9* 9.0*  HCT 23.4*   < > 23.9* 31.0* 27.3*  MCV 75.7*   < > 74.9* 76.0* 75.0*  PLT 311   < > 407* 311 284   < > = values in this interval not displayed.   Basic Metabolic Panel Recent Labs  Lab 04/25/18 0318 04/25/18 0651 04/25/18 1741 04/26/18 0404 04/26/18 2000 04/27/18 0435 04/28/18 0500  NA 132* 132* 128* 134* 129* 133* 132*  K 4.0 3.9 4.0 4.1 4.4 3.9 3.7  CL 100* 102 98* 102 99* 102 97*  CO2 17* 18* 16* 16* 14* 18* 21*  GLUCOSE 169* 144* 107* 136* 194* 185* 131*  BUN 57* 56* 56* 56* 55* 58* 62*  CREATININE 7.54* 7.41* 7.65* 7.63* 7.80* 7.79* 7.72*  CALCIUM 7.4* 7.3* 7.3* 7.6* 7.8* 8.0* 7.6*  PHOS 7.0*  --   --  7.9*  --   --   --     Physical Exam  Blood pressure 114/63, pulse 74, temperature 97.6 F (36.4 C), temperature source Oral, resp. rate (!) 6, height 5\' 7"  (1.702 m), weight 82.7 kg (182 lb 5.1 oz), SpO2 98 %. GEN: Chronically ill-appearing, in mild distress that she cannot go outside, tearful ENT: NCAT, poor dentition EYES: EOMI CV: RRR, no rub, normal S1 and S2 PULM: CTAB anteriorly ABD:  Soft, nontender SKIN: Left stump bandaged EXT: Right lower extremity without edema   Assessment 68F with anuric AKI related to ATN from septic shock, ischemic/gangrenous LLL s/p L AKA  1. Anuric AKI from ATN, normal baseline SCr 2. 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 Septic shock,  improved but on NE still 4. DM2 5. NSTEMI, cardiology following 6. Hx/o AFib  Plan 1. Very likely to require dialysis given prolonged anuric state 2. CCM will place temporary dialysis catheter 3. will reassess in the morning, likely to have first treatment tomorrow 4. Hopefully can tolerate intermittent hemodialysis and will be able to avoid CR 5. Daily weights, Daily Renal Panel, Strict I/Os, Avoid nephrotoxins (NSAIDs, judicious IV Contrast)      Pearson Grippe MD [phone] pgr 04/28/2018, 1:32 PM

## 2018-04-28 NOTE — Progress Notes (Signed)
Patient decompensating quickly form a mental status standpoint, confused, unable to lay flat.  BUN elevated, needs dialysis inorder to get BUN down and improve her mental status.  Renal is ready to dialyze but patient is not able to lay flat for [place] to place HD catheter.  After much deliberation decision was made to proceed with intubation then placement of HD catheter and start CRRT then wean when more stable.  The patient is critically ill with multiple organ systems failure and requires high complexity decision making for assessment and support, frequent evaluation and titration of therapies, application of advanced monitoring technologies and extensive interpretation of multiple databases.   Critical Care Time devoted to patient care services described in this note is  79  Minutes. This time reflects time of care of this signee Dr [name]. This critical care time does not reflect procedure time, or teaching time or supervisory time of PA/NP/Med student/Med Resident etc but could involve care discussion time.  Rush Farmer, M.D. [facility] Pulmonary/Critical Care Medicine. Pager: [phone]. After hours pager: [phone].

## 2018-04-28 NOTE — Progress Notes (Addendum)
PULMONARY / CRITICAL CARE MEDICINE   Name: [name] MRN: [id number] DOB: [birth year]    ADMISSION DATE:  04/24/2018 CONSULTATION DATE:  04/25/2018  REFERRING MD:  Dr. 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 Post-Op patient lost pulse to left foot and was taken back to OR for thrombectomy. On 6/3 patient went to outpatient vascular appointment where she was found to by hypotensive and febrile with a occlusion noted to bypass graft. Patient was told at this time to got to ED however, she was going on a camping trip in which she did not want to miss. Sent home with clindamycin.   On 6/7 patient presented to ED with AMS with pain to left lower leg and emesis and diarrhea. On arrival BP 71/48. LA 1.14. Given 3.5L NS without improvement, started on levophed gtt. Administered Vancomycin and Zosyn. LLE with no pulse and gangrenous. Vascular Consulted. Plans for BKA on Monday. PCCM asked to admit.  Patient states she took Xarelto last on 6/7.   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  Course has been complicated by AKI and NSTEMI.  SUBJECTIVE:  Awake, alert, having pain at amputation site Levophed is off, BP remains marginal Denies chest pain or SOB On Room air  VITAL SIGNS: BP (!) 67/54   Pulse 72   Temp 97.6 F (36.4 C) (Axillary)   Resp 10   Ht 5\' 7"  (1.702 m)   Wt 182 lb 5.1 oz (82.7 kg)   SpO2 99%   BMI 28.56 kg/m   HEMODYNAMICS: CVP:  [9 mmHg-15 mmHg] 9 mmHg  VENTILATOR SETTINGS:    INTAKE / OUTPUT: I/O last 3 completed shifts: In: 6103.9 [P.O.:358; I.V.:5195.9; IV Piggyback:550] Out: 165 [Urine:165]  PHYSICAL EXAMINATION: General: Acutely ill-appearing female, laying in bed, awake, having pain in left amputation site, on room air   Neuro: Awake and alert, disoriented to time, follows commands, No focal deficits , right pupil 85mm and left 3 mm (no change from previous)  HEENT: Atraumatic, normocephalic, neck supple, no JVD Cardiovascular: RRR, No M/R/G Lungs: Clear throughout upon auscultation, even, non-labored, no assessory muscle use Abdomen: Soft, non-tender, non-distended, BS+ x4Soft Musculoskeletal: LLE AKA Skin: Dry,warm. Left AKA wrapped in dressing   LABS:  BMET Recent Labs  Lab 04/26/18 2000 04/27/18 0435 04/28/18 0500  NA 129* 133* 132*  K 4.4 3.9 3.7  CL 99* 102 97*  CO2 14* 18* 21*  BUN 55* 58* 62*  CREATININE 7.80* 7.79* 7.72*  GLUCOSE 194* 185* 131*    Electrolytes Recent Labs  Lab 04/25/18 0318  04/26/18 0404 04/26/18 2000 04/27/18 0435 04/28/18 0500  CALCIUM 7.4*   < > 7.6* 7.8* 8.0* 7.6*  MG 1.1*  --  1.1*  --  1.4* 1.5*  PHOS 7.0*  --  7.9*  --   --   --    < > = values in this interval not displayed.    CBC Recent Labs  Lab 04/26/18 0100 04/26/18 0404 04/27/18 0435  WBC 22.9* 21.6* 12.3*  HGB 7.7* 7.5* 9.9*  HCT 24.5* 23.9* 31.0*  PLT 417* 407* 311    Coag's Recent Labs  Lab 04/25/18 2215 04/27/18 1423 04/28/18 0500  APTT 47* 72* 96*  Sepsis Markers Recent Labs  Lab 04/25/18 0000  04/26/18 0404 04/26/18 2108 04/27/18 0427 04/27/18 0435 04/28/18 0500  LATICACIDVEN 1.14  --   --  0.7 0.8  --   --   PROCALCITON  --    < > 2.88  --   --  2.36 1.69   < > = values in this interval not displayed.    ABG No results for input(s): PHART, PCO2ART, PO2ART in the last 168 hours.  Liver Enzymes Recent Labs  Lab 04/24/18 2345  AST 26  ALT 10*  ALKPHOS 131*  BILITOT 0.6  ALBUMIN 1.8*    Cardiac Enzymes Recent Labs  Lab 04/26/18 0404 04/26/18 2000 04/28/18 0500  TROPONINI 6.50* 2.37* 1.49*    Glucose Recent Labs  Lab 04/27/18 1527 04/27/18 1659 04/27/18 1922 04/27/18 2303 04/28/18 0341 04/28/18 0707  GLUCAP 125* 125* 147* 155* 142*  145*    Imaging No results found.   STUDIES:  CXR 6/7 > Moderate cardiomegaly, stable. Mediastinal silhouette within normal Limits. No focal infiltrates. No pulmonary edema or visible pleural effusion. No pneumothorax. Osseous structures within normal limits  CULTURES: Blood 6/7 >> UC 6/7 >>negative   ANTIBIOTICS: Vancomycin 6/7 >> Zosyn 6/7 >>  SIGNIFICANT EVENTS: 6/7 > Presents to ED  6/9>> Emergent Left AKA per Vascular surgery 6/11>>Nephrology consulted for worsening oliguric renal failure, and metabolic acidosis  LINES/TUBES: PIV   DISCUSSION: 52 year old female presents in Septic Shock secondary to gangrenous left foot.  Subsequent development of oliguric AKI and elevated troponins.  Status post emergent left AKA on 6/9 per vascular surgery.   ASSESSMENT / PLAN:  PULMONARY A:  No active issues At risk for post-op atelectasis/PNA P: Supplemental O2 prn to maintain O2 sats >92% Pulmonary hygiene Incentive spirometry q1h while awake  CARDIAC A: Septic Shock in setting of gangrenous left foot>>improving -CVP 9 Elevated troponin, possible stress related non-ST elevation MI in setting of hypotension H/O A.Fib on Xarelto, PAD  P:  Maintain MAP >65 Levophed as needed to maintain MAP goal, wean as tolerated Follow CVP S/p Left AKA on 6/9 Cardiology following, appreciate input Per Cardiology, conservative medical management of NSTEMI (ASA ordered, BB & long acting nitrate when BP tolerates), will need cardiac cath at some point Heparin gtt per pharmacy ECHO pending  RENAL A: Acute Kidney Injury in setting of septic shock vs Rhabdo; oliguria Non-anion gap metabolic acidosis>>improving with Bicarb gtt Mild rhabdomyolysis, CK 1049>>resolved Crt 8.12 >> 7.41>>7.79 CK>>1049>>221 LA 1.14  Renal ultrasound 6/8 normal P:   Monitor I&O's / urine output Trend BMP Will consult Nephrology, appreciate input Continue Bicarb gtt @ 125 ml/hr Replace electrolytes as  indicated Replete Mg 2 g IV on 6/11, recheck in am 6/12  GI A: GERD Nausea  P:   Heart healthy diet at tolerated Zofran prn Pepcid for SUP  HEMATOLOGIC A: Anemia  Iron deficiency P:  Continue Heparin gtt per pharmacy for anticoagulation (NSTEMI, hx a-fib) Monitor for s/sx of bleeding Follow CBC Transfuse for Hgb <7 Will initiate iron replacement when taking good PO  INFECTIOUS A: Gangrenous Left Leg  -WBC 12.3 (previously 21.6 on 6/9), Afebrile (previously febrile), PCT 1.69 (peaked 2.88)  P:   Monitor fever curve Trend WBC's Continue Broad-spectrum Abx (vancomycin, zosyn) as above Follow Cultures S/p left AKA on 6/9  ENDOCRINE A: DM   P:   CBG's SSI  FAMILY  - Updates: No family at bedside for update 6/11.  - Inter-disciplinary family meet or Palliative Care  meeting due by: 05/02/2018    Darel Hong, AGACNP-BC [facility] 04/28/2018, 9:51 AM  Attending Note:  52 year old female with AKA with poor vasculature.  Patient is on and off pressors overnight.  Mental status on exam remains a concern but patient is protecting her airway with clear lungs.  I reviewed CXR myself, pulmonary edema noted.  UOP remains very poor.  Renal consult called.  Will likely need HD catheter placement.  NSTEMI noted, cards consulted, recommended conservative management for now.  Will place HD catheter today and begin CRRT.  Hold in the ICU for circulatory shock.  The patient is critically ill with multiple organ systems failure and requires high complexity decision making for assessment and support, frequent evaluation and titration of therapies, application of advanced monitoring technologies and extensive interpretation of multiple databases.   Critical Care Time devoted to patient care services described in this note is  32  Minutes. This time reflects time of care of this signee Dr [name]. This critical care time does not reflect procedure  time, or teaching time or supervisory time of PA/NP/Med student/Med Resident etc but could involve care discussion time.  Rush Farmer, M.D. [facility] Pulmonary/Critical Care Medicine. Pager: [phone]. After hours pager: [phone].

## 2018-04-29 ENCOUNTER — Inpatient Hospital Stay (HOSPITAL_COMMUNITY): Payer: Medicare Other

## 2018-04-29 DIAGNOSIS — I2109 ST elevation (STEMI) myocardial infarction involving other coronary artery of anterior wall: Secondary | ICD-10-CM

## 2018-04-29 DIAGNOSIS — I5043 Acute on chronic combined systolic (congestive) and diastolic (congestive) heart failure: Secondary | ICD-10-CM

## 2018-04-29 DIAGNOSIS — I255 Ischemic cardiomyopathy: Secondary | ICD-10-CM

## 2018-04-29 DIAGNOSIS — G9340 Encephalopathy, unspecified: Secondary | ICD-10-CM

## 2018-04-29 LAB — CULTURE, BLOOD (ROUTINE X 2): Culture: NO GROWTH

## 2018-04-29 LAB — CBC
HCT: 28.6 % — ABNORMAL LOW (ref 36.0–46.0)
Hemoglobin: 9.5 g/dL — ABNORMAL LOW (ref 12.0–15.0)
MCH: 24.2 pg — ABNORMAL LOW (ref 26.0–34.0)
MCHC: 33.2 g/dL (ref 30.0–36.0)
MCV: 73 fL — AB (ref 78.0–100.0)
PLATELETS: 267 10*3/uL (ref 150–400)
RBC: 3.92 MIL/uL (ref 3.87–5.11)
RDW: 17.3 % — AB (ref 11.5–15.5)
WBC: 8.6 10*3/uL (ref 4.0–10.5)

## 2018-04-29 LAB — POCT I-STAT 3, ART BLOOD GAS (G3+)
ACID-BASE EXCESS: 3 mmol/L — AB (ref 0.0–2.0)
BICARBONATE: 26.7 mmol/L (ref 20.0–28.0)
O2 Saturation: 99 %
PO2 ART: 149 mmHg — AB (ref 83.0–108.0)
TCO2: 28 mmol/L (ref 22–32)
pCO2 arterial: 37.8 mmHg (ref 32.0–48.0)
pH, Arterial: 7.454 — ABNORMAL HIGH (ref 7.350–7.450)

## 2018-04-29 LAB — GLUCOSE, CAPILLARY
GLUCOSE-CAPILLARY: 105 mg/dL — AB (ref 65–99)
GLUCOSE-CAPILLARY: 96 mg/dL (ref 65–99)
GLUCOSE-CAPILLARY: 77 mg/dL (ref 65–99)
Glucose-Capillary: 93 mg/dL (ref 65–99)
Glucose-Capillary: 79 mg/dL (ref 65–99)
Glucose-Capillary: 96 mg/dL (ref 65–99)

## 2018-04-29 LAB — BASIC METABOLIC PANEL
ANION GAP: 14 (ref 5–15)
BUN: 68 mg/dL — ABNORMAL HIGH (ref 6–20)
CALCIUM: 7 mg/dL — AB (ref 8.9–10.3)
CO2: 26 mmol/L (ref 22–32)
Chloride: 93 mmol/L — ABNORMAL LOW (ref 101–111)
Creatinine, Ser: 7.61 mg/dL — ABNORMAL HIGH (ref 0.44–1.00)
GFR calc Af Amer: 6 mL/min — ABNORMAL LOW (ref >60)
GFR, EST NON AFRICAN AMERICAN: 5 mL/min — AB (ref >60)
GLUCOSE: 103 mg/dL — AB (ref 65–99)
Potassium: 3.2 mmol/L — ABNORMAL LOW (ref 3.5–5.1)
Sodium: 133 mmol/L — ABNORMAL LOW (ref 135–145)

## 2018-04-29 LAB — HEPARIN LEVEL (UNFRACTIONATED)
HEPARIN UNFRACTIONATED: 0.17 [IU]/mL — AB (ref 0.30–0.70)
HEPARIN UNFRACTIONATED: 0.93 [IU]/mL — AB (ref 0.30–0.70)
Heparin Unfractionated: 0.28 IU/mL — ABNORMAL LOW (ref 0.30–0.70)

## 2018-04-29 LAB — PROCALCITONIN: PROCALCITONIN: 1.1 ng/mL

## 2018-04-29 LAB — VANCOMYCIN, RANDOM: VANCOMYCIN RM: 34

## 2018-04-29 LAB — MAGNESIUM: Magnesium: 1.7 mg/dL (ref 1.7–2.4)

## 2018-04-29 MED ORDER — CHLORHEXIDINE GLUCONATE CLOTH 2 % EX PADS
6.0000 | MEDICATED_PAD | Freq: Every day | CUTANEOUS | Status: DC
  Administered 2018-04-29: 6 via TOPICAL

## 2018-04-29 MED ORDER — ORAL CARE MOUTH RINSE
15.0000 mL | Freq: Four times a day (QID) | OROMUCOSAL | Status: DC
  Administered 2018-04-30 (×5): 15 mL via OROMUCOSAL

## 2018-04-29 MED ORDER — SODIUM CHLORIDE 0.9 % IV SOLN: 100.0000 mL | INTRAVENOUS | Status: DC | PRN

## 2018-04-29 MED ORDER — ALTEPLASE 2 MG IJ SOLR
2.0000 mg | Freq: Once | INTRAMUSCULAR | Status: DC | PRN
  Filled 2018-04-29: qty 2

## 2018-04-29 MED ORDER — HEPARIN SODIUM (PORCINE) 1000 UNIT/ML DIALYSIS
1000.0000 [IU] | INTRAMUSCULAR | Status: DC | PRN
  Filled 2018-04-29: qty 1

## 2018-04-29 NOTE — Progress Notes (Addendum)
ANTICOAGULATION + ANTIMICROBIAL CONSULT NOTE   Pharmacy Consult for Heparin / vancomycin and zosyn Indication: atrial fibrillation / gangrene leg s/p AKA  Patient Measurements: Height: 5\' 7"  (170.2 cm) Weight: 200 lb 13.4 oz (91.1 kg) IBW/kg (Calculated) : 61.6  Vital Signs: Temp: 98.4 F (36.9 C) (06/12 1132) Temp Source: Oral (06/12 1132) BP: 123/65 (06/12 1128) Pulse Rate: 70 (06/12 1128)  Labs: Recent Labs    04/26/18 2000  04/27/18 0435  04/27/18 1423 04/28/18 0500 04/28/18 1100 04/29/18 0320 04/29/18 1135  HGB  --    < > 9.9*  --   --   --  9.0* 9.5*  --   HCT  --   --  31.0*  --   --   --  27.3* 28.6*  --   PLT  --   --  311  --   --   --  284 267  --   APTT  --   --   --   --  72* 96*  --   --   --   HEPARINUNFRC  --   --   --    < > 0.28* 0.33  --  0.17* 0.28*  CREATININE 7.80*  --  7.79*  --   --  7.72*  --  7.61*  --   CKTOTAL  --   --  221  --   --  78  --   --   --   TROPONINI 2.37*  --   --   --   --  1.49*  --   --   --    < > = values in this interval not displayed.      Assessment: 52 y/o F on Xarelto PTA for afib, was transitioned to heparin while inpatient and peri-operatively. 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 Heparin was resumed s/p procedure. Unfortunately, the patient suffered a NSTEMI peri-operatively; therefore, heparin is being continued for dual indications. Appears that the plan is to treat the NSTEMI with medical management for the time being. Heparin level drawn this morning returned subtherapeutic. Pt now on iHD.  Patient is on day #6 of antibiotics - currently on ceftazidime and vancomycin. She is s/p L above knee amputation. All cultures are ngtd. Her kidneys took a hit during this illness, she is in acute renal failure, now requiring intermittent HD. Vancomycin random level was drawn today, returned supratherapeutic at 34. Patient is expected to be dialyzed today. If Ms. Rasberry tolerates a full HD session at the maximum BFR, it is expected that  her vancomycin level will fall to 19-20 mcg/ml (therapeutic).   Goal of Therapy:  Heparin level 0.3-0.7 units/ml aPTT 66-102 seconds Monitor platelets by anticoagulation protocol: Yes    Plan:  -Increase heparin infusion to 1450 units/hr -Daily HL, CBC -Check confirmatory level this evening -F/u plan for resuming oral anticoagulation  -Continue ceftazidime 1 g IV q24h -Continue to dose vancomycin as needed with HD -Monitor HD schedule/tolerance, cultures -Anticipated antibiotic end date of 6/14   Hughes Better, PharmD, BCPS Clinical Pharmacist 04/29/2018 1:09 PM

## 2018-04-29 NOTE — Progress Notes (Signed)
[facility]  Subjective  - sedated on vent   Objective 123/65 70 98.4 F (36.9 C) (Oral) 18 100%  Intake/Output Summary (Last 24 hours) at 04/29/2018 1315 Last data filed at 04/29/2018 1200 Gross per 24 hour  Intake 3731.42 ml  Output 560 ml  Net 3171.42 ml   Right hand pink warm Left aka incision clean healing  Assessment/Planning: Renal failure requiring HD Left AKA healing Off pressors.  Would try to get brachial aline out as soon as practical to avoid complications of this Available to place tunneled dialysis catheter or permanent access as needed.  Ruta Hinds 04/29/2018 1:15 PM --  Laboratory Lab Results: Recent Labs    04/28/18 1100 04/29/18 0320  WBC 11.7* 8.6  HGB 9.0* 9.5*  HCT 27.3* 28.6*  PLT 284 267   BMET Recent Labs    04/28/18 0500 04/29/18 0320  NA 132* 133*  K 3.7 3.2*  CL 97* 93*  CO2 21* 26  GLUCOSE 131* 103*  BUN 62* 68*  CREATININE 7.72* 7.61*  CALCIUM 7.6* 7.0*    COAG Lab Results  Component Value Date   INR 1.15 03/24/2018   INR 1.40 11/28/2017   INR 1.30 11/28/2017   No results found for: PTT

## 2018-04-29 NOTE — Progress Notes (Signed)
Husband [name] called back & gave consent for Dialysis. Dialysis RN here ready to start.

## 2018-04-29 NOTE — Progress Notes (Signed)
DAILY PROGRESS NOTE   Patient Name: [name] Date of Encounter: 04/29/2018  Chief Complaint   Intubated, sedated  Patient Profile   52 yo female with DM2, PAD, PAF and recent re-do left femoral to PTA bypass, presented with hypotension/sepsis and found to have elevated troponin c/w NSTEMI.  Subjective   Events noted yesterday. Now intubated for encephalopathy and rising BUN - dialysis per neprology. Echo demonstrated new ischemic cardiomyopathy with LVEF 30-35% and regional wall motion abnormalities consistent with troponin elevation - NSTEMI, probably a large LAD territory infarct.  Objective   Vitals:   04/29/18 0724 04/29/18 0730 04/29/18 0742 04/29/18 0800  BP:   118/65 113/63  Pulse:  69 70 67  Resp:  18  18  Temp: 98.3 F (36.8 C)     TempSrc: Oral     SpO2:  100% 100% 100%  Weight:      Height:        Intake/Output Summary (Last 24 hours) at 04/29/2018 0859 Last data filed at 04/29/2018 0800 Gross per 24 hour  Intake 3821.72 ml  Output 520 ml  Net 3301.72 ml   Filed Weights   04/27/18 0500 04/28/18 0500 04/29/18 0300  Weight: 182 lb 5.1 oz (82.7 kg) 182 lb 5.1 oz (82.7 kg) 200 lb 13.4 oz (91.1 kg)    Physical Exam   General appearance: intubated, sedated on vent Neck: no carotid bruit, no JVD and thyroid not enlarged, symmetric, no tenderness/mass/nodules Lungs: clear to auscultation bilaterally Heart: regular rate and rhythm Abdomen: soft, non-tender; bowel sounds normal; no masses,  no organomegaly Extremities: poor distal pulses, post-surgical wounds Pulses: weak pulses Skin: pale, warm, dry Neurologic: Mental status: intubated, sedated on vent psych: Cannot assess  Inpatient Medications    Scheduled Meds: . aspirin  81 mg Oral Daily  . chlorhexidine gluconate (MEDLINE KIT)  15 mL Mouth Rinse BID  . Chlorhexidine Gluconate Cloth  6 each Topical Daily  . Chlorhexidine Gluconate Cloth  6 each Topical Q0600  . insulin aspart  0-15 Units  Subcutaneous Q4H  . mouth rinse  15 mL Mouth Rinse 10 times per day  . mupirocin ointment  1 application Nasal BID  . rosuvastatin  20 mg Oral q1800  . sodium chloride flush  10-40 mL Intracatheter Q12H    Continuous Infusions: . sodium chloride 10 mL/hr at 04/29/18 0400  . sodium chloride    . cefTAZidime (FORTAZ)  IV Stopped (04/28/18 1412)  . dexmedetomidine (PRECEDEX) IV infusion 0.3 mcg/kg/hr (04/29/18 0400)  . famotidine (PEPCID) IV Stopped (04/28/18 1038)  . heparin 1,300 Units/hr (04/29/18 0844)  . norepinephrine (LEVOPHED) Adult infusion Stopped (04/28/18 1858)  .  sodium bicarbonate (isotonic) infusion in sterile water 125 mL/hr at 04/29/18 0846    PRN Meds: sodium chloride, Place/Maintain arterial line **AND** sodium chloride, docusate, fentaNYL (SUBLIMAZE) injection, fentaNYL (SUBLIMAZE) injection, morphine injection, nitroGLYCERIN, ondansetron (ZOFRAN) IV, sodium chloride flush   Labs   Results for orders placed or performed during the hospital encounter of 04/24/18 (from the past 48 hour(s))  Glucose, capillary     Status: Abnormal   Collection Time: 04/27/18 11:05 AM  Result Value Ref Range   Glucose-Capillary 155 (H) 65 - 99 mg/dL   Comment 1 Capillary Specimen    Comment 2 Notify RN   APTT     Status: Abnormal   Collection Time: 04/27/18  2:23 PM  Result Value Ref Range   aPTT 72 (H) 24 - 36 seconds    Comment:  IF BASELINE aPTT IS ELEVATED, SUGGEST PATIENT RISK ASSESSMENT BE USED TO DETERMINE APPROPRIATE ANTICOAGULANT THERAPY. Performed at [facility], [place] [address] [place], [place] [address]   Heparin level (unfractionated)     Status: Abnormal   Collection Time: 04/27/18  2:23 PM  Result Value Ref Range   Heparin Unfractionated 0.28 (L) 0.30 - 0.70 IU/mL    Comment: (NOTE) If heparin results are below expected values, and patient dosage has  been confirmed, suggest follow up testing of antithrombin III levels. Performed at [facility], [place] [address], [place], [place] [address]   Glucose, capillary     Status: Abnormal   Collection Time: 04/27/18  3:27 PM  Result Value Ref Range   Glucose-Capillary 125 (H) 65 - 99 mg/dL   Comment 1 Capillary Specimen    Comment 2 Notify RN   Glucose, capillary     Status: Abnormal   Collection Time: 04/27/18  4:59 PM  Result Value Ref Range   Glucose-Capillary 125 (H) 65 - 99 mg/dL   Comment 1 Capillary Specimen   Glucose, capillary     Status: Abnormal   Collection Time: 04/27/18  7:22 PM  Result Value Ref Range   Glucose-Capillary 147 (H) 65 - 99 mg/dL   Comment 1 Capillary Specimen    Comment 2 Notify RN   Glucose, capillary     Status: Abnormal   Collection Time: 04/27/18 11:03 PM  Result Value Ref Range   Glucose-Capillary 155 (H) 65 - 99 mg/dL   Comment 1 Capillary Specimen    Comment 2 Notify RN   Glucose, capillary     Status: Abnormal   Collection Time: 04/28/18  3:41 AM  Result Value Ref Range   Glucose-Capillary 142 (H) 65 - 99 mg/dL   Comment 1 Capillary Specimen    Comment 2 Notify RN   Heparin level (unfractionated)     Status: None   Collection Time: 04/28/18  5:00 AM  Result Value Ref Range   Heparin Unfractionated 0.33 0.30 - 0.70 IU/mL    Comment: (NOTE) If heparin results are below expected values, and patient dosage has  been confirmed, suggest follow up testing of antithrombin III levels. Performed at [facility], [place] [address], [place], [place] [address]   APTT     Status: Abnormal   Collection Time: 04/28/18  5:00 AM  Result Value Ref Range   aPTT 96 (H) 24 - 36 seconds    Comment:        IF BASELINE aPTT IS ELEVATED, SUGGEST PATIENT RISK ASSESSMENT BE USED TO DETERMINE APPROPRIATE ANTICOAGULANT THERAPY. Performed at [facility], [place] [address], [place], [place] [address]   Procalcitonin     Status: None   Collection Time: 04/28/18  5:00 AM  Result Value Ref Range   Procalcitonin 1.69 ng/mL    Comment:          Interpretation: PCT > 0.5 ng/mL and <= 2 ng/mL: Systemic infection (sepsis) is possible, but other conditions are known to elevate PCT as well. (NOTE)       Sepsis PCT Algorithm           Lower Respiratory Tract                                      Infection PCT Algorithm    ----------------------------     ----------------------------  PCT < 0.25 ng/mL                PCT < 0.10 ng/mL         Strongly encourage             Strongly discourage   discontinuation of antibiotics    initiation of antibiotics    ----------------------------     -----------------------------       PCT 0.25 - 0.50 ng/mL            PCT 0.10 - 0.25 ng/mL               OR       >80% decrease in PCT            Discourage initiation of                                            antibiotics      Encourage discontinuation           of antibiotics    ----------------------------     -----------------------------         PCT >= 0.50 ng/mL              PCT 0.26 - 0.50 ng/mL                AND       <80% decrease in PCT             Encourage initiation of                                             antibiotics       Encourage continuation           of antibiotics    ----------------------------     -----------------------------        PCT >= 0.50 ng/mL                  PCT > 0.50 ng/mL               AND         increase in PCT                  Strongly encourage                                      initiation of antibiotics    Strongly encourage escalation           of antibiotics                                     -----------------------------                                           PCT <= 0.25 ng/mL  OR                                        > 80% decrease in PCT                                     Discontinue / Do not initiate                                             antibiotics Performed at [facility], [place] [address], [place],  [place] [address]   Basic metabolic panel     Status: Abnormal   Collection Time: 04/28/18  5:00 AM  Result Value Ref Range   Sodium 132 (L) 135 - 145 mmol/L   Potassium 3.7 3.5 - 5.1 mmol/L   Chloride 97 (L) 101 - 111 mmol/L   CO2 21 (L) 22 - 32 mmol/L   Glucose, Bld 131 (H) 65 - 99 mg/dL   BUN 62 (H) 6 - 20 mg/dL   Creatinine, Ser 7.72 (H) 0.44 - 1.00 mg/dL   Calcium 7.6 (L) 8.9 - 10.3 mg/dL   GFR calc non Af Amer 5 (L) >60 mL/min   GFR calc Af Amer 6 (L) >60 mL/min    Comment: (NOTE) The eGFR has been calculated using the CKD EPI equation. This calculation has not been validated in all clinical situations. eGFR's persistently <60 mL/min signify possible Chronic Kidney Disease.    Anion gap 14 5 - 15    Comment: Performed at [facility] [address] [place], [place] [address]  Magnesium     Status: Abnormal   Collection Time: 04/28/18  5:00 AM  Result Value Ref Range   Magnesium 1.5 (L) 1.7 - 2.4 mg/dL    Comment: Performed at [facility] [address], [place],  [address]  Lipid panel     Status: Abnormal   Collection Time: 04/28/18  5:00 AM  Result Value Ref Range   Cholesterol 97 0 - 200 mg/dL   Triglycerides 169 (H) <150 mg/dL   HDL 20 (L) >40 mg/dL   Total CHOL/HDL Ratio 4.9 RATIO   VLDL 34 0 - 40 mg/dL   LDL Cholesterol 43 0 - 99 mg/dL    Comment:        Total Cholesterol/HDL:CHD Risk Coronary Heart Disease Risk Table                     Men   Women  1/2 Average Risk   3.4   3.3  Average Risk       5.0   4.4  2 X Average Risk   9.6   7.1  3 X Average Risk  23.4   11.0        Use the calculated Patient Ratio above and the CHD Risk Table to determine the patient's CHD Risk.        ATP III CLASSIFICATION (LDL):  <100     mg/dL   Optimal  100-129  mg/dL   Near or Above                    Optimal  130-159  mg/dL   Borderline  160-189  mg/dL   High  >190     mg/dL   Very High Performed at [facility] [address],  [place], [place] [address]   Troponin I     Status: Abnormal   Collection Time: 04/28/18  5:00 AM  Result Value Ref Range   Troponin I 1.49 (HH) <0.03 ng/mL    Comment: CRITICAL VALUE NOTED.  VALUE IS CONSISTENT WITH PREVIOUSLY REPORTED AND CALLED VALUE. Performed at [facility], [place] [address] [place], [place] [address]   CK     Status: None   Collection Time: 04/28/18  5:00 AM  Result Value Ref Range   Total CK 78 38 - 234 U/L    Comment: Performed at [facility], [place] [address], [place], [place] [address]  Glucose, capillary     Status: Abnormal   Collection Time: 04/28/18  7:07 AM  Result Value Ref Range   Glucose-Capillary 145 (H) 65 - 99 mg/dL   Comment 1 Capillary Specimen   CBC     Status: Abnormal   Collection Time: 04/28/18 11:00 AM  Result Value Ref Range   WBC 11.7 (H) 4.0 - 10.5 K/uL   RBC 3.64 (L) 3.87 - 5.11 MIL/uL   Hemoglobin 9.0 (L) 12.0 - 15.0 g/dL   HCT 27.3 (L) 36.0 - 46.0 %   MCV 75.0 (L) 78.0 - 100.0 fL   MCH 24.7 (L) 26.0 - 34.0 pg   MCHC 33.0 30.0 - 36.0 g/dL   RDW 17.7 (H) 11.5 - 15.5 %   Platelets 284 150 - 400 K/uL    Comment: Performed at [facility], [place] [address] [place], [place] [address]  Glucose, capillary     Status: Abnormal   Collection Time: 04/28/18 11:07 AM  Result Value Ref Range   Glucose-Capillary 114 (H) 65 - 99 mg/dL   Comment 1 Capillary Specimen   Glucose, capillary     Status: Abnormal   Collection Time: 04/28/18  3:48 PM  Result Value Ref Range   Glucose-Capillary 103 (H) 65 - 99 mg/dL   Comment 1 Capillary Specimen   I-STAT 3, arterial blood gas (G3+)     Status: Abnormal   Collection Time: 04/28/18  5:56 PM  Result Value Ref Range   pH, Arterial 7.452 (H) 7.350 - 7.450   pCO2 arterial 39.1 32.0 - 48.0 mmHg   pO2, Arterial 178.0 (H) 83.0 - 108.0 mmHg   Bicarbonate 27.4 20.0 - 28.0 mmol/L   TCO2 29 22 - 32 mmol/L   O2 Saturation 100.0 %   Acid-Base Excess 3.0 (H) 0.0 - 2.0 mmol/L   Patient  temperature 97.6 F    Collection site ARTERIAL LINE    Drawn by RT    Sample type ARTERIAL   Glucose, capillary     Status: Abnormal   Collection Time: 04/28/18  7:21 PM  Result Value Ref Range   Glucose-Capillary 109 (H) 65 - 99 mg/dL   Comment 1 Notify RN   Glucose, capillary     Status: None   Collection Time: 04/28/18 11:43 PM  Result Value Ref Range   Glucose-Capillary 99 65 - 99 mg/dL   Comment 1 Notify RN   Glucose, capillary     Status: Abnormal   Collection Time: 04/29/18  3:17 AM  Result Value Ref Range   Glucose-Capillary 105 (H) 65 - 99 mg/dL   Comment 1 Arterial Specimen  Heparin level (unfractionated)     Status: Abnormal   Collection Time: 04/29/18  3:20 AM  Result Value Ref Range   Heparin Unfractionated 0.17 (L) 0.30 - 0.70 IU/mL    Comment: (NOTE) If heparin results are below expected values, and patient dosage has  been confirmed, suggest follow up testing of antithrombin III levels. Performed at [facility], [place] [address] [place], [place] [address]   Procalcitonin     Status: None   Collection Time: 04/29/18  3:20 AM  Result Value Ref Range   Procalcitonin 1.10 ng/mL    Comment:        Interpretation: PCT > 0.5 ng/mL and <= 2 ng/mL: Systemic infection (sepsis) is possible, but other conditions are known to elevate PCT as well. (NOTE)       Sepsis PCT Algorithm           Lower Respiratory Tract                                      Infection PCT Algorithm    ----------------------------     ----------------------------         PCT < 0.25 ng/mL                PCT < 0.10 ng/mL         Strongly encourage             Strongly discourage   discontinuation of antibiotics    initiation of antibiotics    ----------------------------     -----------------------------       PCT 0.25 - 0.50 ng/mL            PCT 0.10 - 0.25 ng/mL               OR       >80% decrease in PCT            Discourage initiation of                                             antibiotics      Encourage discontinuation           of antibiotics    ----------------------------     -----------------------------         PCT >= 0.50 ng/mL              PCT 0.26 - 0.50 ng/mL                AND       <80% decrease in PCT             Encourage initiation of                                             antibiotics       Encourage continuation           of antibiotics    ----------------------------     -----------------------------        PCT >= 0.50 ng/mL                  PCT > 0.50 ng/mL  AND         increase in PCT                  Strongly encourage                                      initiation of antibiotics    Strongly encourage escalation           of antibiotics                                     -----------------------------                                           PCT <= 0.25 ng/mL                                                 OR                                        > 80% decrease in PCT                                     Discontinue / Do not initiate                                             antibiotics Performed at [facility], [address], [place], [place] [address]   Magnesium     Status: None   Collection Time: 04/29/18  3:20 AM  Result Value Ref Range   Magnesium 1.7 1.7 - 2.4 mg/dL    Comment: Performed at [facility] [address], [place], [place] [address]  CBC     Status: Abnormal   Collection Time: 04/29/18  3:20 AM  Result Value Ref Range   WBC 8.6 4.0 - 10.5 K/uL   RBC 3.92 3.87 - 5.11 MIL/uL   Hemoglobin 9.5 (L) 12.0 - 15.0 g/dL   HCT 28.6 (L) 36.0 - 46.0 %   MCV 73.0 (L) 78.0 - 100.0 fL   MCH 24.2 (L) 26.0 - 34.0 pg   MCHC 33.2 30.0 - 36.0 g/dL   RDW 17.3 (H) 11.5 - 15.5 %   Platelets 267 150 - 400 K/uL    Comment: Performed at [facility], [place] [address] [place],  [address]  Basic metabolic panel     Status: Abnormal   Collection Time: 04/29/18  3:20 AM  Result Value  Ref Range   Sodium 133 (L) 135 - 145 mmol/L   Potassium 3.2 (L) 3.5 - 5.1 mmol/L   Chloride 93 (L) 101 - 111 mmol/L   CO2 26 22 - 32 mmol/L   Glucose, Bld 103 (H) 65 - 99 mg/dL   BUN 68 (H)  6 - 20 mg/dL   Creatinine, Ser 7.61 (H) 0.44 - 1.00 mg/dL   Calcium 7.0 (L) 8.9 - 10.3 mg/dL   GFR calc non Af Amer 5 (L) >60 mL/min   GFR calc Af Amer 6 (L) >60 mL/min    Comment: (NOTE) The eGFR has been calculated using the CKD EPI equation. This calculation has not been validated in all clinical situations. eGFR's persistently <60 mL/min signify possible Chronic Kidney Disease.    Anion gap 14 5 - 15    Comment: Performed at [facility] [address] [place], [place] [address]  I-STAT 3, arterial blood gas (G3+)     Status: Abnormal   Collection Time: 04/29/18  3:24 AM  Result Value Ref Range   pH, Arterial 7.454 (H) 7.350 - 7.450   pCO2 arterial 37.8 32.0 - 48.0 mmHg   pO2, Arterial 149.0 (H) 83.0 - 108.0 mmHg   Bicarbonate 26.7 20.0 - 28.0 mmol/L   TCO2 28 22 - 32 mmol/L   O2 Saturation 99.0 %   Acid-Base Excess 3.0 (H) 0.0 - 2.0 mmol/L   Patient temperature 97.7 F    Collection site ARTERIAL LINE    Drawn by Operator    Sample type ARTERIAL   Glucose, capillary     Status: None   Collection Time: 04/29/18  7:22 AM  Result Value Ref Range   Glucose-Capillary 96 65 - 99 mg/dL    ECG   N/A  Telemetry   Sinus rhythm - Personally Reviewed  Radiology    Dg Chest Port 1 View  Result Date: 04/29/2018 CLINICAL DATA:  Hypoxia EXAM: PORTABLE CHEST 1 VIEW COMPARISON:  April 28, 2018 FINDINGS: Endotracheal tube tip is 2.1 cm above the carina. Right central catheter tip is in the superior vena cava. Left central catheter tip is at the junction of the left innominate vein and superior vena cava, stable. Nasogastric tube tip and side port below the diaphragm. No pneumothorax. There is bibasilar atelectasis with small pleural effusion on the right. No consolidation. Heart is  mildly enlarged with pulmonary vascularity normal. No adenopathy. No bone lesions. IMPRESSION: Tube and catheter positions as described without pneumothorax. Mild bibasilar atelectasis. Small right pleural effusion. No consolidation. Stable cardiac prominence. Electronically Signed   By: Lowella Grip III M.D.   On: 04/29/2018 07:34   Dg Chest Port 1 View  Result Date: 04/28/2018 CLINICAL DATA:  ETT placement and HD catheter placement EXAM: PORTABLE CHEST 1 VIEW COMPARISON:  04/27/2018. FINDINGS: Cardiomegaly. HD catheter tip distal SVC. ET tube tip 3.3 cm above carina. BILATERAL pulmonary opacities, RIGHT worse than LEFT, consistent with pulmonary edema. Central venous catheter tip unchanged from LEFT IJ approach, proximal SVC. No pneumothorax. IMPRESSION: Cardiomegaly with pulmonary edema may be minimally worse. ET tube and HD catheter appear in satisfactory position. No pneumothorax. Electronically Signed   By: Staci Righter M.D.   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 [name] has had a large anterior NSTEMI with LVEF 30-35%. She is in decompensated CHF and anuric renal failure - ?prognosis for renal recovery. Will ultimately require LHC - preferably when she is more volume compensated, extubated, alert and able to provide informed consent - at this point, there is no urgency to proceed to cath. Continue aspirin,  heparin, and crestor. Add low dose coreg when BP allows and patient is more compensated from a volume standpoint.  Time Spent Directly with Patient:  I have spent a total of 25 minutes with the patient reviewing hospital notes, telemetry, EKGs, labs and examining the patient as well as establishing an assessment and plan that was discussed personally with the patient. > 50% of time was spent in direct patient care.  Length of Stay:  LOS: 4 days   Pixie Casino, MD, [facility], [place] Director of the Advanced Lipid Disorders &  Cardiovascular Risk Reduction Clinic Diplomate of the American Board of Clinical Lipidology Attending Cardiologist  Direct Dial: [phone]  Fax: [phone]  Website:  [email or web address] Veora Fonte 04/29/2018, 8:59 AM

## 2018-04-29 NOTE — Progress Notes (Signed)
Tried calling husband [name] to get consent for Dialysis -left message on cell #, also called home # no answer and no answering machine - called Pt's sister listed in emergency contacts - she is not POA says the husband went [place] a [place] campground to get a camper - maybe out of cell reach. Called DR Joelyn Oms to make aware of not being able to reach husband.

## 2018-04-29 NOTE — Care Management Note (Signed)
Case Management Note  Patient Details  Name: [name] MRN: [id number] Date of Birth: Mar 23, 1966  Subjective/Objective:   Pt admitted with septic shock - pt had a recent fem pop bypass and now has new occulsion              Action/Plan:  PTA from home with husband.  Pt is intubated requiring CRRT.  CM will continue to follow for discharge needs   Expected Discharge Date:                  Expected Discharge Plan:     In-House Referral:  Clinical Social Work  Discharge planning Services  CM Consult  Post Acute Care Choice:    Choice offered to:     DME Arranged:    DME Agency:     HH Arranged:    [place] Agency:     Status of Service:     If discussed at [employer], dates discussed:    Additional Comments:  [name], RN 04/29/2018, 4:42 PM

## 2018-04-29 NOTE — Progress Notes (Signed)
Started Levo when Dialysis started due to drop in BP.- will continue to titrate as needed

## 2018-04-29 NOTE — Progress Notes (Signed)
ANTICOAGULATION CONSULT NOTE   Pharmacy Consult for Heparin  Indication: atrial fibrillation  No Known Allergies  Patient Measurements: Height: 5\' 7"  (170.2 cm) Weight: 200 lb 13.4 oz (91.1 kg) IBW/kg (Calculated) : 61.6  Vital Signs: Temp: 98.3 F (36.8 C) (06/12 1923) Temp Source: Oral (06/12 1923) BP: 117/81 (06/12 2200) Pulse Rate: 77 (06/12 2230)  Labs: Recent Labs    04/27/18 0435  04/27/18 1423 04/28/18 0500 04/28/18 1100 04/29/18 0320 04/29/18 1135 04/29/18 2044  HGB 9.9*  --   --   --  9.0* 9.5*  --   --   HCT 31.0*  --   --   --  27.3* 28.6*  --   --   PLT 311  --   --   --  284 267  --   --   APTT  --   --  72* 96*  --   --   --   --   HEPARINUNFRC  --    < > 0.28* 0.33  --  0.17* 0.28* 0.93*  CREATININE 7.79*  --   --  7.72*  --  7.61*  --   --   CKTOTAL 221  --   --  78  --   --   --   --   TROPONINI  --   --   --  1.49*  --   --   --   --    < > = values in this interval not displayed.      Assessment: 52 y/o F on Xarelto PTA for afib, was transitioned to heparin while inpatient and peri-operatively. 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 Heparin was resumed s/p procedure and now s/p temp HD cath   Unfortunately, the patient suffered a NSTEMI peri-operatively; therefore, heparin is being continued for dual indications. Appears that the plan is to treat the NSTEMI with medical management for the time being. Heparin level is 0.93 units/ml   Goal of Therapy:  Heparin level 0.3-0.7 units/ml aPTT 66-102 seconds Monitor platelets by anticoagulation protocol: Yes    Plan:  -Decrease heparin infusion to 1350 units/hr -Daily HL, CBC -F/u plan for resuming oral anticoagulation  Thanks for allowing pharmacy to be a part of this patient's care.  Excell Seltzer, PharmD Clinical Pharmacist

## 2018-04-29 NOTE — Progress Notes (Signed)
Admit: 04/24/2018 LOS: 4  39F with anuric AKI related to ATN from septic shock, ischemic/gangrenous LLL s/p L AKA  Subjective:  Intubated for AMS Temp HD cath placed Very liltte UOP off pressors TTE with new depressed LVEF and RMWA   06/11 0701 - 06/12 0700 In: 3814 [P.O.:30; I.V.:3574; NG/GT:50; IV Piggyback:150] Out: 519 [Urine:119; Emesis/NG output:300; Stool:100]  Filed Weights   04/27/18 0500 04/28/18 0500 04/29/18 0300  Weight: 82.7 kg (182 lb 5.1 oz) 82.7 kg (182 lb 5.1 oz) 91.1 kg (200 lb 13.4 oz)    Scheduled Meds: . aspirin  81 mg Oral Daily  . chlorhexidine gluconate (MEDLINE KIT)  15 mL Mouth Rinse BID  . Chlorhexidine Gluconate Cloth  6 each Topical Daily  . insulin aspart  0-15 Units Subcutaneous Q4H  . mouth rinse  15 mL Mouth Rinse 10 times per day  . mupirocin ointment  1 application Nasal BID  . rosuvastatin  20 mg Oral q1800  . sodium chloride flush  10-40 mL Intracatheter Q12H   Continuous Infusions: . sodium chloride 10 mL/hr at 04/29/18 0400  . sodium chloride    . cefTAZidime (FORTAZ)  IV Stopped (04/28/18 1412)  . dexmedetomidine (PRECEDEX) IV infusion 0.3 mcg/kg/hr (04/29/18 0400)  . famotidine (PEPCID) IV Stopped (04/28/18 1038)  . heparin 1,300 Units/hr (04/29/18 0416)  . norepinephrine (LEVOPHED) Adult infusion Stopped (04/28/18 1858)  .  sodium bicarbonate (isotonic) infusion in sterile water 125 mL/hr at 04/29/18 0400   PRN Meds:.sodium chloride, Place/Maintain arterial line **AND** sodium chloride, docusate, fentaNYL (SUBLIMAZE) injection, fentaNYL (SUBLIMAZE) injection, morphine injection, nitroGLYCERIN, ondansetron (ZOFRAN) IV, sodium chloride flush  Current Labs: reviewed    Physical Exam:  Blood pressure 118/65, pulse 70, temperature 98.3 F (36.8 C), temperature source Oral, resp. rate 18, height '5\' 7"'  (1.702 m), weight 91.1 kg (200 lb 13.4 oz), SpO2 100 %. GEN: Chronically ill-appearing, in mild distress that she cannot go outside,  tearful ENT: NCAT, poor dentition EYES: EOMI CV: RRR, no rub, normal S1 and S2 PULM: CTAB anteriorly ABD: Soft, nontender SKIN: Left stump bandaged EXT: Right lower extremity without edema  A 1. Anuric AKI from ATN, normal baseline SCr 2. 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 Septic shock, improved but on NE still 4. DM2 5. NSTEMI, cardiology following new depressed LVEF and RWMA on 6/11 TTE 6. Hx/o AFib   P 1. HD today, separate:  4K, 2L UF, Qb 250, 2.5h, no  Bolus heparin   Pearson Grippe MD 04/29/2018, 8:03 AM  Recent Labs  Lab 04/25/18 0318  04/26/18 0404  04/27/18 0435 04/28/18 0500 04/29/18 0320  NA 132*   < > 134*   < > 133* 132* 133*  K 4.0   < > 4.1   < > 3.9 3.7 3.2*  CL 100*   < > 102   < > 102 97* 93*  CO2 17*   < > 16*   < > 18* 21* 26  GLUCOSE 169*   < > 136*   < > 185* 131* 103*  BUN 57*   < > 56*   < > 58* 62* 68*  CREATININE 7.54*   < > 7.63*   < > 7.79* 7.72* 7.61*  CALCIUM 7.4*   < > 7.6*   < > 8.0* 7.6* 7.0*  PHOS 7.0*  --  7.9*  --   --   --   --    < > = values in this interval not  displayed.   Recent Labs  Lab 04/24/18 2345  04/27/18 0435 04/28/18 1100 04/29/18 0320  WBC 19.6*   < > 12.3* 11.7* 8.6  NEUTROABS 17.1*  --   --   --   --   HGB 7.3*   < > 9.9* 9.0* 9.5*  HCT 23.4*   < > 31.0* 27.3* 28.6*  MCV 75.7*   < > 76.0* 75.0* 73.0*  PLT 311   < > 311 284 267   < > = values in this interval not displayed.

## 2018-04-29 NOTE — Progress Notes (Addendum)
PULMONARY / CRITICAL CARE MEDICINE   Name: [name] MRN: [id number] DOB: 09-05-1966    ADMISSION DATE:  04/24/2018 CONSULTATION DATE:  04/25/2018  REFERRING MD:  Dr. 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 Post-Op patient lost pulse to left foot and was taken back to OR for thrombectomy. On 6/3 patient went to outpatient vascular appointment where she was found to by hypotensive and febrile with a occlusion noted to bypass graft. Patient was told at this time to got to ED however, she was going on a camping trip in which she did not want to miss. Sent home with clindamycin.   On 6/7 patient presented to ED with AMS with pain to left lower leg and emesis and diarrhea. On arrival BP 71/48. LA 1.14. Given 3.5L NS without improvement, started on levophed gtt. Administered Vancomycin and Zosyn. LLE with no pulse and gangrenous. Vascular Consulted. Plans for BKA on Monday. PCCM asked to admit.  Patient states she took Xarelto last on 6/7.   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  Course has been complicated by AKI and NSTEMI.  SUBJECTIVE:  On PRVC: 40%, 5 peep Sedated on 0.3 mcg precedex, arouses to voice and follows commands Afebrile No vasopressors infusing Oliguria (120 ml urine last 24 hrs)  VITAL SIGNS: BP 118/65   Pulse 70   Temp 98.3 F (36.8 C) (Oral)   Resp 18   Ht 5\' 7"  (1.702 m)   Wt 200 lb 13.4 oz (91.1 kg)   SpO2 100%   BMI 31.46 kg/m   HEMODYNAMICS: CVP:  [12 mmHg-15 mmHg] 12 mmHg  VENTILATOR SETTINGS: Vent Mode: PRVC FiO2 (%):  [40 %] 40 % Set Rate:  [18 bmp] 18 bmp Vt Set:  [490 mL] 490 mL PEEP:  [5 cmH20] 5 cmH20 Plateau Pressure:  [18 cmH20-20 cmH20] 18 cmH20  INTAKE / OUTPUT: I/O last 3 completed shifts: In:  5885.3 [P.O.:30; I.V.:5345.3; Other:10; NG/GT:50; IV Piggyback:450] Out: 644 [Urine:154; Emesis/NG output:300; Stool:100]  PHYSICAL EXAMINATION: General: Acutely ill-appearing female, laying in bed, intubated on PRVC, in no acute distress Neuro: Sedated, arouses to voice, follows simple commands, right pupil 5 mm and left 3 mm (no change from previous) HEENT: Atraumatic, normocephalic, ETT in place, neck supple Cardiovascular: RRR, No M/R/G Lungs: Coarse lung sounds throughout, even, non-labored, ventilator assisted, no assessory muscle use  Abdomen: Soft, non-tender, non-distended, BS+ x4 Musculoskeletal: LLE AKA, 1+ edema [name]  Skin: Dry, warm. Left AKA wrapped in dressing.  Ulceration to right hip   LABS:  BMET Recent Labs  Lab 04/27/18 0435 04/28/18 0500 04/29/18 0320  NA 133* 132* 133*  K 3.9 3.7 3.2*  CL 102 97* 93*  CO2 18* 21* 26  BUN 58* 62* 68*  CREATININE 7.79* 7.72* 7.61*  GLUCOSE 185* 131* 103*    Electrolytes Recent Labs  Lab 04/25/18 0318  04/26/18 0404  04/27/18 0435 04/28/18 0500 04/29/18 0320  CALCIUM 7.4*   < > 7.6*   < > 8.0* 7.6* 7.0*  MG 1.1*  --  1.1*  --  1.4* 1.5* 1.7  PHOS 7.0*  --  7.9*  --   --   --   --    < > = values  in this interval not displayed.    CBC Recent Labs  Lab 04/27/18 0435 04/28/18 1100 04/29/18 0320  WBC 12.3* 11.7* 8.6  HGB 9.9* 9.0* 9.5*  HCT 31.0* 27.3* 28.6*  PLT 311 284 267    Coag's Recent Labs  Lab 04/25/18 2215 04/27/18 1423 04/28/18 0500  APTT 47* 72* 96*    Sepsis Markers Recent Labs  Lab 04/25/18 0000  04/26/18 2108 04/27/18 0427 04/27/18 0435 04/28/18 0500 04/29/18 0320  LATICACIDVEN 1.14  --  0.7 0.8  --   --   --   PROCALCITON  --    < >  --   --  2.36 1.69 1.10   < > = values in this interval not displayed.    ABG Recent Labs  Lab 04/28/18 1756 04/29/18 0324  PHART 7.452* 7.454*  PCO2ART 39.1 37.8  PO2ART 178.0* 149.0*    Liver Enzymes Recent Labs  Lab 04/24/18 2345   AST 26  ALT 10*  ALKPHOS 131*  BILITOT 0.6  ALBUMIN 1.8*    Cardiac Enzymes Recent Labs  Lab 04/26/18 0404 04/26/18 2000 04/28/18 0500  TROPONINI 6.50* 2.37* 1.49*    Glucose Recent Labs  Lab 04/28/18 0707 04/28/18 1107 04/28/18 1548 04/28/18 1921 04/28/18 2343 04/29/18 0317  GLUCAP 145* 114* 103* 109* 99 105*    Imaging Dg Chest Port 1 View  Result Date: 04/29/2018 CLINICAL DATA:  Hypoxia EXAM: PORTABLE CHEST 1 VIEW COMPARISON:  April 28, 2018 FINDINGS: Endotracheal tube tip is 2.1 cm above the carina. Right central catheter tip is in the superior vena cava. Left central catheter tip is at the junction of the left innominate vein and superior vena cava, stable. Nasogastric tube tip and side port below the diaphragm. No pneumothorax. There is bibasilar atelectasis with small pleural effusion on the right. No consolidation. Heart is mildly enlarged with pulmonary vascularity normal. No adenopathy. No bone lesions. IMPRESSION: Tube and catheter positions as described without pneumothorax. Mild bibasilar atelectasis. Small right pleural effusion. No consolidation. Stable cardiac prominence. Electronically Signed   By: Lowella Grip III M.D.   On: 04/29/2018 07:34   Dg Chest Port 1 View  Result Date: 04/28/2018 CLINICAL DATA:  ETT placement and HD catheter placement EXAM: PORTABLE CHEST 1 VIEW COMPARISON:  04/27/2018. FINDINGS: Cardiomegaly. HD catheter tip distal SVC. ET tube tip 3.3 cm above carina. BILATERAL pulmonary opacities, RIGHT worse than LEFT, consistent with pulmonary edema. Central venous catheter tip unchanged from LEFT IJ approach, proximal SVC. No pneumothorax. IMPRESSION: Cardiomegaly with pulmonary edema may be minimally worse. ET tube and HD catheter appear in satisfactory position. No pneumothorax. Electronically Signed   By: Staci Righter M.D.   On: 04/28/2018 16:57     STUDIES:  CXR 6/7 > Moderate cardiomegaly, stable. Mediastinal silhouette within  normal Limits. No focal infiltrates. No pulmonary edema or visible pleural effusion. No pneumothorax. Osseous structures within normal limits ECHO 6/11>> When compared to the prior study from 04/17/2017 LVEF has decreased from 55-60% to 30-35% with new wall motion abnormalities - diffuse hypokinesis and akinesis of the basal and mid anteroseptal, anterior and apical septal and anterior walls.  RVEF is moderately decreased  CULTURES: Blood 6/7 >> UC 6/7 >>negative   ANTIBIOTICS: Vancomycin 6/7 >> Zosyn 6/7 >>6/11 Ceftazidime 6/11>>  SIGNIFICANT EVENTS: 6/7 > Presents to ED  6/9>> Emergent Left AKA per Vascular surgery 6/11>>Nephrology consulted for worsening oliguric renal failure, and metabolic acidosis 7/82>>UMPNTIRWE  6/12>>1st run of HD  LINES/TUBES: PIV  R IJ  Temporary HD cath 6/11>> ETT 6/11>>   DISCUSSION: 52 year old female presents in Septic Shock secondary to gangrenous left foot.  Subsequent development of oliguric AKI and elevated troponins.  Status post emergent left AKA on 6/9 per vascular surgery. On 6/11 has acute decompensation and severe AMS and confusion requiring intubation, temporary HD cath placed.  Plan for run of HD on 6/12.  ASSESSMENT / PLAN:  PULMONARY A:  Intubated due to AMS & severe confusion P: PRVC: TV 8 cc/kg SBT after pt receives HD today, hopeful for extubation after HD VAP Bundle Pulmonary hygiene F/u ABG prn CXR in am 6/13  CARDIAC A: Septic Shock in setting of gangrenous left foot>>improving -s/p L AKA on 6/9 Elevated troponin, possible stress related non-ST elevation MI in setting of hypotension Acute HFrEF (EF 30-35% on ECHO 6/11) in setting of NSTEMI H/O A.Fib on Xarelto, PAD  P:  Maintain MAP>65 Levophed as needed to maintain MAP goal, wean as tolerated Follow CVP  Follow PCT S/p left AKA on 6/9 Cardiology following, appreciate input Per Cardiology, conservative medical management of NSTEMI, will need cardiac cath at some  point Heparin gtt per pharmacy HD today for volume removal   RENAL A: Acute Kidney Injury in setting of septic shock vs Rhabdo; oliguria Non-anion gap metabolic acidosis>>improving with Bicarb gtt Mild rhabdomyolysis, CK 1049>>resolved Hypokalemia Crt 8.12 >> 7.41>>7.79 CK>>1049>>221 LA 1.14  Renal ultrasound 6/8 normal P:   Nephrology following, appreciate input Per Nephrology, will attempt run of HD today Monitor I&O's / urine output Trend BMP Continue Bicarb gtt @ 125 ml/hr for now, will discontinue when pt receiving HD Replace electrolytes as indicated HD for repletion of K on 6/12  GI A: GERD Nausea  P:   NPO for now If unable to be extubated later today, will consult dietician for TF Zofran prn  Pepcid for SUP  HEMATOLOGIC A: Anemia  Iron deficiency P:  Continue Heparin gtt per pharmacy for anticoagulation (NSTEMI, hx a-fib) Monitor for s/sx of bleeding Follow CBC Transfuse for Hgb <7  INFECTIOUS A: Gangrenous Left Leg  -WBC 8.6 (previously 21.6 on 6/9), Afebrile (previously febrile), PCT 1.1 (peaked 2.88)  P:   Monitor fever curve Trend WBC's Abx as above S/p left AKA on 6/9 Follow blood cultures Trend PCT  ENDOCRINE A: DM   P:   CBG's SSI Follow ICU hypo/hyperglycemia protocol  NEURO A: Sedation needs for mechanical ventilation Acute encephalopathy in setting of sepsis and AKI P: RASS goal: 0 to -1 Precedex gtt to maintain RASS goal Avoid sedating medications as able Provide supportive care Lights on during the day HD today 6/12  FAMILY  - Updates: No family at bedside in am 6/12.   - Inter-disciplinary family meet or Palliative Care meeting due by: 05/02/2018    [name], AGACNP-BC [facility] 04/29/2018, 8:18 AM  Attending Note:  52 year old female with extensive PMH including vascular disease who developed renal failure from septic shock and was not protecting her airway requiring  intubation on 6/11.  Patient will requiring dialysis to address BUN prior to serious consideration of extubation.  Off pressors, clear lungs on exam with ETT in good position on CXR that I reviewed myself.  Will continue full vent support.  HD today.  Will begin PS trials in AM pending mental status and BUN level.  The patient is critically ill with multiple organ systems failure and requires high complexity decision making for assessment and support, frequent  evaluation and titration of therapies, application of advanced monitoring technologies and extensive interpretation of multiple databases.   Critical Care Time devoted to patient care services described in this note is  34  Minutes. This time reflects time of care of this signee Dr [name]. This critical care time does not reflect procedure time, or teaching time or supervisory time of PA/NP/Med student/Med Resident etc but could involve care discussion time.  Rush Farmer, M.D. [facility] Pulmonary/Critical Care Medicine. Pager: [phone]. After hours pager: [phone].

## 2018-04-29 NOTE — Progress Notes (Signed)
Called husband [name] to get consent for dialysis- left message on cell #, no answer at home number and no answering machine.

## 2018-04-29 NOTE — Progress Notes (Addendum)
Initial Nutrition Assessment  DOCUMENTATION CODES:   Not applicable  INTERVENTION:   If pt unable to extubate in 24 hours:  Vital High Protein @ 65 ml/hr  Provides: 1560 kcals, 137 grams protein, 1310 ml free water. Meets 100% of needs.   NUTRITION DIAGNOSIS:   Increased nutrient needs related to wound healing as evidenced by estimated needs.  GOAL:   Patient will meet greater than or equal to 90% of their needs  MONITOR:   Diet advancement, Vent status, Labs, Weight trends, TF tolerance, Skin, I & O's  REASON FOR ASSESSMENT:   Ventilator   ASSESSMENT:   Patient with PMH significant for DM, diabetic neuropathy, HTN, PAD, and left femoropopliteal bypass graft occlusion. Recently admitted 5/10-5/14 for redo of Left femoral to PTA bypass with cryopreserved vein. Pt presents this admission with septic shock and ischemic/dgangrenous left lower extremity after failed attempt at revascularization.    6/9- emergent left BKA 6/11- pt intubated, OGT in place, HD catheter insertion 6/12- first run of HD  Pt currently on levophed due to drop in BP while undergoing HD.  Plan to attempt SBT after HD treatment.  Plan to start TF if pt unable to extubate today. See recs above.  Weight noted to increase from 173 lb on 6/8 to 200 lb today. Will use admission weight of 78.8 kg.  No family at bedside to provide nutrition history.   Patient is currently intubated on ventilator support MV: 8.6 L/min Temp (24hrs), Avg:97.9 F (36.6 C), Min:97.6 F (36.4 C), Max:98.4 F (36.9 C) BP: 103/73 MAP: 83  I/O: +22771 ml since admit- on HD during RD assessment UOP: 519 ml x 24 hrs  Medications reviewed and include: precedex, sodium bicarbonate Labs reviewed: Na 133 (L) K 3.2 (L)   NUTRITION - FOCUSED PHYSICAL EXAM:    Most Recent Value  Orbital Region  No depletion  Upper Arm Region  No depletion  Thoracic and Lumbar Region  Unable to assess  Buccal Region  No depletion  Temple  Region  No depletion  Clavicle Bone Region  No depletion  Clavicle and Acromion Bone Region  No depletion  Scapular Bone Region  Unable to assess  Dorsal Hand  No depletion  Patellar Region  No depletion  Anterior Thigh Region  No depletion  Posterior Calf Region  No depletion  Edema (RD Assessment)  Mild  Hair  Reviewed  Eyes  Unable to assess  Mouth  Unable to assess  Skin  Reviewed  Nails  Reviewed     Diet Order:   Diet Order           Diet NPO time specified  Diet effective now          EDUCATION NEEDS:   Not appropriate for education at this time  Skin:  Skin Assessment: Skin Integrity Issues: Skin Integrity Issues:: Stage III, Incisions Stage III: buttocks Incisions: left leg s/p BKA  Last BM:  04/29/18- 100 ml rectal tube  Height:   Ht Readings from Last 1 Encounters:  04/24/18 5\' 7"  (1.702 m)    Weight:   Wt Readings from Last 1 Encounters:  04/29/18 200 lb 13.4 oz (91.1 kg)    Ideal Body Weight:  57.1 kg(adjusted L BKA)  BMI:  Body mass index is 31.46 kg/m.  Estimated Nutritional Needs:   Kcal:  1531 kcal  Protein:  118- 157 g (1.5-2 g/kg)  Fluid:  1000 ml + UOP    Mariana Single RD, LDN Clinical Nutrition  Pager # - [phone]

## 2018-04-29 NOTE — Progress Notes (Signed)
ANTICOAGULATION CONSULT NOTE   Pharmacy Consult for Heparin  Indication: atrial fibrillation  No Known Allergies  Patient Measurements: Height: 5\' 7"  (170.2 cm) Weight: 200 lb 13.4 oz (91.1 kg) IBW/kg (Calculated) : 61.6  Vital Signs: Temp: 97.7 F (36.5 C) (06/12 0341) Temp Source: Oral (06/12 0341) BP: 115/68 (06/12 0400) Pulse Rate: 69 (06/12 0400)  Labs: Recent Labs    04/26/18 2000  04/27/18 0435 04/27/18 1423 04/28/18 0500 04/28/18 1100 04/29/18 0320  HGB  --    < > 9.9*  --   --  9.0* 9.5*  HCT  --   --  31.0*  --   --  27.3* 28.6*  PLT  --   --  311  --   --  284 267  APTT  --   --   --  72* 96*  --   --   HEPARINUNFRC  --   --   --  0.28* 0.33  --  0.17*  CREATININE 7.80*  --  7.79*  --  7.72*  --   --   CKTOTAL  --   --  221  --  78  --   --   TROPONINI 2.37*  --   --   --  1.49*  --   --    < > = values in this interval not displayed.      Assessment: 52 y/o F on Xarelto PTA for afib, was transitioned to heparin while inpatient and peri-operatively. 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 Heparin was resumed s/p procedure and now s/p temp HD cath   Unfortunately, the patient suffered a NSTEMI peri-operatively; therefore, heparin is being continued for dual indications. Appears that the plan is to treat the NSTEMI with medical management for the time being. Heparin level is 0.17 units/ml.  No issues with infusion per RN   Goal of Therapy:  Heparin level 0.3-0.7 units/ml aPTT 66-102 seconds Monitor platelets by anticoagulation protocol: Yes    Plan:  -Increase heparin infusion to 1300 units/hr -Daily HL, CBC -F/u plan for resuming oral anticoagulation  Thanks for allowing pharmacy to be a part of this patient's care.  Excell Seltzer, PharmD Clinical Pharmacist

## 2018-04-30 ENCOUNTER — Inpatient Hospital Stay (HOSPITAL_COMMUNITY): Payer: Medicare Other

## 2018-04-30 DIAGNOSIS — I5021 Acute systolic (congestive) heart failure: Secondary | ICD-10-CM

## 2018-04-30 LAB — CBC
HEMATOCRIT: 28 % — AB (ref 36.0–46.0)
HEMOGLOBIN: 9.1 g/dL — AB (ref 12.0–15.0)
MCH: 24.6 pg — AB (ref 26.0–34.0)
MCHC: 32.5 g/dL (ref 30.0–36.0)
MCV: 75.7 fL — ABNORMAL LOW (ref 78.0–100.0)
Platelets: 210 10*3/uL (ref 150–400)
RBC: 3.7 MIL/uL — ABNORMAL LOW (ref 3.87–5.11)
RDW: 17.4 % — ABNORMAL HIGH (ref 11.5–15.5)
WBC: 10.2 10*3/uL (ref 4.0–10.5)

## 2018-04-30 LAB — CULTURE, BLOOD (ROUTINE X 2): CULTURE: NO GROWTH

## 2018-04-30 LAB — GLUCOSE, CAPILLARY
GLUCOSE-CAPILLARY: 73 mg/dL (ref 65–99)
GLUCOSE-CAPILLARY: 79 mg/dL (ref 65–99)
GLUCOSE-CAPILLARY: 73 mg/dL (ref 65–99)
GLUCOSE-CAPILLARY: 105 mg/dL — AB (ref 65–99)
Glucose-Capillary: 78 mg/dL (ref 65–99)

## 2018-04-30 LAB — BASIC METABOLIC PANEL
Anion gap: 15 (ref 5–15)
BUN: 50 mg/dL — AB (ref 6–20)
CHLORIDE: 94 mmol/L — AB (ref 101–111)
CO2: 26 mmol/L (ref 22–32)
CREATININE: 6.38 mg/dL — AB (ref 0.44–1.00)
Calcium: 7 mg/dL — ABNORMAL LOW (ref 8.9–10.3)
GFR calc Af Amer: 8 mL/min — ABNORMAL LOW (ref >60)
GFR calc non Af Amer: 7 mL/min — ABNORMAL LOW (ref >60)
GLUCOSE: 82 mg/dL (ref 65–99)
Potassium: 3.1 mmol/L — ABNORMAL LOW (ref 3.5–5.1)
Sodium: 135 mmol/L (ref 135–145)

## 2018-04-30 LAB — VANCOMYCIN, RANDOM: Vancomycin Rm: 24

## 2018-04-30 LAB — HEPATITIS B SURFACE ANTIBODY,QUALITATIVE: Hep B S Ab: NONREACTIVE

## 2018-04-30 LAB — HEPARIN LEVEL (UNFRACTIONATED): Heparin Unfractionated: 0.35 IU/mL (ref 0.30–0.70)

## 2018-04-30 LAB — TROPONIN I
TROPONIN I: 0.68 ng/mL — AB (ref <0.03)
Troponin I: 1.14 ng/mL (ref <0.03)
Troponin I: 0.91 ng/mL (ref <0.03)

## 2018-04-30 LAB — POTASSIUM: POTASSIUM: 3.7 mmol/L (ref 3.5–5.1)

## 2018-04-30 LAB — PROCALCITONIN: PROCALCITONIN: 0.75 ng/mL

## 2018-04-30 LAB — HEPATITIS B SURFACE ANTIGEN: Hepatitis B Surface Ag: NEGATIVE

## 2018-04-30 MED ORDER — CHLORHEXIDINE GLUCONATE 0.12% ORAL RINSE (MEDLINE KIT)
15.0000 mL | Freq: Two times a day (BID) | OROMUCOSAL | Status: DC
  Administered 2018-04-30 – 2018-05-01 (×2): 15 mL via OROMUCOSAL

## 2018-04-30 MED ORDER — CHLORHEXIDINE GLUCONATE CLOTH 2 % EX PADS
6.0000 | MEDICATED_PAD | Freq: Every day | CUTANEOUS | Status: DC
  Administered 2018-05-01 – 2018-05-11 (×10): 6 via TOPICAL

## 2018-04-30 MED ORDER — CHLORHEXIDINE GLUCONATE CLOTH 2 % EX PADS: 6.0000 | MEDICATED_PAD | Freq: Every day | CUTANEOUS | Status: DC

## 2018-04-30 MED ORDER — POTASSIUM CHLORIDE 20 MEQ/15ML (10%) PO SOLN
40.0000 meq | Freq: Once | ORAL | Status: AC
  Administered 2018-04-30: 40 meq via ORAL
  Filled 2018-04-30: qty 30

## 2018-04-30 MED ORDER — ORAL CARE MOUTH RINSE: 15.0000 mL | OROMUCOSAL | Status: DC

## 2018-04-30 MED ORDER — VITAL HIGH PROTEIN PO LIQD
1000.0000 mL | ORAL | Status: DC
  Administered 2018-04-30 – 2018-05-01 (×2): 1000 mL

## 2018-04-30 MED ORDER — VANCOMYCIN HCL IN DEXTROSE 750-5 MG/150ML-% IV SOLN
750.0000 mg | Freq: Once | INTRAVENOUS | Status: DC
  Filled 2018-04-30: qty 150

## 2018-04-30 MED ORDER — CHLORHEXIDINE GLUCONATE CLOTH 2 % EX PADS
6.0000 | MEDICATED_PAD | Freq: Every day | CUTANEOUS | Status: DC
  Administered 2018-04-30: 6 via TOPICAL

## 2018-04-30 MED ORDER — ORAL CARE MOUTH RINSE
15.0000 mL | Freq: Four times a day (QID) | OROMUCOSAL | Status: DC
  Administered 2018-04-30 – 2018-05-01 (×3): 15 mL via OROMUCOSAL

## 2018-04-30 NOTE — Progress Notes (Signed)
DAILY PROGRESS NOTE   Patient Name: [name] Date of Encounter: 04/30/2018  Chief Complaint   Intubated, sedated on vent  Patient Profile   52 yo female with DM2, PAD, PAF and recent re-do left femoral to PTA bypass, presented with hypotension/sepsis and found to have elevated troponin c/w NSTEMI.  Subjective   Plans to try and wean vent today - underwent HD with plans for repeat HD tomorrow. T wave inversions noted anterolaterally on EKG today as well as on telemetry.  Objective   Vitals:   04/30/18 0630 04/30/18 0715 04/30/18 0717 04/30/18 0740  BP:  (!) 137/59    Pulse: 61 64 64   Resp: '18 18 15   ' Temp:    98.2 F (36.8 C)  TempSrc:    Axillary  SpO2: 99% 99% 99%   Weight:      Height:        Intake/Output Summary (Last 24 hours) at 04/30/2018 0953 Last data filed at 04/30/2018 0800 Gross per 24 hour  Intake 1409.52 ml  Output 2162 ml  Net -752.48 ml   Filed Weights   04/28/18 0500 04/29/18 0300 04/30/18 0230  Weight: 182 lb 5.1 oz (82.7 kg) 200 lb 13.4 oz (91.1 kg) 200 lb 13.4 oz (91.1 kg)    Physical Exam   General appearance: intubated, sedated on vent Neck: no carotid bruit, no JVD and thyroid not enlarged, symmetric, no tenderness/mass/nodules Lungs: diminished breath sounds bibasilar Heart: regular rate and rhythm Abdomen: soft, non-tender; bowel sounds normal; no masses,  no organomegaly Extremities: poor distal pulses, post-surgical wounds, open sores Pulses: weak pulses Skin: pale, warm, dry Neurologic: Mental status: intubated, sedated on vent, not following commands Psych: Cannot assess  Inpatient Medications    Scheduled Meds: . aspirin  81 mg Oral Daily  . chlorhexidine gluconate (MEDLINE KIT)  15 mL Mouth Rinse BID  . Chlorhexidine Gluconate Cloth  6 each Topical Daily  . insulin aspart  0-15 Units Subcutaneous Q4H  . mouth rinse  15 mL Mouth Rinse QID  . mupirocin ointment  1 application Nasal BID  . potassium chloride  40 mEq  Oral Once  . rosuvastatin  20 mg Oral q1800  . sodium chloride flush  10-40 mL Intracatheter Q12H    Continuous Infusions: . sodium chloride 10 mL/hr at 04/30/18 0400  . sodium chloride    . sodium chloride    . sodium chloride    . cefTAZidime (FORTAZ)  IV Stopped (04/29/18 1730)  . dexmedetomidine (PRECEDEX) IV infusion 0.3 mcg/kg/hr (04/30/18 0800)  . famotidine (PEPCID) IV Stopped (04/29/18 9470)  . heparin 1,350 Units/hr (04/30/18 0450)  . norepinephrine (LEVOPHED) Adult infusion Stopped (04/29/18 1830)    PRN Meds: sodium chloride, Place/Maintain arterial line **AND** sodium chloride, sodium chloride, sodium chloride, alteplase, docusate, fentaNYL (SUBLIMAZE) injection, fentaNYL (SUBLIMAZE) injection, heparin, morphine injection, nitroGLYCERIN, ondansetron (ZOFRAN) IV, sodium chloride flush   Labs   Results for orders placed or performed during the hospital encounter of 04/24/18 (from the past 48 hour(s))  CBC     Status: Abnormal   Collection Time: 04/28/18 11:00 AM  Result Value Ref Range   WBC 11.7 (H) 4.0 - 10.5 K/uL   RBC 3.64 (L) 3.87 - 5.11 MIL/uL   Hemoglobin 9.0 (L) 12.0 - 15.0 g/dL   HCT 27.3 (L) 36.0 - 46.0 %   MCV 75.0 (L) 78.0 - 100.0 fL   MCH 24.7 (L) 26.0 - 34.0 pg   MCHC 33.0 30.0 - 36.0  g/dL   RDW 17.7 (H) 11.5 - 15.5 %   Platelets 284 150 - 400 K/uL    Comment: Performed at [facility], [place] [address] [place], [place] [address]  Glucose, capillary     Status: Abnormal   Collection Time: 04/28/18 11:07 AM  Result Value Ref Range   Glucose-Capillary 114 (H) 65 - 99 mg/dL   Comment 1 Capillary Specimen   Glucose, capillary     Status: Abnormal   Collection Time: 04/28/18  3:48 PM  Result Value Ref Range   Glucose-Capillary 103 (H) 65 - 99 mg/dL   Comment 1 Capillary Specimen   I-STAT 3, arterial blood gas (G3+)     Status: Abnormal   Collection Time: 04/28/18  5:56 PM  Result Value Ref Range   pH, Arterial 7.452 (H) 7.350 - 7.450   pCO2  arterial 39.1 32.0 - 48.0 mmHg   pO2, Arterial 178.0 (H) 83.0 - 108.0 mmHg   Bicarbonate 27.4 20.0 - 28.0 mmol/L   TCO2 29 22 - 32 mmol/L   O2 Saturation 100.0 %   Acid-Base Excess 3.0 (H) 0.0 - 2.0 mmol/L   Patient temperature 97.6 F    Collection site ARTERIAL LINE    Drawn by RT    Sample type ARTERIAL   Glucose, capillary     Status: Abnormal   Collection Time: 04/28/18  7:21 PM  Result Value Ref Range   Glucose-Capillary 109 (H) 65 - 99 mg/dL   Comment 1 Notify RN   Glucose, capillary     Status: None   Collection Time: 04/28/18 11:43 PM  Result Value Ref Range   Glucose-Capillary 99 65 - 99 mg/dL   Comment 1 Notify RN   Glucose, capillary     Status: Abnormal   Collection Time: 04/29/18  3:17 AM  Result Value Ref Range   Glucose-Capillary 105 (H) 65 - 99 mg/dL   Comment 1 Arterial Specimen   Heparin level (unfractionated)     Status: Abnormal   Collection Time: 04/29/18  3:20 AM  Result Value Ref Range   Heparin Unfractionated 0.17 (L) 0.30 - 0.70 IU/mL    Comment: (NOTE) If heparin results are below expected values, and patient dosage has  been confirmed, suggest follow up testing of antithrombin III levels. Performed at [facility], [place] [address] [place], [place] [address]   Procalcitonin     Status: None   Collection Time: 04/29/18  3:20 AM  Result Value Ref Range   Procalcitonin 1.10 ng/mL    Comment:        Interpretation: PCT > 0.5 ng/mL and <= 2 ng/mL: Systemic infection (sepsis) is possible, but other conditions are known to elevate PCT as well. (NOTE)       Sepsis PCT Algorithm           Lower Respiratory Tract                                      Infection PCT Algorithm    ----------------------------     ----------------------------         PCT < 0.25 ng/mL                PCT < 0.10 ng/mL         Strongly encourage             Strongly discourage   discontinuation  of antibiotics    initiation of antibiotics    ----------------------------      -----------------------------       PCT 0.25 - 0.50 ng/mL            PCT 0.10 - 0.25 ng/mL               OR       >80% decrease in PCT            Discourage initiation of                                            antibiotics      Encourage discontinuation           of antibiotics    ----------------------------     -----------------------------         PCT >= 0.50 ng/mL              PCT 0.26 - 0.50 ng/mL                AND       <80% decrease in PCT             Encourage initiation of                                             antibiotics       Encourage continuation           of antibiotics    ----------------------------     -----------------------------        PCT >= 0.50 ng/mL                  PCT > 0.50 ng/mL               AND         increase in PCT                  Strongly encourage                                      initiation of antibiotics    Strongly encourage escalation           of antibiotics                                     -----------------------------                                           PCT <= 0.25 ng/mL                                                 OR                                        >  80% decrease in PCT                                     Discontinue / Do not initiate                                             antibiotics Performed at [facility], [place] [address] [place], [place] [address]   Magnesium     Status: None   Collection Time: 04/29/18  3:20 AM  Result Value Ref Range   Magnesium 1.7 1.7 - 2.4 mg/dL    Comment: Performed at [facility] [address], [place], [place] [address]  CBC     Status: Abnormal   Collection Time: 04/29/18  3:20 AM  Result Value Ref Range   WBC 8.6 4.0 - 10.5 K/uL   RBC 3.92 3.87 - 5.11 MIL/uL   Hemoglobin 9.5 (L) 12.0 - 15.0 g/dL   HCT 28.6 (L) 36.0 - 46.0 %   MCV 73.0 (L) 78.0 - 100.0 fL   MCH 24.2 (L) 26.0 - 34.0 pg   MCHC 33.2 30.0 - 36.0 g/dL   RDW 17.3 (H) 11.5 - 15.5 %     Platelets 267 150 - 400 K/uL    Comment: Performed at [facility], [place] [address] [name], [place] [address]  Basic metabolic panel     Status: Abnormal   Collection Time: 04/29/18  3:20 AM  Result Value Ref Range   Sodium 133 (L) 135 - 145 mmol/L   Potassium 3.2 (L) 3.5 - 5.1 mmol/L   Chloride 93 (L) 101 - 111 mmol/L   CO2 26 22 - 32 mmol/L   Glucose, Bld 103 (H) 65 - 99 mg/dL   BUN 68 (H) 6 - 20 mg/dL   Creatinine, Ser 7.61 (H) 0.44 - 1.00 mg/dL   Calcium 7.0 (L) 8.9 - 10.3 mg/dL   GFR calc non Af Amer 5 (L) >60 mL/min   GFR calc Af Amer 6 (L) >60 mL/min    Comment: (NOTE) The eGFR has been calculated using the CKD EPI equation. This calculation has not been validated in all clinical situations. eGFR's persistently <60 mL/min signify possible Chronic Kidney Disease.    Anion gap 14 5 - 15    Comment: Performed at [facility] [address], [place], [place] [address]  I-STAT 3, arterial blood gas (G3+)     Status: Abnormal   Collection Time: 04/29/18  3:24 AM  Result Value Ref Range   pH, Arterial 7.454 (H) 7.350 - 7.450   pCO2 arterial 37.8 32.0 - 48.0 mmHg   pO2, Arterial 149.0 (H) 83.0 - 108.0 mmHg   Bicarbonate 26.7 20.0 - 28.0 mmol/L   TCO2 28 22 - 32 mmol/L   O2 Saturation 99.0 %   Acid-Base Excess 3.0 (H) 0.0 - 2.0 mmol/L   Patient temperature 97.7 F    Collection site ARTERIAL LINE    Drawn by Operator    Sample type ARTERIAL   Glucose, capillary     Status: None   Collection Time: 04/29/18  7:22 AM  Result Value Ref Range   Glucose-Capillary 96 65 - 99 mg/dL  Glucose, capillary     Status: None   Collection Time: 04/29/18  11:30 AM  Result Value Ref Range   Glucose-Capillary 93 65 - 99 mg/dL   Comment 1 Capillary Specimen    Comment 2 Notify RN   Heparin level (unfractionated)     Status: Abnormal   Collection Time: 04/29/18 11:35 AM  Result Value Ref Range   Heparin Unfractionated 0.28 (L) 0.30 - 0.70 IU/mL    Comment: (NOTE) If  heparin results are below expected values, and patient dosage has  been confirmed, suggest follow up testing of antithrombin III levels. Performed at [facility], [place] [address], [name], [place] [address]   Vancomycin, random     Status: None   Collection Time: 04/29/18 11:35 AM  Result Value Ref Range   Vancomycin Rm 34     Comment:        Random Vancomycin therapeutic range is dependent on dosage and time of specimen collection. A peak range is 20.0-40.0 ug/mL A trough range is 5.0-15.0 ug/mL        Performed at [facility] [address], [place], [place] [address]   Glucose, capillary     Status: None   Collection Time: 04/29/18  3:09 PM  Result Value Ref Range   Glucose-Capillary 79 65 - 99 mg/dL   Comment 1 Capillary Specimen    Comment 2 Notify RN   Hepatitis B surface antibody     Status: None   Collection Time: 04/29/18  4:05 PM  Result Value Ref Range   Hep B S Ab Non Reactive     Comment: (NOTE)              Non Reactive: Inconsistent with immunity,                            less than 10 mIU/mL              Reactive:     Consistent with immunity,                            greater than 9.9 mIU/mL Performed At: [facility] [place], [place] [id number] Rush Farmer MD CL:[id number] Performed at [facility], [place] [address], [place], [place] [address]   Glucose, capillary     Status: None   Collection Time: 04/29/18  7:18 PM  Result Value Ref Range   Glucose-Capillary 77 65 - 99 mg/dL   Comment 1 Capillary Specimen    Comment 2 Notify RN   Heparin level (unfractionated)     Status: Abnormal   Collection Time: 04/29/18  8:44 PM  Result Value Ref Range   Heparin Unfractionated 0.93 (H) 0.30 - 0.70 IU/mL    Comment: (NOTE) If heparin results are below expected values, and patient dosage has  been confirmed, suggest follow up testing of antithrombin III levels. Performed at [facility], [place] [address] [place], [place] [address]   Glucose, capillary     Status: None   Collection Time: 04/29/18 11:06 PM  Result Value Ref Range   Glucose-Capillary 96 65 - 99 mg/dL   Comment 1 Capillary Specimen    Comment 2 Notify RN   Glucose, capillary     Status: None   Collection Time: 04/30/18  3:58 AM  Result Value Ref Range   Glucose-Capillary 73 65 - 99 mg/dL   Comment 1 Capillary Specimen  Comment 2 Notify RN   Heparin level (unfractionated)     Status: None   Collection Time: 04/30/18  5:00 AM  Result Value Ref Range   Heparin Unfractionated 0.35 0.30 - 0.70 IU/mL    Comment: (NOTE) If heparin results are below expected values, and patient dosage has  been confirmed, suggest follow up testing of antithrombin III levels. Performed at [facility], [place] [address], [place], [place] [address]   CBC     Status: Abnormal   Collection Time: 04/30/18  5:00 AM  Result Value Ref Range   WBC 10.2 4.0 - 10.5 K/uL   RBC 3.70 (L) 3.87 - 5.11 MIL/uL   Hemoglobin 9.1 (L) 12.0 - 15.0 g/dL   HCT 28.0 (L) 36.0 - 46.0 %   MCV 75.7 (L) 78.0 - 100.0 fL   MCH 24.6 (L) 26.0 - 34.0 pg   MCHC 32.5 30.0 - 36.0 g/dL   RDW 17.4 (H) 11.5 - 15.5 %   Platelets 210 150 - 400 K/uL    Comment: Performed at [facility], [place] [address] [place], [place] [address]  Basic metabolic panel     Status: Abnormal   Collection Time: 04/30/18  5:00 AM  Result Value Ref Range   Sodium 135 135 - 145 mmol/L   Potassium 3.1 (L) 3.5 - 5.1 mmol/L   Chloride 94 (L) 101 - 111 mmol/L   CO2 26 22 - 32 mmol/L   Glucose, Bld 82 65 - 99 mg/dL   BUN 50 (H) 6 - 20 mg/dL   Creatinine, Ser 6.38 (H) 0.44 - 1.00 mg/dL   Calcium 7.0 (L) 8.9 - 10.3 mg/dL   GFR calc non Af Amer 7 (L) >60 mL/min   GFR calc Af Amer 8 (L) >60 mL/min    Comment: (NOTE) The eGFR has been calculated using the CKD EPI equation. This calculation has not been validated in all clinical situations. eGFR's persistently <60 mL/min signify possible  Chronic Kidney Disease.    Anion gap 15 5 - 15    Comment: Performed at [facility] [address] [place], [name] [address]  Glucose, capillary     Status: None   Collection Time: 04/30/18  7:38 AM  Result Value Ref Range   Glucose-Capillary 78 65 - 99 mg/dL   Comment 1 Notify RN     ECG   Sinus rhythm at 64, recent anterolateral infarct, inferior and lateral TWI's (new) - personally reviewed  Telemetry   Sinus rhythm with T wave inversions - Personally Reviewed  Radiology    Dg Chest Port 1 View  Result Date: 04/30/2018 CLINICAL DATA:  Intubation EXAM: PORTABLE CHEST 1 VIEW COMPARISON:  04/29/2018 FINDINGS: Endotracheal tube in good position. NG tube in the stomach. Central venous catheter tip in the lower SVC unchanged. No pneumothorax Cardiac enlargement. Diffuse bilateral airspace disease has progressed, probable edema. Progression of bibasilar atelectasis and small pleural effusions bilaterally. IMPRESSION: Support lines remain in good position Worsening bilateral airspace disease most consistent with pulmonary edema. Progression of bibasilar atelectasis and bilateral effusions. Electronically Signed   By: Franchot Gallo M.D.   On: 04/30/2018 07:16   Dg Chest Port 1 View  Result Date: 04/29/2018 CLINICAL DATA:  Hypoxia EXAM: PORTABLE CHEST 1 VIEW COMPARISON:  April 28, 2018 FINDINGS: Endotracheal tube tip is 2.1 cm above the carina. Right central catheter tip is in the superior vena cava. Left central catheter tip is at the junction of the  left innominate vein and superior vena cava, stable. Nasogastric tube tip and side port below the diaphragm. No pneumothorax. There is bibasilar atelectasis with small pleural effusion on the right. No consolidation. Heart is mildly enlarged with pulmonary vascularity normal. No adenopathy. No bone lesions. IMPRESSION: Tube and catheter positions as described without pneumothorax. Mild bibasilar atelectasis. Small right pleural effusion.  No consolidation. Stable cardiac prominence. Electronically Signed   By: Lowella Grip III M.D.   On: 04/29/2018 07:34   Dg Chest Port 1 View  Result Date: 04/28/2018 CLINICAL DATA:  ETT placement and HD catheter placement EXAM: PORTABLE CHEST 1 VIEW COMPARISON:  04/27/2018. FINDINGS: Cardiomegaly. HD catheter tip distal SVC. ET tube tip 3.3 cm above carina. BILATERAL pulmonary opacities, RIGHT worse than LEFT, consistent with pulmonary edema. Central venous catheter tip unchanged from LEFT IJ approach, proximal SVC. No pneumothorax. IMPRESSION: Cardiomegaly with pulmonary edema may be minimally worse. ET tube and HD catheter appear in satisfactory position. No pneumothorax. Electronically Signed   By: Staci Righter M.D.   On: 04/28/2018 16:57    Cardiac Studies   LV EF: 30% -   35%  ------------------------------------------------------------------- Indications:      Abnormal EKG 794.31.  ------------------------------------------------------------------- Study Conclusions  - Left ventricle: The cavity size was normal. There was mild   concentric hypertrophy. Systolic function was moderately to   severely reduced. The estimated ejection fraction was in the   range of 30% to 35%. Features are consistent with a pseudonormal   left ventricular filling pattern, with concomitant abnormal   relaxation and increased filling pressure (grade 2 diastolic   dysfunction). Doppler parameters are consistent with elevated   ventricular end-diastolic filling pressure. - Mitral valve: Calcified annulus. Moderately thickened, moderately   calcified leaflets . There was mild regurgitation. - Left atrium: The atrium was mildly dilated. - Right ventricle: The cavity size was moderately dilated. Wall   thickness was normal. Systolic function was moderately reduced. - Tricuspid valve: There was mild regurgitation. - Pulmonary arteries: Systolic pressure was mildly increased. PA   peak pressure: 34  mm Hg (S). - Pericardium, extracardiac: A trivial pericardial effusion was   identified. Features were not consistent with tamponade   physiology.  Impressions:  - When compared to the prior study from 04/17/2017 LVEF has   decreased from 55-60% to 30-35% with new wall motion   abnormalities - diffuse hypokinesis and akinesis of the basal and   mid anteroseptal, anterior and apical septal and anterior walls.   RVEF is moderately decreased.  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 Michalski has new EKG changes today including inferior and lateral TWI's. She remains intubated and sedated on the vent - difficult to say if this is new ischemia or not. Agree with checking troponin- if it is elevated compared to prior studies, would push for earlier cardiac catheterization as this may represent evolving ischemia.  Time Spent Directly with Patient:  I have spent a total of 25 minutes with the patient reviewing hospital notes, telemetry, EKGs, labs and examining the patient as well as establishing an assessment and plan that was discussed personally with the patient. > 50% of time was spent in direct patient care.  Length of Stay:  LOS: 5 days   Pixie Casino, MD, [facility], [place] Director of the [facility]  Card[facility] Diplomate of the American Board of Clinical  Lipidology Attending Cardiologist  Direct Dial: [phone]  Fax: [phone]  Website:  [email or web address] [email or web address] Bob [email or web address] 04/30/2018, 9:53 AM

## 2018-04-30 NOTE — Progress Notes (Signed)
[facility]  Subjective  - sedated on vent  Objective (!) 137/59 64 98.2 F (36.8 C) (Axillary) 15 99%  Intake/Output Summary (Last 24 hours) at 04/30/2018 7078 Last data filed at 04/30/2018 0800 Gross per 24 hour  Intake 1409.52 ml  Output 2162 ml  Net -752.48 ml   Left AKA clean healing minimal drainage Making some urine  Assessment/Planning: S/p AKA staple removal in one month Acute renal failure hopefully improve over next few days/weeks VDRF per critical care CAD per cardiology  Will follow peripherally.  Let us know if she needs permanent HD access  Ruta Hinds 04/30/2018 9:28 AM --  Laboratory Lab Results: Recent Labs    04/29/18 0320 04/30/18 0500  WBC 8.6 10.2  HGB 9.5* 9.1*  HCT 28.6* 28.0*  PLT 267 210   BMET Recent Labs    04/29/18 0320 04/30/18 0500  NA 133* 135  K 3.2* 3.1*  CL 93* 94*  CO2 26 26  GLUCOSE 103* 82  BUN 68* 50*  CREATININE 7.61* 6.38*  CALCIUM 7.0* 7.0*    COAG Lab Results  Component Value Date   INR 1.15 03/24/2018   INR 1.40 11/28/2017   INR 1.30 11/28/2017   No results found for: PTT

## 2018-04-30 NOTE — Progress Notes (Signed)
Admit: 04/24/2018 LOS: 20  58F with anuric AKI related to ATN from septic shock, ischemic/gangrenous LLL s/p L AKA  Subjective:  HD #1 yesterday afternoon, tolerated well but did appear to require small dose of vasopressor, 1.3 L ultrafiltration remains intubated, weaning today UOP only 0.2 L Off all pressors this morning   06/12 0701 - 06/13 0700 In: 1688.2 [I.V.:1518.2; NG/GT:60; IV Piggyback:100] Out: 2144 [Urine:202; Emesis/NG output:600]  Filed Weights   04/28/18 0500 04/29/18 0300 04/30/18 0230  Weight: 82.7 kg (182 lb 5.1 oz) 91.1 kg (200 lb 13.4 oz) 91.1 kg (200 lb 13.4 oz)    Scheduled Meds: . aspirin  81 mg Oral Daily  . chlorhexidine gluconate (MEDLINE KIT)  15 mL Mouth Rinse BID  . Chlorhexidine Gluconate Cloth  6 each Topical Daily  . insulin aspart  0-15 Units Subcutaneous Q4H  . mouth rinse  15 mL Mouth Rinse QID  . mupirocin ointment  1 application Nasal BID  . rosuvastatin  20 mg Oral q1800  . sodium chloride flush  10-40 mL Intracatheter Q12H   Continuous Infusions: . sodium chloride 10 mL/hr at 04/30/18 0400  . sodium chloride    . sodium chloride    . sodium chloride    . cefTAZidime (FORTAZ)  IV Stopped (04/29/18 1730)  . dexmedetomidine (PRECEDEX) IV infusion 0.3 mcg/kg/hr (04/30/18 0800)  . famotidine (PEPCID) IV 20 mg (04/30/18 0955)  . heparin 1,350 Units/hr (04/30/18 0450)  . norepinephrine (LEVOPHED) Adult infusion Stopped (04/29/18 1830)   PRN Meds:.sodium chloride, Place/Maintain arterial line **AND** sodium chloride, sodium chloride, sodium chloride, alteplase, docusate, fentaNYL (SUBLIMAZE) injection, fentaNYL (SUBLIMAZE) injection, heparin, morphine injection, nitroGLYCERIN, ondansetron (ZOFRAN) IV, sodium chloride flush  Current Labs: reviewed    Physical Exam:  Blood pressure 126/66, pulse 64, temperature 98.2 F (36.8 C), temperature source Axillary, resp. rate 14, height '5\' 7"'  (1.702 m), weight 91.1 kg (200 lb 13.4 oz), SpO2 100  %. GEN: Chronically ill-appearing, in mild distress that she cannot go outside, tearful ENT: NCAT, poor dentition EYES: EOMI CV: RRR, no rub, normal S1 and S2 PULM: CTAB anteriorly ABD: Soft, nontender SKIN: Left stump bandaged EXT: Right lower extremity without edema R IJ Temp HD cath present  A 1. Anuric AKI from ATN, normal baseline SCr 1. Started HD 04/29/18 2. No evidence for recovery 3. Has R IJ Temp HD cath [facility] 04/28/18 2. 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 Septic shock, improved but on NE still 4. DM2 5. NSTEMI, cardiology following new depressed LVEF and RWMA on 6/11 TTE 6. Hx/o AFib   P 1. HD tomorrow, separate:  4K, 3L UF, Qb 250, 3.5h, no  Bolus heparin   Pearson Grippe MD 04/30/2018, 11:06 AM  Recent Labs  Lab 04/25/18 0318  04/26/18 0404  04/28/18 0500 04/29/18 0320 04/30/18 0500  NA 132*   < > 134*   < > 132* 133* 135  K 4.0   < > 4.1   < > 3.7 3.2* 3.1*  CL 100*   < > 102   < > 97* 93* 94*  CO2 17*   < > 16*   < > 21* 26 26  GLUCOSE 169*   < > 136*   < > 131* 103* 82  BUN 57*   < > 56*   < > 62* 68* 50*  CREATININE 7.54*   < > 7.63*   < > 7.72* 7.61* 6.38*  CALCIUM 7.4*   < > 7.6*   < >  7.6* 7.0* 7.0*  PHOS 7.0*  --  7.9*  --   --   --   --    < > = values in this interval not displayed.   Recent Labs  Lab 04/24/18 2345  04/28/18 1100 04/29/18 0320 04/30/18 0500  WBC 19.6*   < > 11.7* 8.6 10.2  NEUTROABS 17.1*  --   --   --   --   HGB 7.3*   < > 9.0* 9.5* 9.1*  HCT 23.4*   < > 27.3* 28.6* 28.0*  MCV 75.7*   < > 75.0* 73.0* 75.7*  PLT 311   < > 284 267 210   < > = values in this interval not displayed.

## 2018-04-30 NOTE — Progress Notes (Signed)
Pt has been tolerating PSV 5/5 since early this morning.  Adequate tidal volumes (approx. 500), SBI 25-30.  However pt remains very drowsy, but will arouse and follow commands.  Discussed with Dr. Halford Chessman, will hold off on extubation today and will try again tomorrow after pt received HD.

## 2018-04-30 NOTE — Progress Notes (Signed)
CRITICAL VALUE ALERT  Critical Value:  Troponin 0.91  Date & Time Notied:  04/30/18 1658  Provider Notified: Yes  Orders Received/Actions taken: None at this time

## 2018-04-30 NOTE — Progress Notes (Signed)
PULMONARY / CRITICAL CARE MEDICINE   Name: [name] MRN: [id number] DOB: 03-11-1966    ADMISSION DATE:  04/24/2018 CONSULTATION DATE:  04/25/2018  REFERRING MD:  Dr. 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 Post-Op patient lost pulse to left foot and was taken back to OR for thrombectomy. On 6/3 patient went to outpatient vascular appointment where she was found to by hypotensive and febrile with a occlusion noted to bypass graft. Patient was told at this time to got to ED however, she was going on a camping trip in which she did not want to miss. Sent home with clindamycin.   On 6/7 patient presented to ED with AMS with pain to left lower leg and emesis and diarrhea. On arrival BP 71/48. LA 1.14. Given 3.5L NS without improvement, started on levophed gtt. Administered Vancomycin and Zosyn. LLE with no pulse and gangrenous. Vascular Consulted. Plans for BKA on Monday. PCCM asked to admit.  Patient states she took Xarelto last on 6/7.   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  Course has been complicated by AKI and NSTEMI.  SUBJECTIVE:  On PSV: 40%, 5/5 Afebrile On 0.3 mcg precedex, drowsy but arouses and follows commands No vasopressors On heparin gtt Telemetry with new ST depression and T wave inversion Received HD on 6/12 with approx. 1.5L removed  VITAL SIGNS: BP (!) 137/59   Pulse 64   Temp 98.2 F (36.8 C) (Axillary)   Resp 15   Ht 5\' 7"  (1.702 m)   Wt 200 lb 13.4 oz (91.1 kg)   SpO2 99%   BMI 31.46 kg/m   HEMODYNAMICS: CVP:  [7 mmHg] 7 mmHg  VENTILATOR SETTINGS: Vent Mode: PSV;CPAP FiO2 (%):  [40 %] 40 % Set Rate:  [18 bmp] 18 bmp Vt Set:  [490 mL] 490 mL PEEP:  [5 cmH20] 5 cmH20 Pressure Support:  [5 cmH20] 5  cmH20 Plateau Pressure:  [16 cmH20-18 cmH20] 18 cmH20  INTAKE / OUTPUT: I/O last 3 completed shifts: In: 3574.3 [I.V.:3374.3; Other:20; NG/GT:80; IV Piggyback:100] Out: 2511 [Urine:269; Emesis/NG output:900; Other:1342]  PHYSICAL EXAMINATION: General: Acutely ill-appearing female, laying in bed, intubated, currently on PSV, in no acute distress  Neuro: Sedated, arouses to voice, follows simple commands, right pupil 5 mm and left 3 mm (no change from previous) HEENT: Atraumatic, normocephalic, ETT in place, neck supple Cardiovascular: RRR, No M/R/GRRR Lungs: Clear breath sounds, diminished in bases, even , non-labored, PSV, no assessory muscle use Abdomen: Soft, non-tender, non-distended, BS+ x4 Musculoskeletal: LLE AKA, 1+ edema [name]  Skin: Dry, warm.  Left AKA wrapped in dressing, ulceration to right hip  LABS:  BMET Recent Labs  Lab 04/28/18 0500 04/29/18 0320 04/30/18 0500  NA 132* 133* 135  K 3.7 3.2* 3.1*  CL 97* 93* 94*  CO2 21* 26 26  BUN 62* 68* 50*  CREATININE 7.72* 7.61* 6.38*  GLUCOSE 131* 103* 82    Electrolytes Recent Labs  Lab 04/25/18 0318  04/26/18 0404  04/27/18 0435 04/28/18 0500 04/29/18 0320 04/30/18 0500  CALCIUM 7.4*   < > 7.6*   < > 8.0* 7.6* 7.0* 7.0*  MG 1.1*  --  1.1*  --  1.4* 1.5* 1.7  --  PHOS 7.0*  --  7.9*  --   --   --   --   --    < > = values in this interval not displayed.    CBC Recent Labs  Lab 04/28/18 1100 04/29/18 0320 04/30/18 0500  WBC 11.7* 8.6 10.2  HGB 9.0* 9.5* 9.1*  HCT 27.3* 28.6* 28.0*  PLT 284 267 210    Coag's Recent Labs  Lab 04/25/18 2215 04/27/18 1423 04/28/18 0500  APTT 47* 72* 96*    Sepsis Markers Recent Labs  Lab 04/25/18 0000  04/26/18 2108 04/27/18 0427 04/27/18 0435 04/28/18 0500 04/29/18 0320  LATICACIDVEN 1.14  --  0.7 0.8  --   --   --   PROCALCITON  --    < >  --   --  2.36 1.69 1.10   < > = values in this interval not displayed.    ABG Recent Labs  Lab  04/28/18 1756 04/29/18 0324  PHART 7.452* 7.454*  PCO2ART 39.1 37.8  PO2ART 178.0* 149.0*    Liver Enzymes Recent Labs  Lab 04/24/18 2345  AST 26  ALT 10*  ALKPHOS 131*  BILITOT 0.6  ALBUMIN 1.8*    Cardiac Enzymes Recent Labs  Lab 04/26/18 0404 04/26/18 2000 04/28/18 0500  TROPONINI 6.50* 2.37* 1.49*    Glucose Recent Labs  Lab 04/29/18 1130 04/29/18 1509 04/29/18 1918 04/29/18 2306 04/30/18 0358 04/30/18 0738  GLUCAP 93 79 77 96 73 78    Imaging Dg Chest Port 1 View  Result Date: 04/30/2018 CLINICAL DATA:  Intubation EXAM: PORTABLE CHEST 1 VIEW COMPARISON:  04/29/2018 FINDINGS: Endotracheal tube in good position. NG tube in the stomach. Central venous catheter tip in the lower SVC unchanged. No pneumothorax Cardiac enlargement. Diffuse bilateral airspace disease has progressed, probable edema. Progression of bibasilar atelectasis and small pleural effusions bilaterally. IMPRESSION: Support lines remain in good position Worsening bilateral airspace disease most consistent with pulmonary edema. Progression of bibasilar atelectasis and bilateral effusions. Electronically Signed   By: Franchot Gallo M.D.   On: 04/30/2018 07:16     STUDIES:  CXR 6/7 > Moderate cardiomegaly, stable. Mediastinal silhouette within normal Limits. No focal infiltrates. No pulmonary edema or visible pleural effusion. No pneumothorax. Osseous structures within normal limits ECHO 6/11>> When compared to the prior study from 04/17/2017 LVEF has decreased from 55-60% to 30-35% with new wall motion abnormalities - diffuse hypokinesis and akinesis of the basal and mid anteroseptal, anterior and apical septal and anterior walls.  RVEF is moderately decreased  CULTURES: Blood 6/7 >> UC 6/7 >>negative   ANTIBIOTICS: Vancomycin 6/7 >>6/11 Zosyn 6/7 >>6/11 Ceftazidime 6/11>>  SIGNIFICANT EVENTS: 6/7 > Presents to ED  6/9>> Emergent Left AKA per Vascular surgery 6/11>>Nephrology consulted  for worsening oliguric renal failure, and metabolic acidosis 0/62>>IRSWNIOEV  6/12>>1st run of HD 6/13>>Telemetry with new ST depression and T wave inversion  LINES/TUBES: PIV  R IJ Temporary HD cath 6/11>> ETT 6/11>>   DISCUSSION: 52 year old female presents in Septic Shock secondary to gangrenous left foot.  Subsequent development of oliguric AKI and elevated troponins.  Status post emergent left AKA on 6/9 per vascular surgery. On 6/11 has acute decompensation and severe AMS and confusion requiring intubation, temporary HD cath placed.  Plan for run of HD on 6/12.  ASSESSMENT / PLAN:  PULMONARY A:  Intubated due to AMS & severe confusion P: PRVC: 8 cc/kg Daily SBT VAP Bundle Pulmonary hygiene F/u ABG prn F/u CXR prn  CARDIAC  A: Septic Shock in setting of gangrenous left foot>>resolved -s/p L AKA on 6/9 Elevated troponin,  NSTEMI (possibly stress related in setting of hypotension) -Telemetry with new ST depression and T wave inversion on telemetry 6/13 Acute HFrEF (EF 30-35% on ECHO 6/11) in setting of NSTEMI -CXR with pulmonary edema H/O A.Fib on Xarelto, PAD  P:  Maintain MAP>65 Levophed as needed to maintain MAP goal, wean as tolerated Obtain STAT EKG 6/13 Cycle Troponin's 6/13 On Heparin gtt per pharmacy Cardiology following, appreciate input Per Cardiology, conservative management of NSTEMI, will need cardiac cath at some point Intermittent HD for volume removal  RENAL A: Acute Kidney Injury in setting of septic shock vs Rhabdo; oliguria Non-anion gap metabolic acidosis>>resolved Mild rhabdomyolysis, CK 1049>>resolved Hypokalemia Crt 8.12 >> 7.41>>7.79 CK>>1049>>221 LA 1.14  Renal ultrasound 6/8 normal P:   Nephrology following, appreciate input Intermittent HD per Nephrology, plan for HD again 6/14 per Nephrology Monitor I&O's / urine output Trend BMP Replace electrolytes as indicated Will replete K 6/13 (40 mEq po once per tube), f/u serum K in  afternoon  GI A: GERD Nausea  P:   NPO for now Hopeful for extubation today 6/13, if unable to extubate with consult dietician for TF Zofran prn Pepcid for SUP  HEMATOLOGIC A: Anemia  Iron deficiency P:  Continue Heparin gtt per pharmacy for anticoagulation (NSTEMI, hx of A-fib) Monitor for s/sx of bleeding Follow CBC Transfuse for Hgb<7  INFECTIOUS A: Gangrenous Left Leg  -WBC 8.6 (previously 21.6 on 6/9), Afebrile (previously febrile), PCT 1.1 (peaked 2.88)  -S/p Left AKA 6/9  P:   Monitor fever curve Trend WBC's Abx as above Follow blood cultures Trend PCT  ENDOCRINE A: DM   P:   CBG's SSI Follow ICU hypo/hyperglycemia protocol  NEURO A: Sedation needs for mechanical ventilation Acute encephalopathy in setting of sepsis and AKI P: RASS goal: 0 to -1 Daily WUA Precedex gtt to maintain RASS goal Avoid sedating medications as able Provide supportive care Lights on during the day Intermittent HD per Nephrology   FAMILY  - Updates: No family present at bedside 6/13 in am.  - Inter-disciplinary family meet or Palliative Care meeting due by: 05/02/2018    Darel Hong, AGACNP-BC [facility] 04/30/2018, 8:14 AM

## 2018-04-30 NOTE — Progress Notes (Signed)
Nutrition Follow-up / Consult  DOCUMENTATION CODES:   Not applicable  INTERVENTION:   Vital High Protein @ 65 ml/hr (1560 ml/day)  Provides: 1560 kcals, 137 grams protein, 1304 ml free water. Meets 100% of needs  NUTRITION DIAGNOSIS:   Increased nutrient needs related to wound healing as evidenced by estimated needs.  Ongoing   GOAL:   Patient will meet greater than or equal to 90% of their needs  Will be met with TF  MONITOR:   Diet advancement, Vent status, Labs, Weight trends, TF tolerance, Skin, I & O's  REASON FOR ASSESSMENT:   Consult Enteral/tube feeding initiation and management  ASSESSMENT:   Patient with PMH significant for DM, diabetic neuropathy, HTN, PAD, and left femoropopliteal bypass graft occlusion. Recently admitted 5/10-5/14 for redo of Left femoral to PTA bypass with cryopreserved vein. Pt presents this admission with septic shock and ischemic/dgangrenous left lower extremity after failed attempt at revascularization.   Patient remains intubated on ventilator support. Unable to extubate today. Received MD Consult for TF initiation and management. Temp (24hrs), Avg:98.3 F (36.8 C), Min:97.3 F (36.3 C), Max:98.8 F (37.1 C)  Propofol: none Labs and medications reviewed.   Diet Order:   Diet Order           Diet NPO time specified  Diet effective now          EDUCATION NEEDS:   Not appropriate for education at this time  Skin:  Skin Assessment: Skin Integrity Issues: Skin Integrity Issues:: Stage III, Incisions Stage III: buttocks Incisions: left leg s/p BKA  Last BM:  6/13 (type 6)  Height:   Ht Readings from Last 1 Encounters:  04/24/18 '5\' 7"'  (1.702 m)    Weight:   Wt Readings from Last 1 Encounters:  04/30/18 200 lb 13.4 oz (91.1 kg)   6/11  182 lbs 6/9     185 lbs 6/8  173 lbs  Ideal Body Weight:  57.1 kg(adjusted L BKA)  Estimated Nutritional Needs:   Kcal:  1531 kcal  Protein:  118- 157 g (1.5-2  g/kg)  Fluid:  1000 ml + UOP    Molli Barrows, RD, LDN, [place] Pager [phone] After Hours Pager [phone]

## 2018-04-30 NOTE — Progress Notes (Signed)
ANTICOAGULATION CONSULT NOTE   Pharmacy Consult for Heparin  Indication: atrial fibrillation  No Known Allergies  Patient Measurements: Height: 5\' 7"  (170.2 cm) Weight: 200 lb 13.4 oz (91.1 kg) IBW/kg (Calculated) : 61.6  Vital Signs: Temp: 98.2 F (36.8 C) (06/13 0740) Temp Source: Axillary (06/13 0740) BP: 137/59 (06/13 0715) Pulse Rate: 64 (06/13 0717)  Labs: Recent Labs    04/27/18 1423 04/28/18 0500  04/28/18 1100 04/29/18 0320 04/29/18 1135 04/29/18 2044 04/30/18 0500  HGB  --   --    < > 9.0* 9.5*  --   --  9.1*  HCT  --   --   --  27.3* 28.6*  --   --  28.0*  PLT  --   --   --  284 267  --   --  210  APTT 72* 96*  --   --   --   --   --   --   HEPARINUNFRC 0.28* 0.33  --   --  0.17* 0.28* 0.93* 0.35  CREATININE  --  7.72*  --   --  7.61*  --   --  6.38*  CKTOTAL  --  78  --   --   --   --   --   --   TROPONINI  --  1.49*  --   --   --   --   --   --    < > = values in this interval not displayed.    Assessment: 52 y/o F on Xarelto PTA for afib, was transitioned to heparin while inpatient and peri-operatively. 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 Heparin was resumed s/p procedure and now s/p temp HD cath  Unfortunately, the patient suffered a NSTEMI peri-operatively; therefore, heparin is being continued for dual indications. Appears that the plan is to treat the NSTEMI with medical management for the time being. Heparin level is now therapeutic  Goal of Therapy:  Heparin level 0.3-0.7 units/ml aPTT 66-102 seconds Monitor platelets by anticoagulation protocol: Yes   Plan:  Continue heparin gtt 1350 units/hr Daily heparin level and CBC  Thanks for allowing pharmacy to be a part of this patient's care.  Salome Arnt, PharmD, BCPS Phone #: [phone] until 3pm All other times, call [place] x [phone] 04/30/2018 8:26 AM

## 2018-04-30 NOTE — Progress Notes (Signed)
Pharmacy Antibiotic Note  [name] is a 52 y.o. female admitted on 04/24/2018 with sepsis.  Pharmacy has been consulted for vancomycin dosing. Pt is afebrile and WBC is WNL. Pt received 2.5 hours of HD yesterday. Antibiotics to stop tomorrow. A random vancomycin level today is at goal at 24. She will not require further vancomycin.    Plan: No further vancomycin today F/u HD plans, C&S, clinical status  Height: 5\' 7"  (170.2 cm) Weight: 200 lb 13.4 oz (91.1 kg) IBW/kg (Calculated) : 61.6  Temp (24hrs), Avg:98.4 F (36.9 C), Min:97.9 F (36.6 C), Max:98.8 F (37.1 C)  Recent Labs  Lab 04/25/18 0000  04/26/18 0404 04/26/18 2000 04/26/18 2108 04/27/18 0427 04/27/18 0435 04/28/18 0500 04/28/18 1100 04/29/18 0320 04/29/18 1135 04/30/18 0500  WBC  --    < > 21.6*  --   --   --  12.3*  --  11.7* 8.6  --  10.2  CREATININE  --    < > 7.63* 7.80*  --   --  7.79* 7.72*  --  7.61*  --  6.38*  LATICACIDVEN 1.14  --   --   --  0.7 0.8  --   --   --   --   --   --   VANCORANDOM  --   --  12  --   --   --   --   --   --   --  34  --    < > = values in this interval not displayed.    Estimated Creatinine Clearance: 11.7 mL/min (A) (by C-G formula based on SCr of 6.38 mg/dL (H)).    No Known Allergies    Thank you for allowing pharmacy to be a part of this patient's care.  Tray Klayman, Rande Lawman 04/30/2018 8:27 AM

## 2018-05-01 ENCOUNTER — Inpatient Hospital Stay (HOSPITAL_COMMUNITY): Payer: Medicare Other

## 2018-05-01 LAB — GLUCOSE, CAPILLARY
GLUCOSE-CAPILLARY: 138 mg/dL — AB (ref 65–99)
GLUCOSE-CAPILLARY: 123 mg/dL — AB (ref 65–99)
GLUCOSE-CAPILLARY: 148 mg/dL — AB (ref 65–99)
GLUCOSE-CAPILLARY: 136 mg/dL — AB (ref 65–99)
Glucose-Capillary: 157 mg/dL — ABNORMAL HIGH (ref 65–99)
Glucose-Capillary: 109 mg/dL — ABNORMAL HIGH (ref 65–99)
Glucose-Capillary: 83 mg/dL (ref 65–99)

## 2018-05-01 LAB — BASIC METABOLIC PANEL
ANION GAP: 9 (ref 5–15)
BUN: 57 mg/dL — AB (ref 6–20)
CALCIUM: 7.4 mg/dL — AB (ref 8.9–10.3)
CO2: 27 mmol/L (ref 22–32)
Chloride: 98 mmol/L — ABNORMAL LOW (ref 101–111)
Creatinine, Ser: 7.04 mg/dL — ABNORMAL HIGH (ref 0.44–1.00)
GFR calc Af Amer: 7 mL/min — ABNORMAL LOW (ref >60)
GFR, EST NON AFRICAN AMERICAN: 6 mL/min — AB (ref >60)
GLUCOSE: 129 mg/dL — AB (ref 65–99)
POTASSIUM: 3.3 mmol/L — AB (ref 3.5–5.1)
Sodium: 134 mmol/L — ABNORMAL LOW (ref 135–145)

## 2018-05-01 LAB — CBC
HEMATOCRIT: 25.7 % — AB (ref 36.0–46.0)
HEMOGLOBIN: 8.3 g/dL — AB (ref 12.0–15.0)
MCH: 24.6 pg — AB (ref 26.0–34.0)
MCHC: 32.3 g/dL (ref 30.0–36.0)
MCV: 76 fL — ABNORMAL LOW (ref 78.0–100.0)
Platelets: 181 10*3/uL (ref 150–400)
RBC: 3.38 MIL/uL — ABNORMAL LOW (ref 3.87–5.11)
RDW: 17.3 % — AB (ref 11.5–15.5)
WBC: 11.4 10*3/uL — ABNORMAL HIGH (ref 4.0–10.5)

## 2018-05-01 LAB — HEPARIN LEVEL (UNFRACTIONATED)
HEPARIN UNFRACTIONATED: 0.15 [IU]/mL — AB (ref 0.30–0.70)
Heparin Unfractionated: 0.25 IU/mL — ABNORMAL LOW (ref 0.30–0.70)

## 2018-05-01 LAB — PROCALCITONIN: Procalcitonin: 0.66 ng/mL

## 2018-05-01 MED ORDER — MORPHINE SULFATE (PF) 2 MG/ML IV SOLN
1.0000 mg | Freq: Once | INTRAVENOUS | Status: AC
  Administered 2018-05-01: 1 mg via INTRAVENOUS
  Filled 2018-05-01: qty 1

## 2018-05-01 MED ORDER — MORPHINE SULFATE (PF) 2 MG/ML IV SOLN
1.0000 mg | INTRAVENOUS | Status: DC | PRN
  Administered 2018-05-01: 2 mg via INTRAVENOUS
  Administered 2018-05-02: 1 mg via INTRAVENOUS
  Filled 2018-05-01 (×2): qty 1

## 2018-05-01 MED ORDER — METOPROLOL TARTRATE 5 MG/5ML IV SOLN
2.5000 mg | Freq: Four times a day (QID) | INTRAVENOUS | Status: DC | PRN
  Administered 2018-05-01: 2.5 mg via INTRAVENOUS
  Filled 2018-05-01: qty 5

## 2018-05-01 MED ORDER — CHLORHEXIDINE GLUCONATE 0.12 % MT SOLN
15.0000 mL | Freq: Two times a day (BID) | OROMUCOSAL | Status: DC
  Administered 2018-05-01 – 2018-05-20 (×34): 15 mL via OROMUCOSAL
  Filled 2018-05-01 (×34): qty 15

## 2018-05-01 MED ORDER — MIDODRINE HCL 5 MG PO TABS
10.0000 mg | ORAL_TABLET | Freq: Once | ORAL | Status: AC
  Administered 2018-05-01: 10 mg via ORAL
  Filled 2018-05-01: qty 2

## 2018-05-01 MED ORDER — ORAL CARE MOUTH RINSE
15.0000 mL | Freq: Two times a day (BID) | OROMUCOSAL | Status: DC
  Administered 2018-05-02 – 2018-05-20 (×28): 15 mL via OROMUCOSAL

## 2018-05-01 NOTE — Progress Notes (Signed)
ANTICOAGULATION CONSULT NOTE   Pharmacy Consult for Heparin  Indication: atrial fibrillation  No Known Allergies  Patient Measurements: Height: 5\' 7"  (170.2 cm) Weight: 201 lb 4.5 oz (91.3 kg) IBW/kg (Calculated) : 61.6  Vital Signs: Temp: 99.5 F (37.5 C) (06/14 0350) Temp Source: Oral (06/14 0350) BP: 119/69 (06/14 0600) Pulse Rate: 61 (06/14 0600)  Labs: Recent Labs    04/29/18 0320  04/29/18 2044 04/30/18 0500 04/30/18 0952 04/30/18 1440 04/30/18 2045 05/01/18 0500  HGB 9.5*  --   --  9.1*  --   --   --  8.3*  HCT 28.6*  --   --  28.0*  --   --   --  25.7*  PLT 267  --   --  210  --   --   --  181  HEPARINUNFRC 0.17*   < > 0.93* 0.35  --   --   --  0.15*  CREATININE 7.61*  --   --  6.38*  --   --   --  7.04*  TROPONINI  --   --   --   --  1.14* 0.91* 0.68*  --    < > = values in this interval not displayed.    Assessment: 52 y/o F on Xarelto PTA for afib, was transitioned to heparin while inpatient and peri-operatively. 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 Heparin was resumed s/p procedure and now s/p temp HD cath  Unfortunately, the patient suffered a NSTEMI peri-operatively; therefore, heparin is being continued for dual indications. Appears that the plan is to treat the NSTEMI with medical management for the time being.  Heparin level 0.15 units/ml.  No issues with infusion.  Goal of Therapy:  Heparin level 0.3-0.7 units/ml aPTT 66-102 seconds Monitor platelets by anticoagulation protocol: Yes   Plan:  Increase heparin gtt to 1450 units/hr Check heparin level later today Daily heparin level and CBC  Thanks for allowing pharmacy to be a part of this patient's care.  Excell Seltzer, PharmD

## 2018-05-01 NOTE — Procedures (Signed)
I was present at this dialysis session. I have reviewed the session itself and made appropriate changes.  4K bath.  Tol SBT yest, possible extubation later today.  4m midodrine with HD today for BP support.  Goal 3L UF, Systemic heparin.   Filed Weights   04/29/18 0300 04/30/18 0230 05/01/18 0400  Weight: 91.1 kg (200 lb 13.4 oz) 91.1 kg (200 lb 13.4 oz) 91.3 kg (201 lb 4.5 oz)    Recent Labs  Lab 04/26/18 0404  05/01/18 0500  NA 134*   < > 134*  K 4.1   < > 3.3*  CL 102   < > 98*  CO2 16*   < > 27  GLUCOSE 136*   < > 129*  BUN 56*   < > 57*  CREATININE 7.63*   < > 7.04*  CALCIUM 7.6*   < > 7.4*  PHOS 7.9*  --   --    < > = values in this interval not displayed.    Recent Labs  Lab 04/24/18 2345  04/29/18 0320 04/30/18 0500 05/01/18 0500  WBC 19.6*   < > 8.6 10.2 11.4*  NEUTROABS 17.1*  --   --   --   --   HGB 7.3*   < > 9.5* 9.1* 8.3*  HCT 23.4*   < > 28.6* 28.0* 25.7*  MCV 75.7*   < > 73.0* 75.7* 76.0*  PLT 311   < > 267 210 181   < > = values in this interval not displayed.    Scheduled Meds: . aspirin  81 mg Oral Daily  . chlorhexidine gluconate (MEDLINE KIT)  15 mL Mouth Rinse BID  . Chlorhexidine Gluconate Cloth  6 each Topical Daily  . Chlorhexidine Gluconate Cloth  6 each Topical Q0600  . insulin aspart  0-15 Units Subcutaneous Q4H  . mouth rinse  15 mL Mouth Rinse QID  . midodrine  10 mg Oral Once in dialysis  . rosuvastatin  20 mg Oral q1800  . sodium chloride flush  10-40 mL Intracatheter Q12H   Continuous Infusions: . sodium chloride 10 mL/hr at 05/01/18 0400  . sodium chloride    . sodium chloride    . sodium chloride    . cefTAZidime (FORTAZ)  IV Stopped (04/30/18 1429)  . dexmedetomidine (PRECEDEX) IV infusion Stopped (05/01/18 0655)  . famotidine (PEPCID) IV Stopped (04/30/18 1035)  . feeding supplement (VITAL HIGH PROTEIN) 65 mL/hr at 05/01/18 0400  . heparin 1,450 Units/hr (05/01/18 0636)  . norepinephrine (LEVOPHED) Adult infusion  Stopped (04/29/18 1830)   PRN Meds:.sodium chloride, Place/Maintain arterial line **AND** sodium chloride, sodium chloride, sodium chloride, alteplase, docusate, fentaNYL (SUBLIMAZE) injection, heparin, morphine injection, nitroGLYCERIN, ondansetron (ZOFRAN) IV, sodium chloride flush   RPearson Grippe MD 05/01/2018, 7:59 AM

## 2018-05-01 NOTE — Progress Notes (Signed)
ANTICOAGULATION CONSULT NOTE   Pharmacy Consult for Heparin  Indication: atrial fibrillation  No Known Allergies  Patient Measurements: Height: 5\' 7"  (170.2 cm) Weight: 196 lb 13.9 oz (89.3 kg) IBW/kg (Calculated) : 61.6  Vital Signs: Temp: 98.5 F (36.9 C) (06/14 1507) Temp Source: Oral (06/14 1507) BP: 107/72 (06/14 1600) Pulse Rate: 130 (06/14 1600)  Labs: Recent Labs    04/29/18 0320  04/30/18 0500 04/30/18 0952 04/30/18 1440 04/30/18 2045 05/01/18 0500 05/01/18 1532  HGB 9.5*  --  9.1*  --   --   --  8.3*  --   HCT 28.6*  --  28.0*  --   --   --  25.7*  --   PLT 267  --  210  --   --   --  181  --   HEPARINUNFRC 0.17*   < > 0.35  --   --   --  0.15* 0.25*  CREATININE 7.61*  --  6.38*  --   --   --  7.04*  --   TROPONINI  --   --   --  1.14* 0.91* 0.68*  --   --    < > = values in this interval not displayed.    Assessment: 52 y/o F on Xarelto PTA for afib, was transitioned to heparin while inpatient and peri-operatively. 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 Heparin was resumed s/p procedure and now s/p temp HD cath  Unfortunately, the patient suffered a NSTEMI peri-operatively; therefore, heparin is being continued for dual indications. Appears that the plan is to treat the NSTEMI with medical management for the time being.   Heparin up a little with rate increase but still low at 0.25.  Goal of Therapy:  Heparin level 0.3-0.7 units/ml aPTT 66-102 seconds Monitor platelets by anticoagulation protocol: Yes   Plan:  Increase heparin gtt to 1,550 units/hr Check heparin level tonight Daily heparin level and CBC  Elenor Quinones, PharmD, BCPS Clinical Pharmacist Phone number [phone] 05/01/2018 4:32 PM

## 2018-05-01 NOTE — Progress Notes (Signed)
DAILY PROGRESS NOTE   Patient Name: [name] Date of Encounter: 05/01/2018  Chief Complaint   Intubated, sedated on vent  Patient Profile   52 yo female with DM2, PAD, PAF and recent re-do left femoral to PTA bypass, presented with hypotension/sepsis and found to have elevated troponin c/w NSTEMI.  Subjective   Undergoing dialysis at the bedside today. Troponin continues to decrease - T wave inversions likely due to evolving infarct.  Objective   Vitals:   05/01/18 1101 05/01/18 1110 05/01/18 1115 05/01/18 1135  BP: (!) 118/47  120/69 138/70  Pulse: 75  79 (!) 114  Resp: 19  18 (!) 21  Temp:  98.4 F (36.9 C)  98.1 F (36.7 C)  TempSrc:  Oral  Oral  SpO2: 96%  97% 98%  Weight:      Height:        Intake/Output Summary (Last 24 hours) at 05/01/2018 1148 Last data filed at 05/01/2018 1124 Gross per 24 hour  Intake 1925.12 ml  Output 3664 ml  Net -1738.88 ml   Filed Weights   04/30/18 0230 05/01/18 0400 05/01/18 0730  Weight: 200 lb 13.4 oz (91.1 kg) 201 lb 4.5 oz (91.3 kg) 203 lb 7.8 oz (92.3 kg)    Physical Exam   General appearance: intubated, sedated on vent, on dialysis Neck: no carotid bruit, no JVD and thyroid not enlarged, symmetric, no tenderness/mass/nodules Lungs: diminished breath sounds bibasilar Heart: regular rate and rhythm Abdomen: soft, non-tender; bowel sounds normal; no masses,  no organomegaly Extremities: poor distal pulses, post-surgical wounds, open sores Pulses: weak pulses Skin: pale, warm, dry Neurologic: Mental status: intubated, sedated on vent, not following commands Psych: Cannot assess  Inpatient Medications    Scheduled Meds: . aspirin  81 mg Oral Daily  . chlorhexidine gluconate (MEDLINE KIT)  15 mL Mouth Rinse BID  . Chlorhexidine Gluconate Cloth  6 each Topical Daily  . Chlorhexidine Gluconate Cloth  6 each Topical Q0600  . insulin aspart  0-15 Units Subcutaneous Q4H  . mouth rinse  15 mL Mouth Rinse QID  .  rosuvastatin  20 mg Oral q1800  . sodium chloride flush  10-40 mL Intracatheter Q12H    Continuous Infusions: . sodium chloride 10 mL/hr at 05/01/18 0400  . sodium chloride    . sodium chloride    . sodium chloride    . cefTAZidime (FORTAZ)  IV Stopped (04/30/18 1429)  . dexmedetomidine (PRECEDEX) IV infusion Stopped (05/01/18 0655)  . famotidine (PEPCID) IV Stopped (05/01/18 1115)  . feeding supplement (VITAL HIGH PROTEIN) 1,000 mL (05/01/18 0858)  . heparin 1,450 Units/hr (05/01/18 0636)  . norepinephrine (LEVOPHED) Adult infusion Stopped (04/29/18 1830)    PRN Meds: sodium chloride, Place/Maintain arterial line **AND** sodium chloride, sodium chloride, sodium chloride, alteplase, docusate, fentaNYL (SUBLIMAZE) injection, heparin, morphine injection, nitroGLYCERIN, ondansetron (ZOFRAN) IV, sodium chloride flush   Labs   Results for orders placed or performed during the hospital encounter of 04/24/18 (from the past 48 hour(s))  Glucose, capillary     Status: None   Collection Time: 04/29/18  3:09 PM  Result Value Ref Range   Glucose-Capillary 79 65 - 99 mg/dL   Comment 1 Capillary Specimen    Comment 2 Notify RN   Hepatitis B surface antigen     Status: None   Collection Time: 04/29/18  4:05 PM  Result Value Ref Range   Hepatitis B Surface Ag Negative Negative    Comment: (NOTE) Performed At: [facility]  [place], [place] [id number] Rush Farmer MD [id number] Performed at [facility], [place] [address], [place], [place] [address]   Hepatitis B surface antibody     Status: None   Collection Time: 04/29/18  4:05 PM  Result Value Ref Range   Hep B S Ab Non Reactive     Comment: (NOTE)              Non Reactive: Inconsistent with immunity,                            less than 10 mIU/mL              Reactive:     Consistent with immunity,                            greater than 9.9 mIU/mL Performed At: [facility] [place], [place] [id number] Rush Farmer MD [id number] Performed at [facility], [place] [address] [place], [place] [address]   Glucose, capillary     Status: None   Collection Time: 04/29/18  7:18 PM  Result Value Ref Range   Glucose-Capillary 77 65 - 99 mg/dL   Comment 1 Capillary Specimen    Comment 2 Notify RN   Heparin level (unfractionated)     Status: Abnormal   Collection Time: 04/29/18  8:44 PM  Result Value Ref Range   Heparin Unfractionated 0.93 (H) 0.30 - 0.70 IU/mL    Comment: (NOTE) If heparin results are below expected values, and patient dosage has  been confirmed, suggest follow up testing of antithrombin III levels. Performed at [facility], [place] [name] [place], [place] [address]   Glucose, capillary     Status: None   Collection Time: 04/29/18 11:06 PM  Result Value Ref Range   Glucose-Capillary 96 65 - 99 mg/dL   Comment 1 Capillary Specimen    Comment 2 Notify RN   Glucose, capillary     Status: None   Collection Time: 04/30/18  3:58 AM  Result Value Ref Range   Glucose-Capillary 73 65 - 99 mg/dL   Comment 1 Capillary Specimen    Comment 2 Notify RN   Heparin level (unfractionated)     Status: None   Collection Time: 04/30/18  5:00 AM  Result Value Ref Range   Heparin Unfractionated 0.35 0.30 - 0.70 IU/mL    Comment: (NOTE) If heparin results are below expected values, and patient dosage has  been confirmed, suggest follow up testing of antithrombin III levels. Performed at [facility], [place] [address], [place], [place] [address]   CBC     Status: Abnormal   Collection Time: 04/30/18  5:00 AM  Result Value Ref Range   WBC 10.2 4.0 - 10.5 K/uL   RBC 3.70 (L) 3.87 - 5.11 MIL/uL   Hemoglobin 9.1 (L) 12.0 - 15.0 g/dL   HCT 28.0 (L) 36.0 - 46.0 %   MCV 75.7 (L) 78.0 - 100.0 fL   MCH 24.6 (L) 26.0 - 34.0 pg   MCHC 32.5 30.0 - 36.0 g/dL   RDW 17.4 (H) 11.5 - 15.5 %   Platelets 210 150 - 400 K/uL    Comment: Performed at  [facility], [place] [address], [place], [place] [address]  Basic metabolic panel     Status: Abnormal  Collection Time: 04/30/18  5:00 AM  Result Value Ref Range   Sodium 135 135 - 145 mmol/L   Potassium 3.1 (L) 3.5 - 5.1 mmol/L   Chloride 94 (L) 101 - 111 mmol/L   CO2 26 22 - 32 mmol/L   Glucose, Bld 82 65 - 99 mg/dL   BUN 50 (H) 6 - 20 mg/dL   Creatinine, Ser 6.38 (H) 0.44 - 1.00 mg/dL   Calcium 7.0 (L) 8.9 - 10.3 mg/dL   GFR calc non Af Amer 7 (L) >60 mL/min   GFR calc Af Amer 8 (L) >60 mL/min    Comment: (NOTE) The eGFR has been calculated using the CKD EPI equation. This calculation has not been validated in all clinical situations. eGFR's persistently <60 mL/min signify possible Chronic Kidney Disease.    Anion gap 15 5 - 15    Comment: Performed at [facility] [address] [place], [place] [address]  Glucose, capillary     Status: None   Collection Time: 04/30/18  7:38 AM  Result Value Ref Range   Glucose-Capillary 78 65 - 99 mg/dL   Comment 1 Notify RN   Vancomycin, random     Status: None   Collection Time: 04/30/18  9:52 AM  Result Value Ref Range   Vancomycin Rm 24     Comment:        Random Vancomycin therapeutic range is dependent on dosage and time of specimen collection. A peak range is 20.0-40.0 ug/mL A trough range is 5.0-15.0 ug/mL        Performed at [facility] [address] [place], [place] [address]   Troponin I (q 6hr x 3)     Status: Abnormal   Collection Time: 04/30/18  9:52 AM  Result Value Ref Range   Troponin I 1.14 (HH) <0.03 ng/mL    Comment: CRITICAL VALUE NOTED.  VALUE IS CONSISTENT WITH PREVIOUSLY REPORTED AND CALLED VALUE. Performed at [facility], [place] [address] [place], [place] [address]   Procalcitonin - Baseline     Status: None   Collection Time: 04/30/18  9:52 AM  Result Value Ref Range   Procalcitonin 0.75 ng/mL    Comment:        Interpretation: PCT > 0.5 ng/mL and <= 2 ng/mL: Systemic  infection (sepsis) is possible, but other conditions are known to elevate PCT as well. (NOTE)       Sepsis PCT Algorithm           Lower Respiratory Tract                                      Infection PCT Algorithm    ----------------------------     ----------------------------         PCT < 0.25 ng/mL                PCT < 0.10 ng/mL         Strongly encourage             Strongly discourage   discontinuation of antibiotics    initiation of antibiotics    ----------------------------     -----------------------------       PCT 0.25 - 0.50 ng/mL            PCT 0.10 - 0.25 ng/mL  OR       >80% decrease in PCT            Discourage initiation of                                            antibiotics      Encourage discontinuation           of antibiotics    ----------------------------     -----------------------------         PCT >= 0.50 ng/mL              PCT 0.26 - 0.50 ng/mL                AND       <80% decrease in PCT             Encourage initiation of                                             antibiotics       Encourage continuation           of antibiotics    ----------------------------     -----------------------------        PCT >= 0.50 ng/mL                  PCT > 0.50 ng/mL               AND         increase in PCT                  Strongly encourage                                      initiation of antibiotics    Strongly encourage escalation           of antibiotics                                     -----------------------------                                           PCT <= 0.25 ng/mL                                                 OR                                        > 80% decrease in PCT                                     Discontinue / Do not initiate  antibiotics Performed at [facility], [place] [address], [place], [place] [address]   Glucose, capillary     Status: None   Collection  Time: 04/30/18 11:56 AM  Result Value Ref Range   Glucose-Capillary 79 65 - 99 mg/dL   Comment 1 Notify RN   Potassium     Status: None   Collection Time: 04/30/18  2:40 PM  Result Value Ref Range   Potassium 3.7 3.5 - 5.1 mmol/L    Comment: Performed at [facility], [place] [address], [place], [place] [address]  Troponin I (q 6hr x 3)     Status: Abnormal   Collection Time: 04/30/18  2:40 PM  Result Value Ref Range   Troponin I 0.91 (HH) <0.03 ng/mL    Comment: CRITICAL VALUE NOTED.  VALUE IS CONSISTENT WITH PREVIOUSLY REPORTED AND CALLED VALUE. Performed at [facility], [place] [address] [place], [place] [address]   Glucose, capillary     Status: None   Collection Time: 04/30/18  3:45 PM  Result Value Ref Range   Glucose-Capillary 73 65 - 99 mg/dL   Comment 1 Notify RN   Glucose, capillary     Status: Abnormal   Collection Time: 04/30/18  7:25 PM  Result Value Ref Range   Glucose-Capillary 105 (H) 65 - 99 mg/dL   Comment 1 Capillary Specimen    Comment 2 Notify RN   Troponin I (q 6hr x 3)     Status: Abnormal   Collection Time: 04/30/18  8:45 PM  Result Value Ref Range   Troponin I 0.68 (HH) <0.03 ng/mL    Comment: CRITICAL VALUE NOTED.  VALUE IS CONSISTENT WITH PREVIOUSLY REPORTED AND CALLED VALUE. Performed at [facility], [place] [address], [place], [place] [address]   Glucose, capillary     Status: Abnormal   Collection Time: 04/30/18 11:17 PM  Result Value Ref Range   Glucose-Capillary 138 (H) 65 - 99 mg/dL   Comment 1 Capillary Specimen    Comment 2 Notify RN   Glucose, capillary     Status: Abnormal   Collection Time: 05/01/18  3:48 AM  Result Value Ref Range   Glucose-Capillary 123 (H) 65 - 99 mg/dL   Comment 1 Capillary Specimen    Comment 2 Notify RN   Heparin level (unfractionated)     Status: Abnormal   Collection Time: 05/01/18  5:00 AM  Result Value Ref Range   Heparin Unfractionated 0.15 (L) 0.30 - 0.70 IU/mL    Comment: (NOTE) If  heparin results are below expected values, and patient dosage has  been confirmed, suggest follow up testing of antithrombin III levels. Performed at [facility], [place] [address] [place], [place] [address]   Basic metabolic panel     Status: Abnormal   Collection Time: 05/01/18  5:00 AM  Result Value Ref Range   Sodium 134 (L) 135 - 145 mmol/L   Potassium 3.3 (L) 3.5 - 5.1 mmol/L   Chloride 98 (L) 101 - 111 mmol/L   CO2 27 22 - 32 mmol/L   Glucose, Bld 129 (H) 65 - 99 mg/dL   BUN 57 (H) 6 - 20 mg/dL   Creatinine, Ser 7.04 (H) 0.44 - 1.00 mg/dL   Calcium 7.4 (L) 8.9 - 10.3 mg/dL   GFR calc non Af Amer 6 (L) >60 mL/min   GFR calc Af Amer 7 (L) >60 mL/min    Comment: (NOTE) The eGFR has been  calculated using the CKD EPI equation. This calculation has not been validated in all clinical situations. eGFR's persistently <60 mL/min signify possible Chronic Kidney Disease.    Anion gap 9 5 - 15    Comment: Performed at [facility] [address] [place], [place] [address]  CBC     Status: Abnormal   Collection Time: 05/01/18  5:00 AM  Result Value Ref Range   WBC 11.4 (H) 4.0 - 10.5 K/uL   RBC 3.38 (L) 3.87 - 5.11 MIL/uL   Hemoglobin 8.3 (L) 12.0 - 15.0 g/dL   HCT 25.7 (L) 36.0 - 46.0 %   MCV 76.0 (L) 78.0 - 100.0 fL   MCH 24.6 (L) 26.0 - 34.0 pg   MCHC 32.3 30.0 - 36.0 g/dL   RDW 17.3 (H) 11.5 - 15.5 %   Platelets 181 150 - 400 K/uL    Comment: Performed at [facility], [place] [address] [place], [place] [address]  Procalcitonin     Status: None   Collection Time: 05/01/18  5:00 AM  Result Value Ref Range   Procalcitonin 0.66 ng/mL    Comment:        Interpretation: PCT > 0.5 ng/mL and <= 2 ng/mL: Systemic infection (sepsis) is possible, but other conditions are known to elevate PCT as well. (NOTE)       Sepsis PCT Algorithm           Lower Respiratory Tract                                      Infection PCT Algorithm    ----------------------------      ----------------------------         PCT < 0.25 ng/mL                PCT < 0.10 ng/mL         Strongly encourage             Strongly discourage   discontinuation of antibiotics    initiation of antibiotics    ----------------------------     -----------------------------       PCT 0.25 - 0.50 ng/mL            PCT 0.10 - 0.25 ng/mL               OR       >80% decrease in PCT            Discourage initiation of                                            antibiotics      Encourage discontinuation           of antibiotics    ----------------------------     -----------------------------         PCT >= 0.50 ng/mL              PCT 0.26 - 0.50 ng/mL                AND       <80% decrease in PCT             Encourage initiation of  antibiotics       Encourage continuation           of antibiotics    ----------------------------     -----------------------------        PCT >= 0.50 ng/mL                  PCT > 0.50 ng/mL               AND         increase in PCT                  Strongly encourage                                      initiation of antibiotics    Strongly encourage escalation           of antibiotics                                     -----------------------------                                           PCT <= 0.25 ng/mL                                                 OR                                        > 80% decrease in PCT                                     Discontinue / Do not initiate                                             antibiotics Performed at [facility], [address] [place], [place] [address]   Glucose, capillary     Status: Abnormal   Collection Time: 05/01/18  7:09 AM  Result Value Ref Range   Glucose-Capillary 148 (H) 65 - 99 mg/dL   Comment 1 Capillary Specimen    Comment 2 Notify RN   Glucose, capillary     Status: Abnormal   Collection Time: 05/01/18 11:08 AM  Result Value Ref Range    Glucose-Capillary 136 (H) 65 - 99 mg/dL   Comment 1 Capillary Specimen    Comment 2 Notify RN     ECG   N/A  Telemetry   Sinus rhythm with T wave inversions - Personally Reviewed  Radiology    Dg Chest Port 1 View  Result Date: 05/01/2018 CLINICAL DATA:  Respiratory failure. EXAM: PORTABLE CHEST 1 VIEW COMPARISON:  04/30/2018. FINDINGS: Endotracheal tube, NG tube, right IJ line stable position. Stable cardiomegaly persistent bibasilar infiltrates/edema and small bilateral pleural effusions. No interim  change. No pneumothorax. IMPRESSION: 1.  Lines and tubes in stable position. 2. Persistent bibasilar infiltrates/edema and small bilateral pleural effusions. No significant interim change. 3.  Stable cardiomegaly. Electronically Signed   By: Marcello Moores  Register   On: 05/01/2018 06:25   Dg Chest Port 1 View  Result Date: 04/30/2018 CLINICAL DATA:  Intubation EXAM: PORTABLE CHEST 1 VIEW COMPARISON:  04/29/2018 FINDINGS: Endotracheal tube in good position. NG tube in the stomach. Central venous catheter tip in the lower SVC unchanged. No pneumothorax Cardiac enlargement. Diffuse bilateral airspace disease has progressed, probable edema. Progression of bibasilar atelectasis and small pleural effusions bilaterally. IMPRESSION: Support lines remain in good position Worsening bilateral airspace disease most consistent with pulmonary edema. Progression of bibasilar atelectasis and bilateral effusions. Electronically Signed   By: Franchot Gallo M.D.   On: 04/30/2018 07:16    Cardiac Studies   LV EF: 30% -   35%  ------------------------------------------------------------------- Indications:      Abnormal EKG 794.31.  ------------------------------------------------------------------- Study Conclusions  - Left ventricle: The cavity size was normal. There was mild   concentric hypertrophy. Systolic function was moderately to   severely reduced. The estimated ejection fraction was in the    range of 30% to 35%. Features are consistent with a pseudonormal   left ventricular filling pattern, with concomitant abnormal   relaxation and increased filling pressure (grade 2 diastolic   dysfunction). Doppler parameters are consistent with elevated   ventricular end-diastolic filling pressure. - Mitral valve: Calcified annulus. Moderately thickened, moderately   calcified leaflets . There was mild regurgitation. - Left atrium: The atrium was mildly dilated. - Right ventricle: The cavity size was moderately dilated. Wall   thickness was normal. Systolic function was moderately reduced. - Tricuspid valve: There was mild regurgitation. - Pulmonary arteries: Systolic pressure was mildly increased. PA   peak pressure: 34 mm Hg (S). - Pericardium, extracardiac: A trivial pericardial effusion was   identified. Features were not consistent with tamponade   physiology.  Impressions:  - When compared to the prior study from 04/17/2017 LVEF has   decreased from 55-60% to 30-35% with new wall motion   abnormalities - diffuse hypokinesis and akinesis of the basal and   mid anteroseptal, anterior and apical septal and anterior walls.   RVEF is moderately decreased.  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 Plan for possible extubation today - troponin trending down on aspirin and heparin. Start  low dose BB if bp allows. Plan possible LHC next week, depending on neurologic status.  Time Spent Directly with Patient:  I have spent a total of 25 minutes with the patient reviewing hospital notes, telemetry, EKGs, labs and examining the patient as well as establishing an assessment and plan that was discussed personally with the patient. > 50% of time was spent in direct patient care.  Length  of Stay:  LOS: 6 days   Pixie Casino, MD, [facility], [place] Director of the [facility] Lipid Disorders &  Cardiovascular Risk Reduction Clinic Diplomate of the American Board of Clinical Lipidology Attending Cardiologist  Direct Dial: [phone]  Fax: [phone]  Website:  [email or web address] Anaid Haney 05/01/2018, 11:48 AM

## 2018-05-01 NOTE — Progress Notes (Signed)
PULMONARY / CRITICAL CARE MEDICINE   Name: [name] MRN: [id number] DOB: Sep 02, 1966    ADMISSION DATE:  04/24/2018 CONSULTATION DATE:  04/25/2018  REFERRING MD:  Dr. 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 Post-Op patient lost pulse to left foot and was taken back to OR for thrombectomy. On 6/3 patient went to outpatient vascular appointment where she was found to by hypotensive and febrile with a occlusion noted to bypass graft. Patient was told at this time to got to ED however, she was going on a camping trip in which she did not want to miss. Sent home with clindamycin.   On 6/7 patient presented to ED with AMS with pain to left lower leg and emesis and diarrhea. On arrival BP 71/48. LA 1.14. Given 3.5L NS without improvement, started on levophed gtt. Administered Vancomycin and Zosyn. LLE with no pulse and gangrenous. Vascular Consulted. Plans for BKA on Monday. PCCM asked to admit.  Patient states she took Xarelto last on 6/7.   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  Course has been complicated by AKI and NSTEMI.  Now requiring intermittent HD.  SUBJECTIVE:  PRVC: 30% Currently undergoing HD Low dose precedex, pt is awake, following commands Off Vasopressors  VITAL SIGNS: BP 119/68 (BP Location: Right Wrist)   Pulse 64   Temp 98.2 F (36.8 C) (Oral)   Resp 18   Ht 5\' 7"  (1.702 m)   Wt 201 lb 4.5 oz (91.3 kg)   SpO2 100%   BMI 31.52 kg/m   HEMODYNAMICS: CVP:  [10 mmHg-13 mmHg] 11 mmHg  VENTILATOR SETTINGS: Vent Mode: PRVC FiO2 (%):  [30 %-40 %] 30 % Set Rate:  [18 bmp] 18 bmp Vt Set:  [490 mL] 490 mL PEEP:  [5 cmH20] 5 cmH20 Pressure Support:  [5 cmH20] 5 cmH20 Plateau Pressure:  [13 cmH20-24 cmH20] 18 cmH20  INTAKE /  OUTPUT: I/O last 3 completed shifts: In: 2311 [I.V.:1048.4; Other:30; NG/GT:1082.6; IV Piggyback:150] Out: 19 [Urine:394; Emesis/NG output:625]  PHYSICAL EXAMINATION: General: Acutely ill-appearing female, laying in bed, intubated, currently on PRVC, undergoing HD, in no acute distress Neuro: Awake, alert, follows commands, right pupil 5 mm and left 3 mm (no change from previous), no focal deficits HEENT: Atraumatic, normocephalic, ETT in place, neck supple Cardiovascular: RRR, No M/R/G, Telemetry continues to have ST depression and T wave inversion Lungs: Coarse breath sounds throughout, even, non-labored, PRVC, no assessory muscle use Abdomen: soft, non-tender, non-distended, BS+ x4  Musculoskeletal: LLE AKA, 2+ edema [name]  Skin: Dry, warm. Left AKA wrapped in dressing.  LABS:  BMET Recent Labs  Lab 04/29/18 0320 04/30/18 0500 04/30/18 1440 05/01/18 0500  NA 133* 135  --  134*  K 3.2* 3.1* 3.7 3.3*  CL 93* 94*  --  98*  CO2 26 26  --  27  BUN 68* 50*  --  57*  CREATININE 7.61* 6.38*  --  7.04*  GLUCOSE 103* 82  --  129*    Electrolytes Recent Labs  Lab 04/25/18 0318  04/26/18 0404  04/27/18 0435 04/28/18 0500 04/29/18 0320 04/30/18 0500 05/01/18 0500  CALCIUM 7.4*   < > 7.6*   < > 8.0* 7.6* 7.0* 7.0* 7.4*  MG  1.1*  --  1.1*  --  1.4* 1.5* 1.7  --   --   PHOS 7.0*  --  7.9*  --   --   --   --   --   --    < > = values in this interval not displayed.    CBC Recent Labs  Lab 04/29/18 0320 04/30/18 0500 05/01/18 0500  WBC 8.6 10.2 11.4*  HGB 9.5* 9.1* 8.3*  HCT 28.6* 28.0* 25.7*  PLT 267 210 181    Coag's Recent Labs  Lab 04/25/18 2215 04/27/18 1423 04/28/18 0500  APTT 47* 72* 96*    Sepsis Markers Recent Labs  Lab 04/25/18 0000  04/26/18 2108 04/27/18 0427  04/28/18 0500 04/29/18 0320 04/30/18 0952  LATICACIDVEN 1.14  --  0.7 0.8  --   --   --   --   PROCALCITON  --    < >  --   --    < > 1.69 1.10 0.75   < > = values in this interval  not displayed.    ABG Recent Labs  Lab 04/28/18 1756 04/29/18 0324  PHART 7.452* 7.454*  PCO2ART 39.1 37.8  PO2ART 178.0* 149.0*    Liver Enzymes Recent Labs  Lab 04/24/18 2345  AST 26  ALT 10*  ALKPHOS 131*  BILITOT 0.6  ALBUMIN 1.8*    Cardiac Enzymes Recent Labs  Lab 04/30/18 0952 04/30/18 1440 04/30/18 2045  TROPONINI 1.14* 0.91* 0.68*    Glucose Recent Labs  Lab 04/30/18 1156 04/30/18 1545 04/30/18 1925 04/30/18 2317 05/01/18 0348 05/01/18 0709  GLUCAP 79 73 105* 138* 123* 148*    Imaging Dg Chest Port 1 View  Result Date: 05/01/2018 CLINICAL DATA:  Respiratory failure. EXAM: PORTABLE CHEST 1 VIEW COMPARISON:  04/30/2018. FINDINGS: Endotracheal tube, NG tube, right IJ line stable position. Stable cardiomegaly persistent bibasilar infiltrates/edema and small bilateral pleural effusions. No interim change. No pneumothorax. IMPRESSION: 1.  Lines and tubes in stable position. 2. Persistent bibasilar infiltrates/edema and small bilateral pleural effusions. No significant interim change. 3.  Stable cardiomegaly. Electronically Signed   By: Marcello Moores  Register   On: 05/01/2018 06:25     STUDIES:  CXR 6/7 > Moderate cardiomegaly, stable. Mediastinal silhouette within normal Limits. No focal infiltrates. No pulmonary edema or visible pleural effusion. No pneumothorax. Osseous structures within normal limits ECHO 6/11>> When compared to the prior study from 04/17/2017 LVEF has decreased from 55-60% to 30-35% with new wall motion abnormalities - diffuse hypokinesis and akinesis of the basal and mid anteroseptal, anterior and apical septal and anterior walls.  RVEF is moderately decreased  CULTURES: Blood 6/7 >>negative UC 6/7 >>negative   ANTIBIOTICS: Vancomycin 6/7 >>6/11 Zosyn 6/7 >>6/11 Ceftazidime 6/11>>6/14  SIGNIFICANT EVENTS: 6/7 > Presents to ED  6/9>> Emergent Left AKA per Vascular surgery 6/11>>Nephrology consulted for worsening oliguric renal  failure, and metabolic acidosis 2/63>>ZCHYIFOYD  6/12>>1st run of HD 6/13>>Telemetry with new ST depression and T wave inversion  LINES/TUBES: PIV  R IJ Temporary HD cath 6/11>> ETT 6/11>>   DISCUSSION: 52 year old female presents in Septic Shock secondary to gangrenous left foot.  Subsequent development of oliguric AKI and elevated troponins.  Status post emergent left AKA on 6/9 per vascular surgery. On 6/11 has acute decompensation and severe AMS and confusion requiring intubation, temporary HD cath placed.  Pt now receiving intermittent HD.  ASSESSMENT / PLAN:  PULMONARY A:  Intubated due to AMS & severe confusion Pulmonary edema P: PRVC:  8 cc/kg Daily SBT VAP Bundle Pulmonary hygiene F/u ABG prn F/u CXR prn Volume removal with HD per Nephrology (goal UF 3L today 6/14)  After HD this morning, will switch to PSV and begin weaning trial.  Hopeful for extubation today 6/14   CARDIAC A: Septic Shock in setting of gangrenous left foot>>resolved -s/p L AKA on 6/9 Elevated troponin,  NSTEMI (possibly stress related in setting of hypotension) -Telemetry with new ST depression and T wave inversion on telemetry 6/13, troponin trending down 6/14 Acute HFrEF (EF 30-35% on ECHO 6/11) in setting of NSTEMI -CXR with pulmonary edema H/O A.Fib on Xarelto, PAD  P:  Maintain MAP >65 Levophed as needed to maintain MAP goal, wean as tolerated Midodrine ordered per Nephrology for HD session  Heparin gtt per pharmacy Cardiology following, appreciate input Per Cardiology, conservative management of NSTEMI, will need cardiac cath at some point Intermittent HD for volume removal (to receive HD 6/14 with UF goal of 3L)  RENAL A: Acute Kidney Injury in setting of septic shock vs Rhabdo; oliguria Non-anion gap metabolic acidosis>>resolved Mild rhabdomyolysis, CK 1049>>resolved Hypokalemia CK>>1049>>221 Renal ultrasound 6/8 normal P:   Nephrology following, appreciate  input Intermittent HD per Nephrology, plan for HD again today 6/14 with UF goal of 3L Monitor I&O's / urine output Trend BMP Ensure adequate renal perfusion Avoid nephrotoxic agents as able Replace electrolytes as indicated Hypokalemia to be repleted with HD 6/14   GI A: GERD Nausea  P:   NPO Continue TF, rate adjustment per Dietician Zofran Prn Pepcid for SUP  HEMATOLOGIC A: Anemia  Iron deficiency P:  Continue Heparin gtt per pharmacy for anticoagulation (NSTEMI, hx of A-fib) Monitor for s/sx of bleeding Follow CBC Transfuse for Hgb<7  INFECTIOUS A: Gangrenous Left Leg  -WBC 11.4 (previously 21.6 on 6/9), Afebrile (previously febrile), PCT 0.75 (peaked 2.88)  -S/p Left AKA 6/9  -Blood cultures negative P:   Monitor fever curve Trend WBC's Abx as above (Ceftazidime to complete 6/14) Follow PCT   ENDOCRINE A: DM   P:   CBG's SSI Follow ICU hypo/hyperglycemia protocol  NEURO A: Sedation needs for mechanical ventilation Acute encephalopathy in setting of sepsis and AKI P: RASS goal: 0 to -1 Daily WUA Precedex gtt to maintain RASS goal Avoid sedating meds as able Provide supportive care Lights on during the day Intermittent HD per Nephrology   FAMILY  - Updates: Updated pt's nephew at bedside 6/14.  - Inter-disciplinary family meet or Palliative Care meeting due by: 05/02/2018    Darel Hong, AGACNP-BC [facility] 05/01/2018, 8:13 AM

## 2018-05-01 NOTE — Progress Notes (Signed)
CRITICAL VALUE ALERT  Critical Value:  Troponin 0.68  Date & Time Notied:  NA, call was not received from lab reporting critical; lab noted in results review.  Provider Notified: Georgann Housekeeper NP  Orders Received/Actions taken: Continue to trend.

## 2018-05-01 NOTE — Procedures (Signed)
Extubation Procedure Note  Patient Details:   Name: [name] DOB: [birth year] MRN: [id number]   Airway Documentation:   Positive cuff leak prior to extubation.   Vent end date: 05/01/18 Vent end time: 1220   Evaluation  O2 sats: stable throughout Complications: No apparent complications Patient did tolerate procedure well. Bilateral Breath Sounds: Clear, Diminished   Yes- pt has clear ability to speak.  Pt extubated to 3L [place] with Dr. Halford Chessman and RN at bedside. No signs of distress or stridor noted.   Elwin Mocha 05/01/2018, 12:23 PM

## 2018-05-02 ENCOUNTER — Encounter (HOSPITAL_COMMUNITY): Payer: Self-pay

## 2018-05-02 ENCOUNTER — Inpatient Hospital Stay (HOSPITAL_COMMUNITY): Payer: Medicare Other

## 2018-05-02 DIAGNOSIS — I4891 Unspecified atrial fibrillation: Secondary | ICD-10-CM

## 2018-05-02 DIAGNOSIS — I5041 Acute combined systolic (congestive) and diastolic (congestive) heart failure: Secondary | ICD-10-CM

## 2018-05-02 LAB — BASIC METABOLIC PANEL
ANION GAP: 11 (ref 5–15)
Anion gap: 9 (ref 5–15)
BUN: 30 mg/dL — AB (ref 6–20)
BUN: 31 mg/dL — ABNORMAL HIGH (ref 6–20)
CO2: 29 mmol/L (ref 22–32)
CO2: 27 mmol/L (ref 22–32)
Calcium: 7.9 mg/dL — ABNORMAL LOW (ref 8.9–10.3)
Calcium: 7.9 mg/dL — ABNORMAL LOW (ref 8.9–10.3)
Chloride: 99 mmol/L — ABNORMAL LOW (ref 101–111)
Chloride: 100 mmol/L — ABNORMAL LOW (ref 101–111)
Creatinine, Ser: 4.83 mg/dL — ABNORMAL HIGH (ref 0.44–1.00)
Creatinine, Ser: 4.86 mg/dL — ABNORMAL HIGH (ref 0.44–1.00)
GFR calc Af Amer: 11 mL/min — ABNORMAL LOW (ref >60)
GFR calc non Af Amer: 9 mL/min — ABNORMAL LOW (ref >60)
GFR, EST AFRICAN AMERICAN: 11 mL/min — AB (ref >60)
GFR, EST NON AFRICAN AMERICAN: 9 mL/min — AB (ref >60)
GLUCOSE: 82 mg/dL (ref 65–99)
Glucose, Bld: 123 mg/dL — ABNORMAL HIGH (ref 65–99)
POTASSIUM: 3.6 mmol/L (ref 3.5–5.1)
POTASSIUM: 4.3 mmol/L (ref 3.5–5.1)
Sodium: 137 mmol/L (ref 135–145)
Sodium: 138 mmol/L (ref 135–145)

## 2018-05-02 LAB — CBC
HCT: 24.7 % — ABNORMAL LOW (ref 36.0–46.0)
HEMATOCRIT: 25.8 % — AB (ref 36.0–46.0)
Hemoglobin: 8 g/dL — ABNORMAL LOW (ref 12.0–15.0)
Hemoglobin: 7.4 g/dL — ABNORMAL LOW (ref 12.0–15.0)
MCH: 24.7 pg — AB (ref 26.0–34.0)
MCH: 24.7 pg — AB (ref 26.0–34.0)
MCHC: 31 g/dL (ref 30.0–36.0)
MCHC: 30 g/dL (ref 30.0–36.0)
MCV: 79.6 fL (ref 78.0–100.0)
MCV: 82.3 fL (ref 78.0–100.0)
PLATELETS: 167 10*3/uL (ref 150–400)
Platelets: 183 10*3/uL (ref 150–400)
RBC: 3.24 MIL/uL — ABNORMAL LOW (ref 3.87–5.11)
RBC: 3 MIL/uL — ABNORMAL LOW (ref 3.87–5.11)
RDW: 17.8 % — AB (ref 11.5–15.5)
RDW: 18 % — AB (ref 11.5–15.5)
WBC: 15 10*3/uL — ABNORMAL HIGH (ref 4.0–10.5)
WBC: 13.3 10*3/uL — AB (ref 4.0–10.5)

## 2018-05-02 LAB — GLUCOSE, CAPILLARY
GLUCOSE-CAPILLARY: 70 mg/dL (ref 65–99)
GLUCOSE-CAPILLARY: 73 mg/dL (ref 65–99)
Glucose-Capillary: 78 mg/dL (ref 65–99)
Glucose-Capillary: 75 mg/dL (ref 65–99)
Glucose-Capillary: 92 mg/dL (ref 65–99)

## 2018-05-02 LAB — HEPARIN LEVEL (UNFRACTIONATED)
HEPARIN UNFRACTIONATED: 0.39 [IU]/mL (ref 0.30–0.70)
HEPARIN UNFRACTIONATED: 0.34 [IU]/mL (ref 0.30–0.70)

## 2018-05-02 LAB — PROCALCITONIN: PROCALCITONIN: 0.51 ng/mL

## 2018-05-02 MED ORDER — PHENYLEPHRINE HCL-NACL 10-0.9 MG/250ML-% IV SOLN
0.0000 ug/min | INTRAVENOUS | Status: DC
  Administered 2018-05-02: 20 ug/min via INTRAVENOUS
  Filled 2018-05-02: qty 250

## 2018-05-02 MED ORDER — SODIUM CHLORIDE 0.9 % IV BOLUS
1000.0000 mL | Freq: Once | INTRAVENOUS | Status: AC
  Administered 2018-05-02: 1000 mL via INTRAVENOUS

## 2018-05-02 MED ORDER — SODIUM CHLORIDE 0.9 % IV BOLUS
500.0000 mL | Freq: Once | INTRAVENOUS | Status: AC
  Administered 2018-05-02: 500 mL via INTRAVENOUS

## 2018-05-02 MED ORDER — METOPROLOL TARTRATE 5 MG/5ML IV SOLN
2.5000 mg | Freq: Once | INTRAVENOUS | Status: DC
  Filled 2018-05-02: qty 5

## 2018-05-02 NOTE — Progress Notes (Signed)
[facility] Progress Note Patient Name: [name] DOB: 21-Dec-1965 MRN: [id number]   Date of Service  05/02/2018  HPI/Events of Note  Patient remains hypotensive s/p 500 mL IV bolus. BP = 72/45.   eICU Interventions  Will order: 1. Transfer back to ICU. 2. Phenylephrine IV infusion. Titrate to MAP = 65.  3. Monitor CVP. 4. Will ask ground team to evaluate patient.      Intervention Category Major Interventions: Hypotension - evaluation and management  Jazzalyn Loewenstein [place] 05/02/2018, 10:58 PM

## 2018-05-02 NOTE — Progress Notes (Signed)
Critical care NP paged concerning pt's BP. Will continue to monitor.

## 2018-05-02 NOTE — Progress Notes (Signed)
Admit: 04/24/2018 LOS: 13  65F with anuric AKI related to ATN from septic shock, ischemic/gangrenous LLL s/p L AKA  Subjective:   HD yesterday 3.2L UF, tol well  Extubated to [place]  Awake, alert, interactive this AM  Still low UOP   06/14 0701 - 06/15 0700 In: 1009.1 [I.V.:592.4; NG/GT:325; IV Piggyback:91.7] Out: 2637 [Urine:167; Stool:400]  Filed Weights   05/01/18 0730 05/01/18 1135 05/02/18 0200  Weight: 92.3 kg (203 lb 7.8 oz) 89.3 kg (196 lb 13.9 oz) 86.2 kg (190 lb 0.6 oz)    Scheduled Meds: . aspirin  81 mg Oral Daily  . chlorhexidine  15 mL Mouth Rinse BID  . Chlorhexidine Gluconate Cloth  6 each Topical Daily  . Chlorhexidine Gluconate Cloth  6 each Topical Q0600  . insulin aspart  0-15 Units Subcutaneous Q4H  . mouth rinse  15 mL Mouth Rinse q12n4p  . rosuvastatin  20 mg Oral q1800  . sodium chloride flush  10-40 mL Intracatheter Q12H   Continuous Infusions: . sodium chloride 10 mL/hr at 05/02/18 0400  . sodium chloride    . sodium chloride    . famotidine (PEPCID) IV Stopped (05/01/18 1115)  . heparin 1,550 Units/hr (05/02/18 0400)   PRN Meds:.sodium chloride, sodium chloride, sodium chloride, alteplase, docusate, heparin, metoprolol tartrate, morphine injection, nitroGLYCERIN, ondansetron (ZOFRAN) IV, sodium chloride flush  Current Labs: reviewed    Physical Exam:  Blood pressure (!) 110/59, pulse 80, temperature 97.6 F (36.4 C), temperature source Oral, resp. rate 10, height 5\' 7"  (1.702 m), weight 86.2 kg (190 lb 0.6 oz), SpO2 97 %. GEN: Chronically ill-appearing, ENT: NCAT, poor dentition EYES: EOMI CV: RRR, no rub, normal S1 and S2 PULM: CTAB anteriorly ABD: Soft, nontender SKIN: Left stump bandaged EXT: Right lower extremity without edema R IJ Temp HD cath present  A 1. Anuric AKI from ATN, normal baseline SCr 1. Started HD 04/29/18 2. No evidence for recovery at this time 3. Has R IJ Temp HD cath [facility] 04/28/18  2. PAD, s/p L AKA 6/9 for  ischemic gangrenous LLE 3. Septic shock, improved off pressors 4. DM2 5. NSTEMI, cardiology following new depressed LVEF and RWMA on 6/11 TTE 6. Hx/o AFib 7. VDRF, extubated   P 1. HD tentatively on Monday;  2. Follow renal function over weekend; Daily weights, Daily Renal Panel, Strict I/Os, Avoid nephrotoxins (NSAIDs, judicious IV Contrast)  Medication Issues;  Preferred narcotic agents for pain control are hydromorphone, fentanyl, and methadone. Morphine should not be used. Oxydone is not preferred.   Baclofen should not be used.  Avoid Fleets and Magnesium Citrate Bowel Preps  Pearson Grippe MD 05/02/2018, 7:11 AM  Recent Labs  Lab 04/26/18 0404  04/29/18 0320 04/30/18 0500 04/30/18 1440 05/01/18 0500  NA 134*   < > 133* 135  --  134*  K 4.1   < > 3.2* 3.1* 3.7 3.3*  CL 102   < > 93* 94*  --  98*  CO2 16*   < > 26 26  --  27  GLUCOSE 136*   < > 103* 82  --  129*  BUN 56*   < > 68* 50*  --  57*  CREATININE 7.63*   < > 7.61* 6.38*  --  7.04*  CALCIUM 7.6*   < > 7.0* 7.0*  --  7.4*  PHOS 7.9*  --   --   --   --   --    < > = values in this  interval not displayed.   Recent Labs  Lab 04/30/18 0500 05/01/18 0500 05/02/18 0604  WBC 10.2 11.4* 15.0*  HGB 9.1* 8.3* 8.0*  HCT 28.0* 25.7* 25.8*  MCV 75.7* 76.0* 79.6  PLT 210 181 183

## 2018-05-02 NOTE — Evaluation (Signed)
Clinical/Bedside Swallow Evaluation Patient Details  Name: [name] MRN: [id number] Date of Birth: January 16, 1966  Today's Date: 05/02/2018 Time: SLP Start Time (ACUTE ONLY): 1204 SLP Stop Time (ACUTE ONLY): 1220 SLP Time Calculation (min) (ACUTE ONLY): 16 min  Past Medical History:  Past Medical History:  Diagnosis Date  . Abnormal stress test    a. 02/2017 MV: large region of fixed perfusion defect in basal to mid inf, mid-dist inflat walls, EF 43%. No ischemia (EF 55-60% by f/u echo).  . Arthritis   . Asthma   . Chronic back pain   . Coronary artery calcification seen on CT scan    a. 11/2017 CT Abd/Pelvis: Multi vessel coronary vascular Ca2+.  . Depression   . Diabetes mellitus   . Diabetic neuropathy ([place])   . Family history of adverse reaction to anesthesia    mother had difficlty waking   . Femoral-popliteal bypass graft occlusion, left ([place]) 12/02/2017  . GERD (gastroesophageal reflux disease)   . History of echocardiogram    a. 03/2017 Echo: EF 55-60%, mild LVH, nl RV fxn.  . Hyperlipidemia   . Osteomyelitis of right fibula ([place]) 03/05/2017  . PAD (peripheral artery disease) ([place])   . Paroxysmal atrial fibrillation with rapid ventricular response ([place]) 12/02/2017   a. CHA2DS2VASc = 3-->Xarelto.  Marland Kitchen Ulcer    Foot   Past Surgical History:  Past Surgical History:  Procedure Laterality Date  . ABDOMINAL AORTAGRAM  June 15, 2014  . ABDOMINAL AORTAGRAM N/A 06/15/2014   Procedure: ABDOMINAL Maxcine Ham;  Surgeon: Serafina Mitchell, MD;  Location: [facility] CATH LAB;  Service: Cardiovascular;  Laterality: N/A;  . ABDOMINAL AORTAGRAM N/A 11/22/2014   Procedure: ABDOMINAL AORTAGRAM;  Surgeon: Serafina Mitchell, MD;  Location: [facility] CATH LAB;  Service: Cardiovascular;  Laterality: N/A;  . ABDOMINAL AORTOGRAM W/LOWER EXTREMITY N/A 01/07/2017   Procedure: Abdominal Aortogram w/Lower Extremity;  Surgeon: Serafina Mitchell, MD;  Location: [place] CV LAB;  Service: Cardiovascular;  Laterality: N/A;  .  ABDOMINAL AORTOGRAM W/LOWER EXTREMITY N/A 10/31/2017   Procedure: ABDOMINAL AORTOGRAM W/LOWER EXTREMITY;  Surgeon: Elam Dutch, MD;  Location: [place] CV LAB;  Service: Cardiovascular;  Laterality: N/A;  . ABDOMINAL AORTOGRAM W/LOWER EXTREMITY N/A 03/24/2018   Procedure: ABDOMINAL AORTOGRAM W/LOWER EXTREMITY;  Surgeon: Serafina Mitchell, MD;  Location: [place] CV LAB;  Service: Cardiovascular;  Laterality: N/A;  . AMPUTATION Left 04/26/2018   Procedure: AMPUTATION ABOVE KNEE;  Surgeon: Elam Dutch, MD;  Location: [place];  Service: Vascular;  Laterality: Left;  . AORTA - BILATERAL FEMORAL ARTERY BYPASS GRAFT N/A 11/28/2017   Procedure: AORTA BIFEMORAL BYPASS USING HEMASHIELD GOLD GRAFT & REIMPLANT IMA;  Surgeon: Serafina Mitchell, MD;  Location: [facility] OR;  Service: Vascular;  Laterality: N/A;  . AORTIC ARCH ANGIOGRAPHY N/A 10/31/2017   Procedure: AORTIC ARCH ANGIOGRAPHY;  Surgeon: Elam Dutch, MD;  Location: [place] CV LAB;  Service: Cardiovascular;  Laterality: N/A;  . APPLICATION OF WOUND VAC  11/28/2017   Procedure: APPLICATION OF WOUND VAC;  Surgeon: Serafina Mitchell, MD;  Location: [facility] OR;  Service: Vascular;;  . APPLICATION OF WOUND VAC Left 03/27/2018   Procedure: APPLICATION OF WOUND VAC LEFT GROIN USING PREVENA PLUS;  Surgeon: Serafina Mitchell, MD;  Location: [place];  Service: Vascular;  Laterality: Left;  . ARTERIAL BYPASS SURGERY   07/05/2010   Right Common Femoral to below knee popliteal BPG  . BACK SURGERY     X's  2  .  CARDIAC CATHETERIZATION    . CHOLECYSTECTOMY     Gall Bladder  . CYSTECTOMY Right    foot  . CYSTECTOMY Left    wrist  . EMBOLECTOMY Left 11/28/2017   Procedure: Left Lower Extremity Embolectomy, Left Tibial Peroneal Trunk Endarterectomy with Patch Angioplasty; Vein Harvest Small Saphenous Graft Left Lower Leg;  Surgeon: [name], MD;  Location: [place];  Service: Vascular;  Laterality: Left;  . FEMORAL-POPLITEAL BYPASS GRAFT Left  03/27/2018   Procedure: THROMBECTOMY OF LEFT FEMORAL TIBIAL BYPASS;  Surgeon: Serafina Mitchell, MD;  Location: [place];  Service: Vascular;  Laterality: Left;  . FEMORAL-TIBIAL BYPASS GRAFT Left 03/27/2018   Procedure: BYPASS GRAFT FEMORAL-TIBIAL ARTERY LEFT REDO USING CRYOPRESERVED SAPHENOUS VEIN 70cm;  Surgeon: Serafina Mitchell, MD;  Location[facility];  Service: Vascular;  Laterality: Left;  . INTERCOSTAL NERVE BLOCK  November 2015  . INTRAOPERATIVE ARTERIOGRAM  11/28/2017   Procedure: INTRA OPERATIVE ARTERIOGRAM OF LEFT LEG;  Surgeon: Serafina Mitchell, MD;  Location: [place];  Service: Vascular;;  . INTRAOPERATIVE ARTERIOGRAM Left 03/27/2018   Procedure: INTRA OPERATIVE ARTERIOGRAM TIMES TWO;  Surgeon: Serafina Mitchell, MD;  Location: [place];  Service: Vascular;  Laterality: Left;  . IR FLUORO GUIDE CV LINE RIGHT  03/20/2017  . IR US GUIDE VASC ACCESS RIGHT  03/20/2017  . IRRIGATION AND DEBRIDEMENT BUTTOCKS Right 09/30/2016   Procedure: DEBRIDEMENT RIGHT  BUTTOCK WOUND;  Surgeon: Georganna Skeans, MD;  Location: [place];  Service: General;  Laterality: Right;  . left foot surgery    . left wrist cyst removal Left   . PERIPHERAL VASCULAR CATHETERIZATION N/A 05/07/2016   Procedure: Abdominal Aortogram;  Surgeon: Serafina Mitchell, MD;  Location: [place] CV LAB;  Service: Cardiovascular;  Laterality: N/A;  . PERIPHERAL VASCULAR CATHETERIZATION N/A 05/07/2016   Procedure: Lower Extremity Angiography;  Surgeon: Serafina Mitchell, MD;  Location: [place] CV LAB;  Service: Cardiovascular;  Laterality: N/A;  . PERIPHERAL VASCULAR CATHETERIZATION N/A 05/07/2016   Procedure: Aortic Arch Angiography;  Surgeon: Serafina Mitchell, MD;  Location: [place] CV LAB;  Service: Cardiovascular;  Laterality: N/A;  . PERIPHERAL VASCULAR CATHETERIZATION N/A 05/07/2016   Procedure: Upper Extremity Angiography;  Surgeon: Serafina Mitchell, MD;  Location: [place] CV LAB;  Service: Cardiovascular;  Laterality: N/A;  . PERIPHERAL  VASCULAR CATHETERIZATION Right 05/07/2016   Procedure: Peripheral Vascular Balloon Angioplasty;  Surgeon: Serafina Mitchell, MD;  Location: [place] CV LAB;  Service: Cardiovascular;  Laterality: Right;  subclavian  . PERIPHERAL VASCULAR CATHETERIZATION Right 05/07/2016   Procedure: Peripheral Vascular Intervention;  Surgeon: Serafina Mitchell, MD;  Location: [place] CV LAB;  Service: Cardiovascular;  Laterality: Right;  External  Iliac  . SKIN GRAFT Right 2012   [name] by Dr. Nils Pyle- Right and Left Ankle  . SPINE SURGERY    . THROMBECTOMY FEMORAL ARTERY  11/28/2017   Procedure: THROMBECTOMY  & REVISION OF BILATERAL FEMORAL TO POPLETEAL ARTERIES;  Surgeon: Serafina Mitchell, MD;  Location: [facility] OR;  Service: Vascular;;  . TONSILLECTOMY     HPI:  52 year old female with PMH of DM, PAD, A.Fib on Xarelto, Chronic Wounds of Left Foot with H/O of ABF and Fem-Pop Bypass. Recent Admission 5/10-5/14 for redo of Left femoral to PTA bypass with cryopreserved vein. Post-Op patient lost pulse to left foot and was taken back to OR for thrombectomy. On 6/3 patient went to outpatient vascular appointment where she was found to by hypotensive  and febrile with a occlusion noted to bypass graft. 6/7 patient presented to ED with AMS with pain to left lower leg and emesis and diarrhea. 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  Course has been complicated by AKI and NSTEMI.  Now requiring intermittent HD. ETT 6/11-6/14.   Assessment / Plan / Recommendation Clinical Impression  Patient presents with oropharyngeal swallow which appears at bedside to be within functional limits with adequate airway protection. No overt signs of aspiration observed despite challenging with consecutive straw sips of thin liquids in excess of 3oz. Mastication of regular solid is prolonged due to dentition; pt reports she prefers soft foods and that she typically avoids hard or chewy foods. Recommend soft diet with thin liquids, medications whole  with liquid. No further skilled ST needs identified. Will s/o.   SLP Visit Diagnosis: Dysphagia, unspecified (R13.10)    Aspiration Risk  Mild aspiration risk    Diet Recommendation Dysphagia 3 (Mech soft);Thin liquid   Liquid Administration via: Cup;Straw Medication Administration: Whole meds with liquid Supervision: Patient able to self feed    Other  Recommendations Oral Care Recommendations: Oral care BID   Follow up Recommendations None      Frequency and Duration            Prognosis Prognosis for Safe Diet Advancement: Fair(dentition; prefers softer foods)      Swallow Study   General Date of Onset: 04/24/18 HPI: 52 year old female with PMH of DM, PAD, A.Fib on Xarelto, Chronic Wounds of Left Foot with H/O of ABF and Fem-Pop Bypass. Recent Admission 5/10-5/14 for redo of Left femoral to PTA bypass with cryopreserved vein. Post-Op patient lost pulse to left foot and was taken back to OR for thrombectomy. On 6/3 patient went to outpatient vascular appointment where she was found to by hypotensive and febrile with a occlusion noted to bypass graft. 6/7 patient presented to ED with AMS with pain to left lower leg and emesis and diarrhea. 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  Course has been complicated by AKI and NSTEMI.  Now requiring intermittent HD. ETT 6/11-6/14. Type of Study: Bedside Swallow Evaluation Previous Swallow Assessment: none on file Diet Prior to this Study: NPO Temperature Spikes Noted: No Respiratory Status: Nasal cannula History of Recent Intubation: Yes Length of Intubations (days): 4 days Date extubated: 05/01/18 Behavior/Cognition: Alert;Cooperative Oral Cavity Assessment: Within Functional Limits Oral Care Completed by SLP: Yes Oral Cavity - Dentition: Missing dentition;Poor condition Vision: Functional for self-feeding(legally blind but able to feed self with set-up) Self-Feeding Abilities: Able to feed self;Needs set up Patient Positioning:  Upright in bed Baseline Vocal Quality: Hoarse(mildly hoarse) Volitional Cough: Strong Volitional Swallow: Able to elicit    Oral/Motor/Sensory Function Overall Oral Motor/Sensory Function: Within functional limits   Ice Chips Ice chips: Within functional limits   Thin Liquid Thin Liquid: Within functional limits Presentation: Cup;Self Fed;Straw    Nectar Thick Nectar Thick Liquid: Not tested   Honey Thick Honey Thick Liquid: Not tested   Puree Puree: Within functional limits Presentation: Spoon;Self Fed   Solid   GO   Solid: Impaired Presentation: Self Fed Oral Phase Impairments: Impaired mastication Oral Phase Functional Implications: Impaired mastication       Deneise Lever, [place], CCC-SLP Speech-Language Pathologist [phone]  Aliene Altes 05/02/2018,1:13 PM

## 2018-05-02 NOTE — Significant Event (Signed)
Rapid Response Event Note  Overview: Follow-Up - Hypotension  Initial Focused Assessment: I went to see the patient as a follow up at 2010 to see how the BPs were trending. Per night RN, patient received a total of 1.5L NS bolus for hypotension earlier on day shift. Earlier this evening, patient's heart rhythm went into AFIB 100-130s, patient received 2.5mg  Metoprolol IV at 1527. Around 1700, SBP were in the 70s.   Patient was awake, alert, and attempting to eat dinner with the help of her husband. She has been confused on and off today, her husband and the nurse said that this is not new. Patient stated that earlier around 6pm she did feel a little lightheaded, but currently appears asymptomatic of BP and HR. Skin is warm, upper extremities are pink and warm. [name] is warm, + pulse, but does have some discoloration. UO from 1500-2100 was only 60cc, which I documented, + AKI/ATN, was last dialyzed on 6/14. HR currently was in the 130-140s but patient is using her arm and eating. After she was settled, HR was in the 90s, AF.   Interventions: -- I called [facility], spoke with [facility] RN, updated her on the patient's condition. She will update the MD and RN/MD can reach out to primary RN and RR if needed of if orders are placed.  Plan of Care (if not transferred): -- Currently Q30 min BPs are cycling. RN to monitor BP and document UO if patient.  Event Summary:   at    [place] Time 2010 End Time 2115  Kali Ambler R

## 2018-05-02 NOTE — Progress Notes (Signed)
[facility] Progress Note Patient Name: GWENDLOYN FORSEE DOB: 1966-10-25 MRN: [id number]   Date of Service  05/02/2018  HPI/Events of Note  Hypotension - No CVP available. LVEF = 30% to 35%.   eICU Interventions  Will order: 1. Bolus with 0.9 NaCl 500 mL IV over 30 minutes now.  2. Hold  Metoprolol IV dose for SBP < 100 or HR < 60.      Intervention Category Major Interventions: Hypotension - evaluation and management  Lysle Dingwall 05/02/2018, 9:34 PM

## 2018-05-02 NOTE — Progress Notes (Signed)
Pt converted to A.fib HR 120-130's. NP notified.

## 2018-05-02 NOTE — Progress Notes (Signed)
[place] for Heparin  Indication: atrial fibrillation  No Known Allergies  Patient Measurements: Height: 5\' 7"  (170.2 cm) Weight: 190 lb 0.6 oz (86.2 kg) IBW/kg (Calculated) : 61.6  Vital Signs: Temp: 97.7 F (36.5 C) (06/15 0721) Temp Source: Oral (06/15 0721) BP: 100/58 (06/15 0900) Pulse Rate: 83 (06/15 0900)  Labs: Recent Labs    04/30/18 0500 04/30/18 0952 04/30/18 1440 04/30/18 2045 05/01/18 0500 05/01/18 1532 05/02/18 0043 05/02/18 0604  HGB 9.1*  --   --   --  8.3*  --   --  8.0*  HCT 28.0*  --   --   --  25.7*  --   --  25.8*  PLT 210  --   --   --  181  --   --  183  HEPARINUNFRC 0.35  --   --   --  0.15* 0.25* 0.34 0.39  CREATININE 6.38*  --   --   --  7.04*  --   --  4.83*  TROPONINI  --  1.14* 0.91* 0.68*  --   --   --   --     Assessment: 52 y/o F on Xarelto PTA for afib, was transitioned to heparin while inpatient and peri-operatively. 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 Heparin was resumed s/p procedure and now s/p temp HD cath  Unfortunately, the patient suffered a NSTEMI peri-operatively; therefore, heparin is being continued for dual indications. Appears that the plan is to treat the NSTEMI with medical management for the time being.   Heparin level remains therapeutic at 0.39. No bleeding noted.   Goal of Therapy:  Heparin level 0.3-0.7 units/ml aPTT 66-102 seconds Monitor platelets by anticoagulation protocol: Yes   Plan:  Continue heparin gtt 1550 units/hr Daily heparin level and CBC  Salome Arnt, PharmD, BCPS Phone #: [phone] until 3pm All other times, call [place] x [phone] 05/02/2018 9:47 AM

## 2018-05-02 NOTE — Progress Notes (Signed)
Pts BP 82/58 manual after 1 liter bolus. MD paged and ordered 500 ml bolus to be given. Will carry out MD orders and continue to monitor.

## 2018-05-02 NOTE — Progress Notes (Signed)
Pt BP 70/30 manually and has converted to A.fib. Initially when checking on pt, pt would not stay awake. Now is more alert, but pressure is the same. MD paged.

## 2018-05-02 NOTE — Progress Notes (Signed)
ANTICOAGULATION CONSULT NOTE - Follow Up Consult  Pharmacy Consult for heparin Indication: Afib and NSTEMI  Labs: Recent Labs    04/29/18 0320  04/30/18 0500 04/30/18 0952 04/30/18 1440 04/30/18 2045 05/01/18 0500 05/01/18 1532 05/02/18 0043  HGB 9.5*  --  9.1*  --   --   --  8.3*  --   --   HCT 28.6*  --  28.0*  --   --   --  25.7*  --   --   PLT 267  --  210  --   --   --  181  --   --   HEPARINUNFRC 0.17*   < > 0.35  --   --   --  0.15* 0.25* 0.34  CREATININE 7.61*  --  6.38*  --   --   --  7.04*  --   --   TROPONINI  --   --   --  1.14* 0.91* 0.68*  --   --   --    < > = values in this interval not displayed.    Assessment/Plan:  52yo female therapeutic on heparin after rate change. Will continue gtt at current rate and confirm stable with additional level.   Wynona Neat, PharmD, BCPS  05/02/2018,1:42 AM

## 2018-05-02 NOTE — Progress Notes (Signed)
PULMONARY / CRITICAL CARE MEDICINE   Name: [name] MRN: [id number] DOB: 1965/12/03    ADMISSION DATE:  04/24/2018 CONSULTATION DATE:  04/25/2018  REFERRING MD:  Dr. 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 Post-Op patient lost pulse to left foot and was taken back to OR for thrombectomy. On 6/3 patient went to outpatient vascular appointment where she was found to by hypotensive and febrile with a occlusion noted to bypass graft. Patient was told at this time to got to ED however, she was going on a camping trip in which she did not want to miss. Sent home with clindamycin.   On 6/7 patient presented to ED with AMS with pain to left lower leg and emesis and diarrhea. On arrival BP 71/48. LA 1.14. Given 3.5L NS without improvement, started on levophed gtt. Administered Vancomycin and Zosyn. LLE with no pulse and gangrenous. Vascular Consulted. Plans for BKA on Monday. PCCM asked to admit.  Patient states she took Xarelto last on 6/7.   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  Course has been complicated by AKI and NSTEMI.  Now requiring intermittent HD.  SUBJECTIVE:  Denies chest pain, dyspnea.  VITAL SIGNS: BP (!) 100/58   Pulse 83   Temp 97.7 F (36.5 C) (Oral)   Resp 10   Ht 5\' 7"  (1.702 m)   Wt 190 lb 0.6 oz (86.2 kg)   SpO2 96%   BMI 29.76 kg/m   INTAKE / OUTPUT: I/O last 3 completed shifts: In: 2215.1 [I.V.:938.5; Other:20; LF/YB:0175; IV Piggyback:91.7] Out: 1025 [Urine:304; Other:3200; Stool:400]  PHYSICAL EXAMINATION:  General - pleasant Eyes - pupils reactive ENT - no sinus tenderness, no oral exudate, no LAN Cardiac - regular, no murmur Chest - no wheeze, rales Abd - soft, non tender Ext - Lt AKA Skin - no  rashes Neuro - normal strength  LABS:  BMET Recent Labs  Lab 04/30/18 0500 04/30/18 1440 05/01/18 0500 05/02/18 0604  NA 135  --  134* 137  K 3.1* 3.7 3.3* 3.6  CL 94*  --  98* 99*  CO2 26  --  27 29  BUN 50*  --  57* 30*  CREATININE 6.38*  --  7.04* 4.83*  GLUCOSE 82  --  129* 82    Electrolytes Recent Labs  Lab 04/26/18 0404  04/27/18 0435 04/28/18 0500 04/29/18 0320 04/30/18 0500 05/01/18 0500 05/02/18 0604  CALCIUM 7.6*   < > 8.0* 7.6* 7.0* 7.0* 7.4* 7.9*  MG 1.1*  --  1.4* 1.5* 1.7  --   --   --   PHOS 7.9*  --   --   --   --   --   --   --    < > = values in this interval not displayed.    CBC Recent Labs  Lab 04/30/18 0500 05/01/18 0500 05/02/18 0604  WBC 10.2 11.4* 15.0*  HGB 9.1* 8.3* 8.0*  HCT 28.0* 25.7* 25.8*  PLT 210 181 183    Coag's Recent Labs  Lab 04/25/18 2215 04/27/18 1423 04/28/18 0500  APTT 47* 72* 96*    Sepsis Markers Recent Labs  Lab 04/26/18 2108 04/27/18 0427  04/30/18 8527 05/01/18 0500 05/02/18 7824  LATICACIDVEN 0.7 0.8  --   --   --   --   PROCALCITON  --   --    < > 0.75 0.66 0.51   < > = values in this interval not displayed.    ABG Recent Labs  Lab 04/28/18 1756 04/29/18 0324  PHART 7.452* 7.454*  PCO2ART 39.1 37.8  PO2ART 178.0* 149.0*    Liver Enzymes No results for input(s): AST, ALT, ALKPHOS, BILITOT, ALBUMIN in the last 168 hours.  Cardiac Enzymes Recent Labs  Lab 04/30/18 0952 04/30/18 1440 04/30/18 2045  TROPONINI 1.14* 0.91* 0.68*    Glucose Recent Labs  Lab 05/01/18 1108 05/01/18 1503 05/01/18 1934 05/01/18 2325 05/02/18 0327 05/02/18 0724  GLUCAP 136* 157* 109* 83 78 75    Imaging Dg Chest Port 1 View  Result Date: 05/02/2018 CLINICAL DATA:  Pulmonary edema. EXAM: PORTABLE CHEST 1 VIEW COMPARISON:  05/01/2018. FINDINGS: Endotracheal tube and nasogastric tube have been removed. Right IJ central line tip projects over the SVC. Left IJ central line tip also projects over  the SVC. Heart size stable. Mild diffuse interstitial prominence and indistinctness with improvement in aeration in the lung bases. Small bilateral pleural effusions. IMPRESSION: Improving congestive heart failure. Electronically Signed   By: Lorin Picket M.D.   On: 05/02/2018 07:18     STUDIES:  ECHO 6/11 >> EF 30 to 35%  CULTURES: Blood 6/7 >>negative UC 6/7 >>negative   ANTIBIOTICS: Vancomycin 6/7 >>6/11 Zosyn 6/7 >>6/11 Ceftazidime 6/11>>6/14  SIGNIFICANT EVENTS: 6/07 Presents to ED  6/09 Emergent Left AKA per Vascular surgery 6/11 Nephrology consulted for worsening oliguric renal failure, and metabolic acidosis  1/02 1st run of HD 6/13 Telemetry with new ST depression and T wave inversion 6/15  Transfer to tele  LINES/TUBES: L IJ CVL 6/08 >> R IJ Temporary HD cath 6/11 >> ETT 6/11 >> 6/14  DISCUSSION: 52 yo with septic shock from Lt foot gangrene s/p AKA.  Post op course complicated by VDRF, altered mental status, CHF, A fib, AKI.  Extubated 6/14 w/o difficulty.  ASSESSMENT / PLAN:  NSTEMI. HFrEF with acute systolic CHF. A fib with RVR. - heparin gtt, ASA, crestor - cards planning LHC next week  Sepsis from Lt foot gangrene s/p AKA. - wound care  AKI from ATN. - HD per renal  Anemia of critical illness and chronic disease. - f/u CBC  DM type II. - SSI  Dysphagia. - speech to assess  DVT prophylaxis - heparin gtt SUP - pepcid Nutrition - NPO Goals of care - full code  To tele 6/15 >> [facility] 6/16 and PCCM off  Chesley Mires, MD [place] 05/02/2018, 9:41 AM

## 2018-05-02 NOTE — Progress Notes (Signed)
    Called by RN with patient converting back in Afib RVR. Noted to range between 110-140 on telemetry while at rest. Pt appears comfortable, no distress. Will give one time dose of 2.5mg  lopressor now. Consider restarting BB if blood pressures remain stable. Remains on IV heparin.   SignedReino Bellis, NP-C 05/02/2018, 4:33 PM Pager: [phone]

## 2018-05-02 NOTE — Progress Notes (Signed)
Progress Note  Patient Name: [name] Date of Encounter: 05/02/2018  Primary Cardiologist: Nelva Bush, MD   Subjective   Feeling well.  Wants to go home.  No chest pain or SOB.   Inpatient Medications    Scheduled Meds: . aspirin  81 mg Oral Daily  . chlorhexidine  15 mL Mouth Rinse BID  . Chlorhexidine Gluconate Cloth  6 each Topical Daily  . Chlorhexidine Gluconate Cloth  6 each Topical Q0600  . insulin aspart  0-15 Units Subcutaneous Q4H  . mouth rinse  15 mL Mouth Rinse q12n4p  . rosuvastatin  20 mg Oral q1800  . sodium chloride flush  10-40 mL Intracatheter Q12H   Continuous Infusions: . sodium chloride 10 mL/hr at 05/02/18 0400  . sodium chloride    . sodium chloride    . famotidine (PEPCID) IV 20 mg (05/02/18 1047)  . heparin 1,550 Units/hr (05/02/18 0400)   PRN Meds: sodium chloride, sodium chloride, sodium chloride, alteplase, docusate, heparin, metoprolol tartrate, morphine injection, nitroGLYCERIN, ondansetron (ZOFRAN) IV, sodium chloride flush   Vital Signs    Vitals:   05/02/18 0700 05/02/18 0721 05/02/18 0800 05/02/18 0900  BP: (!) 110/59  107/68 (!) 100/58  Pulse: 80  82 83  Resp: 10  12 10   Temp:  97.7 F (36.5 C)    TempSrc:  Oral    SpO2: 97%  95% 96%  Weight:      Height:        Intake/Output Summary (Last 24 hours) at 05/02/2018 1100 Last data filed at 05/02/2018 0900 Gross per 24 hour  Intake 801.59 ml  Output 3767 ml  Net -2965.41 ml   Filed Weights   05/01/18 0730 05/01/18 1135 05/02/18 0200  Weight: 203 lb 7.8 oz (92.3 kg) 196 lb 13.9 oz (89.3 kg) 190 lb 0.6 oz (86.2 kg)    Telemetry    Atrial fibrillation overnight.  Now sinus rhythm with PVCs.  - Personally Reviewed  ECG    N/a  - Personally Reviewed  Physical Exam   VS:  BP 119/71   Pulse 84   Temp 97.7 F (36.5 C) (Oral)   Resp 17   Ht 5\' 7"  (1.702 m)   Wt 190 lb 0.6 oz (86.2 kg)   SpO2 96%   BMI 29.76 kg/m  , BMI Body mass index is 29.76  kg/m. GENERAL:  Chronically ill-appearing HEENT: Pupils equal round and reactive, fundi not visualized, oral mucosa unremarkable NECK:  No jugular venous distention, waveform within normal limits, carotid upstroke brisk and symmetric, no bruits LUNGS:  Clear to auscultation bilaterally HEART:  RRR.  PMI not displaced or sustained,S1 and S2 within normal limits, no S3, no S4, no clicks, no rubs, no murmurs ABD:  Flat, positive bowel sounds normal in frequency in pitch, no bruits, no rebound, no guarding, no midline pulsatile mass, no hepatomegaly, no splenomegaly EXT:  2 plus pulses throughout, no edema, no cyanosis no clubbing SKIN:  No rashes no nodules NEURO:  Cranial nerves II through XII grossly intact, motor grossly intact throughout PSYCH:  Cognitively intact, oriented to person place and time   Labs    Chemistry Recent Labs  Lab 04/30/18 0500 04/30/18 1440 05/01/18 0500 05/02/18 0604  NA 135  --  134* 137  K 3.1* 3.7 3.3* 3.6  CL 94*  --  98* 99*  CO2 26  --  27 29  GLUCOSE 82  --  129* 82  BUN 50*  --  57* 30*  CREATININE 6.38*  --  7.04* 4.83*  CALCIUM 7.0*  --  7.4* 7.9*  GFRNONAA 7*  --  6* 9*  GFRAA 8*  --  7* 11*  ANIONGAP 15  --  9 9     Hematology Recent Labs  Lab 04/30/18 0500 05/01/18 0500 05/02/18 0604  WBC 10.2 11.4* 15.0*  RBC 3.70* 3.38* 3.24*  HGB 9.1* 8.3* 8.0*  HCT 28.0* 25.7* 25.8*  MCV 75.7* 76.0* 79.6  MCH 24.6* 24.6* 24.7*  MCHC 32.5 32.3 31.0  RDW 17.4* 17.3* 17.8*  PLT 210 181 183    Cardiac Enzymes Recent Labs  Lab 04/28/18 0500 04/30/18 0952 04/30/18 1440 04/30/18 2045  TROPONINI 1.49* 1.14* 0.91* 0.68*   No results for input(s): TROPIPOC in the last 168 hours.   BNPNo results for input(s): BNP, PROBNP in the last 168 hours.   DDimer No results for input(s): DDIMER in the last 168 hours.   Radiology    Dg Chest Port 1 View  Result Date: 05/02/2018 CLINICAL DATA:  Pulmonary edema. EXAM: PORTABLE CHEST 1 VIEW  COMPARISON:  05/01/2018. FINDINGS: Endotracheal tube and nasogastric tube have been removed. Right IJ central line tip projects over the SVC. Left IJ central line tip also projects over the SVC. Heart size stable. Mild diffuse interstitial prominence and indistinctness with improvement in aeration in the lung bases. Small bilateral pleural effusions. IMPRESSION: Improving congestive heart failure. Electronically Signed   By: Lorin Picket M.D.   On: 05/02/2018 07:18   Dg Chest Port 1 View  Result Date: 05/01/2018 CLINICAL DATA:  Respiratory failure. EXAM: PORTABLE CHEST 1 VIEW COMPARISON:  04/30/2018. FINDINGS: Endotracheal tube, NG tube, right IJ line stable position. Stable cardiomegaly persistent bibasilar infiltrates/edema and small bilateral pleural effusions. No interim change. No pneumothorax. IMPRESSION: 1.  Lines and tubes in stable position. 2. Persistent bibasilar infiltrates/edema and small bilateral pleural effusions. No significant interim change. 3.  Stable cardiomegaly. Electronically Signed   By: Marcello Moores  Register   On: 05/01/2018 06:25    Cardiac Studies   Echo 04/2018:  Study Conclusions  - Left ventricle: The cavity size was normal. There was mild concentric hypertrophy. Systolic function was moderately to severely reduced. The estimated ejection fraction was in the range of 30% to 35%. Features are consistent with a pseudonormal left ventricular filling pattern, with concomitant abnormal relaxation and increased filling pressure (grade 2 diastolic dysfunction). Doppler parameters are consistent with elevated ventricular end-diastolic filling pressure. - Mitral valve: Calcified annulus. Moderately thickened, moderately calcified leaflets . There was mild regurgitation. - Left atrium: The atrium was mildly dilated. - Right ventricle: The cavity size was moderately dilated. Wall thickness was normal. Systolic function was moderately reduced. - Tricuspid  valve: There was mild regurgitation. - Pulmonary arteries: Systolic pressure was mildly increased. PA peak pressure: 34 mm Hg (S). - Pericardium, extracardiac: A trivial pericardial effusion was identified. Features were not consistent with tamponade physiology.  Impressions:  - When compared to the prior study from 04/17/2017 LVEF has decreased from 55-60% to 30-35% with new wall motion abnormalities - diffuse hypokinesis and akinesis of the basal and mid anteroseptal, anterior and apical septal and anterior walls. RVEF is moderately decreased.    Patient Profile     52 y.o. female with PAD, paroxysmal atrial fibrillation, diabetes, and chronic limb ischemia here with complications of L femoral to PTA bypass.  She was admitted for redo L fem to PTA bypass 03/2018 then developed occlusion requiring thrombectomy.  On 6/3 she had re-occlusion, was hypotensive and febrile but declined admission b/c she wanted to go on a camping trip.  Presented to the ED 6/7 with sepsis requiring pressors.  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  Cardiology consulted for NSTEMI and acute systolic and diastolic heart failure.    Assessment & Plan    # Acute systolic and diastolic heart failure:  LVEF this admission reduced to 30-35% down from 55-60% 03/2017.  Plan for LHC likely Monday.  She has intermittently required midodrine for volume removal with HD.  Beta blocker on hold 2/2 hypotension.  # Paroxysmal atrial fibrillation: Currently sinus rhythm.  Was in atrial fibrillation with RVR 6/14.  Home Xarelto on hold.  Continue heparin.  Beta blocker held for hypotension.  # AKI: She developed ATN and has required HD this admission.   # L foot gangrene/sepsis: Now s/p BKD.  Off pressors.   # Hyperlipidemia: Continue rosuvastatin.   For questions or updates, please contact [facility] Please consult [email or web address] for contact info under Cardiology/STEMI.      Signed, Skeet Latch, MD  05/02/2018,  11:00 AM

## 2018-05-03 DIAGNOSIS — R579 Shock, unspecified: Secondary | ICD-10-CM

## 2018-05-03 LAB — COMPREHENSIVE METABOLIC PANEL
ALBUMIN: 1.5 g/dL — AB (ref 3.5–5.0)
ALT: 7 U/L — ABNORMAL LOW (ref 14–54)
AST: 14 U/L — AB (ref 15–41)
Alkaline Phosphatase: 103 U/L (ref 38–126)
Anion gap: 12 (ref 5–15)
BILIRUBIN TOTAL: 0.4 mg/dL (ref 0.3–1.2)
BUN: 31 mg/dL — AB (ref 6–20)
CHLORIDE: 100 mmol/L — AB (ref 101–111)
CO2: 23 mmol/L (ref 22–32)
Calcium: 8 mg/dL — ABNORMAL LOW (ref 8.9–10.3)
Creatinine, Ser: 5.12 mg/dL — ABNORMAL HIGH (ref 0.44–1.00)
GFR calc Af Amer: 10 mL/min — ABNORMAL LOW (ref >60)
GFR calc non Af Amer: 9 mL/min — ABNORMAL LOW (ref >60)
GLUCOSE: 99 mg/dL (ref 65–99)
POTASSIUM: 3.8 mmol/L (ref 3.5–5.1)
SODIUM: 135 mmol/L (ref 135–145)
TOTAL PROTEIN: 5.4 g/dL — AB (ref 6.5–8.1)

## 2018-05-03 LAB — CBC WITH DIFFERENTIAL/PLATELET
ABS IMMATURE GRANULOCYTES: 0.7 10*3/uL — AB (ref 0.0–0.1)
BASOS ABS: 0.1 10*3/uL (ref 0.0–0.1)
BASOS PCT: 0 %
EOS ABS: 0.6 10*3/uL (ref 0.0–0.7)
Eosinophils Relative: 3 %
HCT: 27 % — ABNORMAL LOW (ref 36.0–46.0)
Hemoglobin: 8.2 g/dL — ABNORMAL LOW (ref 12.0–15.0)
IMMATURE GRANULOCYTES: 4 %
Lymphocytes Relative: 12 %
Lymphs Abs: 2.1 10*3/uL (ref 0.7–4.0)
MCH: 24.5 pg — ABNORMAL LOW (ref 26.0–34.0)
MCHC: 30.4 g/dL (ref 30.0–36.0)
MCV: 80.6 fL (ref 78.0–100.0)
MONOS PCT: 5 %
Monocytes Absolute: 0.9 10*3/uL (ref 0.1–1.0)
NEUTROS PCT: 76 %
Neutro Abs: 13.1 10*3/uL — ABNORMAL HIGH (ref 1.7–7.7)
PLATELETS: 262 10*3/uL (ref 150–400)
RBC: 3.35 MIL/uL — AB (ref 3.87–5.11)
RDW: 18.2 % — AB (ref 11.5–15.5)
WBC: 17.3 10*3/uL — AB (ref 4.0–10.5)

## 2018-05-03 LAB — GLUCOSE, CAPILLARY
GLUCOSE-CAPILLARY: 102 mg/dL — AB (ref 65–99)
GLUCOSE-CAPILLARY: 90 mg/dL (ref 65–99)
GLUCOSE-CAPILLARY: 129 mg/dL — AB (ref 65–99)
GLUCOSE-CAPILLARY: 187 mg/dL — AB (ref 65–99)
Glucose-Capillary: 107 mg/dL — ABNORMAL HIGH (ref 65–99)
Glucose-Capillary: 123 mg/dL — ABNORMAL HIGH (ref 65–99)
Glucose-Capillary: 211 mg/dL — ABNORMAL HIGH (ref 65–99)

## 2018-05-03 LAB — CORTISOL: Cortisol, Plasma: 19.1 ug/dL

## 2018-05-03 LAB — TROPONIN I
TROPONIN I: 0.35 ng/mL — AB (ref <0.03)
TROPONIN I: 0.3 ng/mL — AB (ref <0.03)
Troponin I: 0.36 ng/mL (ref <0.03)

## 2018-05-03 LAB — LACTIC ACID, PLASMA: Lactic Acid, Venous: 0.8 mmol/L (ref 0.5–1.9)

## 2018-05-03 LAB — PROTIME-INR
INR: 1.28
Prothrombin Time: 15.9 seconds — ABNORMAL HIGH (ref 11.4–15.2)

## 2018-05-03 LAB — HEPARIN LEVEL (UNFRACTIONATED): HEPARIN UNFRACTIONATED: 0.31 [IU]/mL (ref 0.30–0.70)

## 2018-05-03 LAB — PROCALCITONIN: Procalcitonin: 0.35 ng/mL

## 2018-05-03 MED ORDER — SODIUM CHLORIDE 0.9% FLUSH: 3.0000 mL | INTRAVENOUS | Status: DC | PRN

## 2018-05-03 MED ORDER — DOCUSATE SODIUM 100 MG PO CAPS
100.0000 mg | ORAL_CAPSULE | Freq: Two times a day (BID) | ORAL | Status: DC
  Administered 2018-05-04 – 2018-05-09 (×9): 100 mg via ORAL
  Filled 2018-05-03 (×14): qty 1

## 2018-05-03 MED ORDER — PANTOPRAZOLE SODIUM 40 MG PO TBEC
40.0000 mg | DELAYED_RELEASE_TABLET | Freq: Every day | ORAL | Status: DC
  Administered 2018-05-03 – 2018-05-07 (×5): 40 mg via ORAL
  Filled 2018-05-03 (×5): qty 1

## 2018-05-03 MED ORDER — INSULIN ASPART 100 UNIT/ML ~~LOC~~ SOLN
0.0000 [IU] | Freq: Every day | SUBCUTANEOUS | Status: DC
  Administered 2018-05-03: 2 [IU] via SUBCUTANEOUS

## 2018-05-03 MED ORDER — SODIUM CHLORIDE 0.9% FLUSH
3.0000 mL | Freq: Two times a day (BID) | INTRAVENOUS | Status: DC
  Administered 2018-05-04 – 2018-05-05 (×2): 3 mL via INTRAVENOUS

## 2018-05-03 MED ORDER — SODIUM CHLORIDE 0.9 % IV SOLN
0.0000 ug/min | INTRAVENOUS | Status: DC
  Administered 2018-05-03: 30 ug/min via INTRAVENOUS
  Administered 2018-05-04: 2 ug/min via INTRAVENOUS
  Filled 2018-05-03: qty 4

## 2018-05-03 MED ORDER — HYDROCORTISONE NA SUCCINATE PF 100 MG IJ SOLR
50.0000 mg | Freq: Four times a day (QID) | INTRAMUSCULAR | Status: DC
  Administered 2018-05-03 – 2018-05-05 (×9): 50 mg via INTRAVENOUS
  Filled 2018-05-03 (×9): qty 2

## 2018-05-03 MED ORDER — SODIUM CHLORIDE 0.9 % WEIGHT BASED INFUSION: 3.0000 mL/kg/h | INTRAVENOUS | Status: DC

## 2018-05-03 MED ORDER — HALOPERIDOL LACTATE 5 MG/ML IJ SOLN
2.0000 mg | INTRAMUSCULAR | Status: DC | PRN
  Administered 2018-05-03: 2 mg via INTRAVENOUS
  Filled 2018-05-03: qty 1

## 2018-05-03 MED ORDER — SODIUM CHLORIDE 0.9 % WEIGHT BASED INFUSION
1.0000 mL/kg/h | INTRAVENOUS | Status: DC
  Administered 2018-05-04: 1 mL/kg/h via INTRAVENOUS

## 2018-05-03 MED ORDER — FENTANYL CITRATE (PF) 100 MCG/2ML IJ SOLN
50.0000 ug | INTRAMUSCULAR | Status: DC | PRN
  Administered 2018-05-03 – 2018-05-05 (×4): 50 ug via INTRAVENOUS
  Filled 2018-05-03 (×4): qty 2

## 2018-05-03 MED ORDER — OXYCODONE HCL 5 MG PO TABS
5.0000 mg | ORAL_TABLET | Freq: Four times a day (QID) | ORAL | Status: DC | PRN
  Administered 2018-05-03 – 2018-05-22 (×38): 5 mg via ORAL
  Filled 2018-05-03 (×41): qty 1

## 2018-05-03 MED ORDER — ACETAMINOPHEN 325 MG PO TABS
650.0000 mg | ORAL_TABLET | Freq: Four times a day (QID) | ORAL | Status: DC | PRN
  Administered 2018-05-03 – 2018-05-22 (×21): 650 mg via ORAL
  Filled 2018-05-03 (×23): qty 2

## 2018-05-03 MED ORDER — SODIUM CHLORIDE 0.9 % IV SOLN: 250.0000 mL | INTRAVENOUS | Status: DC | PRN

## 2018-05-03 MED ORDER — INSULIN ASPART 100 UNIT/ML ~~LOC~~ SOLN
0.0000 [IU] | Freq: Three times a day (TID) | SUBCUTANEOUS | Status: DC
  Administered 2018-05-03: 1 [IU] via SUBCUTANEOUS
  Administered 2018-05-03 – 2018-05-05 (×6): 2 [IU] via SUBCUTANEOUS
  Administered 2018-05-05 – 2018-05-09 (×5): 1 [IU] via SUBCUTANEOUS
  Administered 2018-05-10: 2 [IU] via SUBCUTANEOUS
  Administered 2018-05-11: 1 [IU] via SUBCUTANEOUS
  Administered 2018-05-11: 2 [IU] via SUBCUTANEOUS
  Administered 2018-05-12 – 2018-05-14 (×3): 1 [IU] via SUBCUTANEOUS
  Administered 2018-05-14: 2 [IU] via SUBCUTANEOUS
  Administered 2018-05-15: 1 [IU] via SUBCUTANEOUS
  Administered 2018-05-15 – 2018-05-16 (×3): 2 [IU] via SUBCUTANEOUS
  Administered 2018-05-16: 1 [IU] via SUBCUTANEOUS
  Administered 2018-05-17: 2 [IU] via SUBCUTANEOUS
  Administered 2018-05-17 – 2018-05-19 (×3): 1 [IU] via SUBCUTANEOUS
  Administered 2018-05-20 (×2): 2 [IU] via SUBCUTANEOUS
  Administered 2018-05-20 – 2018-05-21 (×4): 1 [IU] via SUBCUTANEOUS
  Administered 2018-05-22 (×2): 2 [IU] via SUBCUTANEOUS

## 2018-05-03 MED ORDER — FERROUS SULFATE 325 (65 FE) MG PO TABS
325.0000 mg | ORAL_TABLET | Freq: Every day | ORAL | Status: DC
  Administered 2018-05-03 – 2018-05-07 (×5): 325 mg via ORAL
  Filled 2018-05-03 (×5): qty 1

## 2018-05-03 NOTE — Progress Notes (Signed)
Patient's BP was in in the 70s-80s after giving the 517ml bolus over 69mins. MD was paged and placed orders for transfer to [id number]. [facility] arrived at bedside to check on pt. Report was given to nurse on 37M. Pt will be transferred shortly.   Drue Flirt, RN

## 2018-05-03 NOTE — Progress Notes (Signed)
[facility] for Heparin  Indication: atrial fibrillation  No Known Allergies  Patient Measurements: Height: 5\' 7"  (170.2 cm) Weight: 196 lb 6.9 oz (89.1 kg) IBW/kg (Calculated) : 61.6  Vital Signs: Temp: 99.5 F (37.5 C) (06/16 1234) Temp Source: Oral (06/16 1234) BP: 96/75 (06/16 1200) Pulse Rate: 85 (06/16 1200)  Labs: Recent Labs    05/02/18 0043 05/02/18 0604 05/02/18 2314 05/02/18 2326 05/03/18 0427 05/03/18 1108  HGB  --  8.0* 7.4*  --  8.2*  --   HCT  --  25.8* 24.7*  --  27.0*  --   PLT  --  183 167  --  262  --   HEPARINUNFRC 0.34 0.39  --   --  0.31  --   CREATININE  --  4.83* 4.86*  --  5.12*  --   TROPONINI  --   --   --  0.36* 0.35* 0.30*    Assessment: 52 y/o F on Xarelto PTA for afib, was transitioned to heparin while inpatient and peri-operatively. 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 Heparin was resumed s/p procedure and now s/p temp HD cath  Unfortunately, the patient suffered a NSTEMI peri-operatively; therefore, heparin is being continued for dual indications. Appears that the plan is to treat the NSTEMI with medical management for the time being.   Heparin level remains therapeutic at 0.31 but at the lower end of goal range. No bleeding noted.   Goal of Therapy:  Heparin level 0.3-0.7 units/ml aPTT 66-102 seconds Monitor platelets by anticoagulation protocol: Yes   Plan:  Increase heparin gtt to 1600 units/hr to keep in goal range Daily heparin level and CBC  Salome Arnt, PharmD, BCPS Phone #: [phone] until 3pm All other times, call [facility] x [phone] 05/03/2018 1:13 PM

## 2018-05-03 NOTE — Progress Notes (Signed)
Progress Note   Subjective   Doing well today, the patient denies CP or SOB.  No new concerns  Inpatient Medications    Scheduled Meds: . aspirin  81 mg Oral Daily  . chlorhexidine  15 mL Mouth Rinse BID  . Chlorhexidine Gluconate Cloth  6 each Topical Q0600  . docusate sodium  100 mg Oral BID  . ferrous sulfate  325 mg Oral Daily  . hydrocortisone sod succinate (SOLU-CORTEF) inj  50 mg Intravenous Q6H  . insulin aspart  0-5 Units Subcutaneous QHS  . insulin aspart  0-9 Units Subcutaneous TID WC  . mouth rinse  15 mL Mouth Rinse q12n4p  . pantoprazole  40 mg Oral Q1200  . rosuvastatin  20 mg Oral q1800  . sodium chloride flush  10-40 mL Intracatheter Q12H   Continuous Infusions: . sodium chloride 10 mL/hr at 05/02/18 0400  . sodium chloride    . sodium chloride    . heparin 1,550 Units/hr (05/03/18 7893)  . phenylephrine (NEO-SYNEPHRINE) Adult infusion 25 mcg/min (05/03/18 0930)   PRN Meds: sodium chloride, sodium chloride, sodium chloride, acetaminophen, alteplase, fentaNYL (SUBLIMAZE) injection, heparin, nitroGLYCERIN, ondansetron (ZOFRAN) IV, oxyCODONE, sodium chloride flush   Vital Signs    Vitals:   05/03/18 0800 05/03/18 0820 05/03/18 0900 05/03/18 1000  BP: 96/74  112/82 92/67  Pulse: 83  87 94  Resp: 15  10 17   Temp:  98.7 F (37.1 C)    TempSrc:  Oral    SpO2: 97%  97% 91%  Weight:      Height:        Intake/Output Summary (Last 24 hours) at 05/03/2018 1139 Last data filed at 05/03/2018 1000 Gross per 24 hour  Intake 699.46 ml  Output 165 ml  Net 534.46 ml   Filed Weights   05/02/18 1525 05/02/18 1529 05/03/18 0500  Weight: 194 lb 0.1 oz (88 kg) 194 lb 0.1 oz (88 kg) 196 lb 6.9 oz (89.1 kg)    Telemetry    Sinus rhythm - Personally Reviewed  Physical Exam   GEN- The patient is ill appearing, alert and oriented x 3 today.   Head- normocephalic, atraumatic Eyes-  Sclera clear, conjunctiva pink Ears- hearing intact Oropharynx- clear Neck-  supple, Lungs- Clear to ausculation bilaterally, normal work of breathing Heart- Regular rate and rhythm  GI- soft, NT, ND, + BS Extremities- no clubbing, cyanosis, or edema  MS- no significant deformity or atrophy Skin- no rash or lesion Psych- euthymic mood, full affect Neuro- strength and sensation are intact   Labs    Chemistry Recent Labs  Lab 05/02/18 0604 05/02/18 2314 05/03/18 0427  NA 137 138 135  K 3.6 4.3 3.8  CL 99* 100* 100*  CO2 29 27 23   GLUCOSE 82 123* 99  BUN 30* 31* 31*  CREATININE 4.83* 4.86* 5.12*  CALCIUM 7.9* 7.9* 8.0*  PROT  --   --  5.4*  ALBUMIN  --   --  1.5*  AST  --   --  14*  ALT  --   --  7*  ALKPHOS  --   --  103  BILITOT  --   --  0.4  GFRNONAA 9* 9* 9*  GFRAA 11* 11* 10*  ANIONGAP 9 11 12      Hematology Recent Labs  Lab 05/02/18 0604 05/02/18 2314 05/03/18 0427  WBC 15.0* 13.3* 17.3*  RBC 3.24* 3.00* 3.35*  HGB 8.0* 7.4* 8.2*  HCT 25.8* 24.7* 27.0*  MCV  79.6 82.3 80.6  MCH 24.7* 24.7* 24.5*  MCHC 31.0 30.0 30.4  RDW 17.8* 18.0* 18.2*  PLT 183 167 262    Cardiac Enzymes Recent Labs  Lab 04/30/18 1440 04/30/18 2045 05/02/18 2326 05/03/18 0427  TROPONINI 0.91* 0.68* 0.36* 0.35*   No results for input(s): TROPIPOC in the last 168 hours.     Patient Profile     52 y.o. female with PAD, paroxysmal atrial fibrillation, diabetes, and chronic limb ischemia here with complications of L femoral to PTA bypass.  She was admitted for redo L fem to PTA bypass 03/2018 then developed occlusion requiring thrombectomy.  On 6/3 she had re-occlusion, was hypotensive and febrile but declined admission b/c she wanted to go on a camping trip.  Presented to the ED 6/7 with sepsis requiring pressors.  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  Cardiology consulted for NSTEMI and acute systolic and diastolic heart failure.     Assessment & Plan    1.  Acute systolic and diastolic dysfunction Plans for cath on Monday discussed Will place orders and place on  cath board.  She is quite ill, still on pressors.  We will need to look at her early tomorrow to make sure she is ready.  2. Paroxysmal arial fibrillation Maintaining sinus rhythm Anticoagulation on hold pending cath Given renal failure, may require coumadin rather than DOAC  3. AKI Due to ATN Receiving HD  4. Sepsis Improving  Cardiology to follow.  Thompson Grayer MD, [facility] 05/03/2018 11:39 AM

## 2018-05-03 NOTE — Progress Notes (Signed)
Admit: 04/24/2018 LOS: 62  78F with anuric AKI related to ATN from septic shock, ischemic/gangrenous LLL s/p L AKA  Subjective:   Transfer out of ICU yesterday, then developed RVR and hypotension with MTP, back to ICU, on PE gtt  AM Labs potassium 3.8, BUN 31, bicarbonate 23, creatinine 5.1  Not much UOP  Last HD on 6/14   06/15 0701 - 06/16 0700 In: 596.5 [I.V.:536.5; IV Piggyback:60] Out: 165 [Urine:165]  Filed Weights   05/02/18 1525 05/02/18 1529 05/03/18 0500  Weight: 88 kg (194 lb 0.1 oz) 88 kg (194 lb 0.1 oz) 89.1 kg (196 lb 6.9 oz)    Scheduled Meds: . aspirin  81 mg Oral Daily  . chlorhexidine  15 mL Mouth Rinse BID  . Chlorhexidine Gluconate Cloth  6 each Topical Daily  . Chlorhexidine Gluconate Cloth  6 each Topical Q0600  . docusate sodium  100 mg Oral BID  . ferrous sulfate  325 mg Oral Daily  . insulin aspart  0-15 Units Subcutaneous Q4H  . mouth rinse  15 mL Mouth Rinse q12n4p  . rosuvastatin  20 mg Oral q1800  . sodium chloride flush  10-40 mL Intracatheter Q12H   Continuous Infusions: . sodium chloride 10 mL/hr at 05/02/18 0400  . sodium chloride    . sodium chloride    . famotidine (PEPCID) IV Stopped (05/02/18 1123)  . heparin 1,550 Units/hr (05/03/18 2542)  . phenylephrine (NEO-SYNEPHRINE) Adult infusion 32 mcg/min (05/03/18 0646)   PRN Meds:.sodium chloride, sodium chloride, sodium chloride, alteplase, haloperidol lactate, heparin, nitroGLYCERIN, ondansetron (ZOFRAN) IV, sodium chloride flush  Current Labs: reviewed    Physical Exam:  Blood pressure 97/70, pulse 84, temperature 97.8 F (36.6 C), temperature source Oral, resp. rate 11, height 5\' 7"  (1.702 m), weight 89.1 kg (196 lb 6.9 oz), SpO2 96 %. GEN: Chronically ill-appearing, ENT: NCAT, poor dentition EYES: EOMI CV: RRR, no rub, normal S1 and S2 PULM: CTAB anteriorly ABD: Soft, nontender SKIN: Left stump bandaged EXT: Right lower extremity without edema R IJ Temp HD cath  present  A 1. Anuric AKI from ATN, normal baseline SCr 1. Started HD 04/29/18 2. No evidence for recovery at this time 3. Has R IJ Temp HD cath [facility] 04/28/18  2. 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 Septic shock, back on pressors; on hydrocortisone 4. DM2 5. NSTEMI, cardiology following new depressed LVEF and RWMA on 6/11 TTE; needs LHC 6. Hx/o AFib; RVR 7. VDRF, extubated   P 1. HD tentatively on Monday; will need reassessment prior given req pressors currently   2. Daily weights, Daily Renal Panel, Strict I/Os, Avoid nephrotoxins (NSAIDs, judicious IV Contrast)  Medication Issues;  Preferred narcotic agents for pain control are hydromorphone, fentanyl, and methadone. Morphine should not be used. Oxydone is not preferred.   Baclofen should not be used.  Avoid Fleets and Magnesium Citrate Bowel Preps  Pearson Grippe MD 05/03/2018, 7:09 AM  Recent Labs  Lab 05/01/18 0500 05/02/18 0604 05/02/18 2314  NA 134* 137 138  K 3.3* 3.6 4.3  CL 98* 99* 100*  CO2 27 29 27   GLUCOSE 129* 82 123*  BUN 57* 30* 31*  CREATININE 7.04* 4.83* 4.86*  CALCIUM 7.4* 7.9* 7.9*   Recent Labs  Lab 05/02/18 0604 05/02/18 2314 05/03/18 0427  WBC 15.0* 13.3* 17.3*  NEUTROABS  --   --  13.1*  HGB 8.0* 7.4* 8.2*  HCT 25.8* 24.7* 27.0*  MCV 79.6 82.3 80.6  PLT 183  167 262             

## 2018-05-03 NOTE — Progress Notes (Addendum)
Received pt from [facility], pt confused to place and time, sbp 90, map > 65, neosynephrine drip @20mcg , heparin drip @1550  units/hr. LIJ central line and RIJ hemodialysis catheter intact. Dr Jimmey Ralph to bedside for assessment.

## 2018-05-03 NOTE — Progress Notes (Signed)
[facility] Progress Note Patient Name: [name] DOB: [birth year] MRN: [id number]   Date of Service  05/03/2018  HPI/Events of Note  Delirium - Request for sedation and bilateral soft wrist restraints. QTc = 360 milliseconds.   eICU Interventions  Will order: 1. Haldol 2 mg IV Q 3 hours PRN agitation.  2. Bilateral soft wrist restraints. 3. Monitor QTc interval Q 6 hours. Notify MD if QTc interval > 500 milliseconds.     Intervention Category Major Interventions: Delirium, psychosis, severe agitation - evaluation and management  Sommer,Steven Eugene 05/03/2018, 4:08 AM

## 2018-05-03 NOTE — Progress Notes (Signed)
PULMONARY / CRITICAL CARE MEDICINE   Name: [name] MRN: [id number] DOB: December 11, 1965    ADMISSION DATE:  04/24/2018 RE-CONSULTATION DATE: 05/02/18  CHIEF COMPLAINT:  Shock  HISTORY OF PRESENT ILLNESS:   52 year old female with PMH of DM, PAD, A.Fib on Xarelto, Chronic Wounds of Left Foot with H/O of ABF and Fem-Pop Bypass Recent Admission 5/10-5/14 for redo of Left femoral to PTA bypass with cryopreserved vein. Post-Op patient lost pulse to left foot and was taken back to OR for thrombectomy. On 6/3 patient went to outpatient vascular appointment where she was found to by hypotensive and febrile with a occlusion noted to bypass graft. Patient was told at this time to got to ED however, she was going on a camping trip in which she did not want to miss. Sent home with clindamycin. On 6/7 patient presented to ED with AMS, Pain to LLE, and N/V/D, found to be in septic shock requiring vasopressors and broad spectrum antibiotcs. LLE with no pulse and gangrenous. Vascular Consulted and performed Left AKA on 6/9. Course has been complicated by AKI requiring HD and NSTEMI. Also has developed acute systolic and diastolic heart failure for which cardiology is following and planning cardiac cath.   On 6/15 at 4pm patient noted to be in Afib with RVR to 130's. Cards APP gave Metoprolol 2.5mg  IV x 1, after which patient developed hypotension to 70/30. Patient received multiple IVF boluses over the course of the day but her BP remained low. PCCM consulted. Patient started on Neo gtt and transferred back to ICU. At time of my exam patient awake/alert, talkative, and in NAD. Denies CP or SOB.   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  PAST SURGICAL HISTORY: She  has a past surgical history that includes Skin graft (Right, 2012); Back surgery; Tonsillectomy; Spine surgery; Cholecystectomy; Abdominal aortagram (June 15, 2014); Intercostal nerve block (November 2015); abdominal aortagram (N/A, 06/15/2014); abdominal aortagram (N/A, 11/22/2014); Cystectomy (Right); Cystectomy (Left); left foot surgery; left wrist cyst removal (Left); Cardiac catheterization (N/A, 05/07/2016); Cardiac catheterization (N/A, 05/07/2016); Cardiac catheterization (N/A, 05/07/2016); Cardiac catheterization (N/A, 05/07/2016); Cardiac catheterization (Right, 05/07/2016); Cardiac catheterization (Right, 05/07/2016); Cardiac catheterization; Irrigation and debridement buttocks (Right, 09/30/2016); ABDOMINAL AORTOGRAM W/LOWER EXTREMITY (N/A, 01/07/2017); IR Fluoro Guide CV Line Right (03/20/2017); IR US Guide Vasc Access Right (03/20/2017); ABDOMINAL AORTOGRAM W/LOWER EXTREMITY (N/A, 10/31/2017); AORTIC ARCH ANGIOGRAPHY (N/A, 10/31/2017); Embolectomy (Left, 11/28/2017); Aorta - bilateral femoral artery bypass graft (N/A, 11/28/2017); Application if wound vac (11/28/2017); Intraoperative arteriogram (11/28/2017); Thrombectomy femoral artery (11/28/2017); ABDOMINAL AORTOGRAM W/LOWER EXTREMITY (N/A, 03/24/2018); Femoral-tibial Bypass Graft (Left, 03/27/2018); Application if wound vac (Left, 03/27/2018); Femoral-popliteal Bypass Graft (Left, 03/27/2018); Intraoperative arteriogram (Left, 03/27/2018); ARTERIAL BYPASS SURGERY  (07/05/2010); and Amputation (Left, 04/26/2018).  No Known Allergies  No current facility-administered medications on file prior to encounter.    Current Outpatient Medications on File Prior to  Encounter  Medication Sig  . albuterol (PROVENTIL) 2 MG tablet Take 2 mg by mouth 3 (three) times daily as needed for shortness of breath.   Marland Kitchen aspirin EC 81 MG tablet  Take 81 mg by mouth every evening.  . cetirizine (ZYRTEC) 10 MG tablet Take 10 mg by mouth daily.   Marland Kitchen HUMALOG KWIKPEN 100 UNIT/ML KiwkPen Inject 10 Units into the skin See admin instructions. 10 units three times a day before meals per sliding scale  . HYDROcodone-acetaminophen (NORCO) 10-325 MG tablet Take 1 tablet by mouth 2 (two) times daily as needed for severe pain.   Marland Kitchen LANTUS SOLOSTAR 100 UNIT/ML Solostar Pen Inject 36 Units into the skin 2 (two) times daily.   Marland Kitchen loperamide (IMODIUM A-D) 2 MG tablet Take 12 mg by mouth daily as needed for diarrhea or loose stools.  . metoprolol tartrate (LOPRESSOR) 25 MG tablet Take 0.5 tablets (12.5 mg total) by mouth 2 (two) times daily.  Marland Kitchen omeprazole (PRILOSEC) 20 MG capsule Take 20 mg by mouth daily.  . ondansetron (ZOFRAN-ODT) 4 MG disintegrating tablet DISSOLVE 1 TABLET IN MOUTH EVERY 8 HOURS AS NEEDED FOR NAUSEA OR VOMITING  . pregabalin (LYRICA) 100 MG capsule Limit 1 tab by mouth twice a day to 3 times a day if tolerated (Patient taking differently: Take 100 mg by mouth 2 (two) times daily. )  . rivaroxaban (XARELTO) 20 MG TABS tablet Take 1 tablet (20 mg total) by mouth daily with supper.  . rosuvastatin (CRESTOR) 20 MG tablet Take 1 tablet (20 mg total) by mouth daily at 6 PM.   FAMILY HISTORY:  Her indicated that her mother is deceased. She indicated that her father is deceased. She indicated that the status of her sister is unknown. She indicated that the status of her brother is unknown. She indicated that her maternal grandmother is deceased. She indicated that her paternal grandmother is deceased. She indicated that her paternal grandfather is deceased.  SOCIAL HISTORY: She  reports that she quit smoking about 14 months ago. Her smoking use included cigarettes. She quit after  30.00 years of use. She has never used smokeless tobacco. She reports that she does not drink alcohol or use drugs.  REVIEW OF SYSTEMS:   Review of Systems  Constitutional: Negative.   HENT: Negative.   Eyes: Negative.   Respiratory: Negative.   Cardiovascular: Negative.   Gastrointestinal: Positive for nausea.  Genitourinary: Negative.   Musculoskeletal: Negative.   Skin: Negative.   Neurological: Negative.   Endo/Heme/Allergies: Negative.   Psychiatric/Behavioral: Negative.    SUBJECTIVE:  Lying on ICU stretcher, awake/alert, talkative  VITAL SIGNS: BP 94/78   Pulse 89   Temp 97.8 F (36.6 C) (Oral)   Resp 14   Ht 5\' 7"  (1.702 m)   Wt 88 kg (194 lb 0.1 oz)   SpO2 97%   BMI 30.39 kg/m   HEMODYNAMICS:  Neo @ 8mcg  INTAKE / OUTPUT: I/O last 3 completed shifts: In: 1222.1 [I.V.:745.4; NG/GT:325; IV Piggyback:151.7] Out: 3807 [Urine:207; Other:3200; Stool:400]  PHYSICAL EXAMINATION: General: WDWN Adult female, Chronically ill-appearing, in NAD Neuro: Awake/alert, PERRL, moving all extremities, answering questions and obeying commands  HEENT: OP clear, MM moist  Cardiovascular: Irreg irreg with HR in 90's Lungs: CTA b/l Abdomen: Soft obese, mildly TTP in RLQ, no g/r, BS hypoactive Musculoskeletal: Left AKA (bandaged), no [name] edema  Skin: no rashes   LABS:  BMET Recent Labs  Lab 05/01/18 0500 05/02/18 0604 05/02/18 2314  NA 134* 137 138  K 3.3* 3.6 4.3  CL 98* 99* 100*  CO2 27 29 27   BUN 57* 30* 31*  CREATININE 7.04* 4.83* 4.86*  GLUCOSE 129* 82 123*   Electrolytes  Recent Labs  Lab 04/26/18 0404  04/27/18 0435 04/28/18 0500 04/29/18 0320  05/01/18 0500 05/02/18 0604 05/02/18 2314  CALCIUM 7.6*   < > 8.0* 7.6* 7.0*   < > 7.4* 7.9* 7.9*  MG 1.1*  --  1.4* 1.5* 1.7  --   --   --   --   PHOS 7.9*  --   --   --   --   --   --   --   --    < > = values in this interval not displayed.   CBC Recent Labs  Lab 05/01/18 0500 05/02/18 0604  05/02/18 2314  WBC 11.4* 15.0* 13.3*  HGB 8.3* 8.0* 7.4*  HCT 25.7* 25.8* 24.7*  PLT 181 183 167   Coag's Recent Labs  Lab 04/27/18 1423 04/28/18 0500  APTT 72* 96*   Sepsis Markers Recent Labs  Lab 04/26/18 2108 04/27/18 0427  04/30/18 0952 05/01/18 0500 05/02/18 0604 05/02/18 2314  LATICACIDVEN 0.7 0.8  --   --   --   --  0.8  PROCALCITON  --   --    < > 0.75 0.66 0.51  --    < > = values in this interval not displayed.   ABG Recent Labs  Lab 04/28/18 1756 04/29/18 0324  PHART 7.452* 7.454*  PCO2ART 39.1 37.8  PO2ART 178.0* 149.0*   Liver Enzymes No results for input(s): AST, ALT, ALKPHOS, BILITOT, ALBUMIN in the last 168 hours.  Cardiac Enzymes Recent Labs  Lab 04/30/18 0952 04/30/18 1440 04/30/18 2045  TROPONINI 1.14* 0.91* 0.68*   Glucose Recent Labs  Lab 05/01/18 2325 05/02/18 0327 05/02/18 0724 05/02/18 1137 05/02/18 1614 05/02/18 2014  GLUCAP 83 78 75 70 92 73   Imaging Dg Chest Port 1 View  Result Date: 05/02/2018 CLINICAL DATA:  Pulmonary edema. EXAM: PORTABLE CHEST 1 VIEW COMPARISON:  05/01/2018. FINDINGS: Endotracheal tube and nasogastric tube have been removed. Right IJ central line tip projects over the SVC. Left IJ central line tip also projects over the SVC. Heart size stable. Mild diffuse interstitial prominence and indistinctness with improvement in aeration in the lung bases. Small bilateral pleural effusions. IMPRESSION: Improving congestive heart failure. Electronically Signed   By: Lorin Picket M.D.   On: 05/02/2018 07:18   STUDIES:  ECHO 6/11 >> EF 30 to 35%  CULTURES: Blood 6/7 >>negative UC 6/7 >>negative   ANTIBIOTICS: Vancomycin 6/7 >>6/11 Zosyn 6/7 >>6/11 Ceftazidime 6/11>>6/14  SIGNIFICANT EVENTS: 6/07 Presents to ED  6/09 Emergent Left AKA per Vascular surgery 6/11 Nephrology consulted for worsening oliguric renal failure, and metabolic acidosis  2/40 1st run of HD 6/13 Telemetry with new ST depression  and T wave inversion 6/15  Transfer to tele 6/15 PM: transfer back to ICU following hypotension that developed after metoprolol given  LINES/TUBES: L IJ CVL 6/08 >> R IJ Temporary HD cath 6/11 >> ETT 6/11 >> 6/14  DISCUSSION: 52 year old female with PMH of DM, PAD, A.Fib on Xarelto, Chronic Wounds of Left Foot with H/O of ABF and Fem-Pop Bypass, s/p admission 5/10-5/14 for redo of Left femoral to PTA bypass with cryopreserved vein. Post-Op patient lost pulse to left foot and was taken back to OR for thrombectomy. Admit 6/7 with septic shock due to LLE gangrene, s/p left AKA 6/9. Course has been complicated by AKI requiring HD and NSTEMI. Also has developed acute systolic and diastolic heart failure for which cardiology is following and planning cardiac cath.   On 6/15  at 4pm patient noted to be in Afib with RVR to 130's. Cards APP gave Metoprolol 2.5mg  IV x 1, after which patient developed hypotension to 70/30. Patient received multiple IVF boluses over the course of the day but her BP remained low. PCCM consulted. Patient started on Neo gtt and transferred back to ICU.    ASSESSMENT / PLAN:  PULMONARY 1. Acute hypoxic Respiratory failure: - continue 3L O2 and wean as tolerated  CARDIOVASCULAR 1. Shock - avoid any further beta blockers - received 2L IVF bolus over the course of the afternoon/night - check CVP - check cortisol level; check procal and lactate although low suspicion for sepsis - continue Neo gtt as needed for MAP goal > 65  2. Afib with RVR; CHF; NSTEMI - was in RVR earlier today for which she received metoprolol. Currently is rate-controlled. If developed RVR again will need Amiodarone. Would avoid beta-blockers as she developed hypotension today following a small dose - new EKG on my review shows no ST changes; check troponin - minimize IVF boluses as possible given her new diagnosis of congestive heart failure - continue heparin gtt, ASA, crestor - cards planning  LHC next week  3. PAD: - s/p leg gangrene complicating fem-pop bypass, now s/p left AKA - continue wound care - per Vascular surgery  RENAL 1. AKI: due to ATN - continued HD per Renal  GASTROINTESTINAL No active issues;   HEMATOLOGIC 1. Anemia: due to anemia of chronic disease and due to her renal failure: - Hgb currently 7.4, trending down slowly from 9>8>7.4 over the past week.  - continue ferrous sulfate daily (on bowel regimen); transfuse for Hgb <7.0  INFECTIOUS 1. LLE Gangrene with Septic shock: - s/p LLE AKA, completed antibiotics, septic shock has since resolved  ENDOCRINE 1. DM: - NPO now given new shock - continue SSI   NEUROLOGIC No active issues; avoid sedatives   FAMILY  - Updates: no family present in ICU at time of my eval - Inter-disciplinary family meet or Palliative Care meeting due by: 05/09/18   60 minutes critical care time  Vernie Murders, MD  Pulmonary and [place] Pager: [phone]  05/03/2018, 12:11 AM

## 2018-05-03 NOTE — Significant Event (Signed)
Received a call from CCM NP. Patient had transfer orders to move back the ICU for hypotension. Orders for placed for labs and initiation of Neo-Synephrine.   Upon arrival, SBP was in the 70-80s with MAPS 50s-60s. Per RN, after further fluid administration, patient's blood pressure has still not improved. I accessed the central line and had labs drawn and started the Neo-Syn at 50mcg at 2320 and at 2325 BP improved to 100/62 (73). Patient was transferred to 3M07.   Start Time 2300 Arrival Time 2302 End Time 2345

## 2018-05-03 NOTE — Progress Notes (Signed)
PULMONARY / CRITICAL CARE MEDICINE   Name: [name] MRN: [id number] DOB: Jan 01, 1966    ADMISSION DATE:  04/24/2018 CONSULTATION DATE:  04/25/2018  REFERRING MD:  Dr. Berline Lopes   CHIEF COMPLAINT:  Sepsis   HISTORY OF PRESENT ILLNESS:   52 yo female with chronic Lt foot wound s/p aorto-bifemoral and fem-pop bypass had persistent limb ischemia.  Found to have fever, hypotension, and occlusion of bypass graft.  She opted for outpt ABx.  Presented to ER with altered mental status, Lt leg pain from gangrene of Lt foot with septic shock needing Lt AKA.  PAST MEDICAL HISTORY: DM, PAD, A fib on xarelto, HLD, GERD, Neuropathy, Depression, CAD  SUBJECTIVE:  Dropped BS last night after getting lopressor.  Back to ICU and on pressors.  Denies chest pain, nausea, dyspnea.  VITAL SIGNS: BP 96/74   Pulse 83   Temp 97.8 F (36.6 C) (Oral)   Resp 15   Ht 5\' 7"  (1.702 m)   Wt 196 lb 6.9 oz (89.1 kg)   SpO2 97%   BMI 30.77 kg/m   INTAKE / OUTPUT: I/O last 3 completed shifts: In: 902.5 [I.V.:842.5; IV Piggyback:60] Out: 317 [Urine:317]  PHYSICAL EXAMINATION:  General - pleasant Eyes - pupils reactive ENT - no sinus tenderness, no oral exudate, no LAN Cardiac - irregular, no murmur Chest - no wheeze, rales Abd - soft, non tender Ext - Lt AKA Skin - no rashes Neuro - normal strength Psych - normal mood  LABS:  BMET Recent Labs  Lab 05/02/18 0604 05/02/18 2314 05/03/18 0427  NA 137 138 135  K 3.6 4.3 3.8  CL 99* 100* 100*  CO2 29 27 23   BUN 30* 31* 31*  CREATININE 4.83* 4.86* 5.12*  GLUCOSE 82 123* 99    Electrolytes Recent Labs  Lab 04/27/18 0435 04/28/18 0500 04/29/18 0320  05/02/18 0604 05/02/18 2314 05/03/18 0427  CALCIUM 8.0* 7.6* 7.0*   < > 7.9* 7.9* 8.0*  MG 1.4* 1.5* 1.7  --   --   --   --    < > = values in this interval not displayed.    CBC Recent Labs  Lab 05/02/18 0604 05/02/18 2314 05/03/18 0427  WBC 15.0* 13.3* 17.3*  HGB 8.0* 7.4* 8.2*   HCT 25.8* 24.7* 27.0*  PLT 183 167 262    Coag's Recent Labs  Lab 04/27/18 1423 04/28/18 0500  APTT 72* 96*    Sepsis Markers Recent Labs  Lab 04/26/18 2108 04/27/18 0427  05/01/18 0500 05/02/18 0604 05/02/18 2314  LATICACIDVEN 0.7 0.8  --   --   --  0.8  PROCALCITON  --   --    < > 0.66 0.51 0.35   < > = values in this interval not displayed.    ABG Recent Labs  Lab 04/28/18 1756 04/29/18 0324  PHART 7.452* 7.454*  PCO2ART 39.1 37.8  PO2ART 178.0* 149.0*    Liver Enzymes Recent Labs  Lab 05/03/18 0427  AST 14*  ALT 7*  ALKPHOS 103  BILITOT 0.4  ALBUMIN 1.5*    Cardiac Enzymes Recent Labs  Lab 04/30/18 2045 05/02/18 2326 05/03/18 0427  TROPONINI 0.68* 0.36* 0.35*    Glucose Recent Labs  Lab 05/02/18 1137 05/02/18 1614 05/02/18 2014 05/03/18 0100 05/03/18 0435 05/03/18 0818  GLUCAP 70 92 73 102* 90 107*    Imaging No results found.   STUDIES:  ECHO 6/11 >> EF 30 to 35%  CULTURES: Blood 6/7 >>negative UC 6/7 >>  negative   ANTIBIOTICS: Vancomycin 6/7 >>6/11 Zosyn 6/7 >>6/11 Ceftazidime 6/11>>6/14  SIGNIFICANT EVENTS: 6/07 Presents to ED  6/09 Emergent Left AKA per Vascular surgery 6/11 Nephrology consulted for worsening oliguric renal failure, and metabolic acidosis  6/00 1st run of HD 6/13 Telemetry with new ST depression and T wave inversion 6/15 Transfer to tele 6/16 Dropped BP after getting lopressor >> back to ICU and started on pressors  LINES/TUBES: L IJ CVL 6/08 >> R IJ Temporary HD cath 6/11 >> ETT 6/11 >> 6/14  DISCUSSION: 52 yo with septic shock from Lt foot gangrene s/p AKA.  Post op course complicated by VDRF, altered mental status, CHF, A fib, AKI.  Hypotensive after getting lopressor 6/15.  ASSESSMENT / PLAN:  NSTEMI. HFrEF with acute systolic CHF. A fib with RVR. HLD. Hypotension from meds. - heparin gtt, ASA, crestor - cards planning for LHC next week - pressors to keep MAP > 65  Sepsis from  Lt foot gangrene s/p AKA. - wound care  Relative adrenal insufficiency. - cortisol 19.1 from 6/15 - add solu cortef  AKI from ATN. - intermittent HD per renal  Anemia of critical illness and chronic disease. - f/u CBC  DM type II. - SSI  Dysphagia. - D3 diet  DVT prophylaxis - heparin gtt SUP - protonix Nutrition - D3 diet Goals of care - full code  Updated family at bedside  Chesley Mires, MD [place] 05/03/2018, 8:25 AM

## 2018-05-04 ENCOUNTER — Encounter (HOSPITAL_COMMUNITY)
Admission: EM | Disposition: A | Discharge status: Rehabilitation Facility | Payer: Self-pay | Source: Home / Self Care | Attending: Family Medicine

## 2018-05-04 DIAGNOSIS — I5041 Acute combined systolic (congestive) and diastolic (congestive) heart failure: Secondary | ICD-10-CM

## 2018-05-04 DIAGNOSIS — I214 Non-ST elevation (NSTEMI) myocardial infarction: Secondary | ICD-10-CM

## 2018-05-04 LAB — CBC
HEMATOCRIT: 25.4 % — AB (ref 36.0–46.0)
Hemoglobin: 7.7 g/dL — ABNORMAL LOW (ref 12.0–15.0)
MCH: 24.5 pg — AB (ref 26.0–34.0)
MCHC: 30.3 g/dL (ref 30.0–36.0)
MCV: 80.9 fL (ref 78.0–100.0)
Platelets: 227 10*3/uL (ref 150–400)
RBC: 3.14 MIL/uL — AB (ref 3.87–5.11)
RDW: 18.2 % — ABNORMAL HIGH (ref 11.5–15.5)
WBC: 10.2 10*3/uL (ref 4.0–10.5)

## 2018-05-04 LAB — BASIC METABOLIC PANEL
Anion gap: 12 (ref 5–15)
BUN: 36 mg/dL — AB (ref 6–20)
CO2: 25 mmol/L (ref 22–32)
Calcium: 8.6 mg/dL — ABNORMAL LOW (ref 8.9–10.3)
Chloride: 100 mmol/L — ABNORMAL LOW (ref 101–111)
Creatinine, Ser: 5.65 mg/dL — ABNORMAL HIGH (ref 0.44–1.00)
GFR calc Af Amer: 9 mL/min — ABNORMAL LOW (ref >60)
GFR calc non Af Amer: 8 mL/min — ABNORMAL LOW (ref >60)
GLUCOSE: 195 mg/dL — AB (ref 65–99)
POTASSIUM: 4.6 mmol/L (ref 3.5–5.1)
Sodium: 137 mmol/L (ref 135–145)

## 2018-05-04 LAB — GLUCOSE, CAPILLARY
GLUCOSE-CAPILLARY: 178 mg/dL — AB (ref 65–99)
GLUCOSE-CAPILLARY: 183 mg/dL — AB (ref 65–99)
Glucose-Capillary: 186 mg/dL — ABNORMAL HIGH (ref 65–99)
Glucose-Capillary: 178 mg/dL — ABNORMAL HIGH (ref 65–99)

## 2018-05-04 LAB — PROCALCITONIN: Procalcitonin: 0.25 ng/mL

## 2018-05-04 LAB — HEPARIN LEVEL (UNFRACTIONATED): Heparin Unfractionated: 0.56 IU/mL (ref 0.30–0.70)

## 2018-05-04 SURGERY — LEFT HEART CATH AND CORONARY ANGIOGRAPHY
Anesthesia: LOCAL

## 2018-05-04 MED ORDER — SODIUM CHLORIDE 0.9 % WEIGHT BASED INFUSION
50.0000 mL/h | INTRAVENOUS | Status: DC
  Administered 2018-05-04 (×2): 50 mL/h via INTRAVENOUS

## 2018-05-04 MED ORDER — RENA-VITE PO TABS
1.0000 | ORAL_TABLET | Freq: Every day | ORAL | Status: DC
  Administered 2018-05-04 – 2018-05-21 (×18): 1 via ORAL
  Filled 2018-05-04 (×18): qty 1

## 2018-05-04 MED ORDER — ADULT MULTIVITAMIN W/MINERALS CH
1.0000 | ORAL_TABLET | Freq: Every day | ORAL | Status: DC
  Administered 2018-05-04 – 2018-05-05 (×2): 1 via ORAL
  Filled 2018-05-04 (×2): qty 1

## 2018-05-04 NOTE — H&P (Signed)
Chief Complaint: Patient was seen in consultation today for renal failure  Referring Physician(s): Dr. Hollie Salk  Supervising Physician: Marybelle Killings  Patient Status: [facility] - In-pt  History of Present Illness: [name] is a 52 y.o. female with past medical history of CAD, PAD, and sepsis from ichemic lower extremity now s/p amputation 04/26/18 continues with acute renal failure.  Patient had a temporary dialysis catheter in place, however was accidentally pulled.  Patient and team hopeful for renal recovery, however will likely continue to need dialysis in the interim.  Request now made for tunneled dialysis catheter placement.   Past Medical History:  Diagnosis Date  . Abnormal stress test    a. 02/2017 MV: large region of fixed perfusion defect in basal to mid inf, mid-dist inflat walls, EF 43%. No ischemia (EF 55-60% by f/u echo).  . Arthritis   . Asthma   . Chronic back pain   . Coronary artery calcification seen on CT scan    a. 11/2017 CT Abd/Pelvis: Multi vessel coronary vascular Ca2+.  . Depression   . Diabetes mellitus   . Diabetic neuropathy ([place])   . Family history of adverse reaction to anesthesia    mother had difficlty waking   . Femoral-popliteal bypass graft occlusion, left ([place]) 12/02/2017  . GERD (gastroesophageal reflux disease)   . History of echocardiogram    a. 03/2017 Echo: EF 55-60%, mild LVH, nl RV fxn.  . Hyperlipidemia   . 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 PAD (peripheral artery disease) ([place])   . Paroxysmal atrial fibrillation with rapid ventricular response ([place]) 12/02/2017   a. CHA2DS2VASc = 3-->Xarelto.  Marland Kitchen Ulcer    Foot    Past Surgical History:  Procedure Laterality Date  . ABDOMINAL AORTAGRAM  June 15, 2014  . ABDOMINAL AORTAGRAM N/A 06/15/2014   Procedure: ABDOMINAL Maxcine Ham;  Surgeon: Serafina Mitchell, MD;  Location: [facility] CATH LAB;  Service: Cardiovascular;  Laterality: N/A;  . ABDOMINAL AORTAGRAM N/A 11/22/2014   Procedure:  ABDOMINAL AORTAGRAM;  Surgeon: Serafina Mitchell, MD;  Location: [facility] CATH LAB;  Service: Cardiovascular;  Laterality: N/A;  . ABDOMINAL AORTOGRAM W/LOWER EXTREMITY N/A 01/07/2017   Procedure: Abdominal Aortogram w/Lower Extremity;  Surgeon: Serafina Mitchell, MD;  Location: [place] CV LAB;  Service: Cardiovascular;  Laterality: N/A;  . ABDOMINAL AORTOGRAM W/LOWER EXTREMITY N/A 10/31/2017   Procedure: ABDOMINAL AORTOGRAM W/LOWER EXTREMITY;  Surgeon: Elam Dutch, MD;  Location: [place] CV LAB;  Service: Cardiovascular;  Laterality: N/A;  . ABDOMINAL AORTOGRAM W/LOWER EXTREMITY N/A 03/24/2018   Procedure: ABDOMINAL AORTOGRAM W/LOWER EXTREMITY;  Surgeon: Serafina Mitchell, MD;  Location: [place] CV LAB;  Service: Cardiovascular;  Laterality: N/A;  . AMPUTATION Left 04/26/2018   Procedure: AMPUTATION ABOVE KNEE;  Surgeon: Elam Dutch, MD;  Location: [place];  Service: Vascular;  Laterality: Left;  . AORTA - BILATERAL FEMORAL ARTERY BYPASS GRAFT N/A 11/28/2017   Procedure: AORTA BIFEMORAL BYPASS USING HEMASHIELD GOLD GRAFT & REIMPLANT IMA;  Surgeon: Serafina Mitchell, MD;  Location: [facility] OR;  Service: Vascular;  Laterality: N/A;  . AORTIC ARCH ANGIOGRAPHY N/A 10/31/2017   Procedure: AORTIC ARCH ANGIOGRAPHY;  Surgeon: Elam Dutch, MD;  Location: [place] CV LAB;  Service: Cardiovascular;  Laterality: N/A;  . APPLICATION OF WOUND VAC  11/28/2017   Procedure: APPLICATION OF WOUND VAC;  Surgeon: Serafina Mitchell, MD;  Location: [place];  Service: Vascular;;  . APPLICATION OF WOUND VAC Left 03/27/2018  Procedure: APPLICATION OF WOUND VAC LEFT GROIN USING PREVENA PLUS;  Surgeon: Serafina Mitchell, MD;  Location: [place];  Service: Vascular;  Laterality: Left;  . ARTERIAL BYPASS SURGERY   07/05/2010   Right Common Femoral to below knee popliteal BPG  . BACK SURGERY     X's  2  . CARDIAC CATHETERIZATION    . CHOLECYSTECTOMY     Gall Bladder  . CYSTECTOMY Right    foot  . CYSTECTOMY Left     wrist  . EMBOLECTOMY Left 11/28/2017   Procedure: Left Lower Extremity Embolectomy, Left Tibial Peroneal Trunk Endarterectomy with Patch Angioplasty; Vein Harvest Small Saphenous Graft Left Lower Leg;  Surgeon: [name], MD;  Location: [place];  Service: Vascular;  Laterality: Left;  . FEMORAL-POPLITEAL BYPASS GRAFT Left 03/27/2018   Procedure: THROMBECTOMY OF LEFT FEMORAL TIBIAL BYPASS;  Surgeon: Serafina Mitchell, MD;  Location: [place];  Service: Vascular;  Laterality: Left;  . FEMORAL-TIBIAL BYPASS GRAFT Left 03/27/2018   Procedure: BYPASS GRAFT FEMORAL-TIBIAL ARTERY LEFT REDO USING CRYOPRESERVED SAPHENOUS VEIN 70cm;  Surgeon: Serafina Mitchell, MD;  Location: [facility] OR;  Service: Vascular;  Laterality: Left;  . INTERCOSTAL NERVE BLOCK  November 2015  . INTRAOPERATIVE ARTERIOGRAM  11/28/2017   Procedure: INTRA OPERATIVE ARTERIOGRAM OF LEFT LEG;  Surgeon: Serafina Mitchell, MD;  Location: [place];  Service: Vascular;;  . INTRAOPERATIVE ARTERIOGRAM Left 03/27/2018   Procedure: INTRA OPERATIVE ARTERIOGRAM TIMES TWO;  Surgeon: Serafina Mitchell, MD;  Location: [place];  Service: Vascular;  Laterality: Left;  . IR FLUORO GUIDE CV LINE RIGHT  03/20/2017  . IR US GUIDE VASC ACCESS RIGHT  03/20/2017  . IRRIGATION AND DEBRIDEMENT BUTTOCKS Right 09/30/2016   Procedure: DEBRIDEMENT RIGHT  BUTTOCK WOUND;  Surgeon: Georganna Skeans, MD;  Location: [place];  Service: General;  Laterality: Right;  . left foot surgery    . left wrist cyst removal Left   . PERIPHERAL VASCULAR CATHETERIZATION N/A 05/07/2016   Procedure: Abdominal Aortogram;  Surgeon: Serafina Mitchell, MD;  Location: [place] CV LAB;  Service: Cardiovascular;  Laterality: N/A;  . PERIPHERAL VASCULAR CATHETERIZATION N/A 05/07/2016   Procedure: Lower Extremity Angiography;  Surgeon: Serafina Mitchell, MD;  Location: [place] CV LAB;  Service: Cardiovascular;  Laterality: N/A;  . PERIPHERAL VASCULAR CATHETERIZATION N/A 05/07/2016   Procedure: Aortic Arch  Angiography;  Surgeon: Serafina Mitchell, MD;  Location: [place] CV LAB;  Service: Cardiovascular;  Laterality: N/A;  . PERIPHERAL VASCULAR CATHETERIZATION N/A 05/07/2016   Procedure: Upper Extremity Angiography;  Surgeon: Serafina Mitchell, MD;  Location: [place] CV LAB;  Service: Cardiovascular;  Laterality: N/A;  . PERIPHERAL VASCULAR CATHETERIZATION Right 05/07/2016   Procedure: Peripheral Vascular Balloon Angioplasty;  Surgeon: Serafina Mitchell, MD;  Location: [place] CV LAB;  Service: Cardiovascular;  Laterality: Right;  subclavian  . PERIPHERAL VASCULAR CATHETERIZATION Right 05/07/2016   Procedure: Peripheral Vascular Intervention;  Surgeon: Serafina Mitchell, MD;  Location: [place] CV LAB;  Service: Cardiovascular;  Laterality: Right;  External  Iliac  . SKIN GRAFT Right 2012   [name] by Dr. Nils Pyle- Right and Left Ankle  . SPINE SURGERY    . THROMBECTOMY FEMORAL ARTERY  11/28/2017   Procedure: THROMBECTOMY  & REVISION OF BILATERAL FEMORAL TO POPLETEAL ARTERIES;  Surgeon: Serafina Mitchell, MD;  Location: [facility] OR;  Service: Vascular;;  . TONSILLECTOMY      Allergies: Lactose intolerance (gi)  Medications: Prior to Admission medications  Medication Sig Start Date End Date Taking? Authorizing Provider  albuterol (PROVENTIL) 2 MG tablet Take 2 mg by mouth 3 (three) times daily as needed for shortness of breath.    Yes [provider]  aspirin EC 81 MG tablet Take 81 mg by mouth every evening.   Yes [provider]  cetirizine (ZYRTEC) 10 MG tablet Take 10 mg by mouth daily.    Yes [provider]  HUMALOG KWIKPEN 100 UNIT/ML KiwkPen Inject 10 Units into the skin See admin instructions. 10 units three times a day before meals per sliding scale 11/11/15  Yes [provider]  HYDROcodone-acetaminophen (NORCO) 10-325 MG tablet Take 1 tablet by mouth 2 (two) times daily as needed for severe pain.  01/07/18  Yes [provider]  LANTUS SOLOSTAR 100  UNIT/ML Solostar Pen Inject 36 Units into the skin 2 (two) times daily.  09/15/14  Yes [provider]  loperamide (IMODIUM A-D) 2 MG tablet Take 12 mg by mouth daily as needed for diarrhea or loose stools.   Yes [provider]  metoprolol tartrate (LOPRESSOR) 25 MG tablet Take 0.5 tablets (12.5 mg total) by mouth 2 (two) times daily. 01/14/18 04/24/18 Yes End, Harrell Gave, MD  omeprazole (PRILOSEC) 20 MG capsule Take 20 mg by mouth daily.   Yes [provider]  ondansetron (ZOFRAN-ODT) 4 MG disintegrating tablet DISSOLVE 1 TABLET IN MOUTH EVERY 8 HOURS AS NEEDED FOR NAUSEA OR VOMITING 04/02/18  Yes [name], MD  pregabalin (LYRICA) 100 MG capsule Limit 1 tab by mouth twice a day to 3 times a day if tolerated Patient taking differently: Take 100 mg by mouth 2 (two) times daily.  07/18/16  Yes Mohammed Kindle, MD  rivaroxaban (XARELTO) 20 MG TABS tablet Take 1 tablet (20 mg total) by mouth daily with supper. 04/08/18  Yes Ulyses Amor, PA-C  rosuvastatin (CRESTOR) 20 MG tablet Take 1 tablet (20 mg total) by mouth daily at 6 PM. 12/09/17  Yes Ulyses Amor, PA-C     Family History  Problem Relation Age of Onset  . Coronary artery disease Mother   . Peripheral vascular disease Mother   . Heart disease Mother        Before age 89  . Other Mother        Venous insuffiency  . Diabetes Mother   . Hyperlipidemia Mother   . Hypertension Mother   . Varicose Veins Mother   . Heart attack Mother        before age 82  . Heart disease Father   . Diabetes Father   . Diabetes Maternal Grandmother   . Diabetes Paternal Grandmother   . Diabetes Paternal Grandfather   . Diabetes Sister   . Hypertension Sister   . Diabetes Brother   . Hypertension Brother     Social History   Socioeconomic History  . Marital status: Married    Spouse name: Not on file  . Number of children: Not on file  . Years of education: Not on file  . Highest education level: Not  on file  Occupational History  . Occupation: disabled  Social Needs  . Financial resource strain: Not on file  . Food insecurity:    Worry: Not on file    Inability: Not on file  . Transportation needs:    Medical: Not on file    Non-medical: Not on file  Tobacco Use  . Smoking status: Former Smoker    Years: 30.00  Types: Cigarettes    Last attempt to quit: 02/07/2017    Years since quitting: 1.2  . Smokeless tobacco: Never Used  Substance and Sexual Activity  . Alcohol use: No    Alcohol/week: 0.0 oz  . Drug use: No  . Sexual activity: Yes    Birth control/protection: Post-menopausal  Lifestyle  . Physical activity:    Days per week: Not on file    Minutes per session: Not on file  . Stress: Not on file  Relationships  . Social connections:    Talks on phone: Not on file    Gets together: Not on file    Attends religious service: Not on file    Active member of club or organization: Not on file    Attends meetings of clubs or organizations: Not on file    Relationship status: Not on file  Other Topics Concern  . Not on file  Social History Narrative  . Not on file     Review of Systems: A 12 point ROS discussed and pertinent positives are indicated in the HPI above.  All other systems are negative.  Review of Systems  Constitutional: Negative for fatigue and fever.  Respiratory: Negative for cough and shortness of breath.   Cardiovascular: Negative for chest pain.  Gastrointestinal: Negative for abdominal pain.  Musculoskeletal: Negative for back pain.  Neurological: Positive for headaches (at time of visit, has hx of migraines per pt).  Psychiatric/Behavioral: Negative for behavioral problems and confusion.    Vital Signs: BP 96/74   Pulse 84   Temp 98.9 F (37.2 C) (Oral)   Resp 10   Ht 5\' 7"  (1.702 m)   Wt 200 lb 9.9 oz (91 kg)   SpO2 100%   BMI 31.42 kg/m   Physical Exam  Constitutional: She is oriented to person, place, and time. She  appears well-developed.  Neck: Normal range of motion. Neck supple.  Cardiovascular: Normal rate, regular rhythm and normal heart sounds.  Pulmonary/Chest: Effort normal and breath sounds normal. No respiratory distress.  Abdominal: Soft. She exhibits no distension.  Musculoskeletal: Normal range of motion.  Neurological: She is alert and oriented to person, place, and time.  Skin: Skin is warm and dry.  Psychiatric: She has a normal mood and affect. Her behavior is normal. Judgment and thought content normal.  Nursing note and vitals reviewed.    MD Evaluation Airway: WNL Heart: WNL Abdomen: WNL Chest/ Lungs: WNL ASA  Classification: 3 Mallampati/Airway Score: One   Imaging: US Renal  Result Date: 04/25/2018 CLINICAL DATA:  Initial evaluation for acute renal injury. EXAM: RENAL / URINARY TRACT ULTRASOUND COMPLETE COMPARISON:  Prior CT from 11/30/2017 FINDINGS: Right Kidney: Length: 14.4 cm. Echogenicity within normal limits. No mass or hydronephrosis visualized. Left Kidney: Length: 13.7 cm. Echogenicity within normal limits. No mass or hydronephrosis visualized. Bladder: Bladder decompressed with a Foley catheter in place. IMPRESSION: 1. Negative renal ultrasound.  No hydronephrosis. 2. Bladder decompressed with a Foley catheter in place. Electronically Signed   By: Jeannine Boga M.D.   On: 04/25/2018 06:55   Dg Chest Port 1 View  Result Date: 05/02/2018 CLINICAL DATA:  Pulmonary edema. EXAM: PORTABLE CHEST 1 VIEW COMPARISON:  05/01/2018. FINDINGS: Endotracheal tube and nasogastric tube have been removed. Right IJ central line tip projects over the SVC. Left IJ central line tip also projects over the SVC. Heart size stable. Mild diffuse interstitial prominence and indistinctness with improvement in aeration in the lung bases. Small bilateral pleural  effusions. IMPRESSION: Improving congestive heart failure. Electronically Signed   By: Lorin Picket M.D.   On: 05/02/2018 07:18     Dg Chest Port 1 View  Result Date: 05/01/2018 CLINICAL DATA:  Respiratory failure. EXAM: PORTABLE CHEST 1 VIEW COMPARISON:  04/30/2018. FINDINGS: Endotracheal tube, NG tube, right IJ line stable position. Stable cardiomegaly persistent bibasilar infiltrates/edema and small bilateral pleural effusions. No interim change. No pneumothorax. IMPRESSION: 1.  Lines and tubes in stable position. 2. Persistent bibasilar infiltrates/edema and small bilateral pleural effusions. No significant interim change. 3.  Stable cardiomegaly. Electronically Signed   By: Marcello Moores  Register   On: 05/01/2018 06:25   Dg Chest Port 1 View  Result Date: 04/30/2018 CLINICAL DATA:  Intubation EXAM: PORTABLE CHEST 1 VIEW COMPARISON:  04/29/2018 FINDINGS: Endotracheal tube in good position. NG tube in the stomach. Central venous catheter tip in the lower SVC unchanged. No pneumothorax Cardiac enlargement. Diffuse bilateral airspace disease has progressed, probable edema. Progression of bibasilar atelectasis and small pleural effusions bilaterally. IMPRESSION: Support lines remain in good position Worsening bilateral airspace disease most consistent with pulmonary edema. Progression of bibasilar atelectasis and bilateral effusions. Electronically Signed   By: Franchot Gallo M.D.   On: 04/30/2018 07:16   Dg Chest Port 1 View  Result Date: 04/29/2018 CLINICAL DATA:  Hypoxia EXAM: PORTABLE CHEST 1 VIEW COMPARISON:  April 28, 2018 FINDINGS: Endotracheal tube tip is 2.1 cm above the carina. Right central catheter tip is in the superior vena cava. Left central catheter tip is at the junction of the left innominate vein and superior vena cava, stable. Nasogastric tube tip and side port below the diaphragm. No pneumothorax. There is bibasilar atelectasis with small pleural effusion on the right. No consolidation. Heart is mildly enlarged with pulmonary vascularity normal. No adenopathy. No bone lesions. IMPRESSION: Tube and catheter positions  as described without pneumothorax. Mild bibasilar atelectasis. Small right pleural effusion. No consolidation. Stable cardiac prominence. Electronically Signed   By: Lowella Grip III M.D.   On: 04/29/2018 07:34   Dg Chest Port 1 View  Result Date: 04/28/2018 CLINICAL DATA:  ETT placement and HD catheter placement EXAM: PORTABLE CHEST 1 VIEW COMPARISON:  04/27/2018. FINDINGS: Cardiomegaly. HD catheter tip distal SVC. ET tube tip 3.3 cm above carina. BILATERAL pulmonary opacities, RIGHT worse than LEFT, consistent with pulmonary edema. Central venous catheter tip unchanged from LEFT IJ approach, proximal SVC. No pneumothorax. IMPRESSION: Cardiomegaly with pulmonary edema may be minimally worse. ET tube and HD catheter appear in satisfactory position. No pneumothorax. Electronically Signed   By: Staci Righter M.D.   On: 04/28/2018 16:57   Dg Chest Port 1 View  Result Date: 04/27/2018 CLINICAL DATA:  Acute respiratory failure EXAM: PORTABLE CHEST 1 VIEW COMPARISON:  04/25/2018 FINDINGS: Bilateral mild interstitial thickening with patchy alveolar airspace opacities at bilateral lung bases. No pleural effusion or pneumothorax. Stable cardiomediastinal silhouette. Left IJ central venous catheter with the tip projecting over the SVC in unchanged position. No acute osseous abnormality. IMPRESSION: 1. Bilateral interstitial thickening concerning for mild interstitial edema versus interstitial infection. Electronically Signed   By: Kathreen Devoid   On: 04/27/2018 08:37   Dg Chest Port 1 View  Result Date: 04/25/2018 CLINICAL DATA:  Central line placement. EXAM: PORTABLE CHEST 1 VIEW COMPARISON:  Chest radiograph performed 04/24/2018 FINDINGS: The patient's left the IJ line is noted ending about the proximal SVC. Mild vascular congestion is noted. Mild bibasilar atelectasis is seen. No pleural effusion or pneumothorax is seen, though  the lung bases are incompletely characterized. The cardiomediastinal silhouette  is borderline normal in size. No acute osseous abnormalities are identified. IMPRESSION: 1. Left IJ line noted ending about the proximal SVC. No pneumothorax. 2. Mild vascular congestion noted; mild bibasilar atelectasis seen. Electronically Signed   By: Garald Balding M.D.   On: 04/25/2018 03:57   Dg Chest Portable 1 View  Result Date: 04/24/2018 CLINICAL DATA:  Initial evaluation for acute sepsis. EXAM: PORTABLE CHEST 1 VIEW COMPARISON:  Prior radiograph from 12/02/2017. FINDINGS: Moderate cardiomegaly, stable. Mediastinal silhouette within normal limits. No focal infiltrates. No pulmonary edema or visible pleural effusion. No pneumothorax. Osseous structures within normal limits. IMPRESSION: 1. No active cardiopulmonary disease. 2. Stable cardiomegaly without pulmonary edema. Electronically Signed   By: Jeannine Boga M.D.   On: 04/24/2018 22:19    Labs:  CBC: Recent Labs    05/02/18 0604 05/02/18 2314 05/03/18 0427 05/04/18 0445  WBC 15.0* 13.3* 17.3* 10.2  HGB 8.0* 7.4* 8.2* 7.7*  HCT 25.8* 24.7* 27.0* 25.4*  PLT 183 167 262 227    COAGS: Recent Labs    11/28/17 0137  11/28/17 1900 03/24/18 1440  04/25/18 1223 04/25/18 2215 04/27/18 1423 04/28/18 0500 05/03/18 1418  INR 1.30  --  1.40 1.15  --   --   --   --   --  1.28  APTT  --    < > 42* 32   < > 70* 47* 72* 96*  --    < > = values in this interval not displayed.    BMP: Recent Labs    05/02/18 0604 05/02/18 2314 05/03/18 0427 05/04/18 0445  NA 137 138 135 137  K 3.6 4.3 3.8 4.6  CL 99* 100* 100* 100*  CO2 29 27 23 25   GLUCOSE 82 123* 99 195*  BUN 30* 31* 31* 36*  CALCIUM 7.9* 7.9* 8.0* 8.6*  CREATININE 4.83* 4.86* 5.12* 5.65*  GFRNONAA 9* 9* 9* 8*  GFRAA 11* 11* 10* 9*    LIVER FUNCTION TESTS: Recent Labs    02/27/18 1819 03/24/18 1440 04/24/18 2345 05/03/18 0427  BILITOT 0.2* 0.4 0.6 0.4  AST 9* 10* 26 14*  ALT 6* 6* 10* 7*  ALKPHOS 102 117 131* 103  PROT 6.5 6.3* 6.1* 5.4*  ALBUMIN  2.6* 2.3* 1.8* 1.5*    TUMOR MARKERS: No results for input(s): AFPTM, CEA, CA199, CHROMGRNA in the last 8760 hours.  Assessment and Plan: Acute renal failure Patient with history of sepsis due to ischemic extremity, now with acute renal failure.  Request made for tunneled dialysis catheter.  Patient with headache at time of visit.  States RN is aware and she has pain medication.  Requests I call and speak to spouse, [name], re: catheter placement.  Patient will be NPO after midnight.  Not currently on blood thinners.   Risks and benefits discussed with the patient including, but not limited to bleeding, infection, vascular injury, pneumothorax which may require chest tube placement, air embolism or even death  All of the patient's questions were answered, patient is agreeable to proceed. Consent signed and in chart.   Thank you for this interesting consult.  I greatly enjoyed meeting [name] and look forward to participating in their care.  A copy of this report was sent to the requesting provider on this date.  Electronically Signed: Docia Barrier, PA 05/04/2018, 4:52 PM   I spent a total of 40 Minutes    in face to  face in clinical consultation, greater than 50% of which was counseling/coordinating care for acute renal failure.

## 2018-05-04 NOTE — Progress Notes (Signed)
PULMONARY / CRITICAL CARE MEDICINE   Name: [name] MRN: [id number] DOB: [birth year]    ADMISSION DATE:  04/24/2018 CONSULTATION DATE:  04/25/2018  REFERRING MD:  Dr. 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).  She also had an NSTEMI with new cardiomyopathy.   SUBJECTIVE:  Feels OK  Wants to proceed with cath  VITAL SIGNS: BP 97/73   Pulse 94   Temp 97.7 F (36.5 C) (Oral)   Resp (!) 21   Ht 5\' 7"  (1.702 m)   Wt 200 lb 9.9 oz (91 kg)   SpO2 99%   BMI 31.42 kg/m   HEMODYNAMICS:    VENTILATOR SETTINGS:    INTAKE / OUTPUT: I/O last 3 completed shifts: In: 967.2 [I.V.:967.2] Out: 475 [Urine:475]  PHYSICAL EXAMINATION:  General:  Chronically ill appearing, resting comfortably in bed HENT: NCAT OP clear PULM: CTA B, normal effort CV: RRR, no mgr GI: BS+, soft, nontender MSK: s/p AKA, left leg wound looks clean, no redness or drainage Neuro: awake, alert, no distress, MAEW   LABS:  BMET Recent Labs  Lab 05/02/18 2314 05/03/18 0427 05/04/18 0445  NA 138 135 137  K 4.3 3.8 4.6  CL 100* 100* 100*  CO2 27 23 25   BUN 31* 31* 36*  CREATININE 4.86* 5.12* 5.65*  GLUCOSE 123* 99 195*    Electrolytes Recent Labs  Lab 04/28/18 0500 04/29/18 0320  05/02/18 2314 05/03/18 0427 05/04/18 0445  CALCIUM 7.6* 7.0*   < > 7.9* 8.0* 8.6*  MG 1.5* 1.7  --   --   --   --    < > = values in this interval not displayed.    CBC Recent Labs  Lab 05/02/18 2314 05/03/18 0427 05/04/18 0445  WBC 13.3* 17.3* 10.2  HGB 7.4* 8.2* 7.7*  HCT 24.7* 27.0* 25.4*  PLT 167 262 227    Coag's Recent Labs  Lab 04/27/18 1423 04/28/18 0500 05/03/18 1418  APTT 72* 96*  --   INR  --   --  1.28    Sepsis Markers Recent Labs  Lab 05/02/18 0604 05/02/18 2314  05/04/18 0445  LATICACIDVEN  --  0.8  --   PROCALCITON 0.51 0.35 0.25    ABG Recent Labs  Lab 04/28/18 1756 04/29/18 0324  PHART 7.452* 7.454*  PCO2ART 39.1 37.8  PO2ART 178.0* 149.0*    Liver Enzymes Recent Labs  Lab 05/03/18 0427  AST 14*  ALT 7*  ALKPHOS 103  BILITOT 0.4  ALBUMIN 1.5*    Cardiac Enzymes Recent Labs  Lab 05/02/18 2326 05/03/18 0427 05/03/18 1108  TROPONINI 0.36* 0.35* 0.30*    Glucose Recent Labs  Lab 05/03/18 0818 05/03/18 1054 05/03/18 1232 05/03/18 1847 05/03/18 2154 05/04/18 0800  GLUCAP 107* 123* 129* 187* 211* 186*    Imaging No results found.   STUDIES:  ECHO 6/11 >> EF 30 to 35%  CULTURES: Blood 6/7 >>negative UC 6/7 >>negative   ANTIBIOTICS: Vancomycin 6/7 >>6/11 Zosyn 6/7 >>6/11 Ceftazidime 6/11>>6/14  SIGNIFICANT EVENTS: 6/07 Presents to ED  6/09 Emergent Left AKA per Vascular surgery 6/11 Nephrology consulted for worsening oliguric renal failure, and metabolic acidosis  0/62 1st run of HD 6/13 Telemetry with new ST depression and T wave inversion 6/15 Transfer to tele 6/16 Dropped BP  after getting lopressor >> back to ICU and started on pressors  LINES/TUBES: L IJ CVL 6/08 >> R IJ Temporary HD cath 6/11 >> ETT 6/11 >> 6/14   DISCUSSION: 52 y/o female with septic shock from left foot gangrene requiring an AKA.  Her hospitalization has been complicated by acute respiratory failure and AKI requiring HD and an NSTEMI causing systolic heart failure. As of 6/17 she has weaned off of vasopressors  ASSESSMENT / PLAN:  PULMONARY A: Acute respiratory failure with hypoxemia> improved P:   Monitor O2 saturation  CARDIOVASCULAR A:  NSTEMI HFrEF Afib RVR P:  Tele ASA Plan left heart cath today Anti-arrhythmics per cardiology  RENAL A:   AKI P:   Continue intermittent HD per renal Will likely need HD access this week  GASTROINTESTINAL A:   Dysphagia P:   D3 diet on hold for left heart  cath  HEMATOLOGIC A:   Anemia of chronic kidney disase P:  Transfuse if Hgb < 7gm/dL  INFECTIOUS A:   Limb gangrene, s/p amputation and has completed antibiotics P:   Monitor off antibiotics  ENDOCRINE A:   Hyperglycemia  P:   Continue SSI  NEUROLOGIC A:   No acute issues P:   Monitor off of sedation protocol PT post LHC   FAMILY  - Updates: cousin updated bedside by me on 6/17   To TRH   Roselie Awkward, MD [facility] Pager: [phone] Cell: [phone] After 3pm or if no response, call [phone]   05/04/2018, 10:46 AM

## 2018-05-04 NOTE — Progress Notes (Addendum)
Notified by RN that patient had pulled HD catheter out.  Nephrology (Dr. Hollie Salk) updated. Plan is for HD on 6/18.  Some UOP in last 24-48 hours.  Remain hopeful for renal recovery but anticipate she will need HD in the interim.  Plan for perm cath for HD.  Consult IR.  Jamie Gens, NP-C [facility] Pgr: [phone] or if no answer [phone] 05/04/2018, 1:55 PM

## 2018-05-04 NOTE — Progress Notes (Signed)
Nutrition Follow-up   DOCUMENTATION CODES:   Not applicable  INTERVENTION:   Snacks  MVI daily  Rena-vite daily  Recommend liberal diet   NUTRITION DIAGNOSIS:   Increased nutrient needs related to wound healing as evidenced by increased estimated needs.  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).  She also had an NSTEMI with new cardiomyopathy.   Met with pt in room today. Pt extubated 6/14. Pt reports she is hungry today and ready to eat. Pt currently NPO for possible LHC today. Pt reports eating ~50% of her dinner last night. Pt with poor dentition on dysphagia 3 diet as recommended by SLP. Pt reports that she is unable to drink supplements as they cause her to have diarrhea. Pt also reports lactose intolerance. RD will order snacks to help pt meet her estimated needs. Recommend liberal diet as a renal/carb modified diet is really restrictive. Pt with RIJ cath in place requiring intermittent HD since 6/12. Per chart, pt with ~25lb weight gain since admit; pt +20.1L on I & Os. Pt reports her UBW is around 175lbs. RD will continue to monitor oral intake. Pt reports taking a daily MVI at home; recommend continue this here as pt is not eating enough to meet her estimated needs.   Medications reviewed and include: aspirin, colace, ferrous sulfate, solu-cortef, insulin, protonix, heparin, neo-synephrine  Labs reviewed: K 4.6 wnl, Cl 100(L), BUN 36(H), creat 5.65(H), Ca 8.6(L) P 7.9(H)- 6/9 Hgb 7.7(L), Hct 25.4(L) cbgs- 123, 129, 187, 211, 186 x 24hrs   Diet Order:   Diet Order           Diet renal/carb modified with fluid restriction Diet-HS Snack?  Nothing; Fluid restriction: 1800 mL Fluid; Room service appropriate? Yes; Fluid consistency: Thin  Diet effective now         EDUCATION NEEDS:   Not appropriate for education at this time  Skin:  Skin Assessment: Skin Integrity Issues: Skin Integrity Issues:: Stage III, Incisions Stage III: buttocks Incisions: left leg s/p BKA  Last BM:  6/17- type 6  Height:   Ht Readings from Last 1 Encounters:  05/02/18 '5\' 7"'  (1.702 m)    Weight:   Wt Readings from Last 1 Encounters:  05/04/18 200 lb 9.9 oz (91 kg)   Ideal Body Weight:  57.1 kg(adjusted L BKA)  Estimated Nutritional Needs:   Kcal:  1700-2000kcal/day   Protein:  80-95g/day   Fluid:  >1.5L/day   Koleen Distance MS, RD, LDN Pager #- [phone] Office#- [phone] After Hours Pager: [phone]

## 2018-05-04 NOTE — Progress Notes (Signed)
[facility] for Heparin  Indication: atrial fibrillation  Allergies  Allergen Reactions  . Lactose Intolerance (Gi)     Patient Measurements: Height: 5\' 7"  (170.2 cm) Weight: 200 lb 9.9 oz (91 kg) IBW/kg (Calculated) : 61.6  Vital Signs: Temp: 97.9 F (36.6 C) (06/17 1158) Temp Source: Oral (06/17 1158) BP: 96/76 (06/17 1300) Pulse Rate: 91 (06/17 1300)  Labs: Recent Labs    05/02/18 0604 05/02/18 2314 05/02/18 2326 05/03/18 0427 05/03/18 1108 05/03/18 1418 05/04/18 0445  HGB 8.0* 7.4*  --  8.2*  --   --  7.7*  HCT 25.8* 24.7*  --  27.0*  --   --  25.4*  PLT 183 167  --  262  --   --  227  LABPROT  --   --   --   --   --  15.9*  --   INR  --   --   --   --   --  1.28  --   HEPARINUNFRC 0.39  --   --  0.31  --   --  0.56  CREATININE 4.83* 4.86*  --  5.12*  --   --  5.65*  TROPONINI  --   --  0.36* 0.35* 0.30*  --   --     Assessment: 52 y/o F on Xarelto PTA for afib, was transitioned to heparin while inpatient and peri-operatively. 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 Heparin was resumed s/p procedure and now s/p temp HD cath  Unfortunately, the patient suffered a NSTEMI peri-operatively; therefore, heparin is being continued for dual indications. Appears that the plan is to treat the NSTEMI with medical management for the time being.   Heparin level remains therapeutic at 0.56 on 1600 units/hr. No bleeding noted. Hgb has trended down to 7.7, Plt wnl  Goal of Therapy:  Heparin level 0.3-0.7 units/ml Monitor platelets by anticoagulation protocol: Yes   Plan:  Continue heparin gtt at 1600 units/hr to keep in goal range Daily heparin level and CBC LHC postponed   Albertina Parr, PharmD., BCPS Clinical Pharmacist Clinical phone for 05/04/18 until 3:30pm: [id number] If after 3:30pm, please call main pharmacy at: [phone]

## 2018-05-04 NOTE — Progress Notes (Signed)
Admit: 04/24/2018 LOS: 79  89F with anuric AKI related to ATN from septic shock, ischemic/gangrenous LLL s/p L AKA  Subjective:   Afib with RVR improved overnight  Off pressor this AM  For possible LHC today per cardiology   06/16 0701 - 06/17 0700 In: 583.7 [I.V.:583.7] Out: 350 [Urine:350]  Filed Weights   05/02/18 1529 05/03/18 0500 05/04/18 0500  Weight: 88 kg (194 lb 0.1 oz) 89.1 kg (196 lb 6.9 oz) 91 kg (200 lb 9.9 oz)    Scheduled Meds: . aspirin  81 mg Oral Daily  . chlorhexidine  15 mL Mouth Rinse BID  . Chlorhexidine Gluconate Cloth  6 each Topical Q0600  . docusate sodium  100 mg Oral BID  . ferrous sulfate  325 mg Oral Daily  . hydrocortisone sod succinate (SOLU-CORTEF) inj  50 mg Intravenous Q6H  . insulin aspart  0-5 Units Subcutaneous QHS  . insulin aspart  0-9 Units Subcutaneous TID WC  . mouth rinse  15 mL Mouth Rinse q12n4p  . pantoprazole  40 mg Oral Q1200  . rosuvastatin  20 mg Oral q1800  . sodium chloride flush  10-40 mL Intracatheter Q12H  . sodium chloride flush  3 mL Intravenous Q12H   Continuous Infusions: . sodium chloride 10 mL/hr at 05/04/18 0800  . sodium chloride    . sodium chloride    . sodium chloride    . sodium chloride 1 mL/kg/hr (05/04/18 0816)  . heparin 1,600 Units/hr (05/04/18 0800)  . phenylephrine (NEO-SYNEPHRINE) Adult infusion Stopped (05/04/18 0802)   PRN Meds:.sodium chloride, sodium chloride, sodium chloride, sodium chloride, acetaminophen, alteplase, fentaNYL (SUBLIMAZE) injection, heparin, nitroGLYCERIN, ondansetron (ZOFRAN) IV, oxyCODONE, sodium chloride flush, sodium chloride flush  Current Labs: reviewed    Physical Exam:  Blood pressure 98/77, pulse 94, temperature 97.7 F (36.5 C), temperature source Oral, resp. rate 16, height 5\' 7"  (1.702 m), weight 91 kg (200 lb 9.9 oz), SpO2 100 %. GEN: Chronically ill-appearing, NAD, lying in bed, awake and conversant ENT: NCAT, poor dentition with mult caries CV: RRR, no  rub, normal S1 and S2 PULM: CTAB anteriorly ABD: Soft, nontender EXT: Right lower extremity without edema, LLE s/p AKA with staple line c/d/i ACESS: R IJ Temp HD cath present  A/P 1. Anuric AKI from ATN, normal baseline Scr.  Started HD 04/29/18, still no evidence for recovery at this time.  Has R IJ Temp HD cath [facility] 04/28/18.  No immediate need for HD today, will hold off in light of LHC and plan for next HD tomorrow 6/18.  2. 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 Septic shock, on hydrocortisone.  Off pressor this AM 4. DM2 5. NSTEMI, cardiology following new depressed LVEF and RWMA on 6/11 TTE; needs LHC--> tentatively scheduled for today 6/17. 6. Hx/o AFib; RVR 7. VDRF, extubated  Madelon Lips MD 05/04/2018, 9:06 AM  Recent Labs  Lab 05/02/18 2314 05/03/18 0427 05/04/18 0445  NA 138 135 137  K 4.3 3.8 4.6  CL 100* 100* 100*  CO2 27 23 25   GLUCOSE 123* 99 195*  BUN 31* 31* 36*  CREATININE 4.86* 5.12* 5.65*  CALCIUM 7.9* 8.0* 8.6*   Recent Labs  Lab 05/02/18 2314 05/03/18 0427 05/04/18 0445  WBC 13.3* 17.3* 10.2  NEUTROABS  --  13.1*  --   HGB 7.4* 8.2* 7.7*  HCT 24.7* 27.0* 25.4*  MCV 82.3 80.6 80.9  PLT 167 262 227

## 2018-05-04 NOTE — Progress Notes (Addendum)
Progress Note  Cardiologist: Nelva Bush, MD   Subjective   Anxious to either have cath or eat. No CP/SOB. Some pain at incision site  Inpatient Medications    Scheduled Meds: . aspirin  81 mg Oral Daily  . chlorhexidine  15 mL Mouth Rinse BID  . Chlorhexidine Gluconate Cloth  6 each Topical Q0600  . docusate sodium  100 mg Oral BID  . ferrous sulfate  325 mg Oral Daily  . hydrocortisone sod succinate (SOLU-CORTEF) inj  50 mg Intravenous Q6H  . insulin aspart  0-5 Units Subcutaneous QHS  . insulin aspart  0-9 Units Subcutaneous TID WC  . mouth rinse  15 mL Mouth Rinse q12n4p  . pantoprazole  40 mg Oral Q1200  . rosuvastatin  20 mg Oral q1800  . sodium chloride flush  10-40 mL Intracatheter Q12H  . sodium chloride flush  3 mL Intravenous Q12H   Continuous Infusions: . sodium chloride 10 mL/hr at 05/04/18 0800  . sodium chloride    . sodium chloride    . sodium chloride    . sodium chloride 1 mL/kg/hr (05/04/18 0816)  . heparin 1,600 Units/hr (05/04/18 0800)  . phenylephrine (NEO-SYNEPHRINE) Adult infusion Stopped (05/04/18 0802)   PRN Meds: sodium chloride, sodium chloride, sodium chloride, sodium chloride, acetaminophen, alteplase, fentaNYL (SUBLIMAZE) injection, heparin, nitroGLYCERIN, ondansetron (ZOFRAN) IV, oxyCODONE, sodium chloride flush, sodium chloride flush   Vital Signs    Vitals:   05/04/18 0630 05/04/18 0700 05/04/18 0800 05/04/18 0804  BP: 104/86 108/89 98/77 98/77   Pulse: 94 96 93 94  Resp: 13 15 12 16   Temp:    97.7 F (36.5 C)  TempSrc:    Oral  SpO2: 98% 97% 100% 100%  Weight:      Height:        Intake/Output Summary (Last 24 hours) at 05/04/2018 1008 Last data filed at 05/04/2018 0800 Gross per 24 hour  Intake 543.7 ml  Output 300 ml  Net 243.7 ml   Filed Weights   05/02/18 1529 05/03/18 0500 05/04/18 0500  Weight: 194 lb 0.1 oz (88 kg) 196 lb 6.9 oz (89.1 kg) 200 lb 9.9 oz (91 kg)    Telemetry    Sinus rhythm, rare PVCs -  Personally Reviewed  Physical Exam   GEN- The patient is ill appearing, alert and oriented x 3 today.   Head- normocephalic, atraumatic Eyes-  Sclera clear, conjunctiva pink, R eye has some redness Ears- hearing intact Oropharynx- clear Neck- supple, Lungs- Clear to ausculation bilaterally, normal work of breathing Heart- Regular rate and rhythm  GI- soft, NT, ND, + BS Extremities- no clubbing, cyanosis, or edema [name], s/p L BKA, incision healing well. MS- no significant deformity or atrophy Skin- no rash or lesion Psych- euthymic mood, full affect Neuro- strength and sensation are intact   Labs    Chemistry Recent Labs  Lab 05/02/18 2314 05/03/18 0427 05/04/18 0445  NA 138 135 137  K 4.3 3.8 4.6  CL 100* 100* 100*  CO2 27 23 25   GLUCOSE 123* 99 195*  BUN 31* 31* 36*  CREATININE 4.86* 5.12* 5.65*  CALCIUM 7.9* 8.0* 8.6*  PROT  --  5.4*  --   ALBUMIN  --  1.5*  --   AST  --  14*  --   ALT  --  7*  --   ALKPHOS  --  103  --   BILITOT  --  0.4  --   GFRNONAA 9* 9* 8*  GFRAA 11* 10* 9*  ANIONGAP 11 12 12      Hematology Recent Labs  Lab 05/02/18 2314 05/03/18 0427 05/04/18 0445  WBC 13.3* 17.3* 10.2  RBC 3.00* 3.35* 3.14*  HGB 7.4* 8.2* 7.7*  HCT 24.7* 27.0* 25.4*  MCV 82.3 80.6 80.9  MCH 24.7* 24.5* 24.5*  MCHC 30.0 30.4 30.3  RDW 18.0* 18.2* 18.2*  PLT 167 262 227    Cardiac Enzymes Recent Labs  Lab 04/30/18 2045 05/02/18 2326 05/03/18 0427 05/03/18 1108  TROPONINI 0.68* 0.36* 0.35* 0.30*   No results for input(s): TROPIPOC in the last 168 hours.      Cardiology Studies     ECHO: 04/28/2018 - Left ventricle: The cavity size was normal. There was mild   concentric hypertrophy. Systolic function was moderately to   severely reduced. The estimated ejection fraction was in the   range of 30% to 35%. Features are consistent with a pseudonormal   left ventricular filling pattern, with concomitant abnormal   relaxation and increased filling  pressure (grade 2 diastolic   dysfunction). Doppler parameters are consistent with elevated   ventricular end-diastolic filling pressure. - Mitral valve: Calcified annulus. Moderately thickened, moderately   calcified leaflets . There was mild regurgitation. - Left atrium: The atrium was mildly dilated. - Right ventricle: The cavity size was moderately dilated. Wall   thickness was normal. Systolic function was moderately reduced. - Tricuspid valve: There was mild regurgitation. - Pulmonary arteries: Systolic pressure was mildly increased. PA   peak pressure: 34 mm Hg (S). - Pericardium, extracardiac: A trivial pericardial effusion was   identified. Features were not consistent with tamponade   physiology. Impressions: - When compared to the prior study from 04/17/2017 LVEF has   decreased from 55-60% to 30-35% with new wall motion   abnormalities - diffuse hypokinesis and akinesis of the basal and   mid anteroseptal, anterior and apical septal and anterior walls.   RVEF is moderately decreased.    Patient Profile     52 y.o. female with PAD, PAF, DM, and chronic limb ischemia here with complications of L fem-post tib bypass.  She was admitted for redo L fem-PTA bypass 03/24/2018 then developed occlusion requiring thrombectomy.  On 6/3 she had re-occlusion, was hypotensive and febrile but declined admission b/c she wanted to go on a camping trip.    Presented to the ED 6/7 with sepsis requiring pressors, LLE gangrene.  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  Cardiology consulted 06/10 for NSTEMI and acute systolic and diastolic heart failure, EF now 30-35% w/ +WMA.     Assessment & Plan    1.  Acute systolic and diastolic dysfunction - pt feels ready for cath today, early afternoon case - She is still quite ill, but is now off pressors.   - believe she is hemodynamically stable for cath - The risks and benefits of a cardiac catheterization including, but not limited to, death, stroke, MI, kidney  damage and bleeding were discussed with the patient and her husband, who indicate understanding and agree to proceed.  - will review with MD, since her H&H is still low.  2. Paroxysmal arial fibrillation Maintaining sinus rhythm Anticoagulation on hold pending cath Given renal failure, may require coumadin rather than DOAC  3. AKI - Due to ATN, sepsis, gangrene - Receiving HD, per Nephrology - renal following, no recovery of renal function as yet - Renal aware of plans for cath, they will dialyze in am - with need for HD, will  decrease pre-cath fluids to 40 cc/hr  4. Sepsis Improving, per CCM - off pressors, SBP 90s w/ MAP 80s  Rhonda Barrett, PA-C 05/04/2018 10:26 AM Beeper [phone]  I have seen and examined the patient along with Rosaria Ferries, PA-C  I have reviewed the chart, notes and new data.  I agree with PA/NP's note.  Key new complaints: looks comfortable, sitting at 30 deg HOB elevation, denies angina and dyspnea. No longer on pressors Key examination changes: no overt hypervolemia, no arrhythmia on telemetry Key new findings / data: remains oligoanuric (350 mL/24h) with markedly abnormal renal parameters, but electrolytes OK.  PLAN: Recovering from septic shock due to limb gangrene, complicated by NSTEMI, still with oligoanuric kidney failure, but off pressors. Strongly suspect that she had MI due to severe hemodynamic imbalance in the setting of underlying CAD (likely multivessel). The area of myocardium in jeopardy (by echo - the entire LAD territory) is much larger than the small amount of actual myocardial damage (peak troponin 7.7). While she clearly will need coronary angiography, this is not emergent, as she is no longer in shock. While there is still a chance her kidneys will recover (creatinine was 0.80-1.07 in May 2019), I would not perform any contrast based studies, including angiography. If she either is deemed to have ESRD (premature to say that) or she has  marked improvement in renal parameters, she can undergo coronary angiography (preferably prior to hospital DC). Otherwise, I would delay coronary studies unless there is a coronary emergency. Discussed with PCCM Attending Dr. Lake Bells after reviewing her renal prognosis with Dr. Joelyn Oms, who saw her over the weekend for Nephrology.   Sanda Klein, MD, [place] [phone] 05/04/2018, 11:51 AM

## 2018-05-05 ENCOUNTER — Encounter (HOSPITAL_COMMUNITY): Payer: Self-pay | Admitting: Interventional Radiology

## 2018-05-05 ENCOUNTER — Inpatient Hospital Stay (HOSPITAL_COMMUNITY): Payer: Medicare Other

## 2018-05-05 HISTORY — PX: IR US GUIDE VASC ACCESS RIGHT: IMG2390

## 2018-05-05 HISTORY — PX: IR FLUORO GUIDE CV LINE RIGHT: IMG2283

## 2018-05-05 LAB — BASIC METABOLIC PANEL
ANION GAP: 10 (ref 5–15)
BUN: 44 mg/dL — AB (ref 6–20)
CO2: 26 mmol/L (ref 22–32)
Calcium: 8.7 mg/dL — ABNORMAL LOW (ref 8.9–10.3)
Chloride: 100 mmol/L — ABNORMAL LOW (ref 101–111)
Creatinine, Ser: 6.09 mg/dL — ABNORMAL HIGH (ref 0.44–1.00)
GFR calc Af Amer: 8 mL/min — ABNORMAL LOW (ref >60)
GFR, EST NON AFRICAN AMERICAN: 7 mL/min — AB (ref >60)
GLUCOSE: 189 mg/dL — AB (ref 65–99)
POTASSIUM: 4.5 mmol/L (ref 3.5–5.1)
Sodium: 136 mmol/L (ref 135–145)

## 2018-05-05 LAB — GLUCOSE, CAPILLARY
GLUCOSE-CAPILLARY: 183 mg/dL — AB (ref 65–99)
GLUCOSE-CAPILLARY: 172 mg/dL — AB (ref 65–99)
GLUCOSE-CAPILLARY: 143 mg/dL — AB (ref 65–99)
GLUCOSE-CAPILLARY: 150 mg/dL — AB (ref 65–99)

## 2018-05-05 LAB — HEPARIN LEVEL (UNFRACTIONATED): HEPARIN UNFRACTIONATED: 0.67 [IU]/mL (ref 0.30–0.70)

## 2018-05-05 MED ORDER — PHENOL 1.4 % MT LIQD
1.0000 | OROMUCOSAL | Status: DC | PRN
  Administered 2018-05-06: 1 via OROMUCOSAL
  Filled 2018-05-05: qty 177

## 2018-05-05 MED ORDER — LIDOCAINE HCL 1 % IJ SOLN
INTRAMUSCULAR | Status: AC
  Filled 2018-05-05: qty 20

## 2018-05-05 MED ORDER — FENTANYL CITRATE (PF) 100 MCG/2ML IJ SOLN
INTRAMUSCULAR | Status: AC
  Filled 2018-05-05: qty 2

## 2018-05-05 MED ORDER — FENTANYL CITRATE (PF) 100 MCG/2ML IJ SOLN
25.0000 ug | INTRAMUSCULAR | Status: DC | PRN
  Administered 2018-05-08: 25 ug via INTRAVENOUS
  Filled 2018-05-05: qty 2

## 2018-05-05 MED ORDER — FENTANYL CITRATE (PF) 100 MCG/2ML IJ SOLN
INTRAMUSCULAR | Status: AC | PRN
  Administered 2018-05-05: 12.5 ug via INTRAVENOUS

## 2018-05-05 MED ORDER — CEFAZOLIN SODIUM-DEXTROSE 2-4 GM/100ML-% IV SOLN
INTRAVENOUS | Status: AC
  Filled 2018-05-05: qty 100

## 2018-05-05 MED ORDER — GELATIN ABSORBABLE 12-7 MM EX MISC
CUTANEOUS | Status: AC
  Filled 2018-05-05: qty 1

## 2018-05-05 MED ORDER — SODIUM CHLORIDE 0.9 % IV SOLN
INTRAVENOUS | Status: AC | PRN
  Administered 2018-05-05: 10 mL/h via INTRAVENOUS

## 2018-05-05 MED ORDER — SODIUM CHLORIDE 0.9 % IV SOLN
INTRAVENOUS | Status: DC
  Administered 2018-05-05 – 2018-05-08 (×3): via INTRAVENOUS

## 2018-05-05 MED ORDER — HEPARIN (PORCINE) IN NACL 100-0.45 UNIT/ML-% IJ SOLN
1550.0000 [IU]/h | INTRAMUSCULAR | Status: DC
Start: 2018-05-05 — End: 2018-05-07
  Administered 2018-05-05 – 2018-05-06 (×3): 1550 [IU]/h via INTRAVENOUS
  Filled 2018-05-05 (×2): qty 250

## 2018-05-05 MED ORDER — MIDAZOLAM HCL 2 MG/2ML IJ SOLN
INTRAMUSCULAR | Status: AC | PRN
  Administered 2018-05-05: 0.5 mg via INTRAVENOUS

## 2018-05-05 MED ORDER — MIDAZOLAM HCL 2 MG/2ML IJ SOLN
INTRAMUSCULAR | Status: AC
  Filled 2018-05-05: qty 2

## 2018-05-05 MED ORDER — CEFAZOLIN SODIUM-DEXTROSE 2-4 GM/100ML-% IV SOLN
2.0000 g | INTRAVENOUS | Status: AC
  Filled 2018-05-05: qty 100

## 2018-05-05 MED ORDER — LIDOCAINE HCL (PF) 1 % IJ SOLN
INTRAMUSCULAR | Status: AC | PRN
  Administered 2018-05-05: 5 mL

## 2018-05-05 MED ORDER — HEPARIN SODIUM (PORCINE) 1000 UNIT/ML IJ SOLN
INTRAMUSCULAR | Status: AC
  Filled 2018-05-05: qty 1

## 2018-05-05 NOTE — Progress Notes (Signed)
Progress Note  Patient Name: [name] Date of Encounter: 05/05/2018  Primary Cardiologist: Nelva Bush, MD   Subjective   No CV complaints. A little confused, but not in any distress. Remains oligoanuric.  Inpatient Medications    Scheduled Meds: . aspirin  81 mg Oral Daily  . chlorhexidine  15 mL Mouth Rinse BID  . Chlorhexidine Gluconate Cloth  6 each Topical Q0600  . docusate sodium  100 mg Oral BID  . ferrous sulfate  325 mg Oral Daily  . insulin aspart  0-5 Units Subcutaneous QHS  . insulin aspart  0-9 Units Subcutaneous TID WC  . mouth rinse  15 mL Mouth Rinse q12n4p  . multivitamin  1 tablet Oral QHS  . pantoprazole  40 mg Oral Q1200  . rosuvastatin  20 mg Oral q1800  . sodium chloride flush  10-40 mL Intracatheter Q12H   Continuous Infusions: . sodium chloride    . sodium chloride    . sodium chloride    .  ceFAZolin (ANCEF) IV    . heparin Stopped (05/05/18 1057)   PRN Meds: sodium chloride, sodium chloride, acetaminophen, alteplase, fentaNYL (SUBLIMAZE) injection, heparin, nitroGLYCERIN, ondansetron (ZOFRAN) IV, oxyCODONE, phenol, sodium chloride flush   Vital Signs    Vitals:   05/05/18 0900 05/05/18 1000 05/05/18 1100 05/05/18 1144  BP: (!) 110/91 110/89    Pulse: 88 87 76   Resp: 14 12 (!) 7   Temp:    97.6 F (36.4 C)  TempSrc:    Oral  SpO2: 100% 100% 100%   Weight:      Height:        Intake/Output Summary (Last 24 hours) at 05/05/2018 1157 Last data filed at 05/05/2018 1000 Gross per 24 hour  Intake 1478.19 ml  Output 238 ml  Net 1240.19 ml   Filed Weights   05/03/18 0500 05/04/18 0500 05/05/18 0500  Weight: 196 lb 6.9 oz (89.1 kg) 200 lb 9.9 oz (91 kg) 205 lb 0.4 oz (93 kg)    Telemetry    NSR/mild STachy, occ PVCs - Personally Reviewed  ECG    No new ECG - Personally Reviewed  Physical Exam  Chronically ill appearing, pale GEN: No acute distress.   Neck: No JVD Cardiac: RRR, no murmurs, rubs, or gallops.    Respiratory: Clear to auscultation bilaterally. GI: Soft, nontender, non-distended  MS: No edema; s/p L BKA Neuro:  Nonfocal  Psych: Normal affect   Labs    Chemistry Recent Labs  Lab 05/03/18 0427 05/04/18 0445 05/05/18 0524  NA 135 137 136  K 3.8 4.6 4.5  CL 100* 100* 100*  CO2 23 25 26   GLUCOSE 99 195* 189*  BUN 31* 36* 44*  CREATININE 5.12* 5.65* 6.09*  CALCIUM 8.0* 8.6* 8.7*  PROT 5.4*  --   --   ALBUMIN 1.5*  --   --   AST 14*  --   --   ALT 7*  --   --   ALKPHOS 103  --   --   BILITOT 0.4  --   --   GFRNONAA 9* 8* 7*  GFRAA 10* 9* 8*  ANIONGAP 12 12 10      Hematology Recent Labs  Lab 05/02/18 2314 05/03/18 0427 05/04/18 0445  WBC 13.3* 17.3* 10.2  RBC 3.00* 3.35* 3.14*  HGB 7.4* 8.2* 7.7*  HCT 24.7* 27.0* 25.4*  MCV 82.3 80.6 80.9  MCH 24.7* 24.5* 24.5*  MCHC 30.0 30.4 30.3  RDW 18.0*  18.2* 18.2*  PLT 167 262 227    Cardiac Enzymes Recent Labs  Lab 04/30/18 2045 05/02/18 2326 05/03/18 0427 05/03/18 1108  TROPONINI 0.68* 0.36* 0.35* 0.30*   No results for input(s): TROPIPOC in the last 168 hours.   BNPNo results for input(s): BNP, PROBNP in the last 168 hours.   DDimer No results for input(s): DDIMER in the last 168 hours.   Radiology    No results found.  Cardiac Studies   ECHO: 04/28/2018 - Left ventricle: The cavity size was normal. There was mild concentric hypertrophy. Systolic function was moderately to severely reduced. The estimated ejection fraction was in the range of 30% to 35%. Features are consistent with a pseudonormal left ventricular filling pattern, with concomitant abnormal relaxation and increased filling pressure (grade 2 diastolic dysfunction). Doppler parameters are consistent with elevated ventricular end-diastolic filling pressure. - Mitral valve: Calcified annulus. Moderately thickened, moderately calcified leaflets . There was mild regurgitation. - Left atrium: The atrium was mildly  dilated. - Right ventricle: The cavity size was moderately dilated. Wall thickness was normal. Systolic function was moderately reduced. - Tricuspid valve: There was mild regurgitation. - Pulmonary arteries: Systolic pressure was mildly increased. PA peak pressure: 34 mm Hg (S). - Pericardium, extracardiac: A trivial pericardial effusion was identified. Features were not consistent with tamponade physiology. Impressions: - When compared to the prior study from 04/17/2017 LVEF has decreased from 55-60% to 30-35% with new wall motion abnormalities - diffuse hypokinesis and akinesis of the basal and mid anteroseptal, anterior and apical septal and anterior walls. RVEF is moderately decreased.  Patient Profile     52 y.o. female with PAD, PAF, DM, and chronic limb ischemia here with complications of L fem-post tib bypass. She was admitted for redo L fem-PTA bypass 03/24/2018 then developed occlusion requiring thrombectomy. On 6/3 she had re-occlusion, was hypotensive and febrile but declined admission b/c she wanted to go on a camping trip.   Presented to the ED 6/7 with sepsis requiring pressors, LLE gangrene. 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 Cardiology consulted 06/10 for NSTEMI and acute systolic and diastolic heart failure, EF now 30-35% w/ +WMA.   Assessment & Plan    1.  CHF: acute drop in systolic LV function. Cannot use RAAS inh. Overall appears euvolemic.  2. NSTEMI:  Likely demand injury due to septic shock with high level of suspicion for underlying CAD. Plan cardiac cath for evaluation of coronaries once renal function improves or she is clearly ESRD. 3. AFib: currently in NSR. Can resume anticoagulation - choice of agent depends on renal function.  4. ARF: secondary to septic shock, no improvement yet, but her baseline renal function was normal. 5. Septic shock: resolved, off pressors 6. PAD/limb gangrene s/p L BKA.    For questions or updates, please contact  [place] Please consult [email or web address] for contact info under Cardiology/STEMI.      Signed, Sanda Klein, MD  05/05/2018, 11:57 AM

## 2018-05-05 NOTE — Progress Notes (Addendum)
[facility] TEAM 1 - Stepdown/ICU TEAM  KINDA POTTLE  INO:[id number] DOB: [birth year] DOA: 04/24/2018 PCP: [name], MD    Brief Narrative:  52 y/o female with CAD and peripheral arterial disease who was admitted with sepsis from an ischemic lower extremity eventually requiring amputation (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).  She also had a NSTEMI with new cardiomyopathy.  Significant Events: 6/07 admit  6/09 Emergent Left AKA per Vascular Surgery 6/11 Nephrology consulted for worsening oliguric renal failure, and metabolic acidosis - intubated 6/12 1st run of HD 6/13 Telemetry with new ST depression and T wave inversion 6/14 extubated  6/15 Transfer to tele 6/16 Dropped BP after getting lopressor > back to ICU and started on pressors 6/18 TRH assumed care   Subjective: Pt is alert but confused.  She c/o a generalized HA.  She denies cp, n/v, or abdom pain.    Assessment & Plan:  L Limb gangrene - s/p AKA Care as per VVS - staples to be removed 4 weeks after surgery date   Acute Kidney Failure  Due to ATN in setting of sepsis - pulled out her temp HD cath 6/17 - to have tunneled cath placed today - ongoing HD per Nephrology - crt 0.8-1.0 May 2019)  Acute hypoxic respiratory failure  Essentially resolved at this time w/ sats 100% on 2L [place] - wean to RA   NSTEMI Cardiology following - for eventual cardiac cath when renal status more clear/stable  Acute systolic and diastolic CHF  TTE 0/96 noted EF 30-35% w/ grade 2 DD - volume control per HD at this time   Filed Weights   05/03/18 0500 05/04/18 0500 05/05/18 0500  Weight: 89.1 kg (196 lb 6.9 oz) 91 kg (200 lb 9.9 oz) 93 kg (205 lb 0.4 oz)    Acute delirium  Evaluate for reversible causes - no worrisome focal neuro deficits - stop high dose steroids   Parox Afib w/ RVR Now in NSR - anticoag w/ heparin while procedures being planned/considered   Dysphagia  D3 diet     Anemia No evidence of acute blood loss -  follow  DM Reasonably controlled - follow trent   DVT prophylaxis: heparin gtt Code Status: FULL CODE Family Communication: no family present at time of exam  Disposition Plan: ICU  Consultants:  PCCM VVS Nephrology Cardiology   Antimicrobials:  Vancomycin 6/7 > 6/11 Zosyn 6/7 > 6/11 Ceftazidime 6/11 > 6/14  Objective: Blood pressure 110/89, pulse 76, temperature 97.7 F (36.5 C), temperature source Oral, resp. rate (!) 7, height 5\' 7"  (1.702 m), weight 93 kg (205 lb 0.4 oz), SpO2 100 %.  Intake/Output Summary (Last 24 hours) at 05/05/2018 1105 Last data filed at 05/05/2018 1000 Gross per 24 hour  Intake 1478.19 ml  Output 238 ml  Net 1240.19 ml   Filed Weights   05/03/18 0500 05/04/18 0500 05/05/18 0500  Weight: 89.1 kg (196 lb 6.9 oz) 91 kg (200 lb 9.9 oz) 93 kg (205 lb 0.4 oz)   Examination: General: No acute respiratory distress Lungs: Clear to auscultation bilaterally without wheezes or crackles Cardiovascular: Regular rate and rhythm without murmur  Abdomen: Nontender, nondistended, soft, bowel sounds positive, no rebound, no ascites, no appreciable mass Extremities: No signif edema lower extremity  CBC: Recent Labs  Lab 05/02/18 2314 05/03/18 0427 05/04/18 0445  WBC 13.3* 17.3* 10.2  NEUTROABS  --  13.1*  --   HGB 7.4* 8.2* 7.7*  HCT 24.7* 27.0*  25.4*  MCV 82.3 80.6 80.9  PLT 167 262 756   Basic Metabolic Panel: Recent Labs  Lab 04/29/18 0320  05/03/18 0427 05/04/18 0445 05/05/18 0524  NA 133*   < > 135 137 136  K 3.2*   < > 3.8 4.6 4.5  CL 93*   < > 100* 100* 100*  CO2 26   < > 23 25 26   GLUCOSE 103*   < > 99 195* 189*  BUN 68*   < > 31* 36* 44*  CREATININE 7.61*   < > 5.12* 5.65* 6.09*  CALCIUM 7.0*   < > 8.0* 8.6* 8.7*  MG 1.7  --   --   --   --    < > = values in this interval not displayed.   GFR: Estimated Creatinine Clearance: 12.4 mL/min (A) (by C-G formula based on SCr of 6.09 mg/dL (H)).  Liver Function Tests: Recent Labs    Lab 05/03/18 0427  AST 14*  ALT 7*  ALKPHOS 103  BILITOT 0.4  PROT 5.4*  ALBUMIN 1.5*    Coagulation Profile: Recent Labs  Lab 05/03/18 1418  INR 1.28    Cardiac Enzymes: Recent Labs  Lab 04/30/18 1440 04/30/18 2045 05/02/18 2326 05/03/18 0427 05/03/18 1108  TROPONINI 0.91* 0.68* 0.36* 0.35* 0.30*    HbA1C: Hgb A1c MFr Bld  Date/Time Value Ref Range Status  03/24/2018 02:40 PM 8.4 (H) 4.8 - 5.6 % Final    Comment:    (NOTE) Pre diabetes:          5.7%-6.4% Diabetes:              >6.4% Glycemic control for   <7.0% adults with diabetes   11/24/2017 06:52 PM 7.2 (H) 4.8 - 5.6 % Final    Comment:    (NOTE) Pre diabetes:          5.7%-6.4% Diabetes:              >6.4% Glycemic control for   <7.0% adults with diabetes     CBG: Recent Labs  Lab 05/04/18 0800 05/04/18 1157 05/04/18 1623 05/04/18 2116 05/05/18 0816  GLUCAP 186* 178* 178* 183* 183*    Recent Results (from the past 240 hour(s))  Surgical PCR screen     Status: Abnormal   Collection Time: 04/25/18  9:02 PM  Result Value Ref Range Status   MRSA, PCR NEGATIVE NEGATIVE Final   Staphylococcus aureus POSITIVE (A) NEGATIVE Final    Comment: (NOTE) The Xpert SA Assay (FDA approved for NASAL specimens in patients 50 years of age and older), is one component of a comprehensive surveillance program. It is not intended to diagnose infection nor to guide or monitor treatment.   MRSA PCR Screening     Status: None   Collection Time: 04/26/18  8:19 AM  Result Value Ref Range Status   MRSA by PCR NEGATIVE NEGATIVE Final    Comment:        The GeneXpert MRSA Assay (FDA approved for NASAL specimens only), is one component of a comprehensive MRSA colonization surveillance program. It is not intended to diagnose MRSA infection nor to guide or monitor treatment for MRSA infections. Performed at [facility], [place] [address], [place], [place] [address]      Scheduled Meds: . aspirin   81 mg Oral Daily  . chlorhexidine  15 mL Mouth Rinse BID  . Chlorhexidine Gluconate Cloth  6 each Topical Q0600  . docusate sodium  100 mg Oral BID  . ferrous sulfate  325 mg Oral Daily  . hydrocortisone sod succinate (SOLU-CORTEF) inj  50 mg Intravenous Q6H  . insulin aspart  0-5 Units Subcutaneous QHS  . insulin aspart  0-9 Units Subcutaneous TID WC  . mouth rinse  15 mL Mouth Rinse q12n4p  . multivitamin  1 tablet Oral QHS  . multivitamin with minerals  1 tablet Oral Daily  . pantoprazole  40 mg Oral Q1200  . rosuvastatin  20 mg Oral q1800  . sodium chloride flush  10-40 mL Intracatheter Q12H  . sodium chloride flush  3 mL Intravenous Q12H     LOS: 10 days   Cherene Altes, MD [name] Office  [phone] Pager - Text Page per Shea Evans as per below:  On-Call/Text Page:      [email or web address]      password [email or web address]  If 7PM-7AM, please contact night-coverage [email or web address] Password [email or web address] 05/05/2018, 11:05 AM

## 2018-05-05 NOTE — Progress Notes (Signed)
[facility] Progress Note    Assessment/ Plan:   1. Anuric AKI from ATN, normal baseline Scr.  Started HD 04/29/18, still no evidence for recovery at this time.  Has R IJ Temp HD cath [facility] 04/28/18 which was pulled out.  IR to placed [facility] today and then plan for HD today 6/18. 2. 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 Septic shock, on hydrocortisone.  Off pressor this AM, s/p antibiotics 4. DM2 5. NSTEMI, cardiology following new depressed LVEF and RWMA on 6/11 TTE; needs LHC--> have discussed with cardiology, holding off on cath for now until potential for renal recovery more clearly elucidated 6. Hx/o AFib; RVR, on hep gtt. 7. VDRF, extubated  Subjective:    Pulled out HD cath yesterday.  For IR insertion of TDC today and then HD.     Objective:   BP (!) 110/91   Pulse 88   Temp 97.7 F (36.5 C) (Oral)   Resp 14   Ht 5\' 7"  (1.702 m)   Wt 93 kg (205 lb 0.4 oz)   SpO2 100%   BMI 32.11 kg/m   Intake/Output Summary (Last 24 hours) at 05/05/2018 1001 Last data filed at 05/05/2018 0900 Gross per 24 hour  Intake 1478.19 ml  Output 208 ml  Net 1270.19 ml   Weight change: 2 kg (4 lb 6.5 oz)  Physical Exam: ZHG:DJMEQASTMHD ill-appearing, NAD, lying in bed, awake and conversant QQI:WLNL, poor dentition with mult caries CV:RRR, no rub, normal S1 and S2 PULM:CTAB anteriorly GXQ:JJHE, nontender RDE:YCXKG lower extremity without edema, LLE s/p AKA with staple line c/d/i ACESS: R IJ Temp HD cath site dressed  Imaging: No results found.  Labs: BMET Recent Labs  Lab 04/30/18 0500 04/30/18 1440 05/01/18 0500 05/02/18 0604 05/02/18 2314 05/03/18 0427 05/04/18 0445 05/05/18 0524  NA 135  --  134* 137 138 135 137 136  K 3.1* 3.7 3.3* 3.6 4.3 3.8 4.6 4.5  CL 94*  --  98* 99* 100* 100* 100* 100*  CO2 26  --  27 29 27 23 25 26   GLUCOSE 82  --  129* 82 123* 99 195* 189*  BUN 50*  --  57* 30* 31* 31* 36* 44*  CREATININE 6.38*  --  7.04* 4.83*  4.86* 5.12* 5.65* 6.09*  CALCIUM 7.0*  --  7.4* 7.9* 7.9* 8.0* 8.6* 8.7*   CBC Recent Labs  Lab 05/02/18 0604 05/02/18 2314 05/03/18 0427 05/04/18 0445  WBC 15.0* 13.3* 17.3* 10.2  NEUTROABS  --   --  13.1*  --   HGB 8.0* 7.4* 8.2* 7.7*  HCT 25.8* 24.7* 27.0* 25.4*  MCV 79.6 82.3 80.6 80.9  PLT 183 167 262 227    Medications:    . aspirin  81 mg Oral Daily  . chlorhexidine  15 mL Mouth Rinse BID  . Chlorhexidine Gluconate Cloth  6 each Topical Q0600  . docusate sodium  100 mg Oral BID  . ferrous sulfate  325 mg Oral Daily  . hydrocortisone sod succinate (SOLU-CORTEF) inj  50 mg Intravenous Q6H  . insulin aspart  0-5 Units Subcutaneous QHS  . insulin aspart  0-9 Units Subcutaneous TID WC  . mouth rinse  15 mL Mouth Rinse q12n4p  . multivitamin  1 tablet Oral QHS  . multivitamin with minerals  1 tablet Oral Daily  . pantoprazole  40 mg Oral Q1200  . rosuvastatin  20 mg Oral q1800  . sodium chloride flush  10-40 mL  Intracatheter Q12H  . sodium chloride flush  3 mL Intravenous Q12H      Madelon Lips MD 05/05/2018, 10:01 AM

## 2018-05-05 NOTE — Progress Notes (Signed)
[facility] for Heparin  Indication: atrial fibrillation  Allergies  Allergen Reactions  . Lactose Intolerance (Gi)     Patient Measurements: Height: 5\' 7"  (170.2 cm) Weight: 205 lb 0.4 oz (93 kg) IBW/kg (Calculated) : 61.6  Vital Signs: Temp: 97.6 F (36.4 C) (06/18 1144) Temp Source: Oral (06/18 1144) BP: 117/93 (06/18 1455) Pulse Rate: 85 (06/18 1454)  Labs: Recent Labs    05/02/18 2314 05/02/18 2326 05/03/18 0427 05/03/18 1108 05/03/18 1418 05/04/18 0445 05/05/18 0524  HGB 7.4*  --  8.2*  --   --  7.7*  --   HCT 24.7*  --  27.0*  --   --  25.4*  --   PLT 167  --  262  --   --  227  --   LABPROT  --   --   --   --  15.9*  --   --   INR  --   --   --   --  1.28  --   --   HEPARINUNFRC  --   --  0.31  --   --  0.56 0.67  CREATININE 4.86*  --  5.12*  --   --  5.65* 6.09*  TROPONINI  --  0.36* 0.35* 0.30*  --   --   --     Assessment: 51 y/o F on Xarelto PTA for afib, was transitioned to heparin while inpatient and peri-operatively. 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 Heparin was resumed s/p procedure and now s/p temp HD cath  Unfortunately, the patient suffered a NSTEMI peri-operatively; therefore, heparin is being continued for dual indications. Appears that the plan is to treat the NSTEMI with medical management for the time being.   Heparin level remained therapeutic at 0.67 this AM on 1600 units/hr. No bleeding noted. Hgb has trended down to 7.7, Plt wnl  Goal of Therapy:  Heparin level 0.3-0.7 units/ml Monitor platelets by anticoagulation protocol: Yes   Plan:  Reduce IV heparin to 1550 units/hr. Stop IV heparin per IR recommendations for perm cath placement F/u resuming IV heparin post surgery  Daily heparin level and CBC  Albertina Parr, PharmD., BCPS Clinical Pharmacist Clinical phone for 05/05/18 until 3:30pm: [phone] If after 3:30pm, please call main pharmacy at: [phone]

## 2018-05-05 NOTE — Plan of Care (Signed)
  Problem: Nutrition: Goal: Adequate nutrition will be maintained Outcome: Progressing Note:  Dysphagia 3 diet, 1800 ml fluid restriction   Problem: Elimination: Goal: Will not experience complications related to bowel motility Outcome: Progressing Note:  Flexiseal in place Goal: Will not experience complications related to urinary retention Outcome: Progressing Note:  Foley in place, minimal urine output.   Problem: Pain Managment: Goal: General experience of comfort will improve Outcome: Progressing Note:  C/o back pain, improved with PRN medication (see MAR), repositioning, and back rub.   Problem: Skin Integrity: Goal: Risk for impaired skin integrity will decrease Outcome: Progressing Note:  Dressing changes per orders

## 2018-05-05 NOTE — Progress Notes (Signed)
[facility] for Heparin  Indication: atrial fibrillation and NSTEMI  Allergies  Allergen Reactions  . Lactose Intolerance (Gi)     Patient Measurements: Height: 5\' 7"  (170.2 cm) Weight: 205 lb 0.4 oz (93 kg) IBW/kg (Calculated) : 61.6  Vital Signs: Temp: 98.2 F (36.8 C) (06/18 1607) Temp Source: Oral (06/18 1607) BP: 111/89 (06/18 1500) Pulse Rate: 83 (06/18 1515)  Labs: Recent Labs    05/02/18 2314 05/02/18 2326 05/03/18 0427 05/03/18 1108 05/03/18 1418 05/04/18 0445 05/05/18 0524  HGB 7.4*  --  8.2*  --   --  7.7*  --   HCT 24.7*  --  27.0*  --   --  25.4*  --   PLT 167  --  262  --   --  227  --   LABPROT  --   --   --   --  15.9*  --   --   INR  --   --   --   --  1.28  --   --   HEPARINUNFRC  --   --  0.31  --   --  0.56 0.67  CREATININE 4.86*  --  5.12*  --   --  5.65* 6.09*  TROPONINI  --  0.36* 0.35* 0.30*  --   --   --     Assessment: 52 y/o F on Xarelto PTA for afib, was transitioned to heparin while inpatient and peri-operatively. 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 Unfortunately, the patient suffered a NSTEMI peri-operatively; therefore, heparin is being continued for dual indications. Appears that the plan is to treat the NSTEMI with medical management for the time being.   Heparin drip was held for perm cath which was placed ~15:30. Spoke with Dr. Annamaria Boots and ok to resume heparin drip at 17:00. No bleeding noted.  Goal of Therapy:  Heparin level 0.3-0.7 units/ml Monitor platelets by anticoagulation protocol: Yes   Plan:  Resume IV heparin at 1550 units/hr with no bolus at 17:00 8 hr heparin level Daily heparin level and CBC Monitor for s/sx of bleeding   Renold Genta, PharmD, BCPS Clinical Pharmacist Clinical phone for 05/05/2018 until 10p is [phone] Please check AMION for all [place] numbers 05/05/2018 4:33 PM

## 2018-05-05 NOTE — Procedures (Signed)
ARF  S/p RT IJ HD CATH  No comp Stable EBL MIN TIP SVCRA READY FOR USE FULL REPORT IN PACS

## 2018-05-05 NOTE — Progress Notes (Signed)
[facility]  Subjective  - Doing better.  CC head ache.   Objective (!) 110/91 88 97.7 F (36.5 C) (Oral) 14 100%  Intake/Output Summary (Last 24 hours) at 05/05/2018 0951 Last data filed at 05/05/2018 0900 Gross per 24 hour  Intake 1478.19 ml  Output 268 ml  Net 1210.19 ml    Left AKA incision is healing well without erythema or drainage.  Stump sock placed over incision. Right heel tenderness, float right heal.  Palpable DP pulse, active range of motion intact  Assessment/Planning: 04/26/2018 Left AKA  Acute renal failure.  IR to place new [facility] for HD cath, temp cath pulled accidentally. Left AKA disposition stable.  Plan for staples to be removed 4 weeks from surgery.   [name] 05/05/2018 9:51 AM --  Laboratory Lab Results: Recent Labs    05/03/18 0427 05/04/18 0445  WBC 17.3* 10.2  HGB 8.2* 7.7*  HCT 27.0* 25.4*  PLT 262 227   BMET Recent Labs    05/04/18 0445 05/05/18 0524  NA 137 136  K 4.6 4.5  CL 100* 100*  CO2 25 26  GLUCOSE 195* 189*  BUN 36* 44*  CREATININE 5.65* 6.09*  CALCIUM 8.6* 8.7*    COAG Lab Results  Component Value Date   INR 1.28 05/03/2018   INR 1.15 03/24/2018   INR 1.40 11/28/2017   No results found for: PTT

## 2018-05-06 ENCOUNTER — Other Ambulatory Visit: Payer: Self-pay

## 2018-05-06 ENCOUNTER — Ambulatory Visit: Payer: Medicare Other | Admitting: Internal Medicine

## 2018-05-06 ENCOUNTER — Encounter (HOSPITAL_COMMUNITY): Payer: Self-pay | Admitting: General Practice

## 2018-05-06 LAB — FOLATE: Folate: 9.8 ng/mL (ref >5.9)

## 2018-05-06 LAB — RENAL FUNCTION PANEL
Albumin: 1.9 g/dL — ABNORMAL LOW (ref 3.5–5.0)
Anion gap: 10 (ref 5–15)
BUN: 53 mg/dL — ABNORMAL HIGH (ref 6–20)
CALCIUM: 8.4 mg/dL — AB (ref 8.9–10.3)
CO2: 25 mmol/L (ref 22–32)
CREATININE: 6.58 mg/dL — AB (ref 0.44–1.00)
Chloride: 102 mmol/L (ref 101–111)
GFR calc non Af Amer: 6 mL/min — ABNORMAL LOW (ref >60)
GFR, EST AFRICAN AMERICAN: 7 mL/min — AB (ref >60)
Glucose, Bld: 189 mg/dL — ABNORMAL HIGH (ref 65–99)
Phosphorus: 7.8 mg/dL — ABNORMAL HIGH (ref 2.5–4.6)
Potassium: 4.3 mmol/L (ref 3.5–5.1)
SODIUM: 137 mmol/L (ref 135–145)

## 2018-05-06 LAB — CBC
HCT: 25.8 % — ABNORMAL LOW (ref 36.0–46.0)
Hemoglobin: 7.7 g/dL — ABNORMAL LOW (ref 12.0–15.0)
MCH: 24.6 pg — AB (ref 26.0–34.0)
MCHC: 29.8 g/dL — AB (ref 30.0–36.0)
MCV: 82.4 fL (ref 78.0–100.0)
PLATELETS: 265 10*3/uL (ref 150–400)
RBC: 3.13 MIL/uL — AB (ref 3.87–5.11)
RDW: 18.7 % — ABNORMAL HIGH (ref 11.5–15.5)
WBC: 14 10*3/uL — ABNORMAL HIGH (ref 4.0–10.5)

## 2018-05-06 LAB — COMPREHENSIVE METABOLIC PANEL
ALBUMIN: 1.9 g/dL — AB (ref 3.5–5.0)
ALT: 10 U/L — ABNORMAL LOW (ref 14–54)
ANION GAP: 10 (ref 5–15)
AST: 11 U/L — AB (ref 15–41)
Alkaline Phosphatase: 88 U/L (ref 38–126)
BILIRUBIN TOTAL: 0.2 mg/dL — AB (ref 0.3–1.2)
BUN: 52 mg/dL — AB (ref 6–20)
CO2: 26 mmol/L (ref 22–32)
Calcium: 8.4 mg/dL — ABNORMAL LOW (ref 8.9–10.3)
Chloride: 102 mmol/L (ref 101–111)
Creatinine, Ser: 6.65 mg/dL — ABNORMAL HIGH (ref 0.44–1.00)
GFR calc Af Amer: 7 mL/min — ABNORMAL LOW (ref >60)
GFR calc non Af Amer: 6 mL/min — ABNORMAL LOW (ref >60)
GLUCOSE: 191 mg/dL — AB (ref 65–99)
POTASSIUM: 4.3 mmol/L (ref 3.5–5.1)
SODIUM: 138 mmol/L (ref 135–145)
TOTAL PROTEIN: 5.4 g/dL — AB (ref 6.5–8.1)

## 2018-05-06 LAB — AMMONIA: Ammonia: <9 umol/L — ABNORMAL LOW (ref 9–35)

## 2018-05-06 LAB — HEPARIN LEVEL (UNFRACTIONATED): Heparin Unfractionated: 0.48 IU/mL (ref 0.30–0.70)

## 2018-05-06 LAB — GLUCOSE, CAPILLARY
GLUCOSE-CAPILLARY: 122 mg/dL — AB (ref 65–99)
GLUCOSE-CAPILLARY: 125 mg/dL — AB (ref 65–99)
Glucose-Capillary: 122 mg/dL — ABNORMAL HIGH (ref 65–99)
Glucose-Capillary: 101 mg/dL — ABNORMAL HIGH (ref 65–99)

## 2018-05-06 LAB — VITAMIN B12: Vitamin B-12: 462 pg/mL (ref 180–914)

## 2018-05-06 MED ORDER — SODIUM CHLORIDE 0.9 % IV BOLUS
250.0000 mL | Freq: Once | INTRAVENOUS | Status: AC
  Administered 2018-05-06: 250 mL via INTRAVENOUS

## 2018-05-06 MED ORDER — PHENYLEPHRINE HCL-NACL 10-0.9 MG/250ML-% IV SOLN
0.0000 ug/min | INTRAVENOUS | Status: DC
  Administered 2018-05-06: 20 ug/min via INTRAVENOUS
  Filled 2018-05-06: qty 250

## 2018-05-06 NOTE — Plan of Care (Signed)
  Problem: Pain Managment: Goal: General experience of comfort will improve Outcome: Progressing   

## 2018-05-06 NOTE — Progress Notes (Signed)
ANTICOAGULATION CONSULT NOTE - Follow Up Consult  Pharmacy Consult for heparin Indication: Afib and NSTEMI  Labs: Recent Labs    05/03/18 0427 05/03/18 1108 05/03/18 1418 05/04/18 0445 05/05/18 0524 05/06/18 0041  HGB 8.2*  --   --  7.7*  --   --   HCT 27.0*  --   --  25.4*  --   --   PLT 262  --   --  227  --   --   LABPROT  --   --  15.9*  --   --   --   INR  --   --  1.28  --   --   --   HEPARINUNFRC 0.31  --   --  0.56 0.67 0.48  CREATININE 5.12*  --   --  5.65* 6.09*  --   TROPONINI 0.35* 0.30*  --   --   --   --     Assessment/Plan:  52yo female therapeutic on heparin after resumed. Will continue gtt at current rate and monitor daily level.   Wynona Neat, PharmD, BCPS  05/06/2018,1:19 AM

## 2018-05-06 NOTE — Progress Notes (Signed)
[facility] Progress Note    Assessment/ Plan:   1. Anuric AKI from ATN, normal baseline Scr.  Started HD 04/29/18, still no evidence for recovery at this time.  s/p TDC with IR 6/19, pulled out temp cath. HD 6/19.  Will assess need for HD tomorrow. 2. 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 Septic shock, on hydrocortisone.  Off pressor, s/p antibiotics.  Was apparently on phenylephrine 20 for HD- doesn't appear she needs this, MAPs were more than adequate during HD. 4. DM2 5. NSTEMI, cardiology following new depressed LVEF and RWMA on 6/11 TTE; needs LHC--> have discussed with cardiology, holding off on cath for now until potential for renal recovery more clearly elucidated 6. Hx/o AFib; RVR, on hep gtt. 7. VDRF, extubated 8. Dispo: pending assessment of renal recovery and cardiac workup  Subjective:    S/p TDC yesterday and HD early this AM- vol removal 2.6L.  Still a little confused this AM but better.   Objective:   BP 115/62 (BP Location: Right Arm)   Pulse 87   Temp (!) 97.5 F (36.4 C) (Oral)   Resp 16   Ht 5\' 7"  (1.702 m)   Wt 93.1 kg (205 lb 4 oz)   SpO2 100%   BMI 32.15 kg/m   Intake/Output Summary (Last 24 hours) at 05/06/2018 0948 Last data filed at 05/06/2018 0900 Gross per 24 hour  Intake 1058.59 ml  Output 2833 ml  Net -1774.41 ml   Weight change: 2.1 kg (4 lb 10.1 oz)  Physical Exam: OTL:XBWIOMBTDHR ill-appearing, NAD, lying in bed, awake and conversant CBU:LAGT, poor dentition with mult caries CV:RRR, no rub, normal S1 and S2 CHEST: R IJ TDC in place PULM:CTAB anteriorly XMI:WOEH, nontender OZY:YQMGN lower extremity without edema, LLE s/p AKA with staple line c/d/i  Imaging: Ir Fluoro Guide Cv Line Right  Result Date: 05/05/2018 INDICATION: Acute kidney injury, no current access for dialysis EXAM: ULTRASOUND GUIDANCE FOR VASCULAR ACCESS RIGHT INTERNAL JUGULAR PERMANENT HEMODIALYSIS CATHETER Date:  05/05/2018 05/05/2018  3:15 pm Radiologist:  Jerilynn Mages. Daryll Brod, MD Guidance:  Ultrasound fluoroscopic FLUOROSCOPY TIME:  Fluoroscopy Time: 0 minutes 24 seconds (2 mGy). MEDICATIONS: Ancef 2 g administered within 1 hour of procedure ANESTHESIA/SEDATION: Versed 0.5 mg IV; Fentanyl 12.5 mcg IV; Moderate Sedation Time:  18 minutes The patient was continuously monitored during the procedure by the interventional radiology nurse under my direct supervision. CONTRAST:  None. COMPLICATIONS: None immediate. PROCEDURE: Informed consent was obtained from the patient following explanation of the procedure, risks, benefits and alternatives. The patient understands, agrees and consents for the procedure. All questions were addressed. A time out was performed. Maximal barrier sterile technique utilized including caps, mask, sterile gowns, sterile gloves, large sterile drape, hand hygiene, and 2% chlorhexidine scrub. Under sterile conditions and local anesthesia, right internal jugular micropuncture venous access was performed with ultrasound. Images were obtained for documentation of the patent right internal jugular vein. A guide wire was inserted followed by a transitional dilator. Next, a 0.035 guidewire was advanced into the IVC with a 5-French catheter. Measurements were obtained from the right venotomy site to the proximal right atrium. In the right infraclavicular chest, a subcutaneous tunnel was created under sterile conditions and local anesthesia. 1% lidocaine with epinephrine was utilized for this. The 19 cm tip to cuff dialysis catheter was tunneled subcutaneously to the venotomy site and inserted into the SVC/RA junction through a valved peel-away sheath. Position was confirmed with fluoroscopy. Images were obtained for  documentation. Blood was aspirated from the catheter followed by saline and heparin flushes. The appropriate volume and strength of heparin was instilled in each lumen. Caps were applied. The catheter was secured at the tunnel  site with Gelfoam and a pursestring suture. The venotomy site was closed with subcuticular Vicryl suture. Dermabond was applied to the small right neck incision. A dry sterile dressing was applied. The catheter is ready for use. No immediate complications. IMPRESSION: Ultrasound and fluoroscopically guided right internal jugular tunneled hemodialysis catheter (19 cm tip to cuff dialysis catheter). Electronically Signed   By: Jerilynn Mages.  Shick M.D.   On: 05/05/2018 15:25   Ir US Guide Vasc Access Right  Result Date: 05/05/2018 INDICATION: Acute kidney injury, no current access for dialysis EXAM: ULTRASOUND GUIDANCE FOR VASCULAR ACCESS RIGHT INTERNAL JUGULAR PERMANENT HEMODIALYSIS CATHETER Date:  05/05/2018 05/05/2018 3:15 pm Radiologist:  Jerilynn Mages. Daryll Brod, MD Guidance:  Ultrasound fluoroscopic FLUOROSCOPY TIME:  Fluoroscopy Time: 0 minutes 24 seconds (2 mGy). MEDICATIONS: Ancef 2 g administered within 1 hour of procedure ANESTHESIA/SEDATION: Versed 0.5 mg IV; Fentanyl 12.5 mcg IV; Moderate Sedation Time:  18 minutes The patient was continuously monitored during the procedure by the interventional radiology nurse under my direct supervision. CONTRAST:  None. COMPLICATIONS: None immediate. PROCEDURE: Informed consent was obtained from the patient following explanation of the procedure, risks, benefits and alternatives. The patient understands, agrees and consents for the procedure. All questions were addressed. A time out was performed. Maximal barrier sterile technique utilized including caps, mask, sterile gowns, sterile gloves, large sterile drape, hand hygiene, and 2% chlorhexidine scrub. Under sterile conditions and local anesthesia, right internal jugular micropuncture venous access was performed with ultrasound. Images were obtained for documentation of the patent right internal jugular vein. A guide wire was inserted followed by a transitional dilator. Next, a 0.035 guidewire was advanced into the IVC with a 5-French  catheter. Measurements were obtained from the right venotomy site to the proximal right atrium. In the right infraclavicular chest, a subcutaneous tunnel was created under sterile conditions and local anesthesia. 1% lidocaine with epinephrine was utilized for this. The 19 cm tip to cuff dialysis catheter was tunneled subcutaneously to the venotomy site and inserted into the SVC/RA junction through a valved peel-away sheath. Position was confirmed with fluoroscopy. Images were obtained for documentation. Blood was aspirated from the catheter followed by saline and heparin flushes. The appropriate volume and strength of heparin was instilled in each lumen. Caps were applied. The catheter was secured at the tunnel site with Gelfoam and a pursestring suture. The venotomy site was closed with subcuticular Vicryl suture. Dermabond was applied to the small right neck incision. A dry sterile dressing was applied. The catheter is ready for use. No immediate complications. IMPRESSION: Ultrasound and fluoroscopically guided right internal jugular tunneled hemodialysis catheter (19 cm tip to cuff dialysis catheter). Electronically Signed   By: Jerilynn Mages.  Shick M.D.   On: 05/05/2018 15:25    Labs: BMET Recent Labs  Lab 05/01/18 0500 05/02/18 0604 05/02/18 2314 05/03/18 0427 05/04/18 0445 05/05/18 0524 05/06/18 0345  NA 134* 137 138 135 137 136 138  137  K 3.3* 3.6 4.3 3.8 4.6 4.5 4.3  4.3  CL 98* 99* 100* 100* 100* 100* 102  102  CO2 27 29 27 23 25 26 26  25   GLUCOSE 129* 82 123* 99 195* 189* 191*  189*  BUN 57* 30* 31* 31* 36* 44* 52*  53*  CREATININE 7.04* 4.83* 4.86* 5.12* 5.65* 6.09*  6.65*  6.58*  CALCIUM 7.4* 7.9* 7.9* 8.0* 8.6* 8.7* 8.4*  8.4*  PHOS  --   --   --   --   --   --  7.8*   CBC Recent Labs  Lab 05/02/18 2314 05/03/18 0427 05/04/18 0445 05/06/18 0345  WBC 13.3* 17.3* 10.2 14.0*  NEUTROABS  --  13.1*  --   --   HGB 7.4* 8.2* 7.7* 7.7*  HCT 24.7* 27.0* 25.4* 25.8*  MCV 82.3 80.6  80.9 82.4  PLT 167 262 227 265    Medications:    . aspirin  81 mg Oral Daily  . chlorhexidine  15 mL Mouth Rinse BID  . Chlorhexidine Gluconate Cloth  6 each Topical Q0600  . docusate sodium  100 mg Oral BID  . ferrous sulfate  325 mg Oral Daily  . insulin aspart  0-5 Units Subcutaneous QHS  . insulin aspart  0-9 Units Subcutaneous TID WC  . mouth rinse  15 mL Mouth Rinse q12n4p  . multivitamin  1 tablet Oral QHS  . pantoprazole  40 mg Oral Q1200  . rosuvastatin  20 mg Oral q1800  . sodium chloride flush  10-40 mL Intracatheter Q12H      Madelon Lips MD 05/06/2018, 9:48 AM

## 2018-05-06 NOTE — Progress Notes (Signed)
Fentanyl 4mL wasted in sink, witnessed by Latricia Heft, RN.

## 2018-05-06 NOTE — Progress Notes (Signed)
HD tx initiated via HD cath w/o problem, bilat ports pull/push/flush equally w/o problem, VSS but w/ soft bp and below the parameters ordered for HD tx, primary nurse restarted  Neo drip for HD tx, will cont to monitor while on HD tx

## 2018-05-06 NOTE — Progress Notes (Signed)
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).  She also had a NSTEMI with new cardiomyopathy.     Subjective: 6/19 4, negative S OB, negative abdominal pain, negative CP.  States not on home O2.    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)   PAD (peripheral artery disease) (HCC)   Pressure injury of skin   DM2 (diabetes mellitus, type 2) (HCC)   Acute kidney injury ([place])   Buttock wound, left, subsequent encounter   Paroxysmal atrial fibrillation with rapid ventricular response (HCC)   Lower limb ischemia   Microcytic anemia   Gangrene of lower extremity (HCC)   Acute renal failure (HCC)   Acute respiratory failure (HCC)   Sepsis (HCC)   Acute encephalopathy   Acute combined systolic and diastolic heart failure (HCC)   Non-ST elevation (NSTEMI) myocardial infarction ([place])   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  Would monitor closely patient's WBCs trended up today.  Recent spikes fever will panculture  Acute kidney failure due to ATN - Secondary to sepsis - Pulled out temporary HD cath 6/17  - 6/18 S/P right IJ HD cath placed - HD/CRRT per nephrology    Acute respiratory failure with hypoxia -Titrate O2 to maintain SPO2 > 93%   NSTEMI -Cardiology was following has signed off  6/19 -Will require cardiac catheterization.  Waiting for patient's kidneys to advance to ESRD or to recover prior to performing cardiac catheterization.    Acute systolic and diastolic CHF - 1/82 echocardiogram EF 30 to 35% with grade 2 diastolic dysfunction.   -strict in and out since admission +20.8 L -Daily weight Filed Weights   05/06/18 0330 05/06/18 0337 05/06/18 0800  Weight: 209 lb 10.5 oz (95.1 kg) 209 lb 10.5 oz (95.1 kg) 205 lb 4 oz (93.1 kg)  -Transfuse for hemoglobin<8   Paroxysmal A. fib with RVR -Resolved - NSR - Heparin drip  Acute delirium/Developmental delay -Per cardiology who is familiar with patient patient's baseline is that of an 79-year-old -Acute delirium resolved?    Dysphasia - Dysphagia 3 diet   Anemia -No evidence of acute bleed -Anemia panel pending -Occult blood pending     Diabetes type 2 uncontrolled with complication -9/[id number] A1c=8.4  - Lipid panel in June within ADA guidelines      DVT prophylaxis: Heparin drip Code Status: Full Family Communication: Son present at bedside for discussion of plan of care Disposition Plan: TBD   Consultants:  PCCM Cardiology    Procedures/Significant Events:  6/11 echocardiogram: Left ventricle: LVEF= 30% to 35%.-Grade 2 diastolic dysfunction.  - Right ventricle: moderately dilated.  - Pulmonary arteries:  PA peak pressure: 34 mm Hg (S). - Pericardium, extracardiac: A trivial pericardial effusion     I have personally reviewed and interpreted all radiology studies and my findings are as above.  VENTILATOR SETTINGS:    Cultures      Antimicrobials: Anti-infectives (From admission, onward)   Start     Stop   05/05/18 1354  ceFAZolin (ANCEF) 2-4 GM/100ML-% IVPB  Status:  Discontinued    Note to Pharmacy:  Gar Ponto   : cabinet override   05/05/18 1451   05/05/18 0930  ceFAZolin (ANCEF) IVPB 2g/100 mL premix     05/06/18 0930   04/30/18 0900  vancomycin (VANCOCIN) IVPB  750 mg/150 ml premix  Status:  Discontinued     04/30/18 0828   04/28/18 1400  cefTAZidime (FORTAZ) 1 g in sodium chloride 0.9 % 100 mL IVPB     05/01/18 1416   04/26/18 0600  vancomycin (VANCOCIN) IVPB 1000 mg/200 mL premix  Status:  Discontinued     04/28/18 1152   04/25/18 1000  vancomycin (VANCOCIN) IVPB 1000 mg/200 mL premix  Status:  Discontinued     04/25/18 0215   04/25/18 0600  piperacillin-tazobactam (ZOSYN) IVPB 3.375 g  Status:  Discontinued     04/25/18 0220   04/25/18 0600  piperacillin-tazobactam (ZOSYN) IVPB 2.25 g  Status:  Discontinued     04/28/18 1152   04/24/18 2100  piperacillin-tazobactam (ZOSYN) IVPB 3.375 g     04/24/18 2242   04/24/18 2100  vancomycin (VANCOCIN) IVPB 1000 mg/200 mL premix     04/24/18 2352      Devices   LINES / TUBES:  LEFT IJ CVL??>> RIGHT IJ HD cath 6/18>>    Continuous Infusions: . sodium chloride    . sodium chloride    . sodium chloride 10 mL/hr at 05/06/18 0600  .  ceFAZolin (ANCEF) IV    . heparin 1,550 Units/hr (05/06/18 0600)  . phenylephrine (NEO-SYNEPHRINE) Adult infusion Stopped (05/06/18 0832)     Objective: Vitals:   05/06/18 0715 05/06/18 0730 05/06/18 0733 05/06/18 0800  BP: (!) 143/74 (!) 145/81  115/62  Pulse: 84 83 82 90  Resp: 16 14 10 18   Temp:    (!) 97.5 F (36.4 C)  TempSrc:    Oral  SpO2: 100% 100% 100% 100%  Weight:    205 lb 4 oz (93.1 kg)  Height:        Intake/Output Summary (Last 24 hours) at 05/06/2018 0840 Last data filed at 05/06/2018 2703 Gross per 24 hour  Intake 953.91 ml  Output 2833 ml  Net -1879.09 ml   Filed Weights   05/06/18 0330 05/06/18 0337 05/06/18 0800  Weight: 209 lb 10.5 oz (95.1 kg) 209 lb 10.5 oz (95.1 kg) 205 lb 4 oz (93.1 kg)    Examination:  General: A/O x4, No acute respiratory distress ENT: Negative Runny nose, negative gingival bleeding, EXTREMELY poor dentation Neck:  Negative scars, masses, torticollis, lymphadenopathy, JVD, left IJ CVL covered and  clean negative sign of infection Lungs: Clear to auscultation bilaterally without wheezes or crackles Cardiovascular: Regular rate and rhythm without murmur gallop or rub normal S1 and S2 Abdomen: negative abdominal pain, nondistended, positive soft, bowel sounds, no rebound, no ascites, no appreciable mass Extremities: RIGHT No significant cyanosis, clubbing.  LEFT AKA covered and clean did not remove bandage.,  Skin: Negative rashes, lesions, ulcers Psychiatric:  Negative depression, negative anxiety, negative fatigue, negative mania, EXTREMELY POOR understanding of her multiple medical problems Central nervous system:  Cranial nerves II through XII intact, tongue/uvula midline, all extremities muscle strength 5/5, sensation intact throughout, negative dysarthria, negative expressive aphasia, negative receptive aphasia.  Marland Kitchen  Data Reviewed: Care during the described time interval was provided by me .  I have reviewed this patient's available data, including medical history, events of note, physical examination, and all test results as part of my evaluation.   CBC: Recent Labs  Lab 05/02/18 0604 05/02/18 2314 05/03/18 0427 05/04/18 0445 05/06/18 0345  WBC 15.0* 13.3* 17.3* 10.2 14.0*  NEUTROABS  --   --  13.1*  --   --   HGB 8.0* 7.4* 8.2* 7.7* 7.7*  HCT 25.8* 24.7* 27.0* 25.4* 25.8*  MCV 79.6 82.3 80.6 80.9 82.4  PLT 183 167 262 227 253   Basic Metabolic Panel: Recent Labs  Lab 05/02/18 2314 05/03/18 0427 05/04/18 0445 05/05/18 0524 05/06/18 0345  NA 138 135 137 136 138  137  K 4.3 3.8 4.6 4.5 4.3  4.3  CL 100* 100* 100* 100* 102  102  CO2 27 23 25 26 26  25   GLUCOSE 123* 99 195* 189* 191*  189*  BUN 31* 31* 36* 44* 52*  53*  CREATININE 4.86* 5.12* 5.65* 6.09* 6.65*  6.58*  CALCIUM 7.9* 8.0* 8.6* 8.7* 8.4*  8.4*  PHOS  --   --   --   --  7.8*   GFR: Estimated Creatinine Clearance: 11.3 mL/min (A) (by C-G formula based on SCr of 6.65 mg/dL (H)). Liver  Function Tests: Recent Labs  Lab 05/03/18 0427 05/06/18 0345  AST 14* 11*  ALT 7* 10*  ALKPHOS 103 88  BILITOT 0.4 0.2*  PROT 5.4* 5.4*  ALBUMIN 1.5* 1.9*  1.9*   No results for input(s): LIPASE, AMYLASE in the last 168 hours. Recent Labs  Lab 05/06/18 0347  AMMONIA <9*   Coagulation Profile: Recent Labs  Lab 05/03/18 1418  INR 1.28   Cardiac Enzymes: Recent Labs  Lab 04/30/18 1440 04/30/18 2045 05/02/18 2326 05/03/18 0427 05/03/18 1108  TROPONINI 0.91* 0.68* 0.36* 0.35* 0.30*   BNP (last 3 results) No results for input(s): PROBNP in the last 8760 hours. HbA1C: No results for input(s): HGBA1C in the last 72 hours. CBG: Recent Labs  Lab 05/05/18 0816 05/05/18 1142 05/05/18 1602 05/05/18 2107 05/06/18 0811  GLUCAP 183* 172* 143* 150* 122*   Lipid Profile: No results for input(s): CHOL, HDL, LDLCALC, TRIG, CHOLHDL, LDLDIRECT in the last 72 hours. Thyroid Function Tests: No results for input(s): TSH, T4TOTAL, FREET4, T3FREE, THYROIDAB in the last 72 hours. Anemia Panel: Recent Labs    05/06/18 0347  FOLATE 9.8   Urine analysis:    Component Value Date/Time   COLORURINE YELLOW 04/25/2018 0407   APPEARANCEUR CLOUDY (A) 04/25/2018 0407   LABSPEC 1.012 04/25/2018 0407   PHURINE 5.0 04/25/2018 0407   GLUCOSEU NEGATIVE 04/25/2018 0407   HGBUR MODERATE (A) 04/25/2018 0407   BILIRUBINUR NEGATIVE 04/25/2018 0407   KETONESUR NEGATIVE 04/25/2018 0407   PROTEINUR 100 (A) 04/25/2018 0407   UROBILINOGEN 0.2 06/25/2011 1707   NITRITE NEGATIVE 04/25/2018 0407   LEUKOCYTESUR NEGATIVE 04/25/2018 0407   Sepsis Labs: @LABRCNTIP (procalcitonin:4,lacticidven:4)  )No results found for this or any previous visit (from the past 240 hour(s)).       Radiology Studies: Ir Fluoro Guide Cv Line Right  Result Date: 05/05/2018 INDICATION: Acute kidney injury, no current access for dialysis EXAM: ULTRASOUND GUIDANCE FOR VASCULAR ACCESS RIGHT INTERNAL JUGULAR PERMANENT  HEMODIALYSIS CATHETER Date:  05/05/2018 05/05/2018 3:15 pm Radiologist:  Jerilynn Mages. Daryll Brod, MD Guidance:  Ultrasound fluoroscopic FLUOROSCOPY TIME:  Fluoroscopy Time: 0 minutes 24 seconds (2 mGy). MEDICATIONS: Ancef 2  g administered within 1 hour of procedure ANESTHESIA/SEDATION: Versed 0.5 mg IV; Fentanyl 12.5 mcg IV; Moderate Sedation Time:  18 minutes The patient was continuously monitored during the procedure by the interventional radiology nurse under my direct supervision. CONTRAST:  None. COMPLICATIONS: None immediate. PROCEDURE: Informed consent was obtained from the patient following explanation of the procedure, risks, benefits and alternatives. The patient understands, agrees and consents for the procedure. All questions were addressed. A time out was performed. Maximal barrier sterile technique utilized including caps, mask, sterile gowns, sterile gloves, large sterile drape, hand hygiene, and 2% chlorhexidine scrub. Under sterile conditions and local anesthesia, right internal jugular micropuncture venous access was performed with ultrasound. Images were obtained for documentation of the patent right internal jugular vein. A guide wire was inserted followed by a transitional dilator. Next, a 0.035 guidewire was advanced into the IVC with a 5-French catheter. Measurements were obtained from the right venotomy site to the proximal right atrium. In the right infraclavicular chest, a subcutaneous tunnel was created under sterile conditions and local anesthesia. 1% lidocaine with epinephrine was utilized for this. The 19 cm tip to cuff dialysis catheter was tunneled subcutaneously to the venotomy site and inserted into the SVC/RA junction through a valved peel-away sheath. Position was confirmed with fluoroscopy. Images were obtained for documentation. Blood was aspirated from the catheter followed by saline and heparin flushes. The appropriate volume and strength of heparin was instilled in each lumen. Caps  were applied. The catheter was secured at the tunnel site with Gelfoam and a pursestring suture. The venotomy site was closed with subcuticular Vicryl suture. Dermabond was applied to the small right neck incision. A dry sterile dressing was applied. The catheter is ready for use. No immediate complications. IMPRESSION: Ultrasound and fluoroscopically guided right internal jugular tunneled hemodialysis catheter (19 cm tip to cuff dialysis catheter). Electronically Signed   By: Jerilynn Mages.  Shick M.D.   On: 05/05/2018 15:25   Ir US Guide Vasc Access Right  Result Date: 05/05/2018 INDICATION: Acute kidney injury, no current access for dialysis EXAM: ULTRASOUND GUIDANCE FOR VASCULAR ACCESS RIGHT INTERNAL JUGULAR PERMANENT HEMODIALYSIS CATHETER Date:  05/05/2018 05/05/2018 3:15 pm Radiologist:  Jerilynn Mages. Daryll Brod, MD Guidance:  Ultrasound fluoroscopic FLUOROSCOPY TIME:  Fluoroscopy Time: 0 minutes 24 seconds (2 mGy). MEDICATIONS: Ancef 2 g administered within 1 hour of procedure ANESTHESIA/SEDATION: Versed 0.5 mg IV; Fentanyl 12.5 mcg IV; Moderate Sedation Time:  18 minutes The patient was continuously monitored during the procedure by the interventional radiology nurse under my direct supervision. CONTRAST:  None. COMPLICATIONS: None immediate. PROCEDURE: Informed consent was obtained from the patient following explanation of the procedure, risks, benefits and alternatives. The patient understands, agrees and consents for the procedure. All questions were addressed. A time out was performed. Maximal barrier sterile technique utilized including caps, mask, sterile gowns, sterile gloves, large sterile drape, hand hygiene, and 2% chlorhexidine scrub. Under sterile conditions and local anesthesia, right internal jugular micropuncture venous access was performed with ultrasound. Images were obtained for documentation of the patent right internal jugular vein. A guide wire was inserted followed by a transitional dilator. Next, a 0.035  guidewire was advanced into the IVC with a 5-French catheter. Measurements were obtained from the right venotomy site to the proximal right atrium. In the right infraclavicular chest, a subcutaneous tunnel was created under sterile conditions and local anesthesia. 1% lidocaine with epinephrine was utilized for this. The 19 cm tip to cuff dialysis catheter was tunneled subcutaneously to the venotomy site  and inserted into the SVC/RA junction through a valved peel-away sheath. Position was confirmed with fluoroscopy. Images were obtained for documentation. Blood was aspirated from the catheter followed by saline and heparin flushes. The appropriate volume and strength of heparin was instilled in each lumen. Caps were applied. The catheter was secured at the tunnel site with Gelfoam and a pursestring suture. The venotomy site was closed with subcuticular Vicryl suture. Dermabond was applied to the small right neck incision. A dry sterile dressing was applied. The catheter is ready for use. No immediate complications. IMPRESSION: Ultrasound and fluoroscopically guided right internal jugular tunneled hemodialysis catheter (19 cm tip to cuff dialysis catheter). Electronically Signed   By: Jerilynn Mages.  Shick M.D.   On: 05/05/2018 15:25        Scheduled Meds: . aspirin  81 mg Oral Daily  . chlorhexidine  15 mL Mouth Rinse BID  . Chlorhexidine Gluconate Cloth  6 each Topical Q0600  . docusate sodium  100 mg Oral BID  . ferrous sulfate  325 mg Oral Daily  . insulin aspart  0-5 Units Subcutaneous QHS  . insulin aspart  0-9 Units Subcutaneous TID WC  . mouth rinse  15 mL Mouth Rinse q12n4p  . multivitamin  1 tablet Oral QHS  . pantoprazole  40 mg Oral Q1200  . rosuvastatin  20 mg Oral q1800  . sodium chloride flush  10-40 mL Intracatheter Q12H   Continuous Infusions: . sodium chloride    . sodium chloride    . sodium chloride 10 mL/hr at 05/06/18 0600  .  ceFAZolin (ANCEF) IV    . heparin 1,550 Units/hr  (05/06/18 0600)  . phenylephrine (NEO-SYNEPHRINE) Adult infusion Stopped (05/06/18 0832)     LOS: 11 days    Time spent: 40 minutes    WOODS, Geraldo Docker, MD [name] Pager [phone]   If 7PM-7AM, please contact night-coverage [email or web address] Password [email or web address] 05/06/2018, 8:40 AM

## 2018-05-06 NOTE — Progress Notes (Addendum)
Progress Note  Patient Name: [name] Date of Encounter: 05/06/2018  Primary Cardiologist: Nelva Bush, MD  Subjective   States she is "determined to fight this fight." No CP or SOB. Overall no complaints. Remains oliguric. Neo weaned.  Inpatient Medications    Scheduled Meds: . aspirin  81 mg Oral Daily  . chlorhexidine  15 mL Mouth Rinse BID  . Chlorhexidine Gluconate Cloth  6 each Topical Q0600  . docusate sodium  100 mg Oral BID  . ferrous sulfate  325 mg Oral Daily  . insulin aspart  0-5 Units Subcutaneous QHS  . insulin aspart  0-9 Units Subcutaneous TID WC  . mouth rinse  15 mL Mouth Rinse q12n4p  . multivitamin  1 tablet Oral QHS  . pantoprazole  40 mg Oral Q1200  . rosuvastatin  20 mg Oral q1800  . sodium chloride flush  10-40 mL Intracatheter Q12H   Continuous Infusions: . sodium chloride    . sodium chloride    . sodium chloride 10 mL/hr at 05/06/18 1100  . heparin 1,550 Units/hr (05/06/18 1100)  . phenylephrine (NEO-SYNEPHRINE) Adult infusion Stopped (05/06/18 0831)   PRN Meds: sodium chloride, sodium chloride, acetaminophen, alteplase, fentaNYL (SUBLIMAZE) injection, heparin, nitroGLYCERIN, ondansetron (ZOFRAN) IV, oxyCODONE, phenol, sodium chloride flush   Vital Signs    Vitals:   05/06/18 0900 05/06/18 1000 05/06/18 1100 05/06/18 1115  BP: (!) 117/59 114/65 114/67 123/71  Pulse: 87 85 87 85  Resp: 16 14 12 18   Temp:    97.8 F (36.6 C)  TempSrc:    Oral  SpO2: 100% 100% 96% 100%  Weight:      Height:        Intake/Output Summary (Last 24 hours) at 05/06/2018 1242 Last data filed at 05/06/2018 1100 Gross per 24 hour  Intake 1109.67 ml  Output 2803 ml  Net -1693.33 ml   Filed Weights   05/06/18 0330 05/06/18 0337 05/06/18 0800  Weight: 209 lb 10.5 oz (95.1 kg) 209 lb 10.5 oz (95.1 kg) 205 lb 4 oz (93.1 kg)    Telemetry    NSR - Personally Reviewed  Physical Exam   GEN: No acute distress.  HEENT: Normocephalic, atraumatic,  sclera non-icteric. Neck: No JVD or bruits. L IJ present. Cardiac: RRR no murmurs, rubs, or gallops.  Radials/DP/PT 1+ and equal bilaterally.  Respiratory: Clear to auscultation bilaterally. Breathing is unlabored. GI: Soft, nontender, non-distended, BS +x 4. MS: no deformity. Extremities: No clubbing or cyanosis. No [name] edema. S/p LLE amputation.  Neuro:  AAOx3. Follows commands. Psych:  Responds to questions appropriately with a normal affect but seems to have simpler understanding of her medical issues (I agree prior assessments of poor insight have been accurate)  Labs    Chemistry Recent Labs  Lab 05/03/18 0427 05/04/18 0445 05/05/18 0524 05/06/18 0345  NA 135 137 136 138  137  K 3.8 4.6 4.5 4.3  4.3  CL 100* 100* 100* 102  102  CO2 23 25 26 26  25   GLUCOSE 99 195* 189* 191*  189*  BUN 31* 36* 44* 52*  53*  CREATININE 5.12* 5.65* 6.09* 6.65*  6.58*  CALCIUM 8.0* 8.6* 8.7* 8.4*  8.4*  PROT 5.4*  --   --  5.4*  ALBUMIN 1.5*  --   --  1.9*  1.9*  AST 14*  --   --  11*  ALT 7*  --   --  10*  ALKPHOS 103  --   --  88  BILITOT 0.4  --   --  0.2*  GFRNONAA 9* 8* 7* 6*  6*  GFRAA 10* 9* 8* 7*  7*  ANIONGAP 12 12 10 10  10      Hematology Recent Labs  Lab 05/03/18 0427 05/04/18 0445 05/06/18 0345  WBC 17.3* 10.2 14.0*  RBC 3.35* 3.14* 3.13*  HGB 8.2* 7.7* 7.7*  HCT 27.0* 25.4* 25.8*  MCV 80.6 80.9 82.4  MCH 24.5* 24.5* 24.6*  MCHC 30.4 30.3 29.8*  RDW 18.2* 18.2* 18.7*  PLT 262 227 265    Cardiac Enzymes Recent Labs  Lab 04/30/18 2045 05/02/18 2326 05/03/18 0427 05/03/18 1108  TROPONINI 0.68* 0.36* 0.35* 0.30*   No results for input(s): TROPIPOC in the last 168 hours.   BNPNo results for input(s): BNP, PROBNP in the last 168 hours.   DDimer No results for input(s): DDIMER in the last 168 hours.   Radiology    Ir Fluoro Guide Cv Line Right  Result Date: 05/05/2018 INDICATION: Acute kidney injury, no current access for dialysis EXAM:  ULTRASOUND GUIDANCE FOR VASCULAR ACCESS RIGHT INTERNAL JUGULAR PERMANENT HEMODIALYSIS CATHETER Date:  05/05/2018 05/05/2018 3:15 pm Radiologist:  Jerilynn Mages. Daryll Brod, MD Guidance:  Ultrasound fluoroscopic FLUOROSCOPY TIME:  Fluoroscopy Time: 0 minutes 24 seconds (2 mGy). MEDICATIONS: Ancef 2 g administered within 1 hour of procedure ANESTHESIA/SEDATION: Versed 0.5 mg IV; Fentanyl 12.5 mcg IV; Moderate Sedation Time:  18 minutes The patient was continuously monitored during the procedure by the interventional radiology nurse under my direct supervision. CONTRAST:  None. COMPLICATIONS: None immediate. PROCEDURE: Informed consent was obtained from the patient following explanation of the procedure, risks, benefits and alternatives. The patient understands, agrees and consents for the procedure. All questions were addressed. A time out was performed. Maximal barrier sterile technique utilized including caps, mask, sterile gowns, sterile gloves, large sterile drape, hand hygiene, and 2% chlorhexidine scrub. Under sterile conditions and local anesthesia, right internal jugular micropuncture venous access was performed with ultrasound. Images were obtained for documentation of the patent right internal jugular vein. A guide wire was inserted followed by a transitional dilator. Next, a 0.035 guidewire was advanced into the IVC with a 5-French catheter. Measurements were obtained from the right venotomy site to the proximal right atrium. In the right infraclavicular chest, a subcutaneous tunnel was created under sterile conditions and local anesthesia. 1% lidocaine with epinephrine was utilized for this. The 19 cm tip to cuff dialysis catheter was tunneled subcutaneously to the venotomy site and inserted into the SVC/RA junction through a valved peel-away sheath. Position was confirmed with fluoroscopy. Images were obtained for documentation. Blood was aspirated from the catheter followed by saline and heparin flushes. The  appropriate volume and strength of heparin was instilled in each lumen. Caps were applied. The catheter was secured at the tunnel site with Gelfoam and a pursestring suture. The venotomy site was closed with subcuticular Vicryl suture. Dermabond was applied to the small right neck incision. A dry sterile dressing was applied. The catheter is ready for use. No immediate complications. IMPRESSION: Ultrasound and fluoroscopically guided right internal jugular tunneled hemodialysis catheter (19 cm tip to cuff dialysis catheter). Electronically Signed   By: Jerilynn Mages.  Shick M.D.   On: 05/05/2018 15:25   Ir US Guide Vasc Access Right  Result Date: 05/05/2018 INDICATION: Acute kidney injury, no current access for dialysis EXAM: ULTRASOUND GUIDANCE FOR VASCULAR ACCESS RIGHT INTERNAL JUGULAR PERMANENT HEMODIALYSIS CATHETER Date:  05/05/2018 05/05/2018 3:15 pm Radiologist:  Jerilynn Mages. Daryll Brod,  MD Guidance:  Ultrasound fluoroscopic FLUOROSCOPY TIME:  Fluoroscopy Time: 0 minutes 24 seconds (2 mGy). MEDICATIONS: Ancef 2 g administered within 1 hour of procedure ANESTHESIA/SEDATION: Versed 0.5 mg IV; Fentanyl 12.5 mcg IV; Moderate Sedation Time:  18 minutes The patient was continuously monitored during the procedure by the interventional radiology nurse under my direct supervision. CONTRAST:  None. COMPLICATIONS: None immediate. PROCEDURE: Informed consent was obtained from the patient following explanation of the procedure, risks, benefits and alternatives. The patient understands, agrees and consents for the procedure. All questions were addressed. A time out was performed. Maximal barrier sterile technique utilized including caps, mask, sterile gowns, sterile gloves, large sterile drape, hand hygiene, and 2% chlorhexidine scrub. Under sterile conditions and local anesthesia, right internal jugular micropuncture venous access was performed with ultrasound. Images were obtained for documentation of the patent right internal jugular  vein. A guide wire was inserted followed by a transitional dilator. Next, a 0.035 guidewire was advanced into the IVC with a 5-French catheter. Measurements were obtained from the right venotomy site to the proximal right atrium. In the right infraclavicular chest, a subcutaneous tunnel was created under sterile conditions and local anesthesia. 1% lidocaine with epinephrine was utilized for this. The 19 cm tip to cuff dialysis catheter was tunneled subcutaneously to the venotomy site and inserted into the SVC/RA junction through a valved peel-away sheath. Position was confirmed with fluoroscopy. Images were obtained for documentation. Blood was aspirated from the catheter followed by saline and heparin flushes. The appropriate volume and strength of heparin was instilled in each lumen. Caps were applied. The catheter was secured at the tunnel site with Gelfoam and a pursestring suture. The venotomy site was closed with subcuticular Vicryl suture. Dermabond was applied to the small right neck incision. A dry sterile dressing was applied. The catheter is ready for use. No immediate complications. IMPRESSION: Ultrasound and fluoroscopically guided right internal jugular tunneled hemodialysis catheter (19 cm tip to cuff dialysis catheter). Electronically Signed   By: Jerilynn Mages.  Shick M.D.   On: 05/05/2018 15:25    Cardiac Studies   2D echo 04/28/18 Study Conclusions - Left ventricle: The cavity size was normal. There was mild   concentric hypertrophy. Systolic function was moderately to   severely reduced. The estimated ejection fraction was in the   range of 30% to 35%. Features are consistent with a pseudonormal   left ventricular filling pattern, with concomitant abnormal   relaxation and increased filling pressure (grade 2 diastolic   dysfunction). Doppler parameters are consistent with elevated   ventricular end-diastolic filling pressure. - Mitral valve: Calcified annulus. Moderately thickened,  moderately   calcified leaflets . There was mild regurgitation. - Left atrium: The atrium was mildly dilated. - Right ventricle: The cavity size was moderately dilated. Wall   thickness was normal. Systolic function was moderately reduced. - Tricuspid valve: There was mild regurgitation. - Pulmonary arteries: Systolic pressure was mildly increased. PA   peak pressure: 34 mm Hg (S). - Pericardium, extracardiac: A trivial pericardial effusion was   identified. Features were not consistent with tamponade   physiology Impressions: - When compared to the prior study from 04/17/2017 LVEF has   decreased from 55-60% to 30-35% with new wall motion   abnormalities - diffuse hypokinesis and akinesis of the basal and   mid anteroseptal, anterior and apical septal and anterior walls.   RVEF is moderately decreased.  Patient Profile     52 y.o. 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 H/o chronic wounds of left foot with prior ABF and Fem-Pop Bypass, recently admitted from May 10-14 for redo of left femoral to PTA bypass with cryopreserved vein. Was seen in vascular office 6/3 and was febrile/hypotensive -> advised to go to ER as she was felt to require limb amputation but patient declined, citing she wanted to go on pre-planned camping trip. Was admitted with septic shock due to LLE gangrene with multiorgan dysfunction - hypotension requiring pressors, renal failure requiring initiation of dialysis, elevated troponin of 7.7, and new LV dysfunction with drop in EF to 30-35% with new WMA. 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 Also found to have recurrent paroxysmal atrial fib during admission.  Assessment & Plan    1. Septic shock with multiple organ involvement as above - being managed by primary team. Neo weaned. Renal failure persists.  2. New  cardiomyopathy - consider low dose beta blocker in future days as pressure allows. No ACEI/ARB/spiro at present time given renal failure. See below re: cath. Overall appears euvolemic. Volume currently begin managed with HD.  3. NSTEMI - suspected demand ischemia due to septic shock with high level of suspicion for underlying CAD. Plan cardiac cath for evaluation of coronaries once renal function improves or she is clearly ESRD. Continue heparin, aspirin, statin. Would not add Plavix given concomitant need for [facility] for afib as well.  4. Paroxysmal atrial fib - prior h/o such, maintaining NSR. Continue heparin for now until plan for #3 is elucidated.  For questions or updates, please contact [place] Please consult [email or web address] for contact info under Cardiology/STEMI.  Signed, Charlie Pitter, PA-C 05/06/2018, 12:42 PM    I have seen and examined the patient along with Charlie Pitter, PA-C.  I have reviewed the chart, notes and new data.  I agree with PA's note.  Key new complaints: no CV complaints Key examination changes: clear hypervolemia (weight up >30 lb since admission), but without pulmonary rales or tachypnea, maintaining normal rhythm. Key new findings / data: remains oligoanuric (300-350 mL daily) with markedly abnormal renal parameters.  PLAN: As described before, she very likely has extensive CAD and will need coronary angiography, but it is best to delay contrast based procedures until renal function recovers or until waiting for renal improvement appears to be futile (i.e. declared ESRD) Would like to try to introduce low dose carvedilol, as BP allows in the next few days.  Sanda Klein, MD, [place] [phone] 05/06/2018, 1:39 PM

## 2018-05-06 NOTE — Care Management Note (Signed)
Case Management Note  Patient Details  Name: [name] MRN: [id number] Date of Birth: Aug 30, 1966  Subjective/Objective:   Pt admitted with septic shock - pt had a recent fem pop bypass and now has new occulsion              Action/Plan:  PTA from home with husband.  Pt is intubated requiring CRRT.  CM will continue to follow for discharge needs   Expected Discharge Date:                  Expected Discharge Plan:     In-House Referral:  Clinical Social Work  Discharge planning Services  CM Consult  Post Acute Care Choice:    Choice offered to:     DME Arranged:    DME Agency:     HH Arranged:    [place] Agency:     Status of Service:     If discussed at [employer], dates discussed:    Additional Comments: 05/06/2018 Pt will have [place] placed today 6/19.  Daily assessment for daily HD - no renal recovery as of yet.  Pt recently started on pressors, remains on heparin.  CM will continue to follow Jamie Labrador, RN 05/06/2018, 1:11 PM

## 2018-05-06 NOTE — Progress Notes (Signed)
Completed HD trmt without difficulty.  VS WNL  Post trmt.  Report given to Harriet Butte, RN.

## 2018-05-06 NOTE — Progress Notes (Signed)
Pt. With BP 80/58. Alert and stable. On call for [facility] paged to make aware. Will continue to monitor pt. For changes.

## 2018-05-07 DIAGNOSIS — E118 Type 2 diabetes mellitus with unspecified complications: Secondary | ICD-10-CM

## 2018-05-07 DIAGNOSIS — R6250 Unspecified lack of expected normal physiological development in childhood: Secondary | ICD-10-CM

## 2018-05-07 DIAGNOSIS — E1165 Type 2 diabetes mellitus with hyperglycemia: Secondary | ICD-10-CM

## 2018-05-07 LAB — RETICULOCYTES
RBC.: 2.81 MIL/uL — AB (ref 3.87–5.11)
RETIC COUNT ABSOLUTE: 50.6 10*3/uL (ref 19.0–186.0)
Retic Ct Pct: 1.8 % (ref 0.4–3.1)

## 2018-05-07 LAB — CBC
HCT: 23.1 % — ABNORMAL LOW (ref 36.0–46.0)
HCT: 23.1 % — ABNORMAL LOW (ref 36.0–46.0)
Hemoglobin: 7 g/dL — ABNORMAL LOW (ref 12.0–15.0)
Hemoglobin: 6.9 g/dL — CL (ref 12.0–15.0)
MCH: 24.7 pg — ABNORMAL LOW (ref 26.0–34.0)
MCH: 24.6 pg — ABNORMAL LOW (ref 26.0–34.0)
MCHC: 30.3 g/dL (ref 30.0–36.0)
MCHC: 29.9 g/dL — ABNORMAL LOW (ref 30.0–36.0)
MCV: 81.6 fL (ref 78.0–100.0)
MCV: 82.2 fL (ref 78.0–100.0)
PLATELETS: 225 10*3/uL (ref 150–400)
PLATELETS: 230 10*3/uL (ref 150–400)
RBC: 2.83 MIL/uL — ABNORMAL LOW (ref 3.87–5.11)
RBC: 2.81 MIL/uL — AB (ref 3.87–5.11)
RDW: 18.5 % — ABNORMAL HIGH (ref 11.5–15.5)
RDW: 18.6 % — AB (ref 11.5–15.5)
WBC: 13.1 10*3/uL — AB (ref 4.0–10.5)
WBC: 13.4 10*3/uL — AB (ref 4.0–10.5)

## 2018-05-07 LAB — IRON AND TIBC
Iron: 29 ug/dL (ref 28–170)
Saturation Ratios: 14 % (ref 10.4–31.8)
TIBC: 214 ug/dL — ABNORMAL LOW (ref 250–450)
UIBC: 185 ug/dL

## 2018-05-07 LAB — PHOSPHORUS: PHOSPHORUS: 4.8 mg/dL — AB (ref 2.5–4.6)

## 2018-05-07 LAB — BASIC METABOLIC PANEL
Anion gap: 8 (ref 5–15)
BUN: 32 mg/dL — ABNORMAL HIGH (ref 6–20)
CO2: 26 mmol/L (ref 22–32)
Calcium: 7.8 mg/dL — ABNORMAL LOW (ref 8.9–10.3)
Chloride: 102 mmol/L (ref 101–111)
Creatinine, Ser: 4.69 mg/dL — ABNORMAL HIGH (ref 0.44–1.00)
GFR, EST AFRICAN AMERICAN: 11 mL/min — AB (ref >60)
GFR, EST NON AFRICAN AMERICAN: 10 mL/min — AB (ref >60)
Glucose, Bld: 113 mg/dL — ABNORMAL HIGH (ref 65–99)
POTASSIUM: 3.6 mmol/L (ref 3.5–5.1)
SODIUM: 136 mmol/L (ref 135–145)

## 2018-05-07 LAB — FERRITIN: Ferritin: 234 ng/mL (ref 11–307)

## 2018-05-07 LAB — FOLATE: FOLATE: 6.1 ng/mL (ref >5.9)

## 2018-05-07 LAB — HEPARIN LEVEL (UNFRACTIONATED): Heparin Unfractionated: 0.45 IU/mL (ref 0.30–0.70)

## 2018-05-07 LAB — GLUCOSE, CAPILLARY
GLUCOSE-CAPILLARY: 108 mg/dL — AB (ref 65–99)
GLUCOSE-CAPILLARY: 98 mg/dL (ref 65–99)
Glucose-Capillary: 91 mg/dL (ref 65–99)

## 2018-05-07 LAB — VITAMIN B12: VITAMIN B 12: 418 pg/mL (ref 180–914)

## 2018-05-07 LAB — OCCULT BLOOD X 1 CARD TO LAB, STOOL: FECAL OCCULT BLD: POSITIVE — AB

## 2018-05-07 LAB — MAGNESIUM: MAGNESIUM: 1.6 mg/dL — AB (ref 1.7–2.4)

## 2018-05-07 LAB — PREPARE RBC (CROSSMATCH)

## 2018-05-07 MED ORDER — FUROSEMIDE 10 MG/ML IJ SOLN
80.0000 mg | Freq: Once | INTRAMUSCULAR | Status: AC
  Administered 2018-05-08: 80 mg via INTRAVENOUS
  Filled 2018-05-07: qty 8

## 2018-05-07 MED ORDER — DARBEPOETIN ALFA 100 MCG/0.5ML IJ SOSY
100.0000 ug | PREFILLED_SYRINGE | INTRAMUSCULAR | Status: DC
  Administered 2018-05-07: 100 ug via INTRAVENOUS
  Filled 2018-05-07: qty 0.5

## 2018-05-07 MED ORDER — SODIUM CHLORIDE 0.9% IV SOLUTION: Freq: Once | INTRAVENOUS | Status: DC

## 2018-05-07 MED ORDER — PANTOPRAZOLE SODIUM 40 MG PO TBEC
40.0000 mg | DELAYED_RELEASE_TABLET | Freq: Two times a day (BID) | ORAL | Status: DC
  Administered 2018-05-08 – 2018-05-22 (×29): 40 mg via ORAL
  Filled 2018-05-07 (×31): qty 1

## 2018-05-07 MED ORDER — DARBEPOETIN ALFA 100 MCG/0.5ML IJ SOSY
PREFILLED_SYRINGE | INTRAMUSCULAR | Status: AC
  Filled 2018-05-07: qty 0.5

## 2018-05-07 MED ORDER — FERROUS SULFATE 325 (65 FE) MG PO TABS
325.0000 mg | ORAL_TABLET | Freq: Two times a day (BID) | ORAL | Status: DC
  Administered 2018-05-08 – 2018-05-11 (×7): 325 mg via ORAL
  Filled 2018-05-07 (×7): qty 1

## 2018-05-07 MED ORDER — HEPARIN SODIUM (PORCINE) 5000 UNIT/ML IJ SOLN
5000.0000 [IU] | Freq: Three times a day (TID) | INTRAMUSCULAR | Status: DC
  Administered 2018-05-08 – 2018-05-22 (×40): 5000 [IU] via SUBCUTANEOUS
  Filled 2018-05-07 (×41): qty 1

## 2018-05-07 NOTE — Progress Notes (Signed)
[facility] Progress Note    Assessment/ Plan:   1. Anuric AKI from ATN, normal baseline Scr.  Started HD 04/29/18, still no evidence for recovery at this time.  s/p TDC with IR 6/19, pulled out temp cath. HD 6/19 and today 6/20, will assess renal recovery- nothing really meaningful yet  2. 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 Septic shock, on hydrocortisone.  Off pressor, s/p antibiotics.   4. DM2 5. NSTEMI, cardiology following new depressed LVEF and RWMA on 6/11 TTE; needs LHC--> have discussed with cardiology, holding off on cath for now until potential for renal recovery more clearly elucidated 6. Hx/o AFib; RVR, on hep gtt. 7. VDRF, extubated 8. Dispo: pending assessment of renal recovery and cardiac workup  Subjective:    On HD today.   Objective:   BP 115/66 (BP Location: Right Arm)   Pulse 91   Temp 98.2 F (36.8 C) (Oral)   Resp 17   Ht 5\' 7"  (1.702 m)   Wt 92.9 kg (204 lb 12.9 oz)   SpO2 100%   BMI 32.08 kg/m   Intake/Output Summary (Last 24 hours) at 05/07/2018 1149 Last data filed at 05/07/2018 0955 Gross per 24 hour  Intake 1048.04 ml  Output -  Net 1048.04 ml   Weight change: -2 kg (-4 lb 6.6 oz)  Physical Exam: WGY:[id number] ill-appearing, NAD, lying in bed, awake and conversant LTJ:QZES, poor dentition with mult caries CV:RRR, no rub, normal S1 and S2 CHEST: R IJ TDC in place PULM:CTAB anteriorly PQZ:RAQT, nontender MAU:QJFHL lower extremity without edema, LLE s/p AKA with staple line c/d/i  Imaging: Ir Fluoro Guide Cv Line Right  Result Date: 05/05/2018 INDICATION: Acute kidney injury, no current access for dialysis EXAM: ULTRASOUND GUIDANCE FOR VASCULAR ACCESS RIGHT INTERNAL JUGULAR PERMANENT HEMODIALYSIS CATHETER Date:  05/05/2018 05/05/2018 3:15 pm Radiologist:  Jerilynn Mages. Daryll Brod, MD Guidance:  Ultrasound fluoroscopic FLUOROSCOPY TIME:  Fluoroscopy Time: 0 minutes 24 seconds (2 mGy). MEDICATIONS: Ancef 2 g  administered within 1 hour of procedure ANESTHESIA/SEDATION: Versed 0.5 mg IV; Fentanyl 12.5 mcg IV; Moderate Sedation Time:  18 minutes The patient was continuously monitored during the procedure by the interventional radiology nurse under my direct supervision. CONTRAST:  None. COMPLICATIONS: None immediate. PROCEDURE: Informed consent was obtained from the patient following explanation of the procedure, risks, benefits and alternatives. The patient understands, agrees and consents for the procedure. All questions were addressed. A time out was performed. Maximal barrier sterile technique utilized including caps, mask, sterile gowns, sterile gloves, large sterile drape, hand hygiene, and 2% chlorhexidine scrub. Under sterile conditions and local anesthesia, right internal jugular micropuncture venous access was performed with ultrasound. Images were obtained for documentation of the patent right internal jugular vein. A guide wire was inserted followed by a transitional dilator. Next, a 0.035 guidewire was advanced into the IVC with a 5-French catheter. Measurements were obtained from the right venotomy site to the proximal right atrium. In the right infraclavicular chest, a subcutaneous tunnel was created under sterile conditions and local anesthesia. 1% lidocaine with epinephrine was utilized for this. The 19 cm tip to cuff dialysis catheter was tunneled subcutaneously to the venotomy site and inserted into the SVC/RA junction through a valved peel-away sheath. Position was confirmed with fluoroscopy. Images were obtained for documentation. Blood was aspirated from the catheter followed by saline and heparin flushes. The appropriate volume and strength of heparin was instilled in each lumen. Caps were applied. The catheter was secured  at the tunnel site with Gelfoam and a pursestring suture. The venotomy site was closed with subcuticular Vicryl suture. Dermabond was applied to the small right neck incision. A  dry sterile dressing was applied. The catheter is ready for use. No immediate complications. IMPRESSION: Ultrasound and fluoroscopically guided right internal jugular tunneled hemodialysis catheter (19 cm tip to cuff dialysis catheter). Electronically Signed   By: Jerilynn Mages.  Shick M.D.   On: 05/05/2018 15:25   Ir US Guide Vasc Access Right  Result Date: 05/05/2018 INDICATION: Acute kidney injury, no current access for dialysis EXAM: ULTRASOUND GUIDANCE FOR VASCULAR ACCESS RIGHT INTERNAL JUGULAR PERMANENT HEMODIALYSIS CATHETER Date:  05/05/2018 05/05/2018 3:15 pm Radiologist:  Jerilynn Mages. Daryll Brod, MD Guidance:  Ultrasound fluoroscopic FLUOROSCOPY TIME:  Fluoroscopy Time: 0 minutes 24 seconds (2 mGy). MEDICATIONS: Ancef 2 g administered within 1 hour of procedure ANESTHESIA/SEDATION: Versed 0.5 mg IV; Fentanyl 12.5 mcg IV; Moderate Sedation Time:  18 minutes The patient was continuously monitored during the procedure by the interventional radiology nurse under my direct supervision. CONTRAST:  None. COMPLICATIONS: None immediate. PROCEDURE: Informed consent was obtained from the patient following explanation of the procedure, risks, benefits and alternatives. The patient understands, agrees and consents for the procedure. All questions were addressed. A time out was performed. Maximal barrier sterile technique utilized including caps, mask, sterile gowns, sterile gloves, large sterile drape, hand hygiene, and 2% chlorhexidine scrub. Under sterile conditions and local anesthesia, right internal jugular micropuncture venous access was performed with ultrasound. Images were obtained for documentation of the patent right internal jugular vein. A guide wire was inserted followed by a transitional dilator. Next, a 0.035 guidewire was advanced into the IVC with a 5-French catheter. Measurements were obtained from the right venotomy site to the proximal right atrium. In the right infraclavicular chest, a subcutaneous tunnel was  created under sterile conditions and local anesthesia. 1% lidocaine with epinephrine was utilized for this. The 19 cm tip to cuff dialysis catheter was tunneled subcutaneously to the venotomy site and inserted into the SVC/RA junction through a valved peel-away sheath. Position was confirmed with fluoroscopy. Images were obtained for documentation. Blood was aspirated from the catheter followed by saline and heparin flushes. The appropriate volume and strength of heparin was instilled in each lumen. Caps were applied. The catheter was secured at the tunnel site with Gelfoam and a pursestring suture. The venotomy site was closed with subcuticular Vicryl suture. Dermabond was applied to the small right neck incision. A dry sterile dressing was applied. The catheter is ready for use. No immediate complications. IMPRESSION: Ultrasound and fluoroscopically guided right internal jugular tunneled hemodialysis catheter (19 cm tip to cuff dialysis catheter). Electronically Signed   By: Jerilynn Mages.  Shick M.D.   On: 05/05/2018 15:25    Labs: BMET Recent Labs  Lab 05/02/18 0604 05/02/18 2314 05/03/18 0427 05/04/18 0445 05/05/18 0524 05/06/18 0345 05/07/18 0418  NA 137 138 135 137 136 138  137 136  K 3.6 4.3 3.8 4.6 4.5 4.3  4.3 3.6  CL 99* 100* 100* 100* 100* 102  102 102  CO2 29 27 23 25 26 26  25 26   GLUCOSE 82 123* 99 195* 189* 191*  189* 113*  BUN 30* 31* 31* 36* 44* 52*  53* 32*  CREATININE 4.83* 4.86* 5.12* 5.65* 6.09* 6.65*  6.58* 4.69*  CALCIUM 7.9* 7.9* 8.0* 8.6* 8.7* 8.4*  8.4* 7.8*  PHOS  --   --   --   --   --  7.8*  4.8*   CBC Recent Labs  Lab 05/03/18 0427 05/04/18 0445 05/06/18 0345 05/07/18 0418 05/07/18 1012  WBC 17.3* 10.2 14.0* 13.1* 13.4*  NEUTROABS 13.1*  --   --   --   --   HGB 8.2* 7.7* 7.7* 7.0* 6.9*  HCT 27.0* 25.4* 25.8* 23.1* 23.1*  MCV 80.6 80.9 82.4 81.6 82.2  PLT 262 227 265 225 230    Medications:    . aspirin  81 mg Oral Daily  . chlorhexidine  15 mL  Mouth Rinse BID  . Chlorhexidine Gluconate Cloth  6 each Topical Q0600  . darbepoetin (ARANESP) injection - DIALYSIS  100 mcg Intravenous Q Thu-HD  . docusate sodium  100 mg Oral BID  . ferrous sulfate  325 mg Oral Daily  . insulin aspart  0-5 Units Subcutaneous QHS  . insulin aspart  0-9 Units Subcutaneous TID WC  . mouth rinse  15 mL Mouth Rinse q12n4p  . multivitamin  1 tablet Oral QHS  . pantoprazole  40 mg Oral Q1200  . rosuvastatin  20 mg Oral q1800  . sodium chloride flush  10-40 mL Intracatheter Q12H      Madelon Lips MD 05/07/2018, 11:49 AM

## 2018-05-07 NOTE — Progress Notes (Signed)
Cardiology called- patient had positive fecal occult.   Hgb has dropped to 6.9 while in dialysis.  Page MD from [facility] to let know.

## 2018-05-07 NOTE — Progress Notes (Signed)
Patient Demographics:    [name], is a 52 y.o. female, DOB - 06/10/1966, HER:[id number]  Admit date - 04/24/2018   Admitting Physician [name], MD  Outpatient Primary MD for the patient is [name], MD  LOS - 12   Chief Complaint  Patient presents with  . 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)   PAD (peripheral artery disease) (HCC)   Pressure injury of skin   DM2 (diabetes mellitus, type 2) (HCC)   Acute kidney injury ([place])   Buttock wound, left, subsequent encounter   Paroxysmal atrial fibrillation with rapid ventricular response (HCC)   Lower limb ischemia   Microcytic anemia   Gangrene of lower extremity (HCC)   Acute renal failure (HCC)   Acute respiratory failure (HCC)   Sepsis (HCC)   Acute encephalopathy   Acute combined systolic and diastolic heart failure (HCC)   Non-ST elevation (NSTEMI) myocardial infarction [facility])   Brief Narrative:  52 y/o WF  PMHx  Depression, Diabetes mellitus type II uncontrolled with complication, Diabetic neuropathy ,  Femoral-popliteal bypass graft occlusion, 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).She also had a NSTEMI with new cardiomyopathy.   Plan:- 1)Acute on chronic Anemia--hemoglobin 6.9, discussed with [name], Please transfuse 1 unit of packed cells over 3 to 4 hours,, please give Lasix 80 mg x 1 IV after transfusion of packed cells, Hemoccult  positive, heparin drip has been discontinued by cardiology team, given CAD try to keep hemoglobin at least close to 9, received Aranesp on 05/07/18, x  2)NSTEMI--suspect due to demand ischemia in the setting of septic shock, clinically high index of suspicion for underlying CAD-cardiology input appreciated, IV heparin drip discontinued after 48 hours per cardiology team, cardiology team will consider low-dose beta-blocker if blood pressure allows, at this time patient unable to tolerate ACE/ARB/Aldactone due to renal concerns, c/n aspirin and crestor  3)AKI/ATN-renal failure in the setting of sepsis with multiorgan failure and persistent hypotension requiring pressors, no significant renal recovery, limited to no urine output, hemodialysis was started on 04/29/2018, last hemodialysis session on 05/07/2018,  4)PAFib--- this is not new, unable to anticoagulate due to #1 above  5)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,  6)HFrEF--patient with combined systolic and diastolic dysfunction CHF, echo with EF of 35% with grade 2 diastolic dysfunction, at this time patient unable to tolerate ACE/ARB/Aldactone due to renal concerns, continue to use hemodialysis to address volume status  7) developmental delay/cognitive deficits--- baseline patient has cognitive deficits which have worsened since acute infection  8)DM2-poor control, A1c 8.4, Use Novolog/Humalog Sliding scale insulin with Accu-Cheks/Fingersticks as ordered   Code Status : Full  Disposition Plan  : TBD  Consults  :  PCCM/cardiology  DVT Prophylaxis  :  Heparin [place]  Lab Results  Component Value Date   PLT 230 05/07/2018    Inpatient Medications  Scheduled Meds: . sodium chloride   Intravenous Once  . aspirin  81 mg Oral Daily  . chlorhexidine  15 mL Mouth Rinse BID  . Chlorhexidine Gluconate Cloth  6 each Topical Q0600  . darbepoetin (ARANESP) injection - DIALYSIS  100 mcg Intravenous Q Thu-HD  . docusate sodium  100 mg Oral  BID  . ferrous sulfate  325 mg Oral BID WC  . furosemide  80 mg Intravenous Once  . insulin aspart  0-5 Units Subcutaneous QHS  . insulin aspart  0-9 Units Subcutaneous TID WC  . mouth rinse  15 mL Mouth Rinse q12n4p  . multivitamin  1 tablet Oral QHS  . pantoprazole  40 mg Oral Q1200  . rosuvastatin  20 mg Oral q1800  . sodium chloride flush  10-40 mL Intracatheter Q12H   Continuous Infusions: . sodium chloride 10 mL/hr at 05/06/18 1800   PRN Meds:.acetaminophen, alteplase, fentaNYL (SUBLIMAZE) injection, heparin, nitroGLYCERIN, ondansetron (ZOFRAN) IV, oxyCODONE, phenol, sodium chloride flush    Anti-infectives (From admission, onward)   Start     Dose/Rate Route Frequency Ordered Stop   05/05/18 1354  ceFAZolin (ANCEF) 2-4 GM/100ML-% IVPB  Status:  Discontinued    Note to Pharmacy:  Gar Ponto   : cabinet override      05/05/18 1354 05/05/18 1451   05/05/18 0930  ceFAZolin (ANCEF) IVPB 2g/100 mL premix     2 g 200 mL/hr over 30 Minutes Intravenous On call 05/05/18 0924 05/06/18 0930   04/30/18 0900  vancomycin (VANCOCIN) IVPB 750 mg/150 ml premix  Status:  Discontinued     750 mg 150 mL/hr over 60 Minutes Intravenous  Once 04/30/18 0819 04/30/18 0828   04/28/18 1400  cefTAZidime (FORTAZ) 1 g in sodium chloride 0.9 % 100 mL IVPB     1 g 200 mL/hr over 30 Minutes Intravenous Every 24 hours 04/28/18 1301 05/01/18 1416   04/26/18 0600  vancomycin (VANCOCIN) IVPB 1000 mg/200 mL premix  Status:  Discontinued     1,000 mg 200 mL/hr over 60 Minutes Intravenous Every 48 hours 04/26/18 0515 04/28/18 1152   04/25/18 1000  vancomycin (VANCOCIN) IVPB 1000 mg/200 mL premix  Status:  Discontinued     1,000 mg 200 mL/hr over 60 Minutes Intravenous Every 12 hours 04/24/18 2213 04/25/18 0215   04/25/18 0600  piperacillin-tazobactam (ZOSYN) IVPB 3.375 g  Status:  Discontinued     3.375 g 12.5 mL/hr over 240 Minutes Intravenous Every 8 hours 04/24/18 2213 04/25/18 0220   04/25/18 0600   piperacillin-tazobactam (ZOSYN) IVPB 2.25 g  Status:  Discontinued     2.25 g 100 mL/hr over 30 Minutes Intravenous Every 8 hours 04/25/18 0220 04/28/18 1152   04/24/18 2100  piperacillin-tazobactam (ZOSYN) IVPB 3.375 g     3.375 g 100 mL/hr over 30 Minutes Intravenous  Once 04/24/18 2050 04/24/18 2242   04/24/18 2100  vancomycin (VANCOCIN) IVPB 1000 mg/200 mL premix     1,000 mg 200 mL/hr over 60 Minutes Intravenous  Once 04/24/18 2050 04/24/18 2352        Objective:   Vitals:   05/07/18 1245 05/07/18 1315 05/07/18 1330 05/07/18 1600  BP: (!) 119/56 115/62 119/68 103/62  Pulse: 97 94 96 94  Resp: 17 18 17 16   Temp:   98.4 F (36.9 C) 98.1 F (36.7 C)  TempSrc:   Oral Oral  SpO2:  99% 100%  Weight:      Height:        Wt Readings from Last 3 Encounters:  05/07/18 92.9 kg (204 lb 12.9 oz)  04/20/18 72.6 kg (160 lb)  03/31/18 79.4 kg (175 lb)     Intake/Output Summary (Last 24 hours) at 05/07/2018 1651 Last data filed at 05/07/2018 1600 Gross per 24 hour  Intake 1130.55 ml  Output 2365 ml  Net -1234.45 ml     Physical Exam  Gen:- Awake Alert,  In no apparent distress  HEENT:- [place].AT, No sclera icterus Neck-Supple Neck,No JVD, LT IJ cathe, Rt IJ HD catheter.  Lungs-  CTAB , good air movement  CV- S1, S2 normal Abd-  +ve B.Sounds, Abd Soft, No tenderness,    Extremity/Skin:- LT AKA Psych-affect is appropriate, oriented x3 Neuro-no new focal deficits, no tremors GU Foley-to be removed Rectal Tube-okay to leave in for now  Data Review:   Micro Results No results found for this or any previous visit (from the past 240 hour(s)).  Radiology Reports US Renal  Result Date: 04/25/2018 CLINICAL DATA:  Initial evaluation for acute renal injury. EXAM: RENAL / URINARY TRACT ULTRASOUND COMPLETE COMPARISON:  Prior CT from 11/30/2017 FINDINGS: Right Kidney: Length: 14.4 cm. Echogenicity within normal limits. No mass or hydronephrosis visualized. Left Kidney: Length: 13.7  cm. Echogenicity within normal limits. No mass or hydronephrosis visualized. Bladder: Bladder decompressed with a Foley catheter in place. IMPRESSION: 1. Negative renal ultrasound.  No hydronephrosis. 2. Bladder decompressed with a Foley catheter in place. Electronically Signed   By: Jeannine Boga M.D.   On: 04/25/2018 06:55   Ir Fluoro Guide Cv Line Right  Result Date: 05/05/2018 INDICATION: Acute kidney injury, no current access for dialysis EXAM: ULTRASOUND GUIDANCE FOR VASCULAR ACCESS RIGHT INTERNAL JUGULAR PERMANENT HEMODIALYSIS CATHETER Date:  05/05/2018 05/05/2018 3:15 pm Radiologist:  Jerilynn Mages. Daryll Brod, MD Guidance:  Ultrasound fluoroscopic FLUOROSCOPY TIME:  Fluoroscopy Time: 0 minutes 24 seconds (2 mGy). MEDICATIONS: Ancef 2 g administered within 1 hour of procedure ANESTHESIA/SEDATION: Versed 0.5 mg IV; Fentanyl 12.5 mcg IV; Moderate Sedation Time:  18 minutes The patient was continuously monitored during the procedure by the interventional radiology nurse under my direct supervision. CONTRAST:  None. COMPLICATIONS: None immediate. PROCEDURE: Informed consent was obtained from the patient following explanation of the procedure, risks, benefits and alternatives. The patient understands, agrees and consents for the procedure. All questions were addressed. A time out was performed. Maximal barrier sterile technique utilized including caps, mask, sterile gowns, sterile gloves, large sterile drape, hand hygiene, and 2% chlorhexidine scrub. Under sterile conditions and local anesthesia, right internal jugular micropuncture venous access was performed with ultrasound. Images were obtained for documentation of the patent right internal jugular vein. A guide wire was inserted followed by a transitional dilator. Next, a 0.035 guidewire was advanced into the IVC with a 5-French catheter. Measurements were obtained from the right venotomy site to the proximal right atrium. In the right infraclavicular chest, a  subcutaneous tunnel was created under sterile conditions and local anesthesia. 1% lidocaine with epinephrine was utilized for this. The 19 cm tip to cuff dialysis catheter was tunneled subcutaneously to the venotomy site and inserted into the SVC/RA junction through a valved peel-away sheath. Position was confirmed with fluoroscopy. Images were obtained for documentation. Blood was aspirated from the catheter followed by saline and heparin flushes. The appropriate volume and strength of heparin was instilled in each lumen. Caps were applied. The catheter was secured at  the tunnel site with Gelfoam and a pursestring suture. The venotomy site was closed with subcuticular Vicryl suture. Dermabond was applied to the small right neck incision. A dry sterile dressing was applied. The catheter is ready for use. No immediate complications. IMPRESSION: Ultrasound and fluoroscopically guided right internal jugular tunneled hemodialysis catheter (19 cm tip to cuff dialysis catheter). Electronically Signed   By: Jerilynn Mages.  Shick M.D.   On: 05/05/2018 15:25   Ir US Guide Vasc Access Right  Result Date: 05/05/2018 INDICATION: Acute kidney injury, no current access for dialysis EXAM: ULTRASOUND GUIDANCE FOR VASCULAR ACCESS RIGHT INTERNAL JUGULAR PERMANENT HEMODIALYSIS CATHETER Date:  05/05/2018 05/05/2018 3:15 pm Radiologist:  Jerilynn Mages. Daryll Brod, MD Guidance:  Ultrasound fluoroscopic FLUOROSCOPY TIME:  Fluoroscopy Time: 0 minutes 24 seconds (2 mGy). MEDICATIONS: Ancef 2 g administered within 1 hour of procedure ANESTHESIA/SEDATION: Versed 0.5 mg IV; Fentanyl 12.5 mcg IV; Moderate Sedation Time:  18 minutes The patient was continuously monitored during the procedure by the interventional radiology nurse under my direct supervision. CONTRAST:  None. COMPLICATIONS: None immediate. PROCEDURE: Informed consent was obtained from the patient following explanation of the procedure, risks, benefits and alternatives. The patient understands, agrees  and consents for the procedure. All questions were addressed. A time out was performed. Maximal barrier sterile technique utilized including caps, mask, sterile gowns, sterile gloves, large sterile drape, hand hygiene, and 2% chlorhexidine scrub. Under sterile conditions and local anesthesia, right internal jugular micropuncture venous access was performed with ultrasound. Images were obtained for documentation of the patent right internal jugular vein. A guide wire was inserted followed by a transitional dilator. Next, a 0.035 guidewire was advanced into the IVC with a 5-French catheter. Measurements were obtained from the right venotomy site to the proximal right atrium. In the right infraclavicular chest, a subcutaneous tunnel was created under sterile conditions and local anesthesia. 1% lidocaine with epinephrine was utilized for this. The 19 cm tip to cuff dialysis catheter was tunneled subcutaneously to the venotomy site and inserted into the SVC/RA junction through a valved peel-away sheath. Position was confirmed with fluoroscopy. Images were obtained for documentation. Blood was aspirated from the catheter followed by saline and heparin flushes. The appropriate volume and strength of heparin was instilled in each lumen. Caps were applied. The catheter was secured at the tunnel site with Gelfoam and a pursestring suture. The venotomy site was closed with subcuticular Vicryl suture. Dermabond was applied to the small right neck incision. A dry sterile dressing was applied. The catheter is ready for use. No immediate complications. IMPRESSION: Ultrasound and fluoroscopically guided right internal jugular tunneled hemodialysis catheter (19 cm tip to cuff dialysis catheter). Electronically Signed   By: Jerilynn Mages.  Shick M.D.   On: 05/05/2018 15:25   Dg Chest Port 1 View  Result Date: 05/02/2018 CLINICAL DATA:  Pulmonary edema. EXAM: PORTABLE CHEST 1 VIEW COMPARISON:  05/01/2018. FINDINGS: Endotracheal tube and  nasogastric tube have been removed. Right IJ central line tip projects over the SVC. Left IJ central line tip also projects over the SVC. Heart size stable. Mild diffuse interstitial prominence and indistinctness with improvement in aeration in the lung bases. Small bilateral pleural effusions. IMPRESSION: Improving congestive heart failure. Electronically Signed   By: Lorin Picket M.D.   On: 05/02/2018 07:18   Dg Chest Port 1 View  Result Date: 05/01/2018 CLINICAL DATA:  Respiratory failure. EXAM: PORTABLE CHEST 1 VIEW COMPARISON:  04/30/2018. FINDINGS: Endotracheal tube, NG tube, right IJ line stable position. Stable cardiomegaly persistent bibasilar  infiltrates/edema and small bilateral pleural effusions. No interim change. No pneumothorax. IMPRESSION: 1.  Lines and tubes in stable position. 2. Persistent bibasilar infiltrates/edema and small bilateral pleural effusions. No significant interim change. 3.  Stable cardiomegaly. Electronically Signed   By: Marcello Moores  Register   On: 05/01/2018 06:25   Dg Chest Port 1 View  Result Date: 04/30/2018 CLINICAL DATA:  Intubation EXAM: PORTABLE CHEST 1 VIEW COMPARISON:  04/29/2018 FINDINGS: Endotracheal tube in good position. NG tube in the stomach. Central venous catheter tip in the lower SVC unchanged. No pneumothorax Cardiac enlargement. Diffuse bilateral airspace disease has progressed, probable edema. Progression of bibasilar atelectasis and small pleural effusions bilaterally. IMPRESSION: Support lines remain in good position Worsening bilateral airspace disease most consistent with pulmonary edema. Progression of bibasilar atelectasis and bilateral effusions. Electronically Signed   By: Franchot Gallo M.D.   On: 04/30/2018 07:16   Dg Chest Port 1 View  Result Date: 04/29/2018 CLINICAL DATA:  Hypoxia EXAM: PORTABLE CHEST 1 VIEW COMPARISON:  April 28, 2018 FINDINGS: Endotracheal tube tip is 2.1 cm above the carina. Right central catheter tip is in the  superior vena cava. Left central catheter tip is at the junction of the left innominate vein and superior vena cava, stable. Nasogastric tube tip and side port below the diaphragm. No pneumothorax. There is bibasilar atelectasis with small pleural effusion on the right. No consolidation. Heart is mildly enlarged with pulmonary vascularity normal. No adenopathy. No bone lesions. IMPRESSION: Tube and catheter positions as described without pneumothorax. Mild bibasilar atelectasis. Small right pleural effusion. No consolidation. Stable cardiac prominence. Electronically Signed   By: Lowella Grip III M.D.   On: 04/29/2018 07:34   Dg Chest Port 1 View  Result Date: 04/28/2018 CLINICAL DATA:  ETT placement and HD catheter placement EXAM: PORTABLE CHEST 1 VIEW COMPARISON:  04/27/2018. FINDINGS: Cardiomegaly. HD catheter tip distal SVC. ET tube tip 3.3 cm above carina. BILATERAL pulmonary opacities, RIGHT worse than LEFT, consistent with pulmonary edema. Central venous catheter tip unchanged from LEFT IJ approach, proximal SVC. No pneumothorax. IMPRESSION: Cardiomegaly with pulmonary edema may be minimally worse. ET tube and HD catheter appear in satisfactory position. No pneumothorax. Electronically Signed   By: Staci Righter M.D.   On: 04/28/2018 16:57   Dg Chest Port 1 View  Result Date: 04/27/2018 CLINICAL DATA:  Acute respiratory failure EXAM: PORTABLE CHEST 1 VIEW COMPARISON:  04/25/2018 FINDINGS: Bilateral mild interstitial thickening with patchy alveolar airspace opacities at bilateral lung bases. No pleural effusion or pneumothorax. Stable cardiomediastinal silhouette. Left IJ central venous catheter with the tip projecting over the SVC in unchanged position. No acute osseous abnormality. IMPRESSION: 1. Bilateral interstitial thickening concerning for mild interstitial edema versus interstitial infection. Electronically Signed   By: Kathreen Devoid   On: 04/27/2018 08:37   Dg Chest Port 1 View  Result  Date: 04/25/2018 CLINICAL DATA:  Central line placement. EXAM: PORTABLE CHEST 1 VIEW COMPARISON:  Chest radiograph performed 04/24/2018 FINDINGS: The patient's left the IJ line is noted ending about the proximal SVC. Mild vascular congestion is noted. Mild bibasilar atelectasis is seen. No pleural effusion or pneumothorax is seen, though the lung bases are incompletely characterized. The cardiomediastinal silhouette is borderline normal in size. No acute osseous abnormalities are identified. IMPRESSION: 1. Left IJ line noted ending about the proximal SVC. No pneumothorax. 2. Mild vascular congestion noted; mild bibasilar atelectasis seen. Electronically Signed   By: Garald Balding M.D.   On: 04/25/2018 03:57   Dg  Chest Portable 1 View  Result Date: 04/24/2018 CLINICAL DATA:  Initial evaluation for acute sepsis. EXAM: PORTABLE CHEST 1 VIEW COMPARISON:  Prior radiograph from 12/02/2017. FINDINGS: Moderate cardiomegaly, stable. Mediastinal silhouette within normal limits. No focal infiltrates. No pulmonary edema or visible pleural effusion. No pneumothorax. Osseous structures within normal limits. IMPRESSION: 1. No active cardiopulmonary disease. 2. Stable cardiomegaly without pulmonary edema. Electronically Signed   By: Jeannine Boga M.D.   On: 04/24/2018 22:19     CBC Recent Labs  Lab 05/03/18 0427 05/04/18 0445 05/06/18 0345 05/07/18 0418 05/07/18 1012  WBC 17.3* 10.2 14.0* 13.1* 13.4*  HGB 8.2* 7.7* 7.7* 7.0* 6.9*  HCT 27.0* 25.4* 25.8* 23.1* 23.1*  PLT 262 227 265 225 230  MCV 80.6 80.9 82.4 81.6 82.2  MCH 24.5* 24.5* 24.6* 24.7* 24.6*  MCHC 30.4 30.3 29.8* 30.3 29.9*  RDW 18.2* 18.2* 18.7* 18.5* 18.6*  LYMPHSABS 2.1  --   --   --   --   MONOABS 0.9  --   --   --   --   EOSABS 0.6  --   --   --   --   BASOSABS 0.1  --   --   --   --     Chemistries  Recent Labs  Lab 05/03/18 0427 05/04/18 0445 05/05/18 0524 05/06/18 0345 05/07/18 0418  NA 135 137 136 138  137 136  K 3.8  4.6 4.5 4.3  4.3 3.6  CL 100* 100* 100* 102  102 102  CO2 23 25 26 26  25 26   GLUCOSE 99 195* 189* 191*  189* 113*  BUN 31* 36* 44* 52*  53* 32*  CREATININE 5.12* 5.65* 6.09* 6.65*  6.58* 4.69*  CALCIUM 8.0* 8.6* 8.7* 8.4*  8.4* 7.8*  MG  --   --   --   --  1.6*  AST 14*  --   --  11*  --   ALT 7*  --   --  10*  --   ALKPHOS 103  --   --  88  --   BILITOT 0.4  --   --  0.2*  --    ------------------------------------------------------------------------------------------------------------------ No results for input(s): CHOL, HDL, LDLCALC, TRIG, CHOLHDL, LDLDIRECT in the last 72 hours.  Lab Results  Component Value Date   HGBA1C 8.4 (H) 03/24/2018   ------------------------------------------------------------------------------------------------------------------ No results for input(s): TSH, T4TOTAL, T3FREE, THYROIDAB in the last 72 hours.  Invalid input(s): FREET3 ------------------------------------------------------------------------------------------------------------------ Recent Labs    05/06/18 0345 05/06/18 0347 05/06/18 2320  VITAMINB12 462  --  418  FOLATE  --  9.8 6.1  FERRITIN  --   --  234  TIBC  --   --  214*  IRON  --   --  29  RETICCTPCT  --   --  1.8    Coagulation profile Recent Labs  Lab 05/03/18 1418  INR 1.28    No results for input(s): DDIMER in the last 72 hours.  Cardiac Enzymes Recent Labs  Lab 05/02/18 2326 05/03/18 0427 05/03/18 1108  TROPONINI 0.36* 0.35* 0.30*   ------------------------------------------------------------------------------------------------------------------ No results found for: BNP   Sanyia Dini M.D on 05/07/2018 at 4:51 PM  Between 7am to 7pm - Pager - [phone]  After 7pm go to [email or web address] - password [email or web address]  [name] -  Office  [phone]   Voice Recognition Viviann Spare dictation system was used to create this note, attempts have been made to correct errors. Please contact the  Chief Strategy Officer  with questions and/or clarifications.

## 2018-05-07 NOTE — Clinical Social Work Note (Signed)
CSW acknowledges consult "need to go to rehab from here." Please consult PT and OT to evaluate.  Dayton Scrape, Friend

## 2018-05-07 NOTE — Progress Notes (Signed)
ANTICOAGULATION CONSULT NOTE - Follow Up Consult  Pharmacy Consult for Heparin Indication: atrial fibrillation and PAD  Allergies  Allergen Reactions  . Lactose Intolerance (Gi)     Patient Measurements: Height: 5\' 7"  (170.2 cm) Weight: 202 lb 9.6 oz (91.9 kg) IBW/kg (Calculated) : 61.6 Heparin Dosing Weight:    Vital Signs: Temp: 98.4 F (36.9 C) (06/20 0755) Temp Source: Oral (06/20 0755) BP: 131/68 (06/20 0755) Pulse Rate: 90 (06/20 0755)  Labs: Recent Labs    05/05/18 0524 05/06/18 0041 05/06/18 0345 05/07/18 0418  HGB  --   --  7.7* 7.0*  HCT  --   --  25.8* 23.1*  PLT  --   --  265 225  HEPARINUNFRC 0.67 0.48  --  0.45  CREATININE 6.09*  --  6.65*  6.58* 4.69*    Estimated Creatinine Clearance: 16 mL/min (A) (by C-G formula based on SCr of 4.69 mg/dL (H)).  Assessment:  Anticoag: Heparin gtt for AFib + NSTEMI - HL 0.45 in goal, Hgb 8.2>7.7>7, plts WNL, no bleeding noted -Xarelto pta, last dose 6/6  Goal of Therapy:  Heparin level 0.3-0.7 units/ml Monitor platelets by anticoagulation protocol: Yes   Plan:  Continue IV heparin at 1550 units/hr.   Daily heparin level and CBC Cath once renal fx improves  Resume home pregabalin?  Kelbie Moro S. Alford Highland, PharmD, BCPS Clinical Staff Pharmacist Pager [phone]  Eilene Ghazi Stillinger 05/07/2018,8:38 AM

## 2018-05-07 NOTE — Progress Notes (Addendum)
Progress Note  Patient Name: [name] Date of Encounter: 05/07/2018  Primary Cardiologist: Nelva Bush, MD  Subjective   Denies any CP or SOB. Has concerns about amount of pillows allowed in the bed. I/O's incomplete overnight but nurse got report hardly any in the foley, and there is essentially no urine in there now. She does have a rectal tube in place which appears to have very dark black/green stool.  Inpatient Medications    Scheduled Meds: . aspirin  81 mg Oral Daily  . chlorhexidine  15 mL Mouth Rinse BID  . Chlorhexidine Gluconate Cloth  6 each Topical Q0600  . docusate sodium  100 mg Oral BID  . ferrous sulfate  325 mg Oral Daily  . insulin aspart  0-5 Units Subcutaneous QHS  . insulin aspart  0-9 Units Subcutaneous TID WC  . mouth rinse  15 mL Mouth Rinse q12n4p  . multivitamin  1 tablet Oral QHS  . pantoprazole  40 mg Oral Q1200  . rosuvastatin  20 mg Oral q1800  . sodium chloride flush  10-40 mL Intracatheter Q12H   Continuous Infusions: . sodium chloride    . sodium chloride    . sodium chloride 10 mL/hr at 05/06/18 1800  . heparin 1,550 Units/hr (05/06/18 2336)   PRN Meds: sodium chloride, sodium chloride, acetaminophen, alteplase, fentaNYL (SUBLIMAZE) injection, heparin, nitroGLYCERIN, ondansetron (ZOFRAN) IV, oxyCODONE, phenol, sodium chloride flush   Vital Signs    Vitals:   05/07/18 0007 05/07/18 0431 05/07/18 0755 05/07/18 0918  BP: (!) 85/62 (!) 84/64 131/68 117/64  Pulse: 84 85 90 89  Resp: 16 18 18    Temp: 98.1 F (36.7 C) 97.7 F (36.5 C) 98.4 F (36.9 C)   TempSrc: Oral Oral Oral   SpO2: 100% 100% 99% 100%  Weight:  202 lb 9.6 oz (91.9 kg)    Height:        Intake/Output Summary (Last 24 hours) at 05/07/2018 0932 Last data filed at 05/07/2018 0300 Gross per 24 hour  Intake 859.12 ml  Output -  Net 859.12 ml   Filed Weights   05/06/18 0337 05/06/18 0800 05/07/18 0431  Weight: 209 lb 10.5 oz (95.1 kg) 205 lb 4 oz (93.1  kg) 202 lb 9.6 oz (91.9 kg)    Telemetry    NSR - Personally Reviewed  Physical Exam   GEN: No acute distress.  HEENT: Normocephalic, atraumatic, sclera non-icteric. Strabismus (was present yesterday) Neck: No JVD or bruits. Cardiac: RRR no murmurs, rubs, or gallops.  Radials/DP/PT 1+ and equal bilaterally.  Respiratory: Clear to auscultation bilaterally. Breathing is unlabored. GI: Soft, nontender, non-distended, BS +x 4. MS: s/p LLE amputation Extremities: No clubbing or cyanosis. 1+ pale pitting RLL edema. Distal pedal pulses are 2+ and equal bilaterally. S/p LLE amputation Neuro:  AAOx3. Follows commands. Psych:  Responds to questions appropriately with a normal affect.  Labs    Chemistry Recent Labs  Lab 05/03/18 0427  05/05/18 0524 05/06/18 0345 05/07/18 0418  NA 135   < > 136 138  137 136  K 3.8   < > 4.5 4.3  4.3 3.6  CL 100*   < > 100* 102  102 102  CO2 23   < > 26 26  25 26   GLUCOSE 99   < > 189* 191*  189* 113*  BUN 31*   < > 44* 52*  53* 32*  CREATININE 5.12*   < > 6.09* 6.65*  6.58* 4.69*  CALCIUM  8.0*   < > 8.7* 8.4*  8.4* 7.8*  PROT 5.4*  --   --  5.4*  --   ALBUMIN 1.5*  --   --  1.9*  1.9*  --   AST 14*  --   --  11*  --   ALT 7*  --   --  10*  --   ALKPHOS 103  --   --  88  --   BILITOT 0.4  --   --  0.2*  --   GFRNONAA 9*   < > 7* 6*  6* 10*  GFRAA 10*   < > 8* 7*  7* 11*  ANIONGAP 12   < > 10 10  10 8    < > = values in this interval not displayed.     Hematology Recent Labs  Lab 05/04/18 0445 05/06/18 0345 05/06/18 2320 05/07/18 0418  WBC 10.2 14.0*  --  13.1*  RBC 3.14* 3.13* 2.81* 2.83*  HGB 7.7* 7.7*  --  7.0*  HCT 25.4* 25.8*  --  23.1*  MCV 80.9 82.4  --  81.6  MCH 24.5* 24.6*  --  24.7*  MCHC 30.3 29.8*  --  30.3  RDW 18.2* 18.7*  --  18.5*  PLT 227 265  --  225    Cardiac Enzymes Recent Labs  Lab 04/30/18 2045 05/02/18 2326 05/03/18 0427 05/03/18 1108  TROPONINI 0.68* 0.36* 0.35* 0.30*   No results for  input(s): TROPIPOC in the last 168 hours.    Radiology    Ir Fluoro Guide Cv Line Right  Result Date: 05/05/2018 INDICATION: Acute kidney injury, no current access for dialysis EXAM: ULTRASOUND GUIDANCE FOR VASCULAR ACCESS RIGHT INTERNAL JUGULAR PERMANENT HEMODIALYSIS CATHETER Date:  05/05/2018 05/05/2018 3:15 pm Radiologist:  Jerilynn Mages. Daryll Brod, MD Guidance:  Ultrasound fluoroscopic FLUOROSCOPY TIME:  Fluoroscopy Time: 0 minutes 24 seconds (2 mGy). MEDICATIONS: Ancef 2 g administered within 1 hour of procedure ANESTHESIA/SEDATION: Versed 0.5 mg IV; Fentanyl 12.5 mcg IV; Moderate Sedation Time:  18 minutes The patient was continuously monitored during the procedure by the interventional radiology nurse under my direct supervision. CONTRAST:  None. COMPLICATIONS: None immediate. PROCEDURE: Informed consent was obtained from the patient following explanation of the procedure, risks, benefits and alternatives. The patient understands, agrees and consents for the procedure. All questions were addressed. A time out was performed. Maximal barrier sterile technique utilized including caps, mask, sterile gowns, sterile gloves, large sterile drape, hand hygiene, and 2% chlorhexidine scrub. Under sterile conditions and local anesthesia, right internal jugular micropuncture venous access was performed with ultrasound. Images were obtained for documentation of the patent right internal jugular vein. A guide wire was inserted followed by a transitional dilator. Next, a 0.035 guidewire was advanced into the IVC with a 5-French catheter. Measurements were obtained from the right venotomy site to the proximal right atrium. In the right infraclavicular chest, a subcutaneous tunnel was created under sterile conditions and local anesthesia. 1% lidocaine with epinephrine was utilized for this. The 19 cm tip to cuff dialysis catheter was tunneled subcutaneously to the venotomy site and inserted into the SVC/RA junction through a  valved peel-away sheath. Position was confirmed with fluoroscopy. Images were obtained for documentation. Blood was aspirated from the catheter followed by saline and heparin flushes. The appropriate volume and strength of heparin was instilled in each lumen. Caps were applied. The catheter was secured at the tunnel site with Gelfoam and a pursestring suture. The venotomy site was closed  with subcuticular Vicryl suture. Dermabond was applied to the small right neck incision. A dry sterile dressing was applied. The catheter is ready for use. No immediate complications. IMPRESSION: Ultrasound and fluoroscopically guided right internal jugular tunneled hemodialysis catheter (19 cm tip to cuff dialysis catheter). Electronically Signed   By: Jerilynn Mages.  Shick M.D.   On: 05/05/2018 15:25   Ir US Guide Vasc Access Right  Result Date: 05/05/2018 INDICATION: Acute kidney injury, no current access for dialysis EXAM: ULTRASOUND GUIDANCE FOR VASCULAR ACCESS RIGHT INTERNAL JUGULAR PERMANENT HEMODIALYSIS CATHETER Date:  05/05/2018 05/05/2018 3:15 pm Radiologist:  Jerilynn Mages. Daryll Brod, MD Guidance:  Ultrasound fluoroscopic FLUOROSCOPY TIME:  Fluoroscopy Time: 0 minutes 24 seconds (2 mGy). MEDICATIONS: Ancef 2 g administered within 1 hour of procedure ANESTHESIA/SEDATION: Versed 0.5 mg IV; Fentanyl 12.5 mcg IV; Moderate Sedation Time:  18 minutes The patient was continuously monitored during the procedure by the interventional radiology nurse under my direct supervision. CONTRAST:  None. COMPLICATIONS: None immediate. PROCEDURE: Informed consent was obtained from the patient following explanation of the procedure, risks, benefits and alternatives. The patient understands, agrees and consents for the procedure. All questions were addressed. A time out was performed. Maximal barrier sterile technique utilized including caps, mask, sterile gowns, sterile gloves, large sterile drape, hand hygiene, and 2% chlorhexidine scrub. Under sterile  conditions and local anesthesia, right internal jugular micropuncture venous access was performed with ultrasound. Images were obtained for documentation of the patent right internal jugular vein. A guide wire was inserted followed by a transitional dilator. Next, a 0.035 guidewire was advanced into the IVC with a 5-French catheter. Measurements were obtained from the right venotomy site to the proximal right atrium. In the right infraclavicular chest, a subcutaneous tunnel was created under sterile conditions and local anesthesia. 1% lidocaine with epinephrine was utilized for this. The 19 cm tip to cuff dialysis catheter was tunneled subcutaneously to the venotomy site and inserted into the SVC/RA junction through a valved peel-away sheath. Position was confirmed with fluoroscopy. Images were obtained for documentation. Blood was aspirated from the catheter followed by saline and heparin flushes. The appropriate volume and strength of heparin was instilled in each lumen. Caps were applied. The catheter was secured at the tunnel site with Gelfoam and a pursestring suture. The venotomy site was closed with subcuticular Vicryl suture. Dermabond was applied to the small right neck incision. A dry sterile dressing was applied. The catheter is ready for use. No immediate complications. IMPRESSION: Ultrasound and fluoroscopically guided right internal jugular tunneled hemodialysis catheter (19 cm tip to cuff dialysis catheter). Electronically Signed   By: Jerilynn Mages.  Shick M.D.   On: 05/05/2018 15:25    Cardiac Studies   2D echo 04/28/18 Study Conclusions - Left ventricle: The cavity size was normal. There was mild concentric hypertrophy. Systolic function was moderately to severely reduced. The estimated ejection fraction was in the range of 30% to 35%. Features are consistent with a pseudonormal left ventricular filling pattern, with concomitant abnormal relaxation and increased filling pressure (grade 2  diastolic dysfunction). Doppler parameters are consistent with elevated ventricular end-diastolic filling pressure. - Mitral valve: Calcified annulus. Moderately thickened, moderately calcified leaflets . There was mild regurgitation. - Left atrium: The atrium was mildly dilated. - Right ventricle: The cavity size was moderately dilated. Wall thickness was normal. Systolic function was moderately reduced. - Tricuspid valve: There was mild regurgitation. - Pulmonary arteries: Systolic pressure was mildly increased. PA peak pressure: 34 mm Hg (S). - Pericardium, extracardiac: A trivial  pericardial effusion was identified. Features were not consistent with tamponade physiology Impressions: - When compared to the prior study from 04/17/2017 LVEF has decreased from 55-60% to 30-35% with new wall motion abnormalities - diffuse hypokinesis and akinesis of the basal and mid anteroseptal, anterior and apical septal and anterior walls. RVEF is moderately decreased.    Patient Profile     52 y.o. 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 H/o chronic wounds of left foot with prior ABF and Fem-Pop Bypass,recently admitted from May 10-14 for redo of left femoral to PTA bypass with cryopreserved vein. Was seen in vascular office 6/3 and was febrile/hypotensive -> advised to go to ER as she was felt to require limb amputation but patient declined, citing she wanted to go on pre-planned camping trip. Was admitted with septic shock due to LLE gangrene with multiorgan dysfunction - hypotension requiring pressors, renal failure requiring initiation of dialysis, elevated troponin of 7.7, and new LV dysfunction with drop in EF to 30-35% with new WMA. 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 Also found to have recurrent paroxysmal atrial fib  during admission.  Assessment & Plan    1. Septic shock with multiple organ involvement as above, acute renal failure requiring HD initiation - being managed by primary team. Neo weaned but hypotensive overnight briefly with recovery. Remains on HD per renal. Cr is noted to be decreasing; UOP not yet recorded for past 24 hr but reported to be very little.  2. New cardiomyopathy - BP prohibits med titration at present time, will hold off beta blocker for now. No ACEI/ARB/spiro at present time given renal failure. See below re: cath. Overall appears euvolemic although weights have trended upwards this admission. They do appear to be steadily declining (173 on adm, peak 209, now 202). Volume currently begin managed with HD.  3. NSTEMI - suspected demand ischemia due to septic shock with high level of suspicion for underlying CAD. Plan cardiac cath for evaluation of coronaries once renal function improves or she is clearly ESRD. Also need to ensure stability of anemia. Continue current regimen but see below regarding anemia. Would not add Plavix given concomitant need for [facility] for afib as well.  5. Normocytic anemia - Hgb has been variable this admission but currently on the decline. Spoke with nurse and asked her to hemoccult rectal tube stool as it appears quite dark - asked her to communicate with IM regarding this issue as well. Will await IM input after results come back to determine if OK to continue heparin/ASA. May need to consider transfusion given her NSTEMI this admission (will review with cardiologist).  6. Paroxysmal atrial fib - prior h/o such, maintaining NSR. Maintained on heparin for now until plan for #3 is elucidated (but see above re: anemia).  Addendum: hemoccult was positive, next Hgb 6.9. Spoke with nurse and asked her to make sure IM is aware of this. Per d/w Dr. Sallyanne Kuster, OK to stop heparin given it's been >48 hours since NSTEMI and she is in NSR. Await input from IM about  whether OK to continue ASA given + FOBT. Will need reassessment when more stable about DVT ppx, at discretion of IM.  For questions or updates, please contact [place] Please consult [email or web address] for contact info under Cardiology/STEMI.  Signed, Charlie Pitter, PA-C 05/07/2018, 9:32 AM    I have  seen and examined the patient along with Charlie Pitter, PA-C.  I have reviewed the chart, notes and new data.  I agree with PA/NP's note.  Key new complaints: Denies angina and dyspnea.  Has a variety of other somatic complaints, none of them cardiac Key examination changes: Pale, no distress while on dialysis.  Hemodynamically compensated. Key new findings / data: Oligoanuric, evidence of GI bleeding, decreased hemoglobin.  No recurrence of atrial fibrillation telemetry  PLAN: Stop heparin.  I think her infarction was clearly due to demand ischemia, not in acute ulcerated plaque.  As far as acute coronary syndrome, beyond 72 hours no benefit of heparin anyway.  Anticoagulation is also indicated for atrial fibrillation, but none recorded now in several days.  Risk of worsening GI bleeding exceeds the benefit.  For the same reason, we will not add clopidogrel. Not a good candidate for invasive coronary evaluation due to possible active GI bleeding, not to mention acute renal failure. She is clearly "ahead" volume wise, but does not appear to be in left heart failure.  Will defer volume management to nephrology.  Sanda Klein, MD, [place] [phone] 05/07/2018, 3:30 PM

## 2018-05-07 NOTE — Plan of Care (Signed)
  Problem: Clinical Measurements: Goal: Ability to maintain clinical measurements within normal limits will improve Outcome: Progressing   Problem: Clinical Measurements: Goal: Cardiovascular complication will be avoided Outcome: Progressing   Problem: Coping: Goal: Level of anxiety will decrease Outcome: Progressing   Problem: Fluid Volume: Goal: Hemodynamic stability will improve Outcome: Progressing

## 2018-05-07 NOTE — Progress Notes (Signed)
Patient has had foley in since 6/8.  Paged doctor to get order to remove catheter.

## 2018-05-07 NOTE — Procedures (Signed)
Patient seen and examined on Hemodialysis. QB 400 mL min via TDC UF goal 2L  Tolerating rx well.  Treatment adjusted as needed.  Madelon Lips MD 11:50 AM

## 2018-05-08 LAB — TYPE AND SCREEN
ABO/RH(D): O NEG
ANTIBODY SCREEN: NEGATIVE
Unit division: 0

## 2018-05-08 LAB — GLUCOSE, CAPILLARY
GLUCOSE-CAPILLARY: 109 mg/dL — AB (ref 65–99)
Glucose-Capillary: 123 mg/dL — ABNORMAL HIGH (ref 65–99)
Glucose-Capillary: 116 mg/dL — ABNORMAL HIGH (ref 65–99)
Glucose-Capillary: 135 mg/dL — ABNORMAL HIGH (ref 65–99)

## 2018-05-08 LAB — BASIC METABOLIC PANEL
ANION GAP: 7 (ref 5–15)
BUN: 16 mg/dL (ref 6–20)
CALCIUM: 8.2 mg/dL — AB (ref 8.9–10.3)
CHLORIDE: 104 mmol/L (ref 101–111)
CO2: 27 mmol/L (ref 22–32)
Creatinine, Ser: 3.23 mg/dL — ABNORMAL HIGH (ref 0.44–1.00)
GFR calc Af Amer: 18 mL/min — ABNORMAL LOW (ref >60)
GFR calc non Af Amer: 15 mL/min — ABNORMAL LOW (ref >60)
Glucose, Bld: 126 mg/dL — ABNORMAL HIGH (ref 65–99)
POTASSIUM: 3.8 mmol/L (ref 3.5–5.1)
Sodium: 138 mmol/L (ref 135–145)

## 2018-05-08 LAB — CBC
HEMATOCRIT: 26.5 % — AB (ref 36.0–46.0)
HEMOGLOBIN: 7.9 g/dL — AB (ref 12.0–15.0)
MCH: 24.8 pg — ABNORMAL LOW (ref 26.0–34.0)
MCHC: 29.8 g/dL — ABNORMAL LOW (ref 30.0–36.0)
MCV: 83.1 fL (ref 78.0–100.0)
Platelets: 176 10*3/uL (ref 150–400)
RBC: 3.19 MIL/uL — AB (ref 3.87–5.11)
RDW: 18.1 % — ABNORMAL HIGH (ref 11.5–15.5)
WBC: 13.2 10*3/uL — AB (ref 4.0–10.5)

## 2018-05-08 LAB — MAGNESIUM: Magnesium: 1.8 mg/dL (ref 1.7–2.4)

## 2018-05-08 LAB — BPAM RBC
Blood Product Expiration Date: 201907052359
ISSUE DATE / TIME: 201906202017
Unit Type and Rh: 9500

## 2018-05-08 MED ORDER — METOPROLOL TARTRATE 5 MG/5ML IV SOLN: 5.0000 mg | INTRAVENOUS | Status: DC | PRN

## 2018-05-08 MED ORDER — PRO-STAT SUGAR FREE PO LIQD
30.0000 mL | Freq: Three times a day (TID) | ORAL | Status: DC
  Administered 2018-05-08 – 2018-05-14 (×4): 30 mL via ORAL
  Filled 2018-05-08 (×13): qty 30

## 2018-05-08 MED ORDER — AMIODARONE HCL 200 MG PO TABS
400.0000 mg | ORAL_TABLET | Freq: Every day | ORAL | Status: DC
  Administered 2018-05-08 – 2018-05-22 (×15): 400 mg via ORAL
  Filled 2018-05-08 (×15): qty 2

## 2018-05-08 NOTE — Progress Notes (Signed)
Flexia-seal got delivered on the floor around this time, patient is not having any loose stool since this morning when she pulled out her flexiseal. Secretary is sending it back for now. Will continue to monitor and see if the patient needs it later on.

## 2018-05-08 NOTE — Care Management Important Message (Signed)
Important Message  Patient Details  Name: [name] MRN: [id number] Date of Birth: December 13, 1965   Medicare Important Message Given:  Yes    [name] P [place] 05/08/2018, 2:16 PM

## 2018-05-08 NOTE — Progress Notes (Signed)
Patient Demographics:    [name], is a 52 y.o. female, DOB - 09/18/1966, BPZ:[id number]  Admit date - 04/24/2018   Admitting Physician [name], MD  Outpatient Primary MD for the patient is [name], MD  LOS - 58   Chief Complaint  Patient presents with  . Leg Pain        Subjective:    [name] today has no fevers, no emesis,  No chest pain, was confused overnight pulled out and left IJ catheter, also pulled out a rectal tube this a.m. 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)   PAD (peripheral artery disease) (HCC)   Pressure injury of skin   DM2 (diabetes mellitus, type 2) (HCC)   Acute kidney injury ([name])   Buttock wound, left, subsequent encounter   Paroxysmal atrial fibrillation with rapid ventricular response (HCC)   Lower limb ischemia   Microcytic anemia   Gangrene of lower extremity (HCC)   Acute renal failure (HCC)   Acute respiratory failure (HCC)   Sepsis (HCC)   Acute encephalopathy   Acute combined systolic and diastolic heart failure (HCC)   Non-ST elevation (NSTEMI) myocardial infarction [facility])   Brief Narrative:  52 y/o WF  PMHx  Depression, Diabetes mellitus type II uncontrolled with complication, Diabetic neuropathy ,  Femoral-popliteal bypass graft occlusion, 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).She also had a NSTEMI with new cardiomyopathy.   Plan:- 1)Acute on chronic Anemia--hemoglobin up to 7.9 from 6.9 after transfusion of 1 unit of packed cells on 05/07/2018,,  Hemoccult positive, heparin  drip has been discontinued by cardiology team, given CAD try to keep hemoglobin at least close to 9, received Aranesp x 1 on 05/07/18   2)NSTEMI--suspect due to demand ischemia in the setting of septic shock, clinically high index of suspicion for underlying CAD-cardiology input appreciated, IV heparin drip discontinued after 48 hours per cardiology team, cardiology team will consider low-dose beta-blocker if blood pressure allows, at this time patient unable to tolerate ACE/ARB/Aldactone due to renal concerns, c/n aspirin and crestor  3)AKI/ATN-renal failure in the setting of sepsis with multiorgan failure and persistent hypotension requiring pressors, no significant renal recovery, limited  urine output, hemodialysis was started on 04/29/2018, last hemodialysis session on 05/07/2018,  4)PAFib--- this is not new, unable to anticoagulate due to #1 above, amiodarone as ordered by cardiology team  5)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,  6)HFrEF--patient with combined systolic and diastolic dysfunction CHF, echo with EF of 35% with grade 2 diastolic dysfunction, at this time patient unable to tolerate ACE/ARB/Aldactone due to renal concerns, continue to use hemodialysis to address volume status  7) developmental delay/cognitive deficits--- baseline patient has cognitive deficits which have worsened since acute infection  8)DM2-poor control, A1c 8.4, Use Novolog/Humalog Sliding scale insulin with Accu-Cheks/Fingersticks as ordered   Code Status : Full  Disposition Plan  : TBD, need to resolve hemodialysis  issues first  Consults  :  PCCM/cardiology  DVT Prophylaxis  :   Heparin Hodgkins  Lab Results  Component Value Date   PLT 176 05/08/2018    Inpatient Medications  Scheduled Meds: . sodium chloride   Intravenous Once  . amiodarone  400 mg Oral Daily  . aspirin  81 mg Oral Daily  . chlorhexidine  15 mL Mouth Rinse BID  . Chlorhexidine Gluconate Cloth  6 each Topical Q0600  .  darbepoetin (ARANESP) injection - DIALYSIS  100 mcg Intravenous Q Thu-HD  . docusate sodium  100 mg Oral BID  . feeding supplement (PRO-STAT SUGAR FREE 64)  30 mL Oral TID BM  . ferrous sulfate  325 mg Oral BID WC  . heparin injection (subcutaneous)  5,000 Units Subcutaneous Q8H  . insulin aspart  0-5 Units Subcutaneous QHS  . insulin aspart  0-9 Units Subcutaneous TID WC  . mouth rinse  15 mL Mouth Rinse q12n4p  . multivitamin  1 tablet Oral QHS  . pantoprazole  40 mg Oral BID AC  . rosuvastatin  20 mg Oral q1800  . sodium chloride flush  10-40 mL Intracatheter Q12H   Continuous Infusions: . sodium chloride 10 mL/hr at 05/08/18 0027   PRN Meds:.acetaminophen, alteplase, fentaNYL (SUBLIMAZE) injection, heparin, metoprolol tartrate, nitroGLYCERIN, ondansetron (ZOFRAN) IV, oxyCODONE, phenol, sodium chloride flush    Anti-infectives (From admission, onward)   Start     Dose/Rate Route Frequency Ordered Stop   05/05/18 1354  ceFAZolin (ANCEF) 2-4 GM/100ML-% IVPB  Status:  Discontinued    Note to Pharmacy:  Gar Ponto   : cabinet override      05/05/18 1354 05/05/18 1451   05/05/18 0930  ceFAZolin (ANCEF) IVPB 2g/100 mL premix     2 g 200 mL/hr over 30 Minutes Intravenous On call 05/05/18 0924 05/06/18 0930   04/30/18 0900  vancomycin (VANCOCIN) IVPB 750 mg/150 ml premix  Status:  Discontinued     750 mg 150 mL/hr over 60 Minutes Intravenous  Once 04/30/18 0819 04/30/18 0828   04/28/18 1400  cefTAZidime (FORTAZ) 1 g in sodium chloride 0.9 % 100 mL IVPB     1 g 200 mL/hr over 30 Minutes Intravenous Every 24 hours 04/28/18 1301 05/01/18 1416   04/26/18 0600  vancomycin (VANCOCIN) IVPB 1000 mg/200 mL premix  Status:  Discontinued     1,000 mg 200 mL/hr over 60 Minutes Intravenous Every 48 hours 04/26/18 0515 04/28/18 1152   04/25/18 1000  vancomycin (VANCOCIN) IVPB 1000 mg/200 mL premix  Status:  Discontinued     1,000 mg 200 mL/hr over 60 Minutes Intravenous Every 12 hours  04/24/18 2213 04/25/18 0215   04/25/18 0600  piperacillin-tazobactam (ZOSYN) IVPB 3.375 g  Status:  Discontinued     3.375 g 12.5 mL/hr over 240 Minutes Intravenous Every 8 hours 04/24/18 2213 04/25/18 0220   04/25/18 0600  piperacillin-tazobactam (ZOSYN) IVPB 2.25 g  Status:  Discontinued     2.25 g 100 mL/hr over 30 Minutes Intravenous Every 8 hours 04/25/18 0220 04/28/18 1152   04/24/18 2100  piperacillin-tazobactam (ZOSYN) IVPB 3.375 g     3.375 g 100 mL/hr over 30 Minutes Intravenous  Once 04/24/18 2050 04/24/18 2242   04/24/18 2100  vancomycin (VANCOCIN) IVPB 1000 mg/200 mL premix     1,000 mg 200 mL/hr over 60 Minutes Intravenous  Once 04/24/18 2050 04/24/18 2352        Objective:   Vitals:   05/08/18 0156 05/08/18 0348 05/08/18  0417 05/08/18 1128  BP: 97/62  92/64 140/82  Pulse: 96  (!) 101 92  Resp:   18 20  Temp:   98 F (36.7 C) 97.9 F (36.6 C)  TempSrc:   Oral Oral  SpO2:   96% 100%  Weight:  91.8 kg (202 lb 6.1 oz)    Height:        Wt Readings from Last 3 Encounters:  05/08/18 91.8 kg (202 lb 6.1 oz)  04/20/18 72.6 kg (160 lb)  03/31/18 79.4 kg (175 lb)     Intake/Output Summary (Last 24 hours) at 05/08/2018 1853 Last data filed at 05/08/2018 1700 Gross per 24 hour  Intake 1790 ml  Output -  Net 1790 ml     Physical Exam  Gen:- Awake Alert,  In no apparent distress  HEENT:- [place].AT, No sclera icterus Neck-Supple Neck,No JVD,  Rt IJ HD catheter.  Lungs-  CTAB , good air movement  CV- S1, S2 normal Abd-  +ve B.Sounds, Abd Soft, No tenderness,    Extremity/Skin:- LT AKA intact suture/staple line Psych-affect is appropriate, oriented x3, poor judgment and cognitive concerns persist Neuro-no new focal deficits, no tremors   Data Review:   Micro Results No results found for this or any previous visit (from the past 240 hour(s)).  Radiology Reports US Renal  Result Date: 04/25/2018 CLINICAL DATA:  Initial evaluation for acute renal injury. EXAM:  RENAL / URINARY TRACT ULTRASOUND COMPLETE COMPARISON:  Prior CT from 11/30/2017 FINDINGS: Right Kidney: Length: 14.4 cm. Echogenicity within normal limits. No mass or hydronephrosis visualized. Left Kidney: Length: 13.7 cm. Echogenicity within normal limits. No mass or hydronephrosis visualized. Bladder: Bladder decompressed with a Foley catheter in place. IMPRESSION: 1. Negative renal ultrasound.  No hydronephrosis. 2. Bladder decompressed with a Foley catheter in place. Electronically Signed   By: Jeannine Boga M.D.   On: 04/25/2018 06:55   Ir Fluoro Guide Cv Line Right  Result Date: 05/05/2018 INDICATION: Acute kidney injury, no current access for dialysis EXAM: ULTRASOUND GUIDANCE FOR VASCULAR ACCESS RIGHT INTERNAL JUGULAR PERMANENT HEMODIALYSIS CATHETER Date:  05/05/2018 05/05/2018 3:15 pm Radiologist:  Jerilynn Mages. Daryll Brod, MD Guidance:  Ultrasound fluoroscopic FLUOROSCOPY TIME:  Fluoroscopy Time: 0 minutes 24 seconds (2 mGy). MEDICATIONS: Ancef 2 g administered within 1 hour of procedure ANESTHESIA/SEDATION: Versed 0.5 mg IV; Fentanyl 12.5 mcg IV; Moderate Sedation Time:  18 minutes The patient was continuously monitored during the procedure by the interventional radiology nurse under my direct supervision. CONTRAST:  None. COMPLICATIONS: None immediate. PROCEDURE: Informed consent was obtained from the patient following explanation of the procedure, risks, benefits and alternatives. The patient understands, agrees and consents for the procedure. All questions were addressed. A time out was performed. Maximal barrier sterile technique utilized including caps, mask, sterile gowns, sterile gloves, large sterile drape, hand hygiene, and 2% chlorhexidine scrub. Under sterile conditions and local anesthesia, right internal jugular micropuncture venous access was performed with ultrasound. Images were obtained for documentation of the patent right internal jugular vein. A guide wire was inserted followed by a  transitional dilator. Next, a 0.035 guidewire was advanced into the IVC with a 5-French catheter. Measurements were obtained from the right venotomy site to the proximal right atrium. In the right infraclavicular chest, a subcutaneous tunnel was created under sterile conditions and local anesthesia. 1% lidocaine with epinephrine was utilized for this. The 19 cm tip to cuff dialysis catheter was tunneled subcutaneously to the venotomy site and inserted into the SVC/RA junction through a  valved peel-away sheath. Position was confirmed with fluoroscopy. Images were obtained for documentation. Blood was aspirated from the catheter followed by saline and heparin flushes. The appropriate volume and strength of heparin was instilled in each lumen. Caps were applied. The catheter was secured at the tunnel site with Gelfoam and a pursestring suture. The venotomy site was closed with subcuticular Vicryl suture. Dermabond was applied to the small right neck incision. A dry sterile dressing was applied. The catheter is ready for use. No immediate complications. IMPRESSION: Ultrasound and fluoroscopically guided right internal jugular tunneled hemodialysis catheter (19 cm tip to cuff dialysis catheter). Electronically Signed   By: Jerilynn Mages.  Shick M.D.   On: 05/05/2018 15:25   Ir US Guide Vasc Access Right  Result Date: 05/05/2018 INDICATION: Acute kidney injury, no current access for dialysis EXAM: ULTRASOUND GUIDANCE FOR VASCULAR ACCESS RIGHT INTERNAL JUGULAR PERMANENT HEMODIALYSIS CATHETER Date:  05/05/2018 05/05/2018 3:15 pm Radiologist:  Jerilynn Mages. Daryll Brod, MD Guidance:  Ultrasound fluoroscopic FLUOROSCOPY TIME:  Fluoroscopy Time: 0 minutes 24 seconds (2 mGy). MEDICATIONS: Ancef 2 g administered within 1 hour of procedure ANESTHESIA/SEDATION: Versed 0.5 mg IV; Fentanyl 12.5 mcg IV; Moderate Sedation Time:  18 minutes The patient was continuously monitored during the procedure by the interventional radiology nurse under my direct  supervision. CONTRAST:  None. COMPLICATIONS: None immediate. PROCEDURE: Informed consent was obtained from the patient following explanation of the procedure, risks, benefits and alternatives. The patient understands, agrees and consents for the procedure. All questions were addressed. A time out was performed. Maximal barrier sterile technique utilized including caps, mask, sterile gowns, sterile gloves, large sterile drape, hand hygiene, and 2% chlorhexidine scrub. Under sterile conditions and local anesthesia, right internal jugular micropuncture venous access was performed with ultrasound. Images were obtained for documentation of the patent right internal jugular vein. A guide wire was inserted followed by a transitional dilator. Next, a 0.035 guidewire was advanced into the IVC with a 5-French catheter. Measurements were obtained from the right venotomy site to the proximal right atrium. In the right infraclavicular chest, a subcutaneous tunnel was created under sterile conditions and local anesthesia. 1% lidocaine with epinephrine was utilized for this. The 19 cm tip to cuff dialysis catheter was tunneled subcutaneously to the venotomy site and inserted into the SVC/RA junction through a valved peel-away sheath. Position was confirmed with fluoroscopy. Images were obtained for documentation. Blood was aspirated from the catheter followed by saline and heparin flushes. The appropriate volume and strength of heparin was instilled in each lumen. Caps were applied. The catheter was secured at the tunnel site with Gelfoam and a pursestring suture. The venotomy site was closed with subcuticular Vicryl suture. Dermabond was applied to the small right neck incision. A dry sterile dressing was applied. The catheter is ready for use. No immediate complications. IMPRESSION: Ultrasound and fluoroscopically guided right internal jugular tunneled hemodialysis catheter (19 cm tip to cuff dialysis catheter). Electronically  Signed   By: Jerilynn Mages.  Shick M.D.   On: 05/05/2018 15:25   Dg Chest Port 1 View  Result Date: 05/02/2018 CLINICAL DATA:  Pulmonary edema. EXAM: PORTABLE CHEST 1 VIEW COMPARISON:  05/01/2018. FINDINGS: Endotracheal tube and nasogastric tube have been removed. Right IJ central line tip projects over the SVC. Left IJ central line tip also projects over the SVC. Heart size stable. Mild diffuse interstitial prominence and indistinctness with improvement in aeration in the lung bases. Small bilateral pleural effusions. IMPRESSION: Improving congestive heart failure. Electronically Signed   By: Rip Harbour  Blietz M.D.   On: 05/02/2018 07:18   Dg Chest Port 1 View  Result Date: 05/01/2018 CLINICAL DATA:  Respiratory failure. EXAM: PORTABLE CHEST 1 VIEW COMPARISON:  04/30/2018. FINDINGS: Endotracheal tube, NG tube, right IJ line stable position. Stable cardiomegaly persistent bibasilar infiltrates/edema and small bilateral pleural effusions. No interim change. No pneumothorax. IMPRESSION: 1.  Lines and tubes in stable position. 2. Persistent bibasilar infiltrates/edema and small bilateral pleural effusions. No significant interim change. 3.  Stable cardiomegaly. Electronically Signed   By: Marcello Moores  Register   On: 05/01/2018 06:25   Dg Chest Port 1 View  Result Date: 04/30/2018 CLINICAL DATA:  Intubation EXAM: PORTABLE CHEST 1 VIEW COMPARISON:  04/29/2018 FINDINGS: Endotracheal tube in good position. NG tube in the stomach. Central venous catheter tip in the lower SVC unchanged. No pneumothorax Cardiac enlargement. Diffuse bilateral airspace disease has progressed, probable edema. Progression of bibasilar atelectasis and small pleural effusions bilaterally. IMPRESSION: Support lines remain in good position Worsening bilateral airspace disease most consistent with pulmonary edema. Progression of bibasilar atelectasis and bilateral effusions. Electronically Signed   By: Franchot Gallo M.D.   On: 04/30/2018 07:16   Dg Chest  Port 1 View  Result Date: 04/29/2018 CLINICAL DATA:  Hypoxia EXAM: PORTABLE CHEST 1 VIEW COMPARISON:  April 28, 2018 FINDINGS: Endotracheal tube tip is 2.1 cm above the carina. Right central catheter tip is in the superior vena cava. Left central catheter tip is at the junction of the left innominate vein and superior vena cava, stable. Nasogastric tube tip and side port below the diaphragm. No pneumothorax. There is bibasilar atelectasis with small pleural effusion on the right. No consolidation. Heart is mildly enlarged with pulmonary vascularity normal. No adenopathy. No bone lesions. IMPRESSION: Tube and catheter positions as described without pneumothorax. Mild bibasilar atelectasis. Small right pleural effusion. No consolidation. Stable cardiac prominence. Electronically Signed   By: Lowella Grip III M.D.   On: 04/29/2018 07:34   Dg Chest Port 1 View  Result Date: 04/28/2018 CLINICAL DATA:  ETT placement and HD catheter placement EXAM: PORTABLE CHEST 1 VIEW COMPARISON:  04/27/2018. FINDINGS: Cardiomegaly. HD catheter tip distal SVC. ET tube tip 3.3 cm above carina. BILATERAL pulmonary opacities, RIGHT worse than LEFT, consistent with pulmonary edema. Central venous catheter tip unchanged from LEFT IJ approach, proximal SVC. No pneumothorax. IMPRESSION: Cardiomegaly with pulmonary edema may be minimally worse. ET tube and HD catheter appear in satisfactory position. No pneumothorax. Electronically Signed   By: Staci Righter M.D.   On: 04/28/2018 16:57   Dg Chest Port 1 View  Result Date: 04/27/2018 CLINICAL DATA:  Acute respiratory failure EXAM: PORTABLE CHEST 1 VIEW COMPARISON:  04/25/2018 FINDINGS: Bilateral mild interstitial thickening with patchy alveolar airspace opacities at bilateral lung bases. No pleural effusion or pneumothorax. Stable cardiomediastinal silhouette. Left IJ central venous catheter with the tip projecting over the SVC in unchanged position. No acute osseous abnormality.  IMPRESSION: 1. Bilateral interstitial thickening concerning for mild interstitial edema versus interstitial infection. Electronically Signed   By: Kathreen Devoid   On: 04/27/2018 08:37   Dg Chest Port 1 View  Result Date: 04/25/2018 CLINICAL DATA:  Central line placement. EXAM: PORTABLE CHEST 1 VIEW COMPARISON:  Chest radiograph performed 04/24/2018 FINDINGS: The patient's left the IJ line is noted ending about the proximal SVC. Mild vascular congestion is noted. Mild bibasilar atelectasis is seen. No pleural effusion or pneumothorax is seen, though the lung bases are incompletely characterized. The cardiomediastinal silhouette is borderline normal in size.  No acute osseous abnormalities are identified. IMPRESSION: 1. Left IJ line noted ending about the proximal SVC. No pneumothorax. 2. Mild vascular congestion noted; mild bibasilar atelectasis seen. Electronically Signed   By: Garald Balding M.D.   On: 04/25/2018 03:57   Dg Chest Portable 1 View  Result Date: 04/24/2018 CLINICAL DATA:  Initial evaluation for acute sepsis. EXAM: PORTABLE CHEST 1 VIEW COMPARISON:  Prior radiograph from 12/02/2017. FINDINGS: Moderate cardiomegaly, stable. Mediastinal silhouette within normal limits. No focal infiltrates. No pulmonary edema or visible pleural effusion. No pneumothorax. Osseous structures within normal limits. IMPRESSION: 1. No active cardiopulmonary disease. 2. Stable cardiomegaly without pulmonary edema. Electronically Signed   By: Jeannine Boga M.D.   On: 04/24/2018 22:19     CBC Recent Labs  Lab 05/03/18 0427 05/04/18 0445 05/06/18 0345 05/07/18 0418 05/07/18 1012 05/08/18 0418  WBC 17.3* 10.2 14.0* 13.1* 13.4* 13.2*  HGB 8.2* 7.7* 7.7* 7.0* 6.9* 7.9*  HCT 27.0* 25.4* 25.8* 23.1* 23.1* 26.5*  PLT 262 227 265 225 230 176  MCV 80.6 80.9 82.4 81.6 82.2 83.1  MCH 24.5* 24.5* 24.6* 24.7* 24.6* 24.8*  MCHC 30.4 30.3 29.8* 30.3 29.9* 29.8*  RDW 18.2* 18.2* 18.7* 18.5* 18.6* 18.1*  LYMPHSABS  2.1  --   --   --   --   --   MONOABS 0.9  --   --   --   --   --   EOSABS 0.6  --   --   --   --   --   BASOSABS 0.1  --   --   --   --   --     Chemistries  Recent Labs  Lab 05/03/18 0427 05/04/18 0445 05/05/18 0524 05/06/18 0345 05/07/18 0418 05/08/18 0418  NA 135 137 136 138  137 136 138  K 3.8 4.6 4.5 4.3  4.3 3.6 3.8  CL 100* 100* 100* 102  102 102 104  CO2 23 25 26 26  25 26 27   GLUCOSE 99 195* 189* 191*  189* 113* 126*  BUN 31* 36* 44* 52*  53* 32* 16  CREATININE 5.12* 5.65* 6.09* 6.65*  6.58* 4.69* 3.23*  CALCIUM 8.0* 8.6* 8.7* 8.4*  8.4* 7.8* 8.2*  MG  --   --   --   --  1.6* 1.8  AST 14*  --   --  11*  --   --   ALT 7*  --   --  10*  --   --   ALKPHOS 103  --   --  88  --   --   BILITOT 0.4  --   --  0.2*  --   --    ------------------------------------------------------------------------------------------------------------------ No results for input(s): CHOL, HDL, LDLCALC, TRIG, CHOLHDL, LDLDIRECT in the last 72 hours.  Lab Results  Component Value Date   HGBA1C 8.4 (H) 03/24/2018   ------------------------------------------------------------------------------------------------------------------ No results for input(s): TSH, T4TOTAL, T3FREE, THYROIDAB in the last 72 hours.  Invalid input(s): FREET3 ------------------------------------------------------------------------------------------------------------------ Recent Labs    05/06/18 0345 05/06/18 0347 05/06/18 2320  VITAMINB12 462  --  418  FOLATE  --  9.8 6.1  FERRITIN  --   --  234  TIBC  --   --  214*  IRON  --   --  29  RETICCTPCT  --   --  1.8    Coagulation profile Recent Labs  Lab 05/03/18 1418  INR 1.28    No results for input(s): DDIMER in the  last 72 hours.  Cardiac Enzymes Recent Labs  Lab 05/02/18 2326 05/03/18 0427 05/03/18 1108  TROPONINI 0.36* 0.35* 0.30*    ------------------------------------------------------------------------------------------------------------------ No results found for: BNP   Roxan Hockey M.D on 05/08/2018 at 6:53 PM  Between 7am to 7pm - Pager - [phone]  After 7pm go to [email or web address] - password [email or web address]  [name] -  Office  [phone]   Voice Recognition Viviann Spare dictation system was used to create this note, attempts have been made to correct errors. Please contact the author with questions and/or clarifications.

## 2018-05-08 NOTE — Progress Notes (Signed)
EKG done , changed in rhythm. VS taken. No c/o chest pains SOB nor palpitations

## 2018-05-08 NOTE — Evaluation (Deleted)
Physical Therapy Evaluation Patient Details Name: [name] MRN: [id number] DOB: 22-Apr-1966 Today's Date: 05/08/2018   History of Present Illness  52 yo female in renal failure and recommended to HD but now is declining to have it.  Pt has Cr of 17, BUN 144, interstitial lung edema and CHF symptoms,  and pt is aware of the risks of refusing therapy of HD.  Pt has also declined a transfusion for hgb of 7.1.  PMHx:  ESRD, CHF, anemia, HTN,   Clinical Impression  Pt is not able to stand up today, demonstrates difficulty with PT information about slidng on side of bed.  Pt requested the walker for this activity and finally stated she could not use it.  Pt has been home with family and will need to be more independent and self aware of her limits and condition to succeed.  Follow acutely for strengthening and balance training in both sitting and standing to work toward at least a sliding board or scooting transfer for home.    Follow Up Recommendations SNF    Equipment Recommendations  None recommended by PT    Recommendations for Other Services       Precautions / Restrictions Precautions Precautions: Fall Precaution Comments: telemetry Restrictions Weight Bearing Restrictions: Yes Other Position/Activity Restrictions: NWB on L Leg      Mobility  Bed Mobility Overal bed mobility: Needs Assistance Bed Mobility: Supine to Sit;Sit to Supine     Supine to sit: Mod assist Sit to supine: Mod assist   General bed mobility comments: assisting with dense cues for sequence, support to trunk  Transfers Overall transfer level: Needs assistance Equipment used: 1 person hand held assist Transfers: Lateral/Scoot Transfers;Sit to/from Stand Sit to Stand: Total assist        Lateral/Scoot Transfers: Mod assist General transfer comment: pt cannot stand but can assist with slide using bed pad and support of RUE  Ambulation/Gait             General Gait Details: unable  Stairs            Wheelchair Mobility    Modified Rankin (Stroke Patients Only)       Balance Overall balance assessment: Needs assistance Sitting-balance support: Feet supported;Bilateral upper extremity supported Sitting balance-Leahy Scale: Fair       Standing balance-Leahy Scale: Zero                               Pertinent Vitals/Pain Pain Assessment: No/denies pain Faces Pain Scale: Hurts even more Pain Location: L leg Pain Intervention(s): Limited activity within patient's tolerance;Monitored during session;Premedicated before session;Repositioned    Home Living Family/patient expects to be discharged to:: Inpatient rehab                      Prior Function Level of Independence: Independent with assistive device(s)               Hand Dominance   Dominant Hand: Right    Extremity/Trunk Assessment   Upper Extremity Assessment Upper Extremity Assessment: Overall WFL for tasks assessed    Lower Extremity Assessment Lower Extremity Assessment: [name] deficits/detail;LLE deficits/detail [name] Deficits / Details: strength was 4- to 4+ [name] Coordination: decreased fine motor;decreased gross motor LLE: Unable to fully assess due to pain LLE Coordination: decreased gross motor    Cervical / Trunk Assessment Cervical / Trunk Assessment: Normal  Communication   Communication:  No difficulties  Cognition Arousal/Alertness: Awake/alert Behavior During Therapy: WFL for tasks assessed/performed Overall Cognitive Status: Within Functional Limits for tasks assessed                                        General Comments General comments (skin integrity, edema, etc.): clean incision on L thigh with staples    Exercises     Assessment/Plan    PT Assessment Patient needs continued PT services  PT Problem List Decreased strength;Decreased range of motion;Decreased balance;Decreased activity tolerance;Decreased mobility;Decreased  coordination;Decreased cognition;Decreased knowledge of use of DME;Decreased safety awareness;Decreased knowledge of precautions;Obesity;Decreased skin integrity;Pain       PT Treatment Interventions DME instruction;Gait training;Functional mobility training;Therapeutic activities;Therapeutic exercise;Balance training;Neuromuscular re-education;Patient/family education    PT Goals (Current goals can be found in the Care Plan section)  Acute Rehab PT Goals Patient Stated Goal: to get up and stand PT Goal Formulation: With patient Time For Goal Achievement: 05/22/18 Potential to Achieve Goals: Good    Frequency Min 2X/week   Barriers to discharge Inaccessible home environment;Decreased caregiver support has family at home but cannot stand, cannot slide well    Co-evaluation               AM-PAC PT "6 Clicks" Daily Activity  Outcome Measure Difficulty turning over in bed (including adjusting bedclothes, sheets and blankets)?: Unable Difficulty moving from lying on back to sitting on the side of the bed? : Unable Difficulty sitting down on and standing up from a chair with arms (e.g., wheelchair, bedside commode, etc,.)?: Unable Help needed moving to and from a bed to chair (including a wheelchair)?: A Lot Help needed walking in hospital room?: Total Help needed climbing 3-5 steps with a railing? : Total 6 Click Score: 7    End of Session Equipment Utilized During Treatment: Gait belt Activity Tolerance: Patient limited by fatigue;Treatment limited secondary to medical complications (Comment) Patient left: in bed;with call bell/phone within reach;with bed alarm set;with nursing/sitter in room Nurse Communication: Mobility status PT Visit Diagnosis: Unsteadiness on feet (R26.81);Muscle weakness (generalized) (M62.81)    Time: 3785-8850 PT Time Calculation (min) (ACUTE ONLY): 30 min   Charges:   PT Evaluation $PT Eval Moderate Complexity: 1 Mod PT Treatments $Therapeutic  Activity: 8-22 mins   PT G Codes:   PT G-Codes **NOT FOR INPATIENT CLASS** Functional Assessment Tool Used: AM-PAC 6 Clicks Basic Mobility    Ramond Dial 05/08/2018, 4:23 PM   Mee Hives, PT MS Acute Rehab Dept. Number: [facility] [place]

## 2018-05-08 NOTE — Progress Notes (Signed)
Patient pulled out her flexi-seal and there was loose stool all over the bed. Patient is changed and back to bed, ordered another flexi-seal per Dr's order to put back. Patient is talked to about not pulling tubes out, she said okay. Will continue to monitor.

## 2018-05-08 NOTE — Progress Notes (Signed)
[facility] Progress Note    Assessment/ Plan:   1. Anuric AKI from ATN, normal baseline Scr.  Started HD 04/29/18, s/p TDC with IR 6/19, pulled out temp cath. HD 6/19 and today 6/20, will assess renal recovery- nothing really meaningful yet,  Next HD planne 6/22. 2. 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 Septic shock, on hydrocortisone.  Off pressor, s/p antibiotics.   4. DM2 5. NSTEMI, cardiology following new depressed LVEF and RWMA on 6/11 TTE; needs LHC--> have discussed with cardiology, holding off on cath for now until potential for renal recovery more clearly elucidated 6. Hx/o AFib; RVR, on hep gtt. 7. VDRF, extubated 8. Dispo: pending assessment of renal recovery and cardiac workup  Subjective:    Sleeping, easily arousable.     Objective:   BP 140/82   Pulse 92   Temp 97.9 F (36.6 C) (Oral)   Resp 20   Ht 5\' 7"  (1.702 m)   Wt 91.8 kg (202 lb 6.1 oz)   SpO2 100%   BMI 31.70 kg/m   Intake/Output Summary (Last 24 hours) at 05/08/2018 1602 Last data filed at 05/08/2018 1442 Gross per 24 hour  Intake 1550 ml  Output -  Net 1550 ml   Weight change: -0.2 kg (-7.1 oz)  Physical Exam: XBJ:YNWGNFAOZHY ill-appearing, NAD, lying in bed QMV:HQIO, poor dentition with mult caries CV:RRR, no rub, normal S1 and S2 CHEST: R IJ TDC in place PULM:CTAB anteriorly NGE:XBMW, nontender UXL:KGMWN lower extremity without edema, LLE s/p AKA with staple line c/d/i  Imaging: No results found.  Labs: BMET Recent Labs  Lab 05/02/18 2314 05/03/18 0427 05/04/18 0445 05/05/18 0524 05/06/18 0345 05/07/18 0418 05/08/18 0418  NA 138 135 137 136 138  137 136 138  K 4.3 3.8 4.6 4.5 4.3  4.3 3.6 3.8  CL 100* 100* 100* 100* 102  102 102 104  CO2 27 23 25 26 26  25 26 27   GLUCOSE 123* 99 195* 189* 191*  189* 113* 126*  BUN 31* 31* 36* 44* 52*  53* 32* 16  CREATININE 4.86* 5.12* 5.65* 6.09* 6.65*  6.58* 4.69* 3.23*  CALCIUM 7.9* 8.0* 8.6*  8.7* 8.4*  8.4* 7.8* 8.2*  PHOS  --   --   --   --  7.8* 4.8*  --    CBC Recent Labs  Lab 05/03/18 0427  05/06/18 0345 05/07/18 0418 05/07/18 1012 05/08/18 0418  WBC 17.3*   < > 14.0* 13.1* 13.4* 13.2*  NEUTROABS 13.1*  --   --   --   --   --   HGB 8.2*   < > 7.7* 7.0* 6.9* 7.9*  HCT 27.0*   < > 25.8* 23.1* 23.1* 26.5*  MCV 80.6   < > 82.4 81.6 82.2 83.1  PLT 262   < > 265 225 230 176   < > = values in this interval not displayed.    Medications:    . sodium chloride   Intravenous Once  . amiodarone  400 mg Oral Daily  . aspirin  81 mg Oral Daily  . chlorhexidine  15 mL Mouth Rinse BID  . Chlorhexidine Gluconate Cloth  6 each Topical Q0600  . darbepoetin (ARANESP) injection - DIALYSIS  100 mcg Intravenous Q Thu-HD  . docusate sodium  100 mg Oral BID  . feeding supplement (PRO-STAT SUGAR FREE 64)  30 mL Oral TID BM  . ferrous sulfate  325 mg Oral BID WC  .  heparin injection (subcutaneous)  5,000 Units Subcutaneous Q8H  . insulin aspart  0-5 Units Subcutaneous QHS  . insulin aspart  0-9 Units Subcutaneous TID WC  . mouth rinse  15 mL Mouth Rinse q12n4p  . multivitamin  1 tablet Oral QHS  . pantoprazole  40 mg Oral BID AC  . rosuvastatin  20 mg Oral q1800  . sodium chloride flush  10-40 mL Intracatheter Q12H      Madelon Lips MD 05/08/2018, 4:02 PM

## 2018-05-08 NOTE — Progress Notes (Signed)
Nutrition Follow-up  DOCUMENTATION CODES:   Not applicable  INTERVENTION:   -Continue renal MVI daily -30 ml Prostat TID with meals, each supplement provides 100 kcals and 15 grams protein -Snacks TID between meals -Continue with dysphagia 3 diet with thin liquids  NUTRITION DIAGNOSIS:   Increased nutrient needs related to wound healing as evidenced by estimated needs.  Ongoing  GOAL:   Patient will meet greater than or equal to 90% of their needs  Progressing  MONITOR:   PO intake, Supplement acceptance, Labs, Weight trends, Skin, I & O's  REASON FOR ASSESSMENT:   Consult Enteral/tube feeding initiation and management  ASSESSMENT:   52 y/o female with CAD and peripheral arterial disease was admitted with sepsis from an ischemic lower extremity eventually requiring amputation (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).  She also had an NSTEMI with new cardiomyopathy.  6/9- s/p lt AKA 6/13- rectal tube in place 6/17- pulled out HD cath 6/18- s/p rt IJ HD cath placement 6/21- pulled IJ cath out  Reviewed I/O's: -805 ml x 24 hours and +20.4 L since admission.   Per RN notes, pt pulled out rectal tube today.   Spoke with pt at bedside, who reports she believes her appetite has been improving. Pt remains with variable intake; meal completions 5-100% (average intake about 25%). Observed pt lunch tray- pt consumed only bites (chicken appeared untouched and consumed 2 bites of dinner roll). Dessert was untouched, however, pt reports that she is trying to avoid sweets to help control her blood sugar.   Discussed with pt importance of good meal and supplement intake to promote healing. Noted pt with minimal teeth, however, reports she is tolerating diet without difficulty. Offered to further mechanically downgrade her diet, however, pt refused.   Labs reviewed: CBGS: 91-123 (inpatient orders for glycmeic control are 0-5 units insulin aspart q HS and 0-9 units insulin aspart TID).    Diet Order:   Diet Order           DIET DYS 3 Room service appropriate? Yes; Fluid consistency: Thin  Diet effective now          EDUCATION NEEDS:   Not appropriate for education at this time  Skin:  Skin Assessment: Skin Integrity Issues: Skin Integrity Issues:: Stage III, Incisions Stage III: buttocks Incisions: left leg s/p BKA  Last BM:  05/08/18 (via rectal tube)  Height:   Ht Readings from Last 1 Encounters:  05/02/18 5\' 7"  (1.702 m)    Weight:   Wt Readings from Last 1 Encounters:  05/08/18 202 lb 6.1 oz (91.8 kg)    Ideal Body Weight:  57.1 kg(adjusted L BKA)  BMI:  Body mass index is 31.7 kg/m.  Estimated Nutritional Needs:   Kcal:  1700-2000kcal/day   Protein:  80-95g/day   Fluid:  >1.5L/day     Loleta Frommelt A. Jimmye Norman, RD, LDN, CDE Pager: [phone] After hours Pager: [phone]

## 2018-05-08 NOTE — Progress Notes (Addendum)
Progress Note  Patient Name: [name] Date of Encounter: 05/08/2018  Primary Cardiologist: Nelva Bush, MD  Subjective   Pulled out central line last night and rectal tube this AM. Her mental status seems about the same as yesterday - appropriate with questions but overall judgement/insight seems impaired.  Inpatient Medications    Scheduled Meds: . sodium chloride   Intravenous Once  . aspirin  81 mg Oral Daily  . chlorhexidine  15 mL Mouth Rinse BID  . Chlorhexidine Gluconate Cloth  6 each Topical Q0600  . darbepoetin (ARANESP) injection - DIALYSIS  100 mcg Intravenous Q Thu-HD  . docusate sodium  100 mg Oral BID  . ferrous sulfate  325 mg Oral BID WC  . heparin injection (subcutaneous)  5,000 Units Subcutaneous Q8H  . insulin aspart  0-5 Units Subcutaneous QHS  . insulin aspart  0-9 Units Subcutaneous TID WC  . mouth rinse  15 mL Mouth Rinse q12n4p  . multivitamin  1 tablet Oral QHS  . pantoprazole  40 mg Oral BID AC  . rosuvastatin  20 mg Oral q1800  . sodium chloride flush  10-40 mL Intracatheter Q12H   Continuous Infusions: . sodium chloride 10 mL/hr at 05/08/18 0027   PRN Meds: acetaminophen, alteplase, fentaNYL (SUBLIMAZE) injection, heparin, metoprolol tartrate, nitroGLYCERIN, ondansetron (ZOFRAN) IV, oxyCODONE, phenol, sodium chloride flush   Vital Signs    Vitals:   05/08/18 0010 05/08/18 0156 05/08/18 0348 05/08/18 0417  BP: 110/63 97/62  92/64  Pulse: 89 96  (!) 101  Resp: 16   18  Temp: 97.7 F (36.5 C)   98 F (36.7 C)  TempSrc: Oral   Oral  SpO2: 99%   96%  Weight:   202 lb 6.1 oz (91.8 kg)   Height:        Intake/Output Summary (Last 24 hours) at 05/08/2018 1108 Last data filed at 05/08/2018 0900 Gross per 24 hour  Intake 1680 ml  Output 2365 ml  Net -685 ml   Filed Weights   05/07/18 0431 05/07/18 1005 05/08/18 0348  Weight: 202 lb 9.6 oz (91.9 kg) 204 lb 12.9 oz (92.9 kg) 202 lb 6.1 oz (91.8 kg)    Telemetry   Paroxysms  of rapid atrial fibrillation and NSR - Personally Reviewed  Physical Exam   GEN: No acute distress.  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 Cardiac: RRR no murmurs, rubs, or gallops.  Radials/DP/PT 1+ and equal bilaterally.  Respiratory: Clear to auscultation bilaterally. Breathing is unlabored. GI: Soft, nontender, non-distended, BS +x 4. MS: s/p LLE amputation Extremities: No clubbing or cyanosis. 1+ pale pitting RLL edema. Distal pedal pulses are 2+ and equal bilaterally. Neuro:  AAOx3 but struggled with the location question before finding the answer. Follows commands. Psych:  Responds to questions appropriately with a normal affect.  Labs    Chemistry Recent Labs  Lab 05/03/18 0427  05/06/18 0345 05/07/18 0418 05/08/18 0418  NA 135   < > 138  137 136 138  K 3.8   < > 4.3  4.3 3.6 3.8  CL 100*   < > 102  102 102 104  CO2 23   < > 26  25 26 27   GLUCOSE 99   < > 191*  189* 113* 126*  BUN 31*   < > 52*  53* 32* 16  CREATININE 5.12*   < > 6.65*  6.58* 4.69* 3.23*  CALCIUM 8.0*   < > 8.4*  8.4* 7.8* 8.2*  PROT 5.4*  --  5.4*  --   --   ALBUMIN 1.5*  --  1.9*  1.9*  --   --   AST 14*  --  11*  --   --   ALT 7*  --  10*  --   --   ALKPHOS 103  --  88  --   --   BILITOT 0.4  --  0.2*  --   --   GFRNONAA 9*   < > 6*  6* 10* 15*  GFRAA 10*   < > 7*  7* 11* 18*  ANIONGAP 12   < > 10  10 8 7    < > = values in this interval not displayed.     Hematology Recent Labs  Lab 05/07/18 0418 05/07/18 1012 05/08/18 0418  WBC 13.1* 13.4* 13.2*  RBC 2.83* 2.81* 3.19*  HGB 7.0* 6.9* 7.9*  HCT 23.1* 23.1* 26.5*  MCV 81.6 82.2 83.1  MCH 24.7* 24.6* 24.8*  MCHC 30.3 29.9* 29.8*  RDW 18.5* 18.6* 18.1*  PLT 225 230 176    Cardiac Enzymes Recent Labs  Lab 05/02/18 2326 05/03/18 0427 05/03/18 1108  TROPONINI 0.36* 0.35* 0.30*   No results for input(s):  TROPIPOC in the last 168 hours.    Radiology    No results found.  Cardiac Studies   2D echo 04/28/18 Study Conclusions - Left ventricle: The cavity size was normal. There was mild concentric hypertrophy. Systolic function was moderately to severely reduced. The estimated ejection fraction was in the range of 30% to 35%. Features are consistent with a pseudonormal left ventricular filling pattern, with concomitant abnormal relaxation and increased filling pressure (grade 2 diastolic dysfunction). Doppler parameters are consistent with elevated ventricular end-diastolic filling pressure. - Mitral valve: Calcified annulus. Moderately thickened, moderately calcified leaflets . There was mild regurgitation. - Left atrium: The atrium was mildly dilated. - Right ventricle: The cavity size was moderately dilated. Wall thickness was normal. Systolic function was moderately reduced. - Tricuspid valve: There was mild regurgitation. - Pulmonary arteries: Systolic pressure was mildly increased. PA peak pressure: 34 mm Hg (S). - Pericardium, extracardiac: A trivial pericardial effusion was identified. Features were not consistent with tamponade physiology Impressions: - When compared to the prior study from 04/17/2017 LVEF has decreased from 55-60% to 30-35% with new wall motion abnormalities - diffuse hypokinesis and akinesis of the basal and mid anteroseptal, anterior and apical septal and anterior walls. RVEF is moderately decreased.    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 H/o chronicwounds ofleftfoot withpriorABF and Fem-Pop Bypass,recently admitted from May 10-14 for redo of left femoral to PTA bypass with cryopreserved vein.Was seen in vascular  office 6/3 and was febrile/hypotensive -> advised to go to ER as she was felt to require limb amputation but patient declined, citing she wanted to go on pre-planned camping trip. Was admitted with septic shock due to LLE gangrene with multiorgan dysfunction - hypotension requiring pressors, renal failure requiring initiation of dialysis, elevated troponin of 7.7, and new LV dysfunction with drop in EF to 30-35% with new WMA. 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 Also found to have recurrent paroxysmal atrial fib during admission.  Assessment & Plan  1. Septic shock with multiple organ involvement as above, acute renal failure requiring HD initiation, pressors - being managed by primary team/nephrology.Remains on HD per renal. Cr is noted to be decreasing but remains oliguric per I/O's although patient states she spent yesterday wetting the bed?.  2. New cardiomyopathy - BP prohibits med titration at present time. No ACEI/ARB/spiro at present time given renal failure. See below re: cath.Overall appears euvolemic although weights have trended upwards this admission. They do appear to be steadily declining (173 on adm, peak 209, now 202). Volume currently begin managed with HD.  3. NSTEMI -suspected demand ischemia due to septic shock with high level of suspicion for underlying CAD. Plan cardiac cath for evaluation of coronaries once renal function improves or she is clearly ESRD. Also need to ensure stability of anemia. Continue current regimen but see below regarding anemia. Would not add Plavix given concomitant need for [facility] for afib as well.  5.  Acute on chronic normocytic anemia - Hgb variable this admission but declined to 6.9 yesterday - flexiseal rectal tube showed melanotic stool which was tested and returned heme-positive. Rec'd 1 U PRBC. Not sure what further workup for this is, will defer to IM re: further management and if OK to continue aspirin. Now on protonix BID. We did stop IV heparin given that  she had completed >48 hours of therapy and had been maintaining NSR. Unfortunately she is now showing paroxysms of atrial fib recurrence on telemetry. She does have slightly prolonged QT so will need to be monitored closely but only good option for antiarrhythmic therapy is amiodarone - per d/w Dr. Sallyanne Kuster, start 400mg  daily. Will recheck baseline TSH in AM (normal 11/2017).  6. Paroxysmal atrial fib- as above.  For questions or updates, please contact [place] Please consult [email or web address] for contact info under Cardiology/STEMI.  Signed, Charlie Pitter, PA-C 05/08/2018, 11:08 AM   I have seen and examined the patient along with Charlie Pitter, PA-C.  I have reviewed the chart, notes and new data.  I agree with PA/NP's note.  Key new complaints: No cardiovascular complaints.  Oligo anuric.  Key examination changes: Telemetry shows a lengthy episode of atrial fibrillation with mild RVR overnight and a shorter episode earlier today.  Currently in sinus rhythm with frequent PACs.  Blood pressure is low. Key new findings / data: Renal parameters better due to dialysis.  Received 1 unit of transfused packed red blood cells but remains markedly anemic.  I would not restart anticoagulation due to the ongoing bleeding risk.  Her low blood pressure precludes conventional AV nodal blocking agents and will start amiodarone for rhythm and rate control.  Monitor QT interval.  PLAN: Although Mrs. Sirmons appears to have survived her initial acute critical illness with sepsis, she is still severely ill and her prognosis is poor.  Suspect underlying multivessel CAD, but unable to perform further evaluation due to renal failure and bleeding issues.  She remains volume overloaded, but without respiratory difficulty.  Sanda Klein, MD, [place] [phone] 05/08/2018, 3:21 PM

## 2018-05-08 NOTE — Progress Notes (Signed)
Pt accidentally pulled out left IJ. pressure dsg applied, IV team called redressed site. Cath that was pulled out intact. No more bleeding noted.,[facility] NP [place] made aware. Former central site monitored for bleeding. No bleeding noted

## 2018-05-09 LAB — BASIC METABOLIC PANEL
ANION GAP: 10 (ref 5–15)
BUN: 25 mg/dL — ABNORMAL HIGH (ref 6–20)
CHLORIDE: 102 mmol/L (ref 101–111)
CO2: 25 mmol/L (ref 22–32)
Calcium: 8.6 mg/dL — ABNORMAL LOW (ref 8.9–10.3)
Creatinine, Ser: 3.86 mg/dL — ABNORMAL HIGH (ref 0.44–1.00)
GFR calc non Af Amer: 12 mL/min — ABNORMAL LOW (ref >60)
GFR, EST AFRICAN AMERICAN: 14 mL/min — AB (ref >60)
GLUCOSE: 110 mg/dL — AB (ref 65–99)
POTASSIUM: 3.9 mmol/L (ref 3.5–5.1)
Sodium: 137 mmol/L (ref 135–145)

## 2018-05-09 LAB — RENAL FUNCTION PANEL
ANION GAP: 8 (ref 5–15)
Albumin: 1.9 g/dL — ABNORMAL LOW (ref 3.5–5.0)
BUN: 25 mg/dL — ABNORMAL HIGH (ref 6–20)
CALCIUM: 8.4 mg/dL — AB (ref 8.9–10.3)
CHLORIDE: 102 mmol/L (ref 101–111)
CO2: 27 mmol/L (ref 22–32)
Creatinine, Ser: 3.65 mg/dL — ABNORMAL HIGH (ref 0.44–1.00)
GFR calc Af Amer: 15 mL/min — ABNORMAL LOW (ref >60)
GFR calc non Af Amer: 13 mL/min — ABNORMAL LOW (ref >60)
GLUCOSE: 112 mg/dL — AB (ref 65–99)
POTASSIUM: 3.8 mmol/L (ref 3.5–5.1)
Phosphorus: 5.2 mg/dL — ABNORMAL HIGH (ref 2.5–4.6)
SODIUM: 137 mmol/L (ref 135–145)

## 2018-05-09 LAB — CBC
HEMATOCRIT: 30 % — AB (ref 36.0–46.0)
HEMOGLOBIN: 9 g/dL — AB (ref 12.0–15.0)
MCH: 25.4 pg — ABNORMAL LOW (ref 26.0–34.0)
MCHC: 30 g/dL (ref 30.0–36.0)
MCV: 84.5 fL (ref 78.0–100.0)
Platelets: 211 10*3/uL (ref 150–400)
RBC: 3.55 MIL/uL — AB (ref 3.87–5.11)
RDW: 18.9 % — ABNORMAL HIGH (ref 11.5–15.5)
WBC: 13 10*3/uL — AB (ref 4.0–10.5)

## 2018-05-09 LAB — TSH: TSH: 2.708 u[IU]/mL (ref 0.350–4.500)

## 2018-05-09 LAB — GLUCOSE, CAPILLARY
GLUCOSE-CAPILLARY: 143 mg/dL — AB (ref 65–99)
Glucose-Capillary: 105 mg/dL — ABNORMAL HIGH (ref 65–99)
Glucose-Capillary: 85 mg/dL (ref 65–99)
Glucose-Capillary: 124 mg/dL — ABNORMAL HIGH (ref 65–99)

## 2018-05-09 LAB — MAGNESIUM: Magnesium: 1.7 mg/dL (ref 1.7–2.4)

## 2018-05-09 MED ORDER — DIPHENOXYLATE-ATROPINE 2.5-0.025 MG PO TABS
1.0000 | ORAL_TABLET | Freq: Three times a day (TID) | ORAL | Status: DC
  Administered 2018-05-09 – 2018-05-10 (×3): 1 via ORAL
  Filled 2018-05-09 (×3): qty 1

## 2018-05-09 NOTE — Progress Notes (Signed)
Got report from dialysis  RN Melisa, waiting for the patient to get back on the floor.

## 2018-05-09 NOTE — Progress Notes (Signed)
Patient Demographics:    [name], is a 52 y.o. female, DOB - [birth year], DJM:[id number]  Admit date - 04/24/2018   Admitting Physician [name], MD  Outpatient Primary MD for the patient is [name], MD  LOS - 80   Chief Complaint  Patient presents with  . 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)   PAD (peripheral artery disease) (HCC)   Pressure injury of skin   DM2 (diabetes mellitus, type 2) (HCC)   Acute kidney injury ([place])   Buttock wound, left, subsequent encounter   Paroxysmal atrial fibrillation with rapid ventricular response (HCC)   Lower limb ischemia   Microcytic anemia   Gangrene of lower extremity (HCC)   Acute renal failure (HCC)   Acute respiratory failure (HCC)   Sepsis (HCC)   Acute encephalopathy   Acute combined systolic and diastolic heart failure (HCC)   Non-ST elevation (NSTEMI) myocardial infarction [facility])   Brief Narrative:  52 y/o WF  PMHx  Depression, Diabetes mellitus type II uncontrolled with complication, Diabetic neuropathy ,  Femoral-popliteal bypass graft occlusion, 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).She also had a NSTEMI with new cardiomyopathy.   Plan:- 1)Acute on chronic Anemia--  hemoglobin up to 9.0 from 6.9 after transfusion of 1 unit of packed cells on 05/07/2018, and Aranesp shot x1 on 05/07/18,  Hemoccult positive, heparin drip has been discontinued by cardiology team, given CAD  try to keep hemoglobin at least close to 9, c/n  Aranesp per nephrology team  2)NSTEMI--suspect due to demand ischemia in the setting of septic shock, clinically high index of suspicion for underlying CAD-cardiology input appreciated, IV heparin drip discontinued after 48 hours per cardiology team, cardiology team will consider low-dose beta-blocker if blood pressure allows, at this time patient unable to tolerate ACE/ARB/Aldactone due to renal concerns, c/n aspirin and crestor  3)AKI/ATN-renal failure in the setting of sepsis with multiorgan failure and persistent hypotension requiring pressors, no significant renal recovery, limited  urine output, hemodialysis was started on 04/29/2018, last hemodialysis session on 05/09/2018,  4)PAFib--- this is not new, unable to anticoagulate due to #1 above, rate controlled better with amiodarone as ordered by cardiology team  5)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,  6)HFrEF--patient with combined systolic and diastolic dysfunction CHF, echo with EF of 35% with grade 2 diastolic dysfunction, at this time patient unable to tolerate ACE/ARB/Aldactone due to renal concerns, continue to use hemodialysis to address volume status, weight is down more than 17 pounds since admission  7)Developmental delay/cognitive deficits--- baseline patient has cognitive deficits which have worsened since acute infection  8)DM2-poor control, A1c 8.4, Use Novolog/Humalog Sliding scale insulin with Accu-Cheks/Fingersticks as ordered  9)Diarrhea--patient reports long-standing history of intermittent diarrhea, C. difficile testing previously negative, treat  empirically with Lomotil   Code Status : Full  Disposition Plan  : TBD, need to resolve hemodialysis issues first  Consults  :  PCCM/cardiology  DVT Prophylaxis  :   Heparin [place]  Lab Results  Component Value Date   PLT 211 05/09/2018    Inpatient Medications  Scheduled Meds: . sodium chloride   Intravenous  Once  . amiodarone  400 mg Oral Daily  . aspirin  81 mg Oral Daily  . chlorhexidine  15 mL Mouth Rinse BID  . Chlorhexidine Gluconate Cloth  6 each Topical Q0600  . darbepoetin (ARANESP) injection - DIALYSIS  100 mcg Intravenous Q Thu-HD  . diphenoxylate-atropine  1 tablet Oral TID  . docusate sodium  100 mg Oral BID  . feeding supplement (PRO-STAT SUGAR FREE 64)  30 mL Oral TID BM  . ferrous sulfate  325 mg Oral BID WC  . heparin injection (subcutaneous)  5,000 Units Subcutaneous Q8H  . insulin aspart  0-5 Units Subcutaneous QHS  . insulin aspart  0-9 Units Subcutaneous TID WC  . mouth rinse  15 mL Mouth Rinse q12n4p  . multivitamin  1 tablet Oral QHS  . pantoprazole  40 mg Oral BID AC  . rosuvastatin  20 mg Oral q1800  . sodium chloride flush  10-40 mL Intracatheter Q12H   Continuous Infusions: . sodium chloride 10 mL/hr at 05/08/18 0027   PRN Meds:.acetaminophen, alteplase, fentaNYL (SUBLIMAZE) injection, heparin, metoprolol tartrate, nitroGLYCERIN, ondansetron (ZOFRAN) IV, oxyCODONE, phenol, sodium chloride flush    Anti-infectives (From admission, onward)   Start     Dose/Rate Route Frequency Ordered Stop   05/05/18 1354  ceFAZolin (ANCEF) 2-4 GM/100ML-% IVPB  Status:  Discontinued    Note to Pharmacy:  Gar Ponto   : cabinet override      05/05/18 1354 05/05/18 1451   05/05/18 0930  ceFAZolin (ANCEF) IVPB 2g/100 mL premix     2 g 200 mL/hr over 30 Minutes Intravenous On call 05/05/18 0924 05/06/18 0930   04/30/18 0900  vancomycin (VANCOCIN) IVPB 750 mg/150 ml premix  Status:  Discontinued     750 mg 150 mL/hr over 60 Minutes Intravenous  Once 04/30/18 0819 04/30/18 0828   04/28/18 1400  cefTAZidime (FORTAZ) 1 g in sodium chloride 0.9 % 100 mL IVPB     1 g 200 mL/hr over 30 Minutes Intravenous Every 24 hours 04/28/18 1301 05/01/18 1416   04/26/18 0600  vancomycin (VANCOCIN) IVPB 1000 mg/200 mL premix  Status:  Discontinued     1,000 mg 200 mL/hr over 60 Minutes  Intravenous Every 48 hours 04/26/18 0515 04/28/18 1152   04/25/18 1000  vancomycin (VANCOCIN) IVPB 1000 mg/200 mL premix  Status:  Discontinued     1,000 mg 200 mL/hr over 60 Minutes Intravenous Every 12 hours 04/24/18 2213 04/25/18 0215   04/25/18 0600  piperacillin-tazobactam (ZOSYN) IVPB 3.375 g  Status:  Discontinued     3.375 g 12.5 mL/hr over 240 Minutes Intravenous Every 8 hours 04/24/18 2213 04/25/18 0220   04/25/18 0600  piperacillin-tazobactam (ZOSYN) IVPB 2.25 g  Status:  Discontinued     2.25 g 100 mL/hr over 30 Minutes Intravenous Every 8 hours 04/25/18 0220 04/28/18 1152   04/24/18 2100  piperacillin-tazobactam (ZOSYN) IVPB 3.375 g     3.375 g 100 mL/hr over 30 Minutes Intravenous  Once 04/24/18 2050 04/24/18 2242   04/24/18 2100  vancomycin (VANCOCIN) IVPB 1000 mg/200 mL premix     1,000 mg 200 mL/hr over  60 Minutes Intravenous  Once 04/24/18 2050 04/24/18 2352        Objective:   Vitals:   05/09/18 1015 05/09/18 1045 05/09/18 1115 05/09/18 1400  BP: 116/69 118/69 115/62 (!) 90/58  Pulse: 94 86 85 95  Resp:   17 20  Temp:   (!) 97.4 F (36.3 C) (!) 97.3 F (36.3 C)  TempSrc:   Oral Oral  SpO2:   96% 98%  Weight:   87.4 kg (192 lb 10.9 oz)   Height:        Wt Readings from Last 3 Encounters:  05/09/18 87.4 kg (192 lb 10.9 oz)  04/20/18 72.6 kg (160 lb)  03/31/18 79.4 kg (175 lb)     Intake/Output Summary (Last 24 hours) at 05/09/2018 1553 Last data filed at 05/09/2018 1115 Gross per 24 hour  Intake 360 ml  Output 1500 ml  Net -1140 ml     Physical Exam  Gen:- Awake Alert,  In no apparent distress  HEENT:- [place].AT, No sclera icterus Neck-Supple Neck,No JVD,  Rt IJ HD catheter.  Lungs-  CTAB , good air movement  CV- S1, S2 normal Abd-  +ve B.Sounds, Abd Soft, No tenderness,    Extremity/Skin:- LT AKA intact suture/staple line Psych-affect is appropriate, oriented x3, poor judgment and cognitive concerns persist Neuro-no new focal deficits, no  tremors   Data Review:   Micro Results No results found for this or any previous visit (from the past 240 hour(s)).  Radiology Reports US Renal  Result Date: 04/25/2018 CLINICAL DATA:  Initial evaluation for acute renal injury. EXAM: RENAL / URINARY TRACT ULTRASOUND COMPLETE COMPARISON:  Prior CT from 11/30/2017 FINDINGS: Right Kidney: Length: 14.4 cm. Echogenicity within normal limits. No mass or hydronephrosis visualized. Left Kidney: Length: 13.7 cm. Echogenicity within normal limits. No mass or hydronephrosis visualized. Bladder: Bladder decompressed with a Foley catheter in place. IMPRESSION: 1. Negative renal ultrasound.  No hydronephrosis. 2. Bladder decompressed with a Foley catheter in place. Electronically Signed   By: Jeannine Boga M.D.   On: 04/25/2018 06:55   Ir Fluoro Guide Cv Line Right  Result Date: 05/05/2018 INDICATION: Acute kidney injury, no current access for dialysis EXAM: ULTRASOUND GUIDANCE FOR VASCULAR ACCESS RIGHT INTERNAL JUGULAR PERMANENT HEMODIALYSIS CATHETER Date:  05/05/2018 05/05/2018 3:15 pm Radiologist:  Jerilynn Mages. Daryll Brod, MD Guidance:  Ultrasound fluoroscopic FLUOROSCOPY TIME:  Fluoroscopy Time: 0 minutes 24 seconds (2 mGy). MEDICATIONS: Ancef 2 g administered within 1 hour of procedure ANESTHESIA/SEDATION: Versed 0.5 mg IV; Fentanyl 12.5 mcg IV; Moderate Sedation Time:  18 minutes The patient was continuously monitored during the procedure by the interventional radiology nurse under my direct supervision. CONTRAST:  None. COMPLICATIONS: None immediate. PROCEDURE: Informed consent was obtained from the patient following explanation of the procedure, risks, benefits and alternatives. The patient understands, agrees and consents for the procedure. All questions were addressed. A time out was performed. Maximal barrier sterile technique utilized including caps, mask, sterile gowns, sterile gloves, large sterile drape, hand hygiene, and 2% chlorhexidine scrub. Under  sterile conditions and local anesthesia, right internal jugular micropuncture venous access was performed with ultrasound. Images were obtained for documentation of the patent right internal jugular vein. A guide wire was inserted followed by a transitional dilator. Next, a 0.035 guidewire was advanced into the IVC with a 5-French catheter. Measurements were obtained from the right venotomy site to the proximal right atrium. In the right infraclavicular chest, a subcutaneous tunnel was created under sterile conditions and local anesthesia. 1%  lidocaine with epinephrine was utilized for this. The 19 cm tip to cuff dialysis catheter was tunneled subcutaneously to the venotomy site and inserted into the SVC/RA junction through a valved peel-away sheath. Position was confirmed with fluoroscopy. Images were obtained for documentation. Blood was aspirated from the catheter followed by saline and heparin flushes. The appropriate volume and strength of heparin was instilled in each lumen. Caps were applied. The catheter was secured at the tunnel site with Gelfoam and a pursestring suture. The venotomy site was closed with subcuticular Vicryl suture. Dermabond was applied to the small right neck incision. A dry sterile dressing was applied. The catheter is ready for use. No immediate complications. IMPRESSION: Ultrasound and fluoroscopically guided right internal jugular tunneled hemodialysis catheter (19 cm tip to cuff dialysis catheter). Electronically Signed   By: Jerilynn Mages.  Shick M.D.   On: 05/05/2018 15:25   Ir US Guide Vasc Access Right  Result Date: 05/05/2018 INDICATION: Acute kidney injury, no current access for dialysis EXAM: ULTRASOUND GUIDANCE FOR VASCULAR ACCESS RIGHT INTERNAL JUGULAR PERMANENT HEMODIALYSIS CATHETER Date:  05/05/2018 05/05/2018 3:15 pm Radiologist:  Jerilynn Mages. Daryll Brod, MD Guidance:  Ultrasound fluoroscopic FLUOROSCOPY TIME:  Fluoroscopy Time: 0 minutes 24 seconds (2 mGy). MEDICATIONS: Ancef 2 g  administered within 1 hour of procedure ANESTHESIA/SEDATION: Versed 0.5 mg IV; Fentanyl 12.5 mcg IV; Moderate Sedation Time:  18 minutes The patient was continuously monitored during the procedure by the interventional radiology nurse under my direct supervision. CONTRAST:  None. COMPLICATIONS: None immediate. PROCEDURE: Informed consent was obtained from the patient following explanation of the procedure, risks, benefits and alternatives. The patient understands, agrees and consents for the procedure. All questions were addressed. A time out was performed. Maximal barrier sterile technique utilized including caps, mask, sterile gowns, sterile gloves, large sterile drape, hand hygiene, and 2% chlorhexidine scrub. Under sterile conditions and local anesthesia, right internal jugular micropuncture venous access was performed with ultrasound. Images were obtained for documentation of the patent right internal jugular vein. A guide wire was inserted followed by a transitional dilator. Next, a 0.035 guidewire was advanced into the IVC with a 5-French catheter. Measurements were obtained from the right venotomy site to the proximal right atrium. In the right infraclavicular chest, a subcutaneous tunnel was created under sterile conditions and local anesthesia. 1% lidocaine with epinephrine was utilized for this. The 19 cm tip to cuff dialysis catheter was tunneled subcutaneously to the venotomy site and inserted into the SVC/RA junction through a valved peel-away sheath. Position was confirmed with fluoroscopy. Images were obtained for documentation. Blood was aspirated from the catheter followed by saline and heparin flushes. The appropriate volume and strength of heparin was instilled in each lumen. Caps were applied. The catheter was secured at the tunnel site with Gelfoam and a pursestring suture. The venotomy site was closed with subcuticular Vicryl suture. Dermabond was applied to the small right neck incision. A  dry sterile dressing was applied. The catheter is ready for use. No immediate complications. IMPRESSION: Ultrasound and fluoroscopically guided right internal jugular tunneled hemodialysis catheter (19 cm tip to cuff dialysis catheter). Electronically Signed   By: Jerilynn Mages.  Shick M.D.   On: 05/05/2018 15:25   Dg Chest Port 1 View  Result Date: 05/02/2018 CLINICAL DATA:  Pulmonary edema. EXAM: PORTABLE CHEST 1 VIEW COMPARISON:  05/01/2018. FINDINGS: Endotracheal tube and nasogastric tube have been removed. Right IJ central line tip projects over the SVC. Left IJ central line tip also projects over the SVC. Heart size stable.  Mild diffuse interstitial prominence and indistinctness with improvement in aeration in the lung bases. Small bilateral pleural effusions. IMPRESSION: Improving congestive heart failure. Electronically Signed   By: Lorin Picket M.D.   On: 05/02/2018 07:18   Dg Chest Port 1 View  Result Date: 05/01/2018 CLINICAL DATA:  Respiratory failure. EXAM: PORTABLE CHEST 1 VIEW COMPARISON:  04/30/2018. FINDINGS: Endotracheal tube, NG tube, right IJ line stable position. Stable cardiomegaly persistent bibasilar infiltrates/edema and small bilateral pleural effusions. No interim change. No pneumothorax. IMPRESSION: 1.  Lines and tubes in stable position. 2. Persistent bibasilar infiltrates/edema and small bilateral pleural effusions. No significant interim change. 3.  Stable cardiomegaly. Electronically Signed   By: Marcello Moores  Register   On: 05/01/2018 06:25   Dg Chest Port 1 View  Result Date: 04/30/2018 CLINICAL DATA:  Intubation EXAM: PORTABLE CHEST 1 VIEW COMPARISON:  04/29/2018 FINDINGS: Endotracheal tube in good position. NG tube in the stomach. Central venous catheter tip in the lower SVC unchanged. No pneumothorax Cardiac enlargement. Diffuse bilateral airspace disease has progressed, probable edema. Progression of bibasilar atelectasis and small pleural effusions bilaterally. IMPRESSION: Support  lines remain in good position Worsening bilateral airspace disease most consistent with pulmonary edema. Progression of bibasilar atelectasis and bilateral effusions. Electronically Signed   By: Franchot Gallo M.D.   On: 04/30/2018 07:16   Dg Chest Port 1 View  Result Date: 04/29/2018 CLINICAL DATA:  Hypoxia EXAM: PORTABLE CHEST 1 VIEW COMPARISON:  April 28, 2018 FINDINGS: Endotracheal tube tip is 2.1 cm above the carina. Right central catheter tip is in the superior vena cava. Left central catheter tip is at the junction of the left innominate vein and superior vena cava, stable. Nasogastric tube tip and side port below the diaphragm. No pneumothorax. There is bibasilar atelectasis with small pleural effusion on the right. No consolidation. Heart is mildly enlarged with pulmonary vascularity normal. No adenopathy. No bone lesions. IMPRESSION: Tube and catheter positions as described without pneumothorax. Mild bibasilar atelectasis. Small right pleural effusion. No consolidation. Stable cardiac prominence. Electronically Signed   By: Lowella Grip III M.D.   On: 04/29/2018 07:34   Dg Chest Port 1 View  Result Date: 04/28/2018 CLINICAL DATA:  ETT placement and HD catheter placement EXAM: PORTABLE CHEST 1 VIEW COMPARISON:  04/27/2018. FINDINGS: Cardiomegaly. HD catheter tip distal SVC. ET tube tip 3.3 cm above carina. BILATERAL pulmonary opacities, RIGHT worse than LEFT, consistent with pulmonary edema. Central venous catheter tip unchanged from LEFT IJ approach, proximal SVC. No pneumothorax. IMPRESSION: Cardiomegaly with pulmonary edema may be minimally worse. ET tube and HD catheter appear in satisfactory position. No pneumothorax. Electronically Signed   By: Staci Righter M.D.   On: 04/28/2018 16:57   Dg Chest Port 1 View  Result Date: 04/27/2018 CLINICAL DATA:  Acute respiratory failure EXAM: PORTABLE CHEST 1 VIEW COMPARISON:  04/25/2018 FINDINGS: Bilateral mild interstitial thickening with patchy  alveolar airspace opacities at bilateral lung bases. No pleural effusion or pneumothorax. Stable cardiomediastinal silhouette. Left IJ central venous catheter with the tip projecting over the SVC in unchanged position. No acute osseous abnormality. IMPRESSION: 1. Bilateral interstitial thickening concerning for mild interstitial edema versus interstitial infection. Electronically Signed   By: Kathreen Devoid   On: 04/27/2018 08:37   Dg Chest Port 1 View  Result Date: 04/25/2018 CLINICAL DATA:  Central line placement. EXAM: PORTABLE CHEST 1 VIEW COMPARISON:  Chest radiograph performed 04/24/2018 FINDINGS: The patient's left the IJ line is noted ending about the proximal SVC. Mild vascular  congestion is noted. Mild bibasilar atelectasis is seen. No pleural effusion or pneumothorax is seen, though the lung bases are incompletely characterized. The cardiomediastinal silhouette is borderline normal in size. No acute osseous abnormalities are identified. IMPRESSION: 1. Left IJ line noted ending about the proximal SVC. No pneumothorax. 2. Mild vascular congestion noted; mild bibasilar atelectasis seen. Electronically Signed   By: Garald Balding M.D.   On: 04/25/2018 03:57   Dg Chest Portable 1 View  Result Date: 04/24/2018 CLINICAL DATA:  Initial evaluation for acute sepsis. EXAM: PORTABLE CHEST 1 VIEW COMPARISON:  Prior radiograph from 12/02/2017. FINDINGS: Moderate cardiomegaly, stable. Mediastinal silhouette within normal limits. No focal infiltrates. No pulmonary edema or visible pleural effusion. No pneumothorax. Osseous structures within normal limits. IMPRESSION: 1. No active cardiopulmonary disease. 2. Stable cardiomegaly without pulmonary edema. Electronically Signed   By: Jeannine Boga M.D.   On: 04/24/2018 22:19     CBC Recent Labs  Lab 05/03/18 0427  05/06/18 0345 05/07/18 0418 05/07/18 1012 05/08/18 0418 05/09/18 0641  WBC 17.3*   < > 14.0* 13.1* 13.4* 13.2* 13.0*  HGB 8.2*   < > 7.7*  7.0* 6.9* 7.9* 9.0*  HCT 27.0*   < > 25.8* 23.1* 23.1* 26.5* 30.0*  PLT 262   < > 265 225 230 176 211  MCV 80.6   < > 82.4 81.6 82.2 83.1 84.5  MCH 24.5*   < > 24.6* 24.7* 24.6* 24.8* 25.4*  MCHC 30.4   < > 29.8* 30.3 29.9* 29.8* 30.0  RDW 18.2*   < > 18.7* 18.5* 18.6* 18.1* 18.9*  LYMPHSABS 2.1  --   --   --   --   --   --   MONOABS 0.9  --   --   --   --   --   --   EOSABS 0.6  --   --   --   --   --   --   BASOSABS 0.1  --   --   --   --   --   --    < > = values in this interval not displayed.    Chemistries  Recent Labs  Lab 05/03/18 0427  05/06/18 0345 05/07/18 0418 05/08/18 0418 05/09/18 0641 05/09/18 0743  NA 135   < > 138  137 136 138 137 137  K 3.8   < > 4.3  4.3 3.6 3.8 3.9 3.8  CL 100*   < > 102  102 102 104 102 102  CO2 23   < > 26  25 26 27 25 27   GLUCOSE 99   < > 191*  189* 113* 126* 110* 112*  BUN 31*   < > 52*  53* 32* 16 25* 25*  CREATININE 5.12*   < > 6.65*  6.58* 4.69* 3.23* 3.86* 3.65*  CALCIUM 8.0*   < > 8.4*  8.4* 7.8* 8.2* 8.6* 8.4*  MG  --   --   --  1.6* 1.8 1.7  --   AST 14*  --  11*  --   --   --   --   ALT 7*  --  10*  --   --   --   --   ALKPHOS 103  --  88  --   --   --   --   BILITOT 0.4  --  0.2*  --   --   --   --    < > =  values in this interval not displayed.   ------------------------------------------------------------------------------------------------------------------ No results for input(s): CHOL, HDL, LDLCALC, TRIG, CHOLHDL, LDLDIRECT in the last 72 hours.  Lab Results  Component Value Date   HGBA1C 8.4 (H) 03/24/2018   ------------------------------------------------------------------------------------------------------------------ Recent Labs    05/09/18 0641  TSH 2.708   ------------------------------------------------------------------------------------------------------------------ Recent Labs    05/06/18 2320  VITAMINB12 418  FOLATE 6.1  FERRITIN 234  TIBC 214*  IRON 29  RETICCTPCT 1.8     Coagulation profile Recent Labs  Lab 05/03/18 1418  INR 1.28    No results for input(s): DDIMER in the last 72 hours.  Cardiac Enzymes Recent Labs  Lab 05/02/18 2326 05/03/18 0427 05/03/18 1108  TROPONINI 0.36* 0.35* 0.30*   ------------------------------------------------------------------------------------------------------------------ No results found for: BNP   Roxan Hockey M.D on 05/09/2018 at 3:53 PM  Between 7am to 7pm - Pager - [phone]  After 7pm go to [email or web address] - password [email or web address]  [name] -  Office  [phone]   Voice Recognition Viviann Spare dictation system was used to create this note, attempts have been made to correct errors. Please contact the author with questions and/or clarifications.

## 2018-05-09 NOTE — Evaluation (Addendum)
Occupational Therapy Evaluation Patient Details Name: [name] MRN: [id number] DOB: 1966-10-07 Today's Date: 05/09/2018    History of Present Illness 52 year old female with PMH of DM, PAD, A.Fib on Xarelto, Chronic Wounds of Left Foot with H/O of ABF and Fem-Pop Bypass. Recent Admission 5/10-5/14 for redo of Left femoral to PTA bypass with cryopreserved vein. Post-Op patient lost pulse to left foot and was taken back to OR for thrombectomy. On 6/3 patient went to outpatient vascular appointment where she was found to by hypotensive and febrile with a occlusion noted to bypass graft. 6/7 patient presented to ED with AMS with pain to left lower leg and emesis and diarrhea. 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  Course has been complicated by AKI and NSTEMI.  Now requiring intermittent HD. ETT 6/11-6/14.    Clinical Impression   Per chart review chart review and pt report, pt was at home with her husband and was performing ADLs and using RW for functional mobility. Pt poor historian and with difficulty provide baseline information stating "I don't know how this started and I can't remember why I even came to the hospital." Pt currently requiring Min A for UB ADLs, Max A for LB ADLs, and Max A +2 for sit<>stand. Pt demonstrating decreased cognition, balance, strength, and activity tolerance. Pt will require further acute OT to increase occupational performances and facilitate safe dc. Recommend dc to SNF for further OT to optimize safety, independence with ADLs, and return to PLOF.      Follow Up Recommendations  SNF    Equipment Recommendations  Other (comment)(Defer to next venue)    Recommendations for Other Services PT consult     Precautions / Restrictions Precautions Precautions: Fall Precaution Comments: telemetry Restrictions Weight Bearing Restrictions: Yes Other Position/Activity Restrictions: NWB LLE at residual limb      Mobility Bed Mobility Overal bed mobility:  Needs Assistance Bed Mobility: Supine to Sit;Sit to Supine     Supine to sit: Mod assist;+2 for physical assistance;HOB elevated Sit to supine: Mod assist;+2 for physical assistance   General bed mobility comments: Mod A to bring hips towards EOB and elevate trunk. Pt using bed rails to pull towards EOB. Requiring Mod A to manage [name] over EOB in returning to supine  Transfers Overall transfer level: Needs assistance Equipment used: 2 person hand held assist Transfers: Sit to/from Stand Sit to Stand: Max assist;+2 physical assistance;From elevated surface         General transfer comment: Max A +2 for sit<>stand x2 with right knee blocked    Balance Overall balance assessment: Needs assistance Sitting-balance support: Feet supported;Single extremity supported Sitting balance-Leahy Scale: Fair Sitting balance - Comments: Able to maintain static standing.   Standing balance support: Bilateral upper extremity supported;During functional activity Standing balance-Leahy Scale: Zero Standing balance comment: Unable to maintain standing                           ADL either performed or assessed with clinical judgement   ADL Overall ADL's : Needs assistance/impaired Eating/Feeding: Set up;Bed level   Grooming: Sitting;Min guard;Wash/dry face Grooming Details (indicate cue type and reason): Pt performing grooming tasks at EOB with MIn Guard A. Upper Body Bathing: Minimal assistance;Sitting   Lower Body Bathing: Maximal assistance;Sitting/lateral leans;Bed level   Upper Body Dressing : Minimal assistance;Sitting   Lower Body Dressing: Maximal assistance;Sitting/lateral leans;Bed level  Functional mobility during ADLs: Maximal assistance;+2 for physical assistance(sit<>Stand only) General ADL Comments: Pt performing bed mobility, grooming at EOB, and sit<>stand x2. Pt limited by poor cognition, strength, and balance as well as pain     Vision  Baseline Vision/History: Legally blind Patient Visual Report: Other (comment)(Legally blind in right eye)       Perception     Praxis      Pertinent Vitals/Pain Pain Assessment: Faces Faces Pain Scale: Hurts whole lot Pain Location: L leg Pain Descriptors / Indicators: Headache;Grimacing Pain Intervention(s): Limited activity within patient's tolerance;Monitored during session;Repositioned     Hand Dominance Right   Extremity/Trunk Assessment Upper Extremity Assessment Upper Extremity Assessment: Overall WFL for tasks assessed   Lower Extremity Assessment Lower Extremity Assessment: Defer to PT evaluation;LLE deficits/detail LLE Deficits / Details: s/p left AKA on 04/26/18.    Cervical / Trunk Assessment Cervical / Trunk Assessment: Other exceptions Cervical / Trunk Exceptions: Increased body habitus   Communication Communication Communication: No difficulties   Cognition Arousal/Alertness: Awake/alert Behavior During Therapy: WFL for tasks assessed/performed Overall Cognitive Status: No family/caregiver present to determine baseline cognitive functioning                                 General Comments: Pt confused on hospital stay time line. Unable to provide accurate description of events and states "I don't know has happened to me. I dont remeber when they took my legs. I dont remeber why all this started."   General Comments  Educating pt on sensory stimulation techniques for left residual limb to reduce phantom pain    Exercises     Shoulder Instructions      Home Living Family/patient expects to be discharged to:: Skilled nursing facility                                        Prior Functioning/Environment Level of Independence: Independent with assistive device(s)        Comments: Pt reporting she was using a rollator PTA. Reports she performed ADLs.        OT Problem List: Decreased strength;Decreased range of  motion;Decreased activity tolerance;Impaired balance (sitting and/or standing);Impaired vision/perception;Decreased cognition;Decreased safety awareness;Decreased knowledge of use of DME or AE;Decreased knowledge of precautions;Pain      OT Treatment/Interventions: Self-care/ADL training;Therapeutic exercise;Energy conservation;DME and/or AE instruction;Therapeutic activities;Patient/family education    OT Goals(Current goals can be found in the care plan section) Acute Rehab OT Goals Patient Stated Goal: to get up and stand OT Goal Formulation: With patient Time For Goal Achievement: 05/23/18 Potential to Achieve Goals: Good ADL Goals Pt Will Perform Upper Body Dressing: with set-up;with supervision;sitting Pt Will Perform Lower Body Dressing: sitting/lateral leans;bed level;with min guard assist Pt Will Transfer to Toilet: with min assist;with transfer board;bedside commode Pt Will Perform Toileting - Clothing Manipulation and hygiene: with min guard assist;sitting/lateral leans;bed level Additional ADL Goal #1: Pt will perform bed mobility with supervision in preparation for ADLs  OT Frequency: Min 2X/week   Barriers to D/C:            Co-evaluation              AM-PAC PT "6 Clicks" Daily Activity     Outcome Measure Help from another person eating meals?: A Little Help from another person taking care of personal grooming?:  A Little Help from another person toileting, which includes using toliet, bedpan, or urinal?: A Lot Help from another person bathing (including washing, rinsing, drying)?: A Lot Help from another person to put on and taking off regular upper body clothing?: A Little Help from another person to put on and taking off regular lower body clothing?: A Lot 6 Click Score: 15   End of Session Equipment Utilized During Treatment: Gait belt Nurse Communication: Mobility status;Weight bearing status  Activity Tolerance: Patient limited by fatigue;Patient limited  by pain Patient left: in bed;with call bell/phone within reach;with bed alarm set;with SCD's reapplied  OT Visit Diagnosis: Unsteadiness on feet (R26.81);Other abnormalities of gait and mobility (R26.89);Muscle weakness (generalized) (M62.81);Pain;Other symptoms and signs involving cognitive function Pain - Right/Left: Left Pain - part of body: Leg                Time: 0254-2706 OT Time Calculation (min): 31 min Charges:  OT General Charges $OT Visit: 1 Visit OT Evaluation $OT Eval Moderate Complexity: 1 Mod OT Treatments $Self Care/Home Management : 8-22 mins G-Codes:     Shaunn Tackitt MSOT, OTR/L Acute Rehab Pager: [phone] Office: [place] 05/09/2018, 5:15 PM

## 2018-05-09 NOTE — Progress Notes (Signed)
[facility] Progress Note    Assessment/ Plan:   1. Anuric AKI from ATN, normal baseline Scr.  Started HD 04/29/18, s/p TDC with IR 6/19, pulled out temp cath. HD 6/19, 6/20, 6/22.  Rate of rise decreasing between treatments- hopefully will have meaningful recovery, if it is it's nascent.  Continue to closely follow  2. PAD, s/p L AKA 6/9 for ischemic gangrenous LLE ([name])  3. Septic shock, resolved, s/p antibiotics, stress dose steroids, pressors  4. NSTEMI, cardiology following new depressed LVEF and RWMA on 6/11 TTE; needs LHC--> have discussed with cardiology, holding off on cath for now until potential for renal recovery more clearly elucidated and bleeding issues sorted out  5. Hx/o AFib; RVR, off hep gtt due to hemoccult + stools  6. Anemia: likely of blood loss and renal failure- s/p 1 u pRBCs, Aranesp 6/20.  7. Dispo: pending assessment of renal recovery and cardiac workup  Subjective:    Nauseated this AM "protein drink" making her sick.       Objective:   BP (!) 108/53 (BP Location: Right Arm)   Pulse 87   Temp 98.4 F (36.9 C) (Oral)   Resp 20   Ht 5\' 7"  (1.702 m)   Wt 91.1 kg (200 lb 13.4 oz)   SpO2 94%   BMI 31.46 kg/m   Intake/Output Summary (Last 24 hours) at 05/09/2018 0940 Last data filed at 05/09/2018 0600 Gross per 24 hour  Intake 600 ml  Output 0 ml  Net 600 ml   Weight change: -1.8 kg (-3 lb 15.5 oz)  Physical Exam: HMC:[id number] ill-appearing, lying in bed, emesis bag at bedside QHU:TMLY, poor dentition with mult caries CV:RRR, no rub, normal S1 and S2 CHEST: R IJ TDC in place PULM:CTAB anteriorly YTK:PTWS, nontender FKC:LEXNT lower extremity without edema, LLE s/p AKA with staple line c/d/i  Imaging: No results found.  Labs: BMET Recent Labs  Lab 05/04/18 0445 05/05/18 0524 05/06/18 0345 05/07/18 0418 05/08/18 0418 05/09/18 0641 05/09/18 0743  NA 137 136 138  137 136 138 137 137  K 4.6 4.5 4.3   4.3 3.6 3.8 3.9 3.8  CL 100* 100* 102  102 102 104 102 102  CO2 25 26 26  25 26 27 25 27   GLUCOSE 195* 189* 191*  189* 113* 126* 110* 112*  BUN 36* 44* 52*  53* 32* 16 25* 25*  CREATININE 5.65* 6.09* 6.65*  6.58* 4.69* 3.23* 3.86* 3.65*  CALCIUM 8.6* 8.7* 8.4*  8.4* 7.8* 8.2* 8.6* 8.4*  PHOS  --   --  7.8* 4.8*  --   --  5.2*   CBC Recent Labs  Lab 05/03/18 0427  05/07/18 0418 05/07/18 1012 05/08/18 0418 05/09/18 0641  WBC 17.3*   < > 13.1* 13.4* 13.2* 13.0*  NEUTROABS 13.1*  --   --   --   --   --   HGB 8.2*   < > 7.0* 6.9* 7.9* 9.0*  HCT 27.0*   < > 23.1* 23.1* 26.5* 30.0*  MCV 80.6   < > 81.6 82.2 83.1 84.5  PLT 262   < > 225 230 176 211   < > = values in this interval not displayed.    Medications:    . sodium chloride   Intravenous Once  . amiodarone  400 mg Oral Daily  . aspirin  81 mg Oral Daily  . chlorhexidine  15 mL Mouth Rinse BID  . Chlorhexidine Gluconate Cloth  6  each Topical [id number]  . darbepoetin (ARANESP) injection - DIALYSIS  100 mcg Intravenous Q Thu-HD  . docusate sodium  100 mg Oral BID  . feeding supplement (PRO-STAT SUGAR FREE 64)  30 mL Oral TID BM  . ferrous sulfate  325 mg Oral BID WC  . heparin injection (subcutaneous)  5,000 Units Subcutaneous Q8H  . insulin aspart  0-5 Units Subcutaneous QHS  . insulin aspart  0-9 Units Subcutaneous TID WC  . mouth rinse  15 mL Mouth Rinse q12n4p  . multivitamin  1 tablet Oral QHS  . pantoprazole  40 mg Oral BID AC  . rosuvastatin  20 mg Oral q1800  . sodium chloride flush  10-40 mL Intracatheter Q12H      Madelon Lips MD 05/09/2018, 9:40 AM

## 2018-05-09 NOTE — Progress Notes (Signed)
Progress Note  Patient Name: [name] Date of Encounter: 05/09/2018  Primary Cardiologist: Nelva Bush, MD   Subjective   No cardiovascular complaints.  Only confused.  Remains anuric.  Burden of atrial fibrillation is substantially lower after starting amiodarone.  Does not have dyspnea despite volume overload.  Weight is down 17 pounds from peak during this admission, but is still roughly 15-20 pounds above baseline  Inpatient Medications    Scheduled Meds: . sodium chloride   Intravenous Once  . amiodarone  400 mg Oral Daily  . aspirin  81 mg Oral Daily  . chlorhexidine  15 mL Mouth Rinse BID  . Chlorhexidine Gluconate Cloth  6 each Topical Q0600  . darbepoetin (ARANESP) injection - DIALYSIS  100 mcg Intravenous Q Thu-HD  . diphenoxylate-atropine  1 tablet Oral TID  . docusate sodium  100 mg Oral BID  . feeding supplement (PRO-STAT SUGAR FREE 64)  30 mL Oral TID BM  . ferrous sulfate  325 mg Oral BID WC  . heparin injection (subcutaneous)  5,000 Units Subcutaneous Q8H  . insulin aspart  0-5 Units Subcutaneous QHS  . insulin aspart  0-9 Units Subcutaneous TID WC  . mouth rinse  15 mL Mouth Rinse q12n4p  . multivitamin  1 tablet Oral QHS  . pantoprazole  40 mg Oral BID AC  . rosuvastatin  20 mg Oral q1800  . sodium chloride flush  10-40 mL Intracatheter Q12H   Continuous Infusions: . sodium chloride 10 mL/hr at 05/08/18 0027   PRN Meds: acetaminophen, alteplase, fentaNYL (SUBLIMAZE) injection, heparin, metoprolol tartrate, nitroGLYCERIN, ondansetron (ZOFRAN) IV, oxyCODONE, phenol, sodium chloride flush   Vital Signs    Vitals:   05/09/18 0945 05/09/18 1015 05/09/18 1045 05/09/18 1115  BP: 121/63 116/69 118/69 115/62  Pulse: 89 94 86 85  Resp:    17  Temp:    (!) 97.4 F (36.3 C)  TempSrc:    Oral  SpO2:    96%  Weight:    192 lb 10.9 oz (87.4 kg)  Height:        Intake/Output Summary (Last 24 hours) at 05/09/2018 1420 Last data filed at 05/09/2018  1115 Gross per 24 hour  Intake 600 ml  Output 1500 ml  Net -900 ml   Filed Weights   05/09/18 0436 05/09/18 0710 05/09/18 1115  Weight: 200 lb 13.4 oz (91.1 kg) 195 lb 12.3 oz (88.8 kg) 192 lb 10.9 oz (87.4 kg)    Telemetry    Sinus rhythm with very frequent PVCs and PACs- Personally Reviewed  ECG    No new tracing- Personally Reviewed  Physical Exam  Appears comfortable GEN: No acute distress.   Neck:  8 cm JVD Cardiac: RRR, no murmurs, rubs, or gallops.  Respiratory: Clear to auscultation bilaterally. GI: Soft, nontender, non-distended  MS: No edema; status post right BKA Neuro:  Nonfocal  Psych: Normal affect   Labs    Chemistry Recent Labs  Lab 05/03/18 0427  05/06/18 0345  05/08/18 0418 05/09/18 0641 05/09/18 0743  NA 135   < > 138  137   < > 138 137 137  K 3.8   < > 4.3  4.3   < > 3.8 3.9 3.8  CL 100*   < > 102  102   < > 104 102 102  CO2 23   < > 26  25   < > 27 25 27   GLUCOSE 99   < > 191*  189*   < >  126* 110* 112*  BUN 31*   < > 52*  53*   < > 16 25* 25*  CREATININE 5.12*   < > 6.65*  6.58*   < > 3.23* 3.86* 3.65*  CALCIUM 8.0*   < > 8.4*  8.4*   < > 8.2* 8.6* 8.4*  PROT 5.4*  --  5.4*  --   --   --   --   ALBUMIN 1.5*  --  1.9*  1.9*  --   --   --  1.9*  AST 14*  --  11*  --   --   --   --   ALT 7*  --  10*  --   --   --   --   ALKPHOS 103  --  88  --   --   --   --   BILITOT 0.4  --  0.2*  --   --   --   --   GFRNONAA 9*   < > 6*  6*   < > 15* 12* 13*  GFRAA 10*   < > 7*  7*   < > 18* 14* 15*  ANIONGAP 12   < > 10  10   < > 7 10 8    < > = values in this interval not displayed.     Hematology Recent Labs  Lab 05/07/18 1012 05/08/18 0418 05/09/18 0641  WBC 13.4* 13.2* 13.0*  RBC 2.81* 3.19* 3.55*  HGB 6.9* 7.9* 9.0*  HCT 23.1* 26.5* 30.0*  MCV 82.2 83.1 84.5  MCH 24.6* 24.8* 25.4*  MCHC 29.9* 29.8* 30.0  RDW 18.6* 18.1* 18.9*  PLT 230 176 211    Cardiac Enzymes Recent Labs  Lab 05/02/18 2326 05/03/18 0427  05/03/18 1108  TROPONINI 0.36* 0.35* 0.30*   No results for input(s): TROPIPOC in the last 168 hours.   BNPNo results for input(s): BNP, PROBNP in the last 168 hours.   DDimer No results for input(s): DDIMER in the last 168 hours.   Radiology    No results found.  Cardiac Studies   2D echo 04/28/18 Study Conclusions - Left ventricle: The cavity size was normal. There was mild concentric hypertrophy. Systolic function was moderately to severely reduced. The estimated ejection fraction was in the range of 30% to 35%. Features are consistent with a pseudonormal left ventricular filling pattern, with concomitant abnormal relaxation and increased filling pressure (grade 2 diastolic dysfunction). Doppler parameters are consistent with elevated ventricular end-diastolic filling pressure. - Mitral valve: Calcified annulus. Moderately thickened, moderately calcified leaflets . There was mild regurgitation. - Left atrium: The atrium was mildly dilated. - Right ventricle: The cavity size was moderately dilated. Wall thickness was normal. Systolic function was moderately reduced. - Tricuspid valve: There was mild regurgitation. - Pulmonary arteries: Systolic pressure was mildly increased. PA peak pressure: 34 mm Hg (S). - Pericardium, extracardiac: A trivial pericardial effusion was identified. Features were not consistent with tamponade physiology Impressions: - When compared to the prior study from 04/17/2017 LVEF has decreased from 55-60% to 30-35% with new wall motion abnormalities - diffuse hypokinesis and akinesis of the basal and mid anteroseptal, anterior and apical septal and anterior walls. RVEF is moderately decreased.  Patient Profile     52 y.o. female with extensive PAD, DM, paroxysmal atrial fib on Xarelto, coronary calcification on CT, chronic back pain, admitted with septic shock 04/24/2018 due to LLE gangrene with multiorgan  dysfunction, small demand NSTEMI (troponin peak 7.7)  and new LV dysfunction with drop in EF to 30-35% with new WMA. 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 Shock resolved, but has acute oliguric renal failure requiring dialysis. Also found to have recurrent paroxysmal atrial fib during admission, amiodarone started due to low BP limiting use of AV node blocking agents. Heparin stopped due to anemia and suspected GI bleeding.  Assessment & Plan    1. ARF-  on HD , remains oliguric.  2. CHF/ischemic cardiomyopathy -BP prohibits med titration at present time. No ACEI/ARB/spiro at present time given renal failure. Volume currently begin managed with HD, substantial improvement, but still above pre-admission weight probably 20 lb (need to take amputation into account as well).  3. NSTEMI -suspected demand ischemia due to septic shock with high level of suspicion for underlying CAD. Plan cardiac cath for evaluation of coronaries once renal function improves or she is clearly ESRD and when we are confident she is not actively bleeding.  4. PAD: s/p R BKA  5.  Acute on chronic normocytic anemia - combination of chronic illness, renal failure and possible GI bleeding (heme+ black stools afew days ago)  6. Paroxysmal atrial fib- good response to amio. QT a little long already at baseline. Recheck ECG in AM.    For questions or updates, please contact [place] Please consult [email or web address] for contact info under Cardiology/STEMI.      Signed, Sanda Klein, MD  05/09/2018, 2:20 PM

## 2018-05-10 LAB — MAGNESIUM: MAGNESIUM: 1.7 mg/dL (ref 1.7–2.4)

## 2018-05-10 LAB — GLUCOSE, CAPILLARY
GLUCOSE-CAPILLARY: 158 mg/dL — AB (ref 65–99)
Glucose-Capillary: 112 mg/dL — ABNORMAL HIGH (ref 65–99)
Glucose-Capillary: 113 mg/dL — ABNORMAL HIGH (ref 65–99)
Glucose-Capillary: 136 mg/dL — ABNORMAL HIGH (ref 65–99)

## 2018-05-10 LAB — BASIC METABOLIC PANEL
Anion gap: 5 (ref 5–15)
BUN: 16 mg/dL (ref 6–20)
CALCIUM: 8.1 mg/dL — AB (ref 8.9–10.3)
CO2: 30 mmol/L (ref 22–32)
CREATININE: 3.13 mg/dL — AB (ref 0.44–1.00)
Chloride: 103 mmol/L (ref 101–111)
GFR calc Af Amer: 18 mL/min — ABNORMAL LOW (ref >60)
GFR, EST NON AFRICAN AMERICAN: 16 mL/min — AB (ref >60)
GLUCOSE: 148 mg/dL — AB (ref 65–99)
POTASSIUM: 4 mmol/L (ref 3.5–5.1)
SODIUM: 138 mmol/L (ref 135–145)

## 2018-05-10 LAB — CBC
HCT: 31.1 % — ABNORMAL LOW (ref 36.0–46.0)
Hemoglobin: 9.3 g/dL — ABNORMAL LOW (ref 12.0–15.0)
MCH: 25.4 pg — ABNORMAL LOW (ref 26.0–34.0)
MCHC: 29.9 g/dL — AB (ref 30.0–36.0)
MCV: 85 fL (ref 78.0–100.0)
Platelets: 213 10*3/uL (ref 150–400)
RBC: 3.66 MIL/uL — ABNORMAL LOW (ref 3.87–5.11)
RDW: 19.8 % — AB (ref 11.5–15.5)
WBC: 12.6 10*3/uL — ABNORMAL HIGH (ref 4.0–10.5)

## 2018-05-10 MED ORDER — DIPHENOXYLATE-ATROPINE 2.5-0.025 MG PO TABS
2.0000 | ORAL_TABLET | Freq: Four times a day (QID) | ORAL | Status: DC
  Administered 2018-05-10 – 2018-05-22 (×46): 2 via ORAL
  Filled 2018-05-10 (×46): qty 2

## 2018-05-10 NOTE — Progress Notes (Signed)
Patient Demographics:    [name], is a 52 y.o. female, DOB - 05/23/1966, WGN:[id number]  Admit date - 04/24/2018   Admitting Physician [name], MD  Outpatient Primary MD for the patient is [name], MD  LOS - 15   Chief Complaint  Patient presents with  . 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)   PAD (peripheral artery disease) (HCC)   Pressure injury of skin   DM2 (diabetes mellitus, type 2) (HCC)   Acute kidney injury ([place])   Buttock wound, left, subsequent encounter   Paroxysmal atrial fibrillation with rapid ventricular response (HCC)   Lower limb ischemia   Microcytic anemia   Gangrene of lower extremity (HCC)   Acute renal failure (HCC)   Acute respiratory failure (HCC)   Sepsis (HCC)   Acute encephalopathy   Acute combined systolic and diastolic heart failure (HCC)   Non-ST elevation (NSTEMI) myocardial infarction [facility])   Brief Narrative:  52 y/o WF  PMHx  Depression, Diabetes mellitus type II uncontrolled with complication, Diabetic neuropathy ,  Femoral-popliteal bypass graft occlusion, 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).She also had a NSTEMI with new cardiomyopathy.   Plan:- 1)Acute on chronic Anemia--stable at this time, hemoglobin up to 9.3 from 6.9 after transfusion of 1 unit of packed cells on 05/07/2018, and Aranesp shot x1 on 05/07/18,  Hemoccult positive, heparin drip has been discontinued by cardiology  team, given CAD try to keep hemoglobin at least close to 9, c/n  Aranesp per nephrology team  2)NSTEMI--suspect due to demand ischemia in the setting of septic shock, clinically high index of suspicion for underlying CAD-cardiology input appreciated, IV heparin drip discontinued after 48 hours per cardiology team, cardiology team will consider low-dose beta-blocker if blood pressure allows, at this time patient unable to tolerate ACE/ARB/Aldactone due to renal concerns, c/n aspirin and crestor  3)AKI/ATN-renal failure in the setting of sepsis with multiorgan failure and persistent hypotension requiring pressors, no significant renal recovery, limited  urine output, hemodialysis was started on 04/29/2018, last hemodialysis session on 05/09/2018,  4)PAFib--- this is not new, unable to anticoagulate due to #1 above, rate controlled better with amiodarone as ordered by cardiology team  5)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 by Dr [name]  6)HFrEF--patient with combined systolic and diastolic dysfunction CHF, echo with EF of 35% with grade 2 diastolic dysfunction, at this time patient unable to tolerate ACE/ARB/Aldactone due to renal concerns, continue to use hemodialysis to address volume status, weight is down more than 17 pounds since admission  7)Developmental delay/cognitive deficits--- baseline patient has cognitive deficits which have worsened since acute infection  8)DM2-poor control, A1c 8.4, Use Novolog/Humalog Sliding scale insulin with Accu-Cheks/Fingersticks as ordered  9)Diarrhea--patient reports long-standing history of intermittent diarrhea,  C. difficile testing previously negative, diarrhea persist, increase Lomotil to 2 tabs 4 times daily   Code Status : Full  Disposition Plan  : TBD, need to resolve hemodialysis issues first  Consults  :  PCCM/cardiology  DVT Prophylaxis  :   Heparin [place]  Lab Results  Component Value Date   PLT 213 05/10/2018    Inpatient  Medications  Scheduled Meds: . sodium chloride   Intravenous Once  . amiodarone  400 mg Oral Daily  . aspirin  81 mg Oral Daily  . chlorhexidine  15 mL Mouth Rinse BID  . Chlorhexidine Gluconate Cloth  6 each Topical Q0600  . darbepoetin (ARANESP) injection - DIALYSIS  100 mcg Intravenous Q Thu-HD  . diphenoxylate-atropine  2 tablet Oral QID  . feeding supplement (PRO-STAT SUGAR FREE 64)  30 mL Oral TID BM  . ferrous sulfate  325 mg Oral BID WC  . heparin injection (subcutaneous)  5,000 Units Subcutaneous Q8H  . insulin aspart  0-5 Units Subcutaneous QHS  . insulin aspart  0-9 Units Subcutaneous TID WC  . mouth rinse  15 mL Mouth Rinse q12n4p  . multivitamin  1 tablet Oral QHS  . pantoprazole  40 mg Oral BID AC  . rosuvastatin  20 mg Oral q1800  . sodium chloride flush  10-40 mL Intracatheter Q12H   Continuous Infusions: . sodium chloride 10 mL/hr at 05/08/18 0027   PRN Meds:.acetaminophen, alteplase, fentaNYL (SUBLIMAZE) injection, heparin, metoprolol tartrate, nitroGLYCERIN, ondansetron (ZOFRAN) IV, oxyCODONE, phenol, sodium chloride flush    Anti-infectives (From admission, onward)   Start     Dose/Rate Route Frequency Ordered Stop   05/05/18 1354  ceFAZolin (ANCEF) 2-4 GM/100ML-% IVPB  Status:  Discontinued    Note to Pharmacy:  Gar Ponto   : cabinet override      05/05/18 1354 05/05/18 1451   05/05/18 0930  ceFAZolin (ANCEF) IVPB 2g/100 mL premix     2 g 200 mL/hr over 30 Minutes Intravenous On call 05/05/18 0924 05/06/18 0930   04/30/18 0900  vancomycin (VANCOCIN) IVPB 750 mg/150 ml premix  Status:  Discontinued     750 mg 150 mL/hr over 60 Minutes Intravenous  Once 04/30/18 0819 04/30/18 0828   04/28/18 1400  cefTAZidime (FORTAZ) 1 g in sodium chloride 0.9 % 100 mL IVPB     1 g 200 mL/hr over 30 Minutes Intravenous Every 24 hours 04/28/18 1301 05/01/18 1416   04/26/18 0600  vancomycin (VANCOCIN) IVPB 1000 mg/200 mL premix  Status:  Discontinued     1,000 mg 200  mL/hr over 60 Minutes Intravenous Every 48 hours 04/26/18 0515 04/28/18 1152   04/25/18 1000  vancomycin (VANCOCIN) IVPB 1000 mg/200 mL premix  Status:  Discontinued     1,000 mg 200 mL/hr over 60 Minutes Intravenous Every 12 hours 04/24/18 2213 04/25/18 0215   04/25/18 0600  piperacillin-tazobactam (ZOSYN) IVPB 3.375 g  Status:  Discontinued     3.375 g 12.5 mL/hr over 240 Minutes Intravenous Every 8 hours 04/24/18 2213 04/25/18 0220   04/25/18 0600  piperacillin-tazobactam (ZOSYN) IVPB 2.25 g  Status:  Discontinued     2.25 g 100 mL/hr over 30 Minutes Intravenous Every 8 hours 04/25/18 0220 04/28/18 1152   04/24/18 2100  piperacillin-tazobactam (ZOSYN) IVPB 3.375 g     3.375 g 100 mL/hr over 30 Minutes Intravenous  Once 04/24/18 2050 04/24/18 2242   04/24/18 2100  vancomycin (VANCOCIN) IVPB 1000 mg/200 mL premix     1,000 mg  200 mL/hr over 60 Minutes Intravenous  Once 04/24/18 2050 04/24/18 2352        Objective:   Vitals:   05/09/18 1400 05/10/18 0107 05/10/18 0539 05/10/18 1525  BP: (!) 90/58 121/68 122/70 (!) 91/52  Pulse: 95 79 81 71  Resp: 20 20 20 18   Temp: (!) 97.3 F (36.3 C) 98 F (36.7 C) 99 F (37.2 C) 98.4 F (36.9 C)  TempSrc: Oral Oral Oral Oral  SpO2: 98% 99% 97% 97%  Weight:   87.2 kg (192 lb 3.2 oz)   Height:        Wt Readings from Last 3 Encounters:  05/10/18 87.2 kg (192 lb 3.2 oz)  04/20/18 72.6 kg (160 lb)  03/31/18 79.4 kg (175 lb)     Intake/Output Summary (Last 24 hours) at 05/10/2018 1825 Last data filed at 05/10/2018 0000 Gross per 24 hour  Intake 240 ml  Output 200 ml  Net 40 ml     Physical Exam  Gen:- Awake Alert,  In no apparent distress  HEENT:- [place].AT, No sclera icterus Neck-Supple Neck,No JVD,  Rt IJ HD catheter.  Lungs-  CTAB , good air movement  CV- S1, S2 normal Abd-  +ve B.Sounds, Abd Soft, No tenderness,    Extremity/Skin:- LT AKA intact suture/staple line Psych-affect is appropriate, oriented x3, poor judgment and  cognitive concerns persist Neuro-no new focal deficits, no tremors   Data Review:   Micro Results No results found for this or any previous visit (from the past 240 hour(s)).  Radiology Reports US Renal  Result Date: 04/25/2018 CLINICAL DATA:  Initial evaluation for acute renal injury. EXAM: RENAL / URINARY TRACT ULTRASOUND COMPLETE COMPARISON:  Prior CT from 11/30/2017 FINDINGS: Right Kidney: Length: 14.4 cm. Echogenicity within normal limits. No mass or hydronephrosis visualized. Left Kidney: Length: 13.7 cm. Echogenicity within normal limits. No mass or hydronephrosis visualized. Bladder: Bladder decompressed with a Foley catheter in place. IMPRESSION: 1. Negative renal ultrasound.  No hydronephrosis. 2. Bladder decompressed with a Foley catheter in place. Electronically Signed   By: Jeannine Boga M.D.   On: 04/25/2018 06:55   Ir Fluoro Guide Cv Line Right  Result Date: 05/05/2018 INDICATION: Acute kidney injury, no current access for dialysis EXAM: ULTRASOUND GUIDANCE FOR VASCULAR ACCESS RIGHT INTERNAL JUGULAR PERMANENT HEMODIALYSIS CATHETER Date:  05/05/2018 05/05/2018 3:15 pm Radiologist:  Jerilynn Mages. Daryll Brod, MD Guidance:  Ultrasound fluoroscopic FLUOROSCOPY TIME:  Fluoroscopy Time: 0 minutes 24 seconds (2 mGy). MEDICATIONS: Ancef 2 g administered within 1 hour of procedure ANESTHESIA/SEDATION: Versed 0.5 mg IV; Fentanyl 12.5 mcg IV; Moderate Sedation Time:  18 minutes The patient was continuously monitored during the procedure by the interventional radiology nurse under my direct supervision. CONTRAST:  None. COMPLICATIONS: None immediate. PROCEDURE: Informed consent was obtained from the patient following explanation of the procedure, risks, benefits and alternatives. The patient understands, agrees and consents for the procedure. All questions were addressed. A time out was performed. Maximal barrier sterile technique utilized including caps, mask, sterile gowns, sterile gloves, large  sterile drape, hand hygiene, and 2% chlorhexidine scrub. Under sterile conditions and local anesthesia, right internal jugular micropuncture venous access was performed with ultrasound. Images were obtained for documentation of the patent right internal jugular vein. A guide wire was inserted followed by a transitional dilator. Next, a 0.035 guidewire was advanced into the IVC with a 5-French catheter. Measurements were obtained from the right venotomy site to the proximal right atrium. In the right infraclavicular chest, a subcutaneous tunnel  was created under sterile conditions and local anesthesia. 1% lidocaine with epinephrine was utilized for this. The 19 cm tip to cuff dialysis catheter was tunneled subcutaneously to the venotomy site and inserted into the SVC/RA junction through a valved peel-away sheath. Position was confirmed with fluoroscopy. Images were obtained for documentation. Blood was aspirated from the catheter followed by saline and heparin flushes. The appropriate volume and strength of heparin was instilled in each lumen. Caps were applied. The catheter was secured at the tunnel site with Gelfoam and a pursestring suture. The venotomy site was closed with subcuticular Vicryl suture. Dermabond was applied to the small right neck incision. A dry sterile dressing was applied. The catheter is ready for use. No immediate complications. IMPRESSION: Ultrasound and fluoroscopically guided right internal jugular tunneled hemodialysis catheter (19 cm tip to cuff dialysis catheter). Electronically Signed   By: Jerilynn Mages.  Shick M.D.   On: 05/05/2018 15:25   Ir US Guide Vasc Access Right  Result Date: 05/05/2018 INDICATION: Acute kidney injury, no current access for dialysis EXAM: ULTRASOUND GUIDANCE FOR VASCULAR ACCESS RIGHT INTERNAL JUGULAR PERMANENT HEMODIALYSIS CATHETER Date:  05/05/2018 05/05/2018 3:15 pm Radiologist:  Jerilynn Mages. Daryll Brod, MD Guidance:  Ultrasound fluoroscopic FLUOROSCOPY TIME:  Fluoroscopy  Time: 0 minutes 24 seconds (2 mGy). MEDICATIONS: Ancef 2 g administered within 1 hour of procedure ANESTHESIA/SEDATION: Versed 0.5 mg IV; Fentanyl 12.5 mcg IV; Moderate Sedation Time:  18 minutes The patient was continuously monitored during the procedure by the interventional radiology nurse under my direct supervision. CONTRAST:  None. COMPLICATIONS: None immediate. PROCEDURE: Informed consent was obtained from the patient following explanation of the procedure, risks, benefits and alternatives. The patient understands, agrees and consents for the procedure. All questions were addressed. A time out was performed. Maximal barrier sterile technique utilized including caps, mask, sterile gowns, sterile gloves, large sterile drape, hand hygiene, and 2% chlorhexidine scrub. Under sterile conditions and local anesthesia, right internal jugular micropuncture venous access was performed with ultrasound. Images were obtained for documentation of the patent right internal jugular vein. A guide wire was inserted followed by a transitional dilator. Next, a 0.035 guidewire was advanced into the IVC with a 5-French catheter. Measurements were obtained from the right venotomy site to the proximal right atrium. In the right infraclavicular chest, a subcutaneous tunnel was created under sterile conditions and local anesthesia. 1% lidocaine with epinephrine was utilized for this. The 19 cm tip to cuff dialysis catheter was tunneled subcutaneously to the venotomy site and inserted into the SVC/RA junction through a valved peel-away sheath. Position was confirmed with fluoroscopy. Images were obtained for documentation. Blood was aspirated from the catheter followed by saline and heparin flushes. The appropriate volume and strength of heparin was instilled in each lumen. Caps were applied. The catheter was secured at the tunnel site with Gelfoam and a pursestring suture. The venotomy site was closed with subcuticular Vicryl suture.  Dermabond was applied to the small right neck incision. A dry sterile dressing was applied. The catheter is ready for use. No immediate complications. IMPRESSION: Ultrasound and fluoroscopically guided right internal jugular tunneled hemodialysis catheter (19 cm tip to cuff dialysis catheter). Electronically Signed   By: Jerilynn Mages.  Shick M.D.   On: 05/05/2018 15:25   Dg Chest Port 1 View  Result Date: 05/02/2018 CLINICAL DATA:  Pulmonary edema. EXAM: PORTABLE CHEST 1 VIEW COMPARISON:  05/01/2018. FINDINGS: Endotracheal tube and nasogastric tube have been removed. Right IJ central line tip projects over the SVC. Left IJ central line  tip also projects over the SVC. Heart size stable. Mild diffuse interstitial prominence and indistinctness with improvement in aeration in the lung bases. Small bilateral pleural effusions. IMPRESSION: Improving congestive heart failure. Electronically Signed   By: Lorin Picket M.D.   On: 05/02/2018 07:18   Dg Chest Port 1 View  Result Date: 05/01/2018 CLINICAL DATA:  Respiratory failure. EXAM: PORTABLE CHEST 1 VIEW COMPARISON:  04/30/2018. FINDINGS: Endotracheal tube, NG tube, right IJ line stable position. Stable cardiomegaly persistent bibasilar infiltrates/edema and small bilateral pleural effusions. No interim change. No pneumothorax. IMPRESSION: 1.  Lines and tubes in stable position. 2. Persistent bibasilar infiltrates/edema and small bilateral pleural effusions. No significant interim change. 3.  Stable cardiomegaly. Electronically Signed   By: Marcello Moores  Register   On: 05/01/2018 06:25   Dg Chest Port 1 View  Result Date: 04/30/2018 CLINICAL DATA:  Intubation EXAM: PORTABLE CHEST 1 VIEW COMPARISON:  04/29/2018 FINDINGS: Endotracheal tube in good position. NG tube in the stomach. Central venous catheter tip in the lower SVC unchanged. No pneumothorax Cardiac enlargement. Diffuse bilateral airspace disease has progressed, probable edema. Progression of bibasilar atelectasis  and small pleural effusions bilaterally. IMPRESSION: Support lines remain in good position Worsening bilateral airspace disease most consistent with pulmonary edema. Progression of bibasilar atelectasis and bilateral effusions. Electronically Signed   By: Franchot Gallo M.D.   On: 04/30/2018 07:16   Dg Chest Port 1 View  Result Date: 04/29/2018 CLINICAL DATA:  Hypoxia EXAM: PORTABLE CHEST 1 VIEW COMPARISON:  April 28, 2018 FINDINGS: Endotracheal tube tip is 2.1 cm above the carina. Right central catheter tip is in the superior vena cava. Left central catheter tip is at the junction of the left innominate vein and superior vena cava, stable. Nasogastric tube tip and side port below the diaphragm. No pneumothorax. There is bibasilar atelectasis with small pleural effusion on the right. No consolidation. Heart is mildly enlarged with pulmonary vascularity normal. No adenopathy. No bone lesions. IMPRESSION: Tube and catheter positions as described without pneumothorax. Mild bibasilar atelectasis. Small right pleural effusion. No consolidation. Stable cardiac prominence. Electronically Signed   By: Lowella Grip III M.D.   On: 04/29/2018 07:34   Dg Chest Port 1 View  Result Date: 04/28/2018 CLINICAL DATA:  ETT placement and HD catheter placement EXAM: PORTABLE CHEST 1 VIEW COMPARISON:  04/27/2018. FINDINGS: Cardiomegaly. HD catheter tip distal SVC. ET tube tip 3.3 cm above carina. BILATERAL pulmonary opacities, RIGHT worse than LEFT, consistent with pulmonary edema. Central venous catheter tip unchanged from LEFT IJ approach, proximal SVC. No pneumothorax. IMPRESSION: Cardiomegaly with pulmonary edema may be minimally worse. ET tube and HD catheter appear in satisfactory position. No pneumothorax. Electronically Signed   By: Staci Righter M.D.   On: 04/28/2018 16:57   Dg Chest Port 1 View  Result Date: 04/27/2018 CLINICAL DATA:  Acute respiratory failure EXAM: PORTABLE CHEST 1 VIEW COMPARISON:  04/25/2018  FINDINGS: Bilateral mild interstitial thickening with patchy alveolar airspace opacities at bilateral lung bases. No pleural effusion or pneumothorax. Stable cardiomediastinal silhouette. Left IJ central venous catheter with the tip projecting over the SVC in unchanged position. No acute osseous abnormality. IMPRESSION: 1. Bilateral interstitial thickening concerning for mild interstitial edema versus interstitial infection. Electronically Signed   By: Kathreen Devoid   On: 04/27/2018 08:37   Dg Chest Port 1 View  Result Date: 04/25/2018 CLINICAL DATA:  Central line placement. EXAM: PORTABLE CHEST 1 VIEW COMPARISON:  Chest radiograph performed 04/24/2018 FINDINGS: The patient's left the IJ line  is noted ending about the proximal SVC. Mild vascular congestion is noted. Mild bibasilar atelectasis is seen. No pleural effusion or pneumothorax is seen, though the lung bases are incompletely characterized. The cardiomediastinal silhouette is borderline normal in size. No acute osseous abnormalities are identified. IMPRESSION: 1. Left IJ line noted ending about the proximal SVC. No pneumothorax. 2. Mild vascular congestion noted; mild bibasilar atelectasis seen. Electronically Signed   By: Garald Balding M.D.   On: 04/25/2018 03:57   Dg Chest Portable 1 View  Result Date: 04/24/2018 CLINICAL DATA:  Initial evaluation for acute sepsis. EXAM: PORTABLE CHEST 1 VIEW COMPARISON:  Prior radiograph from 12/02/2017. FINDINGS: Moderate cardiomegaly, stable. Mediastinal silhouette within normal limits. No focal infiltrates. No pulmonary edema or visible pleural effusion. No pneumothorax. Osseous structures within normal limits. IMPRESSION: 1. No active cardiopulmonary disease. 2. Stable cardiomegaly without pulmonary edema. Electronically Signed   By: Jeannine Boga M.D.   On: 04/24/2018 22:19     CBC Recent Labs  Lab 05/07/18 0418 05/07/18 1012 05/08/18 0418 05/09/18 0641 05/10/18 0710  WBC 13.1* 13.4* 13.2*  13.0* 12.6*  HGB 7.0* 6.9* 7.9* 9.0* 9.3*  HCT 23.1* 23.1* 26.5* 30.0* 31.1*  PLT 225 230 176 211 213  MCV 81.6 82.2 83.1 84.5 85.0  MCH 24.7* 24.6* 24.8* 25.4* 25.4*  MCHC 30.3 29.9* 29.8* 30.0 29.9*  RDW 18.5* 18.6* 18.1* 18.9* 19.8*    Chemistries  Recent Labs  Lab 05/06/18 0345 05/07/18 0418 05/08/18 0418 05/09/18 0641 05/09/18 0743 05/10/18 1514  NA 138  137 136 138 137 137 138  K 4.3  4.3 3.6 3.8 3.9 3.8 4.0  CL 102  102 102 104 102 102 103  CO2 26  25 26 27 25 27 30   GLUCOSE 191*  189* 113* 126* 110* 112* 148*  BUN 52*  53* 32* 16 25* 25* 16  CREATININE 6.65*  6.58* 4.69* 3.23* 3.86* 3.65* 3.13*  CALCIUM 8.4*  8.4* 7.8* 8.2* 8.6* 8.4* 8.1*  MG  --  1.6* 1.8 1.7  --  1.7  AST 11*  --   --   --   --   --   ALT 10*  --   --   --   --   --   ALKPHOS 88  --   --   --   --   --   BILITOT 0.2*  --   --   --   --   --    ------------------------------------------------------------------------------------------------------------------ No results for input(s): CHOL, HDL, LDLCALC, TRIG, CHOLHDL, LDLDIRECT in the last 72 hours.  Lab Results  Component Value Date   HGBA1C 8.4 (H) 03/24/2018   ------------------------------------------------------------------------------------------------------------------ Recent Labs    05/09/18 0641  TSH 2.708   ------------------------------------------------------------------------------------------------------------------ No results for input(s): VITAMINB12, FOLATE, FERRITIN, TIBC, IRON, RETICCTPCT in the last 72 hours.  Coagulation profile No results for input(s): INR, PROTIME in the last 168 hours.  No results for input(s): DDIMER in the last 72 hours.  Cardiac Enzymes No results for input(s): CKMB, TROPONINI, MYOGLOBIN in the last 168 hours.  Invalid input(s): CK ------------------------------------------------------------------------------------------------------------------ No results found for: BNP   Roxan Hockey M.D on 05/10/2018 at 6:25 PM  Between 7am to 7pm - Pager - [phone]  After 7pm go to [email or web address] - password [email or web address]  [name] -  Office  [phone]   Voice Recognition Viviann Spare dictation system was used to create this note, attempts have been made to correct errors. Please contact the author with questions and/or  clarifications.

## 2018-05-10 NOTE — Clinical Social Work Note (Signed)
Clinical Social Work Assessment  Patient Details  Name: [name] MRN: [id number] Date of Birth: February 12, 1966  Date of referral:  05/10/18               Reason for consult:  Discharge Planning                Permission sought to share information with:  Family Supports, Customer service manager Permission granted to share information::  Yes, Verbal Permission Granted  Name::     Gayatri Teasdale  Agency::     Relationship::  Spouse  Contact Information:  [phone]  Housing/Transportation Living arrangements for the past 2 months:  [place] of Information:  Patient, Spouse Patient Interpreter Needed:  None Criminal Activity/Legal Involvement Pertinent to Current Situation/Hospitalization:  No - Comment as needed Significant Relationships:  Spouse Lives with:  Spouse Do you feel safe going back to the place where you live?  Yes Need for family participation in patient care:  Yes (Comment)  Care giving concerns:  CSW received consult regarding SNF placement. CSW spoke with pt & pt's spouse. Pt resides at home. Pt would like for pt to reside at SNF until pt is stronger. CSW to continue to follow and assist w/ discharge planning needs.    Social Worker assessment / plan:  CSW spoke with pt and pt's spouse regarding SNF placement. Pt and Pt's spouse are agreeable for placement.   Employment status:  Disabled (Comment on whether or not currently receiving Disability) Insurance information:  Managed Medicare PT Recommendations:  [place] / Referral to community resources:  [place]  Patient/Family's Response to care:  Patient and spouse reports agreement with discharge plan.   Patient/Family's Understanding of and Emotional Response to Diagnosis, Current Treatment, and Prognosis:  Pt/spouse is realistic regarding therapy needs and expressed being hopeful for SNF placement. Pt and spouse expressed understanding of  CSW role and discharge process as well as medical condition. No questions or concerns about plan or treatment.  Emotional Assessment Appearance:  Appears stated age Attitude/Demeanor/Rapport:  Engaged Affect (typically observed):  Accepting, Pleasant Orientation:  Oriented to Self, Oriented to Situation, Oriented to  Time Alcohol / Substance use:  Not Applicable Psych involvement (Current and /or in the community):  No (Comment)  Discharge Needs  Concerns to be addressed:  Discharge Planning Concerns Readmission within the last 30 days:  Yes Current discharge risk:  None Barriers to Discharge:  Continued Medical Work up   [employer], LCSW 05/10/2018, 2:08 PM

## 2018-05-10 NOTE — NC FL2 (Signed)
[place] LEVEL OF CARE SCREENING TOOL     IDENTIFICATION  Patient Name: [name] Birthdate: 09-03-1966 Sex: female Admission Date (Current Location): 04/24/2018  [facility] and [place] Number:  Herbalist and Address:  [facility] [place]. [facility], [place] [address], [place], [place] [address]      Provider Number: [id number]  Attending Physician Name and Address:  Roxan Hockey, MD  Relative Name and Phone Number:  Genell, Thede  [phone]    Current Level of Care: Hospital Recommended Level of Care: [place] Prior Approval Number:    Date Approved/Denied:   PASRR Number:    Discharge Plan: SNF    Current Diagnoses: Patient Active Problem List   Diagnosis Date Noted  . Non-ST elevation (NSTEMI) myocardial infarction ([place])   . Acute combined systolic and diastolic heart failure ([place])   . Acute encephalopathy   . Acute renal failure ([place])   . Acute respiratory failure ([place])   . Sepsis ([place])   . Lower limb ischemia 04/25/2018  . Microcytic anemia 04/25/2018  . Pulmonary nodule 04/25/2018  . Gangrene of lower extremity ([place]) 04/25/2018  . Femoral-popliteal bypass graft occlusion, left ([place]) 12/02/2017  . Respiratory failure with hypoxia ([place]) 12/02/2017  . Leukocytosis 12/02/2017  . Paroxysmal atrial fibrillation with rapid ventricular response ([place]) 12/02/2017  . Thrombosis 11/24/2017  . Hyperkalemia 06/26/2017  . Atherosclerosis of native artery of right lower extremity with ulceration of ankle ([place]) 05/13/2017  . Osteomyelitis of right fibula ([place]) 03/05/2017  . Non-pressure chronic ulcer of right ankle limited to breakdown of skin ([place]) 01/17/2017  . Elevated lactic acid level   . Osteomyelitis of ankle ([place]) 12/29/2016  . Chronic pain 12/24/2016  . Buttock wound, left, subsequent encounter   . Hypotension 12/23/2016  . IDDM (insulin dependent diabetes mellitus) ([place]) 12/23/2016  . Acute kidney injury  ([place]) 12/23/2016  . Malnutrition ([place]) 12/23/2016  . Thrush 12/23/2016  . Septic shock ([place]) 11/27/2016  . DM2 (diabetes mellitus, type 2) ([place]) 11/27/2016  . Chronic back pain 11/27/2016  . Orthostatic hypotension 11/27/2016  . Demand ischemia ([place])   . Hyperlipidemia LDL goal <70   . Right sided weakness   . Retinal tumor of right eye   . Pressure injury of skin 10/01/2016  . Necrotizing fasciitis ([place]) 09/30/2016  . 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 Paresthesia of both hands 03/05/2016  . DDD (degenerative disc disease), lumbosacral 03/26/2015  . DDD (degenerative disc disease), cervical 03/26/2015  . Sacroiliac joint disease 03/26/2015  . Occipital neuralgia 03/26/2015  . Pain in limb-Bilateral Leg 04/25/2014  . PAD (peripheral artery disease) ([place]) 04/05/2013  . Chronic total occlusion of artery of the extremities ([place]) 02/10/2012  . Peripheral vascular disease, unspecified ([place]) 02/10/2012    Orientation RESPIRATION BLADDER Height & Weight     Self, Time, Situation, Place  Normal Incontinent Weight: 192 lb 3.2 oz (87.2 kg) Height:  5\' 7"  (170.2 cm)  BEHAVIORAL SYMPTOMS/MOOD NEUROLOGICAL BOWEL NUTRITION STATUS      Incontinent Diet(DIET DYS, Thin fluids)  AMBULATORY STATUS COMMUNICATION OF NEEDS Skin   Limited Assist Verbally Other (Comment)(Pressure injury stage 3 on buttox, dressing: foam)                       Personal Care Assistance Level of Assistance  Bathing, Feeding, Dressing Bathing Assistance: Limited assistance Feeding assistance: Limited assistance Dressing Assistance: Limited assistance     Functional Limitations Info  Sight,  Hearing, Speech Sight Info: Impaired Hearing Info: Adequate Speech Info: Adequate    SPECIAL CARE FACTORS FREQUENCY  PT (By licensed PT), OT (By licensed OT)     PT Frequency: 2x OT Frequency: 2x            Contractures Contractures Info: Not present    Additional Factors Info  Code Status,  Allergies Code Status Info: Full Code Allergies Info: Lactose Intolerance            Current Medications (05/10/2018):  This is the current hospital active medication list Current Facility-Administered Medications  Medication Dose Route Frequency Provider Last Rate Last Dose  . 0.9 %  sodium chloride infusion (Manually program via Guardrails IV Fluids)   Intravenous Once Emokpae, Courage, MD      . 0.9 %  sodium chloride infusion   Intravenous Continuous Allie Bossier, MD 10 mL/hr at 05/08/18 0027    . acetaminophen (TYLENOL) tablet 650 mg  650 mg Oral Q6H PRN Allie Bossier, MD   650 mg at 05/10/18 1427  . alteplase (CATHFLO ACTIVASE) injection 2 mg  2 mg Intracatheter Once PRN Allie Bossier, MD      . amiodarone (PACERONE) tablet 400 mg  400 mg Oral Daily Dunn, Dayna N, PA-C   400 mg at 05/10/18 0816  . aspirin chewable tablet 81 mg  81 mg Oral Daily Allie Bossier, MD   81 mg at 05/10/18 0816  . chlorhexidine (PERIDEX) 0.12 % solution 15 mL  15 mL Mouth Rinse BID Allie Bossier, MD   15 mL at 05/10/18 0816  . Chlorhexidine Gluconate Cloth 2 % PADS 6 each  6 each Topical Q0600 Allie Bossier, MD   6 each at 05/10/18 1109  . Darbepoetin Alfa (ARANESP) injection 100 mcg  100 mcg Intravenous Q Thu-HD Madelon Lips, MD   100 mcg at 05/07/18 1407  . diphenoxylate-atropine (LOMOTIL) 2.5-0.025 MG per tablet 2 tablet  2 tablet Oral QID Roxan Hockey, MD   2 tablet at 05/10/18 1108  . feeding supplement (PRO-STAT SUGAR FREE 64) liquid 30 mL  30 mL Oral TID BM Emokpae, Courage, MD   30 mL at 05/09/18 2031  . fentaNYL (SUBLIMAZE) injection 25 mcg  25 mcg Intravenous Q4H PRN Allie Bossier, MD   25 mcg at 05/08/18 0135  . ferrous sulfate tablet 325 mg  325 mg Oral BID WC Emokpae, Courage, MD   325 mg at 05/10/18 0816  . heparin injection 1,000 Units  1,000 Units Dialysis PRN Allie Bossier, MD      . heparin injection 5,000 Units  5,000 Units Subcutaneous Q8H Roxan Hockey, MD   5,000  Units at 05/10/18 1427  . insulin aspart (novoLOG) injection 0-5 Units  0-5 Units Subcutaneous QHS Allie Bossier, MD   2 Units at 05/03/18 2201  . insulin aspart (novoLOG) injection 0-9 Units  0-9 Units Subcutaneous TID WC Allie Bossier, MD   2 Units at 05/10/18 1158  . MEDLINE mouth rinse  15 mL Mouth Rinse q12n4p Allie Bossier, MD   15 mL at 05/10/18 1159  . metoprolol tartrate (LOPRESSOR) injection 5 mg  5 mg Intravenous Q5 min PRN Blount, Scarlette Shorts T, NP      . multivitamin (RENA-VIT) tablet 1 tablet  1 tablet Oral QHS Allie Bossier, MD   1 tablet at 05/09/18 2100  . nitroGLYCERIN (NITROSTAT) SL tablet 0.4 mg  0.4 mg Sublingual Q5 min PRN Allie Bossier,  MD      . ondansetron (ZOFRAN) injection 4 mg  4 mg Intravenous Q6H PRN Allie Bossier, MD   4 mg at 05/08/18 1750  . oxyCODONE (Oxy IR/ROXICODONE) immediate release tablet 5 mg  5 mg Oral Q6H PRN Allie Bossier, MD   5 mg at 05/10/18 0546  . pantoprazole (PROTONIX) EC tablet 40 mg  40 mg Oral BID AC Emokpae, Courage, MD   40 mg at 05/10/18 0816  . phenol (CHLORASEPTIC) mouth spray 1 spray  1 spray Mouth/Throat PRN Allie Bossier, MD   1 spray at 05/06/18 [phone]  . rosuvastatin (CRESTOR) tablet 20 mg  20 mg Oral q1800 Allie Bossier, MD   20 mg at 05/09/18 1730  . sodium chloride flush (NS) 0.9 % injection 10-40 mL  10-40 mL Intracatheter Q12H Allie Bossier, MD   10 mL at 05/08/18 0912  . sodium chloride flush (NS) 0.9 % injection 10-40 mL  10-40 mL Intracatheter PRN Allie Bossier, MD         Discharge Medications: Please see discharge summary for a list of discharge medications.  Relevant Imaging Results:  Relevant Lab Results:   Additional Information SN:[id number]  Amyrie Illingworth, LCSW

## 2018-05-10 NOTE — Progress Notes (Signed)
Patient used the bedpan and was able to put out 453ml of cloudy urine.

## 2018-05-10 NOTE — Progress Notes (Signed)
Progress Note  Patient Name: [name] Date of Encounter: 05/10/2018  Primary Cardiologist: Nelva Bush, MD   Subjective  No angina or dyspnea. Has stayed in sinus rhythm for the last 24 hours.  No evidence of active bleeding. I cannot really tell if she is making any urine are not, she is not sure.  Foley catheter has been removed. Weight unchanged since yesterday, suggesting that she is really not making a lot of urinary output.  Renal parameters remaining relatively stable suggesting that she must have some recovery of renal function. Weight is down 17 pounds from peak during this admission, but is still roughly 15-20 pounds above baseline (without even accounting for the weight loss via amputation.).  Inpatient Medications    Scheduled Meds: . sodium chloride   Intravenous Once  . amiodarone  400 mg Oral Daily  . aspirin  81 mg Oral Daily  . chlorhexidine  15 mL Mouth Rinse BID  . Chlorhexidine Gluconate Cloth  6 each Topical Q0600  . darbepoetin (ARANESP) injection - DIALYSIS  100 mcg Intravenous Q Thu-HD  . diphenoxylate-atropine  2 tablet Oral QID  . feeding supplement (PRO-STAT SUGAR FREE 64)  30 mL Oral TID BM  . ferrous sulfate  325 mg Oral BID WC  . heparin injection (subcutaneous)  5,000 Units Subcutaneous Q8H  . insulin aspart  0-5 Units Subcutaneous QHS  . insulin aspart  0-9 Units Subcutaneous TID WC  . mouth rinse  15 mL Mouth Rinse q12n4p  . multivitamin  1 tablet Oral QHS  . pantoprazole  40 mg Oral BID AC  . rosuvastatin  20 mg Oral q1800  . sodium chloride flush  10-40 mL Intracatheter Q12H   Continuous Infusions: . sodium chloride 10 mL/hr at 05/08/18 0027   PRN Meds: acetaminophen, alteplase, fentaNYL (SUBLIMAZE) injection, heparin, metoprolol tartrate, nitroGLYCERIN, ondansetron (ZOFRAN) IV, oxyCODONE, phenol, sodium chloride flush   Vital Signs    Vitals:   05/09/18 1115 05/09/18 1400 05/10/18 0107 05/10/18 0539  BP: 115/62 (!) 90/58  121/68 122/70  Pulse: 85 95 79 81  Resp: 17 20 20 20   Temp: (!) 97.4 F (36.3 C) (!) 97.3 F (36.3 C) 98 F (36.7 C) 99 F (37.2 C)  TempSrc: Oral Oral Oral Oral  SpO2: 96% 98% 99% 97%  Weight: 192 lb 10.9 oz (87.4 kg)   192 lb 3.2 oz (87.2 kg)  Height:        Intake/Output Summary (Last 24 hours) at 05/10/2018 1330 Last data filed at 05/10/2018 0000 Gross per 24 hour  Intake 600 ml  Output 200 ml  Net 400 ml   Filed Weights   05/09/18 0710 05/09/18 1115 05/10/18 0539  Weight: 195 lb 12.3 oz (88.8 kg) 192 lb 10.9 oz (87.4 kg) 192 lb 3.2 oz (87.2 kg)    Telemetry    Sinus rhythm with PVCs and PACs- Personally Reviewed  ECG    Sinus rhythm, prolonged QTC 491 ms, poor anterior R wave progression, widespread T wave inversion in multiple leads- Personally Reviewed  Physical Exam  Alert and oriented, pale GEN: No acute distress.   Neck: No JVD Cardiac: RRR, no murmurs, rubs, or gallops.  Assess catheter right chest Respiratory: Clear to auscultation bilaterally. GI: Soft, nontender, non-distended  MS: No edema; right below the knee amputation stump looks healthy Neuro:  Nonfocal  Psych: Normal affect   Labs    Chemistry Recent Labs  Lab 05/06/18 0345  05/08/18 4193 05/09/18 7902 05/09/18 4097  NA 138  137   < > 138 137 137  K 4.3  4.3   < > 3.8 3.9 3.8  CL 102  102   < > 104 102 102  CO2 26  25   < > 27 25 27   GLUCOSE 191*  189*   < > 126* 110* 112*  BUN 52*  53*   < > 16 25* 25*  CREATININE 6.65*  6.58*   < > 3.23* 3.86* 3.65*  CALCIUM 8.4*  8.4*   < > 8.2* 8.6* 8.4*  PROT 5.4*  --   --   --   --   ALBUMIN 1.9*  1.9*  --   --   --  1.9*  AST 11*  --   --   --   --   ALT 10*  --   --   --   --   ALKPHOS 88  --   --   --   --   BILITOT 0.2*  --   --   --   --   GFRNONAA 6*  6*   < > 15* 12* 13*  GFRAA 7*  7*   < > 18* 14* 15*  ANIONGAP 10  10   < > 7 10 8    < > = values in this interval not displayed.     Hematology Recent Labs  Lab  05/08/18 0418 05/09/18 0641 05/10/18 0710  WBC 13.2* 13.0* 12.6*  RBC 3.19* 3.55* 3.66*  HGB 7.9* 9.0* 9.3*  HCT 26.5* 30.0* 31.1*  MCV 83.1 84.5 85.0  MCH 24.8* 25.4* 25.4*  MCHC 29.8* 30.0 29.9*  RDW 18.1* 18.9* 19.8*  PLT 176 211 213    Cardiac EnzymesNo results for input(s): TROPONINI in the last 168 hours. No results for input(s): TROPIPOC in the last 168 hours.   BNPNo results for input(s): BNP, PROBNP in the last 168 hours.   DDimer No results for input(s): DDIMER in the last 168 hours.   Radiology    No results found.  Cardiac Studies   2D echo 04/28/18 Study Conclusions - Left ventricle: The cavity size was normal. There was mild concentric hypertrophy. Systolic function was moderately to severely reduced. The estimated ejection fraction was in the range of 30% to 35%. Features are consistent with a pseudonormal left ventricular filling pattern, with concomitant abnormal relaxation and increased filling pressure (grade 2 diastolic dysfunction). Doppler parameters are consistent with elevated ventricular end-diastolic filling pressure. - Mitral valve: Calcified annulus. Moderately thickened, moderately calcified leaflets . There was mild regurgitation. - Left atrium: The atrium was mildly dilated. - Right ventricle: The cavity size was moderately dilated. Wall thickness was normal. Systolic function was moderately reduced. - Tricuspid valve: There was mild regurgitation. - Pulmonary arteries: Systolic pressure was mildly increased. PA peak pressure: 34 mm Hg (S). - Pericardium, extracardiac: A trivial pericardial effusion was identified. Features were not consistent with tamponade physiology Impressions: - When compared to the prior study from 04/17/2017 LVEF has decreased from 55-60% to 30-35% with new wall motion abnormalities - diffuse hypokinesis and akinesis of the basal and mid anteroseptal, anterior and apical septal  and anterior walls. RVEF is moderately decreased.    Patient Profile     52 y.o. female with extensive PAD, DM, paroxysmal atrial fib on Xarelto, coronary calcification on CT, chronic back pain, admitted with septic shock 04/24/2018 due to LLE gangrene with multiorgan dysfunction, small demand NSTEMI (troponin peak 7.7) and new LV dysfunction  with drop in EF to 30-35% with new WMA. 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 Shock resolved, but has acute oliguric renal failure requiring dialysis. Also found to have recurrent paroxysmal atrial fib during admission, amiodarone started due to low BP limiting use of AV node blocking agents. Heparin stopped due to anemia and suspected GI bleeding.  Assessment & Plan    1. ARF-  on intermittent HD ,  unsure of urinary output  2. CHF/ischemic cardiomyopathy -BP prohibits med titration at present time.No ACEI/ARB/spiro at present time given renal failure. Volume currently begin managed with HD, substantial improvement, but still above pre-admission weight probably 20 lb (need to take amputation into account as well).  No evidence of left heart failure signs or symptoms.  3. NSTEMI -suspected demand ischemia due to septic shock with high level of suspicion for underlying CAD. Plan cardiac cath for evaluation of coronaries once renal function improves or she is clearly ESRD and when we are confident she is not actively bleeding.  4. PAD: s/p R BKA  5.Acute on chronic normocytic anemia - combination of chronic illness, renal failure and possible GI bleeding (heme+ black stools afew days ago)  6. Paroxysmal atrial fib-good response to amio. QT a little long already at baseline.  QTc 491 today is acceptable.     For questions or updates, please contact [place] Please consult [email or web address] for contact info under Cardiology/STEMI.      Signed, Sanda Klein, MD  05/10/2018, 1:30 PM

## 2018-05-10 NOTE — Progress Notes (Signed)
[facility] Progress Note    Assessment/ Plan:   1. Anuric AKI from ATN, normal baseline Scr.  Started HD 04/29/18, s/p TDC with IR 6/19, pulled out temp cath. HD 6/19, 6/20, 6/22.  Rate of rise decreasing between treatments- hopefully will have meaningful recovery.  Of note, her mental status is markedly improved after each HD treatment so I'm concerned that her creatinine rise isn't actually reflective of the buildup of uremic toxins.  Continue to follow.  Next HD rx planned 6/25.  2. PAD, s/p L AKA 6/9 for ischemic gangrenous LLE ([name])  3. Septic shock, resolved, s/p antibiotics, stress dose steroids, pressors  4. NSTEMI, cardiology following new depressed LVEF and RWMA on 6/11 TTE; needs LHC--> have discussed with cardiology, holding off on cath for now until potential for renal recovery more clearly elucidated and bleeding issues sorted out  5. Hx/o AFib; RVR, off hep gtt due to hemoccult + stools  6. Anemia: likely of blood loss and renal failure- s/p 1 u pRBCs, Aranesp 6/20.  7. Dispo: pending assessment of renal recovery and cardiac workup  Subjective:    Much more alert and awake this AM after HD yesterday.  Husband at bedside.  Pt reports she's urinating but can't quantify how much/ how many times.       Objective:   BP 122/70 (BP Location: Right Arm)   Pulse 81   Temp 99 F (37.2 C) (Oral)   Resp 20   Ht 5\' 7"  (1.702 m)   Wt 87.2 kg (192 lb 3.2 oz)   SpO2 97%   BMI 30.10 kg/m   Intake/Output Summary (Last 24 hours) at 05/10/2018 1339 Last data filed at 05/10/2018 0000 Gross per 24 hour  Intake 600 ml  Output 200 ml  Net 400 ml   Weight change: -2.3 kg (-5 lb 1.1 oz)  Physical Exam: RUE:[id number] ill-appearing, lying in bed, awake and alert NWG:NFAO, poor dentition with mult caries CV:RRR, no rub, normal S1 and S2 CHEST: R IJ TDC in place PULM:CTAB anteriorly ZHY:QMVH, nontender QIO:NGEXB lower extremity without edema, LLE s/p  AKA with staple line c/d/i  Imaging: No results found.  Labs: BMET Recent Labs  Lab 05/04/18 0445 05/05/18 0524 05/06/18 0345 05/07/18 0418 05/08/18 0418 05/09/18 0641 05/09/18 0743  NA 137 136 138  137 136 138 137 137  K 4.6 4.5 4.3  4.3 3.6 3.8 3.9 3.8  CL 100* 100* 102  102 102 104 102 102  CO2 25 26 26  25 26 27 25 27   GLUCOSE 195* 189* 191*  189* 113* 126* 110* 112*  BUN 36* 44* 52*  53* 32* 16 25* 25*  CREATININE 5.65* 6.09* 6.65*  6.58* 4.69* 3.23* 3.86* 3.65*  CALCIUM 8.6* 8.7* 8.4*  8.4* 7.8* 8.2* 8.6* 8.4*  PHOS  --   --  7.8* 4.8*  --   --  5.2*   CBC Recent Labs  Lab 05/07/18 1012 05/08/18 0418 05/09/18 0641 05/10/18 0710  WBC 13.4* 13.2* 13.0* 12.6*  HGB 6.9* 7.9* 9.0* 9.3*  HCT 23.1* 26.5* 30.0* 31.1*  MCV 82.2 83.1 84.5 85.0  PLT 230 176 211 213    Medications:    . sodium chloride   Intravenous Once  . amiodarone  400 mg Oral Daily  . aspirin  81 mg Oral Daily  . chlorhexidine  15 mL Mouth Rinse BID  . Chlorhexidine Gluconate Cloth  6 each Topical Q0600  . darbepoetin (ARANESP) injection - DIALYSIS  100 mcg Intravenous Q Thu-HD  . diphenoxylate-atropine  2 tablet Oral QID  . feeding supplement (PRO-STAT SUGAR FREE 64)  30 mL Oral TID BM  . ferrous sulfate  325 mg Oral BID WC  . heparin injection (subcutaneous)  5,000 Units Subcutaneous Q8H  . insulin aspart  0-5 Units Subcutaneous QHS  . insulin aspart  0-9 Units Subcutaneous TID WC  . mouth rinse  15 mL Mouth Rinse q12n4p  . multivitamin  1 tablet Oral QHS  . pantoprazole  40 mg Oral BID AC  . rosuvastatin  20 mg Oral q1800  . sodium chloride flush  10-40 mL Intracatheter Q12H      Madelon Lips MD 05/10/2018, 1:39 PM

## 2018-05-11 ENCOUNTER — Ambulatory Visit: Payer: Medicare Other | Admitting: Surgery

## 2018-05-11 ENCOUNTER — Ambulatory Visit: Payer: Medicare Other | Admitting: Infectious Disease

## 2018-05-11 DIAGNOSIS — N170 Acute kidney failure with tubular necrosis: Secondary | ICD-10-CM

## 2018-05-11 LAB — GLUCOSE, CAPILLARY
GLUCOSE-CAPILLARY: 120 mg/dL — AB (ref 65–99)
GLUCOSE-CAPILLARY: 156 mg/dL — AB (ref 65–99)
Glucose-Capillary: 127 mg/dL — ABNORMAL HIGH (ref 65–99)
Glucose-Capillary: 152 mg/dL — ABNORMAL HIGH (ref 65–99)

## 2018-05-11 LAB — BASIC METABOLIC PANEL
ANION GAP: 10 (ref 5–15)
BUN: 20 mg/dL (ref 6–20)
CHLORIDE: 102 mmol/L (ref 101–111)
CO2: 27 mmol/L (ref 22–32)
Calcium: 8.1 mg/dL — ABNORMAL LOW (ref 8.9–10.3)
Creatinine, Ser: 3.49 mg/dL — ABNORMAL HIGH (ref 0.44–1.00)
GFR, EST AFRICAN AMERICAN: 16 mL/min — AB (ref >60)
GFR, EST NON AFRICAN AMERICAN: 14 mL/min — AB (ref >60)
Glucose, Bld: 105 mg/dL — ABNORMAL HIGH (ref 65–99)
Potassium: 4.2 mmol/L (ref 3.5–5.1)
SODIUM: 139 mmol/L (ref 135–145)

## 2018-05-11 LAB — CBC
HCT: 29.9 % — ABNORMAL LOW (ref 36.0–46.0)
HEMOGLOBIN: 8.7 g/dL — AB (ref 12.0–15.0)
MCH: 25.3 pg — ABNORMAL LOW (ref 26.0–34.0)
MCHC: 29.1 g/dL — ABNORMAL LOW (ref 30.0–36.0)
MCV: 86.9 fL (ref 78.0–100.0)
Platelets: 242 10*3/uL (ref 150–400)
RBC: 3.44 MIL/uL — AB (ref 3.87–5.11)
RDW: 20.8 % — ABNORMAL HIGH (ref 11.5–15.5)
WBC: 10.4 10*3/uL (ref 4.0–10.5)

## 2018-05-11 LAB — MAGNESIUM: MAGNESIUM: 1.6 mg/dL — AB (ref 1.7–2.4)

## 2018-05-11 MED ORDER — MAGNESIUM SULFATE 2 GM/50ML IV SOLN
2.0000 g | Freq: Once | INTRAVENOUS | Status: AC
  Administered 2018-05-11: 2 g via INTRAVENOUS
  Filled 2018-05-11: qty 50

## 2018-05-11 MED ORDER — MAGNESIUM SULFATE 2 GM/50ML IV SOLN: 2.0000 g | Freq: Once | INTRAVENOUS | Status: DC

## 2018-05-11 MED ORDER — METOPROLOL TARTRATE 12.5 MG HALF TABLET
12.5000 mg | ORAL_TABLET | Freq: Two times a day (BID) | ORAL | Status: DC
  Administered 2018-05-11 – 2018-05-22 (×19): 12.5 mg via ORAL
  Filled 2018-05-11 (×23): qty 1

## 2018-05-11 MED ORDER — PROMETHAZINE HCL 25 MG/ML IJ SOLN
25.0000 mg | Freq: Three times a day (TID) | INTRAMUSCULAR | Status: DC | PRN
  Administered 2018-05-11 – 2018-05-17 (×2): 25 mg via INTRAVENOUS
  Filled 2018-05-11 (×2): qty 1

## 2018-05-11 NOTE — Care Management Note (Signed)
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  Expected Discharge Date:   possibly 05/15/2018               Expected Discharge Plan:   SNF  In-House Referral:  Clinical Social Work  Discharge planning Services  CM Consult  Status of Service:  In process, will continue to follow  Sherrilyn Rist [phone] 05/11/2018, 9:53 AM

## 2018-05-11 NOTE — Clinical Social Work Note (Signed)
CSW provided patient with list of bed offers. She will review with her husband and have him call CSW with choice.  Dayton Scrape, [place]

## 2018-05-11 NOTE — Progress Notes (Signed)
05/08/18 1600  PT Visit Information  Last PT Received On 05/08/18  Assistance Needed +2  History of Present Illness 52 year old female with PMH of DM, PAD, A.Fib on Xarelto, Chronic Wounds of Left Foot with H/O of ABF and Fem-Pop Bypass. Recent Admission 5/10-5/14 for redo of Left femoral to PTA bypass with cryopreserved vein. Post-Op patient lost pulse to left foot and was taken back to OR for thrombectomy. On 6/3 patient went to outpatient vascular appointment where she was found to by hypotensive and febrile with a occlusion noted to bypass graft. 6/7 patient presented to ED with AMS with pain to left lower leg and emesis and diarrhea. 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  Course has been complicated by AKI and NSTEMI.  Now requiring intermittent HD. ETT 6/11-6/14.   Subjective Data  Patient Stated Goal to get up and stand  Precautions  Precautions Fall  Precaution Comments telemetry  Restrictions  Weight Bearing Restrictions Yes  Other Position/Activity Restrictions NWB on L Leg  Pain Assessment  Pain Assessment No/denies pain  Faces Pain Scale 6  Pain Location L leg  Pain Intervention(s) Limited activity within patient's tolerance;Monitored during session;Premedicated before session;Repositioned  Cognition  Arousal/Alertness Awake/alert  Behavior During Therapy WFL for tasks assessed/performed  Overall Cognitive Status Within Functional Limits for tasks assessed  Bed Mobility  Overal bed mobility Needs Assistance  Bed Mobility Supine to Sit;Sit to Supine  Supine to sit Mod assist  Sit to supine Mod assist  General bed mobility comments assisting with dense cues for sequence, support to trunk  Transfers  Overall transfer level Needs assistance  Equipment used 1 person hand held assist  Transfers Lateral/Scoot Transfers;Sit to/from Stand  Sit to Stand Total assist   Lateral/Scoot Transfers Mod assist  General transfer comment pt cannot stand but can assist with slide using  bed pad and support of RUE  Ambulation/Gait  General Gait Details unable  Balance  Overall balance assessment Needs assistance  Sitting-balance support Feet supported;Bilateral upper extremity supported  Sitting balance-Leahy Scale Fair  Standing balance-Leahy Scale Zero  General Comments  General comments (skin integrity, edema, etc.) clean incision on L thigh with staples  PT - End of Session  Equipment Utilized During Treatment Gait belt  Activity Tolerance Patient limited by fatigue;Treatment limited secondary to medical complications (Comment)  Patient left in bed;with call bell/phone within reach;with bed alarm set;with nursing/sitter in room  Nurse Communication Mobility status   PT - Assessment/Plan  PT Visit Diagnosis Unsteadiness on feet (R26.81);Muscle weakness (generalized) (M62.81)  PT Frequency (ACUTE ONLY) Min 2X/week  Follow Up Recommendations SNF  PT equipment None recommended by PT  AM-PAC PT "6 Clicks" Daily Activity Outcome Measure  Difficulty turning over in bed (including adjusting bedclothes, sheets and blankets)? 1  Difficulty moving from lying on back to sitting on the side of the bed?  1  Difficulty sitting down on and standing up from a chair with arms (e.g., wheelchair, bedside commode, etc,.)? 1  Help needed moving to and from a bed to chair (including a wheelchair)? 2  Help needed walking in hospital room? 1  Help needed climbing 3-5 steps with a railing?  1  6 Click Score 7  Mobility G Code  CM  Acute Rehab PT Goals  PT Goal Formulation With patient  Time For Goal Achievement 05/22/18  Potential to Achieve Goals Good  PT Time Calculation  PT Start Time (ACUTE ONLY) 1055  PT Stop Time (ACUTE ONLY)  1125  PT Time Calculation (min) (ACUTE ONLY) 30 min  PT G-Codes **NOT FOR INPATIENT CLASS**  Functional Assessment Tool Used AM-PAC 6 Clicks Basic Mobility  PT General Charges  $$ ACUTE PT VISIT 1 Visit  PT Evaluation  $PT Eval Moderate Complexity 1  Mod  PT Treatments  $Therapeutic Activity 8-22 mins    Mee Hives, PT MS Acute Rehab Dept. Number: [place] [facility] [place]

## 2018-05-11 NOTE — Progress Notes (Signed)
05/08/18 1600  PT Visit Information  Last PT Received On 05/08/18  Assistance Needed +2  History of Present Illness 52 year old female with PMH of DM, PAD, A.Fib on Xarelto, Chronic Wounds of Left Foot with H/O of ABF and Fem-Pop Bypass. Recent Admission 5/10-5/14 for redo of Left femoral to PTA bypass with cryopreserved vein. Post-Op patient lost pulse to left foot and was taken back to OR for thrombectomy. On 6/3 patient went to outpatient vascular appointment where she was found to by hypotensive and febrile with a occlusion noted to bypass graft. 6/7 patient presented to ED with AMS with pain to left lower leg and emesis and diarrhea. 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  Course has been complicated by AKI and NSTEMI.  Now requiring intermittent HD. ETT 6/11-6/14.   Subjective Data  Patient Stated Goal to get up and stand  Precautions  Precautions Fall  Precaution Comments telemetry  Restrictions  Weight Bearing Restrictions Yes  Other Position/Activity Restrictions NWB on L Leg  Pain Assessment  Pain Assessment No/denies pain  Faces Pain Scale 6  Pain Location L leg  Pain Intervention(s) Limited activity within patient's tolerance;Monitored during session;Premedicated before session;Repositioned  Cognition  Arousal/Alertness Awake/alert  Behavior During Therapy WFL for tasks assessed/performed  Overall Cognitive Status Within Functional Limits for tasks assessed  Bed Mobility  Overal bed mobility Needs Assistance  Bed Mobility Supine to Sit;Sit to Supine  Supine to sit Mod assist  Sit to supine Mod assist  General bed mobility comments assisting with dense cues for sequence, support to trunk  Transfers  Overall transfer level Needs assistance  Equipment used 1 person hand held assist  Transfers Lateral/Scoot Transfers;Sit to/from Stand  Sit to Stand Total assist   Lateral/Scoot Transfers Mod assist  General transfer comment pt cannot stand but can assist with slide using  bed pad and support of RUE  Ambulation/Gait  General Gait Details unable  Balance  Overall balance assessment Needs assistance  Sitting-balance support Feet supported;Bilateral upper extremity supported  Sitting balance-Leahy Scale Fair  Standing balance-Leahy Scale Zero  General Comments  General comments (skin integrity, edema, etc.) clean incision on L thigh with staples  PT - End of Session  Equipment Utilized During Treatment Gait belt  Activity Tolerance Patient limited by fatigue;Treatment limited secondary to medical complications (Comment)  Patient left in bed;with call bell/phone within reach;with bed alarm set;with nursing/sitter in room  Nurse Communication Mobility status   PT - Assessment/Plan  PT Visit Diagnosis Unsteadiness on feet (R26.81);Muscle weakness (generalized) (M62.81)  PT Frequency (ACUTE ONLY) Min 2X/week  Follow Up Recommendations SNF  PT equipment None recommended by PT  AM-PAC PT "6 Clicks" Daily Activity Outcome Measure  Difficulty turning over in bed (including adjusting bedclothes, sheets and blankets)? 1  Difficulty moving from lying on back to sitting on the side of the bed?  1  Difficulty sitting down on and standing up from a chair with arms (e.g., wheelchair, bedside commode, etc,.)? 1  Help needed moving to and from a bed to chair (including a wheelchair)? 2  Help needed walking in hospital room? 1  Help needed climbing 3-5 steps with a railing?  1  6 Click Score 7  Mobility G Code  CM  Acute Rehab PT Goals  PT Goal Formulation With patient  Time For Goal Achievement 05/22/18  Potential to Achieve Goals Good  PT Time Calculation  PT Start Time (ACUTE ONLY) 1055  PT Stop Time (ACUTE ONLY)  1125  PT Time Calculation (min) (ACUTE ONLY) 30 min  PT G-Codes **NOT FOR INPATIENT CLASS**  Functional Assessment Tool Used AM-PAC 6 Clicks Basic Mobility  PT General Charges  $$ ACUTE PT VISIT 1 Visit  PT Evaluation  $PT Eval Moderate Complexity 1  Mod  PT Treatments  $Therapeutic Activity 8-22 mins    Mee Hives, PT MS Acute Rehab Dept. Number: [place] [facility] [place]

## 2018-05-11 NOTE — Progress Notes (Signed)
Patient Demographics:    [name], is a 52 y.o. female, DOB - [birth year], VZC:[id number]  Admit date - 04/24/2018   Admitting Physician [name], MD  Outpatient Primary MD for the patient is [name], MD  LOS - [phone]   Chief Complaint  Patient presents with  . 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)   PAD (peripheral artery disease) (HCC)   Pressure injury of skin   DM2 (diabetes mellitus, type 2) (HCC)   Acute kidney injury ([place])   Buttock wound, left, subsequent encounter   Paroxysmal atrial fibrillation with rapid ventricular response (HCC)   Lower limb ischemia   Microcytic anemia   Gangrene of lower extremity (HCC)   Acute renal failure (HCC)   Acute respiratory failure (HCC)   Sepsis (HCC)   Acute encephalopathy   Acute combined systolic and diastolic heart failure (HCC)   Non-ST elevation (NSTEMI) myocardial infarction [facility])   Brief Narrative:  52 y/o WF  PMHx  Depression, Diabetes mellitus type II uncontrolled with complication, Diabetic neuropathy ,  Femoral-popliteal bypass graft occlusion, 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).She also had a NSTEMI with new cardiomyopathy.   Subsequently developed AKI secondary to ATN  requiring hemodialysis and still without any significant renal recovery  Plan:- 1)Acute on chronic Anemia--stable at this time, hemoglobin is 8.7, she received  1 unit of packed  cells on 05/07/2018, and Aranesp shot x1 on 05/07/18,  Hemoccult positive, heparin drip has been discontinued by cardiology team, given CAD try to keep hemoglobin at least close to 9, c/n  Aranesp per nephrology team.  No evidence of obvious/active bleeding at this time  2)NSTEMI--suspect due to demand ischemia in the setting of septic shock, clinically high index of suspicion for underlying CAD-cardiology input appreciated, IV heparin drip discontinued after 48 hours per cardiology team, give metoprolol 12.5 mg twice daily, titrate metoprolol up as BP tolerates, at this time patient unable to tolerate ACE/ARB/Aldactone due to renal concerns, c/n aspirin and crestor  3)AKI/ATN-renal failure in the setting of sepsis with multiorgan failure and persistent hypotension requiring pressors, no significant renal recovery, limited  urine output, hemodialysis was started on 04/29/2018,  4)PAFib--- this is not new, unable to anticoagulate due to #1 above, rate controlled better with amiodarone as ordered by cardiology team , give metoprolol 12.5 mg twice daily, titrate metoprolol up as BP tolerates  5)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 by Dr [name]  6)HFrEF--patient with combined systolic and diastolic dysfunction CHF, echo with EF of 35% with grade 2 diastolic dysfunction, at this time patient unable to tolerate ACE/ARB/Aldactone due to renal concerns, continue to use hemodialysis to address volume status, weight is down , give metoprolol 12.5 mg twice  daily, titrate metoprolol up as BP tolerates  7)Developmental delay/cognitive deficits--- baseline patient has cognitive deficits which have worsened since acute infection  8)DM2-poor control, A1c 8.4, Use Novolog/Humalog Sliding scale insulin with Accu-Cheks/Fingersticks as ordered  9)Diarrhea--patient reports long-standing history of intermittent diarrhea, C. difficile testing previously negative, diarrhea has improved on increased doses of Lomotil ,  will decrease the #2-2 tablets 3 times a day    Code Status : Full  Disposition Plan  : SNF , need to resolve hemodialysis issues first  Consults  :  PCCM/cardiology/nephrology  DVT Prophylaxis  :   Heparin [place]  Lab Results  Component Value Date   PLT 242 05/11/2018    Inpatient Medications  Scheduled Meds: . sodium chloride   Intravenous Once  . amiodarone  400 mg Oral Daily  . aspirin  81 mg Oral Daily  . chlorhexidine  15 mL Mouth Rinse BID  . Chlorhexidine Gluconate Cloth  6 each Topical Q0600  . darbepoetin (ARANESP) injection - DIALYSIS  100 mcg Intravenous Q Thu-HD  . diphenoxylate-atropine  2 tablet Oral QID  . feeding supplement (PRO-STAT SUGAR FREE 64)  30 mL Oral TID BM  . ferrous sulfate  325 mg Oral BID WC  . heparin injection (subcutaneous)  5,000 Units Subcutaneous Q8H  . insulin aspart  0-5 Units Subcutaneous QHS  . insulin aspart  0-9 Units Subcutaneous TID WC  . mouth rinse  15 mL Mouth Rinse q12n4p  . metoprolol tartrate  12.5 mg Oral BID  . multivitamin  1 tablet Oral QHS  . pantoprazole  40 mg Oral BID AC  . rosuvastatin  20 mg Oral q1800  . sodium chloride flush  10-40 mL Intracatheter Q12H   Continuous Infusions: . sodium chloride 10 mL/hr at 05/08/18 0027   PRN Meds:.acetaminophen, alteplase, fentaNYL (SUBLIMAZE) injection, heparin, metoprolol tartrate, nitroGLYCERIN, oxyCODONE, phenol, promethazine, sodium chloride flush    Anti-infectives (From admission, onward)   Start     Dose/Rate Route Frequency Ordered Stop   05/05/18 1354  ceFAZolin (ANCEF) 2-4 GM/100ML-% IVPB  Status:  Discontinued    Note to Pharmacy:  Gar Ponto   : cabinet override      05/05/18 1354 05/05/18 1451   05/05/18 0930  ceFAZolin (ANCEF) IVPB 2g/100 mL premix     2 g 200 mL/hr over 30 Minutes Intravenous On call 05/05/18 0924 05/06/18 0930   04/30/18 0900  vancomycin (VANCOCIN) IVPB 750 mg/150 ml premix  Status:  Discontinued     750 mg 150 mL/hr over 60 Minutes  Intravenous  Once 04/30/18 0819 04/30/18 0828   04/28/18 1400  cefTAZidime (FORTAZ) 1 g in sodium chloride 0.9 % 100 mL IVPB     1 g 200 mL/hr over 30 Minutes Intravenous Every 24 hours 04/28/18 1301 05/01/18 1416   04/26/18 0600  vancomycin (VANCOCIN) IVPB 1000 mg/200 mL premix  Status:  Discontinued     1,000 mg 200 mL/hr over 60 Minutes Intravenous Every 48 hours 04/26/18 0515 04/28/18 1152   04/25/18 1000  vancomycin (VANCOCIN) IVPB 1000 mg/200 mL premix  Status:  Discontinued     1,000 mg 200 mL/hr over 60 Minutes Intravenous Every 12 hours 04/24/18 2213 04/25/18 0215   04/25/18 0600  piperacillin-tazobactam (ZOSYN) IVPB 3.375 g  Status:  Discontinued     3.375 g 12.5 mL/hr over 240 Minutes Intravenous Every 8 hours 04/24/18 2213 04/25/18 0220   04/25/18 0600  piperacillin-tazobactam (ZOSYN) IVPB 2.25 g  Status:  Discontinued  2.25 g 100 mL/hr over 30 Minutes Intravenous Every 8 hours 04/25/18 0220 04/28/18 1152   04/24/18 2100  piperacillin-tazobactam (ZOSYN) IVPB 3.375 g     3.375 g 100 mL/hr over 30 Minutes Intravenous  Once 04/24/18 2050 04/24/18 2242   04/24/18 2100  vancomycin (VANCOCIN) IVPB 1000 mg/200 mL premix     1,000 mg 200 mL/hr over 60 Minutes Intravenous  Once 04/24/18 2050 04/24/18 2352        Objective:   Vitals:   05/10/18 2040 05/11/18 0634 05/11/18 0848 05/11/18 1130  BP: (!) 90/54 127/70 (!) 123/58 131/75  Pulse: 72 77 82 83  Resp: 18 16  18   Temp: 98.5 F (36.9 C) 98.7 F (37.1 C)  97.7 F (36.5 C)  TempSrc: Oral Oral  Oral  SpO2: 94% 93% 96% 100%  Weight:  88.2 kg (194 lb 7.1 oz)    Height:        Wt Readings from Last 3 Encounters:  05/11/18 88.2 kg (194 lb 7.1 oz)  04/20/18 72.6 kg (160 lb)  03/31/18 79.4 kg (175 lb)     Intake/Output Summary (Last 24 hours) at 05/11/2018 1550 Last data filed at 05/11/2018 1400 Gross per 24 hour  Intake 404.99 ml  Output 500 ml  Net -95.01 ml     Physical Exam  Gen:- Awake Alert,  In no  apparent distress  HEENT:- [place].AT, No sclera icterus Neck-Supple Neck,No JVD,  Rt IJ HD catheter.  Lungs-  CTAB , good air movement  CV- S1, S2 normal Abd-  +ve B.Sounds, Abd Soft, No tenderness,    Extremity/Skin:- LT AKA intact suture/staple line Psych-affect is appropriate, oriented x3, poor judgment and cognitive concerns persist Neuro-no new focal deficits, no tremors   Data Review:   Micro Results No results found for this or any previous visit (from the past 240 hour(s)).  Radiology Reports US Renal  Result Date: 04/25/2018 CLINICAL DATA:  Initial evaluation for acute renal injury. EXAM: RENAL / URINARY TRACT ULTRASOUND COMPLETE COMPARISON:  Prior CT from 11/30/2017 FINDINGS: Right Kidney: Length: 14.4 cm. Echogenicity within normal limits. No mass or hydronephrosis visualized. Left Kidney: Length: 13.7 cm. Echogenicity within normal limits. No mass or hydronephrosis visualized. Bladder: Bladder decompressed with a Foley catheter in place. IMPRESSION: 1. Negative renal ultrasound.  No hydronephrosis. 2. Bladder decompressed with a Foley catheter in place. Electronically Signed   By: Jeannine Boga M.D.   On: 04/25/2018 06:55   Ir Fluoro Guide Cv Line Right  Result Date: 05/05/2018 INDICATION: Acute kidney injury, no current access for dialysis EXAM: ULTRASOUND GUIDANCE FOR VASCULAR ACCESS RIGHT INTERNAL JUGULAR PERMANENT HEMODIALYSIS CATHETER Date:  05/05/2018 05/05/2018 3:15 pm Radiologist:  Jerilynn Mages. Daryll Brod, MD Guidance:  Ultrasound fluoroscopic FLUOROSCOPY TIME:  Fluoroscopy Time: 0 minutes 24 seconds (2 mGy). MEDICATIONS: Ancef 2 g administered within 1 hour of procedure ANESTHESIA/SEDATION: Versed 0.5 mg IV; Fentanyl 12.5 mcg IV; Moderate Sedation Time:  18 minutes The patient was continuously monitored during the procedure by the interventional radiology nurse under my direct supervision. CONTRAST:  None. COMPLICATIONS: None immediate. PROCEDURE: Informed consent was obtained from  the patient following explanation of the procedure, risks, benefits and alternatives. The patient understands, agrees and consents for the procedure. All questions were addressed. A time out was performed. Maximal barrier sterile technique utilized including caps, mask, sterile gowns, sterile gloves, large sterile drape, hand hygiene, and 2% chlorhexidine scrub. Under sterile conditions and local anesthesia, right internal jugular micropuncture venous access was performed with  ultrasound. Images were obtained for documentation of the patent right internal jugular vein. A guide wire was inserted followed by a transitional dilator. Next, a 0.035 guidewire was advanced into the IVC with a 5-French catheter. Measurements were obtained from the right venotomy site to the proximal right atrium. In the right infraclavicular chest, a subcutaneous tunnel was created under sterile conditions and local anesthesia. 1% lidocaine with epinephrine was utilized for this. The 19 cm tip to cuff dialysis catheter was tunneled subcutaneously to the venotomy site and inserted into the SVC/RA junction through a valved peel-away sheath. Position was confirmed with fluoroscopy. Images were obtained for documentation. Blood was aspirated from the catheter followed by saline and heparin flushes. The appropriate volume and strength of heparin was instilled in each lumen. Caps were applied. The catheter was secured at the tunnel site with Gelfoam and a pursestring suture. The venotomy site was closed with subcuticular Vicryl suture. Dermabond was applied to the small right neck incision. A dry sterile dressing was applied. The catheter is ready for use. No immediate complications. IMPRESSION: Ultrasound and fluoroscopically guided right internal jugular tunneled hemodialysis catheter (19 cm tip to cuff dialysis catheter). Electronically Signed   By: Jerilynn Mages.  Shick M.D.   On: 05/05/2018 15:25   Ir US Guide Vasc Access Right  Result Date:  05/05/2018 INDICATION: Acute kidney injury, no current access for dialysis EXAM: ULTRASOUND GUIDANCE FOR VASCULAR ACCESS RIGHT INTERNAL JUGULAR PERMANENT HEMODIALYSIS CATHETER Date:  05/05/2018 05/05/2018 3:15 pm Radiologist:  Jerilynn Mages. Daryll Brod, MD Guidance:  Ultrasound fluoroscopic FLUOROSCOPY TIME:  Fluoroscopy Time: 0 minutes 24 seconds (2 mGy). MEDICATIONS: Ancef 2 g administered within 1 hour of procedure ANESTHESIA/SEDATION: Versed 0.5 mg IV; Fentanyl 12.5 mcg IV; Moderate Sedation Time:  18 minutes The patient was continuously monitored during the procedure by the interventional radiology nurse under my direct supervision. CONTRAST:  None. COMPLICATIONS: None immediate. PROCEDURE: Informed consent was obtained from the patient following explanation of the procedure, risks, benefits and alternatives. The patient understands, agrees and consents for the procedure. All questions were addressed. A time out was performed. Maximal barrier sterile technique utilized including caps, mask, sterile gowns, sterile gloves, large sterile drape, hand hygiene, and 2% chlorhexidine scrub. Under sterile conditions and local anesthesia, right internal jugular micropuncture venous access was performed with ultrasound. Images were obtained for documentation of the patent right internal jugular vein. A guide wire was inserted followed by a transitional dilator. Next, a 0.035 guidewire was advanced into the IVC with a 5-French catheter. Measurements were obtained from the right venotomy site to the proximal right atrium. In the right infraclavicular chest, a subcutaneous tunnel was created under sterile conditions and local anesthesia. 1% lidocaine with epinephrine was utilized for this. The 19 cm tip to cuff dialysis catheter was tunneled subcutaneously to the venotomy site and inserted into the SVC/RA junction through a valved peel-away sheath. Position was confirmed with fluoroscopy. Images were obtained for documentation. Blood  was aspirated from the catheter followed by saline and heparin flushes. The appropriate volume and strength of heparin was instilled in each lumen. Caps were applied. The catheter was secured at the tunnel site with Gelfoam and a pursestring suture. The venotomy site was closed with subcuticular Vicryl suture. Dermabond was applied to the small right neck incision. A dry sterile dressing was applied. The catheter is ready for use. No immediate complications. IMPRESSION: Ultrasound and fluoroscopically guided right internal jugular tunneled hemodialysis catheter (19 cm tip to cuff dialysis catheter). Electronically Signed  By: Eugenie Filler M.D.   On: 05/05/2018 15:25   Dg Chest Port 1 View  Result Date: 05/02/2018 CLINICAL DATA:  Pulmonary edema. EXAM: PORTABLE CHEST 1 VIEW COMPARISON:  05/01/2018. FINDINGS: Endotracheal tube and nasogastric tube have been removed. Right IJ central line tip projects over the SVC. Left IJ central line tip also projects over the SVC. Heart size stable. Mild diffuse interstitial prominence and indistinctness with improvement in aeration in the lung bases. Small bilateral pleural effusions. IMPRESSION: Improving congestive heart failure. Electronically Signed   By: Lorin Picket M.D.   On: 05/02/2018 07:18   Dg Chest Port 1 View  Result Date: 05/01/2018 CLINICAL DATA:  Respiratory failure. EXAM: PORTABLE CHEST 1 VIEW COMPARISON:  04/30/2018. FINDINGS: Endotracheal tube, NG tube, right IJ line stable position. Stable cardiomegaly persistent bibasilar infiltrates/edema and small bilateral pleural effusions. No interim change. No pneumothorax. IMPRESSION: 1.  Lines and tubes in stable position. 2. Persistent bibasilar infiltrates/edema and small bilateral pleural effusions. No significant interim change. 3.  Stable cardiomegaly. Electronically Signed   By: Marcello Moores  Register   On: 05/01/2018 06:25   Dg Chest Port 1 View  Result Date: 04/30/2018 CLINICAL DATA:  Intubation EXAM:  PORTABLE CHEST 1 VIEW COMPARISON:  04/29/2018 FINDINGS: Endotracheal tube in good position. NG tube in the stomach. Central venous catheter tip in the lower SVC unchanged. No pneumothorax Cardiac enlargement. Diffuse bilateral airspace disease has progressed, probable edema. Progression of bibasilar atelectasis and small pleural effusions bilaterally. IMPRESSION: Support lines remain in good position Worsening bilateral airspace disease most consistent with pulmonary edema. Progression of bibasilar atelectasis and bilateral effusions. Electronically Signed   By: Franchot Gallo M.D.   On: 04/30/2018 07:16   Dg Chest Port 1 View  Result Date: 04/29/2018 CLINICAL DATA:  Hypoxia EXAM: PORTABLE CHEST 1 VIEW COMPARISON:  April 28, 2018 FINDINGS: Endotracheal tube tip is 2.1 cm above the carina. Right central catheter tip is in the superior vena cava. Left central catheter tip is at the junction of the left innominate vein and superior vena cava, stable. Nasogastric tube tip and side port below the diaphragm. No pneumothorax. There is bibasilar atelectasis with small pleural effusion on the right. No consolidation. Heart is mildly enlarged with pulmonary vascularity normal. No adenopathy. No bone lesions. IMPRESSION: Tube and catheter positions as described without pneumothorax. Mild bibasilar atelectasis. Small right pleural effusion. No consolidation. Stable cardiac prominence. Electronically Signed   By: Lowella Grip III M.D.   On: 04/29/2018 07:34   Dg Chest Port 1 View  Result Date: 04/28/2018 CLINICAL DATA:  ETT placement and HD catheter placement EXAM: PORTABLE CHEST 1 VIEW COMPARISON:  04/27/2018. FINDINGS: Cardiomegaly. HD catheter tip distal SVC. ET tube tip 3.3 cm above carina. BILATERAL pulmonary opacities, RIGHT worse than LEFT, consistent with pulmonary edema. Central venous catheter tip unchanged from LEFT IJ approach, proximal SVC. No pneumothorax. IMPRESSION: Cardiomegaly with pulmonary edema  may be minimally worse. ET tube and HD catheter appear in satisfactory position. No pneumothorax. Electronically Signed   By: Staci Righter M.D.   On: 04/28/2018 16:57   Dg Chest Port 1 View  Result Date: 04/27/2018 CLINICAL DATA:  Acute respiratory failure EXAM: PORTABLE CHEST 1 VIEW COMPARISON:  04/25/2018 FINDINGS: Bilateral mild interstitial thickening with patchy alveolar airspace opacities at bilateral lung bases. No pleural effusion or pneumothorax. Stable cardiomediastinal silhouette. Left IJ central venous catheter with the tip projecting over the SVC in unchanged position. No acute osseous abnormality. IMPRESSION: 1. Bilateral interstitial thickening  concerning for mild interstitial edema versus interstitial infection. Electronically Signed   By: Kathreen Devoid   On: 04/27/2018 08:37   Dg Chest Port 1 View  Result Date: 04/25/2018 CLINICAL DATA:  Central line placement. EXAM: PORTABLE CHEST 1 VIEW COMPARISON:  Chest radiograph performed 04/24/2018 FINDINGS: The patient's left the IJ line is noted ending about the proximal SVC. Mild vascular congestion is noted. Mild bibasilar atelectasis is seen. No pleural effusion or pneumothorax is seen, though the lung bases are incompletely characterized. The cardiomediastinal silhouette is borderline normal in size. No acute osseous abnormalities are identified. IMPRESSION: 1. Left IJ line noted ending about the proximal SVC. No pneumothorax. 2. Mild vascular congestion noted; mild bibasilar atelectasis seen. Electronically Signed   By: Garald Balding M.D.   On: 04/25/2018 03:57   Dg Chest Portable 1 View  Result Date: 04/24/2018 CLINICAL DATA:  Initial evaluation for acute sepsis. EXAM: PORTABLE CHEST 1 VIEW COMPARISON:  Prior radiograph from 12/02/2017. FINDINGS: Moderate cardiomegaly, stable. Mediastinal silhouette within normal limits. No focal infiltrates. No pulmonary edema or visible pleural effusion. No pneumothorax. Osseous structures within normal  limits. IMPRESSION: 1. No active cardiopulmonary disease. 2. Stable cardiomegaly without pulmonary edema. Electronically Signed   By: Jeannine Boga M.D.   On: 04/24/2018 22:19     CBC Recent Labs  Lab 05/07/18 1012 05/08/18 0418 05/09/18 0641 05/10/18 0710 05/11/18 0626  WBC 13.4* 13.2* 13.0* 12.6* 10.4  HGB 6.9* 7.9* 9.0* 9.3* 8.7*  HCT 23.1* 26.5* 30.0* 31.1* 29.9*  PLT 230 176 211 213 242  MCV 82.2 83.1 84.5 85.0 86.9  MCH 24.6* 24.8* 25.4* 25.4* 25.3*  MCHC 29.9* 29.8* 30.0 29.9* 29.1*  RDW 18.6* 18.1* 18.9* 19.8* 20.8*    Chemistries  Recent Labs  Lab 05/06/18 0345 05/07/18 0418 05/08/18 0418 05/09/18 0641 05/09/18 0743 05/10/18 1514 05/11/18 0626  NA 138  137 136 138 137 137 138 139  K 4.3  4.3 3.6 3.8 3.9 3.8 4.0 4.2  CL 102  102 102 104 102 102 103 102  CO2 26  25 26 27 25 27 30 27   GLUCOSE 191*  189* 113* 126* 110* 112* 148* 105*  BUN 52*  53* 32* 16 25* 25* 16 20  CREATININE 6.65*  6.58* 4.69* 3.23* 3.86* 3.65* 3.13* 3.49*  CALCIUM 8.4*  8.4* 7.8* 8.2* 8.6* 8.4* 8.1* 8.1*  MG  --  1.6* 1.8 1.7  --  1.7 1.6*  AST 11*  --   --   --   --   --   --   ALT 10*  --   --   --   --   --   --   ALKPHOS 88  --   --   --   --   --   --   BILITOT 0.2*  --   --   --   --   --   --    ------------------------------------------------------------------------------------------------------------------ No results for input(s): CHOL, HDL, LDLCALC, TRIG, CHOLHDL, LDLDIRECT in the last 72 hours.  Lab Results  Component Value Date   HGBA1C 8.4 (H) 03/24/2018   ------------------------------------------------------------------------------------------------------------------ Recent Labs    05/09/18 0641  TSH 2.708   ------------------------------------------------------------------------------------------------------------------ No results for input(s): VITAMINB12, FOLATE, FERRITIN, TIBC, IRON, RETICCTPCT in the last 72 hours.  Coagulation profile No  results for input(s): INR, PROTIME in the last 168 hours.  No results for input(s): DDIMER in the last 72 hours.  Cardiac Enzymes No results for input(s): CKMB, TROPONINI,  MYOGLOBIN in the last 168 hours.  Invalid input(s): CK ------------------------------------------------------------------------------------------------------------------ No results found for: BNP   Roxan Hockey M.D on 05/11/2018 at 3:50 PM  Between 7am to 7pm - Pager - [phone]  After 7pm go to [email or web address] - password [email or web address]  [name] -  Office  [phone]   Voice Recognition Viviann Spare dictation system was used to create this note, attempts have been made to correct errors. Please contact the author with questions and/or clarifications.

## 2018-05-11 NOTE — Progress Notes (Signed)
Occupational Therapy Treatment Patient Details Name: [name] MRN: [id number] DOB: Oct 14, 1966 Today's Date: 05/11/2018    History of present illness 52 year old female with PMH of DM, PAD, A.Fib on Xarelto, Chronic Wounds of Left Foot with H/O of ABF and Fem-Pop Bypass. Recent Admission 5/10-5/14 for redo of Left femoral to PTA bypass with cryopreserved vein. Post-Op patient lost pulse to left foot and was taken back to OR for thrombectomy. On 6/3 patient went to outpatient vascular appointment where she was found to by hypotensive and febrile with a occlusion noted to bypass graft. 6/7 patient presented to ED with AMS with pain to left lower leg and emesis and diarrhea. 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  Course has been complicated by AKI and NSTEMI.  Now requiring intermittent HD. ETT 6/11-6/14.    OT comments  Pt progressing towards established goals. Pt performing simulated toilet transfer to drop arm recliner with Max A +2. Pt continues to demonstrating decreased strength, balance, and activity tolerance. Pt very fearful for OOB activity and required increased encouragement; verbalizing comfort after sitting in recliner. Continue to recommend dc to SNF for further OT and will continue to follow acutely to facilitate safe dc.   Follow Up Recommendations  SNF    Equipment Recommendations  Other (comment)(Defer to next venue)    Recommendations for Other Services PT consult    Precautions / Restrictions Precautions Precautions: Fall Precaution Comments: telemetry Restrictions Weight Bearing Restrictions: Yes Other Position/Activity Restrictions: NWB LLE at residual limb       Mobility Bed Mobility Overal bed mobility: Needs Assistance Bed Mobility: Supine to Sit     Supine to sit: Mod assist;+2 for physical assistance;HOB elevated     General bed mobility comments: Mod A to facilitate hips towards EOB and then elevate trunk  Transfers Overall transfer level:  Needs assistance Equipment used: 2 person hand held assist Transfers: Sit to/from [facility] Sit to Stand: Max assist;+2 physical assistance;From elevated surface Stand pivot transfers: Max assist;+2 physical assistance       General transfer comment: Max A +2 for sit<>stand x2 with right knee blocked. Using Max A and use of bed pad to facilitate hip movement to recliner    Balance Overall balance assessment: Needs assistance Sitting-balance support: Feet supported;Single extremity supported Sitting balance-Leahy Scale: Fair Sitting balance - Comments: Able to maintain static standing.   Standing balance support: Bilateral upper extremity supported;During functional activity Standing balance-Leahy Scale: Poor Standing balance comment: Reliant on physical A                           ADL either performed or assessed with clinical judgement   ADL Overall ADL's : Needs assistance/impaired Eating/Feeding: Set up;Sitting Eating/Feeding Details (indicate cue type and reason): Set up for lunch Grooming: Sitting;Wash/dry face;Set up;Supervision/safety Grooming Details (indicate cue type and reason): washing face while sitting in recliner                 Toilet Transfer: Maximal assistance;+2 for physical assistance;Stand-pivot(Drop arm recliner) Toilet Transfer Details (indicate cue type and reason): Pt performing stand pivot transfer with Max A +2 to drop arm recliner. Pt requiring R knee blocked and use of bed pad to lift and manage hips         Functional mobility during ADLs: Maximal assistance;+2 for physical assistance(stand pivot only to drop arm recliner) General ADL Comments: Pt performing bed mobility, grooming at EOB, and  sit<>stand x2. Pt limited by poor cognition, strength, and balance as well as pain     Vision       Perception     Praxis      Cognition Arousal/Alertness: Awake/alert Behavior During Therapy: WFL for tasks  assessed/performed Overall Cognitive Status: No family/caregiver present to determine baseline cognitive functioning                                 General Comments: Pt distracted by pain this session and required increased encouragement        Exercises     Shoulder Instructions       General Comments      Pertinent Vitals/ Pain       Pain Assessment: Faces Faces Pain Scale: Hurts whole lot Pain Location: L leg and back Pain Descriptors / Indicators: Headache;Grimacing Pain Intervention(s): Monitored during session;Limited activity within patient's tolerance;Repositioned  Home Living                                          Prior Functioning/Environment              Frequency  Min 2X/week        Progress Toward Goals  OT Goals(current goals can now be found in the care plan section)  Progress towards OT goals: Progressing toward goals  Acute Rehab OT Goals Patient Stated Goal: to get up and stand OT Goal Formulation: With patient Time For Goal Achievement: 05/23/18 Potential to Achieve Goals: Good ADL Goals Pt Will Perform Upper Body Dressing: with set-up;with supervision;sitting Pt Will Perform Lower Body Dressing: sitting/lateral leans;bed level;with min guard assist Pt Will Transfer to Toilet: with min assist;with transfer board;bedside commode Pt Will Perform Toileting - Clothing Manipulation and hygiene: with min guard assist;sitting/lateral leans;bed level Additional ADL Goal #1: Pt will perform bed mobility with supervision in preparation for ADLs  Plan Discharge plan remains appropriate    Co-evaluation                 AM-PAC PT "6 Clicks" Daily Activity     Outcome Measure   Help from another person eating meals?: A Little Help from another person taking care of personal grooming?: A Little Help from another person toileting, which includes using toliet, bedpan, or urinal?: A Lot Help from another  person bathing (including washing, rinsing, drying)?: A Lot Help from another person to put on and taking off regular upper body clothing?: A Little Help from another person to put on and taking off regular lower body clothing?: A Lot 6 Click Score: 15    End of Session Equipment Utilized During Treatment: Gait belt  OT Visit Diagnosis: Unsteadiness on feet (R26.81);Other abnormalities of gait and mobility (R26.89);Muscle weakness (generalized) (M62.81);Pain;Other symptoms and signs involving cognitive function Pain - Right/Left: Left Pain - part of body: Leg   Activity Tolerance Patient limited by fatigue;Patient limited by pain   Patient Left with call bell/phone within reach;in chair   Nurse Communication Mobility status;Weight bearing status        Time: 7673-4193 OT Time Calculation (min): 30 min  Charges: OT General Charges $OT Visit: 1 Visit OT Treatments $Self Care/Home Management : 23-37 mins  [place], OTR/L Acute Rehab Pager: [phone] Office: [place] 05/11/2018, 12:54 PM

## 2018-05-11 NOTE — Progress Notes (Addendum)
Progress Note  Patient Name: [name] Date of Encounter: 05/11/2018  Primary Cardiologist: Nelva Bush, MD   Subjective   Denies any SOB, significant nausea and vomiting this morning, vomitted 3 to 4 times so far. Denies abdominal pain  Inpatient Medications    Scheduled Meds: . sodium chloride   Intravenous Once  . amiodarone  400 mg Oral Daily  . aspirin  81 mg Oral Daily  . chlorhexidine  15 mL Mouth Rinse BID  . Chlorhexidine Gluconate Cloth  6 each Topical Q0600  . darbepoetin (ARANESP) injection - DIALYSIS  100 mcg Intravenous Q Thu-HD  . diphenoxylate-atropine  2 tablet Oral QID  . feeding supplement (PRO-STAT SUGAR FREE 64)  30 mL Oral TID BM  . ferrous sulfate  325 mg Oral BID WC  . heparin injection (subcutaneous)  5,000 Units Subcutaneous Q8H  . insulin aspart  0-5 Units Subcutaneous QHS  . insulin aspart  0-9 Units Subcutaneous TID WC  . mouth rinse  15 mL Mouth Rinse q12n4p  . multivitamin  1 tablet Oral QHS  . pantoprazole  40 mg Oral BID AC  . rosuvastatin  20 mg Oral q1800  . sodium chloride flush  10-40 mL Intracatheter Q12H   Continuous Infusions: . sodium chloride 10 mL/hr at 05/08/18 0027   PRN Meds: acetaminophen, alteplase, fentaNYL (SUBLIMAZE) injection, heparin, metoprolol tartrate, nitroGLYCERIN, ondansetron (ZOFRAN) IV, oxyCODONE, phenol, sodium chloride flush   Vital Signs    Vitals:   05/10/18 0539 05/10/18 1525 05/10/18 2040 05/11/18 0634  BP: 122/70 (!) 91/52 (!) 90/54 127/70  Pulse: 81 71 72 77  Resp: 20 18 18 16   Temp: 99 F (37.2 C) 98.4 F (36.9 C) 98.5 F (36.9 C) 98.7 F (37.1 C)  TempSrc: Oral Oral Oral Oral  SpO2: 97% 97% 94% 93%  Weight: 192 lb 3.2 oz (87.2 kg)   194 lb 7.1 oz (88.2 kg)  Height:        Intake/Output Summary (Last 24 hours) at 05/11/2018 0816 Last data filed at 05/11/2018 0636 Gross per 24 hour  Intake -  Output 400 ml  Net -400 ml   Filed Weights   05/09/18 1115 05/10/18 0539 05/11/18  0634  Weight: 192 lb 10.9 oz (87.4 kg) 192 lb 3.2 oz (87.2 kg) 194 lb 7.1 oz (88.2 kg)    Telemetry    NSR with PVCs, HR 90s - Personally Reviewed  ECG    NSR with TWI in lateral leads- Personally Reviewed  Physical Exam   GEN: No acute distress.   Neck: No JVD Cardiac: RRR, no murmurs, rubs, or gallops.  Respiratory: Clear to auscultation bilaterally. GI: Soft, nontender, non-distended  MS: No edema; No deformity. S/p L AKA Neuro:  Nonfocal  Psych: Normal affect   Labs    Chemistry Recent Labs  Lab 05/06/18 0345  05/09/18 0641 05/09/18 0743 05/10/18 1514  NA 138  137   < > 137 137 138  K 4.3  4.3   < > 3.9 3.8 4.0  CL 102  102   < > 102 102 103  CO2 26  25   < > 25 27 30   GLUCOSE 191*  189*   < > 110* 112* 148*  BUN 52*  53*   < > 25* 25* 16  CREATININE 6.65*  6.58*   < > 3.86* 3.65* 3.13*  CALCIUM 8.4*  8.4*   < > 8.6* 8.4* 8.1*  PROT 5.4*  --   --   --   --  ALBUMIN 1.9*  1.9*  --   --  1.9*  --   AST 11*  --   --   --   --   ALT 10*  --   --   --   --   ALKPHOS 88  --   --   --   --   BILITOT 0.2*  --   --   --   --   GFRNONAA 6*  6*   < > 12* 13* 16*  GFRAA 7*  7*   < > 14* 15* 18*  ANIONGAP 10  10   < > 10 8 5    < > = values in this interval not displayed.     Hematology Recent Labs  Lab 05/09/18 0641 05/10/18 0710 05/11/18 0626  WBC 13.0* 12.6* 10.4  RBC 3.55* 3.66* 3.44*  HGB 9.0* 9.3* 8.7*  HCT 30.0* 31.1* 29.9*  MCV 84.5 85.0 86.9  MCH 25.4* 25.4* 25.3*  MCHC 30.0 29.9* 29.1*  RDW 18.9* 19.8* 20.8*  PLT 211 213 242    Cardiac EnzymesNo results for input(s): TROPONINI in the last 168 hours. No results for input(s): TROPIPOC in the last 168 hours.   BNPNo results for input(s): BNP, PROBNP in the last 168 hours.   DDimer No results for input(s): DDIMER in the last 168 hours.   Radiology    No results found.  Cardiac Studies   Echo 04/28/2018 LV EF: 30% -   35% Study Conclusions  - Left ventricle: The cavity size  was normal. There was mild   concentric hypertrophy. Systolic function was moderately to   severely reduced. The estimated ejection fraction was in the   range of 30% to 35%. Features are consistent with a pseudonormal   left ventricular filling pattern, with concomitant abnormal   relaxation and increased filling pressure (grade 2 diastolic   dysfunction). Doppler parameters are consistent with elevated   ventricular end-diastolic filling pressure. - Mitral valve: Calcified annulus. Moderately thickened, moderately   calcified leaflets . There was mild regurgitation. - Left atrium: The atrium was mildly dilated. - Right ventricle: The cavity size was moderately dilated. Wall   thickness was normal. Systolic function was moderately reduced. - Tricuspid valve: There was mild regurgitation. - Pulmonary arteries: Systolic pressure was mildly increased. PA   peak pressure: 34 mm Hg (S). - Pericardium, extracardiac: A trivial pericardial effusion was   identified. Features were not consistent with tamponade   physiology.  Impressions:  - When compared to the prior study from 04/17/2017 LVEF has   decreased from 55-60% to 30-35% with new wall motion   abnormalities - diffuse hypokinesis and akinesis of the basal and   mid anteroseptal, anterior and apical septal and anterior walls.   RVEF is moderately decreased.  Patient Profile     52 y.o. female with extensive PAD, DM, paroxysmal atrial fib on Xarelto, coronary calcification on CT, chronic back pain,admitted with septic shock06/07/2019due to LLE gangrene with multiorgan dysfunction,small demand NSTEMI (troponinpeak7.7)and new LV dysfunction with drop in EF to 30-35% with new WMA. 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 Shock resolved, but has acute oliguric renal failure requiring dialysis. Also found to have recurrent paroxysmal atrial fib during admission, amiodarone started due to low BP limiting use of AV node blocking agents.Heparin stopped  due to anemia and suspected GI bleeding.    Assessment & Plan    1. PAF:  -  CHA2DS2 - VASc score of 4 (female, CHF, PAD/coronary calcification, DM  II). Had post op afib in Jan 2019 admission, was on Xarelto from PVD perspective, but felt this may not be needed long term if no recurrent on outpatient event monitor. It does not appears event monitor was ever done  - Went into afib around 6/20 during this admission again, currently maintaining NSR on amiodarone 400mg  daily (started on daily dosing on 6/20), continue 2 weeks then down to 200mg  daily thereafter. Not anticoagulated due to anemia.   - once BP improve, consider add 25mg  Toprol XL tomorrow if no further dip in BP  2. NSTEMI:   - felt to be demand ischemia due to septic shock. Plan cardiac cath once renal function improved or she is ESRD. Prior to cath, will need to make sure hemoglobin stabilize.  3. New cardiomyopathy  - Echo 04/28/2018 EF 30-35%, grade 2 DD, mild MR, mild TR, PA peak pressure 34 mmHg. Previous EF 55-60% on echo 04/17/2017   4. PAD: underwent L AKA by [name] on 04/26/2018  5. ARF: requiring dialysis, followed by nephrology  6. Acute on chronic normocytic anemia: 1 units of PRBC on 6/20, hemoglobin 8.7 today  7. Nausea and vomiting: QTc 490 yesterday, will switch zofran to phenergan   For questions or updates, please contact [place] Please consult [email or web address] for contact info under Cardiology/STEMI.      SignedAlmyra Deforest, PA  05/11/2018, 8:16 AM    Patient examined chart reviewed Cognitive defect with primary issue nausea and vomiting Exam also remarkable for Left AKA Agree with changing zofran to phenergan medical Rx for new DCM No cath due to CRF and CR in 3.13 range and has been improving. Amiodarone for PAF NSR today Spent over 35 minutes reviewing patients chart , examing and discussing care plan with patient  [name]

## 2018-05-11 NOTE — Progress Notes (Signed)
Changed dressing on patient's right buttock. Placed aquacel dressing and covered with pink foam.  Pt tolerated well.  Will continue to monitor.

## 2018-05-11 NOTE — Progress Notes (Signed)
[facility] NEPHROLOGY PROGRESS NOTE  Assessment/ Plan: Pt is a 52 y.o. yo female with history of diabetes, peripheral artery disease, A. fib, admitted with septic shock due to left lower extremity gangrenous with multiorgan dysfunction, non-STEMI, EF of 30 to 35%.  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  Developed and anuric AKI requiring intermittent dialysis.  Assessment/Plan:  #Anuric AKI from ATN in the setting of septic shock: Started dialysis on 6/12, tunneled catheter was placed with IR on 6/19.  Last dialysis was on 6/22.  She has urine output of 400 cc, creatinine level trending up to 3.49 today.  Repeat lab in the morning.  Monitor urine output.  Watch for renal recovery. -Avoid nephrotoxins. -No plan for dialysis today.  #Septic shock: Status post antibiotics, stress dose steroid, resolved now.  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)  # NSTEMI; may need cath, new RWMA on echo. Monitor kidney function.  # h/o Afib with RVR  Subjective: Seen and examined at bedside.  Reported nausea vomiting.  No chest pain or shortness of breath. Objective Vital signs in last 24 hours: Vitals:   05/10/18 1525 05/10/18 2040 05/11/18 0634 05/11/18 0848  BP: (!) 91/52 (!) 90/54 127/70 (!) 123/58  Pulse: 71 72 77 82  Resp: 18 18 16    Temp: 98.4 F (36.9 C) 98.5 F (36.9 C) 98.7 F (37.1 C)   TempSrc: Oral Oral Oral   SpO2: 97% 94% 93% 96%  Weight:   88.2 kg (194 lb 7.1 oz)   Height:       Weight change: -0.6 kg (-1 lb 5.2 oz)  Intake/Output Summary (Last 24 hours) at 05/11/2018 1122 Last data filed at 05/11/2018 1000 Gross per 24 hour  Intake 120 ml  Output 400 ml  Net -280 ml       Labs: Basic Metabolic Panel: Recent Labs  Lab 05/06/18 0345 05/07/18 0418  05/09/18 0743 05/10/18 1514 05/11/18 0626  NA 138  137 136   < > 137 138 139  K 4.3  4.3 3.6   < > 3.8 4.0 4.2  CL 102  102 102   < > 102 103 102  CO2 26  25 26    < > 27  30 27   GLUCOSE 191*  189* 113*   < > 112* 148* 105*  BUN 52*  53* 32*   < > 25* 16 20  CREATININE 6.65*  6.58* 4.69*   < > 3.65* 3.13* 3.49*  CALCIUM 8.4*  8.4* 7.8*   < > 8.4* 8.1* 8.1*  PHOS 7.8* 4.8*  --  5.2*  --   --    < > = values in this interval not displayed.   Liver Function Tests: Recent Labs  Lab 05/06/18 0345 05/09/18 0743  AST 11*  --   ALT 10*  --   ALKPHOS 88  --   BILITOT 0.2*  --   PROT 5.4*  --   ALBUMIN 1.9*  1.9* 1.9*   No results for input(s): LIPASE, AMYLASE in the last 168 hours. Recent Labs  Lab 05/06/18 0347  AMMONIA <9*   CBC: Recent Labs  Lab 05/07/18 1012 05/08/18 0418 05/09/18 0641 05/10/18 0710 05/11/18 0626  WBC 13.4* 13.2* 13.0* 12.6* 10.4  HGB 6.9* 7.9* 9.0* 9.3* 8.7*  HCT 23.1* 26.5* 30.0* 31.1* 29.9*  MCV 82.2 83.1 84.5 85.0 86.9  PLT 230 176 211 213 242   Cardiac Enzymes: No results for input(s):  CKTOTAL, CKMB, CKMBINDEX, TROPONINI in the last 168 hours. CBG: Recent Labs  Lab 05/10/18 0747 05/10/18 1124 05/10/18 1653 05/10/18 2210 05/11/18 0716  GLUCAP 112* 158* 113* 136* 120*    Iron Studies: No results for input(s): IRON, TIBC, TRANSFERRIN, FERRITIN in the last 72 hours. Studies/Results: No results found.  Medications: Infusions: . sodium chloride 10 mL/hr at 05/08/18 0027    Scheduled Medications: . sodium chloride   Intravenous Once  . amiodarone  400 mg Oral Daily  . aspirin  81 mg Oral Daily  . chlorhexidine  15 mL Mouth Rinse BID  . Chlorhexidine Gluconate Cloth  6 each Topical Q0600  . darbepoetin (ARANESP) injection - DIALYSIS  100 mcg Intravenous Q Thu-HD  . diphenoxylate-atropine  2 tablet Oral QID  . feeding supplement (PRO-STAT SUGAR FREE 64)  30 mL Oral TID BM  . ferrous sulfate  325 mg Oral BID WC  . heparin injection (subcutaneous)  5,000 Units Subcutaneous Q8H  . insulin aspart  0-5 Units Subcutaneous QHS  . insulin aspart  0-9 Units Subcutaneous TID WC  . mouth rinse  15 mL Mouth  Rinse q12n4p  . multivitamin  1 tablet Oral QHS  . pantoprazole  40 mg Oral BID AC  . rosuvastatin  20 mg Oral q1800  . sodium chloride flush  10-40 mL Intracatheter Q12H    have reviewed scheduled and prn medications.  Physical Exam: General:NAD, comfortable Heart:RRR, s1s2 nl Lungs:clear b/l, no cracle Abdomen:soft, Non-tender, non-distended Extremities: edema + Dialysis Access:RIJ TDC  Josede Cicero Prasad Alyssa Rotondo 05/11/2018,11:22 AM  LOS: 16 days

## 2018-05-12 LAB — RENAL FUNCTION PANEL
ALBUMIN: 1.9 g/dL — AB (ref 3.5–5.0)
Anion gap: 9 (ref 5–15)
BUN: 21 mg/dL — ABNORMAL HIGH (ref 6–20)
CHLORIDE: 100 mmol/L (ref 98–111)
CO2: 30 mmol/L (ref 22–32)
CREATININE: 4.07 mg/dL — AB (ref 0.44–1.00)
Calcium: 8.2 mg/dL — ABNORMAL LOW (ref 8.9–10.3)
GFR, EST AFRICAN AMERICAN: 13 mL/min — AB (ref >60)
GFR, EST NON AFRICAN AMERICAN: 11 mL/min — AB (ref >60)
Glucose, Bld: 130 mg/dL — ABNORMAL HIGH (ref 70–99)
PHOSPHORUS: 3.8 mg/dL (ref 2.5–4.6)
POTASSIUM: 4.4 mmol/L (ref 3.5–5.1)
Sodium: 139 mmol/L (ref 135–145)

## 2018-05-12 LAB — CBC
HCT: 28.9 % — ABNORMAL LOW (ref 36.0–46.0)
Hemoglobin: 8.5 g/dL — ABNORMAL LOW (ref 12.0–15.0)
MCH: 26 pg (ref 26.0–34.0)
MCHC: 29.4 g/dL — ABNORMAL LOW (ref 30.0–36.0)
MCV: 88.4 fL (ref 78.0–100.0)
Platelets: 242 10*3/uL (ref 150–400)
RBC: 3.27 MIL/uL — AB (ref 3.87–5.11)
RDW: 21.4 % — ABNORMAL HIGH (ref 11.5–15.5)
WBC: 9.3 10*3/uL (ref 4.0–10.5)

## 2018-05-12 LAB — GLUCOSE, CAPILLARY
GLUCOSE-CAPILLARY: 128 mg/dL — AB (ref 70–99)
Glucose-Capillary: 120 mg/dL — ABNORMAL HIGH (ref 70–99)
Glucose-Capillary: 150 mg/dL — ABNORMAL HIGH (ref 70–99)
Glucose-Capillary: 128 mg/dL — ABNORMAL HIGH (ref 70–99)

## 2018-05-12 LAB — MAGNESIUM: MAGNESIUM: 2.1 mg/dL (ref 1.7–2.4)

## 2018-05-12 MED ORDER — FERROUS SULFATE 325 (65 FE) MG PO TABS
325.0000 mg | ORAL_TABLET | Freq: Two times a day (BID) | ORAL | Status: DC
  Administered 2018-05-12 – 2018-05-22 (×21): 325 mg via ORAL
  Filled 2018-05-12 (×21): qty 1

## 2018-05-12 MED ORDER — CHLORHEXIDINE GLUCONATE CLOTH 2 % EX PADS
6.0000 | MEDICATED_PAD | Freq: Every day | CUTANEOUS | Status: DC
  Administered 2018-05-12 – 2018-05-20 (×8): 6 via TOPICAL

## 2018-05-12 MED ORDER — FUROSEMIDE 10 MG/ML IJ SOLN
80.0000 mg | Freq: Two times a day (BID) | INTRAMUSCULAR | Status: DC
  Administered 2018-05-12 – 2018-05-14 (×4): 80 mg via INTRAVENOUS
  Filled 2018-05-12 (×4): qty 8

## 2018-05-12 NOTE — Progress Notes (Addendum)
Progress Note  Patient Name: [name] Date of Encounter: 05/12/2018  Primary Cardiologist: Nelva Bush, MD   Subjective   Pt got up to chair yesterday with much difficulty and use of lift to get her back to bed. She denies chest pain, dyspnea, palpitations, lightheadedness. Still having some nausea but no longer vomiting.   Inpatient Medications    Scheduled Meds: . amiodarone  400 mg Oral Daily  . aspirin  81 mg Oral Daily  . chlorhexidine  15 mL Mouth Rinse BID  . Chlorhexidine Gluconate Cloth  6 each Topical Q0600  . darbepoetin (ARANESP) injection - DIALYSIS  100 mcg Intravenous Q Thu-HD  . diphenoxylate-atropine  2 tablet Oral QID  . feeding supplement (PRO-STAT SUGAR FREE 64)  30 mL Oral TID BM  . ferrous sulfate  325 mg Oral BID WC  . heparin injection (subcutaneous)  5,000 Units Subcutaneous Q8H  . insulin aspart  0-5 Units Subcutaneous QHS  . insulin aspart  0-9 Units Subcutaneous TID WC  . mouth rinse  15 mL Mouth Rinse q12n4p  . metoprolol tartrate  12.5 mg Oral BID  . multivitamin  1 tablet Oral QHS  . pantoprazole  40 mg Oral BID AC  . rosuvastatin  20 mg Oral q1800  . sodium chloride flush  10-40 mL Intracatheter Q12H   Continuous Infusions: . sodium chloride 10 mL/hr at 05/08/18 0027   PRN Meds: acetaminophen, alteplase, fentaNYL (SUBLIMAZE) injection, heparin, metoprolol tartrate, nitroGLYCERIN, oxyCODONE, phenol, promethazine, sodium chloride flush   Vital Signs    Vitals:   05/11/18 2011 05/11/18 2317 05/12/18 0300 05/12/18 0800  BP: (!) 89/51 112/61 (!) 107/57 118/62  Pulse: 70 68 68 70  Resp:   14   Temp:   98.1 F (36.7 C)   TempSrc:   Oral   SpO2: 95% 96% 97%   Weight:   193 lb 12.6 oz (87.9 kg)   Height:        Intake/Output Summary (Last 24 hours) at 05/12/2018 0950 Last data filed at 05/12/2018 0839 Gross per 24 hour  Intake 1364.99 ml  Output 595 ml  Net 769.99 ml   Filed Weights   05/10/18 0539 05/11/18 0634 05/12/18  0300  Weight: 192 lb 3.2 oz (87.2 kg) 194 lb 7.1 oz (88.2 kg) 193 lb 12.6 oz (87.9 kg)    Telemetry    Sinus rhythm in the 60's-70's - Personally Reviewed  ECG    No new tracings - Personally Reviewed  Physical Exam   GEN: No acute distress.   Neck: No JVD Cardiac: RRR, no murmurs, rubs, or gallops.  Respiratory: Clear to auscultation bilaterally. GI: Soft, nontender, non-distended  MS: Taught edema of right lower leg, edema of left thigh, S/P left AKA Neuro:  Nonfocal  Psych: Normal affect   Labs    Chemistry Recent Labs  Lab 05/06/18 0345  05/09/18 0743 05/10/18 1514 05/11/18 0626 05/12/18 0531  NA 138  137   < > 137 138 139 139  K 4.3  4.3   < > 3.8 4.0 4.2 4.4  CL 102  102   < > 102 103 102 100  CO2 26  25   < > 27 30 27 30   GLUCOSE 191*  189*   < > 112* 148* 105* 130*  BUN 52*  53*   < > 25* 16 20 21*  CREATININE 6.65*  6.58*   < > 3.65* 3.13* 3.49* 4.07*  CALCIUM 8.4*  8.4*   < >  8.4* 8.1* 8.1* 8.2*  PROT 5.4*  --   --   --   --   --   ALBUMIN 1.9*  1.9*  --  1.9*  --   --  1.9*  AST 11*  --   --   --   --   --   ALT 10*  --   --   --   --   --   ALKPHOS 88  --   --   --   --   --   BILITOT 0.2*  --   --   --   --   --   GFRNONAA 6*  6*   < > 13* 16* 14* 11*  GFRAA 7*  7*   < > 15* 18* 16* 13*  ANIONGAP 10  10   < > 8 5 10 9    < > = values in this interval not displayed.     Hematology Recent Labs  Lab 05/10/18 0710 05/11/18 0626 05/12/18 0531  WBC 12.6* 10.4 9.3  RBC 3.66* 3.44* 3.27*  HGB 9.3* 8.7* 8.5*  HCT 31.1* 29.9* 28.9*  MCV 85.0 86.9 88.4  MCH 25.4* 25.3* 26.0  MCHC 29.9* 29.1* 29.4*  RDW 19.8* 20.8* 21.4*  PLT 213 242 242    Cardiac EnzymesNo results for input(s): TROPONINI in the last 168 hours. No results for input(s): TROPIPOC in the last 168 hours.   BNPNo results for input(s): BNP, PROBNP in the last 168 hours.   DDimer No results for input(s): DDIMER in the last 168 hours.   Radiology    No results  found.  Cardiac Studies   Echo 04/28/2018 LV EF: 30% - 35% Study Conclusions - Left ventricle: The cavity size was normal. There was mild concentric hypertrophy. Systolic function was moderately to severely reduced. The estimated ejection fraction was in the range of 30% to 35%. Features are consistent with a pseudonormal left ventricular filling pattern, with concomitant abnormal relaxation and increased filling pressure (grade 2 diastolic dysfunction). Doppler parameters are consistent with elevated ventricular end-diastolic filling pressure. - Mitral valve: Calcified annulus. Moderately thickened, moderately calcified leaflets . There was mild regurgitation. - Left atrium: The atrium was mildly dilated. - Right ventricle: The cavity size was moderately dilated. Wall thickness was normal. Systolic function was moderately reduced. - Tricuspid valve: There was mild regurgitation. - Pulmonary arteries: Systolic pressure was mildly increased. PA peak pressure: 34 mm Hg (S). - Pericardium, extracardiac: A trivial pericardial effusion was identified. Features were not consistent with tamponade physiology.  Impressions: - When compared to the prior study from 04/17/2017 LVEF has decreased from 55-60% to 30-35% with new wall motion abnormalities - diffuse hypokinesis and akinesis of the basal and mid anteroseptal, anterior and apical septal and anterior walls. RVEF is moderately decreased.   Patient Profile     52 y.o. female with extensive PAD, DM, paroxysmal atrial fib on Xarelto, coronary calcification on CT, chronic back pain,admitted with septic shock06/07/2019due to LLE gangrene with multiorgan dysfunction,small demand NSTEMI (troponinpeak7.7)and new LV dysfunction with drop in EF to 30-35% with new WMA. 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 Shock resolved, but has acute oliguric renal failure requiring dialysis. Also found to have recurrent paroxysmal  atrial fib during admission, amiodarone started due to low BP limiting use of AV node blocking agents.Heparin stopped due to anemia and suspected GI bleeding.  Assessment & Plan    PAF: -CHA2DS2 - VASc score of 4 (female, CHF, PAD/coronary calcification, DM II). Had  post op afib in Jan 2019 admission, was on Xarelto from PVD perspective, but felt this may not be needed long term if no recurrent on outpatient event monitor. It does not appears event monitor was ever done. Currently not anticoagulated due to anemia. On VTE proph heparin.  - Went into afib around 6/20 during this admission again, currently maintaining NSR on amiodarone 400mg  daily (started on daily dosing on 6/20), continue 2 weeks then down to 200mg  daily thereafter.  -currently on metoprolol 12.5 mg bid. Had some hypotension last evening. Once stable BP, will consolidate into Toprol XL 25 mg.   NSTEMI: -Felt to be related to demand ischemia in setting of septic shock.  -No chest pain or dyspnea -Plan cardiac cath once renal function improved or she is ESRD. Prior to cath, will need to make sure hemoglobin stabilize.  New cardiomyopathy -Echo 04/28/2018 EF 30-35%, grade 2 DD, mild MR, mild TR, PA peak pressure 34 mmHg. Previous EF 55-60% on echo 04/17/2017            PAD:  -underwent L AKA by [name] on 04/26/2018  ARF:  -requiring dialysis. Last dialysis 6/22 and is making urine. SCr up today to 4.07 (3.49 yesterday). Followed by nephrology.   Acute on chronic normocytic anemia:  -1 units of PRBC on 6/20, hemoglobin 8.5 today  Nausea and vomiting: -QTc 490 yesterday, zofran switched to phenergan -Still has nausea but no longer vomiting   For questions or updates, please contact [facility] Please consult [email or web address] for contact info under Cardiology/STEMI.      Signed, Daune Perch, NP  05/12/2018, 9:50 AM    Patient examined chart reviewed Exam with left AKA. Telemetry maintaining NSR has had dialysis  and making urine Cr up to 4.0 no plans for cath at this time given acute/chronic renal failure Continue beta blocker and amiodarone Volume regulated with dialysis Hct continues to trend down currently not on anticoagulation for PAF  [employer]

## 2018-05-12 NOTE — Clinical Social Work Note (Signed)
Received call from patient's husband. He asked about bed offers from [facility] and [facility]. They both declined. Next preference is [facility] and they have offered a bed. CSW notified hospital liaison for SNF. CSW made him aware that she is not quite ready for discharge but will keep him updated. Please notify CSW at least 24 hours prior to discharge so insurance authorization can be started.  Dayton Scrape, [place]

## 2018-05-12 NOTE — Progress Notes (Signed)
Patient Demographics:    [name], is a 52 y.o. female, DOB - 06-12-1966, UMP:[id number]  Admit date - 04/24/2018   Admitting Physician [name], MD  Outpatient Primary MD for the patient is [name], MD  LOS - [phone]   Chief Complaint  Patient presents with  . 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 Shock resolved, but has acute oliguric renal failure requiring dialysis. Also found to have recurrent paroxysmal atrial fib  during admission, amiodarone started due to low BP limiting use of AV node blocking agents.Heparin stopped due to anemia and suspected GI bleeding.   Plan:- 1)Acute on chronic Anemia--stable at this time, hemoglobin is 8.5, she received  1 unit of packed cells on 05/07/2018, and Aranesp shot x1 on 05/07/18,  Hemoccult positive, heparin drip has been discontinued by cardiology team, given CAD try to keep hemoglobin at least close to 9, c/n  Aranesp per nephrology team.  No evidence of obvious/active bleeding at this time  2)NSTEMI--suspect due to demand ischemia in the setting of septic shock, echo with reduced EF and new wall motion abnormalities, cardiology input appreciated, IV heparin drip discontinued after 48 hours per cardiology team, c/n metoprolol 12.5 mg twice daily, titrate metoprolol up as BP tolerates, at this time patient unable to tolerate ACE/ARB/Aldactone due to renal concerns, c/n aspirin and crestor  3)AKI/ATN-renal failure in the setting of sepsis with multiorgan failure and persistent hypotension requiring pressors, limited  urine output, hemodialysis was started on 04/29/2018,   no significant renal recovery  4)PAFib--- this is not new, unable to anticoagulate due to #1 above, rate controlled better with amiodarone as ordered by cardiology team , c/n metoprolol 12.5 mg twice daily, titrate metoprolol up as BP tolerates  5)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 by Dr [name]  6)HFrEF--patient with combined systolic and diastolic  dysfunction CHF, echo with EF of 35% with grade 2 diastolic dysfunction, at this time patient unable to tolerate ACE/ARB/Aldactone due to renal concerns, continue to use hemodialysis to address volume status, weight is down with hemodialysis, c/n metoprolol 12.5 mg twice daily, titrate metoprolol up as BP tolerates  7)Developmental Delay/Cognitive Deficits--- at baseline patient has cognitive deficits which have worsened since acute infection, poor  judgment and cognitive concerns persist, poor executive functionining  8)DM2-poor control, A1c 8.4, Use Novolog/Humalog Sliding scale insulin with Accu-Cheks/Fingersticks as ordered  9)Diarrhea--patient reports long-standing history of intermittent diarrhea, C. difficile testing previously negative, diarrhea has improved on  Lomotil ,    Code Status : Full  Disposition Plan  : SNF , need to resolve hemodialysis issues first (pt outpatient hemodialysis arrangements set up prior to discharge)  Consults  :  PCCM/cardiology/nephrology  DVT Prophylaxis  :   Heparin [place]  Lab Results  Component Value Date   PLT 242 05/12/2018    Inpatient Medications  Scheduled Meds: . amiodarone  400 mg Oral Daily  . aspirin  81 mg Oral Daily  . chlorhexidine  15 mL Mouth Rinse BID  . Chlorhexidine Gluconate Cloth  6 each Topical Q0600  . darbepoetin (ARANESP) injection - DIALYSIS  100 mcg Intravenous Q Thu-HD  . diphenoxylate-atropine  2 tablet Oral QID  . feeding supplement (PRO-STAT SUGAR FREE 64)  30 mL Oral TID BM  . ferrous sulfate  325 mg Oral BID WC  . furosemide  80 mg Intravenous BID  . heparin injection (subcutaneous)  5,000 Units Subcutaneous Q8H  . insulin aspart  0-5 Units Subcutaneous QHS  . insulin aspart  0-9 Units Subcutaneous TID WC  . mouth rinse  15 mL Mouth Rinse q12n4p  . metoprolol tartrate  12.5 mg Oral BID  . multivitamin  1 tablet Oral QHS  . pantoprazole  40 mg Oral BID AC  . rosuvastatin  20 mg Oral q1800  . sodium chloride flush  10-40 mL Intracatheter Q12H   Continuous Infusions: . sodium chloride 10 mL/hr at 05/08/18 0027   PRN Meds:.acetaminophen, alteplase, fentaNYL (SUBLIMAZE) injection, heparin, metoprolol tartrate, nitroGLYCERIN, oxyCODONE, phenol, promethazine, sodium chloride flush    Anti-infectives (From admission, onward)   Start     Dose/Rate Route Frequency Ordered Stop   05/05/18 1354  ceFAZolin (ANCEF) 2-4 GM/100ML-% IVPB  Status:  Discontinued     Note to Pharmacy:  Gar Ponto   : cabinet override      05/05/18 1354 05/05/18 1451   05/05/18 0930  ceFAZolin (ANCEF) IVPB 2g/100 mL premix     2 g 200 mL/hr over 30 Minutes Intravenous On call 05/05/18 0924 05/06/18 0930   04/30/18 0900  vancomycin (VANCOCIN) IVPB 750 mg/150 ml premix  Status:  Discontinued     750 mg 150 mL/hr over 60 Minutes Intravenous  Once 04/30/18 0819 04/30/18 0828   04/28/18 1400  cefTAZidime (FORTAZ) 1 g in sodium chloride 0.9 % 100 mL IVPB     1 g 200 mL/hr over 30 Minutes Intravenous Every 24 hours 04/28/18 1301 05/01/18 1416   04/26/18 0600  vancomycin (VANCOCIN) IVPB 1000 mg/200 mL premix  Status:  Discontinued     1,000 mg 200 mL/hr over 60 Minutes Intravenous Every 48 hours 04/26/18 0515 04/28/18 1152   04/25/18 1000  vancomycin (VANCOCIN) IVPB 1000 mg/200 mL premix  Status:  Discontinued     1,000 mg 200 mL/hr over 60 Minutes Intravenous Every 12 hours 04/24/18 2213 04/25/18 0215  04/25/18 0600  piperacillin-tazobactam (ZOSYN) IVPB 3.375 g  Status:  Discontinued     3.375 g 12.5 mL/hr over 240 Minutes Intravenous Every 8 hours 04/24/18 2213 04/25/18 0220   04/25/18 0600  piperacillin-tazobactam (ZOSYN) IVPB 2.25 g  Status:  Discontinued     2.25 g 100 mL/hr over 30 Minutes Intravenous Every 8 hours 04/25/18 0220 04/28/18 1152   04/24/18 2100  piperacillin-tazobactam (ZOSYN) IVPB 3.375 g     3.375 g 100 mL/hr over 30 Minutes Intravenous  Once 04/24/18 2050 04/24/18 2242   04/24/18 2100  vancomycin (VANCOCIN) IVPB 1000 mg/200 mL premix     1,000 mg 200 mL/hr over 60 Minutes Intravenous  Once 04/24/18 2050 04/24/18 2352        Objective:   Vitals:   05/11/18 2011 05/11/18 2317 05/12/18 0300 05/12/18 0800  BP: (!) 89/51 112/61 (!) 107/57 118/62  Pulse: 70 68 68 70  Resp:   14   Temp:   98.1 F (36.7 C)   TempSrc:   Oral   SpO2: 95% 96% 97%   Weight:   87.9 kg (193 lb 12.6 oz)   Height:        Wt Readings from Last 3 Encounters:    05/12/18 87.9 kg (193 lb 12.6 oz)  04/20/18 72.6 kg (160 lb)  03/31/18 79.4 kg (175 lb)     Intake/Output Summary (Last 24 hours) at 05/12/2018 1758 Last data filed at 05/12/2018 1338 Gross per 24 hour  Intake 960 ml  Output 595 ml  Net 365 ml     Physical Exam  Gen:- Awake Alert,  In no apparent distress  HEENT:- [place].AT, No sclera icterus Neck-Supple Neck,No JVD,  Rt IJ HD catheter.  Lungs-  CTAB , good air movement  CV- S1, S2 normal Abd-  +ve B.Sounds, Abd Soft, No tenderness,    Extremity/Skin:- LT AKA intact suture/staple line Psych-affect is appropriate, oriented x3, poor judgment and cognitive concerns persist Neuro-no new focal deficits, no tremors   Data Review:   Micro Results No results found for this or any previous visit (from the past 240 hour(s)).  Radiology Reports US Renal  Result Date: 04/25/2018 CLINICAL DATA:  Initial evaluation for acute renal injury. EXAM: RENAL / URINARY TRACT ULTRASOUND COMPLETE COMPARISON:  Prior CT from 11/30/2017 FINDINGS: Right Kidney: Length: 14.4 cm. Echogenicity within normal limits. No mass or hydronephrosis visualized. Left Kidney: Length: 13.7 cm. Echogenicity within normal limits. No mass or hydronephrosis visualized. Bladder: Bladder decompressed with a Foley catheter in place. IMPRESSION: 1. Negative renal ultrasound.  No hydronephrosis. 2. Bladder decompressed with a Foley catheter in place. Electronically Signed   By: Jeannine Boga M.D.   On: 04/25/2018 06:55   Ir Fluoro Guide Cv Line Right  Result Date: 05/05/2018 INDICATION: Acute kidney injury, no current access for dialysis EXAM: ULTRASOUND GUIDANCE FOR VASCULAR ACCESS RIGHT INTERNAL JUGULAR PERMANENT HEMODIALYSIS CATHETER Date:  05/05/2018 05/05/2018 3:15 pm Radiologist:  Jerilynn Mages. Daryll Brod, MD Guidance:  Ultrasound fluoroscopic FLUOROSCOPY TIME:  Fluoroscopy Time: 0 minutes 24 seconds (2 mGy). MEDICATIONS: Ancef 2 g administered within 1 hour of procedure  ANESTHESIA/SEDATION: Versed 0.5 mg IV; Fentanyl 12.5 mcg IV; Moderate Sedation Time:  18 minutes The patient was continuously monitored during the procedure by the interventional radiology nurse under my direct supervision. CONTRAST:  None. COMPLICATIONS: None immediate. PROCEDURE: Informed consent was obtained from the patient following explanation of the procedure, risks, benefits and alternatives. The patient understands, agrees and consents for the procedure. All  questions were addressed. A time out was performed. Maximal barrier sterile technique utilized including caps, mask, sterile gowns, sterile gloves, large sterile drape, hand hygiene, and 2% chlorhexidine scrub. Under sterile conditions and local anesthesia, right internal jugular micropuncture venous access was performed with ultrasound. Images were obtained for documentation of the patent right internal jugular vein. A guide wire was inserted followed by a transitional dilator. Next, a 0.035 guidewire was advanced into the IVC with a 5-French catheter. Measurements were obtained from the right venotomy site to the proximal right atrium. In the right infraclavicular chest, a subcutaneous tunnel was created under sterile conditions and local anesthesia. 1% lidocaine with epinephrine was utilized for this. The 19 cm tip to cuff dialysis catheter was tunneled subcutaneously to the venotomy site and inserted into the SVC/RA junction through a valved peel-away sheath. Position was confirmed with fluoroscopy. Images were obtained for documentation. Blood was aspirated from the catheter followed by saline and heparin flushes. The appropriate volume and strength of heparin was instilled in each lumen. Caps were applied. The catheter was secured at the tunnel site with Gelfoam and a pursestring suture. The venotomy site was closed with subcuticular Vicryl suture. Dermabond was applied to the small right neck incision. A dry sterile dressing was applied. The  catheter is ready for use. No immediate complications. IMPRESSION: Ultrasound and fluoroscopically guided right internal jugular tunneled hemodialysis catheter (19 cm tip to cuff dialysis catheter). Electronically Signed   By: Jerilynn Mages.  Shick M.D.   On: 05/05/2018 15:25   Ir US Guide Vasc Access Right  Result Date: 05/05/2018 INDICATION: Acute kidney injury, no current access for dialysis EXAM: ULTRASOUND GUIDANCE FOR VASCULAR ACCESS RIGHT INTERNAL JUGULAR PERMANENT HEMODIALYSIS CATHETER Date:  05/05/2018 05/05/2018 3:15 pm Radiologist:  Jerilynn Mages. Daryll Brod, MD Guidance:  Ultrasound fluoroscopic FLUOROSCOPY TIME:  Fluoroscopy Time: 0 minutes 24 seconds (2 mGy). MEDICATIONS: Ancef 2 g administered within 1 hour of procedure ANESTHESIA/SEDATION: Versed 0.5 mg IV; Fentanyl 12.5 mcg IV; Moderate Sedation Time:  18 minutes The patient was continuously monitored during the procedure by the interventional radiology nurse under my direct supervision. CONTRAST:  None. COMPLICATIONS: None immediate. PROCEDURE: Informed consent was obtained from the patient following explanation of the procedure, risks, benefits and alternatives. The patient understands, agrees and consents for the procedure. All questions were addressed. A time out was performed. Maximal barrier sterile technique utilized including caps, mask, sterile gowns, sterile gloves, large sterile drape, hand hygiene, and 2% chlorhexidine scrub. Under sterile conditions and local anesthesia, right internal jugular micropuncture venous access was performed with ultrasound. Images were obtained for documentation of the patent right internal jugular vein. A guide wire was inserted followed by a transitional dilator. Next, a 0.035 guidewire was advanced into the IVC with a 5-French catheter. Measurements were obtained from the right venotomy site to the proximal right atrium. In the right infraclavicular chest, a subcutaneous tunnel was created under sterile conditions and local  anesthesia. 1% lidocaine with epinephrine was utilized for this. The 19 cm tip to cuff dialysis catheter was tunneled subcutaneously to the venotomy site and inserted into the SVC/RA junction through a valved peel-away sheath. Position was confirmed with fluoroscopy. Images were obtained for documentation. Blood was aspirated from the catheter followed by saline and heparin flushes. The appropriate volume and strength of heparin was instilled in each lumen. Caps were applied. The catheter was secured at the tunnel site with Gelfoam and a pursestring suture. The venotomy site was closed with subcuticular Vicryl suture. Dermabond  was applied to the small right neck incision. A dry sterile dressing was applied. The catheter is ready for use. No immediate complications. IMPRESSION: Ultrasound and fluoroscopically guided right internal jugular tunneled hemodialysis catheter (19 cm tip to cuff dialysis catheter). Electronically Signed   By: Jerilynn Mages.  Shick M.D.   On: 05/05/2018 15:25   Dg Chest Port 1 View  Result Date: 05/02/2018 CLINICAL DATA:  Pulmonary edema. EXAM: PORTABLE CHEST 1 VIEW COMPARISON:  05/01/2018. FINDINGS: Endotracheal tube and nasogastric tube have been removed. Right IJ central line tip projects over the SVC. Left IJ central line tip also projects over the SVC. Heart size stable. Mild diffuse interstitial prominence and indistinctness with improvement in aeration in the lung bases. Small bilateral pleural effusions. IMPRESSION: Improving congestive heart failure. Electronically Signed   By: Lorin Picket M.D.   On: 05/02/2018 07:18   Dg Chest Port 1 View  Result Date: 05/01/2018 CLINICAL DATA:  Respiratory failure. EXAM: PORTABLE CHEST 1 VIEW COMPARISON:  04/30/2018. FINDINGS: Endotracheal tube, NG tube, right IJ line stable position. Stable cardiomegaly persistent bibasilar infiltrates/edema and small bilateral pleural effusions. No interim change. No pneumothorax. IMPRESSION: 1.  Lines and tubes  in stable position. 2. Persistent bibasilar infiltrates/edema and small bilateral pleural effusions. No significant interim change. 3.  Stable cardiomegaly. Electronically Signed   By: Marcello Moores  Register   On: 05/01/2018 06:25   Dg Chest Port 1 View  Result Date: 04/30/2018 CLINICAL DATA:  Intubation EXAM: PORTABLE CHEST 1 VIEW COMPARISON:  04/29/2018 FINDINGS: Endotracheal tube in good position. NG tube in the stomach. Central venous catheter tip in the lower SVC unchanged. No pneumothorax Cardiac enlargement. Diffuse bilateral airspace disease has progressed, probable edema. Progression of bibasilar atelectasis and small pleural effusions bilaterally. IMPRESSION: Support lines remain in good position Worsening bilateral airspace disease most consistent with pulmonary edema. Progression of bibasilar atelectasis and bilateral effusions. Electronically Signed   By: Franchot Gallo M.D.   On: 04/30/2018 07:16   Dg Chest Port 1 View  Result Date: 04/29/2018 CLINICAL DATA:  Hypoxia EXAM: PORTABLE CHEST 1 VIEW COMPARISON:  April 28, 2018 FINDINGS: Endotracheal tube tip is 2.1 cm above the carina. Right central catheter tip is in the superior vena cava. Left central catheter tip is at the junction of the left innominate vein and superior vena cava, stable. Nasogastric tube tip and side port below the diaphragm. No pneumothorax. There is bibasilar atelectasis with small pleural effusion on the right. No consolidation. Heart is mildly enlarged with pulmonary vascularity normal. No adenopathy. No bone lesions. IMPRESSION: Tube and catheter positions as described without pneumothorax. Mild bibasilar atelectasis. Small right pleural effusion. No consolidation. Stable cardiac prominence. Electronically Signed   By: Lowella Grip III M.D.   On: 04/29/2018 07:34   Dg Chest Port 1 View  Result Date: 04/28/2018 CLINICAL DATA:  ETT placement and HD catheter placement EXAM: PORTABLE CHEST 1 VIEW COMPARISON:  04/27/2018.  FINDINGS: Cardiomegaly. HD catheter tip distal SVC. ET tube tip 3.3 cm above carina. BILATERAL pulmonary opacities, RIGHT worse than LEFT, consistent with pulmonary edema. Central venous catheter tip unchanged from LEFT IJ approach, proximal SVC. No pneumothorax. IMPRESSION: Cardiomegaly with pulmonary edema may be minimally worse. ET tube and HD catheter appear in satisfactory position. No pneumothorax. Electronically Signed   By: Staci Righter M.D.   On: 04/28/2018 16:57   Dg Chest Port 1 View  Result Date: 04/27/2018 CLINICAL DATA:  Acute respiratory failure EXAM: PORTABLE CHEST 1 VIEW COMPARISON:  04/25/2018 FINDINGS:  Bilateral mild interstitial thickening with patchy alveolar airspace opacities at bilateral lung bases. No pleural effusion or pneumothorax. Stable cardiomediastinal silhouette. Left IJ central venous catheter with the tip projecting over the SVC in unchanged position. No acute osseous abnormality. IMPRESSION: 1. Bilateral interstitial thickening concerning for mild interstitial edema versus interstitial infection. Electronically Signed   By: Kathreen Devoid   On: 04/27/2018 08:37   Dg Chest Port 1 View  Result Date: 04/25/2018 CLINICAL DATA:  Central line placement. EXAM: PORTABLE CHEST 1 VIEW COMPARISON:  Chest radiograph performed 04/24/2018 FINDINGS: The patient's left the IJ line is noted ending about the proximal SVC. Mild vascular congestion is noted. Mild bibasilar atelectasis is seen. No pleural effusion or pneumothorax is seen, though the lung bases are incompletely characterized. The cardiomediastinal silhouette is borderline normal in size. No acute osseous abnormalities are identified. IMPRESSION: 1. Left IJ line noted ending about the proximal SVC. No pneumothorax. 2. Mild vascular congestion noted; mild bibasilar atelectasis seen. Electronically Signed   By: Garald Balding M.D.   On: 04/25/2018 03:57   Dg Chest Portable 1 View  Result Date: 04/24/2018 CLINICAL DATA:  Initial  evaluation for acute sepsis. EXAM: PORTABLE CHEST 1 VIEW COMPARISON:  Prior radiograph from 12/02/2017. FINDINGS: Moderate cardiomegaly, stable. Mediastinal silhouette within normal limits. No focal infiltrates. No pulmonary edema or visible pleural effusion. No pneumothorax. Osseous structures within normal limits. IMPRESSION: 1. No active cardiopulmonary disease. 2. Stable cardiomegaly without pulmonary edema. Electronically Signed   By: Jeannine Boga M.D.   On: 04/24/2018 22:19     CBC Recent Labs  Lab 05/08/18 0418 05/09/18 0641 05/10/18 0710 05/11/18 0626 05/12/18 0531  WBC 13.2* 13.0* 12.6* 10.4 9.3  HGB 7.9* 9.0* 9.3* 8.7* 8.5*  HCT 26.5* 30.0* 31.1* 29.9* 28.9*  PLT 176 211 213 242 242  MCV 83.1 84.5 85.0 86.9 88.4  MCH 24.8* 25.4* 25.4* 25.3* 26.0  MCHC 29.8* 30.0 29.9* 29.1* 29.4*  RDW 18.1* 18.9* 19.8* 20.8* 21.4*    Chemistries  Recent Labs  Lab 05/06/18 0345  05/08/18 0418 05/09/18 0641 05/09/18 0743 05/10/18 1514 05/11/18 0626 05/12/18 0531  NA 138  137   < > 138 137 137 138 139 139  K 4.3  4.3   < > 3.8 3.9 3.8 4.0 4.2 4.4  CL 102  102   < > 104 102 102 103 102 100  CO2 26  25   < > 27 25 27 30 27 30   GLUCOSE 191*  189*   < > 126* 110* 112* 148* 105* 130*  BUN 52*  53*   < > 16 25* 25* 16 20 21*  CREATININE 6.65*  6.58*   < > 3.23* 3.86* 3.65* 3.13* 3.49* 4.07*  CALCIUM 8.4*  8.4*   < > 8.2* 8.6* 8.4* 8.1* 8.1* 8.2*  MG  --    < > 1.8 1.7  --  1.7 1.6* 2.1  AST 11*  --   --   --   --   --   --   --   ALT 10*  --   --   --   --   --   --   --   ALKPHOS 88  --   --   --   --   --   --   --   BILITOT 0.2*  --   --   --   --   --   --   --    < > =  values in this interval not displayed.   ------------------------------------------------------------------------------------------------------------------ No results for input(s): CHOL, HDL, LDLCALC, TRIG, CHOLHDL, LDLDIRECT in the last 72 hours.  Lab Results  Component Value Date   HGBA1C 8.4  (H) 03/24/2018   ------------------------------------------------------------------------------------------------------------------ No results for input(s): TSH, T4TOTAL, T3FREE, THYROIDAB in the last 72 hours.  Invalid input(s): FREET3 ------------------------------------------------------------------------------------------------------------------ No results for input(s): VITAMINB12, FOLATE, FERRITIN, TIBC, IRON, RETICCTPCT in the last 72 hours.  Coagulation profile No results for input(s): INR, PROTIME in the last 168 hours.  No results for input(s): DDIMER in the last 72 hours.  Cardiac Enzymes No results for input(s): CKMB, TROPONINI, MYOGLOBIN in the last 168 hours.  Invalid input(s): CK ------------------------------------------------------------------------------------------------------------------ No results found for: BNP   Roxan Hockey M.D on 05/12/2018 at 5:58 PM  Between 7am to 7pm - Pager - [phone]  After 7pm go to [email or web address] - password [email or web address]  [name] -  Office  [phone]   Voice Recognition Viviann Spare dictation system was used to create this note, attempts have been made to correct errors. Please contact the author with questions and/or clarifications.

## 2018-05-12 NOTE — Progress Notes (Signed)
[facility] NEPHROLOGY PROGRESS NOTE  Assessment/ Plan: Pt is a 52 y.o. yo female with history of diabetes, peripheral artery disease, A. fib, admitted with septic shock due to left lower extremity gangrenous with multiorgan dysfunction, non-STEMI, EF of 30 to 35%.  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  Developed and anuric AKI requiring intermittent dialysis.  Assessment/Plan:  #Oliguric AKI from ATN in the setting of septic shock: Started dialysis on 6/12, tunneled catheter was placed with IR on 6/19.  Last dialysis was on 6/22.  -Urine output of 320 cc in 24-hour.  Serum creatinine level elevated to 4, potassium level acceptable.  I will hold off on dialysis today.  She has pitting edema up to thigh, I will start Lasix 80 mg IV twice a day.  Monitor BMP, urine output and watch for renal recovery. -Discussed with the primary team.  #Septic shock: Status post antibiotics, stress dose steroid, resolved now.  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)  # NSTEMI; may need cath, new RWMA on echo. Monitor kidney function.  # h/o Afib with RVR  Subjective: Seen and examined at bedside.  Has weakness.  Denied nausea, vomiting, chest pain or shortness of breath. Objective Vital signs in last 24 hours: Vitals:   05/11/18 2011 05/11/18 2317 05/12/18 0300 05/12/18 0800  BP: (!) 89/51 112/61 (!) 107/57 118/62  Pulse: 70 68 68 70  Resp:   14   Temp:   98.1 F (36.7 C)   TempSrc:   Oral   SpO2: 95% 96% 97%   Weight:   87.9 kg (193 lb 12.6 oz)   Height:       Weight change: -0.3 kg (-10.6 oz)  Intake/Output Summary (Last 24 hours) at 05/12/2018 1618 Last data filed at 05/12/2018 1338 Gross per 24 hour  Intake 960 ml  Output 595 ml  Net 365 ml       Labs: Basic Metabolic Panel: Recent Labs  Lab 05/07/18 0418  05/09/18 0743 05/10/18 1514 05/11/18 0626 05/12/18 0531  NA 136   < > 137 138 139 139  K 3.6   < > 3.8 4.0 4.2 4.4  CL 102   < >  102 103 102 100  CO2 26   < > 27 30 27 30   GLUCOSE 113*   < > 112* 148* 105* 130*  BUN 32*   < > 25* 16 20 21*  CREATININE 4.69*   < > 3.65* 3.13* 3.49* 4.07*  CALCIUM 7.8*   < > 8.4* 8.1* 8.1* 8.2*  PHOS 4.8*  --  5.2*  --   --  3.8   < > = values in this interval not displayed.   Liver Function Tests: Recent Labs  Lab 05/06/18 0345 05/09/18 0743 05/12/18 0531  AST 11*  --   --   ALT 10*  --   --   ALKPHOS 88  --   --   BILITOT 0.2*  --   --   PROT 5.4*  --   --   ALBUMIN 1.9*  1.9* 1.9* 1.9*   No results for input(s): LIPASE, AMYLASE in the last 168 hours. Recent Labs  Lab 05/06/18 0347  AMMONIA <9*   CBC: Recent Labs  Lab 05/08/18 0418 05/09/18 0641 05/10/18 0710 05/11/18 0626 05/12/18 0531  WBC 13.2* 13.0* 12.6* 10.4 9.3  HGB 7.9* 9.0* 9.3* 8.7* 8.5*  HCT 26.5* 30.0* 31.1* 29.9* 28.9*  MCV 83.1 84.5 85.0 86.9  88.4  PLT 176 211 213 242 242   Cardiac Enzymes: No results for input(s): CKTOTAL, CKMB, CKMBINDEX, TROPONINI in the last 168 hours. CBG: Recent Labs  Lab 05/11/18 1130 05/11/18 1651 05/11/18 2047 05/12/18 0736 05/12/18 1142  GLUCAP 127* 152* 156* 120* 128*    Iron Studies: No results for input(s): IRON, TIBC, TRANSFERRIN, FERRITIN in the last 72 hours. Studies/Results: No results found.  Medications: Infusions: . sodium chloride 10 mL/hr at 05/08/18 9432    Scheduled Medications: . amiodarone  400 mg Oral Daily  . aspirin  81 mg Oral Daily  . chlorhexidine  15 mL Mouth Rinse BID  . Chlorhexidine Gluconate Cloth  6 each Topical Q0600  . darbepoetin (ARANESP) injection - DIALYSIS  100 mcg Intravenous Q Thu-HD  . diphenoxylate-atropine  2 tablet Oral QID  . feeding supplement (PRO-STAT SUGAR FREE 64)  30 mL Oral TID BM  . ferrous sulfate  325 mg Oral BID WC  . furosemide  80 mg Intravenous BID  . heparin injection (subcutaneous)  5,000 Units Subcutaneous Q8H  . insulin aspart  0-5 Units Subcutaneous QHS  . insulin aspart  0-9 Units  Subcutaneous TID WC  . mouth rinse  15 mL Mouth Rinse q12n4p  . metoprolol tartrate  12.5 mg Oral BID  . multivitamin  1 tablet Oral QHS  . pantoprazole  40 mg Oral BID AC  . rosuvastatin  20 mg Oral q1800  . sodium chloride flush  10-40 mL Intracatheter Q12H    have reviewed scheduled and prn medications.  Physical Exam: General: Not in distress, comfortable Heart: Regular rate rhythm, S1-S2 normal Lungs: Bibasilar decreased breath sound, no wheezing Abdomen:soft, Non-tender, non-distended Extremities: Edema up to thigh. Dialysis Access:RIJ TDC  [name] 05/12/2018,4:18 PM  LOS: 17 days

## 2018-05-13 LAB — CBC
HEMATOCRIT: 27.1 % — AB (ref 36.0–46.0)
Hemoglobin: 7.9 g/dL — ABNORMAL LOW (ref 12.0–15.0)
MCH: 25.5 pg — AB (ref 26.0–34.0)
MCHC: 29.2 g/dL — ABNORMAL LOW (ref 30.0–36.0)
MCV: 87.4 fL (ref 78.0–100.0)
PLATELETS: 219 10*3/uL (ref 150–400)
RBC: 3.1 MIL/uL — ABNORMAL LOW (ref 3.87–5.11)
RDW: 21.9 % — AB (ref 11.5–15.5)
WBC: 8.9 10*3/uL (ref 4.0–10.5)

## 2018-05-13 LAB — GLUCOSE, CAPILLARY
GLUCOSE-CAPILLARY: 110 mg/dL — AB (ref 70–99)
GLUCOSE-CAPILLARY: 131 mg/dL — AB (ref 70–99)
GLUCOSE-CAPILLARY: 57 mg/dL — AB (ref 70–99)
GLUCOSE-CAPILLARY: 158 mg/dL — AB (ref 70–99)
Glucose-Capillary: 117 mg/dL — ABNORMAL HIGH (ref 70–99)

## 2018-05-13 LAB — RENAL FUNCTION PANEL
Albumin: 1.8 g/dL — ABNORMAL LOW (ref 3.5–5.0)
Anion gap: 9 (ref 5–15)
BUN: 25 mg/dL — ABNORMAL HIGH (ref 6–20)
CALCIUM: 8 mg/dL — AB (ref 8.9–10.3)
CO2: 27 mmol/L (ref 22–32)
CREATININE: 4.54 mg/dL — AB (ref 0.44–1.00)
Chloride: 101 mmol/L (ref 98–111)
GFR calc Af Amer: 12 mL/min — ABNORMAL LOW (ref >60)
GFR calc non Af Amer: 10 mL/min — ABNORMAL LOW (ref >60)
Glucose, Bld: 118 mg/dL — ABNORMAL HIGH (ref 70–99)
PHOSPHORUS: 4.1 mg/dL (ref 2.5–4.6)
Potassium: 4.4 mmol/L (ref 3.5–5.1)
Sodium: 137 mmol/L (ref 135–145)

## 2018-05-13 LAB — MAGNESIUM: Magnesium: 1.9 mg/dL (ref 1.7–2.4)

## 2018-05-13 NOTE — Progress Notes (Signed)
Pt was seen for more bed mob and is fearful of standing attempts.  Her plan is to work on the progression of transfers, to sit and stand with better balance control and to provide support as needed to her efforts.    05/11/18 1200  PT Visit Information  Assistance Needed +2  History of Present Illness 52 year old female with PMH of DM, PAD, A.Fib on Xarelto, Chronic Wounds of Left Foot with H/O of ABF and Fem-Pop Bypass. Recent Admission 5/10-5/14 for redo of Left femoral to PTA bypass with cryopreserved vein. Post-Op patient lost pulse to left foot and was taken back to OR for thrombectomy. On 6/3 patient went to outpatient vascular appointment where she was found to by hypotensive and febrile with a occlusion noted to bypass graft. 6/7 patient presented to ED with AMS with pain to left lower leg and emesis and diarrhea. 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  Course has been complicated by AKI and NSTEMI.  Now requiring intermittent HD. ETT 6/11-6/14.   Subjective Data  Patient Stated Goal to get up and stand  Precautions  Precautions Fall  Precaution Comments telemetry  Restrictions  Weight Bearing Restrictions Yes  Other Position/Activity Restrictions NWB LLE at residual limb  Pain Assessment  Pain Assessment Faces  Faces Pain Scale 8  Pain Location L leg and back  Pain Descriptors / Indicators Headache;Grimacing  Pain Intervention(s) Monitored during session;Limited activity within patient's tolerance;Repositioned  Cognition  Arousal/Alertness Awake/alert  Behavior During Therapy WFL for tasks assessed/performed  Overall Cognitive Status No family/caregiver present to determine baseline cognitive functioning  General Comments Pt distracted by pain this session and required increased encouragement  Bed Mobility  Overal bed mobility Needs Assistance  Bed Mobility Supine to Sit  Supine to sit Mod assist;+2 for physical assistance;HOB elevated  General bed mobility comments Mod A to  facilitate hips towards EOB and then elevate trunk  Transfers  Overall transfer level Needs assistance  Equipment used 2 person hand held assist  Transfers Sit to/from [employer] Transfers  Sit to Stand Max assist;+2 physical assistance;From elevated surface  Stand pivot transfers Max assist;+2 physical assistance  General transfer comment Max A +2 for sit<>stand x2 with right knee blocked. Using Max A and use of bed pad to facilitate hip movement to recliner  Balance  Overall balance assessment Needs assistance  Sitting-balance support Feet supported;Single extremity supported  Sitting balance-Leahy Scale Fair  Sitting balance - Comments Able to maintain static standing.  Standing balance support Bilateral upper extremity supported;During functional activity  Standing balance-Leahy Scale Poor  Standing balance comment Reliant on physical A    Mee Hives, PT MS Acute Rehab Dept. Number: [place] [facility] [name]

## 2018-05-13 NOTE — Progress Notes (Signed)
Progress Note  Patient Name: [name] Date of Encounter: 05/13/2018  Primary Cardiologist: Nelva Bush, MD   Subjective   The patient is lying almost flat in bed with no dyspnea.  She denies chest discomfort or shortness of breath.  She continues to have mild nausea, no vomiting today.  She says that she has chronic stomach issues.  Inpatient Medications    Scheduled Meds: . amiodarone  400 mg Oral Daily  . aspirin  81 mg Oral Daily  . chlorhexidine  15 mL Mouth Rinse BID  . Chlorhexidine Gluconate Cloth  6 each Topical Q0600  . darbepoetin (ARANESP) injection - DIALYSIS  100 mcg Intravenous Q Thu-HD  . diphenoxylate-atropine  2 tablet Oral QID  . feeding supplement (PRO-STAT SUGAR FREE 64)  30 mL Oral TID BM  . ferrous sulfate  325 mg Oral BID WC  . furosemide  80 mg Intravenous BID  . heparin injection (subcutaneous)  5,000 Units Subcutaneous Q8H  . insulin aspart  0-5 Units Subcutaneous QHS  . insulin aspart  0-9 Units Subcutaneous TID WC  . mouth rinse  15 mL Mouth Rinse q12n4p  . metoprolol tartrate  12.5 mg Oral BID  . multivitamin  1 tablet Oral QHS  . pantoprazole  40 mg Oral BID AC  . rosuvastatin  20 mg Oral q1800  . sodium chloride flush  10-40 mL Intracatheter Q12H   Continuous Infusions: . sodium chloride 10 mL/hr at 05/08/18 0027   PRN Meds: acetaminophen, alteplase, fentaNYL (SUBLIMAZE) injection, heparin, metoprolol tartrate, nitroGLYCERIN, oxyCODONE, phenol, promethazine, sodium chloride flush   Vital Signs    Vitals:   05/12/18 2127 05/12/18 2328 05/13/18 0418 05/13/18 0800  BP: 94/72 (!) 97/51 (!) 103/56 128/61  Pulse: 72 73 63 72  Resp:    20  Temp:  98.7 F (37.1 C) 98.9 F (37.2 C) 98.1 F (36.7 C)  TempSrc:  Oral Oral Oral  SpO2:  99% 93% 94%  Weight:   193 lb 5.5 oz (87.7 kg)   Height:        Intake/Output Summary (Last 24 hours) at 05/13/2018 0836 Last data filed at 05/13/2018 0807 Gross per 24 hour  Intake 240 ml  Output  975 ml  Net -735 ml   Filed Weights   05/11/18 0634 05/12/18 0300 05/13/18 0418  Weight: 194 lb 7.1 oz (88.2 kg) 193 lb 12.6 oz (87.9 kg) 193 lb 5.5 oz (87.7 kg)    Telemetry    Sinus rhythm with rates 60s-70s and occasional PVCs- Personally Reviewed  ECG    No new tracings- Personally Reviewed  Physical Exam   GEN: No acute distress.  Pale, chronically ill-appearing female Neck: No JVD Cardiac: RRR, no murmurs, rubs, or gallops.  Respiratory: Clear to auscultation bilaterally. GI: Soft, nontender, non-distended  MS:  Tight edema up to the knee on the right; left AKA Neuro:  Nonfocal  Psych: Normal affect   Labs    Chemistry Recent Labs  Lab 05/09/18 0743  05/11/18 0626 05/12/18 0531 05/13/18 0636  NA 137   < > 139 139 137  K 3.8   < > 4.2 4.4 4.4  CL 102   < > 102 100 101  CO2 27   < > 27 30 27   GLUCOSE 112*   < > 105* 130* 118*  BUN 25*   < > 20 21* 25*  CREATININE 3.65*   < > 3.49* 4.07* 4.54*  CALCIUM 8.4*   < > 8.1*  8.2* 8.0*  ALBUMIN 1.9*  --   --  1.9* 1.8*  GFRNONAA 13*   < > 14* 11* 10*  GFRAA 15*   < > 16* 13* 12*  ANIONGAP 8   < > 10 9 9    < > = values in this interval not displayed.     Hematology Recent Labs  Lab 05/11/18 0626 05/12/18 0531 05/13/18 0635  WBC 10.4 9.3 8.9  RBC 3.44* 3.27* 3.10*  HGB 8.7* 8.5* 7.9*  HCT 29.9* 28.9* 27.1*  MCV 86.9 88.4 87.4  MCH 25.3* 26.0 25.5*  MCHC 29.1* 29.4* 29.2*  RDW 20.8* 21.4* 21.9*  PLT 242 242 219    Cardiac EnzymesNo results for input(s): TROPONINI in the last 168 hours. No results for input(s): TROPIPOC in the last 168 hours.   BNPNo results for input(s): BNP, PROBNP in the last 168 hours.   DDimer No results for input(s): DDIMER in the last 168 hours.   Radiology    No results found.  Cardiac Studies   Echo 04/28/2018 LV EF: 30% - 35% Study Conclusions - Left ventricle: The cavity size was normal. There was mild concentric hypertrophy. Systolic function was moderately  to severely reduced. The estimated ejection fraction was in the range of 30% to 35%. Features are consistent with a pseudonormal left ventricular filling pattern, with concomitant abnormal relaxation and increased filling pressure (grade 2 diastolic dysfunction). Doppler parameters are consistent with elevated ventricular end-diastolic filling pressure. - Mitral valve: Calcified annulus. Moderately thickened, moderately calcified leaflets . There was mild regurgitation. - Left atrium: The atrium was mildly dilated. - Right ventricle: The cavity size was moderately dilated. Wall thickness was normal. Systolic function was moderately reduced. - Tricuspid valve: There was mild regurgitation. - Pulmonary arteries: Systolic pressure was mildly increased. PA peak pressure: 34 mm Hg (S). - Pericardium, extracardiac: A trivial pericardial effusion was identified. Features were not consistent with tamponade physiology.  Impressions: - When compared to the prior study from 04/17/2017 LVEF has decreased from 55-60% to 30-35% with new wall motion abnormalities - diffuse hypokinesis and akinesis of the basal and mid anteroseptal, anterior and apical septal and anterior walls. RVEF is moderately decreased.   Patient Profile     52 y.o. female with extensive PAD, DM, paroxysmal atrial fib on Xarelto, coronary calcification on CT, chronic back pain,admitted with septic shock06/07/2019due to LLE gangrene with multiorgan dysfunction,small demand NSTEMI (troponinpeak7.7)and new LV dysfunction with drop in EF to 30-35% with new WMA. 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 Shock resolved, but has acute oliguric renal failure requiring dialysis. Also found to have recurrent paroxysmal atrial fib during admission, amiodarone started due to low BP limiting use of AV node blocking agents.Heparin stopped due to anemia and suspected GI bleeding.  Assessment & Plan     PAF: -CHA2DS2 -  VASc score of 4(female, CHF, PAD/coronary calcification, DM II). Had post op afib in Jan 2019 admission, was on Xarelto from PVD perspective, but felt this may not be needed long term if no recurrent on outpatient event monitor. It does not appears event monitor was ever done. Currently not anticoagulated due to anemia. On VTE proph heparin.  - Went into afib around 6/20 during this admission again, currently maintaining NSR on amiodarone 400mg  daily (started on daily dosing on 6/20), continue 2 weeks then down to 200mg  daily thereafter.  -currently on metoprolol 12.5 mg bid. Had some hypotension last evening. Metoprolol held last night for BP 94/72. Once stable BP, will consolidate into  Toprol XL 25 mg.   NSTEMI: -Felt to be related to demand ischemia in setting of septic shock.  -No chest pain or dyspnea -Plan cardiac cath once renal function improved or she is ESRD. Prior to cath, will need to make sure hemoglobin stabilize.  Hemoglobin has been trending down, 7.9 today.  New cardiomyopathy -Echo 04/28/2018 EF 30-35%, grade 2 DD, mild MR, mild TR, PA peak pressure 34 mmHg. Previous EF 55-60% on echo 04/17/2017 -Currently on lasix 80 mg bid nephrology.  Patient has had increased urine output of 975 cc over the last 24 hours.  PAD:  -underwent L AKA by [name] on 04/26/2018.  On aspirin and statin.  ARF:  -Oliguric AKI from ATN in the setting of septic shock.  Started on dialysis 6/12. Last dialysis 6/22 and is making urine. SCr up today to 4.54, has been trending up (4.07 yesterday). Followed by nephrology.  -Lasix 80 mg IV twice daily started by nephrology with increased urine output.  Watching for renal recovery.  No plans for another dialysis treatment, nephrology is watching closely.  Acute on chronic normocytic anemia:  -1 units of PRBC on 6/20, hemoglobin is slowly trending down, 7.9 today. -Management per internal medicine  Nausea and vomiting: -QTc 490 yesterday, zofran  switched to phenergan -Still has some nausea but no longer vomiting  Hyperlipidemia -On Crestor 20 mg daily   For questions or updates, please contact [place] Please consult [email or web address] for contact info under Cardiology/STEMI.      Signed, Daune Perch, NP  05/13/2018, 8:36 AM

## 2018-05-13 NOTE — Progress Notes (Signed)
Occupational Therapy Treatment Patient Details Name: [name] MRN: [id number] DOB: 1966/07/28 Today's Date: 05/13/2018    History of present illness 52 year old female with PMH of DM, PAD, A.Fib on Xarelto, Chronic Wounds of Left Foot with H/O of ABF and Fem-Pop Bypass. Recent Admission 5/10-5/14 for redo of Left femoral to PTA bypass with cryopreserved vein. Post-Op patient lost pulse to left foot and was taken back to OR for thrombectomy. On 6/3 patient went to outpatient vascular appointment where she was found to by hypotensive and febrile with a occlusion noted to bypass graft. 6/7 patient presented to ED with AMS with pain to left lower leg and emesis and diarrhea. 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  Course has been complicated by AKI and NSTEMI.  Now requiring intermittent HD. ETT 6/11-6/14.    OT comments  Upon arrival, pt supine in bed with call light going off and reporting she needs to have a BM. Pt having already started having BM and required Min A to manage LLE during rolling and Max A for toilet hygiene to clean before placing on bed pan. Pt tearful about waiting for assistance to be placed on bed pan. Pt requesting time to finish BM. Notified RN and NT that pt is on bed pan and will need a bath/linens changed. Will continue to follow acutely as admitted and continue to recommend dc to post-acute rehab.   Follow Up Recommendations  SNF    Equipment Recommendations  Other (comment)(Defer to next venue)    Recommendations for Other Services PT consult    Precautions / Restrictions Precautions Precautions: Fall Precaution Comments: telemetry Restrictions Weight Bearing Restrictions: Yes Other Position/Activity Restrictions: NWB LLE at residual limb       Mobility Bed Mobility Overal bed mobility: Needs Assistance Bed Mobility: Rolling Rolling: Min assist         General bed mobility comments: Min A to roll for manage LLE  Transfers                       Balance                                           ADL either performed or assessed with clinical judgement   ADL Overall ADL's : Needs assistance/impaired                             Toileting- Clothing Manipulation and Hygiene: Maximal assistance;Bed level Toileting - Clothing Manipulation Details (indicate cue type and reason): Pt requiring Min A to manage LLE for rolling to be placed on bed pad. Pt requiring Max A to performing toilet hygiene to clean up after BM in bed before placing on bed pan.       General ADL Comments: Upon arrival, pt stating she had been waiting to be placed on bed pan. Pt agreeable to attempt lateral scoot to [facility], but stating she thinks she has already had BM in bed. Pt requiring Min A to manage LLE during roll to perform toilet hygiene and place in bed pan. Pt crying at end of session because she is upset she soiled herself in bed. Upon checking call system, pt has been waiting 10 minutes before OT arrival.      Vision       Perception  Praxis      Cognition Arousal/Alertness: Awake/alert Behavior During Therapy: WFL for tasks assessed/performed Overall Cognitive Status: No family/caregiver present to determine baseline cognitive functioning                                 General Comments: Upon arrival, pt verablizing she had waiting and called for assistance for the bed pan for awhile. During toileting at bed level, pt becoming tearful stating "I don't want to poop on myself."        Exercises     Shoulder Instructions       General Comments      Pertinent Vitals/ Pain       Pain Assessment: Faces Faces Pain Scale: Hurts even more Pain Location: L leg and back Pain Descriptors / Indicators: Headache;Grimacing Pain Intervention(s): Monitored during session;Limited activity within patient's tolerance;Repositioned  Home Living                                           Prior Functioning/Environment              Frequency  Min 2X/week        Progress Toward Goals  OT Goals(current goals can now be found in the care plan section)  Progress towards OT goals: Progressing toward goals  Acute Rehab OT Goals Patient Stated Goal: to get up and stand OT Goal Formulation: With patient Time For Goal Achievement: 05/23/18 Potential to Achieve Goals: Good ADL Goals Pt Will Perform Upper Body Dressing: with set-up;with supervision;sitting Pt Will Perform Lower Body Dressing: sitting/lateral leans;bed level;with min guard assist Pt Will Transfer to Toilet: with min assist;with transfer board;bedside commode Pt Will Perform Toileting - Clothing Manipulation and hygiene: with min guard assist;sitting/lateral leans;bed level Additional ADL Goal #1: Pt will perform bed mobility with supervision in preparation for ADLs  Plan Discharge plan remains appropriate    Co-evaluation                 AM-PAC PT "6 Clicks" Daily Activity     Outcome Measure   Help from another person eating meals?: A Little Help from another person taking care of personal grooming?: A Little Help from another person toileting, which includes using toliet, bedpan, or urinal?: A Lot Help from another person bathing (including washing, rinsing, drying)?: A Lot Help from another person to put on and taking off regular upper body clothing?: A Little Help from another person to put on and taking off regular lower body clothing?: A Lot 6 Click Score: 15    End of Session Equipment Utilized During Treatment: Other (comment)(Bed pan)  OT Visit Diagnosis: Unsteadiness on feet (R26.81);Other abnormalities of gait and mobility (R26.89);Muscle weakness (generalized) (M62.81);Pain;Other symptoms and signs involving cognitive function Pain - Right/Left: Left Pain - part of body: Leg   Activity Tolerance Patient tolerated treatment well   Patient Left with call bell/phone  within reach;in bed   Nurse Communication Mobility status;Other (comment)(BM in bed and needs bath after finish toileting)        Time: 5784-6962 OT Time Calculation (min): 8 min  Charges: OT General Charges $OT Visit: 1 Visit OT Treatments $Self Care/Home Management : 8-22 mins  [place], OTR/L Acute Rehab Pager: [phone] Office: [place] 05/13/2018, 1:35 PM

## 2018-05-13 NOTE — Progress Notes (Signed)
[facility] NEPHROLOGY PROGRESS NOTE  Assessment/ Plan: Pt is a 52 y.o. yo female with history of diabetes, peripheral artery disease, A. fib, admitted with septic shock due to left lower extremity gangrenous with multiorgan dysfunction, non-STEMI, EF of 30 to 35%.  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  Developed and anuric AKI requiring intermittent dialysis.  Assessment/Plan:  #Oliguric AKI from ATN in the setting of septic shock: Started dialysis on 6/12, tunneled catheter was placed with IR on 6/19.  Last dialysis was on 6/22.  -Patient was a started on IV Lasix since 6/25 with improvement in urine output to 975 cc in 24 hours.  Serum creatinine level elevated.  Plan to continue diuretics with close monitoring of urine output and labs.  No plan for dialysis today.  Daily assessment for the need of dialysis. -watch for renal recovery. -Discussed with the primary team.  #Septic shock: Status post antibiotics, stress dose steroid, resolved now.  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)  # NSTEMI; may need cath, new RWMA on echo. Monitor kidney function.  # h/o Afib with RVR  Subjective: Seen and examined at bedside.  Improvement in urine output.  No chest pain, shortness of breath, nausea or vomiting. Objective Vital signs in last 24 hours: Vitals:   05/12/18 2328 05/13/18 0418 05/13/18 0800 05/13/18 1055  BP: (!) 97/51 (!) 103/56 128/61 (!) 115/59  Pulse: 73 63 72 68  Resp:   20   Temp: 98.7 F (37.1 C) 98.9 F (37.2 C) 98.1 F (36.7 C)   TempSrc: Oral Oral Oral   SpO2: 99% 93% 94%   Weight:  87.7 kg (193 lb 5.5 oz)    Height:       Weight change: -0.2 kg (-7.1 oz)  Intake/Output Summary (Last 24 hours) at 05/13/2018 1125 Last data filed at 05/13/2018 0807 Gross per 24 hour  Intake 240 ml  Output 700 ml  Net -460 ml       Labs: Basic Metabolic Panel: Recent Labs  Lab 05/09/18 0743  05/11/18 0626 05/12/18 0531  05/13/18 0636  NA 137   < > 139 139 137  K 3.8   < > 4.2 4.4 4.4  CL 102   < > 102 100 101  CO2 27   < > 27 30 27   GLUCOSE 112*   < > 105* 130* 118*  BUN 25*   < > 20 21* 25*  CREATININE 3.65*   < > 3.49* 4.07* 4.54*  CALCIUM 8.4*   < > 8.1* 8.2* 8.0*  PHOS 5.2*  --   --  3.8 4.1   < > = values in this interval not displayed.   Liver Function Tests: Recent Labs  Lab 05/09/18 0743 05/12/18 0531 05/13/18 0636  ALBUMIN 1.9* 1.9* 1.8*   No results for input(s): LIPASE, AMYLASE in the last 168 hours. No results for input(s): AMMONIA in the last 168 hours. CBC: Recent Labs  Lab 05/09/18 0641 05/10/18 0710 05/11/18 0626 05/12/18 0531 05/13/18 0635  WBC 13.0* 12.6* 10.4 9.3 8.9  HGB 9.0* 9.3* 8.7* 8.5* 7.9*  HCT 30.0* 31.1* 29.9* 28.9* 27.1*  MCV 84.5 85.0 86.9 88.4 87.4  PLT 211 213 242 242 219   Cardiac Enzymes: No results for input(s): CKTOTAL, CKMB, CKMBINDEX, TROPONINI in the last 168 hours. CBG: Recent Labs  Lab 05/12/18 0736 05/12/18 1142 05/12/18 1639 05/12/18 2107 05/13/18 0800  GLUCAP 120* 128* 150* 128* 117*  Iron Studies: No results for input(s): IRON, TIBC, TRANSFERRIN, FERRITIN in the last 72 hours. Studies/Results: No results found.  Medications: Infusions: . sodium chloride 10 mL/hr at 05/08/18 2505    Scheduled Medications: . amiodarone  400 mg Oral Daily  . aspirin  81 mg Oral Daily  . chlorhexidine  15 mL Mouth Rinse BID  . Chlorhexidine Gluconate Cloth  6 each Topical Q0600  . darbepoetin (ARANESP) injection - DIALYSIS  100 mcg Intravenous Q Thu-HD  . diphenoxylate-atropine  2 tablet Oral QID  . feeding supplement (PRO-STAT SUGAR FREE 64)  30 mL Oral TID BM  . ferrous sulfate  325 mg Oral BID WC  . furosemide  80 mg Intravenous BID  . heparin injection (subcutaneous)  5,000 Units Subcutaneous Q8H  . insulin aspart  0-5 Units Subcutaneous QHS  . insulin aspart  0-9 Units Subcutaneous TID WC  . mouth rinse  15 mL Mouth Rinse q12n4p   . metoprolol tartrate  12.5 mg Oral BID  . multivitamin  1 tablet Oral QHS  . pantoprazole  40 mg Oral BID AC  . rosuvastatin  20 mg Oral q1800  . sodium chloride flush  10-40 mL Intracatheter Q12H    have reviewed scheduled and prn medications.  Physical Exam: General: Not in distress, comfortable Heart: Regular rate rhythm S1-S2 normal Lungs: Bibasilar decreased breath sound, no wheezing Abdomen:soft, Non-tender, non-distended Extremities: Edema up to thigh.  Unchanged Dialysis Access:RIJ TDC  Jennings Corado Prasad Lynita Groseclose 05/13/2018,11:25 AM  LOS: 18 days

## 2018-05-13 NOTE — Progress Notes (Signed)
PROGRESS NOTE  [name]  FBP:[id number] DOB: [birth year] DOA: 04/24/2018 PCP: [name], MD   Brief Narrative: [name] is a 52 y.o. female with a history of uncontrolled T2DM with neuropathy, PAD s/p left fem-pop bypass graft occlusion Jan 2019, right fibula osteomyelitis, PAF on xarelto, HLD, and depression with cognitive impairment who presented to the ED with septic shock due to left leg gangrene, NSTEMI and new LV dysfunction (EF 30-35%) with new wall motion abnormality. She was admitted  6/7. 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. Septic shock resolved but was complicated by oliguric renal failure requiring intermittent HD. Amiodarone has been started for rate control. Anticoagulation has been stopped due to suspected GI bleeding.  Assessment & Plan: Principal Problem:   Septic shock ([place]) Active Problems:   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)   PAD (peripheral artery disease) (HCC)   Pressure injury of skin   DM2 (diabetes mellitus, type 2) (HCC)   Acute kidney injury ([place])   Buttock wound, left, subsequent encounter   Paroxysmal atrial fibrillation with rapid ventricular response (HCC)   Lower limb ischemia   Microcytic anemia   Gangrene of lower extremity (HCC)   Acute renal failure (HCC)   Acute respiratory failure (HCC)   Sepsis (HCC)   Acute encephalopathy   Acute combined systolic and diastolic heart failure (HCC)   Non-ST elevation (NSTEMI) myocardial infarction ([place])  Left lower extremity gangrene, PAD and septic shock:  - 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.  - Off abx  Oliguric acute renal failure with ATN: Due to septic shock.  - Intermittent HD started 6/12, hoping for renal recovery with diuretics directed by nephrology. Cr slightly up, will continue daily monitoring.   NSTEMI: Felt to be due to demand in setting of septic shock. With known coronary calcifications, now regional wall motion abnormality.  - Cardiology hoping to  avoid contrast with cath as we monitor for renal recovery.  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  - Address volume status with diuretics and HD if needed.  - On beta blocker - BP will not tolerate ACE/ARB, spironolactone.   PAF: With RVR now resolved.  - Continue metoprolol and amiodarone (new)  Acute on anemia of chronic disease: s/p 1u PRBCs 6/20 - Continue aranesp per nephrology - FOBT+, so heparin discontinued per cardiology. If develops gross bleeding, would need urgent GI consult.  - Continue po iron (ferritin 234, iron 29)   T2DM: HbA1c 8.4%.  - SSI AC/HS, at inpatient goal. This is well below her outpatient lantus 36u BID.   Developmental delay/cognitive deficits: Per history. Pt currently seems able to understand hospital course.   Diarrhea: Improving. Previous CDiff negative.  - Lomotil prn.   DVT prophylaxis: SCDs Code Status: Full Family Communication: None at bedside Disposition Plan: Pending declaration of ESRD vs. renal recovery.  Consultants:   Nephrology  Cardiology  PCCM  Procedures:  Echo 04/28/2018 LV EF: 30% - 35% Study Conclusions - Left ventricle: The cavity size was normal. There was mild concentric hypertrophy. Systolic function was moderately to severely reduced. The estimated ejection fraction was in the range of 30% to 35%. Features are consistent with a pseudonormal left ventricular filling pattern, with concomitant abnormal relaxation and increased filling pressure (grade 2 diastolic dysfunction). Doppler parameters are consistent with elevated ventricular end-diastolic filling pressure. - Mitral valve: Calcified annulus. Moderately thickened, moderately calcified leaflets . There was mild  regurgitation. - Left atrium: The atrium was mildly dilated. - Right ventricle: The cavity size was moderately dilated. Wall thickness was normal. Systolic function was moderately  reduced. - Tricuspid valve: There was mild regurgitation. - Pulmonary arteries: Systolic pressure was mildly increased. PA peak pressure: 34 mm Hg (S). - Pericardium, extracardiac: A trivial pericardial effusion was identified. Features were not consistent with tamponade physiology.  Impressions: - When compared to the prior study from 04/17/2017 LVEF has decreased from 55-60% to 30-35% with new wall motion abnormalities - diffuse hypokinesis and akinesis of the basal and mid anteroseptal, anterior and apical septal and anterior walls. RVEF is moderately decreased.  Antimicrobials:  Vancomycin, zosyn 6/7 - 6/10  Ceftazidime 6/10 - 6/14  Subjective: No chest pain or dyspnea. Has upset stomach but no vomiting or diarrhea. No itching, hiccups.   Objective: Vitals:   05/13/18 0418 05/13/18 0800 05/13/18 1055 05/13/18 1259  BP: (!) 103/56 128/61 (!) 115/59 (!) 135/58  Pulse: 63 72 68 71  Resp:  20  18  Temp: 98.9 F (37.2 C) 98.1 F (36.7 C)  97.8 F (36.6 C)  TempSrc: Oral Oral  Oral  SpO2: 93% 94%  97%  Weight: 87.7 kg (193 lb 5.5 oz)     Height:        Intake/Output Summary (Last 24 hours) at 05/13/2018 1541 Last data filed at 05/13/2018 1408 Gross per 24 hour  Intake 480 ml  Output 1301 ml  Net -821 ml   Filed Weights   05/11/18 0634 05/12/18 0300 05/13/18 0418  Weight: 88.2 kg (194 lb 7.1 oz) 87.9 kg (193 lb 12.6 oz) 87.7 kg (193 lb 5.5 oz)    Gen: 52 y.o. female in no distress HEENT: Poor dentition Pulm: Non-labored breathing. Clear to auscultation bilaterally.  CV: Regular rate and rhythm. No murmur, rub, or gallop. No JVD. GI: Abdomen soft, non-tender, non-distended, with normoactive bowel sounds. No organomegaly or masses felt. Ext: [name] with edema pitting to the knee. LLE with BKA stump site dressing c/d/i Skin: No rashes, lesions or ulcers. Right IJ TDC nontender without erythema. Neuro: Alert and oriented. No focal neurological  deficits. Psych: Judgement and insight appear normal. Mood & affect appropriate.   Data Reviewed: I have personally reviewed following labs and imaging studies  CBC: Recent Labs  Lab 05/09/18 0641 05/10/18 0710 05/11/18 0626 05/12/18 0531 05/13/18 0635  WBC 13.0* 12.6* 10.4 9.3 8.9  HGB 9.0* 9.3* 8.7* 8.5* 7.9*  HCT 30.0* 31.1* 29.9* 28.9* 27.1*  MCV 84.5 85.0 86.9 88.4 87.4  PLT 211 213 242 242 962   Basic Metabolic Panel: Recent Labs  Lab 05/07/18 0418  05/09/18 0641 05/09/18 0743 05/10/18 1514 05/11/18 0626 05/12/18 0531 05/13/18 0635 05/13/18 0636  NA 136   < > 137 137 138 139 139  --  137  K 3.6   < > 3.9 3.8 4.0 4.2 4.4  --  4.4  CL 102   < > 102 102 103 102 100  --  101  CO2 26   < > 25 27 30 27 30   --  27  GLUCOSE 113*   < > 110* 112* 148* 105* 130*  --  118*  BUN 32*   < > 25* 25* 16 20 21*  --  25*  CREATININE 4.69*   < > 3.86* 3.65* 3.13* 3.49* 4.07*  --  4.54*  CALCIUM 7.8*   < > 8.6* 8.4* 8.1* 8.1* 8.2*  --  8.0*  MG 1.6*   < >  1.7  --  1.7 1.6* 2.1 1.9  --   PHOS 4.8*  --   --  5.2*  --   --  3.8  --  4.1   < > = values in this interval not displayed.   GFR: Estimated Creatinine Clearance: 16.1 mL/min (A) (by C-G formula based on SCr of 4.54 mg/dL (H)). Liver Function Tests: Recent Labs  Lab 05/09/18 0743 05/12/18 0531 05/13/18 0636  ALBUMIN 1.9* 1.9* 1.8*   No results for input(s): LIPASE, AMYLASE in the last 168 hours. No results for input(s): AMMONIA in the last 168 hours. Coagulation Profile: No results for input(s): INR, PROTIME in the last 168 hours. Cardiac Enzymes: No results for input(s): CKTOTAL, CKMB, CKMBINDEX, TROPONINI in the last 168 hours. BNP (last 3 results) No results for input(s): PROBNP in the last 8760 hours. HbA1C: No results for input(s): HGBA1C in the last 72 hours. CBG: Recent Labs  Lab 05/12/18 1142 05/12/18 1639 05/12/18 2107 05/13/18 0800 05/13/18 1136  GLUCAP 128* 150* 128* 117* 110*   Lipid  Profile: No results for input(s): CHOL, HDL, LDLCALC, TRIG, CHOLHDL, LDLDIRECT in the last 72 hours. Thyroid Function Tests: No results for input(s): TSH, T4TOTAL, FREET4, T3FREE, THYROIDAB in the last 72 hours. Anemia Panel: No results for input(s): VITAMINB12, FOLATE, FERRITIN, TIBC, IRON, RETICCTPCT in the last 72 hours. Urine analysis:    Component Value Date/Time   COLORURINE YELLOW 04/25/2018 0407   APPEARANCEUR CLOUDY (A) 04/25/2018 0407   LABSPEC 1.012 04/25/2018 0407   PHURINE 5.0 04/25/2018 0407   GLUCOSEU NEGATIVE 04/25/2018 0407   HGBUR MODERATE (A) 04/25/2018 0407   BILIRUBINUR NEGATIVE 04/25/2018 0407   KETONESUR NEGATIVE 04/25/2018 0407   PROTEINUR 100 (A) 04/25/2018 0407   UROBILINOGEN 0.2 06/25/2011 1707   NITRITE NEGATIVE 04/25/2018 0407   LEUKOCYTESUR NEGATIVE 04/25/2018 0407   No results found for this or any previous visit (from the past 240 hour(s)).    Radiology Studies: No results found.  Scheduled Meds: . amiodarone  400 mg Oral Daily  . aspirin  81 mg Oral Daily  . chlorhexidine  15 mL Mouth Rinse BID  . Chlorhexidine Gluconate Cloth  6 each Topical Q0600  . darbepoetin (ARANESP) injection - DIALYSIS  100 mcg Intravenous Q Thu-HD  . diphenoxylate-atropine  2 tablet Oral QID  . feeding supplement (PRO-STAT SUGAR FREE 64)  30 mL Oral TID BM  . ferrous sulfate  325 mg Oral BID WC  . furosemide  80 mg Intravenous BID  . heparin injection (subcutaneous)  5,000 Units Subcutaneous Q8H  . insulin aspart  0-5 Units Subcutaneous QHS  . insulin aspart  0-9 Units Subcutaneous TID WC  . mouth rinse  15 mL Mouth Rinse q12n4p  . metoprolol tartrate  12.5 mg Oral BID  . multivitamin  1 tablet Oral QHS  . pantoprazole  40 mg Oral BID AC  . rosuvastatin  20 mg Oral q1800  . sodium chloride flush  10-40 mL Intracatheter Q12H   Continuous Infusions: . sodium chloride 10 mL/hr at 05/08/18 0027     LOS: 18 days   Time spent: 25 minutes.  Patrecia Pour,  MD [name] [email or web address] Password [facility] 05/13/2018, 3:41 PM

## 2018-05-13 NOTE — Progress Notes (Signed)
Patient resting comfortably during shift report. Denies complaints.  

## 2018-05-14 DIAGNOSIS — Z89612 Acquired absence of left leg above knee: Secondary | ICD-10-CM

## 2018-05-14 DIAGNOSIS — R5381 Other malaise: Secondary | ICD-10-CM

## 2018-05-14 LAB — RENAL FUNCTION PANEL
Albumin: 1.9 g/dL — ABNORMAL LOW (ref 3.5–5.0)
Anion gap: 10 (ref 5–15)
BUN: 30 mg/dL — ABNORMAL HIGH (ref 6–20)
CO2: 26 mmol/L (ref 22–32)
Calcium: 8.2 mg/dL — ABNORMAL LOW (ref 8.9–10.3)
Chloride: 103 mmol/L (ref 98–111)
Creatinine, Ser: 4.9 mg/dL — ABNORMAL HIGH (ref 0.44–1.00)
GFR calc Af Amer: 11 mL/min — ABNORMAL LOW
GFR calc non Af Amer: 9 mL/min — ABNORMAL LOW
Glucose, Bld: 125 mg/dL — ABNORMAL HIGH (ref 70–99)
Phosphorus: 4.4 mg/dL (ref 2.5–4.6)
Potassium: 4.1 mmol/L (ref 3.5–5.1)
Sodium: 139 mmol/L (ref 135–145)

## 2018-05-14 LAB — GLUCOSE, CAPILLARY
Glucose-Capillary: 123 mg/dL — ABNORMAL HIGH (ref 70–99)
Glucose-Capillary: 109 mg/dL — ABNORMAL HIGH (ref 70–99)
Glucose-Capillary: 156 mg/dL — ABNORMAL HIGH (ref 70–99)
Glucose-Capillary: 126 mg/dL — ABNORMAL HIGH (ref 70–99)

## 2018-05-14 LAB — CBC
HCT: 29.4 % — ABNORMAL LOW (ref 36.0–46.0)
Hemoglobin: 8.7 g/dL — ABNORMAL LOW (ref 12.0–15.0)
MCH: 25.7 pg — ABNORMAL LOW (ref 26.0–34.0)
MCHC: 29.6 g/dL — ABNORMAL LOW (ref 30.0–36.0)
MCV: 87 fL (ref 78.0–100.0)
Platelets: 232 10*3/uL (ref 150–400)
RBC: 3.38 MIL/uL — ABNORMAL LOW (ref 3.87–5.11)
RDW: 21.8 % — ABNORMAL HIGH (ref 11.5–15.5)
WBC: 8.4 10*3/uL (ref 4.0–10.5)

## 2018-05-14 LAB — MAGNESIUM: Magnesium: 1.8 mg/dL (ref 1.7–2.4)

## 2018-05-14 MED ORDER — FUROSEMIDE 10 MG/ML IJ SOLN
80.0000 mg | Freq: Three times a day (TID) | INTRAMUSCULAR | Status: DC
  Administered 2018-05-14 – 2018-05-16 (×6): 80 mg via INTRAVENOUS
  Filled 2018-05-14 (×6): qty 8

## 2018-05-14 MED ORDER — ALBUTEROL SULFATE 2 MG PO TABS
2.0000 mg | ORAL_TABLET | Freq: Three times a day (TID) | ORAL | Status: DC | PRN
  Administered 2018-05-14: 2 mg via ORAL
  Filled 2018-05-14 (×2): qty 1

## 2018-05-14 MED ORDER — MAGNESIUM SULFATE 2 GM/50ML IV SOLN
2.0000 g | Freq: Once | INTRAVENOUS | Status: AC
  Administered 2018-05-14: 2 g via INTRAVENOUS
  Filled 2018-05-14: qty 50

## 2018-05-14 NOTE — Progress Notes (Signed)
Nutrition Follow-up  DOCUMENTATION CODES:   Not applicable  INTERVENTION:   -D/c Prostat, due to poor acceptance -Continue renal MVI daily -Continue snacks TID  NUTRITION DIAGNOSIS:   Increased nutrient needs related to wound healing as evidenced by estimated needs.  Ongoing  GOAL:   Patient will meet greater than or equal to 90% of their needs  Progressing  MONITOR:   PO intake, Supplement acceptance, Labs, Weight trends, Skin, I & O's  REASON FOR ASSESSMENT:   Consult Enteral/tube feeding initiation and management  ASSESSMENT:   52 y/o female with CAD and peripheral arterial disease was admitted with sepsis from an ischemic lower extremity eventually requiring amputation (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).  She also had an NSTEMI with new cardiomyopathy.  6/9- s/p lt AKA 6/13- rectal tube in place 6/17- pulled out HD cath 6/18- s/p rt IJ HD cath placement 6/21- pulled IJ cath out  Pt sitting in bed, watching TV and eating lunch at time of visit. Pt consumed about half of meal upon visit. Meal completion remains variable; PO15-100% (averaging 50%), which is an improvement from last week. Pt refusing Prostat supplement, claiming in makes her vomit.   Nephrology continues to watch closely for renal recovery. Last HD 05/09/18.   Case discussed with CSW, who reports IV lasix is a barrier to discharge. Plan to transfer to SNF vs CIR once medically stable.   Labs reviewed: CBGS: 109-158 (inpatient orders for glycemic control are 0-5 units insulin aspart q HS, 0-9 units insulin aspart TID with meals).   Diet Order:   Diet Order           DIET DYS 3 Room service appropriate? Yes; Fluid consistency: Thin  Diet effective now          EDUCATION NEEDS:   Not appropriate for education at this time  Skin:  Skin Assessment: Skin Integrity Issues: Skin Integrity Issues:: Stage III, Incisions Stage III: buttocks Incisions: left leg s/p BKA  Last BM:   05/13/18  Height:   Ht Readings from Last 1 Encounters:  05/02/18 5\' 7"  (1.702 m)    Weight:   Wt Readings from Last 1 Encounters:  05/14/18 192 lb 7.4 oz (87.3 kg)    Ideal Body Weight:  57.1 kg(adjusted L BKA)  BMI:  Body mass index is 30.14 kg/m.  Estimated Nutritional Needs:   Kcal:  1700-2000kcal/day   Protein:  80-95g/day   Fluid:  >1.5L/day     Marshe Shrestha A. Jimmye Norman, RD, LDN, CDE Pager: [phone] After hours Pager: [phone]

## 2018-05-14 NOTE — Progress Notes (Signed)
[facility] NEPHROLOGY PROGRESS NOTE  Assessment/ Plan: Pt is a 52 y.o. yo female with history of diabetes, peripheral artery disease, A. fib, admitted with septic shock due to left lower extremity gangrenous with multiorgan dysfunction, non-STEMI, EF of 30 to 35%.  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  Developed and anuric AKI requiring intermittent dialysis.  Assessment/Plan:  #Oliguric AKI from ATN in the setting of septic shock: Started dialysis on 6/12, tunneled catheter was placed with IR on 6/19.  Last dialysis was on 6/22.  -Patient was started on IV Lasix since 6/25 with urine output of 700 cc/24 hr. Serum creatinine level elevated to 4.9. I increase the dose of lasix to 80 mg  Q8hr. Plan to continue diuretics with close monitoring of urine output and labs.  No plan for dialysis today.  Daily assessment for the need of dialysis. -watch for renal recovery. -Discussed with the patient and her husband.  #Septic shock: Status post antibiotics, stress dose steroid, resolved now.  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)  # NSTEMI; may need cath, new RWMA on echo. Monitor kidney function.  # h/o Afib with RVR  Subjective: Seen and examined at bedside. Feels better. Still has leg edema. No chest pain, SOB, nausea or vomiting. Her husband at bedside.  Objective Vital signs in last 24 hours: Vitals:   05/13/18 1259 05/13/18 2152 05/14/18 0446 05/14/18 0841  BP: (!) 135/58 127/66 134/68 136/62  Pulse: 71 67 62 68  Resp: 18 17 17    Temp: 97.8 F (36.6 C) 98.2 F (36.8 C) 97.9 F (36.6 C)   TempSrc: Oral Oral Oral   SpO2: 97% 98% 95%   Weight:   87.3 kg (192 lb 7.4 oz)   Height:       Weight change: -0.4 kg (-14.1 oz)  Intake/Output Summary (Last 24 hours) at 05/14/2018 1202 Last data filed at 05/14/2018 0851 Gross per 24 hour  Intake 730 ml  Output 701 ml  Net 29 ml       Labs: Basic Metabolic Panel: Recent Labs  Lab  05/12/18 0531 05/13/18 0636 05/14/18 0628  NA 139 137 139  K 4.4 4.4 4.1  CL 100 101 103  CO2 30 27 26   GLUCOSE 130* 118* 125*  BUN 21* 25* 30*  CREATININE 4.07* 4.54* 4.90*  CALCIUM 8.2* 8.0* 8.2*  PHOS 3.8 4.1 4.4   Liver Function Tests: Recent Labs  Lab 05/12/18 0531 05/13/18 0636 05/14/18 0628  ALBUMIN 1.9* 1.8* 1.9*   No results for input(s): LIPASE, AMYLASE in the last 168 hours. No results for input(s): AMMONIA in the last 168 hours. CBC: Recent Labs  Lab 05/10/18 0710 05/11/18 0626 05/12/18 0531 05/13/18 0635 05/14/18 0628  WBC 12.6* 10.4 9.3 8.9 8.4  HGB 9.3* 8.7* 8.5* 7.9* 8.7*  HCT 31.1* 29.9* 28.9* 27.1* 29.4*  MCV 85.0 86.9 88.4 87.4 87.0  PLT 213 242 242 219 232   Cardiac Enzymes: No results for input(s): CKTOTAL, CKMB, CKMBINDEX, TROPONINI in the last 168 hours. CBG: Recent Labs  Lab 05/13/18 1136 05/13/18 1603 05/13/18 1623 05/13/18 2150 05/14/18 0803  GLUCAP 110* 57* 131* 158* 123*    Iron Studies: No results for input(s): IRON, TIBC, TRANSFERRIN, FERRITIN in the last 72 hours. Studies/Results: No results found.  Medications: Infusions: . sodium chloride 10 mL/hr at 05/08/18 0027  . magnesium sulfate 1 - 4 g bolus IVPB      Scheduled Medications: . amiodarone  400 mg Oral Daily  . aspirin  81 mg Oral Daily  . chlorhexidine  15 mL Mouth Rinse BID  . Chlorhexidine Gluconate Cloth  6 each Topical Q0600  . darbepoetin (ARANESP) injection - DIALYSIS  100 mcg Intravenous Q Thu-HD  . diphenoxylate-atropine  2 tablet Oral QID  . feeding supplement (PRO-STAT SUGAR FREE 64)  30 mL Oral TID BM  . ferrous sulfate  325 mg Oral BID WC  . furosemide  80 mg Intravenous Q8H  . heparin injection (subcutaneous)  5,000 Units Subcutaneous Q8H  . insulin aspart  0-5 Units Subcutaneous QHS  . insulin aspart  0-9 Units Subcutaneous TID WC  . mouth rinse  15 mL Mouth Rinse q12n4p  . metoprolol tartrate  12.5 mg Oral BID  . multivitamin  1 tablet  Oral QHS  . pantoprazole  40 mg Oral BID AC  . rosuvastatin  20 mg Oral q1800  . sodium chloride flush  10-40 mL Intracatheter Q12H    have reviewed scheduled and prn medications.  Physical Exam: General: NAD, comfortable Heart: Regular rate rhythm S1-S2 normal Lungs: bi-basal decreased breath sound, no wheeze. Abdomen:soft, Non-tender, non-distended Extremities: right leg pitting edema ++ Dialysis Access:RIJ TDC  Dron Tanna Furry 05/14/2018,12:02 PM  LOS: 19 days

## 2018-05-14 NOTE — Progress Notes (Signed)
Inpatient Rehabilitation-Admissions Coordinator    Met with patient at the bedside to discuss team's recommendation for inpatient rehabilitation. Shared booklets, expectations while in CIR, expected length of stay, and anticipated functional level at DC. Pt interested in CIR but did report hesitancy about cost, despite insurance coverage-AC plans to share benefit information with pt tomorrow to see if pt still wanting to pursue rehab. Of note, pt is unreliable historian today with pt unable to provide any history regarding her prior level of function; AC awaiting call back from spouse in order to gather more information needed for a preadmission assessment. Please call if questions.   Jhonnie Garner, OTR/L  Rehab Admissions Coordinator  [phone] 05/14/2018 4:26 PM

## 2018-05-14 NOTE — Progress Notes (Signed)
PROGRESS NOTE  [name]  TOI:[id number] DOB: 1966/03/22 DOA: 04/24/2018 PCP: [name], MD   Brief Narrative: [name] is a 52 y.o. female with a history of uncontrolled T2DM with neuropathy, PAD s/p left fem-pop bypass graft occlusion Jan 2019, right fibula osteomyelitis, PAF on xarelto, HLD, and depression with cognitive impairment who presented to the ED with septic shock due to left leg gangrene, NSTEMI and new LV dysfunction (EF 30-35%) with new wall motion abnormality. She was admitted  6/7. 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. Septic shock resolved but was complicated by oliguric renal failure requiring intermittent HD. Amiodarone has been started for rate control. Anticoagulation has been stopped due to suspected GI bleeding.  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)   PAD (peripheral artery disease) (HCC)   Pressure injury of skin   DM2 (diabetes mellitus, type 2) (HCC)   Acute kidney injury ([place])   Buttock wound, left, subsequent encounter   Paroxysmal atrial fibrillation with rapid ventricular response (HCC)   Lower limb ischemia   Microcytic anemia   Gangrene of lower extremity (HCC)   Acute renal failure (HCC)   Acute respiratory failure (HCC)   Sepsis (HCC)   Acute encephalopathy   Acute combined systolic and diastolic heart failure (HCC)   Non-ST elevation (NSTEMI) myocardial infarction ([place])  Left lower extremity gangrene, PAD and septic shock:  - 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.  - Off abx  Oliguric acute renal failure with ATN: Due to septic shock.  - Intermittent HD started 6/12, hoping for renal recovery with diuretics directed by nephrology. Augmenting today. Cr continues to rise with limited UOP (700cc/24hrs).   NSTEMI: Felt to be due to demand in setting of septic shock. With known coronary calcifications, now regional wall motion abnormality.  -  Cardiology hoping to avoid contrast with cath as we monitor for renal recovery.  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  - Address volume status with diuretics and HD if needed. Remains peripherally overloaded but in no respiratory distress.  - On beta blocker - BP will not tolerate ACE/ARB, spironolactone.   PAF: With RVR now resolved.  - Continue metoprolol and amiodarone (new)  Acute on anemia of chronic disease: s/p 1u PRBCs 6/20 - Continue aranesp per nephrology - FOBT+, so heparin discontinued per cardiology. If develops gross bleeding, would need urgent GI consult.  - Continue po iron (ferritin 234, iron 29)  T2DM: HbA1c 8.4%.  - SSI AC/HS, at inpatient goal. This is well below her outpatient lantus 36u BID.   Developmental delay/cognitive deficits: Per history. Pt currently seems able to understand hospital course.   Diarrhea: Improving. Previous CDiff negative.  - Lomotil prn.   DVT prophylaxis: SCDs Code Status: Full Family Communication: Husband at bedside Disposition Plan: Pending declaration of ESRD vs. renal recovery. CIR consulted and feel she is appropriate for transfer. Will follow up with rehab admissions coordinator.   Consultants:   Nephrology  Cardiology  PCCM  Procedures:  Echo 04/28/2018 LV EF: 30% - 35% Study Conclusions - Left ventricle: The cavity size was normal. There was mild concentric hypertrophy. Systolic function was moderately to severely reduced. The estimated ejection fraction was in the range of 30% to 35%. Features are consistent with a pseudonormal left ventricular filling pattern, with concomitant abnormal relaxation and increased filling pressure (grade  2 diastolic dysfunction). Doppler parameters are consistent with elevated ventricular end-diastolic filling pressure. - Mitral valve: Calcified annulus. Moderately thickened, moderately calcified leaflets . There was mild  regurgitation. - Left atrium: The atrium was mildly dilated. - Right ventricle: The cavity size was moderately dilated. Wall thickness was normal. Systolic function was moderately reduced. - Tricuspid valve: There was mild regurgitation. - Pulmonary arteries: Systolic pressure was mildly increased. PA peak pressure: 34 mm Hg (S). - Pericardium, extracardiac: A trivial pericardial effusion was identified. Features were not consistent with tamponade physiology.  Impressions: - When compared to the prior study from 04/17/2017 LVEF has decreased from 55-60% to 30-35% with new wall motion abnormalities - diffuse hypokinesis and akinesis of the basal and mid anteroseptal, anterior and apical septal and anterior walls. RVEF is moderately decreased.  Antimicrobials:  Vancomycin, zosyn 6/7 - 6/10  Ceftazidime 6/10 - 6/14  Subjective: Nausea is stable and chronic, no new complaints. No vomiting, diarrhea, itching, hiccups, dyspnea or chest pain. Couldn't tolerate purewick, so going in bedpan.  Objective: Vitals:   05/13/18 2152 05/14/18 0446 05/14/18 0841 05/14/18 1230  BP: 127/66 134/68 136/62 (!) 75/43  Pulse: 67 62 68 62  Resp: 17 17  18   Temp: 98.2 F (36.8 C) 97.9 F (36.6 C)  97.9 F (36.6 C)  TempSrc: Oral Oral  Oral  SpO2: 98% 95%  100%  Weight:  87.3 kg (192 lb 7.4 oz)    Height:        Intake/Output Summary (Last 24 hours) at 05/14/2018 1255 Last data filed at 05/14/2018 0851 Gross per 24 hour  Intake 730 ml  Output 0 ml  Net 730 ml   Filed Weights   05/12/18 0300 05/13/18 0418 05/14/18 0446  Weight: 87.9 kg (193 lb 12.6 oz) 87.7 kg (193 lb 5.5 oz) 87.3 kg (192 lb 7.4 oz)   Gen: 52 y.o. female in no distress Pulm: Nonlabored breathing room air. Clear. CV: Regular rate and rhythm. No murmur, rub, or gallop. No JVD, + dependent edema. GI: Abdomen soft, non-tender, non-distended, with normoactive bowel sounds.  Ext: Right leg pitting edema to knee.   Skin: Left BKA stump site with c/d/i dressing, no tracking erythema. Right TDC nontender, no discharge or erythema.  Neuro: Alert and oriented. No focal neurological deficits. Psych: Judgement and insight appear fair. Mood euthymic & affect congruent. Behavior is appropriate.    CBC: Recent Labs  Lab 05/10/18 0710 05/11/18 0626 05/12/18 0531 05/13/18 0635 05/14/18 0628  WBC 12.6* 10.4 9.3 8.9 8.4  HGB 9.3* 8.7* 8.5* 7.9* 8.7*  HCT 31.1* 29.9* 28.9* 27.1* 29.4*  MCV 85.0 86.9 88.4 87.4 87.0  PLT 213 242 242 219 030   Basic Metabolic Panel: Recent Labs  Lab 05/09/18 0743 05/10/18 1514 05/11/18 0626 05/12/18 0531 05/13/18 0635 05/13/18 0636 05/14/18 0628  NA 137 138 139 139  --  137 139  K 3.8 4.0 4.2 4.4  --  4.4 4.1  CL 102 103 102 100  --  101 103  CO2 27 30 27 30   --  27 26  GLUCOSE 112* 148* 105* 130*  --  118* 125*  BUN 25* 16 20 21*  --  25* 30*  CREATININE 3.65* 3.13* 3.49* 4.07*  --  4.54* 4.90*  CALCIUM 8.4* 8.1* 8.1* 8.2*  --  8.0* 8.2*  MG  --  1.7 1.6* 2.1 1.9  --  1.8  PHOS 5.2*  --   --  3.8  --  4.1 4.4  GFR: Estimated Creatinine Clearance: 14.9 mL/min (A) (by C-G formula based on SCr of 4.9 mg/dL (H)). Liver Function Tests: Recent Labs  Lab 05/09/18 0743 05/12/18 0531 05/13/18 0636 05/14/18 0628  ALBUMIN 1.9* 1.9* 1.8* 1.9*   No results for input(s): LIPASE, AMYLASE in the last 168 hours. No results for input(s): AMMONIA in the last 168 hours. Coagulation Profile: No results for input(s): INR, PROTIME in the last 168 hours. Cardiac Enzymes: No results for input(s): CKTOTAL, CKMB, CKMBINDEX, TROPONINI in the last 168 hours. BNP (last 3 results) No results for input(s): PROBNP in the last 8760 hours. HbA1C: No results for input(s): HGBA1C in the last 72 hours. CBG: Recent Labs  Lab 05/13/18 1603 05/13/18 1623 05/13/18 2150 05/14/18 0803 05/14/18 1228  GLUCAP 57* 131* 158* 123* 109*   Lipid Profile: No results for input(s): CHOL,  HDL, LDLCALC, TRIG, CHOLHDL, LDLDIRECT in the last 72 hours. Thyroid Function Tests: No results for input(s): TSH, T4TOTAL, FREET4, T3FREE, THYROIDAB in the last 72 hours. Anemia Panel: No results for input(s): VITAMINB12, FOLATE, FERRITIN, TIBC, IRON, RETICCTPCT in the last 72 hours. Urine analysis:    Component Value Date/Time   COLORURINE YELLOW 04/25/2018 0407   APPEARANCEUR CLOUDY (A) 04/25/2018 0407   LABSPEC 1.012 04/25/2018 0407   PHURINE 5.0 04/25/2018 0407   GLUCOSEU NEGATIVE 04/25/2018 0407   HGBUR MODERATE (A) 04/25/2018 0407   BILIRUBINUR NEGATIVE 04/25/2018 0407   KETONESUR NEGATIVE 04/25/2018 0407   PROTEINUR 100 (A) 04/25/2018 0407   UROBILINOGEN 0.2 06/25/2011 1707   NITRITE NEGATIVE 04/25/2018 0407   LEUKOCYTESUR NEGATIVE 04/25/2018 0407   No results found for this or any previous visit (from the past 240 hour(s)).    Radiology Studies: No results found.  Scheduled Meds: . amiodarone  400 mg Oral Daily  . aspirin  81 mg Oral Daily  . chlorhexidine  15 mL Mouth Rinse BID  . Chlorhexidine Gluconate Cloth  6 each Topical Q0600  . darbepoetin (ARANESP) injection - DIALYSIS  100 mcg Intravenous Q Thu-HD  . diphenoxylate-atropine  2 tablet Oral QID  . feeding supplement (PRO-STAT SUGAR FREE 64)  30 mL Oral TID BM  . ferrous sulfate  325 mg Oral BID WC  . furosemide  80 mg Intravenous Q8H  . heparin injection (subcutaneous)  5,000 Units Subcutaneous Q8H  . insulin aspart  0-5 Units Subcutaneous QHS  . insulin aspart  0-9 Units Subcutaneous TID WC  . mouth rinse  15 mL Mouth Rinse q12n4p  . metoprolol tartrate  12.5 mg Oral BID  . multivitamin  1 tablet Oral QHS  . pantoprazole  40 mg Oral BID AC  . rosuvastatin  20 mg Oral q1800  . sodium chloride flush  10-40 mL Intracatheter Q12H   Continuous Infusions: . sodium chloride 10 mL/hr at 05/08/18 0027  . magnesium sulfate 1 - 4 g bolus IVPB       LOS: 19 days   Time spent: 25 minutes.  Patrecia Pour,  MD [name] [email or web address] Password [facility] 05/14/2018, 12:55 PM

## 2018-05-14 NOTE — Consult Note (Signed)
Physical Medicine and Rehabilitation Consult Reason for Consult:Decreased functional mobility Referring Physician: [facility]   HPI: [name] is a 52 y.o. right handed female with history of atrial fibrillation on Xarelto, diabetes mellitus peripheral vascular disease.  Per chart review patient lives with spouse.  Used a Rollator walker prior to admission.  Husband works during the day.  Patient with recent admission 5-10-5/14/2019 for redo of left femoral to PTA bypass.  Postoperatively needed thrombectomy.  Recent follow-up with vascular surgery with findings of occluded bypass graft.  Presented 04/25/2018 with leg pain and ischemic changes.  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.  Hospital course pain management.  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  Patient developed septic shock follow-up per pulmonary services.  Findings of elevated troponin felt to be related to non-ST elevation MI in the setting of hypotension.  Patient maintained on intravenous heparin.  Consideration has been made for possible need for cardiac catheterization.  AKI in the setting of septic shock CK of 1049.  Maintain on IV fluids follow-up per renal services with latest creatinine 4.90 and hemodialysis was initiated 04/29/2018 with tunneled catheter placed.  Dialysis has been placed on hold with latest dialysis 05/09/2018.  Acute blood loss anemia 8.7 and monitored.  Therapy evaluations completed and ongoing.  MD has requested physical medicine rehab consult.   Review of Systems  Constitutional: Negative for fever.  HENT: Negative for hearing loss.   Eyes: Negative for blurred vision and double vision.  Respiratory: Negative for cough and shortness of breath.   Cardiovascular: Positive for leg swelling. Negative for chest pain and palpitations.  Gastrointestinal: Positive for constipation. Negative for  nausea.       GERD  Genitourinary: Negative for hematuria.  Musculoskeletal: Positive for myalgias.  Skin: Negative for rash.  Psychiatric/Behavioral: Positive for depression.  All other systems reviewed and are negative.  Past Medical History:  Diagnosis Date  . Abnormal stress test    a. 02/2017 MV: large region of fixed perfusion defect in basal to mid inf, mid-dist inflat walls, EF 43%. No ischemia (EF 55-60% by f/u echo).  . Arthritis   . Asthma   . Chronic back pain   . Coronary artery calcification seen on CT scan    a. 11/2017 CT Abd/Pelvis: Multi vessel coronary vascular Ca2+.  . Depression   . Diabetes mellitus   . Diabetic neuropathy ([place])   . Difficult intubation    DIFFICULT AIRWAY/FYI  . Family history of adverse reaction to anesthesia    mother had difficlty waking   . Femoral-popliteal bypass graft occlusion, left ([place]) 12/02/2017  . GERD (gastroesophageal reflux disease)   . History of echocardiogram    a. 03/2017 Echo: EF 55-60%, mild LVH, nl RV fxn.  . Hyperlipidemia   . 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 PAD (peripheral artery disease) ([place])   . Paroxysmal atrial fibrillation with rapid ventricular response ([place]) 12/02/2017   a. CHA2DS2VASc = 3-->Xarelto.  Marland Kitchen Ulcer    Foot   Past Surgical History:  Procedure Laterality Date  . ABDOMINAL AORTAGRAM  June 15, 2014  . ABDOMINAL AORTAGRAM N/A 06/15/2014   Procedure: ABDOMINAL Maxcine Ham;  Surgeon: Serafina Mitchell, MD;  Location[facility] CATH LAB;  Service: Cardiovascular;  Laterality: N/A;  . ABDOMINAL AORTAGRAM N/A 11/22/2014   Procedure: ABDOMINAL AORTAGRAM;  Surgeon: Serafina Mitchell, MD;  Location: [place] CATH LAB;  Service: Cardiovascular;  Laterality: N/A;  . ABDOMINAL AORTOGRAM W/LOWER EXTREMITY N/A 01/07/2017   Procedure: Abdominal Aortogram w/Lower Extremity;  Surgeon: Serafina Mitchell, MD;  Location: [place] CV LAB;  Service: Cardiovascular;  Laterality: N/A;  . ABDOMINAL AORTOGRAM W/LOWER EXTREMITY  N/A 10/31/2017   Procedure: ABDOMINAL AORTOGRAM W/LOWER EXTREMITY;  Surgeon: Elam Dutch, MD;  Location: [place] CV LAB;  Service: Cardiovascular;  Laterality: N/A;  . ABDOMINAL AORTOGRAM W/LOWER EXTREMITY N/A 03/24/2018   Procedure: ABDOMINAL AORTOGRAM W/LOWER EXTREMITY;  Surgeon: Serafina Mitchell, MD;  Location: [place] CV LAB;  Service: Cardiovascular;  Laterality: N/A;  . AMPUTATION Left 04/26/2018   Procedure: AMPUTATION ABOVE KNEE;  Surgeon: Elam Dutch, MD;  Location: [place];  Service: Vascular;  Laterality: Left;  . AORTA - BILATERAL FEMORAL ARTERY BYPASS GRAFT N/A 11/28/2017   Procedure: AORTA BIFEMORAL BYPASS USING HEMASHIELD GOLD GRAFT & REIMPLANT IMA;  Surgeon: Serafina Mitchell, MD;  Location[facility] OR;  Service: Vascular;  Laterality: N/A;  . AORTIC ARCH ANGIOGRAPHY N/A 10/31/2017   Procedure: AORTIC ARCH ANGIOGRAPHY;  Surgeon: Elam Dutch, MD;  Location: [place] CV LAB;  Service: Cardiovascular;  Laterality: N/A;  . APPLICATION OF WOUND VAC  11/28/2017   Procedure: APPLICATION OF WOUND VAC;  Surgeon: Serafina Mitchell, MD;  Location: [facility] OR;  Service: Vascular;;  . APPLICATION OF WOUND VAC Left 03/27/2018   Procedure: APPLICATION OF WOUND VAC LEFT GROIN USING PREVENA PLUS;  Surgeon: Serafina Mitchell, MD;  Location: [place];  Service: Vascular;  Laterality: Left;  . ARTERIAL BYPASS SURGERY   07/05/2010   Right Common Femoral to below knee popliteal BPG  . BACK SURGERY     X's  2  . CARDIAC CATHETERIZATION    . CHOLECYSTECTOMY     Gall Bladder  . CYSTECTOMY Left    wrist  . EMBOLECTOMY Left 11/28/2017   Procedure: Left Lower Extremity Embolectomy, Left Tibial Peroneal Trunk Endarterectomy with Patch Angioplasty; Vein Harvest Small Saphenous Graft Left Lower Leg;  Surgeon: [name], MD;  Location: [place];  Service: Vascular;  Laterality: Left;  . FEMORAL-POPLITEAL BYPASS GRAFT Left 03/27/2018   Procedure: THROMBECTOMY OF LEFT FEMORAL TIBIAL BYPASS;   Surgeon: Serafina Mitchell, MD;  Location: [place];  Service: Vascular;  Laterality: Left;  . FEMORAL-TIBIAL BYPASS GRAFT Left 03/27/2018   Procedure: BYPASS GRAFT FEMORAL-TIBIAL ARTERY LEFT REDO USING CRYOPRESERVED SAPHENOUS VEIN 70cm;  Surgeon: Serafina Mitchell, MD;  Location: [facility] OR;  Service: Vascular;  Laterality: Left;  . INTERCOSTAL NERVE BLOCK  November 2015  . INTRAOPERATIVE ARTERIOGRAM  11/28/2017   Procedure: INTRA OPERATIVE ARTERIOGRAM OF LEFT LEG;  Surgeon: Serafina Mitchell, MD;  Location: [place];  Service: Vascular;;  . INTRAOPERATIVE ARTERIOGRAM Left 03/27/2018   Procedure: INTRA OPERATIVE ARTERIOGRAM TIMES TWO;  Surgeon: Serafina Mitchell, MD;  Location: [place];  Service: Vascular;  Laterality: Left;  . IR FLUORO GUIDE CV LINE RIGHT  03/20/2017  . IR FLUORO GUIDE CV LINE RIGHT  05/05/2018  . IR US GUIDE VASC ACCESS RIGHT  03/20/2017  . IR US GUIDE VASC ACCESS RIGHT  05/05/2018  . IRRIGATION AND DEBRIDEMENT BUTTOCKS Right 09/30/2016   Procedure: DEBRIDEMENT RIGHT  BUTTOCK WOUND;  Surgeon: Georganna Skeans, MD;  Location: [place];  Service: General;  Laterality: Right;  . left foot surgery    . PERIPHERAL VASCULAR CATHETERIZATION N/A 05/07/2016   Procedure: Abdominal Aortogram;  Surgeon: Serafina Mitchell, MD;  Location: [place] CV LAB;  Service: Cardiovascular;  Laterality: N/A;  . PERIPHERAL VASCULAR CATHETERIZATION N/A 05/07/2016   Procedure: Lower Extremity Angiography;  Surgeon: Serafina Mitchell, MD;  Location: [place] CV LAB;  Service: Cardiovascular;  Laterality: N/A;  . PERIPHERAL VASCULAR CATHETERIZATION N/A 05/07/2016   Procedure: Aortic Arch Angiography;  Surgeon: Serafina Mitchell, MD;  Location: [place] CV LAB;  Service: Cardiovascular;  Laterality: N/A;  . PERIPHERAL VASCULAR CATHETERIZATION N/A 05/07/2016   Procedure: Upper Extremity Angiography;  Surgeon: Serafina Mitchell, MD;  Location: [place] CV LAB;  Service: Cardiovascular;  Laterality: N/A;  . PERIPHERAL VASCULAR  CATHETERIZATION Right 05/07/2016   Procedure: Peripheral Vascular Balloon Angioplasty;  Surgeon: Serafina Mitchell, MD;  Location: [place] CV LAB;  Service: Cardiovascular;  Laterality: Right;  subclavian  . PERIPHERAL VASCULAR CATHETERIZATION Right 05/07/2016   Procedure: Peripheral Vascular Intervention;  Surgeon: Serafina Mitchell, MD;  Location: [place] CV LAB;  Service: Cardiovascular;  Laterality: Right;  External  Iliac  . SKIN GRAFT Right 2012   [name] by Dr. Nils Pyle- Right and Left Ankle  . SPINE SURGERY    . THROMBECTOMY FEMORAL ARTERY  11/28/2017   Procedure: THROMBECTOMY  & REVISION OF BILATERAL FEMORAL TO POPLETEAL ARTERIES;  Surgeon: Serafina Mitchell, MD;  Location: [facility] OR;  Service: Vascular;;  . TONSILLECTOMY     Family History  Problem Relation Age of Onset  . Coronary artery disease Mother   . Peripheral vascular disease Mother   . Heart disease Mother        Before age 54  . Other Mother        Venous insuffiency  . Diabetes Mother   . Hyperlipidemia Mother   . Hypertension Mother   . Varicose Veins Mother   . Heart attack Mother        before age 37  . Heart disease Father   . Diabetes Father   . Diabetes Maternal Grandmother   . Diabetes Paternal Grandmother   . Diabetes Paternal Grandfather   . Diabetes Sister   . Hypertension Sister   . Diabetes Brother   . Hypertension Brother    Social History:  reports that she quit smoking about 15 months ago. Her smoking use included cigarettes. She quit after 30.00 years of use. She has never used smokeless tobacco. She reports that she does not drink alcohol or use drugs. Allergies:  Allergies  Allergen Reactions  . Lactose Intolerance (Gi)    Medications Prior to Admission  Medication Sig Dispense Refill  . albuterol (PROVENTIL) 2 MG tablet Take 2 mg by mouth 3 (three) times daily as needed for shortness of breath.     Marland Kitchen aspirin EC 81 MG tablet Take 81 mg by mouth every evening.    . cetirizine (ZYRTEC) 10 MG  tablet Take 10 mg by mouth daily.     Marland Kitchen HUMALOG KWIKPEN 100 UNIT/ML KiwkPen Inject 10 Units into the skin See admin instructions. 10 units three times a day before meals per sliding scale    . HYDROcodone-acetaminophen (NORCO) 10-325 MG tablet Take 1 tablet by mouth 2 (two) times daily as needed for severe pain.     Marland Kitchen LANTUS SOLOSTAR 100 UNIT/ML Solostar Pen Inject 36 Units into the skin 2 (two) times daily.     Marland Kitchen loperamide (IMODIUM A-D) 2 MG tablet Take 12 mg by mouth daily as needed for diarrhea or loose stools.    . metoprolol tartrate (LOPRESSOR) 25 MG tablet  Take 0.5 tablets (12.5 mg total) by mouth 2 (two) times daily. 90 tablet 3  . omeprazole (PRILOSEC) 20 MG capsule Take 20 mg by mouth daily.    . ondansetron (ZOFRAN-ODT) 4 MG disintegrating tablet DISSOLVE 1 TABLET IN MOUTH EVERY 8 HOURS AS NEEDED FOR NAUSEA OR VOMITING 20 tablet 0  . pregabalin (LYRICA) 100 MG capsule Limit 1 tab by mouth twice a day to 3 times a day if tolerated (Patient taking differently: Take 100 mg by mouth 2 (two) times daily. ) 90 capsule 0  . rivaroxaban (XARELTO) 20 MG TABS tablet Take 1 tablet (20 mg total) by mouth daily with supper. 30 tablet 2  . rosuvastatin (CRESTOR) 20 MG tablet Take 1 tablet (20 mg total) by mouth daily at 6 PM. 30 tablet 2    Home: Home Living Family/patient expects to be discharged to:: Skilled nursing facility  Functional History: Prior Function Level of Independence: Independent with assistive device(s) Comments: Pt reporting she was using a rollator PTA. Reports she performed ADLs. Functional Status:  Mobility: Bed Mobility Overal bed mobility: Needs Assistance Bed Mobility: Rolling Rolling: Min assist Supine to sit: Min guard, Min assist Sit to supine: Min guard, Min assist General bed mobility comments: Min A to roll for manage LLE Transfers Overall transfer level: Needs assistance Equipment used: 1 person hand held assist Transfers: Sit to/from Stand, Lateral/Scoot  Transfers Sit to Stand: Total assist Stand pivot transfers: Mod assist  Lateral/Scoot Transfers: Mod assist General transfer comment: Max A +2 for sit<>stand x2 with right knee blocked. Using Max A and use of bed pad to facilitate hip movement to recliner Ambulation/Gait General Gait Details: unable    ADL: ADL Overall ADL's : Needs assistance/impaired Eating/Feeding: Set up, Sitting Eating/Feeding Details (indicate cue type and reason): Set up for lunch Grooming: Sitting, Wash/dry face, Set up, Supervision/safety Grooming Details (indicate cue type and reason): washing face while sitting in recliner Upper Body Bathing: Minimal assistance, Sitting Lower Body Bathing: Maximal assistance, Sitting/lateral leans, Bed level Upper Body Dressing : Minimal assistance, Sitting Lower Body Dressing: Maximal assistance, Sitting/lateral leans, Bed level Toilet Transfer: Maximal assistance, +2 for physical assistance, Stand-pivot(Drop arm recliner) Toilet Transfer Details (indicate cue type and reason): Pt performing stand pivot transfer with Max A +2 to drop arm recliner. Pt requiring R knee blocked and use of bed pad to lift and manage hips Toileting- Clothing Manipulation and Hygiene: Maximal assistance, Bed level Toileting - Clothing Manipulation Details (indicate cue type and reason): Pt requiring Min A to manage LLE for rolling to be placed on bed pad. Pt requiring Max A to performing toilet hygiene to clean up after BM in bed before placing on bed pan. Functional mobility during ADLs: Maximal assistance, +2 for physical assistance(stand pivot only to drop arm recliner) General ADL Comments: Upon arrival, pt stating she had been waiting to be placed on bed pan. Pt agreeable to attempt lateral scoot to [facility], but stating she thinks she has already had BM in bed. Pt requiring Min A to manage LLE during roll to perform toilet hygiene and place in bed pan. Pt crying at end of session because she is upset  she soiled herself in bed. Upon checking call system, pt has been waiting 10 minutes before OT arrival.   Cognition: Cognition Overall Cognitive Status: No family/caregiver present to determine baseline cognitive functioning Orientation Level: Oriented X4 Cognition Arousal/Alertness: Awake/alert Behavior During Therapy: WFL for tasks assessed/performed Overall Cognitive Status: No family/caregiver present to determine baseline  cognitive functioning General Comments: Upon arrival, pt verablizing she had waiting and called for assistance for the bed pan for awhile. During toileting at bed level, pt becoming tearful stating "I don't want to poop on myself."  Blood pressure 136/62, pulse 68, temperature 97.9 F (36.6 C), temperature source Oral, resp. rate 17, height 5\' 7"  (1.702 m), weight 87.3 kg (192 lb 7.4 oz), SpO2 95 %. Physical Exam  Constitutional: She is oriented to person, place, and time. She appears well-developed.  HENT:  Head: Normocephalic.     Eyes:  Dysconjugate gaze  Neck: Normal range of motion.  Cardiovascular: Normal rate.  Respiratory: Effort normal.  GI: Soft.  Musculoskeletal: She exhibits edema (1++ [name]).  Neurological: She is alert and oriented to person, place, and time.  Decreased LT up to right knee. UE 5/5. [name] 3-4/5. Able to lift LLE off bed  Skin:  Amputation site clean with staples, uncovered  Psychiatric: She has a normal mood and affect. Her behavior is normal.    Results for orders placed or performed during the hospital encounter of 04/24/18 (from the past 24 hour(s))  Glucose, capillary     Status: Abnormal   Collection Time: 05/13/18 11:36 AM  Result Value Ref Range   Glucose-Capillary 110 (H) 70 - 99 mg/dL  Glucose, capillary     Status: Abnormal   Collection Time: 05/13/18  4:03 PM  Result Value Ref Range   Glucose-Capillary 57 (L) 70 - 99 mg/dL  Glucose, capillary     Status: Abnormal   Collection Time: 05/13/18  4:23 PM  Result Value  Ref Range   Glucose-Capillary 131 (H) 70 - 99 mg/dL  Glucose, capillary     Status: Abnormal   Collection Time: 05/13/18  9:50 PM  Result Value Ref Range   Glucose-Capillary 158 (H) 70 - 99 mg/dL  CBC     Status: Abnormal   Collection Time: 05/14/18  6:28 AM  Result Value Ref Range   WBC 8.4 4.0 - 10.5 K/uL   RBC 3.38 (L) 3.87 - 5.11 MIL/uL   Hemoglobin 8.7 (L) 12.0 - 15.0 g/dL   HCT 29.4 (L) 36.0 - 46.0 %   MCV 87.0 78.0 - 100.0 fL   MCH 25.7 (L) 26.0 - 34.0 pg   MCHC 29.6 (L) 30.0 - 36.0 g/dL   RDW 21.8 (H) 11.5 - 15.5 %   Platelets 232 150 - 400 K/uL  Renal function panel     Status: Abnormal   Collection Time: 05/14/18  6:28 AM  Result Value Ref Range   Sodium 139 135 - 145 mmol/L   Potassium 4.1 3.5 - 5.1 mmol/L   Chloride 103 98 - 111 mmol/L   CO2 26 22 - 32 mmol/L   Glucose, Bld 125 (H) 70 - 99 mg/dL   BUN 30 (H) 6 - 20 mg/dL   Creatinine, Ser 4.90 (H) 0.44 - 1.00 mg/dL   Calcium 8.2 (L) 8.9 - 10.3 mg/dL   Phosphorus 4.4 2.5 - 4.6 mg/dL   Albumin 1.9 (L) 3.5 - 5.0 g/dL   GFR calc non Af Amer 9 (L) >60 mL/min   GFR calc Af Amer 11 (L) >60 mL/min   Anion gap 10 5 - 15  Magnesium     Status: None   Collection Time: 05/14/18  6:28 AM  Result Value Ref Range   Magnesium 1.8 1.7 - 2.4 mg/dL  Glucose, capillary     Status: Abnormal   Collection Time: 05/14/18  8:03 AM  Result Value Ref Range   Glucose-Capillary 123 (H) 70 - 99 mg/dL   No results found.   Assessment/Plan: Diagnosis: 52 yo female with PAD s/p left BKA, sepsis, subsequent deconditioning 1. Does the need for close, 24 hr/day medical supervision in concert with the patient's rehab needs make it unreasonable for this patient to be served in a less intensive setting? Yes 2. Co-Morbidities requiring supervision/potential complications: DM2 with DPN, PAF,  3. Due to bladder management, bowel management, safety, skin/wound care, disease management, medication administration, pain management and patient  education, does the patient require 24 hr/day rehab nursing? Yes 4. Does the patient require coordinated care of a physician, rehab nurse, PT (1-2 hrs/day, 5 days/week) and OT (1-2 hrs/day, 5 days/week) to address physical and functional deficits in the context of the above medical diagnosis(es)? Yes Addressing deficits in the following areas: balance, endurance, locomotion, strength, transferring, bowel/bladder control, bathing, dressing, feeding, grooming, toileting and psychosocial support 5. Can the patient actively participate in an intensive therapy program of at least 3 hrs of therapy per day at least 5 days per week? Yes 6. The potential for patient to make measurable gains while on inpatient rehab is excellent 7. Anticipated functional outcomes upon discharge from inpatient rehab are modified independent  with PT, modified independent with OT, n/a with SLP. 8. Estimated rehab length of stay to reach the above functional goals is: 9-14 days 9. Anticipated D/C setting: Home 10. Anticipated post D/C treatments: HH therapy and Outpatient therapy 11. Overall Rehab/Functional Prognosis: excellent  RECOMMENDATIONS: This patient's condition is appropriate for continued rehabilitative care in the following setting: CIR Patient has agreed to participate in recommended program. Yes Note that insurance prior authorization may be required for reimbursement for recommended care.  Comment: Rehab Admissions Coordinator to follow up.  Thanks,  Jamie Staggers, MD, [name]  I have personally performed a face to face diagnostic evaluation of this patient. Additionally, I have reviewed and concur with the physician assistant's documentation above.     Lavon Paganini Angiulli, PA-C 05/14/2018

## 2018-05-15 DIAGNOSIS — S31829D Unspecified open wound of left buttock, subsequent encounter: Secondary | ICD-10-CM

## 2018-05-15 LAB — GLUCOSE, CAPILLARY
GLUCOSE-CAPILLARY: 141 mg/dL — AB (ref 70–99)
GLUCOSE-CAPILLARY: 134 mg/dL — AB (ref 70–99)
Glucose-Capillary: 153 mg/dL — ABNORMAL HIGH (ref 70–99)
Glucose-Capillary: 159 mg/dL — ABNORMAL HIGH (ref 70–99)
Glucose-Capillary: 151 mg/dL — ABNORMAL HIGH (ref 70–99)

## 2018-05-15 LAB — RENAL FUNCTION PANEL
ANION GAP: 10 (ref 5–15)
Albumin: 2 g/dL — ABNORMAL LOW (ref 3.5–5.0)
BUN: 33 mg/dL — AB (ref 6–20)
CALCIUM: 8.1 mg/dL — AB (ref 8.9–10.3)
CHLORIDE: 101 mmol/L (ref 98–111)
CO2: 27 mmol/L (ref 22–32)
Creatinine, Ser: 5.13 mg/dL — ABNORMAL HIGH (ref 0.44–1.00)
GFR calc Af Amer: 10 mL/min — ABNORMAL LOW (ref >60)
GFR calc non Af Amer: 9 mL/min — ABNORMAL LOW (ref >60)
GLUCOSE: 140 mg/dL — AB (ref 70–99)
PHOSPHORUS: 4.7 mg/dL — AB (ref 2.5–4.6)
POTASSIUM: 3.7 mmol/L (ref 3.5–5.1)
Sodium: 138 mmol/L (ref 135–145)

## 2018-05-15 LAB — CBC
HEMATOCRIT: 29.6 % — AB (ref 36.0–46.0)
Hemoglobin: 8.9 g/dL — ABNORMAL LOW (ref 12.0–15.0)
MCH: 26 pg (ref 26.0–34.0)
MCHC: 30.1 g/dL (ref 30.0–36.0)
MCV: 86.5 fL (ref 78.0–100.0)
Platelets: 265 10*3/uL (ref 150–400)
RBC: 3.42 MIL/uL — ABNORMAL LOW (ref 3.87–5.11)
RDW: 21.7 % — ABNORMAL HIGH (ref 11.5–15.5)
WBC: 8.2 10*3/uL (ref 4.0–10.5)

## 2018-05-15 LAB — MAGNESIUM: Magnesium: 2.1 mg/dL (ref 1.7–2.4)

## 2018-05-15 NOTE — Care Management Important Message (Signed)
Important Message  Patient Details  Name: [name] MRN: [id number] Date of Birth: 04-18-1966   Medicare Important Message Given:  Yes    [name] P Knierim 05/15/2018, 3:20 PM

## 2018-05-15 NOTE — Progress Notes (Signed)
Physical Therapy Treatment Patient Details Name: [name] MRN: [id number] DOB: 25-Apr-1966 Today's Date: 05/15/2018    History of Present Illness 52 year old female with PMH of DM, PAD, A.Fib on Xarelto, Chronic Wounds of Left Foot with H/O of ABF and Fem-Pop Bypass. Recent Admission 5/10-5/14 for redo of Left femoral to PTA bypass with cryopreserved vein. Post-Op patient lost pulse to left foot and was taken back to OR for thrombectomy. On 6/3 patient went to outpatient vascular appointment where she was found to by hypotensive and febrile with a occlusion noted to bypass graft. 6/7 patient presented to ED with AMS with pain to left lower leg and emesis and diarrhea. 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  Course has been complicated by AKI and NSTEMI.  Now requiring intermittent HD. ETT 6/11-6/14.     PT Comments    Patient reporting feeling weaker today than prior PT visits, emotional and distressed. "All I do right now is lay in bed and get weak, I want to get stronger and I'm determined". Patient with frequent rest breaks during visit, unable to tolerate standing with Max x2 this visit. Focused on scooting transfers to bedside chair, pt limited by pain from sacral skin breakdown and weakness. Ultimately required max A utilizing bed pad. Discussed importance of positional changes in bed, reviewed therex with pt and husband, and encouraged patient to advocate for herself with nursing staff. Reassured patient that therapy will help her regain functional mobility.     Follow Up Recommendations  SNF     Equipment Recommendations  None recommended by PT    Recommendations for Other Services       Precautions / Restrictions Precautions Precautions: Fall Restrictions Other Position/Activity Restrictions: NWB LLE at residual limb    Mobility  Bed Mobility Overal bed mobility: Needs Assistance Bed Mobility: Rolling;Supine to Sit Rolling: Min assist;+2 for physical assistance    Supine to sit: +2 for physical assistance;Mod assist     General bed mobility comments: Mod A x2 for supine to sit today, powering up trunk and helping move legs over EOB  Transfers Overall transfer level: Needs assistance Equipment used: 1 person hand held assist Transfers: Sit to/from Stand;Lateral/Scoot Transfers Sit to Stand: Total assist        Lateral/Scoot Transfers: Mod assist General transfer comment: Patient unable to tolerate standing depsite max A, feels weaker today. Focused on lateral scooting in bed into chair with lift pad. unable to perform lateral transfer indepenently due to weakness and pain, +2 assist utilizing bed pad to bring patient into chair max A  Ambulation/Gait             General Gait Details: unable   Stairs             Wheelchair Mobility    Modified Rankin (Stroke Patients Only)       Balance Overall balance assessment: Needs assistance Sitting-balance support: Feet supported;Single extremity supported Sitting balance-Leahy Scale: Fair Sitting balance - Comments: Able to maintain static standing.   Standing balance support: Bilateral upper extremity supported;During functional activity Standing balance-Leahy Scale: Zero                              Cognition Arousal/Alertness: Awake/alert Behavior During Therapy: WFL for tasks assessed/performed  General Comments: patient emotional at times crying over wanting to get stronger.      Exercises General Exercises - Lower Extremity Quad Sets: 10 reps Long Arc Quad: 15 reps Hip ABduction/ADduction: 10 reps Straight Leg Raises: 10 reps    General Comments        Pertinent Vitals/Pain Pain Assessment: Faces Faces Pain Scale: Hurts even more Pain Location: L leg and sacral area Pain Descriptors / Indicators: Headache;Grimacing Pain Intervention(s): Limited activity within patient's tolerance;Monitored during  session;Premedicated before session;Repositioned    Home Living                      Prior Function            PT Goals (current goals can now be found in the care plan section) Acute Rehab PT Goals Patient Stated Goal: to get stronger PT Goal Formulation: With patient Time For Goal Achievement: 05/22/18 Potential to Achieve Goals: Good Progress towards PT goals: Progressing toward goals    Frequency    Min 2X/week      PT Plan Current plan remains appropriate    Co-evaluation              AM-PAC PT "6 Clicks" Daily Activity  Outcome Measure  Difficulty turning over in bed (including adjusting bedclothes, sheets and blankets)?: A Little Difficulty moving from lying on back to sitting on the side of the bed? : Unable Difficulty sitting down on and standing up from a chair with arms (e.g., wheelchair, bedside commode, etc,.)?: Unable Help needed moving to and from a bed to chair (including a wheelchair)?: A Lot Help needed walking in hospital room?: Total Help needed climbing 3-5 steps with a railing? : Total 6 Click Score: 9    End of Session Equipment Utilized During Treatment: Gait belt Activity Tolerance: Patient limited by fatigue Patient left: with call bell/phone within reach;in chair Nurse Communication: Mobility status PT Visit Diagnosis: Unsteadiness on feet (R26.81);Muscle weakness (generalized) (M62.81)     Time: 7972-8206 PT Time Calculation (min) (ACUTE ONLY): 43 min  Charges:  $Therapeutic Exercise: 8-22 mins $Therapeutic Activity: 23-37 mins                    G Codes:      Reinaldo Berber, PT, DPT Acute Rehab Services Pager: [phone]     Reinaldo Berber 05/15/2018, 11:58 AM

## 2018-05-15 NOTE — Progress Notes (Signed)
[facility] NEPHROLOGY PROGRESS NOTE  Assessment/ Plan: Pt is a 52 y.o. yo female with history of diabetes, peripheral artery disease, A. fib, admitted with septic shock due to left lower extremity gangrenous with multiorgan dysfunction, non-STEMI, EF of 30 to 35%.  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  Developed and anuric AKI requiring intermittent dialysis.  Assessment/Plan:  # AKI from ATN in the setting of septic shock: Started dialysis on 6/12, tunneled catheter was placed with IR on 6/19.  Last dialysis was on 6/22.  -Patient was started on IV Lasix since 6/25 with urine output of 2100 cc/24 hr. Serum creatinine level elevated to 5.13.  Still with volume overload on exam.  Continue IV Lasix 80 mg q8. Plan to continue diuretics with close monitoring of urine output and labs.  No plan for dialysis today.  Daily assessment for the need of dialysis. -watch for renal recovery. -Discussed with the patient and her husband.  #Septic shock: Status post antibiotics, stress dose steroid, resolved now.  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)  # NSTEMI; may need cath, new RWMA on echo. Monitor kidney function.  # h/o Afib with RVR  Discussed with the primary team. Patient will likely go to inpatient rehab.  Subjective:  Seen and examined at bedside.  Denies headache, dizziness, nausea vomiting chest pain shortness of breath. Objective Vital signs in last 24 hours: Vitals:   05/14/18 1230 05/14/18 2051 05/15/18 0429 05/15/18 1000  BP: (!) 75/43 (!) 88/39 (!) 118/59 111/60  Pulse: 62 64 76 82  Resp: 18 18 18    Temp: 97.9 F (36.6 C) 98.2 F (36.8 C) 98.1 F (36.7 C)   TempSrc: Oral Oral Oral   SpO2: 100% 94% 95%   Weight:   86.3 kg (190 lb 3.2 oz)   Height:       Weight change: -1.026 kg (-2 lb 4.2 oz)  Intake/Output Summary (Last 24 hours) at 05/15/2018 1112 Last data filed at 05/15/2018 1013 Gross per 24 hour  Intake 720 ml  Output  1700 ml  Net -980 ml       Labs: Basic Metabolic Panel: Recent Labs  Lab 05/13/18 0636 05/14/18 0628 05/15/18 0729  NA 137 139 138  K 4.4 4.1 3.7  CL 101 103 101  CO2 27 26 27   GLUCOSE 118* 125* 140*  BUN 25* 30* 33*  CREATININE 4.54* 4.90* 5.13*  CALCIUM 8.0* 8.2* 8.1*  PHOS 4.1 4.4 4.7*   Liver Function Tests: Recent Labs  Lab 05/13/18 0636 05/14/18 0628 05/15/18 0729  ALBUMIN 1.8* 1.9* 2.0*   No results for input(s): LIPASE, AMYLASE in the last 168 hours. No results for input(s): AMMONIA in the last 168 hours. CBC: Recent Labs  Lab 05/11/18 0626 05/12/18 0531 05/13/18 0635 05/14/18 0628 05/15/18 0729  WBC 10.4 9.3 8.9 8.4 8.2  HGB 8.7* 8.5* 7.9* 8.7* 8.9*  HCT 29.9* 28.9* 27.1* 29.4* 29.6*  MCV 86.9 88.4 87.4 87.0 86.5  PLT 242 242 219 232 265   Cardiac Enzymes: No results for input(s): CKTOTAL, CKMB, CKMBINDEX, TROPONINI in the last 168 hours. CBG: Recent Labs  Lab 05/14/18 0803 05/14/18 1228 05/14/18 1818 05/14/18 2120 05/15/18 0733  GLUCAP 123* 109* 156* 126* 134*    Iron Studies: No results for input(s): IRON, TIBC, TRANSFERRIN, FERRITIN in the last 72 hours. Studies/Results: No results found.  Medications: Infusions: . sodium chloride 10 mL/hr at 05/08/18 0027    Scheduled Medications: .  amiodarone  400 mg Oral Daily  . aspirin  81 mg Oral Daily  . chlorhexidine  15 mL Mouth Rinse BID  . Chlorhexidine Gluconate Cloth  6 each Topical Q0600  . darbepoetin (ARANESP) injection - DIALYSIS  100 mcg Intravenous Q Thu-HD  . diphenoxylate-atropine  2 tablet Oral QID  . ferrous sulfate  325 mg Oral BID WC  . furosemide  80 mg Intravenous Q8H  . heparin injection (subcutaneous)  5,000 Units Subcutaneous Q8H  . insulin aspart  0-5 Units Subcutaneous QHS  . insulin aspart  0-9 Units Subcutaneous TID WC  . mouth rinse  15 mL Mouth Rinse q12n4p  . metoprolol tartrate  12.5 mg Oral BID  . multivitamin  1 tablet Oral QHS  . pantoprazole  40  mg Oral BID AC  . rosuvastatin  20 mg Oral q1800  . sodium chloride flush  10-40 mL Intracatheter Q12H    have reviewed scheduled and prn medications.  Physical Exam: General: NAD, comfortable Heart: Regular rate rhythm S1-S2 normal Lungs: Clear bilateral, no wheezing. Abdomen:soft, Non-tender, non-distended Extremities: right leg pitting edema ++, unchanged Dialysis Access:RIJ TDC  Daiveon Markman Prasad Dontrail Blackwell 05/15/2018,11:12 AM  LOS: 20 days

## 2018-05-15 NOTE — Progress Notes (Signed)
PROGRESS NOTE  [name]  HAL:[id number] DOB: 07-18-1966 DOA: 04/24/2018 PCP: [name], MD   Brief Narrative: [name] is a 52 y.o. female with a history of uncontrolled T2DM with neuropathy, PAD s/p left fem-pop bypass graft occlusion Jan 2019, right fibula osteomyelitis, PAF on xarelto, HLD, and depression with cognitive impairment who presented to the ED with septic shock due to left leg gangrene, NSTEMI and new LV dysfunction (EF 30-35%) with new wall motion abnormality. She was admitted  6/7. 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. Septic shock resolved but was complicated by oliguric renal failure requiring intermittent HD. Amiodarone has been started for rate control. Anticoagulation has been stopped due to suspected GI bleeding.  Assessment & Plan: Principal Problem:   Septic shock ([place]) Active Problems:   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)   PAD (peripheral artery disease) (HCC)   Pressure injury of skin   DM2 (diabetes mellitus, type 2) (HCC)   Acute kidney injury ([place])   Buttock wound, left, subsequent encounter   Paroxysmal atrial fibrillation with rapid ventricular response (HCC)   Lower limb ischemia   Microcytic anemia   Gangrene of lower extremity (HCC)   Acute renal failure (HCC)   Acute respiratory failure (HCC)   Sepsis (HCC)   Acute encephalopathy   Acute combined systolic and diastolic heart failure (HCC)   Non-ST elevation (NSTEMI) myocardial infarction ([place])  Left lower extremity gangrene, PAD and septic shock:  - 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.  - Off abx  Oliguric acute renal failure with ATN: Due to septic shock.  - Intermittent HD started 6/12. D/w nephrology. Creatinine worsening and UOP is improving. Continuing diuretics and daily Cr monitoring. No urgent HD needs.    NSTEMI: Felt to be due to demand in setting of septic shock. With known coronary calcifications, now regional wall motion abnormality.  - Cardiology  hoping to avoid contrast with cath as we monitor for renal recovery.  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  - Address volume status with diuretics and HD if needed. Remains peripherally overloaded but in no respiratory distress.  - On beta blocker - BP will not tolerate ACE/ARB, spironolactone.   PAF: With RVR now resolved.  - Continue metoprolol and amiodarone (new)  Acute on anemia of chronic disease: s/p 1u PRBCs 6/20 - Continue aranesp per nephrology - FOBT+, so heparin discontinued per cardiology. If develops gross bleeding, would need urgent GI consult.  - Continue po iron (ferritin 234, iron 29)  T2DM: HbA1c 8.4%.  - SSI AC/HS, at inpatient goal. This is well below her outpatient lantus 36u BID.   Developmental delay/cognitive deficits: Per history. Pt currently seems able to understand hospital course.   Diarrhea: Improving. Previous CDiff negative.  - Lomotil prn.   DVT prophylaxis: SCDs Code Status: Full Family Communication: Husband at bedside Disposition Plan: Pending declaration of ESRD vs. renal recovery. CIR consulted but will not take her until this is decided.   Consultants:   Nephrology  Cardiology  PCCM  Procedures:  Echo 04/28/2018 LV EF: 30% - 35% Study Conclusions - Left ventricle: The cavity size was normal. There was mild concentric hypertrophy. Systolic function was moderately to severely reduced. The estimated ejection fraction was in the range of 30% to 35%. Features are consistent with a pseudonormal left ventricular filling pattern, with concomitant abnormal relaxation and increased filling pressure (grade 2 diastolic dysfunction). Doppler parameters  are consistent with elevated ventricular end-diastolic filling pressure. - Mitral valve: Calcified annulus. Moderately thickened, moderately calcified leaflets . There was mild regurgitation. - Left atrium: The atrium was mildly  dilated. - Right ventricle: The cavity size was moderately dilated. Wall thickness was normal. Systolic function was moderately reduced. - Tricuspid valve: There was mild regurgitation. - Pulmonary arteries: Systolic pressure was mildly increased. PA peak pressure: 34 mm Hg (S). - Pericardium, extracardiac: A trivial pericardial effusion was identified. Features were not consistent with tamponade physiology.  Impressions: - When compared to the prior study from 04/17/2017 LVEF has decreased from 55-60% to 30-35% with new wall motion abnormalities - diffuse hypokinesis and akinesis of the basal and mid anteroseptal, anterior and apical septal and anterior walls. RVEF is moderately decreased.  Antimicrobials:  Vancomycin, zosyn 6/7 - 6/10  Ceftazidime 6/10 - 6/14  Subjective: Worked with PT and is exhausted. No new symptoms.   Objective: Vitals:   05/15/18 0429 05/15/18 1000 05/15/18 1147 05/15/18 1149  BP: (!) 118/59 111/60 (!) 104/47 (!) 100/57  Pulse: 76 82 77 77  Resp: 18  18 18   Temp: 98.1 F (36.7 C)  98 F (36.7 C)   TempSrc: Oral  Oral   SpO2: 95%  96% 96%  Weight: 86.3 kg (190 lb 3.2 oz)     Height:        Intake/Output Summary (Last 24 hours) at 05/15/2018 1535 Last data filed at 05/15/2018 1409 Gross per 24 hour  Intake 720 ml  Output 2150 ml  Net -1430 ml   Filed Weights   05/13/18 0418 05/14/18 0446 05/15/18 0429  Weight: 87.7 kg (193 lb 5.5 oz) 87.3 kg (192 lb 7.4 oz) 86.3 kg (190 lb 3.2 oz)   Gen: 52 y.o. female in no distress Pulm: Nonlabored breathing room air. Clear. CV: Regular rate and rhythm. No murmur, rub, or gallop. No JVD, 2+ dependent edema. GI: Abdomen soft, non-tender, non-distended, with normoactive bowel sounds.  Ext: Warm, no deformities Skin: Left stump site without dehiscence, discharge or erythema.  Neuro: Alert and oriented. No focal neurological deficits. Psych: Judgement and insight appear fair. Mood euthymic  & affect congruent. Behavior is appropriate.    CBC: Recent Labs  Lab 05/11/18 0626 05/12/18 0531 05/13/18 0635 05/14/18 0628 05/15/18 0729  WBC 10.4 9.3 8.9 8.4 8.2  HGB 8.7* 8.5* 7.9* 8.7* 8.9*  HCT 29.9* 28.9* 27.1* 29.4* 29.6*  MCV 86.9 88.4 87.4 87.0 86.5  PLT 242 242 219 232 536   Basic Metabolic Panel: Recent Labs  Lab 05/09/18 0743  05/11/18 0626 05/12/18 0531 05/13/18 0635 05/13/18 0636 05/14/18 0628 05/15/18 0729  NA 137   < > 139 139  --  137 139 138  K 3.8   < > 4.2 4.4  --  4.4 4.1 3.7  CL 102   < > 102 100  --  101 103 101  CO2 27   < > 27 30  --  27 26 27   GLUCOSE 112*   < > 105* 130*  --  118* 125* 140*  BUN 25*   < > 20 21*  --  25* 30* 33*  CREATININE 3.65*   < > 3.49* 4.07*  --  4.54* 4.90* 5.13*  CALCIUM 8.4*   < > 8.1* 8.2*  --  8.0* 8.2* 8.1*  MG  --    < > 1.6* 2.1 1.9  --  1.8 2.1  PHOS 5.2*  --   --  3.8  --  4.1 4.4 4.7*   < > = values in this interval not displayed.   GFR: Estimated Creatinine Clearance: 14.2 mL/min (A) (by C-G formula based on SCr of 5.13 mg/dL (H)). Liver Function Tests: Recent Labs  Lab 05/09/18 0743 05/12/18 0531 05/13/18 0636 05/14/18 0628 05/15/18 0729  ALBUMIN 1.9* 1.9* 1.8* 1.9* 2.0*   No results for input(s): LIPASE, AMYLASE in the last 168 hours. No results for input(s): AMMONIA in the last 168 hours. Coagulation Profile: No results for input(s): INR, PROTIME in the last 168 hours. Cardiac Enzymes: No results for input(s): CKTOTAL, CKMB, CKMBINDEX, TROPONINI in the last 168 hours. BNP (last 3 results) No results for input(s): PROBNP in the last 8760 hours. HbA1C: No results for input(s): HGBA1C in the last 72 hours. CBG: Recent Labs  Lab 05/14/18 1633 05/14/18 1818 05/14/18 2120 05/15/18 0733 05/15/18 1145  GLUCAP 141* 156* 126* 134* 153*   Lipid Profile: No results for input(s): CHOL, HDL, LDLCALC, TRIG, CHOLHDL, LDLDIRECT in the last 72 hours. Thyroid Function Tests: No results for  input(s): TSH, T4TOTAL, FREET4, T3FREE, THYROIDAB in the last 72 hours. Anemia Panel: No results for input(s): VITAMINB12, FOLATE, FERRITIN, TIBC, IRON, RETICCTPCT in the last 72 hours. Urine analysis:    Component Value Date/Time   COLORURINE YELLOW 04/25/2018 0407   APPEARANCEUR CLOUDY (A) 04/25/2018 0407   LABSPEC 1.012 04/25/2018 0407   PHURINE 5.0 04/25/2018 0407   GLUCOSEU NEGATIVE 04/25/2018 0407   HGBUR MODERATE (A) 04/25/2018 0407   BILIRUBINUR NEGATIVE 04/25/2018 0407   KETONESUR NEGATIVE 04/25/2018 0407   PROTEINUR 100 (A) 04/25/2018 0407   UROBILINOGEN 0.2 06/25/2011 1707   NITRITE NEGATIVE 04/25/2018 0407   LEUKOCYTESUR NEGATIVE 04/25/2018 0407   No results found for this or any previous visit (from the past 240 hour(s)).    Radiology Studies: No results found.  Scheduled Meds: . amiodarone  400 mg Oral Daily  . aspirin  81 mg Oral Daily  . chlorhexidine  15 mL Mouth Rinse BID  . Chlorhexidine Gluconate Cloth  6 each Topical Q0600  . darbepoetin (ARANESP) injection - DIALYSIS  100 mcg Intravenous Q Thu-HD  . diphenoxylate-atropine  2 tablet Oral QID  . ferrous sulfate  325 mg Oral BID WC  . furosemide  80 mg Intravenous Q8H  . heparin injection (subcutaneous)  5,000 Units Subcutaneous Q8H  . insulin aspart  0-5 Units Subcutaneous QHS  . insulin aspart  0-9 Units Subcutaneous TID WC  . mouth rinse  15 mL Mouth Rinse q12n4p  . metoprolol tartrate  12.5 mg Oral BID  . multivitamin  1 tablet Oral QHS  . pantoprazole  40 mg Oral BID AC  . rosuvastatin  20 mg Oral q1800  . sodium chloride flush  10-40 mL Intracatheter Q12H   Continuous Infusions: . sodium chloride 10 mL/hr at 05/08/18 0027     LOS: 20 days   Time spent: 15 minutes.  Patrecia Pour, MD [name] [email or web address] Password [facility] 05/15/2018, 3:35 PM

## 2018-05-15 NOTE — Progress Notes (Signed)
Inpatient Rehabilitation-Admissions Coordinator   [facility] is continuing to follow pt progress. Of Note, CIR does not accept pts who have AKI and are requiring daily assessments for dialysis. The pt must either be clipped for ESRD or have AKI resolved prior to admit to CIR. AC will await medical readiness for rehab and will then pursue insurance authorization if CIR still needed at that time. Please call if questions.   Jhonnie Garner, OTR/L  Rehab Admissions Coordinator  [phone] 05/15/2018 11:51 AM

## 2018-05-15 NOTE — Clinical Social Work Note (Signed)
CSW continues to follow for discharge needs.  Jeris Roser, CSW [phone]  

## 2018-05-15 NOTE — PMR Pre-admission (Addendum)
PMR Admission Coordinator Pre-Admission Assessment  Patient: [name] is an 52 y.o., female MRN: [id number] DOB: [birth year] Height: '5\' 7"'  (170.2 cm) Weight: 74.8 kg (164 lb 14.5 oz)              Insurance Information HMO:     PPO: Yes     PCP:      IPA:      80/20:      OTHER:  PRIMARY: UHC Medicare ([facility])      Policy#: [id number]      Subscriber: Patient CM Name: Creig Hines     Phone#: [phone] ext [phone]     Fax#: [phone] [facility] received Josem Kaufmann from [place] [facility]. Pt is approved for 7 days (with admit date 05/22/18) and clinical updates due by 7/12 to Creig Hines (f) [phone] Pre-Cert#: [id number]      Employer:  Benefits:  Phone #: NA     Name: online portal: [email or web address] Eff. Date: 11/18/17     Deduct: $0 (met $0)      Out of Pocket Max: $4,500 (met $4,500)     Life Max: NA CIR: $345/day for days 1-5, $0/day for days 6+      SNF: $0/day for days 1-20, $160/day for days 21-49, $0/day for days 50-100; 100 day SNF limit Outpatient: per necessity      Co-Pay: $40/visit Home Health: 100% per necessity      Co-Pay:  DME: 80%     Co-Pay: 20% Providers:   Medicaid Application Date:       Case Manager:  Disability Application Date:       Case Worker:   Emergency Contact Information Contact Information    Name Relation Home Work [place] Spouse [phone]  [phone]   Collier Flowers   [phone]     Current Medical History  Patient Admitting Diagnosis: 53 yo female with PAD s/p left AKA, sepsis, subsequent deconditioning  History of Present Illness: Gertha Lichtenberg is a 52 year old right-handed female with history of atrial fibrillation on Xarelto, diabetes mellitus with peripheral vascular disease.  Per chart review patient lives with spouse.  Used a rolling walker prior to admission.  Patient with recent admission 03/27/2018 until 03/31/2018 for redo of left femoral to PTA bypass.  Postoperatively at that time  needed thrombectomy.  Recent followed by vascular surgery with findings of occluded bypass graft.  Presented 04/25/2018 with left leg pain and ischemic changes.  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.  Hospital course pain management.  Noted right buttocks chronic stage III pressure ulcer with dressing changes as directed with follow-up per wound care nurse.  Patient developed septic shock follow-up per critical care services.  Findings of elevated troponin felt to be related to non-ST elevation MI in the setting of hypotension with cardiology follow-up initially placed on intravenous heparin.  Patient developed atrial fibrillation 05/07/2018 maintained on amiodarone.  Considerations were made for possible need for cardiac catheterization however currently on hold due to renal insufficiency.  Echocardiogram with ejection fraction of 30% grade 2 diastolic dysfunction.  Systolic function was moderately to severely reduced.  AKI in the setting of septic shock consistent with ischemic ATN with CK of 1049.  Initially maintained on intravenous fluids per renal services creatinine 4.90 hemodialysis was initiated 04/29/2018 with tunneled catheter placed.  Her last dialysis session was 05/09/2018 and latest creatinine 4.08.  Currently  on subcutaneous heparin for DVT prophylaxis.  Await decision to resume Xarelto.  Physical occupational therapy evaluations completed with recommendations of physical medicine rehab consult.  Patient is to be admitted for a comprehensive rehab program on 05/22/18.        Past Medical History  Past Medical History:  Diagnosis Date  . Abnormal stress test    a. 02/2017 MV: large region of fixed perfusion defect in basal to mid inf, mid-dist inflat walls, EF 43%. No ischemia (EF 55-60% by f/u echo).  . Arthritis   . Asthma   . Chronic back pain   . Coronary artery calcification seen on CT scan    a. 11/2017 CT Abd/Pelvis: Multi  vessel coronary vascular Ca2+.  . Depression   . Diabetes mellitus   . Diabetic neuropathy ([place])   . Difficult intubation    DIFFICULT AIRWAY/FYI  . Family history of adverse reaction to anesthesia    mother had difficlty waking   . Femoral-popliteal bypass graft occlusion, left ([place]) 12/02/2017  . GERD (gastroesophageal reflux disease)   . History of echocardiogram    a. 03/2017 Echo: EF 55-60%, mild LVH, nl RV fxn.  . Hyperlipidemia   . 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 PAD (peripheral artery disease) ([place])   . Paroxysmal atrial fibrillation with rapid ventricular response ([place]) 12/02/2017   a. CHA2DS2VASc = 3-->Xarelto.  Marland Kitchen Ulcer    Foot    Family History  family history includes Coronary artery disease in her mother; Diabetes in her brother, father, maternal grandmother, mother, paternal grandfather, paternal grandmother, and sister; Heart attack in her mother; Heart disease in her father and mother; Hyperlipidemia in her mother; Hypertension in her brother, mother, and sister; Other in her mother; Peripheral vascular disease in her mother; Varicose Veins in her mother.  Prior Rehab/Hospitalizations:  Has the patient had major surgery during 100 days prior to admission? Yes  Current Medications   Current Facility-Administered Medications:  .  acetaminophen (TYLENOL) tablet 650 mg, 650 mg, Oral, Q6H PRN, Allie Bossier, MD, 650 mg at 05/22/18 9449 .  albuterol (PROVENTIL) tablet 2 mg, 2 mg, Oral, TID PRN, Bodenheimer, Charles A, NP, 2 mg at 05/14/18 2215 .  [START ON 05/23/2018] amiodarone (PACERONE) tablet 200 mg, 200 mg, Oral, Daily, Arrien, Jimmy Picket, MD .  aspirin chewable tablet 81 mg, 81 mg, Oral, Daily, Allie Bossier, MD, 81 mg at 05/22/18 0836 .  Darbepoetin Alfa (ARANESP) injection 100 mcg, 100 mcg, Subcutaneous, Q Thu-1800, Arrien, Jimmy Picket, MD, 100 mcg at 05/21/18 1755 .  diphenoxylate-atropine (LOMOTIL) 2.5-0.025 MG per tablet 2 tablet, 2  tablet, Oral, QID, Denton Brick, Courage, MD, 2 tablet at 05/22/18 0835 .  fentaNYL (SUBLIMAZE) injection 25 mcg, 25 mcg, Intravenous, Q4H PRN, Allie Bossier, MD, 25 mcg at 05/08/18 0135 .  ferrous sulfate tablet 325 mg, 325 mg, Oral, BID WC, Emokpae, Courage, MD, 325 mg at 05/22/18 0835 .  [START ON 05/23/2018] furosemide (LASIX) tablet 80 mg, 80 mg, Oral, Daily, Arrien, Jimmy Picket, MD .  heparin injection 5,000 Units, 5,000 Units, Subcutaneous, Q8H, Roxan Hockey, MD, 5,000 Units at 05/22/18 [phone] .  insulin aspart (novoLOG) injection 0-5 Units, 0-5 Units, Subcutaneous, QHS, Allie Bossier, MD, 2 Units at 05/03/18 2201 .  insulin aspart (novoLOG) injection 0-9 Units, 0-9 Units, Subcutaneous, TID WC, Allie Bossier, MD, 2 Units at 05/22/18 [phone] .  loperamide (IMODIUM) capsule 2 mg, 2 mg, Oral, PRN, Patrecia Pour, MD .  metoprolol  tartrate (LOPRESSOR) tablet 12.5 mg, 12.5 mg, Oral, BID, Emokpae, Courage, MD, 12.5 mg at 05/22/18 0836 .  multivitamin (RENA-VIT) tablet 1 tablet, 1 tablet, Oral, QHS, Allie Bossier, MD, 1 tablet at 05/21/18 2147 .  nitroGLYCERIN (NITROSTAT) SL tablet 0.4 mg, 0.4 mg, Sublingual, Q5 min PRN, Allie Bossier, MD .  ondansetron [facility]) injection 4 mg, 4 mg, Intravenous, Q6H PRN, Patrecia Pour, MD, 4 mg at 05/18/18 0836 .  oxyCODONE (Oxy IR/ROXICODONE) immediate release tablet 5 mg, 5 mg, Oral, Q6H PRN, Allie Bossier, MD, 5 mg at 05/22/18 0842 .  pantoprazole (PROTONIX) EC tablet 40 mg, 40 mg, Oral, BID AC, Emokpae, Courage, MD, 40 mg at 05/22/18 0624 .  phenol (CHLORASEPTIC) mouth spray 1 spray, 1 spray, Mouth/Throat, PRN, Allie Bossier, MD, 1 spray at 05/06/18 [phone] .  promethazine (PHENERGAN) injection 25 mg, 25 mg, Intravenous, Q8H PRN, Eulas Post, Hao, PA, 25 mg at 05/17/18 1350 .  rosuvastatin (CRESTOR) tablet 20 mg, 20 mg, Oral, q1800, Allie Bossier, MD, 20 mg at 05/21/18 1755 .  sodium chloride flush (NS) 0.9 % injection 10-40 mL, 10-40 mL, Intracatheter, Q12H,  Allie Bossier, MD, 10 mL at 05/21/18 2149 .  sodium chloride flush (NS) 0.9 % injection 10-40 mL, 10-40 mL, Intracatheter, PRN, Allie Bossier, MD  Patients Current Diet:  Diet Order           Diet - low sodium heart healthy        DIET DYS 3 Room service appropriate? Yes; Fluid consistency: Thin  Diet effective now          Precautions / Restrictions Precautions Precautions: Fall Precaution Comments: telemetry Restrictions Weight Bearing Restrictions: Yes LLE Weight Bearing: Non weight bearing Other Position/Activity Restrictions: NWB LLE at residual limb   Has the patient had 2 or more falls or a fall with injury in the past year?No  Prior Activity Level Limited Community (1-2x/wk): approx 3-4x/week; no job and does not drive (never had interest in driving)  Development worker, international aid / [place] Devices/Equipment: Environmental consultant (specify type), Wheelchair, CBG Meter, Cane (specify quad or straight), Bedside commode/3-in-1, Shower chair with back, Grab bars in shower, Grab bars around toilet, Hand-held shower hose  Prior Device Use: Indicate devices/aids used by the patient prior to current illness, exacerbation or injury? used Rollator (has RW and cane as well)  Prior Functional Level Prior Function Level of Independence: Independent with assistive device(s) Comments: Pt reporting she was using a rollator PTA. Reports she performed ADLs.  Self Care: Did the patient need help bathing, dressing, using the toilet or eating?  Independent  Indoor Mobility: Did the patient need assistance with walking from room to room (with or without device)? Independent  Stairs: Did the patient need assistance with internal or external stairs (with or without device)? Independent  Functional Cognition: Did the patient need help planning regular tasks such as shopping or remembering to take medications? Independent  Current Functional Level Cognition  Overall Cognitive Status: No  family/caregiver present to determine baseline cognitive functioning Orientation Level: Oriented X4 General Comments: Pt fearful with mobility, but willing to try.    Extremity Assessment (includes Sensation/Coordination)  Upper Extremity Assessment: Overall WFL for tasks assessed  Lower Extremity Assessment: Defer to PT evaluation [name] Deficits / Details: strength was 4- to 4+ [name] Coordination: decreased fine motor, decreased gross motor LLE Deficits / Details: s/p left AKA on 04/26/18.  LLE: Unable to fully assess due to pain LLE Coordination:  decreased gross motor    ADLs  Overall ADL's : Needs assistance/impaired Eating/Feeding: Set up, Sitting Eating/Feeding Details (indicate cue type and reason): Set up with breakfast sitting at EOB at end of session Grooming: Set up, Supervision/safety, Sitting, Wash/dry face Grooming Details (indicate cue type and reason): washing face while sitting in recliner Upper Body Bathing: Minimal assistance, Sitting Lower Body Bathing: Maximal assistance, Sitting/lateral leans, Bed level Upper Body Dressing : Minimal assistance, Sitting Lower Body Dressing: Maximal assistance Lower Body Dressing Details (indicate cue type and reason): to don sock sitting EOB Toilet Transfer: Maximal assistance, +2 for physical assistance, Stand-pivot(Drop arm recliner) Toilet Transfer Details (indicate cue type and reason): Pt performing stand pivot transfer with Max A +2 to drop arm recliner. Pt requiring R knee blocked and use of bed pad to lift and manage hips Toileting- Clothing Manipulation and Hygiene: Maximal assistance, Bed level Toileting - Clothing Manipulation Details (indicate cue type and reason): Pt requiring Min A to manage LLE for rolling to be placed on bed pad. Pt requiring Max A to performing toilet hygiene to clean up after BM in bed before placing on bed pan. Functional mobility during ADLs: Maximal assistance, +2 for physical assistance(stand pivot  only to drop arm recliner) General ADL Comments: Max assist +2 for sit to stand at EOB with use of Stedy; pt appears fearful of falling--requests to sit back down quickly. Requested Microbiologist ordered.    Mobility  Overal bed mobility: Needs Assistance Bed Mobility: Rolling, Supine to Sit Rolling: Min assist Supine to sit: Min assist, +2 for physical assistance Sit to supine: Min guard, Min assist General bed mobility comments: min A to roll for ther ex, with heavy use of bed rails. Min A +2 to progress into sitting EOB and scoot hips.     Transfers  Overall transfer level: Needs assistance Equipment used: 1 person hand held assist Transfer via Lift Equipment: Stedy Transfers: Sit to/from Stand Sit to Stand: Max assist, +2 physical assistance, From elevated surface Stand pivot transfers: Mod assist  Lateral/Scoot Transfers: Mod assist General transfer comment: Utalized stedy for sit<>stand transfer. Pt very anxious about standing but agreeable to try. Cues provided for technique and hand placement. First attempt, pt unable to clear buttocks off EOB. Second attempt, utalized bed pad for lift assist and elevated bed. Pt able to rise into standing, however quickly returned to a seated position secondary to fear.    Ambulation / Gait / Stairs / Wheelchair Mobility  Ambulation/Gait General Gait Details: unable    Posture / Balance Dynamic Sitting Balance Sitting balance - Comments: Able to maintain sitting EOB with single LE support Balance Overall balance assessment: Needs assistance Sitting-balance support: Feet supported, No upper extremity supported Sitting balance-Leahy Scale: Fair Sitting balance - Comments: Able to maintain sitting EOB with single LE support Standing balance support: Bilateral upper extremity supported, During functional activity Standing balance-Leahy Scale: Zero Standing balance comment: max A to rise into standing with stedy    Special needs/care  consideration BiPAP/CPAP: No CPM: No Continuous Drip IV: No Dialysis: was on intermittent HD due to AKI; last HD date 05/09/18; TDC removed; no plan for further dialysis  Life Vest: no Oxygen: No Special Bed: No  Trach Size: No Wound Vac (area): No      Location: NA Skin: Has history of old R buttocks stage 3 wound; now documented in flowsheet at Stage 2; L AKA surgical wound  Bowel mgmt:Last BM 05/21/18; Continent  Bladder mgmt:continent Diabetic mgmt: Yes (per pt report, use of injections).      Previous Home Environment Home Care Services: No  Discharge Living Setting Plans for Discharge Living Setting: Patient's home, Lives with (comment)(lives with husband and father in law) Type of Home at Discharge: House Discharge Home Layout: One level Discharge Home Access: [place] entrance Discharge Bathroom Shower/Tub: Walk-in shower Discharge Bathroom Toilet: Handicapped height(via BSC frame) Discharge Bathroom Accessibility: Yes How Accessible: Accessible via wheelchair, Accessible via walker Does the patient have any problems obtaining your medications?: No  Social/Family/Support Systems Patient Roles: Spouse Contact Information: Husband [name]) Anticipated Caregiver: husband and father in law Anticipated Caregiver's Contact Information: Husband: cell [phone]; home: [phone] Ability/Limitations of Caregiver: both husband and father in law in good phsyical condiiton Caregiver Availability: 24/7 Discharge Plan Discussed with Primary Caregiver: Yes Is Caregiver In Agreement with Plan?: Yes Does Caregiver/Family have Issues with Lodging/Transportation while Pt is in Rehab?: No   Goals/Additional Needs Patient/Family Goal for Rehab: PT/OT: Mod I; SLP: NA Expected length of stay: 9-14 days Cultural Considerations: NA Dietary Needs: DYS 3, thin; Of note listed allergies: Lactose Intolerance Equipment Needs: TBD Special Service Needs:  NA Additional Information: NA Pt/Family Agrees to Admission and willing to participate: Yes Program Orientation Provided & Reviewed with Pt/Caregiver Including Roles  & Responsibilities: Yes(pt)  Barriers to Discharge: Weight bearing restrictions   Decrease burden of Care through IP rehab admission: NA   Possible need for SNF placement upon discharge:: Potentially   Patient Condition: This patient's medical and functional status has changed since the consult dated: 05/14/18 in which the Rehabilitation Physician determined and documented that the patient's condition is appropriate for intensive rehabilitative care in an inpatient rehabilitation facility. See "History of Present Illness" (above) for medical update. Functional changes are: Mod A for stand pivot transfers and Max A x2 for sit to stand transfers with [facility]. Patient's medical and functional status update has been discussed with the Rehabilitation physician and patient remains appropriate for inpatient rehabilitation. Will admit to inpatient rehab today.  Preadmission Screen Completed By:  Jhonnie Garner, 05/22/2018 12:19 PM ______________________________________________________________________   Discussed status with Dr. Posey Pronto on 05/22/18 at 12:20PM and received telephone approval for admission today.  Admission Coordinator:  Jhonnie Garner, time 12:20PM/Date 05/22/18.

## 2018-05-16 LAB — RENAL FUNCTION PANEL
Albumin: 1.9 g/dL — ABNORMAL LOW (ref 3.5–5.0)
Anion gap: 10 (ref 5–15)
BUN: 34 mg/dL — ABNORMAL HIGH (ref 6–20)
CO2: 28 mmol/L (ref 22–32)
Calcium: 8 mg/dL — ABNORMAL LOW (ref 8.9–10.3)
Chloride: 102 mmol/L (ref 98–111)
Creatinine, Ser: 5.42 mg/dL — ABNORMAL HIGH (ref 0.44–1.00)
GFR calc Af Amer: 9 mL/min — ABNORMAL LOW (ref >60)
GFR calc non Af Amer: 8 mL/min — ABNORMAL LOW (ref >60)
GLUCOSE: 127 mg/dL — AB (ref 70–99)
PHOSPHORUS: 5 mg/dL — AB (ref 2.5–4.6)
POTASSIUM: 4 mmol/L (ref 3.5–5.1)
Sodium: 140 mmol/L (ref 135–145)

## 2018-05-16 LAB — CBC
HCT: 28 % — ABNORMAL LOW (ref 36.0–46.0)
Hemoglobin: 8.3 g/dL — ABNORMAL LOW (ref 12.0–15.0)
MCH: 25.9 pg — AB (ref 26.0–34.0)
MCHC: 29.6 g/dL — AB (ref 30.0–36.0)
MCV: 87.2 fL (ref 78.0–100.0)
PLATELETS: 256 10*3/uL (ref 150–400)
RBC: 3.21 MIL/uL — ABNORMAL LOW (ref 3.87–5.11)
RDW: 21.5 % — ABNORMAL HIGH (ref 11.5–15.5)
WBC: 6.8 10*3/uL (ref 4.0–10.5)

## 2018-05-16 LAB — GLUCOSE, CAPILLARY
GLUCOSE-CAPILLARY: 120 mg/dL — AB (ref 70–99)
GLUCOSE-CAPILLARY: 127 mg/dL — AB (ref 70–99)
Glucose-Capillary: 152 mg/dL — ABNORMAL HIGH (ref 70–99)
Glucose-Capillary: 153 mg/dL — ABNORMAL HIGH (ref 70–99)

## 2018-05-16 LAB — MAGNESIUM: MAGNESIUM: 2 mg/dL (ref 1.7–2.4)

## 2018-05-16 MED ORDER — FUROSEMIDE 10 MG/ML IJ SOLN
80.0000 mg | Freq: Two times a day (BID) | INTRAMUSCULAR | Status: DC
  Administered 2018-05-16 – 2018-05-22 (×12): 80 mg via INTRAVENOUS
  Filled 2018-05-16 (×12): qty 8

## 2018-05-16 NOTE — Progress Notes (Signed)
[facility] NEPHROLOGY PROGRESS NOTE  Assessment/ Plan: Pt is a 52 y.o. yo female with history of diabetes, peripheral artery disease, A. fib, admitted with septic shock due to left lower extremity gangrenous with multiorgan dysfunction, non-STEMI, EF of 30 to 35%.  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  Developed and anuric AKI requiring intermittent dialysis.  Assessment/Plan:  # AKI from ATN in the setting of septic shock: Started dialysis on 6/12, tunneled catheter was placed with IR on 6/19.  Last dialysis was on 6/22.  -Patient was started on IV Lasix since 6/25 with good output, urine output of 2 L in last 24 hours.  Serum creatinine level trending up to 5.4.  The volume is still up therefore I am planning to continue IV Lasix 80 mg for  at least for 1 more day, change to q12hr.  She has no uremic symptoms.  No plan for dialysis today.   Daily assessment for the need of dialysis. -watch for renal recovery.  #Septic shock: Status post antibiotics, stress dose steroid, resolved now.  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)  # NSTEMI; may need cath, new RWMA on echo. Monitor kidney function.  # h/o Afib with RVR  Patient will likely go to inpatient rehab.  Subjective:  Seen and examined at bedside.  No chest pain, shortness of breath, nausea vomiting. Objective Vital signs in last 24 hours: Vitals:   05/15/18 1147 05/15/18 1149 05/15/18 2012 05/16/18 0707  BP: (!) 104/47 (!) 100/57 (!) 98/54 124/61  Pulse: 77 77 68 73  Resp: 18 18 18 18   Temp: 98 F (36.7 C)  98.1 F (36.7 C) 98.1 F (36.7 C)  TempSrc: Oral  Oral Oral  SpO2: 96% 96% 100% 94%  Weight:    82.7 kg (182 lb 5.1 oz)  Height:       Weight change:   Intake/Output Summary (Last 24 hours) at 05/16/2018 1023 Last data filed at 05/16/2018 0650 Gross per 24 hour  Intake 360 ml  Output 2000 ml  Net -1640 ml       Labs: Basic Metabolic Panel: Recent Labs  Lab  05/14/18 0628 05/15/18 0729 05/16/18 0500  NA 139 138 140  K 4.1 3.7 4.0  CL 103 101 102  CO2 26 27 28   GLUCOSE 125* 140* 127*  BUN 30* 33* 34*  CREATININE 4.90* 5.13* 5.42*  CALCIUM 8.2* 8.1* 8.0*  PHOS 4.4 4.7* 5.0*   Liver Function Tests: Recent Labs  Lab 05/14/18 0628 05/15/18 0729 05/16/18 0500  ALBUMIN 1.9* 2.0* 1.9*   No results for input(s): LIPASE, AMYLASE in the last 168 hours. No results for input(s): AMMONIA in the last 168 hours. CBC: Recent Labs  Lab 05/12/18 0531 05/13/18 0635 05/14/18 0628 05/15/18 0729 05/16/18 0500  WBC 9.3 8.9 8.4 8.2 6.8  HGB 8.5* 7.9* 8.7* 8.9* 8.3*  HCT 28.9* 27.1* 29.4* 29.6* 28.0*  MCV 88.4 87.4 87.0 86.5 87.2  PLT 242 219 232 265 256   Cardiac Enzymes: No results for input(s): CKTOTAL, CKMB, CKMBINDEX, TROPONINI in the last 168 hours. CBG: Recent Labs  Lab 05/15/18 0733 05/15/18 1145 05/15/18 1617 05/15/18 2228 05/16/18 0758  GLUCAP 134* 153* 159* 151* 120*    Iron Studies: No results for input(s): IRON, TIBC, TRANSFERRIN, FERRITIN in the last 72 hours. Studies/Results: No results found.  Medications: Infusions: . sodium chloride 10 mL/hr at 05/08/18 7412    Scheduled Medications: . amiodarone  400 mg  Oral Daily  . aspirin  81 mg Oral Daily  . chlorhexidine  15 mL Mouth Rinse BID  . Chlorhexidine Gluconate Cloth  6 each Topical Q0600  . darbepoetin (ARANESP) injection - DIALYSIS  100 mcg Intravenous Q Thu-HD  . diphenoxylate-atropine  2 tablet Oral QID  . ferrous sulfate  325 mg Oral BID WC  . furosemide  80 mg Intravenous Q8H  . heparin injection (subcutaneous)  5,000 Units Subcutaneous Q8H  . insulin aspart  0-5 Units Subcutaneous QHS  . insulin aspart  0-9 Units Subcutaneous TID WC  . mouth rinse  15 mL Mouth Rinse q12n4p  . metoprolol tartrate  12.5 mg Oral BID  . multivitamin  1 tablet Oral QHS  . pantoprazole  40 mg Oral BID AC  . rosuvastatin  20 mg Oral q1800  . sodium chloride flush  10-40  mL Intracatheter Q12H    have reviewed scheduled and prn medications.  Physical Exam: General: NAD, comfortable Heart: Regular rate rhythm S1-S2 normal Lungs: Bibasilar decreased breath sound Abdomen:soft, Non-tender, non-distended Extremities: right leg pitting edema ++, no significant change Dialysis Access:RIJ TDC  Shravan Salahuddin Prasad Mercadez Heitman 05/16/2018,10:23 AM  LOS: 21 days

## 2018-05-16 NOTE — Progress Notes (Signed)
PROGRESS NOTE  [name]  YNW:[id number] DOB: [birth year] DOA: 04/24/2018 PCP: [name], MD   Brief Narrative: [name] is a 52 y.o. female with a history of uncontrolled T2DM with neuropathy, PAD s/p left fem-pop bypass graft occlusion Jan 2019, right fibula osteomyelitis, PAF on xarelto, HLD, and depression with cognitive impairment who presented to the ED with septic shock due to left leg gangrene, NSTEMI and new LV dysfunction (EF 30-35%) with new wall motion abnormality. She was admitted  6/7. 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. Septic shock resolved but was complicated by oliguric renal failure requiring intermittent HD. Amiodarone has been started for rate control. Anticoagulation has been stopped due to suspected GI bleeding.  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)   PAD (peripheral artery disease) (HCC)   Pressure injury of skin   DM2 (diabetes mellitus, type 2) (HCC)   Acute kidney injury ([place])   Buttock wound, left, subsequent encounter   Paroxysmal atrial fibrillation with rapid ventricular response (HCC)   Lower limb ischemia   Microcytic anemia   Gangrene of lower extremity (HCC)   Acute renal failure (HCC)   Acute respiratory failure (HCC)   Sepsis (HCC)   Acute encephalopathy   Acute combined systolic and diastolic heart failure (HCC)   Non-ST elevation (NSTEMI) myocardial infarction ([place])  Left lower extremity gangrene, PAD and septic shock:  - 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.  - Off abx  Oliguric acute renal failure with ATN: Due to septic shock. Uncertain if this will turn into ESRD. - Intermittent HD started 6/12. Electrolytes stable, no uremia, but remains overloaded. Continue IV lasix (good UOP but Cr rising modestly)  NSTEMI: Felt to be due to demand in setting of septic shock. With known coronary calcifications, now regional wall motion  abnormality.  - Cardiology hoping to avoid contrast with cath as we monitor for renal recovery.  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  - Address volume status with diuretics and HD if needed. Remains peripherally overloaded but in no respiratory distress. Continue IV lasix. - On beta blocker - BP will not tolerate ACE/ARB, spironolactone.   PAF: With RVR now resolved.  - Continue metoprolol and amiodarone (new)  Acute on anemia of chronic disease: s/p 1u PRBCs 6/20 - Continue aranesp per nephrology - FOBT+, so heparin discontinued per cardiology. If develops gross bleeding, would need urgent GI consult, otherwise outpatient referral. - Continue po iron (ferritin 234, iron 29)  T2DM: HbA1c 8.4%.  - SSI AC/HS, at inpatient goal. This is well below her outpatient lantus 36u BID.   Developmental delay/cognitive deficits: Per history. Pt currently seems able to understand hospital course.   Diarrhea: Improving. Previous CDiff negative.  - Lomotil prn.   DVT prophylaxis: SCDs Code Status: Full Family Communication: None at bedside Disposition Plan: Pending declaration of ESRD vs. renal recovery. UOP picking up but not clearing. CIR consulted but will not take her until this is decided.   Consultants:   Nephrology  Cardiology  PCCM  Procedures:  Echo 04/28/2018 LV EF: 30% - 35% Study Conclusions - Left ventricle: The cavity size was normal. There was mild concentric hypertrophy. Systolic function was moderately to severely reduced. The estimated ejection fraction was in the range of 30% to 35%. Features are consistent with a pseudonormal left ventricular filling pattern, with concomitant  abnormal relaxation and increased filling pressure (grade 2 diastolic dysfunction). Doppler parameters are consistent with elevated ventricular end-diastolic filling pressure. - Mitral valve: Calcified annulus. Moderately thickened,  moderately calcified leaflets . There was mild regurgitation. - Left atrium: The atrium was mildly dilated. - Right ventricle: The cavity size was moderately dilated. Wall thickness was normal. Systolic function was moderately reduced. - Tricuspid valve: There was mild regurgitation. - Pulmonary arteries: Systolic pressure was mildly increased. PA peak pressure: 34 mm Hg (S). - Pericardium, extracardiac: A trivial pericardial effusion was identified. Features were not consistent with tamponade physiology.  Impressions: - When compared to the prior study from 04/17/2017 LVEF has decreased from 55-60% to 30-35% with new wall motion abnormalities - diffuse hypokinesis and akinesis of the basal and mid anteroseptal, anterior and apical septal and anterior walls. RVEF is moderately decreased.  Antimicrobials:  Vancomycin, zosyn 6/7 - 6/10  Ceftazidime 6/10 - 6/14  Subjective: Urinating frequently, no new complaints. Denies dyspnea, chest pain, N/V/D, hiccups, itching.  Objective: Vitals:   05/15/18 1149 05/15/18 2012 05/16/18 0707 05/16/18 1230  BP: (!) 100/57 (!) 98/54 124/61 (!) 91/39  Pulse: 77 68 73 63  Resp: 18 18 18 18   Temp:  98.1 F (36.7 C) 98.1 F (36.7 C) 97.9 F (36.6 C)  TempSrc:  Oral Oral Oral  SpO2: 96% 100% 94% 96%  Weight:   82.7 kg (182 lb 5.1 oz)   Height:        Intake/Output Summary (Last 24 hours) at 05/16/2018 1623 Last data filed at 05/16/2018 1500 Gross per 24 hour  Intake 600 ml  Output 1550 ml  Net -950 ml   Filed Weights   05/14/18 0446 05/15/18 0429 05/16/18 0707  Weight: 87.3 kg (192 lb 7.4 oz) 86.3 kg (190 lb 3.2 oz) 82.7 kg (182 lb 5.1 oz)   Gen: 52 y.o. female in no distress Pulm: Nonlabored breathing room air. Clear but diminished at bases. CV: Regular rate and rhythm. No murmur, rub, or gallop. No JVD, 2+ pitting LE dependent edema. GI: Abdomen soft, non-tender, non-distended, with normoactive bowel sounds.    Ext: Warm, Left BKA, stable, no infection Skin: MASD across buttocks and perineum.  Neuro: Alert and oriented. No focal neurological deficits. Psych: Judgement and insight appear fair. Mood euthymic & affect congruent. Behavior is appropriate.    CBC: Recent Labs  Lab 05/12/18 0531 05/13/18 0635 05/14/18 0628 05/15/18 0729 05/16/18 0500  WBC 9.3 8.9 8.4 8.2 6.8  HGB 8.5* 7.9* 8.7* 8.9* 8.3*  HCT 28.9* 27.1* 29.4* 29.6* 28.0*  MCV 88.4 87.4 87.0 86.5 87.2  PLT 242 219 232 265 643   Basic Metabolic Panel: Recent Labs  Lab 05/12/18 0531 05/13/18 0635 05/13/18 0636 05/14/18 0628 05/15/18 0729 05/16/18 0500  NA 139  --  137 139 138 140  K 4.4  --  4.4 4.1 3.7 4.0  CL 100  --  101 103 101 102  CO2 30  --  27 26 27 28   GLUCOSE 130*  --  118* 125* 140* 127*  BUN 21*  --  25* 30* 33* 34*  CREATININE 4.07*  --  4.54* 4.90* 5.13* 5.42*  CALCIUM 8.2*  --  8.0* 8.2* 8.1* 8.0*  MG 2.1 1.9  --  1.8 2.1 2.0  PHOS 3.8  --  4.1 4.4 4.7* 5.0*   GFR: Estimated Creatinine Clearance: 13.1 mL/min (A) (by C-G formula based on SCr of 5.42 mg/dL (H)). Liver Function Tests: Recent Labs  Lab 05/12/18  2263 05/13/18 0636 05/14/18 3354 05/15/18 0729 05/16/18 0500  ALBUMIN 1.9* 1.8* 1.9* 2.0* 1.9*   No results for input(s): LIPASE, AMYLASE in the last 168 hours. No results for input(s): AMMONIA in the last 168 hours. Coagulation Profile: No results for input(s): INR, PROTIME in the last 168 hours. Cardiac Enzymes: No results for input(s): CKTOTAL, CKMB, CKMBINDEX, TROPONINI in the last 168 hours. BNP (last 3 results) No results for input(s): PROBNP in the last 8760 hours. HbA1C: No results for input(s): HGBA1C in the last 72 hours. CBG: Recent Labs  Lab 05/15/18 1145 05/15/18 1617 05/15/18 2228 05/16/18 0758 05/16/18 1227  GLUCAP 153* 159* 151* 120* 127*   Lipid Profile: No results for input(s): CHOL, HDL, LDLCALC, TRIG, CHOLHDL, LDLDIRECT in the last 72 hours. Thyroid  Function Tests: No results for input(s): TSH, T4TOTAL, FREET4, T3FREE, THYROIDAB in the last 72 hours. Anemia Panel: No results for input(s): VITAMINB12, FOLATE, FERRITIN, TIBC, IRON, RETICCTPCT in the last 72 hours. Urine analysis:    Component Value Date/Time   COLORURINE YELLOW 04/25/2018 0407   APPEARANCEUR CLOUDY (A) 04/25/2018 0407   LABSPEC 1.012 04/25/2018 0407   PHURINE 5.0 04/25/2018 0407   GLUCOSEU NEGATIVE 04/25/2018 0407   HGBUR MODERATE (A) 04/25/2018 0407   BILIRUBINUR NEGATIVE 04/25/2018 0407   KETONESUR NEGATIVE 04/25/2018 0407   PROTEINUR 100 (A) 04/25/2018 0407   UROBILINOGEN 0.2 06/25/2011 1707   NITRITE NEGATIVE 04/25/2018 0407   LEUKOCYTESUR NEGATIVE 04/25/2018 0407   No results found for this or any previous visit (from the past 240 hour(s)).    Radiology Studies: No results found.  Scheduled Meds: . amiodarone  400 mg Oral Daily  . aspirin  81 mg Oral Daily  . chlorhexidine  15 mL Mouth Rinse BID  . Chlorhexidine Gluconate Cloth  6 each Topical Q0600  . darbepoetin (ARANESP) injection - DIALYSIS  100 mcg Intravenous Q Thu-HD  . diphenoxylate-atropine  2 tablet Oral QID  . ferrous sulfate  325 mg Oral BID WC  . furosemide  80 mg Intravenous Q12H  . heparin injection (subcutaneous)  5,000 Units Subcutaneous Q8H  . insulin aspart  0-5 Units Subcutaneous QHS  . insulin aspart  0-9 Units Subcutaneous TID WC  . mouth rinse  15 mL Mouth Rinse q12n4p  . metoprolol tartrate  12.5 mg Oral BID  . multivitamin  1 tablet Oral QHS  . pantoprazole  40 mg Oral BID AC  . rosuvastatin  20 mg Oral q1800  . sodium chloride flush  10-40 mL Intracatheter Q12H   Continuous Infusions: . sodium chloride 10 mL/hr at 05/08/18 0027     LOS: 21 days   Time spent: 15 minutes.  Patrecia Pour, MD [name] [email or web address] Password [facility] 05/16/2018, 4:23 PM

## 2018-05-17 LAB — RENAL FUNCTION PANEL
ALBUMIN: 2 g/dL — AB (ref 3.5–5.0)
ANION GAP: 13 (ref 5–15)
BUN: 38 mg/dL — ABNORMAL HIGH (ref 6–20)
CO2: 27 mmol/L (ref 22–32)
Calcium: 8.2 mg/dL — ABNORMAL LOW (ref 8.9–10.3)
Chloride: 100 mmol/L (ref 98–111)
Creatinine, Ser: 5.34 mg/dL — ABNORMAL HIGH (ref 0.44–1.00)
GFR calc Af Amer: 10 mL/min — ABNORMAL LOW (ref >60)
GFR, EST NON AFRICAN AMERICAN: 8 mL/min — AB (ref >60)
GLUCOSE: 115 mg/dL — AB (ref 70–99)
PHOSPHORUS: 4.7 mg/dL — AB (ref 2.5–4.6)
POTASSIUM: 3.5 mmol/L (ref 3.5–5.1)
SODIUM: 140 mmol/L (ref 135–145)

## 2018-05-17 LAB — CBC
HEMATOCRIT: 30.6 % — AB (ref 36.0–46.0)
HEMOGLOBIN: 9 g/dL — AB (ref 12.0–15.0)
MCH: 25.5 pg — ABNORMAL LOW (ref 26.0–34.0)
MCHC: 29.4 g/dL — ABNORMAL LOW (ref 30.0–36.0)
MCV: 86.7 fL (ref 78.0–100.0)
Platelets: 304 10*3/uL (ref 150–400)
RBC: 3.53 MIL/uL — AB (ref 3.87–5.11)
RDW: 21.5 % — ABNORMAL HIGH (ref 11.5–15.5)
WBC: 6.6 10*3/uL (ref 4.0–10.5)

## 2018-05-17 LAB — MAGNESIUM: MAGNESIUM: 1.9 mg/dL (ref 1.7–2.4)

## 2018-05-17 LAB — GLUCOSE, CAPILLARY
GLUCOSE-CAPILLARY: 116 mg/dL — AB (ref 70–99)
GLUCOSE-CAPILLARY: 153 mg/dL — AB (ref 70–99)
GLUCOSE-CAPILLARY: 121 mg/dL — AB (ref 70–99)
GLUCOSE-CAPILLARY: 97 mg/dL (ref 70–99)

## 2018-05-17 MED ORDER — POTASSIUM CHLORIDE CRYS ER 20 MEQ PO TBCR
40.0000 meq | EXTENDED_RELEASE_TABLET | Freq: Once | ORAL | Status: AC
  Administered 2018-05-17: 40 meq via ORAL
  Filled 2018-05-17: qty 2

## 2018-05-17 MED ORDER — ONDANSETRON HCL 4 MG/2ML IJ SOLN
4.0000 mg | Freq: Four times a day (QID) | INTRAMUSCULAR | Status: DC | PRN
  Administered 2018-05-17 – 2018-05-18 (×2): 4 mg via INTRAVENOUS
  Filled 2018-05-17 (×2): qty 2

## 2018-05-17 NOTE — Progress Notes (Signed)
Patient complained of nausea and vomited x1 earlier at 1350.  Still complaining of nausea and asking for some more nausea medication.  Dr. Bonner Puna paged. Will continue to monitor.

## 2018-05-17 NOTE — Progress Notes (Signed)
Patient head of bed elevated greater than 30 degrees for all meals and medications today.

## 2018-05-17 NOTE — Plan of Care (Signed)
  Problem: Pain Managment: Goal: General experience of comfort will improve Outcome: Progressing   Problem: Clinical Measurements: Goal: Ability to maintain clinical measurements within normal limits will improve Outcome: Progressing   

## 2018-05-17 NOTE — Progress Notes (Addendum)
[facility] NEPHROLOGY PROGRESS NOTE  Assessment/ Plan: Pt is a 52 y.o. yo female with history of diabetes, peripheral artery disease, A. fib, admitted with septic shock due to left lower extremity gangrenous with multiorgan dysfunction, non-STEMI, EF of 30 to 35%.  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  Developed and anuric AKI requiring intermittent dialysis.  Assessment/Plan:  # AKI from ATN in the setting of septic shock: Started dialysis on 6/12, tunneled catheter was placed with IR on 6/19.  Last dialysis was on 6/22.  -Patient was started on IV Lasix since 6/25 with good response.  The urine output is not recorded in last 24 hours.  Serum creatinine level is stable at 5.3.  Patient is still volume up, continue IV Lasix. Ordered a dose of KCL. Watch for renal recovery.  Discussed with the patient and with the primary team.  She has no uremic symptoms.  No plan for dialysis today.   Daily assessment for the need of dialysis.  #Septic shock: Status post antibiotics, stress dose steroid, resolved now.  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)  # NSTEMI; may need cath, new RWMA on echo. Monitor kidney function.  # h/o Afib with RVR  Patient will likely go to inpatient rehab.  Subjective:  No new event.  Denied headache, dizziness, nausea vomiting.  No chest pain or shortness of breath. Objective Vital signs in last 24 hours: Vitals:   05/16/18 2003 05/17/18 0412 05/17/18 0424 05/17/18 1137  BP: (!) 93/45  (!) 112/54 (!) 114/55  Pulse: 60  67 71  Resp: 18  18 18   Temp: 97.9 F (36.6 C)  97.9 F (36.6 C) 98.1 F (36.7 C)  TempSrc: Oral  Oral Oral  SpO2: 98%  94% 97%  Weight:  81.1 kg (178 lb 12.7 oz)    Height:       Weight change:   Intake/Output Summary (Last 24 hours) at 05/17/2018 1201 Last data filed at 05/17/2018 1102 Gross per 24 hour  Intake 853 ml  Output 850 ml  Net 3 ml       Labs: Basic Metabolic Panel: Recent  Labs  Lab 05/15/18 0729 05/16/18 0500 05/17/18 0712  NA 138 140 140  K 3.7 4.0 3.5  CL 101 102 100  CO2 27 28 27   GLUCOSE 140* 127* 115*  BUN 33* 34* 38*  CREATININE 5.13* 5.42* 5.34*  CALCIUM 8.1* 8.0* 8.2*  PHOS 4.7* 5.0* 4.7*   Liver Function Tests: Recent Labs  Lab 05/15/18 0729 05/16/18 0500 05/17/18 0712  ALBUMIN 2.0* 1.9* 2.0*   No results for input(s): LIPASE, AMYLASE in the last 168 hours. No results for input(s): AMMONIA in the last 168 hours. CBC: Recent Labs  Lab 05/13/18 0635 05/14/18 0628 05/15/18 0729 05/16/18 0500 05/17/18 0712  WBC 8.9 8.4 8.2 6.8 6.6  HGB 7.9* 8.7* 8.9* 8.3* 9.0*  HCT 27.1* 29.4* 29.6* 28.0* 30.6*  MCV 87.4 87.0 86.5 87.2 86.7  PLT 219 232 265 256 304   Cardiac Enzymes: No results for input(s): CKTOTAL, CKMB, CKMBINDEX, TROPONINI in the last 168 hours. CBG: Recent Labs  Lab 05/16/18 1227 05/16/18 1627 05/16/18 2154 05/17/18 0755 05/17/18 1135  GLUCAP 127* 152* 153* 116* 153*    Iron Studies: No results for input(s): IRON, TIBC, TRANSFERRIN, FERRITIN in the last 72 hours. Studies/Results: No results found.  Medications: Infusions: . sodium chloride 10 mL/hr at 05/08/18 1761    Scheduled Medications: . amiodarone  400 mg Oral Daily  . aspirin  81 mg Oral Daily  . chlorhexidine  15 mL Mouth Rinse BID  . Chlorhexidine Gluconate Cloth  6 each Topical Q0600  . darbepoetin (ARANESP) injection - DIALYSIS  100 mcg Intravenous Q Thu-HD  . diphenoxylate-atropine  2 tablet Oral QID  . ferrous sulfate  325 mg Oral BID WC  . furosemide  80 mg Intravenous Q12H  . heparin injection (subcutaneous)  5,000 Units Subcutaneous Q8H  . insulin aspart  0-5 Units Subcutaneous QHS  . insulin aspart  0-9 Units Subcutaneous TID WC  . mouth rinse  15 mL Mouth Rinse q12n4p  . metoprolol tartrate  12.5 mg Oral BID  . multivitamin  1 tablet Oral QHS  . pantoprazole  40 mg Oral BID AC  . potassium chloride  40 mEq Oral Once  .  rosuvastatin  20 mg Oral q1800  . sodium chloride flush  10-40 mL Intracatheter Q12H    have reviewed scheduled and prn medications.  Physical Exam: General: Not in distress, Heart: Regular rate rhythm S1-S2 normal, no rub Lungs: Bibasilar decreased breath sound Abdomen:soft, Non-tender, non-distended Extremities: right leg pitting edema ++, no significant change Dialysis Access:RIJ TDC  Dron Prasad Bhandari 05/17/2018,12:01 PM  LOS: 22 days

## 2018-05-17 NOTE — Progress Notes (Addendum)
PROGRESS NOTE  [name]  EUM:[id number] DOB: 11-Jun-1966 DOA: 04/24/2018 PCP: [name], MD   Brief Narrative: [name] is a 52 y.o. female with a history of uncontrolled T2DM with neuropathy, PAD s/p left fem-pop bypass graft occlusion Jan 2019, right fibula osteomyelitis, PAF on xarelto, HLD, and depression with cognitive impairment who presented to the ED with septic shock due to left leg gangrene, NSTEMI and new LV dysfunction (EF 30-35%) with new wall motion abnormality. She was admitted  6/7. 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. Septic shock resolved but was complicated by oliguric renal failure requiring intermittent HD. Amiodarone has been started for rate control. Anticoagulation has been stopped due to suspected GI bleeding. IV lasix has been given with improving urine output and elevated creatinine. Unsure if she will require ongoing dialysis at this time.  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)   PAD (peripheral artery disease) (HCC)   Pressure injury of skin   DM2 (diabetes mellitus, type 2) (HCC)   Acute kidney injury ([place])   Buttock wound, left, subsequent encounter   Paroxysmal atrial fibrillation with rapid ventricular response (HCC)   Lower limb ischemia   Microcytic anemia   Gangrene of lower extremity (HCC)   Acute renal failure (HCC)   Acute respiratory failure (HCC)   Sepsis (HCC)   Acute encephalopathy   Acute combined systolic and diastolic heart failure (HCC)   Non-ST elevation (NSTEMI) myocardial infarction ([place])  Left lower extremity gangrene, PAD and septic shock:  - 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.  - Off abx  Oliguric acute renal failure with ATN: Due to septic shock. Uncertain if this will turn into ESRD. - Intermittent HD started 6/12. Electrolytes stable, no uremia, but remains overloaded. Agree with continuing IV diuresis. No HD currently  planned per nephrology.  NSTEMI: Felt to be due to demand in setting of septic shock. With known coronary calcifications, now regional wall motion abnormality.  - Cardiology hoping to avoid contrast with cath as we monitor for renal recovery.  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  - Address volume status with diuretics and HD if needed. Remains peripherally overloaded but in no respiratory distress. Continue IV lasix. - On beta blocker - BP will not tolerate ACE/ARB, spironolactone.   PAF: With RVR now resolved.  - Continue metoprolol and amiodarone (new)  Acute on anemia of chronic disease: s/p 1u PRBCs 6/20 - Continue aranesp per nephrology - FOBT+, so heparin discontinued per cardiology. If develops gross bleeding, would need urgent GI consult, otherwise outpatient referral. - Continue po iron (ferritin 234, iron 29)  T2DM: HbA1c 8.4%.  - SSI AC/HS, at inpatient goal. This is well below her outpatient lantus 36u BID.   Developmental delay/cognitive deficits: Per history. Pt currently seems able to understand hospital course.   Diarrhea: Improving. Previous CDiff negative.  - Lomotil prn.   DVT prophylaxis: SCDs Code Status: Full Family Communication: None at bedside Disposition Plan: Pending declaration of ESRD vs. renal recovery. UOP picking up but not clearing. CIR consulted but will not take her until this is decided.   Consultants:   Nephrology  Cardiology  PCCM  Procedures:  Echo 04/28/2018 LV EF: 30% - 35% Study Conclusions - Left ventricle: The cavity size was normal. There was mild concentric hypertrophy. Systolic function was moderately to severely reduced.  The estimated ejection fraction was in the range of 30% to 35%. Features are consistent with a pseudonormal left ventricular filling pattern, with concomitant abnormal relaxation and increased filling pressure (grade 2 diastolic dysfunction). Doppler  parameters are consistent with elevated ventricular end-diastolic filling pressure. - Mitral valve: Calcified annulus. Moderately thickened, moderately calcified leaflets . There was mild regurgitation. - Left atrium: The atrium was mildly dilated. - Right ventricle: The cavity size was moderately dilated. Wall thickness was normal. Systolic function was moderately reduced. - Tricuspid valve: There was mild regurgitation. - Pulmonary arteries: Systolic pressure was mildly increased. PA peak pressure: 34 mm Hg (S). - Pericardium, extracardiac: A trivial pericardial effusion was identified. Features were not consistent with tamponade physiology.  Impressions: - When compared to the prior study from 04/17/2017 LVEF has decreased from 55-60% to 30-35% with new wall motion abnormalities - diffuse hypokinesis and akinesis of the basal and mid anteroseptal, anterior and apical septal and anterior walls. RVEF is moderately decreased.  Antimicrobials:  Vancomycin, zosyn 6/7 - 6/10  Ceftazidime 6/10 - 6/14  Subjective: No new complaints. Breathing fine.   Objective: Vitals:   05/16/18 2003 05/17/18 0412 05/17/18 0424 05/17/18 1137  BP: (!) 93/45  (!) 112/54 (!) 114/55  Pulse: 60  67 71  Resp: 18  18 18   Temp: 97.9 F (36.6 C)  97.9 F (36.6 C) 98.1 F (36.7 C)  TempSrc: Oral  Oral Oral  SpO2: 98%  94% 97%  Weight:  81.1 kg (178 lb 12.7 oz)    Height:        Intake/Output Summary (Last 24 hours) at 05/17/2018 1358 Last data filed at 05/17/2018 1102 Gross per 24 hour  Intake 853 ml  Output 850 ml  Net 3 ml   Filed Weights   05/15/18 0429 05/16/18 0707 05/17/18 0412  Weight: 86.3 kg (190 lb 3.2 oz) 82.7 kg (182 lb 5.1 oz) 81.1 kg (178 lb 12.7 oz)   Gen: 52 y.o. female in no distress HEENT: Anisocoria, poor dentition Pulm: Nonlabored breathing room air. Clear. Ext: +stable LE edema.    CBC: Recent Labs  Lab 05/13/18 0635 05/14/18 0628  05/15/18 0729 05/16/18 0500 05/17/18 0712  WBC 8.9 8.4 8.2 6.8 6.6  HGB 7.9* 8.7* 8.9* 8.3* 9.0*  HCT 27.1* 29.4* 29.6* 28.0* 30.6*  MCV 87.4 87.0 86.5 87.2 86.7  PLT 219 232 265 256 732   Basic Metabolic Panel: Recent Labs  Lab 05/13/18 0635 05/13/18 0636 05/14/18 0628 05/15/18 0729 05/16/18 0500 05/17/18 0712  NA  --  137 139 138 140 140  K  --  4.4 4.1 3.7 4.0 3.5  CL  --  101 103 101 102 100  CO2  --  27 26 27 28 27   GLUCOSE  --  118* 125* 140* 127* 115*  BUN  --  25* 30* 33* 34* 38*  CREATININE  --  4.54* 4.90* 5.13* 5.42* 5.34*  CALCIUM  --  8.0* 8.2* 8.1* 8.0* 8.2*  MG 1.9  --  1.8 2.1 2.0 1.9  PHOS  --  4.1 4.4 4.7* 5.0* 4.7*   GFR: Estimated Creatinine Clearance: 13.2 mL/min (A) (by C-G formula based on SCr of 5.34 mg/dL (H)). Liver Function Tests: Recent Labs  Lab 05/13/18 0636 05/14/18 0628 05/15/18 0729 05/16/18 0500 05/17/18 0712  ALBUMIN 1.8* 1.9* 2.0* 1.9* 2.0*   No results for input(s): LIPASE, AMYLASE in the last 168 hours. No results for input(s): AMMONIA in the last 168 hours. Coagulation Profile: No  results for input(s): INR, PROTIME in the last 168 hours. Cardiac Enzymes: No results for input(s): CKTOTAL, CKMB, CKMBINDEX, TROPONINI in the last 168 hours. BNP (last 3 results) No results for input(s): PROBNP in the last 8760 hours. HbA1C: No results for input(s): HGBA1C in the last 72 hours. CBG: Recent Labs  Lab 05/16/18 1227 05/16/18 1627 05/16/18 2154 05/17/18 0755 05/17/18 1135  GLUCAP 127* 152* 153* 116* 153*   Lipid Profile: No results for input(s): CHOL, HDL, LDLCALC, TRIG, CHOLHDL, LDLDIRECT in the last 72 hours. Thyroid Function Tests: No results for input(s): TSH, T4TOTAL, FREET4, T3FREE, THYROIDAB in the last 72 hours. Anemia Panel: No results for input(s): VITAMINB12, FOLATE, FERRITIN, TIBC, IRON, RETICCTPCT in the last 72 hours. Urine analysis:    Component Value Date/Time   COLORURINE YELLOW 04/25/2018 0407    APPEARANCEUR CLOUDY (A) 04/25/2018 0407   LABSPEC 1.012 04/25/2018 0407   PHURINE 5.0 04/25/2018 0407   GLUCOSEU NEGATIVE 04/25/2018 0407   HGBUR MODERATE (A) 04/25/2018 0407   BILIRUBINUR NEGATIVE 04/25/2018 0407   KETONESUR NEGATIVE 04/25/2018 0407   PROTEINUR 100 (A) 04/25/2018 0407   UROBILINOGEN 0.2 06/25/2011 1707   NITRITE NEGATIVE 04/25/2018 0407   LEUKOCYTESUR NEGATIVE 04/25/2018 0407   No results found for this or any previous visit (from the past 240 hour(s)).    Radiology Studies: No results found.  Scheduled Meds: . amiodarone  400 mg Oral Daily  . aspirin  81 mg Oral Daily  . chlorhexidine  15 mL Mouth Rinse BID  . Chlorhexidine Gluconate Cloth  6 each Topical Q0600  . darbepoetin (ARANESP) injection - DIALYSIS  100 mcg Intravenous Q Thu-HD  . diphenoxylate-atropine  2 tablet Oral QID  . ferrous sulfate  325 mg Oral BID WC  . furosemide  80 mg Intravenous Q12H  . heparin injection (subcutaneous)  5,000 Units Subcutaneous Q8H  . insulin aspart  0-5 Units Subcutaneous QHS  . insulin aspart  0-9 Units Subcutaneous TID WC  . mouth rinse  15 mL Mouth Rinse q12n4p  . metoprolol tartrate  12.5 mg Oral BID  . multivitamin  1 tablet Oral QHS  . pantoprazole  40 mg Oral BID AC  . potassium chloride  40 mEq Oral Once  . rosuvastatin  20 mg Oral q1800  . sodium chloride flush  10-40 mL Intracatheter Q12H   Continuous Infusions: . sodium chloride 10 mL/hr at 05/08/18 0027     LOS: 22 days   Time spent: 15 minutes.  Patrecia Pour, MD [name] [email or web address] Password [email or web address] 05/17/2018, 1:58 PM

## 2018-05-18 LAB — GLUCOSE, CAPILLARY
Glucose-Capillary: 90 mg/dL (ref 70–99)
Glucose-Capillary: 117 mg/dL — ABNORMAL HIGH (ref 70–99)
Glucose-Capillary: 127 mg/dL — ABNORMAL HIGH (ref 70–99)
Glucose-Capillary: 140 mg/dL — ABNORMAL HIGH (ref 70–99)

## 2018-05-18 LAB — RENAL FUNCTION PANEL
Albumin: 2 g/dL — ABNORMAL LOW (ref 3.5–5.0)
Anion gap: 9 (ref 5–15)
BUN: 38 mg/dL — AB (ref 6–20)
CHLORIDE: 101 mmol/L (ref 98–111)
CO2: 29 mmol/L (ref 22–32)
CREATININE: 5.12 mg/dL — AB (ref 0.44–1.00)
Calcium: 8.2 mg/dL — ABNORMAL LOW (ref 8.9–10.3)
GFR calc Af Amer: 10 mL/min — ABNORMAL LOW (ref >60)
GFR calc non Af Amer: 9 mL/min — ABNORMAL LOW (ref >60)
GLUCOSE: 100 mg/dL — AB (ref 70–99)
Phosphorus: 4.9 mg/dL — ABNORMAL HIGH (ref 2.5–4.6)
Potassium: 3.7 mmol/L (ref 3.5–5.1)
Sodium: 139 mmol/L (ref 135–145)

## 2018-05-18 LAB — MAGNESIUM: Magnesium: 1.8 mg/dL (ref 1.7–2.4)

## 2018-05-18 NOTE — Clinical Social Work Note (Signed)
CSW continues to follow for discharge needs.  Richelle Glick, CSW [phone]  

## 2018-05-18 NOTE — Progress Notes (Signed)
RN rounded on pt. Pt is asleep.

## 2018-05-18 NOTE — Plan of Care (Signed)
  Problem: Elimination: Goal: Will not experience complications related to bowel motility Outcome: Progressing   

## 2018-05-18 NOTE — Progress Notes (Signed)
PROGRESS NOTE  [name]  WNI:[id number] DOB: 1966-10-19 DOA: 04/24/2018 PCP: [name], MD   Brief Narrative: [name] is a 52 y.o. female with a history of uncontrolled T2DM with neuropathy, PAD s/p left fem-pop bypass graft occlusion Jan 2019, right fibula osteomyelitis, PAF on xarelto, HLD, and depression with cognitive impairment who presented to the ED with septic shock due to left leg gangrene, NSTEMI and new LV dysfunction (EF 30-35%) with new wall motion abnormality. She was admitted  6/7. 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. Septic shock resolved but was complicated by oliguric renal failure requiring intermittent HD. Amiodarone has been started for rate control. Anticoagulation has been stopped due to suspected GI bleeding. IV lasix has been given with improving urine output and elevated creatinine. Unsure if she will require ongoing dialysis at this time.  Assessment & Plan: Principal Problem:   Septic shock ([place]) Active Problems:   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)   PAD (peripheral artery disease) (HCC)   Pressure injury of skin   DM2 (diabetes mellitus, type 2) (HCC)   Acute kidney injury ([place])   Buttock wound, left, subsequent encounter   Paroxysmal atrial fibrillation with rapid ventricular response (HCC)   Lower limb ischemia   Microcytic anemia   Gangrene of lower extremity (HCC)   Acute renal failure (HCC)   Acute respiratory failure (HCC)   Sepsis (HCC)   Acute encephalopathy   Acute combined systolic and diastolic heart failure (HCC)   Non-ST elevation (NSTEMI) myocardial infarction ([place])  Left lower extremity gangrene, PAD and septic shock:  - 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.  - Off abx  Oliguric acute renal failure with ATN: Due to septic shock. Uncertain if this will turn into ESRD. - Intermittent HD started 6/12.  - Urine output picking up (3L yesterday) with at least stable creatinine. Defer diuretic decision to  nephrology, currently on lasix 80mg  IV q12h.  NSTEMI: Felt to be due to demand in setting of septic shock. With known coronary calcifications, now regional wall motion abnormality.  - Cardiology hoping to avoid contrast with cath as we monitor for renal recovery.  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  - Address volume status with diuretics and HD if needed. Remains peripherally overloaded but in no respiratory distress. Continue IV lasix. - On beta blocker - BP will not tolerate ACE/ARB, spironolactone.   PAF: With RVR now resolved.  - Continue metoprolol and amiodarone (new)  Acute on anemia of chronic disease: s/p 1u PRBCs 6/20 - Continue aranesp per nephrology - FOBT+, so heparin discontinued per cardiology. If develops gross bleeding, would need urgent GI consult, otherwise outpatient referral. - Continue po iron (ferritin 234, iron 29)  T2DM: HbA1c 8.4%.  - SSI AC/HS, at inpatient goal. This is well below her outpatient lantus 36u BID.   Developmental delay/cognitive deficits: Per history. Pt currently seems able to understand hospital course.   Diarrhea: Improving. Previous CDiff negative.  - Lomotil prn.   DVT prophylaxis: SCDs Code Status: Full Family Communication: Husband at bedside Disposition Plan: Pending declaration of ESRD vs. renal recovery. CIR consulted but will not take her until this is decided.   Consultants:   Nephrology  Cardiology  PCCM  Procedures:  Echo 04/28/2018 LV EF: 30% - 35% Study Conclusions - Left ventricle: The cavity size was normal. There was mild concentric hypertrophy. Systolic function was moderately to severely reduced.  The estimated ejection fraction was in the range of 30% to 35%. Features are consistent with a pseudonormal left ventricular filling pattern, with concomitant abnormal relaxation and increased filling pressure (grade 2 diastolic dysfunction). Doppler  parameters are consistent with elevated ventricular end-diastolic filling pressure. - Mitral valve: Calcified annulus. Moderately thickened, moderately calcified leaflets . There was mild regurgitation. - Left atrium: The atrium was mildly dilated. - Right ventricle: The cavity size was moderately dilated. Wall thickness was normal. Systolic function was moderately reduced. - Tricuspid valve: There was mild regurgitation. - Pulmonary arteries: Systolic pressure was mildly increased. PA peak pressure: 34 mm Hg (S). - Pericardium, extracardiac: A trivial pericardial effusion was identified. Features were not consistent with tamponade physiology.  Impressions: - When compared to the prior study from 04/17/2017 LVEF has decreased from 55-60% to 30-35% with new wall motion abnormalities - diffuse hypokinesis and akinesis of the basal and mid anteroseptal, anterior and apical septal and anterior walls. RVEF is moderately decreased.  Antimicrobials:  Vancomycin, zosyn 6/7 - 6/10  Ceftazidime 6/10 - 6/14  Subjective: Nauseated this morning, no more vomiting. Zofran helped when phenergan did not yesterday. No abdominal pain or other complaints. Making a lot more urine per pt.  Objective: Vitals:   05/18/18 0446 05/18/18 0835 05/18/18 1100 05/18/18 1300  BP: (!) 96/56 113/63 (!) 114/56 (!) 113/58  Pulse: 63 69 64 64  Resp: 16  16   Temp: 97.9 F (36.6 C)  98.2 F (36.8 C)   TempSrc: Oral  Oral   SpO2: 94%  92%   Weight:      Height:        Intake/Output Summary (Last 24 hours) at 05/18/2018 1511 Last data filed at 05/18/2018 0842 Gross per 24 hour  Intake 250 ml  Output 2725 ml  Net -2475 ml   Filed Weights   05/16/18 0707 05/17/18 0412 05/18/18 0136  Weight: 82.7 kg (182 lb 5.1 oz) 81.1 kg (178 lb 12.7 oz) 80 kg (176 lb 5.9 oz)   Gen: No distress HEENT: Anisocoria, poor dentition, stable Pulm: Clear and nonlabored GI: Soft, NT, ND, +BS Ext: left AKA,  stump site with staples looks great. +edema.  CBC: Recent Labs  Lab 05/13/18 0635 05/14/18 0628 05/15/18 0729 05/16/18 0500 05/17/18 0712  WBC 8.9 8.4 8.2 6.8 6.6  HGB 7.9* 8.7* 8.9* 8.3* 9.0*  HCT 27.1* 29.4* 29.6* 28.0* 30.6*  MCV 87.4 87.0 86.5 87.2 86.7  PLT 219 232 265 256 938   Basic Metabolic Panel: Recent Labs  Lab 05/14/18 0628 05/15/18 0729 05/16/18 0500 05/17/18 0712 05/18/18 0552  NA 139 138 140 140 139  K 4.1 3.7 4.0 3.5 3.7  CL 103 101 102 100 101  CO2 26 27 28 27 29   GLUCOSE 125* 140* 127* 115* 100*  BUN 30* 33* 34* 38* 38*  CREATININE 4.90* 5.13* 5.42* 5.34* 5.12*  CALCIUM 8.2* 8.1* 8.0* 8.2* 8.2*  MG 1.8 2.1 2.0 1.9 1.8  PHOS 4.4 4.7* 5.0* 4.7* 4.9*   GFR: Estimated Creatinine Clearance: 13.7 mL/min (A) (by C-G formula based on SCr of 5.12 mg/dL (H)). Liver Function Tests: Recent Labs  Lab 05/14/18 0628 05/15/18 0729 05/16/18 0500 05/17/18 0712 05/18/18 0552  ALBUMIN 1.9* 2.0* 1.9* 2.0* 2.0*   No results for input(s): LIPASE, AMYLASE in the last 168 hours. No results for input(s): AMMONIA in the last 168 hours. Coagulation Profile: No results for input(s): INR, PROTIME in the last 168 hours. Cardiac Enzymes: No results for input(s):  CKTOTAL, CKMB, CKMBINDEX, TROPONINI in the last 168 hours. BNP (last 3 results) No results for input(s): PROBNP in the last 8760 hours. HbA1C: No results for input(s): HGBA1C in the last 72 hours. CBG: Recent Labs  Lab 05/17/18 1135 05/17/18 1705 05/17/18 2103 05/18/18 0749 05/18/18 1125  GLUCAP 153* 121* 97 90 117*   Lipid Profile: No results for input(s): CHOL, HDL, LDLCALC, TRIG, CHOLHDL, LDLDIRECT in the last 72 hours. Thyroid Function Tests: No results for input(s): TSH, T4TOTAL, FREET4, T3FREE, THYROIDAB in the last 72 hours. Anemia Panel: No results for input(s): VITAMINB12, FOLATE, FERRITIN, TIBC, IRON, RETICCTPCT in the last 72 hours. Urine analysis:    Component Value Date/Time    COLORURINE YELLOW 04/25/2018 0407   APPEARANCEUR CLOUDY (A) 04/25/2018 0407   LABSPEC 1.012 04/25/2018 0407   PHURINE 5.0 04/25/2018 0407   GLUCOSEU NEGATIVE 04/25/2018 0407   HGBUR MODERATE (A) 04/25/2018 0407   BILIRUBINUR NEGATIVE 04/25/2018 0407   KETONESUR NEGATIVE 04/25/2018 0407   PROTEINUR 100 (A) 04/25/2018 0407   UROBILINOGEN 0.2 06/25/2011 1707   NITRITE NEGATIVE 04/25/2018 0407   LEUKOCYTESUR NEGATIVE 04/25/2018 0407   No results found for this or any previous visit (from the past 240 hour(s)).    Radiology Studies: No results found.  Scheduled Meds: . amiodarone  400 mg Oral Daily  . aspirin  81 mg Oral Daily  . chlorhexidine  15 mL Mouth Rinse BID  . Chlorhexidine Gluconate Cloth  6 each Topical Q0600  . darbepoetin (ARANESP) injection - DIALYSIS  100 mcg Intravenous Q Thu-HD  . diphenoxylate-atropine  2 tablet Oral QID  . ferrous sulfate  325 mg Oral BID WC  . furosemide  80 mg Intravenous Q12H  . heparin injection (subcutaneous)  5,000 Units Subcutaneous Q8H  . insulin aspart  0-5 Units Subcutaneous QHS  . insulin aspart  0-9 Units Subcutaneous TID WC  . mouth rinse  15 mL Mouth Rinse q12n4p  . metoprolol tartrate  12.5 mg Oral BID  . multivitamin  1 tablet Oral QHS  . pantoprazole  40 mg Oral BID AC  . rosuvastatin  20 mg Oral q1800  . sodium chloride flush  10-40 mL Intracatheter Q12H   Continuous Infusions: . sodium chloride 10 mL/hr at 05/08/18 0027     LOS: 23 days   Time spent: 15 minutes.  Patrecia Pour, MD [name] [email or web address] Password [email or web address] 05/18/2018, 3:11 PM

## 2018-05-18 NOTE — Progress Notes (Signed)
S:"my stomach is bothering me today" O:BP (!) 113/58 (BP Location: Right Arm)   Pulse 64   Temp 98.2 F (36.8 C) (Oral)   Resp 16   Ht 5\' 7"  (1.702 m)   Wt 80 kg (176 lb 5.9 oz)   SpO2 92%   BMI 27.62 kg/m   Intake/Output Summary (Last 24 hours) at 05/18/2018 1523 Last data filed at 05/18/2018 0842 Gross per 24 hour  Intake 250 ml  Output 2725 ml  Net -2475 ml   Intake/Output: I/O last 3 completed shifts: In: 53 [P.O.:600; I.V.:13] Out: 3075 [Urine:3075]  Intake/Output this shift:  Total I/O In: 10 [I.V.:10] Out: 500 [Urine:500] Weight change: -2.7 kg (-5 lb 15.2 oz) Gen: NAD CVS: no rub Resp: cta Abd: benign Ext: 1+ edema, s/p LBKA  Recent Labs  Lab 05/12/18 0531 05/13/18 0636 05/14/18 0628 05/15/18 0729 05/16/18 0500 05/17/18 0712 05/18/18 0552  NA 139 137 139 138 140 140 139  K 4.4 4.4 4.1 3.7 4.0 3.5 3.7  CL 100 101 103 101 102 100 101  CO2 30 27 26 27 28 27 29   GLUCOSE 130* 118* 125* 140* 127* 115* 100*  BUN 21* 25* 30* 33* 34* 38* 38*  CREATININE 4.07* 4.54* 4.90* 5.13* 5.42* 5.34* 5.12*  ALBUMIN 1.9* 1.8* 1.9* 2.0* 1.9* 2.0* 2.0*  CALCIUM 8.2* 8.0* 8.2* 8.1* 8.0* 8.2* 8.2*  PHOS 3.8 4.1 4.4 4.7* 5.0* 4.7* 4.9*   Liver Function Tests: Recent Labs  Lab 05/16/18 0500 05/17/18 0712 05/18/18 0552  ALBUMIN 1.9* 2.0* 2.0*   No results for input(s): LIPASE, AMYLASE in the last 168 hours. No results for input(s): AMMONIA in the last 168 hours. CBC: Recent Labs  Lab 05/13/18 0635 05/14/18 0628 05/15/18 0729 05/16/18 0500 05/17/18 0712  WBC 8.9 8.4 8.2 6.8 6.6  HGB 7.9* 8.7* 8.9* 8.3* 9.0*  HCT 27.1* 29.4* 29.6* 28.0* 30.6*  MCV 87.4 87.0 86.5 87.2 86.7  PLT 219 232 265 256 304   Cardiac Enzymes: No results for input(s): CKTOTAL, CKMB, CKMBINDEX, TROPONINI in the last 168 hours. CBG: Recent Labs  Lab 05/17/18 1135 05/17/18 1705 05/17/18 2103 05/18/18 0749 05/18/18 1125  GLUCAP 153* 121* 97 90 117*    Iron Studies: No results for  input(s): IRON, TIBC, TRANSFERRIN, FERRITIN in the last 72 hours. Studies/Results: No results found. Marland Kitchen amiodarone  400 mg Oral Daily  . aspirin  81 mg Oral Daily  . chlorhexidine  15 mL Mouth Rinse BID  . Chlorhexidine Gluconate Cloth  6 each Topical Q0600  . darbepoetin (ARANESP) injection - DIALYSIS  100 mcg Intravenous Q Thu-HD  . diphenoxylate-atropine  2 tablet Oral QID  . ferrous sulfate  325 mg Oral BID WC  . furosemide  80 mg Intravenous Q12H  . heparin injection (subcutaneous)  5,000 Units Subcutaneous Q8H  . insulin aspart  0-5 Units Subcutaneous QHS  . insulin aspart  0-9 Units Subcutaneous TID WC  . mouth rinse  15 mL Mouth Rinse q12n4p  . metoprolol tartrate  12.5 mg Oral BID  . multivitamin  1 tablet Oral QHS  . pantoprazole  40 mg Oral BID AC  . rosuvastatin  20 mg Oral q1800  . sodium chloride flush  10-40 mL Intracatheter Q12H    BMET    Component Value Date/Time   NA 139 05/18/2018 0552   K 3.7 05/18/2018 0552   CL 101 05/18/2018 0552   CO2 29 05/18/2018 0552   GLUCOSE 100 (H) 05/18/2018 0552   BUN  38 (H) 05/18/2018 0552   CREATININE 5.12 (H) 05/18/2018 0552   CREATININE 1.24 (H) 08/12/2017 1530   CALCIUM 8.2 (L) 05/18/2018 0552   GFRNONAA 9 (L) 05/18/2018 0552   GFRNONAA 50 (L) 08/12/2017 1530   GFRAA 10 (L) 05/18/2018 0552   GFRAA 57 (L) 08/12/2017 1530   CBC    Component Value Date/Time   WBC 6.6 05/17/2018 0712   RBC 3.53 (L) 05/17/2018 0712   HGB 9.0 (L) 05/17/2018 0712   HCT 30.6 (L) 05/17/2018 0712   PLT 304 05/17/2018 0712   MCV 86.7 05/17/2018 0712   MCH 25.5 (L) 05/17/2018 0712   MCHC 29.4 (L) 05/17/2018 0712   RDW 21.5 (H) 05/17/2018 0712   LYMPHSABS 2.1 05/03/2018 0427   MONOABS 0.9 05/03/2018 0427   EOSABS 0.6 05/03/2018 0427   BASOSABS 0.1 05/03/2018 0427     Assessment/Plan:  1. AKI in setting of septic shock consistent with ischemic ATN.  Started on HD 04/29/18 via tunneled HD cath and last HD on 05/09/18.  Cr slowly  improving.  No indication for dialysis at this time and will continue following.  Hopefully we can d/c tdc if creatinine continues to improve, unless cardiac cath is planned for this hospitalization.  Baseline Cr was 0.9-1 2. Sepsis- s/p abx and stress dose steroids, now resolved 3. PAD- s/p LAKA 04/26/18 4. NSTEMI- may need cath but on hold due to AKI 5. A fib with h/o RVR 6. Anemia of chronic disease- on aranesp, FOBT +, heparin discontinued. 7. DM- per primary 8. 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  Avoid ACE/ARB/Aldactone given AKI.  Donetta Potts, MD [employer] [phone]

## 2018-05-18 NOTE — Care Management Note (Signed)
Case Management Note  Patient Details  Name: [name] MRN: [id number] Date of Birth: [birth year]  Subjective/Objective:   Septic Shock         05/18/2018- CM following for progression of care; Disposition is CIR vs SNF, possibly need HD. Mindi Slicker RN,MHA,[name]    04/01/2018 - Action/Plan: PTA pt lived at home with spouse- active with [facility] for [facility] services- orders have been placed for RN/PT/OT- and DME- wheelchair- spoke with pt at bedside- confirmed agency with pt and that she wants to continue services with [facility]- pt also spoke about needing w/c- PT at the bedside also and agreeable- will place narrative note for DME w/c- pt goes to the [facility] wound center for wound care. Call made to [facility] with [facility] for resumption of [facility] services and DME need- wheelchair to be delivered to room once approved prior to discharge.  Pt will go home with prevena wound VAC- ; Kenn File RN CM  Expected Discharge Date:     Possibly 05/22/2018             Expected Discharge Plan:   CIR vs SNF  In-House Referral:  Clinical Social Work  Discharge planning Services  CM Consult  Status of Service:  In process, will continue to follow  Sherrilyn Rist [phone] 05/18/2018, 2:00 PM

## 2018-05-19 LAB — RENAL FUNCTION PANEL
ALBUMIN: 2.2 g/dL — AB (ref 3.5–5.0)
ANION GAP: 12 (ref 5–15)
BUN: 38 mg/dL — AB (ref 6–20)
CO2: 29 mmol/L (ref 22–32)
Calcium: 8.5 mg/dL — ABNORMAL LOW (ref 8.9–10.3)
Chloride: 97 mmol/L — ABNORMAL LOW (ref 98–111)
Creatinine, Ser: 4.71 mg/dL — ABNORMAL HIGH (ref 0.44–1.00)
GFR, EST AFRICAN AMERICAN: 11 mL/min — AB (ref >60)
GFR, EST NON AFRICAN AMERICAN: 10 mL/min — AB (ref >60)
Glucose, Bld: 117 mg/dL — ABNORMAL HIGH (ref 70–99)
PHOSPHORUS: 5.1 mg/dL — AB (ref 2.5–4.6)
Potassium: 3.6 mmol/L (ref 3.5–5.1)
Sodium: 138 mmol/L (ref 135–145)

## 2018-05-19 LAB — MAGNESIUM: Magnesium: 1.6 mg/dL — ABNORMAL LOW (ref 1.7–2.4)

## 2018-05-19 LAB — GLUCOSE, CAPILLARY
GLUCOSE-CAPILLARY: 114 mg/dL — AB (ref 70–99)
GLUCOSE-CAPILLARY: 128 mg/dL — AB (ref 70–99)
Glucose-Capillary: 207 mg/dL — ABNORMAL HIGH (ref 70–99)
Glucose-Capillary: 121 mg/dL — ABNORMAL HIGH (ref 70–99)

## 2018-05-19 MED ORDER — LOPERAMIDE HCL 2 MG PO CAPS: 2.0000 mg | ORAL_CAPSULE | ORAL | Status: DC | PRN

## 2018-05-19 MED ORDER — MAGNESIUM SULFATE IN D5W 1-5 GM/100ML-% IV SOLN
1.0000 g | Freq: Once | INTRAVENOUS | Status: AC
  Administered 2018-05-19: 1 g via INTRAVENOUS
  Filled 2018-05-19: qty 100

## 2018-05-19 NOTE — Progress Notes (Addendum)
PROGRESS NOTE  [name]  QIW:[id number] DOB: 03-04-1966 DOA: 04/24/2018 PCP: [name], MD   Brief Narrative: [name] is a 52 y.o. female with a history of uncontrolled T2DM with neuropathy, PAD s/p left fem-pop bypass graft occlusion Jan 2019, right fibula osteomyelitis, PAF on xarelto, HLD, and depression with cognitive impairment who presented to the ED with septic shock due to left leg gangrene, NSTEMI and new LV dysfunction (EF 30-35%) with new wall motion abnormality. She was admitted  6/7. 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. Septic shock resolved but was complicated by oliguric renal failure requiring intermittent HD. Amiodarone has been started for rate control. Anticoagulation has been stopped due to suspected GI bleeding. IV lasix has been given with improving urine output and elevated creatinine. Unsure if she will require ongoing dialysis at this time.  Assessment & Plan: Principal Problem:   Septic shock ([place]) Active Problems:   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)   PAD (peripheral artery disease) (HCC)   Pressure injury of skin   DM2 (diabetes mellitus, type 2) (HCC)   Acute kidney injury ([place])   Buttock wound, left, subsequent encounter   Paroxysmal atrial fibrillation with rapid ventricular response (HCC)   Lower limb ischemia   Microcytic anemia   Gangrene of lower extremity (HCC)   Acute renal failure (HCC)   Acute respiratory failure (HCC)   Sepsis (HCC)   Acute encephalopathy   Acute combined systolic and diastolic heart failure (HCC)   Non-ST elevation (NSTEMI) myocardial infarction ([place])  Left lower extremity gangrene, PAD and septic shock:  - 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.  - Off abx  Oliguric acute renal failure with ATN: Due to septic shock. Uncertain if this will turn into ESRD. - Intermittent HD started 6/12, last was 6/22. Having encouraging trend in creatinine with improving UOP.   - Defer diuretic decision to  nephrology, currently on lasix 80mg  IV q12h.  NSTEMI: Felt to be due to demand in setting of septic shock. With known coronary calcifications, now regional wall motion abnormality.  - Cardiology hoping to avoid contrast with cath as we monitor for renal recovery.  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  - Address volume status with diuretics and HD if needed. Remains peripherally overloaded but in no respiratory distress. Continue IV lasix. - On beta blocker - BP will not tolerate ACE/ARB, spironolactone.   PAF: With RVR now resolved.  - Continue metoprolol and amiodarone (new)  Acute on anemia of chronic disease: s/p 1u PRBCs 6/20 - Continue aranesp per nephrology - FOBT+, so heparin discontinued per cardiology. If develops gross bleeding, would need urgent GI consult, otherwise outpatient referral. - Continue po iron (ferritin 234, iron 29)  T2DM: HbA1c 8.4%.  - SSI AC/HS, at inpatient goal. This is well below her outpatient lantus 36u BID.   Developmental delay/cognitive deficits: Per history. Pt currently seems able to understand hospital course.   Hypomagnesemia: Suspect some increased GI losses.  - Give 1g now, recheck in AM.  Diarrhea: Improving. Previous CDiff negative. Has this problem intermittently, chronically - Lomotil home med.  - Imodium prn - Consider testing if abd pain, fever, increased typical stools, etc.   DVT prophylaxis: SCDs Code Status: Full Family Communication: None at bedside Disposition Plan: Pending declaration of ESRD vs. renal recovery. CIR consulted but will not take her until this is decided. Having a positive trend, but continuing  dialysis cath per nephrology for now.  Consultants:   Nephrology  Cardiology  PCCM  Procedures:  Echo 04/28/2018 LV EF: 30% - 35% Study Conclusions - Left ventricle: The cavity size was normal. There was mild concentric hypertrophy. Systolic function was moderately  to severely reduced. The estimated ejection fraction was in the range of 30% to 35%. Features are consistent with a pseudonormal left ventricular filling pattern, with concomitant abnormal relaxation and increased filling pressure (grade 2 diastolic dysfunction). Doppler parameters are consistent with elevated ventricular end-diastolic filling pressure. - Mitral valve: Calcified annulus. Moderately thickened, moderately calcified leaflets . There was mild regurgitation. - Left atrium: The atrium was mildly dilated. - Right ventricle: The cavity size was moderately dilated. Wall thickness was normal. Systolic function was moderately reduced. - Tricuspid valve: There was mild regurgitation. - Pulmonary arteries: Systolic pressure was mildly increased. PA peak pressure: 34 mm Hg (S). - Pericardium, extracardiac: A trivial pericardial effusion was identified. Features were not consistent with tamponade physiology.  Impressions: - When compared to the prior study from 04/17/2017 LVEF has decreased from 55-60% to 30-35% with new wall motion abnormalities - diffuse hypokinesis and akinesis of the basal and mid anteroseptal, anterior and apical septal and anterior walls. RVEF is moderately decreased.  Antimicrobials:  Vancomycin, zosyn 6/7 - 6/10  Ceftazidime 6/10 - 6/14  Subjective: Nauseated this morning, no more vomiting. Zofran helped when phenergan did not yesterday. No abdominal pain or other complaints. Making a lot more urine per pt.  Objective: Vitals:   05/18/18 2147 05/19/18 0455 05/19/18 0544 05/19/18 0825  BP: (!) 106/45 131/66  (!) 124/57  Pulse: 68 62  66  Resp:  18  17  Temp:  98.2 F (36.8 C)  97.9 F (36.6 C)  TempSrc:  Oral    SpO2:  93%  95%  Weight:  81.1 kg (178 lb 12.7 oz) 78.7 kg (173 lb 8 oz)   Height:        Intake/Output Summary (Last 24 hours) at 05/19/2018 1123 Last data filed at 05/19/2018 1037 Gross per 24 hour    Intake 840 ml  Output 1625 ml  Net -785 ml   Filed Weights   05/18/18 0136 05/19/18 0455 05/19/18 0544  Weight: 80 kg (176 lb 5.9 oz) 81.1 kg (178 lb 12.7 oz) 78.7 kg (173 lb 8 oz)   Gen: No distress HEENT: Anisocoria, poor dentition, stable Pulm: Clear and nonlabored GI: Soft, NT, ND, +BS Ext: left AKA, stump site with staples looks great. +edema.  CBC: Recent Labs  Lab 05/13/18 0635 05/14/18 0628 05/15/18 0729 05/16/18 0500 05/17/18 0712  WBC 8.9 8.4 8.2 6.8 6.6  HGB 7.9* 8.7* 8.9* 8.3* 9.0*  HCT 27.1* 29.4* 29.6* 28.0* 30.6*  MCV 87.4 87.0 86.5 87.2 86.7  PLT 219 232 265 256 856   Basic Metabolic Panel: Recent Labs  Lab 05/15/18 0729 05/16/18 0500 05/17/18 0712 05/18/18 0552 05/19/18 0519  NA 138 140 140 139 138  K 3.7 4.0 3.5 3.7 3.6  CL 101 102 100 101 97*  CO2 27 28 27 29 29   GLUCOSE 140* 127* 115* 100* 117*  BUN 33* 34* 38* 38* 38*  CREATININE 5.13* 5.42* 5.34* 5.12* 4.71*  CALCIUM 8.1* 8.0* 8.2* 8.2* 8.5*  MG 2.1 2.0 1.9 1.8 1.6*  PHOS 4.7* 5.0* 4.7* 4.9* 5.1*   GFR: Estimated Creatinine Clearance: 14.7 mL/min (A) (by C-G formula based on SCr of 4.71 mg/dL (H)). Liver Function Tests: Recent Labs  Lab 05/15/18 [phone]  05/16/18 0500 05/17/18 0712 05/18/18 0552 05/19/18 0519  ALBUMIN 2.0* 1.9* 2.0* 2.0* 2.2*   No results for input(s): LIPASE, AMYLASE in the last 168 hours. No results for input(s): AMMONIA in the last 168 hours. Coagulation Profile: No results for input(s): INR, PROTIME in the last 168 hours. Cardiac Enzymes: No results for input(s): CKTOTAL, CKMB, CKMBINDEX, TROPONINI in the last 168 hours. BNP (last 3 results) No results for input(s): PROBNP in the last 8760 hours. HbA1C: No results for input(s): HGBA1C in the last 72 hours. CBG: Recent Labs  Lab 05/18/18 0749 05/18/18 1125 05/18/18 1557 05/18/18 2120 05/19/18 0726  GLUCAP 90 117* 127* 140* 114*   Lipid Profile: No results for input(s): CHOL, HDL, LDLCALC, TRIG,  CHOLHDL, LDLDIRECT in the last 72 hours. Thyroid Function Tests: No results for input(s): TSH, T4TOTAL, FREET4, T3FREE, THYROIDAB in the last 72 hours. Anemia Panel: No results for input(s): VITAMINB12, FOLATE, FERRITIN, TIBC, IRON, RETICCTPCT in the last 72 hours. Urine analysis:    Component Value Date/Time   COLORURINE YELLOW 04/25/2018 0407   APPEARANCEUR CLOUDY (A) 04/25/2018 0407   LABSPEC 1.012 04/25/2018 0407   PHURINE 5.0 04/25/2018 0407   GLUCOSEU NEGATIVE 04/25/2018 0407   HGBUR MODERATE (A) 04/25/2018 0407   BILIRUBINUR NEGATIVE 04/25/2018 0407   KETONESUR NEGATIVE 04/25/2018 0407   PROTEINUR 100 (A) 04/25/2018 0407   UROBILINOGEN 0.2 06/25/2011 1707   NITRITE NEGATIVE 04/25/2018 0407   LEUKOCYTESUR NEGATIVE 04/25/2018 0407   No results found for this or any previous visit (from the past 240 hour(s)).    Radiology Studies: No results found.  Scheduled Meds: . amiodarone  400 mg Oral Daily  . aspirin  81 mg Oral Daily  . chlorhexidine  15 mL Mouth Rinse BID  . Chlorhexidine Gluconate Cloth  6 each Topical Q0600  . darbepoetin (ARANESP) injection - DIALYSIS  100 mcg Intravenous Q Thu-HD  . diphenoxylate-atropine  2 tablet Oral QID  . ferrous sulfate  325 mg Oral BID WC  . furosemide  80 mg Intravenous Q12H  . heparin injection (subcutaneous)  5,000 Units Subcutaneous Q8H  . insulin aspart  0-5 Units Subcutaneous QHS  . insulin aspart  0-9 Units Subcutaneous TID WC  . mouth rinse  15 mL Mouth Rinse q12n4p  . metoprolol tartrate  12.5 mg Oral BID  . multivitamin  1 tablet Oral QHS  . pantoprazole  40 mg Oral BID AC  . rosuvastatin  20 mg Oral q1800  . sodium chloride flush  10-40 mL Intracatheter Q12H   Continuous Infusions: . sodium chloride 10 mL/hr at 05/08/18 0027     LOS: 24 days   Time spent: 25 minutes.  Patrecia Pour, MD [name] [email or web address] Password [facility] 05/19/2018, 11:23 AM

## 2018-05-19 NOTE — Progress Notes (Signed)
Nutrition Follow-up  DOCUMENTATION CODES:   Not applicable  INTERVENTION:   -Continue renal MVI daily -Continue snacks TID  NUTRITION DIAGNOSIS:   Increased nutrient needs related to wound healing as evidenced by estimated needs.  Ongoing  GOAL:   Patient will meet greater than or equal to 90% of their needs  Progressing  MONITOR:   PO intake, Supplement acceptance, Labs, Weight trends, Skin, I & O's  REASON FOR ASSESSMENT:   Consult Enteral/tube feeding initiation and management  ASSESSMENT:   52 y/o female with CAD and peripheral arterial disease was admitted with sepsis from an ischemic lower extremity eventually requiring amputation (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).  She also had an NSTEMI with new cardiomyopathy.  6/9- s/p lt AKA 6/13- rectal tube in place 6/17- pulled out HD cath 6/18- s/p rt IJ HD cath placement 6/21- pulled IJ cath out  Reviewed I/O's: -635 ml x 24 hours and -7.1 L since 05/05/18  Pt sitting up in bed, on phone and consuming breakfast at time of visit. Pt consumed approximately 50% of meal (bluebrry muffin left untouched). Meal completion has improved since last visit; noted 50-100%. Pt is also consuming in between meal nourishments.   Las HD was 05/09/18. Per MD notes, pt with no indications for HD today. Plan to continue to monitor renal status to determine most appropriate discharge disposition (CIR vs SNF).   Labs reviewed: CBGS: 114-140 (inpatient orders for glycemic control are 0-5 units insulin aspart q HS, o-9 units insulin aspart TID with meals  Diet Order:   Diet Order           DIET DYS 3 Room service appropriate? Yes; Fluid consistency: Thin  Diet effective now          EDUCATION NEEDS:   Not appropriate for education at this time  Skin:  Skin Assessment: Skin Integrity Issues: Skin Integrity Issues:: Stage III, Incisions Stage III: buttocks Incisions: left leg s/p BKA  Last BM:  05/17/18  Height:   Ht Readings  from Last 1 Encounters:  05/02/18 5\' 7"  (1.702 m)    Weight:   Wt Readings from Last 1 Encounters:  05/19/18 173 lb 8 oz (78.7 kg)    Ideal Body Weight:  57.1 kg(adjusted L BKA)  BMI:  Body mass index is 27.17 kg/m.  Estimated Nutritional Needs:   Kcal:  1700-2000kcal/day   Protein:  80-95g/day   Fluid:  >1.5L/day     Anysia Choi A. Jimmye Norman, RD, LDN, CDE Pager: [phone] After hours Pager: [phone]

## 2018-05-19 NOTE — Progress Notes (Signed)
OT Treatment Note  Pt able to perform sit to stand x1 today with max assist +2 and use of Stedy; pt appears fearful of falling and does not tolerate standing for long. Pt performing grooming and self feeding sitting EOB with set up. Requested geomat for chair to relieve pressure on buttocks--unit secretary ordered. D/c plan remains appropriate. Will continue to follow acutely.    05/19/18 1153  OT Visit Information  Last OT Received On 05/19/18  Assistance Needed +2  PT/OT/SLP Co-Evaluation/Treatment Yes  Reason for Co-Treatment Complexity of the patient's impairments (multi-system involvement);For patient/therapist safety  OT goals addressed during session Strengthening/ROM  History of Present Illness 52 year old female with PMH of DM, PAD, A.Fib on Xarelto, Chronic Wounds of Left Foot with H/O of ABF and Fem-Pop Bypass. Recent Admission 5/10-5/14 for redo of Left femoral to PTA bypass with cryopreserved vein. Post-Op patient lost pulse to left foot and was taken back to OR for thrombectomy. On 6/3 patient went to outpatient vascular appointment where she was found to by hypotensive and febrile with a occlusion noted to bypass graft. 6/7 patient presented to ED with AMS with pain to left lower leg and emesis and diarrhea. 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  Course has been complicated by AKI and NSTEMI.  Now requiring intermittent HD. ETT 6/11-6/14.   Precautions  Precautions Fall  Precaution Comments telemetry  Pain Assessment  Pain Assessment Faces  Faces Pain Scale 4  Pain Location sacral wound  Pain Descriptors / Indicators Grimacing;Guarding  Pain Intervention(s) Monitored during session;Limited activity within patient's tolerance;Repositioned  Cognition  Arousal/Alertness Awake/alert  Behavior During Therapy WFL for tasks assessed/performed  Overall Cognitive Status No family/caregiver present to determine baseline cognitive functioning  General Comments Pt fearful with  mobility, but willing to try.  Upper Extremity Assessment  Upper Extremity Assessment Overall WFL for tasks assessed  Lower Extremity Assessment  Lower Extremity Assessment Defer to PT evaluation  ADL  Overall ADL's  Needs assistance/impaired  Eating/Feeding Set up;Sitting  Eating/Feeding Details (indicate cue type and reason) Set up with breakfast sitting at EOB at end of session  Grooming Set up;Supervision/safety;Sitting;Wash/dry face  Lower Body Dressing Maximal assistance  Lower Body Dressing Details (indicate cue type and reason) to don sock sitting EOB  General ADL Comments Max assist +2 for sit to stand at EOB with use of Stedy; pt appears fearful of falling--requests to sit back down quickly. Requested Microbiologist ordered.  Bed Mobility  Overal bed mobility Needs Assistance  Bed Mobility Rolling;Supine to Sit  Rolling Min assist  Supine to sit Min assist;+2 for physical assistance  General bed mobility comments min A to roll for ther ex, with heavy use of bed rails. Min A +2 to progress into sitting EOB and scoot hips.   Balance  Overall balance assessment Needs assistance  Sitting-balance support Feet supported;No upper extremity supported  Sitting balance-Leahy Scale Fair  Sitting balance - Comments Able to maintain sitting EOB with single LE support  Standing balance support Bilateral upper extremity supported;During functional activity  Standing balance-Leahy Scale Zero  Standing balance comment max A to rise into standing with stedy  Restrictions  Weight Bearing Restrictions Yes  LLE Weight Bearing NWB  Other Position/Activity Restrictions NWB LLE at residual limb  Transfers  Overall transfer level Needs assistance  Transfer via Lift Equipment [employer] Sit to/from Stand  Sit to Stand Max assist;+2 physical assistance;From elevated surface  General transfer comment Utalized stedy for sit<>stand  transfer. Pt very anxious about standing but agreeable  to try. Cues provided for technique and hand placement. First attempt, pt unable to clear buttocks off EOB. Second attempt, utalized bed pad for lift assist and elevated bed. Pt able to rise into standing, however quickly returned to a seated position secondary to fear.  OT - End of Session  Equipment Utilized During Treatment Other (comment) Charlaine Dalton)  Activity Tolerance Patient tolerated treatment well  Patient left with call bell/phone within reach;Other (comment) (sitting EOB)  Nurse Communication Other (comment) (RN tech--left pt sitting EOB)  OT Assessment/Plan  OT Plan Discharge plan remains appropriate  OT Visit Diagnosis Unsteadiness on feet (R26.81);Other abnormalities of gait and mobility (R26.89);Muscle weakness (generalized) (M62.81);Pain;Other symptoms and signs involving cognitive function  Pain - Right/Left Left  Pain - part of body Leg  OT Frequency (ACUTE ONLY) Min 2X/week  Follow Up Recommendations SNF  OT Equipment Other (comment) (TBD at next venue)  AM-PAC OT "6 Clicks" Daily Activity Outcome Measure  Help from another person eating meals? 3  Help from another person taking care of personal grooming? 3  Help from another person toileting, which includes using toliet, bedpan, or urinal? 2  Help from another person bathing (including washing, rinsing, drying)? 2  Help from another person to put on and taking off regular upper body clothing? 3  Help from another person to put on and taking off regular lower body clothing? 2  6 Click Score 15  ADL G Code Conversion CK  OT Goal Progression  Progress towards OT goals Progressing toward goals  Acute Rehab OT Goals  Patient Stated Goal to get stronger  OT Goal Formulation With patient  OT Time Calculation  OT Start Time (ACUTE ONLY) 1049  OT Stop Time (ACUTE ONLY) 1114  OT Time Calculation (min) 25 min  OT General Charges  $OT Visit 1 Visit  OT Treatments  $Therapeutic Activity 8-22 mins   Jode Lippe A. Ulice Brilliant, M.S.,  OTR/L Acute Rehab Department: [phone]

## 2018-05-19 NOTE — Progress Notes (Signed)
Physical Therapy Treatment Patient Details Name: [name] MRN: [id number] DOB: 1966-11-14 Today's Date: 05/19/2018    History of Present Illness 52 year old female with PMH of DM, PAD, A.Fib on Xarelto, Chronic Wounds of Left Foot with H/O of ABF and Fem-Pop Bypass. Recent Admission 5/10-5/14 for redo of Left femoral to PTA bypass with cryopreserved vein. Post-Op patient lost pulse to left foot and was taken back to OR for thrombectomy. On 6/3 patient went to outpatient vascular appointment where she was found to by hypotensive and febrile with a occlusion noted to bypass graft. 6/7 patient presented to ED with AMS with pain to left lower leg and emesis and diarrhea. 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  Course has been complicated by AKI and NSTEMI.  Now requiring intermittent HD. ETT 6/11-6/14.     PT Comments    Pt able to stand briefly today with use of stedy. Feel this is the ideal option for pt transfers at this time, as it avoids sheering at sacral wound. Pt with c/o dizziness and nausea at beginning of treatment, that subsided as pt remained upright. Patient would benefit from continued skilled PT to increase activity tolerance and functional independence. Will continue to follow acutely.    Follow Up Recommendations  SNF     Equipment Recommendations  None recommended by PT    Recommendations for Other Services       Precautions / Restrictions Precautions Precautions: Fall Precaution Comments: telemetry Restrictions Weight Bearing Restrictions: Yes LLE Weight Bearing: Non weight bearing(AKA) Other Position/Activity Restrictions: NWB LLE at residual limb    Mobility  Bed Mobility Overal bed mobility: Needs Assistance Bed Mobility: Rolling;Supine to Sit Rolling: Min assist   Supine to sit: Min assist;+2 for physical assistance     General bed mobility comments: min A to roll for ther ex, with heavy use of bed rails. Min A +2 to progress into sitting EOB and  scoot hips.   Transfers Overall transfer level: Needs assistance   Transfers: Sit to/from Stand Sit to Stand: Max assist;+2 physical assistance;From elevated surface         General transfer comment: Utalized stedy for sit<>stand transfer. Pt very anxious about standing but agreeable to try. Cues provided for technique and hand placement. First attempt, pt unable to clear buttocks off EOB. Second attempt, utalized bed pad for lift assist and elevated bed. Pt able to rise into standing, however quickly returned to a seated position secondary to fear.  Ambulation/Gait                 Stairs             Wheelchair Mobility    Modified Rankin (Stroke Patients Only)       Balance Overall balance assessment: Needs assistance Sitting-balance support: Feet supported;No upper extremity supported Sitting balance-Leahy Scale: Fair Sitting balance - Comments: Able to maintain sitting EOB with single LE support   Standing balance support: Bilateral upper extremity supported;During functional activity Standing balance-Leahy Scale: Zero Standing balance comment: max A to rise into standing with stedy                            Cognition Arousal/Alertness: Awake/alert Behavior During Therapy: WFL for tasks assessed/performed Overall Cognitive Status: No family/caregiver present to determine baseline cognitive functioning  General Comments: Pt fearful with mobility, but willing to try.      Exercises Amputee Exercises Hip Extension: AAROM;Left;Sidelying(stretched hip flexors for 15 seconds/ 3x)    General Comments        Pertinent Vitals/Pain Pain Assessment: Faces Faces Pain Scale: Hurts little more Pain Location: sacral wound Pain Descriptors / Indicators: Grimacing;Guarding Pain Intervention(s): Monitored during session;Limited activity within patient's tolerance;Repositioned    Home Living                       Prior Function            PT Goals (current goals can now be found in the care plan section) Acute Rehab PT Goals Patient Stated Goal: to get stronger PT Goal Formulation: With patient Time For Goal Achievement: 05/22/18 Potential to Achieve Goals: Good Progress towards PT goals: Progressing toward goals    Frequency    Min 2X/week      PT Plan Current plan remains appropriate    Co-evaluation PT/OT/SLP Co-Evaluation/Treatment: Yes Reason for Co-Treatment: Complexity of the patient's impairments (multi-system involvement);For patient/therapist safety;To address functional/ADL transfers PT goals addressed during session: Mobility/safety with mobility;Strengthening/ROM        AM-PAC PT "6 Clicks" Daily Activity  Outcome Measure  Difficulty turning over in bed (including adjusting bedclothes, sheets and blankets)?: A Little Difficulty moving from lying on back to sitting on the side of the bed? : Unable Difficulty sitting down on and standing up from a chair with arms (e.g., wheelchair, bedside commode, etc,.)?: Unable Help needed moving to and from a bed to chair (including a wheelchair)?: A Lot Help needed walking in hospital room?: Total Help needed climbing 3-5 steps with a railing? : Total 6 Click Score: 9    End of Session Equipment Utilized During Treatment: Gait belt Activity Tolerance: Other (comment)(limited by fear) Patient left: with call bell/phone within reach;in chair Nurse Communication: Mobility status PT Visit Diagnosis: Unsteadiness on feet (R26.81);Muscle weakness (generalized) (M62.81)     Time: 1779-3903 PT Time Calculation (min) (ACUTE ONLY): 25 min  Charges:  $Therapeutic Activity: 8-22 mins                    G Codes:      Benjiman Core, [place] Pager [phone] Acute Rehab   Allena Katz 05/19/2018, 11:48 AM

## 2018-05-19 NOTE — Progress Notes (Signed)
S:Feels a little better today but having some diarrhea O:BP (!) 124/57   Pulse 66   Temp 97.9 F (36.6 C)   Resp 17   Ht 5\' 7"  (1.702 m)   Wt 78.7 kg (173 lb 8 oz)   SpO2 95%   BMI 27.17 kg/m   Intake/Output Summary (Last 24 hours) at 05/19/2018 1154 Last data filed at 05/19/2018 1037 Gross per 24 hour  Intake 840 ml  Output 1625 ml  Net -785 ml   Intake/Output: I/O last 3 completed shifts: In: 1330 [P.O.:1320; I.V.:10] Out: 3300 [Urine:3300]  Intake/Output this shift:  Total I/O In: -  Out: 500 [Urine:500] Weight change: 1.1 kg (2 lb 6.8 oz) Gen: NAD CVS: no rub Resp: cta Abd: +BS, soft, mildly tender Ext:s/p LAKA, 1+ edema  Recent Labs  Lab 05/13/18 0636 05/14/18 0628 05/15/18 0729 05/16/18 0500 05/17/18 0712 05/18/18 0552 05/19/18 0519  NA 137 139 138 140 140 139 138  K 4.4 4.1 3.7 4.0 3.5 3.7 3.6  CL 101 103 101 102 100 101 97*  CO2 27 26 27 28 27 29 29   GLUCOSE 118* 125* 140* 127* 115* 100* 117*  BUN 25* 30* 33* 34* 38* 38* 38*  CREATININE 4.54* 4.90* 5.13* 5.42* 5.34* 5.12* 4.71*  ALBUMIN 1.8* 1.9* 2.0* 1.9* 2.0* 2.0* 2.2*  CALCIUM 8.0* 8.2* 8.1* 8.0* 8.2* 8.2* 8.5*  PHOS 4.1 4.4 4.7* 5.0* 4.7* 4.9* 5.1*   Liver Function Tests: Recent Labs  Lab 05/17/18 0712 05/18/18 0552 05/19/18 0519  ALBUMIN 2.0* 2.0* 2.2*   No results for input(s): LIPASE, AMYLASE in the last 168 hours. No results for input(s): AMMONIA in the last 168 hours. CBC: Recent Labs  Lab 05/13/18 0635 05/14/18 0628 05/15/18 0729 05/16/18 0500 05/17/18 0712  WBC 8.9 8.4 8.2 6.8 6.6  HGB 7.9* 8.7* 8.9* 8.3* 9.0*  HCT 27.1* 29.4* 29.6* 28.0* 30.6*  MCV 87.4 87.0 86.5 87.2 86.7  PLT 219 232 265 256 304   Cardiac Enzymes: No results for input(s): CKTOTAL, CKMB, CKMBINDEX, TROPONINI in the last 168 hours. CBG: Recent Labs  Lab 05/18/18 1125 05/18/18 1557 05/18/18 2120 05/19/18 0726 05/19/18 1142  GLUCAP 117* 127* 140* 114* 128*    Iron Studies: No results for  input(s): IRON, TIBC, TRANSFERRIN, FERRITIN in the last 72 hours. Studies/Results: No results found. Marland Kitchen amiodarone  400 mg Oral Daily  . aspirin  81 mg Oral Daily  . chlorhexidine  15 mL Mouth Rinse BID  . Chlorhexidine Gluconate Cloth  6 each Topical Q0600  . darbepoetin (ARANESP) injection - DIALYSIS  100 mcg Intravenous Q Thu-HD  . diphenoxylate-atropine  2 tablet Oral QID  . ferrous sulfate  325 mg Oral BID WC  . furosemide  80 mg Intravenous Q12H  . heparin injection (subcutaneous)  5,000 Units Subcutaneous Q8H  . insulin aspart  0-5 Units Subcutaneous QHS  . insulin aspart  0-9 Units Subcutaneous TID WC  . mouth rinse  15 mL Mouth Rinse q12n4p  . metoprolol tartrate  12.5 mg Oral BID  . multivitamin  1 tablet Oral QHS  . pantoprazole  40 mg Oral BID AC  . rosuvastatin  20 mg Oral q1800  . sodium chloride flush  10-40 mL Intracatheter Q12H    BMET    Component Value Date/Time   NA 138 05/19/2018 0519   K 3.6 05/19/2018 0519   CL 97 (L) 05/19/2018 0519   CO2 29 05/19/2018 0519   GLUCOSE 117 (H) 05/19/2018 5809  BUN 38 (H) 05/19/2018 0519   CREATININE 4.71 (H) 05/19/2018 0519   CREATININE 1.24 (H) 08/12/2017 1530   CALCIUM 8.5 (L) 05/19/2018 0519   GFRNONAA 10 (L) 05/19/2018 0519   GFRNONAA 50 (L) 08/12/2017 1530   GFRAA 11 (L) 05/19/2018 0519   GFRAA 57 (L) 08/12/2017 1530   CBC    Component Value Date/Time   WBC 6.6 05/17/2018 0712   RBC 3.53 (L) 05/17/2018 0712   HGB 9.0 (L) 05/17/2018 0712   HCT 30.6 (L) 05/17/2018 0712   PLT 304 05/17/2018 0712   MCV 86.7 05/17/2018 0712   MCH 25.5 (L) 05/17/2018 0712   MCHC 29.4 (L) 05/17/2018 0712   RDW 21.5 (H) 05/17/2018 0712   LYMPHSABS 2.1 05/03/2018 0427   MONOABS 0.9 05/03/2018 0427   EOSABS 0.6 05/03/2018 0427   BASOSABS 0.1 05/03/2018 0427   Assessment/Plan:  1. AKI in setting of septic shock consistent with ischemic ATN.  Started on HD 04/29/18 via tunneled HD cath and last HD on 05/09/18.  Cr continues to  slowly improve.  No indication for dialysis at this time and will continue following.  Hopefully we can d/c tdc if creatinine continues to improve, unless cardiac cath is planned for this hospitalization.  Baseline Cr was 0.9-1 2. Sepsis- s/p abx and stress dose steroids, now resolved 3. PAD- s/p LAKA 04/26/18 4. NSTEMI- may need cath but on hold due to AKI 5. A fib with h/o RVR 6. Anemia of chronic disease- on aranesp, FOBT +, heparin discontinued. 7. DM- per primary 8. 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  Avoid ACE/ARB/Aldactone given AKI.   Donetta Potts, MD [employer] [phone]

## 2018-05-20 ENCOUNTER — Inpatient Hospital Stay (HOSPITAL_COMMUNITY): Payer: Medicare Other

## 2018-05-20 ENCOUNTER — Encounter (HOSPITAL_COMMUNITY): Payer: Self-pay | Admitting: Student

## 2018-05-20 DIAGNOSIS — M79605 Pain in left leg: Secondary | ICD-10-CM

## 2018-05-20 HISTORY — PX: IR REMOVAL TUN CV CATH W/O FL: IMG2289

## 2018-05-20 LAB — RENAL FUNCTION PANEL
Albumin: 2.2 g/dL — ABNORMAL LOW (ref 3.5–5.0)
Anion gap: 11 (ref 5–15)
BUN: 39 mg/dL — AB (ref 6–20)
CHLORIDE: 100 mmol/L (ref 98–111)
CO2: 30 mmol/L (ref 22–32)
CREATININE: 4.49 mg/dL — AB (ref 0.44–1.00)
Calcium: 8.3 mg/dL — ABNORMAL LOW (ref 8.9–10.3)
GFR calc Af Amer: 12 mL/min — ABNORMAL LOW (ref >60)
GFR calc non Af Amer: 10 mL/min — ABNORMAL LOW (ref >60)
GLUCOSE: 143 mg/dL — AB (ref 70–99)
POTASSIUM: 3.7 mmol/L (ref 3.5–5.1)
Phosphorus: 4.5 mg/dL (ref 2.5–4.6)
SODIUM: 141 mmol/L (ref 135–145)

## 2018-05-20 LAB — GLUCOSE, CAPILLARY
GLUCOSE-CAPILLARY: 163 mg/dL — AB (ref 70–99)
GLUCOSE-CAPILLARY: 167 mg/dL — AB (ref 70–99)
GLUCOSE-CAPILLARY: 149 mg/dL — AB (ref 70–99)
Glucose-Capillary: 140 mg/dL — ABNORMAL HIGH (ref 70–99)

## 2018-05-20 LAB — MAGNESIUM: Magnesium: 1.5 mg/dL — ABNORMAL LOW (ref 1.7–2.4)

## 2018-05-20 MED ORDER — LIDOCAINE HCL 1 % IJ SOLN
INTRAMUSCULAR | Status: AC
  Filled 2018-05-20: qty 20

## 2018-05-20 MED ORDER — DARBEPOETIN ALFA 100 MCG/0.5ML IJ SOSY
100.0000 ug | PREFILLED_SYRINGE | INTRAMUSCULAR | Status: DC
  Administered 2018-05-21: 100 ug via SUBCUTANEOUS
  Filled 2018-05-20: qty 0.5

## 2018-05-20 MED ORDER — MAGNESIUM SULFATE 2 GM/50ML IV SOLN
2.0000 g | Freq: Once | INTRAVENOUS | Status: AC
  Administered 2018-05-20: 2 g via INTRAVENOUS
  Filled 2018-05-20: qty 50

## 2018-05-20 MED ORDER — CHLORHEXIDINE GLUCONATE 4 % EX LIQD
CUTANEOUS | Status: AC
  Filled 2018-05-20: qty 15

## 2018-05-20 NOTE — Progress Notes (Addendum)
PROGRESS NOTE    [name]  YCX:[id number] DOB: [birth year] DOA: 04/24/2018 PCP: [name], MD    Brief Narrative:  52 year old female who presented with sepsis syndrome.  Does have significant past medical history of type 2 diabetes mellitus, and atrial fibrillation.  Patient developed acute altered mental status, left lower leg pain, emesis and diarrhea.  He had a recent left femoral revascularization, (05/10-5/140, on June 3th follow-up appointment found him hypotensive and febrile with occlusion of bypass graft.  He was advised to go to the emergency room.  He refused to get further assistance, and had clindamycin prescribed.  On her initial physical examination blood pressure 71/48, was refractory to IV fluids, required vasopressors.  She was lethargic, dry mucous membranes, irregular heart rate, lungs clear to auscultation, abdomen soft nontender, left lower extremity with edema, erythema and tenderness.  Sodium 130, potassium 3.9, chloride 97, bicarb 19, glucose 147, BUN 60, creatinine 8.112, CK 1049, white count 24.7, hemoglobin 7.5, hematocrit 24.2, platelets 379.  Chest x-ray with cardiomegaly and increased lung markings bilaterally.  EKG with sinus tachycardia with a left bundle branch block.  It was admitted to the intensive care unit with a working diagnosis of septic shock due to gangrene on left foot.   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 Resolved sepsis. Left foot gangrene. Patient completed antibiotic therapy, sp left below the knee amputation. No further signs of active infection. On amioda  2. ATN due to sepsis syndrome with hypomagnesemia. Patient has a temporary HD catheter in place, last HD on 6/22. Urine output 2,100 over last 24 hours, renal function with stable cr at 4,49 with K at 3,7 and serum bicarbonate at 30. No indication for hd today, will continue to follow with nephrology recommendations. Continue mag correction with mag sulfate.   3. Chronic systolic heart failure. Ejection fraction 35%. Continue b blocker, no ace inh due to aki. Due to recovery of renal failure, will avoid contrast exposure on this admission.   4. Paroxysmal atrial fibrillation. Has remained rate control, will decrease amiodarone to 200 mg in am, has not been on anticoagulation due to anemia.   4. T2DM. Will continue glucose cover and monitoring with insulin sliding scale. Patient tolerating po well.   5. Anemia of chronic renal disease. No signs of active bleeding, patient sp one unit prbc transfusion    DVT prophylaxis: heparin   Code Status:  full Family Communication: no family at the bedside  Disposition Plan: CIR    Consultants:    Nephrology   Cardiology   Procedures:     Antimicrobials:      Subjective: Patient is feeling better, continue to have edema on the right lower extremity, no nausea or vomiting, no chest pain or dyspnea. Feeling very weak.   Objective: Vitals:  05/19/18 2014 05/19/18 2219 05/20/18 0327 05/20/18 0628  BP: (!) 99/56 (!) 112/53  115/67  Pulse: (!) 59 62  64  Resp: 20   18  Temp: 98.4 F (36.9 C)   98.4 F (36.9 C)  TempSrc: Oral   Oral  SpO2: 97%   97%  Weight:   77.5 kg (170 lb 13.7 oz)   Height:        Intake/Output Summary (Last 24 hours) at 05/20/2018  1033 Last data filed at 05/20/2018 0824 Gross per 24 hour  Intake 360 ml  Output 2100 ml  Net -1740 ml   Filed Weights   05/19/18 0455 05/19/18 0544 05/20/18 0327  Weight: 81.1 kg (178 lb 12.7 oz) 78.7 kg (173 lb 8 oz) 77.5 kg (170 lb 13.7 oz)    Examination:   General: Not in pain or dyspnea, deconditioned  Neurology: Awake and alert, non focal  E ENT: positive pallor, no icterus, oral mucosa moist Cardiovascular: No JVD. S1-S2 present, rhythmic, no gallops, rubs, or murmurs. +++ pitting right lower extremity edema. Pulmonary: vesicular breath sounds bilaterally, adequate air movement, no wheezing, rhonchi or rales. Gastrointestinal. Abdomen with no organomegaly, non tender, no rebound or guarding Skin. No rashes Musculoskeletal: no joint deformities/ below the knee amputation on the left.      Data Reviewed: I have personally reviewed following labs and imaging studies  CBC: Recent Labs  Lab 05/14/18 0628 05/15/18 0729 05/16/18 0500 05/17/18 0712  WBC 8.4 8.2 6.8 6.6  HGB 8.7* 8.9* 8.3* 9.0*  HCT 29.4* 29.6* 28.0* 30.6*  MCV 87.0 86.5 87.2 86.7  PLT 232 265 256 010   Basic Metabolic Panel: Recent Labs  Lab 05/16/18 0500 05/17/18 0712 05/18/18 0552 05/19/18 0519 05/20/18 0657  NA 140 140 139 138 141  K 4.0 3.5 3.7 3.6 3.7  CL 102 100 101 97* 100  CO2 28 27 29 29 30   GLUCOSE 127* 115* 100* 117* 143*  BUN 34* 38* 38* 38* 39*  CREATININE 5.42* 5.34* 5.12* 4.71* 4.49*  CALCIUM 8.0* 8.2* 8.2* 8.5* 8.3*  MG 2.0 1.9 1.8 1.6* 1.5*  PHOS 5.0* 4.7* 4.9* 5.1* 4.5   GFR: Estimated Creatinine Clearance: 15.4 mL/min (A) (by C-G formula based on SCr of 4.49 mg/dL (H)). Liver Function Tests: Recent Labs  Lab 05/16/18 0500 05/17/18 0712 05/18/18 0552 05/19/18 0519 05/20/18 0657  ALBUMIN 1.9* 2.0* 2.0* 2.2* 2.2*   No results for input(s): LIPASE, AMYLASE in the last 168 hours. No results for input(s): AMMONIA in the last 168 hours. Coagulation Profile: No  results for input(s): INR, PROTIME in the last 168 hours. Cardiac Enzymes: No results for input(s): CKTOTAL, CKMB, CKMBINDEX, TROPONINI in the last 168 hours. BNP (last 3 results) No results for input(s): PROBNP in the last 8760 hours. HbA1C: No results for input(s): HGBA1C in the last 72 hours. CBG: Recent Labs  Lab 05/19/18 0726 05/19/18 1142 05/19/18 1706 05/19/18 2129 05/20/18 0734  GLUCAP 114* 128* 207* 121* 140*   Lipid Profile: No results for input(s): CHOL, HDL, LDLCALC, TRIG, CHOLHDL, LDLDIRECT in the last 72 hours. Thyroid Function Tests: No results for input(s): TSH, T4TOTAL, FREET4, T3FREE, THYROIDAB in the last 72 hours. Anemia Panel: No results for input(s): VITAMINB12, FOLATE, FERRITIN, TIBC, IRON, RETICCTPCT in the last 72 hours.    Radiology Studies: I have reviewed all of the imaging during this hospital visit personally     Scheduled Meds: . amiodarone  400 mg Oral Daily  . aspirin  81  mg Oral Daily  . chlorhexidine  15 mL Mouth Rinse BID  . Chlorhexidine Gluconate Cloth  6 each Topical Q0600  . darbepoetin (ARANESP) injection - DIALYSIS  100 mcg Intravenous Q Thu-HD  . diphenoxylate-atropine  2 tablet Oral QID  . ferrous sulfate  325 mg Oral BID WC  . furosemide  80 mg Intravenous Q12H  . heparin injection (subcutaneous)  5,000 Units Subcutaneous Q8H  . insulin aspart  0-5 Units Subcutaneous QHS  . insulin aspart  0-9 Units Subcutaneous TID WC  . mouth rinse  15 mL Mouth Rinse q12n4p  . metoprolol tartrate  12.5 mg Oral BID  . multivitamin  1 tablet Oral QHS  . pantoprazole  40 mg Oral BID AC  . rosuvastatin  20 mg Oral q1800  . sodium chloride flush  10-40 mL Intracatheter Q12H   Continuous Infusions: . sodium chloride 10 mL/hr at 05/08/18 0027     LOS: 25 days        Elise Knobloch Gerome Apley, MD [name] Pager [phone]

## 2018-05-20 NOTE — Progress Notes (Signed)
S: Feels better today O:BP (!) 122/53 (BP Location: Right Arm)   Pulse 65   Temp 97.9 F (36.6 C) (Oral)   Resp 17   Ht 5\' 7"  (1.702 m)   Wt 77.5 kg (170 lb 13.7 oz)   SpO2 98%   BMI 26.76 kg/m   Intake/Output Summary (Last 24 hours) at 05/20/2018 1504 Last data filed at 05/20/2018 1330 Gross per 24 hour  Intake 600 ml  Output 1600 ml  Net -1000 ml   Intake/Output: I/O last 3 completed shifts: In: 480 [P.O.:480] Out: 2825 [Urine:2825]  Intake/Output this shift:  Total I/O In: 480 [P.O.:480] Out: -  Weight change: -3.6 kg (-7 lb 15 oz) Gen:NAD CVS: no rub Resp: cta Abd: benign Ext: 1+ edema, s/p LAKA  Recent Labs  Lab 05/14/18 0628 05/15/18 0729 05/16/18 0500 05/17/18 0712 05/18/18 0552 05/19/18 0519 05/20/18 0657  NA 139 138 140 140 139 138 141  K 4.1 3.7 4.0 3.5 3.7 3.6 3.7  CL 103 101 102 100 101 97* 100  CO2 26 27 28 27 29 29 30   GLUCOSE 125* 140* 127* 115* 100* 117* 143*  BUN 30* 33* 34* 38* 38* 38* 39*  CREATININE 4.90* 5.13* 5.42* 5.34* 5.12* 4.71* 4.49*  ALBUMIN 1.9* 2.0* 1.9* 2.0* 2.0* 2.2* 2.2*  CALCIUM 8.2* 8.1* 8.0* 8.2* 8.2* 8.5* 8.3*  PHOS 4.4 4.7* 5.0* 4.7* 4.9* 5.1* 4.5   Liver Function Tests: Recent Labs  Lab 05/18/18 0552 05/19/18 0519 05/20/18 0657  ALBUMIN 2.0* 2.2* 2.2*   No results for input(s): LIPASE, AMYLASE in the last 168 hours. No results for input(s): AMMONIA in the last 168 hours. CBC: Recent Labs  Lab 05/14/18 0628 05/15/18 0729 05/16/18 0500 05/17/18 0712  WBC 8.4 8.2 6.8 6.6  HGB 8.7* 8.9* 8.3* 9.0*  HCT 29.4* 29.6* 28.0* 30.6*  MCV 87.0 86.5 87.2 86.7  PLT 232 265 256 304   Cardiac Enzymes: No results for input(s): CKTOTAL, CKMB, CKMBINDEX, TROPONINI in the last 168 hours. CBG: Recent Labs  Lab 05/19/18 1142 05/19/18 1706 05/19/18 2129 05/20/18 0734 05/20/18 1145  GLUCAP 128* 207* 121* 140* 163*    Iron Studies: No results for input(s): IRON, TIBC, TRANSFERRIN, FERRITIN in the last 72  hours. Studies/Results: No results found. Marland Kitchen amiodarone  400 mg Oral Daily  . aspirin  81 mg Oral Daily  . chlorhexidine  15 mL Mouth Rinse BID  . Chlorhexidine Gluconate Cloth  6 each Topical Q0600  . [START ON 05/21/2018] darbepoetin (ARANESP) injection - NON-DIALYSIS  100 mcg Subcutaneous Q Thu-1800  . diphenoxylate-atropine  2 tablet Oral QID  . ferrous sulfate  325 mg Oral BID WC  . furosemide  80 mg Intravenous Q12H  . heparin injection (subcutaneous)  5,000 Units Subcutaneous Q8H  . insulin aspart  0-5 Units Subcutaneous QHS  . insulin aspart  0-9 Units Subcutaneous TID WC  . mouth rinse  15 mL Mouth Rinse q12n4p  . metoprolol tartrate  12.5 mg Oral BID  . multivitamin  1 tablet Oral QHS  . pantoprazole  40 mg Oral BID AC  . rosuvastatin  20 mg Oral q1800  . sodium chloride flush  10-40 mL Intracatheter Q12H    BMET    Component Value Date/Time   NA 141 05/20/2018 0657   K 3.7 05/20/2018 0657   CL 100 05/20/2018 0657   CO2 30 05/20/2018 0657   GLUCOSE 143 (H) 05/20/2018 0657   BUN 39 (H) 05/20/2018 0657   CREATININE 4.49 (  H) 05/20/2018 0657   CREATININE 1.24 (H) 08/12/2017 1530   CALCIUM 8.3 (L) 05/20/2018 0657   GFRNONAA 10 (L) 05/20/2018 0657   GFRNONAA 50 (L) 08/12/2017 1530   GFRAA 12 (L) 05/20/2018 0657   GFRAA 57 (L) 08/12/2017 1530   CBC    Component Value Date/Time   WBC 6.6 05/17/2018 0712   RBC 3.53 (L) 05/17/2018 0712   HGB 9.0 (L) 05/17/2018 0712   HCT 30.6 (L) 05/17/2018 0712   PLT 304 05/17/2018 0712   MCV 86.7 05/17/2018 0712   MCH 25.5 (L) 05/17/2018 0712   MCHC 29.4 (L) 05/17/2018 0712   RDW 21.5 (H) 05/17/2018 0712   LYMPHSABS 2.1 05/03/2018 0427   MONOABS 0.9 05/03/2018 0427   EOSABS 0.6 05/03/2018 0427   BASOSABS 0.1 05/03/2018 0427    Assessment/Plan:  1. AKIin setting of septic shock consistent with ischemic ATN. Started on HD 04/29/18 via tunneled HD cath and last HD on 05/09/18. Cr continues to slowly improve. No indication for  dialysis at this time.  1. Will consult IR for tdc removal 2. Cr slowly improving 3. Will sign off and follow peripherally while she remains an inpatient. 4. if cardiac cath is planned for this hospitalization, please reconsult 5. Will not need follow up after discharge if her creatinine returns to baseline of 0.9-1, however if not will require Nephrology care as an outpatient with our office. 2. Sepsis- s/p abx and stress dose steroids, now resolved 3. PAD- s/p LAKA 04/26/18 4. NSTEMI- may need cath but on hold due to AKI 5. A fib with h/o RVR 6. Anemia of chronic disease- on aranesp, FOBT +, heparin discontinued. 7. DM- per primary 8. 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 Avoid ACE/ARB/Aldactone given AKI.  F/u with Cardiology.  Donetta Potts, MD [employer] [phone]

## 2018-05-20 NOTE — Progress Notes (Signed)
[facility]  Subjective  - Doing better over all.  Sitting up at bedside eating breakfast.   Objective 115/67 64 98.4 F (36.9 C) (Oral) 18 97%  Intake/Output Summary (Last 24 hours) at 05/20/2018 0819 Last data filed at 05/20/2018 0600 Gross per 24 hour  Intake 120 ml  Output 2100 ml  Net -1980 ml    Left stump very well healed.  No erythema or drainage.  Stump warm to touch.   Right foot warm to touch without ulcers.  Superficial abrasion on anterior patella.  Dry dressing in place. 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  Plan to D/C staples prior to discharge.  Pending SNF placement for rehab and pending HD needs.  Currently has left TDC.   History of aortobifemoral bypass graft, and thrombectomy and bilateral femoral-popliteal bypass grafts. F/U with Dr. Trula Slade in Nov. 21019 For ABI right LE and bypass duplex.     [name] 05/20/2018 8:19 AM --  Laboratory Lab Results: No results for input(s): WBC, HGB, HCT, PLT in the last 72 hours. BMET Recent Labs    05/18/18 0552 05/19/18 0519  NA 139 138  K 3.7 3.6  CL 101 97*  CO2 29 29  GLUCOSE 100* 117*  BUN 38* 38*  CREATININE 5.12* 4.71*  CALCIUM 8.2* 8.5*    COAG Lab Results  Component Value Date   INR 1.28 05/03/2018   INR 1.15 03/24/2018   INR 1.40 11/28/2017   No results found for: PTT

## 2018-05-20 NOTE — Progress Notes (Signed)
Patient resting comfortably during shift report. Denies complaints.  

## 2018-05-20 NOTE — Care Management Important Message (Signed)
Important Message  Patient Details  Name: [name] MRN: [id number] Date of Birth: 03-07-1966   Medicare Important Message Given:  Yes    [name] P [name] 05/20/2018, 2:51 PM

## 2018-05-21 LAB — RENAL FUNCTION PANEL
Albumin: 2.3 g/dL — ABNORMAL LOW (ref 3.5–5.0)
Anion gap: 9 (ref 5–15)
BUN: 40 mg/dL — AB (ref 6–20)
CALCIUM: 8.6 mg/dL — AB (ref 8.9–10.3)
CHLORIDE: 97 mmol/L — AB (ref 98–111)
CO2: 33 mmol/L — ABNORMAL HIGH (ref 22–32)
CREATININE: 4.08 mg/dL — AB (ref 0.44–1.00)
GFR, EST AFRICAN AMERICAN: 13 mL/min — AB (ref >60)
GFR, EST NON AFRICAN AMERICAN: 11 mL/min — AB (ref >60)
Glucose, Bld: 147 mg/dL — ABNORMAL HIGH (ref 70–99)
Phosphorus: 4 mg/dL (ref 2.5–4.6)
Potassium: 3.7 mmol/L (ref 3.5–5.1)
SODIUM: 139 mmol/L (ref 135–145)

## 2018-05-21 LAB — GLUCOSE, CAPILLARY
GLUCOSE-CAPILLARY: 137 mg/dL — AB (ref 70–99)
GLUCOSE-CAPILLARY: 148 mg/dL — AB (ref 70–99)
Glucose-Capillary: 150 mg/dL — ABNORMAL HIGH (ref 70–99)
Glucose-Capillary: 152 mg/dL — ABNORMAL HIGH (ref 70–99)

## 2018-05-21 LAB — MAGNESIUM: MAGNESIUM: 1.8 mg/dL (ref 1.7–2.4)

## 2018-05-21 NOTE — Progress Notes (Signed)
PROGRESS NOTE    [name]  NKN:[id number] DOB: Sep 18, 1966 DOA: 04/24/2018 PCP: [name], MD    Brief Narrative:  52 year old female who presented with sepsis syndrome.  Does have significant past medical history of type 2 diabetes mellitus, and atrial fibrillation.  Patient developed acute altered mental status, left lower leg pain, emesis and diarrhea.  He had a recent left femoral revascularization, (05/10-5/140, on June 3th follow-up appointment found him hypotensive and febrile with occlusion of bypass graft.  He was advised to go to the emergency room.  He refused to get further assistance, and had clindamycin prescribed.  On her initial physical examination blood pressure 71/48, was refractory to IV fluids, required vasopressors.  She was lethargic, dry mucous membranes, irregular heart rate, lungs clear to auscultation, abdomen soft nontender, left lower extremity with edema, erythema and tenderness.  Sodium 130, potassium 3.9, chloride 97, bicarb 19, glucose 147, BUN 60, creatinine 8.112, CK 1049, white count 24.7, hemoglobin 7.5, hematocrit 24.2, platelets 379.  Chest x-ray with cardiomegaly and increased lung markings bilaterally.  EKG with sinus tachycardia with a left bundle branch block.  Patient was admitted to the intensive care unit with a working diagnosis of septic shock due to gangrene on left foot.   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 Resolved sepsis. Left foot gangrene.  SP left below the knee amputation. For inpatient rehab.   2. ATN due to sepsis syndrome with hypomagnesemia. HD catheter has been removed, will continue po furosemide due to hyepervolemic, urine output over last 24 hours, 3,425 ml. Serum cr down to 4.0 with K at 3,7 and serum bircarbonate at 33.   3. Chronic systolic heart failure. Ejection fraction 35%. Continue heart failure management with metoprolol. No plan cardiac catheterization for now. Holding ace inh due to decreased GFR. 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   4. Paroxysmal atrial fibrillation. Continue amiodarone for rate control, no anticoagulation due to anemia.  4. T2DM. Glucose cover and monitoring with insulin sliding scale. Patient tolerating po well. Capillary glucose 163, 167, 137, 148.   5. Anemia of chronic renal disease. Had one unit prbc transfusion during this hospitalization.     DVT prophylaxis: heparin   Code Status:  full Family Communication: no family at the bedside  Disposition Plan: CIR    Consultants:    Nephrology   Cardiology   Procedures:     Antimicrobials:      Subjective: Patient this am feeling better, HD catheter has been removed, no dyspnea or chest pain.  Positive lower extremity edema on the right. No nausea or vomiting and tolerating po well.   Objective: Vitals:   05/21/18 0445 05/21/18 0448 05/21/18 0835 05/21/18 1100  BP:  (!) 108/58 132/68 123/74  Pulse:  62 66 67  Resp:  16  18  Temp:  98.3 F (36.8 C)  98.3 F (36.8 C)  TempSrc:  Oral  Oral  SpO2:  97%  98%  Weight: 75.3 kg (166 lb 0.1 oz)     Height:        Intake/Output Summary (Last 24 hours) at 05/21/2018 1226 Last data filed at 05/21/2018 0900 Gross per 24 hour  Intake  773 ml  Output 2400 ml  Net -1627 ml   Filed Weights   05/19/18 0544 05/20/18 0327 05/21/18 0445  Weight: 78.7 kg (173 lb 8 oz) 77.5 kg (170 lb 13.7 oz) 75.3 kg (166 lb 0.1 oz)    Examination:   General: Not in pain or dyspnea, deconditioned  Neurology: Awake and alert, non focal  E ENT: positve pallor, no icterus, oral mucosa moist Cardiovascular: No JVD. S1-S2 present, rhythmic, no gallops, rubs, or murmurs. ++/+++ pitting right lower extremity edema. Pulmonary: decreased breath sounds bilaterally at bases, adequate air movement, no wheezing, rhonchi or rales. Gastrointestinal. Abdomen with no organomegaly, non tender, no rebound or guarding Skin. No rashes/ clean surgical wound on the left.  Musculoskeletal: no joint deformities/ left BKA      Data Reviewed: I have personally reviewed following labs and imaging studies  CBC: Recent Labs  Lab 05/15/18 0729 05/16/18 0500 05/17/18 0712  WBC 8.2 6.8 6.6  HGB 8.9* 8.3* 9.0*  HCT 29.6* 28.0* 30.6*  MCV 86.5 87.2 86.7  PLT 265 256 324   Basic Metabolic Panel: Recent Labs  Lab 05/17/18 0712 05/18/18 0552 05/19/18 0519 05/20/18 0657 05/21/18 0625  NA 140 139 138 141 139  K 3.5 3.7 3.6 3.7 3.7  CL 100 101 97* 100 97*  CO2 27 29 29 30  33*  GLUCOSE 115* 100* 117* 143* 147*  BUN 38* 38* 38* 39* 40*  CREATININE 5.34* 5.12* 4.71* 4.49* 4.08*  CALCIUM 8.2* 8.2* 8.5* 8.3* 8.6*  MG 1.9 1.8 1.6* 1.5* 1.8  PHOS 4.7* 4.9* 5.1* 4.5 4.0   GFR: Estimated Creatinine Clearance: 16.7 mL/min (A) (by C-G formula based on SCr of 4.08 mg/dL (H)). Liver Function Tests: Recent Labs  Lab 05/17/18 0712 05/18/18 0552 05/19/18 0519 05/20/18 0657 05/21/18 0625  ALBUMIN 2.0* 2.0* 2.2* 2.2* 2.3*   No results for input(s): LIPASE, AMYLASE in the last 168 hours. No results for input(s): AMMONIA in the last 168 hours. Coagulation Profile: No results for input(s): INR, PROTIME in the last 168 hours. Cardiac Enzymes: No results for  input(s): CKTOTAL, CKMB, CKMBINDEX, TROPONINI in the last 168 hours. BNP (last 3 results) No results for input(s): PROBNP in the last 8760 hours. HbA1C: No results for input(s): HGBA1C in the last 72 hours. CBG: Recent Labs  Lab 05/20/18 1145 05/20/18 1631 05/20/18 2115 05/21/18 0750 05/21/18 1126  GLUCAP 163* 167* 149* 137* 148*   Lipid Profile: No results for input(s): CHOL, HDL, LDLCALC, TRIG, CHOLHDL, LDLDIRECT in the last 72 hours. Thyroid Function Tests: No results for input(s): TSH, T4TOTAL, FREET4, T3FREE, THYROIDAB in the last 72 hours. Anemia Panel: No results for input(s): VITAMINB12, FOLATE, FERRITIN, TIBC, IRON, RETICCTPCT in the last 72 hours.    Radiology Studies: I have reviewed all of the imaging during this hospital visit personally     Scheduled Meds: . amiodarone  400 mg Oral Daily  . aspirin  81 mg Oral Daily  . darbepoetin (ARANESP) injection - NON-DIALYSIS  100 mcg Subcutaneous Q Thu-1800  . diphenoxylate-atropine  2 tablet Oral  QID  . ferrous sulfate  325 mg Oral BID WC  . furosemide  80 mg Intravenous Q12H  . heparin injection (subcutaneous)  5,000 Units Subcutaneous Q8H  . insulin aspart  0-5 Units Subcutaneous QHS  . insulin aspart  0-9 Units Subcutaneous TID WC  . metoprolol tartrate  12.5 mg Oral BID  . multivitamin  1 tablet Oral QHS  . pantoprazole  40 mg Oral BID AC  . rosuvastatin  20 mg Oral q1800  . sodium chloride flush  10-40 mL Intracatheter Q12H   Continuous Infusions: . sodium chloride 10 mL/hr at 05/08/18 0027     LOS: 26 days        Adalina Dopson Gerome Apley, MD [name] Pager [phone]

## 2018-05-21 NOTE — Progress Notes (Signed)
Inpatient Rehabilitation-Admissions Coordinator   [facility] has noted no plans for cardiac cath this admission as well as no current plans for continued dialysis. Pt appears to be nearing medical readiness for CIR. AC has begun insurance auth process. Please call if questions.   Jhonnie Garner, OTR/L  Rehab Admissions Coordinator  [phone] 05/21/2018 10:15 AM

## 2018-05-22 ENCOUNTER — Other Ambulatory Visit: Payer: Self-pay

## 2018-05-22 ENCOUNTER — Inpatient Hospital Stay (HOSPITAL_COMMUNITY)
Admission: RE | Admit: 2018-05-22 | Discharge: 2018-06-03 | DRG: 559 | Disposition: A | Discharge status: Home Health | Payer: Medicare Other | Source: Intra-hospital | Attending: Physical Medicine & Rehabilitation | Admitting: Physical Medicine & Rehabilitation

## 2018-05-22 ENCOUNTER — Encounter (HOSPITAL_COMMUNITY): Payer: Self-pay | Admitting: *Deleted

## 2018-05-22 DIAGNOSIS — I5032 Chronic diastolic (congestive) heart failure: Secondary | ICD-10-CM | POA: Diagnosis not present

## 2018-05-22 DIAGNOSIS — N170 Acute kidney failure with tubular necrosis: Secondary | ICD-10-CM | POA: Diagnosis present

## 2018-05-22 DIAGNOSIS — E1151 Type 2 diabetes mellitus with diabetic peripheral angiopathy without gangrene: Secondary | ICD-10-CM | POA: Diagnosis present

## 2018-05-22 DIAGNOSIS — L89313 Pressure ulcer of right buttock, stage 3: Secondary | ICD-10-CM | POA: Diagnosis present

## 2018-05-22 DIAGNOSIS — Z87891 Personal history of nicotine dependence: Secondary | ICD-10-CM

## 2018-05-22 DIAGNOSIS — J45909 Unspecified asthma, uncomplicated: Secondary | ICD-10-CM | POA: Diagnosis present

## 2018-05-22 DIAGNOSIS — M792 Neuralgia and neuritis, unspecified: Secondary | ICD-10-CM

## 2018-05-22 DIAGNOSIS — I5030 Unspecified diastolic (congestive) heart failure: Secondary | ICD-10-CM | POA: Diagnosis present

## 2018-05-22 DIAGNOSIS — E1142 Type 2 diabetes mellitus with diabetic polyneuropathy: Secondary | ICD-10-CM | POA: Diagnosis present

## 2018-05-22 DIAGNOSIS — Z794 Long term (current) use of insulin: Secondary | ICD-10-CM

## 2018-05-22 DIAGNOSIS — I2510 Atherosclerotic heart disease of native coronary artery without angina pectoris: Secondary | ICD-10-CM | POA: Diagnosis present

## 2018-05-22 DIAGNOSIS — D6489 Other specified anemias: Secondary | ICD-10-CM | POA: Diagnosis present

## 2018-05-22 DIAGNOSIS — E11319 Type 2 diabetes mellitus with unspecified diabetic retinopathy without macular edema: Secondary | ICD-10-CM | POA: Diagnosis not present

## 2018-05-22 DIAGNOSIS — Z89612 Acquired absence of left leg above knee: Secondary | ICD-10-CM | POA: Diagnosis not present

## 2018-05-22 DIAGNOSIS — G8929 Other chronic pain: Secondary | ICD-10-CM | POA: Diagnosis present

## 2018-05-22 DIAGNOSIS — K219 Gastro-esophageal reflux disease without esophagitis: Secondary | ICD-10-CM | POA: Diagnosis present

## 2018-05-22 DIAGNOSIS — Z79899 Other long term (current) drug therapy: Secondary | ICD-10-CM | POA: Diagnosis not present

## 2018-05-22 DIAGNOSIS — I5042 Chronic combined systolic (congestive) and diastolic (congestive) heart failure: Secondary | ICD-10-CM

## 2018-05-22 DIAGNOSIS — IMO0002 Reserved for concepts with insufficient information to code with codable children: Secondary | ICD-10-CM

## 2018-05-22 DIAGNOSIS — Z4781 Encounter for orthopedic aftercare following surgical amputation: Principal | ICD-10-CM

## 2018-05-22 DIAGNOSIS — R0989 Other specified symptoms and signs involving the circulatory and respiratory systems: Secondary | ICD-10-CM | POA: Diagnosis not present

## 2018-05-22 DIAGNOSIS — I214 Non-ST elevation (NSTEMI) myocardial infarction: Secondary | ICD-10-CM | POA: Diagnosis present

## 2018-05-22 DIAGNOSIS — L89159 Pressure ulcer of sacral region, unspecified stage: Secondary | ICD-10-CM | POA: Diagnosis present

## 2018-05-22 DIAGNOSIS — K5900 Constipation, unspecified: Secondary | ICD-10-CM | POA: Diagnosis present

## 2018-05-22 DIAGNOSIS — E785 Hyperlipidemia, unspecified: Secondary | ICD-10-CM | POA: Diagnosis present

## 2018-05-22 DIAGNOSIS — S78112A Complete traumatic amputation at level between left hip and knee, initial encounter: Secondary | ICD-10-CM

## 2018-05-22 DIAGNOSIS — E1152 Type 2 diabetes mellitus with diabetic peripheral angiopathy with gangrene: Secondary | ICD-10-CM

## 2018-05-22 DIAGNOSIS — Z7901 Long term (current) use of anticoagulants: Secondary | ICD-10-CM

## 2018-05-22 DIAGNOSIS — N179 Acute kidney failure, unspecified: Secondary | ICD-10-CM

## 2018-05-22 DIAGNOSIS — D62 Acute posthemorrhagic anemia: Secondary | ICD-10-CM

## 2018-05-22 DIAGNOSIS — E46 Unspecified protein-calorie malnutrition: Secondary | ICD-10-CM | POA: Diagnosis not present

## 2018-05-22 DIAGNOSIS — I739 Peripheral vascular disease, unspecified: Secondary | ICD-10-CM | POA: Diagnosis not present

## 2018-05-22 DIAGNOSIS — I4891 Unspecified atrial fibrillation: Secondary | ICD-10-CM

## 2018-05-22 DIAGNOSIS — I480 Paroxysmal atrial fibrillation: Secondary | ICD-10-CM | POA: Diagnosis present

## 2018-05-22 DIAGNOSIS — D638 Anemia in other chronic diseases classified elsewhere: Secondary | ICD-10-CM

## 2018-05-22 LAB — RENAL FUNCTION PANEL
ALBUMIN: 2.4 g/dL — AB (ref 3.5–5.0)
ANION GAP: 11 (ref 5–15)
BUN: 40 mg/dL — AB (ref 6–20)
CALCIUM: 8.4 mg/dL — AB (ref 8.9–10.3)
CO2: 30 mmol/L (ref 22–32)
CREATININE: 3.74 mg/dL — AB (ref 0.44–1.00)
Chloride: 97 mmol/L — ABNORMAL LOW (ref 98–111)
GFR calc Af Amer: 15 mL/min — ABNORMAL LOW (ref >60)
GFR calc non Af Amer: 13 mL/min — ABNORMAL LOW (ref >60)
GLUCOSE: 137 mg/dL — AB (ref 70–99)
Phosphorus: 4 mg/dL (ref 2.5–4.6)
Potassium: 3.4 mmol/L — ABNORMAL LOW (ref 3.5–5.1)
SODIUM: 138 mmol/L (ref 135–145)

## 2018-05-22 LAB — GLUCOSE, CAPILLARY
GLUCOSE-CAPILLARY: 160 mg/dL — AB (ref 70–99)
Glucose-Capillary: 163 mg/dL — ABNORMAL HIGH (ref 70–99)
Glucose-Capillary: 91 mg/dL (ref 70–99)
Glucose-Capillary: 132 mg/dL — ABNORMAL HIGH (ref 70–99)

## 2018-05-22 LAB — CREATININE, SERUM
Creatinine, Ser: 3.75 mg/dL — ABNORMAL HIGH (ref 0.44–1.00)
GFR, EST AFRICAN AMERICAN: 15 mL/min — AB (ref >60)
GFR, EST NON AFRICAN AMERICAN: 13 mL/min — AB (ref >60)

## 2018-05-22 LAB — CBC
HEMATOCRIT: 32.6 % — AB (ref 36.0–46.0)
Hemoglobin: 9.7 g/dL — ABNORMAL LOW (ref 12.0–15.0)
MCH: 25.9 pg — ABNORMAL LOW (ref 26.0–34.0)
MCHC: 29.8 g/dL — ABNORMAL LOW (ref 30.0–36.0)
MCV: 86.9 fL (ref 78.0–100.0)
Platelets: 296 10*3/uL (ref 150–400)
RBC: 3.75 MIL/uL — ABNORMAL LOW (ref 3.87–5.11)
RDW: 20.4 % — AB (ref 11.5–15.5)
WBC: 5.3 10*3/uL (ref 4.0–10.5)

## 2018-05-22 LAB — MAGNESIUM: Magnesium: 1.6 mg/dL — ABNORMAL LOW (ref 1.7–2.4)

## 2018-05-22 MED ORDER — FUROSEMIDE 80 MG PO TABS: 80.0000 mg | ORAL_TABLET | Freq: Every day | ORAL | 0 refills | Status: DC

## 2018-05-22 MED ORDER — DIPHENOXYLATE-ATROPINE 2.5-0.025 MG PO TABS
2.0000 | ORAL_TABLET | Freq: Four times a day (QID) | ORAL | Status: DC
  Administered 2018-05-22 – 2018-06-03 (×46): 2 via ORAL
  Filled 2018-05-22 (×46): qty 2

## 2018-05-22 MED ORDER — ASPIRIN 81 MG PO CHEW
81.0000 mg | CHEWABLE_TABLET | Freq: Every day | ORAL | Status: DC
  Administered 2018-05-23 – 2018-06-03 (×12): 81 mg via ORAL
  Filled 2018-05-22 (×12): qty 1

## 2018-05-22 MED ORDER — ACETAMINOPHEN 325 MG PO TABS: 650.0000 mg | ORAL_TABLET | Freq: Four times a day (QID) | ORAL | 0 refills | Status: DC | PRN

## 2018-05-22 MED ORDER — FUROSEMIDE 80 MG PO TABS: 80.0000 mg | ORAL_TABLET | Freq: Every day | ORAL | Status: DC

## 2018-05-22 MED ORDER — RENA-VITE PO TABS
1.0000 | ORAL_TABLET | Freq: Every day | ORAL | Status: DC
  Administered 2018-05-22 – 2018-06-02 (×12): 1 via ORAL
  Filled 2018-05-22 (×12): qty 1

## 2018-05-22 MED ORDER — RENA-VITE PO TABS: 1.0000 | ORAL_TABLET | Freq: Every day | ORAL | 0 refills | Status: AC

## 2018-05-22 MED ORDER — FERROUS SULFATE 325 (65 FE) MG PO TABS: 325.0000 mg | ORAL_TABLET | Freq: Two times a day (BID) | ORAL | 0 refills | Status: DC

## 2018-05-22 MED ORDER — HEPARIN SODIUM (PORCINE) 5000 UNIT/ML IJ SOLN: 5000.0000 [IU] | Freq: Three times a day (TID) | INTRAMUSCULAR | Status: DC

## 2018-05-22 MED ORDER — PANTOPRAZOLE SODIUM 40 MG PO TBEC
40.0000 mg | DELAYED_RELEASE_TABLET | Freq: Two times a day (BID) | ORAL | Status: DC
  Administered 2018-05-22 – 2018-06-03 (×24): 40 mg via ORAL
  Filled 2018-05-22 (×24): qty 1

## 2018-05-22 MED ORDER — NITROGLYCERIN 0.4 MG SL SUBL: 0.4000 mg | SUBLINGUAL_TABLET | SUBLINGUAL | Status: DC | PRN

## 2018-05-22 MED ORDER — ROSUVASTATIN CALCIUM 20 MG PO TABS
20.0000 mg | ORAL_TABLET | Freq: Every day | ORAL | Status: DC
  Administered 2018-05-22 – 2018-06-02 (×12): 20 mg via ORAL
  Filled 2018-05-22 (×12): qty 1

## 2018-05-22 MED ORDER — INSULIN LISPRO 100 UNIT/ML ~~LOC~~ SOLN: SUBCUTANEOUS | 0 refills | Status: DC

## 2018-05-22 MED ORDER — HEPARIN SODIUM (PORCINE) 5000 UNIT/ML IJ SOLN
5000.0000 [IU] | Freq: Three times a day (TID) | INTRAMUSCULAR | Status: DC
  Administered 2018-05-22 – 2018-06-03 (×36): 5000 [IU] via SUBCUTANEOUS
  Filled 2018-05-22 (×36): qty 1

## 2018-05-22 MED ORDER — AMIODARONE HCL 200 MG PO TABS: 200.0000 mg | ORAL_TABLET | Freq: Every day | ORAL | Status: DC

## 2018-05-22 MED ORDER — METOPROLOL TARTRATE 12.5 MG HALF TABLET
12.5000 mg | ORAL_TABLET | Freq: Two times a day (BID) | ORAL | Status: DC
  Administered 2018-05-22 – 2018-06-03 (×24): 12.5 mg via ORAL
  Filled 2018-05-22 (×24): qty 1

## 2018-05-22 MED ORDER — FUROSEMIDE 40 MG PO TABS
80.0000 mg | ORAL_TABLET | Freq: Every day | ORAL | Status: DC
  Administered 2018-05-23 – 2018-06-03 (×12): 80 mg via ORAL
  Filled 2018-05-22 (×12): qty 2

## 2018-05-22 MED ORDER — AMIODARONE HCL 200 MG PO TABS: 200.0000 mg | ORAL_TABLET | Freq: Every day | ORAL | 0 refills | Status: DC

## 2018-05-22 MED ORDER — ACETAMINOPHEN 325 MG PO TABS
650.0000 mg | ORAL_TABLET | Freq: Four times a day (QID) | ORAL | Status: DC | PRN
  Administered 2018-05-22 – 2018-06-02 (×12): 650 mg via ORAL
  Filled 2018-05-22 (×13): qty 2

## 2018-05-22 MED ORDER — LOPERAMIDE HCL 2 MG PO CAPS
2.0000 mg | ORAL_CAPSULE | ORAL | Status: DC | PRN
  Administered 2018-05-27: 2 mg via ORAL
  Filled 2018-05-22: qty 1

## 2018-05-22 MED ORDER — DARBEPOETIN ALFA 100 MCG/0.5ML IJ SOSY
100.0000 ug | PREFILLED_SYRINGE | INTRAMUSCULAR | Status: DC
  Administered 2018-05-28: 100 ug via SUBCUTANEOUS
  Filled 2018-05-22: qty 0.5

## 2018-05-22 MED ORDER — INSULIN ASPART 100 UNIT/ML ~~LOC~~ SOLN
0.0000 [IU] | Freq: Every day | SUBCUTANEOUS | Status: DC
  Administered 2018-05-25: 2 [IU] via SUBCUTANEOUS
  Administered 2018-06-02: 3 [IU] via SUBCUTANEOUS

## 2018-05-22 MED ORDER — ALBUTEROL SULFATE 2 MG PO TABS
2.0000 mg | ORAL_TABLET | Freq: Three times a day (TID) | ORAL | Status: DC | PRN
  Filled 2018-05-22: qty 1

## 2018-05-22 MED ORDER — AMIODARONE HCL 200 MG PO TABS
200.0000 mg | ORAL_TABLET | Freq: Every day | ORAL | Status: DC
  Administered 2018-05-23 – 2018-06-03 (×12): 200 mg via ORAL
  Filled 2018-05-22 (×12): qty 1

## 2018-05-22 MED ORDER — OXYCODONE HCL 5 MG PO TABS
5.0000 mg | ORAL_TABLET | Freq: Four times a day (QID) | ORAL | Status: DC | PRN
  Administered 2018-05-22 – 2018-06-02 (×25): 5 mg via ORAL
  Filled 2018-05-22 (×26): qty 1

## 2018-05-22 MED ORDER — FERROUS SULFATE 325 (65 FE) MG PO TABS
325.0000 mg | ORAL_TABLET | Freq: Two times a day (BID) | ORAL | Status: DC
  Administered 2018-05-22 – 2018-06-03 (×24): 325 mg via ORAL
  Filled 2018-05-22 (×24): qty 1

## 2018-05-22 NOTE — Progress Notes (Signed)
Physical Medicine and Rehabilitation Consult Reason for Consult:Decreased functional mobility Referring Physician: [facility]     HPI: [name] is a 52 y.o. right handed female with history of atrial fibrillation on Xarelto, diabetes mellitus peripheral vascular disease.  Per chart review patient lives with spouse.  Used a Rollator walker prior to admission.  Husband works during the day.  Patient with recent admission 5-10-5/14/2019 for redo of left femoral to PTA bypass.  Postoperatively needed thrombectomy.  Recent follow-up with vascular surgery with findings of occluded bypass graft.  Presented 04/25/2018 with leg pain and ischemic changes.  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.  Hospital course pain management.  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  Patient developed septic shock follow-up per pulmonary services.  Findings of elevated troponin felt to be related to non-ST elevation MI in the setting of hypotension.  Patient maintained on intravenous heparin.  Consideration has been made for possible need for cardiac catheterization.  AKI in the setting of septic shock CK of 1049.  Maintain on IV fluids follow-up per renal services with latest creatinine 4.90 and hemodialysis was initiated 04/29/2018 with tunneled catheter placed.  Dialysis has been placed on hold with latest dialysis 05/09/2018.  Acute blood loss anemia 8.7 and monitored.  Therapy evaluations completed and ongoing.  MD has requested physical medicine rehab consult.     Review of Systems  Constitutional: Negative for fever.  HENT: Negative for hearing loss.   Eyes: Negative for blurred vision and double vision.  Respiratory: Negative for cough and shortness of breath.   Cardiovascular: Positive for leg swelling. Negative for chest pain and palpitations.  Gastrointestinal: Positive for constipation. Negative for nausea.        GERD  Genitourinary: Negative for hematuria.  Musculoskeletal: Positive for myalgias.  Skin: Negative for rash.  Psychiatric/Behavioral: Positive for depression.  All other systems reviewed and are negative.       Past Medical History:  Diagnosis Date  . Abnormal stress test      a. 02/2017 MV: large region of fixed perfusion defect in basal to mid inf, mid-dist inflat walls, EF 43%. No ischemia (EF 55-60% by f/u echo).  . Arthritis    . Asthma    . Chronic back pain    . Coronary artery calcification seen on CT scan      a. 11/2017 CT Abd/Pelvis: Multi vessel coronary vascular Ca2+.  . Depression    . Diabetes mellitus    . Diabetic neuropathy ([place])    . Difficult intubation      DIFFICULT AIRWAY/FYI  . Family history of adverse reaction to anesthesia      mother had difficlty waking   . Femoral-popliteal bypass graft occlusion, left ([place]) 12/02/2017  . GERD (gastroesophageal reflux disease)    . History of echocardiogram      a. 03/2017 Echo: EF 55-60%, mild LVH, nl RV fxn.  . Hyperlipidemia    . 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 PAD (peripheral artery disease) ([place])    . Paroxysmal atrial fibrillation with rapid ventricular response ([place]) 12/02/2017    a. CHA2DS2VASc = 3-->Xarelto.  Marland Kitchen Ulcer      Foot         Past Surgical History:  Procedure Laterality Date  . ABDOMINAL AORTAGRAM   June 15, 2014  . ABDOMINAL AORTAGRAM N/A 06/15/2014    Procedure: ABDOMINAL AORTAGRAM;  Surgeon: Serafina Mitchell, MD;  Location: [facility] CATH LAB;  Service: Cardiovascular;  Laterality: N/A;  . ABDOMINAL AORTAGRAM N/A 11/22/2014    Procedure: ABDOMINAL AORTAGRAM;  Surgeon: Serafina Mitchell, MD;  Location: [facility] CATH LAB;  Service: Cardiovascular;  Laterality: N/A;  . ABDOMINAL AORTOGRAM W/LOWER EXTREMITY N/A 01/07/2017    Procedure: Abdominal Aortogram w/Lower Extremity;  Surgeon: Serafina Mitchell, MD;  Location: [place] CV LAB;  Service: Cardiovascular;  Laterality: N/A;  . ABDOMINAL  AORTOGRAM W/LOWER EXTREMITY N/A 10/31/2017    Procedure: ABDOMINAL AORTOGRAM W/LOWER EXTREMITY;  Surgeon: Elam Dutch, MD;  Location: [place] CV LAB;  Service: Cardiovascular;  Laterality: N/A;  . ABDOMINAL AORTOGRAM W/LOWER EXTREMITY N/A 03/24/2018    Procedure: ABDOMINAL AORTOGRAM W/LOWER EXTREMITY;  Surgeon: Serafina Mitchell, MD;  Location: [place] CV LAB;  Service: Cardiovascular;  Laterality: N/A;  . AMPUTATION Left 04/26/2018    Procedure: AMPUTATION ABOVE KNEE;  Surgeon: Elam Dutch, MD;  Location: [place];  Service: Vascular;  Laterality: Left;  . AORTA - BILATERAL FEMORAL ARTERY BYPASS GRAFT N/A 11/28/2017    Procedure: AORTA BIFEMORAL BYPASS USING HEMASHIELD GOLD GRAFT & REIMPLANT IMA;  Surgeon: Serafina Mitchell, MD;  Location: [facility] OR;  Service: Vascular;  Laterality: N/A;  . AORTIC ARCH ANGIOGRAPHY N/A 10/31/2017    Procedure: AORTIC ARCH ANGIOGRAPHY;  Surgeon: Elam Dutch, MD;  Location: [place] CV LAB;  Service: Cardiovascular;  Laterality: N/A;  . APPLICATION OF WOUND VAC   11/28/2017    Procedure: APPLICATION OF WOUND VAC;  Surgeon: Serafina Mitchell, MD;  Location: [facility] OR;  Service: Vascular;;  . APPLICATION OF WOUND VAC Left 03/27/2018    Procedure: APPLICATION OF WOUND VAC LEFT GROIN USING PREVENA PLUS;  Surgeon: Serafina Mitchell, MD;  Location: [place];  Service: Vascular;  Laterality: Left;  . ARTERIAL BYPASS SURGERY    07/05/2010    Right Common Femoral to below knee popliteal BPG  . BACK SURGERY        X's  2  . CARDIAC CATHETERIZATION      . CHOLECYSTECTOMY        Gall Bladder  . CYSTECTOMY Left      wrist  . EMBOLECTOMY Left 11/28/2017    Procedure: Left Lower Extremity Embolectomy, Left Tibial Peroneal Trunk Endarterectomy with Patch Angioplasty; Vein Harvest Small Saphenous Graft Left Lower Leg;  Surgeon: [name], MD;  Location: [place];  Service: Vascular;  Laterality: Left;  . FEMORAL-POPLITEAL BYPASS GRAFT Left 03/27/2018     Procedure: THROMBECTOMY OF LEFT FEMORAL TIBIAL BYPASS;  Surgeon: Serafina Mitchell, MD;  Location: [place];  Service: Vascular;  Laterality: Left;  . FEMORAL-TIBIAL BYPASS GRAFT Left 03/27/2018    Procedure: BYPASS GRAFT FEMORAL-TIBIAL ARTERY LEFT REDO USING CRYOPRESERVED SAPHENOUS VEIN 70cm;  Surgeon: Serafina Mitchell, MD;  Location: [facility] OR;  Service: Vascular;  Laterality: Left;  . INTERCOSTAL NERVE BLOCK   November 2015  . INTRAOPERATIVE ARTERIOGRAM   11/28/2017    Procedure: INTRA OPERATIVE ARTERIOGRAM OF LEFT LEG;  Surgeon: Serafina Mitchell, MD;  Location: [place];  Service: Vascular;;  . INTRAOPERATIVE ARTERIOGRAM Left 03/27/2018    Procedure: INTRA OPERATIVE ARTERIOGRAM TIMES TWO;  Surgeon: Serafina Mitchell, MD;  Location: [place];  Service: Vascular;  Laterality: Left;  . IR FLUORO GUIDE CV LINE RIGHT   03/20/2017  . IR FLUORO GUIDE CV LINE RIGHT   05/05/2018  . IR US GUIDE VASC ACCESS RIGHT   03/20/2017  .  IR US GUIDE VASC ACCESS RIGHT   05/05/2018  . IRRIGATION AND DEBRIDEMENT BUTTOCKS Right 09/30/2016    Procedure: DEBRIDEMENT RIGHT  BUTTOCK WOUND;  Surgeon: Georganna Skeans, MD;  Location: [place];  Service: General;  Laterality: Right;  . left foot surgery      . PERIPHERAL VASCULAR CATHETERIZATION N/A 05/07/2016    Procedure: Abdominal Aortogram;  Surgeon: Serafina Mitchell, MD;  Location: [place] CV LAB;  Service: Cardiovascular;  Laterality: N/A;  . PERIPHERAL VASCULAR CATHETERIZATION N/A 05/07/2016    Procedure: Lower Extremity Angiography;  Surgeon: Serafina Mitchell, MD;  Location: [place] CV LAB;  Service: Cardiovascular;  Laterality: N/A;  . PERIPHERAL VASCULAR CATHETERIZATION N/A 05/07/2016    Procedure: Aortic Arch Angiography;  Surgeon: Serafina Mitchell, MD;  Location: [place] CV LAB;  Service: Cardiovascular;  Laterality: N/A;  . PERIPHERAL VASCULAR CATHETERIZATION N/A 05/07/2016    Procedure: Upper Extremity Angiography;  Surgeon: Serafina Mitchell, MD;  Location: [place] CV LAB;   Service: Cardiovascular;  Laterality: N/A;  . PERIPHERAL VASCULAR CATHETERIZATION Right 05/07/2016    Procedure: Peripheral Vascular Balloon Angioplasty;  Surgeon: Serafina Mitchell, MD;  Location: [place] CV LAB;  Service: Cardiovascular;  Laterality: Right;  subclavian  . PERIPHERAL VASCULAR CATHETERIZATION Right 05/07/2016    Procedure: Peripheral Vascular Intervention;  Surgeon: Serafina Mitchell, MD;  Location: [place] CV LAB;  Service: Cardiovascular;  Laterality: Right;  External  Iliac  . SKIN GRAFT Right 2012    [name] by Dr. Nils Pyle- Right and Left Ankle  . SPINE SURGERY      . THROMBECTOMY FEMORAL ARTERY   11/28/2017    Procedure: THROMBECTOMY  & REVISION OF BILATERAL FEMORAL TO POPLETEAL ARTERIES;  Surgeon: Serafina Mitchell, MD;  Location: [facility] OR;  Service: Vascular;;  . TONSILLECTOMY             Family History  Problem Relation Age of Onset  . Coronary artery disease Mother    . Peripheral vascular disease Mother    . Heart disease Mother          Before age 17  . Other Mother          Venous insuffiency  . Diabetes Mother    . Hyperlipidemia Mother    . Hypertension Mother    . Varicose Veins Mother    . Heart attack Mother          before age 40  . Heart disease Father    . Diabetes Father    . Diabetes Maternal Grandmother    . Diabetes Paternal Grandmother    . Diabetes Paternal Grandfather    . Diabetes Sister    . Hypertension Sister    . Diabetes Brother    . Hypertension Brother      Social History:  reports that she quit smoking about 15 months ago. Her smoking use included cigarettes. She quit after 30.00 years of use. She has never used smokeless tobacco. She reports that she does not drink alcohol or use drugs. Allergies:      Allergies  Allergen Reactions  . Lactose Intolerance (Gi)            Medications Prior to Admission  Medication Sig Dispense Refill  . albuterol (PROVENTIL) 2 MG tablet Take 2 mg by mouth 3 (three) times daily as needed for  shortness of breath.       Marland Kitchen aspirin EC 81 MG tablet Take 81 mg by mouth every  evening.      . cetirizine (ZYRTEC) 10 MG tablet Take 10 mg by mouth daily.       Marland Kitchen HUMALOG KWIKPEN 100 UNIT/ML KiwkPen Inject 10 Units into the skin See admin instructions. 10 units three times a day before meals per sliding scale      . HYDROcodone-acetaminophen (NORCO) 10-325 MG tablet Take 1 tablet by mouth 2 (two) times daily as needed for severe pain.       Marland Kitchen LANTUS SOLOSTAR 100 UNIT/ML Solostar Pen Inject 36 Units into the skin 2 (two) times daily.       Marland Kitchen loperamide (IMODIUM A-D) 2 MG tablet Take 12 mg by mouth daily as needed for diarrhea or loose stools.      . metoprolol tartrate (LOPRESSOR) 25 MG tablet Take 0.5 tablets (12.5 mg total) by mouth 2 (two) times daily. 90 tablet 3  . omeprazole (PRILOSEC) 20 MG capsule Take 20 mg by mouth daily.      . ondansetron (ZOFRAN-ODT) 4 MG disintegrating tablet DISSOLVE 1 TABLET IN MOUTH EVERY 8 HOURS AS NEEDED FOR NAUSEA OR VOMITING 20 tablet 0  . pregabalin (LYRICA) 100 MG capsule Limit 1 tab by mouth twice a day to 3 times a day if tolerated (Patient taking differently: Take 100 mg by mouth 2 (two) times daily. ) 90 capsule 0  . rivaroxaban (XARELTO) 20 MG TABS tablet Take 1 tablet (20 mg total) by mouth daily with supper. 30 tablet 2  . rosuvastatin (CRESTOR) 20 MG tablet Take 1 tablet (20 mg total) by mouth daily at 6 PM. 30 tablet 2      Home: Home Living Family/patient expects to be discharged to:: Skilled nursing facility  Functional History: Prior Function Level of Independence: Independent with assistive device(s) Comments: Pt reporting she was using a rollator PTA. Reports she performed ADLs. Functional Status:  Mobility: Bed Mobility Overal bed mobility: Needs Assistance Bed Mobility: Rolling Rolling: Min assist Supine to sit: Min guard, Min assist Sit to supine: Min guard, Min assist General bed mobility comments: Min A to roll for manage  LLE Transfers Overall transfer level: Needs assistance Equipment used: 1 person hand held assist Transfers: Sit to/from Stand, Lateral/Scoot Transfers Sit to Stand: Total assist Stand pivot transfers: Mod assist  Lateral/Scoot Transfers: Mod assist General transfer comment: Max A +2 for sit<>stand x2 with right knee blocked. Using Max A and use of bed pad to facilitate hip movement to recliner Ambulation/Gait General Gait Details: unable   ADL: ADL Overall ADL's : Needs assistance/impaired Eating/Feeding: Set up, Sitting Eating/Feeding Details (indicate cue type and reason): Set up for lunch Grooming: Sitting, Wash/dry face, Set up, Supervision/safety Grooming Details (indicate cue type and reason): washing face while sitting in recliner Upper Body Bathing: Minimal assistance, Sitting Lower Body Bathing: Maximal assistance, Sitting/lateral leans, Bed level Upper Body Dressing : Minimal assistance, Sitting Lower Body Dressing: Maximal assistance, Sitting/lateral leans, Bed level Toilet Transfer: Maximal assistance, +2 for physical assistance, Stand-pivot(Drop arm recliner) Toilet Transfer Details (indicate cue type and reason): Pt performing stand pivot transfer with Max A +2 to drop arm recliner. Pt requiring R knee blocked and use of bed pad to lift and manage hips Toileting- Clothing Manipulation and Hygiene: Maximal assistance, Bed level Toileting - Clothing Manipulation Details (indicate cue type and reason): Pt requiring Min A to manage LLE for rolling to be placed on bed pad. Pt requiring Max A to performing toilet hygiene to clean up after BM in bed before placing on  bed pan. Functional mobility during ADLs: Maximal assistance, +2 for physical assistance(stand pivot only to drop arm recliner) General ADL Comments: Upon arrival, pt stating she had been waiting to be placed on bed pan. Pt agreeable to attempt lateral scoot to [facility], but stating she thinks she has already had BM in bed.  Pt requiring Min A to manage LLE during roll to perform toilet hygiene and place in bed pan. Pt crying at end of session because she is upset she soiled herself in bed. Upon checking call system, pt has been waiting 10 minutes before OT arrival.    Cognition: Cognition Overall Cognitive Status: No family/caregiver present to determine baseline cognitive functioning Orientation Level: Oriented X4 Cognition Arousal/Alertness: Awake/alert Behavior During Therapy: WFL for tasks assessed/performed Overall Cognitive Status: No family/caregiver present to determine baseline cognitive functioning General Comments: Upon arrival, pt verablizing she had waiting and called for assistance for the bed pan for awhile. During toileting at bed level, pt becoming tearful stating "I don't want to poop on myself."   Blood pressure 136/62, pulse 68, temperature 97.9 F (36.6 C), temperature source Oral, resp. rate 17, height 5\' 7"  (1.702 m), weight 87.3 kg (192 lb 7.4 oz), SpO2 95 %. Physical Exam  Constitutional: She is oriented to person, place, and time. She appears well-developed.  HENT:  Head: Normocephalic.     Eyes:  Dysconjugate gaze  Neck: Normal range of motion.  Cardiovascular: Normal rate.  Respiratory: Effort normal.  GI: Soft.  Musculoskeletal: She exhibits edema (1++ [name]).  Neurological: She is alert and oriented to person, place, and time.  Decreased LT up to right knee. UE 5/5. [name] 3-4/5. Able to lift LLE off bed  Skin:  Amputation site clean with staples, uncovered  Psychiatric: She has a normal mood and affect. Her behavior is normal.      Lab Results Last 24 Hours       Results for orders placed or performed during the hospital encounter of 04/24/18 (from the past 24 hour(s))  Glucose, capillary     Status: Abnormal    Collection Time: 05/13/18 11:36 AM  Result Value Ref Range    Glucose-Capillary 110 (H) 70 - 99 mg/dL  Glucose, capillary     Status: Abnormal    Collection  Time: 05/13/18  4:03 PM  Result Value Ref Range    Glucose-Capillary 57 (L) 70 - 99 mg/dL  Glucose, capillary     Status: Abnormal    Collection Time: 05/13/18  4:23 PM  Result Value Ref Range    Glucose-Capillary 131 (H) 70 - 99 mg/dL  Glucose, capillary     Status: Abnormal    Collection Time: 05/13/18  9:50 PM  Result Value Ref Range    Glucose-Capillary 158 (H) 70 - 99 mg/dL  CBC     Status: Abnormal    Collection Time: 05/14/18  6:28 AM  Result Value Ref Range    WBC 8.4 4.0 - 10.5 K/uL    RBC 3.38 (L) 3.87 - 5.11 MIL/uL    Hemoglobin 8.7 (L) 12.0 - 15.0 g/dL    HCT 29.4 (L) 36.0 - 46.0 %    MCV 87.0 78.0 - 100.0 fL    MCH 25.7 (L) 26.0 - 34.0 pg    MCHC 29.6 (L) 30.0 - 36.0 g/dL    RDW 21.8 (H) 11.5 - 15.5 %    Platelets 232 150 - 400 K/uL  Renal function panel     Status: Abnormal  Collection Time: 05/14/18  6:28 AM  Result Value Ref Range    Sodium 139 135 - 145 mmol/L    Potassium 4.1 3.5 - 5.1 mmol/L    Chloride 103 98 - 111 mmol/L    CO2 26 22 - 32 mmol/L    Glucose, Bld 125 (H) 70 - 99 mg/dL    BUN 30 (H) 6 - 20 mg/dL    Creatinine, Ser 4.90 (H) 0.44 - 1.00 mg/dL    Calcium 8.2 (L) 8.9 - 10.3 mg/dL    Phosphorus 4.4 2.5 - 4.6 mg/dL    Albumin 1.9 (L) 3.5 - 5.0 g/dL    GFR calc non Af Amer 9 (L) >60 mL/min    GFR calc Af Amer 11 (L) >60 mL/min    Anion gap 10 5 - 15  Magnesium     Status: None    Collection Time: 05/14/18  6:28 AM  Result Value Ref Range    Magnesium 1.8 1.7 - 2.4 mg/dL  Glucose, capillary     Status: Abnormal    Collection Time: 05/14/18  8:03 AM  Result Value Ref Range    Glucose-Capillary 123 (H) 70 - 99 mg/dL      Imaging Results (Last 48 hours)  No results found.       Assessment/Plan: Diagnosis: 52 yo female with PAD s/p left BKA, sepsis, subsequent deconditioning 1. Does the need for close, 24 hr/day medical supervision in concert with the patient's rehab needs make it unreasonable for this patient to be served in a less  intensive setting? Yes 2. Co-Morbidities requiring supervision/potential complications: DM2 with DPN, PAF,  3. Due to bladder management, bowel management, safety, skin/wound care, disease management, medication administration, pain management and patient education, does the patient require 24 hr/day rehab nursing? Yes 4. Does the patient require coordinated care of a physician, rehab nurse, PT (1-2 hrs/day, 5 days/week) and OT (1-2 hrs/day, 5 days/week) to address physical and functional deficits in the context of the above medical diagnosis(es)? Yes Addressing deficits in the following areas: balance, endurance, locomotion, strength, transferring, bowel/bladder control, bathing, dressing, feeding, grooming, toileting and psychosocial support 5. Can the patient actively participate in an intensive therapy program of at least 3 hrs of therapy per day at least 5 days per week? Yes 6. The potential for patient to make measurable gains while on inpatient rehab is excellent 7. Anticipated functional outcomes upon discharge from inpatient rehab are modified independent  with PT, modified independent with OT, n/a with SLP. 8. Estimated rehab length of stay to reach the above functional goals is: 9-14 days 9. Anticipated D/C setting: Home 10. Anticipated post D/C treatments: HH therapy and Outpatient therapy 11. Overall Rehab/Functional Prognosis: excellent   RECOMMENDATIONS: This patient's condition is appropriate for continued rehabilitative care in the following setting: CIR Patient has agreed to participate in recommended program. Yes Note that insurance prior authorization may be required for reimbursement for recommended care.   Comment: Rehab Admissions Coordinator to follow up.   Thanks,   Meredith Staggers, MD, Mellody Drown   I have personally performed a face to face diagnostic evaluation of this patient. Additionally, I have reviewed and concur with the physician assistant's documentation  above.       Lavon Paganini Angiulli, PA-C 05/14/2018          Revision History                       Routing History

## 2018-05-22 NOTE — Progress Notes (Signed)
Inpatient Rehabilitation-Admissions Coordinator   [facility] has received both medical and insurance approval for admit to CIR today. Floor RN, CM, and SW notified of plan. Please call if questions.   Jhonnie Garner, OTR/L  Rehab Admissions Coordinator  [phone] 05/22/2018 12:49 PM

## 2018-05-22 NOTE — Clinical Social Work Note (Signed)
Patient has orders and insurance approval to discharge to CIR today.  CSW signing off.  Dayton Scrape, [place]

## 2018-05-22 NOTE — Progress Notes (Signed)
PMR Admission Coordinator Pre-Admission Assessment  Patient: [name] is an 52 y.o., female MRN: [id number] DOB: 1966/08/04 Height: 5' 7" (170.2 cm) Weight: 74.8 kg (164 lb 14.5 oz)                                                                                                                                                  Insurance Information HMO:     PPO: Yes     PCP:      IPA:      80/20:      OTHER:  PRIMARY: UHC Medicare (plan [facility]da [name])      Policy#: [id number]      Subscriber: Patient CM Name: [name]     Phone#: [phone] ext [phone]     Fax#: [phone] [facility] received [name] from [place] at [facility]. Pt is approved for 7 days (with admit date 05/22/18) and clinical updates due by 7/12 to [name] (f) [phone] Pre-Cert#: [id number]      Employer:  Benefits:  Phone #: NA     Name: online portal: [email or web address] Eff. Date: 11/18/17     Deduct: $0 (met $0)      Out of Pocket Max: $4,500 (met $4,500)     Life Max: NA CIR: $345/day for days 1-5, $0/day for days 6+      SNF: $0/day for days 1-20, $160/day for days 21-49, $0/day for days 50-100; 100 day SNF limit Outpatient: per necessity      Co-Pay: $40/visit Home Health: 100% per necessity      Co-Pay:  DME: 80%     Co-Pay: 20% Providers:   Medicaid Application Date:       Case Manager:  Disability Application Date:       Case Worker:   Emergency Contact Information         Contact Information    Name Relation Home Work [place] Spouse [phone]  [phone]   [name]   [phone]     Current Medical History  Patient Admitting Diagnosis: 52 yo female with PAD s/p left AKA, sepsis, subsequent deconditioning  History of Present Illness: [name] is a 52 year old right-handed female with history of atrial fibrillation on Xarelto, diabetes mellitus with peripheral vascular disease. Per chart review patient lives with spouse. Used a rolling walker  prior to admission. Patient with recent admission 03/27/2018 until 03/31/2018 for redo of left femoral to PTA bypass. Postoperatively at that time needed thrombectomy. Recent followed by vascular surgery with findings of occluded bypass graft. Presented 04/25/2018 with left leg pain and ischemic changes. 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 Oneida [name]. Hospital course pain management. Noted right buttocks chronic stage III pressure ulcer with dressing changes as directed with follow-up per wound care  nurse. Patient developed septic shock follow-up per critical care services. Findings of elevated troponin felt to be related to non-ST elevation MI in the setting of hypotension with cardiology follow-up initially placed on intravenous heparin. Patient developed atrial fibrillation 05/07/2018 maintained on amiodarone. Considerations were made for possible need for cardiac catheterization however currently on hold due to renal insufficiency.Echocardiogram with ejection fraction of 39% grade 2 diastolic dysfunction. Systolic function was moderately to severely reduced.AKI in the setting of septic shock consistent with ischemic ATN with CK of 1049. Initially maintained on intravenous fluids per renal services creatinine 4.90 hemodialysis was initiated 04/29/2018 with tunneledcatheter placed. Her last dialysis session was 05/09/2018 and latest creatinine 4.08.Currently on subcutaneous heparin for DVT prophylaxis. Await decision to resume Xarelto. Physical occupational therapy evaluations completed with recommendations of physical medicine rehab consult. Patient is to be admitted for a comprehensive rehab program on 05/22/18.    Past Medical History      Past Medical History:  Diagnosis Date  . Abnormal stress test    a. 02/2017 MV: large region of fixed perfusion defect in basal to mid inf, mid-dist inflat walls, EF 43%. No ischemia (EF 55-60%  by f/u echo).  . Arthritis   . Asthma   . Chronic back pain   . Coronary artery calcification seen on CT scan    a. 11/2017 CT Abd/Pelvis: Multi vessel coronary vascular Ca2+.  . Depression   . Diabetes mellitus   . Diabetic neuropathy ([place])   . Difficult intubation    DIFFICULT AIRWAY/FYI  . Family history of adverse reaction to anesthesia    mother had difficlty waking   . Femoral-popliteal bypass graft occlusion, left ([place]) 12/02/2017  . GERD (gastroesophageal reflux disease)   . History of echocardiogram    a. 03/2017 Echo: EF 55-60%, mild LVH, nl RV fxn.  . Hyperlipidemia   . 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 PAD (peripheral artery disease) ([place])   . Paroxysmal atrial fibrillation with rapid ventricular response ([place]) 12/02/2017   a. CHA2DS2VASc = 3-->Xarelto.  Marland Kitchen Ulcer    Foot    Family History  family history includes Coronary artery disease in her mother; Diabetes in her brother, father, maternal grandmother, mother, paternal grandfather, paternal grandmother, and sister; Heart attack in her mother; Heart disease in her father and mother; Hyperlipidemia in her mother; Hypertension in her brother, mother, and sister; Other in her mother; Peripheral vascular disease in her mother; Varicose Veins in her mother.  Prior Rehab/Hospitalizations:  Has the patient had major surgery during 100 days prior to admission? Yes  Current Medications   Current Facility-Administered Medications:  .  acetaminophen (TYLENOL) tablet 650 mg, 650 mg, Oral, Q6H PRN, Jamie Bossier, MD, 650 mg at 05/22/18 0300 .  albuterol (PROVENTIL) tablet 2 mg, 2 mg, Oral, TID PRN, [name], Jamie A, NP, 2 mg at 05/14/18 2215 .  [START ON 05/23/2018] amiodarone (PACERONE) tablet 200 mg, 200 mg, Oral, Daily, [name], Jamie Picket, MD .  aspirin chewable tablet 81 mg, 81 mg, Oral, Daily, Jamie Bossier, MD, 81 mg at 05/22/18 0836 .  Darbepoetin Alfa (ARANESP) injection  100 mcg, 100 mcg, Subcutaneous, Q Thu-1800, [name], Jamie Picket, MD, 100 mcg at 05/21/18 1755 .  diphenoxylate-atropine (LOMOTIL) 2.5-0.025 MG per tablet 2 tablet, 2 tablet, Oral, QID, Denton Brick, Courage, MD, 2 tablet at 05/22/18 0835 .  fentaNYL (SUBLIMAZE) injection 25 mcg, 25 mcg, Intravenous, Q4H PRN, Jamie Bossier, MD, 25 mcg at 05/08/18 0135 .  ferrous  sulfate tablet 325 mg, 325 mg, Oral, BID WC, Emokpae, Courage, MD, 325 mg at 05/22/18 0835 .  [START ON 05/23/2018] furosemide (LASIX) tablet 80 mg, 80 mg, Oral, Daily, [name], Jamie Picket, MD .  heparin injection 5,000 Units, 5,000 Units, Subcutaneous, Q8H, Roxan Hockey, MD, 5,000 Units at 05/22/18 [phone] .  insulin aspart (novoLOG) injection 0-5 Units, 0-5 Units, Subcutaneous, QHS, Jamie Bossier, MD, 2 Units at 05/03/18 2201 .  insulin aspart (novoLOG) injection 0-9 Units, 0-9 Units, Subcutaneous, TID WC, Jamie Bossier, MD, 2 Units at 05/22/18 [phone] .  loperamide (IMODIUM) capsule 2 mg, 2 mg, Oral, PRN, Vance Gather B, MD .  metoprolol tartrate (LOPRESSOR) tablet 12.5 mg, 12.5 mg, Oral, BID, Emokpae, Courage, MD, 12.5 mg at 05/22/18 0836 .  multivitamin (RENA-VIT) tablet 1 tablet, 1 tablet, Oral, QHS, Jamie Bossier, MD, 1 tablet at 05/21/18 2147 .  nitroGLYCERIN (NITROSTAT) SL tablet 0.4 mg, 0.4 mg, Sublingual, Q5 min PRN, Jamie Bossier, MD .  ondansetron [facility]) injection 4 mg, 4 mg, Intravenous, Q6H PRN, Patrecia Pour, MD, 4 mg at 05/18/18 0836 .  oxyCODONE (Oxy IR/ROXICODONE) immediate release tablet 5 mg, 5 mg, Oral, Q6H PRN, Jamie Bossier, MD, 5 mg at 05/22/18 0842 .  pantoprazole (PROTONIX) EC tablet 40 mg, 40 mg, Oral, BID AC, Emokpae, Courage, MD, 40 mg at 05/22/18 0624 .  phenol (CHLORASEPTIC) mouth spray 1 spray, 1 spray, Mouth/Throat, PRN, Jamie Bossier, MD, 1 spray at 05/06/18 [phone] .  promethazine (PHENERGAN) injection 25 mg, 25 mg, Intravenous, Q8H PRN, Eulas Post, Hao, PA, 25 mg at 05/17/18 1350 .  rosuvastatin  (CRESTOR) tablet 20 mg, 20 mg, Oral, q1800, Jamie Bossier, MD, 20 mg at 05/21/18 1755 .  sodium chloride flush (NS) 0.9 % injection 10-40 mL, 10-40 mL, Intracatheter, Q12H, Jamie Bossier, MD, 10 mL at 05/21/18 2149 .  sodium chloride flush (NS) 0.9 % injection 10-40 mL, 10-40 mL, Intracatheter, PRN, Jamie Bossier, MD  Patients Current Diet:       Diet Order           Diet - low sodium heart healthy        DIET DYS 3 Room service appropriate? 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 used Rollator (has RW and cane as well)  Prior Functional Level Prior Function Level of Independence: Independent with assistive device(s) Comments: Pt reporting she was using a rollator PTA. Reports she performed ADLs.  Self Care: Did the patient need help bathing, dressing, using the toilet or eating?  Independent  Indoor Mobility: Did the patient need assistance with walking from room to room (with or without device)? Independent  Stairs: Did the patient need assistance with internal or external  stairs (with or without device)?  Independent  Functional Cognition: Did the patient need help planning regular tasks such as shopping or remembering to take medications? Independent  Current Functional Level Cognition  Overall Cognitive Status: No family/caregiver present to determine baseline cognitive functioning Orientation Level: Oriented X4 General Comments: Pt fearful with mobility, but willing to try.    Extremity Assessment (includes Sensation/Coordination)  Upper Extremity Assessment: Overall WFL for tasks assessed  Lower Extremity Assessment: Defer to PT evaluation [name] Deficits / Details: strength was 4- to 4+ [name] Coordination: decreased fine motor, decreased gross motor LLE Deficits / Details: s/p left AKA on 04/26/18.  LLE: Unable to fully assess due to pain LLE Coordination: decreased gross motor    ADLs  Overall ADL's : Needs assistance/impaired Eating/Feeding: Set up, Sitting Eating/Feeding Details (indicate cue type and reason): Set up with breakfast sitting at EOB at end of session Grooming: Set up, Supervision/safety, Sitting, Wash/dry face Grooming Details (indicate cue type and reason): washing face while sitting in recliner Upper Body Bathing: Minimal assistance, Sitting Lower Body Bathing: Maximal assistance, Sitting/lateral leans, Bed level Upper Body Dressing : Minimal assistance, Sitting Lower Body Dressing: Maximal assistance Lower Body Dressing Details (indicate cue type and reason): to don sock sitting EOB Toilet Transfer: Maximal assistance, +2 for physical assistance, Stand-pivot(Drop arm recliner) Toilet Transfer Details (indicate cue type and reason): Pt performing stand pivot transfer with Max A +2 to drop arm recliner. Pt requiring R knee blocked and use of bed pad to lift and manage hips Toileting- Clothing Manipulation and Hygiene: Maximal assistance, Bed level Toileting - Clothing Manipulation Details (indicate cue type and reason): Pt requiring Min A to manage LLE for  rolling to be placed on bed pad. Pt requiring Max A to performing toilet hygiene to clean up after BM in bed before placing on bed pan. Functional mobility during ADLs: Maximal assistance, +2 for physical assistance(stand pivot only to drop arm recliner) General ADL Comments: Max assist +2 for sit to stand at EOB with use of Stedy; pt appears fearful of falling--requests to sit back down quickly. Requested Microbiologist ordered.    Mobility  Overal bed mobility: Needs Assistance Bed Mobility: Rolling, Supine to Sit Rolling: Min assist Supine to sit: Min assist, +2 for physical assistance Sit to supine: Min guard, Min assist General bed mobility comments: min A to roll for ther ex, with heavy use of bed rails. Min A +2 to progress into sitting EOB and scoot hips.     Transfers  Overall transfer level: Needs assistance Equipment used: 1 person hand held assist Transfer via Lift Equipment: Stedy Transfers: Sit to/from Stand Sit to Stand: Max assist, +2 physical assistance, From elevated surface Stand pivot transfers: Mod assist  Lateral/Scoot Transfers: Mod assist General transfer comment: Utalized stedy for sit<>stand transfer. Pt very anxious about standing but agreeable to try. Cues provided for technique and hand placement. First attempt, pt unable to clear buttocks off EOB. Second attempt, utalized bed pad for lift assist and elevated bed. Pt able to rise into standing, however quickly returned to a seated position secondary to fear.    Ambulation / Gait / Stairs / Wheelchair Mobility  Ambulation/Gait General Gait Details: unable    Posture / Balance Dynamic Sitting Balance Sitting balance - Comments: Able to maintain sitting EOB with single LE support Balance Overall balance assessment: Needs assistance Sitting-balance support: Feet supported, No upper extremity supported Sitting balance-Leahy Scale: Fair Sitting balance - Comments: Able to maintain sitting EOB  with  single LE support Standing balance support: Bilateral upper extremity supported, During functional activity Standing balance-Leahy Scale: Zero Standing balance comment: max A to rise into standing with stedy    Special needs/care consideration BiPAP/CPAP: No CPM: No Continuous Drip IV: No Dialysis: was on intermittent HD due to AKI; last HD date 05/09/18; TDC removed; no plan for further dialysis  Life Vest: no Oxygen: No Special Bed: No  Trach Size: No Wound Vac (area): No      Location: NA Skin: Has history of old R buttocks stage 3 wound; now documented in flowsheet at Stage 2; L AKA surgical wound                              Bowel mgmt:Last BM 05/21/18; Continent  Bladder mgmt:continent Diabetic mgmt: Yes (per pt report, use of injections).      Previous Home Environment Home Care Services: No  Discharge Living Setting Plans for Discharge Living Setting: Patient's home, Lives with (comment)(lives with husband and father in law) Type of Home at Discharge: House Discharge Home Layout: One level Discharge Home Access: [place] entrance Discharge Bathroom Shower/Tub: Walk-in shower Discharge Bathroom Toilet: Handicapped height(via BSC frame) Discharge Bathroom Accessibility: Yes How Accessible: Accessible via wheelchair, Accessible via walker Does the patient have any problems obtaining your medications?: No  Social/Family/Support Systems Patient Roles: Spouse Contact Information: Husband [name]) Anticipated Caregiver: husband and father in law Anticipated Caregiver's Contact Information: Husband: cell [phone]; home: [phone] Ability/Limitations of Caregiver: both husband and father in law in good phsyical condiiton Caregiver Availability: 24/7 Discharge Plan Discussed with Primary Caregiver: Yes Is Caregiver In Agreement with Plan?: Yes Does Caregiver/Family have Issues with Lodging/Transportation while Pt is in Rehab?: No   Goals/Additional  Needs Patient/Family Goal for Rehab: PT/OT: Mod I; SLP: NA Expected length of stay: 9-14 days Cultural Considerations: NA Dietary Needs: DYS 3, thin; Of note listed allergies: Lactose Intolerance Equipment Needs: TBD Special Service Needs: NA Additional Information: NA Pt/Family Agrees to Admission and willing to participate: Yes Program Orientation Provided & Reviewed with Pt/Caregiver Including Roles  & Responsibilities: Yes(pt)  Barriers to Discharge: Weight bearing restrictions   Decrease burden of Care through IP rehab admission: NA   Possible need for SNF placement upon discharge:: Potentially   Patient Condition: This patient's medical and functional status has changed since the consult dated: 05/14/18 in which the Rehabilitation Physician determined and documented that the patient's condition is appropriate for intensive rehabilitative care in an inpatient rehabilitation facility. See "History of Present Illness" (above) for medical update. Functional changes are: Mod A for stand pivot transfers and Max A x2 for sit to stand transfers with [facility]. Patient's medical and functional status update has been discussed with the Rehabilitation physician and patient remains appropriate for inpatient rehabilitation. Will admit to inpatient rehab today.  Preadmission Screen Completed By:  Jhonnie Garner, 05/22/2018 12:19 PM ______________________________________________________________________   Discussed status with Dr. Posey Pronto on 05/22/18 at 12:20PM and received telephone approval for admission today.  Admission Coordinator:  Jhonnie Garner, time 12:20PM/Date 05/22/18.         Revision History    Date/Time User Provider Type Action  05/22/2018 12:38 PM Jamse Arn, MD Physician Addend  05/22/2018 12:38 PM Jamse Arn, MD Physician Cosign  05/22/2018 12:21 PM Jhonnie Garner, [place] [facility] Admission Coordinator Sign  View Details Report

## 2018-05-22 NOTE — Progress Notes (Signed)
Report given to 4W. IV and telemetry removed. Pt and husband pack up belongings. NT and nursing student at bedside to transfer pt via bed.

## 2018-05-22 NOTE — Consult Note (Signed)
[facility] Nurse wound consult note Reason for Consult: Consult requested for ischium and buttocks Wound type: Right ischium with chronic stage 3 pressure injury; pt states it has greatly decreased in size and depth. Pressure Injury POA: Yes Measurement: .3X1X.3cm, red and moist, no odor, mod amt yellow drainage. Bilat buttocks with red moist skin and slightly macerated; appearance is consistent with moisture associated skin damage.  Inner gluteal fold with partial thickness fissure related to moisture, NOT a pressure injury; approx 1X.1X.1cm Dressing procedure/placement/frequency: Barrier cream and antifungal powder to promote healing to buttocks and gluteal fold.  Aquacel to right ischium to absorb drainage and provide antimicrobial benefits to pressure injury. Discussed plan of care with patient. Please re-consult if further assistance is needed.  Thank-you,  Julien Girt MSN, [place], [place], [place], [place]

## 2018-05-22 NOTE — H&P (Signed)
Physical Medicine and Rehabilitation Admission H&P    Chief Complaint  Patient presents with  . Leg Pain  : HPI: [name] is a 52 year old right-handed female with history of atrial fibrillation on Xarelto, diabetes mellitus with peripheral vascular disease.  Per chart review and patient, patient lives with spouse.  Used a rolling walker prior to admission.  Husband works during the day.  Patient with recent admission 03/27/2018 until 03/31/2018 for redo of left femoral to PTA bypass.  Postoperatively at that time needed thrombectomy.  Recent followed by vascular surgery with findings of occluded bypass graft.  Presented 04/25/2018 with left leg pain and ischemic changes.  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.  Hospital course pain management.  Noted right buttocks chronic stage III pressure ulcer with dressing changes as directed with follow-up per wound care nurse.  Patient developed septic shock follow-up per critical care services.  Findings of elevated troponin felt to be related to non-ST elevation MI in the setting of hypotension with cardiology follow-up initially placed on intravenous heparin.  Patient developed atrial fibrillation 05/07/2018 maintained on amiodarone.  Considerations were made for possible need for cardiac catheterization however currently on hold due to renal insufficiency.  Echocardiogram with ejection fraction of 19% grade 2 diastolic dysfunction.  Systolic function was moderately to severely reduced.  AKI in the setting of septic shock consistent with ischemic ATN with CK of 1049.  Initially maintained on intravenous fluids per renal services creatinine 4.90 hemodialysis was initiated 04/29/2018 with tunneled catheter placed.  Her last dialysis session was 05/09/2018 and latest creatinine 4.08.  Currently on subcutaneous heparin for DVT prophylaxis.  Await decision to resume Xarelto.  Physical occupational  therapy evaluations completed with recommendations of physical medicine rehab consult.  Patient was admitted for a comprehensive rehab program.  Review of Systems  Constitutional: Negative for chills and fever.  Eyes: Negative for blurred vision and double vision.  Respiratory: Negative for shortness of breath.   Cardiovascular: Positive for leg swelling. Negative for chest pain and palpitations.  Gastrointestinal: Positive for constipation. Negative for nausea and vomiting.       GERD  Genitourinary: Negative for dysuria, flank pain and hematuria.  Musculoskeletal: Positive for myalgias.  Skin: Negative for rash.  Neurological: Positive for focal weakness and weakness.  Psychiatric/Behavioral: Positive for depression.  All other systems reviewed and are negative.  Past Medical History:  Diagnosis Date  . Abnormal stress test    a. 02/2017 MV: large region of fixed perfusion defect in basal to mid inf, mid-dist inflat walls, EF 43%. No ischemia (EF 55-60% by f/u echo).  . Arthritis   . Asthma   . Chronic back pain   . Coronary artery calcification seen on CT scan    a. 11/2017 CT Abd/Pelvis: Multi vessel coronary vascular Ca2+.  . Depression   . Diabetes mellitus   . Diabetic neuropathy ([name])   . Difficult intubation    DIFFICULT AIRWAY/FYI  . Family history of adverse reaction to anesthesia    mother had difficlty waking   . Femoral-popliteal bypass graft occlusion, left ([place]) 12/02/2017  . GERD (gastroesophageal reflux disease)   . History of echocardiogram    a. 03/2017 Echo: EF 55-60%, mild LVH, nl RV fxn.  . Hyperlipidemia   . 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 PAD (peripheral artery disease) ([place])   . Paroxysmal atrial fibrillation with rapid ventricular response (  [place]) 12/02/2017   a. CHA2DS2VASc = 3-->Xarelto.  Marland Kitchen Ulcer    Foot   Past Surgical History:  Procedure Laterality Date  . ABDOMINAL AORTAGRAM  June 15, 2014  . ABDOMINAL AORTAGRAM N/A 06/15/2014     Procedure: ABDOMINAL Maxcine Ham;  Surgeon: Serafina Mitchell, MD;  Location: [facility] CATH LAB;  Service: Cardiovascular;  Laterality: N/A;  . ABDOMINAL AORTAGRAM N/A 11/22/2014   Procedure: ABDOMINAL AORTAGRAM;  Surgeon: Serafina Mitchell, MD;  Location: [facility] CATH LAB;  Service: Cardiovascular;  Laterality: N/A;  . ABDOMINAL AORTOGRAM W/LOWER EXTREMITY N/A 01/07/2017   Procedure: Abdominal Aortogram w/Lower Extremity;  Surgeon: Serafina Mitchell, MD;  Location: [place] CV LAB;  Service: Cardiovascular;  Laterality: N/A;  . ABDOMINAL AORTOGRAM W/LOWER EXTREMITY N/A 10/31/2017   Procedure: ABDOMINAL AORTOGRAM W/LOWER EXTREMITY;  Surgeon: Elam Dutch, MD;  Location: [place] CV LAB;  Service: Cardiovascular;  Laterality: N/A;  . ABDOMINAL AORTOGRAM W/LOWER EXTREMITY N/A 03/24/2018   Procedure: ABDOMINAL AORTOGRAM W/LOWER EXTREMITY;  Surgeon: Serafina Mitchell, MD;  Location: [place] CV LAB;  Service: Cardiovascular;  Laterality: N/A;  . AMPUTATION Left 04/26/2018   Procedure: AMPUTATION ABOVE KNEE;  Surgeon: Elam Dutch, MD;  Location: [place];  Service: Vascular;  Laterality: Left;  . AORTA - BILATERAL FEMORAL ARTERY BYPASS GRAFT N/A 11/28/2017   Procedure: AORTA BIFEMORAL BYPASS USING HEMASHIELD GOLD GRAFT & REIMPLANT IMA;  Surgeon: Serafina Mitchell, MD;  Location: [facility] OR;  Service: Vascular;  Laterality: N/A;  . AORTIC ARCH ANGIOGRAPHY N/A 10/31/2017   Procedure: AORTIC ARCH ANGIOGRAPHY;  Surgeon: Elam Dutch, MD;  Location: [place] CV LAB;  Service: Cardiovascular;  Laterality: N/A;  . APPLICATION OF WOUND VAC  11/28/2017   Procedure: APPLICATION OF WOUND VAC;  Surgeon: Serafina Mitchell, MD;  Location: [facility] OR;  Service: Vascular;;  . APPLICATION OF WOUND VAC Left 03/27/2018   Procedure: APPLICATION OF WOUND VAC LEFT GROIN USING PREVENA PLUS;  Surgeon: Serafina Mitchell, MD;  Location: [place];  Service: Vascular;  Laterality: Left;  . ARTERIAL BYPASS SURGERY   07/05/2010   Right Common  Femoral to below knee popliteal BPG  . BACK SURGERY     X's  2  . CARDIAC CATHETERIZATION    . CHOLECYSTECTOMY     Gall Bladder  . CYSTECTOMY Left    wrist  . EMBOLECTOMY Left 11/28/2017   Procedure: Left Lower Extremity Embolectomy, Left Tibial Peroneal Trunk Endarterectomy with Patch Angioplasty; Vein Harvest Small Saphenous Graft Left Lower Leg;  Surgeon: [name], MD;  Location: [place];  Service: Vascular;  Laterality: Left;  . FEMORAL-POPLITEAL BYPASS GRAFT Left 03/27/2018   Procedure: THROMBECTOMY OF LEFT FEMORAL TIBIAL BYPASS;  Surgeon: Serafina Mitchell, MD;  Location: [place];  Service: Vascular;  Laterality: Left;  . FEMORAL-TIBIAL BYPASS GRAFT Left 03/27/2018   Procedure: BYPASS GRAFT FEMORAL-TIBIAL ARTERY LEFT REDO USING CRYOPRESERVED SAPHENOUS VEIN 70cm;  Surgeon: Serafina Mitchell, MD;  Location[facility] OR;  Service: Vascular;  Laterality: Left;  . INTERCOSTAL NERVE BLOCK  November 2015  . INTRAOPERATIVE ARTERIOGRAM  11/28/2017   Procedure: INTRA OPERATIVE ARTERIOGRAM OF LEFT LEG;  Surgeon: Serafina Mitchell, MD;  Location: [place];  Service: Vascular;;  . INTRAOPERATIVE ARTERIOGRAM Left 03/27/2018   Procedure: INTRA OPERATIVE ARTERIOGRAM TIMES TWO;  Surgeon: Serafina Mitchell, MD;  Location: [place];  Service: Vascular;  Laterality: Left;  . IR FLUORO GUIDE CV LINE RIGHT  03/20/2017  . IR FLUORO GUIDE CV LINE  RIGHT  05/05/2018  . IR REMOVAL TUN CV CATH W/O FL  05/20/2018  . IR US GUIDE VASC ACCESS RIGHT  03/20/2017  . IR US GUIDE VASC ACCESS RIGHT  05/05/2018  . IRRIGATION AND DEBRIDEMENT BUTTOCKS Right 09/30/2016   Procedure: DEBRIDEMENT RIGHT  BUTTOCK WOUND;  Surgeon: Georganna Skeans, MD;  Location: [place];  Service: General;  Laterality: Right;  . left foot surgery    . PERIPHERAL VASCULAR CATHETERIZATION N/A 05/07/2016   Procedure: Abdominal Aortogram;  Surgeon: Serafina Mitchell, MD;  Location: [place] CV LAB;  Service: Cardiovascular;  Laterality: N/A;  . PERIPHERAL VASCULAR  CATHETERIZATION N/A 05/07/2016   Procedure: Lower Extremity Angiography;  Surgeon: Serafina Mitchell, MD;  Location: [place] CV LAB;  Service: Cardiovascular;  Laterality: N/A;  . PERIPHERAL VASCULAR CATHETERIZATION N/A 05/07/2016   Procedure: Aortic Arch Angiography;  Surgeon: Serafina Mitchell, MD;  Location: [place] CV LAB;  Service: Cardiovascular;  Laterality: N/A;  . PERIPHERAL VASCULAR CATHETERIZATION N/A 05/07/2016   Procedure: Upper Extremity Angiography;  Surgeon: Serafina Mitchell, MD;  Location: [place] CV LAB;  Service: Cardiovascular;  Laterality: N/A;  . PERIPHERAL VASCULAR CATHETERIZATION Right 05/07/2016   Procedure: Peripheral Vascular Balloon Angioplasty;  Surgeon: Serafina Mitchell, MD;  Location: [place] CV LAB;  Service: Cardiovascular;  Laterality: Right;  subclavian  . PERIPHERAL VASCULAR CATHETERIZATION Right 05/07/2016   Procedure: Peripheral Vascular Intervention;  Surgeon: Serafina Mitchell, MD;  Location: [place] CV LAB;  Service: Cardiovascular;  Laterality: Right;  External  Iliac  . SKIN GRAFT Right 2012   [name] by Dr. Nils Pyle- Right and Left Ankle  . SPINE SURGERY    . THROMBECTOMY FEMORAL ARTERY  11/28/2017   Procedure: THROMBECTOMY  & REVISION OF BILATERAL FEMORAL TO POPLETEAL ARTERIES;  Surgeon: Serafina Mitchell, MD;  Location: [facility] OR;  Service: Vascular;;  . TONSILLECTOMY     Family History  Problem Relation Age of Onset  . Coronary artery disease Mother   . Peripheral vascular disease Mother   . Heart disease Mother        Before age 55  . Other Mother        Venous insuffiency  . Diabetes Mother   . Hyperlipidemia Mother   . Hypertension Mother   . Varicose Veins Mother   . Heart attack Mother        before age 101  . Heart disease Father   . Diabetes Father   . Diabetes Maternal Grandmother   . Diabetes Paternal Grandmother   . Diabetes Paternal Grandfather   . Diabetes Sister   . Hypertension Sister   . Diabetes Brother   . Hypertension  Brother    Social History:  reports that she quit smoking about 15 months ago. Her smoking use included cigarettes. She quit after 30.00 years of use. She has never used smokeless tobacco. She reports that she does not drink alcohol or use drugs. Allergies:  Allergies  Allergen Reactions  . Lactose Intolerance (Gi)    Medications Prior to Admission  Medication Sig Dispense Refill  . acetaminophen (TYLENOL) 325 MG tablet Take 2 tablets (650 mg total) by mouth every 6 (six) hours as needed for mild pain, fever or headache. 20 tablet 0  . albuterol (PROVENTIL) 2 MG tablet Take 2 mg by mouth 3 (three) times daily as needed for shortness of breath.     Derrill Memo ON 05/23/2018] amiodarone (PACERONE) 200 MG tablet Take 1 tablet (200 mg total)  by mouth daily. 30 tablet 0  . aspirin EC 81 MG tablet Take 81 mg by mouth every evening.    . cetirizine (ZYRTEC) 10 MG tablet Take 10 mg by mouth daily.     . ferrous sulfate 325 (65 FE) MG tablet Take 1 tablet (325 mg total) by mouth 2 (two) times daily with a meal. 60 tablet 0  . [START ON 05/23/2018] furosemide (LASIX) 80 MG tablet Take 1 tablet (80 mg total) by mouth daily. 30 tablet 0  . insulin lispro (HUMALOG) 100 UNIT/ML injection For glucose 121-150 use one unit, for 151-200 use two units, for 201-250 use three units, for 251-300 use five units, for 301-350 use seven units, 351 or greater use 9 units. 10 mL 0  . loperamide (IMODIUM A-D) 2 MG tablet Take 12 mg by mouth daily as needed for diarrhea or loose stools.    . multivitamin (RENA-VIT) TABS tablet Take 1 tablet by mouth at bedtime. 30 tablet 0  . omeprazole (PRILOSEC) 20 MG capsule Take 20 mg by mouth daily.    . ondansetron (ZOFRAN-ODT) 4 MG disintegrating tablet DISSOLVE 1 TABLET IN MOUTH EVERY 8 HOURS AS NEEDED FOR NAUSEA OR VOMITING 20 tablet 0  . rosuvastatin (CRESTOR) 20 MG tablet Take 1 tablet (20 mg total) by mouth daily at 6 PM. 30 tablet 2    Drug Regimen Review Drug regimen was reviewed  and remains appropriate with no significant issues identified  Home: Home Living Family/patient expects to be discharged to:: Skilled nursing facility   Functional History: Prior Function Level of Independence: Independent with assistive device(s) Comments: Pt reporting she was using a rollator PTA. Reports she performed ADLs.  Functional Status:  Mobility: Bed Mobility Overal bed mobility: Needs Assistance Bed Mobility: Rolling, Supine to Sit Rolling: Min assist Supine to sit: Min assist, +2 for physical assistance Sit to supine: Min guard, Min assist General bed mobility comments: min A to roll for ther ex, with heavy use of bed rails. Min A +2 to progress into sitting EOB and scoot hips.  Transfers Overall transfer level: Needs assistance Equipment used: 1 person hand held assist Transfer via Lift Equipment: Stedy Transfers: Sit to/from Stand Sit to Stand: Max assist, +2 physical assistance, From elevated surface Stand pivot transfers: Mod assist  Lateral/Scoot Transfers: Mod assist General transfer comment: Utalized stedy for sit<>stand transfer. Pt very anxious about standing but agreeable to try. Cues provided for technique and hand placement. First attempt, pt unable to clear buttocks off EOB. Second attempt, utalized bed pad for lift assist and elevated bed. Pt able to rise into standing, however quickly returned to a seated position secondary to fear. Ambulation/Gait General Gait Details: unable    ADL: ADL Overall ADL's : Needs assistance/impaired Eating/Feeding: Set up, Sitting Eating/Feeding Details (indicate cue type and reason): Set up with breakfast sitting at EOB at end of session Grooming: Set up, Supervision/safety, Sitting, Wash/dry face Grooming Details (indicate cue type and reason): washing face while sitting in recliner Upper Body Bathing: Minimal assistance, Sitting Lower Body Bathing: Maximal assistance, Sitting/lateral leans, Bed level Upper Body  Dressing : Minimal assistance, Sitting Lower Body Dressing: Maximal assistance Lower Body Dressing Details (indicate cue type and reason): to don sock sitting EOB Toilet Transfer: Maximal assistance, +2 for physical assistance, Stand-pivot(Drop arm recliner) Toilet Transfer Details (indicate cue type and reason): Pt performing stand pivot transfer with Max A +2 to drop arm recliner. Pt requiring R knee blocked and use of bed pad to  lift and manage hips Toileting- Clothing Manipulation and Hygiene: Maximal assistance, Bed level Toileting - Clothing Manipulation Details (indicate cue type and reason): Pt requiring Min A to manage LLE for rolling to be placed on bed pad. Pt requiring Max A to performing toilet hygiene to clean up after BM in bed before placing on bed pan. Functional mobility during ADLs: Maximal assistance, +2 for physical assistance(stand pivot only to drop arm recliner) General ADL Comments: Max assist +2 for sit to stand at EOB with use of Stedy; pt appears fearful of falling--requests to sit back down quickly. Requested Microbiologist ordered.  Cognition: Cognition Overall Cognitive Status: No family/caregiver present to determine baseline cognitive functioning Orientation Level: Oriented X4 Cognition Arousal/Alertness: Awake/alert Behavior During Therapy: WFL for tasks assessed/performed Overall Cognitive Status: No family/caregiver present to determine baseline cognitive functioning General Comments: Pt fearful with mobility, but willing to try.  Physical Exam: Blood pressure (!) 119/51, pulse 70, temperature 97.9 F (36.6 C), temperature source Oral, resp. rate 16, height _0  (1.702 m), weight 74.8 kg (164 lb 14.5 oz), SpO2 98 %. Physical Exam  Vitals reviewed. Constitutional: She is oriented to person, place, and time. She appears well-developed and well-nourished.  HENT:  Head: Normocephalic and atraumatic.  Eyes: Right eye exhibits no discharge. Left eye  exhibits no discharge.  Pupils reactive to light Ambylopia  Neck: Normal range of motion. Neck supple. No thyromegaly present.  Cardiovascular: Normal rate and regular rhythm.  Respiratory: Effort normal and breath sounds normal. No respiratory distress.  GI: Soft. Bowel sounds are normal. She exhibits no distension.  Musculoskeletal:  Left stump and [name] edema  Neurological: She is alert and oriented to person, place, and time.  Motor:B/l UE 4+/5 proximal to distal LLE:HF 3+/5 (pain inhibition) [name]: HF 2/5, KE 3/5, ADF 4+/5 (some pain inhibition) Sensation diminished to light touch [name]  Skin:  Amputee site with staples c/d/i  Psychiatric: She has a normal mood and affect. Her behavior is normal.    Results for orders placed or performed during the hospital encounter of 04/24/18 (from the past 48 hour(s))  Glucose, capillary     Status: Abnormal   Collection Time: 05/20/18  9:15 PM  Result Value Ref Range   Glucose-Capillary 149 (H) 70 - 99 mg/dL  Magnesium     Status: None   Collection Time: 05/21/18  6:25 AM  Result Value Ref Range   Magnesium 1.8 1.7 - 2.4 mg/dL    Comment: Performed at [facility] [address] [place], [place] [address]  Renal function panel     Status: Abnormal   Collection Time: 05/21/18  6:25 AM  Result Value Ref Range   Sodium 139 135 - 145 mmol/L   Potassium 3.7 3.5 - 5.1 mmol/L   Chloride 97 (L) 98 - 111 mmol/L    Comment: Please note change in reference range.   CO2 33 (H) 22 - 32 mmol/L   Glucose, Bld 147 (H) 70 - 99 mg/dL    Comment: Please note change in reference range.   BUN 40 (H) 6 - 20 mg/dL    Comment: Please note change in reference range.   Creatinine, Ser 4.08 (H) 0.44 - 1.00 mg/dL   Calcium 8.6 (L) 8.9 - 10.3 mg/dL   Phosphorus 4.0 2.5 - 4.6 mg/dL   Albumin 2.3 (L) 3.5 - 5.0 g/dL   GFR calc non Af Amer 11 (L) >60 mL/min   GFR calc Af Amer 13 (L) >60  mL/min    Comment: (NOTE) The eGFR has been calculated using the CKD  EPI equation. This calculation has not been validated in all clinical situations. eGFR's persistently <60 mL/min signify possible Chronic Kidney Disease.    Anion gap 9 5 - 15    Comment: Performed at [facility] [address] [place], [place] [address]  Glucose, capillary     Status: Abnormal   Collection Time: 05/21/18  7:50 AM  Result Value Ref Range   Glucose-Capillary 137 (H) 70 - 99 mg/dL  Glucose, capillary     Status: Abnormal   Collection Time: 05/21/18 11:26 AM  Result Value Ref Range   Glucose-Capillary 148 (H) 70 - 99 mg/dL  Glucose, capillary     Status: Abnormal   Collection Time: 05/21/18  4:24 PM  Result Value Ref Range   Glucose-Capillary 150 (H) 70 - 99 mg/dL  Glucose, capillary     Status: Abnormal   Collection Time: 05/21/18  9:13 PM  Result Value Ref Range   Glucose-Capillary 152 (H) 70 - 99 mg/dL   Comment 1 Notify RN    Comment 2 Document in Chart   Renal function panel     Status: Abnormal   Collection Time: 05/22/18  5:36 AM  Result Value Ref Range   Sodium 138 135 - 145 mmol/L   Potassium 3.4 (L) 3.5 - 5.1 mmol/L   Chloride 97 (L) 98 - 111 mmol/L    Comment: Please note change in reference range.   CO2 30 22 - 32 mmol/L   Glucose, Bld 137 (H) 70 - 99 mg/dL    Comment: Please note change in reference range.   BUN 40 (H) 6 - 20 mg/dL    Comment: Please note change in reference range.   Creatinine, Ser 3.74 (H) 0.44 - 1.00 mg/dL   Calcium 8.4 (L) 8.9 - 10.3 mg/dL   Phosphorus 4.0 2.5 - 4.6 mg/dL   Albumin 2.4 (L) 3.5 - 5.0 g/dL   GFR calc non Af Amer 13 (L) >60 mL/min   GFR calc Af Amer 15 (L) >60 mL/min    Comment: (NOTE) The eGFR has been calculated using the CKD EPI equation. This calculation has not been validated in all clinical situations. eGFR's persistently <60 mL/min signify possible Chronic Kidney Disease.    Anion gap 11 5 - 15    Comment: Performed at [facility] [address], [place], [place] [address]    Magnesium     Status: Abnormal   Collection Time: 05/22/18  5:36 AM  Result Value Ref Range   Magnesium 1.6 (L) 1.7 - 2.4 mg/dL    Comment: Performed at [facility] [address] [place], [place] [address]  Glucose, capillary     Status: Abnormal   Collection Time: 05/22/18  8:17 AM  Result Value Ref Range   Glucose-Capillary 163 (H) 70 - 99 mg/dL   Comment 1 Notify RN    Comment 2 Document in Chart   Glucose, capillary     Status: Abnormal   Collection Time: 05/22/18 11:45 AM  Result Value Ref Range   Glucose-Capillary 160 (H) 70 - 99 mg/dL   Comment 1 Notify RN    Comment 2 Document in Chart    No results found.     Medical Problem List and Plan: 1.  Decreased functional mobility secondary to left AKA 11/18/5724 complicated by septic shock and AKI 2.  DVT Prophylaxis/Anticoagulation: Subcutaneous heparin 3.  Pain Management: Oxycodone as needed 4. Mood: Provide emotional support 5. Neuropsych: This patient is capable of making decisions on her own behalf. 6. Skin/Wound Care/sacral decubitus: Routine skin checks and follow-up per WOC for wound recommendations 7. Fluids/Electrolytes/Nutrition: Routine in and outs with follow-up chemistries 8.  AKI in setting of septic shock consistent with ischemic ATN.  Follow-up per renal services.  No indication for hemodialysis at this time.  Baseline creatinine was 0.9-1 9.  NSTEMI/atrial fibrillation with RVR.  Discussed plan for cardiac catheterization once renal function stabilized.  Presently on amiodarone 200 mg daily, Lopressor 12.5 mg twice daily.  No plan to resume Xarelto at this time 10.  Diastolic congestive heart failure.  Monitor for any signs of fluid overload 11.  Diabetes mellitus with peripheral neuropathy.  Latest hemoglobin A1c 7.2.  SSI.  Patient on Humalog 10 units 3 times daily, Lantus insulin 36 units twice daily prior to admission.  Resume as needed 12.  Acute on chronic anemia.  Ferrous sulfate 325 mg twice  daily.  Follow-up CBC 13.  Hyperlipidemia.  Crestor 14.  GERD.  Protonix  Post Admission Physician Evaluation: 1. Preadmission assessment reviewed and changes made below. 2. Functional deficits secondary  to left AKA. 3. Patient is admitted to receive collaborative, interdisciplinary care between the physiatrist, rehab nursing staff, and therapy team. 4. Patient's level of medical complexity and substantial therapy needs in context of that medical necessity cannot be provided at a lesser intensity of care such as a SNF. 5. Patient has experienced substantial functional loss from his/her baseline which was documented above under the "Functional History" and "Functional Status" headings.  Judging by the patient's diagnosis, physical exam, and functional history, the patient has potential for functional progress which will result in measurable gains while on inpatient rehab.  These gains will be of substantial and practical use upon discharge  in facilitating mobility and self-care at the household level. 6. Physiatrist will provide 24 hour management of medical needs as well as oversight of the therapy plan/treatment and provide guidance as appropriate regarding the interaction of the two. 7. 24 hour rehab nursing will assist with safety, skin/wound care, disease management, pain management and patient education  and help integrate therapy concepts, techniques,education, etc. 8. PT will assess and treat for/with: Lower extremity strength, range of motion, stamina, balance, functional mobility, safety, adaptive techniques and equipment, wound care, coping skills, pain control, pre-prosthetic education. Goals are: Mod I at wheelchair level. 9. OT will assess and treat for/with: ADL's, functional mobility, safety, upper extremity strength, adaptive techniques and equipment, wound mgt, ego support, and community reintegration.   Goals are: Mod I at wheelchair level. Therapy may proceed with showering this  patient. 10. Case Management and Social Worker will assess and treat for psychological issues and discharge planning. 11. Team conference will be held weekly to assess progress toward goals and to determine barriers to discharge. 12. Patient will receive at least 3 hours of therapy per day at least 5 days per week. 13. ELOS: 7-12 days.       14. Prognosis:  good  I have personally performed a face to face diagnostic evaluation, including, but not limited to relevant history and physical exam findings, of this patient and developed relevant assessment and plan.  Additionally, I have reviewed and concur with the physician assistant's documentation above.  Delice Lesch, MD, ABPMR Lavon Paganini Angiulli, PA-C 05/22/2018

## 2018-05-22 NOTE — IPOC Note (Signed)
[ Overall Plan of Care [facility]) Patient Details Name: [name] MRN: [id number] DOB: [birth year]  Admitting Diagnosis: Left AKA  Hospital Problems: Active Problems:   Above knee amputation status, left [facility])     Functional Problem List: Nursing    PT Balance, Behavior, Edema, Endurance, Nutrition, Pain, Safety, Sensory, Skin Integrity, Other (comment)  OT Balance, Behavior, Cognition, Endurance, Pain, Safety, Vision  SLP    TR         Basic ADL's: OT Grooming, Bathing, Dressing, Toileting     Advanced  ADL's: OT Simple Meal Preparation     Transfers: PT Bed Mobility, Bed to Chair, Car, [employer], Floor  OT Toilet     Locomotion: PT Ambulation, Emergency planning/management officer, Stairs     Additional Impairments: OT None  SLP        TR      Anticipated Outcomes Item Anticipated Outcome  Self Feeding no goal  Swallowing      Basic self-care  MOD I  Toileting  MOD I toileting; Set up bathing   Bathroom Transfers MOD I toilet  Bowel/Bladder     Transfers  Mod I with LRAD   Locomotion  Supervision assist atWC level.   Communication     Cognition     Pain  pain at or below level 5  Safety/Judgment  maintain safety with cues/supervision   Therapy Plan: PT Intensity: Minimum of 1-2 x/day ,45 to 90 minutes PT Frequency: 5 out of 7 days PT Duration Estimated Length of Stay: 9-12 days  OT Intensity: Minimum of 1-2 x/day, 45 to 90 minutes OT Frequency: 5 out of 7 days OT Duration/Estimated Length of Stay: 10-14      Team Interventions: Nursing Interventions Patient/Family Education, Disease Management/Prevention, Discharge Planning, Skin Care/Wound Management, Pain Management, Medication Management  PT interventions Ambulation/gait training, Balance/vestibular training, Cognitive remediation/compensation, Community reintegration, Discharge planning, Disease management/prevention, Functional mobility training, Psychosocial support, DME/adaptive equipment  instruction, Neuromuscular re-education, Pain management, Patient/family education, Skin care/wound management, Therapeutic Activities, Splinting/orthotics, Therapeutic Exercise, UE/LE Coordination activities, Stair training, UE/LE Strength taining/ROM, Visual/perceptual remediation/compensation, Wheelchair propulsion/positioning  OT Interventions Balance/vestibular training, Discharge planning, Pain management, Self Care/advanced ADL retraining, Therapeutic Activities, UE/LE Coordination activities, Cognitive remediation/compensation, Disease mangement/prevention, Functional mobility training, Patient/family education, Skin care/wound managment, Therapeutic Exercise, Visual/perceptual remediation/compensation, Academic librarian, Engineer, drilling, Psychosocial support, Splinting/orthotics, Neuromuscular re-education, UE/LE Strength taining/ROM, Wheelchair propulsion/positioning  SLP Interventions    TR Interventions    SW/CM Interventions     Barriers to Discharge MD  Medical stability, Wound care and Weight bearing restrictions  Nursing      PT Decreased caregiver support, Medical stability, Home environment access/layout, Wound Care, Lack of/limited family support    OT Home environment access/layout    SLP      SW       Team Discharge Planning: Destination: PT-Home ,OT- Home , SLP-  Projected Follow-up: PT-Home health PT, OT-  Home health OT, SLP-  Projected Equipment Needs: PT-To be determined, Wheelchair (measurements), Wheelchair cushion (measurements), OT- None recommended by OT, SLP-  Equipment Details: PT- , OT-  Patient/family involved in discharge planning: PT- Patient,  OT-Patient, SLP-   MD ELOS: 10-15 days. Medical Rehab Prognosis:  Good Assessment: 52 year old right-handed female with history of atrial fibrillation on Xarelto, diabetes mellitus with peripheral vascular disease.  Patient with recent admission 03/27/2018 until 03/31/2018 for redo of left  femoral to PTA bypass.  Postoperatively at that time needed thrombectomy.  Recent followed by vascular surgery with findings of  occluded bypass graft.  Presented 04/25/2018 with left leg pain and ischemic changes.  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.  Hospital course pain management.  Noted right buttocks chronic stage III pressure ulcer with dressing changes as directed with follow-up per wound care nurse.  Patient developed septic shock follow-up per critical care services.  Findings of elevated troponin felt to be related to non-ST elevation MI in the setting of hypotension with cardiology follow-up initially placed on intravenous heparin.  Patient developed atrial fibrillation 05/07/2018 maintained on amiodarone.  Considerations were made for possible need for cardiac catheterization however currently on hold due to renal insufficiency.  Echocardiogram with ejection fraction of 98% grade 2 diastolic dysfunction.  Systolic function was moderately to severely reduced.  AKI in the setting of septic shock consistent with ischemic ATN with CK of 1049.  Initially maintained on intravenous fluids per renal services creatinine 4.90 hemodialysis was initiated 04/29/2018 with tunneled catheter placed.  Patient with resulting functional deficits with mobility, transfers, endurance, and self-care.  Will set goals for Mod I with PT/OT.  See Team Conference Notes for weekly updates to the plan of care

## 2018-05-22 NOTE — Progress Notes (Signed)
Admit to unit, oriented to rehab, therapy schedule and plan of care. [name]

## 2018-05-22 NOTE — Discharge Summary (Signed)
Physician Discharge Summary  [name] XLK:[id number] DOB: 09-Sep-1966 DOA: 04/24/2018  PCP: [name], MD  Admit date: 04/24/2018 Discharge date: 05/22/2018  Admitted From: Home  Disposition:  SNF  Recommendations for Outpatient Follow-up and new medication changes:  1. Follow up with Dr. Lennox Grumbles in 7 days 2. Please follow renal panel in 3 days (July 8th, 2019). 3. Please follow daily weights and daily urine output.   Home Health: na   Equipment/Devices: na   Discharge Condition: stable  CODE STATUS: full  Diet recommendation: Heart healthy and diabetic prudent.   Brief/Interim Summary: 52 year old female who presented with sepsis syndrome. She does have the significant past medical history of type 2 diabetes mellitus, andatrial fibrillation.Patient developed acute altered mental status, left lower leg pain, emesis and diarrhea. She had a recent left femoral revascularization, (05/10-5/14), onJune 3thfollow-up appointment found her hypotensive and febrile with occlusion of bypass graft.She was advised to go to the emergency room, but she declined to get further assistance,then she had clindamycin prescribed.On her initial physical examination blood pressure 71/48,was refractory to IV fluids,and required vasopressors. She was lethargic, dry mucous membranes, irregular heart rate, lungs clear to auscultation, abdomen soft nontender, left lower extremity with edema, erythema and tenderness. Sodium 130, potassium 3.9, chloride 97, bicarb 19, glucose 147, BUN 60, creatinine 8.1, CK 1049, white count 24.7, hemoglobin 7.5, hematocrit 24.2, platelets 379.Chest x-ray with cardiomegaly and increased lung markings bilaterally. EKGwith sinus tachycardia with a left bundle branch block.  Patient was admitted to the intensive care unit with a working diagnosis of septic shock due to gangrene on left foot.  1.  Septic shock due to left foot gangrene.  Patient was admitted to the  intensive care unit, she had a central venous catheter placed and received vasopressors along with broad-spectrum antibiotic therapy and IV fluids.  She regained hemodynamic stability, and underwent left above-the-knee amputation.  She completed her antibiotic therapy during her hospitalization.  2.  Non-ST elevation myocardial infarction.  Likely due to demand ischemia related with septic shock.  Patient received 48 hours of IV heparin, managed conservatively, coronary angiography was deferred due to acute kidney injury.  She may need further ischemic work-up once kidney function stabilizes.  Further work-up with echocardiography showed left ventricular ejection fraction 30 to 35%, with diffuse hypokinesis and akinesis of the basal and mid anteroseptal, anterior and apical septal and anterior walls.  3.  Acute systolic heart failure decompensation.  Patient had significant drop on her LV systolic function 55 to 36% down to 30 to 35%.  Cannot completely rule out ischemic event.  Will need further ischemic work-up as an outpatient once kidney function stabilizes.  Patient tolerating well metoprolol 12.5 mg twice daily, holding ACE inhibitors and spironolactone due to significant decrease in GFR.  Will continue furosemide 80 mg daily to keep negative fluid balance.  Will recommend repeat echocardiography in 3 months.  4.  Acute kidney injury due to acute tubular necrosis due to septic shock.  Patient developed anuric acute kidney injury, she required hemodialysis that was started April 29, 2018, patient has shown recovery of kidney function, her last hemodialysis was May 09, 2018.  Patient has been on high doses of IV furosemide with good urine output. I will continue 80 mg of furosemide daily, follow-up kidney function in 3 days.  5.  Paroxysmal atrial fibrillation.  It was initially diagnosed January 2019 postoperatively, patient was on rivaroxaban for peripheral vascular disease, it was felt that she may  not need long-term anticoagulation if no recurrent events on an outpatient event monitor.  Unfortunately  she did not had outpatient event monitoring testing.  Patient developed anemia during hospitalization and anticoagulation was held.  Patient was placed on amiodarone.  She does have a high Chads Vasc score, but will hold anticoagulation for now, she will need close follow-up as an outpatient, she will be a good candidate for novel oral anticoagulants if improvement of kidney function. For now will continue antiplatelet therapy with aspirin.   6.  Multifactorial acute on chronic anemia.  Suspected acute blood loss, possible GI origin (upper).  Hemoccult positive.  Patient received 1 unit of packed red blood cells and she was started on Aranesp.  Consider outpatient GI work-up. For now will continue proton pump inhibitors.   7.  Type 2 diabetes mellitus.  Patient was placed on insulin sliding scale for glucose coverage and monitoring, capillary glucose have remained stable.  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)   PAD (peripheral artery disease) (HCC)   Pressure injury of skin   DM2 (diabetes mellitus, type 2) (HCC)   Acute kidney injury ([place])   Buttock wound, left, subsequent encounter   Paroxysmal atrial fibrillation with rapid ventricular response (HCC)   Lower limb ischemia   Microcytic anemia   Gangrene of lower extremity (HCC)   Acute renal failure (HCC)   Acute respiratory failure (HCC)   Sepsis (HCC)   Acute encephalopathy   Acute combined systolic and diastolic heart failure (HCC)   Non-ST elevation (NSTEMI) myocardial infarction [facility])    Discharge Instructions   Allergies as of 05/22/2018      Reactions   Lactose Intolerance (gi)       Medication List    STOP taking these medications   HUMALOG KWIKPEN 100 UNIT/ML KiwkPen Generic drug:  insulin lispro Replaced by:   insulin lispro 100 UNIT/ML injection   HYDROcodone-acetaminophen 10-325 MG tablet Commonly known as:  NORCO   LANTUS SOLOSTAR 100 UNIT/ML Solostar Pen Generic drug:  Insulin Glargine   metoprolol tartrate 25 MG tablet Commonly known as:  LOPRESSOR   pregabalin 100 MG capsule Commonly known as:  LYRICA   rivaroxaban 20 MG Tabs tablet Commonly known as:  XARELTO     TAKE these medications   acetaminophen 325 MG tablet Commonly known as:  TYLENOL Take 2 tablets (650 mg total) by mouth every 6 (six) hours as needed for mild pain, fever or headache.   albuterol 2 MG tablet Commonly known as:  PROVENTIL Take 2 mg by mouth 3 (three) times daily as needed for shortness of breath.   amiodarone 200 MG tablet Commonly known as:  PACERONE Take 1 tablet (200 mg total) by mouth daily. Start taking on:  05/23/2018   aspirin EC 81 MG tablet Take 81 mg by mouth every evening.   cetirizine 10 MG tablet Commonly known as:  ZYRTEC Take 10 mg by mouth daily.   ferrous sulfate 325 (65 FE) MG tablet Take 1 tablet (325 mg total) by mouth 2 (two) times daily with a meal.   furosemide 80 MG tablet Commonly known as:  LASIX Take 1 tablet (80 mg total) by mouth daily. Start taking on:  05/23/2018   insulin lispro 100 UNIT/ML injection Commonly known as:  HUMALOG For glucose 121-150 use one unit, for 151-200 use two units, for 201-250 use three units, for 251-300 use  five units, for 301-350 use seven units, 351 or greater use 9 units. Replaces:  HUMALOG KWIKPEN 100 UNIT/ML KiwkPen   loperamide 2 MG tablet Commonly known as:  IMODIUM A-D Take 12 mg by mouth daily as needed for diarrhea or loose stools.   multivitamin Tabs tablet Take 1 tablet by mouth at bedtime.   omeprazole 20 MG capsule Commonly known as:  PRILOSEC Take 20 mg by mouth daily.   ondansetron 4 MG disintegrating tablet Commonly known as:  ZOFRAN-ODT DISSOLVE 1 TABLET IN MOUTH EVERY 8 HOURS AS NEEDED FOR NAUSEA OR  VOMITING   rosuvastatin 20 MG tablet Commonly known as:  CRESTOR Take 1 tablet (20 mg total) by mouth daily at 6 PM.      Contact information for after-discharge care    Destination    [facility] .   Service:  Skilled Nursing Contact information: [place] [phone]             Allergies  Allergen Reactions  . Lactose Intolerance (Gi)     Consultations:  Cardiology  Nephrology   Vascular surgery    Procedures/Studies: US Renal  Result Date: 04/25/2018 CLINICAL DATA:  Initial evaluation for acute renal injury. EXAM: RENAL / URINARY TRACT ULTRASOUND COMPLETE COMPARISON:  Prior CT from 11/30/2017 FINDINGS: Right Kidney: Length: 14.4 cm. Echogenicity within normal limits. No mass or hydronephrosis visualized. Left Kidney: Length: 13.7 cm. Echogenicity within normal limits. No mass or hydronephrosis visualized. Bladder: Bladder decompressed with a Foley catheter in place. IMPRESSION: 1. Negative renal ultrasound.  No hydronephrosis. 2. Bladder decompressed with a Foley catheter in place. Electronically Signed   By: Jeannine Boga M.D.   On: 04/25/2018 06:55   Ir Fluoro Guide Cv Line Right  Result Date: 05/05/2018 INDICATION: Acute kidney injury, no current access for dialysis EXAM: ULTRASOUND GUIDANCE FOR VASCULAR ACCESS RIGHT INTERNAL JUGULAR PERMANENT HEMODIALYSIS CATHETER Date:  05/05/2018 05/05/2018 3:15 pm Radiologist:  Jerilynn Mages. Daryll Brod, MD Guidance:  Ultrasound fluoroscopic FLUOROSCOPY TIME:  Fluoroscopy Time: 0 minutes 24 seconds (2 mGy). MEDICATIONS: Ancef 2 g administered within 1 hour of procedure ANESTHESIA/SEDATION: Versed 0.5 mg IV; Fentanyl 12.5 mcg IV; Moderate Sedation Time:  18 minutes The patient was continuously monitored during the procedure by the interventional radiology nurse under my direct supervision. CONTRAST:  None. COMPLICATIONS: None immediate. PROCEDURE: Informed consent was obtained from  the patient following explanation of the procedure, risks, benefits and alternatives. The patient understands, agrees and consents for the procedure. All questions were addressed. A time out was performed. Maximal barrier sterile technique utilized including caps, mask, sterile gowns, sterile gloves, large sterile drape, hand hygiene, and 2% chlorhexidine scrub. Under sterile conditions and local anesthesia, right internal jugular micropuncture venous access was performed with ultrasound. Images were obtained for documentation of the patent right internal jugular vein. A guide wire was inserted followed by a transitional dilator. Next, a 0.035 guidewire was advanced into the IVC with a 5-French catheter. Measurements were obtained from the right venotomy site to the proximal right atrium. In the right infraclavicular chest, a subcutaneous tunnel was created under sterile conditions and local anesthesia. 1% lidocaine with epinephrine was utilized for this. The 19 cm tip to cuff dialysis catheter was tunneled subcutaneously to the venotomy site and inserted into the SVC/RA junction through a valved peel-away sheath. Position was confirmed with fluoroscopy. Images were obtained for documentation. Blood was aspirated from the catheter followed by saline and heparin flushes. The appropriate  volume and strength of heparin was instilled in each lumen. Caps were applied. The catheter was secured at the tunnel site with Gelfoam and a pursestring suture. The venotomy site was closed with subcuticular Vicryl suture. Dermabond was applied to the small right neck incision. A dry sterile dressing was applied. The catheter is ready for use. No immediate complications. IMPRESSION: Ultrasound and fluoroscopically guided right internal jugular tunneled hemodialysis catheter (19 cm tip to cuff dialysis catheter). Electronically Signed   By: Jerilynn Mages.  Shick M.D.   On: 05/05/2018 15:25   Ir Removal Tun Cv Cath W/o Fl  Result Date:  05/20/2018 INDICATION: Patient is status post tunneled dialysis catheter placement 05/05/18. Renal function has improved request no made for removal. EXAM: REMOVAL TUNNELED CENTRAL VENOUS CATHETER MEDICATIONS: None ANESTHESIA/SEDATION: None FLUOROSCOPY TIME:  None COMPLICATIONS: None immediate. PROCEDURE: Informed written consent was obtained from the patient after a thorough discussion of the procedural risks, benefits and alternatives. All questions were addressed. Maximal Sterile Barrier Technique was utilized including caps, mask, sterile gowns, sterile gloves, sterile drape, hand hygiene and skin antiseptic. A timeout was performed prior to the initiation of the procedure. The patient's right chest and catheter was prepped and draped in a normal sterile fashion. Heparin was removed from both ports of catheter. Using gentle traction the cuff was exposed and the catheter was removed in it's entirety. Pressure was held till hemostasis was obtained. A sterile dressing was applied. The patient tolerated the procedure well with no immediate complications. IMPRESSION: Successful catheter removal as described above. Read by: Brynda Greathouse PA-C Electronically Signed   By: Marybelle Killings M.D.   On: 05/20/2018 16:47   Ir US Guide Vasc Access Right  Result Date: 05/05/2018 INDICATION: Acute kidney injury, no current access for dialysis EXAM: ULTRASOUND GUIDANCE FOR VASCULAR ACCESS RIGHT INTERNAL JUGULAR PERMANENT HEMODIALYSIS CATHETER Date:  05/05/2018 05/05/2018 3:15 pm Radiologist:  Jerilynn Mages. Daryll Brod, MD Guidance:  Ultrasound fluoroscopic FLUOROSCOPY TIME:  Fluoroscopy Time: 0 minutes 24 seconds (2 mGy). MEDICATIONS: Ancef 2 g administered within 1 hour of procedure ANESTHESIA/SEDATION: Versed 0.5 mg IV; Fentanyl 12.5 mcg IV; Moderate Sedation Time:  18 minutes The patient was continuously monitored during the procedure by the interventional radiology nurse under my direct supervision. CONTRAST:  None. COMPLICATIONS: None  immediate. PROCEDURE: Informed consent was obtained from the patient following explanation of the procedure, risks, benefits and alternatives. The patient understands, agrees and consents for the procedure. All questions were addressed. A time out was performed. Maximal barrier sterile technique utilized including caps, mask, sterile gowns, sterile gloves, large sterile drape, hand hygiene, and 2% chlorhexidine scrub. Under sterile conditions and local anesthesia, right internal jugular micropuncture venous access was performed with ultrasound. Images were obtained for documentation of the patent right internal jugular vein. A guide wire was inserted followed by a transitional dilator. Next, a 0.035 guidewire was advanced into the IVC with a 5-French catheter. Measurements were obtained from the right venotomy site to the proximal right atrium. In the right infraclavicular chest, a subcutaneous tunnel was created under sterile conditions and local anesthesia. 1% lidocaine with epinephrine was utilized for this. The 19 cm tip to cuff dialysis catheter was tunneled subcutaneously to the venotomy site and inserted into the SVC/RA junction through a valved peel-away sheath. Position was confirmed with fluoroscopy. Images were obtained for documentation. Blood was aspirated from the catheter followed by saline and heparin flushes. The appropriate volume and strength of heparin was instilled in each lumen. Caps were applied. The  catheter was secured at the tunnel site with Gelfoam and a pursestring suture. The venotomy site was closed with subcuticular Vicryl suture. Dermabond was applied to the small right neck incision. A dry sterile dressing was applied. The catheter is ready for use. No immediate complications. IMPRESSION: Ultrasound and fluoroscopically guided right internal jugular tunneled hemodialysis catheter (19 cm tip to cuff dialysis catheter). Electronically Signed   By: Jerilynn Mages.  Shick M.D.   On: 05/05/2018 15:25    Dg Chest Port 1 View  Result Date: 05/02/2018 CLINICAL DATA:  Pulmonary edema. EXAM: PORTABLE CHEST 1 VIEW COMPARISON:  05/01/2018. FINDINGS: Endotracheal tube and nasogastric tube have been removed. Right IJ central line tip projects over the SVC. Left IJ central line tip also projects over the SVC. Heart size stable. Mild diffuse interstitial prominence and indistinctness with improvement in aeration in the lung bases. Small bilateral pleural effusions. IMPRESSION: Improving congestive heart failure. Electronically Signed   By: Lorin Picket M.D.   On: 05/02/2018 07:18   Dg Chest Port 1 View  Result Date: 05/01/2018 CLINICAL DATA:  Respiratory failure. EXAM: PORTABLE CHEST 1 VIEW COMPARISON:  04/30/2018. FINDINGS: Endotracheal tube, NG tube, right IJ line stable position. Stable cardiomegaly persistent bibasilar infiltrates/edema and small bilateral pleural effusions. No interim change. No pneumothorax. IMPRESSION: 1.  Lines and tubes in stable position. 2. Persistent bibasilar infiltrates/edema and small bilateral pleural effusions. No significant interim change. 3.  Stable cardiomegaly. Electronically Signed   By: Marcello Moores  Register   On: 05/01/2018 06:25   Dg Chest Port 1 View  Result Date: 04/30/2018 CLINICAL DATA:  Intubation EXAM: PORTABLE CHEST 1 VIEW COMPARISON:  04/29/2018 FINDINGS: Endotracheal tube in good position. NG tube in the stomach. Central venous catheter tip in the lower SVC unchanged. No pneumothorax Cardiac enlargement. Diffuse bilateral airspace disease has progressed, probable edema. Progression of bibasilar atelectasis and small pleural effusions bilaterally. IMPRESSION: Support lines remain in good position Worsening bilateral airspace disease most consistent with pulmonary edema. Progression of bibasilar atelectasis and bilateral effusions. Electronically Signed   By: Franchot Gallo M.D.   On: 04/30/2018 07:16   Dg Chest Port 1 View  Result Date: 04/29/2018 CLINICAL  DATA:  Hypoxia EXAM: PORTABLE CHEST 1 VIEW COMPARISON:  April 28, 2018 FINDINGS: Endotracheal tube tip is 2.1 cm above the carina. Right central catheter tip is in the superior vena cava. Left central catheter tip is at the junction of the left innominate vein and superior vena cava, stable. Nasogastric tube tip and side port below the diaphragm. No pneumothorax. There is bibasilar atelectasis with small pleural effusion on the right. No consolidation. Heart is mildly enlarged with pulmonary vascularity normal. No adenopathy. No bone lesions. IMPRESSION: Tube and catheter positions as described without pneumothorax. Mild bibasilar atelectasis. Small right pleural effusion. No consolidation. Stable cardiac prominence. Electronically Signed   By: Lowella Grip III M.D.   On: 04/29/2018 07:34   Dg Chest Port 1 View  Result Date: 04/28/2018 CLINICAL DATA:  ETT placement and HD catheter placement EXAM: PORTABLE CHEST 1 VIEW COMPARISON:  04/27/2018. FINDINGS: Cardiomegaly. HD catheter tip distal SVC. ET tube tip 3.3 cm above carina. BILATERAL pulmonary opacities, RIGHT worse than LEFT, consistent with pulmonary edema. Central venous catheter tip unchanged from LEFT IJ approach, proximal SVC. No pneumothorax. IMPRESSION: Cardiomegaly with pulmonary edema may be minimally worse. ET tube and HD catheter appear in satisfactory position. No pneumothorax. Electronically Signed   By: Staci Righter M.D.   On: 04/28/2018 16:57   Dg  Chest Port 1 View  Result Date: 04/27/2018 CLINICAL DATA:  Acute respiratory failure EXAM: PORTABLE CHEST 1 VIEW COMPARISON:  04/25/2018 FINDINGS: Bilateral mild interstitial thickening with patchy alveolar airspace opacities at bilateral lung bases. No pleural effusion or pneumothorax. Stable cardiomediastinal silhouette. Left IJ central venous catheter with the tip projecting over the SVC in unchanged position. No acute osseous abnormality. IMPRESSION: 1. Bilateral interstitial thickening  concerning for mild interstitial edema versus interstitial infection. Electronically Signed   By: Kathreen Devoid   On: 04/27/2018 08:37   Dg Chest Port 1 View  Result Date: 04/25/2018 CLINICAL DATA:  Central line placement. EXAM: PORTABLE CHEST 1 VIEW COMPARISON:  Chest radiograph performed 04/24/2018 FINDINGS: The patient's left the IJ line is noted ending about the proximal SVC. Mild vascular congestion is noted. Mild bibasilar atelectasis is seen. No pleural effusion or pneumothorax is seen, though the lung bases are incompletely characterized. The cardiomediastinal silhouette is borderline normal in size. No acute osseous abnormalities are identified. IMPRESSION: 1. Left IJ line noted ending about the proximal SVC. No pneumothorax. 2. Mild vascular congestion noted; mild bibasilar atelectasis seen. Electronically Signed   By: Garald Balding M.D.   On: 04/25/2018 03:57   Dg Chest Portable 1 View  Result Date: 04/24/2018 CLINICAL DATA:  Initial evaluation for acute sepsis. EXAM: PORTABLE CHEST 1 VIEW COMPARISON:  Prior radiograph from 12/02/2017. FINDINGS: Moderate cardiomegaly, stable. Mediastinal silhouette within normal limits. No focal infiltrates. No pulmonary edema or visible pleural effusion. No pneumothorax. Osseous structures within normal limits. IMPRESSION: 1. No active cardiopulmonary disease. 2. Stable cardiomegaly without pulmonary edema. Electronically Signed   By: Jeannine Boga M.D.   On: 04/24/2018 22:19       Subjective: Patient is feeling better, had difficulty sleeping, edema has been improving, no nausea or vomiting ,no chest pain or dyspnea.   Discharge Exam: Vitals:   05/22/18 0435 05/22/18 0846  BP: (!) 110/57 (!) 119/51  Pulse: 66 70  Resp: 16 16  Temp: 97.6 F (36.4 C) 97.9 F (36.6 C)  SpO2: 96% 98%   Vitals:   05/21/18 2145 05/22/18 0435 05/22/18 0500 05/22/18 0846  BP: 120/67 (!) 110/57  (!) 119/51  Pulse: 62 66  70  Resp:  16  16  Temp:  97.6 F  (36.4 C)  97.9 F (36.6 C)  TempSrc:  Oral  Oral  SpO2:  96%  98%  Weight:   74.8 kg (164 lb 14.5 oz)   Height:        General: Not in pain or dyspnea, deconditioned  Neurology: Awake and alert, non focal  E ENT: mild pallor, no icterus, oral mucosa moist Cardiovascular: No JVD. S1-S2 present, rhythmic, no gallops, rubs, or murmurs. Right lower extremity edema ++/+++ pitting Pulmonary: positive breath sounds bilaterally, adequate air movement, no wheezing, rhonchi or rales. Gastrointestinal. Abdomen with no organomegaly, non tender, no rebound or guarding Skin. No rashes Musculoskeletal: no joint deformities   The results of significant diagnostics from this hospitalization (including imaging, microbiology, ancillary and laboratory) are listed below for reference.     Microbiology: No results found for this or any previous visit (from the past 240 hour(s)).   Labs: BNP (last 3 results) No results for input(s): BNP in the last 8760 hours. Basic Metabolic Panel: Recent Labs  Lab 05/18/18 0552 05/19/18 0519 05/20/18 0657 05/21/18 0625 05/22/18 0536  NA 139 138 141 139 138  K 3.7 3.6 3.7 3.7 3.4*  CL 101 97* 100 97* 97*  CO2 29 29 30  33* 30  GLUCOSE 100* 117* 143* 147* 137*  BUN 38* 38* 39* 40* 40*  CREATININE 5.12* 4.71* 4.49* 4.08* 3.74*  CALCIUM 8.2* 8.5* 8.3* 8.6* 8.4*  MG 1.8 1.6* 1.5* 1.8 1.6*  PHOS 4.9* 5.1* 4.5 4.0 4.0   Liver Function Tests: Recent Labs  Lab 05/18/18 0552 05/19/18 0519 05/20/18 0657 05/21/18 0625 05/22/18 0536  ALBUMIN 2.0* 2.2* 2.2* 2.3* 2.4*   No results for input(s): LIPASE, AMYLASE in the last 168 hours. No results for input(s): AMMONIA in the last 168 hours. CBC: Recent Labs  Lab 05/16/18 0500 05/17/18 0712  WBC 6.8 6.6  HGB 8.3* 9.0*  HCT 28.0* 30.6*  MCV 87.2 86.7  PLT 256 304   Cardiac Enzymes: No results for input(s): CKTOTAL, CKMB, CKMBINDEX, TROPONINI in the last 168 hours. BNP: Invalid input(s):  POCBNP CBG: Recent Labs  Lab 05/21/18 0750 05/21/18 1126 05/21/18 1624 05/21/18 2113 05/22/18 0817  GLUCAP 137* 148* 150* 152* 163*   D-Dimer No results for input(s): DDIMER in the last 72 hours. Hgb A1c No results for input(s): HGBA1C in the last 72 hours. Lipid Profile No results for input(s): CHOL, HDL, LDLCALC, TRIG, CHOLHDL, LDLDIRECT in the last 72 hours. Thyroid function studies No results for input(s): TSH, T4TOTAL, T3FREE, THYROIDAB in the last 72 hours.  Invalid input(s): FREET3 Anemia work up No results for input(s): VITAMINB12, FOLATE, FERRITIN, TIBC, IRON, RETICCTPCT in the last 72 hours. Urinalysis    Component Value Date/Time   COLORURINE YELLOW 04/25/2018 0407   APPEARANCEUR CLOUDY (A) 04/25/2018 0407   LABSPEC 1.012 04/25/2018 0407   PHURINE 5.0 04/25/2018 0407   GLUCOSEU NEGATIVE 04/25/2018 0407   HGBUR MODERATE (A) 04/25/2018 0407   BILIRUBINUR NEGATIVE 04/25/2018 0407   KETONESUR NEGATIVE 04/25/2018 0407   PROTEINUR 100 (A) 04/25/2018 0407   UROBILINOGEN 0.2 06/25/2011 1707   NITRITE NEGATIVE 04/25/2018 0407   LEUKOCYTESUR NEGATIVE 04/25/2018 0407   Sepsis Labs Invalid input(s): PROCALCITONIN,  WBC,  LACTICIDVEN Microbiology No results found for this or any previous visit (from the past 240 hour(s)).   Time coordinating discharge: 45 minutes  SIGNED:   Tawni Millers, MD  [name] 05/22/2018, 8:51 AM Pager [phone]  If 7PM-7AM, please contact night-coverage [email or web address] Password [email or web address]

## 2018-05-23 ENCOUNTER — Inpatient Hospital Stay (HOSPITAL_COMMUNITY): Payer: Medicare Other

## 2018-05-23 ENCOUNTER — Inpatient Hospital Stay (HOSPITAL_COMMUNITY): Payer: Medicare Other | Admitting: Physical Therapy

## 2018-05-23 LAB — GLUCOSE, CAPILLARY
GLUCOSE-CAPILLARY: 141 mg/dL — AB (ref 70–99)
GLUCOSE-CAPILLARY: 178 mg/dL — AB (ref 70–99)
GLUCOSE-CAPILLARY: 143 mg/dL — AB (ref 70–99)
Glucose-Capillary: 172 mg/dL — ABNORMAL HIGH (ref 70–99)

## 2018-05-23 MED ORDER — HYDROCODONE-ACETAMINOPHEN 5-325 MG PO TABS
1.0000 | ORAL_TABLET | Freq: Two times a day (BID) | ORAL | Status: DC
  Administered 2018-05-23 – 2018-06-03 (×23): 1 via ORAL
  Filled 2018-05-23 (×23): qty 1

## 2018-05-23 NOTE — Evaluation (Signed)
Occupational Therapy Assessment and Plan  Patient Details  Name: [name] MRN: [id number] Date of Birth: 07/19/1966  OT Diagnosis: cognitive deficits, disturbance of vision, lumbago (low back pain) and muscle weakness (generalized) Rehab Potential:   ELOS: 10-14   Session 1: 60 min (0277-4128)  Session 2: 75 min (1330-1430)  Problem List:  Patient Active Problem List   Diagnosis Date Noted  . Above knee amputation status, left ([place]) 05/22/2018  . AKI (acute kidney injury) ([place])   . Unilateral AKA, left ([place])   . ATN (acute tubular necrosis) ([place])   . Atrial fibrillation with rapid ventricular response ([place])   . Diabetic peripheral neuropathy ([place])   . Acute blood loss anemia   . Anemia of chronic disease   . Hyperlipidemia   . Gastroesophageal reflux disease   . Non-ST elevation (NSTEMI) myocardial infarction ([place])   . Acute combined systolic and diastolic heart failure ([place])   . Acute encephalopathy   . Acute renal failure ([place])   . Acute respiratory failure ([place])   . Sepsis ([place])   . Lower limb ischemia 04/25/2018  . Microcytic anemia 04/25/2018  . Pulmonary nodule 04/25/2018  . Gangrene of lower extremity ([place]) 04/25/2018  . Femoral-popliteal bypass graft occlusion, left ([place]) 12/02/2017  . Respiratory failure with hypoxia ([place]) 12/02/2017  . Leukocytosis 12/02/2017  . Paroxysmal atrial fibrillation with rapid ventricular response ([place]) 12/02/2017  . Thrombosis 11/24/2017  . Hyperkalemia 06/26/2017  . Atherosclerosis of native artery of right lower extremity with ulceration of ankle ([place]) 05/13/2017  . Osteomyelitis of right fibula ([place]) 03/05/2017  . Non-pressure chronic ulcer of right ankle limited to breakdown of skin ([place]) 01/17/2017  . Elevated lactic acid level   . Osteomyelitis of ankle ([place]) 12/29/2016  . Chronic pain 12/24/2016  . Buttock wound, left, subsequent encounter   . Hypotension 12/23/2016  . IDDM (insulin dependent diabetes mellitus) ([place])  12/23/2016  . Acute kidney injury ([place]) 12/23/2016  . Malnutrition ([place]) 12/23/2016  . Thrush 12/23/2016  . Septic shock ([place]) 11/27/2016  . DM2 (diabetes mellitus, type 2) ([place]) 11/27/2016  . Chronic back pain 11/27/2016  . Orthostatic hypotension 11/27/2016  . Demand ischemia ([place])   . Hyperlipidemia LDL goal <70   . Right sided weakness   . Retinal tumor of right eye   . Pressure injury of skin 10/01/2016  . Necrotizing fasciitis ([place]) 09/30/2016  . 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 Paresthesia of both hands 03/05/2016  . DDD (degenerative disc disease), lumbosacral 03/26/2015  . DDD (degenerative disc disease), cervical 03/26/2015  . Sacroiliac joint disease 03/26/2015  . Occipital neuralgia 03/26/2015  . Pain in limb-Bilateral Leg 04/25/2014  . PAD (peripheral artery disease) ([place]) 04/05/2013  . Chronic total occlusion of artery of the extremities ([place]) 02/10/2012  . Peripheral vascular disease, unspecified ([place]) 02/10/2012    Past Medical History:  Past Medical History:  Diagnosis Date  . Abnormal stress test    a. 02/2017 MV: large region of fixed perfusion defect in basal to mid inf, mid-dist inflat walls, EF 43%. No ischemia (EF 55-60% by f/u echo).  . Arthritis   . Asthma   . Chronic back pain   . Coronary artery calcification seen on CT scan    a. 11/2017 CT Abd/Pelvis: Multi vessel coronary vascular Ca2+.  . Depression   . Diabetes mellitus   . Diabetic neuropathy ([place])   . Difficult intubation    DIFFICULT AIRWAY/FYI  . Family history of adverse reaction to  anesthesia    mother had difficlty waking   . Femoral-popliteal bypass graft occlusion, left ([place]) 12/02/2017  . GERD (gastroesophageal reflux disease)   . History of echocardiogram    a. 03/2017 Echo: EF 55-60%, mild LVH, nl RV fxn.  . Hyperlipidemia   . Osteomyelitis of right fibula ([place]) 03/05/2017  . PAD (peripheral artery disease) ([place])   . Paroxysmal atrial fibrillation with rapid  ventricular response ([place]) 12/02/2017   a. CHA2DS2VASc = 3-->Xarelto.  Marland Kitchen Ulcer    Foot   Past Surgical History:  Past Surgical History:  Procedure Laterality Date  . ABDOMINAL AORTAGRAM  June 15, 2014  . ABDOMINAL AORTAGRAM N/A 06/15/2014   Procedure: ABDOMINAL Maxcine Ham;  Surgeon: Serafina Mitchell, MD;  Location: [facility] CATH LAB;  Service: Cardiovascular;  Laterality: N/A;  . ABDOMINAL AORTAGRAM N/A 11/22/2014   Procedure: ABDOMINAL AORTAGRAM;  Surgeon: Serafina Mitchell, MD;  Location: [facility] CATH LAB;  Service: Cardiovascular;  Laterality: N/A;  . ABDOMINAL AORTOGRAM W/LOWER EXTREMITY N/A 01/07/2017   Procedure: Abdominal Aortogram w/Lower Extremity;  Surgeon: Serafina Mitchell, MD;  Location: [place] CV LAB;  Service: Cardiovascular;  Laterality: N/A;  . ABDOMINAL AORTOGRAM W/LOWER EXTREMITY N/A 10/31/2017   Procedure: ABDOMINAL AORTOGRAM W/LOWER EXTREMITY;  Surgeon: Elam Dutch, MD;  Location: [place] CV LAB;  Service: Cardiovascular;  Laterality: N/A;  . ABDOMINAL AORTOGRAM W/LOWER EXTREMITY N/A 03/24/2018   Procedure: ABDOMINAL AORTOGRAM W/LOWER EXTREMITY;  Surgeon: Serafina Mitchell, MD;  Location: [place] CV LAB;  Service: Cardiovascular;  Laterality: N/A;  . AMPUTATION Left 04/26/2018   Procedure: AMPUTATION ABOVE KNEE;  Surgeon: Elam Dutch, MD;  Location: [place];  Service: Vascular;  Laterality: Left;  . AORTA - BILATERAL FEMORAL ARTERY BYPASS GRAFT N/A 11/28/2017   Procedure: AORTA BIFEMORAL BYPASS USING HEMASHIELD GOLD GRAFT & REIMPLANT IMA;  Surgeon: Serafina Mitchell, MD;  Location: [facility] OR;  Service: Vascular;  Laterality: N/A;  . AORTIC ARCH ANGIOGRAPHY N/A 10/31/2017   Procedure: AORTIC ARCH ANGIOGRAPHY;  Surgeon: Elam Dutch, MD;  Location: [place] CV LAB;  Service: Cardiovascular;  Laterality: N/A;  . APPLICATION OF WOUND VAC  11/28/2017   Procedure: APPLICATION OF WOUND VAC;  Surgeon: Serafina Mitchell, MD;  Location: [facility] OR;  Service: Vascular;;  . APPLICATION OF  WOUND VAC Left 03/27/2018   Procedure: APPLICATION OF WOUND VAC LEFT GROIN USING PREVENA PLUS;  Surgeon: Serafina Mitchell, MD;  Location: [place];  Service: Vascular;  Laterality: Left;  . ARTERIAL BYPASS SURGERY   07/05/2010   Right Common Femoral to below knee popliteal BPG  . BACK SURGERY     X's  2  . CARDIAC CATHETERIZATION    . CHOLECYSTECTOMY     Gall Bladder  . CYSTECTOMY Left    wrist  . EMBOLECTOMY Left 11/28/2017   Procedure: Left Lower Extremity Embolectomy, Left Tibial Peroneal Trunk Endarterectomy with Patch Angioplasty; Vein Harvest Small Saphenous Graft Left Lower Leg;  Surgeon: [name], MD;  Location: [place];  Service: Vascular;  Laterality: Left;  . FEMORAL-POPLITEAL BYPASS GRAFT Left 03/27/2018   Procedure: THROMBECTOMY OF LEFT FEMORAL TIBIAL BYPASS;  Surgeon: Serafina Mitchell, MD;  Location: [place];  Service: Vascular;  Laterality: Left;  . FEMORAL-TIBIAL BYPASS GRAFT Left 03/27/2018   Procedure: BYPASS GRAFT FEMORAL-TIBIAL ARTERY LEFT REDO USING CRYOPRESERVED SAPHENOUS VEIN 70cm;  Surgeon: Serafina Mitchell, MD;  Location: [facility] OR;  Service: Vascular;  Laterality: Left;  . INTERCOSTAL NERVE BLOCK  November 2015  .  INTRAOPERATIVE ARTERIOGRAM  11/28/2017   Procedure: INTRA OPERATIVE ARTERIOGRAM OF LEFT LEG;  Surgeon: Serafina Mitchell, MD;  Location: [place];  Service: Vascular;;  . INTRAOPERATIVE ARTERIOGRAM Left 03/27/2018   Procedure: INTRA OPERATIVE ARTERIOGRAM TIMES TWO;  Surgeon: Serafina Mitchell, MD;  Location: [place];  Service: Vascular;  Laterality: Left;  . IR FLUORO GUIDE CV LINE RIGHT  03/20/2017  . IR FLUORO GUIDE CV LINE RIGHT  05/05/2018  . IR REMOVAL TUN CV CATH W/O FL  05/20/2018  . IR US GUIDE VASC ACCESS RIGHT  03/20/2017  . IR US GUIDE VASC ACCESS RIGHT  05/05/2018  . IRRIGATION AND DEBRIDEMENT BUTTOCKS Right 09/30/2016   Procedure: DEBRIDEMENT RIGHT  BUTTOCK WOUND;  Surgeon: Georganna Skeans, MD;  Location: [place];  Service: General;  Laterality: Right;  .  left foot surgery    . PERIPHERAL VASCULAR CATHETERIZATION N/A 05/07/2016   Procedure: Abdominal Aortogram;  Surgeon: Serafina Mitchell, MD;  Location: [place] CV LAB;  Service: Cardiovascular;  Laterality: N/A;  . PERIPHERAL VASCULAR CATHETERIZATION N/A 05/07/2016   Procedure: Lower Extremity Angiography;  Surgeon: Serafina Mitchell, MD;  Location: [place] CV LAB;  Service: Cardiovascular;  Laterality: N/A;  . PERIPHERAL VASCULAR CATHETERIZATION N/A 05/07/2016   Procedure: Aortic Arch Angiography;  Surgeon: Serafina Mitchell, MD;  Location: [place] CV LAB;  Service: Cardiovascular;  Laterality: N/A;  . PERIPHERAL VASCULAR CATHETERIZATION N/A 05/07/2016   Procedure: Upper Extremity Angiography;  Surgeon: Serafina Mitchell, MD;  Location: [place] CV LAB;  Service: Cardiovascular;  Laterality: N/A;  . PERIPHERAL VASCULAR CATHETERIZATION Right 05/07/2016   Procedure: Peripheral Vascular Balloon Angioplasty;  Surgeon: Serafina Mitchell, MD;  Location: [place] CV LAB;  Service: Cardiovascular;  Laterality: Right;  subclavian  . PERIPHERAL VASCULAR CATHETERIZATION Right 05/07/2016   Procedure: Peripheral Vascular Intervention;  Surgeon: Serafina Mitchell, MD;  Location: [place] CV LAB;  Service: Cardiovascular;  Laterality: Right;  External  Iliac  . SKIN GRAFT Right 2012   [name] by Dr. Nils Pyle- Right and Left Ankle  . SPINE SURGERY    . THROMBECTOMY FEMORAL ARTERY  11/28/2017   Procedure: THROMBECTOMY  & REVISION OF BILATERAL FEMORAL TO POPLETEAL ARTERIES;  Surgeon: Serafina Mitchell, MD;  Location: [facility] OR;  Service: Vascular;;  . TONSILLECTOMY      Assessment & Plan Clinical Impression:  52 year old right-handed female with history of atrial fibrillation on Xarelto, diabetes mellitus with peripheral vascular disease.  Per chart review and patient, patient lives with spouse.  Used a rolling walker prior to admission.  Husband works during the day.  Patient with recent admission 03/27/2018 until  03/31/2018 for redo of left femoral to PTA bypass.  Postoperatively at that time needed thrombectomy.  Recent followed by vascular surgery with findings of occluded bypass graft.  Presented 04/25/2018 with left leg pain and ischemic changes.  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.  Hospital course pain management.  Noted right buttocks chronic stage III pressure ulcer with dressing changes as directed with follow-up per wound care nurse.  Patient developed septic shock follow-up per critical care services.  Findings of elevated troponin felt to be related to non-ST elevation MI in the setting of hypotension with cardiology follow-up initially placed on intravenous heparin.  Patient developed atrial fibrillation 05/07/2018 maintained on amiodarone.  Considerations were made for possible need for cardiac catheterization however currently on hold due to renal insufficiency.  Echocardiogram with ejection fraction of 32% grade 2 diastolic dysfunction.  Systolic function was moderately to severely reduced.  AKI in the setting of septic shock consistent with ischemic ATN with CK of 1049.  Initially maintained on intravenous fluids per renal services creatinine 4.90 hemodialysis was initiated 04/29/2018 with tunneled catheter placed.  Her last dialysis session was 05/09/2018 and latest creatinine 4.08.  Currently on subcutaneous heparin for DVT prophylaxis.  Await decision to resume Xarelto.  Physical occupational therapy evaluations completed with recommendations of physical medicine rehab consult.    Patient currently requires min with basic self-care skills secondary to muscle weakness, decreased cardiorespiratoy endurance, decreased visual acuity and decreased visual motor skills, decreased problem solving, decreased safety awareness and decreased memory and decreased standing balance and decreased balance strategies.  Prior to hospitalization, patient could  complete ADL with modified independent .  Patient will benefit from skilled intervention to decrease level of assist with basic self-care skills and increase independence with basic self-care skills prior to discharge home with care partner.  Anticipate patient will require intermittent supervision and follow up home health.  OT - End of Session Activity Tolerance: Tolerates 10 - 20 min activity with multiple rests Endurance Deficit: Yes OT Assessment Rehab Potential (ACUTE ONLY): Good OT Barriers to Discharge: Home environment access/layout OT Patient demonstrates impairments in the following area(s): Balance;Behavior;Cognition;Endurance;Pain;Safety;Vision OT Basic ADL's Functional Problem(s): Grooming;Bathing;Dressing;Toileting OT Advanced ADL's Functional Problem(s): Simple Meal Preparation OT Transfers Functional Problem(s): Toilet OT Additional Impairment(s): None OT Plan OT Intensity: Minimum of 1-2 x/day, 45 to 90 minutes OT Frequency: 5 out of 7 days OT Duration/Estimated Length of Stay: 10-14 OT Treatment/Interventions: Balance/vestibular training;Discharge planning;Pain management;Self Care/advanced ADL retraining;Therapeutic Activities;UE/LE Coordination activities;Cognitive remediation/compensation;Disease mangement/prevention;Functional mobility training;Patient/family education;Skin care/wound managment;Therapeutic Exercise;Visual/perceptual remediation/compensation;Community reintegration;DME/adaptive equipment instruction;Psychosocial support;Splinting/orthotics;Neuromuscular re-education;UE/LE Strength taining/ROM;Wheelchair propulsion/positioning OT Self Feeding Anticipated Outcome(s): no goal OT Basic Self-Care Anticipated Outcome(s): MOD I OT Toileting Anticipated Outcome(s): MOD I toileting; Set up bathing OT Bathroom Transfers Anticipated Outcome(s): MOD I toilet OT Recommendation Patient destination: Home Follow Up Recommendations: Home health OT Equipment  Recommended: None recommended by OT   Skilled Therapeutic Intervention 1:1. Pt educated on role/purpose of OT, CIR, ELOS, and POC. Pt very verbose throuhgout session requiring redirection to participation in [place] as well as prolonged rest breaks d/t decreased endurance during BADLs. Pt completes bathing UB with set up and A to wash back only. Pt dons shirt with supervision. Pt completes LB bathing/dressing with supervision/cueing to lean laterally onto elbow to advance pants past hip. Pt anxious throughout session and has 1 bout of dry heaves at end of session. RN aware. Pt left in bed semi reclined with call light in reach and all needs met.  Session 2: 1:1. No c/o pain. Pt dons pants EOB and transitions to supine to roll B with supervision and VC for clothing management. Pt completes slide board transfer to w/c with min A and VC for head hips relationship. Pt and OT discuss home set up of BSC at foot of bed and w/c on other end to lean into for clothing management. Pt does not believe there is enough space in bed room and may have to explore other room options that have more space. Pt completes slide board transfer w/c<>DAC with min A and A to place the board and similar cuing as above. Exited session with pt seated in w/c in room, call light in reach and all needs met.  OT Evaluation Precautions/Restrictions  Precautions Precautions: Fall Precaution Comments: telemetry Restrictions  Weight Bearing Restrictions: No LLE Weight Bearing: Non weight bearing Other Position/Activity Restrictions: NWB LLE at residual limb General Chart Reviewed: Yes Vital Signs Therapy Vitals Temp: 97.7 F (36.5 C) Temp Source: Oral Pulse Rate: 62 Resp: 16 BP: 122/62 Patient Position (if appropriate): Lying Oxygen Therapy SpO2: 100 % O2 Device: Room Air Pain Pain Assessment Pain Scale: 0-10 Pain Score: 9  Pain Type: Chronic pain Pain Location: Back Pain Orientation: Lower Pain Descriptors /  Indicators: Aching Pain Frequency: Intermittent Pain Onset: On-going Pain Intervention(s): Medication (See eMAR) Home Living/Prior Functioning Home Living Available Help at Discharge: Family, Available 24 hours/day Type of Home: House Home Access: Ramped entrance Home Layout: One level Bathroom Shower/Tub: Multimedia programmer: Standard IADL History Homemaking Responsibilities: No Prior Function Level of Independence: Needs assistance with ADLs(pt husband assisted with bathes right before admission) ADL   Vision Baseline Vision/History: Legally blind Patient Visual Report: Other (comment) Perception  Perception: Within Functional Limits Praxis Praxis: Intact Cognition Arousal/Alertness: Awake/alert Orientation Level: Person;Place;Situation Person: Oriented Place: Oriented Situation: Oriented Year: 2019 Month: July Day of Week: Correct Memory: Impaired Immediate Memory Recall: Sock;Blue;Bed Memory Recall: Sock;Blue Memory Recall Sock: Without Cue Memory Recall Blue: Without Cue Attention: Selective Selective Attention: Appears intact Behaviors: (anxioius) Safety/Judgment: Impaired Sensation Sensation Light Touch: (neuropathy in [name]) Stereognosis: Not tested Motor  Motor Motor: Within Functional Limits Mobility    supine>sitting EOB with supervision Sit to stand not assesed Trunk/Postural Assessment  Cervical Assessment Cervical Assessment: Within Functional Limits Thoracic Assessment Thoracic Assessment: Within Functional Limits Lumbar Assessment Lumbar Assessment: Within Functional Limits Postural Control Postural Control: Within Functional Limits  Balance Balance Balance Assessed: Yes Static Sitting Balance Static Sitting - Balance Support: No upper extremity supported Static Sitting - Level of Assistance: 5: Stand by assistance Extremity/Trunk Assessment RUE Assessment Passive Range of Motion (PROM) Comments: generalized weakness  3-/5 [name] Assessment [name] Assessment: Exceptions to [facility] Passive Range of Motion (PROM) Comments: (generalized weakness 3-/5)   See Function Navigator for Current Functional Status.   Refer to Care Plan for Long Term Goals  Recommendations for other services: Neuropsych   Discharge Criteria: Patient will be discharged from OT if patient refuses treatment 3 consecutive times without medical reason, if treatment goals not met, if there is a change in medical status, if patient makes no progress towards goals or if patient is discharged from hospital.  The above assessment, treatment plan, treatment alternatives and goals were discussed and mutually agreed upon: by patient  Tonny Branch 05/23/2018, 8:07 AM

## 2018-05-23 NOTE — Progress Notes (Signed)
Patient ID: [name], female   DOB: [birth year], 52 y.o.   MRN: [id number]   [name] is a 52 y.o. female  Who has a history of atrial fibrillation who was admitted for CIR yesterday with functional mobility deficits secondary to left AKA complicated by sepsis with acute kidney injury. Chronic medical problems include peripheral vascular disease and type 2 diabetes.  The patient is status post left AKA on 04/26/2018.  Postop hospital course complicated by pressure ulcer right buttock area as well as sepsis with acute kidney injury.  In the setting of septic shock she developed an elevated troponin level secondary to non-ST elevation MI as well as atrial fibrillation.  She was noted to have an ejection fraction of 35% on echocardiogram.  She required temporary hemodialysis and her last session was May 09, 2018.  Subjective: No new complaints. No new problems. Slept well.  Complains of chronic low back pain only  Past Medical History:  Diagnosis Date  . Abnormal stress test    a. 02/2017 MV: large region of fixed perfusion defect in basal to mid inf, mid-dist inflat walls, EF 43%. No ischemia (EF 55-60% by f/u echo).  . Arthritis   . Asthma   . Chronic back pain   . Coronary artery calcification seen on CT scan    a. 11/2017 CT Abd/Pelvis: Multi vessel coronary vascular Ca2+.  . Depression   . Diabetes mellitus   . Diabetic neuropathy ([place])   . Difficult intubation    DIFFICULT AIRWAY/FYI  . Family history of adverse reaction to anesthesia    mother had difficlty waking   . Femoral-popliteal bypass graft occlusion, left ([place]) 12/02/2017  . GERD (gastroesophageal reflux disease)   . History of echocardiogram    a. 03/2017 Echo: EF 55-60%, mild LVH, nl RV fxn.  . Hyperlipidemia   . Osteomyelitis of right fibula ([place]) 03/05/2017  . PAD (peripheral artery disease) ([place])   . Paroxysmal atrial fibrillation with rapid ventricular response ([place]) 12/02/2017   a. CHA2DS2VASc = 3-->Xarelto.   Marland Kitchen Ulcer    Foot     Objective: Vital signs in last 24 hours: Temp:  [97.7 F (36.5 C)-98.6 F (37 C)] 97.7 F (36.5 C) (07/06 0407) Pulse Rate:  [62-71] 62 (07/06 0407) Resp:  [16-18] 16 (07/06 0407) BP: (120-132)/(62-85) 122/62 (07/06 0407) SpO2:  [97 %-100 %] 100 % (07/06 0407) Weight:  [158 lb 15.2 oz (72.1 kg)] 158 lb 15.2 oz (72.1 kg) (07/06 0605) Weight change:  Last BM Date: 05/22/18  Intake/Output from previous day: 07/05 0701 - 07/06 0700 In: 720 [P.O.:720] Out: -  Last cbgs: CBG (last 3)  Recent Labs    05/22/18 1633 05/22/18 2153 05/23/18 0648  GLUCAP 91 132* 141*   Patient Vitals for the past 24 hrs:  BP Temp Temp src Pulse Resp SpO2 Weight  05/23/18 0605 - - - - - - 158 lb 15.2 oz (72.1 kg)  05/23/18 0407 122/62 97.7 F (36.5 C) Oral 62 16 100 % -  05/22/18 2026 132/67 98.6 F (37 C) Oral 69 18 99 % -  05/22/18 1700 120/85 98.4 F (36.9 C) Oral 71 18 97 % -    Physical Exam General: No apparent distress   HEENT: Strabismus Lungs: Normal effort. Lungs clear to auscultation, no crackles or wheezes. Cardiovascular: Irregular rate and rhythm, no edema Abdomen: S/NT/ND; BS(+) Musculoskeletal:  unchanged Neurological: No new neurological deficits Wounds: Status post left AKA with bandage in place Extremities-+2 right  pedal edema Mental state: Alert, oriented, cooperative    Lab Results: BMET    Component Value Date/Time   NA 138 05/22/2018 0536   K 3.4 (L) 05/22/2018 0536   CL 97 (L) 05/22/2018 0536   CO2 30 05/22/2018 0536   GLUCOSE 137 (H) 05/22/2018 0536   BUN 40 (H) 05/22/2018 0536   CREATININE 3.75 (H) 05/22/2018 1457   CREATININE 1.24 (H) 08/12/2017 1530   CALCIUM 8.4 (L) 05/22/2018 0536   GFRNONAA 13 (L) 05/22/2018 1457   GFRNONAA 50 (L) 08/12/2017 1530   GFRAA 15 (L) 05/22/2018 1457   GFRAA 57 (L) 08/12/2017 1530   CBC    Component Value Date/Time   WBC 5.3 05/22/2018 1457   RBC 3.75 (L) 05/22/2018 1457   HGB 9.7 (L)  05/22/2018 1457   HCT 32.6 (L) 05/22/2018 1457   PLT 296 05/22/2018 1457   MCV 86.9 05/22/2018 1457   MCH 25.9 (L) 05/22/2018 1457   MCHC 29.8 (L) 05/22/2018 1457   RDW 20.4 (H) 05/22/2018 1457   LYMPHSABS 2.1 05/03/2018 0427   MONOABS 0.9 05/03/2018 0427   EOSABS 0.6 05/03/2018 0427   BASOSABS 0.1 05/03/2018 0427    Medications: I have reviewed the patient's current medications.  Assessment/Plan:  Functional deficits secondary to left AKA.  Continue CIR DVT prophylaxis.  Continue subcu heparin Pain management.  Continue oxycodone as needed for moderate to severe pain Sacral decubitus follow-up per WOC AKI-creatinine continues to trend down Atrial fibrillation continue amiodarone and metoprolol Diabetes mellitus continue SSI PAD/dyslipidemia.  Continue statin therapy  Length of stay, days: 1  Marletta Lor , MD 05/23/2018, 9:17 AM

## 2018-05-23 NOTE — Evaluation (Signed)
Physical Therapy Assessment and Plan  Patient Details  Name: [name] MRN: [id number] Date of Birth: 18-Jul-1966  PT Diagnosis: Abnormal posture, Difficulty walking, Muscle weakness and Pain in [name] and back Rehab Potential: Good ELOS: 9-12 days    Today's Date: 05/23/2018 PT Individual Time: 0950-1100 PT Individual Time Calculation (min): 70 min    Problem List:  Patient Active Problem List   Diagnosis Date Noted  . Above knee amputation status, left ([place]) 05/22/2018  . AKI (acute kidney injury) ([place])   . Unilateral AKA, left ([place])   . ATN (acute tubular necrosis) ([place])   . Atrial fibrillation with rapid ventricular response ([place])   . Diabetic peripheral neuropathy ([place])   . Acute blood loss anemia   . Anemia of chronic disease   . Hyperlipidemia   . Gastroesophageal reflux disease   . Non-ST elevation (NSTEMI) myocardial infarction ([place])   . Acute combined systolic and diastolic heart failure ([place])   . Acute encephalopathy   . Acute renal failure ([place])   . Acute respiratory failure ([place])   . Sepsis ([place])   . Lower limb ischemia 04/25/2018  . Microcytic anemia 04/25/2018  . Pulmonary nodule 04/25/2018  . Gangrene of lower extremity ([place]) 04/25/2018  . Femoral-popliteal bypass graft occlusion, left ([place]) 12/02/2017  . Respiratory failure with hypoxia ([place]) 12/02/2017  . Leukocytosis 12/02/2017  . Paroxysmal atrial fibrillation with rapid ventricular response ([place]) 12/02/2017  . Thrombosis 11/24/2017  . Hyperkalemia 06/26/2017  . Atherosclerosis of native artery of right lower extremity with ulceration of ankle ([place]) 05/13/2017  . Osteomyelitis of right fibula ([place]) 03/05/2017  . Non-pressure chronic ulcer of right ankle limited to breakdown of skin ([place]) 01/17/2017  . Elevated lactic acid level   . Osteomyelitis of ankle ([place]) 12/29/2016  . Chronic pain 12/24/2016  . Buttock wound, left, subsequent encounter   . Hypotension 12/23/2016  . IDDM (insulin dependent  diabetes mellitus) ([place]) 12/23/2016  . Acute kidney injury ([place]) 12/23/2016  . Malnutrition ([place]) 12/23/2016  . Thrush 12/23/2016  . Septic shock ([place]) 11/27/2016  . DM2 (diabetes mellitus, type 2) ([place]) 11/27/2016  . Chronic back pain 11/27/2016  . Orthostatic hypotension 11/27/2016  . Demand ischemia ([place])   . Hyperlipidemia LDL goal <70   . Right sided weakness   . Retinal tumor of right eye   . Pressure injury of skin 10/01/2016  . Necrotizing fasciitis ([place]) 09/30/2016  . 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 Paresthesia of both hands 03/05/2016  . DDD (degenerative disc disease), lumbosacral 03/26/2015  . DDD (degenerative disc disease), cervical 03/26/2015  . Sacroiliac joint disease 03/26/2015  . Occipital neuralgia 03/26/2015  . Pain in limb-Bilateral Leg 04/25/2014  . PAD (peripheral artery disease) ([place]) 04/05/2013  . Chronic total occlusion of artery of the extremities ([place]) 02/10/2012  . Peripheral vascular disease, unspecified ([place]) 02/10/2012    Past Medical History:  Past Medical History:  Diagnosis Date  . Abnormal stress test    a. 02/2017 MV: large region of fixed perfusion defect in basal to mid inf, mid-dist inflat walls, EF 43%. No ischemia (EF 55-60% by f/u echo).  . Arthritis   . Asthma   . Chronic back pain   . Coronary artery calcification seen on CT scan    a. 11/2017 CT Abd/Pelvis: Multi vessel coronary vascular Ca2+.  . Depression   . Diabetes mellitus   . Diabetic neuropathy ([place])   . Difficult intubation    DIFFICULT AIRWAY/FYI  . Family  history of adverse reaction to anesthesia    mother had difficlty waking   . Femoral-popliteal bypass graft occlusion, left ([place]) 12/02/2017  . GERD (gastroesophageal reflux disease)   . History of echocardiogram    a. 03/2017 Echo: EF 55-60%, mild LVH, nl RV fxn.  . Hyperlipidemia   . Osteomyelitis of right fibula ([place]) 03/05/2017  . PAD (peripheral artery disease) ([place])   . Paroxysmal atrial  fibrillation with rapid ventricular response ([place]) 12/02/2017   a. CHA2DS2VASc = 3-->Xarelto.  Marland Kitchen Ulcer    Foot   Past Surgical History:  Past Surgical History:  Procedure Laterality Date  . ABDOMINAL AORTAGRAM  June 15, 2014  . ABDOMINAL AORTAGRAM N/A 06/15/2014   Procedure: ABDOMINAL Maxcine Ham;  Surgeon: Serafina Mitchell, MD;  Location: [facility] CATH LAB;  Service: Cardiovascular;  Laterality: N/A;  . ABDOMINAL AORTAGRAM N/A 11/22/2014   Procedure: ABDOMINAL AORTAGRAM;  Surgeon: Serafina Mitchell, MD;  Location: [facility] CATH LAB;  Service: Cardiovascular;  Laterality: N/A;  . ABDOMINAL AORTOGRAM W/LOWER EXTREMITY N/A 01/07/2017   Procedure: Abdominal Aortogram w/Lower Extremity;  Surgeon: Serafina Mitchell, MD;  Location: [place] CV LAB;  Service: Cardiovascular;  Laterality: N/A;  . ABDOMINAL AORTOGRAM W/LOWER EXTREMITY N/A 10/31/2017   Procedure: ABDOMINAL AORTOGRAM W/LOWER EXTREMITY;  Surgeon: Elam Dutch, MD;  Location: [place] CV LAB;  Service: Cardiovascular;  Laterality: N/A;  . ABDOMINAL AORTOGRAM W/LOWER EXTREMITY N/A 03/24/2018   Procedure: ABDOMINAL AORTOGRAM W/LOWER EXTREMITY;  Surgeon: Serafina Mitchell, MD;  Location: [place] CV LAB;  Service: Cardiovascular;  Laterality: N/A;  . AMPUTATION Left 04/26/2018   Procedure: AMPUTATION ABOVE KNEE;  Surgeon: Elam Dutch, MD;  Location: [place];  Service: Vascular;  Laterality: Left;  . AORTA - BILATERAL FEMORAL ARTERY BYPASS GRAFT N/A 11/28/2017   Procedure: AORTA BIFEMORAL BYPASS USING HEMASHIELD GOLD GRAFT & REIMPLANT IMA;  Surgeon: Serafina Mitchell, MD;  Location: [facility] OR;  Service: Vascular;  Laterality: N/A;  . AORTIC ARCH ANGIOGRAPHY N/A 10/31/2017   Procedure: AORTIC ARCH ANGIOGRAPHY;  Surgeon: Elam Dutch, MD;  Location: [place] CV LAB;  Service: Cardiovascular;  Laterality: N/A;  . APPLICATION OF WOUND VAC  11/28/2017   Procedure: APPLICATION OF WOUND VAC;  Surgeon: Serafina Mitchell, MD;  Location: [facility] OR;  Service:  Vascular;;  . APPLICATION OF WOUND VAC Left 03/27/2018   Procedure: APPLICATION OF WOUND VAC LEFT GROIN USING PREVENA PLUS;  Surgeon: Serafina Mitchell, MD;  Location: [place];  Service: Vascular;  Laterality: Left;  . ARTERIAL BYPASS SURGERY   07/05/2010   Right Common Femoral to below knee popliteal BPG  . BACK SURGERY     X's  2  . CARDIAC CATHETERIZATION    . CHOLECYSTECTOMY     Gall Bladder  . CYSTECTOMY Left    wrist  . EMBOLECTOMY Left 11/28/2017   Procedure: Left Lower Extremity Embolectomy, Left Tibial Peroneal Trunk Endarterectomy with Patch Angioplasty; Vein Harvest Small Saphenous Graft Left Lower Leg;  Surgeon: [name], MD;  Location: [place];  Service: Vascular;  Laterality: Left;  . FEMORAL-POPLITEAL BYPASS GRAFT Left 03/27/2018   Procedure: THROMBECTOMY OF LEFT FEMORAL TIBIAL BYPASS;  Surgeon: Serafina Mitchell, MD;  Location: [place];  Service: Vascular;  Laterality: Left;  . FEMORAL-TIBIAL BYPASS GRAFT Left 03/27/2018   Procedure: BYPASS GRAFT FEMORAL-TIBIAL ARTERY LEFT REDO USING CRYOPRESERVED SAPHENOUS VEIN 70cm;  Surgeon: Serafina Mitchell, MD;  Location[facility] OR;  Service: Vascular;  Laterality: Left;  . INTERCOSTAL  NERVE BLOCK  November 2015  . INTRAOPERATIVE ARTERIOGRAM  11/28/2017   Procedure: INTRA OPERATIVE ARTERIOGRAM OF LEFT LEG;  Surgeon: Serafina Mitchell, MD;  Location: [place];  Service: Vascular;;  . INTRAOPERATIVE ARTERIOGRAM Left 03/27/2018   Procedure: INTRA OPERATIVE ARTERIOGRAM TIMES TWO;  Surgeon: Serafina Mitchell, MD;  Location: [place];  Service: Vascular;  Laterality: Left;  . IR FLUORO GUIDE CV LINE RIGHT  03/20/2017  . IR FLUORO GUIDE CV LINE RIGHT  05/05/2018  . IR REMOVAL TUN CV CATH W/O FL  05/20/2018  . IR US GUIDE VASC ACCESS RIGHT  03/20/2017  . IR US GUIDE VASC ACCESS RIGHT  05/05/2018  . IRRIGATION AND DEBRIDEMENT BUTTOCKS Right 09/30/2016   Procedure: DEBRIDEMENT RIGHT  BUTTOCK WOUND;  Surgeon: Georganna Skeans, MD;  Location: [place];  Service:  General;  Laterality: Right;  . left foot surgery    . PERIPHERAL VASCULAR CATHETERIZATION N/A 05/07/2016   Procedure: Abdominal Aortogram;  Surgeon: Serafina Mitchell, MD;  Location: [place] CV LAB;  Service: Cardiovascular;  Laterality: N/A;  . PERIPHERAL VASCULAR CATHETERIZATION N/A 05/07/2016   Procedure: Lower Extremity Angiography;  Surgeon: Serafina Mitchell, MD;  Location: [place] CV LAB;  Service: Cardiovascular;  Laterality: N/A;  . PERIPHERAL VASCULAR CATHETERIZATION N/A 05/07/2016   Procedure: Aortic Arch Angiography;  Surgeon: Serafina Mitchell, MD;  Location: [place] CV LAB;  Service: Cardiovascular;  Laterality: N/A;  . PERIPHERAL VASCULAR CATHETERIZATION N/A 05/07/2016   Procedure: Upper Extremity Angiography;  Surgeon: Serafina Mitchell, MD;  Location: [place] CV LAB;  Service: Cardiovascular;  Laterality: N/A;  . PERIPHERAL VASCULAR CATHETERIZATION Right 05/07/2016   Procedure: Peripheral Vascular Balloon Angioplasty;  Surgeon: Serafina Mitchell, MD;  Location: [place] CV LAB;  Service: Cardiovascular;  Laterality: Right;  subclavian  . PERIPHERAL VASCULAR CATHETERIZATION Right 05/07/2016   Procedure: Peripheral Vascular Intervention;  Surgeon: Serafina Mitchell, MD;  Location: [place] CV LAB;  Service: Cardiovascular;  Laterality: Right;  External  Iliac  . SKIN GRAFT Right 2012   [name] by Dr. Nils Pyle- Right and Left Ankle  . SPINE SURGERY    . THROMBECTOMY FEMORAL ARTERY  11/28/2017   Procedure: THROMBECTOMY  & REVISION OF BILATERAL FEMORAL TO POPLETEAL ARTERIES;  Surgeon: Serafina Mitchell, MD;  Location: [facility] OR;  Service: Vascular;;  . TONSILLECTOMY      Assessment & Plan Clinical Impression: Patient is a 52 year old right-handed female with history of atrial fibrillation on Xarelto, diabetes mellitus with peripheral vascular disease.  Per chart review and patient, patient lives with spouse.  Used a rolling walker prior to admission.  Husband works during the day.   Patient with recent admission 03/27/2018 until 03/31/2018 for redo of left femoral to PTA bypass.  Postoperatively at that time needed thrombectomy.  Recent followed by vascular surgery with findings of occluded bypass graft.  Presented 04/25/2018 with left leg pain and ischemic changes.  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.  Hospital course pain management.  Noted right buttocks chronic stage III pressure ulcer with dressing changes as directed with follow-up per wound care nurse.  Patient developed septic shock follow-up per critical care services.  Findings of elevated troponin felt to be related to non-ST elevation MI in the setting of hypotension with cardiology follow-up initially placed on intravenous heparin.  Patient developed atrial fibrillation 05/07/2018 maintained on amiodarone.  Considerations were made for possible need for cardiac  catheterization however currently on hold due to renal insufficiency.  Echocardiogram with ejection fraction of 16% grade 2 diastolic dysfunction.  Systolic function was moderately to severely reduced.  AKI in the setting of septic shock consistent with ischemic ATN with CK of 1049.  Initially maintained on intravenous fluids per renal services creatinine 4.90 hemodialysis was initiated 04/29/2018 with tunneled catheter placed.  Her last dialysis session was 05/09/2018 and latest creatinine 4.08.  Currently on subcutaneous heparin for DVT prophylaxis.  Await decision to resume Xarelto.  Physical occupational therapy evaluations completed with recommendations of physical medicine rehab consult. Patient transferred to CIR on 05/22/2018 .   Patient currently requires mod with mobility secondary to muscle weakness and muscle joint tightness, decreased cardiorespiratoy endurance, decreased visual acuity, decreased awareness, decreased problem solving, decreased safety awareness and decreased memory and decreased sitting  balance, decreased standing balance, decreased balance strategies and difficulty maintaining precautions.  Prior to hospitalization, patient was modified independent  with mobility and lived with   in a House home.  Home access is  Ramped entrance.  Patient will benefit from skilled PT intervention to maximize safe functional mobility, minimize fall risk and decrease caregiver burden for planned discharge home with intermittent assist.  Anticipate patient will benefit from follow up [facility] at discharge.  PT - End of Session Activity Tolerance: Tolerates 10 - 20 min activity with multiple rests Endurance Deficit: Yes PT Assessment Rehab Potential (ACUTE/IP ONLY): Good PT Barriers to Discharge: Decreased caregiver support;Medical stability;Home environment access/layout;Wound Care;Lack of/limited family support PT Patient demonstrates impairments in the following area(s): Balance;Behavior;Edema;Endurance;Nutrition;Pain;Safety;Sensory;Skin Integrity;Other (comment) PT Transfers Functional Problem(s): Bed Mobility;Bed to Chair;Car;Furniture;Floor PT Locomotion Functional Problem(s): Ambulation;Wheelchair Mobility;Stairs PT Plan PT Intensity: Minimum of 1-2 x/day ,45 to 90 minutes PT Frequency: 5 out of 7 days PT Duration Estimated Length of Stay: 9-12 days  PT Treatment/Interventions: Ambulation/gait training;Balance/vestibular training;Cognitive remediation/compensation;Community reintegration;Discharge planning;Disease management/prevention;Functional mobility training;Psychosocial support;DME/adaptive equipment instruction;Neuromuscular re-education;Pain management;Patient/family education;Skin care/wound management;Therapeutic Activities;Splinting/orthotics;Therapeutic Exercise;UE/LE Coordination activities;Stair training;UE/LE Strength taining/ROM;Visual/perceptual remediation/compensation;Wheelchair propulsion/positioning PT Transfers Anticipated Outcome(s): Mod I with LRAD  PT Locomotion  Anticipated Outcome(s): Supervision assist atWC level.  PT Recommendation Recommendations for Other Services: Therapeutic Recreation consult Therapeutic Recreation Interventions: Outing/community reintergration;Stress management Follow Up Recommendations: Home health PT Patient destination: Home Equipment Recommended: To be determined;Wheelchair (measurements);Wheelchair cushion (measurements)  Skilled Therapeutic Intervention Pt received supine in bed and agreeable to PT. Supine>sit transfer with supervision assist and min cues for use of rails. Pt very anxious and nauseous initially and resistant to attempted movement. PT instructed patient in PT Evaluation and initiated treatment intervention; see below for results. PT educated patient in [place], rehab potential, rehab goals, and discharge recommendations. Patient returned to room and left sitting in [facility] with call bell in reach and all needs met.       PT Evaluation Precautions/Restrictions Precautions Precautions: Fall Precaution Comments: telemetry Restrictions Weight Bearing Restrictions: Yes LLE Weight Bearing: Non weight bearing Other Position/Activity Restrictions: NWB LLE at residual limb General   Vital Signs  Pain Pain Assessment Pain Scale: 0-10 Pain Score: 10-Worst pain ever Pain Type: Chronic pain Pain Location: Back Pain Orientation: Mid Patients Stated Pain Goal: 6 Home Living/Prior Functioning Home Living Available Help at Discharge: Family;Available 24 hours/day Type of Home: House Home Access: Ramped entrance Home Layout: One level Bathroom Shower/Tub: Multimedia programmer: Standard Prior Function Level of Independence: Needs assistance with ADLs(pt husband assisted with bathes right before admission)  Able to Take Stairs?: Yes Driving: No Vocation: On disability Vocation Requirements:  started losing sight about 2 years ago,  Comments: reports intermittent use of rollator at  baseline Vision/Perception  Vision - Assessment Additional Comments: 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 hypersensitive on the LLE. ) Stereognosis: Not tested Coordination Gross Motor Movements are Fluid and Coordinated: Yes Fine Motor Movements are Fluid and Coordinated: Yes Motor  Motor Motor: Within Functional Limits  Mobility Bed Mobility Bed Mobility: Rolling Right;Rolling Left;Supine to Sit Rolling Right: Supervision/verbal cueing Rolling Left: Supervision/Verbal cueing Supine to Sit: Supervision/Verbal cueing Transfers Transfers: Lateral/Scoot Transfers Lateral/Scoot Transfers: Moderate Assistance - Patient 50-74%;Minimal Assistance - Patient > 75% Transfer (Assistive device): Other (Comment)(SB ) Locomotion  Gait Ambulation: No Gait Gait: No Stairs / Additional Locomotion Stairs: No Wheelchair Mobility Wheelchair Mobility: Yes Wheelchair Assistance: Minimal assistance - Patient >75% Wheelchair Propulsion: Both upper extremities Wheelchair Parts Management: Needs assistance Distance: 263f   Trunk/Postural Assessment  Cervical Assessment Cervical Assessment: Exceptions to WFL(forward head) Thoracic Assessment Thoracic Assessment: Exceptions to WFL(rounded shoulders) Lumbar Assessment Lumbar Assessment: Exceptions to WFL(posterior pelvic tilt) Postural Control Postural Control: Within Functional Limits  Balance Balance Balance Assessed: Yes Static Sitting Balance Static Sitting - Balance Support: No upper extremity supported Static Sitting - Level of Assistance: 5: Stand by assistance Extremity  Assessment  RUE Assessment Passive Range of Motion (PROM) Comments: generalized weakness 3-/5 [name] Assessment [name] Assessment: Exceptions to WH[facility] HospitalPassive Range of Motion (PROM) Comments: (generalized weakness 3-/5) [name] Assessment [name] Assessment: Exceptions to [facility] SurgeryGeneral Strength Comments: 4/5 knee knee extension. 4-/5 hip adduction/abduction and ankle DF LLE Assessment LLE Assessment: Exceptions to WBell [facility] HospitalGeneral Strength Comments: at least 3+/5 hip flexion/extension/abduction/adduciton    See Function Navigator for Current Functional Status.   Refer to Care Plan for Long Term Goals  Recommendations for other services: Therapeutic Recreation  Stress management and Outing/community reintegration  Discharge Criteria: Patient will be discharged from PT if patient refuses treatment 3 consecutive times without medical reason, if treatment goals not met, if there is a change in medical status, if patient makes no progress towards goals or if patient is discharged from hospital.  The above assessment, treatment plan, treatment alternatives and goals were discussed and mutually agreed upon: by patient  ALorie Phenix7/04/2018, 11:08 AM

## 2018-05-24 ENCOUNTER — Inpatient Hospital Stay (HOSPITAL_COMMUNITY): Payer: Medicare Other

## 2018-05-24 LAB — GLUCOSE, CAPILLARY
GLUCOSE-CAPILLARY: 148 mg/dL — AB (ref 70–99)
GLUCOSE-CAPILLARY: 146 mg/dL — AB (ref 70–99)
Glucose-Capillary: 200 mg/dL — ABNORMAL HIGH (ref 70–99)
Glucose-Capillary: 194 mg/dL — ABNORMAL HIGH (ref 70–99)
Glucose-Capillary: 156 mg/dL — ABNORMAL HIGH (ref 70–99)

## 2018-05-24 NOTE — Progress Notes (Signed)
Physical Therapy Session Note  Patient Details  Name: [name] MRN: [id number] Date of Birth: Jan 18, 1966  Today's Date: 05/24/2018 PT Individual Time: 1615-1700 PT Individual Time Calculation (min): 45 min   Short Term Goals: Week 1:  PT Short Term Goal 1 (Week 1): STG=LTG due to ELOS   Skilled Therapeutic Interventions/Progress Updates:    Pt seated in w/c upon PT arrival, agreeable to therapy tx and reports pain 10/10 in low back. Pt received pain medication from RN. Pt propelled w/c from room>gym x 150 ft with B UEs and supervision, verbal cues for techniques. Pt performed slideboard transfer from w/c<>mat with min assist, lateral scoot. Pt transferred from sitting>supine reclined on mat with min assist. Pt educated on LE strengthening exercises and performed 2 x 10 of the following: bilateral hip flexion, bilateral hip abduction, R LE SAQ. Pt educated on pressure relief techniques, limb desensitization and edema control. Therapist discussed with pt and her husband about getting measurements for car heights for d/c planning.Therapist replaced basic cushion with roho cushion for adequate pressure relief.  Pt transferred to sitting with min assist and performed slideboard transfer back to w/c with min assist. Pt transported back to room and left seated with needs in reach.   Therapy Documentation Precautions:  Precautions Precautions: Fall Precaution Comments: telemetry Restrictions Weight Bearing Restrictions: No LLE Weight Bearing: Non weight bearing Other Position/Activity Restrictions: NWB LLE at residual limb   See Function Navigator for Current Functional Status.   Therapy/Group: Individual Therapy  Netta Corrigan, PT, DPT 05/24/2018, 1:00 PM

## 2018-05-24 NOTE — Progress Notes (Signed)
Occupational Therapy Session Note  Patient Details  Name: [name] MRN: [id number] Date of Birth: 03-04-1966  Today's Date: 05/24/2018 OT Individual Time: 1500-1556 OT Individual Time Calculation (min): 56 min    Short Term Goals: Week 1:  OT Short Term Goal 1 (Week 1): Pt will complete slide board transfer to [facility] with min A OT Short Term Goal 2 (Week 1): Pt will don footwear wiht supervision OT Short Term Goal 3 (Week 1): Pt will complete clothing management during toileting wiht MIN A using lateral leans  Skilled Therapeutic Interventions/Progress Updates:    1;1. Pt and husband present for therapy. Pt completes slide board transfers throughout session recliner>w/c<>EOM with min A and cueing for head hips relationship. Pt completes laundry folding task with supervision EOM leaning laterally onto B elbows to obtain towels, fold and place in w/c on opposite side to improve strength of BUE and lateral leans in prep for clothing management. Pt, OT and husband discuss DME and OT shoes TTB to go in shower for pt to be able to shower at home. PT reporting not having TTB (contrary to what was said yesterday). Pt and husband to decide if want to order or purchase out of pocket  Toward d/c date. OT applies colorful coban markers to indicate arm rest lever and w/c brakes d/t low vision for improved w/c parts management for transfers. Husband to apply same markers at home. Exited sessionw iht pt seated in w.c, call light in reach and all needs met Therapy Documentation Precautions:  Precautions Precautions: Fall Precaution Comments: telemetry Restrictions Weight Bearing Restrictions: No LLE Weight Bearing: Non weight bearing Other Position/Activity Restrictions: NWB LLE at residual limb General:    See Function Navigator for Current Functional Status.   Therapy/Group: Individual Therapy  Tonny Branch 05/24/2018, 3:59 PM

## 2018-05-24 NOTE — Consult Note (Signed)
[facility] Nurse requested for assessment of same areas assessed by my colleagues, [place] Nurse D. Engels on 05/22/18.  Wound care orders are in place and are appropriate.  I will add a pressure redistribution chair pad as patient did not bring hers from home and because she sits on a sofa cushion which is not a pressure redistribution seating surface. No other orders/interventions required at this time.  [facility] nursing team will not follow, but will remain available to this patient, the nursing and medical teams.  Please re-consult if needed. Thanks, Maudie Flakes, MSN, RN, [place], Arther Abbott  Pager# [phone]

## 2018-05-24 NOTE — Progress Notes (Signed)
Patient ID: [name], female   DOB: September 02, 1966, 52 y.o.   MRN: [id number]   [name] is a 52 y.o. female admitted for rehab 2 days ago with functional deficits following a left AKA which was comp gated by sepsis syndrome with acute kidney injury.  Postop complications included a non-ST elevation MI as well as new onset atrial fibrillation.  Ejection fraction was noted to be 35%.  She required temporary HD and her last session was May 09, 2018   Subjective: No new complaints.  She has a history of chronic low back pain and had been on a preadmission regimen of hydrocodone twice daily that was resumed yesterday  Past Medical History:  Diagnosis Date  . Abnormal stress test    a. 02/2017 MV: large region of fixed perfusion defect in basal to mid inf, mid-dist inflat walls, EF 43%. No ischemia (EF 55-60% by f/u echo).  . Arthritis   . Asthma   . Chronic back pain   . Coronary artery calcification seen on CT scan    a. 11/2017 CT Abd/Pelvis: Multi vessel coronary vascular Ca2+.  . Depression   . Diabetes mellitus   . Diabetic neuropathy ([place])   . Difficult intubation    DIFFICULT AIRWAY/FYI  . Family history of adverse reaction to anesthesia    mother had difficlty waking   . 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 GERD (gastroesophageal reflux disease)   . History of echocardiogram    a. 03/2017 Echo: EF 55-60%, mild LVH, nl RV fxn.  . Hyperlipidemia   . 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 PAD (peripheral artery disease) ([place])   . Paroxysmal atrial fibrillation with rapid ventricular response ([place]) 12/02/2017   a. CHA2DS2VASc = 3-->Xarelto.  Marland Kitchen Ulcer    Foot   Patient Vitals for the past 24 hrs:  BP Temp Temp src Pulse Resp SpO2 Weight  05/24/18 0500 - - - - - - 161 lb 2.5 oz (73.1 kg)  05/24/18 0417 (!) 121/57 97.6 F (36.4 C) Oral 63 18 97 % -  05/23/18 1959 100/60 97.6 F (36.4 C) Oral (!) 59 18 100 % -  05/23/18 1244 137/65 98.1  F (36.7 C) Oral 67 18 99 % -      Objective: Vital signs in last 24 hours: Temp:  [97.6 F (36.4 C)-98.1 F (36.7 C)] 97.6 F (36.4 C) (07/07 0417) Pulse Rate:  [59-67] 63 (07/07 0417) Resp:  [18] 18 (07/07 0417) BP: (100-137)/(57-65) 121/57 (07/07 0417) SpO2:  [97 %-100 %] 97 % (07/07 0417) Weight:  [161 lb 2.5 oz (73.1 kg)] 161 lb 2.5 oz (73.1 kg) (07/07 0500) Weight change: 2 lb 3.3 oz (1 kg) Last BM Date: 05/23/18  Intake/Output from previous day: 07/06 0701 - 07/07 0700 In: 1080 [P.O.:1080] Out: -  Last cbgs: CBG (last 3)  Recent Labs    05/23/18 2131 05/24/18 0623 05/24/18 0646  GLUCAP 172* 148* 146*     Physical Exam General: No apparent distress   HEENT: Strabismus noted Lungs: Normal effort. Lungs clear to auscultation, no crackles or wheezes. Cardiovascular: Regular rate and rhythm, no edema Abdomen: S/NT/ND; BS(+) Musculoskeletal:  unchanged Neurological: Nonfocal Wounds: Status post left AKA with bandage in place Extremities.  +2 right pedal edema Mental state: Alert, oriented, cooperative    Lab Results: BMET    Component Value Date/Time   NA 138 05/22/2018 0536   K 3.4 (L) 05/22/2018 0536   CL 97 (  L) 05/22/2018 0536   CO2 30 05/22/2018 0536   GLUCOSE 137 (H) 05/22/2018 0536   BUN 40 (H) 05/22/2018 0536   CREATININE 3.75 (H) 05/22/2018 1457   CREATININE 1.24 (H) 08/12/2017 1530   CALCIUM 8.4 (L) 05/22/2018 0536   GFRNONAA 13 (L) 05/22/2018 1457   GFRNONAA 50 (L) 08/12/2017 1530   GFRAA 15 (L) 05/22/2018 1457   GFRAA 57 (L) 08/12/2017 1530   CBC    Component Value Date/Time   WBC 5.3 05/22/2018 1457   RBC 3.75 (L) 05/22/2018 1457   HGB 9.7 (L) 05/22/2018 1457   HCT 32.6 (L) 05/22/2018 1457   PLT 296 05/22/2018 1457   MCV 86.9 05/22/2018 1457   MCH 25.9 (L) 05/22/2018 1457   MCHC 29.8 (L) 05/22/2018 1457   RDW 20.4 (H) 05/22/2018 1457   LYMPHSABS 2.1 05/03/2018 0427   MONOABS 0.9 05/03/2018 0427   EOSABS 0.6 05/03/2018 0427    BASOSABS 0.1 05/03/2018 0427    Medications: I have reviewed the patient's current medications.  Assessment/Plan:  Functional deficits secondary to left AKA complicated by sepsis syndrome.  Continue CIR DVT prophylaxis.  Continue subcu heparin.  TED hose ordered for right leg to assist with pedal edema Pain management.  Pre-admit hydrocodone twice daily ordered; continue as needed oxycodone Diabetes mellitus.  Reasonable glycemic control\atrial fibrillation.  Continue amiodarone and metoprolol\ History of AKI.  Continue to monitor renal indices.  Lab ordered for a.m.    Length of stay, days: 2  Marletta Lor , MD 05/24/2018, 8:51 AM

## 2018-05-25 ENCOUNTER — Inpatient Hospital Stay (HOSPITAL_COMMUNITY): Payer: Medicare Other | Admitting: Physical Therapy

## 2018-05-25 ENCOUNTER — Inpatient Hospital Stay (HOSPITAL_COMMUNITY): Payer: Medicare Other

## 2018-05-25 DIAGNOSIS — I5032 Chronic diastolic (congestive) heart failure: Secondary | ICD-10-CM

## 2018-05-25 DIAGNOSIS — N179 Acute kidney failure, unspecified: Secondary | ICD-10-CM

## 2018-05-25 DIAGNOSIS — E8809 Other disorders of plasma-protein metabolism, not elsewhere classified: Secondary | ICD-10-CM

## 2018-05-25 DIAGNOSIS — I5042 Chronic combined systolic (congestive) and diastolic (congestive) heart failure: Secondary | ICD-10-CM

## 2018-05-25 DIAGNOSIS — Z89612 Acquired absence of left leg above knee: Secondary | ICD-10-CM

## 2018-05-25 DIAGNOSIS — E46 Unspecified protein-calorie malnutrition: Secondary | ICD-10-CM

## 2018-05-25 DIAGNOSIS — D62 Acute posthemorrhagic anemia: Secondary | ICD-10-CM

## 2018-05-25 DIAGNOSIS — E1142 Type 2 diabetes mellitus with diabetic polyneuropathy: Secondary | ICD-10-CM

## 2018-05-25 LAB — COMPREHENSIVE METABOLIC PANEL
ALT: 16 U/L (ref 0–44)
AST: 20 U/L (ref 15–41)
Albumin: 2.6 g/dL — ABNORMAL LOW (ref 3.5–5.0)
Alkaline Phosphatase: 80 U/L (ref 38–126)
Anion gap: 9 (ref 5–15)
BILIRUBIN TOTAL: 0.6 mg/dL (ref 0.3–1.2)
BUN: 33 mg/dL — AB (ref 6–20)
CHLORIDE: 101 mmol/L (ref 98–111)
CO2: 29 mmol/L (ref 22–32)
CREATININE: 2.99 mg/dL — AB (ref 0.44–1.00)
Calcium: 8.8 mg/dL — ABNORMAL LOW (ref 8.9–10.3)
GFR, EST AFRICAN AMERICAN: 19 mL/min — AB (ref >60)
GFR, EST NON AFRICAN AMERICAN: 17 mL/min — AB (ref >60)
Glucose, Bld: 175 mg/dL — ABNORMAL HIGH (ref 70–99)
POTASSIUM: 4.1 mmol/L (ref 3.5–5.1)
Sodium: 139 mmol/L (ref 135–145)
TOTAL PROTEIN: 6 g/dL — AB (ref 6.5–8.1)

## 2018-05-25 LAB — CBC WITH DIFFERENTIAL/PLATELET
Abs Immature Granulocytes: 0.1 10*3/uL (ref 0.0–0.1)
BASOS PCT: 1 %
Basophils Absolute: 0 10*3/uL (ref 0.0–0.1)
EOS ABS: 0.4 10*3/uL (ref 0.0–0.7)
EOS PCT: 5 %
HEMATOCRIT: 31.9 % — AB (ref 36.0–46.0)
Hemoglobin: 9.5 g/dL — ABNORMAL LOW (ref 12.0–15.0)
Immature Granulocytes: 1 %
LYMPHS ABS: 1.3 10*3/uL (ref 0.7–4.0)
Lymphocytes Relative: 16 %
MCH: 26 pg (ref 26.0–34.0)
MCHC: 29.8 g/dL — ABNORMAL LOW (ref 30.0–36.0)
MCV: 87.4 fL (ref 78.0–100.0)
MONO ABS: 0.6 10*3/uL (ref 0.1–1.0)
MONOS PCT: 7 %
Neutro Abs: 5.5 10*3/uL (ref 1.7–7.7)
Neutrophils Relative %: 70 %
PLATELETS: 262 10*3/uL (ref 150–400)
RBC: 3.65 MIL/uL — ABNORMAL LOW (ref 3.87–5.11)
RDW: 21 % — AB (ref 11.5–15.5)
WBC: 7.8 10*3/uL (ref 4.0–10.5)

## 2018-05-25 LAB — GLUCOSE, CAPILLARY
GLUCOSE-CAPILLARY: 172 mg/dL — AB (ref 70–99)
GLUCOSE-CAPILLARY: 239 mg/dL — AB (ref 70–99)
Glucose-Capillary: 193 mg/dL — ABNORMAL HIGH (ref 70–99)
Glucose-Capillary: 159 mg/dL — ABNORMAL HIGH (ref 70–99)

## 2018-05-25 MED ORDER — PRO-STAT SUGAR FREE PO LIQD
30.0000 mL | Freq: Two times a day (BID) | ORAL | Status: DC
  Administered 2018-05-25 – 2018-05-31 (×3): 30 mL via ORAL
  Filled 2018-05-25 (×17): qty 30

## 2018-05-25 MED ORDER — INSULIN GLARGINE 100 UNIT/ML ~~LOC~~ SOLN
10.0000 [IU] | Freq: Two times a day (BID) | SUBCUTANEOUS | Status: DC
  Administered 2018-05-25 – 2018-05-27 (×6): 10 [IU] via SUBCUTANEOUS
  Filled 2018-05-25 (×7): qty 0.1

## 2018-05-25 NOTE — Progress Notes (Signed)
Physical Therapy Session Note  Patient Details  Name: [name] MRN: [id number] Date of Birth: 1966-06-25  Today's Date: 05/25/2018 PT Individual Time: 1300-1415 PT Individual Time Calculation (min): 75 min   Short Term Goals: Week 1:  PT Short Term Goal 1 (Week 1): STG=LTG due to ELOS   Skilled Therapeutic Interventions/Progress Updates: Pt received seated in bed with handoff from nursing; c/o pain as below and reports just recently taking pain medication. Supine>sit with S from flat bed. Lateral scoot transfer with transfer board bed>w/c with min guard and assist for board placement, min cues for technique. W/c propulsion x175' with BUE and S, slow speed. Transfer w/c>mat table with transfer board and setupA for board placement, cues for scooting forward in chair prior to placing board to avoid shearing skin over w/c wheel. Long arc quad [name] 2x15 reps, hip flexion marching 2x15 reps. Requested shrinker from PA; PA to check with MD but cleared for ace wrap for now to A with edema and pain management. Educated pt in purpose of compression wrapping; therapist performed totalA. Supine straight leg raises ([name] AAROM), glute sets, hip adduction/abduction isometrics, crunches all 2x15 reps. Pt became tearful at one point because she is unable to perform exercises independently, states she is working very hard but is frustrated by how weak she is and how she is having to rely on others for physical assistance; provided emotional support and encouragement. Returned to w/c with S slideboard transfer. Returned to room totalA in w/c for energy conservation; remained seated in w/c at end of session, lap belt alarm intact, all needs in reach.      Therapy Documentation Precautions:  Precautions Precautions: Fall Precaution Comments: telemetry Restrictions Weight Bearing Restrictions: No LLE Weight Bearing: Non weight bearing Other Position/Activity Restrictions: NWB LLE at residual limb Pain: Pain  Assessment Pain Scale: 0-10 Pain Score: 2  Faces Pain Scale: Hurts a little bit Pain Type: Acute pain Pain Location: Leg Pain Descriptors / Indicators: Aching Pain Onset: On-going Pain Intervention(s): Medication (See eMAR)  See Function Navigator for Current Functional Status.   Therapy/Group: Individual Therapy  Luberta Mutter 05/25/2018, 7:52 AM

## 2018-05-25 NOTE — Progress Notes (Signed)
[facility] PHYSICAL MEDICINE & REHABILITATION     PROGRESS NOTE  Subjective/Complaints:  Patient seen lying in bed this morning. She states she slept fairly overnight. She is upside down in bed.  ROS: deny CP, SOB, nausea, vomiting, diarrhea.  Objective: Vital Signs: Blood pressure 117/62, pulse 66, temperature 97.8 F (36.6 C), temperature source Oral, resp. rate 18, weight 71.6 kg (157 lb 13.6 oz), SpO2 99 %. No results found. Recent Labs    05/22/18 1457 05/25/18 0532  WBC 5.3 7.8  HGB 9.7* 9.5*  HCT 32.6* 31.9*  PLT 296 262   Recent Labs    05/22/18 1457 05/25/18 0532  NA  --  139  K  --  4.1  CL  --  101  GLUCOSE  --  175*  BUN  --  33*  CREATININE 3.75* 2.99*  CALCIUM  --  8.8*   CBG (last 3)  Recent Labs    05/24/18 1704 05/24/18 2152 05/25/18 0651  GLUCAP 194* 156* 193*    Wt Readings from Last 3 Encounters:  05/25/18 71.6 kg (157 lb 13.6 oz)  05/22/18 74.8 kg (164 lb 14.5 oz)  04/20/18 72.6 kg (160 lb)    Physical Exam:  BP 117/62   Pulse 66   Temp 97.8 F (36.6 C) (Oral)   Resp 18   Wt 71.6 kg (157 lb 13.6 oz)   SpO2 99%   BMI 24.72 kg/m  Constitutional: She appears well-developed and well-nourished.  HENT: Normocephalic and atraumatic.  Eyes: No discharge. Ambylopia  Cardiovascular: Normal rate and regular rhythm. No JVD. Respiratory: Effort normal and breath sounds normal.  GI: Bowel sounds are normal. She exhibits no distension.  Musculoskeletal: Left stump and [name] edema  Neurological: She is alert and oriented.  Motor: B/l UE 4+/5 proximal to distal LLE: HF 4-/5 (pain inhibition) [name]: HF 4-/5, KE 4-/5, ADF 4+/5 (some pain inhibition) Sensation diminished to light touch [name]  Skin: Amputee site with staples c/d/i  Psychiatric: She has a normal mood and affect. Her behavior is normal.    Assessment/Plan: 1. Functional deficits secondary to left AKA which require 3+ hours per day of interdisciplinary therapy in a comprehensive  inpatient rehab setting. Physiatrist is providing close team supervision and 24 hour management of active medical problems listed below. Physiatrist and rehab team continue to assess barriers to discharge/monitor patient progress toward functional and medical goals.  Function:  Bathing Bathing position   Position: Sitting EOB  Bathing parts Body parts bathed by patient: Right arm, Left arm, Chest, Abdomen, Front perineal area, Buttocks, Right upper leg, Left upper leg, Right lower leg Body parts bathed by helper: Back  Bathing assist Assist Level: Supervision or verbal cues      Upper Body Dressing/Undressing Upper body dressing   What is the patient wearing?: Pull over shirt/dress     Pull over shirt/dress - Perfomed by patient: Thread/unthread right sleeve, Thread/unthread left sleeve, Put head through opening, Pull shirt over trunk          Upper body assist Assist Level: Set up      Lower Body Dressing/Undressing Lower body dressing   What is the patient wearing?: Pants, Non-skid slipper socks, Underwear Underwear - Performed by patient: Thread/unthread right underwear leg, Thread/unthread left underwear leg, Pull underwear up/down   Pants- Performed by patient: Thread/unthread right pants leg, Thread/unthread left pants leg, Pull pants up/down     Non-skid slipper socks- Performed by helper: Don/doff right sock  Lower body assist Assist for lower body dressing: Supervision or verbal cues      Toileting Toileting Toileting activity did not occur: No continent bowel/bladder event Toileting steps completed by patient: Adjust clothing prior to toileting, Adjust clothing after toileting Toileting steps completed by helper: Performs perineal hygiene    Toileting assist Assist level: Touching or steadying assistance (Pt.75%)   Transfers Chair/bed transfer   Chair/bed transfer method: Lateral scoot Chair/bed transfer assist level: Touching or  steadying assistance (Pt > 75%) Chair/bed transfer assistive device: Sliding board     Locomotion Ambulation Ambulation activity did not occur: Safety/medical concerns         Wheelchair   Type: Manual Max wheelchair distance: 150 ft Assist Level: Supervision or verbal cues  Cognition Comprehension Comprehension assist level: Understands complex 90% of the time/cues 10% of the time  Expression Expression assist level: Expresses basic needs/ideas: With no assist  Social Interaction Social Interaction assist level: Interacts appropriately 90% of the time - Needs monitoring or encouragement for participation or interaction.  Problem Solving Problem solving assist level: Solves basic problems with no assist  Memory Memory assist level: More than reasonable amount of time    Medical Problem List and Plan: 1.  Decreased functional mobility secondary to left AKA 04/24/8937 complicated by septic shock and AKI  Cont CIR 2.  DVT Prophylaxis/Anticoagulation: Subcutaneous heparin 3. Pain Management: Oxycodone as needed 4. Mood: Provide emotional support 5. Neuropsych: This patient is capable of making decisions on her own behalf. 6. Skin/Wound Care/sacral decubitus: Routine skin checks and follow-up per WOC for wound recommendations 7. Fluids/Electrolytes/Nutrition: Routine in and outs 8.  AKI in setting of septic shock consistent with ischemic ATN.  Follow-up per renal services.  No indication for hemodialysis at this time.  Baseline creatinine was 0.9-1  Cr 2.99 on 7/8  Cont to monitor 9.  NSTEMI/atrial fibrillation with RVR.  Discussed plan for cardiac catheterization once renal function stabilized.  Presently on amiodarone 200 mg daily, Lopressor 12.5 mg twice daily.  No plan to resume Xarelto at this time 10.  Diastolic congestive heart failure.  Monitor for any signs of fluid overload Filed Weights   05/23/18 0605 05/24/18 0500 05/25/18 0500  Weight: 72.1 kg (158 lb 15.2 oz) 73.1 kg  (161 lb 2.5 oz) 71.6 kg (157 lb 13.6 oz)  11.  Diabetes mellitus with peripheral neuropathy.  Latest hemoglobin A1c 7.2.  SSI.    Patient on Humalog 10 units 3 times daily  Lantus insulin 36 units twice daily prior to admission.    Lantus 10 BID started on 7/8  12.  Acute on chronic anemia.  Ferrous sulfate 325 mg twice daily.    Hb 9.5 on 7/8  Cont to monitor 13.  Hyperlipidemia.  Crestor 14.  GERD.  Protonix 15. Hypoalbuminemia  Supplement initiated on 7/8  LOS (Days) 3 A FACE TO FACE EVALUATION WAS PERFORMED  Ankit Lorie Phenix 05/25/2018 7:57 AM

## 2018-05-25 NOTE — Progress Notes (Signed)
Occupational Therapy Session Note  Patient Details  Name: [name] MRN: [id number] Date of Birth: [birth year]  Today's Date: 05/25/2018 OT Individual Time: 6333-5456 Session 2: [id number]  OT Individual Time Calculation (min): 74 min Session 2: 27 min   Short Term Goals: Week 1:  OT Short Term Goal 1 (Week 1): Pt will complete slide board transfer to [facility] with min A OT Short Term Goal 2 (Week 1): Pt will don footwear wiht supervision OT Short Term Goal 3 (Week 1): Pt will complete clothing management during toileting wiht MIN A using lateral leans  Skilled Therapeutic Interventions/Progress Updates:    Pt supine in bed agreeable to therapy. Extensive emotional support provided by OT and rehab tech for adjustment to new amputation, d/c planning, and motivation for maximizing rehab potential and increasing self-efficacy. Session focused on ADL transfers, b/d tasks, and emotional adjustment to condition. Pt completed slide board transfer to w/c from bed with min A for lifting A and slideboard placement. Pt was transported into bathroom and set up to TTB facing out. Pt completed lateral scoot with mod A onto TTB. Pt completed UB/LB bathing seated with min A overall. Pt very fearful to lean forward from sitting. Pt transferred TTB > w/c with mod A +2 for safety. Heavy vc for weightbearing, lateral leaning, UE placement provided. Pt returned to bed to complete dressing. Min A for threading R LE d/t anxiety re forward leaning. Pt able to roll R and L to don pants over hips with min A for thoroughness. Pt completed grooming tasks seated EOB with (S). Pt was left sitting up in recliner with all needs met.    Session 2: Pt sitting up in recliner agreeable to therapy. Session focused on sit <> stand transfers and emotional support. Edu/demo provided re transfer techniques, including head-hip relationship and UE placement. Pt attempted sit to stand transfer with +2 support and was unable to clear  hips/initiate transfer. Pt became very emotional re condition, with emotional support provided to increase self-confidence and self-efficacy. Pt re-assured with review of progress toward goals so far and typical recovery. Pt attempted sit to stand again, standing with +2 max A. Pt only able to bear weight for a few seconds before returning to sitting. Pt was left sitting up in recliner with RN present.   Therapy Documentation Precautions:  Precautions Precautions: Fall Precaution Comments: telemetry Restrictions Weight Bearing Restrictions: No LLE Weight Bearing: Non weight bearing Other Position/Activity Restrictions: NWB LLE at residual limb     Pain: Pain Assessment Pain Scale: 0-10 Pain Score: 9  Pain Type: Acute pain;Surgical pain Pain Location: Leg Pain Orientation: Left Pain Descriptors / Indicators: Aching Pain Onset: On-going Pain Intervention(s): Emotional support;Ambulation/increased activity  See Function Navigator for Current Functional Status.   Therapy/Group: Individual Therapy  Curtis Sites 05/25/2018, 10:33 AM

## 2018-05-25 NOTE — Progress Notes (Signed)
Physical Therapy Session Note  Patient Details  Name: JAYRA CHOYCE MRN: [id number] Date of Birth: [birth year]  Today's Date: 05/25/2018 PT Individual Time: 9276-3943 PT Individual Time Calculation (min): 32 min   Short Term Goals: Week 1:  PT Short Term Goal 1 (Week 1): STG=LTG due to ELOS   Skilled Therapeutic Interventions/Progress Updates:   Pt asleep in her w/c, but easily awakened. PT discussed prone lying with pt, but pt stated she could not tolerate that position due to back pain.  PT educated pt on benefits of prone lying, and she stated she was willing to try it sometime.  Slide board transfer w/c> bed , level, with min/mod assist for placement of board and wt shifting.  PT re-wrapped compression wraps with pt in supine; pt assisting by flexing hip.  In R side lying, 10 x 2 L hip extension, 10 x 1 bil glut sets ; in supine, 5 x 3 R unilateral bridging (active assistive).  Pt requested lying with head at foot of bed.  Pt left resting in L side lying with alarm set, needs at hand.    Therapy Documentation Precautions:  Precautions Precautions: Fall Precaution Comments: telemetry Restrictions Weight Bearing Restrictions: No LLE Weight Bearing: Non weight bearing Other Position/Activity Restrictions: NWB LLE at residual limb     See Function Navigator for Current Functional Status.   Therapy/Group: Individual Therapy  Takesha Steger 05/25/2018, 3:36 PM
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
## 2018-05-26 ENCOUNTER — Inpatient Hospital Stay (HOSPITAL_COMMUNITY): Payer: Medicare Other | Admitting: Physical Therapy

## 2018-05-26 ENCOUNTER — Inpatient Hospital Stay (HOSPITAL_COMMUNITY): Payer: Medicare Other | Admitting: Occupational Therapy

## 2018-05-26 DIAGNOSIS — E11319 Type 2 diabetes mellitus with unspecified diabetic retinopathy without macular edema: Secondary | ICD-10-CM

## 2018-05-26 LAB — GLUCOSE, CAPILLARY
Glucose-Capillary: 155 mg/dL — ABNORMAL HIGH (ref 70–99)
Glucose-Capillary: 170 mg/dL — ABNORMAL HIGH (ref 70–99)
Glucose-Capillary: 191 mg/dL — ABNORMAL HIGH (ref 70–99)
Glucose-Capillary: 152 mg/dL — ABNORMAL HIGH (ref 70–99)

## 2018-05-26 NOTE — Progress Notes (Signed)
[facility] PHYSICAL MEDICINE & REHABILITATION     PROGRESS NOTE  Subjective/Complaints:  Pt seen sitting up at the EOB her bed this AM, eating breakfast.  He states he slept better overnight. She is willing to practice close to her her eyes and asks me to tell her what both of them are. One level is ruptured in the other one is reasons, which is upside down. Patient is not able to read either of them or for aware that one is upside down..  ROS: denies CP, SOB, nausea, vomiting, diarrhea.  Objective: Vital Signs: Blood pressure (!) 105/59, pulse 61, temperature 98.1 F (36.7 C), temperature source Oral, resp. rate 18, weight 72.1 kg (158 lb 15.2 oz), SpO2 98 %. No results found. Recent Labs    05/25/18 0532  WBC 7.8  HGB 9.5*  HCT 31.9*  PLT 262   Recent Labs    05/25/18 0532  NA 139  K 4.1  CL 101  GLUCOSE 175*  BUN 33*  CREATININE 2.99*  CALCIUM 8.8*   CBG (last 3)  Recent Labs    05/25/18 1642 05/25/18 2115 05/26/18 0654  GLUCAP 159* 239* 155*    Wt Readings from Last 3 Encounters:  05/26/18 72.1 kg (158 lb 15.2 oz)  05/22/18 74.8 kg (164 lb 14.5 oz)  04/20/18 72.6 kg (160 lb)    Physical Exam:  BP (!) 105/59 (BP Location: Right Arm)   Pulse 61   Temp 98.1 F (36.7 C) (Oral)   Resp 18   Wt 72.1 kg (158 lb 15.2 oz)   SpO2 98%   BMI 24.90 kg/m  Constitutional: She appears well-developed and well-nourished.  HENT: Normocephalic and atraumatic.  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 no JVD. Respiratory: Effort normal and breath sounds normal.  GI: Bowel sounds are normal. She exhibits no distension.  Musculoskeletal: Left stump and [name] edema  Neurological: She is alert and oriented.  Motor: B/l UE 4+/5 proximal to distal LLE: HF 4-/5 (pain inhibition, stable) [name]: HF 4-/5, KE 4-/5, ADF 4+/5 (some pain inhibition) Sensation diminished to light touch [name]  Skin: Amputee site with staples c/d/i  Psychiatric: She has a normal mood and  affect. Her behavior is normal.    Assessment/Plan: 1. Functional deficits secondary to left AKA which require 3+ hours per day of interdisciplinary therapy in a comprehensive inpatient rehab setting. Physiatrist is providing close team supervision and 24 hour management of active medical problems listed below. Physiatrist and rehab team continue to assess barriers to discharge/monitor patient progress toward functional and medical goals.  Function:  Bathing Bathing position   Position: Shower  Bathing parts Body parts bathed by patient: Right arm, Left arm, Chest, Abdomen, Front perineal area, Buttocks, Right upper leg, Left upper leg, Right lower leg Body parts bathed by helper: Back  Bathing assist Assist Level: Touching or steadying assistance(Pt > 75%)      Upper Body Dressing/Undressing Upper body dressing   What is the patient wearing?: Pull over shirt/dress     Pull over shirt/dress - Perfomed by patient: Thread/unthread right sleeve, Thread/unthread left sleeve, Put head through opening, Pull shirt over trunk          Upper body assist Assist Level: Set up      Lower Body Dressing/Undressing Lower body dressing   What is the patient wearing?: Pants, Non-skid slipper socks, Underwear, Ted Hose Underwear - Performed by patient: Thread/unthread right underwear leg, Thread/unthread left underwear leg, Pull underwear up/down  Pants- Performed by patient: Thread/unthread left pants leg, Pull pants up/down Pants- Performed by helper: Thread/unthread right pants leg   Non-skid slipper socks- Performed by helper: Don/doff right sock               TED Hose - Performed by helper: Don/doff right TED hose  Lower body assist Assist for lower body dressing: Touching or steadying assistance (Pt > 75%)      Toileting Toileting Toileting activity did not occur: No continent bowel/bladder event Toileting steps completed by patient: Adjust clothing prior to toileting, Adjust  clothing after toileting Toileting steps completed by helper: Adjust clothing prior to toileting, Performs perineal hygiene, Adjust clothing after toileting(per Haskell Flirt Aquit, NT report)    Toileting assist Assist level: Touching or steadying assistance (Pt.75%)   Transfers Chair/bed transfer   Chair/bed transfer method: Lateral scoot Chair/bed transfer assist level: Touching or steadying assistance (Pt > 75%) Chair/bed transfer assistive device: Sliding board, Armrests     Locomotion Ambulation Ambulation activity did not occur: Safety/medical concerns         Wheelchair   Type: Manual Max wheelchair distance: 150 ft Assist Level: Supervision or verbal cues  Cognition Comprehension Comprehension assist level: Understands complex 90% of the time/cues 10% of the time  Expression Expression assist level: Expresses basic needs/ideas: With no assist  Social Interaction Social Interaction assist level: Interacts appropriately 90% of the time - Needs monitoring or encouragement for participation or interaction.  Problem Solving Problem solving assist level: Solves basic problems with no assist  Memory Memory assist level: More than reasonable amount of time    Medical Problem List and Plan: 1.  Decreased functional mobility secondary to left AKA 0/07/6044 complicated by septic shock and AKI  Cont CIR 2.  DVT Prophylaxis/Anticoagulation: Subcutaneous heparin 3. Pain Management: Oxycodone as needed 4. Mood: Provide emotional support 5. Neuropsych: This patient is capable of making decisions on her own behalf. 6. Skin/Wound Care/sacral decubitus: Routine skin checks and follow-up per WOC for wound recommendations 7. Fluids/Electrolytes/Nutrition: Routine in and outs 8.  AKI in setting of septic shock consistent with ischemic ATN.  Follow-up per renal services.  No indication for hemodialysis at this time.  Baseline creatinine was 0.9-1  Cr 2.99 on 7/8  Cont to monitor 9.   NSTEMI/atrial fibrillation with RVR.  Discussed plan for cardiac catheterization once renal function stabilized.  Presently on amiodarone 200 mg daily, Lopressor 12.5 mg twice daily.  No plan to resume Xarelto at this time 10.  Diastolic congestive heart failure.  Monitor for any signs of fluid overload Filed Weights   05/24/18 0500 05/25/18 0500 05/26/18 0607  Weight: 73.1 kg (161 lb 2.5 oz) 71.6 kg (157 lb 13.6 oz) 72.1 kg (158 lb 15.2 oz)   Stable on 7/9 11.  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  Latest hemoglobin A1c 7.2.  SSI.    Patient on Humalog 10 units 3 times daily  Lantus insulin 36 units twice daily prior to admission.    Lantus 10 BID started on 7/8  Elevated on 7/9, will consider further increase tomorrow 12.  Acute on chronic anemia.  Ferrous sulfate 325 mg twice daily.    Hb 9.5 on 7/8  Cont to monitor 13.  Hyperlipidemia.  Crestor 14.  GERD.  Protonix 15. Hypoalbuminemia  Supplement initiated on 7/8  LOS (Days) 4 A FACE TO FACE EVALUATION WAS PERFORMED  Ankit Lorie Phenix 05/26/2018 7:48 AM

## 2018-05-26 NOTE — Care Management (Signed)
[facility] Individual Statement of Services  Patient Name:  KRISTINIA LEAVY  Date:  05/26/2018  Welcome to the [facility].  Our goal is to provide you with an individualized program based on your diagnosis and situation, designed to meet your specific needs.  With this comprehensive rehabilitation program, you will be expected to participate in at least 3 hours of rehabilitation therapies Monday-Friday, with modified therapy programming on the weekends.  Your rehabilitation program will include the following services:  Physical Therapy (PT), Occupational Therapy (OT), 24 hour per day rehabilitation nursing, Therapeutic Recreaction (TR), Neuropsychology, Case Management (Social Worker), Rehabilitation Medicine, Nutrition Services and Pharmacy Services  Weekly team conferences will be held on Wednesdays to discuss your progress.  Your Social Worker will talk with you frequently to get your input and to update you on team discussions.  Team conferences with you and your family in attendance may also be held.  Expected length of stay: 9-12 days   Overall anticipated outcome: independent to minimal assistance  Depending on your progress and recovery, your program may change. Your Social Worker will coordinate services and will keep you informed of any changes. Your Social Worker's name and contact numbers are listed  below.  The following services may also be recommended but are not provided by the [facility] will be made to provide these services after discharge if needed.  Arrangements include referral to agencies that provide these services.  Your insurance has been verified to be:  [facility] Medicare Your primary doctor is:  [facility]  Pertinent information will be shared with your doctor and your  insurance company.  Social Worker:  [place], [place] or ([phone]   Information discussed with and copy given to patient by: Lennart Pall, 05/26/2018, 3:31 PM

## 2018-05-26 NOTE — Progress Notes (Signed)
Physical Therapy Session Note  Patient Details  Name: [name] MRN: [id number] Date of Birth: [birth year]  Today's Date: 05/26/2018 PT Individual Time: 1300-1415 PT Individual Time Calculation (min): 75 min   Short Term Goals: Week 1:  PT Short Term Goal 1 (Week 1): STG=LTG due to ELOS   Skilled Therapeutic Interventions/Progress Updates: Pt received seated in recliner, c/o pain as below and agreeable to treatment. Lateral scoot transfer recliner >w/c<>mat table with transfer board and min guard, assist to stabilize board during transfer. Sit <>supine minA; therapist re-wrapped L residual limb while pt performed glute sets x15 reps. Sit <>stand in stedy modA from elevated mat table with assist for anterior weight shift to initiate. Min guard balance with tolerance max 3 min. Mat lowered by 2" for last sit >stand trial, continues to require modA. Semi-reclined sit ups 1x10 reps; limited by pain in sacral wound. Sidelying L hip flexor stretch 2x1 min PROM by therapist; educated pt re: hip flexor length and need for stretching to maintain and prepare for prosthetic fitting. Side lying hip extension and abduction 2x10 reps each. Trialed sidelying sleeping positions on both side d/t pt report of discomfort at night; benefits from pillow under top knee (2 under L residual in R sidelying, 1 under R knee in L sidelying). Returned to w/c and w/c >bed with transfer board with S. Remained supine in bed, alarm intact, husband present and all needs in reach.      Therapy Documentation Precautions:  Precautions Precautions: Fall Precaution Comments: telemetry Restrictions Weight Bearing Restrictions: No LLE Weight Bearing: Non weight bearing Other Position/Activity Restrictions: NWB LLE at residual limb Pain: Pain Assessment Pain Scale: 0-10 Pain Score: 8  Pain Type: Chronic pain Pain Location: Back Pain Descriptors / Indicators: Aching Pain Onset: On-going Pain Intervention(s): Medication (See  eMAR)   See Function Navigator for Current Functional Status.   Therapy/Group: Individual Therapy  Luberta Mutter 05/26/2018, 2:04 PM

## 2018-05-26 NOTE — Progress Notes (Signed)
Patient voided in bedpan this afternoon and assisted rolling off/on in bed. Right after patient had one episode of vomiting. Patient denied any nausea and reported it "came on fast". Afterwards patient denied any nausea. Emesis large amount reddish/purple food particles (patient reports having red velvet cake for lunch). Patient cleaned up and resting in bed. Vitals stable. Continue to monitor.

## 2018-05-26 NOTE — Progress Notes (Signed)
Occupational Therapy Session Note  Patient Details  Name: [name] MRN: [id number] Date of Birth: Nov 08, 1966  Today's Date: 05/26/2018 OT Individual Time: 1500-1557 OT Individual Time Calculation (min): 57 min    Short Term Goals: Week 1:  OT Short Term Goal 1 (Week 1): Pt will complete slide board transfer to [facility] with min A OT Short Term Goal 2 (Week 1): Pt will don footwear wiht supervision OT Short Term Goal 3 (Week 1): Pt will complete clothing management during toileting wiht MIN A using lateral leans  Skilled Therapeutic Interventions/Progress Updates:    Treatment session with focus on functional transfers and problem solving toileting needs.  Pt reports feeling like she will need to have a BM this afternoon, but unsure when.  Engaged in discussion of bathroom setup and DME needs.  Pt and husband reporting interested in a different BSC and maybe even a new seat for shower.  Discussed various drop arm BSCs and pt reports depending on type and price of BSC she may either also purchase a shower seat/bench or use BSC for both toileting and bathing.  Also discussed padded tub bench with cut out as an option, recommending pt utilize her resources to identify DME that will be most useful and most safe for her.  Completed transfer bed > w/c with slide board min assist with therapist placing slide board and pt double checking positioning.  Pt propelled w/c ~100' with supervision, therapist pushed pt remainder of way to ADL apt bathroom.  Completed slide board transfer w/c > standard drop arm BSC with min assist to/from commode but increased time for positioning due to precarious positioning with narrow seat rim on particular BSC.  Encouraged pt to attempt various BSCs with therapy to increase independence with transfers and problem solve most appropriate setup.  Pt reports fatigue and requesting to return to room.  Pt propelled ~100' with therapist pushing remainder of way.  Min guard slide board  transfer back to bed.  Therapy Documentation Precautions:  Precautions Precautions: Fall Precaution Comments: telemetry Restrictions Weight Bearing Restrictions: No LLE Weight Bearing: Non weight bearing Other Position/Activity Restrictions: NWB LLE at residual limb General:   Vital Signs: Therapy Vitals Temp: 98.7 F (37.1 C) Temp Source: Oral Pulse Rate: 66 Resp: 15 BP: (!) 106/53 Patient Position (if appropriate): Sitting Oxygen Therapy SpO2: 97 % O2 Device: Room Air Pain: Pain Assessment Pain Score: 8   See Function Navigator for Current Functional Status.   Therapy/Group: Individual Therapy  Simonne Come 05/26/2018, 4:03 PM

## 2018-05-26 NOTE — Progress Notes (Addendum)
Physical Therapy Session Note  Patient Details  Name: [name] MRN: [id number] Date of Birth: 10-10-1966  Today's Date: 05/26/2018 PT Individual Time: 0917-1030 PT Individual Time Calculation (min): 73 min   Short Term Goals: Week 1:  PT Short Term Goal 1 (Week 1): STG=LTG due to ELOS   Skilled Therapeutic Interventions/Progress Updates: Pt presented sitting EOB with nsg present agreeable to therapy. Per pt just received pain meds but still have 10/10 pain at back/buttock. Per pt nsg just wrapped leg and pt states is knowledgeable in wrapping. Pt performed SB transfer to R with increased time and min guard. Pt propelled to rehab gym with increased time and performed SB transfer to R min guard. Participated in seated and supine LE activities including [name] LAQ with 2# cuff 2x10, and AROM hip flexion LLE 2x10. Pt transitioned to supine with min guard and performed [name] SAQ, AA SLR from elevated level, LLE hip flexion, sidelying hip abd and hip ext all performed 2 x 10. Pt required increased time as became weepy during session and emotional support provided. Pt returned to supine with minA with PTA provided [facility] assist for sequencing and physical assist for truncal support. Pt demonstrated overall supervision level with w/c management as pt was able to release hand rail, and breaks with physical assist. Pt required minA for SB placement and was able to perform SB transfer to L to return to w/c. Pt transported to day room and participated in UBE L1 2 min forward, 1 min backwards for BUE strengthening and endurance. Pt propelled back to room and performed lateral scoot transfer to recliner with minA as pt unable to clear w/c due to increased fatigue. Noted that wrapping has slipped down pt requesting that nsg be advised to re-wrap residual limb. Pt positioned for comfort and left in recliner with call bell within reach and needs met.      Therapy Documentation Precautions:  Precautions Precautions:  Fall Precaution Comments: telemetry Restrictions Weight Bearing Restrictions: No LLE Weight Bearing: Non weight bearing Other Position/Activity Restrictions: NWB LLE at residual limb General:   Vital Signs:  Pain: Pain Assessment Pain Scale: 0-10 Pain Score: 8  Pain Type: Chronic pain Pain Location: Back Pain Descriptors / Indicators: Aching Pain Onset: On-going Pain Intervention(s): Medication (See eMAR)  See Function Navigator for Current Functional Status.   Therapy/Group: Individual Therapy  Amayah Staheli  Nathasha Fiorillo, PTA  05/26/2018, 12:23 PM

## 2018-05-27 ENCOUNTER — Inpatient Hospital Stay (HOSPITAL_COMMUNITY): Payer: Medicare Other

## 2018-05-27 ENCOUNTER — Inpatient Hospital Stay (HOSPITAL_COMMUNITY): Payer: Medicare Other | Admitting: Physical Therapy

## 2018-05-27 ENCOUNTER — Inpatient Hospital Stay (HOSPITAL_COMMUNITY): Payer: Medicare Other | Admitting: Occupational Therapy

## 2018-05-27 DIAGNOSIS — R0989 Other specified symptoms and signs involving the circulatory and respiratory systems: Secondary | ICD-10-CM

## 2018-05-27 LAB — GLUCOSE, CAPILLARY
GLUCOSE-CAPILLARY: 193 mg/dL — AB (ref 70–99)
Glucose-Capillary: 135 mg/dL — ABNORMAL HIGH (ref 70–99)
Glucose-Capillary: 162 mg/dL — ABNORMAL HIGH (ref 70–99)
Glucose-Capillary: 174 mg/dL — ABNORMAL HIGH (ref 70–99)

## 2018-05-27 NOTE — Progress Notes (Signed)
Physical Therapy Session Note  Patient Details  Name: [name] MRN: [id number] Date of Birth: 1966/05/09  Today's Date: 05/27/2018 PT Individual Time:1300-1400   60 min    Short Term Goals: Week 1:  PT Short Term Goal 1 (Week 1): STG=LTG due to ELOS   Skilled Therapeutic Interventions/Progress Updates:   Pt received sitting in recliner and agreeable to PT. SB transfer to [facility] with set up assist from PT. Min cues for proper UE placement and increased gluteal clearance to reduce stress on skin breakdown.   WC mobility through hall of hospital x 259ft with distant supervision assist from PT.   Sit<>stand in parallel bars x 4 with mod assist from PT. Standing tolerance with min assist 4 x 1 min with BUE support on parallel bars and min assist from PT.   Press ups with UE on arm rest x 8, prolonged rest breaks between trials due to shoulder pain and fatigue.    PT isntructed pt in BUE HEP with level 2 tband: tricep extension, chest press, Lat pull down, mid row to strengthen MM associated with UE stability to push to standing position.       Therapy Documentation Precautions:  Precautions Precautions: Fall Precaution Comments: telemetry Restrictions Weight Bearing Restrictions: No LLE Weight Bearing: Non weight bearing Other Position/Activity Restrictions: NWB LLE at residual limb :  Pain: Pain Assessment Pain Score: 3    See Function Navigator for Current Functional Status.   Therapy/Group: Individual Therapy  Lorie Phenix 05/27/2018, 1:26 PM

## 2018-05-27 NOTE — Progress Notes (Signed)
[place] PHYSICAL MEDICINE & REHABILITATION     PROGRESS NOTE  Subjective/Complaints:  Pt seen laying in bed this AM.  She states she slept much better overnight and feels much better this AM.    ROS: Denies CP, SOB, nausea, vomiting, diarrhea.  Objective: Vital Signs: Blood pressure (!) 101/51, pulse 68, temperature 98.2 F (36.8 C), resp. rate 18, weight 71.2 kg (157 lb), SpO2 94 %. No results found. Recent Labs    05/25/18 0532  WBC 7.8  HGB 9.5*  HCT 31.9*  PLT 262   Recent Labs    05/25/18 0532  NA 139  K 4.1  CL 101  GLUCOSE 175*  BUN 33*  CREATININE 2.99*  CALCIUM 8.8*   CBG (last 3)  Recent Labs    05/26/18 1704 05/26/18 2105 05/27/18 0618  GLUCAP 191* 152* 135*    Wt Readings from Last 3 Encounters:  05/27/18 71.2 kg (157 lb)  05/22/18 74.8 kg (164 lb 14.5 oz)  04/20/18 72.6 kg (160 lb)    Physical Exam:  BP (!) 101/51 (BP Location: Right Arm)   Pulse 68   Temp 98.2 F (36.8 C)   Resp 18   Wt 71.2 kg (157 lb)   SpO2 94%   BMI 24.59 kg/m  Constitutional: She appears well-developed and well-nourished.  HENT: Normocephalic and atraumatic.  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 No JVD. Respiratory: Effort normal and breath sounds normal.  GI: Bowel sounds are normal. She exhibits no distension.  Musculoskeletal: Left stump and [name] edema  Neurological: She is alert and oriented.  Motor: B/l UE 4+/5 proximal to distal LLE: HF 4/5 (pain inhibition) [name]: HF 4/5, KE 4/5, ADF 4+/5 (some pain inhibition) Sensation diminished to light touch [name]  Skin: Amputee site with staples c/d/i  Psychiatric: She has a normal mood and affect. Her behavior is normal.    Assessment/Plan: 1. Functional deficits secondary to left AKA which require 3+ hours per day of interdisciplinary therapy in a comprehensive inpatient rehab setting. Physiatrist is providing close team supervision and 24 hour management of active medical problems listed  below. Physiatrist and rehab team continue to assess barriers to discharge/monitor patient progress toward functional and medical goals.  Function:  Bathing Bathing position   Position: Shower  Bathing parts Body parts bathed by patient: Right arm, Left arm, Chest, Abdomen, Front perineal area, Buttocks, Right upper leg, Left upper leg, Right lower leg Body parts bathed by helper: Back  Bathing assist Assist Level: Touching or steadying assistance(Pt > 75%)      Upper Body Dressing/Undressing Upper body dressing   What is the patient wearing?: Pull over shirt/dress     Pull over shirt/dress - Perfomed by patient: Thread/unthread right sleeve, Thread/unthread left sleeve, Put head through opening, Pull shirt over trunk          Upper body assist Assist Level: Set up      Lower Body Dressing/Undressing Lower body dressing   What is the patient wearing?: Pants, Non-skid slipper socks, Underwear, [employer]  - Performed by patient: Thread/unthread right underwear leg, Thread/unthread left underwear leg, Pull underwear up/down   Pants- Performed by patient: Thread/unthread left pants leg, Pull pants up/down Pants- Performed by helper: Thread/unthread right pants leg   Non-skid slipper socks- Performed by helper: Don/doff right sock               TED Hose - Performed by helper: Don/doff right TED hose  Lower body assist Assist for  lower body dressing: Touching or steadying assistance (Pt > 75%)      Toileting Toileting Toileting activity did not occur: No continent bowel/bladder event Toileting steps completed by patient: Adjust clothing prior to toileting, Adjust clothing after toileting Toileting steps completed by helper: Adjust clothing prior to toileting, Performs perineal hygiene, Adjust clothing after toileting(per [place], NT report)    Toileting assist Assist level: Touching or steadying assistance (Pt.75%)   Transfers Chair/bed transfer    Chair/bed transfer method: Lateral scoot Chair/bed transfer assist level: Touching or steadying assistance (Pt > 75%) Chair/bed transfer assistive device: Sliding board, Armrests     Locomotion Ambulation Ambulation activity did not occur: Safety/medical concerns         Wheelchair   Type: Manual Max wheelchair distance: 150 ft Assist Level: Supervision or verbal cues  Cognition Comprehension Comprehension assist level: Understands complex 90% of the time/cues 10% of the time  Expression Expression assist level: Expresses basic needs/ideas: With no assist  Social Interaction Social Interaction assist level: Interacts appropriately 90% of the time - Needs monitoring or encouragement for participation or interaction.  Problem Solving Problem solving assist level: Solves basic problems with no assist  Memory Memory assist level: More than reasonable amount of time    Medical Problem List and Plan: 1.  Decreased functional mobility secondary to left AKA 03/23/3892 complicated by septic shock and AKI  Cont CIR 2.  DVT Prophylaxis/Anticoagulation: Subcutaneous heparin 3. Pain Management: Oxycodone as needed 4. Mood: Provide emotional support 5. Neuropsych: This patient is capable of making decisions on her own behalf. 6. Skin/Wound Care/sacral decubitus: Routine skin checks and follow-up per WOC for wound recommendations 7. Fluids/Electrolytes/Nutrition: Routine in and outs 8.  AKI in setting of septic shock consistent with ischemic ATN.  Follow-up per renal services.  No indication for hemodialysis at this time.  Baseline creatinine was 0.9-1  Cr 2.99 on 7/8  Cont to monitor 9.  NSTEMI/atrial fibrillation with RVR.  Discussed plan for cardiac catheterization once renal function stabilized.  Presently on amiodarone 200 mg daily, Lopressor 12.5 mg twice daily.  No plan to resume Xarelto at this time 10.  Diastolic congestive heart failure.  Monitor for any signs of fluid overload Filed  Weights   05/25/18 0500 05/26/18 0607 05/27/18 0506  Weight: 71.6 kg (157 lb 13.6 oz) 72.1 kg (158 lb 15.2 oz) 71.2 kg (157 lb)   Stable on 7/10 11.  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  Latest hemoglobin A1c 7.2.  SSI.    Patient on Humalog 10 units 3 times daily  Lantus insulin 36 units twice daily prior to admission.    Lantus 10 BID started on 7/8  Elevated on 7/10, but improving, will consider further increase tomorrow if necessary 12.  Acute on chronic anemia.  Ferrous sulfate 325 mg twice daily.    Hb 9.5 on 7/8  Cont to monitor 13.  Hyperlipidemia.  Crestor 14.  GERD.  Protonix 15. Hypoalbuminemia  Supplement initiated on 7/8 16. Blood pressure  Labile on 7/10  LOS (Days) 5 A FACE TO FACE EVALUATION WAS PERFORMED  Jaselynn Tamas Lorie Phenix 05/27/2018 7:56 AM

## 2018-05-27 NOTE — Progress Notes (Signed)
Physical Therapy Session Note  Patient Details  Name: [name] MRN: [id number] Date of Birth: 09/17/1966  Today's Date: 05/27/2018 PT Individual Time: 0459-9774 PT Individual Time Calculation (min): 80 min   Short Term Goals: Week 1:  PT Short Term Goal 1 (Week 1): STG=LTG due to ELOS  Week 2:     Skilled Therapeutic Interventions/Progress Updates:   Shrinker sock has not been delivered yet.   Pt supine in bed, head at foot of bed.  No compression wrap on residual limb. Pt stated it came off during the night.  Bed mobility using bed features for rolling and scooting to assist with donning panties.  PT wrapped residual limb with 2 4" ACEs and 1 6" ACE including spica around waist. PT donned TED [name].  Pt sat EOB.  She verbalized slide board transfer to R, with min cues.  Min assist for set-up and wt shifting to clear buttocks from wheel.   W/c propulsion on level tile x 150' x 2 on level tile with turns slowly with supervision. Propulsion through an obstacle course of 5 chairs, bumping 2/5 1st trial, 0/5 chairs 2nd trial.   Strengthening ex using Kinetron in sitting in w/c: focusing on quadriceps at resistance 60 cm/sec, x 20 cycles with effort; focusing on gluteals in slight trunk flexion, at 70 cm/sec x 20 cycles x 2.  Pt requesting sitting up in recliner to eat her muffin.  She does not like the hospital breakfast and informed this PT at the end of the session that she probably would have had more strength if she had eaten the muffin before the session. PT educated pt on need to eat a reasonable amount before the first tx of the day, and work with dietary staff to find foods she likes.   [place] cushion placed in Psychologist, occupational.  Slide board transfer downhill to recliner using slideboard, to R, min assist after set-up. Pt lefft resting in recliner with needs at hand.  PT requested Haylee, NT recline it after pt has finished eating, for [name] edema mgt and to decrease pressure on wound on  buttock.    Therapy Documentation Precautions:  Precautions Precautions: Fall Precaution Comments: telemetry Restrictions Weight Bearing Restrictions: No LLE Weight Bearing: Non weight bearing Other Position/Activity Restrictions: NWB LLE at residual limb  Pain: 9/10 low back, premedicated. See Function Navigator for Current Functional Status.   Therapy/Group: Individual Therapy  Criss Pallone 05/27/2018, 10:53 AM

## 2018-05-27 NOTE — Progress Notes (Signed)
Occupational Therapy Session Note  Patient Details  Name: [name] MRN: [id number] Date of Birth: 08-20-1966  Today's Date: 05/27/2018 OT Individual Time: 1445-1601 OT Individual Time Calculation (min): 76 min    Short Term Goals: Week 1:  OT Short Term Goal 1 (Week 1): Pt will complete slide board transfer to [facility] with min A OT Short Term Goal 2 (Week 1): Pt will don footwear wiht supervision OT Short Term Goal 3 (Week 1): Pt will complete clothing management during toileting wiht MIN A using lateral leans  Skilled Therapeutic Interventions/Progress Updates:    Upon entering the room, pt seated in wheelchair with no c/o pain and agreeable to OT intervention. Pt very tearful and "feeling down" initially. Pt agreeable to go outside. Pt propelled wheelchair inside 150' with supervision and increased time in order to increase B UE strengthening and endurance. OT assisted pt the rest of the way outside and pt taking seated rest break. OT educated and demonstrated B UE strengthening exercises with use of level 2 resistive theraband. Pt returning demonstrations with min verbal cues for proper technique. Pt performed 3 sets of 10 chest pulls, shoulder elevation, shoulder diagonals, bicep curls, and alternating punches. Pt needing rest breaks secondary to fatigue but very motivated with therapeutic exercise. Pt propelling wheelchair outside on uneven surfaces but having increased difficulty with uphill slopes and needing min A to manage. Pt assisting pt back to room at end of session and pt transferred from wheelchair >recliner chair with OT placing slide board and pt checking position. Min A for slide board to recliner chair. Pt seated in recliner with call bell and all needed items within reach upon exiting the room.   Therapy Documentation Precautions:  Precautions Precautions: Fall Precaution Comments: telemetry Restrictions Weight Bearing Restrictions: No LLE Weight Bearing: Non weight  bearing Other Position/Activity Restrictions: NWB LLE at residual limb Vital Signs: Therapy Vitals Temp: 97.9 F (36.6 C) Pulse Rate: 62 Resp: 18 BP: (!) 116/51 Patient Position (if appropriate): Sitting Oxygen Therapy SpO2: 100 % O2 Device: Room Air  See Function Navigator for Current Functional Status.   Therapy/Group: Individual Therapy  Gypsy Decant 05/27/2018, 4:14 PM

## 2018-05-28 ENCOUNTER — Inpatient Hospital Stay (HOSPITAL_COMMUNITY): Payer: Medicare Other | Admitting: Physical Therapy

## 2018-05-28 ENCOUNTER — Inpatient Hospital Stay (HOSPITAL_COMMUNITY): Payer: Medicare Other | Admitting: Occupational Therapy

## 2018-05-28 DIAGNOSIS — D638 Anemia in other chronic diseases classified elsewhere: Secondary | ICD-10-CM

## 2018-05-28 LAB — GLUCOSE, CAPILLARY
GLUCOSE-CAPILLARY: 192 mg/dL — AB (ref 70–99)
Glucose-Capillary: 133 mg/dL — ABNORMAL HIGH (ref 70–99)
Glucose-Capillary: 166 mg/dL — ABNORMAL HIGH (ref 70–99)
Glucose-Capillary: 146 mg/dL — ABNORMAL HIGH (ref 70–99)

## 2018-05-28 MED ORDER — INSULIN GLARGINE 100 UNIT/ML ~~LOC~~ SOLN
12.0000 [IU] | Freq: Two times a day (BID) | SUBCUTANEOUS | Status: DC
  Administered 2018-05-28 – 2018-05-30 (×5): 12 [IU] via SUBCUTANEOUS
  Filled 2018-05-28 (×5): qty 0.12

## 2018-05-28 NOTE — Progress Notes (Signed)
[place] PHYSICAL MEDICINE & REHABILITATION     PROGRESS NOTE  Subjective/Complaints:  Patient seen sitting up at the edge of her bed eating breakfast this morning. She states she slept even better last night and feels even better this morning. She states she is very excited and when asked why he states because she is being discharged on Saturday. When informed that she is actually being discharged on next Wednesdayshe breaks down and cries uncontrollably. Attempted to reassure and comfort patient but, patient remained very upset about having to stay in the hospital longer.  ROS: denies CP, SOB, nausea, vomiting, diarrhea.  Objective: Vital Signs: Blood pressure (!) 126/52, pulse 72, temperature 98 F (36.7 C), temperature source Oral, resp. rate 18, weight 71.2 kg (157 lb), SpO2 100 %. No results found. No results for input(s): WBC, HGB, HCT, PLT in the last 72 hours. No results for input(s): NA, K, CL, GLUCOSE, BUN, CREATININE, CALCIUM in the last 72 hours.  Invalid input(s): CO CBG (last 3)  Recent Labs    05/27/18 1655 05/27/18 2104 05/28/18 0628  GLUCAP 174* 193* 133*    Wt Readings from Last 3 Encounters:  05/27/18 71.2 kg (157 lb)  05/22/18 74.8 kg (164 lb 14.5 oz)  04/20/18 72.6 kg (160 lb)    Physical Exam:  BP (!) 126/52 (BP Location: Right Arm)   Pulse 72   Temp 98 F (36.7 C) (Oral)   Resp 18   Wt 71.2 kg (157 lb)   SpO2 100%   BMI 24.59 kg/m  Constitutional: She appears well-developed and well-nourished.  HENT: Normocephalic and atraumatic.  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 No JVD. Respiratory: Effort normal and breath sounds normal.  GI: Bowel sounds are normal. She exhibits no distension.  Musculoskeletal: Left stump and [name] edema  Neurological: She is alert and oriented.  Motor: B/l UE 4+/5 proximal to distal LLE: HF 4/5 (pain inhibition, improving) [name]: HF 4/5, KE 4/5, ADF 4+/5 (some pain inhibition, improving) Sensation  diminished to light touch [name]  Skin: Amputee site with staples c/d/i  Psychiatric: She has a normal mood and affect. Her behavior is normal.    Assessment/Plan: 1. Functional deficits secondary to left AKA which require 3+ hours per day of interdisciplinary therapy in a comprehensive inpatient rehab setting. Physiatrist is providing close team supervision and 24 hour management of active medical problems listed below. Physiatrist and rehab team continue to assess barriers to discharge/monitor patient progress toward functional and medical goals.  Function:  Bathing Bathing position   Position: Shower  Bathing parts Body parts bathed by patient: Right arm, Left arm, Chest, Abdomen, Front perineal area, Buttocks, Right upper leg, Left upper leg, Right lower leg Body parts bathed by helper: Back  Bathing assist Assist Level: Touching or steadying assistance(Pt > 75%)      Upper Body Dressing/Undressing Upper body dressing   What is the patient wearing?: Pull over shirt/dress     Pull over shirt/dress - Perfomed by patient: Thread/unthread right sleeve, Thread/unthread left sleeve, Put head through opening, Pull shirt over trunk          Upper body assist Assist Level: Set up      Lower Body Dressing/Undressing Lower body dressing   What is the patient wearing?: Pants, Non-skid slipper socks, Underwear, [employer]  - Performed by patient: Thread/unthread right underwear leg, Thread/unthread left underwear leg, Pull underwear up/down   Pants- Performed by patient: Thread/unthread left pants leg, Pull pants up/down Pants- Performed  by helper: Thread/unthread right pants leg   Non-skid slipper socks- Performed by helper: Don/doff right sock               TED Hose - Performed by helper: Don/doff right TED hose  Lower body assist Assist for lower body dressing: Touching or steadying assistance (Pt > 75%)      Toileting Toileting Toileting activity did not occur: No  continent bowel/bladder event Toileting steps completed by patient: Adjust clothing prior to toileting, Adjust clothing after toileting Toileting steps completed by helper: Adjust clothing prior to toileting, Performs perineal hygiene, Adjust clothing after toileting(per [place], NT report)    Toileting assist Assist level: Touching or steadying assistance (Pt.75%)   Transfers Chair/bed transfer   Chair/bed transfer method: Lateral scoot Chair/bed transfer assist level: Touching or steadying assistance (Pt > 75%) Chair/bed transfer assistive device: Sliding board, Armrests     Locomotion Ambulation Ambulation activity did not occur: Safety/medical concerns         Wheelchair   Type: Manual Max wheelchair distance: 150' Assist Level: Supervision or verbal cues  Cognition Comprehension Comprehension assist level: Follows complex conversation/direction with extra time/assistive device  Expression Expression assist level: Expresses basic needs/ideas: With no assist  Social Interaction Social Interaction assist level: Interacts appropriately 90% of the time - Needs monitoring or encouragement for participation or interaction.  Problem Solving Problem solving assist level: Solves basic problems with no assist  Memory Memory assist level: More than reasonable amount of time    Medical Problem List and Plan: 1.  Decreased functional mobility secondary to left AKA 11/27/5091 complicated by septic shock and AKI  Cont CIR 2.  DVT Prophylaxis/Anticoagulation: Subcutaneous heparin 3. Pain Management: Oxycodone as needed 4. Mood: Provide emotional support 5. Neuropsych: This patient is capable of making decisions on her own behalf. 6. Skin/Wound Care/sacral decubitus: Routine skin checks and follow-up per WOC for wound recommendations 7. Fluids/Electrolytes/Nutrition: Routine in and outs 8.  AKI in setting of septic shock consistent with ischemic ATN.  Follow-up per renal services.   No indication for hemodialysis at this time.  Baseline creatinine was 0.9-1  Cr 2.99 on 7/8  Labs ordered for tomorrow  Cont to monitor 9.  NSTEMI/atrial fibrillation with RVR.  Discussed plan for cardiac catheterization once renal function stabilized.  Presently on amiodarone 200 mg daily, Lopressor 12.5 mg twice daily.  No plan to resume Xarelto at this time 10.  Diastolic congestive heart failure.  Monitor for any signs of fluid overload Filed Weights   05/25/18 0500 05/26/18 0607 05/27/18 0506  Weight: 71.6 kg (157 lb 13.6 oz) 72.1 kg (158 lb 15.2 oz) 71.2 kg (157 lb)   Stable on 7/11 11.  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  Latest hemoglobin A1c 7.2.  SSI.    Patient on Humalog 10 units 3 times daily  Lantus insulin 36 units twice daily prior to admission.    Lantus 10 BID started on 7/8, increased to 12 on 7/11  Elevated on 7/10, but improving, will consider further increase tomorrow if necessary 12.  Acute on chronic anemia.  Ferrous sulfate 325 mg twice daily.    Hb 9.5 on 7/8  Labs ordered for tomorrow  Cont to monitor 13.  Hyperlipidemia.  Crestor 14.  GERD.  Protonix 15. Hypoalbuminemia  Supplement initiated on 7/8 16. Blood pressure  Controlled on 7/11  LOS (Days) 6 A FACE TO FACE EVALUATION WAS PERFORMED  Hanford Lust Lorie Phenix 05/28/2018 8:13 AM

## 2018-05-28 NOTE — Progress Notes (Signed)
Physical Therapy Session Note  Patient Details  Name: [name] MRN: [id number] Date of Birth: March 09, 1966  Today's Date: 05/28/2018 PT Individual Time:1000-1100 AND 1500-1615   60 min and 75 min   Short Term Goals: Week 1:  PT Short Term Goal 1 (Week 1): STG=LTG due to ELOS   Skilled Therapeutic Interventions/Progress Updates:  session 1  Pt received supine in bed and agreeable to PT. Supine>sit transfer with out assist or cues. Pt instructed in lower body dressing task to don underpants and pants with supervision assist for set up and safety. PT wrapped LLE for lib shaping and edema control.   SB transfer to [facility] with supervision assist from PT with moderate cues for board placement and safety awareness of WC. WC mobility through hall of rehab unit with supervision assist for direction  X 245f.   Car transfer to sedan height with set up assist from PT and min cues for safety and AD management. Education to pt for need to d/c into sedan for improved safety and access to car.   BUE 2 min forward/2 min reverse, level 6.5 with min cues for improved speed to maximize cardiorespiratory benefits.   Pt noted to be emotionally distressed this AM. Emotional support provided by PT as well as education to pt on d/c date and justification for treatment plan to allow greatest functional gain prior to d/c. Patient returned to [facility] and [facility] and all needs met.     Session 2.   Pt received sitting in WC and agreeable to PT. Pt and husband educate in set up for transfers, to car as well as [facility] lateral scoot transfer. Cues for proper set up and safety awareness for pt and husband due to pt's visual deficits. .   Bed mobility for sit>supine with supervision assist. Supine and sidelying therex: hip extension, hip abduction, isometric hip extension, mini bridge to push through the LLE and [name]. Each completed 2 x 12 BLE. Supine>sit following  therex with min assist due to abdominal pressure.   Sit<>stand in paraller bars x 2 with mod assist from PT BUE on parallel bars. Standing talerance in standing in parallel bars 2 x 1 minute with min-supervision assist from PT.   [facility] and [facility] and all needs met.              Therapy Documentation Precautions:  Precautions Precautions: Fall Precaution Comments: telemetry Restrictions Weight Bearing Restrictions: No LLE Weight Bearing: Non weight bearing Other Position/Activity Restrictions: NWB LLE at residual limb    Vital Signs: Therapy Vitals Pulse Rate: 76 Resp: 18 BP: (!) 125/46 Patient Position (if appropriate): Lying Oxygen Therapy SpO2: 96 % O2 Device: Room Air Pain: Pain Assessment Pain Score: 0-No pain   See Function Navigator for Current Functional Status.   Therapy/Group: Individual Therapy  ALorie Phenix7/09/2018, 10:28 AM

## 2018-05-28 NOTE — Progress Notes (Signed)
Social Work Patient ID: [name], female   DOB: 1966-06-16, 51 y.o.   MRN: [id number]  Have reviewed team conference with pt and spouse.  Both aware and agreeable with targeted d/c date of 7/17 and mod ind - supervision goals overall.  Spouse attending therapies today and reviewing DME needs.  I am currently discussing w/c options with [facility] to include new cushion if at all possible.  Continue to follow.  Electa Sterry, LCSW

## 2018-05-28 NOTE — Progress Notes (Signed)
Occupational Therapy Session Note  Patient Details  Name: [name] MRN: [id number] Date of Birth: 05-17-1966  Today's Date: 05/28/2018 OT Individual Time: 1300-1345 OT Individual Time Calculation (min): 45 min    Short Term Goals: Week 1:  OT Short Term Goal 1 (Week 1): Pt will complete slide board transfer to [facility] with min A OT Short Term Goal 2 (Week 1): Pt will don footwear wiht supervision OT Short Term Goal 3 (Week 1): Pt will complete clothing management during toileting wiht MIN A using lateral leans Week 2:     Skilled Therapeutic Interventions/Progress Updates:    1:1 Focus on d/c planning,DME education.  Performed transfers w/c<>padded tub bench with cutout with setup and supervision.  Husband and pt agreed to purchase this on their own and also get a drop arm BSC through insurance. Education provided and questions answered about healing process and prosthesis training. Discharge plans also discussed with SW present in room.   Therapy Documentation Precautions:  Precautions Precautions: Fall Precaution Comments: telemetry Restrictions Weight Bearing Restrictions: No LLE Weight Bearing: Non weight bearing Other Position/Activity Restrictions: NWB LLE at residual limb Pain: Pain Assessment Pain Score: 10-Worst pain ever  See Function Navigator for Current Functional Status.   Therapy/Group: Individual Therapy  Willeen Cass [facility] 05/28/2018, 3:47 PM

## 2018-05-28 NOTE — Patient Care Conference (Signed)
Inpatient RehabilitationTeam Conference and Plan of Care Update Date: 05/27/2018   Time: 2:40 PM    Patient Name: [name]      Medical Record Number: [id number]  Date of Birth: 1966/03/07 Sex: Female         Room/Bed: 4W05C/4W05C-01 Payor Info: Payor: [employer] MEDICARE / Plan: [employer] MEDICARE / Product Type: *No Product type* /    Admitting Diagnosis: l aka sepsis  Admit Date/Time:  05/22/2018  2:39 PM Admission Comments: No comment available   Primary Diagnosis:  <principal problem not specified> Principal Problem: <principal problem not specified>  Patient Active Problem List   Diagnosis Date Noted  . Labile blood pressure   . Diabetic retinopathy associated with type 2 diabetes mellitus ([place])   . Hypoalbuminemia due to protein-calorie malnutrition ([place])   . Type 2 diabetes mellitus with peripheral neuropathy (HCC)   . Chronic diastolic congestive heart failure ([place])   . Above knee amputation status, left ([place]) 05/22/2018  . AKI (acute kidney injury) ([place])   . Unilateral AKA, left ([place])   . ATN (acute tubular necrosis) ([place])   . Atrial fibrillation with rapid ventricular response ([place])   . Diabetic peripheral neuropathy ([place])   . Acute blood loss anemia   . Anemia of chronic disease   . Hyperlipidemia   . Gastroesophageal reflux disease   . Non-ST elevation (NSTEMI) myocardial infarction ([place])   . Acute combined systolic and diastolic heart failure ([place])   . Acute encephalopathy   . Acute renal failure ([place])   . Acute respiratory failure ([place])   . Sepsis ([place])   . Lower limb ischemia 04/25/2018  . Microcytic anemia 04/25/2018  . Pulmonary nodule 04/25/2018  . Gangrene of lower extremity ([place]) 04/25/2018  . 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 Respiratory failure with hypoxia ([place]) 12/02/2017  . Leukocytosis 12/02/2017  . Paroxysmal atrial fibrillation with rapid ventricular response ([place]) 12/02/2017  . Thrombosis 11/24/2017  .  Hyperkalemia 06/26/2017  . Atherosclerosis of native artery of right lower extremity with ulceration of ankle ([place]) 05/13/2017  . 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 Non-pressure chronic ulcer of right ankle limited to breakdown of skin ([place]) 01/17/2017  . Elevated lactic acid level   . 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 Chronic pain 12/24/2016  . Buttock wound, left, subsequent encounter   . Hypotension 12/23/2016  . IDDM (insulin dependent diabetes mellitus) ([place]) 12/23/2016  . Acute kidney injury ([place]) 12/23/2016  . Malnutrition ([place]) 12/23/2016  . Thrush 12/23/2016  . Septic shock ([place]) 11/27/2016  . DM2 (diabetes mellitus, type 2) ([place]) 11/27/2016  . Chronic back pain 11/27/2016  . Orthostatic hypotension 11/27/2016  . Demand ischemia ([place])   . Hyperlipidemia LDL goal <70   . Right sided weakness   . Retinal tumor of right eye   . Pressure injury of skin 10/01/2016  . Necrotizing fasciitis ([place]) 09/30/2016  . 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 Paresthesia of both hands 03/05/2016  . DDD (degenerative disc disease), lumbosacral 03/26/2015  . DDD (degenerative disc disease), cervical 03/26/2015  . Sacroiliac joint disease 03/26/2015  . Occipital neuralgia 03/26/2015  . Pain in limb-Bilateral Leg 04/25/2014  . PAD (peripheral artery disease) ([place]) 04/05/2013  . Chronic total occlusion of artery of the extremities ([place]) 02/10/2012  . Peripheral vascular disease, unspecified ([place]) 02/10/2012    Expected Discharge Date: Expected Discharge Date: 06/03/18  Team Members Present: Physician leading conference: Dr. Delice Lesch Social Worker  Present: Lennart Pall, LCSW Nurse Present: Rayetta Humphrey, RN PT Present: Barrie Folk, PT OT Present: Willeen Cass, OT SLP Present: Windell Moulding, SLP PPS Coordinator present : Daiva Nakayama, RN, CRRN     Current Status/Progress Goal Weekly Team Focus  Medical   Decreased functional mobility secondary to  left AKA 02/22/9628 complicated by septic shock and AKI  Improve mobility, transfers, safety, AKI, CHF, DM  See above   Bowel/Bladder   Continent to bowel and bladder.LBM 7/11.  To continue continent to bowel and bladder with min. assisst.  To monitor I & O Q shift.   Swallow/Nutrition/ Hydration             ADL's   currently supervision   mod I   family education, d/c planning, transfer training    Mobility   supervision assist- Mod I bved mobility. mint o supervision assist trasnfers to [facility] supervision assist WC mobility due to visual deficits.   Mod I at [facility] level to  d/c home.   improved safety. endurance. UE strengthening, LE strengthening. initiation of standing balance, improved independence with WC and transfers.    Communication             Safety/Cognition/ Behavioral Observations            Pain   Cronic back pain,on Vicodin PO BID. Oxi 5 mg Q 6 hrs. on used.  To keep pain levels under control(less than 3) with the medical treatment and alternative options.  To assess pain levels Q 2 hrs, and PRN.   Skin   Stage 3 on Rt. lower buttockx. Surgical incision on Lt. stump.  To continue daily dressing changes.To keep skin free from new pressure ulcers.  To monitor skin Q shift.    Rehab Goals Patient on target to meet rehab goals: Yes *See Care Plan and progress notes for long and short-term goals.     Barriers to Discharge  Current Status/Progress Possible Resolutions Date Resolved   Physician    Medical stability;Weight bearing restrictions     See above  Therapies, follow labs monitor weights, optimize DM meds      Nursing                  PT                    OT                  SLP                SW                Discharge Planning/Teaching Needs:  Plan to d/c home with spouse and other family providing 24/7 assistance  Teaching to be done prior to d/c.   Team Discussion:  AKI slowly improving;  CHF - monitor.  C/o pain in legs, feet and back.  Cont b/b.   Stage 2 on bottom.  Transfers with min assist and therapy placing sliding board.  Min - supervision with PT, sit - stand min/max.  Very nervous with movement.  Goals ~ mod ind tfs and OT.  Discussion of DME needs and home/car measurement needs.  Revisions to Treatment Plan:  None    Continued Need for Acute Rehabilitation Level of Care: The patient requires daily medical management by a physician with specialized training in physical medicine and rehabilitation for the following conditions: Daily direction of a multidisciplinary physical rehabilitation program to ensure  safe treatment while eliciting the highest outcome that is of practical value to the patient.: Yes Daily medical management of patient stability for increased activity during participation in an intensive rehabilitation regime.: Yes Daily analysis of laboratory values and/or radiology reports with any subsequent need for medication adjustment of medical intervention for : Post surgical problems;Diabetes problems;Cardiac problems;Renal problems;Blood pressure problems  Kilea Mccarey 05/29/2018, 11:21 AM

## 2018-05-28 NOTE — Plan of Care (Signed)
  Problem: RH SKIN INTEGRITY Goal: RH STG SKIN FREE OF INFECTION/BREAKDOWN Description Min assist  Outcome: Progressing  Continue to assess skin, manage dsg changes as ordered and needed.   Problem: RH SAFETY Goal: RH STG ADHERE TO SAFETY PRECAUTIONS W/ASSISTANCE/DEVICE Description STG Adhere to Safety Precautions With cues/supervision  Assistance/Device.  Outcome: Progressing  Call light within reach, bed/chair alarm, proper footwear.

## 2018-05-29 ENCOUNTER — Inpatient Hospital Stay (HOSPITAL_COMMUNITY): Payer: Medicare Other

## 2018-05-29 ENCOUNTER — Inpatient Hospital Stay (HOSPITAL_COMMUNITY): Payer: Medicare Other | Admitting: Physical Therapy

## 2018-05-29 LAB — CBC WITH DIFFERENTIAL/PLATELET
BASOS ABS: 0.1 10*3/uL (ref 0.0–0.1)
Basophils Relative: 1 %
EOS ABS: 0.3 10*3/uL (ref 0.0–0.7)
Eosinophils Relative: 4 %
HCT: 32 % — ABNORMAL LOW (ref 36.0–46.0)
Hemoglobin: 9.4 g/dL — ABNORMAL LOW (ref 12.0–15.0)
LYMPHS ABS: 1.2 10*3/uL (ref 0.7–4.0)
Lymphocytes Relative: 16 %
MCH: 26.6 pg (ref 26.0–34.0)
MCHC: 29.4 g/dL — AB (ref 30.0–36.0)
MCV: 90.7 fL (ref 78.0–100.0)
MONO ABS: 0.5 10*3/uL (ref 0.1–1.0)
Monocytes Relative: 7 %
NEUTROS ABS: 5.4 10*3/uL (ref 1.7–7.7)
Neutrophils Relative %: 72 %
Platelets: 304 10*3/uL (ref 150–400)
RBC: 3.53 MIL/uL — ABNORMAL LOW (ref 3.87–5.11)
RDW: 21.8 % — AB (ref 11.5–15.5)
WBC: 7.5 10*3/uL (ref 4.0–10.5)

## 2018-05-29 LAB — BASIC METABOLIC PANEL
Anion gap: 9 (ref 5–15)
BUN: 23 mg/dL — ABNORMAL HIGH (ref 6–20)
CALCIUM: 8.6 mg/dL — AB (ref 8.9–10.3)
CO2: 29 mmol/L (ref 22–32)
CREATININE: 2.34 mg/dL — AB (ref 0.44–1.00)
Chloride: 104 mmol/L (ref 98–111)
GFR calc non Af Amer: 22 mL/min — ABNORMAL LOW (ref >60)
GFR, EST AFRICAN AMERICAN: 26 mL/min — AB (ref >60)
Glucose, Bld: 167 mg/dL — ABNORMAL HIGH (ref 70–99)
Potassium: 3.5 mmol/L (ref 3.5–5.1)
SODIUM: 142 mmol/L (ref 135–145)

## 2018-05-29 LAB — GLUCOSE, CAPILLARY
GLUCOSE-CAPILLARY: 173 mg/dL — AB (ref 70–99)
GLUCOSE-CAPILLARY: 206 mg/dL — AB (ref 70–99)
Glucose-Capillary: 179 mg/dL — ABNORMAL HIGH (ref 70–99)
Glucose-Capillary: 182 mg/dL — ABNORMAL HIGH (ref 70–99)

## 2018-05-29 NOTE — Progress Notes (Signed)
[place] PHYSICAL MEDICINE & REHABILITATION     PROGRESS NOTE  Subjective/Complaints:  Patient seen sitting up at the edge of her bed about to eat breakfast. She states she slept well overnight. She is still very upset and apathetic. She states she wants to go home and is fixated on that. She states she is doing better than expected in therapies.  ROS: denies CP, SOB, nausea, vomiting, diarrhea.  Objective: Vital Signs: Blood pressure (!) 113/49, pulse 73, temperature 99.4 F (37.4 C), temperature source Oral, resp. rate 18, weight 69.9 kg (154 lb), SpO2 96 %. No results found. Recent Labs    05/29/18 0452  WBC 7.5  HGB 9.4*  HCT 32.0*  PLT 304   Recent Labs    05/29/18 0452  NA 142  K 3.5  CL 104  GLUCOSE 167*  BUN 23*  CREATININE 2.34*  CALCIUM 8.6*   CBG (last 3)  Recent Labs    05/28/18 1705 05/28/18 2319 05/29/18 0705  GLUCAP 166* 146* 173*    Wt Readings from Last 3 Encounters:  05/29/18 69.9 kg (154 lb)  05/22/18 74.8 kg (164 lb 14.5 oz)  04/20/18 72.6 kg (160 lb)    Physical Exam:  BP (!) 113/49 (BP Location: Right Arm)   Pulse 73   Temp 99.4 F (37.4 C) (Oral)   Resp 18   Wt 69.9 kg (154 lb)   SpO2 96%   BMI 24.12 kg/m  Constitutional: She appears well-developed and well-nourished.  HENT: Normocephalic and atraumatic.  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 No JVD. Respiratory: Effort normal and breath sounds normal.  GI: Bowel sounds are normal. She exhibits no distension.  Musculoskeletal: Left stump and [name] edema, improving Neurological: She is alert and oriented.  Motor: B/l UE 4+/5 proximal to distal LLE: HF 4/5 (pain inhibition, improving) [name]: HF 4/5, KE 4/5, ADF 4+/5 (some pain inhibition, improving) Skin: Amputee site with staples c/d/i  Psychiatric: She has a normal mood and affect. Her behavior is normal.    Assessment/Plan: 1. Functional deficits secondary to left AKA which require 3+ hours per day of  interdisciplinary therapy in a comprehensive inpatient rehab setting. Physiatrist is providing close team supervision and 24 hour management of active medical problems listed below. Physiatrist and rehab team continue to assess barriers to discharge/monitor patient progress toward functional and medical goals.  Function:  Bathing Bathing position   Position: Shower  Bathing parts Body parts bathed by patient: Right arm, Left arm, Chest, Abdomen, Front perineal area, Buttocks, Right upper leg, Left upper leg, Right lower leg Body parts bathed by helper: Back  Bathing assist Assist Level: Touching or steadying assistance(Pt > 75%)      Upper Body Dressing/Undressing Upper body dressing   What is the patient wearing?: Pull over shirt/dress     Pull over shirt/dress - Perfomed by patient: Thread/unthread right sleeve, Thread/unthread left sleeve, Put head through opening, Pull shirt over trunk          Upper body assist Assist Level: Set up      Lower Body Dressing/Undressing Lower body dressing   What is the patient wearing?: Pants, Non-skid slipper socks, Underwear, [employer]  - Performed by patient: Thread/unthread right underwear leg, Thread/unthread left underwear leg, Pull underwear up/down   Pants- Performed by patient: Thread/unthread left pants leg, Pull pants up/down Pants- Performed by helper: Thread/unthread right pants leg   Non-skid slipper socks- Performed by helper: Don/doff right sock  TED Hose - Performed by helper: Don/doff right TED hose  Lower body assist Assist for lower body dressing: Touching or steadying assistance (Pt > 75%)      Toileting Toileting Toileting activity did not occur: No continent bowel/bladder event Toileting steps completed by patient: Adjust clothing prior to toileting, Adjust clothing after toileting Toileting steps completed by helper: Adjust clothing prior to toileting, Performs perineal hygiene, Adjust  clothing after toileting(per [place], NT report)    Toileting assist Assist level: Touching or steadying assistance (Pt.75%)   Transfers Chair/bed transfer   Chair/bed transfer method: Lateral scoot Chair/bed transfer assist level: Touching or steadying assistance (Pt > 75%) Chair/bed transfer assistive device: Sliding board, Armrests     Locomotion Ambulation Ambulation activity did not occur: Safety/medical concerns         Wheelchair   Type: Manual Max wheelchair distance: 150' Assist Level: Supervision or verbal cues  Cognition Comprehension Comprehension assist level: Follows complex conversation/direction with extra time/assistive device  Expression Expression assist level: Expresses basic needs/ideas: With no assist  Social Interaction Social Interaction assist level: Interacts appropriately 90% of the time - Needs monitoring or encouragement for participation or interaction.  Problem Solving Problem solving assist level: Solves basic problems with no assist  Memory Memory assist level: More than reasonable amount of time    Medical Problem List and Plan: 1.  Decreased functional mobility secondary to left AKA 04/20/1496 complicated by septic shock and AKI  Cont CIR, will discuss with team potential for early discharge 2.  DVT Prophylaxis/Anticoagulation: Subcutaneous heparin 3. Pain Management: Oxycodone as needed 4. Mood: Provide emotional support 5. Neuropsych: This patient is capable of making decisions on her own behalf. 6. Skin/Wound Care/sacral decubitus: Routine skin checks and follow-up per WOC for wound recommendations 7. Fluids/Electrolytes/Nutrition: Routine in and outs 8.  AKI in setting of septic shock consistent with ischemic ATN.  Follow-up per renal services.  No indication for hemodialysis at this time.  Baseline creatinine was 0.9-1  Cr 2.34 on 7/12  Cont to monitor 9.  NSTEMI/atrial fibrillation with RVR.  Discussed plan for cardiac  catheterization once renal function stabilized.  Presently on amiodarone 200 mg daily, Lopressor 12.5 mg twice daily.  No plan to resume Xarelto at this time 10.  Diastolic congestive heart failure.  Monitor for any signs of fluid overload Filed Weights   05/26/18 0607 05/27/18 0506 05/29/18 0535  Weight: 72.1 kg (158 lb 15.2 oz) 71.2 kg (157 lb) 69.9 kg (154 lb)   Stable on 7/12 11.  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  Latest hemoglobin A1c 7.2.  SSI.    Patient on Humalog 10 units 3 times daily  Lantus insulin 36 units twice daily prior to admission.    Lantus 10 BID started on 7/8, increased to 12 on 7/11  Remains elevated on 7/12, will consider further medication adjustments in the next couple of days. 12.  Acute on chronic anemia.  Ferrous sulfate 325 mg twice daily.    Hb 9.4 on 7/12  Cont to monitor 13.  Hyperlipidemia.  Crestor 14.  GERD.  Protonix 15. Hypoalbuminemia  Supplement initiated on 7/8 16. Blood pressure  Labile, but overall controlled on insulin/12  LOS (Days) 7 A FACE TO FACE EVALUATION WAS PERFORMED  Reneshia Zuccaro Lorie Phenix 05/29/2018 8:03 AM

## 2018-05-29 NOTE — Progress Notes (Signed)
Occupational Therapy Session Note  Patient Details  Name: [name] MRN: [id number] Date of Birth: 1966/03/02  Today's Date: 05/29/2018 OT Individual Time: 1025-8527 OT Individual Time Calculation (min): 73 min    Short Term Goals: Week 1:  OT Short Term Goal 1 (Week 1): Pt will complete slide board transfer to [facility] with min A OT Short Term Goal 2 (Week 1): Pt will don footwear wiht supervision OT Short Term Goal 3 (Week 1): Pt will complete clothing management during toileting wiht MIN A using lateral leans  Skilled Therapeutic Interventions/Progress Updates:    1;1. Pt c/o pain in buttocks. RN aware. Pt very emotional throuhgout session crying as pt thought she would be d/c tomorrow. OT continues to educate on ELOS and MOD I goals need to be achieved prior to going home. Pt verbalizes understanding and willing to participate in transfer and training on toileting. Pt completes LB dressing EOB/supine rollinging to advance pants past hips. Pt requires cues to put foot up on stool to don sock. Pt compeltes slide baord transfers EOB>BSC>w/c>recliner with supervision and VC for checking placement of board. Pt simulates toileting leaning laterally to advance pants past hips prior, but unable to completes clothing management after simulated peri care. Pt becomes emotional again stating, "you make me do more than anyone else in this whole hospital." Therapeutic use of self and empathetic listening provided. OT encouraged pt to continue with training as toileting is a task that will need to be completed without help. Pt reporting pain in bottom too much to continue with lateral leaning on padded BSC. Pt able to lateral lean from w/c onto EOB and OT finishes clothing management. Pt grooms at sink with set up. Exited session with pt seated in recliner, call light in reach and all needs met.   Therapy Documentation Precautions:  Precautions Precautions: Fall Precaution Comments:  telemetry Restrictions Weight Bearing Restrictions: No LLE Weight Bearing: Non weight bearing Other Position/Activity Restrictions: NWB LLE at residual limb General:   Vital Signs: Therapy Vitals Pulse Rate: 69 BP: (!) 102/58 Patient Position (if appropriate): Sitting Pain: Pain Assessment Pain Score: 6  Pain Type: Chronic pain Pain Location: Back Pain Orientation: Lower Pain Descriptors / Indicators: Aching Pain Onset: On-going Patients Stated Pain Goal: 2 Pain Intervention(s): Medication (See eMAR);Repositioned Multiple Pain Sites: No  See Function Navigator for Current Functional Status.   Therapy/Group: Individual Therapy  Tonny Branch 05/29/2018, 10:46 AM

## 2018-05-29 NOTE — Progress Notes (Signed)
Physical Therapy Session Note  Patient Details  Name: [name] MRN: [id number] Date of Birth: [birth year]  Today's Date: 05/29/2018 PT Individual Time:1300-1400 AND 1515-1615   60 min and 60 min   Short Term Goals: Week 1:  PT Short Term Goal 1 (Week 1): STG=LTG due to ELOS   Skilled Therapeutic Interventions/Progress Updates: Session 1.   Pt received sitting in recliner and agreeable to PT. Squat pivot to [facility] with close min-mod assist from PT for clear glutes onto WC.   WC mobility through hall of hospital x 168f and 1527fwith supervision assist for direction and safety to manage doorways   SB transfer to mat table with supervision assist from PT. Min cues for proper board placement and improved anterior weight shifting.   PT instructed pt in Press ups with Yoga blocks 2 x 5 with 3 sec hold. Pt required to stabilize the [name] to improve use of LE in order to increase effectiveness of push. Lateral scoots on Mat table x 5 Bil with emphasis on press up prior to scooting. Moderate cues for improved technique and set up.    Pt returned to room and performed SB transfer to bed with supervision assist for PT. Sit>supine completed with supervision assist, and left supine in bed with call bell in reach and all needs met.    Session 2.   Pt received supine in bed and agreeable to PT. Supine>sit transfer with min assist from PTand cues for improved use of UE to push into sitting.   Lateral scoot, [facility]. Pt able to manage clothing with lateral lean technique and distant supervision assist from PT. [facility] for proper placement of SB  WC mobility through rehab unit with distant supervision assist x [id number] min cues in transition from hall to day room due to visual deficits with change in lighting.   SB transfer onto nustep with set up/supervision assist for up hill transfer. Nustep BUE and [name] endurance training x 5mi1mlevel 3 with  2 min rest break every minute due to fatigue.    Sit<>stand in parallel bars x 3 with max assist from PT. Minisquats in parallel bars 3 x 5 with min assist from PT to prevent R knee collapse.   Patient returned to room and [facility] reach and all needs met.        Therapy Documentation Precautions:  Precautions Precautions: Fall Precaution Comments: telemetry Restrictions Weight Bearing Restrictions: No LLE Weight Bearing: Non weight bearing Other Position/Activity Restrictions: NWB LLE at residual limb    Vital Signs: Therapy Vitals Temp: 98.2 F (36.8 C) Temp Source: Oral Pulse Rate: 70 Resp: 18 BP: (!) 120/55 Patient Position (if appropriate): Sitting Oxygen Therapy SpO2: 99 % O2 Device: Room Air Pain: Pain Assessment Pain Scale: 0-10 Pain Score: 7  Pain Type: Chronic pain Pain Location: Back Pain Orientation: Lower Pain Descriptors / Indicators: Aching Pain Frequency: Intermittent Pain Onset: On-going Patients Stated Pain Goal: 2 Pain Intervention(s): Medication (See eMAR);Repositioned Multiple Pain Sites: No   See Function Navigator for Current Functional Status.   Therapy/Group: Individual Therapy  AustLorie Phenix2/2019, 4:08 PM

## 2018-05-30 ENCOUNTER — Inpatient Hospital Stay (HOSPITAL_COMMUNITY): Payer: Medicare Other | Admitting: Physical Therapy

## 2018-05-30 DIAGNOSIS — I739 Peripheral vascular disease, unspecified: Secondary | ICD-10-CM

## 2018-05-30 LAB — GLUCOSE, CAPILLARY
GLUCOSE-CAPILLARY: 188 mg/dL — AB (ref 70–99)
GLUCOSE-CAPILLARY: 239 mg/dL — AB (ref 70–99)
GLUCOSE-CAPILLARY: 160 mg/dL — AB (ref 70–99)
Glucose-Capillary: 152 mg/dL — ABNORMAL HIGH (ref 70–99)

## 2018-05-30 MED ORDER — INSULIN GLARGINE 100 UNIT/ML ~~LOC~~ SOLN
14.0000 [IU] | Freq: Two times a day (BID) | SUBCUTANEOUS | Status: DC
Start: 2018-05-30 — End: 2018-06-02
  Administered 2018-05-30 – 2018-06-02 (×6): 14 [IU] via SUBCUTANEOUS
  Filled 2018-05-30 (×6): qty 0.14

## 2018-05-30 MED ORDER — ALPRAZOLAM 0.25 MG PO TABS
0.2500 mg | ORAL_TABLET | Freq: Three times a day (TID) | ORAL | Status: DC | PRN
  Administered 2018-05-30: 0.25 mg via ORAL
  Filled 2018-05-30: qty 1

## 2018-05-30 NOTE — Progress Notes (Signed)
Physical Therapy Session Note  Patient Details  Name: [name] MRN: [id number] Date of Birth: 1966-11-14  Today's Date: 05/30/2018 PT Individual Time:805-900   55 min   Short Term Goals: Week 1:  PT Short Term Goal 1 (Week 1): STG=LTG due to ELOS   Skilled Therapeutic Interventions/Progress Updates:   Pt received sitting EOB and agreeable to PT. Pt reports need for use of BSC for urnination. Lateral scoot transfer to [facility] with supervision assist from PT with min cues for improved anterior weight shifting to increased lift off seat and reduce sheer forces on gluteal wound. Clothing management and peri care completed wit distant supervision assist from PT  WC mobility. Through rehab unit x 150f. WC mobility training also instructed by PT over unlevel surface of cement sidewalk at hospital entrance x 1533f [id number] [id number] assist from PT with cues for technique and safety to prevent hitting obstacles on the R as well as to improve anterior weight shift to ascend hills.    Patient returned to room and [facility] in reach and all needs met.         Therapy Documentation Precautions:  Precautions Precautions: Fall Precaution Comments: telemetry Restrictions Weight Bearing Restrictions: No LLE Weight Bearing: Non weight bearing Other Position/Activity Restrictions: NWB LLE at residual limb Vital Signs: Therapy Vitals Temp: 98.2 F (36.8 C) Temp Source: Oral Pulse Rate: 66 Resp: 18 BP: (!) 120/53 Patient Position (if appropriate): Sitting Oxygen Therapy SpO2: 99 % O2 Device: Room Air Pain: Pain Assessment Pain Scale: 0-10 Pain Score: 10-Worst pain ever Pain Type: Chronic pain Pain Location: Back Pain Orientation: Lower Pain Descriptors / Indicators: Aching Pain Onset: On-going Patients Stated Pain Goal: 3 Pain Intervention(s): Medication (See eMAR) Multiple Pain Sites: No   See Function Navigator for Current Functional  Status.   Therapy/Group: Individual Therapy  AustLorie Phenix3/2019, 9:03 AM

## 2018-05-30 NOTE — Progress Notes (Signed)
[place] PHYSICAL MEDICINE & REHABILITATION     PROGRESS NOTE  Subjective/Complaints:  At EOB. Upset that her discharge date was moved back. Doesn't know "what I did wrong"  ROS: Patient denies fever, rash, sore throat, blurred vision, nausea, vomiting, diarrhea, cough, shortness of breath or chest pain,   Headache .   Objective: Vital Signs: Blood pressure (!) 120/53, pulse 66, temperature 98.2 F (36.8 C), temperature source Oral, resp. rate 18, weight 72.1 kg (158 lb 15.2 oz), SpO2 99 %. No results found. Recent Labs    05/29/18 0452  WBC 7.5  HGB 9.4*  HCT 32.0*  PLT 304   Recent Labs    05/29/18 0452  NA 142  K 3.5  CL 104  GLUCOSE 167*  BUN 23*  CREATININE 2.34*  CALCIUM 8.6*   CBG (last 3)  Recent Labs    05/29/18 1642 05/29/18 2126 05/30/18 0648  GLUCAP 179* 182* 152*    Wt Readings from Last 3 Encounters:  05/30/18 72.1 kg (158 lb 15.2 oz)  05/22/18 74.8 kg (164 lb 14.5 oz)  04/20/18 72.6 kg (160 lb)    Physical Exam:  BP (!) 120/53 (BP Location: Right Arm)   Pulse 66   Temp 98.2 F (36.8 C) (Oral)   Resp 18   Wt 72.1 kg (158 lb 15.2 oz)   SpO2 99%   BMI 24.90 kg/m  Constitutional: No distress . Vital signs reviewed. HEENT: EOMI, oral membranes moist Neck: supple Cardiovascular: RRR without murmur. No JVD    Respiratory: CTA Bilaterally without wheezes or rales. Normal effort    GI: BS +, non-tender, non-distended  Musculoskeletal: Left stump and [name] edema, improving Neurological: She is alert and oriented.  Motor: B/l UE 4+/5 proximal to distal LLE: HF 4/5 (some pain inhib) [name]: HF 4/5, KE 4/5, ADF 4+/5 (some pain inhib) Skin: Amputee site with staples c/d/i  Psychiatric: anxious and tearful    Assessment/Plan: 1. Functional deficits secondary to left AKA which require 3+ hours per day of interdisciplinary therapy in a comprehensive inpatient rehab setting. Physiatrist is providing close team supervision and 24 hour management of  active medical problems listed below. Physiatrist and rehab team continue to assess barriers to discharge/monitor patient progress toward functional and medical goals.  Function:  Bathing Bathing position   Position: Shower  Bathing parts Body parts bathed by patient: Right arm, Left arm, Chest, Abdomen, Front perineal area, Buttocks, Right upper leg, Left upper leg, Right lower leg Body parts bathed by helper: Back  Bathing assist Assist Level: Touching or steadying assistance(Pt > 75%)      Upper Body Dressing/Undressing Upper body dressing   What is the patient wearing?: Pull over shirt/dress     Pull over shirt/dress - Perfomed by patient: Thread/unthread right sleeve, Thread/unthread left sleeve, Put head through opening, Pull shirt over trunk          Upper body assist Assist Level: Set up      Lower Body Dressing/Undressing Lower body dressing   What is the patient wearing?: Pants, Non-skid slipper socks, Underwear, [employer]  - Performed by patient: Thread/unthread right underwear leg, Thread/unthread left underwear leg, Pull underwear up/down   Pants- Performed by patient: Thread/unthread left pants leg, Pull pants up/down Pants- Performed by helper: Thread/unthread right pants leg   Non-skid slipper socks- Performed by helper: Don/doff right sock               TED Hose - Performed by helper: Don/doff right  TED hose  Lower body assist Assist for lower body dressing: Touching or steadying assistance (Pt > 75%)      Toileting Toileting Toileting activity did not occur: No continent bowel/bladder event Toileting steps completed by patient: Adjust clothing prior to toileting, Adjust clothing after toileting Toileting steps completed by helper: Adjust clothing prior to toileting, Performs perineal hygiene, Adjust clothing after toileting(per [place], NT report)    Toileting assist Assist level: Touching or steadying assistance (Pt.75%)    Transfers Chair/bed transfer   Chair/bed transfer method: Lateral scoot Chair/bed transfer assist level: Touching or steadying assistance (Pt > 75%) Chair/bed transfer assistive device: Sliding board, Armrests     Locomotion Ambulation Ambulation activity did not occur: Safety/medical concerns         Wheelchair   Type: Manual Max wheelchair distance: 150' Assist Level: Supervision or verbal cues  Cognition Comprehension Comprehension assist level: Understands complex 90% of the time/cues 10% of the time  Expression Expression assist level: Expresses basic needs/ideas: With no assist  Social Interaction Social Interaction assist level: Interacts appropriately 90% of the time - Needs monitoring or encouragement for participation or interaction.  Problem Solving Problem solving assist level: Solves basic problems with no assist  Memory Memory assist level: More than reasonable amount of time    Medical Problem List and Plan: 1.  Decreased functional mobility secondary to left AKA 1/0/9323 complicated by septic shock and AKI  Cont CIR, ELOS 7/17  -I am unaware that DC date was moved back 2.  DVT Prophylaxis/Anticoagulation: Subcutaneous heparin 3. Pain Management: Oxycodone as needed 4. Mood: Provide emotional support 5. Neuropsych: This patient is capable of making decisions on her own behalf. 6. Skin/Wound Care/sacral decubitus: Routine skin checks and follow-up per WOC for wound recommendations 7. Fluids/Electrolytes/Nutrition: Routine in and outs 8.  AKI in setting of septic shock consistent with ischemic ATN.  Follow-up per renal services.  No indication for hemodialysis at this time.  Baseline creatinine was 0.9-1  Cr 2.34 on 7/12  Cont to monitor 9.  NSTEMI/atrial fibrillation with RVR.  Discussed plan for cardiac catheterization once renal function stabilized.  Presently on amiodarone 200 mg daily, Lopressor 12.5 mg twice daily.  No plan to resume Xarelto at this  time 10.  Diastolic congestive heart failure.  Monitor for any signs of fluid overload Filed Weights   05/27/18 0506 05/29/18 0535 05/30/18 0555  Weight: 71.2 kg (157 lb) 69.9 kg (154 lb) 72.1 kg (158 lb 15.2 oz)   Stable on 7/13 11.  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  Latest hemoglobin A1c 7.2.  SSI.    Patient on Humalog 10 units 3 times daily  Lantus insulin 36 units twice daily prior to admission.    Lantus 10 BID started on 7/8, increased to 12 on 7/11  Remains elevated increase to 14u bid 12.  Acute on chronic anemia.  Ferrous sulfate 325 mg twice daily.    Hb 9.4 on 7/12  Cont to monitor 13.  Hyperlipidemia.  Crestor 14.  GERD.  Protonix 15. Hypoalbuminemia  Supplement initiated on 7/8 16. Blood pressure    overall controlled   LOS (Days) 8 A FACE TO FACE EVALUATION WAS PERFORMED  Meredith Staggers 05/30/2018 7:58 AM

## 2018-05-31 ENCOUNTER — Inpatient Hospital Stay (HOSPITAL_COMMUNITY): Payer: Medicare Other

## 2018-05-31 LAB — GLUCOSE, CAPILLARY
GLUCOSE-CAPILLARY: 147 mg/dL — AB (ref 70–99)
GLUCOSE-CAPILLARY: 180 mg/dL — AB (ref 70–99)
Glucose-Capillary: 229 mg/dL — ABNORMAL HIGH (ref 70–99)
Glucose-Capillary: 169 mg/dL — ABNORMAL HIGH (ref 70–99)

## 2018-05-31 MED ORDER — PREGABALIN 50 MG PO CAPS
50.0000 mg | ORAL_CAPSULE | Freq: Two times a day (BID) | ORAL | Status: DC
Start: 2018-05-31 — End: 2018-06-03
  Administered 2018-05-31 – 2018-06-03 (×7): 50 mg via ORAL
  Filled 2018-05-31 (×8): qty 1

## 2018-05-31 NOTE — Progress Notes (Addendum)
Physical Therapy Session Note  Patient Details  Name: [name] MRN: [id number] Date of Birth: [birth year]  Today's Date: 05/31/2018 PT Individual Time: 1400-1505 PT Individual Time Calculation (min): 65 min   Short Term Goals: Week 1:  PT Short Term Goal 1 (Week 1): STG=LTG due to ELOS       Skilled Therapeutic Interventions/Progress Updates:  Pt's residual limb not wrapped.  PT re-wrapped with spica around waist for stability. PT consulted with Threasa Beards, RN regarding compression wrapping. She could not find an order; will clarifiy tomorrow.  Pt is a poor historian regarding last time limb was wrapped.  Residual limb is clean, dry, and very blunt at distal end, requiring shaping for prosthetic potential.   Supine> sit with use of bed features and min assist. Slide board transfer bed> w/c to L with min cues, close supervision.    Pt stated she needed to use toilet urgently for BM.   Squat pivot to R w/c> BSC over toilet with max assist , slightly uphill, for continent B and B.    Sqaut pivot BSC> w/c slightly downhill, with min assist.  Hand hygiene independently at sink at w/c level;  pt propelled around room with supervision, safely.  Pt left resting in w/c with needs at hand and tray table in front of her.  She declined seat belt alarm.  She stated that her husband plans to borrow a car for d/c, as their vehicles are all too high.    Therapy Documentation Precautions:  Precautions Precautions: Fall Precaution Comments: telemetry Restrictions Weight Bearing Restrictions: Yes LLE Weight Bearing: Non weight bearing Other Position/Activity Restrictions: NWB LLE at residual limb  Pain: 10/10 residual limb, buttock wound, low back; to be medicated at end of session.       See Function Navigator for Current Functional Status.   Therapy/Group: Individual Therapy  Tess Potts 05/31/2018, 5:17 PM

## 2018-05-31 NOTE — Progress Notes (Signed)
[place] PHYSICAL MEDICINE & REHABILITATION     PROGRESS NOTE  Subjective/Complaints:  Sitting edge of bed.  Feels cold and notes that her feet and hands are painful and tingling.  States that she was on Lyrica at home 100 mg 3 times daily.  Asked if she could resume this.  ROS: Patient denies fever, rash, sore throat, blurred vision, nausea, vomiting, diarrhea, cough, shortness of breath or chest pain, joint or back pain, headache, or mood change.   Objective: Vital Signs: Blood pressure (!) 124/48, pulse 69, temperature 97.9 F (36.6 C), temperature source Oral, resp. rate 17, weight 72.7 kg (160 lb 4.4 oz), SpO2 99 %. No results found. Recent Labs    05/29/18 0452  WBC 7.5  HGB 9.4*  HCT 32.0*  PLT 304   Recent Labs    05/29/18 0452  NA 142  K 3.5  CL 104  GLUCOSE 167*  BUN 23*  CREATININE 2.34*  CALCIUM 8.6*   CBG (last 3)  Recent Labs    05/30/18 1648 05/30/18 2131 05/31/18 0650  GLUCAP 239* 160* 147*    Wt Readings from Last 3 Encounters:  05/31/18 72.7 kg (160 lb 4.4 oz)  05/22/18 74.8 kg (164 lb 14.5 oz)  04/20/18 72.6 kg (160 lb)    Physical Exam:  BP (!) 124/48 (BP Location: Right Arm)   Pulse 69   Temp 97.9 F (36.6 C) (Oral)   Resp 17   Wt 72.7 kg (160 lb 4.4 oz)   SpO2 99%   BMI 25.10 kg/m  Constitutional: No distress . Vital signs reviewed. HEENT: EOMI, oral membranes moist Neck: supple Cardiovascular: RRR without murmur. No JVD    Respiratory: CTA Bilaterally without wheezes or rales. Normal effort    GI: BS +, non-tender, non-distended  Musculoskeletal: Left stump and [name] edema, improving Neurological: She is alert and oriented.  Motor: B/l UE 4+/5 proximal to distal LLE: HF 4/5 (some pain inhib) [name]: HF 4/5, KE 4/5, ADF 4+/5 (pain inhibition) Skin: Amputation site clean dry and intact with staples  Psychiatric: Less anxious, more appropriate today   Assessment/Plan: 1. Functional deficits secondary to left AKA which require 3+  hours per day of interdisciplinary therapy in a comprehensive inpatient rehab setting. Physiatrist is providing close team supervision and 24 hour management of active medical problems listed below. Physiatrist and rehab team continue to assess barriers to discharge/monitor patient progress toward functional and medical goals.  Function:  Bathing Bathing position   Position: Shower  Bathing parts Body parts bathed by patient: Right arm, Left arm, Chest, Abdomen, Front perineal area, Buttocks, Right upper leg, Left upper leg, Right lower leg Body parts bathed by helper: Back  Bathing assist Assist Level: Touching or steadying assistance(Pt > 75%)      Upper Body Dressing/Undressing Upper body dressing   What is the patient wearing?: Pull over shirt/dress     Pull over shirt/dress - Perfomed by patient: Thread/unthread right sleeve, Thread/unthread left sleeve, Put head through opening, Pull shirt over trunk          Upper body assist Assist Level: Set up      Lower Body Dressing/Undressing Lower body dressing   What is the patient wearing?: Pants, Non-skid slipper socks, Underwear, [employer]  - Performed by patient: Thread/unthread right underwear leg, Thread/unthread left underwear leg, Pull underwear up/down   Pants- Performed by patient: Thread/unthread left pants leg, Pull pants up/down Pants- Performed by helper: Thread/unthread right pants leg   Non-skid  slipper socks- Performed by helper: Don/doff right sock               TED Hose - Performed by helper: Don/doff right TED hose  Lower body assist Assist for lower body dressing: Touching or steadying assistance (Pt > 75%)      Toileting Toileting Toileting activity did not occur: No continent bowel/bladder event Toileting steps completed by patient: Adjust clothing prior to toileting, Adjust clothing after toileting Toileting steps completed by helper: Adjust clothing prior to toileting, Performs perineal  hygiene, Adjust clothing after toileting(per [place], NT report)    Toileting assist Assist level: Touching or steadying assistance (Pt.75%)   Transfers Chair/bed transfer   Chair/bed transfer method: Lateral scoot Chair/bed transfer assist level: Touching or steadying assistance (Pt > 75%) Chair/bed transfer assistive device: Armrests, Sliding board     Locomotion Ambulation Ambulation activity did not occur: Safety/medical concerns         Wheelchair   Type: Manual Max wheelchair distance: 150' Assist Level: Supervision or verbal cues  Cognition Comprehension Comprehension assist level: Understands complex 90% of the time/cues 10% of the time  Expression Expression assist level: Expresses basic needs/ideas: With no assist  Social Interaction Social Interaction assist level: Interacts appropriately 90% of the time - Needs monitoring or encouragement for participation or interaction.  Problem Solving Problem solving assist level: Solves basic problems with no assist  Memory Memory assist level: More than reasonable amount of time    Medical Problem List and Plan: 1.  Decreased functional mobility secondary to left AKA 11/23/1094 complicated by septic shock and AKI  Cont CIR, ELOS 7/17 2.  DVT Prophylaxis/Anticoagulation: Subcutaneous heparin 3. Pain Management: Oxycodone as needed  Will resume low-dose-Lyrica 50 mg twice daily given kidney disease and observe for tolerance. (Patient states that she was on 100 mg 3 times daily at home) 4. Mood: Provide emotional support 5. Neuropsych: This patient is capable of making decisions on her own behalf. 6. Skin/Wound Care/sacral decubitus: Routine skin checks and follow-up per WOC for wound recommendations 7. Fluids/Electrolytes/Nutrition: Routine in and outs 8.  AKI in setting of septic shock consistent with ischemic ATN.  Follow-up per renal services.  No indication for hemodialysis at this time.  Baseline creatinine was  0.9-1  Cr 2.34 on 7/12  Cont to monitor 9.  NSTEMI/atrial fibrillation with RVR.  Discussed plan for cardiac catheterization once renal function stabilized.  Presently on amiodarone 200 mg daily, Lopressor 12.5 mg twice daily.  No plan to resume Xarelto at this time 10.  Diastolic congestive heart failure.  Monitor for any signs of fluid overload Filed Weights   05/29/18 0535 05/30/18 0555 05/31/18 0537  Weight: 69.9 kg (154 lb) 72.1 kg (158 lb 15.2 oz) 72.7 kg (160 lb 4.4 oz)   Weight is trending up a bit as of 7/14  -Patient remains on Lasix 80 mg daily 11.  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  Latest hemoglobin A1c 7.2.  SSI.    Patient on Humalog 10 units 3 times daily  Lantus insulin 36 units twice daily prior to admission.    Lantus 10 BID started on 7/8, increased to 12 on 7/11  Lantus again increased to 14u bid on 7/13--observed today for pattern 12.  Acute on chronic anemia.  Ferrous sulfate 325 mg twice daily.    Hb 9.4 on 7/12  Cont to monitor 13.  Hyperlipidemia.  Crestor 14.  GERD.  Protonix 15. Hypoalbuminemia  Supplement initiated on  7/8 16. Blood pressure    overall controlled   LOS (Days) 9 A FACE TO FACE EVALUATION WAS PERFORMED  Meredith Staggers 05/31/2018 7:50 AM

## 2018-05-31 NOTE — Progress Notes (Signed)
Occupational Therapy Session Note  Patient Details  Name: [name] MRN: [id number] Date of Birth: 01/21/1966  Today's Date: 05/31/2018 OT Individual Time: 2025-4270 OT Individual Time Calculation (min): 43 min    Short Term Goals: Week 1:  OT Short Term Goal 1 (Week 1): Pt will complete slide board transfer to [facility] with min A OT Short Term Goal 2 (Week 1): Pt will don footwear wiht supervision OT Short Term Goal 3 (Week 1): Pt will complete clothing management during toileting wiht MIN A using lateral leans  Skilled Therapeutic Interventions/Progress Updates:    1:1. Pt with pain in buttocks, but willing to complete therapy to tolerance, RN aware. Pt focus of session on slide board transfers. Pt propels w/c to/from all destinations to improve BUE strength and endurance required for community/houselhold mobility. Pt completes slide board transfer w/c<>standard recliner (not in room drop arm recliner) with min A for uphill transfer back to w/c for small boost. Pt asked to evaluate transfer and how she would change it. Pt able to verbalize she needed to scoot forward in chair prior to placing board to go to low surface. Pt able to manage all w/c parts except LE rest. Exited session with pt saeted in in room recliner, call light in reach and all needs met  Therapy Documentation Precautions:  Precautions Precautions: Fall Precaution Comments: telemetry Restrictions Weight Bearing Restrictions: No LLE Weight Bearing: Non weight bearing Other Position/Activity Restrictions: NWB LLE at residual limb  See Function Navigator for Current Functional Status.   Therapy/Group: Individual Therapy  Tonny Branch 05/31/2018, 9:44 AM

## 2018-06-01 ENCOUNTER — Ambulatory Visit (HOSPITAL_COMMUNITY): Payer: Medicare Other

## 2018-06-01 ENCOUNTER — Encounter (HOSPITAL_COMMUNITY): Payer: Medicare Other | Admitting: Occupational Therapy

## 2018-06-01 ENCOUNTER — Inpatient Hospital Stay (HOSPITAL_COMMUNITY): Payer: Medicare Other | Admitting: Occupational Therapy

## 2018-06-01 DIAGNOSIS — M792 Neuralgia and neuritis, unspecified: Secondary | ICD-10-CM

## 2018-06-01 LAB — GLUCOSE, CAPILLARY
GLUCOSE-CAPILLARY: 193 mg/dL — AB (ref 70–99)
Glucose-Capillary: 163 mg/dL — ABNORMAL HIGH (ref 70–99)
Glucose-Capillary: 208 mg/dL — ABNORMAL HIGH (ref 70–99)
Glucose-Capillary: 190 mg/dL — ABNORMAL HIGH (ref 70–99)

## 2018-06-01 NOTE — Progress Notes (Signed)
Occupational Therapy Session Note  Patient Details  Name: [name] MRN: [id number] Date of Birth: 05-Feb-1966  Today's Date: 06/01/2018 OT Individual Time: 0700-0740 OT Individual Time Calculation (min): 40 min  and Today's Date: 06/01/2018 OT Missed Time: 43 Minutes Missed Time Reason: Pain   Short Term Goals: Week 1:  OT Short Term Goal 1 (Week 1): Pt will complete slide board transfer to [facility] with min A OT Short Term Goal 2 (Week 1): Pt will don footwear wiht supervision OT Short Term Goal 3 (Week 1): Pt will complete clothing management during toileting wiht MIN A using lateral leans  Skilled Therapeutic Interventions/Progress Updates:    Upon entering the room, pt supine in bed with jacket pulled over face with 10/10 c/o pain in lower back. OT notified RN and requested pain medication. Pt required maximal encouragement for participation. Pt performed supine >sit with supervision and min verbal cues for technique. Pt seated on EOB with supervision and encouraged to eat breakfast. OT educating pt on discharge recommendations and expectations for discharge day. Pt asking several questions and verbalizing understanding and agreement. Pt declined transfers and other therapeutic interventions secondary to continued back pain. Pt remaining in bed with bed alarm activated and call bell within reach.   Therapy Documentation Precautions:  Precautions Precautions: Fall Precaution Comments: telemetry Restrictions Weight Bearing Restrictions: Yes LLE Weight Bearing: Non weight bearing Other Position/Activity Restrictions: NWB LLE at residual limb General: General OT Amount of Missed Time: 35 Minutes Vital Signs: Therapy Vitals Temp: 98.5 F (36.9 C) Temp Source: Oral Pulse Rate: 68 Resp: 17 BP: 121/60 Patient Position (if appropriate): Sitting Oxygen Therapy SpO2: 98 % O2 Device: Room Air  See Function Navigator for Current Functional Status.   Therapy/Group: Individual  Therapy  Gypsy Decant 06/01/2018, 7:55 AM

## 2018-06-01 NOTE — Progress Notes (Signed)
[place] PHYSICAL MEDICINE & REHABILITATION     PROGRESS NOTE  Subjective/Complaints:  Patient seen sitting up at the edge of her bed eating breakfast, about to work with therapies. She states she slept well overnight, however will with back pain this morning and she states she is still awaiting pain medications.  ROS: + back pain. Denies CP, SOB, nausea, vomiting, diarrhea  Objective: Vital Signs: Blood pressure 121/60, pulse 68, temperature 98.5 F (36.9 C), temperature source Oral, resp. rate 17, weight 69.3 kg (152 lb 12.5 oz), SpO2 98 %. No results found. No results for input(s): WBC, HGB, HCT, PLT in the last 72 hours. No results for input(s): NA, K, CL, GLUCOSE, BUN, CREATININE, CALCIUM in the last 72 hours.  Invalid input(s): CO CBG (last 3)  Recent Labs    05/31/18 1644 05/31/18 2151 06/01/18 0701  GLUCAP 169* 180* 163*    Wt Readings from Last 3 Encounters:  06/01/18 69.3 kg (152 lb 12.5 oz)  05/22/18 74.8 kg (164 lb 14.5 oz)  04/20/18 72.6 kg (160 lb)    Physical Exam:  BP 121/60 (BP Location: Right Arm)   Pulse 68   Temp 98.5 F (36.9 C) (Oral)   Resp 17   Wt 69.3 kg (152 lb 12.5 oz)   SpO2 98%   BMI 23.93 kg/m  Constitutional: No distress . Vital signs reviewed. HENT: Normocephalic.  Atraumatic. Eyes: EOMI. No discharge. Cardiovascular: RRR. No JVD. Respiratory: CTA Bilaterally. Normal effort. GI: BS +. Non-distended. Musculoskeletal: Left stump and [name] edema , stable Neurological: She is alert and oriented.  Motor: B/l UE 4+/5 proximal to distal LLE: HF 4-/5 (some pain inhib) [name]: HF 4/5, KE 4/5, ADF 4+/5 (pain inhibition, ?participation) Skin: Amputation site with staples c/di Psychiatric: Less anxious, more appropriate today   Assessment/Plan: 1. Functional deficits secondary to left AKA which require 3+ hours per day of interdisciplinary therapy in a comprehensive inpatient rehab setting. Physiatrist is providing close team supervision and  24 hour management of active medical problems listed below. Physiatrist and rehab team continue to assess barriers to discharge/monitor patient progress toward functional and medical goals.  Function:  Bathing Bathing position   Position: Shower  Bathing parts Body parts bathed by patient: Right arm, Left arm, Chest, Abdomen, Front perineal area, Buttocks, Right upper leg, Left upper leg, Right lower leg Body parts bathed by helper: Back  Bathing assist Assist Level: Touching or steadying assistance(Pt > 75%)      Upper Body Dressing/Undressing Upper body dressing   What is the patient wearing?: Pull over shirt/dress     Pull over shirt/dress - Perfomed by patient: Thread/unthread right sleeve, Thread/unthread left sleeve, Put head through opening, Pull shirt over trunk          Upper body assist Assist Level: Set up      Lower Body Dressing/Undressing Lower body dressing   What is the patient wearing?: Pants, Non-skid slipper socks, Underwear, [employer]  - Performed by patient: Thread/unthread right underwear leg, Thread/unthread left underwear leg, Pull underwear up/down   Pants- Performed by patient: Thread/unthread left pants leg, Pull pants up/down Pants- Performed by helper: Thread/unthread right pants leg   Non-skid slipper socks- Performed by helper: Don/doff right sock               TED Hose - Performed by helper: Don/doff right TED hose  Lower body assist Assist for lower body dressing: Touching or steadying assistance (Pt > 75%)  Toileting Toileting Toileting activity did not occur: No continent bowel/bladder event Toileting steps completed by patient: Performs perineal hygiene Toileting steps completed by helper: Adjust clothing prior to toileting, Adjust clothing after toileting Toileting Assistive Devices: Grab bar or rail  Toileting assist Assist level: Touching or steadying assistance (Pt.75%)   Transfers Chair/bed transfer    Chair/bed transfer method: Lateral scoot Chair/bed transfer assist level: Touching or steadying assistance (Pt > 75%) Chair/bed transfer assistive device: Armrests, Sliding board     Locomotion Ambulation Ambulation activity did not occur: Safety/medical concerns         Wheelchair   Type: Manual Max wheelchair distance: 20 Assist Level: Supervision or verbal cues  Cognition Comprehension Comprehension assist level: Understands complex 90% of the time/cues 10% of the time  Expression Expression assist level: Expresses basic needs/ideas: With no assist  Social Interaction Social Interaction assist level: Interacts appropriately 90% of the time - Needs monitoring or encouragement for participation or interaction.  Problem Solving Problem solving assist level: Solves basic problems with no assist  Memory Memory assist level: Recognizes or recalls 75 - 89% of the time/requires cueing 10 - 24% of the time    Medical Problem List and Plan: 1.  Decreased functional mobility secondary to left AKA 03/21/2705 complicated by septic shock and AKI  Cont CIR 2.  DVT Prophylaxis/Anticoagulation: Subcutaneous heparin 3. Pain Management: Oxycodone as needed  Resumed low-dose-Lyrica 50 mg twice daily given kidney disease and observe for tolerance. (Patient states that she was on 100 mg 3 times daily at home) 4. Mood: Provide emotional support 5. Neuropsych: This patient is capable of making decisions on her own behalf. 6. Skin/Wound Care/sacral decubitus: Routine skin checks and follow-up per WOC for wound recommendations 7. Fluids/Electrolytes/Nutrition: Routine in and outs 8.  AKI in setting of septic shock consistent with ischemic ATN.  Follow-up per renal services.  No indication for hemodialysis at this time.  Baseline creatinine was 0.9-1  Cr 2.34 on 7/12  Labs ordered for tomorrow  Cont to monitor 9.  NSTEMI/atrial fibrillation with RVR.  Discussed plan for cardiac catheterization once  renal function stabilized.  Presently on amiodarone 200 mg daily, Lopressor 12.5 mg twice daily.  No plan to resume Xarelto at this time 10.  Diastolic congestive heart failure.  Monitor for any signs of fluid overload Filed Weights   05/30/18 0555 05/31/18 0537 06/01/18 0442  Weight: 72.1 kg (158 lb 15.2 oz) 72.7 kg (160 lb 4.4 oz) 69.3 kg (152 lb 12.5 oz)   Cont Lasix 80 mg daily  Stable on 7/15 11.  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  Latest hemoglobin A1c 7.2.  SSI.    Patient on Humalog 10 units 3 times daily  Lantus insulin 36 units twice daily prior to admission.    Lantus 10 BID started on 7/8, increased to 12 on 7/11, increased to 14 bid on 7/13  Remains elevated, consider further increase tomorrow if necessary 12.  Acute on chronic anemia.  Ferrous sulfate 325 mg twice daily.    Hb 9.4 on 7/12  Labs ordered for tomorrow  Cont to monitor 13.  Hyperlipidemia.  Crestor 14.  GERD.  Protonix 15. Hypoalbuminemia  Supplement initiated on 7/8 16. Blood pressure    Controlled 7/15   LOS (Days) 10 A FACE TO FACE EVALUATION WAS PERFORMED  Chaslyn Eisen Lorie Phenix 06/01/2018 8:12 AM

## 2018-06-01 NOTE — Progress Notes (Signed)
Occupational Therapy Session Note  Patient Details  Name: [name] MRN: [id number] Date of Birth: 10-05-1966  Today's Date: 06/01/2018 OT Individual Time: 9833-8250 OT Individual Time Calculation (min): 59 min    Short Term Goals: Week 1:  OT Short Term Goal 1 (Week 1): Pt will complete slide board transfer to [facility] with min A OT Short Term Goal 2 (Week 1): Pt will don footwear wiht supervision OT Short Term Goal 3 (Week 1): Pt will complete clothing management during toileting wiht MIN A using lateral leans  Skilled Therapeutic Interventions/Progress Updates:    Pt completed bathing and dressing sit to stand at the EOB.  She completed all UB and LB bathing and dressing with supervision in supine to sit.  Rolling in bed for washing peri area and underpants and pants over hips.  Discussed the need for pt to likely need min assist to complete clothing management in sitting with lateral leans as well.  Therapist performed ace wrapping to the LLE but spouse did not practice.  Discussed with PA Pam the need for sock with waist band for covering the LLE, she reports that she will look into this as well as finding out when the staples can be removed.  After dressing had pt complete two transfers to the commode bench with cut out using the sliding board.  Min assist for first transfer with min guard for second including pt setting up the board herself.  Discussed recommendation with spouse and pt on positioning the bench at the end of the bed to allow for greater room to completed lateral leans side to side.  Finished session with pt in bed and PT present for next session.  Pt's spouse reports that he is ordering a wheelchair cushion and padded commode bench as well.   Therapy Documentation Precautions:  Precautions Precautions: Fall Precaution Comments: telemetry Restrictions Weight Bearing Restrictions: Yes LLE Weight Bearing: Non weight bearing Other Position/Activity Restrictions: NWB LLE at  residual limb   Pain: Pain Assessment Pain Scale: Faces Faces Pain Scale: Hurts a little bit Pain Type: Chronic pain Pain Location: Back Pain Orientation: Lower Pain Descriptors / Indicators: Discomfort Pain Onset: With Activity Pain Intervention(s): Repositioned ADL: See Function Navigator for Current Functional Status.   Therapy/Group: Individual Therapy  Janautica Netzley OTR/L 06/01/2018, 12:18 PM

## 2018-06-01 NOTE — Progress Notes (Signed)
Physical Therapy Session Note  Patient Details  Name: ARANTXA PIERCEY MRN: [id number] Date of Birth: 05/22/1966  Today's Date: 06/01/2018 PT Individual Time: 1000-1100 PT Individual Time Calculation (min): 60 min   Short Term Goals: Week 1:  PT Short Term Goal 1 (Week 1): STG=LTG due to ELOS      Skilled Therapeutic Interventions/Progress Updates:   Pt still has staples in residual limb.  Limb already compression wrapped.  Pt's husband, [name] here for family ed.  He stated that the sedan that they thought they could borrow for d/c is not current on registration.  He drove his father's small truck today; pt stated that she would like to try to transfer into it.  Pt performed actual truck transfer , (uphill approximately 6") with mod assist for set-up of w/c, placement of slide board, placement of foot stool for support under R foot, and  wt shifting across board.  Pt and [name] stated that they have a small foot stool which he will bring from home to use at d/c.  Husband folded w/c, performed truck transfer safely, and pt performed basic level transfer with set-up from him for removing foot rest and handing her the slide board.   Pt propelled w/c on sloping, unlevel concrete outdoors with supervision for safety due to visual deficits, and rare cue for technique. Distance limited by L shoulder pain.   Husband reported that he has pushed the w/c outdoors previously.    Husband pushed pt back to room; they plan to go outdoors again. Safety plan updated for family ed.      Therapy Documentation Precautions:  Precautions Precautions: Fall Precaution Comments: telemetry Restrictions Weight Bearing Restrictions: Yes LLE Weight Bearing: Non weight bearing Other Position/Activity Restrictions: NWB LLE at residual limb   Pain: Pain Assessment Pain Scale: Faces Faces Pain Scale: Hurts a little bit Pain Type: Chronic pain Pain Location: Back Pain Orientation: Lower Pain Descriptors /  Indicators: Discomfort Pain Onset: With Activity Pain Intervention(s): Repositioned     See Function Navigator for Current Functional Status.   Therapy/Group: Individual Therapy  Raequon Catanzaro 06/01/2018, 12:29 PM

## 2018-06-01 NOTE — Progress Notes (Signed)
Occupational Therapy Weekly Progress Note  Patient Details  Name: [name] MRN: [id number] Date of Birth: 1966-05-17  Beginning of progress report period: May 23, 2018 End of progress report period: June 01, 2018  Today's Date: 06/01/2018 OT Individual Time: 0623-7628 OT Individual Time Calculation (min): 34 min    Patient has met 3 of 3 short term goals.  [name] is making steady progress with OT at this time.  Overall she is at a supervision level for bathing and dressing supine to sit as well as min guard for toilet transfers to the padded commode bench.  Shed needs min assist to complete toilet clothing management in sitting with lateral leans side to side. She still exhibits overall limitations in UE strengthening as well as endurance which are being addressed daily during therapy.  Buttocks pain has also been limiting her participation at times when she has had a delay in her medicine.   Family education has been completed with spouse with anticipation of discharge on 7/17 home.  Will continue with current OT POC with some goals downgraded secondary to pt needing occasional supervision/min assist for success.    Patient continues to demonstrate the following deficits: muscle weakness and decreased sitting balance, decreased standing balance and decreased balance strategies and therefore will continue to benefit from skilled OT intervention to enhance overall performance with BADL and Reduce care partner burden.  Patient not progressing toward long term goals.  See goal revision..  Continue plan of care.  OT Short Term Goals Week 2:  OT Short Term Goal 1 (Week 2): Continue working on established LTGs set at supervision overall.   Skilled Therapeutic Interventions/Progress Updates:    Pt completed transfer from bed to wheelchair with min guard assist after placement of the sliding board.  She then was taken down to the dayroom where she completed 8 mins on level 4, reducing to  level 2 after 5 mins on the UE ergonometer.  She was able to maintain RPMs at level 22-29 throughout session but did report increased UE pain and fatigue.  Once completed, took pt back to her room where she continued UE exercises with medium resistance orange therapy band for biceps and triceps 2 sets of 10, while sitting up in the wheelchair.  Pt left with call button in reach and safety alarm belt in place.    Therapy Documentation Precautions:  Precautions Precautions: Fall Precaution Comments: telemetry Restrictions Weight Bearing Restrictions: Yes LLE Weight Bearing: Non weight bearing Other Position/Activity Restrictions: NWB LLE at residual limb  Pain: Pain Assessment Pain Scale: 0-10 Pain Score: 4  Pain Type: Chronic pain Pain Location: Back Pain Orientation: Lower Pain Descriptors / Indicators: Aching Pain Frequency: Intermittent Pain Onset: On-going Patients Stated Pain Goal: 2 Pain Intervention(s): Medication (See eMAR) Multiple Pain Sites: No ADL: See Function Navigator for Current Functional Status.   Therapy/Group: Individual Therapy  Casandra Dallaire OTR/L 06/01/2018, 4:16 PM

## 2018-06-02 ENCOUNTER — Inpatient Hospital Stay (HOSPITAL_COMMUNITY): Payer: Medicare Other

## 2018-06-02 ENCOUNTER — Inpatient Hospital Stay (HOSPITAL_COMMUNITY): Payer: Medicare Other | Admitting: Physical Therapy

## 2018-06-02 ENCOUNTER — Inpatient Hospital Stay (HOSPITAL_COMMUNITY): Payer: Medicare Other | Admitting: Occupational Therapy

## 2018-06-02 LAB — CBC WITH DIFFERENTIAL/PLATELET
BASOS PCT: 1 %
Basophils Absolute: 0.1 10*3/uL (ref 0.0–0.1)
Eosinophils Absolute: 0.3 10*3/uL (ref 0.0–0.7)
Eosinophils Relative: 5 %
HEMATOCRIT: 30.8 % — AB (ref 36.0–46.0)
Hemoglobin: 9 g/dL — ABNORMAL LOW (ref 12.0–15.0)
LYMPHS ABS: 1.7 10*3/uL (ref 0.7–4.0)
Lymphocytes Relative: 24 %
MCH: 26.5 pg (ref 26.0–34.0)
MCHC: 29.2 g/dL — AB (ref 30.0–36.0)
MCV: 90.9 fL (ref 78.0–100.0)
MONO ABS: 0.6 10*3/uL (ref 0.1–1.0)
Monocytes Relative: 9 %
NEUTROS ABS: 4.2 10*3/uL (ref 1.7–7.7)
NEUTROS PCT: 61 %
Platelets: 217 10*3/uL (ref 150–400)
RBC: 3.39 MIL/uL — ABNORMAL LOW (ref 3.87–5.11)
RDW: 21.9 % — AB (ref 11.5–15.5)
WBC: 6.9 10*3/uL (ref 4.0–10.5)

## 2018-06-02 LAB — BASIC METABOLIC PANEL
ANION GAP: 10 (ref 5–15)
BUN: 36 mg/dL — AB (ref 6–20)
CO2: 26 mmol/L (ref 22–32)
CREATININE: 2.28 mg/dL — AB (ref 0.44–1.00)
Calcium: 8.7 mg/dL — ABNORMAL LOW (ref 8.9–10.3)
Chloride: 104 mmol/L (ref 98–111)
GFR calc Af Amer: 27 mL/min — ABNORMAL LOW (ref >60)
GFR calc non Af Amer: 23 mL/min — ABNORMAL LOW (ref >60)
GLUCOSE: 203 mg/dL — AB (ref 70–99)
Potassium: 3.7 mmol/L (ref 3.5–5.1)
Sodium: 140 mmol/L (ref 135–145)

## 2018-06-02 LAB — GLUCOSE, CAPILLARY
GLUCOSE-CAPILLARY: 227 mg/dL — AB (ref 70–99)
Glucose-Capillary: 207 mg/dL — ABNORMAL HIGH (ref 70–99)
Glucose-Capillary: 173 mg/dL — ABNORMAL HIGH (ref 70–99)
Glucose-Capillary: 256 mg/dL — ABNORMAL HIGH (ref 70–99)

## 2018-06-02 MED ORDER — INSULIN GLARGINE 100 UNIT/ML ~~LOC~~ SOLN
18.0000 [IU] | Freq: Two times a day (BID) | SUBCUTANEOUS | Status: DC
  Administered 2018-06-02 – 2018-06-03 (×2): 18 [IU] via SUBCUTANEOUS
  Filled 2018-06-02 (×2): qty 0.18

## 2018-06-02 NOTE — Progress Notes (Signed)
Orthopedic Tech Progress Note Patient Details:  DANILA EDDIE 1966/06/26 [id number]  Patient ID: [name], female   DOB: [birth year], 52 y.o.   MRN: [id number]   Hildred Priest 06/02/2018, 10:20 AM Called in bio-tech brace order; spoke with Bella Kennedy

## 2018-06-02 NOTE — Progress Notes (Addendum)
Physical Therapy Session Note  Patient Details  Name: [name] MRN: [id number] Date of Birth: 09-Nov-1966  Today's Date: 06/02/2018 PT Individual Time: 1100-1200 and 1500-1529 PT Individual Time Calculation (min): 60 min and 29 min  Short Term Goals: Week 1: STG=LTG due to ELOS    Skilled Therapeutic Interventions/Progress Updates: Pt presented in w/c agreeable to therapy. As proceeding to exit room Fritz Pickerel from [employer] arrived with shrinker. Pt performed SB transfer to R with PTA setting up SB and pt performing transfer itself mod I. Pt Pt performed bed mobility mod I with use of bed rail. Shrinker applied and pt instructed in donning of shrinker with pt verbalizing understanding. Pt performed bed mobility and returned to w/c in same manner as prior. Pt required minA for leg rest placement and able to propell to ortho gym mod I with x 1 brief rest due to fatigue. Pt practiced car transfer "uphill" requiring modA for truncal support. Pt required minA for SB placement to return to w/c. Pt propelled to ADL apartment and performed SB transfer to couch. Pt able to place SB and perform transfer without assist to couch however required modA to return to w/c due to lower level and softness of couch required modA for uphill transfer to w/c. Pt propelled back to room and remained in w/c with needs in reach.   Tx2: Pt presented in w/c agreeable to therapy. Propelled to reabh gym mod I and performed SB transfer to mat mod I with increased time due to SB placement. Pt participated in sitting dynamic balance activities reaching/tossing ball with min challenges due to visual deficits. Pt with no LOB with all challenges. Pt also able to perform lateral leans L/R x 5 ea to facilitate SB placement. Pt returned to w/c and transported back to room for time management. Pt remained in w/c at end of session with call bell within reach and needs met.       Therapy Documentation Precautions:   Precautions Precautions: Fall Precaution Comments: telemetry Restrictions Weight Bearing Restrictions: No LLE Weight Bearing: Non weight bearing Other Position/Activity Restrictions: NWB LLE at residual limb General:   Vital Signs: Therapy Vitals Temp: 98 F (36.7 C) Temp Source: Oral Pulse Rate: 64 Resp: 18 BP: (!) 105/56 Patient Position (if appropriate): Sitting Oxygen Therapy SpO2: 100 % O2 Device: Room Air Pain: Pain Assessment Pain Scale: 0-10 Pain Score: 2  Pain Type: Chronic pain Pain Location: Back Pain Orientation: Lower Pain Descriptors / Indicators: Aching Pain Frequency: Intermittent Pain Onset: On-going Patients Stated Pain Goal: 2 Pain Intervention(s): Medication (See eMAR);Repositioned Multiple Pain Sites: No Mobility: Bed Mobility Bed Mobility: Rolling Right;Rolling Left;Supine to Sit Rolling Right: Independent with assistive device Rolling Left: Independent with assistive device Supine to Sit: Independent with assistive device Transfers Transfers: Lateral/Scoot Transfers Lateral/Scoot Transfers: Supervision/Verbal cueing Transfer (Assistive device): Other (Comment)(slide board) Locomotion : Gait Ambulation: No Gait Gait: No Stairs / Additional Locomotion Stairs: No Architect: Yes Wheelchair Assistance: Independent with Camera operator: Both upper extremities Wheelchair Parts Management: Needs assistance Distance: 250  Trunk/Postural Assessment : Cervical Assessment Cervical Assessment: Exceptions to WFL(forward head) Thoracic Assessment Thoracic Assessment: Exceptions to WFL(rounded shoulders) Lumbar Assessment Lumbar Assessment: Exceptions to WFL(posterior pelvic tilt) Postural Control Postural Control: Deficits on evaluation  Balance: Balance Balance Assessed: Yes Static Sitting Balance Static Sitting - Balance Support: No upper extremity supported Static Sitting - Level  of Assistance: 6: Modified independent (Device/Increase time) Dynamic Sitting Balance Sitting balance - Comments:  Performed ball toss with use of BUE and R foot on ground for support Exercises:   Other Treatments:     See Function Navigator for Current Functional Status.   Therapy/Group: Individual Therapy  Novali Vollman  Dorothy Landgrebe, PTA  06/02/2018, 3:39 PM

## 2018-06-02 NOTE — Progress Notes (Signed)
Occupational Therapy Session Note  Patient Details  Name: [name] MRN: [id number] Date of Birth: [birth year]  Today's Date: 06/02/2018 OT Individual Time: 0900-1000 OT Individual Time Calculation (min): 60 min    Short Term Goals: Week 2:  OT Short Term Goal 1 (Week 2): Continue working on established LTGs set at supervision overall.   Skilled Therapeutic Interventions/Progress Updates:    Pt received supine in bed with no c/o pain. Pt L residual limb was wrapped d/t shrinker not yet being delivered. Pt was provided with a handout demo wrapping technique and provided with edu re importance.Pt performed 200+ ft of w/c mobility, with instruction provided re obstacle navigation, community reintegration, and maneuvering around elevators/ramps. 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 Pt was returned to room and left sitting up in w/c with all needs met.   Therapy Documentation Precautions:  Precautions Precautions: Fall Precaution Comments: telemetry Restrictions Weight Bearing Restrictions: No LLE Weight Bearing: Non weight bearing Other Position/Activity Restrictions: NWB LLE at residual limb  Pain: Pain Assessment Pain Scale: 0-10 Pain Score: 0-No pain Faces Pain Scale: No hurt   See Function Navigator for Current Functional Status.   Therapy/Group: Individual Therapy  Curtis Sites 06/02/2018, 11:55 AM

## 2018-06-02 NOTE — Progress Notes (Signed)
Occupational Therapy Discharge Summary  Patient Details  Name: [name] MRN: [id number] Date of Birth: [birth year]  Today's Date: 06/02/2018 OT Individual Time: 1300-1402 OT Individual Time Calculation (min): 62 min   Session Note:  Pt completed toilet transfer to the commode bench with supervision using the sliding board to start session.  She needed min instructional cueing for positioning of the wheelchair in relation to the commode bench but then positioned the board on her own and completed the transfer with supervision.  Lateral leans with supervision side to side with the bed positioned on the left side to allow for greater lean when managing clothing.  Supervision for hygiene as well.  Once finished, she was able to transfer back to the wheelchair at supervision level and rolled herself down to the dayroom.  Once in the day room had pt utilize light orange therapy band for BUE exercises with emphasis on posture and strengthening.  She completed 2 sets of 20 reps for shoulder row, 1 set of 20 reps for elbow extension, and 1 set of 10 reps for shoulder flexion.  Min assist needed to complete repetitions on the left secondary to being slightly weaker.  Educated pt at home to just work on AROM on the [name] for shoulder flexion until 15 reps were completed and then add small increments of hand weights, which she has access to.  Finished session with return to the room via wheelchair with pt left sitting up at bedside with call button and phone in reach.   Patient has met 8 of 8 long term goals due to improved balance and ability to compensate for deficits.  Patient to discharge at overall Supervision level.  Patient's care partner is independent to provide the necessary physical and cognitive assistance at discharge.    Reasons goals not met: Na  Recommendation:  Patient will benefit from ongoing skilled OT services in home health setting to continue to advance functional skills in the area of  BADL and Reduce care partner burden.  Pt still needs supervision for transfers to the drop arm commode and is still reliant on the sliding board as well secondary to [name] weakness.  Feel she will benefit from continued [place] to further progress ADL function so that she can achieve more independence from sit to stand instead of bed level and lateral leans.    Equipment: drop arm commode  Reasons for discharge: treatment goals met and discharge from hospital  Patient/family agrees with progress made and goals achieved: Yes  OT Discharge Precautions/Restrictions  Precautions Precautions: Fall Precaution Comments:   Restrictions Weight Bearing Restrictions: No LLE Weight Bearing: Non weight bearing Other Position/Activity Restrictions: NWB LLE at residual limb    Pain Pain Assessment Pain Scale: Faces Pain Score: 2  Faces Pain Scale: Hurts a little bit Pain Type: Chronic pain Pain Location: Buttocks Pain Descriptors / Indicators: Discomfort Pain Onset: On-going Pain Intervention(s): Repositioned ADL  See Function Section of chart for details  Vision Baseline Vision/History: Legally blind;Retinopathy Patient Visual Report: No change from baseline(Pt with history of visual changes secondary to diabetes) Perception  Perception: Within Functional Limits Praxis Praxis: Intact Cognition Overall Cognitive Status: Within Functional Limits for tasks assessed Arousal/Alertness: Awake/alert Orientation Level: Oriented X4 Attention: Sustained Sustained Attention: Appears intact Selective Attention: Appears intact Memory: Appears intact Awareness: Impaired Awareness Impairment: Anticipatory impairment(improved since eval but still slightly impaired) Safety/Judgment: Impaired Comments: She needs cueing for wheelchair setup and sequencing of safe transfers with use of the sliding board.  Sensation Sensation Light Touch: Appears Intact(sensation intact in BUEs) Coordination Gross  Motor Movements are Fluid and Coordinated: Yes Fine Motor Movements are Fluid and Coordinated: Yes Motor  Motor Motor: Within Functional Limits Motor - Discharge Observations: generalized weakness Mobility  Bed Mobility Bed Mobility: Rolling Right;Rolling Left;Supine to Sit Rolling Right: Independent with assistive device Rolling Left: Independent with assistive device Supine to Sit: Independent with assistive device  Trunk/Postural Assessment  Cervical Assessment Cervical Assessment: Exceptions to [facility] Thoracic Assessment Thoracic Assessment: Exceptions to [facility] Lumbar Assessment Lumbar Assessment: Exceptions to [facility] Postural Control Postural Control: Deficits on evaluation  Balance Balance Balance Assessed: Yes Static Sitting Balance Static Sitting - Balance Support: No upper extremity supported Static Sitting - Level of Assistance: 6: Modified independent (Device/Increase time) Dynamic Sitting Balance Dynamic Sitting - Balance Support: During functional activity Dynamic Sitting - Level of Assistance: 6: Modified independent (Device/Increase time) Sitting balance - Comments: Performed ball toss with use of BUE and R foot on ground for support Extremity/Trunk Assessment RUE Assessment RUE Assessment: Exceptions to [facility] General Strength Comments: AROM WFLS with strength 3+/5 in the shoulders and for elbow flexion and extension.  Grip strength 4/5 [name] Assessment [name] Assessment: Exceptions to [facility] General Strength Comments: AROM WFLS with strength 3/5 in the shoulders and for elbow flexion and extension.  Grip strength 4/5   See Function Navigator for Current Functional Status.  [name] OTR/L 06/02/2018, 4:22 PM

## 2018-06-02 NOTE — Progress Notes (Signed)
Physical Therapy Discharge Summary  Patient Details  Name: [name] MRN: [id number] Date of Birth: 05/30/1966  Today's Date: 06/02/2018   Patient has met 5 of 7 long term goals due to improved activity tolerance, improved balance, improved postural control, increased strength, improved awareness and improved coordination.  Patient to discharge at a wheelchair level Modified Independent.   Patient's care partner is independent to provide the necessary physical assistance at discharge.  Reasons goals not met: Pt unable to perform gait due to weakness and continues to require assist for SB transfers from surfaces with significant height differences such as a truck or low soft furniture.   Recommendation:  Patient will benefit from ongoing skilled PT services in home health setting to continue to advance safe functional mobility, address ongoing impairments in safety, balance, transfers, strength, and minimize fall risk.   Equipment: SB  Reasons for discharge: treatment goals met  Patient/family agrees with progress made and goals achieved: Yes  PT Discharge Vital Signs Therapy Vitals Temp: 98 F (36.7 C) Temp Source: Oral Pulse Rate: 64 Resp: 18 BP: (!) 105/56 Patient Position (if appropriate): Sitting Oxygen Therapy SpO2: 100 % O2 Device: Room Air Pain Pain Assessment Pain Scale: 0-10 Pain Score: 2  Pain Type: Chronic pain Pain Location: Back Pain Orientation: Lower Pain Descriptors / Indicators: Aching Pain Frequency: Intermittent Pain Onset: On-going Patients Stated Pain Goal: 2 Pain Intervention(s): Medication (See eMAR);Repositioned Multiple Pain Sites: No Vision/Perception    baseline impairment.  Motor  Motor Motor: Within Functional Limits  Mobility Bed Mobility Bed Mobility: Rolling Right;Rolling Left;Supine to Sit Rolling Right: Independent with assistive device Rolling Left: Independent with assistive device Supine to Sit: Independent with  assistive device Transfers Transfers: Lateral/Scoot Transfers Lateral/Scoot Transfers: Supervision/Verbal cueing Transfer (Assistive device): Other (Comment)(slide board) Locomotion  Gait Ambulation: No Gait Gait: No Stairs / Additional Locomotion Stairs: No Architect: Yes Wheelchair Assistance: Independent with Camera operator: Both upper extremities Wheelchair Parts Management: Needs assistance Distance: 250  Trunk/Postural Assessment  Cervical Assessment Cervical Assessment: Exceptions to WFL(forward head) Thoracic Assessment Thoracic Assessment: Exceptions to WFL(rounded shoulders) Lumbar Assessment Lumbar Assessment: Exceptions to WFL(posterior pelvic tilt) Postural Control Postural Control: Deficits on evaluation  Balance Balance Balance Assessed: Yes Static Sitting Balance Static Sitting - Balance Support: No upper extremity supported Static Sitting - Level of Assistance: 6: Modified independent (Device/Increase time) Dynamic Sitting Balance Sitting balance - Comments: Performed ball toss with use of BUE and R foot on ground for support Extremity Assessment      [name] Assessment [name] Assessment: Exceptions to [facility] General Strength Comments: 4/5 knee extension 4-/5 knee flexion, 4/5 hip abd/add LLE Assessment LLE Assessment: Exceptions to [facility] General Strength Comments: grossly 4-/5 hip flexion/abd/add   See Function Navigator for Current Functional Status.  Rosita DeChalus 06/02/2018, 4:14 PM

## 2018-06-02 NOTE — Progress Notes (Addendum)
[place] PHYSICAL MEDICINE & REHABILITATION     PROGRESS NOTE  Subjective/Complaints:  Patient seen sitting up at the edge of the bed this morning eating breakfast. She wants to get started with therapies immediately so that she can go home as soon as possible. She wants to know exactly what therapies will expect her to do.  ROS:  Denies CP, SOB, nausea, vomiting, diarrhea  Objective: Vital Signs: Blood pressure (!) 116/44, pulse 70, temperature 97.8 F (36.6 C), temperature source Oral, resp. rate 12, weight 70.1 kg (154 lb 8.7 oz), SpO2 100 %. No results found. Recent Labs    06/02/18 0603  WBC 6.9  HGB 9.0*  HCT 30.8*  PLT 217   Recent Labs    06/02/18 0603  NA 140  K 3.7  CL 104  GLUCOSE 203*  BUN 36*  CREATININE 2.28*  CALCIUM 8.7*   CBG (last 3)  Recent Labs    06/01/18 1624 06/01/18 2233 06/02/18 0628  GLUCAP 208* 190* 207*    Wt Readings from Last 3 Encounters:  06/02/18 70.1 kg (154 lb 8.7 oz)  05/22/18 74.8 kg (164 lb 14.5 oz)  04/20/18 72.6 kg (160 lb)    Physical Exam:  BP (!) 116/44 (BP Location: Right Arm) Comment: rn notified   Pulse 70   Temp 97.8 F (36.6 C) (Oral)   Resp 12   Wt 70.1 kg (154 lb 8.7 oz)   SpO2 100%   BMI 24.20 kg/m  Constitutional: No distress . Vital signs reviewed. HENT: Normocephalic.  Atraumatic. Eyes: EOMI. No discharge. Cardiovascular: RRR. No JVD. Respiratory: CTA bilaterally. Normal effort. GI: BS +. Non-distended. Musculoskeletal: Left stump and [name] edema , stable Neurological: She is alert and oriented.  Motor: B/l UE 4+/5 proximal to distal LLE: HF 4-/5 (some pain inhibition) [name]: HF 4/5, KE 4/5, ADF 4+/5 (some pain inhibition, ?participation) Skin: Amputation site with incision C/D/I Psychiatric: Less anxious, more appropriate today   Assessment/Plan: 1. Functional deficits secondary to left AKA which require 3+ hours per day of interdisciplinary therapy in a comprehensive inpatient rehab  setting. Physiatrist is providing close team supervision and 24 hour management of active medical problems listed below. Physiatrist and rehab team continue to assess barriers to discharge/monitor patient progress toward functional and medical goals.  Function:  Bathing Bathing position   Position: Sitting EOB  Bathing parts Body parts bathed by patient: Right arm, Left arm, Chest, Abdomen, Front perineal area, Buttocks, Right upper leg, Left upper leg, Right lower leg Body parts bathed by helper: Back  Bathing assist Assist Level: Touching or steadying assistance(Pt > 75%)      Upper Body Dressing/Undressing Upper body dressing   What is the patient wearing?: Pull over shirt/dress     Pull over shirt/dress - Perfomed by patient: Thread/unthread right sleeve, Thread/unthread left sleeve, Put head through opening, Pull shirt over trunk          Upper body assist Assist Level: Set up      Lower Body Dressing/Undressing Lower body dressing   What is the patient wearing?: Pants, Non-skid slipper socks, Underwear, [employer]  - Performed by patient: Thread/unthread right underwear leg, Thread/unthread left underwear leg, Pull underwear up/down   Pants- Performed by patient: Thread/unthread right pants leg, Thread/unthread left pants leg, Pull pants up/down Pants- Performed by helper: Thread/unthread right pants leg Non-skid slipper socks- Performed by patient: Don/doff right sock Non-skid slipper socks- Performed by helper: (left sock na)  TED Hose - Performed by helper: Don/doff right TED hose(left TED Na)  Lower body assist Assist for lower body dressing: Supervision or verbal cues      Toileting Toileting Toileting activity did not occur: No continent bowel/bladder event Toileting steps completed by patient: Performs perineal hygiene Toileting steps completed by helper: Adjust clothing prior to toileting, Adjust clothing after toileting Toileting  Assistive Devices: Grab bar or rail  Toileting assist Assist level: Touching or steadying assistance (Pt.75%)   Transfers Chair/bed transfer   Chair/bed transfer method: Lateral scoot Chair/bed transfer assist level: Supervision or verbal cues Chair/bed transfer assistive device: Armrests, Sliding board     Locomotion Ambulation Ambulation activity did not occur: Safety/medical concerns         Wheelchair   Type: Manual Max wheelchair distance: 100 Assist Level: Supervision or verbal cues  Cognition Comprehension Comprehension assist level: Understands complex 90% of the time/cues 10% of the time  Expression Expression assist level: Expresses basic needs/ideas: With no assist  Social Interaction Social Interaction assist level: Interacts appropriately 90% of the time - Needs monitoring or encouragement for participation or interaction.  Problem Solving Problem solving assist level: Solves basic 75 - 89% of the time/requires cueing 10 - 24% of the time  Memory Memory assist level: Recognizes or recalls 75 - 89% of the time/requires cueing 10 - 24% of the time    Medical Problem List and Plan: 1.  Decreased functional mobility secondary to left AKA 02/18/3535 complicated by septic shock and AKI  Cont CIR, plan for DC tomorrow  Will see patient for transitional care management in 1-2 weeks post discharge 2.  DVT Prophylaxis/Anticoagulation: Subcutaneous heparin 3. Pain Management: Oxycodone as needed  Resumed low-dose-Lyrica 50 mg twice daily given kidney disease and observe for tolerance. (Patient states that she was on 100 mg 3 times daily at home) 4. Mood: Provide emotional support 5. Neuropsych: This patient is capable of making decisions on her own behalf. 6. Skin/Wound Care/sacral decubitus: Routine skin checks and follow-up per WOC for wound recommendations 7. Fluids/Electrolytes/Nutrition: Routine in and outs 8.  AKI in setting of septic shock consistent with ischemic ATN.   Follow-up per renal services.  No indication for hemodialysis at this time.  Baseline creatinine was 0.9-1  Cr 2.28 on 7/16, slowly improving  Cont to monitor 9.  NSTEMI/atrial fibrillation with RVR.  Discussed plan for cardiac catheterization once renal function stabilized.  Presently on amiodarone 200 mg daily, Lopressor 12.5 mg twice daily.  No plan to resume Xarelto at this time 10.  Diastolic congestive heart failure.  Monitor for any signs of fluid overload Filed Weights   05/31/18 0537 06/01/18 0442 06/02/18 0316  Weight: 72.7 kg (160 lb 4.4 oz) 69.3 kg (152 lb 12.5 oz) 70.1 kg (154 lb 8.7 oz)   Cont Lasix 80 mg daily  Stable on 7/16 11.  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  Latest hemoglobin A1c 7.2.  SSI.    Patient on Humalog 10 units 3 times daily  Lantus insulin 36 units twice daily prior to admission.    Lantus 10 BID started on 7/8, increased to 12 on 7/11, increased to 14 bid on 7/13, increased to 18 BID on 7/16 12.  Acute on chronic anemia.  Ferrous sulfate 325 mg twice daily.    Hb 9.0 on 7/16  Cont to monitor 13.  Hyperlipidemia.  Crestor 14.  GERD.  Protonix 15. Hypoalbuminemia  Supplement initiated on 7/8 16. Blood pressure  Labile on 7/16   LOS (Days) 11 A FACE TO FACE EVALUATION WAS PERFORMED  Ankit Lorie Phenix 06/02/2018 8:14 AM

## 2018-06-03 LAB — GLUCOSE, CAPILLARY: Glucose-Capillary: 167 mg/dL — ABNORMAL HIGH (ref 70–99)

## 2018-06-03 MED ORDER — NITROGLYCERIN 0.4 MG SL SUBL: 0.4000 mg | SUBLINGUAL_TABLET | SUBLINGUAL | 0 refills | Status: DC | PRN

## 2018-06-03 MED ORDER — AMIODARONE HCL 200 MG PO TABS: 200.0000 mg | ORAL_TABLET | Freq: Every day | ORAL | 0 refills | Status: DC

## 2018-06-03 MED ORDER — INSULIN LISPRO 100 UNIT/ML ~~LOC~~ SOLN: SUBCUTANEOUS | 0 refills | Status: DC

## 2018-06-03 MED ORDER — METOPROLOL TARTRATE 25 MG PO TABS: 12.5000 mg | ORAL_TABLET | Freq: Two times a day (BID) | ORAL | 0 refills | Status: DC

## 2018-06-03 MED ORDER — FUROSEMIDE 80 MG PO TABS: 80.0000 mg | ORAL_TABLET | Freq: Every day | ORAL | 0 refills | Status: DC

## 2018-06-03 MED ORDER — ROSUVASTATIN CALCIUM 20 MG PO TABS: 20.0000 mg | ORAL_TABLET | Freq: Every day | ORAL | 2 refills | Status: DC

## 2018-06-03 MED ORDER — DIPHENOXYLATE-ATROPINE 2.5-0.025 MG PO TABS: 1.0000 | ORAL_TABLET | Freq: Four times a day (QID) | ORAL | 0 refills | Status: DC | PRN

## 2018-06-03 MED ORDER — PREGABALIN 50 MG PO CAPS: 50.0000 mg | ORAL_CAPSULE | Freq: Two times a day (BID) | ORAL | 0 refills | Status: DC

## 2018-06-03 MED ORDER — INSULIN GLARGINE 100 UNIT/ML ~~LOC~~ SOLN: 18.0000 [IU] | Freq: Two times a day (BID) | SUBCUTANEOUS | 11 refills | Status: DC

## 2018-06-03 MED ORDER — OXYCODONE HCL 5 MG PO TABS: 5.0000 mg | ORAL_TABLET | Freq: Four times a day (QID) | ORAL | 0 refills | Status: DC | PRN

## 2018-06-03 MED ORDER — ACETAMINOPHEN 325 MG PO TABS: 650.0000 mg | ORAL_TABLET | Freq: Four times a day (QID) | ORAL | 0 refills | Status: DC | PRN

## 2018-06-03 NOTE — Discharge Instructions (Signed)
Inpatient Rehab Discharge Instructions  ROSALY LABARBERA Discharge date and time: 06/03/18   Activities/Precautions/ Functional Status: Activity: activity as tolerated Diet: cardiac diet and diabetic diet.  Wound Care: Cleanse wound on right thigh with soap and water. Pat dry and pack with silver dressing (Aquacell) and cover with dry dressing. Keep amputation site clean and dry.     Functional status:  ___ No restrictions     ___ Walk up steps independently _x__ 24/7 supervision/assistance   ___ Walk up steps with assistance ___ Intermittent supervision/assistance  ___ Bathe/dress independently ___ Walk with walker     _x__ Bathe/dress with assistance ___ Walk Independently    ___ Shower independently ___ Walk with assistance    ___ Shower with assistance _x__ No alcohol     ___ Return to work/school ________   COMMUNITY REFERRALS UPON DISCHARGE:    Home Health:   PT     OT      RN                     Agency:  [facility] Phone: [phone]   Medical Equipment/Items Ordered: commode and transfer board                                                      Agency/Supplier:  [facility] RESOURCES FOR PATIENT/FAMILY:  Support Groups:  Amputee Support Group (see handout)     Special Instructions: 1. DO NOT USE XARELTO--FOLLOW UP WITH YOUR DOCTOR FOR INPUT.  2. KEEP A FOOD DIARY TO SEE WHAT IRRITATES YOUR STOMACH--[place] WILL CAUSE DIARRHEA.  3. Do not use hydrocodone as now you have a prescription for Oxycodone which is stronger.    My questions have been answered and I understand these instructions. I will adhere to these goals and the provided educational materials after my discharge from the hospital.  Patient/Caregiver Signature _______________________________ Date __________  Clinician Signature _______________________________________ Date __________  Please bring this form and your medication list with you to all your follow-up  doctor's appointments.

## 2018-06-03 NOTE — Progress Notes (Signed)
Social Work  Discharge Note  The overall goal for the admission was met for:   Discharge location: Yes - pt d/c'd home with spouse and family providing all needed assistance  Length of Stay: Yes - 12 days  Discharge activity level: Yes - independent w/c level overall  Home/community participation: Yes  Services provided included: MD, RD, PT, OT, RN, TR, Pharmacy and [place]: Medicare  Follow-up services arranged: Home Health: RN, PT, OT via [facility], DME: drop arm commode and 30" transfer board via [facility] and Patient/Family has no preference for HH/DME agencies  Comments (or additional information): provided info on local Amputee Support Group  Patient/Family verbalized understanding of follow-up arrangements: Yes  Individual responsible for coordination of the follow-up plan: pt  Confirmed correct DME delivered: Aleese Kamps 06/03/2018    Kaitland Lewellyn

## 2018-06-03 NOTE — Progress Notes (Deleted)
Pt. Got d/c paper and equipment.Pt. Is ready to go home with her husband.

## 2018-06-03 NOTE — Progress Notes (Signed)
Pt. And her husband got d/c instructions and equipment.Pt. Is ready to go home.

## 2018-06-04 ENCOUNTER — Telehealth: Payer: Self-pay

## 2018-06-04 NOTE — Telephone Encounter (Signed)
Transitional Care call  Patient name: [name]) DOB: (November 08, 1966) 1. Are you/is patient experiencing any problems since coming home? (STOCKING ISSUE ON AMPUTATION SITE, WILL CALL [employer] FOR ADVISE THEN CALL BACK PATIENT / [place]) a. Are there any questions regarding any aspect of care? (NO) 2. Are there any questions regarding medications administration/dosing? (HAVE NOT PICKED UP, WILL PICK UP TODAY ALL MEDS) a. Are meds being taken as prescribed? (NO) b. "Patient should review meds with caller to confirm"  3. Have there been any falls? (NO) 4. Has Home Health been to the house and/or have they contacted you? (YES) a. If not, have you tried to contact them? (NA) b. Can we help you contact them? (NA) 5. Are bowels and bladder emptying properly? (NO CONSTIPATION PROBLEMS AND DIARRHEA PROBLEMS) a. Are there any unexpected incontinence issues? (NO) b. If applicable, is patient following bowel/bladder programs? (NA) 6. Any fevers, problems with breathing, unexpected pain? (NO) 7. Are there any skin problems or new areas of breakdown? (NO) 8. Has the patient/family member arranged specialty MD follow up (ie cardiology/neurology/renal/surgical/etc.)?  (YES) a. Can we help arrange? (NO) 9. Does the patient need any other services or support that we can help arrange? (NO) 10. Are caregivers following through as expected in assisting the patient? (YES) 11. Has the patient quit smoking, drinking alcohol, or using drugs as recommended? ()  Appointment date/time (06-16-2018/1120am), arrive time (1100) and who it is with here [name] followed by Dr. Posey Pronto on next appointment) [place]

## 2018-06-04 NOTE — Telephone Encounter (Signed)
Placed a call to Ms. Shampine, she reports [facility] didn't have all of her medication on yesterday so she didn't want to  pick up her medication on different days. She wanted to pick up all of her medication on the same day. Spoke with [facility] her Oxycodone and Lyrica is ready, Ms. Kadow told Bruce CMA, she took her Hydrocodone last night. According to the PMP last prescription of hydrocodone was picked up on 04/08/2018. She was instructed to follow her medication as prescribed, to take her Lyrica and Oxycodone as prescribed, she verbalized understanding. She reports her stump pain had increased in intensity we discuss her medication regime and she verbalizes understanding. She was instructed to go to ED if her pain becomes unbearable/ intense or excruciating, she verbalizes underutanding.  Also discuss the patient with Olin Hauser, discharge summary is pending at this time.   Ms. Duhart has chronic diarrhea and constipation, we will continue to monitor.  [employer] was called by Darnell Level, Ms. Peltz was instructed to call [employer] to make an appointment regarding her shrinker, she verbalizes understanding.  Also instructed to call office tomorrow to evaluate, she verbalizes understanding.

## 2018-06-04 NOTE — Telephone Encounter (Signed)
Called [employer] in reguards of the patient and the issue that she is having with the orthotic device.  They stated that she would have to call them and set up an appointment with them in order for them to be able to see what is going on with the device and make corrections to it.  Called and informed patient and [name] about findings.

## 2018-06-04 NOTE — Telephone Encounter (Addendum)
Patient states is having problems with her amputation stocking.  She states it was designed to be held in place by a waist band but it is slipping and not staying in place, states it hurts very much.   Device comes from [employer]

## 2018-06-06 NOTE — Discharge Summary (Signed)
Discharge summary job # [id number]

## 2018-06-07 NOTE — Discharge Summary (Signed)
[name], [name] MEDICAL RECORD [id number] ACCOUNT [id number] DATE OF BIRTH:28-Mar-1966 FACILITY: [facility] LOCATION: [facility] PHYSICIAN:ANKIT PATEL, MD  DISCHARGE SUMMARY  DATE OF DISCHARGE:  06/03/2018  DISCHARGE DIAGNOSES: 1.  Left above-knee amputation 63/87/5643 complicated by septic shock. 2.  Subcutaneous heparin for deep venous thrombosis prophylaxis. 3.  Pain management. 4.  Acute kidney injury in the setting of septic shock, consistent with ischemic acute tubular necrosis. 5.  Non-ST myocardial infarction with atrial fibrillation. 6.  Diastolic congestive heart failure. 7.  Diabetes mellitus with peripheral neuropathy. 8.  Acute on chronic anemia. 9.  Hyperlipidemia. 10.  Gastroesophageal reflux disease.  HISTORY OF PRESENT ILLNESS:  This is a 52 year old right-handed female with history of atrial fibrillation, maintained on Xarelto, diabetes mellitus, peripheral vascular disease.  Lives with spouse.  She used a rolling walker prior to admission.  The  patient's recent admission 03/27/2018 to 03/31/2018 for redo of left femoral to PTA bypass.  Postoperatively needed thrombectomy.  Recent fall.  Vascular surgery.  Findings of occluded bypass graft.  Presented 04/25/2018 with left leg pain, ischemic  changes.  Limb was not felt to be salvageable.  Underwent left AKA 04/26/2018 per [name].  HOSPITAL COURSE:  Pain management.  Noted right buttocks chronic stage III pressure ulcer with dressing changes as directed.  The patient developed septic shock followed by critical care medicine.  Elevated troponin.  Felt to be related to non-ST  elevation MI in the setting of hypotension with followup with cardiology services.  Maintain on intravenous heparin.  The patient developed atrial fibrillation.  Maintain on amiodarone.  Consideration made for possible need for cardiac catheterization;  however, currently on hold due to renal insufficiency.  Echocardiogram with ejection fraction  of 35%, grade II diastolic dysfunction.  Systolic function was moderately to severely reduced.  AKI in the setting of septic shock consistent with ischemic  acute tubular necrosis with CK of 104.9.  Renal function stabilized at 4.90.  She did receive a short run of hemodialysis.  Subcutaneous heparin for DVT prophylaxis.  The patient was admitted for comprehensive rehab program.  PAST MEDICAL HISTORY:  See discharge diagnoses.  SOCIAL HISTORY:  Lives with spouse.  She used a rolling walker prior to admission.  FUNCTIONAL STATUS:  Upon admission to rehab services, was +2 physical assist sit to stand, +2 physical assist supine to sit, mod/max assist with activities of daily living.  PHYSICAL EXAMINATION: VITAL SIGNS:  Blood pressure 119/51, pulse 70, temperature 97, respirations 16. GENERAL:  Alert female in no acute distress.  EOMs intact. NECK:  Supple, nontender, no JVD. CARDIOVASCULAR:  Rate controlled. ABDOMEN:  Soft, nontender, good bowel sounds. LUNGS:  Clear to auscultation without wheeze. EXTREMITIES:  Amputation site was dressed with staples intact.  REHABILITATION HOSPITAL COURSE:  The patient was admitted to inpatient rehab services.  Therapies initiated on a 3-hour daily basis, consisting of physical therapy, occupational therapy and rehabilitation nursing.  The following issues were addressed  during patient's rehabilitation stay.  Pertaining to the patient's left AKA complicated by septic shock, she continued to participate fully with therapies.  Maintained on subcutaneous heparin for DVT prophylaxis.  Acute kidney injury in the setting of  septic shock consistent with ATN.  Follow up renal services.  Creatinine improving at 2.26.  Non-ST MI with atrial fibrillation.  Follow up cardiology services for consideration of cardiac catheterization in the future.  She remained on amiodarone and  Lopressor.  No plan for Xarelto at this time.  She exhibited  no other signs of fluid overload.   Blood sugars controlled.  Hemoglobin A1c of 7.2.  She remained on Lantus insulin, Humalog, diabetic teaching.  Acute on chronic anemia 9.0 and stable.  The  patient received weekly collaborative interdisciplinary team conferences to discuss estimated length of stay, family teaching, any barriers to her discharge.  Working with energy conservation techniques.  Car transfers, performed truck transfers,  moderate assist with education with her husband.  She can propel her wheelchair on level, unlevel, and sloped services with supervision.  Needed some assistance for lower body dressing and grooming.  Full family teaching completed and plan of discharge  to home.  DISCHARGE MEDICATIONS:  At time of dictation included Tylenol as needed, albuterol inhaler 3 times daily as needed, amiodarone 200 mg daily, aspirin 81 mg p.o. daily, Zyrtec daily, Lomotil as needed, ferrous sulfate 325 mg p.o. b.i.d., Lasix 80 mg p.o.  daily, Lantus insulin 18 units b.i.d., metoprolol 12.5 mg b.i.d., multivitamin daily, nitroglycerin as needed, Prilosec 20 mg daily, Zofran 4 mg every 8 hours as needed for nausea, oxycodone 5 mg every 6 hours as needed for pain, need to decrease to 1  pill 3 times a day as needed for a few days, then decrease to twice a day as needed, Lyrica 50 mg p.o. b.i.d., Crestor 20 mg p.o. daily.  The patient would follow up with Dr. Delice Lesch at the outpatient rehab service office as directed; Dr. Ruta Hinds.  Call for appointment;  Dr. Nelva Bush, cardiology services; Dr. Sherril Cong, medical management.  LN/NUANCE D:06/06/2018 T:06/07/2018 JOB:[id number]

## 2018-06-09 ENCOUNTER — Encounter: Payer: Medicare Other | Attending: Physician Assistant | Admitting: Physician Assistant

## 2018-06-09 DIAGNOSIS — Z794 Long term (current) use of insulin: Secondary | ICD-10-CM | POA: Diagnosis not present

## 2018-06-09 DIAGNOSIS — L89313 Pressure ulcer of right buttock, stage 3: Secondary | ICD-10-CM | POA: Insufficient documentation

## 2018-06-09 DIAGNOSIS — F17200 Nicotine dependence, unspecified, uncomplicated: Secondary | ICD-10-CM | POA: Diagnosis not present

## 2018-06-09 DIAGNOSIS — Z89612 Acquired absence of left leg above knee: Secondary | ICD-10-CM | POA: Diagnosis not present

## 2018-06-09 DIAGNOSIS — E11319 Type 2 diabetes mellitus with unspecified diabetic retinopathy without macular edema: Secondary | ICD-10-CM | POA: Insufficient documentation

## 2018-06-16 ENCOUNTER — Ambulatory Visit: Payer: Medicare Other | Admitting: Physician Assistant

## 2018-06-16 ENCOUNTER — Encounter: Payer: Self-pay | Admitting: Registered Nurse

## 2018-06-16 ENCOUNTER — Encounter: Payer: Medicare Other | Attending: Registered Nurse | Admitting: Registered Nurse

## 2018-06-16 VITALS — BP 92/56 | HR 76 | Ht 67.0 in | Wt 157.0 lb

## 2018-06-16 DIAGNOSIS — I480 Paroxysmal atrial fibrillation: Secondary | ICD-10-CM | POA: Diagnosis not present

## 2018-06-16 DIAGNOSIS — I4891 Unspecified atrial fibrillation: Secondary | ICD-10-CM | POA: Diagnosis not present

## 2018-06-16 DIAGNOSIS — I252 Old myocardial infarction: Secondary | ICD-10-CM | POA: Diagnosis not present

## 2018-06-16 DIAGNOSIS — I509 Heart failure, unspecified: Secondary | ICD-10-CM | POA: Insufficient documentation

## 2018-06-16 DIAGNOSIS — K219 Gastro-esophageal reflux disease without esophagitis: Secondary | ICD-10-CM | POA: Insufficient documentation

## 2018-06-16 DIAGNOSIS — G8929 Other chronic pain: Secondary | ICD-10-CM | POA: Diagnosis not present

## 2018-06-16 DIAGNOSIS — E7849 Other hyperlipidemia: Secondary | ICD-10-CM | POA: Diagnosis not present

## 2018-06-16 DIAGNOSIS — Z89612 Acquired absence of left leg above knee: Secondary | ICD-10-CM | POA: Diagnosis present

## 2018-06-16 DIAGNOSIS — F17210 Nicotine dependence, cigarettes, uncomplicated: Secondary | ICD-10-CM | POA: Insufficient documentation

## 2018-06-16 DIAGNOSIS — I214 Non-ST elevation (NSTEMI) myocardial infarction: Secondary | ICD-10-CM

## 2018-06-16 DIAGNOSIS — N189 Chronic kidney disease, unspecified: Secondary | ICD-10-CM | POA: Insufficient documentation

## 2018-06-16 DIAGNOSIS — I2510 Atherosclerotic heart disease of native coronary artery without angina pectoris: Secondary | ICD-10-CM | POA: Insufficient documentation

## 2018-06-16 DIAGNOSIS — E1142 Type 2 diabetes mellitus with diabetic polyneuropathy: Secondary | ICD-10-CM | POA: Diagnosis not present

## 2018-06-16 DIAGNOSIS — G546 Phantom limb syndrome with pain: Secondary | ICD-10-CM | POA: Insufficient documentation

## 2018-06-16 DIAGNOSIS — J45909 Unspecified asthma, uncomplicated: Secondary | ICD-10-CM | POA: Insufficient documentation

## 2018-06-16 DIAGNOSIS — S78112A Complete traumatic amputation at level between left hip and knee, initial encounter: Secondary | ICD-10-CM

## 2018-06-16 DIAGNOSIS — E1122 Type 2 diabetes mellitus with diabetic chronic kidney disease: Secondary | ICD-10-CM | POA: Diagnosis not present

## 2018-06-16 DIAGNOSIS — F329 Major depressive disorder, single episode, unspecified: Secondary | ICD-10-CM | POA: Insufficient documentation

## 2018-06-16 DIAGNOSIS — I5032 Chronic diastolic (congestive) heart failure: Secondary | ICD-10-CM

## 2018-06-16 NOTE — Progress Notes (Signed)
Subjective:    Patient ID: [name], female    DOB: [birth year], 52 y.o.   MRN: [id number]  HPI: [name] is a 52 year old female who is here for transitional care visit in follow up of her left AKA, Type 2 DM with peripheral neuropathy, non- ST elevation, atrial fibrillation, CHF, and other hyperlipidemia. 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 She has an appointment with Dr. Primus Bravo today she reports.   Husband in room all questions answered.   Pain Inventory Average Pain 7 Pain Right Now 8 My pain is intermittent, sharp, burning and stabbing  In the last 24 hours, has pain interfered with the following? General activity 3 Relation with others 2 Enjoyment of life 3 What TIME of day is your pain at its worst? night Sleep (in general) Poor  Pain is worse with: sitting Pain improves with: medication Relief from Meds: 5  Mobility ability to climb steps?  no do you drive?  no  Function disabled: date disabled . I need assistance with the following:  bathing  Neuro/Psych bowel control problems dizziness  Prior Studies Any changes since last visit?  no  Physicians involved in your care Any changes since last visit?  no   Family History  Problem Relation Age of Onset  . Coronary artery disease Mother   . Peripheral vascular disease Mother   . Heart disease Mother        Before age 72  . Other Mother        Venous insuffiency  . Diabetes Mother   . Hyperlipidemia Mother   . Hypertension Mother   . Varicose Veins Mother   . Heart attack Mother        before age 7  . Heart disease Father   . Diabetes Father   . Diabetes Maternal Grandmother   . Diabetes Paternal Grandmother   . Diabetes Paternal Grandfather   . Diabetes Sister   . Hypertension Sister   . Diabetes Brother   . Hypertension Brother    Social History   Socioeconomic History   . Marital status: Married    Spouse name: Not on file  . Number of children: Not on file  . Years of education: Not on file  . Highest education level: Not on file  Occupational History  . Occupation: disabled  Social Needs  . Financial resource strain: Not on file  . Food insecurity:    Worry: Not on file    Inability: Not on file  . Transportation needs:    Medical: Not on file    Non-medical: Not on file  Tobacco Use  . Smoking status: Current Some Day Smoker    Years: 30.00    Types: Cigarettes    Last attempt to quit: 02/07/2017    Years since quitting: 1.3  . Smokeless tobacco: Never Used  Substance and Sexual Activity  . Alcohol use: No    Alcohol/week: 0.0 oz  . Drug use: No  . Sexual activity: Yes    Birth control/protection: Post-menopausal  Lifestyle  . Physical activity:    Days per week: Not on file    Minutes per session: Not on file  . Stress: Not on file  Relationships  . Social connections:    Talks on phone: Not on file    Gets together: Not on file  Attends religious service: Not on file    Active member of club or organization: Not on file    Attends meetings of clubs or organizations: Not on file    Relationship status: Not on file  Other Topics Concern  . Not on file  Social History Narrative  . Not on file   Past Surgical History:  Procedure Laterality Date  . ABDOMINAL AORTAGRAM  June 15, 2014  . ABDOMINAL AORTAGRAM N/A 06/15/2014   Procedure: ABDOMINAL Maxcine Ham;  Surgeon: Serafina Mitchell, MD;  Location: [facility] CATH LAB;  Service: Cardiovascular;  Laterality: N/A;  . ABDOMINAL AORTAGRAM N/A 11/22/2014   Procedure: ABDOMINAL AORTAGRAM;  Surgeon: Serafina Mitchell, MD;  Location: [facility] CATH LAB;  Service: Cardiovascular;  Laterality: N/A;  . ABDOMINAL AORTOGRAM W/LOWER EXTREMITY N/A 01/07/2017   Procedure: Abdominal Aortogram w/Lower Extremity;  Surgeon: Serafina Mitchell, MD;  Location: [place] CV LAB;  Service: Cardiovascular;  Laterality: N/A;    . ABDOMINAL AORTOGRAM W/LOWER EXTREMITY N/A 10/31/2017   Procedure: ABDOMINAL AORTOGRAM W/LOWER EXTREMITY;  Surgeon: Elam Dutch, MD;  Location: [place] CV LAB;  Service: Cardiovascular;  Laterality: N/A;  . ABDOMINAL AORTOGRAM W/LOWER EXTREMITY N/A 03/24/2018   Procedure: ABDOMINAL AORTOGRAM W/LOWER EXTREMITY;  Surgeon: Serafina Mitchell, MD;  Location: [place] CV LAB;  Service: Cardiovascular;  Laterality: N/A;  . AMPUTATION Left 04/26/2018   Procedure: AMPUTATION ABOVE KNEE;  Surgeon: Elam Dutch, MD;  Location: [place];  Service: Vascular;  Laterality: Left;  . AORTA - BILATERAL FEMORAL ARTERY BYPASS GRAFT N/A 11/28/2017   Procedure: AORTA BIFEMORAL BYPASS USING HEMASHIELD GOLD GRAFT & REIMPLANT IMA;  Surgeon: Serafina Mitchell, MD;  Location: [place] [facility] [place] OR;  Service: Vascular;  Laterality: N/A;  . AORTIC ARCH ANGIOGRAPHY N/A 10/31/2017   Procedure: AORTIC ARCH ANGIOGRAPHY;  Surgeon: Elam Dutch, MD;  Location: [place] CV LAB;  Service: Cardiovascular;  Laterality: N/A;  . APPLICATION OF WOUND VAC  11/28/2017   Procedure: APPLICATION OF WOUND VAC;  Surgeon: Serafina Mitchell, MD;  Location: [facility] OR;  Service: Vascular;;  . APPLICATION OF WOUND VAC Left 03/27/2018   Procedure: APPLICATION OF WOUND VAC LEFT GROIN USING PREVENA PLUS;  Surgeon: Serafina Mitchell, MD;  Location: [place];  Service: Vascular;  Laterality: Left;  . ARTERIAL BYPASS SURGERY   07/05/2010   Right Common Femoral to below knee popliteal BPG  . BACK SURGERY     X's  2  . CARDIAC CATHETERIZATION    . CHOLECYSTECTOMY     Gall Bladder  . CYSTECTOMY Left    wrist  . EMBOLECTOMY Left 11/28/2017   Procedure: Left Lower Extremity Embolectomy, Left Tibial Peroneal Trunk Endarterectomy with Patch Angioplasty; Vein Harvest Small Saphenous Graft Left Lower Leg;  Surgeon: [name], MD;  Location: [place];  Service: Vascular;  Laterality: Left;  . FEMORAL-POPLITEAL BYPASS GRAFT Left 03/27/2018   Procedure:  THROMBECTOMY OF LEFT FEMORAL TIBIAL BYPASS;  Surgeon: Serafina Mitchell, MD;  Location: [place];  Service: Vascular;  Laterality: Left;  . FEMORAL-TIBIAL BYPASS GRAFT Left 03/27/2018   Procedure: BYPASS GRAFT FEMORAL-TIBIAL ARTERY LEFT REDO USING CRYOPRESERVED SAPHENOUS VEIN 70cm;  Surgeon: Serafina Mitchell, MD;  Location: [facility] OR;  Service: Vascular;  Laterality: Left;  . INTERCOSTAL NERVE BLOCK  November 2015  . INTRAOPERATIVE ARTERIOGRAM  11/28/2017   Procedure: INTRA OPERATIVE ARTERIOGRAM OF LEFT LEG;  Surgeon: Serafina Mitchell, MD;  Location: [place];  Service: Vascular;;  . INTRAOPERATIVE ARTERIOGRAM Left 03/27/2018  Procedure: INTRA OPERATIVE ARTERIOGRAM TIMES TWO;  Surgeon: Serafina Mitchell, MD;  Location: [place];  Service: Vascular;  Laterality: Left;  . IR FLUORO GUIDE CV LINE RIGHT  03/20/2017  . IR FLUORO GUIDE CV LINE RIGHT  05/05/2018  . IR REMOVAL TUN CV CATH W/O FL  05/20/2018  . IR US GUIDE VASC ACCESS RIGHT  03/20/2017  . IR US GUIDE VASC ACCESS RIGHT  05/05/2018  . IRRIGATION AND DEBRIDEMENT BUTTOCKS Right 09/30/2016   Procedure: DEBRIDEMENT RIGHT  BUTTOCK WOUND;  Surgeon: Georganna Skeans, MD;  Location: [place];  Service: General;  Laterality: Right;  . left foot surgery    . PERIPHERAL VASCULAR CATHETERIZATION N/A 05/07/2016   Procedure: Abdominal Aortogram;  Surgeon: Serafina Mitchell, MD;  Location: [place] CV LAB;  Service: Cardiovascular;  Laterality: N/A;  . PERIPHERAL VASCULAR CATHETERIZATION N/A 05/07/2016   Procedure: Lower Extremity Angiography;  Surgeon: Serafina Mitchell, MD;  Location: [place] CV LAB;  Service: Cardiovascular;  Laterality: N/A;  . PERIPHERAL VASCULAR CATHETERIZATION N/A 05/07/2016   Procedure: Aortic Arch Angiography;  Surgeon: Serafina Mitchell, MD;  Location: [place] CV LAB;  Service: Cardiovascular;  Laterality: N/A;  . PERIPHERAL VASCULAR CATHETERIZATION N/A 05/07/2016   Procedure: Upper Extremity Angiography;  Surgeon: Serafina Mitchell, MD;  Location: [place] CV LAB;  Service: Cardiovascular;  Laterality: N/A;  . PERIPHERAL VASCULAR CATHETERIZATION Right 05/07/2016   Procedure: Peripheral Vascular Balloon Angioplasty;  Surgeon: Serafina Mitchell, MD;  Location: [place] CV LAB;  Service: Cardiovascular;  Laterality: Right;  subclavian  . PERIPHERAL VASCULAR CATHETERIZATION Right 05/07/2016   Procedure: Peripheral Vascular Intervention;  Surgeon: Serafina Mitchell, MD;  Location: [place] CV LAB;  Service: Cardiovascular;  Laterality: Right;  External  Iliac  . SKIN GRAFT Right 2012   [name] by Dr. Nils Pyle- Right and Left Ankle  . SPINE SURGERY    . THROMBECTOMY FEMORAL ARTERY  11/28/2017   Procedure: THROMBECTOMY  & REVISION OF BILATERAL FEMORAL TO POPLETEAL ARTERIES;  Surgeon: Serafina Mitchell, MD;  Location: [facility] OR;  Service: Vascular;;  . TONSILLECTOMY     Past Medical History:  Diagnosis Date  . Abnormal stress test    a. 02/2017 MV: large region of fixed perfusion defect in basal to mid inf, mid-dist inflat walls, EF 43%. No ischemia (EF 55-60% by f/u echo).  . Arthritis   . Asthma   . Chronic back pain   . Coronary artery calcification seen on CT scan    a. 11/2017 CT Abd/Pelvis: Multi vessel coronary vascular Ca2+.  . Depression   . Diabetes mellitus   . Diabetic neuropathy ([place])   . Difficult intubation    DIFFICULT AIRWAY/FYI  . Family history of adverse reaction to anesthesia    mother had difficlty waking   . Femoral-popliteal bypass graft occlusion, left ([place]) 12/02/2017  . GERD (gastroesophageal reflux disease)   . History of echocardiogram    a. 03/2017 Echo: EF 55-60%, mild LVH, nl RV fxn.  . Hyperlipidemia   . 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 PAD (peripheral artery disease) ([place])   . Paroxysmal atrial fibrillation with rapid ventricular response ([place]) 12/02/2017   a. CHA2DS2VASc = 3-->Xarelto.  Marland Kitchen Ulcer    Foot   BP (!) 89/57   Pulse 74   Ht 5\' 7"  (1.702 m)   Wt 157 lb (71.2 kg)   SpO2 96%   BMI  24.59 kg/m   Opioid Risk Score:  Fall Risk Score:  `1  Depression screen PHQ 2/9  Depression screen [facility] 2/9 01/26/2018 11/19/2017 09/04/2017 08/12/2017 05/12/2017 07/18/2016 05/16/2016  Decreased Interest 0 0 0 0 0 0 0  Down, Depressed, Hopeless 0 0 0 0 0 0 0  PHQ - 2 Score 0 0 0 0 0 0 0  Altered sleeping - - - - - - -  Tired, decreased energy - - - - - - -  Change in appetite - - - - - - -  Feeling bad or failure about yourself  - - - - - - -  Trouble concentrating - - - - - - -  Moving slowly or fidgety/restless - - - - - - -  Suicidal thoughts - - - - - - -  PHQ-9 Score - - - - - - -  Difficult doing work/chores - - - - - - -  Some recent data might be hidden     Review of Systems  Constitutional: Negative.   HENT: Negative.   Eyes: Negative.   Respiratory: Negative.   Cardiovascular: Negative.   Gastrointestinal: Positive for constipation and diarrhea.  Endocrine: Negative.   Genitourinary: Negative.   Musculoskeletal: Positive for arthralgias, gait problem and myalgias.  Skin: Negative.   Allergic/Immunologic: Negative.   Neurological: Positive for dizziness.  Hematological: Negative.   Psychiatric/Behavioral: Negative.   All other systems reviewed and are negative.      Objective:   Physical Exam  Constitutional: She is oriented to person, place, and time. She appears well-developed and well-nourished.  HENT:  Head: Normocephalic and atraumatic.  Neck: Normal range of motion. Neck supple.  Cardiovascular: Normal rate and regular rhythm.  Pulmonary/Chest: Effort normal and breath sounds normal.  Musculoskeletal:  Normal Muscle Bulk and Muscle Testing Reveals: Upper Extremities: Right: Full ROM and Muscle Strength 5/5 Left: Decreased ROM 90 Degrees and Muscle Strength 5/5 Right: Full ROM and Muscle Strength 5/5 Left: AKA: Healed  Arrived in wheelchair  Neurological: She is alert and oriented to person, place, and time.  Skin: Skin is warm and dry.    Psychiatric: She has a normal mood and affect. Her behavior is normal.  Nursing note and vitals reviewed.         Assessment & Plan:  1. Left AKA: Vascular Following. [name]. Type 2 DM with Peripheral Neuropathy: Continue current medication regimen. PCP Following.  3. NSTEMI/ Atrial Fibrillation/ CHF: Cardiology Following. Continue Amiodarone.  4. Hyperlipidemia: Continue current medication. PCP Following.   30 minutes of face to face patient care time was spent during this visit. All questions were encouraged and answered.   F/U in 4- 6 weeks with Dr. Posey Pronto

## 2018-06-19 NOTE — Progress Notes (Signed)
[name] ([id number]) Visit Report for 06/09/2018 Abuse/Suicide Risk Screen Details Patient Name: [name], [name]. Date of Service: 06/09/2018 12:30 PM Medical Record Number: [id number] Patient Account Number: [id number] Date of Birth/Sex: 01/30/1966 (52 y.o. F) Treating RN: Montey Hora Primary Care Xavian Hardcastle: Beverlyn Roux Other Clinician: Referring Devina Bezold: Referral, Self Treating Jvion Turgeon/Extender: STONE III, HOYT Weeks in Treatment: 0 Abuse/Suicide Risk Screen Items Answer ABUSE/SUICIDE RISK SCREEN: Has anyone close to you tried to hurt or harm you recentlyo No Do you feel uncomfortable with anyone in your familyo No Has anyone forced you do things that you didnot want to doo No Do you have any thoughts of harming yourselfo No Patient displays signs or symptoms of abuse and/or neglect. No Electronic Signature(s) Signed: 06/15/2018 5:45:45 PM By: Montey Hora Entered By: Montey Hora on 06/09/2018 12:43:26 Secrist, [name] ([id number]) -------------------------------------------------------------------------------- Activities of Daily Living Details Patient Name: [name]. Date of Service: 06/09/2018 12:30 PM Medical Record Number: [id number] Patient Account Number: [id number] Date of Birth/Sex: June 04, 1966 (52 y.o. F) Treating RN: Montey Hora Primary Care Blessin Kanno: Beverlyn Roux Other Clinician: Referring Agnes Probert: Referral, Self Treating Loribeth Katich/Extender: Melburn Hake, HOYT Weeks in Treatment: 0 Activities of Daily Living Items Answer Activities of Daily Living (Please select one for each item) Drive Automobile Not Able Take Medications Need Assistance Use Telephone Need Assistance Care for Appearance Need Assistance Use Toilet Need Assistance Bath / Shower Need Assistance Dress Self Need Assistance Feed Self Need Assistance Walk Need Assistance Get In / Out Bed Need Assistance Housework Need Assistance Prepare Meals Need Assistance Handle Money Need  Assistance Shop for Self Need Assistance Electronic Signature(s) Signed: 06/15/2018 5:45:45 PM By: Montey Hora Entered By: Montey Hora on 06/09/2018 12:44:25 Barclift, [name] ([id number]) -------------------------------------------------------------------------------- Education Assessment Details Patient Name: [name]. Date of Service: 06/09/2018 12:30 PM Medical Record Number: [id number] Patient Account Number: [id number] Date of Birth/Sex: 1966/03/05 (52 y.o. F) Treating RN: Montey Hora Primary Care Shanikqua Zarzycki: Beverlyn Roux Other Clinician: Referring Myla Mauriello: Referral, Self Treating Yazleen Molock/Extender: Melburn Hake, HOYT Weeks in Treatment: 0 Primary Learner Assessed: Caregiver spouse Reason Patient is not Primary Learner: wound location Learning Preferences/Education Level/Primary Language Learning Preference: Explanation, Demonstration Highest Education Level: College or Above Preferred Language: English Cognitive Barrier Assessment/Beliefs Language Barrier: No Translator Needed: No Memory Deficit: No Emotional Barrier: No Cultural/Religious Beliefs Affecting Medical Care: No Physical Barrier Assessment Impaired Vision: No Impaired Hearing: No Decreased Hand dexterity: No Knowledge/Comprehension Assessment Knowledge Level: Medium Comprehension Level: Medium Ability to understand written Medium instructions: Ability to understand verbal Medium instructions: Motivation Assessment Anxiety Level: Calm Cooperation: Cooperative Education Importance: Acknowledges Need Interest in Health Problems: Asks Questions Perception: Coherent Willingness to Engage in Self- Medium Management Activities: Readiness to Engage in Self- Medium Management Activities: Electronic Signature(s) Signed: 06/15/2018 5:45:45 PM By: Montey Hora Entered By: Montey Hora on 06/09/2018 12:45:09 Pagett, Giana Chauncey Cruel  ([id number]) -------------------------------------------------------------------------------- Fall Risk Assessment Details Patient Name: [name]. Date of Service: 06/09/2018 12:30 PM Medical Record Number: [id number] Patient Account Number: [id number] Date of Birth/Sex: [birth year] (52 y.o. F) Treating RN: Montey Hora Primary Care Emmery Seiler: Beverlyn Roux Other Clinician: Referring Lanya Bucks: Referral, Self Treating Tram Wrenn/Extender: Melburn Hake, HOYT Weeks in Treatment: 0 Fall Risk Assessment Items Have you had 2 or more falls in the last 12 monthso 0 No Have you had any fall that resulted in injury in the last 12 monthso 0 No FALL RISK ASSESSMENT: History of falling - immediate or within 3 months 0 No Secondary diagnosis 0 No Ambulatory  aid None/bed rest/wheelchair/nurse 0 No Crutches/cane/walker 15 Yes Furniture 0 No IV Access/Saline Lock 0 No Gait/Training Normal/bed rest/immobile 0 No Weak 10 Yes Impaired 20 Yes Mental Status Oriented to own ability 0 Yes Electronic Signature(s) Signed: 06/15/2018 5:45:45 PM By: Montey Hora Entered By: Montey Hora on 06/09/2018 12:45:32 Lint, [name] ([id number]) -------------------------------------------------------------------------------- Foot Assessment Details Patient Name: [name]. Date of Service: 06/09/2018 12:30 PM Medical Record Number: [id number] Patient Account Number: [id number] Date of Birth/Sex: [birth year] (52 y.o. F) Treating RN: Montey Hora Primary Care Jonthan Leite: Beverlyn Roux Other Clinician: Referring Josuel Koeppen: Referral, Self Treating Douglas Rooks/Extender: STONE III, HOYT Weeks in Treatment: 0 Foot Assessment Items Site Locations + = Sensation present, - = Sensation absent, C = Callus, U = Ulcer R = Redness, W = Warmth, M = Maceration, PU = Pre-ulcerative lesion F = Fissure, S = Swelling, D = Dryness Assessment Right: Left: Other Deformity: No No Prior Foot Ulcer: No No Prior Amputation: No  No Charcot Joint: No No Ambulatory Status: Non-ambulatory Assistance Device: Wheelchair GaitEnergy manager) Signed: 06/15/2018 5:45:45 PM By: Montey Hora Entered By: Montey Hora on 06/09/2018 12:45:55 Vanderloop, Shamel Chauncey Cruel ([id number]) -------------------------------------------------------------------------------- Nutrition Risk Assessment Details Patient Name: [name]. Date of Service: 06/09/2018 12:30 PM Medical Record Number: [id number] Patient Account Number: [id number] Date of Birth/Sex: 1966-09-04 (52 y.o. F) Treating RN: Montey Hora Primary Care Cordel Drewes: Beverlyn Roux Other Clinician: Referring Cedra Villalon: Referral, Self Treating Cymone Yeske/Extender: STONE III, HOYT Weeks in Treatment: 0 Height (in): 67 Weight (lbs): Body Mass Index (BMI): Nutrition Risk Assessment Items NUTRITION RISK SCREEN: I have an illness or condition that made me change the kind and/or amount of 0 No food I eat I eat fewer than two meals per day 0 No I eat few fruits and vegetables, or milk products 0 No I have three or more drinks of beer, liquor or wine almost every day 0 No I have tooth or mouth problems that make it hard for me to eat 0 No I don't always have enough money to buy the food I need 0 No I eat alone most of the time 0 No I take three or more different prescribed or over-the-counter drugs a day 1 Yes Without wanting to, I have lost or gained 10 pounds in the last six months 0 No I am not always physically able to shop, cook and/or feed myself 0 No Nutrition Protocols Good Risk Protocol 0 No interventions needed Moderate Risk Protocol Electronic Signature(s) Signed: 06/15/2018 5:45:45 PM By: Montey Hora Entered By: Montey Hora on 06/09/2018 12:45:38

## 2018-06-23 ENCOUNTER — Encounter: Payer: Medicare Other | Attending: Nurse Practitioner | Admitting: Nurse Practitioner

## 2018-06-23 DIAGNOSIS — Z89612 Acquired absence of left leg above knee: Secondary | ICD-10-CM | POA: Insufficient documentation

## 2018-06-23 DIAGNOSIS — E11622 Type 2 diabetes mellitus with other skin ulcer: Secondary | ICD-10-CM | POA: Insufficient documentation

## 2018-06-23 DIAGNOSIS — L89313 Pressure ulcer of right buttock, stage 3: Secondary | ICD-10-CM | POA: Insufficient documentation

## 2018-06-25 ENCOUNTER — Encounter: Payer: Self-pay | Admitting: Internal Medicine

## 2018-07-03 NOTE — Progress Notes (Signed)
KARAN, INCLAN ([id number]) Visit Report for 06/23/2018 Chief Complaint Document Details Patient Name: [name], [name]. Date of Service: 06/23/2018 2:30 PM Medical Record Number: [id number] Patient Account Number: [id number] Date of Birth/Sex: 02-15-1966 (52 y.o. F) Treating RN: Ahmed Prima Primary Care Provider: Beverlyn Roux Other Clinician: Referring Provider: Beverlyn Roux Treating Provider/Extender: Cathie Olden in Treatment: 2 Information Obtained from: Patient Chief Complaint Right gluteal pressure ulcer Electronic Signature(s) Signed: 06/23/2018 3:04:09 PM By: Lawanda Cousins Entered By: Lawanda Cousins on 06/23/2018 15:04:08 [name], [name] ([id number]) -------------------------------------------------------------------------------- HPI Details Patient Name: [name]. Date of Service: 06/23/2018 2:30 PM Medical Record Number: [id number] Patient Account Number: [id number] Date of Birth/Sex: 04-27-1966 (52 y.o. F) Treating RN: Ahmed Prima Primary Care Provider: Beverlyn Roux Other Clinician: Referring Provider: Beverlyn Roux Treating Provider/Extender: Cathie Olden in Treatment: 2 History of Present Illness HPI Description: 52 year old patient was sent to [place] from [facility] where she was seen by Dr. Sherril Cong for a left ankle ulceration and was recently hospitalized with hypotension and sepsis. She was treated with IV antibiotics and has been scheduled to see Dr. Sharol Given. She was seen by vascular surgery who recommended a femoral bypass but surgery has been delayed until her sacral wound from last year's necrotizing fasciitis has healed. Was seeing the wound care team at [facility] but wanted to change over. She is a smoker and smokes a pack of cigarettes a day The patient was recently admitted in [place] between February 2 and February 14. She had a follow-up to see vascular surgery, orthopedic surgery and infectious disease. During her admission she  was known to have peripheral arterial disease, diabetes mellitus with neuropathy, chronic pain, open wound with necrotizing fasciitis of the sacral area which had been there since November 2017 Past medical history significant for diabetes mellitus, ankle ulcer, sacral ulcer, necrotizing fasciitis, arterial occlusive disease, tobacco abuse. Review of the electronic medical records reveals that Dr. 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 An MRI showed edema in the bone which was consistent with osteomyelitis and the chronic ulcer and may need surgical intervention. She also had a sacral decubitus ulcer which was treated with the wound VAC. On 01/07/2017 she was taken up by the vascular surgeon Dr. Trula Slade for an abdominal aortogram and bilateral lower extremity runoff, for a history of having bilateral femoropopliteal bypass graft as well as external iliac stenting on the right and stenting of her bypass graft. She had developed a nonhealing wound on her right ankle and there was a possibility of a femoral occlusion. the findings were noted and the impression was a surgical revascularization with a aorto bifemoral bypass graft. Both the femoropopliteal bypass grafts were patent. 01/31/2017 -- x-ray of the right hip and pelvis -- IMPRESSION:No radiographic evidence of acute osteomyelitis. Normal-appearing right hip joint space for age. No acute bony abnormality of the hip. 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 Since seeing her last week she has not had an appointment with infectious disease or the orthopedic specialist yet. 02/06/2017 -- the patient missed a couple of appointments yesterday due to the weather but other than that has apparently given up smoking for the last 5 days. 02/13/2017 -- she has rescheduled her  infectious disease appointment and also the appointment with the orthopedic surgeon Dr. Sharol Given  03/06/2017 -- she was seen by Dr. Lucianne Lei dam of infectious disease on 03/05/2017 and he recommendedIV antibiotics but the patient did not want to have that and he has given her Augmentin and doxycycline and will reevaluate her in 2 months time. 03/13/2017 -- he saw Dr. Trula Slade regarding her vascular issues and he would like to wait to the sacral wound is completely closed before considering aortobifemoral bypass graft. He will see her back in 2 months time. She was also seen by Dr. Sharol Given of orthopedics who recommended continue wound care dressings and follow in the office in about 2 months time. he also recommended 3 view radiographs of the right ankle at follow-up. 03/28/2017 -- she did have a PICC line placed and Dr. Lucianne Lei dam has begun IV vancomycin and ceftriaxone. she is going to continue this until she sees him in approximately a month's time 04/18/2017 -- we had applied for a skin substitute for the patient's care but her copayment is going to be about $300 a piece and the patient will not be able to afford this. 05/08/17 on evaluation today patient appears to potentially have a fungal rash in the periwound region of the sacral area. This also seems to extend into the inguinal creases and factional region as well. She is tender to palpation with light rubbing over the region of the periwound and the skin in this region does have a beefy red appearance. Everything seems to point to a fungal infection at this time. She has been tolerating the dressing changes up to this point. There is no evidence of infection otherwise. 05/16/2017 -- was seen by Dr. Rhina Brackett dam of infectious disease -- he saw for the chronic wound over her right ankle [place], [place]. ([id number]) with osteomyelitis of the fibula. He did not think that she is making progress and was not able to examine her sacral decubitus ulcer as  the patient would not allow it. She was switched to Bactrim DS 2 tablets twice a day since finishing her antibiotics. Her ESR was 76 and a CRP was 2.8. 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 05/23/17 on evaluation today patient's wound appears to be doing about the same in regard to the ankle as well as the gluteal area. She apparently has issues with the one back staying in place and she states the sill seems to break up the inferior aspect. She does have home help coming out to apply the wound VAC. She has been tolerating the dressing changes without any problem although she has had some increased pain in the gluteal region due to a fall she sustained and she feels like she brews that area and she had pain actually radiating down her leg which is slowly beginning to improve. There's no evidence of infection. 05/29/17 Unfortunately on evaluation today patient's wounds both in regard to the ankle as well as in regard to her gluteal region on the right appear to be measuring the same as last week's evaluation. She also tells me that she is having soreness today in regard to the right gluteal area because she had a visitor who came to see her a couple days ago and stayed for six hours and she had to set up for that entire length of time visiting. This has called some increased discomfort. Normally she does not set up for those long periods of time. Fortunately there's no  evidence of infection and no worsening of the wounds. 06/13/2017 -- the patient has had a lot of health issues including perfuse diarrhea and generalized weakness and I have instructed her to see her PCP as soon as possible and if things get worse to go to the ER. She has not brought her wound VAC today. She continues to be on oral antibiotics 06/30/17- she is here in follow up evaluation for right ischial and right lateral  malleolus ulcer. She continues with negative pressure wound therapy to the ischial wound. She is currently on a diabetic therapy per infectious disease, although she does state that she inconsistently takes it. She was advised to take her antibiotic as prescribed 07/11/2017 -- she has been unable to use a wound VAC regularly because she loses this evening and it has not been possible for her to use it. 07/18/2017 -- she is much more comfortable and looks better without the wound VAC and at this stage I believe it will be better for her to return the machine and continue with local care. 08/07/2017 -- she is back up to 3 weeks due to a vacation and during this time her wound VAC was returned. She has been doing local dressings 08/15/17 on evaluation today patient appears to be doing about the same in regard to her ankle as well as right gluteal wound. She has been tolerating the dressing changes and tells me that it's mainly her gluteal wound that hurts the ankle is nontender to palpation. She rates this pain to be a one out of 10 at rest and with cleansing of the wound five out of 10 at least. No fevers, chills, nausea, or vomiting noted at this time. She does note that home health has told her to inquire about possibly using silver nitrate on the wound edges due to the road nature of the edges. 08/29/2017 -- -- I got a note which Dr. Lucianne Lei dam sent to Dr. 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 He had recommended definitive surgery to fix this. He recommended to continue with Bactrim Right ankle x-ray -- IMPRESSION: Findings compatible with cellulitis of the lateral malleolar region. A small focus of bony destruction involving the tip of the lateral malleolus is worrisome for osteomyelitis. 09/05/2017 -- the patient was seen by Dr. Meridee Score yesterday who thought her wound looked good and he would continue local  care with silver alginate dressing and follow-up in 4 weeks. He did not recommend any surgical intervention of the ankle until there is good arterial flow and he would await Dr. Stephens Shire opinion regarding this. She was also seen by infectious disease Dr. Lucianne Lei dam, who was concerned about her elevated potassium and took her off Bactrim. he has recommended doxycycline. 09/26/2017 -- she is back after 3 weeks as she was out of town on vacation. She has put on some weight but other than that no change in her health 10/24/2017 -- she was seen by Dr. [name] -- after a thorough review he plans a angiographically via the left femoral approach to evaluate her left subclavian artery and address any stenosis at that time. He will also perform bilateral runoff to confirm that a right femoropopliteal as well as a left femoropopliteal bypass graft remained patent. He is then considering a right axillary to femoral bypass graft for limb salvage. [name], [name] ([id number]) ==== 12/18/17-she is here in follow-up evaluation for multiple wounds. She is s/p  aortobifemoral and left fem-pop revision. She presents with multiple necrotic areas to the left toes and lateral foot. The wounds we were originally seen her for (right heel and left buttock) are improved/stable. According to her husband the wounds to the left foot are "better" than they were upon discharge from the hospital last Tuesday. He states that Dr. Trula Slade saw her on same day of discharge. She has a follow-up with Dr. Trula Slade on Monday. There are dopplerable pulses to the left dorsalis pedis, posterior tibial, peroneal. We communicated with Dr. Stephens Shire nurse regarding today's findings, photos were sent to her email and the patient will maintain her appointment on Monday. We discussed with the patient and her husband to report to [facility] in [place] for any concerns, changes, new areas of necrosis, new areas or progressive ischemia/purple  discoloration, pallor, new/increased pain. We will anticipate follow up next week 12/30/17 patient unfortunately continues to have multiple wound of her lower extremities and specifically her left toes and lateral foot. She does have regular follow-up visits with Dr. Trula Slade who performed her surgery and at this point they are just holding to wait and see what needs to be done in regard to her foot such as what's going to heal and what may need to have potential amputation of further surgery. For this reason we are just trying to maintain and see were things end up at this point. Fortunately patient is not having any significant discomfort. The right lateral malleolus appears to be doing about the same there is some granulation tissue she does have osteomyelitis and not ulcer. 01/06/18 patient presents today for fault evaluation concerning her ongoing issues with her right lateral ankle, right gluteal region, and left lower extremity. Currently regard to left lower extremity it's possible that she may require some amputation although patient states she has the appointment follow-up with Dr. Trula Slade next week. That's when she will find out what he thinks needs to be done at that point. Nonetheless in regard to the right lateral ankle this appears to be doing fairly well today. Her right gluteal region actually appears to be doing fairly well with a good wound surface any slough that was present easily cleaned off with saline and gauze. 01/16/18 on evaluation today patient appears to be doing much better actually in regard to her left foot necrotic areas. A lot of this is starting to liftoff as far as the eschar is concerned and there is a lot of healing underneath which is excellent news. I'm actually pleasantly pleased with the results that she's getting in this regard. Fortunately there does not appear to be any evidence of infection which is great news. Overall she has been tolerating the dressing  changes without complication she does have one new ulcer on the right anterior shin due to a small skin tear which is actually healed but then the adhesive from a Band-Aid that was put on it at a doctor's office actually has 20 skin and she says while he has a small ulcer due to that. Fortunately this is superficial. 01/30/18 on evaluation today patient appears to be doing rather well in regard to her left lower extremity ulcerations as well as her right lateral malleolus and gluteal region. She has been tolerating the dressing changes in general without complication. She did see Dr. Trula Slade her vascular surgeon since I last saw her in the good news is he does not really feel like he's gonna have to perform anything surgical in regard to  deputation. With that being said I am still somewhat concerned about her left fifth toe we will have to see. Nonetheless overall I do believe she's making progress and her right lateral malleolus appears to have at the allies to over at this point the question will be if this stays covered over or not. 02/13/18 on evaluation today patient continues to do rather well in regard to her ulcerations. Everything seems to be doing better her right lateral ankle is still open but just barely. Overall I have been happy with the progress. Patient also is happy she states even before I saw it and said anything she Artie knew it was better just from what she can feel. 02/27/18 on evaluation today patient presents for follow-up concerning her left lower extremity ulcer. She has been tolerating the dressing changes without complication she performs a lot of these on her own although she's not able to really visualize the wound bed due to the fact that she is legally blind. She can see some things movement wise but not much. With that being said this really does not look to be as good as when I saw her two weeks ago on the 29th. We had been slowly seeing the progress in regard to the  eschar regions which were starting to lift up with the Betadine and doing great. Today I'm not seen as much of that and specifically around the great toe on the lateral aspect there appears to be some pus on the line and surrounding the eschar. She also has the same thing noted along with mottled skin in the region of the lateral fifth toe. Unfortunately these digits are also somewhat cyanotic at this point and cool to touch. Her leg is erythematous from around mid calf down to the foot as well. All of this is new since last evaluation two weeks ago. Patient really does not have any discomfort and really notes nothing has changed since her last visit in that regard. She continues to perform a lot of the dressing changes herself at this time. Overall I have been extremely pleased with how things have gone up into this point since her surgery. She does see Dr. Trula Slade on a regular basis on his last check with her things were going well. 03/13/18 on evaluation today patient appears to be doing well in regard to her ischial wound. With that being said unfortunately her left lower extremity appears to have progressed even since I last evaluated her. She did go to the hospital where she had a CT angiogram performed. It did show as part of the study significantly that she had a left femoral popliteal bypass graft [name], [name] ([id number]) which is completely included. Arterial flow to left lower extremity is supplied to the deep femoral branches. On the right the revision of the right femoral popliteal bypass graph is still patent. Unfortunately visually her wounds specifically the necrotic regions of her left foot appear to be worse even compared to my last evaluation with her. Especially the great toe and the fifth toe. Left lower extremity foot ulcers. Specifically 04/10/18 on evaluation today patient presents for follow-up concerning her her left first toe, fourth toe, and fifth toe are affected as  well as the lateral portion of her foot. Since have last seen her she has actually undergone two procedures with vein and vascular. This included a lower extremity bypass with artificial vessels. She seemed to tolerate the surgeries well according what she's telling me today with  no complications and she seems to be doing much better at this point in time which is good news. Her foot show signs of good perfusion. 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 06/09/18 on evaluation today patient appears to be doing much better than when I last saw her. She has been continuing to treat the right gluteal ulcer with fairly good results in fact it's much smaller than when I last saw it. 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 She seems to be doing very well she is definitely feisty during evaluation today. 06/23/18-She is seen in follow up evaluation for a right gluteal ulcer. This is healed and she'll be discharged from wound care services today Electronic Signature(s) Signed: 06/23/2018 3:06:44 PM By: Lawanda Cousins Entered By: Lawanda Cousins on 06/23/2018 15:06:44 Weller, [name] ([id number]) -------------------------------------------------------------------------------- Physician Orders Details Patient Name: [name]. Date of Service: 06/23/2018 2:30 PM Medical Record Number: [id number] Patient Account Number: [id number] Date of Birth/Sex: 1966/01/24 (52 y.o. 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 Electronic Signature(s) Signed: 06/23/2018 3:45:39 PM By: Lawanda Cousins Signed: 06/23/2018 3:52:40 PM By: Alric Quan Entered By: Alric Quan on 06/23/2018 15:04:50 [name], [name] ([id number]) -------------------------------------------------------------------------------- Problem List Details Patient Name: KEIMYA, BRIDDELL. Date of Service: 06/23/2018 2:30 PM Medical Record Number: [id number] Patient Account Number: [id number] Date of Birth/Sex: 05/13/1966 (52 y.o. 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 Date of Service: 06/23/2018 2:30 PM Medical Record Number: [id number] Patient Account Number: [id number] Date of Birth/Sex: [birth year] (52 y.o. F) Treating RN: Ahmed Prima Primary Care Provider: Beverlyn Roux Other Clinician: Referring Provider:  Beverlyn Roux Treating Provider/Extender: Cathie Olden in Treatment: 2 Subjective Chief Complaint Information obtained from Patient Right gluteal pressure ulcer History of Present Illness (HPI) 52 year old patient was sent to [place] from [facility] where she was seen by Dr. Sherril Cong for a left ankle ulceration and was recently hospitalized with hypotension and sepsis. She was treated with IV antibiotics and has been scheduled to see Dr. Sharol Given. She was seen by vascular surgery who recommended a femoral bypass but surgery has been delayed  until her sacral wound from last year's necrotizing fasciitis has healed. Was seeing the wound care team at [facility] but wanted to change over. She is a smoker and smokes a pack of cigarettes a day The patient was recently admitted in [place] between February 2 and February 14. She had a follow-up to see vascular surgery, orthopedic surgery and infectious disease. 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 Review of the electronic medical records reveals that Dr. 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 An MRI showed edema in the bone which was consistent with osteomyelitis and the chronic ulcer and may need surgical intervention. She also had a sacral decubitus ulcer which was treated with the wound VAC. On 01/07/2017 she was taken up by the vascular surgeon Dr. Trula Slade for an abdominal aortogram and bilateral lower extremity runoff, for a history of having bilateral femoropopliteal bypass graft as well as  external iliac stenting on the right and stenting of her bypass graft. She had developed a nonhealing wound on her right ankle and there was a possibility of a femoral occlusion. the findings were noted and the impression was a surgical revascularization with a aorto bifemoral bypass graft. Both the femoropopliteal bypass grafts were patent. 01/31/2017 -- x-ray of the right hip and pelvis -- IMPRESSION:No radiographic evidence of acute osteomyelitis. Normal-appearing right hip joint space for age. No acute bony abnormality of the hip. 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 Since seeing her last week she has not had an appointment with infectious disease or the orthopedic specialist yet. 02/06/2017 -- the patient missed a couple of appointments yesterday due to the weather but other than that has apparently given up smoking for the last 5 days. 02/13/2017 -- she has rescheduled her infectious disease appointment and also the appointment with the orthopedic surgeon Dr. Sharol Given 03/06/2017 -- she was seen by Dr. Lucianne Lei dam of infectious disease on 03/05/2017 and he recommendedIV antibiotics but the patient did not want to have that and he has given her Augmentin and doxycycline and will reevaluate her in 2 months time. 03/13/2017 -- he saw Dr. Trula Slade regarding her vascular issues and he would like to wait to the sacral wound is completely closed before considering aortobifemoral bypass graft. He will see her back in 2 months time. She was also seen by Dr. Sharol Given of orthopedics who recommended continue wound care dressings and follow in the office in about 2 months time. he also recommended 3 view radiographs of the right ankle at follow-up. 03/28/2017 -- she did have a PICC line placed and Dr. Lucianne Lei dam has begun IV vancomycin and ceftriaxone. she is going to continue this until she sees him in approximately a month's time 04/18/2017 -- we had  applied for a skin substitute for the patient's care but her copayment is going to be about $300 a piece and the patient will not be able to afford this. 05/08/17 on evaluation today patient appears to potentially have a fungal rash in the periwound region of the sacral area. This [name], [name] ([id number])  also seems to extend into the inguinal creases and factional region as well. She is tender to palpation with light rubbing over the region of the periwound and the skin in this region does have a beefy red appearance. Everything seems to point to a fungal infection at this time. She has been tolerating the dressing changes up to this point. There is no evidence of infection otherwise. 05/16/2017 -- was seen by Dr. 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 He did not think that she is making progress and was not able to examine her sacral decubitus ulcer as the patient would not allow it. She was switched to Bactrim DS 2 tablets twice a day since finishing her antibiotics. Her ESR was 76 and a CRP was 2.8. 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 05/23/17 on evaluation today patient's wound appears to be doing about the same in regard to the ankle as well as the gluteal area. She apparently has issues with the one back staying in place and she states the sill seems to break up the inferior aspect. She does have home help coming out to apply the wound VAC. She has been tolerating the dressing changes without any problem although she has had some increased pain in the gluteal region due to a fall she sustained and she feels like she brews that area and she had pain actually radiating down her leg which is slowly beginning to improve. There's no evidence of infection. 05/29/17 Unfortunately on evaluation today  patient's wounds both in regard to the ankle as well as in regard to her gluteal region on the right appear to be measuring the same as last week's evaluation. She also tells me that she is having soreness today in regard to the right gluteal area because she had a visitor who came to see her a couple days ago and stayed for six hours and she had to set up for that entire length of time visiting. This has called some increased discomfort. Normally she does not set up for those long periods of time. Fortunately there's no evidence of infection and no worsening of the wounds. 06/13/2017 -- the patient has had a lot of health issues including perfuse diarrhea and generalized weakness and I have instructed her to see her PCP as soon as possible and if things get worse to go to the ER. She has not brought her wound VAC today. She continues to be on oral antibiotics 06/30/17- she is here in follow up evaluation for right ischial and right lateral malleolus ulcer. She continues with negative pressure wound therapy to the ischial wound. She is currently on a diabetic therapy per infectious disease, although she does state that she inconsistently takes it. She was advised to take her antibiotic as prescribed 07/11/2017 -- she has been unable to use a wound VAC regularly because she loses this evening and it has not been possible for her to use it. 07/18/2017 -- she is much more comfortable and looks better without the wound VAC and at this stage I believe it will be better for her to return the machine and continue with local care. 08/07/2017 -- she is back up to 3 weeks due to a vacation and during this time her wound VAC was returned. She has been doing local dressings  08/15/17 on evaluation today patient appears to be doing about the same in regard to her ankle as well as right gluteal wound. She has been tolerating the dressing changes and tells me that it's mainly her gluteal wound that hurts the ankle  is nontender to palpation. She rates this pain to be a one out of 10 at rest and with cleansing of the wound five out of 10 at least. No fevers, chills, nausea, or vomiting noted at this time. She does note that home health has told her to inquire about possibly using silver nitrate on the wound edges due to the road nature of the edges. 08/29/2017 -- -- I got a note which Dr. Lucianne Lei dam sent to Dr. 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 He had recommended definitive surgery to fix this. He recommended to continue with Bactrim Right ankle x-ray -- IMPRESSION: Findings compatible with cellulitis of the lateral malleolar region. A small focus of bony destruction involving the tip of the lateral malleolus is worrisome for osteomyelitis. 09/05/2017 -- the patient was seen by Dr. Meridee Score yesterday who thought her wound looked good and he would continue local care with silver alginate dressing and follow-up in 4 weeks. He did not recommend any surgical intervention of the ankle until there is good arterial flow and he would await Dr. Stephens Shire opinion regarding this. She was also seen by infectious disease Dr. Lucianne Lei dam, who was concerned about her elevated potassium and took her off Bactrim. he has recommended doxycycline. 09/26/2017 -- she is back after 3 weeks as she was out of town on vacation. She has put on some weight but other than that [name], MOM. ([id number]) no change in her health 10/24/2017 -- she was seen by Dr. [name] -- after a thorough review he plans a angiographically via the left femoral approach to evaluate her left subclavian artery and address any stenosis at that time. He will also perform bilateral runoff to confirm that a right femoropopliteal as well as a left femoropopliteal bypass graft remained patent. He is then considering a right axillary to femoral bypass graft for limb  salvage. ==== 12/18/17-she is here in follow-up evaluation for multiple wounds. She is s/p aortobifemoral and left fem-pop revision. She presents with multiple necrotic areas to the left toes and lateral foot. The wounds we were originally seen her for (right heel and left buttock) are improved/stable. According to her husband the wounds to the left foot are "better" than they were upon discharge from the hospital last Tuesday. He states that Dr. Trula Slade saw her on same day of discharge. She has a follow-up with Dr. Trula Slade on Monday. There are dopplerable pulses to the left dorsalis pedis, posterior tibial, peroneal. We communicated with Dr. Stephens Shire nurse regarding today's findings, photos were sent to her email and the patient will maintain her appointment on Monday. We discussed with the patient and her husband to report to [facility] in [place] for any concerns, changes, new areas of necrosis, new areas or progressive ischemia/purple discoloration, pallor, new/increased pain. We will anticipate follow up next week 12/30/17 patient unfortunately continues to have multiple wound of her lower extremities and specifically her left toes and lateral foot. She does have regular follow-up visits with Dr. Trula Slade who performed her surgery and at this point they are just holding to wait and see what needs to be done in regard to her foot such as what's going  to heal and what may need to have potential amputation of further surgery. For this reason we are just trying to maintain and see were things end up at this point. Fortunately patient is not having any significant discomfort. The right lateral malleolus appears to be doing about the same there is some granulation tissue she does have osteomyelitis and not ulcer. 01/06/18 patient presents today for fault evaluation concerning her ongoing issues with her right lateral ankle, right gluteal region, and left lower extremity. Currently regard to left  lower extremity it's possible that she may require some amputation although patient states she has the appointment follow-up with Dr. Trula Slade next week. That's when she will find out what he thinks needs to be done at that point. Nonetheless in regard to the right lateral ankle this appears to be doing fairly well today. Her right gluteal region actually appears to be doing fairly well with a good wound surface any slough that was present easily cleaned off with saline and gauze. 01/16/18 on evaluation today patient appears to be doing much better actually in regard to her left foot necrotic areas. A lot of this is starting to liftoff as far as the eschar is concerned and there is a lot of healing underneath which is excellent news. I'm actually pleasantly pleased with the results that she's getting in this regard. Fortunately there does not appear to be any evidence of infection which is great news. Overall she has been tolerating the dressing changes without complication she does have one new ulcer on the right anterior shin due to a small skin tear which is actually healed but then the adhesive from a Band-Aid that was put on it at a doctor's office actually has 20 skin and she says while he has a small ulcer due to that. Fortunately this is superficial. 01/30/18 on evaluation today patient appears to be doing rather well in regard to her left lower extremity ulcerations as well as her right lateral malleolus and gluteal region. She has been tolerating the dressing changes in general without complication. She did see Dr. Trula Slade her vascular surgeon since I last saw her in the good news is he does not really feel like he's gonna have to perform anything surgical in regard to deputation. With that being said I am still somewhat concerned about her left fifth toe we will have to see. Nonetheless overall I do believe she's making progress and her right lateral malleolus appears to have at the allies to  over at this point the question will be if this stays covered over or not. 02/13/18 on evaluation today patient continues to do rather well in regard to her ulcerations. Everything seems to be doing better her right lateral ankle is still open but just barely. Overall I have been happy with the progress. Patient also is happy she states even before I saw it and said anything she Artie knew it was better just from what she can feel. 02/27/18 on evaluation today patient presents for follow-up concerning her left lower extremity ulcer. She has been tolerating the dressing changes without complication she performs a lot of these on her own although she's not able to really visualize the wound bed due to the fact that she is legally blind. She can see some things movement wise but not much. With that being said this really does not look to be as good as when I saw her two weeks ago on the 29th. We had been slowly seeing  the progress in regard to the eschar regions which were starting to lift up with the Betadine and doing great. Today I'm not seen as much of that and specifically around the great toe on the lateral aspect there appears to be some pus on the line and surrounding the eschar. She also has the same thing noted along with mottled skin in the region of the lateral fifth toe. Unfortunately these digits are also somewhat cyanotic at this point and cool to touch. Her leg is erythematous from around mid calf down to the foot as well. All of this is new since last evaluation two weeks ago. Patient really does not have any discomfort and really notes nothing has changed since her last visit in that regard. She continues to perform a lot of the [name], RIEBE. ([id number]) dressing changes herself at this time. Overall I have been extremely pleased with how things have gone up into this point since her surgery. She does see Dr. Trula Slade on a regular basis on his last check with her things were going  well. 03/13/18 on evaluation today patient appears to be doing well in regard to her ischial wound. With that being said unfortunately her left lower extremity appears to have progressed even since I last evaluated her. She did go to the hospital where she had a CT angiogram performed. It did show as part of the study significantly that she had a left femoral popliteal bypass graft which is completely included. Arterial flow to left lower extremity is supplied to the deep femoral branches. On the right the revision of the right femoral popliteal bypass graph is still patent. Unfortunately visually her wounds specifically the necrotic regions of her left foot appear to be worse even compared to my last evaluation with her. Especially the great toe and the fifth toe. Left lower extremity foot ulcers. Specifically 04/10/18 on evaluation today patient presents for follow-up concerning her her left first toe, fourth toe, and fifth toe are affected as well as the lateral portion of her foot. Since have last seen her she has actually undergone two procedures with vein and vascular. This included a lower extremity bypass with artificial vessels. She seemed to tolerate the surgeries well according what she's telling me today with no complications and she seems to be doing much better at this point in time which is good news. Her foot show signs of good perfusion. 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 06/09/18 on evaluation today patient appears to be doing much better than when I last saw her. She has been continuing to treat the right gluteal ulcer with fairly good results in fact it's much smaller than when I last saw it. 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 She seems to be doing very well she is definitely feisty during evaluation today. 06/23/18-She is seen in follow up evaluation for a right gluteal ulcer. This is healed and she'll be discharged from wound care services today Patient History Information obtained from Patient. Family History Diabetes - Mother,Father,Maternal Grandparents,Paternal Grandparents,Child, Heart Disease - [place] Grandparents, Hereditary Spherocytosis - Mother, Hypertension - Mother,Siblings,Paternal Grandparents,Maternal Grandparents, Seizures -  Maternal Grandparents,Paternal Grandparents, Thyroid Problems - Siblings, No family history of Cancer, Kidney Disease, Lung Disease, Stroke, Tuberculosis. Social History Current every day smoker, Marital Status - Married, Alcohol Use - Rarely, Drug Use - No History, Caffeine Use - Moderate. Medical And Surgical History Notes Eyes diabetic retinopathy Musculoskeletal L AKA Objective [name], [name] ([id number]) Constitutional Vitals Time Taken: 2:39 PM, Height: 66 in, Weight: 157 lbs, BMI: 25.3, Temperature: 98.3 F, Pulse: 66 bpm, Respiratory Rate: 16 breaths/min, Blood Pressure: 92/52 mmHg. Integumentary (Hair, Skin) Wound #13 status is Healed - Epithelialized. Original cause of wound was Pressure Injury. The wound is located on the Right Gluteus. The wound measures 0cm length x 0cm width x 0cm depth; 0cm^2 area and 0cm^3 volume. There is no tunneling or undermining noted. There is a none present amount of drainage noted. The wound margin is flat and intact. There is no granulation within the wound bed. There is no necrotic tissue within the wound bed. The periwound skin appearance exhibited: Scarring. The periwound skin appearance did not exhibit: Callus, Crepitus, Excoriation, Induration, Rash, Dry/Scaly, Maceration, Atrophie Blanche, Cyanosis,  Ecchymosis, Hemosiderin Staining, Mottled, Pallor, Rubor, Erythema. Periwound temperature was noted as No Abnormality. The periwound has tenderness on palpation. 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 Electronic Signature(s) Signed: 06/23/2018 3:07:16 PM By: Lawanda Cousins Entered By: Lawanda Cousins on 06/23/2018 15:07:15 [name], [name] ([id number]) -------------------------------------------------------------------------------- ROS/PFSH Details Patient Name: [name]. Date of Service: 06/23/2018 2:30 PM Medical Record Number: [id number] Patient Account Number: [id number] Date of Birth/Sex: 09-Dec-1965 (52 y.o. F) Treating RN: Ahmed Prima Primary Care Provider: Beverlyn Roux Other Clinician: Referring Provider: Beverlyn Roux Treating Provider/Extender: Cathie Olden in Treatment: 2 Information Obtained From Patient Wound History Do you currently have one or more open woundso Yes How many open wounds do you currently haveo 1 Approximately how long have you had your woundso 1 year How have you been treating your wound(s) until nowo silver alginate Has your wound(s) ever healed and then re-openedo Yes Have you had any lab work done in the past montho Yes Who ordered the lab work doneo [facility] Have you tested positive for an antibiotic resistant organism (MRSA, VRE)o No Have you tested positive for osteomyelitis (bone infection)o No Have you had any tests for circulation on your legso Yes Eyes Medical History: Negative for: Cataracts; Glaucoma; Optic Neuritis Past Medical History Notes: diabetic retinopathy Ear/Nose/Mouth/Throat Medical History: Negative for: Chronic sinus problems/congestion; Middle ear  problems Hematologic/Lymphatic Medical History: Positive for: Anemia Negative for: Hemophilia; Human Immunodeficiency Virus; Lymphedema; Sickle Cell Disease Respiratory Medical History: Negative for: Aspiration; Asthma; Chronic Obstructive Pulmonary Disease (COPD); Pneumothorax; Sleep Apnea; Tuberculosis Cardiovascular Medical History: Positive for: Arrhythmia - a-fib; Coronary Artery Disease; Hypotension; Peripheral Arterial Disease Gastrointestinal Medical History: Negative for: Cirrhosis ; Colitis; Crohnos; Hepatitis A; Hepatitis B; Hepatitis C Endocrine [name], [name] ([id number]) Medical History: Positive for: Type II Diabetes Negative for: Type I Diabetes Treated with: Insulin, Oral agents Blood sugar tested every day: Yes Tested : 4 times a day Blood sugar testing results: Bedtime: 190 Genitourinary Medical History: Negative for: End Stage Renal Disease Immunological Medical History: Negative for: Lupus Erythematosus; Raynaudos; Scleroderma Integumentary (Skin) Medical History: Negative for: History of Burn; History of pressure wounds Musculoskeletal Medical History: Positive  for: Osteoarthritis Negative for: Gout; Rheumatoid Arthritis; Osteomyelitis Past Medical History Notes: L AKA Neurologic Medical History: Negative for: Dementia; Neuropathy; Quadriplegia; Paraplegia; Seizure Disorder Oncologic Medical History: Negative for: Received Chemotherapy; Received Radiation Psychiatric Medical History: Negative for: Anorexia/bulimia; Confinement Anxiety Immunizations Pneumococcal Vaccine: Received Pneumococcal Vaccination: Yes Implantable Devices Family and Social History Cancer: No; Diabetes: Yes - Mother,Father,Maternal Grandparents,Paternal [place]; Heart Disease: Yes - Father,Mother,Maternal Grandparents,Paternal Grandparents; Hereditary Spherocytosis: Yes - Mother; Hypertension: Yes - Mother,Siblings,Paternal Grandparents,Maternal  Grandparents; Kidney Disease: No; Lung Disease: No; Seizures: Yes - Maternal Grandparents,Paternal Grandparents; Stroke: No; Thyroid Problems: Yes - Siblings; Tuberculosis: No; Current every day smoker; Marital Status - Married; Alcohol Use: Rarely; Drug Use: No History; Caffeine Use: Moderate; Financial Celestine, DAVENE JOBIN ([id number]) Concerns: No; Food, Clothing or Shelter Needs: No; Support System Lacking: No; Transportation Concerns: No; Advanced Directives: No; Patient does not want information on Advanced Directives; Do not resuscitate: No; Living Will: No; Medical Power of Attorney: No Physician Affirmation I have reviewed and agree with the above information. Electronic Signature(s) Signed: 06/23/2018 3:45:39 PM By: Lawanda Cousins Signed: 06/23/2018 3:52:40 PM By: Alric Quan Entered By: Lawanda Cousins on 06/23/2018 15:07:01 Isley, Shalawn SMarland Kitchen ([id number]) -------------------------------------------------------------------------------- SuperBill Details Patient Name: [name]. Date of Service: 06/23/2018 Medical Record Number: [id number] Patient Account Number: [id number] Date of Birth/Sex: 1966/08/17 (52 y.o. F) Treating RN: Ahmed Prima Primary Care Provider: Beverlyn Roux Other Clinician: Referring Provider: Beverlyn Roux Treating Provider/Extender: Cathie Olden in Treatment: 2 Diagnosis Coding ICD-10 Codes Code Description L89.313 Pressure ulcer of right buttock, stage 3 Z89.612 Acquired absence of left leg above knee E11.622 Type 2 diabetes mellitus with other skin ulcer Facility Procedures CPT4 Code: 77824235 Description: 99213 - WOUND CARE VISIT-LEV 3 EST PT Modifier: Quantity: 1 Physician Procedures CPT4 Code: 3614431 Description: 54008 - WC PHYS LEVEL 2 - EST PT ICD-10 Diagnosis Description L89.313 Pressure ulcer of right buttock, stage 3 Modifier: Quantity: 1 Electronic Signature(s) Signed: 06/23/2018 3:14:41 PM By: Alric Quan Signed: 06/23/2018  3:45:39 PM By: Lawanda Cousins Previous Signature: 06/23/2018 3:07:31 PM Version By: Lawanda Cousins Entered By: Alric Quan on 06/23/2018 15:14:40

## 2018-07-03 NOTE — Progress Notes (Signed)
Jamie, [name] ([id number]) Visit Report for 06/23/2018 Arrival Information Details Patient Name: [name], [name]. Date of Service: 06/23/2018 2:30 PM Medical Record Number: [id number] Patient Account Number: [id number] Date of Birth/Sex: 1966/10/07 (52 y.o. F) Treating RN: Montey Hora Primary Care Johnasia Liese: Beverlyn Roux Other Clinician: Referring Virda Betters: Beverlyn Roux Treating Raymond Azure/Extender: Cathie Olden in Treatment: 2 Visit Information History Since Last Visit Added or deleted any medications: No Patient Arrived: Wheel Chair Any new allergies or adverse reactions: No Arrival Time: 14:38 Had a fall or experienced change in No Accompanied By: husband activities of daily living that may affect Transfer Assistance: None risk of falls: Patient Identification Verified: Yes Signs or symptoms of abuse/neglect since last visito No Secondary Verification Process Completed: Yes Hospitalized since last visit: No Patient Has Alerts: Yes Implantable device outside of the clinic excluding No Patient Alerts: DMII cellular tissue based products placed in the center since last visit: Has Dressing in Place as Prescribed: Yes Pain Present Now: No Electronic Signature(s) Signed: 06/23/2018 4:05:36 PM By: Montey Hora Entered By: Montey Hora on 06/23/2018 14:39:16 [place], Temitope Chauncey Cruel ([id number]) -------------------------------------------------------------------------------- Clinic Level of Care Assessment Details Patient Name: [name]. Date of Service: 06/23/2018 2:30 PM Medical Record Number: [id number] Patient Account Number: [id number] Date of Birth/Sex: 13-Jun-1966 (52 y.o. F) Treating RN: Ahmed Prima Primary Care Davan Hark: Beverlyn Roux Other Clinician: Referring Tallin Hart: Beverlyn Roux Treating Malaka Ruffner/Extender: Cathie Olden in Treatment: 2 Clinic Level of Care Assessment Items TOOL 4 Quantity Score X - Use when only an EandM is performed on FOLLOW-UP  visit 1 0 ASSESSMENTS - Nursing Assessment / Reassessment X - Reassessment of Co-morbidities (includes updates in patient status) 1 10 X- 1 5 Reassessment of Adherence to Treatment Plan ASSESSMENTS - Wound and Skin Assessment / Reassessment X - Simple Wound Assessment / Reassessment - one wound 1 5 []  - 0 Complex Wound Assessment / Reassessment - multiple wounds []  - 0 Dermatologic / Skin Assessment (not related to wound area) ASSESSMENTS - Focused Assessment []  - Circumferential Edema Measurements - multi extremities 0 []  - 0 Nutritional Assessment / Counseling / Intervention []  - 0 Lower Extremity Assessment (monofilament, tuning fork, pulses) []  - 0 Peripheral Arterial Disease Assessment (using hand held doppler) ASSESSMENTS - Ostomy and/or Continence Assessment and Care []  - Incontinence Assessment and Management 0 []  - 0 Ostomy Care Assessment and Management (repouching, etc.) PROCESS - Coordination of Care []  - Simple Patient / Family Education for ongoing care 0 X- 1 20 Complex (extensive) Patient / Family Education for ongoing care X- 1 10 Staff obtains Programmer, systems, Records, Test Results / Process Orders X- 1 10 Staff telephones HHA, Nursing Homes / Clarify orders / etc []  - 0 Routine Transfer to another Facility (non-emergent condition) []  - 0 Routine Hospital Admission (non-emergent condition) []  - 0 New Admissions / Biomedical engineer / Ordering NPWT, Apligraf, etc. []  - 0 Emergency Hospital Admission (emergent condition) X- 1 10 Simple Discharge Coordination Rubin, Hadasah S. ([id number]) []  - 0 Complex (extensive) Discharge Coordination PROCESS - Special Needs []  - Pediatric / Minor Patient Management 0 []  - 0 Isolation Patient Management []  - 0 Hearing / Language / Visual special needs []  - 0 Assessment of Community assistance (transportation, D/C planning, etc.) []  - 0 Additional assistance / Altered mentation []  - 0 Support Surface(s) Assessment  (bed, cushion, seat, etc.) INTERVENTIONS - Wound Cleansing / Measurement []  - Simple Wound Cleansing - one wound 0 []  - 0 Complex Wound Cleansing - multiple wounds  X- 1 5 Wound Imaging (photographs - any number of wounds) []  - 0 Wound Tracing (instead of photographs) []  - 0 Simple Wound Measurement - one wound []  - 0 Complex Wound Measurement - multiple wounds INTERVENTIONS - Wound Dressings []  - Small Wound Dressing one or multiple wounds 0 []  - 0 Medium Wound Dressing one or multiple wounds []  - 0 Large Wound Dressing one or multiple wounds []  - 0 Application of Medications - topical []  - 0 Application of Medications - injection INTERVENTIONS - Miscellaneous []  - External ear exam 0 []  - 0 Specimen Collection (cultures, biopsies, blood, body fluids, etc.) []  - 0 Specimen(s) / Culture(s) sent or taken to Lab for analysis []  - 0 Patient Transfer (multiple staff / Civil Service fast streamer / Similar devices) []  - 0 Simple Staple / Suture removal (25 or less) []  - 0 Complex Staple / Suture removal (26 or more) []  - 0 Hypo / Hyperglycemic Management (close monitor of Blood Glucose) []  - 0 Ankle / Brachial Index (ABI) - do not check if billed separately X- 1 5 Vital Signs Formanek, Lielle S. ([id number]) Has the patient been seen at the hospital within the last three years: Yes Total Score: 80 Level Of Care: New/Established - Level 3 Electronic Signature(s) Signed: 06/23/2018 3:52:40 PM By: Alric Quan Entered By: Alric Quan on 06/23/2018 15:14:31 Pelster, Zyriah S. ([id number]) -------------------------------------------------------------------------------- Encounter Discharge Information Details Patient Name: [name]. Date of Service: 06/23/2018 2:30 PM Medical Record Number: [id number] Patient Account Number: [id number] Date of Birth/Sex: [birth year] (52 y.o. F) Treating RN: Ahmed Prima Primary Care Torie Towle: Beverlyn Roux Other Clinician: Referring Himmat Enberg: Beverlyn Roux Treating Mikenzie Mccannon/Extender: Cathie Olden in Treatment: 2 Encounter Discharge Information Items Discharge Condition: Stable Ambulatory Status: Wheelchair Discharge Destination: Home Transportation: Private Auto Accompanied By: husband Schedule Follow-up Appointment: No Clinical Summary of Care: Electronic Signature(s) Signed: 06/23/2018 3:52:40 PM By: Alric Quan Entered By: Alric Quan on 06/23/2018 15:05:15 Wilber, Ahjanae S. ([id number]) -------------------------------------------------------------------------------- Lower Extremity Assessment Details Patient Name: [name]. Date of Service: 06/23/2018 2:30 PM Medical Record Number: [id number] Patient Account Number: [id number] Date of Birth/Sex: [birth year] (52 y.o. F) Treating RN: Montey Hora Primary Care Benjiman Sedgwick: Beverlyn Roux Other Clinician: Referring Idelia Caudell: Beverlyn Roux Treating Bayne Fosnaugh/Extender: Cathie Olden in Treatment: 2 Electronic Signature(s) Signed: 06/23/2018 4:05:36 PM By: Montey Hora Entered By: Montey Hora on 06/23/2018 14:47:30 Rogan, Hiyab S. ([id number]) -------------------------------------------------------------------------------- Multi Wound Chart Details Patient Name: KEONNA, RAETHER S. Date of Service: 06/23/2018 2:30 PM Medical Record Number: [id number] Patient Account Number: [id number] Date of Birth/Sex: [birth year] (52 y.o. 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:Pressure Ulcer] [N/A:N/A] Comorbid  History: [13:Anemia, Arrhythmia, Coronary Artery Disease, Hypotension, Peripheral Arterial Disease, Type II Diabetes, Osteoarthritis] [N/A:N/A] Date Acquired: [13:01/19/2018] [N/A:N/A] Weeks of Treatment: [13:2] [N/A:N/A] Wound Status: [13:Open] [N/A:N/A] Measurements L x W x D [13:0.1x0.1x0.1] [N/A:N/A] (cm) Area (cm) : [13:0.008] [N/A:N/A] Volume (cm) : [13:0.001] [N/A:N/A] % Reduction in Area: [13:93.20%] [N/A:N/A] % Reduction in Volume: [13:98.30%] [N/A:N/A] Classification: [13:Category/Stage III] [N/A:N/A] Exudate Amount: [13:None Present] [N/A:N/A] Wound Margin: [13:Flat and Intact] [N/A:N/A] Granulation Amount: [13:None Present (0%)] [N/A:N/A] Necrotic Amount: [13:None Present (0%)] [N/A:N/A] Exposed Structures: [13:Fat Layer (Subcutaneous Tissue) Exposed:  Yes Fascia: No Tendon: No Muscle: No Joint: No Bone: No] [N/A:N/A] Epithelialization: [13:Large (67-100%)] [N/A:N/A] Periwound Skin Texture: [13:Scarring: Yes Excoriation: No Induration: No Callus: No Crepitus: No Rash: No] [N/A:N/A] Periwound Skin Moisture: Maceration: No N/A N/A Dry/Scaly: No Periwound Skin Color: Atrophie Blanche: No N/A N/A Cyanosis: No Ecchymosis: No Erythema: No Hemosiderin Staining: No Mottled: No Pallor: No Rubor: No Temperature: No Abnormality N/A N/A Tenderness on Palpation: Yes N/A N/A Wound Preparation: Ulcer Cleansing: N/A N/A Rinsed/Irrigated with Saline Topical Anesthetic Applied: Other: lidocaine 4% Treatment Notes Electronic Signature(s) Signed: 06/23/2018 3:52:40 PM By: Alric Quan Entered By: Alric Quan on 06/23/2018 14:57:54 Behne, Sheriden Chauncey Cruel ([id number]) -------------------------------------------------------------------------------- [place] Details Patient Name: AUDREANA, HANCOX. Date of Service: 06/23/2018 2:30 PM Medical Record Number: [id number] Patient Account Number: [id number] Date of Birth/Sex: 1966/02/19 (52 y.o. F) Treating RN:  Ahmed Prima Primary Care Sherian Valenza: Beverlyn Roux Other Clinician: Referring Morena Mckissack: Beverlyn Roux Treating Kaleia Longhi/Extender: Cathie Olden in Treatment: 2 Active Inactive Electronic Signature(s) Signed: 06/23/2018 3:52:40 PM By: Alric Quan Entered By: Alric Quan on 06/23/2018 15:05:41 Mcfarlan, Kare S. ([id number]) -------------------------------------------------------------------------------- Pain Assessment Details Patient Name: [name]. Date of Service: 06/23/2018 2:30 PM Medical Record Number: [id number] Patient Account Number: [id number] Date of Birth/Sex: [birth year] (52 y.o. F) Treating RN: Montey Hora Primary Care Reynol Arnone: Beverlyn Roux Other Clinician: Referring Mirza Kidney: Beverlyn Roux Treating Iam Lipson/Extender: Cathie Olden in Treatment: 2 Active Problems Location of Pain Severity and Description of Pain Patient Has Paino Yes Site Locations Pain Location: Generalized Pain With Dressing Change: No Pain Management and Medication Current Pain Management: Electronic Signature(s) Signed: 06/23/2018 4:05:36 PM By: Montey Hora Entered By: Montey Hora on 06/23/2018 14:39:28 Craze, Teila Chauncey Cruel ([id number]) -------------------------------------------------------------------------------- Patient/Caregiver Education Details Patient Name: [name]. Date of Service: 06/23/2018 2:30 PM Medical Record Number: [id number] Patient Account Number: [id number] Date of Birth/Gender: November 13, 1966 (52 y.o. F) Treating RN: Ahmed Prima Primary Care Physician: Beverlyn Roux Other Clinician: Referring Physician: Beverlyn Roux Treating Physician/Extender: Cathie Olden in Treatment: 2 Education Assessment Education Provided To: Patient Education Topics Provided Wound/Skin Impairment: Handouts: Other: Please call our office if you have any questions or concerns. Methods: Explain/Verbal Responses: State content correctly Electronic  Signature(s) Signed: 06/23/2018 3:52:40 PM By: Alric Quan Entered By: Alric Quan on 06/23/2018 15:05:26 Leikam, [place]. ([id number]) -------------------------------------------------------------------------------- Wound Assessment Details Patient Name: [name]. Date of Service: 06/23/2018 2:30 PM Medical Record Number: [id number] Patient Account Number: [id number] Date of Birth/Sex: 1966/02/05 (52 y.o. F) Treating RN: Ahmed Prima Primary Care Areonna Bran: Beverlyn Roux Other Clinician: Referring Karlynn Furrow: Beverlyn Roux Treating Kelle Ruppert/Extender: Cathie Olden in Treatment: 2 Wound Status Wound Number: 13 Primary Pressure Ulcer Etiology: Wound Location: Right Gluteus Wound Healed - Epithelialized Wounding Event: Pressure Injury Status: Date Acquired: 01/19/2018 Comorbid Anemia, Arrhythmia, Coronary Artery Disease, Weeks Of Treatment: 2 History: Hypotension, Peripheral Arterial Disease, Type Clustered Wound: No II Diabetes, Osteoarthritis Photos Photo Uploaded By: Montey Hora on 06/23/2018 16:04:24 Wound Measurements Length: (cm) 0 % Red Width: (cm) 0 % Red Depth: (cm) 0 Epith Area: (cm) 0 Tunn Volume: (cm) 0 Unde uction in Area: 100% uction in Volume: 100% elialization: Large (67-100%) eling: No rmining: No Wound Description Classification: Category/Stage III Foul Wound Margin: Flat and Intact Sloug Exudate Amount: None Present Odor After Cleansing: No h/Fibrino No Wound Bed Granulation Amount: None Present (0%) Exposed Structure Necrotic Amount: None Present (0%) Fascia Exposed: No Fat Layer (Subcutaneous Tissue) Exposed: No Tendon Exposed: No Muscle Exposed: No Joint Exposed: No Bone  Exposed: No Periwound Skin Texture Texture Color No Abnormalities Noted: No No Abnormalities Noted: No Obrien, Mackenzye S. ([id number]) Callus: No Atrophie Blanche: No Crepitus: No Cyanosis: No Excoriation: No Ecchymosis: No Induration:  No Erythema: No Rash: No Hemosiderin Staining: No Scarring: Yes Mottled: No Pallor: No Moisture Rubor: No No Abnormalities Noted: No Dry / Scaly: No Temperature / Pain Maceration: No Temperature: No Abnormality Tenderness on Palpation: Yes Wound Preparation Ulcer Cleansing: Rinsed/Irrigated with Saline Topical Anesthetic Applied: Other: lidocaine 4%, Electronic Signature(s) Signed: 06/23/2018 3:03:46 PM By: Lawanda Cousins Signed: 06/23/2018 3:52:40 PM By: Alric Quan Entered By: Lawanda Cousins on 06/23/2018 15:03:46 Hsu, Ahria S. ([id number]) -------------------------------------------------------------------------------- Vitals Details Patient Name: KIMIYA, BRUNELLE. Date of Service: 06/23/2018 2:30 PM Medical Record Number: [id number] Patient Account Number: [id number] Date of Birth/Sex: [birth year] (52 y.o. F) Treating RN: Montey Hora Primary Care Ellenie Salome: Beverlyn Roux Other Clinician: Referring Telena Peyser: Beverlyn Roux Treating Laila Myhre/Extender: Cathie Olden in Treatment: 2 Vital Signs Time Taken: 14:39 Temperature (F): 98.3 Height (in): 66 Pulse (bpm): 66 Weight (lbs): 157 Respiratory Rate (breaths/min): 16 Body Mass Index (BMI): 25.3 Blood Pressure (mmHg): 92/52 Reference Range: 80 - 120 mg / dl Electronic Signature(s) Signed: 06/23/2018 4:05:36 PM By: Montey Hora Entered By: Montey Hora on 06/23/2018 14:41:27

## 2018-07-21 ENCOUNTER — Encounter

## 2018-07-21 ENCOUNTER — Ambulatory Visit: Payer: Medicare Other | Admitting: Nurse Practitioner

## 2018-07-21 ENCOUNTER — Encounter: Payer: Self-pay | Admitting: Nurse Practitioner

## 2018-07-21 VITALS — BP 96/42 | HR 75 | Ht 67.0 in | Wt 150.8 lb

## 2018-07-21 DIAGNOSIS — I4891 Unspecified atrial fibrillation: Secondary | ICD-10-CM | POA: Diagnosis not present

## 2018-07-21 DIAGNOSIS — Z720 Tobacco use: Secondary | ICD-10-CM

## 2018-07-21 DIAGNOSIS — Z794 Long term (current) use of insulin: Secondary | ICD-10-CM

## 2018-07-21 DIAGNOSIS — I480 Paroxysmal atrial fibrillation: Secondary | ICD-10-CM

## 2018-07-21 DIAGNOSIS — I214 Non-ST elevation (NSTEMI) myocardial infarction: Secondary | ICD-10-CM | POA: Diagnosis not present

## 2018-07-21 DIAGNOSIS — E785 Hyperlipidemia, unspecified: Secondary | ICD-10-CM

## 2018-07-21 DIAGNOSIS — N183 Chronic kidney disease, stage 3 unspecified: Secondary | ICD-10-CM

## 2018-07-21 DIAGNOSIS — I739 Peripheral vascular disease, unspecified: Secondary | ICD-10-CM

## 2018-07-21 DIAGNOSIS — E1122 Type 2 diabetes mellitus with diabetic chronic kidney disease: Secondary | ICD-10-CM

## 2018-07-21 DIAGNOSIS — I255 Ischemic cardiomyopathy: Secondary | ICD-10-CM

## 2018-07-21 DIAGNOSIS — I5032 Chronic diastolic (congestive) heart failure: Secondary | ICD-10-CM

## 2018-07-21 MED ORDER — FUROSEMIDE 80 MG PO TABS: 80.0000 mg | ORAL_TABLET | Freq: Every day | ORAL | 5 refills | Status: DC

## 2018-07-21 MED ORDER — RIVAROXABAN 15 MG PO TABS: 15.0000 mg | ORAL_TABLET | Freq: Every day | ORAL | 3 refills | Status: DC

## 2018-07-21 MED ORDER — AMIODARONE HCL 200 MG PO TABS: 200.0000 mg | ORAL_TABLET | Freq: Every day | ORAL | 2 refills | Status: DC

## 2018-07-21 MED ORDER — METOPROLOL TARTRATE 25 MG PO TABS
12.5000 mg | ORAL_TABLET | Freq: Two times a day (BID) | ORAL | 5 refills | Status: DC
Start: 2018-07-21 — End: 2020-12-02

## 2018-07-21 MED ORDER — ROSUVASTATIN CALCIUM 20 MG PO TABS
20.0000 mg | ORAL_TABLET | Freq: Every day | ORAL | 5 refills | Status: DC
Start: 2018-07-21 — End: 2019-07-23

## 2018-07-21 MED ORDER — RIVAROXABAN 20 MG PO TABS: 20.0000 mg | ORAL_TABLET | Freq: Every day | ORAL | 3 refills | Status: DC

## 2018-07-21 NOTE — Patient Instructions (Addendum)
Medication Instructions:  Your physician has recommended you make the following change in your medication:  1- STOP Aspirin. 2- RESTART Xarelto 15 mg by mouth once a day.    Labwork: Your physician recommends that you return for lab work in: 2 weeks at the [facility]. BMET, CBC. - Please go to the [facility]. You will check in at the front desk to the right as you walk into the atrium. Valet Parking is offered if needed. - On September 17th, 2019.    Testing/Procedures: none  Follow-Up: Your physician recommends that you schedule a follow-up appointment in: [place].   If you need a refill on your cardiac medications before your next appointment, please call your pharmacy.

## 2018-07-21 NOTE — Progress Notes (Signed)
Office Visit    Patient Name: [name] Date of Encounter: 07/21/2018  Primary Care Provider:  Frazier Richards, MD Primary Cardiologist:  Nelva Bush, MD  Chief Complaint    52 year old female who presents for follow-up related to recent non-STEMI with other history including severe peripheral arterial disease status post left AKA, paroxysmal atrial fibrillation, presumed ischemic cardiomyopathy, carotid arterial disease, and ongoing tobacco abuse.  Past Medical History    Past Medical History:  Diagnosis Date  . Abnormal stress test    a. 02/2017 MV: large region of fixed perfusion defect in basal to mid inf, mid-dist inflat walls, EF 43%. No ischemia (EF 55-60% by f/u echo).  . Arthritis   . Asthma   . Carotid arterial disease ([place])    a. 09/2017 Carotid U/S: 40-49% bilat ICA stenosis.  . Chronic back pain   . Coronary artery calcification seen on CT scan    a. 11/2017 CT Abd/Pelvis: Multi vessel coronary vascular Ca2+.  . Depression   . Diabetes mellitus   . Diabetic neuropathy ([place])   . Difficult intubation    DIFFICULT AIRWAY/FYI  . Family history of adverse reaction to anesthesia    mother had difficlty waking   . Femoral-popliteal bypass graft occlusion, left ([place]) 12/02/2017  . GERD (gastroesophageal reflux disease)   . History of echocardiogram    a. 03/2017 Echo: EF 55-60%, mild LVH, nl RV fxn.  . Hyperlipidemia   . Ischemic cardiomyopathy    a. 04/2018 Echo: EF 30-35%, Gr2 DD, mild LVH. Mild MR. Mildly dil LA, mod dil RV w/ mod red RV fxn, mild TR, PASP 72mmHg.  . NSTEMI (non-ST elevated myocardial infarction) ([place])    a. 05/2018 in setting of Afib, sepsis, and post-op L AKA. Peak trop 7.7. EF 30-35% by echo-->cath not performed 2/2 renal failure.  . 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 PAD (peripheral artery disease) ([place])    a. S/p L fem-pop bypass; b. 11/2017 s/p Aortobifem bypass 2/2 graft occlusion; c. 03/2018 L Fem-PTA bypass w/ subsequent  thrombectomy; d. 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  . Paroxysmal atrial fibrillation with rapid ventricular response ([place]) 12/02/2017   a. CHA2DS2VASc = 3-->Xarelto; b. 05/2018 Recurrent Afib-->amio.  Marland Kitchen Ulcer    Foot   Past Surgical History:  Procedure Laterality Date  . ABDOMINAL AORTAGRAM  June 15, 2014  . ABDOMINAL AORTAGRAM N/A 06/15/2014   Procedure: ABDOMINAL Maxcine Ham;  Surgeon: Serafina Mitchell, MD;  Location: [facility] CATH LAB;  Service: Cardiovascular;  Laterality: N/A;  . ABDOMINAL AORTAGRAM N/A 11/22/2014   Procedure: ABDOMINAL AORTAGRAM;  Surgeon: Serafina Mitchell, MD;  Location: [facility] CATH LAB;  Service: Cardiovascular;  Laterality: N/A;  . ABDOMINAL AORTOGRAM W/LOWER EXTREMITY N/A 01/07/2017   Procedure: Abdominal Aortogram w/Lower Extremity;  Surgeon: Serafina Mitchell, MD;  Location: [place] CV LAB;  Service: Cardiovascular;  Laterality: N/A;  . ABDOMINAL AORTOGRAM W/LOWER EXTREMITY N/A 10/31/2017   Procedure: ABDOMINAL AORTOGRAM W/LOWER EXTREMITY;  Surgeon: Elam Dutch, MD;  Location: [place] CV LAB;  Service: Cardiovascular;  Laterality: N/A;  . ABDOMINAL AORTOGRAM W/LOWER EXTREMITY N/A 03/24/2018   Procedure: ABDOMINAL AORTOGRAM W/LOWER EXTREMITY;  Surgeon: Serafina Mitchell, MD;  Location: [place] CV LAB;  Service: Cardiovascular;  Laterality: N/A;  . AMPUTATION Left 04/26/2018   Procedure: AMPUTATION ABOVE KNEE;  Surgeon: Elam Dutch, MD;  Location: [place];  Service: Vascular;  Laterality: Left;  . AORTA - BILATERAL FEMORAL ARTERY BYPASS GRAFT N/A 11/28/2017  Procedure: AORTA BIFEMORAL BYPASS USING HEMASHIELD GOLD GRAFT & REIMPLANT IMA;  Surgeon: Serafina Mitchell, MD;  Location: [facility] OR;  Service: Vascular;  Laterality: N/A;  . AORTIC ARCH ANGIOGRAPHY N/A 10/31/2017   Procedure: AORTIC ARCH ANGIOGRAPHY;  Surgeon: Elam Dutch, MD;  Location: [place] CV LAB;  Service: Cardiovascular;  Laterality: N/A;  . APPLICATION OF WOUND VAC  11/28/2017   Procedure: APPLICATION OF WOUND VAC;   Surgeon: Serafina Mitchell, MD;  Location: [facility] OR;  Service: Vascular;;  . APPLICATION OF WOUND VAC Left 03/27/2018   Procedure: APPLICATION OF WOUND VAC LEFT GROIN USING PREVENA PLUS;  Surgeon: Serafina Mitchell, MD;  Location: [place];  Service: Vascular;  Laterality: Left;  . ARTERIAL BYPASS SURGERY   07/05/2010   Right Common Femoral to below knee popliteal BPG  . BACK SURGERY     X's  2  . CARDIAC CATHETERIZATION    . CHOLECYSTECTOMY     Gall Bladder  . CYSTECTOMY Left    wrist  . EMBOLECTOMY Left 11/28/2017   Procedure: Left Lower Extremity Embolectomy, Left Tibial Peroneal Trunk Endarterectomy with Patch Angioplasty; Vein Harvest Small Saphenous Graft Left Lower Leg;  Surgeon: [name], MD;  Location: [place];  Service: Vascular;  Laterality: Left;  . FEMORAL-POPLITEAL BYPASS GRAFT Left 03/27/2018   Procedure: THROMBECTOMY OF LEFT FEMORAL TIBIAL BYPASS;  Surgeon: Serafina Mitchell, MD;  Location: [place];  Service: Vascular;  Laterality: Left;  . FEMORAL-TIBIAL BYPASS GRAFT Left 03/27/2018   Procedure: BYPASS GRAFT FEMORAL-TIBIAL ARTERY LEFT REDO USING CRYOPRESERVED SAPHENOUS VEIN 70cm;  Surgeon: Serafina Mitchell, MD;  Location: [facility] OR;  Service: Vascular;  Laterality: Left;  . INTERCOSTAL NERVE BLOCK  November 2015  . INTRAOPERATIVE ARTERIOGRAM  11/28/2017   Procedure: INTRA OPERATIVE ARTERIOGRAM OF LEFT LEG;  Surgeon: Serafina Mitchell, MD;  Location: [place];  Service: Vascular;;  . INTRAOPERATIVE ARTERIOGRAM Left 03/27/2018   Procedure: INTRA OPERATIVE ARTERIOGRAM TIMES TWO;  Surgeon: Serafina Mitchell, MD;  Location: [place];  Service: Vascular;  Laterality: Left;  . IR FLUORO GUIDE CV LINE RIGHT  03/20/2017  . IR FLUORO GUIDE CV LINE RIGHT  05/05/2018  . IR REMOVAL TUN CV CATH W/O FL  05/20/2018  . IR US GUIDE VASC ACCESS RIGHT  03/20/2017  . IR US GUIDE VASC ACCESS RIGHT  05/05/2018  . IRRIGATION AND DEBRIDEMENT BUTTOCKS Right 09/30/2016   Procedure: DEBRIDEMENT RIGHT  BUTTOCK WOUND;   Surgeon: Georganna Skeans, MD;  Location: [place];  Service: General;  Laterality: Right;  . left foot surgery    . PERIPHERAL VASCULAR CATHETERIZATION N/A 05/07/2016   Procedure: Abdominal Aortogram;  Surgeon: Serafina Mitchell, MD;  Location: [place] CV LAB;  Service: Cardiovascular;  Laterality: N/A;  . PERIPHERAL VASCULAR CATHETERIZATION N/A 05/07/2016   Procedure: Lower Extremity Angiography;  Surgeon: Serafina Mitchell, MD;  Location: [place] CV LAB;  Service: Cardiovascular;  Laterality: N/A;  . PERIPHERAL VASCULAR CATHETERIZATION N/A 05/07/2016   Procedure: Aortic Arch Angiography;  Surgeon: Serafina Mitchell, MD;  Location: [place] CV LAB;  Service: Cardiovascular;  Laterality: N/A;  . PERIPHERAL VASCULAR CATHETERIZATION N/A 05/07/2016   Procedure: Upper Extremity Angiography;  Surgeon: Serafina Mitchell, MD;  Location: [place] CV LAB;  Service: Cardiovascular;  Laterality: N/A;  . PERIPHERAL VASCULAR CATHETERIZATION Right 05/07/2016   Procedure: Peripheral Vascular Balloon Angioplasty;  Surgeon: Serafina Mitchell, MD;  Location: [place] CV LAB;  Service: Cardiovascular;  Laterality: Right;  subclavian  . PERIPHERAL VASCULAR CATHETERIZATION Right 05/07/2016   Procedure: Peripheral Vascular Intervention;  Surgeon: Serafina Mitchell, MD;  Location: [place] CV LAB;  Service: Cardiovascular;  Laterality: Right;  External  Iliac  . SKIN GRAFT Right 2012   [name] by Dr. Nils Pyle- Right and Left Ankle  . SPINE SURGERY    . THROMBECTOMY FEMORAL ARTERY  11/28/2017   Procedure: THROMBECTOMY  & REVISION OF BILATERAL FEMORAL TO POPLETEAL ARTERIES;  Surgeon: Serafina Mitchell, MD;  Location: [facility] OR;  Service: Vascular;;  . TONSILLECTOMY      Allergies  Allergies  Allergen Reactions  . Lactose Intolerance (Gi)     History of Present Illness    52 year old female with the above complex past medical history including peripheral arterial disease status post multiple upper and lower extremity  interventions, paroxysmal atrial fibrillation, hyperlipidemia, diabetes, depression, GERD, carotid arterial disease, and tobacco abuse.  She has been followed closely by vascular surgery in the setting of severe peripheral arterial disease with history of osteomyelitis and gangrene.  We have seen her in the past for paroxysmal atrial fibrillation for which she was previously managed on low-dose beta-blocker and Xarelto therapy.  She was admitted to [facility] in May in the setting of worsening left leg pain and initially required left femoral to PTA bypass which was complicated by thrombus formation requiring thrombectomy and subsequently left above-the-knee amputation.  In that setting, she also developed sepsis, hypotension, and paroxysmal atrial fibrillation requiring initiation of amiodarone therapy.  She also had a non-STEMI, bumping her troponin to 7.  An echocardiogram was performed during hospitalization and showed new LV dysfunction with an EF of 30 to 35%.  She also developed acute kidney injury and briefly required hemodialysis.  She was seen by our team at [facility] with recommendation for medical therapy in the setting of multiple comorbidities also including anemia.  It was felt that diagnostic catheterization will be appropriate when she had clinical improvement and resolution of renal dysfunction.  Following clinical stability, she was discharged to rehab and then about 2 weeks later discharged home.  Since her discharge, she has done reasonably well.  She says her strength is improving every day.  She has been using a wheelchair but is looking forward to eventual application of a stump shrinker and a prosthesis some day.  She has not been having any chest pain or dyspnea.  She has resumed smoking and is currently smoking a pack a day.  She denies PND, orthopnea, dizziness, syncope, edema, or early satiety.  She had follow-up lab work at [facility] [facility] a few weeks ago which shows improved renal function  with a creatinine of 1.51.  CBC was stable at that time.    Home Medications    Prior to Admission medications   Medication Sig Start Date End Date Taking? Authorizing Provider  amiodarone (PACERONE) 200 MG tablet Take 1 tablet (200 mg total) by mouth daily. 06/03/18 07/21/18 Yes Love, Ivan Anchors, PA-C  aspirin EC 81 MG tablet Take 81 mg by mouth every evening.   Yes [provider]  cetirizine (ZYRTEC) 10 MG tablet Take 10 mg by mouth daily.    Yes [provider]  ferrous sulfate 325 (65 FE) MG tablet Take 1 tablet (325 mg total) by mouth 2 (two) times daily with a meal. 05/22/18 07/21/18 Yes Arrien, Jimmy Picket, MD  furosemide (LASIX) 80 MG tablet Take 1 tablet (80 mg total) by mouth daily. 06/03/18 07/21/18 Yes Love, Olin Hauser  S, PA-C  insulin glargine (LANTUS) 100 UNIT/ML injection Inject 0.18 mLs (18 Units total) into the skin 2 (two) times daily. 06/03/18  Yes Love, Ivan Anchors, PA-C  insulin lispro (HUMALOG) 100 UNIT/ML injection For glucose 121-150 use TWO units,  for 151-200 use FOUR units, for 201-250 use SIX units, for 251-300 use EIGHT units, for 301-350 use TEN units. For blood sugars greater than 350 call MD. 06/03/18  Yes Love, Ivan Anchors, PA-C  loperamide (IMODIUM A-D) 2 MG tablet Take 12 mg by mouth daily as needed for diarrhea or loose stools.   Yes [provider]  metoprolol tartrate (LOPRESSOR) 25 MG tablet Take 0.5 tablets (12.5 mg total) by mouth 2 (two) times daily. 06/03/18  Yes Love, Ivan Anchors, PA-C  nitroGLYCERIN (NITROSTAT) 0.4 MG SL tablet Place 1 tablet (0.4 mg total) under the tongue every 5 (five) minutes as needed for chest pain. 06/03/18  Yes Love, Ivan Anchors, PA-C  omeprazole (PRILOSEC) 20 MG capsule Take 20 mg by mouth daily.   Yes [provider]  ondansetron (ZOFRAN-ODT) 4 MG disintegrating tablet DISSOLVE 1 TABLET IN MOUTH EVERY 8 HOURS AS NEEDED FOR NAUSEA OR VOMITING 04/02/18  Yes [name], MD  pregabalin (LYRICA) 50 MG capsule  Take 1 capsule (50 mg total) by mouth 2 (two) times daily. 06/03/18  Yes Love, Ivan Anchors, PA-C  rosuvastatin (CRESTOR) 20 MG tablet Take 1 tablet (20 mg total) by mouth daily at 6 PM. 06/03/18  Yes Love, Ivan Anchors, PA-C  acetaminophen (TYLENOL) 325 MG tablet Take 2 tablets (650 mg total) by mouth every 6 (six) hours as needed for mild pain or headache. Patient not taking: Reported on 07/21/2018 06/03/18   Love, Ivan Anchors, PA-C  albuterol (PROVENTIL) 2 MG tablet Take 2 mg by mouth 3 (three) times daily as needed for shortness of breath.     [provider]  amitriptyline (ELAVIL) 25 MG tablet TAKE 1 TABLET BY MOUTH AT BEDTIME FOR LEG PAIN 06/25/18   [provider]  diphenoxylate-atropine (LOMOTIL) 2.5-0.025 MG tablet Take 1 tablet by mouth 4 (four) times daily as needed for diarrhea or loose stools. Patient not taking: Reported on 07/21/2018 06/03/18   Love, Ivan Anchors, PA-C  HYDROcodone-acetaminophen (NORCO) 10-325 MG tablet LIMIT 1 2 TO 1 (ONE HALF TO ONE) TABLET BY MOUTH PER DAY OR 2 4 TIMES DAILY IF TOLERATED. NO OXYCODONE. 07/14/18   [provider]  oxyCODONE (OXY IR/ROXICODONE) 5 MG immediate release tablet Take 1 tablet (5 mg total) by mouth every 6 (six) hours as needed for severe pain. Patient not taking: Reported on 07/21/2018 06/03/18   Bary Leriche, PA-C    Review of Systems    Overall recovering well since prolonged hospitalization.  She denies chest pain, palpitations, dyspnea, PND, orthopnea, dizziness, syncope, edema, or early satiety.  All other systems reviewed and are otherwise negative except as noted above.  Physical Exam    VS:  BP (!) 96/42 (BP Location: Right Arm, Patient Position: Sitting, Cuff Size: Normal)   Pulse 75   Ht 5\' 7"  (1.702 m)   Wt 150 lb 12 oz (68.4 kg)   BMI 23.61 kg/m  , BMI Body mass index is 23.61 kg/m. GEN: Well nourished, well developed, in no acute distress. HEENT: Atraumatic.  Legal blindness. Neck: Supple, no JVD, bilateral  carotid bruits-right greater than left, no masses. Cardiac: RRR, 2/6 systolic murmur at the upper sternal borders, no rubs, or gallops. No clubbing, cyanosis, edema of  the right lower extremity.  Left AKA.   right  PT 1 +. Respiratory:  Respirations regular and unlabored, scattered rhonchi. GI: Soft, nontender, nondistended, BS + x 4. MS: no deformity or atrophy. Skin: warm and dry, no rash. Neuro:  Strength and sensation are intact. Psych: Normal affect.  Accessory Clinical Findings    ECG personally reviewed by me today -regular sinus rhythm, 75, delayed R wave progression- no acute changes.  Labs from [facility] dated June 25, 2018  Hemoglobin 13.5, hematocrit 42.4, WBC 6.4, platelets 252 Sodium 142, potassium 4.5, chloride 104, CO2 21, BUN 35, creatinine 1.51, glucose 178, calcium 9.4 Total protein 6.7, albumin 3.8, total bilirubin less than 0.2  Assessment & Plan    1.  Peripheral arterial disease: Status post recent prolonged hospitalization requiring repeat left lower extremity interventions and ultimately left above-the-knee amputation.  She seems to be recovering reasonably well from this and has follow-up with vascular surgery.  2.  Presumed coronary artery disease/recent non-STEMI: Patient suffered a non-STEMI during her hospitalization in the setting of rapid atrial fibrillation, sepsis, hypotension, and postop left AKA.  Echo showed depressed LV function with an EF of 30 to 35%.  It was felt that at some point, ideally should undergo diagnostic catheterization however she was in no position to have that performed at that time.  Since her hospital station, she has not had any chest pain or dyspnea.  Activity has been relatively limited however.  She understands that with clinical improvement and improvement in renal function, that we would very likely consider diagnostic catheterization with a high suspicion for CAD given her multiple risk factors and known multivessel  peripheral arterial disease.  We discussed possibly obtaining labs today and considering setting up diagnostic catheterization for appropriate however, she would like to put it off for another month so that she can recover some more which I think is perfectly reasonable.  We will plan to have her follow-up in about a month at which time we will follow-up lab work and reconsider diagnostic catheterization if appropriate.  She remains on beta-blocker and statin therapy.  She has been on aspirin however working to discontinue this as we are resuming Xarelto.  3.  Paroxysmal atrial fibrillation: This recurred during complex hospitalization in May and June.  She was placed on amiodarone during that admission and remains on 200 mg daily.  She remains in sinus rhythm.  Xarelto was held during that hospitalization in the setting of anemia and complex postoperative course.  Hemoglobin and hematocrit were stable on August 8 at 13.5 and 42.4.  Creatinine was 1.51 at that time.  Creatinine clearance now calculates out to 45.75 at her current weight.  I will resume Xarelto at 15 mg daily.  She will have follow-up CBC and basic metabolic profile at follow-up visit in 1 month.  4.  Type 2 diabetes mellitus: Followed by primary care.  She remains on Lantus.  5.  Tobacco abuse: Unfortunately, since being discharged she went back to smoking a pack a day.  Cessation counseling provided.  She has previously tried Chantix with suicidal ideation.  She does not think she can afford patches.  She recognizes that they cost the same over the course of the month as a pack of cigarettes but she does not have the money all at once to pay for patches were as she can pay for a pack at a time otherwise.  I stressed the importance of cessation.  6.  Relative hypotension:  This is a chronic issue in partially complicated by inaccurate blood pressure recordings in the setting of upper extremity peripheral arterial disease.  She is asymptomatic  and tolerating beta-blocker well.  7.  Hyperlipidemia continue statin therapy.  LDL was 43 in June.  8.  Stage III chronic kidney disease: Complicated by acute kidney injury requiring dialysis during hospitalization.  She did follow-up with nephrology as an outpatient and has been released.  Creatinine 1.51 earlier in August.  We will follow-up prior to considering catheterization next month.  9.  Anemia: Stable per recent labs.  10.  Presumed ICM: EF 30-35% following nstemi this summer.  Euvolemic.  Cont  blocker.  No BP room to consider acei/arb/arni/mra.  11.  Disposition: Follow-up in 1 month with further discussion regarding diagnostic catheterization at that time as well as follow-up labs.  Murray Hodgkins, NP 07/21/2018, 4:45 PM

## 2018-07-29 ENCOUNTER — Other Ambulatory Visit: Payer: Self-pay

## 2018-07-29 ENCOUNTER — Encounter: Payer: Medicare Other | Attending: Registered Nurse | Admitting: Physical Medicine & Rehabilitation

## 2018-07-29 ENCOUNTER — Encounter: Payer: Self-pay | Admitting: Physical Medicine & Rehabilitation

## 2018-07-29 VITALS — BP 87/55 | HR 66 | Ht 67.0 in | Wt 150.0 lb

## 2018-07-29 DIAGNOSIS — I4891 Unspecified atrial fibrillation: Secondary | ICD-10-CM | POA: Diagnosis not present

## 2018-07-29 DIAGNOSIS — E7849 Other hyperlipidemia: Secondary | ICD-10-CM | POA: Insufficient documentation

## 2018-07-29 DIAGNOSIS — G546 Phantom limb syndrome with pain: Secondary | ICD-10-CM | POA: Insufficient documentation

## 2018-07-29 DIAGNOSIS — E1122 Type 2 diabetes mellitus with diabetic chronic kidney disease: Secondary | ICD-10-CM | POA: Insufficient documentation

## 2018-07-29 DIAGNOSIS — F17210 Nicotine dependence, cigarettes, uncomplicated: Secondary | ICD-10-CM | POA: Diagnosis not present

## 2018-07-29 DIAGNOSIS — E11319 Type 2 diabetes mellitus with unspecified diabetic retinopathy without macular edema: Secondary | ICD-10-CM

## 2018-07-29 DIAGNOSIS — I2510 Atherosclerotic heart disease of native coronary artery without angina pectoris: Secondary | ICD-10-CM | POA: Insufficient documentation

## 2018-07-29 DIAGNOSIS — K219 Gastro-esophageal reflux disease without esophagitis: Secondary | ICD-10-CM | POA: Diagnosis not present

## 2018-07-29 DIAGNOSIS — I214 Non-ST elevation (NSTEMI) myocardial infarction: Secondary | ICD-10-CM

## 2018-07-29 DIAGNOSIS — Z89612 Acquired absence of left leg above knee: Secondary | ICD-10-CM

## 2018-07-29 DIAGNOSIS — I480 Paroxysmal atrial fibrillation: Secondary | ICD-10-CM | POA: Diagnosis not present

## 2018-07-29 DIAGNOSIS — R269 Unspecified abnormalities of gait and mobility: Secondary | ICD-10-CM | POA: Insufficient documentation

## 2018-07-29 DIAGNOSIS — I509 Heart failure, unspecified: Secondary | ICD-10-CM | POA: Insufficient documentation

## 2018-07-29 DIAGNOSIS — I5032 Chronic diastolic (congestive) heart failure: Secondary | ICD-10-CM

## 2018-07-29 DIAGNOSIS — N189 Chronic kidney disease, unspecified: Secondary | ICD-10-CM | POA: Insufficient documentation

## 2018-07-29 DIAGNOSIS — I252 Old myocardial infarction: Secondary | ICD-10-CM | POA: Insufficient documentation

## 2018-07-29 DIAGNOSIS — E1142 Type 2 diabetes mellitus with diabetic polyneuropathy: Secondary | ICD-10-CM | POA: Diagnosis not present

## 2018-07-29 DIAGNOSIS — J45909 Unspecified asthma, uncomplicated: Secondary | ICD-10-CM | POA: Insufficient documentation

## 2018-07-29 DIAGNOSIS — G8929 Other chronic pain: Secondary | ICD-10-CM | POA: Insufficient documentation

## 2018-07-29 DIAGNOSIS — F329 Major depressive disorder, single episode, unspecified: Secondary | ICD-10-CM | POA: Diagnosis not present

## 2018-07-29 DIAGNOSIS — S78112A Complete traumatic amputation at level between left hip and knee, initial encounter: Secondary | ICD-10-CM

## 2018-07-29 NOTE — Progress Notes (Addendum)
Subjective:    Patient ID: [name], female    DOB: 04-13-1966, 52 y.o.   MRN: [id number]  HPI 52 year old right-handed female with history of atrial fibrillation, maintained on Xarelto, diabetes mellitus, peripheral vascular disease presents for follow up for left AKA.  Last clinic visit with NP on 06/16/18.  Notes reviewed.  She has not scheduled to see surgery yet.  Her vision is getting worse. Her ulcer resolved. CBGs have been relatively controlled. She saw Cardiology and PCP. She completed therapies, doing HEP. Denies falls.  Pain Inventory Average Pain 8 Pain Right Now 8 My pain is constant, burning, dull, stabbing, tingling and aching  In the last 24 hours, has pain interfered with the following? General activity 6 Relation with others 6 Enjoyment of life 8 What TIME of day is your pain at its worst? night Sleep (in general) Poor  Pain is worse with: sitting Pain improves with: medication Relief from Meds: 5  Mobility ability to climb steps?  no do you drive?  no needs help with transfers transfers alone Do you have any goals in this area?  yes  Function disabled: date disabled n/a  Neuro/Psych bowel control problems numbness tingling spasms confusion anxiety  Prior Studies Any changes since last visit?  no  Physicians involved in your care Any changes since last visit?  no   Family History  Problem Relation Age of Onset  . Coronary artery disease Mother   . Peripheral vascular disease Mother   . Heart disease Mother        Before age 70  . Other Mother        Venous insuffiency  . Diabetes Mother   . Hyperlipidemia Mother   . Hypertension Mother   . Varicose Veins Mother   . Heart attack Mother        before age 72  . Heart disease Father   . Diabetes Father   . Diabetes Maternal Grandmother   . Diabetes Paternal Grandmother   . Diabetes Paternal Grandfather   . Diabetes Sister   . Hypertension Sister   . Diabetes Brother   .  Hypertension Brother    Social History   Socioeconomic History  . Marital status: Married    Spouse name: Not on file  . Number of children: Not on file  . Years of education: Not on file  . Highest education level: Not on file  Occupational History  . Occupation: disabled  Social Needs  . Financial resource strain: Not on file  . Food insecurity:    Worry: Not on file    Inability: Not on file  . Transportation needs:    Medical: Not on file    Non-medical: Not on file  Tobacco Use  . Smoking status: Current Some Day Smoker    Years: 30.00    Types: Cigarettes    Last attempt to quit: 02/07/2017    Years since quitting: 1.4  . Smokeless tobacco: Never Used  Substance and Sexual Activity  . Alcohol use: No    Alcohol/week: 0.0 standard drinks  . Drug use: No  . Sexual activity: Yes    Birth control/protection: Post-menopausal  Lifestyle  . Physical activity:    Days per week: Not on file    Minutes per session: Not on file  . Stress: Not on file  Relationships  . Social connections:    Talks on phone: Not on file    Gets together: Not on file  Attends religious service: Not on file    Active member of club or organization: Not on file    Attends meetings of clubs or organizations: Not on file    Relationship status: Not on file  Other Topics Concern  . Not on file  Social History Narrative  . Not on file   Past Surgical History:  Procedure Laterality Date  . ABDOMINAL AORTAGRAM  June 15, 2014  . ABDOMINAL AORTAGRAM N/A 06/15/2014   Procedure: ABDOMINAL Maxcine Ham;  Surgeon: Serafina Mitchell, MD;  Location: [facility] CATH LAB;  Service: Cardiovascular;  Laterality: N/A;  . ABDOMINAL AORTAGRAM N/A 11/22/2014   Procedure: ABDOMINAL AORTAGRAM;  Surgeon: Serafina Mitchell, MD;  Location: [facility] CATH LAB;  Service: Cardiovascular;  Laterality: N/A;  . ABDOMINAL AORTOGRAM W/LOWER EXTREMITY N/A 01/07/2017   Procedure: Abdominal Aortogram w/Lower Extremity;  Surgeon: Serafina Mitchell,  MD;  Location: [place] CV LAB;  Service: Cardiovascular;  Laterality: N/A;  . ABDOMINAL AORTOGRAM W/LOWER EXTREMITY N/A 10/31/2017   Procedure: ABDOMINAL AORTOGRAM W/LOWER EXTREMITY;  Surgeon: Elam Dutch, MD;  Location: [place] CV LAB;  Service: Cardiovascular;  Laterality: N/A;  . ABDOMINAL AORTOGRAM W/LOWER EXTREMITY N/A 03/24/2018   Procedure: ABDOMINAL AORTOGRAM W/LOWER EXTREMITY;  Surgeon: Serafina Mitchell, MD;  Location: [place] CV LAB;  Service: Cardiovascular;  Laterality: N/A;  . AMPUTATION Left 04/26/2018   Procedure: AMPUTATION ABOVE KNEE;  Surgeon: Elam Dutch, MD;  Location: [place];  Service: Vascular;  Laterality: Left;  . AORTA - BILATERAL FEMORAL ARTERY BYPASS GRAFT N/A 11/28/2017   Procedure: AORTA BIFEMORAL BYPASS USING HEMASHIELD GOLD GRAFT & REIMPLANT IMA;  Surgeon: Serafina Mitchell, MD;  Location[facility];  Service: Vascular;  Laterality: N/A;  . AORTIC ARCH ANGIOGRAPHY N/A 10/31/2017   Procedure: AORTIC ARCH ANGIOGRAPHY;  Surgeon: Elam Dutch, MD;  Location: [place] CV LAB;  Service: Cardiovascular;  Laterality: N/A;  . APPLICATION OF WOUND VAC  11/28/2017   Procedure: APPLICATION OF WOUND VAC;  Surgeon: Serafina Mitchell, MD;  Location: [facility] OR;  Service: Vascular;;  . APPLICATION OF WOUND VAC Left 03/27/2018   Procedure: APPLICATION OF WOUND VAC LEFT GROIN USING PREVENA PLUS;  Surgeon: Serafina Mitchell, MD;  Location: [place];  Service: Vascular;  Laterality: Left;  . ARTERIAL BYPASS SURGERY   07/05/2010   Right Common Femoral to below knee popliteal BPG  . BACK SURGERY     X's  2  . CARDIAC CATHETERIZATION    . CHOLECYSTECTOMY     Gall Bladder  . CYSTECTOMY Left    wrist  . EMBOLECTOMY Left 11/28/2017   Procedure: Left Lower Extremity Embolectomy, Left Tibial Peroneal Trunk Endarterectomy with Patch Angioplasty; Vein Harvest Small Saphenous Graft Left Lower Leg;  Surgeon: [name], MD;  Location: [place];  Service: Vascular;   Laterality: Left;  . FEMORAL-POPLITEAL BYPASS GRAFT Left 03/27/2018   Procedure: THROMBECTOMY OF LEFT FEMORAL TIBIAL BYPASS;  Surgeon: Serafina Mitchell, MD;  Location: [place];  Service: Vascular;  Laterality: Left;  . FEMORAL-TIBIAL BYPASS GRAFT Left 03/27/2018   Procedure: BYPASS GRAFT FEMORAL-TIBIAL ARTERY LEFT REDO USING CRYOPRESERVED SAPHENOUS VEIN 70cm;  Surgeon: Serafina Mitchell, MD;  Location: [facility] OR;  Service: Vascular;  Laterality: Left;  . INTERCOSTAL NERVE BLOCK  November 2015  . INTRAOPERATIVE ARTERIOGRAM  11/28/2017   Procedure: INTRA OPERATIVE ARTERIOGRAM OF LEFT LEG;  Surgeon: Serafina Mitchell, MD;  Location: [place];  Service: Vascular;;  . INTRAOPERATIVE ARTERIOGRAM Left 03/27/2018  Procedure: INTRA OPERATIVE ARTERIOGRAM TIMES TWO;  Surgeon: Serafina Mitchell, MD;  Location: [place];  Service: Vascular;  Laterality: Left;  . IR FLUORO GUIDE CV LINE RIGHT  03/20/2017  . IR FLUORO GUIDE CV LINE RIGHT  05/05/2018  . IR REMOVAL TUN CV CATH W/O FL  05/20/2018  . IR US GUIDE VASC ACCESS RIGHT  03/20/2017  . IR US GUIDE VASC ACCESS RIGHT  05/05/2018  . IRRIGATION AND DEBRIDEMENT BUTTOCKS Right 09/30/2016   Procedure: DEBRIDEMENT RIGHT  BUTTOCK WOUND;  Surgeon: Georganna Skeans, MD;  Location: [place];  Service: General;  Laterality: Right;  . left foot surgery    . PERIPHERAL VASCULAR CATHETERIZATION N/A 05/07/2016   Procedure: Abdominal Aortogram;  Surgeon: Serafina Mitchell, MD;  Location: [place] CV LAB;  Service: Cardiovascular;  Laterality: N/A;  . PERIPHERAL VASCULAR CATHETERIZATION N/A 05/07/2016   Procedure: Lower Extremity Angiography;  Surgeon: Serafina Mitchell, MD;  Location: [place] CV LAB;  Service: Cardiovascular;  Laterality: N/A;  . PERIPHERAL VASCULAR CATHETERIZATION N/A 05/07/2016   Procedure: Aortic Arch Angiography;  Surgeon: Serafina Mitchell, MD;  Location: [place] CV LAB;  Service: Cardiovascular;  Laterality: N/A;  . PERIPHERAL VASCULAR CATHETERIZATION N/A 05/07/2016    Procedure: Upper Extremity Angiography;  Surgeon: Serafina Mitchell, MD;  Location: [place] CV LAB;  Service: Cardiovascular;  Laterality: N/A;  . PERIPHERAL VASCULAR CATHETERIZATION Right 05/07/2016   Procedure: Peripheral Vascular Balloon Angioplasty;  Surgeon: Serafina Mitchell, MD;  Location: [place] CV LAB;  Service: Cardiovascular;  Laterality: Right;  subclavian  . PERIPHERAL VASCULAR CATHETERIZATION Right 05/07/2016   Procedure: Peripheral Vascular Intervention;  Surgeon: Serafina Mitchell, MD;  Location: [place] CV LAB;  Service: Cardiovascular;  Laterality: Right;  External  Iliac  . SKIN GRAFT Right 2012   [name] by Dr. Nils Pyle- Right and Left Ankle  . SPINE SURGERY    . THROMBECTOMY FEMORAL ARTERY  11/28/2017   Procedure: THROMBECTOMY  & REVISION OF BILATERAL FEMORAL TO POPLETEAL ARTERIES;  Surgeon: Serafina Mitchell, MD;  Location: [facility] OR;  Service: Vascular;;  . TONSILLECTOMY     Past Medical History:  Diagnosis Date  . Abnormal stress test    a. 02/2017 MV: large region of fixed perfusion defect in basal to mid inf, mid-dist inflat walls, EF 43%. No ischemia (EF 55-60% by f/u echo).  . Arthritis   . Asthma   . Carotid arterial disease ([place])    a. 09/2017 Carotid U/S: 40-49% bilat ICA stenosis.  . Chronic back pain   . Coronary artery calcification seen on CT scan    a. 11/2017 CT Abd/Pelvis: Multi vessel coronary vascular Ca2+.  . Depression   . Diabetes mellitus   . Diabetic neuropathy ([place])   . Difficult intubation    DIFFICULT AIRWAY/FYI  . Family history of adverse reaction to anesthesia    mother had difficlty waking   . Femoral-popliteal bypass graft occlusion, left ([place]) 12/02/2017  . GERD (gastroesophageal reflux disease)   . History of echocardiogram    a. 03/2017 Echo: EF 55-60%, mild LVH, nl RV fxn.  . Hyperlipidemia   . Ischemic cardiomyopathy    a. 04/2018 Echo: EF 30-35%, Gr2 DD, mild LVH. Mild MR. Mildly dil LA, mod dil RV w/ mod red RV fxn, mild TR, PASP  76mmHg.  . NSTEMI (non-ST elevated myocardial infarction) ([place])    a. 05/2018 in setting of Afib, sepsis, and post-op L AKA. Peak trop 7.7. EF 30-35% by echo-->cath  not performed 2/2 renal failure.  . 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 PAD (peripheral artery disease) ([place])    a. S/p L fem-pop bypass; b. 11/2017 s/p Aortobifem bypass 2/2 graft occlusion; c. 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  . Paroxysmal atrial fibrillation with rapid ventricular response ([place]) 12/02/2017   a. CHA2DS2VASc = 3-->Xarelto; b. 05/2018 Recurrent Afib-->amio.  Marland Kitchen Ulcer    Foot   BP (!) 87/55   Pulse 66   Ht 5\' 7"  (1.702 m)   Wt 150 lb (68 kg)   SpO2 96%   BMI 23.49 kg/m   Opioid Risk Score:   Fall Risk Score:  `1  Depression screen PHQ 2/9  Depression screen [facility] 2/9 07/29/2018 01/26/2018 11/19/2017 09/04/2017 08/12/2017 05/12/2017 07/18/2016  Decreased Interest 0 0 0 0 0 0 0  Down, Depressed, Hopeless 0 0 0 0 0 0 0  PHQ - 2 Score 0 0 0 0 0 0 0  Altered sleeping - - - - - - -  Tired, decreased energy - - - - - - -  Change in appetite - - - - - - -  Feeling bad or failure about yourself  - - - - - - -  Trouble concentrating - - - - - - -  Moving slowly or fidgety/restless - - - - - - -  Suicidal thoughts - - - - - - -  PHQ-9 Score - - - - - - -  Difficult doing work/chores - - - - - - -  Some recent data might be hidden    Review of Systems  Constitutional: Positive for diaphoresis.  HENT: Negative.   Eyes: Positive for visual disturbance.  Respiratory: Negative.   Cardiovascular: Negative.   Gastrointestinal: Positive for diarrhea and nausea.  Endocrine: Negative.   Genitourinary: Negative.   Musculoskeletal: Positive for arthralgias, back pain, gait problem and myalgias.  Skin: Negative.   Allergic/Immunologic: Negative.   Hematological: Negative.   Psychiatric/Behavioral: Negative.   All other systems reviewed and are negative.       Objective:   Physical Exam Constitutional: No distress . Vital signs reviewed. HENT: Normocephalic.  Atraumatic. Eyes: Visual disturbance. No discharge. Cardiovascular: RRR. No JVD. Respiratory: CTA bilaterally. Normal effort. GI: BS +. Non-distended. Musculoskeletal: Left AKA Neurological: She is alert and oriented.  Motor: B/l UE 4+/5 proximal to distal LLE: HF 4+/5  [name]: HF 4+/5, KE 4+/5, ADF 4+/5  Skin: Amputation site with incision C/D/I Psychiatric: Less anxious, more appropriate today    Assessment & Plan:  52 year old right-handed female with history of atrial fibrillation, maintained on Xarelto, diabetes mellitus, peripheral vascular disease presents for follow up for left AKA.  1. Decreased functional mobility secondary to left AKA 03/25/997 complicated by septic shock and AKI             Cont HEP  Follow up with Surgery  Will order shrinker for [employer]  2.  NSTEMI/atrial fibrillation with RVR.    Follow up with Cards  3.  Diastolic congestive heart failure.    Cont to follow weights  4.  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    Cont meds  Relatively controlled per pt  Vision worsening per pt, encouraged follow up with Ophth  5. Gait abnormality  Cont wheelchair

## 2018-07-29 NOTE — Addendum Note (Signed)
Addended by: Delice Lesch A on: 07/29/2018 01:06 PM   Modules accepted: Level of Service

## 2018-08-04 ENCOUNTER — Encounter

## 2018-08-17 ENCOUNTER — Other Ambulatory Visit: Payer: Self-pay | Admitting: Surgery

## 2018-08-17 ENCOUNTER — Other Ambulatory Visit: Payer: Self-pay | Admitting: Physical Medicine and Rehabilitation

## 2018-08-28 ENCOUNTER — Ambulatory Visit: Payer: Medicare Other | Admitting: Internal Medicine

## 2018-09-08 ENCOUNTER — Telehealth: Payer: Self-pay | Admitting: Physical Medicine & Rehabilitation

## 2018-09-08 NOTE — Telephone Encounter (Signed)
[name] with [employer] called and needs an order for Physical Therapy for the patient.  Please send to her at [phone].

## 2018-09-08 NOTE — Telephone Encounter (Signed)
We can make her a referral. Thanks.

## 2018-09-09 NOTE — Telephone Encounter (Signed)
Will do. Thanks.

## 2018-09-09 NOTE — Telephone Encounter (Signed)
Spoke with Juliann Pulse at [employer]. We will need to fill out referral on one of our RX pads and have Dr. Posey Pronto sign it. Will Fax to [phone].  Needs to say  - 'physical therapy for gait training'

## 2018-09-10 NOTE — Telephone Encounter (Signed)
rx signed and faxed to [employer]

## 2018-09-18 IMAGING — CR DG CHEST 2V
2 series · 2 of 2 positions shown · non-contrast
Comparison: 10/01/2016

CLINICAL DATA: Low blood pressure wound on buttock

EXAM:
CHEST  2 VIEW

[chest pa]
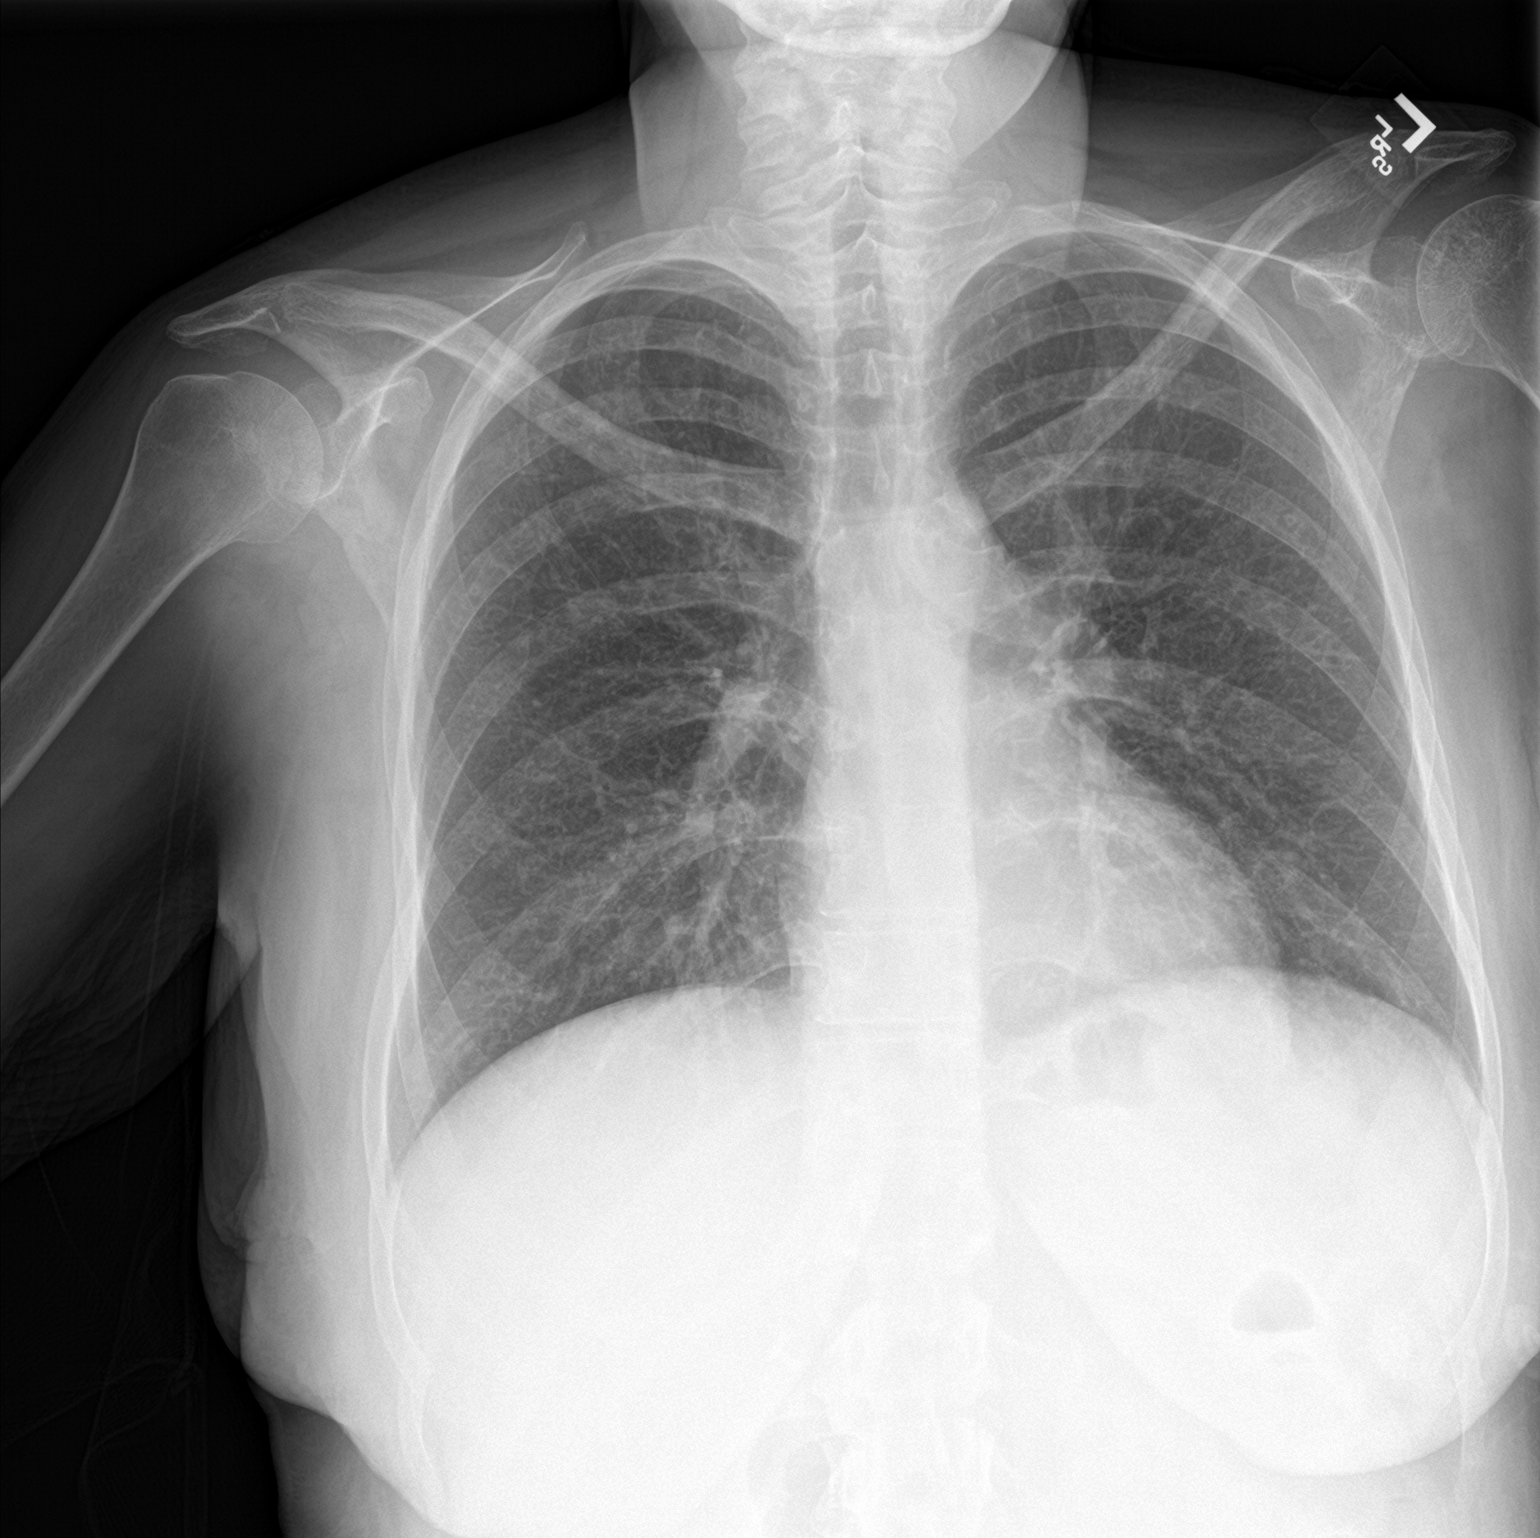

[chest lat]
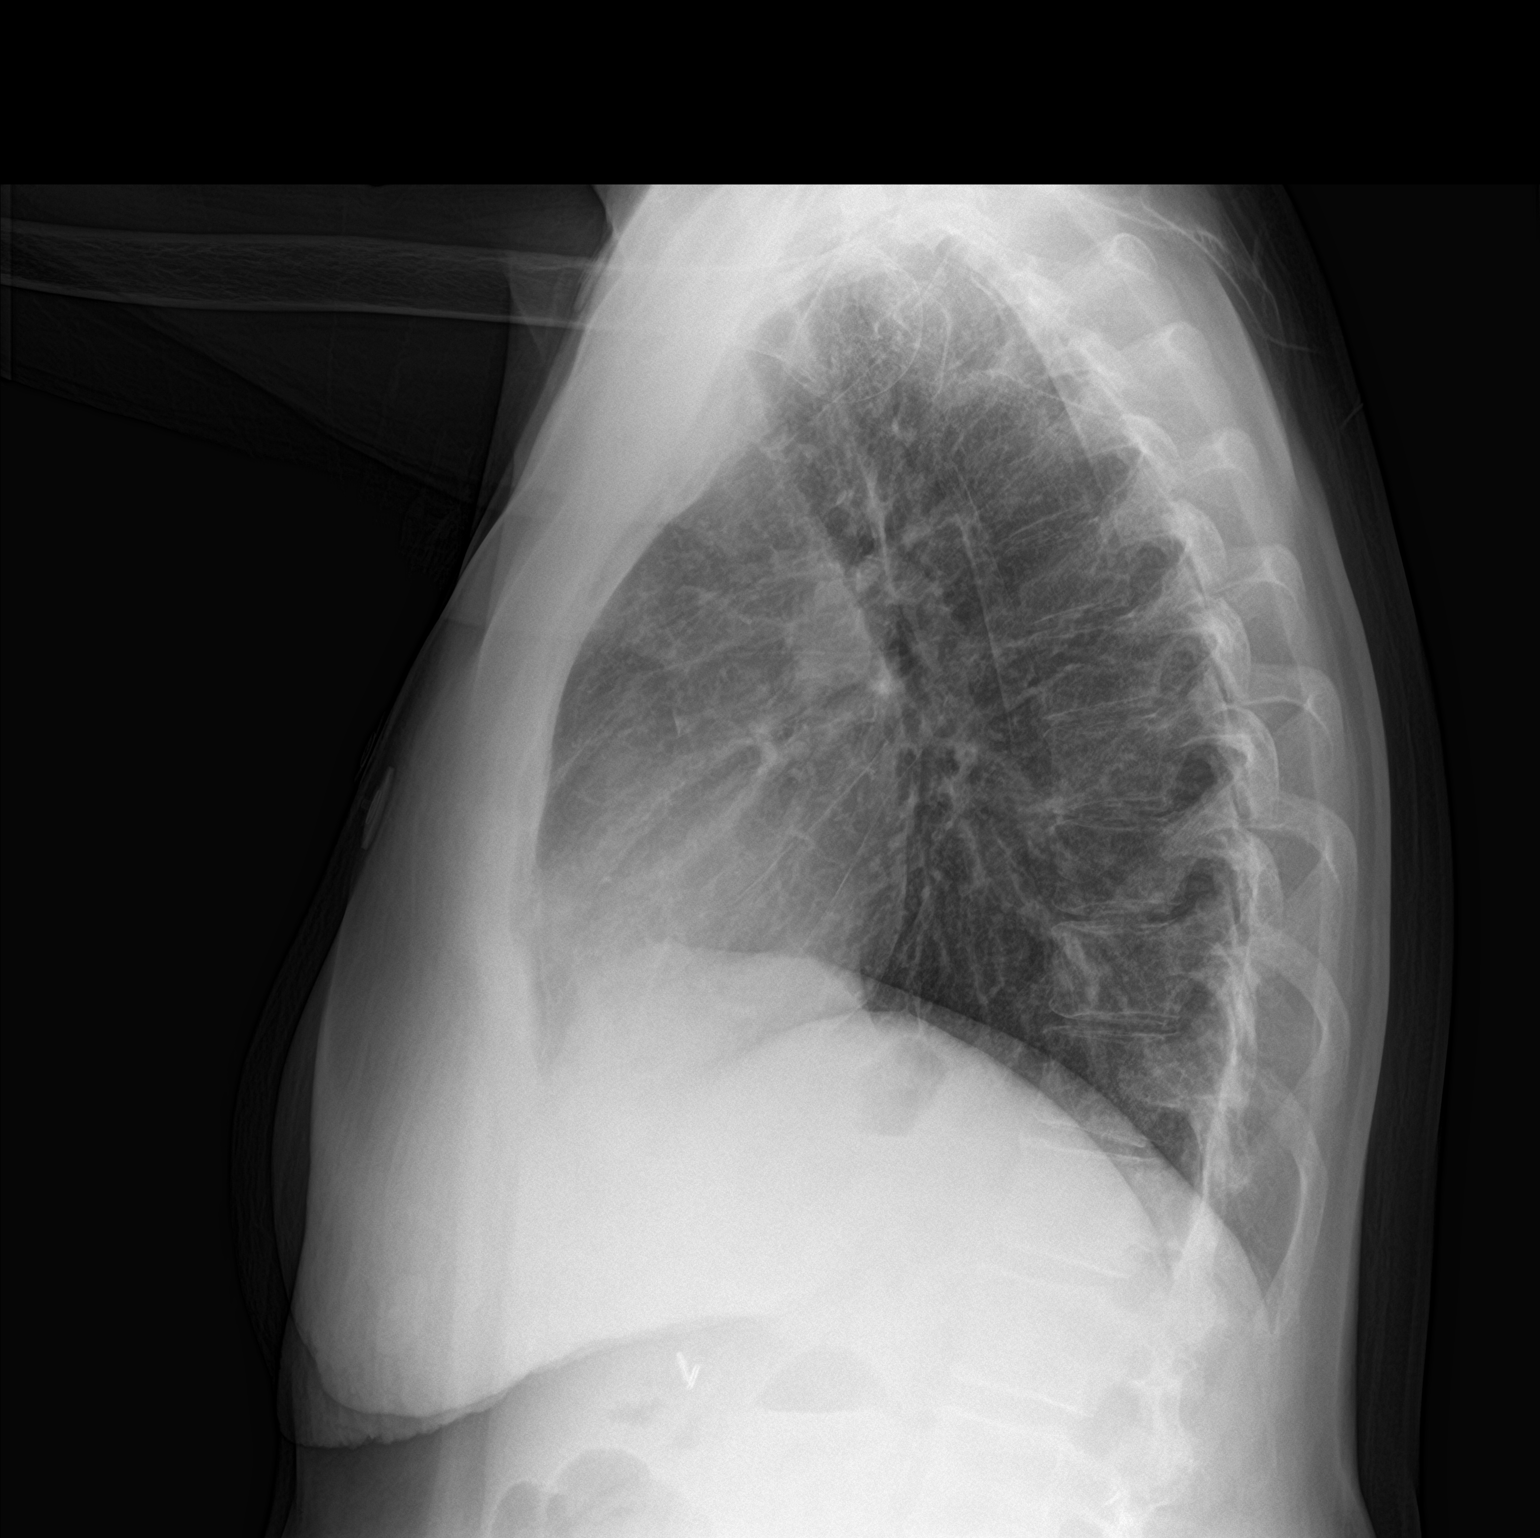

[2 of 2 positions shown; findings below may reference images not displayed]

FINDINGS: The heart size and mediastinal contours are within normal limits.
Both lungs are clear. The visualized skeletal structures are
unremarkable. Surgical clips in the right upper quadrant
IMPRESSION: No active cardiopulmonary disease.

## 2018-09-28 ENCOUNTER — Encounter: Payer: Medicare Other | Admitting: Registered Nurse

## 2018-10-02 ENCOUNTER — Ambulatory Visit: Payer: Medicare Other | Admitting: Internal Medicine

## 2018-11-05 ENCOUNTER — Telehealth: Payer: Self-pay

## 2018-11-05 NOTE — Telephone Encounter (Signed)
Spoke with patient. Patient stated she would go sometime between 11/12/2018 and 11/17/2018 to get this blood work done.    ===View-only below this line=== ----- Message ----- From: Vanessa Ralphs, RN Sent: 11/05/2018   7:49 AM EST To: Janan Ridge, CMA  Yes, if you can contact her and ask her to do so that would be great. If any questions or concerns arise, please let me know.  Thanks, Anderson Malta, RN :)   ----- Message ----- From: Janan Ridge, CMA Sent: 11/04/2018   8:55 AM EST To: Vanessa Ralphs, RN  Just wanted to make you aware patient has a follow up appointment 12/17/2018.  At last office visit patient [facility].   Do I need to try to contact patient to see if she can get this done before her appointment with Dr. Saunders Revel on 11/20/2018.  Please let me know!   Thanks !

## 2018-11-16 ENCOUNTER — Other Ambulatory Visit
Admission: RE | Admit: 2018-11-16 | Discharge: 2018-11-16 | Disposition: A | Discharge status: Home / Self Care | Payer: Medicare Other | Attending: Nurse Practitioner | Admitting: Nurse Practitioner

## 2018-11-16 DIAGNOSIS — I5032 Chronic diastolic (congestive) heart failure: Secondary | ICD-10-CM | POA: Insufficient documentation

## 2018-11-16 DIAGNOSIS — I4891 Unspecified atrial fibrillation: Secondary | ICD-10-CM | POA: Diagnosis present

## 2018-11-16 LAB — CBC WITH DIFFERENTIAL/PLATELET
ABS IMMATURE GRANULOCYTES: 0.04 10*3/uL (ref 0.00–0.07)
BASOS PCT: 1 %
Basophils Absolute: 0.1 10*3/uL (ref 0.0–0.1)
Eosinophils Absolute: 0.2 10*3/uL (ref 0.0–0.5)
Eosinophils Relative: 2 %
HCT: 44.6 % (ref 36.0–46.0)
Hemoglobin: 13.7 g/dL (ref 12.0–15.0)
IMMATURE GRANULOCYTES: 1 %
Lymphocytes Relative: 26 %
Lymphs Abs: 1.8 10*3/uL (ref 0.7–4.0)
MCH: 25.8 pg — ABNORMAL LOW (ref 26.0–34.0)
MCHC: 30.7 g/dL (ref 30.0–36.0)
MCV: 84.2 fL (ref 80.0–100.0)
MONOS PCT: 6 %
Monocytes Absolute: 0.4 10*3/uL (ref 0.1–1.0)
NEUTROS ABS: 4.5 10*3/uL (ref 1.7–7.7)
NEUTROS PCT: 64 %
PLATELETS: 218 10*3/uL (ref 150–400)
RBC: 5.3 MIL/uL — ABNORMAL HIGH (ref 3.87–5.11)
RDW: 15.5 % (ref 11.5–15.5)
WBC: 6.9 10*3/uL (ref 4.0–10.5)
nRBC: 0 % (ref 0.0–0.2)

## 2018-11-16 LAB — BASIC METABOLIC PANEL
ANION GAP: 8 (ref 5–15)
BUN: 27 mg/dL — ABNORMAL HIGH (ref 6–20)
CO2: 28 mmol/L (ref 22–32)
Calcium: 8.9 mg/dL (ref 8.9–10.3)
Chloride: 100 mmol/L (ref 98–111)
Creatinine, Ser: 1.26 mg/dL — ABNORMAL HIGH (ref 0.44–1.00)
GFR calc Af Amer: 56 mL/min — ABNORMAL LOW (ref >60)
GFR, EST NON AFRICAN AMERICAN: 48 mL/min — AB (ref >60)
GLUCOSE: 177 mg/dL — AB (ref 70–99)
POTASSIUM: 4.9 mmol/L (ref 3.5–5.1)
SODIUM: 136 mmol/L (ref 135–145)

## 2018-11-19 NOTE — Progress Notes (Signed)
Follow-up Outpatient Visit Date: 11/20/2018  Primary Care Provider: Frazier Richards, [place] [address]  Chief Complaint: Follow-up coronary artery disease, cardiomyopathy, and atrial fibrillation  HPI:  [name] is a 53 y.o. year-old female with history of presumed CAD, PAD, PAF, and presumed ischemic cardiomyopathy, who presents for follow-up of CAD, CM, and a-fib.  She was last seen in our office in September after complex hospitalization in the spring for limb ischemia ultimately leading to left AKA complicated by NSTEMI, newly reduced LVEF, and atrial fibrillation.  Catheterization was not pursued due to acute kidney injury requiring temporary hemodialysis.  Cardiac catheterization was discussed in September, as her renal function had improved.  However, [name] wished to defer at that time in order to recover further from her hospitalization.  She cancelled follow-up appointments with me in October and November.  Today, [name] reports that she continues to improve.  She was recently fitted for a prosthesis for her left AKA and is beginning to do some physical therapy with this.  She denies chest pain, shortness of breath, palpitations, lightheadedness, and edema.  She remains on rivaroxaban without significant bleeding.  She has a small scab on her low back at the site of previous sacral wound.  This is very sensitive to touch.  She no longer follows routinely with the wound care clinic.  --------------------------------------------------------------------------------------------------  Past Medical History:  Diagnosis Date  . Abnormal stress test    a. 02/2017 MV: large region of fixed perfusion defect in basal to mid inf, mid-dist inflat walls, EF 43%. No ischemia (EF 55-60% by f/u echo).  . Arthritis   . Asthma   . Carotid arterial disease ([place])    a. 09/2017 Carotid U/S: 40-49% bilat ICA stenosis.  . Chronic back pain   . Coronary artery calcification  seen on CT scan    a. 11/2017 CT Abd/Pelvis: Multi vessel coronary vascular Ca2+.  . Depression   . Diabetes mellitus   . Diabetic neuropathy ([place])   . Difficult intubation    DIFFICULT AIRWAY/FYI  . Family history of adverse reaction to anesthesia    mother had difficlty waking   . Femoral-popliteal bypass graft occlusion, left ([place]) 12/02/2017  . GERD (gastroesophageal reflux disease)   . History of echocardiogram    a. 03/2017 Echo: EF 55-60%, mild LVH, nl RV fxn.  . Hyperlipidemia   . Ischemic cardiomyopathy    a. 04/2018 Echo: EF 30-35%, Gr2 DD, mild LVH. Mild MR. Mildly dil LA, mod dil RV w/ mod red RV fxn, mild TR, PASP 2mmHg.  . NSTEMI (non-ST elevated myocardial infarction) ([place])    a. 05/2018 in setting of Afib, sepsis, and post-op L AKA. Peak trop 7.7. EF 30-35% by echo-->cath not performed 2/2 renal failure.  . 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 PAD (peripheral artery disease) ([place])    a. S/p L fem-pop bypass; b. 11/2017 s/p Aortobifem bypass 2/2 graft occlusion; c. 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  . Paroxysmal atrial fibrillation with rapid ventricular response ([place]) 12/02/2017   a. CHA2DS2VASc = 3-->Xarelto; b. 05/2018 Recurrent Afib-->amio.  Marland Kitchen Ulcer    Foot   Past Surgical History:  Procedure Laterality Date  . ABDOMINAL AORTAGRAM  June 15, 2014  . ABDOMINAL AORTAGRAM N/A 06/15/2014   Procedure: ABDOMINAL Maxcine Ham;  Surgeon: Serafina Mitchell, MD;  Location: [facility] CATH LAB;  Service: Cardiovascular;  Laterality: N/A;  . ABDOMINAL  AORTAGRAM N/A 11/22/2014   Procedure: ABDOMINAL Maxcine Ham;  Surgeon: Serafina Mitchell, MD;  Location: [facility] CATH LAB;  Service: Cardiovascular;  Laterality: N/A;  . ABDOMINAL AORTOGRAM W/LOWER EXTREMITY N/A 01/07/2017   Procedure: Abdominal Aortogram w/Lower Extremity;  Surgeon: Serafina Mitchell, MD;  Location: [place] CV LAB;  Service: Cardiovascular;  Laterality: N/A;  . ABDOMINAL AORTOGRAM W/LOWER  EXTREMITY N/A 10/31/2017   Procedure: ABDOMINAL AORTOGRAM W/LOWER EXTREMITY;  Surgeon: Elam Dutch, MD;  Location: [place] CV LAB;  Service: Cardiovascular;  Laterality: N/A;  . ABDOMINAL AORTOGRAM W/LOWER EXTREMITY N/A 03/24/2018   Procedure: ABDOMINAL AORTOGRAM W/LOWER EXTREMITY;  Surgeon: Serafina Mitchell, MD;  Location: [place] CV LAB;  Service: Cardiovascular;  Laterality: N/A;  . AMPUTATION Left 04/26/2018   Procedure: AMPUTATION ABOVE KNEE;  Surgeon: Elam Dutch, MD;  Location: [place];  Service: Vascular;  Laterality: Left;  . AORTA - BILATERAL FEMORAL ARTERY BYPASS GRAFT N/A 11/28/2017   Procedure: AORTA BIFEMORAL BYPASS USING HEMASHIELD GOLD GRAFT & REIMPLANT IMA;  Surgeon: Serafina Mitchell, MD;  Location: [facility] OR;  Service: Vascular;  Laterality: N/A;  . AORTIC ARCH ANGIOGRAPHY N/A 10/31/2017   Procedure: AORTIC ARCH ANGIOGRAPHY;  Surgeon: Elam Dutch, MD;  Location: [place] CV LAB;  Service: Cardiovascular;  Laterality: N/A;  . APPLICATION OF WOUND VAC  11/28/2017   Procedure: APPLICATION OF WOUND VAC;  Surgeon: Serafina Mitchell, MD;  Location: [facility] OR;  Service: Vascular;;  . APPLICATION OF WOUND VAC Left 03/27/2018   Procedure: APPLICATION OF WOUND VAC LEFT GROIN USING PREVENA PLUS;  Surgeon: Serafina Mitchell, MD;  Location: [place];  Service: Vascular;  Laterality: Left;  . ARTERIAL BYPASS SURGERY   07/05/2010   Right Common Femoral to below knee popliteal BPG  . BACK SURGERY     X's  2  . CARDIAC CATHETERIZATION    . CHOLECYSTECTOMY     Gall Bladder  . CYSTECTOMY Left    wrist  . EMBOLECTOMY Left 11/28/2017   Procedure: Left Lower Extremity Embolectomy, Left Tibial Peroneal Trunk Endarterectomy with Patch Angioplasty; Vein Harvest Small Saphenous Graft Left Lower Leg;  Surgeon: [name], MD;  Location: [place];  Service: Vascular;  Laterality: Left;  . FEMORAL-POPLITEAL BYPASS GRAFT Left 03/27/2018   Procedure: THROMBECTOMY OF LEFT FEMORAL TIBIAL  BYPASS;  Surgeon: Serafina Mitchell, MD;  Location: [place];  Service: Vascular;  Laterality: Left;  . FEMORAL-TIBIAL BYPASS GRAFT Left 03/27/2018   Procedure: BYPASS GRAFT FEMORAL-TIBIAL ARTERY LEFT REDO USING CRYOPRESERVED SAPHENOUS VEIN 70cm;  Surgeon: Serafina Mitchell, MD;  Location: [facility] OR;  Service: Vascular;  Laterality: Left;  . INTERCOSTAL NERVE BLOCK  November 2015  . INTRAOPERATIVE ARTERIOGRAM  11/28/2017   Procedure: INTRA OPERATIVE ARTERIOGRAM OF LEFT LEG;  Surgeon: Serafina Mitchell, MD;  Location: [place];  Service: Vascular;;  . INTRAOPERATIVE ARTERIOGRAM Left 03/27/2018   Procedure: INTRA OPERATIVE ARTERIOGRAM TIMES TWO;  Surgeon: Serafina Mitchell, MD;  Location: [place];  Service: Vascular;  Laterality: Left;  . IR FLUORO GUIDE CV LINE RIGHT  03/20/2017  . IR FLUORO GUIDE CV LINE RIGHT  05/05/2018  . IR REMOVAL TUN CV CATH W/O FL  05/20/2018  . IR US GUIDE VASC ACCESS RIGHT  03/20/2017  . IR US GUIDE VASC ACCESS RIGHT  05/05/2018  . IRRIGATION AND DEBRIDEMENT BUTTOCKS Right 09/30/2016   Procedure: DEBRIDEMENT RIGHT  BUTTOCK WOUND;  Surgeon: Georganna Skeans, MD;  Location: [place];  Service: General;  Laterality:  Right;  . left foot surgery    . PERIPHERAL VASCULAR CATHETERIZATION N/A 05/07/2016   Procedure: Abdominal Aortogram;  Surgeon: Serafina Mitchell, MD;  Location: [place] CV LAB;  Service: Cardiovascular;  Laterality: N/A;  . PERIPHERAL VASCULAR CATHETERIZATION N/A 05/07/2016   Procedure: Lower Extremity Angiography;  Surgeon: Serafina Mitchell, MD;  Location: [place] CV LAB;  Service: Cardiovascular;  Laterality: N/A;  . PERIPHERAL VASCULAR CATHETERIZATION N/A 05/07/2016   Procedure: Aortic Arch Angiography;  Surgeon: Serafina Mitchell, MD;  Location: [place] CV LAB;  Service: Cardiovascular;  Laterality: N/A;  . PERIPHERAL VASCULAR CATHETERIZATION N/A 05/07/2016   Procedure: Upper Extremity Angiography;  Surgeon: Serafina Mitchell, MD;  Location: [place] CV LAB;  Service:  Cardiovascular;  Laterality: N/A;  . PERIPHERAL VASCULAR CATHETERIZATION Right 05/07/2016   Procedure: Peripheral Vascular Balloon Angioplasty;  Surgeon: Serafina Mitchell, MD;  Location: [place] CV LAB;  Service: Cardiovascular;  Laterality: Right;  subclavian  . PERIPHERAL VASCULAR CATHETERIZATION Right 05/07/2016   Procedure: Peripheral Vascular Intervention;  Surgeon: Serafina Mitchell, MD;  Location: [place] CV LAB;  Service: Cardiovascular;  Laterality: Right;  External  Iliac  . SKIN GRAFT Right 2012   [name] by Dr. Nils Pyle- Right and Left Ankle  . SPINE SURGERY    . THROMBECTOMY FEMORAL ARTERY  11/28/2017   Procedure: THROMBECTOMY  & REVISION OF BILATERAL FEMORAL TO POPLETEAL ARTERIES;  Surgeon: Serafina Mitchell, MD;  Location: [facility] OR;  Service: Vascular;;  . TONSILLECTOMY      Current Meds  Medication Sig  . albuterol (PROVENTIL) 2 MG tablet Take 2 mg by mouth 3 (three) times daily as needed for shortness of breath.   Marland Kitchen amitriptyline (ELAVIL) 25 MG tablet TAKE 1 TABLET BY MOUTH AT BEDTIME FOR LEG PAIN  . cetirizine (ZYRTEC) 10 MG tablet Take 10 mg by mouth daily.   . diphenoxylate-atropine (LOMOTIL) 2.5-0.025 MG tablet Take 1 tablet by mouth 4 (four) times daily as needed for diarrhea or loose stools.  . ferrous sulfate 325 (65 FE) MG tablet Take 1 tablet (325 mg total) by mouth 2 (two) times daily with a meal.  . furosemide (LASIX) 80 MG tablet Take 1 tablet (80 mg total) by mouth daily.  Marland Kitchen HYDROcodone-acetaminophen (NORCO) 10-325 MG tablet LIMIT 1 2 TO 1 (ONE HALF TO ONE) TABLET BY MOUTH PER DAY OR 2 4 TIMES DAILY IF TOLERATED. NO OXYCODONE.  Marland Kitchen insulin glargine (LANTUS) 100 UNIT/ML injection Inject 0.18 mLs (18 Units total) into the skin 2 (two) times daily.  . insulin lispro (HUMALOG) 100 UNIT/ML injection For glucose 121-150 use TWO units,  for 151-200 use FOUR units, for 201-250 use SIX units, for 251-300 use EIGHT units, for 301-350 use TEN units. For blood sugars greater than 350  call MD.  . loperamide (IMODIUM A-D) 2 MG tablet Take 12 mg by mouth daily as needed for diarrhea or loose stools.  . metoprolol tartrate (LOPRESSOR) 25 MG tablet Take 0.5 tablets (12.5 mg total) by mouth 2 (two) times daily.  . nitroGLYCERIN (NITROSTAT) 0.4 MG SL tablet Place 1 tablet (0.4 mg total) under the tongue every 5 (five) minutes as needed for chest pain.  Marland Kitchen omeprazole (PRILOSEC) 20 MG capsule Take 20 mg by mouth daily.  . ondansetron (ZOFRAN-ODT) 4 MG disintegrating tablet DISSOLVE 1 TABLET IN MOUTH EVERY 8 HOURS AS NEEDED FOR NAUSEA OR VOMITING  . ondansetron (ZOFRAN-ODT) 4 MG disintegrating tablet DISSOLVE 1 TABLET IN MOUTH EVERY 8 HOURS AS  NEEDED FOR NAUSEA OR VOMITING  . pregabalin (LYRICA) 50 MG capsule Take 1 capsule (50 mg total) by mouth 2 (two) times daily.  . Rivaroxaban (XARELTO) 15 MG TABS tablet Take 1 tablet (15 mg total) by mouth daily with supper.  . rosuvastatin (CRESTOR) 20 MG tablet Take 1 tablet (20 mg total) by mouth daily at 6 PM.  . [DISCONTINUED] amiodarone (PACERONE) 200 MG tablet Take 1 tablet (200 mg total) by mouth daily.    Allergies: Lactose intolerance (gi)  Social History   Tobacco Use  . Smoking status: Current Some Day Smoker    Years: 30.00    Types: Cigarettes    Last attempt to quit: 02/07/2017    Years since quitting: 1.7  . Smokeless tobacco: Never Used  Substance Use Topics  . Alcohol use: No    Alcohol/week: 0.0 standard drinks  . Drug use: No    Family History  Problem Relation Age of Onset  . Coronary artery disease Mother   . Peripheral vascular disease Mother   . Heart disease Mother        Before age 26  . Other Mother        Venous insuffiency  . Diabetes Mother   . Hyperlipidemia Mother   . Hypertension Mother   . Varicose Veins Mother   . Heart attack Mother        before age 42  . Heart disease Father   . Diabetes Father   . Diabetes Maternal Grandmother   . Diabetes Paternal Grandmother   . Diabetes Paternal  Grandfather   . Diabetes Sister   . Hypertension Sister   . Diabetes Brother   . Hypertension Brother     Review of Systems: A 12-system review of systems was performed and was negative except as noted in the HPI.  --------------------------------------------------------------------------------------------------  Physical Exam: BP 124/70 (BP Location: Right Arm, Patient Position: Sitting, Cuff Size: Normal)   Pulse 71   Ht 5\' 7"  (1.702 m)   Wt 157 lb (71.2 kg)   BMI 24.59 kg/m   General: NAD.  Accompanied by husband. HEENT: No conjunctival pallor or scleral icterus. Moist mucous membranes.  OP clear. Neck: Supple without lymphadenopathy, thyromegaly, JVD, or HJR. Lungs: Normal work of breathing. Clear to auscultation bilaterally without wheezes or crackles. Heart: Regular rate and rhythm without murmurs, rubs, or gallops.  Unable to assess PMI due to body habitus. Abd: Bowel sounds present. Soft, NT/ND without hepatosplenomegaly Ext: Status post left AKA.  Amputation stump is well-healed.  No edema in right lower extremity.  Radial pulses not palpable in either arm. Skin: Warm and dry without rash.  EKG: Normal sinus rhythm with low voltage and inferior Q waves.  No significant change from prior tracing on 07/21/2018.  Lab Results  Component Value Date   WBC 6.9 11/16/2018   HGB 13.7 11/16/2018   HCT 44.6 11/16/2018   MCV 84.2 11/16/2018   PLT 218 11/16/2018    Lab Results  Component Value Date   NA 136 11/16/2018   K 4.9 11/16/2018   CL 100 11/16/2018   CO2 28 11/16/2018   BUN 27 (H) 11/16/2018   CREATININE 1.26 (H) 11/16/2018   GLUCOSE 177 (H) 11/16/2018   ALT 16 05/25/2018    Lab Results  Component Value Date   CHOL 97 04/28/2018   HDL 20 (L) 04/28/2018   LDLCALC 43 04/28/2018   TRIG 169 (H) 04/28/2018   CHOLHDL 4.9 04/28/2018    --------------------------------------------------------------------------------------------------  ASSESSMENT AND  PLAN: Coronary artery disease The patient has not undergone cardiac catheterization a suspicion for underlying CAD.  He does not have any symptoms to suggest unstable coronary disease.  EKG and prior myocardial perfusion stress test in 2017 are consistent with prior inferior MI.  We have discussed proceeding with cardiac catheterization, though both [name] and I are somewhat reluctant to do this given her extensive vascular disease (she is adamant that she would not wish to undergo femoral access again) and other comorbidities including chronic kidney disease with AKI last summer blindness, and wound healing problems.  She also has poor radial pulses in both arms with history of upper extremity vascular disease.  Therefore, vascular access would be quite challenging.  We will focus on optimal medical therapy.  Cardiomyopathy (presumed ischemic) and HFrEF [name] appears euvolemic on exam today.  Her functional status is difficult to assess due to generalized immobility, though I suspect she has NYHA class II-III.  We have agreed to repeat an echocardiogram to see if her LVEF has improved.  If it remains low, we will consider adding an ACE inhibitor or ARB, as her renal function has improved further since September.  Persistent atrial fibrillation Atrial fibrillation occurred during hospitalization in June.  She has not had any symptoms of recurrence.  EKG today demonstrates sinus rhythm.  Discussed risks and benefits of long-term antiarrhythmic therapy with amiodarone.  Given that atrial fibrillation was likely precipitated by acute illness, we have agreed to discontinue amiodarone.  We will continue with rivaroxaban 15 mg daily (GFR < 50).  Hypertension Blood pressure normal today, though accuracy is questionable given upper and lower extremity vascular disease.  I will not make any changes to her regimen at this time.  Hyperlipidemia LDL at goal (less than 70) in 04/2018.  Continue rosuvastatin  20 mg daily.  Peripheral vascular disease Extensive and closely followed by vascular surgery.  Continue follow-up with Dr. Trula Slade, as previously arranged.  Chronic kidney disease stage III Renal function has continued to gradually improve over the last 6 months.  As above, we will consider adding an ACE inhibitor or ARB if LVEF remains diminished.  Renal function and electrolytes will need to be closely monitored.  Follow-up: Return to clinic in 2 months.  Nelva Bush, MD 11/21/2018 10:50 AM

## 2018-11-20 ENCOUNTER — Ambulatory Visit: Payer: Medicare Other | Admitting: Internal Medicine

## 2018-11-20 VITALS — BP 124/70 | HR 71 | Ht 67.0 in | Wt 157.0 lb

## 2018-11-20 DIAGNOSIS — I10 Essential (primary) hypertension: Secondary | ICD-10-CM

## 2018-11-20 DIAGNOSIS — I255 Ischemic cardiomyopathy: Secondary | ICD-10-CM

## 2018-11-20 DIAGNOSIS — N183 Chronic kidney disease, stage 3 unspecified: Secondary | ICD-10-CM

## 2018-11-20 DIAGNOSIS — I2510 Atherosclerotic heart disease of native coronary artery without angina pectoris: Secondary | ICD-10-CM | POA: Diagnosis not present

## 2018-11-20 DIAGNOSIS — I4819 Other persistent atrial fibrillation: Secondary | ICD-10-CM | POA: Diagnosis not present

## 2018-11-20 DIAGNOSIS — I739 Peripheral vascular disease, unspecified: Secondary | ICD-10-CM

## 2018-11-20 DIAGNOSIS — I5022 Chronic systolic (congestive) heart failure: Secondary | ICD-10-CM | POA: Diagnosis not present

## 2018-11-20 DIAGNOSIS — E785 Hyperlipidemia, unspecified: Secondary | ICD-10-CM

## 2018-11-20 NOTE — Patient Instructions (Addendum)
Medication Instructions:  Stop the Amiodarone  If you need a refill on your cardiac medications before your next appointment, please call your pharmacy.   Lab work: None ordered  Testing/Procedures: Your physician has requested that you have an echocardiogram. Echocardiography is a painless test that uses sound waves to create images of your heart. It provides your doctor with information about the size and shape of your heart and how well your heart's chambers and valves are working. You may receive an ultrasound enhancing agent through an IV if needed to better visualize your heart during the echo.This procedure takes approximately one hour. There are no restrictions for this procedure. This will take place at the [facility] clinic.    Follow-Up: At [facility], you and your health needs are our priority.  As part of our continuing mission to provide you with exceptional heart care, we have created designated Provider Care Teams.  These Care Teams include your primary Cardiologist (physician) and Advanced Practice Providers (APPs -  Physician Assistants and Nurse Practitioners) who all work together to provide you with the care you need, when you need it. You will need a follow up appointment in 2 months. You may see Nelva Bush, MD or one of the following Advanced Practice Providers on your designated Care Team:   Murray Hodgkins, NP Christell Faith, PA-C

## 2018-11-21 ENCOUNTER — Encounter: Payer: Self-pay | Admitting: Internal Medicine

## 2018-11-21 DIAGNOSIS — N189 Chronic kidney disease, unspecified: Secondary | ICD-10-CM | POA: Insufficient documentation

## 2018-11-21 DIAGNOSIS — I5022 Chronic systolic (congestive) heart failure: Secondary | ICD-10-CM | POA: Insufficient documentation

## 2018-11-21 DIAGNOSIS — I4819 Other persistent atrial fibrillation: Secondary | ICD-10-CM | POA: Insufficient documentation

## 2018-11-21 DIAGNOSIS — I255 Ischemic cardiomyopathy: Secondary | ICD-10-CM | POA: Insufficient documentation

## 2018-11-21 DIAGNOSIS — N183 Chronic kidney disease, stage 3 (moderate): Secondary | ICD-10-CM

## 2018-11-21 DIAGNOSIS — I10 Essential (primary) hypertension: Secondary | ICD-10-CM | POA: Insufficient documentation

## 2018-11-21 DIAGNOSIS — I2510 Atherosclerotic heart disease of native coronary artery without angina pectoris: Secondary | ICD-10-CM | POA: Insufficient documentation

## 2018-11-25 NOTE — Addendum Note (Signed)
Addended by: Britt Bottom on: 11/25/2018 03:12 PM   Modules accepted: Orders

## 2018-12-02 ENCOUNTER — Telehealth: Payer: Self-pay | Admitting: *Deleted

## 2018-12-16 ENCOUNTER — Ambulatory Visit (INDEPENDENT_AMBULATORY_CARE_PROVIDER_SITE_OTHER): Payer: Medicare Other

## 2018-12-16 DIAGNOSIS — I255 Ischemic cardiomyopathy: Secondary | ICD-10-CM | POA: Diagnosis not present

## 2019-01-19 NOTE — Progress Notes (Signed)
Cardiology Office Note Date:  01/22/2019  Patient ID:  [name], [name] [birth year], MRN [id number] PCP:  Frazier Richards, MD  Cardiologist:  Dr. Saunders Revel, MD    Chief Complaint: Follow up  History of Present Illness: [name] is a 53 y.o. female with history of CAD, HFrEF secondary to presumed ischemic cardiomyopathy, PAF on Xarelto, severe PAD with multiple upper and lower extremity interventions with history of osteomyelitis and gangrene status post left AKA complicated by non-STEMI in 05/2018, carotid artery disease, diabetes mellitus with diabetic nephropathy, anemia, ongoing tobacco abuse, HLD, depression, and GERD who presents for follow-up of CAD,HFrEF and PAF.  Nuclear stress test in 2017 consistent with prior inferior MI.  Se was admitted to the hospital in 03/2018 with worsening left leg pain and initially required left femoral to PTA bypass which was complicated by thrombus formation requiring thrombectomy and subsequently left above-the-knee amputation.  In that setting, she also developed sepsis, hypotension, and PAF requiring initiation of amiodarone.  This was further complicated by a non-STEMI, with troponin elevation to 7.  Echo at that time showed new LV systolic dysfunction with an EF of 30 to 35%.  She also developed acute kidney injury briefly requiring hemodialysis.  She was evaluated by cardiology with recommendation for medical therapy in the setting of multiple comorbidities.  In hospital follow-up in 07/2018 she was doing reasonably well.  She preferred to defer outpatient diagnostic cath as she was recovering from her recent admission earlier in the summer.  She subsequently canceled her follow-ups in the months of October and November.  She was last seen in the office on 11/20/2018 and noted continued improvement.  She was working with physical therapy following fitting for her left lower extremity prosthesis.  Given the patient's significant comorbid conditions including  severe PAD she was reluctant to move forward with diagnostic cardiac cath given she was feeling well.  In this setting, optimization of medical therapy was recommended.  Given her age and that she was maintaining sinus rhythm, amiodarone was discontinued.  She underwent echo on 12/16/2018 to evaluate for improved LV systolic function which showed an improved EF of 50 to 55%, normal LV cavity size, diastolic dysfunction, RV systolic pressure normal, mildly dilated left atrium, trivial mitral regurgitation.  She comes in accompanied by her husband today and reports she is doing well from a cardiac perspective.  She denies any chest pain, shortness of breath, palpitations, dizziness, presyncope, or syncope.  She continues to work with physical therapy following fitting of her left KA prosthesis.  No stump swelling.  No orthopnea.  She remains on Xarelto without significant bleeding.  No falls.  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  She is in a wheelchair today.  She continues to smoke 1 pack daily and is not interested in quitting.  Labs: 10/2018 - Hgb 13.7, PLT 218, potassium 4.9, serum creatinine 1.26 06/2018 - Albumin 3.8, alkaline phosphatase 148, AST/ALT normal 04/2018 - LDL 43  Past Medical History:  Diagnosis Date  . Abnormal stress test    a. 02/2017 MV: large region of fixed perfusion defect in basal to mid inf, mid-dist inflat walls, EF 43%. No ischemia (EF 55-60% by f/u echo).  . Arthritis   . Asthma   . Carotid arterial disease ([place])    a. 09/2017 Carotid U/S: 40-49% bilat ICA stenosis.  . Chronic back pain   . Coronary artery calcification seen on CT scan  a. 11/2017 CT Abd/Pelvis: Multi vessel coronary vascular Ca2+.  . Depression   . Diabetes mellitus   . Diabetic neuropathy ([place])   . Difficult intubation    DIFFICULT AIRWAY/FYI  . Family history of adverse reaction to anesthesia    mother had difficlty waking   . Femoral-popliteal bypass graft  occlusion, left ([place]) 12/02/2017  . GERD (gastroesophageal reflux disease)   . History of echocardiogram    a. 03/2017 Echo: EF 55-60%, mild LVH, nl RV fxn.  . Hyperlipidemia   . Ischemic cardiomyopathy    a. 04/2018 Echo: EF 30-35%, Gr2 DD, mild LVH. Mild MR. Mildly dil LA, mod dil RV w/ mod red RV fxn, mild TR, PASP 30mmHg.  . NSTEMI (non-ST elevated myocardial infarction) ([place])    a. 05/2018 in setting of Afib, sepsis, and post-op L AKA. Peak trop 7.7. EF 30-35% by echo-->cath not performed 2/2 renal failure.  . Osteomyelitis of right fibula ([place]) 03/05/2017  . PAD (peripheral artery disease) ([place])    a. S/p L fem-pop bypass; b. 11/2017 s/p Aortobifem bypass 2/2 graft occlusion; c. 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  . Paroxysmal atrial fibrillation with rapid ventricular response ([place]) 12/02/2017   a. CHA2DS2VASc = 3-->Xarelto; b. 05/2018 Recurrent Afib-->amio.  Marland Kitchen Ulcer    Foot    Past Surgical History:  Procedure Laterality Date  . ABDOMINAL AORTAGRAM  June 15, 2014  . ABDOMINAL AORTAGRAM N/A 06/15/2014   Procedure: ABDOMINAL Maxcine Ham;  Surgeon: Serafina Mitchell, MD;  Location: [facility] CATH LAB;  Service: Cardiovascular;  Laterality: N/A;  . ABDOMINAL AORTAGRAM N/A 11/22/2014   Procedure: ABDOMINAL AORTAGRAM;  Surgeon: Serafina Mitchell, MD;  Location: [facility] CATH LAB;  Service: Cardiovascular;  Laterality: N/A;  . ABDOMINAL AORTOGRAM W/LOWER EXTREMITY N/A 01/07/2017   Procedure: Abdominal Aortogram w/Lower Extremity;  Surgeon: Serafina Mitchell, MD;  Location: [place] CV LAB;  Service: Cardiovascular;  Laterality: N/A;  . ABDOMINAL AORTOGRAM W/LOWER EXTREMITY N/A 10/31/2017   Procedure: ABDOMINAL AORTOGRAM W/LOWER EXTREMITY;  Surgeon: Elam Dutch, MD;  Location: [place] CV LAB;  Service: Cardiovascular;  Laterality: N/A;  . ABDOMINAL AORTOGRAM W/LOWER EXTREMITY N/A 03/24/2018   Procedure: ABDOMINAL AORTOGRAM W/LOWER EXTREMITY;  Surgeon: Serafina Mitchell,  MD;  Location: [place] CV LAB;  Service: Cardiovascular;  Laterality: N/A;  . AMPUTATION Left 04/26/2018   Procedure: AMPUTATION ABOVE KNEE;  Surgeon: Elam Dutch, MD;  Location: [place];  Service: Vascular;  Laterality: Left;  . AORTA - BILATERAL FEMORAL ARTERY BYPASS GRAFT N/A 11/28/2017   Procedure: AORTA BIFEMORAL BYPASS USING HEMASHIELD GOLD GRAFT & REIMPLANT IMA;  Surgeon: Serafina Mitchell, MD;  Location: [facility] OR;  Service: Vascular;  Laterality: N/A;  . AORTIC ARCH ANGIOGRAPHY N/A 10/31/2017   Procedure: AORTIC ARCH ANGIOGRAPHY;  Surgeon: Elam Dutch, MD;  Location: [place] CV LAB;  Service: Cardiovascular;  Laterality: N/A;  . APPLICATION OF WOUND VAC  11/28/2017   Procedure: APPLICATION OF WOUND VAC;  Surgeon: Serafina Mitchell, MD;  Location: [facility] OR;  Service: Vascular;;  . APPLICATION OF WOUND VAC Left 03/27/2018   Procedure: APPLICATION OF WOUND VAC LEFT GROIN USING PREVENA PLUS;  Surgeon: Serafina Mitchell, MD;  Location: [place];  Service: Vascular;  Laterality: Left;  . ARTERIAL BYPASS SURGERY   07/05/2010   Right Common Femoral to below knee popliteal BPG  . BACK SURGERY     X's  2  . CARDIAC CATHETERIZATION    .  CHOLECYSTECTOMY     Gall Bladder  . CYSTECTOMY Left    wrist  . EMBOLECTOMY Left 11/28/2017   Procedure: Left Lower Extremity Embolectomy, Left Tibial Peroneal Trunk Endarterectomy with Patch Angioplasty; Vein Harvest Small Saphenous Graft Left Lower Leg;  Surgeon: [name], MD;  Location: [place];  Service: Vascular;  Laterality: Left;  . FEMORAL-POPLITEAL BYPASS GRAFT Left 03/27/2018   Procedure: THROMBECTOMY OF LEFT FEMORAL TIBIAL BYPASS;  Surgeon: Serafina Mitchell, MD;  Location: [place];  Service: Vascular;  Laterality: Left;  . FEMORAL-TIBIAL BYPASS GRAFT Left 03/27/2018   Procedure: BYPASS GRAFT FEMORAL-TIBIAL ARTERY LEFT REDO USING CRYOPRESERVED SAPHENOUS VEIN 70cm;  Surgeon: Serafina Mitchell, MD;  Location: [facility] OR;  Service: Vascular;   Laterality: Left;  . INTERCOSTAL NERVE BLOCK  November 2015  . INTRAOPERATIVE ARTERIOGRAM  11/28/2017   Procedure: INTRA OPERATIVE ARTERIOGRAM OF LEFT LEG;  Surgeon: Serafina Mitchell, MD;  Location: [place];  Service: Vascular;;  . INTRAOPERATIVE ARTERIOGRAM Left 03/27/2018   Procedure: INTRA OPERATIVE ARTERIOGRAM TIMES TWO;  Surgeon: Serafina Mitchell, MD;  Location: [place];  Service: Vascular;  Laterality: Left;  . IR FLUORO GUIDE CV LINE RIGHT  03/20/2017  . IR FLUORO GUIDE CV LINE RIGHT  05/05/2018  . IR REMOVAL TUN CV CATH W/O FL  05/20/2018  . IR US GUIDE VASC ACCESS RIGHT  03/20/2017  . IR US GUIDE VASC ACCESS RIGHT  05/05/2018  . IRRIGATION AND DEBRIDEMENT BUTTOCKS Right 09/30/2016   Procedure: DEBRIDEMENT RIGHT  BUTTOCK WOUND;  Surgeon: Georganna Skeans, MD;  Location: [place];  Service: General;  Laterality: Right;  . left foot surgery    . PERIPHERAL VASCULAR CATHETERIZATION N/A 05/07/2016   Procedure: Abdominal Aortogram;  Surgeon: Serafina Mitchell, MD;  Location: [place] CV LAB;  Service: Cardiovascular;  Laterality: N/A;  . PERIPHERAL VASCULAR CATHETERIZATION N/A 05/07/2016   Procedure: Lower Extremity Angiography;  Surgeon: Serafina Mitchell, MD;  Location: [place] CV LAB;  Service: Cardiovascular;  Laterality: N/A;  . PERIPHERAL VASCULAR CATHETERIZATION N/A 05/07/2016   Procedure: Aortic Arch Angiography;  Surgeon: Serafina Mitchell, MD;  Location: [place] CV LAB;  Service: Cardiovascular;  Laterality: N/A;  . PERIPHERAL VASCULAR CATHETERIZATION N/A 05/07/2016   Procedure: Upper Extremity Angiography;  Surgeon: Serafina Mitchell, MD;  Location: [place] CV LAB;  Service: Cardiovascular;  Laterality: N/A;  . PERIPHERAL VASCULAR CATHETERIZATION Right 05/07/2016   Procedure: Peripheral Vascular Balloon Angioplasty;  Surgeon: Serafina Mitchell, MD;  Location: [place] CV LAB;  Service: Cardiovascular;  Laterality: Right;  subclavian  . PERIPHERAL VASCULAR CATHETERIZATION Right 05/07/2016    Procedure: Peripheral Vascular Intervention;  Surgeon: Serafina Mitchell, MD;  Location: [place] CV LAB;  Service: Cardiovascular;  Laterality: Right;  External  Iliac  . SKIN GRAFT Right 2012   [name] by Dr. Nils Pyle- Right and Left Ankle  . SPINE SURGERY    . THROMBECTOMY FEMORAL ARTERY  11/28/2017   Procedure: THROMBECTOMY  & REVISION OF BILATERAL FEMORAL TO POPLETEAL ARTERIES;  Surgeon: Serafina Mitchell, MD;  Location: [facility] OR;  Service: Vascular;;  . TONSILLECTOMY      Current Meds  Medication Sig  . albuterol (PROVENTIL) 2 MG tablet Take 2 mg by mouth 3 (three) times daily as needed for shortness of breath.   Marland Kitchen amitriptyline (ELAVIL) 25 MG tablet TAKE 1 TABLET BY MOUTH AT BEDTIME FOR LEG PAIN  . cetirizine (ZYRTEC) 10 MG tablet Take 10 mg by mouth daily.   Marland Kitchen  diphenoxylate-atropine (LOMOTIL) 2.5-0.025 MG tablet Take 1 tablet by mouth 4 (four) times daily as needed for diarrhea or loose stools.  . ferrous sulfate 325 (65 FE) MG tablet Take 1 tablet (325 mg total) by mouth 2 (two) times daily with a meal.  . furosemide (LASIX) 80 MG tablet Take 1 tablet (80 mg total) by mouth daily.  Marland Kitchen HYDROcodone-acetaminophen (NORCO) 10-325 MG tablet LIMIT 1 2 TO 1 (ONE HALF TO ONE) TABLET BY MOUTH PER DAY OR 2 4 TIMES DAILY IF TOLERATED. NO OXYCODONE.  Marland Kitchen insulin glargine (LANTUS) 100 UNIT/ML injection Inject 0.18 mLs (18 Units total) into the skin 2 (two) times daily.  . insulin lispro (HUMALOG) 100 UNIT/ML injection For glucose 121-150 use TWO units,  for 151-200 use FOUR units, for 201-250 use SIX units, for 251-300 use EIGHT units, for 301-350 use TEN units. For blood sugars greater than 350 call MD.  . loperamide (IMODIUM A-D) 2 MG tablet Take 12 mg by mouth daily as needed for diarrhea or loose stools.  . metoprolol tartrate (LOPRESSOR) 25 MG tablet Take 0.5 tablets (12.5 mg total) by mouth 2 (two) times daily.  . nitroGLYCERIN (NITROSTAT) 0.4 MG SL tablet Place 1 tablet (0.4 mg total) under the tongue  every 5 (five) minutes as needed for chest pain.  Marland Kitchen omeprazole (PRILOSEC) 20 MG capsule Take 20 mg by mouth daily.  . ondansetron (ZOFRAN-ODT) 4 MG disintegrating tablet DISSOLVE 1 TABLET IN MOUTH EVERY 8 HOURS AS NEEDED FOR NAUSEA OR VOMITING  . pregabalin (LYRICA) 50 MG capsule Take 1 capsule (50 mg total) by mouth 2 (two) times daily.  . Rivaroxaban (XARELTO) 15 MG TABS tablet Take 1 tablet (15 mg total) by mouth daily with supper.  . rosuvastatin (CRESTOR) 20 MG tablet Take 1 tablet (20 mg total) by mouth daily at 6 PM.    Allergies:   Lactose intolerance (gi)   Social History:  The patient  reports that she has been smoking cigarettes. She has a 30.00 pack-year smoking history. She has never used smokeless tobacco. She reports that she does not drink alcohol or use drugs.   Family History:  The patient's family history includes Coronary artery disease in her mother; Diabetes in her brother, father, maternal grandmother, mother, paternal grandfather, paternal grandmother, and sister; Heart attack in her mother; Heart disease in her father and mother; Hyperlipidemia in her mother; Hypertension in her brother, mother, and sister; Other in her mother; Peripheral vascular disease in her mother; Varicose Veins in her mother.  ROS:   ROS   PHYSICAL EXAM:  VS:  BP (!) 88/60 (BP Location: Left Arm, Patient Position: Sitting, Cuff Size: Normal)   Pulse 76   Ht 5\' 7"  (1.702 m)   Wt 160 lb 8 oz (72.8 kg)   BMI 25.14 kg/m  BMI: Body mass index is 25.14 kg/m.  Physical Exam  Constitutional: She is oriented to person, place, and time. She appears well-developed and well-nourished.  Strong odor of tobacco  HENT:  Head: Normocephalic and atraumatic.  Eyes: Right eye exhibits no discharge. Left eye exhibits no discharge.  Neck: Normal range of motion. No JVD present.  Cardiovascular: Normal rate, regular rhythm, S1 normal, S2 normal and normal heart sounds. Exam reveals no distant heart sounds,  no friction rub, no midsystolic click and no opening snap.  No murmur heard. Right lower extremity and bilateral radial pulses difficult to palpate.  Pulmonary/Chest: Effort normal and breath sounds normal. No respiratory distress. She has no decreased  breath sounds. She has no wheezes. She has no rales. She exhibits no tenderness.  Abdominal: Soft. She exhibits no distension. There is no abdominal tenderness.  Musculoskeletal:        General: No edema.     Comments: Status post left AKA.  No stump swelling  Neurological: She is alert and oriented to person, place, and time.  Skin: Skin is warm and dry. No cyanosis. Nails show no clubbing.  Psychiatric: She has a normal mood and affect. Her speech is normal and behavior is normal. Judgment and thought content normal.     EKG:  Was ordered and interpreted by me today. Shows NSR, 76 bpm, prior inferior infarct, nonspecific ST-T changes  Recent Labs: 05/09/2018: TSH 2.708 05/22/2018: Magnesium 1.6 05/25/2018: ALT 16 11/16/2018: BUN 27; Creatinine, Ser 1.26; Hemoglobin 13.7; Platelets 218; Potassium 4.9; Sodium 136  04/28/2018: Cholesterol 97; HDL 20; LDL Cholesterol 43; Total CHOL/HDL Ratio 4.9; Triglycerides 169; VLDL 34   CrCl cannot be calculated (Patient's most recent lab result is older than the maximum 21 days allowed.).   Wt Readings from Last 3 Encounters:  01/22/19 160 lb 8 oz (72.8 kg)  11/20/18 157 lb (71.2 kg)  07/29/18 150 lb (68 kg)     Other studies reviewed: Additional studies/records reviewed today include: summarized above  ASSESSMENT AND PLAN:  1. CAD: Presumed CAD given prior nuclear stress test and old EKG changes.  She continues to improve and has no symptoms suggestive of unstable angina.  She continues to request deferment of invasive cardiac ischemia evaluation.  Given her comorbid conditions including but not limited to vascular access complications/issues, CKD, and progressive blindness and lack of anginal  symptoms, this is reasonable.  We will continue current medications with recommendation to reduce risk factors including smoking cessation as outlined below.  2. HFrEF secondary to presumed ICM with subsequent normalization of EF: She appears euvolemic and well compensated on exam today.  Her functional status is difficult to assess secondary to generalized immobility and progressive blindness.  Recent echo demonstrated near normalization of LV systolic function with an EF of 55%.  Continue Lopressor and Lasix.  Check BMP today to assess for stable renal function.  At this time, we will defer initiation of ACE inhibitor or ARB given subsequent normalization of EF and underlying kidney disease.  This will be deferred to nephrology as outlined below.  3. PAF: Maintaining sinus rhythm.  Continue Lopressor for rate control.  CrCl from 10/2018 calculated to be 58.66.  With this most recent calculation, she would qualify for regular dose Xarelto.  However, she has had issues with her renal function leading to the decreased dosing of her Xarelto.  Check BMP today for further clarification of anticoagulation dosing.  For now, until this BMP is back, she will continue Xarelto 15 mg daily.  4. Hypotension: Uncertain of the accuracy given the patient's extensive upper extremity peripheral vascular disease.  Asymptomatic.  No changes in Lasix and metoprolol.  5. Hyperlipidemia: LDL 43 from 04/2018 with normal AST/ALT from 06/2018.  Remains on Crestor 20 mg daily.  6. PVD: Patient has extensive upper and lower extremity vascular disease as outlined above.  Continue follow-up with vascular surgery as directed.  7. CKD stage III: Most recent serum creatinine improved to 1.2 as above.  She will need continued close monitoring of her renal function.  Consider referral to nephrology by PCP if not already done.  Defer initiation of ACE inhibitor/ARB to PCP/nephrology given no further need  from a cardiomyopathy standpoint and  in the uncertain validity of her BP as above.  8. Ongoing tobacco abuse: Patient continues to smoke 1 pack daily.  She is uninterested in quitting.  Smoking cessation is strongly encouraged.  Disposition: F/u with Dr. Saunders Revel or an APP in 6 months at the patient request, sooner if needed.  Current medicines are reviewed at length with the patient today.  The patient did not have any concerns regarding medicines.  Signed, Christell Faith, PA-C 01/22/2019 3:18 PM     [address] [place], [place] [address] [phone]

## 2019-01-22 ENCOUNTER — Ambulatory Visit: Payer: Medicare Other | Admitting: Physician Assistant

## 2019-01-22 ENCOUNTER — Encounter: Payer: Self-pay | Admitting: Physician Assistant

## 2019-01-22 VITALS — BP 88/60 | HR 76 | Ht 67.0 in | Wt 160.5 lb

## 2019-01-22 DIAGNOSIS — Z720 Tobacco use: Secondary | ICD-10-CM

## 2019-01-22 DIAGNOSIS — I959 Hypotension, unspecified: Secondary | ICD-10-CM

## 2019-01-22 DIAGNOSIS — I480 Paroxysmal atrial fibrillation: Secondary | ICD-10-CM | POA: Diagnosis not present

## 2019-01-22 DIAGNOSIS — I255 Ischemic cardiomyopathy: Secondary | ICD-10-CM | POA: Diagnosis not present

## 2019-01-22 DIAGNOSIS — N183 Chronic kidney disease, stage 3 unspecified: Secondary | ICD-10-CM

## 2019-01-22 DIAGNOSIS — I2510 Atherosclerotic heart disease of native coronary artery without angina pectoris: Secondary | ICD-10-CM

## 2019-01-22 DIAGNOSIS — I739 Peripheral vascular disease, unspecified: Secondary | ICD-10-CM

## 2019-01-22 DIAGNOSIS — E785 Hyperlipidemia, unspecified: Secondary | ICD-10-CM

## 2019-01-22 NOTE — Patient Instructions (Signed)
Medication Instructions:  Your physician recommends that you continue on your current medications as directed. Please refer to the Current Medication list given to you today.  If you need a refill on your cardiac medications before your next appointment, please call your pharmacy.   Lab work: Art gallery manager today If you have labs (blood work) drawn today and your tests are completely normal, you will receive your results only by: Marland Kitchen MyChart Message (if you have MyChart) OR . A paper copy in the mail If you have any lab test that is abnormal or we need to change your treatment, we will call you to review the results.  Testing/Procedures: None ordered  Follow-Up: At [facility], you and your health needs are our priority.  As part of our continuing mission to provide you with exceptional heart care, we have created designated Provider Care Teams.  These Care Teams include your primary Cardiologist (physician) and Advanced Practice Providers (APPs -  Physician Assistants and Nurse Practitioners) who all work together to provide you with the care you need, when you need it. You will need a follow up appointment in 6 months.  Please call our office 2 months in advance to schedule this appointment.  You may see Nelva Bush, MD or one of the following Advanced Practice Providers on your designated Care Team:   Murray Hodgkins, NP Christell Faith, PA-C . Marrianne Mood, PA-C

## 2019-01-23 LAB — BASIC METABOLIC PANEL
BUN / CREAT RATIO: 18 (ref 9–23)
BUN: 29 mg/dL — AB (ref 6–24)
CALCIUM: 8.7 mg/dL (ref 8.7–10.2)
CHLORIDE: 95 mmol/L — AB (ref 96–106)
CO2: 26 mmol/L (ref 20–29)
Creatinine, Ser: 1.6 mg/dL — ABNORMAL HIGH (ref 0.57–1.00)
GFR, EST AFRICAN AMERICAN: 42 mL/min/{1.73_m2} — AB (ref >59)
GFR, EST NON AFRICAN AMERICAN: 36 mL/min/{1.73_m2} — AB (ref >59)
Glucose: 187 mg/dL — ABNORMAL HIGH (ref 65–99)
POTASSIUM: 3.7 mmol/L (ref 3.5–5.2)
Sodium: 137 mmol/L (ref 134–144)

## 2019-01-25 ENCOUNTER — Telehealth: Payer: Self-pay

## 2019-01-25 MED ORDER — FUROSEMIDE 40 MG PO TABS: 40.0000 mg | ORAL_TABLET | Freq: Every day | ORAL | 5 refills | Status: DC

## 2019-01-25 NOTE — Telephone Encounter (Signed)
Call to patient to review labs and recommendations from provider. She verbalized understanding and had no further questions at this time.    Furosemide order changed to 1 tablet (40 mg total ) once daily.   Advised pt to call for any further questions or concerns.

## 2019-01-25 NOTE — Telephone Encounter (Signed)
-----   Message from Rise Mu, PA-C sent at 01/23/2019 10:13 AM EST ----- Random glucose is elevated in the setting of known diabetes.  Renal function is elevated when compared to her prior reading.  Potassium is ok, especially in the setting of CKD.  Recommend she hold Lasix for 2 days, then go back on Lasix at 40 mg daily.  She is not currently taking KCl, that is ok given CKD.

## 2019-01-29 ENCOUNTER — Encounter: Payer: Self-pay | Admitting: Physician Assistant

## 2019-02-08 ENCOUNTER — Ambulatory Visit: Payer: Medicare Other | Admitting: Physical Therapy

## 2019-06-04 IMAGING — CR DG ANKLE COMPLETE 3+V*R*
3 series · 3 of 3 positions shown · non-contrast
Comparison: MRI of the right ankle December 24, 2016

CLINICAL DATA: Chronic cutaneous ulcer over the lateral malleolar
region for several years. History of diabetes.

EXAM:
RIGHT ANKLE - COMPLETE 3+ VIEW

[t ankle joint ap right]
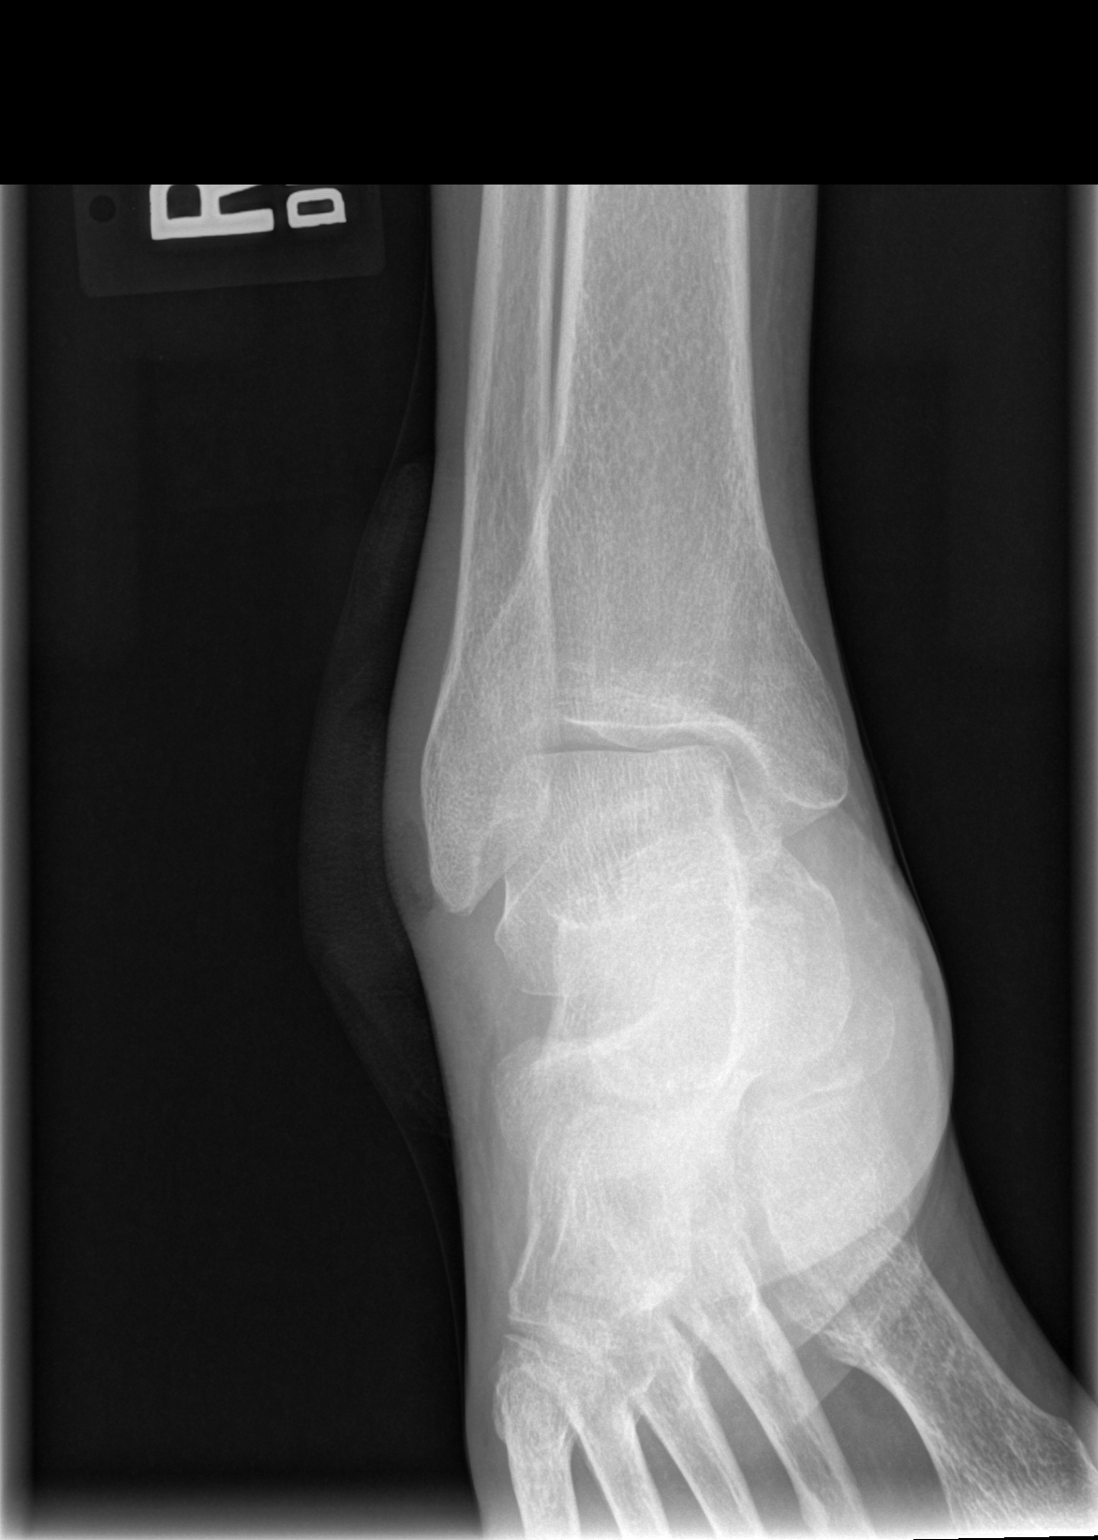

[t ankle joint oblique right]
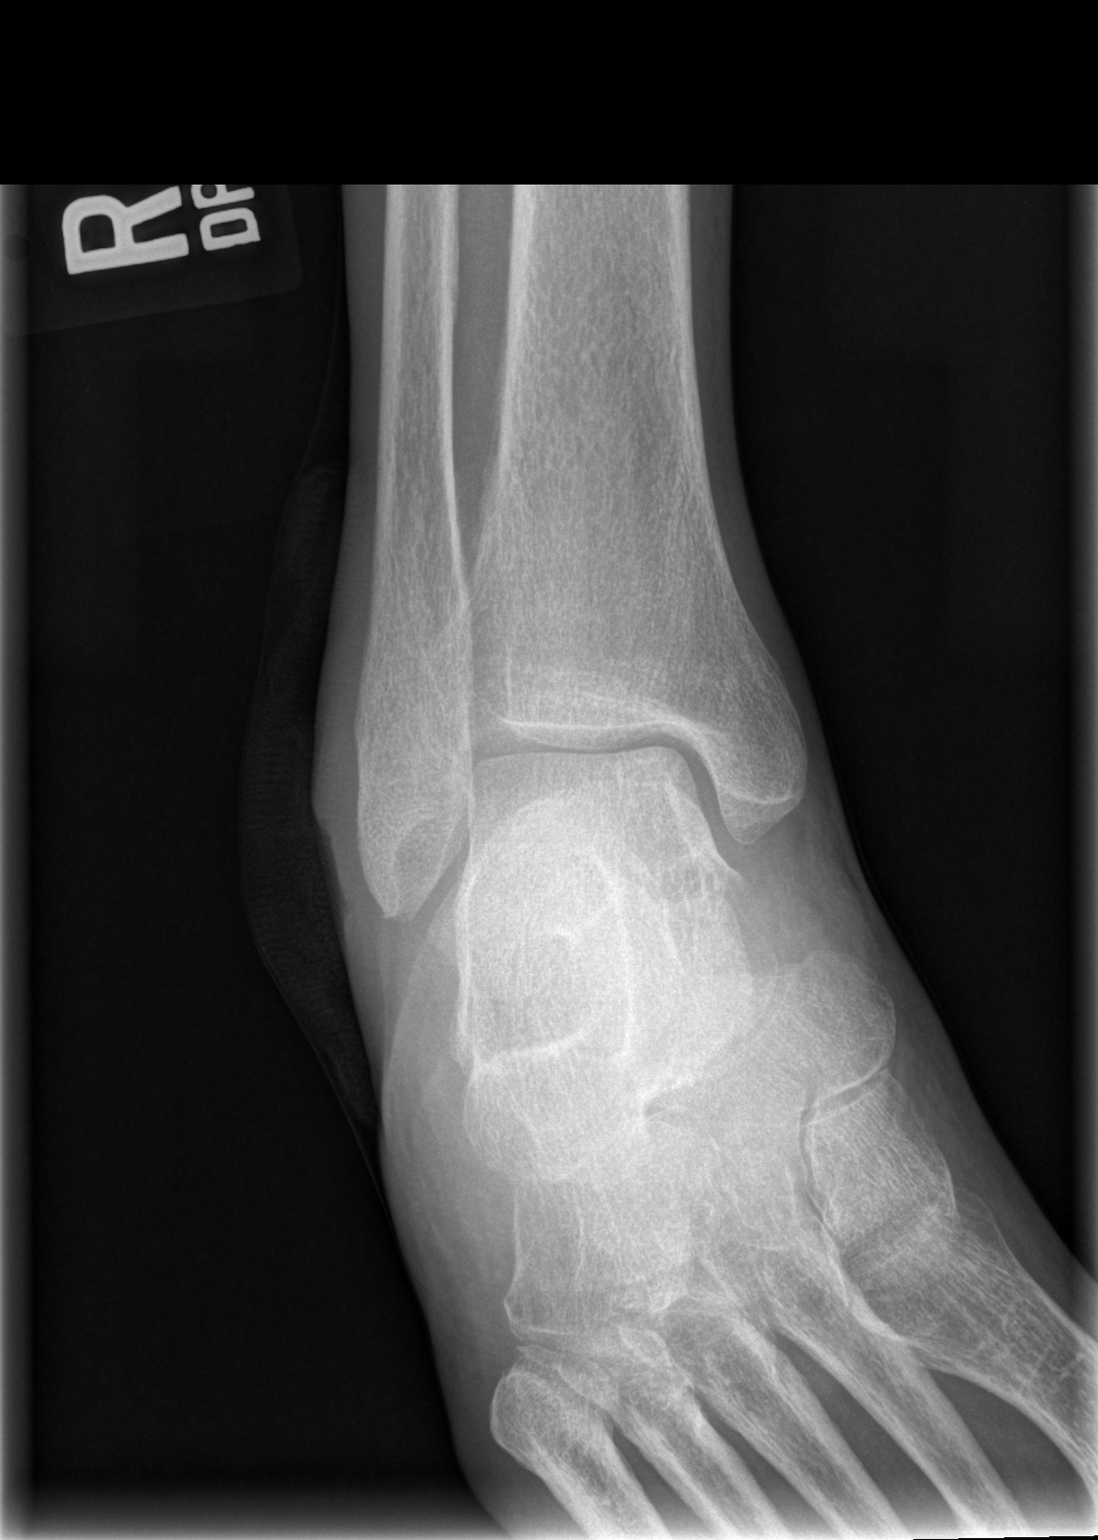

[t ankle joint lat right]
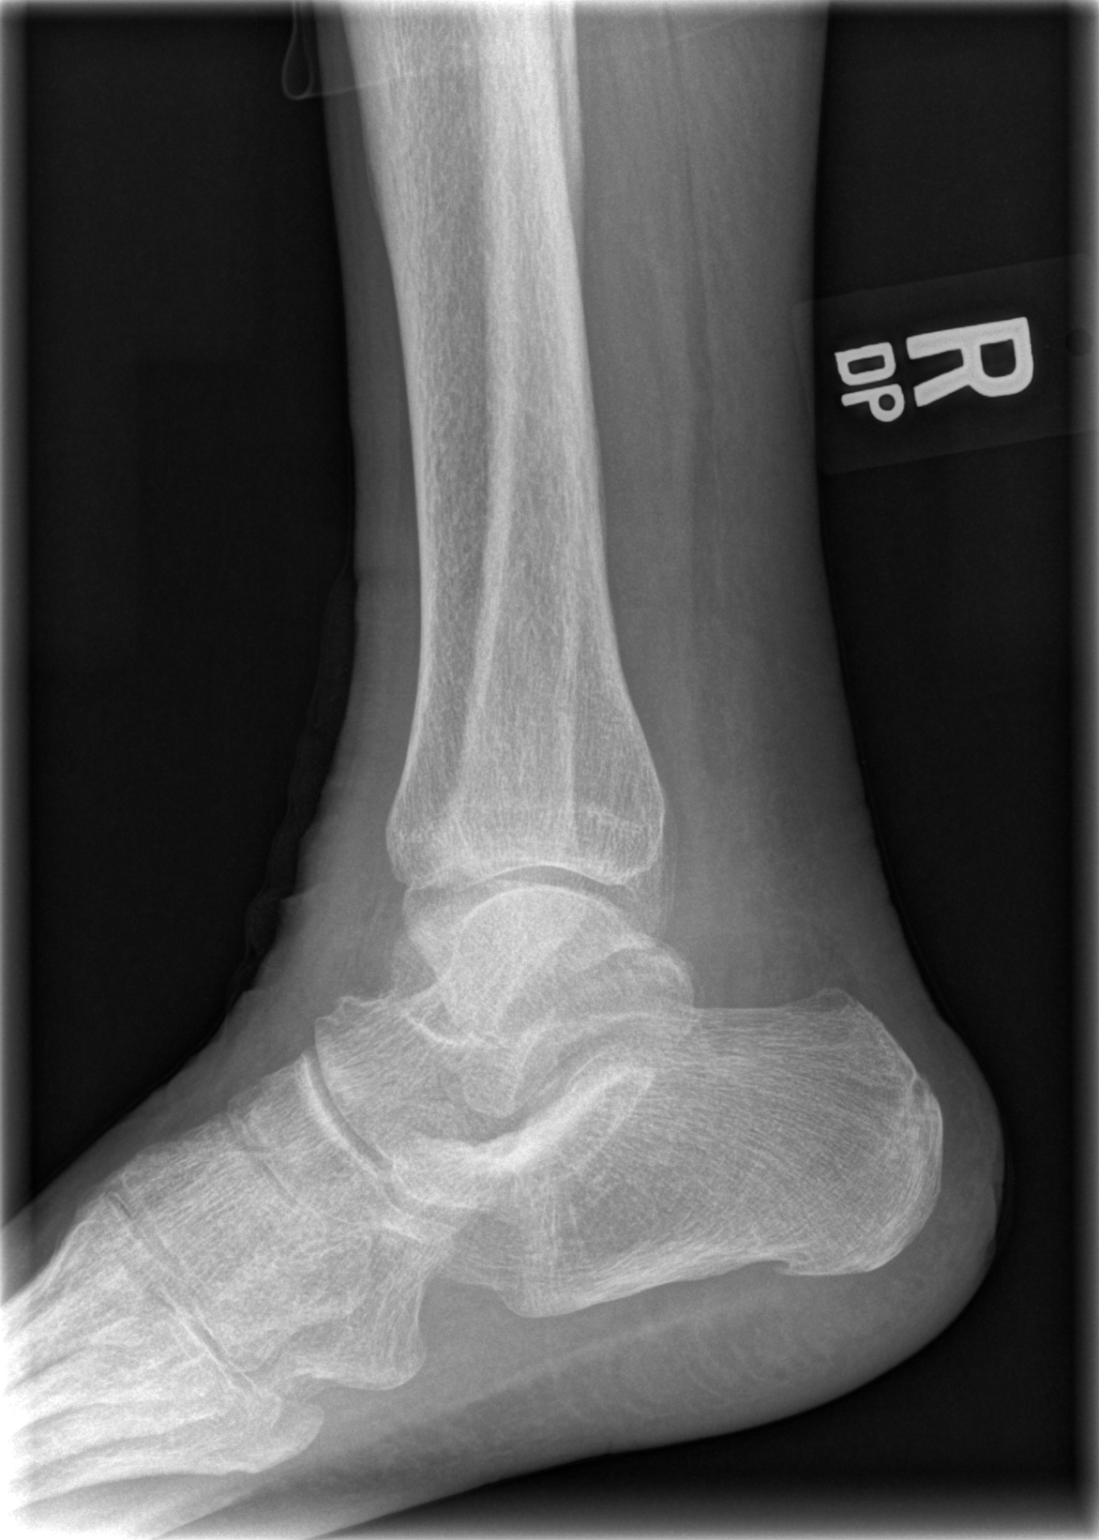

[3 of 3 positions shown; findings below may reference images not displayed]

FINDINGS: The bones are subjectively adequately mineralized. There is slight
loss of the sharp cortical margin along the inferior tip of the
lateral malleolus. No lytic or blastic lesion of the distal fibula
is observed. The overlying soft tissues exhibit a cutaneous ulcer
and some swelling. Elsewhere the bony structures exhibit no acute
abnormalities either.
IMPRESSION: Findings compatible with cellulitis of the lateral malleolar region.
A small focus of bony destruction involving the tip of the lateral
malleolus is worrisome for osteomyelitis.

## 2019-07-23 ENCOUNTER — Other Ambulatory Visit: Payer: Self-pay

## 2019-07-23 MED ORDER — ROSUVASTATIN CALCIUM 20 MG PO TABS: 20.0000 mg | ORAL_TABLET | Freq: Every day | ORAL | 5 refills | Status: DC

## 2019-09-21 NOTE — Telephone Encounter (Signed)
error 

## 2020-01-17 IMAGING — DX DG CHEST 1V PORT
1 series · 1 of 1 positions shown · non-contrast
Comparison: 12/02/2017

CLINICAL DATA: Central line placement.

EXAM:
PORTABLE CHEST 1 VIEW

[chest ap]
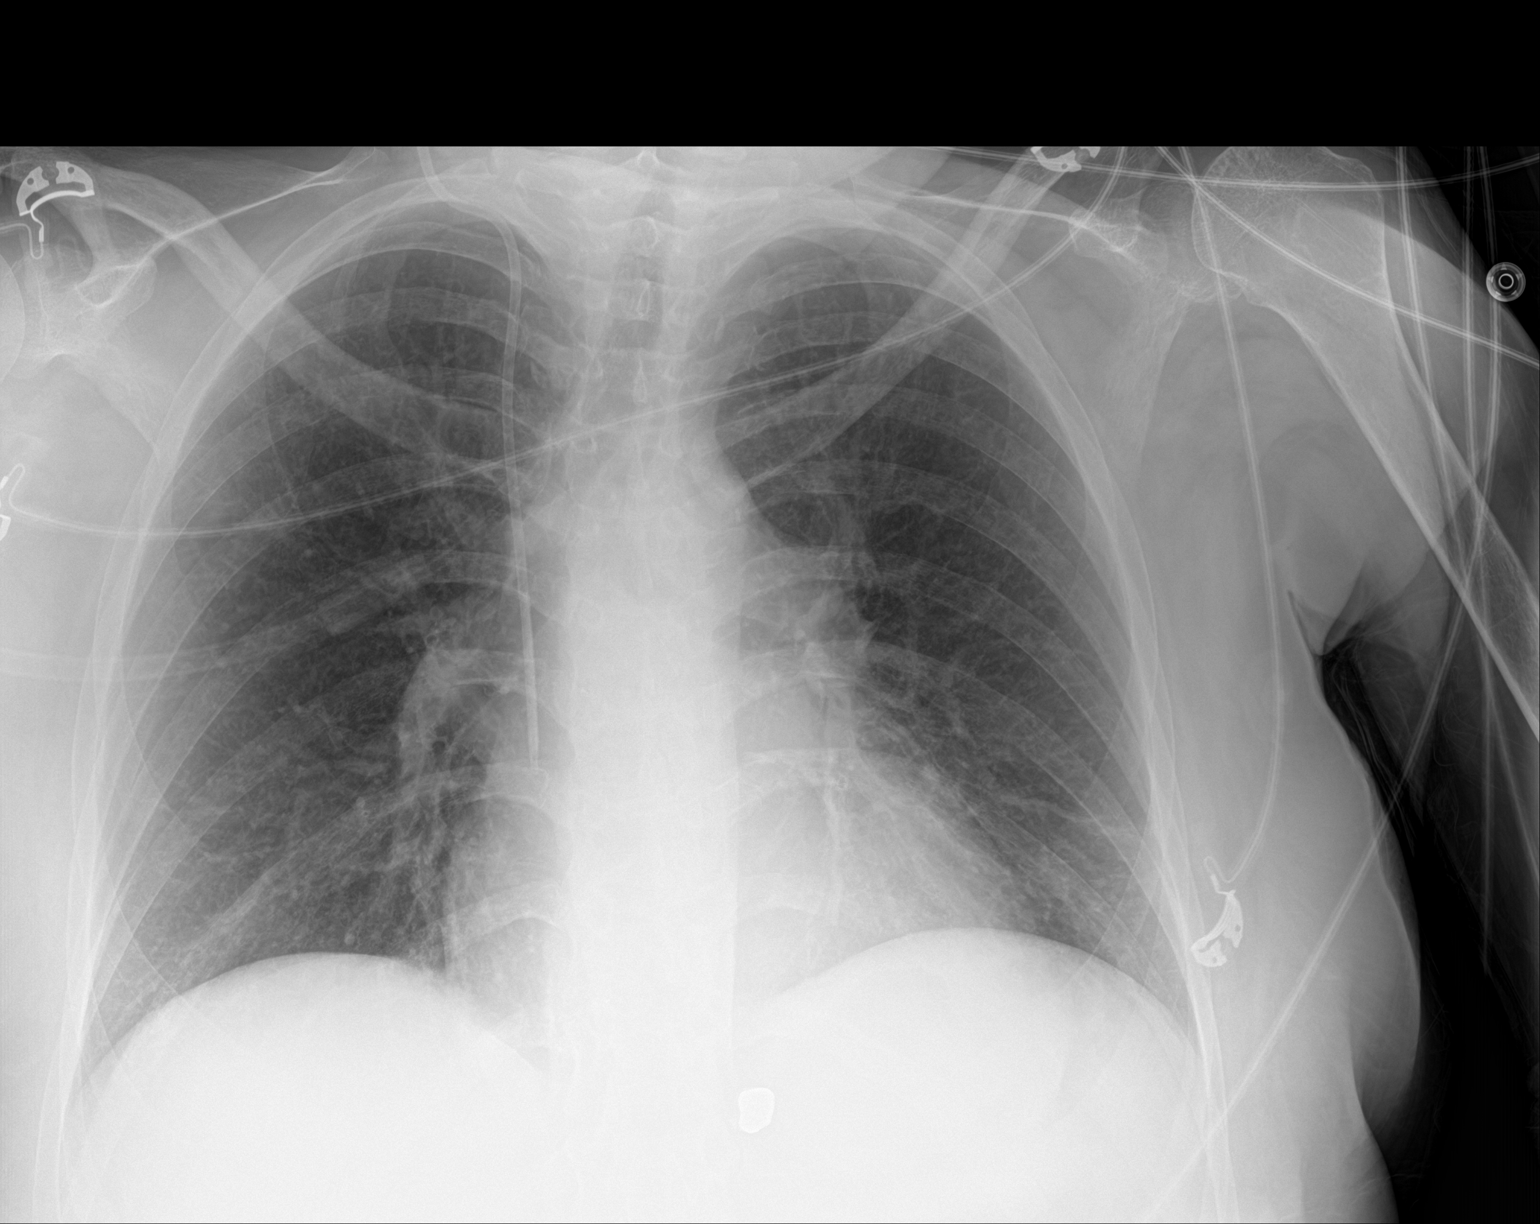

[1 of 1 positions shown; findings below may reference images not displayed]

FINDINGS: Right internal jugular single-lumen central line tip is at the SVC
RA junction. No pneumothorax. Heart size is normal. The lungs are
clear. The vascularity is normal. No effusions. No significant bone
finding.
IMPRESSION: Right internal jugular central line tip at the SVC RA junction. No
pneumothorax. No active disease.

## 2020-02-23 ENCOUNTER — Encounter (INDEPENDENT_AMBULATORY_CARE_PROVIDER_SITE_OTHER): Payer: Medicare Other

## 2020-02-23 DIAGNOSIS — E103592 Type 1 diabetes mellitus with proliferative diabetic retinopathy without macular edema, left eye: Secondary | ICD-10-CM | POA: Diagnosis not present

## 2020-02-23 HISTORY — PX: EYE SURGERY: SHX253

## 2020-02-24 ENCOUNTER — Other Ambulatory Visit: Payer: Self-pay

## 2020-02-24 ENCOUNTER — Encounter (INDEPENDENT_AMBULATORY_CARE_PROVIDER_SITE_OTHER): Payer: Self-pay | Admitting: Ophthalmology

## 2020-02-24 ENCOUNTER — Ambulatory Visit (INDEPENDENT_AMBULATORY_CARE_PROVIDER_SITE_OTHER): Payer: Medicare Other | Admitting: Ophthalmology

## 2020-02-24 DIAGNOSIS — E113532 Type 2 diabetes mellitus with proliferative diabetic retinopathy with traction retinal detachment not involving the macula, left eye: Secondary | ICD-10-CM | POA: Insufficient documentation

## 2020-02-24 DIAGNOSIS — H35372 Puckering of macula, left eye: Secondary | ICD-10-CM

## 2020-02-24 DIAGNOSIS — Z794 Long term (current) use of insulin: Secondary | ICD-10-CM | POA: Insufficient documentation

## 2020-02-24 DIAGNOSIS — Z09 Encounter for follow-up examination after completed treatment for conditions other than malignant neoplasm: Secondary | ICD-10-CM | POA: Insufficient documentation

## 2020-02-24 NOTE — Progress Notes (Signed)
02/24/2020     CHIEF COMPLAINT Patient presents for Post-op Follow-up   HISTORY OF PRESENT ILLNESS: [name] is a 54 y.o. female who presents to the clinic today for:   HPI    Post-op Follow-up    In left eye.  Discomfort includes itching.  Negative for pain, foreign body sensation, tearing, discharge and floaters.  Vision is stable.  I, the attending physician,  performed the HPI with the patient and updated documentation appropriately.          Comments    1 Day s\p OS PPV/MP/SO REMOVAL  Pt denies pain. Pt states OS feels itchy and "crusty".       Last edited by Tilda Franco on 02/24/2020  8:05 AM. (History)      Referring physician: Frazier Richards, [place],   [id number]  HISTORICAL INFORMATION:   Selected notes from the MEDICAL RECORD NUMBER    Lab Results  Component Value Date   HGBA1C 8.4 (H) 03/24/2018     CURRENT MEDICATIONS: No current outpatient medications on file. (Ophthalmic Drugs)   No current facility-administered medications for this visit. (Ophthalmic Drugs)   Current Outpatient Medications (Other)  Medication Sig  . albuterol (PROVENTIL) 2 MG tablet Take 2 mg by mouth 3 (three) times daily as needed for shortness of breath.   Marland Kitchen amitriptyline (ELAVIL) 25 MG tablet TAKE 1 TABLET BY MOUTH AT BEDTIME FOR LEG PAIN  . cetirizine (ZYRTEC) 10 MG tablet Take 10 mg by mouth daily.   . diphenoxylate-atropine (LOMOTIL) 2.5-0.025 MG tablet Take 1 tablet by mouth 4 (four) times daily as needed for diarrhea or loose stools.  . ferrous sulfate 325 (65 FE) MG tablet Take 1 tablet (325 mg total) by mouth 2 (two) times daily with a meal.  . furosemide (LASIX) 40 MG tablet Take 1 tablet (40 mg total) by mouth daily for 30 days.  Marland Kitchen HYDROcodone-acetaminophen (NORCO) 10-325 MG tablet LIMIT 1 2 TO 1 (ONE HALF TO ONE) TABLET BY MOUTH PER DAY OR 2 4 TIMES DAILY IF TOLERATED. NO OXYCODONE.  Marland Kitchen insulin glargine (LANTUS) 100 UNIT/ML injection  Inject 0.18 mLs (18 Units total) into the skin 2 (two) times daily.  . insulin lispro (HUMALOG) 100 UNIT/ML injection For glucose 121-150 use TWO units,  for 151-200 use FOUR units, for 201-250 use SIX units, for 251-300 use EIGHT units, for 301-350 use TEN units. For blood sugars greater than 350 call MD.  . loperamide (IMODIUM A-D) 2 MG tablet Take 12 mg by mouth daily as needed for diarrhea or loose stools.  . metoprolol tartrate (LOPRESSOR) 25 MG tablet Take 0.5 tablets (12.5 mg total) by mouth 2 (two) times daily.  . nitroGLYCERIN (NITROSTAT) 0.4 MG SL tablet Place 1 tablet (0.4 mg total) under the tongue every 5 (five) minutes as needed for chest pain.  Marland Kitchen omeprazole (PRILOSEC) 20 MG capsule Take 20 mg by mouth daily.  . ondansetron (ZOFRAN-ODT) 4 MG disintegrating tablet DISSOLVE 1 TABLET IN MOUTH EVERY 8 HOURS AS NEEDED FOR NAUSEA OR VOMITING  . pregabalin (LYRICA) 50 MG capsule Take 1 capsule (50 mg total) by mouth 2 (two) times daily.  . Rivaroxaban (XARELTO) 15 MG TABS tablet Take 1 tablet (15 mg total) by mouth daily with supper.  . rosuvastatin (CRESTOR) 20 MG tablet Take 1 tablet (20 mg total) by mouth daily at 6 PM.   No current facility-administered medications for this visit. (Other)  REVIEW OF SYSTEMS: ROS    Positive for: Cardiovascular   Last edited by Tilda Franco on 02/24/2020  8:05 AM. (History)       ALLERGIES Allergies  Allergen Reactions  . Lactose Intolerance (Gi)     PAST MEDICAL HISTORY Past Medical History:  Diagnosis Date  . Abnormal stress test    a. 02/2017 MV: large region of fixed perfusion defect in basal to mid inf, mid-dist inflat walls, EF 43%. No ischemia (EF 55-60% by f/u echo).  . Arthritis   . Asthma   . Carotid arterial disease ([place])    a. 09/2017 Carotid U/S: 40-49% bilat ICA stenosis.  . Chronic back pain   . Coronary artery calcification seen on CT scan    a. 11/2017 CT Abd/Pelvis: Multi vessel coronary vascular Ca2+.  .  Depression   . Diabetes mellitus   . Diabetic neuropathy ([place])   . Difficult intubation    DIFFICULT AIRWAY/FYI  . Family history of adverse reaction to anesthesia    mother had difficlty waking   . Femoral-popliteal bypass graft occlusion, left ([place]) 12/02/2017  . GERD (gastroesophageal reflux disease)   . History of echocardiogram    a. 03/2017 Echo: EF 55-60%, mild LVH, nl RV fxn.  . Hyperlipidemia   . Ischemic cardiomyopathy    a. 04/2018 Echo: EF 30-35%, Gr2 DD, mild LVH. Mild MR. Mildly dil LA, mod dil RV w/ mod red RV fxn, mild TR, PASP 63mmHg.  . NSTEMI (non-ST elevated myocardial infarction) ([place])    a. 05/2018 in setting of Afib, sepsis, and post-op L AKA. Peak trop 7.7. EF 30-35% by echo-->cath not performed 2/2 renal failure.  . 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 PAD (peripheral artery disease) ([place])    a. S/p L fem-pop bypass; b. 11/2017 s/p Aortobifem bypass 2/2 graft occlusion; c. 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  . Paroxysmal atrial fibrillation with rapid ventricular response ([place]) 12/02/2017   a. CHA2DS2VASc = 3-->Xarelto; b. 05/2018 Recurrent Afib-->amio.  Marland Kitchen Ulcer    Foot   Past Surgical History:  Procedure Laterality Date  . ABDOMINAL AORTAGRAM  June 15, 2014  . ABDOMINAL AORTAGRAM N/A 06/15/2014   Procedure: ABDOMINAL Maxcine Ham;  Surgeon: Serafina Mitchell, MD;  Location: [facility] CATH LAB;  Service: Cardiovascular;  Laterality: N/A;  . ABDOMINAL AORTAGRAM N/A 11/22/2014   Procedure: ABDOMINAL AORTAGRAM;  Surgeon: Serafina Mitchell, MD;  Location: [facility] CATH LAB;  Service: Cardiovascular;  Laterality: N/A;  . ABDOMINAL AORTOGRAM W/LOWER EXTREMITY N/A 01/07/2017   Procedure: Abdominal Aortogram w/Lower Extremity;  Surgeon: Serafina Mitchell, MD;  Location: [place] CV LAB;  Service: Cardiovascular;  Laterality: N/A;  . ABDOMINAL AORTOGRAM W/LOWER EXTREMITY N/A 10/31/2017   Procedure: ABDOMINAL AORTOGRAM W/LOWER EXTREMITY;  Surgeon:  Elam Dutch, MD;  Location: [place] CV LAB;  Service: Cardiovascular;  Laterality: N/A;  . ABDOMINAL AORTOGRAM W/LOWER EXTREMITY N/A 03/24/2018   Procedure: ABDOMINAL AORTOGRAM W/LOWER EXTREMITY;  Surgeon: Serafina Mitchell, MD;  Location: [place] CV LAB;  Service: Cardiovascular;  Laterality: N/A;  . AMPUTATION Left 04/26/2018   Procedure: AMPUTATION ABOVE KNEE;  Surgeon: Elam Dutch, MD;  Location: [place];  Service: Vascular;  Laterality: Left;  . AORTA - BILATERAL FEMORAL ARTERY BYPASS GRAFT N/A 11/28/2017   Procedure: AORTA BIFEMORAL BYPASS USING HEMASHIELD GOLD GRAFT & REIMPLANT IMA;  Surgeon: Serafina Mitchell, MD;  [facility];  Service: Vascular;  Laterality: N/A;  .  AORTIC ARCH ANGIOGRAPHY N/A 10/31/2017   Procedure: AORTIC ARCH ANGIOGRAPHY;  Surgeon: Elam Dutch, MD;  Location: [place] CV LAB;  Service: Cardiovascular;  Laterality: N/A;  . APPLICATION OF WOUND VAC  11/28/2017   Procedure: APPLICATION OF WOUND VAC;  Surgeon: Serafina Mitchell, MD;  Location: [facility] OR;  Service: Vascular;;  . APPLICATION OF WOUND VAC Left 03/27/2018   Procedure: APPLICATION OF WOUND VAC LEFT GROIN USING PREVENA PLUS;  Surgeon: Serafina Mitchell, MD;  Location: [place];  Service: Vascular;  Laterality: Left;  . ARTERIAL BYPASS SURGERY   07/05/2010   Right Common Femoral to below knee popliteal BPG  . BACK SURGERY     X's  2  . CARDIAC CATHETERIZATION    . CHOLECYSTECTOMY     Gall Bladder  . CYSTECTOMY Left    wrist  . EMBOLECTOMY Left 11/28/2017   Procedure: Left Lower Extremity Embolectomy, Left Tibial Peroneal Trunk Endarterectomy with Patch Angioplasty; Vein Harvest Small Saphenous Graft Left Lower Leg;  Surgeon: [name], MD;  Location: [place];  Service: Vascular;  Laterality: Left;  . EYE SURGERY Left 02/23/2020   Dr. Zadie Rhine  . FEMORAL-POPLITEAL BYPASS GRAFT Left 03/27/2018   Procedure: THROMBECTOMY OF LEFT FEMORAL TIBIAL BYPASS;  Surgeon: Serafina Mitchell, MD;   Location: [place];  Service: Vascular;  Laterality: Left;  . FEMORAL-TIBIAL BYPASS GRAFT Left 03/27/2018   Procedure: BYPASS GRAFT FEMORAL-TIBIAL ARTERY LEFT REDO USING CRYOPRESERVED SAPHENOUS VEIN 70cm;  Surgeon: Serafina Mitchell, MD;  Location[facility] OR;  Service: Vascular;  Laterality: Left;  . INTERCOSTAL NERVE BLOCK  November 2015  . INTRAOPERATIVE ARTERIOGRAM  11/28/2017   Procedure: INTRA OPERATIVE ARTERIOGRAM OF LEFT LEG;  Surgeon: Serafina Mitchell, MD;  Location: [place];  Service: Vascular;;  . INTRAOPERATIVE ARTERIOGRAM Left 03/27/2018   Procedure: INTRA OPERATIVE ARTERIOGRAM TIMES TWO;  Surgeon: Serafina Mitchell, MD;  Location: [place];  Service: Vascular;  Laterality: Left;  . IR FLUORO GUIDE CV LINE RIGHT  03/20/2017  . IR FLUORO GUIDE CV LINE RIGHT  05/05/2018  . IR REMOVAL TUN CV CATH W/O FL  05/20/2018  . IR US GUIDE VASC ACCESS RIGHT  03/20/2017  . IR US GUIDE VASC ACCESS RIGHT  05/05/2018  . IRRIGATION AND DEBRIDEMENT BUTTOCKS Right 09/30/2016   Procedure: DEBRIDEMENT RIGHT  BUTTOCK WOUND;  Surgeon: Georganna Skeans, MD;  Location: [place];  Service: General;  Laterality: Right;  . left foot surgery    . PERIPHERAL VASCULAR CATHETERIZATION N/A 05/07/2016   Procedure: Abdominal Aortogram;  Surgeon: Serafina Mitchell, MD;  Location: [place] CV LAB;  Service: Cardiovascular;  Laterality: N/A;  . PERIPHERAL VASCULAR CATHETERIZATION N/A 05/07/2016   Procedure: Lower Extremity Angiography;  Surgeon: Serafina Mitchell, MD;  Location: [place] CV LAB;  Service: Cardiovascular;  Laterality: N/A;  . PERIPHERAL VASCULAR CATHETERIZATION N/A 05/07/2016   Procedure: Aortic Arch Angiography;  Surgeon: Serafina Mitchell, MD;  Location: [place] CV LAB;  Service: Cardiovascular;  Laterality: N/A;  . PERIPHERAL VASCULAR CATHETERIZATION N/A 05/07/2016   Procedure: Upper Extremity Angiography;  Surgeon: Serafina Mitchell, MD;  Location: [place] CV LAB;  Service: Cardiovascular;  Laterality: N/A;  . PERIPHERAL  VASCULAR CATHETERIZATION Right 05/07/2016   Procedure: Peripheral Vascular Balloon Angioplasty;  Surgeon: Serafina Mitchell, MD;  Location: [place] CV LAB;  Service: Cardiovascular;  Laterality: Right;  subclavian  . PERIPHERAL VASCULAR CATHETERIZATION Right 05/07/2016   Procedure: Peripheral Vascular Intervention;  Surgeon: Serafina Mitchell,  MD;  Location: [place] CV LAB;  Service: Cardiovascular;  Laterality: Right;  External  Iliac  . SKIN GRAFT Right 2012   [name] by Dr. Nils Pyle- Right and Left Ankle  . SPINE SURGERY    . THROMBECTOMY FEMORAL ARTERY  11/28/2017   Procedure: THROMBECTOMY  & REVISION OF BILATERAL FEMORAL TO POPLETEAL ARTERIES;  Surgeon: Serafina Mitchell, MD;  Location: [facility] OR;  Service: Vascular;;  . TONSILLECTOMY      FAMILY HISTORY Family History  Problem Relation Age of Onset  . Coronary artery disease Mother   . Peripheral vascular disease Mother   . Heart disease Mother        Before age 32  . Other Mother        Venous insuffiency  . Diabetes Mother   . Hyperlipidemia Mother   . Hypertension Mother   . Varicose Veins Mother   . Heart attack Mother        before age 37  . Heart disease Father   . Diabetes Father   . Diabetes Maternal Grandmother   . Diabetes Paternal Grandmother   . Diabetes Paternal Grandfather   . Diabetes Sister   . Hypertension Sister   . Diabetes Brother   . Hypertension Brother     SOCIAL HISTORY Social History   Tobacco Use  . Smoking status: Current Some Day Smoker    Packs/day: 1.00    Years: 30.00    Pack years: 30.00    Types: Cigarettes    Last attempt to quit: 02/07/2017    Years since quitting: 3.0  . Smokeless tobacco: Never Used  Substance Use Topics  . Alcohol use: No    Alcohol/week: 0.0 standard drinks  . Drug use: No         OPHTHALMIC EXAM:  Base Eye Exam    Visual Acuity (Snellen - Linear)      Right Left   Dist [place] NLP HM       Tonometry (Tonopen, 8:15 AM)      Right Left   Pressure 49 9         Pupils      Dark Light Shape   Right      Left 7 6 Round       Visual Fields (Counting fingers)      Left Right   Restrictions Total superior temporal, inferior temporal, superior nasal, inferior nasal deficiencies Total superior temporal, inferior temporal, superior nasal, inferior nasal deficiencies       Neuro/Psych    Oriented x3: Yes   Mood/Affect: Normal        Slit Lamp and Fundus Exam    External Exam      Right Left   External  Normal       Slit Lamp Exam      Right Left   Lids/Lashes  Ecchymosis, Lid thickening   Conjunctiva/Sclera  1+ Chemosis, 1+ Subconjunctival hemorrhage, 1+ Injection   Cornea  Clear   Anterior Chamber  Deep and quiet   Iris  Round and reactive   Lens  Posterior chamber intraocular lens   Vitreous  vitrectomized, no oil       Fundus Exam      Right Left   Disc  perfused, pink   C/D Ratio  0.1   Macula  ,, no erm,,, attached   Vessels  . Good PRP, with no diabetic retinopathy quiesced sent.   Periphery  PRP  IMAGING AND PROCEDURES  Imaging and Procedures for 02/24/20           ASSESSMENT/PLAN:    ICD-10-CM   1. Follow-up examination after eye surgery  Z09   2. 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 Macular pucker, left eye  H35.372     1.  2.  3.  Ophthalmic Meds Ordered this visit:  No orders of the defined types were placed in this encounter.      Return in about 1 week (around 03/02/2020) for OCT, OS.  Patient Instructions  No lifting and bending for 1 week. No water in the eye for 10 days. Do not rub the eye. Wear shield at night for 1-3 days.  Wear your CPAP as normal, if instructed by your doctor.  Continue your topical medications for a total of 3 weeks.  Do not refill your postoperative medications unless  instructed.    Explained the diagnoses, plan, and follow up with the patient and they expressed understanding.  Patient expressed understanding of the importance of proper follow up care.   Clent Demark Grisel Blumenstock M.D. Diseases & Surgery of the Retina and Vitreous Retina & Diabetic [place] 02/24/20     Abbreviations: M myopia (nearsighted); A astigmatism; H hyperopia (farsighted); P presbyopia; Mrx spectacle prescription;  CTL contact lenses; OD right eye; OS left eye; OU both eyes  XT exotropia; ET esotropia; PEK punctate epithelial keratitis; PEE punctate epithelial erosions; DES dry eye syndrome; MGD meibomian gland dysfunction; ATs artificial tears; PFAT's preservative free artificial tears; [place] nuclear sclerotic cataract; PSC posterior subcapsular cataract; ERM epi-retinal membrane; PVD posterior vitreous detachment; RD retinal detachment; DM diabetes mellitus; DR diabetic retinopathy; NPDR non-proliferative diabetic retinopathy; PDR proliferative diabetic retinopathy; CSME clinically significant macular edema; DME diabetic macular edema; dbh dot blot hemorrhages; CWS cotton wool spot; POAG primary open angle glaucoma; C/D cup-to-disc ratio; HVF humphrey visual field; GVF goldmann visual field; OCT optical coherence tomography; IOP intraocular pressure; BRVO Branch retinal vein occlusion; CRVO central retinal vein occlusion; CRAO central retinal artery occlusion; BRAO branch retinal artery occlusion; RT retinal tear; SB scleral buckle; PPV pars plana vitrectomy; VH Vitreous hemorrhage; PRP panretinal laser photocoagulation; IVK intravitreal kenalog; VMT vitreomacular traction; MH Macular hole;  NVD neovascularization of the disc; NVE neovascularization elsewhere; AREDS age related eye disease study; ARMD age related macular degeneration; POAG primary open angle glaucoma; EBMD epithelial/anterior basement membrane dystrophy; ACIOL anterior chamber intraocular lens; IOL intraocular lens; PCIOL posterior  chamber intraocular lens; Phaco/IOL phacoemulsification with intraocular lens placement; [place] photorefractive keratectomy; LASIK laser assisted in situ keratomileusis; HTN hypertension; DM diabetes mellitus; COPD chronic obstructive pulmonary disease

## 2020-02-24 NOTE — Patient Instructions (Signed)
No lifting and bending for 1 week. No water in the eye for 10 days. Do not rub the eye. Wear shield at night for 1-3 days.  Wear your CPAP as normal, if instructed by your doctor.  Continue your topical medications for a total of 3 weeks.  Do not refill your postoperative medications unless instructed.   

## 2020-03-02 ENCOUNTER — Ambulatory Visit (INDEPENDENT_AMBULATORY_CARE_PROVIDER_SITE_OTHER): Payer: Medicare Other | Admitting: Ophthalmology

## 2020-03-02 ENCOUNTER — Other Ambulatory Visit: Payer: Self-pay

## 2020-03-02 ENCOUNTER — Encounter (INDEPENDENT_AMBULATORY_CARE_PROVIDER_SITE_OTHER): Payer: Self-pay | Admitting: Ophthalmology

## 2020-03-02 DIAGNOSIS — E113592 Type 2 diabetes mellitus with proliferative diabetic retinopathy without macular edema, left eye: Secondary | ICD-10-CM

## 2020-03-02 NOTE — Progress Notes (Signed)
03/02/2020     CHIEF COMPLAINT Patient presents for No chief complaint on file.   HISTORY OF PRESENT ILLNESS: [name] is a 54 y.o. female who presents to the clinic today for:   HPI    1 week PO OS PPV/MP/SO REMOVAL Patient states that her eye is doing good and overall has no complaints.   Last edited by Gerda Diss, Elizabeth on 03/02/2020  2:26 PM. (History)      Referring physician: Frazier Richards, MD [name],  [place] [address]  HISTORICAL INFORMATION:   Selected notes from the MEDICAL RECORD NUMBER    Lab Results  Component Value Date   HGBA1C 8.4 (H) 03/24/2018     CURRENT MEDICATIONS: No current outpatient medications on file. (Ophthalmic Drugs)   No current facility-administered medications for this visit. (Ophthalmic Drugs)   Current Outpatient Medications (Other)  Medication Sig  . albuterol (PROVENTIL) 2 MG tablet Take 2 mg by mouth 3 (three) times daily as needed for shortness of breath.   Marland Kitchen amitriptyline (ELAVIL) 25 MG tablet TAKE 1 TABLET BY MOUTH AT BEDTIME FOR LEG PAIN  . cetirizine (ZYRTEC) 10 MG tablet Take 10 mg by mouth daily.   . diphenoxylate-atropine (LOMOTIL) 2.5-0.025 MG tablet Take 1 tablet by mouth 4 (four) times daily as needed for diarrhea or loose stools.  Marland Kitchen HYDROcodone-acetaminophen (NORCO) 10-325 MG tablet LIMIT 1 2 TO 1 (ONE HALF TO ONE) TABLET BY MOUTH PER DAY OR 2 4 TIMES DAILY IF TOLERATED. NO OXYCODONE.  Marland Kitchen insulin glargine (LANTUS) 100 UNIT/ML injection Inject 0.18 mLs (18 Units total) into the skin 2 (two) times daily.  . insulin lispro (HUMALOG) 100 UNIT/ML injection For glucose 121-150 use TWO units,  for 151-200 use FOUR units, for 201-250 use SIX units, for 251-300 use EIGHT units, for 301-350 use TEN units. For blood sugars greater than 350 call MD.  . loperamide (IMODIUM A-D) 2 MG tablet Take 12 mg by mouth daily as needed for diarrhea or loose stools.  . metoprolol tartrate (LOPRESSOR) 25 MG tablet Take 0.5  tablets (12.5 mg total) by mouth 2 (two) times daily.  . nitroGLYCERIN (NITROSTAT) 0.4 MG SL tablet Place 1 tablet (0.4 mg total) under the tongue every 5 (five) minutes as needed for chest pain.  Marland Kitchen omeprazole (PRILOSEC) 20 MG capsule Take 20 mg by mouth daily.  . ondansetron (ZOFRAN-ODT) 4 MG disintegrating tablet DISSOLVE 1 TABLET IN MOUTH EVERY 8 HOURS AS NEEDED FOR NAUSEA OR VOMITING  . pregabalin (LYRICA) 50 MG capsule Take 1 capsule (50 mg total) by mouth 2 (two) times daily.  . Rivaroxaban (XARELTO) 15 MG TABS tablet Take 1 tablet (15 mg total) by mouth daily with supper.  . rosuvastatin (CRESTOR) 20 MG tablet Take 1 tablet (20 mg total) by mouth daily at 6 PM.  . ferrous sulfate 325 (65 FE) MG tablet Take 1 tablet (325 mg total) by mouth 2 (two) times daily with a meal.  . furosemide (LASIX) 40 MG tablet Take 1 tablet (40 mg total) by mouth daily for 30 days.   No current facility-administered medications for this visit. (Other)      REVIEW OF SYSTEMS:    ALLERGIES Allergies  Allergen Reactions  . Lactose Intolerance (Gi)     PAST MEDICAL HISTORY Past Medical History:  Diagnosis Date  . Abnormal stress test    a. 02/2017 MV: large region of fixed perfusion defect in basal to mid inf, mid-dist inflat walls,  EF 43%. No ischemia (EF 55-60% by f/u echo).  . Arthritis   . Asthma   . Carotid arterial disease ([place])    a. 09/2017 Carotid U/S: 40-49% bilat ICA stenosis.  . Chronic back pain   . Coronary artery calcification seen on CT scan    a. 11/2017 CT Abd/Pelvis: Multi vessel coronary vascular Ca2+.  . Depression   . Diabetes mellitus   . Diabetic neuropathy ([place])   . Difficult intubation    DIFFICULT AIRWAY/FYI  . Family history of adverse reaction to anesthesia    mother had difficlty waking   . Femoral-popliteal bypass graft occlusion, left ([place]) 12/02/2017  . GERD (gastroesophageal reflux disease)   . History of echocardiogram    a. 03/2017 Echo: EF 55-60%, mild  LVH, nl RV fxn.  . Hyperlipidemia   . Ischemic cardiomyopathy    a. 04/2018 Echo: EF 30-35%, Gr2 DD, mild LVH. Mild MR. Mildly dil LA, mod dil RV w/ mod red RV fxn, mild TR, PASP 58mmHg.  . NSTEMI (non-ST elevated myocardial infarction) ([place])    a. 05/2018 in setting of Afib, sepsis, and post-op L AKA. Peak trop 7.7. EF 30-35% by echo-->cath not performed 2/2 renal failure.  . 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 PAD (peripheral artery disease) ([place])    a. S/p L fem-pop bypass; b. 11/2017 s/p Aortobifem bypass 2/2 graft occlusion; c. 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  . Paroxysmal atrial fibrillation with rapid ventricular response ([place]) 12/02/2017   a. CHA2DS2VASc = 3-->Xarelto; b. 05/2018 Recurrent Afib-->amio.  Marland Kitchen Ulcer    Foot   Past Surgical History:  Procedure Laterality Date  . ABDOMINAL AORTAGRAM  June 15, 2014  . ABDOMINAL AORTAGRAM N/A 06/15/2014   Procedure: ABDOMINAL Maxcine Ham;  Surgeon: Serafina Mitchell, MD;  Location: [facility] CATH LAB;  Service: Cardiovascular;  Laterality: N/A;  . ABDOMINAL AORTAGRAM N/A 11/22/2014   Procedure: ABDOMINAL AORTAGRAM;  Surgeon: Serafina Mitchell, MD;  Location: [facility] CATH LAB;  Service: Cardiovascular;  Laterality: N/A;  . ABDOMINAL AORTOGRAM W/LOWER EXTREMITY N/A 01/07/2017   Procedure: Abdominal Aortogram w/Lower Extremity;  Surgeon: Serafina Mitchell, MD;  Location: [place] CV LAB;  Service: Cardiovascular;  Laterality: N/A;  . ABDOMINAL AORTOGRAM W/LOWER EXTREMITY N/A 10/31/2017   Procedure: ABDOMINAL AORTOGRAM W/LOWER EXTREMITY;  Surgeon: Elam Dutch, MD;  Location: [place] CV LAB;  Service: Cardiovascular;  Laterality: N/A;  . ABDOMINAL AORTOGRAM W/LOWER EXTREMITY N/A 03/24/2018   Procedure: ABDOMINAL AORTOGRAM W/LOWER EXTREMITY;  Surgeon: Serafina Mitchell, MD;  Location: [place] CV LAB;  Service: Cardiovascular;  Laterality: N/A;  . AMPUTATION Left 04/26/2018   Procedure: AMPUTATION ABOVE KNEE;   Surgeon: Elam Dutch, MD;  Location: [place];  Service: Vascular;  Laterality: Left;  . AORTA - BILATERAL FEMORAL ARTERY BYPASS GRAFT N/A 11/28/2017   Procedure: AORTA BIFEMORAL BYPASS USING HEMASHIELD GOLD GRAFT & REIMPLANT IMA;  Surgeon: Serafina Mitchell, MD;  Location: [facility] OR;  Service: Vascular;  Laterality: N/A;  . AORTIC ARCH ANGIOGRAPHY N/A 10/31/2017   Procedure: AORTIC ARCH ANGIOGRAPHY;  Surgeon: Elam Dutch, MD;  Location: [place] CV LAB;  Service: Cardiovascular;  Laterality: N/A;  . APPLICATION OF WOUND VAC  11/28/2017   Procedure: APPLICATION OF WOUND VAC;  Surgeon: Serafina Mitchell, MD;  Location: [facility] OR;  Service: Vascular;;  . APPLICATION OF WOUND VAC Left 03/27/2018   Procedure: APPLICATION OF WOUND VAC LEFT GROIN USING PREVENA PLUS;  Surgeon: Serafina Mitchell, MD;  Location: [facility] OR;  Service: Vascular;  Laterality: Left;  . ARTERIAL BYPASS SURGERY   07/05/2010   Right Common Femoral to below knee popliteal BPG  . BACK SURGERY     X's  2  . CARDIAC CATHETERIZATION    . CHOLECYSTECTOMY     Gall Bladder  . CYSTECTOMY Left    wrist  . EMBOLECTOMY Left 11/28/2017   Procedure: Left Lower Extremity Embolectomy, Left Tibial Peroneal Trunk Endarterectomy with Patch Angioplasty; Vein Harvest Small Saphenous Graft Left Lower Leg;  Surgeon: [name], MD;  Location: [place];  Service: Vascular;  Laterality: Left;  . EYE SURGERY Left 02/23/2020   Dr. Zadie Rhine  . FEMORAL-POPLITEAL BYPASS GRAFT Left 03/27/2018   Procedure: THROMBECTOMY OF LEFT FEMORAL TIBIAL BYPASS;  Surgeon: Serafina Mitchell, MD;  Location: [place];  Service: Vascular;  Laterality: Left;  . FEMORAL-TIBIAL BYPASS GRAFT Left 03/27/2018   Procedure: BYPASS GRAFT FEMORAL-TIBIAL ARTERY LEFT REDO USING CRYOPRESERVED SAPHENOUS VEIN 70cm;  Surgeon: Serafina Mitchell, MD;  Location: [facility] OR;  Service: Vascular;  Laterality: Left;  . INTERCOSTAL NERVE BLOCK  November 2015  . INTRAOPERATIVE ARTERIOGRAM  11/28/2017    Procedure: INTRA OPERATIVE ARTERIOGRAM OF LEFT LEG;  Surgeon: Serafina Mitchell, MD;  Location: [place];  Service: Vascular;;  . INTRAOPERATIVE ARTERIOGRAM Left 03/27/2018   Procedure: INTRA OPERATIVE ARTERIOGRAM TIMES TWO;  Surgeon: Serafina Mitchell, MD;  Location: [facility];  Service: Vascular;  Laterality: Left;  . IR FLUORO GUIDE CV LINE RIGHT  03/20/2017  . IR FLUORO GUIDE CV LINE RIGHT  05/05/2018  . IR REMOVAL TUN CV CATH W/O FL  05/20/2018  . IR US GUIDE VASC ACCESS RIGHT  03/20/2017  . IR US GUIDE VASC ACCESS RIGHT  05/05/2018  . IRRIGATION AND DEBRIDEMENT BUTTOCKS Right 09/30/2016   Procedure: DEBRIDEMENT RIGHT  BUTTOCK WOUND;  Surgeon: Georganna Skeans, MD;  Location: [place];  Service: General;  Laterality: Right;  . left foot surgery    . PERIPHERAL VASCULAR CATHETERIZATION N/A 05/07/2016   Procedure: Abdominal Aortogram;  Surgeon: Serafina Mitchell, MD;  Location: [place] CV LAB;  Service: Cardiovascular;  Laterality: N/A;  . PERIPHERAL VASCULAR CATHETERIZATION N/A 05/07/2016   Procedure: Lower Extremity Angiography;  Surgeon: Serafina Mitchell, MD;  Location: [place] CV LAB;  Service: Cardiovascular;  Laterality: N/A;  . PERIPHERAL VASCULAR CATHETERIZATION N/A 05/07/2016   Procedure: Aortic Arch Angiography;  Surgeon: Serafina Mitchell, MD;  Location: [place] CV LAB;  Service: Cardiovascular;  Laterality: N/A;  . PERIPHERAL VASCULAR CATHETERIZATION N/A 05/07/2016   Procedure: Upper Extremity Angiography;  Surgeon: Serafina Mitchell, MD;  Location: [place] CV LAB;  Service: Cardiovascular;  Laterality: N/A;  . PERIPHERAL VASCULAR CATHETERIZATION Right 05/07/2016   Procedure: Peripheral Vascular Balloon Angioplasty;  Surgeon: Serafina Mitchell, MD;  Location: [place] CV LAB;  Service: Cardiovascular;  Laterality: Right;  subclavian  . PERIPHERAL VASCULAR CATHETERIZATION Right 05/07/2016   Procedure: Peripheral Vascular Intervention;  Surgeon: Serafina Mitchell, MD;  Location: [place] CV LAB;   Service: Cardiovascular;  Laterality: Right;  External  Iliac  . SKIN GRAFT Right 2012   [name] by Dr. Nils Pyle- Right and Left Ankle  . SPINE SURGERY    . THROMBECTOMY FEMORAL ARTERY  11/28/2017   Procedure: THROMBECTOMY  & REVISION OF BILATERAL FEMORAL TO POPLETEAL ARTERIES;  Surgeon: Serafina Mitchell, MD;  Location: [place];  Service: Vascular;;  . TONSILLECTOMY  FAMILY HISTORY Family History  Problem Relation Age of Onset  . Coronary artery disease Mother   . Peripheral vascular disease Mother   . Heart disease Mother        Before age 55  . Other Mother        Venous insuffiency  . Diabetes Mother   . Hyperlipidemia Mother   . Hypertension Mother   . Varicose Veins Mother   . Heart attack Mother        before age 61  . Heart disease Father   . Diabetes Father   . Diabetes Maternal Grandmother   . Diabetes Paternal Grandmother   . Diabetes Paternal Grandfather   . Diabetes Sister   . Hypertension Sister   . Diabetes Brother   . Hypertension Brother     SOCIAL HISTORY Social History   Tobacco Use  . Smoking status: Current Some Day Smoker    Packs/day: 1.00    Years: 30.00    Pack years: 30.00    Types: Cigarettes    Last attempt to quit: 02/07/2017    Years since quitting: 3.0  . Smokeless tobacco: Never Used  Substance Use Topics  . Alcohol use: No    Alcohol/week: 0.0 standard drinks  . Drug use: No         OPHTHALMIC EXAM:  Base Eye Exam    Visual Acuity (Snellen - Linear)      Right Left   Dist [place] NLP CF @ 4'   Dist ph [place] NI NI       Tonometry (Tonopen, 2:30 PM)      Right Left   Pressure 30 15       Dilation    Left eye: 1.0% Mydriacyl, 2.5% Phenylephrine @ 2:30 PM        Slit Lamp and Fundus Exam    External Exam      Right Left   External Normal Normal       Slit Lamp Exam      Right Left   Lids/Lashes Normal Normal   Conjunctiva/Sclera White and quiet White and quiet   Cornea Clear Clear   Anterior Chamber Deep and quiet  Deep and quiet   Iris Round and reactive Round and reactive   Lens Clear Posterior chamber intraocular lens   Vitreous Normal Normal          IMAGING AND PROCEDURES  Imaging and Procedures for 03/02/20  OCT, Retina - OU - Both Eyes       Left Eye Central Foveal Thickness: 422. Findings include abnormal foveal contour, macular pucker, central retinal atrophy, inner retinal atrophy.   Notes OD no view  OS with intrinsic retinal atrophy. Much less  topographic distortion overall status post vitrectomy membrane peel. Clear media                ASSESSMENT/PLAN:  No problem-specific Assessment & Plan notes found for this encounter.      ICD-10-CM   1. 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 OS, vastly improved after vitrectomy, membrane peel, removal of silicone oil for prior traction retinal detachment complex. Visual acuity has improved.  2. Patient is instructed to discontinue drops when current bottle is complete and do not refill.  3.  Ophthalmic Meds Ordered this visit:  No orders of the defined types were placed in this encounter.  No follow-ups on file.  There are no Patient Instructions on file for this visit.   Explained the diagnoses, plan, and follow up with the patient and they expressed understanding.  Patient expressed understanding of the importance of proper follow up care.   Clent Demark Lady Wisham M.D. Diseases & Surgery of the Retina and Vitreous Retina & Diabetic [place] 03/02/20     Abbreviations: M myopia (nearsighted); A astigmatism; H hyperopia (farsighted); P presbyopia; Mrx spectacle prescription;  CTL contact lenses; OD right eye; OS left eye; OU both eyes  XT exotropia; ET esotropia; PEK punctate epithelial keratitis; PEE punctate epithelial erosions; DES dry eye syndrome; MGD meibomian gland dysfunction;  ATs artificial tears; PFAT's preservative free artificial tears; [place] nuclear sclerotic cataract; PSC posterior subcapsular cataract; ERM epi-retinal membrane; PVD posterior vitreous detachment; RD retinal detachment; DM diabetes mellitus; DR diabetic retinopathy; NPDR non-proliferative diabetic retinopathy; PDR proliferative diabetic retinopathy; CSME clinically significant macular edema; DME diabetic macular edema; dbh dot blot hemorrhages; CWS cotton wool spot; POAG primary open angle glaucoma; C/D cup-to-disc ratio; HVF humphrey visual field; GVF goldmann visual field; OCT optical coherence tomography; IOP intraocular pressure; BRVO Branch retinal vein occlusion; CRVO central retinal vein occlusion; CRAO central retinal artery occlusion; BRAO branch retinal artery occlusion; RT retinal tear; SB scleral buckle; PPV pars plana vitrectomy; VH Vitreous hemorrhage; PRP panretinal laser photocoagulation; IVK intravitreal kenalog; VMT vitreomacular traction; MH Macular hole;  NVD neovascularization of the disc; NVE neovascularization elsewhere; AREDS age related eye disease study; ARMD age related macular degeneration; POAG primary open angle glaucoma; EBMD epithelial/anterior basement membrane dystrophy; ACIOL anterior chamber intraocular lens; IOL intraocular lens; PCIOL posterior chamber intraocular lens; Phaco/IOL phacoemulsification with intraocular lens placement; [place] photorefractive keratectomy; LASIK laser assisted in situ keratomileusis; HTN hypertension; DM diabetes mellitus; COPD chronic obstructive pulmonary disease

## 2020-03-29 ENCOUNTER — Telehealth: Payer: Self-pay | Admitting: Internal Medicine

## 2020-03-29 NOTE — Telephone Encounter (Signed)
3 attempts to schedule fu appt from recall list.   Deleting recall.   

## 2020-04-10 ENCOUNTER — Encounter (INDEPENDENT_AMBULATORY_CARE_PROVIDER_SITE_OTHER): Payer: Self-pay | Admitting: Ophthalmology

## 2020-04-10 ENCOUNTER — Other Ambulatory Visit: Payer: Self-pay

## 2020-04-10 ENCOUNTER — Ambulatory Visit (INDEPENDENT_AMBULATORY_CARE_PROVIDER_SITE_OTHER): Payer: Medicare Other | Admitting: Ophthalmology

## 2020-04-10 DIAGNOSIS — E113532 Type 2 diabetes mellitus with proliferative diabetic retinopathy with traction retinal detachment not involving the macula, left eye: Secondary | ICD-10-CM

## 2020-04-10 DIAGNOSIS — E113592 Type 2 diabetes mellitus with proliferative diabetic retinopathy without macular edema, left eye: Secondary | ICD-10-CM | POA: Diagnosis not present

## 2020-04-10 DIAGNOSIS — Z794 Long term (current) use of insulin: Secondary | ICD-10-CM

## 2020-04-10 NOTE — Assessment & Plan Note (Signed)
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  Vitrectomy and laser and silicone oil injection is now been followed by removal of silicone oil in the left eye.  The vitreous cavity is is clear.  The media is clear.  Longstanding damage to the fovea with retinal atrophy is not surgically approachable.  Patient does desire and wants to return to follow-up with Dr. Madelin Headings or for any chance that glasses, spectacle correction.  More likely, low vision aids would be appropriate.

## 2020-04-10 NOTE — Progress Notes (Signed)
04/10/2020     CHIEF COMPLAINT Patient presents for Retina Follow Up   HISTORY OF PRESENT ILLNESS: [name] is a 54 y.o. female who presents to the clinic today for:   HPI    Retina Follow Up    Patient presents with  Diabetic Retinopathy.  In left eye.  Duration of 5 weeks.  Since onset it is stable.          Comments    5 week follow up - FP OU Patient denies change in vision and overall has no complaints. LBS 165 yesterday A1C unknown       Last edited by Gerda Diss on 04/10/2020  3:38 PM. (History)      Referring physician: Frazier Richards, [place],  [place] [id number]  HISTORICAL INFORMATION:   Selected notes from the MEDICAL RECORD NUMBER    Lab Results  Component Value Date   HGBA1C 8.4 (H) 03/24/2018     CURRENT MEDICATIONS: No current outpatient medications on file. (Ophthalmic Drugs)   No current facility-administered medications for this visit. (Ophthalmic Drugs)   Current Outpatient Medications (Other)  Medication Sig  . albuterol (PROVENTIL) 2 MG tablet Take 2 mg by mouth 3 (three) times daily as needed for shortness of breath.   Marland Kitchen amitriptyline (ELAVIL) 25 MG tablet TAKE 1 TABLET BY MOUTH AT BEDTIME FOR LEG PAIN  . cetirizine (ZYRTEC) 10 MG tablet Take 10 mg by mouth daily.   . diphenoxylate-atropine (LOMOTIL) 2.5-0.025 MG tablet Take 1 tablet by mouth 4 (four) times daily as needed for diarrhea or loose stools.  . ferrous sulfate 325 (65 FE) MG tablet Take 1 tablet (325 mg total) by mouth 2 (two) times daily with a meal.  . furosemide (LASIX) 40 MG tablet Take 1 tablet (40 mg total) by mouth daily for 30 days.  Marland Kitchen HYDROcodone-acetaminophen (NORCO) 10-325 MG tablet LIMIT 1 2 TO 1 (ONE HALF TO ONE) TABLET BY MOUTH PER DAY OR 2 4 TIMES DAILY IF TOLERATED. NO OXYCODONE.  Marland Kitchen insulin glargine (LANTUS) 100 UNIT/ML injection Inject 0.18 mLs (18 Units total) into the skin 2 (two) times daily.  . insulin lispro (HUMALOG) 100 UNIT/ML  injection For glucose 121-150 use TWO units,  for 151-200 use FOUR units, for 201-250 use SIX units, for 251-300 use EIGHT units, for 301-350 use TEN units. For blood sugars greater than 350 call MD.  . loperamide (IMODIUM A-D) 2 MG tablet Take 12 mg by mouth daily as needed for diarrhea or loose stools.  . metoprolol tartrate (LOPRESSOR) 25 MG tablet Take 0.5 tablets (12.5 mg total) by mouth 2 (two) times daily.  . nitroGLYCERIN (NITROSTAT) 0.4 MG SL tablet Place 1 tablet (0.4 mg total) under the tongue every 5 (five) minutes as needed for chest pain.  Marland Kitchen omeprazole (PRILOSEC) 20 MG capsule Take 20 mg by mouth daily.  . ondansetron (ZOFRAN-ODT) 4 MG disintegrating tablet DISSOLVE 1 TABLET IN MOUTH EVERY 8 HOURS AS NEEDED FOR NAUSEA OR VOMITING  . pregabalin (LYRICA) 50 MG capsule Take 1 capsule (50 mg total) by mouth 2 (two) times daily.  . Rivaroxaban (XARELTO) 15 MG TABS tablet Take 1 tablet (15 mg total) by mouth daily with supper.  . rosuvastatin (CRESTOR) 20 MG tablet Take 1 tablet (20 mg total) by mouth daily at 6 PM.   No current facility-administered medications for this visit. (Other)      REVIEW OF SYSTEMS:    ALLERGIES Allergies  Allergen  Reactions  . Lactose Intolerance (Gi)     PAST MEDICAL HISTORY Past Medical History:  Diagnosis Date  . Abnormal stress test    a. 02/2017 MV: large region of fixed perfusion defect in basal to mid inf, mid-dist inflat walls, EF 43%. No ischemia (EF 55-60% by f/u echo).  . Arthritis   . Asthma   . Carotid arterial disease ([place])    a. 09/2017 Carotid U/S: 40-49% bilat ICA stenosis.  . Chronic back pain   . Coronary artery calcification seen on CT scan    a. 11/2017 CT Abd/Pelvis: Multi vessel coronary vascular Ca2+.  . Depression   . Diabetes mellitus   . Diabetic neuropathy ([place])   . Difficult intubation    DIFFICULT AIRWAY/FYI  . Family history of adverse reaction to anesthesia    mother had difficlty waking   .  Femoral-popliteal bypass graft occlusion, left ([place]) 12/02/2017  . GERD (gastroesophageal reflux disease)   . History of echocardiogram    a. 03/2017 Echo: EF 55-60%, mild LVH, nl RV fxn.  . Hyperlipidemia   . Ischemic cardiomyopathy    a. 04/2018 Echo: EF 30-35%, Gr2 DD, mild LVH. Mild MR. Mildly dil LA, mod dil RV w/ mod red RV fxn, mild TR, PASP 15mmHg.  . NSTEMI (non-ST elevated myocardial infarction) ([place])    a. 05/2018 in setting of Afib, sepsis, and post-op L AKA. Peak trop 7.7. EF 30-35% by echo-->cath not performed 2/2 renal failure.  . 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 PAD (peripheral artery disease) ([place])    a. S/p L fem-pop bypass; b. 11/2017 s/p Aortobifem bypass 2/2 graft occlusion; c. 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  . Paroxysmal atrial fibrillation with rapid ventricular response ([place]) 12/02/2017   a. CHA2DS2VASc = 3-->Xarelto; b. 05/2018 Recurrent Afib-->amio.  Marland Kitchen Ulcer    Foot   Past Surgical History:  Procedure Laterality Date  . ABDOMINAL AORTAGRAM  June 15, 2014  . ABDOMINAL AORTAGRAM N/A 06/15/2014   Procedure: ABDOMINAL Maxcine Ham;  Surgeon: Serafina Mitchell, MD;  Location: [facility] CATH LAB;  Service: Cardiovascular;  Laterality: N/A;  . ABDOMINAL AORTAGRAM N/A 11/22/2014   Procedure: ABDOMINAL AORTAGRAM;  Surgeon: Serafina Mitchell, MD;  Location: [facility] CATH LAB;  Service: Cardiovascular;  Laterality: N/A;  . ABDOMINAL AORTOGRAM W/LOWER EXTREMITY N/A 01/07/2017   Procedure: Abdominal Aortogram w/Lower Extremity;  Surgeon: Serafina Mitchell, MD;  Location: [place] CV LAB;  Service: Cardiovascular;  Laterality: N/A;  . ABDOMINAL AORTOGRAM W/LOWER EXTREMITY N/A 10/31/2017   Procedure: ABDOMINAL AORTOGRAM W/LOWER EXTREMITY;  Surgeon: Elam Dutch, MD;  Location: [place] CV LAB;  Service: Cardiovascular;  Laterality: N/A;  . ABDOMINAL AORTOGRAM W/LOWER EXTREMITY N/A 03/24/2018   Procedure: ABDOMINAL AORTOGRAM W/LOWER EXTREMITY;   Surgeon: Serafina Mitchell, MD;  Location: [place] CV LAB;  Service: Cardiovascular;  Laterality: N/A;  . AMPUTATION Left 04/26/2018   Procedure: AMPUTATION ABOVE KNEE;  Surgeon: Elam Dutch, MD;  Location: [place];  Service: Vascular;  Laterality: Left;  . AORTA - BILATERAL FEMORAL ARTERY BYPASS GRAFT N/A 11/28/2017   Procedure: AORTA BIFEMORAL BYPASS USING HEMASHIELD GOLD GRAFT & REIMPLANT IMA;  Surgeon: Serafina Mitchell, MD;  Location: [facility] OR;  Service: Vascular;  Laterality: N/A;  . AORTIC ARCH ANGIOGRAPHY N/A 10/31/2017   Procedure: AORTIC ARCH ANGIOGRAPHY;  Surgeon: Elam Dutch, MD;  Location: [place] CV LAB;  Service: Cardiovascular;  Laterality: N/A;  . APPLICATION OF WOUND  VAC  11/28/2017   Procedure: APPLICATION OF WOUND VAC;  Surgeon: Serafina Mitchell, MD;  Location: [facility] OR;  Service: Vascular;;  . APPLICATION OF WOUND VAC Left 03/27/2018   Procedure: APPLICATION OF WOUND VAC LEFT GROIN USING PREVENA PLUS;  Surgeon: Serafina Mitchell, MD;  Location: [place];  Service: Vascular;  Laterality: Left;  . ARTERIAL BYPASS SURGERY   07/05/2010   Right Common Femoral to below knee popliteal BPG  . BACK SURGERY     X's  2  . CARDIAC CATHETERIZATION    . CHOLECYSTECTOMY     Gall Bladder  . CYSTECTOMY Left    wrist  . EMBOLECTOMY Left 11/28/2017   Procedure: Left Lower Extremity Embolectomy, Left Tibial Peroneal Trunk Endarterectomy with Patch Angioplasty; Vein Harvest Small Saphenous Graft Left Lower Leg;  Surgeon: [name], MD;  Location: [place];  Service: Vascular;  Laterality: Left;  . EYE SURGERY Left 02/23/2020   Dr. Zadie Rhine  . FEMORAL-POPLITEAL BYPASS GRAFT Left 03/27/2018   Procedure: THROMBECTOMY OF LEFT FEMORAL TIBIAL BYPASS;  Surgeon: Serafina Mitchell, MD;  Location: [place];  Service: Vascular;  Laterality: Left;  . FEMORAL-TIBIAL BYPASS GRAFT Left 03/27/2018   Procedure: BYPASS GRAFT FEMORAL-TIBIAL ARTERY LEFT REDO USING CRYOPRESERVED SAPHENOUS VEIN 70cm;   Surgeon: Serafina Mitchell, MD;  Location: [facility] OR;  Service: Vascular;  Laterality: Left;  . INTERCOSTAL NERVE BLOCK  November 2015  . INTRAOPERATIVE ARTERIOGRAM  11/28/2017   Procedure: INTRA OPERATIVE ARTERIOGRAM OF LEFT LEG;  Surgeon: Serafina Mitchell, MD;  Location: [place];  Service: Vascular;;  . INTRAOPERATIVE ARTERIOGRAM Left 03/27/2018   Procedure: INTRA OPERATIVE ARTERIOGRAM TIMES TWO;  Surgeon: Serafina Mitchell, MD;  Location: [place];  Service: Vascular;  Laterality: Left;  . IR FLUORO GUIDE CV LINE RIGHT  03/20/2017  . IR FLUORO GUIDE CV LINE RIGHT  05/05/2018  . IR REMOVAL TUN CV CATH W/O FL  05/20/2018  . IR US GUIDE VASC ACCESS RIGHT  03/20/2017  . IR US GUIDE VASC ACCESS RIGHT  05/05/2018  . IRRIGATION AND DEBRIDEMENT BUTTOCKS Right 09/30/2016   Procedure: DEBRIDEMENT RIGHT  BUTTOCK WOUND;  Surgeon: Georganna Skeans, MD;  Location: [place];  Service: General;  Laterality: Right;  . left foot surgery    . PERIPHERAL VASCULAR CATHETERIZATION N/A 05/07/2016   Procedure: Abdominal Aortogram;  Surgeon: Serafina Mitchell, MD;  Location: [place] CV LAB;  Service: Cardiovascular;  Laterality: N/A;  . PERIPHERAL VASCULAR CATHETERIZATION N/A 05/07/2016   Procedure: Lower Extremity Angiography;  Surgeon: Serafina Mitchell, MD;  Location: [place] CV LAB;  Service: Cardiovascular;  Laterality: N/A;  . PERIPHERAL VASCULAR CATHETERIZATION N/A 05/07/2016   Procedure: Aortic Arch Angiography;  Surgeon: Serafina Mitchell, MD;  Location: [place] CV LAB;  Service: Cardiovascular;  Laterality: N/A;  . PERIPHERAL VASCULAR CATHETERIZATION N/A 05/07/2016   Procedure: Upper Extremity Angiography;  Surgeon: Serafina Mitchell, MD;  Location: [place] CV LAB;  Service: Cardiovascular;  Laterality: N/A;  . PERIPHERAL VASCULAR CATHETERIZATION Right 05/07/2016   Procedure: Peripheral Vascular Balloon Angioplasty;  Surgeon: Serafina Mitchell, MD;  Location: [place] CV LAB;  Service: Cardiovascular;  Laterality: Right;   subclavian  . PERIPHERAL VASCULAR CATHETERIZATION Right 05/07/2016   Procedure: Peripheral Vascular Intervention;  Surgeon: Serafina Mitchell, MD;  Location: [place] CV LAB;  Service: Cardiovascular;  Laterality: Right;  External  Iliac  . SKIN GRAFT Right 2012   [name] by Dr. Nils Pyle- Right and Left Ankle  .  SPINE SURGERY    . THROMBECTOMY FEMORAL ARTERY  11/28/2017   Procedure: THROMBECTOMY  & REVISION OF BILATERAL FEMORAL TO POPLETEAL ARTERIES;  Surgeon: Serafina Mitchell, MD;  Location: [facility] OR;  Service: Vascular;;  . TONSILLECTOMY      FAMILY HISTORY Family History  Problem Relation Age of Onset  . Coronary artery disease Mother   . Peripheral vascular disease Mother   . Heart disease Mother        Before age 53  . Other Mother        Venous insuffiency  . Diabetes Mother   . Hyperlipidemia Mother   . Hypertension Mother   . Varicose Veins Mother   . Heart attack Mother        before age 56  . Heart disease Father   . Diabetes Father   . Diabetes Maternal Grandmother   . Diabetes Paternal Grandmother   . Diabetes Paternal Grandfather   . Diabetes Sister   . Hypertension Sister   . Diabetes Brother   . Hypertension Brother     SOCIAL HISTORY Social History   Tobacco Use  . Smoking status: Current Some Day Smoker    Packs/day: 1.00    Years: 30.00    Pack years: 30.00    Types: Cigarettes    Last attempt to quit: 02/07/2017    Years since quitting: 3.1  . Smokeless tobacco: Never Used  Substance Use Topics  . Alcohol use: No    Alcohol/week: 0.0 standard drinks  . Drug use: No         OPHTHALMIC EXAM: Base Eye Exam    Visual Acuity (Snellen - Linear)      Right Left   Dist [place] NLP CF @ 7'   Dist ph [place] NI NI       Tonometry (Tonopen, 3:44 PM)      Right Left   Pressure 25 10       Pupils      Pupils Dark Light Shape React APD   Right PERRL 4 4 Round Minimal None   Left PERRL 4 3 Round Slow None       Visual Fields      Left Right    Restrictions Total inferior temporal, inferior nasal deficiencies; Partial inner superior temporal, superior nasal deficiencies Total superior temporal, inferior temporal, superior nasal, inferior nasal deficiencies       Extraocular Movement      Right Left    Full Full       Neuro/Psych    Oriented x3: Yes   Mood/Affect: Normal       Dilation    Left eye: 1.0% Mydriacyl, 2.5% Phenylephrine @ 3:44 PM        Slit Lamp and Fundus Exam    External Exam      Right Left   External Normal Normal       Slit Lamp Exam      Right Left   Lids/Lashes Normal Normal   Conjunctiva/Sclera White and quiet White and quiet   Cornea Clear Clear   Anterior Chamber Deep and quiet Deep and quiet   Iris Round and reactive Round and reactive   Lens  Posterior chamber intraocular lens   Vitreous Normal Normal          IMAGING AND PROCEDURES  Imaging and Procedures for 04/10/20  Color Fundus Photography Optos - OU - Both Eyes       Right Eye Progression has no prior data.  ASSESSMENT/PLAN:  No problem-specific Assessment & Plan notes found for this encounter.    No diagnosis found.  1.  2.  3.  Ophthalmic Meds Ordered this visit:  No orders of the defined types were placed in this encounter.      No follow-ups on file.  There are no Patient Instructions on file for this visit.   Explained the diagnoses, plan, and follow up with the patient and they expressed understanding.  Patient expressed understanding of the importance of proper follow up care.   Clent Demark Telesia Ates M.D. Diseases & Surgery of the Retina and Vitreous [facility] [place] 04/10/20     Abbreviations: M myopia (nearsighted); A astigmatism; H hyperopia (farsighted); P presbyopia; Mrx spectacle prescription;  CTL contact lenses; OD right eye; OS left eye; OU both eyes  XT exotropia; ET esotropia; PEK punctate epithelial keratitis; PEE punctate epithelial erosions; DES  dry eye syndrome; MGD meibomian gland dysfunction; ATs artificial tears; PFAT's preservative free artificial tears; [place] nuclear sclerotic cataract; PSC posterior subcapsular cataract; ERM epi-retinal membrane; PVD posterior vitreous detachment; RD retinal detachment; DM diabetes mellitus; DR diabetic retinopathy; NPDR non-proliferative diabetic retinopathy; PDR proliferative diabetic retinopathy; CSME clinically significant macular edema; DME diabetic macular edema; dbh dot blot hemorrhages; CWS cotton wool spot; POAG primary open angle glaucoma; C/D cup-to-disc ratio; HVF humphrey visual field; GVF goldmann visual field; OCT optical coherence tomography; IOP intraocular pressure; BRVO Branch retinal vein occlusion; CRVO central retinal vein occlusion; CRAO central retinal artery occlusion; BRAO branch retinal artery occlusion; RT retinal tear; SB scleral buckle; PPV pars plana vitrectomy; VH Vitreous hemorrhage; PRP panretinal laser photocoagulation; IVK intravitreal kenalog; VMT vitreomacular traction; MH Macular hole;  NVD neovascularization of the disc; NVE neovascularization elsewhere; AREDS age related eye disease study; ARMD age related macular degeneration; POAG primary open angle glaucoma; EBMD epithelial/anterior basement membrane dystrophy; ACIOL anterior chamber intraocular lens; IOL intraocular lens; PCIOL posterior chamber intraocular lens; Phaco/IOL phacoemulsification with intraocular lens placement; [place] photorefractive keratectomy; LASIK laser assisted in situ keratomileusis; HTN hypertension; DM diabetes mellitus; COPD chronic obstructive pulmonary disease

## 2020-08-14 ENCOUNTER — Ambulatory Visit (INDEPENDENT_AMBULATORY_CARE_PROVIDER_SITE_OTHER): Payer: Medicare Other | Admitting: Ophthalmology

## 2020-08-14 ENCOUNTER — Encounter (INDEPENDENT_AMBULATORY_CARE_PROVIDER_SITE_OTHER): Payer: Self-pay | Admitting: Ophthalmology

## 2020-08-14 ENCOUNTER — Other Ambulatory Visit: Payer: Self-pay

## 2020-08-14 DIAGNOSIS — Z794 Long term (current) use of insulin: Secondary | ICD-10-CM | POA: Diagnosis not present

## 2020-08-14 DIAGNOSIS — E113592 Type 2 diabetes mellitus with proliferative diabetic retinopathy without macular edema, left eye: Secondary | ICD-10-CM

## 2020-08-14 DIAGNOSIS — E113552 Type 2 diabetes mellitus with stable proliferative diabetic retinopathy, left eye: Secondary | ICD-10-CM | POA: Diagnosis not present

## 2020-08-14 DIAGNOSIS — E113532 Type 2 diabetes mellitus with proliferative diabetic retinopathy with traction retinal detachment not involving the macula, left eye: Secondary | ICD-10-CM | POA: Diagnosis not present

## 2020-08-14 NOTE — Progress Notes (Signed)
08/14/2020     CHIEF COMPLAINT Patient presents for Retina Follow Up   HISTORY OF PRESENT ILLNESS: [name] is a 54 y.o. female who presents to the clinic today for:   HPI    Retina Follow Up    Patient presents with  Diabetic Retinopathy.  In both eyes.  This started 4 months ago.  Severity is moderate.  Duration of 4 months.  Since onset it is stable.          Comments    4 Month Diabetic F/U OU  Pt denies noticeable changes to [place] OU since last visit. Pt denies ocular pain, flashes of light, or floaters OU.  Pt c/o itching and drainage OU due to seasonal allergies. A1c: pt does not recall LBS: 138 yesterday AM        Last edited by Rockie Neighbours, [place] on 08/14/2020  3:40 PM. (History)      Referring physician: Frazier Richards, [place],  [place] [address]  HISTORICAL INFORMATION:   Selected notes from the MEDICAL RECORD NUMBER    Lab Results  Component Value Date   HGBA1C 8.4 (H) 03/24/2018     CURRENT MEDICATIONS: No current outpatient medications on file. (Ophthalmic Drugs)   No current facility-administered medications for this visit. (Ophthalmic Drugs)   Current Outpatient Medications (Other)  Medication Sig  . albuterol (PROVENTIL) 2 MG tablet Take 2 mg by mouth 3 (three) times daily as needed for shortness of breath.   Marland Kitchen amitriptyline (ELAVIL) 25 MG tablet TAKE 1 TABLET BY MOUTH AT BEDTIME FOR LEG PAIN  . cetirizine (ZYRTEC) 10 MG tablet Take 10 mg by mouth daily.   . diphenoxylate-atropine (LOMOTIL) 2.5-0.025 MG tablet Take 1 tablet by mouth 4 (four) times daily as needed for diarrhea or loose stools.  . ferrous sulfate 325 (65 FE) MG tablet Take 1 tablet (325 mg total) by mouth 2 (two) times daily with a meal.  . furosemide (LASIX) 40 MG tablet Take 1 tablet (40 mg total) by mouth daily for 30 days.  Marland Kitchen HYDROcodone-acetaminophen (NORCO) 10-325 MG tablet LIMIT 1 2 TO 1 (ONE HALF TO ONE) TABLET BY MOUTH PER DAY OR 2 4 TIMES DAILY  IF TOLERATED. NO OXYCODONE.  Marland Kitchen insulin glargine (LANTUS) 100 UNIT/ML injection Inject 0.18 mLs (18 Units total) into the skin 2 (two) times daily.  . insulin lispro (HUMALOG) 100 UNIT/ML injection For glucose 121-150 use TWO units,  for 151-200 use FOUR units, for 201-250 use SIX units, for 251-300 use EIGHT units, for 301-350 use TEN units. For blood sugars greater than 350 call MD.  . loperamide (IMODIUM A-D) 2 MG tablet Take 12 mg by mouth daily as needed for diarrhea or loose stools.  . metoprolol tartrate (LOPRESSOR) 25 MG tablet Take 0.5 tablets (12.5 mg total) by mouth 2 (two) times daily.  . nitroGLYCERIN (NITROSTAT) 0.4 MG SL tablet Place 1 tablet (0.4 mg total) under the tongue every 5 (five) minutes as needed for chest pain.  Marland Kitchen omeprazole (PRILOSEC) 20 MG capsule Take 20 mg by mouth daily.  . ondansetron (ZOFRAN-ODT) 4 MG disintegrating tablet DISSOLVE 1 TABLET IN MOUTH EVERY 8 HOURS AS NEEDED FOR NAUSEA OR VOMITING  . pregabalin (LYRICA) 50 MG capsule Take 1 capsule (50 mg total) by mouth 2 (two) times daily.  . Rivaroxaban (XARELTO) 15 MG TABS tablet Take 1 tablet (15 mg total) by mouth daily with supper.  . rosuvastatin (CRESTOR) 20 MG tablet Take  1 tablet (20 mg total) by mouth daily at 6 PM.   No current facility-administered medications for this visit. (Other)      REVIEW OF SYSTEMS:    ALLERGIES Allergies  Allergen Reactions  . Lactose Intolerance (Gi)     PAST MEDICAL HISTORY Past Medical History:  Diagnosis Date  . Abnormal stress test    a. 02/2017 MV: large region of fixed perfusion defect in basal to mid inf, mid-dist inflat walls, EF 43%. No ischemia (EF 55-60% by f/u echo).  . Arthritis   . Asthma   . Carotid arterial disease ([place])    a. 09/2017 Carotid U/S: 40-49% bilat ICA stenosis.  . Chronic back pain   . Coronary artery calcification seen on CT scan    a. 11/2017 CT Abd/Pelvis: Multi vessel coronary vascular Ca2+.  . Depression   . Diabetes mellitus    . Diabetic neuropathy ([place])   . Difficult intubation    DIFFICULT AIRWAY/FYI  . Family history of adverse reaction to anesthesia    mother had difficlty waking   . Femoral-popliteal bypass graft occlusion, left ([place]) 12/02/2017  . GERD (gastroesophageal reflux disease)   . History of echocardiogram    a. 03/2017 Echo: EF 55-60%, mild LVH, nl RV fxn.  . Hyperlipidemia   . Ischemic cardiomyopathy    a. 04/2018 Echo: EF 30-35%, Gr2 DD, mild LVH. Mild MR. Mildly dil LA, mod dil RV w/ mod red RV fxn, mild TR, PASP 67mmHg.  . NSTEMI (non-ST elevated myocardial infarction) ([place])    a. 05/2018 in setting of Afib, sepsis, and post-op L AKA. Peak trop 7.7. EF 30-35% by echo-->cath not performed 2/2 renal failure.  . Osteomyelitis of right fibula ([place]) 03/05/2017  . PAD (peripheral artery disease) ([place])    a. S/p L fem-pop bypass; b. 11/2017 s/p Aortobifem bypass 2/2 graft occlusion; c. 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  . Paroxysmal atrial fibrillation with rapid ventricular response ([place]) 12/02/2017   a. CHA2DS2VASc = 3-->Xarelto; b. 05/2018 Recurrent Afib-->amio.  Marland Kitchen Ulcer    Foot   Past Surgical History:  Procedure Laterality Date  . ABDOMINAL AORTAGRAM  June 15, 2014  . ABDOMINAL AORTAGRAM N/A 06/15/2014   Procedure: ABDOMINAL Maxcine Ham;  Surgeon: Serafina Mitchell, MD;  Location: [facility] CATH LAB;  Service: Cardiovascular;  Laterality: N/A;  . ABDOMINAL AORTAGRAM N/A 11/22/2014   Procedure: ABDOMINAL AORTAGRAM;  Surgeon: Serafina Mitchell, MD;  Location: [facility] CATH LAB;  Service: Cardiovascular;  Laterality: N/A;  . ABDOMINAL AORTOGRAM W/LOWER EXTREMITY N/A 01/07/2017   Procedure: Abdominal Aortogram w/Lower Extremity;  Surgeon: Serafina Mitchell, MD;  Location: [place] CV LAB;  Service: Cardiovascular;  Laterality: N/A;  . ABDOMINAL AORTOGRAM W/LOWER EXTREMITY N/A 10/31/2017   Procedure: ABDOMINAL AORTOGRAM W/LOWER EXTREMITY;  Surgeon: Elam Dutch, MD;  Location:  [place] CV LAB;  Service: Cardiovascular;  Laterality: N/A;  . ABDOMINAL AORTOGRAM W/LOWER EXTREMITY N/A 03/24/2018   Procedure: ABDOMINAL AORTOGRAM W/LOWER EXTREMITY;  Surgeon: Serafina Mitchell, MD;  Location: [place] CV LAB;  Service: Cardiovascular;  Laterality: N/A;  . AMPUTATION Left 04/26/2018   Procedure: AMPUTATION ABOVE KNEE;  Surgeon: Elam Dutch, MD;  Location: [place];  Service: Vascular;  Laterality: Left;  . AORTA - BILATERAL FEMORAL ARTERY BYPASS GRAFT N/A 11/28/2017   Procedure: AORTA BIFEMORAL BYPASS USING HEMASHIELD GOLD GRAFT & REIMPLANT IMA;  Surgeon: Serafina Mitchell, MD;  Location: [facility] OR;  Service: Vascular;  Laterality: N/A;  .  AORTIC ARCH ANGIOGRAPHY N/A 10/31/2017   Procedure: AORTIC ARCH ANGIOGRAPHY;  Surgeon: Elam Dutch, MD;  Location: [place] CV LAB;  Service: Cardiovascular;  Laterality: N/A;  . APPLICATION OF WOUND VAC  11/28/2017   Procedure: APPLICATION OF WOUND VAC;  Surgeon: Serafina Mitchell, MD;  Location: [facility] OR;  Service: Vascular;;  . APPLICATION OF WOUND VAC Left 03/27/2018   Procedure: APPLICATION OF WOUND VAC LEFT GROIN USING PREVENA PLUS;  Surgeon: Serafina Mitchell, MD;  Location: [place];  Service: Vascular;  Laterality: Left;  . ARTERIAL BYPASS SURGERY   07/05/2010   Right Common Femoral to below knee popliteal BPG  . BACK SURGERY     X's  2  . CARDIAC CATHETERIZATION    . CHOLECYSTECTOMY     Gall Bladder  . CYSTECTOMY Left    wrist  . EMBOLECTOMY Left 11/28/2017   Procedure: Left Lower Extremity Embolectomy, Left Tibial Peroneal Trunk Endarterectomy with Patch Angioplasty; Vein Harvest Small Saphenous Graft Left Lower Leg;  Surgeon: [name], MD;  Location[place];  Service: Vascular;  Laterality: Left;  . EYE SURGERY Left 02/23/2020   Dr. Zadie Rhine  . FEMORAL-POPLITEAL BYPASS GRAFT Left 03/27/2018   Procedure: THROMBECTOMY OF LEFT FEMORAL TIBIAL BYPASS;  Surgeon: Serafina Mitchell, MD;  Location: [place];  Service: Vascular;   Laterality: Left;  . FEMORAL-TIBIAL BYPASS GRAFT Left 03/27/2018   Procedure: BYPASS GRAFT FEMORAL-TIBIAL ARTERY LEFT REDO USING CRYOPRESERVED SAPHENOUS VEIN 70cm;  Surgeon: Serafina Mitchell, MD;  Location: [facility] OR;  Service: Vascular;  Laterality: Left;  . INTERCOSTAL NERVE BLOCK  November 2015  . INTRAOPERATIVE ARTERIOGRAM  11/28/2017   Procedure: INTRA OPERATIVE ARTERIOGRAM OF LEFT LEG;  Surgeon: Serafina Mitchell, MD;  Location: [place];  Service: Vascular;;  . INTRAOPERATIVE ARTERIOGRAM Left 03/27/2018   Procedure: INTRA OPERATIVE ARTERIOGRAM TIMES TWO;  Surgeon: Serafina Mitchell, MD;  Location: [place];  Service: Vascular;  Laterality: Left;  . IR FLUORO GUIDE CV LINE RIGHT  03/20/2017  . IR FLUORO GUIDE CV LINE RIGHT  05/05/2018  . IR REMOVAL TUN CV CATH W/O FL  05/20/2018  . IR US GUIDE VASC ACCESS RIGHT  03/20/2017  . IR US GUIDE VASC ACCESS RIGHT  05/05/2018  . IRRIGATION AND DEBRIDEMENT BUTTOCKS Right 09/30/2016   Procedure: DEBRIDEMENT RIGHT  BUTTOCK WOUND;  Surgeon: Georganna Skeans, MD;  Location: [place];  Service: General;  Laterality: Right;  . left foot surgery    . PERIPHERAL VASCULAR CATHETERIZATION N/A 05/07/2016   Procedure: Abdominal Aortogram;  Surgeon: Serafina Mitchell, MD;  Location: [place] CV LAB;  Service: Cardiovascular;  Laterality: N/A;  . PERIPHERAL VASCULAR CATHETERIZATION N/A 05/07/2016   Procedure: Lower Extremity Angiography;  Surgeon: Serafina Mitchell, MD;  Location: [place] CV LAB;  Service: Cardiovascular;  Laterality: N/A;  . PERIPHERAL VASCULAR CATHETERIZATION N/A 05/07/2016   Procedure: Aortic Arch Angiography;  Surgeon: Serafina Mitchell, MD;  Location: [place] CV LAB;  Service: Cardiovascular;  Laterality: N/A;  . PERIPHERAL VASCULAR CATHETERIZATION N/A 05/07/2016   Procedure: Upper Extremity Angiography;  Surgeon: Serafina Mitchell, MD;  Location: [place] CV LAB;  Service: Cardiovascular;  Laterality: N/A;  . PERIPHERAL VASCULAR CATHETERIZATION Right 05/07/2016     Procedure: Peripheral Vascular Balloon Angioplasty;  Surgeon: Serafina Mitchell, MD;  Location: [place] CV LAB;  Service: Cardiovascular;  Laterality: Right;  subclavian  . PERIPHERAL VASCULAR CATHETERIZATION Right 05/07/2016   Procedure: Peripheral Vascular Intervention;  Surgeon: Butch Penny  Trula Slade, MD;  Location: [place] CV LAB;  Service: Cardiovascular;  Laterality: Right;  External  Iliac  . SKIN GRAFT Right 2012   [name] by Dr. Nils Pyle- Right and Left Ankle  . SPINE SURGERY    . THROMBECTOMY FEMORAL ARTERY  11/28/2017   Procedure: THROMBECTOMY  & REVISION OF BILATERAL FEMORAL TO POPLETEAL ARTERIES;  Surgeon: Serafina Mitchell, MD;  Location: [facility];  Service: Vascular;;  . TONSILLECTOMY      FAMILY HISTORY Family History  Problem Relation Age of Onset  . Coronary artery disease Mother   . Peripheral vascular disease Mother   . Heart disease Mother        Before age 67  . Other Mother        Venous insuffiency  . Diabetes Mother   . Hyperlipidemia Mother   . Hypertension Mother   . Varicose Veins Mother   . Heart attack Mother        before age 16  . Heart disease Father   . Diabetes Father   . Diabetes Maternal Grandmother   . Diabetes Paternal Grandmother   . Diabetes Paternal Grandfather   . Diabetes Sister   . Hypertension Sister   . Diabetes Brother   . Hypertension Brother     SOCIAL HISTORY Social History   Tobacco Use  . Smoking status: Current Some Day Smoker    Packs/day: 1.00    Years: 30.00    Pack years: 30.00    Types: Cigarettes    Last attempt to quit: 02/07/2017    Years since quitting: 3.5  . Smokeless tobacco: Never Used  Vaping Use  . Vaping Use: Never used  Substance Use Topics  . Alcohol use: No    Alcohol/week: 0.0 standard drinks  . Drug use: No         OPHTHALMIC EXAM: Base Eye Exam    Visual Acuity (ETDRS)      Right Left   Dist [place] NLP CF @ 1'   Dist ph [place] NI        Tonometry (Tonopen, 3:37 PM)      Right Left    Pressure 19 14       Pupils      Dark Light Shape React APD   Right 5 5 Round Minimal None   Left 4 3 Round Slow None       Visual Fields (Counting fingers)      Left Right   Restrictions Total superior temporal, inferior temporal, inferior nasal deficiencies Total superior temporal, inferior temporal, superior nasal, inferior nasal deficiencies       Extraocular Movement      Right Left    Full Full       Neuro/Psych    Oriented x3: Yes   Mood/Affect: Normal       Dilation    Both eyes: 1.0% Mydriacyl, 2.5% Phenylephrine @ 3:43 PM        Slit Lamp and Fundus Exam    External Exam      Right Left   External Normal Normal       Slit Lamp Exam      Right Left   Lids/Lashes Normal Normal   Conjunctiva/Sclera White and quiet White and quiet   Cornea Clear Clear   Anterior Chamber Deep and quiet Deep and quiet   Iris Round and reactive Round and reactive   Lens  Posterior chamber intraocular lens   Anterior Vitreous Normal Normal  Fundus Exam      Right Left   Posterior Vitreous  Vitrectomized, clear   Disc  Old fibrovascular proliferation elsewhere, fibrotic   C/D Ratio  0.25   Macula   attached   Vessels  Proliferative diabetic retinopathy OU quiescent   Periphery  Good PRP, macular flat          IMAGING AND PROCEDURES  Imaging and Procedures for 08/14/20  OCT, Retina - OU - Both Eyes       Left Eye Quality was borderline. Scan locations included nasal, subfoveal. Central Foveal Thickness: 351. Progression has improved. Findings include abnormal foveal contour.   Notes OD, no views  OS, much less retinal thickening juxtapapillary, and in the fovea yet subfoveal scarring does limit acuity.  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 The patient was advised to maintain good glucose, blood pressure, monitor kidney function and serum lipid control as advised by personal physician. Rare risk for reactivation of progression exist with untreated severe anemia, untreated renal failure, untreated heart failure, and smoking. Complete avoidance of smoking was recommended. The chance of recurrent proliferative diabetic retinopathy was discussed as well as the chance of vitreous hemorrhage for which further treatments may be necessary.   Explained to the patient that the quiescent  proliferative diabetic retinopathy disease is unlikely to ever worsen.  Worsening factors would include however severe anemia, hypertension out-of-control or impending renal failure.  Vision left eye limited by disruption of intraretinal architecture from prior traction retinal detachment and no atrophy.      ICD-10-CM   1. Proliferative diabetic retinopathy of left eye without macular edema associated with type 2 diabetes mellitus (HCC)  P82.4235 OCT, Retina - OU - Both Eyes  2. 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 Stable treated proliferative diabetic retinopathy of left eye determined by examination associated with type 2 diabetes mellitus ([place])  V40.0867     1.  Patient informed the critical importance of no smoking because of the effect of carbon monoxide on the retina and its vasculature and its tissues by blocking effect of  beneficial oxygen  2.  PDR the left eye could progress simply on the basis of nonperfusion and poor oxygenation and hypoxia 3.  Ophthalmic Meds Ordered this visit:  No orders of the defined types were placed in this encounter.      Return in about 9 months (around 05/14/2021) for COLOR FP, dilate, OS.  There are no Patient Instructions on file for this visit.   Explained the diagnoses, plan, and follow up with the patient and they expressed understanding.  Patient expressed understanding of the importance of proper follow up care.   Clent Demark Cesilia Shinn M.D. Diseases & Surgery of the Retina and Vitreous Retina & Diabetic [place] 08/14/20     Abbreviations: Jerilynn Mages  myopia (nearsighted); A astigmatism; H hyperopia (farsighted); P presbyopia; Mrx spectacle prescription;  CTL contact lenses; OD right eye; OS left eye; OU both eyes  XT exotropia; ET esotropia; PEK punctate epithelial keratitis; PEE punctate epithelial erosions; DES dry eye syndrome; MGD meibomian gland dysfunction; ATs artificial tears; PFAT's preservative free artificial tears; [place] nuclear sclerotic cataract; PSC posterior subcapsular cataract; ERM epi-retinal membrane; PVD posterior vitreous detachment; RD retinal detachment; DM diabetes mellitus; DR diabetic retinopathy; NPDR non-proliferative diabetic retinopathy; PDR proliferative diabetic retinopathy; CSME clinically significant macular edema; DME diabetic macular edema; dbh dot blot hemorrhages; CWS cotton wool spot; POAG primary open angle glaucoma; C/D cup-to-disc ratio; HVF humphrey visual field; GVF goldmann visual field; OCT optical coherence tomography; IOP intraocular pressure; BRVO Branch retinal vein occlusion; CRVO central retinal vein occlusion; CRAO central retinal artery occlusion; BRAO branch retinal artery occlusion; RT retinal tear; SB scleral buckle; PPV pars plana vitrectomy; VH Vitreous hemorrhage; PRP panretinal laser photocoagulation; IVK intravitreal kenalog;  VMT vitreomacular traction; MH Macular hole;  NVD neovascularization of the disc; NVE neovascularization elsewhere; AREDS age related eye disease study; ARMD age related macular degeneration; POAG primary open angle glaucoma; EBMD epithelial/anterior basement membrane dystrophy; ACIOL anterior chamber intraocular lens; IOL intraocular lens; PCIOL posterior chamber intraocular lens; Phaco/IOL phacoemulsification with intraocular lens placement; [place] photorefractive keratectomy; LASIK laser assisted in situ keratomileusis; HTN hypertension; DM diabetes mellitus; COPD chronic obstructive pulmonary disease

## 2020-08-14 NOTE — Assessment & Plan Note (Signed)
The nature of regressed proliferative diabetic retinopathy was discussed with the patient. The patient was advised to maintain good glucose, blood pressure, monitor kidney function and serum lipid control as advised by personal physician. Rare risk for reactivation of progression exist with untreated severe anemia, untreated renal failure, untreated heart failure, and smoking. Complete avoidance of smoking was recommended. The chance of recurrent proliferative diabetic retinopathy was discussed as well as the chance of vitreous hemorrhage for which further treatments may be necessary.   Explained to the patient that the quiescent  proliferative diabetic retinopathy disease is unlikely to ever worsen.  Worsening factors would include however severe anemia, hypertension out-of-control or impending renal failure.  Vision left eye limited by disruption of intraretinal architecture from prior traction retinal detachment and no atrophy.

## 2020-08-14 NOTE — Assessment & Plan Note (Signed)
This condition now resolved into quiescent proliferative diabetic retinopathy

## 2020-10-06 ENCOUNTER — Other Ambulatory Visit: Payer: Self-pay

## 2020-10-06 ENCOUNTER — Emergency Department (HOSPITAL_COMMUNITY): Payer: Medicare Other

## 2020-10-06 ENCOUNTER — Encounter (HOSPITAL_COMMUNITY): Payer: Self-pay | Admitting: Emergency Medicine

## 2020-10-06 ENCOUNTER — Inpatient Hospital Stay (HOSPITAL_COMMUNITY)
Admission: EM | Admit: 2020-10-06 | Discharge: 2020-10-11 | DRG: 617 | Disposition: A | Discharge status: Home Health | Payer: Medicare Other | Attending: Internal Medicine | Admitting: Internal Medicine

## 2020-10-06 DIAGNOSIS — I252 Old myocardial infarction: Secondary | ICD-10-CM

## 2020-10-06 DIAGNOSIS — E119 Type 2 diabetes mellitus without complications: Secondary | ICD-10-CM

## 2020-10-06 DIAGNOSIS — I130 Hypertensive heart and chronic kidney disease with heart failure and stage 1 through stage 4 chronic kidney disease, or unspecified chronic kidney disease: Secondary | ICD-10-CM | POA: Diagnosis not present

## 2020-10-06 DIAGNOSIS — N179 Acute kidney failure, unspecified: Secondary | ICD-10-CM | POA: Diagnosis not present

## 2020-10-06 DIAGNOSIS — E872 Acidosis: Secondary | ICD-10-CM | POA: Diagnosis present

## 2020-10-06 DIAGNOSIS — R269 Unspecified abnormalities of gait and mobility: Secondary | ICD-10-CM | POA: Diagnosis present

## 2020-10-06 DIAGNOSIS — I739 Peripheral vascular disease, unspecified: Secondary | ICD-10-CM | POA: Diagnosis present

## 2020-10-06 DIAGNOSIS — I255 Ischemic cardiomyopathy: Secondary | ICD-10-CM | POA: Diagnosis present

## 2020-10-06 DIAGNOSIS — I5042 Chronic combined systolic (congestive) and diastolic (congestive) heart failure: Secondary | ICD-10-CM | POA: Diagnosis not present

## 2020-10-06 DIAGNOSIS — E1152 Type 2 diabetes mellitus with diabetic peripheral angiopathy with gangrene: Secondary | ICD-10-CM | POA: Diagnosis not present

## 2020-10-06 DIAGNOSIS — I2510 Atherosclerotic heart disease of native coronary artery without angina pectoris: Secondary | ICD-10-CM | POA: Diagnosis present

## 2020-10-06 DIAGNOSIS — J45909 Unspecified asthma, uncomplicated: Secondary | ICD-10-CM | POA: Diagnosis present

## 2020-10-06 DIAGNOSIS — E785 Hyperlipidemia, unspecified: Secondary | ICD-10-CM | POA: Diagnosis present

## 2020-10-06 DIAGNOSIS — E1122 Type 2 diabetes mellitus with diabetic chronic kidney disease: Secondary | ICD-10-CM | POA: Diagnosis not present

## 2020-10-06 DIAGNOSIS — L03119 Cellulitis of unspecified part of limb: Secondary | ICD-10-CM | POA: Diagnosis present

## 2020-10-06 DIAGNOSIS — E11319 Type 2 diabetes mellitus with unspecified diabetic retinopathy without macular edema: Secondary | ICD-10-CM | POA: Diagnosis present

## 2020-10-06 DIAGNOSIS — I96 Gangrene, not elsewhere classified: Secondary | ICD-10-CM

## 2020-10-06 DIAGNOSIS — Z89612 Acquired absence of left leg above knee: Secondary | ICD-10-CM

## 2020-10-06 DIAGNOSIS — M869 Osteomyelitis, unspecified: Secondary | ICD-10-CM | POA: Diagnosis not present

## 2020-10-06 DIAGNOSIS — I959 Hypotension, unspecified: Secondary | ICD-10-CM | POA: Diagnosis not present

## 2020-10-06 DIAGNOSIS — D62 Acute posthemorrhagic anemia: Secondary | ICD-10-CM | POA: Diagnosis not present

## 2020-10-06 DIAGNOSIS — I480 Paroxysmal atrial fibrillation: Secondary | ICD-10-CM | POA: Diagnosis present

## 2020-10-06 DIAGNOSIS — Z20822 Contact with and (suspected) exposure to covid-19: Secondary | ICD-10-CM | POA: Diagnosis not present

## 2020-10-06 DIAGNOSIS — G8929 Other chronic pain: Secondary | ICD-10-CM | POA: Diagnosis present

## 2020-10-06 DIAGNOSIS — E1169 Type 2 diabetes mellitus with other specified complication: Secondary | ICD-10-CM | POA: Diagnosis not present

## 2020-10-06 DIAGNOSIS — D631 Anemia in chronic kidney disease: Secondary | ICD-10-CM | POA: Diagnosis present

## 2020-10-06 DIAGNOSIS — Z9049 Acquired absence of other specified parts of digestive tract: Secondary | ICD-10-CM

## 2020-10-06 DIAGNOSIS — L03115 Cellulitis of right lower limb: Secondary | ICD-10-CM | POA: Diagnosis not present

## 2020-10-06 DIAGNOSIS — Z833 Family history of diabetes mellitus: Secondary | ICD-10-CM

## 2020-10-06 DIAGNOSIS — Z794 Long term (current) use of insulin: Secondary | ICD-10-CM

## 2020-10-06 DIAGNOSIS — K219 Gastro-esophageal reflux disease without esophagitis: Secondary | ICD-10-CM | POA: Diagnosis present

## 2020-10-06 DIAGNOSIS — Z79899 Other long term (current) drug therapy: Secondary | ICD-10-CM

## 2020-10-06 DIAGNOSIS — E669 Obesity, unspecified: Secondary | ICD-10-CM | POA: Diagnosis present

## 2020-10-06 DIAGNOSIS — E1165 Type 2 diabetes mellitus with hyperglycemia: Secondary | ICD-10-CM | POA: Diagnosis present

## 2020-10-06 DIAGNOSIS — Z7901 Long term (current) use of anticoagulants: Secondary | ICD-10-CM

## 2020-10-06 DIAGNOSIS — N1832 Chronic kidney disease, stage 3b: Secondary | ICD-10-CM | POA: Diagnosis present

## 2020-10-06 DIAGNOSIS — L97519 Non-pressure chronic ulcer of other part of right foot with unspecified severity: Secondary | ICD-10-CM | POA: Diagnosis present

## 2020-10-06 DIAGNOSIS — E11621 Type 2 diabetes mellitus with foot ulcer: Secondary | ICD-10-CM | POA: Diagnosis present

## 2020-10-06 DIAGNOSIS — I5040 Unspecified combined systolic (congestive) and diastolic (congestive) heart failure: Secondary | ICD-10-CM | POA: Diagnosis present

## 2020-10-06 DIAGNOSIS — Z8249 Family history of ischemic heart disease and other diseases of the circulatory system: Secondary | ICD-10-CM

## 2020-10-06 DIAGNOSIS — F32A Depression, unspecified: Secondary | ICD-10-CM | POA: Diagnosis present

## 2020-10-06 DIAGNOSIS — E739 Lactose intolerance, unspecified: Secondary | ICD-10-CM | POA: Diagnosis present

## 2020-10-06 DIAGNOSIS — D519 Vitamin B12 deficiency anemia, unspecified: Secondary | ICD-10-CM | POA: Diagnosis not present

## 2020-10-06 DIAGNOSIS — Z79891 Long term (current) use of opiate analgesic: Secondary | ICD-10-CM

## 2020-10-06 DIAGNOSIS — E876 Hypokalemia: Secondary | ICD-10-CM | POA: Diagnosis not present

## 2020-10-06 DIAGNOSIS — E1142 Type 2 diabetes mellitus with diabetic polyneuropathy: Secondary | ICD-10-CM | POA: Diagnosis present

## 2020-10-06 DIAGNOSIS — Z6825 Body mass index (BMI) 25.0-25.9, adult: Secondary | ICD-10-CM

## 2020-10-06 DIAGNOSIS — F17210 Nicotine dependence, cigarettes, uncomplicated: Secondary | ICD-10-CM | POA: Diagnosis present

## 2020-10-06 DIAGNOSIS — Z89512 Acquired absence of left leg below knee: Secondary | ICD-10-CM

## 2020-10-06 DIAGNOSIS — Z9114 Patient's other noncompliance with medication regimen: Secondary | ICD-10-CM

## 2020-10-06 DIAGNOSIS — Z83438 Family history of other disorder of lipoprotein metabolism and other lipidemia: Secondary | ICD-10-CM

## 2020-10-06 DIAGNOSIS — D649 Anemia, unspecified: Secondary | ICD-10-CM | POA: Diagnosis not present

## 2020-10-06 DIAGNOSIS — M79671 Pain in right foot: Secondary | ICD-10-CM | POA: Diagnosis present

## 2020-10-06 LAB — CBC WITH DIFFERENTIAL/PLATELET
Abs Immature Granulocytes: 0.05 10*3/uL (ref 0.00–0.07)
Basophils Absolute: 0.1 10*3/uL (ref 0.0–0.1)
Basophils Relative: 1 %
Eosinophils Absolute: 0.1 10*3/uL (ref 0.0–0.5)
Eosinophils Relative: 1 %
HCT: 29.1 % — ABNORMAL LOW (ref 36.0–46.0)
Hemoglobin: 8.5 g/dL — ABNORMAL LOW (ref 12.0–15.0)
Immature Granulocytes: 1 %
Lymphocytes Relative: 10 %
Lymphs Abs: 0.8 10*3/uL (ref 0.7–4.0)
MCH: 24.9 pg — ABNORMAL LOW (ref 26.0–34.0)
MCHC: 29.2 g/dL — ABNORMAL LOW (ref 30.0–36.0)
MCV: 85.1 fL (ref 80.0–100.0)
Monocytes Absolute: 0.4 10*3/uL (ref 0.1–1.0)
Monocytes Relative: 4 %
Neutro Abs: 7.1 10*3/uL (ref 1.7–7.7)
Neutrophils Relative %: 83 %
Platelets: 285 10*3/uL (ref 150–400)
RBC: 3.42 MIL/uL — ABNORMAL LOW (ref 3.87–5.11)
RDW: 15.8 % — ABNORMAL HIGH (ref 11.5–15.5)
WBC: 8.4 10*3/uL (ref 4.0–10.5)
nRBC: 0 % (ref 0.0–0.2)

## 2020-10-06 LAB — COMPREHENSIVE METABOLIC PANEL
ALT: 8 U/L (ref 0–44)
AST: 9 U/L — ABNORMAL LOW (ref 15–41)
Albumin: 2.4 g/dL — ABNORMAL LOW (ref 3.5–5.0)
Alkaline Phosphatase: 95 U/L (ref 38–126)
Anion gap: 12 (ref 5–15)
BUN: 37 mg/dL — ABNORMAL HIGH (ref 6–20)
CO2: 19 mmol/L — ABNORMAL LOW (ref 22–32)
Calcium: 8 mg/dL — ABNORMAL LOW (ref 8.9–10.3)
Chloride: 101 mmol/L (ref 98–111)
Creatinine, Ser: 2.42 mg/dL — ABNORMAL HIGH (ref 0.44–1.00)
GFR, Estimated: 23 mL/min — ABNORMAL LOW (ref >60)
Glucose, Bld: 169 mg/dL — ABNORMAL HIGH (ref 70–99)
Potassium: 3.7 mmol/L (ref 3.5–5.1)
Sodium: 132 mmol/L — ABNORMAL LOW (ref 135–145)
Total Bilirubin: 0.5 mg/dL (ref 0.3–1.2)
Total Protein: 6.7 g/dL (ref 6.5–8.1)

## 2020-10-06 LAB — TYPE AND SCREEN
ABO/RH(D): O NEG
Antibody Screen: NEGATIVE

## 2020-10-06 LAB — CBG MONITORING, ED: Glucose-Capillary: 104 mg/dL — ABNORMAL HIGH (ref 70–99)

## 2020-10-06 LAB — LACTIC ACID, PLASMA: Lactic Acid, Venous: 0.9 mmol/L (ref 0.5–1.9)

## 2020-10-06 LAB — RESPIRATORY PANEL BY RT PCR (FLU A&B, COVID)
Influenza A by PCR: NEGATIVE
Influenza B by PCR: NEGATIVE
SARS Coronavirus 2 by RT PCR: NEGATIVE

## 2020-10-06 MED ORDER — SODIUM CHLORIDE 0.9 % IV SOLN: INTRAVENOUS | Status: AC

## 2020-10-06 MED ORDER — SODIUM CHLORIDE 0.9 % IV SOLN
2.0000 g | INTRAVENOUS | Status: DC
  Administered 2020-10-07 – 2020-10-08 (×2): 2 g via INTRAVENOUS
  Filled 2020-10-06 (×4): qty 2

## 2020-10-06 MED ORDER — METRONIDAZOLE IN NACL 5-0.79 MG/ML-% IV SOLN: 500.0000 mg | Freq: Three times a day (TID) | INTRAVENOUS | Status: DC

## 2020-10-06 MED ORDER — VANCOMYCIN HCL 1500 MG/300ML IV SOLN
1500.0000 mg | Freq: Once | INTRAVENOUS | Status: AC
  Administered 2020-10-06: 1500 mg via INTRAVENOUS
  Filled 2020-10-06: qty 300

## 2020-10-06 MED ORDER — VANCOMYCIN HCL IN DEXTROSE 1-5 GM/200ML-% IV SOLN: 1000.0000 mg | Freq: Once | INTRAVENOUS | Status: DC

## 2020-10-06 MED ORDER — INSULIN ASPART 100 UNIT/ML ~~LOC~~ SOLN
0.0000 [IU] | Freq: Three times a day (TID) | SUBCUTANEOUS | Status: DC
  Administered 2020-10-07: 3 [IU] via SUBCUTANEOUS
  Administered 2020-10-08: 1 [IU] via SUBCUTANEOUS
  Administered 2020-10-08 (×2): 2 [IU] via SUBCUTANEOUS
  Administered 2020-10-09 – 2020-10-10 (×2): 1 [IU] via SUBCUTANEOUS

## 2020-10-06 MED ORDER — METOPROLOL TARTRATE 25 MG PO TABS
12.5000 mg | ORAL_TABLET | Freq: Two times a day (BID) | ORAL | Status: DC
  Filled 2020-10-06: qty 1

## 2020-10-06 MED ORDER — PREGABALIN 25 MG PO CAPS
50.0000 mg | ORAL_CAPSULE | Freq: Two times a day (BID) | ORAL | Status: DC
  Administered 2020-10-07 – 2020-10-09 (×5): 50 mg via ORAL
  Filled 2020-10-06 (×5): qty 2

## 2020-10-06 MED ORDER — SODIUM CHLORIDE 0.9 % IV SOLN
1.0000 g | Freq: Once | INTRAVENOUS | Status: DC
  Filled 2020-10-06: qty 1

## 2020-10-06 MED ORDER — METOPROLOL TARTRATE 25 MG PO TABS: 12.5000 mg | ORAL_TABLET | Freq: Two times a day (BID) | ORAL | Status: DC

## 2020-10-06 MED ORDER — INSULIN ASPART 100 UNIT/ML ~~LOC~~ SOLN
0.0000 [IU] | Freq: Every day | SUBCUTANEOUS | Status: DC
  Administered 2020-10-07: 2 [IU] via SUBCUTANEOUS

## 2020-10-06 MED ORDER — SODIUM CHLORIDE 0.9 % IV SOLN
2.0000 g | Freq: Once | INTRAVENOUS | Status: AC
  Administered 2020-10-06: 2 g via INTRAVENOUS
  Filled 2020-10-06: qty 2

## 2020-10-06 MED ORDER — ROSUVASTATIN CALCIUM 20 MG PO TABS
20.0000 mg | ORAL_TABLET | Freq: Every day | ORAL | Status: DC
  Administered 2020-10-07 – 2020-10-10 (×4): 20 mg via ORAL
  Filled 2020-10-06 (×4): qty 1

## 2020-10-06 MED ORDER — FERROUS SULFATE 325 (65 FE) MG PO TABS
325.0000 mg | ORAL_TABLET | Freq: Two times a day (BID) | ORAL | Status: DC
  Administered 2020-10-07 – 2020-10-11 (×8): 325 mg via ORAL
  Filled 2020-10-06 (×8): qty 1

## 2020-10-06 MED ORDER — ACETAMINOPHEN 325 MG PO TABS
650.0000 mg | ORAL_TABLET | Freq: Once | ORAL | Status: AC | PRN
  Administered 2020-10-06: 650 mg via ORAL
  Filled 2020-10-06: qty 2

## 2020-10-06 MED ORDER — OXYCODONE HCL 5 MG PO TABS
5.0000 mg | ORAL_TABLET | ORAL | Status: DC | PRN
  Administered 2020-10-07: 5 mg via ORAL
  Filled 2020-10-06: qty 1

## 2020-10-06 MED ORDER — VANCOMYCIN HCL 1250 MG/250ML IV SOLN
1250.0000 mg | INTRAVENOUS | Status: DC
  Administered 2020-10-08: 1250 mg via INTRAVENOUS
  Filled 2020-10-06: qty 250

## 2020-10-06 MED ORDER — HYDROCODONE-ACETAMINOPHEN 10-325 MG PO TABS
1.0000 | ORAL_TABLET | Freq: Once | ORAL | Status: AC
  Administered 2020-10-06: 1 via ORAL
  Filled 2020-10-06: qty 1

## 2020-10-06 MED ORDER — METRONIDAZOLE 500 MG PO TABS
500.0000 mg | ORAL_TABLET | Freq: Three times a day (TID) | ORAL | Status: DC
  Administered 2020-10-07 – 2020-10-09 (×6): 500 mg via ORAL
  Filled 2020-10-06 (×6): qty 1

## 2020-10-06 MED ORDER — AMITRIPTYLINE HCL 50 MG PO TABS
25.0000 mg | ORAL_TABLET | Freq: Every day | ORAL | Status: DC
  Administered 2020-10-07 – 2020-10-10 (×5): 25 mg via ORAL
  Filled 2020-10-06 (×6): qty 1

## 2020-10-06 MED ORDER — PANTOPRAZOLE SODIUM 40 MG PO TBEC
40.0000 mg | DELAYED_RELEASE_TABLET | Freq: Every day | ORAL | Status: DC
  Administered 2020-10-07 – 2020-10-11 (×5): 40 mg via ORAL
  Filled 2020-10-06 (×5): qty 1

## 2020-10-06 NOTE — Progress Notes (Signed)
Pharmacy Antibiotic Note  [name] is a 54 y.o. female admitted on 10/06/2020 with Osteomyelitis.  Pharmacy has been consulted for Cefepime and Vancomycin dosing.   Height: 5\' 7"  (170.2 cm) Weight: 72.6 kg (160 lb) IBW/kg (Calculated) : 61.6  Temp (24hrs), Avg:99.7 F (37.6 C), Min:98.8 F (37.1 C), Max:100.9 F (38.3 C)  Recent Labs  Lab 10/06/20 1343 10/06/20 2056  WBC 8.4  --   CREATININE 2.42*  --   LATICACIDVEN  --  0.9    Estimated Creatinine Clearance: 25.2 mL/min (A) (by C-G formula based on SCr of 2.42 mg/dL (H)).    Allergies  Allergen Reactions  . Lactose Intolerance (Gi) Diarrhea    Antimicrobials this admission: 11/19 Cefepime >>  11/19 Vancomycin >>   Dose adjustments this admission: N/a  Microbiology results: Pending   Plan:  - Cefepime 2g IV q24h - Vancomycin 1500mg  IV x 1 dose  - Followed by Vancomycin 1250mg  IV q48h - Goal trough ~ 15 - Monitor patients renal function and urine output  - De-escalate ABX when appropriate   Thank you for allowing pharmacy to be a part of this patient's care.  Duanne Limerick PharmD. BCPS 10/06/2020 10:21 PM

## 2020-10-06 NOTE — H&P (Addendum)
History and Physical  [name]:[id number] DOB: 24-Nov-1965 DOA: 10/06/2020  Referring physician: Dr. Darl Householder, EDP PCP: Frazier Richards, MD  Outpatient Specialists: Cardiology Patient coming from: Home.  Chief Complaint: Right foot pain.  HPI: [name] is a 54 y.o. female with medical history significant for type 2 diabetes, diabetic polyneuropathy, GERD, hyperlipidemia, paroxysmal A. fib, peripheral vascular disease post left above-the-knee amputation, ischemic cardiomyopathy who presented to [facility] ED due to worsening right foot ulcer associated with foul-smelling odor.  Wound present since the summer.  Her husband who is EMT has been taking care of her wound.  She came to the ED because of developing foul odor.  She endorses occasional shooting pain in her right foot.  Admits to chills with no fevers at home.  Work-up in the ED revealed right foot osteomyelitis seen on x-ray.  EDP consulted orthopedic surgery Dr. Lucia Gaskins, planning for surgical procedure in the morning.  TRH, hospitalist team, asked to admit.  ED Course: T-max 100.9, soft BPs.  Lab studies remarkable for hemoglobin of 8.5K, previous labs showing hemoglobin of 13.7K in 2019.  Serum sodium 132, BUN 37, serum bicarb 19, creatinine 2.42 with GFR of 23.  Review of Systems: Review of systems as noted in the HPI. All other systems reviewed and are negative.   Past Medical History:  Diagnosis Date  . Abnormal stress test    a. 02/2017 MV: large region of fixed perfusion defect in basal to mid inf, mid-dist inflat walls, EF 43%. No ischemia (EF 55-60% by f/u echo).  . Arthritis   . Asthma   . Carotid arterial disease ([place])    a. 09/2017 Carotid U/S: 40-49% bilat ICA stenosis.  . Chronic back pain   . Coronary artery calcification seen on CT scan    a. 11/2017 CT Abd/Pelvis: Multi vessel coronary vascular Ca2+.  . Depression   . Diabetes mellitus   . Diabetic neuropathy ([place])   . Difficult intubation    DIFFICULT  AIRWAY/FYI  . Family history of adverse reaction to anesthesia    mother had difficlty waking   . Femoral-popliteal bypass graft occlusion, left ([place]) 12/02/2017  . GERD (gastroesophageal reflux disease)   . History of echocardiogram    a. 03/2017 Echo: EF 55-60%, mild LVH, nl RV fxn.  . Hyperlipidemia   . Ischemic cardiomyopathy    a. 04/2018 Echo: EF 30-35%, Gr2 DD, mild LVH. Mild MR. Mildly dil LA, mod dil RV w/ mod red RV fxn, mild TR, PASP 20mHg.  . NSTEMI (non-ST elevated myocardial infarction) ([place]    a. 05/2018 in setting of Afib, sepsis, and post-op L AKA. Peak trop 7.7. EF 30-35% by echo-->cath not performed 2/2 renal failure.  . Osteomyelitis of right fibula ([place] 03/05/2017  . PAD (peripheral artery disease) ([place]    a. S/p L fem-pop bypass; b. 11/2017 s/p Aortobifem bypass 2/2 graft occlusion; c. 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  . Paroxysmal atrial fibrillation with rapid ventricular response ([place] 12/02/2017   a. CHA2DS2VASc = 3-->Xarelto; b. 05/2018 Recurrent Afib-->amio.  .Marland KitchenUlcer    Foot   Past Surgical History:  Procedure Laterality Date  . ABDOMINAL AORTAGRAM  June 15, 2014  . ABDOMINAL AORTAGRAM N/A 06/15/2014   Procedure: ABDOMINAL AMaxcine Ham  Surgeon: VSerafina Mitchell MD;  [facility];  Service: Cardiovascular;  Laterality: N/A;  . ABDOMINAL AORTAGRAM N/A 11/22/2014   Procedure: ABDOMINAL AORTAGRAM;  Surgeon: VSerafina Mitchell MD;  Location: [place] CATH LAB;  Service: Cardiovascular;  Laterality: N/A;  . ABDOMINAL AORTOGRAM W/LOWER EXTREMITY N/A 01/07/2017   Procedure: Abdominal Aortogram w/Lower Extremity;  Surgeon: Serafina Mitchell, MD;  Location: [place] CV LAB;  Service: Cardiovascular;  Laterality: N/A;  . ABDOMINAL AORTOGRAM W/LOWER EXTREMITY N/A 10/31/2017   Procedure: ABDOMINAL AORTOGRAM W/LOWER EXTREMITY;  Surgeon: Elam Dutch, MD;  Location: [place] CV LAB;  Service: Cardiovascular;  Laterality: N/A;  .  ABDOMINAL AORTOGRAM W/LOWER EXTREMITY N/A 03/24/2018   Procedure: ABDOMINAL AORTOGRAM W/LOWER EXTREMITY;  Surgeon: Serafina Mitchell, MD;  Location: [place] CV LAB;  Service: Cardiovascular;  Laterality: N/A;  . AMPUTATION Left 04/26/2018   Procedure: AMPUTATION ABOVE KNEE;  Surgeon: Elam Dutch, MD;  Location: [place];  Service: Vascular;  Laterality: Left;  . AORTA - BILATERAL FEMORAL ARTERY BYPASS GRAFT N/A 11/28/2017   Procedure: AORTA BIFEMORAL BYPASS USING HEMASHIELD GOLD GRAFT & REIMPLANT IMA;  Surgeon: Serafina Mitchell, MD;  [facility];  Service: Vascular;  Laterality: N/A;  . AORTIC ARCH ANGIOGRAPHY N/A 10/31/2017   Procedure: AORTIC ARCH ANGIOGRAPHY;  Surgeon: Elam Dutch, MD;  Location: [place] CV LAB;  Service: Cardiovascular;  Laterality: N/A;  . APPLICATION OF WOUND VAC  11/28/2017   Procedure: APPLICATION OF WOUND VAC;  Surgeon: Serafina Mitchell, MD;  Location: [facility] OR;  Service: Vascular;;  . APPLICATION OF WOUND VAC Left 03/27/2018   Procedure: APPLICATION OF WOUND VAC LEFT GROIN USING PREVENA PLUS;  Surgeon: Serafina Mitchell, MD;  Location: [place];  Service: Vascular;  Laterality: Left;  . ARTERIAL BYPASS SURGERY   07/05/2010   Right Common Femoral to below knee popliteal BPG  . BACK SURGERY     X's  2  . CARDIAC CATHETERIZATION    . CHOLECYSTECTOMY     Gall Bladder  . CYSTECTOMY Left    wrist  . EMBOLECTOMY Left 11/28/2017   Procedure: Left Lower Extremity Embolectomy, Left Tibial Peroneal Trunk Endarterectomy with Patch Angioplasty; Vein Harvest Small Saphenous Graft Left Lower Leg;  Surgeon: [name], MD;  Location: [place];  Service: Vascular;  Laterality: Left;  . EYE SURGERY Left 02/23/2020   Dr. Zadie Rhine  . FEMORAL-POPLITEAL BYPASS GRAFT Left 03/27/2018   Procedure: THROMBECTOMY OF LEFT FEMORAL TIBIAL BYPASS;  Surgeon: Serafina Mitchell, MD;  Location: [place];  Service: Vascular;  Laterality: Left;  . FEMORAL-TIBIAL BYPASS GRAFT Left 03/27/2018    Procedure: BYPASS GRAFT FEMORAL-TIBIAL ARTERY LEFT REDO USING CRYOPRESERVED SAPHENOUS VEIN 70cm;  Surgeon: Serafina Mitchell, MD;  [facility];  Service: Vascular;  Laterality: Left;  . INTERCOSTAL NERVE BLOCK  November 2015  . INTRAOPERATIVE ARTERIOGRAM  11/28/2017   Procedure: INTRA OPERATIVE ARTERIOGRAM OF LEFT LEG;  Surgeon: Serafina Mitchell, MD;  Location: [place];  Service: Vascular;;  . INTRAOPERATIVE ARTERIOGRAM Left 03/27/2018   Procedure: INTRA OPERATIVE ARTERIOGRAM TIMES TWO;  Surgeon: Serafina Mitchell, MD;  Location: [place];  Service: Vascular;  Laterality: Left;  . IR FLUORO GUIDE CV LINE RIGHT  03/20/2017  . IR FLUORO GUIDE CV LINE RIGHT  05/05/2018  . IR REMOVAL TUN CV CATH W/O FL  05/20/2018  . IR US GUIDE VASC ACCESS RIGHT  03/20/2017  . IR US GUIDE VASC ACCESS RIGHT  05/05/2018  . IRRIGATION AND DEBRIDEMENT BUTTOCKS Right 09/30/2016   Procedure: DEBRIDEMENT RIGHT  BUTTOCK WOUND;  Surgeon: Georganna Skeans, MD;  Location: [place];  Service: General;  Laterality: Right;  . left foot  surgery    . PERIPHERAL VASCULAR CATHETERIZATION N/A 05/07/2016   Procedure: Abdominal Aortogram;  Surgeon: Serafina Mitchell, MD;  Location: [place] CV LAB;  Service: Cardiovascular;  Laterality: N/A;  . PERIPHERAL VASCULAR CATHETERIZATION N/A 05/07/2016   Procedure: Lower Extremity Angiography;  Surgeon: Serafina Mitchell, MD;  Location: [place] CV LAB;  Service: Cardiovascular;  Laterality: N/A;  . PERIPHERAL VASCULAR CATHETERIZATION N/A 05/07/2016   Procedure: Aortic Arch Angiography;  Surgeon: Serafina Mitchell, MD;  Location: [place] CV LAB;  Service: Cardiovascular;  Laterality: N/A;  . PERIPHERAL VASCULAR CATHETERIZATION N/A 05/07/2016   Procedure: Upper Extremity Angiography;  Surgeon: Serafina Mitchell, MD;  Location: [place] CV LAB;  Service: Cardiovascular;  Laterality: N/A;  . PERIPHERAL VASCULAR CATHETERIZATION Right 05/07/2016   Procedure: Peripheral Vascular Balloon Angioplasty;  Surgeon:  Serafina Mitchell, MD;  Location: [place] CV LAB;  Service: Cardiovascular;  Laterality: Right;  subclavian  . PERIPHERAL VASCULAR CATHETERIZATION Right 05/07/2016   Procedure: Peripheral Vascular Intervention;  Surgeon: Serafina Mitchell, MD;  Location: [place] CV LAB;  Service: Cardiovascular;  Laterality: Right;  External  Iliac  . SKIN GRAFT Right 2012   [name] by Dr. Nils Pyle- Right and Left Ankle  . SPINE SURGERY    . THROMBECTOMY FEMORAL ARTERY  11/28/2017   Procedure: THROMBECTOMY  & REVISION OF BILATERAL FEMORAL TO POPLETEAL ARTERIES;  Surgeon: Serafina Mitchell, MD;  Location: [place];  Service: Vascular;;  . TONSILLECTOMY      Social History:  reports that she has been smoking cigarettes. She has a 30.00 pack-year smoking history. She has never used smokeless tobacco. She reports that she does not drink alcohol and does not use drugs.   Allergies  Allergen Reactions  . Lactose Intolerance ([place])     Family History  Problem Relation Age of Onset  . Coronary artery disease Mother   . Peripheral vascular disease Mother   . Heart disease Mother        Before age 61  . Other Mother        Venous insuffiency  . Diabetes Mother   . Hyperlipidemia Mother   . Hypertension Mother   . Varicose Veins Mother   . Heart attack Mother        before age 51  . Heart disease Father   . Diabetes Father   . Diabetes Maternal Grandmother   . Diabetes Paternal Grandmother   . Diabetes Paternal Grandfather   . Diabetes Sister   . Hypertension Sister   . Diabetes Brother   . Hypertension Brother      Prior to Admission medications   Medication Sig Start Date End Date Taking? Authorizing Provider  albuterol (PROVENTIL) 2 MG tablet Take 2 mg by mouth 3 (three) times daily as needed for shortness of breath.     [provider]  amitriptyline (ELAVIL) 25 MG tablet TAKE 1 TABLET BY MOUTH AT BEDTIME FOR LEG PAIN 06/25/18   [provider]  cetirizine (ZYRTEC) 10 MG tablet Take 10  mg by mouth daily.     [provider]  diphenoxylate-atropine (LOMOTIL) 2.5-0.025 MG tablet Take 1 tablet by mouth 4 (four) times daily as needed for diarrhea or loose stools. 06/03/18   Love, Ivan Anchors, PA-C  ferrous sulfate 325 (65 FE) MG tablet Take 1 tablet (325 mg total) by mouth 2 (two) times daily with a meal. 05/22/18 01/22/19  Arrien, Jimmy Picket, MD  furosemide (LASIX) 40 MG tablet Take  1 tablet (40 mg total) by mouth daily for 30 days. 01/25/19 02/24/19  Dunn, Areta Haber, PA-C  HYDROcodone-acetaminophen (NORCO) 10-325 MG tablet LIMIT 1 2 TO 1 (ONE HALF TO ONE) TABLET BY MOUTH PER DAY OR 2 4 TIMES DAILY IF TOLERATED. NO OXYCODONE. 07/14/18   [provider]  insulin glargine (LANTUS) 100 UNIT/ML injection Inject 0.18 mLs (18 Units total) into the skin 2 (two) times daily. 06/03/18   Love, Ivan Anchors, PA-C  insulin lispro (HUMALOG) 100 UNIT/ML injection For glucose 121-150 use TWO units,  for 151-200 use FOUR units, for 201-250 use SIX units, for 251-300 use EIGHT units, for 301-350 use TEN units. For blood sugars greater than 350 call MD. 06/03/18   Love, Ivan Anchors, PA-C  loperamide (IMODIUM A-D) 2 MG tablet Take 12 mg by mouth daily as needed for diarrhea or loose stools.    [provider]  metoprolol tartrate (LOPRESSOR) 25 MG tablet Take 0.5 tablets (12.5 mg total) by mouth 2 (two) times daily. 07/21/18   Theora Gianotti, NP  nitroGLYCERIN (NITROSTAT) 0.4 MG SL tablet Place 1 tablet (0.4 mg total) under the tongue every 5 (five) minutes as needed for chest pain. 06/03/18   Love, Ivan Anchors, PA-C  omeprazole (PRILOSEC) 20 MG capsule Take 20 mg by mouth daily.    [provider]  ondansetron (ZOFRAN-ODT) 4 MG disintegrating tablet DISSOLVE 1 TABLET IN MOUTH EVERY 8 HOURS AS NEEDED FOR NAUSEA OR VOMITING 04/02/18   [name], MD  pregabalin (LYRICA) 50 MG capsule Take 1 capsule (50 mg total) by mouth 2 (two) times daily. 06/03/18   Love, Ivan Anchors, PA-C   Rivaroxaban (XARELTO) 15 MG TABS tablet Take 1 tablet (15 mg total) by mouth daily with supper. 07/21/18   Theora Gianotti, NP  rosuvastatin (CRESTOR) 20 MG tablet Take 1 tablet (20 mg total) by mouth daily at 6 PM. 07/23/19   Theora Gianotti, NP    Physical Exam: BP 97/63   Pulse 78   Temp 98.8 F (37.1 C) (Oral)   Resp 15   Ht _0  (1.702 m)   SpO2 95%   BMI 25.14 kg/m   . General: 54 y.o. year-old female well developed well nourished in no acute distress.  Alert and oriented x3. . Cardiovascular: Regular rate and rhythm with no rubs or gallops.  No thyromegaly or JVD noted.   Marland Kitchen Respiratory: Clear to auscultation with no wheezes or rales. Good inspiratory effort. . Abdomen: Soft nontender nondistended with normal bowel sounds x4 quadrants. . Muskuloskeletal: No cyanosis, clubbing or edema noted bilaterally . Neuro: CN II-XII intact, strength, sensation, reflexes . Skin:         . Psychiatry: Judgement and insight appear normal. Mood is appropriate for condition and setting          Labs on Admission:  Basic Metabolic Panel: Recent Labs  Lab 10/06/20 1343  NA 132*  K 3.7  CL 101  CO2 19*  GLUCOSE 169*  BUN 37*  CREATININE 2.42*  CALCIUM 8.0*   Liver Function Tests: Recent Labs  Lab 10/06/20 1343  AST 9*  ALT 8  ALKPHOS 95  BILITOT 0.5  PROT 6.7  ALBUMIN 2.4*   No results for input(s): LIPASE, AMYLASE in the last 168 hours. No results for input(s): AMMONIA in the last 168 hours. CBC: Recent Labs  Lab 10/06/20 1343  WBC 8.4  NEUTROABS 7.1  HGB 8.5*  HCT 29.1*  MCV 85.1  PLT 285   Cardiac Enzymes: No results for input(s): CKTOTAL, CKMB, CKMBINDEX, TROPONINI in the last 168 hours.  BNP (last 3 results) No results for input(s): BNP in the last 8760 hours.  ProBNP (last 3 results) No results for input(s): PROBNP in the last 8760 hours.  CBG: No results for input(s): GLUCAP in the last 168 hours.  Radiological Exams on  Admission: DG Foot Complete Right  Result Date: 10/06/2020 CLINICAL DATA:  Right foot fell over and pain EXAM: RIGHT FOOT COMPLETE - 3+ VIEW COMPARISON:  None. FINDINGS: There is an obliquely oriented mildly displaced fracture seen through the mid first metatarsal. Large area of ulceration overlying the first metatarsal with subcutaneous emphysema and soft tissue swelling. There is area of mottled cortical irregularity seen throughout the first metatarsal head and phalanges. Significant soft tissue swelling seen diffusely around the the dorsum of the foot. IMPRESSION: Mildly displaced fracture seen through the first metatarsal. large area of overlying ulceration and findings that could be suggestive of osteomyelitis. Electronically Signed   By: Prudencio Pair M.D.   On: 10/06/2020 20:38    EKG: I independently viewed the EKG done and my findings are as followed: None available at the time of this visit.  Assessment/Plan Present on Admission: . Foot osteomyelitis, right ([place])  Active Problems:   Foot osteomyelitis, right ([facility])  Right foot osteomyelitis, affecting right great toe, seen on x-ray Obtain blood cultures peripherally x2, follow Received IV vancomycin and cefepime in the ED, continue Add po Flagyl, national shortage of IV Flagyl. T-max 100.9 in the ED No leukocytosis, no tachycardia or tachypnea, does not meet SIRS criteria at the time of this visit. Monitor vital signs, fever curve and WBC CBC, ESR, CRP in the morning Orthopedic surgery Dr. Lucia Gaskins has been consulted by EDP, possible amputation in the a.m. Hold off home Xarelto N.p.o. after midnight  Acute on chronic blood loss anemia in the setting of chronic wound/iron deficiency anemia Hemoglobin 13K in 2019 Presented with hemoglobin of 8.7K with MCV of 85 Continue home iron supplement Obtain type and screen  AKI on CKD 3B Baseline creatinine appears to be 1.6 with GFR of 36 Presented with creatinine of 2.42 with GFR  23 Start gentle IV fluid hydration Hold off home Lasix Avoid nephrotoxins, hypotension and dehydration Monitor urine output Obtain daily renal panel  Type 2 diabetes with hyperglycemia Obtain A1c Start insulin sliding scale  Diabetic polyneuropathy Resume home regimen  Essential hypertension Blood pressures are currently soft Restart home p.o. Lopressor to avoid beta-blocker withdrawal Monitor vital signs  Paroxysmal A. Fib Currently rate controlled on p.o. Lopressor Restart home metoprolol to avoid withdrawal Xarelto on hold due to possible orthopedic surgery in the morning. Closely monitor BP while on home metoprolol  Hyperlipidemia Resume home statin  Peripheral vascular disease status post left above-the-knee amputation Continue home statin  Ambulatory dysfunction PT OT to assess and to follow podiatry's recommendations Fall precautions     DVT prophylaxis: Xarelto held due to possible amputation in the AM  Code Status: Full code per the patient herself.  Family Communication: None at bedside.  Disposition Plan: Admit to telemetry medical.  Consults called: Orthopedic surgery Dr. Lucia Gaskins.  Admission status: Inpatient status, patient will require at least 2 midnights for further evaluation and treatment of present condition.   Status is: Inpatient   Dispo:  Patient From: Home  Planned Disposition: Home with Health Care Svc  Expected discharge date: 10/09/20  Medically stable for discharge: No, ongoing  management of right foot osteomyelitis with IV antibiotics, AKI on CKD 3B with IV fluids.        Kayleen Memos MD [name] Pager [phone]  If 7PM-7AM, please contact night-coverage [email or web address] Password [facility]  10/06/2020, 9:48 PM

## 2020-10-06 NOTE — ED Triage Notes (Signed)
Pt reports ongoing pain to R foot and foul odor x1 week. States she has had an ulcer on her foot for a while and it has not been properly treated.
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
## 2020-10-06 NOTE — Consult Note (Signed)
Brief orthopedic consult note: Full consult to follow  I was consulted about patient [name].  She has uncontrolled diabetes with neuropathy.  She presented with a longstanding right foot ulceration and x-ray evidence of osteomyelitis of the great toe and first metatarsal with fracture.  Imaging in the chart per ED physician demonstrates soft tissue compromise to the medial aspect of the foot with swelling, erythema and evidence of gangrene.  Patient has a history of contralateral above-the-knee amputation.  Patient will need medial ray resection versus transmetatarsal amputation versus below the knee amputation based on available viable soft tissue for coverage.  We will plan for surgery in the a.m.  Keep patient n.p.o. past midnight.

## 2020-10-06 NOTE — ED Provider Notes (Signed)
-++- [facility] EMERGENCY DEPARTMENT Provider Note   CSN: [id number] Arrival date & time: 10/06/20  1317     History Chief Complaint  Patient presents with  . Foot Pain    [name] is a 54 y.o. female with past medical history of diabetes with neuropathy, GERD, hyperlipidemia, A. fib, PAD, ischemic cardiomyopathy and left AKA who presents to the ED for assessment of foot ulcer. Patient states she developed a small ulcer on the right foot over the summer. Since then the ulcer has gradually worsened.  Her husband who is a EMT takes care of her wound and usually wraps it. However, he has neglected taking care of her and wrapped it only few times in September. According to patient, has been only decided to bring her to the ER because the ulcer had developed a foul odor.  Patient states she is legally blind and has neuropathy in her foot so she does not have any feeling in that foot but does experience occasional shooting pains from the foot.  Patient endorse some chills but denies any shortness of breath, chest pain, leg pain, palpitations, headaches, dizziness, N/V or tingling sensation.   Of note, patient received left AKA 2 years ago due to occluded bypass graft and ischemic changes in her left limb    Past Medical History:  Diagnosis Date  . Abnormal stress test    a. 02/2017 MV: large region of fixed perfusion defect in basal to mid inf, mid-dist inflat walls, EF 43%. No ischemia (EF 55-60% by f/u echo).  . Arthritis   . Asthma   . Carotid arterial disease ([place])    a. 09/2017 Carotid U/S: 40-49% bilat ICA stenosis.  . Chronic back pain   . Coronary artery calcification seen on CT scan    a. 11/2017 CT Abd/Pelvis: Multi vessel coronary vascular Ca2+.  . Depression   . Diabetes mellitus   . Diabetic neuropathy ([place])   . Difficult intubation    DIFFICULT AIRWAY/FYI  . Family history of adverse reaction to anesthesia    mother had difficlty waking   .  Femoral-popliteal bypass graft occlusion, left ([place]) 12/02/2017  . GERD (gastroesophageal reflux disease)   . History of echocardiogram    a. 03/2017 Echo: EF 55-60%, mild LVH, nl RV fxn.  . Hyperlipidemia   . Ischemic cardiomyopathy    a. 04/2018 Echo: EF 30-35%, Gr2 DD, mild LVH. Mild MR. Mildly dil LA, mod dil RV w/ mod red RV fxn, mild TR, PASP 26mmHg.  . NSTEMI (non-ST elevated myocardial infarction) ([place])    a. 05/2018 in setting of Afib, sepsis, and post-op L AKA. Peak trop 7.7. EF 30-35% by echo-->cath not performed 2/2 renal failure.  . 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 PAD (peripheral artery disease) ([place])    a. S/p L fem-pop bypass; b. 11/2017 s/p Aortobifem bypass 2/2 graft occlusion; c. 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  . Paroxysmal atrial fibrillation with rapid ventricular response ([place]) 12/02/2017   a. CHA2DS2VASc = 3-->Xarelto; b. 05/2018 Recurrent Afib-->amio.  Marland Kitchen Ulcer    Foot    Patient Active Problem List   Diagnosis Date Noted  . Stable treated proliferative diabetic retinopathy of left eye determined by examination associated with type 2 diabetes mellitus ([place]) 08/14/2020  . Controlled type 2 diabetes mellitus with left eye affected by proliferative retinopathy with traction retinal detachment not involving macula, with long-term current use of insulin ([place])  02/24/2020  . Macular pucker, left eye 02/24/2020  . Follow-up examination after eye surgery 02/24/2020  . Coronary artery disease involving native coronary artery of native heart without angina pectoris 11/21/2018  . Ischemic cardiomyopathy 11/21/2018  . Chronic systolic heart failure ([place]) 11/21/2018  . Persistent atrial fibrillation ([place]) 11/21/2018  . Chronic kidney disease, stage III (moderate) ([place]) 11/21/2018  . Essential hypertension 11/21/2018  . Abnormality of gait 07/29/2018  . Neuropathic pain   . Labile blood pressure   . Diabetic retinopathy  associated with type 2 diabetes mellitus ([place])   . Hypoalbuminemia due to protein-calorie malnutrition ([place])   . Type 2 diabetes mellitus with peripheral neuropathy (HCC)   . Chronic diastolic congestive heart failure ([place])   . Above knee amputation status, left 05/22/2018  . AKI (acute kidney injury) ([place])   . Unilateral AKA, left ([place])   . ATN (acute tubular necrosis) ([place])   . Atrial fibrillation with rapid ventricular response ([place])   . Diabetic peripheral neuropathy ([place])   . Acute blood loss anemia   . Anemia of chronic disease   . Hyperlipidemia   . Gastroesophageal reflux disease   . Non-ST elevation (NSTEMI) myocardial infarction ([place])   . Acute combined systolic and diastolic heart failure ([place])   . Acute encephalopathy   . Acute renal failure ([place])   . Acute respiratory failure ([place])   . Sepsis ([place])   . Lower limb ischemia 04/25/2018  . Microcytic anemia 04/25/2018  . Pulmonary nodule 04/25/2018  . Gangrene of lower extremity ([place]) 04/25/2018  . Femoral-popliteal bypass graft occlusion, left ([place]) 12/02/2017  . Respiratory failure with hypoxia ([place]) 12/02/2017  . Leukocytosis 12/02/2017  . Paroxysmal atrial fibrillation with rapid ventricular response ([place]) 12/02/2017  . Thrombosis 11/24/2017  . Hyperkalemia 06/26/2017  . Atherosclerosis of native artery of right lower extremity with ulceration of ankle ([place]) 05/13/2017  . Osteomyelitis of right fibula ([place]) 03/05/2017  . Non-pressure chronic ulcer of right ankle limited to breakdown of skin ([place]) 01/17/2017  . Elevated lactic acid level   . Osteomyelitis of ankle ([place]) 12/29/2016  . Chronic pain 12/24/2016  . Buttock wound, left, subsequent encounter   . Hypotension 12/23/2016  . IDDM (insulin dependent diabetes mellitus) 12/23/2016  . Acute kidney injury ([place]) 12/23/2016  . Malnutrition ([place]) 12/23/2016  . Thrush 12/23/2016  . Septic shock ([place]) 11/27/2016  . DM2 (diabetes mellitus, type 2) ([place]) 11/27/2016   . Chronic back pain 11/27/2016  . Orthostatic hypotension 11/27/2016  . Demand ischemia ([place])   . Hyperlipidemia LDL goal <70   . Right sided weakness   . Retinal tumor of right eye   . Pressure injury of skin 10/01/2016  . Necrotizing fasciitis ([place]) 09/30/2016  . 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 Paresthesia of both hands 03/05/2016  . DDD (degenerative disc disease), lumbosacral 03/26/2015  . DDD (degenerative disc disease), cervical 03/26/2015  . Sacroiliac joint disease 03/26/2015  . Occipital neuralgia 03/26/2015  . Pain in limb-Bilateral Leg 04/25/2014  . Peripheral vascular disease ([place]) 04/05/2013  . Chronic total occlusion of artery of the extremities ([place]) 02/10/2012  . Peripheral vascular disease, unspecified ([place]) 02/10/2012    Past Surgical History:  Procedure Laterality Date  . ABDOMINAL AORTAGRAM  June 15, 2014  . ABDOMINAL AORTAGRAM N/A 06/15/2014   Procedure: ABDOMINAL Maxcine Ham;  Surgeon: Serafina Mitchell, MD;  Location: [facility] CATH LAB;  Service: Cardiovascular;  Laterality: N/A;  . ABDOMINAL AORTAGRAM N/A 11/22/2014   Procedure: ABDOMINAL AORTAGRAM;  Surgeon: Serafina Mitchell, MD;  Location: [facility] CATH LAB;  Service: Cardiovascular;  Laterality: N/A;  . ABDOMINAL AORTOGRAM W/LOWER EXTREMITY N/A 01/07/2017   Procedure: Abdominal Aortogram w/Lower Extremity;  Surgeon: Serafina Mitchell, MD;  Location: [facility] CV LAB;  Service: Cardiovascular;  Laterality: N/A;  . ABDOMINAL AORTOGRAM W/LOWER EXTREMITY N/A 10/31/2017   Procedure: ABDOMINAL AORTOGRAM W/LOWER EXTREMITY;  Surgeon: Elam Dutch, MD;  Location: [facility] CV LAB;  Service: Cardiovascular;  Laterality: N/A;  . ABDOMINAL AORTOGRAM W/LOWER EXTREMITY N/A 03/24/2018   Procedure: ABDOMINAL AORTOGRAM W/LOWER EXTREMITY;  Surgeon: Serafina Mitchell, MD;  Location: [facility] CV LAB;  Service: Cardiovascular;  Laterality: N/A;  . AMPUTATION Left 04/26/2018   Procedure: AMPUTATION ABOVE KNEE;  Surgeon:  Elam Dutch, MD;  Location: [facility];  Service: Vascular;  Laterality: Left;  . AORTA - BILATERAL FEMORAL ARTERY BYPASS GRAFT N/A 11/28/2017   Procedure: AORTA BIFEMORAL BYPASS USING HEMASHIELD GOLD GRAFT & REIMPLANT IMA;  Surgeon: Serafina Mitchell, MD;  Location[facility] OR;  Service: Vascular;  Laterality: N/A;  . AORTIC ARCH ANGIOGRAPHY N/A 10/31/2017   Procedure: AORTIC ARCH ANGIOGRAPHY;  Surgeon: Elam Dutch, MD;  Location: [facility] CV LAB;  Service: Cardiovascular;  Laterality: N/A;  . APPLICATION OF WOUND VAC  11/28/2017   Procedure: APPLICATION OF WOUND VAC;  Surgeon: Serafina Mitchell, MD;  Location: [facility] OR;  Service: Vascular;;  . APPLICATION OF WOUND VAC Left 03/27/2018   Procedure: APPLICATION OF WOUND VAC LEFT GROIN USING PREVENA PLUS;  Surgeon: Serafina Mitchell, MD;  Location: [facility];  Service: Vascular;  Laterality: Left;  . ARTERIAL BYPASS SURGERY   07/05/2010   Right Common Femoral to below knee popliteal BPG  . BACK SURGERY     X's  2  . CARDIAC CATHETERIZATION    . CHOLECYSTECTOMY     Gall Bladder  . CYSTECTOMY Left    wrist  . EMBOLECTOMY Left 11/28/2017   Procedure: Left Lower Extremity Embolectomy, Left Tibial Peroneal Trunk Endarterectomy with Patch Angioplasty; Vein Harvest Small Saphenous Graft Left Lower Leg;  Surgeon: [name], MD;  Location: [facility];  Service: Vascular;  Laterality: Left;  . EYE SURGERY Left 02/23/2020   Dr. Zadie Rhine  . FEMORAL-POPLITEAL BYPASS GRAFT Left 03/27/2018   Procedure: THROMBECTOMY OF LEFT FEMORAL TIBIAL BYPASS;  Surgeon: Serafina Mitchell, MD;  Location: [facility];  Service: Vascular;  Laterality: Left;  . FEMORAL-TIBIAL BYPASS GRAFT Left 03/27/2018   Procedure: BYPASS GRAFT FEMORAL-TIBIAL ARTERY LEFT REDO USING CRYOPRESERVED SAPHENOUS VEIN 70cm;  Surgeon: Serafina Mitchell, MD;  Location[facility] OR;  Service: Vascular;  Laterality: Left;  . INTERCOSTAL NERVE BLOCK  November 2015  . INTRAOPERATIVE ARTERIOGRAM  11/28/2017   Procedure:  INTRA OPERATIVE ARTERIOGRAM OF LEFT LEG;  Surgeon: Serafina Mitchell, MD;  Location: [facility];  Service: Vascular;;  . INTRAOPERATIVE ARTERIOGRAM Left 03/27/2018   Procedure: INTRA OPERATIVE ARTERIOGRAM TIMES TWO;  Surgeon: Serafina Mitchell, MD;  Location: [facility];  Service: Vascular;  Laterality: Left;  . IR FLUORO GUIDE CV LINE RIGHT  03/20/2017  . IR FLUORO GUIDE CV LINE RIGHT  05/05/2018  . IR REMOVAL TUN CV CATH W/O FL  05/20/2018  . IR US GUIDE VASC ACCESS RIGHT  03/20/2017  . IR US GUIDE VASC ACCESS RIGHT  05/05/2018  . IRRIGATION AND DEBRIDEMENT BUTTOCKS Right 09/30/2016   Procedure: DEBRIDEMENT RIGHT  BUTTOCK WOUND;  Surgeon: Georganna Skeans, MD;  Location: [facility];  Service: General;  Laterality: Right;  . left foot surgery    . PERIPHERAL VASCULAR CATHETERIZATION N/A 05/07/2016   Procedure: Abdominal Aortogram;  Surgeon: Serafina Mitchell, MD;  Location: [facility] CV LAB;  Service: Cardiovascular;  Laterality: N/A;  . PERIPHERAL VASCULAR CATHETERIZATION N/A 05/07/2016   Procedure: Lower Extremity Angiography;  Surgeon: Serafina Mitchell, MD;  Location: [facility] CV LAB;  Service: Cardiovascular;  Laterality: N/A;  . PERIPHERAL VASCULAR CATHETERIZATION N/A 05/07/2016   Procedure: Aortic Arch Angiography;  Surgeon: Serafina Mitchell, MD;  Location: [facility] CV LAB;  Service: Cardiovascular;  Laterality: N/A;  . PERIPHERAL VASCULAR CATHETERIZATION N/A 05/07/2016   Procedure: Upper Extremity Angiography;  Surgeon: Serafina Mitchell, MD;  Location: [facility] CV LAB;  Service: Cardiovascular;  Laterality: N/A;  . PERIPHERAL VASCULAR CATHETERIZATION Right 05/07/2016   Procedure: Peripheral Vascular Balloon Angioplasty;  Surgeon: Serafina Mitchell, MD;  Location: [facility] CV LAB;  Service: Cardiovascular;  Laterality: Right;  subclavian  . PERIPHERAL VASCULAR CATHETERIZATION Right 05/07/2016   Procedure: Peripheral Vascular Intervention;  Surgeon: Serafina Mitchell, MD;  Location: [facility] CV LAB;  Service:  Cardiovascular;  Laterality: Right;  External  Iliac  . SKIN GRAFT Right 2012   [name] by Dr. Nils Pyle- Right and Left Ankle  . SPINE SURGERY    . THROMBECTOMY FEMORAL ARTERY  11/28/2017   Procedure: THROMBECTOMY  & REVISION OF BILATERAL FEMORAL TO POPLETEAL ARTERIES;  Surgeon: Serafina Mitchell, MD;  Location: [facility] OR;  Service: Vascular;;  . TONSILLECTOMY       OB History   No obstetric history on file.     Family History  Problem Relation Age of Onset  . Coronary artery disease Mother   . Peripheral vascular disease Mother   . Heart disease Mother        Before age 52  . Other Mother        Venous insuffiency  . Diabetes Mother   . Hyperlipidemia Mother   . Hypertension Mother   . Varicose Veins Mother   . Heart attack Mother        before age 80  . Heart disease Father   . Diabetes Father   . Diabetes Maternal Grandmother   . Diabetes Paternal Grandmother   . Diabetes Paternal Grandfather   . Diabetes Sister   . Hypertension Sister   . Diabetes Brother   . Hypertension Brother     Social History   Tobacco Use  . Smoking status: Current Some Day Smoker    Packs/day: 1.00    Years: 30.00    Pack years: 30.00    Types: Cigarettes    Last attempt to quit: 02/07/2017    Years since quitting: 3.6  . Smokeless tobacco: Never Used  Vaping Use  . Vaping Use: Never used  Substance Use Topics  . Alcohol use: No    Alcohol/week: 0.0 standard drinks  . Drug use: No    Home Medications Prior to Admission medications   Medication Sig Start Date End Date Taking? Authorizing Provider  albuterol (PROVENTIL) 2 MG tablet Take 2 mg by mouth 3 (three) times daily as needed for shortness of breath.     [provider]  amitriptyline (ELAVIL) 25 MG tablet TAKE 1 TABLET BY MOUTH AT BEDTIME FOR LEG PAIN 06/25/18   [provider]  cetirizine (ZYRTEC) 10 MG tablet Take 10 mg by mouth daily.     [provider]  diphenoxylate-atropine (LOMOTIL) 2.5-0.025 MG  tablet Take  1 tablet by mouth 4 (four) times daily as needed for diarrhea or loose stools. 06/03/18   Love, Ivan Anchors, PA-C  ferrous sulfate 325 (65 FE) MG tablet Take 1 tablet (325 mg total) by mouth 2 (two) times daily with a meal. 05/22/18 01/22/19  Arrien, Jimmy Picket, MD  furosemide (LASIX) 40 MG tablet Take 1 tablet (40 mg total) by mouth daily for 30 days. 01/25/19 02/24/19  Dunn, Areta Haber, PA-C  HYDROcodone-acetaminophen (NORCO) 10-325 MG tablet LIMIT 1 2 TO 1 (ONE HALF TO ONE) TABLET BY MOUTH PER DAY OR 2 4 TIMES DAILY IF TOLERATED. NO OXYCODONE. 07/14/18   [provider]  insulin glargine (LANTUS) 100 UNIT/ML injection Inject 0.18 mLs (18 Units total) into the skin 2 (two) times daily. 06/03/18   Love, Ivan Anchors, PA-C  insulin lispro (HUMALOG) 100 UNIT/ML injection For glucose 121-150 use TWO units,  for 151-200 use FOUR units, for 201-250 use SIX units, for 251-300 use EIGHT units, for 301-350 use TEN units. For blood sugars greater than 350 call MD. 06/03/18   Love, Ivan Anchors, PA-C  loperamide (IMODIUM A-D) 2 MG tablet Take 12 mg by mouth daily as needed for diarrhea or loose stools.    [provider]  metoprolol tartrate (LOPRESSOR) 25 MG tablet Take 0.5 tablets (12.5 mg total) by mouth 2 (two) times daily. 07/21/18   Theora Gianotti, NP  nitroGLYCERIN (NITROSTAT) 0.4 MG SL tablet Place 1 tablet (0.4 mg total) under the tongue every 5 (five) minutes as needed for chest pain. 06/03/18   Love, Ivan Anchors, PA-C  omeprazole (PRILOSEC) 20 MG capsule Take 20 mg by mouth daily.    [provider]  ondansetron (ZOFRAN-ODT) 4 MG disintegrating tablet DISSOLVE 1 TABLET IN MOUTH EVERY 8 HOURS AS NEEDED FOR NAUSEA OR VOMITING 04/02/18   [name], MD  pregabalin (LYRICA) 50 MG capsule Take 1 capsule (50 mg total) by mouth 2 (two) times daily. 06/03/18   Love, Ivan Anchors, PA-C  Rivaroxaban (XARELTO) 15 MG TABS tablet Take 1 tablet (15 mg total) by mouth daily with supper.  07/21/18   Theora Gianotti, NP  rosuvastatin (CRESTOR) 20 MG tablet Take 1 tablet (20 mg total) by mouth daily at 6 PM. 07/23/19   Theora Gianotti, NP    Allergies    Lactose intolerance (gi)  Review of Systems   Review of Systems  Constitutional: Positive for chills and fever.  Respiratory: Negative for shortness of breath.   Cardiovascular: Positive for leg swelling. Negative for chest pain.  Gastrointestinal: Negative for abdominal pain and nausea.  Musculoskeletal: Positive for back pain.  Skin: Positive for wound.  Neurological: Positive for numbness. Negative for weakness.  Psychiatric/Behavioral: Negative for confusion.    Physical Exam Updated Vital Signs BP 118/69   Pulse 88   Temp 98.8 F (37.1 C) (Oral)   Resp 18   Ht 5\' 7"  (1.702 m)   SpO2 98%   BMI 25.14 kg/m   Physical Exam Constitutional:      General: She is not in acute distress. HENT:     Head: Normocephalic and atraumatic.     Nose: Nose normal.  Eyes:     Extraocular Movements: Extraocular movements intact.  Cardiovascular:     Rate and Rhythm: Tachycardia present.     Pulses: Normal pulses.     Heart sounds: Normal heart sounds.  Pulmonary:     Effort: Pulmonary effort is normal.     Breath sounds:  Normal breath sounds.  Abdominal:     General: Bowel sounds are normal.     Palpations: Abdomen is soft.     Tenderness: There is no abdominal tenderness.  Musculoskeletal:        General: Normal range of motion.     Cervical back: Normal range of motion and neck supple.     Comments: Left AKA  Skin:    General: Skin is warm and dry.     Findings: Erythema and lesion present.     Comments: Right foot ulcer with moderate erythema and foul odor between the hallux and the second digit. There is an area of wet gangrene/necrosis surrounding the hallux. See media tab for photo.   Neurological:     Mental Status: She is alert and oriented to person, place, and time.     Sensory:  Sensory deficit present.     Motor: No weakness.     Comments: Absent sensation from the right foot to the mid calf.  Psychiatric:        Mood and Affect: Mood normal.     ED Results / Procedures / Treatments   Labs (all labs ordered are listed, but only abnormal results are displayed) Labs Reviewed  COMPREHENSIVE METABOLIC PANEL - Abnormal; Notable for the following components:      Result Value   Sodium 132 (*)    CO2 19 (*)    Glucose, Bld 169 (*)    BUN 37 (*)    Creatinine, Ser 2.42 (*)    Calcium 8.0 (*)    Albumin 2.4 (*)    AST 9 (*)    GFR, Estimated 23 (*)    All other components within normal limits  CBC WITH DIFFERENTIAL/PLATELET - Abnormal; Notable for the following components:   RBC 3.42 (*)    Hemoglobin 8.5 (*)    HCT 29.1 (*)    MCH 24.9 (*)    MCHC 29.2 (*)    RDW 15.8 (*)    All other components within normal limits    EKG None  Radiology DG Foot Complete Right  Result Date: 10/06/2020 CLINICAL DATA:  Right foot fell over and pain EXAM: RIGHT FOOT COMPLETE - 3+ VIEW COMPARISON:  None. FINDINGS: There is an obliquely oriented mildly displaced fracture seen through the mid first metatarsal. Large area of ulceration overlying the first metatarsal with subcutaneous emphysema and soft tissue swelling. There is area of mottled cortical irregularity seen throughout the first metatarsal head and phalanges. Significant soft tissue swelling seen diffusely around the the dorsum of the foot. IMPRESSION: Mildly displaced fracture seen through the first metatarsal. large area of overlying ulceration and findings that could be suggestive of osteomyelitis. Electronically Signed   By: Prudencio Pair M.D.   On: 10/06/2020 20:38    Procedures Procedures (including critical care time)  Medications Ordered in ED Medications  vancomycin (VANCOREADY) IVPB 1500 mg/300 mL (1,500 mg Intravenous New Bag/Given 10/06/20 2248)  amitriptyline (ELAVIL) tablet 25 mg (has no  administration in time range)  ferrous sulfate tablet 325 mg (has no administration in time range)  pantoprazole (PROTONIX) EC tablet 40 mg (has no administration in time range)  pregabalin (LYRICA) capsule 50 mg (has no administration in time range)  rosuvastatin (CRESTOR) tablet 20 mg (has no administration in time range)  insulin aspart (novoLOG) injection 0-9 Units (has no administration in time range)  insulin aspart (novoLOG) injection 0-5 Units (0 Units Subcutaneous Not Given 10/06/20 2335)  0.9 %  sodium chloride infusion (has no administration in time range)  ceFEPIme (MAXIPIME) 2 g in sodium chloride 0.9 % 100 mL IVPB (has no administration in time range)  vancomycin (VANCOREADY) IVPB 1250 mg/250 mL (has no administration in time range)  metroNIDAZOLE (FLAGYL) tablet 500 mg (has no administration in time range)  metoprolol tartrate (LOPRESSOR) tablet 12.5 mg (has no administration in time range)  oxyCODONE (Oxy IR/ROXICODONE) immediate release tablet 5 mg (has no administration in time range)  acetaminophen (TYLENOL) tablet 650 mg (650 mg Oral Given 10/06/20 1826)  ceFEPIme (MAXIPIME) 2 g in sodium chloride 0.9 % 100 mL IVPB (0 g Intravenous Stopped 10/06/20 2234)  HYDROcodone-acetaminophen (NORCO) 10-325 MG per tablet 1 tablet (1 tablet Oral Given 10/06/20 2308)    ED Course  I have reviewed the triage vital signs and the nursing notes.  Pertinent labs & imaging results that were available during my care of the patient were reviewed by me and considered in my medical decision making (see chart for details).  Clinical Course as of Oct 07 40  Sat Oct 07, 2020  0039 Creatinine(!): 2.42 [PA]    Clinical Course User Index [PA] Lacinda Axon, MD   MDM Rules/Calculators/A&P                          Patient is a 54 year old with a history of diabetes with neuropathy and left AKA who presented to the ED for evaluation of right foot ulcer. Patient found to be febrile on  arrival given Tylenol.  On exam patient has a wet gangrenous ulcer with erythema and foul smelling drainage between the left helix and second digit. Labs showed normal white count and lactic acid but showed AKI and mild hyponatremia.  X-ray of the right foot showed large areas of overlying ulceration and findings suggestive of osteomyelitis. Patient was started on broad-spectrum antibiotics with cefepime and Vanc, given Norco for pain and IV fluid resuscitation. Orthopedic surgery was consulted for admission. Patient admitted to hospitalist team with plan for surgical procedure in the morning by orthopedic surgery.  Final Clinical Impression(s) / ED Diagnoses Final diagnoses:  Osteomyelitis of right foot, unspecified type [facility])    Rx / DC Orders ED Discharge Orders    None       Lacinda Axon, MD 10/07/20 0050    Drenda Freeze, MD 10/07/20 1901

## 2020-10-07 ENCOUNTER — Inpatient Hospital Stay (HOSPITAL_COMMUNITY): Payer: Medicare Other | Admitting: Anesthesiology

## 2020-10-07 ENCOUNTER — Encounter (HOSPITAL_COMMUNITY): Payer: Self-pay | Admitting: Internal Medicine

## 2020-10-07 ENCOUNTER — Encounter (HOSPITAL_COMMUNITY)
Admission: EM | Disposition: A | Discharge status: Home Health | Payer: Self-pay | Source: Home / Self Care | Attending: Internal Medicine

## 2020-10-07 ENCOUNTER — Other Ambulatory Visit: Payer: Self-pay

## 2020-10-07 DIAGNOSIS — M869 Osteomyelitis, unspecified: Secondary | ICD-10-CM | POA: Diagnosis not present

## 2020-10-07 HISTORY — PX: TRANSMETATARSAL AMPUTATION: SHX6197

## 2020-10-07 LAB — COMPREHENSIVE METABOLIC PANEL
ALT: 8 U/L (ref 0–44)
AST: 10 U/L — ABNORMAL LOW (ref 15–41)
Albumin: 2.2 g/dL — ABNORMAL LOW (ref 3.5–5.0)
Alkaline Phosphatase: 85 U/L (ref 38–126)
Anion gap: 12 (ref 5–15)
BUN: 36 mg/dL — ABNORMAL HIGH (ref 6–20)
CO2: 21 mmol/L — ABNORMAL LOW (ref 22–32)
Calcium: 7.8 mg/dL — ABNORMAL LOW (ref 8.9–10.3)
Chloride: 101 mmol/L (ref 98–111)
Creatinine, Ser: 2.34 mg/dL — ABNORMAL HIGH (ref 0.44–1.00)
GFR, Estimated: 24 mL/min — ABNORMAL LOW (ref >60)
Glucose, Bld: 132 mg/dL — ABNORMAL HIGH (ref 70–99)
Potassium: 3.6 mmol/L (ref 3.5–5.1)
Sodium: 134 mmol/L — ABNORMAL LOW (ref 135–145)
Total Bilirubin: 0.5 mg/dL (ref 0.3–1.2)
Total Protein: 6.2 g/dL — ABNORMAL LOW (ref 6.5–8.1)

## 2020-10-07 LAB — CBC WITH DIFFERENTIAL/PLATELET
Abs Immature Granulocytes: 0.03 10*3/uL (ref 0.00–0.07)
Basophils Absolute: 0 10*3/uL (ref 0.0–0.1)
Basophils Relative: 1 %
Eosinophils Absolute: 0.2 10*3/uL (ref 0.0–0.5)
Eosinophils Relative: 2 %
HCT: 27.2 % — ABNORMAL LOW (ref 36.0–46.0)
Hemoglobin: 7.9 g/dL — ABNORMAL LOW (ref 12.0–15.0)
Immature Granulocytes: 1 %
Lymphocytes Relative: 17 %
Lymphs Abs: 1.1 10*3/uL (ref 0.7–4.0)
MCH: 24.8 pg — ABNORMAL LOW (ref 26.0–34.0)
MCHC: 29 g/dL — ABNORMAL LOW (ref 30.0–36.0)
MCV: 85.5 fL (ref 80.0–100.0)
Monocytes Absolute: 0.5 10*3/uL (ref 0.1–1.0)
Monocytes Relative: 8 %
Neutro Abs: 4.5 10*3/uL (ref 1.7–7.7)
Neutrophils Relative %: 71 %
Platelets: 253 10*3/uL (ref 150–400)
RBC: 3.18 MIL/uL — ABNORMAL LOW (ref 3.87–5.11)
RDW: 15.6 % — ABNORMAL HIGH (ref 11.5–15.5)
WBC: 6.3 10*3/uL (ref 4.0–10.5)
nRBC: 0 % (ref 0.0–0.2)

## 2020-10-07 LAB — SEDIMENTATION RATE: Sed Rate: 87 mm/hr — ABNORMAL HIGH (ref 0–22)

## 2020-10-07 LAB — C-REACTIVE PROTEIN: CRP: 10.1 mg/dL — ABNORMAL HIGH (ref <1.0)

## 2020-10-07 LAB — GLUCOSE, CAPILLARY
Glucose-Capillary: 144 mg/dL — ABNORMAL HIGH (ref 70–99)
Glucose-Capillary: 210 mg/dL — ABNORMAL HIGH (ref 70–99)
Glucose-Capillary: 241 mg/dL — ABNORMAL HIGH (ref 70–99)

## 2020-10-07 LAB — CBG MONITORING, ED
Glucose-Capillary: 104 mg/dL — ABNORMAL HIGH (ref 70–99)
Glucose-Capillary: 123 mg/dL — ABNORMAL HIGH (ref 70–99)

## 2020-10-07 SURGERY — AMPUTATION, FOOT, TRANSMETATARSAL
Anesthesia: Regional | Site: Leg Lower | Laterality: Right

## 2020-10-07 MED ORDER — FENTANYL CITRATE (PF) 250 MCG/5ML IJ SOLN
INTRAMUSCULAR | Status: DC | PRN
  Administered 2020-10-07 (×2): 25 ug via INTRAVENOUS

## 2020-10-07 MED ORDER — OXYCODONE HCL 5 MG PO TABS: 5.0000 mg | ORAL_TABLET | Freq: Once | ORAL | Status: DC | PRN

## 2020-10-07 MED ORDER — CHLORHEXIDINE GLUCONATE 0.12 % MT SOLN: 15.0000 mL | Freq: Once | OROMUCOSAL | Status: AC

## 2020-10-07 MED ORDER — DEXAMETHASONE SODIUM PHOSPHATE 10 MG/ML IJ SOLN
INTRAMUSCULAR | Status: AC
  Filled 2020-10-07: qty 1

## 2020-10-07 MED ORDER — CEFAZOLIN SODIUM-DEXTROSE 2-4 GM/100ML-% IV SOLN: 2.0000 g | INTRAVENOUS | Status: DC

## 2020-10-07 MED ORDER — FENTANYL CITRATE (PF) 250 MCG/5ML IJ SOLN
INTRAMUSCULAR | Status: AC
  Filled 2020-10-07: qty 5

## 2020-10-07 MED ORDER — PROPOFOL 10 MG/ML IV BOLUS
INTRAVENOUS | Status: AC
  Filled 2020-10-07: qty 20

## 2020-10-07 MED ORDER — SUCCINYLCHOLINE CHLORIDE 200 MG/10ML IV SOSY
PREFILLED_SYRINGE | INTRAVENOUS | Status: AC
  Filled 2020-10-07: qty 10

## 2020-10-07 MED ORDER — OXYCODONE HCL 5 MG PO TABS
5.0000 mg | ORAL_TABLET | Freq: Four times a day (QID) | ORAL | Status: DC | PRN
  Administered 2020-10-07 – 2020-10-08 (×2): 10 mg via ORAL
  Administered 2020-10-08: 5 mg via ORAL
  Administered 2020-10-08: 10 mg via ORAL
  Administered 2020-10-09: 5 mg via ORAL
  Administered 2020-10-09 – 2020-10-11 (×6): 10 mg via ORAL
  Filled 2020-10-07 (×3): qty 2
  Filled 2020-10-07 (×2): qty 1
  Filled 2020-10-07 (×5): qty 2
  Filled 2020-10-07: qty 1
  Filled 2020-10-07 (×2): qty 2

## 2020-10-07 MED ORDER — PHENYLEPHRINE 40 MCG/ML (10ML) SYRINGE FOR IV PUSH (FOR BLOOD PRESSURE SUPPORT)
PREFILLED_SYRINGE | INTRAVENOUS | Status: DC | PRN
  Administered 2020-10-07 (×3): 80 ug via INTRAVENOUS

## 2020-10-07 MED ORDER — ONDANSETRON HCL 4 MG/2ML IJ SOLN: 4.0000 mg | Freq: Four times a day (QID) | INTRAMUSCULAR | Status: DC | PRN

## 2020-10-07 MED ORDER — BUPIVACAINE-EPINEPHRINE (PF) 0.5% -1:200000 IJ SOLN
INTRAMUSCULAR | Status: DC | PRN
  Administered 2020-10-07: 25 mL via PERINEURAL
  Administered 2020-10-07: 15 mL via PERINEURAL

## 2020-10-07 MED ORDER — OXYCODONE HCL 5 MG/5ML PO SOLN: 5.0000 mg | Freq: Once | ORAL | Status: DC | PRN

## 2020-10-07 MED ORDER — MIDAZOLAM HCL 2 MG/2ML IJ SOLN
INTRAMUSCULAR | Status: DC | PRN
  Administered 2020-10-07: 1 mg via INTRAVENOUS

## 2020-10-07 MED ORDER — PHENYLEPHRINE HCL-NACL 10-0.9 MG/250ML-% IV SOLN
INTRAVENOUS | Status: DC | PRN
  Administered 2020-10-07: 20 ug/min via INTRAVENOUS

## 2020-10-07 MED ORDER — ACETAMINOPHEN 325 MG PO TABS
650.0000 mg | ORAL_TABLET | Freq: Four times a day (QID) | ORAL | Status: DC | PRN
  Administered 2020-10-08 – 2020-10-09 (×2): 650 mg via ORAL
  Filled 2020-10-07 (×2): qty 2

## 2020-10-07 MED ORDER — CHLORHEXIDINE GLUCONATE 0.12 % MT SOLN
OROMUCOSAL | Status: AC
  Administered 2020-10-07: 15 mL via OROMUCOSAL
  Filled 2020-10-07: qty 15

## 2020-10-07 MED ORDER — ONDANSETRON HCL 4 MG/2ML IJ SOLN
INTRAMUSCULAR | Status: AC
  Filled 2020-10-07: qty 2

## 2020-10-07 MED ORDER — ONDANSETRON HCL 4 MG/2ML IJ SOLN
INTRAMUSCULAR | Status: DC | PRN
  Administered 2020-10-07: 4 mg via INTRAVENOUS

## 2020-10-07 MED ORDER — LIDOCAINE 2% (20 MG/ML) 5 ML SYRINGE
INTRAMUSCULAR | Status: DC | PRN
  Administered 2020-10-07: 40 mg via INTRAVENOUS

## 2020-10-07 MED ORDER — FENTANYL CITRATE (PF) 100 MCG/2ML IJ SOLN
25.0000 ug | INTRAMUSCULAR | Status: DC | PRN
  Administered 2020-10-07: 25 ug via INTRAVENOUS

## 2020-10-07 MED ORDER — FENTANYL CITRATE (PF) 100 MCG/2ML IJ SOLN
INTRAMUSCULAR | Status: AC
  Filled 2020-10-07: qty 2

## 2020-10-07 MED ORDER — DEXAMETHASONE SODIUM PHOSPHATE 10 MG/ML IJ SOLN
INTRAMUSCULAR | Status: DC | PRN
  Administered 2020-10-07: 5 mg via INTRAVENOUS

## 2020-10-07 MED ORDER — MIDAZOLAM HCL 2 MG/2ML IJ SOLN
INTRAMUSCULAR | Status: AC
  Filled 2020-10-07: qty 2

## 2020-10-07 MED ORDER — CEFAZOLIN SODIUM-DEXTROSE 2-3 GM-%(50ML) IV SOLR
INTRAVENOUS | Status: DC | PRN
  Administered 2020-10-07: 2 g via INTRAVENOUS

## 2020-10-07 MED ORDER — ALBUMIN HUMAN 5 % IV SOLN: INTRAVENOUS | Status: DC | PRN

## 2020-10-07 MED ORDER — PROPOFOL 10 MG/ML IV BOLUS
INTRAVENOUS | Status: DC | PRN
  Administered 2020-10-07: 120 mg via INTRAVENOUS

## 2020-10-07 MED ORDER — NICOTINE 21 MG/24HR TD PT24
21.0000 mg | MEDICATED_PATCH | Freq: Every day | TRANSDERMAL | Status: DC
  Administered 2020-10-08 – 2020-10-11 (×4): 21 mg via TRANSDERMAL
  Filled 2020-10-07 (×4): qty 1

## 2020-10-07 MED ORDER — OXYCODONE HCL 5 MG PO TABS: 5.0000 mg | ORAL_TABLET | ORAL | Status: DC | PRN

## 2020-10-07 MED ORDER — 0.9 % SODIUM CHLORIDE (POUR BTL) OPTIME
TOPICAL | Status: DC | PRN
  Administered 2020-10-07: 1000 mL

## 2020-10-07 SURGICAL SUPPLY — 47 items
BLADE SAW RECIP 87.9 MT (BLADE) ×3 IMPLANT
BLADE SAW SGTL HD 18.5X60.5X1. (BLADE) ×3 IMPLANT
BLADE SURG 21 STRL SS (BLADE) ×3 IMPLANT
BNDG CMPR MED 10X6 ELC LF (GAUZE/BANDAGES/DRESSINGS)
BNDG COHESIVE 6X5 TAN STRL LF (GAUZE/BANDAGES/DRESSINGS) ×2 IMPLANT
BNDG ELASTIC 6X10 VLCR STRL LF (GAUZE/BANDAGES/DRESSINGS) ×1 IMPLANT
CANISTER WOUNDNEG PRESSURE 500 (CANNISTER) ×2 IMPLANT
COVER SURGICAL LIGHT HANDLE (MISCELLANEOUS) ×3 IMPLANT
COVER WAND RF STERILE (DRAPES) IMPLANT
CUFF TOURN SGL QUICK 34 (TOURNIQUET CUFF) ×3
CUFF TRNQT CYL 34X4.125X (TOURNIQUET CUFF) ×1 IMPLANT
DRAPE INCISE IOBAN 66X45 STRL (DRAPES) ×5 IMPLANT
DRAPE U-SHAPE 47X51 STRL (DRAPES) ×3 IMPLANT
DRESSING PREVENA PLUS CUSTOM (GAUZE/BANDAGES/DRESSINGS) IMPLANT
DRSG PREVENA PLUS CUSTOM (GAUZE/BANDAGES/DRESSINGS) ×3
DURAPREP 26ML APPLICATOR (WOUND CARE) ×3 IMPLANT
ELECT REM PT RETURN 9FT ADLT (ELECTROSURGICAL) ×3
ELECTRODE REM PT RTRN 9FT ADLT (ELECTROSURGICAL) ×1 IMPLANT
GAUZE SPONGE 4X4 12PLY STRL LF (GAUZE/BANDAGES/DRESSINGS) ×1 IMPLANT
GAUZE XEROFORM 5X9 LF (GAUZE/BANDAGES/DRESSINGS) ×1 IMPLANT
GLOVE BIO SURGEON STRL SZ7.5 (GLOVE) ×7 IMPLANT
GLOVE BIOGEL PI IND STRL 8 (GLOVE) ×1 IMPLANT
GLOVE BIOGEL PI INDICATOR 8 (GLOVE) ×4
GOWN STRL REUS W/ TWL XL LVL3 (GOWN DISPOSABLE) ×2 IMPLANT
GOWN STRL REUS W/TWL XL LVL3 (GOWN DISPOSABLE) ×6
KIT BASIN OR (CUSTOM PROCEDURE TRAY) ×3 IMPLANT
KIT TURNOVER KIT B (KITS) ×3 IMPLANT
MANIFOLD NEPTUNE II (INSTRUMENTS) ×3 IMPLANT
NS IRRIG 1000ML POUR BTL (IV SOLUTION) ×3 IMPLANT
PACK ORTHO EXTREMITY (CUSTOM PROCEDURE TRAY) ×3 IMPLANT
PAD ABD 8X10 STRL (GAUZE/BANDAGES/DRESSINGS) ×2 IMPLANT
PAD ARMBOARD 7.5X6 YLW CONV (MISCELLANEOUS) ×3 IMPLANT
PADDING CAST COTTON 6X4 STRL (CAST SUPPLIES) ×2 IMPLANT
PREVENA RESTOR AXIOFORM 29X28 (GAUZE/BANDAGES/DRESSINGS) ×2 IMPLANT
SPONGE LAP 18X18 RF (DISPOSABLE) ×2 IMPLANT
STAPLER VISISTAT 35W (STAPLE) ×2 IMPLANT
STOCKINETTE IMPERVIOUS LG (DRAPES) ×3 IMPLANT
SUT ETHILON 2 0 FS 18 (SUTURE) ×4 IMPLANT
SUT ETHILON 2 0 PSLX (SUTURE) ×2 IMPLANT
SUT PDS AB 2-0 CT1 27 (SUTURE) ×3 IMPLANT
SUT SILK 2 0 (SUTURE) ×3
SUT SILK 2-0 18XBRD TIE 12 (SUTURE) ×1 IMPLANT
SUT VIC AB 1 CTX 27 (SUTURE) ×6 IMPLANT
TOWEL GREEN STERILE (TOWEL DISPOSABLE) ×3 IMPLANT
TUBE CONNECTING 12'X1/4 (SUCTIONS) ×1
TUBE CONNECTING 12X1/4 (SUCTIONS) ×2 IMPLANT
YANKAUER SUCT BULB TIP NO VENT (SUCTIONS) ×3 IMPLANT

## 2020-10-07 NOTE — Progress Notes (Signed)
Patient admitted to room, alert and oriented x4. No pain or discomfort voiced. Oriented patient to room. Skin dry and warm to touch. Old scar to buttock area. No skin issue noted. Wound VAC with no output noted.

## 2020-10-07 NOTE — ED Notes (Signed)
Admitting MD at bedside.

## 2020-10-07 NOTE — Anesthesia Procedure Notes (Signed)
Procedure Name: LMA Insertion Date/Time: 10/07/2020 11:07 AM Performed by: Dorthea Cove, CRNA Pre-anesthesia Checklist: Patient identified, Emergency Drugs available, Suction available and Patient being monitored Patient Re-evaluated:Patient Re-evaluated prior to induction Oxygen Delivery Method: Circle System Utilized Preoxygenation: Pre-oxygenation with 100% oxygen Induction Type: IV induction Ventilation: Mask ventilation without difficulty LMA: LMA inserted LMA Size: 4.0 Number of attempts: 1 Airway Equipment and Method: Bite block Placement Confirmation: positive ETCO2 Tube secured with: Tape Dental Injury: Teeth and Oropharynx as per pre-operative assessment

## 2020-10-07 NOTE — Op Note (Addendum)
[name] female 54 y.o. 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  The soft tissue compromise extended proximal to the transverse tarsal joints.  IMPLANTS: None  INDICATIONS:54 y.o. female With long-standing uncontrolled diabetes and vasculopathy presented to the emergency department with systemic symptoms consistent with sepsis and evidence of wet gangrene of her right foot medially and dorsally and plantarly.  She had drainage and foul odor.  She had streaking erythema up to the mid calf with significant soft tissue swelling and crepitance.  I had a discussion with the patient and her husband regarding her diagnosis and treatment.  We discussed attempting transmetatarsal amputation versus below the knee amputation however likely that she would need a below the knee amputation.  She had a history of contralateral above-the-knee amputation and history of right leg vascular surgery.   Patient understood the risks, benefits and alternatives to surgery which include but are not limited to wound healing complications, continued infection, need for further surgery as well as damage to surrounding structures. They also understood the potential for continued pain in that there were no guarantees of acceptable outcome. After weighing these risks the patient opted to proceed with surgery.  PROCEDURE: Patient was identified in the preoperative holding area.  The Right leg was marked by myself.  Consent was signed by myself and the patient.  Block was performed by anesthesia in the preoperative  holding area.  Patient was taken to the operative suite and placed supine on the operative table.  General LMA anesthesia was induced without difficulty. Bump was placed under the operative hip and bone foam was used.  All bony prominences were well padded.  Tourniquet was placed on the operative thigh.  Preoperative antibiotics were given. The extremity was prepped and draped in the usual sterile fashion and surgical timeout was performed.  The limb was elevated and the tourniquet was inflated to 250 mmHg.   We began by marking out the skin incision.  This was done approximately 10-12 cm distal to the tibial tubercle.  This was then taken around the leg to allow for a long and tension-free closure for the posterior flap.  Then using a 21 blade the circumferential incision was created down to bone anteriorly and to the muscular fascia medially and laterally and then around the back of the posterior leg.  Then using blunt dissection laterally the common peroneal nerve and artery was identified and ligated in a traction neurectomy type procedure.  Then the artery was tied off with a 2-0 silk.  Then the fibula was identified and transected proximal to the tibial cut position with the sagittal saw.  Then the deep tissue was then continued to be transected.  Then the anterior portion of the tibia was further identified in the appropriate position the cut was identified using a ruler.  This was 10 cm distal to the tibial tubercle.  Then sagittal saw was used to cut the tibia.  Then a bone hook was placed within the tibia and the amputation knife was used to further amputate the leg.   Then the tibial nerve was transected in a traction neurectomy fashion.  The neurovascular bundle  was tied off with a 2-0 silk tie.  Soft tissue flap was further debulked.  The musculature that remained was slightly dusky but without evidence of gross infection.  After soft tissue flap was further inspected and found to be without gross  evidence of infection.  Then the tourniquet was released.  Hemostasis was obtained using Bovie cautery and 2-0 silk ties for active bleeding vessels.  Once adequate hemostasis was obtained the skin flap was closed over the remaining below the knee amputation stump.  This was done using the fascia for the gastrocnemius muscle sutured with a 2-0 PDS to the periosteal tissue and anterior compartment fascia.  Then the skin was closed in a layered fashion using 2-0 Monocryl suture and 2-0 nylon suture.  This was done with a tension-free closure.  Skin edges were well approximated.    After the wound was closed Wound VAC was placed about the incision and about the distal stump.  Good suction was obtained.  Wound VAC was attached to the wound VAC machine.    Patient was then awakened from anesthesia and taken recovery in stable condition.  There were no complications.  Count was correct at the end of the case.   POST OPERATIVE INSTRUCTIONS: Keep dressing in place Recommend continuing antibiotics due to continued cellulitic changes of the soft tissues after the amputation was completed. Work with physical therapy for mobilization  We will remove the wound VAC in 3 days. Once discharged from the hospital patient will follow up for wound check in approximately 2 weeks Once skin is adequately healed he will be sent to the prosthetists for evaluation.   BLOOD LOSS:  less than 50 mL         DRAINS: none         SPECIMEN: none       COMPLICATIONS:  * No complications entered in OR log *         Disposition: PACU - hemodynamically stable.         Condition: stable

## 2020-10-07 NOTE — Consult Note (Signed)
Reason for Consult: Right foot osteomyelitis with wet gangrene and cellulitis streaking up the leg and foot with diffuse soft tissue infection Referring Physician: [facility]  [name] is an 54 y.o. female.  HPI: Was brought to the emergency department due to foot wound.  She had a chronic wound on the medial aspect of her foot in the setting of uncontrolled diabetes and vasculopathy.  Patient states her husband has not been taking care of her wound well.  She states "keep him away from my leg".  She has been dealing with this wound for a long time.  She is not sure when it started.  She has history of left above-the-knee amputation.  Denies pain currently.  Does not know her A1c.  Past Medical History:  Diagnosis Date  . Abnormal stress test    a. 02/2017 MV: large region of fixed perfusion defect in basal to mid inf, mid-dist inflat walls, EF 43%. No ischemia (EF 55-60% by f/u echo).  . Arthritis   . Asthma   . Carotid arterial disease ([facility] [place])    a. 09/2017 Carotid U/S: 40-49% bilat ICA stenosis.  . Chronic back pain   . Coronary artery calcification seen on CT scan    a. 11/2017 CT Abd/Pelvis: Multi vessel coronary vascular Ca2+.  . Depression   . Diabetes mellitus   . Diabetic neuropathy ([place])   . Difficult intubation    DIFFICULT AIRWAY/FYI  . Family history of adverse reaction to anesthesia    mother had difficlty waking   . Femoral-popliteal bypass graft occlusion, left ([place]) 12/02/2017  . GERD (gastroesophageal reflux disease)   . History of echocardiogram    a. 03/2017 Echo: EF 55-60%, mild LVH, nl RV fxn.  . Hyperlipidemia   . Ischemic cardiomyopathy    a. 04/2018 Echo: EF 30-35%, Gr2 DD, mild LVH. Mild MR. Mildly dil LA, mod dil RV w/ mod red RV fxn, mild TR, PASP 54mmHg.  . NSTEMI (non-ST elevated myocardial infarction) ([place])    a. 05/2018 in setting of Afib, sepsis, and post-op L AKA. Peak trop 7.7. EF 30-35% by echo-->cath not performed 2/2 renal  failure.  . Osteomyelitis of right fibula ([place]) 03/05/2017  . PAD (peripheral artery disease) ([place])    a. S/p L fem-pop bypass; b. 11/2017 s/p Aortobifem bypass 2/2 graft occlusion; c. 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  . Paroxysmal atrial fibrillation with rapid ventricular response ([place]) 12/02/2017   a. CHA2DS2VASc = 3-->Xarelto; b. 05/2018 Recurrent Afib-->amio.  Marland Kitchen Ulcer    Foot    Past Surgical History:  Procedure Laterality Date  . ABDOMINAL AORTAGRAM  June 15, 2014  . ABDOMINAL AORTAGRAM N/A 06/15/2014   Procedure: ABDOMINAL Maxcine Ham;  Surgeon: Serafina Mitchell, MD;  Location: [facility] CATH LAB;  Service: Cardiovascular;  Laterality: N/A;  . ABDOMINAL AORTAGRAM N/A 11/22/2014   Procedure: ABDOMINAL AORTAGRAM;  Surgeon: Serafina Mitchell, MD;  Location: [facility] CATH LAB;  Service: Cardiovascular;  Laterality: N/A;  . ABDOMINAL AORTOGRAM W/LOWER EXTREMITY N/A 01/07/2017   Procedure: Abdominal Aortogram w/Lower Extremity;  Surgeon: Serafina Mitchell, MD;  Location: [place] CV LAB;  Service: Cardiovascular;  Laterality: N/A;  . ABDOMINAL AORTOGRAM W/LOWER EXTREMITY N/A 10/31/2017   Procedure: ABDOMINAL AORTOGRAM W/LOWER EXTREMITY;  Surgeon: Elam Dutch, MD;  Location: [place] CV LAB;  Service: Cardiovascular;  Laterality: N/A;  . ABDOMINAL AORTOGRAM W/LOWER EXTREMITY N/A 03/24/2018   Procedure: ABDOMINAL AORTOGRAM W/LOWER EXTREMITY;  Surgeon:  Serafina Mitchell, MD;  Location: [place] CV LAB;  Service: Cardiovascular;  Laterality: N/A;  . AMPUTATION Left 04/26/2018   Procedure: AMPUTATION ABOVE KNEE;  Surgeon: Elam Dutch, MD;  Location: [place];  Service: Vascular;  Laterality: Left;  . AORTA - BILATERAL FEMORAL ARTERY BYPASS GRAFT N/A 11/28/2017   Procedure: AORTA BIFEMORAL BYPASS USING HEMASHIELD GOLD GRAFT & REIMPLANT IMA;  Surgeon: Serafina Mitchell, MD;  Location: [facility] OR;  Service: Vascular;  Laterality: N/A;  . AORTIC ARCH ANGIOGRAPHY N/A 10/31/2017    Procedure: AORTIC ARCH ANGIOGRAPHY;  Surgeon: Elam Dutch, MD;  Location: [place] CV LAB;  Service: Cardiovascular;  Laterality: N/A;  . APPLICATION OF WOUND VAC  11/28/2017   Procedure: APPLICATION OF WOUND VAC;  Surgeon: Serafina Mitchell, MD;  Location: [facility] OR;  Service: Vascular;;  . APPLICATION OF WOUND VAC Left 03/27/2018   Procedure: APPLICATION OF WOUND VAC LEFT GROIN USING PREVENA PLUS;  Surgeon: Serafina Mitchell, MD;  Location: [place];  Service: Vascular;  Laterality: Left;  . ARTERIAL BYPASS SURGERY   07/05/2010   Right Common Femoral to below knee popliteal BPG  . BACK SURGERY     X's  2  . CARDIAC CATHETERIZATION    . CHOLECYSTECTOMY     Gall Bladder  . CYSTECTOMY Left    wrist  . EMBOLECTOMY Left 11/28/2017   Procedure: Left Lower Extremity Embolectomy, Left Tibial Peroneal Trunk Endarterectomy with Patch Angioplasty; Vein Harvest Small Saphenous Graft Left Lower Leg;  Surgeon: [name], MD;  Location: [place];  Service: Vascular;  Laterality: Left;  . EYE SURGERY Left 02/23/2020   Dr. Zadie Rhine  . FEMORAL-POPLITEAL BYPASS GRAFT Left 03/27/2018   Procedure: THROMBECTOMY OF LEFT FEMORAL TIBIAL BYPASS;  Surgeon: Serafina Mitchell, MD;  Location: [place];  Service: Vascular;  Laterality: Left;  . FEMORAL-TIBIAL BYPASS GRAFT Left 03/27/2018   Procedure: BYPASS GRAFT FEMORAL-TIBIAL ARTERY LEFT REDO USING CRYOPRESERVED SAPHENOUS VEIN 70cm;  Surgeon: Serafina Mitchell, MD;  Location: [facility] OR;  Service: Vascular;  Laterality: Left;  . INTERCOSTAL NERVE BLOCK  November 2015  . INTRAOPERATIVE ARTERIOGRAM  11/28/2017   Procedure: INTRA OPERATIVE ARTERIOGRAM OF LEFT LEG;  Surgeon: Serafina Mitchell, MD;  Location: [place];  Service: Vascular;;  . INTRAOPERATIVE ARTERIOGRAM Left 03/27/2018   Procedure: INTRA OPERATIVE ARTERIOGRAM TIMES TWO;  Surgeon: Serafina Mitchell, MD;  Location: [place];  Service: Vascular;  Laterality: Left;  . IR FLUORO GUIDE CV LINE RIGHT  03/20/2017  . IR FLUORO  GUIDE CV LINE RIGHT  05/05/2018  . IR REMOVAL TUN CV CATH W/O FL  05/20/2018  . IR US GUIDE VASC ACCESS RIGHT  03/20/2017  . IR US GUIDE VASC ACCESS RIGHT  05/05/2018  . IRRIGATION AND DEBRIDEMENT BUTTOCKS Right 09/30/2016   Procedure: DEBRIDEMENT RIGHT  BUTTOCK WOUND;  Surgeon: Georganna Skeans, MD;  Location: [place];  Service: General;  Laterality: Right;  . left foot surgery    . PERIPHERAL VASCULAR CATHETERIZATION N/A 05/07/2016   Procedure: Abdominal Aortogram;  Surgeon: Serafina Mitchell, MD;  Location: [place] CV LAB;  Service: Cardiovascular;  Laterality: N/A;  . PERIPHERAL VASCULAR CATHETERIZATION N/A 05/07/2016   Procedure: Lower Extremity Angiography;  Surgeon: Serafina Mitchell, MD;  Location: [place] CV LAB;  Service: Cardiovascular;  Laterality: N/A;  . PERIPHERAL VASCULAR CATHETERIZATION N/A 05/07/2016   Procedure: Aortic Arch Angiography;  Surgeon: Serafina Mitchell, MD;  Location: [place] CV LAB;  Service: Cardiovascular;  Laterality: N/A;  . PERIPHERAL VASCULAR CATHETERIZATION N/A 05/07/2016   Procedure: Upper Extremity Angiography;  Surgeon: Serafina Mitchell, MD;  Location: [place] CV LAB;  Service: Cardiovascular;  Laterality: N/A;  . PERIPHERAL VASCULAR CATHETERIZATION Right 05/07/2016   Procedure: Peripheral Vascular Balloon Angioplasty;  Surgeon: Serafina Mitchell, MD;  Location: [place] CV LAB;  Service: Cardiovascular;  Laterality: Right;  subclavian  . PERIPHERAL VASCULAR CATHETERIZATION Right 05/07/2016   Procedure: Peripheral Vascular Intervention;  Surgeon: Serafina Mitchell, MD;  Location: [place] CV LAB;  Service: Cardiovascular;  Laterality: Right;  External  Iliac  . SKIN GRAFT Right 2012   [name] by Dr. Nils Pyle- Right and Left Ankle  . SPINE SURGERY    . THROMBECTOMY FEMORAL ARTERY  11/28/2017   Procedure: THROMBECTOMY  & REVISION OF BILATERAL FEMORAL TO POPLETEAL ARTERIES;  Surgeon: Serafina Mitchell, MD;  Location: [facility] OR;  Service: Vascular;;  . TONSILLECTOMY       Family History  Problem Relation Age of Onset  . Coronary artery disease Mother   . Peripheral vascular disease Mother   . Heart disease Mother        Before age 86  . Other Mother        Venous insuffiency  . Diabetes Mother   . Hyperlipidemia Mother   . Hypertension Mother   . Varicose Veins Mother   . Heart attack Mother        before age 54  . Heart disease Father   . Diabetes Father   . Diabetes Maternal Grandmother   . Diabetes Paternal Grandmother   . Diabetes Paternal Grandfather   . Diabetes Sister   . Hypertension Sister   . Diabetes Brother   . Hypertension Brother     Social History:  reports that she has been smoking cigarettes. She has a 30.00 pack-year smoking history. She has never used smokeless tobacco. She reports that she does not drink alcohol and does not use drugs.  Allergies:  Allergies  Allergen Reactions  . Lactose Intolerance (Gi) Diarrhea    Medications: I have reviewed the patient's current medications.  Results for orders placed or performed during the hospital encounter of 10/06/20 (from the past 48 hour(s))  Comprehensive metabolic panel     Status: Abnormal   Collection Time: 10/06/20  1:43 PM  Result Value Ref Range   Sodium 132 (L) 135 - 145 mmol/L   Potassium 3.7 3.5 - 5.1 mmol/L   Chloride 101 98 - 111 mmol/L   CO2 19 (L) 22 - 32 mmol/L   Glucose, Bld 169 (H) 70 - 99 mg/dL    Comment: Glucose reference range applies only to samples taken after fasting for at least 8 hours.   BUN 37 (H) 6 - 20 mg/dL   Creatinine, Ser 2.42 (H) 0.44 - 1.00 mg/dL   Calcium 8.0 (L) 8.9 - 10.3 mg/dL   Total Protein 6.7 6.5 - 8.1 g/dL   Albumin 2.4 (L) 3.5 - 5.0 g/dL   AST 9 (L) 15 - 41 U/L   ALT 8 0 - 44 U/L   Alkaline Phosphatase 95 38 - 126 U/L   Total Bilirubin 0.5 0.3 - 1.2 mg/dL   GFR, Estimated 23 (L) >60 mL/min    Comment: (NOTE) Calculated using the CKD-EPI Creatinine Equation (2021)    Anion gap 12 5 - 15    Comment: Performed at  [facility], [place] [address] [place], [place] [address]  CBC with Differential  Status: Abnormal   Collection Time: 10/06/20  1:43 PM  Result Value Ref Range   WBC 8.4 4.0 - 10.5 K/uL   RBC 3.42 (L) 3.87 - 5.11 MIL/uL   Hemoglobin 8.5 (L) 12.0 - 15.0 g/dL   HCT 29.1 (L) 36 - 46 %   MCV 85.1 80.0 - 100.0 fL   MCH 24.9 (L) 26.0 - 34.0 pg   MCHC 29.2 (L) 30.0 - 36.0 g/dL   RDW 15.8 (H) 11.5 - 15.5 %   Platelets 285 150 - 400 K/uL   nRBC 0.0 0.0 - 0.2 %   Neutrophils Relative % 83 %   Neutro Abs 7.1 1.7 - 7.7 K/uL   Lymphocytes Relative 10 %   Lymphs Abs 0.8 0.7 - 4.0 K/uL   Monocytes Relative 4 %   Monocytes Absolute 0.4 0.1 - 1.0 K/uL   Eosinophils Relative 1 %   Eosinophils Absolute 0.1 0.0 - 0.5 K/uL   Basophils Relative 1 %   Basophils Absolute 0.1 0.0 - 0.1 K/uL   Immature Granulocytes 1 %   Abs Immature Granulocytes 0.05 0.00 - 0.07 K/uL    Comment: Performed at [facility], [address], [place], [place] [address]  Respiratory Panel by RT PCR (Flu A&B, Covid) - Nasopharyngeal Swab     Status: None   Collection Time: 10/06/20  8:50 PM   Specimen: Nasopharyngeal Swab; Nasopharyngeal(NP) swabs in vial transport medium  Result Value Ref Range   SARS Coronavirus 2 by RT PCR NEGATIVE NEGATIVE    Comment: (NOTE) SARS-CoV-2 target nucleic acids are NOT DETECTED.  The SARS-CoV-2 RNA is generally detectable in upper respiratoy specimens during the acute phase of infection. The lowest concentration of SARS-CoV-2 viral copies this assay can detect is 131 copies/mL. A negative result does not preclude SARS-Cov-2 infection and should not be used as the sole basis for treatment or other patient management decisions. A negative result may occur with  improper specimen collection/handling, submission of specimen other than nasopharyngeal swab, presence of viral mutation(s) within the areas targeted by this assay, and inadequate number of viral copies (<131  copies/mL). A negative result must be combined with clinical observations, patient history, and epidemiological information. The expected result is Negative.  Fact Sheet for Patients:  PinkCheek.be  Fact Sheet for Healthcare Providers:  GravelBags.it  This test is no t yet approved or cleared by the [place] FDA and  has been authorized for detection and/or diagnosis of SARS-CoV-2 by FDA under an Emergency Use Authorization (EUA). This EUA will remain  in effect (meaning this test can be used) for the duration of the COVID-19 declaration under Section 564(b)(1) of the Act, 21 U.S.C. section 360bbb-3(b)(1), unless the authorization is terminated or revoked sooner.     Influenza A by PCR NEGATIVE NEGATIVE   Influenza B by PCR NEGATIVE NEGATIVE    Comment: (NOTE) The Xpert Xpress SARS-CoV-2/FLU/RSV assay is intended as an aid in  the diagnosis of influenza from Nasopharyngeal swab specimens and  should not be used as a sole basis for treatment. Nasal washings and  aspirates are unacceptable for Xpert Xpress SARS-CoV-2/FLU/RSV  testing.  Fact Sheet for Patients: PinkCheek.be  Fact Sheet for Healthcare Providers: GravelBags.it  This test is not yet approved or cleared by the [place] FDA and  has been authorized for detection and/or diagnosis of SARS-CoV-2 by  FDA under an Emergency Use Authorization (EUA). This EUA will remain  in effect (meaning this test can be used)  for the duration of the  Covid-19 declaration under Section 564(b)(1) of the Act, 21  U.S.C. section 360bbb-3(b)(1), unless the authorization is  terminated or revoked. Performed at [facility], [place] [address], [place], [place] [address]   Lactic acid, plasma     Status: None   Collection Time: 10/06/20  8:56 PM  Result Value Ref Range   Lactic Acid, Venous 0.9 0.5 - 1.9 mmol/L     Comment: Performed at [place] [address] [place], [place] [address]  Type and screen [place]     Status: None   Collection Time: 10/06/20 10:51 PM  Result Value Ref Range   ABO/RH(D) O NEG    Antibody Screen NEG    Sample Expiration      10/09/2020,2359 Performed at [facility], [place] [address] [place], [place] [address]   CBG monitoring, ED     Status: Abnormal   Collection Time: 10/06/20 11:12 PM  Result Value Ref Range   Glucose-Capillary 104 (H) 70 - 99 mg/dL    Comment: Glucose reference range applies only to samples taken after fasting for at least 8 hours.   Comment 1 Notify RN    Comment 2 Document in Chart   CBC with Differential/Platelet     Status: Abnormal   Collection Time: 10/07/20  1:53 AM  Result Value Ref Range   WBC 6.3 4.0 - 10.5 K/uL   RBC 3.18 (L) 3.87 - 5.11 MIL/uL   Hemoglobin 7.9 (L) 12.0 - 15.0 g/dL   HCT 27.2 (L) 36 - 46 %   MCV 85.5 80.0 - 100.0 fL   MCH 24.8 (L) 26.0 - 34.0 pg   MCHC 29.0 (L) 30.0 - 36.0 g/dL   RDW 15.6 (H) 11.5 - 15.5 %   Platelets 253 150 - 400 K/uL   nRBC 0.0 0.0 - 0.2 %   Neutrophils Relative % 71 %   Neutro Abs 4.5 1.7 - 7.7 K/uL   Lymphocytes Relative 17 %   Lymphs Abs 1.1 0.7 - 4.0 K/uL   Monocytes Relative 8 %   Monocytes Absolute 0.5 0.1 - 1.0 K/uL   Eosinophils Relative 2 %   Eosinophils Absolute 0.2 0.0 - 0.5 K/uL   Basophils Relative 1 %   Basophils Absolute 0.0 0.0 - 0.1 K/uL   Immature Granulocytes 1 %   Abs Immature Granulocytes 0.03 0.00 - 0.07 K/uL    Comment: Performed at [facility], [address] [place], [place] [address]  Comprehensive metabolic panel     Status: Abnormal   Collection Time: 10/07/20  1:53 AM  Result Value Ref Range   Sodium 134 (L) 135 - 145 mmol/L   Potassium 3.6 3.5 - 5.1 mmol/L   Chloride 101 98 - 111 mmol/L   CO2 21 (L) 22 - 32 mmol/L   Glucose, Bld 132 (H) 70 - 99 mg/dL    Comment: Glucose reference range applies only to  samples taken after fasting for at least 8 hours.   BUN 36 (H) 6 - 20 mg/dL   Creatinine, Ser 2.34 (H) 0.44 - 1.00 mg/dL   Calcium 7.8 (L) 8.9 - 10.3 mg/dL   Total Protein 6.2 (L) 6.5 - 8.1 g/dL   Albumin 2.2 (L) 3.5 - 5.0 g/dL   AST 10 (L) 15 - 41 U/L   ALT 8 0 - 44 U/L   Alkaline Phosphatase 85 38 - 126 U/L   Total  Bilirubin 0.5 0.3 - 1.2 mg/dL   GFR, Estimated 24 (L) >60 mL/min    Comment: (NOTE) Calculated using the CKD-EPI Creatinine Equation (2021)    Anion gap 12 5 - 15    Comment: Performed at [facility] [address], [place], [place] [address]  Sedimentation rate     Status: Abnormal   Collection Time: 10/07/20  1:53 AM  Result Value Ref Range   Sed Rate 87 (H) 0 - 22 mm/hr    Comment: Performed at [facility] [address], [place], [place] [address]  C-reactive protein     Status: Abnormal   Collection Time: 10/07/20  1:53 AM  Result Value Ref Range   CRP 10.1 (H) <1.0 mg/dL    Comment: Performed at [facility] [address], [place], [place] [address]  CBG monitoring, ED     Status: Abnormal   Collection Time: 10/07/20  7:02 AM  Result Value Ref Range   Glucose-Capillary 104 (H) 70 - 99 mg/dL    Comment: Glucose reference range applies only to samples taken after fasting for at least 8 hours.  CBG monitoring, ED     Status: Abnormal   Collection Time: 10/07/20  8:58 AM  Result Value Ref Range   Glucose-Capillary 123 (H) 70 - 99 mg/dL    Comment: Glucose reference range applies only to samples taken after fasting for at least 8 hours.    DG Foot Complete Right  Result Date: 10/06/2020 CLINICAL DATA:  Right foot fell over and pain EXAM: RIGHT FOOT COMPLETE - 3+ VIEW COMPARISON:  None. FINDINGS: There is an obliquely oriented mildly displaced fracture seen through the mid first metatarsal. Large area of ulceration overlying the first metatarsal with subcutaneous emphysema and soft tissue swelling. There is area of mottled cortical  irregularity seen throughout the first metatarsal head and phalanges. Significant soft tissue swelling seen diffusely around the the dorsum of the foot. IMPRESSION: Mildly displaced fracture seen through the first metatarsal. large area of overlying ulceration and findings that could be suggestive of osteomyelitis. Electronically Signed   By: Prudencio Pair M.D.   On: 10/06/2020 20:38    Review of Systems  Constitutional: Positive for chills and fever.  HENT: Negative.   Respiratory: Negative.   Cardiovascular: Negative.   Gastrointestinal: Negative.   Musculoskeletal:       Right foot infection  Skin:       Redness to leg  Neurological: Positive for numbness.   Blood pressure (!) 106/49, pulse 88, temperature 98.8 F (37.1 C), temperature source Oral, resp. rate 13, height 5\' 7"  (1.702 m), weight 72.6 kg, SpO2 97 %. Physical Exam Constitutional:      Appearance: She is obese. She is ill-appearing.  HENT:     Head: Normocephalic.     Mouth/Throat:     Mouth: Mucous membranes are moist.  Eyes:     Extraocular Movements: Extraocular movements intact.  Cardiovascular:     Rate and Rhythm: Normal rate.  Pulmonary:     Effort: Pulmonary effort is normal.  Abdominal:     Palpations: Abdomen is soft.  Musculoskeletal:     Cervical back: Neck supple.     Comments: Right foot with swelling and wet gangrene of medial foot that extends up to the transverse tarsal joints.  She has erythema and swelling of the foot dorsally and laterally as well with erythema and pitting edema up the leg.  No tenderness to palpation  about the knee.  She has evidence of left-sided above-the-knee amputation.  Skin:    Comments: Swelling and erythema to the right leg.  Skin loss with wet gangrene to the medial foot extending up to the transverse tarsal joints area.  Neurological:     Mental Status: She is alert.     Sensory: Sensory deficit present.     Assessment/Plan: We will plan for transmetatarsal  versus below the knee amputation.  She has significant amount of skin loss to the medial aspect of her foot that extends up to the transverse tarsal joints with exposed first metatarsal and wet gangrene.  I am not sure we will be be able to cover with acceptable tissue for transmetatarsal amputation therefore likely will need a below the knee amputation.  Did discuss this with her and her husband today.  She understands and is in agreement to proceed with surgery.  She does understand that her infection is severe and that antibiotics alone will not cure the infection given that it is in the bone and there is skin loss with wet gangrene.  She is amenable to proceed with surgery.  She understands the risks, benefits and alternatives of surgery which include but not limited to wound healing complications, continued infection, need for further surgery including a higher level amputation.  She also understands the risk of the infection for endorgan damage and loss of life.  She understands the anesthetic risks which were discussed with the anesthesiologist.  We will proceed with surgery.  Erle Crocker 10/07/2020, 10:25 AM

## 2020-10-07 NOTE — ED Notes (Signed)
Consent signed and at bedside  

## 2020-10-07 NOTE — Anesthesia Procedure Notes (Signed)
Anesthesia Regional Block: Adductor canal block   Pre-Anesthetic Checklist: ,, timeout performed, Correct Patient, Correct Site, Correct Laterality, Correct Procedure, Correct Position, site marked, Risks and benefits discussed,  Surgical consent,  Pre-op evaluation,  At surgeon's request and post-op pain management  Laterality: Right  Prep: chloraprep       Needles:  Injection technique: Single-shot  Needle Type: Echogenic Needle     Needle Length: 9cm  Needle Gauge: 21     Additional Needles:   Narrative:  Start time: 10/07/2020 10:15 AM End time: 10/07/2020 10:22 AM Injection made incrementally with aspirations every 5 mL.  Performed by: Personally  Anesthesiologist: Albertha Ghee, MD  Additional Notes: Pt tolerated the procedure well.

## 2020-10-07 NOTE — Anesthesia Procedure Notes (Signed)
Anesthesia Regional Block: Popliteal block   Pre-Anesthetic Checklist: ,, timeout performed, Correct Patient, Correct Site, Correct Laterality, Correct Procedure, Correct Position, site marked, Risks and benefits discussed,  Surgical consent,  Pre-op evaluation,  At surgeon's request and post-op pain management  Laterality: Right  Prep: chloraprep       Needles:  Injection technique: Single-shot  Needle Type: Echogenic Stimulator Needle          Additional Needles:   Procedures:, nerve stimulator,,,,,,,   Nerve Stimulator or Paresthesia:  Response: plantar flexion of foot, 0.45 mA,   Additional Responses:   Narrative:  Start time: 10/07/2020 10:05 AM End time: 10/07/2020 10:15 AM Injection made incrementally with aspirations every 5 mL.  Performed by: Personally  Anesthesiologist: Albertha Ghee, MD  Additional Notes: Functioning IV was confirmed and monitors were applied.  A 1mm 21ga Arrow echogenic stimulator needle was used. Sterile prep and drape,hand hygiene and sterile gloves were used.  Negative aspiration and negative test dose prior to incremental administration of local anesthetic. The patient tolerated the procedure well.  Ultrasound guidance: relevent anatomy identified, needle position confirmed, local anesthetic spread visualized around nerve(s), vascular puncture avoided.  Image printed for medical record.

## 2020-10-07 NOTE — Transfer of Care (Signed)
Immediate Anesthesia Transfer of Care Note  Patient: YAMEL BALE  Procedure(s) Performed: RIGHT BELOW KNEE AMPUTATION (Right Leg Lower)  Patient Location: PACU  Anesthesia Type:General  Level of Consciousness: awake, alert  and oriented  Airway & Oxygen Therapy: Patient Spontanous Breathing and Patient connected to nasal cannula oxygen  Post-op Assessment: Report given to RN and Post -op Vital signs reviewed and stable  Post vital signs: Reviewed and stable  Last Vitals:  Vitals Value Taken Time  BP    Temp    Pulse 93 10/07/20 1217  Resp 14 10/07/20 1217  SpO2 94 % 10/07/20 1217  Vitals shown include unvalidated device data.  Last Pain:  Vitals:   10/07/20 0859  TempSrc:   PainSc: 9          Complications: No complications documented.

## 2020-10-07 NOTE — ED Notes (Signed)
Warm blankets given to pt per request. Pending OR per Joanell Rising.

## 2020-10-07 NOTE — Anesthesia Preprocedure Evaluation (Signed)
Anesthesia Evaluation  Patient identified by MRN, date of birth, ID band Patient awake    Reviewed: Allergy & Precautions, H&P , NPO status , Patient's Chart, lab work & pertinent test results  History of Anesthesia Complications (+) DIFFICULT AIRWAY and history of anesthetic complications  Airway Mallampati: II   Neck ROM: full    Dental   Pulmonary asthma , Current Smoker,    breath sounds clear to auscultation       Cardiovascular hypertension, + CAD, + Past MI, + Peripheral Vascular Disease and +CHF  + dysrhythmias Atrial Fibrillation  Rhythm:regular Rate:Normal  TTE (04/2018): EF 30-35%   Neuro/Psych PSYCHIATRIC DISORDERS Depression  Neuromuscular disease    GI/Hepatic GERD  ,  Endo/Other  diabetes, Type 2  Renal/GU Renal InsufficiencyRenal disease     Musculoskeletal  (+) Arthritis ,   Abdominal   Peds  Hematology  (+) Blood dyscrasia, anemia ,   Anesthesia Other Findings   Reproductive/Obstetrics                             Anesthesia Physical Anesthesia Plan  ASA: IV  Anesthesia Plan: MAC and Regional   Post-op Pain Management:    Induction: Intravenous  PONV Risk Score and Plan: 1 and Propofol infusion, Midazolam and Treatment may vary due to age or medical condition  Airway Management Planned: Simple Face Mask  Additional Equipment:   Intra-op Plan:   Post-operative Plan:   Informed Consent: I have reviewed the patients History and Physical, chart, labs and discussed the procedure including the risks, benefits and alternatives for the proposed anesthesia with the patient or authorized representative who has indicated his/her understanding and acceptance.       Plan Discussed with: CRNA, Anesthesiologist and Surgeon  Anesthesia Plan Comments:         Anesthesia Quick Evaluation

## 2020-10-07 NOTE — Progress Notes (Addendum)
PROGRESS NOTE   [name]  AYT:[id number]    DOB: 1966-04-20    DOA: 10/06/2020  PCP: Frazier Richards, MD   I have briefly reviewed patients previous medical records in [place].  Chief Complaint  Patient presents with  . Foot Pain    Brief Narrative:  54 year old married female, lives with her spouse, moves around with the help of a wheelchair, PMH of type II DM with peripheral neuropathy, GERD, hyperlipidemia, paroxysmal atrial fibrillation on Xarelto, PAD, s/p left AKA, ischemic cardiomyopathy, ongoing tobacco use disorder, bilateral carotid artery stenosis, presented to ED due to worsening chronic right foot ulcer with associated foul smell.  Admitted for right first toe/right medial foot wet gangrene, osteomyelitis and cellulitis complicating chronic wound, DM with peripheral neuropathy and PAD.  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   Assessment & Plan:  Active Problems:   Foot osteomyelitis, right (HCC)   Wet gangrene, osteomyelitis and cellulitis complicating chronic wound of right great toe/medial right forefoot in the background of diabetic peripheral neuropathy and PAD: Empirically started on IV cefepime, vancomycin and oral Flagyl.  Patient states that she has not taken most of her meds except her pain meds for several days.  Orthopedics/Dr. Lucia Gaskins consulted and patient underwent left BKA on 11/20.  Postop management per Dr. Lucia Gaskins.  Blood cultures negative to date.  Acute kidney injury complicating stage IIIb CKD Most recent creatinine 1.6 on 01/22/2019.  This may have progressed since.  Presented with creatinine of 2.4, down to 2.34.  Not clear if she has AKI or her CKD has progressed.  Follow daily BMP.  Minimize nephrotoxic medications.  Continue gentle IV fluids.  Normocytic anemia, suspect anemia of chronic disease No recent labs to compare.  Presented with hemoglobin of 8.5 which dropped to 7.9.  Follow CBC daily and transfuse if hemoglobin 7 g or  less.  Continue iron supplements.  Type II DM with peripheral neuropathy and renal complications: No recent A1c, last A1c was 8.4 in May 2019.  Follow repeat A1c.  Continue SSI.  Reasonable CBG control as noted below.  Patient does not appear to be on oral meds at home.  However she is on Lantus.  For now continue SSI only but low threshold to restart at least a low-dose Lantus based on increasing CBGs.  Essential hypertension: Soft blood pressures.  Was not on any antihypertensives PTA, was not taking beta-blockers.  Hyperlipidemia: Had not been taking statins PTA.  Continue statins.  Tobacco use disorder Cessation counseled.  Nicotine patch per patient's agreement.  Paroxysmal atrial fibrillation: Patient reports noncompliance with medications other than her pain medicines.  Unable to say when last she took Xarelto.  Telemetry shows sinus rhythm.  Monitor on telemetry.  Consider restarting Xarelto when able after orthopedic clearance.  PAD Appears to have multiple interventions in the past.  S/p left BKA.  Continue home statins.  Body mass index is 25.06 kg/m.   DVT prophylaxis:   To be determined on 11/21.  Unable to use SCDs because left BKA and today underwent right BKA with wound VAC placement.   Code Status: Full Code Family Communication: None at bedside Disposition:  Status is: Inpatient  Remains inpatient appropriate because:Inpatient level of care appropriate due to severity of illness   Dispo:  Patient From: Home  Planned Disposition: Home with Health Care Svc  Expected discharge date: 10/09/20  Medically stable for discharge: No         Consultants:  Orthopedics  Procedures:   Right BKA with wound VAC placement on 11/20.  Antimicrobials:    Anti-infectives (From admission, onward)   Start     Dose/Rate Route Frequency Ordered Stop   10/08/20 2300  [MAR Hold]  vancomycin (VANCOREADY) IVPB 1250 mg/250 mL        (MAR Hold since Sat 10/07/2020 at  0947.Hold Reason: Transfer to a Procedural area.)   1,250 mg 166.7 mL/hr over 90 Minutes Intravenous Every 48 hours 10/06/20 2221     10/07/20 2130  [MAR Hold]  ceFEPIme (MAXIPIME) 2 g in sodium chloride 0.9 % 100 mL IVPB        (MAR Hold since Sat 10/07/2020 at 0947.Hold Reason: Transfer to a Procedural area.)   2 g 200 mL/hr over 30 Minutes Intravenous Every 24 hours 10/06/20 2221     10/06/20 2230  [MAR Hold]  metroNIDAZOLE (FLAGYL) tablet 500 mg        (MAR Hold since Sat 10/07/2020 at 0947.Hold Reason: Transfer to a Procedural area.)   500 mg Oral Every 8 hours 10/06/20 2224     10/06/20 2215  metroNIDAZOLE (FLAGYL) IVPB 500 mg  Status:  Discontinued        500 mg 100 mL/hr over 60 Minutes Intravenous Every 8 hours 10/06/20 2201 10/06/20 2224   10/06/20 2015  vancomycin (VANCOCIN) IVPB 1000 mg/200 mL premix  Status:  Discontinued        1,000 mg 200 mL/hr over 60 Minutes Intravenous  Once 10/06/20 2001 10/06/20 2011   10/06/20 2015  ceFEPIme (MAXIPIME) 1 g in sodium chloride 0.9 % 100 mL IVPB  Status:  Discontinued        1 g 200 mL/hr over 30 Minutes Intravenous  Once 10/06/20 2001 10/06/20 2011   10/06/20 2015  ceFEPIme (MAXIPIME) 2 g in sodium chloride 0.9 % 100 mL IVPB        2 g 200 mL/hr over 30 Minutes Intravenous  Once 10/06/20 2011 10/06/20 2234   10/06/20 2015  vancomycin (VANCOREADY) IVPB 1500 mg/300 mL        1,500 mg 150 mL/hr over 120 Minutes Intravenous  Once 10/06/20 2011 10/07/20 0146        Subjective:  Seen this morning prior to procedure.  Reported ongoing pain in right lower extremity.  Denied chest pain, dyspnea or palpitations.  Reports ongoing smoking of up to a pack per day.  States that she has not been taking most of her meds except pain medications because her spouse will not assist her.  Reports feeling cold.  Objective:   Vitals:   10/07/20 1230 10/07/20 1245 10/07/20 1300 10/07/20 1315  BP: (!) 104/56 (!) 90/59 (!) 97/52 (!) 98/54  Pulse: 86 82  81 78  Resp: 14 12 12 14   Temp:      TempSrc:      SpO2: 95% 97% 92% 95%  Weight:      Height:        General exam: Middle-age female, moderately built and nourished, lying comfortably supine in bed covered with multiple sheets. Respiratory system: Clear to auscultation. Respiratory effort normal. Cardiovascular system: S1 & S2 heard, RRR. No JVD, murmurs, rubs, gallops or clicks. No pedal edema.  Telemetry personally reviewed: Sinus rhythm. Gastrointestinal system: Abdomen is nondistended, soft and nontender. No organomegaly or masses felt. Normal bowel sounds heard. Central nervous system: Alert and oriented. No focal neurological deficits. Extremities: Symmetric 5 x 5 power.  Healed left AKA stump.  Mild edema  and faint erythema extending from lower leg down to the foot.  Large ulcer with gangrenous changes of the right great toe and medial aspect of the right forefoot-best delineated in the pictures below on admission.  Foul odor on stepping into patient's room.  No active drainage.  No fluctuation or crepitus appreciated.  Difficulty palpating posterior tibial and dorsalis pedis. Skin: As above. Psychiatry: Judgement and insight appear normal. Mood & affect appropriate.         Data Reviewed:   I have personally reviewed following labs and imaging studies   CBC: Recent Labs  Lab 10/06/20 1343 10/07/20 0153  WBC 8.4 6.3  NEUTROABS 7.1 4.5  HGB 8.5* 7.9*  HCT 29.1* 27.2*  MCV 85.1 85.5  PLT 285 625    Basic Metabolic Panel: Recent Labs  Lab 10/06/20 1343 10/07/20 0153  NA 132* 134*  K 3.7 3.6  CL 101 101  CO2 19* 21*  GLUCOSE 169* 132*  BUN 37* 36*  CREATININE 2.42* 2.34*  CALCIUM 8.0* 7.8*    Liver Function Tests: Recent Labs  Lab 10/06/20 1343 10/07/20 0153  AST 9* 10*  ALT 8 8  ALKPHOS 95 85  BILITOT 0.5 0.5  PROT 6.7 6.2*  ALBUMIN 2.4* 2.2*    CBG: Recent Labs  Lab 10/07/20 0702 10/07/20 0858 10/07/20 1312  GLUCAP 104* 123* 144*     Microbiology Studies:   Recent Results (from the past 240 hour(s))  Blood culture (routine x 2)     Status: None (Preliminary result)   Collection Time: 10/06/20  8:08 PM   Specimen: BLOOD  Result Value Ref Range Status   Specimen Description BLOOD RIGHT ANTECUBITAL  Final   Special Requests   Final    BOTTLES DRAWN AEROBIC AND ANAEROBIC Blood Culture results may not be optimal due to an inadequate volume of blood received in culture bottles   Culture   Final    NO GROWTH < 12 HOURS Performed at [facility] [address] [place], [place] [address]    Report Status PENDING  Incomplete  Blood culture (routine x 2)     Status: None (Preliminary result)   Collection Time: 10/06/20  8:36 PM   Specimen: BLOOD LEFT FOREARM  Result Value Ref Range Status   Specimen Description BLOOD LEFT FOREARM  Final   Special Requests   Final    BOTTLES DRAWN AEROBIC AND ANAEROBIC Blood Culture results may not be optimal due to an inadequate volume of blood received in culture bottles   Culture   Final    NO GROWTH < 24 HOURS Performed at [facility], [place] [address], [place], [place] [address]    Report Status PENDING  Incomplete  Respiratory Panel by RT PCR (Flu A&B, Covid) - Nasopharyngeal Swab     Status: None   Collection Time: 10/06/20  8:50 PM   Specimen: Nasopharyngeal Swab; Nasopharyngeal(NP) swabs in vial transport medium  Result Value Ref Range Status   SARS Coronavirus 2 by RT PCR NEGATIVE NEGATIVE Final    Comment: (NOTE) SARS-CoV-2 target nucleic acids are NOT DETECTED.  The SARS-CoV-2 RNA is generally detectable in upper respiratoy specimens during the acute phase of infection. The lowest concentration of SARS-CoV-2 viral copies this assay can detect is 131 copies/mL. A negative result does not preclude SARS-Cov-2 infection and should not be used as the sole basis for treatment or other patient management decisions. A negative result may occur with  improper  specimen  collection/handling, submission of specimen other than nasopharyngeal swab, presence of viral mutation(s) within the areas targeted by this assay, and inadequate number of viral copies (<131 copies/mL). A negative result must be combined with clinical observations, patient history, and epidemiological information. The expected result is Negative.  Fact Sheet for Patients:  PinkCheek.be  Fact Sheet for Healthcare Providers:  GravelBags.it  This test is no t yet approved or cleared by the [place] FDA and  has been authorized for detection and/or diagnosis of SARS-CoV-2 by FDA under an Emergency Use Authorization (EUA). This EUA will remain  in effect (meaning this test can be used) for the duration of the COVID-19 declaration under Section 564(b)(1) of the Act, 21 U.S.C. section 360bbb-3(b)(1), unless the authorization is terminated or revoked sooner.     Influenza A by PCR NEGATIVE NEGATIVE Final   Influenza B by PCR NEGATIVE NEGATIVE Final    Comment: (NOTE) The Xpert Xpress SARS-CoV-2/FLU/RSV assay is intended as an aid in  the diagnosis of influenza from Nasopharyngeal swab specimens and  should not be used as a sole basis for treatment. Nasal washings and  aspirates are unacceptable for Xpert Xpress SARS-CoV-2/FLU/RSV  testing.  Fact Sheet for Patients: PinkCheek.be  Fact Sheet for Healthcare Providers: GravelBags.it  This test is not yet approved or cleared by the [place] FDA and  has been authorized for detection and/or diagnosis of SARS-CoV-2 by  FDA under an Emergency Use Authorization (EUA). This EUA will remain  in effect (meaning this test can be used) for the duration of the  Covid-19 declaration under Section 564(b)(1) of the Act, 21  U.S.C. section 360bbb-3(b)(1), unless the authorization is  terminated or revoked. Performed at  [facility], [place] [address], [place], [place] [address]      Radiology Studies:  DG Foot Complete Right  Result Date: 10/06/2020 CLINICAL DATA:  Right foot fell over and pain EXAM: RIGHT FOOT COMPLETE - 3+ VIEW COMPARISON:  None. FINDINGS: There is an obliquely oriented mildly displaced fracture seen through the mid first metatarsal. Large area of ulceration overlying the first metatarsal with subcutaneous emphysema and soft tissue swelling. There is area of mottled cortical irregularity seen throughout the first metatarsal head and phalanges. Significant soft tissue swelling seen diffusely around the the dorsum of the foot. IMPRESSION: Mildly displaced fracture seen through the first metatarsal. large area of overlying ulceration and findings that could be suggestive of osteomyelitis. Electronically Signed   By: Prudencio Pair M.D.   On: 10/06/2020 20:38     Scheduled Meds:   . [MAR Hold] amitriptyline  25 mg Oral QHS  . fentaNYL      . [MAR Hold] ferrous sulfate  325 mg Oral BID WC  . [MAR Hold] insulin aspart  0-5 Units Subcutaneous QHS  . [MAR Hold] insulin aspart  0-9 Units Subcutaneous TID WC  . [MAR Hold] metroNIDAZOLE  500 mg Oral Q8H  . [MAR Hold] nicotine  21 mg Transdermal Daily  . [MAR Hold] pantoprazole  40 mg Oral Daily  . [MAR Hold] pregabalin  50 mg Oral BID  . [MAR Hold] rosuvastatin  20 mg Oral q1800    Continuous Infusions:   . sodium chloride 50 mL/hr at 10/07/20 1052  . [MAR Hold] ceFEPime (MAXIPIME) IV    . [MAR Hold] vancomycin       LOS: 1 day     Vernell Leep, MD, [place], [facility]. [name]    To contact the attending provider between  7A-7P or the covering provider during after hours 7P-7A, please log into the web site [email or web address] and access using universal [place] password for that web site. If you do not have the password, please call the hospital operator.  10/07/2020, 1:42 PM

## 2020-10-07 NOTE — ED Notes (Signed)
Pt c/o being cold. Heat adjusted and warm blankets applied. Pending surgery this am. Will give report to OR once notified.

## 2020-10-08 DIAGNOSIS — I739 Peripheral vascular disease, unspecified: Secondary | ICD-10-CM | POA: Diagnosis not present

## 2020-10-08 DIAGNOSIS — I480 Paroxysmal atrial fibrillation: Secondary | ICD-10-CM

## 2020-10-08 DIAGNOSIS — E1152 Type 2 diabetes mellitus with diabetic peripheral angiopathy with gangrene: Secondary | ICD-10-CM

## 2020-10-08 DIAGNOSIS — Z794 Long term (current) use of insulin: Secondary | ICD-10-CM

## 2020-10-08 DIAGNOSIS — M869 Osteomyelitis, unspecified: Secondary | ICD-10-CM | POA: Diagnosis not present

## 2020-10-08 LAB — CBC WITH DIFFERENTIAL/PLATELET
Abs Immature Granulocytes: 0.05 10*3/uL (ref 0.00–0.07)
Basophils Absolute: 0 10*3/uL (ref 0.0–0.1)
Basophils Relative: 0 %
Eosinophils Absolute: 0 10*3/uL (ref 0.0–0.5)
Eosinophils Relative: 0 %
HCT: 28.4 % — ABNORMAL LOW (ref 36.0–46.0)
Hemoglobin: 8.5 g/dL — ABNORMAL LOW (ref 12.0–15.0)
Immature Granulocytes: 1 %
Lymphocytes Relative: 8 %
Lymphs Abs: 0.5 10*3/uL — ABNORMAL LOW (ref 0.7–4.0)
MCH: 25.8 pg — ABNORMAL LOW (ref 26.0–34.0)
MCHC: 29.9 g/dL — ABNORMAL LOW (ref 30.0–36.0)
MCV: 86.3 fL (ref 80.0–100.0)
Monocytes Absolute: 0.8 10*3/uL (ref 0.1–1.0)
Monocytes Relative: 13 %
Neutro Abs: 4.8 10*3/uL (ref 1.7–7.7)
Neutrophils Relative %: 78 %
Platelets: 262 10*3/uL (ref 150–400)
RBC: 3.29 MIL/uL — ABNORMAL LOW (ref 3.87–5.11)
RDW: 15.8 % — ABNORMAL HIGH (ref 11.5–15.5)
WBC: 6.2 10*3/uL (ref 4.0–10.5)
nRBC: 0 % (ref 0.0–0.2)

## 2020-10-08 LAB — COMPREHENSIVE METABOLIC PANEL
ALT: 7 U/L (ref 0–44)
AST: 13 U/L — ABNORMAL LOW (ref 15–41)
Albumin: 2.2 g/dL — ABNORMAL LOW (ref 3.5–5.0)
Alkaline Phosphatase: 81 U/L (ref 38–126)
Anion gap: 11 (ref 5–15)
BUN: 40 mg/dL — ABNORMAL HIGH (ref 6–20)
CO2: 19 mmol/L — ABNORMAL LOW (ref 22–32)
Calcium: 7.9 mg/dL — ABNORMAL LOW (ref 8.9–10.3)
Chloride: 107 mmol/L (ref 98–111)
Creatinine, Ser: 2.36 mg/dL — ABNORMAL HIGH (ref 0.44–1.00)
GFR, Estimated: 24 mL/min — ABNORMAL LOW (ref >60)
Glucose, Bld: 219 mg/dL — ABNORMAL HIGH (ref 70–99)
Potassium: 3.8 mmol/L (ref 3.5–5.1)
Sodium: 137 mmol/L (ref 135–145)
Total Bilirubin: 0.5 mg/dL (ref 0.3–1.2)
Total Protein: 6 g/dL — ABNORMAL LOW (ref 6.5–8.1)

## 2020-10-08 LAB — GLUCOSE, CAPILLARY
Glucose-Capillary: 130 mg/dL — ABNORMAL HIGH (ref 70–99)
Glucose-Capillary: 137 mg/dL — ABNORMAL HIGH (ref 70–99)
Glucose-Capillary: 184 mg/dL — ABNORMAL HIGH (ref 70–99)
Glucose-Capillary: 195 mg/dL — ABNORMAL HIGH (ref 70–99)
Glucose-Capillary: 247 mg/dL — ABNORMAL HIGH (ref 70–99)

## 2020-10-08 LAB — HIV ANTIBODY (ROUTINE TESTING W REFLEX): HIV Screen 4th Generation wRfx: NONREACTIVE

## 2020-10-08 MED ORDER — INSULIN GLARGINE 100 UNIT/ML ~~LOC~~ SOLN
10.0000 [IU] | Freq: Every day | SUBCUTANEOUS | Status: DC
  Administered 2020-10-08 – 2020-10-10 (×3): 10 [IU] via SUBCUTANEOUS
  Filled 2020-10-08 (×5): qty 0.1

## 2020-10-08 MED ORDER — RIVAROXABAN 15 MG PO TABS
15.0000 mg | ORAL_TABLET | Freq: Every day | ORAL | Status: DC
  Administered 2020-10-08 – 2020-10-10 (×3): 15 mg via ORAL
  Filled 2020-10-08 (×5): qty 1

## 2020-10-08 MED ORDER — METOPROLOL TARTRATE 12.5 MG HALF TABLET
12.5000 mg | ORAL_TABLET | Freq: Two times a day (BID) | ORAL | Status: DC
  Administered 2020-10-08 – 2020-10-11 (×5): 12.5 mg via ORAL
  Filled 2020-10-08 (×7): qty 1

## 2020-10-08 MED ORDER — RIVAROXABAN 20 MG PO TABS: 20.0000 mg | ORAL_TABLET | Freq: Every day | ORAL | Status: DC

## 2020-10-08 NOTE — Evaluation (Signed)
Occupational Therapy Evaluation Patient Details Name: [name] MRN: [id number] DOB: Jul 20, 1966 Today's Date: 10/08/2020    History of Present Illness 54 y.o. female With long-standing uncontrolled diabetes and vasculopathy presented to the emergency department with systemic symptoms consistent with sepsis and evidence of wet gangrene of her right foot medially and dorsally and plantarly. Now s/p BKA with VAC placement PMhx: T2DM.   Clinical Impression   Pt PTA: Pt living at home and using [name] for stand pivots. Pt reports independence for ADL and mobility. Pt legally blind. Pt currently b/l amputee, decreased strength, decreased ability to care for self and legally blindness. Pt set-upA to maxA for ADL; mobility limited by scooting at this time. Pt would greatly benefit from continued OT skilled services. Pt very motivated to learn to walk again once prosthetics are appropriate. OT following acutely.    Follow Up Recommendations  CIR;Supervision/Assistance - 24 hour    Equipment Recommendations  None recommended by OT    Recommendations for Other Services       Precautions / Restrictions Precautions Precautions: Fall Restrictions Weight Bearing Restrictions: Yes LLE Weight Bearing: Non weight bearing      Mobility Bed Mobility Overal bed mobility: Needs Assistance Bed Mobility: Supine to Sit     Supine to sit: Supervision     General bed mobility comments: heavy use of rail    Transfers                 General transfer comment: DNT    Balance Overall balance assessment: Needs assistance   Sitting balance-Leahy Scale: Good                                     ADL either performed or assessed with clinical judgement   ADL Overall ADL's : Needs assistance/impaired Eating/Feeding: Set up;Sitting   Grooming: Set up   Upper Body Bathing: Set up   Lower Body Bathing: Moderate assistance   Upper Body Dressing : Set up   Lower Body  Dressing: Moderate assistance   Toilet Transfer: Set up   [place] and Hygiene: Maximal assistance       Functional mobility during ADLs: Moderate assistance General ADL Comments: Pt limited by b/l amputee, decreased strength, decreased ability to care for self and legally blindness.     Vision Baseline Vision/History: Legally blind Patient Visual Report: No change from baseline Additional Comments: Very difficult to see anything     Perception     Praxis      Pertinent Vitals/Pain Pain Assessment: Faces Faces Pain Scale: Hurts little more Pain Location: R stump Pain Descriptors / Indicators: Discomfort;Tender;Sharp Pain Intervention(s): Monitored during session;Premedicated before session;Repositioned     Hand Dominance Right   Extremity/Trunk Assessment Upper Extremity Assessment Upper Extremity Assessment: Generalized weakness   Lower Extremity Assessment Lower Extremity Assessment: [name] deficits/detail;LLE deficits/detail [name] Deficits / Details: s/p BKA LLE Deficits / Details: s/p BKA   Cervical / Trunk Assessment Cervical / Trunk Assessment: Normal   Communication Communication Communication: No difficulties   Cognition Arousal/Alertness: Awake/alert Behavior During Therapy: WFL for tasks assessed/performed Overall Cognitive Status: Within Functional Limits for tasks assessed                                     General Comments  Wanting information on services for the  blind    Exercises     Shoulder Instructions      Home Living Family/patient expects to be discharged to:: Private residence Living Arrangements: Spouse/significant other Available Help at Discharge: Family;Available 24 hours/day Type of Home: House Home Access: Ramped entrance     Home Layout: One level     Bathroom Shower/Tub: Teacher, early years/pre: Standard     Home Equipment: Bedside commode;Wheelchair - [employer] -  2 wheels;Cane - single point   Additional Comments: has L prosthethic      Prior Functioning/Environment Level of Independence: Independent with assistive device(s)        Comments: Pt legally blind and requiring resources for blindness, but unable to always find help        OT Problem List: Decreased strength;Decreased activity tolerance;Impaired balance (sitting and/or standing);Decreased safety awareness;Pain;Increased edema;Decreased knowledge of use of DME or AE;Cardiopulmonary status limiting activity      OT Treatment/Interventions: Self-care/ADL training;Therapeutic exercise;Energy conservation;Therapeutic activities;Patient/family education;Balance training;DME and/or AE instruction    OT Goals(Current goals can be found in the care plan section) Acute Rehab OT Goals Patient Stated Goal: to get out of bed OT Goal Formulation: With patient Time For Goal Achievement: 10/22/20 Potential to Achieve Goals: Good ADL Goals Pt Will Perform Grooming: with modified independence;sitting Pt Will Perform Lower Body Dressing: with supervision;sitting/lateral leans;bed level Pt Will Transfer to Toilet: with min guard assist;with transfer board;anterior/posterior transfer Pt/caregiver will Perform Home Exercise Program: Increased strength;Both right and left upper extremity;With Supervision Additional ADL Goal #1: Pt will perform ADL functional transfers with supervisionA for safety.  OT Frequency: Min 2X/week   Barriers to D/C:            Co-evaluation              AM-PAC OT "6 Clicks" Daily Activity     Outcome Measure Help from another person eating meals?: None Help from another person taking care of personal grooming?: A Little Help from another person toileting, which includes using toliet, bedpan, or urinal?: A Lot Help from another person bathing (including washing, rinsing, drying)?: A Lot Help from another person to put on and taking off regular upper body  clothing?: A Little Help from another person to put on and taking off regular lower body clothing?: A Lot 6 Click Score: 16   End of Session Nurse Communication: Mobility status  Activity Tolerance: Patient tolerated treatment well Patient left: in bed;with call bell/phone within reach;with bed alarm set  OT Visit Diagnosis: Muscle weakness (generalized) (M62.81);Pain;Other abnormalities of gait and mobility (R26.89) Pain - Right/Left: Right Pain - part of body: Leg                Time: 1448-1856 OT Time Calculation (min): 30 min Charges:  OT General Charges $OT Visit: 1 Visit OT Evaluation $OT Eval Moderate Complexity: 1 Mod  Jefferey Pica, OTR/L Acute Rehabilitation Services Pager: [phone] Office: [place] 10/08/2020, 3:07 PM

## 2020-10-08 NOTE — Progress Notes (Addendum)
PROGRESS NOTE    [name]   CBJ:[id number]  DOB: 11/08/1966  DOA: 10/06/2020 PCP: Frazier Richards, MD   Brief Narrative:  [name] is a 54 year old female who lives with her spouse and uses a wheelchair and has the following medical problems: type II DM with peripheral neuropathy, GERD, hyperlipidemia, paroxysmal atrial fibrillation on Xarelto, PAD, s/p left AKA, ischemic cardiomyopathy, ongoing tobacco use disorder, bilateral carotid artery stenosis, presented to ED due to worsening chronic right foot ulcer with associated foul smell.  Admitted for right first toe/right medial foot wet gangrene, osteomyelitis and cellulitis complicating chronic wound, DM with peripheral neuropathy and PAD.  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   Subjective: Having some complaints of back pain today.  No complaint of pain at the site of BKA.    Assessment & Plan:   Principal Problem: Right foot osteomyelitis with wet gangrene and cellulitis in setting of diabetic peripheral neuropathy and PAD - The patient underwent a BKA on 11/20 and appears to be recuperating well. -Continue management including antibiotics per orthopedic surgery -Currently receiving IV vancomycin and cefepime and oral Flagyl -Per orthopedic surgery, wound VAC to be removed 3 days postop after which the patient can be discharged home if stable  Active Problems:   Peripheral vascular disease, unspecified  Left AKA -Has undergone many lower extremity surgeries including bypass surgery in the past -Continue Crestor     DM2 (diabetes mellitus, type 2) with peripheral neuropathy and retinopathy -Patient is on Lantus and NovoLog at home according to the med rec -In the hospital she is on a sliding scale which is being continued -Blood sugar in the 1-200 range-I will add Lantus to be started this evening follow sugars -Continue Lyrica    Paroxysmal atrial fibrillation - in NSR at this time -Continue  metoprolol with holding parameters to prevent RVR - resume Xarelto tonight per ortho (she takes it in evenings)-I discussed this with Dr. 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   Dispo:  Patient From: Home  Planned Disposition: Home with Health Care [facility]  Expected discharge date: 10/09/20  Medically stable for discharge: No       Consultants:   Ortho Procedures:   Left BKA Antimicrobials:  Anti-infectives (From admission, onward)   Start     Dose/Rate Route Frequency Ordered Stop   10/08/20 2300  vancomycin (VANCOREADY) IVPB 1250 mg/250 mL        1,250 mg 166.7 mL/hr over 90 Minutes Intravenous Every 48 hours 10/06/20 2221     10/08/20 0600  ceFAZolin (ANCEF) IVPB 2g/100 mL premix  Status:  Discontinued        2 g 200 mL/hr over 30 Minutes Intravenous On call to O.R. 10/07/20 1423 10/07/20 1430   10/07/20 2130  ceFEPIme (MAXIPIME) 2 g in sodium chloride 0.9 % 100 mL IVPB        2 g 200 mL/hr over 30 Minutes Intravenous Every 24 hours 10/06/20 2221     10/06/20 2230  metroNIDAZOLE (  FLAGYL) tablet 500 mg        500 mg Oral Every 8 hours 10/06/20 2224     10/06/20 2215  metroNIDAZOLE (FLAGYL) IVPB 500 mg  Status:  Discontinued        500 mg 100 mL/hr over 60 Minutes Intravenous Every 8 hours 10/06/20 2201 10/06/20 2224   10/06/20 2015  vancomycin (VANCOCIN) IVPB 1000 mg/200 mL premix  Status:  Discontinued        1,000 mg 200 mL/hr over 60 Minutes Intravenous  Once  10/06/20 2001 10/06/20 2011   10/06/20 2015  ceFEPIme (MAXIPIME) 1 g in sodium chloride 0.9 % 100 mL IVPB  Status:  Discontinued        1 g 200 mL/hr over 30 Minutes Intravenous  Once 10/06/20 2001 10/06/20 2011   10/06/20 2015  ceFEPIme (MAXIPIME) 2 g in sodium chloride 0.9 % 100 mL IVPB        2 g 200 mL/hr over 30 Minutes Intravenous  Once 10/06/20 2011 10/06/20 2234   10/06/20 2015  vancomycin (VANCOREADY) IVPB 1500 mg/300 mL        1,500 mg 150 mL/hr over 120 Minutes Intravenous  Once 10/06/20 2011 10/07/20 0146       Objective: Vitals:   10/07/20 1900 10/08/20 0009 10/08/20 0300 10/08/20 0758  BP: (!) 91/53 92/68 (!) 96/56 99/68  Pulse: 90 76 77 75  Resp: 18 16 17 16   Temp: 98.3 F (36.8 C) 98.2 F (36.8 C) 97.8 F (36.6 C) 98 F (36.7 C)  TempSrc: Oral Oral Oral Oral  SpO2: 95% 94% 93% 95%  Weight:      Height:        Intake/Output Summary (Last 24 hours) at 10/08/2020 1142 Last data filed at 10/08/2020 1035 Gross per 24 hour  Intake 2687.83 ml  Output 50 ml  Net 2637.83 ml   Filed Weights   10/06/20 2200  Weight: 72.6 kg    Examination: General exam: Appears comfortable  HEENT: PERRLA, oral mucosa moist, no sclera icterus or thrush Respiratory system: Clear to auscultation. Respiratory effort normal. Cardiovascular system: S1 & S2 heard, RRR.   Gastrointestinal system: Abdomen soft, non-tender, nondistended. Normal bowel sounds. Central nervous system: Alert and oriented. No focal neurological deficits. Extremities: No cyanosis, clubbing or edema-right BKA with wound VAC present Skin: No rashes or ulcers Psychiatry:  Mood & affect appropriate.     Data Reviewed: I have personally reviewed following labs and imaging studies  CBC: Recent Labs  Lab 10/06/20 1343 10/07/20 0153 10/08/20 0647  WBC 8.4 6.3 6.2  NEUTROABS 7.1 4.5 4.8  HGB 8.5* 7.9* 8.5*  HCT 29.1* 27.2* 28.4*  MCV 85.1 85.5 86.3  PLT 285 253 458   Basic Metabolic Panel: Recent  Labs  Lab 10/06/20 1343 10/07/20 0153 10/08/20 0647  NA 132* 134* 137  K 3.7 3.6 3.8  CL 101 101 107  CO2 19* 21* 19*  GLUCOSE 169* 132* 219*  BUN 37* 36* 40*  CREATININE 2.42* 2.34* 2.36*  CALCIUM 8.0* 7.8* 7.9*   GFR: Estimated Creatinine Clearance: 25.9 mL/min (A) (by C-G formula based on SCr of 2.36 mg/dL (H)). Liver Function Tests: Recent Labs  Lab 10/06/20 1343 10/07/20 0153 10/08/20 0647  AST 9* 10* 13*  ALT 8 8 7   ALKPHOS 95 85 81  BILITOT 0.5 0.5 0.5  PROT 6.7 6.2* 6.0*  ALBUMIN 2.4* 2.2* 2.2*   No results for input(s): LIPASE, AMYLASE in the last 168 hours. No results for  input(s): AMMONIA in the last 168 hours. Coagulation Profile: No results for input(s): INR, PROTIME in the last 168 hours. Cardiac Enzymes: No results for input(s): CKTOTAL, CKMB, CKMBINDEX, TROPONINI in the last 168 hours. BNP (last 3 results) No results for input(s): PROBNP in the last 8760 hours. HbA1C: No results for input(s): HGBA1C in the last 72 hours. CBG: Recent Labs  Lab 10/07/20 1655 10/07/20 1934 10/08/20 0004 10/08/20 0640 10/08/20 1123  GLUCAP 210* 241* 247* 184* 195*   Lipid Profile: No results for input(s): CHOL, HDL, LDLCALC, TRIG, CHOLHDL, LDLDIRECT in the last 72 hours. Thyroid Function Tests: No results for input(s): TSH, T4TOTAL, FREET4, T3FREE, THYROIDAB in the last 72 hours. Anemia Panel: No results for input(s): VITAMINB12, FOLATE, FERRITIN, TIBC, IRON, RETICCTPCT in the last 72 hours. Urine analysis:    Component Value Date/Time   COLORURINE YELLOW 04/25/2018 0407   APPEARANCEUR CLOUDY (A) 04/25/2018 0407   LABSPEC 1.012 04/25/2018 0407   PHURINE 5.0 04/25/2018 0407   GLUCOSEU NEGATIVE 04/25/2018 0407   HGBUR MODERATE (A) 04/25/2018 0407   BILIRUBINUR NEGATIVE 04/25/2018 0407   KETONESUR NEGATIVE 04/25/2018 0407   PROTEINUR 100 (A) 04/25/2018 0407   UROBILINOGEN 0.2 06/25/2011 1707   NITRITE NEGATIVE 04/25/2018 0407   LEUKOCYTESUR NEGATIVE  04/25/2018 0407   Sepsis Labs: @LABRCNTIP (procalcitonin:4,lacticidven:4) ) Recent Results (from the past 240 hour(s))  Blood culture (routine x 2)     Status: None (Preliminary result)   Collection Time: 10/06/20  8:08 PM   Specimen: BLOOD  Result Value Ref Range Status   Specimen Description BLOOD RIGHT ANTECUBITAL  Final   Special Requests   Final    BOTTLES DRAWN AEROBIC AND ANAEROBIC Blood Culture results may not be optimal due to an inadequate volume of blood received in culture bottles   Culture   Final    NO GROWTH 2 DAYS Performed at [facility], [place] [address], [place], [place] [address]    Report Status PENDING  Incomplete  Blood culture (routine x 2)     Status: None (Preliminary result)   Collection Time: 10/06/20  8:36 PM   Specimen: BLOOD LEFT FOREARM  Result Value Ref Range Status   Specimen Description BLOOD LEFT FOREARM  Final   Special Requests   Final    BOTTLES DRAWN AEROBIC AND ANAEROBIC Blood Culture results may not be optimal due to an inadequate volume of blood received in culture bottles   Culture   Final    NO GROWTH 2 DAYS Performed at [facility], [place] [address], [place], [place] [address]    Report Status PENDING  Incomplete  Respiratory Panel by RT PCR (Flu A&B, Covid) - Nasopharyngeal Swab     Status: None   Collection Time: 10/06/20  8:50 PM   Specimen: Nasopharyngeal Swab; Nasopharyngeal(NP) swabs in vial transport medium  Result Value Ref Range Status   SARS Coronavirus 2 by RT PCR NEGATIVE NEGATIVE Final    Comment: (NOTE) SARS-CoV-2 target nucleic acids are NOT DETECTED.  The SARS-CoV-2 RNA is generally detectable in upper respiratoy specimens during the acute phase of infection. The lowest concentration of SARS-CoV-2 viral copies this assay can detect is 131 copies/mL. A negative result does not preclude SARS-Cov-2 infection and should not be used as the sole basis for treatment or other patient management decisions. A  negative result may occur with  improper specimen collection/handling, submission of specimen other than nasopharyngeal swab, presence of viral mutation(s) within the areas targeted by this assay,  and inadequate number of viral copies (<131 copies/mL). A negative result must be combined with clinical observations, patient history, and epidemiological information. The expected result is Negative.  Fact Sheet for Patients:  PinkCheek.be  Fact Sheet for Healthcare Providers:  GravelBags.it  This test is no t yet approved or cleared by the [place] FDA and  has been authorized for detection and/or diagnosis of SARS-CoV-2 by FDA under an Emergency Use Authorization (EUA). This EUA will remain  in effect (meaning this test can be used) for the duration of the COVID-19 declaration under Section 564(b)(1) of the Act, 21 U.S.C. section 360bbb-3(b)(1), unless the authorization is terminated or revoked sooner.     Influenza A by PCR NEGATIVE NEGATIVE Final   Influenza B by PCR NEGATIVE NEGATIVE Final    Comment: (NOTE) The Xpert Xpress SARS-CoV-2/FLU/RSV assay is intended as an aid in  the diagnosis of influenza from Nasopharyngeal swab specimens and  should not be used as a sole basis for treatment. Nasal washings and  aspirates are unacceptable for Xpert Xpress SARS-CoV-2/FLU/RSV  testing.  Fact Sheet for Patients: PinkCheek.be  Fact Sheet for Healthcare Providers: GravelBags.it  This test is not yet approved or cleared by the [place] FDA and  has been authorized for detection and/or diagnosis of SARS-CoV-2 by  FDA under an Emergency Use Authorization (EUA). This EUA will remain  in effect (meaning this test can be used) for the duration of the  Covid-19 declaration under Section 564(b)(1) of the Act, 21  U.S.C. section 360bbb-3(b)(1), unless the authorization  is  terminated or revoked. Performed at [facility], [place] [address], [place], [place] [address]          Radiology Studies: DG Foot Complete Right  Result Date: 10/06/2020 CLINICAL DATA:  Right foot fell over and pain EXAM: RIGHT FOOT COMPLETE - 3+ VIEW COMPARISON:  None. FINDINGS: There is an obliquely oriented mildly displaced fracture seen through the mid first metatarsal. Large area of ulceration overlying the first metatarsal with subcutaneous emphysema and soft tissue swelling. There is area of mottled cortical irregularity seen throughout the first metatarsal head and phalanges. Significant soft tissue swelling seen diffusely around the the dorsum of the foot. IMPRESSION: Mildly displaced fracture seen through the first metatarsal. large area of overlying ulceration and findings that could be suggestive of osteomyelitis. Electronically Signed   By: Prudencio Pair M.D.   On: 10/06/2020 20:38      Scheduled Meds:  amitriptyline  25 mg Oral QHS   ferrous sulfate  325 mg Oral BID WC   insulin aspart  0-5 Units Subcutaneous QHS   insulin aspart  0-9 Units Subcutaneous TID WC   metroNIDAZOLE  500 mg Oral Q8H   nicotine  21 mg Transdermal Daily   pantoprazole  40 mg Oral Daily   pregabalin  50 mg Oral BID   rosuvastatin  20 mg Oral q1800   Continuous Infusions:  sodium chloride 50 mL/hr at 10/07/20 1436   ceFEPime (MAXIPIME) IV 2 g (10/07/20 2235)   vancomycin       LOS: 2 days      Debbe Odea, MD [name] Pager: [email or web address] 10/08/2020, 11:42 AM

## 2020-10-08 NOTE — Progress Notes (Signed)
     [name] is a 54 y.o. female   Orthopaedic diagnosis: POD #1 status post right below the knee amputation  Subjective: She is resting comfortably today.  Denies pain.  She is thirsty.  Denies fevers or chills.  Says she feels better after the surgery.    Objectyive: Vitals:   10/08/20 0300 10/08/20 0758  BP: (!) 96/56 99/68  Pulse: 77 75  Resp: 17 16  Temp: 97.8 F (36.6 C) 98 F (36.7 C)  SpO2: 93% 95%     Exam: Awake and alert Respirations even and unlabored No acute distress  Right lower extremity with wound VAC in place about the amputation site.  Wound VAC is with good seal.  Some serosanguineous type drainage in the wound VAC canister.  Assessment: POD #1 status post right below the knee amputation for severe foot osteomyelitis and gangrene   Plan: We will continue with the wound VAC until postoperative day 3.  We will discontinue at that time and assess for need for home wound VAC therapy. I would recommend continuing antibiotics and treatment for cellulitis as she had continued cellulitic tissues after the amputation. Patient will be suitable for discharge from orthopedic standpoint once we remove this initial wound VAC and decide on continued dressings for her amputation site.  We will plan for close follow-up as an outpatient after discharge. She should work with physical therapy for mobilization. DVT prophylaxis per hospitalist team.   Radene Journey, MD

## 2020-10-08 NOTE — Evaluation (Signed)
Physical Therapy Evaluation Patient Details Name: [name] MRN: [id number] DOB: May 18, 1966 Today's Date: 10/08/2020   History of Present Illness  54 y.o. female With long-standing uncontrolled diabetes and vasculopathy presented to the emergency department with systemic symptoms consistent with sepsis and evidence of wet gangrene of her right foot medially and dorsally and plantarly. Now s/p BKA with VAC placement  Clinical Impression   Patient is s/p above surgery resulting in functional limitations due to the deficits listed below (see PT Problem List). Comes from home where she lives with her husband (though unsure how helpful her husband is) in a single family home with a ramped entrance; Fiercely independent at baseline, uses assistive devices for mobility; Presents to PT with [name] pain, functional dependencies; Tells me she has had great outcomes from post-acute rehab at [facility], and hopes to return to maximize independence and safety with mobility; Her ultimate goal is to return to walking with prostheses;  Patient will benefit from skilled PT to increase their independence and safety with mobility to allow discharge to the venue listed below.       Follow Up Recommendations CIR    Equipment Recommendations  Other (comment) (TBD; pretty well equipped)    Recommendations for Other Services       Precautions / Restrictions Precautions Precautions: Fall Restrictions LLE Weight Bearing: Non weight bearing      Mobility  Bed Mobility Overal bed mobility: Needs Assistance Bed Mobility: Sit to Supine       Sit to supine: Min guard   General bed mobility comments: Able to scoot and turn body, then lay back to supine via bilateral elbow prop    Transfers Overall transfer level: Needs assistance   Transfers: Lateral/Scoot Transfers          Lateral/Scoot Transfers: Min guard General transfer comment: Simulated lateral scoot transfers in the bed, scooting  [email or web address]  Ambulation/Gait                Stairs            Wheelchair Mobility    Modified Rankin (Stroke Patients Only)       Balance     Sitting balance-Leahy Scale: Good                                       Pertinent Vitals/Pain Pain Assessment: Faces Faces Pain Scale: Hurts little more Pain Location: R residual limb Pain Descriptors / Indicators: Discomfort;Tender;Sharp Pain Intervention(s): Monitored during session    Home Living Family/patient expects to be discharged to:: Private residence Living Arrangements: Spouse/significant other Available Help at Discharge: Family;Available 24 hours/day Type of Home: House Home Access: Ramped entrance     Home Layout: One level Home Equipment: Bedside commode;Wheelchair - [employer] - 2 wheels;Cane - single point Additional Comments: has L prosthethic    Prior Function Level of Independence: Independent with assistive device(s)         Comments: Pt legally blind and requiring resources for blindness, but unable to always find help     Hand Dominance   Dominant Hand: Right    Extremity/Trunk Assessment   Upper Extremity Assessment Upper Extremity Assessment: Defer to OT evaluation    Lower Extremity Assessment Lower Extremity Assessment: [name] deficits/detail;LLE deficits/detail [name] Deficits / Details: BKA; notable pain with knee ROM; able to get to full knee extension; noting some hamstring tightness LLE Deficits / Details: L  AKA; notable hip flexor tightness       Communication   Communication: No difficulties  Cognition Arousal/Alertness: Awake/alert Behavior During Therapy: WFL for tasks assessed/performed Overall Cognitive Status: Within Functional Limits for tasks assessed                                 General Comments: at tiems tangential      General Comments      Exercises Other Exercises Other Exercises: Bolstered bridging x10 with  bilateral femurs suported   Assessment/Plan    PT Assessment Patient needs continued PT services  PT Problem List Decreased strength;Decreased range of motion;Decreased activity tolerance;Decreased balance;Decreased mobility;Decreased coordination;Decreased knowledge of use of DME;Decreased safety awareness;Impaired sensation;Decreased skin integrity;Pain       PT Treatment Interventions DME instruction;Functional mobility training;Therapeutic activities;Therapeutic exercise;Balance training;Neuromuscular re-education;Cognitive remediation;Patient/family education;Wheelchair mobility training    PT Goals (Current goals can be found in the Care Plan section)  Acute Rehab PT Goals Patient Stated Goal: Order dinner PT Goal Formulation: With patient Time For Goal Achievement: 10/22/20 Potential to Achieve Goals: Good    Frequency Min 3X/week   Barriers to discharge        Co-evaluation               AM-PAC PT "6 Clicks" Mobility  Outcome Measure Help needed turning from your back to your side while in a flat bed without using bedrails?: None Help needed moving from lying on your back to sitting on the side of a flat bed without using bedrails?: None Help needed moving to and from a bed to a chair (including a wheelchair)?: A Little Help needed standing up from a chair using your arms (e.g., wheelchair or bedside chair)?: Total Help needed to walk in hospital room?: Total Help needed climbing 3-5 steps with a railing? : Total 6 Click Score: 14    End of Session   Activity Tolerance: Patient tolerated treatment well Patient left: in bed   PT Visit Diagnosis: Other abnormalities of gait and mobility (R26.89)    Time: 1275-1700 PT Time Calculation (min) (ACUTE ONLY): 27 min   Charges:   PT Evaluation $PT Eval Moderate Complexity: 1 Mod PT Treatments $Therapeutic Activity: 8-22 mins        Roney Marion, PT  Acute Rehabilitation Services Pager [phone] Office  [place] 10/08/2020, 5:50 PM

## 2020-10-09 ENCOUNTER — Encounter (HOSPITAL_COMMUNITY): Payer: Self-pay | Admitting: Orthopaedic Surgery

## 2020-10-09 ENCOUNTER — Other Ambulatory Visit (HOSPITAL_COMMUNITY): Payer: Medicare Other

## 2020-10-09 DIAGNOSIS — I480 Paroxysmal atrial fibrillation: Secondary | ICD-10-CM | POA: Diagnosis not present

## 2020-10-09 DIAGNOSIS — I739 Peripheral vascular disease, unspecified: Secondary | ICD-10-CM | POA: Diagnosis not present

## 2020-10-09 DIAGNOSIS — D649 Anemia, unspecified: Secondary | ICD-10-CM

## 2020-10-09 DIAGNOSIS — M869 Osteomyelitis, unspecified: Secondary | ICD-10-CM | POA: Diagnosis not present

## 2020-10-09 DIAGNOSIS — N179 Acute kidney failure, unspecified: Secondary | ICD-10-CM

## 2020-10-09 DIAGNOSIS — E1152 Type 2 diabetes mellitus with diabetic peripheral angiopathy with gangrene: Secondary | ICD-10-CM | POA: Diagnosis not present

## 2020-10-09 LAB — URINALYSIS, COMPLETE (UACMP) WITH MICROSCOPIC
Bilirubin Urine: NEGATIVE
Glucose, UA: 50 mg/dL — AB
Ketones, ur: NEGATIVE mg/dL
Nitrite: NEGATIVE
Protein, ur: 100 mg/dL — AB
Specific Gravity, Urine: 1.011 (ref 1.005–1.030)
pH: 5 (ref 5.0–8.0)

## 2020-10-09 LAB — PROTEIN / CREATININE RATIO, URINE
Creatinine, Urine: 63.06 mg/dL
Protein Creatinine Ratio: 2.01 mg/mg{Cre} — ABNORMAL HIGH (ref 0.00–0.15)
Total Protein, Urine: 127 mg/dL

## 2020-10-09 LAB — RETICULOCYTES
Immature Retic Fract: 18.2 % — ABNORMAL HIGH (ref 2.3–15.9)
RBC.: 2.85 MIL/uL — ABNORMAL LOW (ref 3.87–5.11)
Retic Count, Absolute: 47.3 10*3/uL (ref 19.0–186.0)
Retic Ct Pct: 1.7 % (ref 0.4–3.1)

## 2020-10-09 LAB — COMPREHENSIVE METABOLIC PANEL
ALT: 6 U/L (ref 0–44)
AST: 13 U/L — ABNORMAL LOW (ref 15–41)
Albumin: 2.3 g/dL — ABNORMAL LOW (ref 3.5–5.0)
Alkaline Phosphatase: 80 U/L (ref 38–126)
Anion gap: 11 (ref 5–15)
BUN: 45 mg/dL — ABNORMAL HIGH (ref 6–20)
CO2: 18 mmol/L — ABNORMAL LOW (ref 22–32)
Calcium: 8 mg/dL — ABNORMAL LOW (ref 8.9–10.3)
Chloride: 107 mmol/L (ref 98–111)
Creatinine, Ser: 2.85 mg/dL — ABNORMAL HIGH (ref 0.44–1.00)
GFR, Estimated: 19 mL/min — ABNORMAL LOW (ref >60)
Glucose, Bld: 148 mg/dL — ABNORMAL HIGH (ref 70–99)
Potassium: 3.5 mmol/L (ref 3.5–5.1)
Sodium: 136 mmol/L (ref 135–145)
Total Bilirubin: 0.8 mg/dL (ref 0.3–1.2)
Total Protein: 6.3 g/dL — ABNORMAL LOW (ref 6.5–8.1)

## 2020-10-09 LAB — CBC WITH DIFFERENTIAL/PLATELET
Abs Immature Granulocytes: 0.1 10*3/uL — ABNORMAL HIGH (ref 0.00–0.07)
Basophils Absolute: 0 10*3/uL (ref 0.0–0.1)
Basophils Relative: 0 %
Eosinophils Absolute: 0.1 10*3/uL (ref 0.0–0.5)
Eosinophils Relative: 1 %
HCT: 26.7 % — ABNORMAL LOW (ref 36.0–46.0)
Hemoglobin: 7.8 g/dL — ABNORMAL LOW (ref 12.0–15.0)
Immature Granulocytes: 1 %
Lymphocytes Relative: 23 %
Lymphs Abs: 1.7 10*3/uL (ref 0.7–4.0)
MCH: 24.8 pg — ABNORMAL LOW (ref 26.0–34.0)
MCHC: 29.2 g/dL — ABNORMAL LOW (ref 30.0–36.0)
MCV: 85 fL (ref 80.0–100.0)
Monocytes Absolute: 0.6 10*3/uL (ref 0.1–1.0)
Monocytes Relative: 8 %
Neutro Abs: 5 10*3/uL (ref 1.7–7.7)
Neutrophils Relative %: 67 %
Platelets: 274 10*3/uL (ref 150–400)
RBC: 3.14 MIL/uL — ABNORMAL LOW (ref 3.87–5.11)
RDW: 16.1 % — ABNORMAL HIGH (ref 11.5–15.5)
WBC: 7.6 10*3/uL (ref 4.0–10.5)
nRBC: 0 % (ref 0.0–0.2)

## 2020-10-09 LAB — PREPARE RBC (CROSSMATCH)

## 2020-10-09 LAB — GLUCOSE, CAPILLARY
Glucose-Capillary: 125 mg/dL — ABNORMAL HIGH (ref 70–99)
Glucose-Capillary: 98 mg/dL (ref 70–99)
Glucose-Capillary: 139 mg/dL — ABNORMAL HIGH (ref 70–99)
Glucose-Capillary: 103 mg/dL — ABNORMAL HIGH (ref 70–99)

## 2020-10-09 LAB — IRON AND TIBC
Iron: 26 ug/dL — ABNORMAL LOW (ref 28–170)
Saturation Ratios: 11 % (ref 10.4–31.8)
TIBC: 241 ug/dL — ABNORMAL LOW (ref 250–450)
UIBC: 215 ug/dL

## 2020-10-09 LAB — FERRITIN: Ferritin: 126 ng/mL (ref 11–307)

## 2020-10-09 MED ORDER — SODIUM CHLORIDE 0.9 % IV SOLN: INTRAVENOUS | Status: AC

## 2020-10-09 MED ORDER — SODIUM BICARBONATE 650 MG PO TABS
650.0000 mg | ORAL_TABLET | Freq: Two times a day (BID) | ORAL | Status: DC
  Administered 2020-10-09 – 2020-10-11 (×5): 650 mg via ORAL
  Filled 2020-10-09 (×5): qty 1

## 2020-10-09 MED ORDER — PREGABALIN 75 MG PO CAPS
150.0000 mg | ORAL_CAPSULE | Freq: Two times a day (BID) | ORAL | Status: DC
  Administered 2020-10-09 – 2020-10-10 (×2): 150 mg via ORAL
  Filled 2020-10-09 (×2): qty 2

## 2020-10-09 MED ORDER — VANCOMYCIN HCL 500 MG/100ML IV SOLN: 500.0000 mg | INTRAVENOUS | Status: DC

## 2020-10-09 MED ORDER — SODIUM CHLORIDE 0.9% IV SOLUTION: Freq: Once | INTRAVENOUS | Status: AC

## 2020-10-09 MED ORDER — PREGABALIN 100 MG PO CAPS: 200.0000 mg | ORAL_CAPSULE | Freq: Every day | ORAL | Status: DC

## 2020-10-09 MED ORDER — SODIUM CHLORIDE 0.9 % IV SOLN
100.0000 mg | Freq: Two times a day (BID) | INTRAVENOUS | Status: DC
  Administered 2020-10-09 – 2020-10-11 (×4): 100 mg via INTRAVENOUS
  Filled 2020-10-09 (×5): qty 100

## 2020-10-09 NOTE — Progress Notes (Addendum)
PROGRESS NOTE    [name]   YKD:[id number]  DOB: 25-Jun-1966  DOA: 10/06/2020 PCP: Frazier Richards, MD   Brief Narrative:  [name] is a 54 year old female who lives with her spouse and uses a wheelchair and has the following medical problems: type II DM with peripheral neuropathy, GERD, hyperlipidemia, paroxysmal atrial fibrillation on Xarelto, PAD, s/p left AKA, ischemic cardiomyopathy, ongoing tobacco use disorder, bilateral carotid artery stenosis, presented to ED due to worsening chronic right foot ulcer with associated foul smell.  Admitted for right first toe/right medial foot wet gangrene, osteomyelitis and cellulitis complicating chronic wound, DM with peripheral neuropathy and PAD.  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   Subjective: No complaints today other than not liking the food.    Assessment & Plan:   Principal Problem: Right foot osteomyelitis with wet gangrene and cellulitis in setting of diabetic peripheral neuropathy and PAD - The patient underwent a BKA on 11/20 and appears to be recuperating well. 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 If secondary to Vanc- will ask for renal consult -Of note, last creatinine in March 2020 was 1.60    Peripheral vascular disease, unspecified  Left AKA -Has undergone many lower extremity surgeries including bypass surgery in the past -Continue Crestor    DM2 (diabetes mellitus, type 2) with peripheral neuropathy and retinopathy -Patient is on  Lantus and NovoLog at home according to the med rec -In the hospital she is on a sliding scale which is being continued -Blood sugar in the 1-200 range- Lantus 10 U started 11/23 and improvement noted- no adjustments today -Continue Lyrica    Paroxysmal atrial fibrillation - in NSR at this time -Continue metoprolol with holding parameters to prevent RVR - resumed Xarelto 11/21 per ortho (she takes it in evenings)-I discussed this with Dr. Lucia Gaskins  Hypotension -BP in the 90s-follow-holding parameters on metoprolol  Anemia, normocytic  - follow- Hb 8.5 when admitted - Hb down to 7.8 but may be partially dilutional  Time spent in minutes: 35 DVT prophylaxis: Xarelto Code Status: Full code Family Communication:  Disposition Plan:  Status is: Inpatient  Remains inpatient appropriate because:managing BKA and infection.   Dispo:  Patient From: Home  Planned Disposition: Home with Health Care Svc vs CIR  Expected discharge date: 10/10/20  Medically stable for discharge: No Consultants:   Ortho Procedures:   Left BKA Antimicrobials:  Anti-infectives (From admission, onward)   Start     Dose/Rate Route Frequency Ordered Stop   10/10/20 2200  vancomycin (VANCOREADY) IVPB 500 mg/100 mL        500 mg 100 mL/hr over 60 Minutes Intravenous Every 24 hours 10/09/20 1307     10/08/20 2300  vancomycin (VANCOREADY) IVPB 1250 mg/250 mL  Status:  Discontinued        1,250 mg 166.7 mL/hr over 90 Minutes Intravenous Every 48 hours 10/06/20 2221 10/09/20 1307   10/08/20 0600  ceFAZolin (ANCEF)  IVPB 2g/100 mL premix  Status:  Discontinued        2 g 200 mL/hr over 30 Minutes Intravenous On call to O.R. 10/07/20 1423 10/07/20 1430   10/07/20 2130  ceFEPIme (MAXIPIME) 2 g in sodium chloride 0.9 % 100 mL IVPB  Status:  Discontinued        2 g 200 mL/hr over 30 Minutes Intravenous Every 24 hours 10/06/20 2221 10/09/20 1303   10/06/20 2230  metroNIDAZOLE (FLAGYL) tablet 500 mg  Status:   Discontinued        500 mg Oral Every 8 hours 10/06/20 2224 10/09/20 1303   10/06/20 2215  metroNIDAZOLE (FLAGYL) IVPB 500 mg  Status:  Discontinued        500 mg 100 mL/hr over 60 Minutes Intravenous Every 8 hours 10/06/20 2201 10/06/20 2224   10/06/20 2015  vancomycin (VANCOCIN) IVPB 1000 mg/200 mL premix  Status:  Discontinued        1,000 mg 200 mL/hr over 60 Minutes Intravenous  Once 10/06/20 2001 10/06/20 2011   10/06/20 2015  ceFEPIme (MAXIPIME) 1 g in sodium chloride 0.9 % 100 mL IVPB  Status:  Discontinued        1 g 200 mL/hr over 30 Minutes Intravenous  Once 10/06/20 2001 10/06/20 2011   10/06/20 2015  ceFEPIme (MAXIPIME) 2 g in sodium chloride 0.9 % 100 mL IVPB        2 g 200 mL/hr over 30 Minutes Intravenous  Once 10/06/20 2011 10/06/20 2234   10/06/20 2015  vancomycin (VANCOREADY) IVPB 1500 mg/300 mL        1,500 mg 150 mL/hr over 120 Minutes Intravenous  Once 10/06/20 2011 10/07/20 0146       Objective: Vitals:   10/08/20 1205 10/08/20 2001 10/09/20 0233 10/09/20 0947  BP: 101/72 (!) 96/58 98/62 113/62  Pulse: 78 70 80 76  Resp:  18 18 20   Temp: 98.6 F (37 C) 97.8 F (36.6 C) 98.6 F (37 C) 98.2 F (36.8 C)  TempSrc: Oral Axillary Axillary Oral  SpO2: 98% 98% 98% 98%  Weight:      Height:        Intake/Output Summary (Last 24 hours) at 10/09/2020 1353 Last data filed at 10/09/2020 1100 Gross per 24 hour  Intake 1425 ml  Output 500 ml  Net 925 ml   Filed Weights   10/06/20 2200  Weight: 72.6 kg    Examination: General exam: Appears comfortable  HEENT: PERRLA, oral mucosa moist, no sclera icterus or thrush Respiratory system: Clear to auscultation. Respiratory effort normal. Cardiovascular system: S1 & S2 heard,  No murmurs  Gastrointestinal system: Abdomen soft, non-tender, nondistended. Normal bowel sounds   Central nervous system: Alert and oriented. No focal neurological deficits. Extremities: No cyanosis, clubbing- Right BKA with wound vac  in place Skin: No rashes or ulcers Psychiatry:  Mood & affect appropriate.     Data Reviewed: I have personally reviewed following labs and imaging studies  CBC: Recent Labs  Lab 10/06/20 1343 10/07/20 0153 10/08/20 0647 10/09/20 0906  WBC 8.4 6.3 6.2 7.6  NEUTROABS 7.1 4.5 4.8 5.0  HGB 8.5* 7.9* 8.5* 7.8*  HCT 29.1* 27.2* 28.4* 26.7*  MCV 85.1 85.5 86.3 85.0  PLT 285 253 262 672   Basic Metabolic Panel: Recent Labs  Lab 10/06/20 1343 10/07/20 0153 10/08/20 0647 10/09/20 0906  NA 132* 134* 137 136  K 3.7 3.6 3.8 3.5  CL 101 101 107 107  CO2 19* 21*  19* 18*  GLUCOSE 169* 132* 219* 148*  BUN 37* 36* 40* 45*  CREATININE 2.42* 2.34* 2.36* 2.85*  CALCIUM 8.0* 7.8* 7.9* 8.0*   GFR: Estimated Creatinine Clearance: 21.4 mL/min (A) (by C-G formula based on SCr of 2.85 mg/dL (H)). Liver Function Tests: Recent Labs  Lab 10/06/20 1343 10/07/20 0153 10/08/20 0647 10/09/20 0906  AST 9* 10* 13* 13*  ALT 8 8 7 6   ALKPHOS 95 85 81 80  BILITOT 0.5 0.5 0.5 0.8  PROT 6.7 6.2* 6.0* 6.3*  ALBUMIN 2.4* 2.2* 2.2* 2.3*   No results for input(s): LIPASE, AMYLASE in the last 168 hours. No results for input(s): AMMONIA in the last 168 hours. Coagulation Profile: No results for input(s): INR, PROTIME in the last 168 hours. Cardiac Enzymes: No results for input(s): CKTOTAL, CKMB, CKMBINDEX, TROPONINI in the last 168 hours. BNP (last 3 results) No results for input(s): PROBNP in the last 8760 hours. HbA1C: No results for input(s): HGBA1C in the last 72 hours. CBG: Recent Labs  Lab 10/08/20 1123 10/08/20 1645 10/08/20 1958 10/09/20 0627 10/09/20 1134  GLUCAP 195* 137* 130* 125* 98   Lipid Profile: No results for input(s): CHOL, HDL, LDLCALC, TRIG, CHOLHDL, LDLDIRECT in the last 72 hours. Thyroid Function Tests: No results for input(s): TSH, T4TOTAL, FREET4, T3FREE, THYROIDAB in the last 72 hours. Anemia Panel: No results for input(s): VITAMINB12, FOLATE, FERRITIN, TIBC,  IRON, RETICCTPCT in the last 72 hours. Urine analysis:    Component Value Date/Time   COLORURINE YELLOW 04/25/2018 0407   APPEARANCEUR CLOUDY (A) 04/25/2018 0407   LABSPEC 1.012 04/25/2018 0407   PHURINE 5.0 04/25/2018 0407   GLUCOSEU NEGATIVE 04/25/2018 0407   HGBUR MODERATE (A) 04/25/2018 0407   BILIRUBINUR NEGATIVE 04/25/2018 0407   KETONESUR NEGATIVE 04/25/2018 0407   PROTEINUR 100 (A) 04/25/2018 0407   UROBILINOGEN 0.2 06/25/2011 1707   NITRITE NEGATIVE 04/25/2018 0407   LEUKOCYTESUR NEGATIVE 04/25/2018 0407   Sepsis Labs: @LABRCNTIP (procalcitonin:4,lacticidven:4) ) Recent Results (from the past 240 hour(s))  Blood culture (routine x 2)     Status: None (Preliminary result)   Collection Time: 10/06/20  8:08 PM   Specimen: BLOOD  Result Value Ref Range Status   Specimen Description BLOOD RIGHT ANTECUBITAL  Final   Special Requests   Final    BOTTLES DRAWN AEROBIC AND ANAEROBIC Blood Culture results may not be optimal due to an inadequate volume of blood received in culture bottles   Culture   Final    NO GROWTH 3 DAYS Performed at [facility], [place] [address] [place], [place] [address]    Report Status PENDING  Incomplete  Blood culture (routine x 2)     Status: None (Preliminary result)   Collection Time: 10/06/20  8:36 PM   Specimen: BLOOD LEFT FOREARM  Result Value Ref Range Status   Specimen Description BLOOD LEFT FOREARM  Final   Special Requests   Final    BOTTLES DRAWN AEROBIC AND ANAEROBIC Blood Culture results may not be optimal due to an inadequate volume of blood received in culture bottles   Culture   Final    NO GROWTH 3 DAYS Performed at [facility], [place] [address] [place], [place] [address]    Report Status PENDING  Incomplete  Respiratory Panel by RT PCR (Flu A&B, Covid) - Nasopharyngeal Swab     Status: None   Collection Time: 10/06/20  8:50 PM   Specimen: Nasopharyngeal Swab; Nasopharyngeal(NP) swabs in vial transport medium  Result  Value Ref Range Status   SARS Coronavirus 2 by RT PCR NEGATIVE NEGATIVE Final    Comment: (NOTE) SARS-CoV-2 target nucleic acids are NOT DETECTED.  The SARS-CoV-2 RNA is generally detectable in upper respiratoy specimens during the acute phase of infection. The lowest concentration of SARS-CoV-2 viral copies this assay can detect is 131 copies/mL. A negative result does not preclude SARS-Cov-2 infection and should not be used as the sole basis for treatment or other patient management decisions. A negative result may occur with  improper specimen collection/handling, submission of specimen other than nasopharyngeal swab, presence of viral mutation(s) within the areas targeted by this assay, and inadequate number of viral copies (<131 copies/mL). A negative result must be combined with clinical observations, patient history, and epidemiological information. The expected result is Negative.  Fact Sheet for Patients:  PinkCheek.be  Fact Sheet for Healthcare Providers:  GravelBags.it  This test is no t yet approved or cleared by the [place] FDA and  has been authorized for detection and/or diagnosis of SARS-CoV-2 by FDA under an Emergency Use Authorization (EUA). This EUA will remain  in effect (meaning this test can be used) for the duration of the COVID-19 declaration under Section 564(b)(1) of the Act, 21 U.S.C. section 360bbb-3(b)(1), unless the authorization is terminated or revoked sooner.     Influenza A by PCR NEGATIVE NEGATIVE Final   Influenza B by PCR NEGATIVE NEGATIVE Final    Comment: (NOTE) The Xpert Xpress SARS-CoV-2/FLU/RSV assay is intended as an aid in  the diagnosis of influenza from Nasopharyngeal swab specimens and  should not be used as a sole basis for treatment. Nasal washings and  aspirates are unacceptable for Xpert Xpress SARS-CoV-2/FLU/RSV  testing.  Fact Sheet for  Patients: PinkCheek.be  Fact Sheet for Healthcare Providers: GravelBags.it  This test is not yet approved or cleared by the [place] FDA and  has been authorized for detection and/or diagnosis of SARS-CoV-2 by  FDA under an Emergency Use Authorization (EUA). This EUA will remain  in effect (meaning this test can be used) for the duration of the  Covid-19 declaration under Section 564(b)(1) of the Act, 21  U.S.C. section 360bbb-3(b)(1), unless the authorization is  terminated or revoked. Performed at [facility], [place] [address] [place], [place] [address]          Radiology Studies: No results found.    Scheduled Meds: . amitriptyline  25 mg Oral QHS  . ferrous sulfate  325 mg Oral BID WC  . insulin aspart  0-5 Units Subcutaneous QHS  . insulin aspart  0-9 Units Subcutaneous TID WC  . insulin glargine  10 Units Subcutaneous QHS  . metoprolol tartrate  12.5 mg Oral BID  . nicotine  21 mg Transdermal Daily  . pantoprazole  40 mg Oral Daily  . pregabalin  50 mg Oral BID  . Rivaroxaban  15 mg Oral Q supper  . rosuvastatin  20 mg Oral q1800   Continuous Infusions: . [START ON 10/10/2020] vancomycin       LOS: 3 days      Debbe Odea, MD [name] Pager: [email or web address] 10/09/2020, 1:53 PM

## 2020-10-09 NOTE — Progress Notes (Signed)
Physical Therapy Treatment Patient Details Name: [name] MRN: [id number] DOB: [birth year] Today's Date: 10/09/2020    History of Present Illness 54 y.o. female With long-standing uncontrolled diabetes and vasculopathy presented to the emergency department with systemic symptoms consistent with sepsis and evidence of wet gangrene of her right foot medially and dorsally and plantarly. Now s/p BKA with VAC placement    PT Comments    Continuing work on functional mobility and activity tolerance;  Sesion focused on transfers OOB, and pt agreed to get to [facility] via Anterior-posterior transfer; incr time and multimodal cueing for direction and instruction; At times inefficent scoot, but successfully got to [facility] with mod assist; She remains commited to work towards her independence, and eventually getting both prostheses to walk again; Good progress, too -- Continue to rec CIR for intensive rehab to maximize independence and safety with mobility, and to get a good foundation to reach her ambulation goals   Follow Up Recommendations  CIR     Equipment Recommendations  Other (comment) (TBD; pretty well equipped)    Recommendations for Other Services       Precautions / Restrictions Precautions Precautions: Fall Restrictions LLE Weight Bearing: Non weight bearing    Mobility  Bed Mobility Overal bed mobility: Needs Assistance Bed Mobility: Supine to Sit;Sit to Supine     Supine to sit: Supervision Sit to supine: Min guard   General bed mobility comments: Able to scoot and turn body, then lay back to supine via bilateral elbow prop  Transfers Overall transfer level: Needs assistance   Transfers: Comptroller transfers: Min assist;Mod assist   General transfer comment: Significantly incr time, and directional and instructional cueing for technique; Mod assist for hip lift onto [facility] (which was facing the bed), and heavy mod assist to steady  [facility]  Ambulation/Gait                 Stairs             Wheelchair Mobility    Modified Rankin (Stroke Patients Only)       Balance     Sitting balance-Leahy Scale: Good                                      Cognition Arousal/Alertness: Awake/alert Behavior During Therapy: WFL for tasks assessed/performed Overall Cognitive Status: Within Functional Limits for tasks assessed                                 General Comments: at tiems tangential      Exercises      General Comments General comments (skin integrity, edema, etc.): Pt agrees to contacing her Prosthetist Fritz Pickerel at [employer]) to begin planning for R prosthesis      Pertinent Vitals/Pain Pain Assessment: Faces Faces Pain Scale: Hurts a little bit Pain Location: R residual limb Pain Descriptors / Indicators: Discomfort Pain Intervention(s): Monitored during session    Home Living                      Prior Function            PT Goals (current goals can now be found in the care plan section) Acute Rehab PT Goals Patient Stated Goal: Tells me she wants to walk again; wants  to get a R prosthesis  PT Goal Formulation: With patient Time For Goal Achievement: 10/22/20 Potential to Achieve Goals: Good Progress towards PT goals: Progressing toward goals    Frequency    Min 3X/week      PT Plan Current plan remains appropriate    Co-evaluation              AM-PAC PT "6 Clicks" Mobility   Outcome Measure  Help needed turning from your back to your side while in a flat bed without using bedrails?: None Help needed moving from lying on your back to sitting on the side of a flat bed without using bedrails?: None Help needed moving to and from a bed to a chair (including a wheelchair)?: A Little Help needed standing up from a chair using your arms (e.g., wheelchair or bedside chair)?: Total Help needed to walk in hospital room?: Total Help  needed climbing 3-5 steps with a railing? : Total 6 Click Score: 14    End of Session Equipment Utilized During Treatment: Other (comment) (bed pads) Activity Tolerance: Patient tolerated treatment well Patient left: in bed;with bed alarm set Nurse Communication: Mobility status PT Visit Diagnosis: Other abnormalities of gait and mobility (R26.89)     Time: 6773-7366 PT Time Calculation (min) (ACUTE ONLY): 47 min  Charges:  $Therapeutic Activity: 38-52 mins                     Roney Marion, PT  Acute Rehabilitation Services Pager [phone] Office [phone]    [place] 10/09/2020, 2:24 PM

## 2020-10-09 NOTE — Progress Notes (Signed)
Inpatient Rehab Admissions Coordinator Note:   Per therapy recommendations, pt was screened for CIR candidacy by Shann Medal, PT, DPT.  At this time note pt min guard to supervision for mobility, and requiring up to mod assist for LB ADLs.  Pt legally blind at baseline, so would likely have min guard goals on CIR.  Will follow for 1 more therapy session, as I anticipate patient will not need a CIR stay.  Please contact me with questions.   Shann Medal, PT, DPT [phone] 10/09/20 9:55 AM

## 2020-10-09 NOTE — Consult Note (Signed)
[facility] Renal Consultation Note  Requesting MD: Debbe Odea, MD Indication for Consultation:  AKI on CKD  Chief complaint: right foot ulcer   HPI:  [name] is a 54 y.o. female history of diabetes, cardiomyopathy, peripheral vascular disease, and paroxysmal atrial fibrillation who presented to the hospital with right foot ulcer with a foul smell.  Found to have right foot osteo and wet gangrene.  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  Her creatinine has risen since admission from 2.42 on admit to 2.85 today.  See trends below.  She does have a history of renal failure.  She has had 500 mL uop over 11/22 thus far as charted and had three unmeasured voids on 11/21. She's been getting scheduled vanc.  She feels ok today.   I discussed risks/benefits/indications for PRBC's with the patient and her husband at bedside and she consents to blood products.  Creatinine, Ser  Date/Time Value Ref Range Status  10/09/2020 09:06 AM 2.85 (H) 0.44 - 1.00 mg/dL Final  10/08/2020 06:47 AM 2.36 (H) 0.44 - 1.00 mg/dL Final  10/07/2020 01:53 AM 2.34 (H) 0.44 - 1.00 mg/dL Final  10/06/2020 01:43 PM 2.42 (H) 0.44 - 1.00 mg/dL Final  01/22/2019 03:40 PM 1.60 (H) 0.57 - 1.00 mg/dL Final  11/16/2018 01:39 PM 1.26 (H) 0.44 - 1.00 mg/dL Final  06/02/2018 06:03 AM 2.28 (H) 0.44 - 1.00 mg/dL Final  05/29/2018 04:52 AM 2.34 (H) 0.44 - 1.00 mg/dL Final  05/25/2018 05:32 AM 2.99 (H) 0.44 - 1.00 mg/dL Final  05/22/2018 02:57 PM 3.75 (H) 0.44 - 1.00 mg/dL Final  05/22/2018 05:36 AM 3.74 (H) 0.44 - 1.00 mg/dL Final  05/21/2018 06:25 AM 4.08 (H) 0.44 - 1.00 mg/dL Final  05/20/2018 06:57 AM 4.49 (H) 0.44 - 1.00 mg/dL Final  05/19/2018 05:19 AM 4.71 (H) 0.44 - 1.00 mg/dL Final  05/18/2018 05:52 AM 5.12 (H) 0.44 - 1.00 mg/dL Final  05/17/2018 07:12 AM 5.34 (H) 0.44 - 1.00 mg/dL Final  05/16/2018 05:00 AM 5.42 (H) 0.44 - 1.00 mg/dL Final  05/15/2018 07:29 AM 5.13 (H) 0.44 - 1.00 mg/dL Final  05/14/2018  06:28 AM 4.90 (H) 0.44 - 1.00 mg/dL Final  05/13/2018 06:36 AM 4.54 (H) 0.44 - 1.00 mg/dL Final  05/12/2018 05:31 AM 4.07 (H) 0.44 - 1.00 mg/dL Final  05/11/2018 06:26 AM 3.49 (H) 0.44 - 1.00 mg/dL Final  05/10/2018 03:14 PM 3.13 (H) 0.44 - 1.00 mg/dL Final  05/09/2018 07:43 AM 3.65 (H) 0.44 - 1.00 mg/dL Final  05/09/2018 06:41 AM 3.86 (H) 0.44 - 1.00 mg/dL Final  05/08/2018 04:18 AM 3.23 (H) 0.44 - 1.00 mg/dL Final  05/07/2018 04:18 AM 4.69 (H) 0.44 - 1.00 mg/dL Final  05/06/2018 03:45 AM 6.65 (H) 0.44 - 1.00 mg/dL Final  05/06/2018 03:45 AM 6.58 (H) 0.44 - 1.00 mg/dL Final  05/05/2018 05:24 AM 6.09 (H) 0.44 - 1.00 mg/dL Final  05/04/2018 04:45 AM 5.65 (H) 0.44 - 1.00 mg/dL Final  05/03/2018 04:27 AM 5.12 (H) 0.44 - 1.00 mg/dL Final  05/02/2018 11:14 PM 4.86 (H) 0.44 - 1.00 mg/dL Final  05/02/2018 06:04 AM 4.83 (H) 0.44 - 1.00 mg/dL Final  05/01/2018 05:00 AM 7.04 (H) 0.44 - 1.00 mg/dL Final  04/30/2018 05:00 AM 6.38 (H) 0.44 - 1.00 mg/dL Final  04/29/2018 03:20 AM 7.61 (H) 0.44 - 1.00 mg/dL Final  04/28/2018 05:00 AM 7.72 (H) 0.44 - 1.00 mg/dL Final  04/27/2018 04:35 AM 7.79 (H) 0.44 - 1.00 mg/dL Final  04/26/2018 08:00 PM 7.80 (H)  0.44 - 1.00 mg/dL Final  04/26/2018 04:04 AM 7.63 (H) 0.44 - 1.00 mg/dL Final  04/25/2018 05:41 PM 7.65 (H) 0.44 - 1.00 mg/dL Final  04/25/2018 06:51 AM 7.41 (H) 0.44 - 1.00 mg/dL Final  04/25/2018 03:18 AM 7.54 (H) 0.44 - 1.00 mg/dL Final  04/24/2018 11:45 PM 8.12 (H) 0.44 - 1.00 mg/dL Final  03/31/2018 06:40 AM 1.07 (H) 0.44 - 1.00 mg/dL Final  03/29/2018 03:58 AM 0.95 0.44 - 1.00 mg/dL Final  03/27/2018 11:51 PM 0.90 0.44 - 1.00 mg/dL Final  03/27/2018 07:45 PM 0.83 0.44 - 1.00 mg/dL Final  03/24/2018 02:40 PM 0.87 0.44 - 1.00 mg/dL Final  03/24/2018 09:39 AM 0.80 0.44 - 1.00 mg/dL Final  02/27/2018 06:19 PM 0.94 0.44 - 1.00 mg/dL Final     PMHx:   Past Medical History:  Diagnosis Date  . Abnormal stress test    a. 02/2017 MV: large region of  fixed perfusion defect in basal to mid inf, mid-dist inflat walls, EF 43%. No ischemia (EF 55-60% by f/u echo).  . Arthritis   . Asthma   . Carotid arterial disease ([place])    a. 09/2017 Carotid U/S: 40-49% bilat ICA stenosis.  . Chronic back pain   . Coronary artery calcification seen on CT scan    a. 11/2017 CT Abd/Pelvis: Multi vessel coronary vascular Ca2+.  . Depression   . Diabetes mellitus   . Diabetic neuropathy ([place])   . Difficult intubation    DIFFICULT AIRWAY/FYI  . Family history of adverse reaction to anesthesia    mother had difficlty waking   . Femoral-popliteal bypass graft occlusion, left ([place]) 12/02/2017  . GERD (gastroesophageal reflux disease)   . History of echocardiogram    a. 03/2017 Echo: EF 55-60%, mild LVH, nl RV fxn.  . Hyperlipidemia   . Ischemic cardiomyopathy    a. 04/2018 Echo: EF 30-35%, Gr2 DD, mild LVH. Mild MR. Mildly dil LA, mod dil RV w/ mod red RV fxn, mild TR, PASP 23mHg.  . NSTEMI (non-ST elevated myocardial infarction) ([place]    a. 05/2018 in setting of Afib, sepsis, and post-op L AKA. Peak trop 7.7. EF 30-35% by echo-->cath not performed 2/2 renal failure.  . Osteomyelitis of right fibula ([place] 03/05/2017  . PAD (peripheral artery disease) ([place]    a. S/p L fem-pop bypass; b. 11/2017 s/p Aortobifem bypass 2/2 graft occlusion; c. 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  . Paroxysmal atrial fibrillation with rapid ventricular response ([place] 12/02/2017   a. CHA2DS2VASc = 3-->Xarelto; b. 05/2018 Recurrent Afib-->amio.  .Marland KitchenUlcer    Foot    Past Surgical History:  Procedure Laterality Date  . ABDOMINAL AORTAGRAM  June 15, 2014  . ABDOMINAL AORTAGRAM N/A 06/15/2014   Procedure: ABDOMINAL AMaxcine Ham  Surgeon: VSerafina Mitchell MD;  [facility];  Service: Cardiovascular;  Laterality: N/A;  . ABDOMINAL AORTAGRAM N/A 11/22/2014   Procedure: ABDOMINAL AORTAGRAM;  Surgeon: VSerafina Mitchell MD;  [facility];   Service: Cardiovascular;  Laterality: N/A;  . ABDOMINAL AORTOGRAM W/LOWER EXTREMITY N/A 01/07/2017   Procedure: Abdominal Aortogram w/Lower Extremity;  Surgeon: VSerafina Mitchell MD;  [facility];  Service: Cardiovascular;  Laterality: N/A;  . ABDOMINAL AORTOGRAM W/LOWER EXTREMITY N/A 10/31/2017   Procedure: ABDOMINAL AORTOGRAM W/LOWER EXTREMITY;  Surgeon: FElam Dutch MD;  [facility];  Service: Cardiovascular;  Laterality: N/A;  . ABDOMINAL AORTOGRAM W/LOWER EXTREMITY N/A 03/24/2018  Procedure: ABDOMINAL AORTOGRAM W/LOWER EXTREMITY;  Surgeon: Serafina Mitchell, MD;  Location: [place] CV LAB;  Service: Cardiovascular;  Laterality: N/A;  . AMPUTATION Left 04/26/2018   Procedure: AMPUTATION ABOVE KNEE;  Surgeon: Elam Dutch, MD;  Location: [place];  Service: Vascular;  Laterality: Left;  . AORTA - BILATERAL FEMORAL ARTERY BYPASS GRAFT N/A 11/28/2017   Procedure: AORTA BIFEMORAL BYPASS USING HEMASHIELD GOLD GRAFT & REIMPLANT IMA;  Surgeon: Serafina Mitchell, MD;  [facility];  Service: Vascular;  Laterality: N/A;  . AORTIC ARCH ANGIOGRAPHY N/A 10/31/2017   Procedure: AORTIC ARCH ANGIOGRAPHY;  Surgeon: Elam Dutch, MD;  Location: [place] CV LAB;  Service: Cardiovascular;  Laterality: N/A;  . APPLICATION OF WOUND VAC  11/28/2017   Procedure: APPLICATION OF WOUND VAC;  Surgeon: Serafina Mitchell, MD;  [facility];  Service: Vascular;;  . APPLICATION OF WOUND VAC Left 03/27/2018   Procedure: APPLICATION OF WOUND VAC LEFT GROIN USING PREVENA PLUS;  Surgeon: Serafina Mitchell, MD;  Location: [place];  Service: Vascular;  Laterality: Left;  . ARTERIAL BYPASS SURGERY   07/05/2010   Right Common Femoral to below knee popliteal BPG  . BACK SURGERY     X's  2  . CARDIAC CATHETERIZATION    . CHOLECYSTECTOMY     Gall Bladder  . CYSTECTOMY Left    wrist  . EMBOLECTOMY Left 11/28/2017   Procedure: Left Lower Extremity Embolectomy, Left Tibial Peroneal Trunk  Endarterectomy with Patch Angioplasty; Vein Harvest Small Saphenous Graft Left Lower Leg;  Surgeon: [name], MD;  Location: [place];  Service: Vascular;  Laterality: Left;  . EYE SURGERY Left 02/23/2020   Dr. Zadie Rhine  . FEMORAL-POPLITEAL BYPASS GRAFT Left 03/27/2018   Procedure: THROMBECTOMY OF LEFT FEMORAL TIBIAL BYPASS;  Surgeon: Serafina Mitchell, MD;  Location: [place];  Service: Vascular;  Laterality: Left;  . FEMORAL-TIBIAL BYPASS GRAFT Left 03/27/2018   Procedure: BYPASS GRAFT FEMORAL-TIBIAL ARTERY LEFT REDO USING CRYOPRESERVED SAPHENOUS VEIN 70cm;  Surgeon: Serafina Mitchell, MD;  Location[facility];  Service: Vascular;  Laterality: Left;  . INTERCOSTAL NERVE BLOCK  November 2015  . INTRAOPERATIVE ARTERIOGRAM  11/28/2017   Procedure: INTRA OPERATIVE ARTERIOGRAM OF LEFT LEG;  Surgeon: Serafina Mitchell, MD;  Location: [place];  Service: Vascular;;  . INTRAOPERATIVE ARTERIOGRAM Left 03/27/2018   Procedure: INTRA OPERATIVE ARTERIOGRAM TIMES TWO;  Surgeon: Serafina Mitchell, MD;  Location: [place];  Service: Vascular;  Laterality: Left;  . IR FLUORO GUIDE CV LINE RIGHT  03/20/2017  . IR FLUORO GUIDE CV LINE RIGHT  05/05/2018  . IR REMOVAL TUN CV CATH W/O FL  05/20/2018  . IR US GUIDE VASC ACCESS RIGHT  03/20/2017  . IR US GUIDE VASC ACCESS RIGHT  05/05/2018  . IRRIGATION AND DEBRIDEMENT BUTTOCKS Right 09/30/2016   Procedure: DEBRIDEMENT RIGHT  BUTTOCK WOUND;  Surgeon: Georganna Skeans, MD;  Location: [place];  Service: General;  Laterality: Right;  . left foot surgery    . PERIPHERAL VASCULAR CATHETERIZATION N/A 05/07/2016   Procedure: Abdominal Aortogram;  Surgeon: Serafina Mitchell, MD;  Location: [place] CV LAB;  Service: Cardiovascular;  Laterality: N/A;  . PERIPHERAL VASCULAR CATHETERIZATION N/A 05/07/2016   Procedure: Lower Extremity Angiography;  Surgeon: Serafina Mitchell, MD;  Location: [place] CV LAB;  Service: Cardiovascular;  Laterality: N/A;  . PERIPHERAL VASCULAR CATHETERIZATION N/A  05/07/2016   Procedure: Aortic Arch Angiography;  Surgeon: Serafina Mitchell, MD;  Location: [facility]  INVASIVE CV LAB;  Service: Cardiovascular;  Laterality: N/A;  . PERIPHERAL VASCULAR CATHETERIZATION N/A 05/07/2016   Procedure: Upper Extremity Angiography;  Surgeon: Serafina Mitchell, MD;  Location: [place] CV LAB;  Service: Cardiovascular;  Laterality: N/A;  . PERIPHERAL VASCULAR CATHETERIZATION Right 05/07/2016   Procedure: Peripheral Vascular Balloon Angioplasty;  Surgeon: Serafina Mitchell, MD;  Location: [place] CV LAB;  Service: Cardiovascular;  Laterality: Right;  subclavian  . PERIPHERAL VASCULAR CATHETERIZATION Right 05/07/2016   Procedure: Peripheral Vascular Intervention;  Surgeon: Serafina Mitchell, MD;  Location: [place] CV LAB;  Service: Cardiovascular;  Laterality: Right;  External  Iliac  . SKIN GRAFT Right 2012   [name] by Dr. Nils Pyle- Right and Left Ankle  . SPINE SURGERY    . THROMBECTOMY FEMORAL ARTERY  11/28/2017   Procedure: THROMBECTOMY  & REVISION OF BILATERAL FEMORAL TO POPLETEAL ARTERIES;  Surgeon: Serafina Mitchell, MD;  Location: [place];  Service: Vascular;;  . TONSILLECTOMY    . TRANSMETATARSAL AMPUTATION Right 10/07/2020   Procedure: RIGHT BELOW KNEE AMPUTATION;  Surgeon: Erle Crocker, MD;  Location: [place];  Service: Orthopedics;  Laterality: Right;    Family Hx:  Family History  Problem Relation Age of Onset  . Coronary artery disease Mother   . Peripheral vascular disease Mother   . Heart disease Mother        Before age 20  . Other Mother        Venous insuffiency  . Diabetes Mother   . Hyperlipidemia Mother   . Hypertension Mother   . Varicose Veins Mother   . Heart attack Mother        before age 30  . Heart disease Father   . Diabetes Father   . Diabetes Maternal Grandmother   . Diabetes Paternal Grandmother   . Diabetes Paternal Grandfather   . Diabetes Sister   . Hypertension Sister   . Diabetes Brother   . Hypertension Brother      Social History:  reports that she has been smoking cigarettes. She has a 30.00 pack-year smoking history. She has never used smokeless tobacco. She reports that she does not drink alcohol and does not use drugs.  Allergies:  Allergies  Allergen Reactions  . Lactose Intolerance (Gi) Diarrhea    Medications: Prior to Admission medications   Medication Sig Start Date End Date Taking? Authorizing Provider  albuterol (PROVENTIL) 2 MG tablet Take 2 mg by mouth daily as needed for shortness of breath.    Yes [provider]  dicyclomine (BENTYL) 10 MG capsule Take 10 mg by mouth 4 (four) times daily as needed for spasms.   Yes [provider]  HYDROcodone-acetaminophen (NORCO) 10-325 MG tablet Take 1 tablet by mouth every 2 (two) hours as needed (pain).  07/14/18  Yes [provider]  insulin glargine (LANTUS) 100 UNIT/ML injection Inject 0.18 mLs (18 Units total) into the skin 2 (two) times daily. Patient taking differently: Inject 30 Units into the skin 2 (two) times daily.  06/03/18  Yes Love, Ivan Anchors, PA-C  insulin lispro (HUMALOG) 100 UNIT/ML injection For glucose 121-150 use TWO units,  for 151-200 use FOUR units, for 201-250 use SIX units, for 251-300 use EIGHT units, for 301-350 use TEN units. For blood sugars greater than 350 call MD. Patient taking differently: Inject 10 Units into the skin 3 (three) times daily with meals. For glucose 121-150 use TWO units,  for 151-200 use FOUR units, for 201-250 use SIX  units, for 251-300 use EIGHT units, for 301-350 use TEN units. For blood sugars greater than 350 call MD. 06/03/18  Yes Love, Ivan Anchors, PA-C  naloxone [employer]) 4 MG/0.1ML LIQD nasal spray kit Place 1 spray into the nose once as needed (opioid overdose).   Yes [provider]  omeprazole (PRILOSEC) 40 MG capsule Take 40 mg by mouth daily.    Yes [provider]  omeprazole (PRILOSEC) 40 MG capsule Take 40 mg by mouth daily. 05/15/20  Yes  [provider]  pregabalin (LYRICA) 50 MG capsule Take 1 capsule (50 mg total) by mouth 2 (two) times daily. Patient taking differently: Take 150-200 mg by mouth See admin instructions. Take 3 capsules (150 mg) by mouth every morning and night, take 4 capsules (200 mg) every afternoon 06/03/18  Yes Love, Ivan Anchors, PA-C  rivaroxaban (XARELTO) 20 MG TABS tablet Take 20 mg by mouth daily with supper.   Yes [provider]  metoprolol tartrate (LOPRESSOR) 25 MG tablet Take 0.5 tablets (12.5 mg total) by mouth 2 (two) times daily. Patient not taking: Reported on 10/06/2020 07/21/18   Theora Gianotti, NP  rosuvastatin (CRESTOR) 20 MG tablet Take 1 tablet (20 mg total) by mouth daily at 6 PM. Patient not taking: Reported on 10/06/2020 07/23/19   Theora Gianotti, NP    I have reviewed the patient's current medications.  Labs:  BMP Latest Ref Rng & Units 10/09/2020 10/08/2020 10/07/2020  Glucose 70 - 99 mg/dL 148(H) 219(H) 132(H)  BUN 6 - 20 mg/dL 45(H) 40(H) 36(H)  Creatinine 0.44 - 1.00 mg/dL 2.85(H) 2.36(H) 2.34(H)  BUN/Creat Ratio 9 - 23 - - -  Sodium 135 - 145 mmol/L 136 137 134(L)  Potassium 3.5 - 5.1 mmol/L 3.5 3.8 3.6  Chloride 98 - 111 mmol/L 107 107 101  CO2 22 - 32 mmol/L 18(L) 19(L) 21(L)  Calcium 8.9 - 10.3 mg/dL 8.0(L) 7.9(L) 7.8(L)    Urinalysis    Component Value Date/Time   COLORURINE YELLOW 04/25/2018 0407   APPEARANCEUR CLOUDY (A) 04/25/2018 0407   LABSPEC 1.012 04/25/2018 0407   PHURINE 5.0 04/25/2018 0407   GLUCOSEU NEGATIVE 04/25/2018 0407   HGBUR MODERATE (A) 04/25/2018 0407   BILIRUBINUR NEGATIVE 04/25/2018 0407   KETONESUR NEGATIVE 04/25/2018 0407   PROTEINUR 100 (A) 04/25/2018 0407   UROBILINOGEN 0.2 06/25/2011 1707   NITRITE NEGATIVE 04/25/2018 0407   LEUKOCYTESUR NEGATIVE 04/25/2018 0407     ROS:  Pertinent items noted in HPI and remainder of comprehensive ROS otherwise negative. nausea earlier today   Physical  Exam: Vitals:   10/09/20 0233 10/09/20 0947  BP: 98/62 113/62  Pulse: 80 76  Resp: 18 20  Temp: 98.6 F (37 C) 98.2 F (36.8 C)  SpO2: 98% 98%     General: adult female in bed in NAD at rest HEENT: NCAT Eyes: EOMI sclera anicteric Neck: supple trachea midline Heart: S1S2 no rub Lungs: clear to auscultation; unlabored on room air Abdomen: soft/NT/ND obese habits Extremities: left AKA and right BKA with vac Skin: no rash on extremities exposed Neuro: alert and oriented x 3 provides hx and follows commands  Psych normal mood and affect  Assessment/Plan:  # AKI  - Felt most recently with pre-renal and ischemic insults with infection and hypotension, operation.  She has had previous AKI seen by [facility] inpatient and has never been seen in outpatient clinic at [facility].  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 - Continue supportive care -  transfuse as below to try to optimize - NS at 75/hr x 12 hours - check bladder scan post void and in/out cath if over 300 mL urine retained  - obtain UA, up/cr ratio - update renal ultrasound - Strict ins/outs ordered - Note that she is on scheduled vanc  - Would discontinue scheduled vanc and dose by level given her CKD - would check trough in AM on 11/23 to guide if dose needed    # CKD stage 3 - Sporadic data with 01/22/2019 Cr 1.60  # Right foot gangrene - S/p right BKA on 11/20  - per ortho    # Hypotension  - improved   # Anemia normocytic  - Would transfuse PRBC's - patient and husband consented 16/10  # Metabolic acidosis  - would supplement bicarbonate - initiated 11/22  # Afib  - Rate control per primary team - spoke with attending; there are hold parameters for metoprolol   Claudia Desanctis 10/09/2020, 3:11 PM

## 2020-10-09 NOTE — Progress Notes (Addendum)
Pharmacy Antibiotic Note  [name] is a 54 y.o. female admitted on 10/06/2020 with [name]  Osteomyelitis now s/p BKA. Pharmacy has been consulted for Vancomycin dosing. Patient is also on metronidazole and cefepime.  Patient is now s/p BKA and continuing antibiotics for cellulitis at amputation site. Suggest narrowing antibiotics, ortho agreed.  Plan:  Decrease Vancomycin to 500mg  Q 24hr, Goal trough 10-15  Ok to stop Cefepime and metronidazole per MD  Monitor cultures, clinical status, renal fx, vanc level on 11/25 F/u LOT   Height: 5\' 7"  (170.2 cm) Weight: 72.6 kg (160 lb) IBW/kg (Calculated) : 61.6  Temp (24hrs), Avg:98.2 F (36.8 C), Min:97.8 F (36.6 C), Max:98.6 F (37 C)  Recent Labs  Lab 10/06/20 1343 10/06/20 2056 10/07/20 0153 10/08/20 0647 10/09/20 0906  WBC 8.4  --  6.3 6.2 7.6  CREATININE 2.42*  --  2.34* 2.36* 2.85*  LATICACIDVEN  --  0.9  --   --   --     Estimated Creatinine Clearance: 21.4 mL/min (A) (by C-G formula based on SCr of 2.85 mg/dL (H)).    Allergies  Allergen Reactions  . Lactose Intolerance (Gi) Diarrhea    Antimicrobials this admission: 11/19 Cefepime >> 11/22 11/19 Vancomycin >>  11/20 MTZ >>11/22  Microbiology results: 11/19 Bcx NGTD     Benetta Spar, PharmD, BCPS, [facility] Clinical Pharmacist  Please check AMION for all [place] phone numbers After 10:00 PM, call [place] [phone]

## 2020-10-10 ENCOUNTER — Inpatient Hospital Stay (HOSPITAL_COMMUNITY): Payer: Medicare Other

## 2020-10-10 ENCOUNTER — Encounter (HOSPITAL_COMMUNITY): Payer: Self-pay | Admitting: Orthopaedic Surgery

## 2020-10-10 DIAGNOSIS — L03115 Cellulitis of right lower limb: Secondary | ICD-10-CM

## 2020-10-10 DIAGNOSIS — I739 Peripheral vascular disease, unspecified: Secondary | ICD-10-CM | POA: Diagnosis not present

## 2020-10-10 DIAGNOSIS — E1152 Type 2 diabetes mellitus with diabetic peripheral angiopathy with gangrene: Secondary | ICD-10-CM | POA: Diagnosis not present

## 2020-10-10 DIAGNOSIS — M869 Osteomyelitis, unspecified: Secondary | ICD-10-CM | POA: Diagnosis not present

## 2020-10-10 DIAGNOSIS — D519 Vitamin B12 deficiency anemia, unspecified: Secondary | ICD-10-CM

## 2020-10-10 DIAGNOSIS — I480 Paroxysmal atrial fibrillation: Secondary | ICD-10-CM | POA: Diagnosis not present

## 2020-10-10 LAB — BPAM RBC
Blood Product Expiration Date: 202111292359
ISSUE DATE / TIME: 202111222106
Unit Type and Rh: 9500

## 2020-10-10 LAB — SURGICAL PATHOLOGY

## 2020-10-10 LAB — TYPE AND SCREEN
ABO/RH(D): O NEG
Antibody Screen: NEGATIVE
Unit division: 0

## 2020-10-10 LAB — BASIC METABOLIC PANEL
Anion gap: 12 (ref 5–15)
BUN: 45 mg/dL — ABNORMAL HIGH (ref 6–20)
CO2: 18 mmol/L — ABNORMAL LOW (ref 22–32)
Calcium: 7.8 mg/dL — ABNORMAL LOW (ref 8.9–10.3)
Chloride: 108 mmol/L (ref 98–111)
Creatinine, Ser: 2.73 mg/dL — ABNORMAL HIGH (ref 0.44–1.00)
GFR, Estimated: 20 mL/min — ABNORMAL LOW (ref >60)
Glucose, Bld: 155 mg/dL — ABNORMAL HIGH (ref 70–99)
Potassium: 3.4 mmol/L — ABNORMAL LOW (ref 3.5–5.1)
Sodium: 138 mmol/L (ref 135–145)

## 2020-10-10 LAB — CBC
HCT: 27.5 % — ABNORMAL LOW (ref 36.0–46.0)
Hemoglobin: 8.3 g/dL — ABNORMAL LOW (ref 12.0–15.0)
MCH: 25.5 pg — ABNORMAL LOW (ref 26.0–34.0)
MCHC: 30.2 g/dL (ref 30.0–36.0)
MCV: 84.4 fL (ref 80.0–100.0)
Platelets: 252 10*3/uL (ref 150–400)
RBC: 3.26 MIL/uL — ABNORMAL LOW (ref 3.87–5.11)
RDW: 15.6 % — ABNORMAL HIGH (ref 11.5–15.5)
WBC: 6.7 10*3/uL (ref 4.0–10.5)
nRBC: 0 % (ref 0.0–0.2)

## 2020-10-10 LAB — GLUCOSE, CAPILLARY
Glucose-Capillary: 130 mg/dL — ABNORMAL HIGH (ref 70–99)
Glucose-Capillary: 101 mg/dL — ABNORMAL HIGH (ref 70–99)
Glucose-Capillary: 118 mg/dL — ABNORMAL HIGH (ref 70–99)
Glucose-Capillary: 115 mg/dL — ABNORMAL HIGH (ref 70–99)

## 2020-10-10 LAB — VITAMIN B12: Vitamin B-12: 180 pg/mL (ref 180–914)

## 2020-10-10 LAB — FOLATE: Folate: 7.1 ng/mL (ref >5.9)

## 2020-10-10 LAB — VANCOMYCIN, RANDOM: Vancomycin Rm: 22

## 2020-10-10 MED ORDER — LACTATED RINGERS IV SOLN: INTRAVENOUS | Status: AC

## 2020-10-10 MED ORDER — POTASSIUM CHLORIDE CRYS ER 20 MEQ PO TBCR: 40.0000 meq | EXTENDED_RELEASE_TABLET | Freq: Once | ORAL | Status: DC

## 2020-10-10 MED ORDER — LACTATED RINGERS IV SOLN: INTRAVENOUS | Status: DC

## 2020-10-10 MED ORDER — VITAMIN B-12 1000 MCG PO TABS
1000.0000 ug | ORAL_TABLET | Freq: Every day | ORAL | Status: DC
  Administered 2020-10-11: 1000 ug via ORAL
  Filled 2020-10-10: qty 1

## 2020-10-10 MED ORDER — PREGABALIN 75 MG PO CAPS
75.0000 mg | ORAL_CAPSULE | Freq: Two times a day (BID) | ORAL | Status: DC
  Administered 2020-10-10 – 2020-10-11 (×2): 75 mg via ORAL
  Filled 2020-10-10 (×2): qty 1

## 2020-10-10 MED ORDER — POTASSIUM CHLORIDE CRYS ER 20 MEQ PO TBCR
20.0000 meq | EXTENDED_RELEASE_TABLET | Freq: Once | ORAL | Status: AC
  Administered 2020-10-10: 20 meq via ORAL
  Filled 2020-10-10: qty 1

## 2020-10-10 MED ORDER — CYANOCOBALAMIN 1000 MCG/ML IJ SOLN
1000.0000 ug | Freq: Once | INTRAMUSCULAR | Status: AC
  Administered 2020-10-10: 1000 ug via SUBCUTANEOUS
  Filled 2020-10-10: qty 1

## 2020-10-10 NOTE — Anesthesia Postprocedure Evaluation (Signed)
Anesthesia Post Note  Patient: [name]  Procedure(s) Performed: RIGHT BELOW KNEE AMPUTATION (Right Leg Lower)     Patient location during evaluation: PACU Anesthesia Type: Regional and General Level of consciousness: awake and alert Pain management: pain level controlled Vital Signs Assessment: post-procedure vital signs reviewed and stable Respiratory status: spontaneous breathing, nonlabored ventilation, respiratory function stable and patient connected to nasal cannula oxygen Cardiovascular status: blood pressure returned to baseline and stable Postop Assessment: no apparent nausea or vomiting Anesthetic complications: no   No complications documented.  Last Vitals:  Vitals:   10/10/20 0756 10/10/20 1348  BP: (!) 108/57 115/61  Pulse: 74 68  Resp: 18 18  Temp: 36.9 C 36.6 C  SpO2: 93% 100%    Last Pain:  Vitals:   10/10/20 1904  TempSrc:   PainSc: 7                  [name] S

## 2020-10-10 NOTE — Progress Notes (Signed)
     [name] is a 54 y.o. female   Orthopaedic diagnosis: POD #3 status post right below the knee amputation  Subjective: She is doing well.  Minimal discomfort.  She has not mobilized much with physical therapy.  Denies fevers or chills.  Objectyive: Vitals:   10/10/20 0400 10/10/20 0756  BP: (!) 99/56 (!) 108/57  Pulse: 85 74  Resp: 18 18  Temp: 98.6 F (37 C) 98.5 F (36.9 C)  SpO2: 95% 93%     Exam: Awake and alert Respirations even and unlabored No acute distress  Right leg with the VAC removed demonstrates well-healing surgical incision.  Cellulitic changes improving overall.  No bleeding from the wound.  Assessment: POD #3 status post right below the knee amputation, doing well   Plan: Overall she seems to be improving nicely.  Her wound is clean and dry.  We will discontinue the [facility] for now.  A soft dressing was placed.  Please leave dressing in place and reinforce if needed. We discussed working on knee range of motion exercises to avoid knee flexion contracture.  She will work with physical therapy for mobilization.  She was evaluated by inpatient rehab and they did not feel that she is suitable for an inpatient rehab stay. I discussed with Dr. Wynelle Cleveland about starting her Eliquis on postoperative day 1 which was okay from my standpoint. Okay for discharge from orthopedic standpoint once deemed medically stable per hospitalist team and other medical consultants. I would like to see her back in 2 weeks for a wound check in the office.   Radene Journey, MD

## 2020-10-10 NOTE — Progress Notes (Addendum)
PROGRESS NOTE    [name]   ASN:[id number]  DOB: August 26, 1966  DOA: 10/06/2020 PCP: Frazier Richards, MD   Brief Narrative:  [name] is a 54 year old female who lives with her spouse and uses a wheelchair and has the following medical problems: type II DM with peripheral neuropathy, GERD, hyperlipidemia, paroxysmal atrial fibrillation on Xarelto, PAD, s/p left AKA, ischemic cardiomyopathy, ongoing tobacco use disorder, bilateral carotid artery stenosis, presented to ED due to worsening chronic right foot ulcer with associated foul smell.  Admitted for right first toe/right medial foot wet gangrene, osteomyelitis and cellulitis complicating chronic wound, DM with peripheral neuropathy and PAD.  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   Subjective: Having some pain in right stump currently. Wound vac has been removed. She has no other complaints.     Assessment & Plan:   Principal Problem: Right foot osteomyelitis with wet gangrene and cellulitis in setting of diabetic peripheral neuropathy and PAD with Left AKA -Has undergone many lower extremity surgeries including bypass surgery in the past -Continue Crestor - The patient underwent a BKA on 11/20 and appears to be recuperating well. 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 If secondary to Vanc  - consulted renal on 11/22- recommended IVF and gave 1 U PRBC - renal ultrasound unrevealing - renal would like to continue IVF today and f/u Cr in AM-  - she will need to continue to follow with nephrology as outpt- she was lost to follow up in the past  Hypokalemia  - replaced today- recheck tomorrow  Low normal B12 level - given a dose of s/c B12 today- can start oral tomorrow  Ischemic cardiomyopathy- chronic mild systolic and diastolic CHF -last ECHO 1/93> systolic function of 79-02%. Echo  evidence of impaired relaxation diastolic filling patterns - cont Crestor, Lopressor and Xarelto    DM2 (diabetes mellitus, type 2) with peripheral neuropathy and retinopathy -Patient is on Lantus and NovoLog at home according to the med rec -In the hospital she is on a sliding scale which is being continued -Blood sugar in the 1-200 range- Lantus 10 U started 11/23 and improvement noted- no adjustments today -Continue Lyrica- will ask pharmacy to dose for renal function today    Paroxysmal atrial fibrillation - in NSR at this time -Continue metoprolol with holding parameters to prevent RVR - resumed Xarelto 11/21 per ortho (she takes it in evenings)-I discussed this with Dr. Lucia Gaskins  Hypotension -BP in the 90s-follow-holding parameters on metoprolol  Anemia, normocytic  - follow- Hb 8.5 when admitted - Hb drifted down  to 7.8 - given 1 U PRBC by renal on 11/22 - Hb now 8.3- follow  Obesity - BMI inaccurate as she has LE amputations    Time spent in minutes: 35 DVT prophylaxis: Xarelto Code Status: Full code Family Communication:  Disposition Plan:  Status is: Inpatient  Remains inpatient appropriate because: managing renal function with IVF- can go home once renal clearance given   Dispo:  Patient From: Home  Planned Disposition: Home with [facility] Care [facility]     Expected discharge date: 10/10/20  Medically stable for discharge: No Consultants:   Ortho  Nephrology Procedures:   Right BKA Antimicrobials:  Anti-infectives (From admission, onward)   Start     Dose/Rate Route Frequency Ordered Stop   10/10/20 2200  vancomycin (VANCOREADY) IVPB 500 mg/100 mL  Status:  Discontinued        500 mg 100 mL/hr over 60 Minutes Intravenous Every 24 hours 10/09/20 1307 10/09/20 1524   10/09/20 1615  doxycycline (VIBRAMYCIN) 100 mg in sodium chloride 0.9 % 250 mL IVPB        100 mg 125 mL/hr over 120 Minutes Intravenous Every 12 hours 10/09/20 1525     10/08/20 2300  vancomycin (VANCOREADY) IVPB 1250 mg/250 mL  Status:  Discontinued        1,250 mg 166.7 mL/hr over 90 Minutes Intravenous Every 48 hours 10/06/20 2221 10/09/20 1307   10/08/20 0600  ceFAZolin (ANCEF) IVPB 2g/100 mL premix  Status:  Discontinued        2 g 200 mL/hr over 30 Minutes Intravenous On call to O.R. 10/07/20 1423 10/07/20 1430   10/07/20 2130  ceFEPIme (MAXIPIME) 2 g in sodium chloride 0.9 % 100 mL IVPB  Status:  Discontinued        2 g 200 mL/hr over 30 Minutes Intravenous Every 24 hours 10/06/20 2221 10/09/20 1303   10/06/20 2230  metroNIDAZOLE (FLAGYL) tablet 500 mg  Status:  Discontinued        500 mg Oral Every 8 hours 10/06/20 2224 10/09/20 1303   10/06/20 2215  metroNIDAZOLE (FLAGYL) IVPB 500 mg  Status:  Discontinued        500 mg 100 mL/hr over 60 Minutes Intravenous Every 8 hours 10/06/20 2201 10/06/20 2224   10/06/20 2015  vancomycin (VANCOCIN) IVPB 1000 mg/200 mL premix  Status:  Discontinued        1,000 mg 200 mL/hr over 60 Minutes Intravenous  Once 10/06/20 2001 10/06/20 2011   10/06/20 2015  ceFEPIme (MAXIPIME) 1 g in sodium chloride 0.9 % 100 mL IVPB  Status:  Discontinued        1 g 200 mL/hr over 30 Minutes Intravenous  Once 10/06/20 2001 10/06/20 2011   10/06/20 2015  ceFEPIme (MAXIPIME) 2 g in sodium chloride 0.9 % 100 mL IVPB        2 g 200 mL/hr over  30 Minutes Intravenous  Once 10/06/20 2011 10/06/20 2234   10/06/20 2015  vancomycin (VANCOREADY) IVPB 1500 mg/300 mL        1,500 mg 150 mL/hr over 120 Minutes Intravenous  Once 10/06/20 2011 10/07/20 0146       Objective: Vitals:   10/09/20 2158 10/10/20 0030 10/10/20 0400 10/10/20 0756  BP: (!) 98/53 (!) 98/50 (!) 99/56 (!) 108/57  Pulse: 72 70 85 74  Resp: 16 16 18 18   Temp: 98.7 F (37.1 C) 98.6 F (37 C) 98.6 F (37 C) 98.5 F (36.9 C)  TempSrc: Oral Oral Oral Oral  SpO2: 96% 96% 95% 93%  Weight:      Height:        Intake/Output Summary (Last 24 hours) at 10/10/2020 1139 Last data filed at 10/10/2020 0802 Gross per 24 hour  Intake 1903.39 ml  Output 0 ml  Net 1903.39 ml   Filed Weights   10/06/20 2200  Weight: 72.6 kg    Examination: General exam: Appears comfortable  HEENT: PERRLA, oral mucosa moist, no sclera icterus or thrush Respiratory system: Clear to auscultation. Respiratory effort normal. Cardiovascular system: S1 & S2 heard,  No murmurs  Gastrointestinal system: Abdomen soft, non-tender, nondistended. Normal bowel sounds   Central nervous system: Alert and oriented. No focal neurological deficits. Extremities: No cyanosis, clubbing or edema- right stump in dressing- not opened Skin: No rashes or ulcers Psychiatry:  Mood & affect appropriate.    Data Reviewed: I have personally reviewed following labs and imaging studies  CBC: Recent Labs  Lab 10/06/20 1343 10/07/20 0153 10/08/20 0647 10/09/20 0906 10/10/20 0056  WBC 8.4 6.3 6.2 7.6 6.7  NEUTROABS 7.1 4.5 4.8 5.0  --   HGB 8.5* 7.9* 8.5* 7.8* 8.3*  HCT 29.1* 27.2* 28.4* 26.7* 27.5*  MCV 85.1 85.5 86.3 85.0 84.4  PLT 285 253 262 274 332   Basic Metabolic Panel: Recent Labs  Lab 10/06/20 1343 10/07/20 0153 10/08/20 0647 10/09/20 0906 10/10/20 0056  NA 132* 134* 137 136 138  K 3.7 3.6 3.8 3.5 3.4*  CL 101 101 107 107 108  CO2 19* 21* 19* 18* 18*  GLUCOSE 169* 132* 219* 148*  155*  BUN 37* 36* 40* 45* 45*  CREATININE 2.42* 2.34* 2.36* 2.85* 2.73*  CALCIUM 8.0* 7.8* 7.9* 8.0* 7.8*   GFR: Estimated Creatinine Clearance: 22.4 mL/min (A) (by C-G formula based on SCr of 2.73 mg/dL (H)). Liver Function Tests: Recent Labs  Lab 10/06/20 1343 10/07/20 0153 10/08/20 0647 10/09/20 0906  AST 9* 10* 13* 13*  ALT 8 8 7 6   ALKPHOS 95 85 81 80  BILITOT 0.5 0.5 0.5 0.8  PROT 6.7 6.2* 6.0* 6.3*  ALBUMIN 2.4* 2.2* 2.2* 2.3*   No results for input(s): LIPASE, AMYLASE in the last 168 hours. No results for input(s): AMMONIA in the last 168 hours. Coagulation Profile: No results for input(s): INR, PROTIME in the last 168 hours. Cardiac Enzymes: No results for input(s): CKTOTAL, CKMB, CKMBINDEX, TROPONINI in the last 168 hours. BNP (last 3 results) No results for input(s): PROBNP in the last 8760 hours. HbA1C: No results for input(s): HGBA1C in the last 72 hours. CBG: Recent Labs  Lab 10/09/20 0627 10/09/20 1134 10/09/20 1741 10/09/20 1949 10/10/20 0653  GLUCAP 125* 98 139* 103* 130*   Lipid Profile: No results for input(s): CHOL, HDL, LDLCALC, TRIG, CHOLHDL, LDLDIRECT in the last 72 hours. Thyroid Function Tests: No results for input(s): TSH, T4TOTAL, FREET4, T3FREE, THYROIDAB in the last 72 hours. Anemia Panel: Recent Labs    10/09/20 1727 10/10/20 0056  VITAMINB12  --  180  FOLATE  --  7.1  FERRITIN 126  --   TIBC 241*  --   IRON 26*  --   RETICCTPCT 1.7  --    Urine analysis:    Component Value Date/Time   COLORURINE YELLOW 10/09/2020 1443   APPEARANCEUR HAZY (A) 10/09/2020 1443   LABSPEC 1.011 10/09/2020 1443   PHURINE 5.0 10/09/2020 1443   GLUCOSEU 50 (A) 10/09/2020 1443   HGBUR MODERATE (A) 10/09/2020 1443   BILIRUBINUR NEGATIVE 10/09/2020 1443  KETONESUR NEGATIVE 10/09/2020 1443   PROTEINUR 100 (A) 10/09/2020 1443   UROBILINOGEN 0.2 06/25/2011 1707   NITRITE NEGATIVE 10/09/2020 1443   LEUKOCYTESUR TRACE (A) 10/09/2020 1443    Sepsis Labs: @LABRCNTIP (procalcitonin:4,lacticidven:4) ) Recent Results (from the past 240 hour(s))  Blood culture (routine x 2)     Status: None (Preliminary result)   Collection Time: 10/06/20  8:08 PM   Specimen: BLOOD  Result Value Ref Range Status   Specimen Description BLOOD RIGHT ANTECUBITAL  Final   Special Requests   Final    BOTTLES DRAWN AEROBIC AND ANAEROBIC Blood Culture results may not be optimal due to an inadequate volume of blood received in culture bottles   Culture   Final    NO GROWTH 4 DAYS Performed at [facility], [place] [address], [place], [place] [address]    Report Status PENDING  Incomplete  Blood culture (routine x 2)     Status: None (Preliminary result)   Collection Time: 10/06/20  8:36 PM   Specimen: BLOOD LEFT FOREARM  Result Value Ref Range Status   Specimen Description BLOOD LEFT FOREARM  Final   Special Requests   Final    BOTTLES DRAWN AEROBIC AND ANAEROBIC Blood Culture results may not be optimal due to an inadequate volume of blood received in culture bottles   Culture   Final    NO GROWTH 4 DAYS Performed at [facility], [place] [address] [place], [place] [address]    Report Status PENDING  Incomplete  Respiratory Panel by RT PCR (Flu A&B, Covid) - Nasopharyngeal Swab     Status: None   Collection Time: 10/06/20  8:50 PM   Specimen: Nasopharyngeal Swab; Nasopharyngeal(NP) swabs in vial transport medium  Result Value Ref Range Status   SARS Coronavirus 2 by RT PCR NEGATIVE NEGATIVE Final    Comment: (NOTE) SARS-CoV-2 target nucleic acids are NOT DETECTED.  The SARS-CoV-2 RNA is generally detectable in upper respiratoy specimens during the acute phase of infection. The lowest concentration of SARS-CoV-2 viral copies this assay can detect is 131 copies/mL. A negative result does not preclude SARS-Cov-2 infection and should not be used as the sole basis for treatment or other patient management decisions. A negative result may  occur with  improper specimen collection/handling, submission of specimen other than nasopharyngeal swab, presence of viral mutation(s) within the areas targeted by this assay, and inadequate number of viral copies (<131 copies/mL). A negative result must be combined with clinical observations, patient history, and epidemiological information. The expected result is Negative.  Fact Sheet for Patients:  PinkCheek.be  Fact Sheet for Healthcare Providers:  GravelBags.it  This test is no t yet approved or cleared by the [place] FDA and  has been authorized for detection and/or diagnosis of SARS-CoV-2 by FDA under an Emergency Use Authorization (EUA). This EUA will remain  in effect (meaning this test can be used) for the duration of the COVID-19 declaration under Section 564(b)(1) of the Act, 21 U.S.C. section 360bbb-3(b)(1), unless the authorization is terminated or revoked sooner.     Influenza A by PCR NEGATIVE NEGATIVE Final   Influenza B by PCR NEGATIVE NEGATIVE Final    Comment: (NOTE) The Xpert Xpress SARS-CoV-2/FLU/RSV assay is intended as an aid in  the diagnosis of influenza from Nasopharyngeal swab specimens and  should not be used as a sole basis for treatment. Nasal washings and  aspirates are unacceptable for Xpert Xpress SARS-CoV-2/FLU/RSV  testing.  Fact Sheet for  Patients: PinkCheek.be  Fact Sheet for Healthcare Providers: GravelBags.it  This test is not yet approved or cleared by the [place] FDA and  has been authorized for detection and/or diagnosis of SARS-CoV-2 by  FDA under an Emergency Use Authorization (EUA). This EUA will remain  in effect (meaning this test can be used) for the duration of the  Covid-19 declaration under Section 564(b)(1) of the Act, 21  U.S.C. section 360bbb-3(b)(1), unless the authorization is  terminated or  revoked. Performed at [facility], [place] [address], [place], [place] [address]          Radiology Studies: US RENAL  Result Date: 10/10/2020 CLINICAL DATA:  Acute kidney injury. History of diabetes and peripheral vascular disease. EXAM: RENAL / URINARY TRACT ULTRASOUND COMPLETE COMPARISON:  04/25/2018 FINDINGS: Right Kidney: Renal measurements: 11.4 x 5.7 x 5.2 cm = volume: 174 mL. Echogenicity within normal limits. No mass or hydronephrosis visualized. Left Kidney: Renal measurements: 11.7 x 6.6 x 5.9 cm = volume: 238 mL. Echogenicity within normal limits. No mass or hydronephrosis visualized. Bladder: Normal appearance of the bladder. No wall thickening or filling defects. Other: None. IMPRESSION: Normal ultrasound appearance of the kidneys and bladder. No hydronephrosis. Electronically Signed   By: Lucienne Capers M.D.   On: 10/10/2020 04:28      Scheduled Meds: . amitriptyline  25 mg Oral QHS  . cyanocobalamin  1,000 mcg Subcutaneous Once  . ferrous sulfate  325 mg Oral BID WC  . insulin aspart  0-5 Units Subcutaneous QHS  . insulin aspart  0-9 Units Subcutaneous TID WC  . insulin glargine  10 Units Subcutaneous QHS  . metoprolol tartrate  12.5 mg Oral BID  . nicotine  21 mg Transdermal Daily  . pantoprazole  40 mg Oral Daily  . potassium chloride  20 mEq Oral Once  . pregabalin  150 mg Oral BID  . pregabalin  200 mg Oral Q1500  . Rivaroxaban  15 mg Oral Q supper  . rosuvastatin  20 mg Oral q1800  . sodium bicarbonate  650 mg Oral BID   Continuous Infusions: . doxycycline (VIBRAMYCIN) IV 100 mg (10/10/20 0521)  . lactated ringers       LOS: 4 days      Debbe Odea, MD [name] Pager: [email or web address] 10/10/2020, 11:39 AM

## 2020-10-10 NOTE — Progress Notes (Signed)
PT Cancellation Note  Patient Details Name: [name] MRN: [id number] DOB: 03-Mar-1966   Cancelled Treatment:    Reason Eval/Treat Not Completed: Other (comment)   Pt is politely declining PT as she is in too much pain since her dressing change;   Will plan to return for PT session in the morning tomorrow in anticipation of dc home;   Roney Marion, [place] Pager [phone] Office [phone]    Colletta [place] 10/10/2020, 2:04 PM

## 2020-10-10 NOTE — Care Management Important Message (Signed)
Important Message  Patient Details  Name: [name] MRN: [id number] Date of Birth: Aug 28, 1966   Medicare Important Message Given:  Yes     [name] 10/10/2020, 10:29 AM

## 2020-10-10 NOTE — Progress Notes (Signed)
[facility] Progress Note  Name: [name] MRN: [id number] DOB: [birth year]  Chief Complaint:  Right foot ulcer  Subjective:  She had 500 mL UOP over 11/22 and 2 other unmeasured voids.  Strict ins/outs not availble.  She lives between [place] and [place].  Spoke with her husband to update.  Note bladder scanned at zero.   Review of systems:  Denies shortness of breath or chest pain  Leg pain noted  Denies nausea/vomiting ----------- Background on consult:  [name] is a 54 y.o. female history of diabetes, cardiomyopathy, peripheral vascular disease, and paroxysmal atrial fibrillation who presented to the hospital with right foot ulcer with a foul smell.  Found to have right foot osteo and wet gangrene.  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  Her creatinine has risen since admission from 2.42 on admit to 2.85 today.  See trends below.  She does have a history of renal failure.  She has had 500 mL uop over 11/22 thus far as charted and had three unmeasured voids on 11/21. She's been getting scheduled vanc.  She feels ok today.    Intake/Output Summary (Last 24 hours) at 10/10/2020 1043 Last data filed at 10/10/2020 0802 Gross per 24 hour  Intake 1903.39 ml  Output 500 ml  Net 1403.39 ml    Vitals:  Vitals:   10/09/20 2158 10/10/20 0030 10/10/20 0400 10/10/20 0756  BP: (!) 98/53 (!) 98/50 (!) 99/56 (!) 108/57  Pulse: 72 70 85 74  Resp: 16 16 18 18   Temp: 98.7 F (37.1 C) 98.6 F (37 C) 98.6 F (37 C) 98.5 F (36.9 C)  TempSrc: Oral Oral Oral Oral  SpO2: 96% 96% 95% 93%  Weight:      Height:         Physical Exam:  General: adult female in bed in NAD at rest HEENT: NCAT Eyes: EOMI sclera anicteric Neck: supple trachea midline Heart: S1S2 no rub Lungs: clear to auscultation; unlabored on room air Abdomen: soft/NT/ND obese habits Extremities: left AKA and right BKA Skin: no rash on extremities exposed Neuro: alert and oriented x 3 provides hx  and follows commands  Psych normal mood and affect  Medications reviewed   Labs:  BMP Latest Ref Rng & Units 10/10/2020 10/09/2020 10/08/2020  Glucose 70 - 99 mg/dL 155(H) 148(H) 219(H)  BUN 6 - 20 mg/dL 45(H) 45(H) 40(H)  Creatinine 0.44 - 1.00 mg/dL 2.73(H) 2.85(H) 2.36(H)  BUN/Creat Ratio 9 - 23 - - -  Sodium 135 - 145 mmol/L 138 136 137  Potassium 3.5 - 5.1 mmol/L 3.4(L) 3.5 3.8  Chloride 98 - 111 mmol/L 108 107 107  CO2 22 - 32 mmol/L 18(L) 18(L) 19(L)  Calcium 8.9 - 10.3 mg/dL 7.8(L) 8.0(L) 7.9(L)     Assessment/Plan:   # AKI  - Felt most recently with pre-renal and ischemic insults with infection and hypotension, operation.  She has had previous AKI seen by [facility] inpatient and has never been seen in outpatient clinic at [facility]  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 Contaminated UA with 100 mg/dl protein and no RBC.  Renal ultrasound no hydro.  - Continue supportive care  - LR at 75/hr x 18 hours  - Strict ins/outs ordered - ordered random vanc trough to be added onto already collected labs for reference  - would reduce lyrica dose per primary team due to her renal failure - Previously lost to follow-up it seems  # CKD stage 3 -  Sporadic data with 01/22/2019 Cr 1.60  # Right foot gangrene - S/p right BKA on 11/20  - per ortho    # Proteinuria 2/2 DM  - not a candidate for ACE inhibitor or ARB with her AKI and her hypotension  # Hypotension  - improved   # Anemia normocytic  - transfused PRBC's on 64/40  # Metabolic acidosis  - continue bicarbonate supplement   # Afib  - Rate control per primary team - spoke with attending; there are hold parameters for metoprolol   Disposition.  Would continue inpatient monitoring today and fluids as above and may be able to be discharged on 11/24 per primary team if renal function stable.  Will need follow-up with [facility] two weeks after discharge which I will set up on discharge.   Claudia Desanctis, MD 10/10/2020 11:02 AM

## 2020-10-10 NOTE — Progress Notes (Signed)
Pharmacy Consult:  Pharmacy consulted to adjust pregabalin for renal function. Patient currently on pregabalin 150mg  PO BID + 200mg  Qafternoon, current CrCl ~20-25.  Plan: Adjust pregabalin to 75mg  PO BID for now F/u SCr trend and adjust as needed   Arturo Morton, PharmD, BCPS Please check AMION for all [facility] contact numbers Clinical Pharmacist 10/10/2020 12:21 PM

## 2020-10-11 DIAGNOSIS — E1152 Type 2 diabetes mellitus with diabetic peripheral angiopathy with gangrene: Secondary | ICD-10-CM | POA: Diagnosis not present

## 2020-10-11 DIAGNOSIS — I480 Paroxysmal atrial fibrillation: Secondary | ICD-10-CM | POA: Diagnosis not present

## 2020-10-11 DIAGNOSIS — M869 Osteomyelitis, unspecified: Secondary | ICD-10-CM | POA: Diagnosis not present

## 2020-10-11 DIAGNOSIS — I739 Peripheral vascular disease, unspecified: Secondary | ICD-10-CM | POA: Diagnosis not present

## 2020-10-11 LAB — CULTURE, BLOOD (ROUTINE X 2)
Culture: NO GROWTH
Culture: NO GROWTH

## 2020-10-11 LAB — RENAL FUNCTION PANEL
Albumin: 2 g/dL — ABNORMAL LOW (ref 3.5–5.0)
Anion gap: 11 (ref 5–15)
BUN: 44 mg/dL — ABNORMAL HIGH (ref 6–20)
CO2: 19 mmol/L — ABNORMAL LOW (ref 22–32)
Calcium: 7.9 mg/dL — ABNORMAL LOW (ref 8.9–10.3)
Chloride: 109 mmol/L (ref 98–111)
Creatinine, Ser: 2.61 mg/dL — ABNORMAL HIGH (ref 0.44–1.00)
GFR, Estimated: 21 mL/min — ABNORMAL LOW
Glucose, Bld: 134 mg/dL — ABNORMAL HIGH (ref 70–99)
Phosphorus: 3.9 mg/dL (ref 2.5–4.6)
Potassium: 4.7 mmol/L (ref 3.5–5.1)
Sodium: 139 mmol/L (ref 135–145)

## 2020-10-11 LAB — GLUCOSE, CAPILLARY
Glucose-Capillary: 113 mg/dL — ABNORMAL HIGH (ref 70–99)
Glucose-Capillary: 101 mg/dL — ABNORMAL HIGH (ref 70–99)

## 2020-10-11 MED ORDER — RIVAROXABAN 15 MG PO TABS
15.0000 mg | ORAL_TABLET | Freq: Every day | ORAL | 0 refills | Status: DC
Start: 2020-10-11 — End: 2020-12-05

## 2020-10-11 MED ORDER — PREGABALIN 50 MG PO CAPS
50.0000 mg | ORAL_CAPSULE | Freq: Two times a day (BID) | ORAL | 0 refills | Status: DC
Start: 2020-10-11 — End: 2020-10-23

## 2020-10-11 MED ORDER — CYANOCOBALAMIN 1000 MCG PO TABS
1000.0000 ug | ORAL_TABLET | Freq: Every day | ORAL | 0 refills | Status: DC
Start: 2020-10-12 — End: 2021-07-30

## 2020-10-11 MED ORDER — DOXYCYCLINE HYCLATE 100 MG PO TABS: 100.0000 mg | ORAL_TABLET | Freq: Two times a day (BID) | ORAL | Status: DC

## 2020-10-11 MED ORDER — DOXYCYCLINE HYCLATE 100 MG PO TABS: 100.0000 mg | ORAL_TABLET | Freq: Two times a day (BID) | ORAL | 0 refills | Status: AC

## 2020-10-11 MED ORDER — OXYCODONE HCL 5 MG PO TABS
5.0000 mg | ORAL_TABLET | Freq: Four times a day (QID) | ORAL | 0 refills | Status: DC | PRN
Start: 2020-10-11 — End: 2020-12-02

## 2020-10-11 MED ORDER — AMITRIPTYLINE HCL 25 MG PO TABS
25.0000 mg | ORAL_TABLET | Freq: Every day | ORAL | 0 refills | Status: DC
Start: 2020-10-11 — End: 2020-12-05

## 2020-10-11 MED ORDER — FUROSEMIDE 20 MG PO TABS: 40.0000 mg | ORAL_TABLET | ORAL | 0 refills | Status: DC

## 2020-10-11 MED ORDER — SODIUM BICARBONATE 650 MG PO TABS
650.0000 mg | ORAL_TABLET | Freq: Two times a day (BID) | ORAL | 0 refills | Status: DC
Start: 2020-10-11 — End: 2020-12-05

## 2020-10-11 NOTE — Progress Notes (Signed)
[facility] Progress Note  Name: [name] MRN: [id number] DOB: 23-May-1966  Chief Complaint:  Right foot ulcer  Subjective:  Strict ins/outs not available.  She had 200 mL UOP over 11/23 and 2 other unmeasured voids. She states she has been prescribed lasix 40 mg daily but really only takes a couple of times a week.   This wasn't on her home med list.  She states she had swelling before taking.   Review of systems:   Denies shortness of breath or chest pain  Leg pain noted  Denies nausea/vomiting today  ----------- Background on consult:  [name] is a 54 y.o. female history of diabetes, cardiomyopathy, peripheral vascular disease, and paroxysmal atrial fibrillation who presented to the hospital with right foot ulcer with a foul smell.  Found to have right foot osteo and wet gangrene.  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  Her creatinine has risen since admission from 2.42 on admit to 2.85 today.  See trends below.  She does have a history of renal failure.  She has had 500 mL uop over 11/22 thus far as charted and had three unmeasured voids on 11/21. She's been getting scheduled vanc.  She feels ok today.    Intake/Output Summary (Last 24 hours) at 10/11/2020 1118 Last data filed at 10/11/2020 1100 Gross per 24 hour  Intake 896.52 ml  Output 700 ml  Net 196.52 ml    Vitals:  Vitals:   10/10/20 1348 10/10/20 2056 10/11/20 0412 10/11/20 0817  BP: 115/61 (!) 91/42 (!) 109/58 134/70  Pulse: 68 70 65 84  Resp: 18 14 16 16   Temp: 97.8 F (36.6 C) 98.2 F (36.8 C) 98.2 F (36.8 C) 98.1 F (36.7 C)  TempSrc: Oral Oral Oral Oral  SpO2: 100% 90% 100% 100%  Weight:      Height:         Physical Exam:  General: adult female in bed in NAD at rest HEENT: NCAT Eyes: EOMI sclera anicteric Neck: supple trachea midline Heart: S1S2 no rub Lungs: clear to auscultation; unlabored on room air Abdomen: soft/NT/ND obese habits Extremities: left AKA and right  BKA Skin: no rash on extremities exposed Neuro: alert and oriented x 3 provides hx and follows commands  Psych normal mood and affect  Medications reviewed   Labs:  BMP Latest Ref Rng & Units 10/11/2020 10/10/2020 10/09/2020  Glucose 70 - 99 mg/dL 134(H) 155(H) 148(H)  BUN 6 - 20 mg/dL 44(H) 45(H) 45(H)  Creatinine 0.44 - 1.00 mg/dL 2.61(H) 2.73(H) 2.85(H)  BUN/Creat Ratio 9 - 23 - - -  Sodium 135 - 145 mmol/L 139 138 136  Potassium 3.5 - 5.1 mmol/L 4.7 3.4(L) 3.5  Chloride 98 - 111 mmol/L 109 108 107  CO2 22 - 32 mmol/L 19(L) 18(L) 18(L)  Calcium 8.9 - 10.3 mg/dL 7.9(L) 7.8(L) 8.0(L)     Assessment/Plan:   # AKI  - Felt most recently with pre-renal and ischemic insults with infection and hypotension, operation.  She has had previous AKI seen by [facility] inpatient and has never been seen in outpatient clinic at [facility].  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 Contaminated UA with 100 mg/dl protein and no RBC.  Renal ultrasound no hydro. Vanc may have contributed as well  - Continue supportive care  - Renal diet - Strict ins/outs ordered - Previously lost to follow-up it seems - have discussed the importance of follow-up in clinic for hx of AKI and for  CKD care - Can resume lasix 40 mg twice a week on Mondays and Thursdays which seems similar to what she was doing before.  If shortness of breath or swelling increase to daily as needed.  I told her to start back on 11/29 unless needed sooner.  We've been giving her fluids here.  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  Stable for discharge from a strictly renal standpoint. Will need follow-up with [facility] two weeks after discharge which I will set up - [place] vs [place] office depending on availability.    Claudia Desanctis, MD 10/11/2020 11:38 AM

## 2020-10-11 NOTE — TOC Transition Note (Addendum)
Transition of Care (TOC) - CM/SW Discharge Note [name] RN, [name] Transitions of Care Unit 4E- RN Case Manager See Treatment Team for direct phone # Cross Coverage for 5N   Patient Details  Name: [name] MRN: [id number] Date of Birth: [birth year]  Transition of Care [facility]) CM/SW Contact:  Dawayne Patricia, RN Phone Number: 10/11/2020, 12:45 PM   Clinical Narrative:    Pt stable for transition home today- orders placed for HHPT/OT- CM spoke with pt at bedside- pt agreeable to [facility] services- list provided for [facility] choice Per CMS guidelines from medicare.gov website with star ratings (copy placed in shadow chart)- per pt she has used [facility] in past and would like to see if they can provide services if not then she does not have a preference and will defer to this CM to secure and agency, discussed that there may be a delayed start of care due to the holiday and pt voiced understanding and is agreeable to start of care next week i. Per pt she has all needed DME- she did ask about a new roho cushion for her wheelchair which she will need to f/u with her primary care MD about.   Pt has transportation home,   Call made to [facility] for [facility] referral- PT/OT- awaiting return call to see if they can accept.  [facility]- unable to accept due to staffing during holiday [facility]- delay in start of care until end of next week [facility]- able to accept with start of care after the holiday - first of next week  Final next level of care: [place] Barriers to Discharge: No Barriers Identified   Patient Goals and CMS Choice Patient states their goals for this hospitalization and ongoing recovery are:: return home CMS Medicare.gov Compare Post Acute Care list provided to:: Patient Choice offered to / list presented to : Patient  Discharge Placement               Home with [facility]        Discharge Plan and Services   Discharge Planning Services: CM Consult Post Acute Care Choice: Home Health           DME Arranged: N/A DME Agency: NA       HH Arranged: PT, OT [place] Agency: [place] ([place]) Date [place]: 10/11/20 Time [place]: 4403 Representative spoke with at [place]: [place] ([place]) Interventions     Readmission Risk Interventions Readmission Risk Prevention Plan 10/11/2020  Transportation Screening Complete  PCP or Specialist Appt within 5-7 Days Complete  Home Care Screening Complete  Medication Review (RN CM) Complete  Some recent data might be hidden

## 2020-10-11 NOTE — Progress Notes (Signed)
Occupational Therapy Treatment Patient Details Name: [name] MRN: [id number] DOB: 1966-03-09 Today's Date: 10/11/2020    History of present illness 54 y.o. female With long-standing uncontrolled diabetes and vasculopathy presented to the emergency department with systemic symptoms consistent with sepsis and evidence of wet gangrene of her right foot medially and dorsally and plantarly. Now s/p BKA with VAC placement   OT comments  Pt progressing towards OT goals and motivated to maximize independence. Pt able to demonstrate use of lateral leans and side to side rolling in bed to complete LB dressing with Supervision. Collaborated with pt and spouse on ADL routine and home setup with safety recommendations/techniques provided. Pt's husband brought electric scooter and sliding board in room to trial in collaboration with PT/OT. Pt overall min guard for sliding board transfer to scooter with intermittent cues for safe sequencing. Pt active in problem solving and able to self direct assistance well. Recommend HHOT/PT follow-up with 24/7 assist (pt reports she has 24/7 support). Pt would also benefit from new roho cushion to prevent skin breakdown.    Follow Up Recommendations  Home health OT;Supervision/Assistance - 24 hour    Equipment Recommendations  Wheelchair cushion (measurements OT) (needs new roho cushion for skin integrity maintenance)    Recommendations for Other Services      Precautions / Restrictions Precautions Precautions: Fall;Other (comment) Precaution Comments: legally blind, hx of L AKA Restrictions Weight Bearing Restrictions: Yes [name] Weight Bearing: Non weight bearing LLE Weight Bearing: Non weight bearing       Mobility Bed Mobility Overal bed mobility: Needs Assistance Bed Mobility: Supine to Sit;Rolling Rolling: Supervision   Supine to sit: Min assist     General bed mobility comments: Able to demo sitting EOB with Supervision then with fatigue  requested Min handheld assist from husband  Transfers Overall transfer level: Needs assistance Equipment used: Sliding board Transfers: Lateral/Scoot Transfers          Lateral/Scoot Transfers: Min guard General transfer comment: min guard for sliding board transfer from bed to electric scooter. Cues for safe positioning and technique with pt assisting in problem solving. Unable to complete "uphill" transfer and bed raised to match scooter height    Balance Overall balance assessment: Needs assistance Sitting-balance support: No upper extremity supported;Single extremity supported Sitting balance-Leahy Scale: Good                                     ADL either performed or assessed with clinical judgement   ADL Overall ADL's : Needs assistance/impaired Eating/Feeding: Set up;Sitting Eating/Feeding Details (indicate cue type and reason): Setup for drinking from cup during session                 Lower Body Dressing: Supervision/safety;Sitting/lateral leans;Bed level Lower Body Dressing Details (indicate cue type and reason): Supervision for donning underwear/pants with lateral leans and rolling side to side in bed. Educated on easier clothing to manage at home               General ADL Comments: Pt limited by new amputation, decreased strength and blindness. Though very motivated to improve     Vision   Vision Assessment?: Vision impaired- to be further tested in functional context Additional Comments: legally blind   Perception     Praxis      Cognition Arousal/Alertness: Awake/alert Behavior During Therapy: WFL for tasks assessed/performed Overall Cognitive Status: Within Functional Limits  for tasks assessed                                          Exercises     Shoulder Instructions       General Comments Husband present and assisting during session. Utilized Paediatric nurse and sliding board during session. Pt  reports well equipped though would like new air cushion for wheelchair due to decreased skin integrity    Pertinent Vitals/ Pain       Pain Assessment: Faces Faces Pain Scale: Hurts little more Pain Location: R residual limb Pain Descriptors / Indicators: Discomfort;Grimacing Pain Intervention(s): Monitored during session  Home Living                                          Prior Functioning/Environment              Frequency  Min 2X/week        Progress Toward Goals  OT Goals(current goals can now be found in the care plan section)  Progress towards OT goals: Progressing toward goals  Acute Rehab OT Goals Patient Stated Goal: wants to be as independent as possible OT Goal Formulation: With patient Time For Goal Achievement: 10/22/20 Potential to Achieve Goals: Good ADL Goals Pt Will Perform Grooming: with modified independence;sitting Pt Will Perform Lower Body Dressing: with supervision;sitting/lateral leans;bed level Pt Will Transfer to Toilet: with min guard assist;with transfer board;anterior/posterior transfer Pt/caregiver will Perform Home Exercise Program: Increased strength;Both right and left upper extremity;With Supervision Additional ADL Goal #1: Pt will perform ADL functional transfers with supervisionA for safety.  Plan Discharge plan needs to be updated    Co-evaluation                 AM-PAC OT "6 Clicks" Daily Activity     Outcome Measure   Help from another person eating meals?: None Help from another person taking care of personal grooming?: A Little Help from another person toileting, which includes using toliet, bedpan, or urinal?: A Little Help from another person bathing (including washing, rinsing, drying)?: A Little Help from another person to put on and taking off regular upper body clothing?: A Little Help from another person to put on and taking off regular lower body clothing?: A Little 6 Click Score: 19     End of Session Equipment Utilized During Treatment: Other (comment) (sliding board, electric scooter)  OT Visit Diagnosis: Muscle weakness (generalized) (M62.81);Pain;Other abnormalities of gait and mobility (R26.89) Pain - Right/Left: Right Pain - part of body: Leg   Activity Tolerance Patient tolerated treatment well   Patient Left Other (comment);with family/visitor present (with PT on electric scooter)   Nurse Communication Mobility status        Time: 3491-7915 OT Time Calculation (min): 41 min  Charges: OT General Charges $OT Visit: 1 Visit OT Treatments $Self Care/Home Management : 8-22 mins $Therapeutic Activity: 8-22 mins  Layla Maw, OTR/L   Layla Maw 10/11/2020, 11:07 AM

## 2020-10-11 NOTE — Progress Notes (Signed)
Physical Therapy Treatment Patient Details Name: [name] MRN: [id number] DOB: 11-13-1966 Today's Date: 10/11/2020    History of Present Illness 54 y.o. female With long-standing uncontrolled diabetes and vasculopathy presented to the emergency department with systemic symptoms consistent with sepsis and evidence of wet gangrene of her right foot medially and dorsally and plantarly. Now s/p BKA with VAC placement    PT Comments    Continuing work on functional mobility and activity tolerance;  Session focused on lateral scoot transfers in prep for dc home today; Used a sliding board for transferring to and from the new power scooter that her husband just rented; took extra time to get to know the features of the scooter, including the ability to turn and lock the seat; [place] for safety with lateral scooting with sliding board, with intermittent cues for safe sequencing. Pt active in problem solving and able to self direct assistance well; Pt and husband voice confidence in ability to perform car transfer;   Pt asked about getting a higher profile seat cushion for pressure redistribution, and I agree that this would be helpful; Recommend she follow up with her PCP for a referral for a Specialized Seating and Mobility Evaluation   Follow Up Recommendations  Home health PT     Equipment Recommendations  Wheelchair cushion (measurements PT)    Recommendations for Other Services  Specialized Seating and Mobility Evaluation     Precautions / Restrictions Precautions Precautions: Fall;Other (comment) Precaution Comments: legally blind, hx of L AKA Restrictions Weight Bearing Restrictions: Yes [name] Weight Bearing: Non weight bearing LLE Weight Bearing: Non weight bearing    Mobility  Bed Mobility Overal bed mobility: Needs Assistance Bed Mobility: Supine to Sit;Rolling Rolling: Supervision   Supine to sit: Min assist     General bed mobility comments: Able to demo sitting  EOB with Supervision then with fatigue requested Min handheld assist from husband  Transfers Overall transfer level: Needs assistance Equipment used: Sliding board Transfers: Lateral/Scoot Transfers          Lateral/Scoot Transfers: Min guard General transfer comment: min guard for sliding board transfer from bed to electric scooter. Cues for safe positioning and technique with pt assisting in problem solving. Unable to complete "uphill" transfer and bed raised to match scooter height  Ambulation/Gait                 Stairs             Wheelchair Mobility    Modified Rankin (Stroke Patients Only)       Balance Overall balance assessment: Needs assistance Sitting-balance support: No upper extremity supported;Single extremity supported Sitting balance-Leahy Scale: Good Sitting balance - Comments: Showing excellent weihgt shifting for lateral sccoting and reciprocal scooting, as well as good leaning to place sliding board, and take it out                                    Cognition Arousal/Alertness: Awake/alert Behavior During Therapy: WFL for tasks assessed/performed Overall Cognitive Status: Within Functional Limits for tasks assessed                                 General Comments: at times tangential      Exercises      General Comments General comments (skin integrity, edema, etc.): Husband present and assisting during  session. Utilized Paediatric nurse and sliding board during session. Pt reports well equipped though would like new air cushion for wheelchair due to decreased skin integrity      Pertinent Vitals/Pain Pain Assessment: Faces Faces Pain Scale: Hurts little more Pain Location: R residual limb Pain Descriptors / Indicators: Discomfort;Grimacing Pain Intervention(s): Monitored during session    Home Living                      Prior Function            PT Goals (current goals can now  be found in the care plan section) Acute Rehab PT Goals Patient Stated Goal: wants to be as independent as possible; wants to get a L prosthesis PT Goal Formulation: With patient Time For Goal Achievement: 10/22/20 Potential to Achieve Goals: Good Progress towards PT goals: Progressing toward goals    Frequency    Min 3X/week      PT Plan Other (comment);Discharge plan needs to be updated (additional recommendation of Specialized Seatign and Mobility Evaluation)    Co-evaluation PT/OT/SLP Co-Evaluation/Treatment: Yes ([place] with OT) Reason for Co-Treatment: To address functional/ADL transfers PT goals addressed during session: Mobility/safety with mobility        AM-PAC PT "6 Clicks" Mobility   Outcome Measure  Help needed turning from your back to your side while in a flat bed without using bedrails?: None Help needed moving from lying on your back to sitting on the side of a flat bed without using bedrails?: None Help needed moving to and from a bed to a chair (including a wheelchair)?: A Little Help needed standing up from a chair using your arms (e.g., wheelchair or bedside chair)?: Total Help needed to walk in hospital room?: Total Help needed climbing 3-5 steps with a railing? : Total 6 Click Score: 14    End of Session Equipment Utilized During Treatment: Other (comment) (sliding board and power scooter) Activity Tolerance: Patient tolerated treatment well Patient left: in bed;with call bell/phone within reach;with family/visitor present Nurse Communication: Mobility status PT Visit Diagnosis: Other abnormalities of gait and mobility (R26.89)     Time: 1443-1540 PT Time Calculation (min) (ACUTE ONLY): 38 min  Charges:  $Therapeutic Activity: 23-37 mins                     Roney Marion, [place] Pager [phone] Office [place] 10/11/2020, 1:14 PM

## 2020-10-11 NOTE — Discharge Summary (Signed)
Physician Discharge Summary  [name] ACZ:[id number] DOB: 07-Dec-1965 DOA: 10/06/2020  PCP: Frazier Richards, MD  Admit date: 10/06/2020 Discharge date: 10/11/2020  Admitted From: Home Disposition: Home with home health PT  Recommendations for Outpatient Follow-up:  Follow-up with PCP in 1 week Follow-up with nephrology as a scheduled Follow-up with orthopedic surgery in 2 weeks Take Lasix twice in a week as per nephrology recommendations Repeat CBC and BMP on follow-up with PCP Continue doxycycline twice daily until follow-up with orthopedic surgery Monitor weight daily.  Home Health: With PT Equipment/Devices: Wheelchair cushion Discharge Condition: Stable CODE STATUS: Full code Diet recommendation: Low-sodium diet/renal diet  Brief/Interim Summary: [name] is a 54 year old female who lives with her spouse and uses a wheelchair and has the following medical problems: type II DM with peripheral neuropathy, GERD, hyperlipidemia, paroxysmal atrial fibrillation on Xarelto, PAD, s/p left AKA, ischemic cardiomyopathy, ongoing tobacco use disorder, bilateral carotid artery stenosis, presented to ED due to worsening chronic right foot ulcer with associated foul smell. Admitted for right first toe/right medial foot wet gangrene, osteomyelitis and cellulitis complicating chronic wound, DM with peripheral neuropathy and PAD. 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  Right foot osteomyelitis with wet gangrene and cellulitis in the setting of diabetic peripheral neuropathy and PAD: -Underwent many lower extremity surgeries including bypass surgery in the past. -Underwent BKA on 11/20 and appears to be recuperating well. -She has been on antibiotics per orthopedic surgery: On IV vancomycin initially which was  changed to doxycycline considering underlying worsening kidney function. -Blood culture negative.  Wound VAC removed on 11/23. -PT recommended CIR however CIR does not  feel that she is a good candidate.  She will go home with home health PT/OT. -Discussed with Dr. Adair-recommended okay to discharge from his standpoint.  Continue doxycycline until follow-up with him outpatient.  Acute on chronic CKD stage IIIb/metabolic acidosis: -Could be secondary to bank?  Renal ultrasound: WNL. -Nephrology consulted recommended IV fluid and give 1 unit PRBC. -Discussed with nephrology Dr. Francesca Oman to discharge patient from nephrology stand point and follow-up outpatient. -Recommended Lasix 40 mg twice in a week (Mondays and Thursdays) -Continued sodium bicarb. decreased Lyrica dose -Decreased Xarelto dose from 39m to 15 mg based on her creatinine clearance.  Hypokalemia: Replenished  Low normal B12 level: Was given subcu B12.  Discharged on B12 supplements  Ischemic cardiomyopathy/chronic mild systolic and diastolic CHF: Last echo showed ejection fraction of 50 to 55%.  Evidence of impaired relaxation diastolic filling patterns.   -Continue Xarelto, Lopressor and Crestor   Diabetes mellitus with peripheral neuropathy and retinopathy: -Continued the NovoLog and Lantus. -Decrease to 50 twice daily considering underlying worsening kidney function Lyrica dose.    Paroxysmal A. fib: Patient remained in normal sinus rhythm. -Continued metoprolol and Xarelto -Holding parameters for metoprolol-hold metoprolol if leg blood pressure less than 90.  Normocytic anemia: Status post 1 unit PRBC transfusion on 11/22 as hemoglobin trended down to 7.8. -H&H remained stable.  Obesity: BMI inaccurate as she has lower extremity amputations.  discharge Diagnoses:  Right foot osteomyelitis with wet gangrene and cellulitis in setting of diabetic peripheral neuropathy, PAD AKI on CKD stage IIIb/metabolic acidosis Hypokalemia Low normal B12 level Ischemic cardiomyopathy-chronic mild systolic and diastolic CHF Type 2 diabetes mellitus with peripheral neuropathy and  retinopathy Paroxysmal A. fib Hypertension Normocytic anemia Obesity with BMI of 25  Discharge Instructions  Discharge Instructions    Diet - low sodium heart healthy   Complete by:  As directed    Diet - low sodium heart healthy   Complete by: As directed    Discharge instructions   Complete by: As directed    Follow-up with PCP in 1 week Repeat CBC and CMP on follow-up visit Follow-up with nephrology as a scheduled Take Lasix twice in a week as per nephrology recommendations Take Lyrica 50 twice daily-not more than that. Follow-up with orthopedic surgery in 2 weeks   Discharge instructions   Complete by: As directed    Follow-up with PCP in 1 week Repeat CBC and BMP on follow-up visit Follow-up with nephrology as a scheduled Take Lasix twice in a week.  Monitor weight every day Follow-up with orthopedic surgery in 2 weeks Continue doxycycline twice a daily until follow-up with orthopedic surgery   Increase activity slowly   Complete by: As directed    Increase activity slowly   Complete by: As directed    Leave dressing on - Keep it clean, dry, and intact until clinic visit   Complete by: As directed    Leave dressing on - Keep it clean, dry, and intact until clinic visit   Complete by: As directed      Allergies as of 10/11/2020      Reactions   Lactose Intolerance (gi) Diarrhea      Medication List    TAKE these medications   albuterol 2 MG tablet Commonly known as: PROVENTIL Take 2 mg by mouth daily as needed for shortness of breath.   amitriptyline 25 MG tablet Commonly known as: ELAVIL Take 1 tablet (25 mg total) by mouth at bedtime. What changed: See the new instructions.   cyanocobalamin 1000 MCG tablet Take 1 tablet (1,000 mcg total) by mouth daily. Start taking on: October 12, 2020   dicyclomine 10 MG capsule Commonly known as: BENTYL Take 10 mg by mouth 4 (four) times daily as needed for spasms.   doxycycline 100 MG tablet Commonly known  as: VIBRA-TABS Take 1 tablet (100 mg total) by mouth every 12 (twelve) hours for 14 days.   furosemide 20 MG tablet Commonly known as: Lasix Take 2 tablets (40 mg total) by mouth 2 (two) times a week. Start taking on: October 12, 2020   HYDROcodone-acetaminophen 10-325 MG tablet Commonly known as: NORCO Take 1 tablet by mouth every 2 (two) hours as needed (pain).   insulin glargine 100 UNIT/ML injection Commonly known as: LANTUS Inject 0.18 mLs (18 Units total) into the skin 2 (two) times daily. What changed: how much to take   insulin lispro 100 UNIT/ML injection Commonly known as: HumaLOG For glucose 121-150 use TWO units,  for 151-200 use FOUR units, for 201-250 use SIX units, for 251-300 use EIGHT units, for 301-350 use TEN units. For blood sugars greater than 350 call MD. What changed:   how much to take  how to take this  when to take this   metoprolol tartrate 25 MG tablet Commonly known as: LOPRESSOR Take 0.5 tablets (12.5 mg total) by mouth 2 (two) times daily.   Narcan 4 MG/0.1ML Liqd nasal spray kit Generic drug: naloxone Place 1 spray into the nose once as needed (opioid overdose).   omeprazole 40 MG capsule Commonly known as: PRILOSEC Take 40 mg by mouth daily.   omeprazole 40 MG capsule Commonly known as: PRILOSEC Take 40 mg by mouth daily.   oxyCODONE 5 MG immediate release tablet Commonly known as: Oxy IR/ROXICODONE Take 1-2 tablets (5-10 mg total) by mouth every 6 (  six) hours as needed for moderate pain, severe pain or breakthrough pain.   pregabalin 50 MG capsule Commonly known as: LYRICA Take 1 capsule (50 mg total) by mouth 2 (two) times daily. What changed:   how much to take  when to take this  additional instructions   Rivaroxaban 15 MG Tabs tablet Commonly known as: XARELTO Take 1 tablet (15 mg total) by mouth daily with supper. What changed:   medication strength  how much to take   rosuvastatin 20 MG tablet Commonly known  as: CRESTOR Take 1 tablet (20 mg total) by mouth daily at 6 PM.   sodium bicarbonate 650 MG tablet Take 1 tablet (650 mg total) by mouth 2 (two) times daily.            Discharge Care Instructions  (From admission, onward)         Start     Ordered   10/11/20 0000  Leave dressing on - Keep it clean, dry, and intact until clinic visit        10/11/20 1458   10/11/20 0000  Leave dressing on - Keep it clean, dry, and intact until clinic visit        10/11/20 1458          Follow-up Information    Erle Crocker, MD. Schedule an appointment as soon as possible for a visit in 2 weeks.   Specialty: Orthopedic Surgery Contact information: [place] [address] [phone]        Frazier Richards, MD Follow up in 1 week(s).   Specialty: Family Medicine Contact information: [place] [address] [phone]        Nelva Bush, MD .   Specialty: Cardiology Contact information: [place] [address] [place] [address] [phone]              Allergies  Allergen Reactions  . Lactose Intolerance (Gi) Diarrhea    Consultations:  Nephrology  Orthopedic surgery   Procedures/Studies: US RENAL  Result Date: 10/10/2020 CLINICAL DATA:  Acute kidney injury. History of diabetes and peripheral vascular disease. EXAM: RENAL / URINARY TRACT ULTRASOUND COMPLETE COMPARISON:  04/25/2018 FINDINGS: Right Kidney: Renal measurements: 11.4 x 5.7 x 5.2 cm = volume: 174 mL. Echogenicity within normal limits. No mass or hydronephrosis visualized. Left Kidney: Renal measurements: 11.7 x 6.6 x 5.9 cm = volume: 238 mL. Echogenicity within normal limits. No mass or hydronephrosis visualized. Bladder: Normal appearance of the bladder. No wall thickening or filling defects. Other: None. IMPRESSION: Normal ultrasound appearance of the kidneys and bladder. No hydronephrosis. Electronically Signed   By: Lucienne Capers M.D.   On:  10/10/2020 04:28   DG Foot Complete Right  Result Date: 10/06/2020 CLINICAL DATA:  Right foot fell over and pain EXAM: RIGHT FOOT COMPLETE - 3+ VIEW COMPARISON:  None. FINDINGS: There is an obliquely oriented mildly displaced fracture seen through the mid first metatarsal. Large area of ulceration overlying the first metatarsal with subcutaneous emphysema and soft tissue swelling. There is area of mottled cortical irregularity seen throughout the first metatarsal head and phalanges. Significant soft tissue swelling seen diffusely around the the dorsum of the foot. IMPRESSION: Mildly displaced fracture seen through the first metatarsal. large area of overlying ulceration and findings that could be suggestive of osteomyelitis. Electronically Signed   By: Prudencio Pair M.D.   On: 10/06/2020 20:38      Subjective:  Patient seen and examined.  Sitting comfortably on the bed.  Complaining of itching and burning sensation at IV line.  Tells me that she is upset and would like to go home today.  She said that she will follow-up with her nephrologist and orthopedic surgeon as  scheduled.  Tearful and wishes to go home.  I talked to patient's husband over the phone and discussed about plan of care and possible discharge today.  He agreed.  Discharge Exam: Vitals:   10/11/20 0412 10/11/20 0817  BP: (!) 109/58 134/70  Pulse: 65 84  Resp: 16 16  Temp: 98.2 F (36.8 C) 98.1 F (36.7 C)  SpO2: 100% 100%   Vitals:   10/10/20 1348 10/10/20 2056 10/11/20 0412 10/11/20 0817  BP: 115/61 (!) 91/42 (!) 109/58 134/70  Pulse: 68 70 65 84  Resp: '18 14 16 16  ' Temp: 97.8 F (36.6 C) 98.2 F (36.8 C) 98.2 F (36.8 C) 98.1 F (36.7 C)  TempSrc: Oral Oral Oral Oral  SpO2: 100% 90% 100% 100%  Weight:      Height:        General: Pt is alert, awake, not in acute distress, on room air, obese, tearful Cardiovascular: RRR, S1/S2 +, no rubs, no gallops Respiratory: CTA bilaterally, no wheezing, no  rhonchi Abdominal: Soft, NT, ND, bowel sounds + Extremities: Right stump: Dressing dry intact.    The results of significant diagnostics from this hospitalization (including imaging, microbiology, ancillary and laboratory) are listed below for reference.     Microbiology: Recent Results (from the past 240 hour(s))  Blood culture (routine x 2)     Status: None   Collection Time: 10/06/20  8:08 PM   Specimen: BLOOD  Result Value Ref Range Status   Specimen Description BLOOD RIGHT ANTECUBITAL  Final   Special Requests   Final    BOTTLES DRAWN AEROBIC AND ANAEROBIC Blood Culture results may not be optimal due to an inadequate volume of blood received in culture bottles   Culture   Final    NO GROWTH 5 DAYS Performed at [facility], [place] [address] [place], [place] [address]    Report Status 10/11/2020 FINAL  Final  Blood culture (routine x 2)     Status: None   Collection Time: 10/06/20  8:36 PM   Specimen: BLOOD LEFT FOREARM  Result Value Ref Range Status   Specimen Description BLOOD LEFT FOREARM  Final   Special Requests   Final    BOTTLES DRAWN AEROBIC AND ANAEROBIC Blood Culture results may not be optimal due to an inadequate volume of blood received in culture bottles   Culture   Final    NO GROWTH 5 DAYS Performed at [facility], [place] [address] [place], [place] [address]    Report Status 10/11/2020 FINAL  Final  Respiratory Panel by RT PCR (Flu A&B, Covid) - Nasopharyngeal Swab     Status: None   Collection Time: 10/06/20  8:50 PM   Specimen: Nasopharyngeal Swab; Nasopharyngeal(NP) swabs in vial transport medium  Result Value Ref Range Status   SARS Coronavirus 2 by RT PCR NEGATIVE NEGATIVE Final    Comment: (NOTE) SARS-CoV-2 target nucleic acids are NOT DETECTED.  The SARS-CoV-2 RNA is generally detectable in upper respiratoy specimens during the acute phase of infection. The lowest concentration of SARS-CoV-2 viral copies this assay can detect is 131  copies/mL. A negative result does not preclude SARS-Cov-2 infection and should not be used as the sole basis for treatment or  other patient management decisions. A negative result may occur with  improper specimen collection/handling, submission of specimen other than nasopharyngeal swab, presence of viral mutation(s) within the areas targeted by this assay, and inadequate number of viral copies (<131 copies/mL). A negative result must be combined with clinical observations, patient history, and epidemiological information. The expected result is Negative.  Fact Sheet for Patients:  PinkCheek.be  Fact Sheet for Healthcare Providers:  GravelBags.it  This test is no t yet approved or cleared by the [place] FDA and  has been authorized for detection and/or diagnosis of SARS-CoV-2 by FDA under an Emergency Use Authorization (EUA). This EUA will remain  in effect (meaning this test can be used) for the duration of the COVID-19 declaration under Section 564(b)(1) of the Act, 21 U.S.C. section 360bbb-3(b)(1), unless the authorization is terminated or revoked sooner.     Influenza A by PCR NEGATIVE NEGATIVE Final   Influenza B by PCR NEGATIVE NEGATIVE Final    Comment: (NOTE) The Xpert Xpress SARS-CoV-2/FLU/RSV assay is intended as an aid in  the diagnosis of influenza from Nasopharyngeal swab specimens and  should not be used as a sole basis for treatment. Nasal washings and  aspirates are unacceptable for Xpert Xpress SARS-CoV-2/FLU/RSV  testing.  Fact Sheet for Patients: PinkCheek.be  Fact Sheet for Healthcare Providers: GravelBags.it  This test is not yet approved or cleared by the [place] FDA and  has been authorized for detection and/or diagnosis of SARS-CoV-2 by  FDA under an Emergency Use Authorization (EUA). This EUA will remain  in effect (meaning  this test can be used) for the duration of the  Covid-19 declaration under Section 564(b)(1) of the Act, 21  U.S.C. section 360bbb-3(b)(1), unless the authorization is  terminated or revoked. Performed at [facility], [place] [address] [place], [place] [address]      Labs: BNP (last 3 results) No results for input(s): BNP in the last 8760 hours. Basic Metabolic Panel: Recent Labs  Lab 10/07/20 0153 10/08/20 0647 10/09/20 0906 10/10/20 0056 10/11/20 0239  NA 134* 137 136 138 139  K 3.6 3.8 3.5 3.4* 4.7  CL 101 107 107 108 109  CO2 21* 19* 18* 18* 19*  GLUCOSE 132* 219* 148* 155* 134*  BUN 36* 40* 45* 45* 44*  CREATININE 2.34* 2.36* 2.85* 2.73* 2.61*  CALCIUM 7.8* 7.9* 8.0* 7.8* 7.9*  PHOS  --   --   --   --  3.9   Liver Function Tests: Recent Labs  Lab 10/06/20 1343 10/07/20 0153 10/08/20 0647 10/09/20 0906 10/11/20 0239  AST 9* 10* 13* 13*  --   ALT '8 8 7 6  ' --   ALKPHOS 95 85 81 80  --   BILITOT 0.5 0.5 0.5 0.8  --   PROT 6.7 6.2* 6.0* 6.3*  --   ALBUMIN 2.4* 2.2* 2.2* 2.3* 2.0*   No results for input(s): LIPASE, AMYLASE in the last 168 hours. No results for input(s): AMMONIA in the last 168 hours. CBC: Recent Labs  Lab 10/06/20 1343 10/07/20 0153 10/08/20 0647 10/09/20 0906 10/10/20 0056  WBC 8.4 6.3 6.2 7.6 6.7  NEUTROABS 7.1 4.5 4.8 5.0  --   HGB 8.5* 7.9* 8.5* 7.8* 8.3*  HCT 29.1* 27.2* 28.4* 26.7* 27.5*  MCV 85.1 85.5 86.3 85.0 84.4  PLT 285 253 262 274 252   Cardiac Enzymes: No results for input(s): CKTOTAL, CKMB, CKMBINDEX, TROPONINI in the last 168 hours. BNP: Invalid input(s): POCBNP CBG:  Recent Labs  Lab 10/10/20 1212 10/10/20 1619 10/10/20 2056 10/11/20 0640 10/11/20 1115  GLUCAP 101* 118* 115* 113* 101*   D-Dimer No results for input(s): DDIMER in the last 72 hours. Hgb A1c No results for input(s): HGBA1C in the last 72 hours. Lipid Profile No results for input(s): CHOL, HDL, LDLCALC, TRIG, CHOLHDL, LDLDIRECT in the  last 72 hours. Thyroid function studies No results for input(s): TSH, T4TOTAL, T3FREE, THYROIDAB in the last 72 hours.  Invalid input(s): FREET3 Anemia work up Recent Labs    10/09/20 1727 10/10/20 0056  VITAMINB12  --  180  FOLATE  --  7.1  FERRITIN 126  --   TIBC 241*  --   IRON 26*  --   RETICCTPCT 1.7  --    Urinalysis    Component Value Date/Time   COLORURINE YELLOW 10/09/2020 1443   APPEARANCEUR HAZY (A) 10/09/2020 1443   LABSPEC 1.011 10/09/2020 1443   PHURINE 5.0 10/09/2020 1443   GLUCOSEU 50 (A) 10/09/2020 1443   HGBUR MODERATE (A) 10/09/2020 1443   BILIRUBINUR NEGATIVE 10/09/2020 1443   KETONESUR NEGATIVE 10/09/2020 1443   PROTEINUR 100 (A) 10/09/2020 1443   UROBILINOGEN 0.2 06/25/2011 1707   NITRITE NEGATIVE 10/09/2020 1443   LEUKOCYTESUR TRACE (A) 10/09/2020 1443   Sepsis Labs Invalid input(s): PROCALCITONIN,  WBC,  LACTICIDVEN Microbiology Recent Results (from the past 240 hour(s))  Blood culture (routine x 2)     Status: None   Collection Time: 10/06/20  8:08 PM   Specimen: BLOOD  Result Value Ref Range Status   Specimen Description BLOOD RIGHT ANTECUBITAL  Final   Special Requests   Final    BOTTLES DRAWN AEROBIC AND ANAEROBIC Blood Culture results may not be optimal due to an inadequate volume of blood received in culture bottles   Culture   Final    NO GROWTH 5 DAYS Performed at [facility], [place] [address], [place], [place] [address]    Report Status 10/11/2020 FINAL  Final  Blood culture (routine x 2)     Status: None   Collection Time: 10/06/20  8:36 PM   Specimen: BLOOD LEFT FOREARM  Result Value Ref Range Status   Specimen Description BLOOD LEFT FOREARM  Final   Special Requests   Final    BOTTLES DRAWN AEROBIC AND ANAEROBIC Blood Culture results may not be optimal due to an inadequate volume of blood received in culture bottles   Culture   Final    NO GROWTH 5 DAYS Performed at [facility], [place] [address] [place],   [address]    Report Status 10/11/2020 FINAL  Final  Respiratory Panel by RT PCR (Flu A&B, Covid) - Nasopharyngeal Swab     Status: None   Collection Time: 10/06/20  8:50 PM   Specimen: Nasopharyngeal Swab; Nasopharyngeal(NP) swabs in vial transport medium  Result Value Ref Range Status   SARS Coronavirus 2 by RT PCR NEGATIVE NEGATIVE Final    Comment: (NOTE) SARS-CoV-2 target nucleic acids are NOT DETECTED.  The SARS-CoV-2 RNA is generally detectable in upper respiratoy specimens during the acute phase of infection. The lowest concentration of SARS-CoV-2 viral copies this assay can detect is 131 copies/mL. A negative result does not preclude SARS-Cov-2 infection and should not be used as the sole basis for treatment or other patient management decisions. A negative result may occur with  improper specimen collection/handling, submission of specimen other than nasopharyngeal swab, presence of viral mutation(s) within the areas targeted  by this assay, and inadequate number of viral copies (<131 copies/mL). A negative result must be combined with clinical observations, patient history, and epidemiological information. The expected result is Negative.  Fact Sheet for Patients:  PinkCheek.be  Fact Sheet for Healthcare Providers:  GravelBags.it  This test is no t yet approved or cleared by the [place] FDA and  has been authorized for detection and/or diagnosis of SARS-CoV-2 by FDA under an Emergency Use Authorization (EUA). This EUA will remain  in effect (meaning this test can be used) for the duration of the COVID-19 declaration under Section 564(b)(1) of the Act, 21 U.S.C. section 360bbb-3(b)(1), unless the authorization is terminated or revoked sooner.     Influenza A by PCR NEGATIVE NEGATIVE Final   Influenza B by PCR NEGATIVE NEGATIVE Final    Comment: (NOTE) The Xpert Xpress SARS-CoV-2/FLU/RSV assay is intended  as an aid in  the diagnosis of influenza from Nasopharyngeal swab specimens and  should not be used as a sole basis for treatment. Nasal washings and  aspirates are unacceptable for Xpert Xpress SARS-CoV-2/FLU/RSV  testing.  Fact Sheet for Patients: PinkCheek.be  Fact Sheet for Healthcare Providers: GravelBags.it  This test is not yet approved or cleared by the [place] FDA and  has been authorized for detection and/or diagnosis of SARS-CoV-2 by  FDA under an Emergency Use Authorization (EUA). This EUA will remain  in effect (meaning this test can be used) for the duration of the  Covid-19 declaration under Section 564(b)(1) of the Act, 21  U.S.C. section 360bbb-3(b)(1), unless the authorization is  terminated or revoked. Performed at [facility], [place] [address], [place], [place] [address]      Time coordinating discharge: Over 30 minutes  SIGNED:   Mckinley Jewel, MD  [name] 10/11/2020, 2:58 PM Pager   If 7PM-7AM, please contact night-coverage [email or web address]

## 2020-10-11 NOTE — Progress Notes (Signed)
Pt wants to go home today. Equipments avail. Pt worked with PT. Discharge instructions was given. Discharged to home accompanied by spouse.

## 2020-10-23 ENCOUNTER — Other Ambulatory Visit: Payer: Self-pay | Admitting: Internal Medicine

## 2020-11-29 ENCOUNTER — Emergency Department (HOSPITAL_COMMUNITY): Payer: Medicare Other

## 2020-11-29 ENCOUNTER — Inpatient Hospital Stay (HOSPITAL_COMMUNITY)
Admission: EM | Admit: 2020-11-29 | Discharge: 2020-12-02 | DRG: 291 | Disposition: A | Discharge status: Skilled Nursing Facility | Payer: Medicare Other | Attending: Family Medicine | Admitting: Family Medicine

## 2020-11-29 ENCOUNTER — Other Ambulatory Visit: Payer: Self-pay

## 2020-11-29 ENCOUNTER — Encounter (HOSPITAL_COMMUNITY): Payer: Self-pay | Admitting: Emergency Medicine

## 2020-11-29 DIAGNOSIS — I2510 Atherosclerotic heart disease of native coronary artery without angina pectoris: Secondary | ICD-10-CM | POA: Diagnosis present

## 2020-11-29 DIAGNOSIS — E1122 Type 2 diabetes mellitus with diabetic chronic kidney disease: Secondary | ICD-10-CM | POA: Diagnosis present

## 2020-11-29 DIAGNOSIS — E1152 Type 2 diabetes mellitus with diabetic peripheral angiopathy with gangrene: Secondary | ICD-10-CM | POA: Diagnosis not present

## 2020-11-29 DIAGNOSIS — M549 Dorsalgia, unspecified: Secondary | ICD-10-CM | POA: Diagnosis present

## 2020-11-29 DIAGNOSIS — Z794 Long term (current) use of insulin: Secondary | ICD-10-CM

## 2020-11-29 DIAGNOSIS — D631 Anemia in chronic kidney disease: Secondary | ICD-10-CM | POA: Diagnosis present

## 2020-11-29 DIAGNOSIS — E782 Mixed hyperlipidemia: Secondary | ICD-10-CM | POA: Diagnosis not present

## 2020-11-29 DIAGNOSIS — Z993 Dependence on wheelchair: Secondary | ICD-10-CM

## 2020-11-29 DIAGNOSIS — I10 Essential (primary) hypertension: Secondary | ICD-10-CM | POA: Diagnosis not present

## 2020-11-29 DIAGNOSIS — R9431 Abnormal electrocardiogram [ECG] [EKG]: Secondary | ICD-10-CM

## 2020-11-29 DIAGNOSIS — I5023 Acute on chronic systolic (congestive) heart failure: Secondary | ICD-10-CM | POA: Diagnosis not present

## 2020-11-29 DIAGNOSIS — D72819 Decreased white blood cell count, unspecified: Secondary | ICD-10-CM

## 2020-11-29 DIAGNOSIS — I739 Peripheral vascular disease, unspecified: Secondary | ICD-10-CM | POA: Diagnosis not present

## 2020-11-29 DIAGNOSIS — I255 Ischemic cardiomyopathy: Secondary | ICD-10-CM | POA: Diagnosis present

## 2020-11-29 DIAGNOSIS — I4891 Unspecified atrial fibrillation: Secondary | ICD-10-CM | POA: Diagnosis present

## 2020-11-29 DIAGNOSIS — E1151 Type 2 diabetes mellitus with diabetic peripheral angiopathy without gangrene: Secondary | ICD-10-CM | POA: Diagnosis present

## 2020-11-29 DIAGNOSIS — Z95820 Peripheral vascular angioplasty status with implants and grafts: Secondary | ICD-10-CM

## 2020-11-29 DIAGNOSIS — H547 Unspecified visual loss: Secondary | ICD-10-CM | POA: Diagnosis present

## 2020-11-29 DIAGNOSIS — N390 Urinary tract infection, site not specified: Secondary | ICD-10-CM | POA: Diagnosis present

## 2020-11-29 DIAGNOSIS — Z20822 Contact with and (suspected) exposure to covid-19: Secondary | ICD-10-CM | POA: Diagnosis present

## 2020-11-29 DIAGNOSIS — E1142 Type 2 diabetes mellitus with diabetic polyneuropathy: Secondary | ICD-10-CM | POA: Diagnosis present

## 2020-11-29 DIAGNOSIS — I7092 Chronic total occlusion of artery of the extremities: Secondary | ICD-10-CM | POA: Diagnosis present

## 2020-11-29 DIAGNOSIS — N184 Chronic kidney disease, stage 4 (severe): Secondary | ICD-10-CM | POA: Diagnosis present

## 2020-11-29 DIAGNOSIS — R7989 Other specified abnormal findings of blood chemistry: Secondary | ICD-10-CM

## 2020-11-29 DIAGNOSIS — K529 Noninfective gastroenteritis and colitis, unspecified: Secondary | ICD-10-CM | POA: Diagnosis present

## 2020-11-29 DIAGNOSIS — E785 Hyperlipidemia, unspecified: Secondary | ICD-10-CM | POA: Diagnosis present

## 2020-11-29 DIAGNOSIS — Z7901 Long term (current) use of anticoagulants: Secondary | ICD-10-CM

## 2020-11-29 DIAGNOSIS — Z89612 Acquired absence of left leg above knee: Secondary | ICD-10-CM | POA: Diagnosis not present

## 2020-11-29 DIAGNOSIS — I70202 Unspecified atherosclerosis of native arteries of extremities, left leg: Secondary | ICD-10-CM | POA: Diagnosis present

## 2020-11-29 DIAGNOSIS — F17210 Nicotine dependence, cigarettes, uncomplicated: Secondary | ICD-10-CM | POA: Diagnosis present

## 2020-11-29 DIAGNOSIS — Z89511 Acquired absence of right leg below knee: Secondary | ICD-10-CM

## 2020-11-29 DIAGNOSIS — I5043 Acute on chronic combined systolic (congestive) and diastolic (congestive) heart failure: Secondary | ICD-10-CM | POA: Diagnosis present

## 2020-11-29 DIAGNOSIS — Z83438 Family history of other disorder of lipoprotein metabolism and other lipidemia: Secondary | ICD-10-CM

## 2020-11-29 DIAGNOSIS — Z833 Family history of diabetes mellitus: Secondary | ICD-10-CM

## 2020-11-29 DIAGNOSIS — K219 Gastro-esophageal reflux disease without esophagitis: Secondary | ICD-10-CM | POA: Diagnosis present

## 2020-11-29 DIAGNOSIS — I252 Old myocardial infarction: Secondary | ICD-10-CM

## 2020-11-29 DIAGNOSIS — I2699 Other pulmonary embolism without acute cor pulmonale: Secondary | ICD-10-CM

## 2020-11-29 DIAGNOSIS — I6523 Occlusion and stenosis of bilateral carotid arteries: Secondary | ICD-10-CM | POA: Diagnosis present

## 2020-11-29 DIAGNOSIS — Z79899 Other long term (current) drug therapy: Secondary | ICD-10-CM

## 2020-11-29 DIAGNOSIS — E113532 Type 2 diabetes mellitus with proliferative diabetic retinopathy with traction retinal detachment not involving the macula, left eye: Secondary | ICD-10-CM

## 2020-11-29 DIAGNOSIS — I482 Chronic atrial fibrillation, unspecified: Secondary | ICD-10-CM | POA: Diagnosis present

## 2020-11-29 DIAGNOSIS — I5031 Acute diastolic (congestive) heart failure: Secondary | ICD-10-CM | POA: Diagnosis not present

## 2020-11-29 DIAGNOSIS — M545 Low back pain, unspecified: Secondary | ICD-10-CM | POA: Diagnosis not present

## 2020-11-29 DIAGNOSIS — R778 Other specified abnormalities of plasma proteins: Secondary | ICD-10-CM | POA: Diagnosis present

## 2020-11-29 DIAGNOSIS — N189 Chronic kidney disease, unspecified: Secondary | ICD-10-CM

## 2020-11-29 DIAGNOSIS — G8929 Other chronic pain: Secondary | ICD-10-CM | POA: Diagnosis present

## 2020-11-29 DIAGNOSIS — Z6834 Body mass index (BMI) 34.0-34.9, adult: Secondary | ICD-10-CM

## 2020-11-29 DIAGNOSIS — I130 Hypertensive heart and chronic kidney disease with heart failure and stage 1 through stage 4 chronic kidney disease, or unspecified chronic kidney disease: Secondary | ICD-10-CM | POA: Diagnosis present

## 2020-11-29 DIAGNOSIS — R531 Weakness: Secondary | ICD-10-CM | POA: Diagnosis present

## 2020-11-29 DIAGNOSIS — J9601 Acute respiratory failure with hypoxia: Secondary | ICD-10-CM

## 2020-11-29 DIAGNOSIS — I509 Heart failure, unspecified: Secondary | ICD-10-CM | POA: Diagnosis present

## 2020-11-29 DIAGNOSIS — E669 Obesity, unspecified: Secondary | ICD-10-CM | POA: Diagnosis present

## 2020-11-29 DIAGNOSIS — Z9714 Presence of artificial left leg (complete) (partial): Secondary | ICD-10-CM

## 2020-11-29 DIAGNOSIS — E119 Type 2 diabetes mellitus without complications: Secondary | ICD-10-CM

## 2020-11-29 DIAGNOSIS — Z8249 Family history of ischemic heart disease and other diseases of the circulatory system: Secondary | ICD-10-CM

## 2020-11-29 HISTORY — DX: Disorder of kidney and ureter, unspecified: N28.9

## 2020-11-29 LAB — LACTIC ACID, PLASMA
Lactic Acid, Venous: 1.5 mmol/L (ref 0.5–1.9)
Lactic Acid, Venous: 0.9 mmol/L (ref 0.5–1.9)

## 2020-11-29 LAB — CBC WITH DIFFERENTIAL/PLATELET
Abs Immature Granulocytes: 0.03 10*3/uL (ref 0.00–0.07)
Basophils Absolute: 0 10*3/uL (ref 0.0–0.1)
Basophils Relative: 1 %
Eosinophils Absolute: 0 10*3/uL (ref 0.0–0.5)
Eosinophils Relative: 1 %
HCT: 32.7 % — ABNORMAL LOW (ref 36.0–46.0)
Hemoglobin: 9.5 g/dL — ABNORMAL LOW (ref 12.0–15.0)
Immature Granulocytes: 1 %
Lymphocytes Relative: 22 %
Lymphs Abs: 0.8 10*3/uL (ref 0.7–4.0)
MCH: 26.1 pg (ref 26.0–34.0)
MCHC: 29.1 g/dL — ABNORMAL LOW (ref 30.0–36.0)
MCV: 89.8 fL (ref 80.0–100.0)
Monocytes Absolute: 0.3 10*3/uL (ref 0.1–1.0)
Monocytes Relative: 9 %
Neutro Abs: 2.4 10*3/uL (ref 1.7–7.7)
Neutrophils Relative %: 66 %
Platelets: 237 10*3/uL (ref 150–400)
RBC: 3.64 MIL/uL — ABNORMAL LOW (ref 3.87–5.11)
RDW: 18.3 % — ABNORMAL HIGH (ref 11.5–15.5)
WBC: 3.6 10*3/uL — ABNORMAL LOW (ref 4.0–10.5)
nRBC: 0 % (ref 0.0–0.2)

## 2020-11-29 LAB — COMPREHENSIVE METABOLIC PANEL
ALT: 8 U/L (ref 0–44)
AST: 15 U/L (ref 15–41)
Albumin: 2.7 g/dL — ABNORMAL LOW (ref 3.5–5.0)
Alkaline Phosphatase: 84 U/L (ref 38–126)
Anion gap: 11 (ref 5–15)
BUN: 31 mg/dL — ABNORMAL HIGH (ref 6–20)
CO2: 18 mmol/L — ABNORMAL LOW (ref 22–32)
Calcium: 7.8 mg/dL — ABNORMAL LOW (ref 8.9–10.3)
Chloride: 108 mmol/L (ref 98–111)
Creatinine, Ser: 2.1 mg/dL — ABNORMAL HIGH (ref 0.44–1.00)
GFR, Estimated: 27 mL/min — ABNORMAL LOW (ref >60)
Glucose, Bld: 79 mg/dL (ref 70–99)
Potassium: 3.5 mmol/L (ref 3.5–5.1)
Sodium: 137 mmol/L (ref 135–145)
Total Bilirubin: 0.4 mg/dL (ref 0.3–1.2)
Total Protein: 6.2 g/dL — ABNORMAL LOW (ref 6.5–8.1)

## 2020-11-29 LAB — FERRITIN: Ferritin: 84 ng/mL (ref 11–307)

## 2020-11-29 LAB — CBG MONITORING, ED: Glucose-Capillary: 113 mg/dL — ABNORMAL HIGH (ref 70–99)

## 2020-11-29 LAB — RESP PANEL BY RT-PCR (FLU A&B, COVID) ARPGX2
Influenza A by PCR: NEGATIVE
Influenza B by PCR: NEGATIVE
SARS Coronavirus 2 by RT PCR: NEGATIVE

## 2020-11-29 LAB — C-REACTIVE PROTEIN: CRP: 2.6 mg/dL — ABNORMAL HIGH (ref <1.0)

## 2020-11-29 LAB — D-DIMER, QUANTITATIVE: D-Dimer, Quant: 4.75 ug/mL-FEU — ABNORMAL HIGH (ref 0.00–0.50)

## 2020-11-29 LAB — TRIGLYCERIDES: Triglycerides: 119 mg/dL (ref <150)

## 2020-11-29 LAB — FIBRINOGEN: Fibrinogen: 387 mg/dL (ref 210–475)

## 2020-11-29 LAB — LACTATE DEHYDROGENASE: LDH: 188 U/L (ref 98–192)

## 2020-11-29 LAB — BRAIN NATRIURETIC PEPTIDE: B Natriuretic Peptide: 2629 pg/mL — ABNORMAL HIGH (ref 0.0–100.0)

## 2020-11-29 LAB — PROCALCITONIN: Procalcitonin: <0.1 ng/mL

## 2020-11-29 MED ORDER — METHYLPREDNISOLONE SODIUM SUCC 125 MG IJ SOLR
125.0000 mg | Freq: Once | INTRAMUSCULAR | Status: AC
  Administered 2020-11-29: 125 mg via INTRAVENOUS
  Filled 2020-11-29: qty 2

## 2020-11-29 MED ORDER — INSULIN ASPART 100 UNIT/ML ~~LOC~~ SOLN
0.0000 [IU] | Freq: Three times a day (TID) | SUBCUTANEOUS | Status: DC
  Administered 2020-11-30 – 2020-12-02 (×4): 1 [IU] via SUBCUTANEOUS

## 2020-11-29 MED ORDER — ENOXAPARIN SODIUM 80 MG/0.8ML ~~LOC~~ SOLN
73.0000 mg | Freq: Once | SUBCUTANEOUS | Status: AC
  Administered 2020-11-29: 73 mg via SUBCUTANEOUS
  Filled 2020-11-29: qty 0.8

## 2020-11-29 MED ORDER — FUROSEMIDE 10 MG/ML IJ SOLN
40.0000 mg | Freq: Two times a day (BID) | INTRAMUSCULAR | Status: DC
  Administered 2020-11-30 – 2020-12-02 (×4): 40 mg via INTRAVENOUS
  Filled 2020-11-29 (×4): qty 4

## 2020-11-29 MED ORDER — FUROSEMIDE 10 MG/ML IJ SOLN
80.0000 mg | Freq: Once | INTRAMUSCULAR | Status: AC
  Administered 2020-11-29: 80 mg via INTRAVENOUS
  Filled 2020-11-29: qty 8

## 2020-11-29 MED ORDER — ENOXAPARIN SODIUM 80 MG/0.8ML ~~LOC~~ SOLN: 1.0000 mg/kg | Freq: Once | SUBCUTANEOUS | Status: DC

## 2020-11-29 MED ORDER — INSULIN ASPART 100 UNIT/ML ~~LOC~~ SOLN: 0.0000 [IU] | Freq: Every day | SUBCUTANEOUS | Status: DC

## 2020-11-29 MED ORDER — PANTOPRAZOLE SODIUM 40 MG PO TBEC
40.0000 mg | DELAYED_RELEASE_TABLET | Freq: Every day | ORAL | Status: DC
  Administered 2020-11-29 – 2020-12-02 (×4): 40 mg via ORAL
  Filled 2020-11-29 (×4): qty 1

## 2020-11-29 NOTE — ED Provider Notes (Signed)
[name]   CSN: [id number] Arrival date & time: 11/29/20  1642    History Cough, SOB  [name] is a 55 y.o. female with history significant for CAD, diabetes, bilateral lower extremity amputation who presents for evaluation of 1 week of cough.  Has had shortness of breath over the last 3 days.  Does not wear oxygen at baseline.  She compliant with Lasix.  Per patient they have been changing the dosage due to her kidney function.  She is compliant with her anticoagulation for her A. fib.  Cough nonproductive.  Denies headache, lightness, dizziness, chest pain, abdominal pain, dysuria.  Has chronic diarrhea at baseline.  No melena or bright blood per rectum.  Wheelchair-bound due to bilateral lower extremity amputations.  Denies additional aggravating or alleviating factors.   COVID vaccinated. No booster  Sick husband  History obtained from patient and past medical records.  No interpreter used    HPI     Past Medical History:  Diagnosis Date  . Abnormal stress test    a. 02/2017 MV: large region of fixed perfusion defect in basal to mid inf, mid-dist inflat walls, EF 43%. No ischemia (EF 55-60% by f/u echo).  . Arthritis   . Asthma   . Carotid arterial disease ([place])    a. 09/2017 Carotid U/S: 40-49% bilat ICA stenosis.  . Chronic back pain   . Coronary artery calcification seen on CT scan    a. 11/2017 CT Abd/Pelvis: Multi vessel coronary vascular Ca2+.  . Depression   . Diabetes mellitus   . Diabetic neuropathy ([place])   . Difficult intubation    DIFFICULT AIRWAY/FYI  . Family history of adverse reaction to anesthesia    mother had difficlty waking   . Femoral-popliteal bypass graft occlusion, left ([place]) 12/02/2017  . GERD (gastroesophageal reflux disease)   . History of echocardiogram    a. 03/2017 Echo: EF 55-60%, mild LVH, nl RV fxn.  . Hyperlipidemia   . Ischemic cardiomyopathy    a. 04/2018 Echo: EF 30-35%, Gr2 DD, mild LVH.  Mild MR. Mildly dil LA, mod dil RV w/ mod red RV fxn, mild TR, PASP 74mHg.  . NSTEMI (non-ST elevated myocardial infarction) ([place]    a. 05/2018 in setting of Afib, sepsis, and post-op L AKA. Peak trop 7.7. EF 30-35% by echo-->cath not performed 2/2 renal failure.  . Osteomyelitis of right fibula ([facility] 03/05/2017  . PAD (peripheral artery disease) ([place]uck[place]    a. S/p L fem-pop bypass; b. 11/2017 s/p Aortobifem bypass 2/2 graft occlusion; c. 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  . Paroxysmal atrial fibrillation with rapid ventricular response (HDiagonal 12/02/2017   a. CHA2DS2VASc = 3-->Xarelto; b. 05/2018 Recurrent Afib-->amio.  .Marland KitchenUlcer    Foot    Patient Active Problem List   Diagnosis Date Noted  . Acute exacerbation of CHF (congestive heart failure) (HSpalding 11/29/2020  . Foot osteomyelitis, right (HPaonia 10/06/2020  . Stable treated proliferative diabetic retinopathy of left eye determined by examination associated with type 2 diabetes mellitus (HPheasant Run 08/14/2020  . Controlled type 2 diabetes mellitus with left eye affected by proliferative retinopathy with traction retinal detachment not involving macula, with long-term current use of insulin (HDownsville 02/24/2020  . Macular pucker, left eye 02/24/2020  . Follow-up examination after eye surgery 02/24/2020  . Coronary artery disease involving native coronary artery of native heart without angina pectoris 11/21/2018  . Ischemic cardiomyopathy 11/21/2018  .  Chronic systolic heart failure ([place]) 11/21/2018  . Persistent atrial fibrillation ([place]) 11/21/2018  . Chronic kidney disease, stage III (moderate) ([place]) 11/21/2018  . Essential hypertension 11/21/2018  . Abnormality of gait 07/29/2018  . Neuropathic pain   . Labile blood pressure   . Diabetic retinopathy associated with type 2 diabetes mellitus ([place])   . Hypoalbuminemia due to protein-calorie malnutrition ([place])   . Type 2 diabetes mellitus with peripheral neuropathy  (HCC)   . Chronic diastolic congestive heart failure ([place])   . Above knee amputation status, left 05/22/2018  . AKI (acute kidney injury) ([place])   . Unilateral AKA, left ([place])   . ATN (acute tubular necrosis) ([place])   . Atrial fibrillation with rapid ventricular response ([place])   . Diabetic peripheral neuropathy ([place])   . Acute blood loss anemia   . Anemia of chronic disease   . Hyperlipidemia   . Gastroesophageal reflux disease   . Non-ST elevation (NSTEMI) myocardial infarction ([place])   . Acute combined systolic and diastolic heart failure ([place])   . Acute encephalopathy   . Acute renal failure ([place])   . Acute respiratory failure ([place])   . Sepsis ([place])   . Lower limb ischemia 04/25/2018  . Microcytic anemia 04/25/2018  . Pulmonary nodule 04/25/2018  . Gangrene of lower extremity ([place]) 04/25/2018  . Femoral-popliteal bypass graft occlusion, left ([place]) 12/02/2017  . Respiratory failure with hypoxia ([place]) 12/02/2017  . Leukocytosis 12/02/2017  . Paroxysmal atrial fibrillation with rapid ventricular response ([place]) 12/02/2017  . Thrombosis 11/24/2017  . Hyperkalemia 06/26/2017  . Atherosclerosis of native artery of right lower extremity with ulceration of ankle ([place]) 05/13/2017  . Osteomyelitis of right fibula ([place]) 03/05/2017  . Non-pressure chronic ulcer of right ankle limited to breakdown of skin ([place]) 01/17/2017  . Elevated lactic acid level   . Osteomyelitis of ankle ([place]) 12/29/2016  . Chronic pain 12/24/2016  . Buttock wound, left, subsequent encounter   . Hypotension 12/23/2016  . IDDM (insulin dependent diabetes mellitus) 12/23/2016  . Acute kidney injury ([place]) 12/23/2016  . Malnutrition ([place]) 12/23/2016  . Thrush 12/23/2016  . Septic shock ([place]) 11/27/2016  . DM2 (diabetes mellitus, type 2) ([place]) 11/27/2016  . Chronic back pain 11/27/2016  . Orthostatic hypotension 11/27/2016  . Demand ischemia ([place])   . Hyperlipidemia LDL goal <70   . Right sided weakness   . Retinal  tumor of right eye   . Pressure injury of skin 10/01/2016  . Necrotizing fasciitis ([place]) 09/30/2016  . 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 Paresthesia of both hands 03/05/2016  . DDD (degenerative disc disease), lumbosacral 03/26/2015  . DDD (degenerative disc disease), cervical 03/26/2015  . Sacroiliac joint disease 03/26/2015  . Occipital neuralgia 03/26/2015  . Pain in limb-Bilateral Leg 04/25/2014  . Peripheral vascular disease ([place]) 04/05/2013  . Chronic total occlusion of artery of the extremities ([place]) 02/10/2012  . Peripheral vascular disease, unspecified ([place]) 02/10/2012    Past Surgical History:  Procedure Laterality Date  . ABDOMINAL AORTAGRAM  June 15, 2014  . ABDOMINAL AORTAGRAM N/A 06/15/2014   Procedure: ABDOMINAL Maxcine Ham;  Surgeon: Serafina Mitchell, MD;  Location[facility] CATH LAB;  Service: Cardiovascular;  Laterality: N/A;  . ABDOMINAL AORTAGRAM N/A 11/22/2014   Procedure: ABDOMINAL AORTAGRAM;  Surgeon: Serafina Mitchell, MD;  Location: [facility] CATH LAB;  Service: Cardiovascular;  Laterality: N/A;  . ABDOMINAL AORTOGRAM W/LOWER EXTREMITY N/A 01/07/2017   Procedure: Abdominal Aortogram w/Lower Extremity;  Surgeon: Serafina Mitchell, MD;  Location:  [facility] INVASIVE CV LAB;  Service: Cardiovascular;  Laterality: N/A;  . ABDOMINAL AORTOGRAM W/LOWER EXTREMITY N/A 10/31/2017   Procedure: ABDOMINAL AORTOGRAM W/LOWER EXTREMITY;  Surgeon: Elam Dutch, MD;  Location: [place] CV LAB;  Service: Cardiovascular;  Laterality: N/A;  . ABDOMINAL AORTOGRAM W/LOWER EXTREMITY N/A 03/24/2018   Procedure: ABDOMINAL AORTOGRAM W/LOWER EXTREMITY;  Surgeon: Serafina Mitchell, MD;  Location: [place] CV LAB;  Service: Cardiovascular;  Laterality: N/A;  . AMPUTATION Left 04/26/2018   Procedure: AMPUTATION ABOVE KNEE;  Surgeon: Elam Dutch, MD;  Location: [place];  Service: Vascular;  Laterality: Left;  . AORTA - BILATERAL FEMORAL ARTERY BYPASS GRAFT N/A 11/28/2017   Procedure: AORTA  BIFEMORAL BYPASS USING HEMASHIELD GOLD GRAFT & REIMPLANT IMA;  Surgeon: Serafina Mitchell, MD;  Location: [facility] OR;  Service: Vascular;  Laterality: N/A;  . AORTIC ARCH ANGIOGRAPHY N/A 10/31/2017   Procedure: AORTIC ARCH ANGIOGRAPHY;  Surgeon: Elam Dutch, MD;  Location: [place] CV LAB;  Service: Cardiovascular;  Laterality: N/A;  . APPLICATION OF WOUND VAC  11/28/2017   Procedure: APPLICATION OF WOUND VAC;  Surgeon: Serafina Mitchell, MD;  Location: [facility] OR;  Service: Vascular;;  . APPLICATION OF WOUND VAC Left 03/27/2018   Procedure: APPLICATION OF WOUND VAC LEFT GROIN USING PREVENA PLUS;  Surgeon: Serafina Mitchell, MD;  Location: [place];  Service: Vascular;  Laterality: Left;  . ARTERIAL BYPASS SURGERY   07/05/2010   Right Common Femoral to below knee popliteal BPG  . BACK SURGERY     X's  2  . CARDIAC CATHETERIZATION    . CHOLECYSTECTOMY     Gall Bladder  . CYSTECTOMY Left    wrist  . EMBOLECTOMY Left 11/28/2017   Procedure: Left Lower Extremity Embolectomy, Left Tibial Peroneal Trunk Endarterectomy with Patch Angioplasty; Vein Harvest Small Saphenous Graft Left Lower Leg;  Surgeon: [name], MD;  Location: [place];  Service: Vascular;  Laterality: Left;  . EYE SURGERY Left 02/23/2020   Dr. Zadie Rhine  . FEMORAL-POPLITEAL BYPASS GRAFT Left 03/27/2018   Procedure: THROMBECTOMY OF LEFT FEMORAL TIBIAL BYPASS;  Surgeon: Serafina Mitchell, MD;  Location: [place];  Service: Vascular;  Laterality: Left;  . FEMORAL-TIBIAL BYPASS GRAFT Left 03/27/2018   Procedure: BYPASS GRAFT FEMORAL-TIBIAL ARTERY LEFT REDO USING CRYOPRESERVED SAPHENOUS VEIN 70cm;  Surgeon: Serafina Mitchell, MD;  Location: [facility] OR;  Service: Vascular;  Laterality: Left;  . INTERCOSTAL NERVE BLOCK  November 2015  . INTRAOPERATIVE ARTERIOGRAM  11/28/2017   Procedure: INTRA OPERATIVE ARTERIOGRAM OF LEFT LEG;  Surgeon: Serafina Mitchell, MD;  Location: [place];  Service: Vascular;;  . INTRAOPERATIVE ARTERIOGRAM Left 03/27/2018    Procedure: INTRA OPERATIVE ARTERIOGRAM TIMES TWO;  Surgeon: Serafina Mitchell, MD;  Location: [place];  Service: Vascular;  Laterality: Left;  . IR FLUORO GUIDE CV LINE RIGHT  03/20/2017  . IR FLUORO GUIDE CV LINE RIGHT  05/05/2018  . IR REMOVAL TUN CV CATH W/O FL  05/20/2018  . IR US GUIDE VASC ACCESS RIGHT  03/20/2017  . IR US GUIDE VASC ACCESS RIGHT  05/05/2018  . IRRIGATION AND DEBRIDEMENT BUTTOCKS Right 09/30/2016   Procedure: DEBRIDEMENT RIGHT  BUTTOCK WOUND;  Surgeon: Georganna Skeans, MD;  Location: [place];  Service: General;  Laterality: Right;  . left foot surgery    . PERIPHERAL VASCULAR CATHETERIZATION N/A 05/07/2016   Procedure: Abdominal Aortogram;  Surgeon: Serafina Mitchell, MD;  Location: [place] CV LAB;  Service: Cardiovascular;  Laterality: N/A;  .  PERIPHERAL VASCULAR CATHETERIZATION N/A 05/07/2016   Procedure: Lower Extremity Angiography;  Surgeon: Serafina Mitchell, MD;  Location: [place] CV LAB;  Service: Cardiovascular;  Laterality: N/A;  . PERIPHERAL VASCULAR CATHETERIZATION N/A 05/07/2016   Procedure: Aortic Arch Angiography;  Surgeon: Serafina Mitchell, MD;  Location: [place] CV LAB;  Service: Cardiovascular;  Laterality: N/A;  . PERIPHERAL VASCULAR CATHETERIZATION N/A 05/07/2016   Procedure: Upper Extremity Angiography;  Surgeon: Serafina Mitchell, MD;  Location: [place] CV LAB;  Service: Cardiovascular;  Laterality: N/A;  . PERIPHERAL VASCULAR CATHETERIZATION Right 05/07/2016   Procedure: Peripheral Vascular Balloon Angioplasty;  Surgeon: Serafina Mitchell, MD;  Location: [place] CV LAB;  Service: Cardiovascular;  Laterality: Right;  subclavian  . PERIPHERAL VASCULAR CATHETERIZATION Right 05/07/2016   Procedure: Peripheral Vascular Intervention;  Surgeon: Serafina Mitchell, MD;  Location: [place] CV LAB;  Service: Cardiovascular;  Laterality: Right;  External  Iliac  . SKIN GRAFT Right 2012   [name] by Dr. Nils Pyle- Right and Left Ankle  . SPINE SURGERY    . THROMBECTOMY  FEMORAL ARTERY  11/28/2017   Procedure: THROMBECTOMY  & REVISION OF BILATERAL FEMORAL TO POPLETEAL ARTERIES;  Surgeon: Serafina Mitchell, MD;  Location: [place];  Service: Vascular;;  . TONSILLECTOMY    . TRANSMETATARSAL AMPUTATION Right 10/07/2020   Procedure: RIGHT BELOW KNEE AMPUTATION;  Surgeon: Erle Crocker, MD;  Location: [place];  Service: Orthopedics;  Laterality: Right;     OB History   No obstetric history on file.     Family History  Problem Relation Age of Onset  . Coronary artery disease Mother   . Peripheral vascular disease Mother   . Heart disease Mother        Before age 28  . Other Mother        Venous insuffiency  . Diabetes Mother   . Hyperlipidemia Mother   . Hypertension Mother   . Varicose Veins Mother   . Heart attack Mother        before age 67  . Heart disease Father   . Diabetes Father   . Diabetes Maternal Grandmother   . Diabetes Paternal Grandmother   . Diabetes Paternal Grandfather   . Diabetes Sister   . Hypertension Sister   . Diabetes Brother   . Hypertension Brother     Social History   Tobacco Use  . Smoking status: Current Some Day Smoker    Packs/day: 1.00    Years: 30.00    Pack years: 30.00    Types: Cigarettes    Last attempt to quit: 02/07/2017    Years since quitting: 3.8  . Smokeless tobacco: Never Used  Vaping Use  . Vaping Use: Never used  Substance Use Topics  . Alcohol use: No    Alcohol/week: 0.0 standard drinks  . Drug use: No    Home Medications Prior to Admission medications   Medication Sig Start Date End Date Taking? Authorizing Provider  albuterol (PROVENTIL) 2 MG tablet Take 2 mg by mouth daily as needed for shortness of breath.     [provider]  amitriptyline (ELAVIL) 25 MG tablet Take 1 tablet (25 mg total) by mouth at bedtime. 10/11/20   Pahwani, Michell Heinrich, MD  dicyclomine (BENTYL) 10 MG capsule Take 10 mg by mouth 4 (four) times daily as needed for spasms.    [provider]   furosemide (LASIX) 20 MG tablet Take 2 tablets (40 mg total) by mouth 2 (  two) times a week. 10/12/20 10/12/21  Pahwani, Michell Heinrich, MD  furosemide (LASIX) 40 MG tablet Take 40 mg by mouth daily. 11/23/20   [provider]  HYDROcodone-acetaminophen (NORCO) 10-325 MG tablet Take 1 tablet by mouth every 2 (two) hours as needed (pain).  07/14/18   [provider]  insulin glargine (LANTUS) 100 UNIT/ML injection Inject 0.18 mLs (18 Units total) into the skin 2 (two) times daily. Patient taking differently: Inject 30 Units into the skin 2 (two) times daily.  06/03/18   Love, Ivan Anchors, PA-C  insulin lispro (HUMALOG) 100 UNIT/ML injection For glucose 121-150 use TWO units,  for 151-200 use FOUR units, for 201-250 use SIX units, for 251-300 use EIGHT units, for 301-350 use TEN units. For blood sugars greater than 350 call MD. Patient taking differently: Inject 10 Units into the skin 3 (three) times daily with meals. For glucose 121-150 use TWO units,  for 151-200 use FOUR units, for 201-250 use SIX units, for 251-300 use EIGHT units, for 301-350 use TEN units. For blood sugars greater than 350 call MD. 06/03/18   Love, Ivan Anchors, PA-C  LANTUS SOLOSTAR 100 UNIT/ML Solostar Pen Inject into the skin. 11/23/20   [provider]  LYRICA 50 MG capsule Take 1 capsule by mouth twice daily 10/23/20   Fayrene Helper, MD  metoprolol tartrate (LOPRESSOR) 25 MG tablet Take 0.5 tablets (12.5 mg total) by mouth 2 (two) times daily. Patient not taking: Reported on 10/06/2020 07/21/18   Theora Gianotti, NP  naloxone [facility]) 4 MG/0.1ML LIQD nasal spray kit Place 1 spray into the nose once as needed (opioid overdose).    [provider]  omeprazole (PRILOSEC) 40 MG capsule Take 40 mg by mouth daily.     [provider]  omeprazole (PRILOSEC) 40 MG capsule Take 40 mg by mouth daily. 05/15/20   [provider]  oxyCODONE (OXY IR/ROXICODONE) 5 MG immediate release tablet Take  1-2 tablets (5-10 mg total) by mouth every 6 (six) hours as needed for moderate pain, severe pain or breakthrough pain. 10/11/20   Pahwani, Michell Heinrich, MD  Rivaroxaban (XARELTO) 15 MG TABS tablet Take 1 tablet (15 mg total) by mouth daily with supper. 10/11/20   Pahwani, Michell Heinrich, MD  rosuvastatin (CRESTOR) 20 MG tablet Take 1 tablet (20 mg total) by mouth daily at 6 PM. Patient not taking: Reported on 10/06/2020 07/23/19   Theora Gianotti, NP  sodium bicarbonate 650 MG tablet Take 1 tablet (650 mg total) by mouth 2 (two) times daily. 10/11/20   Pahwani, Michell Heinrich, MD  vitamin B-12 1000 MCG tablet Take 1 tablet (1,000 mcg total) by mouth daily. 10/12/20   Pahwani, Michell Heinrich, MD    Allergies    Lactose intolerance (gi)  Review of Systems   Review of Systems  Constitutional: Positive for activity change.  HENT: Negative.   Respiratory: Positive for cough and shortness of breath. Negative for apnea, choking, chest tightness, wheezing and stridor.   Cardiovascular: Positive for leg swelling.  Genitourinary: Negative.   Musculoskeletal: Negative.   Skin: Negative.   Neurological: Negative.   All other systems reviewed and are negative.   Physical Exam Updated Vital Signs BP 105/64   Pulse 75   Temp 98.2 F (36.8 C) (Oral)   Resp 16   Ht _0  (1.702 m)   Wt 72.6 kg   SpO2 100%   BMI 25.07 kg/m   Physical Exam Vitals and nursing [name] reviewed.  Constitutional:      General: She is not in acute distress.    Appearance: She is well-developed and well-nourished. She is obese. She is ill-appearing (Chronically ill appearing).  HENT:     Head: Atraumatic.  Eyes:     Pupils: Pupils are equal, round, and reactive to light.  Cardiovascular:     Rate and Rhythm: Normal rate.     Pulses: Normal pulses and intact distal pulses.     Heart sounds: Normal heart sounds.  Pulmonary:     Effort: Tachypnea and respiratory distress present.     Breath sounds: Wheezing and rhonchi present.   Abdominal:     General: Bowel sounds are normal. There is no distension.     Palpations: Abdomen is soft.     Comments: Abd wall edema, no erythema, warmth  Musculoskeletal:        General: Normal range of motion.     Cervical back: Normal range of motion.     Right lower leg: Edema present.     Left lower leg: Edema present.     Comments: Right BKA, left AKA. 3 + pitting edema  Skin:    General: Skin is warm and dry.  Neurological:     General: No focal deficit present.     Mental Status: She is alert and oriented to person, place, and time.  Psychiatric:        Mood and Affect: Mood and affect normal.    ED Results / Procedures / Treatments   Labs (all labs ordered are listed, but only abnormal results are displayed) Labs Reviewed  CBC WITH DIFFERENTIAL/PLATELET - Abnormal; Notable for the following components:      Result Value   WBC 3.6 (*)    RBC 3.64 (*)    Hemoglobin 9.5 (*)    HCT 32.7 (*)    MCHC 29.1 (*)    RDW 18.3 (*)    All other components within normal limits  COMPREHENSIVE METABOLIC PANEL - Abnormal; Notable for the following components:   CO2 18 (*)    BUN 31 (*)    Creatinine, Ser 2.10 (*)    Calcium 7.8 (*)    Total Protein 6.2 (*)    Albumin 2.7 (*)    GFR, Estimated 27 (*)    All other components within normal limits  D-DIMER, QUANTITATIVE (NOT AT [facility]) - Abnormal; Notable for the following components:   D-Dimer, Quant 4.75 (*)    All other components within normal limits  C-REACTIVE PROTEIN - Abnormal; Notable for the following components:   CRP 2.6 (*)    All other components within normal limits  BRAIN NATRIURETIC PEPTIDE - Abnormal; Notable for the following components:   B Natriuretic Peptide 2,629.0 (*)    All other components within normal limits  CULTURE, BLOOD (ROUTINE X 2)  RESP PANEL BY RT-PCR (FLU A&B, COVID) ARPGX2  CULTURE, BLOOD (ROUTINE X 2)  LACTIC ACID, PLASMA  PROCALCITONIN  LACTATE DEHYDROGENASE  FERRITIN   TRIGLYCERIDES  FIBRINOGEN  LACTIC ACID, PLASMA  COMPREHENSIVE METABOLIC PANEL  CBC  PROTIME-INR  APTT  MAGNESIUM  PHOSPHORUS  POC URINE PREG, ED    EKG EKG Interpretation  Date/Time:  Wednesday November 29 2020 17:22:51 EST Ventricular Rate:  69 PR Interval:    QRS Duration: 100 QT Interval:  542 QTC Calculation: 581 R Axis:   83 Text Interpretation: Sinus rhythm Probable anterior infarct, age indeterminate Prolonged QT interval since last tracing no significant change Confirmed by  [name] ([id number]) on 11/29/2020 5:29:17 PM   Radiology DG Chest Port 1 View  Result Date: 11/29/2020 CLINICAL DATA:  55 year female with shortness of breath. EXAM: PORTABLE CHEST 1 VIEW COMPARISON:  Chest radiograph dated 05/02/2018 FINDINGS: Dated there is cardiomegaly with mild vascular congestion. Left lung base linear atelectasis/scarring. No focal consolidation, pleural effusion or pneumothorax. No acute osseous pathology. IMPRESSION: Cardiomegaly with mild vascular congestion. No focal consolidation. Electronically Signed   By: Anner Crete M.D.   On: 11/29/2020 17:50    Procedures .Critical Care Performed by: Nettie Elm, PA-C Authorized by: Nettie Elm, PA-C   Critical care provider statement:    Critical care time (minutes):  45   Critical care was necessary to treat or prevent imminent or life-threatening deterioration of the following conditions:  Cardiac failure, circulatory failure and respiratory failure   Critical care was time spent personally by me on the following activities:  Discussions with consultants, evaluation of patient's response to treatment, examination of patient, ordering and performing treatments and interventions, ordering and review of laboratory studies, ordering and review of radiographic studies, pulse oximetry, re-evaluation of patient's condition, obtaining history from patient or surrogate and review of old charts   (including  critical care time)  Medications Ordered in ED Medications  methylPREDNISolone sodium succinate (SOLU-MEDROL) 125 mg/2 mL injection 125 mg (125 mg Intravenous Given 11/29/20 1800)  furosemide (LASIX) injection 80 mg (80 mg Intravenous Given 11/29/20 1919)   ED Course  I have reviewed the triage vital signs and the nursing notes.  Pertinent labs & imaging results that were available during my care of the patient were reviewed by me and considered in my medical decision making (see chart for details).  Last EF 50-57% 2851  55 year old chronically ill-appearing presents for evaluation of cough and shortness of breath.  Worsening over the last week.  Sick husband.  Vaccinated against [place].  No booster.  On arrival patient hypoxic to 74% on room air. Placed on 5 L [place]. Does not wear oxygen at baseline.  She does appear significantly fluid overloaded with pitting edema to abdomen.  She does not appear very compliant with her Lasix at home.  She denies any chest pain, hemoptysis we will plan on labs, imaging and reassess  Labs and imaging personally reviewed and interpreted:  CBC leukopenia 3.6, hemoglobin 9.5 at baseline CMP CO2 18, creatinine 2.1, at baseline, calcium 7.8 Lactic 1.5 BC pending BNP 2629 DG chest with cardiomegally, vascular congestion COVID negative  Patient reassessed. Able to be titrated down to 3 L via nasal cannula. Discussed likely CHF exacerbation. Will give Lasix, admit for further management.  Low suspicion for acute bacterial infection, ACS, PE, dissection as cause of her hypoxia.  Patient critically ill with acute hypoxic respiratory failure.  Will need admission for further management.  CONSULT with Dr. Josephine Cables who agrees to evaluate patient for admission  The patient appears reasonably stabilized for admission considering the current resources, flow, and capabilities available in the ED at this time, and I doubt any other [facility] requiring further screening and/or  treatment in the ED prior to admission.  Patient seen eval by attending, Dr. Sabra Heck who agrees with above treatment, plan and disposition.     MDM Rules/Calculators/A&P                          [name] was evaluated in Emergency Department on 11/29/2020 for the symptoms  described in the history of present illness. She was evaluated in the context of the global COVID-19 pandemic, which necessitated consideration that the patient might be at risk for infection with the SARS-CoV-2 virus that causes COVID-19. Institutional protocols and algorithms that pertain to the evaluation of patients at risk for COVID-19 are in a state of rapid change based on information released by regulatory bodies including the CDC and federal and state organizations. These policies and algorithms were followed during the patient's care in the ED. Final Clinical Impression(s) / ED Diagnoses Final diagnoses:  Acute on chronic congestive heart failure, unspecified heart failure type ([place])  Elevated d-dimer  Acute respiratory failure with hypoxia [facility])    Rx / DC Orders ED Discharge Orders    None       Sanjith Siwek A, PA-C 11/29/20 1958    [name], MD 11/30/20 2119

## 2020-11-29 NOTE — Progress Notes (Addendum)
ANTICOAGULATION CONSULT NOTE - Initial Consult  Pharmacy Consult for  Lovenox x 1 dose Indication: history of Atrial fibrillation   Allergies  Allergen Reactions  . Lactose Intolerance (Gi) Diarrhea    Patient Measurements: Height: 5\' 7"  (170.2 cm) Weight: 72.6 kg (160 lb 0.9 oz) IBW/kg (Calculated) : 61.6   Vital Signs: Temp: 98.2 F (36.8 C) (01/12 1726) Temp Source: Oral (01/12 1726) BP: 105/64 (01/12 1900) Pulse Rate: 80 (01/12 2145)  Labs: Recent Labs    11/29/20 1730  HGB 9.5*  HCT 32.7*  PLT 237  CREATININE 2.10*    Estimated Creatinine Clearance: 29.1 mL/min (A) (by C-G formula based on SCr of 2.1 mg/dL (H)).   Medical History: Past Medical History:  Diagnosis Date  . Abnormal stress test    a. 02/2017 MV: large region of fixed perfusion defect in basal to mid inf, mid-dist inflat walls, EF 43%. No ischemia (EF 55-60% by f/u echo).  . Arthritis   . Asthma   . Carotid arterial disease ([place])    a. 09/2017 Carotid U/S: 40-49% bilat ICA stenosis.  . Chronic back pain   . Coronary artery calcification seen on CT scan    a. 11/2017 CT Abd/Pelvis: Multi vessel coronary vascular Ca2+.  . Depression   . Diabetes mellitus   . Diabetic neuropathy ([place])   . Difficult intubation    DIFFICULT AIRWAY/FYI  . Family history of adverse reaction to anesthesia    mother had difficlty waking   . 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 GERD (gastroesophageal reflux disease)   . History of echocardiogram    a. 03/2017 Echo: EF 55-60%, mild LVH, nl RV fxn.  . Hyperlipidemia   . Ischemic cardiomyopathy    a. 04/2018 Echo: EF 30-35%, Gr2 DD, mild LVH. Mild MR. Mildly dil LA, mod dil RV w/ mod red RV fxn, mild TR, PASP 20mmHg.  . NSTEMI (non-ST elevated myocardial infarction) ([place])    a. 05/2018 in setting of Afib, sepsis, and post-op L AKA. Peak trop 7.7. EF 30-35% by echo-->cath not performed 2/2 renal failure.  . 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 PAD (peripheral artery disease) ([place])    a. S/p L fem-pop bypass; b. 11/2017 s/p Aortobifem bypass 2/2 graft occlusion; c. 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  . Paroxysmal atrial fibrillation with rapid ventricular response ([place]) 12/02/2017   a. CHA2DS2VASc = 3-->Xarelto; b. 05/2018 Recurrent Afib-->amio.  Marland Kitchen Ulcer    Foot    Assessment: 55 y.o female who presents to ED for evaluation of 1 week of cough She was on Xarelto 15 mg daily prior to admission for history of Atrial fibrillation.  MD noted patient compliant with her anticoagulation for her A. fib.  Although spouse reported to medication history pharmacy technician that patient wasnot taking. Pharmacy consulted to give one dose of Lovenox for atrial fibrillation.  SCr = 2.1,  CrCl is <30 ml/min.    Hgb 9.5, PLTC 237 D-dimer =4.75   Goal of Therapy:  Monitor platelets by anticoagulation protocol: Yes   Plan:  Lovenox 73 mg SQ x 1 dose tonight. (1mg /kg/dose x1 ) Follow up for anticoagulation plan .    Nicole Cella, RPh Clinical Pharmacist  11/29/2020,10:04 PM

## 2020-11-29 NOTE — ED Provider Notes (Signed)
Medical screening examination/treatment/procedure(s) were conducted as a shared visit with non-physician practitioner(s) and myself.  I personally evaluated the patient during the encounter.  Clinical Impression:   Final diagnoses:  Acute on chronic congestive heart failure, unspecified heart failure type (H[facility])  Elevated d-dimer  Acute respiratory failure with hypoxia [facility])    This patient is an ill-appearing 55 year old female, she has a known history of diffuse vasculopathy, she has had bilateral lower extremity amputations including her left above-the-knee and her right below the knee.  On my exam the patient has anasarca palpated to approximately the costal margin bilaterally in the abdomen, her legs are significantly edematous, she has rhonchi and some subtle rales bilaterally with tachypnea and hypoxia.  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  She is requiring supplemental oxygen, x-ray pending, EKG unremarkable showing diffusely low voltage, check labs, rule out COVID or infectious process, may be pulmonary edema and fluid overload.  The patient is not terribly compliant with her diuretic medications.  [name] was evaluated in Emergency Department on 11/30/2020 for the symptoms described in the history of present illness. She was evaluated in the context of the global COVID-19 pandemic, which necessitated consideration that the patient might be at risk for infection with the SARS-CoV-2 virus that causes COVID-19. Institutional protocols and algorithms that pertain to the evaluation of patients at risk for COVID-19 are in a state of rapid change based on information released by regulatory bodies including the CDC and federal and state organizations. These policies and algorithms were followed during the patient's care in the ED.    [name], MD 11/30/20 2119

## 2020-11-29 NOTE — ED Triage Notes (Signed)
Pt reports SHOB. Pt O2 74% in triage. Pt placed on 5L O2 100% at this time.

## 2020-11-29 NOTE — H&P (Signed)
History and Physical  [name] WNI:[id number] DOB: 1966-07-29 DOA: 11/29/2020  Referring physician: Nettie Elm, PA-C PCP: Frazier Richards, MD  Patient coming from: Home  Chief Complaint: Shortness of breath  HPI: [name] is a 55 y.o. female with medical history significant for  type 2 diabetes, diabetic polyneuropathy, GERD, hyperlipidemia, paroxysmal A. fib, ischemic cardiomyopathy, peripheral vascular disease post left above-the-knee amputation and Right BKA who presents to the emergency department via EMS accompanied by her husband due to 1 week of cough (occasionally productive with clear sputum), chest congestion, nasal congestion and 3-day onset of shortness of breath.  Patient takes Lasix at baseline and she states that the dosage was being changed (recently reduced to 20 Mg from 80 mg due to worsening kidney function), otherwise she states that she has been compliant with the Lasix.  She also complains of increased abdominal girth which she states was possibly due to fluid accommodation.  Home PT checked O2 sat today and was in the high 70s, this was re- checked by husband and was noted to be in the 37s, EMS was activated, and patient was sent to the ED for further evaluation.  She denies orthopnea, chest pain, abdominal pain, fever, chills, nausea or vomiting.   Patient was supposed to follow-up with a nephrologist regarding her kidney status, however, she is yet to have an appointment with a nephrologist. She is wheelchair-bound due to bilateral lower extremity amputation.  ED Course:  In the emergency department, she was noted to be hypoxic with an O2 sat of 74%, this improved to 100% on supplemental oxygen via [place] at 2 LPM.  Work-up in the ED showed leukopenia, BUN/creatinine 31/2.1 (baseline creatinine 2.3-2.7).  BNP 2,629, D-dimer 4.75, albumin 2.7.  SARS coronavirus 2 was negative. Chest x-ray showed cardiomegaly with mild vascular congestion with no focal  consolidation IV Lasix 80 mg and was given.  IV Solu-Medrol 25 Mg x1 was given.  Hospitalist was asked to admit patient for further evaluation and management.  Review of Systems: Constitutional: Negative for chills and fever.  HENT: Positive for nasal congestion.  Legally blind.  Negative for ear pain and sore throat.   Eyes: Negative for pain and visual disturbance.  Respiratory: Positive for cough, chest congestion and shortness of breath.   Cardiovascular: Negative for chest pain and palpitations.  Gastrointestinal: Positive for increased abdominal girth.  Negative for abdominal pain and vomiting.  Endocrine: Negative for polyphagia and polyuria.  Genitourinary: Negative for decreased urine volume, dysuria, enuresis Musculoskeletal: Negative for arthralgias and back pain.  Skin: Negative for color change and rash.  Allergic/Immunologic: Negative for immunocompromised state.  Neurological: Negative for tremors, syncope, speech difficulty, weakness, light-headedness and headaches.  Hematological: Does not bruise/bleed easily.  All other systems reviewed and are negative   Past Medical History:  Diagnosis Date  . Abnormal stress test    a. 02/2017 MV: large region of fixed perfusion defect in basal to mid inf, mid-dist inflat walls, EF 43%. No ischemia (EF 55-60% by f/u echo).  . Arthritis   . Asthma   . Carotid arterial disease ([place])    a. 09/2017 Carotid U/S: 40-49% bilat ICA stenosis.  . Chronic back pain   . Coronary artery calcification seen on CT scan    a. 11/2017 CT Abd/Pelvis: Multi vessel coronary vascular Ca2+.  . Depression   . Diabetes mellitus   . Diabetic neuropathy ([place])   . Difficult intubation    DIFFICULT AIRWAY/FYI  .  Family history of adverse reaction to anesthesia    mother had difficlty waking   . Femoral-popliteal bypass graft occlusion, left ([place]) 12/02/2017  . GERD (gastroesophageal reflux disease)   . History of echocardiogram    a. 03/2017 Echo: EF  55-60%, mild LVH, nl RV fxn.  . Hyperlipidemia   . Ischemic cardiomyopathy    a. 04/2018 Echo: EF 30-35%, Gr2 DD, mild LVH. Mild MR. Mildly dil LA, mod dil RV w/ mod red RV fxn, mild TR, PASP 22mHg.  . NSTEMI (non-ST elevated myocardial infarction) ([place]    a. 05/2018 in setting of Afib, sepsis, and post-op L AKA. Peak trop 7.7. EF 30-35% by echo-->cath not performed 2/2 renal failure.  . [place]omy[place] of right fibula [place] 03/05/2017  . PAD (peripheral artery disease) ([place]    a. S/p L fem-pop bypass; b. 11/2017 s/p Aortobifem bypass 2/2 graft occlusion; c. 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  . Paroxysmal atrial fibrillation with rapid ventricular response ([place] 12/02/2017   a. CHA2DS2VASc = 3-->Xarelto; b. 05/2018 Recurrent Afib-->amio.  .Marland KitchenUlcer    Foot   Past Surgical History:  Procedure Laterality Date  . ABDOMINAL AORTAGRAM  June 15, 2014  . ABDOMINAL AORTAGRAM N/A 06/15/2014   Procedure: ABDOMINAL AMaxcine Ham  Surgeon: VSerafina Mitchell MD;  [facility];  Service: Cardiovascular;  Laterality: N/A;  . ABDOMINAL AORTAGRAM N/A 11/22/2014   Procedure: ABDOMINAL AORTAGRAM;  Surgeon: VSerafina Mitchell MD;  [facility];  Service: Cardiovascular;  Laterality: N/A;  . ABDOMINAL AORTOGRAM W/LOWER EXTREMITY N/A 01/07/2017   Procedure: Abdominal Aortogram w/Lower Extremity;  Surgeon: VSerafina Mitchell MD;  [facility];  Service: Cardiovascular;  Laterality: N/A;  . ABDOMINAL AORTOGRAM W/LOWER EXTREMITY N/A 10/31/2017   Procedure: ABDOMINAL AORTOGRAM W/LOWER EXTREMITY;  Surgeon: FElam Dutch MD;  [place];  Service: Cardiovascular;  Laterality: N/A;  . ABDOMINAL AORTOGRAM W/LOWER EXTREMITY N/A 03/24/2018   Procedure: ABDOMINAL AORTOGRAM W/LOWER EXTREMITY;  Surgeon: BSerafina Mitchell MD;  [place] [facility];  Service: Cardiovascular;  Laterality: N/A;  . AMPUTATION Left 04/26/2018   Procedure:  AMPUTATION ABOVE KNEE;  Surgeon: FElam Dutch MD;  [place]  Service: Vascular;  Laterality: Left;  . AORTA - BILATERAL FEMORAL ARTERY BYPASS GRAFT N/A 11/28/2017   Procedure: AORTA BIFEMORAL BYPASS USING HEMASHIELD GOLD GRAFT & REIMPLANT IMA;  Surgeon: BSerafina Mitchell MD;  [facility];  Service: Vascular;  Laterality: N/A;  . AORTIC ARCH ANGIOGRAPHY N/A 10/31/2017   Procedure: AORTIC ARCH ANGIOGRAPHY;  Surgeon: FElam Dutch MD;  [place];  Service: Cardiovascular;  Laterality: N/A;  . APPLICATION OF WOUND VAC  11/28/2017   Procedure: APPLICATION OF WOUND VAC;  Surgeon: BSerafina Mitchell MD;  [facility];  Service: Vascular;;  . APPLICATION OF WOUND VAC Left 03/27/2018   Procedure: APPLICATION OF WOUND VAC LEFT GROIN USING PREVENA PLUS;  Surgeon: BSerafina Mitchell MD;  [place]  Service: Vascular;  Laterality: Left;  . ARTERIAL BYPASS SURGERY   07/05/2010   Right Common Femoral to below knee popliteal BPG  . BACK SURGERY     X's  2  . CARDIAC CATHETERIZATION    . CHOLECYSTECTOMY     Gall Bladder  . CYSTECTOMY Left    wrist  . EMBOLECTOMY Left 11/28/2017   Procedure: Left Lower Extremity Embolectomy, Left Tibial Peroneal Trunk Endarterectomy with Patch Angioplasty;  Vein Harvest Small Saphenous Graft Left Lower Leg;  Surgeon: [name], MD;  Location: [place];  Service: Vascular;  Laterality: Left;  . EYE SURGERY Left 02/23/2020   Dr. Zadie Rhine  . FEMORAL-POPLITEAL BYPASS GRAFT Left 03/27/2018   Procedure: THROMBECTOMY OF LEFT FEMORAL TIBIAL BYPASS;  Surgeon: Serafina Mitchell, MD;  Location: [place];  Service: Vascular;  Laterality: Left;  . FEMORAL-TIBIAL BYPASS GRAFT Left 03/27/2018   Procedure: BYPASS GRAFT FEMORAL-TIBIAL ARTERY LEFT REDO USING CRYOPRESERVED SAPHENOUS VEIN 70cm;  Surgeon: Serafina Mitchell, MD;  Location[facility];  Service: Vascular;  Laterality: Left;  . INTERCOSTAL NERVE BLOCK  November 2015  . INTRAOPERATIVE  ARTERIOGRAM  11/28/2017   Procedure: INTRA OPERATIVE ARTERIOGRAM OF LEFT LEG;  Surgeon: Serafina Mitchell, MD;  Location: [place];  Service: Vascular;;  . INTRAOPERATIVE ARTERIOGRAM Left 03/27/2018   Procedure: INTRA OPERATIVE ARTERIOGRAM TIMES TWO;  Surgeon: Serafina Mitchell, MD;  Location: [place];  Service: Vascular;  Laterality: Left;  . IR FLUORO GUIDE CV LINE RIGHT  03/20/2017  . IR FLUORO GUIDE CV LINE RIGHT  05/05/2018  . IR REMOVAL TUN CV CATH W/O FL  05/20/2018  . IR US GUIDE VASC ACCESS RIGHT  03/20/2017  . IR US GUIDE VASC ACCESS RIGHT  05/05/2018  . IRRIGATION AND DEBRIDEMENT BUTTOCKS Right 09/30/2016   Procedure: DEBRIDEMENT RIGHT  BUTTOCK WOUND;  Surgeon: Georganna Skeans, MD;  Location: [place];  Service: General;  Laterality: Right;  . left foot surgery    . PERIPHERAL VASCULAR CATHETERIZATION N/A 05/07/2016   Procedure: Abdominal Aortogram;  Surgeon: Serafina Mitchell, MD;  Location: [place] CV LAB;  Service: Cardiovascular;  Laterality: N/A;  . PERIPHERAL VASCULAR CATHETERIZATION N/A 05/07/2016   Procedure: Lower Extremity Angiography;  Surgeon: Serafina Mitchell, MD;  Location: [place] CV LAB;  Service: Cardiovascular;  Laterality: N/A;  . PERIPHERAL VASCULAR CATHETERIZATION N/A 05/07/2016   Procedure: Aortic Arch Angiography;  Surgeon: Serafina Mitchell, MD;  Location: [place] CV LAB;  Service: Cardiovascular;  Laterality: N/A;  . PERIPHERAL VASCULAR CATHETERIZATION N/A 05/07/2016   Procedure: Upper Extremity Angiography;  Surgeon: Serafina Mitchell, MD;  Location: [place] CV LAB;  Service: Cardiovascular;  Laterality: N/A;  . PERIPHERAL VASCULAR CATHETERIZATION Right 05/07/2016   Procedure: Peripheral Vascular Balloon Angioplasty;  Surgeon: Serafina Mitchell, MD;  Location: [place] CV LAB;  Service: Cardiovascular;  Laterality: Right;  subclavian  . PERIPHERAL VASCULAR CATHETERIZATION Right 05/07/2016   Procedure: Peripheral Vascular Intervention;  Surgeon: Serafina Mitchell, MD;   Location: [place] CV LAB;  Service: Cardiovascular;  Laterality: Right;  External  Iliac  . SKIN GRAFT Right 2012   [name] by Dr. Nils Pyle- Right and Left Ankle  . SPINE SURGERY    . THROMBECTOMY FEMORAL ARTERY  11/28/2017   Procedure: THROMBECTOMY  & REVISION OF BILATERAL FEMORAL TO POPLETEAL ARTERIES;  Surgeon: Serafina Mitchell, MD;  Location: [place];  Service: Vascular;;  . TONSILLECTOMY    . TRANSMETATARSAL AMPUTATION Right 10/07/2020   Procedure: RIGHT BELOW KNEE AMPUTATION;  Surgeon: Erle Crocker, MD;  Location: [place];  Service: Orthopedics;  Laterality: Right;    Social History:  reports that she has been smoking cigarettes. She has a 30.00 pack-year smoking history. She has never used smokeless tobacco. She reports that she does not drink alcohol and does not use drugs.   Allergies  Allergen Reactions  . Lactose Intolerance (Gi) Diarrhea    Family History  Problem Relation  Age of Onset  . Coronary artery disease Mother   . Peripheral vascular disease Mother   . Heart disease Mother        Before age 39  . Other Mother        Venous insuffiency  . Diabetes Mother   . Hyperlipidemia Mother   . Hypertension Mother   . Varicose Veins Mother   . Heart attack Mother        before age 40  . Heart disease Father   . Diabetes Father   . Diabetes Maternal Grandmother   . Diabetes Paternal Grandmother   . Diabetes Paternal Grandfather   . Diabetes Sister   . Hypertension Sister   . Diabetes Brother   . Hypertension Brother     Prior to Admission medications   Medication Sig Start Date End Date Taking? Authorizing Provider  albuterol (PROVENTIL) 2 MG tablet Take 2 mg by mouth daily as needed for shortness of breath.     [provider]  amitriptyline (ELAVIL) 25 MG tablet Take 1 tablet (25 mg total) by mouth at bedtime. 10/11/20   Pahwani, Michell Heinrich, MD  dicyclomine (BENTYL) 10 MG capsule Take 10 mg by mouth 4 (four) times daily as needed for spasms.     [provider]  furosemide (LASIX) 20 MG tablet Take 2 tablets (40 mg total) by mouth 2 (two) times a week. 10/12/20 10/12/21  Pahwani, Michell Heinrich, MD  furosemide (LASIX) 40 MG tablet Take 40 mg by mouth daily. 11/23/20   [provider]  HYDROcodone-acetaminophen (NORCO) 10-325 MG tablet Take 1 tablet by mouth every 2 (two) hours as needed (pain).  07/14/18   [provider]  insulin glargine (LANTUS) 100 UNIT/ML injection Inject 0.18 mLs (18 Units total) into the skin 2 (two) times daily. Patient taking differently: Inject 30 Units into the skin 2 (two) times daily.  06/03/18   Love, Ivan Anchors, PA-C  insulin lispro (HUMALOG) 100 UNIT/ML injection For glucose 121-150 use TWO units,  for 151-200 use FOUR units, for 201-250 use SIX units, for 251-300 use EIGHT units, for 301-350 use TEN units. For blood sugars greater than 350 call MD. Patient taking differently: Inject 10 Units into the skin 3 (three) times daily with meals. For glucose 121-150 use TWO units,  for 151-200 use FOUR units, for 201-250 use SIX units, for 251-300 use EIGHT units, for 301-350 use TEN units. For blood sugars greater than 350 call MD. 06/03/18   Love, Ivan Anchors, PA-C  LANTUS SOLOSTAR 100 UNIT/ML Solostar Pen Inject into the skin. 11/23/20   [provider]  LYRICA 50 MG capsule Take 1 capsule by mouth twice daily 10/23/20   Fayrene Helper, MD  metoprolol tartrate (LOPRESSOR) 25 MG tablet Take 0.5 tablets (12.5 mg total) by mouth 2 (two) times daily. Patient not taking: Reported on 10/06/2020 07/21/18   Theora Gianotti, NP  naloxone [facility]) 4 MG/0.1ML LIQD nasal spray kit Place 1 spray into the nose once as needed (opioid overdose).    [provider]  omeprazole (PRILOSEC) 40 MG capsule Take 40 mg by mouth daily.     [provider]  omeprazole (PRILOSEC) 40 MG capsule Take 40 mg by mouth daily. 05/15/20   [provider]  oxyCODONE (OXY IR/ROXICODONE) 5 MG  immediate release tablet Take 1-2 tablets (5-10 mg total) by mouth every 6 (six) hours as needed for moderate pain, severe pain or breakthrough pain. 10/11/20   Pahwani, Michell Heinrich,  MD  Rivaroxaban (XARELTO) 15 MG TABS tablet Take 1 tablet (15 mg total) by mouth daily with supper. 10/11/20   Pahwani, Michell Heinrich, MD  rosuvastatin (CRESTOR) 20 MG tablet Take 1 tablet (20 mg total) by mouth daily at 6 PM. Patient not taking: Reported on 10/06/2020 07/23/19   Theora Gianotti, NP  sodium bicarbonate 650 MG tablet Take 1 tablet (650 mg total) by mouth 2 (two) times daily. 10/11/20   Pahwani, Michell Heinrich, MD  vitamin B-12 1000 MCG tablet Take 1 tablet (1,000 mcg total) by mouth daily. 10/12/20   Mckinley Jewel, MD    Physical Exam: BP 105/64   Pulse 75   Temp 98.2 F (36.8 C) (Oral)   Resp 16   Ht '5\' 7"'  (1.702 m)   Wt 72.6 kg   SpO2 100%   BMI 25.07 kg/m   . General: 55 y.o. year-old female obese, chronically ill-appearing but in no acute distress.  Alert and oriented x3. Marland Kitchen HEENT: NCAT . Cardiovascular: Regular rate and rhythm with no rubs or gallops.  No thyromegaly or JVD noted.  No lower extremity edema. 2/4 pulses in all 4 extremities. Marland Kitchen Respiratory: Tachypnea, audible wheezes without stethoscope, decreased breath sounds (worse in lower lobes).   . Abdomen: Soft nontender distended with normal bowel sounds x4 quadrants. . Muskuloskeletal: Left AKA, right BKA, +2 pitting edema.  No cyanosis or clubbing . Neuro: CN II-XII intact, strength, sensation, reflexes . Skin: No ulcerative lesions noted or rashes . Psychiatry: Judgement and insight appear normal. Mood is appropriate for condition and setting          Labs on Admission:  Basic Metabolic Panel: Recent Labs  Lab 11/29/20 1730  NA 137  K 3.5  CL 108  CO2 18*  GLUCOSE 79  BUN 31*  CREATININE 2.10*  CALCIUM 7.8*   Liver Function Tests: Recent Labs  Lab 11/29/20 1730  AST 15  ALT 8  ALKPHOS 84  BILITOT 0.4  PROT 6.2*   ALBUMIN 2.7*   No results for input(s): LIPASE, AMYLASE in the last 168 hours. No results for input(s): AMMONIA in the last 168 hours. CBC: Recent Labs  Lab 11/29/20 1730  WBC 3.6*  NEUTROABS 2.4  HGB 9.5*  HCT 32.7*  MCV 89.8  PLT 237   Cardiac Enzymes: No results for input(s): CKTOTAL, CKMB, CKMBINDEX, TROPONINI in the last 168 hours.  BNP (last 3 results) Recent Labs    11/29/20 1730  BNP 2,629.0*    ProBNP (last 3 results) No results for input(s): PROBNP in the last 8760 hours.  CBG: No results for input(s): GLUCAP in the last 168 hours.  Radiological Exams on Admission: DG Chest Port 1 View  Result Date: 11/29/2020 CLINICAL DATA:  55 year female with shortness of breath. EXAM: PORTABLE CHEST 1 VIEW COMPARISON:  Chest radiograph dated 05/02/2018 FINDINGS: Dated there is cardiomegaly with mild vascular congestion. Left lung base linear atelectasis/scarring. No focal consolidation, pleural effusion or pneumothorax. No acute osseous pathology. IMPRESSION: Cardiomegaly with mild vascular congestion. No focal consolidation. Electronically Signed   By: Anner Crete M.D.   On: 11/29/2020 17:50    EKG: I independently viewed the EKG done and my findings are as followed: Normal sinus rhythm at a rate of 69 bpm with QTc 581 ms  Assessment/Plan Present on Admission: . Chronic back pain . Peripheral vascular disease, unspecified ([place]) . Atrial fibrillation with rapid ventricular response ([place]) . Hyperlipidemia . Hyperlipidemia LDL goal <70 . Gastroesophageal  reflux disease  Principal Problem:   Acute exacerbation of CHF (congestive heart failure) (HCC) Active Problems:   Peripheral vascular disease, unspecified ([place])   Hyperlipidemia LDL goal <70   DM2 (diabetes mellitus, type 2) (HCC)   Chronic back pain   Acute respiratory failure with hypoxia (HCC)   Atrial fibrillation with rapid ventricular response (HCC)   Hyperlipidemia   Gastroesophageal reflux  disease   Chronic kidney disease   Leukopenia   Elevated brain natriuretic peptide (BNP) level   Elevated d-dimer   Prolonged QT interval   Acute respiratory failure with hypoxia possible secondary to acute on chronic diastolic CHF Elevated BNP Chest x-ray showed cardiomegaly with mild vascular congestion with no focal consolidation BNP 2,629, though kidney disease may be a contributing factor, but clinically, patient appears to be volume overloaded. Continue total input/output, daily weights and fluid restriction IV Lasix 80 mg was given in the ED, continue IV Lasix 40 twice daily as tolerated by BP Continue Cardiac diet  Echocardiogram done in January 2020 showed LVEF of 50 to 35% with impaired relaxation diastolic filling patterns.  Echocardiogram will be done in the morning   Elevated D-dimer D-dimer 4.75, patient's shortness of breath appears to be related to above, however, it will be reasonable to rule out PE.  CT angiography of chest cannot be done due to kidney status.  VQ scan will be done in the morning. Patient has not been taking home Xarelto per med rec, therapeutic Lovenox (per pharmacy dosing) will be given at this time pending VQ scan  History of A. fib with RVR Patient is currently in normal sinus rhythm Patient has not been taking home Xarelto per med rec, therapeutic Lovenox will be given, consider restarting patient on home Xarelto Lopressor temporarily held due to soft BP  Peripheral vascular disease s/p bilateral lower extremity amputation Patient has not been taking home statin per med rec   Ambulatory dysfunction Continue fall precaution and neurochecks Continue PT/OT eval and treat  Prolonged QTc Avoid QT prolonging drugs Magnesium level will be checked Continue telemetry  Leukopenia possibly reactive WBC 3.6, no obvious sign of acute infectious process at this time Continue to monitor patient and treat accordingly Continue to monitor WBC with  morning labs  Essential hypertension Continue IV Lasix 40 twice daily with holding parameters Metoprolol temporarily held due to soft BP  Hyperlipidemia Patient has not been taking statin per med rec  Type 2 diabetes mellitus Continue ISS and hypoglycemic protocol  GERD Continue Protonix  Chronic back pain/diabetic polyneuropathy Continue home meds  Chronic kidney disease stage IV  BUN/creatinine 31/2.1 (baseline creatinine 2.3-2.7) Renally adjust medications, avoid nephrotoxic agents/dehydration/hypotension Patient is yet to schedule an appointment with outpatient nephrologist.  Nephrology will be consulted while inpatient.  DVT prophylaxis: Lovenox  Code Status: Full code  Family Communication: Husband at bedside (all questions answered to satisfaction)  Disposition Plan:  Patient is from:                        home Anticipated DC to:                   home Anticipated DC date:               2-3 days Anticipated DC barriers:         Consults called: Nephrology  Admission status: Inpatient    Bernadette Hoit MD [name]  11/29/2020, 9:27 PM

## 2020-11-30 ENCOUNTER — Inpatient Hospital Stay (HOSPITAL_COMMUNITY): Payer: Medicare Other

## 2020-11-30 ENCOUNTER — Encounter (HOSPITAL_COMMUNITY): Payer: Self-pay | Admitting: Internal Medicine

## 2020-11-30 DIAGNOSIS — I5031 Acute diastolic (congestive) heart failure: Secondary | ICD-10-CM | POA: Diagnosis not present

## 2020-11-30 LAB — ECHOCARDIOGRAM COMPLETE
AR max vel: 1.17 cm2
AV Area VTI: 1.28 cm2
AV Area mean vel: 1.3 cm2
AV Mean grad: 6.6 mmHg
AV Peak grad: 13.8 mmHg
Ao pk vel: 1.86 m/s
Area-P 1/2: 3.16 cm2
Height: 67 in
S' Lateral: 4.22 cm
Weight: 2560.86 oz

## 2020-11-30 LAB — COMPREHENSIVE METABOLIC PANEL
ALT: 9 U/L (ref 0–44)
AST: 9 U/L — ABNORMAL LOW (ref 15–41)
Albumin: 2.4 g/dL — ABNORMAL LOW (ref 3.5–5.0)
Alkaline Phosphatase: 80 U/L (ref 38–126)
Anion gap: 10 (ref 5–15)
BUN: 33 mg/dL — ABNORMAL HIGH (ref 6–20)
CO2: 22 mmol/L (ref 22–32)
Calcium: 7.9 mg/dL — ABNORMAL LOW (ref 8.9–10.3)
Chloride: 106 mmol/L (ref 98–111)
Creatinine, Ser: 2.02 mg/dL — ABNORMAL HIGH (ref 0.44–1.00)
GFR, Estimated: 28 mL/min — ABNORMAL LOW (ref >60)
Glucose, Bld: 172 mg/dL — ABNORMAL HIGH (ref 70–99)
Potassium: 3.5 mmol/L (ref 3.5–5.1)
Sodium: 138 mmol/L (ref 135–145)
Total Bilirubin: 0.6 mg/dL (ref 0.3–1.2)
Total Protein: 5.8 g/dL — ABNORMAL LOW (ref 6.5–8.1)

## 2020-11-30 LAB — URINALYSIS, ROUTINE W REFLEX MICROSCOPIC
Bilirubin Urine: NEGATIVE
Glucose, UA: NEGATIVE mg/dL
Ketones, ur: NEGATIVE mg/dL
Nitrite: NEGATIVE
Protein, ur: 30 mg/dL — AB
Specific Gravity, Urine: 1.01 (ref 1.005–1.030)
pH: 5 (ref 5.0–8.0)

## 2020-11-30 LAB — CBC
HCT: 31.2 % — ABNORMAL LOW (ref 36.0–46.0)
Hemoglobin: 9 g/dL — ABNORMAL LOW (ref 12.0–15.0)
MCH: 26 pg (ref 26.0–34.0)
MCHC: 28.8 g/dL — ABNORMAL LOW (ref 30.0–36.0)
MCV: 90.2 fL (ref 80.0–100.0)
Platelets: 221 10*3/uL (ref 150–400)
RBC: 3.46 MIL/uL — ABNORMAL LOW (ref 3.87–5.11)
RDW: 18.1 % — ABNORMAL HIGH (ref 11.5–15.5)
WBC: 3.7 10*3/uL — ABNORMAL LOW (ref 4.0–10.5)
nRBC: 0 % (ref 0.0–0.2)

## 2020-11-30 LAB — GLUCOSE, CAPILLARY
Glucose-Capillary: 136 mg/dL — ABNORMAL HIGH (ref 70–99)
Glucose-Capillary: 110 mg/dL — ABNORMAL HIGH (ref 70–99)

## 2020-11-30 LAB — PHOSPHORUS: Phosphorus: 6.5 mg/dL — ABNORMAL HIGH (ref 2.5–4.6)

## 2020-11-30 LAB — POC URINE PREG, ED: Preg Test, Ur: NEGATIVE

## 2020-11-30 LAB — APTT: aPTT: 41 seconds — ABNORMAL HIGH (ref 24–36)

## 2020-11-30 LAB — CBG MONITORING, ED
Glucose-Capillary: 160 mg/dL — ABNORMAL HIGH (ref 70–99)
Glucose-Capillary: 144 mg/dL — ABNORMAL HIGH (ref 70–99)

## 2020-11-30 LAB — MAGNESIUM: Magnesium: 1.4 mg/dL — ABNORMAL LOW (ref 1.7–2.4)

## 2020-11-30 LAB — HEMOGLOBIN A1C
Hgb A1c MFr Bld: 5.7 % — ABNORMAL HIGH (ref 4.8–5.6)
Mean Plasma Glucose: 116.89 mg/dL

## 2020-11-30 LAB — PROTIME-INR
INR: 1.4 — ABNORMAL HIGH (ref 0.8–1.2)
Prothrombin Time: 16.4 seconds — ABNORMAL HIGH (ref 11.4–15.2)

## 2020-11-30 MED ORDER — PREGABALIN 50 MG PO CAPS
50.0000 mg | ORAL_CAPSULE | Freq: Two times a day (BID) | ORAL | Status: DC
  Administered 2020-11-30 – 2020-12-02 (×4): 50 mg via ORAL
  Filled 2020-11-30 (×4): qty 1

## 2020-11-30 MED ORDER — SODIUM CHLORIDE 0.9 % IV SOLN
1.0000 g | INTRAVENOUS | Status: DC
  Administered 2020-11-30 – 2020-12-01 (×2): 1 g via INTRAVENOUS
  Filled 2020-11-30 (×2): qty 10

## 2020-11-30 MED ORDER — ROSUVASTATIN CALCIUM 20 MG PO TABS
20.0000 mg | ORAL_TABLET | Freq: Every day | ORAL | Status: DC
  Administered 2020-11-30 – 2020-12-02 (×3): 20 mg via ORAL
  Filled 2020-11-30 (×3): qty 1

## 2020-11-30 MED ORDER — TECHNETIUM TO 99M ALBUMIN AGGREGATED
4.0000 | Freq: Once | INTRAVENOUS | Status: AC | PRN
  Administered 2020-11-30: 4.4 via INTRAVENOUS

## 2020-11-30 MED ORDER — SODIUM BICARBONATE 650 MG PO TABS
650.0000 mg | ORAL_TABLET | Freq: Two times a day (BID) | ORAL | Status: DC
  Administered 2020-11-30 – 2020-12-02 (×4): 650 mg via ORAL
  Filled 2020-11-30 (×4): qty 1

## 2020-11-30 MED ORDER — METOLAZONE 5 MG PO TABS
2.5000 mg | ORAL_TABLET | Freq: Once | ORAL | Status: AC
  Administered 2020-11-30: 2.5 mg via ORAL
  Filled 2020-11-30: qty 1

## 2020-11-30 MED ORDER — RIVAROXABAN 15 MG PO TABS
15.0000 mg | ORAL_TABLET | Freq: Every day | ORAL | Status: DC
  Administered 2020-11-30 – 2020-12-01 (×2): 15 mg via ORAL
  Filled 2020-11-30 (×2): qty 1

## 2020-11-30 MED ORDER — OXYCODONE HCL 5 MG PO TABS
5.0000 mg | ORAL_TABLET | ORAL | Status: DC | PRN
Start: 2020-11-30 — End: 2020-12-02
  Administered 2020-11-30 – 2020-12-02 (×5): 5 mg via ORAL
  Filled 2020-11-30 (×5): qty 1

## 2020-11-30 NOTE — TOC Initial Note (Signed)
Transition of Care [facility]) - Initial/Assessment Note    Patient Details  Name: [name] MRN: [id number] Date of Birth: Aug 07, 1966  Transition of Care [facility]) CM/SW Contact:    Iona Beard, [place] Phone Number: 11/30/2020, 7:45 PM  Clinical Narrative:                 Pt admitted due to acute exacerbation of CHF. TOC consulted for SNF placement. CSW visited pt in room to assess. Pt states that she lives with her husband and father in law. Pt states that she completes her ADLs but that her husband will assist in getting the hot water for her bath. Pt states that she was set up with [facility] services but they had only completed the assessment. Pt uses a wheelchair and scooter. CSW informed pt of PT recommendation of SNF, pt states she knows she "needs help". Pt worried about her insurance paying, CSW updated pt that TOC works to get insurance [place]. Pt agreeable to referral being sent to [place] and [place]. Pt does not want to go to [place] or [place]. Pt has PASRR: [id number] A. TOC to start Fl2 and being referring out for SNF. TOC to follow.   Expected Discharge Plan: Skilled Nursing Facility Barriers to Discharge: Continued Medical Work up   Patient Goals and CMS Choice Patient states their goals for this hospitalization and ongoing recovery are:: Go to SNF for rehab CMS Medicare.gov Compare Post Acute Care list provided to:: Patient Choice offered to / list presented to : Patient  Expected Discharge Plan and Services Expected Discharge Plan: [place] In-house Referral: Clinical Social Work Discharge Planning Services: CM Consult Post Acute Care Choice: [place] arrangements for the past 2 months: Single Family Home                 DME Arranged: N/A DME Agency: NA       HH Arranged: NA [name] Agency: NA        Prior Living Arrangements/Services Living arrangements for the past 2 months: [place] Lives with::  Spouse,Relatives Patient language and need for interpreter reviewed:: Yes Do you feel safe going back to the place where you live?: Yes      Need for Family Participation in Patient Care: No (Comment) Care giver support system in place?: Yes (comment) Current home services: DME (walker and scooter) Criminal Activity/Legal Involvement Pertinent to Current Situation/Hospitalization: No - Comment as needed  Activities of Daily Living Home Assistive Devices/Equipment: Wheelchair ADL Screening (condition at time of admission) Patient's cognitive ability adequate to safely complete daily activities?: Yes Is the patient deaf or have difficulty hearing?: No Does the patient have difficulty seeing, even when wearing glasses/contacts?: No Does the patient have difficulty concentrating, remembering, or making decisions?: No Patient able to express need for assistance with ADLs?: Yes Does the patient have difficulty dressing or bathing?: No Independently performs ADLs?: Yes (appropriate for developmental age) Does the patient have difficulty walking or climbing stairs?: Yes Weakness of Legs: None Weakness of Arms/Hands: None  Permission Sought/Granted                  Emotional Assessment Appearance:: Appears older than stated age Attitude/Demeanor/Rapport: Engaged Affect (typically observed): Accepting Orientation: : Oriented to Self,Oriented to Place,Oriented to  Time,Oriented to Situation Alcohol / Substance Use: Not Applicable Psych Involvement: No (comment)  Admission diagnosis:  Acute exacerbation of CHF (congestive heart failure) ([place]) [I50.9] Acute respiratory failure with hypoxia ([place]) [J96.01]  Pulmonary emboli (HCC) [I26.99] Elevated d-dimer [R79.89] Acute on chronic congestive heart failure, unspecified heart failure type [facility]) [I50.9] Patient Active Problem List   Diagnosis Date Noted  . Acute exacerbation of CHF (congestive heart failure) ([place]) 11/29/2020  . Leukopenia  11/29/2020  . Elevated brain natriuretic peptide (BNP) level 11/29/2020  . Elevated d-dimer 11/29/2020  . Prolonged QT interval 11/29/2020  . Foot osteomyelitis, right ([place]) 10/06/2020  . Stable treated proliferative diabetic retinopathy of left eye determined by examination associated with type 2 diabetes mellitus ([place]) 08/14/2020  . Controlled type 2 diabetes mellitus with left eye affected by proliferative retinopathy with traction retinal detachment not involving macula, with long-term current use of insulin ([place]) 02/24/2020  . Macular pucker, left eye 02/24/2020  . Follow-up examination after eye surgery 02/24/2020  . Coronary artery disease involving native coronary artery of native heart without angina pectoris 11/21/2018  . Ischemic cardiomyopathy 11/21/2018  . Chronic systolic heart failure ([place]) 11/21/2018  . Persistent atrial fibrillation ([place]) 11/21/2018  . Chronic kidney disease 11/21/2018  . Essential hypertension 11/21/2018  . Abnormality of gait 07/29/2018  . Neuropathic pain   . Labile blood pressure   . Diabetic retinopathy associated with type 2 diabetes mellitus ([place])   . Hypoalbuminemia due to protein-calorie malnutrition ([place])   . Type 2 diabetes mellitus with peripheral neuropathy (HCC)   . Chronic diastolic congestive heart failure ([place])   . Above knee amputation status, left 05/22/2018  . AKI (acute kidney injury) ([place])   . Unilateral AKA, left ([place])   . ATN (acute tubular necrosis) ([place])   . Atrial fibrillation with rapid ventricular response ([place])   . Diabetic peripheral neuropathy ([place])   . Acute blood loss anemia   . Anemia of chronic disease   . Hyperlipidemia   . Gastroesophageal reflux disease   . Non-ST elevation (NSTEMI) myocardial infarction ([place])   . Acute combined systolic and diastolic heart failure ([place])   . Acute encephalopathy   . Acute renal failure ([place])   . Acute respiratory failure with hypoxia ([place])   . Sepsis ([place])   . Lower limb  ischemia 04/25/2018  . Microcytic anemia 04/25/2018  . Pulmonary nodule 04/25/2018  . Gangrene of lower extremity ([place]) 04/25/2018  . Femoral-popliteal bypass graft occlusion, left ([place]) 12/02/2017  . Respiratory failure with hypoxia ([place]) 12/02/2017  . Leukocytosis 12/02/2017  . Paroxysmal atrial fibrillation with rapid ventricular response ([place]) 12/02/2017  . Thrombosis 11/24/2017  . Hyperkalemia 06/26/2017  . Atherosclerosis of native artery of right lower extremity with ulceration of ankle ([place]) 05/13/2017  . Osteomyelitis of right fibula ([place]) 03/05/2017  . Non-pressure chronic ulcer of right ankle limited to breakdown of skin ([place]) 01/17/2017  . Elevated lactic acid level   . Osteomyelitis of ankle ([place]) 12/29/2016  . Chronic pain 12/24/2016  . Buttock wound, left, subsequent encounter   . Hypotension 12/23/2016  . IDDM (insulin dependent diabetes mellitus) 12/23/2016  . Acute kidney injury ([place]) 12/23/2016  . Malnutrition ([place]) 12/23/2016  . Thrush 12/23/2016  . Septic shock ([place]) 11/27/2016  . DM2 (diabetes mellitus, type 2) ([place]) 11/27/2016  . Chronic back pain 11/27/2016  . Orthostatic hypotension 11/27/2016  . Demand ischemia ([place])   . Hyperlipidemia LDL goal <70   . Right sided weakness   . Retinal tumor of right eye   . Pressure injury of skin 10/01/2016  . Necrotizing fasciitis ([place]) 09/30/2016  . 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 Paresthesia of both hands 03/05/2016  .  DDD (degenerative disc disease), lumbosacral 03/26/2015  . DDD (degenerative disc disease), cervical 03/26/2015  . Sacroiliac joint disease 03/26/2015  . Occipital neuralgia 03/26/2015  . Pain in limb-Bilateral Leg 04/25/2014  . Peripheral vascular disease ([place]) 04/05/2013  . Chronic total occlusion of artery of the extremities ([place]) 02/10/2012  . Peripheral vascular disease, unspecified ([place]) 02/10/2012   PCP:  Frazier Richards, MD Pharmacy:   [place], [place] - [place] [address] [place] [address] [place] [address] Phone: [phone] Fax: [phone]     Social Determinants of Health (SDOH) Interventions    Readmission Risk Interventions Readmission Risk Prevention Plan 11/30/2020 10/11/2020  Transportation Screening Complete Complete  PCP or Specialist Appt within 5-7 Days - Complete  Home Care Screening - Complete  Medication Review (RN CM) - Complete  HRI or Home Care Consult Complete -  Social Work Consult for [place] Planning/Counseling Complete -  Palliative Care Screening Not Applicable -  Medication Review Press photographer) Complete -  Some recent data might be hidden

## 2020-11-30 NOTE — Evaluation (Signed)
Physical Therapy Evaluation Patient Details Name: [name] MRN: [id number] DOB: [birth year] Today's Date: 11/30/2020   History of Present Illness  [name] is a 55 y.o. female with medical history significant for  type 2 diabetes, diabetic polyneuropathy, GERD, hyperlipidemia, paroxysmal A. fib, ischemic cardiomyopathy, peripheral vascular disease post left above-the-knee amputation and Right BKA who presents to the emergency department via EMS accompanied by her husband due to 1 week of cough (occasionally productive with clear sputum), chest congestion, nasal congestion and 3-day onset of shortness of breath.  Patient takes Lasix at baseline and she states that the dosage was being changed (recently reduced to 20 Mg from 80 mg due to worsening kidney function), otherwise she states that she has been compliant with the Lasix.  She also complains of increased abdominal girth which she states was possibly due to fluid accommodation.  Home PT checked O2 sat today and was in the high 70s, this was re- checked by husband and was noted to be in the 77s, EMS was activated, and patient was sent to the ED for further evaluation.  She denies orthopnea, chest pain, abdominal pain, fever, chills, nausea or vomiting.    Patient was supposed to follow-up with a nephrologist regarding her kidney status, however, she is yet to have an appointment with a nephrologist.  She is wheelchair-bound due to bilateral lower extremity amputation.    Clinical Impression  Patient demonstrates labored movement for long sitting in bed having to use bilateral bed rails to support self, unable to turn in bed or attempt transfers due to severe swelling of BLE with discomfort and c/o severe pain in low back.  Patient will benefit from continued physical therapy in hospital and recommended venue below to increase strength, balance, endurance for safe ADLs and transfers.    Follow Up Recommendations SNF;Supervision for  mobility/OOB;Supervision - Intermittent    Equipment Recommendations  None recommended by PT    Recommendations for Other Services       Precautions / Restrictions Precautions Precautions: Fall Restrictions Weight Bearing Restrictions: No      Mobility  Bed Mobility Overal bed mobility: Needs Assistance Bed Mobility: Supine to Sit;Sit to Supine     Supine to sit: Supervision Sit to supine: Supervision   General bed mobility comments: had to use bilateral bedrails to pull self to long sitting    Transfers                    Ambulation/Gait                Stairs            Wheelchair Mobility    Modified Rankin (Stroke Patients Only)       Balance Overall balance assessment: Needs assistance Sitting-balance support: Feet unsupported;Bilateral upper extremity supported Sitting balance-Leahy Scale: Fair Sitting balance - Comments: long sitting in bed holding onto bed rails                                     Pertinent Vitals/Pain Pain Assessment: 0-10 Pain Score: 7  Pain Location: chronic low back Pain Descriptors / Indicators: Aching;Sore;Grimacing Pain Intervention(s): Limited activity within patient's tolerance;Monitored during session;Repositioned    Home Living Family/patient expects to be discharged to:: Private residence Living Arrangements: Spouse/significant other Available Help at Discharge: Family;Available PRN/intermittently Type of Home: House Home Access: Ramped entrance  Home Layout: One level Home Equipment: Bedside commode;Wheelchair - [employer] - 2 wheels;Cane - single point Additional Comments: has L prosthethic, uses bed pan, wash pan baths    Prior Function Level of Independence: Needs assistance   Gait / Transfers Assistance Needed: Mod Independent transfers using sliding board or scooting into w/c, non-ambulatory, has prosthetic leg, but never used  ADL's / Homemaking Assistance  Needed: assisted by family        Hand Dominance   Dominant Hand: Right    Extremity/Trunk Assessment   Upper Extremity Assessment Upper Extremity Assessment: Defer to OT evaluation    Lower Extremity Assessment Lower Extremity Assessment: Generalized weakness    Cervical / Trunk Assessment Cervical / Trunk Assessment: Normal  Communication   Communication: No difficulties  Cognition Arousal/Alertness: Awake/alert Behavior During Therapy: WFL for tasks assessed/performed Overall Cognitive Status: Within Functional Limits for tasks assessed                                        General Comments      Exercises     Assessment/Plan    PT Assessment Patient needs continued PT services  PT Problem List Decreased strength;Decreased activity tolerance;Decreased balance;Decreased mobility;Cardiopulmonary status limiting activity       PT Treatment Interventions DME instruction;Functional mobility training;Therapeutic activities;Therapeutic exercise;Balance training;Patient/family education;Wheelchair mobility training    PT Goals (Current goals can be found in the Care Plan section)  Acute Rehab PT Goals Patient Stated Goal: return home able to transfer on her own PT Goal Formulation: With patient Time For Goal Achievement: 12/14/20 Potential to Achieve Goals: Good    Frequency Min 3X/week   Barriers to discharge        Co-evaluation               AM-PAC PT "6 Clicks" Mobility  Outcome Measure Help needed turning from your back to your side while in a flat bed without using bedrails?: A Little Help needed moving from lying on your back to sitting on the side of a flat bed without using bedrails?: A Little Help needed moving to and from a bed to a chair (including a wheelchair)?: A Lot Help needed standing up from a chair using your arms (e.g., wheelchair or bedside chair)?: Total Help needed to walk in hospital room?: Total Help needed  climbing 3-5 steps with a railing? : Total 6 Click Score: 11    End of Session Equipment Utilized During Treatment: Oxygen Activity Tolerance: Patient limited by fatigue;Patient limited by pain Patient left: in bed;with call bell/phone within reach Nurse Communication: Mobility status PT Visit Diagnosis: Muscle weakness (generalized) (M62.81);Pain;Difficulty in walking, not elsewhere classified (R26.2)    Time: 6073-7106 PT Time Calculation (min) (ACUTE ONLY): 23 min   Charges:   PT Evaluation $PT Eval Moderate Complexity: 1 Mod PT Treatments $Therapeutic Activity: 23-37 mins        3:28 PM, 11/30/20 Lonell Grandchild, MPT Physical Therapist with [facility] [phone] office [phone] mobile phone

## 2020-11-30 NOTE — Progress Notes (Signed)
Patient Demographics:    [name], is a 55 y.o. female, DOB - 1966-03-10, ZOX:[id number]  Admit date - 11/29/2020   Admitting Physician Dron Tanna Furry, MD  Outpatient Primary MD for the patient is Adamo, [name], MD  LOS - 1  Chief Complaint  Patient presents with  . Shortness of Breath        Subjective:    [name] today has no fevers, no emesis,  No chest pain,  C/o shob,   Assessment  & Plan :    Principal Problem:   Acute exacerbation of CHF (congestive heart failure) (HCC) Active Problems:   Peripheral vascular disease, unspecified (HCC)   Hyperlipidemia LDL goal <70   DM2 (diabetes mellitus, type 2) (HCC)   Chronic back pain   Acute respiratory failure with hypoxia (HCC)   Atrial fibrillation with rapid ventricular response (HCC)   Hyperlipidemia   Gastroesophageal reflux disease   Chronic kidney disease   Essential hypertension   Leukopenia   Elevated brain natriuretic peptide (BNP) level   Elevated d-dimer   Prolonged QT interval  Brief Summary:-  55 y.o. female with medical history significant for type 2 diabetes, diabetic polyneuropathy, GERD, hyperlipidemia, paroxysmal A. fib, ischemic cardiomyopathy, peripheral vascular disease post left above-the-knee amputation and Right BKA admitted on 11/29/2020 with acute hypoxic respiratory failure secondary to acute on chronic CHF  A/p 1)HFrEF--patient with acute on chronic combined systolic and diastolic dysfunction CHF  -BNP 2629, Cxr with pulmonary venous congestion and cardiomegaly -- Repeat echo on 12/01/2019 with EF of 35 to 40% from 50 to 55% back in January 2020 with regional wall motion of normalities, -Echo from January 2020 also had grade 1 diastolic dysfunction ??  Ischemia work-up -Continue IV Lasix, daily fluid input and output monitoring, daily weight, -Cardiology consult/input requested  2) elevated  D-dimer-- D-Dimer 4.75, PTA patient was on Xarelto for atrial fibrillation, VQ scan without definite PE findings -Lower Extremity venous Dopplers without acute DVT -Renal function precludes CTA chest -Patient received therapeutic Lovenox on admission, continue Xarelto  3) chronic atrial fibrillation--hold metoprolol due to soft BP, continue Xarelto for stroke prophylaxis  4)CKD IV---creatinine is currently 2.0 which is actually lower than previous baseline suspect some component of hemodilution due to CHF -renally adjust medications, avoid nephrotoxic agents / dehydration  / hypotension  5) ambulatory dysfunction/wheelchair-bound status due to bilateral amputations--- patient with right BKA and left AKA----husband is primary caregiver -Physical therapy evaluation appreciated recommends SNF rehab  6)Dm2-A1c is 5.7 reflecting excellent diabetic control PTA Use Novolog/Humalog Sliding scale insulin with Accu-Cheks/Fingersticks as ordered   7)PAD--status post prior right BKA and left AKA, continue Xarelto, and crestor  8) possible UTI--- IV Rocephin pending culture  9) acute hypoxic respiratory failure--- secondary to #1 above, PE less likely, -Should improve with diuresis -Currently on 2 L of oxygen via nasal  Disposition/Need for in-Hospital Stay- patient unable to be discharged at this time due to -acute on chronic CHF exacerbation requiring IV diuresis, soft BP requiring close monitoring -UTI requiring IV antibiotics*  Status is: Inpatient  Remains inpatient appropriate because:Please see above   Disposition: The patient is from: Home              Anticipated d/c is  to: SNF              Anticipated d/c date is: 2 days              Patient currently is not medically stable to d/c. Barriers: Not Clinically Stable-   Code Status :  -  Code Status: Full Code   Family Communication:    NA (patient is alert, awake and coherent)   Consults  :  Cardi  DVT Prophylaxis  :   - SCDs *   SCDs Start: 11/29/20 1954 Rivaroxaban (XARELTO) tablet 15 mg    Lab Results  Component Value Date   PLT 221 11/30/2020    Inpatient Medications  Scheduled Meds: . furosemide  40 mg Intravenous Q12H  . insulin aspart  0-5 Units Subcutaneous QHS  . insulin aspart  0-9 Units Subcutaneous TID WC  . pantoprazole  40 mg Oral Daily  . Rivaroxaban  15 mg Oral Q supper   Continuous Infusions: PRN Meds:.  Anti-infectives (From admission, onward)   None        Objective:   Vitals:   11/30/20 1330 11/30/20 1401 11/30/20 1530 11/30/20 1558  BP: (!) 106/91  (!) 108/95 (!) 98/59  Pulse:    84  Resp: (!) 22   18  Temp:  98.1 F (36.7 C)  98.5 F (36.9 C)  TempSrc:  Oral  Oral  SpO2:    100%  Weight:      Height:        Wt Readings from Last 3 Encounters:  11/29/20 72.6 kg  10/06/20 72.6 kg  01/22/19 72.8 kg    No intake or output data in the 24 hours ending 11/30/20 1658  Physical Exam  Gen:- Awake Alert, no conversational dyspnea HEENT:- [place].AT, No sclera icterus, legally blind Neck-Supple Neck,No JVD,.  Lungs-diminished in bases, faint bibasilar Rales CV- S1, S2 normal, irregular  Abd-  +ve B.Sounds, Abd Soft, No tenderness,    Extremity/Skin:-Rt BKA, Lt AKA  Psych-affect is appropriate, oriented x3 Neuro-lower extremity amputations, no new focal deficits, no tremors   Data Review:   Micro Results Recent Results (from the past 240 hour(s))  Blood Culture (routine x 2)     Status: None (Preliminary result)   Collection Time: 11/29/20  5:30 PM   Specimen: BLOOD  Result Value Ref Range Status   Specimen Description BLOOD RIGHT ANTECUBITAL  Final   Special Requests   Final    BOTTLES DRAWN AEROBIC AND ANAEROBIC Blood Culture results may not be optimal due to an inadequate volume of blood received in culture bottles   Culture   Final    NO GROWTH < 24 HOURS Performed at [facility], [address], [place], [place] [address]    Report Status PENDING   Incomplete  Resp Panel by RT-PCR (Flu A&B, Covid) Nasopharyngeal Swab     Status: None   Collection Time: 11/29/20  5:52 PM   Specimen: Nasopharyngeal Swab; Nasopharyngeal(NP) swabs in vial transport medium  Result Value Ref Range Status   SARS Coronavirus 2 by RT PCR NEGATIVE NEGATIVE Final    Comment: (NOTE) SARS-CoV-2 target nucleic acids are NOT DETECTED.  The SARS-CoV-2 RNA is generally detectable in upper respiratory specimens during the acute phase of infection. The lowest concentration of SARS-CoV-2 viral copies this assay can detect is 138 copies/mL. A negative result does not preclude SARS-Cov-2 infection and should not be used as the sole basis for treatment or other patient management decisions. A  negative result may occur with  improper specimen collection/handling, submission of specimen other than nasopharyngeal swab, presence of viral mutation(s) within the areas targeted by this assay, and inadequate number of viral copies(<138 copies/mL). A negative result must be combined with clinical observations, patient history, and epidemiological information. The expected result is Negative.  Fact Sheet for Patients:  EntrepreneurPulse.com.au  Fact Sheet for Healthcare Providers:  IncredibleEmployment.be  This test is no t yet approved or cleared by the [place] FDA and  has been authorized for detection and/or diagnosis of SARS-CoV-2 by FDA under an Emergency Use Authorization (EUA). This EUA will remain  in effect (meaning this test can be used) for the duration of the COVID-19 declaration under Section 564(b)(1) of the Act, 21 U.S.C.section 360bbb-3(b)(1), unless the authorization is terminated  or revoked sooner.       Influenza A by PCR NEGATIVE NEGATIVE Final   Influenza B by PCR NEGATIVE NEGATIVE Final    Comment: (NOTE) The Xpert Xpress SARS-CoV-2/FLU/RSV plus assay is intended as an aid in the diagnosis of influenza  from Nasopharyngeal swab specimens and should not be used as a sole basis for treatment. Nasal washings and aspirates are unacceptable for Xpert Xpress SARS-CoV-2/FLU/RSV testing.  Fact Sheet for Patients: EntrepreneurPulse.com.au  Fact Sheet for Healthcare Providers: IncredibleEmployment.be  This test is not yet approved or cleared by the [place] FDA and has been authorized for detection and/or diagnosis of SARS-CoV-2 by FDA under an Emergency Use Authorization (EUA). This EUA will remain in effect (meaning this test can be used) for the duration of the COVID-19 declaration under Section 564(b)(1) of the Act, 21 U.S.C. section 360bbb-3(b)(1), unless the authorization is terminated or revoked.  Performed at [facility], [address], [place], [place] [address]   Blood Culture (routine x 2)     Status: None (Preliminary result)   Collection Time: 11/29/20  8:02 PM   Specimen: BLOOD RIGHT ARM  Result Value Ref Range Status   Specimen Description BLOOD RIGHT ARM  Final   Special Requests   Final    BOTTLES DRAWN AEROBIC ONLY Blood Culture results may not be optimal due to an inadequate volume of blood received in culture bottles   Culture   Final    NO GROWTH < 24 HOURS Performed at [facility] [address] [place], [place] [address]    Report Status PENDING  Incomplete    Radiology Reports NM Pulmonary Perfusion  Result Date: 11/30/2020 CLINICAL DATA:  Shortness of breath EXAM: NUCLEAR MEDICINE PERFUSION LUNG SCAN TECHNIQUE: Perfusion images were obtained in multiple projections after intravenous injection of radiopharmaceutical. Views: Anterior, posterior, left lateral, right lateral, RPO, LPO, RAO, LAO RADIOPHARMACEUTICALS:  4.4 mCi Tc-57m MAA IV COMPARISON:  Chest radiograph November 29, 2020 FINDINGS: There is a segmental area of decreased uptake in the anterior segment of the right upper lobe. Scattered subsegmental defects noted  elsewhere. There is cardiomegaly. IMPRESSION: There is decreased radiotracer uptake in the anterior segment of the right upper lobe as well as scattered areas of decreased radiotracer uptake elsewhere. Pulmonary embolism cannot be excluded in this circumstance. This circumstance is felt to warrant consideration for CT angiogram chest to further evaluate. If there is a contraindication to contrast administration, it may be reasonable to consider lower extremity venous duplex ultrasound to assess for possible lower extremity deep venous thrombosis. Cardiomegaly noted. Electronically Signed   By: Lowella Grip III M.D.   On: 11/30/2020 07:51   US Venous Img Lower Bilateral (  DVT)  Result Date: 11/30/2020 CLINICAL DATA:  Bilateral leg pain and swelling. Right below-knee amputation. Left above knee amputation. EXAM: BILATERAL LOWER EXTREMITY VENOUS DOPPLER ULTRASOUND TECHNIQUE: Gray-scale sonography with graded compression, as well as color Doppler and duplex ultrasound were performed to evaluate the lower extremity deep venous systems from the level of the common femoral vein and including the common femoral, femoral, profunda femoral, popliteal and calf veins including the posterior tibial, peroneal and gastrocnemius veins when visible. The superficial great saphenous vein was also interrogated. Spectral Doppler was utilized to evaluate flow at rest and with distal augmentation maneuvers in the common femoral, femoral and popliteal veins. COMPARISON:  None. FINDINGS: RIGHT LOWER EXTREMITY Common Femoral Vein: No evidence of thrombus. Normal compressibility, respiratory phasicity and response to augmentation. Saphenofemoral Junction: No evidence of thrombus. Normal compressibility and flow on color Doppler imaging. Profunda Femoral Vein: No evidence of thrombus. Normal compressibility and flow on color Doppler imaging. Femoral Vein: No evidence of thrombus. Normal compressibility, respiratory phasicity and  response to augmentation. Popliteal Vein: No evidence of thrombus. Normal compressibility, respiratory phasicity and response to augmentation. Other Findings:  Limited exam due to tissue edema. LEFT LOWER EXTREMITY Common Femoral Vein: No evidence of thrombus. Normal compressibility, respiratory phasicity and response to augmentation. Saphenofemoral Junction: No evidence of thrombus. Normal compressibility and flow on color Doppler imaging. Profunda Femoral Vein: No evidence of thrombus. Normal compressibility and flow on color Doppler imaging. Femoral Vein: No evidence of thrombus. Normal compressibility, respiratory phasicity and response to augmentation. Limited exam due to soft tissue edema. Portable exam. IMPRESSION: No evidence of deep venous thrombosis in either lower extremity. Examination limited by tissue edema. Electronically Signed   By: Franchot Gallo M.D.   On: 11/30/2020 13:34   DG Chest Port 1 View  Result Date: 11/29/2020 CLINICAL DATA:  55 year female with shortness of breath. EXAM: PORTABLE CHEST 1 VIEW COMPARISON:  Chest radiograph dated 05/02/2018 FINDINGS: Dated there is cardiomegaly with mild vascular congestion. Left lung base linear atelectasis/scarring. No focal consolidation, pleural effusion or pneumothorax. No acute osseous pathology. IMPRESSION: Cardiomegaly with mild vascular congestion. No focal consolidation. Electronically Signed   By: Anner Crete M.D.   On: 11/29/2020 17:50   ECHOCARDIOGRAM COMPLETE  Result Date: 11/30/2020    ECHOCARDIOGRAM REPORT   Patient Name:   ADDIS BENNIE Dorey Date of Exam: 11/30/2020 Medical Rec #:  [id number]      Height:       67.0 in Accession #:    [id number]     Weight:       160.1 lb Date of Birth:  1965-12-12      BSA:          1.840 m Patient Age:    52 years       BP:           100/54 mmHg Patient Gender: F              HR:           86 bpm. Exam Location:  [facility] Procedure: 2D Echo Indications:    CHF-Acute Diastolic B44.96   History:        Patient has prior history of Echocardiogram examinations, most                 recent 12/16/2018. CHF, Arrythmias:Atrial Fibrillation; Risk                 Factors:Diabetes, Dyslipidemia and Hypertension.  Peripheral vascular disease, Acute Kidney Injury, Elevated BNP.  Sonographer:    Leavy Cella RDCS (AE) Referring Phys: [id number] OLADAPO ADEFESO IMPRESSIONS  1. Global hypokinesis with more prominent anteroseptal and apical hypokinesis. . Left ventricular ejection fraction, by estimation, is 35 to 40%. The left ventricle has moderately decreased function. The left ventricle demonstrates regional wall motion abnormalities (see scoring diagram/findings for description). Left ventricular diastolic parameters are indeterminate.  2. Right ventricular systolic function is normal. The right ventricular size is normal.  3. Left atrial size was severely dilated.  4. The pericardial effusion is circumferential.  5. The mitral valve is normal in structure. No evidence of mitral valve regurgitation. No evidence of mitral stenosis.  6. The aortic valve has an indeterminant number of cusps. Aortic valve regurgitation is not visualized. No aortic stenosis is present.  7. The inferior vena cava is normal in size with greater than 50% respiratory variability, suggesting right atrial pressure of 3 mmHg. FINDINGS  Left Ventricle: Global hypokinesis with more prominent anteroseptal and apical hypokinesis. Left ventricular ejection fraction, by estimation, is 35 to 40%. The left ventricle has moderately decreased function. The left ventricle demonstrates regional wall motion abnormalities. Definity contrast agent was given IV to delineate the left ventricular endocardial borders. The left ventricular internal cavity size was normal in size. There is no left ventricular hypertrophy. Left ventricular diastolic parameters are indeterminate. Right Ventricle: The right ventricular size is normal. No increase  in right ventricular wall thickness. Right ventricular systolic function is normal. Left Atrium: Left atrial size was severely dilated. Right Atrium: Right atrial size was normal in size. Pericardium: Trivial pericardial effusion is present. The pericardial effusion is circumferential. Mitral Valve: The mitral valve is normal in structure. There is mild thickening of the mitral valve leaflet(s). There is mild calcification of the mitral valve leaflet(s). Mild mitral annular calcification. No evidence of mitral valve regurgitation. No evidence of mitral valve stenosis. Tricuspid Valve: The tricuspid valve is normal in structure. Tricuspid valve regurgitation is not demonstrated. No evidence of tricuspid stenosis. Aortic Valve: The aortic valve has an indeterminant number of cusps. Aortic valve regurgitation is not visualized. No aortic stenosis is present. Aortic valve mean gradient measures 6.6 mmHg. Aortic valve peak gradient measures 13.8 mmHg. Aortic valve area, by VTI measures 1.28 cm. Pulmonic Valve: The pulmonic valve was not well visualized. Pulmonic valve regurgitation is not visualized. No evidence of pulmonic stenosis. Aorta: The aortic root is normal in size and structure. Pulmonary Artery: Indeterminant PASP, inadequate TR jet. Venous: The inferior vena cava is normal in size with greater than 50% respiratory variability, suggesting right atrial pressure of 3 mmHg. IAS/Shunts: No atrial level shunt detected by color flow Doppler.  LEFT VENTRICLE PLAX 2D LVIDd:         5.55 cm  Diastology LVIDs:         4.22 cm  LV e' medial:    4.78 cm/s LV PW:         1.00 cm  LV E/e' medial:  29.7 LV IVS:        0.95 cm  LV e' lateral:   7.05 cm/s LVOT diam:     1.80 cm  LV E/e' lateral: 20.1 LV SV:         48 LV SV Index:   26 LVOT Area:     2.54 cm  RIGHT VENTRICLE RV S prime:     10.80 cm/s LEFT ATRIUM  Index       RIGHT ATRIUM           Index LA diam:        4.50 cm 2.45 cm/m  RA Area:     18.60 cm  LA Vol (A2C):   64.8 ml 35.23 ml/m RA Volume:   54.40 ml  29.57 ml/m LA Vol (A4C):   85.6 ml 46.53 ml/m LA Biplane Vol: 77.2 ml 41.97 ml/m  AORTIC VALVE AV Area (Vmax):    1.17 cm AV Area (Vmean):   1.30 cm AV Area (VTI):     1.28 cm AV Vmax:           185.81 cm/s AV Vmean:          120.834 cm/s AV VTI:            0.370 m AV Peak Grad:      13.8 mmHg AV Mean Grad:      6.6 mmHg LVOT Vmax:         85.33 cm/s LVOT Vmean:        61.694 cm/s LVOT VTI:          0.187 m LVOT/AV VTI ratio: 0.50  AORTA Ao Root diam: 2.80 cm MITRAL VALVE MV Area (PHT): 3.16 cm     SHUNTS MV Decel Time: 240 msec     Systemic VTI:  0.19 m MV E velocity: 142.00 cm/s  Systemic Diam: 1.80 cm MV A velocity: 90.80 cm/s MV E/A ratio:  1.56 Carlyle Dolly MD Electronically signed by Carlyle Dolly MD Signature Date/Time: 11/30/2020/12:06:11 PM    Final      CBC Recent Labs  Lab 11/29/20 1730 11/30/20 0452  WBC 3.6* 3.7*  HGB 9.5* 9.0*  HCT 32.7* 31.2*  PLT 237 221  MCV 89.8 90.2  MCH 26.1 26.0  MCHC 29.1* 28.8*  RDW 18.3* 18.1*  LYMPHSABS 0.8  --   MONOABS 0.3  --   EOSABS 0.0  --   BASOSABS 0.0  --     Chemistries  Recent Labs  Lab 11/29/20 1730 11/30/20 0452  NA 137 138  K 3.5 3.5  CL 108 106  CO2 18* 22  GLUCOSE 79 172*  BUN 31* 33*  CREATININE 2.10* 2.02*  CALCIUM 7.8* 7.9*  MG  --  1.4*  AST 15 9*  ALT 8 9  ALKPHOS 84 80  BILITOT 0.4 0.6   ------------------------------------------------------------------------------------------------------------------ Recent Labs    11/29/20 1730  TRIG 119    Lab Results  Component Value Date   HGBA1C 5.7 (H) 11/29/2020   ------------------------------------------------------------------------------------------------------------------ No results for input(s): TSH, T4TOTAL, T3FREE, THYROIDAB in the last 72 hours.  Invalid input(s):  FREET3 ------------------------------------------------------------------------------------------------------------------ Recent Labs    11/29/20 1730  FERRITIN 84    Coagulation profile Recent Labs  Lab 11/30/20 0452  INR 1.4*    Recent Labs    11/29/20 1730  DDIMER 4.75*    Cardiac Enzymes No results for input(s): CKMB, TROPONINI, MYOGLOBIN in the last 168 hours.  Invalid input(s): CK ------------------------------------------------------------------------------------------------------------------    Component Value Date/Time   BNP 2,629.0 (H) 11/29/2020 1730     Roxan Hockey M.D on 11/30/2020 at 4:58 PM  Go to [email or web address] - for contact info  [name] - Office  [phone]

## 2020-11-30 NOTE — ED Notes (Signed)
Pt was complaining about being on her pressure points on her bottom which were causing her pain. Pt was repositioned on her right side with pillow support. Pt was told to call out if she needs to be repositioned to her left side with pillow support.

## 2020-11-30 NOTE — Plan of Care (Signed)
  Problem: Acute Rehab PT Goals(only PT should resolve) Goal: Pt will Roll Supine to Side Outcome: Progressing Flowsheets (Taken 11/30/2020 1531) Pt will Roll Supine to Side: with modified independence Goal: Pt Will Go Supine/Side To Sit Outcome: Progressing Flowsheets (Taken 11/30/2020 1531) Pt will go Supine/Side to Sit: with modified independence Goal: Pt Will Go Sit To Supine/Side Outcome: Progressing Flowsheets (Taken 11/30/2020 1531) Pt will go Sit to Supine/Side: with modified independence Goal: Patient Will Perform Sitting Balance Outcome: Progressing Flowsheets (Taken 11/30/2020 1531) Patient will perform sitting balance: Independently Goal: Pt Will Transfer Bed To Chair/Chair To Bed Outcome: Progressing Flowsheets (Taken 11/30/2020 1531) Pt will Transfer Bed to Chair/Chair to Bed:  with supervision  min guard assist   3:32 PM, 11/30/20 [name], MPT Physical Therapist with [facility] 336 [phone] office [phone] mobile phone

## 2020-11-30 NOTE — ED Notes (Signed)
Patient transported to CT 

## 2020-11-30 NOTE — NC FL2 (Signed)
[place] LEVEL OF CARE SCREENING TOOL     IDENTIFICATION  Patient Name: [name] Birthdate: 07-26-1966 Sex: female Admission Date (Current Location): 11/29/2020  [facility] and [place] Number:  [employer] and Address:  [facility] [address], [place]      Provider Number: [id number]  Attending Physician Name and Address:  Roxan Hockey, MD  Relative Name and Phone Number:  Maryclaire Stoecker [phone]    Current Level of Care: Hospital Recommended Level of Care: [place] Prior Approval Number:    Date Approved/Denied:   PASRR Number: [id number]  Discharge Plan: SNF    Current Diagnoses: Patient Active Problem List   Diagnosis Date Noted  . Acute exacerbation of CHF (congestive heart failure) ([place]) 11/29/2020  . Leukopenia 11/29/2020  . Elevated brain natriuretic peptide (BNP) level 11/29/2020  . Elevated d-dimer 11/29/2020  . Prolonged QT interval 11/29/2020  . Foot osteomyelitis, right ([place]) 10/06/2020  . Stable treated proliferative diabetic retinopathy of left eye determined by examination associated with type 2 diabetes mellitus ([place]) 08/14/2020  . Controlled type 2 diabetes mellitus with left eye affected by proliferative retinopathy with traction retinal detachment not involving macula, with long-term current use of insulin ([place]) 02/24/2020  . Macular pucker, left eye 02/24/2020  . Follow-up examination after eye surgery 02/24/2020  . Coronary artery disease involving native coronary artery of native heart without angina pectoris 11/21/2018  . Ischemic cardiomyopathy 11/21/2018  . Chronic systolic heart failure ([place]) 11/21/2018  . Persistent atrial fibrillation ([place]) 11/21/2018  . Chronic kidney disease 11/21/2018  . Essential hypertension 11/21/2018  . Abnormality of gait 07/29/2018  . Neuropathic pain   . Labile blood pressure   . Diabetic retinopathy associated with type 2  diabetes mellitus ([place])   . Hypoalbuminemia due to protein-calorie malnutrition ([place])   . Type 2 diabetes mellitus with peripheral neuropathy (HCC)   . Chronic diastolic congestive heart failure ([place])   . Above knee amputation status, left 05/22/2018  . AKI (acute kidney injury) ([place])   . Unilateral AKA, left ([place])   . ATN (acute tubular necrosis) ([place])   . Atrial fibrillation with rapid ventricular response ([place])   . Diabetic peripheral neuropathy ([place])   . Acute blood loss anemia   . Anemia of chronic disease   . Hyperlipidemia   . Gastroesophageal reflux disease   . Non-ST elevation (NSTEMI) myocardial infarction ([place])   . Acute combined systolic and diastolic heart failure ([place])   . Acute encephalopathy   . Acute renal failure ([place])   . Acute respiratory failure with hypoxia ([place])   . Sepsis ([place])   . Lower limb ischemia 04/25/2018  . Microcytic anemia 04/25/2018  . Pulmonary nodule 04/25/2018  . Gangrene of lower extremity ([place]) 04/25/2018  . Femoral-popliteal bypass graft occlusion, left ([place]) 12/02/2017  . Respiratory failure with hypoxia ([place]) 12/02/2017  . Leukocytosis 12/02/2017  . Paroxysmal atrial fibrillation with rapid ventricular response ([place]) 12/02/2017  . Thrombosis 11/24/2017  . Hyperkalemia 06/26/2017  . Atherosclerosis of native artery of right lower extremity with ulceration of ankle ([place]) 05/13/2017  . Osteomyelitis of right fibula ([place]) 03/05/2017  . Non-pressure chronic ulcer of right ankle limited to breakdown of skin ([place]) 01/17/2017  . Elevated lactic acid level   . Osteomyelitis of ankle ([place]) 12/29/2016  . Chronic pain 12/24/2016  . Buttock wound, left, subsequent encounter   . Hypotension 12/23/2016  . IDDM (insulin dependent diabetes mellitus) 12/23/2016  .  Acute kidney injury ([place]) 12/23/2016  . Malnutrition ([place]) 12/23/2016  . Thrush 12/23/2016  . Septic shock ([place]) 11/27/2016  . DM2 (diabetes mellitus, type 2) ([place]) 11/27/2016  . Chronic  back pain 11/27/2016  . Orthostatic hypotension 11/27/2016  . Demand ischemia ([place])   . Hyperlipidemia LDL goal <70   . Right sided weakness   . Retinal tumor of right eye   . Pressure injury of skin 10/01/2016  . Necrotizing fasciitis ([place]) 09/30/2016  . 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 Paresthesia of both hands 03/05/2016  . DDD (degenerative disc disease), lumbosacral 03/26/2015  . DDD (degenerative disc disease), cervical 03/26/2015  . Sacroiliac joint disease 03/26/2015  . Occipital neuralgia 03/26/2015  . Pain in limb-Bilateral Leg 04/25/2014  . Peripheral vascular disease ([place]) 04/05/2013  . Chronic total occlusion of artery of the extremities ([place]) 02/10/2012  . Peripheral vascular disease, unspecified ([place]) 02/10/2012    Orientation RESPIRATION BLADDER Height & Weight     Self,Time,Situation,Place  O2 Continent,External catheter Weight: 160 lb 0.9 oz (72.6 kg) Height:  5\' 7"  (170.2 cm)  BEHAVIORAL SYMPTOMS/MOOD NEUROLOGICAL BOWEL NUTRITION STATUS      Continent    AMBULATORY STATUS COMMUNICATION OF NEEDS Skin   Extensive Assist Verbally Normal                       Personal Care Assistance Level of Assistance  Bathing,Feeding,Dressing,Total care Bathing Assistance: Maximum assistance Feeding assistance: Independent Dressing Assistance: Maximum assistance Total Care Assistance: Limited assistance   Functional Limitations Info  Sight,Hearing,Speech Sight Info: Impaired Hearing Info: Adequate Speech Info: Adequate    SPECIAL CARE FACTORS FREQUENCY  PT (By licensed PT),OT (By licensed OT)     PT Frequency: 5 times weekly OT Frequency: 5 times weekly            Contractures Contractures Info: Not present    Additional Factors Info  Code Status,Allergies Code Status Info: FULL Allergies Info: Lactose Intolerance (Gi)           Current Medications (11/30/2020):  This is the current hospital active medication list Current  Facility-Administered Medications  Medication Dose Route Frequency Provider Last Rate Last Admin  . cefTRIAXone (ROCEPHIN) 1 g in sodium chloride 0.9 % 100 mL IVPB  1 g Intravenous Q24H Emokpae, Courage, MD 200 mL/hr at 11/30/20 1748 1 g at 11/30/20 1748  . furosemide (LASIX) injection 40 mg  40 mg Intravenous Q12H Adefeso, Oladapo, DO      . insulin aspart (novoLOG) injection 0-5 Units  0-5 Units Subcutaneous QHS Adefeso, Oladapo, DO      . insulin aspart (novoLOG) injection 0-9 Units  0-9 Units Subcutaneous TID WC Adefeso, Oladapo, DO   1 Units at 11/30/20 1625  . oxyCODONE (Oxy IR/ROXICODONE) immediate release tablet 5 mg  5 mg Oral Q4H PRN Emokpae, Courage, MD      . pantoprazole (PROTONIX) EC tablet 40 mg  40 mg Oral Daily Adefeso, Oladapo, DO   40 mg at 11/30/20 0949  . pregabalin (LYRICA) capsule 50 mg  50 mg Oral BID Emokpae, Courage, MD      . Rivaroxaban (XARELTO) tablet 15 mg  15 mg Oral Q supper [place] Brick, Courage, MD   15 mg at 11/30/20 1624  . rosuvastatin (CRESTOR) tablet 20 mg  20 mg Oral Daily Emokpae, Courage, MD   20 mg at 11/30/20 1744  . sodium bicarbonate tablet 650 mg  650 mg Oral BID Roxan Hockey, MD  Discharge Medications: Please see discharge summary for a list of discharge medications.  Relevant Imaging Results:  Relevant Lab Results:   Additional Information SSN: [id number] [place]

## 2020-11-30 NOTE — Progress Notes (Signed)
*  PRELIMINARY RESULTS* Echocardiogram 2D Echocardiogram with definity has been performed.  Leavy Cella 11/30/2020, 11:42 AM

## 2020-11-30 NOTE — ED Notes (Signed)
Dr. Joesph Fillers aware that IV lasix not given

## 2020-11-30 NOTE — ED Notes (Signed)
PT given meal tray.

## 2020-11-30 NOTE — ED Notes (Signed)
Coffee given, assisted in preparing with milk and splenda

## 2020-12-01 DIAGNOSIS — I5023 Acute on chronic systolic (congestive) heart failure: Secondary | ICD-10-CM

## 2020-12-01 DIAGNOSIS — N184 Chronic kidney disease, stage 4 (severe): Secondary | ICD-10-CM

## 2020-12-01 DIAGNOSIS — G8929 Other chronic pain: Secondary | ICD-10-CM

## 2020-12-01 DIAGNOSIS — M545 Low back pain, unspecified: Secondary | ICD-10-CM

## 2020-12-01 DIAGNOSIS — R778 Other specified abnormalities of plasma proteins: Secondary | ICD-10-CM

## 2020-12-01 DIAGNOSIS — I4891 Unspecified atrial fibrillation: Secondary | ICD-10-CM

## 2020-12-01 DIAGNOSIS — I5043 Acute on chronic combined systolic (congestive) and diastolic (congestive) heart failure: Secondary | ICD-10-CM

## 2020-12-01 LAB — GLUCOSE, CAPILLARY
Glucose-Capillary: 104 mg/dL — ABNORMAL HIGH (ref 70–99)
Glucose-Capillary: 110 mg/dL — ABNORMAL HIGH (ref 70–99)
Glucose-Capillary: 148 mg/dL — ABNORMAL HIGH (ref 70–99)
Glucose-Capillary: 120 mg/dL — ABNORMAL HIGH (ref 70–99)

## 2020-12-01 LAB — TROPONIN I (HIGH SENSITIVITY)
Troponin I (High Sensitivity): 85 ng/L — ABNORMAL HIGH (ref <18)
Troponin I (High Sensitivity): 82 ng/L — ABNORMAL HIGH (ref <18)

## 2020-12-01 LAB — CBC
HCT: 33.4 % — ABNORMAL LOW (ref 36.0–46.0)
Hemoglobin: 9.6 g/dL — ABNORMAL LOW (ref 12.0–15.0)
MCH: 26 pg (ref 26.0–34.0)
MCHC: 28.7 g/dL — ABNORMAL LOW (ref 30.0–36.0)
MCV: 90.5 fL (ref 80.0–100.0)
Platelets: 228 10*3/uL (ref 150–400)
RBC: 3.69 MIL/uL — ABNORMAL LOW (ref 3.87–5.11)
RDW: 17.9 % — ABNORMAL HIGH (ref 11.5–15.5)
WBC: 7.2 10*3/uL (ref 4.0–10.5)
nRBC: 0 % (ref 0.0–0.2)

## 2020-12-01 LAB — RENAL FUNCTION PANEL
Albumin: 2.8 g/dL — ABNORMAL LOW (ref 3.5–5.0)
Anion gap: 11 (ref 5–15)
BUN: 34 mg/dL — ABNORMAL HIGH (ref 6–20)
CO2: 22 mmol/L (ref 22–32)
Calcium: 7.9 mg/dL — ABNORMAL LOW (ref 8.9–10.3)
Chloride: 104 mmol/L (ref 98–111)
Creatinine, Ser: 2.14 mg/dL — ABNORMAL HIGH (ref 0.44–1.00)
GFR, Estimated: 27 mL/min — ABNORMAL LOW (ref >60)
Glucose, Bld: 124 mg/dL — ABNORMAL HIGH (ref 70–99)
Phosphorus: 5.6 mg/dL — ABNORMAL HIGH (ref 2.5–4.6)
Potassium: 3.2 mmol/L — ABNORMAL LOW (ref 3.5–5.1)
Sodium: 137 mmol/L (ref 135–145)

## 2020-12-01 LAB — BRAIN NATRIURETIC PEPTIDE: B Natriuretic Peptide: 2905 pg/mL — ABNORMAL HIGH (ref 0.0–100.0)

## 2020-12-01 LAB — MAGNESIUM: Magnesium: 1.3 mg/dL — ABNORMAL LOW (ref 1.7–2.4)

## 2020-12-01 MED ORDER — POTASSIUM CHLORIDE CRYS ER 20 MEQ PO TBCR
40.0000 meq | EXTENDED_RELEASE_TABLET | ORAL | Status: AC
  Administered 2020-12-01 (×2): 40 meq via ORAL
  Filled 2020-12-01 (×2): qty 2

## 2020-12-01 MED ORDER — METOPROLOL TARTRATE 5 MG/5ML IV SOLN
2.5000 mg | Freq: Once | INTRAVENOUS | Status: AC
  Administered 2020-12-01: 2.5 mg via INTRAVENOUS
  Filled 2020-12-01: qty 5

## 2020-12-01 MED ORDER — ALBUTEROL SULFATE (2.5 MG/3ML) 0.083% IN NEBU
INHALATION_SOLUTION | RESPIRATORY_TRACT | Status: AC
  Administered 2020-12-01: 2.5 mg
  Filled 2020-12-01: qty 3

## 2020-12-01 MED ORDER — AMIODARONE HCL 200 MG PO TABS
400.0000 mg | ORAL_TABLET | Freq: Two times a day (BID) | ORAL | Status: DC
  Administered 2020-12-01 – 2020-12-02 (×2): 400 mg via ORAL
  Filled 2020-12-01 (×3): qty 2

## 2020-12-01 MED ORDER — MAGNESIUM SULFATE 4 GM/100ML IV SOLN
4.0000 g | Freq: Once | INTRAVENOUS | Status: AC
  Administered 2020-12-01: 4 g via INTRAVENOUS
  Filled 2020-12-01: qty 100

## 2020-12-01 NOTE — Progress Notes (Signed)
Physical Therapy Treatment Patient Details Name: [name] MRN: [id number] DOB: 1966-08-16 Today's Date: 12/01/2020    History of Present Illness [name] is a 55 y.o. female with medical history significant for  type 2 diabetes, diabetic polyneuropathy, GERD, hyperlipidemia, paroxysmal A. fib, ischemic cardiomyopathy, peripheral vascular disease post left above-the-knee amputation and Right BKA who presents to the emergency department via EMS accompanied by her husband due to 1 week of cough (occasionally productive with clear sputum), chest congestion, nasal congestion and 3-day onset of shortness of breath.  Patient takes Lasix at baseline and she states that the dosage was being changed (recently reduced to 20 Mg from 80 mg due to worsening kidney function), otherwise she states that she has been compliant with the Lasix.  She also complains of increased abdominal girth which she states was possibly due to fluid accommodation.  Home PT checked O2 sat today and was in the high 70s, this was re- checked by husband and was noted to be in the 16s, EMS was activated, and patient was sent to the ED for further evaluation. She is wheelchair-bound due to bilateral lower extremity amputation.    PT Comments    Patient seated in chair with RN present at beginning of session and patient was eager to get back to bed due to back pain. Patient requires assist for positioning to prepare for lateral scoot. Patient requires max assist +2 to scoot from recliner to bed secondary to fatigue and weakness. Patient required assist to be repositioned in bed with SOB following transfer. Patient will benefit from continued physical therapy in hospital and recommended venue below to increase strength, balance, endurance for safe ADLs and gait.    Follow Up Recommendations  SNF;Supervision for mobility/OOB;Supervision - Intermittent     Equipment Recommendations  None recommended by PT    Recommendations for  Other Services       Precautions / Restrictions Precautions Precautions: Fall Restrictions Weight Bearing Restrictions: No    Mobility  Bed Mobility Overal bed mobility: Needs Assistance Bed Mobility: Supine to Sit;Sit to Supine     Supine to sit: Mod assist;+2 for physical assistance Sit to supine: Supervision   General bed mobility comments: had to use bilateral bedrails to pull self to long sitting  Transfers Overall transfer level: Needs assistance   Transfers: Lateral/Scoot Transfers          Lateral/Scoot Transfers: Max assist;+2 physical assistance;+2 safety/equipment General transfer comment: scoot patient from recliner to bed, patient required max +2 due to fatigue  Ambulation/Gait                 Stairs             Wheelchair Mobility    Modified Rankin (Stroke Patients Only)       Balance                                            Cognition Arousal/Alertness: Awake/alert Behavior During Therapy: WFL for tasks assessed/performed Overall Cognitive Status: Within Functional Limits for tasks assessed                                        Exercises      General Comments        Pertinent  Vitals/Pain Pain Assessment: 0-10 Pain Score: 7  Pain Location: chronic low back Pain Descriptors / Indicators: Aching;Sore;Grimacing Pain Intervention(s): Limited activity within patient's tolerance;Monitored during session;Repositioned    Home Living Family/patient expects to be discharged to:: Private residence Living Arrangements: Spouse/significant other Available Help at Discharge: Family;Available PRN/intermittently Type of Home: House Home Access: Ramped entrance   Home Layout: One level Home Equipment: Bedside commode;Wheelchair - manual;Electric scooter;Hospital bed;Shower seat Additional Comments: has L prosthethic-never used, uses bed pan, sponge bathes    Prior Function Level of  Independence: Needs assistance  Gait / Transfers Assistance Needed: Mod Independent transfers using sliding board or scooting into w/c, non-ambulatory ADL's / Homemaking Assistance Needed: assisted by family-husband assists with dressing, bathing, meal prep     PT Goals (current goals can now be found in the care plan section) Acute Rehab PT Goals Patient Stated Goal: go to rehab PT Goal Formulation: With patient Time For Goal Achievement: 12/14/20 Potential to Achieve Goals: Good Progress towards PT goals: Progressing toward goals    Frequency    Min 3X/week      PT Plan Current plan remains appropriate    Co-evaluation              AM-PAC PT "6 Clicks" Mobility   Outcome Measure  Help needed turning from your back to your side while in a flat bed without using bedrails?: A Little Help needed moving from lying on your back to sitting on the side of a flat bed without using bedrails?: A Little Help needed moving to and from a bed to a chair (including a wheelchair)?: A Lot Help needed standing up from a chair using your arms (e.g., wheelchair or bedside chair)?: Total Help needed to walk in hospital room?: Total Help needed climbing 3-5 steps with a railing? : Total 6 Click Score: 11    End of Session Equipment Utilized During Treatment: Oxygen Activity Tolerance: Patient limited by fatigue;Patient limited by pain Patient left: in bed;with call bell/phone within reach Nurse Communication: Mobility status PT Visit Diagnosis: Muscle weakness (generalized) (M62.81);Pain;Difficulty in walking, not elsewhere classified (R26.2)     Time: 9371-6967 PT Time Calculation (min) (ACUTE ONLY): 15 min  Charges:  $Therapeutic Activity: 8-22 mins                     10:53 AM, 12/01/20 Mearl Latin PT, DPT Physical Therapist at [facility]

## 2020-12-01 NOTE — Progress Notes (Signed)
Patient Demographics:    [name], is a 55 y.o. female, DOB - 02/07/1966, YKZ:[id number]  Admit date - 11/29/2020   Admitting Physician Arnoldo Lenis, MD  Outpatient Primary MD for the patient is Adamo, [name], MD  LOS - 2  Chief Complaint  Patient presents with  . Shortness of Breath        Subjective:    [name] today has no fevers, no emesis,  No chest pain,  C/o shob,  -No cough -Urine output appears adequate  Assessment  & Plan :    Principal Problem:   Acute exacerbation of CHF (congestive heart failure) (HCC) Active Problems:   Peripheral vascular disease, unspecified (HCC)   Hyperlipidemia LDL goal <70   DM2 (diabetes mellitus, type 2) (HCC)   Chronic back pain   Acute respiratory failure with hypoxia (HCC)   Atrial fibrillation with rapid ventricular response (HCC)   Hyperlipidemia   Gastroesophageal reflux disease   Chronic kidney disease   Essential hypertension   Leukopenia   Elevated brain natriuretic peptide (BNP) level   Elevated d-dimer   Prolonged QT interval  Brief Summary:-  55 y.o. female with medical history significant for type 2 diabetes, diabetic polyneuropathy, GERD, hyperlipidemia, paroxysmal A. fib, ischemic cardiomyopathy, peripheral vascular disease post left above-the-knee amputation and Right BKA admitted on 11/29/2020 with acute hypoxic respiratory failure secondary to acute on chronic CHF  A/p 1)HFrEF--patient with acute on chronic combined systolic and diastolic dysfunction CHF  -BNP 2629, Cxr with pulmonary venous congestion and cardiomegaly -- Repeat echo on 12/01/2019 with EF of 35 to 40% from 50 to 55% back in January 2020 with regional wall motion of normalities, -Echo from January 2020 also had grade 1 diastolic dysfunction -Patient declines LHC or other ischemia work-up -Continue IV Lasix, daily fluid input and output monitoring,  daily weight, -Cardiology consult/input appreciated -Soft BP precludes nitrates/hydralazine -avoid ACEI/ARB/ARNI/digoxin due to renal concerns   2) elevated D-dimer-- D-Dimer 4.75, PTA patient was on Xarelto for atrial fibrillation, VQ scan without definite PE findings -Lower Extremity venous Dopplers without acute DVT -Renal function precludes CTA chest -Patient received therapeutic Lovenox on admission,  --continue Xarelto  3) chronic atrial fibrillation--now with RVR, will try to restart metoprolol if BP allows . continue Xarelto for stroke prophylaxis -Cardiologist recommends adding amiodarone  4)CKD IV---creatinine is currently 2.0 which is actually lower than previous baseline suspect some component of hemodilution due to CHF -renally adjust medications, avoid nephrotoxic agents / dehydration  / hypotension -Continue bicarb tablets  5) ambulatory dysfunction/wheelchair-bound status due to bilateral amputations--- patient with right BKA and left AKA----husband is primary caregiver -Physical therapy evaluation appreciated recommends SNF rehab  6)Dm2-A1c is 5.7 reflecting excellent diabetic control PTA Use Novolog/Humalog Sliding scale insulin with Accu-Cheks/Fingersticks as ordered   7)PAD--status post prior right BKA and left AKA, continue Xarelto, and crestor  8) possible UTI--- IV Rocephin pending culture  9) acute hypoxic respiratory failure--- secondary to #1 above, PE less likely, -Should improve with diuresis -Currently on 2 L of oxygen via nasal  10)Presumed CAD--elevated troponin, chest pain-free, unable to give aspirin due to Xarelto use-we will restart metoprolol if BP allows, drop in EF noted on echo, wall motion normalities noted on echo --Patient  declines LHC or other ischemia work-up -  Disposition/Need for in-Hospital Stay- patient unable to be discharged at this time due to -acute on chronic CHF exacerbation requiring IV diuresis, soft BP requiring close  monitoring -UTI requiring IV antibiotics*  Status is: Inpatient  Remains inpatient appropriate because:Please see above   Disposition: The patient is from: Home              Anticipated d/c is to: SNF  Vs Home with [facility]              Anticipated d/c date is: 2 days              Patient currently is not medically stable to d/c. Barriers: Not Clinically Stable-   Code Status :  -  Code Status: Full Code   Family Communication:    NA (patient is alert, awake and coherent)   Consults  :  Cardi  DVT Prophylaxis  :   - SCDs *  SCDs Start: 11/29/20 1954 Rivaroxaban (XARELTO) tablet 15 mg    Lab Results  Component Value Date   PLT 228 12/01/2020    Inpatient Medications  Scheduled Meds: . amiodarone  400 mg Oral BID  . furosemide  40 mg Intravenous Q12H  . insulin aspart  0-5 Units Subcutaneous QHS  . insulin aspart  0-9 Units Subcutaneous TID WC  . pantoprazole  40 mg Oral Daily  . pregabalin  50 mg Oral BID  . Rivaroxaban  15 mg Oral Q supper  . rosuvastatin  20 mg Oral Daily  . sodium bicarbonate  650 mg Oral BID   Continuous Infusions: . cefTRIAXone (ROCEPHIN)  IV 1 g (12/01/20 1802)   PRN Meds:.  Anti-infectives (From admission, onward)   Start     Dose/Rate Route Frequency Ordered Stop   11/30/20 1800  cefTRIAXone (ROCEPHIN) 1 g in sodium chloride 0.9 % 100 mL IVPB        1 g 200 mL/hr over 30 Minutes Intravenous Every 24 hours 11/30/20 1713          Objective:   Vitals:   11/30/20 1957 11/30/20 2047 12/01/20 0206 12/01/20 0504  BP: 96/80  (!) 101/51 110/65  Pulse: 84  91 85  Resp: 19  16 19   Temp: 98.2 F (36.8 C)  98 F (36.7 C) 98 F (36.7 C)  TempSrc:      SpO2: 100% 99% 93% 99%  Weight:      Height:        Wt Readings from Last 3 Encounters:  11/29/20 72.6 kg  10/06/20 72.6 kg  01/22/19 72.8 kg     Intake/Output Summary (Last 24 hours) at 12/01/2020 1955 Last data filed at 12/01/2020 1830 Gross per 24 hour  Intake 720 ml  Output 1800 ml   Net -1080 ml    Physical Exam  Gen:- Awake Alert, no conversational dyspnea HEENT:- [place].AT, No sclera icterus, legally blind Neck-Supple Neck,No JVD,.  Lungs-diminished in bases, faint bibasilar Rales CV- S1, S2 normal, irregular  Abd-  +ve B.Sounds, Abd Soft, No tenderness,    Extremity/Skin:-Rt BKA, Lt AKA  Psych-affect is appropriate, oriented x3 Neuro-lower extremity amputations, no new focal deficits, no tremors   Data Review:   Micro Results Recent Results (from the past 240 hour(s))  Blood Culture (routine x 2)     Status: None (Preliminary result)   Collection Time: 11/29/20  5:30 PM   Specimen: BLOOD  Result Value Ref Range Status   Specimen  Description BLOOD RIGHT ANTECUBITAL  Final   Special Requests   Final    BOTTLES DRAWN AEROBIC AND ANAEROBIC Blood Culture results may not be optimal due to an inadequate volume of blood received in culture bottles   Culture   Final    NO GROWTH 2 DAYS Performed at [facility], [address], [place], [place] [address]    Report Status PENDING  Incomplete  Resp Panel by RT-PCR (Flu A&B, Covid) Nasopharyngeal Swab     Status: None   Collection Time: 11/29/20  5:52 PM   Specimen: Nasopharyngeal Swab; Nasopharyngeal(NP) swabs in vial transport medium  Result Value Ref Range Status   SARS Coronavirus 2 by RT PCR NEGATIVE NEGATIVE Final    Comment: (NOTE) SARS-CoV-2 target nucleic acids are NOT DETECTED.  The SARS-CoV-2 RNA is generally detectable in upper respiratory specimens during the acute phase of infection. The lowest concentration of SARS-CoV-2 viral copies this assay can detect is 138 copies/mL. A negative result does not preclude SARS-Cov-2 infection and should not be used as the sole basis for treatment or other patient management decisions. A negative result may occur with  improper specimen collection/handling, submission of specimen other than nasopharyngeal swab, presence of viral mutation(s) within the areas  targeted by this assay, and inadequate number of viral copies(<138 copies/mL). A negative result must be combined with clinical observations, patient history, and epidemiological information. The expected result is Negative.  Fact Sheet for Patients:  EntrepreneurPulse.com.au  Fact Sheet for Healthcare Providers:  IncredibleEmployment.be  This test is no t yet approved or cleared by the [place] FDA and  has been authorized for detection and/or diagnosis of SARS-CoV-2 by FDA under an Emergency Use Authorization (EUA). This EUA will remain  in effect (meaning this test can be used) for the duration of the COVID-19 declaration under Section 564(b)(1) of the Act, 21 U.S.C.section 360bbb-3(b)(1), unless the authorization is terminated  or revoked sooner.       Influenza A by PCR NEGATIVE NEGATIVE Final   Influenza B by PCR NEGATIVE NEGATIVE Final    Comment: (NOTE) The Xpert Xpress SARS-CoV-2/FLU/RSV plus assay is intended as an aid in the diagnosis of influenza from Nasopharyngeal swab specimens and should not be used as a sole basis for treatment. Nasal washings and aspirates are unacceptable for Xpert Xpress SARS-CoV-2/FLU/RSV testing.  Fact Sheet for Patients: EntrepreneurPulse.com.au  Fact Sheet for Healthcare Providers: IncredibleEmployment.be  This test is not yet approved or cleared by the [place] FDA and has been authorized for detection and/or diagnosis of SARS-CoV-2 by FDA under an Emergency Use Authorization (EUA). This EUA will remain in effect (meaning this test can be used) for the duration of the COVID-19 declaration under Section 564(b)(1) of the Act, 21 U.S.C. section 360bbb-3(b)(1), unless the authorization is terminated or revoked.  Performed at [facility], [address] [place], [place] [address]   Blood Culture (routine x 2)     Status: None (Preliminary result)    Collection Time: 11/29/20  8:02 PM   Specimen: BLOOD RIGHT ARM  Result Value Ref Range Status   Specimen Description BLOOD RIGHT ARM  Final   Special Requests   Final    BOTTLES DRAWN AEROBIC ONLY Blood Culture results may not be optimal due to an inadequate volume of blood received in culture bottles   Culture   Final    NO GROWTH 2 DAYS Performed at [facility], [address], [place], [place] [address]    Report Status  PENDING  Incomplete    Radiology Reports NM Pulmonary Perfusion  Result Date: 11/30/2020 CLINICAL DATA:  Shortness of breath EXAM: NUCLEAR MEDICINE PERFUSION LUNG SCAN TECHNIQUE: Perfusion images were obtained in multiple projections after intravenous injection of radiopharmaceutical. Views: Anterior, posterior, left lateral, right lateral, RPO, LPO, RAO, LAO RADIOPHARMACEUTICALS:  4.4 mCi Tc-30m MAA IV COMPARISON:  Chest radiograph November 29, 2020 FINDINGS: There is a segmental area of decreased uptake in the anterior segment of the right upper lobe. Scattered subsegmental defects noted elsewhere. There is cardiomegaly. IMPRESSION: There is decreased radiotracer uptake in the anterior segment of the right upper lobe as well as scattered areas of decreased radiotracer uptake elsewhere. Pulmonary embolism cannot be excluded in this circumstance. This circumstance is felt to warrant consideration for CT angiogram chest to further evaluate. If there is a contraindication to contrast administration, it may be reasonable to consider lower extremity venous duplex ultrasound to assess for possible lower extremity deep venous thrombosis. Cardiomegaly noted. Electronically Signed   By: Lowella Grip III M.D.   On: 11/30/2020 07:51   US Venous Img Lower Bilateral (DVT)  Result Date: 11/30/2020 CLINICAL DATA:  Bilateral leg pain and swelling. Right below-knee amputation. Left above knee amputation. EXAM: BILATERAL LOWER EXTREMITY VENOUS DOPPLER ULTRASOUND TECHNIQUE: Gray-scale  sonography with graded compression, as well as color Doppler and duplex ultrasound were performed to evaluate the lower extremity deep venous systems from the level of the common femoral vein and including the common femoral, femoral, profunda femoral, popliteal and calf veins including the posterior tibial, peroneal and gastrocnemius veins when visible. The superficial great saphenous vein was also interrogated. Spectral Doppler was utilized to evaluate flow at rest and with distal augmentation maneuvers in the common femoral, femoral and popliteal veins. COMPARISON:  None. FINDINGS: RIGHT LOWER EXTREMITY Common Femoral Vein: No evidence of thrombus. Normal compressibility, respiratory phasicity and response to augmentation. Saphenofemoral Junction: No evidence of thrombus. Normal compressibility and flow on color Doppler imaging. Profunda Femoral Vein: No evidence of thrombus. Normal compressibility and flow on color Doppler imaging. Femoral Vein: No evidence of thrombus. Normal compressibility, respiratory phasicity and response to augmentation. Popliteal Vein: No evidence of thrombus. Normal compressibility, respiratory phasicity and response to augmentation. Other Findings:  Limited exam due to tissue edema. LEFT LOWER EXTREMITY Common Femoral Vein: No evidence of thrombus. Normal compressibility, respiratory phasicity and response to augmentation. Saphenofemoral Junction: No evidence of thrombus. Normal compressibility and flow on color Doppler imaging. Profunda Femoral Vein: No evidence of thrombus. Normal compressibility and flow on color Doppler imaging. Femoral Vein: No evidence of thrombus. Normal compressibility, respiratory phasicity and response to augmentation. Limited exam due to soft tissue edema. Portable exam. IMPRESSION: No evidence of deep venous thrombosis in either lower extremity. Examination limited by tissue edema. Electronically Signed   By: Franchot Gallo M.D.   On: 11/30/2020 13:34    DG Chest Port 1 View  Result Date: 11/29/2020 CLINICAL DATA:  55 year female with shortness of breath. EXAM: PORTABLE CHEST 1 VIEW COMPARISON:  Chest radiograph dated 05/02/2018 FINDINGS: Dated there is cardiomegaly with mild vascular congestion. Left lung base linear atelectasis/scarring. No focal consolidation, pleural effusion or pneumothorax. No acute osseous pathology. IMPRESSION: Cardiomegaly with mild vascular congestion. No focal consolidation. Electronically Signed   By: Anner Crete M.D.   On: 11/29/2020 17:50   ECHOCARDIOGRAM COMPLETE  Result Date: 11/30/2020    ECHOCARDIOGRAM REPORT   Patient Name:   [name] Meng Date of Exam: 11/30/2020 Medical Rec #:  [id number]      Height:       67.0 in Accession #:    [id number]     Weight:       160.1 lb Date of Birth:  12-24-1965      BSA:          1.840 m Patient Age:    8 years       BP:           100/54 mmHg Patient Gender: F              HR:           86 bpm. Exam Location:  [facility] Procedure: 2D Echo Indications:    CHF-Acute Diastolic Q59.56  History:        Patient has prior history of Echocardiogram examinations, most                 recent 12/16/2018. CHF, Arrythmias:Atrial Fibrillation; Risk                 Factors:Diabetes, Dyslipidemia and Hypertension.                 Peripheral vascular disease, Acute Kidney Injury, Elevated BNP.  Sonographer:    Leavy Cella RDCS (AE) Referring Phys: [id number] OLADAPO ADEFESO IMPRESSIONS  1. Global hypokinesis with more prominent anteroseptal and apical hypokinesis. . Left ventricular ejection fraction, by estimation, is 35 to 40%. The left ventricle has moderately decreased function. The left ventricle demonstrates regional wall motion abnormalities (see scoring diagram/findings for description). Left ventricular diastolic parameters are indeterminate.  2. Right ventricular systolic function is normal. The right ventricular size is normal.  3. Left atrial size was severely dilated.  4. The  pericardial effusion is circumferential.  5. The mitral valve is normal in structure. No evidence of mitral valve regurgitation. No evidence of mitral stenosis.  6. The aortic valve has an indeterminant number of cusps. Aortic valve regurgitation is not visualized. No aortic stenosis is present.  7. The inferior vena cava is normal in size with greater than 50% respiratory variability, suggesting right atrial pressure of 3 mmHg. FINDINGS  Left Ventricle: Global hypokinesis with more prominent anteroseptal and apical hypokinesis. Left ventricular ejection fraction, by estimation, is 35 to 40%. The left ventricle has moderately decreased function. The left ventricle demonstrates regional wall motion abnormalities. Definity contrast agent was given IV to delineate the left ventricular endocardial borders. The left ventricular internal cavity size was normal in size. There is no left ventricular hypertrophy. Left ventricular diastolic parameters are indeterminate. Right Ventricle: The right ventricular size is normal. No increase in right ventricular wall thickness. Right ventricular systolic function is normal. Left Atrium: Left atrial size was severely dilated. Right Atrium: Right atrial size was normal in size. Pericardium: Trivial pericardial effusion is present. The pericardial effusion is circumferential. Mitral Valve: The mitral valve is normal in structure. There is mild thickening of the mitral valve leaflet(s). There is mild calcification of the mitral valve leaflet(s). Mild mitral annular calcification. No evidence of mitral valve regurgitation. No evidence of mitral valve stenosis. Tricuspid Valve: The tricuspid valve is normal in structure. Tricuspid valve regurgitation is not demonstrated. No evidence of tricuspid stenosis. Aortic Valve: The aortic valve has an indeterminant number of cusps. Aortic valve regurgitation is not visualized. No aortic stenosis is present. Aortic valve mean gradient measures 6.6  mmHg. Aortic valve peak gradient measures 13.8 mmHg. Aortic valve area, by VTI measures 1.28 cm. Pulmonic Valve: The  pulmonic valve was not well visualized. Pulmonic valve regurgitation is not visualized. No evidence of pulmonic stenosis. Aorta: The aortic root is normal in size and structure. Pulmonary Artery: Indeterminant PASP, inadequate TR jet. Venous: The inferior vena cava is normal in size with greater than 50% respiratory variability, suggesting right atrial pressure of 3 mmHg. IAS/Shunts: No atrial level shunt detected by color flow Doppler.  LEFT VENTRICLE PLAX 2D LVIDd:         5.55 cm  Diastology LVIDs:         4.22 cm  LV e' medial:    4.78 cm/s LV PW:         1.00 cm  LV E/e' medial:  29.7 LV IVS:        0.95 cm  LV e' lateral:   7.05 cm/s LVOT diam:     1.80 cm  LV E/e' lateral: 20.1 LV SV:         48 LV SV Index:   26 LVOT Area:     2.54 cm  RIGHT VENTRICLE RV S prime:     10.80 cm/s LEFT ATRIUM             Index       RIGHT ATRIUM           Index LA diam:        4.50 cm 2.45 cm/m  RA Area:     18.60 cm LA Vol (A2C):   64.8 ml 35.23 ml/m RA Volume:   54.40 ml  29.57 ml/m LA Vol (A4C):   85.6 ml 46.53 ml/m LA Biplane Vol: 77.2 ml 41.97 ml/m  AORTIC VALVE AV Area (Vmax):    1.17 cm AV Area (Vmean):   1.30 cm AV Area (VTI):     1.28 cm AV Vmax:           185.81 cm/s AV Vmean:          120.834 cm/s AV VTI:            0.370 m AV Peak Grad:      13.8 mmHg AV Mean Grad:      6.6 mmHg LVOT Vmax:         85.33 cm/s LVOT Vmean:        61.694 cm/s LVOT VTI:          0.187 m LVOT/AV VTI ratio: 0.50  AORTA Ao Root diam: 2.80 cm MITRAL VALVE MV Area (PHT): 3.16 cm     SHUNTS MV Decel Time: 240 msec     Systemic VTI:  0.19 m MV E velocity: 142.00 cm/s  Systemic Diam: 1.80 cm MV A velocity: 90.80 cm/s MV E/A ratio:  1.56 Carlyle Dolly MD Electronically signed by Carlyle Dolly MD Signature Date/Time: 11/30/2020/12:06:11 PM    Final      CBC Recent Labs  Lab 11/29/20 1730 11/30/20 0452  12/01/20 0605  WBC 3.6* 3.7* 7.2  HGB 9.5* 9.0* 9.6*  HCT 32.7* 31.2* 33.4*  PLT 237 221 228  MCV 89.8 90.2 90.5  MCH 26.1 26.0 26.0  MCHC 29.1* 28.8* 28.7*  RDW 18.3* 18.1* 17.9*  LYMPHSABS 0.8  --   --   MONOABS 0.3  --   --   EOSABS 0.0  --   --   BASOSABS 0.0  --   --     Chemistries  Recent Labs  Lab 11/29/20 1730 11/30/20 0452 12/01/20 0605  NA 137 138 137  K 3.5 3.5 3.2*  CL  108 106 104  CO2 18* 22 22  GLUCOSE 79 172* 124*  BUN 31* 33* 34*  CREATININE 2.10* 2.02* 2.14*  CALCIUM 7.8* 7.9* 7.9*  MG  --  1.4* 1.3*  AST 15 9*  --   ALT 8 9  --   ALKPHOS 84 80  --   BILITOT 0.4 0.6  --    ------------------------------------------------------------------------------------------------------------------ Recent Labs    11/29/20 1730  TRIG 119    Lab Results  Component Value Date   HGBA1C 5.7 (H) 11/29/2020   ------------------------------------------------------------------------------------------------------------------ No results for input(s): TSH, T4TOTAL, T3FREE, THYROIDAB in the last 72 hours.  Invalid input(s): FREET3 ------------------------------------------------------------------------------------------------------------------ Recent Labs    11/29/20 1730  FERRITIN 84    Coagulation profile Recent Labs  Lab 11/30/20 0452  INR 1.4*    Recent Labs    11/29/20 1730  DDIMER 4.75*    Cardiac Enzymes No results for input(s): CKMB, TROPONINI, MYOGLOBIN in the last 168 hours.  Invalid input(s): CK ------------------------------------------------------------------------------------------------------------------    Component Value Date/Time   BNP 2,905.0 (H) 12/01/2020 6812     Roxan Hockey M.D on 12/01/2020 at 7:55 PM  Go to [email or web address] - for contact info  [name] - Office  [phone]

## 2020-12-01 NOTE — Plan of Care (Signed)
  Problem: Acute Rehab OT Goals (only OT should resolve) Goal: Pt. Will Perform Upper Body Dressing Flowsheets (Taken 12/01/2020 0827) Pt Will Perform Upper Body Dressing:  with modified independence  sitting Goal: Pt. Will Perform Lower Body Dressing Flowsheets (Taken 12/01/2020 0827) Pt Will Perform Lower Body Dressing: with min assist Note: supine Goal: Pt/Caregiver Will Perform Home Exercise Program Flowsheets (Taken 12/01/2020 0827) Pt/caregiver will Perform Home Exercise Program:  Increased strength  Both right and left upper extremity  Independently  With written HEP provided Goal: OT Additional ADL Goal #1 Flowsheets (Taken 12/01/2020 0827) Additional ADL Goal #1: Pt will perform functional transfers to chair or wheelchair using slide board as needed with min guard assist.

## 2020-12-01 NOTE — TOC Progression Note (Signed)
Transition of Care [facility]) - Progression Note    Patient Details  Name: [name] MRN: [id number] Date of Birth: [birth year]  [facility] CM/SW Contact  Shade Flood, LCSW Phone Number: 12/01/2020, 12:06 PM  Clinical Narrative:     TOC following. Spoke with pt to provide update on bed offers. Pt selects [employer] [facility]. MD anticipating dc Monday. Kerri at [facility] states that they can accept pt on Monday. Insurance auth started with requested start date of Monday 12/04/20. Auth received 10 minutes later per Marianna Fuss at [facility].  Obtained pt's vaccination record from [facility] and faxed to [facility].  TOC will follow up Monday.  Expected Discharge Plan: [place] Barriers to Discharge: Continued Medical Work up  Expected Discharge Plan and Services Expected Discharge Plan: [place] In-house Referral: Clinical Social Work Discharge Planning Services: CM Consult Post Acute Care Choice: [place] arrangements for the past 2 months: Single Family Home                 DME Arranged: N/A DME Agency: NA       HH Arranged: NA HH Agency: NA         Social Determinants of Health (SDOH) Interventions    Readmission Risk Interventions Readmission Risk Prevention Plan 11/30/2020 10/11/2020  Transportation Screening Complete Complete  PCP or Specialist Appt within 5-7 Days - Complete  Home Care Screening - Complete  Medication Review (RN CM) - Complete  HRI or Home Care Consult Complete -  Social Work Consult for [place] Planning/Counseling Complete -  Palliative Care Screening Not Applicable -  Medication Review Press photographer) Complete -  Some recent data might be hidden

## 2020-12-01 NOTE — Progress Notes (Incomplete)
Patient seen and discussed with PA Ahmed Prima, I agree with her documentation. 55 yo female history of CAD, chronic systolic HF, PAF, severe PAD with multiple upper and lower extremity interventions with history of osteomyelitis and gangrene status post left AKA complicated by non-STEMI in 05/2018, DM2 admitted with SOB   Admission labs  Lactic acid 1.5 WBC 3.6 Hgb 9.5 Plt 237 K 3.5 Cr 2.1 BUN 31 Ddimer 4.75 BNP 2629 Trop 85 COVID neg CXR cardiomegaly, +congestion VQ nondiagnostic, cannot exclude PE Echo LVEF 35-40%, global hypokinesis with more prominent anteroseptal and apical hypokinesis EKG SR, nonspecific lateral ST/T changes   Presents with fluid overolad, recurrent drop in LVEF to 35-40%. From notes had an NSTEMI in 2019 in setting of severe systemic illness managed medically at the time, drop in LVEF at that time that later recovered. She never wanted to pursue cath following recovery from her systemic illness, since LVEF recovered and was doing well was not further pursued. Probable ischemic cardiomyopathy, evaluation is limited due to her multiple comorbidities including CKD IV, she also is consistent in not wanting a cath. No chest pains, very mild trop in setting of HF, do not suspect ACS She is on IV lasix 40mg  bid, negative 51mL yesterday though data incomplete. From review actually only got lasix once yesterday, AM dose held. Continue IV lasix  Soft bp's limited HF medical therapy at this time.   History of afib, issues with afib with RVR this admission, she has been on her beta blocker for soft bp's. Will start oral amio 400mg  bid x 7 days, then 200mg  bid x 2 weeks, then 200mg  daily. With renal function would avoid digoxin, soft bp's limit av nodal agents. HF, CAD, CKD limit other antiarrhythmic options. Continue her xarelto.    Carlyle Dolly MD

## 2020-12-01 NOTE — Consult Note (Addendum)
Cardiology Consult    Patient ID: KYLAH MARESH; [id number]; August 09, 1966   Admit date: 11/29/2020 Date of Consult: 12/01/2020  Primary Care Provider: Frazier Richards, MD Primary Cardiologist: Nelva Bush, MD   Patient Profile    [name] is a 55 y.o. female with past medical history of CAD (abnormal NST in 02/2017 and refused cath, s/p NSTEMI in 03/2018 while admitted for left AKA and in the setting of sepsis --> patient declined catheterization and medical management was pursued), presumed ischemic cardiomyopathy (EF 30-35% by echo in 04/2018, at 50-55% by repeat imaging in 11/2018), paroxysmal atrial fibrillation (on Xarelto), PAD (s/p L AKA in 04/2018, s/p R BKA in 09/2020), carotid artery stenosis, HTN, HLD, Type 2 DM and tobacco use who is being seen today for the evaluation of CHF at the request of Dr. Denton Brick.   History of Present Illness    [name] was last examined by Christell Faith, PA-C in 01/2019 and denied any anginal symptoms at that time. Was working with PT in regards to her left leg prosthesis. Given no recent anginal symptoms, PAD, Stage 3 CKD and due to her deferring catheterization, further ischemic testing was not pursued. Was continued on Lopressor and Lasix but initiation of an ACE-I/ARB was deferred to Nephrology. Was informed to follow-up in 6 months but has not been evaluated by Cardiology since.   She presented to [facility] ED on 11/29/2020 for evaluation of worsening dyspnea, cough and sinus congestion for the past 3 days. Oxygen saturations were in the 70's upon arrival to the ED and improved with placement of 2 L [place]. Labs showed WBC 3.6, Hgb 9.5, platelets 237, Na+ 137, K+ 3.5 and creatinine 2.10 (previously 2.3 - 2.8 in 09/2020). D-dimer 4.75. BNP 2629. COVID negative. EKG shows NSR, HR 69 with anterior infarct pattern and nonspecific ST abnormalities along the lateral leads. CXR showed cardiomegaly with mild vascular congestion. VQ Scan showed decreased  radiotracer uptake in the anterior segment of the right upper lobe as well as scattered areas of decreased radiotracer uptake elsewhere. Pulmonary embolism could not be excluded. Lower extremity dopplers showed no evidence of a DVT. Repeat echo shows a reduced EF of 35-40% with global HK and more prominent anteroseptal and apical  hypokinesis. RV function was normal and a trivial pericardial effusion. No significant valve abnormalities.   She was started on IV Lasix 66m BID on admission but by review of the MAR, several doses were held due to "soft BP" and this has been at 98/60 - 110/65 within the past 24 hours. She received 860mthe day of admission and only 4098mn 1/13. Did receive Metolazone 5mg50m 1/13 as well. Recorded output is minimum at 560 mL. Daily weights not recorded. Creatinine at 2.14 this AM and BNP further elevated to 2905.  In talking with the patient today, her main complaint is worsening back pain. She is concerned she is not receiving her Oxycodone and Lyrica as frequently as she takes at home. She did go into atrial fibrillation with RVR this AM around 0900 but is asymptomatic with this. Denies any chest pain or palpitations. Still reports dyspnea and orthopnea. She is wheelchair bound at baseline so unsure about exertional symptoms. Does not think she was taking Lopressor at home prior to admission.   Past Medical History:  Diagnosis Date  . Abnormal stress test    a. 02/2017 MV: large region of fixed perfusion defect in basal to mid inf, mid-dist inflat  walls, EF 43%. No ischemia (EF 55-60% by f/u echo).  . Arthritis   . Asthma   . Carotid arterial disease ([place])    a. 09/2017 Carotid U/S: 40-49% bilat ICA stenosis.  . Chronic back pain   . Coronary artery calcification seen on CT scan    a. 11/2017 CT Abd/Pelvis: Multi vessel coronary vascular Ca2+.  . Depression   . Diabetes mellitus   . Diabetic neuropathy ([place])   . Difficult intubation    DIFFICULT AIRWAY/FYI  .  Family history of adverse reaction to anesthesia    mother had difficlty waking   . Femoral-popliteal bypass graft occlusion, left ([place]) 12/02/2017  . GERD (gastroesophageal reflux disease)   . History of echocardiogram    a. 03/2017 Echo: EF 55-60%, mild LVH, nl RV fxn.  . Hyperlipidemia   . Ischemic cardiomyopathy    a. 04/2018 Echo: EF 30-35%, Gr2 DD, mild LVH. Mild MR. Mildly dil LA, mod dil RV w/ mod red RV fxn, mild TR, PASP 26mHg.  . NSTEMI (non-ST elevated myocardial infarction) ([place]    a. 05/2018 in setting of Afib, sepsis, and post-op L AKA. Peak trop 7.7. EF 30-35% by echo-->cath not performed 2/2 renal failure.  . 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 PAD (peripheral artery disease) ([place]    a. S/p L fem-pop bypass; b. 11/2017 s/p Aortobifem bypass 2/2 graft occlusion; c. 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  . Paroxysmal atrial fibrillation with rapid ventricular response ([place] 12/02/2017   a. CHA2DS2VASc = 3-->Xarelto; b. 05/2018 Recurrent Afib-->amio.  .Marland KitchenRenal insufficiency   . Ulcer    Foot    Past Surgical History:  Procedure Laterality Date  . ABDOMINAL AORTAGRAM  June 15, 2014  . ABDOMINAL AORTAGRAM N/A 06/15/2014   Procedure: ABDOMINAL AMaxcine Ham  Surgeon: VSerafina Mitchell MD;  [facility];  Service: Cardiovascular;  Laterality: N/A;  . ABDOMINAL AORTAGRAM N/A 11/22/2014   Procedure: ABDOMINAL AORTAGRAM;  Surgeon: VSerafina Mitchell MD;  [facility];  Service: Cardiovascular;  Laterality: N/A;  . ABDOMINAL AORTOGRAM W/LOWER EXTREMITY N/A 01/07/2017   Procedure: Abdominal Aortogram w/Lower Extremity;  Surgeon: VSerafina Mitchell MD;  [place];  Service: Cardiovascular;  Laterality: N/A;  . ABDOMINAL AORTOGRAM W/LOWER EXTREMITY N/A 10/31/2017   Procedure: ABDOMINAL AORTOGRAM W/LOWER EXTREMITY;  Surgeon: FElam Dutch MD;  [place];  Service: Cardiovascular;  Laterality:  N/A;  . ABDOMINAL AORTOGRAM W/LOWER EXTREMITY N/A 03/24/2018   Procedure: ABDOMINAL AORTOGRAM W/LOWER EXTREMITY;  Surgeon: BSerafina Mitchell MD;  [place];  Service: Cardiovascular;  Laterality: N/A;  . AMPUTATION Left 04/26/2018   Procedure: AMPUTATION ABOVE KNEE;  Surgeon: FElam Dutch MD;  [place]  Service: Vascular;  Laterality: Left;  . AORTA - BILATERAL FEMORAL ARTERY BYPASS GRAFT N/A 11/28/2017   Procedure: AORTA BIFEMORAL BYPASS USING HEMASHIELD GOLD GRAFT & REIMPLANT IMA;  Surgeon: BSerafina Mitchell MD;  [facility];  Service: Vascular;  Laterality: N/A;  . AORTIC ARCH ANGIOGRAPHY N/A 10/31/2017   Procedure: AORTIC ARCH ANGIOGRAPHY;  Surgeon: FElam Dutch MD;  [place];  Service: Cardiovascular;  Laterality: N/A;  . APPLICATION OF WOUND VAC  11/28/2017   Procedure: APPLICATION OF WOUND VAC;  Surgeon: BSerafina Mitchell MD;  [place]  Service: Vascular;;  . APPLICATION OF WOUND VAC Left 03/27/2018   Procedure: APPLICATION OF WOUND  VAC LEFT GROIN USING PREVENA PLUS;  Surgeon: Serafina Mitchell, MD;  Location: [place];  Service: Vascular;  Laterality: Left;  . ARTERIAL BYPASS SURGERY   07/05/2010   Right Common Femoral to below knee popliteal BPG  . BACK SURGERY     X's  2  . CARDIAC CATHETERIZATION    . CHOLECYSTECTOMY     Gall Bladder  . CYSTECTOMY Left    wrist  . EMBOLECTOMY Left 11/28/2017   Procedure: Left Lower Extremity Embolectomy, Left Tibial Peroneal Trunk Endarterectomy with Patch Angioplasty; Vein Harvest Small Saphenous Graft Left Lower Leg;  Surgeon: [name], MD;  Location: [place];  Service: Vascular;  Laterality: Left;  . EYE SURGERY Left 02/23/2020   Dr. Zadie Rhine  . FEMORAL-POPLITEAL BYPASS GRAFT Left 03/27/2018   Procedure: THROMBECTOMY OF LEFT FEMORAL TIBIAL BYPASS;  Surgeon: Serafina Mitchell, MD;  Location: [place];  Service: Vascular;  Laterality: Left;  . FEMORAL-TIBIAL BYPASS GRAFT Left  03/27/2018   Procedure: BYPASS GRAFT FEMORAL-TIBIAL ARTERY LEFT REDO USING CRYOPRESERVED SAPHENOUS VEIN 70cm;  Surgeon: Serafina Mitchell, MD;  Location[facility];  Service: Vascular;  Laterality: Left;  . INTERCOSTAL NERVE BLOCK  November 2015  . INTRAOPERATIVE ARTERIOGRAM  11/28/2017   Procedure: INTRA OPERATIVE ARTERIOGRAM OF LEFT LEG;  Surgeon: Serafina Mitchell, MD;  Location: [place];  Service: Vascular;;  . INTRAOPERATIVE ARTERIOGRAM Left 03/27/2018   Procedure: INTRA OPERATIVE ARTERIOGRAM TIMES TWO;  Surgeon: Serafina Mitchell, MD;  Location: [place];  Service: Vascular;  Laterality: Left;  . IR FLUORO GUIDE CV LINE RIGHT  03/20/2017  . IR FLUORO GUIDE CV LINE RIGHT  05/05/2018  . IR REMOVAL TUN CV CATH W/O FL  05/20/2018  . IR US GUIDE VASC ACCESS RIGHT  03/20/2017  . IR US GUIDE VASC ACCESS RIGHT  05/05/2018  . IRRIGATION AND DEBRIDEMENT BUTTOCKS Right 09/30/2016   Procedure: DEBRIDEMENT RIGHT  BUTTOCK WOUND;  Surgeon: Georganna Skeans, MD;  Location: [place];  Service: General;  Laterality: Right;  . left foot surgery    . PERIPHERAL VASCULAR CATHETERIZATION N/A 05/07/2016   Procedure: Abdominal Aortogram;  Surgeon: Serafina Mitchell, MD;  Location: [place] CV LAB;  Service: Cardiovascular;  Laterality: N/A;  . PERIPHERAL VASCULAR CATHETERIZATION N/A 05/07/2016   Procedure: Lower Extremity Angiography;  Surgeon: Serafina Mitchell, MD;  Location: [place] CV LAB;  Service: Cardiovascular;  Laterality: N/A;  . PERIPHERAL VASCULAR CATHETERIZATION N/A 05/07/2016   Procedure: Aortic Arch Angiography;  Surgeon: Serafina Mitchell, MD;  Location: [place] CV LAB;  Service: Cardiovascular;  Laterality: N/A;  . PERIPHERAL VASCULAR CATHETERIZATION N/A 05/07/2016   Procedure: Upper Extremity Angiography;  Surgeon: Serafina Mitchell, MD;  Location: [place] CV LAB;  Service: Cardiovascular;  Laterality: N/A;  . PERIPHERAL VASCULAR CATHETERIZATION Right 05/07/2016   Procedure: Peripheral Vascular Balloon Angioplasty;   Surgeon: Serafina Mitchell, MD;  Location: [place] CV LAB;  Service: Cardiovascular;  Laterality: Right;  subclavian  . PERIPHERAL VASCULAR CATHETERIZATION Right 05/07/2016   Procedure: Peripheral Vascular Intervention;  Surgeon: Serafina Mitchell, MD;  Location: [place] CV LAB;  Service: Cardiovascular;  Laterality: Right;  External  Iliac  . SKIN GRAFT Right 2012   [name] by Dr. Nils Pyle- Right and Left Ankle  . SPINE SURGERY    . THROMBECTOMY FEMORAL ARTERY  11/28/2017   Procedure: THROMBECTOMY  & REVISION OF BILATERAL FEMORAL TO POPLETEAL ARTERIES;  Surgeon: Serafina Mitchell, MD;  Location: [place];  Service:  Vascular;;  . TONSILLECTOMY    . TRANSMETATARSAL AMPUTATION Right 10/07/2020   Procedure: RIGHT BELOW KNEE AMPUTATION;  Surgeon: Erle Crocker, MD;  Location: [place];  Service: Orthopedics;  Laterality: Right;     Home Medications:  Prior to Admission medications   Medication Sig Start Date End Date Taking? Authorizing Provider  albuterol (PROVENTIL) 2 MG tablet Take 2 mg by mouth daily as needed for shortness of breath.    Yes [provider]  dicyclomine (BENTYL) 10 MG capsule Take 10 mg by mouth 4 (four) times daily as needed for spasms.   Yes [provider]  furosemide (LASIX) 40 MG tablet Take 40 mg by mouth daily. 11/23/20  Yes [provider]  HYDROcodone-acetaminophen (NORCO) 10-325 MG tablet Take 1 tablet by mouth every 2 (two) hours as needed (pain).  07/14/18  Yes [provider]  insulin glargine (LANTUS) 100 UNIT/ML injection Inject 0.18 mLs (18 Units total) into the skin 2 (two) times daily. Patient taking differently: Inject 30 Units into the skin 2 (two) times daily. 06/03/18  Yes Love, Ivan Anchors, PA-C  insulin lispro (HUMALOG) 100 UNIT/ML injection For glucose 121-150 use TWO units,  for 151-200 use FOUR units, for 201-250 use SIX units, for 251-300 use EIGHT units, for 301-350 use TEN units. For blood sugars greater than 350 call  MD. Patient taking differently: Inject 10 Units into the skin 3 (three) times daily with meals. For glucose 121-150 use TWO units,  for 151-200 use FOUR units, for 201-250 use SIX units, for 251-300 use EIGHT units, for 301-350 use TEN units. For blood sugars greater than 350 call MD. 06/03/18  Yes Love, Ivan Anchors, PA-C  LYRICA 50 MG capsule Take 1 capsule by mouth twice daily Patient taking differently: Take 50 mg by mouth See admin instructions. Take 3 tablets every morning, and then midday 4 tablets and then every evening 3 tablets 10/23/20  Yes Fayrene Helper, MD  omeprazole (PRILOSEC) 40 MG capsule Take 40 mg by mouth daily. 05/15/20  Yes [provider]  sodium bicarbonate 650 MG tablet Take 1 tablet (650 mg total) by mouth 2 (two) times daily. 10/11/20  Yes Pahwani, Rinka R, MD  vitamin B-12 1000 MCG tablet Take 1 tablet (1,000 mcg total) by mouth daily. 10/12/20  Yes Pahwani, Rinka R, MD  amitriptyline (ELAVIL) 25 MG tablet Take 1 tablet (25 mg total) by mouth at bedtime. Patient not taking: Reported on 11/29/2020 10/11/20   Mckinley Jewel, MD  furosemide (LASIX) 20 MG tablet Take 2 tablets (40 mg total) by mouth 2 (two) times a week. Patient not taking: Reported on 11/29/2020 10/12/20 10/12/21  Mckinley Jewel, MD  metoprolol tartrate (LOPRESSOR) 25 MG tablet Take 0.5 tablets (12.5 mg total) by mouth 2 (two) times daily. Patient not taking: No sig reported 07/21/18   Theora Gianotti, NP  naloxone [facility]) 4 MG/0.1ML LIQD nasal spray kit Place 1 spray into the nose once as needed (opioid overdose).    [provider]  oxyCODONE (OXY IR/ROXICODONE) 5 MG immediate release tablet Take 1-2 tablets (5-10 mg total) by mouth every 6 (six) hours as needed for moderate pain, severe pain or breakthrough pain. Patient not taking: Reported on 11/29/2020 10/11/20   Mckinley Jewel, MD  Rivaroxaban (XARELTO) 15 MG TABS tablet Take 1 tablet (15 mg total) by mouth daily with  supper. Patient not taking: Reported on 11/29/2020 10/11/20   Mckinley Jewel, MD  rosuvastatin (Lamar) 20  MG tablet Take 1 tablet (20 mg total) by mouth daily at 6 PM. Patient not taking: No sig reported 07/23/19   Theora Gianotti, NP    Inpatient Medications: Scheduled Meds: . furosemide  40 mg Intravenous Q12H  . insulin aspart  0-5 Units Subcutaneous QHS  . insulin aspart  0-9 Units Subcutaneous TID WC  . pantoprazole  40 mg Oral Daily  . potassium chloride  40 mEq Oral Q3H  . pregabalin  50 mg Oral BID  . Rivaroxaban  15 mg Oral Q supper  . rosuvastatin  20 mg Oral Daily  . sodium bicarbonate  650 mg Oral BID   Continuous Infusions: . cefTRIAXone (ROCEPHIN)  IV 1 g (11/30/20 1748)  . magnesium sulfate bolus IVPB 4 g (12/01/20 1010)   PRN Meds: oxyCODONE  Allergies:    Allergies  Allergen Reactions  . Lactose Intolerance (Gi) Diarrhea    Social History:   Social History   Socioeconomic History  . Marital status: Married    Spouse name: Not on file  . Number of children: Not on file  . Years of education: Not on file  . Highest education level: Not on file  Occupational History  . Occupation: disabled  Tobacco Use  . Smoking status: Current Some Day Smoker    Packs/day: 1.00    Years: 30.00    Pack years: 30.00    Types: Cigarettes    Last attempt to quit: 02/07/2017    Years since quitting: 3.8  . Smokeless tobacco: Never Used  Vaping Use  . Vaping Use: Never used  Substance and Sexual Activity  . Alcohol use: No    Alcohol/week: 0.0 standard drinks  . Drug use: No  . Sexual activity: Yes    Birth control/protection: Post-menopausal  Other Topics Concern  . Not on file  Social History Narrative  . Not on file   Social Determinants of Health   Financial Resource Strain: Not on file  Food Insecurity: Not on file  Transportation Needs: Not on file  Physical Activity: Not on file  Stress: Not on file  Social Connections: Not on file   Intimate Partner Violence: Not on file     Family History:    Family History  Problem Relation Age of Onset  . Coronary artery disease Mother   . Peripheral vascular disease Mother   . Heart disease Mother        Before age 4  . Other Mother        Venous insuffiency  . Diabetes Mother   . Hyperlipidemia Mother   . Hypertension Mother   . Varicose Veins Mother   . Heart attack Mother        before age 33  . Heart disease Father   . Diabetes Father   . Diabetes Maternal Grandmother   . Diabetes Paternal Grandmother   . Diabetes Paternal Grandfather   . Diabetes Sister   . Hypertension Sister   . Diabetes Brother   . Hypertension Brother       Review of Systems    General:  No chills, fever, night sweats or weight changes.  Cardiovascular:  No chest pain, edema, orthopnea, palpitations, paroxysmal nocturnal dyspnea. Positive for dyspnea.  Dermatological: No rash, lesions/masses Respiratory: Positive for cough and dyspnea Urologic: No hematuria, dysuria Abdominal:   No nausea, vomiting, diarrhea, bright red blood per rectum, melena, or hematemesis Neurologic:  No visual changes, wkns, changes in mental status. All other systems reviewed and  are otherwise negative except as noted above.  Physical Exam/Data    Vitals:   11/30/20 1957 11/30/20 2047 12/01/20 0206 12/01/20 0504  BP: 96/80  (!) 101/51 110/65  Pulse: 84  91 85  Resp: _0 Temp: 98.2 F (36.8 C)  98 F (36.7 C) 98 F (36.7 C)  TempSrc:      SpO2: 100% 99% 93% 99%  Weight:      Height:        Intake/Output Summary (Last 24 hours) at 12/01/2020 1020 Last data filed at 12/01/2020 0500 Gross per 24 hour  Intake 240 ml  Output 800 ml  Net -560 ml   Filed Weights   11/29/20 1726  Weight: 72.6 kg   Body mass index is 25.07 kg/m.   General: Pleasant, female appearing in NAD Psych: Normal affect. Neuro: Alert and oriented X 3. Moves all extremities spontaneously. HEENT: Normal  Neck:  Supple without bruits. JVD at 10 cm. Lungs:  Resp regular and unlabored, rales along bases bilaterally. Heart: Tachycardiac, irregularly irregular.  No s3, s4, or murmurs. Abdomen: Soft, non-tender, non-distended, BS + x 4.  Extremities: No clubbing, cyanosis. Right BKA and left AKA.  EKG:  The EKG was personally reviewed and demonstrates: NSR, HR 69 with anterior infarct pattern and nonspecific ST abnormalities along the lateral leads.   Telemetry:  Telemetry was personally reviewed and demonstrates:  NSR, HR in 80's to 90's. Went into atrial fibrillation with RVR around 0900 and HR currently in the 130's to 140's.    Labs/Studies     Relevant CV Studies:  Echocardiogram: 11/2018 IMPRESSIONS    1. The left ventricle has low normal systolic function of 01-60%. The  cavity size is normal. There is normal left ventricular hypertrophy. Echo  evidence of impaired relaxation diastolic filling patterns. Unable to  exclude regional wall motion  abnormality.  2. Right ventricular systolic pressure is normal.  3. Mildly dilated left atrial size.  4. Normal right atrial size.  5. Mitral valve regurgitation is trivial by color flow Doppler.  6. The mitral valve normal in structure.  7. Normal tricuspid valve.  8. Aortic valve normal.  9. No atrial level shunt detected by color flow Doppler.   Echocardiogram: 11/30/2020 IMPRESSIONS    1. Global hypokinesis with more prominent anteroseptal and apical  hypokinesis. . Left ventricular ejection fraction, by estimation, is 35 to  40%. The left ventricle has moderately decreased function. The left  ventricle demonstrates regional wall motion  abnormalities (see scoring diagram/findings for description). Left  ventricular diastolic parameters are indeterminate.  2. Right ventricular systolic function is normal. The right ventricular  size is normal.  3. Left atrial size was severely dilated.  4. The pericardial effusion is  circumferential.  5. The mitral valve is normal in structure. No evidence of mitral valve  regurgitation. No evidence of mitral stenosis.  6. The aortic valve has an indeterminant number of cusps. Aortic valve  regurgitation is not visualized. No aortic stenosis is present.  7. The inferior vena cava is normal in size with greater than 50%  respiratory variability, suggesting right atrial pressure of 3 mmHg.  Laboratory Data:  Chemistry Recent Labs  Lab 11/29/20 1730 11/30/20 0452 12/01/20 0605  NA 137 138 137  K 3.5 3.5 3.2*  CL 108 106 104  CO2 18* 22 22  GLUCOSE 79 172* 124*  BUN 31* 33* 34*  CREATININE 2.10* 2.02* 2.14*  CALCIUM 7.8* 7.9* 7.9*  GFRNONAA  27* 28* 27*  ANIONGAP _0 Recent Labs  Lab 11/29/20 1730 11/30/20 0452 12/01/20 0605  PROT 6.2* 5.8*  --   ALBUMIN 2.7* 2.4* 2.8*  AST 15 9*  --   ALT 8 9  --   ALKPHOS 84 80  --   BILITOT 0.4 0.6  --    Hematology Recent Labs  Lab 11/29/20 1730 11/30/20 0452 12/01/20 0605  WBC 3.6* 3.7* 7.2  RBC 3.64* 3.46* 3.69*  HGB 9.5* 9.0* 9.6*  HCT 32.7* 31.2* 33.4*  MCV 89.8 90.2 90.5  MCH 26.1 26.0 26.0  MCHC 29.1* 28.8* 28.7*  RDW 18.3* 18.1* 17.9*  PLT 237 221 228   Cardiac EnzymesNo results for input(s): TROPONINI in the last 168 hours. No results for input(s): TROPIPOC in the last 168 hours.  BNP Recent Labs  Lab 11/29/20 1730 12/01/20 0605  BNP 2,629.0* 2,905.0*    DDimer  Recent Labs  Lab 11/29/20 1730  DDIMER 4.75*    Radiology/Studies:  NM Pulmonary Perfusion  Result Date: 11/30/2020 CLINICAL DATA:  Shortness of breath EXAM: NUCLEAR MEDICINE PERFUSION LUNG SCAN TECHNIQUE: Perfusion images were obtained in multiple projections after intravenous injection of radiopharmaceutical. Views: Anterior, posterior, left lateral, right lateral, RPO, LPO, RAO, LAO RADIOPHARMACEUTICALS:  4.4 mCi Tc-67mMAA IV COMPARISON:  Chest radiograph November 29, 2020 FINDINGS: There is a segmental area of  decreased uptake in the anterior segment of the right upper lobe. Scattered subsegmental defects noted elsewhere. There is cardiomegaly. IMPRESSION: There is decreased radiotracer uptake in the anterior segment of the right upper lobe as well as scattered areas of decreased radiotracer uptake elsewhere. Pulmonary embolism cannot be excluded in this circumstance. This circumstance is felt to warrant consideration for CT angiogram chest to further evaluate. If there is a contraindication to contrast administration, it may be reasonable to consider lower extremity venous duplex ultrasound to assess for possible lower extremity deep venous thrombosis. Cardiomegaly noted. Electronically Signed   By: WLowella GripIII M.D.   On: 11/30/2020 07:51   UKoreaVenous Img Lower Bilateral (DVT)  Result Date: 11/30/2020 CLINICAL DATA:  Bilateral leg pain and swelling. Right below-knee amputation. Left above knee amputation. EXAM: BILATERAL LOWER EXTREMITY VENOUS DOPPLER ULTRASOUND TECHNIQUE: Gray-scale sonography with graded compression, as well as color Doppler and duplex ultrasound were performed to evaluate the lower extremity deep venous systems from the level of the common femoral vein and including the common femoral, femoral, profunda femoral, popliteal and calf veins including the posterior tibial, peroneal and gastrocnemius veins when visible. The superficial great saphenous vein was also interrogated. Spectral Doppler was utilized to evaluate flow at rest and with distal augmentation maneuvers in the common femoral, femoral and popliteal veins. COMPARISON:  None. FINDINGS: RIGHT LOWER EXTREMITY Common Femoral Vein: No evidence of thrombus. Normal compressibility, respiratory phasicity and response to augmentation. Saphenofemoral Junction: No evidence of thrombus. Normal compressibility and flow on color Doppler imaging. Profunda Femoral Vein: No evidence of thrombus. Normal compressibility and flow on color Doppler  imaging. Femoral Vein: No evidence of thrombus. Normal compressibility, respiratory phasicity and response to augmentation. Popliteal Vein: No evidence of thrombus. Normal compressibility, respiratory phasicity and response to augmentation. Other Findings:  Limited exam due to tissue edema. LEFT LOWER EXTREMITY Common Femoral Vein: No evidence of thrombus. Normal compressibility, respiratory phasicity and response to augmentation. Saphenofemoral Junction: No evidence of thrombus. Normal compressibility and flow on color Doppler imaging. Profunda Femoral Vein: No evidence of thrombus. Normal compressibility and  flow on color Doppler imaging. Femoral Vein: No evidence of thrombus. Normal compressibility, respiratory phasicity and response to augmentation. Limited exam due to soft tissue edema. Portable exam. IMPRESSION: No evidence of deep venous thrombosis in either lower extremity. Examination limited by tissue edema. Electronically Signed   By: Franchot Gallo M.D.   On: 11/30/2020 13:34   DG Chest Port 1 View  Result Date: 11/29/2020 CLINICAL DATA:  55 year female with shortness of breath. EXAM: PORTABLE CHEST 1 VIEW COMPARISON:  Chest radiograph dated 05/02/2018 FINDINGS: Dated there is cardiomegaly with mild vascular congestion. Left lung base linear atelectasis/scarring. No focal consolidation, pleural effusion or pneumothorax. No acute osseous pathology. IMPRESSION: Cardiomegaly with mild vascular congestion. No focal consolidation. Electronically Signed   By: Anner Crete M.D.   On: 11/29/2020 17:50      Assessment & Plan    1. Acute on Chronic Combined Systolic and Diastolic CHF - His EF was previously at 30-35% by echo in 04/2018, at 50-55% by repeat imaging in 11/2018. Presented with worsening dyspnea and abdominal distension, found to have an acute CHF exacerbation with BNP elevated to 2629 and CXR showing vascular congestion. VQ Scan indeterminate but lower extremity dopplers showed  no evidence of a DVT. Repeat echo shows a reduced EF of 35-40% with global HK and more prominent anteroseptal and apical  hypokinesis.  - She is currently on IV Lasix 40 mg BID but as outlined above she has not been receiving this as BID dosing due to softer BP. She remains volume overloaded by examination, therefore would recommend increasing Lasix to 28m BID tomorrow if output is minimal today.  - She declines a catheterization, therefore would treat her cardiomyopathy medically for now. Medication options are limited due to both her hypotension and underlying CKD. Would plan to restart Lopressor and transition to Toprol-XL prior to d/c if BP allows. No ACE-I/ARB/ARNI/Dig due to renal function and BP does not allow for the use of Hydralazine/Nitrates at this time.   2. Presumed CAD/Elevated Troponin  - She had a previously abnormal NST in 02/2017 and refused cath. Also had an NSTEMI in 03/2018 while admitted for left AKA and in the setting of sepsis and she declined catheterization at that time.  - She has dyspnea but denies any recent chest pain. Not active at baseline as she is mostly wheelchair-bound. HS Troponin 85 this admission with repeat values pending. Suspect secondary to demand ischemia in the setting of her CHF exacerbation. Reviewed a possible catheterization with the patient today and she declines as she says she would never want to undergo any procedures that involve contrast dye and take any risks with her CKD. Therefore, pursing a stress test would not change her management strategy given she refuses a catheterization.  - Continue Crestor 2443mdaily. Would plan to restart BB therapy. Not on ASA given the need for anticoagulation.   3. Atrial Fibrillation with RVR - She has a history of paroxysmal atrial fibrillation and was initially in NSR this admission but went into atrial fibrillation with RVR this AM. Will give IV Lopressor 2.43m20m1 given her soft BP and would anticipate  restarting BB therapy. Was previously on PO Lopressor but not taking regularly prior to admission. Would benefit from having this switched to Toprol-XL prior to d/c in the setting of her cardiomyopathy. If atrial fibrillation with RVR persists, would need to use Amiodarone due to her BP.  - This patients CHA2DS2-VASc Score and unadjusted Ischemic Stroke Rate (% per  year) is equal to 7.2 % stroke rate/year from a score of 5 (CHF, HTN, DM, Vascular, Female). Remains on Xarelto 43m daily for anticoagulation.   4. 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 Followed by Vascular Surgery. Remains on Crestor and Xarelto.   5. Stage 4 CKD - Creatinine was previously 2.3 - 2.8 in 09/2020, stable at 2.14 this AM.    For questions or updates, please contact CAntaresPlease consult [email or web address] for contact info under Cardiology/STEMI.  Signed, BErma Heritage PA-C 12/01/2020, 10:20 AM Pager: [phone] Patient seen and discussed with PA SAhmed Prima I agree with her documentation. 55yo female history of CAD, chronic systolic HF, PAF, severe PAD with multiple upper and lower extremity interventions with history of osteomyelitis and gangrene status post left AKA complicated by non-STEMI in 05/2018, DM2 admitted with SOB   Admission labs  Lactic acid 1.5 WBC 3.6 Hgb 9.5 Plt 237 K 3.5 Cr 2.1 BUN 31 Ddimer 4.75 BNP 2629 Trop 85 COVID neg CXR cardiomegaly, +congestion VQ nondiagnostic, cannot exclude PE Echo LVEF 35-40%, global hypokinesis with more prominent anteroseptal and apical hypokinesis EKG SR, nonspecific lateral ST/T changes   Presents with fluid overolad, recurrent drop in LVEF to 35-40%. From notes had an NSTEMI in 2019 in setting of severe systemic illness managed medically at the time, drop in LVEF at that time that later recovered. She never wanted to pursue cath following recovery from her systemic illness, since LVEF recovered and was doing well was not  further pursued. Probable ischemic cardiomyopathy, evaluation is limited due to her multiple comorbidities including CKD IV, she also is consistent in not wanting a cath. No chest pains, very mild trop in setting of HF, do not suspect ACS She is on IV lasix 459mbid, negative 56026mesterday though data incomplete. From review actually only got lasix once yesterday, AM dose held. Continue IV lasix  Soft bp's limited HF medical therapy at this time.   History of afib, issues with afib with RVR this admission, she has been on her beta blocker for soft bp's. Will start oral amio 400m32md x 7 days, then 200mg59m x 2 weeks, then 200mg 26my. With renal function would avoid digoxin, soft bp's limit av nodal agents. HF, CAD, CKD limit other antiarrhythmic options. Continue her xarelto.    JonathCarlyle Dolly

## 2020-12-01 NOTE — Evaluation (Signed)
Occupational Therapy Evaluation Patient Details Name: [name] MRN: [id number] DOB: 1966/02/02 Today's Date: 12/01/2020    History of Present Illness [name] is a 55 y.o. female with medical history significant for  type 2 diabetes, diabetic polyneuropathy, GERD, hyperlipidemia, paroxysmal A. fib, ischemic cardiomyopathy, peripheral vascular disease post left above-the-knee amputation and Right BKA who presents to the emergency department via EMS accompanied by her husband due to 1 week of cough (occasionally productive with clear sputum), chest congestion, nasal congestion and 3-day onset of shortness of breath.  Patient takes Lasix at baseline and she states that the dosage was being changed (recently reduced to 20 Mg from 80 mg due to worsening kidney function), otherwise she states that she has been compliant with the Lasix.  She also complains of increased abdominal girth which she states was possibly due to fluid accommodation.  Home PT checked O2 sat today and was in the high 70s, this was re- checked by husband and was noted to be in the 59s, EMS was activated, and patient was sent to the ED for further evaluation. She is wheelchair-bound due to bilateral lower extremity amputation.   Clinical Impression   Pt sitting up at EOB, agreeable to OT evaluation this am. Pt reports husband assists with ADLs sometimes, sometimes she completes independently. Pt performing transfer to chair using drop-arm recliner with min assist for clearing [name]. Pt with generalized weakness, is able to complete simple ADLs while in sitting, requiring additional assistance for LB tasks, reaching behind back, etc. Recommend SNF on discharge to improve safety and independence during ADLs and functional transfer tasks.     Follow Up Recommendations  SNF    Equipment Recommendations  None recommended by OT       Precautions / Restrictions Precautions Precautions: Fall Restrictions Weight Bearing  Restrictions: No      Mobility Bed Mobility Overal bed mobility: Needs Assistance Bed Mobility: Supine to Sit;Sit to Supine     Supine to sit: Mod assist;+2 for physical assistance Sit to supine: Supervision        Transfers Overall transfer level: Needs assistance   Transfers: Lateral/Scoot Transfers          Lateral/Scoot Transfers: Min assist General transfer comment: pt scooting from bed to chair using drop-arm recliner. Increased time required, cuing for hand placement        ADL either performed or assessed with clinical judgement   ADL Overall ADL's : Needs assistance/impaired Eating/Feeding: Set up;Sitting   Grooming: Set up;Sitting                   Toilet Transfer:  (pt uses bed pan)             General ADL Comments: Pt requires assist at baseline due to limited mobility-wheelchair bound, does not ambulate. Pt sedentary at baseline, husband assists as needed. Is able to perform simple ADLs with set-up, more assist needed for LB tasks     Vision Baseline Vision/History: Legally blind Patient Visual Report: No change from baseline              Pertinent Vitals/Pain Pain Assessment: 0-10 Pain Score: 7  Pain Location: chronic low back Pain Descriptors / Indicators: Aching;Sore;Grimacing Pain Intervention(s): Limited activity within patient's tolerance;Monitored during session;Repositioned     Hand Dominance Right   Extremity/Trunk Assessment Upper Extremity Assessment Upper Extremity Assessment: Generalized weakness   Lower Extremity Assessment Lower Extremity Assessment: Defer to PT evaluation   Cervical / Trunk  Assessment Cervical / Trunk Assessment: Normal   Communication Communication Communication: No difficulties   Cognition Arousal/Alertness: Awake/alert Behavior During Therapy: WFL for tasks assessed/performed Overall Cognitive Status: Within Functional Limits for tasks assessed                                                 Home Living Family/patient expects to be discharged to:: Private residence Living Arrangements: Spouse/significant other Available Help at Discharge: Family;Available PRN/intermittently Type of Home: House Home Access: Ramped entrance     Home Layout: One level     Bathroom Shower/Tub: Occupational psychologist: Standard     Home Equipment: Bedside commode;Wheelchair - manual;Electric scooter;Hospital bed;Shower seat   Additional Comments: has L prosthethic-never used, uses bed pan, sponge bathes      Prior Functioning/Environment Level of Independence: Needs assistance  Gait / Transfers Assistance Needed: Mod Independent transfers using sliding board or scooting into w/c, non-ambulatory ADL's / Homemaking Assistance Needed: assisted by family-husband assists with dressing, bathing, meal prep            OT Problem List: Decreased strength;Decreased activity tolerance;Impaired balance (sitting and/or standing);Decreased knowledge of use of DME or AE      OT Treatment/Interventions: Self-care/ADL training;Therapeutic exercise;DME and/or AE instruction;Therapeutic activities;Patient/family education    OT Goals(Current goals can be found in the care plan section) Acute Rehab OT Goals Patient Stated Goal: go to rehab OT Goal Formulation: With patient Time For Goal Achievement: 12/15/20 Potential to Achieve Goals: Good  OT Frequency: Min 1X/week                 End of Session Equipment Utilized During Treatment: Oxygen Nurse Communication: Mobility status (RN observing portion of transfer to chair)  Activity Tolerance: Patient tolerated treatment well Patient left: in chair;with call bell/phone within reach;with chair alarm set  OT Visit Diagnosis: Muscle weakness (generalized) (M62.81)                Time: 0093-8182 OT Time Calculation (min): 42 min Charges:  OT General Charges $OT Visit: 1 Visit OT Evaluation $OT Eval  Low Complexity: Elsa, OTR/L  [phone] 12/01/2020, 8:23 AM

## 2020-12-02 ENCOUNTER — Inpatient Hospital Stay
Admission: RE | Admit: 2020-12-02 | Discharge: 2020-12-05 | Disposition: A | Discharge status: Home / Self Care | Payer: Medicare Other | Source: Ambulatory Visit | Attending: Internal Medicine | Admitting: Internal Medicine

## 2020-12-02 ENCOUNTER — Other Ambulatory Visit: Payer: Self-pay | Admitting: Orthopedic Surgery

## 2020-12-02 DIAGNOSIS — E1152 Type 2 diabetes mellitus with diabetic peripheral angiopathy with gangrene: Secondary | ICD-10-CM

## 2020-12-02 DIAGNOSIS — Z794 Long term (current) use of insulin: Secondary | ICD-10-CM

## 2020-12-02 LAB — CBC
HCT: 29.8 % — ABNORMAL LOW (ref 36.0–46.0)
Hemoglobin: 8.6 g/dL — ABNORMAL LOW (ref 12.0–15.0)
MCH: 26 pg (ref 26.0–34.0)
MCHC: 28.9 g/dL — ABNORMAL LOW (ref 30.0–36.0)
MCV: 90 fL (ref 80.0–100.0)
Platelets: 194 10*3/uL (ref 150–400)
RBC: 3.31 MIL/uL — ABNORMAL LOW (ref 3.87–5.11)
RDW: 17.9 % — ABNORMAL HIGH (ref 11.5–15.5)
WBC: 4.7 10*3/uL (ref 4.0–10.5)
nRBC: 0 % (ref 0.0–0.2)

## 2020-12-02 LAB — BASIC METABOLIC PANEL
Anion gap: 9 (ref 5–15)
BUN: 37 mg/dL — ABNORMAL HIGH (ref 6–20)
CO2: 21 mmol/L — ABNORMAL LOW (ref 22–32)
Calcium: 7.4 mg/dL — ABNORMAL LOW (ref 8.9–10.3)
Chloride: 105 mmol/L (ref 98–111)
Creatinine, Ser: 2.35 mg/dL — ABNORMAL HIGH (ref 0.44–1.00)
GFR, Estimated: 24 mL/min — ABNORMAL LOW (ref >60)
Glucose, Bld: 142 mg/dL — ABNORMAL HIGH (ref 70–99)
Potassium: 3.4 mmol/L — ABNORMAL LOW (ref 3.5–5.1)
Sodium: 135 mmol/L (ref 135–145)

## 2020-12-02 LAB — GLUCOSE, CAPILLARY: Glucose-Capillary: 132 mg/dL — ABNORMAL HIGH (ref 70–99)

## 2020-12-02 LAB — MAGNESIUM: Magnesium: 2 mg/dL (ref 1.7–2.4)

## 2020-12-02 MED ORDER — METOPROLOL TARTRATE 25 MG PO TABS: 12.5000 mg | ORAL_TABLET | Freq: Two times a day (BID) | ORAL | 5 refills | Status: DC

## 2020-12-02 MED ORDER — CEPHALEXIN 250 MG PO CAPS: 250.0000 mg | ORAL_CAPSULE | Freq: Two times a day (BID) | ORAL | 0 refills | Status: DC

## 2020-12-02 MED ORDER — POTASSIUM CHLORIDE CRYS ER 20 MEQ PO TBCR
40.0000 meq | EXTENDED_RELEASE_TABLET | Freq: Once | ORAL | Status: AC
  Administered 2020-12-02: 40 meq via ORAL
  Filled 2020-12-02: qty 2

## 2020-12-02 MED ORDER — HYDROCODONE-ACETAMINOPHEN 10-325 MG PO TABS: 1.0000 | ORAL_TABLET | ORAL | 0 refills | Status: DC | PRN

## 2020-12-02 MED ORDER — LYRICA 50 MG PO CAPS: 50.0000 mg | ORAL_CAPSULE | Freq: Two times a day (BID) | ORAL | 0 refills | Status: DC

## 2020-12-02 MED ORDER — POTASSIUM CHLORIDE ER 20 MEQ PO TBCR: 20.0000 meq | EXTENDED_RELEASE_TABLET | Freq: Every day | ORAL | 0 refills | Status: DC

## 2020-12-02 MED ORDER — ALBUTEROL SULFATE (2.5 MG/3ML) 0.083% IN NEBU: 2.5000 mg | INHALATION_SOLUTION | RESPIRATORY_TRACT | Status: DC | PRN

## 2020-12-02 MED ORDER — AMIODARONE HCL 200 MG PO TABS: 200.0000 mg | ORAL_TABLET | ORAL | 1 refills | Status: DC

## 2020-12-02 NOTE — TOC Transition Note (Signed)
Transition of Care [facility]) - CM/SW Discharge Note   Patient Details  Name: LYNELL GREENHOUSE MRN: [id number] Date of Birth: May 21, 1966  [facility] CM/SW Contact:  Natasha Bence, LCSW Phone Number: 12/02/2020, 1:01 PM   Clinical Narrative:    CSW received notification of patient's readiness for discharge. CSW contacted Marianna Fuss and Tami with [facility] Both reported that they were unaware of possible weekend discharge but agreeable to accomodate patient for discharge on 12/02/2020. Nurse to call report. [place] to transport patient. TOC signing off.   Final next level of care: Skilled Nursing Facility Barriers to Discharge: Barriers Resolved   Patient Goals and CMS Choice Patient states their goals for this hospitalization and ongoing recovery are:: Rehab with SNF CMS Medicare.gov Compare Post Acute Care list provided to:: Patient Choice offered to / list presented to : Patient  Discharge Placement                Patient to be transferred to facility by: [facility] Name of family member notified: Tyshell Ramberg Patient and family notified of of transfer: 12/02/20  Discharge Plan and Services In-house Referral: Clinical Social Work Discharge Planning Services: CM Consult Post Acute Care Choice: [place]          DME Arranged: N/A DME Agency: NA       HH Arranged: NA HH Agency: NA        Social Determinants of Health (SDOH) Interventions     Readmission Risk Interventions Readmission Risk Prevention Plan 12/02/2020 11/30/2020 10/11/2020  Transportation Screening Complete Complete Complete  PCP or Specialist Appt within 5-7 Days - - Complete  PCP or Specialist Appt within 3-5 Days Complete - -  Home Care Screening - - Complete  Medication Review (RN CM) - - Complete  HRI or Home Care Consult Complete Complete -  Social Work Consult for [place] Planning/Counseling Complete Complete -  Palliative Care Screening Not Applicable  Not Applicable -  Medication Review Press photographer) Complete Complete -  Some recent data might be hidden

## 2020-12-02 NOTE — Discharge Instructions (Signed)
1)You are taking Xarelto which is a blood thinner so please Avoid ibuprofen/Advil/Aleve/Motrin/Goody Powders/Naproxen/BC powders/Meloxicam/Diclofenac/Indomethacin and other Nonsteroidal anti-inflammatory medications as these will make you more likely to bleed and can cause stomach ulcers, can also cause Kidney problems.   2)oral amiodarone 400mg  bid x 7 days, then 200mg  bid x 2 weeks, then 200mg  daily after that  3) CBC and BMP blood test every Wednesday for today with starting 12/06/2020

## 2020-12-02 NOTE — Progress Notes (Signed)
Report called to the [facility]. 

## 2020-12-02 NOTE — Discharge Summary (Signed)
[name], is a 55 y.o. female  DOB 1966-08-16  MRN [id number].  Admission date:  11/29/2020  Admitting Physician  Arnoldo Lenis, MD  Discharge Date:  12/02/2020   Primary MD  Frazier Richards, MD  Recommendations for primary care physician for things to follow:   1)You are taking Xarelto which is a blood thinner so please Avoid ibuprofen/Advil/Aleve/Motrin/Goody Powders/Naproxen/BC powders/Meloxicam/Diclofenac/Indomethacin and other Nonsteroidal anti-inflammatory medications as these will make you more likely to bleed and can cause stomach ulcers, can also cause Kidney problems.   2)oral amiodarone $RemoveBeforeDEI'400mg'wrKBBfvQzslWLikS$  bid x 7 days, then $RemoveBe'200mg'JTtBZWuUo$  bid x 2 weeks, then $RemoveBef'200mg'XUKUjznNzz$  daily after that  3) CBC and BMP blood test every Wednesday for today with starting 12/06/2020   Admission Diagnosis  Acute exacerbation of CHF (congestive heart failure) ([place]) [I50.9] Acute respiratory failure with hypoxia ([place]) [J96.01] Pulmonary emboli (HCC) [I26.99] Elevated d-dimer [R79.89] Acute on chronic congestive heart failure, unspecified heart failure type (HCC) [I50.9]   Discharge Diagnosis  Acute exacerbation of CHF (congestive heart failure) (HCC) [I50.9] Acute respiratory failure with hypoxia (HCC) [J96.01] Pulmonary emboli (HCC) [I26.99] Elevated d-dimer [R79.89] Acute on chronic congestive heart failure, unspecified heart failure type (HCC) [I50.9]    Principal Problem:   Acute exacerbation of CHF (congestive heart failure) (HCC) Active Problems:   Peripheral vascular disease, unspecified (HCC)   Hyperlipidemia LDL goal <70   DM2 (diabetes mellitus, type 2) (HCC)   Chronic back pain   Acute respiratory failure with hypoxia (HCC)   Atrial fibrillation with rapid ventricular response (HCC)   Hyperlipidemia   Gastroesophageal reflux disease   Chronic kidney disease   Essential hypertension   Leukopenia   Elevated brain  natriuretic peptide (BNP) level   Elevated d-dimer   Prolonged QT interval      Past Medical History:  Diagnosis Date  . Abnormal stress test    a. 02/2017 MV: large region of fixed perfusion defect in basal to mid inf, mid-dist inflat walls, EF 43%. No ischemia (EF 55-60% by f/u echo).  . Arthritis   . Asthma   . Carotid arterial disease ([place])    a. 09/2017 Carotid U/S: 40-49% bilat ICA stenosis.  . Chronic back pain   . Coronary artery calcification seen on CT scan    a. 11/2017 CT Abd/Pelvis: Multi vessel coronary vascular Ca2+.  . Depression   . Diabetes mellitus   . Diabetic neuropathy ([place])   . Difficult intubation    DIFFICULT AIRWAY/FYI  . Family history of adverse reaction to anesthesia    mother had difficlty waking   . Femoral-popliteal bypass graft occlusion, left ([place]) 12/02/2017  . GERD (gastroesophageal reflux disease)   . History of echocardiogram    a. 03/2017 Echo: EF 55-60%, mild LVH, nl RV fxn.  . Hyperlipidemia   . Ischemic cardiomyopathy    a. 04/2018 Echo: EF 30-35%, Gr2 DD, mild LVH. Mild MR. Mildly dil LA, mod dil RV w/ mod red RV fxn, mild TR, PASP 4mmHg.  . NSTEMI (non-ST elevated  myocardial infarction) (HCC)    a. 05/2018 in setting of Afib, sepsis, and post-op L AKA. Peak trop 7.7. EF 30-35% by echo-->cath not performed 2/2 renal failure.  . 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 PAD (peripheral artery disease) (HCC)    a. S/p L fem-pop bypass; b. 11/2017 s/p Aortobifem bypass 2/2 graft occlusion; c. 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  . Paroxysmal atrial fibrillation with rapid ventricular response (HCC) 12/02/2017   a. CHA2DS2VASc = 3-->Xarelto; b. 05/2018 Recurrent Afib-->amio.  Marland Kitchen Renal insufficiency   . Ulcer    Foot    Past Surgical History:  Procedure Laterality Date  . ABDOMINAL AORTAGRAM  June 15, 2014  . ABDOMINAL AORTAGRAM N/A 06/15/2014   Procedure: ABDOMINAL Ronny Flurry;  Surgeon: Nada Libman, MD;  Location: [facility] CATH LAB;  Service: Cardiovascular;  Laterality: N/A;  . ABDOMINAL AORTAGRAM N/A 11/22/2014   Procedure: ABDOMINAL AORTAGRAM;  Surgeon: Nada Libman, MD;  Location: [facility] CATH LAB;  Service: Cardiovascular;  Laterality: N/A;  . ABDOMINAL AORTOGRAM W/LOWER EXTREMITY N/A 01/07/2017   Procedure: Abdominal Aortogram w/Lower Extremity;  Surgeon: Nada Libman, MD;  Location: [facility] INVASIVE CV LAB;  Service: Cardiovascular;  Laterality: N/A;  . ABDOMINAL AORTOGRAM W/LOWER EXTREMITY N/A 10/31/2017   Procedure: ABDOMINAL AORTOGRAM W/LOWER EXTREMITY;  Surgeon: Sherren Kerns, MD;  Location: [facility] INVASIVE CV LAB;  Service: Cardiovascular;  Laterality: N/A;  . ABDOMINAL AORTOGRAM W/LOWER EXTREMITY N/A 03/24/2018   Procedure: ABDOMINAL AORTOGRAM W/LOWER EXTREMITY;  Surgeon: Nada Libman, MD;  Location: [facility] INVASIVE CV LAB;  Service: Cardiovascular;  Laterality: N/A;  . AMPUTATION Left 04/26/2018   Procedure: AMPUTATION ABOVE KNEE;  Surgeon: Sherren Kerns, MD;  Location: [facility] OR;  Service: Vascular;  Laterality: Left;  . AORTA - BILATERAL FEMORAL ARTERY BYPASS GRAFT N/A 11/28/2017   Procedure: AORTA BIFEMORAL BYPASS USING HEMASHIELD GOLD GRAFT & REIMPLANT IMA;  Surgeon: Nada Libman, MD;  Location: [facility] OR;  Service: Vascular;  Laterality: N/A;  . AORTIC ARCH ANGIOGRAPHY N/A 10/31/2017   Procedure: AORTIC ARCH ANGIOGRAPHY;  Surgeon: Sherren Kerns, MD;  [facility] INVASIVE CV LAB;  Service: Cardiovascular;  Laterality: N/A;  . APPLICATION OF WOUND VAC  11/28/2017   Procedure: APPLICATION OF WOUND VAC;  Surgeon: Nada Libman, MD;  Location: [facility] OR;  Service: Vascular;;  . APPLICATION OF WOUND VAC Left 03/27/2018   Procedure: APPLICATION OF WOUND VAC LEFT GROIN USING PREVENA PLUS;  Surgeon: Nada Libman, MD;  Location: [facility] OR;  Service: Vascular;  Laterality: Left;  . ARTERIAL BYPASS SURGERY   07/05/2010   Right Common Femoral to below knee popliteal BPG  . BACK SURGERY      X's  2  . CARDIAC CATHETERIZATION    . CHOLECYSTECTOMY     Gall Bladder  . CYSTECTOMY Left    wrist  . EMBOLECTOMY Left 11/28/2017   Procedure: Left Lower Extremity Embolectomy, Left Tibial Peroneal Trunk Endarterectomy with Patch Angioplasty; Vein Harvest Small Saphenous Graft Left Lower Leg;  Surgeon: Maeola Harman, MD;  Location: [facility] OR;  Service: Vascular;  Laterality: Left;  . EYE SURGERY Left 02/23/2020   Dr. Luciana Axe  . FEMORAL-POPLITEAL BYPASS GRAFT Left 03/27/2018   Procedure: THROMBECTOMY OF LEFT FEMORAL TIBIAL BYPASS;  Surgeon: Nada Libman, MD;  Location: [facility] OR;  Service: Vascular;  Laterality: Left;  . FEMORAL-TIBIAL BYPASS GRAFT Left 03/27/2018   Procedure: BYPASS GRAFT FEMORAL-TIBIAL ARTERY LEFT REDO USING CRYOPRESERVED SAPHENOUS  VEIN 70cm;  Surgeon: Serafina Mitchell, MD;  Location: [facility] OR;  Service: Vascular;  Laterality: Left;  . INTERCOSTAL NERVE BLOCK  November 2015  . INTRAOPERATIVE ARTERIOGRAM  11/28/2017   Procedure: INTRA OPERATIVE ARTERIOGRAM OF LEFT LEG;  Surgeon: Serafina Mitchell, MD;  Location: [place];  Service: Vascular;;  . INTRAOPERATIVE ARTERIOGRAM Left 03/27/2018   Procedure: INTRA OPERATIVE ARTERIOGRAM TIMES TWO;  Surgeon: Serafina Mitchell, MD;  Location: [place];  Service: Vascular;  Laterality: Left;  . IR FLUORO GUIDE CV LINE RIGHT  03/20/2017  . IR FLUORO GUIDE CV LINE RIGHT  05/05/2018  . IR REMOVAL TUN CV CATH W/O FL  05/20/2018  . IR US GUIDE VASC ACCESS RIGHT  03/20/2017  . IR US GUIDE VASC ACCESS RIGHT  05/05/2018  . IRRIGATION AND DEBRIDEMENT BUTTOCKS Right 09/30/2016   Procedure: DEBRIDEMENT RIGHT  BUTTOCK WOUND;  Surgeon: Georganna Skeans, MD;  Location: [place];  Service: General;  Laterality: Right;  . left foot surgery    . PERIPHERAL VASCULAR CATHETERIZATION N/A 05/07/2016   Procedure: Abdominal Aortogram;  Surgeon: Serafina Mitchell, MD;  Location: [place] CV LAB;  Service: Cardiovascular;  Laterality: N/A;  . PERIPHERAL VASCULAR CATHETERIZATION  N/A 05/07/2016   Procedure: Lower Extremity Angiography;  Surgeon: Serafina Mitchell, MD;  Location: [place] CV LAB;  Service: Cardiovascular;  Laterality: N/A;  . PERIPHERAL VASCULAR CATHETERIZATION N/A 05/07/2016   Procedure: Aortic Arch Angiography;  Surgeon: Serafina Mitchell, MD;  Location: [place] CV LAB;  Service: Cardiovascular;  Laterality: N/A;  . PERIPHERAL VASCULAR CATHETERIZATION N/A 05/07/2016   Procedure: Upper Extremity Angiography;  Surgeon: Serafina Mitchell, MD;  Location: [place] CV LAB;  Service: Cardiovascular;  Laterality: N/A;  . PERIPHERAL VASCULAR CATHETERIZATION Right 05/07/2016   Procedure: Peripheral Vascular Balloon Angioplasty;  Surgeon: Serafina Mitchell, MD;  Location: [place] CV LAB;  Service: Cardiovascular;  Laterality: Right;  subclavian  . PERIPHERAL VASCULAR CATHETERIZATION Right 05/07/2016   Procedure: Peripheral Vascular Intervention;  Surgeon: Serafina Mitchell, MD;  Location: [place] CV LAB;  Service: Cardiovascular;  Laterality: Right;  External  Iliac  . SKIN GRAFT Right 2012   [name] by Dr. Nils Pyle- Right and Left Ankle  . SPINE SURGERY    . THROMBECTOMY FEMORAL ARTERY  11/28/2017   Procedure: THROMBECTOMY  & REVISION OF BILATERAL FEMORAL TO POPLETEAL ARTERIES;  Surgeon: Serafina Mitchell, MD;  Location: [place];  Service: Vascular;;  . TONSILLECTOMY    . TRANSMETATARSAL AMPUTATION Right 10/07/2020   Procedure: RIGHT BELOW KNEE AMPUTATION;  Surgeon: Erle Crocker, MD;  Location: [place];  Service: Orthopedics;  Laterality: Right;     HPI  from the history and physical done on the day of admission:   Chief Complaint: Shortness of breath  HPI: [name] is a 55 y.o. female with medical history significant for type 2 diabetes, diabetic polyneuropathy, GERD, hyperlipidemia, paroxysmal A. fib, ischemic cardiomyopathy, peripheral vascular disease post left above-the-knee amputation and Right BKA who presents to the emergency department via EMS  accompanied by her husband due to 1 week of cough (occasionally productive with clear sputum), chest congestion, nasal congestion and 3-day onset of shortness of breath.  Patient takes Lasix at baseline and she states that the dosage was being changed (recently reduced to 20 Mg from 80 mg due to worsening kidney function), otherwise she states that she has been compliant with the Lasix.  She also complains of increased abdominal girth which she  states was possibly due to fluid accommodation.  Home PT checked O2 sat today and was in the high 70s, this was re- checked by husband and was noted to be in the 80s, EMS was activated, and patient was sent to the ED for further evaluation.  She denies orthopnea, chest pain, abdominal pain, fever, chills, nausea or vomiting.   Patient was supposed to follow-up with a nephrologist regarding her kidney status, however, she is yet to have an appointment with a nephrologist. She is wheelchair-bound due to bilateral lower extremity amputation.  ED Course:  In the emergency department, she was noted to be hypoxic with an O2 sat of 74%, this improved to 100% on supplemental oxygen via [place] at 2 LPM.  Work-up in the ED showed leukopenia, BUN/creatinine 31/2.1 (baseline creatinine 2.3-2.7).  BNP 2,629, D-dimer 4.75, albumin 2.7.  SARS coronavirus 2 was negative. Chest x-ray showed cardiomegaly with mild vascular congestion with no focal consolidation IV Lasix 80 mg and was given.  IV Solu-Medrol 25 Mg x1 was given.  Hospitalist was asked to admit patient for further evaluation and management.    Hospital Course:   Brief Summary:- 55 y.o.femalewith medical history significant fortype 2 diabetes, diabetic polyneuropathy, GERD, hyperlipidemia, paroxysmal A. fib,ischemic cardiomyopathy,peripheral vascular disease post left above-the-knee amputation and Right BKA admitted on 11/29/2020 with acute hypoxic respiratory failure secondary to acute on chronic  CHF  A/p 1)HFrEF--patient with acute on chronic combined systolic and diastolic dysfunction CHF  -BNP 2629, Cxr with Pulmonary Venous congestion and cardiomegaly -- Repeat echo on 11/30/2020 with EF of 35 to 40% from 50 to 55% back in January 2020 with regional wall motion of normalities, -Echo from January 2020 also had grade 1 diastolic dysfunction -Patient declines LHC or other ischemia work-up -Treated with IV Lasix, weight is inaccurate as patient is a bilateral amputee -Cardiology consult/input appreciated -Soft BP precludes nitrates/hydralazine -avoid ACEI/ARB/ARNI/digoxin due to renal concerns -Discharged on torsemide 40 mg daily with potassium supplement   2) elevated D-dimer-- D-Dimer 4.75, PTA patient was on Xarelto for atrial fibrillation, VQ scan without definite PE findings -Lower Extremity venous Dopplers without acute DVT -Renal function precludes CTA chest -Patient received therapeutic Lovenox on admission,  --continue Xarelto  3) chronic atrial fibrillation--rate control was challenging, low EF precludes CCB use, -- Okay to use low-dose metoprolol, -- Amiodarone added by cardiology service -continue Xarelto for stroke prophylaxis  4)CKD IV---creatinine is currently around 2 which is close to her previous baseline  =--renally adjust medications, avoid nephrotoxic agents / dehydration  / hypotension -Continue bicarb tablets -Repeat BMP at facility on 130 12/06/2020  5) ambulatory dysfunction/wheelchair-bound status due to bilateral amputations--- patient with right BKA and left AKA----husband is primary caregiver -Physical therapy evaluation appreciated recommends SNF rehab  6)Dm2-A1c is 5.7 reflecting excellent diabetic control PTA -Resume home diabetic regimen   7)PAD--status post prior right BKA and left AKA, continue Xarelto, and crestor  8) possible UTI--- treated with IV Rocephin, discharged on Keflex adjusted for renal function pending urine  culture  9) acute hypoxic respiratory failure--- secondary to #1 above, PE less likely, -Resolved with diuresis --Patient remains on room air  10)Presumed CAD--elevated troponin, chest pain-free, unable to give aspirin due to Xarelto use-we will restart metoprolol if BP allows, drop in EF noted on echo, wall motion normalities noted on echo --Patient declines LHC or other ischemia work-up - 11)Chronic Anemia--- suspect some component of anemia of CKD, hemoglobin currently close to baseline, no ongoing bleeding noted -Watch closely  as patient is on Xarelto -Repeat CBC on Wednesday, 12/06/2020  Disposition--- SNF rehab  Disposition: The patient is from: Home  Anticipated d/c is to: SNF  Code Status :  -  Code Status: Full Code   Family Communication:    NA (patient is alert, awake and coherent)   Consults  :  Cardio  DVT Prophylaxis  :   - SCDs *  SCDs Start: 11/29/20 1954 Rivaroxaban (XARELTO) tablet 15 mg    Discharge Condition: stable  Follow UP   Contact information for after-discharge care    [facility] .   Service: Skilled Nursing Contact information: [address] [address] [phone]                   Diet and Activity recommendation:  As advised  Discharge Instructions    Discharge Instructions    Call MD for:  difficulty breathing, headache or visual disturbances   Complete by: As directed    Call MD for:  persistant dizziness or light-headedness   Complete by: As directed    Call MD for:  persistant nausea and vomiting   Complete by: As directed    Call MD for:  temperature >100.4   Complete by: As directed    Diet - low sodium heart healthy   Complete by: As directed    Diet Carb Modified   Complete by: As directed    Discharge instructions   Complete by: As directed    1)You are taking Xarelto which is a blood thinner so please Avoid  ibuprofen/Advil/Aleve/Motrin/Goody Powders/Naproxen/BC powders/Meloxicam/Diclofenac/Indomethacin and other Nonsteroidal anti-inflammatory medications as these will make you more likely to bleed and can cause stomach ulcers, can also cause Kidney problems.   2)oral amiodarone $RemoveBeforeDEI'400mg'RVpaJhlWnIiphwUw$  bid x 7 days, then $RemoveBe'200mg'nVpebCmwT$  bid x 2 weeks, then $RemoveBef'200mg'idwsipSkmK$  daily after that  3) CBC and BMP blood test every Wednesday for today with starting 12/06/2020   Increase activity slowly   Complete by: As directed         Discharge Medications     Allergies as of 12/02/2020      Reactions   Lactose Intolerance (gi) Diarrhea      Medication List    STOP taking these medications   furosemide 20 MG tablet Commonly known as: Lasix   furosemide 40 MG tablet Commonly known as: LASIX   oxyCODONE 5 MG immediate release tablet Commonly known as: Oxy IR/ROXICODONE     TAKE these medications   albuterol 2 MG tablet Commonly known as: PROVENTIL Take 2 mg by mouth daily as needed for shortness of breath.   amiodarone 200 MG tablet Commonly known as: Pacerone Take 1 tablet (200 mg total) by mouth See admin instructions. oral amiodarone $RemoveBeforeD'400mg'WxnEZsOFzPqLsb$  bid x 7 days, then $RemoveBe'200mg'bMXMNsHyv$  bid x 2 weeks, then $RemoveBef'200mg'AdVVsqXAqn$  daily after that   amitriptyline 25 MG tablet Commonly known as: ELAVIL Take 1 tablet (25 mg total) by mouth at bedtime.   cephALEXin 250 MG capsule Commonly known as: KEFLEX Take 1 capsule (250 mg total) by mouth 2 (two) times daily for 3 days.   cyanocobalamin 1000 MCG tablet Take 1 tablet (1,000 mcg total) by mouth daily.   dicyclomine 10 MG capsule Commonly known as: BENTYL Take 10 mg by mouth 4 (four) times daily as needed for spasms.   HYDROcodone-acetaminophen 10-325 MG tablet Commonly known as: NORCO Take 1 tablet by mouth every 2 (two) hours as needed  for moderate pain (pain). What changed: reasons to take this   insulin glargine 100 UNIT/ML injection Commonly known as: LANTUS Inject 0.18 mLs (18 Units total)  into the skin 2 (two) times daily. What changed: how much to take   insulin lispro 100 UNIT/ML injection Commonly known as: HumaLOG For glucose 121-150 use TWO units,  for 151-200 use FOUR units, for 201-250 use SIX units, for 251-300 use EIGHT units, for 301-350 use TEN units. For blood sugars greater than 350 call MD. What changed:   how much to take  how to take this  when to take this   Lyrica 50 MG capsule Generic drug: pregabalin Take 1 capsule by mouth twice daily What changed:   how much to take  when to take this  additional instructions   metoprolol tartrate 25 MG tablet Commonly known as: LOPRESSOR Take 0.5 tablets (12.5 mg total) by mouth 2 (two) times daily.   Narcan 4 MG/0.1ML Liqd nasal spray kit Generic drug: naloxone Place 1 spray into the nose once as needed (opioid overdose).   omeprazole 40 MG capsule Commonly known as: PRILOSEC Take 40 mg by mouth daily.   Potassium Chloride ER 20 MEQ Tbcr Take 20 mEq by mouth daily. 1 tab daily by mouth   Rivaroxaban 15 MG Tabs tablet Commonly known as: XARELTO Take 1 tablet (15 mg total) by mouth daily with supper.   rosuvastatin 20 MG tablet Commonly known as: CRESTOR Take 1 tablet (20 mg total) by mouth daily at 6 PM.   sodium bicarbonate 650 MG tablet Take 1 tablet (650 mg total) by mouth 2 (two) times daily.       Major procedures and Radiology Reports - PLEASE review detailed and final reports for all details, in brief -   NM Pulmonary Perfusion  Result Date: 11/30/2020 CLINICAL DATA:  Shortness of breath EXAM: NUCLEAR MEDICINE PERFUSION LUNG SCAN TECHNIQUE: Perfusion images were obtained in multiple projections after intravenous injection of radiopharmaceutical. Views: Anterior, posterior, left lateral, right lateral, RPO, LPO, RAO, LAO RADIOPHARMACEUTICALS:  4.4 mCi Tc-53m MAA IV COMPARISON:  Chest radiograph November 29, 2020 FINDINGS: There is a segmental area of decreased uptake in the  anterior segment of the right upper lobe. Scattered subsegmental defects noted elsewhere. There is cardiomegaly. IMPRESSION: There is decreased radiotracer uptake in the anterior segment of the right upper lobe as well as scattered areas of decreased radiotracer uptake elsewhere. Pulmonary embolism cannot be excluded in this circumstance. This circumstance is felt to warrant consideration for CT angiogram chest to further evaluate. If there is a contraindication to contrast administration, it may be reasonable to consider lower extremity venous duplex ultrasound to assess for possible lower extremity deep venous thrombosis. Cardiomegaly noted. Electronically Signed   By: Bretta Bang III M.D.   On: 11/30/2020 07:51   US Venous Img Lower Bilateral (DVT)  Result Date: 11/30/2020 CLINICAL DATA:  Bilateral leg pain and swelling. Right below-knee amputation. Left above knee amputation. EXAM: BILATERAL LOWER EXTREMITY VENOUS DOPPLER ULTRASOUND TECHNIQUE: Gray-scale sonography with graded compression, as well as color Doppler and duplex ultrasound were performed to evaluate the lower extremity deep venous systems from the level of the common femoral vein and including the common femoral, femoral, profunda femoral, popliteal and calf veins including the posterior tibial, peroneal and gastrocnemius veins when visible. The superficial great saphenous vein was also interrogated. Spectral Doppler was utilized to evaluate flow at rest and with distal augmentation maneuvers in the common femoral, femoral and popliteal  veins. COMPARISON:  None. FINDINGS: RIGHT LOWER EXTREMITY Common Femoral Vein: No evidence of thrombus. Normal compressibility, respiratory phasicity and response to augmentation. Saphenofemoral Junction: No evidence of thrombus. Normal compressibility and flow on color Doppler imaging. Profunda Femoral Vein: No evidence of thrombus. Normal compressibility and flow on color Doppler imaging. Femoral Vein: No  evidence of thrombus. Normal compressibility, respiratory phasicity and response to augmentation. Popliteal Vein: No evidence of thrombus. Normal compressibility, respiratory phasicity and response to augmentation. Other Findings:  Limited exam due to tissue edema. LEFT LOWER EXTREMITY Common Femoral Vein: No evidence of thrombus. Normal compressibility, respiratory phasicity and response to augmentation. Saphenofemoral Junction: No evidence of thrombus. Normal compressibility and flow on color Doppler imaging. Profunda Femoral Vein: No evidence of thrombus. Normal compressibility and flow on color Doppler imaging. Femoral Vein: No evidence of thrombus. Normal compressibility, respiratory phasicity and response to augmentation. Limited exam due to soft tissue edema. Portable exam. IMPRESSION: No evidence of deep venous thrombosis in either lower extremity. Examination limited by tissue edema. Electronically Signed   By: Marlan Palau M.D.   On: 11/30/2020 13:34   DG Chest Port 1 View  Result Date: 11/29/2020 CLINICAL DATA:  55 year female with shortness of breath. EXAM: PORTABLE CHEST 1 VIEW COMPARISON:  Chest radiograph dated 05/02/2018 FINDINGS: Dated there is cardiomegaly with mild vascular congestion. Left lung base linear atelectasis/scarring. No focal consolidation, pleural effusion or pneumothorax. No acute osseous pathology. IMPRESSION: Cardiomegaly with mild vascular congestion. No focal consolidation. Electronically Signed   By: Elgie Collard M.D.   On: 11/29/2020 17:50   ECHOCARDIOGRAM COMPLETE  Result Date: 11/30/2020    ECHOCARDIOGRAM REPORT   Patient Name:   LANDEN BREELAND Sevigny Date of Exam: 11/30/2020 Medical Rec #:  [id number]      Height:       67.0 in Accession #:    [id number]     Weight:       160.1 lb Date of Birth:  June 26, 1966      BSA:          1.840 m Patient Age:    54 years       BP:           100/54 mmHg Patient Gender: F              HR:           86 bpm. [facility] Procedure: 2D Echo Indications:    CHF-Acute Diastolic I50.31  History:        Patient has prior history of Echocardiogram examinations, most                 recent 12/16/2018. CHF, Arrythmias:Atrial Fibrillation; Risk                 Factors:Diabetes, Dyslipidemia and Hypertension.                 Peripheral vascular disease, Acute Kidney Injury, Elevated BNP.  Sonographer:    Jeryl Columbia RDCS (AE) Referring Phys: [id number] OLADAPO ADEFESO IMPRESSIONS  1. Global hypokinesis with more prominent anteroseptal and apical hypokinesis. . Left ventricular ejection fraction, by estimation, is 35 to 40%. The left ventricle has moderately decreased function. The left ventricle demonstrates regional wall motion abnormalities (see scoring diagram/findings for description). Left ventricular diastolic parameters are indeterminate.  2. Right ventricular systolic function is normal. The right ventricular size is normal.  3. Left atrial size was severely dilated.  4. The pericardial effusion  is circumferential.  5. The mitral valve is normal in structure. No evidence of mitral valve regurgitation. No evidence of mitral stenosis.  6. The aortic valve has an indeterminant number of cusps. Aortic valve regurgitation is not visualized. No aortic stenosis is present.  7. The inferior vena cava is normal in size with greater than 50% respiratory variability, suggesting right atrial pressure of 3 mmHg. FINDINGS  Left Ventricle: Global hypokinesis with more prominent anteroseptal and apical hypokinesis. Left ventricular ejection fraction, by estimation, is 35 to 40%. The left ventricle has moderately decreased function. The left ventricle demonstrates regional wall motion abnormalities. Definity contrast agent was given IV to delineate the left ventricular endocardial borders. The left ventricular internal cavity size was normal in size. There is no left ventricular hypertrophy. Left ventricular diastolic parameters are  indeterminate. Right Ventricle: The right ventricular size is normal. No increase in right ventricular wall thickness. Right ventricular systolic function is normal. Left Atrium: Left atrial size was severely dilated. Right Atrium: Right atrial size was normal in size. Pericardium: Trivial pericardial effusion is present. The pericardial effusion is circumferential. Mitral Valve: The mitral valve is normal in structure. There is mild thickening of the mitral valve leaflet(s). There is mild calcification of the mitral valve leaflet(s). Mild mitral annular calcification. No evidence of mitral valve regurgitation. No evidence of mitral valve stenosis. Tricuspid Valve: The tricuspid valve is normal in structure. Tricuspid valve regurgitation is not demonstrated. No evidence of tricuspid stenosis. Aortic Valve: The aortic valve has an indeterminant number of cusps. Aortic valve regurgitation is not visualized. No aortic stenosis is present. Aortic valve mean gradient measures 6.6 mmHg. Aortic valve peak gradient measures 13.8 mmHg. Aortic valve area, by VTI measures 1.28 cm. Pulmonic Valve: The pulmonic valve was not well visualized. Pulmonic valve regurgitation is not visualized. No evidence of pulmonic stenosis. Aorta: The aortic root is normal in size and structure. Pulmonary Artery: Indeterminant PASP, inadequate TR jet. Venous: The inferior vena cava is normal in size with greater than 50% respiratory variability, suggesting right atrial pressure of 3 mmHg. IAS/Shunts: No atrial level shunt detected by color flow Doppler.  LEFT VENTRICLE PLAX 2D LVIDd:         5.55 cm  Diastology LVIDs:         4.22 cm  LV e' medial:    4.78 cm/s LV PW:         1.00 cm  LV E/e' medial:  29.7 LV IVS:        0.95 cm  LV e' lateral:   7.05 cm/s LVOT diam:     1.80 cm  LV E/e' lateral: 20.1 LV SV:         48 LV SV Index:   26 LVOT Area:     2.54 cm  RIGHT VENTRICLE RV S prime:     10.80 cm/s LEFT ATRIUM             Index       RIGHT  ATRIUM           Index LA diam:        4.50 cm 2.45 cm/m  RA Area:     18.60 cm LA Vol (A2C):   64.8 ml 35.23 ml/m RA Volume:   54.40 ml  29.57 ml/m LA Vol (A4C):   85.6 ml 46.53 ml/m LA Biplane Vol: 77.2 ml 41.97 ml/m  AORTIC VALVE AV Area (Vmax):    1.17 cm AV Area (Vmean):   1.30 cm  AV Area (VTI):     1.28 cm AV Vmax:           185.81 cm/s AV Vmean:          120.834 cm/s AV VTI:            0.370 m AV Peak Grad:      13.8 mmHg AV Mean Grad:      6.6 mmHg LVOT Vmax:         85.33 cm/s LVOT Vmean:        61.694 cm/s LVOT VTI:          0.187 m LVOT/AV VTI ratio: 0.50  AORTA Ao Root diam: 2.80 cm MITRAL VALVE MV Area (PHT): 3.16 cm     SHUNTS MV Decel Time: 240 msec     Systemic VTI:  0.19 m MV E velocity: 142.00 cm/s  Systemic Diam: 1.80 cm MV A velocity: 90.80 cm/s MV E/A ratio:  1.56 Dina Rich MD Electronically signed by Dina Rich MD Signature Date/Time: 11/30/2020/12:06:11 PM    Final     Micro Results   Recent Results (from the past 240 hour(s))  Blood Culture (routine x 2)     Status: None (Preliminary result)   Collection Time: 11/29/20  5:30 PM   Specimen: BLOOD  Result Value Ref Range Status   Specimen Description BLOOD RIGHT ANTECUBITAL  Final   Special Requests   Final    BOTTLES DRAWN AEROBIC AND ANAEROBIC Blood Culture results may not be optimal due to an inadequate volume of blood received in culture bottles   Culture   Final    NO GROWTH 3 DAYS Performed at [facility], [address] [place], [place] [address]    Report Status PENDING  Incomplete  Resp Panel by RT-PCR (Flu A&B, Covid) Nasopharyngeal Swab     Status: None   Collection Time: 11/29/20  5:52 PM   Specimen: Nasopharyngeal Swab; Nasopharyngeal(NP) swabs in vial transport medium  Result Value Ref Range Status   SARS Coronavirus 2 by RT PCR NEGATIVE NEGATIVE Final    Comment: (NOTE) SARS-CoV-2 target nucleic acids are NOT DETECTED.  The SARS-CoV-2 RNA is generally detectable in upper  respiratory specimens during the acute phase of infection. The lowest concentration of SARS-CoV-2 viral copies this assay can detect is 138 copies/mL. A negative result does not preclude SARS-Cov-2 infection and should not be used as the sole basis for treatment or other patient management decisions. A negative result may occur with  improper specimen collection/handling, submission of specimen other than nasopharyngeal swab, presence of viral mutation(s) within the areas targeted by this assay, and inadequate number of viral copies(<138 copies/mL). A negative result must be combined with clinical observations, patient history, and epidemiological information. The expected result is Negative.  Fact Sheet for Patients:  BloggerCourse.com  Fact Sheet for Healthcare Providers:  SeriousBroker.it  This test is no t yet approved or cleared by the [place] FDA and  has been authorized for detection and/or diagnosis of SARS-CoV-2 by FDA under an Emergency Use Authorization (EUA). This EUA will remain  in effect (meaning this test can be used) for the duration of the COVID-19 declaration under Section 564(b)(1) of the Act, 21 U.S.C.section 360bbb-3(b)(1), unless the authorization is terminated  or revoked sooner.       Influenza A by PCR NEGATIVE NEGATIVE Final   Influenza B by PCR NEGATIVE NEGATIVE Final    Comment: (NOTE) The Xpert Xpress SARS-CoV-2/FLU/RSV plus assay is intended as an  aid in the diagnosis of influenza from Nasopharyngeal swab specimens and should not be used as a sole basis for treatment. Nasal washings and aspirates are unacceptable for Xpert Xpress SARS-CoV-2/FLU/RSV testing.  Fact Sheet for Patients: EntrepreneurPulse.com.au  Fact Sheet for Healthcare Providers: IncredibleEmployment.be  This test is not yet approved or cleared by the [place] FDA and has been  authorized for detection and/or diagnosis of SARS-CoV-2 by FDA under an Emergency Use Authorization (EUA). This EUA will remain in effect (meaning this test can be used) for the duration of the COVID-19 declaration under Section 564(b)(1) of the Act, 21 U.S.C. section 360bbb-3(b)(1), unless the authorization is terminated or revoked.  Performed at [facility], [address] [place], [place] [address]   Blood Culture (routine x 2)     Status: None (Preliminary result)   Collection Time: 11/29/20  8:02 PM   Specimen: BLOOD RIGHT ARM  Result Value Ref Range Status   Specimen Description BLOOD RIGHT ARM  Final   Special Requests   Final    BOTTLES DRAWN AEROBIC ONLY Blood Culture results may not be optimal due to an inadequate volume of blood received in culture bottles   Culture   Final    NO GROWTH 3 DAYS Performed at [facility], [address] [place], [place] [address]    Report Status PENDING  Incomplete    Today   Subjective    Sherah Overdorf today has no new complaints shob is much better No chest pain No fever  Or chills   No Nausea, Vomiting or Diarrhea     Voiding well   Patient has been seen and examined prior to discharge   Objective   Blood pressure (!) 89/66, pulse 77, temperature (!) 97.3 F (36.3 C), resp. rate 18, height $RemoveBe'5\' 7"'[id number]$  (1.702 m), weight 101.3 kg, SpO2 96 %.   Intake/Output Summary (Last 24 hours) at 12/02/2020 1209 Last data filed at 12/02/2020 0700 Gross per 24 hour  Intake 480 ml  Output 1300 ml  Net -820 ml    Exam Gen:- Awake Alert, no acute distress , speaking in complete sentences HEENT:- [place].AT, No sclera icterus, legally blind Neck-Supple Neck,No JVD,.  Lungs-improved air movement, no wheezing CV- S1, S2 normal, regular Abd-  +ve B.Sounds, Abd Soft, No tenderness,    Extremity-Rt BKA, Lt AKA  Psych-affect is appropriate, oriented x3 Neuro-generalized weakness, no new focal deficits, no tremors   Data Review   CBC w Diff:   Lab Results  Component Value Date   WBC 4.7 12/02/2020   HGB 8.6 (L) 12/02/2020   HCT 29.8 (L) 12/02/2020   PLT 194 12/02/2020   LYMPHOPCT 22 11/29/2020   MONOPCT 9 11/29/2020   EOSPCT 1 11/29/2020   BASOPCT 1 11/29/2020    CMP:  Lab Results  Component Value Date   NA 135 12/02/2020   NA 137 01/22/2019   K 3.4 (L) 12/02/2020   CL 105 12/02/2020   CO2 21 (L) 12/02/2020   BUN 37 (H) 12/02/2020   BUN 29 (H) 01/22/2019   CREATININE 2.35 (H) 12/02/2020   CREATININE 1.24 (H) 08/12/2017   PROT 5.8 (L) 11/30/2020   ALBUMIN 2.8 (L) 12/01/2020   BILITOT 0.6 11/30/2020   ALKPHOS 80 11/30/2020   AST 9 (L) 11/30/2020   ALT 9 11/30/2020  . Total Discharge time is about 33 minutes  Roxan Hockey M.D on 12/02/2020 at 12:09 PM  Go to [email or web address] -  for contact info  [name] -  Office  [phone]

## 2020-12-03 LAB — URINE CULTURE
Culture: <10000 — AB
Special Requests: NORMAL

## 2020-12-04 ENCOUNTER — Telehealth: Payer: Self-pay | Admitting: Cardiology

## 2020-12-04 LAB — CULTURE, BLOOD (ROUTINE X 2)
Culture: NO GROWTH
Culture: NO GROWTH

## 2020-12-04 NOTE — Telephone Encounter (Signed)
New message    Pt c/o medication issue:  1. Name of Medication: amiodarone (PACERONE) 200 MG tablet [[id number]   2. How are you currently taking this medication (dosage and times per day)? hasnt started yet   3. Are you having a reaction (difficulty breathing--STAT)? no  4. What is your medication issue? Needs to clarify when to start giving the patient the medication , if they give it to her now it will throw off the dosing ?

## 2020-12-04 NOTE — Telephone Encounter (Signed)
Tammy at [facility] notified and voiced understanding.

## 2020-12-04 NOTE — Telephone Encounter (Signed)
Spoke with Tammy who states that Amiodarone has been started on Sat evening. Her question is that one day pt will be taking 400 mg in the am and taking 200 mg that evening. Will this dosing be ok? Please advise.

## 2020-12-04 NOTE — Telephone Encounter (Signed)
Left message to return call 

## 2020-12-04 NOTE — Telephone Encounter (Signed)
The discharge instructions are amiodarone 400mg  bid x 7 days, then 200mg  bid x 2 weeks, then 200mg  daily after that long term  Zandra Abts MD

## 2020-12-05 ENCOUNTER — Other Ambulatory Visit: Payer: Self-pay | Admitting: Adult Health

## 2020-12-05 ENCOUNTER — Non-Acute Institutional Stay (SKILLED_NURSING_FACILITY): Payer: Medicare Other | Admitting: Adult Health

## 2020-12-05 ENCOUNTER — Encounter: Payer: Self-pay | Admitting: Adult Health

## 2020-12-05 DIAGNOSIS — M545 Low back pain, unspecified: Secondary | ICD-10-CM

## 2020-12-05 DIAGNOSIS — K219 Gastro-esophageal reflux disease without esophagitis: Secondary | ICD-10-CM

## 2020-12-05 DIAGNOSIS — E8809 Other disorders of plasma-protein metabolism, not elsewhere classified: Secondary | ICD-10-CM

## 2020-12-05 DIAGNOSIS — I130 Hypertensive heart and chronic kidney disease with heart failure and stage 1 through stage 4 chronic kidney disease, or unspecified chronic kidney disease: Secondary | ICD-10-CM | POA: Diagnosis not present

## 2020-12-05 DIAGNOSIS — E1169 Type 2 diabetes mellitus with other specified complication: Secondary | ICD-10-CM

## 2020-12-05 DIAGNOSIS — E1142 Type 2 diabetes mellitus with diabetic polyneuropathy: Secondary | ICD-10-CM

## 2020-12-05 DIAGNOSIS — Z794 Long term (current) use of insulin: Secondary | ICD-10-CM

## 2020-12-05 DIAGNOSIS — E1122 Type 2 diabetes mellitus with diabetic chronic kidney disease: Secondary | ICD-10-CM | POA: Insufficient documentation

## 2020-12-05 DIAGNOSIS — I7092 Chronic total occlusion of artery of the extremities: Secondary | ICD-10-CM

## 2020-12-05 DIAGNOSIS — I480 Paroxysmal atrial fibrillation: Secondary | ICD-10-CM | POA: Diagnosis not present

## 2020-12-05 DIAGNOSIS — D631 Anemia in chronic kidney disease: Secondary | ICD-10-CM | POA: Insufficient documentation

## 2020-12-05 DIAGNOSIS — E785 Hyperlipidemia, unspecified: Secondary | ICD-10-CM

## 2020-12-05 DIAGNOSIS — E46 Unspecified protein-calorie malnutrition: Secondary | ICD-10-CM

## 2020-12-05 DIAGNOSIS — M5137 Other intervertebral disc degeneration, lumbosacral region: Secondary | ICD-10-CM

## 2020-12-05 DIAGNOSIS — Z89511 Acquired absence of right leg below knee: Secondary | ICD-10-CM

## 2020-12-05 DIAGNOSIS — J9611 Chronic respiratory failure with hypoxia: Secondary | ICD-10-CM

## 2020-12-05 DIAGNOSIS — M5030 Other cervical disc degeneration, unspecified cervical region: Secondary | ICD-10-CM

## 2020-12-05 DIAGNOSIS — I5041 Acute combined systolic (congestive) and diastolic (congestive) heart failure: Secondary | ICD-10-CM

## 2020-12-05 DIAGNOSIS — S78112A Complete traumatic amputation at level between left hip and knee, initial encounter: Secondary | ICD-10-CM

## 2020-12-05 DIAGNOSIS — G8929 Other chronic pain: Secondary | ICD-10-CM

## 2020-12-05 DIAGNOSIS — N184 Chronic kidney disease, stage 4 (severe): Secondary | ICD-10-CM

## 2020-12-05 LAB — GLUCOSE, CAPILLARY: Glucose-Capillary: 133 mg/dL — ABNORMAL HIGH (ref 70–99)

## 2020-12-05 MED ORDER — INSULIN LISPRO (1 UNIT DIAL) 100 UNIT/ML (KWIKPEN)
4.0000 [IU] | PEN_INJECTOR | Freq: Three times a day (TID) | SUBCUTANEOUS | 0 refills | Status: AC
Start: 2020-12-05 — End: ?

## 2020-12-05 MED ORDER — RIVAROXABAN 15 MG PO TABS: 15.0000 mg | ORAL_TABLET | Freq: Every day | ORAL | 0 refills | Status: DC

## 2020-12-05 MED ORDER — POTASSIUM CHLORIDE ER 20 MEQ PO TBCR: 20.0000 meq | EXTENDED_RELEASE_TABLET | Freq: Every day | ORAL | 0 refills | Status: DC

## 2020-12-05 MED ORDER — OMEPRAZOLE 40 MG PO CPDR: 40.0000 mg | DELAYED_RELEASE_CAPSULE | Freq: Every day | ORAL | 0 refills | Status: AC

## 2020-12-05 MED ORDER — HYDROCODONE-ACETAMINOPHEN 10-325 MG PO TABS
1.0000 | ORAL_TABLET | Freq: Four times a day (QID) | ORAL | 0 refills | Status: AC | PRN
Start: 2020-12-05 — End: 2020-12-10

## 2020-12-05 MED ORDER — METOPROLOL TARTRATE 25 MG PO TABS: 12.5000 mg | ORAL_TABLET | Freq: Two times a day (BID) | ORAL | 0 refills | Status: DC

## 2020-12-05 MED ORDER — SODIUM BICARBONATE 650 MG PO TABS: 650.0000 mg | ORAL_TABLET | Freq: Two times a day (BID) | ORAL | 0 refills | Status: DC

## 2020-12-05 MED ORDER — NALOXONE HCL 4 MG/0.1ML NA LIQD: 1.0000 | Freq: Once | NASAL | 0 refills | Status: AC | PRN

## 2020-12-05 MED ORDER — ALBUTEROL SULFATE 2 MG PO TABS: 2.0000 mg | ORAL_TABLET | Freq: Every day | ORAL | 0 refills | Status: DC | PRN

## 2020-12-05 MED ORDER — ROSUVASTATIN CALCIUM 20 MG PO TABS: 20.0000 mg | ORAL_TABLET | Freq: Every day | ORAL | 0 refills | Status: DC

## 2020-12-05 MED ORDER — DICYCLOMINE HCL 10 MG PO CAPS
10.0000 mg | ORAL_CAPSULE | Freq: Four times a day (QID) | ORAL | 0 refills | Status: DC | PRN
Start: 2020-12-05 — End: 2021-07-30

## 2020-12-05 MED ORDER — INSULIN GLARGINE 100 UNIT/ML ~~LOC~~ SOLN: 18.0000 [IU] | Freq: Two times a day (BID) | SUBCUTANEOUS | 0 refills | Status: DC

## 2020-12-05 MED ORDER — TORSEMIDE 20 MG PO TABS: 20.0000 mg | ORAL_TABLET | Freq: Every day | ORAL | 0 refills | Status: DC

## 2020-12-05 MED ORDER — HYDROCODONE-ACETAMINOPHEN 10-325 MG PO TABS: 1.0000 | ORAL_TABLET | Freq: Three times a day (TID) | ORAL | 0 refills | Status: DC | PRN

## 2020-12-05 MED ORDER — AMITRIPTYLINE HCL 25 MG PO TABS
25.0000 mg | ORAL_TABLET | Freq: Every day | ORAL | 0 refills | Status: DC
Start: 2020-12-05 — End: 2021-07-30

## 2020-12-05 MED ORDER — AMIODARONE HCL 200 MG PO TABS: 200.0000 mg | ORAL_TABLET | ORAL | 0 refills | Status: DC

## 2020-12-05 NOTE — Progress Notes (Signed)
Location:  [facility] Room Number: 158-P Place of Service:  SNF (31)   CODE STATUS: Full Code  Allergies  Allergen Reactions  . Lactose Intolerance (Gi) Diarrhea    Chief Complaint  Patient presents with  . Hospitalization Follow-up    Follow-up from 3 day hospital stay 1/12-1/15/2022    HPI:  She is a 55 year old woman who has been hospitalized from 11-29-20 through 12-02-20. She has a medical history of afib; diabetes; polyneuropathy. She has one week of productive cough with clear sputum and congestion with shortness of breath prior to her hospitalization. She had increased abdominal girth due to fluid accumulation. She has not followed up with nephrology for her ckd. Her covid test was negative.  She was given IV lasix she had a cardiology consult. Her blood pressures are soft and unable to tolerate nitrates and hydralazine. She will need to avoid ace/arb due to her poor renal function. Her EF was 35-40%.  Her d-dimer was elevated at 4.75; she is on chronic xarelto for her afib. Her VQ scan was not definite PE findings. Her breathing improved with her diuresis; making PE less likely. she has declined a left heart cath and any ischemic workup.  She is here for short term rehab with her goal to return back home. Her spouse is her caregiver at home. She has chronic back pain with history of surgical intervention. She was taking vicodin 10/325 mg every 2 hours as needed at home. We have had a prolonged discussion regarding her use of level 2 narcs and respiratory failure. She has agreed to change her vicodon to every 6 hours as needed for one week then review. 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 Abnormal stress  test    a. 02/2017 MV: large region of fixed perfusion defect in basal to mid inf, mid-dist inflat walls, EF 43%. No ischemia (EF 55-60% by f/u echo).  . Arthritis   . Asthma   . Carotid arterial disease ([place])    a. 09/2017 Carotid U/S: 40-49% bilat ICA stenosis.  . Chronic back pain   . Coronary artery calcification seen on CT scan    a. 11/2017 CT Abd/Pelvis: Multi vessel coronary vascular Ca2+.  . Depression   . Diabetes mellitus   . Diabetic neuropathy ([place])   . Difficult intubation    DIFFICULT AIRWAY/FYI  . Family history of adverse reaction to anesthesia    mother had difficlty waking   . Femoral-popliteal bypass graft occlusion, left ([place]) 12/02/2017  . GERD (gastroesophageal reflux disease)   . History of echocardiogram    a. 03/2017 Echo: EF 55-60%, mild LVH, nl RV fxn.  . Hyperlipidemia   . Ischemic cardiomyopathy    a. 04/2018 Echo: EF 30-35%, Gr2 DD, mild LVH. Mild MR. Mildly dil LA, mod dil RV w/ mod red RV fxn, mild TR, PASP 58mHg.  . NSTEMI (non-ST elevated myocardial infarction) ([facility]    a. 05/2018 in setting of Afib, sepsis, and post-op L AKA. Peak trop 7.7. EF 30-35% by echo-->cath not performed 2/2 renal failure.  . Osteomyelitis of right fibula ([facility] 03/05/2017  . PAD (peripheral artery disease) ([facility]    a. S/p L fem-pop bypass; b. 11/2017 s/p Aortobifem bypass  2/2 graft occlusion; c. 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  . Paroxysmal atrial fibrillation with rapid ventricular response ([place]) 12/02/2017   a. CHA2DS2VASc = 3-->Xarelto; b. 05/2018 Recurrent Afib-->amio.  Marland Kitchen Renal insufficiency   . Ulcer    Foot    Past Surgical History:  Procedure Laterality Date  . ABDOMINAL AORTAGRAM  June 15, 2014  . ABDOMINAL AORTAGRAM N/A 06/15/2014   Procedure: ABDOMINAL Maxcine Ham;  Surgeon: Serafina Mitchell, MD;  Location: [facility] CATH LAB;  Service: Cardiovascular;  Laterality: N/A;  . ABDOMINAL AORTAGRAM N/A 11/22/2014   Procedure: ABDOMINAL  AORTAGRAM;  Surgeon: Serafina Mitchell, MD;  Location: [facility] CATH LAB;  Service: Cardiovascular;  Laterality: N/A;  . ABDOMINAL AORTOGRAM W/LOWER EXTREMITY N/A 01/07/2017   Procedure: Abdominal Aortogram w/Lower Extremity;  Surgeon: Serafina Mitchell, MD;  Location: [place] CV LAB;  Service: Cardiovascular;  Laterality: N/A;  . ABDOMINAL AORTOGRAM W/LOWER EXTREMITY N/A 10/31/2017   Procedure: ABDOMINAL AORTOGRAM W/LOWER EXTREMITY;  Surgeon: Elam Dutch, MD;  Location: [place] CV LAB;  Service: Cardiovascular;  Laterality: N/A;  . ABDOMINAL AORTOGRAM W/LOWER EXTREMITY N/A 03/24/2018   Procedure: ABDOMINAL AORTOGRAM W/LOWER EXTREMITY;  Surgeon: Serafina Mitchell, MD;  Location: [place] CV LAB;  Service: Cardiovascular;  Laterality: N/A;  . AMPUTATION Left 04/26/2018   Procedure: AMPUTATION ABOVE KNEE;  Surgeon: Elam Dutch, MD;  Location: [place];  Service: Vascular;  Laterality: Left;  . AORTA - BILATERAL FEMORAL ARTERY BYPASS GRAFT N/A 11/28/2017   Procedure: AORTA BIFEMORAL BYPASS USING HEMASHIELD GOLD GRAFT & REIMPLANT IMA;  Surgeon: Serafina Mitchell, MD;  Location: [facility] OR;  Service: Vascular;  Laterality: N/A;  . AORTIC ARCH ANGIOGRAPHY N/A 10/31/2017   Procedure: AORTIC ARCH ANGIOGRAPHY;  Surgeon: Elam Dutch, MD;  Location: [place] CV LAB;  Service: Cardiovascular;  Laterality: N/A;  . APPLICATION OF WOUND VAC  11/28/2017   Procedure: APPLICATION OF WOUND VAC;  Surgeon: Serafina Mitchell, MD;  Location: [facility] OR;  Service: Vascular;;  . APPLICATION OF WOUND VAC Left 03/27/2018   Procedure: APPLICATION OF WOUND VAC LEFT GROIN USING PREVENA PLUS;  Surgeon: Serafina Mitchell, MD;  Location: [name];  Service: Vascular;  Laterality: Left;  . ARTERIAL BYPASS SURGERY   07/05/2010   Right Common Femoral to below knee popliteal BPG  . BACK SURGERY     X's  2  . CARDIAC CATHETERIZATION    . CHOLECYSTECTOMY     Gall Bladder  . CYSTECTOMY Left    wrist  . EMBOLECTOMY Left 11/28/2017    Procedure: Left Lower Extremity Embolectomy, Left Tibial Peroneal Trunk Endarterectomy with Patch Angioplasty; Vein Harvest Small Saphenous Graft Left Lower Leg;  Surgeon: [name], MD;  Location: [place];  Service: Vascular;  Laterality: Left;  . EYE SURGERY Left 02/23/2020   Dr. Zadie Rhine  . FEMORAL-POPLITEAL BYPASS GRAFT Left 03/27/2018   Procedure: THROMBECTOMY OF LEFT FEMORAL TIBIAL BYPASS;  Surgeon: Serafina Mitchell, MD;  Location: [place];  Service: Vascular;  Laterality: Left;  . FEMORAL-TIBIAL BYPASS GRAFT Left 03/27/2018   Procedure: BYPASS GRAFT FEMORAL-TIBIAL ARTERY LEFT REDO USING CRYOPRESERVED SAPHENOUS VEIN 70cm;  Surgeon: Serafina Mitchell, MD;  [facility] OR;  Service: Vascular;  Laterality: Left;  . INTERCOSTAL NERVE BLOCK  November 2015  . INTRAOPERATIVE ARTERIOGRAM  11/28/2017   Procedure: INTRA OPERATIVE ARTERIOGRAM OF LEFT LEG;  Surgeon: Serafina Mitchell, MD;  Location: [place];  Service: Vascular;;  . INTRAOPERATIVE  ARTERIOGRAM Left 03/27/2018   Procedure: INTRA OPERATIVE ARTERIOGRAM TIMES TWO;  Surgeon: Serafina Mitchell, MD;  Location: [place];  Service: Vascular;  Laterality: Left;  . IR FLUORO GUIDE CV LINE RIGHT  03/20/2017  . IR FLUORO GUIDE CV LINE RIGHT  05/05/2018  . IR REMOVAL TUN CV CATH W/O FL  05/20/2018  . IR US GUIDE VASC ACCESS RIGHT  03/20/2017  . IR US GUIDE VASC ACCESS RIGHT  05/05/2018  . IRRIGATION AND DEBRIDEMENT BUTTOCKS Right 09/30/2016   Procedure: DEBRIDEMENT RIGHT  BUTTOCK WOUND;  Surgeon: Georganna Skeans, MD;  Location: [place];  Service: General;  Laterality: Right;  . left foot surgery    . PERIPHERAL VASCULAR CATHETERIZATION N/A 05/07/2016   Procedure: Abdominal Aortogram;  Surgeon: Serafina Mitchell, MD;  Location: [place] CV LAB;  Service: Cardiovascular;  Laterality: N/A;  . PERIPHERAL VASCULAR CATHETERIZATION N/A 05/07/2016   Procedure: Lower Extremity Angiography;  Surgeon: Serafina Mitchell, MD;  Location: [place] CV LAB;  Service:  Cardiovascular;  Laterality: N/A;  . PERIPHERAL VASCULAR CATHETERIZATION N/A 05/07/2016   Procedure: Aortic Arch Angiography;  Surgeon: Serafina Mitchell, MD;  Location: [place] CV LAB;  Service: Cardiovascular;  Laterality: N/A;  . PERIPHERAL VASCULAR CATHETERIZATION N/A 05/07/2016   Procedure: Upper Extremity Angiography;  Surgeon: Serafina Mitchell, MD;  Location: [place] CV LAB;  Service: Cardiovascular;  Laterality: N/A;  . PERIPHERAL VASCULAR CATHETERIZATION Right 05/07/2016   Procedure: Peripheral Vascular Balloon Angioplasty;  Surgeon: Serafina Mitchell, MD;  Location: [place] CV LAB;  Service: Cardiovascular;  Laterality: Right;  subclavian  . PERIPHERAL VASCULAR CATHETERIZATION Right 05/07/2016   Procedure: Peripheral Vascular Intervention;  Surgeon: Serafina Mitchell, MD;  Location: [place] CV LAB;  Service: Cardiovascular;  Laterality: Right;  External  Iliac  . SKIN GRAFT Right 2012   [name] by Dr. Nils Pyle- Right and Left Ankle  . SPINE SURGERY    . THROMBECTOMY FEMORAL ARTERY  11/28/2017   Procedure: THROMBECTOMY  & REVISION OF BILATERAL FEMORAL TO POPLETEAL ARTERIES;  Surgeon: Serafina Mitchell, MD;  Location: [place];  Service: Vascular;;  . TONSILLECTOMY    . TRANSMETATARSAL AMPUTATION Right 10/07/2020   Procedure: RIGHT BELOW KNEE AMPUTATION;  Surgeon: Erle Crocker, MD;  Location: [place];  Service: Orthopedics;  Laterality: Right;    Social History   Socioeconomic History  . Marital status: Married    Spouse name: Not on file  . Number of children: Not on file  . Years of education: Not on file  . Highest education level: Not on file  Occupational History  . Occupation: disabled  Tobacco Use  . Smoking status: Current Some Day Smoker    Packs/day: 1.00    Years: 30.00    Pack years: 30.00    Types: Cigarettes    Last attempt to quit: 02/07/2017    Years since quitting: 3.8  . Smokeless tobacco: Never Used  Vaping Use  . Vaping Use: Never used  Substance  and Sexual Activity  . Alcohol use: No    Alcohol/week: 0.0 standard drinks  . Drug use: No  . Sexual activity: Yes    Birth control/protection: Post-menopausal  Other Topics Concern  . Not on file  Social History Narrative  . Not on file   Social Determinants of Health   Financial Resource Strain: Not on file  Food Insecurity: Not on file  Transportation Needs: Not on file  Physical Activity: Not on file  Stress: Not  on file  Social Connections: Not on file  Intimate Partner Violence: Not on file   Family History  Problem Relation Age of Onset  . Coronary artery disease Mother   . Peripheral vascular disease Mother   . Heart disease Mother        Before age 68  . Other Mother        Venous insuffiency  . Diabetes Mother   . Hyperlipidemia Mother   . Hypertension Mother   . Varicose Veins Mother   . Heart attack Mother        before age 8  . Heart disease Father   . Diabetes Father   . Diabetes Maternal Grandmother   . Diabetes Paternal Grandmother   . Diabetes Paternal Grandfather   . Diabetes Sister   . Hypertension Sister   . Diabetes Brother   . Hypertension Brother       VITAL SIGNS BP 132/78   Pulse 61   Temp 97.6 F (36.4 C)   Resp 20   Ht '5\' 7"'  (1.702 m)   Wt 223 lb (101.2 kg)   SpO2 96%   BMI 34.93 kg/m   Outpatient Encounter Medications as of 12/05/2020  Medication Sig  . albuterol (PROVENTIL) 2 MG tablet Take 2 mg by mouth daily as needed for shortness of breath.   Marland Kitchen amiodarone (PACERONE) 200 MG tablet Take 1 tablet (200 mg total) by mouth See admin instructions. oral amiodarone 458m bid x 7 days, then 2061mbid x 2 weeks, then 20025maily after that  . amitriptyline (ELAVIL) 25 MG tablet Take 1 tablet (25 mg total) by mouth at bedtime.  . BRoseanne Kaufmanru-Castor Oil (VENELEX) OINT Apply 1 application topically 3 (three) times daily. Every shift to bilateral buttocks, coccyx, and sacrum  . dicyclomine (BENTYL) 10 MG capsule Take 10 mg by mouth  every 6 (six) hours as needed for spasms.  . HMarland KitchenDROcodone-acetaminophen (NORCO) 10-325 MG tablet Take 1 tablet by mouth every 8 (eight) hours as needed for moderate pain (pain).  . insulin glargine (LANTUS) 100 UNIT/ML injection Inject 0.18 mLs (18 Units total) into the skin 2 (two) times daily.  . insulin lispro (HUMALOG KWIKPEN) 100 UNIT/ML KwikPen Inject into the skin See admin instructions. If blood sugar 121-150, 2 units If blood sugar 151-200, 4 units If blood sugar 201-250, 6 units If blood sugar 251-300, 8 units If blood sugar 301-500, 10 units If blood sugar greater than 350 call MD  . LYRICA 50 MG capsule Take 1 capsule (50 mg total) by mouth 2 (two) times daily.  . metoprolol tartrate (LOPRESSOR) 25 MG tablet Take 0.5 tablets (12.5 mg total) by mouth 2 (two) times daily.  . naloxone (NARCAN) 4 MG/0.1ML LIQD nasal spray kit Place 1 spray into the nose once as needed (opioid overdose).  . nicotine (NICODERM CQ - DOSED IN MG/24 HOURS) 21 mg/24hr patch Place 21 mg onto the skin daily. Rotate sites and remove old patch prior to application  . omeprazole (PRILOSEC) 40 MG capsule Take 40 mg by mouth daily.  . Potassium Chloride ER 20 MEQ TBCR Take 20 mEq by mouth daily. 1 tab daily by mouth  . Rivaroxaban (XARELTO) 15 MG TABS tablet Take 1 tablet (15 mg total) by mouth daily with supper.  . rosuvastatin (CRESTOR) 20 MG tablet Take 1 tablet (20 mg total) by mouth daily at 6 PM.  . sodium bicarbonate 650 MG tablet Take 1 tablet (650 mg total) by mouth 2 (two) times  daily.  . torsemide (DEMADEX) 20 MG tablet Take 20 mg by mouth daily.  Marland Kitchen UNABLE TO FIND Diet: Regular, NAS, Consistent Carbohydrate  . vitamin B-12 1000 MCG tablet Take 1 tablet (1,000 mcg total) by mouth daily.   No facility-administered encounter medications on file as of 12/05/2020.     SIGNIFICANT DIAGNOSTIC EXAMS  TODAY  11-29-20: chest x-ray: Cardiomegaly with mild vascular congestion. No focal consolidation.    11-30-20: 2-d echo:  1. Global hypokinesis with more prominent anteroseptal and apical  hypokinesis. . Left ventricular ejection fraction, by estimation, is 35 to  40%. The left ventricle has moderately decreased function. The left  ventricle demonstrates regional wall motion  abnormalities (see scoring diagram/findings for description). Left  ventricular diastolic parameters are indeterminate.   2. Right ventricular systolic function is normal. The right ventricular  size is normal.   3. Left atrial size was severely dilated.   4. The pericardial effusion is circumferential.   5. The mitral valve is normal in structure. No evidence of mitral valve  regurgitation. No evidence of mitral stenosis.   6. The aortic valve has an indeterminant number of cusps. Aortic valve  regurgitation is not visualized. No aortic stenosis is present.   7. The inferior vena cava is normal in size with greater than 50%  respiratory variability, suggesting right atrial pressure of 3 mmHg.   11-30-20: V/Q right upper lobe as well as scattered areas of decreased radiotracer uptake elsewhere. Pulmonary embolism cannot be excluded in this circumstance. This circumstance is felt to warrant consideration for CT angiogram chest to further evaluate. If there is a contraindication to contrast administration, it may be reasonable to consider lower extremity venous duplex ultrasound to assess for possible lower extremity deep venous thrombosis.  11-30-20: bilateral lower extremity venous doppler: No evidence of deep venous thrombosis in either lower extremity. Examination limited by tissue edema  LABS REVIEWED TODAY;   11-29-20: wbc 3.6; hgb 9.5; hct 32.7; mcv 89.8 plt 237; glucose 79; bun 31; creat 2.10; k+ 3.5; na++ 137; ca 7.8; liver normal albumin 2.7 GFR 27; d-dimer: 4.75; CRP 2.6; BNP 2626.0; hgb a1c 5.7; blood culture: no growth 12-01-20: wbc 7.6; hgb 9.6; hct 33.4; mcv 90.5 plt 228; glucose 124; bun 34; creat 2.14; k+ 3.2;  na++ 137; ca 7.9 phos 5.6; albumin 2.8; GFR 27; mag 1.3; urine culture; <10,000 12-02-20: wbc 4.7; hgb 8.6; hct 29.8 mc 90.0 plt 194; glucose 142; bun 37; creat 2.35; k+ 3.4; na++ 135; ca 7.4 mag 2.0; GFR 24   Review of Systems  Constitutional: Negative for malaise/fatigue.  Respiratory: Negative for cough and shortness of breath.   Cardiovascular: Negative for chest pain, palpitations and leg swelling.  Gastrointestinal: Negative for abdominal pain, constipation and heartburn.  Musculoskeletal: Positive for back pain. Negative for joint pain and myalgias.  Skin: Negative.   Neurological: Negative for dizziness.       Tingling in stumps   Psychiatric/Behavioral: The patient is not nervous/anxious.     Physical Exam Constitutional:      General: She is not in acute distress.    Appearance: She is well-developed and well-nourished. She is obese. She is not diaphoretic.  Neck:     Thyroid: No thyromegaly.  Cardiovascular:     Rate and Rhythm: Normal rate. Rhythm irregular.     Pulses: Intact distal pulses.     Heart sounds: Normal heart sounds.     Comments: History of multiple femoral bypass  Pulmonary:  Effort: Pulmonary effort is normal. No respiratory distress.     Breath sounds: Wheezing and rhonchi present.  Abdominal:     General: Bowel sounds are normal. There is no distension.     Palpations: Abdomen is soft.     Tenderness: There is no abdominal tenderness.  Musculoskeletal:        General: No edema.     Cervical back: Neck supple.     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  Skin:    General: Skin is warm and dry.  Neurological:     Mental Status: She is alert and oriented to person, place, and time.  Psychiatric:        Mood and Affect: Mood and affect and mood normal.      ASSESSMENT/ PLAN:  TODAY  1. Acute on chronic combined  systolic and diastolic heart failure: is without change: EF 35-40% ( 11-30-20) will continue demadex 20 mg daily lopressor 12.5 mg twice daily and k+ 20 meq daily   2. 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 Paroxysmal atrial fibrillation with rapid ventricular response: heart rate is stable is taking lopressor 12.5 mg for rate control and is on amiodarone taper to 200 mg daily for rate control will continue xarelto 15 mg daily   5.  Gastroesophageal reflux disease without esophagitis: is stable will continue prilosec 40 mg daily has bentyl 10 mg every 6 hours as needed for spastic colon.   6.  CKD stage 4 due to type 2 diabetes mellitus: is stable bun 37; creat 2.35 will monitor   7. Hyperlipidemia due to type 2 diabetes mellitus: is stable will continue crestor 20 mg daily   8. Controlled type 2 diabetes mellitus with stage 4 chronic kidney disease with long term use of insulin: is stable hgb a1c 5.7; will continue lantus 18 units twice daily and humalog SSI  9. Diabetic peripheral neuropathy: is stable will continue elavil 25 mg daily and lyrica 50 mg twice daily   10. DDD lumbosacral/cervical/chronic bilateral back pain without sciatica : is stable will continue lyrica 50 mg twice daily I have had a very prolonged discussion with regarding her use of level 2 narcs and that she is very high risk of respiratory failure and death. She has verbalized understanding and is willing to try vicodin 10/325 mg every 6 hours as needed for one week.   11. Anemia due to stage 4 chronic kidney disease: is stable hgb 8.6 will continue sodium bicarb 650 mg twice daily   12. Chronic respiratory failure with hypoxia: is stable will continue  albuterol 2 mg daily las needed   13. Hypoalbuminemia due to protein calorie malnutrition: albumin is 2.8 will use supplements as directed  14. Current smoker: will continue nicotine patch 21 mg daily      MD is aware of resident's narcotic use and is in agreement with current plan of care. We will attempt to wean resident as appropriate.  Ok Edwards NP [facility]  Contact [phone] Monday through Friday 8am- 5pm  After hours  call [phone]    ADDENDUM: SHE IS GOING HOME AMA: WILL SENT HER PRESCRIPTION MEDICATIONS TO [facility] IN [place]; NO DME NEEDED. WILL SET UP HOME HEALTH FOR PT/OT

## 2021-03-08 ENCOUNTER — Encounter (HOSPITAL_COMMUNITY): Payer: Self-pay | Admitting: Emergency Medicine

## 2021-03-08 ENCOUNTER — Inpatient Hospital Stay (HOSPITAL_COMMUNITY)
Admission: EM | Admit: 2021-03-08 | Discharge: 2021-03-16 | DRG: 640 | Disposition: A | Discharge status: Home / Self Care | Payer: Medicare Other | Attending: Internal Medicine | Admitting: Internal Medicine

## 2021-03-08 ENCOUNTER — Other Ambulatory Visit: Payer: Self-pay

## 2021-03-08 DIAGNOSIS — R1032 Left lower quadrant pain: Secondary | ICD-10-CM

## 2021-03-08 DIAGNOSIS — E878 Other disorders of electrolyte and fluid balance, not elsewhere classified: Secondary | ICD-10-CM | POA: Diagnosis present

## 2021-03-08 DIAGNOSIS — R197 Diarrhea, unspecified: Secondary | ICD-10-CM

## 2021-03-08 DIAGNOSIS — E8809 Other disorders of plasma-protein metabolism, not elsewhere classified: Secondary | ICD-10-CM | POA: Diagnosis present

## 2021-03-08 DIAGNOSIS — N184 Chronic kidney disease, stage 4 (severe): Secondary | ICD-10-CM | POA: Diagnosis present

## 2021-03-08 DIAGNOSIS — F17210 Nicotine dependence, cigarettes, uncomplicated: Secondary | ICD-10-CM | POA: Diagnosis present

## 2021-03-08 DIAGNOSIS — E1151 Type 2 diabetes mellitus with diabetic peripheral angiopathy without gangrene: Secondary | ICD-10-CM | POA: Diagnosis present

## 2021-03-08 DIAGNOSIS — N179 Acute kidney failure, unspecified: Secondary | ICD-10-CM | POA: Diagnosis present

## 2021-03-08 DIAGNOSIS — I739 Peripheral vascular disease, unspecified: Secondary | ICD-10-CM | POA: Diagnosis present

## 2021-03-08 DIAGNOSIS — L89313 Pressure ulcer of right buttock, stage 3: Secondary | ICD-10-CM | POA: Diagnosis present

## 2021-03-08 DIAGNOSIS — R9431 Abnormal electrocardiogram [ECG] [EKG]: Secondary | ICD-10-CM

## 2021-03-08 DIAGNOSIS — L89213 Pressure ulcer of right hip, stage 3: Secondary | ICD-10-CM | POA: Diagnosis present

## 2021-03-08 DIAGNOSIS — E739 Lactose intolerance, unspecified: Secondary | ICD-10-CM | POA: Diagnosis present

## 2021-03-08 DIAGNOSIS — K635 Polyp of colon: Secondary | ICD-10-CM | POA: Diagnosis present

## 2021-03-08 DIAGNOSIS — K219 Gastro-esophageal reflux disease without esophagitis: Secondary | ICD-10-CM | POA: Diagnosis present

## 2021-03-08 DIAGNOSIS — Z833 Family history of diabetes mellitus: Secondary | ICD-10-CM

## 2021-03-08 DIAGNOSIS — I130 Hypertensive heart and chronic kidney disease with heart failure and stage 1 through stage 4 chronic kidney disease, or unspecified chronic kidney disease: Secondary | ICD-10-CM | POA: Diagnosis present

## 2021-03-08 DIAGNOSIS — I2510 Atherosclerotic heart disease of native coronary artery without angina pectoris: Secondary | ICD-10-CM | POA: Diagnosis present

## 2021-03-08 DIAGNOSIS — E785 Hyperlipidemia, unspecified: Secondary | ICD-10-CM | POA: Diagnosis present

## 2021-03-08 DIAGNOSIS — Z79899 Other long term (current) drug therapy: Secondary | ICD-10-CM

## 2021-03-08 DIAGNOSIS — E1122 Type 2 diabetes mellitus with diabetic chronic kidney disease: Secondary | ICD-10-CM | POA: Diagnosis present

## 2021-03-08 DIAGNOSIS — I255 Ischemic cardiomyopathy: Secondary | ICD-10-CM | POA: Diagnosis present

## 2021-03-08 DIAGNOSIS — Z794 Long term (current) use of insulin: Secondary | ICD-10-CM

## 2021-03-08 DIAGNOSIS — E11649 Type 2 diabetes mellitus with hypoglycemia without coma: Secondary | ICD-10-CM | POA: Diagnosis not present

## 2021-03-08 DIAGNOSIS — Z89612 Acquired absence of left leg above knee: Secondary | ICD-10-CM

## 2021-03-08 DIAGNOSIS — R6881 Early satiety: Secondary | ICD-10-CM | POA: Diagnosis present

## 2021-03-08 DIAGNOSIS — E669 Obesity, unspecified: Secondary | ICD-10-CM | POA: Diagnosis present

## 2021-03-08 DIAGNOSIS — Z8249 Family history of ischemic heart disease and other diseases of the circulatory system: Secondary | ICD-10-CM

## 2021-03-08 DIAGNOSIS — I252 Old myocardial infarction: Secondary | ICD-10-CM

## 2021-03-08 DIAGNOSIS — Z89611 Acquired absence of right leg above knee: Secondary | ICD-10-CM

## 2021-03-08 DIAGNOSIS — J45909 Unspecified asthma, uncomplicated: Secondary | ICD-10-CM | POA: Diagnosis present

## 2021-03-08 DIAGNOSIS — K52839 Microscopic colitis, unspecified: Secondary | ICD-10-CM | POA: Diagnosis present

## 2021-03-08 DIAGNOSIS — E876 Hypokalemia: Secondary | ICD-10-CM | POA: Diagnosis not present

## 2021-03-08 DIAGNOSIS — Z6834 Body mass index (BMI) 34.0-34.9, adult: Secondary | ICD-10-CM

## 2021-03-08 DIAGNOSIS — Z7901 Long term (current) use of anticoagulants: Secondary | ICD-10-CM

## 2021-03-08 DIAGNOSIS — G2581 Restless legs syndrome: Secondary | ICD-10-CM | POA: Diagnosis not present

## 2021-03-08 DIAGNOSIS — R1110 Vomiting, unspecified: Secondary | ICD-10-CM

## 2021-03-08 DIAGNOSIS — I5042 Chronic combined systolic (congestive) and diastolic (congestive) heart failure: Secondary | ICD-10-CM | POA: Diagnosis present

## 2021-03-08 DIAGNOSIS — Z20822 Contact with and (suspected) exposure to covid-19: Secondary | ICD-10-CM | POA: Diagnosis present

## 2021-03-08 DIAGNOSIS — E1140 Type 2 diabetes mellitus with diabetic neuropathy, unspecified: Secondary | ICD-10-CM | POA: Diagnosis present

## 2021-03-08 DIAGNOSIS — I480 Paroxysmal atrial fibrillation: Secondary | ICD-10-CM | POA: Diagnosis present

## 2021-03-08 LAB — HEPATIC FUNCTION PANEL
ALT: 7 U/L (ref 0–44)
AST: 12 U/L — ABNORMAL LOW (ref 15–41)
Albumin: 2.7 g/dL — ABNORMAL LOW (ref 3.5–5.0)
Alkaline Phosphatase: 95 U/L (ref 38–126)
Bilirubin, Direct: 0.2 mg/dL (ref 0.0–0.2)
Indirect Bilirubin: 0.7 mg/dL (ref 0.3–0.9)
Total Bilirubin: 0.9 mg/dL (ref 0.3–1.2)
Total Protein: 6.5 g/dL (ref 6.5–8.1)

## 2021-03-08 LAB — CBC WITH DIFFERENTIAL/PLATELET
Abs Immature Granulocytes: 0.02 10*3/uL (ref 0.00–0.07)
Basophils Absolute: 0.1 10*3/uL (ref 0.0–0.1)
Basophils Relative: 1 %
Eosinophils Absolute: 0.2 10*3/uL (ref 0.0–0.5)
Eosinophils Relative: 3 %
HCT: 37.7 % (ref 36.0–46.0)
Hemoglobin: 11.6 g/dL — ABNORMAL LOW (ref 12.0–15.0)
Immature Granulocytes: 0 %
Lymphocytes Relative: 12 %
Lymphs Abs: 0.8 10*3/uL (ref 0.7–4.0)
MCH: 25.4 pg — ABNORMAL LOW (ref 26.0–34.0)
MCHC: 30.8 g/dL (ref 30.0–36.0)
MCV: 82.5 fL (ref 80.0–100.0)
Monocytes Absolute: 0.4 10*3/uL (ref 0.1–1.0)
Monocytes Relative: 6 %
Neutro Abs: 5.1 10*3/uL (ref 1.7–7.7)
Neutrophils Relative %: 78 %
Platelets: 120 10*3/uL — ABNORMAL LOW (ref 150–400)
RBC: 4.57 MIL/uL (ref 3.87–5.11)
RDW: 18.8 % — ABNORMAL HIGH (ref 11.5–15.5)
WBC: 6.6 10*3/uL (ref 4.0–10.5)
nRBC: 0 % (ref 0.0–0.2)

## 2021-03-08 LAB — BASIC METABOLIC PANEL
Anion gap: 11 (ref 5–15)
BUN: 7 mg/dL (ref 6–20)
CO2: 28 mmol/L (ref 22–32)
Calcium: 5.5 mg/dL — CL (ref 8.9–10.3)
Chloride: 99 mmol/L (ref 98–111)
Creatinine, Ser: 1.21 mg/dL — ABNORMAL HIGH (ref 0.44–1.00)
GFR, Estimated: 53 mL/min — ABNORMAL LOW (ref >60)
Glucose, Bld: 83 mg/dL (ref 70–99)
Potassium: 2.9 mmol/L — ABNORMAL LOW (ref 3.5–5.1)
Sodium: 138 mmol/L (ref 135–145)

## 2021-03-08 LAB — C DIFFICILE QUICK SCREEN W PCR REFLEX
C Diff antigen: NEGATIVE
C Diff interpretation: NOT DETECTED
C Diff toxin: NEGATIVE

## 2021-03-08 LAB — SARS CORONAVIRUS 2 (TAT 6-24 HRS): SARS Coronavirus 2: NEGATIVE

## 2021-03-08 LAB — MAGNESIUM: Magnesium: 0.8 mg/dL — CL (ref 1.7–2.4)

## 2021-03-08 LAB — ETHANOL: Alcohol, Ethyl (B): <10 mg/dL (ref <10)

## 2021-03-08 MED ORDER — METOPROLOL TARTRATE 25 MG PO TABS
12.5000 mg | ORAL_TABLET | Freq: Two times a day (BID) | ORAL | Status: DC
  Administered 2021-03-08 – 2021-03-09 (×2): 12.5 mg via ORAL
  Filled 2021-03-08 (×2): qty 1

## 2021-03-08 MED ORDER — PROMETHAZINE HCL 12.5 MG PO TABS
12.5000 mg | ORAL_TABLET | Freq: Four times a day (QID) | ORAL | Status: DC | PRN
  Administered 2021-03-10 (×2): 12.5 mg via ORAL
  Filled 2021-03-08 (×2): qty 1

## 2021-03-08 MED ORDER — MAGNESIUM SULFATE 2 GM/50ML IV SOLN
2.0000 g | Freq: Once | INTRAVENOUS | Status: AC
  Administered 2021-03-08: 2 g via INTRAVENOUS
  Filled 2021-03-08: qty 50

## 2021-03-08 MED ORDER — AMIODARONE HCL 200 MG PO TABS
200.0000 mg | ORAL_TABLET | Freq: Every day | ORAL | Status: DC
  Administered 2021-03-09 – 2021-03-16 (×9): 200 mg via ORAL
  Filled 2021-03-08 (×10): qty 1

## 2021-03-08 MED ORDER — PANTOPRAZOLE SODIUM 40 MG PO TBEC
40.0000 mg | DELAYED_RELEASE_TABLET | Freq: Every day | ORAL | Status: DC
  Administered 2021-03-09 – 2021-03-12 (×4): 40 mg via ORAL
  Filled 2021-03-08 (×4): qty 1

## 2021-03-08 MED ORDER — RIVAROXABAN 15 MG PO TABS
15.0000 mg | ORAL_TABLET | Freq: Every day | ORAL | Status: DC
  Administered 2021-03-09: 15 mg via ORAL
  Filled 2021-03-08: qty 1

## 2021-03-08 MED ORDER — ACETAMINOPHEN 650 MG RE SUPP: 650.0000 mg | Freq: Four times a day (QID) | RECTAL | Status: DC | PRN

## 2021-03-08 MED ORDER — SODIUM CHLORIDE 0.9 % IV SOLN: INTRAVENOUS | Status: DC

## 2021-03-08 MED ORDER — POTASSIUM CHLORIDE 10 MEQ/100ML IV SOLN
10.0000 meq | INTRAVENOUS | Status: AC
  Administered 2021-03-08 (×4): 10 meq via INTRAVENOUS
  Filled 2021-03-08 (×4): qty 100

## 2021-03-08 MED ORDER — SODIUM BICARBONATE 650 MG PO TABS
650.0000 mg | ORAL_TABLET | Freq: Two times a day (BID) | ORAL | Status: DC
  Administered 2021-03-08 – 2021-03-16 (×16): 650 mg via ORAL
  Filled 2021-03-08 (×19): qty 1

## 2021-03-08 MED ORDER — ACETAMINOPHEN 325 MG PO TABS
650.0000 mg | ORAL_TABLET | Freq: Four times a day (QID) | ORAL | Status: DC | PRN
  Administered 2021-03-08 – 2021-03-16 (×13): 650 mg via ORAL
  Filled 2021-03-08 (×13): qty 2

## 2021-03-08 MED ORDER — ROSUVASTATIN CALCIUM 20 MG PO TABS
20.0000 mg | ORAL_TABLET | Freq: Every day | ORAL | Status: DC
  Administered 2021-03-09 – 2021-03-15 (×8): 20 mg via ORAL
  Filled 2021-03-08 (×8): qty 1

## 2021-03-08 MED ORDER — CALCIUM GLUCONATE-NACL 1-0.675 GM/50ML-% IV SOLN
1.0000 g | Freq: Once | INTRAVENOUS | Status: AC
  Administered 2021-03-08: 1000 mg via INTRAVENOUS
  Filled 2021-03-08: qty 50

## 2021-03-08 MED ORDER — POLYETHYLENE GLYCOL 3350 17 G PO PACK: 17.0000 g | PACK | Freq: Every day | ORAL | Status: DC | PRN

## 2021-03-08 MED ORDER — POTASSIUM CHLORIDE CRYS ER 20 MEQ PO TBCR
40.0000 meq | EXTENDED_RELEASE_TABLET | Freq: Once | ORAL | Status: AC
  Administered 2021-03-08: 40 meq via ORAL
  Filled 2021-03-08: qty 2

## 2021-03-08 NOTE — H&P (Signed)
History and Physical    CASSY SPROWL NTZ:[id number] DOB: 1966-07-07 DOA: 03/08/2021  PCP: Frazier Richards, MD   Patient coming from: Home  I have personally briefly reviewed patient's old medical records in [place]  Chief Complaint: abnormal labs  HPI: [name] is a 55 y.o. female with medical history significant for atrial fibrillation, systolic and diastolic CHF, diabetes mellitus, hypertension, bilateral above-knee amputation, CKD 4, NSTEMI. Patient was sent to the ED by her primary care provider (clinic in [place]) reports of low potassium.  Patient has had diarrhea over the past 4 -5 weeks.  She reports 2-3 watery bowel movements daily.  She reports intermittent vomiting also.  She reports poor oral intake over the past few weeks-taking only broth and crackers.  No abdominal pain.  She denies generalized weakness of body cramps.  She takes torsemide very infrequently if she is having swelling.  She has not taken torsemide in at least 2 weeks.  She drinks alcohol only on social occasions like Christmas and July 4. She has not had any stool studies done, as she is in the process of being referred to a gastroenterologist for colonoscopy.  ED Course: Blood pressure systolic 675F to 163W.  Potassium 2.9.  Calcium 5.5.  Magnesium 0.8.  EKG shows prolonged QTC of 536.  Electrolyte supplementation started in the ED.  Hospitalist to admit.  Review of Systems: As per HPI all other systems reviewed and negative.  Past Medical History:  Diagnosis Date  . Abnormal stress test    a. 02/2017 MV: large region of fixed perfusion defect in basal to mid inf, mid-dist inflat walls, EF 43%. No ischemia (EF 55-60% by f/u echo).  . Arthritis   . Asthma   . Carotid arterial disease ([place])    a. 09/2017 Carotid U/S: 40-49% bilat ICA stenosis.  . Chronic back pain   . Coronary artery calcification seen on CT scan    a. 11/2017 CT Abd/Pelvis: Multi vessel coronary vascular Ca2+.  .  Depression   . Diabetes mellitus   . Diabetic neuropathy ([place])   . Difficult intubation    DIFFICULT AIRWAY/FYI  . Family history of adverse reaction to anesthesia    mother had difficlty waking   . Femoral-popliteal bypass graft occlusion, left ([place]) 12/02/2017  . GERD (gastroesophageal reflux disease)   . History of echocardiogram    a. 03/2017 Echo: EF 55-60%, mild LVH, nl RV fxn.  . Hyperlipidemia   . Ischemic cardiomyopathy    a. 04/2018 Echo: EF 30-35%, Gr2 DD, mild LVH. Mild MR. Mildly dil LA, mod dil RV w/ mod red RV fxn, mild TR, PASP 8mmHg.  . NSTEMI (non-ST elevated myocardial infarction) ([place])    a. 05/2018 in setting of Afib, sepsis, and post-op L AKA. Peak trop 7.7. EF 30-35% by echo-->cath not performed 2/2 renal failure.  . 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 PAD (peripheral artery disease) ([place])    a. S/p L fem-pop bypass; b. 11/2017 s/p Aortobifem bypass 2/2 graft occlusion; c. 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  . Paroxysmal atrial fibrillation with rapid ventricular response ([place]) 12/02/2017   a. CHA2DS2VASc = 3-->Xarelto; b. 05/2018 Recurrent Afib-->amio.  Marland Kitchen Renal insufficiency   . Ulcer    Foot    Past Surgical History:  Procedure Laterality Date  . ABDOMINAL AORTAGRAM  June 15, 2014  . ABDOMINAL AORTAGRAM N/A 06/15/2014   Procedure: ABDOMINAL Maxcine Ham;  Surgeon: Durene Fruits  Pierre Bali, MD;  Location: [facility];  Service: Cardiovascular;  Laterality: N/A;  . ABDOMINAL AORTAGRAM N/A 11/22/2014   Procedure: ABDOMINAL AORTAGRAM;  Surgeon: Serafina Mitchell, MD;  Location: [facility];  Service: Cardiovascular;  Laterality: N/A;  . ABDOMINAL AORTOGRAM W/LOWER EXTREMITY N/A 01/07/2017   Procedure: Abdominal Aortogram w/Lower Extremity;  Surgeon: Serafina Mitchell, MD;  Location: [facility] CV LAB;  Service: Cardiovascular;  Laterality: N/A;  . ABDOMINAL AORTOGRAM W/LOWER EXTREMITY N/A 10/31/2017   Procedure: ABDOMINAL AORTOGRAM  W/LOWER EXTREMITY;  Surgeon: Elam Dutch, MD;  Location: [facility] CV LAB;  Service: Cardiovascular;  Laterality: N/A;  . ABDOMINAL AORTOGRAM W/LOWER EXTREMITY N/A 03/24/2018   Procedure: ABDOMINAL AORTOGRAM W/LOWER EXTREMITY;  Surgeon: Serafina Mitchell, MD;  Location: [facility] CV LAB;  Service: Cardiovascular;  Laterality: N/A;  . AMPUTATION Left 04/26/2018   Procedure: AMPUTATION ABOVE KNEE;  Surgeon: Elam Dutch, MD;  Location: [facility];  Service: Vascular;  Laterality: Left;  . AORTA - BILATERAL FEMORAL ARTERY BYPASS GRAFT N/A 11/28/2017   Procedure: AORTA BIFEMORAL BYPASS USING HEMASHIELD GOLD GRAFT & REIMPLANT IMA;  Surgeon: Serafina Mitchell, MD;  Location: [facility];  Service: Vascular;  Laterality: N/A;  . AORTIC ARCH ANGIOGRAPHY N/A 10/31/2017   Procedure: AORTIC ARCH ANGIOGRAPHY;  Surgeon: Elam Dutch, MD;  Location: [facility] CV LAB;  Service: Cardiovascular;  Laterality: N/A;  . APPLICATION OF WOUND VAC  11/28/2017   Procedure: APPLICATION OF WOUND VAC;  Surgeon: Serafina Mitchell, MD;  Location: [facility] OR;  Service: Vascular;;  . APPLICATION OF WOUND VAC Left 03/27/2018   Procedure: APPLICATION OF WOUND VAC LEFT GROIN USING PREVENA PLUS;  Surgeon: Serafina Mitchell, MD;  Location: [facility];  Service: Vascular;  Laterality: Left;  . ARTERIAL BYPASS SURGERY   07/05/2010   Right Common Femoral to below knee popliteal BPG  . BACK SURGERY     X's  2  . CARDIAC CATHETERIZATION    . CHOLECYSTECTOMY     Gall Bladder  . CYSTECTOMY Left    wrist  . EMBOLECTOMY Left 11/28/2017   Procedure: Left Lower Extremity Embolectomy, Left Tibial Peroneal Trunk Endarterectomy with Patch Angioplasty; Vein Harvest Small Saphenous Graft Left Lower Leg;  Surgeon: [name], MD;  Location: [facility];  Service: Vascular;  Laterality: Left;  . EYE SURGERY Left 02/23/2020   Dr. Zadie Rhine  . FEMORAL-POPLITEAL BYPASS GRAFT Left 03/27/2018   Procedure: THROMBECTOMY OF LEFT FEMORAL TIBIAL BYPASS;   Surgeon: Serafina Mitchell, MD;  Location: [facility];  Service: Vascular;  Laterality: Left;  . FEMORAL-TIBIAL BYPASS GRAFT Left 03/27/2018   Procedure: BYPASS GRAFT FEMORAL-TIBIAL ARTERY LEFT REDO USING CRYOPRESERVED SAPHENOUS VEIN 70cm;  Surgeon: Serafina Mitchell, MD;  Location: [facility] OR;  Service: Vascular;  Laterality: Left;  . INTERCOSTAL NERVE BLOCK  November 2015  . INTRAOPERATIVE ARTERIOGRAM  11/28/2017   Procedure: INTRA OPERATIVE ARTERIOGRAM OF LEFT LEG;  Surgeon: Serafina Mitchell, MD;  Location: [facility];  Service: Vascular;;  . INTRAOPERATIVE ARTERIOGRAM Left 03/27/2018   Procedure: INTRA OPERATIVE ARTERIOGRAM TIMES TWO;  Surgeon: Serafina Mitchell, MD;  Location: [facility];  Service: Vascular;  Laterality: Left;  . IR FLUORO GUIDE CV LINE RIGHT  03/20/2017  . IR FLUORO GUIDE CV LINE RIGHT  05/05/2018  . IR REMOVAL TUN CV CATH W/O FL  05/20/2018  . IR US GUIDE VASC ACCESS RIGHT  03/20/2017  . IR US GUIDE VASC ACCESS RIGHT  05/05/2018  . IRRIGATION  AND DEBRIDEMENT BUTTOCKS Right 09/30/2016   Procedure: DEBRIDEMENT RIGHT  BUTTOCK WOUND;  Surgeon: Georganna Skeans, MD;  Location: [facility];  Service: General;  Laterality: Right;  . left foot surgery    . PERIPHERAL VASCULAR CATHETERIZATION N/A 05/07/2016   Procedure: Abdominal Aortogram;  Surgeon: Serafina Mitchell, MD;  Location: [facility] CV LAB;  Service: Cardiovascular;  Laterality: N/A;  . PERIPHERAL VASCULAR CATHETERIZATION N/A 05/07/2016   Procedure: Lower Extremity Angiography;  Surgeon: Serafina Mitchell, MD;  Location: [facility] CV LAB;  Service: Cardiovascular;  Laterality: N/A;  . PERIPHERAL VASCULAR CATHETERIZATION N/A 05/07/2016   Procedure: Aortic Arch Angiography;  Surgeon: Serafina Mitchell, MD;  Location: [facility] CV LAB;  Service: Cardiovascular;  Laterality: N/A;  . PERIPHERAL VASCULAR CATHETERIZATION N/A 05/07/2016   Procedure: Upper Extremity Angiography;  Surgeon: Serafina Mitchell, MD;  Location: [facility];  Service: Cardiovascular;   Laterality: N/A;  . PERIPHERAL VASCULAR CATHETERIZATION Right 05/07/2016   Procedure: Peripheral Vascular Balloon Angioplasty;  Surgeon: Serafina Mitchell, MD;  Location: [facility] LAB;  Service: Cardiovascular;  Laterality: Right;  subclavian  . PERIPHERAL VASCULAR CATHETERIZATION Right 05/07/2016   Procedure: Peripheral Vascular Intervention;  Surgeon: Serafina Mitchell, MD;  Location: [facility] CV LAB;  Service: Cardiovascular;  Laterality: Right;  External  Iliac  . SKIN GRAFT Right 2012   [name] by Dr. Nils Pyle- Right and Left Ankle  . SPINE SURGERY    . THROMBECTOMY FEMORAL ARTERY  11/28/2017   Procedure: THROMBECTOMY  & REVISION OF BILATERAL FEMORAL TO POPLETEAL ARTERIES;  Surgeon: Serafina Mitchell, MD;  Location: [facility];  Service: Vascular;;  . TONSILLECTOMY    . TRANSMETATARSAL AMPUTATION Right 10/07/2020   Procedure: RIGHT BELOW KNEE AMPUTATION;  Surgeon: Erle Crocker, MD;  Location: [facility];  Service: Orthopedics;  Laterality: Right;     reports that she has been smoking cigarettes. She has a 30.00 pack-year smoking history. She has never used smokeless tobacco. She reports that she does not drink alcohol and does not use drugs.  Allergies  Allergen Reactions  . Lactose Intolerance (Gi) Diarrhea    Family History  Problem Relation Age of Onset  . Coronary artery disease Mother   . Peripheral vascular disease Mother   . Heart disease Mother        Before age 53  . Other Mother        Venous insuffiency  . Diabetes Mother   . Hyperlipidemia Mother   . Hypertension Mother   . Varicose Veins Mother   . Heart attack Mother        before age 97  . Heart disease Father   . Diabetes Father   . Diabetes Maternal Grandmother   . Diabetes Paternal Grandmother   . Diabetes Paternal Grandfather   . Diabetes Sister   . Hypertension Sister   . Diabetes Brother   . Hypertension Brother     Prior to Admission medications   Medication Sig Start Date End Date Taking?  Authorizing Provider  albuterol (PROVENTIL) 2 MG tablet Take 1 tablet (2 mg total) by mouth daily as needed for shortness of breath. 12/05/20  Yes Gerlene Fee, NP  amiodarone (PACERONE) 200 MG tablet Take 1 tablet (200 mg total) by mouth See admin instructions. oral amiodarone 400mg  bid x 7 days, then 200mg  bid x 2 weeks, then 200mg  daily after that 12/05/20  Yes Gerlene Fee, NP  amitriptyline (ELAVIL) 25 MG tablet Take 1  tablet (25 mg total) by mouth at bedtime. 12/05/20  Yes Gerlene Fee, NP  Balsam [place]-Castor Oil [facility]) OINT Apply 1 application topically 3 (three) times daily. Every shift to bilateral buttocks, coccyx, and sacrum   Yes [provider]  dicyclomine (BENTYL) 10 MG capsule Take 1 capsule (10 mg total) by mouth every 6 (six) hours as needed for spasms. 12/05/20  Yes Gerlene Fee, NP  insulin glargine (LANTUS) 100 UNIT/ML injection Inject 0.18 mLs (18 Units total) into the skin 2 (two) times daily. 12/05/20  Yes Gerlene Fee, NP  insulin lispro (HUMALOG KWIKPEN) 100 UNIT/ML KwikPen Inject 4-10 Units into the skin 3 (three) times daily with meals. If blood sugar 121-150, 2 units If blood sugar 151-200, 4 units If blood sugar 201-250, 6 units If blood sugar 251-300, 8 units If blood sugar 301-500, 10 units If blood sugar greater than 350 call MD 12/05/20  Yes Gerlene Fee, NP  metoprolol tartrate (LOPRESSOR) 25 MG tablet Take 0.5 tablets (12.5 mg total) by mouth 2 (two) times daily. 12/05/20  Yes Gerlene Fee, NP  naloxone [facility]) nasal spray 4 mg/0.1 mL Place 1 spray into the nose once as needed (opioid overdose). 12/05/20  Yes Gerlene Fee, NP  nicotine (NICODERM CQ - DOSED IN MG/24 HOURS) 21 mg/24hr patch Place 21 mg onto the skin daily. Rotate sites and remove old patch prior to application   Yes [provider]  omeprazole (PRILOSEC) 40 MG capsule Take 1 capsule (40 mg total) by mouth daily. 12/05/20  Yes Gerlene Fee, NP  Potassium  Chloride ER 20 MEQ TBCR Take 20 mEq by mouth daily. 1 tab daily by mouth 12/05/20  Yes Gerlene Fee, NP  Rivaroxaban (XARELTO) 15 MG TABS tablet Take 1 tablet (15 mg total) by mouth daily with supper. 12/05/20  Yes Gerlene Fee, NP  rosuvastatin (CRESTOR) 20 MG tablet Take 1 tablet (20 mg total) by mouth daily at 6 PM. 12/05/20  Yes Green, Phylis Bougie, NP  sodium bicarbonate 650 MG tablet Take 1 tablet (650 mg total) by mouth 2 (two) times daily. 12/05/20  Yes Gerlene Fee, NP  torsemide (DEMADEX) 20 MG tablet Take 1 tablet (20 mg total) by mouth daily. 12/05/20  Yes Gerlene Fee, NP  vitamin B-12 1000 MCG tablet Take 1 tablet (1,000 mcg total) by mouth daily. 10/12/20  Yes Pahwani, Rinka R, MD  LYRICA 50 MG capsule Take 1 capsule (50 mg total) by mouth 2 (two) times daily. Patient not taking: Reported on 03/08/2021 12/02/20   Yvonna Alanis, NP    Physical Exam: Vitals:   03/08/21 1231 03/08/21 1300 03/08/21 1400 03/08/21 1514  BP: (!) 162/83 125/87 (!) 141/74 (!) 142/64  Pulse: 82 83 82 85  Resp: 14 12 (!) 21 17  Temp:      TempSrc:      SpO2: 96% 97% 97% 100%  Weight:        Constitutional: NAD, calm, comfortable Vitals:   03/08/21 1231 03/08/21 1300 03/08/21 1400 03/08/21 1514  BP: (!) 162/83 125/87 (!) 141/74 (!) 142/64  Pulse: 82 83 82 85  Resp: 14 12 (!) 21 17  Temp:      TempSrc:      SpO2: 96% 97% 97% 100%  Weight:       Eyes: PERRL, lids and conjunctivae normal ENMT: Mucous membranes are moist.  Poor dentition overall. Neck: normal, supple, no masses, no thyromegaly Respiratory: clear to  auscultation bilaterally, no wheezing, no crackles. Normal respiratory effort. No accessory muscle use.  Cardiovascular: Regular rate and rhythm, no murmurs / rubs / gallops. No extremity edema. 2+ pedal pulses.   Abdomen: obese, no tenderness, no masses palpated. No hepatosplenomegaly. Bowel sounds positive.  Musculoskeletal: no clubbing / cyanosis.  Bilateral above-knee  amputation.  Good ROM, no contractures. Normal muscle tone.  Skin: no rashes, lesions, ulcers. No induration Neurologic: No apparent cranial functionality, moving extremities spontaneously. Psychiatric: Normal judgment and insight. Alert and oriented x 3. Normal mood.   Labs on Admission: I have personally reviewed following labs and imaging studies  CBC: Recent Labs  Lab 03/08/21 1055  WBC 6.6  NEUTROABS 5.1  HGB 11.6*  HCT 37.7  MCV 82.5  PLT 694*   Basic Metabolic Panel: Recent Labs  Lab 03/08/21 1055  NA 138  K 2.9*  CL 99  CO2 28  GLUCOSE 83  BUN 7  CREATININE 1.21*  CALCIUM 5.5*  MG 0.8*    Radiological Exams on Admission: No results found.  EKG: Independently reviewed.  Sinus rhythm rate 73.  QTc 536.  PVCs present.  LPF B.  No significant change from prior.  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  Magnesium 0.8.  Calcium 5.5.  Likely from diarrhea, poor oral intake, intermittent vomiting.  She takes torsemide very occasionally, last use ~ 2 weeks ago.  No significant alcohol intake. -Replete electrolytes - Recheck in the morning  - N/s + 40 KCL 75cc/hr x 12hrs  Diarrhea-of 4 to 5 weeks duration, with intermittent vomiting.  Abdominal exam benign.  Afebrile without leukocytosis. -Obtain stool studies  Atrial fibrillation-rate controlled and on anticoagulation.  Currently in sinus rhythm. -Resume Xarelto -Resume amiodarone  Chronic combined systolic and diastolic CHF-stable and compensated.  Intermittent torsemide use.  Echo 11/2020, EF 35 to 40%, minutes LV diastolic parameters. -Hold torsemide  Controlled Diabetes mellitus-random glucose 83.  Spouse  reports he has not given patient insulin in several days because of poor oral intake. Hgba1c 5.7. - SSI- S  CKD 4-creatinine 1.2 today, significant improvement from baseline of about 2.1.  Peripheral vascular disease, bilateral above-knee amputations  Prolonged QTC 536.  Likely related to electrolyte abnormalities.  Patient is on amiodarone and amitriptyline.  -Resume amiodarone -Amitriptyline held for now pending correction of electrolytes. - EKG tonmorrow  DVT prophylaxis: Xarelto Code Status: Full code Family Communication: Spouse at bedside Disposition Plan:  ~ 1 -2 days Consults called: None Admission status: Obs, tele  Bethena Roys MD [name]  03/08/2021, 5:03 PM

## 2021-03-08 NOTE — ED Triage Notes (Signed)
Pt had blood drawn yesterday from PCP and was called today stating her potassium was low.   Pt has had diarrhea x 5 weeks

## 2021-03-08 NOTE — ED Notes (Signed)
Attempted to call report to receiving nurse, no answer at this time.

## 2021-03-08 NOTE — ED Notes (Signed)
Patient repositioned in bed with patient placed slightly on her right side.

## 2021-03-08 NOTE — ED Provider Notes (Signed)
[facility] Provider Note   CSN: [id number] Arrival date & time: 03/08/21  1002     History Chief Complaint  Patient presents with  . Abnormal Lab    [name] is a 55 y.o. female.  HPI She presents for evaluation of "low potassium."  She states her doctor checked her blood counts yesterday and called her this morning and told her her potassium was low and to come here for treatment.  These tests were done at a clinic in [place].  The results are not available in the EMR.  She states she saw her doctor yesterday after 5 weeks of diarrhea.  She is due to get a colonoscopy at some point to evaluate the diarrhea.  She has not seen any blood in the stool.  She typically has 2 or 3 loose bowel movements a day.  She has sporadic vomiting.  She denies abdominal pain, dizziness or weakness.  She came here today by private vehicle.  She has to mobilize in a wheelchair because of bilateral leg amputations.  There are no other known modifying factors    Past Medical History:  Diagnosis Date  . Abnormal stress test    a. 02/2017 MV: large region of fixed perfusion defect in basal to mid inf, mid-dist inflat walls, EF 43%. No ischemia (EF 55-60% by f/u echo).  . Arthritis   . Asthma   . Carotid arterial disease ([place])    a. 09/2017 Carotid U/S: 40-49% bilat ICA stenosis.  . Chronic back pain   . Coronary artery calcification seen on CT scan    a. 11/2017 CT Abd/Pelvis: Multi vessel coronary vascular Ca2+.  . Depression   . Diabetes mellitus   . Diabetic neuropathy ([place])   . Difficult intubation    DIFFICULT AIRWAY/FYI  . Family history of adverse reaction to anesthesia    mother had difficlty waking   . Femoral-popliteal bypass graft occlusion, left ([place]) 12/02/2017  . GERD (gastroesophageal reflux disease)   . History of echocardiogram    a. 03/2017 Echo: EF 55-60%, mild LVH, nl RV fxn.  . Hyperlipidemia   . Ischemic cardiomyopathy    a. 04/2018 Echo: EF 30-35%,  Gr2 DD, mild LVH. Mild MR. Mildly dil LA, mod dil RV w/ mod red RV fxn, mild TR, PASP 68mmHg.  . NSTEMI (non-ST elevated myocardial infarction) ([place])    a. 05/2018 in setting of Afib, sepsis, and post-op L AKA. Peak trop 7.7. EF 30-35% by echo-->cath not performed 2/2 renal failure.  . 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 PAD (peripheral artery disease) ([place])    a. S/p L fem-pop bypass; b. 11/2017 s/p Aortobifem bypass 2/2 graft occlusion; c. 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  . Paroxysmal atrial fibrillation with rapid ventricular response ([place]) 12/02/2017   a. CHA2DS2VASc = 3-->Xarelto; b. 05/2018 Recurrent Afib-->amio.  Marland Kitchen Renal insufficiency   . Ulcer    Foot    Patient Active Problem List   Diagnosis Date Noted  . 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 CKD stage 4 due to type 2 diabetes mellitus ([place]) 12/05/2020  . Hyperlipidemia associated with type 2 diabetes mellitus ([place]) 12/05/2020  . Controlled type 2 diabetes mellitus with stage 4 chronic kidney disease, with long-term current use of insulin ([place]) 12/05/2020  . S/P BKA (below knee amputation) unilateral, right ([place]) 12/05/2020  . Anemia due to stage  4 chronic kidney disease ([place]) 12/05/2020  . Leukopenia 11/29/2020  . Elevated brain natriuretic peptide (BNP) level 11/29/2020  . Elevated d-dimer 11/29/2020  . Prolonged QT interval 11/29/2020  . Stable treated proliferative diabetic retinopathy of left eye determined by examination associated with type 2 diabetes mellitus ([place]) 08/14/2020  . Macular pucker, left eye 02/24/2020  . Follow-up examination after eye surgery 02/24/2020  . Coronary artery disease involving native coronary artery of native heart without angina pectoris 11/21/2018  . Ischemic cardiomyopathy 11/21/2018  . Chronic systolic heart failure ([place]) 11/21/2018  . Essential hypertension 11/21/2018  . Labile blood  pressure   . Diabetic retinopathy associated with type 2 diabetes mellitus ([place])   . Hypoalbuminemia due to protein-calorie malnutrition ([place])   . Type 2 diabetes mellitus with peripheral neuropathy (HCC)   . Chronic diastolic congestive heart failure ([place])   . Unilateral AKA, left ([place])   . Atrial fibrillation with rapid ventricular response ([place])   . Diabetic peripheral neuropathy ([place])   . Anemia of chronic disease   . Gastroesophageal reflux disease   . Non-ST elevation (NSTEMI) myocardial infarction ([place])   . Acute combined systolic and diastolic heart failure ([place])   . Acute respiratory failure with hypoxia ([place])   . Microcytic anemia 04/25/2018  . Pulmonary nodule 04/25/2018  . Gangrene of lower extremity ([place]) 04/25/2018  . Femoral-popliteal bypass graft occlusion, left ([place]) 12/02/2017  . Respiratory failure with hypoxia ([place]) 12/02/2017  . Leukocytosis 12/02/2017  . Paroxysmal atrial fibrillation with rapid ventricular response ([place]) 12/02/2017  . Hyperkalemia 06/26/2017  . Elevated lactic acid level   . Chronic back pain 11/27/2016  . Hyperlipidemia LDL goal <70   . Retinal tumor of right eye   . 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 Paresthesia of both hands 03/05/2016  . DDD (degenerative disc disease), lumbosacral 03/26/2015  . DDD (degenerative disc disease), cervical 03/26/2015  . Sacroiliac joint disease 03/26/2015  . Occipital neuralgia 03/26/2015  . Pain in limb-Bilateral Leg 04/25/2014  . Peripheral vascular disease ([place]) 04/05/2013  . Chronic total occlusion of artery of the extremities ([place]) 02/10/2012    Past Surgical History:  Procedure Laterality Date  . ABDOMINAL AORTAGRAM  June 15, 2014  . ABDOMINAL AORTAGRAM N/A 06/15/2014   Procedure: ABDOMINAL Maxcine Ham;  Surgeon: Serafina Mitchell, MD;  Location: [facility] CATH LAB;  Service: Cardiovascular;  Laterality: N/A;  . ABDOMINAL AORTAGRAM N/A 11/22/2014   Procedure: ABDOMINAL AORTAGRAM;  Surgeon:  Serafina Mitchell, MD;  Location: [facility] CATH LAB;  Service: Cardiovascular;  Laterality: N/A;  . ABDOMINAL AORTOGRAM W/LOWER EXTREMITY N/A 01/07/2017   Procedure: Abdominal Aortogram w/Lower Extremity;  Surgeon: Serafina Mitchell, MD;  Location: [facility] CV LAB;  Service: Cardiovascular;  Laterality: N/A;  . ABDOMINAL AORTOGRAM W/LOWER EXTREMITY N/A 10/31/2017   Procedure: ABDOMINAL AORTOGRAM W/LOWER EXTREMITY;  Surgeon: Elam Dutch, MD;  Location: [facility] CV LAB;  Service: Cardiovascular;  Laterality: N/A;  . ABDOMINAL AORTOGRAM W/LOWER EXTREMITY N/A 03/24/2018   Procedure: ABDOMINAL AORTOGRAM W/LOWER EXTREMITY;  Surgeon: Serafina Mitchell, MD;  Location: [facility] CV LAB;  Service: Cardiovascular;  Laterality: N/A;  . AMPUTATION Left 04/26/2018   Procedure: AMPUTATION ABOVE KNEE;  Surgeon: Elam Dutch, MD;  Location: [facility];  Service: Vascular;  Laterality: Left;  . AORTA - BILATERAL FEMORAL ARTERY BYPASS GRAFT N/A 11/28/2017   Procedure: AORTA BIFEMORAL BYPASS USING HEMASHIELD GOLD GRAFT & REIMPLANT IMA;  Surgeon: Serafina Mitchell, MD;  Location: [facility];  Service:  Vascular;  Laterality: N/A;  . AORTIC ARCH ANGIOGRAPHY N/A 10/31/2017   Procedure: AORTIC ARCH ANGIOGRAPHY;  Surgeon: Elam Dutch, MD;  Location: [facility] CV LAB;  Service: Cardiovascular;  Laterality: N/A;  . APPLICATION OF WOUND VAC  11/28/2017   Procedure: APPLICATION OF WOUND VAC;  Surgeon: Serafina Mitchell, MD;  Location: [facility] OR;  Service: Vascular;;  . APPLICATION OF WOUND VAC Left 03/27/2018   Procedure: APPLICATION OF WOUND VAC LEFT GROIN USING PREVENA PLUS;  Surgeon: Serafina Mitchell, MD;  Location: [facility];  Service: Vascular;  Laterality: Left;  . ARTERIAL BYPASS SURGERY   07/05/2010   Right Common Femoral to below knee popliteal BPG  . BACK SURGERY     X's  2  . CARDIAC CATHETERIZATION    . CHOLECYSTECTOMY     Gall Bladder  . CYSTECTOMY Left    wrist  . EMBOLECTOMY Left 11/28/2017   Procedure: Left Lower  Extremity Embolectomy, Left Tibial Peroneal Trunk Endarterectomy with Patch Angioplasty; Vein Harvest Small Saphenous Graft Left Lower Leg;  Surgeon: [name], MD;  Location: [facility];  Service: Vascular;  Laterality: Left;  . EYE SURGERY Left 02/23/2020   Dr. Zadie Rhine  . FEMORAL-POPLITEAL BYPASS GRAFT Left 03/27/2018   Procedure: THROMBECTOMY OF LEFT FEMORAL TIBIAL BYPASS;  Surgeon: Serafina Mitchell, MD;  Location: [facility];  Service: Vascular;  Laterality: Left;  . FEMORAL-TIBIAL BYPASS GRAFT Left 03/27/2018   Procedure: BYPASS GRAFT FEMORAL-TIBIAL ARTERY LEFT REDO USING CRYOPRESERVED SAPHENOUS VEIN 70cm;  Surgeon: Serafina Mitchell, MD;  Location: [facility] OR;  Service: Vascular;  Laterality: Left;  . INTERCOSTAL NERVE BLOCK  November 2015  . INTRAOPERATIVE ARTERIOGRAM  11/28/2017   Procedure: INTRA OPERATIVE ARTERIOGRAM OF LEFT LEG;  Surgeon: Serafina Mitchell, MD;  Location: [facility];  Service: Vascular;;  . INTRAOPERATIVE ARTERIOGRAM Left 03/27/2018   Procedure: INTRA OPERATIVE ARTERIOGRAM TIMES TWO;  Surgeon: Serafina Mitchell, MD;  Location: [facility];  Service: Vascular;  Laterality: Left;  . IR FLUORO GUIDE CV LINE RIGHT  03/20/2017  . IR FLUORO GUIDE CV LINE RIGHT  05/05/2018  . IR REMOVAL TUN CV CATH W/O FL  05/20/2018  . IR US GUIDE VASC ACCESS RIGHT  03/20/2017  . IR US GUIDE VASC ACCESS RIGHT  05/05/2018  . IRRIGATION AND DEBRIDEMENT BUTTOCKS Right 09/30/2016   Procedure: DEBRIDEMENT RIGHT  BUTTOCK WOUND;  Surgeon: Georganna Skeans, MD;  Location: [facility];  Service: General;  Laterality: Right;  . left foot surgery    . PERIPHERAL VASCULAR CATHETERIZATION N/A 05/07/2016   Procedure: Abdominal Aortogram;  Surgeon: Serafina Mitchell, MD;  Location: [facility] CV LAB;  Service: Cardiovascular;  Laterality: N/A;  . PERIPHERAL VASCULAR CATHETERIZATION N/A 05/07/2016   Procedure: Lower Extremity Angiography;  Surgeon: Serafina Mitchell, MD;  Location: [facility] CV LAB;  Service: Cardiovascular;  Laterality:  N/A;  . PERIPHERAL VASCULAR CATHETERIZATION N/A 05/07/2016   Procedure: Aortic Arch Angiography;  Surgeon: Serafina Mitchell, MD;  Location: [facility] CV LAB;  Service: Cardiovascular;  Laterality: N/A;  . PERIPHERAL VASCULAR CATHETERIZATION N/A 05/07/2016   Procedure: Upper Extremity Angiography;  Surgeon: Serafina Mitchell, MD;  Location: [facility] CV LAB;  Service: Cardiovascular;  Laterality: N/A;  . PERIPHERAL VASCULAR CATHETERIZATION Right 05/07/2016   Procedure: Peripheral Vascular Balloon Angioplasty;  Surgeon: Serafina Mitchell, MD;  Location: [facility] CV LAB;  Service: Cardiovascular;  Laterality: Right;  subclavian  . PERIPHERAL VASCULAR CATHETERIZATION Right 05/07/2016   Procedure: Peripheral Vascular  Intervention;  Surgeon: Serafina Mitchell, MD;  Location: [facility] CV LAB;  Service: Cardiovascular;  Laterality: Right;  External  Iliac  . SKIN GRAFT Right 2012   [name] by Dr. Nils Pyle- Right and Left Ankle  . SPINE SURGERY    . THROMBECTOMY FEMORAL ARTERY  11/28/2017   Procedure: THROMBECTOMY  & REVISION OF BILATERAL FEMORAL TO POPLETEAL ARTERIES;  Surgeon: Serafina Mitchell, MD;  Location: [facility];  Service: Vascular;;  . TONSILLECTOMY    . TRANSMETATARSAL AMPUTATION Right 10/07/2020   Procedure: RIGHT BELOW KNEE AMPUTATION;  Surgeon: Erle Crocker, MD;  Location: [facility];  Service: Orthopedics;  Laterality: Right;     OB History   No obstetric history on file.     Family History  Problem Relation Age of Onset  . Coronary artery disease Mother   . Peripheral vascular disease Mother   . Heart disease Mother        Before age 22  . Other Mother        Venous insuffiency  . Diabetes Mother   . Hyperlipidemia Mother   . Hypertension Mother   . Varicose Veins Mother   . Heart attack Mother        before age 28  . Heart disease Father   . Diabetes Father   . Diabetes Maternal Grandmother   . Diabetes Paternal Grandmother   . Diabetes Paternal Grandfather   . Diabetes  Sister   . Hypertension Sister   . Diabetes Brother   . Hypertension Brother     Social History   Tobacco Use  . Smoking status: Current Some Day Smoker    Packs/day: 1.00    Years: 30.00    Pack years: 30.00    Types: Cigarettes    Last attempt to quit: 02/07/2017    Years since quitting: 4.0  . Smokeless tobacco: Never Used  Vaping Use  . Vaping Use: Never used  Substance Use Topics  . Alcohol use: No    Alcohol/week: 0.0 standard drinks  . Drug use: No    Home Medications Prior to Admission medications   Medication Sig Start Date End Date Taking? Authorizing Provider  albuterol (PROVENTIL) 2 MG tablet Take 1 tablet (2 mg total) by mouth daily as needed for shortness of breath. 12/05/20  Yes Gerlene Fee, NP  amiodarone (PACERONE) 200 MG tablet Take 1 tablet (200 mg total) by mouth See admin instructions. oral amiodarone 400mg  bid x 7 days, then 200mg  bid x 2 weeks, then 200mg  daily after that 12/05/20  Yes Gerlene Fee, NP  amitriptyline (ELAVIL) 25 MG tablet Take 1 tablet (25 mg total) by mouth at bedtime. 12/05/20  Yes Gerlene Fee, NP  Balsam [place]-Castor Oil [facility]) OINT Apply 1 application topically 3 (three) times daily. Every shift to bilateral buttocks, coccyx, and sacrum   Yes [provider]  dicyclomine (BENTYL) 10 MG capsule Take 1 capsule (10 mg total) by mouth every 6 (six) hours as needed for spasms. 12/05/20  Yes Gerlene Fee, NP  insulin glargine (LANTUS) 100 UNIT/ML injection Inject 0.18 mLs (18 Units total) into the skin 2 (two) times daily. 12/05/20  Yes Gerlene Fee, NP  insulin lispro (HUMALOG KWIKPEN) 100 UNIT/ML KwikPen Inject 4-10 Units into the skin 3 (three) times daily with meals. If blood sugar 121-150, 2 units If blood sugar 151-200, 4 units If blood sugar 201-250, 6 units If blood sugar 251-300, 8 units If blood sugar 301-500, 10  units If blood sugar greater than 350 call MD 12/05/20  Yes Gerlene Fee, NP  metoprolol  tartrate (LOPRESSOR) 25 MG tablet Take 0.5 tablets (12.5 mg total) by mouth 2 (two) times daily. 12/05/20  Yes Gerlene Fee, NP  naloxone [facility]) nasal spray 4 mg/0.1 mL Place 1 spray into the nose once as needed (opioid overdose). 12/05/20  Yes Gerlene Fee, NP  nicotine (NICODERM CQ - DOSED IN MG/24 HOURS) 21 mg/24hr patch Place 21 mg onto the skin daily. Rotate sites and remove old patch prior to application   Yes [provider]  omeprazole (PRILOSEC) 40 MG capsule Take 1 capsule (40 mg total) by mouth daily. 12/05/20  Yes Gerlene Fee, NP  Potassium Chloride ER 20 MEQ TBCR Take 20 mEq by mouth daily. 1 tab daily by mouth 12/05/20  Yes Gerlene Fee, NP  Rivaroxaban (XARELTO) 15 MG TABS tablet Take 1 tablet (15 mg total) by mouth daily with supper. 12/05/20  Yes Gerlene Fee, NP  rosuvastatin (CRESTOR) 20 MG tablet Take 1 tablet (20 mg total) by mouth daily at 6 PM. 12/05/20  Yes Green, Phylis Bougie, NP  sodium bicarbonate 650 MG tablet Take 1 tablet (650 mg total) by mouth 2 (two) times daily. 12/05/20  Yes Gerlene Fee, NP  torsemide (DEMADEX) 20 MG tablet Take 1 tablet (20 mg total) by mouth daily. 12/05/20  Yes Gerlene Fee, NP  vitamin B-12 1000 MCG tablet Take 1 tablet (1,000 mcg total) by mouth daily. 10/12/20  Yes Pahwani, Rinka R, MD  LYRICA 50 MG capsule Take 1 capsule (50 mg total) by mouth 2 (two) times daily. Patient not taking: Reported on 03/08/2021 12/02/20   Yvonna Alanis, NP    Allergies    Lactose intolerance (gi)  Review of Systems   Review of Systems  All other systems reviewed and are negative.   Physical Exam Updated Vital Signs BP 125/87   Pulse 83   Temp 98.1 F (36.7 C) (Oral)   Resp 12   Wt 101.2 kg   SpO2 97%   BMI 34.93 kg/m   Physical Exam Vitals and nursing note reviewed.  Constitutional:      General: She is not in acute distress.    Appearance: She is well-developed. She is not ill-appearing, toxic-appearing or  diaphoretic.  HENT:     Head: Normocephalic and atraumatic.  Eyes:     Conjunctiva/sclera: Conjunctivae normal.     Pupils: Pupils are equal, round, and reactive to light.  Neck:     Trachea: Phonation normal.  Cardiovascular:     Rate and Rhythm: Normal rate and regular rhythm.  Pulmonary:     Effort: Pulmonary effort is normal.     Breath sounds: Normal breath sounds.  Chest:     Chest wall: No tenderness.  Abdominal:     General: There is no distension.     Palpations: Abdomen is soft.     Tenderness: There is no abdominal tenderness. There is no guarding.  Musculoskeletal:        General: No swelling. Normal range of motion.     Cervical back: Normal range of motion and neck supple.  Skin:    General: Skin is warm and dry.  Neurological:     Mental Status: She is alert and oriented to person, place, and time.     Motor: No abnormal muscle tone.  Psychiatric:        Mood and Affect:  Mood normal.        Behavior: Behavior normal.        Thought Content: Thought content normal.        Judgment: Judgment normal.     ED Results / Procedures / Treatments   Labs (all labs ordered are listed, but only abnormal results are displayed) Labs Reviewed  CBC WITH DIFFERENTIAL/PLATELET - Abnormal; Notable for the following components:      Result Value   Hemoglobin 11.6 (*)    MCH 25.4 (*)    RDW 18.8 (*)    Platelets 120 (*)    All other components within normal limits  BASIC METABOLIC PANEL - Abnormal; Notable for the following components:   Potassium 2.9 (*)    Creatinine, Ser 1.21 (*)    Calcium 5.5 (*)    GFR, Estimated 53 (*)    All other components within normal limits  MAGNESIUM - Abnormal; Notable for the following components:   Magnesium 0.8 (*)    All other components within normal limits  SARS CORONAVIRUS 2 (TAT 6-24 HRS)    EKG EKG Interpretation  Date/Time:  Thursday March 08 2021 11:48:17 EDT Ventricular Rate:  73 PR Interval:  190 QRS  Duration: 109 QT Interval:  486 QTC Calculation: 536 R Axis:   113 Text Interpretation: Sinus rhythm Multiform ventricular premature complexes Left posterior fascicular block Consider anterior infarct Prolonged QT interval Since last tracing Premature ventricular complexes , new and QT has lengthened Otherwise no significant change Confirmed by Daleen Bo [phone]) on 03/08/2021 12:10:30 PM   Radiology No results found.  Procedures .Critical Care Performed by: Daleen Bo, MD Authorized by: Daleen Bo, MD   Critical care provider statement:    Critical care time (minutes):  35   Critical care start time:  03/08/2021 10:15 AM   Critical care end time:  03/08/2021 1:52 PM   Critical care time was exclusive of:  Separately billable procedures and treating other patients   Critical care was necessary to treat or prevent imminent or life-threatening deterioration of the following conditions:  Metabolic crisis   Critical care was time spent personally by me on the following activities:  Blood draw for specimens, development of treatment plan with patient or surrogate, discussions with consultants, evaluation of patient's response to treatment, examination of patient, obtaining history from patient or surrogate, ordering and performing treatments and interventions, ordering and review of laboratory studies, pulse oximetry, re-evaluation of patient's condition, review of old charts and ordering and review of radiographic studies     Medications Ordered in ED Medications  0.9 %  sodium chloride infusion ( Intravenous New Bag/Given 03/08/21 1057)  magnesium sulfate IVPB 2 g 50 mL (2 g Intravenous New Bag/Given 03/08/21 1324)  potassium chloride 10 mEq in 100 mL IVPB (10 mEq Intravenous New Bag/Given 03/08/21 1321)  calcium gluconate 1 g/ 50 mL sodium chloride IVPB (1,000 mg Intravenous New Bag/Given 03/08/21 1206)    ED Course  I have reviewed the triage vital signs and the nursing  notes.  Pertinent labs & imaging results that were available during my care of the patient were reviewed by me and considered in my medical decision making (see chart for details).    MDM Rules/Calculators/A&P                           Patient Vitals for the past 24 hrs:  BP Temp Temp src Pulse Resp SpO2 Weight  03/08/21  1300 125/87 -- -- 83 12 97 % --  03/08/21 1231 (!) 162/83 -- -- 82 14 96 % --  03/08/21 1130 (!) 148/68 -- -- 75 14 95 % --  03/08/21 1100 (!) 133/110 -- -- 77 15 95 % --  03/08/21 1029 (!) 153/72 98.1 F (36.7 C) Oral 76 18 98 % --  03/08/21 1026 -- -- -- -- -- -- 101.2 kg    1:49 PM Reevaluation with update and discussion. After initial assessment and treatment, an updated evaluation reveals she is comfortable has no further complaints.  She is agreeable to hospitalization.  Blood pressure improved. Daleen Bo   Medical Decision Making:  This patient is presenting for evaluation of low potassium and diarrhea, which does require a range of treatment options, and is a complaint that involves a moderate risk of morbidity and mortality. The differential diagnoses include metabolic instability, complications from diarrhea, volume depletion. I decided to review old records, and in summary elderly female, being evaluated as outpatient for chronic diarrhea, presenting with known hypokalemia.  I did not require additional historical information from anyone.  Clinical Laboratory Tests Ordered, included CBC, Metabolic panel and Viral panel, magnesium. Review indicates normal except potassium low, creatinine slightly elevated, calcium low, GFR low, hemoglobin low, magnesium low.   Cardiac Monitor Tracing which shows normal sinus rhythm   Critical Interventions-clinical evaluation, laboratory testing, medication treatment, observation and reassessment  After These Interventions, the Patient was reevaluated and was found with significant QT prolongation from baseline.  She  has multiple electrolyte abnormalities, secondary to diarrhea.  The diarrhea is nonspecific and does not likely represent an acute infectious process.  Doubt sepsis, or significant hypovolemia.  She will require hospitalization for stabilization on a cardiac monitor.  CRITICAL CARE-yes Performed by: Daleen Bo  Nursing Notes Reviewed/ Care Coordinated Applicable Imaging Reviewed Interpretation of Laboratory Data incorporated into ED treatment  1:52 PM-Consult complete with hospitalist. Patient case explained and discussed.  She agrees to admit patient for further evaluation and treatment. Call ended at 3:18 PM  Plan: Admit    Final Clinical Impression(s) / ED Diagnoses Final diagnoses:  Hypokalemia  Hypomagnesemia  Hypocalcemia  Prolonged Q-T interval on ECG    Rx / DC Orders ED Discharge Orders    None       Daleen Bo, MD 03/08/21 [phone]

## 2021-03-09 DIAGNOSIS — K3189 Other diseases of stomach and duodenum: Secondary | ICD-10-CM | POA: Diagnosis not present

## 2021-03-09 DIAGNOSIS — Z89611 Acquired absence of right leg above knee: Secondary | ICD-10-CM | POA: Diagnosis not present

## 2021-03-09 DIAGNOSIS — E1122 Type 2 diabetes mellitus with diabetic chronic kidney disease: Secondary | ICD-10-CM | POA: Diagnosis present

## 2021-03-09 DIAGNOSIS — L89213 Pressure ulcer of right hip, stage 3: Secondary | ICD-10-CM | POA: Diagnosis present

## 2021-03-09 DIAGNOSIS — I2510 Atherosclerotic heart disease of native coronary artery without angina pectoris: Secondary | ICD-10-CM | POA: Diagnosis present

## 2021-03-09 DIAGNOSIS — N179 Acute kidney failure, unspecified: Secondary | ICD-10-CM | POA: Diagnosis present

## 2021-03-09 DIAGNOSIS — E876 Hypokalemia: Secondary | ICD-10-CM | POA: Diagnosis present

## 2021-03-09 DIAGNOSIS — I130 Hypertensive heart and chronic kidney disease with heart failure and stage 1 through stage 4 chronic kidney disease, or unspecified chronic kidney disease: Secondary | ICD-10-CM | POA: Diagnosis present

## 2021-03-09 DIAGNOSIS — I252 Old myocardial infarction: Secondary | ICD-10-CM | POA: Diagnosis not present

## 2021-03-09 DIAGNOSIS — Z20822 Contact with and (suspected) exposure to covid-19: Secondary | ICD-10-CM | POA: Diagnosis present

## 2021-03-09 DIAGNOSIS — E739 Lactose intolerance, unspecified: Secondary | ICD-10-CM | POA: Diagnosis present

## 2021-03-09 DIAGNOSIS — I5042 Chronic combined systolic (congestive) and diastolic (congestive) heart failure: Secondary | ICD-10-CM | POA: Diagnosis present

## 2021-03-09 DIAGNOSIS — E1151 Type 2 diabetes mellitus with diabetic peripheral angiopathy without gangrene: Secondary | ICD-10-CM | POA: Diagnosis present

## 2021-03-09 DIAGNOSIS — N184 Chronic kidney disease, stage 4 (severe): Secondary | ICD-10-CM | POA: Diagnosis present

## 2021-03-09 DIAGNOSIS — E878 Other disorders of electrolyte and fluid balance, not elsewhere classified: Secondary | ICD-10-CM | POA: Diagnosis not present

## 2021-03-09 DIAGNOSIS — D122 Benign neoplasm of ascending colon: Secondary | ICD-10-CM | POA: Diagnosis not present

## 2021-03-09 DIAGNOSIS — R197 Diarrhea, unspecified: Secondary | ICD-10-CM | POA: Diagnosis not present

## 2021-03-09 DIAGNOSIS — I480 Paroxysmal atrial fibrillation: Secondary | ICD-10-CM | POA: Diagnosis present

## 2021-03-09 DIAGNOSIS — E8809 Other disorders of plasma-protein metabolism, not elsewhere classified: Secondary | ICD-10-CM | POA: Diagnosis present

## 2021-03-09 DIAGNOSIS — J45909 Unspecified asthma, uncomplicated: Secondary | ICD-10-CM | POA: Diagnosis present

## 2021-03-09 DIAGNOSIS — L89313 Pressure ulcer of right buttock, stage 3: Secondary | ICD-10-CM | POA: Diagnosis present

## 2021-03-09 DIAGNOSIS — D124 Benign neoplasm of descending colon: Secondary | ICD-10-CM | POA: Diagnosis not present

## 2021-03-09 DIAGNOSIS — I255 Ischemic cardiomyopathy: Secondary | ICD-10-CM | POA: Diagnosis present

## 2021-03-09 DIAGNOSIS — D123 Benign neoplasm of transverse colon: Secondary | ICD-10-CM | POA: Diagnosis not present

## 2021-03-09 DIAGNOSIS — E1140 Type 2 diabetes mellitus with diabetic neuropathy, unspecified: Secondary | ICD-10-CM | POA: Diagnosis present

## 2021-03-09 DIAGNOSIS — K219 Gastro-esophageal reflux disease without esophagitis: Secondary | ICD-10-CM | POA: Diagnosis present

## 2021-03-09 DIAGNOSIS — E785 Hyperlipidemia, unspecified: Secondary | ICD-10-CM | POA: Diagnosis present

## 2021-03-09 DIAGNOSIS — Z89612 Acquired absence of left leg above knee: Secondary | ICD-10-CM | POA: Diagnosis not present

## 2021-03-09 LAB — BASIC METABOLIC PANEL
Anion gap: 11 (ref 5–15)
BUN: 7 mg/dL (ref 6–20)
CO2: 27 mmol/L (ref 22–32)
Calcium: 5.9 mg/dL — CL (ref 8.9–10.3)
Chloride: 99 mmol/L (ref 98–111)
Creatinine, Ser: 1.11 mg/dL — ABNORMAL HIGH (ref 0.44–1.00)
GFR, Estimated: 58 mL/min — ABNORMAL LOW (ref >60)
Glucose, Bld: 107 mg/dL — ABNORMAL HIGH (ref 70–99)
Potassium: 2.1 mmol/L — CL (ref 3.5–5.1)
Sodium: 137 mmol/L (ref 135–145)

## 2021-03-09 LAB — HEMOGLOBIN A1C
Hgb A1c MFr Bld: 5.6 % (ref 4.8–5.6)
Mean Plasma Glucose: 114.02 mg/dL

## 2021-03-09 LAB — GASTROINTESTINAL PANEL BY PCR, STOOL (REPLACES STOOL CULTURE)

## 2021-03-09 LAB — GLUCOSE, CAPILLARY
Glucose-Capillary: 206 mg/dL — ABNORMAL HIGH (ref 70–99)
Glucose-Capillary: 116 mg/dL — ABNORMAL HIGH (ref 70–99)

## 2021-03-09 LAB — MAGNESIUM: Magnesium: 1.2 mg/dL — ABNORMAL LOW (ref 1.7–2.4)

## 2021-03-09 MED ORDER — POTASSIUM CHLORIDE 10 MEQ/100ML IV SOLN
10.0000 meq | INTRAVENOUS | Status: AC
  Administered 2021-03-09 (×4): 10 meq via INTRAVENOUS
  Filled 2021-03-09 (×6): qty 100

## 2021-03-09 MED ORDER — RIVAROXABAN 20 MG PO TABS
20.0000 mg | ORAL_TABLET | Freq: Every day | ORAL | Status: DC
  Administered 2021-03-09 – 2021-03-10 (×2): 20 mg via ORAL
  Filled 2021-03-09 (×2): qty 1

## 2021-03-09 MED ORDER — INSULIN ASPART 100 UNIT/ML ~~LOC~~ SOLN: 0.0000 [IU] | Freq: Every day | SUBCUTANEOUS | Status: DC

## 2021-03-09 MED ORDER — POTASSIUM CHLORIDE 10 MEQ/100ML IV SOLN
10.0000 meq | INTRAVENOUS | Status: AC
  Administered 2021-03-09 (×2): 10 meq via INTRAVENOUS

## 2021-03-09 MED ORDER — INSULIN ASPART 100 UNIT/ML ~~LOC~~ SOLN
0.0000 [IU] | Freq: Three times a day (TID) | SUBCUTANEOUS | Status: DC
  Administered 2021-03-09: 2 [IU] via SUBCUTANEOUS
  Administered 2021-03-10 (×2): 1 [IU] via SUBCUTANEOUS
  Administered 2021-03-12: 5 [IU] via SUBCUTANEOUS

## 2021-03-09 MED ORDER — METOPROLOL TARTRATE 25 MG PO TABS
12.5000 mg | ORAL_TABLET | Freq: Two times a day (BID) | ORAL | Status: DC
  Administered 2021-03-09 – 2021-03-16 (×14): 12.5 mg via ORAL
  Filled 2021-03-09 (×14): qty 1

## 2021-03-09 MED ORDER — LACTATED RINGERS IV BOLUS
1000.0000 mL | Freq: Once | INTRAVENOUS | Status: AC
  Administered 2021-03-09: 1000 mL via INTRAVENOUS

## 2021-03-09 MED ORDER — MAGNESIUM SULFATE 2 GM/50ML IV SOLN
2.0000 g | Freq: Once | INTRAVENOUS | Status: AC
  Administered 2021-03-09: 2 g via INTRAVENOUS
  Filled 2021-03-09: qty 50

## 2021-03-09 MED ORDER — MELATONIN 3 MG PO TABS
6.0000 mg | ORAL_TABLET | Freq: Once | ORAL | Status: DC
  Filled 2021-03-09 (×2): qty 2

## 2021-03-09 MED ORDER — CALCIUM GLUCONATE-NACL 1-0.675 GM/50ML-% IV SOLN
1.0000 g | Freq: Once | INTRAVENOUS | Status: AC
  Administered 2021-03-09: 1000 mg via INTRAVENOUS
  Filled 2021-03-09: qty 50

## 2021-03-09 MED ORDER — POTASSIUM CHLORIDE CRYS ER 20 MEQ PO TBCR
40.0000 meq | EXTENDED_RELEASE_TABLET | Freq: Once | ORAL | Status: AC
  Administered 2021-03-09: 40 meq via ORAL
  Filled 2021-03-09: qty 2

## 2021-03-09 MED ORDER — MELATONIN 3 MG PO TABS: 6.0000 mg | ORAL_TABLET | Freq: Every day | ORAL | Status: DC

## 2021-03-09 NOTE — Progress Notes (Signed)
PROGRESS NOTE    [name]  [name]:[id number] DOB: 1965/12/11 DOA: 03/08/2021 PCP: Frazier Richards, MD   Brief Narrative:   [name] is a 55 y.o. female with medical history significant for atrial fibrillation, systolic and diastolic CHF, diabetes mellitus, hypertension, bilateral above-knee amputation, CKD 4, NSTEMI. Patient was sent to the ED by her primary care provider (clinic in [place]) reports of low potassium.  Patient has had diarrhea over the past 4 -5 weeks.    Patient was admitted for severe electrolyte abnormalities in the setting of her home diarrhea.  Assessment & Plan:   Principal Problem:   Electrolyte abnormality Active Problems:   Peripheral vascular disease (HCC)   Chronic combined systolic (congestive) and diastolic (congestive) heart failure (HCC)   CKD stage 4 due to type 2 diabetes mellitus (HCC)   S/P bilateral above knee amputation (HCC)   Severe electrolyte abnormalities -Noted to have hypokalemia, hypomagnesemia, and hypocalcemia in the setting of severe diarrhea -Continue to replete aggressively and reevaluate in a.m. -Monitor on telemetry  Intractable diarrhea with vomiting -C. difficile study negative -GI panel pending -Abdominal exam benign  Atrial fibrillation-currently in sinus rhythm -Continue home Xarelto and amiodarone  Chronic combined systolic and diastolic CHF-stable -Plan to hold torsemide -Last 2D echocardiogram 11/2020 with LVEF 35-40%  Controlled type 2 diabetes -Patient has not taken insulin in the last several days due to poor oral intake -Hemoglobin A1c 5.7% -Continue SSI  CKD stage IV -Currently stable  Peripheral vascular disease with bilateral above-knee amputations  Stage III pressure ulcer -Wound care  Prolonged QTC -Likely related to electrolyte abnormalities -Amitriptyline held until further correction of electrolytes   DVT prophylaxis: Xarelto Code Status: Full Family Communication: None at  bedside, patient will call Disposition Plan:  Status is: Observation  The patient will require care spanning > 2 midnights and should be moved to inpatient because: Persistent severe electrolyte disturbances, IV treatments appropriate due to intensity of illness or inability to take PO and Inpatient level of care appropriate due to severity of illness  Dispo: The patient is from: Home              Anticipated d/c is to: Home              Patient currently is not medically stable to d/c.   Difficult to place patient No    Skin Assessment:  I have examined the patient's skin and I agree with the wound assessment as performed by the wound care RN as outlined below:  Pressure Injury Stage III -  Full thickness tissue loss. Subcutaneous fat may be visible but bone, tendon or muscle are NOT exposed. (Active)     Location: Ischial tuberosity  Location Orientation: Right  Staging: Stage III -  Full thickness tissue loss. Subcutaneous fat may be visible but bone, tendon or muscle are NOT exposed.  Wound Description (Comments):   Present on Admission: Yes    Consultants:   None  Procedures:   See below  Antimicrobials:   None   Subjective: Patient seen and evaluated today while eating breakfast.  She has been refusing placement of second IV access, but is now reluctantly agreeable.  She denies any significant abdominal pain.  Continues to have watery diarrhea.  She denies any nausea or vomiting.  Objective: Vitals:   03/08/21 1823 03/08/21 2116 03/09/21 0332 03/09/21 0851  BP: (!) 142/82 (!) 108/58 132/72 (!) 143/83  Pulse: 77 75 66 76  Resp:  20  18   Temp: 98.3 F (36.8 C) (!) 97.5 F (36.4 C) 98.5 F (36.9 C)   TempSrc: Oral  Oral   SpO2: 96% 96% 98%   Weight:        Intake/Output Summary (Last 24 hours) at 03/09/2021 1021 Last data filed at 03/09/2021 0900 Gross per 24 hour  Intake 1190 ml  Output 500 ml  Net 690 ml   Filed Weights   03/08/21 1026  Weight:  101.2 kg    Examination:  General exam: Appears calm and comfortable, obese Respiratory system: Clear to auscultation. Respiratory effort normal. Cardiovascular system: S1 & S2 heard, RRR.  Gastrointestinal system: Abdomen is soft Central nervous system: Alert and awake Extremities: Bilateral AKA Skin: No significant lesions noted Psychiatry: Flat affect.    Data Reviewed: I have personally reviewed following labs and imaging studies  CBC: Recent Labs  Lab 03/08/21 1055  WBC 6.6  NEUTROABS 5.1  HGB 11.6*  HCT 37.7  MCV 82.5  PLT 580*   Basic Metabolic Panel: Recent Labs  Lab 03/08/21 1055 03/09/21 0557  NA 138 137  K 2.9* 2.1*  CL 99 99  CO2 28 27  GLUCOSE 83 107*  BUN 7 7  CREATININE 1.21* 1.11*  CALCIUM 5.5* 5.9*  MG 0.8* 1.2*   GFR: Estimated Creatinine Clearance: 69.1 mL/min (A) (by C-G formula based on SCr of 1.11 mg/dL (H)). Liver Function Tests: Recent Labs  Lab 03/08/21 1548  AST 12*  ALT 7  ALKPHOS 95  BILITOT 0.9  PROT 6.5  ALBUMIN 2.7*   No results for input(s): LIPASE, AMYLASE in the last 168 hours. No results for input(s): AMMONIA in the last 168 hours. Coagulation Profile: No results for input(s): INR, PROTIME in the last 168 hours. Cardiac Enzymes: No results for input(s): CKTOTAL, CKMB, CKMBINDEX, TROPONINI in the last 168 hours. BNP (last 3 results) No results for input(s): PROBNP in the last 8760 hours. HbA1C: No results for input(s): HGBA1C in the last 72 hours. CBG: No results for input(s): GLUCAP in the last 168 hours. Lipid Profile: No results for input(s): CHOL, HDL, LDLCALC, TRIG, CHOLHDL, LDLDIRECT in the last 72 hours. Thyroid Function Tests: No results for input(s): TSH, T4TOTAL, FREET4, T3FREE, THYROIDAB in the last 72 hours. Anemia Panel: No results for input(s): VITAMINB12, FOLATE, FERRITIN, TIBC, IRON, RETICCTPCT in the last 72 hours. Sepsis Labs: No results for input(s): PROCALCITON, LATICACIDVEN in the last  168 hours.  Recent Results (from the past 240 hour(s))  SARS CORONAVIRUS 2 (TAT 6-24 HRS) Nasopharyngeal Nasopharyngeal Swab     Status: None   Collection Time: 03/08/21  1:49 PM   Specimen: Nasopharyngeal Swab  Result Value Ref Range Status   SARS Coronavirus 2 NEGATIVE NEGATIVE Final    Comment: (NOTE) SARS-CoV-2 target nucleic acids are NOT DETECTED.  The SARS-CoV-2 RNA is generally detectable in upper and lower respiratory specimens during the acute phase of infection. Negative results do not preclude SARS-CoV-2 infection, do not rule out co-infections with other pathogens, and should not be used as the sole basis for treatment or other patient management decisions. Negative results must be combined with clinical observations, patient history, and epidemiological information. The expected result is Negative.  Fact Sheet for Patients: SugarRoll.be  Fact Sheet for Healthcare Providers: https://www.woods-mathews.com/  This test is not yet approved or cleared by the [place] FDA and  has been authorized for detection and/or diagnosis of SARS-CoV-2 by FDA under an Emergency Use Authorization (EUA). This EUA will remain  in effect (meaning this test can be used) for the duration of the COVID-19 declaration under Se ction 564(b)(1) of the Act, 21 U.S.C. section 360bbb-3(b)(1), unless the authorization is terminated or revoked sooner.  Performed at [facility], [place] [address] [place], [place] [address]   C Difficile Quick Screen w PCR reflex     Status: None   Collection Time: 03/08/21  9:07 PM   Specimen: STOOL  Result Value Ref Range Status   C Diff antigen NEGATIVE NEGATIVE Final   C Diff toxin NEGATIVE NEGATIVE Final   C Diff interpretation No C. difficile detected.  Final    Comment: Performed at [facility], [address], [place], [place] [address]         Radiology Studies: No results  found.      Scheduled Meds: . amiodarone  200 mg Oral Daily  . metoprolol tartrate  12.5 mg Oral BID  . pantoprazole  40 mg Oral Daily  . Rivaroxaban  15 mg Oral Q supper  . rosuvastatin  20 mg Oral q1800  . sodium bicarbonate  650 mg Oral BID   Continuous Infusions: . potassium chloride 10 mEq (03/09/21 1016)     LOS: 0 days    Time spent: 35 minutes    Enora Trillo Darleen Crocker, DO [name]  If 7PM-7AM, please contact night-coverage [email or web address] 03/09/2021, 10:21 AM

## 2021-03-09 NOTE — Progress Notes (Signed)
Dr Manuella Ghazi and Dr Marlow Baars made aware of am lab values,, K 2.1  CA 5.9

## 2021-03-10 DIAGNOSIS — E878 Other disorders of electrolyte and fluid balance, not elsewhere classified: Secondary | ICD-10-CM | POA: Diagnosis not present

## 2021-03-10 LAB — CBC
HCT: 32 % — ABNORMAL LOW (ref 36.0–46.0)
Hemoglobin: 9.9 g/dL — ABNORMAL LOW (ref 12.0–15.0)
MCH: 25.5 pg — ABNORMAL LOW (ref 26.0–34.0)
MCHC: 30.9 g/dL (ref 30.0–36.0)
MCV: 82.5 fL (ref 80.0–100.0)
Platelets: 112 10*3/uL — ABNORMAL LOW (ref 150–400)
RBC: 3.88 MIL/uL (ref 3.87–5.11)
RDW: 18.7 % — ABNORMAL HIGH (ref 11.5–15.5)
WBC: 8.4 10*3/uL (ref 4.0–10.5)
nRBC: 0 % (ref 0.0–0.2)

## 2021-03-10 LAB — MAGNESIUM: Magnesium: 1.4 mg/dL — ABNORMAL LOW (ref 1.7–2.4)

## 2021-03-10 LAB — GLUCOSE, CAPILLARY
Glucose-Capillary: 156 mg/dL — ABNORMAL HIGH (ref 70–99)
Glucose-Capillary: 138 mg/dL — ABNORMAL HIGH (ref 70–99)
Glucose-Capillary: 167 mg/dL — ABNORMAL HIGH (ref 70–99)
Glucose-Capillary: 112 mg/dL — ABNORMAL HIGH (ref 70–99)

## 2021-03-10 LAB — BASIC METABOLIC PANEL
Anion gap: 8 (ref 5–15)
BUN: 11 mg/dL (ref 6–20)
CO2: 27 mmol/L (ref 22–32)
Calcium: 6.3 mg/dL — CL (ref 8.9–10.3)
Chloride: 101 mmol/L (ref 98–111)
Creatinine, Ser: 1.37 mg/dL — ABNORMAL HIGH (ref 0.44–1.00)
GFR, Estimated: 45 mL/min — ABNORMAL LOW (ref >60)
Glucose, Bld: 147 mg/dL — ABNORMAL HIGH (ref 70–99)
Potassium: 3.1 mmol/L — ABNORMAL LOW (ref 3.5–5.1)
Sodium: 136 mmol/L (ref 135–145)

## 2021-03-10 MED ORDER — CALCIUM GLUCONATE-NACL 1-0.675 GM/50ML-% IV SOLN
1.0000 g | Freq: Once | INTRAVENOUS | Status: AC
  Administered 2021-03-10: 1000 mg via INTRAVENOUS
  Filled 2021-03-10: qty 50

## 2021-03-10 MED ORDER — SIMETHICONE 80 MG PO CHEW
80.0000 mg | CHEWABLE_TABLET | Freq: Four times a day (QID) | ORAL | Status: DC | PRN
  Administered 2021-03-10 – 2021-03-16 (×6): 80 mg via ORAL
  Filled 2021-03-10 (×6): qty 1

## 2021-03-10 MED ORDER — POTASSIUM CHLORIDE CRYS ER 20 MEQ PO TBCR
40.0000 meq | EXTENDED_RELEASE_TABLET | Freq: Two times a day (BID) | ORAL | Status: DC
  Administered 2021-03-10 – 2021-03-11 (×4): 40 meq via ORAL
  Filled 2021-03-10 (×4): qty 2

## 2021-03-10 MED ORDER — LACTATED RINGERS IV SOLN: INTRAVENOUS | Status: AC

## 2021-03-10 MED ORDER — MAGNESIUM SULFATE 2 GM/50ML IV SOLN
2.0000 g | Freq: Once | INTRAVENOUS | Status: AC
  Administered 2021-03-10: 2 g via INTRAVENOUS
  Filled 2021-03-10: qty 50

## 2021-03-10 MED ORDER — LOPERAMIDE HCL 2 MG PO CAPS
4.0000 mg | ORAL_CAPSULE | Freq: Once | ORAL | Status: AC
  Administered 2021-03-10: 4 mg via ORAL
  Filled 2021-03-10: qty 2

## 2021-03-10 MED ORDER — LOPERAMIDE HCL 2 MG PO CAPS
4.0000 mg | ORAL_CAPSULE | Freq: Two times a day (BID) | ORAL | Status: DC
  Administered 2021-03-10 – 2021-03-16 (×11): 4 mg via ORAL
  Filled 2021-03-10 (×11): qty 2

## 2021-03-10 NOTE — Progress Notes (Signed)
MD notified of abnormal lab (calcium).  MD placed orders for abnormal lab.

## 2021-03-10 NOTE — Progress Notes (Signed)
PROGRESS NOTE    [name]  OZD:[id number] DOB: 08-20-1966 DOA: 03/08/2021 PCP: Frazier Richards, MD   Brief Narrative:   [name] a 55 y.o.femalewith medical history significant foratrial fibrillation, systolic and diastolic CHF, diabetes mellitus, hypertension,bilateral above-knee amputation,CKD 4, NSTEMI. Patient was sent to the ED by her primary care provider(clinic in [place])reports of low potassium. Patient has had diarrhea over the past 4 -5weeks.   Patient was admitted for severe electrolyte abnormalities in the setting of her home diarrhea.  Assessment & Plan:   Principal Problem:   Electrolyte abnormality Active Problems:   Peripheral vascular disease (HCC)   Chronic combined systolic (congestive) and diastolic (congestive) heart failure (HCC)   CKD stage 4 due to type 2 diabetes mellitus (HCC)   S/P bilateral above knee amputation (HCC)   Hypokalemia   Severe electrolyte abnormalities -Noted to have hypokalemia, hypomagnesemia, and hypocalcemia in the setting of severe diarrhea -Continue to replete aggressively and reevaluate in a.m. -Monitor on telemetry  Intractable diarrhea  -Likely related to lactose intolerance and pt has been consuming dairy -Lactose free diet -C. difficile study negative -GI panel negative -Start imodium 4mg  bid 4/23 -Abdominal exam benign  Atrial fibrillation-currently in sinus rhythm -Continue home Xarelto and amiodarone  Chronic combined systolic and diastolic CHF-stable -Plan to hold torsemide for now -Last 2D echocardiogram 11/2020 with LVEF 35-40%  Controlled type 2 diabetes -Patient has not taken insulin in the last several days due to poor oral intake -Hemoglobin A1c 5.7% -Continue SSI  CKD stage IV -Currently stable  Peripheral vascular disease with bilateral above-knee amputations  Stage III pressure ulcer -Wound care  Prolonged QTC -Likely related to electrolyte  abnormalities -repeat EKG in am -Amitriptyline held until further correction of electrolytes   DVT prophylaxis: Xarelto Code Status: Full Family Communication: None at bedside, patient will call Disposition Plan:  Status is: Inpatient  Remains inpatient appropriate because:Persistent severe electrolyte disturbances and IV treatments appropriate due to intensity of illness or inability to take PO   Dispo: The patient is from: Home              Anticipated d/c is to: Home              Patient currently is not medically stable to d/c.   Difficult to place patient No   Skin Assessment:  I have examined the patient's skin and I agree with the wound assessment as performed by the wound care RN as outlined below:  Pressure Injury Stage III -  Full thickness tissue loss. Subcutaneous fat may be visible but bone, tendon or muscle are NOT exposed. (Active)     Location: Ischial tuberosity  Location Orientation: Right  Staging: Stage III -  Full thickness tissue loss. Subcutaneous fat may be visible but bone, tendon or muscle are NOT exposed.  Wound Description (Comments):   Present on Admission: Yes    Consultants:   None  Procedures:   See below  Antimicrobials:   None   Subjective: Patient seen and evaluated today with ongoing loose stools noted.  She states that she is lactose intolerant, but continues to eat dairy products.  Objective: Vitals:   03/09/21 0851 03/09/21 1402 03/09/21 2100 03/10/21 0506  BP: (!) 143/83 (!) 88/60 136/70 118/60  Pulse: 76 64 67 63  Resp:  18 18 18   Temp:  98.1 F (36.7 C) 97.8 F (36.6 C) 97.7 F (36.5 C)  TempSrc:  Oral Oral   SpO2:  96%  98% 99%  Weight:        Intake/Output Summary (Last 24 hours) at 03/10/2021 1111 Last data filed at 03/10/2021 0700 Gross per 24 hour  Intake 1726.33 ml  Output --  Net 1726.33 ml   Filed Weights   03/08/21 1026  Weight: 101.2 kg    Examination:  General exam: Appears calm and  comfortable  Respiratory system: Clear to auscultation. Respiratory effort normal. Cardiovascular system: S1 & S2 heard, RRR.  Gastrointestinal system: Abdomen is soft Central nervous system: Alert and awake Extremities: B AKA Skin: No significant lesions noted Psychiatry: Flat affect.    Data Reviewed: I have personally reviewed following labs and imaging studies  CBC: Recent Labs  Lab 03/08/21 1055 03/10/21 0415  WBC 6.6 8.4  NEUTROABS 5.1  --   HGB 11.6* 9.9*  HCT 37.7 32.0*  MCV 82.5 82.5  PLT 120* 741*   Basic Metabolic Panel: Recent Labs  Lab 03/08/21 1055 03/09/21 0557 03/10/21 0415  NA 138 137 136  K 2.9* 2.1* 3.1*  CL 99 99 101  CO2 28 27 27   GLUCOSE 83 107* 147*  BUN 7 7 11   CREATININE 1.21* 1.11* 1.37*  CALCIUM 5.5* 5.9* 6.3*  MG 0.8* 1.2* 1.4*   GFR: Estimated Creatinine Clearance: 56 mL/min (A) (by C-G formula based on SCr of 1.37 mg/dL (H)). Liver Function Tests: Recent Labs  Lab 03/08/21 1548  AST 12*  ALT 7  ALKPHOS 95  BILITOT 0.9  PROT 6.5  ALBUMIN 2.7*   No results for input(s): LIPASE, AMYLASE in the last 168 hours. No results for input(s): AMMONIA in the last 168 hours. Coagulation Profile: No results for input(s): INR, PROTIME in the last 168 hours. Cardiac Enzymes: No results for input(s): CKTOTAL, CKMB, CKMBINDEX, TROPONINI in the last 168 hours. BNP (last 3 results) No results for input(s): PROBNP in the last 8760 hours. HbA1C: Recent Labs    03/09/21 0557  HGBA1C 5.6   CBG: Recent Labs  Lab 03/09/21 1619 03/09/21 2115 03/10/21 0739  GLUCAP 206* 116* 156*   Lipid Profile: No results for input(s): CHOL, HDL, LDLCALC, TRIG, CHOLHDL, LDLDIRECT in the last 72 hours. Thyroid Function Tests: No results for input(s): TSH, T4TOTAL, FREET4, T3FREE, THYROIDAB in the last 72 hours. Anemia Panel: No results for input(s): VITAMINB12, FOLATE, FERRITIN, TIBC, IRON, RETICCTPCT in the last 72 hours. Sepsis Labs: No results for  input(s): PROCALCITON, LATICACIDVEN in the last 168 hours.  Recent Results (from the past 240 hour(s))  SARS CORONAVIRUS 2 (TAT 6-24 HRS) Nasopharyngeal Nasopharyngeal Swab     Status: None   Collection Time: 03/08/21  1:49 PM   Specimen: Nasopharyngeal Swab  Result Value Ref Range Status   SARS Coronavirus 2 NEGATIVE NEGATIVE Final    Comment: (NOTE) SARS-CoV-2 target nucleic acids are NOT DETECTED.  The SARS-CoV-2 RNA is generally detectable in upper and lower respiratory specimens during the acute phase of infection. Negative results do not preclude SARS-CoV-2 infection, do not rule out co-infections with other pathogens, and should not be used as the sole basis for treatment or other patient management decisions. Negative results must be combined with clinical observations, patient history, and epidemiological information. The expected result is Negative.  Fact Sheet for Patients: SugarRoll.be  Fact Sheet for Healthcare Providers: https://www.woods-mathews.com/  This test is not yet approved or cleared by the [place] FDA and  has been authorized for detection and/or diagnosis of SARS-CoV-2 by FDA under an Emergency Use Authorization (EUA). This EUA will  remain  in effect (meaning this test can be used) for the duration of the COVID-19 declaration under Se ction 564(b)(1) of the Act, 21 U.S.C. section 360bbb-3(b)(1), unless the authorization is terminated or revoked sooner.  Performed at [facility], [place] [address], [place], [place] [address]   C Difficile Quick Screen w PCR reflex     Status: None   Collection Time: 03/08/21  9:07 PM   Specimen: STOOL  Result Value Ref Range Status   C Diff antigen NEGATIVE NEGATIVE Final   C Diff toxin NEGATIVE NEGATIVE Final   C Diff interpretation No C. difficile detected.  Final    Comment: Performed at [facility], [address], [place], [place] [address]  Gastrointestinal  Panel by PCR , Stool     Status: None   Collection Time: 03/08/21  9:07 PM   Specimen: STOOL  Result Value Ref Range Status   Campylobacter species NOT DETECTED NOT DETECTED Final   Plesimonas shigelloides NOT DETECTED NOT DETECTED Final   Salmonella species NOT DETECTED NOT DETECTED Final   Yersinia enterocolitica NOT DETECTED NOT DETECTED Final   Vibrio species NOT DETECTED NOT DETECTED Final   Vibrio cholerae NOT DETECTED NOT DETECTED Final   Enteroaggregative E coli (EAEC) NOT DETECTED NOT DETECTED Final   Enteropathogenic E coli (EPEC) NOT DETECTED NOT DETECTED Final   Enterotoxigenic E coli (ETEC) NOT DETECTED NOT DETECTED Final   Shiga like toxin producing E coli (STEC) NOT DETECTED NOT DETECTED Final   Shigella/Enteroinvasive E coli (EIEC) NOT DETECTED NOT DETECTED Final   Cryptosporidium NOT DETECTED NOT DETECTED Final   Cyclospora cayetanensis NOT DETECTED NOT DETECTED Final   Entamoeba histolytica NOT DETECTED NOT DETECTED Final   Giardia lamblia NOT DETECTED NOT DETECTED Final   Adenovirus F40/41 NOT DETECTED NOT DETECTED Final   Astrovirus NOT DETECTED NOT DETECTED Final   Norovirus GI/GII NOT DETECTED NOT DETECTED Final   Rotavirus A NOT DETECTED NOT DETECTED Final   Sapovirus (I, II, IV, and V) NOT DETECTED NOT DETECTED Final    Comment: Performed at [facility], [address], [place], [place] [address]         Radiology Studies: No results found.      Scheduled Meds: . amiodarone  200 mg Oral Daily  . insulin aspart  0-5 Units Subcutaneous QHS  . insulin aspart  0-6 Units Subcutaneous TID WC  . loperamide  4 mg Oral BID  . melatonin  6 mg Oral Once  . metoprolol tartrate  12.5 mg Oral BID  . pantoprazole  40 mg Oral Daily  . potassium chloride  40 mEq Oral BID  . rivaroxaban  20 mg Oral Q supper  . rosuvastatin  20 mg Oral q1800  . sodium bicarbonate  650 mg Oral BID   Continuous Infusions: . lactated ringers 100 mL/hr at 03/10/21 0718      LOS: 1 day    Time spent: 35 minutes    Paytyn Mesta Darleen Crocker, DO [name]  If 7PM-7AM, please contact night-coverage [email or web address] 03/10/2021, 11:11 AM

## 2021-03-11 DIAGNOSIS — E878 Other disorders of electrolyte and fluid balance, not elsewhere classified: Secondary | ICD-10-CM | POA: Diagnosis not present

## 2021-03-11 LAB — CBC
HCT: 32.5 % — ABNORMAL LOW (ref 36.0–46.0)
Hemoglobin: 9.8 g/dL — ABNORMAL LOW (ref 12.0–15.0)
MCH: 25.5 pg — ABNORMAL LOW (ref 26.0–34.0)
MCHC: 30.2 g/dL (ref 30.0–36.0)
MCV: 84.6 fL (ref 80.0–100.0)
Platelets: 113 10*3/uL — ABNORMAL LOW (ref 150–400)
RBC: 3.84 MIL/uL — ABNORMAL LOW (ref 3.87–5.11)
RDW: 19 % — ABNORMAL HIGH (ref 11.5–15.5)
WBC: 7.5 10*3/uL (ref 4.0–10.5)
nRBC: 0 % (ref 0.0–0.2)

## 2021-03-11 LAB — BASIC METABOLIC PANEL
Anion gap: 9 (ref 5–15)
BUN: 14 mg/dL (ref 6–20)
CO2: 25 mmol/L (ref 22–32)
Calcium: 6.8 mg/dL — ABNORMAL LOW (ref 8.9–10.3)
Chloride: 101 mmol/L (ref 98–111)
Creatinine, Ser: 1.4 mg/dL — ABNORMAL HIGH (ref 0.44–1.00)
GFR, Estimated: 44 mL/min — ABNORMAL LOW (ref >60)
Glucose, Bld: 119 mg/dL — ABNORMAL HIGH (ref 70–99)
Potassium: 3.4 mmol/L — ABNORMAL LOW (ref 3.5–5.1)
Sodium: 135 mmol/L (ref 135–145)

## 2021-03-11 LAB — GLUCOSE, CAPILLARY
Glucose-Capillary: 120 mg/dL — ABNORMAL HIGH (ref 70–99)
Glucose-Capillary: 128 mg/dL — ABNORMAL HIGH (ref 70–99)
Glucose-Capillary: 83 mg/dL (ref 70–99)
Glucose-Capillary: 103 mg/dL — ABNORMAL HIGH (ref 70–99)

## 2021-03-11 LAB — TSH: TSH: 2.889 u[IU]/mL (ref 0.350–4.500)

## 2021-03-11 LAB — MAGNESIUM: Magnesium: 1.8 mg/dL (ref 1.7–2.4)

## 2021-03-11 MED ORDER — AMITRIPTYLINE HCL 25 MG PO TABS
25.0000 mg | ORAL_TABLET | Freq: Every day | ORAL | Status: DC
  Administered 2021-03-11 – 2021-03-15 (×5): 25 mg via ORAL
  Filled 2021-03-11 (×5): qty 1

## 2021-03-11 MED ORDER — CALCIUM GLUCONATE-NACL 1-0.675 GM/50ML-% IV SOLN
1.0000 g | Freq: Once | INTRAVENOUS | Status: AC
  Administered 2021-03-11: 1000 mg via INTRAVENOUS
  Filled 2021-03-11: qty 50

## 2021-03-11 MED ORDER — ENOXAPARIN SODIUM 100 MG/ML ~~LOC~~ SOLN
100.0000 mg | Freq: Two times a day (BID) | SUBCUTANEOUS | Status: AC
  Administered 2021-03-11 – 2021-03-12 (×3): 100 mg via SUBCUTANEOUS
  Filled 2021-03-11 (×3): qty 1

## 2021-03-11 MED ORDER — ZOLPIDEM TARTRATE 5 MG PO TABS
5.0000 mg | ORAL_TABLET | Freq: Once | ORAL | Status: AC
  Administered 2021-03-11: 5 mg via ORAL
  Filled 2021-03-11: qty 1

## 2021-03-11 MED ORDER — DICYCLOMINE HCL 10 MG PO CAPS: 10.0000 mg | ORAL_CAPSULE | Freq: Four times a day (QID) | ORAL | Status: DC | PRN

## 2021-03-11 MED ORDER — LACTATED RINGERS IV SOLN: INTRAVENOUS | Status: DC

## 2021-03-11 NOTE — Progress Notes (Signed)
ANTICOAGULATION CONSULT NOTE - Initial Consult  Pharmacy Consult for Lovenox Indication: atrial fibrillation  Allergies  Allergen Reactions  . Lactose Intolerance (Gi) Diarrhea    Patient Measurements: Weight: 101.2 kg (223 lb)   Vital Signs: Temp: 98 F (36.7 C) (04/24 0655) Temp Source: Oral (04/24 0655) BP: 136/68 (04/24 0655) Pulse Rate: 62 (04/24 0655)  Labs: Recent Labs    03/09/21 0557 03/10/21 0415 03/11/21 0415  HGB  --  9.9* 9.8*  HCT  --  32.0* 32.5*  PLT  --  112* 113*  CREATININE 1.11* 1.37* 1.40*    Estimated Creatinine Clearance: 54.8 mL/min (A) (by C-G formula based on SCr of 1.4 mg/dL (H)).   Medical History: Past Medical History:  Diagnosis Date  . Abnormal stress test    a. 02/2017 MV: large region of fixed perfusion defect in basal to mid inf, mid-dist inflat walls, EF 43%. No ischemia (EF 55-60% by f/u echo).  . Arthritis   . Asthma   . Carotid arterial disease ([place])    a. 09/2017 Carotid U/S: 40-49% bilat ICA stenosis.  . Chronic back pain   . Coronary artery calcification seen on CT scan    a. 11/2017 CT Abd/Pelvis: Multi vessel coronary vascular Ca2+.  . Depression   . Diabetes mellitus   . Diabetic neuropathy ([place])   . Difficult intubation    DIFFICULT AIRWAY/FYI  . Family history of adverse reaction to anesthesia    mother had difficlty waking   . 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 GERD (gastroesophageal reflux disease)   . History of echocardiogram    a. 03/2017 Echo: EF 55-60%, mild LVH, nl RV fxn.  . Hyperlipidemia   . Ischemic cardiomyopathy    a. 04/2018 Echo: EF 30-35%, Gr2 DD, mild LVH. Mild MR. Mildly dil LA, mod dil RV w/ mod red RV fxn, mild TR, PASP 40mmHg.  . NSTEMI (non-ST elevated myocardial infarction) ([place])    a. 05/2018 in setting of Afib, sepsis, and post-op L AKA. Peak trop 7.7. EF 30-35% by echo-->cath not performed 2/2 renal failure.  . 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 PAD (peripheral artery disease) ([place])    a. S/p L fem-pop bypass; b. 11/2017 s/p Aortobifem bypass 2/2 graft occlusion; c. 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  . Paroxysmal atrial fibrillation with rapid ventricular response ([place]) 12/02/2017   a. CHA2DS2VASc = 3-->Xarelto; b. 05/2018 Recurrent Afib-->amio.  Marland Kitchen Renal insufficiency   . Ulcer    Foot    Medications:  Medications Prior to Admission  Medication Sig Dispense Refill Last Dose  . albuterol (PROVENTIL) 2 MG tablet Take 1 tablet (2 mg total) by mouth daily as needed for shortness of breath. 30 tablet 0   . amiodarone (PACERONE) 200 MG tablet Take 1 tablet (200 mg total) by mouth See admin instructions. oral amiodarone 400mg  bid x 7 days, then 200mg  bid x 2 weeks, then 200mg  daily after that 90 tablet 0 03/07/2021 at Unknown time  . amitriptyline (ELAVIL) 25 MG tablet Take 1 tablet (25 mg total) by mouth at bedtime. 30 tablet 0 03/07/2021 at Unknown time  . Balsam [place]-Castor Oil (VENELEX) OINT Apply 1 application topically 3 (three) times daily. Every shift to bilateral buttocks, coccyx, and sacrum     . dicyclomine (BENTYL) 10 MG capsule Take 1 capsule (10 mg total) by mouth every 6 (six) hours as needed for spasms. 30 capsule 0   .  insulin glargine (LANTUS) 100 UNIT/ML injection Inject 0.18 mLs (18 Units total) into the skin 2 (two) times daily. 10 mL 0 03/07/2021 at Unknown time  . insulin lispro (HUMALOG KWIKPEN) 100 UNIT/ML KwikPen Inject 4-10 Units into the skin 3 (three) times daily with meals. If blood sugar 121-150, 2 units If blood sugar 151-200, 4 units If blood sugar 201-250, 6 units If blood sugar 251-300, 8 units If blood sugar 301-500, 10 units If blood sugar greater than 350 call MD 15 mL 0 03/07/2021 at Unknown time  . metoprolol tartrate (LOPRESSOR) 25 MG tablet Take 0.5 tablets (12.5 mg total) by mouth 2 (two) times daily. 30 tablet 0 03/07/2021 at 2100  . naloxone [facility]) nasal spray 4  mg/0.1 mL Place 1 spray into the nose once as needed (opioid overdose). 1 each 0   . nicotine (NICODERM CQ - DOSED IN MG/24 HOURS) 21 mg/24hr patch Place 21 mg onto the skin daily. Rotate sites and remove old patch prior to application     . omeprazole (PRILOSEC) 40 MG capsule Take 1 capsule (40 mg total) by mouth daily. 30 capsule 0 03/07/2021 at Unknown time  . Potassium Chloride ER 20 MEQ TBCR Take 20 mEq by mouth daily. 1 tab daily by mouth 30 tablet 0 03/07/2021 at Unknown time  . Rivaroxaban (XARELTO) 15 MG TABS tablet Take 1 tablet (15 mg total) by mouth daily with supper. 30 tablet 0 03/07/2021 at 1600  . rosuvastatin (CRESTOR) 20 MG tablet Take 1 tablet (20 mg total) by mouth daily at 6 PM. 30 tablet 0 03/07/2021 at Unknown time  . sodium bicarbonate 650 MG tablet Take 1 tablet (650 mg total) by mouth 2 (two) times daily. 60 tablet 0 03/07/2021 at Unknown time  . torsemide (DEMADEX) 20 MG tablet Take 1 tablet (20 mg total) by mouth daily. 30 tablet 0 03/07/2021 at Unknown time  . vitamin B-12 1000 MCG tablet Take 1 tablet (1,000 mcg total) by mouth daily. 30 tablet 0 03/07/2021 at Unknown time  . LYRICA 50 MG capsule Take 1 capsule (50 mg total) by mouth 2 (two) times daily. (Patient not taking: No sig reported) 60 capsule 0 Not Taking at Unknown time    Assessment: 55 y.o. female with a past medical history of atrial fibrillation, systolic and diastolic CHF, diabetes, CKD 4, who was admitted to [facility] for severe electrolyte abnormalities in the setting of chronic diarrhea.   Goal of Therapy:  Anti-Xa level 0.6-1 units/ml 4hrs after LMWH dose given Monitor platelets by anticoagulation protocol: Yes   Plan:  Lovenox 1mg /Kg SQ q12hrs (100mg ) Monitor CBC, s/sx bleeding complications  Hart Robinsons A 03/11/2021,11:55 AM

## 2021-03-11 NOTE — Consult Note (Signed)
Consulting  Provider: Dr. Manuella Ghazi Primary Care Physician:  Frazier Richards, MD Primary Gastroenterologist:  None  Reason for Consultation:  Diarrhea  HPI:  [name] is a 55 y.o. female with a past medical history of atrial fibrillation, systolic and diastolic CHF, diabetes, CKD 4, who was admitted to [facility] for severe electrolyte abnormalities in the setting of chronic diarrhea.    Patient states her diarrhea has been going on for years though has recently gotten worse in last 4 to 5 weeks.  Notes at least 2-3 watery bowel movements daily.  No melena hematochezia.  Also has nausea with frequent vomiting.  No overt dysphagia odynophagia.  No chronic NSAID use.  She is C. difficile negative.  No recent antibiotic use.   Does note some left lower quadrant abdominal pain as well which is intermittent in nature, does not radiate.  She does note lactose intolerance and has not been avoiding dairy as of late.  Unclear about gluten.  States she had a colonoscopy "many years ago."  Takes omeprazole daily for chronic reflux.    Denies any new medications.  No recent travel.  No recent picnics or camping.  Drinks alcohol on social occasions like Christmas.  Initially found to be hypokalemic, hypocalcemic, hypomagnesemic which have been repleted since admission.  Past Medical History:  Diagnosis Date  . Abnormal stress test    a. 02/2017 MV: large region of fixed perfusion defect in basal to mid inf, mid-dist inflat walls, EF 43%. No ischemia (EF 55-60% by f/u echo).  . Arthritis   . Asthma   . Carotid arterial disease ([place])    a. 09/2017 Carotid U/S: 40-49% bilat ICA stenosis.  . Chronic back pain   . Coronary artery calcification seen on CT scan    a. 11/2017 CT Abd/Pelvis: Multi vessel coronary vascular Ca2+.  . Depression   . Diabetes mellitus   . Diabetic neuropathy ([place])   . Difficult intubation    DIFFICULT AIRWAY/FYI  . Family history of adverse reaction to anesthesia     mother had difficlty waking   . Femoral-popliteal bypass graft occlusion, left ([place]) 12/02/2017  . GERD (gastroesophageal reflux disease)   . History of echocardiogram    a. 03/2017 Echo: EF 55-60%, mild LVH, nl RV fxn.  . Hyperlipidemia   . Ischemic cardiomyopathy    a. 04/2018 Echo: EF 30-35%, Gr2 DD, mild LVH. Mild MR. Mildly dil LA, mod dil RV w/ mod red RV fxn, mild TR, PASP 42mmHg.  . NSTEMI (non-ST elevated myocardial infarction) ([place])    a. 05/2018 in setting of Afib, sepsis, and post-op L AKA. Peak trop 7.7. EF 30-35% by echo-->cath not performed 2/2 renal failure.  . 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 PAD (peripheral artery disease) ([place])    a. S/p L fem-pop bypass; b. 11/2017 s/p Aortobifem bypass 2/2 graft occlusion; c. 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  . Paroxysmal atrial fibrillation with rapid ventricular response ([place]) 12/02/2017   a. CHA2DS2VASc = 3-->Xarelto; b. 05/2018 Recurrent Afib-->amio.  Marland Kitchen Renal insufficiency   . Ulcer    Foot    Past Surgical History:  Procedure Laterality Date  . ABDOMINAL AORTAGRAM  June 15, 2014  . ABDOMINAL AORTAGRAM N/A 06/15/2014   Procedure: ABDOMINAL Maxcine Ham;  Surgeon: Serafina Mitchell, MD;  [facility] CATH LAB;  Service: Cardiovascular;  Laterality: N/A;  . ABDOMINAL AORTAGRAM N/A 11/22/2014   Procedure: ABDOMINAL AORTAGRAM;  Surgeon: Serafina Mitchell, MD;  Location: [facility] CATH LAB;  Service: Cardiovascular;  Laterality: N/A;  . ABDOMINAL AORTOGRAM W/LOWER EXTREMITY N/A 01/07/2017   Procedure: Abdominal Aortogram w/Lower Extremity;  Surgeon: Serafina Mitchell, MD;  Location: [place] CV LAB;  Service: Cardiovascular;  Laterality: N/A;  . ABDOMINAL AORTOGRAM W/LOWER EXTREMITY N/A 10/31/2017   Procedure: ABDOMINAL AORTOGRAM W/LOWER EXTREMITY;  Surgeon: Elam Dutch, MD;  Location: [place] CV LAB;  Service: Cardiovascular;  Laterality: N/A;  . ABDOMINAL AORTOGRAM W/LOWER EXTREMITY  N/A 03/24/2018   Procedure: ABDOMINAL AORTOGRAM W/LOWER EXTREMITY;  Surgeon: Serafina Mitchell, MD;  Location: [place] CV LAB;  Service: Cardiovascular;  Laterality: N/A;  . AMPUTATION Left 04/26/2018   Procedure: AMPUTATION ABOVE KNEE;  Surgeon: Elam Dutch, MD;  Location: [place];  Service: Vascular;  Laterality: Left;  . AORTA - BILATERAL FEMORAL ARTERY BYPASS GRAFT N/A 11/28/2017   Procedure: AORTA BIFEMORAL BYPASS USING HEMASHIELD GOLD GRAFT & REIMPLANT IMA;  Surgeon: Serafina Mitchell, MD;  Location: [facility] OR;  Service: Vascular;  Laterality: N/A;  . AORTIC ARCH ANGIOGRAPHY N/A 10/31/2017   Procedure: AORTIC ARCH ANGIOGRAPHY;  Surgeon: Elam Dutch, MD;  Location: [place] CV LAB;  Service: Cardiovascular;  Laterality: N/A;  . APPLICATION OF WOUND VAC  11/28/2017   Procedure: APPLICATION OF WOUND VAC;  Surgeon: Serafina Mitchell, MD;  Location: [facility] OR;  Service: Vascular;;  . APPLICATION OF WOUND VAC Left 03/27/2018   Procedure: APPLICATION OF WOUND VAC LEFT GROIN USING PREVENA PLUS;  Surgeon: Serafina Mitchell, MD;  Location: [place];  Service: Vascular;  Laterality: Left;  . ARTERIAL BYPASS SURGERY   07/05/2010   Right Common Femoral to below knee popliteal BPG  . BACK SURGERY     X's  2  . CARDIAC CATHETERIZATION    . CHOLECYSTECTOMY     Gall Bladder  . CYSTECTOMY Left    wrist  . EMBOLECTOMY Left 11/28/2017   Procedure: Left Lower Extremity Embolectomy, Left Tibial Peroneal Trunk Endarterectomy with Patch Angioplasty; Vein Harvest Small Saphenous Graft Left Lower Leg;  Surgeon: [name], MD;  Location: [place];  Service: Vascular;  Laterality: Left;  . EYE SURGERY Left 02/23/2020   Dr. Zadie Rhine  . FEMORAL-POPLITEAL BYPASS GRAFT Left 03/27/2018   Procedure: THROMBECTOMY OF LEFT FEMORAL TIBIAL BYPASS;  Surgeon: Serafina Mitchell, MD;  Location: [place];  Service: Vascular;  Laterality: Left;  . FEMORAL-TIBIAL BYPASS GRAFT Left 03/27/2018   Procedure: BYPASS GRAFT  FEMORAL-TIBIAL ARTERY LEFT REDO USING CRYOPRESERVED SAPHENOUS VEIN 70cm;  Surgeon: Serafina Mitchell, MD;  Location: [facility] OR;  Service: Vascular;  Laterality: Left;  . INTERCOSTAL NERVE BLOCK  November 2015  . INTRAOPERATIVE ARTERIOGRAM  11/28/2017   Procedure: INTRA OPERATIVE ARTERIOGRAM OF LEFT LEG;  Surgeon: Serafina Mitchell, MD;  Location: [place];  Service: Vascular;;  . INTRAOPERATIVE ARTERIOGRAM Left 03/27/2018   Procedure: INTRA OPERATIVE ARTERIOGRAM TIMES TWO;  Surgeon: Serafina Mitchell, MD;  Location: [place];  Service: Vascular;  Laterality: Left;  . IR FLUORO GUIDE CV LINE RIGHT  03/20/2017  . IR FLUORO GUIDE CV LINE RIGHT  05/05/2018  . IR REMOVAL TUN CV CATH W/O FL  05/20/2018  . IR US GUIDE VASC ACCESS RIGHT  03/20/2017  . IR US GUIDE VASC ACCESS RIGHT  05/05/2018  . IRRIGATION AND DEBRIDEMENT BUTTOCKS Right 09/30/2016   Procedure: DEBRIDEMENT RIGHT  BUTTOCK WOUND;  Surgeon: Georganna Skeans, MD;  Location: [place];  Service: General;  Laterality: Right;  . left foot surgery    . PERIPHERAL VASCULAR CATHETERIZATION N/A 05/07/2016   Procedure: Abdominal Aortogram;  Surgeon: Serafina Mitchell, MD;  Location: [place] CV LAB;  Service: Cardiovascular;  Laterality: N/A;  . PERIPHERAL VASCULAR CATHETERIZATION N/A 05/07/2016   Procedure: Lower Extremity Angiography;  Surgeon: Serafina Mitchell, MD;  Location: [place] CV LAB;  Service: Cardiovascular;  Laterality: N/A;  . PERIPHERAL VASCULAR CATHETERIZATION N/A 05/07/2016   Procedure: Aortic Arch Angiography;  Surgeon: Serafina Mitchell, MD;  Location: [place] CV LAB;  Service: Cardiovascular;  Laterality: N/A;  . PERIPHERAL VASCULAR CATHETERIZATION N/A 05/07/2016   Procedure: Upper Extremity Angiography;  Surgeon: Serafina Mitchell, MD;  Location: [place] CV LAB;  Service: Cardiovascular;  Laterality: N/A;  . PERIPHERAL VASCULAR CATHETERIZATION Right 05/07/2016   Procedure: Peripheral Vascular Balloon Angioplasty;  Surgeon: Serafina Mitchell, MD;   Location: [place] CV LAB;  Service: Cardiovascular;  Laterality: Right;  subclavian  . PERIPHERAL VASCULAR CATHETERIZATION Right 05/07/2016   Procedure: Peripheral Vascular Intervention;  Surgeon: Serafina Mitchell, MD;  Location: [place] CV LAB;  Service: Cardiovascular;  Laterality: Right;  External  Iliac  . SKIN GRAFT Right 2012   [name] by Dr. Nils Pyle- Right and Left Ankle  . SPINE SURGERY    . THROMBECTOMY FEMORAL ARTERY  11/28/2017   Procedure: THROMBECTOMY  & REVISION OF BILATERAL FEMORAL TO POPLETEAL ARTERIES;  Surgeon: Serafina Mitchell, MD;  Location: [place];  Service: Vascular;;  . TONSILLECTOMY    . TRANSMETATARSAL AMPUTATION Right 10/07/2020   Procedure: RIGHT BELOW KNEE AMPUTATION;  Surgeon: Erle Crocker, MD;  Location: [place];  Service: Orthopedics;  Laterality: Right;    Prior to Admission medications   Medication Sig Start Date End Date Taking? Authorizing Provider  albuterol (PROVENTIL) 2 MG tablet Take 1 tablet (2 mg total) by mouth daily as needed for shortness of breath. 12/05/20  Yes Gerlene Fee, NP  amiodarone (PACERONE) 200 MG tablet Take 1 tablet (200 mg total) by mouth See admin instructions. oral amiodarone 400mg  bid x 7 days, then 200mg  bid x 2 weeks, then 200mg  daily after that 12/05/20  Yes Gerlene Fee, NP  amitriptyline (ELAVIL) 25 MG tablet Take 1 tablet (25 mg total) by mouth at bedtime. 12/05/20  Yes Gerlene Fee, NP  Balsam [place]-Castor Oil [facility]) OINT Apply 1 application topically 3 (three) times daily. Every shift to bilateral buttocks, coccyx, and sacrum   Yes [provider]  dicyclomine (BENTYL) 10 MG capsule Take 1 capsule (10 mg total) by mouth every 6 (six) hours as needed for spasms. 12/05/20  Yes Gerlene Fee, NP  insulin glargine (LANTUS) 100 UNIT/ML injection Inject 0.18 mLs (18 Units total) into the skin 2 (two) times daily. 12/05/20  Yes Gerlene Fee, NP  insulin lispro (HUMALOG KWIKPEN) 100 UNIT/ML KwikPen  Inject 4-10 Units into the skin 3 (three) times daily with meals. If blood sugar 121-150, 2 units If blood sugar 151-200, 4 units If blood sugar 201-250, 6 units If blood sugar 251-300, 8 units If blood sugar 301-500, 10 units If blood sugar greater than 350 call MD 12/05/20  Yes Gerlene Fee, NP  metoprolol tartrate (LOPRESSOR) 25 MG tablet Take 0.5 tablets (12.5 mg total) by mouth 2 (two) times daily. 12/05/20  Yes Gerlene Fee, NP  naloxone [facility]) nasal spray 4 mg/0.1 mL Place 1 spray into the nose once as needed (opioid overdose). 12/05/20  Yes  Gerlene Fee, NP  nicotine (NICODERM CQ - DOSED IN MG/24 HOURS) 21 mg/24hr patch Place 21 mg onto the skin daily. Rotate sites and remove old patch prior to application   Yes [provider]  omeprazole (PRILOSEC) 40 MG capsule Take 1 capsule (40 mg total) by mouth daily. 12/05/20  Yes Gerlene Fee, NP  Potassium Chloride ER 20 MEQ TBCR Take 20 mEq by mouth daily. 1 tab daily by mouth 12/05/20  Yes Gerlene Fee, NP  Rivaroxaban (XARELTO) 15 MG TABS tablet Take 1 tablet (15 mg total) by mouth daily with supper. 12/05/20  Yes Gerlene Fee, NP  rosuvastatin (CRESTOR) 20 MG tablet Take 1 tablet (20 mg total) by mouth daily at 6 PM. 12/05/20  Yes Green, Phylis Bougie, NP  sodium bicarbonate 650 MG tablet Take 1 tablet (650 mg total) by mouth 2 (two) times daily. 12/05/20  Yes Gerlene Fee, NP  torsemide (DEMADEX) 20 MG tablet Take 1 tablet (20 mg total) by mouth daily. 12/05/20  Yes Gerlene Fee, NP  vitamin B-12 1000 MCG tablet Take 1 tablet (1,000 mcg total) by mouth daily. 10/12/20  Yes Pahwani, Rinka R, MD  LYRICA 50 MG capsule Take 1 capsule (50 mg total) by mouth 2 (two) times daily. Patient not taking: No sig reported 12/02/20   Yvonna Alanis, NP    Current Facility-Administered Medications  Medication Dose Route Frequency Provider Last Rate Last Admin  . acetaminophen (TYLENOL) tablet 650 mg  650 mg Oral Q6H PRN Emokpae,  Ejiroghene E, MD   650 mg at 03/10/21 2216   Or  . acetaminophen (TYLENOL) suppository 650 mg  650 mg Rectal Q6H PRN Emokpae, Ejiroghene E, MD      . amiodarone (PACERONE) tablet 200 mg  200 mg Oral Daily Emokpae, Ejiroghene E, MD   200 mg at 03/11/21 0924  . amitriptyline (ELAVIL) tablet 25 mg  25 mg Oral QHS Shah, Pratik D, DO      . calcium gluconate 1 g/ 50 mL sodium chloride IVPB  1 g Intravenous Once Manuella Ghazi, Pratik D, DO      . dicyclomine (BENTYL) capsule 10 mg  10 mg Oral Q6H PRN Manuella Ghazi, Pratik D, DO      . insulin aspart (novoLOG) injection 0-5 Units  0-5 Units Subcutaneous QHS Shah, Pratik D, DO      . insulin aspart (novoLOG) injection 0-6 Units  0-6 Units Subcutaneous TID WC Shah, Pratik D, DO   1 Units at 03/10/21 1718  . lactated ringers infusion   Intravenous Continuous Heath Lark D, DO 75 mL/hr at 03/11/21 0928 New Bag at 03/11/21 0928  . loperamide (IMODIUM) capsule 4 mg  4 mg Oral BID Manuella Ghazi, Pratik D, DO   4 mg at 03/11/21 0998  . melatonin tablet 6 mg  6 mg Oral Once Adefeso, Oladapo, DO      . metoprolol tartrate (LOPRESSOR) tablet 12.5 mg  12.5 mg Oral BID Manuella Ghazi, Pratik D, DO   12.5 mg at 03/11/21 3382  . pantoprazole (PROTONIX) EC tablet 40 mg  40 mg Oral Daily Emokpae, Ejiroghene E, MD   40 mg at 03/11/21 0924  . potassium chloride SA (KLOR-CON) CR tablet 40 mEq  40 mEq Oral BID Heath Lark D, DO   40 mEq at 03/11/21 5053  . promethazine (PHENERGAN) tablet 12.5 mg  12.5 mg Oral Q6H PRN Emokpae, Ejiroghene E, MD   12.5 mg at 03/10/21 2216  . rivaroxaban (XARELTO) tablet  20 mg  20 mg Oral Q supper Heath Lark D, DO   20 mg at 03/10/21 1717  . rosuvastatin (CRESTOR) tablet 20 mg  20 mg Oral q1800 Emokpae, Ejiroghene E, MD   20 mg at 03/10/21 1717  . simethicone (MYLICON) chewable tablet 80 mg  80 mg Oral Q6H PRN Adefeso, Oladapo, DO   80 mg at 03/10/21 2217  . sodium bicarbonate tablet 650 mg  650 mg Oral BID Emokpae, Ejiroghene E, MD   650 mg at 03/11/21 1749    Allergies as  of 03/08/2021 - Review Complete 03/08/2021  Allergen Reaction Noted  . Lactose intolerance (gi) Diarrhea 05/04/2018    Family History  Problem Relation Age of Onset  . Coronary artery disease Mother   . Peripheral vascular disease Mother   . Heart disease Mother        Before age 48  . Other Mother        Venous insuffiency  . Diabetes Mother   . Hyperlipidemia Mother   . Hypertension Mother   . Varicose Veins Mother   . Heart attack Mother        before age 53  . Heart disease Father   . Diabetes Father   . Diabetes Maternal Grandmother   . Diabetes Paternal Grandmother   . Diabetes Paternal Grandfather   . Diabetes Sister   . Hypertension Sister   . Diabetes Brother   . Hypertension Brother     Social History   Socioeconomic History  . Marital status: Married    Spouse name: Not on file  . Number of children: Not on file  . Years of education: Not on file  . Highest education level: Not on file  Occupational History  . Occupation: disabled  Tobacco Use  . Smoking status: Current Some Day Smoker    Packs/day: 1.00    Years: 30.00    Pack years: 30.00    Types: Cigarettes    Last attempt to quit: 02/07/2017    Years since quitting: 4.0  . Smokeless tobacco: Never Used  Vaping Use  . Vaping Use: Never used  Substance and Sexual Activity  . Alcohol use: No    Alcohol/week: 0.0 standard drinks  . Drug use: No  . Sexual activity: Yes    Birth control/protection: Post-menopausal  Other Topics Concern  . Not on file  Social History Narrative  . Not on file   Social Determinants of Health   Financial Resource Strain: Not on file  Food Insecurity: Not on file  Transportation Needs: Not on file  Physical Activity: Not on file  Stress: Not on file  Social Connections: Not on file  Intimate Partner Violence: Not on file    Review of Systems: General: Negative for anorexia, weight loss, fever, chills, fatigue, weakness. Eyes: Negative for vision changes.   ENT: Negative for hoarseness, difficulty swallowing , nasal congestion. CV: Negative for chest pain, angina, palpitations, dyspnea on exertion, peripheral edema.  Respiratory: Negative for dyspnea at rest, dyspnea on exertion, cough, sputum, wheezing.  GI: See history of present illness. GU:  Negative for dysuria, hematuria, urinary incontinence, urinary frequency, nocturnal urination.  MS: Negative for joint pain, low back pain.  Derm: Negative for rash or itching.  Neuro: Negative for weakness, abnormal sensation, seizure, frequent headaches, memory loss, confusion.  Psych: Negative for anxiety, depression Endo: Negative for unusual weight change.  Heme: Negative for bruising or bleeding. Allergy: Negative for rash or hives.  Physical Exam: Vital  signs in last 24 hours: Temp:  [98 F (36.7 C)-98.1 F (36.7 C)] 98 F (36.7 C) (04/24 0655) Pulse Rate:  [62-67] 62 (04/24 0655) Resp:  [18-19] 19 (04/24 0655) BP: (116-136)/(63-72) 136/68 (04/24 0655) SpO2:  [86 %-100 %] 99 % (04/24 0655) Last BM Date: 03/11/21 General:   Alert,  Well-developed, well-nourished, pleasant and cooperative in NAD Head:  Normocephalic and atraumatic. Eyes:  Sclera clear, no icterus.   Conjunctiva pink. Ears:  Normal auditory acuity. Nose:  No deformity, discharge,  or lesions. Mouth:  No deformity or lesions, dentition normal. Neck:  Supple; no masses or thyromegaly. Lungs:  Clear throughout to auscultation.   No wheezes, crackles, or rhonchi. No acute distress. Heart:  Regular rate and rhythm; no murmurs, clicks, rubs,  or gallops. Abdomen:  Soft, nontender and nondistended. No masses, hepatosplenomegaly or hernias noted. Normal bowel sounds, without guarding, and without rebound.   Rectal:  Deferred until time of colonoscopy.   Msk: Status post bilateral lower extremity amputations Pulses:  Normal pulses noted. Extremities:  Without clubbing or edema. Neurologic:  Alert and  oriented x4;  grossly  normal neurologically. Skin:  Intact without significant lesions or rashes. Cervical Nodes:  No significant cervical adenopathy. Psych:  Alert and cooperative. Normal mood and affect.  Intake/Output from previous day: 04/23 0701 - 04/24 0700 In: 480 [P.O.:480] Out: 2 [Urine:1; Emesis/NG output:1] Intake/Output this shift: Total I/O In: 240 [P.O.:240] Out: -   Lab Results: Recent Labs    03/10/21 0415 03/11/21 0415  WBC 8.4 7.5  HGB 9.9* 9.8*  HCT 32.0* 32.5*  PLT 112* 113*   BMET Recent Labs    03/09/21 0557 03/10/21 0415 03/11/21 0415  NA 137 136 135  K 2.1* 3.1* 3.4*  CL 99 101 101  CO2 27 27 25   GLUCOSE 107* 147* 119*  BUN 7 11 14   CREATININE 1.11* 1.37* 1.40*  CALCIUM 5.9* 6.3* 6.8*   LFT Recent Labs    03/08/21 1548  PROT 6.5  ALBUMIN 2.7*  AST 12*  ALT 7  ALKPHOS 95  BILITOT 0.9  BILIDIR 0.2  IBILI 0.7   PT/INR No results for input(s): LABPROT, INR in the last 72 hours. Hepatitis Panel No results for input(s): HEPBSAG, HCVAB, HEPAIGM, HEPBIGM in the last 72 hours. C-Diff Recent Labs    03/08/21 2107  CDIFFTOX NEGATIVE    Studies/Results: No results found.  Impression: *Diarrhea-etiology unclear *Hypokalemia, hypocalcemia, hypomagnesemia due to above *Left lower quadrant abdominal pain *Nausea and vomiting  Plan: Etiology of patient's chronic diarrhea unclear.  C. difficile is negative.  We will need to check formal GI panel PCR if this has not been done.  She is on a few medications including PPI and statin which put her at risk for microscopic colitis.  Discussed potential work-up including colonoscopy with random biopsies if she would like to proceed.  We can also perform an EGD at the same time to further evaluate her nausea and vomiting.  We can also rule out H. pylori which can cause chronic diarrhea as well.  We will hold her Xarelto in anticipation of EGD/colonoscopy on Tuesday.  We will place patient on clear liquids.  I  will check celiac panel and TSH.  Agree with Imodium 4 times daily as needed.  Thank you for letting us take part in this patient's care.  GI to continue to follow.  Elon Alas. Abbey Chatters, D.O. Gastroenterology and Hepatology [facility]    LOS: 2 days  03/11/2021, 11:13 AM

## 2021-03-11 NOTE — Progress Notes (Addendum)
PROGRESS NOTE    [name]  [id number]:[id number] DOB: December 01, 1965 DOA: 03/08/2021 PCP: Frazier Richards, MD   Brief Narrative:   [name] a 55 y.o.femalewith medical history significant foratrial fibrillation, systolic and diastolic CHF, diabetes mellitus, hypertension,bilateral above-knee amputation,CKD 4, NSTEMI. Patient was sent to the ED by her primary care provider(clinic in [place])reports of low potassium. Patient has had diarrhea over the past 4 -5weeks.Patient was admitted for severe electrolyte abnormalities in the setting of her home diarrhea.  She continues to have persistent diarrhea for which GI will be consulted for evaluation.  Stool studies have returned negative thus far.  Assessment & Plan:   Principal Problem:   Electrolyte abnormality Active Problems:   Peripheral vascular disease (HCC)   Chronic combined systolic (congestive) and diastolic (congestive) heart failure (HCC)   CKD stage 4 due to type 2 diabetes mellitus (HCC)   S/P bilateral above knee amputation (HCC)   Hypokalemia   Severe electrolyte abnormalities -Noted to have hypokalemia, hypomagnesemia, and hypocalcemia in the setting of severe diarrhea -Continue to replete aggressively and reevaluate in a.m. -Monitor on telemetry -Check ionized calcium  Intractable diarrhea  -Likely related to lactose intolerance and pt has been consuming dairy -Clear liquid diet per GI -C. difficile study negative -GI panel negative -Started imodium 4mg  bid 4/23, but has not helped -Abdominal exam benign -Appreciate GI evaluation with plans for EGD/colonoscopy on 4/26 -Celiac panel and TSH per GI  Atrial fibrillation-currently in sinus rhythm -Continue home amiodarone -Hold Xarelto in anticipation of endoscopy 4/26 and placed on full dose Lovenox  Chronic combined systolic and diastolic CHF-stable -Plan to hold torsemide for now -Last 2D echocardiogram 11/2020 with LVEF  35-40%  Controlled type 2 diabetes -Patient has not taken insulin in the last several days due to poor oral intake -Hemoglobin A1c 5.7% -Continue SSI  CKD stage IIIb -Currently stable  Peripheral vascular disease with bilateral above-knee amputations  Stage III pressure ulcer -Wound care  Prolonged QTC -Likely related to electrolyte abnormalities -repeat EKG improved -Resume amitryptiline tonight   DVT prophylaxis:Xarelto to full dose Lovenox 4/24 Code Status:Full Family Communication:None at bedside, patient will call Disposition Plan: Status is: Inpatient  Remains inpatient appropriate because:Persistent severe electrolyte disturbances and IV treatments appropriate due to intensity of illness or inability to take PO   Dispo: The patient is from: Home  Anticipated d/c is to: Home  Patient currently is not medically stable to d/c.             Difficult to place patient No   Skin Assessment:  I have examined the patient's skin and I agree with the wound assessment as performed by the wound care RN as outlined below:  Pressure Injury Stage III - Full thickness tissue loss. Subcutaneous fat may be visible but bone, tendon or muscle are NOT exposed. (Active)    Location: Ischial tuberosity  Location Orientation: Right  Staging: Stage III - Full thickness tissue loss. Subcutaneous fat may be visible but bone, tendon or muscle are NOT exposed.  Wound Description (Comments):   Present on Admission: Yes    Consultants:  GI  Procedures:  See below  Antimicrobials:  None  Subjective: Patient seen and evaluated today with ongoing diarrhea with BM 2-3 times every hour. She claims that the Imodium has not helped her at all. She denies abdominal pain, or N/V.  Objective: Vitals:   03/10/21 0506 03/10/21 1530 03/10/21 2152 03/11/21 0655  BP: 118/60 116/72 131/63 136/68  Pulse:  63 62 67 62  Resp: 18 18 18 19    Temp: 97.7 F (36.5 C) 98.1 F (36.7 C) 98.1 F (36.7 C) 98 F (36.7 C)  TempSrc:  Oral Oral Oral  SpO2: 99% (!) 86% 100% 99%  Weight:        Intake/Output Summary (Last 24 hours) at 03/11/2021 1046 Last data filed at 03/11/2021 0900 Gross per 24 hour  Intake 720 ml  Output 2 ml  Net 718 ml   Filed Weights   03/08/21 1026  Weight: 101.2 kg    Examination:  General exam: Appears calm and comfortable  Respiratory system: Clear to auscultation. Respiratory effort normal. Cardiovascular system: S1 & S2 heard, RRR.  Gastrointestinal system: Abdomen is soft Central nervous system: Alert and awake Extremities: B AKA Skin: No significant lesions noted Psychiatry: Flat affect.    Data Reviewed: I have personally reviewed following labs and imaging studies  CBC: Recent Labs  Lab 03/08/21 1055 03/10/21 0415 03/11/21 0415  WBC 6.6 8.4 7.5  NEUTROABS 5.1  --   --   HGB 11.6* 9.9* 9.8*  HCT 37.7 32.0* 32.5*  MCV 82.5 82.5 84.6  PLT 120* 112* 357*   Basic Metabolic Panel: Recent Labs  Lab 03/08/21 1055 03/09/21 0557 03/10/21 0415 03/11/21 0415  NA 138 137 136 135  K 2.9* 2.1* 3.1* 3.4*  CL 99 99 101 101  CO2 28 27 27 25   GLUCOSE 83 107* 147* 119*  BUN 7 7 11 14   CREATININE 1.21* 1.11* 1.37* 1.40*  CALCIUM 5.5* 5.9* 6.3* 6.8*  MG 0.8* 1.2* 1.4* 1.8   GFR: Estimated Creatinine Clearance: 54.8 mL/min (A) (by C-G formula based on SCr of 1.4 mg/dL (H)). Liver Function Tests: Recent Labs  Lab 03/08/21 1548  AST 12*  ALT 7  ALKPHOS 95  BILITOT 0.9  PROT 6.5  ALBUMIN 2.7*   No results for input(s): LIPASE, AMYLASE in the last 168 hours. No results for input(s): AMMONIA in the last 168 hours. Coagulation Profile: No results for input(s): INR, PROTIME in the last 168 hours. Cardiac Enzymes: No results for input(s): CKTOTAL, CKMB, CKMBINDEX, TROPONINI in the last 168 hours. BNP (last 3 results) No results for input(s): PROBNP in the last 8760  hours. HbA1C: Recent Labs    03/09/21 0557  HGBA1C 5.6   CBG: Recent Labs  Lab 03/10/21 0739 03/10/21 1120 03/10/21 1632 03/10/21 2145 03/11/21 0815  GLUCAP 156* 138* 167* 112* 120*   Lipid Profile: No results for input(s): CHOL, HDL, LDLCALC, TRIG, CHOLHDL, LDLDIRECT in the last 72 hours. Thyroid Function Tests: No results for input(s): TSH, T4TOTAL, FREET4, T3FREE, THYROIDAB in the last 72 hours. Anemia Panel: No results for input(s): VITAMINB12, FOLATE, FERRITIN, TIBC, IRON, RETICCTPCT in the last 72 hours. Sepsis Labs: No results for input(s): PROCALCITON, LATICACIDVEN in the last 168 hours.  Recent Results (from the past 240 hour(s))  SARS CORONAVIRUS 2 (TAT 6-24 HRS) Nasopharyngeal Nasopharyngeal Swab     Status: None   Collection Time: 03/08/21  1:49 PM   Specimen: Nasopharyngeal Swab  Result Value Ref Range Status   SARS Coronavirus 2 NEGATIVE NEGATIVE Final    Comment: (NOTE) SARS-CoV-2 target nucleic acids are NOT DETECTED.  The SARS-CoV-2 RNA is generally detectable in upper and lower respiratory specimens during the acute phase of infection. Negative results do not preclude SARS-CoV-2 infection, do not rule out co-infections with other pathogens, and should not be used as the sole basis for treatment or other patient management decisions.  Negative results must be combined with clinical observations, patient history, and epidemiological information. The expected result is Negative.  Fact Sheet for Patients: SugarRoll.be  Fact Sheet for Healthcare Providers: https://www.woods-mathews.com/  This test is not yet approved or cleared by the [place] FDA and  has been authorized for detection and/or diagnosis of SARS-CoV-2 by FDA under an Emergency Use Authorization (EUA). This EUA will remain  in effect (meaning this test can be used) for the duration of the COVID-19 declaration under Se ction 564(b)(1) of the  Act, 21 U.S.C. section 360bbb-3(b)(1), unless the authorization is terminated or revoked sooner.  Performed at [facility], [place] [address], [place], [place] [address]   C Difficile Quick Screen w PCR reflex     Status: None   Collection Time: 03/08/21  9:07 PM   Specimen: STOOL  Result Value Ref Range Status   C Diff antigen NEGATIVE NEGATIVE Final   C Diff toxin NEGATIVE NEGATIVE Final   C Diff interpretation No C. difficile detected.  Final    Comment: Performed at [facility], [address], [place], [place] [address]  Gastrointestinal Panel by PCR , Stool     Status: None   Collection Time: 03/08/21  9:07 PM   Specimen: STOOL  Result Value Ref Range Status   Campylobacter species NOT DETECTED NOT DETECTED Final   Plesimonas shigelloides NOT DETECTED NOT DETECTED Final   Salmonella species NOT DETECTED NOT DETECTED Final   Yersinia enterocolitica NOT DETECTED NOT DETECTED Final   Vibrio species NOT DETECTED NOT DETECTED Final   Vibrio cholerae NOT DETECTED NOT DETECTED Final   Enteroaggregative E coli (EAEC) NOT DETECTED NOT DETECTED Final   Enteropathogenic E coli (EPEC) NOT DETECTED NOT DETECTED Final   Enterotoxigenic E coli (ETEC) NOT DETECTED NOT DETECTED Final   Shiga like toxin producing E coli (STEC) NOT DETECTED NOT DETECTED Final   Shigella/Enteroinvasive E coli (EIEC) NOT DETECTED NOT DETECTED Final   Cryptosporidium NOT DETECTED NOT DETECTED Final   Cyclospora cayetanensis NOT DETECTED NOT DETECTED Final   Entamoeba histolytica NOT DETECTED NOT DETECTED Final   Giardia lamblia NOT DETECTED NOT DETECTED Final   Adenovirus F40/41 NOT DETECTED NOT DETECTED Final   Astrovirus NOT DETECTED NOT DETECTED Final   Norovirus GI/GII NOT DETECTED NOT DETECTED Final   Rotavirus A NOT DETECTED NOT DETECTED Final   Sapovirus (I, II, IV, and V) NOT DETECTED NOT DETECTED Final    Comment: Performed at [facility], [address], [place], [place] [address]          Radiology Studies: No results found.      Scheduled Meds: . amiodarone  200 mg Oral Daily  . amitriptyline  25 mg Oral QHS  . insulin aspart  0-5 Units Subcutaneous QHS  . insulin aspart  0-6 Units Subcutaneous TID WC  . loperamide  4 mg Oral BID  . melatonin  6 mg Oral Once  . metoprolol tartrate  12.5 mg Oral BID  . pantoprazole  40 mg Oral Daily  . potassium chloride  40 mEq Oral BID  . rivaroxaban  20 mg Oral Q supper  . rosuvastatin  20 mg Oral q1800  . sodium bicarbonate  650 mg Oral BID   Continuous Infusions: . calcium gluconate    . lactated ringers 75 mL/hr at 03/11/21 0928     LOS: 2 days    Time spent: 35 minutes    Adryen Cookson Darleen Crocker, DO [name]  If 7PM-7AM, please contact night-coverage [email or web address] 03/11/2021, 10:46 AM

## 2021-03-12 DIAGNOSIS — E878 Other disorders of electrolyte and fluid balance, not elsewhere classified: Secondary | ICD-10-CM | POA: Diagnosis not present

## 2021-03-12 LAB — CBC
HCT: 32.1 % — ABNORMAL LOW (ref 36.0–46.0)
Hemoglobin: 9.7 g/dL — ABNORMAL LOW (ref 12.0–15.0)
MCH: 25.5 pg — ABNORMAL LOW (ref 26.0–34.0)
MCHC: 30.2 g/dL (ref 30.0–36.0)
MCV: 84.3 fL (ref 80.0–100.0)
Platelets: 121 10*3/uL — ABNORMAL LOW (ref 150–400)
RBC: 3.81 MIL/uL — ABNORMAL LOW (ref 3.87–5.11)
RDW: 18.9 % — ABNORMAL HIGH (ref 11.5–15.5)
WBC: 5.4 10*3/uL (ref 4.0–10.5)
nRBC: 0 % (ref 0.0–0.2)

## 2021-03-12 LAB — GLUCOSE, CAPILLARY
Glucose-Capillary: 353 mg/dL — ABNORMAL HIGH (ref 70–99)
Glucose-Capillary: 74 mg/dL (ref 70–99)
Glucose-Capillary: 48 mg/dL — ABNORMAL LOW (ref 70–99)
Glucose-Capillary: 39 mg/dL — CL (ref 70–99)
Glucose-Capillary: 92 mg/dL (ref 70–99)
Glucose-Capillary: 201 mg/dL — ABNORMAL HIGH (ref 70–99)
Glucose-Capillary: 160 mg/dL — ABNORMAL HIGH (ref 70–99)
Glucose-Capillary: 128 mg/dL — ABNORMAL HIGH (ref 70–99)

## 2021-03-12 LAB — BASIC METABOLIC PANEL
Anion gap: 8 (ref 5–15)
BUN: 14 mg/dL (ref 6–20)
CO2: 24 mmol/L (ref 22–32)
Calcium: 7.2 mg/dL — ABNORMAL LOW (ref 8.9–10.3)
Chloride: 102 mmol/L (ref 98–111)
Creatinine, Ser: 1.28 mg/dL — ABNORMAL HIGH (ref 0.44–1.00)
GFR, Estimated: 49 mL/min — ABNORMAL LOW (ref >60)
Glucose, Bld: 75 mg/dL (ref 70–99)
Potassium: 4.6 mmol/L (ref 3.5–5.1)
Sodium: 134 mmol/L — ABNORMAL LOW (ref 135–145)

## 2021-03-12 LAB — FERRITIN: Ferritin: 30 ng/mL (ref 11–307)

## 2021-03-12 LAB — IRON AND TIBC
Iron: 35 ug/dL (ref 28–170)
Saturation Ratios: 19 % (ref 10.4–31.8)
TIBC: 188 ug/dL — ABNORMAL LOW (ref 250–450)
UIBC: 153 ug/dL

## 2021-03-12 LAB — MAGNESIUM: Magnesium: 1.6 mg/dL — ABNORMAL LOW (ref 1.7–2.4)

## 2021-03-12 LAB — CALCIUM, IONIZED: Calcium, Ionized, Serum: 4.1 mg/dL — ABNORMAL LOW (ref 4.5–5.6)

## 2021-03-12 MED ORDER — DEXTROSE 50 % IV SOLN: 25.0000 g | INTRAVENOUS | Status: AC

## 2021-03-12 MED ORDER — MAGNESIUM SULFATE 2 GM/50ML IV SOLN
2.0000 g | Freq: Once | INTRAVENOUS | Status: AC
  Administered 2021-03-12: 2 g via INTRAVENOUS
  Filled 2021-03-12: qty 50

## 2021-03-12 MED ORDER — SODIUM CHLORIDE 0.9 % IV SOLN: INTRAVENOUS | Status: DC

## 2021-03-12 MED ORDER — PEG 3350-KCL-NA BICARB-NACL 420 G PO SOLR
4000.0000 mL | Freq: Once | ORAL | Status: AC
  Administered 2021-03-12: 4000 mL via ORAL

## 2021-03-12 MED ORDER — PANTOPRAZOLE SODIUM 40 MG PO TBEC
40.0000 mg | DELAYED_RELEASE_TABLET | Freq: Every day | ORAL | Status: DC
  Administered 2021-03-13 – 2021-03-16 (×4): 40 mg via ORAL
  Filled 2021-03-12 (×4): qty 1

## 2021-03-12 MED ORDER — DEXTROSE 50 % IV SOLN
INTRAVENOUS | Status: AC
  Administered 2021-03-12: 50 mL
  Filled 2021-03-12: qty 50

## 2021-03-12 MED ORDER — PREGABALIN 50 MG PO CAPS
50.0000 mg | ORAL_CAPSULE | Freq: Two times a day (BID) | ORAL | Status: DC
  Administered 2021-03-12 – 2021-03-13 (×3): 50 mg via ORAL
  Filled 2021-03-12 (×3): qty 1

## 2021-03-12 NOTE — Progress Notes (Signed)
PROGRESS NOTE    [name]  MVE:[id number] DOB: [birth year] DOA: 03/08/2021 PCP: Frazier Richards, MD   Brief Narrative:   [name] a 55 y.o.femalewith medical history significant foratrial fibrillation, systolic and diastolic CHF, diabetes mellitus, hypertension,bilateral above-knee amputation,CKD 4, NSTEMI. Patient was sent to the ED by her primary care provider(clinic in [place])reports of low potassium. Patient has had diarrhea over the past 4 -5weeks.Patient was admitted for severe electrolyte abnormalities in the setting of her home diarrhea.  She continues to have persistent diarrhea for which GI will be consulted for evaluation.  Stool studies have returned negative thus far. Celiac studies pending.   Assessment & Plan:   Principal Problem:   Electrolyte abnormality Active Problems:   Peripheral vascular disease (HCC)   Chronic combined systolic (congestive) and diastolic (congestive) heart failure (HCC)   CKD stage 4 due to type 2 diabetes mellitus (HCC)   S/P bilateral above knee amputation (HCC)   Hypokalemia   Severe electrolyte abnormalities-improved -Noted to have hypokalemia, hypomagnesemia, and hypocalcemia in the setting of severe diarrhea -Continue to replete magnesium and monitor -Monitor on telemetry -Check ionized calcium pending  Intractable diarrhea-ongoing -Likely related to lactose intolerance and pt has been consuming dairy -Clear liquid diet per GI -C. difficile study negative -GI panelnegative -Started imodium 4mg  bid 4/23, but has not helped -Abdominal exam benign -Appreciate GI evaluation with plans for EGD/colonoscopy on 4/26 -Celiac panel pending -TSH 2.88  Atrial fibrillation-currently in sinus rhythm -Continue home amiodarone -Metoprolol to be held for SBP<100 -Hold Xarelto in anticipation of endoscopy 4/26 and placed on full dose Lovenox  Chronic combined systolic and diastolic CHF-stable -Plan to hold  torsemidefor now -Last 2D echocardiogram 11/2020 with LVEF 35-40%  Controlled type 2 diabetes -Patient has not taken insulin in the last several days due to poor oral intake -Hemoglobin A1c 5.7% -Continue SSI  CKD stage IIIb -Currently stable  Peripheral vascular disease with bilateral above-knee amputations -Resume Lyrica 4/25  Stage III pressure ulcer -Wound care  Prolonged QTC -Likely related to electrolyte abnormalities -repeat EKG improved -Resumed amitryptiline 4/24   DVT prophylaxis:Xarelto to full dose Lovenox 4/24 Code Status:Full Family Communication:None at bedside, patient will call Disposition Plan: Status is: Inpatient  Remains inpatient appropriate because:Persistent severe electrolyte disturbances and IV treatments appropriate due to intensity of illness or inability to take PO   Dispo: The patient is from:Home Anticipated d/c is GE:ZMOQ Patient currently is not medically stable to d/c. Difficult to place patient No   Skin Assessment:  I have examined the patient's skin and I agree with the wound assessment as performed by the wound care RN as outlined below:  Pressure Injury Stage III - Full thickness tissue loss. Subcutaneous fat may be visible but bone, tendon or muscle are NOT exposed. (Active)    Location: Ischial tuberosity  Location Orientation: Right  Staging: Stage III - Full thickness tissue loss. Subcutaneous fat may be visible but bone, tendon or muscle are NOT exposed.  Wound Description (Comments):   Present on Admission: Yes    Consultants:  GI  Procedures:  See below  Antimicrobials:  None  Subjective: Patient seen and evaluated today with ongoing watery bm that is quite frequent.  Objective: Vitals:   03/11/21 1436 03/11/21 2150 03/12/21 0522 03/12/21 0845  BP: 116/60 (!) 141/77 (!) 81/61 118/60  Pulse: 62 63 61 68  Resp: 16 18 18    Temp: 98.7 F  (37.1 C) 97.8 F (36.6 C) 98.2 F (36.8  C)   TempSrc: Oral  Oral   SpO2: 98% 95% 98%   Weight:        Intake/Output Summary (Last 24 hours) at 03/12/2021 0922 Last data filed at 03/11/2021 1900 Gross per 24 hour  Intake 1245.14 ml  Output --  Net 1245.14 ml   Filed Weights   03/08/21 1026  Weight: 101.2 kg    Examination:  General exam: Appears calm and comfortable  Respiratory system: Clear to auscultation. Respiratory effort normal. Cardiovascular system: S1 & S2 heard, RRR.  Gastrointestinal system: Abdomen is soft Central nervous system: Alert and awake Extremities: B AKA Skin: No significant lesions noted Psychiatry: Flat affect.    Data Reviewed: I have personally reviewed following labs and imaging studies  CBC: Recent Labs  Lab 03/08/21 1055 03/10/21 0415 03/11/21 0415 03/12/21 0508  WBC 6.6 8.4 7.5 5.4  NEUTROABS 5.1  --   --   --   HGB 11.6* 9.9* 9.8* 9.7*  HCT 37.7 32.0* 32.5* 32.1*  MCV 82.5 82.5 84.6 84.3  PLT 120* 112* 113* 824*   Basic Metabolic Panel: Recent Labs  Lab 03/08/21 1055 03/09/21 0557 03/10/21 0415 03/11/21 0415 03/12/21 0508  NA 138 137 136 135 134*  K 2.9* 2.1* 3.1* 3.4* 4.6  CL 99 99 101 101 102  CO2 28 27 27 25 24   GLUCOSE 83 107* 147* 119* 75  BUN 7 7 11 14 14   CREATININE 1.21* 1.11* 1.37* 1.40* 1.28*  CALCIUM 5.5* 5.9* 6.3* 6.8* 7.2*  MG 0.8* 1.2* 1.4* 1.8 1.6*   GFR: Estimated Creatinine Clearance: 60 mL/min (A) (by C-G formula based on SCr of 1.28 mg/dL (H)). Liver Function Tests: Recent Labs  Lab 03/08/21 1548  AST 12*  ALT 7  ALKPHOS 95  BILITOT 0.9  PROT 6.5  ALBUMIN 2.7*   No results for input(s): LIPASE, AMYLASE in the last 168 hours. No results for input(s): AMMONIA in the last 168 hours. Coagulation Profile: No results for input(s): INR, PROTIME in the last 168 hours. Cardiac Enzymes: No results for input(s): CKTOTAL, CKMB, CKMBINDEX, TROPONINI in the last 168 hours. BNP (last 3 results) No  results for input(s): PROBNP in the last 8760 hours. HbA1C: No results for input(s): HGBA1C in the last 72 hours. CBG: Recent Labs  Lab 03/11/21 1130 03/11/21 1725 03/11/21 2148 03/12/21 0741 03/12/21 0801  GLUCAP 128* 83 103* 74 353*   Lipid Profile: No results for input(s): CHOL, HDL, LDLCALC, TRIG, CHOLHDL, LDLDIRECT in the last 72 hours. Thyroid Function Tests: Recent Labs    03/11/21 1209  TSH 2.889   Anemia Panel: No results for input(s): VITAMINB12, FOLATE, FERRITIN, TIBC, IRON, RETICCTPCT in the last 72 hours. Sepsis Labs: No results for input(s): PROCALCITON, LATICACIDVEN in the last 168 hours.  Recent Results (from the past 240 hour(s))  SARS CORONAVIRUS 2 (TAT 6-24 HRS) Nasopharyngeal Nasopharyngeal Swab     Status: None   Collection Time: 03/08/21  1:49 PM   Specimen: Nasopharyngeal Swab  Result Value Ref Range Status   SARS Coronavirus 2 NEGATIVE NEGATIVE Final    Comment: (NOTE) SARS-CoV-2 target nucleic acids are NOT DETECTED.  The SARS-CoV-2 RNA is generally detectable in upper and lower respiratory specimens during the acute phase of infection. Negative results do not preclude SARS-CoV-2 infection, do not rule out co-infections with other pathogens, and should not be used as the sole basis for treatment or other patient management decisions. Negative results must be combined with clinical observations, patient history, and epidemiological  information. The expected result is Negative.  Fact Sheet for Patients: SugarRoll.be  Fact Sheet for Healthcare Providers: https://www.woods-mathews.com/  This test is not yet approved or cleared by the [place] FDA and  has been authorized for detection and/or diagnosis of SARS-CoV-2 by FDA under an Emergency Use Authorization (EUA). This EUA will remain  in effect (meaning this test can be used) for the duration of the COVID-19 declaration under Se ction 564(b)(1)  of the Act, 21 U.S.C. section 360bbb-3(b)(1), unless the authorization is terminated or revoked sooner.  Performed at [facility], [place] [address], [place], [place] [address]   C Difficile Quick Screen w PCR reflex     Status: None   Collection Time: 03/08/21  9:07 PM   Specimen: STOOL  Result Value Ref Range Status   C Diff antigen NEGATIVE NEGATIVE Final   C Diff toxin NEGATIVE NEGATIVE Final   C Diff interpretation No C. difficile detected.  Final    Comment: Performed at [facility], [address] [place], [place] [address]  Gastrointestinal Panel by PCR , Stool     Status: None   Collection Time: 03/08/21  9:07 PM   Specimen: STOOL  Result Value Ref Range Status   Campylobacter species NOT DETECTED NOT DETECTED Final   Plesimonas shigelloides NOT DETECTED NOT DETECTED Final   Salmonella species NOT DETECTED NOT DETECTED Final   Yersinia enterocolitica NOT DETECTED NOT DETECTED Final   Vibrio species NOT DETECTED NOT DETECTED Final   Vibrio cholerae NOT DETECTED NOT DETECTED Final   Enteroaggregative E coli (EAEC) NOT DETECTED NOT DETECTED Final   Enteropathogenic E coli (EPEC) NOT DETECTED NOT DETECTED Final   Enterotoxigenic E coli (ETEC) NOT DETECTED NOT DETECTED Final   Shiga like toxin producing E coli (STEC) NOT DETECTED NOT DETECTED Final   Shigella/Enteroinvasive E coli (EIEC) NOT DETECTED NOT DETECTED Final   Cryptosporidium NOT DETECTED NOT DETECTED Final   Cyclospora cayetanensis NOT DETECTED NOT DETECTED Final   Entamoeba histolytica NOT DETECTED NOT DETECTED Final   Giardia lamblia NOT DETECTED NOT DETECTED Final   Adenovirus F40/41 NOT DETECTED NOT DETECTED Final   Astrovirus NOT DETECTED NOT DETECTED Final   Norovirus GI/GII NOT DETECTED NOT DETECTED Final   Rotavirus A NOT DETECTED NOT DETECTED Final   Sapovirus (I, II, IV, and V) NOT DETECTED NOT DETECTED Final    Comment: Performed at [facility], [address], [place], [place]  [address]         Radiology Studies: No results found.      Scheduled Meds: . amiodarone  200 mg Oral Daily  . amitriptyline  25 mg Oral QHS  . enoxaparin (LOVENOX) injection  100 mg Subcutaneous Q12H  . insulin aspart  0-5 Units Subcutaneous QHS  . insulin aspart  0-6 Units Subcutaneous TID WC  . loperamide  4 mg Oral BID  . melatonin  6 mg Oral Once  . metoprolol tartrate  12.5 mg Oral BID  . pantoprazole  40 mg Oral Daily  . pregabalin  50 mg Oral BID  . rosuvastatin  20 mg Oral q1800  . sodium bicarbonate  650 mg Oral BID   Continuous Infusions: . lactated ringers 75 mL/hr at 03/11/21 2227  . magnesium sulfate bolus IVPB 2 g (03/12/21 0844)     LOS: 3 days    Time spent: 35 minutes    Shawntay Prest D Manuella Ghazi, DO [name]  If 7PM-7AM, please contact night-coverage [email or web address] 03/12/2021, 9:22  AM

## 2021-03-12 NOTE — Progress Notes (Signed)
    Subjective: Chronic N/V. Early satiety. No postprandial abdominal pain. Multiple loose stools overnight, non-bloody. LLQ discomfort and cramping with loose stools. No abdominal pain currently. No solid food dysphagia. She does note chronic pill dysphagia.   Objective: Vital signs in last 24 hours: Temp:  [97.8 F (36.6 C)-98.7 F (37.1 C)] 98.2 F (36.8 C) (04/25 0522) Pulse Rate:  [61-68] 68 (04/25 0845) Resp:  [16-18] 18 (04/25 0522) BP: (81-141)/(60-77) 118/60 (04/25 0845) SpO2:  [95 %-98 %] 98 % (04/25 0522) Last BM Date: 03/11/21 General:   Alert and oriented, pleasant Abdomen:  Bowel sounds present, obese, well-healed midline incision, soft, non-tender, non-distended. No HSM or hernias noted. No rebound or guarding. No masses appreciated  Extremities:  With right BKA and left AKA Neurologic:  Alert and  oriented x4  Intake/Output from previous day: 04/24 0701 - 04/25 0700 In: 1485.1 [P.O.:720; I.V.:355.1; IV Piggyback:50] Out: -  Intake/Output this shift: No intake/output data recorded.  Lab Results: Recent Labs    03/10/21 0415 03/11/21 0415 03/12/21 0508  WBC 8.4 7.5 5.4  HGB 9.9* 9.8* 9.7*  HCT 32.0* 32.5* 32.1*  PLT 112* 113* 121*   BMET Recent Labs    03/10/21 0415 03/11/21 0415 03/12/21 0508  NA 136 135 134*  K 3.1* 3.4* 4.6  CL 101 101 102  CO2 27 25 24   GLUCOSE 147* 119* 75  BUN 11 14 14   CREATININE 1.37* 1.40* 1.28*  CALCIUM 6.3* 6.8* 7.2*   Lab Results  Component Value Date   FERRITIN 26 11/29/2020     Assessment: 55 year old female admitted for severe electrolyte abnormalities in setting of chronic diarrhea, nausea and vomiting, with diarrhea worsening in the past few weeks. Cdiff and GI pathogen panel negative this admission. TSH normal. Celiac serologies remain in process. History of afib on Xarelto, with last dose evening of 4/23. She is now on Lovenox, with last dose scheduled for midday in preparation for procedures tomorrow.    Diarrhea: multiple loose stools overnight. No improvement with Imodium. Remains without overt GI bleeding. Notes worsening with fiber. Last colonoscopy in her 20s/30s, due to "stomach issues". Associated LLQ discomfort with diarrhea but abdominal exam benign this morning. Holding off on imaging unless worsening. Plans for colonoscopy on 4/26. 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   N/V: chronic. A1c 5.6 this admission. EGD planned at time of colonoscopy. She has no postprandial abdominal pain but does note early satiety. Pill dysphagia only and no solid food dysphagia.   Anemia: chronic and Hgb near baseline. Likely due to chronic disease. Update iron studies.   Plan: Clear liquids today NPO after midnight except sips with meds Colonoscopy/EGD+/- dilation planned for 4/26 Last dose of Lovenox at 1245 today Check iron studies Continue PPI daily before breakfast   Jamie Needs, PhD, ANP-BC [facility]     LOS: 3 days    03/12/2021, 9:01 AM

## 2021-03-12 NOTE — Progress Notes (Signed)
Inpatient Diabetes Program Recommendations  AACE/ADA: New Consensus Statement on Inpatient Glycemic Control (2015)  Target Ranges:  Prepandial:   less than 140 mg/dL      Peak postprandial:   less than 180 mg/dL (1-2 hours)      Critically ill patients:  140 - 180 mg/dL   Lab Results  Component Value Date   GLUCAP 201 (H) 03/12/2021   HGBA1C 5.6 03/09/2021    Review of Glycemic Control Results for SUMIRE, HALBLEIB (MRN [id number]) as of 03/12/2021 16:09  Ref. Range 03/12/2021 08:01 03/12/2021 11:09 03/12/2021 11:47 03/12/2021 12:18 03/12/2021 14:24  Glucose-Capillary Latest Ref Range: 70 - 99 mg/dL 353 (H) 48 (L) 39 (LL) 92 201 (H)   Diabetes history: DM Outpatient Diabetes medications:  Humalog 4-10 units tid with meals Current orders for Inpatient glycemic control:  Novolog 0-6 units tid with meals and HS  Inpatient Diabetes Program Recommendations:    Consider holding Novolog correction for now?    Thanks,  Adah Perl, RN, BC-ADM Inpatient Diabetes Coordinator Pager [phone] (8a-5p)

## 2021-03-13 ENCOUNTER — Encounter (HOSPITAL_COMMUNITY): Payer: Self-pay | Admitting: Internal Medicine

## 2021-03-13 ENCOUNTER — Other Ambulatory Visit: Payer: Self-pay

## 2021-03-13 ENCOUNTER — Inpatient Hospital Stay (HOSPITAL_COMMUNITY): Payer: Medicare Other | Admitting: Certified Registered Nurse Anesthetist

## 2021-03-13 ENCOUNTER — Encounter (HOSPITAL_COMMUNITY)
Admission: EM | Disposition: A | Discharge status: Home / Self Care | Payer: Self-pay | Source: Home / Self Care | Attending: Internal Medicine

## 2021-03-13 DIAGNOSIS — D123 Benign neoplasm of transverse colon: Secondary | ICD-10-CM | POA: Diagnosis not present

## 2021-03-13 DIAGNOSIS — D122 Benign neoplasm of ascending colon: Secondary | ICD-10-CM | POA: Diagnosis not present

## 2021-03-13 DIAGNOSIS — K3189 Other diseases of stomach and duodenum: Secondary | ICD-10-CM

## 2021-03-13 DIAGNOSIS — D124 Benign neoplasm of descending colon: Secondary | ICD-10-CM | POA: Diagnosis not present

## 2021-03-13 DIAGNOSIS — E878 Other disorders of electrolyte and fluid balance, not elsewhere classified: Secondary | ICD-10-CM | POA: Diagnosis not present

## 2021-03-13 DIAGNOSIS — R197 Diarrhea, unspecified: Secondary | ICD-10-CM | POA: Diagnosis not present

## 2021-03-13 HISTORY — PX: ESOPHAGOGASTRODUODENOSCOPY (EGD) WITH PROPOFOL: SHX5813

## 2021-03-13 HISTORY — PX: BIOPSY: SHX5522

## 2021-03-13 HISTORY — PX: POLYPECTOMY: SHX5525

## 2021-03-13 HISTORY — PX: COLONOSCOPY WITH PROPOFOL: SHX5780

## 2021-03-13 LAB — GLUCOSE, CAPILLARY
Glucose-Capillary: 102 mg/dL — ABNORMAL HIGH (ref 70–99)
Glucose-Capillary: 57 mg/dL — ABNORMAL LOW (ref 70–99)
Glucose-Capillary: 72 mg/dL (ref 70–99)
Glucose-Capillary: 66 mg/dL — ABNORMAL LOW (ref 70–99)
Glucose-Capillary: 82 mg/dL (ref 70–99)
Glucose-Capillary: 97 mg/dL (ref 70–99)
Glucose-Capillary: 153 mg/dL — ABNORMAL HIGH (ref 70–99)

## 2021-03-13 LAB — CBC
HCT: 33.3 % — ABNORMAL LOW (ref 36.0–46.0)
Hemoglobin: 9.8 g/dL — ABNORMAL LOW (ref 12.0–15.0)
MCH: 24.9 pg — ABNORMAL LOW (ref 26.0–34.0)
MCHC: 29.4 g/dL — ABNORMAL LOW (ref 30.0–36.0)
MCV: 84.5 fL (ref 80.0–100.0)
Platelets: 147 10*3/uL — ABNORMAL LOW (ref 150–400)
RBC: 3.94 MIL/uL (ref 3.87–5.11)
RDW: 18.6 % — ABNORMAL HIGH (ref 11.5–15.5)
WBC: 5 10*3/uL (ref 4.0–10.5)
nRBC: 0 % (ref 0.0–0.2)

## 2021-03-13 LAB — BASIC METABOLIC PANEL
Anion gap: 12 (ref 5–15)
BUN: 13 mg/dL (ref 6–20)
CO2: 20 mmol/L — ABNORMAL LOW (ref 22–32)
Calcium: 7.5 mg/dL — ABNORMAL LOW (ref 8.9–10.3)
Chloride: 101 mmol/L (ref 98–111)
Creatinine, Ser: 1.17 mg/dL — ABNORMAL HIGH (ref 0.44–1.00)
GFR, Estimated: 55 mL/min — ABNORMAL LOW (ref >60)
Glucose, Bld: 67 mg/dL — ABNORMAL LOW (ref 70–99)
Potassium: 4.6 mmol/L (ref 3.5–5.1)
Sodium: 133 mmol/L — ABNORMAL LOW (ref 135–145)

## 2021-03-13 LAB — MAGNESIUM: Magnesium: 1.9 mg/dL (ref 1.7–2.4)

## 2021-03-13 SURGERY — COLONOSCOPY WITH PROPOFOL
Anesthesia: General

## 2021-03-13 MED ORDER — DEXTROSE 50 % IV SOLN
12.5000 g | INTRAVENOUS | Status: AC
  Administered 2021-03-13: 12.5 g via INTRAVENOUS

## 2021-03-13 MED ORDER — PROPOFOL 10 MG/ML IV BOLUS
INTRAVENOUS | Status: DC | PRN
  Administered 2021-03-13: 80 mg via INTRAVENOUS
  Administered 2021-03-13 (×2): 10 mg via INTRAVENOUS

## 2021-03-13 MED ORDER — PHENYLEPHRINE HCL (PRESSORS) 10 MG/ML IV SOLN
INTRAVENOUS | Status: DC | PRN
  Administered 2021-03-13 (×3): 80 ug via INTRAVENOUS
  Administered 2021-03-13 (×4): 40 ug via INTRAVENOUS
  Administered 2021-03-13 (×2): 80 ug via INTRAVENOUS

## 2021-03-13 MED ORDER — EPHEDRINE SULFATE 50 MG/ML IJ SOLN
INTRAMUSCULAR | Status: DC | PRN
  Administered 2021-03-13 (×4): 10 mg via INTRAVENOUS
  Administered 2021-03-13 (×2): 5 mg via INTRAVENOUS

## 2021-03-13 MED ORDER — DEXTROSE 50 % IV SOLN
INTRAVENOUS | Status: AC
  Administered 2021-03-13: 50 mL
  Filled 2021-03-13: qty 50

## 2021-03-13 MED ORDER — LIDOCAINE HCL (CARDIAC) PF 100 MG/5ML IV SOSY
PREFILLED_SYRINGE | INTRAVENOUS | Status: DC | PRN
  Administered 2021-03-13: 50 mg via INTRAVENOUS

## 2021-03-13 MED ORDER — STERILE WATER FOR IRRIGATION IR SOLN
Status: DC | PRN
  Administered 2021-03-13: 100 mL

## 2021-03-13 MED ORDER — ZINC OXIDE 40 % EX OINT
TOPICAL_OINTMENT | Freq: Every day | CUTANEOUS | Status: DC
  Filled 2021-03-13: qty 57

## 2021-03-13 MED ORDER — LACTATED RINGERS IV SOLN: INTRAVENOUS | Status: DC

## 2021-03-13 MED ORDER — PREGABALIN 50 MG PO CAPS
100.0000 mg | ORAL_CAPSULE | Freq: Two times a day (BID) | ORAL | Status: DC
  Administered 2021-03-13 – 2021-03-16 (×6): 100 mg via ORAL
  Filled 2021-03-13 (×6): qty 2

## 2021-03-13 MED ORDER — DEXTROSE 50 % IV SOLN: 1.0000 | Freq: Once | INTRAVENOUS | Status: DC

## 2021-03-13 MED ORDER — DEXTROSE 50 % IV SOLN
INTRAVENOUS | Status: AC
  Filled 2021-03-13: qty 50

## 2021-03-13 MED ORDER — PROPOFOL 500 MG/50ML IV EMUL
INTRAVENOUS | Status: DC | PRN
  Administered 2021-03-13: 125 ug/kg/min via INTRAVENOUS

## 2021-03-13 NOTE — Progress Notes (Signed)
Patient returned from Colonoscopy procedure, alert and responsive , VSS.       03/13/21 1512  Vitals  Temp (!) 97.5 F (36.4 C)  Temp Source Oral  BP 129/60  MAP (mmHg) 80  BP Method Automatic  Pulse Rate 61  MEWS COLOR  MEWS Score Color Green  Oxygen Therapy  SpO2 96 %  O2 Device Room Air  Patient Activity (if Appropriate) In bed  Pulse Oximetry Type Intermittent  MEWS Score  MEWS Temp 0  MEWS Systolic 0  MEWS Pulse 0  MEWS RR 0  MEWS LOC 0  MEWS Score 0

## 2021-03-13 NOTE — Progress Notes (Signed)
Attempted to perform tap water enema. Felt resistance to tubing as well as patient had poor tolerance to procedure. This was not performed. Patient does have clear liquid stools at this time.

## 2021-03-13 NOTE — Anesthesia Postprocedure Evaluation (Signed)
Anesthesia Post Note  Patient: [name]  Procedure(s) Performed: COLONOSCOPY WITH PROPOFOL (N/A ) ESOPHAGOGASTRODUODENOSCOPY (EGD) WITH PROPOFOL (N/A ) BIOPSY POLYPECTOMY  Patient location during evaluation: Endoscopy Anesthesia Type: General Level of consciousness: awake and alert and oriented Pain management: pain level controlled Vital Signs Assessment: post-procedure vital signs reviewed and stable Respiratory status: spontaneous breathing and respiratory function stable Cardiovascular status: blood pressure returned to baseline and stable Postop Assessment: no apparent nausea or vomiting Anesthetic complications: no   No complications documented.   Last Vitals:  Vitals:   03/13/21 1250 03/13/21 1440  BP: 122/70 (!) 92/42  Pulse:    Resp: 13 16  Temp: 36.7 C   SpO2: 95% 100%    Last Pain:  Vitals:   03/13/21 1440  TempSrc: Oral  PainSc:                  Torry Adamczak C Wandell Scullion

## 2021-03-13 NOTE — Progress Notes (Signed)
PROGRESS NOTE    [name]  EXB:[id number] DOB: 1966-01-06 DOA: 03/08/2021 PCP: Frazier Richards, MD   Brief Narrative:   [name] a 55 y.o.femalewith medical history significant foratrial fibrillation, systolic and diastolic CHF, diabetes mellitus, hypertension,bilateral above-knee amputation,CKD 4, NSTEMI. Patient was sent to the ED by her primary care provider(clinic in [place])reports of low potassium. Patient has had diarrhea over the past 4 -5weeks.Patient was admitted for severe electrolyte abnormalities in the setting of her home diarrhea.She continues to have persistent diarrhea for which GI is planning upper and lower endoscopy today.  Full dose Lovenox held for procedures today and can be resumed a.m. if stable.  Assessment & Plan:   Principal Problem:   Electrolyte abnormality Active Problems:   Peripheral vascular disease (HCC)   Chronic combined systolic (congestive) and diastolic (congestive) heart failure (HCC)   CKD stage 4 due to type 2 diabetes mellitus (HCC)   S/P bilateral above knee amputation (HCC)   Hypokalemia   Severe electrolyte abnormalities-improved -Noted to have hypokalemia, hypomagnesemia, and hypocalcemia on admission -Repeat BMP in am -Monitor on telemetry  Intractable diarrhea-ongoing -Likely related to lactose intolerance and pt has been consuming dairy -Clear liquid diet per GI -C. difficile study negative -GI panelnegative -Startedimodium 4mg  bid 4/23, but has not helped -Abdominal exam benign -Appreciate GI evaluationwith plans for EGD/colonoscopy on 4/26 -Celiac panel pending -TSH 2.88  Atrial fibrillation-currently in sinus rhythm -Continue home amiodarone -Metoprolol to be held for SBP<100 -Held anticoagulation for endoscopy today  Chronic combined systolic and diastolic CHF-stable -Plan to hold torsemidefor now -Last 2D echocardiogram 11/2020 with LVEF 35-40%  Controlled type 2  diabetes -Patient has not taken insulin in the last several days due to poor oral intake -Hemoglobin A1c 5.7% -Some episodes of hypoglycemia noted -Continue SSI  CKD stageIIIb -Currently stable  Peripheral vascular disease with bilateral above-knee amputations -Resume Lyrica 4/25, now increased to 100mg  BID to help with neuropathy  Prolonged QTC -Likely related to electrolyte abnormalities -repeat EKGimproved -Resumed amitryptiline 4/24   DVT prophylaxis:Xareltoto full dose Lovenox 4/24, all held 4/25 for endoscopy. Resume Xarelto by am if stable. Code Status:Full Family Communication:None at bedside, patient will call Disposition Plan: Status is: Inpatient  Remains inpatient appropriate because:Persistent severe electrolyte disturbances and IV treatments appropriate due to intensity of illness or inability to take PO   Dispo: The patient is from:Home Anticipated d/c is NU:UVOZ Patient currently is not medically stable to d/c. Difficult to place patient No   Skin Assessment:  I have examined the patient's skin and I agree with the wound assessment as performed by the wound care RN as outlined below:  Pressure Injury Stage III - Full thickness tissue loss. Subcutaneous fat may be visible but bone, tendon or muscle are NOT exposed. (Active)    Location: Ischial tuberosity  Location Orientation: Right  Staging: Stage III - Full thickness tissue loss. Subcutaneous fat may be visible but bone, tendon or muscle are NOT exposed.  Wound Description (Comments):   Present on Admission: Yes    Consultants:  GI  Procedures:  See below  Antimicrobials:  None  Subjective: Patient seen and evaluated today with ongoing loose, liquidy bowel movements and also restless legs this AM. No acute concerns or events noted overnight.   Objective: Vitals:   03/12/21 0522 03/12/21 0845 03/12/21 2221 03/13/21 0420   BP: (!) 81/61 118/60 (!) 151/69 119/68  Pulse: 61 68 (!) 58 (!) 53  Resp: 18   18  Temp: 98.2 F (36.8 C)  (!) 97.5 F (36.4 C) 98 F (36.7 C)  TempSrc: Oral  Oral Oral  SpO2: 98%  94% 97%  Weight:        Intake/Output Summary (Last 24 hours) at 03/13/2021 1052 Last data filed at 03/13/2021 0600 Gross per 24 hour  Intake 412.09 ml  Output --  Net 412.09 ml   Filed Weights   03/08/21 1026  Weight: 101.2 kg    Examination:  General exam: Appears calm and comfortable, poor dentition Respiratory system: Clear to auscultation. Respiratory effort normal. Cardiovascular system: S1 & S2 heard, RRR.  Gastrointestinal system: Abdomen is soft Central nervous system: Alert and awake Extremities: B AKA Skin: No significant lesions noted Psychiatry: Flat affect.    Data Reviewed: I have personally reviewed following labs and imaging studies  CBC: Recent Labs  Lab 03/08/21 1055 03/10/21 0415 03/11/21 0415 03/12/21 0508 03/13/21 0534  WBC 6.6 8.4 7.5 5.4 5.0  NEUTROABS 5.1  --   --   --   --   HGB 11.6* 9.9* 9.8* 9.7* 9.8*  HCT 37.7 32.0* 32.5* 32.1* 33.3*  MCV 82.5 82.5 84.6 84.3 84.5  PLT 120* 112* 113* 121* 267*   Basic Metabolic Panel: Recent Labs  Lab 03/09/21 0557 03/10/21 0415 03/11/21 0415 03/12/21 0508 03/13/21 0534  NA 137 136 135 134* 133*  K 2.1* 3.1* 3.4* 4.6 4.6  CL 99 101 101 102 101  CO2 27 27 25 24  20*  GLUCOSE 107* 147* 119* 75 67*  BUN 7 11 14 14 13   CREATININE 1.11* 1.37* 1.40* 1.28* 1.17*  CALCIUM 5.9* 6.3* 6.8* 7.2* 7.5*  MG 1.2* 1.4* 1.8 1.6* 1.9   GFR: Estimated Creatinine Clearance: 65.6 mL/min (A) (by C-G formula based on SCr of 1.17 mg/dL (H)). Liver Function Tests: Recent Labs  Lab 03/08/21 1548  AST 12*  ALT 7  ALKPHOS 95  BILITOT 0.9  PROT 6.5  ALBUMIN 2.7*   No results for input(s): LIPASE, AMYLASE in the last 168 hours. No results for input(s): AMMONIA in the last 168 hours. Coagulation Profile: No results for  input(s): INR, PROTIME in the last 168 hours. Cardiac Enzymes: No results for input(s): CKTOTAL, CKMB, CKMBINDEX, TROPONINI in the last 168 hours. BNP (last 3 results) No results for input(s): PROBNP in the last 8760 hours. HbA1C: No results for input(s): HGBA1C in the last 72 hours. CBG: Recent Labs  Lab 03/12/21 1424 03/12/21 1637 03/12/21 2046 03/13/21 0738 03/13/21 0758  GLUCAP 201* 160* 128* 57* 102*   Lipid Profile: No results for input(s): CHOL, HDL, LDLCALC, TRIG, CHOLHDL, LDLDIRECT in the last 72 hours. Thyroid Function Tests: Recent Labs    03/11/21 1209  TSH 2.889   Anemia Panel: Recent Labs    03/12/21 1035  FERRITIN 30  TIBC 188*  IRON 35   Sepsis Labs: No results for input(s): PROCALCITON, LATICACIDVEN in the last 168 hours.  Recent Results (from the past 240 hour(s))  SARS CORONAVIRUS 2 (TAT 6-24 HRS) Nasopharyngeal Nasopharyngeal Swab     Status: None   Collection Time: 03/08/21  1:49 PM   Specimen: Nasopharyngeal Swab  Result Value Ref Range Status   SARS Coronavirus 2 NEGATIVE NEGATIVE Final    Comment: (NOTE) SARS-CoV-2 target nucleic acids are NOT DETECTED.  The SARS-CoV-2 RNA is generally detectable in upper and lower respiratory specimens during the acute phase of infection. Negative results do not preclude SARS-CoV-2 infection, do not rule out co-infections with other pathogens, and  should not be used as the sole basis for treatment or other patient management decisions. Negative results must be combined with clinical observations, patient history, and epidemiological information. The expected result is Negative.  Fact Sheet for Patients: SugarRoll.be  Fact Sheet for Healthcare Providers: https://www.woods-mathews.com/  This test is not yet approved or cleared by the [place] FDA and  has been authorized for detection and/or diagnosis of SARS-CoV-2 by FDA under an Emergency Use  Authorization (EUA). This EUA will remain  in effect (meaning this test can be used) for the duration of the COVID-19 declaration under Se ction 564(b)(1) of the Act, 21 U.S.C. section 360bbb-3(b)(1), unless the authorization is terminated or revoked sooner.  Performed at [facility], [place] [address], [place], [place] [address]   C Difficile Quick Screen w PCR reflex     Status: None   Collection Time: 03/08/21  9:07 PM   Specimen: STOOL  Result Value Ref Range Status   C Diff antigen NEGATIVE NEGATIVE Final   C Diff toxin NEGATIVE NEGATIVE Final   C Diff interpretation No C. difficile detected.  Final    Comment: Performed at [facility], [address], [place], [place] [address]  Gastrointestinal Panel by PCR , Stool     Status: None   Collection Time: 03/08/21  9:07 PM   Specimen: STOOL  Result Value Ref Range Status   Campylobacter species NOT DETECTED NOT DETECTED Final   Plesimonas shigelloides NOT DETECTED NOT DETECTED Final   Salmonella species NOT DETECTED NOT DETECTED Final   Yersinia enterocolitica NOT DETECTED NOT DETECTED Final   Vibrio species NOT DETECTED NOT DETECTED Final   Vibrio cholerae NOT DETECTED NOT DETECTED Final   Enteroaggregative E coli (EAEC) NOT DETECTED NOT DETECTED Final   Enteropathogenic E coli (EPEC) NOT DETECTED NOT DETECTED Final   Enterotoxigenic E coli (ETEC) NOT DETECTED NOT DETECTED Final   Shiga like toxin producing E coli (STEC) NOT DETECTED NOT DETECTED Final   Shigella/Enteroinvasive E coli (EIEC) NOT DETECTED NOT DETECTED Final   Cryptosporidium NOT DETECTED NOT DETECTED Final   Cyclospora cayetanensis NOT DETECTED NOT DETECTED Final   Entamoeba histolytica NOT DETECTED NOT DETECTED Final   Giardia lamblia NOT DETECTED NOT DETECTED Final   Adenovirus F40/41 NOT DETECTED NOT DETECTED Final   Astrovirus NOT DETECTED NOT DETECTED Final   Norovirus GI/GII NOT DETECTED NOT DETECTED Final   Rotavirus A NOT DETECTED NOT DETECTED  Final   Sapovirus (I, II, IV, and V) NOT DETECTED NOT DETECTED Final    Comment: Performed at [name], [address] [place], [place] [address]         Radiology Studies: No results found.      Scheduled Meds: . amiodarone  200 mg Oral Daily  . amitriptyline  25 mg Oral QHS  . loperamide  4 mg Oral BID  . melatonin  6 mg Oral Once  . metoprolol tartrate  12.5 mg Oral BID  . pantoprazole  40 mg Oral QAC breakfast  . pregabalin  100 mg Oral BID  . rosuvastatin  20 mg Oral q1800  . sodium bicarbonate  650 mg Oral BID   Continuous Infusions: . sodium chloride 20 mL/hr at 03/13/21 0553  . lactated ringers 75 mL/hr at 03/11/21 2227     LOS: 4 days    Time spent: 35 minutes    Brieanne Mignone Darleen Crocker, DO [name]  If 7PM-7AM, please contact night-coverage [email or web address] 03/13/2021, 10:52 AM \

## 2021-03-13 NOTE — Progress Notes (Signed)
We will proceed with EGD and colonoscopy as scheduled.  I thoroughly discussed with the patient his procedure, including the risks involved. Patient understands what the procedure involves including the benefits and any risks. Patient understands alternatives to the proposed procedure. Risks including (but not limited to) bleeding, tearing of the lining (perforation), rupture of adjacent organs, problems with heart and lung function, infection, and medication reactions. A small percentage of complications may require surgery, hospitalization, repeat endoscopic procedure, and/or transfusion.  Patient understood and agreed. ? ?Sahas Sluka Castaneda, MD ?Gastroenterology and Hepatology ?[facility] ? ?

## 2021-03-13 NOTE — Transfer of Care (Signed)
Immediate Anesthesia Transfer of Care Note  Patient: [name]  Procedure(s) Performed: COLONOSCOPY WITH PROPOFOL (N/A ) ESOPHAGOGASTRODUODENOSCOPY (EGD) WITH PROPOFOL (N/A ) BIOPSY POLYPECTOMY  Patient Location: PACU  Anesthesia Type:General  Level of Consciousness: drowsy  Airway & Oxygen Therapy: Patient Spontanous Breathing  Post-op Assessment: Report given to RN and Post -op Vital signs reviewed and stable  Post vital signs: Reviewed and stable  Last Vitals:  Vitals Value Taken Time  BP 92/42 03/13/21 1440  Temp    Pulse    Resp 16 03/13/21 1440  SpO2 100 % 03/13/21 1440    Last Pain:  Vitals:   03/13/21 1440  TempSrc: Oral  PainSc:       Patients Stated Pain Goal: 8 (01/75/10 2585)  Complications: No complications documented.

## 2021-03-13 NOTE — Progress Notes (Signed)
Patients BS was 57, given 12.5g of Dextrose via IV will recheck blood sugar

## 2021-03-13 NOTE — Plan of Care (Signed)

## 2021-03-13 NOTE — Progress Notes (Signed)
BS 102 after dextrose

## 2021-03-13 NOTE — Op Note (Signed)
[facility] Patient Name: [name] Procedure Date: 03/13/2021 11:36 AM MRN: [id number] Date of Birth: November 21, 1965 Attending MD: Maylon Peppers ,  CSN: [id number] Age: 55 Admit Type: Inpatient Procedure:                Colonoscopy Indications:              Clinically significant diarrhea of unexplained                            origin Providers:                Maylon Peppers, Rosina Lowenstein, RN, Nelma Rothman,                            Technician Referring MD:              Medicines:                Monitored Anesthesia Care Complications:            No immediate complications. Estimated Blood Loss:     Estimated blood loss: none. Procedure:                Pre-Anesthesia Assessment:                           - Prior to the procedure, a History and Physical                            was performed, and patient medications, allergies                            and sensitivities were reviewed. The patient's                            tolerance of previous anesthesia was reviewed.                           - The risks and benefits of the procedure and the                            sedation options and risks were discussed with the                            patient. All questions were answered and informed                            consent was obtained.                           - ASA Grade Assessment: III - A patient with severe                            systemic disease.                           After obtaining informed consent, the colonoscope  was passed under direct vision. Throughout the                            procedure, the patient's blood pressure, pulse, and                            oxygen saturations were monitored continuously.The                            colonoscopy was performed without difficulty. The                            patient tolerated the procedure well. The quality                            of the bowel preparation  was adequate to identify                            polyps 6 mm and larger in size. The [id number]-[id number]                            ([id number]) scope was introduced through the anus and                            advanced to the the terminal ileum. Scope In: 1:50:43 PM Scope Out: 1:02:58 PM Scope Withdrawal Time: 0 hours 38 minutes 43 seconds  Total Procedure Duration: 0 hours 45 minutes 36 seconds  Findings:      The terminal ileum appeared normal. Biopsies were taken with a cold       forceps for histology.      The perianal exam findings include presence of perianal scars and       erythema in perianal area.      Five multi-lobulated and sessile polyps were found in the ascending       colon. The polyps were 3 to 12 mm in size. These polyps were removed       with a cold snare. Resection and retrieval were complete. Two polyps       were removed in a piecemeal fashion.      Four sessile polyps were found in the descending colon and transverse       colon. The polyps were 4 to 8 mm in size. These polyps were removed with       a cold snare. Resection and retrieval were complete.      The rest of the colon appeared normal. Biopsies for histology were taken       with a cold forceps from the right colon and left colon for evaluation       of microscopic colitis.      The retroflexed view of the distal rectum and anal verge was normal and       showed no anal or rectal abnormalities. Impression:               - Presence of perianal scars and erythema in                            perianal  area found on perianal exam.                           - The examined portion of the ileum was normal.                            Biopsied.                           - Five 3 to 12 mm polyps in the ascending colon,                            removed with a cold snare. Resected and retrieved.                           - Four 4 to 8 mm polyps in the descending colon and                            in the  transverse colon, removed with a cold snare.                            Resected and retrieved.                           - The rest of the examined colon is normal.                            Biopsied.                           - The distal rectum and anal verge are normal on                            retroflexion view. Moderate Sedation:      Per Anesthesia Care Recommendation:           - Return patient to hospital ward for ongoing care.                           - Resume previous diet.                           - Await pathology results.                           - Repeat colonoscopy in 6 months for surveillance                            given piecemeal resection of polyp. Procedure Code(s):        --- Professional ---                           [phone], Colonoscopy, flexible; with removal of  tumor(s), polyp(s), or other lesion(s) by snare                            technique                           [id number], 62, Colonoscopy, flexible; with biopsy,                            single or multiple Diagnosis Code(s):        --- Professional ---                           K63.5, Polyp of colon                           R19.7, Diarrhea, unspecified CPT copyright 2019 American Medical Association. All rights reserved. The codes documented in this report are preliminary and upon coder review may  be revised to meet current compliance requirements. Maylon Peppers, MD Maylon Peppers,  03/13/2021 2:43:28 PM This report has been signed electronically. Number of Addenda: 0

## 2021-03-13 NOTE — Brief Op Note (Signed)
03/08/2021 - 03/13/2021  2:43 PM  PATIENT:  [name]  55 y.o. female  PRE-OPERATIVE DIAGNOSIS:  Acute on chronic diarrhea, chronic N/V  POST-OPERATIVE DIAGNOSIS:  GLO:VFIEP irregularity second portion of small bowel COLON:descending colon polyp, polyps at ascending colon x5, transverse polyps x3   PROCEDURE:  Procedure(s) with comments: COLONOSCOPY WITH PROPOFOL (N/A) - with random colonic biopsies ESOPHAGOGASTRODUODENOSCOPY (EGD) WITH PROPOFOL (N/A) BIOPSY POLYPECTOMY  SURGEON:  Surgeon(s) and Role:    * Harvel Quale, MD - Primary  Patient underwent EGD and colonoscopy today under propofol sedation.  Tolerated the procedure adequately.  Esophagogastroduodenospy showed normal esophagus and stomach, there was presence of a small nodule in the second portion of the duodenum.  The nodule was biopsied as well as the normal duodenum.  In her colonoscopy, she was found to have normal terminal ileum which was biopsied.  Also had presence of a total of 9 polyps (5 in the ascending colon, 3 in the transverse colon and one in the ascending colon between 4 and 12 mm in size) which were resected with a cold snare, 2 polyps were removed in a piecemeal fashion.  Random colonic biopsies were taken from the normal colon.  Normal retroflexion.  RECOMMENDATIONS - Return patient to hospital ward for ongoing care.  - Resume previous diet.  - Await pathology results.  - Repeat colonoscopy in 6 months for surveillance given piecemeal resection of polyp.  - Can restart AC tonight.  Maylon Peppers, MD Gastroenterology and Hepatology [facility]

## 2021-03-13 NOTE — Anesthesia Preprocedure Evaluation (Addendum)
Anesthesia Evaluation  Patient identified by MRN, date of birth, ID band Patient awake    Reviewed: Allergy & Precautions, NPO status , Patient's Chart, lab work & pertinent test results, reviewed documented beta blocker date and time   History of Anesthesia Complications (+) DIFFICULT AIRWAY and Family history of anesthesia reactionNegative for: history of anesthetic complications  Airway Mallampati: II  TM Distance: >3 FB Neck ROM: Full    Dental  (+) Dental Advisory Given, Poor Dentition, Chipped, Missing   Pulmonary asthma , Current Smoker and Patient abstained from smoking.,    Pulmonary exam normal breath sounds clear to auscultation       Cardiovascular Exercise Tolerance: Poor hypertension, Pt. on medications and Pt. on home beta blockers + CAD, + Past MI, + Peripheral Vascular Disease and +CHF  Normal cardiovascular exam Rhythm:Regular Rate:Normal  1. Global hypokinesis with more prominent anteroseptal and apical  hypokinesis. . Left ventricular ejection fraction, by estimation, is 35 to  40%. The left ventricle has moderately decreased function. The left  ventricle demonstrates regional wall motion  abnormalities (see scoring diagram/findings for description). Left  ventricular diastolic parameters are indeterminate.  2. Right ventricular systolic function is normal. The right ventricular  size is normal.  3. Left atrial size was severely dilated.  4. The pericardial effusion is circumferential.  5. The mitral valve is normal in structure. No evidence of mitral valve  regurgitation. No evidence of mitral stenosis.  6. The aortic valve has an indeterminant number of cusps. Aortic valve  regurgitation is not visualized. No aortic stenosis is present.  7. The inferior vena cava is normal in size with greater than 50%  respiratory variability, suggesting right atrial pressure of 3 mmHg.    Neuro/Psych PSYCHIATRIC  DISORDERS Depression  Neuromuscular disease    GI/Hepatic GERD  Medicated,  Endo/Other  diabetes, Poorly Controlled, Type 2, Insulin Dependent  Renal/GU Renal disease     Musculoskeletal  (+) Arthritis , Osteoarthritis,    Abdominal   Peds  Hematology  (+) anemia ,   Anesthesia Other Findings   Reproductive/Obstetrics                            Anesthesia Physical Anesthesia Plan  ASA: IV  Anesthesia Plan: General   Post-op Pain Management:    Induction: Intravenous  PONV Risk Score and Plan: Propofol infusion  Airway Management Planned: Nasal Cannula and Natural Airway  Additional Equipment:   Intra-op Plan:   Post-operative Plan: Possible Post-op intubation/ventilation  Informed Consent: I have reviewed the patients History and Physical, chart, labs and discussed the procedure including the risks, benefits and alternatives for the proposed anesthesia with the patient or authorized representative who has indicated his/her understanding and acceptance.     Dental advisory given  Plan Discussed with: CRNA and Surgeon  Anesthesia Plan Comments:        Anesthesia Quick Evaluation

## 2021-03-13 NOTE — Op Note (Signed)
[facility] Patient Name: [name] Procedure Date: 03/13/2021 11:34 AM MRN: [id number] Date of Birth: Apr 11, 1966 Attending MD: Maylon Peppers ,  CSN: [id number] Age: 55 Admit Type: Inpatient Procedure:                Upper GI endoscopy Indications:              Diarrhea Providers:                Maylon Peppers, Rosina Lowenstein, RN, Nelma Rothman,                            Technician Referring MD:              Medicines:                Monitored Anesthesia Care Complications:            No immediate complications. Estimated Blood Loss:     Estimated blood loss: none. Procedure:                Pre-Anesthesia Assessment:                           - Prior to the procedure, a History and Physical                            was performed, and patient medications, allergies                            and sensitivities were reviewed. The patient's                            tolerance of previous anesthesia was reviewed.                           - The risks and benefits of the procedure and the                            sedation options and risks were discussed with the                            patient. All questions were answered and informed                            consent was obtained.                           - ASA Grade Assessment: III - A patient with severe                            systemic disease.                           After obtaining informed consent, the endoscope was                            passed under direct vision. Throughout the  procedure, the patient's blood pressure, pulse, and                            oxygen saturations were monitored continuously.The                            upper GI endoscopy was accomplished without                            difficulty. The patient tolerated the procedure                            well. The [id number]) was introduced through                            the mouth, and advanced  to the second part of                            duodenum. Scope In: 1:39:13 PM Scope Out: 1:45:25 PM Total Procedure Duration: 0 hours 6 minutes 12 seconds  Findings:      The examined esophagus was normal.      The entire examined stomach was normal.      A single 5 mm mucosal nodule with a localized distribution was found in       the second portion of the duodenum. Biopsies were taken from the       nodularity and from the normal duodenum with a cold forceps for       histology. Impression:               - Normal esophagus.                           - Normal stomach.                           - Mucosal nodule found in the duodenum. Biopsied. Moderate Sedation:      Per Anesthesia Care Recommendation:           - Return patient to hospital ward for ongoing care.                           - Resume previous diet.                           - Await pathology results. Procedure Code(s):        --- Professional ---                           [phone], Esophagogastroduodenoscopy, flexible,                            transoral; with biopsy, single or multiple Diagnosis Code(s):        --- Professional ---                           K31.89, Other diseases of stomach and duodenum  R19.7, Diarrhea, unspecified CPT copyright 2019 American Medical Association. All rights reserved. The codes documented in this report are preliminary and upon coder review may  be revised to meet current compliance requirements. Maylon Peppers, MD Maylon Peppers,  03/13/2021 1:49:44 PM This report has been signed electronically. Number of Addenda: 0

## 2021-03-14 DIAGNOSIS — I5042 Chronic combined systolic (congestive) and diastolic (congestive) heart failure: Secondary | ICD-10-CM

## 2021-03-14 DIAGNOSIS — R1110 Vomiting, unspecified: Secondary | ICD-10-CM

## 2021-03-14 DIAGNOSIS — R1032 Left lower quadrant pain: Secondary | ICD-10-CM

## 2021-03-14 DIAGNOSIS — R112 Nausea with vomiting, unspecified: Secondary | ICD-10-CM

## 2021-03-14 DIAGNOSIS — R197 Diarrhea, unspecified: Secondary | ICD-10-CM | POA: Diagnosis not present

## 2021-03-14 DIAGNOSIS — E878 Other disorders of electrolyte and fluid balance, not elsewhere classified: Secondary | ICD-10-CM | POA: Diagnosis not present

## 2021-03-14 DIAGNOSIS — E876 Hypokalemia: Secondary | ICD-10-CM | POA: Diagnosis not present

## 2021-03-14 LAB — BASIC METABOLIC PANEL
Anion gap: 9 (ref 5–15)
BUN: 13 mg/dL (ref 6–20)
CO2: 22 mmol/L (ref 22–32)
Calcium: 7.6 mg/dL — ABNORMAL LOW (ref 8.9–10.3)
Chloride: 104 mmol/L (ref 98–111)
Creatinine, Ser: 1.35 mg/dL — ABNORMAL HIGH (ref 0.44–1.00)
GFR, Estimated: 46 mL/min — ABNORMAL LOW (ref >60)
Glucose, Bld: 87 mg/dL (ref 70–99)
Potassium: 4.4 mmol/L (ref 3.5–5.1)
Sodium: 135 mmol/L (ref 135–145)

## 2021-03-14 LAB — GLUCOSE, CAPILLARY
Glucose-Capillary: 154 mg/dL — ABNORMAL HIGH (ref 70–99)
Glucose-Capillary: 103 mg/dL — ABNORMAL HIGH (ref 70–99)
Glucose-Capillary: 123 mg/dL — ABNORMAL HIGH (ref 70–99)
Glucose-Capillary: 139 mg/dL — ABNORMAL HIGH (ref 70–99)
Glucose-Capillary: 139 mg/dL — ABNORMAL HIGH (ref 70–99)

## 2021-03-14 LAB — MAGNESIUM: Magnesium: 1.9 mg/dL (ref 1.7–2.4)

## 2021-03-14 MED ORDER — PHENOL 1.4 % MT LIQD
1.0000 | OROMUCOSAL | Status: DC | PRN
  Administered 2021-03-14: 1 via OROMUCOSAL
  Filled 2021-03-14: qty 177

## 2021-03-14 MED ORDER — RIVAROXABAN 20 MG PO TABS
20.0000 mg | ORAL_TABLET | Freq: Every day | ORAL | Status: DC
  Administered 2021-03-14 – 2021-03-15 (×2): 20 mg via ORAL
  Filled 2021-03-14 (×2): qty 1

## 2021-03-14 NOTE — Progress Notes (Signed)
PROGRESS NOTE    [name]  HGD:[id number] DOB: [birth year] DOA: 03/08/2021 PCP: Frazier Richards, MD   Brief Narrative:   [name] a 55 y.o.femalewith medical history significant foratrial fibrillation, systolic and diastolic CHF, diabetes mellitus, hypertension,bilateral above-knee amputation,CKD 4, NSTEMI. Patient was sent to the ED by her primary care provider(clinic in [place])reports of low potassium. Patient has had diarrhea over the past 4 -5weeks.Patient was admitted for severe electrolyte abnormalities in the setting of her home diarrhea.She continues to have persistent diarrhea for which GI is planning upper and lower endoscopy today.    Assessment & Plan:   Principal Problem:   Electrolyte abnormality Active Problems:   Peripheral vascular disease (HCC)   Chronic combined systolic (congestive) and diastolic (congestive) heart failure (HCC)   CKD stage 4 due to type 2 diabetes mellitus (HCC)   S/P bilateral above knee amputation (HCC)   Hypokalemia   Diarrhea   Non-intractable vomiting   Left lower quadrant abdominal pain   Severe electrolyte abnormalities-improved -Noted to have hypokalemia, hypomagnesemia, and hypocalcemia on admission -all have been replaced -Monitor on telemetry  Intractable diarrhea-ongoing -Likely related to lactose intolerance and pt has been consuming dairy -C. difficile study negative -GI panelnegative -Startedimodium 4mg  bid 4/23, but has not helped -Abdominal exam benign -Appreciate GI evaluation -EGD/colonoscopy on 4/26 was unrevealing -Celiac panel pending -TSH 2.88 -patient wants to advance diet, will try on soft foods  Atrial fibrillation-currently in sinus rhythm -Continue home amiodarone -Metoprolol to be held for SBP<100 -resume xarelto today  Chronic combined systolic and diastolic CHF-stable -Plan to hold torsemidefor now -Last 2D echocardiogram 11/2020 with LVEF 35-40%  Controlled  type 2 diabetes -Patient has not taken insulin in the last several days due to poor oral intake -Hemoglobin A1c 5.7% -Some episodes of hypoglycemia noted -Continue SSI  CKD stageIIIb -Currently stable  Peripheral vascular disease with bilateral above-knee amputations -Resume Lyrica 4/25, now increased to 100mg  BID to help with neuropathy  Prolonged QTC -Likely related to electrolyte abnormalities -repeat EKGimproved -Resumed amitryptiline 4/24   DVT prophylaxis:Xarelto Code Status:Full Family Communication:None at bedside, patient will call Disposition Plan: Status is: Inpatient  Remains inpatient appropriate because:Persistent severe electrolyte disturbances and IV treatments appropriate due to intensity of illness or inability to take PO   Dispo: The patient is from:Home Anticipated d/c is DQ:QIWL Patient currently is not medically stable to d/c. Difficult to place patient No   Skin Assessment:  I have examined the patient's skin and I agree with the wound assessment as performed by the wound care RN as outlined below:  Pressure Injury Stage III - Full thickness tissue loss. Subcutaneous fat may be visible but bone, tendon or muscle are NOT exposed. (Active)    Location: Ischial tuberosity  Location Orientation: Right  Staging: Stage III - Full thickness tissue loss. Subcutaneous fat may be visible but bone, tendon or muscle are NOT exposed.  Wound Description (Comments):   Present on Admission: Yes    Consultants:  GI  Procedures:  See below  Antimicrobials:  None  Subjective: No vomiting. Had 2 loose stools this morning  Objective: Vitals:   03/13/21 2040 03/13/21 2253 03/14/21 0442 03/14/21 1400  BP: 92/71 (!) 121/53 (!) 122/58 121/62  Pulse: 61 60 (!) 59 65  Resp: 18  20 19   Temp: 98.3 F (36.8 C)  (!) 97.4 F (36.3 C) 97.6 F (36.4 C)  TempSrc: Oral   Oral  SpO2: 99%  98%  98%  Weight:  Intake/Output Summary (Last 24 hours) at 03/14/2021 1655 Last data filed at 03/14/2021 1500 Gross per 24 hour  Intake 960 ml  Output --  Net 960 ml   Filed Weights   03/08/21 1026  Weight: 101.2 kg    Examination:  General exam: Alert, awake, oriented x 3 Respiratory system: Clear to auscultation. Respiratory effort normal. Cardiovascular system:RRR. No murmurs, rubs, gallops. Gastrointestinal system: Abdomen is nondistended, soft and nontender. No organomegaly or masses felt. Normal bowel sounds heard. Central nervous system: Alert and oriented. No focal neurological deficits. Extremities: right BKA, left AKA Skin: No rashes, lesions or ulcers Psychiatry: Judgement and insight appear normal. Mood & affect appropriate.      Data Reviewed: I have personally reviewed following labs and imaging studies  CBC: Recent Labs  Lab 03/08/21 1055 03/10/21 0415 03/11/21 0415 03/12/21 0508 03/13/21 0534  WBC 6.6 8.4 7.5 5.4 5.0  NEUTROABS 5.1  --   --   --   --   HGB 11.6* 9.9* 9.8* 9.7* 9.8*  HCT 37.7 32.0* 32.5* 32.1* 33.3*  MCV 82.5 82.5 84.6 84.3 84.5  PLT 120* 112* 113* 121* 937*   Basic Metabolic Panel: Recent Labs  Lab 03/10/21 0415 03/11/21 0415 03/12/21 0508 03/13/21 0534 03/14/21 0457  NA 136 135 134* 133* 135  K 3.1* 3.4* 4.6 4.6 4.4  CL 101 101 102 101 104  CO2 27 25 24  20* 22  GLUCOSE 147* 119* 75 67* 87  BUN 11 14 14 13 13   CREATININE 1.37* 1.40* 1.28* 1.17* 1.35*  CALCIUM 6.3* 6.8* 7.2* 7.5* 7.6*  MG 1.4* 1.8 1.6* 1.9 1.9   GFR: Estimated Creatinine Clearance: 56.9 mL/min (A) (by C-G formula based on SCr of 1.35 mg/dL (H)). Liver Function Tests: Recent Labs  Lab 03/08/21 1548  AST 12*  ALT 7  ALKPHOS 95  BILITOT 0.9  PROT 6.5  ALBUMIN 2.7*   No results for input(s): LIPASE, AMYLASE in the last 168 hours. No results for input(s): AMMONIA in the last 168 hours. Coagulation Profile: No results for input(s): INR, PROTIME  in the last 168 hours. Cardiac Enzymes: No results for input(s): CKTOTAL, CKMB, CKMBINDEX, TROPONINI in the last 168 hours. BNP (last 3 results) No results for input(s): PROBNP in the last 8760 hours. HbA1C: No results for input(s): HGBA1C in the last 72 hours. CBG: Recent Labs  Lab 03/13/21 1524 03/13/21 2038 03/14/21 0118 03/14/21 0724 03/14/21 1121  GLUCAP 82 153* 154* 103* 123*   Lipid Profile: No results for input(s): CHOL, HDL, LDLCALC, TRIG, CHOLHDL, LDLDIRECT in the last 72 hours. Thyroid Function Tests: No results for input(s): TSH, T4TOTAL, FREET4, T3FREE, THYROIDAB in the last 72 hours. Anemia Panel: Recent Labs    03/12/21 1035  FERRITIN 30  TIBC 188*  IRON 35   Sepsis Labs: No results for input(s): PROCALCITON, LATICACIDVEN in the last 168 hours.  Recent Results (from the past 240 hour(s))  SARS CORONAVIRUS 2 (TAT 6-24 HRS) Nasopharyngeal Nasopharyngeal Swab     Status: None   Collection Time: 03/08/21  1:49 PM   Specimen: Nasopharyngeal Swab  Result Value Ref Range Status   SARS Coronavirus 2 NEGATIVE NEGATIVE Final    Comment: (NOTE) SARS-CoV-2 target nucleic acids are NOT DETECTED.  The SARS-CoV-2 RNA is generally detectable in upper and lower respiratory specimens during the acute phase of infection. Negative results do not preclude SARS-CoV-2 infection, do not rule out co-infections with other pathogens, and should not be used as the sole basis for  treatment or other patient management decisions. Negative results must be combined with clinical observations, patient history, and epidemiological information. The expected result is Negative.  Fact Sheet for Patients: SugarRoll.be  Fact Sheet for Healthcare Providers: https://www.woods-mathews.com/  This test is not yet approved or cleared by the [place] FDA and  has been authorized for detection and/or diagnosis of SARS-CoV-2 by FDA under an  Emergency Use Authorization (EUA). This EUA will remain  in effect (meaning this test can be used) for the duration of the COVID-19 declaration under Se ction 564(b)(1) of the Act, 21 U.S.C. section 360bbb-3(b)(1), unless the authorization is terminated or revoked sooner.  Performed at [facility], [place] [address], [place], [place] [address]   C Difficile Quick Screen w PCR reflex     Status: None   Collection Time: 03/08/21  9:07 PM   Specimen: STOOL  Result Value Ref Range Status   C Diff antigen NEGATIVE NEGATIVE Final   C Diff toxin NEGATIVE NEGATIVE Final   C Diff interpretation No C. difficile detected.  Final    Comment: Performed at [facility], [address] [place], [place] [address]  Gastrointestinal Panel by PCR , Stool     Status: None   Collection Time: 03/08/21  9:07 PM   Specimen: STOOL  Result Value Ref Range Status   Campylobacter species NOT DETECTED NOT DETECTED Final   Plesimonas shigelloides NOT DETECTED NOT DETECTED Final   Salmonella species NOT DETECTED NOT DETECTED Final   Yersinia enterocolitica NOT DETECTED NOT DETECTED Final   Vibrio species NOT DETECTED NOT DETECTED Final   Vibrio cholerae NOT DETECTED NOT DETECTED Final   Enteroaggregative E coli (EAEC) NOT DETECTED NOT DETECTED Final   Enteropathogenic E coli (EPEC) NOT DETECTED NOT DETECTED Final   Enterotoxigenic E coli (ETEC) NOT DETECTED NOT DETECTED Final   Shiga like toxin producing E coli (STEC) NOT DETECTED NOT DETECTED Final   Shigella/Enteroinvasive E coli (EIEC) NOT DETECTED NOT DETECTED Final   Cryptosporidium NOT DETECTED NOT DETECTED Final   Cyclospora cayetanensis NOT DETECTED NOT DETECTED Final   Entamoeba histolytica NOT DETECTED NOT DETECTED Final   Giardia lamblia NOT DETECTED NOT DETECTED Final   Adenovirus F40/41 NOT DETECTED NOT DETECTED Final   Astrovirus NOT DETECTED NOT DETECTED Final   Norovirus GI/GII NOT DETECTED NOT DETECTED Final   Rotavirus A NOT DETECTED  NOT DETECTED Final   Sapovirus (I, II, IV, and V) NOT DETECTED NOT DETECTED Final    Comment: Performed at [facility], [address] [place], [place] [address]         Radiology Studies: No results found.      Scheduled Meds: . amiodarone  200 mg Oral Daily  . amitriptyline  25 mg Oral QHS  . liver oil-zinc oxide   Topical Daily  . loperamide  4 mg Oral BID  . melatonin  6 mg Oral Once  . metoprolol tartrate  12.5 mg Oral BID  . pantoprazole  40 mg Oral QAC breakfast  . pregabalin  100 mg Oral BID  . rivaroxaban  20 mg Oral Q supper  . rosuvastatin  20 mg Oral q1800  . sodium bicarbonate  650 mg Oral BID   Continuous Infusions: . lactated ringers 75 mL/hr at 03/11/21 2227     LOS: 5 days    Time spent: 35 minutes    Kathie Dike, MD [name]  If 7PM-7AM, please contact night-coverage [email or web address] 03/14/2021, 4:55 PM \

## 2021-03-14 NOTE — Progress Notes (Signed)
Subjective: Denies N/V or abdominal pain this morning. Still having diarrhea (2 stools so far today, improved with Imodium). Denies any other overt GI complaints.  Objective: Vital signs in last 24 hours: Temp:  [97.4 F (36.3 C)-98.3 F (36.8 C)] 97.4 F (36.3 C) (04/27 0442) Pulse Rate:  [59-61] 59 (04/27 0442) Resp:  [13-20] 20 (04/27 0442) BP: (92-136)/(42-71) 122/58 (04/27 0442) SpO2:  [95 %-100 %] 98 % (04/27 0442) Last BM Date: (P) 03/13/21 General:   Alert and oriented, pleasant. Head:  Normocephalic and atraumatic. Eyes:  No icterus, sclera clear. Conjuctiva pink.  Heart:  S1, S2 present, no murmurs noted.  Lungs: Clear to auscultation bilaterally, without wheezing, rales, or rhonchi.  Abdomen:  Bowel sounds present, soft, non-tender, non-distended. No HSM or hernias noted. No rebound or guarding. No masses appreciated  Msk:  No obvious deformity. Pulses:  Bilateral DP pulses noted. Extremities:  Without clubbing or edema. Neurologic:  Alert and  oriented x4;  grossly normal neurologically. Psych:  Alert and cooperative. Normal mood and affect.  Intake/Output from previous day: 04/26 0701 - 04/27 0700 In: 3664.7 [P.O.:240; I.V.:3424.7] Out: 0  Intake/Output this shift: No intake/output data recorded.  Lab Results: Recent Labs    03/12/21 0508 03/13/21 0534  WBC 5.4 5.0  HGB 9.7* 9.8*  HCT 32.1* 33.3*  PLT 121* 147*   BMET Recent Labs    03/12/21 0508 03/13/21 0534 03/14/21 0457  NA 134* 133* 135  K 4.6 4.6 4.4  CL 102 101 104  CO2 24 20* 22  GLUCOSE 75 67* 87  BUN 14 13 13   CREATININE 1.28* 1.17* 1.35*  CALCIUM 7.2* 7.5* 7.6*   LFT No results for input(s): PROT, ALBUMIN, AST, ALT, ALKPHOS, BILITOT, BILIDIR, IBILI in the last 72 hours. PT/INR No results for input(s): LABPROT, INR in the last 72 hours. Hepatitis Panel No results for input(s): HEPBSAG, HCVAB, HEPAIGM, HEPBIGM in the last 72 hours.   Studies/Results: No results  found.  Assessment: 55 year old female admitted for severe electrolyte abnormalities in setting of chronic diarrhea, nausea and vomiting, with diarrhea worsening in the past few weeks. Cdiff and GI pathogen panel negative this admission. TSH normal. Celiac serologies remain in process. History of afib on Xarelto, held for procedures yesterday and recommended restart last night.   Diarrhea- Noted worsening with fiber. Multiple loose stools previous evening. Has only had 2 loose stools today, Imodium seems to be helping. Colonoscopy completed yesterday which found a total of 9 polyps status post resection, normal terminal ileum status post biopsy and otherwise normal colon status post random colon biopsies for evaluation of possible microscopic colitis.  She is recommended to have repeat colonoscopy in 6 months.  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 Also notes lactose intolerance with *some* dairy products.   Abdominal Pain- Previously noted associated LLQ pain with diarrhea, abd exam has been benign. Today she states no abdominal pain this morning. Holding off on imaging unless worsening. Abdominal exam again benign today   N/V- chronic. A1c 5.6 this admission. EGD completed yesterday and essentially unremarkable. She has denied postprandial abdominal pain but has noted early satiety. Pill dysphagia only and no solid food dysphagia. No N/V this morning. Is asking for solid diet.  Anemia- chronic and Hgb near baseline. Likely due to chronic disease. Updated iron studies with normal ferritin, iron, saturation.   Plan: 1. She will need repeat colonoscopy in 6 months (October/November 2022) 2.  Monitor for worsening diarrhea  3. Continue Imodium for now 4. Follow for path results 5. If no microscopic colitis or celiac disease, consider treatment for IBS and/or bile salt diarrhea overlay 6. Ok with advance to soft diet;  avoid greasy, spicy foods for now (stick to bland) 7. Supportive care 8. Anticipate discharge in the next 24-48 hours   Thank you for allowing [place] to participate in the care of [place], DNP, AGNP-C Adult & Gerontological Nurse Practitioner [facility]    LOS: 5 days    03/14/2021, 8:11 AM

## 2021-03-14 NOTE — Plan of Care (Signed)

## 2021-03-15 ENCOUNTER — Encounter (HOSPITAL_COMMUNITY): Payer: Self-pay | Admitting: Gastroenterology

## 2021-03-15 DIAGNOSIS — I5042 Chronic combined systolic (congestive) and diastolic (congestive) heart failure: Secondary | ICD-10-CM | POA: Diagnosis not present

## 2021-03-15 DIAGNOSIS — R197 Diarrhea, unspecified: Secondary | ICD-10-CM | POA: Diagnosis not present

## 2021-03-15 DIAGNOSIS — E878 Other disorders of electrolyte and fluid balance, not elsewhere classified: Secondary | ICD-10-CM | POA: Diagnosis not present

## 2021-03-15 DIAGNOSIS — E876 Hypokalemia: Secondary | ICD-10-CM | POA: Diagnosis not present

## 2021-03-15 LAB — GLUCOSE, CAPILLARY
Glucose-Capillary: 100 mg/dL — ABNORMAL HIGH (ref 70–99)
Glucose-Capillary: 108 mg/dL — ABNORMAL HIGH (ref 70–99)
Glucose-Capillary: 95 mg/dL (ref 70–99)
Glucose-Capillary: 115 mg/dL — ABNORMAL HIGH (ref 70–99)

## 2021-03-15 LAB — RENAL FUNCTION PANEL
Albumin: 1.9 g/dL — ABNORMAL LOW (ref 3.5–5.0)
Anion gap: 5 (ref 5–15)
BUN: 12 mg/dL (ref 6–20)
CO2: 25 mmol/L (ref 22–32)
Calcium: 7.6 mg/dL — ABNORMAL LOW (ref 8.9–10.3)
Chloride: 106 mmol/L (ref 98–111)
Creatinine, Ser: 1.57 mg/dL — ABNORMAL HIGH (ref 0.44–1.00)
GFR, Estimated: 38 mL/min — ABNORMAL LOW (ref >60)
Glucose, Bld: 88 mg/dL (ref 70–99)
Phosphorus: 3.6 mg/dL (ref 2.5–4.6)
Potassium: 4.7 mmol/L (ref 3.5–5.1)
Sodium: 136 mmol/L (ref 135–145)

## 2021-03-15 LAB — ALBUMIN: Albumin: 1.9 g/dL — ABNORMAL LOW (ref 3.5–5.0)

## 2021-03-15 LAB — SURGICAL PATHOLOGY

## 2021-03-15 LAB — VITAMIN B12: Vitamin B-12: 210 pg/mL (ref 180–914)

## 2021-03-15 NOTE — Progress Notes (Signed)
Patient requires frequent re-positioning of the body in ways that cannot be achieved with an ordinary bed or wedge pillow, to eliminate pain, reduce pressure, and the head of the bed to be elevated more than 30 degrees most of the time due to prior history of CHF and shortness of breath   [name]

## 2021-03-15 NOTE — Progress Notes (Signed)
Subjective:  Feels better. Diarrhea persistent but less. Tolerating diet. No abdominal pain. 3 stools in the last 24 hours. She reports lactose intolerance but loves her milk. Ice cream bothers her. Tolerates cottage cheese and some other cheeses.   Objective: Vital signs in last 24 hours: Temp:  [97.6 F (36.4 C)-99.3 F (37.4 C)] 98.5 F (36.9 C) (04/28 0532) Pulse Rate:  [65-75] 75 (04/28 0532) Resp:  [19-20] 20 (04/28 0532) BP: (121-139)/(62-65) 131/65 (04/28 0532) SpO2:  [94 %-98 %] 94 % (04/28 0532) Last BM Date: 03/15/21 General:   Alert,  Well-developed, well-nourished, pleasant and cooperative in NAD Head:  Normocephalic and atraumatic. Eyes:  Sclera clear, no icterus.  Abdomen:  Soft, nontender and nondistended.Normal bowel sounds, without guarding, and without rebound.   Extremities:  Bilateral AKA.  Neurologic:  Alert and  oriented x4;  grossly normal neurologically. Skin:  Intact without significant lesions or rashes. Psych:  Alert and cooperative. Normal mood and affect.  Intake/Output from previous day: 04/27 0701 - 04/28 0700 In: 1450 [P.O.:1450] Out: -  Intake/Output this shift: No intake/output data recorded.  Lab Results: CBC Recent Labs    03/13/21 0534  WBC 5.0  HGB 9.8*  HCT 33.3*  MCV 84.5  PLT 147*   BMET Recent Labs    03/13/21 0534 03/14/21 0457 03/15/21 0419  NA 133* 135 136  K 4.6 4.4 4.7  CL 101 104 106  CO2 20* 22 25  GLUCOSE 67* 87 88  BUN 13 13 12   CREATININE 1.17* 1.35* 1.57*  CALCIUM 7.5* 7.6* 7.6*   LFTs Recent Labs    03/15/21 0419  ALBUMIN 1.9*  1.9*   Lab Results  Component Value Date   TSH 2.889 03/11/2021    Lab Results  Component Value Date   VITAMINB12 210 03/15/2021     No results for input(s): LIPASE in the last 72 hours. PT/INR No results for input(s): LABPROT, INR in the last 72 hours.    Imaging Studies: No results found.[2 weeks]   Assessment: 55 year old female admitted for severe  electrolyte abnormalities in setting of chronic diarrhea, nausea and vomiting, with diarrhea worsening in the past few weeks. Cdiff and GI pathogen panel negative this admission. TSH normal. Celiac serologies remain in process. History of afib on Xarelto, held for procedures yesterday and recommended restart last night.  Diarrhea: acute on chronic symptoms. Colonoscopy completed this admission. Total of 9 polyps removed, normal TI, random colon biopsies. Path pending. Will need repeat colonoscopy in six months. Celiac serologies pending. Stool studies negative. She notes lactose intolerance to milk and ice cream but she continues to consume especially milk. She is willing to try lactose free products.  Etiology may be multifactorial.  Cannot rule out malabsorptive process given low normal B12, low albumin.  IBS overlay, bile salt diarrhea, microscopic colitis all possibilities as well.  N/V: chronic.  Positive early satiety.  A1c this admission 5.6.  EGD essentially unremarkable. No problems at this time. May have underlying gastroparesis.   Abdominal pain: previously noted LLQ with diarrhea. Pain has improved. She notes LLQ pain prior to BMs. Abdominal exam remains benign.   Anemia: chronic and at baseline. Like anemia of chronic disease based on labs. She has LOW NORMAL B12 and would likely benefit from oral supplement.     Plan: 1. Repeat colonoscopy in 6 months given piecemeal resection of polyp . 2. Follow-up pending path. 3. Continue Imodium for now. 4. Follow-up celiac serologies. 5. Recommend avoiding dairy given  lactose intolerance.  Discussed trying lactose-free products/substitutes. 6. Consider oral B12.   Laureen Ochs. Bernarda Caffey [facility] [phone] 4/28/20221:18 PM     LOS: 6 days

## 2021-03-15 NOTE — TOC Initial Note (Signed)
Transition of Care [facility]) - Initial/Assessment Note    Patient Details  Name: [name] MRN: [id number] Date of Birth: 29-Oct-1966  Transition of Care [facility]) CM/SW Contact:    Shade Flood, LCSW Phone Number: 03/15/2021, 3:30 PM  Clinical Narrative:                  Pt admitted from home. Received TOC consult to assess pt for needs at home. Met with pt at bedside today. Per pt, she lives with her husband and father in law at home. Pt is a bilateral amputee. She states that her husband "works all the time" and is never home. Pt's father in law cannot assist with her care. Pt states that she lays in bed all day. She uses a bed pan for toileting. Per pt, she has a manual hospital bed that she received from a family friend and that it is stuck at it's highest position and she cannot get out of bed to her manual wheelchair due to the height of it. Pt asking if TOC can assist in getting her an electric hospital bed.   Pt did inform this LCSW that she eats one meal a day and it is usually broth. Discussed Meals on Wheels though pt states she has spoken with [facility] and was informed that she is not eligible due to age. Pt states if she could get a new bed, she could get up and get her own food and take care of herself.   Asked pt if she has applied for Medicaid and she stated that she had but that she was not eligible due to household income. Asked pt if she is receiving disability and she stated that she is on disability leave from her job. Referred pt to [facility] financial counselor for investigation of whether pt may qualify for Disability Medicaid.   Discussed HH services at dc and pt agreeable. Pt states she had [facility] in the past and would like them again. Requested HH RN, PT and SW from MD.   Referred hospital bed order to [facility] and they will contact pt to discuss co-pay and delivery options.  Anticipating dc in 1-2 days.   Assigned TOC will follow.  Expected Discharge Plan:  [place] Barriers to Discharge: Continued Medical Work up   Patient Goals and CMS Choice Patient states their goals for this hospitalization and ongoing recovery are:: go home CMS Medicare.gov Compare Post Acute Care list provided to:: Patient Choice offered to / list presented to : Patient  Expected Discharge Plan and Services Expected Discharge Plan: [place] In-house Referral: Clinical Social Work   Post Acute Care Choice: Durable Medical [place] arrangements for the past 2 months: [place]                 DME Arranged: Hospital bed DME Agency: [facility] Date DME Agency Contacted: 03/15/21   Representative spoke with at DME Agency: [place]: RN,PT,Social Work [place]: [place] ([place]) Date [place]: 03/15/21   Representative spoke with at [place]: Vaughan Basta  Prior Living Arrangements/Services Living arrangements for the past 2 months: [place] with:: Spouse,Relatives Patient language and need for interpreter reviewed:: Yes Do you feel safe going back to the place where you live?: Yes      Need for Family Participation in Patient Care: Yes (Comment) Care giver support system in place?: Yes (comment) Current home services:  DME Criminal Activity/Legal Involvement Pertinent to Current Situation/Hospitalization: No - Comment as needed  Activities of Daily Living Home Assistive Devices/Equipment: Wheelchair ADL Screening (condition at time of admission) Patient's cognitive ability adequate to safely complete daily activities?: Yes Is the patient deaf or have difficulty hearing?: No Does the patient have difficulty seeing, even when wearing glasses/contacts?: Yes Does the patient have difficulty concentrating, remembering, or making decisions?: No Patient able to express need for assistance with ADLs?: Yes Does the patient have difficulty dressing or  bathing?: Yes Independently performs ADLs?: No Communication: Independent Dressing (OT): Needs assistance Is this a change from baseline?: Pre-admission baseline Grooming: Needs assistance Is this a change from baseline?: Pre-admission baseline Feeding: Independent Bathing: Dependent Is this a change from baseline?: Pre-admission baseline Toileting: Dependent Is this a change from baseline?: Pre-admission baseline In/Out Bed: Dependent Is this a change from baseline?: Pre-admission baseline Walks in Home: Needs assistance Is this a change from baseline?: Pre-admission baseline Does the patient have difficulty walking or climbing stairs?: Yes Weakness of Legs: None Weakness of Arms/Hands: None  Permission Sought/Granted Permission sought to share information with : Chartered certified accountant granted to share information with : Yes, Verbal Permission Granted     Permission granted to share info w AGENCY: HH, DME        Emotional Assessment   Attitude/Demeanor/Rapport: Engaged Affect (typically observed): Pleasant Orientation: : Oriented to Self,Oriented to Place,Oriented to  Time,Oriented to Situation Alcohol / Substance Use: Not Applicable Psych Involvement: No (comment)  Admission diagnosis:  Hypocalcemia [E83.51] Hypokalemia [E87.6] Hypomagnesemia [E83.42] Prolonged Q-T interval on ECG [R94.31] Electrolyte abnormality [E87.8] Patient Active Problem List   Diagnosis Date Noted  . Diarrhea   . Non-intractable vomiting   . Left lower quadrant abdominal pain   . Hypokalemia 03/09/2021  . Electrolyte abnormality 03/08/2021  . S/P bilateral above knee amputation ([place]) 03/08/2021  . 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 CKD stage 4 due to type 2 diabetes mellitus ([place]) 12/05/2020  . Hyperlipidemia associated with type 2 diabetes mellitus ([place]) 12/05/2020  . Controlled type 2 diabetes mellitus with stage 4  chronic kidney disease, with long-term current use of insulin ([place]) 12/05/2020  . S/P BKA (below knee amputation) unilateral, right ([place]) 12/05/2020  . Anemia due to stage 4 chronic kidney disease ([place]) 12/05/2020  . Leukopenia 11/29/2020  . Elevated brain natriuretic peptide (BNP) level 11/29/2020  . Elevated d-dimer 11/29/2020  . Prolonged QT interval 11/29/2020  . Stable treated proliferative diabetic retinopathy of left eye determined by examination associated with type 2 diabetes mellitus ([place]) 08/14/2020  . Macular pucker, left eye 02/24/2020  . Follow-up examination after eye surgery 02/24/2020  . Coronary artery disease involving native coronary artery of native heart without angina pectoris 11/21/2018  . Ischemic cardiomyopathy 11/21/2018  . Essential hypertension 11/21/2018  . Labile blood pressure   . Diabetic retinopathy associated with type 2 diabetes mellitus ([place])   . Hypoalbuminemia due to protein-calorie malnutrition ([place])   . Type 2 diabetes mellitus with peripheral neuropathy (HCC)   . Chronic combined systolic (congestive) and diastolic (congestive) heart failure ([place])   . Unilateral AKA, left ([place])   . Atrial fibrillation with rapid ventricular response ([place])   . Diabetic peripheral neuropathy ([place])   . Anemia of chronic disease   . Gastroesophageal reflux disease   . Non-ST elevation (NSTEMI) myocardial infarction ([place])   . Acute combined systolic and diastolic heart failure ([place])   .  Acute respiratory failure with hypoxia ([place])   . Microcytic anemia 04/25/2018  . Pulmonary nodule 04/25/2018  . Gangrene of lower extremity ([place]) 04/25/2018  . Femoral-popliteal bypass graft occlusion, left ([place]) 12/02/2017  . Respiratory failure with hypoxia ([place]) 12/02/2017  . Leukocytosis 12/02/2017  . Paroxysmal atrial fibrillation with rapid ventricular response ([place]) 12/02/2017  . Hyperkalemia 06/26/2017  . Elevated lactic acid level   . Chronic back pain 11/27/2016  .  Hyperlipidemia LDL goal <70   . Retinal tumor of right eye   . 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 Paresthesia of both hands 03/05/2016  . DDD (degenerative disc disease), lumbosacral 03/26/2015  . DDD (degenerative disc disease), cervical 03/26/2015  . Sacroiliac joint disease 03/26/2015  . Occipital neuralgia 03/26/2015  . Pain in limb-Bilateral Leg 04/25/2014  . Peripheral vascular disease ([place]) 04/05/2013  . 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 [address] Phone: [phone] Fax: [phone]  [facility] - [place], [place] - [address] [place] [address] Phone: [phone] Fax: [phone]     Social Determinants of Health (SDOH) Interventions    Readmission Risk Interventions Readmission Risk Prevention Plan 03/15/2021 12/02/2020 11/30/2020  Transportation Screening Complete Complete Complete  PCP or Specialist Appt within 5-7 Days - - -  PCP or Specialist Appt within 3-5 Days - Complete -  Home Care Screening - - -  Medication Review (RN CM) - - -  [place] or [place] - Complete Complete  Social Work Consult for [place] Planning/Counseling - Complete Complete  Palliative Care Screening - Not Applicable Not Applicable  Medication Review Press photographer) Complete Complete Complete  HRI or Home Care Consult Complete - -  SW Recovery Care/Counseling Consult Complete - -  Palliative Care Screening Not Applicable - -  [place] Not Applicable - -  Some recent data might be hidden

## 2021-03-15 NOTE — Progress Notes (Signed)
PROGRESS NOTE    [name]  TKZ:[id number] DOB: 1966-08-12 DOA: 03/08/2021 PCP: Frazier Richards, MD   Brief Narrative:   [name] a 55 y.o.femalewith medical history significant foratrial fibrillation, systolic and diastolic CHF, diabetes mellitus, hypertension,bilateral above-knee amputation,CKD 4, NSTEMI. Patient was sent to the ED by her primary care provider(clinic in [place])reports of low potassium. Patient has had diarrhea over the past 4 -5weeks.Patient was admitted for severe electrolyte abnormalities in the setting of her home diarrhea.She continues to have persistent diarrhea for which GI is planning upper and lower endoscopy today.    Assessment & Plan:   Principal Problem:   Electrolyte abnormality Active Problems:   Peripheral vascular disease (HCC)   Chronic combined systolic (congestive) and diastolic (congestive) heart failure (HCC)   CKD stage 4 due to type 2 diabetes mellitus (HCC)   S/P bilateral above knee amputation (HCC)   Hypokalemia   Diarrhea   Non-intractable vomiting   Left lower quadrant abdominal pain   Severe electrolyte abnormalities-improved -Noted to have hypokalemia, hypomagnesemia, and hypocalcemia on admission -all have been replaced -Monitor on telemetry  Intractable diarrhea-ongoing -Likely related to lactose intolerance and pt has been consuming dairy -C. difficile study negative -GI panelnegative -Startedimodium 4mg  bid 4/23, but has not helped -Abdominal exam benign -Appreciate GI evaluation -EGD/colonoscopy on 4/26 was unrevealing -Celiac panel pending -TSH 2.88 -tolerating soft diet  Atrial fibrillation-currently in sinus rhythm -Continue home amiodarone -Metoprolol to be held for SBP<100 -anticoagulated with xarelto  Chronic combined systolic and diastolic CHF-stable -Plan to hold torsemidefor now -Last 2D echocardiogram 11/2020 with LVEF 35-40%  Controlled type 2 diabetes -Patient  has not taken insulin in the last several days due to poor oral intake -Hemoglobin A1c 5.7% -Some episodes of hypoglycemia noted -Continue SSI  AKI on CKD stageIIIb -creatinine has been trending up to 1.5 -continue iv hydration -check renal ultrasound  Peripheral vascular disease with bilateral above-knee amputations -Resume Lyrica 4/25, now increased to 100mg  BID to help with neuropathy  Prolonged QTC -Likely related to electrolyte abnormalities -repeat EKGimproved -Resumed amitryptiline 4/24   DVT prophylaxis:Xarelto Code Status:Full Family Communication:None at bedside, patient will call Disposition Plan: Status is: Inpatient  Remains inpatient appropriate because:Persistent severe electrolyte disturbances and IV treatments appropriate due to intensity of illness or inability to take PO   Dispo: The patient is from:Home Anticipated d/c is TD:DUKG Patient currently is not medically stable to d/c. Difficult to place patient No   Skin Assessment:  I have examined the patient's skin and I agree with the wound assessment as performed by the wound care RN as outlined below:  Pressure Injury Stage III - Full thickness tissue loss. Subcutaneous fat may be visible but bone, tendon or muscle are NOT exposed. (Active)    Location: Ischial tuberosity  Location Orientation: Right  Staging: Stage III - Full thickness tissue loss. Subcutaneous fat may be visible but bone, tendon or muscle are NOT exposed.  Wound Description (Comments):   Present on Admission: Yes    Consultants:  GI  Procedures:  See below  Antimicrobials:  None  Subjective: Reports continued loose stools, tolerating solid food  Objective: Vitals:   03/14/21 0442 03/14/21 1400 03/14/21 2115 03/15/21 0532  BP: (!) 122/58 121/62 139/63 131/65  Pulse: (!) 59 65 73 75  Resp: 20 19 20 20   Temp: (!) 97.4 F (36.3 C) 97.6 F (36.4  C) 99.3 F (37.4 C) 98.5 F (36.9 C)  TempSrc:  Oral  SpO2: 98% 98% 95% 94%  Weight:        Intake/Output Summary (Last 24 hours) at 03/15/2021 1915 Last data filed at 03/15/2021 1837 Gross per 24 hour  Intake 970 ml  Output 4 ml  Net 966 ml   Filed Weights   03/08/21 1026  Weight: 101.2 kg    Examination:  General exam: Alert, awake, oriented x 3 Respiratory system: Clear to auscultation. Respiratory effort normal. Cardiovascular system:RRR. No murmurs, rubs, gallops. Gastrointestinal system: Abdomen is nondistended, soft and nontender. No organomegaly or masses felt. Normal bowel sounds heard. Central nervous system: Alert and oriented. No focal neurological deficits. Extremities: right BKA, left AKA Skin: No rashes, lesions or ulcers Psychiatry: Judgement and insight appear normal. Mood & affect appropriate.      Data Reviewed: I have personally reviewed following labs and imaging studies  CBC: Recent Labs  Lab 03/10/21 0415 03/11/21 0415 03/12/21 0508 03/13/21 0534  WBC 8.4 7.5 5.4 5.0  HGB 9.9* 9.8* 9.7* 9.8*  HCT 32.0* 32.5* 32.1* 33.3*  MCV 82.5 84.6 84.3 84.5  PLT 112* 113* 121* 017*   Basic Metabolic Panel: Recent Labs  Lab 03/10/21 0415 03/11/21 0415 03/12/21 0508 03/13/21 0534 03/14/21 0457 03/15/21 0419  NA 136 135 134* 133* 135 136  K 3.1* 3.4* 4.6 4.6 4.4 4.7  CL 101 101 102 101 104 106  CO2 27 25 24  20* 22 25  GLUCOSE 147* 119* 75 67* 87 88  BUN 11 14 14 13 13 12   CREATININE 1.37* 1.40* 1.28* 1.17* 1.35* 1.57*  CALCIUM 6.3* 6.8* 7.2* 7.5* 7.6* 7.6*  MG 1.4* 1.8 1.6* 1.9 1.9  --   PHOS  --   --   --   --   --  3.6   GFR: Estimated Creatinine Clearance: 48.9 mL/min (A) (by C-G formula based on SCr of 1.57 mg/dL (H)). Liver Function Tests: Recent Labs  Lab 03/15/21 0419  ALBUMIN 1.9*  1.9*   No results for input(s): LIPASE, AMYLASE in the last 168 hours. No results for input(s): AMMONIA in the last 168 hours. Coagulation  Profile: No results for input(s): INR, PROTIME in the last 168 hours. Cardiac Enzymes: No results for input(s): CKTOTAL, CKMB, CKMBINDEX, TROPONINI in the last 168 hours. BNP (last 3 results) No results for input(s): PROBNP in the last 8760 hours. HbA1C: No results for input(s): HGBA1C in the last 72 hours. CBG: Recent Labs  Lab 03/14/21 1727 03/14/21 2015 03/15/21 0728 03/15/21 1057 03/15/21 1606  GLUCAP 139* 139* 100* 108* 95   Lipid Profile: No results for input(s): CHOL, HDL, LDLCALC, TRIG, CHOLHDL, LDLDIRECT in the last 72 hours. Thyroid Function Tests: No results for input(s): TSH, T4TOTAL, FREET4, T3FREE, THYROIDAB in the last 72 hours. Anemia Panel: Recent Labs    03/15/21 0419  VITAMINB12 210   Sepsis Labs: No results for input(s): PROCALCITON, LATICACIDVEN in the last 168 hours.  Recent Results (from the past 240 hour(s))  SARS CORONAVIRUS 2 (TAT 6-24 HRS) Nasopharyngeal Nasopharyngeal Swab     Status: None   Collection Time: 03/08/21  1:49 PM   Specimen: Nasopharyngeal Swab  Result Value Ref Range Status   SARS Coronavirus 2 NEGATIVE NEGATIVE Final    Comment: (NOTE) SARS-CoV-2 target nucleic acids are NOT DETECTED.  The SARS-CoV-2 RNA is generally detectable in upper and lower respiratory specimens during the acute phase of infection. Negative results do not preclude SARS-CoV-2 infection, do not rule out co-infections with other pathogens, and should not be used  as the sole basis for treatment or other patient management decisions. Negative results must be combined with clinical observations, patient history, and epidemiological information. The expected result is Negative.  Fact Sheet for Patients: SugarRoll.be  Fact Sheet for Healthcare Providers: https://www.woods-mathews.com/  This test is not yet approved or cleared by the [place] FDA and  has been authorized for detection and/or diagnosis of  SARS-CoV-2 by FDA under an Emergency Use Authorization (EUA). This EUA will remain  in effect (meaning this test can be used) for the duration of the COVID-19 declaration under Se ction 564(b)(1) of the Act, 21 U.S.C. section 360bbb-3(b)(1), unless the authorization is terminated or revoked sooner.  Performed at [facility], [place] [address] [place], [place] [address]   C Difficile Quick Screen w PCR reflex     Status: None   Collection Time: 03/08/21  9:07 PM   Specimen: STOOL  Result Value Ref Range Status   C Diff antigen NEGATIVE NEGATIVE Final   C Diff toxin NEGATIVE NEGATIVE Final   C Diff interpretation No C. difficile detected.  Final    Comment: Performed at [facility], [address], [place], [place] [address]  Gastrointestinal Panel by PCR , Stool     Status: None   Collection Time: 03/08/21  9:07 PM   Specimen: STOOL  Result Value Ref Range Status   Campylobacter species NOT DETECTED NOT DETECTED Final   Plesimonas shigelloides NOT DETECTED NOT DETECTED Final   Salmonella species NOT DETECTED NOT DETECTED Final   Yersinia enterocolitica NOT DETECTED NOT DETECTED Final   Vibrio species NOT DETECTED NOT DETECTED Final   Vibrio cholerae NOT DETECTED NOT DETECTED Final   Enteroaggregative E coli (EAEC) NOT DETECTED NOT DETECTED Final   Enteropathogenic E coli (EPEC) NOT DETECTED NOT DETECTED Final   Enterotoxigenic E coli (ETEC) NOT DETECTED NOT DETECTED Final   Shiga like toxin producing E coli (STEC) NOT DETECTED NOT DETECTED Final   Shigella/Enteroinvasive E coli (EIEC) NOT DETECTED NOT DETECTED Final   Cryptosporidium NOT DETECTED NOT DETECTED Final   Cyclospora cayetanensis NOT DETECTED NOT DETECTED Final   Entamoeba histolytica NOT DETECTED NOT DETECTED Final   Giardia lamblia NOT DETECTED NOT DETECTED Final   Adenovirus F40/41 NOT DETECTED NOT DETECTED Final   Astrovirus NOT DETECTED NOT DETECTED Final   Norovirus GI/GII NOT DETECTED NOT DETECTED Final    Rotavirus A NOT DETECTED NOT DETECTED Final   Sapovirus (I, II, IV, and V) NOT DETECTED NOT DETECTED Final    Comment: Performed at [facility], [address] [place], [place] [address]         Radiology Studies: No results found.      Scheduled Meds: . amiodarone  200 mg Oral Daily  . amitriptyline  25 mg Oral QHS  . liver oil-zinc oxide   Topical Daily  . loperamide  4 mg Oral BID  . melatonin  6 mg Oral Once  . metoprolol tartrate  12.5 mg Oral BID  . pantoprazole  40 mg Oral QAC breakfast  . pregabalin  100 mg Oral BID  . rivaroxaban  20 mg Oral Q supper  . rosuvastatin  20 mg Oral q1800  . sodium bicarbonate  650 mg Oral BID   Continuous Infusions: . lactated ringers 75 mL/hr at 03/11/21 2227     LOS: 6 days    Time spent: 35 minutes    Kathie Dike, MD [name]  If 7PM-7AM, please contact night-coverage [email or web address]  03/15/2021, 7:15 PM \

## 2021-03-16 ENCOUNTER — Encounter: Payer: Self-pay | Admitting: *Deleted

## 2021-03-16 ENCOUNTER — Inpatient Hospital Stay (HOSPITAL_COMMUNITY): Payer: Medicare Other

## 2021-03-16 DIAGNOSIS — I5042 Chronic combined systolic (congestive) and diastolic (congestive) heart failure: Secondary | ICD-10-CM | POA: Diagnosis not present

## 2021-03-16 DIAGNOSIS — E1122 Type 2 diabetes mellitus with diabetic chronic kidney disease: Secondary | ICD-10-CM

## 2021-03-16 DIAGNOSIS — N179 Acute kidney failure, unspecified: Secondary | ICD-10-CM

## 2021-03-16 DIAGNOSIS — R197 Diarrhea, unspecified: Secondary | ICD-10-CM | POA: Diagnosis not present

## 2021-03-16 DIAGNOSIS — E878 Other disorders of electrolyte and fluid balance, not elsewhere classified: Secondary | ICD-10-CM | POA: Diagnosis not present

## 2021-03-16 DIAGNOSIS — N184 Chronic kidney disease, stage 4 (severe): Secondary | ICD-10-CM

## 2021-03-16 LAB — BASIC METABOLIC PANEL
Anion gap: 5 (ref 5–15)
BUN: 15 mg/dL (ref 6–20)
CO2: 24 mmol/L (ref 22–32)
Calcium: 7.5 mg/dL — ABNORMAL LOW (ref 8.9–10.3)
Chloride: 106 mmol/L (ref 98–111)
Creatinine, Ser: 1.55 mg/dL — ABNORMAL HIGH (ref 0.44–1.00)
GFR, Estimated: 39 mL/min — ABNORMAL LOW (ref >60)
Glucose, Bld: 101 mg/dL — ABNORMAL HIGH (ref 70–99)
Potassium: 4.6 mmol/L (ref 3.5–5.1)
Sodium: 135 mmol/L (ref 135–145)

## 2021-03-16 LAB — GLUCOSE, CAPILLARY
Glucose-Capillary: 93 mg/dL (ref 70–99)
Glucose-Capillary: 98 mg/dL (ref 70–99)

## 2021-03-16 MED ORDER — LOPERAMIDE HCL 2 MG PO CAPS: 4.0000 mg | ORAL_CAPSULE | Freq: Two times a day (BID) | ORAL | 0 refills | Status: DC

## 2021-03-16 MED ORDER — FUROSEMIDE 10 MG/ML IJ SOLN
20.0000 mg | Freq: Once | INTRAMUSCULAR | Status: AC
  Administered 2021-03-16: 20 mg via INTRAVENOUS
  Filled 2021-03-16: qty 2

## 2021-03-16 MED ORDER — TORSEMIDE 20 MG PO TABS: 20.0000 mg | ORAL_TABLET | Freq: Every day | ORAL | Status: DC

## 2021-03-16 MED ORDER — PREGABALIN 100 MG PO CAPS
100.0000 mg | ORAL_CAPSULE | Freq: Two times a day (BID) | ORAL | 0 refills | Status: AC
Start: 2021-03-16 — End: ?

## 2021-03-16 NOTE — Discharge Summary (Signed)
Physician Discharge Summary  [name] OZD:[id number] DOB: [birth year] DOA: 03/08/2021  PCP: Frazier Richards, MD  Admit date: 03/08/2021 Discharge date: 03/16/2021  Admitted From: home Disposition:  home  Recommendations for Outpatient Follow-up:  1. Follow up with PCP in 1-2 weeks 2. Please obtain BMP/CBC in one week 3. Follow up with GI in the next 2 weeks  Home Health: [facility] RN, PT, SW Equipment/Devices:hospital bed  Discharge Condition:stable CODE STATUS:full code Diet recommendation: lactose free, carb modified  Brief/Interim Summary: [name] Milesis a 55 y.o.femalewith medical history significant foratrial fibrillation, systolic and diastolic CHF, diabetes mellitus, hypertension,bilateral above-knee amputation,CKD 4, NSTEMI. Patient was sent to the ED by her primary care provider(clinic in [place])reports of low potassium. Patient has had diarrhea over the past 4 -5weeks.Patient was admitted for severe electrolyte abnormalities in the setting of her home diarrhea.She continues to have persistent diarrhea for which she underwent EGD and colonoscopy. Results were unrevealing and biopsies still pending. Overall diarrhea has improved with imodium and avoiding dairy products.   Discharge Diagnoses:  Principal Problem:   Electrolyte abnormality Active Problems:   Peripheral vascular disease (HCC)   Chronic combined systolic (congestive) and diastolic (congestive) heart failure (HCC)   CKD stage 4 due to type 2 diabetes mellitus (HCC)   S/P bilateral above knee amputation (HCC)   Hypokalemia   Diarrhea   Non-intractable vomiting   Left lower quadrant abdominal pain  Severe electrolyte abnormalities-improved -Noted to have hypokalemia, hypomagnesemia, and hypocalcemia on admission -all have been replaced -Monitor on telemetry  Intractable diarrhea-ongoing -Likely related to lactose intolerance and pt has been consuming dairy -C. difficile study  negative -GI panelnegative -Startedimodium 50m bid 4/23 and overall diarrhea improved -Abdominal exam benign -Appreciate GI evaluation -EGD/colonoscopy on 4/26 was unrevealing -Celiac panelpending -TSH 2.88 -tolerating soft diet  Atrial fibrillation-currently in sinus rhythm -Continue home amiodarone -Metoprolol to be held for SBP<100 -anticoagulated with xarelto  Chronic combined systolic and diastolic CHF-stable -she has developed some edema related to hypoalbuminemia and IV fluids -no shortness of breath -restart torsemide on discharge -Last 2D echocardiogram 11/2020 with LVEF 35-40%  Controlled type 2 diabetes -Patient has not taken insulin in the last several days due to poor oral intake -Hemoglobin A1c 5.7% -will discontinue basal insulin on discharge since she has not been receiving any in the hospital and blood sugars in normal range -will continue SSI on discharge  AKI on CKD stageIIIb -creatinine currently near baseline at 1.5 -renal ultrasound without hydronephrosis -continue to follow renal function  Peripheral vascular disease with bilateral above-knee amputations -Resume Lyrica 4/25, now increased to 1011mBID to help with neuropathy  Prolonged QTC -Likely related to electrolyte abnormalities -repeat EKGimproved -Resumedamitryptiline 4/24  Discharge Instructions  Discharge Instructions    Diet - low sodium heart healthy   Complete by: As directed    Increase activity slowly   Complete by: As directed      Allergies as of 03/16/2021      Reactions   Lactose Intolerance (gi) Diarrhea      Medication List    STOP taking these medications   insulin glargine 100 UNIT/ML injection Commonly known as: LANTUS     TAKE these medications   albuterol 2 MG tablet Commonly known as: PROVENTIL Take 1 tablet (2 mg total) by mouth daily as needed for shortness of breath.   amiodarone 200 MG tablet Commonly known as: Pacerone Take 1 tablet  (200 mg total) by mouth See admin instructions. oral amiodarone 40035m  bid x 7 days, then 24m bid x 2 weeks, then 2078mdaily after that   amitriptyline 25 MG tablet Commonly known as: ELAVIL Take 1 tablet (25 mg total) by mouth at bedtime.   cyanocobalamin 1000 MCG tablet Take 1 tablet (1,000 mcg total) by mouth daily.   dicyclomine 10 MG capsule Commonly known as: BENTYL Take 1 capsule (10 mg total) by mouth every 6 (six) hours as needed for spasms.   insulin lispro 100 UNIT/ML KwikPen Commonly known as: HumaLOG KwikPen Inject 4-10 Units into the skin 3 (three) times daily with meals. If blood sugar 121-150, 2 units If blood sugar 151-200, 4 units If blood sugar 201-250, 6 units If blood sugar 251-300, 8 units If blood sugar 301-500, 10 units If blood sugar greater than 350 call MD   loperamide 2 MG capsule Commonly known as: IMODIUM Take 2 capsules (4 mg total) by mouth 2 (two) times daily.   metoprolol tartrate 25 MG tablet Commonly known as: LOPRESSOR Take 0.5 tablets (12.5 mg total) by mouth 2 (two) times daily.   naloxone 4 MG/0.1ML Liqd nasal spray kit Commonly known as: Narcan Place 1 spray into the nose once as needed (opioid overdose).   nicotine 21 mg/24hr patch Commonly known as: NICODERM CQ - dosed in mg/24 hours Place 21 mg onto the skin daily. Rotate sites and remove old patch prior to application   omeprazole 40 MG capsule Commonly known as: PRILOSEC Take 1 capsule (40 mg total) by mouth daily.   Potassium Chloride ER 20 MEQ Tbcr Take 20 mEq by mouth daily. 1 tab daily by mouth   pregabalin 100 MG capsule Commonly known as: LYRICA Take 1 capsule (100 mg total) by mouth 2 (two) times daily. What changed:   medication strength  how much to take   Rivaroxaban 15 MG Tabs tablet Commonly known as: XARELTO Take 1 tablet (15 mg total) by mouth daily with supper.   rosuvastatin 20 MG tablet Commonly known as: CRESTOR Take 1 tablet (20 mg total) by  mouth daily at 6 PM.   sodium bicarbonate 650 MG tablet Take 1 tablet (650 mg total) by mouth 2 (two) times daily.   torsemide 20 MG tablet Commonly known as: DEMADEX Take 1 tablet (20 mg total) by mouth daily.   Venelex Oint Apply 1 application topically 3 (three) times daily. Every shift to bilateral buttocks, coccyx, and sacrum            Durable Medical Equipment  (From admission, onward)         Start     Ordered   03/15/21 1349  For home use only DME Hospital bed  Once       Question Answer Comment  Length of Need Lifetime   Patient has (list medical condition): CHF, bilateral lower extremity amputations   The above medical condition requires: Patient requires the ability to reposition frequently   Head must be elevated greater than: 30 degrees   Bed type Semi-electric   Support Surface: Gel Overlay      03/15/21 1351          Allergies  Allergen Reactions  . Lactose Intolerance (Gi) Diarrhea    Consultations:  GI   Procedures/Studies: [place]  Result Date: 03/16/2021 CLINICAL DATA:  Acute kidney injury, abnormal labs today EXAM: RENAL / URINARY TRACT ULTRASOUND COMPLETE COMPARISON:  Renal ultrasound 10/10/2020 FINDINGS: Right Kidney: Renal measurements: 13.2 x 5.7 x 5.4 cm = volume: 213.6 mL. Echogenicity within normal  limits. No mass or hydronephrosis visualized. Left Kidney: Renal measurements: 11.7 x 6.7 x 5.1 cm = volume: 209.1 mL. Limited visualization, however visualized portions demonstrate normal echogenicity without hydronephrosis. Bladder: Appears normal for degree of bladder distention. Bilateral ureteral jets are seen. Other: None. IMPRESSION: No hydronephrosis. Electronically Signed   By: Maurine Simmering   On: 03/16/2021 08:45       Subjective: No vomiting, tolerating diet, no diarrhea today  Discharge Exam: Vitals:   03/15/21 0532 03/15/21 2126 03/16/21 0455 03/16/21 1248  BP: 131/65 (!) 120/56 (!) 110/58 124/68  Pulse: 75 79 72 68   Resp: _0 Temp: 98.5 F (36.9 C) 98.8 F (37.1 C) 99.3 F (37.4 C) 98.4 F (36.9 C)  TempSrc:  Oral Oral Oral  SpO2: 94% 96% 93% 96%  Weight:        General: Pt is alert, awake, not in acute distress Cardiovascular: RRR, S1/S2 +, no rubs, no gallops Respiratory: CTA bilaterally, no wheezing, no rhonchi Abdominal: Soft, NT, ND, bowel sounds + Extremities: bilateral lower extremity amputations    The results of significant diagnostics from this hospitalization (including imaging, microbiology, ancillary and laboratory) are listed below for reference.     Microbiology: Recent Results (from the past 240 hour(s))  SARS CORONAVIRUS 2 (TAT 6-24 HRS) Nasopharyngeal Nasopharyngeal Swab     Status: None   Collection Time: 03/08/21  1:49 PM   Specimen: Nasopharyngeal Swab  Result Value Ref Range Status   SARS Coronavirus 2 NEGATIVE NEGATIVE Final    Comment: (NOTE) SARS-CoV-2 target nucleic acids are NOT DETECTED.  The SARS-CoV-2 RNA is generally detectable in upper and lower respiratory specimens during the acute phase of infection. Negative results do not preclude SARS-CoV-2 infection, do not rule out co-infections with other pathogens, and should not be used as the sole basis for treatment or other patient management decisions. Negative results must be combined with clinical observations, patient history, and epidemiological information. The expected result is Negative.  Fact Sheet for Patients: SugarRoll.be  Fact Sheet for Healthcare Providers: https://www.woods-mathews.com/  This test is not yet approved or cleared by the [place] FDA and  has been authorized for detection and/or diagnosis of SARS-CoV-2 by FDA under an Emergency Use Authorization (EUA). This EUA will remain  in effect (meaning this test can be used) for the duration of the COVID-19 declaration under Se ction 564(b)(1) of the Act, 21 U.S.C. section  360bbb-3(b)(1), unless the authorization is terminated or revoked sooner.  Performed at [facility], [place] [address] [place], [place] [address]   C Difficile Quick Screen w PCR reflex     Status: None   Collection Time: 03/08/21  9:07 PM   Specimen: STOOL  Result Value Ref Range Status   C Diff antigen NEGATIVE NEGATIVE Final   C Diff toxin NEGATIVE NEGATIVE Final   C Diff interpretation No C. difficile detected.  Final    Comment: Performed at [facility], [address], [place], [place] [address]  Gastrointestinal Panel by PCR , Stool     Status: None   Collection Time: 03/08/21  9:07 PM   Specimen: STOOL  Result Value Ref Range Status   Campylobacter species NOT DETECTED NOT DETECTED Final   Plesimonas shigelloides NOT DETECTED NOT DETECTED Final   Salmonella species NOT DETECTED NOT DETECTED Final   Yersinia enterocolitica NOT DETECTED NOT DETECTED Final   Vibrio species NOT DETECTED NOT DETECTED Final   Vibrio cholerae NOT DETECTED NOT  DETECTED Final   Enteroaggregative E coli (EAEC) NOT DETECTED NOT DETECTED Final   Enteropathogenic E coli (EPEC) NOT DETECTED NOT DETECTED Final   Enterotoxigenic E coli (ETEC) NOT DETECTED NOT DETECTED Final   Shiga like toxin producing E coli (STEC) NOT DETECTED NOT DETECTED Final   Shigella/Enteroinvasive E coli (EIEC) NOT DETECTED NOT DETECTED Final   Cryptosporidium NOT DETECTED NOT DETECTED Final   Cyclospora cayetanensis NOT DETECTED NOT DETECTED Final   Entamoeba histolytica NOT DETECTED NOT DETECTED Final   Giardia lamblia NOT DETECTED NOT DETECTED Final   Adenovirus F40/41 NOT DETECTED NOT DETECTED Final   Astrovirus NOT DETECTED NOT DETECTED Final   Norovirus GI/GII NOT DETECTED NOT DETECTED Final   Rotavirus A NOT DETECTED NOT DETECTED Final   Sapovirus (I, II, IV, and V) NOT DETECTED NOT DETECTED Final    Comment: Performed at [facility], [place] [place], [place] [address]     Labs: BNP (last 3  results) Recent Labs    11/29/20 1730 12/01/20 0605  BNP 2,629.0* 2,025.4*   Basic Metabolic Panel: Recent Labs  Lab 03/10/21 0415 03/11/21 0415 03/12/21 0508 03/13/21 0534 03/14/21 0457 03/15/21 0419 03/16/21 0418  NA 136 135 134* 133* 135 136 135  K 3.1* 3.4* 4.6 4.6 4.4 4.7 4.6  CL 101 101 102 101 104 106 106  CO2 _0 20* _1 GLUCOSE 147* 119* 75 67* 87 88 101*  BUN _2 CREATININE 1.37* 1.40* 1.28* 1.17* 1.35* 1.57* 1.55*  CALCIUM 6.3* 6.8* 7.2* 7.5* 7.6* 7.6* 7.5*  MG 1.4* 1.8 1.6* 1.9 1.9  --   --   PHOS  --   --   --   --   --  3.6  --    Liver Function Tests: Recent Labs  Lab 03/15/21 0419  ALBUMIN 1.9*  1.9*   No results for input(s): LIPASE, AMYLASE in the last 168 hours. No results for input(s): AMMONIA in the last 168 hours. CBC: Recent Labs  Lab 03/10/21 0415 03/11/21 0415 03/12/21 0508 03/13/21 0534  WBC 8.4 7.5 5.4 5.0  HGB 9.9* 9.8* 9.7* 9.8*  HCT 32.0* 32.5* 32.1* 33.3*  MCV 82.5 84.6 84.3 84.5  PLT 112* 113* 121* 147*   Cardiac Enzymes: No results for input(s): CKTOTAL, CKMB, CKMBINDEX, TROPONINI in the last 168 hours. BNP: Invalid input(s): POCBNP CBG: Recent Labs  Lab 03/15/21 1057 03/15/21 1606 03/15/21 2124 03/16/21 0823 03/16/21 1119  GLUCAP 108* 95 115* 93 98   D-Dimer No results for input(s): DDIMER in the last 72 hours. Hgb A1c No results for input(s): HGBA1C in the last 72 hours. Lipid Profile No results for input(s): CHOL, HDL, LDLCALC, TRIG, CHOLHDL, LDLDIRECT in the last 72 hours. Thyroid function studies No results for input(s): TSH, T4TOTAL, T3FREE, THYROIDAB in the last 72 hours.  Invalid input(s): FREET3 Anemia work up Recent Labs    03/15/21 0419  VITAMINB12 210   Urinalysis    Component Value Date/Time   COLORURINE YELLOW 11/30/2020 0108   APPEARANCEUR HAZY (A) 11/30/2020 0108   LABSPEC 1.010 11/30/2020 0108   PHURINE 5.0 11/30/2020 0108   GLUCOSEU NEGATIVE 11/30/2020  0108   HGBUR SMALL (A) 11/30/2020 0108   BILIRUBINUR NEGATIVE 11/30/2020 0108   KETONESUR NEGATIVE 11/30/2020 0108   PROTEINUR 30 (A) 11/30/2020 0108   UROBILINOGEN 0.2 06/25/2011 1707   NITRITE NEGATIVE 11/30/2020 0108   LEUKOCYTESUR TRACE (A) 11/30/2020 0108   Sepsis  Labs Invalid input(s): PROCALCITONIN,  WBC,  LACTICIDVEN Microbiology Recent Results (from the past 240 hour(s))  SARS CORONAVIRUS 2 (TAT 6-24 HRS) Nasopharyngeal Nasopharyngeal Swab     Status: None   Collection Time: 03/08/21  1:49 PM   Specimen: Nasopharyngeal Swab  Result Value Ref Range Status   SARS Coronavirus 2 NEGATIVE NEGATIVE Final    Comment: (NOTE) SARS-CoV-2 target nucleic acids are NOT DETECTED.  The SARS-CoV-2 RNA is generally detectable in upper and lower respiratory specimens during the acute phase of infection. Negative results do not preclude SARS-CoV-2 infection, do not rule out co-infections with other pathogens, and should not be used as the sole basis for treatment or other patient management decisions. Negative results must be combined with clinical observations, patient history, and epidemiological information. The expected result is Negative.  Fact Sheet for Patients: SugarRoll.be  Fact Sheet for Healthcare Providers: https://www.woods-mathews.com/  This test is not yet approved or cleared by the [place] FDA and  has been authorized for detection and/or diagnosis of SARS-CoV-2 by FDA under an Emergency Use Authorization (EUA). This EUA will remain  in effect (meaning this test can be used) for the duration of the COVID-19 declaration under Se ction 564(b)(1) of the Act, 21 U.S.C. section 360bbb-3(b)(1), unless the authorization is terminated or revoked sooner.  Performed at [facility], [place] [address], [place], [place] [address]   C Difficile Quick Screen w PCR reflex     Status: None   Collection Time: 03/08/21  9:07 PM    Specimen: STOOL  Result Value Ref Range Status   C Diff antigen NEGATIVE NEGATIVE Final   C Diff toxin NEGATIVE NEGATIVE Final   C Diff interpretation No C. difficile detected.  Final    Comment: Performed at [facility], [address] [place], [place] [address]  Gastrointestinal Panel by PCR , Stool     Status: None   Collection Time: 03/08/21  9:07 PM   Specimen: STOOL  Result Value Ref Range Status   Campylobacter species NOT DETECTED NOT DETECTED Final   Plesimonas shigelloides NOT DETECTED NOT DETECTED Final   Salmonella species NOT DETECTED NOT DETECTED Final   Yersinia enterocolitica NOT DETECTED NOT DETECTED Final   Vibrio species NOT DETECTED NOT DETECTED Final   Vibrio cholerae NOT DETECTED NOT DETECTED Final   Enteroaggregative E coli (EAEC) NOT DETECTED NOT DETECTED Final   Enteropathogenic E coli (EPEC) NOT DETECTED NOT DETECTED Final   Enterotoxigenic E coli (ETEC) NOT DETECTED NOT DETECTED Final   Shiga like toxin producing E coli (STEC) NOT DETECTED NOT DETECTED Final   Shigella/Enteroinvasive E coli (EIEC) NOT DETECTED NOT DETECTED Final   Cryptosporidium NOT DETECTED NOT DETECTED Final   Cyclospora cayetanensis NOT DETECTED NOT DETECTED Final   Entamoeba histolytica NOT DETECTED NOT DETECTED Final   Giardia lamblia NOT DETECTED NOT DETECTED Final   Adenovirus F40/41 NOT DETECTED NOT DETECTED Final   Astrovirus NOT DETECTED NOT DETECTED Final   Norovirus GI/GII NOT DETECTED NOT DETECTED Final   Rotavirus A NOT DETECTED NOT DETECTED Final   Sapovirus (I, II, IV, and V) NOT DETECTED NOT DETECTED Final    Comment: Performed at [facility], [address], [place],  [address]     Time coordinating discharge: 46mns  SIGNED:   JKathie Dike MD  [name] 03/16/2021, 1:45 PM   If 7PM-7AM, please contact night-coverage [email or web address]

## 2021-03-16 NOTE — Progress Notes (Addendum)
Subjective: No BMs today. She hasn't had any dairy products today. No abdominal pain. No nausea or vomiting. Tolerating her diet very well.   Objective: Vital signs in last 24 hours: Temp:  [98.8 F (37.1 C)-99.3 F (37.4 C)] 99.3 F (37.4 C) (04/29 0455) Pulse Rate:  [72-79] 72 (04/29 0455) Resp:  [18-20] 20 (04/29 0455) BP: (110-120)/(56-58) 110/58 (04/29 0455) SpO2:  [93 %-96 %] 93 % (04/29 0455) Last BM Date: 03/15/21 General:   Alert and oriented, pleasant Head:  Normocephalic and atraumatic. Eyes:  No icterus, sclera clear. Conjuctiva pink.  Abdomen:  Bowel sounds present, soft, non-tender, non-distended. No HSM or hernias noted. No rebound or guarding. No masses appreciated  Extremities: Left above-the-knee amputation and right below the knee amputation. Neurologic:  Alert and  oriented x4;  grossly normal neurologically. Skin:  Warm and dry, intact without significant lesions.  Psych:  Normal mood and affect.  Intake/Output from previous day: 04/28 0701 - 04/29 0700 In: 720 [P.O.:720] Out: 4 [Emesis/NG output:1; Stool:3] Intake/Output this shift: Total I/O In: 240 [P.O.:240] Out: 500 [Urine:500]  Lab Results: No results for input(s): WBC, HGB, HCT, PLT in the last 72 hours. BMET Recent Labs    03/14/21 0457 03/15/21 0419 03/16/21 0418  NA 135 136 135  K 4.4 4.7 4.6  CL 104 106 106  CO2 22 25 24   GLUCOSE 87 88 101*  BUN 13 12 15   CREATININE 1.35* 1.57* 1.55*  CALCIUM 7.6* 7.6* 7.5*   LFT Recent Labs    03/15/21 0419  ALBUMIN 1.9*  1.9*    Studies/Results: US RENAL  Result Date: 03/16/2021 CLINICAL DATA:  Acute kidney injury, abnormal labs today EXAM: RENAL / URINARY TRACT ULTRASOUND COMPLETE COMPARISON:  Renal ultrasound 10/10/2020 FINDINGS: Right Kidney: Renal measurements: 13.2 x 5.7 x 5.4 cm = volume: 213.6 mL. Echogenicity within normal limits. No mass or hydronephrosis visualized. Left Kidney: Renal measurements: 11.7 x 6.7 x 5.1 cm =  volume: 209.1 mL. Limited visualization, however visualized portions demonstrate normal echogenicity without hydronephrosis. Bladder: Appears normal for degree of bladder distention. Bilateral ureteral jets are seen. Other: None. IMPRESSION: No hydronephrosis. Electronically Signed   By: Maurine Simmering   On: 03/16/2021 08:45    Assessment: 55 year old female admitted for severe electrolyte abnormalities in setting of chronic diarrhea, nausea and vomiting, with diarrhea worsening in the past few weeks. Cdiff and GI pathogen panel negative this admission. TSH normal. Celiac serologies remain in process. History of afib on Xarelto, held for procedures on 4/26, and resumed thereafter.  Diarrhea: acute on chronic symptoms. Colonoscopy completed this admission. Total of 9 polyps removed, normal TI, random colon biopsies. Path benign random colon biopsies and tubular adenomas.  She will need repeat colonoscopy in 6 months.  Celiac serologies are still pending, but EGD with duodenal biopsy benign.  Stool studies were negative.  She did report lactose intolerance to milk and ice cream, but she had continued consuming dairy products.  Notably, today, diarrhea seems improved with no BM as of 12:30 p.m.  States she has not had any dairy products this morning.  Etiology likely multifactorial.  Possible malabsorptive process given low normal B12 and low albumin, lactose intolerance, IBS overlay, bile salt diarrhea.  It is encouraging that her diarrhea has significantly improved with scheduled Imodium and avoiding dairy products.  N/V: chronic.  Positive early satiety.  A1c this admission 5.6.  EGD essentially unremarkable. No problems at this time. May have underlying gastroparesis.  Could consider  gastric emptying study in the future if needed.  Abdominal pain: previously noted LLQ with diarrhea. Pain has resolved.  Abdominal exam is benign.  Anemia: chronic and at baseline. Like anemia of chronic disease based on  labs. She has LOW NORMAL B12 and would likely benefit from oral supplement.    Plan: 1. Repeat colonoscopy in 6 months given piecemeal resection of polyp . 2. Continue Imodium for now. Hold in the setting of constipation.  3. Follow-up celiac serologies. 4. Recommend avoiding dairy given lactose intolerance.  Discussed trying lactose-free products/substitutes.  5. Consider oral B12- per hospitalist.      LOS: 7 days    03/16/2021, 12:34 PM   [facility], [facility]

## 2021-03-16 NOTE — Care Management Important Message (Signed)
Important Message  Patient Details  Name: [name] MRN: [id number] Date of Birth: [birth year]   Medicare Important Message Given:  Yes     [name] 03/16/2021, 12:10 PM

## 2021-03-16 NOTE — Progress Notes (Addendum)
Patient now has +2 pitting edema in lower extremities and lower back. MD [name] notified. MD ordered 20 mg of lasix.

## 2021-03-18 ENCOUNTER — Telehealth: Payer: Self-pay | Admitting: Gastroenterology

## 2021-03-18 NOTE — Telephone Encounter (Signed)
Patient was discharged from the hospital on 4/29. Please arrange hospital follow-up for diarrhea in 4-6 weeks.

## 2021-03-19 ENCOUNTER — Encounter: Payer: Self-pay | Admitting: Internal Medicine

## 2021-03-19 LAB — GLIA (IGA/G) + TTG IGA
Antigliadin Abs, IgA: 4 units (ref 0–19)
Gliadin IgG: 1 units (ref 0–19)
Tissue Transglutaminase Ab, IgA: <2 U/mL (ref 0–3)

## 2021-03-28 ENCOUNTER — Other Ambulatory Visit: Payer: Self-pay

## 2021-03-28 ENCOUNTER — Encounter (HOSPITAL_COMMUNITY): Payer: Self-pay

## 2021-03-28 ENCOUNTER — Inpatient Hospital Stay (HOSPITAL_COMMUNITY)
Admission: EM | Admit: 2021-03-28 | Discharge: 2021-04-02 | DRG: 391 | Disposition: A | Discharge status: Home / Self Care | Payer: Medicare Other | Attending: Internal Medicine | Admitting: Internal Medicine

## 2021-03-28 DIAGNOSIS — N183 Chronic kidney disease, stage 3 unspecified: Secondary | ICD-10-CM | POA: Diagnosis present

## 2021-03-28 DIAGNOSIS — K529 Noninfective gastroenteritis and colitis, unspecified: Secondary | ICD-10-CM | POA: Diagnosis not present

## 2021-03-28 DIAGNOSIS — E8809 Other disorders of plasma-protein metabolism, not elsewhere classified: Secondary | ICD-10-CM | POA: Diagnosis present

## 2021-03-28 DIAGNOSIS — E1151 Type 2 diabetes mellitus with diabetic peripheral angiopathy without gangrene: Secondary | ICD-10-CM | POA: Diagnosis present

## 2021-03-28 DIAGNOSIS — G8929 Other chronic pain: Secondary | ICD-10-CM | POA: Diagnosis present

## 2021-03-28 DIAGNOSIS — Z7901 Long term (current) use of anticoagulants: Secondary | ICD-10-CM

## 2021-03-28 DIAGNOSIS — Z83438 Family history of other disorder of lipoprotein metabolism and other lipidemia: Secondary | ICD-10-CM

## 2021-03-28 DIAGNOSIS — E1122 Type 2 diabetes mellitus with diabetic chronic kidney disease: Secondary | ICD-10-CM | POA: Diagnosis not present

## 2021-03-28 DIAGNOSIS — E785 Hyperlipidemia, unspecified: Secondary | ICD-10-CM | POA: Diagnosis present

## 2021-03-28 DIAGNOSIS — I5042 Chronic combined systolic (congestive) and diastolic (congestive) heart failure: Secondary | ICD-10-CM | POA: Diagnosis present

## 2021-03-28 DIAGNOSIS — N1831 Chronic kidney disease, stage 3a: Secondary | ICD-10-CM | POA: Diagnosis present

## 2021-03-28 DIAGNOSIS — L89313 Pressure ulcer of right buttock, stage 3: Secondary | ICD-10-CM | POA: Diagnosis present

## 2021-03-28 DIAGNOSIS — I4891 Unspecified atrial fibrillation: Secondary | ICD-10-CM | POA: Diagnosis not present

## 2021-03-28 DIAGNOSIS — Z833 Family history of diabetes mellitus: Secondary | ICD-10-CM

## 2021-03-28 DIAGNOSIS — U071 COVID-19: Secondary | ICD-10-CM | POA: Diagnosis present

## 2021-03-28 DIAGNOSIS — E1169 Type 2 diabetes mellitus with other specified complication: Secondary | ICD-10-CM | POA: Diagnosis present

## 2021-03-28 DIAGNOSIS — Z89611 Acquired absence of right leg above knee: Secondary | ICD-10-CM

## 2021-03-28 DIAGNOSIS — K909 Intestinal malabsorption, unspecified: Secondary | ICD-10-CM | POA: Diagnosis present

## 2021-03-28 DIAGNOSIS — I252 Old myocardial infarction: Secondary | ICD-10-CM

## 2021-03-28 DIAGNOSIS — R338 Other retention of urine: Secondary | ICD-10-CM | POA: Diagnosis present

## 2021-03-28 DIAGNOSIS — Z794 Long term (current) use of insulin: Secondary | ICD-10-CM

## 2021-03-28 DIAGNOSIS — I2510 Atherosclerotic heart disease of native coronary artery without angina pectoris: Secondary | ICD-10-CM | POA: Diagnosis present

## 2021-03-28 DIAGNOSIS — I130 Hypertensive heart and chronic kidney disease with heart failure and stage 1 through stage 4 chronic kidney disease, or unspecified chronic kidney disease: Secondary | ICD-10-CM | POA: Diagnosis present

## 2021-03-28 DIAGNOSIS — E1142 Type 2 diabetes mellitus with diabetic polyneuropathy: Secondary | ICD-10-CM | POA: Diagnosis present

## 2021-03-28 DIAGNOSIS — E871 Hypo-osmolality and hyponatremia: Secondary | ICD-10-CM | POA: Diagnosis present

## 2021-03-28 DIAGNOSIS — K089 Disorder of teeth and supporting structures, unspecified: Secondary | ICD-10-CM | POA: Diagnosis present

## 2021-03-28 DIAGNOSIS — N184 Chronic kidney disease, stage 4 (severe): Secondary | ICD-10-CM | POA: Diagnosis present

## 2021-03-28 DIAGNOSIS — D638 Anemia in other chronic diseases classified elsewhere: Secondary | ICD-10-CM | POA: Diagnosis present

## 2021-03-28 DIAGNOSIS — I480 Paroxysmal atrial fibrillation: Secondary | ICD-10-CM | POA: Diagnosis present

## 2021-03-28 DIAGNOSIS — R54 Age-related physical debility: Secondary | ICD-10-CM | POA: Diagnosis present

## 2021-03-28 DIAGNOSIS — Z7984 Long term (current) use of oral hypoglycemic drugs: Secondary | ICD-10-CM

## 2021-03-28 DIAGNOSIS — Z89612 Acquired absence of left leg above knee: Secondary | ICD-10-CM

## 2021-03-28 DIAGNOSIS — R188 Other ascites: Secondary | ICD-10-CM | POA: Diagnosis present

## 2021-03-28 DIAGNOSIS — F17210 Nicotine dependence, cigarettes, uncomplicated: Secondary | ICD-10-CM | POA: Diagnosis present

## 2021-03-28 DIAGNOSIS — R197 Diarrhea, unspecified: Secondary | ICD-10-CM | POA: Diagnosis not present

## 2021-03-28 DIAGNOSIS — E876 Hypokalemia: Secondary | ICD-10-CM | POA: Diagnosis present

## 2021-03-28 DIAGNOSIS — K219 Gastro-esophageal reflux disease without esophagitis: Secondary | ICD-10-CM | POA: Diagnosis present

## 2021-03-28 DIAGNOSIS — E739 Lactose intolerance, unspecified: Secondary | ICD-10-CM | POA: Diagnosis present

## 2021-03-28 DIAGNOSIS — Z8249 Family history of ischemic heart disease and other diseases of the circulatory system: Secondary | ICD-10-CM

## 2021-03-28 LAB — CBC WITH DIFFERENTIAL/PLATELET
Abs Immature Granulocytes: 0.02 10*3/uL (ref 0.00–0.07)
Basophils Absolute: 0 10*3/uL (ref 0.0–0.1)
Basophils Relative: 0 %
Eosinophils Absolute: 0.1 10*3/uL (ref 0.0–0.5)
Eosinophils Relative: 1 %
HCT: 30.3 % — ABNORMAL LOW (ref 36.0–46.0)
Hemoglobin: 9.4 g/dL — ABNORMAL LOW (ref 12.0–15.0)
Immature Granulocytes: 0 %
Lymphocytes Relative: 10 %
Lymphs Abs: 0.5 10*3/uL — ABNORMAL LOW (ref 0.7–4.0)
MCH: 26.3 pg (ref 26.0–34.0)
MCHC: 31 g/dL (ref 30.0–36.0)
MCV: 84.9 fL (ref 80.0–100.0)
Monocytes Absolute: 0.3 10*3/uL (ref 0.1–1.0)
Monocytes Relative: 5 %
Neutro Abs: 4.8 10*3/uL (ref 1.7–7.7)
Neutrophils Relative %: 84 %
Platelets: 172 10*3/uL (ref 150–400)
RBC: 3.57 MIL/uL — ABNORMAL LOW (ref 3.87–5.11)
RDW: 20.5 % — ABNORMAL HIGH (ref 11.5–15.5)
WBC: 5.7 10*3/uL (ref 4.0–10.5)
nRBC: 0 % (ref 0.0–0.2)

## 2021-03-28 LAB — C-REACTIVE PROTEIN: CRP: 1.3 mg/dL — ABNORMAL HIGH (ref <1.0)

## 2021-03-28 LAB — RESP PANEL BY RT-PCR (FLU A&B, COVID) ARPGX2
Influenza A by PCR: NEGATIVE
Influenza B by PCR: NEGATIVE
SARS Coronavirus 2 by RT PCR: POSITIVE — AB

## 2021-03-28 LAB — COMPREHENSIVE METABOLIC PANEL
ALT: 11 U/L (ref 0–44)
AST: 11 U/L — ABNORMAL LOW (ref 15–41)
Albumin: 2.1 g/dL — ABNORMAL LOW (ref 3.5–5.0)
Alkaline Phosphatase: 92 U/L (ref 38–126)
Anion gap: 6 (ref 5–15)
BUN: 7 mg/dL (ref 6–20)
CO2: 24 mmol/L (ref 22–32)
Calcium: 6.6 mg/dL — ABNORMAL LOW (ref 8.9–10.3)
Chloride: 107 mmol/L (ref 98–111)
Creatinine, Ser: 1.23 mg/dL — ABNORMAL HIGH (ref 0.44–1.00)
GFR, Estimated: 52 mL/min — ABNORMAL LOW (ref >60)
Glucose, Bld: 85 mg/dL (ref 70–99)
Potassium: 2.7 mmol/L — CL (ref 3.5–5.1)
Sodium: 137 mmol/L (ref 135–145)
Total Bilirubin: 0.5 mg/dL (ref 0.3–1.2)
Total Protein: 5.3 g/dL — ABNORMAL LOW (ref 6.5–8.1)

## 2021-03-28 LAB — CBG MONITORING, ED
Glucose-Capillary: 74 mg/dL (ref 70–99)
Glucose-Capillary: 100 mg/dL — ABNORMAL HIGH (ref 70–99)

## 2021-03-28 LAB — LIPASE, BLOOD: Lipase: 19 U/L (ref 11–51)

## 2021-03-28 LAB — MAGNESIUM: Magnesium: 1 mg/dL — ABNORMAL LOW (ref 1.7–2.4)

## 2021-03-28 MED ORDER — SODIUM CHLORIDE 0.9 % IV SOLN
100.0000 mg | Freq: Every day | INTRAVENOUS | Status: AC
  Administered 2021-03-29 – 2021-03-30 (×2): 100 mg via INTRAVENOUS
  Filled 2021-03-28 (×4): qty 20

## 2021-03-28 MED ORDER — POTASSIUM CHLORIDE 10 MEQ/100ML IV SOLN
10.0000 meq | INTRAVENOUS | Status: AC
  Administered 2021-03-28 (×5): 10 meq via INTRAVENOUS
  Filled 2021-03-28 (×5): qty 100

## 2021-03-28 MED ORDER — CALCIUM GLUCONATE-NACL 1-0.675 GM/50ML-% IV SOLN
1.0000 g | INTRAVENOUS | Status: AC
  Administered 2021-03-28 (×2): 1000 mg via INTRAVENOUS
  Filled 2021-03-28: qty 50

## 2021-03-28 MED ORDER — SODIUM CHLORIDE 0.9 % IV SOLN: 100.0000 mg | Freq: Every day | INTRAVENOUS | Status: DC

## 2021-03-28 MED ORDER — ROSUVASTATIN CALCIUM 20 MG PO TABS
20.0000 mg | ORAL_TABLET | Freq: Every day | ORAL | Status: DC
  Administered 2021-03-29 – 2021-04-01 (×4): 20 mg via ORAL
  Filled 2021-03-28 (×4): qty 1

## 2021-03-28 MED ORDER — MAGNESIUM SULFATE 4 GM/100ML IV SOLN
4.0000 g | Freq: Once | INTRAVENOUS | Status: AC
  Administered 2021-03-28: 4 g via INTRAVENOUS
  Filled 2021-03-28: qty 100

## 2021-03-28 MED ORDER — PANTOPRAZOLE SODIUM 40 MG PO TBEC
40.0000 mg | DELAYED_RELEASE_TABLET | Freq: Every day | ORAL | Status: DC
  Administered 2021-03-29 – 2021-04-02 (×5): 40 mg via ORAL
  Filled 2021-03-28 (×5): qty 1

## 2021-03-28 MED ORDER — VITAMIN B-12 1000 MCG PO TABS
1000.0000 ug | ORAL_TABLET | Freq: Every day | ORAL | Status: DC
  Administered 2021-03-29 – 2021-04-02 (×5): 1000 ug via ORAL
  Filled 2021-03-28 (×5): qty 1

## 2021-03-28 MED ORDER — SODIUM CHLORIDE 0.9 % IV SOLN: 200.0000 mg | Freq: Once | INTRAVENOUS | Status: DC

## 2021-03-28 MED ORDER — RIVAROXABAN 15 MG PO TABS
15.0000 mg | ORAL_TABLET | Freq: Every day | ORAL | Status: DC
  Administered 2021-03-29 – 2021-04-01 (×4): 15 mg via ORAL
  Filled 2021-03-28 (×4): qty 1

## 2021-03-28 MED ORDER — DICYCLOMINE HCL 10 MG PO CAPS: 10.0000 mg | ORAL_CAPSULE | Freq: Four times a day (QID) | ORAL | Status: DC | PRN

## 2021-03-28 MED ORDER — CALCIUM GLUCONATE-NACL 2-0.675 GM/100ML-% IV SOLN
2.0000 g | Freq: Once | INTRAVENOUS | Status: DC
  Filled 2021-03-28: qty 100

## 2021-03-28 MED ORDER — POTASSIUM CHLORIDE CRYS ER 20 MEQ PO TBCR
40.0000 meq | EXTENDED_RELEASE_TABLET | Freq: Once | ORAL | Status: AC
  Administered 2021-03-28: 40 meq via ORAL
  Filled 2021-03-28: qty 2

## 2021-03-28 MED ORDER — METOPROLOL TARTRATE 25 MG PO TABS
12.5000 mg | ORAL_TABLET | Freq: Two times a day (BID) | ORAL | Status: DC
  Administered 2021-03-29 – 2021-04-02 (×9): 12.5 mg via ORAL
  Filled 2021-03-28 (×9): qty 1

## 2021-03-28 MED ORDER — SODIUM BICARBONATE 650 MG PO TABS
650.0000 mg | ORAL_TABLET | Freq: Two times a day (BID) | ORAL | Status: DC
  Administered 2021-03-29 – 2021-04-02 (×9): 650 mg via ORAL
  Filled 2021-03-28 (×10): qty 1

## 2021-03-28 MED ORDER — POTASSIUM CHLORIDE 10 MEQ/100ML IV SOLN: 10.0000 meq | Freq: Once | INTRAVENOUS | Status: DC

## 2021-03-28 MED ORDER — PREGABALIN 50 MG PO CAPS
100.0000 mg | ORAL_CAPSULE | Freq: Two times a day (BID) | ORAL | Status: DC
  Administered 2021-03-29 – 2021-04-02 (×9): 100 mg via ORAL
  Filled 2021-03-28 (×9): qty 2

## 2021-03-28 MED ORDER — SODIUM CHLORIDE 0.9 % IV SOLN
100.0000 mg | Freq: Once | INTRAVENOUS | Status: AC
  Administered 2021-03-28: 100 mg via INTRAVENOUS

## 2021-03-28 MED ORDER — INSULIN ASPART 100 UNIT/ML IJ SOLN
0.0000 [IU] | Freq: Every day | INTRAMUSCULAR | Status: DC
  Filled 2021-03-28: qty 1

## 2021-03-28 MED ORDER — NICOTINE 21 MG/24HR TD PT24
21.0000 mg | MEDICATED_PATCH | Freq: Every day | TRANSDERMAL | Status: DC
  Administered 2021-03-29 – 2021-04-02 (×5): 21 mg via TRANSDERMAL
  Filled 2021-03-28 (×5): qty 1

## 2021-03-28 MED ORDER — INSULIN ASPART 100 UNIT/ML IJ SOLN
0.0000 [IU] | Freq: Three times a day (TID) | INTRAMUSCULAR | Status: DC
  Administered 2021-03-29 – 2021-03-31 (×3): 2 [IU] via SUBCUTANEOUS
  Administered 2021-03-31: 3 [IU] via SUBCUTANEOUS
  Administered 2021-04-01 – 2021-04-02 (×5): 2 [IU] via SUBCUTANEOUS

## 2021-03-28 NOTE — ED Triage Notes (Signed)
Pt to er, pt states that she is here for continued diarrhea and occasional vomiting. States that she has been having the diarrhea since she was here back in April.  Pt states that her home health nurse called her md and it was recommended that she come back to the er.

## 2021-03-28 NOTE — ED Provider Notes (Signed)
[facility] Provider Note   CSN: [id number] Arrival date & time: 03/28/21  1147     History Chief Complaint  Patient presents with  . Diarrhea    [name] is a 55 y.o. female with a history of non-insulin-dependent diabetes, hyperlipidemia, stage IV chronic kidney disease, hypertension, ischemia cardiomyopathy and CAD, GERD and history of non-STEMI, also history of paroxysmal atrial fibrillation presenting for evaluation of persistent diarrhea.  She also describes intermittent aching pain in her left lower quadrant which is worsened prior to diarrheal episodes.  This is a chronic complaint.  Surgical history significant for cholecystectomy years ago, she states this was not accompanied by postsurgical diarrhea.  She was admitted to this hospital for this same complaint last month, having been discharged on April 29.  During this admission she underwent upper endoscopy and colonoscopy, also had stool studie which were negative.  Her colonoscopy was revealing for multiple polyps, she also had celiac disease screening, but patient is on aware of the results of this test.  She does have a history of lactose intolerance and has been avoiding dairy products.  She is also using Imodium daily but continues to have too numerous to count episodes of diarrhea.  She reports using almost a full 25 count pack of Depends since yesterday due to this complaint.  She denies fevers or chills.  She does have persistent nausea with occasional vomiting, stating 1-2 episodes of vomiting every other day on average, no recognized triggers.  She has found no alleviators for symptoms.  She does have a home health care in place twice weekly and her home health nurse visited this morning and recommended she come here for reevaluation of her symptoms.  She is not in contact with her gastroenterologist (Dr. Eula Listen).  The history is provided by the patient.       Past Medical History:  Diagnosis  Date  . Abnormal stress test    a. 02/2017 MV: large region of fixed perfusion defect in basal to mid inf, mid-dist inflat walls, EF 43%. No ischemia (EF 55-60% by f/u echo).  . Arthritis   . Asthma   . Carotid arterial disease ([place])    a. 09/2017 Carotid U/S: 40-49% bilat ICA stenosis.  . Chronic back pain   . Coronary artery calcification seen on CT scan    a. 11/2017 CT Abd/Pelvis: Multi vessel coronary vascular Ca2+.  . Depression   . Diabetes mellitus   . Diabetic neuropathy ([place])   . Difficult intubation    DIFFICULT AIRWAY/FYI  . Family history of adverse reaction to anesthesia    mother had difficlty waking   . Femoral-popliteal bypass graft occlusion, left ([place]) 12/02/2017  . GERD (gastroesophageal reflux disease)   . History of echocardiogram    a. 03/2017 Echo: EF 55-60%, mild LVH, nl RV fxn.  . Hyperlipidemia   . Ischemic cardiomyopathy    a. 04/2018 Echo: EF 30-35%, Gr2 DD, mild LVH. Mild MR. Mildly dil LA, mod dil RV w/ mod red RV fxn, mild TR, PASP 10mmHg.  . NSTEMI (non-ST elevated myocardial infarction) ([place])    a. 05/2018 in setting of Afib, sepsis, and post-op L AKA. Peak trop 7.7. EF 30-35% by echo-->cath not performed 2/2 renal failure.  . 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 PAD (peripheral artery disease) ([name])    a. S/p L fem-pop bypass; b. 11/2017 s/p Aortobifem bypass 2/2 graft occlusion; c. 03/2018 L Fem-PTA bypass w/ subsequent thrombectomy; d.  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  . Paroxysmal atrial fibrillation with rapid ventricular response ([place]) 12/02/2017   a. CHA2DS2VASc = 3-->Xarelto; b. 05/2018 Recurrent Afib-->amio.  Marland Kitchen Renal insufficiency   . Ulcer    Foot    Patient Active Problem List   Diagnosis Date Noted  . Diarrhea   . Non-intractable vomiting   . Left lower quadrant abdominal pain   . Hypokalemia 03/09/2021  . Electrolyte abnormality 03/08/2021  . S/P bilateral above knee amputation ([name]) 03/08/2021  . 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 CKD stage 4 due to type 2 diabetes mellitus ([place]) 12/05/2020  . Hyperlipidemia associated with type 2 diabetes mellitus ([place]) 12/05/2020  . Controlled type 2 diabetes mellitus with stage 4 chronic kidney disease, with long-term current use of insulin ([place]) 12/05/2020  . S/P BKA (below knee amputation) unilateral, right ([place]) 12/05/2020  . Anemia due to stage 4 chronic kidney disease ([place]) 12/05/2020  . Leukopenia 11/29/2020  . Elevated brain natriuretic peptide (BNP) level 11/29/2020  . Elevated d-dimer 11/29/2020  . Prolonged QT interval 11/29/2020  . Stable treated proliferative diabetic retinopathy of left eye determined by examination associated with type 2 diabetes mellitus ([place]) 08/14/2020  . Macular pucker, left eye 02/24/2020  . Follow-up examination after eye surgery 02/24/2020  . Coronary artery disease involving native coronary artery of native heart without angina pectoris 11/21/2018  . Ischemic cardiomyopathy 11/21/2018  . Essential hypertension 11/21/2018  . Labile blood pressure   . Diabetic retinopathy associated with type 2 diabetes mellitus ([place])   . Hypoalbuminemia due to protein-calorie malnutrition ([place])   . Type 2 diabetes mellitus with peripheral neuropathy (HCC)   . Chronic combined systolic (congestive) and diastolic (congestive) heart failure ([place])   . Unilateral AKA, left ([place])   . Atrial fibrillation with rapid ventricular response ([place])   . Diabetic peripheral neuropathy ([place])   . Anemia of chronic disease   . Gastroesophageal reflux disease   . Non-ST elevation (NSTEMI) myocardial infarction ([place])   . Acute combined systolic and diastolic heart failure ([place])   . Acute respiratory failure with hypoxia ([place])   . Microcytic anemia 04/25/2018  . Pulmonary nodule 04/25/2018  . Gangrene of lower extremity ([place]) 04/25/2018  . Femoral-popliteal bypass graft occlusion, left ([place]) 12/02/2017  . Respiratory failure  with hypoxia ([place]) 12/02/2017  . Leukocytosis 12/02/2017  . Paroxysmal atrial fibrillation with rapid ventricular response ([place]) 12/02/2017  . Hyperkalemia 06/26/2017  . Elevated lactic acid level   . Chronic back pain 11/27/2016  . Hyperlipidemia LDL goal <70   . Retinal tumor of right eye   . 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 Paresthesia of both hands 03/05/2016  . DDD (degenerative disc disease), lumbosacral 03/26/2015  . DDD (degenerative disc disease), cervical 03/26/2015  . Sacroiliac joint disease 03/26/2015  . Occipital neuralgia 03/26/2015  . Pain in limb-Bilateral Leg 04/25/2014  . Peripheral vascular disease ([place]) 04/05/2013  . Chronic total occlusion of artery of the extremities ([place]) 02/10/2012    Past Surgical History:  Procedure Laterality Date  . ABDOMINAL AORTAGRAM  June 15, 2014  . ABDOMINAL AORTAGRAM N/A 06/15/2014   Procedure: ABDOMINAL Maxcine Ham;  Surgeon: Serafina Mitchell, MD;  Location: [facility] CATH LAB;  Service: Cardiovascular;  Laterality: N/A;  . ABDOMINAL AORTAGRAM N/A 11/22/2014   Procedure: ABDOMINAL AORTAGRAM;  Surgeon: Serafina Mitchell, MD;  Location: [facility] CATH LAB;  Service: Cardiovascular;  Laterality: N/A;  . ABDOMINAL AORTOGRAM  W/LOWER EXTREMITY N/A 01/07/2017   Procedure: Abdominal Aortogram w/Lower Extremity;  Surgeon: Serafina Mitchell, MD;  Location: [facility] CV LAB;  Service: Cardiovascular;  Laterality: N/A;  . ABDOMINAL AORTOGRAM W/LOWER EXTREMITY N/A 10/31/2017   Procedure: ABDOMINAL AORTOGRAM W/LOWER EXTREMITY;  Surgeon: Elam Dutch, MD;  Location: [facility] CV LAB;  Service: Cardiovascular;  Laterality: N/A;  . ABDOMINAL AORTOGRAM W/LOWER EXTREMITY N/A 03/24/2018   Procedure: ABDOMINAL AORTOGRAM W/LOWER EXTREMITY;  Surgeon: Serafina Mitchell, MD;  Location: [place] CV LAB;  Service: Cardiovascular;  Laterality: N/A;  . AMPUTATION Left 04/26/2018   Procedure: AMPUTATION ABOVE KNEE;  Surgeon: Elam Dutch, MD;  Location:  [place];  Service: Vascular;  Laterality: Left;  . AORTA - BILATERAL FEMORAL ARTERY BYPASS GRAFT N/A 11/28/2017   Procedure: AORTA BIFEMORAL BYPASS USING HEMASHIELD GOLD GRAFT & REIMPLANT IMA;  Surgeon: Serafina Mitchell, MD;  Location: [facility] OR;  Service: Vascular;  Laterality: N/A;  . AORTIC ARCH ANGIOGRAPHY N/A 10/31/2017   Procedure: AORTIC ARCH ANGIOGRAPHY;  Surgeon: Elam Dutch, MD;  Location: [facility] CV LAB;  Service: Cardiovascular;  Laterality: N/A;  . APPLICATION OF WOUND VAC  11/28/2017   Procedure: APPLICATION OF WOUND VAC;  Surgeon: Serafina Mitchell, MD;  Location: [facility] OR;  Service: Vascular;;  . APPLICATION OF WOUND VAC Left 03/27/2018   Procedure: APPLICATION OF WOUND VAC LEFT GROIN USING PREVENA PLUS;  Surgeon: Serafina Mitchell, MD;  Location: [facility];  Service: Vascular;  Laterality: Left;  . ARTERIAL BYPASS SURGERY   07/05/2010   Right Common Femoral to below knee popliteal BPG  . BACK SURGERY     X's  2  . BIOPSY  03/13/2021   Procedure: BIOPSY;  Surgeon: Harvel Quale, MD;  [facility];  Service: Gastroenterology;;  . CARDIAC CATHETERIZATION    . CHOLECYSTECTOMY     Gall Bladder  . COLONOSCOPY WITH PROPOFOL N/A 03/13/2021   Procedure: COLONOSCOPY WITH PROPOFOL;  Surgeon: Harvel Quale, MD;  [facility];  Service: Gastroenterology;  Laterality: N/A;  with random colonic biopsies  . CYSTECTOMY Left    wrist  . EMBOLECTOMY Left 11/28/2017   Procedure: Left Lower Extremity Embolectomy, Left Tibial Peroneal Trunk Endarterectomy with Patch Angioplasty; Vein Harvest Small Saphenous Graft Left Lower Leg;  Surgeon: [name], MD;  Location: [facility];  Service: Vascular;  Laterality: Left;  . ESOPHAGOGASTRODUODENOSCOPY (EGD) WITH PROPOFOL N/A 03/13/2021   Procedure: ESOPHAGOGASTRODUODENOSCOPY (EGD) WITH PROPOFOL;  Surgeon: Harvel Quale, MD;  [facility];  Service: Gastroenterology;  Laterality: N/A;  .  EYE SURGERY Left 02/23/2020   Dr. Zadie Rhine  . FEMORAL-POPLITEAL BYPASS GRAFT Left 03/27/2018   Procedure: THROMBECTOMY OF LEFT FEMORAL TIBIAL BYPASS;  Surgeon: Serafina Mitchell, MD;  Location: [facility];  Service: Vascular;  Laterality: Left;  . FEMORAL-TIBIAL BYPASS GRAFT Left 03/27/2018   Procedure: BYPASS GRAFT FEMORAL-TIBIAL ARTERY LEFT REDO USING CRYOPRESERVED SAPHENOUS VEIN 70cm;  Surgeon: Serafina Mitchell, MD;  Location: [facility] OR;  Service: Vascular;  Laterality: Left;  . INTERCOSTAL NERVE BLOCK  November 2015  . INTRAOPERATIVE ARTERIOGRAM  11/28/2017   Procedure: INTRA OPERATIVE ARTERIOGRAM OF LEFT LEG;  Surgeon: Serafina Mitchell, MD;  Location: [place];  Service: Vascular;;  . INTRAOPERATIVE ARTERIOGRAM Left 03/27/2018   Procedure: INTRA OPERATIVE ARTERIOGRAM TIMES TWO;  Surgeon: Serafina Mitchell, MD;  Location: [facility];  Service: Vascular;  Laterality: Left;  . IR FLUORO GUIDE CV LINE RIGHT  03/20/2017  .  IR FLUORO GUIDE CV LINE RIGHT  05/05/2018  . IR REMOVAL TUN CV CATH W/O FL  05/20/2018  . IR US GUIDE VASC ACCESS RIGHT  03/20/2017  . IR US GUIDE VASC ACCESS RIGHT  05/05/2018  . IRRIGATION AND DEBRIDEMENT BUTTOCKS Right 09/30/2016   Procedure: DEBRIDEMENT RIGHT  BUTTOCK WOUND;  Surgeon: Georganna Skeans, MD;  Location: [facility];  Service: General;  Laterality: Right;  . left foot surgery    . PERIPHERAL VASCULAR CATHETERIZATION N/A 05/07/2016   Procedure: Abdominal Aortogram;  Surgeon: Serafina Mitchell, MD;  Location: [place] CV LAB;  Service: Cardiovascular;  Laterality: N/A;  . PERIPHERAL VASCULAR CATHETERIZATION N/A 05/07/2016   Procedure: Lower Extremity Angiography;  Surgeon: Serafina Mitchell, MD;  Location: [place] CV LAB;  Service: Cardiovascular;  Laterality: N/A;  . PERIPHERAL VASCULAR CATHETERIZATION N/A 05/07/2016   Procedure: Aortic Arch Angiography;  Surgeon: Serafina Mitchell, MD;  Location: [place] CV LAB;  Service: Cardiovascular;  Laterality: N/A;  . PERIPHERAL VASCULAR CATHETERIZATION  N/A 05/07/2016   Procedure: Upper Extremity Angiography;  Surgeon: Serafina Mitchell, MD;  Location: [place] CV LAB;  Service: Cardiovascular;  Laterality: N/A;  . PERIPHERAL VASCULAR CATHETERIZATION Right 05/07/2016   Procedure: Peripheral Vascular Balloon Angioplasty;  Surgeon: Serafina Mitchell, MD;  Location: [place] CV LAB;  Service: Cardiovascular;  Laterality: Right;  subclavian  . PERIPHERAL VASCULAR CATHETERIZATION Right 05/07/2016   Procedure: Peripheral Vascular Intervention;  Surgeon: Serafina Mitchell, MD;  Location: [place] CV LAB;  Service: Cardiovascular;  Laterality: Right;  External  Iliac  . POLYPECTOMY  03/13/2021   Procedure: POLYPECTOMY;  Surgeon: Harvel Quale, MD;  [facility];  Service: Gastroenterology;;  . SKIN GRAFT Right 2012   [name] by Dr. Nils Pyle- Right and Left Ankle  . SPINE SURGERY    . THROMBECTOMY FEMORAL ARTERY  11/28/2017   Procedure: THROMBECTOMY  & REVISION OF BILATERAL FEMORAL TO POPLETEAL ARTERIES;  Surgeon: Serafina Mitchell, MD;  Location: [facility] [place];  Service: Vascular;;  . TONSILLECTOMY    . TRANSMETATARSAL AMPUTATION Right 10/07/2020   Procedure: RIGHT BELOW KNEE AMPUTATION;  Surgeon: Erle Crocker, MD;  Location: [facility];  Service: Orthopedics;  Laterality: Right;     OB History   No obstetric history on file.     Family History  Problem Relation Age of Onset  . Coronary artery disease Mother   . Peripheral vascular disease Mother   . Heart disease Mother        Before age 108  . Other Mother        Venous insuffiency  . Diabetes Mother   . Hyperlipidemia Mother   . Hypertension Mother   . Varicose Veins Mother   . Heart attack Mother        before age 57  . Heart disease Father   . Diabetes Father   . Diabetes Maternal Grandmother   . Diabetes Paternal Grandmother   . Diabetes Paternal Grandfather   . Diabetes Sister   . Hypertension Sister   . Diabetes Brother   . Hypertension Brother     Social  History   Tobacco Use  . Smoking status: Current Every Day Smoker    Packs/day: 0.50    Years: 30.00    Pack years: 15.00    Types: Cigarettes  . Smokeless tobacco: Never Used  Vaping Use  . Vaping Use: Never used  Substance Use Topics  . Alcohol use: No  Alcohol/week: 0.0 standard drinks  . Drug use: No    Home Medications Prior to Admission medications   Medication Sig Start Date End Date Taking? Authorizing Provider  albuterol (PROVENTIL) 2 MG tablet Take 1 tablet (2 mg total) by mouth daily as needed for shortness of breath. 12/05/20   Gerlene Fee, NP  amiodarone (PACERONE) 200 MG tablet Take 1 tablet (200 mg total) by mouth See admin instructions. oral amiodarone 400mg  bid x 7 days, then 200mg  bid x 2 weeks, then 200mg  daily after that 12/05/20   Gerlene Fee, NP  amitriptyline (ELAVIL) 25 MG tablet Take 1 tablet (25 mg total) by mouth at bedtime. 12/05/20   Gerlene Fee, NP  Balsam [place]-Castor Oil [facility]) OINT Apply 1 application topically 3 (three) times daily. Every shift to bilateral buttocks, coccyx, and sacrum    [provider]  dicyclomine (BENTYL) 10 MG capsule Take 1 capsule (10 mg total) by mouth every 6 (six) hours as needed for spasms. 12/05/20   Gerlene Fee, NP  insulin lispro (HUMALOG KWIKPEN) 100 UNIT/ML KwikPen Inject 4-10 Units into the skin 3 (three) times daily with meals. If blood sugar 121-150, 2 units If blood sugar 151-200, 4 units If blood sugar 201-250, 6 units If blood sugar 251-300, 8 units If blood sugar 301-500, 10 units If blood sugar greater than 350 call MD 12/05/20   Gerlene Fee, NP  loperamide (IMODIUM) 2 MG capsule Take 2 capsules (4 mg total) by mouth 2 (two) times daily. 03/16/21   Kathie Dike, MD  metoprolol tartrate (LOPRESSOR) 25 MG tablet Take 0.5 tablets (12.5 mg total) by mouth 2 (two) times daily. 12/05/20   Gerlene Fee, NP  naloxone [facility]) nasal spray 4 mg/0.1 mL Place 1 spray into the nose once as  needed (opioid overdose). 12/05/20   Gerlene Fee, NP  nicotine (NICODERM CQ - DOSED IN MG/24 HOURS) 21 mg/24hr patch Place 21 mg onto the skin daily. Rotate sites and remove old patch prior to application    [provider]  omeprazole (PRILOSEC) 40 MG capsule Take 1 capsule (40 mg total) by mouth daily. 12/05/20   Gerlene Fee, NP  Potassium Chloride ER 20 MEQ TBCR Take 20 mEq by mouth daily. 1 tab daily by mouth 12/05/20   Gerlene Fee, NP  pregabalin (LYRICA) 100 MG capsule Take 1 capsule (100 mg total) by mouth 2 (two) times daily. 03/16/21   Kathie Dike, MD  Rivaroxaban (XARELTO) 15 MG TABS tablet Take 1 tablet (15 mg total) by mouth daily with supper. 12/05/20   Gerlene Fee, NP  rosuvastatin (CRESTOR) 20 MG tablet Take 1 tablet (20 mg total) by mouth daily at 6 PM. 12/05/20   Nyoka Cowden, Phylis Bougie, NP  sodium bicarbonate 650 MG tablet Take 1 tablet (650 mg total) by mouth 2 (two) times daily. 12/05/20   Gerlene Fee, NP  torsemide (DEMADEX) 20 MG tablet Take 1 tablet (20 mg total) by mouth daily. 12/05/20   Gerlene Fee, NP  vitamin B-12 1000 MCG tablet Take 1 tablet (1,000 mcg total) by mouth daily. 10/12/20   Pahwani, Michell Heinrich, MD    Allergies    Lactose intolerance (gi)  Review of Systems   Review of Systems  Constitutional: Negative for chills and fever.  HENT: Negative for congestion and sore throat.   Eyes: Negative.   Respiratory: Negative for chest tightness and shortness of breath.   Cardiovascular: Negative for chest  pain.  Gastrointestinal: Positive for abdominal pain, diarrhea, nausea and vomiting.  Genitourinary: Negative.   Musculoskeletal: Negative for arthralgias, joint swelling and neck pain.  Skin: Negative.  Negative for rash and wound.  Neurological: Negative for dizziness, weakness, light-headedness, numbness and headaches.  Psychiatric/Behavioral: Negative.   All other systems reviewed and are negative.   Physical Exam Updated Vital  Signs BP (!) 147/81 (BP Location: Right Arm)   Pulse 74   Temp 98.1 F (36.7 C) (Oral)   Resp 16   Ht 5\' 7"  (1.702 m)   Wt 74.8 kg   SpO2 98%   BMI 25.84 kg/m   Physical Exam Vitals and nursing note reviewed.  Constitutional:      Appearance: She is well-developed.     Comments: Tearful  HENT:     Head: Normocephalic and atraumatic.     Mouth/Throat:     Mouth: Mucous membranes are moist.  Eyes:     Conjunctiva/sclera: Conjunctivae normal.  Cardiovascular:     Rate and Rhythm: Normal rate and regular rhythm.     Heart sounds: Normal heart sounds.  Pulmonary:     Effort: Pulmonary effort is normal.     Breath sounds: Normal breath sounds. No wheezing.  Abdominal:     General: Abdomen is protuberant. Bowel sounds are normal.     Palpations: Abdomen is soft.     Tenderness: There is abdominal tenderness in the left lower quadrant. There is no guarding or rebound.  Musculoskeletal:     Cervical back: Normal range of motion.     Comments: bka right, aka left  Skin:    General: Skin is warm and dry.  Neurological:     Mental Status: She is alert.     ED Results / Procedures / Treatments   Labs (all labs ordered are listed, but only abnormal results are displayed) Labs Reviewed  CBC WITH DIFFERENTIAL/PLATELET - Abnormal; Notable for the following components:      Result Value   RBC 3.57 (*)    Hemoglobin 9.4 (*)    HCT 30.3 (*)    RDW 20.5 (*)    Lymphs Abs 0.5 (*)    All other components within normal limits  COMPREHENSIVE METABOLIC PANEL - Abnormal; Notable for the following components:   Potassium 2.7 (*)    Creatinine, Ser 1.23 (*)    Calcium 6.6 (*)    Total Protein 5.3 (*)    Albumin 2.1 (*)    AST 11 (*)    GFR, Estimated 52 (*)    All other components within normal limits  LIPASE, BLOOD  URINALYSIS, ROUTINE W REFLEX MICROSCOPIC    EKG None  Radiology No results found.  Procedures Procedures   Medications Ordered in ED Medications   potassium chloride 10 mEq in 100 mL IVPB (has no administration in time range)    ED Course  I have reviewed the triage vital signs and the nursing notes.  Pertinent labs & imaging results that were available during my care of the patient were reviewed by me and considered in my medical decision making (see chart for details).    MDM Rules/Calculators/A&P                          Pt with intractable diarrhea despite recent admission for same, endo colon, stool cultures, c dif negative. Presents with weakness, hypokalemia and hypocalcemia secondary to persistent diarrhea.  She will require admission for replacement of  potassium and calcium, potassium run ordered, calcium pending, ekg ordered.    Call to GI for consultation.  Spoke with Dr. Laural Golden who will consult pt, requested repeat CRP which has been added, may need to consider adding sandostatin as this may be a secretory diarrhea - but wants to consult pt prior to adding this.  Requests hospitalist admission.  Discussed with Dr Waldron Labs who will assess pt in ed.  Final Clinical Impression(s) / ED Diagnoses Final diagnoses:  Diarrhea, unspecified type  Hypokalemia  Hypocalcemia    Rx / DC Orders ED Discharge Orders    None       Landis Martins 03/28/21 1654    Milton Ferguson, MD 03/29/21 [phone]

## 2021-03-28 NOTE — ED Notes (Signed)
Date and time results received: 03/28/21 2117  Test: covid Critical Value: +  Name of Provider Notified: Dr. Waldron Labs  Orders Received? Or Actions Taken?: n/a

## 2021-03-28 NOTE — H&P (Addendum)
[facility] H&P   Patient Demographics:    [name], is a 55 y.o. female  MRN: [id number]   DOB - [birth year]  Admit Date - 03/28/2021  Outpatient Primary MD for the patient is Adamo, [name], MD  Referring MD/NP/PA: PA Idol  Patient coming from: Home  Chief Complaint  Patient presents with  . Diarrhea      HPI:    [name]  is a 55 y.o. female, with type 2 diabetes mellitus, lipidemia, stage IV CKD, hypertension, ischemic cardiomyopathy with CAD, GERD, history of non-STEMI, paroxysmal A. fib, bilateral amputee, with known history of persistent diarrhea, recent admission and discharged on 4/29 due to electrolyte abnormalities from her diarrhea, with extensive work-up including endoscopy, colonoscopy, nonrevealing, GI panel, C. difficile, celiac disease with no abnormal findings, recent hospitalization symptoms improved with avoiding dairy products, and Imodium, patient presents with diarrhea. -Patient reports her diarrhea did not improve, has been persistent for last 12 days since her discharge, reports she has been using Imodium 2 to 3/day, she only used 1 yesterday as she was out of it, reports around 12 watery bowel movements per day, she is blind, but she denies having any melena or blood in her diarrhea as her husband has been helping to clean her, reports she finished  pack of depends over the last 2 days(25 pieces), which prompted her to come to ED, reports she is weak, she is frail, reports she has been compliant with her medication, she denies any dairy product use, fever, no chills, no abdominal pain, she does report occasional nausea and vomiting. -In ED her work-up significant for potassium 2.7, calcium of 6.6, albumin improved at 2.1, creatinine improved to 1.23, he had no leukocytosis, no QTC prolongation, [facility] hospital[facility] consulted to admit.    Review of systems:    In  addition to the HPI above,  No Fever-chills, she reports weakness and fatigue No Headache, No changes with Vision or hearing, No problems swallowing food or Liquids, No Chest pain, Cough or Shortness of Breath, No Abdominal pain, reports some  Nausea and Vommitting, she reports diarrhea No Blood in stool or Urine, No dysuria, No new skin rashes or bruises, No new joints pains-aches,  No new weakness, tingling, numbness in any extremity, No recent weight gain or loss, No polyuria, polydypsia or polyphagia, No significant Mental Stressors.  A full 10 point Review of Systems was done, except as stated above, all other Review of Systems were negative.   With Past History of the following :    Past Medical History:  Diagnosis Date  . Abnormal stress test    a. 02/2017 MV: large region of fixed perfusion defect in basal to mid inf, mid-dist inflat walls, EF 43%. No ischemia (EF 55-60% by f/u echo).  . Arthritis   . Asthma   . Carotid arterial disease ([place])    a. 09/2017  Carotid U/S: 40-49% bilat ICA stenosis.  . Chronic back pain   . Coronary artery calcification seen on CT scan    a. 11/2017 CT Abd/Pelvis: Multi vessel coronary vascular Ca2+.  . Depression   . Diabetes mellitus   . Diabetic neuropathy ([place])   . Difficult intubation    DIFFICULT AIRWAY/FYI  . Family history of adverse reaction to anesthesia    mother had difficlty waking   . Femoral-popliteal bypass graft occlusion, left ([place]) 12/02/2017  . GERD (gastroesophageal reflux disease)   . History of echocardiogram    a. 03/2017 Echo: EF 55-60%, mild LVH, nl RV fxn.  . Hyperlipidemia   . Ischemic cardiomyopathy    a. 04/2018 Echo: EF 30-35%, Gr2 DD, mild LVH. Mild MR. Mildly dil LA, mod dil RV w/ mod red RV fxn, mild TR, PASP 34mmHg.  . NSTEMI (non-ST elevated myocardial infarction) ([place])    a. 05/2018 in setting of Afib, sepsis, and post-op L AKA. Peak trop 7.7. EF 30-35% by echo-->cath not performed 2/2 renal failure.   . 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 PAD (peripheral artery disease) ([place])    a. S/p L fem-pop bypass; b. 11/2017 s/p Aortobifem bypass 2/2 graft occlusion; c. 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  . Paroxysmal atrial fibrillation with rapid ventricular response ([place]) 12/02/2017   a. CHA2DS2VASc = 3-->Xarelto; b. 05/2018 Recurrent Afib-->amio.  Marland Kitchen Renal insufficiency   . Ulcer    Foot      Past Surgical History:  Procedure Laterality Date  . ABDOMINAL AORTAGRAM  June 15, 2014  . ABDOMINAL AORTAGRAM N/A 06/15/2014   Procedure: ABDOMINAL Maxcine Ham;  Surgeon: Serafina Mitchell, MD;  Location: [facility] CATH LAB;  Service: Cardiovascular;  Laterality: N/A;  . ABDOMINAL AORTAGRAM N/A 11/22/2014   Procedure: ABDOMINAL AORTAGRAM;  Surgeon: Serafina Mitchell, MD;  Location: [facility] CATH LAB;  Service: Cardiovascular;  Laterality: N/A;  . ABDOMINAL AORTOGRAM W/LOWER EXTREMITY N/A 01/07/2017   Procedure: Abdominal Aortogram w/Lower Extremity;  Surgeon: Serafina Mitchell, MD;  Location: [place] CV LAB;  Service: Cardiovascular;  Laterality: N/A;  . ABDOMINAL AORTOGRAM W/LOWER EXTREMITY N/A 10/31/2017   Procedure: ABDOMINAL AORTOGRAM W/LOWER EXTREMITY;  Surgeon: Elam Dutch, MD;  Location: [place] CV LAB;  Service: Cardiovascular;  Laterality: N/A;  . ABDOMINAL AORTOGRAM W/LOWER EXTREMITY N/A 03/24/2018   Procedure: ABDOMINAL AORTOGRAM W/LOWER EXTREMITY;  Surgeon: Serafina Mitchell, MD;  Location: [place] CV LAB;  Service: Cardiovascular;  Laterality: N/A;  . AMPUTATION Left 04/26/2018   Procedure: AMPUTATION ABOVE KNEE;  Surgeon: Elam Dutch, MD;  Location: [place];  Service: Vascular;  Laterality: Left;  . AORTA - BILATERAL FEMORAL ARTERY BYPASS GRAFT N/A 11/28/2017   Procedure: AORTA BIFEMORAL BYPASS USING HEMASHIELD GOLD GRAFT & REIMPLANT IMA;  Surgeon: Serafina Mitchell, MD;  Location: [facility] OR;  Service: Vascular;  Laterality: N/A;  . AORTIC ARCH  ANGIOGRAPHY N/A 10/31/2017   Procedure: AORTIC ARCH ANGIOGRAPHY;  Surgeon: Elam Dutch, MD;  Location: [place] CV LAB;  Service: Cardiovascular;  Laterality: N/A;  . APPLICATION OF WOUND VAC  11/28/2017   Procedure: APPLICATION OF WOUND VAC;  Surgeon: Serafina Mitchell, MD;  Location: [facility] OR;  Service: Vascular;;  . APPLICATION OF WOUND VAC Left 03/27/2018   Procedure: APPLICATION OF WOUND VAC LEFT GROIN USING PREVENA PLUS;  Surgeon: Serafina Mitchell, MD;  Location: [place];  Service: Vascular;  Laterality: Left;  . ARTERIAL BYPASS SURGERY  07/05/2010   Right Common Femoral to below knee popliteal BPG  . BACK SURGERY     X's  2  . BIOPSY  03/13/2021   Procedure: BIOPSY;  Surgeon: Harvel Quale, MD;  [facility];  Service: Gastroenterology;;  . CARDIAC CATHETERIZATION    . CHOLECYSTECTOMY     Gall Bladder  . COLONOSCOPY WITH PROPOFOL N/A 03/13/2021   Procedure: COLONOSCOPY WITH PROPOFOL;  Surgeon: Harvel Quale, MD;  [facility];  Service: Gastroenterology;  Laterality: N/A;  with random colonic biopsies  . CYSTECTOMY Left    wrist  . EMBOLECTOMY Left 11/28/2017   Procedure: Left Lower Extremity Embolectomy, Left Tibial Peroneal Trunk Endarterectomy with Patch Angioplasty; Vein Harvest Small Saphenous Graft Left Lower Leg;  Surgeon: [name], MD;  Location: [place];  Service: Vascular;  Laterality: Left;  . ESOPHAGOGASTRODUODENOSCOPY (EGD) WITH PROPOFOL N/A 03/13/2021   Procedure: ESOPHAGOGASTRODUODENOSCOPY (EGD) WITH PROPOFOL;  Surgeon: Harvel Quale, MD;  [facility];  Service: Gastroenterology;  Laterality: N/A;  . EYE SURGERY Left 02/23/2020   Dr. Zadie Rhine  . FEMORAL-POPLITEAL BYPASS GRAFT Left 03/27/2018   Procedure: THROMBECTOMY OF LEFT FEMORAL TIBIAL BYPASS;  Surgeon: Serafina Mitchell, MD;  Location: [place];  Service: Vascular;  Laterality: Left;  . FEMORAL-TIBIAL BYPASS GRAFT Left 03/27/2018    Procedure: BYPASS GRAFT FEMORAL-TIBIAL ARTERY LEFT REDO USING CRYOPRESERVED SAPHENOUS VEIN 70cm;  Surgeon: Serafina Mitchell, MD;  Location: [facility] OR;  Service: Vascular;  Laterality: Left;  . INTERCOSTAL NERVE BLOCK  November 2015  . INTRAOPERATIVE ARTERIOGRAM  11/28/2017   Procedure: INTRA OPERATIVE ARTERIOGRAM OF LEFT LEG;  Surgeon: Serafina Mitchell, MD;  Location: [place];  Service: Vascular;;  . INTRAOPERATIVE ARTERIOGRAM Left 03/27/2018   Procedure: INTRA OPERATIVE ARTERIOGRAM TIMES TWO;  Surgeon: Serafina Mitchell, MD;  Location: [place];  Service: Vascular;  Laterality: Left;  . IR FLUORO GUIDE CV LINE RIGHT  03/20/2017  . IR FLUORO GUIDE CV LINE RIGHT  05/05/2018  . IR REMOVAL TUN CV CATH W/O FL  05/20/2018  . IR US GUIDE VASC ACCESS RIGHT  03/20/2017  . IR US GUIDE VASC ACCESS RIGHT  05/05/2018  . IRRIGATION AND DEBRIDEMENT BUTTOCKS Right 09/30/2016   Procedure: DEBRIDEMENT RIGHT  BUTTOCK WOUND;  Surgeon: Georganna Skeans, MD;  Location: [place];  Service: General;  Laterality: Right;  . left foot surgery    . PERIPHERAL VASCULAR CATHETERIZATION N/A 05/07/2016   Procedure: Abdominal Aortogram;  Surgeon: Serafina Mitchell, MD;  Location: [place] CV LAB;  Service: Cardiovascular;  Laterality: N/A;  . PERIPHERAL VASCULAR CATHETERIZATION N/A 05/07/2016   Procedure: Lower Extremity Angiography;  Surgeon: Serafina Mitchell, MD;  Location: [place] CV LAB;  Service: Cardiovascular;  Laterality: N/A;  . PERIPHERAL VASCULAR CATHETERIZATION N/A 05/07/2016   Procedure: Aortic Arch Angiography;  Surgeon: Serafina Mitchell, MD;  Location: [place] CV LAB;  Service: Cardiovascular;  Laterality: N/A;  . PERIPHERAL VASCULAR CATHETERIZATION N/A 05/07/2016   Procedure: Upper Extremity Angiography;  Surgeon: Serafina Mitchell, MD;  Location: [place] CV LAB;  Service: Cardiovascular;  Laterality: N/A;  . PERIPHERAL VASCULAR CATHETERIZATION Right 05/07/2016   Procedure: Peripheral Vascular Balloon Angioplasty;  Surgeon: Serafina Mitchell, MD;  Location: [place] CV LAB;  Service: Cardiovascular;  Laterality: Right;  subclavian  . PERIPHERAL VASCULAR CATHETERIZATION Right 05/07/2016   Procedure: Peripheral Vascular Intervention;  Surgeon: Serafina Mitchell, MD;  Location: [place] CV LAB;  Service: Cardiovascular;  Laterality: Right;  External  Iliac  . POLYPECTOMY  03/13/2021   Procedure: POLYPECTOMY;  Surgeon: Harvel Quale, MD;  [facility];  Service: Gastroenterology;;  . SKIN GRAFT Right 2012   [name] by Dr. Nils Pyle- Right and Left Ankle  . SPINE SURGERY    . THROMBECTOMY FEMORAL ARTERY  11/28/2017   Procedure: THROMBECTOMY  & REVISION OF BILATERAL FEMORAL TO POPLETEAL ARTERIES;  Surgeon: Serafina Mitchell, MD;  Location: [place];  Service: Vascular;;  . TONSILLECTOMY    . TRANSMETATARSAL AMPUTATION Right 10/07/2020   Procedure: RIGHT BELOW KNEE AMPUTATION;  Surgeon: Erle Crocker, MD;  Location: [place];  Service: Orthopedics;  Laterality: Right;      Social History:     Social History   Tobacco Use  . Smoking status: Current Every Day Smoker    Packs/day: 0.50    Years: 30.00    Pack years: 15.00    Types: Cigarettes  . Smokeless tobacco: Never Used  Substance Use Topics  . Alcohol use: No    Alcohol/week: 0.0 standard drinks        Family History :     Family History  Problem Relation Age of Onset  . Coronary artery disease Mother   . Peripheral vascular disease Mother   . Heart disease Mother        Before age 71  . Other Mother        Venous insuffiency  . Diabetes Mother   . Hyperlipidemia Mother   . Hypertension Mother   . Varicose Veins Mother   . Heart attack Mother        before age 44  . Heart disease Father   . Diabetes Father   . Diabetes Maternal Grandmother   . Diabetes Paternal Grandmother   . Diabetes Paternal Grandfather   . Diabetes Sister   . Hypertension Sister   . Diabetes Brother   . Hypertension Brother       Home  Medications:   Prior to Admission medications   Medication Sig Start Date End Date Taking? Authorizing Provider  albuterol (PROVENTIL) 2 MG tablet Take 1 tablet (2 mg total) by mouth daily as needed for shortness of breath. 12/05/20   Gerlene Fee, NP  amiodarone (PACERONE) 200 MG tablet Take 1 tablet (200 mg total) by mouth See admin instructions. oral amiodarone 400mg  bid x 7 days, then 200mg  bid x 2 weeks, then 200mg  daily after that 12/05/20   Gerlene Fee, NP  amitriptyline (ELAVIL) 25 MG tablet Take 1 tablet (25 mg total) by mouth at bedtime. 12/05/20   Gerlene Fee, NP  Balsam [place]-Castor Oil [facility]) OINT Apply 1 application topically 3 (three) times daily. Every shift to bilateral buttocks, coccyx, and sacrum    [provider]  dicyclomine (BENTYL) 10 MG capsule Take 1 capsule (10 mg total) by mouth every 6 (six) hours as needed for spasms. 12/05/20   Gerlene Fee, NP  insulin lispro (HUMALOG KWIKPEN) 100 UNIT/ML KwikPen Inject 4-10 Units into the skin 3 (three) times daily with meals. If blood sugar 121-150, 2 units If blood sugar 151-200, 4 units If blood sugar 201-250, 6 units If blood sugar 251-300, 8 units If blood sugar 301-500, 10 units If blood sugar greater than 350 call MD 12/05/20   Gerlene Fee, NP  loperamide (IMODIUM) 2 MG capsule Take 2 capsules (4 mg total) by mouth 2 (two) times daily. 03/16/21   Kathie Dike, MD  metoprolol tartrate (LOPRESSOR) 25 MG tablet Take 0.5 tablets (12.5 mg total) by mouth 2 (two) times daily. 12/05/20   Gerlene Fee, NP  naloxone [facility]) nasal spray 4 mg/0.1 mL Place 1 spray into the nose once as needed (opioid overdose). 12/05/20   Gerlene Fee, NP  nicotine (NICODERM CQ - DOSED IN MG/24 HOURS) 21 mg/24hr patch Place 21 mg onto the skin daily. Rotate sites and remove old patch prior to application    [provider]  omeprazole (PRILOSEC) 40 MG capsule Take 1 capsule (40 mg total) by mouth daily. 12/05/20    Gerlene Fee, NP  Potassium Chloride ER 20 MEQ TBCR Take 20 mEq by mouth daily. 1 tab daily by mouth 12/05/20   Gerlene Fee, NP  pregabalin (LYRICA) 100 MG capsule Take 1 capsule (100 mg total) by mouth 2 (two) times daily. 03/16/21   Kathie Dike, MD  Rivaroxaban (XARELTO) 15 MG TABS tablet Take 1 tablet (15 mg total) by mouth daily with supper. 12/05/20   Gerlene Fee, NP  rosuvastatin (CRESTOR) 20 MG tablet Take 1 tablet (20 mg total) by mouth daily at 6 PM. 12/05/20   Nyoka Cowden, Phylis Bougie, NP  sodium bicarbonate 650 MG tablet Take 1 tablet (650 mg total) by mouth 2 (two) times daily. 12/05/20   Gerlene Fee, NP  torsemide (DEMADEX) 20 MG tablet Take 1 tablet (20 mg total) by mouth daily. 12/05/20   Gerlene Fee, NP  vitamin B-12 1000 MCG tablet Take 1 tablet (1,000 mcg total) by mouth daily. 10/12/20   Pahwani, Michell Heinrich, MD     Allergies:     Allergies  Allergen Reactions  . Lactose Intolerance (Gi) Diarrhea     Physical Exam:   Vitals  Blood pressure 137/76, pulse 74, temperature 98.1 F (36.7 C), temperature source Oral, resp. rate 13, height 5\' 7"  (1.702 m), weight 74.8 kg, SpO2 98 %.    1. General developed female, laying in bed, no apparent distress, legally blind  2. Normal affect and insight, Not Suicidal or Homicidal, Awake Alert, Oriented X 3.  3. No F.N deficits, ALL C.Nerves Intact, Strength 5/5 all 4 extremities, legally blind  4. Ears  appear Normal,. Moist Oral Mucosa.  Poor dentition  5. Supple Neck, No JVD, No cervical lymphadenopathy appriciated, No Carotid Bruits.  6. Symmetrical Chest wall movement, Good air movement bilaterally, CTAB.  7. RRR, No Gallops, Rubs or Murmurs, No Parasternal Heave.  8. Positive Bowel Sounds, Abdomen Soft, No tenderness, No organomegaly appriciated,No rebound -guarding or rigidity.  9.  No Cyanosis, Normal Skin Turgor, No Skin Rash or Bruise.  10. Good muscle tone,  no effusions, bilateral lower extremity  amputations  11. No Palpable Lymph Nodes in Neck or Axillae     Data Review:    CBC Recent Labs  Lab 03/28/21 1420  WBC 5.7  HGB 9.4*  HCT 30.3*  PLT 172  MCV 84.9  MCH 26.3  MCHC 31.0  RDW 20.5*  LYMPHSABS 0.5*  MONOABS 0.3  EOSABS 0.1  BASOSABS 0.0   ------------------------------------------------------------------------------------------------------------------  Chemistries  Recent Labs  Lab 03/28/21 1420  NA 137  K 2.7*  CL 107  CO2 24  GLUCOSE 85  BUN 7  CREATININE 1.23*  CALCIUM 6.6*  AST 11*  ALT 11  ALKPHOS 92  BILITOT 0.5   ------------------------------------------------------------------------------------------------------------------ estimated creatinine clearance is 53.9 mL/min (A) (by C-G formula based on SCr of 1.23 mg/dL (H)). ------------------------------------------------------------------------------------------------------------------ No results for  input(s): TSH, T4TOTAL, T3FREE, THYROIDAB in the last 72 hours.  Invalid input(s): FREET3  Coagulation profile No results for input(s): INR, PROTIME in the last 168 hours. ------------------------------------------------------------------------------------------------------------------- No results for input(s): DDIMER in the last 72 hours. -------------------------------------------------------------------------------------------------------------------  Cardiac Enzymes No results for input(s): CKMB, TROPONINI, MYOGLOBIN in the last 168 hours.  Invalid input(s): CK ------------------------------------------------------------------------------------------------------------------    Component Value Date/Time   BNP 2,905.0 (H) 12/01/2020 0605     ---------------------------------------------------------------------------------------------------------------  Urinalysis    Component Value Date/Time   COLORURINE YELLOW 11/30/2020 0108   APPEARANCEUR HAZY (A) 11/30/2020 0108    LABSPEC 1.010 11/30/2020 0108   PHURINE 5.0 11/30/2020 0108   GLUCOSEU NEGATIVE 11/30/2020 0108   HGBUR SMALL (A) 11/30/2020 0108   BILIRUBINUR NEGATIVE 11/30/2020 0108   KETONESUR NEGATIVE 11/30/2020 0108   PROTEINUR 30 (A) 11/30/2020 0108   UROBILINOGEN 0.2 06/25/2011 1707   NITRITE NEGATIVE 11/30/2020 0108   LEUKOCYTESUR TRACE (A) 11/30/2020 0108    ----------------------------------------------------------------------------------------------------------------   Imaging Results:    No results found.  My personal review of EKG: Rhythm NSR, Rate  75 /min, QTc487   Assessment & Plan:    Active Problems:   Atrial fibrillation with rapid ventricular response (HCC)   Diabetic peripheral neuropathy (HCC)   Chronic combined systolic (congestive) and diastolic (congestive) heart failure (HCC)   Coronary artery disease involving native coronary artery of native heart without angina pectoris   CKD stage 4 due to type 2 diabetes mellitus ([place])   Hyperlipidemia associated with type 2 diabetes mellitus ([place])   Diarrhea  Addendum -Patient screening COVID-19 came back positive, reports she is vaccinated, but not boosted, discussed remdesivir with her, she is agreeable, will do 3 days, there is a possibility COVID might be contributing to her diarrhea as well.  Hypokalemia/hyponatremia/electrolyte abnormalities -In the setting of profuse diarrhea, will replete potassium with p.o. and IV, will replete hypocalcemia as well, will check magnesium level and replete as needed. -We will be monitored on telemetry for next 24 hours until her electrolyte is corrected.  Intractable chronic diarrhea -Patient with extensive work-up, her diarrhea felt acute on chronic, multifactorial in the setting of Actos intolerance, IBS and bile salt diarrhea. -Tensive work-up include colonoscopy/endoscopy, serologies are negative, C. difficile is negative, GI panel is negative during recent admissions, random  biopsies with no need to suggest reason for her diarrhea. -Resume her on Imodium once hypokalemia is corrected -GI is consulted, will await further recommendations  Possible malabsorption process with low albumin and B12 -Celiac work-up is negative, continue with B12 supplements  A. fib -Likely normal sinus rhythm, continue with metoprolol, resume amiodarone once potassium is corrected, continue with Xarelto  Chronic combined systolic/diastolic CHF - Last 2D echocardiogram 11/2020 with LVEF 35-40% -No evidence of volume overload, will hold torsemide given her volume overload  Controlled type 2 diabetes -A1c is 5.7  - Continue with insulin sliding scale  CKD stageIIIb -renal function at baseline,  Peripheral vascular disease with bilateral above-knee amputations -Continue with statin  diabetic neuropathy -Continue with Lyrica, home amitriptyline once potassium is corrected  GERD -Continue with PPI  Tobacco abuse -Counseled, reports she is planning to quit, continue with nicotine patch    DVT Prophylaxis on xarelto  AM Labs Ordered, also please review Full Orders  Family Communication: Admission, patients condition and plan of care including tests being ordered have been discussed with the patient  who indicate understanding and agree with the plan and Code Status.  Code Status Full  Likely  DC to  Home  Condition GUARDED    Consults called: GI    Admission status: Observation    Time spent in minutes : 60 minutes   Phillips Climes M.D on 03/28/2021 at 4:55 PM   [name] - Office  [phone]

## 2021-03-28 NOTE — Progress Notes (Signed)
Pharmacy Consult: Remdesivir  [name] is a 55 y.o. female admitted on 03/28/2021 found to be COVID positive. Pharmacy consulted to initiate remdesivir.   Plan: Give remdesivir 200 mg IV x1 dose; then 100 mg IV daily for 2 doses (total of 3 days)  Thank you for allowing pharmacy to be a part of this patient's care.  Gillian Scarce, PharmD 03/28/2021 9:40 PM

## 2021-03-29 ENCOUNTER — Observation Stay (HOSPITAL_COMMUNITY): Payer: Medicare Other

## 2021-03-29 DIAGNOSIS — K529 Noninfective gastroenteritis and colitis, unspecified: Secondary | ICD-10-CM | POA: Diagnosis present

## 2021-03-29 DIAGNOSIS — U071 COVID-19: Secondary | ICD-10-CM | POA: Diagnosis present

## 2021-03-29 DIAGNOSIS — G8929 Other chronic pain: Secondary | ICD-10-CM | POA: Diagnosis present

## 2021-03-29 DIAGNOSIS — I252 Old myocardial infarction: Secondary | ICD-10-CM | POA: Diagnosis not present

## 2021-03-29 DIAGNOSIS — E871 Hypo-osmolality and hyponatremia: Secondary | ICD-10-CM | POA: Diagnosis present

## 2021-03-29 DIAGNOSIS — R933 Abnormal findings on diagnostic imaging of other parts of digestive tract: Secondary | ICD-10-CM | POA: Diagnosis not present

## 2021-03-29 DIAGNOSIS — E1142 Type 2 diabetes mellitus with diabetic polyneuropathy: Secondary | ICD-10-CM | POA: Diagnosis present

## 2021-03-29 DIAGNOSIS — I4891 Unspecified atrial fibrillation: Secondary | ICD-10-CM | POA: Diagnosis not present

## 2021-03-29 DIAGNOSIS — R197 Diarrhea, unspecified: Secondary | ICD-10-CM | POA: Diagnosis not present

## 2021-03-29 DIAGNOSIS — R338 Other retention of urine: Secondary | ICD-10-CM | POA: Diagnosis not present

## 2021-03-29 DIAGNOSIS — I5042 Chronic combined systolic (congestive) and diastolic (congestive) heart failure: Secondary | ICD-10-CM | POA: Diagnosis present

## 2021-03-29 DIAGNOSIS — L89313 Pressure ulcer of right buttock, stage 3: Secondary | ICD-10-CM | POA: Diagnosis present

## 2021-03-29 DIAGNOSIS — I130 Hypertensive heart and chronic kidney disease with heart failure and stage 1 through stage 4 chronic kidney disease, or unspecified chronic kidney disease: Secondary | ICD-10-CM | POA: Diagnosis present

## 2021-03-29 DIAGNOSIS — R112 Nausea with vomiting, unspecified: Secondary | ICD-10-CM | POA: Diagnosis not present

## 2021-03-29 DIAGNOSIS — E1151 Type 2 diabetes mellitus with diabetic peripheral angiopathy without gangrene: Secondary | ICD-10-CM | POA: Diagnosis present

## 2021-03-29 DIAGNOSIS — I480 Paroxysmal atrial fibrillation: Secondary | ICD-10-CM | POA: Diagnosis present

## 2021-03-29 DIAGNOSIS — I2510 Atherosclerotic heart disease of native coronary artery without angina pectoris: Secondary | ICD-10-CM | POA: Diagnosis present

## 2021-03-29 DIAGNOSIS — D638 Anemia in other chronic diseases classified elsewhere: Secondary | ICD-10-CM | POA: Diagnosis present

## 2021-03-29 DIAGNOSIS — K909 Intestinal malabsorption, unspecified: Secondary | ICD-10-CM | POA: Diagnosis present

## 2021-03-29 DIAGNOSIS — F17210 Nicotine dependence, cigarettes, uncomplicated: Secondary | ICD-10-CM | POA: Diagnosis present

## 2021-03-29 DIAGNOSIS — E739 Lactose intolerance, unspecified: Secondary | ICD-10-CM | POA: Diagnosis present

## 2021-03-29 DIAGNOSIS — E876 Hypokalemia: Secondary | ICD-10-CM | POA: Diagnosis present

## 2021-03-29 DIAGNOSIS — E785 Hyperlipidemia, unspecified: Secondary | ICD-10-CM | POA: Diagnosis present

## 2021-03-29 DIAGNOSIS — R188 Other ascites: Secondary | ICD-10-CM | POA: Diagnosis present

## 2021-03-29 DIAGNOSIS — E8809 Other disorders of plasma-protein metabolism, not elsewhere classified: Secondary | ICD-10-CM | POA: Diagnosis present

## 2021-03-29 DIAGNOSIS — R1030 Lower abdominal pain, unspecified: Secondary | ICD-10-CM | POA: Diagnosis not present

## 2021-03-29 DIAGNOSIS — E1122 Type 2 diabetes mellitus with diabetic chronic kidney disease: Secondary | ICD-10-CM | POA: Diagnosis present

## 2021-03-29 DIAGNOSIS — E1169 Type 2 diabetes mellitus with other specified complication: Secondary | ICD-10-CM | POA: Diagnosis present

## 2021-03-29 LAB — COMPREHENSIVE METABOLIC PANEL
ALT: 10 U/L (ref 0–44)
AST: 10 U/L — ABNORMAL LOW (ref 15–41)
Albumin: 2 g/dL — ABNORMAL LOW (ref 3.5–5.0)
Alkaline Phosphatase: 91 U/L (ref 38–126)
Anion gap: 5 (ref 5–15)
BUN: 6 mg/dL (ref 6–20)
CO2: 22 mmol/L (ref 22–32)
Calcium: 6.6 mg/dL — ABNORMAL LOW (ref 8.9–10.3)
Chloride: 109 mmol/L (ref 98–111)
Creatinine, Ser: 1.24 mg/dL — ABNORMAL HIGH (ref 0.44–1.00)
GFR, Estimated: 51 mL/min — ABNORMAL LOW (ref >60)
Glucose, Bld: 94 mg/dL (ref 70–99)
Potassium: 3.6 mmol/L (ref 3.5–5.1)
Sodium: 136 mmol/L (ref 135–145)
Total Bilirubin: 0.4 mg/dL (ref 0.3–1.2)
Total Protein: 5.3 g/dL — ABNORMAL LOW (ref 6.5–8.1)

## 2021-03-29 LAB — CBC
HCT: 30.6 % — ABNORMAL LOW (ref 36.0–46.0)
Hemoglobin: 9.2 g/dL — ABNORMAL LOW (ref 12.0–15.0)
MCH: 25.8 pg — ABNORMAL LOW (ref 26.0–34.0)
MCHC: 30.1 g/dL (ref 30.0–36.0)
MCV: 86 fL (ref 80.0–100.0)
Platelets: 148 10*3/uL — ABNORMAL LOW (ref 150–400)
RBC: 3.56 MIL/uL — ABNORMAL LOW (ref 3.87–5.11)
RDW: 20.4 % — ABNORMAL HIGH (ref 11.5–15.5)
WBC: 5.3 10*3/uL (ref 4.0–10.5)
nRBC: 0 % (ref 0.0–0.2)

## 2021-03-29 LAB — URINALYSIS, ROUTINE W REFLEX MICROSCOPIC
Bilirubin Urine: NEGATIVE
Glucose, UA: NEGATIVE mg/dL
Ketones, ur: NEGATIVE mg/dL
Nitrite: NEGATIVE
Protein, ur: 100 mg/dL — AB
Specific Gravity, Urine: 1.01 (ref 1.005–1.030)
WBC, UA: >50 WBC/hpf — ABNORMAL HIGH (ref 0–5)
pH: 5 (ref 5.0–8.0)

## 2021-03-29 LAB — GLUCOSE, CAPILLARY
Glucose-Capillary: 100 mg/dL — ABNORMAL HIGH (ref 70–99)
Glucose-Capillary: 131 mg/dL — ABNORMAL HIGH (ref 70–99)
Glucose-Capillary: 143 mg/dL — ABNORMAL HIGH (ref 70–99)

## 2021-03-29 LAB — CBG MONITORING, ED: Glucose-Capillary: 80 mg/dL (ref 70–99)

## 2021-03-29 LAB — C DIFFICILE QUICK SCREEN W PCR REFLEX
C Diff antigen: NEGATIVE
C Diff interpretation: NOT DETECTED
C Diff toxin: NEGATIVE

## 2021-03-29 LAB — MAGNESIUM: Magnesium: 1.8 mg/dL (ref 1.7–2.4)

## 2021-03-29 MED ORDER — IOHEXOL 300 MG/ML  SOLN
100.0000 mL | Freq: Once | INTRAMUSCULAR | Status: AC | PRN
  Administered 2021-03-29: 100 mL via INTRAVENOUS

## 2021-03-29 MED ORDER — ACETAMINOPHEN 325 MG PO TABS
650.0000 mg | ORAL_TABLET | Freq: Four times a day (QID) | ORAL | Status: DC | PRN
  Administered 2021-03-29 – 2021-04-02 (×6): 650 mg via ORAL
  Filled 2021-03-29 (×6): qty 2

## 2021-03-29 MED ORDER — IOHEXOL 9 MG/ML PO SOLN
ORAL | Status: AC
  Filled 2021-03-29: qty 1000

## 2021-03-29 MED ORDER — SODIUM CHLORIDE 0.9 % IV SOLN: INTRAVENOUS | Status: DC

## 2021-03-29 MED ORDER — POTASSIUM CHLORIDE CRYS ER 20 MEQ PO TBCR
40.0000 meq | EXTENDED_RELEASE_TABLET | Freq: Once | ORAL | Status: AC
  Administered 2021-03-29: 40 meq via ORAL
  Filled 2021-03-29: qty 2

## 2021-03-29 NOTE — ED Notes (Signed)
Patient called to get off bed pan at this time. Patient removed from bed pan. Patient soiled with feces. Patient cleaned and bed linens changed.

## 2021-03-29 NOTE — Progress Notes (Signed)
[facility] night shift.  The patient requested acetaminophen to the nursing staff for back pain.  Acetaminophen 650 mg p.o. every 6 hours as needed ordered.  Tennis Must, MD.

## 2021-03-29 NOTE — Progress Notes (Signed)
Patient Demographics:    [name], is a 55 y.o. female, DOB - 1966-06-09, JFH:[id number]  Admit date - 03/28/2021   Admitting Physician [name], MD  Outpatient Primary MD for the patient is Adamo, Jamie Perch, MD  LOS - 0   Chief Complaint  Patient presents with  . Diarrhea        Subjective:    [name] today has no fevers, no emesis,  No chest pain,   -Diarrhea persist,  Assessment  & Plan :    Principal Problem:   Diarrhea Active Problems:   COVID-19 virus infection   Hypokalemia due to excessive gastrointestinal loss of potassium   Hypomagnesemia   Atrial fibrillation with rapid ventricular response (HCC)   Diabetic peripheral neuropathy (HCC)   Chronic combined systolic (congestive) and diastolic (congestive) heart failure (HCC)   Coronary artery disease involving native coronary artery of native heart without angina pectoris   CKD stage 4 due to type 2 diabetes mellitus ([place])   Hyperlipidemia associated with type 2 diabetes mellitus ([place])  Brief Summary:- 55 y.o. female, with type 2 diabetes mellitus, lipidemia, stage IV CKD, hypertension, ischemic cardiomyopathy with CAD, GERD, history of non-STEMI, paroxysmal A. fib, bilateral amputee, with known history of persistent diarrhea readmitted with worsening diarrhea and electrolyte derangement  A/p 1)Diarrhea--chronic but worse lately GI consult appreciated--recommended  Fecal lactoferrin  Fecal pancreatic elastase.  Stool osmolality  24-hour stool collection for qualitative fecal fat analysis and electrolytes.  24-hour urine collection for 5-hydroxyindoleacetic acid  -He had EGD and colonoscopy -CT abdomen and pelvis noted with possible mild colitis  2) possible liver cirrhosis--- patient will follow up with GI service, currently not encephalopathic  3) severe hypokalemia/severe hypomagnesemia/hypocalcemia -due to  chronic diarrhea--- replace and recheck  4)Malabsorption  syndrome with electrolyte and vitamin deficiencies--- replace and recheck  5)PAfib -atrial fibrillation, continue metoprolol for rate control continue Xarelto for stroke prophylaxis amiodarone on hold given electrolyte derangement  6)Chronic combined systolic/diastolic CHF - Last 2D echocardiogram 11/2020 with LVEF 35-40% -No evidence of volume overload, will hold torsemide given her volume overload  7)Controlled type 2 diabetes -A1c is 5.7  - Continue with insulin sliding scale  8)CKD stageIIIb -renal function at baseline,  9)Peripheral vascular disease with bilateral above-knee amputations -Continue with statin  10)diabetic neuropathy -Continue with Lyrica, home amitriptyline once potassium is corrected  11)Tobacco abuse continue with nicotine patch  12)Covid 19 infection--- patient is vaccinated but not boosted Trend and follow inflammatory markers, continue IV remdesivir  Disposition/Need for in-Hospital Stay- patient unable to be discharged at this time due to -significant profuse frequent loose stools with significant life-threatening electrolyte abnormalities requiring IV replacements*  Status is: Inpatient  Remains inpatient appropriate because:Please see disposition above   Disposition: The patient is from: Home              Anticipated d/c is to: Home              Anticipated d/c date is: 2 days              Patient currently is not medically stable to d/c. Barriers: Not Clinically Stable-   Code Status : -  Code Status: Full Code   Family Communication:  NA (patient is alert, awake and coherent)   Consults  :  Gi  DVT Prophylaxis  :   - SCDs   Rivaroxaban (XARELTO) tablet 15 mg    Lab Results  Component Value Date   PLT 148 (L) 03/29/2021    Inpatient Medications  Scheduled Meds: . insulin aspart  0-15 Units Subcutaneous TID WC  . insulin aspart  0-5 Units Subcutaneous QHS  .  iohexol      . metoprolol tartrate  12.5 mg Oral BID  . nicotine  21 mg Transdermal Daily  . pantoprazole  40 mg Oral Daily  . pregabalin  100 mg Oral BID  . Rivaroxaban  15 mg Oral Q supper  . rosuvastatin  20 mg Oral q1800  . sodium bicarbonate  650 mg Oral BID  . cyanocobalamin  1,000 mcg Oral Daily   Continuous Infusions: . sodium chloride 150 mL/hr at 03/29/21 1041  . remdesivir 100 mg in NS 100 mL 100 mg (03/29/21 1506)   PRN Meds:.dicyclomine    Anti-infectives (From admission, onward)   Start     Dose/Rate Route Frequency Ordered Stop   03/29/21 1000  remdesivir 100 mg in sodium chloride 0.9 % 100 mL IVPB  Status:  Discontinued       "Followed by" Linked Group Details   100 mg 200 mL/hr over 30 Minutes Intravenous Daily 03/28/21 2132 03/28/21 2134   03/29/21 1000  remdesivir 100 mg in sodium chloride 0.9 % 100 mL IVPB        100 mg 200 mL/hr over 30 Minutes Intravenous Daily 03/28/21 2136 03/31/21 0959   03/28/21 2206  remdesivir 100 mg in sodium chloride 0.9 % 100 mL IVPB       "Followed by" Linked Group Details   100 mg 200 mL/hr over 30 Minutes Intravenous  Once 03/28/21 2137 03/29/21 0108   03/28/21 2145  remdesivir 100 mg in sodium chloride 0.9 % 100 mL IVPB       "Followed by" Linked Group Details   100 mg 200 mL/hr over 30 Minutes Intravenous  Once 03/28/21 2137 03/29/21 0107   03/28/21 2130  remdesivir 200 mg in sodium chloride 0.9% 250 mL IVPB  Status:  Discontinued       "Followed by" Linked Group Details   200 mg 580 mL/hr over 30 Minutes Intravenous Once 03/28/21 2132 03/28/21 2134        Objective:   Vitals:   03/29/21 0700 03/29/21 0800 03/29/21 1300 03/29/21 1548  BP: 102/73 100/75 110/72 129/68  Pulse: 68 66  64  Resp: 14 12 16 18   Temp:    98.1 F (36.7 C)  TempSrc:    Oral  SpO2: 98% 98%  99%  Weight:      Height:        Wt Readings from Last 3 Encounters:  03/28/21 74.8 kg  03/08/21 101.2 kg  12/05/20 101.2 kg      Intake/Output Summary (Last 24 hours) at 03/29/2021 1918 Last data filed at 03/29/2021 1506 Gross per 24 hour  Intake 244.19 ml  Output --  Net 244.19 ml     Physical Exam  Gen:- Awake Alert,  In .no apparent distress  HEENT:- [place].AT, No sclera icterus, legally blind Neck-Supple Neck,No JVD,.  Lungs-  CTAB , fair symmetrical air movement CV- S1, S2 normal, regular  Abd-  +ve B.Sounds, Abd Soft, No tenderness,    Extremity/Skin:- No  edema, pedal pulses present  Psych-affect is appropriate, oriented x3 Neuro-no  new focal deficits, no tremors MSK-bil  amputee   Data Review:   Micro Results Recent Results (from the past 240 hour(s))  Resp Panel by RT-PCR (Flu A&B, Covid) Nasopharyngeal Swab     Status: Abnormal   Collection Time: 03/28/21  7:22 PM   Specimen: Nasopharyngeal Swab; Nasopharyngeal(NP) swabs in vial transport medium  Result Value Ref Range Status   SARS Coronavirus 2 by RT PCR POSITIVE (A) NEGATIVE Final    Comment: RESULT CALLED TO, READ BACK BY AND VERIFIED WITH: WATLINGTON,K RN @2114  03/28/21 BILLINGSLEY,L (NOTE) SARS-CoV-2 target nucleic acids are DETECTED.  The SARS-CoV-2 RNA is generally detectable in upper respiratory specimens during the acute phase of infection. Positive results are indicative of the presence of the identified virus, but do not rule out bacterial infection or co-infection with other pathogens not detected by the test. Clinical correlation with patient history and other diagnostic information is necessary to determine patient infection status. The expected result is Negative.  Fact Sheet for Patients: EntrepreneurPulse.com.au  Fact Sheet for Healthcare Providers: IncredibleEmployment.be  This test is not yet approved or cleared by the [place] FDA and  has been authorized for detection and/or diagnosis of SARS-CoV-2 by FDA under an Emergency Use Authorization (EUA).  This EUA  will remain in effect (meaning this t est can be used) for the duration of  the COVID-19 declaration under Section 564(b)(1) of the Act, 21 U.S.C. section 360bbb-3(b)(1), unless the authorization is terminated or revoked sooner.     Influenza A by PCR NEGATIVE NEGATIVE Final   Influenza B by PCR NEGATIVE NEGATIVE Final    Comment: (NOTE) The Xpert Xpress SARS-CoV-2/FLU/RSV plus assay is intended as an aid in the diagnosis of influenza from Nasopharyngeal swab specimens and should not be used as a sole basis for treatment. Nasal washings and aspirates are unacceptable for Xpert Xpress SARS-CoV-2/FLU/RSV testing.  Fact Sheet for Patients: EntrepreneurPulse.com.au  Fact Sheet for Healthcare Providers: IncredibleEmployment.be  This test is not yet approved or cleared by the [place] FDA and has been authorized for detection and/or diagnosis of SARS-CoV-2 by FDA under an Emergency Use Authorization (EUA). This EUA will remain in effect (meaning this test can be used) for the duration of the COVID-19 declaration under Section 564(b)(1) of the Act, 21 U.S.C. section 360bbb-3(b)(1), unless the authorization is terminated or revoked.  Performed at [facility], [name] [place], [place] [address]   C Difficile Quick Screen w PCR reflex     Status: None   Collection Time: 03/29/21  8:46 AM   Specimen: STOOL  Result Value Ref Range Status   C Diff antigen NEGATIVE NEGATIVE Final   C Diff toxin NEGATIVE NEGATIVE Final   C Diff interpretation No C. difficile detected.  Final    Comment: NEGATIVE Performed at [facility], [address] [place], [place] [address]     Radiology Reports CT ABDOMEN PELVIS W CONTRAST  Result Date: 03/29/2021 CLINICAL DATA:  Acute abdominal pain with continued diarrhea and occasional vomiting. EXAM: CT ABDOMEN AND PELVIS WITH CONTRAST TECHNIQUE: Multidetector CT imaging of the abdomen and pelvis was performed  using the standard protocol following bolus administration of intravenous contrast. CONTRAST:  165mL OMNIPAQUE IOHEXOL 300 MG/ML  SOLN COMPARISON:  Most recent CT 11/30/2017 FINDINGS: Lower chest: Small bilateral pleural effusions, present on prior exam, minimally increased. Left basilar opacity is similar, likely scarring/chronic atelectasis. Heart is enlarged. Coronary artery calcifications. Small pericardial effusion. Hepatobiliary: Diffusely heterogeneous appearance of the liver. Slight nodular  hepatic contours suggest cirrhosis. No obvious focal hepatic lesion allowing for phase of contrast. Post cholecystectomy. There is no biliary dilatation. Small volume perihepatic ascites. Pancreas: No ductal dilatation or inflammation. Spleen: Multiple rounded low-density lesions scattered throughout the spleen with lobulated borders, largest measuring 18 mm in the upper and lower spleen. Heterogeneous splenic enhancement likely related to phase of contrast. No splenomegaly. Adrenals/Urinary Tract: 11 mm left adrenal nodule. Normal right adrenal gland. No hydronephrosis. Bilateral perinephric edema, right greater than left. Mild thinning of bilateral renal parenchyma. No visualized renal or ureteral calculi. No focal renal abnormality. Urinary bladder is partially distended, thick walled. Stomach/Bowel: Unremarkable stomach. No small bowel obstruction. No evidence of small bowel inflammation, mild small bowel thickening felt to be reactive secondary to ascites. Normal appendix. There is liquid stool in the ascending and proximal transverse colon. Equivocal wall thickening of the descending colon. No bowel pneumatosis. 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 Bifem bypass graft appears patent. Stent in the right common femoral artery which is patent. The left common femoral artery appears occluded. Patent portal vein. Patent mesenteric vasculature. No  portal venous or mesenteric gas. No enlarged lymph nodes in the abdomen or pelvis. Reproductive: Normal for age uterine atrophy.  No adnexal mass. Other: Small to moderate volume abdominopelvic ascites. There is prominent subcutaneous body wall edema. No free air or focal fluid collection. Postsurgical change of the anterior abdominal wall. Musculoskeletal: Postsurgical change in the lower lumbar spine bones appear diffusely under mineralized. Probable bone graft donor site right iliac crest. IMPRESSION: 1. Liquid stool in the ascending and proximal transverse colon with equivocal wall thickening of the descending colon. Findings likely represent diarrheal illness in possible mild colitis of the descending colon. 2. Heterogeneous appearance of the liver with nodular hepatic contours, suggesting cirrhosis. 3. Small to moderate volume abdominopelvic ascites. Prominent subcutaneous body wall edema. Findings suggest third-spacing. 4. Bilateral perinephric edema, right greater than left, as well as bladder wall thickening. This may be due to underlying chronic renal disease, however possibility of urinary tract infection is raised. Recommend correlation with urinalysis. 5. Multiple rounded low-density lesions scattered throughout the spleen, largest measuring 18 mm in the upper and lower spleen. These are nonspecific and may be cysts or hemangiomas. 6. Small bilateral pleural effusions, minimally increased from prior exam. 7. Indeterminate 11 mm left adrenal nodule. Aortic Atherosclerosis (ICD10-I70.0). Electronically Signed   By: Keith Rake M.D.   On: 03/29/2021 15:47   US RENAL  Result Date: 03/16/2021 CLINICAL DATA:  Acute kidney injury, abnormal labs today EXAM: RENAL / URINARY TRACT ULTRASOUND COMPLETE COMPARISON:  Renal ultrasound 10/10/2020 FINDINGS: Right Kidney: Renal measurements: 13.2 x 5.7 x 5.4 cm = volume: 213.6 mL. Echogenicity within normal limits. No mass or hydronephrosis visualized. Left  Kidney: Renal measurements: 11.7 x 6.7 x 5.1 cm = volume: 209.1 mL. Limited visualization, however visualized portions demonstrate normal echogenicity without hydronephrosis. Bladder: Appears normal for degree of bladder distention. Bilateral ureteral jets are seen. Other: None. IMPRESSION: No hydronephrosis. Electronically Signed   By: Maurine Simmering   On: 03/16/2021 08:45     CBC Recent Labs  Lab 03/28/21 1420 03/29/21 0350  WBC 5.7 5.3  HGB 9.4* 9.2*  HCT 30.3* 30.6*  PLT 172 148*  MCV 84.9 86.0  MCH 26.3 25.8*  MCHC 31.0 30.1  RDW 20.5* 20.4*  LYMPHSABS 0.5*  --   MONOABS 0.3  --   EOSABS 0.1  --  BASOSABS 0.0  --     Chemistries  Recent Labs  Lab 03/28/21 1420 03/28/21 1700 03/29/21 0350  NA 137  --  136  K 2.7*  --  3.6  CL 107  --  109  CO2 24  --  22  GLUCOSE 85  --  94  BUN 7  --  6  CREATININE 1.23*  --  1.24*  CALCIUM 6.6*  --  6.6*  MG  --  1.0* 1.8  AST 11*  --  10*  ALT 11  --  10  ALKPHOS 92  --  91  BILITOT 0.5  --  0.4   ------------------------------------------------------------------------------------------------------------------ No results for input(s): CHOL, HDL, LDLCALC, TRIG, CHOLHDL, LDLDIRECT in the last 72 hours.  Lab Results  Component Value Date   HGBA1C 5.6 03/09/2021   ------------------------------------------------------------------------------------------------------------------ No results for input(s): TSH, T4TOTAL, T3FREE, THYROIDAB in the last 72 hours.  Invalid input(s): FREET3 ------------------------------------------------------------------------------------------------------------------ No results for input(s): VITAMINB12, FOLATE, FERRITIN, TIBC, IRON, RETICCTPCT in the last 72 hours.  Coagulation profile No results for input(s): INR, PROTIME in the last 168 hours.  No results for input(s): DDIMER in the last 72 hours.  Cardiac Enzymes No results for input(s): CKMB, TROPONINI, MYOGLOBIN in the last 168  hours.  Invalid input(s): CK ------------------------------------------------------------------------------------------------------------------    Component Value Date/Time   BNP 2,905.0 (H) 12/01/2020 8182     Roxan Hockey M.D on 03/29/2021 at 7:18 PM  Go to [email or web address] - for contact info  [name] - Office  [phone]

## 2021-03-29 NOTE — Progress Notes (Signed)
24 hour urine and stool collection started at this time per day shift nurse (1900 start).

## 2021-03-29 NOTE — Consult Note (Signed)
Referring Provider: Roxan Hockey, MD Primary Care Physician:  Frazier Richards, MD Primary Gastroenterologist:  Elon Alas. Abbey Chatters, DO  Reason for Consultation: Diarrhea  HPI: [name] is a 55 y.o. female past medical history significant for blindness, A. fib on Xarelto, systolic and diastolic CHF, PAD, status post bilateral above-the-knee amputation, CAD, GERD, diabetes, chronic kidney disease, well-known from recent admission for nausea/vomiting/diarrhea presenting back to the ED with complaints of persistent diarrhea and occasional vomiting.  Recent admission for acute on chronic diarrhea with severe electrolyte abnormalities.  Reports diarrhea persistent for years but worse the last 2 months.  Symptoms associated with chronic left lower quadrant pain.  Typically avoids dairy products.  Recent admission with extensive evaluation.  C. difficile was negative.  GI pathogen panel negative.  TSH normal.  Celiac serologies negative.  Duodenal biopsies negative as well.  EGD and colonoscopy findings as outlined below.   Patient presents back complaining of too numerous to count episodes of diarrhea.  Reports going through a full 25 count pack of depends since yesterday.  Having 10-15+ stools per day.  Stools are watery.  She states there is been no evidence of blood.  Says that she cannot see and her husband does not always assist her but there is been no evidence of blood reported.  States she has been taking up to 8 Imodium per day but took none yesterday because she ran out.  She is not sure if she is taking dicyclomine.  States her husband dispenses all of her medications.  Denies fever or chills.  Persistent nausea with occasional vomiting, 1-2 episodes of vomiting every other day on average.  Home health comes in twice weekly and visited day of admission, PCP advised of severe diarrhea and requested coming in for reevaluation of her symptoms.  In the ED her potassium was 2.7, calcium 6.6,  albumin improved at 2.1, creatinine improved at 1.23.  White blood cell count 5700, hemoglobin stable at 9.4, hematocrit 30.3, MCV 84.9, platelets 172,000.  COVID screen came back positive.  CRP 1.3.  LFTs normal, lipase normal.   EGD March 13, 2021: -Normal esophagus. -Normal stomach. -Mucosal nodule found in the duodenum. Biopsied.  Unremarkable duodenal mucosa, no features of celiac.  No granulomas.  Colonoscopy March 13, 2021: -Presence of perianal scars and erythema in perianal area found on perianal exam. -The examined portion of the ileum was normal. Biopsied. -Five 3 to 12 mm polyps in the ascending colon, removed with a cold snare. Resected and retrieved. -Four 4 to 8 mm polyps in the descending colon and in the transverse colon, removed with a cold snare. Resected and retrieved. -The rest of the examined colon is normal. Biopsied. -The distal rectum and anal verge are normal on retroflexion view. -Colon polyps were tubular adenomas, random colon biopsies were negative. -Next colonoscopy in 6 months due to piecemeal removal of polyp.  Prior to Admission medications   Medication Sig Start Date End Date Taking? Authorizing Provider  albuterol (PROVENTIL) 2 MG tablet Take 1 tablet (2 mg total) by mouth daily as needed for shortness of breath. 12/05/20   Gerlene Fee, NP  amiodarone (PACERONE) 200 MG tablet Take 1 tablet (200 mg total) by mouth See admin instructions. oral amiodarone 400mg  bid x 7 days, then 200mg  bid x 2 weeks, then 200mg  daily after that 12/05/20   Gerlene Fee, NP  amitriptyline (ELAVIL) 25 MG tablet Take 1 tablet (25 mg total) by mouth at bedtime. 12/05/20  Gerlene Fee, NP  Balsam [place]-Castor Oil [facility]) OINT Apply 1 application topically 3 (three) times daily. Every shift to bilateral buttocks, coccyx, and sacrum    [provider]  Choline Fenofibrate (FENOFIBRIC ACID) 45 MG CPDR Take 1 capsule by mouth daily. 01/03/21   [provider]   dicyclomine (BENTYL) 10 MG capsule Take 1 capsule (10 mg total) by mouth every 6 (six) hours as needed for spasms. 12/05/20   Gerlene Fee, NP  insulin lispro (HUMALOG KWIKPEN) 100 UNIT/ML KwikPen Inject 4-10 Units into the skin 3 (three) times daily with meals. If blood sugar 121-150, 2 units If blood sugar 151-200, 4 units If blood sugar 201-250, 6 units If blood sugar 251-300, 8 units If blood sugar 301-500, 10 units If blood sugar greater than 350 call MD 12/05/20   Gerlene Fee, NP  loperamide (IMODIUM) 2 MG capsule Take 2 capsules (4 mg total) by mouth 2 (two) times daily. 03/16/21   Kathie Dike, MD  metoprolol tartrate (LOPRESSOR) 25 MG tablet Take 0.5 tablets (12.5 mg total) by mouth 2 (two) times daily. 12/05/20   Gerlene Fee, NP  naloxone [facility]) nasal spray 4 mg/0.1 mL Place 1 spray into the nose once as needed (opioid overdose). 12/05/20   Gerlene Fee, NP  nicotine (NICODERM CQ - DOSED IN MG/24 HOURS) 21 mg/24hr patch Place 21 mg onto the skin daily. Rotate sites and remove old patch prior to application    [provider]  omeprazole (PRILOSEC) 40 MG capsule Take 1 capsule (40 mg total) by mouth daily. 12/05/20   Gerlene Fee, NP  Potassium Chloride ER 20 MEQ TBCR Take 20 mEq by mouth daily. 1 tab daily by mouth 12/05/20   Gerlene Fee, NP  pregabalin (LYRICA) 100 MG capsule Take 1 capsule (100 mg total) by mouth 2 (two) times daily. 03/16/21   Kathie Dike, MD  Rivaroxaban (XARELTO) 15 MG TABS tablet Take 1 tablet (15 mg total) by mouth daily with supper. 12/05/20   Gerlene Fee, NP  rosuvastatin (CRESTOR) 20 MG tablet Take 1 tablet (20 mg total) by mouth daily at 6 PM. 12/05/20   Nyoka Cowden, Phylis Bougie, NP  sodium bicarbonate 650 MG tablet Take 1 tablet (650 mg total) by mouth 2 (two) times daily. 12/05/20   Gerlene Fee, NP  torsemide (DEMADEX) 20 MG tablet Take 1 tablet (20 mg total) by mouth daily. 12/05/20   Gerlene Fee, NP  vitamin B-12 1000  MCG tablet Take 1 tablet (1,000 mcg total) by mouth daily. 10/12/20   Pahwani, Michell Heinrich, MD    Current Facility-Administered Medications  Medication Dose Route Frequency Provider Last Rate Last Admin  . dicyclomine (BENTYL) capsule 10 mg  10 mg Oral Q6H PRN Elgergawy, Silver Huguenin, MD      . insulin aspart (novoLOG) injection 0-15 Units  0-15 Units Subcutaneous TID WC Elgergawy, Silver Huguenin, MD      . insulin aspart (novoLOG) injection 0-5 Units  0-5 Units Subcutaneous QHS Elgergawy, Dawood S, MD      . metoprolol tartrate (LOPRESSOR) tablet 12.5 mg  12.5 mg Oral BID Elgergawy, Silver Huguenin, MD      . nicotine (NICODERM CQ - dosed in mg/24 hours) patch 21 mg  21 mg Transdermal Daily Elgergawy, Dawood S, MD      . pantoprazole (PROTONIX) EC tablet 40 mg  40 mg Oral Daily Elgergawy, Silver Huguenin, MD      . pregabalin (LYRICA)  capsule 100 mg  100 mg Oral BID Elgergawy, Silver Huguenin, MD      . remdesivir 100 mg in sodium chloride 0.9 % 100 mL IVPB  100 mg Intravenous Daily Elgergawy, Silver Huguenin, MD      . Rivaroxaban (XARELTO) tablet 15 mg  15 mg Oral Q supper Elgergawy, Silver Huguenin, MD      . rosuvastatin (CRESTOR) tablet 20 mg  20 mg Oral q1800 Elgergawy, Silver Huguenin, MD      . sodium bicarbonate tablet 650 mg  650 mg Oral BID Elgergawy, Silver Huguenin, MD      . vitamin B-12 (CYANOCOBALAMIN) tablet 1,000 mcg  1,000 mcg Oral Daily Elgergawy, Silver Huguenin, MD       Current Outpatient Medications  Medication Sig Dispense Refill  . albuterol (PROVENTIL) 2 MG tablet Take 1 tablet (2 mg total) by mouth daily as needed for shortness of breath. 30 tablet 0  . amiodarone (PACERONE) 200 MG tablet Take 1 tablet (200 mg total) by mouth See admin instructions. oral amiodarone 400mg  bid x 7 days, then 200mg  bid x 2 weeks, then 200mg  daily after that 90 tablet 0  . amitriptyline (ELAVIL) 25 MG tablet Take 1 tablet (25 mg total) by mouth at bedtime. 30 tablet 0  . Balsam [place]-Castor Oil (VENELEX) OINT Apply 1 application topically 3 (three) times  daily. Every shift to bilateral buttocks, coccyx, and sacrum    . Choline Fenofibrate (FENOFIBRIC ACID) 45 MG CPDR Take 1 capsule by mouth daily.    Marland Kitchen dicyclomine (BENTYL) 10 MG capsule Take 1 capsule (10 mg total) by mouth every 6 (six) hours as needed for spasms. 30 capsule 0  . insulin lispro (HUMALOG KWIKPEN) 100 UNIT/ML KwikPen Inject 4-10 Units into the skin 3 (three) times daily with meals. If blood sugar 121-150, 2 units If blood sugar 151-200, 4 units If blood sugar 201-250, 6 units If blood sugar 251-300, 8 units If blood sugar 301-500, 10 units If blood sugar greater than 350 call MD 15 mL 0  . loperamide (IMODIUM) 2 MG capsule Take 2 capsules (4 mg total) by mouth 2 (two) times daily. 60 capsule 0  . metoprolol tartrate (LOPRESSOR) 25 MG tablet Take 0.5 tablets (12.5 mg total) by mouth 2 (two) times daily. 30 tablet 0  . naloxone (NARCAN) nasal spray 4 mg/0.1 mL Place 1 spray into the nose once as needed (opioid overdose). 1 each 0  . nicotine (NICODERM CQ - DOSED IN MG/24 HOURS) 21 mg/24hr patch Place 21 mg onto the skin daily. Rotate sites and remove old patch prior to application    . omeprazole (PRILOSEC) 40 MG capsule Take 1 capsule (40 mg total) by mouth daily. 30 capsule 0  . Potassium Chloride ER 20 MEQ TBCR Take 20 mEq by mouth daily. 1 tab daily by mouth 30 tablet 0  . pregabalin (LYRICA) 100 MG capsule Take 1 capsule (100 mg total) by mouth 2 (two) times daily. 60 capsule 0  . Rivaroxaban (XARELTO) 15 MG TABS tablet Take 1 tablet (15 mg total) by mouth daily with supper. 30 tablet 0  . rosuvastatin (CRESTOR) 20 MG tablet Take 1 tablet (20 mg total) by mouth daily at 6 PM. 30 tablet 0  . sodium bicarbonate 650 MG tablet Take 1 tablet (650 mg total) by mouth 2 (two) times daily. 60 tablet 0  . torsemide (DEMADEX) 20 MG tablet Take 1 tablet (20 mg total) by mouth daily. 30 tablet 0  . vitamin B-12  1000 MCG tablet Take 1 tablet (1,000 mcg total) by mouth daily. 30 tablet 0     Allergies as of 03/28/2021 - Review Complete 03/28/2021  Allergen Reaction Noted  . Lactose intolerance (gi) Diarrhea 05/04/2018    Past Medical History:  Diagnosis Date  . Abnormal stress test    a. 02/2017 MV: large region of fixed perfusion defect in basal to mid inf, mid-dist inflat walls, EF 43%. No ischemia (EF 55-60% by f/u echo).  . Arthritis   . Asthma   . Carotid arterial disease ([place])    a. 09/2017 Carotid U/S: 40-49% bilat ICA stenosis.  . Chronic back pain   . Coronary artery calcification seen on CT scan    a. 11/2017 CT Abd/Pelvis: Multi vessel coronary vascular Ca2+.  . Depression   . Diabetes mellitus   . Diabetic neuropathy ([place])   . Difficult intubation    DIFFICULT AIRWAY/FYI  . Family history of adverse reaction to anesthesia    mother had difficlty waking   . Femoral-popliteal bypass graft occlusion, left ([place]) 12/02/2017  . GERD (gastroesophageal reflux disease)   . History of echocardiogram    a. 03/2017 Echo: EF 55-60%, mild LVH, nl RV fxn.  . Hyperlipidemia   . Ischemic cardiomyopathy    a. 04/2018 Echo: EF 30-35%, Gr2 DD, mild LVH. Mild MR. Mildly dil LA, mod dil RV w/ mod red RV fxn, mild TR, PASP 30mmHg.  . NSTEMI (non-ST elevated myocardial infarction) ([place])    a. 05/2018 in setting of Afib, sepsis, and post-op L AKA. Peak trop 7.7. EF 30-35% by echo-->cath not performed 2/2 renal failure.  . 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 PAD (peripheral artery disease) ([place])    a. S/p L fem-pop bypass; b. 11/2017 s/p Aortobifem bypass 2/2 graft occlusion; c. 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  . Paroxysmal atrial fibrillation with rapid ventricular response ([place]) 12/02/2017   a. CHA2DS2VASc = 3-->Xarelto; b. 05/2018 Recurrent Afib-->amio.  Marland Kitchen Renal insufficiency   . Ulcer    Foot    Past Surgical History:  Procedure Laterality Date  . ABDOMINAL AORTAGRAM  June 15, 2014  . ABDOMINAL AORTAGRAM N/A 06/15/2014    Procedure: ABDOMINAL Maxcine Ham;  Surgeon: Serafina Mitchell, MD;  Location: [facility] CATH LAB;  Service: Cardiovascular;  Laterality: N/A;  . ABDOMINAL AORTAGRAM N/A 11/22/2014   Procedure: ABDOMINAL AORTAGRAM;  Surgeon: Serafina Mitchell, MD;  Location: [facility] CATH LAB;  Service: Cardiovascular;  Laterality: N/A;  . ABDOMINAL AORTOGRAM W/LOWER EXTREMITY N/A 01/07/2017   Procedure: Abdominal Aortogram w/Lower Extremity;  Surgeon: Serafina Mitchell, MD;  Location: [place] CV LAB;  Service: Cardiovascular;  Laterality: N/A;  . ABDOMINAL AORTOGRAM W/LOWER EXTREMITY N/A 10/31/2017   Procedure: ABDOMINAL AORTOGRAM W/LOWER EXTREMITY;  Surgeon: Elam Dutch, MD;  Location: [place] CV LAB;  Service: Cardiovascular;  Laterality: N/A;  . ABDOMINAL AORTOGRAM W/LOWER EXTREMITY N/A 03/24/2018   Procedure: ABDOMINAL AORTOGRAM W/LOWER EXTREMITY;  Surgeon: Serafina Mitchell, MD;  Location: [place] CV LAB;  Service: Cardiovascular;  Laterality: N/A;  . AMPUTATION Left 04/26/2018   Procedure: AMPUTATION ABOVE KNEE;  Surgeon: Elam Dutch, MD;  Location: [place];  Service: Vascular;  Laterality: Left;  . AORTA - BILATERAL FEMORAL ARTERY BYPASS GRAFT N/A 11/28/2017   Procedure: AORTA BIFEMORAL BYPASS USING HEMASHIELD GOLD GRAFT & REIMPLANT IMA;  Surgeon: Serafina Mitchell, MD;  Location: [facility] OR;  Service: Vascular;  Laterality: N/A;  . AORTIC  ARCH ANGIOGRAPHY N/A 10/31/2017   Procedure: AORTIC ARCH ANGIOGRAPHY;  Surgeon: Elam Dutch, MD;  Location: [place] CV LAB;  Service: Cardiovascular;  Laterality: N/A;  . APPLICATION OF WOUND VAC  11/28/2017   Procedure: APPLICATION OF WOUND VAC;  Surgeon: Serafina Mitchell, MD;  Location: [facility] OR;  Service: Vascular;;  . APPLICATION OF WOUND VAC Left 03/27/2018   Procedure: APPLICATION OF WOUND VAC LEFT GROIN USING PREVENA PLUS;  Surgeon: Serafina Mitchell, MD;  Location: [place];  Service: Vascular;  Laterality: Left;  . ARTERIAL BYPASS SURGERY   07/05/2010   Right Common  Femoral to below knee popliteal BPG  . BACK SURGERY     X's  2  . BIOPSY  03/13/2021   Procedure: BIOPSY;  Surgeon: Harvel Quale, MD;  Location: [facility];  Service: Gastroenterology;;  . CARDIAC CATHETERIZATION    . CHOLECYSTECTOMY     Gall Bladder  . COLONOSCOPY WITH PROPOFOL N/A 03/13/2021   Procedure: COLONOSCOPY WITH PROPOFOL;  Surgeon: Harvel Quale, MD;  [facility]: [facility];  Service: Gastroenterology;  Laterality: N/A;  with random colonic biopsies  . CYSTECTOMY Left    wrist  . EMBOLECTOMY Left 11/28/2017   Procedure: Left Lower Extremity Embolectomy, Left Tibial Peroneal Trunk Endarterectomy with Patch Angioplasty; Vein Harvest Small Saphenous Graft Left Lower Leg;  Surgeon: [name], MD;  Location: [place];  Service: Vascular;  Laterality: Left;  . ESOPHAGOGASTRODUODENOSCOPY (EGD) WITH PROPOFOL N/A 03/13/2021   Procedure: ESOPHAGOGASTRODUODENOSCOPY (EGD) WITH PROPOFOL;  Surgeon: Harvel Quale, MD;  [facility];  Service: Gastroenterology;  Laterality: N/A;  . EYE SURGERY Left 02/23/2020   Dr. Zadie Rhine  . FEMORAL-POPLITEAL BYPASS GRAFT Left 03/27/2018   Procedure: THROMBECTOMY OF LEFT FEMORAL TIBIAL BYPASS;  Surgeon: Serafina Mitchell, MD;  Location: [place];  Service: Vascular;  Laterality: Left;  . FEMORAL-TIBIAL BYPASS GRAFT Left 03/27/2018   Procedure: BYPASS GRAFT FEMORAL-TIBIAL ARTERY LEFT REDO USING CRYOPRESERVED SAPHENOUS VEIN 70cm;  Surgeon: Serafina Mitchell, MD;  Location: [facility] OR;  Service: Vascular;  Laterality: Left;  . INTERCOSTAL NERVE BLOCK  November 2015  . INTRAOPERATIVE ARTERIOGRAM  11/28/2017   Procedure: INTRA OPERATIVE ARTERIOGRAM OF LEFT LEG;  Surgeon: Serafina Mitchell, MD;  Location: [place];  Service: Vascular;;  . INTRAOPERATIVE ARTERIOGRAM Left 03/27/2018   Procedure: INTRA OPERATIVE ARTERIOGRAM TIMES TWO;  Surgeon: Serafina Mitchell, MD;  Location: [place];  Service: Vascular;  Laterality: Left;  . IR  FLUORO GUIDE CV LINE RIGHT  03/20/2017  . IR FLUORO GUIDE CV LINE RIGHT  05/05/2018  . IR REMOVAL TUN CV CATH W/O FL  05/20/2018  . IR US GUIDE VASC ACCESS RIGHT  03/20/2017  . IR US GUIDE VASC ACCESS RIGHT  05/05/2018  . IRRIGATION AND DEBRIDEMENT BUTTOCKS Right 09/30/2016   Procedure: DEBRIDEMENT RIGHT  BUTTOCK WOUND;  Surgeon: Georganna Skeans, MD;  Location: [place];  Service: General;  Laterality: Right;  . left foot surgery    . PERIPHERAL VASCULAR CATHETERIZATION N/A 05/07/2016   Procedure: Abdominal Aortogram;  Surgeon: Serafina Mitchell, MD;  Location: [place] CV LAB;  Service: Cardiovascular;  Laterality: N/A;  . PERIPHERAL VASCULAR CATHETERIZATION N/A 05/07/2016   Procedure: Lower Extremity Angiography;  Surgeon: Serafina Mitchell, MD;  Location: [place] CV LAB;  Service: Cardiovascular;  Laterality: N/A;  . PERIPHERAL VASCULAR CATHETERIZATION N/A 05/07/2016   Procedure: Aortic Arch Angiography;  Surgeon: Serafina Mitchell, MD;  Location: [place] CV LAB;  Service: Cardiovascular;  Laterality: N/A;  . PERIPHERAL VASCULAR CATHETERIZATION N/A 05/07/2016   Procedure: Upper Extremity Angiography;  Surgeon: Serafina Mitchell, MD;  Location: [place] CV LAB;  Service: Cardiovascular;  Laterality: N/A;  . PERIPHERAL VASCULAR CATHETERIZATION Right 05/07/2016   Procedure: Peripheral Vascular Balloon Angioplasty;  Surgeon: Serafina Mitchell, MD;  Location: [place] CV LAB;  Service: Cardiovascular;  Laterality: Right;  subclavian  . PERIPHERAL VASCULAR CATHETERIZATION Right 05/07/2016   Procedure: Peripheral Vascular Intervention;  Surgeon: Serafina Mitchell, MD;  Location: [place] CV LAB;  Service: Cardiovascular;  Laterality: Right;  External  Iliac  . POLYPECTOMY  03/13/2021   Procedure: POLYPECTOMY;  Surgeon: Harvel Quale, MD;  [facility];  Service: Gastroenterology;;  . SKIN GRAFT Right 2012   [name] by Dr. Nils Pyle- Right and Left Ankle  . SPINE SURGERY    . THROMBECTOMY  FEMORAL ARTERY  11/28/2017   Procedure: THROMBECTOMY  & REVISION OF BILATERAL FEMORAL TO POPLETEAL ARTERIES;  Surgeon: Serafina Mitchell, MD;  Location: [place];  Service: Vascular;;  . TONSILLECTOMY    . TRANSMETATARSAL AMPUTATION Right 10/07/2020   Procedure: RIGHT BELOW KNEE AMPUTATION;  Surgeon: Erle Crocker, MD;  Location: [place];  Service: Orthopedics;  Laterality: Right;    Family History  Problem Relation Age of Onset  . Coronary artery disease Mother   . Peripheral vascular disease Mother   . Heart disease Mother        Before age 27  . Other Mother        Venous insuffiency  . Diabetes Mother   . Hyperlipidemia Mother   . Hypertension Mother   . Varicose Veins Mother   . Heart attack Mother        before age 15  . Heart disease Father   . Diabetes Father   . Diabetes Maternal Grandmother   . Diabetes Paternal Grandmother   . Diabetes Paternal Grandfather   . Diabetes Sister   . Hypertension Sister   . Diabetes Brother   . Hypertension Brother     Social History   Socioeconomic History  . Marital status: Married    Spouse name: Not on file  . Number of children: Not on file  . Years of education: Not on file  . Highest education level: Not on file  Occupational History  . Occupation: disabled  Tobacco Use  . Smoking status: Current Every Day Smoker    Packs/day: 0.50    Years: 30.00    Pack years: 15.00    Types: Cigarettes  . Smokeless tobacco: Never Used  Vaping Use  . Vaping Use: Never used  Substance and Sexual Activity  . Alcohol use: No    Alcohol/week: 0.0 standard drinks  . Drug use: No  . Sexual activity: Yes    Birth control/protection: Post-menopausal  Other Topics Concern  . Not on file  Social History Narrative  . Not on file   Social Determinants of Health   Financial Resource Strain: Not on file  Food Insecurity: Not on file  Transportation Needs: Not on file  Physical Activity: Not on file  Stress: Not on file  Social  Connections: Not on file  Intimate Partner Violence: Not on file     ROS:  General: Negative for anorexia, weight loss, fever, chills, fatigue,+ weakness. Eyes: Negative for vision changes.  ENT: Negative for hoarseness, difficulty swallowing , +nasal congestion. CV: Negative for chest pain, angina, palpitations, dyspnea on exertion, peripheral  edema.  Respiratory: Negative for dyspnea at rest, dyspnea on exertion, cough, sputum, wheezing.  GI: See history of present illness. GU:  Negative for dysuria, hematuria, urinary incontinence, urinary frequency, nocturnal urination.  MS: Negative for joint pain, low back pain.  Derm: Negative for rash or itching.  Neuro: Negative for weakness, abnormal sensation, seizure, frequent headaches, memory loss, confusion.  Psych: Negative for anxiety, depression, suicidal ideation, hallucinations.  Endo: Negative for unusual weight change.  Heme: Negative for bruising or bleeding. Allergy: Negative for rash or hives.       Physical Examination: Vital signs in last 24 hours: Temp:  [98.1 F (36.7 C)] 98.1 F (36.7 C) (05/11 1203) Pulse Rate:  [60-79] 68 (05/12 0700) Resp:  [11-18] 14 (05/12 0700) BP: (90-147)/(61-92) 102/73 (05/12 0700) SpO2:  [95 %-99 %] 98 % (05/12 0700) Weight:  [74.8 kg] 74.8 kg (05/11 1203)    General: chronically ill appearing female in nad.  Head: Normocephalic, atraumatic.   Eyes: Conjunctiva pink, no icterus. Mouth: masked Neck: Supple without thyromegaly, masses, or lymphadenopathy.  Lungs: Clear to auscultation bilaterally.  Heart: Regular rate and rhythm, no murmurs rubs or gallops.  Abdomen: Bowel sounds are normal, nondistended, no hepatosplenomegaly or masses, no abdominal bruits or hernia , no rebound or guarding.  Mild LLQ tenderness. Some mild pitting edema in flanks. Rectal: not performed Extremities: bilateral lower extremity amputations. No edema in the thighs.   Neuro: Alert and oriented x 4 , grossly  normal neurologically.  Skin: Warm and dry, no rash or jaundice.   Psych: Alert and cooperative, normal mood and affect.        Intake/Output from previous day: No intake/output data recorded. Intake/Output this shift: No intake/output data recorded.  Lab Results: CBC Recent Labs    03/28/21 1420 03/29/21 0350  WBC 5.7 5.3  HGB 9.4* 9.2*  HCT 30.3* 30.6*  MCV 84.9 86.0  PLT 172 148*   BMET Recent Labs    03/28/21 1420 03/29/21 0350  NA 137 136  K 2.7* 3.6  CL 107 109  CO2 24 22  GLUCOSE 85 94  BUN 7 6  CREATININE 1.23* 1.24*  CALCIUM 6.6* 6.6*   LFT Recent Labs    03/28/21 1420 03/29/21 0350  BILITOT 0.5 0.4  ALKPHOS 92 91  AST 11* 10*  ALT 11 10  PROT 5.3* 5.3*  ALBUMIN 2.1* 2.0*    Lipase Recent Labs    03/28/21 1420  LIPASE 19   Lab Results  Component Value Date   FOLATE 7.1 10/10/2020   Lab Results  Component Value Date   VITAMINB12 210 03/15/2021   Lab Results  Component Value Date   IRON 35 03/12/2021   TIBC 188 (L) 03/12/2021   FERRITIN 30 03/12/2021   Lab Results  Component Value Date   TSH 2.889 03/11/2021    PT/INR No results for input(s): LABPROT, INR in the last 72 hours.    Imaging Studies: US RENAL  Result Date: 03/16/2021 CLINICAL DATA:  Acute kidney injury, abnormal labs today EXAM: RENAL / URINARY TRACT ULTRASOUND COMPLETE COMPARISON:  Renal ultrasound 10/10/2020 FINDINGS: Right Kidney: Renal measurements: 13.2 x 5.7 x 5.4 cm = volume: 213.6 mL. Echogenicity within normal limits. No mass or hydronephrosis visualized. Left Kidney: Renal measurements: 11.7 x 6.7 x 5.1 cm = volume: 209.1 mL. Limited visualization, however visualized portions demonstrate normal echogenicity without hydronephrosis. Bladder: Appears normal for degree of bladder distention. Bilateral ureteral jets are seen. Other: None. IMPRESSION: No hydronephrosis.  Electronically Signed   By: Maurine Simmering   On: 03/16/2021 08:45  [4  week]   Impression: Pleasant 55 year old female with multiple comorbidities, recent admission for severe electrolyte abnormalities in the setting of chronic diarrhea and vomiting presenting back to the emergency department yesterday with persistent symptoms.  Potassium 2.7 on presentation.  Tested positive for COVID, reporting chronic sinus symptoms.  Chronic diarrhea: Recent work-up as outlined above.  Colonoscopy, EGD unrevealing with regards to etiology for diarrhea or vomiting.  Stool studies recently negative (GI pathogen panel and Cdiff). Celiac work-up negative.  Etiology unclear, cannot exclude bile salt diarrhea component, IBS overlay.  She had lactose intolerance and has some dairy products at times.  Reported terminal ileum biopsies at time of colonoscopy but I do not see any pathology supporting this.  Random colon biopsies negative for microscopic colitis and endoscopically her TI appeared normal. Covid may be exacerbating symptoms.  Abdominal pain: Chronic left lower quadrant pain associated with diarrhea.  No prior imaging.  Colonoscopy without explanation.  Discussed with Dr. Denton Brick, has ordered CT imaging.  Nausea/vomiting: Chronic and intermittent.  Continues to have vomiting about every other day.  History of early satiety.  Recent EGD unremarkable.  Query gastroparesis.  Anemia: Chronic, stable.  No evidence of IDA.  Likely anemia of chronic disease.  No overt GI bleeding.  Plan: 1. F/U pending CT. further recommendations regarding diarrhea to follow.  May need work-up for secretory diarrhea. 2. Follow-up repeat stool studies. 3. Consider trial of cholestyramine pending CT. 4. Supportive measures.  We would like to thank you for the opportunity to participate in the care of [name].  Laureen Ochs. Bernarda Caffey [facility] [phone] 5/12/202211:25 AM     LOS: 0 days

## 2021-03-30 DIAGNOSIS — R338 Other retention of urine: Secondary | ICD-10-CM | POA: Diagnosis not present

## 2021-03-30 DIAGNOSIS — N183 Chronic kidney disease, stage 3 unspecified: Secondary | ICD-10-CM | POA: Diagnosis present

## 2021-03-30 DIAGNOSIS — I5042 Chronic combined systolic (congestive) and diastolic (congestive) heart failure: Secondary | ICD-10-CM | POA: Diagnosis not present

## 2021-03-30 DIAGNOSIS — U071 COVID-19: Secondary | ICD-10-CM

## 2021-03-30 DIAGNOSIS — R197 Diarrhea, unspecified: Secondary | ICD-10-CM

## 2021-03-30 DIAGNOSIS — R1030 Lower abdominal pain, unspecified: Secondary | ICD-10-CM

## 2021-03-30 DIAGNOSIS — I4891 Unspecified atrial fibrillation: Secondary | ICD-10-CM

## 2021-03-30 DIAGNOSIS — R933 Abnormal findings on diagnostic imaging of other parts of digestive tract: Secondary | ICD-10-CM

## 2021-03-30 DIAGNOSIS — R112 Nausea with vomiting, unspecified: Secondary | ICD-10-CM

## 2021-03-30 LAB — CBC
HCT: 28.6 % — ABNORMAL LOW (ref 36.0–46.0)
Hemoglobin: 8.5 g/dL — ABNORMAL LOW (ref 12.0–15.0)
MCH: 25.5 pg — ABNORMAL LOW (ref 26.0–34.0)
MCHC: 29.7 g/dL — ABNORMAL LOW (ref 30.0–36.0)
MCV: 85.9 fL (ref 80.0–100.0)
Platelets: 144 10*3/uL — ABNORMAL LOW (ref 150–400)
RBC: 3.33 MIL/uL — ABNORMAL LOW (ref 3.87–5.11)
RDW: 20.6 % — ABNORMAL HIGH (ref 11.5–15.5)
WBC: 6.2 10*3/uL (ref 4.0–10.5)
nRBC: 0 % (ref 0.0–0.2)

## 2021-03-30 LAB — GASTROINTESTINAL PANEL BY PCR, STOOL (REPLACES STOOL CULTURE)

## 2021-03-30 LAB — GLUCOSE, CAPILLARY
Glucose-Capillary: 97 mg/dL (ref 70–99)
Glucose-Capillary: 83 mg/dL (ref 70–99)
Glucose-Capillary: 130 mg/dL — ABNORMAL HIGH (ref 70–99)
Glucose-Capillary: 116 mg/dL — ABNORMAL HIGH (ref 70–99)

## 2021-03-30 LAB — RENAL FUNCTION PANEL
Albumin: 1.8 g/dL — ABNORMAL LOW (ref 3.5–5.0)
Anion gap: 5 (ref 5–15)
BUN: 11 mg/dL (ref 6–20)
CO2: 24 mmol/L (ref 22–32)
Calcium: 6.6 mg/dL — ABNORMAL LOW (ref 8.9–10.3)
Chloride: 104 mmol/L (ref 98–111)
Creatinine, Ser: 1.27 mg/dL — ABNORMAL HIGH (ref 0.44–1.00)
GFR, Estimated: 50 mL/min — ABNORMAL LOW (ref >60)
Glucose, Bld: 88 mg/dL (ref 70–99)
Phosphorus: 2.3 mg/dL — ABNORMAL LOW (ref 2.5–4.6)
Potassium: 3.7 mmol/L (ref 3.5–5.1)
Sodium: 133 mmol/L — ABNORMAL LOW (ref 135–145)

## 2021-03-30 MED ORDER — HYDROMORPHONE HCL 1 MG/ML IJ SOLN
1.0000 mg | Freq: Once | INTRAMUSCULAR | Status: AC
  Administered 2021-03-30: 1 mg via INTRAVENOUS
  Filled 2021-03-30: qty 1

## 2021-03-30 MED ORDER — ZINC SULFATE 220 (50 ZN) MG PO CAPS
220.0000 mg | ORAL_CAPSULE | Freq: Every day | ORAL | Status: DC
  Administered 2021-03-30 – 2021-04-02 (×4): 220 mg via ORAL
  Filled 2021-03-30 (×4): qty 1

## 2021-03-30 MED ORDER — TORSEMIDE 20 MG PO TABS
20.0000 mg | ORAL_TABLET | Freq: Every day | ORAL | Status: DC
  Administered 2021-03-30: 20 mg via ORAL
  Filled 2021-03-30: qty 1

## 2021-03-30 MED ORDER — CHLORHEXIDINE GLUCONATE CLOTH 2 % EX PADS
6.0000 | MEDICATED_PAD | Freq: Every day | CUTANEOUS | Status: DC
  Administered 2021-03-30 – 2021-04-02 (×3): 6 via TOPICAL

## 2021-03-30 MED ORDER — OCTREOTIDE ACETATE 100 MCG/ML IJ SOLN
50.0000 ug | Freq: Two times a day (BID) | INTRAMUSCULAR | Status: DC
  Administered 2021-03-30 – 2021-04-02 (×6): 50 ug via SUBCUTANEOUS
  Filled 2021-03-30: qty 0.5
  Filled 2021-03-30: qty 1
  Filled 2021-03-30: qty 0.5
  Filled 2021-03-30 (×6): qty 1
  Filled 2021-03-30: qty 0.5
  Filled 2021-03-30: qty 1
  Filled 2021-03-30: qty 0.5

## 2021-03-30 MED ORDER — SODIUM CHLORIDE 0.9 % IV SOLN: 200.0000 mg | Freq: Once | INTRAVENOUS | Status: DC

## 2021-03-30 MED ORDER — AMIODARONE HCL 200 MG PO TABS
200.0000 mg | ORAL_TABLET | Freq: Every day | ORAL | Status: DC
  Administered 2021-03-30 – 2021-04-02 (×4): 200 mg via ORAL
  Filled 2021-03-30 (×4): qty 1

## 2021-03-30 MED ORDER — PROCHLORPERAZINE EDISYLATE 10 MG/2ML IJ SOLN
5.0000 mg | Freq: Once | INTRAMUSCULAR | Status: AC
  Administered 2021-03-30: 5 mg via INTRAVENOUS
  Filled 2021-03-30: qty 2

## 2021-03-30 MED ORDER — POTASSIUM CHLORIDE CRYS ER 20 MEQ PO TBCR
40.0000 meq | EXTENDED_RELEASE_TABLET | Freq: Once | ORAL | Status: AC
  Administered 2021-03-30: 40 meq via ORAL
  Filled 2021-03-30: qty 2

## 2021-03-30 MED ORDER — SODIUM CHLORIDE 0.9 % IV SOLN
100.0000 mg | Freq: Every day | INTRAVENOUS | Status: DC
  Administered 2021-03-31 – 2021-04-02 (×3): 100 mg via INTRAVENOUS
  Filled 2021-03-30: qty 20
  Filled 2021-03-30: qty 100
  Filled 2021-03-30 (×2): qty 20

## 2021-03-30 MED ORDER — MAGNESIUM SULFATE 2 GM/50ML IV SOLN
2.0000 g | Freq: Once | INTRAVENOUS | Status: AC
  Administered 2021-03-30: 2 g via INTRAVENOUS
  Filled 2021-03-30: qty 50

## 2021-03-30 MED ORDER — ADULT MULTIVITAMIN W/MINERALS CH
1.0000 | ORAL_TABLET | Freq: Every day | ORAL | Status: DC
  Administered 2021-03-30 – 2021-04-02 (×4): 1 via ORAL
  Filled 2021-03-30 (×4): qty 1

## 2021-03-30 MED ORDER — SODIUM CHLORIDE 0.9 % IV SOLN: 100.0000 mg | Freq: Every day | INTRAVENOUS | Status: DC

## 2021-03-30 MED ORDER — GUAIFENESIN-DM 100-10 MG/5ML PO SYRP
10.0000 mL | ORAL_SOLUTION | ORAL | Status: DC | PRN
  Administered 2021-04-01: 10 mL via ORAL
  Filled 2021-03-30: qty 10

## 2021-03-30 MED ORDER — ASCORBIC ACID 500 MG PO TABS
500.0000 mg | ORAL_TABLET | Freq: Every day | ORAL | Status: DC
  Administered 2021-03-30 – 2021-04-02 (×4): 500 mg via ORAL
  Filled 2021-03-30 (×4): qty 1

## 2021-03-30 MED ORDER — SODIUM CHLORIDE 0.9 % IV SOLN
100.0000 mg | INTRAVENOUS | Status: AC
  Administered 2021-03-30: 100 mg via INTRAVENOUS
  Filled 2021-03-30: qty 20
  Filled 2021-03-30: qty 100

## 2021-03-30 MED ORDER — NYSTATIN 100000 UNIT/GM EX POWD
Freq: Three times a day (TID) | CUTANEOUS | Status: DC
  Filled 2021-03-30: qty 15

## 2021-03-30 MED ORDER — DIPHENOXYLATE-ATROPINE 2.5-0.025 MG PO TABS
1.0000 | ORAL_TABLET | Freq: Three times a day (TID) | ORAL | Status: DC
  Administered 2021-03-30 – 2021-04-02 (×9): 1 via ORAL
  Filled 2021-03-30 (×9): qty 1

## 2021-03-30 NOTE — Progress Notes (Signed)
Pt complains of rectal tube irritation at this time. Pt rectum checked, tube repositioned and alittle air taken from tube to make comfort for patient. Tube is draining watery Kaz[place] Elena and yellow stools at this time. Pt given tylenol for pain. Will continue to monitor closely.

## 2021-03-30 NOTE — TOC Initial Note (Addendum)
Transition of Care [facility]) - Initial/Assessment Note    Patient Details  Name: [name] MRN: [id number] Date of Birth: [birth year]  Transition of Care [facility]) CM/SW Contact:    Ihor Gully, LCSW Phone Number: 03/30/2021, 3:35 PM  Clinical Narrative:                 Patient from home with spouse. Known to TOC. As previously indicated during previous admissions, patient does not qualify for Medicaid therefore, she cannot get several hours a day home health/CAP services. She did not qualify for meals on wheels long term due to her age. Her insurance did recently approve for her to get 14 meals that have been delivered and placed in the freezer. Patient states that she can change her own chux pads and adult diaper when soiled. Patient states that she normally eats once a day when her spouse gets home, however they live with her father in law who is at home with her during the day.  Review of record shows that patient had a hospital bed ordered two weeks ago through [facility]. Follow up with [name] Munro at [place], indicates [facility] attempted to reach patient three times unsuccessfully. They cannot deliver bed without having a card on file as bed is a rental. Spoke with patient regarding issue. Patient states that she has not received a call from [place]. Spoke with [name] Munro at [employer] again and requested that she contact patient in her room and provided room phone number.  Patient is active with [facility] RN.  Expected Discharge Plan: [place] Barriers to Discharge: Continued Medical Work up   Patient Goals and CMS Choice        Expected Discharge Plan and Services Expected Discharge Plan: [place]                                              Prior Living Arrangements/Services                       Activities of Daily Living Home Assistive Devices/Equipment: Wheelchair ADL Screening (condition at time of admission) Patient's cognitive ability  adequate to safely complete daily activities?: Yes Is the patient deaf or have difficulty hearing?: No Does the patient have difficulty seeing, even when wearing glasses/contacts?: Yes Does the patient have difficulty concentrating, remembering, or making decisions?: No Patient able to express need for assistance with ADLs?: Yes Does the patient have difficulty dressing or bathing?: Yes Independently performs ADLs?: No Communication: Independent Dressing (OT): Needs assistance Is this a change from baseline?: Pre-admission baseline Grooming: Needs assistance Is this a change from baseline?: Pre-admission baseline Feeding: Independent Bathing: Dependent Is this a change from baseline?: Pre-admission baseline Toileting: Dependent Is this a change from baseline?: Pre-admission baseline In/Out Bed: Dependent Is this a change from baseline?: Pre-admission baseline Walks in Home: Needs assistance Is this a change from baseline?: Pre-admission baseline Does the patient have difficulty walking or climbing stairs?: Yes Weakness of Legs: None Weakness of Arms/Hands: None  Permission Sought/Granted                  Emotional Assessment              Admission diagnosis:  Hypocalcemia [E83.51] Diarrhea [R19.7] Hypokalemia [E87.6] Diarrhea, unspecified type [R19.7] Patient Active Problem List   Diagnosis Date  Noted  . Acute urinary retention 03/30/2021  . CKD (chronic kidney disease) stage 3A, GFR 30-59 ml/min  03/30/2021  . COVID-19 virus infection 03/29/2021  . Hypokalemia due to excessive gastrointestinal loss of potassium 03/29/2021  . Hypomagnesemia 03/29/2021  . Diarrhea   . Non-intractable vomiting   . Left lower quadrant abdominal pain   . Hypokalemia 03/09/2021  . Electrolyte abnormality 03/08/2021  . S/P bilateral above knee amputation ([place]) 03/08/2021  . 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 CKD stage 4 due to  type 2 diabetes mellitus ([place]) 12/05/2020  . Hyperlipidemia associated with type 2 diabetes mellitus ([place]) 12/05/2020  . Controlled type 2 diabetes mellitus with stage 4 chronic kidney disease, with long-term current use of insulin ([place]) 12/05/2020  . S/P BKA (below knee amputation) unilateral, right ([place]) 12/05/2020  . Anemia due to stage 4 chronic kidney disease ([place]) 12/05/2020  . Leukopenia 11/29/2020  . Elevated brain natriuretic peptide (BNP) level 11/29/2020  . Elevated d-dimer 11/29/2020  . Prolonged QT interval 11/29/2020  . Stable treated proliferative diabetic retinopathy of left eye determined by examination associated with type 2 diabetes mellitus ([place]) 08/14/2020  . Macular pucker, left eye 02/24/2020  . Follow-up examination after eye surgery 02/24/2020  . Coronary artery disease involving native coronary artery of native heart without angina pectoris 11/21/2018  . Ischemic cardiomyopathy 11/21/2018  . Essential hypertension 11/21/2018  . Labile blood pressure   . Diabetic retinopathy associated with type 2 diabetes mellitus ([place])   . Hypoalbuminemia due to protein-calorie malnutrition ([place])   . Type 2 diabetes mellitus with peripheral neuropathy (HCC)   . Chronic combined systolic (congestive) and diastolic (congestive) heart failure ([place])   . Unilateral AKA, left ([place])   . Atrial fibrillation with rapid ventricular response ([place])   . Diabetic peripheral neuropathy ([place])   . Anemia of chronic disease   . Gastroesophageal reflux disease   . Non-ST elevation (NSTEMI) myocardial infarction ([place])   . Acute combined systolic and diastolic heart failure ([place])   . Acute respiratory failure with hypoxia ([place])   . Microcytic anemia 04/25/2018  . Pulmonary nodule 04/25/2018  . Gangrene of lower extremity ([place]) 04/25/2018  . Femoral-popliteal bypass graft occlusion, left ([place]) 12/02/2017  . Respiratory failure with hypoxia ([place]) 12/02/2017  . Leukocytosis 12/02/2017  .  Paroxysmal atrial fibrillation with rapid ventricular response ([place]) 12/02/2017  . Hyperkalemia 06/26/2017  . Elevated lactic acid level   . Chronic back pain 11/27/2016  . Hyperlipidemia LDL goal <70   . Retinal tumor of right eye   . 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 Paresthesia of both hands 03/05/2016  . DDD (degenerative disc disease), lumbosacral 03/26/2015  . DDD (degenerative disc disease), cervical 03/26/2015  . Sacroiliac joint disease 03/26/2015  . Occipital neuralgia 03/26/2015  . Pain in limb-Bilateral Leg 04/25/2014  . Peripheral vascular disease ([place]) 04/05/2013  . 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) 02/10/2012   PCP:  Frazier Richards, MD Pharmacy:   [place] [place] - [address] [id number] [place] [address] Phone: [phone] Fax: [phone]     Social Determinants of Health (SDOH) Interventions    Readmission Risk Interventions Readmission Risk Prevention Plan 03/15/2021 12/02/2020 11/30/2020  Transportation Screening Complete Complete Complete  PCP or Specialist Appt within 5-7 Days - - -  PCP or Specialist Appt within 3-5 Days - Complete -  Home  Care Screening - - -  Medication Review (RN CM) - - -  HRI or [place] - Complete Complete  Social Work Consult for [place] Planning/Counseling - Complete Complete  Palliative Care Screening - Not Applicable Not Applicable  Medication Review Press photographer) Complete Complete Complete  HRI or Home Care Consult Complete - -  SW Recovery Care/Counseling Consult Complete - -  Palliative Care Screening Not Applicable - -  [place] Not Applicable - -  Some recent data might be hidden

## 2021-03-30 NOTE — Progress Notes (Addendum)
Just received call from lab that stool collection was not done correctly and that they needed 3 stool samples not one large one as it was asked yesterday, [phone], how to collect it and container from lab tech was given to this RN and explained how to collect from the lab at [facility]. This call was received after RN gave night medications per patient request and has to adjust flexi seal as it was leaking. Bath completed at this time. Pt is coughing at this time and lung sounds are mildly course at this time. MD Olevia Bowens made aware of temperature, tylenol given for pain per patient request 3/10 in the buttocks. Foley care completed. MD made aware of stool collection issue via secure chat.  Also paged MD Rehman to make aware.   2030: MD Rehman called writer back and order placed for sodium stool, osmolality stool , and awaiting Stephanie in lab to call back about quantitative fecal fat not qualitative as it is not in epic as quantitative.   [address] called writer back at this time, Fecal quantitative fat stool sample must be a send out through [place] and it is 72 hour collections. MD Rehman called and notified and he states he wants it 24 hours and will call lab at this time and call Rn back.   2100: MD called and states he spoke to lab and told them to make it 24 hours and send lab at this time.  2133: Fecal electrolytes, fecal osmo, and fecal lactoferrin brought to lab at this time as rectal tube was leaking. Stool sample able to be collected at this time, new rectal tube placed. Stool brought to lab at this time by Probation officer.   03/31/2021 0503am: rectal tube was not leaking until now this shift. Patient cleansed, CHG bath performed and rectal tube irrigated at this time. Small noted BM at this time, seeping around tube, tube has noted stool, tan in color in it. Loose, pieces noted in tube.

## 2021-03-30 NOTE — Progress Notes (Addendum)
Patient Demographics:    [name], is a 55 y.o. female, DOB - 1966/01/13, TRZ:[id number]  Admit date - 03/28/2021   Admitting Physician Nadim Malia Denton Brick, MD  Outpatient Primary MD for the patient is Adamo, [name], MD  LOS - 1  Chief Complaint  Patient presents with  . Diarrhea        Subjective:    [name] today has no fevers, no emesis,  No chest pain,   -Diarrhea persist--rectal tube in situ -Patient unable to void, bladder scan reveals over 700 mL of urine Foley catheter placed-for acute urinary retention and for 24-hour urine collection   Assessment  & Plan :    Principal Problem:   Diarrhea Active Problems:   COVID-19 virus infection   Hypokalemia due to excessive gastrointestinal loss of potassium   Hypomagnesemia   Acute urinary retention -CKD (chronic kidney disease) stage 3A, GFR 30-59 ml/min    Atrial fibrillation with rapid ventricular response (HCC)   Diabetic peripheral neuropathy (HCC)   Chronic combined systolic (congestive) and diastolic (congestive) heart failure (HCC)   Coronary artery disease involving native coronary artery of native heart without angina pectoris   Hyperlipidemia associated with type 2 diabetes mellitus ([place])  Brief Summary:- 55 y.o. female, with type 2 diabetes mellitus, lipidemia, stage IV CKD, hypertension, ischemic cardiomyopathy with CAD, GERD, history of non-STEMI, paroxysmal A. fib, bilateral amputee, with known history of persistent diarrhea readmitted with worsening diarrhea and electrolyte derangement  A/p 1)Diarrhea--chronic but worse lately GI consult appreciated--recommended  Fecal lactoferrin  Fecal pancreatic elastase.  Stool osmolality  24-hour stool collection for qualitative fecal fat analysis and electrolytes.  24-hour urine collection for 5-hydroxyindoleacetic acid  -He had EGD and colonoscopy -CT abdomen and pelvis noted  with possible mild colitis -GI service awaiting further stool studies prior to initiating Imodium or Lomotil or octreotide  2) possible liver cirrhosis--- GI service is following, currently not encephalopathic  3) severe hypokalemia/severe hypomagnesemia/hypocalcemia -due to chronic diarrhea--- replace and recheck  4)Malabsorption  syndrome with electrolyte and vitamin deficiencies--- replace and recheck  5)PAfib -atrial fibrillation, continue metoprolol for rate control continue Xarelto for stroke prophylaxis Okay to resume amiodarone--- watch electrolytes closely -Repeat EKG in a.m----patient with history of QTC prolongation  6)Chronic combined systolic/diastolic CHF - Last 2D echocardiogram 11/2020 with LVEF 35-40% -Edema noted, okay to restart torsemide at 20 mg daily   7)Controlled type 2 diabetes -A1c is 5.7  Use Novolog/Humalog Sliding scale insulin with Accu-Cheks/Fingersticks as ordered  8)CKD stageIIIA -renal function at baseline, -- renally adjust medications, avoid nephrotoxic agents / dehydration  / hypotension  9)Peripheral vascular disease with bilateral above-knee amputations -Continue with statin and Xarelto  10)diabetic neuropathy -Continue with Lyrica, home amitriptyline given electrolyte abnormalities  11)Tobacco abuse continue with nicotine patch  12)Covid 19 infection--- patient is vaccinated but not boosted Trend and follow inflammatory markers, continue IV remdesivir for 3 days only  13)Acute Urinary Retention-----Patient unable to void, bladder scan reveals over 700 mL of urine Foley catheter placed-for acute urinary retention and for 24-hour urine collection  Disposition/Need for in-Hospital Stay- patient unable to be discharged at this time due to -significant profuse frequent loose stools with significant life-threatening electrolyte abnormalities requiring IV replacements*  Status is: Inpatient  Remains inpatient appropriate because:Please  see disposition above   Disposition: The patient is from: Home              Anticipated d/c is to: Home              Anticipated d/c date is: 2 days              Patient currently is not medically stable to d/c. Barriers: Not Clinically Stable-   Code Status : -  Code Status: Full Code   Family Communication:   NA (patient is alert, awake and coherent)   Consults  :  Gi  DVT Prophylaxis  :   - SCDs   Rivaroxaban (XARELTO) tablet 15 mg    Lab Results  Component Value Date   PLT 144 (L) 03/30/2021    Inpatient Medications  Scheduled Meds: . amiodarone  200 mg Oral Daily  . vitamin C  500 mg Oral Daily  . insulin aspart  0-15 Units Subcutaneous TID WC  . insulin aspart  0-5 Units Subcutaneous QHS  . metoprolol tartrate  12.5 mg Oral BID  . multivitamin with minerals  1 tablet Oral Daily  . nicotine  21 mg Transdermal Daily  . nystatin   Topical TID  . pantoprazole  40 mg Oral Daily  . potassium chloride  40 mEq Oral Once  . pregabalin  100 mg Oral BID  . Rivaroxaban  15 mg Oral Q supper  . rosuvastatin  20 mg Oral q1800  . sodium bicarbonate  650 mg Oral BID  . torsemide  20 mg Oral Daily  . cyanocobalamin  1,000 mcg Oral Daily  . zinc sulfate  220 mg Oral Daily   Continuous Infusions: . sodium chloride 150 mL/hr at 03/30/21 0628  . magnesium sulfate bolus IVPB    . remdesivir 100 mg in NS 100 mL    . [START ON 03/31/2021] remdesivir 100 mg in NS 100 mL     PRN Meds:.acetaminophen, dicyclomine    Anti-infectives (From admission, onward)   Start     Dose/Rate Route Frequency Ordered Stop   03/31/21 1000  remdesivir 100 mg in sodium chloride 0.9 % 100 mL IVPB  Status:  Discontinued       "Followed by" Linked Group Details   100 mg 200 mL/hr over 30 Minutes Intravenous Daily 03/30/21 1210 03/30/21 1220   03/31/21 1000  remdesivir 100 mg in sodium chloride 0.9 % 100 mL IVPB        100 mg 200 mL/hr over 30 Minutes Intravenous Daily 03/30/21 1226 04/04/21 0959    03/30/21 1300  remdesivir 200 mg in sodium chloride 0.9% 250 mL IVPB  Status:  Discontinued       "Followed by" Linked Group Details   200 mg 580 mL/hr over 30 Minutes Intravenous Once 03/30/21 1210 03/30/21 1220   03/30/21 1300  remdesivir 100 mg in sodium chloride 0.9 % 100 mL IVPB        100 mg 200 mL/hr over 30 Minutes Intravenous Every 30 min 03/30/21 1219 03/30/21 1359   03/29/21 1000  remdesivir 100 mg in sodium chloride 0.9 % 100 mL IVPB  Status:  Discontinued       "Followed by" Linked Group Details   100 mg 200 mL/hr over 30 Minutes Intravenous Daily 03/28/21 2132 03/28/21 2134   03/29/21 1000  remdesivir 100 mg in sodium chloride 0.9 % 100 mL IVPB        100 mg 200  mL/hr over 30 Minutes Intravenous Daily 03/28/21 2136 03/30/21 1222   03/28/21 2206  remdesivir 100 mg in sodium chloride 0.9 % 100 mL IVPB       "Followed by" Linked Group Details   100 mg 200 mL/hr over 30 Minutes Intravenous  Once 03/28/21 2137 03/29/21 0108   03/28/21 2145  remdesivir 100 mg in sodium chloride 0.9 % 100 mL IVPB       "Followed by" Linked Group Details   100 mg 200 mL/hr over 30 Minutes Intravenous  Once 03/28/21 2137 03/29/21 0107   03/28/21 2130  remdesivir 200 mg in sodium chloride 0.9% 250 mL IVPB  Status:  Discontinued       "Followed by" Linked Group Details   200 mg 580 mL/hr over 30 Minutes Intravenous Once 03/28/21 2132 03/28/21 2134        Objective:   Vitals:   03/29/21 1548 03/29/21 1942 03/30/21 0126 03/30/21 0522  BP: 129/68 129/65 (!) 98/55 102/64  Pulse: 64 69 63   Resp: 18 20 20    Temp: 98.1 F (36.7 C)  98.3 F (36.8 C)   TempSrc: Oral  Oral   SpO2: 99% 99% 100%   Weight:      Height:        Wt Readings from Last 3 Encounters:  03/28/21 74.8 kg  03/08/21 101.2 kg  12/05/20 101.2 kg     Intake/Output Summary (Last 24 hours) at 03/30/2021 1309 Last data filed at 03/30/2021 8563 Gross per 24 hour  Intake 2037.24 ml  Output 0 ml  Net 2037.24 ml     Physical Exam  Gen:- Awake Alert,  In .no apparent distress  HEENT:- [place].AT, No sclera icterus, legally blind Neck-Supple Neck,No JVD,.  Lungs-  CTAB , fair symmetrical air movement CV- S1, S2 normal, regular  Abd-  +ve B.Sounds, Abd Soft, No tenderness,    Extremity/Skin:- +ve  edema, pedal pulses present  Psych-affect is appropriate, oriented x3 Neuro-no new focal deficits, no tremors MSK-bil  Amputee GU-Foley catheter in situ Rectal--Flexi-Seal rectal tube in situ   Data Review:   Micro Results Recent Results (from the past 240 hour(s))  Resp Panel by RT-PCR (Flu A&B, Covid) Nasopharyngeal Swab     Status: Abnormal   Collection Time: 03/28/21  7:22 PM   Specimen: Nasopharyngeal Swab; Nasopharyngeal(NP) swabs in vial transport medium  Result Value Ref Range Status   SARS Coronavirus 2 by RT PCR POSITIVE (A) NEGATIVE Final    Comment: RESULT CALLED TO, READ BACK BY AND VERIFIED WITH: WATLINGTON,K RN @2114  03/28/21 BILLINGSLEY,L (NOTE) SARS-CoV-2 target nucleic acids are DETECTED.  The SARS-CoV-2 RNA is generally detectable in upper respiratory specimens during the acute phase of infection. Positive results are indicative of the presence of the identified virus, but do not rule out bacterial infection or co-infection with other pathogens not detected by the test. Clinical correlation with patient history and other diagnostic information is necessary to determine patient infection status. The expected result is Negative.  Fact Sheet for Patients: EntrepreneurPulse.com.au  Fact Sheet for Healthcare Providers: IncredibleEmployment.be  This test is not yet approved or cleared by the [place] FDA and  has been authorized for detection and/or diagnosis of SARS-CoV-2 by FDA under an Emergency Use Authorization (EUA).  This EUA will remain in effect (meaning this t est can be used) for the duration of  the COVID-19 declaration under  Section 564(b)(1) of the Act, 21 U.S.C. section 360bbb-3(b)(1), unless the authorization is terminated or revoked  sooner.     Influenza A by PCR NEGATIVE NEGATIVE Final   Influenza B by PCR NEGATIVE NEGATIVE Final    Comment: (NOTE) The Xpert Xpress SARS-CoV-2/FLU/RSV plus assay is intended as an aid in the diagnosis of influenza from Nasopharyngeal swab specimens and should not be used as a sole basis for treatment. Nasal washings and aspirates are unacceptable for Xpert Xpress SARS-CoV-2/FLU/RSV testing.  Fact Sheet for Patients: EntrepreneurPulse.com.au  Fact Sheet for Healthcare Providers: IncredibleEmployment.be  This test is not yet approved or cleared by the [place] FDA and has been authorized for detection and/or diagnosis of SARS-CoV-2 by FDA under an Emergency Use Authorization (EUA). This EUA will remain in effect (meaning this test can be used) for the duration of the COVID-19 declaration under Section 564(b)(1) of the Act, 21 U.S.C. section 360bbb-3(b)(1), unless the authorization is terminated or revoked.  Performed at [facility], [address], [place], [place] [address]   C Difficile Quick Screen w PCR reflex     Status: None   Collection Time: 03/29/21  8:46 AM   Specimen: STOOL  Result Value Ref Range Status   C Diff antigen NEGATIVE NEGATIVE Final   C Diff toxin NEGATIVE NEGATIVE Final   C Diff interpretation No C. difficile detected.  Final    Comment: NEGATIVE Performed at [facility], [address], [place], [place] [address]   Gastrointestinal Panel by PCR , Stool     Status: None   Collection Time: 03/29/21  8:46 AM   Specimen: STOOL  Result Value Ref Range Status   Campylobacter species NOT DETECTED NOT DETECTED Final   Plesimonas shigelloides NOT DETECTED NOT DETECTED Final   Salmonella species NOT DETECTED NOT DETECTED Final   Yersinia enterocolitica NOT DETECTED NOT DETECTED Final   Vibrio species  NOT DETECTED NOT DETECTED Final   Vibrio cholerae NOT DETECTED NOT DETECTED Final   Enteroaggregative E coli (EAEC) NOT DETECTED NOT DETECTED Final   Enteropathogenic E coli (EPEC) NOT DETECTED NOT DETECTED Final   Enterotoxigenic E coli (ETEC) NOT DETECTED NOT DETECTED Final   Shiga like toxin producing E coli (STEC) NOT DETECTED NOT DETECTED Final   Shigella/Enteroinvasive E coli (EIEC) NOT DETECTED NOT DETECTED Final   Cryptosporidium NOT DETECTED NOT DETECTED Final   Cyclospora cayetanensis NOT DETECTED NOT DETECTED Final   Entamoeba histolytica NOT DETECTED NOT DETECTED Final   Giardia lamblia NOT DETECTED NOT DETECTED Final   Adenovirus F40/41 NOT DETECTED NOT DETECTED Final   Astrovirus NOT DETECTED NOT DETECTED Final   Norovirus GI/GII NOT DETECTED NOT DETECTED Final   Rotavirus A NOT DETECTED NOT DETECTED Final   Sapovirus (I, II, IV, and V) NOT DETECTED NOT DETECTED Final    Comment: Performed at [facility], [address], [place], [place] [address]    Radiology Reports CT ABDOMEN PELVIS W CONTRAST  Result Date: 03/29/2021 CLINICAL DATA:  Acute abdominal pain with continued diarrhea and occasional vomiting. EXAM: CT ABDOMEN AND PELVIS WITH CONTRAST TECHNIQUE: Multidetector CT imaging of the abdomen and pelvis was performed using the standard protocol following bolus administration of intravenous contrast. CONTRAST:  142mL OMNIPAQUE IOHEXOL 300 MG/ML  SOLN COMPARISON:  Most recent CT 11/30/2017 FINDINGS: Lower chest: Small bilateral pleural effusions, present on prior exam, minimally increased. Left basilar opacity is similar, likely scarring/chronic atelectasis. Heart is enlarged. Coronary artery calcifications. Small pericardial effusion. Hepatobiliary: Diffusely heterogeneous appearance of the liver. Slight nodular hepatic contours suggest cirrhosis. No obvious focal hepatic lesion allowing for  phase of contrast. Post cholecystectomy. There is no biliary dilatation.  Small volume perihepatic ascites. Pancreas: No ductal dilatation or inflammation. Spleen: Multiple rounded low-density lesions scattered throughout the spleen with lobulated borders, largest measuring 18 mm in the upper and lower spleen. Heterogeneous splenic enhancement likely related to phase of contrast. No splenomegaly. Adrenals/Urinary Tract: 11 mm left adrenal nodule. Normal right adrenal gland. No hydronephrosis. Bilateral perinephric edema, right greater than left. Mild thinning of bilateral renal parenchyma. No visualized renal or ureteral calculi. No focal renal abnormality. Urinary bladder is partially distended, thick walled. Stomach/Bowel: Unremarkable stomach. No small bowel obstruction. No evidence of small bowel inflammation, mild small bowel thickening felt to be reactive secondary to ascites. Normal appendix. There is liquid stool in the ascending and proximal transverse colon. Equivocal wall thickening of the descending colon. No bowel pneumatosis. 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 Bifem bypass graft appears patent. Stent in the right common femoral artery which is patent. The left common femoral artery appears occluded. Patent portal vein. Patent mesenteric vasculature. No portal venous or mesenteric gas. No enlarged lymph nodes in the abdomen or pelvis. Reproductive: Normal for age uterine atrophy.  No adnexal mass. Other: Small to moderate volume abdominopelvic ascites. There is prominent subcutaneous body wall edema. No free air or focal fluid collection. Postsurgical change of the anterior abdominal wall. Musculoskeletal: Postsurgical change in the lower lumbar spine bones appear diffusely under mineralized. Probable bone graft donor site right iliac crest. IMPRESSION: 1. Liquid stool in the ascending and proximal transverse colon with equivocal wall thickening of the descending colon. Findings likely represent  diarrheal illness in possible mild colitis of the descending colon. 2. Heterogeneous appearance of the liver with nodular hepatic contours, suggesting cirrhosis. 3. Small to moderate volume abdominopelvic ascites. Prominent subcutaneous body wall edema. Findings suggest third-spacing. 4. Bilateral perinephric edema, right greater than left, as well as bladder wall thickening. This may be due to underlying chronic renal disease, however possibility of urinary tract infection is raised. Recommend correlation with urinalysis. 5. Multiple rounded low-density lesions scattered throughout the spleen, largest measuring 18 mm in the upper and lower spleen. These are nonspecific and may be cysts or hemangiomas. 6. Small bilateral pleural effusions, minimally increased from prior exam. 7. Indeterminate 11 mm left adrenal nodule. Aortic Atherosclerosis (ICD10-I70.0). Electronically Signed   By: Keith Rake M.D.   On: 03/29/2021 15:47   US RENAL  Result Date: 03/16/2021 CLINICAL DATA:  Acute kidney injury, abnormal labs today EXAM: RENAL / URINARY TRACT ULTRASOUND COMPLETE COMPARISON:  Renal ultrasound 10/10/2020 FINDINGS: Right Kidney: Renal measurements: 13.2 x 5.7 x 5.4 cm = volume: 213.6 mL. Echogenicity within normal limits. No mass or hydronephrosis visualized. Left Kidney: Renal measurements: 11.7 x 6.7 x 5.1 cm = volume: 209.1 mL. Limited visualization, however visualized portions demonstrate normal echogenicity without hydronephrosis. Bladder: Appears normal for degree of bladder distention. Bilateral ureteral jets are seen. Other: None. IMPRESSION: No hydronephrosis. Electronically Signed   By: Maurine Simmering   On: 03/16/2021 08:45     CBC Recent Labs  Lab 03/28/21 1420 03/29/21 0350 03/30/21 0611  WBC 5.7 5.3 6.2  HGB 9.4* 9.2* 8.5*  HCT 30.3* 30.6* 28.6*  PLT 172 148* 144*  MCV 84.9 86.0 85.9  MCH 26.3 25.8* 25.5*  MCHC 31.0 30.1 29.7*  RDW 20.5* 20.4* 20.6*  LYMPHSABS 0.5*  --   --   MONOABS  0.3  --   --  EOSABS 0.1  --   --   BASOSABS 0.0  --   --     Chemistries  Recent Labs  Lab 03/28/21 1420 03/28/21 1700 03/29/21 0350 03/30/21 0611  NA 137  --  136 133*  K 2.7*  --  3.6 3.7  CL 107  --  109 104  CO2 24  --  22 24  GLUCOSE 85  --  94 88  BUN 7  --  6 11  CREATININE 1.23*  --  1.24* 1.27*  CALCIUM 6.6*  --  6.6* 6.6*  MG  --  1.0* 1.8  --   AST 11*  --  10*  --   ALT 11  --  10  --   ALKPHOS 92  --  91  --   BILITOT 0.5  --  0.4  --    ------------------------------------------------------------------------------------------------------------------ No results for input(s): CHOL, HDL, LDLCALC, TRIG, CHOLHDL, LDLDIRECT in the last 72 hours.  Lab Results  Component Value Date   HGBA1C 5.6 03/09/2021   ------------------------------------------------------------------------------------------------------------------ No results for input(s): TSH, T4TOTAL, T3FREE, THYROIDAB in the last 72 hours.  Invalid input(s): FREET3 ------------------------------------------------------------------------------------------------------------------ No results for input(s): VITAMINB12, FOLATE, FERRITIN, TIBC, IRON, RETICCTPCT in the last 72 hours.  Coagulation profile No results for input(s): INR, PROTIME in the last 168 hours.  No results for input(s): DDIMER in the last 72 hours.  Cardiac Enzymes No results for input(s): CKMB, TROPONINI, MYOGLOBIN in the last 168 hours.  Invalid input(s): CK ------------------------------------------------------------------------------------------------------------------    Component Value Date/Time   BNP 2,905.0 (H) 12/01/2020 9323   Roxan Hockey M.D on 03/30/2021 at 1:09 PM  Go to [email or web address] - for contact info  [name] - Office  [phone]

## 2021-03-30 NOTE — Progress Notes (Addendum)
Subjective:  Patient states she kept her breakfast and lunch down today.  She says she ate almost all of her meals.  She complains of pain in left lower quadrant of her abdomen.  She says pain is now mild.  Current Medications:  Current Facility-Administered Medications:  .  0.9 %  sodium chloride infusion, , Intravenous, Continuous, Emokpae, Courage, MD, Last Rate: 20 mL/hr at 03/30/21 1324, Rate Change at 03/30/21 1324 .  acetaminophen (TYLENOL) tablet 650 mg, 650 mg, Oral, Q6H PRN, Reubin Milan, MD, 650 mg at 03/29/21 2331 .  amiodarone (PACERONE) tablet 200 mg, 200 mg, Oral, Daily, Emokpae, Courage, MD, 200 mg at 03/30/21 1335 .  ascorbic acid (VITAMIN C) tablet 500 mg, 500 mg, Oral, Daily, Emokpae, Courage, MD, 500 mg at 03/30/21 1335 .  Chlorhexidine Gluconate Cloth 2 % PADS 6 each, 6 each, Topical, Daily, Emokpae, Courage, MD, 6 each at 03/30/21 1612 .  dicyclomine (BENTYL) capsule 10 mg, 10 mg, Oral, Q6H PRN, Elgergawy, Dawood S, MD .  insulin aspart (novoLOG) injection 0-15 Units, 0-15 Units, Subcutaneous, TID WC, Elgergawy, Silver Huguenin, MD, 2 Units at 03/29/21 1801 .  insulin aspart (novoLOG) injection 0-5 Units, 0-5 Units, Subcutaneous, QHS, Elgergawy, Dawood S, MD .  magnesium sulfate IVPB 2 g 50 mL, 2 g, Intravenous, Once, Emokpae, Courage, MD .  metoprolol tartrate (LOPRESSOR) tablet 12.5 mg, 12.5 mg, Oral, BID, Elgergawy, Silver Huguenin, MD, 12.5 mg at 03/30/21 0813 .  multivitamin with minerals tablet 1 tablet, 1 tablet, Oral, Daily, Emokpae, Courage, MD, 1 tablet at 03/30/21 1334 .  nicotine (NICODERM CQ - dosed in mg/24 hours) patch 21 mg, 21 mg, Transdermal, Daily, Elgergawy, Silver Huguenin, MD, 21 mg at 03/30/21 0819 .  nystatin (MYCOSTATIN/NYSTOP) topical powder, , Topical, TID, Reubin Milan, MD, Given at 03/30/21 1612 .  pantoprazole (PROTONIX) EC tablet 40 mg, 40 mg, Oral, Daily, Elgergawy, Silver Huguenin, MD, 40 mg at 03/30/21 0819 .  pregabalin (LYRICA) capsule 100 mg, 100  mg, Oral, BID, Elgergawy, Silver Huguenin, MD, 100 mg at 03/30/21 0813 .  [START ON 03/31/2021] remdesivir 100 mg in sodium chloride 0.9 % 100 mL IVPB, 100 mg, Intravenous, Daily, Emokpae, Courage, MD .  Rivaroxaban (XARELTO) tablet 15 mg, 15 mg, Oral, Q supper, Elgergawy, Silver Huguenin, MD, 15 mg at 03/29/21 1802 .  rosuvastatin (CRESTOR) tablet 20 mg, 20 mg, Oral, q1800, Elgergawy, Silver Huguenin, MD, 20 mg at 03/29/21 1802 .  sodium bicarbonate tablet 650 mg, 650 mg, Oral, BID, Elgergawy, Silver Huguenin, MD, 650 mg at 03/30/21 0814 .  torsemide (DEMADEX) tablet 20 mg, 20 mg, Oral, Daily, Emokpae, Courage, MD, 20 mg at 03/30/21 1335 .  vitamin B-12 (CYANOCOBALAMIN) tablet 1,000 mcg, 1,000 mcg, Oral, Daily, Elgergawy, Silver Huguenin, MD, 1,000 mcg at 03/30/21 0813 .  zinc sulfate capsule 220 mg, 220 mg, Oral, Daily, Emokpae, Courage, MD, 220 mg at 03/30/21 1335   Objective: Blood pressure (!) 123/55, pulse 68, temperature 98.6 F (37 C), temperature source Oral, resp. rate 17, height '5\' 7"'  (1.702 m), weight 74.8 kg, SpO2 99 %. Patient is alert and in no acute distress. Conjunctiva is pink. Sclera is nonicteric Oropharyngeal mucosa is normal. She has 4 teeth in lower jaw in very poor condition and she also has broken teeth and upper lower jaw. No neck masses or thyromegaly noted. Cardiac exam with regular rhythm normal S1 and S2. No murmur or gallop noted. Lungs are clear to auscultation. Abdomen is full.  She has abdominal wall  edema.  Abdomen is soft with mild tenderness at LLQ.  No organomegaly or masses. She has left AKA and right BKA.  She has mild edema to both thighs as well.  Patient has Foley's catheter and rectal tube in place.  Labs/studies Results:  CBC Latest Ref Rng & Units 03/30/2021 03/29/2021 03/28/2021  WBC 4.0 - 10.5 K/uL 6.2 5.3 5.7  Hemoglobin 12.0 - 15.0 g/dL 8.5(L) 9.2(L) 9.4(L)  Hematocrit 36.0 - 46.0 % 28.6(L) 30.6(L) 30.3(L)  Platelets 150 - 400 K/uL 144(L) 148(L) 172    CMP Latest Ref Rng &  Units 03/30/2021 03/29/2021 03/28/2021  Glucose 70 - 99 mg/dL 88 94 85  BUN 6 - 20 mg/dL '11 6 7  ' Creatinine 0.44 - 1.00 mg/dL 1.27(H) 1.24(H) 1.23(H)  Sodium 135 - 145 mmol/L 133(L) 136 137  Potassium 3.5 - 5.1 mmol/L 3.7 3.6 2.7(LL)  Chloride 98 - 111 mmol/L 104 109 107  CO2 22 - 32 mmol/L '24 22 24  ' Calcium 8.9 - 10.3 mg/dL 6.6(L) 6.6(L) 6.6(L)  Total Protein 6.5 - 8.1 g/dL - 5.3(L) 5.3(L)  Total Bilirubin 0.3 - 1.2 mg/dL - 0.4 0.5  Alkaline Phos 38 - 126 U/L - 91 92  AST 15 - 41 U/L - 10(L) 11(L)  ALT 0 - 44 U/L - 10 11    Hepatic Function Latest Ref Rng & Units 03/30/2021 03/29/2021 03/28/2021  Total Protein 6.5 - 8.1 g/dL - 5.3(L) 5.3(L)  Albumin 3.5 - 5.0 g/dL 1.8(L) 2.0(L) 2.1(L)  AST 15 - 41 U/L - 10(L) 11(L)  ALT 0 - 44 U/L - 10 11  Alk Phosphatase 38 - 126 U/L - 91 92  Total Bilirubin 0.3 - 1.2 mg/dL - 0.4 0.5  Bilirubin, Direct 0.0 - 0.2 mg/dL - - -    Lab Results  Component Value Date   CRP 1.3 (H) 03/28/2021    C. difficile antigen and toxin are negative. GI pathogen panel is negative. Fecal lactoferrin pending Fecal pancreatic elastase pending Stool osmolality pending  24-hour stool collection for electrolytes fecal fat underway. 24-hour urine for 5 HIAA underway.  Assessment:  #1.  Chronic diarrhea.  Diarrhea is incapacitating and impacting quality of her life.  It is also resulting in significant disability i.e. dehydration and electrolyte abnormalities.  Hypokalemia has improved with therapy.  Work-up during recent hospitalization ruled out infection celiac disease or microscopic colitis. She has mildly elevated CRP which may be due to COVID infection. She is undergoing 24-hour stool collection for multiple studies as well as 24-hour urine collection for 5 HIAA. We will start her on diphenoxylate and low-dose octreotide as soon as stool collection is completed.  #2.  COVID infection.  She tested positive on admission.  She is on remdesivir.  #3.  Anemia appears  to be due to chronic disease.  #4.  Anasarca.  She has significant edema to abdominal wall colon as well as pleural effusion and small amount of ascites.  Fluid third spacing most likely due to hypoalbuminemia.  #5.  Nausea and vomiting.  She may have an element of gastroparesis.  Would monitor her symptoms before considering low-dose promotility agent.  #6.  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 I have reviewed current study with Dr. Lavonia Dana and compared with prior studies.  She had CT angio abdomen back in April 2019 when these lesions were seen.  He is therefore not concerned about neoplastic process as they have not changed.  Therefore no further work-up at this time.  #7.  Dental  disease.  She has 4 teeth and lower jaw and multiple broken/rudimentary teeth.  She should have this fixed as soon as possible.  #8.  Atrial fibrillation.  Patient is anticoagulated.  #9.  Chronic kidney disease.  Renal function appears to be stable.  Plan:  Begin diphenoxylate 1 tablet before each meal starting this evening when stool collection completed. Octreotide 50 mcg subcu every 12 hours Accu-Chek twice daily for the first few days while on octreotide.

## 2021-03-30 NOTE — Plan of Care (Addendum)
  Problem: Education: Goal: Knowledge of General Education information will improve Description: Including pain rating scale, medication(s)/side effects and non-pharmacologic comfort measures Outcome: Progressing   Problem: Clinical Measurements: Goal: Ability to maintain clinical measurements within normal limits will improve Outcome: Progressing Goal: Respiratory complications will improve Outcome: Progressing   Problem: Activity: Goal: Risk for activity intolerance will decrease Outcome: Progressing  This writer has been in the patients room 21 times since beginning of shift. Pt has complained about rectal tube and writer has repositioned tube 3 times, inflated and deflated it for comfort 6 times, given tylenol, applied barrier cream each of the times in room. Pt continuously calls out for assistance. Pt educated about skin integrity this shift numerous times and has been repositioned numerous times. Pt has not slept but 2 hours this shift. This Probation officer has educated patient about necessary collection of stool and urine to get to the bottom of her disease state and admission. Pt states she urinated only 2 to 3 times daily with her kidney disease. Refuses to be catheterized. States she will urinate when she is ready usually around 7 to 8am every morning. Purewick replaced 3 times as patient has thrashed in bed and refuses to leave it in. Buttocks are red and raw from chronic diarrhea. MD Olevia Bowens secured message for something stronger for pain as patient has been tearful with anxiety over her bottom this shift. Tylenol not due at this time. Awaiting reply.  Bed alarm on, callbell within reach.   Update: MD ordered dilaudid x 1, compazine and nystop powder. Education given to patient with medication and deep breathing taught to patient with education and teach back with demonstration at this time. Pt turned self to the left side at this time and appears calm as she was screaming in pain prior to  administration.

## 2021-03-30 NOTE — Care Management Important Message (Signed)
Important Message  Patient Details  Name: [name] MRN: [id number] Date of Birth: 04/29/1966   Medicare Important Message Given:  Yes     [name] 03/30/2021, 12:59 PM

## 2021-03-30 NOTE — Progress Notes (Signed)
[facility] night shift.  The nursing staff reported that the patient is having severe perirectal area pain due to multiple episodes of diarrhea and discomfort from rectal tube. Hydromorphone 1mg  IVP x1, compazine 5 mg IVP x1 dose and nystatin powder ordered.  Tennis Must, MD

## 2021-03-31 DIAGNOSIS — I5042 Chronic combined systolic (congestive) and diastolic (congestive) heart failure: Secondary | ICD-10-CM | POA: Diagnosis not present

## 2021-03-31 DIAGNOSIS — U071 COVID-19: Secondary | ICD-10-CM | POA: Diagnosis not present

## 2021-03-31 DIAGNOSIS — R197 Diarrhea, unspecified: Secondary | ICD-10-CM | POA: Diagnosis not present

## 2021-03-31 DIAGNOSIS — E1169 Type 2 diabetes mellitus with other specified complication: Secondary | ICD-10-CM

## 2021-03-31 DIAGNOSIS — I2510 Atherosclerotic heart disease of native coronary artery without angina pectoris: Secondary | ICD-10-CM

## 2021-03-31 DIAGNOSIS — I4891 Unspecified atrial fibrillation: Secondary | ICD-10-CM | POA: Diagnosis not present

## 2021-03-31 DIAGNOSIS — L89313 Pressure ulcer of right buttock, stage 3: Secondary | ICD-10-CM | POA: Diagnosis not present

## 2021-03-31 DIAGNOSIS — E871 Hypo-osmolality and hyponatremia: Secondary | ICD-10-CM | POA: Diagnosis not present

## 2021-03-31 DIAGNOSIS — K529 Noninfective gastroenteritis and colitis, unspecified: Secondary | ICD-10-CM | POA: Diagnosis not present

## 2021-03-31 DIAGNOSIS — E1142 Type 2 diabetes mellitus with diabetic polyneuropathy: Secondary | ICD-10-CM

## 2021-03-31 DIAGNOSIS — Z89612 Acquired absence of left leg above knee: Secondary | ICD-10-CM

## 2021-03-31 DIAGNOSIS — R338 Other retention of urine: Secondary | ICD-10-CM | POA: Diagnosis not present

## 2021-03-31 DIAGNOSIS — Z89611 Acquired absence of right leg above knee: Secondary | ICD-10-CM

## 2021-03-31 DIAGNOSIS — E785 Hyperlipidemia, unspecified: Secondary | ICD-10-CM

## 2021-03-31 LAB — FERRITIN: Ferritin: 101 ng/mL (ref 11–307)

## 2021-03-31 LAB — COMPREHENSIVE METABOLIC PANEL
ALT: 8 U/L (ref 0–44)
AST: 9 U/L — ABNORMAL LOW (ref 15–41)
Albumin: 2.1 g/dL — ABNORMAL LOW (ref 3.5–5.0)
Alkaline Phosphatase: 99 U/L (ref 38–126)
Anion gap: 7 (ref 5–15)
BUN: 14 mg/dL (ref 6–20)
CO2: 21 mmol/L — ABNORMAL LOW (ref 22–32)
Calcium: 7.1 mg/dL — ABNORMAL LOW (ref 8.9–10.3)
Chloride: 108 mmol/L (ref 98–111)
Creatinine, Ser: 1.41 mg/dL — ABNORMAL HIGH (ref 0.44–1.00)
GFR, Estimated: 44 mL/min — ABNORMAL LOW (ref >60)
Glucose, Bld: 103 mg/dL — ABNORMAL HIGH (ref 70–99)
Potassium: 3.9 mmol/L (ref 3.5–5.1)
Sodium: 136 mmol/L (ref 135–145)
Total Bilirubin: 0.5 mg/dL (ref 0.3–1.2)
Total Protein: 5.6 g/dL — ABNORMAL LOW (ref 6.5–8.1)

## 2021-03-31 LAB — CBC WITH DIFFERENTIAL/PLATELET
Abs Immature Granulocytes: 0.03 10*3/uL (ref 0.00–0.07)
Basophils Absolute: 0 10*3/uL (ref 0.0–0.1)
Basophils Relative: 0 %
Eosinophils Absolute: 0.1 10*3/uL (ref 0.0–0.5)
Eosinophils Relative: 1 %
HCT: 34.7 % — ABNORMAL LOW (ref 36.0–46.0)
Hemoglobin: 10.4 g/dL — ABNORMAL LOW (ref 12.0–15.0)
Immature Granulocytes: 0 %
Lymphocytes Relative: 13 %
Lymphs Abs: 0.9 10*3/uL (ref 0.7–4.0)
MCH: 25.6 pg — ABNORMAL LOW (ref 26.0–34.0)
MCHC: 30 g/dL (ref 30.0–36.0)
MCV: 85.5 fL (ref 80.0–100.0)
Monocytes Absolute: 0.4 10*3/uL (ref 0.1–1.0)
Monocytes Relative: 6 %
Neutro Abs: 5.7 10*3/uL (ref 1.7–7.7)
Neutrophils Relative %: 80 %
Platelets: 185 10*3/uL (ref 150–400)
RBC: 4.06 MIL/uL (ref 3.87–5.11)
RDW: 21 % — ABNORMAL HIGH (ref 11.5–15.5)
WBC: 7.2 10*3/uL (ref 4.0–10.5)
nRBC: 0 % (ref 0.0–0.2)

## 2021-03-31 LAB — GLUCOSE, CAPILLARY
Glucose-Capillary: 135 mg/dL — ABNORMAL HIGH (ref 70–99)
Glucose-Capillary: 120 mg/dL — ABNORMAL HIGH (ref 70–99)
Glucose-Capillary: 179 mg/dL — ABNORMAL HIGH (ref 70–99)
Glucose-Capillary: 123 mg/dL — ABNORMAL HIGH (ref 70–99)

## 2021-03-31 LAB — PHOSPHORUS: Phosphorus: 3.1 mg/dL (ref 2.5–4.6)

## 2021-03-31 LAB — C-REACTIVE PROTEIN: CRP: 3.3 mg/dL — ABNORMAL HIGH (ref <1.0)

## 2021-03-31 LAB — MAGNESIUM: Magnesium: 1.7 mg/dL (ref 1.7–2.4)

## 2021-03-31 LAB — D-DIMER, QUANTITATIVE: D-Dimer, Quant: 2.9 ug/mL-FEU — ABNORMAL HIGH (ref 0.00–0.50)

## 2021-03-31 MED ORDER — RISAQUAD PO CAPS
1.0000 | ORAL_CAPSULE | Freq: Three times a day (TID) | ORAL | Status: DC
  Administered 2021-03-31 – 2021-04-02 (×7): 1 via ORAL
  Filled 2021-03-31 (×7): qty 1

## 2021-03-31 MED ORDER — TORSEMIDE 20 MG PO TABS
20.0000 mg | ORAL_TABLET | Freq: Every day | ORAL | Status: DC
  Administered 2021-04-01: 20 mg via ORAL
  Filled 2021-03-31 (×2): qty 1

## 2021-03-31 MED ORDER — GERHARDT'S BUTT CREAM
TOPICAL_CREAM | Freq: Four times a day (QID) | CUTANEOUS | Status: DC
  Administered 2021-03-31 – 2021-04-02 (×2): 1 via TOPICAL
  Filled 2021-03-31: qty 1

## 2021-03-31 NOTE — Progress Notes (Signed)
Subjective:  Patient says she feels much better today.  She states she has great appetite.  She is eating everything on her tray and eating snacks in between.  Current Medications:  Current Facility-Administered Medications:  .  0.9 %  sodium chloride infusion, , Intravenous, Continuous, Emokpae, Courage, MD, Last Rate: 20 mL/hr at 03/31/21 0603, New Bag at 03/31/21 0603 .  acetaminophen (TYLENOL) tablet 650 mg, 650 mg, Oral, Q6H PRN, Reubin Milan, MD, 650 mg at 03/30/21 1951 .  acidophilus (RISAQUAD) capsule 1 capsule, 1 capsule, Oral, TID WC, Shahmehdi, Seyed A, MD .  amiodarone (PACERONE) tablet 200 mg, 200 mg, Oral, Daily, Emokpae, Courage, MD, 200 mg at 03/31/21 0851 .  ascorbic acid (VITAMIN C) tablet 500 mg, 500 mg, Oral, Daily, Emokpae, Courage, MD, 500 mg at 03/31/21 0853 .  Chlorhexidine Gluconate Cloth 2 % PADS 6 each, 6 each, Topical, Daily, Emokpae, Courage, MD, 6 each at 03/30/21 1612 .  dicyclomine (BENTYL) capsule 10 mg, 10 mg, Oral, Q6H PRN, Elgergawy, Silver Huguenin, MD .  diphenoxylate-atropine (LOMOTIL) 2.5-0.025 MG per tablet 1 tablet, 1 tablet, Oral, TID, Abygale Karpf, Mechele Dawley, MD, 1 tablet at 03/31/21 0852 .  Gerhardt's butt cream, , Topical, QID, Shahmehdi, Seyed A, MD .  guaiFENesin-dextromethorphan (ROBITUSSIN DM) 100-10 MG/5ML syrup 10 mL, 10 mL, Oral, Q4H PRN, Reubin Milan, MD .  insulin aspart (novoLOG) injection 0-15 Units, 0-15 Units, Subcutaneous, TID WC, Elgergawy, Silver Huguenin, MD, 2 Units at 03/31/21 0857 .  insulin aspart (novoLOG) injection 0-5 Units, 0-5 Units, Subcutaneous, QHS, Elgergawy, Dawood S, MD .  metoprolol tartrate (LOPRESSOR) tablet 12.5 mg, 12.5 mg, Oral, BID, Elgergawy, Silver Huguenin, MD, 12.5 mg at 03/31/21 0852 .  multivitamin with minerals tablet 1 tablet, 1 tablet, Oral, Daily, Emokpae, Courage, MD, 1 tablet at 03/31/21 0853 .  nicotine (NICODERM CQ - dosed in mg/24 hours) patch 21 mg, 21 mg, Transdermal, Daily, Elgergawy, Silver Huguenin, MD, 21 mg  at 03/31/21 0851 .  nystatin (MYCOSTATIN/NYSTOP) topical powder, , Topical, TID, Reubin Milan, MD, Given at 03/31/21 0932 .  octreotide (SANDOSTATIN) injection 50 mcg, 50 mcg, Subcutaneous, Q12H, Damontre Millea U, MD, 50 mcg at 03/31/21 0506 .  pantoprazole (PROTONIX) EC tablet 40 mg, 40 mg, Oral, Daily, Elgergawy, Silver Huguenin, MD, 40 mg at 03/31/21 0853 .  pregabalin (LYRICA) capsule 100 mg, 100 mg, Oral, BID, Elgergawy, Silver Huguenin, MD, 100 mg at 03/31/21 0853 .  remdesivir 100 mg in sodium chloride 0.9 % 100 mL IVPB, 100 mg, Intravenous, Daily, Emokpae, Courage, MD, Last Rate: 200 mL/hr at 03/31/21 0931, 100 mg at 03/31/21 0931 .  Rivaroxaban (XARELTO) tablet 15 mg, 15 mg, Oral, Q supper, Elgergawy, Silver Huguenin, MD, 15 mg at 03/30/21 1800 .  rosuvastatin (CRESTOR) tablet 20 mg, 20 mg, Oral, q1800, Elgergawy, Silver Huguenin, MD, 20 mg at 03/30/21 1801 .  sodium bicarbonate tablet 650 mg, 650 mg, Oral, BID, Elgergawy, Silver Huguenin, MD, 650 mg at 03/31/21 0927 .  [START ON 04/01/2021] torsemide (DEMADEX) tablet 20 mg, 20 mg, Oral, Daily, Shahmehdi, Seyed A, MD .  vitamin B-12 (CYANOCOBALAMIN) tablet 1,000 mcg, 1,000 mcg, Oral, Daily, Elgergawy, Silver Huguenin, MD, 1,000 mcg at 03/31/21 0852 .  zinc sulfate capsule 220 mg, 220 mg, Oral, Daily, Emokpae, Courage, MD, 220 mg at 03/31/21 0852   Objective: Blood pressure 129/61, pulse 65, temperature 98.7 F (37.1 C), temperature source Oral, resp. rate 16, height '5\' 7"'  (1.702 m), weight 74.8 kg, SpO2 94 %. Patient is alert  and in no acute distress. Abdomen is full.  She has abdominal wall edema.  Abdomen is full but soft and nontender with organomegaly or masses. Edema to right thigh has resolved.  She remains with small amount of pitting edema proximal to left AKA.    Labs/studies Results:   CBC Latest Ref Rng & Units 03/31/2021 03/30/2021 03/29/2021  WBC 4.0 - 10.5 K/uL 7.2 6.2 5.3  Hemoglobin 12.0 - 15.0 g/dL 10.4(L) 8.5(L) 9.2(L)  Hematocrit 36.0 - 46.0 %  34.7(L) 28.6(L) 30.6(L)  Platelets 150 - 400 K/uL 185 144(L) 148(L)    CMP Latest Ref Rng & Units 03/31/2021 03/30/2021 03/29/2021  Glucose 70 - 99 mg/dL 103(H) 88 94  BUN 6 - 20 mg/dL '14 11 6  ' Creatinine 0.44 - 1.00 mg/dL 1.41(H) 1.27(H) 1.24(H)  Sodium 135 - 145 mmol/L 136 133(L) 136  Potassium 3.5 - 5.1 mmol/L 3.9 3.7 3.6  Chloride 98 - 111 mmol/L 108 104 109  CO2 22 - 32 mmol/L 21(L) 24 22  Calcium 8.9 - 10.3 mg/dL 7.1(L) 6.6(L) 6.6(L)  Total Protein 6.5 - 8.1 g/dL 5.6(L) - 5.3(L)  Total Bilirubin 0.3 - 1.2 mg/dL 0.5 - 0.4  Alkaline Phos 38 - 126 U/L 99 - 91  AST 15 - 41 U/L 9(L) - 10(L)  ALT 0 - 44 U/L 8 - 10    Hepatic Function Latest Ref Rng & Units 03/31/2021 03/30/2021 03/29/2021  Total Protein 6.5 - 8.1 g/dL 5.6(L) - 5.3(L)  Albumin 3.5 - 5.0 g/dL 2.1(L) 1.8(L) 2.0(L)  AST 15 - 41 U/L 9(L) - 10(L)  ALT 0 - 44 U/L 8 - 10  Alk Phosphatase 38 - 126 U/L 99 - 91  Total Bilirubin 0.3 - 1.2 mg/dL 0.5 - 0.4  Bilirubin, Direct 0.0 - 0.2 mg/dL - - -    Lab Results  Component Value Date   CRP 3.3 (H) 03/31/2021    C. difficile antigen and toxin are negative. GI pathogen panel is negative. Fecal lactoferrin pending Fecal pancreatic elastase pending Stool osmolality pending  24-hour stool collection for electrolytes fecal fat completed. 24-hour urine for 5 HIAA completed  Assessment:  #1.  Chronic diarrhea.  Patient with debilitating diarrhea resulting in severe mall nutrition and anasarca.  Stool studies been negative including duodenal and colonic biopsies.  Now she is being evaluated for secretory and osmotic diarrhea.  24-hour stool collection was completed along with 24-hour urine collection for HIAA. Patient begun on low-dose subcu octreotide and diphenoxylate yesterday.  #2.  COVID infection.  She tested positive on admission.  She had low-grade fever yesterday but no symptoms today.  She is on remdesivir.  #3.  Anemia appears to be due to chronic disease.  H&H is coming  up.  No evidence of GI bleed.  #4.  Anasarca.  She has significant edema to abdominal wall colon as well as pleural effusion and small amount of ascites.  Fluid third spacing most likely due to hypoalbuminemia.  #5.  Nausea and vomiting.  She has not had any nausea and vomiting in the last 24 hours.  #6.  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 this abnormality appears to be old and therefore of no concern.  I did review prior studies with Dr. Thornton Papas as summarized in my note from yesterday.  Plan:  Wait for results of pending stool studies. Continue subcu octreotide and diphenoxylate. Will remove Foley's catheter and rectal tube which patient wants out. Patient will be reevaluated in a.m. Will check serum albumin on 04/02/2021.

## 2021-03-31 NOTE — Progress Notes (Signed)
Patient Demographics:    [name], is a 55 y.o. female, DOB - 02/29/1966, KXF:[id number]  Admit date - 03/28/2021   Admitting Physician Courage Denton Brick, MD  Outpatient Primary MD for the patient is Adamo, [name], MD  LOS - 2  Chief Complaint  Patient presents with  . Diarrhea        Subjective:    [name] was seen and examined this morning, husband present at bedside.  Patient is very adamant that the rectal tube needs to be discontinued.  Hemodynamically stable, watery diarrhea has improved pasty stool now Foley catheter still in place--for urinary retention, explained to the patient and needs to be discontinued for voiding trial today.    Assessment  & Plan :    Principal Problem:   Diarrhea Active Problems:   COVID-19 virus infection   Hypokalemia due to excessive gastrointestinal loss of potassium   Hypomagnesemia   Acute urinary retention -CKD (chronic kidney disease) stage 3A, GFR 30-59 ml/min    Atrial fibrillation with rapid ventricular response (HCC)   Diabetic peripheral neuropathy (HCC)   Chronic combined systolic (congestive) and diastolic (congestive) heart failure (HCC)   Coronary artery disease involving native coronary artery of native heart without angina pectoris   Hyperlipidemia associated with type 2 diabetes mellitus ([place])  Brief Summary:- 55 y.o. female, with type 2 diabetes mellitus, lipidemia, stage IV CKD, hypertension, ischemic cardiomyopathy with CAD, GERD, history of non-STEMI, paroxysmal A. fib, bilateral amputee, with known history of persistent diarrhea readmitted with worsening diarrhea and electrolyte derangement  ------------------------------------------------------------------------------------------------------------------------  Assessment and plan  1)Diarrhea--chronic but worse lately GI consult appreciated--recommended  Fecal lactoferrin,  Fecal pancreatic elastase, Stool osmolality --which has been collected  S/p 24-hour stool collection for qualitative fecal fat analysis and electrolytes.  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 We will restart in a.m.  7)Controlled type II  diabetes -A1c is 5.7  Use Novolog/Humalog Sliding scale insulin with Accu-Cheks/Fingersticks as ordered  8)CKD stageIIIA -renal function at baseline, -- renally adjust medications, avoid nephrotoxic agents / dehydration  / hypotension -Monitoring renal function mildly elevated BUN 14, creatinine 1.24, 1.27  >> 1.41 today We will hold diuretics today  9)Peripheral vascular disease with bilateral above-knee amputations -Continue with statin and Xarelto  10)diabetic neuropathy -Continue with Lyrica, home amitriptyline given electrolyte abnormalities  11)Tobacco abuse continue with nicotine patch  12)Covid 19 infection--- patient is vaccinated but not boosted Trend and follow inflammatory markers, continue IV remdesivir for 3 days only Stable no respiratory compromise or  cough  13)Acute  Urinary Retention---Foley catheter was placed on this admission, will be discontinued today 03/31/2021, voiding trial     Disposition/Need for in-Hospital Stay- patient unable to be discharged at this time due to -significant profuse frequent loose stools with significant life-threatening electrolyte abnormalities requiring IV replacements*  Status is: Inpatient  Remains inpatient appropriate because:Please see disposition above   Disposition: The patient is from: Home              Anticipated d/c is to: Home              Anticipated d/c date is: 2 days              Patient currently is not medically stable to d/c. Barriers: Not Clinically Stable-   Code Status : -  Code Status: Full Code   Family Communication:   NA (patient is alert, awake and coherent) husband at bedside  Consults  :  Gi  DVT Prophylaxis  :   - SCDs   Rivaroxaban (XARELTO) tablet 15 mg    Lab Results  Component Value Date   PLT 185 03/31/2021    Inpatient Medications  Scheduled Meds: . acidophilus  1 capsule Oral TID WC  . amiodarone  200 mg Oral Daily  . vitamin C  500 mg Oral Daily  . Chlorhexidine Gluconate Cloth  6 each Topical Daily  . diphenoxylate-atropine  1 tablet Oral TID  . Gerhardt's butt cream   Topical QID  . insulin aspart  0-15 Units Subcutaneous TID WC  . insulin aspart  0-5 Units Subcutaneous QHS  . metoprolol tartrate  12.5 mg Oral BID  . multivitamin with minerals  1 tablet Oral Daily  . nicotine  21 mg Transdermal Daily  . nystatin   Topical TID  . octreotide  50 mcg Subcutaneous Q12H  . pantoprazole  40 mg Oral Daily  . pregabalin  100 mg Oral BID  . Rivaroxaban  15 mg Oral Q supper  . rosuvastatin  20 mg Oral q1800  . sodium bicarbonate  650 mg Oral BID  . [START ON 04/01/2021] torsemide  20 mg Oral Daily  . cyanocobalamin  1,000 mcg Oral Daily  . zinc sulfate  220 mg Oral Daily   Continuous Infusions: . sodium chloride 20 mL/hr at 03/31/21 0603  .  remdesivir 100 mg in NS 100 mL 100 mg (03/31/21 0931)   PRN Meds:.acetaminophen, dicyclomine, guaiFENesin-dextromethorphan    Anti-infectives (From admission, onward)   Start     Dose/Rate Route Frequency Ordered Stop   03/31/21 1000  remdesivir 100 mg in sodium chloride 0.9 % 100 mL IVPB  Status:  Discontinued       "Followed by" Linked Group Details   100 mg 200 mL/hr over 30 Minutes Intravenous Daily 03/30/21 1210 03/30/21 1220   03/31/21 1000  remdesivir 100 mg in sodium chloride 0.9 % 100 mL IVPB        100 mg 200 mL/hr over 30 Minutes Intravenous Daily 03/30/21 1226 04/04/21 0959   03/30/21 1300  remdesivir 200 mg in sodium chloride 0.9% 250 mL IVPB  Status:  Discontinued       "Followed by" Linked Group Details   200 mg 580 mL/hr over 30 Minutes Intravenous Once 03/30/21 1210 03/30/21 1220   03/30/21 1300  remdesivir 100 mg in sodium chloride 0.9 % 100 mL IVPB        100 mg 200 mL/hr over 30 Minutes Intravenous Every 30 min 03/30/21 1219 03/30/21 1401  03/29/21 1000  remdesivir 100 mg in sodium chloride 0.9 % 100 mL IVPB  Status:  Discontinued       "Followed by" Linked Group Details   100 mg 200 mL/hr over 30 Minutes Intravenous Daily 03/28/21 2132 03/28/21 2134   03/29/21 1000  remdesivir 100 mg in sodium chloride 0.9 % 100 mL IVPB        100 mg 200 mL/hr over 30 Minutes Intravenous Daily 03/28/21 2136 03/30/21 1222   03/28/21 2206  remdesivir 100 mg in sodium chloride 0.9 % 100 mL IVPB       "Followed by" Linked Group Details   100 mg 200 mL/hr over 30 Minutes Intravenous  Once 03/28/21 2137 03/29/21 0108   03/28/21 2145  remdesivir 100 mg in sodium chloride 0.9 % 100 mL IVPB       "Followed by" Linked Group Details   100 mg 200 mL/hr over 30 Minutes Intravenous  Once 03/28/21 2137 03/29/21 0107   03/28/21 2130  remdesivir 200 mg in sodium chloride 0.9% 250 mL IVPB  Status:  Discontinued       "Followed by" Linked Group Details   200 mg 580 mL/hr over 30 Minutes  Intravenous Once 03/28/21 2132 03/28/21 2134        Objective:   Vitals:   03/30/21 2133 03/30/21 2305 03/31/21 0456 03/31/21 0852  BP:   (!) 103/54 129/61  Pulse:   (!) 59 65  Resp:   16   Temp: 99.6 F (37.6 C) 98.2 F (36.8 C) 98.7 F (37.1 C)   TempSrc: Oral Oral Oral   SpO2:   94%   Weight:      Height:        Wt Readings from Last 3 Encounters:  03/28/21 74.8 kg  03/08/21 101.2 kg  12/05/20 101.2 kg     Intake/Output Summary (Last 24 hours) at 03/31/2021 1202 Last data filed at 03/31/2021 0900 Gross per 24 hour  Intake 1486.92 ml  Output 1750 ml  Net -263.08 ml    Physical Exam:   General:  Alert, oriented, cooperative, no distress;   HEENT:  Normocephalic, PERRL, otherwise with in Normal limits   Neuro:  CNII-XII intact. , normal motor and sensation, reflexes intact   Lungs:   Clear to auscultation BL, Respirations unlabored, no wheezes / crackles  Cardio:    S1/S2, RRR, No murmure, No Rubs or Gallops   Abdomen:   Soft, non-tender, bowel sounds active all four quadrants,  no guarding or peritoneal signs.  Muscular skeletal:   Left AKA, right BKA,  Limited exam - in bed, able to move all 4 extremities, Normal strength,  2+ pulses,  symmetric, No pitting edema  Skin:  Dry, warm to touch, negative for any Rashes,  Wounds: Please see nursing documentation Pressure Injury Stage III -  Full thickness tissue loss. Subcutaneous fat may be visible but bone, tendon or muscle are NOT exposed. (Active)     Location: Ischial tuberosity  Location Orientation: Right  Staging: Stage III -  Full thickness tissue loss. Subcutaneous fat may be visible but bone, tendon or muscle are NOT exposed.  Wound Description (Comments):   Present on Admission: Yes    GU-Foley catheter in situ Rectal--Flexi-Seal rectal tube in situ   Data Review:   Micro Results Recent Results (from the past 240 hour(s))  Resp Panel by RT-PCR (Flu A&B, Covid) Nasopharyngeal Swab     Status:  Abnormal   Collection Time: 03/28/21  7:22 PM  Specimen: Nasopharyngeal Swab; Nasopharyngeal(NP) swabs in vial transport medium  Result Value Ref Range Status   SARS Coronavirus 2 by RT PCR POSITIVE (A) NEGATIVE Final    Comment: RESULT CALLED TO, READ BACK BY AND VERIFIED WITH: WATLINGTON,K RN @2114  03/28/21 BILLINGSLEY,L (NOTE) SARS-CoV-2 target nucleic acids are DETECTED.  The SARS-CoV-2 RNA is generally detectable in upper respiratory specimens during the acute phase of infection. Positive results are indicative of the presence of the identified virus, but do not rule out bacterial infection or co-infection with other pathogens not detected by the test. Clinical correlation with patient history and other diagnostic information is necessary to determine patient infection status. The expected result is Negative.  Fact Sheet for Patients: EntrepreneurPulse.com.au  Fact Sheet for Healthcare Providers: IncredibleEmployment.be  This test is not yet approved or cleared by the [place] FDA and  has been authorized for detection and/or diagnosis of SARS-CoV-2 by FDA under an Emergency Use Authorization (EUA).  This EUA will remain in effect (meaning this t est can be used) for the duration of  the COVID-19 declaration under Section 564(b)(1) of the Act, 21 U.S.C. section 360bbb-3(b)(1), unless the authorization is terminated or revoked sooner.     Influenza A by PCR NEGATIVE NEGATIVE Final   Influenza B by PCR NEGATIVE NEGATIVE Final    Comment: (NOTE) The Xpert Xpress SARS-CoV-2/FLU/RSV plus assay is intended as an aid in the diagnosis of influenza from Nasopharyngeal swab specimens and should not be used as a sole basis for treatment. Nasal washings and aspirates are unacceptable for Xpert Xpress SARS-CoV-2/FLU/RSV testing.  Fact Sheet for Patients: EntrepreneurPulse.com.au  Fact Sheet for Healthcare  Providers: IncredibleEmployment.be  This test is not yet approved or cleared by the [place] FDA and has been authorized for detection and/or diagnosis of SARS-CoV-2 by FDA under an Emergency Use Authorization (EUA). This EUA will remain in effect (meaning this test can be used) for the duration of the COVID-19 declaration under Section 564(b)(1) of the Act, 21 U.S.C. section 360bbb-3(b)(1), unless the authorization is terminated or revoked.  Performed at [facility], [address], [place], [place] [address]   C Difficile Quick Screen w PCR reflex     Status: None   Collection Time: 03/29/21  8:46 AM   Specimen: STOOL  Result Value Ref Range Status   C Diff antigen NEGATIVE NEGATIVE Final   C Diff toxin NEGATIVE NEGATIVE Final   C Diff interpretation No C. difficile detected.  Final    Comment: NEGATIVE Performed at [facility], [address], [place], [place] [address]   Gastrointestinal Panel by PCR , Stool     Status: None   Collection Time: 03/29/21  8:46 AM   Specimen: STOOL  Result Value Ref Range Status   Campylobacter species NOT DETECTED NOT DETECTED Final   Plesimonas shigelloides NOT DETECTED NOT DETECTED Final   Salmonella species NOT DETECTED NOT DETECTED Final   Yersinia enterocolitica NOT DETECTED NOT DETECTED Final   Vibrio species NOT DETECTED NOT DETECTED Final   Vibrio cholerae NOT DETECTED NOT DETECTED Final   Enteroaggregative E coli (EAEC) NOT DETECTED NOT DETECTED Final   Enteropathogenic E coli (EPEC) NOT DETECTED NOT DETECTED Final   Enterotoxigenic E coli (ETEC) NOT DETECTED NOT DETECTED Final   Shiga like toxin producing E coli (STEC) NOT DETECTED NOT DETECTED Final   Shigella/Enteroinvasive E coli (EIEC) NOT DETECTED NOT DETECTED Final   Cryptosporidium NOT DETECTED NOT DETECTED Final   Cyclospora cayetanensis NOT DETECTED NOT  DETECTED Final   Entamoeba histolytica NOT DETECTED NOT DETECTED Final   Giardia lamblia NOT  DETECTED NOT DETECTED Final   Adenovirus F40/41 NOT DETECTED NOT DETECTED Final   Astrovirus NOT DETECTED NOT DETECTED Final   Norovirus GI/GII NOT DETECTED NOT DETECTED Final   Rotavirus A NOT DETECTED NOT DETECTED Final   Sapovirus (I, II, IV, and V) NOT DETECTED NOT DETECTED Final    Comment: Performed at [facility], [address] [place], [place] [address]    Radiology Reports CT ABDOMEN PELVIS W CONTRAST  Result Date: 03/29/2021 CLINICAL DATA:  Acute abdominal pain with continued diarrhea and occasional vomiting. EXAM: CT ABDOMEN AND PELVIS WITH CONTRAST TECHNIQUE: Multidetector CT imaging of the abdomen and pelvis was performed using the standard protocol following bolus administration of intravenous contrast. CONTRAST:  187mL OMNIPAQUE IOHEXOL 300 MG/ML  SOLN COMPARISON:  Most recent CT 11/30/2017 FINDINGS: Lower chest: Small bilateral pleural effusions, present on prior exam, minimally increased. Left basilar opacity is similar, likely scarring/chronic atelectasis. Heart is enlarged. Coronary artery calcifications. Small pericardial effusion. Hepatobiliary: Diffusely heterogeneous appearance of the liver. Slight nodular hepatic contours suggest cirrhosis. No obvious focal hepatic lesion allowing for phase of contrast. Post cholecystectomy. There is no biliary dilatation. Small volume perihepatic ascites. Pancreas: No ductal dilatation or inflammation. Spleen: Multiple rounded low-density lesions scattered throughout the spleen with lobulated borders, largest measuring 18 mm in the upper and lower spleen. Heterogeneous splenic enhancement likely related to phase of contrast. No splenomegaly. Adrenals/Urinary Tract: 11 mm left adrenal nodule. Normal right adrenal gland. No hydronephrosis. Bilateral perinephric edema, right greater than left. Mild thinning of bilateral renal parenchyma. No visualized renal or ureteral calculi. No focal renal abnormality. Urinary bladder is partially  distended, thick walled. Stomach/Bowel: Unremarkable stomach. No small bowel obstruction. No evidence of small bowel inflammation, mild small bowel thickening felt to be reactive secondary to ascites. Normal appendix. There is liquid stool in the ascending and proximal transverse colon. Equivocal wall thickening of the descending colon. No bowel pneumatosis. 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 Bifem bypass graft appears patent. Stent in the right common femoral artery which is patent. The left common femoral artery appears occluded. Patent portal vein. Patent mesenteric vasculature. No portal venous or mesenteric gas. No enlarged lymph nodes in the abdomen or pelvis. Reproductive: Normal for age uterine atrophy.  No adnexal mass. Other: Small to moderate volume abdominopelvic ascites. There is prominent subcutaneous body wall edema. No free air or focal fluid collection. Postsurgical change of the anterior abdominal wall. Musculoskeletal: Postsurgical change in the lower lumbar spine bones appear diffusely under mineralized. Probable bone graft donor site right iliac crest. IMPRESSION: 1. Liquid stool in the ascending and proximal transverse colon with equivocal wall thickening of the descending colon. Findings likely represent diarrheal illness in possible mild colitis of the descending colon. 2. Heterogeneous appearance of the liver with nodular hepatic contours, suggesting cirrhosis. 3. Small to moderate volume abdominopelvic ascites. Prominent subcutaneous body wall edema. Findings suggest third-spacing. 4. Bilateral perinephric edema, right greater than left, as well as bladder wall thickening. This may be due to underlying chronic renal disease, however possibility of urinary tract infection is raised. Recommend correlation with urinalysis. 5. Multiple rounded low-density lesions scattered throughout the spleen, largest measuring 18 mm  in the upper and lower spleen. These are nonspecific and may be cysts or hemangiomas. 6. Small bilateral pleural effusions, minimally increased from prior exam. 7. Indeterminate 11  mm left adrenal nodule. Aortic Atherosclerosis (ICD10-I70.0). Electronically Signed   By: Keith Rake M.D.   On: 03/29/2021 15:47   US RENAL  Result Date: 03/16/2021 CLINICAL DATA:  Acute kidney injury, abnormal labs today EXAM: RENAL / URINARY TRACT ULTRASOUND COMPLETE COMPARISON:  Renal ultrasound 10/10/2020 FINDINGS: Right Kidney: Renal measurements: 13.2 x 5.7 x 5.4 cm = volume: 213.6 mL. Echogenicity within normal limits. No mass or hydronephrosis visualized. Left Kidney: Renal measurements: 11.7 x 6.7 x 5.1 cm = volume: 209.1 mL. Limited visualization, however visualized portions demonstrate normal echogenicity without hydronephrosis. Bladder: Appears normal for degree of bladder distention. Bilateral ureteral jets are seen. Other: None. IMPRESSION: No hydronephrosis. Electronically Signed   By: Maurine Simmering   On: 03/16/2021 08:45     CBC Recent Labs  Lab 03/28/21 1420 03/29/21 0350 03/30/21 0611 03/31/21 0553  WBC 5.7 5.3 6.2 7.2  HGB 9.4* 9.2* 8.5* 10.4*  HCT 30.3* 30.6* 28.6* 34.7*  PLT 172 148* 144* 185  MCV 84.9 86.0 85.9 85.5  MCH 26.3 25.8* 25.5* 25.6*  MCHC 31.0 30.1 29.7* 30.0  RDW 20.5* 20.4* 20.6* 21.0*  LYMPHSABS 0.5*  --   --  0.9  MONOABS 0.3  --   --  0.4  EOSABS 0.1  --   --  0.1  BASOSABS 0.0  --   --  0.0    Chemistries  Recent Labs  Lab 03/28/21 1420 03/28/21 1700 03/29/21 0350 03/30/21 0611 03/31/21 0553  NA 137  --  136 133* 136  K 2.7*  --  3.6 3.7 3.9  CL 107  --  109 104 108  CO2 24  --  22 24 21*  GLUCOSE 85  --  94 88 103*  BUN 7  --  6 11 14   CREATININE 1.23*  --  1.24* 1.27* 1.41*  CALCIUM 6.6*  --  6.6* 6.6* 7.1*  MG  --  1.0* 1.8  --  1.7  AST 11*  --  10*  --  9*  ALT 11  --  10  --  8  ALKPHOS 92  --  91  --  99  BILITOT 0.5  --  0.4  --  0.5    ------------------------------------------------------------------------------------------------------------------ No results for input(s): CHOL, HDL, LDLCALC, TRIG, CHOLHDL, LDLDIRECT in the last 72 hours.  Lab Results  Component Value Date   HGBA1C 5.6 03/09/2021   ------------------------------------------------------------------------------------------------------------------ No results for input(s): TSH, T4TOTAL, T3FREE, THYROIDAB in the last 72 hours.  Invalid input(s): FREET3 ------------------------------------------------------------------------------------------------------------------ Recent Labs    03/31/21 0553  FERRITIN 101    Coagulation profile No results for input(s): INR, PROTIME in the last 168 hours.  Recent Labs    03/31/21 0553  DDIMER 2.90*    Cardiac Enzymes No results for input(s): CKMB, TROPONINI, MYOGLOBIN in the last 168 hours.  Invalid input(s): CK ------------------------------------------------------------------------------------------------------------------    Component Value Date/Time   BNP 2,905.0 (H) 12/01/2020 7902   Deatra James M.D on 03/31/2021 at 12:02 PM  Go to [email or web address] - for contact info  [name] - Office  [phone]

## 2021-03-31 NOTE — Progress Notes (Signed)
Patient demanded that rectal tube be removed.  Rectal tube was removed at 1210.  Patient still having very loose stools particularly after she eats.  Patient placed on bedpan and cleaned regularly throughout the shift.  Clear moisture barrier ointment and Gerhardtt's Butt cream were used to protect patient's skin.

## 2021-03-31 NOTE — Progress Notes (Addendum)
Peripheral IV is leaking at this time, notified MD Olevia Bowens via secure chat. Tried to save IV at this time. Day shift nurse states 3 RNs and ICU nurse tried [id number] without success. Talked with patient who states that she always has to have PICC line r/t poor venous access. Will report to day shift nurse.   03/31/21-Jeffery from ED able to place a 20 to right distal forearm at this time.

## 2021-03-31 NOTE — Progress Notes (Signed)
Incentive and flutter placed in room.

## 2021-04-01 DIAGNOSIS — I5042 Chronic combined systolic (congestive) and diastolic (congestive) heart failure: Secondary | ICD-10-CM | POA: Diagnosis not present

## 2021-04-01 DIAGNOSIS — I4891 Unspecified atrial fibrillation: Secondary | ICD-10-CM | POA: Diagnosis not present

## 2021-04-01 DIAGNOSIS — R338 Other retention of urine: Secondary | ICD-10-CM | POA: Diagnosis not present

## 2021-04-01 DIAGNOSIS — R197 Diarrhea, unspecified: Secondary | ICD-10-CM | POA: Diagnosis not present

## 2021-04-01 DIAGNOSIS — N1831 Chronic kidney disease, stage 3a: Secondary | ICD-10-CM

## 2021-04-01 LAB — COMPREHENSIVE METABOLIC PANEL
ALT: 8 U/L (ref 0–44)
AST: 10 U/L — ABNORMAL LOW (ref 15–41)
Albumin: 1.9 g/dL — ABNORMAL LOW (ref 3.5–5.0)
Alkaline Phosphatase: 88 U/L (ref 38–126)
Anion gap: 6 (ref 5–15)
BUN: 19 mg/dL (ref 6–20)
CO2: 20 mmol/L — ABNORMAL LOW (ref 22–32)
Calcium: 7.3 mg/dL — ABNORMAL LOW (ref 8.9–10.3)
Chloride: 108 mmol/L (ref 98–111)
Creatinine, Ser: 1.53 mg/dL — ABNORMAL HIGH (ref 0.44–1.00)
GFR, Estimated: 40 mL/min — ABNORMAL LOW (ref >60)
Glucose, Bld: 113 mg/dL — ABNORMAL HIGH (ref 70–99)
Potassium: 4 mmol/L (ref 3.5–5.1)
Sodium: 134 mmol/L — ABNORMAL LOW (ref 135–145)
Total Bilirubin: 0.7 mg/dL (ref 0.3–1.2)
Total Protein: 5.1 g/dL — ABNORMAL LOW (ref 6.5–8.1)

## 2021-04-01 LAB — D-DIMER, QUANTITATIVE: D-Dimer, Quant: 3.03 ug/mL-FEU — ABNORMAL HIGH (ref 0.00–0.50)

## 2021-04-01 LAB — CBC WITH DIFFERENTIAL/PLATELET
Abs Immature Granulocytes: 0.06 10*3/uL (ref 0.00–0.07)
Basophils Absolute: 0 10*3/uL (ref 0.0–0.1)
Basophils Relative: 0 %
Eosinophils Absolute: 0.1 10*3/uL (ref 0.0–0.5)
Eosinophils Relative: 1 %
HCT: 31 % — ABNORMAL LOW (ref 36.0–46.0)
Hemoglobin: 9.2 g/dL — ABNORMAL LOW (ref 12.0–15.0)
Immature Granulocytes: 1 %
Lymphocytes Relative: 15 %
Lymphs Abs: 1.2 10*3/uL (ref 0.7–4.0)
MCH: 25.7 pg — ABNORMAL LOW (ref 26.0–34.0)
MCHC: 29.7 g/dL — ABNORMAL LOW (ref 30.0–36.0)
MCV: 86.6 fL (ref 80.0–100.0)
Monocytes Absolute: 0.7 10*3/uL (ref 0.1–1.0)
Monocytes Relative: 8 %
Neutro Abs: 6 10*3/uL (ref 1.7–7.7)
Neutrophils Relative %: 75 %
Platelets: 173 10*3/uL (ref 150–400)
RBC: 3.58 MIL/uL — ABNORMAL LOW (ref 3.87–5.11)
RDW: 21.2 % — ABNORMAL HIGH (ref 11.5–15.5)
WBC: 8 10*3/uL (ref 4.0–10.5)
nRBC: 0 % (ref 0.0–0.2)

## 2021-04-01 LAB — GLUCOSE, CAPILLARY
Glucose-Capillary: 137 mg/dL — ABNORMAL HIGH (ref 70–99)
Glucose-Capillary: 130 mg/dL — ABNORMAL HIGH (ref 70–99)
Glucose-Capillary: 129 mg/dL — ABNORMAL HIGH (ref 70–99)
Glucose-Capillary: 134 mg/dL — ABNORMAL HIGH (ref 70–99)
Glucose-Capillary: 83 mg/dL (ref 70–99)

## 2021-04-01 LAB — MAGNESIUM: Magnesium: 1.5 mg/dL — ABNORMAL LOW (ref 1.7–2.4)

## 2021-04-01 LAB — LACTOFERRIN, FECAL, QUALITATIVE: Lactoferrin, Fecal, Qual: POSITIVE — AB

## 2021-04-01 LAB — C-REACTIVE PROTEIN: CRP: 5.3 mg/dL — ABNORMAL HIGH (ref <1.0)

## 2021-04-01 LAB — PHOSPHORUS: Phosphorus: 3.2 mg/dL (ref 2.5–4.6)

## 2021-04-01 LAB — FERRITIN: Ferritin: 85 ng/mL (ref 11–307)

## 2021-04-01 MED ORDER — CALCIUM GLUCONATE-NACL 1-0.675 GM/50ML-% IV SOLN
1.0000 g | Freq: Once | INTRAVENOUS | Status: AC
  Administered 2021-04-01: 1000 mg via INTRAVENOUS
  Filled 2021-04-01: qty 50

## 2021-04-01 MED ORDER — MAGNESIUM SULFATE 2 GM/50ML IV SOLN
2.0000 g | Freq: Once | INTRAVENOUS | Status: AC
  Administered 2021-04-01: 2 g via INTRAVENOUS
  Filled 2021-04-01: qty 50

## 2021-04-01 MED ORDER — CHOLESTYRAMINE LIGHT 4 G PO PACK
4.0000 g | PACK | Freq: Two times a day (BID) | ORAL | Status: DC
  Administered 2021-04-01 – 2021-04-02 (×3): 4 g via ORAL
  Filled 2021-04-01 (×7): qty 1

## 2021-04-01 NOTE — Progress Notes (Signed)
Patient Demographics:    [name], is a 55 y.o. female, DOB - 04-14-1966, QMG:[id number]  Admit date - 03/28/2021   Admitting Physician Courage Denton Brick, MD  Outpatient Primary MD for the patient is Adamo, [name], MD  LOS - 3  Chief Complaint  Patient presents with  . Diarrhea        Subjective:    [name] was seen and examined this morning, stable pleasant. Reporting stool is more formed now.    Assessment  & Plan :    Principal Problem:   Diarrhea Active Problems:   COVID-19 virus infection   Hypokalemia due to excessive gastrointestinal loss of potassium   Hypomagnesemia   Acute urinary retention -CKD (chronic kidney disease) stage 3A, GFR 30-59 ml/min    Atrial fibrillation with rapid ventricular response (HCC)   Diabetic peripheral neuropathy (HCC)   Chronic combined systolic (congestive) and diastolic (congestive) heart failure (HCC)   Coronary artery disease involving native coronary artery of native heart without angina pectoris   Hyperlipidemia associated with type 2 diabetes mellitus ([place])  Brief Summary:- 55 y.o. female, with type 2 diabetes mellitus, lipidemia, stage IV CKD, hypertension, ischemic cardiomyopathy with CAD, GERD, history of non-STEMI, paroxysmal A. fib, bilateral amputee, with known history of persistent diarrhea readmitted with worsening diarrhea and electrolyte derangement  ------------------------------------------------------------------------------------------------------------------------  Assessment and plan  1)Diarrhea--much improved since admission chronic but worse lately GI consult appreciated--recommended  Fecal lactoferrin, Fecal pancreatic elastase, Stool osmolality --which has been collected  S/p 24-hour stool collection for qualitative fecal fat analysis and electrolytes.  S/p 24-hour urine collection for 5-hydroxyindoleacetic acid   Foley catheter rectal tube discontinued  03/31/2021 Stool C. 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 We will restart in a.m.  7)Controlled type II  diabetes -A1c is 5.7  Use Novolog/Humalog Sliding scale insulin with Accu-Cheks/Fingersticks as ordered  8)CKD stageIIIA -renal function at baseline, -- renally adjust medications, avoid nephrotoxic agents / dehydration  / hypotension -Monitoring renal function mildly elevated BUN 14, creatinine 1.24, 1.27  >> 1.41 >> 1.53 today We will hold diuretics today  9)Peripheral vascular disease with bilateral above-knee amputations -Continue with statin and Xarelto  10)diabetic neuropathy -Continue with Lyrica, home amitriptyline given electrolyte abnormalities  11)Tobacco abuse continue with nicotine patch  12)Covid 19 infection--- patient is vaccinated but not boosted Trend and follow inflammatory markers, continue IV remdesivir for 3 days only Stable no respiratory compromise or cough  13)Acute Urinary Retention---Foley catheter was placed on this admission, will be discontinued today 03/31/2021, voiding  trial     Disposition/Need  for in-Hospital Stay- patient unable to be discharged at this time due to -significant profuse frequent loose stools with significant life-threatening electrolyte abnormalities requiring IV replacements*  Status is: Inpatient  Remains inpatient appropriate because:Please see disposition above   Disposition: The patient is from: Home              Anticipated d/c is to: Home              Anticipated d/c date is: 2 days              Patient currently is not medically stable to d/c. Barriers: Not Clinically Stable-   Code Status : -  Code Status: Full Code   Family Communication:   NA (patient is alert, awake and coherent) husband at bedside  Consults  :  Gi  DVT Prophylaxis  :   - SCDs   Rivaroxaban (XARELTO) tablet 15 mg    Lab Results  Component Value Date   PLT 173 04/01/2021    Inpatient Medications  Scheduled Meds: . acidophilus  1 capsule Oral TID WC  . amiodarone  200 mg Oral Daily  . vitamin C  500 mg Oral Daily  . Chlorhexidine Gluconate Cloth  6 each Topical Daily  . diphenoxylate-atropine  1 tablet Oral TID  . Gerhardt's butt cream   Topical QID  . insulin aspart  0-15 Units Subcutaneous TID WC  . insulin aspart  0-5 Units Subcutaneous QHS  . metoprolol tartrate  12.5 mg Oral BID  . multivitamin with minerals  1 tablet Oral Daily  . nicotine  21 mg Transdermal Daily  . nystatin   Topical TID  . octreotide  50 mcg Subcutaneous Q12H  . pantoprazole  40 mg Oral Daily  . pregabalin  100 mg Oral BID  . Rivaroxaban  15 mg Oral Q supper  . rosuvastatin  20 mg Oral q1800  . sodium bicarbonate  650 mg Oral BID  . torsemide  20 mg Oral Daily  . cyanocobalamin  1,000 mcg Oral Daily  . zinc sulfate  220 mg Oral Daily   Continuous Infusions: . sodium chloride 20 mL/hr at 03/31/21 0603  . calcium gluconate 1,000 mg (04/01/21 1120)  . remdesivir 100 mg in NS 100 mL 100 mg (04/01/21 0943)   PRN Meds:.acetaminophen, dicyclomine,  guaiFENesin-dextromethorphan    Anti-infectives (From admission, onward)   Start     Dose/Rate Route Frequency Ordered Stop   03/31/21 1000  remdesivir 100 mg in sodium chloride 0.9 % 100 mL IVPB  Status:  Discontinued       "Followed by" Linked Group Details   100 mg 200 mL/hr over 30 Minutes Intravenous Daily 03/30/21 1210 03/30/21 1220   03/31/21 1000  remdesivir 100 mg in sodium chloride 0.9 % 100 mL IVPB        100 mg 200 mL/hr over 30 Minutes Intravenous Daily 03/30/21 1226 04/04/21 0959   03/30/21 1300  remdesivir 200 mg in sodium chloride 0.9% 250 mL IVPB  Status:  Discontinued       "Followed by" Linked Group Details   200 mg 580 mL/hr over 30 Minutes Intravenous Once 03/30/21 1210 03/30/21 1220   03/30/21 1300  remdesivir 100 mg in sodium chloride 0.9 % 100 mL IVPB        100 mg 200 mL/hr over 30 Minutes Intravenous Every 30 min 03/30/21 1219 03/30/21 1401   03/29/21 1000  remdesivir 100 mg in sodium chloride 0.9 % 100 mL IVPB  Status:  Discontinued       "Followed by" Linked Group Details   100 mg 200 mL/hr over 30 Minutes Intravenous Daily 03/28/21 2132 03/28/21 2134   03/29/21 1000  remdesivir 100 mg in sodium chloride 0.9 % 100 mL IVPB        100 mg 200 mL/hr over 30 Minutes Intravenous Daily 03/28/21 2136 03/30/21 1222   03/28/21 2206  remdesivir 100 mg in sodium chloride 0.9 % 100 mL IVPB       "Followed by" Linked Group Details   100 mg 200 mL/hr over 30 Minutes Intravenous  Once 03/28/21 2137 03/29/21 0108   03/28/21 2145  remdesivir 100 mg in sodium chloride 0.9 % 100 mL IVPB       "Followed by" Linked Group Details   100 mg 200 mL/hr over 30 Minutes Intravenous  Once 03/28/21 2137 03/29/21 0107   03/28/21 2130  remdesivir 200 mg in sodium chloride 0.9% 250 mL IVPB  Status:  Discontinued       "Followed by" Linked Group Details   200 mg 580 mL/hr over 30 Minutes Intravenous Once 03/28/21 2132 03/28/21 2134        Objective:   Vitals:   03/31/21 1458  03/31/21 2048 04/01/21 0537 04/01/21 0916  BP: (!) 113/56 113/63 (!) 119/58 131/62  Pulse: 68 66 (!) 59 68  Resp:   16 18  Temp: 99 F (37.2 C) 99.4 F (37.4 C) 98.4 F (36.9 C) 97.9 F (36.6 C)  TempSrc: Oral Oral Oral Oral  SpO2: 100% 98% 94% 100%  Weight:      Height:        Wt Readings from Last 3 Encounters:  03/28/21 74.8 kg  03/08/21 101.2 kg  12/05/20 101.2 kg     Intake/Output Summary (Last 24 hours) at 04/01/2021 1132 Last data filed at 04/01/2021 0842 Gross per 24 hour  Intake 1748.55 ml  Output 904 ml  Net 844.55 ml       Physical Exam:   General:  Alert, oriented, cooperative, no distress;   HEENT:  Normocephalic, PERRL, otherwise with in Normal limits   Neuro:  CNII-XII intact. , normal motor and sensation, reflexes intact   Lungs:   Clear to auscultation BL, Respirations unlabored, no wheezes / crackles  Cardio:    S1/S2, RRR, No murmure, No Rubs or Gallops   Abdomen:   Soft, non-tender, bowel sounds active all four quadrants,  no guarding or peritoneal signs.  Muscular skeletal:  Left AKA, right BKA,  Limited exam - in bed, able to move all 4 extremities, ,  2+ pulses,  symmetric, No pitting edema  Skin:  Dry, warm to touch, negative for any Rashes,  Wounds: Please see nursing documentation Pressure Injury Stage III -  Full thickness tissue loss. Subcutaneous fat may be visible but bone, tendon or muscle are NOT exposed. (Active)     Location: Ischial tuberosity  Location Orientation: Right  Staging: Stage III -  Full thickness tissue loss. Subcutaneous fat may be visible but bone, tendon or muscle are NOT exposed.  Wound Description (Comments):   Present on Admission: Yes      Foley catheter and rectal Flexi-Seal discontinued on 04/01/2019      Data Review:   Micro Results Recent Results (from the past 240 hour(s))  Resp Panel by RT-PCR (Flu A&B, Covid) Nasopharyngeal Swab     Status: Abnormal   Collection Time: 03/28/21  7:22 PM    Specimen: Nasopharyngeal Swab; Nasopharyngeal(NP)  swabs in vial transport medium  Result Value Ref Range Status   SARS Coronavirus 2 by RT PCR POSITIVE (A) NEGATIVE Final    Comment: RESULT CALLED TO, READ BACK BY AND VERIFIED WITH: WATLINGTON,K RN @2114  03/28/21 BILLINGSLEY,L (NOTE) SARS-CoV-2 target nucleic acids are DETECTED.  The SARS-CoV-2 RNA is generally detectable in upper respiratory specimens during the acute phase of infection. Positive results are indicative of the presence of the identified virus, but do not rule out bacterial infection or co-infection with other pathogens not detected by the test. Clinical correlation with patient history and other diagnostic information is necessary to determine patient infection status. The expected result is Negative.  Fact Sheet for Patients: EntrepreneurPulse.com.au  Fact Sheet for Healthcare Providers: IncredibleEmployment.be  This test is not yet approved or cleared by the [place] FDA and  has been authorized for detection and/or diagnosis of SARS-CoV-2 by FDA under an Emergency Use Authorization (EUA).  This EUA will remain in effect (meaning this t est can be used) for the duration of  the COVID-19 declaration under Section 564(b)(1) of the Act, 21 U.S.C. section 360bbb-3(b)(1), unless the authorization is terminated or revoked sooner.     Influenza A by PCR NEGATIVE NEGATIVE Final   Influenza B by PCR NEGATIVE NEGATIVE Final    Comment: (NOTE) The Xpert Xpress SARS-CoV-2/FLU/RSV plus assay is intended as an aid in the diagnosis of influenza from Nasopharyngeal swab specimens and should not be used as a sole basis for treatment. Nasal washings and aspirates are unacceptable for Xpert Xpress SARS-CoV-2/FLU/RSV testing.  Fact Sheet for Patients: EntrepreneurPulse.com.au  Fact Sheet for Healthcare Providers: IncredibleEmployment.be  This test  is not yet approved or cleared by the [place] FDA and has been authorized for detection and/or diagnosis of SARS-CoV-2 by FDA under an Emergency Use Authorization (EUA). This EUA will remain in effect (meaning this test can be used) for the duration of the COVID-19 declaration under Section 564(b)(1) of the Act, 21 U.S.C. section 360bbb-3(b)(1), unless the authorization is terminated or revoked.  Performed at [facility], [address], [place], [place] [address]   C Difficile Quick Screen w PCR reflex     Status: None   Collection Time: 03/29/21  8:46 AM   Specimen: STOOL  Result Value Ref Range Status   C Diff antigen NEGATIVE NEGATIVE Final   C Diff toxin NEGATIVE NEGATIVE Final   C Diff interpretation No C. difficile detected.  Final    Comment: NEGATIVE Performed at [facility], [address], [place], [place] [address]   Gastrointestinal Panel by PCR , Stool     Status: None   Collection Time: 03/29/21  8:46 AM   Specimen: STOOL  Result Value Ref Range Status   Campylobacter species NOT DETECTED NOT DETECTED Final   Plesimonas shigelloides NOT DETECTED NOT DETECTED Final   Salmonella species NOT DETECTED NOT DETECTED Final   Yersinia enterocolitica NOT DETECTED NOT DETECTED Final   Vibrio species NOT DETECTED NOT DETECTED Final   Vibrio cholerae NOT DETECTED NOT DETECTED Final   Enteroaggregative E coli (EAEC) NOT DETECTED NOT DETECTED Final   Enteropathogenic E coli (EPEC) NOT DETECTED NOT DETECTED Final   Enterotoxigenic E coli (ETEC) NOT DETECTED NOT DETECTED Final   Shiga like toxin producing E coli (STEC) NOT DETECTED NOT DETECTED Final   Shigella/Enteroinvasive E coli (EIEC) NOT DETECTED NOT DETECTED Final   Cryptosporidium NOT DETECTED NOT DETECTED Final   Cyclospora cayetanensis NOT DETECTED NOT DETECTED Final  Entamoeba histolytica NOT DETECTED NOT DETECTED Final   Giardia lamblia NOT DETECTED NOT DETECTED Final   Adenovirus F40/41 NOT DETECTED NOT  DETECTED Final   Astrovirus NOT DETECTED NOT DETECTED Final   Norovirus GI/GII NOT DETECTED NOT DETECTED Final   Rotavirus A NOT DETECTED NOT DETECTED Final   Sapovirus (I, II, IV, and V) NOT DETECTED NOT DETECTED Final    Comment: Performed at [facility], [address] [place], [place] [address]    Radiology Reports CT ABDOMEN PELVIS W CONTRAST  Result Date: 03/29/2021 CLINICAL DATA:  Acute abdominal pain with continued diarrhea and occasional vomiting. EXAM: CT ABDOMEN AND PELVIS WITH CONTRAST TECHNIQUE: Multidetector CT imaging of the abdomen and pelvis was performed using the standard protocol following bolus administration of intravenous contrast. CONTRAST:  117mL OMNIPAQUE IOHEXOL 300 MG/ML  SOLN COMPARISON:  Most recent CT 11/30/2017 FINDINGS: Lower chest: Small bilateral pleural effusions, present on prior exam, minimally increased. Left basilar opacity is similar, likely scarring/chronic atelectasis. Heart is enlarged. Coronary artery calcifications. Small pericardial effusion. Hepatobiliary: Diffusely heterogeneous appearance of the liver. Slight nodular hepatic contours suggest cirrhosis. No obvious focal hepatic lesion allowing for phase of contrast. Post cholecystectomy. There is no biliary dilatation. Small volume perihepatic ascites. Pancreas: No ductal dilatation or inflammation. Spleen: Multiple rounded low-density lesions scattered throughout the spleen with lobulated borders, largest measuring 18 mm in the upper and lower spleen. Heterogeneous splenic enhancement likely related to phase of contrast. No splenomegaly. Adrenals/Urinary Tract: 11 mm left adrenal nodule. Normal right adrenal gland. No hydronephrosis. Bilateral perinephric edema, right greater than left. Mild thinning of bilateral renal parenchyma. No visualized renal or ureteral calculi. No focal renal abnormality. Urinary bladder is partially distended, thick walled. Stomach/Bowel: Unremarkable stomach. No  small bowel obstruction. No evidence of small bowel inflammation, mild small bowel thickening felt to be reactive secondary to ascites. Normal appendix. There is liquid stool in the ascending and proximal transverse colon. Equivocal wall thickening of the descending colon. No bowel pneumatosis. 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 Bifem bypass graft appears patent. Stent in the right common femoral artery which is patent. The left common femoral artery appears occluded. Patent portal vein. Patent mesenteric vasculature. No portal venous or mesenteric gas. No enlarged lymph nodes in the abdomen or pelvis. Reproductive: Normal for age uterine atrophy.  No adnexal mass. Other: Small to moderate volume abdominopelvic ascites. There is prominent subcutaneous body wall edema. No free air or focal fluid collection. Postsurgical change of the anterior abdominal wall. Musculoskeletal: Postsurgical change in the lower lumbar spine bones appear diffusely under mineralized. Probable bone graft donor site right iliac crest. IMPRESSION: 1. Liquid stool in the ascending and proximal transverse colon with equivocal wall thickening of the descending colon. Findings likely represent diarrheal illness in possible mild colitis of the descending colon. 2. Heterogeneous appearance of the liver with nodular hepatic contours, suggesting cirrhosis. 3. Small to moderate volume abdominopelvic ascites. Prominent subcutaneous body wall edema. Findings suggest third-spacing. 4. Bilateral perinephric edema, right greater than left, as well as bladder wall thickening. This may be due to underlying chronic renal disease, however possibility of urinary tract infection is raised. Recommend correlation with urinalysis. 5. Multiple rounded low-density lesions scattered throughout the spleen, largest measuring 18 mm in the upper and lower spleen. These are nonspecific and may be  cysts or hemangiomas. 6. Small bilateral pleural effusions, minimally increased from prior exam. 7. Indeterminate 11 mm left adrenal nodule.  Aortic Atherosclerosis (ICD10-I70.0). Electronically Signed   By: Keith Rake M.D.   On: 03/29/2021 15:47   US RENAL  Result Date: 03/16/2021 CLINICAL DATA:  Acute kidney injury, abnormal labs today EXAM: RENAL / URINARY TRACT ULTRASOUND COMPLETE COMPARISON:  Renal ultrasound 10/10/2020 FINDINGS: Right Kidney: Renal measurements: 13.2 x 5.7 x 5.4 cm = volume: 213.6 mL. Echogenicity within normal limits. No mass or hydronephrosis visualized. Left Kidney: Renal measurements: 11.7 x 6.7 x 5.1 cm = volume: 209.1 mL. Limited visualization, however visualized portions demonstrate normal echogenicity without hydronephrosis. Bladder: Appears normal for degree of bladder distention. Bilateral ureteral jets are seen. Other: None. IMPRESSION: No hydronephrosis. Electronically Signed   By: Maurine Simmering   On: 03/16/2021 08:45     CBC Recent Labs  Lab 03/28/21 1420 03/29/21 0350 03/30/21 0611 03/31/21 0553 04/01/21 0456  WBC 5.7 5.3 6.2 7.2 8.0  HGB 9.4* 9.2* 8.5* 10.4* 9.2*  HCT 30.3* 30.6* 28.6* 34.7* 31.0*  PLT 172 148* 144* 185 173  MCV 84.9 86.0 85.9 85.5 86.6  MCH 26.3 25.8* 25.5* 25.6* 25.7*  MCHC 31.0 30.1 29.7* 30.0 29.7*  RDW 20.5* 20.4* 20.6* 21.0* 21.2*  LYMPHSABS 0.5*  --   --  0.9 1.2  MONOABS 0.3  --   --  0.4 0.7  EOSABS 0.1  --   --  0.1 0.1  BASOSABS 0.0  --   --  0.0 0.0    Chemistries  Recent Labs  Lab 03/28/21 1420 03/28/21 1700 03/29/21 0350 03/30/21 0611 03/31/21 0553 04/01/21 0456  NA 137  --  136 133* 136 134*  K 2.7*  --  3.6 3.7 3.9 4.0  CL 107  --  109 104 108 108  CO2 24  --  22 24 21* 20*  GLUCOSE 85  --  94 88 103* 113*  BUN 7  --  6 11 14 19   CREATININE 1.23*  --  1.24* 1.27* 1.41* 1.53*  CALCIUM 6.6*  --  6.6* 6.6* 7.1* 7.3*  MG  --  1.0* 1.8  --  1.7 1.5*  AST 11*  --  10*  --  9* 10*  ALT 11  --  10  --  8  8  ALKPHOS 92  --  91  --  99 88  BILITOT 0.5  --  0.4  --  0.5 0.7   ------------------------------------------------------------------------------------------------------------------ No results for input(s): CHOL, HDL, LDLCALC, TRIG, CHOLHDL, LDLDIRECT in the last 72 hours.  Lab Results  Component Value Date   HGBA1C 5.6 03/09/2021   ------------------------------------------------------------------------------------------------------------------ No results for input(s): TSH, T4TOTAL, T3FREE, THYROIDAB in the last 72 hours.  Invalid input(s): FREET3 ------------------------------------------------------------------------------------------------------------------ Recent Labs    03/31/21 0553  FERRITIN 101    Coagulation profile No results for input(s): INR, PROTIME in the last 168 hours.  Recent Labs    03/31/21 0553 04/01/21 0456  DDIMER 2.90* 3.03*    Cardiac Enzymes No results for input(s): CKMB, TROPONINI, MYOGLOBIN in the last 168 hours.  Invalid input(s): CK ------------------------------------------------------------------------------------------------------------------    Component Value Date/Time   BNP 2,905.0 (H) 12/01/2020 6701   Deatra James M.D on 04/01/2021 at 11:32 AM  Go to [email or web address] - for contact info  [name] - Office  [phone]

## 2021-04-01 NOTE — Progress Notes (Signed)
.nr  Subjective:  Patient states her appetite is good.  She has had 4 bowel movements since daylight today.  First stool was thick and pasty and last stool was watery.  She is not having any abdominal pain.  She is afraid to go home without a definite diagnosis.  Current Medications:  Current Facility-Administered Medications:  .  0.9 %  sodium chloride infusion, , Intravenous, Continuous, Emokpae, Courage, MD, Last Rate: 20 mL/hr at 03/31/21 0603, New Bag at 03/31/21 0603 .  acetaminophen (TYLENOL) tablet 650 mg, 650 mg, Oral, Q6H PRN, Reubin Milan, MD, 650 mg at 04/01/21 0534 .  acidophilus (RISAQUAD) capsule 1 capsule, 1 capsule, Oral, TID WC, Shahmehdi, Seyed A, MD, 1 capsule at 04/01/21 1255 .  amiodarone (PACERONE) tablet 200 mg, 200 mg, Oral, Daily, Emokpae, Courage, MD, 200 mg at 04/01/21 0927 .  ascorbic acid (VITAMIN C) tablet 500 mg, 500 mg, Oral, Daily, Emokpae, Courage, MD, 500 mg at 04/01/21 0926 .  Chlorhexidine Gluconate Cloth 2 % PADS 6 each, 6 each, Topical, Daily, Denton Brick, Courage, MD, 6 each at 04/01/21 0951 .  dicyclomine (BENTYL) capsule 10 mg, 10 mg, Oral, Q6H PRN, Elgergawy, Silver Huguenin, MD .  diphenoxylate-atropine (LOMOTIL) 2.5-0.025 MG per tablet 1 tablet, 1 tablet, Oral, TID, Paitlyn Mcclatchey, Mechele Dawley, MD, 1 tablet at 04/01/21 0926 .  Gerhardt's butt cream, , Topical, QID, Deatra James, MD, Given at 04/01/21 0933 .  guaiFENesin-dextromethorphan (ROBITUSSIN DM) 100-10 MG/5ML syrup 10 mL, 10 mL, Oral, Q4H PRN, Reubin Milan, MD, 10 mL at 04/01/21 0534 .  insulin aspart (novoLOG) injection 0-15 Units, 0-15 Units, Subcutaneous, TID WC, Elgergawy, Silver Huguenin, MD, 2 Units at 04/01/21 1255 .  insulin aspart (novoLOG) injection 0-5 Units, 0-5 Units, Subcutaneous, QHS, Elgergawy, Dawood S, MD .  magnesium sulfate IVPB 2 g 50 mL, 2 g, Intravenous, Once, Shahmehdi, Seyed A, MD .  metoprolol tartrate (LOPRESSOR) tablet 12.5 mg, 12.5 mg, Oral, BID, Elgergawy, Silver Huguenin, MD,  12.5 mg at 04/01/21 0927 .  multivitamin with minerals tablet 1 tablet, 1 tablet, Oral, Daily, Emokpae, Courage, MD, 1 tablet at 04/01/21 0926 .  nicotine (NICODERM CQ - dosed in mg/24 hours) patch 21 mg, 21 mg, Transdermal, Daily, Elgergawy, Silver Huguenin, MD, 21 mg at 04/01/21 0932 .  nystatin (MYCOSTATIN/NYSTOP) topical powder, , Topical, TID, Reubin Milan, MD, Given at 04/01/21 [id number] .  octreotide (SANDOSTATIN) injection 50 mcg, 50 mcg, Subcutaneous, Q12H, Exavior Kimmons U, MD, 50 mcg at 04/01/21 0534 .  pantoprazole (PROTONIX) EC tablet 40 mg, 40 mg, Oral, Daily, Elgergawy, Silver Huguenin, MD, 40 mg at 04/01/21 0926 .  pregabalin (LYRICA) capsule 100 mg, 100 mg, Oral, BID, Elgergawy, Silver Huguenin, MD, 100 mg at 04/01/21 0927 .  remdesivir 100 mg in sodium chloride 0.9 % 100 mL IVPB, 100 mg, Intravenous, Daily, Emokpae, Courage, MD, Last Rate: 200 mL/hr at 04/01/21 0943, 100 mg at 04/01/21 0943 .  Rivaroxaban (XARELTO) tablet 15 mg, 15 mg, Oral, Q supper, Elgergawy, Silver Huguenin, MD, 15 mg at 03/31/21 1624 .  rosuvastatin (CRESTOR) tablet 20 mg, 20 mg, Oral, q1800, Elgergawy, Silver Huguenin, MD, 20 mg at 03/31/21 1624 .  sodium bicarbonate tablet 650 mg, 650 mg, Oral, BID, Elgergawy, Silver Huguenin, MD, 650 mg at 04/01/21 0927 .  torsemide (DEMADEX) tablet 20 mg, 20 mg, Oral, Daily, Shahmehdi, Seyed A, MD, 20 mg at 04/01/21 0927 .  vitamin B-12 (CYANOCOBALAMIN) tablet 1,000 mcg, 1,000 mcg, Oral, Daily, Elgergawy, Silver Huguenin, MD, 1,000 mcg  at 04/01/21 0926 .  zinc sulfate capsule 220 mg, 220 mg, Oral, Daily, Emokpae, Courage, MD, 220 mg at 04/01/21 0926   Objective: Blood pressure 131/62, pulse 68, temperature 97.9 F (36.6 C), temperature source Oral, resp. rate 18, height _0  (1.702 m), weight 74.8 kg, SpO2 100 %. Patient was not examined today. I conducted visit over the phone since patient has COVID.    Labs/studies Results:   CBC Latest Ref Rng & Units 04/01/2021 03/31/2021 03/30/2021  WBC 4.0 - 10.5 K/uL  8.0 7.2 6.2  Hemoglobin 12.0 - 15.0 g/dL 9.2(L) 10.4(L) 8.5(L)  Hematocrit 36.0 - 46.0 % 31.0(L) 34.7(L) 28.6(L)  Platelets 150 - 400 K/uL 173 185 144(L)    CMP Latest Ref Rng & Units 04/01/2021 03/31/2021 03/30/2021  Glucose 70 - 99 mg/dL 113(H) 103(H) 88  BUN 6 - 20 mg/dL _1 Creatinine 0.44 - 1.00 mg/dL 1.53(H) 1.41(H) 1.27(H)  Sodium 135 - 145 mmol/L 134(L) 136 133(L)  Potassium 3.5 - 5.1 mmol/L 4.0 3.9 3.7  Chloride 98 - 111 mmol/L 108 108 104  CO2 22 - 32 mmol/L 20(L) 21(L) 24  Calcium 8.9 - 10.3 mg/dL 7.3(L) 7.1(L) 6.6(L)  Total Protein 6.5 - 8.1 g/dL 5.1(L) 5.6(L) -  Total Bilirubin 0.3 - 1.2 mg/dL 0.7 0.5 -  Alkaline Phos 38 - 126 U/L 88 99 -  AST 15 - 41 U/L 10(L) 9(L) -  ALT 0 - 44 U/L 8 8 -    Hepatic Function Latest Ref Rng & Units 04/01/2021 03/31/2021 03/30/2021  Total Protein 6.5 - 8.1 g/dL 5.1(L) 5.6(L) -  Albumin 3.5 - 5.0 g/dL 1.9(L) 2.1(L) 1.8(L)  AST 15 - 41 U/L 10(L) 9(L) -  ALT 0 - 44 U/L 8 8 -  Alk Phosphatase 38 - 126 U/L 88 99 -  Total Bilirubin 0.3 - 1.2 mg/dL 0.7 0.5 -  Bilirubin, Direct 0.0 - 0.2 mg/dL - - -    Lab Results  Component Value Date   CRP 3.3 (H) 03/31/2021     Fecal lactoferrin pending Fecal pancreatic elastase pending Stool osmolality pending  24-hour stool collection for electrolytes fecal fat results pending 24-hour urine for 5 HIAA results pending.  Assessment:  #1.  Chronic diarrhea.  So far work-up has been negative.  Multiple studies are pending including urinary 5 HIAA as well as stool osmolality electrolytes and fecal fat.  She is presently on diphenoxylate and octreotide SQ twice daily.  Serum albumin remains quite low.  #2.  COVID infection.  Patient is afebrile.  CRP is trending upwards.  #3.  Anemia appears to be due to chronic disease.     Plan:  While waiting for pending stool studies will add cholestyramine to her regiment at a dose of 4 g p.o. twice daily to be given 2 hours before or after other  medications.

## 2021-04-02 DIAGNOSIS — R197 Diarrhea, unspecified: Secondary | ICD-10-CM | POA: Diagnosis not present

## 2021-04-02 DIAGNOSIS — Z515 Encounter for palliative care: Secondary | ICD-10-CM

## 2021-04-02 LAB — GLUCOSE, CAPILLARY
Glucose-Capillary: 147 mg/dL — ABNORMAL HIGH (ref 70–99)
Glucose-Capillary: 123 mg/dL — ABNORMAL HIGH (ref 70–99)
Glucose-Capillary: 88 mg/dL (ref 70–99)

## 2021-04-02 LAB — CBC WITH DIFFERENTIAL/PLATELET
Abs Immature Granulocytes: 0.05 10*3/uL (ref 0.00–0.07)
Basophils Absolute: 0 10*3/uL (ref 0.0–0.1)
Basophils Relative: 0 %
Eosinophils Absolute: 0 10*3/uL (ref 0.0–0.5)
Eosinophils Relative: 0 %
HCT: 31.5 % — ABNORMAL LOW (ref 36.0–46.0)
Hemoglobin: 9.2 g/dL — ABNORMAL LOW (ref 12.0–15.0)
Immature Granulocytes: 1 %
Lymphocytes Relative: 5 %
Lymphs Abs: 0.5 10*3/uL — ABNORMAL LOW (ref 0.7–4.0)
MCH: 25.2 pg — ABNORMAL LOW (ref 26.0–34.0)
MCHC: 29.2 g/dL — ABNORMAL LOW (ref 30.0–36.0)
MCV: 86.3 fL (ref 80.0–100.0)
Monocytes Absolute: 0.7 10*3/uL (ref 0.1–1.0)
Monocytes Relative: 7 %
Neutro Abs: 9.3 10*3/uL — ABNORMAL HIGH (ref 1.7–7.7)
Neutrophils Relative %: 87 %
Platelets: 194 10*3/uL (ref 150–400)
RBC: 3.65 MIL/uL — ABNORMAL LOW (ref 3.87–5.11)
RDW: 21.1 % — ABNORMAL HIGH (ref 11.5–15.5)
WBC: 10.7 10*3/uL — ABNORMAL HIGH (ref 4.0–10.5)
nRBC: 0 % (ref 0.0–0.2)

## 2021-04-02 LAB — URINALYSIS, ROUTINE W REFLEX MICROSCOPIC
Bilirubin Urine: NEGATIVE
Glucose, UA: NEGATIVE mg/dL
Ketones, ur: NEGATIVE mg/dL
Nitrite: POSITIVE — AB
Protein, ur: 100 mg/dL — AB
Specific Gravity, Urine: 1.008 (ref 1.005–1.030)
WBC, UA: >50 WBC/hpf — ABNORMAL HIGH (ref 0–5)
pH: 5 (ref 5.0–8.0)

## 2021-04-02 LAB — COMPREHENSIVE METABOLIC PANEL
ALT: 10 U/L (ref 0–44)
AST: 11 U/L — ABNORMAL LOW (ref 15–41)
Albumin: 2 g/dL — ABNORMAL LOW (ref 3.5–5.0)
Alkaline Phosphatase: 90 U/L (ref 38–126)
Anion gap: 9 (ref 5–15)
BUN: 31 mg/dL — ABNORMAL HIGH (ref 6–20)
CO2: 19 mmol/L — ABNORMAL LOW (ref 22–32)
Calcium: 7.3 mg/dL — ABNORMAL LOW (ref 8.9–10.3)
Chloride: 107 mmol/L (ref 98–111)
Creatinine, Ser: 1.64 mg/dL — ABNORMAL HIGH (ref 0.44–1.00)
GFR, Estimated: 37 mL/min — ABNORMAL LOW (ref >60)
Glucose, Bld: 113 mg/dL — ABNORMAL HIGH (ref 70–99)
Potassium: 4.1 mmol/L (ref 3.5–5.1)
Sodium: 135 mmol/L (ref 135–145)
Total Bilirubin: 0.5 mg/dL (ref 0.3–1.2)
Total Protein: 5.3 g/dL — ABNORMAL LOW (ref 6.5–8.1)

## 2021-04-02 LAB — OSMOLALITY, STOOL: Osmolality,Stl: 392 mOsmol/kg

## 2021-04-02 MED ORDER — ZINC SULFATE 220 (50 ZN) MG PO CAPS: 220.0000 mg | ORAL_CAPSULE | Freq: Every day | ORAL | 0 refills | Status: AC

## 2021-04-02 MED ORDER — ASCORBIC ACID 500 MG PO TABS: 500.0000 mg | ORAL_TABLET | Freq: Every day | ORAL | 0 refills | Status: AC

## 2021-04-02 MED ORDER — CHOLESTYRAMINE LIGHT 4 G PO PACK: 4.0000 g | PACK | Freq: Two times a day (BID) | ORAL | 1 refills | Status: DC

## 2021-04-02 MED ORDER — DIPHENOXYLATE-ATROPINE 2.5-0.025 MG PO TABS: 1.0000 | ORAL_TABLET | Freq: Three times a day (TID) | ORAL | 1 refills | Status: DC

## 2021-04-02 MED ORDER — CEPHALEXIN 500 MG PO CAPS: 500.0000 mg | ORAL_CAPSULE | Freq: Four times a day (QID) | ORAL | 0 refills | Status: DC

## 2021-04-02 MED ORDER — CEPHALEXIN 500 MG PO CAPS: 500.0000 mg | ORAL_CAPSULE | Freq: Four times a day (QID) | ORAL | 0 refills | Status: AC

## 2021-04-02 MED ORDER — "INSULIN SYRINGE 31G X 5/16"" 1 ML MISC": 1 refills | Status: AC

## 2021-04-02 MED ORDER — RISAQUAD PO CAPS: 1.0000 | ORAL_CAPSULE | Freq: Three times a day (TID) | ORAL | 0 refills | Status: AC

## 2021-04-02 MED ORDER — OCTREOTIDE ACETATE 100 MCG/ML IJ SOLN: 50.0000 ug | Freq: Two times a day (BID) | INTRAMUSCULAR | 1 refills | Status: DC

## 2021-04-02 NOTE — Care Management Important Message (Signed)
Important Message  Patient Details  Name: [name] MRN: [id number] Date of Birth: 13-Sep-1966   Medicare Important Message Given:  Yes     [name] 04/02/2021, 11:59 AM

## 2021-04-02 NOTE — Progress Notes (Signed)
Pt has not voided since last pm per pt. At 0900am today, bladder scan performed and showed >63ml of urine. Offered to I&O cath pt at that time but pt refused. Asked to finish her breakfast and attempt to void in bedpan, then reevaluate. At 1115, pt asked for bedpan and voided 500 ml of urine. Lower abd still firm although pt has never complained of distention or discomfort with palpation. Bladder scan completed post-void and still shows > 600 ml of urine in bladder. MD Manuella Ghazi notified and order received for post-void residual cath.  This was completed with #16 french foley cath and yielded an additional 500 ml of urine. Initial urine was clear yellow, but last 100 ml was very milky, lots of sediment. Pt states, "I have to admit, my stomach feels a lot better now." Abd completely soft to palpation at this time. MD Manuella Ghazi notified of results, order received to leave foley cath in for discharge with outpatient urologist referral and UA.

## 2021-04-02 NOTE — Discharge Summary (Addendum)
Physician Discharge Summary  [name] GMW:[id number] DOB: September 25, 1966 DOA: 03/28/2021  PCP: Frazier Richards, MD  Admit date: 03/28/2021  Discharge date: 04/02/2021  Admitted From:Home  Disposition:  Home  Recommendations for Outpatient Follow-up:  1. Follow up with PCP in 1-2 weeks 2. Follow up with GI as scheduled and follow up urine and stool studies 3. Continue on Lomotil 1 tablet 3 times daily, octreotide 50 mcg BID, and Questran 4 grams BID 4. Continue on Keflex as ordered for 7 days for UTI 5. Continue with foley catheter at home due to urinary retention and follow up with Urology outpatient with referral sent 6. Continue other home medications as prior 7. Outpatient palliative consultation will be set up  Home Health:None  Equipment/Devices:None  Discharge Condition:Stable  CODE STATUS: Full  Diet recommendation: Heart Healthy/Carb Modified  Brief/Interim Summary:  55 y.o.female,with type 2 diabetes mellitus, lipidemia, stage IV CKD, hypertension, ischemic cardiomyopathy with CAD, GERD, history of non-STEMI, paroxysmal A. fib, bilateral amputee, with known history of persistent diarrhea readmitted with worsening diarrhea and electrolyte derangement.  She has been seen and evaluated by gastroenterology with further stool studies and urine studies still pending.  She appears to have responded well to initiation of Lomotil scheduled as well as Questran and octreotide.  She is now having more formed stool with less frequency and electrolyte abnormalities have been corrected.  She was in stable condition for discharge today and will follow up with GI in the outpatient setting.  No other acute events noted throughout the course of this admission.   Discharge Diagnoses:  Principal Problem:   Diarrhea Active Problems:   Atrial fibrillation with rapid ventricular response (HCC)   Diabetic peripheral neuropathy (HCC)   Chronic combined systolic (congestive) and diastolic  (congestive) heart failure (HCC)   Coronary artery disease involving native coronary artery of native heart without angina pectoris   CKD stage 4 due to type 2 diabetes mellitus ([place])   Hyperlipidemia associated with type 2 diabetes mellitus (HCC)   S/P bilateral above knee amputation ([place])   COVID-19 virus infection   Hypokalemia due to excessive gastrointestinal loss of potassium   Hypomagnesemia   Acute urinary retention   CKD (chronic kidney disease) stage 3A, GFR 30-59 ml/min   Principal discharge diagnosis: Chronic diarrhea with undefined etiology with associated electrolyte abnormalities.  Discharge Instructions  Discharge Instructions    Ambulatory referral to Gastroenterology   Complete by: As directed    Ambulatory referral to Urology   Complete by: As directed    Void trial for urinary retention.   Diet - low sodium heart healthy   Complete by: As directed    Increase activity slowly   Complete by: As directed      Allergies as of 04/02/2021      Reactions   Lactose Intolerance (gi) Diarrhea      Medication List    STOP taking these medications   loperamide 2 MG capsule Commonly known as: IMODIUM     TAKE these medications   acidophilus Caps capsule Take 1 capsule by mouth 3 (three) times daily with meals.   albuterol 2 MG tablet Commonly known as: PROVENTIL Take 1 tablet (2 mg total) by mouth daily as needed for shortness of breath.   amiodarone 200 MG tablet Commonly known as: Pacerone Take 1 tablet (200 mg total) by mouth See admin instructions. oral amiodarone 419m bid x 7 days, then 2032mbid x 2 weeks, then 20049maily after that  amitriptyline 25 MG tablet Commonly known as: ELAVIL Take 1 tablet (25 mg total) by mouth at bedtime.   ascorbic acid 500 MG tablet Commonly known as: VITAMIN C Take 1 tablet (500 mg total) by mouth daily. Start taking on: Apr 03, 2021   cephALEXin 500 MG capsule Commonly known as: KEFLEX Take 1 capsule (500 mg  total) by mouth 4 (four) times daily for 7 days.   cholestyramine light 4 g packet Commonly known as: PREVALITE Take 1 packet (4 g total) by mouth 2 (two) times daily. Take either 2 hours before or 2 hours after usual medications are taken.   cyanocobalamin 1000 MCG tablet Take 1 tablet (1,000 mcg total) by mouth daily.   dicyclomine 10 MG capsule Commonly known as: BENTYL Take 1 capsule (10 mg total) by mouth every 6 (six) hours as needed for spasms.   diphenoxylate-atropine 2.5-0.025 MG tablet Commonly known as: LOMOTIL Take 1 tablet by mouth 3 (three) times daily.   Fenofibric Acid 45 MG Cpdr Take 1 capsule by mouth daily.   insulin lispro 100 UNIT/ML KwikPen Commonly known as: HumaLOG KwikPen Inject 4-10 Units into the skin 3 (three) times daily with meals. If blood sugar 121-150, 2 units If blood sugar 151-200, 4 units If blood sugar 201-250, 6 units If blood sugar 251-300, 8 units If blood sugar 301-500, 10 units If blood sugar greater than 350 call MD   INSULIN SYRINGE 1CC/31GX5/16" 31G X 5/16" 1 ML Misc For use with administration of octreotide twice daily.   metoprolol tartrate 25 MG tablet Commonly known as: LOPRESSOR Take 0.5 tablets (12.5 mg total) by mouth 2 (two) times daily.   naloxone 4 MG/0.1ML Liqd nasal spray kit Commonly known as: Narcan Place 1 spray into the nose once as needed (opioid overdose).   nicotine 21 mg/24hr patch Commonly known as: NICODERM CQ - dosed in mg/24 hours Place 21 mg onto the skin daily. Rotate sites and remove old patch prior to application   octreotide 100 MCG/ML Soln injection Commonly known as: SANDOSTATIN Inject 0.5 mLs (50 mcg total) into the skin every 12 (twelve) hours.   omeprazole 40 MG capsule Commonly known as: PRILOSEC Take 1 capsule (40 mg total) by mouth daily.   Potassium Chloride ER 20 MEQ Tbcr Take 20 mEq by mouth daily. 1 tab daily by mouth   pregabalin 100 MG capsule Commonly known as: LYRICA Take 1  capsule (100 mg total) by mouth 2 (two) times daily.   Rivaroxaban 15 MG Tabs tablet Commonly known as: XARELTO Take 1 tablet (15 mg total) by mouth daily with supper.   rosuvastatin 20 MG tablet Commonly known as: CRESTOR Take 1 tablet (20 mg total) by mouth daily at 6 PM.   sodium bicarbonate 650 MG tablet Take 1 tablet (650 mg total) by mouth 2 (two) times daily.   torsemide 20 MG tablet Commonly known as: DEMADEX Take 1 tablet (20 mg total) by mouth daily.   Venelex Oint Apply 1 application topically 3 (three) times daily. Every shift to bilateral buttocks, coccyx, and sacrum   zinc sulfate 220 (50 Zn) MG capsule Take 1 capsule (220 mg total) by mouth daily. Start taking on: Apr 03, 2021       Follow-up Information    Adamo, [name], MD. Schedule an appointment as soon as possible for a visit in 8 day(s).   Specialty: Family Medicine Why: Tuesday, Apr 10, 2021 @ 10:40.  Contact information: [facility] Follow up in 2 week(s).   Contact information: [place] [phone]             Allergies  Allergen Reactions  . Lactose Intolerance (Gi) Diarrhea    Consultations:  GI  Palliative   Procedures/Studies: CT ABDOMEN PELVIS W CONTRAST  Result Date: 03/29/2021 CLINICAL DATA:  Acute abdominal pain with continued diarrhea and occasional vomiting. EXAM: CT ABDOMEN AND PELVIS WITH CONTRAST TECHNIQUE: Multidetector CT imaging of the abdomen and pelvis was performed using the standard protocol following bolus administration of intravenous contrast. CONTRAST:  156m OMNIPAQUE IOHEXOL 300 MG/ML  SOLN COMPARISON:  Most recent CT 11/30/2017 FINDINGS: Lower chest: Small bilateral pleural effusions, present on prior exam, minimally increased. Left basilar opacity is similar, likely scarring/chronic atelectasis. Heart is enlarged. Coronary  artery calcifications. Small pericardial effusion. Hepatobiliary: Diffusely heterogeneous appearance of the liver. Slight nodular hepatic contours suggest cirrhosis. No obvious focal hepatic lesion allowing for phase of contrast. Post cholecystectomy. There is no biliary dilatation. Small volume perihepatic ascites. Pancreas: No ductal dilatation or inflammation. Spleen: Multiple rounded low-density lesions scattered throughout the spleen with lobulated borders, largest measuring 18 mm in the upper and lower spleen. Heterogeneous splenic enhancement likely related to phase of contrast. No splenomegaly. Adrenals/Urinary Tract: 11 mm left adrenal nodule. Normal right adrenal gland. No hydronephrosis. Bilateral perinephric edema, right greater than left. Mild thinning of bilateral renal parenchyma. No visualized renal or ureteral calculi. No focal renal abnormality. Urinary bladder is partially distended, thick walled. Stomach/Bowel: Unremarkable stomach. No small bowel obstruction. No evidence of small bowel inflammation, mild small bowel thickening felt to be reactive secondary to ascites. Normal appendix. There is liquid stool in the ascending and proximal transverse colon. Equivocal wall thickening of the descending colon. No bowel pneumatosis. 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 Bifem bypass graft appears patent. Stent in the right common femoral artery which is patent. The left common femoral artery appears occluded. Patent portal vein. Patent mesenteric vasculature. No portal venous or mesenteric gas. No enlarged lymph nodes in the abdomen or pelvis. Reproductive: Normal for age uterine atrophy.  No adnexal mass. Other: Small to moderate volume abdominopelvic ascites. There is prominent subcutaneous body wall edema. No free air or focal fluid collection. Postsurgical change of the anterior abdominal wall. Musculoskeletal: Postsurgical  change in the lower lumbar spine bones appear diffusely under mineralized. Probable bone graft donor site right iliac crest. IMPRESSION: 1. Liquid stool in the ascending and proximal transverse colon with equivocal wall thickening of the descending colon. Findings likely represent diarrheal illness in possible mild colitis of the descending colon. 2. Heterogeneous appearance of the liver with nodular hepatic contours, suggesting cirrhosis. 3. Small to moderate volume abdominopelvic ascites. Prominent subcutaneous body wall edema. Findings suggest third-spacing. 4. Bilateral perinephric edema, right greater than left, as well as bladder wall thickening. This may be due to underlying chronic renal disease, however possibility of urinary tract infection is raised. Recommend correlation with urinalysis. 5. Multiple rounded low-density lesions scattered throughout the spleen, largest measuring 18 mm in the upper and lower spleen. These are nonspecific and may be cysts or hemangiomas. 6. Small bilateral pleural effusions, minimally increased from prior exam. 7. Indeterminate 11 mm left adrenal nodule. Aortic Atherosclerosis (ICD10-I70.0). Electronically Signed   By: MKeith RakeM.D.   On: 03/29/2021 15:47   [place]  Result Date:  03/16/2021 CLINICAL DATA:  Acute kidney injury, abnormal labs today EXAM: RENAL / URINARY TRACT ULTRASOUND COMPLETE COMPARISON:  Renal ultrasound 10/10/2020 FINDINGS: Right Kidney: Renal measurements: 13.2 x 5.7 x 5.4 cm = volume: 213.6 mL. Echogenicity within normal limits. No mass or hydronephrosis visualized. Left Kidney: Renal measurements: 11.7 x 6.7 x 5.1 cm = volume: 209.1 mL. Limited visualization, however visualized portions demonstrate normal echogenicity without hydronephrosis. Bladder: Appears normal for degree of bladder distention. Bilateral ureteral jets are seen. Other: None. IMPRESSION: No hydronephrosis. Electronically Signed   By: Maurine Simmering   On: 03/16/2021 08:45       Discharge Exam: Vitals:   04/02/21 0845 04/02/21 1351  BP: (!) 117/55 (!) 114/59  Pulse: 61 (!) 59  Resp: 18 20  Temp:  98.2 F (36.8 C)  SpO2: 94% 98%   Vitals:   04/02/21 0545 04/02/21 0735 04/02/21 0845 04/02/21 1351  BP: 110/60 (!) 109/58 (!) 117/55 (!) 114/59  Pulse:  (!) 51 61 (!) 59  Resp:  '19 18 20  ' Temp:  98 F (36.7 C)  98.2 F (36.8 C)  TempSrc:  Oral  Oral  SpO2:  94% 94% 98%  Weight:      Height:        General: Pt is alert, awake, not in acute distress Cardiovascular: RRR, S1/S2 +, no rubs, no gallops Respiratory: CTA bilaterally, no wheezing, no rhonchi Abdominal: Soft, NT, ND, bowel sounds + Extremities: Bilateral AKA    The results of significant diagnostics from this hospitalization (including imaging, microbiology, ancillary and laboratory) are listed below for reference.     Microbiology: Recent Results (from the past 240 hour(s))  Resp Panel by RT-PCR (Flu A&B, Covid) Nasopharyngeal Swab     Status: Abnormal   Collection Time: 03/28/21  7:22 PM   Specimen: Nasopharyngeal Swab; Nasopharyngeal(NP) swabs in vial transport medium  Result Value Ref Range Status   SARS Coronavirus 2 by RT PCR POSITIVE (A) NEGATIVE Final    Comment: RESULT CALLED TO, READ BACK BY AND VERIFIED WITH: WATLINGTON,K RN '@2114'  03/28/21 BILLINGSLEY,L (NOTE) SARS-CoV-2 target nucleic acids are DETECTED.  The SARS-CoV-2 RNA is generally detectable in upper respiratory specimens during the acute phase of infection. Positive results are indicative of the presence of the identified virus, but do not rule out bacterial infection or co-infection with other pathogens not detected by the test. Clinical correlation with patient history and other diagnostic information is necessary to determine patient infection status. The expected result is Negative.  Fact Sheet for Patients: EntrepreneurPulse.com.au  Fact Sheet for Healthcare  Providers: IncredibleEmployment.be  This test is not yet approved or cleared by the [place] FDA and  has been authorized for detection and/or diagnosis of SARS-CoV-2 by FDA under an Emergency Use Authorization (EUA).  This EUA will remain in effect (meaning this t est can be used) for the duration of  the COVID-19 declaration under Section 564(b)(1) of the Act, 21 U.S.C. section 360bbb-3(b)(1), unless the authorization is terminated or revoked sooner.     Influenza A by PCR NEGATIVE NEGATIVE Final   Influenza B by PCR NEGATIVE NEGATIVE Final    Comment: (NOTE) The Xpert Xpress SARS-CoV-2/FLU/RSV plus assay is intended as an aid in the diagnosis of influenza from Nasopharyngeal swab specimens and should not be used as a sole basis for treatment. Nasal washings and aspirates are unacceptable for Xpert Xpress SARS-CoV-2/FLU/RSV testing.  Fact Sheet for Patients: EntrepreneurPulse.com.au  Fact Sheet for Healthcare Providers: IncredibleEmployment.be  This test is  not yet approved or cleared by the [place] and has been authorized for detection and/or diagnosis of SARS-CoV-2 by FDA under an Emergency Use Authorization (EUA). This EUA will remain in effect (meaning this test can be used) for the duration of the COVID-19 declaration under Section 564(b)(1) of the Act, 21 U.S.C. section 360bbb-3(b)(1), unless the authorization is terminated or revoked.  Performed at [facility], [address], [place], [place] [address]   C Difficile Quick Screen w PCR reflex     Status: None   Collection Time: 03/29/21  8:46 AM   Specimen: STOOL  Result Value Ref Range Status   C Diff antigen NEGATIVE NEGATIVE Final   C Diff toxin NEGATIVE NEGATIVE Final   C Diff interpretation No C. difficile detected.  Final    Comment: NEGATIVE Performed at [facility], [address], [place], [place] [address]   Gastrointestinal Panel by  PCR , Stool     Status: None   Collection Time: 03/29/21  8:46 AM   Specimen: STOOL  Result Value Ref Range Status   Campylobacter species NOT DETECTED NOT DETECTED Final   Plesimonas shigelloides NOT DETECTED NOT DETECTED Final   Salmonella species NOT DETECTED NOT DETECTED Final   Yersinia enterocolitica NOT DETECTED NOT DETECTED Final   Vibrio species NOT DETECTED NOT DETECTED Final   Vibrio cholerae NOT DETECTED NOT DETECTED Final   Enteroaggregative E coli (EAEC) NOT DETECTED NOT DETECTED Final   Enteropathogenic E coli (EPEC) NOT DETECTED NOT DETECTED Final   Enterotoxigenic E coli (ETEC) NOT DETECTED NOT DETECTED Final   Shiga like toxin producing E coli (STEC) NOT DETECTED NOT DETECTED Final   Shigella/Enteroinvasive E coli (EIEC) NOT DETECTED NOT DETECTED Final   Cryptosporidium NOT DETECTED NOT DETECTED Final   Cyclospora cayetanensis NOT DETECTED NOT DETECTED Final   Entamoeba histolytica NOT DETECTED NOT DETECTED Final   Giardia lamblia NOT DETECTED NOT DETECTED Final   Adenovirus F40/41 NOT DETECTED NOT DETECTED Final   Astrovirus NOT DETECTED NOT DETECTED Final   Norovirus GI/GII NOT DETECTED NOT DETECTED Final   Rotavirus A NOT DETECTED NOT DETECTED Final   Sapovirus (I, II, IV, and V) NOT DETECTED NOT DETECTED Final    Comment: Performed at [facility], [address] [place], [place] [address]     Labs: BNP (last 3 results) Recent Labs    11/29/20 1730 12/01/20 0605  BNP 2,629.0* 6,861.6*   Basic Metabolic Panel: Recent Labs  Lab 03/28/21 1700 03/29/21 0350 03/30/21 0611 03/31/21 0553 04/01/21 0456 04/02/21 0538  NA  --  136 133* 136 134* 135  K  --  3.6 3.7 3.9 4.0 4.1  CL  --  109 104 108 108 107  CO2  --  22 24 21* 20* 19*  GLUCOSE  --  94 88 103* 113* 113*  BUN  --  '6 11 14 19 ' 31*  CREATININE  --  1.24* 1.27* 1.41* 1.53* 1.64*  CALCIUM  --  6.6* 6.6* 7.1* 7.3* 7.3*  MG 1.0* 1.8  --  1.7 1.5*  --   PHOS  --   --  2.3* 3.1 3.2  --     Liver Function Tests: Recent Labs  Lab 03/28/21 1420 03/29/21 0350 03/30/21 0611 03/31/21 0553 04/01/21 0456 04/02/21 0538  AST 11* 10*  --  9* 10* 11*  ALT 11 10  --  '8 8 10  ' ALKPHOS 92 91  --  99 88 90  BILITOT 0.5  0.4  --  0.5 0.7 0.5  PROT 5.3* 5.3*  --  5.6* 5.1* 5.3*  ALBUMIN 2.1* 2.0* 1.8* 2.1* 1.9* 2.0*   Recent Labs  Lab 03/28/21 1420  LIPASE 19   No results for input(s): AMMONIA in the last 168 hours. CBC: Recent Labs  Lab 03/28/21 1420 03/29/21 0350 03/30/21 0611 03/31/21 0553 04/01/21 0456 04/02/21 0538  WBC 5.7 5.3 6.2 7.2 8.0 10.7*  NEUTROABS 4.8  --   --  5.7 6.0 9.3*  HGB 9.4* 9.2* 8.5* 10.4* 9.2* 9.2*  HCT 30.3* 30.6* 28.6* 34.7* 31.0* 31.5*  MCV 84.9 86.0 85.9 85.5 86.6 86.3  PLT 172 148* 144* 185 173 194   Cardiac Enzymes: No results for input(s): CKTOTAL, CKMB, CKMBINDEX, TROPONINI in the last 168 hours. BNP: Invalid input(s): POCBNP CBG: Recent Labs  Lab 04/01/21 1101 04/01/21 1626 04/01/21 2126 04/02/21 0731 04/02/21 1105  GLUCAP 129* 134* 83 147* 123*   D-Dimer Recent Labs    03/31/21 0553 04/01/21 0456  DDIMER 2.90* 3.03*   Hgb A1c No results for input(s): HGBA1C in the last 72 hours. Lipid Profile No results for input(s): CHOL, HDL, LDLCALC, TRIG, CHOLHDL, LDLDIRECT in the last 72 hours. Thyroid function studies No results for input(s): TSH, T4TOTAL, T3FREE, THYROIDAB in the last 72 hours.  Invalid input(s): FREET3 Anemia work up Recent Labs    03/31/21 0553 04/01/21 1303  FERRITIN 101 85   Urinalysis    Component Value Date/Time   COLORURINE YELLOW 04/02/2021 1145   APPEARANCEUR TURBID (A) 04/02/2021 1145   LABSPEC 1.008 04/02/2021 1145   PHURINE 5.0 04/02/2021 1145   GLUCOSEU NEGATIVE 04/02/2021 1145   HGBUR MODERATE (A) 04/02/2021 1145   BILIRUBINUR NEGATIVE 04/02/2021 1145   KETONESUR NEGATIVE 04/02/2021 1145   PROTEINUR 100 (A) 04/02/2021 1145   UROBILINOGEN 0.2 06/25/2011 1707   NITRITE POSITIVE  (A) 04/02/2021 1145   LEUKOCYTESUR LARGE (A) 04/02/2021 1145   Sepsis Labs Invalid input(s): PROCALCITONIN,  WBC,  LACTICIDVEN Microbiology Recent Results (from the past 240 hour(s))  Resp Panel by RT-PCR (Flu A&B, Covid) Nasopharyngeal Swab     Status: Abnormal   Collection Time: 03/28/21  7:22 PM   Specimen: Nasopharyngeal Swab; Nasopharyngeal(NP) swabs in vial transport medium  Result Value Ref Range Status   SARS Coronavirus 2 by RT PCR POSITIVE (A) NEGATIVE Final    Comment: RESULT CALLED TO, READ BACK BY AND VERIFIED WITH: WATLINGTON,K RN '@2114'  03/28/21 BILLINGSLEY,L (NOTE) SARS-CoV-2 target nucleic acids are DETECTED.  The SARS-CoV-2 RNA is generally detectable in upper respiratory specimens during the acute phase of infection. Positive results are indicative of the presence of the identified virus, but do not rule out bacterial infection or co-infection with other pathogens not detected by the test. Clinical correlation with patient history and other diagnostic information is necessary to determine patient infection status. The expected result is Negative.  Fact Sheet for Patients: EntrepreneurPulse.com.au  Fact Sheet for Healthcare Providers: IncredibleEmployment.be  This test is not yet approved or cleared by the [place] FDA and  has been authorized for detection and/or diagnosis of SARS-CoV-2 by FDA under an Emergency Use Authorization (EUA).  This EUA will remain in effect (meaning this t est can be used) for the duration of  the COVID-19 declaration under Section 564(b)(1) of the Act, 21 U.S.C. section 360bbb-3(b)(1), unless the authorization is terminated or revoked sooner.     Influenza A by PCR NEGATIVE NEGATIVE Final   Influenza B by PCR NEGATIVE NEGATIVE Final  Comment: (NOTE) The Xpert Xpress SARS-CoV-2/FLU/RSV plus assay is intended as an aid in the diagnosis of influenza from Nasopharyngeal swab specimens  and should not be used as a sole basis for treatment. Nasal washings and aspirates are unacceptable for Xpert Xpress SARS-CoV-2/FLU/RSV testing.  Fact Sheet for Patients: EntrepreneurPulse.com.au  Fact Sheet for Healthcare Providers: IncredibleEmployment.be  This test is not yet approved or cleared by the [place] FDA and has been authorized for detection and/or diagnosis of SARS-CoV-2 by FDA under an Emergency Use Authorization (EUA). This EUA will remain in effect (meaning this test can be used) for the duration of the COVID-19 declaration under Section 564(b)(1) of the Act, 21 U.S.C. section 360bbb-3(b)(1), unless the authorization is terminated or revoked.  Performed at [facility], [address], [place], [place] [address]   C Difficile Quick Screen w PCR reflex     Status: None   Collection Time: 03/29/21  8:46 AM   Specimen: STOOL  Result Value Ref Range Status   C Diff antigen NEGATIVE NEGATIVE Final   C Diff toxin NEGATIVE NEGATIVE Final   C Diff interpretation No C. difficile detected.  Final    Comment: NEGATIVE Performed at [facility], [address] [place], [place] [address]   Gastrointestinal Panel by PCR , Stool     Status: None   Collection Time: 03/29/21  8:46 AM   Specimen: STOOL  Result Value Ref Range Status   Campylobacter species NOT DETECTED NOT DETECTED Final   Plesimonas shigelloides NOT DETECTED NOT DETECTED Final   Salmonella species NOT DETECTED NOT DETECTED Final   Yersinia enterocolitica NOT DETECTED NOT DETECTED Final   Vibrio species NOT DETECTED NOT DETECTED Final   Vibrio cholerae NOT DETECTED NOT DETECTED Final   Enteroaggregative E coli (EAEC) NOT DETECTED NOT DETECTED Final   Enteropathogenic E coli (EPEC) NOT DETECTED NOT DETECTED Final   Enterotoxigenic E coli (ETEC) NOT DETECTED NOT DETECTED Final   Shiga like toxin producing E coli (STEC) NOT DETECTED NOT DETECTED Final    Shigella/Enteroinvasive E coli (EIEC) NOT DETECTED NOT DETECTED Final   Cryptosporidium NOT DETECTED NOT DETECTED Final   Cyclospora cayetanensis NOT DETECTED NOT DETECTED Final   Entamoeba histolytica NOT DETECTED NOT DETECTED Final   Giardia lamblia NOT DETECTED NOT DETECTED Final   Adenovirus F40/41 NOT DETECTED NOT DETECTED Final   Astrovirus NOT DETECTED NOT DETECTED Final   Norovirus GI/GII NOT DETECTED NOT DETECTED Final   Rotavirus A NOT DETECTED NOT DETECTED Final   Sapovirus (I, II, IV, and V) NOT DETECTED NOT DETECTED Final    Comment: Performed at [facility], [address], [place], [place] [address]     Time coordinating discharge: 35 minutes  SIGNED:   Rodena Goldmann, DO [name] 04/02/2021, 3:27 PM  If 7PM-7AM, please contact night-coverage [email or web address]

## 2021-04-02 NOTE — Progress Notes (Signed)
    Subjective: States 3 soft stools "during eating hours" yesterday and one overnight. One this morning per nursing staff that was "mushy/pudding-like" and very "thick". No diarrhea. Frequency has improved. No abdominal pain.   Objective: Vital signs in last 24 hours: Temp:  [98 F (36.7 C)-98.6 F (37 C)] 98 F (36.7 C) (05/16 0735) Pulse Rate:  [51-66] 61 (05/16 0845) Resp:  [18-19] 18 (05/16 0845) BP: (78-117)/(41-60) 117/55 (05/16 0845) SpO2:  [94 %-100 %] 94 % (05/16 0845) Last BM Date: 04/01/21 General:   Alert and oriented, pleasant Head:  Normocephalic and atraumatic. Abdomen:  Bowel sounds present, soft, non-tender, non-distended. No HSM or hernias noted. No rebound or guarding. No masses appreciated  Extremities:  With right BKA and left AKA  Neurologic:  Alert and  oriented x4  Intake/Output from previous day: 05/15 0701 - 05/16 0700 In: 1187.4 [P.O.:720; I.V.:267.4; IV Piggyback:200] Out: 200 [Urine:200] Intake/Output this shift: Total I/O In: -  Out: 100 [Urine:100]  Lab Results: Recent Labs    03/31/21 0553 04/01/21 0456 04/02/21 0538  WBC 7.2 8.0 10.7*  HGB 10.4* 9.2* 9.2*  HCT 34.7* 31.0* 31.5*  PLT 185 173 194   BMET Recent Labs    03/31/21 0553 04/01/21 0456 04/02/21 0538  NA 136 134* 135  K 3.9 4.0 4.1  CL 108 108 107  CO2 21* 20* 19*  GLUCOSE 103* 113* 113*  BUN 14 19 31*  CREATININE 1.41* 1.53* 1.64*  CALCIUM 7.1* 7.3* 7.3*   LFT Recent Labs    03/31/21 0553 04/01/21 0456 04/02/21 0538  PROT 5.6* 5.1* 5.3*  ALBUMIN 2.1* 1.9* 2.0*  AST 9* 10* 11*  ALT 8 8 10   ALKPHOS 99 88 90  BILITOT 0.5 0.7 0.5    Assessment: 55 year old female with multiple comorbidities, recent admission for severe electrolyte abnormalities in the setting of chronic diarrhea and vomiting presenting back again with persistent symptoms. Found to be Covid positive on admission.   Chronic diarrhea: thus far, colonoscopy, EGD, unrevealing for etiology.  Stool studies (GI path panel and Cdiff) negative. Celiac work-up negative. Fecal lactoferrin positive. Further studies including pancreatic elastase, stool osmolality, 24 hour urine 5HIAA pending. Clinically, she has improved with stool frequency and consistency on Lomotil 1 tablet TID, octreotide 50 mcg BID, and addition of Questran 4 grams BID.    Plan: Continue with current regimen of Lomotil, octreotide, and Questran Pending stool studies and urine would not preclude discharge home (as long as 24 hour collection complete) as may take several days to receive results Hopeful discharge in near future  Will sign off; please contact if further assistance needed. Will arrange outpatient visit   Annitta Needs, PhD, ANP-BC [facility]    LOS: 4 days    04/02/2021, 10:31 AM

## 2021-04-02 NOTE — TOC Transition Note (Addendum)
Transition of Care [facility]) - CM/SW Discharge Note   Patient Details  Name: BRYNLEI KLAUSNER MRN: [id number] Date of Birth: 02/17/1966  Transition of Care [facility]) CM/SW Contact:  Ihor Gully, LCSW Phone Number: 04/02/2021, 2:15 PM   Clinical Narrative:    Patient's hospital bed is scheduled to be delivered today. Patient wants electric wc. [name] Munro with [employer] stated that patient was provided a manual wc in 2019. If an electric wc was needed, patient's PCP could complete required paperwork. Patient advised and indicated that she would contact PCP regarding paperwork.  Per palliative NP, patient needs referral to palliative services. Discussed palliative providers. Referral made to [facility] Palliative through [facility].     Final next level of care: Home/Self Care Barriers to Discharge: No Barriers Identified   Patient Goals and CMS Choice        Discharge Placement                       Discharge Plan and Services                                     Social Determinants of Health (SDOH) Interventions     Readmission Risk Interventions Readmission Risk Prevention Plan 03/15/2021 12/02/2020 11/30/2020  Transportation Screening Complete Complete Complete  PCP or Specialist Appt within 5-7 Days - - -  PCP or Specialist Appt within 3-5 Days - Complete -  Home Care Screening - - -  Medication Review (RN CM) - - -  [place] or [place] - Complete Complete  Social Work Consult for [place] Planning/Counseling - Complete Complete  Palliative Care Screening - Not Applicable Not Applicable  Medication Review Press photographer) Complete Complete Complete  HRI or Home Care Consult Complete - -  SW Recovery Care/Counseling Consult Complete - -  Palliative Care Screening Not Applicable - -  [place] Not Applicable - -  Some recent data might be hidden

## 2021-04-02 NOTE — Progress Notes (Addendum)
Palliative-   Patient admitted on 03/28/2021 with diarrhea. Workup revealed COVID +, electrolyte derangement. 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 She has been stabilized and is readying for discharge today.   Discussed patient with RN and Dr. Manuella Ghazi. Per RN sounds as though there is some significant caregiver burden.  She has no documented advanced care planning.   Recommend outpatient Palliative consult in her home for advanced care planning, and recommendations for improved quality of life and caregiver support.  Spoke with patient's spouse and he is in agreement to referral.  Transition of care order sent for referral.  Mariana Kaufman, AGNP-C Palliative Medicine  No charge note

## 2021-04-02 NOTE — Progress Notes (Signed)
Pt discharged via WC to POV. Using pt's slide board, husband and staff transferred pt from [facility] into truck. Pt's belongings with her at discharged, given to her husband to place in vehicle.

## 2021-04-02 NOTE — Progress Notes (Signed)
Spoke with pt's husband [name], who states he will be coming to pick pt up for discharge and will bring her clothes to wear home. Pt notified and agreeable. Foley catheter care & cleaning demonstrated to patient. Pt able to give good verbal and return demonstration. Pt advised that Urologist's office will be calling her for outpatient appointment to follow up on foley catheter and to decide when foley will be removed. She states understanding.

## 2021-04-03 ENCOUNTER — Emergency Department (HOSPITAL_COMMUNITY): Payer: Medicare Other

## 2021-04-03 ENCOUNTER — Other Ambulatory Visit: Payer: Self-pay

## 2021-04-03 ENCOUNTER — Emergency Department (HOSPITAL_COMMUNITY)
Admission: EM | Admit: 2021-04-03 | Discharge: 2021-04-04 | Disposition: A | Discharge status: Home / Self Care | Payer: Medicare Other | Attending: Emergency Medicine | Admitting: Emergency Medicine

## 2021-04-03 ENCOUNTER — Encounter (HOSPITAL_COMMUNITY): Payer: Self-pay | Admitting: Emergency Medicine

## 2021-04-03 DIAGNOSIS — J45909 Unspecified asthma, uncomplicated: Secondary | ICD-10-CM | POA: Insufficient documentation

## 2021-04-03 DIAGNOSIS — Z7901 Long term (current) use of anticoagulants: Secondary | ICD-10-CM | POA: Insufficient documentation

## 2021-04-03 DIAGNOSIS — U071 COVID-19: Secondary | ICD-10-CM | POA: Insufficient documentation

## 2021-04-03 DIAGNOSIS — I130 Hypertensive heart and chronic kidney disease with heart failure and stage 1 through stage 4 chronic kidney disease, or unspecified chronic kidney disease: Secondary | ICD-10-CM | POA: Insufficient documentation

## 2021-04-03 DIAGNOSIS — Z951 Presence of aortocoronary bypass graft: Secondary | ICD-10-CM | POA: Diagnosis not present

## 2021-04-03 DIAGNOSIS — I2510 Atherosclerotic heart disease of native coronary artery without angina pectoris: Secondary | ICD-10-CM | POA: Insufficient documentation

## 2021-04-03 DIAGNOSIS — N184 Chronic kidney disease, stage 4 (severe): Secondary | ICD-10-CM | POA: Diagnosis not present

## 2021-04-03 DIAGNOSIS — R197 Diarrhea, unspecified: Secondary | ICD-10-CM | POA: Diagnosis present

## 2021-04-03 DIAGNOSIS — Z79899 Other long term (current) drug therapy: Secondary | ICD-10-CM | POA: Diagnosis not present

## 2021-04-03 DIAGNOSIS — F17210 Nicotine dependence, cigarettes, uncomplicated: Secondary | ICD-10-CM | POA: Insufficient documentation

## 2021-04-03 DIAGNOSIS — I5042 Chronic combined systolic (congestive) and diastolic (congestive) heart failure: Secondary | ICD-10-CM | POA: Insufficient documentation

## 2021-04-03 DIAGNOSIS — E1122 Type 2 diabetes mellitus with diabetic chronic kidney disease: Secondary | ICD-10-CM | POA: Insufficient documentation

## 2021-04-03 DIAGNOSIS — R339 Retention of urine, unspecified: Secondary | ICD-10-CM | POA: Insufficient documentation

## 2021-04-03 DIAGNOSIS — Z89511 Acquired absence of right leg below knee: Secondary | ICD-10-CM | POA: Insufficient documentation

## 2021-04-03 DIAGNOSIS — E11319 Type 2 diabetes mellitus with unspecified diabetic retinopathy without macular edema: Secondary | ICD-10-CM | POA: Insufficient documentation

## 2021-04-03 DIAGNOSIS — Z794 Long term (current) use of insulin: Secondary | ICD-10-CM | POA: Insufficient documentation

## 2021-04-03 DIAGNOSIS — Z8616 Personal history of COVID-19: Secondary | ICD-10-CM | POA: Diagnosis not present

## 2021-04-03 DIAGNOSIS — E1140 Type 2 diabetes mellitus with diabetic neuropathy, unspecified: Secondary | ICD-10-CM | POA: Insufficient documentation

## 2021-04-03 LAB — CBC WITH DIFFERENTIAL/PLATELET
Abs Immature Granulocytes: 0.03 10*3/uL (ref 0.00–0.07)
Basophils Absolute: 0 10*3/uL (ref 0.0–0.1)
Basophils Relative: 1 %
Eosinophils Absolute: 0.1 10*3/uL (ref 0.0–0.5)
Eosinophils Relative: 1 %
HCT: 26.6 % — ABNORMAL LOW (ref 36.0–46.0)
Hemoglobin: 8.2 g/dL — ABNORMAL LOW (ref 12.0–15.0)
Immature Granulocytes: 0 %
Lymphocytes Relative: 14 %
Lymphs Abs: 1 10*3/uL (ref 0.7–4.0)
MCH: 26 pg (ref 26.0–34.0)
MCHC: 30.8 g/dL (ref 30.0–36.0)
MCV: 84.4 fL (ref 80.0–100.0)
Monocytes Absolute: 0.6 10*3/uL (ref 0.1–1.0)
Monocytes Relative: 8 %
Neutro Abs: 5.6 10*3/uL (ref 1.7–7.7)
Neutrophils Relative %: 76 %
Platelets: 198 10*3/uL (ref 150–400)
RBC: 3.15 MIL/uL — ABNORMAL LOW (ref 3.87–5.11)
RDW: 21.3 % — ABNORMAL HIGH (ref 11.5–15.5)
WBC: 7.4 10*3/uL (ref 4.0–10.5)
nRBC: 0 % (ref 0.0–0.2)

## 2021-04-03 LAB — BLOOD GAS, VENOUS
Acid-base deficit: 5.2 mmol/L — ABNORMAL HIGH (ref 0.0–2.0)
Bicarbonate: 19.3 mmol/L — ABNORMAL LOW (ref 20.0–28.0)
FIO2: 28
O2 Saturation: 55.7 %
Patient temperature: 37
pCO2, Ven: 46.1 mmHg (ref 44.0–60.0)
pH, Ven: 7.272 (ref 7.250–7.430)
pO2, Ven: 37.4 mmHg (ref 32.0–45.0)

## 2021-04-03 MED ORDER — SODIUM CHLORIDE 0.9 % IV BOLUS
500.0000 mL | Freq: Once | INTRAVENOUS | Status: AC
  Administered 2021-04-03: 500 mL via INTRAVENOUS

## 2021-04-03 NOTE — ED Triage Notes (Signed)
Pt c/o diarrhea, sob and cough she was diagnosed with covid Thursday. Pt bs was 149

## 2021-04-03 NOTE — ED Provider Notes (Signed)
[name]   CSN: [id number] Arrival date & time: 04/03/21  2137     History Chief Complaint  Patient presents with  . Diarrhea    [name] is a 55 y.o. female.  HPI     This is a 55 year old female with multiple medical problems including coronary artery disease, diabetes, atrial fibrillation, osteomyelitis, recent history of ongoing diarrhea with admission for further work-up who presents with shortness of breath and diarrhea.  Patient was just discharged from the hospital 1 day ago.  At that time she was admitted for ongoing diarrhea and metabolic derangements.  She was incidentally noted to be COVID-positive.  She did receive some remdesivir.  Patient had some urinary retention and was discharged with a Foley.  She states that since discharge she has had worsening of her diarrhea and "it is like before I got admitted."  Additionally she reports shortness of breath.  No chest pain or fever.  She states that she just feels poorly.  Past Medical History:  Diagnosis Date  . Abnormal stress test    a. 02/2017 MV: large region of fixed perfusion defect in basal to mid inf, mid-dist inflat walls, EF 43%. No ischemia (EF 55-60% by f/u echo).  . Arthritis   . Asthma   . Carotid arterial disease ([place])    a. 09/2017 Carotid U/S: 40-49% bilat ICA stenosis.  . Chronic back pain   . Coronary artery calcification seen on CT scan    a. 11/2017 CT Abd/Pelvis: Multi vessel coronary vascular Ca2+.  . Depression   . Diabetes mellitus   . Diabetic neuropathy ([place])   . Difficult intubation    DIFFICULT AIRWAY/FYI  . Family history of adverse reaction to anesthesia    mother had difficlty waking   . Femoral-popliteal bypass graft occlusion, left ([place]) 12/02/2017  . GERD (gastroesophageal reflux disease)   . History of echocardiogram    a. 03/2017 Echo: EF 55-60%, mild LVH, nl RV fxn.  . Hyperlipidemia   . Ischemic cardiomyopathy    a. 04/2018 Echo: EF  30-35%, Gr2 DD, mild LVH. Mild MR. Mildly dil LA, mod dil RV w/ mod red RV fxn, mild TR, PASP 53mmHg.  . NSTEMI (non-ST elevated myocardial infarction) ([place])    a. 05/2018 in setting of Afib, sepsis, and post-op L AKA. Peak trop 7.7. EF 30-35% by echo-->cath not performed 2/2 renal failure.  . Osteomyelitis of right fibula ([place]) 03/05/2017  . PAD (peripheral artery disease) ([place])    a. S/p L fem-pop bypass; b. 11/2017 s/p Aortobifem bypass 2/2 graft occlusion; c. 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  . Paroxysmal atrial fibrillation with rapid ventricular response ([place]) 12/02/2017   a. CHA2DS2VASc = 3-->Xarelto; b. 05/2018 Recurrent Afib-->amio.  Marland Kitchen Renal insufficiency   . Ulcer    Foot    Patient Active Problem List   Diagnosis Date Noted  . Acute urinary retention 03/30/2021  . CKD (chronic kidney disease) stage 3A, GFR 30-59 ml/min  03/30/2021  . COVID-19 virus infection 03/29/2021  . Hypokalemia due to excessive gastrointestinal loss of potassium 03/29/2021  . Hypomagnesemia 03/29/2021  . Diarrhea   . Non-intractable vomiting   . Left lower quadrant abdominal pain   . Hypokalemia 03/09/2021  . Electrolyte abnormality 03/08/2021  . S/P bilateral above knee amputation ([place]) 03/08/2021  . 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 CKD stage 4  due to type 2 diabetes mellitus ([place]) 12/05/2020  . Hyperlipidemia associated with type 2 diabetes mellitus ([place]) 12/05/2020  . Controlled type 2 diabetes mellitus with stage 4 chronic kidney disease, with long-term current use of insulin ([place]) 12/05/2020  . S/P BKA (below knee amputation) unilateral, right ([place]) 12/05/2020  . Anemia due to stage 4 chronic kidney disease ([place]) 12/05/2020  . Leukopenia 11/29/2020  . Elevated brain natriuretic peptide (BNP) level 11/29/2020  . Elevated d-dimer 11/29/2020  . Prolonged QT interval 11/29/2020  . Stable treated  proliferative diabetic retinopathy of left eye determined by examination associated with type 2 diabetes mellitus ([place]) 08/14/2020  . Macular pucker, left eye 02/24/2020  . Follow-up examination after eye surgery 02/24/2020  . Coronary artery disease involving native coronary artery of native heart without angina pectoris 11/21/2018  . Ischemic cardiomyopathy 11/21/2018  . Essential hypertension 11/21/2018  . Labile blood pressure   . Diabetic retinopathy associated with type 2 diabetes mellitus ([place])   . Hypoalbuminemia due to protein-calorie malnutrition ([place])   . Type 2 diabetes mellitus with peripheral neuropathy (H[facility])   . Chronic combined systolic (congestive) and diastolic (congestive) heart failure ([place])   . Unilateral AKA, left ([place])   . Atrial fibrillation with rapid ventricular response ([place])   . Diabetic peripheral neuropathy ([place])   . Anemia of chronic disease   . Gastroesophageal reflux disease   . Non-ST elevation (NSTEMI) myocardial infarction ([place])   . Acute combined systolic and diastolic heart failure ([place])   . Acute respiratory failure with hypoxia ([place])   . Microcytic anemia 04/25/2018  . Pulmonary nodule 04/25/2018  . Gangrene of lower extremity ([place]) 04/25/2018  . Femoral-popliteal bypass graft occlusion, left ([place]) 12/02/2017  . Respiratory failure with hypoxia ([place]) 12/02/2017  . Leukocytosis 12/02/2017  . Paroxysmal atrial fibrillation with rapid ventricular response ([place]) 12/02/2017  . Hyperkalemia 06/26/2017  . Elevated lactic acid level   . Chronic back pain 11/27/2016  . Hyperlipidemia LDL goal <70   . Retinal tumor of right eye   . 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 Paresthesia of both hands 03/05/2016  . DDD (degenerative disc disease), lumbosacral 03/26/2015  . DDD (degenerative disc disease), cervical 03/26/2015  . Sacroiliac joint disease 03/26/2015  . Occipital neuralgia 03/26/2015  . Pain in limb-Bilateral Leg 04/25/2014  .  Peripheral vascular disease ([place]) 04/05/2013  . Chronic total occlusion of artery of the extremities ([place]) 02/10/2012    Past Surgical History:  Procedure Laterality Date  . ABDOMINAL AORTAGRAM  June 15, 2014  . ABDOMINAL AORTAGRAM N/A 06/15/2014   Procedure: ABDOMINAL Maxcine Ham;  Surgeon: Serafina Mitchell, MD;  [facility] CATH LAB;  Service: Cardiovascular;  Laterality: N/A;  . ABDOMINAL AORTAGRAM N/A 11/22/2014   Procedure: ABDOMINAL AORTAGRAM;  Surgeon: Serafina Mitchell, MD;  Location: [facility] CATH LAB;  Service: Cardiovascular;  Laterality: N/A;  . ABDOMINAL AORTOGRAM W/LOWER EXTREMITY N/A 01/07/2017   Procedure: Abdominal Aortogram w/Lower Extremity;  Surgeon: Serafina Mitchell, MD;  Location: [place] CV LAB;  Service: Cardiovascular;  Laterality: N/A;  . ABDOMINAL AORTOGRAM W/LOWER EXTREMITY N/A 10/31/2017   Procedure: ABDOMINAL AORTOGRAM W/LOWER EXTREMITY;  Surgeon: Elam Dutch, MD;  Location: [place] CV LAB;  Service: Cardiovascular;  Laterality: N/A;  . ABDOMINAL AORTOGRAM W/LOWER EXTREMITY N/A 03/24/2018   Procedure: ABDOMINAL AORTOGRAM W/LOWER EXTREMITY;  Surgeon: Serafina Mitchell, MD;  Location: [place] CV LAB;  Service: Cardiovascular;  Laterality: N/A;  . AMPUTATION Left 04/26/2018   Procedure: AMPUTATION ABOVE  KNEE;  Surgeon: Elam Dutch, MD;  Location: [place];  Service: Vascular;  Laterality: Left;  . AORTA - BILATERAL FEMORAL ARTERY BYPASS GRAFT N/A 11/28/2017   Procedure: AORTA BIFEMORAL BYPASS USING HEMASHIELD GOLD GRAFT & REIMPLANT IMA;  Surgeon: Serafina Mitchell, MD;  Location: [facility] OR;  Service: Vascular;  Laterality: N/A;  . AORTIC ARCH ANGIOGRAPHY N/A 10/31/2017   Procedure: AORTIC ARCH ANGIOGRAPHY;  Surgeon: Elam Dutch, MD;  Location: [place] CV LAB;  Service: Cardiovascular;  Laterality: N/A;  . APPLICATION OF WOUND VAC  11/28/2017   Procedure: APPLICATION OF WOUND VAC;  Surgeon: Serafina Mitchell, MD;  Location: [facility] OR;  Service: Vascular;;  .  APPLICATION OF WOUND VAC Left 03/27/2018   Procedure: APPLICATION OF WOUND VAC LEFT GROIN USING PREVENA PLUS;  Surgeon: Serafina Mitchell, MD;  Location: [place];  Service: Vascular;  Laterality: Left;  . ARTERIAL BYPASS SURGERY   07/05/2010   Right Common Femoral to below knee popliteal BPG  . BACK SURGERY     X's  2  . BIOPSY  03/13/2021   Procedure: BIOPSY;  Surgeon: Harvel Quale, MD;  [facility];  Service: Gastroenterology;;  . CARDIAC CATHETERIZATION    . CHOLECYSTECTOMY     Gall Bladder  . COLONOSCOPY WITH PROPOFOL N/A 03/13/2021   Procedure: COLONOSCOPY WITH PROPOFOL;  Surgeon: Harvel Quale, MD;  [facility];  Service: Gastroenterology;  Laterality: N/A;  with random colonic biopsies  . CYSTECTOMY Left    wrist  . EMBOLECTOMY Left 11/28/2017   Procedure: Left Lower Extremity Embolectomy, Left Tibial Peroneal Trunk Endarterectomy with Patch Angioplasty; Vein Harvest Small Saphenous Graft Left Lower Leg;  Surgeon: [name], MD;  Location: [place];  Service: Vascular;  Laterality: Left;  . ESOPHAGOGASTRODUODENOSCOPY (EGD) WITH PROPOFOL N/A 03/13/2021   Procedure: ESOPHAGOGASTRODUODENOSCOPY (EGD) WITH PROPOFOL;  Surgeon: Harvel Quale, MD;  [facility];  Service: Gastroenterology;  Laterality: N/A;  . EYE SURGERY Left 02/23/2020   Dr. Zadie Rhine  . FEMORAL-POPLITEAL BYPASS GRAFT Left 03/27/2018   Procedure: THROMBECTOMY OF LEFT FEMORAL TIBIAL BYPASS;  Surgeon: Serafina Mitchell, MD;  Location: [place];  Service: Vascular;  Laterality: Left;  . FEMORAL-TIBIAL BYPASS GRAFT Left 03/27/2018   Procedure: BYPASS GRAFT FEMORAL-TIBIAL ARTERY LEFT REDO USING CRYOPRESERVED SAPHENOUS VEIN 70cm;  Surgeon: Serafina Mitchell, MD;  Location: [facility] OR;  Service: Vascular;  Laterality: Left;  . INTERCOSTAL NERVE BLOCK  November 2015  . INTRAOPERATIVE ARTERIOGRAM  11/28/2017   Procedure: INTRA OPERATIVE ARTERIOGRAM OF LEFT LEG;  Surgeon:  Serafina Mitchell, MD;  Location: [place];  Service: Vascular;;  . INTRAOPERATIVE ARTERIOGRAM Left 03/27/2018   Procedure: INTRA OPERATIVE ARTERIOGRAM TIMES TWO;  Surgeon: Serafina Mitchell, MD;  Location: [place];  Service: Vascular;  Laterality: Left;  . IR FLUORO GUIDE CV LINE RIGHT  03/20/2017  . IR FLUORO GUIDE CV LINE RIGHT  05/05/2018  . IR REMOVAL TUN CV CATH W/O FL  05/20/2018  . IR US GUIDE VASC ACCESS RIGHT  03/20/2017  . IR US GUIDE VASC ACCESS RIGHT  05/05/2018  . IRRIGATION AND DEBRIDEMENT BUTTOCKS Right 09/30/2016   Procedure: DEBRIDEMENT RIGHT  BUTTOCK WOUND;  Surgeon: Georganna Skeans, MD;  Location: [place];  Service: General;  Laterality: Right;  . left foot surgery    . PERIPHERAL VASCULAR CATHETERIZATION N/A 05/07/2016   Procedure: Abdominal Aortogram;  Surgeon: Serafina Mitchell, MD;  Location: [place] CV LAB;  Service:  Cardiovascular;  Laterality: N/A;  . PERIPHERAL VASCULAR CATHETERIZATION N/A 05/07/2016   Procedure: Lower Extremity Angiography;  Surgeon: Serafina Mitchell, MD;  Location: [place] CV LAB;  Service: Cardiovascular;  Laterality: N/A;  . PERIPHERAL VASCULAR CATHETERIZATION N/A 05/07/2016   Procedure: Aortic Arch Angiography;  Surgeon: Serafina Mitchell, MD;  Location: [place] CV LAB;  Service: Cardiovascular;  Laterality: N/A;  . PERIPHERAL VASCULAR CATHETERIZATION N/A 05/07/2016   Procedure: Upper Extremity Angiography;  Surgeon: Serafina Mitchell, MD;  Location: [place] CV LAB;  Service: Cardiovascular;  Laterality: N/A;  . PERIPHERAL VASCULAR CATHETERIZATION Right 05/07/2016   Procedure: Peripheral Vascular Balloon Angioplasty;  Surgeon: Serafina Mitchell, MD;  Location: [place] CV LAB;  Service: Cardiovascular;  Laterality: Right;  subclavian  . PERIPHERAL VASCULAR CATHETERIZATION Right 05/07/2016   Procedure: Peripheral Vascular Intervention;  Surgeon: Serafina Mitchell, MD;  Location: [place] CV LAB;  Service: Cardiovascular;  Laterality: Right;  External  Iliac   . POLYPECTOMY  03/13/2021   Procedure: POLYPECTOMY;  Surgeon: Harvel Quale, MD;  [facility];  Service: Gastroenterology;;  . SKIN GRAFT Right 2012   [name] by Dr. Nils Pyle- Right and Left Ankle  . SPINE SURGERY    . THROMBECTOMY FEMORAL ARTERY  11/28/2017   Procedure: THROMBECTOMY  & REVISION OF BILATERAL FEMORAL TO POPLETEAL ARTERIES;  Surgeon: Serafina Mitchell, MD;  Location: [place];  Service: Vascular;;  . TONSILLECTOMY    . TRANSMETATARSAL AMPUTATION Right 10/07/2020   Procedure: RIGHT BELOW KNEE AMPUTATION;  Surgeon: Erle Crocker, MD;  Location: [place];  Service: Orthopedics;  Laterality: Right;     OB History   No obstetric history on file.     Family History  Problem Relation Age of Onset  . Coronary artery disease Mother   . Peripheral vascular disease Mother   . Heart disease Mother        Before age 60  . Other Mother        Venous insuffiency  . Diabetes Mother   . Hyperlipidemia Mother   . Hypertension Mother   . Varicose Veins Mother   . Heart attack Mother        before age 65  . Heart disease Father   . Diabetes Father   . Diabetes Maternal Grandmother   . Diabetes Paternal Grandmother   . Diabetes Paternal Grandfather   . Diabetes Sister   . Hypertension Sister   . Diabetes Brother   . Hypertension Brother     Social History   Tobacco Use  . Smoking status: Current Every Day Smoker    Packs/day: 0.50    Years: 30.00    Pack years: 15.00    Types: Cigarettes  . Smokeless tobacco: Never Used  Vaping Use  . Vaping Use: Never used  Substance Use Topics  . Alcohol use: No    Alcohol/week: 0.0 standard drinks  . Drug use: No    Home Medications Prior to Admission medications   Medication Sig Start Date End Date Taking? Authorizing Provider  acidophilus (RISAQUAD) CAPS capsule Take 1 capsule by mouth 3 (three) times daily with meals. 04/02/21 05/02/21  Manuella Ghazi, Pratik D, DO  albuterol (PROVENTIL) 2 MG tablet Take 1 tablet  (2 mg total) by mouth daily as needed for shortness of breath. 12/05/20   Gerlene Fee, NP  amiodarone (PACERONE) 200 MG tablet Take 1 tablet (200 mg total) by mouth See admin instructions. oral amiodarone 400mg  bid x 7  days, then 200mg  bid x 2 weeks, then 200mg  daily after that 12/05/20   Gerlene Fee, NP  amitriptyline (ELAVIL) 25 MG tablet Take 1 tablet (25 mg total) by mouth at bedtime. 12/05/20   Gerlene Fee, NP  ascorbic acid (VITAMIN C) 500 MG tablet Take 1 tablet (500 mg total) by mouth daily. 04/03/21 05/03/21  Manuella Ghazi, Pratik D, DO  Balsam [place]-Castor Oil [place]) OINT Apply 1 application topically 3 (three) times daily. Every shift to bilateral buttocks, coccyx, and sacrum    [provider]  cephALEXin (KEFLEX) 500 MG capsule Take 1 capsule (500 mg total) by mouth 4 (four) times daily for 7 days. 04/02/21 04/09/21  Manuella Ghazi, Pratik D, DO  cholestyramine light (PREVALITE) 4 g packet Take 1 packet (4 g total) by mouth 2 (two) times daily. Take either 2 hours before or 2 hours after usual medications are taken. 04/02/21 05/02/21  Manuella Ghazi, Pratik D, DO  Choline Fenofibrate (FENOFIBRIC ACID) 45 MG CPDR Take 1 capsule by mouth daily. 01/03/21   [provider]  dicyclomine (BENTYL) 10 MG capsule Take 1 capsule (10 mg total) by mouth every 6 (six) hours as needed for spasms. Patient not taking: Reported on 04/02/2021 12/05/20   Gerlene Fee, NP  diphenoxylate-atropine (LOMOTIL) 2.5-0.025 MG tablet Take 1 tablet by mouth 3 (three) times daily. 04/02/21   Manuella Ghazi, Pratik D, DO  insulin lispro (HUMALOG KWIKPEN) 100 UNIT/ML KwikPen Inject 4-10 Units into the skin 3 (three) times daily with meals. If blood sugar 121-150, 2 units If blood sugar 151-200, 4 units If blood sugar 201-250, 6 units If blood sugar 251-300, 8 units If blood sugar 301-500, 10 units If blood sugar greater than 350 call MD 12/05/20   Gerlene Fee, NP  Insulin Syringe-Needle U-100 (INSULIN SYRINGE 1CC/31GX5/16") 31G X  5/16" 1 ML MISC For use with administration of octreotide twice daily. 04/02/21   Manuella Ghazi, Pratik D, DO  metoprolol tartrate (LOPRESSOR) 25 MG tablet Take 0.5 tablets (12.5 mg total) by mouth 2 (two) times daily. 12/05/20   Gerlene Fee, NP  naloxone [facility]) nasal spray 4 mg/0.1 mL Place 1 spray into the nose once as needed (opioid overdose). 12/05/20   Gerlene Fee, NP  nicotine (NICODERM CQ - DOSED IN MG/24 HOURS) 21 mg/24hr patch Place 21 mg onto the skin daily. Rotate sites and remove old patch prior to application    [provider]  octreotide (SANDOSTATIN) 100 MCG/ML SOLN injection Inject 0.5 mLs (50 mcg total) into the skin every 12 (twelve) hours. 04/02/21   Manuella Ghazi, Pratik D, DO  omeprazole (PRILOSEC) 40 MG capsule Take 1 capsule (40 mg total) by mouth daily. 12/05/20   Gerlene Fee, NP  Potassium Chloride ER 20 MEQ TBCR Take 20 mEq by mouth daily. 1 tab daily by mouth 12/05/20   Gerlene Fee, NP  pregabalin (LYRICA) 100 MG capsule Take 1 capsule (100 mg total) by mouth 2 (two) times daily. 03/16/21   Kathie Dike, MD  Rivaroxaban (XARELTO) 15 MG TABS tablet Take 1 tablet (15 mg total) by mouth daily with supper. 12/05/20   Gerlene Fee, NP  rosuvastatin (CRESTOR) 20 MG tablet Take 1 tablet (20 mg total) by mouth daily at 6 PM. 12/05/20   Nyoka Cowden, Phylis Bougie, NP  sodium bicarbonate 650 MG tablet Take 1 tablet (650 mg total) by mouth 2 (two) times daily. 12/05/20   Gerlene Fee, NP  torsemide (DEMADEX) 20 MG tablet Take 1 tablet (  20 mg total) by mouth daily. 12/05/20   Gerlene Fee, NP  vitamin B-12 1000 MCG tablet Take 1 tablet (1,000 mcg total) by mouth daily. Patient not taking: Reported on 04/02/2021 10/12/20   Mckinley Jewel, MD  zinc sulfate 220 (50 Zn) MG capsule Take 1 capsule (220 mg total) by mouth daily. 04/03/21 05/03/21  Manuella Ghazi, Pratik D, DO    Allergies    Lactose intolerance (gi)  Review of Systems   Review of Systems  Constitutional: Negative for  fever.  Respiratory: Positive for cough and shortness of breath.   Cardiovascular: Negative for chest pain.  Gastrointestinal: Positive for diarrhea. Negative for abdominal pain and vomiting.  Genitourinary: Negative for dysuria.  All other systems reviewed and are negative.   Physical Exam Updated Vital Signs BP (!) 126/59   Pulse 62   Temp 98.6 F (37 C) (Oral)   Resp (!) 21   Ht 1.702 m (5\' 7" )   Wt 75 kg   SpO2 100%   BMI 25.90 kg/m   Physical Exam Vitals and nursing [name] reviewed.  Constitutional:      Appearance: She is well-developed.     Comments: Chronically ill-appearing, nontoxic  HENT:     Head: Normocephalic and atraumatic.     Mouth/Throat:     Mouth: Mucous membranes are moist.  Eyes:     Pupils: Pupils are equal, round, and reactive to light.  Cardiovascular:     Rate and Rhythm: Normal rate and regular rhythm.     Heart sounds: Normal heart sounds.  Pulmonary:     Effort: Pulmonary effort is normal. No respiratory distress.     Breath sounds: No wheezing.  Abdominal:     General: Bowel sounds are normal.     Palpations: Abdomen is soft.     Tenderness: There is no abdominal tenderness.  Genitourinary:    Comments: Foley catheter in place Musculoskeletal:     Cervical back: Neck supple.     Comments: Bilateral lower extremity amputations  Skin:    General: Skin is warm and dry.  Neurological:     Mental Status: She is alert and oriented to person, place, and time.  Psychiatric:        Mood and Affect: Mood normal.     ED Results / Procedures / Treatments   Labs (all labs ordered are listed, but only abnormal results are displayed) Labs Reviewed  COMPREHENSIVE METABOLIC PANEL - Abnormal; Notable for the following components:      Result Value   Potassium 3.4 (*)    CO2 21 (*)    Glucose, Bld 138 (*)    BUN 36 (*)    Creatinine, Ser 1.70 (*)    Calcium 7.4 (*)    Total Protein 5.5 (*)    Albumin 2.0 (*)    AST 10 (*)    GFR,  Estimated 35 (*)    All other components within normal limits  CBC WITH DIFFERENTIAL/PLATELET - Abnormal; Notable for the following components:   RBC 3.15 (*)    Hemoglobin 8.2 (*)    HCT 26.6 (*)    RDW 21.3 (*)    All other components within normal limits  BLOOD GAS, VENOUS - Abnormal; Notable for the following components:   Bicarbonate 19.3 (*)    Acid-base deficit 5.2 (*)    All other components within normal limits    EKG None  Radiology DG Chest Port 1 View  Result Date: 04/03/2021 CLINICAL DATA:  Weakness and COVID-19 positivity EXAM: PORTABLE CHEST 1 VIEW COMPARISON:  11/29/2020 FINDINGS: Cardiac shadow is enlarged but stable. Aortic calcifications are noted. Patchy airspace opacities are noted increased when compared with the prior exam consistent with the patient's given clinical history. No bony abnormality is noted. No sizable effusion is seen. IMPRESSION: Patchy airspace opacities consistent with the given clinical history. Electronically Signed   By: Inez Catalina M.D.   On: 04/03/2021 23:03    Procedures Procedures   Medications Ordered in ED Medications  potassium chloride SA (KLOR-CON) CR tablet 40 mEq (has no administration in time range)  sodium chloride 0.9 % bolus 500 mL (0 mLs Intravenous Stopped 04/04/21 0028)    ED Course  I have reviewed the triage vital signs and the nursing notes.  Pertinent labs & imaging results that were available during my care of the patient were reviewed by me and considered in my medical decision making (see chart for details).    MDM Rules/Calculators/A&P                          Patient presents with ongoing diarrhea and shortness of breath.  She is chronically ill-appearing.  Symptoms are acute on chronic.  She was incidentally noted to be COVID-positive upon admission.  Unclear whether this was contributing to her worsening diarrhea.  She reports she is taking her medicines as prescribed.  O2 sats initially 95%.  She is  nonambulatory.  CBC, CMP, VBG obtained.  Metabolic derangements including hypokalemia improved.  Potassium 3.4.  She has stable metabolic numbers from discharge although her BUN and creatinine did trend up during her hospitalization.  BUN 36 today and creatinine 1.7.  Baseline 1.3-1.4.  Patient is not requiring any supplemental oxygen.  Chest x-ray is consistent with COVID-19.  EKG shows no acute ischemic or arrhythmic changes.  While PE would be a consideration given her COVID-19 diagnosis and shortness of breath, suspect her shortness of breath is more related to intraparenchymal lung disease at this point.  She has no tachycardia or hypotension to suggest a clinically significant PE.  Patient was reassured.  I did dose her home potassium.  I encouraged that she continue her Imodium and potassium at home as prescribed.  She needs to make sure she is staying hydrated.  Follow-up with primary physician for repeat electrolytes and kidney function testing.  Additionally, follow-up with gastroenterology.  After history, exam, and medical workup I feel the patient has been appropriately medically screened and is safe for discharge home. Pertinent diagnoses were discussed with the patient. Patient was given return precautions.  Final Clinical Impression(s) / ED Diagnoses Final diagnoses:  Diarrhea, unspecified type  COVID-19    Rx / DC Orders ED Discharge Orders    None       [name], Jamie Hair, MD 04/04/21 [phone]

## 2021-04-03 NOTE — ED Provider Notes (Signed)
Emergency Medicine Provider Triage Evaluation Note  [name] , a 55 y.o. female  was evaluated in triage.  Pt complains of she states that she is here for trouble breathing and diarrhea which is ongoing since hospital discharge, 2 days ago.  Review of Systems  Positive: Shortness of breath, cough, diarrhea, weakness Negative: No chest pain or back pain.  Physical Exam  BP 128/65   Pulse 60   Temp 98.6 F (37 C) (Oral)   Resp 17   Ht 5\' 7"  (1.702 m)   Wt 75 kg   SpO2 95%   BMI 25.90 kg/m  Gen:   Awake, noisy respirations Resp:  Normal effort, no respiratory distress MSK:   Moves extremities without difficulty  Other:  Toxic appearance  Medical Decision Making  Medically screening exam initiated at 10:08 PM.  Appropriate orders placed.  [name] was informed that the remainder of the evaluation will be completed by another provider, this initial triage assessment does not replace that evaluation, and the importance of remaining in the ED until their evaluation is complete.  Patient with persistent symptoms following hospital discharge, recently admitted and treated for COVID infection.   Daleen Bo, MD 04/03/21 2212

## 2021-04-04 LAB — COMPREHENSIVE METABOLIC PANEL
ALT: 9 U/L (ref 0–44)
AST: 10 U/L — ABNORMAL LOW (ref 15–41)
Albumin: 2 g/dL — ABNORMAL LOW (ref 3.5–5.0)
Alkaline Phosphatase: 86 U/L (ref 38–126)
Anion gap: 6 (ref 5–15)
BUN: 36 mg/dL — ABNORMAL HIGH (ref 6–20)
CO2: 21 mmol/L — ABNORMAL LOW (ref 22–32)
Calcium: 7.4 mg/dL — ABNORMAL LOW (ref 8.9–10.3)
Chloride: 110 mmol/L (ref 98–111)
Creatinine, Ser: 1.7 mg/dL — ABNORMAL HIGH (ref 0.44–1.00)
GFR, Estimated: 35 mL/min — ABNORMAL LOW (ref >60)
Glucose, Bld: 138 mg/dL — ABNORMAL HIGH (ref 70–99)
Potassium: 3.4 mmol/L — ABNORMAL LOW (ref 3.5–5.1)
Sodium: 137 mmol/L (ref 135–145)
Total Bilirubin: 0.3 mg/dL (ref 0.3–1.2)
Total Protein: 5.5 g/dL — ABNORMAL LOW (ref 6.5–8.1)

## 2021-04-04 LAB — MISC LABCORP TEST (SEND OUT)

## 2021-04-04 LAB — SODIUM, STOOL: Sodium, Stl: 73 mmol/L

## 2021-04-04 MED ORDER — POTASSIUM CHLORIDE CRYS ER 20 MEQ PO TBCR
40.0000 meq | EXTENDED_RELEASE_TABLET | Freq: Once | ORAL | Status: AC
  Administered 2021-04-04: 40 meq via ORAL
  Filled 2021-04-04: qty 2

## 2021-04-04 NOTE — Discharge Instructions (Addendum)
You were seen today for ongoing diarrhea and shortness of breath.  Your diarrhea is acute on chronic.  Continue your Imodium and other medications prescribed by gastroenterology.  Follow-up with gastroenterology for stool studies.  Your labs were improved but it is important that you continue your potassium at home as directed given your ongoing diarrhea.  Your chest x-ray is consistent with COVID-19 and is likely why you are feeling short of breath.  Your oxygen saturations are reassuring at this time.  Obtain a portable pulse oximeter.  If your oxygen saturations drop below 92%, you should be reevaluated.  Make sure that you are staying hydrated.  Follow-up with your primary doctor for repeat metabolic panel and renal function testing.

## 2021-04-05 ENCOUNTER — Other Ambulatory Visit: Payer: Self-pay

## 2021-04-05 ENCOUNTER — Emergency Department (HOSPITAL_COMMUNITY)
Admission: EM | Admit: 2021-04-05 | Discharge: 2021-04-05 | Disposition: A | Discharge status: Home / Self Care | Payer: Medicare Other | Attending: Emergency Medicine | Admitting: Emergency Medicine

## 2021-04-05 ENCOUNTER — Emergency Department (HOSPITAL_COMMUNITY): Payer: Medicare Other

## 2021-04-05 DIAGNOSIS — F17210 Nicotine dependence, cigarettes, uncomplicated: Secondary | ICD-10-CM | POA: Insufficient documentation

## 2021-04-05 DIAGNOSIS — U071 COVID-19: Secondary | ICD-10-CM | POA: Diagnosis not present

## 2021-04-05 DIAGNOSIS — N184 Chronic kidney disease, stage 4 (severe): Secondary | ICD-10-CM | POA: Diagnosis not present

## 2021-04-05 DIAGNOSIS — I130 Hypertensive heart and chronic kidney disease with heart failure and stage 1 through stage 4 chronic kidney disease, or unspecified chronic kidney disease: Secondary | ICD-10-CM | POA: Diagnosis not present

## 2021-04-05 DIAGNOSIS — Z794 Long term (current) use of insulin: Secondary | ICD-10-CM | POA: Diagnosis not present

## 2021-04-05 DIAGNOSIS — R5381 Other malaise: Secondary | ICD-10-CM

## 2021-04-05 DIAGNOSIS — Z7901 Long term (current) use of anticoagulants: Secondary | ICD-10-CM | POA: Insufficient documentation

## 2021-04-05 DIAGNOSIS — Z8616 Personal history of COVID-19: Secondary | ICD-10-CM | POA: Insufficient documentation

## 2021-04-05 DIAGNOSIS — E1122 Type 2 diabetes mellitus with diabetic chronic kidney disease: Secondary | ICD-10-CM | POA: Diagnosis not present

## 2021-04-05 DIAGNOSIS — Z79899 Other long term (current) drug therapy: Secondary | ICD-10-CM | POA: Insufficient documentation

## 2021-04-05 DIAGNOSIS — R197 Diarrhea, unspecified: Secondary | ICD-10-CM | POA: Diagnosis present

## 2021-04-05 DIAGNOSIS — I5041 Acute combined systolic (congestive) and diastolic (congestive) heart failure: Secondary | ICD-10-CM | POA: Insufficient documentation

## 2021-04-05 DIAGNOSIS — I2510 Atherosclerotic heart disease of native coronary artery without angina pectoris: Secondary | ICD-10-CM | POA: Diagnosis not present

## 2021-04-05 LAB — CBC WITH DIFFERENTIAL/PLATELET
Abs Immature Granulocytes: 0.04 10*3/uL (ref 0.00–0.07)
Basophils Absolute: 0.1 10*3/uL (ref 0.0–0.1)
Basophils Relative: 1 %
Eosinophils Absolute: 0.1 10*3/uL (ref 0.0–0.5)
Eosinophils Relative: 1 %
HCT: 33.7 % — ABNORMAL LOW (ref 36.0–46.0)
Hemoglobin: 10.2 g/dL — ABNORMAL LOW (ref 12.0–15.0)
Immature Granulocytes: 1 %
Lymphocytes Relative: 16 %
Lymphs Abs: 1 10*3/uL (ref 0.7–4.0)
MCH: 25.9 pg — ABNORMAL LOW (ref 26.0–34.0)
MCHC: 30.3 g/dL (ref 30.0–36.0)
MCV: 85.5 fL (ref 80.0–100.0)
Monocytes Absolute: 0.4 10*3/uL (ref 0.1–1.0)
Monocytes Relative: 6 %
Neutro Abs: 5 10*3/uL (ref 1.7–7.7)
Neutrophils Relative %: 75 %
Platelets: 195 10*3/uL (ref 150–400)
RBC: 3.94 MIL/uL (ref 3.87–5.11)
RDW: 21.6 % — ABNORMAL HIGH (ref 11.5–15.5)
WBC: 6.6 10*3/uL (ref 4.0–10.5)
nRBC: 0 % (ref 0.0–0.2)

## 2021-04-05 LAB — COMPREHENSIVE METABOLIC PANEL
ALT: 13 U/L (ref 0–44)
AST: 16 U/L (ref 15–41)
Albumin: 2.2 g/dL — ABNORMAL LOW (ref 3.5–5.0)
Alkaline Phosphatase: 97 U/L (ref 38–126)
Anion gap: 5 (ref 5–15)
BUN: 28 mg/dL — ABNORMAL HIGH (ref 6–20)
CO2: 21 mmol/L — ABNORMAL LOW (ref 22–32)
Calcium: 7.9 mg/dL — ABNORMAL LOW (ref 8.9–10.3)
Chloride: 114 mmol/L — ABNORMAL HIGH (ref 98–111)
Creatinine, Ser: 1.53 mg/dL — ABNORMAL HIGH (ref 0.44–1.00)
GFR, Estimated: 40 mL/min — ABNORMAL LOW (ref >60)
Glucose, Bld: 89 mg/dL (ref 70–99)
Potassium: 4 mmol/L (ref 3.5–5.1)
Sodium: 140 mmol/L (ref 135–145)
Total Bilirubin: 0.5 mg/dL (ref 0.3–1.2)
Total Protein: 5.9 g/dL — ABNORMAL LOW (ref 6.5–8.1)

## 2021-04-05 LAB — URINE CULTURE: Culture: >=100000 — AB

## 2021-04-05 NOTE — ED Provider Notes (Signed)
Care of the patient assumed at the change of shift. Patient here from home with recent covid diagnosis, she has had several ED visits since then and now states she is unable to be cared for at home. She was pending evaluation for SNF placement but now reports she wants to go back home and that her husband is on his way to get her. She is requesting her catheter be removed in the meantime.    Truddie Hidden, MD 04/05/21 2129

## 2021-04-05 NOTE — ED Provider Notes (Signed)
[facility] EMERGENCY DEPARTMENT Provider Note   CSN: [id number] Arrival date & time: 04/05/21  1057     History Chief Complaint  Patient presents with  . Shortness of Breath    8uy     [name] is a 55 y.o. female.  Patient was admitted to the hospital May 11 through 16.  Diagnosed with COVID on May 11.  Reported as an incidental finding.  Patient was having difficulty with diarrhea.  The admission on May 11 was for diarrhea.  Patient received remdesivir during that hospitalization.  Patient improved and was discharged home.  Patient seen again in the emergency department on May 17 yesterday.  For persistent diarrhea.  Electrolytes were checked and labs were checked and no significant abnormalities requiring admission.  Oxygen saturations were normal.  Chest x-ray was done yesterday as well which showed findings consistent with COVID pneumonia.  But again she was not hypoxic.  Past medical history significant for chronic back pain diabetes diabetic neuropathy.  Peripheral artery disease.  Bilateral above-the-knee amputations.  Chronic kidney disease stage IV.  Diabetic retinopathy, atrial fibrillation.  And past history of acute respiratory failure with hypoxia.  Patient stating on presentation that she is unable to care for herself at home.  She does live with her husband and another family member.  But states she states that they are not able to help her.  States that she needs to go to a nursing facility.        Past Medical History:  Diagnosis Date  . Abnormal stress test    a. 02/2017 MV: large region of fixed perfusion defect in basal to mid inf, mid-dist inflat walls, EF 43%. No ischemia (EF 55-60% by f/u echo).  . Arthritis   . Asthma   . Carotid arterial disease ([place])    a. 09/2017 Carotid U/S: 40-49% bilat ICA stenosis.  . Chronic back pain   . Coronary artery calcification seen on CT scan    a. 11/2017 CT Abd/Pelvis: Multi vessel coronary vascular Ca2+.  .  Depression   . Diabetes mellitus   . Diabetic neuropathy ([employer])   . Difficult intubation    DIFFICULT AIRWAY/FYI  . Family history of adverse reaction to anesthesia    mother had difficlty waking   . Femoral-popliteal bypass graft occlusion, left ([place]) 12/02/2017  . GERD (gastroesophageal reflux disease)   . History of echocardiogram    a. 03/2017 Echo: EF 55-60%, mild LVH, nl RV fxn.  . Hyperlipidemia   . Ischemic cardiomyopathy    a. 04/2018 Echo: EF 30-35%, Gr2 DD, mild LVH. Mild MR. Mildly dil LA, mod dil RV w/ mod red RV fxn, mild TR, PASP 6mmHg.  . NSTEMI (non-ST elevated myocardial infarction) ([place])    a. 05/2018 in setting of Afib, sepsis, and post-op L AKA. Peak trop 7.7. EF 30-35% by echo-->cath not performed 2/2 renal failure.  . 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 PAD (peripheral artery disease) ([place])    a. S/p L fem-pop bypass; b. 11/2017 s/p Aortobifem bypass 2/2 graft occlusion; c. 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  . Paroxysmal atrial fibrillation with rapid ventricular response ([place]) 12/02/2017   a. CHA2DS2VASc = 3-->Xarelto; b. 05/2018 Recurrent Afib-->amio.  Marland Kitchen Renal insufficiency   . Ulcer    Foot    Patient Active Problem List   Diagnosis Date Noted  . Acute urinary retention 03/30/2021  . CKD (chronic kidney disease) stage 3A,  GFR 30-59 ml/min  03/30/2021  . COVID-19 virus infection 03/29/2021  . Hypokalemia due to excessive gastrointestinal loss of potassium 03/29/2021  . Hypomagnesemia 03/29/2021  . Diarrhea   . Non-intractable vomiting   . Left lower quadrant abdominal pain   . Hypokalemia 03/09/2021  . Electrolyte abnormality 03/08/2021  . S/P bilateral above knee amputation ([place]) 03/08/2021  . 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 CKD stage 4 due to type 2 diabetes mellitus ([place]) 12/05/2020  . Hyperlipidemia associated with type 2 diabetes mellitus ([place])  12/05/2020  . Controlled type 2 diabetes mellitus with stage 4 chronic kidney disease, with long-term current use of insulin ([place]) 12/05/2020  . S/P BKA (below knee amputation) unilateral, right ([place]) 12/05/2020  . Anemia due to stage 4 chronic kidney disease ([place]) 12/05/2020  . Leukopenia 11/29/2020  . Elevated brain natriuretic peptide (BNP) level 11/29/2020  . Elevated d-dimer 11/29/2020  . Prolonged QT interval 11/29/2020  . Stable treated proliferative diabetic retinopathy of left eye determined by examination associated with type 2 diabetes mellitus ([place]) 08/14/2020  . Macular pucker, left eye 02/24/2020  . Follow-up examination after eye surgery 02/24/2020  . Coronary artery disease involving native coronary artery of native heart without angina pectoris 11/21/2018  . Ischemic cardiomyopathy 11/21/2018  . Essential hypertension 11/21/2018  . Labile blood pressure   . Diabetic retinopathy associated with type 2 diabetes mellitus ([place])   . Hypoalbuminemia due to protein-calorie malnutrition ([place])   . Type 2 diabetes mellitus with peripheral neuropathy (HCC)   . Chronic combined systolic (congestive) and diastolic (congestive) heart failure ([place])   . Unilateral AKA, left ([place])   . Atrial fibrillation with rapid ventricular response ([place])   . Diabetic peripheral neuropathy ([place])   . Anemia of chronic disease   . Gastroesophageal reflux disease   . Non-ST elevation (NSTEMI) myocardial infarction ([place])   . Acute combined systolic and diastolic heart failure ([place])   . Acute respiratory failure with hypoxia ([place])   . Microcytic anemia 04/25/2018  . Pulmonary nodule 04/25/2018  . Gangrene of lower extremity ([place]) 04/25/2018  . Femoral-popliteal bypass graft occlusion, left ([place]) 12/02/2017  . Respiratory failure with hypoxia ([place]) 12/02/2017  . Leukocytosis 12/02/2017  . Paroxysmal atrial fibrillation with rapid ventricular response ([place]) 12/02/2017  . Hyperkalemia 06/26/2017  .  Elevated lactic acid level   . Chronic back pain 11/27/2016  . Hyperlipidemia LDL goal <70   . Retinal tumor of right eye   . 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 Paresthesia of both hands 03/05/2016  . DDD (degenerative disc disease), lumbosacral 03/26/2015  . DDD (degenerative disc disease), cervical 03/26/2015  . Sacroiliac joint disease 03/26/2015  . Occipital neuralgia 03/26/2015  . Pain in limb-Bilateral Leg 04/25/2014  . Peripheral vascular disease ([place]) 04/05/2013  . Chronic total occlusion of artery of the extremities ([place]) 02/10/2012    Past Surgical History:  Procedure Laterality Date  . ABDOMINAL AORTAGRAM  June 15, 2014  . ABDOMINAL AORTAGRAM N/A 06/15/2014   Procedure: ABDOMINAL Maxcine Ham;  Surgeon: Serafina Mitchell, MD;  [facility] CATH LAB;  Service: Cardiovascular;  Laterality: N/A;  . ABDOMINAL AORTAGRAM N/A 11/22/2014   Procedure: ABDOMINAL AORTAGRAM;  Surgeon: Serafina Mitchell, MD;  Location: [facility] CATH LAB;  Service: Cardiovascular;  Laterality: N/A;  . ABDOMINAL AORTOGRAM W/LOWER EXTREMITY N/A 01/07/2017   Procedure: Abdominal Aortogram w/Lower Extremity;  Surgeon: Serafina Mitchell, MD;  Location: [place] CV LAB;  Service: Cardiovascular;  Laterality: N/A;  . ABDOMINAL AORTOGRAM W/LOWER EXTREMITY N/A 10/31/2017   Procedure: ABDOMINAL AORTOGRAM W/LOWER EXTREMITY;  Surgeon: Elam Dutch, MD;  Location: [place] CV LAB;  Service: Cardiovascular;  Laterality: N/A;  . ABDOMINAL AORTOGRAM W/LOWER EXTREMITY N/A 03/24/2018   Procedure: ABDOMINAL AORTOGRAM W/LOWER EXTREMITY;  Surgeon: Serafina Mitchell, MD;  Location: [place] CV LAB;  Service: Cardiovascular;  Laterality: N/A;  . AMPUTATION Left 04/26/2018   Procedure: AMPUTATION ABOVE KNEE;  Surgeon: Elam Dutch, MD;  Location: [place];  Service: Vascular;  Laterality: Left;  . AORTA - BILATERAL FEMORAL ARTERY BYPASS GRAFT N/A 11/28/2017   Procedure: AORTA BIFEMORAL BYPASS USING HEMASHIELD GOLD GRAFT  & REIMPLANT IMA;  Surgeon: Serafina Mitchell, MD;  Location: [facility] OR;  Service: Vascular;  Laterality: N/A;  . AORTIC ARCH ANGIOGRAPHY N/A 10/31/2017   Procedure: AORTIC ARCH ANGIOGRAPHY;  Surgeon: Elam Dutch, MD;  Location: [place] CV LAB;  Service: Cardiovascular;  Laterality: N/A;  . APPLICATION OF WOUND VAC  11/28/2017   Procedure: APPLICATION OF WOUND VAC;  Surgeon: Serafina Mitchell, MD;  Location: [facility] OR;  Service: Vascular;;  . APPLICATION OF WOUND VAC Left 03/27/2018   Procedure: APPLICATION OF WOUND VAC LEFT GROIN USING PREVENA PLUS;  Surgeon: Serafina Mitchell, MD;  Location: [place];  Service: Vascular;  Laterality: Left;  . ARTERIAL BYPASS SURGERY   07/05/2010   Right Common Femoral to below knee popliteal BPG  . BACK SURGERY     X's  2  . BIOPSY  03/13/2021   Procedure: BIOPSY;  Surgeon: Harvel Quale, MD;  [facility];  Service: Gastroenterology;;  . CARDIAC CATHETERIZATION    . CHOLECYSTECTOMY     Gall Bladder  . COLONOSCOPY WITH PROPOFOL N/A 03/13/2021   Procedure: COLONOSCOPY WITH PROPOFOL;  Surgeon: Harvel Quale, MD;  [facility];  Service: Gastroenterology;  Laterality: N/A;  with random colonic biopsies  . CYSTECTOMY Left    wrist  . EMBOLECTOMY Left 11/28/2017   Procedure: Left Lower Extremity Embolectomy, Left Tibial Peroneal Trunk Endarterectomy with Patch Angioplasty; Vein Harvest Small Saphenous Graft Left Lower Leg;  Surgeon: [name], MD;  Location: [place];  Service: Vascular;  Laterality: Left;  . ESOPHAGOGASTRODUODENOSCOPY (EGD) WITH PROPOFOL N/A 03/13/2021   Procedure: ESOPHAGOGASTRODUODENOSCOPY (EGD) WITH PROPOFOL;  Surgeon: Harvel Quale, MD;  [facility];  Service: Gastroenterology;  Laterality: N/A;  . EYE SURGERY Left 02/23/2020   Dr. Zadie Rhine  . FEMORAL-POPLITEAL BYPASS GRAFT Left 03/27/2018   Procedure: THROMBECTOMY OF LEFT FEMORAL TIBIAL BYPASS;  Surgeon: Serafina Mitchell, MD;  Location: [place];  Service: Vascular;  Laterality: Left;  . FEMORAL-TIBIAL BYPASS GRAFT Left 03/27/2018   Procedure: BYPASS GRAFT FEMORAL-TIBIAL ARTERY LEFT REDO USING CRYOPRESERVED SAPHENOUS VEIN 70cm;  Surgeon: Serafina Mitchell, MD;  Location: [facility] OR;  Service: Vascular;  Laterality: Left;  . INTERCOSTAL NERVE BLOCK  November 2015  . INTRAOPERATIVE ARTERIOGRAM  11/28/2017   Procedure: INTRA OPERATIVE ARTERIOGRAM OF LEFT LEG;  Surgeon: Serafina Mitchell, MD;  Location: [place];  Service: Vascular;;  . INTRAOPERATIVE ARTERIOGRAM Left 03/27/2018   Procedure: INTRA OPERATIVE ARTERIOGRAM TIMES TWO;  Surgeon: Serafina Mitchell, MD;  Location: [place];  Service: Vascular;  Laterality: Left;  . IR FLUORO GUIDE CV LINE RIGHT  03/20/2017  . IR FLUORO GUIDE CV LINE RIGHT  05/05/2018  . IR REMOVAL TUN CV CATH W/O FL  05/20/2018  . IR  US GUIDE VASC ACCESS RIGHT  03/20/2017  . IR US GUIDE VASC ACCESS RIGHT  05/05/2018  . IRRIGATION AND DEBRIDEMENT BUTTOCKS Right 09/30/2016   Procedure: DEBRIDEMENT RIGHT  BUTTOCK WOUND;  Surgeon: Georganna Skeans, MD;  Location: [place];  Service: General;  Laterality: Right;  . left foot surgery    . PERIPHERAL VASCULAR CATHETERIZATION N/A 05/07/2016   Procedure: Abdominal Aortogram;  Surgeon: Serafina Mitchell, MD;  Location: [place] CV LAB;  Service: Cardiovascular;  Laterality: N/A;  . PERIPHERAL VASCULAR CATHETERIZATION N/A 05/07/2016   Procedure: Lower Extremity Angiography;  Surgeon: Serafina Mitchell, MD;  Location: [place] CV LAB;  Service: Cardiovascular;  Laterality: N/A;  . PERIPHERAL VASCULAR CATHETERIZATION N/A 05/07/2016   Procedure: Aortic Arch Angiography;  Surgeon: Serafina Mitchell, MD;  Location: [place] CV LAB;  Service: Cardiovascular;  Laterality: N/A;  . PERIPHERAL VASCULAR CATHETERIZATION N/A 05/07/2016   Procedure: Upper Extremity Angiography;  Surgeon: Serafina Mitchell, MD;  Location: [place] CV LAB;  Service: Cardiovascular;  Laterality: N/A;  .  PERIPHERAL VASCULAR CATHETERIZATION Right 05/07/2016   Procedure: Peripheral Vascular Balloon Angioplasty;  Surgeon: Serafina Mitchell, MD;  Location: [place] CV LAB;  Service: Cardiovascular;  Laterality: Right;  subclavian  . PERIPHERAL VASCULAR CATHETERIZATION Right 05/07/2016   Procedure: Peripheral Vascular Intervention;  Surgeon: Serafina Mitchell, MD;  Location: [place] CV LAB;  Service: Cardiovascular;  Laterality: Right;  External  Iliac  . POLYPECTOMY  03/13/2021   Procedure: POLYPECTOMY;  Surgeon: Harvel Quale, MD;  [facility];  Service: Gastroenterology;;  . SKIN GRAFT Right 2012   [name] by Dr. Nils Pyle- Right and Left Ankle  . SPINE SURGERY    . THROMBECTOMY FEMORAL ARTERY  11/28/2017   Procedure: THROMBECTOMY  & REVISION OF BILATERAL FEMORAL TO POPLETEAL ARTERIES;  Surgeon: Serafina Mitchell, MD;  Location: [place];  Service: Vascular;;  . TONSILLECTOMY    . TRANSMETATARSAL AMPUTATION Right 10/07/2020   Procedure: RIGHT BELOW KNEE AMPUTATION;  Surgeon: Erle Crocker, MD;  Location: [place];  Service: Orthopedics;  Laterality: Right;     OB History   No obstetric history on file.     Family History  Problem Relation Age of Onset  . Coronary artery disease Mother   . Peripheral vascular disease Mother   . Heart disease Mother        Before age 22  . Other Mother        Venous insuffiency  . Diabetes Mother   . Hyperlipidemia Mother   . Hypertension Mother   . Varicose Veins Mother   . Heart attack Mother        before age 28  . Heart disease Father   . Diabetes Father   . Diabetes Maternal Grandmother   . Diabetes Paternal Grandmother   . Diabetes Paternal Grandfather   . Diabetes Sister   . Hypertension Sister   . Diabetes Brother   . Hypertension Brother     Social History   Tobacco Use  . Smoking status: Current Every Day Smoker    Packs/day: 0.50    Years: 30.00    Pack years: 15.00    Types: Cigarettes  . Smokeless  tobacco: Never Used  Vaping Use  . Vaping Use: Never used  Substance Use Topics  . Alcohol use: No    Alcohol/week: 0.0 standard drinks  . Drug use: No    Home Medications Prior to Admission medications   Medication Sig  Start Date End Date Taking? Authorizing Provider  acidophilus (RISAQUAD) CAPS capsule Take 1 capsule by mouth 3 (three) times daily with meals. 04/02/21 05/02/21  Manuella Ghazi, Pratik D, DO  albuterol (PROVENTIL) 2 MG tablet Take 1 tablet (2 mg total) by mouth daily as needed for shortness of breath. 12/05/20   Gerlene Fee, NP  amiodarone (PACERONE) 200 MG tablet Take 1 tablet (200 mg total) by mouth See admin instructions. oral amiodarone 400mg  bid x 7 days, then 200mg  bid x 2 weeks, then 200mg  daily after that 12/05/20   Gerlene Fee, NP  amitriptyline (ELAVIL) 25 MG tablet Take 1 tablet (25 mg total) by mouth at bedtime. 12/05/20   Gerlene Fee, NP  ascorbic acid (VITAMIN C) 500 MG tablet Take 1 tablet (500 mg total) by mouth daily. 04/03/21 05/03/21  Manuella Ghazi, Pratik D, DO  Balsam [place]-Castor Oil [place]) OINT Apply 1 application topically 3 (three) times daily. Every shift to bilateral buttocks, coccyx, and sacrum    [provider]  cephALEXin (KEFLEX) 500 MG capsule Take 1 capsule (500 mg total) by mouth 4 (four) times daily for 7 days. 04/02/21 04/09/21  Manuella Ghazi, Pratik D, DO  cholestyramine light (PREVALITE) 4 g packet Take 1 packet (4 g total) by mouth 2 (two) times daily. Take either 2 hours before or 2 hours after usual medications are taken. 04/02/21 05/02/21  Manuella Ghazi, Pratik D, DO  Choline Fenofibrate (FENOFIBRIC ACID) 45 MG CPDR Take 1 capsule by mouth daily. 01/03/21   [provider]  dicyclomine (BENTYL) 10 MG capsule Take 1 capsule (10 mg total) by mouth every 6 (six) hours as needed for spasms. Patient not taking: Reported on 04/02/2021 12/05/20   Gerlene Fee, NP  diphenoxylate-atropine (LOMOTIL) 2.5-0.025 MG tablet Take 1 tablet by mouth 3 (three)  times daily. 04/02/21   Manuella Ghazi, Pratik D, DO  insulin lispro (HUMALOG KWIKPEN) 100 UNIT/ML KwikPen Inject 4-10 Units into the skin 3 (three) times daily with meals. If blood sugar 121-150, 2 units If blood sugar 151-200, 4 units If blood sugar 201-250, 6 units If blood sugar 251-300, 8 units If blood sugar 301-500, 10 units If blood sugar greater than 350 call MD 12/05/20   Gerlene Fee, NP  Insulin Syringe-Needle U-100 (INSULIN SYRINGE 1CC/31GX5/16") 31G X 5/16" 1 ML MISC For use with administration of octreotide twice daily. 04/02/21   Manuella Ghazi, Pratik D, DO  metoprolol tartrate (LOPRESSOR) 25 MG tablet Take 0.5 tablets (12.5 mg total) by mouth 2 (two) times daily. 12/05/20   Gerlene Fee, NP  naloxone [facility]) nasal spray 4 mg/0.1 mL Place 1 spray into the nose once as needed (opioid overdose). 12/05/20   Gerlene Fee, NP  nicotine (NICODERM CQ - DOSED IN MG/24 HOURS) 21 mg/24hr patch Place 21 mg onto the skin daily. Rotate sites and remove old patch prior to application    [provider]  octreotide (SANDOSTATIN) 100 MCG/ML SOLN injection Inject 0.5 mLs (50 mcg total) into the skin every 12 (twelve) hours. 04/02/21   Manuella Ghazi, Pratik D, DO  omeprazole (PRILOSEC) 40 MG capsule Take 1 capsule (40 mg total) by mouth daily. 12/05/20   Gerlene Fee, NP  Potassium Chloride ER 20 MEQ TBCR Take 20 mEq by mouth daily. 1 tab daily by mouth 12/05/20   Gerlene Fee, NP  pregabalin (LYRICA) 100 MG capsule Take 1 capsule (100 mg total) by mouth 2 (two) times daily. 03/16/21   Kathie Dike, MD  Rivaroxaban Alveda [facility]asons)  15 MG TABS tablet Take 1 tablet (15 mg total) by mouth daily with supper. 12/05/20   Gerlene Fee, NP  rosuvastatin (CRESTOR) 20 MG tablet Take 1 tablet (20 mg total) by mouth daily at 6 PM. 12/05/20   Nyoka Cowden, Phylis Bougie, NP  sodium bicarbonate 650 MG tablet Take 1 tablet (650 mg total) by mouth 2 (two) times daily. 12/05/20   Gerlene Fee, NP  torsemide (DEMADEX) 20 MG tablet Take 1  tablet (20 mg total) by mouth daily. 12/05/20   Gerlene Fee, NP  vitamin B-12 1000 MCG tablet Take 1 tablet (1,000 mcg total) by mouth daily. Patient not taking: Reported on 04/02/2021 10/12/20   Mckinley Jewel, MD  zinc sulfate 220 (50 Zn) MG capsule Take 1 capsule (220 mg total) by mouth daily. 04/03/21 05/03/21  Manuella Ghazi, Pratik D, DO    Allergies    Lactose intolerance (gi)  Review of Systems   Review of Systems  Constitutional: Positive for appetite change and fatigue. Negative for chills and fever.  HENT: Negative for rhinorrhea and sore throat.   Eyes: Negative for visual disturbance.  Respiratory: Positive for cough and shortness of breath.   Cardiovascular: Negative for chest pain and leg swelling.  Gastrointestinal: Positive for diarrhea. Negative for abdominal pain, nausea and vomiting.  Genitourinary: Negative for dysuria.  Musculoskeletal: Negative for back pain and neck pain.  Skin: Negative for rash.  Neurological: Negative for dizziness, light-headedness and headaches.  Hematological: Does not bruise/bleed easily.  Psychiatric/Behavioral: Negative for confusion.    Physical Exam Updated Vital Signs BP (!) 150/67   Pulse 68   Temp 97.8 F (36.6 C)   Resp 15   Ht 1.702 m (5\' 7" )   Wt 68 kg   SpO2 100%   BMI 23.49 kg/m   Physical Exam Vitals and nursing note reviewed.  Constitutional:      General: She is not in acute distress.    Appearance: Normal appearance. She is well-developed.  HENT:     Head: Normocephalic and atraumatic.     Mouth/Throat:     Mouth: Mucous membranes are dry.  Eyes:     Extraocular Movements: Extraocular movements intact.     Conjunctiva/sclera: Conjunctivae normal.     Pupils: Pupils are equal, round, and reactive to light.  Cardiovascular:     Rate and Rhythm: Normal rate and regular rhythm.     Heart sounds: No murmur heard.   Pulmonary:     Effort: Pulmonary effort is normal. No respiratory distress.     Breath sounds:  Wheezing present. No rhonchi or rales.  Abdominal:     Palpations: Abdomen is soft.     Tenderness: There is no abdominal tenderness.  Musculoskeletal:     Cervical back: Normal range of motion and neck supple.     Comments: Bilateral above-the-knee amputations  Skin:    General: Skin is warm and dry.     Capillary Refill: Capillary refill takes 2 to 3 seconds.  Neurological:     General: No focal deficit present.     Mental Status: She is alert and oriented to person, place, and time.     Cranial Nerves: No cranial nerve deficit.     Sensory: No sensory deficit.     Motor: No weakness.     ED Results / Procedures / Treatments   Labs (all labs ordered are listed, but only abnormal results are displayed) Labs Reviewed  COMPREHENSIVE METABOLIC PANEL - Abnormal; Notable  for the following components:      Result Value   Chloride 114 (*)    CO2 21 (*)    BUN 28 (*)    Creatinine, Ser 1.53 (*)    Calcium 7.9 (*)    Total Protein 5.9 (*)    Albumin 2.2 (*)    GFR, Estimated 40 (*)    All other components within normal limits  CBC WITH DIFFERENTIAL/PLATELET - Abnormal; Notable for the following components:   Hemoglobin 10.2 (*)    HCT 33.7 (*)    MCH 25.9 (*)    RDW 21.6 (*)    All other components within normal limits    EKG EKG Interpretation  Date/Time:  Thursday Apr 05 2021 11:07:12 EDT Ventricular Rate:  65 PR Interval:  167 QRS Duration: 98 QT Interval:  547 QTC Calculation: 569 R Axis:   78 Text Interpretation: Sinus rhythm Borderline T abnormalities, anterior leads Prolonged QT interval Confirmed by Fredia Sorrow [phone]) on 04/05/2021 1:08:00 PM   Radiology DG Chest Port 1 View  Result Date: 04/05/2021 CLINICAL DATA:  Shortness of breath.  Recent COVID-19 positive EXAM: PORTABLE CHEST 1 VIEW COMPARISON:  Apr 03, 2021 in November 29, 2020 FINDINGS: There is consolidation in the left lower lobe, increased from 2 days prior. There is ill-defined airspace  opacity in the left mid lung with underlying interstitial thickening bilaterally. There is an equivocal left pleural effusion. There is cardiomegaly with pulmonary vascularity normal. No adenopathy. There is aortic atherosclerosis. No bone lesions IMPRESSION: Airspace consolidation consistent with pneumonia left lower lobe, increased. Equivocal left pleural effusion. Subtle ill-defined opacity left mid lung which may represent a second focus of pneumonia. Underlying interstitial thickening is likely due to chronic bronchitis, a stable finding. There is stable cardiac enlargement. Aortic Atherosclerosis (ICD10-I70.0). Electronically Signed   By: Lowella Grip III M.D.   On: 04/05/2021 13:09   DG Chest Port 1 View  Result Date: 04/03/2021 CLINICAL DATA:  Weakness and COVID-19 positivity EXAM: PORTABLE CHEST 1 VIEW COMPARISON:  11/29/2020 FINDINGS: Cardiac shadow is enlarged but stable. Aortic calcifications are noted. Patchy airspace opacities are noted increased when compared with the prior exam consistent with the patient's given clinical history. No bony abnormality is noted. No sizable effusion is seen. IMPRESSION: Patchy airspace opacities consistent with the given clinical history. Electronically Signed   By: Inez Catalina M.D.   On: 04/03/2021 23:03    Procedures Procedures   Medications Ordered in ED Medications - No data to display  ED Course  I have reviewed the triage vital signs and the nursing notes.  Pertinent labs & imaging results that were available during my care of the patient were reviewed by me and considered in my medical decision making (see chart for details).    MDM Rules/Calculators/A&P                          Patient's electrolytes here without significant changes.  No leukocytosis.  Oxygen saturations in the upper 90s on room air.  Not tachycardic.  Patient's renal function prohibits any work-up as far as CT angio goes.  But clinically do not feel there is concern  for pulmonary embolus.  Patient based on labs compared to the labs from yesterday could be discharged home.  Chest x-ray does show some worsening infiltrates compared to yesterday.  However again she is not hypoxic she is not tachypneic she is not tachycardic.  She could receive symptomatic  treatment.  But patient states that she is no longer able to care for herself at home.  He must go to a nursing facility.  Have put in consults for physical therapy and for the social worker.  Patient aware though would being COVID-positive is can be very difficult to place her in a timely fashion.  Evening physician Dr. Karle Starch made aware of patient because patient although not hypoxic at the moment based on the changes in the chest x-ray and having COVID could significantly get worse and could end up having respiratory problems.     Final Clinical Impression(s) / ED Diagnoses Final diagnoses:  COVID    Rx / DC Orders ED Discharge Orders    None       Fredia Sorrow, MD 04/05/21 1811

## 2021-04-05 NOTE — ED Notes (Signed)
Patient has foley catheter on arrival to the ED; urine bag is below level of bladder, patient has leg strap attached. Foley is patent and draining. Patient denies needs associated with foley at this time.

## 2021-04-05 NOTE — ED Notes (Signed)
Husband called to check in with patient and updated on plan of care.

## 2021-04-05 NOTE — ED Notes (Signed)
Patient given water to drink and propped up on right side due to increased pain in lower back

## 2021-04-05 NOTE — ED Notes (Signed)
Patient had bowel movement. Patient assisted with moving and new brief changed at this time.

## 2021-04-05 NOTE — TOC Progression Note (Signed)
ED MD entered [facility] consult for SNF placement. Pt is well known to TOC from multiple previous admissions including earlier this week with Covid diagnosis. Pt's diagnosis of Covid was made on 03/28/21. Pt will not be eligible for SNF placement until AT LEAST 11 days post diagnosis. Pt would also need PT consult to determine if SNF is recommended (vs pt being custodial care level) and then she would need insurance authorization.  ED CSW updated MD via secure chat.   TOC will follow up tomorrow.

## 2021-04-05 NOTE — ED Notes (Signed)
Patient given 2 warm blankets at this time.

## 2021-04-05 NOTE — ED Triage Notes (Signed)
Patient covid positive X1 week ago here at this hospital. Patient complains of SHOB, fatigue, and gasping for air. Patient has foley in place on arrival to ED.

## 2021-04-05 NOTE — ED Notes (Signed)
Patient given applesauce and graham crackers at this time.

## 2021-04-06 LAB — 5 HIAA, QUANTITATIVE, URINE, 24 HOUR
5-HIAA, Ur: 2.8 mg/L
5-HIAA,Quant.,24 Hr Urine: 3.8 mg/24 hr (ref 0.0–14.9)
Total Volume: 1350

## 2021-04-08 LAB — PANCREATIC ELASTASE, FECAL: Pancreatic Elastase-1, Stool: <50 ug Elast./g — ABNORMAL LOW (ref >200)

## 2021-04-10 ENCOUNTER — Other Ambulatory Visit (INDEPENDENT_AMBULATORY_CARE_PROVIDER_SITE_OTHER): Payer: Self-pay | Admitting: Internal Medicine

## 2021-04-10 MED ORDER — PANCRELIPASE (LIP-PROT-AMYL) 36000-114000 UNITS PO CPEP: ORAL_CAPSULE | ORAL | 11 refills | Status: DC

## 2021-04-10 NOTE — Progress Notes (Signed)
Results of fecal elastase reviewed with patient. Fecal elastase is less than 15. Results reviewed with patient. Patient begun on Creon 36,000; 2 capsules of each meal and 1 with snack.  Total daily doses 8 capsules. Prescription sent to patient's pharmacy for 1 month 11 refills. Patient advised to discontinue Sandostatin.  She says pharmacy never filled out prescription. Patient has an appointment to see Ms. Aliene Altes, PA-C in August; Will request appointment next month.

## 2021-04-18 ENCOUNTER — Telehealth: Payer: Self-pay | Admitting: *Deleted

## 2021-04-18 NOTE — Telephone Encounter (Signed)
Patient reports she has had diarrhea over 10 times since yesterday. No blood in stool. Half the time it is water like and others it's just real loose (softer than soft per pt). She states she has been taking "a little tiny pill and a capsule and some powder". She takes "some pills while I eat (8 tablets at a time)". She doesn't know the name of it. She doesn't understanding why she is still having so much diarrhea and wants to know what she should do.

## 2021-04-19 ENCOUNTER — Telehealth: Payer: Self-pay

## 2021-04-19 ENCOUNTER — Other Ambulatory Visit: Payer: Self-pay | Admitting: Gastroenterology

## 2021-04-19 DIAGNOSIS — R197 Diarrhea, unspecified: Secondary | ICD-10-CM

## 2021-04-19 MED ORDER — PANCRELIPASE (LIP-PROT-AMYL) 36000-114000 UNITS PO CPEP: ORAL_CAPSULE | ORAL | 11 refills | Status: AC

## 2021-04-19 MED ORDER — DIPHENOXYLATE-ATROPINE 2.5-0.025 MG PO TABS: 1.0000 | ORAL_TABLET | Freq: Three times a day (TID) | ORAL | 1 refills | Status: DC

## 2021-04-19 NOTE — Telephone Encounter (Signed)
noted 

## 2021-04-19 NOTE — Telephone Encounter (Signed)
Rx for Diphenoxylate-atropine (Lomotil) faxed to [facility].

## 2021-04-19 NOTE — Progress Notes (Signed)
See telephone note dated 04/18/21 for details.   Pancreatic enzymes increased due to ongoing diarrhea.  Lomotil refilled. Rx was printed and will be faxed to pharmacy.  BMP to recheck electrolytes in the setting of ongoing diarrhea.

## 2021-04-19 NOTE — Telephone Encounter (Signed)
Spoke with patient. Reports flare of diarrhea x2 days. 10 BMs yesterday. Also with nocturnal stool which she reports is new since hospital discharge. 3 Bms so far today. Prior to acute worsening, she was having about 4-5 Bms daily without nocturnal stools. She does tell me she hasn't been entirely compliant with lactose avoidance and did have a burger from fast food restaurant yesterday. Also eating something similar to little Debbie cakes for "snacks".    Spoke with patient's husband to verify her medications.  She is taking Questran 4 g twice daily, pancreatic enzymes (2 with meals and 1 with snacks).  She had been taking Lomotil, but ran out about 3 days ago.  Patient was unaware of this.  Notably, this correlates very closely to when her diarrhea flared.  Plan:  1.  I am increasing her pancreatic enzymes to 3 capsules with meals and 2 with snacks. 2.  I am refilling her Lomotil.  She was advised to resume this 3 times daily with meals. Advised that she may be able to reduce the frequency of Lomotil over time with increasing her pancreatic enzymes. 3.  I discussed extensively the fact that she needs to follow a low-fat diet.  Recommended avoidance of fried, fatty foods, beef products, sweets/cakes. 4.  Also advised complete avoidance of all dairy products in the setting of lactose intolerance. 5.  She is to complete BMP at [facility] to ensure electrolytes have remained within normal limits. Order has been placed.

## 2021-04-19 NOTE — Telephone Encounter (Signed)
Rx for

## 2021-04-23 ENCOUNTER — Ambulatory Visit: Payer: Medicare Other | Admitting: Urology

## 2021-05-14 ENCOUNTER — Encounter (INDEPENDENT_AMBULATORY_CARE_PROVIDER_SITE_OTHER): Payer: Medicare Other | Admitting: Ophthalmology

## 2021-06-26 ENCOUNTER — Encounter (INDEPENDENT_AMBULATORY_CARE_PROVIDER_SITE_OTHER): Payer: Medicare Other | Admitting: Ophthalmology

## 2021-06-30 NOTE — Progress Notes (Deleted)
Referring Provider: Frazier Richards, MD Primary Care Physician:  Frazier Richards, MD Primary GI Physician: Dr. Abbey Chatters  No chief complaint on file.   HPI:   [name] is a 55 y.o. female presenting today for hospital follow-up of diarrhea, nausea, vomiting, with associated electrolyte abnormalities.    Patient was admitted in April and May 2022 due to worsening chronic diarrhea with associated nausea, vomiting, and electrolyte abnormalities. Significant work-up with Cdiff and GI pathogen panel negative, TSH normal, Celiac serologies negative, EGD with duodenal biopsy benign. Colonoscopy with 9 tubular adenomas, normal TI, random colon biopsies (benign), recommended repeat colonoscopy in 6 months due to piecemeal polyp resection. 24 hr urine 5HIAA negative. She reported lactose intolerance yet had continued consuming dairy products. Pancreatic elastase <50 consistent with severe pancreatic insufficiency (resulted after hospital diacharge). She was initially discharged on Lomotil 1 tablet TID, octreotide 50 mcg BID, and Questran 4 grams BID. Later advised to stop octreotide, which patient reported her pharmacy never filled, and she was started on Creon.   Patient called 04/18/21 reporting flare of diarrhea. Admitted to being non-compliant with lactose free diet and was also eating hamburger from fast food restaurant and little debbie cakes. Also reported running out of Lomotil. Pancreatic enzymes were increased to 3 capsules with meals and 2 with snacks, Lomotil was refilled, and discussed need for low fat and lactose free diet. An order was placed for a BMP, but was never completed.   Regarding chronic nausea/vomiting. She reported early satiety as well. EGD during hospitalization was unremarkable. Possible gastroparesis. Consider OP GES.   CT also complete 5/12 suggesting cirrhosis. No splenomegaly.   Today:  Diarrhea:   Cirrhosis: Slight nodular hepatic contours suggesting cirrhosis on  CT A/P with contrast on 5/12 as well as CT without contrast in Jan 2019.  MELD: No recent INR to calculate. MELD 17 in January 2022.  Alcohol:  IV drug use:  Hep A/B Vaccines:  Signs/symptoms of decompensation:   Iron panel within normal limits.  Check Hep. Labs   Past Medical History:  Diagnosis Date   Abnormal stress test    a. 02/2017 MV: large region of fixed perfusion defect in basal to mid inf, mid-dist inflat walls, EF 43%. No ischemia (EF 55-60% by f/u echo).   Arthritis    Asthma    Carotid arterial disease ([place])    a. 09/2017 Carotid U/S: 40-49% bilat ICA stenosis.   Chronic back pain    Coronary artery calcification seen on CT scan    a. 11/2017 CT Abd/Pelvis: Multi vessel coronary vascular Ca2+.   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 03/2017 Echo: EF 55-60%, mild LVH, nl RV fxn.   Hyperlipidemia    Ischemic cardiomyopathy    a. 04/2018 Echo: EF 30-35%, Gr2 DD, mild LVH. Mild MR. Mildly dil LA, mod dil RV w/ mod red RV fxn, mild TR, PASP 6mmHg.   NSTEMI (non-ST elevated myocardial infarction) ([place])    a. 05/2018 in setting of Afib, sepsis, and post-op L AKA. Peak trop 7.7. EF 30-35% by echo-->cath not performed 2/2 renal failure.   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 S/p L fem-pop bypass; b. 11/2017 s/p Aortobifem bypass 2/2 graft occlusion; c. 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   Paroxysmal atrial fibrillation with rapid ventricular response ([place]) 12/02/2017   a. CHA2DS2VASc = 3-->Xarelto; b. 05/2018 Recurrent Afib-->amio.   Renal insufficiency    Ulcer    Foot    Past Surgical  History:  Procedure Laterality Date   ABDOMINAL AORTAGRAM  June 15, 2014   ABDOMINAL AORTAGRAM N/A 06/15/2014   Procedure: ABDOMINAL Maxcine Ham;  Surgeon: Serafina Mitchell, MD;  Location: [facility] CATH LAB;  Service: Cardiovascular;  Laterality: N/A;   ABDOMINAL AORTAGRAM N/A 11/22/2014   Procedure: ABDOMINAL Maxcine Ham;  Surgeon: Serafina Mitchell, MD;  Location: [facility] CATH LAB;  Service: Cardiovascular;  Laterality: N/A;   ABDOMINAL AORTOGRAM W/LOWER EXTREMITY N/A 01/07/2017   Procedure: Abdominal Aortogram w/Lower Extremity;  Surgeon: Serafina Mitchell, MD;  Location: [facility] CV LAB;  Service: Cardiovascular;  Laterality: N/A;   ABDOMINAL AORTOGRAM W/LOWER EXTREMITY N/A 10/31/2017   Procedure: ABDOMINAL AORTOGRAM W/LOWER EXTREMITY;  Surgeon: Elam Dutch, MD;  Location: [place] CV LAB;  Service: Cardiovascular;  Laterality: N/A;   ABDOMINAL AORTOGRAM W/LOWER EXTREMITY N/A 03/24/2018   Procedure: ABDOMINAL AORTOGRAM W/LOWER EXTREMITY;  Surgeon: Serafina Mitchell, MD;  Location: [place] CV LAB;  Service: Cardiovascular;  Laterality: N/A;   AMPUTATION Left 04/26/2018   Procedure: AMPUTATION ABOVE KNEE;  Surgeon: Elam Dutch, MD;  Location: [facility] OR;  Service: Vascular;  Laterality: Left;   AORTA - BILATERAL FEMORAL ARTERY BYPASS GRAFT N/A 11/28/2017   Procedure: AORTA BIFEMORAL BYPASS USING HEMASHIELD GOLD GRAFT & REIMPLANT IMA;  Surgeon: Serafina Mitchell, MD;  Location: [facility] OR;  Service: Vascular;  Laterality: N/A;   AORTIC ARCH ANGIOGRAPHY N/A 10/31/2017   Procedure: AORTIC ARCH ANGIOGRAPHY;  Surgeon: Elam Dutch, MD;  Location: [place] CV LAB;  Service: Cardiovascular;  Laterality: N/A;   APPLICATION OF WOUND VAC  11/28/2017   Procedure: APPLICATION OF WOUND VAC;  Surgeon: Serafina Mitchell, MD;  Location: [place];  Service: Vascular;;   APPLICATION OF WOUND VAC Left 03/27/2018   Procedure: APPLICATION OF WOUND VAC LEFT GROIN USING [place];  Surgeon: Serafina Mitchell, MD;  Location: [place];  Service: Vascular;  Laterality: Left;   ARTERIAL BYPASS SURGERY   07/05/2010   Right Common Femoral to below knee popliteal BPG   BACK SURGERY     X's  2   BIOPSY  03/13/2021   Procedure: BIOPSY;  Surgeon: Harvel Quale, MD;  [facility];  Service: Gastroenterology;;   CARDIAC CATHETERIZATION     CHOLECYSTECTOMY     Gall Bladder   COLONOSCOPY WITH PROPOFOL N/A 03/13/2021   Procedure: COLONOSCOPY WITH PROPOFOL;  Surgeon: Harvel Quale, MD;  [facility];  Service: Gastroenterology;  Laterality: N/A;  with random colonic biopsies   CYSTECTOMY Left    wrist   EMBOLECTOMY Left 11/28/2017   Procedure: Left Lower Extremity Embolectomy, Left Tibial Peroneal Trunk Endarterectomy with Patch Angioplasty; Vein Harvest Small Saphenous Graft Left Lower Leg;  Surgeon: [name], MD;  Location: [place];  Service: Vascular;  Laterality: Left;   ESOPHAGOGASTRODUODENOSCOPY (EGD) WITH PROPOFOL N/A 03/13/2021   Procedure: ESOPHAGOGASTRODUODENOSCOPY (EGD) WITH PROPOFOL;  Surgeon: Harvel Quale, MD;  [facility];  Service: Gastroenterology;  Laterality: N/A;   EYE SURGERY Left 02/23/2020   Dr. Zadie Rhine  FEMORAL-POPLITEAL BYPASS GRAFT Left 03/27/2018   Procedure: THROMBECTOMY OF LEFT FEMORAL TIBIAL BYPASS;  Surgeon: Serafina Mitchell, MD;  Location: [facility] OR;  Service: Vascular;  Laterality: Left;   FEMORAL-TIBIAL BYPASS GRAFT Left 03/27/2018   Procedure: BYPASS GRAFT FEMORAL-TIBIAL ARTERY LEFT REDO USING CRYOPRESERVED SAPHENOUS VEIN 70cm;  Surgeon: Serafina Mitchell, MD;  Location: [facility] OR;  Service: Vascular;  Laterality: Left;   INTERCOSTAL NERVE BLOCK  November 2015   INTRAOPERATIVE ARTERIOGRAM  11/28/2017   Procedure: INTRA OPERATIVE ARTERIOGRAM OF LEFT LEG;  Surgeon: Serafina Mitchell, MD;  Location: [place];  Service: Vascular;;   INTRAOPERATIVE ARTERIOGRAM Left 03/27/2018   Procedure: INTRA OPERATIVE ARTERIOGRAM TIMES TWO;  Surgeon:  Serafina Mitchell, MD;  Location: [facility] OR;  Service: Vascular;  Laterality: Left;   IR FLUORO GUIDE CV LINE RIGHT  03/20/2017   IR FLUORO GUIDE CV LINE RIGHT  05/05/2018   IR REMOVAL TUN CV CATH W/O FL  05/20/2018   IR US GUIDE VASC ACCESS RIGHT  03/20/2017   IR US GUIDE VASC ACCESS RIGHT  05/05/2018   IRRIGATION AND DEBRIDEMENT BUTTOCKS Right 09/30/2016   Procedure: DEBRIDEMENT RIGHT  BUTTOCK WOUND;  Surgeon: Georganna Skeans, MD;  Location: [place];  Service: General;  Laterality: Right;   left foot surgery     PERIPHERAL VASCULAR CATHETERIZATION N/A 05/07/2016   Procedure: Abdominal Aortogram;  Surgeon: Serafina Mitchell, MD;  Location: [place] CV LAB;  Service: Cardiovascular;  Laterality: N/A;   PERIPHERAL VASCULAR CATHETERIZATION N/A 05/07/2016   Procedure: Lower Extremity Angiography;  Surgeon: Serafina Mitchell, MD;  Location: [place] CV LAB;  Service: Cardiovascular;  Laterality: N/A;   PERIPHERAL VASCULAR CATHETERIZATION N/A 05/07/2016   Procedure: Aortic Arch Angiography;  Surgeon: Serafina Mitchell, MD;  Location: [place] CV LAB;  Service: Cardiovascular;  Laterality: N/A;   PERIPHERAL VASCULAR CATHETERIZATION N/A 05/07/2016   Procedure: Upper Extremity Angiography;  Surgeon: Serafina Mitchell, MD;  Location: [place] CV LAB;  Service: Cardiovascular;  Laterality: N/A;   PERIPHERAL VASCULAR CATHETERIZATION Right 05/07/2016   Procedure: Peripheral Vascular Balloon Angioplasty;  Surgeon: Serafina Mitchell, MD;  Location: [place] CV LAB;  Service: Cardiovascular;  Laterality: Right;  subclavian   PERIPHERAL VASCULAR CATHETERIZATION Right 05/07/2016   Procedure: Peripheral Vascular Intervention;  Surgeon: Serafina Mitchell, MD;  Location: [place] CV LAB;  Service: Cardiovascular;  Laterality: Right;  External  Iliac   POLYPECTOMY  03/13/2021   Procedure: POLYPECTOMY;  Surgeon: Harvel Quale, MD;  [facility];  Service: Gastroenterology;;   SKIN GRAFT Right 2012   [name] by  Dr. Nils Pyle- Right and Left Ankle   SPINE SURGERY     THROMBECTOMY FEMORAL ARTERY  11/28/2017   Procedure: THROMBECTOMY  & REVISION OF BILATERAL FEMORAL TO [place];  Surgeon: Serafina Mitchell, MD;  Location: [place];  Service: Vascular;;   TONSILLECTOMY     TRANSMETATARSAL AMPUTATION Right 10/07/2020   Procedure: RIGHT BELOW KNEE AMPUTATION;  Surgeon: Erle Crocker, MD;  Location: [place];  Service: Orthopedics;  Laterality: Right;    Current Outpatient Medications  Medication Sig Dispense Refill   albuterol (PROVENTIL) 2 MG tablet Take 1 tablet (2 mg total) by mouth daily as needed for shortness of breath. 30 tablet 0   amiodarone (PACERONE) 200 MG tablet Take 1 tablet (200 mg total) by mouth See admin instructions. oral amiodarone 400mg  bid x 7 days, then 200mg  bid x 2 weeks, then 200mg  daily after that  90 tablet 0   amitriptyline (ELAVIL) 25 MG tablet Take 1 tablet (25 mg total) by mouth at bedtime. 30 tablet 0   Balsam [place]-Castor Oil (VENELEX) OINT Apply 1 application topically 3 (three) times daily. Every shift to bilateral buttocks, coccyx, and sacrum     cholestyramine light (PREVALITE) 4 g packet Take 1 packet (4 g total) by mouth 2 (two) times daily. Take either 2 hours before or 2 hours after usual medications are taken. 60 packet 1   Choline Fenofibrate (FENOFIBRIC ACID) 45 MG CPDR Take 1 capsule by mouth daily.     dicyclomine (BENTYL) 10 MG capsule Take 1 capsule (10 mg total) by mouth every 6 (six) hours as needed for spasms. (Patient not taking: Reported on 04/02/2021) 30 capsule 0   diphenoxylate-atropine (LOMOTIL) 2.5-0.025 MG tablet Take 1 tablet by mouth 3 (three) times daily. 90 tablet 1   insulin lispro (HUMALOG KWIKPEN) 100 UNIT/ML KwikPen Inject 4-10 Units into the skin 3 (three) times daily with meals. If blood sugar 121-150, 2 units If blood sugar 151-200, 4 units If blood sugar 201-250, 6 units If blood sugar 251-300, 8 units If blood sugar 301-500, 10 units  If blood sugar greater than 350 call MD 15 mL 0   Insulin Syringe-Needle U-100 (INSULIN SYRINGE 1CC/31GX5/16") 31G X 5/16" 1 ML MISC For use with administration of octreotide twice daily. 100 each 1   lipase/protease/amylase (CREON) 36000 UNITS CPEP capsule Take 3 capsules (108,000 Units total) by mouth 3 (three) times daily with meals AND 2 capsules (72,000 Units total) with snacks. 390 capsule 11   metoprolol tartrate (LOPRESSOR) 25 MG tablet Take 0.5 tablets (12.5 mg total) by mouth 2 (two) times daily. 30 tablet 0   naloxone (NARCAN) nasal spray 4 mg/0.1 mL Place 1 spray into the nose once as needed (opioid overdose). 1 each 0   nicotine (NICODERM CQ - DOSED IN MG/24 HOURS) 21 mg/24hr patch Place 21 mg onto the skin daily. Rotate sites and remove old patch prior to application     omeprazole (PRILOSEC) 40 MG capsule Take 1 capsule (40 mg total) by mouth daily. 30 capsule 0   Potassium Chloride ER 20 MEQ TBCR Take 20 mEq by mouth daily. 1 tab daily by mouth 30 tablet 0   pregabalin (LYRICA) 100 MG capsule Take 1 capsule (100 mg total) by mouth 2 (two) times daily. 60 capsule 0   Rivaroxaban (XARELTO) 15 MG TABS tablet Take 1 tablet (15 mg total) by mouth daily with supper. 30 tablet 0   rosuvastatin (CRESTOR) 20 MG tablet Take 1 tablet (20 mg total) by mouth daily at 6 PM. 30 tablet 0   sodium bicarbonate 650 MG tablet Take 1 tablet (650 mg total) by mouth 2 (two) times daily. 60 tablet 0   torsemide (DEMADEX) 20 MG tablet Take 1 tablet (20 mg total) by mouth daily. 30 tablet 0   vitamin B-12 1000 MCG tablet Take 1 tablet (1,000 mcg total) by mouth daily. (Patient not taking: Reported on 04/02/2021) 30 tablet 0   No current facility-administered medications for this visit.    Allergies as of 07/02/2021 - Review Complete 04/05/2021  Allergen Reaction Noted   Lactose intolerance (gi) Diarrhea 05/04/2018    Family History  Problem Relation Age of Onset   Coronary artery disease Mother     Peripheral vascular disease Mother    Heart disease Mother        Before age 66   Other Mother  Venous insuffiency   Diabetes Mother    Hyperlipidemia Mother    Hypertension Mother    Varicose Veins Mother    Heart attack Mother        before age 37   Heart disease Father    Diabetes Father    Diabetes Maternal Grandmother    Diabetes Paternal Grandmother    Diabetes Paternal Grandfather    Diabetes Sister    Hypertension Sister    Diabetes Brother    Hypertension Brother     Social History   Socioeconomic History   Marital status: Married    Spouse name: Not on file   Number of children: Not on file   Years of education: Not on file   Highest education level: Not on file  Occupational History   Occupation: disabled  Tobacco Use   Smoking status: Every Day    Packs/day: 0.50    Years: 30.00    Pack years: 15.00    Types: Cigarettes   Smokeless tobacco: Never  Vaping Use   Vaping Use: Never used  Substance and Sexual Activity   Alcohol use: No    Alcohol/week: 0.0 standard drinks   Drug use: No   Sexual activity: Yes    Birth control/protection: Post-menopausal  Other Topics Concern   Not on file  Social History Narrative   Not on file   Social Determinants of Health   Financial Resource Strain: Not on file  Food Insecurity: Not on file  Transportation Needs: Not on file  Physical Activity: Not on file  Stress: Not on file  Social Connections: Not on file    Review of Systems: Gen: Denies fever, chills, anorexia. Denies fatigue, weakness, weight loss.  CV: Denies chest pain, palpitations, syncope, peripheral edema, and claudication. Resp: Denies dyspnea at rest, cough, wheezing, coughing up blood, and pleurisy. GI: Denies vomiting blood, jaundice, and fecal incontinence.   Denies dysphagia or odynophagia. Derm: Denies rash, itching, dry skin Psych: Denies depression, anxiety, memory loss, confusion. No homicidal or suicidal ideation.  Heme:  Denies bruising, bleeding, and enlarged lymph nodes.  Physical Exam: There were no vitals taken for this visit. General:   Alert and oriented. No distress noted. Pleasant and cooperative.  Head:  Normocephalic and atraumatic. Eyes:  Conjuctiva clear without scleral icterus. Mouth:  Oral mucosa pink and moist. Good dentition. No lesions. Heart:  S1, S2 present without murmurs appreciated. Lungs:  Clear to auscultation bilaterally. No wheezes, rales, or rhonchi. No distress.  Abdomen:  +BS, soft, non-tender and non-distended. No rebound or guarding. No HSM or masses noted. Msk:  Symmetrical without gross deformities. Normal posture. Extremities:  Without edema. Neurologic:  Alert and  oriented x4 Psych:  Alert and cooperative. Normal mood and affect.

## 2021-07-02 ENCOUNTER — Ambulatory Visit: Payer: Medicare Other | Admitting: Gastroenterology

## 2021-07-02 ENCOUNTER — Encounter: Payer: Self-pay | Admitting: Internal Medicine

## 2021-07-11 ENCOUNTER — Encounter (HOSPITAL_COMMUNITY): Payer: Self-pay | Admitting: *Deleted

## 2021-07-11 ENCOUNTER — Emergency Department (HOSPITAL_COMMUNITY)
Admission: EM | Admit: 2021-07-11 | Discharge: 2021-07-11 | Disposition: A | Discharge status: Home / Self Care | Payer: Medicare Other | Attending: Emergency Medicine | Admitting: Emergency Medicine

## 2021-07-11 ENCOUNTER — Other Ambulatory Visit: Payer: Self-pay

## 2021-07-11 DIAGNOSIS — Z5321 Procedure and treatment not carried out due to patient leaving prior to being seen by health care provider: Secondary | ICD-10-CM | POA: Insufficient documentation

## 2021-07-11 DIAGNOSIS — Z4800 Encounter for change or removal of nonsurgical wound dressing: Secondary | ICD-10-CM | POA: Insufficient documentation

## 2021-07-11 NOTE — ED Notes (Signed)
Pt has been continuously requesting a bed and bedpan while in waiting room I offered pt a bedpan to use in the bathroom pt. Husband then begin yelling stating that we are going have a mess to clean up that we better do something.

## 2021-07-11 NOTE — ED Notes (Signed)
In triage pt. Stated she needed to use the bathroom I offered to help her in to restroom pt. Refused and stated she needed a bed and a bedpan.

## 2021-07-11 NOTE — ED Triage Notes (Signed)
Pt with wounds to bottom for a long time, pt states it is bedsores and has opened up.  Pt has bilateral leg amputations.

## 2021-07-15 ENCOUNTER — Other Ambulatory Visit: Payer: Self-pay

## 2021-07-15 ENCOUNTER — Emergency Department (HOSPITAL_COMMUNITY)
Admission: EM | Admit: 2021-07-15 | Discharge: 2021-07-16 | Disposition: A | Discharge status: Home / Self Care | Payer: Medicare Other | Attending: Emergency Medicine | Admitting: Emergency Medicine

## 2021-07-15 ENCOUNTER — Emergency Department (HOSPITAL_COMMUNITY): Payer: Medicare Other

## 2021-07-15 ENCOUNTER — Encounter (HOSPITAL_COMMUNITY): Payer: Self-pay | Admitting: Emergency Medicine

## 2021-07-15 DIAGNOSIS — S42034A Nondisplaced fracture of lateral end of right clavicle, initial encounter for closed fracture: Secondary | ICD-10-CM | POA: Diagnosis not present

## 2021-07-15 DIAGNOSIS — S2231XA Fracture of one rib, right side, initial encounter for closed fracture: Secondary | ICD-10-CM | POA: Diagnosis not present

## 2021-07-15 DIAGNOSIS — N189 Chronic kidney disease, unspecified: Secondary | ICD-10-CM

## 2021-07-15 DIAGNOSIS — Z794 Long term (current) use of insulin: Secondary | ICD-10-CM | POA: Insufficient documentation

## 2021-07-15 DIAGNOSIS — D631 Anemia in chronic kidney disease: Secondary | ICD-10-CM | POA: Insufficient documentation

## 2021-07-15 DIAGNOSIS — F17210 Nicotine dependence, cigarettes, uncomplicated: Secondary | ICD-10-CM | POA: Insufficient documentation

## 2021-07-15 DIAGNOSIS — I509 Heart failure, unspecified: Secondary | ICD-10-CM | POA: Insufficient documentation

## 2021-07-15 DIAGNOSIS — E1122 Type 2 diabetes mellitus with diabetic chronic kidney disease: Secondary | ICD-10-CM | POA: Diagnosis not present

## 2021-07-15 DIAGNOSIS — I130 Hypertensive heart and chronic kidney disease with heart failure and stage 1 through stage 4 chronic kidney disease, or unspecified chronic kidney disease: Secondary | ICD-10-CM | POA: Diagnosis not present

## 2021-07-15 DIAGNOSIS — W052XXA Fall from non-moving motorized mobility scooter, initial encounter: Secondary | ICD-10-CM | POA: Insufficient documentation

## 2021-07-15 DIAGNOSIS — N184 Chronic kidney disease, stage 4 (severe): Secondary | ICD-10-CM | POA: Diagnosis not present

## 2021-07-15 DIAGNOSIS — S0990XA Unspecified injury of head, initial encounter: Secondary | ICD-10-CM | POA: Diagnosis not present

## 2021-07-15 DIAGNOSIS — S4991XA Unspecified injury of right shoulder and upper arm, initial encounter: Secondary | ICD-10-CM | POA: Diagnosis present

## 2021-07-15 DIAGNOSIS — D649 Anemia, unspecified: Secondary | ICD-10-CM

## 2021-07-15 NOTE — ED Triage Notes (Signed)
Pt brought in by [facility] after she fell out of her mobility scooter, landing on R shoulder. Here with c/o R shoulder pain.

## 2021-07-16 ENCOUNTER — Emergency Department (HOSPITAL_COMMUNITY): Payer: Medicare Other

## 2021-07-16 LAB — CBC WITH DIFFERENTIAL/PLATELET
Abs Immature Granulocytes: 0.03 10*3/uL (ref 0.00–0.07)
Basophils Absolute: 0 10*3/uL (ref 0.0–0.1)
Basophils Relative: 0 %
Eosinophils Absolute: 0.1 10*3/uL (ref 0.0–0.5)
Eosinophils Relative: 2 %
HCT: 25.9 % — ABNORMAL LOW (ref 36.0–46.0)
Hemoglobin: 7.8 g/dL — ABNORMAL LOW (ref 12.0–15.0)
Immature Granulocytes: 1 %
Lymphocytes Relative: 19 %
Lymphs Abs: 1.1 10*3/uL (ref 0.7–4.0)
MCH: 26.4 pg (ref 26.0–34.0)
MCHC: 30.1 g/dL (ref 30.0–36.0)
MCV: 87.8 fL (ref 80.0–100.0)
Monocytes Absolute: 0.5 10*3/uL (ref 0.1–1.0)
Monocytes Relative: 8 %
Neutro Abs: 4.2 10*3/uL (ref 1.7–7.7)
Neutrophils Relative %: 70 %
Platelets: 198 10*3/uL (ref 150–400)
RBC: 2.95 MIL/uL — ABNORMAL LOW (ref 3.87–5.11)
RDW: 16.9 % — ABNORMAL HIGH (ref 11.5–15.5)
WBC: 5.9 10*3/uL (ref 4.0–10.5)
nRBC: 0 % (ref 0.0–0.2)

## 2021-07-16 LAB — BASIC METABOLIC PANEL
Anion gap: 6 (ref 5–15)
BUN: 31 mg/dL — ABNORMAL HIGH (ref 6–20)
CO2: 25 mmol/L (ref 22–32)
Calcium: 6.5 mg/dL — ABNORMAL LOW (ref 8.9–10.3)
Chloride: 105 mmol/L (ref 98–111)
Creatinine, Ser: 2.5 mg/dL — ABNORMAL HIGH (ref 0.44–1.00)
GFR, Estimated: 22 mL/min — ABNORMAL LOW (ref >60)
Glucose, Bld: 114 mg/dL — ABNORMAL HIGH (ref 70–99)
Potassium: 3 mmol/L — ABNORMAL LOW (ref 3.5–5.1)
Sodium: 136 mmol/L (ref 135–145)

## 2021-07-16 MED ORDER — LACTATED RINGERS IV BOLUS
1000.0000 mL | Freq: Once | INTRAVENOUS | Status: AC
  Administered 2021-07-16: 1000 mL via INTRAVENOUS

## 2021-07-16 MED ORDER — FENTANYL CITRATE PF 50 MCG/ML IJ SOSY
50.0000 ug | PREFILLED_SYRINGE | Freq: Once | INTRAMUSCULAR | Status: AC
  Administered 2021-07-16: 50 ug via INTRAVENOUS
  Filled 2021-07-16: qty 1

## 2021-07-16 MED ORDER — OXYCODONE-ACETAMINOPHEN 5-325 MG PO TABS: 1.0000 | ORAL_TABLET | Freq: Four times a day (QID) | ORAL | 0 refills | Status: AC | PRN

## 2021-07-16 NOTE — ED Provider Notes (Signed)
[facility] EMERGENCY DEPARTMENT Provider Note   CSN: [id number] Arrival date & time: 07/15/21  2210     History Chief Complaint  Patient presents with   Shoulder Pain    [name] is a 55 y.o. female.   Shoulder Pain Location:  Shoulder Shoulder location:  R shoulder Injury: yes   Mechanism of injury: fall   Fall:    Fall occurred: from a motorized wheelchair.   Impact surface:  Hard floor   Entrapped after fall: no   Pain details:    Quality:  Sharp   Radiates to:  R shoulder and chest   Severity:  Moderate   Onset quality:  Sudden   Timing:  Constant Dislocation: no   Foreign body present:  No foreign bodies     Past Medical History:  Diagnosis Date   Abnormal stress test    a. 02/2017 MV: large region of fixed perfusion defect in basal to mid inf, mid-dist inflat walls, EF 43%. No ischemia (EF 55-60% by f/u echo).   Arthritis    Asthma    Carotid arterial disease ([place])    a. 09/2017 Carotid U/S: 40-49% bilat ICA stenosis.   Chronic back pain    Coronary artery calcification seen on CT scan    a. 11/2017 CT Abd/Pelvis: Multi vessel coronary vascular Ca2+.   Depression    Diabetes mellitus    Diabetic neuropathy ([place])    Difficult intubation    DIFFICULT AIRWAY/FYI   Family history of adverse reaction to anesthesia    mother had difficlty waking    Femoral-popliteal bypass graft occlusion, left ([place]) 12/02/2017   GERD (gastroesophageal reflux disease)    History of echocardiogram    a. 03/2017 Echo: EF 55-60%, mild LVH, nl RV fxn.   Hyperlipidemia    Ischemic cardiomyopathy    a. 04/2018 Echo: EF 30-35%, Gr2 DD, mild LVH. Mild MR. Mildly dil LA, mod dil RV w/ mod red RV fxn, mild TR, PASP 72mmHg.   NSTEMI (non-ST elevated myocardial infarction) ([place])    a. 05/2018 in setting of Afib, sepsis, and post-op L AKA. Peak trop 7.7. EF 30-35% by echo-->cath not performed 2/2 renal failure.   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 S/p L fem-pop bypass; b. 11/2017 s/p Aortobifem bypass 2/2 graft occlusion; c. 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   Paroxysmal atrial fibrillation with rapid ventricular response ([place]) 12/02/2017   a. CHA2DS2VASc = 3-->Xarelto; b. 05/2018 Recurrent Afib-->amio.   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 ([place]) 12/05/2020   CKD stage 4 due to type 2 diabetes mellitus ([place]) 12/05/2020   Hyperlipidemia associated with type 2 diabetes mellitus ([place]) 12/05/2020   Controlled type 2 diabetes mellitus with stage 4 chronic kidney disease, with long-term current use of insulin (  [place]) 12/05/2020   S/P BKA (below knee amputation) unilateral, right ([place]) 12/05/2020   Anemia due to stage 4 chronic kidney disease ([place]) 12/05/2020   Leukopenia 11/29/2020   Elevated brain natriuretic peptide (BNP) level 11/29/2020   Elevated d-dimer 11/29/2020   Prolonged QT interval 11/29/2020   Stable treated proliferative diabetic retinopathy of left eye determined by examination associated with type 2 diabetes mellitus ([place]) 08/14/2020   Macular pucker, left eye 02/24/2020   Follow-up examination after eye surgery 02/24/2020   Coronary artery disease involving native coronary artery of native heart without angina pectoris 11/21/2018   Ischemic cardiomyopathy 11/21/2018   Essential hypertension  11/21/2018   Labile blood pressure    Diabetic retinopathy associated with type 2 diabetes mellitus ([place])    Hypoalbuminemia due to protein-calorie malnutrition ([place])    Type 2 diabetes mellitus with peripheral neuropathy (HCC)    Chronic combined systolic (congestive) and diastolic (congestive) heart failure (HCC)    Unilateral AKA, left (HCC)    Atrial fibrillation with rapid ventricular response (HCC)    Diabetic peripheral neuropathy (HCC)    Anemia of chronic disease    Gastroesophageal reflux disease    Non-ST elevation (NSTEMI) myocardial infarction ([place])    Acute combined systolic and diastolic heart failure ([place])    Acute respiratory failure with hypoxia (HCC)    Microcytic anemia 04/25/2018   Pulmonary nodule 04/25/2018   Gangrene of lower extremity ([place]) 04/25/2018   Femoral-popliteal bypass graft occlusion, left ([place]) 12/02/2017   Respiratory failure with hypoxia ([place]) 12/02/2017   Leukocytosis 12/02/2017   Paroxysmal atrial fibrillation with rapid ventricular response ([place]) 12/02/2017   Hyperkalemia 06/26/2017   Elevated lactic acid level    Chronic back pain 11/27/2016   Hyperlipidemia LDL goal <70    Retinal tumor of right eye    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   Paresthesia of both hands 03/05/2016   DDD (degenerative disc disease), lumbosacral 03/26/2015   DDD (degenerative disc disease), cervical 03/26/2015   Sacroiliac joint disease 03/26/2015   Occipital neuralgia 03/26/2015   Pain in limb-Bilateral Leg 04/25/2014   Peripheral vascular disease ([place]) 04/05/2013   Chronic total occlusion of artery of the extremities ([place]) 02/10/2012    Past Surgical History:  Procedure Laterality Date   ABDOMINAL AORTAGRAM  June 15, 2014   ABDOMINAL AORTAGRAM N/A 06/15/2014   Procedure: ABDOMINAL Maxcine Ham;  Surgeon: Serafina Mitchell, MD;  Location: [facility] CATH LAB;  Service: Cardiovascular;  Laterality: N/A;   ABDOMINAL AORTAGRAM N/A 11/22/2014   Procedure:  ABDOMINAL Maxcine Ham;  Surgeon: Serafina Mitchell, MD;  Location: [facility] CATH LAB;  Service: Cardiovascular;  Laterality: N/A;   ABDOMINAL AORTOGRAM W/LOWER EXTREMITY N/A 01/07/2017   Procedure: Abdominal Aortogram w/Lower Extremity;  Surgeon: Serafina Mitchell, MD;  Location: [place] CV LAB;  Service: Cardiovascular;  Laterality: N/A;   ABDOMINAL AORTOGRAM W/LOWER EXTREMITY N/A 10/31/2017   Procedure: ABDOMINAL AORTOGRAM W/LOWER EXTREMITY;  Surgeon: Elam Dutch, MD;  Location: [place] CV LAB;  Service: Cardiovascular;  Laterality: N/A;   ABDOMINAL AORTOGRAM W/LOWER EXTREMITY N/A 03/24/2018   Procedure: ABDOMINAL AORTOGRAM W/LOWER EXTREMITY;  Surgeon: Serafina Mitchell, MD;  Location: [place] CV LAB;  Service: Cardiovascular;  Laterality: N/A;   AMPUTATION Left 04/26/2018   Procedure: AMPUTATION ABOVE KNEE;  Surgeon: Elam Dutch, MD;  Location: [facility] OR;  Service: Vascular;  Laterality: Left;   AORTA - BILATERAL FEMORAL ARTERY BYPASS GRAFT N/A 11/28/2017   Procedure: AORTA BIFEMORAL BYPASS USING HEMASHIELD GOLD GRAFT &  REIMPLANT IMA;  Surgeon: Serafina Mitchell, MD;  Location: [facility] OR;  Service: Vascular;  Laterality: N/A;   AORTIC ARCH ANGIOGRAPHY N/A 10/31/2017   Procedure: AORTIC ARCH ANGIOGRAPHY;  Surgeon: Elam Dutch, MD;  Location: [name] CV LAB;  Service: Cardiovascular;  Laterality: N/A;   APPLICATION OF WOUND VAC  11/28/2017   Procedure: APPLICATION OF WOUND VAC;  Surgeon: Serafina Mitchell, MD;  Location: [place];  Service: Vascular;;   APPLICATION OF WOUND VAC Left 03/27/2018   Procedure: APPLICATION OF WOUND VAC LEFT GROIN USING [place];  Surgeon: Serafina Mitchell, MD;  Location: [place];  Service: Vascular;  Laterality: Left;   ARTERIAL BYPASS SURGERY   07/05/2010   Right Common Femoral to below knee popliteal BPG   BACK SURGERY     X's  2   BIOPSY  03/13/2021   Procedure: BIOPSY;  Surgeon: Harvel Quale, MD;  [facility]: [facility] END[facility];  Service:  Gastroenterology;;   CARDIAC CATHETERIZATION     CHOLECYSTECTOMY     Gall Bladder   COLONOSCOPY WITH PROPOFOL N/A 03/13/2021   Procedure: COLONOSCOPY WITH PROPOFOL;  Surgeon: Harvel Quale, MD;  Location: [facility] END[facility];  Service: Gastroenterology;  Laterality: N/A;  with random colonic biopsies   CYSTECTOMY Left    wrist   EMBOLECTOMY Left 11/28/2017   Procedure: Left Lower Extremity Embolectomy, Left Tibial Peroneal Trunk Endarterectomy with Patch Angioplasty; Vein Harvest Small Saphenous Graft Left Lower Leg;  Surgeon: [name], MD;  Location: [place];  Service: Vascular;  Laterality: Left;   ESOPHAGOGASTRODUODENOSCOPY (EGD) WITH PROPOFOL N/A 03/13/2021   Procedure: ESOPHAGOGASTRODUODENOSCOPY (EGD) WITH PROPOFOL;  Surgeon: Harvel Quale, MD;  [facility];  Service: Gastroenterology;  Laterality: N/A;   EYE SURGERY Left 02/23/2020   Dr. Zadie Rhine   FEMORAL-POPLITEAL BYPASS GRAFT Left 03/27/2018   Procedure: THROMBECTOMY OF LEFT FEMORAL TIBIAL BYPASS;  Surgeon: Serafina Mitchell, MD;  [facility]: [facility] OR;  Service: Vascular;  Laterality: Left;   FEMORAL-TIBIAL BYPASS GRAFT Left 03/27/2018   Procedure: BYPASS GRAFT FEMORAL-TIBIAL ARTERY LEFT REDO USING CRYOPRESERVED SAPHENOUS VEIN 70cm;  Surgeon: Serafina Mitchell, MD;  Location: [facility] OR;  Service: Vascular;  Laterality: Left;   INTERCOSTAL NERVE BLOCK  November 2015   INTRAOPERATIVE ARTERIOGRAM  11/28/2017   Procedure: INTRA OPERATIVE ARTERIOGRAM OF LEFT LEG;  Surgeon: Serafina Mitchell, MD;  Location: [name];  Service: Vascular;;   INTRAOPERATIVE ARTERIOGRAM Left 03/27/2018   Procedure: INTRA OPERATIVE ARTERIOGRAM TIMES TWO;  Surgeon: Serafina Mitchell, MD;  Location: [facility] OR;  Service: Vascular;  Laterality: Left;   IR FLUORO GUIDE CV LINE RIGHT  03/20/2017   IR FLUORO GUIDE CV LINE RIGHT  05/05/2018   IR REMOVAL TUN CV CATH W/O FL  05/20/2018   IR US GUIDE VASC ACCESS RIGHT  03/20/2017   IR US GUIDE VASC ACCESS  RIGHT  05/05/2018   IRRIGATION AND DEBRIDEMENT BUTTOCKS Right 09/30/2016   Procedure: DEBRIDEMENT RIGHT  BUTTOCK WOUND;  Surgeon: Georganna Skeans, MD;  Location: [place];  Service: General;  Laterality: Right;   left foot surgery     PERIPHERAL VASCULAR CATHETERIZATION N/A 05/07/2016   Procedure: Abdominal Aortogram;  Surgeon: Serafina Mitchell, MD;  Location: [place] CV LAB;  Service: Cardiovascular;  Laterality: N/A;   PERIPHERAL VASCULAR CATHETERIZATION N/A 05/07/2016   Procedure: Lower Extremity Angiography;  Surgeon: Serafina Mitchell, MD;  Location: [place] CV LAB;  Service: Cardiovascular;  Laterality: N/A;   PERIPHERAL VASCULAR  CATHETERIZATION N/A 05/07/2016   Procedure: Aortic Arch Angiography;  Surgeon: Serafina Mitchell, MD;  Location: [place] CV LAB;  Service: Cardiovascular;  Laterality: N/A;   PERIPHERAL VASCULAR CATHETERIZATION N/A 05/07/2016   Procedure: Upper Extremity Angiography;  Surgeon: Serafina Mitchell, MD;  Location: [place] CV LAB;  Service: Cardiovascular;  Laterality: N/A;   PERIPHERAL VASCULAR CATHETERIZATION Right 05/07/2016   Procedure: Peripheral Vascular Balloon Angioplasty;  Surgeon: Serafina Mitchell, MD;  Location: [place] CV LAB;  Service: Cardiovascular;  Laterality: Right;  subclavian   PERIPHERAL VASCULAR CATHETERIZATION Right 05/07/2016   Procedure: Peripheral Vascular Intervention;  Surgeon: Serafina Mitchell, MD;  Location: [place] CV LAB;  Service: Cardiovascular;  Laterality: Right;  External  Iliac   POLYPECTOMY  03/13/2021   Procedure: POLYPECTOMY;  Surgeon: Harvel Quale, MD;  [facility];  Service: Gastroenterology;;   SKIN GRAFT Right 2012   [name] by Dr. Nils Pyle- Right and Left Ankle   SPINE SURGERY     THROMBECTOMY FEMORAL ARTERY  11/28/2017   Procedure: THROMBECTOMY  & REVISION OF BILATERAL FEMORAL TO [place];  Surgeon: Serafina Mitchell, MD;  Location: [place];  Service: Vascular;;   TONSILLECTOMY      TRANSMETATARSAL AMPUTATION Right 10/07/2020   Procedure: RIGHT BELOW KNEE AMPUTATION;  Surgeon: Erle Crocker, MD;  Location: [place];  Service: Orthopedics;  Laterality: Right;     OB History   No obstetric history on file.     Family History  Problem Relation Age of Onset   Coronary artery disease Mother    Peripheral vascular disease Mother    Heart disease Mother        Before age 14   Other Mother        Venous insuffiency   Diabetes Mother    Hyperlipidemia Mother    Hypertension Mother    Varicose Veins Mother    Heart attack Mother        before age 27   Heart disease Father    Diabetes Father    Diabetes Maternal Grandmother    Diabetes Paternal Grandmother    Diabetes Paternal Grandfather    Diabetes Sister    Hypertension Sister    Diabetes Brother    Hypertension Brother     Social History   Tobacco Use   Smoking status: Every Day    Packs/day: 0.50    Years: 30.00    Pack years: 15.00    Types: Cigarettes   Smokeless tobacco: Never  Vaping Use   Vaping Use: Never used  Substance Use Topics   Alcohol use: No    Alcohol/week: 0.0 standard drinks   Drug use: No    Home Medications Prior to Admission medications   Medication Sig Start Date End Date Taking? Authorizing Provider  oxyCODONE-acetaminophen (PERCOCET) 5-325 MG tablet Take 1 tablet by mouth every 6 (six) hours as needed for severe pain. 07/16/21  Yes Correll Denbow, Corene Cornea, MD  albuterol (PROVENTIL) 2 MG tablet Take 1 tablet (2 mg total) by mouth daily as needed for shortness of breath. 12/05/20   Gerlene Fee, NP  amiodarone (PACERONE) 200 MG tablet Take 1 tablet (200 mg total) by mouth See admin instructions. oral amiodarone 400mg  bid x 7 days, then 200mg  bid x 2 weeks, then 200mg  daily after that 12/05/20   Gerlene Fee, NP  amitriptyline (ELAVIL) 25 MG tablet Take 1 tablet (25 mg total) by mouth at bedtime. 12/05/20   Ok Edwards  S, NP  Balsam [place]-Castor Oil (VENELEX) OINT Apply 1  application topically 3 (three) times daily. Every shift to bilateral buttocks, coccyx, and sacrum    [provider]  cholestyramine light (PREVALITE) 4 g packet Take 1 packet (4 g total) by mouth 2 (two) times daily. Take either 2 hours before or 2 hours after usual medications are taken. 04/02/21 05/02/21  Manuella Ghazi, Pratik D, DO  Choline Fenofibrate (FENOFIBRIC ACID) 45 MG CPDR Take 1 capsule by mouth daily. 01/03/21   [provider]  dicyclomine (BENTYL) 10 MG capsule Take 1 capsule (10 mg total) by mouth every 6 (six) hours as needed for spasms. Patient not taking: Reported on 04/02/2021 12/05/20   Gerlene Fee, NP  diphenoxylate-atropine (LOMOTIL) 2.5-0.025 MG tablet Take 1 tablet by mouth 3 (three) times daily. 04/19/21   Erenest Rasher, PA-C  insulin lispro (HUMALOG KWIKPEN) 100 UNIT/ML KwikPen Inject 4-10 Units into the skin 3 (three) times daily with meals. If blood sugar 121-150, 2 units If blood sugar 151-200, 4 units If blood sugar 201-250, 6 units If blood sugar 251-300, 8 units If blood sugar 301-500, 10 units If blood sugar greater than 350 call MD 12/05/20   Gerlene Fee, NP  Insulin Syringe-Needle U-100 (INSULIN SYRINGE 1CC/31GX5/16") 31G X 5/16" 1 ML MISC For use with administration of octreotide twice daily. 04/02/21   Manuella Ghazi, Pratik D, DO  lipase/protease/amylase (CREON) 36000 UNITS CPEP capsule Take 3 capsules (108,000 Units total) by mouth 3 (three) times daily with meals AND 2 capsules (72,000 Units total) with snacks. 04/19/21   Erenest Rasher, PA-C  metoprolol tartrate (LOPRESSOR) 25 MG tablet Take 0.5 tablets (12.5 mg total) by mouth 2 (two) times daily. 12/05/20   Gerlene Fee, NP  naloxone [facility]) nasal spray 4 mg/0.1 mL Place 1 spray into the nose once as needed (opioid overdose). 12/05/20   Gerlene Fee, NP  nicotine (NICODERM CQ - DOSED IN MG/24 HOURS) 21 mg/24hr patch Place 21 mg onto the skin daily. Rotate sites and remove old patch prior to  application    [provider]  omeprazole (PRILOSEC) 40 MG capsule Take 1 capsule (40 mg total) by mouth daily. 12/05/20   Gerlene Fee, NP  Potassium Chloride ER 20 MEQ TBCR Take 20 mEq by mouth daily. 1 tab daily by mouth 12/05/20   Gerlene Fee, NP  pregabalin (LYRICA) 100 MG capsule Take 1 capsule (100 mg total) by mouth 2 (two) times daily. 03/16/21   Kathie Dike, MD  Rivaroxaban (XARELTO) 15 MG TABS tablet Take 1 tablet (15 mg total) by mouth daily with supper. 12/05/20   Gerlene Fee, NP  rosuvastatin (CRESTOR) 20 MG tablet Take 1 tablet (20 mg total) by mouth daily at 6 PM. 12/05/20   Nyoka Cowden, Phylis Bougie, NP  sodium bicarbonate 650 MG tablet Take 1 tablet (650 mg total) by mouth 2 (two) times daily. 12/05/20   Gerlene Fee, NP  torsemide (DEMADEX) 20 MG tablet Take 1 tablet (20 mg total) by mouth daily. 12/05/20   Gerlene Fee, NP  vitamin B-12 1000 MCG tablet Take 1 tablet (1,000 mcg total) by mouth daily. Patient not taking: Reported on 04/02/2021 10/12/20   Mckinley Jewel, MD    Allergies    Lactose intolerance (gi)  Review of Systems   Review of Systems  All other systems reviewed and are negative.  Physical Exam Updated Vital Signs BP 107/76   Pulse 90  Temp 99.5 F (37.5 C) (Oral)   Resp 18   Ht 5\' 7"  (1.702 m)   Wt 74.8 kg   SpO2 93%   BMI 25.84 kg/m   Physical Exam Vitals and nursing note reviewed.  Constitutional:      Appearance: She is well-developed.  HENT:     Head: Normocephalic.     Comments: Ecchymosis and ttp to right parietal scalp area    Nose: No congestion or rhinorrhea.  Eyes:     Pupils: Pupils are equal, round, and reactive to light.  Cardiovascular:     Rate and Rhythm: Normal rate and regular rhythm.  Pulmonary:     Effort: No respiratory distress.     Breath sounds: No stridor.  Abdominal:     General: Abdomen is flat. There is no distension.  Musculoskeletal:        General: Tenderness (right anterior  shoulder) and deformity (bilateral LE amputation) present.     Cervical back: Normal range of motion.  Skin:    General: Skin is warm and dry.  Neurological:     General: No focal deficit present.     Mental Status: She is alert.    ED Results / Procedures / Treatments   Labs (all labs ordered are listed, but only abnormal results are displayed) Labs Reviewed  CBC WITH DIFFERENTIAL/PLATELET - Abnormal; Notable for the following components:      Result Value   RBC 2.95 (*)    Hemoglobin 7.8 (*)    HCT 25.9 (*)    RDW 16.9 (*)    All other components within normal limits  BASIC METABOLIC PANEL - Abnormal; Notable for the following components:   Potassium 3.0 (*)    Glucose, Bld 114 (*)    BUN 31 (*)    Creatinine, Ser 2.50 (*)    Calcium 6.5 (*)    GFR, Estimated 22 (*)    All other components within normal limits    EKG None  Radiology CT HEAD WO CONTRAST (5MM)  Result Date: 07/16/2021 CLINICAL DATA:  Head trauma EXAM: CT HEAD WITHOUT CONTRAST TECHNIQUE: Contiguous axial images were obtained from the base of the skull through the vertex without intravenous contrast. COMPARISON:  None. FINDINGS: Brain: No evidence of acute infarction, hemorrhage, hydrocephalus, extra-axial collection or mass lesion/mass effect. Vascular: No hyperdense vessel or unexpected calcification. Skull: Normal. Negative for fracture or focal lesion. Sinuses/Orbits: The visualized paranasal sinuses are essentially clear. The mastoid air cells are unopacified. Other: None. IMPRESSION: Normal head CT. Electronically Signed   By: Julian Hy M.D.   On: 07/16/2021 01:16   CT Shoulder Right Wo Contrast  Result Date: 07/16/2021 CLINICAL DATA:  Fall on right shoulder EXAM: CT OF THE UPPER RIGHT EXTREMITY WITHOUT CONTRAST TECHNIQUE: Multidetector CT imaging of the upper right extremity was performed according to the standard protocol. COMPARISON:  None. FINDINGS: Nondisplaced, mildly comminuted lateral  clavicle fracture (series 4/image 15). Nondisplaced right posteromedial 2nd rib fracture (series 4/image 44). Right proximal humerus is intact. Right scapula is intact. IMPRESSION: Mildly comminuted right lateral clavicle fracture. Nondisplaced right posteromedial 2nd rib fracture. Right proximal humerus is intact. Electronically Signed   By: Julian Hy M.D.   On: 07/16/2021 01:20   DG Shoulder Right Portable  Result Date: 07/15/2021 CLINICAL DATA:  Fall. EXAM: PORTABLE RIGHT SHOULDER COMPARISON:  None. FINDINGS: There is no evidence of fracture or dislocation. Degenerative changes of the acromioclavicular joint. There is no evidence of severe arthropathy or other focal  bone abnormality. Soft tissues are unremarkable. IMPRESSION: No acute displaced fracture or dislocation of the right shoulder with limited evaluation on this two-view radiograph of the shoulder. Electronically Signed   By: Iven Finn M.D.   On: 07/15/2021 22:47    Procedures Procedures   Medications Ordered in ED Medications  lactated ringers bolus 1,000 mL (0 mLs Intravenous Stopped 07/16/21 0245)  fentaNYL (SUBLIMAZE) injection 50 mcg (50 mcg Intravenous Given 07/16/21 0130)    ED Course  I have reviewed the triage vital signs and the nursing notes.  Pertinent labs & imaging results that were available during my care of the patient were reviewed by me and considered in my medical decision making (see chart for details).    MDM Rules/Calculators/A&P                         Clavicle fracture. Rib fracture. AKI. Fluids given. Short course of pain meds prescribed. Ortho follow up suggested for clavicle. PCP for AKI.  Final Clinical Impression(s) / ED Diagnoses Final diagnoses:  Acute renal failure superimposed on chronic kidney disease, unspecified CKD stage, unspecified acute renal failure type (HCC)  Closed fracture of one rib of right side, initial encounter  Closed nondisplaced fracture of acromial end of  right clavicle, initial encounter  Anemia, unspecified type    Rx / DC Orders ED Discharge Orders          Ordered    oxyCODONE-acetaminophen (PERCOCET) 5-325 MG tablet  Every 6 hours PRN        07/16/21 0256             Gemma Ruan, Corene Cornea, MD 07/16/21 [phone]

## 2021-07-17 ENCOUNTER — Encounter: Payer: Self-pay | Admitting: *Deleted

## 2021-07-20 ENCOUNTER — Encounter: Payer: Self-pay | Admitting: Emergency Medicine

## 2021-07-20 ENCOUNTER — Other Ambulatory Visit: Payer: Self-pay

## 2021-07-20 ENCOUNTER — Emergency Department: Payer: Medicare Other

## 2021-07-20 ENCOUNTER — Inpatient Hospital Stay
Admission: EM | Admit: 2021-07-20 | Discharge: 2021-07-30 | DRG: 291 | Disposition: A | Discharge status: Hospice | Payer: Medicare Other | Attending: Internal Medicine | Admitting: Internal Medicine

## 2021-07-20 DIAGNOSIS — Z83438 Family history of other disorder of lipoprotein metabolism and other lipidemia: Secondary | ICD-10-CM

## 2021-07-20 DIAGNOSIS — I4891 Unspecified atrial fibrillation: Secondary | ICD-10-CM | POA: Diagnosis present

## 2021-07-20 DIAGNOSIS — I130 Hypertensive heart and chronic kidney disease with heart failure and stage 1 through stage 4 chronic kidney disease, or unspecified chronic kidney disease: Secondary | ICD-10-CM | POA: Diagnosis present

## 2021-07-20 DIAGNOSIS — K767 Hepatorenal syndrome: Secondary | ICD-10-CM | POA: Diagnosis present

## 2021-07-20 DIAGNOSIS — R578 Other shock: Secondary | ICD-10-CM | POA: Diagnosis not present

## 2021-07-20 DIAGNOSIS — D62 Acute posthemorrhagic anemia: Secondary | ICD-10-CM | POA: Diagnosis not present

## 2021-07-20 DIAGNOSIS — Z515 Encounter for palliative care: Secondary | ICD-10-CM | POA: Diagnosis not present

## 2021-07-20 DIAGNOSIS — Z66 Do not resuscitate: Secondary | ICD-10-CM | POA: Diagnosis present

## 2021-07-20 DIAGNOSIS — K920 Hematemesis: Secondary | ICD-10-CM | POA: Diagnosis present

## 2021-07-20 DIAGNOSIS — J810 Acute pulmonary edema: Secondary | ICD-10-CM

## 2021-07-20 DIAGNOSIS — Z8249 Family history of ischemic heart disease and other diseases of the circulatory system: Secondary | ICD-10-CM

## 2021-07-20 DIAGNOSIS — E785 Hyperlipidemia, unspecified: Secondary | ICD-10-CM | POA: Diagnosis present

## 2021-07-20 DIAGNOSIS — E1122 Type 2 diabetes mellitus with diabetic chronic kidney disease: Secondary | ICD-10-CM | POA: Diagnosis present

## 2021-07-20 DIAGNOSIS — Z20822 Contact with and (suspected) exposure to covid-19: Secondary | ICD-10-CM | POA: Diagnosis present

## 2021-07-20 DIAGNOSIS — F32A Depression, unspecified: Secondary | ICD-10-CM | POA: Diagnosis present

## 2021-07-20 DIAGNOSIS — K7460 Unspecified cirrhosis of liver: Secondary | ICD-10-CM | POA: Diagnosis present

## 2021-07-20 DIAGNOSIS — E871 Hypo-osmolality and hyponatremia: Secondary | ICD-10-CM | POA: Diagnosis not present

## 2021-07-20 DIAGNOSIS — I10 Essential (primary) hypertension: Secondary | ICD-10-CM | POA: Diagnosis not present

## 2021-07-20 DIAGNOSIS — R778 Other specified abnormalities of plasma proteins: Secondary | ICD-10-CM | POA: Diagnosis not present

## 2021-07-20 DIAGNOSIS — Z7901 Long term (current) use of anticoagulants: Secondary | ICD-10-CM

## 2021-07-20 DIAGNOSIS — E860 Dehydration: Secondary | ICD-10-CM | POA: Diagnosis present

## 2021-07-20 DIAGNOSIS — N184 Chronic kidney disease, stage 4 (severe): Secondary | ICD-10-CM | POA: Diagnosis present

## 2021-07-20 DIAGNOSIS — J9601 Acute respiratory failure with hypoxia: Secondary | ICD-10-CM | POA: Diagnosis present

## 2021-07-20 DIAGNOSIS — Z89511 Acquired absence of right leg below knee: Secondary | ICD-10-CM

## 2021-07-20 DIAGNOSIS — N170 Acute kidney failure with tubular necrosis: Secondary | ICD-10-CM | POA: Diagnosis present

## 2021-07-20 DIAGNOSIS — I214 Non-ST elevation (NSTEMI) myocardial infarction: Secondary | ICD-10-CM | POA: Diagnosis not present

## 2021-07-20 DIAGNOSIS — N179 Acute kidney failure, unspecified: Secondary | ICD-10-CM | POA: Diagnosis not present

## 2021-07-20 DIAGNOSIS — I255 Ischemic cardiomyopathy: Secondary | ICD-10-CM | POA: Diagnosis present

## 2021-07-20 DIAGNOSIS — I5041 Acute combined systolic (congestive) and diastolic (congestive) heart failure: Secondary | ICD-10-CM | POA: Diagnosis not present

## 2021-07-20 DIAGNOSIS — E1151 Type 2 diabetes mellitus with diabetic peripheral angiopathy without gangrene: Secondary | ICD-10-CM | POA: Diagnosis present

## 2021-07-20 DIAGNOSIS — J440 Chronic obstructive pulmonary disease with acute lower respiratory infection: Secondary | ICD-10-CM | POA: Diagnosis present

## 2021-07-20 DIAGNOSIS — Z89612 Acquired absence of left leg above knee: Secondary | ICD-10-CM

## 2021-07-20 DIAGNOSIS — R339 Retention of urine, unspecified: Secondary | ICD-10-CM | POA: Diagnosis present

## 2021-07-20 DIAGNOSIS — K922 Gastrointestinal hemorrhage, unspecified: Secondary | ICD-10-CM | POA: Diagnosis not present

## 2021-07-20 DIAGNOSIS — E739 Lactose intolerance, unspecified: Secondary | ICD-10-CM | POA: Diagnosis present

## 2021-07-20 DIAGNOSIS — Z79899 Other long term (current) drug therapy: Secondary | ICD-10-CM

## 2021-07-20 DIAGNOSIS — W052XXD Fall from non-moving motorized mobility scooter, subsequent encounter: Secondary | ICD-10-CM | POA: Diagnosis present

## 2021-07-20 DIAGNOSIS — Z6832 Body mass index (BMI) 32.0-32.9, adult: Secondary | ICD-10-CM

## 2021-07-20 DIAGNOSIS — R7989 Other specified abnormal findings of blood chemistry: Secondary | ICD-10-CM

## 2021-07-20 DIAGNOSIS — Z7189 Other specified counseling: Secondary | ICD-10-CM | POA: Diagnosis not present

## 2021-07-20 DIAGNOSIS — I2510 Atherosclerotic heart disease of native coronary artery without angina pectoris: Secondary | ICD-10-CM | POA: Diagnosis present

## 2021-07-20 DIAGNOSIS — Z8616 Personal history of COVID-19: Secondary | ICD-10-CM

## 2021-07-20 DIAGNOSIS — I739 Peripheral vascular disease, unspecified: Secondary | ICD-10-CM | POA: Diagnosis not present

## 2021-07-20 DIAGNOSIS — I482 Chronic atrial fibrillation, unspecified: Secondary | ICD-10-CM | POA: Diagnosis present

## 2021-07-20 DIAGNOSIS — E1142 Type 2 diabetes mellitus with diabetic polyneuropathy: Secondary | ICD-10-CM | POA: Diagnosis present

## 2021-07-20 DIAGNOSIS — I5023 Acute on chronic systolic (congestive) heart failure: Secondary | ICD-10-CM | POA: Diagnosis present

## 2021-07-20 DIAGNOSIS — Z833 Family history of diabetes mellitus: Secondary | ICD-10-CM

## 2021-07-20 DIAGNOSIS — K7290 Hepatic failure, unspecified without coma: Secondary | ICD-10-CM | POA: Diagnosis present

## 2021-07-20 DIAGNOSIS — I252 Old myocardial infarction: Secondary | ICD-10-CM

## 2021-07-20 DIAGNOSIS — D631 Anemia in chronic kidney disease: Secondary | ICD-10-CM | POA: Diagnosis present

## 2021-07-20 DIAGNOSIS — Z01810 Encounter for preprocedural cardiovascular examination: Secondary | ICD-10-CM | POA: Diagnosis not present

## 2021-07-20 DIAGNOSIS — L8990 Pressure ulcer of unspecified site, unspecified stage: Secondary | ICD-10-CM | POA: Insufficient documentation

## 2021-07-20 DIAGNOSIS — K2100 Gastro-esophageal reflux disease with esophagitis, without bleeding: Secondary | ICD-10-CM | POA: Diagnosis present

## 2021-07-20 DIAGNOSIS — S2231XD Fracture of one rib, right side, subsequent encounter for fracture with routine healing: Secondary | ICD-10-CM

## 2021-07-20 DIAGNOSIS — Z794 Long term (current) use of insulin: Secondary | ICD-10-CM

## 2021-07-20 DIAGNOSIS — R630 Anorexia: Secondary | ICD-10-CM | POA: Diagnosis present

## 2021-07-20 DIAGNOSIS — F17210 Nicotine dependence, cigarettes, uncomplicated: Secondary | ICD-10-CM | POA: Diagnosis present

## 2021-07-20 DIAGNOSIS — E8881 Metabolic syndrome: Secondary | ICD-10-CM | POA: Diagnosis present

## 2021-07-20 LAB — BASIC METABOLIC PANEL
Anion gap: 11 (ref 5–15)
BUN: 47 mg/dL — ABNORMAL HIGH (ref 6–20)
CO2: 24 mmol/L (ref 22–32)
Calcium: 7.5 mg/dL — ABNORMAL LOW (ref 8.9–10.3)
Chloride: 103 mmol/L (ref 98–111)
Creatinine, Ser: 3.55 mg/dL — ABNORMAL HIGH (ref 0.44–1.00)
GFR, Estimated: 14 mL/min — ABNORMAL LOW (ref >60)
Glucose, Bld: 112 mg/dL — ABNORMAL HIGH (ref 70–99)
Potassium: 4.3 mmol/L (ref 3.5–5.1)
Sodium: 138 mmol/L (ref 135–145)

## 2021-07-20 LAB — HEPARIN LEVEL (UNFRACTIONATED): Heparin Unfractionated: <0.1 IU/mL — ABNORMAL LOW (ref 0.30–0.70)

## 2021-07-20 LAB — PROTIME-INR
INR: 1.4 — ABNORMAL HIGH (ref 0.8–1.2)
Prothrombin Time: 17 seconds — ABNORMAL HIGH (ref 11.4–15.2)

## 2021-07-20 LAB — BRAIN NATRIURETIC PEPTIDE: B Natriuretic Peptide: 3014.7 pg/mL — ABNORMAL HIGH (ref 0.0–100.0)

## 2021-07-20 LAB — CBC
HCT: 25 % — ABNORMAL LOW (ref 36.0–46.0)
Hemoglobin: 7.8 g/dL — ABNORMAL LOW (ref 12.0–15.0)
MCH: 27.1 pg (ref 26.0–34.0)
MCHC: 31.2 g/dL (ref 30.0–36.0)
MCV: 86.8 fL (ref 80.0–100.0)
Platelets: 209 10*3/uL (ref 150–400)
RBC: 2.88 MIL/uL — ABNORMAL LOW (ref 3.87–5.11)
RDW: 17 % — ABNORMAL HIGH (ref 11.5–15.5)
WBC: 7.1 10*3/uL (ref 4.0–10.5)
nRBC: 0 % (ref 0.0–0.2)

## 2021-07-20 LAB — APTT: aPTT: 31 seconds (ref 24–36)

## 2021-07-20 LAB — RESP PANEL BY RT-PCR (FLU A&B, COVID) ARPGX2
Influenza A by PCR: NEGATIVE
Influenza B by PCR: NEGATIVE
SARS Coronavirus 2 by RT PCR: NEGATIVE

## 2021-07-20 LAB — TROPONIN I (HIGH SENSITIVITY)
Troponin I (High Sensitivity): 445 ng/L (ref <18)
Troponin I (High Sensitivity): 456 ng/L (ref <18)

## 2021-07-20 MED ORDER — ONDANSETRON HCL 4 MG/2ML IJ SOLN
4.0000 mg | Freq: Once | INTRAMUSCULAR | Status: AC
  Administered 2021-07-20: 4 mg via INTRAVENOUS
  Filled 2021-07-20: qty 2

## 2021-07-20 MED ORDER — INSULIN GLARGINE-YFGN 100 UNIT/ML ~~LOC~~ SOLN
10.0000 [IU] | Freq: Every day | SUBCUTANEOUS | Status: DC
  Filled 2021-07-20 (×3): qty 0.1

## 2021-07-20 MED ORDER — NITROGLYCERIN 0.4 MG SL SUBL: 0.4000 mg | SUBLINGUAL_TABLET | SUBLINGUAL | Status: DC | PRN

## 2021-07-20 MED ORDER — HEPARIN (PORCINE) 25000 UT/250ML-% IV SOLN
1100.0000 [IU]/h | INTRAVENOUS | Status: DC
  Administered 2021-07-20: 900 [IU]/h via INTRAVENOUS
  Filled 2021-07-20: qty 250

## 2021-07-20 MED ORDER — FUROSEMIDE 10 MG/ML IJ SOLN
40.0000 mg | Freq: Two times a day (BID) | INTRAMUSCULAR | Status: DC
  Administered 2021-07-21: 40 mg via INTRAVENOUS
  Filled 2021-07-20: qty 4

## 2021-07-20 MED ORDER — HEPARIN SODIUM (PORCINE) 5000 UNIT/ML IJ SOLN
4000.0000 [IU] | Freq: Once | INTRAMUSCULAR | Status: AC
  Administered 2021-07-20: 4000 [IU] via INTRAVENOUS
  Filled 2021-07-20: qty 1

## 2021-07-20 MED ORDER — RIVAROXABAN 15 MG PO TABS: 15.0000 mg | ORAL_TABLET | Freq: Every day | ORAL | Status: DC

## 2021-07-20 MED ORDER — ASPIRIN 81 MG PO CHEW
324.0000 mg | CHEWABLE_TABLET | Freq: Once | ORAL | Status: AC
  Administered 2021-07-20: 324 mg via ORAL
  Filled 2021-07-20: qty 4

## 2021-07-20 MED ORDER — ACETAMINOPHEN 325 MG PO TABS
650.0000 mg | ORAL_TABLET | ORAL | Status: DC | PRN
  Administered 2021-07-21 – 2021-07-28 (×7): 650 mg via ORAL
  Filled 2021-07-20 (×7): qty 2

## 2021-07-20 MED ORDER — METOPROLOL TARTRATE 25 MG PO TABS: 12.5000 mg | ORAL_TABLET | Freq: Two times a day (BID) | ORAL | Status: DC

## 2021-07-20 MED ORDER — INSULIN ASPART 100 UNIT/ML IJ SOLN: 2.0000 [IU] | Freq: Three times a day (TID) | INTRAMUSCULAR | Status: DC

## 2021-07-20 MED ORDER — ONDANSETRON HCL 4 MG/2ML IJ SOLN
4.0000 mg | Freq: Four times a day (QID) | INTRAMUSCULAR | Status: DC | PRN
  Administered 2021-07-22 – 2021-07-30 (×8): 4 mg via INTRAVENOUS
  Filled 2021-07-20 (×8): qty 2

## 2021-07-20 MED ORDER — FENTANYL CITRATE PF 50 MCG/ML IJ SOSY
25.0000 ug | PREFILLED_SYRINGE | Freq: Once | INTRAMUSCULAR | Status: AC
  Administered 2021-07-20: 25 ug via INTRAVENOUS
  Filled 2021-07-20: qty 1

## 2021-07-20 MED ORDER — FUROSEMIDE 10 MG/ML IJ SOLN
40.0000 mg | Freq: Once | INTRAMUSCULAR | Status: AC
  Administered 2021-07-20: 40 mg via INTRAVENOUS
  Filled 2021-07-20: qty 4

## 2021-07-20 MED ORDER — TIOTROPIUM BROMIDE MONOHYDRATE 18 MCG IN CAPS
1.0000 | ORAL_CAPSULE | Freq: Every day | RESPIRATORY_TRACT | Status: DC
  Administered 2021-07-21 – 2021-07-30 (×8): 18 ug via RESPIRATORY_TRACT
  Filled 2021-07-20 (×3): qty 5

## 2021-07-20 MED ORDER — SODIUM BICARBONATE 650 MG PO TABS
650.0000 mg | ORAL_TABLET | Freq: Two times a day (BID) | ORAL | Status: DC
  Administered 2021-07-21 – 2021-07-22 (×4): 650 mg via ORAL
  Filled 2021-07-20 (×6): qty 1

## 2021-07-20 MED ORDER — PANTOPRAZOLE SODIUM 40 MG PO TBEC
80.0000 mg | DELAYED_RELEASE_TABLET | Freq: Every day | ORAL | Status: DC
  Administered 2021-07-21 – 2021-07-22 (×2): 80 mg via ORAL
  Filled 2021-07-20 (×2): qty 2

## 2021-07-20 MED ORDER — PANCRELIPASE (LIP-PROT-AMYL) 12000-38000 UNITS PO CPEP
36000.0000 [IU] | ORAL_CAPSULE | Freq: Three times a day (TID) | ORAL | Status: DC
  Administered 2021-07-21 – 2021-07-22 (×5): 36000 [IU] via ORAL
  Filled 2021-07-20 (×8): qty 1

## 2021-07-20 MED ORDER — ROSUVASTATIN CALCIUM 10 MG PO TABS
20.0000 mg | ORAL_TABLET | Freq: Every day | ORAL | Status: DC
  Administered 2021-07-21 – 2021-07-22 (×2): 20 mg via ORAL
  Filled 2021-07-20: qty 1
  Filled 2021-07-20 (×2): qty 2

## 2021-07-20 MED ORDER — INSULIN ASPART 100 UNIT/ML IJ SOLN: 0.0000 [IU] | Freq: Three times a day (TID) | INTRAMUSCULAR | Status: DC
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
## 2021-07-20 NOTE — ED Notes (Signed)
Assisted pt with set up of meal tray.
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
## 2021-07-20 NOTE — Consult Note (Signed)
ANTICOAGULATION CONSULT NOTE - Initial Consult  Pharmacy Consult for Hepatin gtt Indication: chest pain/ACS  Allergies  Allergen Reactions   Lactose Intolerance (Gi) Diarrhea    Patient Measurements: Height: 5\' 7"  (170.2 cm) Weight: 74.8 kg (165 lb) IBW/kg (Calculated) : 61.6 Heparin Dosing Weight: 74.8kg  Vital Signs: Temp: 98.9 F (37.2 C) (09/02 1554) Temp Source: Oral (09/02 1554) BP: 104/55 (09/02 1554) Pulse Rate: 77 (09/02 1554)  Labs: Recent Labs    07/20/21 1604  HGB 7.8*  HCT 25.0*  PLT 209  CREATININE 3.55*  TROPONINIHS 445*    Estimated Creatinine Clearance: 18.5 mL/min (A) (by C-G formula based on SCr of 3.55 mg/dL (H)).   Medical History: Past Medical History:  Diagnosis Date   Abnormal stress test    a. 02/2017 MV: large region of fixed perfusion defect in basal to mid inf, mid-dist inflat walls, EF 43%. No ischemia (EF 55-60% by f/u echo).   Arthritis    Asthma    Carotid arterial disease ([place])    a. 09/2017 Carotid U/S: 40-49% bilat ICA stenosis.   Chronic back pain    Coronary artery calcification seen on CT scan    a. 11/2017 CT Abd/Pelvis: Multi vessel coronary vascular Ca2+.   Depression    Diabetes mellitus    Diabetic neuropathy ([place])    Difficult intubation    DIFFICULT AIRWAY/FYI   Family history of adverse reaction to anesthesia    mother had difficlty waking    Femoral-popliteal bypass graft occlusion, left ([place]) 12/02/2017   GERD (gastroesophageal reflux disease)    History of echocardiogram    a. 03/2017 Echo: EF 55-60%, mild LVH, nl RV fxn.   Hyperlipidemia    Ischemic cardiomyopathy    a. 04/2018 Echo: EF 30-35%, Gr2 DD, mild LVH. Mild MR. Mildly dil LA, mod dil RV w/ mod red RV fxn, mild TR, PASP 40mmHg.   NSTEMI (non-ST elevated myocardial infarction) ([place])    a. 05/2018 in setting of Afib, sepsis, and post-op L AKA. Peak trop 7.7. EF 30-35% by echo-->cath not performed 2/2 renal failure.   Osteomyelitis of right fibula ([place])  03/05/2017   PAD (peripheral artery disease) ([place])    a. S/p L fem-pop bypass; b. 11/2017 s/p Aortobifem bypass 2/2 graft occlusion; c. 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   Paroxysmal atrial fibrillation with rapid ventricular response ([place]) 12/02/2017   a. CHA2DS2VASc = 3-->Xarelto; b. 05/2018 Recurrent Afib-->amio.   Renal insufficiency    Ulcer    Foot    Medications:  Scheduled:   fentaNYL (SUBLIMAZE) injection  25 mcg Intravenous Once   heparin  4,000 Units Intravenous Once    Assessment:  Patient presents with SOB. PMH includes NSTEMI, DM, PAD, and renal insufficiency. Troponin at 445 and BNP 3014. Pharmacy was consulted to initiate heparin drip for ACS. Noted patient was on Xarelto prior to admission. Baseline aPTT, INR and heparin level ordered d/t PTA Xarelto.  Goal of Therapy:  Heparin level 0.3-0.7 units/ml Monitor platelets by anticoagulation protocol: Yes   Plan:  Give 4000 units bolus x 1 Start heparin infusion at 900 units/hr Next aPTT and heparin level in 6 hours  Maciah Schweigert Rodriguez-Guzman PharmD, BCPS 07/20/2021 6:00 PM

## 2021-07-21 ENCOUNTER — Inpatient Hospital Stay: Payer: Medicare Other

## 2021-07-21 ENCOUNTER — Encounter: Payer: Self-pay | Admitting: Family Medicine

## 2021-07-21 DIAGNOSIS — S2231XD Fracture of one rib, right side, subsequent encounter for fracture with routine healing: Secondary | ICD-10-CM

## 2021-07-21 DIAGNOSIS — N179 Acute kidney failure, unspecified: Secondary | ICD-10-CM | POA: Diagnosis not present

## 2021-07-21 DIAGNOSIS — R778 Other specified abnormalities of plasma proteins: Secondary | ICD-10-CM | POA: Diagnosis not present

## 2021-07-21 LAB — CBC
HCT: 25.4 % — ABNORMAL LOW (ref 36.0–46.0)
Hemoglobin: 7.6 g/dL — ABNORMAL LOW (ref 12.0–15.0)
MCH: 26.1 pg (ref 26.0–34.0)
MCHC: 29.9 g/dL — ABNORMAL LOW (ref 30.0–36.0)
MCV: 87.3 fL (ref 80.0–100.0)
Platelets: 202 10*3/uL (ref 150–400)
RBC: 2.91 MIL/uL — ABNORMAL LOW (ref 3.87–5.11)
RDW: 17.2 % — ABNORMAL HIGH (ref 11.5–15.5)
WBC: 5.7 10*3/uL (ref 4.0–10.5)
nRBC: 0 % (ref 0.0–0.2)

## 2021-07-21 LAB — APTT
aPTT: 80 seconds — ABNORMAL HIGH (ref 24–36)
aPTT: 39 seconds — ABNORMAL HIGH (ref 24–36)

## 2021-07-21 LAB — BASIC METABOLIC PANEL
Anion gap: 10 (ref 5–15)
BUN: 47 mg/dL — ABNORMAL HIGH (ref 6–20)
CO2: 24 mmol/L (ref 22–32)
Calcium: 7.2 mg/dL — ABNORMAL LOW (ref 8.9–10.3)
Chloride: 104 mmol/L (ref 98–111)
Creatinine, Ser: 3.61 mg/dL — ABNORMAL HIGH (ref 0.44–1.00)
GFR, Estimated: 14 mL/min — ABNORMAL LOW (ref >60)
Glucose, Bld: 82 mg/dL (ref 70–99)
Potassium: 4.5 mmol/L (ref 3.5–5.1)
Sodium: 138 mmol/L (ref 135–145)

## 2021-07-21 LAB — HEMOGLOBIN A1C
Hgb A1c MFr Bld: 5.7 % — ABNORMAL HIGH (ref 4.8–5.6)
Mean Plasma Glucose: 116.89 mg/dL

## 2021-07-21 LAB — CBG MONITORING, ED
Glucose-Capillary: 75 mg/dL (ref 70–99)
Glucose-Capillary: 73 mg/dL (ref 70–99)

## 2021-07-21 LAB — HEPARIN LEVEL (UNFRACTIONATED)
Heparin Unfractionated: 0.12 IU/mL — ABNORMAL LOW (ref 0.30–0.70)
Heparin Unfractionated: <0.1 IU/mL — ABNORMAL LOW (ref 0.30–0.70)

## 2021-07-21 LAB — TROPONIN I (HIGH SENSITIVITY): Troponin I (High Sensitivity): 365 ng/L (ref <18)

## 2021-07-21 LAB — GLUCOSE, CAPILLARY
Glucose-Capillary: 104 mg/dL — ABNORMAL HIGH (ref 70–99)
Glucose-Capillary: 87 mg/dL (ref 70–99)

## 2021-07-21 MED ORDER — OXYCODONE-ACETAMINOPHEN 5-325 MG PO TABS
1.0000 | ORAL_TABLET | Freq: Four times a day (QID) | ORAL | Status: DC | PRN
Start: 2021-07-21 — End: 2021-07-23
  Administered 2021-07-21 – 2021-07-22 (×3): 1 via ORAL
  Filled 2021-07-21 (×3): qty 1

## 2021-07-21 MED ORDER — SODIUM CHLORIDE 0.9 % IV SOLN: INTRAVENOUS | Status: DC

## 2021-07-21 MED ORDER — PREGABALIN 25 MG PO CAPS: 100.0000 mg | ORAL_CAPSULE | Freq: Two times a day (BID) | ORAL | Status: DC

## 2021-07-21 MED ORDER — SODIUM CHLORIDE 0.9 % IV BOLUS
500.0000 mL | Freq: Once | INTRAVENOUS | Status: AC
  Administered 2021-07-21: 500 mL via INTRAVENOUS

## 2021-07-21 MED ORDER — PREGABALIN 50 MG PO CAPS
100.0000 mg | ORAL_CAPSULE | Freq: Every day | ORAL | Status: DC
  Administered 2021-07-21 – 2021-07-22 (×2): 100 mg via ORAL
  Filled 2021-07-21: qty 4
  Filled 2021-07-21: qty 2

## 2021-07-21 MED ORDER — HEPARIN BOLUS VIA INFUSION
2500.0000 [IU] | Freq: Once | INTRAVENOUS | Status: AC
  Administered 2021-07-21: 2500 [IU] via INTRAVENOUS
  Filled 2021-07-21: qty 2500

## 2021-07-21 MED ORDER — PREGABALIN 25 MG PO CAPS: 100.0000 mg | ORAL_CAPSULE | Freq: Every day | ORAL | Status: DC

## 2021-07-21 NOTE — Progress Notes (Signed)
Report received from ED RN [facility]. This RN signed off to receive patient. MEWS Yellow in ED d/t hypotension. Vitals will be checked on admission to floor as well as a CBG checked.

## 2021-07-21 NOTE — ED Notes (Signed)
Pt had large BM. Purewick and brief changed. Pt repositioned in bed. Encouraged independence in pt through rolling.
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
## 2021-07-21 NOTE — ED Notes (Signed)
Pt started yelling from room. Pt reminded of use of the call light. Pt states that she is blind, pt reminded call light is by her left hand, as it has remained throughout stay. Pt states she needs the lights on in the room. Pt stated she needed the channel on the television changed. Pt reminded she had remote in right hand. Pt directed how to change channels. Watched pt change channels independently.

## 2021-07-21 NOTE — ED Notes (Signed)
Pt moved to hospital bed.

## 2021-07-21 NOTE — ED Notes (Signed)
Pt rang out stating that she needed something to drink. Pt had 2 waters, 2 teas and a milk. Pt stated "I don't want anything that tastes like water". Pt agreeable to splenda in iced tea.

## 2021-07-21 NOTE — ED Notes (Signed)
Pt became upset that I gave her water with her PO tylenol. Pt states that she only drinks soda. Pt educated about the importance of drinking water and avoiding sugary drinks.

## 2021-07-21 NOTE — ED Notes (Addendum)
Pt states that her husband is a "jerk" and does not check sugars regularly on her. Asked pt why she does not do it herself. Pt states she is blind and cannot. Pt told to ask her endocrinologist about getting one that speaks to her so she can do it independently

## 2021-07-21 NOTE — ED Notes (Signed)
Prior to entering pt's room, pt had removed nasal cannula and EKG leads. Put canula back in place and educated pt on importance of keeping it in .

## 2021-07-21 NOTE — Consult Note (Addendum)
[place] for Hepatin gtt Indication: chest pain/ACS  Allergies  Allergen Reactions   Lactose Intolerance (Gi) Diarrhea    Patient Measurements: Height: 5\' 7"  (170.2 cm) Weight: 74.8 kg (165 lb) IBW/kg (Calculated) : 61.6 Heparin Dosing Weight: 74.8kg  Vital Signs: BP: 89/53 (09/03 1030) Pulse Rate: 73 (09/03 1030)  Labs: Recent Labs    07/20/21 1604 07/20/21 1810 07/20/21 1814 07/21/21 0100 07/21/21 0500 07/21/21 0925  HGB 7.8*  --   --   --  7.6*  --   HCT 25.0*  --   --   --  25.4*  --   PLT 209  --   --   --  202  --   APTT  --   --  31 80*  --  39*  LABPROT  --   --  17.0*  --   --   --   INR  --   --  1.4*  --   --   --   HEPARINUNFRC  --  <0.10*  --  0.12*  --  <0.10*  CREATININE 3.55*  --   --   --  3.61*  --   TROPONINIHS 445* 456*  --   --  365*  --      Estimated Creatinine Clearance: 18.2 mL/min (A) (by C-G formula based on SCr of 3.61 mg/dL (H)).   Medical History: Past Medical History:  Diagnosis Date   Abnormal stress test    a. 02/2017 MV: large region of fixed perfusion defect in basal to mid inf, mid-dist inflat walls, EF 43%. No ischemia (EF 55-60% by f/u echo).   Arthritis    Asthma    Carotid arterial disease ([place])    a. 09/2017 Carotid U/S: 40-49% bilat ICA stenosis.   Chronic back pain    Coronary artery calcification seen on CT scan    a. 11/2017 CT Abd/Pelvis: Multi vessel coronary vascular Ca2+.   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 03/2017 Echo: EF 55-60%, mild LVH, nl RV fxn.   Hyperlipidemia    Ischemic cardiomyopathy    a. 04/2018 Echo: EF 30-35%, Gr2 DD, mild LVH. Mild MR. Mildly dil LA, mod  dil RV w/ mod red RV fxn, mild TR, PASP 37mmHg.   NSTEMI (non-ST elevated myocardial infarction) ([place])    a. 05/2018 in setting of Afib, sepsis, and post-op L AKA. Peak trop 7.7. EF 30-35% by echo-->cath not performed 2/2 renal failure.   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 S/p L fem-pop bypass; b. 11/2017 s/p Aortobifem bypass 2/2 graft occlusion; c. 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   Paroxysmal atrial fibrillation with rapid ventricular response ([place]) 12/02/2017   a. CHA2DS2VASc = 3-->Xarelto; b. 05/2018 Recurrent Afib-->amio.   Renal insufficiency    Ulcer    Foot    Medications:  Scheduled:   furosemide  40 mg Intravenous BID   insulin aspart  0-6 Units Subcutaneous TID WC   insulin aspart  2 Units Subcutaneous TID WC   insulin glargine-yfgn  10 Units  Subcutaneous QHS   lipase/protease/amylase  36,000 Units Oral TID AC   metoprolol tartrate  12.5 mg Oral BID   pantoprazole  80 mg Oral Daily   pregabalin  100 mg Oral BID   rosuvastatin  20 mg Oral q1800   sodium bicarbonate  650 mg Oral BID   tiotropium  1 capsule Inhalation Daily    Assessment:  Patient presents with SOB. PMH includes NSTEMI, DM, PAD, and renal insufficiency. Troponin at 445 and BNP 3014. Pharmacy was consulted to initiate heparin drip for ACS. Noted patient was on Xarelto prior to admission. Baseline aPTT, INR and heparin level ordered d/t PTA Xarelto.  Baseline Heparin level was not elevated.   9/3 0100 aPTT 80, heparin level 0.12.  9/3 0925 aPTT 39, HL < 0.1  Goal of Therapy:  Heparin level 0.3-0.7 units/ml Monitor platelets by anticoagulation protocol: Yes   Plan:  Heparin level is subtherapeutic and was not falsely elevated due to DOAC PTA. Will use heparin to monitor heparin infusion. Will give a heparin bolus of 2500 units x 1 and increase heparin infusion to 1100 units/hr. Recheck heparin level in 8 hours  CBC daily while on heparin.    Eleonore Chiquito, PharmD, BCPS 07/21/2021 10:53 AM

## 2021-07-21 NOTE — ED Notes (Signed)
Pt assisted self and this RN sliding up in the bed.

## 2021-07-21 NOTE — ED Notes (Signed)
Pt repositioned and is laying on left side with pillows under right side; pt verbalizes she is comfortable at this time

## 2021-07-21 NOTE — ED Notes (Signed)
Pt states she is uncomfortable in ER stretcher. Contacted Network engineer to order hospital bed.

## 2021-07-21 NOTE — Consult Note (Signed)
[place] for Hepatin gtt Indication: chest pain/ACS  Allergies  Allergen Reactions   Lactose Intolerance (Gi) Diarrhea    Patient Measurements: Height: 5\' 7"  (170.2 cm) Weight: 74.8 kg (165 lb) IBW/kg (Calculated) : 61.6 Heparin Dosing Weight: 74.8kg  Vital Signs: BP: 97/51 (09/03 0730) Pulse Rate: 75 (09/03 0730)  Labs: Recent Labs    07/20/21 1604 07/20/21 1810 07/20/21 1814 07/21/21 0100 07/21/21 0500  HGB 7.8*  --   --   --  7.6*  HCT 25.0*  --   --   --  25.4*  PLT 209  --   --   --  202  APTT  --   --  31 80*  --   LABPROT  --   --  17.0*  --   --   INR  --   --  1.4*  --   --   HEPARINUNFRC  --  <0.10*  --  0.12*  --   CREATININE 3.55*  --   --   --  3.61*  TROPONINIHS 445* 456*  --   --  365*     Estimated Creatinine Clearance: 18.2 mL/min (A) (by C-G formula based on SCr of 3.61 mg/dL (H)).   Medical History: Past Medical History:  Diagnosis Date   Abnormal stress test    a. 02/2017 MV: large region of fixed perfusion defect in basal to mid inf, mid-dist inflat walls, EF 43%. No ischemia (EF 55-60% by f/u echo).   Arthritis    Asthma    Carotid arterial disease ([place])    a. 09/2017 Carotid U/S: 40-49% bilat ICA stenosis.   Chronic back pain    Coronary artery calcification seen on CT scan    a. 11/2017 CT Abd/Pelvis: Multi vessel coronary vascular Ca2+.   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 03/2017 Echo: EF 55-60%, mild LVH, nl RV fxn.   Hyperlipidemia    Ischemic cardiomyopathy    a. 04/2018 Echo: EF 30-35%, Gr2 DD, mild LVH. Mild MR. Mildly dil LA, mod dil RV w/ mod red RV fxn, mild TR, PASP 11mmHg.   NSTEMI  (non-ST elevated myocardial infarction) ([place])    a. 05/2018 in setting of Afib, sepsis, and post-op L AKA. Peak trop 7.7. EF 30-35% by echo-->cath not performed 2/2 renal failure.   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 S/p L fem-pop bypass; b. 11/2017 s/p Aortobifem bypass 2/2 graft occlusion; c. 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   Paroxysmal atrial fibrillation with rapid ventricular response ([place]) 12/02/2017   a. CHA2DS2VASc = 3-->Xarelto; b. 05/2018 Recurrent Afib-->amio.   Renal insufficiency    Ulcer    Foot    Medications:  Scheduled:   furosemide  40 mg Intravenous BID   insulin aspart  0-6 Units Subcutaneous TID WC   insulin aspart  2 Units Subcutaneous TID WC   insulin glargine-yfgn  10 Units Subcutaneous QHS   lipase/protease/amylase  36,000 Units Oral TID AC   metoprolol tartrate  12.5 mg Oral BID   pantoprazole  80  mg Oral Daily   rosuvastatin  20 mg Oral q1800   sodium bicarbonate  650 mg Oral BID   tiotropium  1 capsule Inhalation Daily    Assessment:  Patient presents with SOB. PMH includes NSTEMI, DM, PAD, and renal insufficiency. Troponin at 445 and BNP 3014. Pharmacy was consulted to initiate heparin drip for ACS. Noted patient was on Xarelto prior to admission. Baseline aPTT, INR and heparin level ordered d/t PTA Xarelto.  Baseline Heparin level was not elevated.   9/3 0100 aPTT 80, heparin level 0.12.   Goal of Therapy:  Heparin level 0.3-0.7 units/ml Monitor platelets by anticoagulation protocol: Yes   Plan:  aPTT is therapeutic, but heparin level is subtherapeutic. Will order aPTT and heparin level 8 hours from last labs. Will continue current heparin rate at 900 units/hr. Recommend monitor with heparin level.   Eleonore Chiquito, PharmD, BCPS 07/21/2021 7:59 AM

## 2021-07-21 NOTE — ED Notes (Signed)
Informed RN bed assigned 

## 2021-07-22 ENCOUNTER — Inpatient Hospital Stay (HOSPITAL_COMMUNITY)
Admit: 2021-07-22 | Discharge: 2021-07-22 | Disposition: A | Discharge status: Home / Self Care | Payer: Medicare Other | Attending: Family Medicine | Admitting: Family Medicine

## 2021-07-22 DIAGNOSIS — S2231XD Fracture of one rib, right side, subsequent encounter for fracture with routine healing: Secondary | ICD-10-CM | POA: Diagnosis not present

## 2021-07-22 DIAGNOSIS — L8990 Pressure ulcer of unspecified site, unspecified stage: Secondary | ICD-10-CM | POA: Insufficient documentation

## 2021-07-22 DIAGNOSIS — I214 Non-ST elevation (NSTEMI) myocardial infarction: Secondary | ICD-10-CM | POA: Diagnosis not present

## 2021-07-22 DIAGNOSIS — R778 Other specified abnormalities of plasma proteins: Secondary | ICD-10-CM | POA: Diagnosis not present

## 2021-07-22 DIAGNOSIS — N179 Acute kidney failure, unspecified: Secondary | ICD-10-CM | POA: Diagnosis not present

## 2021-07-22 LAB — COMPREHENSIVE METABOLIC PANEL
ALT: 8 U/L (ref 0–44)
AST: 10 U/L — ABNORMAL LOW (ref 15–41)
Albumin: 2.7 g/dL — ABNORMAL LOW (ref 3.5–5.0)
Alkaline Phosphatase: 80 U/L (ref 38–126)
Anion gap: 9 (ref 5–15)
BUN: 57 mg/dL — ABNORMAL HIGH (ref 6–20)
CO2: 23 mmol/L (ref 22–32)
Calcium: 7.2 mg/dL — ABNORMAL LOW (ref 8.9–10.3)
Chloride: 103 mmol/L (ref 98–111)
Creatinine, Ser: 4.15 mg/dL — ABNORMAL HIGH (ref 0.44–1.00)
GFR, Estimated: 12 mL/min — ABNORMAL LOW (ref >60)
Glucose, Bld: 79 mg/dL (ref 70–99)
Potassium: 4.5 mmol/L (ref 3.5–5.1)
Sodium: 135 mmol/L (ref 135–145)
Total Bilirubin: 0.6 mg/dL (ref 0.3–1.2)
Total Protein: 5.8 g/dL — ABNORMAL LOW (ref 6.5–8.1)

## 2021-07-22 LAB — CBC
HCT: 23.8 % — ABNORMAL LOW (ref 36.0–46.0)
Hemoglobin: 7.3 g/dL — ABNORMAL LOW (ref 12.0–15.0)
MCH: 26.9 pg (ref 26.0–34.0)
MCHC: 30.7 g/dL (ref 30.0–36.0)
MCV: 87.8 fL (ref 80.0–100.0)
Platelets: 173 10*3/uL (ref 150–400)
RBC: 2.71 MIL/uL — ABNORMAL LOW (ref 3.87–5.11)
RDW: 16.9 % — ABNORMAL HIGH (ref 11.5–15.5)
WBC: 5.7 10*3/uL (ref 4.0–10.5)
nRBC: 0 % (ref 0.0–0.2)

## 2021-07-22 LAB — GLUCOSE, CAPILLARY
Glucose-Capillary: 76 mg/dL (ref 70–99)
Glucose-Capillary: 77 mg/dL (ref 70–99)
Glucose-Capillary: 92 mg/dL (ref 70–99)
Glucose-Capillary: 73 mg/dL (ref 70–99)

## 2021-07-22 LAB — ECHOCARDIOGRAM COMPLETE
AV Peak grad: 3 mmHg
Ao pk vel: 0.86 m/s
Height: 67 in
S' Lateral: 4.1 cm
Weight: 3389.79 oz

## 2021-07-22 LAB — TROPONIN I (HIGH SENSITIVITY): Troponin I (High Sensitivity): 215 ng/L (ref <18)

## 2021-07-22 LAB — PREPARE RBC (CROSSMATCH)

## 2021-07-22 MED ORDER — ZINC OXIDE 40 % EX OINT
TOPICAL_OINTMENT | CUTANEOUS | Status: DC
  Administered 2021-07-23: 1 via TOPICAL
  Filled 2021-07-22 (×2): qty 113

## 2021-07-22 MED ORDER — SODIUM CHLORIDE 0.9% IV SOLUTION: Freq: Once | INTRAVENOUS | Status: DC

## 2021-07-22 MED ORDER — FUROSEMIDE 10 MG/ML IJ SOLN
80.0000 mg | Freq: Two times a day (BID) | INTRAMUSCULAR | Status: DC
  Administered 2021-07-22 – 2021-07-23 (×2): 80 mg via INTRAVENOUS
  Filled 2021-07-22 (×2): qty 8

## 2021-07-22 MED ORDER — HALOPERIDOL LACTATE 5 MG/ML IJ SOLN
1.0000 mg | Freq: Four times a day (QID) | INTRAMUSCULAR | Status: DC | PRN
  Administered 2021-07-22 – 2021-07-23 (×2): 2 mg via INTRAVENOUS
  Filled 2021-07-22 (×2): qty 1

## 2021-07-22 NOTE — Progress Notes (Signed)
Foam dressings on buttocks and sacrum changed multiple times due to episodes of bowel incontinence.
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
## 2021-07-22 NOTE — Progress Notes (Signed)
Patient continues to refuse turns and side lying. Patient requests to remain in high fowlers despite education about pressure points and the cause for her pressure injuries. Patient refuses insulin stating she does not need it.

## 2021-07-22 NOTE — Consult Note (Signed)
WOC Nurse Consult Note: Reason for Consult:Stage 2 partial thickness pressure injury to buttocks, irritant contact dermatitis due to fecal incontinence. Patient was incontinent of stool several times tonight per Nursing documentation. I have communicated with her Bedside RN B. Harris via [employer]. Wound type: pressure plus moisture  ICD-10 CM Codes for Irritant Dermatitis  L24A2 - Due to fecal, urinary or dual incontinence  Pressure Injury POA: Yes Measurement:0.5cm round x 0.2cm Wound TMA:UQJF, moist Drainage (amount, consistency, odor) scant serous Periwound:erythematous Dressing procedure/placement/frequency: I will provide the patient with a pressure redistribution mattress with low air loss feature for pressure injury prevention and microclimate mitigation. The patient has not been willing to turn side to side or keep the [facility] in a therapeutic position (i.e., less than or equal to 30 degrees) despite education by the nurse that this position is deleterious to her skin. I will discontinue the use of the foam dressing to cover the areas of skin loss since they are having to be changed too frequently due to soiling and instead provide guidance for application of a zinc-based moisture barrier (Desitin) twice daily and PRN soiling. Guidance for turning and repositioning off of the supine position and for HOB elevation to be at a 30 degree angle or less is reinforced via the Orders.  Staff are instructed to use only DermaTherapy underpads beneath the patient; no disposable underpads.  [place] nursing team will not follow, but will remain available to this patient, the nursing and medical teams.  Please re-consult if needed. Thanks, Maudie Flakes, MSN, RN, [place], Ar[facility] Abbott  Pager# [phone]

## 2021-07-22 NOTE — Progress Notes (Signed)
*  PRELIMINARY RESULTS* Echocardiogram 2D Echocardiogram has been performed.  [name] 07/22/2021, 9:56 AM

## 2021-07-22 NOTE — Plan of Care (Signed)
Patient currently refusing lab work/type and screen.  Attempted to educate and re-direct, but still refused.  Will re-attempt as possible. Dr. Informed.

## 2021-07-22 NOTE — Progress Notes (Signed)
Patient refuses turns and wants to sit High Fowler's most of the time, stating this is how she sits at home. I educated and explained the effect of pressure/pressure points and the cause of her injuries. Patient continues to prefer high fowler (>70 degrees), no turns, and refuses side lying.

## 2021-07-23 DIAGNOSIS — I5041 Acute combined systolic (congestive) and diastolic (congestive) heart failure: Secondary | ICD-10-CM | POA: Diagnosis not present

## 2021-07-23 DIAGNOSIS — J810 Acute pulmonary edema: Secondary | ICD-10-CM | POA: Diagnosis not present

## 2021-07-23 DIAGNOSIS — N179 Acute kidney failure, unspecified: Secondary | ICD-10-CM | POA: Diagnosis not present

## 2021-07-23 DIAGNOSIS — R578 Other shock: Secondary | ICD-10-CM

## 2021-07-23 DIAGNOSIS — R778 Other specified abnormalities of plasma proteins: Secondary | ICD-10-CM | POA: Diagnosis not present

## 2021-07-23 DIAGNOSIS — K922 Gastrointestinal hemorrhage, unspecified: Secondary | ICD-10-CM

## 2021-07-23 DIAGNOSIS — K920 Hematemesis: Secondary | ICD-10-CM | POA: Diagnosis not present

## 2021-07-23 LAB — CBC
HCT: 28.7 % — ABNORMAL LOW (ref 36.0–46.0)
HCT: 32.2 % — ABNORMAL LOW (ref 36.0–46.0)
Hemoglobin: 8.8 g/dL — ABNORMAL LOW (ref 12.0–15.0)
Hemoglobin: 10.5 g/dL — ABNORMAL LOW (ref 12.0–15.0)
MCH: 26.1 pg (ref 26.0–34.0)
MCH: 27.4 pg (ref 26.0–34.0)
MCHC: 30.7 g/dL (ref 30.0–36.0)
MCHC: 32.6 g/dL (ref 30.0–36.0)
MCV: 85.2 fL (ref 80.0–100.0)
MCV: 84.1 fL (ref 80.0–100.0)
Platelets: 175 10*3/uL (ref 150–400)
Platelets: 144 10*3/uL — ABNORMAL LOW (ref 150–400)
RBC: 3.37 MIL/uL — ABNORMAL LOW (ref 3.87–5.11)
RBC: 3.83 MIL/uL — ABNORMAL LOW (ref 3.87–5.11)
RDW: 17.2 % — ABNORMAL HIGH (ref 11.5–15.5)
RDW: 16.7 % — ABNORMAL HIGH (ref 11.5–15.5)
WBC: 11.1 10*3/uL — ABNORMAL HIGH (ref 4.0–10.5)
WBC: 10.1 10*3/uL (ref 4.0–10.5)
nRBC: 0 % (ref 0.0–0.2)
nRBC: 0 % (ref 0.0–0.2)

## 2021-07-23 LAB — RETICULOCYTES
Immature Retic Fract: 28.6 % — ABNORMAL HIGH (ref 2.3–15.9)
RBC.: 3.19 MIL/uL — ABNORMAL LOW (ref 3.87–5.11)
Retic Count, Absolute: 47.5 10*3/uL (ref 19.0–186.0)
Retic Ct Pct: 1.5 % (ref 0.4–3.1)

## 2021-07-23 LAB — COMPREHENSIVE METABOLIC PANEL
ALT: 8 U/L (ref 0–44)
AST: 14 U/L — ABNORMAL LOW (ref 15–41)
Albumin: 2.7 g/dL — ABNORMAL LOW (ref 3.5–5.0)
Alkaline Phosphatase: 71 U/L (ref 38–126)
Anion gap: 9 (ref 5–15)
BUN: 64 mg/dL — ABNORMAL HIGH (ref 6–20)
CO2: 21 mmol/L — ABNORMAL LOW (ref 22–32)
Calcium: 7.4 mg/dL — ABNORMAL LOW (ref 8.9–10.3)
Chloride: 104 mmol/L (ref 98–111)
Creatinine, Ser: 3.98 mg/dL — ABNORMAL HIGH (ref 0.44–1.00)
GFR, Estimated: 13 mL/min — ABNORMAL LOW (ref >60)
Glucose, Bld: 85 mg/dL (ref 70–99)
Potassium: 4.4 mmol/L (ref 3.5–5.1)
Sodium: 134 mmol/L — ABNORMAL LOW (ref 135–145)
Total Bilirubin: 1 mg/dL (ref 0.3–1.2)
Total Protein: 5.9 g/dL — ABNORMAL LOW (ref 6.5–8.1)

## 2021-07-23 LAB — IRON AND TIBC
Iron: 132 ug/dL (ref 28–170)
Saturation Ratios: 41 % — ABNORMAL HIGH (ref 10.4–31.8)
TIBC: 325 ug/dL (ref 250–450)
UIBC: 193 ug/dL

## 2021-07-23 LAB — GLUCOSE, CAPILLARY
Glucose-Capillary: 66 mg/dL — ABNORMAL LOW (ref 70–99)
Glucose-Capillary: 100 mg/dL — ABNORMAL HIGH (ref 70–99)
Glucose-Capillary: 83 mg/dL (ref 70–99)
Glucose-Capillary: 69 mg/dL — ABNORMAL LOW (ref 70–99)
Glucose-Capillary: 71 mg/dL (ref 70–99)
Glucose-Capillary: 82 mg/dL (ref 70–99)
Glucose-Capillary: 72 mg/dL (ref 70–99)
Glucose-Capillary: 71 mg/dL (ref 70–99)
Glucose-Capillary: 108 mg/dL — ABNORMAL HIGH (ref 70–99)

## 2021-07-23 LAB — FERRITIN: Ferritin: 88 ng/mL (ref 11–307)

## 2021-07-23 LAB — PREPARE RBC (CROSSMATCH)

## 2021-07-23 LAB — VITAMIN B12: Vitamin B-12: 185 pg/mL (ref 180–914)

## 2021-07-23 LAB — MAGNESIUM
Magnesium: 1.6 mg/dL — ABNORMAL LOW (ref 1.7–2.4)
Magnesium: 1.7 mg/dL (ref 1.7–2.4)

## 2021-07-23 LAB — FOLATE: Folate: 5 ng/mL — ABNORMAL LOW (ref >5.9)

## 2021-07-23 LAB — PHOSPHORUS: Phosphorus: 8.6 mg/dL — ABNORMAL HIGH (ref 2.5–4.6)

## 2021-07-23 MED ORDER — LACTATED RINGERS IV BOLUS
1000.0000 mL | Freq: Once | INTRAVENOUS | Status: AC
  Administered 2021-07-23: 1000 mL via INTRAVENOUS

## 2021-07-23 MED ORDER — ALUM & MAG HYDROXIDE-SIMETH 200-200-20 MG/5ML PO SUSP
15.0000 mL | ORAL | Status: DC | PRN
  Administered 2021-07-23 – 2021-07-30 (×4): 15 mL via ORAL
  Filled 2021-07-23 (×4): qty 30

## 2021-07-23 MED ORDER — PANTOPRAZOLE INFUSION (NEW) - SIMPLE MED
8.0000 mg/h | INTRAVENOUS | Status: DC
  Administered 2021-07-23 – 2021-07-25 (×5): 8 mg/h via INTRAVENOUS
  Filled 2021-07-23: qty 100
  Filled 2021-07-23 (×5): qty 80

## 2021-07-23 MED ORDER — INSULIN ASPART 100 UNIT/ML IJ SOLN: 0.0000 [IU] | INTRAMUSCULAR | Status: DC

## 2021-07-23 MED ORDER — CHLORHEXIDINE GLUCONATE CLOTH 2 % EX PADS
6.0000 | MEDICATED_PAD | Freq: Every day | CUTANEOUS | Status: DC
  Administered 2021-07-23 – 2021-07-30 (×6): 6 via TOPICAL

## 2021-07-23 MED ORDER — SODIUM CHLORIDE 0.9% IV SOLUTION: Freq: Once | INTRAVENOUS | Status: DC

## 2021-07-23 MED ORDER — DEXTROSE 50 % IV SOLN
INTRAVENOUS | Status: AC
  Administered 2021-07-23: 12.5 g via INTRAVENOUS
  Filled 2021-07-23: qty 50

## 2021-07-23 MED ORDER — FOLIC ACID 1 MG PO TABS
1.0000 mg | ORAL_TABLET | Freq: Every day | ORAL | Status: DC
  Administered 2021-07-23 – 2021-07-30 (×5): 1 mg via ORAL
  Filled 2021-07-23 (×7): qty 1

## 2021-07-23 MED ORDER — SODIUM CHLORIDE 0.9% FLUSH: 10.0000 mL | INTRAVENOUS | Status: DC | PRN

## 2021-07-23 MED ORDER — RIFAXIMIN 550 MG PO TABS
550.0000 mg | ORAL_TABLET | Freq: Two times a day (BID) | ORAL | Status: DC
  Administered 2021-07-23 – 2021-07-30 (×12): 550 mg via ORAL
  Filled 2021-07-23 (×16): qty 1

## 2021-07-23 MED ORDER — DEXTROSE 50 % IV SOLN
12.5000 g | INTRAVENOUS | Status: AC
  Administered 2021-07-23: 12.5 g via INTRAVENOUS
  Filled 2021-07-23: qty 50

## 2021-07-23 MED ORDER — SODIUM CHLORIDE 0.9% FLUSH
10.0000 mL | Freq: Two times a day (BID) | INTRAVENOUS | Status: DC
  Administered 2021-07-23 – 2021-07-30 (×13): 10 mL

## 2021-07-23 MED ORDER — DEXTROSE 50 % IV SOLN: 12.5000 g | INTRAVENOUS | Status: AC

## 2021-07-23 MED ORDER — PANTOPRAZOLE SODIUM 40 MG IV SOLR: 40.0000 mg | Freq: Two times a day (BID) | INTRAVENOUS | Status: DC

## 2021-07-23 MED ORDER — LACTULOSE 10 GM/15ML PO SOLN
20.0000 g | Freq: Two times a day (BID) | ORAL | Status: DC | PRN
  Administered 2021-07-27: 20 g via ORAL
  Filled 2021-07-23: qty 30

## 2021-07-23 MED ORDER — MAGNESIUM SULFATE 2 GM/50ML IV SOLN
2.0000 g | Freq: Once | INTRAVENOUS | Status: AC
  Administered 2021-07-23: 2 g via INTRAVENOUS
  Filled 2021-07-23: qty 50

## 2021-07-23 MED ORDER — PANTOPRAZOLE 80MG IVPB - SIMPLE MED
80.0000 mg | Freq: Once | INTRAVENOUS | Status: AC
  Administered 2021-07-23: 80 mg via INTRAVENOUS
  Filled 2021-07-23: qty 80

## 2021-07-23 NOTE — Consult Note (Signed)
Cephas Darby, MD [address]  [place]  [place], [place] [address]  Main: [phone]  Fax: [phone] Pager: [phone]   Consultation  Referring Provider:     No ref. provider found Primary Care Physician:  Frazier Richards, MD Primary Gastroenterologist: [facility] gastroenterology         Reason for Consultation:     Coffee-ground emesis  Date of Admission:  07/20/2021 Date of Consultation:  07/23/2021         HPI:   [name] is a 55 y.o. female morbid obesity, metabolic syndrome, severe exocrine pancreatic insufficiency, ischemic cardiomyopathy, chronic A. fib on Xarelto who is admitted on 9/2 secondary to acute hypoxic respiratory failure from exacerbation of CHF secondary to medication noncompliance.  Patient has not been fully cognitively intact during the entire hospital course, currently being managed for CHF exacerbation.  She also developed AKI on CKD.  Patient is also found to have gradually declining hemoglobin.  Her hemoglobin was 7.8 on admission, declined to 7.3 yesterday.  This morning, patient developed large-volume coffee-ground emesis associated with tachycardia and mild hypotension, therefore transferred to ICU for further management.  She is started on pantoprazole drip and urgent GI consult has been placed.  Patient does have cirrhosis based on imaging from 03/29/2021 When I interviewed the patient in ICU, she was lethargic, denied any abdominal pain, able to answer simple questions.   NSAIDs: None  Antiplts/Anticoagulants/Anti thrombotics: Xarelto for history of A. fib  GI Procedures:  EGD and colonoscopy 03/13/2021 - Normal esophagus. - Normal stomach. - Mucosal nodule found in the duodenum. Biopsied.  - Presence of perianal scars and erythema in perianal area found on perianal exam. - The examined portion of the ileum was normal. Biopsied. - Five 3 to 12 mm polyps in the ascending colon, removed with a cold snare. Resected  and retrieved. - Four 4 to 8 mm polyps in the descending colon and in the transverse colon, removed with a cold snare. Resected and retrieved. - The rest of the examined colon is normal. Biopsied. - The distal rectum and anal verge are normal on retroflexion view.  FINAL MICROSCOPIC DIAGNOSIS:   A. SMALL BOWEL, BIOPSY:  - Unremarkable duodenal mucosa.  - No features of celiac sprue or granulomas.   B. COLON POLYP, DESCENDING, BIOPSY:  - Tubular adenoma (s).  - No high-grade dysplasia or carcinoma.   C. COLON POLYP, ASCENDING, BIOPSY:  - Tubular adenoma (s).  - No high-grade dysplasia or carcinoma.   D. COLON, RANDOM, BIOPSY:  - Unremarkable colonic mucosa.  - No microscopic colitis, active inflammation or granulomas.  - Fragment of unremarkable small bowel mucosa.   E. COLON POLYP, TRANSVERSE ,BIOPSY:  - Tubular adenoma (s).  - No high-grade dysplasia or carcinoma.  Past Medical History:  Diagnosis Date   Abnormal stress test    a. 02/2017 MV: large region of fixed perfusion defect in basal to mid inf, mid-dist inflat walls, EF 43%. No ischemia (EF 55-60% by f/u echo).   Arthritis    Asthma    Carotid arterial disease ([place])    a. 09/2017 Carotid U/S: 40-49% bilat ICA stenosis.   Chronic back pain    Coronary artery calcification seen on CT scan    a. 11/2017 CT Abd/Pelvis: Multi vessel coronary vascular Ca2+.   Depression    Diabetes mellitus    Diabetic neuropathy ([place])    Difficult intubation    DIFFICULT AIRWAY/FYI   Family history  of adverse reaction to anesthesia    mother had difficlty waking    Femoral-popliteal bypass graft occlusion, left ([place]) 12/02/2017   GERD (gastroesophageal reflux disease)    History of echocardiogram    a. 03/2017 Echo: EF 55-60%, mild LVH, nl RV fxn.   Hyperlipidemia    Ischemic cardiomyopathy    a. 04/2018 Echo: EF 30-35%, Gr2 DD, mild LVH. Mild MR. Mildly dil LA, mod dil RV w/ mod red RV fxn, mild TR, PASP 69mHg.   NSTEMI (non-ST  elevated myocardial infarction) ([place]    a. 05/2018 in setting of Afib, sepsis, and post-op L AKA. Peak trop 7.7. EF 30-35% by echo-->cath not performed 2/2 renal failure.   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 S/p L fem-pop bypass; b. 11/2017 s/p Aortobifem bypass 2/2 graft occlusion; c. 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   Paroxysmal atrial fibrillation with rapid ventricular response ([place] 12/02/2017   a. CHA2DS2VASc = 3-->Xarelto; b. 05/2018 Recurrent Afib-->amio.   Renal insufficiency    Ulcer    Foot    Past Surgical History:  Procedure Laterality Date   ABDOMINAL AORTAGRAM  June 15, 2014   ABDOMINAL AORTAGRAM N/A 06/15/2014   Procedure: ABDOMINAL AMaxcine Ham  Surgeon: VSerafina Mitchell MD;  [facility];  Service: Cardiovascular;  Laterality: N/A;   ABDOMINAL AORTAGRAM N/A 11/22/2014   Procedure: ABDOMINAL AMaxcine Ham  Surgeon: VSerafina Mitchell MD;  [facility];  Service: Cardiovascular;  Laterality: N/A;   ABDOMINAL AORTOGRAM W/LOWER EXTREMITY N/A 01/07/2017   Procedure: Abdominal Aortogram w/Lower Extremity;  Surgeon: VSerafina Mitchell MD;  [place];  Service: Cardiovascular;  Laterality: N/A;   ABDOMINAL AORTOGRAM W/LOWER EXTREMITY N/A 10/31/2017   Procedure: ABDOMINAL AORTOGRAM W/LOWER EXTREMITY;  Surgeon: FElam Dutch MD;  [place];  Service: Cardiovascular;  Laterality: N/A;   ABDOMINAL AORTOGRAM W/LOWER EXTREMITY N/A 03/24/2018   Procedure: ABDOMINAL AORTOGRAM W/LOWER EXTREMITY;  Surgeon: BSerafina Mitchell MD;  [place];  Service: Cardiovascular;  Laterality: N/A;   AMPUTATION Left 04/26/2018   Procedure: AMPUTATION ABOVE KNEE;  Surgeon: FElam Dutch MD;  [facility];  Service: Vascular;  Laterality: Left;   AORTA - BILATERAL FEMORAL ARTERY BYPASS GRAFT N/A 11/28/2017   Procedure: AORTA BIFEMORAL BYPASS  USING HEMASHIELD GOLD GRAFT & REIMPLANT IMA;  Surgeon: BSerafina Mitchell MD;  [facility];  Service: Vascular;  Laterality: N/A;   AORTIC ARCH ANGIOGRAPHY N/A 10/31/2017   Procedure: AORTIC ARCH ANGIOGRAPHY;  Surgeon: FElam Dutch MD;  [place];  Service: Cardiovascular;  Laterality: N/A;   APPLICATION OF WOUND VAC  11/28/2017   Procedure: APPLICATION OF WOUND VAC;  Surgeon: BSerafina Mitchell MD;  [place]  Service: Vascular;;   APPLICATION OF WOUND VAC Left 03/27/2018   Procedure: APPLICATION OF WOUND VAC LEFT GROIN USING PREVENA PLUS;  Surgeon: BSerafina Mitchell MD;  [place]  Service: Vascular;  Laterality: Left;   ARTERIAL BYPASS SURGERY   07/05/2010   Right Common Femoral to below knee popliteal BPG   BACK SURGERY     X's  2   BIOPSY  03/13/2021   Procedure: BIOPSY;  Surgeon: CHarvel Quale MD;  [facility];  Service: Gastroenterology;;   [place]  COLONOSCOPY WITH PROPOFOL N/A 03/13/2021   Procedure: COLONOSCOPY WITH PROPOFOL;  Surgeon: Harvel Quale, MD;  [facility];  Service: Gastroenterology;  Laterality: N/A;  with random colonic biopsies   CYSTECTOMY Left    wrist   EMBOLECTOMY Left 11/28/2017   Procedure: Left Lower Extremity Embolectomy, Left Tibial Peroneal Trunk Endarterectomy with Patch Angioplasty; Vein Harvest Small Saphenous Graft Left Lower Leg;  Surgeon: [name], MD;  Location: [place];  Service: Vascular;  Laterality: Left;   ESOPHAGOGASTRODUODENOSCOPY (EGD) WITH PROPOFOL N/A 03/13/2021   Procedure: ESOPHAGOGASTRODUODENOSCOPY (EGD) WITH PROPOFOL;  Surgeon: Harvel Quale, MD;  [facility];  Service: Gastroenterology;  Laterality: N/A;   EYE SURGERY Left 02/23/2020   Dr. Zadie Rhine   FEMORAL-POPLITEAL BYPASS GRAFT Left 03/27/2018   Procedure: THROMBECTOMY OF LEFT FEMORAL TIBIAL BYPASS;  Surgeon:  Serafina Mitchell, MD;  [facility];  Service: Vascular;  Laterality: Left;   FEMORAL-TIBIAL BYPASS GRAFT Left 03/27/2018   Procedure: BYPASS GRAFT FEMORAL-TIBIAL ARTERY LEFT REDO USING CRYOPRESERVED SAPHENOUS VEIN 70cm;  Surgeon: Serafina Mitchell, MD;  [facility];  Service: Vascular;  Laterality: Left;   INTERCOSTAL NERVE BLOCK  November 2015   INTRAOPERATIVE ARTERIOGRAM  11/28/2017   Procedure: INTRA OPERATIVE ARTERIOGRAM OF LEFT LEG;  Surgeon: Serafina Mitchell, MD;  Location: [place];  Service: Vascular;;   INTRAOPERATIVE ARTERIOGRAM Left 03/27/2018   Procedure: INTRA OPERATIVE ARTERIOGRAM TIMES TWO;  Surgeon: Serafina Mitchell, MD;  [facility];  Service: Vascular;  Laterality: Left;   IR FLUORO GUIDE CV LINE RIGHT  03/20/2017   IR FLUORO GUIDE CV LINE RIGHT  05/05/2018   IR REMOVAL TUN CV CATH W/O FL  05/20/2018   IR US GUIDE VASC ACCESS RIGHT  03/20/2017   IR US GUIDE VASC ACCESS RIGHT  05/05/2018   IRRIGATION AND DEBRIDEMENT BUTTOCKS Right 09/30/2016   Procedure: DEBRIDEMENT RIGHT  BUTTOCK WOUND;  Surgeon: Georganna Skeans, MD;  Location: [place];  Service: General;  Laterality: Right;   left foot surgery     PERIPHERAL VASCULAR CATHETERIZATION N/A 05/07/2016   Procedure: Abdominal Aortogram;  Surgeon: Serafina Mitchell, MD;  Location: [place] CV LAB;  Service: Cardiovascular;  Laterality: N/A;   PERIPHERAL VASCULAR CATHETERIZATION N/A 05/07/2016   Procedure: Lower Extremity Angiography;  Surgeon: Serafina Mitchell, MD;  Location: [place] CV LAB;  Service: Cardiovascular;  Laterality: N/A;   PERIPHERAL VASCULAR CATHETERIZATION N/A 05/07/2016   Procedure: Aortic Arch Angiography;  Surgeon: Serafina Mitchell, MD;  Location: [place] CV LAB;  Service: Cardiovascular;  Laterality: N/A;   PERIPHERAL VASCULAR CATHETERIZATION N/A 05/07/2016   Procedure: Upper Extremity Angiography;  Surgeon: Serafina Mitchell, MD;  Location: [place] CV LAB;  Service: Cardiovascular;  Laterality: N/A;    PERIPHERAL VASCULAR CATHETERIZATION Right 05/07/2016   Procedure: Peripheral Vascular Balloon Angioplasty;  Surgeon: Serafina Mitchell, MD;  Location: [place] CV LAB;  Service: Cardiovascular;  Laterality: Right;  subclavian   PERIPHERAL VASCULAR CATHETERIZATION Right 05/07/2016   Procedure: Peripheral Vascular Intervention;  Surgeon: Serafina Mitchell, MD;  Location: [place] CV LAB;  Service: Cardiovascular;  Laterality: Right;  External  Iliac   POLYPECTOMY  03/13/2021   Procedure: POLYPECTOMY;  Surgeon: Harvel Quale, MD;  [facility];  Service: Gastroenterology;;   SKIN GRAFT Right 2012   [name] by Dr. Nils Pyle- Right and Left Ankle   SPINE [place] ARTERY  11/28/2017   Procedure:  THROMBECTOMY  & REVISION OF BILATERAL FEMORAL TO POPLETEAL ARTERIES;  Surgeon: Serafina Mitchell, MD;  Location: [place];  Service: Vascular;;   TONSILLECTOMY     TRANSMETATARSAL AMPUTATION Right 10/07/2020   Procedure: RIGHT BELOW KNEE AMPUTATION;  Surgeon: Erle Crocker, MD;  Location: [place];  Service: Orthopedics;  Laterality: Right;    Prior to Admission medications   Medication Sig Start Date End Date Taking? Authorizing Provider  amitriptyline (ELAVIL) 25 MG tablet Take 1 tablet (25 mg total) by mouth at bedtime. 12/05/20  Yes Gerlene Fee, NP  diphenoxylate-atropine (LOMOTIL) 2.5-0.025 MG tablet Take 1 tablet by mouth 3 (three) times daily. 04/19/21  Yes Aliene Altes S, PA-C  insulin glargine (LANTUS) 100 UNIT/ML injection Inject 10-15 Units into the skin daily.   Yes [provider]  insulin lispro (HUMALOG KWIKPEN) 100 UNIT/ML KwikPen Inject 4-10 Units into the skin 3 (three) times daily with meals. If blood sugar 121-150, 2 units If blood sugar 151-200, 4 units If blood sugar 201-250, 6 units If blood sugar 251-300, 8 units If blood sugar 301-500, 10 units If blood sugar greater than 350 call MD 12/05/20  Yes Gerlene Fee, NP   lipase/protease/amylase (CREON) 36000 UNITS CPEP capsule Take 3 capsules (108,000 Units total) by mouth 3 (three) times daily with meals AND 2 capsules (72,000 Units total) with snacks. 04/19/21  Yes Aliene Altes S, PA-C  metoprolol tartrate (LOPRESSOR) 25 MG tablet Take 0.5 tablets (12.5 mg total) by mouth 2 (two) times daily. 12/05/20  Yes Gerlene Fee, NP  omeprazole (PRILOSEC) 40 MG capsule Take 1 capsule (40 mg total) by mouth daily. 12/05/20  Yes Gerlene Fee, NP  oxyCODONE-acetaminophen (PERCOCET) 5-325 MG tablet Take 1 tablet by mouth every 6 (six) hours as needed for severe pain. 07/16/21  Yes Mesner, Corene Cornea, MD  pregabalin (LYRICA) 100 MG capsule Take 1 capsule (100 mg total) by mouth 2 (two) times daily. 03/16/21  Yes Kathie Dike, MD  Rivaroxaban (XARELTO) 15 MG TABS tablet Take 1 tablet (15 mg total) by mouth daily with supper. 12/05/20  Yes Gerlene Fee, NP  sodium bicarbonate 650 MG tablet Take 1 tablet (650 mg total) by mouth 2 (two) times daily. 12/05/20  Yes Gerlene Fee, NP  SPIRIVA HANDIHALER 18 MCG inhalation capsule Place 1 capsule into inhaler and inhale daily. 06/01/21  Yes [provider]  torsemide (DEMADEX) 20 MG tablet Take 1 tablet (20 mg total) by mouth daily. 12/05/20  Yes Gerlene Fee, NP  albuterol (PROVENTIL) 2 MG tablet Take 1 tablet (2 mg total) by mouth daily as needed for shortness of breath. Patient not taking: No sig reported 12/05/20   Gerlene Fee, NP  amiodarone (PACERONE) 200 MG tablet Take 1 tablet (200 mg total) by mouth See admin instructions. oral amiodarone 459m bid x 7 days, then 2060mbid x 2 weeks, then 20048maily after that Patient not taking: No sig reported 12/05/20   GreGerlene FeeP  Balsam [place]-Castor Oil (VEUcsf [facility] Apply 1 application topically 3 (three) times daily. Every shift to bilateral buttocks, coccyx, and sacrum    [provider]  cholestyramine light (PREVALITE) 4 g packet Take 1 packet  (4 g total) by mouth 2 (two) times daily. Take either 2 hours before or 2 hours after usual medications are taken. 04/02/21 05/02/21  ShaManuella Ghaziratik D, DO  Choline Fenofibrate (FENOFIBRIC ACID) 45 MG CPDR Take 1 capsule by mouth daily. Patient not taking:  No sig reported 01/03/21   [provider]  dicyclomine (BENTYL) 10 MG capsule Take 1 capsule (10 mg total) by mouth every 6 (six) hours as needed for spasms. Patient not taking: No sig reported 12/05/20   Gerlene Fee, NP  Insulin Syringe-Needle U-100 (INSULIN SYRINGE 1CC/31GX5/16") 31G X 5/16" 1 ML MISC For use with administration of octreotide twice daily. 04/02/21   Manuella Ghazi, Pratik D, DO  naloxone [facility]) nasal spray 4 mg/0.1 mL Place 1 spray into the nose once as needed (opioid overdose). 12/05/20   Gerlene Fee, NP  nicotine (NICODERM CQ - DOSED IN MG/24 HOURS) 21 mg/24hr patch Place 21 mg onto the skin daily. Rotate sites and remove old patch prior to application    [provider]  Potassium Chloride ER 20 MEQ TBCR Take 20 mEq by mouth daily. 1 tab daily by mouth 12/05/20   Gerlene Fee, NP  PROAIR HFA 108 [phone] Base) MCG/ACT inhaler Inhale 1-2 puffs into the lungs every 6 (six) hours as needed for shortness of breath or wheezing. 06/01/21   [provider]  rosuvastatin (CRESTOR) 20 MG tablet Take 1 tablet (20 mg total) by mouth daily at 6 PM. Patient not taking: No sig reported 12/05/20   Gerlene Fee, NP  vitamin B-12 1000 MCG tablet Take 1 tablet (1,000 mcg total) by mouth daily. Patient not taking: No sig reported 10/12/20   Mckinley Jewel, MD    Current Facility-Administered Medications:    0.9 %  sodium chloride infusion (Manually program via Guardrails IV Fluids), , Intravenous, Once, Thereasa Solo, Kimberlee Nearing, MD   0.9 %  sodium chloride infusion (Manually program via Guardrails IV Fluids), , Intravenous, Once, Cherene Altes, MD, Last Rate: 10 mL/hr at 07/23/21 1039, IV Pump Association at 07/23/21  1039   acetaminophen (TYLENOL) tablet 650 mg, 650 mg, Oral, Q4H PRN, Clarnce Flock, MD, 650 mg at 07/21/21 8786   Chlorhexidine Gluconate Cloth 2 % PADS 6 each, 6 each, Topical, Daily, Cherene Altes, MD, 6 each at 76/72/09 4709   folic acid (FOLVITE) tablet 1 mg, 1 mg, Oral, Daily, Jeptha Hinnenkamp, Tally Due, MD   haloperidol lactate (HALDOL) injection 1-2 mg, 1-2 mg, Intravenous, Q6H PRN, Joette Catching T, MD, 2 mg at 07/22/21 1433   insulin aspart (novoLOG) injection 0-6 Units, 0-6 Units, Subcutaneous, Q4H, McClung, Kimberlee Nearing, MD   lactulose (CHRONULAC) 10 GM/15ML solution 20 g, 20 g, Oral, BID PRN, Marius Ditch, Tally Due, MD   liver oil-zinc oxide (DESITIN) 40 % ointment, , Topical, See admin instructions, Cherene Altes, MD, 1 application at 62/83/66 0541   magnesium sulfate IVPB 2 g 50 mL, 2 g, Intravenous, Once, Joette Catching T, MD, Last Rate: 50 mL/hr at 07/23/21 1047, 2 g at 07/23/21 1047   ondansetron (ZOFRAN) injection 4 mg, 4 mg, Intravenous, Q6H PRN, Clarnce Flock, MD, 4 mg at 07/23/21 0955   pantoprazole (PROTONIX) 80 mg /NS 100 mL IVPB, 80 mg, Intravenous, Once, Cherene Altes, MD   [START ON 07/26/2021] pantoprazole (PROTONIX) injection 40 mg, 40 mg, Intravenous, Q12H, McClung, Kimberlee Nearing, MD   pantoprozole (PROTONIX) 80 mg /NS 100 mL infusion, 8 mg/hr, Intravenous, Continuous, McClung, Kimberlee Nearing, MD   rifaximin Doreene Nest) tablet 550 mg, 550 mg, Oral, BID, Wilberta Dorvil, Tally Due, MD   tiotropium [facility]) inhalation capsule ([facility] use ONLY) 18 mcg, 1 capsule, Inhalation, Daily, Clarnce Flock, MD, 18 mcg at 07/22/21 1219  Family History  Problem Relation Age  of Onset   Coronary artery disease Mother    Peripheral vascular disease Mother    Heart disease Mother        Before age 30   Other Mother        Venous insuffiency   Diabetes Mother    Hyperlipidemia Mother    Hypertension Mother    Varicose Veins Mother    Heart attack Mother        before age 63    Heart disease Father    Diabetes Father    Diabetes Maternal Grandmother    Diabetes Paternal Grandmother    Diabetes Paternal Grandfather    Diabetes Sister    Hypertension Sister    Diabetes Brother    Hypertension Brother      Social History   Tobacco Use   Smoking status: Every Day    Packs/day: 0.50    Years: 30.00    Pack years: 15.00    Types: Cigarettes   Smokeless tobacco: Never  Vaping Use   Vaping Use: Never used  Substance Use Topics   Alcohol use: No    Alcohol/week: 0.0 standard drinks   Drug use: No    Allergies as of 07/20/2021 - Review Complete 07/20/2021  Allergen Reaction Noted   Lactose intolerance (gi) Diarrhea 05/04/2018    Review of Systems:    All systems reviewed and negative except where noted in HPI.   Physical Exam:  Vital signs in last 24 hours: Temp:  [97.5 F (36.4 C)-98.7 F (37.1 C)] 98.6 F (37 C) (09/05 1010) Pulse Rate:  [67-168] 168 (09/05 0932) Resp:  [14-20] 15 (09/05 1005) BP: (88-127)/(53-87) 112/87 (09/05 1005) SpO2:  [93 %-99 %] 97 % (09/05 0932) Weight:  [97.3 kg] 97.3 kg (09/05 0500) Last BM Date: 07/22/21 General: Lethargic, drowsy, arousable, NAD Head:  Normocephalic and atraumatic. Eyes:   No icterus.   Conjunctiva pink. PERRLA. Neck:  Supple; no masses or thyroidomegaly Lungs: Respirations even and unlabored. Lungs clear to auscultation bilaterally.   No wheezes, crackles, or rhonchi.  Heart: Tachycardia, without murmur, clicks, rubs or gallops Abdomen:  Soft, morbidly obese, nontender. Normal bowel sounds. No appreciable masses or hepatomegaly.  No rebound or guarding.  Rectal:  Not performed. Msk: Right BKA, left AKA Extremities:  Without edema, cyanosis or clubbing. Neurologic:  Alert and oriented to self Skin:  Intact without significant lesions or rashes.  LAB RESULTS: CBC Latest Ref Rng & Units 07/23/2021 07/22/2021 07/21/2021  WBC 4.0 - 10.5 K/uL 11.1(H) 5.7 5.7  Hemoglobin 12.0 - 15.0 g/dL 8.8(L) 7.3(L)  7.6(L)  Hematocrit 36.0 - 46.0 % 28.7(L) 23.8(L) 25.4(L)  Platelets 150 - 400 K/uL 175 173 202    BMET BMP Latest Ref Rng & Units 07/23/2021 07/22/2021 07/21/2021  Glucose 70 - 99 mg/dL 85 79 82  BUN 6 - 20 mg/dL 64(H) 57(H) 47(H)  Creatinine 0.44 - 1.00 mg/dL 3.98(H) 4.15(H) 3.61(H)  BUN/Creat Ratio 9 - 23 - - -  Sodium 135 - 145 mmol/L 134(L) 135 138  Potassium 3.5 - 5.1 mmol/L 4.4 4.5 4.5  Chloride 98 - 111 mmol/L 104 103 104  CO2 22 - 32 mmol/L 21(L) 23 24  Calcium 8.9 - 10.3 mg/dL 7.4(L) 7.2(L) 7.2(L)    LFT Hepatic Function Latest Ref Rng & Units 07/23/2021 07/22/2021 04/05/2021  Total Protein 6.5 - 8.1 g/dL 5.9(L) 5.8(L) 5.9(L)  Albumin 3.5 - 5.0 g/dL 2.7(L) 2.7(L) 2.2(L)  AST 15 - 41 U/L 14(L) 10(L) 16  ALT 0 -  44 U/L '8 8 13  ' Alk Phosphatase 38 - 126 U/L 71 80 97  Total Bilirubin 0.3 - 1.2 mg/dL 1.0 0.6 0.5  Bilirubin, Direct 0.0 - 0.2 mg/dL - - -     STUDIES: US RENAL  Result Date: 07/21/2021 CLINICAL DATA:  Acute renal failure. EXAM: RENAL / URINARY TRACT ULTRASOUND COMPLETE COMPARISON:  None. FINDINGS: Right Kidney: Renal measurements: 11.8 x 5.6 x 5.0 cm = volume: 171.9 mL. Echogenicity within normal limits. No mass or hydronephrosis visualized. Left Kidney: Renal measurements: 11.6 x 6.7 x 6.0 cm = volume: 246.2 mL. Echogenicity within normal limits. No mass or hydronephrosis visualized. Bladder: Appears normal for degree of bladder distention. Other: None. IMPRESSION: No hydronephrosis. Electronically Signed   By: Lovey Newcomer M.D.   On: 07/21/2021 11:50   ECHOCARDIOGRAM COMPLETE  Result Date: 07/22/2021    ECHOCARDIOGRAM REPORT   Patient Name:   [name] Date of Exam: 07/22/2021 Medical Rec #:  [id number]      Height:       67.0 in Accession #:    [id number]     Weight:       211.9 lb Date of Birth:  1966-10-16      BSA:          2.072 m Patient Age:    14 years       BP:           104/60 mmHg Patient Gender: F              HR:           78 bpm. Exam Location:  [facility]  Procedure: 2D Echo Indications:     NSTEMI I21.4  History:         Patient has prior history of Echocardiogram examinations, most                  recent 11/30/2020.  Sonographer:     Kathlen Brunswick RDCS Referring Phys:  [id number] [place] M ECKSTAT Diagnosing Phys: Kate Sable MD  Sonographer Comments: Technically difficult study due to poor echo windows. Image acquisition challenging due to uncooperative patient. IMPRESSIONS  1. Left ventricular ejection fraction, by estimation, is 25 to 30%. The left ventricle has severely decreased function. The left ventricle demonstrates global hypokinesis. Left ventricular diastolic parameters are indeterminate.  2. Right ventricular systolic function is normal. The right ventricular size is moderately enlarged.  3. Left atrial size was mild to moderately dilated.  4. Right atrial size was moderately dilated.  5. A small pericardial effusion is present.  6. The mitral valve is degenerative. Mild mitral valve regurgitation.  7. The aortic valve is tricuspid. Aortic valve regurgitation is not visualized. Mild aortic valve sclerosis is present, with no evidence of aortic valve stenosis. FINDINGS  Left Ventricle: Left ventricular ejection fraction, by estimation, is 25 to 30%. The left ventricle has severely decreased function. The left ventricle demonstrates global hypokinesis. The left ventricular internal cavity size was normal in size. There is no left ventricular hypertrophy. Left ventricular diastolic parameters are indeterminate. Right Ventricle: The right ventricular size is moderately enlarged. No increase in right ventricular wall thickness. Right ventricular systolic function is normal. Left Atrium: Left atrial size was mild to moderately dilated. Right Atrium: Right atrial size was moderately dilated. Pericardium: A small pericardial effusion is present. Mitral Valve: The mitral valve is degenerative in appearance. There is mild thickening of the mitral valve  leaflet(s). Mild to moderate mitral annular calcification. Mild  mitral valve regurgitation. Tricuspid Valve: The tricuspid valve is normal in structure. Tricuspid valve regurgitation is mild. Aortic Valve: The aortic valve is tricuspid. Aortic valve regurgitation is not visualized. Mild aortic valve sclerosis is present, with no evidence of aortic valve stenosis. Aortic valve peak gradient measures 3.0 mmHg. Pulmonic Valve: The pulmonic valve was grossly normal. Pulmonic valve regurgitation is trivial. Aorta: The aortic root is normal in size and structure. Venous: The inferior vena cava was not well visualized. IAS/Shunts: No atrial level shunt detected by color flow Doppler.  LEFT VENTRICLE PLAX 2D LVIDd:         5.00 cm LVIDs:         4.10 cm LV PW:         1.00 cm LV IVS:        0.90 cm LVOT diam:     1.70 cm LVOT Area:     2.27 cm  LEFT ATRIUM         Index LA diam:    4.70 cm 2.27 cm/m  AORTIC VALVE             PULMONIC VALVE AV Vmax:      86.00 cm/s PV Vmax:       1.09 m/s AV Peak Grad: 3.0 mmHg   PV Peak grad:  4.8 mmHg  AORTA Ao Root diam: 2.40 cm Ao Asc diam:  2.30 cm TRICUSPID VALVE TV Peak grad:   28.7 mmHg TV Vmax:        2.68 m/s  SHUNTS Systemic Diam: 1.70 cm Kate Sable MD Electronically signed by Kate Sable MD Signature Date/Time: 07/22/2021/4:00:43 PM    Final       Impression / Plan:   [name] is a 55 y.o. female with metabolic syndrome, severe EPI, multiple comorbidities, ischemic cardiomyopathy EF of 35%, CHF, chronic A. fib on Xarelto as outpatient, held since 9/2 is admitted with acute on chronic CHF with acute hypoxic respiratory failure, also has altered mental status.  GI is consulted today for large volume coffee-ground emesis  Coffee-ground emesis Differentials include severe erosive esophagitis or erosive gastritis or peptic ulcer disease Patient does have cirrhosis, very less likely variceal bleed, patient had EGD in 4/22 which showed normal  esophagus Patient received PRBCs, responded appropriately to blood transfusion, hemoglobin improved to 8.8 today Agree with pantoprazole drip N.p.o. 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  Titrate lactulose dose to prevent diarrhea and further worsening of AKI  Thank you for involving me in the care of this patient.  GI will follow along with you    LOS: 3 days   Sherri Sear, MD  07/23/2021, 11:03 AM    Note: This dictation was prepared with Dragon dictation along with smaller phrase technology. Any transcriptional errors that result from this process are unintentional.

## 2021-07-23 NOTE — Progress Notes (Signed)
Red MEWS due to Aib RVR HR 140s/ pt asymptomatic / MD at bedside to assess pt/ charge nurse made aware/ will continue to monitor closely

## 2021-07-23 NOTE — Consult Note (Signed)
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 my specialty: no    This document was prepared using Dragon voice recognition software and may include unintentional dictation errors.    Ottie Glazier, M.D.  Division of [place]

## 2021-07-23 NOTE — Progress Notes (Signed)
Pt vomiting large amount of black coffee ground emesis/ pts HR continues to sustain 160s/ BP low 88/62 / MD and CCU charge nurse called to bedside/ orders tp transfer to CCU

## 2021-07-23 NOTE — Progress Notes (Signed)
Upon entering patient's room to complete morning VS and administer medications, nurse visualized a large amount of brown/black, vomit covering the floor, bedside table, and patient. Patient bathed and CHG wiped and room cleaned thoroughly. Patient CBG 66, dextrose administered per hypoglycemic protocol. Glucose now 100. VS stable. Patient alert and responsive, displaying no s/s of distress. Zofran administered. Continue to monitor.

## 2021-07-23 NOTE — Progress Notes (Signed)
Patient husband updated that patient transferred to ICU 3 by this RN via telephone call.
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
## 2021-07-24 DIAGNOSIS — J810 Acute pulmonary edema: Secondary | ICD-10-CM | POA: Diagnosis not present

## 2021-07-24 DIAGNOSIS — Z01810 Encounter for preprocedural cardiovascular examination: Secondary | ICD-10-CM

## 2021-07-24 DIAGNOSIS — K920 Hematemesis: Secondary | ICD-10-CM | POA: Diagnosis not present

## 2021-07-24 DIAGNOSIS — I5023 Acute on chronic systolic (congestive) heart failure: Secondary | ICD-10-CM | POA: Diagnosis not present

## 2021-07-24 DIAGNOSIS — I4891 Unspecified atrial fibrillation: Secondary | ICD-10-CM | POA: Diagnosis not present

## 2021-07-24 LAB — MAGNESIUM: Magnesium: 2.1 mg/dL (ref 1.7–2.4)

## 2021-07-24 LAB — BPAM RBC
Blood Product Expiration Date: 202209082359
Blood Product Expiration Date: 202209082359
Blood Product Expiration Date: 202210052359
ISSUE DATE / TIME: 202209041744
ISSUE DATE / TIME: 202209051213
ISSUE DATE / TIME: 202209051624
Unit Type and Rh: 5100
Unit Type and Rh: 9500
Unit Type and Rh: 9500

## 2021-07-24 LAB — TYPE AND SCREEN
ABO/RH(D): O NEG
Antibody Screen: NEGATIVE
Unit division: 0
Unit division: 0
Unit division: 0

## 2021-07-24 LAB — GLUCOSE, CAPILLARY
Glucose-Capillary: 75 mg/dL (ref 70–99)
Glucose-Capillary: 80 mg/dL (ref 70–99)
Glucose-Capillary: 82 mg/dL (ref 70–99)
Glucose-Capillary: 88 mg/dL (ref 70–99)
Glucose-Capillary: 115 mg/dL — ABNORMAL HIGH (ref 70–99)
Glucose-Capillary: 184 mg/dL — ABNORMAL HIGH (ref 70–99)

## 2021-07-24 LAB — CBC
HCT: 31 % — ABNORMAL LOW (ref 36.0–46.0)
Hemoglobin: 10 g/dL — ABNORMAL LOW (ref 12.0–15.0)
MCH: 26.8 pg (ref 26.0–34.0)
MCHC: 32.3 g/dL (ref 30.0–36.0)
MCV: 83.1 fL (ref 80.0–100.0)
Platelets: 137 10*3/uL — ABNORMAL LOW (ref 150–400)
RBC: 3.73 MIL/uL — ABNORMAL LOW (ref 3.87–5.11)
RDW: 16.9 % — ABNORMAL HIGH (ref 11.5–15.5)
WBC: 9.3 10*3/uL (ref 4.0–10.5)
nRBC: 0 % (ref 0.0–0.2)

## 2021-07-24 LAB — BASIC METABOLIC PANEL
Anion gap: 9 (ref 5–15)
BUN: 63 mg/dL — ABNORMAL HIGH (ref 6–20)
CO2: 23 mmol/L (ref 22–32)
Calcium: 7.7 mg/dL — ABNORMAL LOW (ref 8.9–10.3)
Chloride: 103 mmol/L (ref 98–111)
Creatinine, Ser: 3.86 mg/dL — ABNORMAL HIGH (ref 0.44–1.00)
GFR, Estimated: 13 mL/min — ABNORMAL LOW (ref >60)
Glucose, Bld: 80 mg/dL (ref 70–99)
Potassium: 4.2 mmol/L (ref 3.5–5.1)
Sodium: 135 mmol/L (ref 135–145)

## 2021-07-24 LAB — PHOSPHORUS: Phosphorus: 8.3 mg/dL — ABNORMAL HIGH (ref 2.5–4.6)

## 2021-07-24 MED ORDER — INSULIN ASPART 100 UNIT/ML IJ SOLN
0.0000 [IU] | Freq: Three times a day (TID) | INTRAMUSCULAR | Status: DC
  Administered 2021-07-25: 3 [IU] via SUBCUTANEOUS
  Filled 2021-07-24: qty 1

## 2021-07-24 MED ORDER — MAGNESIUM SULFATE 2 GM/50ML IV SOLN
2.0000 g | Freq: Once | INTRAVENOUS | Status: AC
  Administered 2021-07-24: 2 g via INTRAVENOUS
  Filled 2021-07-24: qty 50

## 2021-07-24 NOTE — Progress Notes (Signed)
   07/23/21 0832  Assess: MEWS Score  Temp (!) 97.5 F (36.4 C)  BP 93/71  Pulse Rate (!) 142  Resp 14  SpO2 95 %  O2 Device Room Air  Assess: MEWS Score  MEWS Temp 0  MEWS Systolic 1  MEWS Pulse 3  MEWS RR 0  MEWS LOC 0  MEWS Score 4  MEWS Score Color Red  Assess: if the MEWS score is Yellow or Red  Were vital signs taken at a resting state? Yes  Focused Assessment No change from prior assessment  Does the patient meet 2 or more of the SIRS criteria? Yes  MEWS guidelines implemented *See Row Information* Yes  Treat  MEWS Interventions Other (Comment) (MD at bedside)  Pain Scale 0-10  Pain Score 0  Take Vital Signs  Increase Vital Sign Frequency  Red: Q 1hr X 4 then Q 4hr X 4, if remains red, continue Q 4hrs  Escalate  MEWS: Escalate Red: discuss with charge nurse/RN and provider, consider discussing with RRT  Notify: Charge Nurse/RN  Name of Charge Nurse/RN Notified Lysbeth Galas  Date Charge Nurse/RN Notified 07/23/21  Time Charge Nurse/RN Notified [id number]  Notify: Provider  Provider Name/Title McClung  Date Provider Notified 07/23/21  Time Provider Notified [phone]  Notification Type Face-to-face  Notification Reason Other (Comment) (HR 140s)  Provider response At bedside  Date of Provider Response 07/23/21  Time of Provider Response [phone]  Document  Patient Outcome Other (Comment)  Progress note created (see row info) Yes  Assess: SIRS CRITERIA  SIRS Temperature  0  SIRS Pulse 1  SIRS Respirations  0  SIRS WBC 0  SIRS Score Sum  1  Inserted for [employer] RN

## 2021-07-24 NOTE — Consult Note (Addendum)
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 Donavan Foil M.D.   On: 07/20/2021 16:53   US RENAL  Result Date: 07/21/2021 CLINICAL DATA:  Acute renal failure. EXAM: RENAL / URINARY TRACT ULTRASOUND COMPLETE COMPARISON:  None. FINDINGS: Right Kidney: Renal measurements: 11.8 x 5.6 x 5.0 cm = volume: 171.9 mL. Echogenicity within normal limits. No mass or hydronephrosis visualized. Left Kidney: Renal measurements: 11.6 x 6.7 x 6.0 cm = volume: 246.2 mL. Echogenicity within normal limits. No mass or hydronephrosis visualized. Bladder: Appears normal for degree of bladder distention. Other: None. IMPRESSION: No hydronephrosis. Electronically Signed   By: Lovey Newcomer M.D.   On: 07/21/2021 11:50   ECHOCARDIOGRAM COMPLETE  Result Date: 07/22/2021    ECHOCARDIOGRAM REPORT   Patient Name:   YEILA MORRO Kimberley Date of Exam: 07/22/2021 Medical Rec #:   [id number]      Height:       67.0 in Accession #:    [id number]     Weight:       211.9 lb Date of Birth:  [birth year]      BSA:          2.072 m Patient Age:    29 years       BP:           104/60 mmHg Patient Gender: F              HR:           78 bpm. Exam Location:  [facility] Procedure: 2D Echo Indications:     NSTEMI I21.4  History:         Patient has prior history of Echocardiogram examinations, most                  recent 11/30/2020.  Sonographer:     Kathlen Brunswick RDCS Referring Phys:  [id number] [place] M ECKSTAT Diagnosing Phys: Kate Sable MD  Sonographer Comments: Technically difficult study due to poor echo windows. Image acquisition challenging due to uncooperative patient. IMPRESSIONS  1. Left ventricular ejection fraction, by estimation, is 25 to 30%. The left ventricle has severely decreased function. The left ventricle demonstrates global hypokinesis. Left ventricular diastolic parameters are indeterminate.  2. Right ventricular systolic function is normal. The right ventricular size is moderately enlarged.  3. Left atrial size was mild to moderately dilated.  4. Right atrial size was moderately dilated.  5. A small pericardial effusion is present.  6. The mitral valve is degenerative. Mild mitral valve regurgitation.  7.  The aortic valve is tricuspid. Aortic valve regurgitation is not visualized. Mild aortic valve sclerosis is present, with no evidence of aortic valve stenosis. FINDINGS  Left Ventricle: Left ventricular ejection fraction, by estimation, is 25 to 30%. The left ventricle has severely decreased function. The left ventricle demonstrates global hypokinesis. The left ventricular internal cavity size was normal in size. There is no left ventricular hypertrophy. Left ventricular diastolic parameters are indeterminate. Right Ventricle: The right ventricular size is moderately enlarged. No increase in right ventricular wall thickness. Right ventricular systolic function is normal. Left  Atrium: Left atrial size was mild to moderately dilated. Right Atrium: Right atrial size was moderately dilated. Pericardium: A small pericardial effusion is present. Mitral Valve: The mitral valve is degenerative in appearance. There is mild thickening of the mitral valve leaflet(s). Mild to moderate mitral annular calcification. Mild mitral valve regurgitation. Tricuspid Valve: The tricuspid valve is normal in structure. Tricuspid valve regurgitation is mild. Aortic Valve: The aortic valve is tricuspid. Aortic valve regurgitation is not visualized. Mild aortic valve sclerosis is present, with no evidence of aortic valve stenosis. Aortic valve peak gradient measures 3.0 mmHg. Pulmonic Valve: The pulmonic valve was grossly normal. Pulmonic valve regurgitation is trivial. Aorta: The aortic root is normal in size and structure. Venous: The inferior vena cava was not well visualized. IAS/Shunts: No atrial level shunt detected by color flow Doppler.  LEFT VENTRICLE PLAX 2D LVIDd:         5.00 cm LVIDs:         4.10 cm LV PW:         1.00 cm LV IVS:        0.90 cm LVOT diam:     1.70 cm LVOT Area:     2.27 cm  LEFT ATRIUM         Index LA diam:    4.70 cm 2.27 cm/m  AORTIC VALVE             PULMONIC VALVE AV Vmax:      86.00 cm/s PV Vmax:       1.09 m/s AV Peak Grad: 3.0 mmHg   PV Peak grad:  4.8 mmHg  AORTA Ao Root diam: 2.40 cm Ao Asc diam:  2.30 cm TRICUSPID VALVE TV Peak grad:   28.7 mmHg TV Vmax:        2.68 m/s  SHUNTS Systemic Diam: 1.70 cm Kate Sable MD Electronically signed by Kate Sable MD Signature Date/Time: 07/22/2021/4:00:43 PM    Final      Assessment and Plan:   Preoperative cardiac evaluation --No current chest pain but does recall previous right-sided chest pain in the setting of right rib and clavicle fracture.  Active cardiac conditions include recent exacerbation of systolic heart failure and elevated HS Tn likely supply demand ischemia in the setting of her comorbid conditions  but also with presumed underlying coronary ischemia.  EKG without acute ST/T changes.  Telemetry shows NSR.  Last EF 25-30%.  She has presumed ICM, though ischemic workup with LHC deferred over the years in the past given history of severe vascular disease, as well as that she does not wish to undergo femoral access or be exposed to contrast in the setting of her CKD4.  Other PMH includes DM2 on insulin. She also has history of atrial fibrillation with RVR with 7.2% stroke risk rate / year and [place] held with current possible GIB and drop in Hgb.  Currently, pt has AMS in the setting of  her current illness.  Functional capacity low at 1-4 METS given wheelchair bound / amputations.  Surgery specific risk associated with EGD low at less than 1%.  Latest labs show Cr 3.86, BUN 63, Hgb 10.0, HCT 31.0.  --Calculated RCRI risk class IV at 11% risk of MACE placing her at elevated or high risk cardiac risk for any procedure but no plan for further cardiac workup at this time / before the EGD. No plan for LHC given her comorbidities including CKD4, blood loss anemia, and severe vascular dz. She is currently hemodynamically stable.  Ideally, she would have received at least 48 hours of IV heparin for medical management, though this is of course precluded by her blood loss anemia. MD to see patient with further recommendations if needed at that time.   For questions or updates, please contact [facility] Please consult [email or web address] for contact info under    Signed, Arvil Chaco, PA-C  07/24/2021 9:38 AM

## 2021-07-24 NOTE — Progress Notes (Signed)
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  TRANSFUSIONS AS NEEDED MONITOR FSBS ASSESS the need for LABS as needed   Critical care provider statement:    Critical care time (minutes):  33   Critical care time was exclusive of:  Separately billable procedures and treating other patients   Critical care was necessary to treat or prevent imminent or life-threatening deterioration of the following conditions:  circulatory shock, GI bleed, CHF, multiple comorbid conditions   Critical care was time spent personally by me on the following activities:   Development of treatment plan with patient or surrogate, discussions with consultants, evaluation of patient's response to treatment, examination of patient, obtaining history from patient or surrogate, ordering and performing treatments and interventions, ordering and review of laboratory studies and re-evaluation of patient's condition.  I assumed direction of critical care for this patient from another provider in my specialty: no    This document was prepared using Dragon voice recognition software and may include unintentional dictation errors.    Ottie Glazier, M.D.  Division of [facility]

## 2021-07-24 NOTE — Progress Notes (Signed)
[name] , MD [address] [place], [place], [place], [address] [address], [place], [place], [place], [address] Phone: [phone]  Fax: [phone]   [name] is being followed for acute GI bleed   Subjective: Feels much better, no shortness of breath. Able to lay flat    Objective: Vital signs in last 24 hours: Vitals:   07/24/21 0500 07/24/21 0600 07/24/21 0700 07/24/21 0729  BP: (!) 99/58 115/61 98/63   Pulse: 80 79 80 77  Resp:      Temp: 99 F (37.2 C)   97.9 F (36.6 C)  TempSrc: Oral   Oral  SpO2: 92% 96% 96% 99%  Weight: 95.1 kg     Height:       Weight change: -2.2 kg  Intake/Output Summary (Last 24 hours) at 07/24/2021 0756 Last data filed at 07/24/2021 0600 Gross per 24 hour  Intake 1400.75 ml  Output 350 ml  Net 1050.75 ml     Exam: Heart:: Regular rate and rhythm, S1S2 present, or without murmur or extra heart sounds Lungs: normal Abdomen: soft, nontender, normal bowel sounds   Lab Results: @LABTEST2 @ Micro Results: Recent Results (from the past 240 hour(s))  Resp Panel by RT-PCR (Flu A&B, Covid) Nasopharyngeal Swab     Status: None   Collection Time: 07/20/21  6:14 PM   Specimen: Nasopharyngeal Swab; Nasopharyngeal(NP) swabs in vial transport medium  Result Value Ref Range Status   SARS Coronavirus 2 by RT PCR NEGATIVE NEGATIVE Final    Comment: (NOTE) SARS-CoV-2 target nucleic acids are NOT DETECTED.  The SARS-CoV-2 RNA is generally detectable in upper respiratory specimens during the acute phase of infection. The lowest concentration of SARS-CoV-2 viral copies this assay can detect is 138 copies/mL. A negative result does not preclude SARS-Cov-2 infection and should not be used as the sole basis for treatment or other patient management decisions. A negative result may occur with  improper specimen collection/handling, submission of specimen other than nasopharyngeal swab, presence of viral mutation(s) within the areas  targeted by this assay, and inadequate number of viral copies(<138 copies/mL). A negative result must be combined with clinical observations, patient history, and epidemiological information. The expected result is Negative.  Fact Sheet for Patients:  EntrepreneurPulse.com.au  Fact Sheet for Healthcare Providers:  IncredibleEmployment.be  This test is no t yet approved or cleared by the [place] FDA and  has been authorized for detection and/or diagnosis of SARS-CoV-2 by FDA under an Emergency Use Authorization (EUA). This EUA will remain  in effect (meaning this test can be used) for the duration of the COVID-19 declaration under Section 564(b)(1) of the Act, 21 U.S.C.section 360bbb-3(b)(1), unless the authorization is terminated  or revoked sooner.       Influenza A by PCR NEGATIVE NEGATIVE Final   Influenza B by PCR NEGATIVE NEGATIVE Final    Comment: (NOTE) The Xpert Xpress SARS-CoV-2/FLU/RSV plus assay is intended as an aid in the diagnosis of influenza from Nasopharyngeal swab specimens and should not be used as a sole basis for treatment. Nasal washings and aspirates are unacceptable for Xpert Xpress SARS-CoV-2/FLU/RSV testing.  Fact Sheet for Patients: EntrepreneurPulse.com.au  Fact Sheet for Healthcare Providers: IncredibleEmployment.be  This test is not yet approved or cleared by the [place] FDA and has been authorized for detection and/or diagnosis of SARS-CoV-2 by FDA under an Emergency Use Authorization (EUA). This EUA will remain in effect (meaning this test can be used) for the duration of the  COVID-19 declaration under Section 564(b)(1) of the Act, 21 U.S.C. section 360bbb-3(b)(1), unless the authorization is terminated or revoked.  Performed at [facility], [address] [place], [place] [address]    Studies/Results: ECHOCARDIOGRAM COMPLETE  Result Date:  07/22/2021    ECHOCARDIOGRAM REPORT   Patient Name:   [name] Date of Exam: 07/22/2021 Medical Rec #:  [id number]      Height:       67.0 in Accession #:    [id number]     Weight:       211.9 lb Date of Birth:  Jul 05, 1966      BSA:          2.072 m Patient Age:    55 years       BP:           104/60 mmHg Patient Gender: F              HR:           78 bpm. Exam Location:  [facility] Procedure: 2D Echo Indications:     NSTEMI I21.4  History:         Patient has prior history of Echocardiogram examinations, most                  recent 11/30/2020.  Sonographer:     Kathlen Brunswick RDCS Referring Phys:  [id number] Kimble M ECKSTAT Diagnosing Phys: Kate Sable MD  Sonographer Comments: Technically difficult study due to poor echo windows. Image acquisition challenging due to uncooperative patient. IMPRESSIONS  1. Left ventricular ejection fraction, by estimation, is 25 to 30%. The left ventricle has severely decreased function. The left ventricle demonstrates global hypokinesis. Left ventricular diastolic parameters are indeterminate.  2. Right ventricular systolic function is normal. The right ventricular size is moderately enlarged.  3. Left atrial size was mild to moderately dilated.  4. Right atrial size was moderately dilated.  5. A small pericardial effusion is present.  6. The mitral valve is degenerative. Mild mitral valve regurgitation.  7. The aortic valve is tricuspid. Aortic valve regurgitation is not visualized. Mild aortic valve sclerosis is present, with no evidence of aortic valve stenosis. FINDINGS  Left Ventricle: Left ventricular ejection fraction, by estimation, is 25 to 30%. The left ventricle has severely decreased function. The left ventricle demonstrates global hypokinesis. The left ventricular internal cavity size was normal in size. There is no left ventricular hypertrophy. Left ventricular diastolic parameters are indeterminate. Right Ventricle: The right ventricular size is moderately  enlarged. No increase in right ventricular wall thickness. Right ventricular systolic function is normal. Left Atrium: Left atrial size was mild to moderately dilated. Right Atrium: Right atrial size was moderately dilated. Pericardium: A small pericardial effusion is present. Mitral Valve: The mitral valve is degenerative in appearance. There is mild thickening of the mitral valve leaflet(s). Mild to moderate mitral annular calcification. Mild mitral valve regurgitation. Tricuspid Valve: The tricuspid valve is normal in structure. Tricuspid valve regurgitation is mild. Aortic Valve: The aortic valve is tricuspid. Aortic valve regurgitation is not visualized. Mild aortic valve sclerosis is present, with no evidence of aortic valve stenosis. Aortic valve peak gradient measures 3.0 mmHg. Pulmonic Valve: The pulmonic valve was grossly normal. Pulmonic valve regurgitation is trivial. Aorta: The aortic root is normal in size and structure. Venous: The inferior vena cava was not well visualized. IAS/Shunts: No atrial level shunt detected by color flow Doppler.  LEFT VENTRICLE PLAX 2D LVIDd:  5.00 cm LVIDs:         4.10 cm LV PW:         1.00 cm LV IVS:        0.90 cm LVOT diam:     1.70 cm LVOT Area:     2.27 cm  LEFT ATRIUM         Index LA diam:    4.70 cm 2.27 cm/m  AORTIC VALVE             PULMONIC VALVE AV Vmax:      86.00 cm/s PV Vmax:       1.09 m/s AV Peak Grad: 3.0 mmHg   PV Peak grad:  4.8 mmHg  AORTA Ao Root diam: 2.40 cm Ao Asc diam:  2.30 cm TRICUSPID VALVE TV Peak grad:   28.7 mmHg TV Vmax:        2.68 m/s  SHUNTS Systemic Diam: 1.70 cm Kate Sable MD Electronically signed by Kate Sable MD Signature Date/Time: 07/22/2021/4:00:43 PM    Final    Medications: I have reviewed the patient's current medications. Scheduled Meds:  sodium chloride   Intravenous Once   sodium chloride   Intravenous Once   Chlorhexidine Gluconate Cloth  6 each Topical Daily   folic acid  1 mg Oral Daily    insulin aspart  0-6 Units Subcutaneous Q4H   liver oil-zinc oxide   Topical See admin instructions   [START ON 07/26/2021] pantoprazole  40 mg Intravenous Q12H   rifaximin  550 mg Oral BID   sodium chloride flush  10-40 mL Intracatheter Q12H   tiotropium  1 capsule Inhalation Daily   Continuous Infusions:  pantoprazole 8 mg/hr (07/24/21 0600)   PRN Meds:.acetaminophen, alum & mag hydroxide-simeth, haloperidol lactate, lactulose, ondansetron (ZOFRAN) IV, sodium chloride flush   Assessment: Active Problems:   Peripheral vascular disease (HCC)   Acute respiratory failure with hypoxia (HCC)   Acute combined systolic and diastolic heart failure (HCC)   Non-ST elevation (NSTEMI) myocardial infarction [facility])   Atrial fibrillation with rapid ventricular response (HCC)   Type 2 diabetes mellitus with peripheral neuropathy ([place])   Coronary artery disease involving native coronary artery of native heart without angina pectoris   Ischemic cardiomyopathy   Essential hypertension   Controlled type 2 diabetes mellitus with stage 4 chronic kidney disease, with long-term current use of insulin (HCC)   Acute on chronic systolic (congestive) heart failure (HCC)   Elevated troponin   Pressure injury of skin  [name] 55 y.o. female with ischemic cardiomyopathy EF 35 %, A fibb on xarelto , admitted with acute CHF,AMS and GI following for coffee ground emesis. Patient has cirrhosis. . Normal EGD in 03/09/2021. Being treated for hepatic encephelopathy . Hb stable at 10 grams .  Plan: Breathing appears much better, When cleared by cards will plan for EGD. She has not made up her mind if she wants to do an EGD yet .     LOS: 4 days   Jamie Bellows, MD 07/24/2021, 7:56 AM

## 2021-07-24 NOTE — Progress Notes (Signed)
[id number] report called to RN for room change 244
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
## 2021-07-25 ENCOUNTER — Other Ambulatory Visit: Payer: Self-pay

## 2021-07-25 DIAGNOSIS — S2231XD Fracture of one rib, right side, subsequent encounter for fracture with routine healing: Secondary | ICD-10-CM

## 2021-07-25 DIAGNOSIS — J810 Acute pulmonary edema: Secondary | ICD-10-CM

## 2021-07-25 DIAGNOSIS — R778 Other specified abnormalities of plasma proteins: Secondary | ICD-10-CM | POA: Diagnosis not present

## 2021-07-25 DIAGNOSIS — K920 Hematemesis: Secondary | ICD-10-CM | POA: Diagnosis not present

## 2021-07-25 DIAGNOSIS — N179 Acute kidney failure, unspecified: Secondary | ICD-10-CM | POA: Diagnosis not present

## 2021-07-25 DIAGNOSIS — I4891 Unspecified atrial fibrillation: Secondary | ICD-10-CM

## 2021-07-25 LAB — BASIC METABOLIC PANEL
Anion gap: 10 (ref 5–15)
BUN: 66 mg/dL — ABNORMAL HIGH (ref 6–20)
CO2: 21 mmol/L — ABNORMAL LOW (ref 22–32)
Calcium: 7.6 mg/dL — ABNORMAL LOW (ref 8.9–10.3)
Chloride: 102 mmol/L (ref 98–111)
Creatinine, Ser: 3.71 mg/dL — ABNORMAL HIGH (ref 0.44–1.00)
GFR, Estimated: 14 mL/min — ABNORMAL LOW (ref >60)
Glucose, Bld: 99 mg/dL (ref 70–99)
Potassium: 4.2 mmol/L (ref 3.5–5.1)
Sodium: 133 mmol/L — ABNORMAL LOW (ref 135–145)

## 2021-07-25 LAB — CBC WITH DIFFERENTIAL/PLATELET
Abs Immature Granulocytes: 0.04 10*3/uL (ref 0.00–0.07)
Basophils Absolute: 0 10*3/uL (ref 0.0–0.1)
Basophils Relative: 0 %
Eosinophils Absolute: 0.1 10*3/uL (ref 0.0–0.5)
Eosinophils Relative: 1 %
HCT: 30.9 % — ABNORMAL LOW (ref 36.0–46.0)
Hemoglobin: 10.1 g/dL — ABNORMAL LOW (ref 12.0–15.0)
Immature Granulocytes: 0 %
Lymphocytes Relative: 8 %
Lymphs Abs: 0.8 10*3/uL (ref 0.7–4.0)
MCH: 27.4 pg (ref 26.0–34.0)
MCHC: 32.7 g/dL (ref 30.0–36.0)
MCV: 83.7 fL (ref 80.0–100.0)
Monocytes Absolute: 0.4 10*3/uL (ref 0.1–1.0)
Monocytes Relative: 5 %
Neutro Abs: 8.2 10*3/uL — ABNORMAL HIGH (ref 1.7–7.7)
Neutrophils Relative %: 86 %
Platelets: 127 10*3/uL — ABNORMAL LOW (ref 150–400)
RBC: 3.69 MIL/uL — ABNORMAL LOW (ref 3.87–5.11)
RDW: 17.4 % — ABNORMAL HIGH (ref 11.5–15.5)
WBC: 9.5 10*3/uL (ref 4.0–10.5)
nRBC: 0 % (ref 0.0–0.2)

## 2021-07-25 LAB — GLUCOSE, CAPILLARY
Glucose-Capillary: 111 mg/dL — ABNORMAL HIGH (ref 70–99)
Glucose-Capillary: 267 mg/dL — ABNORMAL HIGH (ref 70–99)
Glucose-Capillary: 95 mg/dL (ref 70–99)
Glucose-Capillary: 94 mg/dL (ref 70–99)

## 2021-07-25 LAB — AMMONIA: Ammonia: 28 umol/L (ref 9–35)

## 2021-07-25 MED ORDER — METOPROLOL TARTRATE 5 MG/5ML IV SOLN: 5.0000 mg | Freq: Once | INTRAVENOUS | Status: DC

## 2021-07-25 MED ORDER — SODIUM CHLORIDE 0.9 % IV BOLUS
500.0000 mL | Freq: Once | INTRAVENOUS | Status: AC
  Administered 2021-07-25: 500 mL via INTRAVENOUS

## 2021-07-25 MED ORDER — FUROSEMIDE 10 MG/ML IJ SOLN
10.0000 mg/h | INTRAVENOUS | Status: DC
  Administered 2021-07-25: 4 mg/h via INTRAVENOUS
  Administered 2021-07-27: 6 mg/h via INTRAVENOUS
  Filled 2021-07-25 (×4): qty 20

## 2021-07-25 MED ORDER — PANTOPRAZOLE SODIUM 40 MG IV SOLR
40.0000 mg | Freq: Two times a day (BID) | INTRAVENOUS | Status: DC
  Administered 2021-07-25 – 2021-07-30 (×9): 40 mg via INTRAVENOUS
  Filled 2021-07-25 (×9): qty 40

## 2021-07-25 MED ORDER — SODIUM CHLORIDE 0.9 % IV SOLN
250.0000 mL | INTRAVENOUS | Status: DC
  Administered 2021-07-25: 10 mL via INTRAVENOUS
  Administered 2021-07-26: 250 mL via INTRAVENOUS

## 2021-07-25 MED ORDER — INFLUENZA VAC SPLIT QUAD 0.5 ML IM SUSY
0.5000 mL | PREFILLED_SYRINGE | INTRAMUSCULAR | Status: DC
  Filled 2021-07-25: qty 0.5

## 2021-07-25 MED ORDER — AMIODARONE HCL IN DEXTROSE 360-4.14 MG/200ML-% IV SOLN
60.0000 mg/h | INTRAVENOUS | Status: DC
  Administered 2021-07-25: 60 mg/h via INTRAVENOUS
  Filled 2021-07-25 (×2): qty 200

## 2021-07-25 MED ORDER — MIDODRINE HCL 5 MG PO TABS
5.0000 mg | ORAL_TABLET | Freq: Two times a day (BID) | ORAL | Status: DC
  Administered 2021-07-25 – 2021-07-27 (×5): 5 mg via ORAL
  Filled 2021-07-25 (×7): qty 1

## 2021-07-25 MED ORDER — AMIODARONE LOAD VIA INFUSION
150.0000 mg | Freq: Once | INTRAVENOUS | Status: AC
  Administered 2021-07-25: 150 mg via INTRAVENOUS
  Filled 2021-07-25: qty 83.34

## 2021-07-25 MED ORDER — ALBUMIN HUMAN 25 % IV SOLN
25.0000 g | Freq: Three times a day (TID) | INTRAVENOUS | Status: DC
  Administered 2021-07-25 (×2): 25 g via INTRAVENOUS
  Filled 2021-07-25 (×3): qty 100

## 2021-07-25 MED ORDER — MIDODRINE HCL 5 MG PO TABS: 5.0000 mg | ORAL_TABLET | Freq: Two times a day (BID) | ORAL | Status: DC

## 2021-07-25 MED ORDER — ALBUMIN HUMAN 25 % IV SOLN
25.0000 g | Freq: Once | INTRAVENOUS | Status: AC
  Administered 2021-07-25: 25 g via INTRAVENOUS
  Filled 2021-07-25: qty 100

## 2021-07-25 MED ORDER — AMIODARONE HCL IN DEXTROSE 360-4.14 MG/200ML-% IV SOLN: 30.0000 mg/h | INTRAVENOUS | Status: DC

## 2021-07-25 MED ORDER — NOREPINEPHRINE 4 MG/250ML-% IV SOLN
2.0000 ug/min | INTRAVENOUS | Status: DC
  Filled 2021-07-25: qty 250
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
## 2021-07-25 NOTE — Progress Notes (Signed)
   07/25/21 0508  Assess: MEWS Score  Temp 97.8 F (36.6 C)  BP (!) 87/70  ECG Heart Rate (!) 153  Resp 16  Level of Consciousness Alert  SpO2 99 %  O2 Device Nasal Cannula  O2 Flow Rate (L/min) 4 L/min  Assess: MEWS Score  MEWS Temp 0  MEWS Systolic 1  MEWS Pulse 3  MEWS RR 0  MEWS LOC 0  MEWS Score 4  MEWS Score Color Red  Assess: if the MEWS score is Yellow or Red  Were vital signs taken at a resting state? Yes  Focused Assessment No change from prior assessment  Does the patient meet 2 or more of the SIRS criteria? No  Does the patient have a confirmed or suspected source of infection? No  MEWS guidelines implemented *See Row Information* No, previously red, continue vital signs every 4 hours  Treat  MEWS Interventions Administered scheduled meds/treatments  Pain Scale 0-10  Document  Patient Outcome Not stable and remains on department;Other (Comment) (patient on amio gtt, had 750 ml bolus NS and is now on albumin.  Provider at bedside and instructing to place patient on bedside monitor.  frequent vs obtained, map to remain above 60)  Assess: SIRS CRITERIA  SIRS Temperature  0  SIRS Pulse 1  SIRS Respirations  0  SIRS WBC 0  SIRS Score Sum  1

## 2021-07-25 NOTE — Progress Notes (Signed)
Jonathon Bellows , MD [address] [place], [place], [place], [address] [address], [place], [place], [place], [address] Phone: [phone]  Fax: [place] is being followed for coffee ground emesis  Subjective: Doing well , no further episodes , no abdominal pain    Objective: Vital signs in last 24 hours: Vitals:   07/25/21 0545 07/25/21 0600 07/25/21 0626 07/25/21 0932  BP: (!) 87/71 (!) 83/69 (!) 86/67 (!) 88/59  Pulse: (!) 127 (!) 134 70 66  Resp: (!) 9 13 13 10   Temp:   97.8 F (36.6 C) (!) 97.5 F (36.4 C)  TempSrc:    Oral  SpO2: 98% 100% 98% 95%  Weight:   98.8 kg   Height:       Weight change: 3.739 kg  Intake/Output Summary (Last 24 hours) at 07/25/2021 1038 Last data filed at 07/25/2021 1011 Gross per 24 hour  Intake 3166.73 ml  Output --  Net 3166.73 ml     Exam:  Abdomen: soft, nontender, normal bowel sounds   Lab Results: @LABTEST2 @ Micro Results: Recent Results (from the past 240 hour(s))  Resp Panel by RT-PCR (Flu A&B, Covid) Nasopharyngeal Swab     Status: None   Collection Time: 07/20/21  6:14 PM   Specimen: Nasopharyngeal Swab; Nasopharyngeal(NP) swabs in vial transport medium  Result Value Ref Range Status   SARS Coronavirus 2 by RT PCR NEGATIVE NEGATIVE Final    Comment: (NOTE) SARS-CoV-2 target nucleic acids are NOT DETECTED.  The SARS-CoV-2 RNA is generally detectable in upper respiratory specimens during the acute phase of infection. The lowest concentration of SARS-CoV-2 viral copies this assay can detect is 138 copies/mL. A negative result does not preclude SARS-Cov-2 infection and should not be used as the sole basis for treatment or other patient management decisions. A negative result may occur with  improper specimen collection/handling, submission of specimen other than nasopharyngeal swab, presence of viral mutation(s) within the areas targeted by this assay, and inadequate number of  viral copies(<138 copies/mL). A negative result must be combined with clinical observations, patient history, and epidemiological information. The expected result is Negative.  Fact Sheet for Patients:  EntrepreneurPulse.com.au  Fact Sheet for Healthcare Providers:  IncredibleEmployment.be  This test is no t yet approved or cleared by the [place] FDA and  has been authorized for detection and/or diagnosis of SARS-CoV-2 by FDA under an Emergency Use Authorization (EUA). This EUA will remain  in effect (meaning this test can be used) for the duration of the COVID-19 declaration under Section 564(b)(1) of the Act, 21 U.S.C.section 360bbb-3(b)(1), unless the authorization is terminated  or revoked sooner.       Influenza A by PCR NEGATIVE NEGATIVE Final   Influenza B by PCR NEGATIVE NEGATIVE Final    Comment: (NOTE) The Xpert Xpress SARS-CoV-2/FLU/RSV plus assay is intended as an aid in the diagnosis of influenza from Nasopharyngeal swab specimens and should not be used as a sole basis for treatment. Nasal washings and aspirates are unacceptable for Xpert Xpress SARS-CoV-2/FLU/RSV testing.  Fact Sheet for Patients: EntrepreneurPulse.com.au  Fact Sheet for Healthcare Providers: IncredibleEmployment.be  This test is not yet approved or cleared by the [place] FDA and has been authorized for detection and/or diagnosis of SARS-CoV-2 by FDA under an Emergency Use Authorization (EUA). This EUA will remain in effect (meaning this test can be used) for the duration of the COVID-19 declaration under Section 564(b)(1) of the Act, 21 U.S.C. section  360bbb-3(b)(1), unless the authorization is terminated or revoked.  Performed at [facility], [address] [place], [place] [address]    Studies/Results: No results found. Medications: I have reviewed the patient's current  medications. Scheduled Meds:  sodium chloride   Intravenous Once   sodium chloride   Intravenous Once   Chlorhexidine Gluconate Cloth  6 each Topical Daily   folic acid  1 mg Oral Daily   [START ON 07/26/2021] influenza vac split quadrivalent PF  0.5 mL Intramuscular Tomorrow-1000   insulin aspart  0-6 Units Subcutaneous TID WC   liver oil-zinc oxide   Topical See admin instructions   metoprolol tartrate  5 mg Intravenous Once   midodrine  5 mg Oral BID WC   [START ON 07/26/2021] pantoprazole  40 mg Intravenous Q12H   rifaximin  550 mg Oral BID   sodium chloride flush  10-40 mL Intracatheter Q12H   tiotropium  1 capsule Inhalation Daily   Continuous Infusions:  sodium chloride 10 mL (07/25/21 0525)   amiodarone 60 mg/hr (07/25/21 0818)   Followed by   amiodarone     norepinephrine (LEVOPHED) Adult infusion Stopped (07/25/21 0534)   pantoprazole 8 mg/hr (07/25/21 1013)   PRN Meds:.acetaminophen, alum & mag hydroxide-simeth, lactulose, ondansetron (ZOFRAN) IV, sodium chloride flush   Assessment: Active Problems:   Peripheral vascular disease (HCC)   Acute respiratory failure with hypoxia (HCC)   Acute combined systolic and diastolic heart failure (HCC)   Non-ST elevation (NSTEMI) myocardial infarction [facility])   Atrial fibrillation with rapid ventricular response (HCC)   Type 2 diabetes mellitus with peripheral neuropathy ([place])   Coronary artery disease involving native coronary artery of native heart without angina pectoris   Ischemic cardiomyopathy   Essential hypertension   Controlled type 2 diabetes mellitus with stage 4 chronic kidney disease, with long-term current use of insulin (HCC)   Acute on chronic systolic (congestive) heart failure (HCC)   Elevated troponin   Pressure injury of skin  CBC Latest Ref Rng & Units 07/25/2021 07/24/2021 07/23/2021  WBC 4.0 - 10.5 K/uL 9.5 9.3 10.1  Hemoglobin 12.0 - 15.0 g/dL 10.1(L) 10.0(L) 10.5(L)  Hematocrit 36.0 - 46.0 % 30.9(L) 31.0(L)  32.2(L)  Platelets 150 - 400 K/uL 127(L) 137(L) 144(L)    [name] 55 y.o. female with ischemic cardiomyopathy EF 35 %, A fibb on xarelto , admitted with acute CHF,AMS and GI following for coffee ground emesis. Patient has cirrhosis. . Normal EGD in 03/09/2021. Being treated for hepatic encephelopathy . Hb stable at 10 grams .  From cardiac point of view no other evaluation therapy required before endoscopy.   Plan: Patient has still not decided if she wants an EGD, she wants to discuss with her husband . I will speak with her tomorrow to check if she has made up her mind.     LOS: 5 days   Jonathon Bellows, MD 07/25/2021, 10:38 AM

## 2021-07-25 NOTE — Progress Notes (Signed)
Cross Cover Patient went into afib with RVR rates 170-180/ Patient denied symptoms. Gave one dose IV metoprolol resultant in BP drop but MAPs remained in high 60's low 70's.Hanley Seamen IV fluid bolus and started amiodarone infusion.  Patient converted to sinus rhythm.

## 2021-07-25 NOTE — Progress Notes (Signed)
06:21 Patient converted to sinus arrhythmia @ 72  Patient is stable, provider at bedside

## 2021-07-25 NOTE — Progress Notes (Signed)
Patient in afib rvr @ 145-170, asymptomatic, bp 95/82, practitioner notified. ECG completed

## 2021-07-25 NOTE — Progress Notes (Addendum)
   07/25/21 0312  Assess: MEWS Score  Temp 98.2 F (36.8 C)  BP 95/82  Pulse Rate (!) 155  ECG Heart Rate (!) 146  Resp 16  Level of Consciousness Alert  SpO2 90 %  O2 Device Room Air  Assess: MEWS Score  MEWS Temp 0  MEWS Systolic 1  MEWS Pulse 3  MEWS RR 0  MEWS LOC 0  MEWS Score 4  MEWS Score Color Red  Assess: if the MEWS score is Yellow or Red  Were vital signs taken at a resting state? Yes  Focused Assessment No change from prior assessment  Does the patient meet 2 or more of the SIRS criteria? No  Does the patient have a confirmed or suspected source of infection? No  MEWS guidelines implemented *See Row Information* Yes  Treat  MEWS Interventions Escalated (See documentation below);Administered scheduled meds/treatments  Pain Score 0  Take Vital Signs  Increase Vital Sign Frequency  Red: Q 1hr X 4 then Q 4hr X 4, if remains red, continue Q 4hrs  Escalate  MEWS: Escalate Red: discuss with charge nurse/RN and provider, consider discussing with RRT  Notify: Charge Nurse/RN  Name of Charge Nurse/RN Notified Mericar  Date Charge Nurse/RN Notified 07/25/21  Time Charge Nurse/RN Notified [id number]  Notify: Provider  Provider Name/Title Randol Kern  Date Provider Notified 07/25/21  Time Provider Notified [phone]  Notification Type Page  Notification Reason Critical result  Provider response See new orders  Date of Provider Response 07/25/21  Time of Provider Response 0316  Document  Progress note created (see row info) Yes  Assess: SIRS CRITERIA  SIRS Temperature  0  SIRS Pulse 1  SIRS Respirations  0  SIRS WBC 0  SIRS Score Sum  1
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
## 2021-07-25 NOTE — Care Management Important Message (Signed)
Important Message  Patient Details  Name: [name] MRN: [id number] Date of Birth: Oct 09, 1966   Medicare Important Message Given:  Yes     Dannette Barbara 07/25/2021, 11:30 AM

## 2021-07-25 NOTE — Consult Note (Addendum)
WOC consult requested for pressure injuries.  Pt is noted to have Stage 2 pressure injuries to bilat buttocks in 3 locations; refer to nursing wound flow sheet for sites and measurements.   A consult was already performed on 9/4; refer to previous [place] team member 's note for assessment and topical treatment orders have been provided for bedside nurses to perform.  Please re-consult if further assistance is needed.  Thank-you,  Julien Girt MSN, [place], [place], [place], [place]

## 2021-07-26 DIAGNOSIS — K920 Hematemesis: Secondary | ICD-10-CM

## 2021-07-26 DIAGNOSIS — Z7189 Other specified counseling: Secondary | ICD-10-CM | POA: Diagnosis not present

## 2021-07-26 LAB — RENAL FUNCTION PANEL
Albumin: 3 g/dL — ABNORMAL LOW (ref 3.5–5.0)
Anion gap: 12 (ref 5–15)
BUN: 67 mg/dL — ABNORMAL HIGH (ref 6–20)
CO2: 21 mmol/L — ABNORMAL LOW (ref 22–32)
Calcium: 7.6 mg/dL — ABNORMAL LOW (ref 8.9–10.3)
Chloride: 97 mmol/L — ABNORMAL LOW (ref 98–111)
Creatinine, Ser: 4.24 mg/dL — ABNORMAL HIGH (ref 0.44–1.00)
GFR, Estimated: 12 mL/min — ABNORMAL LOW (ref >60)
Glucose, Bld: 129 mg/dL — ABNORMAL HIGH (ref 70–99)
Phosphorus: 7.7 mg/dL — ABNORMAL HIGH (ref 2.5–4.6)
Potassium: 4.1 mmol/L (ref 3.5–5.1)
Sodium: 130 mmol/L — ABNORMAL LOW (ref 135–145)

## 2021-07-26 LAB — GLUCOSE, CAPILLARY
Glucose-Capillary: 79 mg/dL (ref 70–99)
Glucose-Capillary: 132 mg/dL — ABNORMAL HIGH (ref 70–99)
Glucose-Capillary: 111 mg/dL — ABNORMAL HIGH (ref 70–99)
Glucose-Capillary: 110 mg/dL — ABNORMAL HIGH (ref 70–99)

## 2021-07-26 MED ORDER — NITROGLYCERIN 0.4 MG SL SUBL
0.4000 mg | SUBLINGUAL_TABLET | SUBLINGUAL | Status: DC | PRN
Start: 2021-07-26 — End: 2021-07-30

## 2021-07-26 MED ORDER — SODIUM CHLORIDE 0.9 % IV SOLN: INTRAVENOUS | Status: DC

## 2021-07-26 MED ORDER — TRAMADOL HCL 50 MG PO TABS
50.0000 mg | ORAL_TABLET | Freq: Four times a day (QID) | ORAL | Status: DC | PRN
  Administered 2021-07-26 – 2021-07-29 (×8): 50 mg via ORAL
  Filled 2021-07-26 (×7): qty 1

## 2021-07-26 MED ORDER — ALBUMIN HUMAN 25 % IV SOLN
25.0000 g | Freq: Three times a day (TID) | INTRAVENOUS | Status: DC
  Administered 2021-07-26 – 2021-07-28 (×9): 25 g via INTRAVENOUS
  Filled 2021-07-26 (×9): qty 100

## 2021-07-26 NOTE — Consult Note (Signed)
Consultation Note Date: 07/26/2021   Patient Name: [name]  DOB: 04-20-1966  MRN: [id number]  Age / Sex: 55 y.o., female  PCP: Frazier Richards, MD Referring Physician: Lorella Nimrod, MD  Reason for Consultation: Establishing goals of care  HPI/Patient Profile: [name] is a 55 y.o. female with history of HFrEF, ischemic cardiomyopathy, hypertension, type 2 diabetes, CKD, PAD, A. fib, who presents with shortness of breath and chest pain.  Clinical Assessment and Goals of Care:  THIS NOTE WILL BE LOCKED PER PATIENT, AS IT CONTAINS PRIVATE INFORMATION REGARDING PATIENT'S WISHES IN REGARDS TO HER HUSBAND.   Patient is resting in bed with her sister Loel Lofty at bedside. She states she is married and has no children.   She states she lives at home with her husband. She uses a motorized wheelchair because of bilateral amputations. She states she goes to doctor's appointments if she can, but her husband has to take her. Patient tells me her husband works a large amount of time and then goes to the fire department for hours on end coming home occasionally to see if she has eaten.  We discussed her diagnoses, poor overall prognosis, GOC, EOL wishes disposition and options. She states she has consented to having an EGD tomorrow.    A detailed discussion was had today regarding advanced directives.  Concepts specific to code status, artifical feeding and hydration, IV antibiotics and rehospitalization were discussed.  The difference between an aggressive medical intervention path and a comfort care path was discussed.  Values and goals of care important to patient and family were attempted to be elicited.  Discussed limitations of medical interventions to prolong quality of life in some situations and discussed the concept of human mortality.  She voices understanding of her disease processes and  progression as well as prognosis.  She states at times she is confused due to her cirrhosis. At this time, she is alert and oriented telling me her name, that she is at [facility], the year is 2022, and the reason she is here.  She states she knows she will need help after discharge and would like to be placed in a facility near her sister long term as she states she can not go home and live with her husband.  Sister advises she has called DSS regarding patient's husband. Patient also voices that she would like [name] to be primary decision maker and her brother to be secondary. Offered chaplain service to complete HPOA papers, and patient would like to do that at this time. She states she does not want her husband to have any part in decision making. She requests that these papers be completed this morning as her husband will be coming this afternoon.   A MOST form was completed with patient as she states she would never want CPR. She would want to be placed on a ventilator for up to 3 days only. She would never want a feeding tube. The MOST form was signed by [name] at patient's  request as patient is legally blind.  I completed a MOST form today with patient and sister, and the signed original was placed in the chart. A photocopy was placed in the chart to be scanned into EMR. The patient outlined their wishes for the following treatment decisions:  Cardiopulmonary Resuscitation: Do Not Attempt Resuscitation (DNR/No CPR)  Medical Interventions: Full Scope of Treatment: Use intubation, advanced airway interventions, mechanical ventilation, cardioversion as indicated, medical treatment, IV fluids, etc, also provide comfort measures. Transfer to the hospital if indicated Ventilator support for 3 days only.  Antibiotics: Antibiotics if indicated  IV Fluids: IV fluids if indicated  Feeding Tube: No feeding tube     SUMMARY OF RECOMMENDATIONS   --Per patient request, chaplain was called to complete HPOA papers  for sister [name] to be primary HPOA, and brother to be secondary. Chaplain at bedside upon my departure.  --She states she cannot discharge home and wants to go long term to a nursing facility near her sister. TOC made aware.   THIS NOTE WILL BE LOCKED PER PATIENT, AS IT CONTAINS PRIVATE INFORMATION REGARDING PATIENT'S WISHES IN REGARDS TO HER HUSBAND.    Prognosis:  Poor overall        Primary Diagnoses: Present on Admission:  Acute on chronic systolic (congestive) heart failure (HCC)  Peripheral vascular disease (HCC)  Acute respiratory failure with hypoxia (HCC)  Acute combined systolic and diastolic heart failure (HCC)  Atrial fibrillation with rapid ventricular response (HCC)  Type 2 diabetes mellitus with peripheral neuropathy ([place])  Ischemic cardiomyopathy  Essential hypertension  Coronary artery disease involving native coronary artery of native heart without angina pectoris  Non-ST elevation (NSTEMI) myocardial infarction [facility])   I have reviewed the medical record, interviewed the patient and family, and examined the patient. The following aspects are pertinent.  Past Medical History:  Diagnosis Date   Abnormal stress test    a. 02/2017 MV: large region of fixed perfusion defect in basal to mid inf, mid-dist inflat walls, EF 43%. No ischemia (EF 55-60% by f/u echo).   Arthritis    Asthma    Carotid arterial disease ([place])    a. 09/2017 Carotid U/S: 40-49% bilat ICA stenosis.   Chronic back pain    Coronary artery calcification seen on CT scan    a. 11/2017 CT Abd/Pelvis: Multi vessel coronary vascular Ca2+.   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 03/2017 Echo: EF 55-60%,  mild LVH, nl RV fxn.   Hyperlipidemia    Ischemic cardiomyopathy    a. 04/2018 Echo: EF 30-35%, Gr2 DD, mild LVH. Mild MR. Mildly dil LA, mod dil RV w/ mod red RV fxn, mild TR, PASP 15mmHg.   NSTEMI (non-ST elevated myocardial infarction) ([place])    a. 05/2018 in setting of Afib, sepsis, and post-op L AKA. Peak trop 7.7. EF 30-35% by echo-->cath not performed 2/2 renal failure.   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 S/p L fem-pop bypass; b. 11/2017 s/p Aortobifem bypass 2/2 graft occlusion; c. 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   Paroxysmal atrial fibrillation  with rapid ventricular response ([place]) 12/02/2017   a. CHA2DS2VASc = 3-->Xarelto; b. 05/2018 Recurrent Afib-->amio.   Renal insufficiency    Ulcer    Foot   Social History   Socioeconomic History   Marital status: Married    Spouse name: Not on file   Number of children: Not on file   Years of education: Not on file   Highest education level: Not on file  Occupational History   Occupation: disabled  Tobacco Use   Smoking status: Every Day    Packs/day: 0.50    Years: 30.00    Pack years: 15.00    Types: Cigarettes   Smokeless tobacco: Never  Vaping Use   Vaping Use: Never used  Substance and Sexual Activity   Alcohol use: No    Alcohol/week: 0.0 standard drinks   Drug use: No   Sexual activity: Yes    Birth control/protection: Post-menopausal  Other Topics Concern   Not on file  Social History Narrative   Not on file   Social Determinants of Health   Financial Resource Strain: Not on file  Food Insecurity: Not on file  Transportation Needs: Not on file  Physical Activity: Not on file  Stress: Not on file  Social Connections: Not on file   Family History  Problem Relation Age of Onset   Coronary artery disease Mother    Peripheral vascular disease Mother    Heart disease Mother        Before age 51   Other Mother         Venous insuffiency   Diabetes Mother    Hyperlipidemia Mother    Hypertension Mother    Varicose Veins Mother    Heart attack Mother        before age 81   Heart disease Father    Diabetes Father    Diabetes Maternal Grandmother    Diabetes Paternal Grandmother    Diabetes Paternal Grandfather    Diabetes Sister    Hypertension Sister    Diabetes Brother    Hypertension Brother    Scheduled Meds:  Chlorhexidine Gluconate Cloth  6 each Topical Daily   folic acid  1 mg Oral Daily   influenza vac split quadrivalent PF  0.5 mL Intramuscular Tomorrow-1000   insulin aspart  0-6 Units Subcutaneous TID WC   liver oil-zinc oxide   Topical See admin instructions   metoprolol tartrate  5 mg Intravenous Once   midodrine  5 mg Oral BID WC   pantoprazole (PROTONIX) IV  40 mg Intravenous Q12H   rifaximin  550 mg Oral BID   sodium chloride flush  10-40 mL Intracatheter Q12H   tiotropium  1 capsule Inhalation Daily   Continuous Infusions:  sodium chloride 250 mL (07/26/21 0737)   sodium chloride 10 mL/hr at 07/26/21 0928   albumin human 25 g (07/26/21 0926)   furosemide (LASIX) 200 mg in dextrose 5% 100 mL (2mg /mL) infusion 6 mg/hr (07/26/21 1128)   PRN Meds:.acetaminophen, alum & mag hydroxide-simeth, lactulose, nitroGLYCERIN, ondansetron (ZOFRAN) IV, sodium chloride flush, traMADol Medications Prior to Admission:  Prior to Admission medications   Medication Sig Start Date End Date Taking? Authorizing Provider  amitriptyline (ELAVIL) 25 MG tablet Take 1 tablet (25 mg total) by mouth at bedtime. 12/05/20  Yes Gerlene Fee, NP  diphenoxylate-atropine (LOMOTIL) 2.5-0.025 MG tablet Take 1 tablet by mouth 3 (three) times daily. 04/19/21  Yes Aliene Altes S, PA-C  insulin glargine (LANTUS) 100 UNIT/ML injection Inject  10-15 Units into the skin daily.   Yes [provider]  insulin lispro (HUMALOG KWIKPEN) 100 UNIT/ML KwikPen Inject 4-10 Units into the skin 3 (three) times daily  with meals. If blood sugar 121-150, 2 units If blood sugar 151-200, 4 units If blood sugar 201-250, 6 units If blood sugar 251-300, 8 units If blood sugar 301-500, 10 units If blood sugar greater than 350 call MD 12/05/20  Yes Gerlene Fee, NP  lipase/protease/amylase (CREON) 36000 UNITS CPEP capsule Take 3 capsules (108,000 Units total) by mouth 3 (three) times daily with meals AND 2 capsules (72,000 Units total) with snacks. 04/19/21  Yes Aliene Altes S, PA-C  metoprolol tartrate (LOPRESSOR) 25 MG tablet Take 0.5 tablets (12.5 mg total) by mouth 2 (two) times daily. 12/05/20  Yes Gerlene Fee, NP  omeprazole (PRILOSEC) 40 MG capsule Take 1 capsule (40 mg total) by mouth daily. 12/05/20  Yes Gerlene Fee, NP  oxyCODONE-acetaminophen (PERCOCET) 5-325 MG tablet Take 1 tablet by mouth every 6 (six) hours as needed for severe pain. 07/16/21  Yes Mesner, Corene Cornea, MD  pregabalin (LYRICA) 100 MG capsule Take 1 capsule (100 mg total) by mouth 2 (two) times daily. 03/16/21  Yes Kathie Dike, MD  Rivaroxaban (XARELTO) 15 MG TABS tablet Take 1 tablet (15 mg total) by mouth daily with supper. 12/05/20  Yes Gerlene Fee, NP  sodium bicarbonate 650 MG tablet Take 1 tablet (650 mg total) by mouth 2 (two) times daily. 12/05/20  Yes Gerlene Fee, NP  SPIRIVA HANDIHALER 18 MCG inhalation capsule Place 1 capsule into inhaler and inhale daily. 06/01/21  Yes [provider]  torsemide (DEMADEX) 20 MG tablet Take 1 tablet (20 mg total) by mouth daily. 12/05/20  Yes Gerlene Fee, NP  albuterol (PROVENTIL) 2 MG tablet Take 1 tablet (2 mg total) by mouth daily as needed for shortness of breath. Patient not taking: No sig reported 12/05/20   Gerlene Fee, NP  amiodarone (PACERONE) 200 MG tablet Take 1 tablet (200 mg total) by mouth See admin instructions. oral amiodarone 400mg  bid x 7 days, then 200mg  bid x 2 weeks, then 200mg  daily after that Patient not taking: No sig reported 12/05/20   Gerlene Fee, NP  Balsam [place]-Castor Oil [facility]) OINT Apply 1 application topically 3 (three) times daily. Every shift to bilateral buttocks, coccyx, and sacrum    [provider]  cholestyramine light (PREVALITE) 4 g packet Take 1 packet (4 g total) by mouth 2 (two) times daily. Take either 2 hours before or 2 hours after usual medications are taken. 04/02/21 05/02/21  Manuella Ghazi, Pratik D, DO  Choline Fenofibrate (FENOFIBRIC ACID) 45 MG CPDR Take 1 capsule by mouth daily. Patient not taking: No sig reported 01/03/21   [provider]  dicyclomine (BENTYL) 10 MG capsule Take 1 capsule (10 mg total) by mouth every 6 (six) hours as needed for spasms. Patient not taking: No sig reported 12/05/20   Gerlene Fee, NP  Insulin Syringe-Needle U-100 (INSULIN SYRINGE 1CC/31GX5/16") 31G X 5/16" 1 ML MISC For use with administration of octreotide twice daily. 04/02/21   Manuella Ghazi, Pratik D, DO  naloxone [facility]) nasal spray 4 mg/0.1 mL Place 1 spray into the nose once as needed (opioid overdose). 12/05/20   Gerlene Fee, NP  nicotine (NICODERM CQ - DOSED IN MG/24 HOURS) 21 mg/24hr patch Place 21 mg onto the skin daily. Rotate sites and remove old patch prior to application  [provider]  Potassium Chloride ER 20 MEQ TBCR Take 20 mEq by mouth daily. 1 tab daily by mouth 12/05/20   Gerlene Fee, NP  PROAIR HFA 108 [phone] Base) MCG/ACT inhaler Inhale 1-2 puffs into the lungs every 6 (six) hours as needed for shortness of breath or wheezing. 06/01/21   [provider]  rosuvastatin (CRESTOR) 20 MG tablet Take 1 tablet (20 mg total) by mouth daily at 6 PM. Patient not taking: No sig reported 12/05/20   Gerlene Fee, NP  vitamin B-12 1000 MCG tablet Take 1 tablet (1,000 mcg total) by mouth daily. Patient not taking: No sig reported 10/12/20   Pahwani, Michell Heinrich, MD   Allergies  Allergen Reactions   Lactose Intolerance (Gi) Diarrhea   Review of Systems  Gastrointestinal:  Positive  for abdominal distention.   Physical Exam Pulmonary:     Effort: Pulmonary effort is normal.  Neurological:     Mental Status: She is alert.    Vital Signs: BP (!) 108/58   Pulse 72   Temp 97.6 F (36.4 C) (Oral)   Resp (!) 9   Ht 5\' 7"  (1.702 m)   Wt 98.8 kg   SpO2 96%   BMI 34.13 kg/m  Pain Scale: 0-10 POSS *See Group Information*: 1-Acceptable,Awake and alert Pain Score: 3    SpO2: SpO2: 96 % O2 Device:SpO2: 96 % O2 Flow Rate: .O2 Flow Rate (L/min): 2 L/min  IO: Intake/output summary:  Intake/Output Summary (Last 24 hours) at 07/26/2021 1255 Last data filed at 07/26/2021 1017 Gross per 24 hour  Intake 1487.95 ml  Output 250 ml  Net 1237.95 ml    LBM: Last BM Date: 07/24/21 Baseline Weight: Weight: 74.8 kg Most recent weight: Weight: 98.8 kg        Time In: 10:05 Time Out: 11:00 Time Total: 55 min Greater than 50%  of this time was spent counseling and coordinating care related to the above assessment and plan.  Signed by: Asencion Gowda, NP   Please contact Palliative Medicine Team phone at [phone] for questions and concerns.  For individual provider: See Shea Evans

## 2021-07-26 NOTE — Progress Notes (Signed)
Notified practitioner of the ongoing low RR of 7-10 throughout the night.  No new orders received and patient is easily aroused by voice.

## 2021-07-26 NOTE — Progress Notes (Signed)
[name] , MD [address] [place], [place], [place], [address] [address], [place], [place], [place], [address] Phone: [phone]  Fax: [place] is being followed for GI bleed   Subjective:   Doing better, no complaints , able to lay flat   Objective: Vital signs in last 24 hours: Vitals:   07/26/21 0330 07/26/21 0543 07/26/21 0700 07/26/21 0808  BP: (!) 89/51 (!) 108/59 (!) 102/57   Pulse: 64 70 71   Resp: 10 10 11    Temp: 97.6 F (36.4 C) 97.8 F (36.6 C) (!) 97.5 F (36.4 C) 97.6 F (36.4 C)  TempSrc: Axillary Oral Axillary Oral  SpO2: 99% 98% 98%   Weight:      Height:       Weight change:   Intake/Output Summary (Last 24 hours) at 07/26/2021 6283 Last data filed at 07/26/2021 1517 Gross per 24 hour  Intake 1507.5 ml  Output 1450 ml  Net 57.5 ml     Exam:  Abdomen: soft, nontender, normal bowel sounds   Lab Results: @LABTEST2 @ Micro Results: Recent Results (from the past 240 hour(s))  Resp Panel by RT-PCR (Flu A&B, Covid) Nasopharyngeal Swab     Status: None   Collection Time: 07/20/21  6:14 PM   Specimen: Nasopharyngeal Swab; Nasopharyngeal(NP) swabs in vial transport medium  Result Value Ref Range Status   SARS Coronavirus 2 by RT PCR NEGATIVE NEGATIVE Final    Comment: (NOTE) SARS-CoV-2 target nucleic acids are NOT DETECTED.  The SARS-CoV-2 RNA is generally detectable in upper respiratory specimens during the acute phase of infection. The lowest concentration of SARS-CoV-2 viral copies this assay can detect is 138 copies/mL. A negative result does not preclude SARS-Cov-2 infection and should not be used as the sole basis for treatment or other patient management decisions. A negative result may occur with  improper specimen collection/handling, submission of specimen other than nasopharyngeal swab, presence of viral mutation(s) within the areas targeted by this assay, and inadequate number of viral copies(<138  copies/mL). A negative result must be combined with clinical observations, patient history, and epidemiological information. The expected result is Negative.  Fact Sheet for Patients:  EntrepreneurPulse.com.au  Fact Sheet for Healthcare Providers:  IncredibleEmployment.be  This test is no t yet approved or cleared by the [place] FDA and  has been authorized for detection and/or diagnosis of SARS-CoV-2 by FDA under an Emergency Use Authorization (EUA). This EUA will remain  in effect (meaning this test can be used) for the duration of the COVID-19 declaration under Section 564(b)(1) of the Act, 21 U.S.C.section 360bbb-3(b)(1), unless the authorization is terminated  or revoked sooner.       Influenza A by PCR NEGATIVE NEGATIVE Final   Influenza B by PCR NEGATIVE NEGATIVE Final    Comment: (NOTE) The Xpert Xpress SARS-CoV-2/FLU/RSV plus assay is intended as an aid in the diagnosis of influenza from Nasopharyngeal swab specimens and should not be used as a sole basis for treatment. Nasal washings and aspirates are unacceptable for Xpert Xpress SARS-CoV-2/FLU/RSV testing.  Fact Sheet for Patients: EntrepreneurPulse.com.au  Fact Sheet for Healthcare Providers: IncredibleEmployment.be  This test is not yet approved or cleared by the [place] FDA and has been authorized for detection and/or diagnosis of SARS-CoV-2 by FDA under an Emergency Use Authorization (EUA). This EUA will remain in effect (meaning this test can be used) for the duration of the COVID-19 declaration under Section 564(b)(1) of the Act, 21 U.S.C.  section 360bbb-3(b)(1), unless the authorization is terminated or revoked.  Performed at [facility], [address], [place], [place] [address]    Studies/Results: No results found. Medications: I have reviewed the patient's current medications. Scheduled Meds:   Chlorhexidine Gluconate Cloth  6 each Topical Daily   folic acid  1 mg Oral Daily   influenza vac split quadrivalent PF  0.5 mL Intramuscular Tomorrow-1000   insulin aspart  0-6 Units Subcutaneous TID WC   liver oil-zinc oxide   Topical See admin instructions   metoprolol tartrate  5 mg Intravenous Once   midodrine  5 mg Oral BID WC   pantoprazole (PROTONIX) IV  40 mg Intravenous Q12H   rifaximin  550 mg Oral BID   sodium chloride flush  10-40 mL Intracatheter Q12H   tiotropium  1 capsule Inhalation Daily   Continuous Infusions:  sodium chloride 250 mL (07/26/21 0737)   sodium chloride 10 mL/hr at 07/26/21 0928   albumin human 25 g (07/26/21 0926)   furosemide (LASIX) 200 mg in dextrose 5% 100 mL (2mg /mL) infusion 4 mg/hr (07/25/21 1851)   PRN Meds:.acetaminophen, alum & mag hydroxide-simeth, lactulose, nitroGLYCERIN, ondansetron (ZOFRAN) IV, sodium chloride flush, traMADol   Assessment: Active Problems:   Peripheral vascular disease ([place])   Acute kidney injury ([place])   Acute respiratory failure with hypoxia (HCC)   Acute combined systolic and diastolic heart failure (HCC)   Non-ST elevation (NSTEMI) myocardial infarction [facility])   Atrial fibrillation with rapid ventricular response (HCC)   Type 2 diabetes mellitus with peripheral neuropathy ([place])   Coronary artery disease involving native coronary artery of native heart without angina pectoris   Ischemic cardiomyopathy   Essential hypertension   Controlled type 2 diabetes mellitus with stage 4 chronic kidney disease, with long-term current use of insulin (HCC)   Acute on chronic systolic (congestive) heart failure (HCC)   Elevated troponin   Pressure injury of skin   Acute pulmonary edema (HCC)   Closed fracture of rib of right side with routine healing  [name] 55 y.o. female with ischemic cardiomyopathy EF 35 %, A fibb on xarelto , admitted with acute CHF,AMS and GI following for coffee ground emesis. Patient has  cirrhosis. . Normal EGD in 03/09/2021. Being treated for hepatic encephelopathy . Hb stable at 10 grams .  From cardiac point of view no other evaluation therapy required before endoscopy.   Plan: Patient has agreed to proceed with EGD tomorrow . NPO from midnight  I have discussed alternative options, risks & benefits,  which include, but are not limited to, bleeding, infection, perforation,respiratory complication & drug reaction.  The patient agrees with this plan & written consent will be obtained.      LOS: 6 days   Jamie Bellows, MD 07/26/2021, 9:52 AM

## 2021-07-26 NOTE — Progress Notes (Signed)
[facility]  ROUNDING NOTE   Subjective:   [name] is a 55 y.o.  female with a past medical history of hypertension, Type 2 diabetes, atrial fib, ischemic myopathy and chronic kidney disease. She has been admitted for Acute pulmonary edema (HCC) [J81.0] Elevated troponin [R77.8] Acute renal failure (ARF) (HCC) [N17.9] Acute kidney injury ([place]) [N17.9] Acute on chronic systolic (congestive) heart failure (HCC) [I50.23] Closed fracture of one rib of right side with routine healing, subsequent encounter [S22.31XD]  Patient seen resting in bed Sister at bedside Appetite improving Denies nausea and vomiting Remains on [place] O2 Feels minimally improved    Objective:  Vital signs in last 24 hours:  Temp:  [97.5 F (36.4 C)-97.8 F (36.6 C)] 97.6 F (36.4 C) (09/08 1119) Pulse Rate:  [61-72] 72 (09/08 1000) Resp:  [7-20] 9 (09/08 1000) BP: (89-108)/(51-63) 108/58 (09/08 1000) SpO2:  [96 %-99 %] 96 % (09/08 1000)  Weight change:  Filed Weights   07/23/21 0500 07/24/21 0500 07/25/21 0626  Weight: 97.3 kg 95.1 kg 98.8 kg    Intake/Output: I/O last 3 completed shifts: In: 3915.3 [P.O.:1060; I.V.:1158.1; IV Piggyback:1697.3] Out: 1550 [Urine:1550]   Intake/Output this shift:  Total I/O In: 548.9 [P.O.:480; I.V.:18.9; IV Piggyback:50] Out: -   Physical Exam: General: NAD, laying in bed  Head: Normocephalic, atraumatic. Moist oral mucosal membranes  Eyes: Anicteric, legally blind  Lungs:  Clear to auscultation, [place] O2  Heart: Regular rate and rhythm  Abdomen:  firm, nontender, distended  Extremities:  2+ peripheral edema.  Neurologic: Nonfocal, moving all four extremities  Skin: No lesions       Basic Metabolic Panel: Recent Labs  Lab 07/22/21 0435 07/23/21 0621 07/23/21 2133 07/24/21 0419 07/25/21 0507 07/26/21 0859  NA 135 134*  --  135 133* 130*  K 4.5 4.4  --  4.2 4.2 4.1  CL 103 104  --  103 102 97*  CO2 23 21*  --  23 21* 21*  GLUCOSE  79 85  --  80 99 129*  BUN 57* 64*  --  63* 66* 67*  CREATININE 4.15* 3.98*  --  3.86* 3.71* 4.24*  CALCIUM 7.2* 7.4*  --  7.7* 7.6* 7.6*  MG  --  1.6* 1.7 2.1  --   --   PHOS  --  8.6*  --  8.3*  --  7.7*    Liver Function Tests: Recent Labs  Lab 07/22/21 0435 07/23/21 0621 07/26/21 0859  AST 10* 14*  --   ALT 8 8  --   ALKPHOS 80 71  --   BILITOT 0.6 1.0  --   PROT 5.8* 5.9*  --   ALBUMIN 2.7* 2.7* 3.0*   No results for input(s): LIPASE, AMYLASE in the last 168 hours. Recent Labs  Lab 07/25/21 0648  AMMONIA 28    CBC: Recent Labs  Lab 07/22/21 0435 07/23/21 0621 07/23/21 2133 07/24/21 0419 07/25/21 0507  WBC 5.7 11.1* 10.1 9.3 9.5  NEUTROABS  --   --   --   --  8.2*  HGB 7.3* 8.8* 10.5* 10.0* 10.1*  HCT 23.8* 28.7* 32.2* 31.0* 30.9*  MCV 87.8 85.2 84.1 83.1 83.7  PLT 173 175 144* 137* 127*    Cardiac Enzymes: No results for input(s): CKTOTAL, CKMB, CKMBINDEX, TROPONINI in the last 168 hours.  BNP: Invalid input(s): POCBNP  CBG: Recent Labs  Lab 07/25/21 1234 07/25/21 1731 07/25/21 2104 07/26/21 0738 07/26/21 1122  GLUCAP 267* 95 94  79 132*    Microbiology: Results for orders placed or performed during the hospital encounter of 07/20/21  Resp Panel by RT-PCR (Flu A&B, Covid) Nasopharyngeal Swab     Status: None   Collection Time: 07/20/21  6:14 PM   Specimen: Nasopharyngeal Swab; Nasopharyngeal(NP) swabs in vial transport medium  Result Value Ref Range Status   SARS Coronavirus 2 by RT PCR NEGATIVE NEGATIVE Final    Comment: (NOTE) SARS-CoV-2 target nucleic acids are NOT DETECTED.  The SARS-CoV-2 RNA is generally detectable in upper respiratory specimens during the acute phase of infection. The lowest concentration of SARS-CoV-2 viral copies this assay can detect is 138 copies/mL. A negative result does not preclude SARS-Cov-2 infection and should not be used as the sole basis for treatment or other patient management decisions. A negative  result may occur with  improper specimen collection/handling, submission of specimen other than nasopharyngeal swab, presence of viral mutation(s) within the areas targeted by this assay, and inadequate number of viral copies(<138 copies/mL). A negative result must be combined with clinical observations, patient history, and epidemiological information. The expected result is Negative.  Fact Sheet for Patients:  EntrepreneurPulse.com.au  Fact Sheet for Healthcare Providers:  IncredibleEmployment.be  This test is no t yet approved or cleared by the [place] FDA and  has been authorized for detection and/or diagnosis of SARS-CoV-2 by FDA under an Emergency Use Authorization (EUA). This EUA will remain  in effect (meaning this test can be used) for the duration of the COVID-19 declaration under Section 564(b)(1) of the Act, 21 U.S.C.section 360bbb-3(b)(1), unless the authorization is terminated  or revoked sooner.       Influenza A by PCR NEGATIVE NEGATIVE Final   Influenza B by PCR NEGATIVE NEGATIVE Final    Comment: (NOTE) The Xpert Xpress SARS-CoV-2/FLU/RSV plus assay is intended as an aid in the diagnosis of influenza from Nasopharyngeal swab specimens and should not be used as a sole basis for treatment. Nasal washings and aspirates are unacceptable for Xpert Xpress SARS-CoV-2/FLU/RSV testing.  Fact Sheet for Patients: EntrepreneurPulse.com.au  Fact Sheet for Healthcare Providers: IncredibleEmployment.be  This test is not yet approved or cleared by the [place] FDA and has been authorized for detection and/or diagnosis of SARS-CoV-2 by FDA under an Emergency Use Authorization (EUA). This EUA will remain in effect (meaning this test can be used) for the duration of the COVID-19 declaration under Section 564(b)(1) of the Act, 21 U.S.C. section 360bbb-3(b)(1), unless the authorization is  terminated or revoked.  Performed at [facility], [address] [place], [place] [address]     Coagulation Studies: No results for input(s): LABPROT, INR in the last 72 hours.  Urinalysis: No results for input(s): COLORURINE, LABSPEC, PHURINE, GLUCOSEU, HGBUR, BILIRUBINUR, KETONESUR, PROTEINUR, UROBILINOGEN, NITRITE, LEUKOCYTESUR in the last 72 hours.  Invalid input(s): APPERANCEUR    Imaging: No results found.   Medications:    sodium chloride 250 mL (07/26/21 0737)   sodium chloride 10 mL/hr at 07/26/21 0928   albumin human 25 g (07/26/21 0926)   furosemide (LASIX) 200 mg in dextrose 5% 100 mL (2mg /mL) infusion 6 mg/hr (07/26/21 1128)    Chlorhexidine Gluconate Cloth  6 each Topical Daily   folic acid  1 mg Oral Daily   influenza vac split quadrivalent PF  0.5 mL Intramuscular Tomorrow-1000   insulin aspart  0-6 Units Subcutaneous TID WC   liver oil-zinc oxide   Topical See admin instructions   metoprolol tartrate  5 mg Intravenous  Once   midodrine  5 mg Oral BID WC   pantoprazole (PROTONIX) IV  40 mg Intravenous Q12H   rifaximin  550 mg Oral BID   sodium chloride flush  10-40 mL Intracatheter Q12H   tiotropium  1 capsule Inhalation Daily   acetaminophen, alum & mag hydroxide-simeth, lactulose, nitroGLYCERIN, ondansetron (ZOFRAN) IV, sodium chloride flush, traMADol  Assessment/ Plan:  [name] is a 55 y.o.  female with a past medical history of hypertension, Type 2 diabetes, atrial fib, ischemic myopathy and chronic kidney disease. She has been admitted with Acute pulmonary edema (HCC) [J81.0] Elevated troponin [R77.8] Acute renal failure (ARF) (HCC) [N17.9] Acute kidney injury ([place]) [N17.9] Acute on chronic systolic (congestive) heart failure (HCC) [I50.23] Closed fracture of one rib of right side with routine healing, subsequent encounter [S22.31XD]   Acute kidney injury with possible hepatorenal syndrome secondary  to liver cirrhosis and  chronic systolic heart failure. EF 25-30%. Renal ultrasound negative for hydronephrosis. Fluid overloaded. UOP 1529ml in last 24 hours. Will increase to Lasix 6mg /hr and continue Albumin 25g three times a day. No acute need for dialysis, but patient agreeable to dialysis if needed. Due to hypotension, dialysis would be difficult.    Lab Results  Component Value Date   CREATININE 4.24 (H) 07/26/2021   CREATININE 3.71 (H) 07/25/2021   CREATININE 3.86 (H) 07/24/2021    Intake/Output Summary (Last 24 hours) at 07/26/2021 1214 Last data filed at 07/26/2021 1017 Gross per 24 hour  Intake 1487.95 ml  Output 250 ml  Net 1237.95 ml   2. Hypotension BP soft 108/58 Midodrine 5mg  ordered, will monitor closely during diuretic therapy    LOS: 6   9/8/202212:14 PM
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
## 2021-07-27 ENCOUNTER — Encounter
Admission: EM | Disposition: A | Discharge status: Hospice | Payer: Self-pay | Source: Home / Self Care | Attending: Internal Medicine

## 2021-07-27 ENCOUNTER — Inpatient Hospital Stay: Payer: Medicare Other | Admitting: Anesthesiology

## 2021-07-27 ENCOUNTER — Encounter: Payer: Self-pay | Admitting: Internal Medicine

## 2021-07-27 DIAGNOSIS — Z7189 Other specified counseling: Secondary | ICD-10-CM | POA: Diagnosis not present

## 2021-07-27 DIAGNOSIS — K920 Hematemesis: Secondary | ICD-10-CM | POA: Diagnosis not present

## 2021-07-27 HISTORY — PX: ESOPHAGOGASTRODUODENOSCOPY (EGD) WITH PROPOFOL: SHX5813

## 2021-07-27 LAB — RENAL FUNCTION PANEL
Albumin: 3.4 g/dL — ABNORMAL LOW (ref 3.5–5.0)
Anion gap: 9 (ref 5–15)
BUN: 71 mg/dL — ABNORMAL HIGH (ref 6–20)
CO2: 22 mmol/L (ref 22–32)
Calcium: 7.7 mg/dL — ABNORMAL LOW (ref 8.9–10.3)
Chloride: 100 mmol/L (ref 98–111)
Creatinine, Ser: 3.97 mg/dL — ABNORMAL HIGH (ref 0.44–1.00)
GFR, Estimated: 13 mL/min — ABNORMAL LOW (ref >60)
Glucose, Bld: 104 mg/dL — ABNORMAL HIGH (ref 70–99)
Phosphorus: 7.6 mg/dL — ABNORMAL HIGH (ref 2.5–4.6)
Potassium: 4.3 mmol/L (ref 3.5–5.1)
Sodium: 131 mmol/L — ABNORMAL LOW (ref 135–145)

## 2021-07-27 LAB — GLUCOSE, CAPILLARY
Glucose-Capillary: 108 mg/dL — ABNORMAL HIGH (ref 70–99)
Glucose-Capillary: 101 mg/dL — ABNORMAL HIGH (ref 70–99)
Glucose-Capillary: 89 mg/dL (ref 70–99)
Glucose-Capillary: 89 mg/dL (ref 70–99)

## 2021-07-27 SURGERY — ESOPHAGOGASTRODUODENOSCOPY (EGD) WITH PROPOFOL
Anesthesia: General

## 2021-07-27 MED ORDER — DEXMEDETOMIDINE HCL IN NACL 200 MCG/50ML IV SOLN
INTRAVENOUS | Status: AC
  Filled 2021-07-27: qty 50

## 2021-07-27 MED ORDER — SODIUM CHLORIDE 0.9 % IV SOLN: INTRAVENOUS | Status: DC

## 2021-07-27 MED ORDER — PROPOFOL 10 MG/ML IV BOLUS
INTRAVENOUS | Status: AC
  Filled 2021-07-27: qty 20

## 2021-07-27 MED ORDER — PHENYLEPHRINE HCL (PRESSORS) 10 MG/ML IV SOLN
INTRAVENOUS | Status: DC | PRN
  Administered 2021-07-27 (×3): 100 ug via INTRAVENOUS

## 2021-07-27 MED ORDER — PROPOFOL 10 MG/ML IV BOLUS
INTRAVENOUS | Status: DC | PRN
Start: 2021-07-27 — End: 2021-07-27
  Administered 2021-07-27: 70 mg via INTRAVENOUS

## 2021-07-27 MED ORDER — PROPOFOL 500 MG/50ML IV EMUL
INTRAVENOUS | Status: AC
  Filled 2021-07-27: qty 50

## 2021-07-27 NOTE — Progress Notes (Addendum)
Daily Progress Note   Patient Name: [name]       Date: 07/27/2021 DOB: 1965-12-10  Age: 55 y.o. MRN#: [id number] Attending Physician: Lorella Nimrod, MD Primary Care Physician: Frazier Richards, MD Admit Date: 07/20/2021  Reason for Consultation/Follow-up: Establishing goals of care  Subjective: Patient is resting in bed. Sister [name] is at bedside. Patient confirms the HPOA documents were completed yesterday and is grateful for this. MOST form is in the chart as well, signed by sister who is now [facility], as patient is legally blind.   Spoke with nurse who confirms an AD document is in the chart and has been completed and notarized for [name] to be primary HPOA and brother [name] to be secondary, if patient is unable to make decisions for herself.     Length of Stay: 7  Current Medications: Scheduled Meds:   Chlorhexidine Gluconate Cloth  6 each Topical Daily   folic acid  1 mg Oral Daily   influenza vac split quadrivalent PF  0.5 mL Intramuscular Tomorrow-1000   insulin aspart  0-6 Units Subcutaneous TID WC   liver oil-zinc oxide   Topical See admin instructions   metoprolol tartrate  5 mg Intravenous Once   midodrine  5 mg Oral BID WC   pantoprazole (PROTONIX) IV  40 mg Intravenous Q12H   rifaximin  550 mg Oral BID   sodium chloride flush  10-40 mL Intracatheter Q12H   tiotropium  1 capsule Inhalation Daily    Continuous Infusions:  sodium chloride Stopped (07/26/21 0928)   sodium chloride 10 mL/hr at 07/26/21 0928   sodium chloride 20 mL/hr at 07/27/21 0834   albumin human 25 g (07/27/21 0702)   furosemide (LASIX) 200 mg in dextrose 5% 100 mL (2mg /mL) infusion 6 mg/hr (07/27/21 0857)    PRN Meds: acetaminophen, alum & mag hydroxide-simeth, lactulose, nitroGLYCERIN,  ondansetron (ZOFRAN) IV, sodium chloride flush, traMADol  Physical Exam Pulmonary:     Effort: Pulmonary effort is normal.  Neurological:     Mental Status: She is alert.            Vital Signs: BP 118/71 (BP Location: Right Arm)   Pulse 78   Temp (!) 97.5 F (36.4 C)   Resp 18   Ht 5\' 7"  (1.702 m)   Wt 98  kg   SpO2 98%   BMI 33.84 kg/m  SpO2: SpO2: 98 % O2 Device: O2 Device: Nasal Cannula O2 Flow Rate: O2 Flow Rate (L/min): 2 L/min  Intake/output summary:  Intake/Output Summary (Last 24 hours) at 07/27/2021 0859 Last data filed at 07/27/2021 0540 Gross per 24 hour  Intake 951.1 ml  Output 880 ml  Net 71.1 ml   LBM: Last BM Date: 07/24/21 Baseline Weight: Weight: 74.8 kg Most recent weight: Weight: 98 kg        Patient Active Problem List   Diagnosis Date Noted   Acute pulmonary edema (HCC)    Closed fracture of rib of right side with routine healing    Pressure injury of skin 07/22/2021   Acute on chronic systolic (congestive) heart failure ([place]) 07/20/2021   Elevated troponin 07/20/2021   Acute urinary retention 03/30/2021   CKD (chronic kidney disease) stage 3A, GFR 30-59 ml/min  03/30/2021   COVID-19 virus infection 03/29/2021   Hypokalemia due to excessive gastrointestinal loss of potassium 03/29/2021   Hypomagnesemia 03/29/2021   Diarrhea    Non-intractable vomiting    Left lower quadrant abdominal pain    Hypokalemia 03/09/2021   Electrolyte abnormality 03/08/2021   S/P bilateral above knee amputation ([place]) 03/08/2021   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 ([place]) 12/05/2020   CKD stage 4 due to type 2 diabetes mellitus ([place]) 12/05/2020   Hyperlipidemia associated with type 2 diabetes mellitus ([place]) 12/05/2020   Controlled type 2 diabetes mellitus with stage 4 chronic kidney disease, with long-term current use of insulin ([place]) 12/05/2020   S/P BKA (below knee amputation) unilateral, right ([place]) 12/05/2020   Anemia due to  stage 4 chronic kidney disease ([place]) 12/05/2020   Leukopenia 11/29/2020   Elevated brain natriuretic peptide (BNP) level 11/29/2020   Elevated d-dimer 11/29/2020   Prolonged QT interval 11/29/2020   Stable treated proliferative diabetic retinopathy of left eye determined by examination associated with type 2 diabetes mellitus ([place]) 08/14/2020   Macular pucker, left eye 02/24/2020   Follow-up examination after eye surgery 02/24/2020   Coronary artery disease involving native coronary artery of native heart without angina pectoris 11/21/2018   Ischemic cardiomyopathy 11/21/2018   Essential hypertension 11/21/2018   Labile blood pressure    Diabetic retinopathy associated with type 2 diabetes mellitus (HCC)    Hypoalbuminemia due to protein-calorie malnutrition ([place])    Type 2 diabetes mellitus with peripheral neuropathy (HCC)    Chronic combined systolic (congestive) and diastolic (congestive) heart failure (HCC)    Unilateral AKA, left (HCC)    Atrial fibrillation with rapid ventricular response (HCC)    Diabetic peripheral neuropathy (HCC)    Anemia of chronic disease    Gastroesophageal reflux disease    Non-ST elevation (NSTEMI) myocardial infarction ([place])    Acute combined systolic and diastolic heart failure ([place])    Acute respiratory failure with hypoxia (HCC)    Microcytic anemia 04/25/2018   Pulmonary nodule 04/25/2018   Gangrene of lower extremity ([place]) 04/25/2018   Femoral-popliteal bypass graft occlusion, left ([place]) 12/02/2017   Respiratory failure with hypoxia ([place]) 12/02/2017   Leukocytosis 12/02/2017   Paroxysmal atrial fibrillation with rapid ventricular response ([place]) 12/02/2017   Hyperkalemia 06/26/2017   Elevated lactic acid level    Acute kidney injury ([place]) 12/23/2016   Chronic back pain 11/27/2016   Hyperlipidemia LDL goal <70    Retinal tumor of right eye    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  Paresthesia of both hands 03/05/2016   DDD  (degenerative disc disease), lumbosacral 03/26/2015   DDD (degenerative disc disease), cervical 03/26/2015   Sacroiliac joint disease 03/26/2015   Occipital neuralgia 03/26/2015   Pain in limb-Bilateral Leg 04/25/2014   Peripheral vascular disease ([place]) 04/05/2013   Chronic total occlusion of artery of the extremities ([place]) 02/10/2012    Palliative Care Assessment & Plan    Recommendations/Plan: AD has been completed by chaplain services for primary HPOA to be [name] and secondary to be [name] in the case patient can not make decisions for herself.    Code Status:    Code Status Orders  (From admission, onward)           Start     Ordered   07/27/21 0824  Do not attempt resuscitation (DNR)  Continuous       Question Answer Comment  In the event of cardiac or respiratory ARREST Do not call a "code blue"   In the event of cardiac or respiratory ARREST Do not perform Intubation, CPR, defibrillation or ACLS   In the event of cardiac or respiratory ARREST Use medication by any route, position, wound care, and other measures to relive pain and suffering. May use oxygen, suction and manual treatment of airway obstruction as needed for comfort.   Comments MOST form in chart      07/27/21 0823           Code Status History     Date Active Date Inactive Code Status Order ID Comments User Context   07/20/2021 2247 07/27/2021 0823 Full Code [id number]  Clarnce Flock, MD ED   03/28/2021 2335 04/03/2021 0107 Full Code [id number]  Elgergawy, Silver Huguenin, MD ED   03/08/2021 1821 03/16/2021 2035 Full Code [id number]  Bethena Roys, MD Inpatient   11/29/2020 1953 12/02/2020 1938 Full Code [id number]  Bernadette Hoit, DO ED   10/06/2020 2256 10/11/2020 2338 Full Code [id number]  Kayleen Memos, DO ED   05/22/2018 1442 06/03/2018 1348 Full Code [id number]  Cathlyn Parsons, PA-C Inpatient   05/22/2018 1442 05/22/2018 1442 Full Code [id number]  Cathlyn Parsons, PA-C Inpatient   04/25/2018 0040  05/22/2018 1439 Full Code [id number]  Omar Person, NP ED   03/27/2018 1710 03/31/2018 2006 Full Code [id number]  Ulyses Amor, PA-C Inpatient   03/24/2018 1131 03/24/2018 2044 Full Code [id number]  Serafina Mitchell, MD Inpatient   11/24/2017 1811 12/09/2017 2124 Full Code [id number]  Ulyses Amor, PA-C Inpatient   10/31/2017 0956 10/31/2017 1628 Full Code [id number]  Elam Dutch, MD Inpatient   01/07/2017 1759 01/08/2017 1831 Full Code [id number]  Serafina Mitchell, MD Inpatient   12/24/2016 0250 01/01/2017 1933 Full Code [id number]  Karmen Bongo, MD Inpatient   11/27/2016 0028 11/28/2016 1839 Full Code [id number]  Etta Quill, DO ED   09/30/2016 2057 10/06/2016 1600 Full Code [id number]  Georganna Skeans, MD Inpatient   05/07/2016 1237 05/07/2016 2010 Full Code [id number]  Serafina Mitchell, MD Inpatient       Prognosis: Poor overall    Care plan was discussed with nurse, and spiritual care. Discussed scanning the AD into the ACP tab with spiritual care.   Thank you for allowing the Palliative Medicine Team to assist in the care of this patient.       Total Time 40 min Prolonged Time Billed  no       Greater than 50%  of this time was spent counseling  and coordinating care related to the above assessment and plan.  Asencion Gowda, NP  Please contact Palliative Medicine Team phone at [phone] for questions and concerns.

## 2021-07-27 NOTE — H&P (Signed)
Jonathon Bellows, MD [address], [place], [place], [place], [address] [address], [place], [place], [place], [address] Phone: [phone]  Fax: [phone]  Primary Care Physician:  Frazier Richards, MD   Pre-Procedure History & Physical: HPI:  [name] is a 55 y.o. female is here for an endoscopy    Past Medical History:  Diagnosis Date   Abnormal stress test    a. 02/2017 MV: large region of fixed perfusion defect in basal to mid inf, mid-dist inflat walls, EF 43%. No ischemia (EF 55-60% by f/u echo).   Arthritis    Asthma    Carotid arterial disease ([place])    a. 09/2017 Carotid U/S: 40-49% bilat ICA stenosis.   Chronic back pain    Coronary artery calcification seen on CT scan    a. 11/2017 CT Abd/Pelvis: Multi vessel coronary vascular Ca2+.   Depression    Diabetes mellitus    Diabetic neuropathy ([place])    Difficult intubation    DIFFICULT AIRWAY/FYI   Family history of adverse reaction to anesthesia    mother had difficlty waking    Femoral-popliteal bypass graft occlusion, left ([place]) 12/02/2017   GERD (gastroesophageal reflux disease)    History of echocardiogram    a. 03/2017 Echo: EF 55-60%, mild LVH, nl RV fxn.   Hyperlipidemia    Ischemic cardiomyopathy    a. 04/2018 Echo: EF 30-35%, Gr2 DD, mild LVH. Mild MR. Mildly dil LA, mod dil RV w/ mod red RV fxn, mild TR, PASP 98mmHg.   NSTEMI (non-ST elevated myocardial infarction) ([place])    a. 05/2018 in setting of Afib, sepsis, and post-op L AKA. Peak trop 7.7. EF 30-35% by echo-->cath not performed 2/2 renal failure.   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 S/p L fem-pop bypass; b. 11/2017 s/p Aortobifem bypass 2/2 graft occlusion; c. 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   Paroxysmal atrial fibrillation with rapid ventricular response ([place]) 12/02/2017   a. CHA2DS2VASc = 3-->Xarelto; b. 05/2018 Recurrent Afib-->amio.   Renal  insufficiency    Ulcer    Foot    Past Surgical History:  Procedure Laterality Date   ABDOMINAL AORTAGRAM  June 15, 2014   ABDOMINAL AORTAGRAM N/A 06/15/2014   Procedure: ABDOMINAL Maxcine Ham;  Surgeon: Serafina Mitchell, MD;  Location: [facility] CATH LAB;  Service: Cardiovascular;  Laterality: N/A;   ABDOMINAL AORTAGRAM N/A 11/22/2014   Procedure: ABDOMINAL Maxcine Ham;  Surgeon: Serafina Mitchell, MD;  Location: [facility] CATH LAB;  Service: Cardiovascular;  Laterality: N/A;   ABDOMINAL AORTOGRAM W/LOWER EXTREMITY N/A 01/07/2017   Procedure: Abdominal Aortogram w/Lower Extremity;  Surgeon: Serafina Mitchell, MD;  Location: [place] CV LAB;  Service: Cardiovascular;  Laterality: N/A;   ABDOMINAL AORTOGRAM W/LOWER EXTREMITY N/A 10/31/2017   Procedure: ABDOMINAL AORTOGRAM W/LOWER EXTREMITY;  Surgeon: Elam Dutch, MD;  Location: [place] CV LAB;  Service: Cardiovascular;  Laterality: N/A;   ABDOMINAL AORTOGRAM W/LOWER EXTREMITY N/A 03/24/2018   Procedure: ABDOMINAL AORTOGRAM W/LOWER EXTREMITY;  Surgeon: Serafina Mitchell, MD;  Location: [place] CV LAB;  Service: Cardiovascular;  Laterality: N/A;   AMPUTATION Left 04/26/2018   Procedure: AMPUTATION ABOVE KNEE;  Surgeon: Elam Dutch, MD;  Location: [facility] OR;  Service: Vascular;  Laterality: Left;   AORTA - BILATERAL FEMORAL ARTERY BYPASS GRAFT N/A 11/28/2017   Procedure: AORTA BIFEMORAL BYPASS USING HEMASHIELD GOLD GRAFT & REIMPLANT  IMA;  Surgeon: Serafina Mitchell, MD;  Location: [facility] OR;  Service: Vascular;  Laterality: N/A;   AORTIC ARCH ANGIOGRAPHY N/A 10/31/2017   Procedure: AORTIC ARCH ANGIOGRAPHY;  Surgeon: Elam Dutch, MD;  Location: [place] CV LAB;  Service: Cardiovascular;  Laterality: N/A;   APPLICATION OF WOUND VAC  11/28/2017   Procedure: APPLICATION OF WOUND VAC;  Surgeon: Serafina Mitchell, MD;  Location: [place];  Service: Vascular;;   APPLICATION OF WOUND VAC Left 03/27/2018   Procedure: APPLICATION OF WOUND VAC LEFT GROIN USING [place];  Surgeon: Serafina Mitchell, MD;  Location: [place];  Service: Vascular;  Laterality: Left;   ARTERIAL BYPASS SURGERY   07/05/2010   Right Common Femoral to below knee popliteal BPG   BACK SURGERY     X's  2   BIOPSY  03/13/2021   Procedure: BIOPSY;  Surgeon: Harvel Quale, MD;  [facility];  Service: Gastroenterology;;   CARDIAC CATHETERIZATION     CHOLECYSTECTOMY     Gall Bladder   COLONOSCOPY WITH PROPOFOL N/A 03/13/2021   Procedure: COLONOSCOPY WITH PROPOFOL;  Surgeon: Harvel Quale, MD;  [facility];  Service: Gastroenterology;  Laterality: N/A;  with random colonic biopsies   CYSTECTOMY Left    wrist   EMBOLECTOMY Left 11/28/2017   Procedure: Left Lower Extremity Embolectomy, Left Tibial Peroneal Trunk Endarterectomy with Patch Angioplasty; Vein Harvest Small Saphenous Graft Left Lower Leg;  Surgeon: [name], MD;  Location: [place];  Service: Vascular;  Laterality: Left;   ESOPHAGOGASTRODUODENOSCOPY (EGD) WITH PROPOFOL N/A 03/13/2021   Procedure: ESOPHAGOGASTRODUODENOSCOPY (EGD) WITH PROPOFOL;  Surgeon: Harvel Quale, MD;  [facility];  Service: Gastroenterology;  Laterality: N/A;   EYE SURGERY Left 02/23/2020   Dr. Zadie Rhine   FEMORAL-POPLITEAL BYPASS GRAFT Left 03/27/2018   Procedure: THROMBECTOMY OF LEFT FEMORAL TIBIAL BYPASS;  Surgeon: Serafina Mitchell, MD;  Location: [facility] OR;  Service: Vascular;  Laterality: Left;   FEMORAL-TIBIAL BYPASS GRAFT Left 03/27/2018   Procedure: BYPASS GRAFT FEMORAL-TIBIAL ARTERY LEFT REDO USING CRYOPRESERVED SAPHENOUS VEIN 70cm;  Surgeon: Serafina Mitchell, MD;  Location: [facility] OR;  Service: Vascular;  Laterality: Left;   INTERCOSTAL NERVE BLOCK  November 2015   INTRAOPERATIVE ARTERIOGRAM  11/28/2017   Procedure: INTRA OPERATIVE ARTERIOGRAM OF LEFT LEG;  Surgeon: Serafina Mitchell, MD;  Location: [place];  Service: Vascular;;   INTRAOPERATIVE ARTERIOGRAM Left 03/27/2018    Procedure: INTRA OPERATIVE ARTERIOGRAM TIMES TWO;  Surgeon: Serafina Mitchell, MD;  Location: [facility] OR;  Service: Vascular;  Laterality: Left;   IR FLUORO GUIDE CV LINE RIGHT  03/20/2017   IR FLUORO GUIDE CV LINE RIGHT  05/05/2018   IR REMOVAL TUN CV CATH W/O FL  05/20/2018   IR US GUIDE VASC ACCESS RIGHT  03/20/2017   IR US GUIDE VASC ACCESS RIGHT  05/05/2018   IRRIGATION AND DEBRIDEMENT BUTTOCKS Right 09/30/2016   Procedure: DEBRIDEMENT RIGHT  BUTTOCK WOUND;  Surgeon: Georganna Skeans, MD;  Location: [place];  Service: General;  Laterality: Right;   left foot surgery     PERIPHERAL VASCULAR CATHETERIZATION N/A 05/07/2016   Procedure: Abdominal Aortogram;  Surgeon: Serafina Mitchell, MD;  Location: [place] CV LAB;  Service: Cardiovascular;  Laterality: N/A;   PERIPHERAL VASCULAR CATHETERIZATION N/A 05/07/2016   Procedure: Lower Extremity Angiography;  Surgeon: Serafina Mitchell, MD;  Location: [place] CV LAB;  Service: Cardiovascular;  Laterality: N/A;   PERIPHERAL VASCULAR CATHETERIZATION  N/A 05/07/2016   Procedure: Aortic Arch Angiography;  Surgeon: Serafina Mitchell, MD;  Location: [place] CV LAB;  Service: Cardiovascular;  Laterality: N/A;   PERIPHERAL VASCULAR CATHETERIZATION N/A 05/07/2016   Procedure: Upper Extremity Angiography;  Surgeon: Serafina Mitchell, MD;  Location: [place] CV LAB;  Service: Cardiovascular;  Laterality: N/A;   PERIPHERAL VASCULAR CATHETERIZATION Right 05/07/2016   Procedure: Peripheral Vascular Balloon Angioplasty;  Surgeon: Serafina Mitchell, MD;  Location: [place] CV LAB;  Service: Cardiovascular;  Laterality: Right;  subclavian   PERIPHERAL VASCULAR CATHETERIZATION Right 05/07/2016   Procedure: Peripheral Vascular Intervention;  Surgeon: Serafina Mitchell, MD;  Location: [place] CV LAB;  Service: Cardiovascular;  Laterality: Right;  External  Iliac   POLYPECTOMY  03/13/2021   Procedure: POLYPECTOMY;  Surgeon: Harvel Quale, MD;  [facility];   Service: Gastroenterology;;   SKIN GRAFT Right 2012   [name] by Dr. Nils Pyle- Right and Left Ankle   SPINE SURGERY     THROMBECTOMY FEMORAL ARTERY  11/28/2017   Procedure: THROMBECTOMY  & REVISION OF BILATERAL FEMORAL TO [place];  Surgeon: Serafina Mitchell, MD;  Location: [place];  Service: Vascular;;   TONSILLECTOMY     TRANSMETATARSAL AMPUTATION Right 10/07/2020   Procedure: RIGHT BELOW KNEE AMPUTATION;  Surgeon: Erle Crocker, MD;  Location: [place];  Service: Orthopedics;  Laterality: Right;    Prior to Admission medications   Medication Sig Start Date End Date Taking? Authorizing Provider  amitriptyline (ELAVIL) 25 MG tablet Take 1 tablet (25 mg total) by mouth at bedtime. 12/05/20  Yes Gerlene Fee, NP  diphenoxylate-atropine (LOMOTIL) 2.5-0.025 MG tablet Take 1 tablet by mouth 3 (three) times daily. 04/19/21  Yes Aliene Altes S, PA-C  insulin glargine (LANTUS) 100 UNIT/ML injection Inject 10-15 Units into the skin daily.   Yes [provider]  insulin lispro (HUMALOG KWIKPEN) 100 UNIT/ML KwikPen Inject 4-10 Units into the skin 3 (three) times daily with meals. If blood sugar 121-150, 2 units If blood sugar 151-200, 4 units If blood sugar 201-250, 6 units If blood sugar 251-300, 8 units If blood sugar 301-500, 10 units If blood sugar greater than 350 call MD 12/05/20  Yes Gerlene Fee, NP  lipase/protease/amylase (CREON) 36000 UNITS CPEP capsule Take 3 capsules (108,000 Units total) by mouth 3 (three) times daily with meals AND 2 capsules (72,000 Units total) with snacks. 04/19/21  Yes Aliene Altes S, PA-C  metoprolol tartrate (LOPRESSOR) 25 MG tablet Take 0.5 tablets (12.5 mg total) by mouth 2 (two) times daily. 12/05/20  Yes Gerlene Fee, NP  omeprazole (PRILOSEC) 40 MG capsule Take 1 capsule (40 mg total) by mouth daily. 12/05/20  Yes Gerlene Fee, NP  oxyCODONE-acetaminophen (PERCOCET) 5-325 MG tablet Take 1 tablet by mouth every 6 (six) hours as needed  for severe pain. 07/16/21  Yes Mesner, Corene Cornea, MD  pregabalin (LYRICA) 100 MG capsule Take 1 capsule (100 mg total) by mouth 2 (two) times daily. 03/16/21  Yes Kathie Dike, MD  Rivaroxaban (XARELTO) 15 MG TABS tablet Take 1 tablet (15 mg total) by mouth daily with supper. 12/05/20  Yes Gerlene Fee, NP  sodium bicarbonate 650 MG tablet Take 1 tablet (650 mg total) by mouth 2 (two) times daily. 12/05/20  Yes Gerlene Fee, NP  SPIRIVA HANDIHALER 18 MCG inhalation capsule Place 1 capsule into inhaler and inhale daily. 06/01/21  Yes [provider]  torsemide (DEMADEX) 20 MG tablet Take  1 tablet (20 mg total) by mouth daily. 12/05/20  Yes Gerlene Fee, NP  albuterol (PROVENTIL) 2 MG tablet Take 1 tablet (2 mg total) by mouth daily as needed for shortness of breath. Patient not taking: No sig reported 12/05/20   Gerlene Fee, NP  amiodarone (PACERONE) 200 MG tablet Take 1 tablet (200 mg total) by mouth See admin instructions. oral amiodarone 400mg  bid x 7 days, then 200mg  bid x 2 weeks, then 200mg  daily after that Patient not taking: No sig reported 12/05/20   Gerlene Fee, NP  Balsam [place]-Castor Oil [facility]) OINT Apply 1 application topically 3 (three) times daily. Every shift to bilateral buttocks, coccyx, and sacrum    [provider]  cholestyramine light (PREVALITE) 4 g packet Take 1 packet (4 g total) by mouth 2 (two) times daily. Take either 2 hours before or 2 hours after usual medications are taken. 04/02/21 05/02/21  Manuella Ghazi, Pratik D, DO  Choline Fenofibrate (FENOFIBRIC ACID) 45 MG CPDR Take 1 capsule by mouth daily. Patient not taking: No sig reported 01/03/21   [provider]  dicyclomine (BENTYL) 10 MG capsule Take 1 capsule (10 mg total) by mouth every 6 (six) hours as needed for spasms. Patient not taking: No sig reported 12/05/20   Gerlene Fee, NP  Insulin Syringe-Needle U-100 (INSULIN SYRINGE 1CC/31GX5/16") 31G X 5/16" 1 ML MISC For use with  administration of octreotide twice daily. 04/02/21   Manuella Ghazi, Pratik D, DO  naloxone [facility]) nasal spray 4 mg/0.1 mL Place 1 spray into the nose once as needed (opioid overdose). 12/05/20   Gerlene Fee, NP  nicotine (NICODERM CQ - DOSED IN MG/24 HOURS) 21 mg/24hr patch Place 21 mg onto the skin daily. Rotate sites and remove old patch prior to application    [provider]  Potassium Chloride ER 20 MEQ TBCR Take 20 mEq by mouth daily. 1 tab daily by mouth 12/05/20   Gerlene Fee, NP  PROAIR HFA 108 [phone] Base) MCG/ACT inhaler Inhale 1-2 puffs into the lungs every 6 (six) hours as needed for shortness of breath or wheezing. 06/01/21   [provider]  rosuvastatin (CRESTOR) 20 MG tablet Take 1 tablet (20 mg total) by mouth daily at 6 PM. Patient not taking: No sig reported 12/05/20   Gerlene Fee, NP  vitamin B-12 1000 MCG tablet Take 1 tablet (1,000 mcg total) by mouth daily. Patient not taking: No sig reported 10/12/20   Mckinley Jewel, MD    Allergies as of 07/20/2021 - Review Complete 07/20/2021  Allergen Reaction Noted   Lactose intolerance (gi) Diarrhea 05/04/2018    Family History  Problem Relation Age of Onset   Coronary artery disease Mother    Peripheral vascular disease Mother    Heart disease Mother        Before age 77   Other Mother        Venous insuffiency   Diabetes Mother    Hyperlipidemia Mother    Hypertension Mother    Varicose Veins Mother    Heart attack Mother        before age 77   Heart disease Father    Diabetes Father    Diabetes Maternal Grandmother    Diabetes Paternal Grandmother    Diabetes Paternal Grandfather    Diabetes Sister    Hypertension Sister    Diabetes Brother    Hypertension Brother     Social History   Socioeconomic History  Marital status: Married    Spouse name: Not on file   Number of children: Not on file   Years of education: Not on file   Highest education level: Not on file  Occupational  History   Occupation: disabled  Tobacco Use   Smoking status: Every Day    Packs/day: 0.50    Years: 30.00    Pack years: 15.00    Types: Cigarettes   Smokeless tobacco: Never  Vaping Use   Vaping Use: Never used  Substance and Sexual Activity   Alcohol use: No    Alcohol/week: 0.0 standard drinks   Drug use: No   Sexual activity: Yes    Birth control/protection: Post-menopausal  Other Topics Concern   Not on file  Social History Narrative   Not on file   Social Determinants of Health   Financial Resource Strain: Not on file  Food Insecurity: Not on file  Transportation Needs: Not on file  Physical Activity: Not on file  Stress: Not on file  Social Connections: Not on file  Intimate Partner Violence: Not on file    Review of Systems: See HPI, otherwise negative ROS  Physical Exam: BP 103/65 (BP Location: Right Arm)   Pulse 75   Temp (!) 97.5 F (36.4 C) (Oral)   Resp 18   Ht 5\' 7"  (1.702 m)   Wt 98 kg   SpO2 100%   BMI 33.84 kg/m  General:   Alert,  pleasant and cooperative in NAD Head:  Normocephalic and atraumatic. Neck:  Supple; no masses or thyromegaly. Lungs:  Clear throughout to auscultation, normal respiratory effort.    Heart:  +S1, +S2, Regular rate and rhythm, No edema. Abdomen:  Soft, nontender and nondistended. Normal bowel sounds, without guarding, and without rebound.   Neurologic:  Alert and  oriented x4;  grossly normal neurologically.  Impression/Plan: [name] is here for an endoscopy  to be performed for  evaluation of GI bleed    Risks, benefits, limitations, and alternatives regarding endoscopy have been reviewed with the patient.  Questions have been answered.  All parties agreeable.   Jonathon Bellows, MD  07/27/2021, 11:32 AM

## 2021-07-27 NOTE — Op Note (Signed)
[facility] Gastroenterology Patient Name: [name] Procedure Date: 07/27/2021 11:32 AM MRN: [id number] Account #: [id number] Date of Birth: 06/08/1966 Admit Type: Outpatient Age: 55 Room: [facility] ENDO ROOM 3 Gender: Female Note Status: Finalized Instrument Name: Upper Endoscope [id number] Procedure:             Upper GI endoscopy Indications:           Hematemesis Providers:             Jonathon Bellows MD, MD Medicines:             Monitored Anesthesia Care Complications:         No immediate complications. Procedure:             Pre-Anesthesia Assessment:                        - Prior to the procedure, a History and Physical was                         performed, and patient medications, allergies and                         sensitivities were reviewed. The patient's tolerance                         of previous anesthesia was reviewed.                        - The risks and benefits of the procedure and the                         sedation options and risks were discussed with the                         patient. All questions were answered and informed                         consent was obtained.                        - ASA Grade Assessment: III - A patient with severe                         systemic disease.                        After obtaining informed consent, the endoscope was                         passed under direct vision. Throughout the procedure,                         the patient's blood pressure, pulse, and oxygen                         saturations were monitored continuously. The Endoscope                         was introduced through the mouth, and advanced to the  third part of duodenum. The upper GI endoscopy was                         accomplished without difficulty. The patient tolerated                         the procedure well. Findings:      Food was found in the entire esophagus.      A large amount of food  (residue) was found on the greater curvature of       the gastric antrum.      Diffuse hemorrhagic mucosa with no stigmata of recent bleeding was found       on the greater curvature of the gastric antrum. Biopsies were taken with       a cold forceps for histology. Limited visualization due to large ty of       fdood      The examined duodenum was normal. Impression:            - Food in the esophagus.                        - A large amount of food (residue) in the stomach.                        - Hemorrhagic gastropathy. Biopsied.                        - Normal examined duodenum. Recommendation:        - Return patient to hospital ward for ongoing care.                        - NPO today.                        - Repeat upper endoscopy tomorrow. Procedure Code(s):     --- Professional ---                        [phone], Esophagogastroduodenoscopy, flexible,                         transoral; with biopsy, single or multiple Diagnosis Code(s):     --- Professional ---                        M09.470J, Food in esophagus causing other injury,                         initial encounter                        K92.2, Gastrointestinal hemorrhage, unspecified                        K92.0, Hematemesis CPT copyright 2019 American Medical Association. All rights reserved. The codes documented in this report are preliminary and upon coder review may  be revised to meet current compliance requirements. Jonathon Bellows, MD Jonathon Bellows MD, MD 07/27/2021 11:57:02 AM This report has been signed electronically. Number of Addenda: 0 Note Initiated On: 07/27/2021 11:32 AM Estimated Blood Loss:  Estimated blood loss: none.  [facility]

## 2021-07-27 NOTE — Anesthesia Preprocedure Evaluation (Addendum)
Anesthesia Evaluation  Patient identified by MRN, date of birth, ID band Patient awake    Reviewed: Allergy & Precautions, NPO status , Patient's Chart, lab work & pertinent test results, reviewed documented beta blocker date and time   History of Anesthesia Complications (+) DIFFICULT AIRWAY and Family history of anesthesia reactionNegative for: history of anesthetic complications  Airway Mallampati: II  TM Distance: >3 FB Neck ROM: Full    Dental  (+) Dental Advisory Given, Poor Dentition, Chipped, Missing   Pulmonary asthma , Current Smoker and Patient abstained from smoking.,    Pulmonary exam normal breath sounds clear to auscultation       Cardiovascular Exercise Tolerance: Poor hypertension, Pt. on medications and Pt. on home beta blockers + CAD, + Past MI, + Peripheral Vascular Disease and +CHF  Normal cardiovascular exam Rhythm:Regular Rate:Normal  1. Global hypokinesis with more prominent anteroseptal and apical  hypokinesis. . Left ventricular ejection fraction, by estimation, is 35 to  40%. The left ventricle has moderately decreased function. The left  ventricle demonstrates regional wall motion  abnormalities (see scoring diagram/findings for description). Left  ventricular diastolic parameters are indeterminate.  2. Right ventricular systolic function is normal. The right ventricular  size is normal.  3. Left atrial size was severely dilated.  4. The pericardial effusion is circumferential.  5. The mitral valve is normal in structure. No evidence of mitral valve  regurgitation. No evidence of mitral stenosis.  6. The aortic valve has an indeterminant number of cusps. Aortic valve  regurgitation is not visualized. No aortic stenosis is present.  7. The inferior vena cava is normal in size with greater than 50%  respiratory variability, suggesting right atrial pressure of 3 mmHg.    Neuro/Psych PSYCHIATRIC  DISORDERS Depression  Neuromuscular disease    GI/Hepatic GERD  Medicated,  Endo/Other  diabetes, Poorly Controlled, Type 2, Insulin Dependent  Renal/GU Renal disease     Musculoskeletal  (+) Arthritis , Osteoarthritis,    Abdominal   Peds  Hematology  (+) anemia ,   Anesthesia Other Findings Abnormal stress test   Arthritis  Asthma    Carotid arterial disease ([place])  a. 09/2017 Carotid U/S: 40-49% bilat ICA stenosis. Chronic back pain    Coronary artery calcification seen on CT scan  a. 11/2017 CT Abd/Pelvis: Multi vessel coronary vascular Ca2+.  Depression    Diabetes mellitus    Diabetic neuropathy ([place])    Difficult intubation  DIFFICULT AIRWAY/FYI  Family history of adverse reaction to anesthesia  mother had difficlty waking   Femoral-popliteal bypass graft occlusion, left ([place]) 12/02/2017   GERD (gastroesophageal reflux disease)  Hyperlipidemia    Ischemic cardiomyopathy 1. Left ventricular ejection fraction, by estimation, is 25 to 30%. The  left ventricle has severely decreased function.  . Mild MR. Mildly dil LA, mod dil RV w/ mod red RV fxn, mild TR, PASP 66mmHg. NSTEMI (non-ST elevated myocardial infarction) ([place])  a. 05/2018 in setting of Afib, sepsis, and post-op L AKA. Peak trop 7.7. EF 30-35% by echo-->cath not performed 2/2 renal failure.  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 S/p L fem-pop bypass; b. 11/2017 s/p Aortobifem bypass 2/2 graft occlusion; c. 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  Paroxysmal atrial fibrillation with rapid ventricular response ([place]) 12/02/2017 a. CHA2DS2VASc = 3-->Xarelto; b. 05/2018 Recurrent Afib-->amio.  Renal insufficiency    Ulcer  Foot   Cleared by cardiology for EGD 07-24-21  Reproductive/Obstetrics                            Anesthesia Physical  Anesthesia Plan  ASA: 4  Anesthesia Plan: General   Post-op Pain  Management:    Induction: Intravenous  PONV Risk Score and Plan: 2 and Propofol infusion  Airway Management Planned: Nasal Cannula and Natural Airway  Additional Equipment:   Intra-op Plan:   Post-operative Plan: Possible Post-op intubation/ventilation  Informed Consent: I have reviewed the patients History and Physical, chart, labs and discussed the procedure including the risks, benefits and alternatives for the proposed anesthesia with the patient or authorized representative who has indicated his/her understanding and acceptance.     Dental advisory given  Plan Discussed with: CRNA, Surgeon and Anesthesiologist  Anesthesia Plan Comments:        Anesthesia Quick Evaluation Today's Vitals   07/27/21 0500 07/27/21 0538 07/27/21 0736 07/27/21 0753  BP:  106/66 118/71   Pulse:  74 78   Resp:  20 18   Temp:  36.4 C (!) 36.4 C   TempSrc:  Oral    SpO2:  98% 98%   Weight: 98 kg     Height:      PainSc:    Asleep   Body mass index is 33.84 kg/m.

## 2021-07-27 NOTE — Anesthesia Postprocedure Evaluation (Signed)
Anesthesia Post Note  Patient: [name]  Procedure(s) Performed: ESOPHAGOGASTRODUODENOSCOPY (EGD) WITH PROPOFOL  Patient location during evaluation: Phase II Anesthesia Type: General Level of consciousness: awake and alert, awake and oriented Pain management: pain level controlled Vital Signs Assessment: post-procedure vital signs reviewed and stable Respiratory status: spontaneous breathing, nonlabored ventilation and respiratory function stable Cardiovascular status: blood pressure returned to baseline and stable Postop Assessment: no apparent nausea or vomiting Anesthetic complications: no   No notable events documented.   Last Vitals:  Vitals:   07/27/21 1222 07/27/21 1249  BP: (!) 100/54 113/77  Pulse: 79 78  Resp: (!) 6 18  Temp:  36.5 C  SpO2: 97% 100%    Last Pain:  Vitals:   07/27/21 1249  TempSrc: Oral  PainSc:                  Phill Mutter
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
## 2021-07-27 NOTE — Progress Notes (Signed)
   Jonathon Bellows , MD [address] [place], [place], [place], [address] [address], [place], [place], [place], [address] Phone: [phone]  Fax: [phone]   JULEEN SORRELS is being followed for upper GI bleed   Subjective:   I just did her EGD. Large qty of food in the stomach limiting visualization . I could see behind the food a large ulcer with surrounding erythema ? Area of ischemia that was healing. Unclear.   Will plan to repeat EGD tomorrow with Dr Virgina Jock with the aim of better visualization. Keep NPO today . Informed Dr Soundra Pilon  Dr Jonathon Bellows MD,MRCP [facility]) Gastroenterology/Hepatology Pager: [phone]

## 2021-07-27 NOTE — Progress Notes (Signed)
[facility]  ROUNDING NOTE   Subjective:   [name] is a 55 y.o.  female with a past medical history of hypertension, Type 2 diabetes, atrial fib, ischemic myopathy and chronic kidney disease. She has been admitted for Acute pulmonary edema (HCC) [J81.0] Elevated troponin [R77.8] Acute renal failure (ARF) (HCC) [N17.9] Acute kidney injury ([place]) [N17.9] Acute on chronic systolic (congestive) heart failure (HCC) [I50.23] Closed fracture of one rib of right side with routine healing, subsequent encounter [S22.31XD]  Patient seen resting in bed Alert and oriented Spouse at bedside Reports feeling well today Poor appetite continues Denies shortness of breath   Objective:  Vital signs in last 24 hours:  Temp:  [96.7 F (35.9 C)-97.7 F (36.5 C)] 97.7 F (36.5 C) (09/09 1249) Pulse Rate:  [71-79] 78 (09/09 1249) Resp:  [0-20] 18 (09/09 1249) BP: (93-118)/(35-77) 113/77 (09/09 1249) SpO2:  [95 %-100 %] 100 % (09/09 1249) Weight:  [98 kg] 98 kg (09/09 0500)  Weight change:  Filed Weights   07/24/21 0500 07/25/21 0626 07/27/21 0500  Weight: 95.1 kg 98.8 kg 98 kg    Intake/Output: I/O last 3 completed shifts: In: 1504.8 [P.O.:1060; I.V.:216.1; IV Piggyback:228.7] Out: 1130 [Urine:1130]   Intake/Output this shift:  Total I/O In: 147.1 [I.V.:147.1] Out: 220 [Urine:220]  Physical Exam: General: NAD, laying in bed  Head: Normocephalic, atraumatic. Moist oral mucosal membranes  Eyes: Anicteric, legally blind  Lungs:  Clear to auscultation,  O2  Heart: Regular rate and rhythm  Abdomen:  firm, nontender, distended  Extremities:  2+ peripheral edema.  Neurologic: Nonfocal, moving all four extremities  Skin: No lesions       Basic Metabolic Panel: Recent Labs  Lab 07/23/21 0621 07/23/21 2133 07/24/21 0419 07/25/21 0507 07/26/21 0859 07/27/21 0635  NA 134*  --  135 133* 130* 131*  K 4.4  --  4.2 4.2 4.1 4.3  CL 104  --  103 102 97* 100  CO2  21*  --  23 21* 21* 22  GLUCOSE 85  --  80 99 129* 104*  BUN 64*  --  63* 66* 67* 71*  CREATININE 3.98*  --  3.86* 3.71* 4.24* 3.97*  CALCIUM 7.4*  --  7.7* 7.6* 7.6* 7.7*  MG 1.6* 1.7 2.1  --   --   --   PHOS 8.6*  --  8.3*  --  7.7* 7.6*     Liver Function Tests: Recent Labs  Lab 07/22/21 0435 07/23/21 0621 07/26/21 0859 07/27/21 0635  AST 10* 14*  --   --   ALT 8 8  --   --   ALKPHOS 80 71  --   --   BILITOT 0.6 1.0  --   --   PROT 5.8* 5.9*  --   --   ALBUMIN 2.7* 2.7* 3.0* 3.4*    No results for input(s): LIPASE, AMYLASE in the last 168 hours. Recent Labs  Lab 07/25/21 0648  AMMONIA 28     CBC: Recent Labs  Lab 07/22/21 0435 07/23/21 0621 07/23/21 2133 07/24/21 0419 07/25/21 0507  WBC 5.7 11.1* 10.1 9.3 9.5  NEUTROABS  --   --   --   --  8.2*  HGB 7.3* 8.8* 10.5* 10.0* 10.1*  HCT 23.8* 28.7* 32.2* 31.0* 30.9*  MCV 87.8 85.2 84.1 83.1 83.7  PLT 173 175 144* 137* 127*     Cardiac Enzymes: No results for input(s): CKTOTAL, CKMB, CKMBINDEX, TROPONINI in the last 168 hours.  BNP: Invalid input(s): POCBNP  CBG: Recent Labs  Lab 07/26/21 1122 07/26/21 1618 07/26/21 2100 07/27/21 0737 07/27/21 1114  GLUCAP 132* 111* 110* 108* 101*     Microbiology: Results for orders placed or performed during the hospital encounter of 07/20/21  Resp Panel by RT-PCR (Flu A&B, Covid) Nasopharyngeal Swab     Status: None   Collection Time: 07/20/21  6:14 PM   Specimen: Nasopharyngeal Swab; Nasopharyngeal(NP) swabs in vial transport medium  Result Value Ref Range Status   SARS Coronavirus 2 by RT PCR NEGATIVE NEGATIVE Final    Comment: (NOTE) SARS-CoV-2 target nucleic acids are NOT DETECTED.  The SARS-CoV-2 RNA is generally detectable in upper respiratory specimens during the acute phase of infection. The lowest concentration of SARS-CoV-2 viral copies this assay can detect is 138 copies/mL. A negative result does not preclude SARS-Cov-2 infection and should  not be used as the sole basis for treatment or other patient management decisions. A negative result may occur with  improper specimen collection/handling, submission of specimen other than nasopharyngeal swab, presence of viral mutation(s) within the areas targeted by this assay, and inadequate number of viral copies(<138 copies/mL). A negative result must be combined with clinical observations, patient history, and epidemiological information. The expected result is Negative.  Fact Sheet for Patients:  EntrepreneurPulse.com.au  Fact Sheet for Healthcare Providers:  IncredibleEmployment.be  This test is no t yet approved or cleared by the [place] FDA and  has been authorized for detection and/or diagnosis of SARS-CoV-2 by FDA under an Emergency Use Authorization (EUA). This EUA will remain  in effect (meaning this test can be used) for the duration of the COVID-19 declaration under Section 564(b)(1) of the Act, 21 U.S.C.section 360bbb-3(b)(1), unless the authorization is terminated  or revoked sooner.       Influenza A by PCR NEGATIVE NEGATIVE Final   Influenza B by PCR NEGATIVE NEGATIVE Final    Comment: (NOTE) The Xpert Xpress SARS-CoV-2/FLU/RSV plus assay is intended as an aid in the diagnosis of influenza from Nasopharyngeal swab specimens and should not be used as a sole basis for treatment. Nasal washings and aspirates are unacceptable for Xpert Xpress SARS-CoV-2/FLU/RSV testing.  Fact Sheet for Patients: EntrepreneurPulse.com.au  Fact Sheet for Healthcare Providers: IncredibleEmployment.be  This test is not yet approved or cleared by the [place] FDA and has been authorized for detection and/or diagnosis of SARS-CoV-2 by FDA under an Emergency Use Authorization (EUA). This EUA will remain in effect (meaning this test can be used) for the duration of the COVID-19 declaration under  Section 564(b)(1) of the Act, 21 U.S.C. section 360bbb-3(b)(1), unless the authorization is terminated or revoked.  Performed at [facility], [name] [place], [place] [address]     Coagulation Studies: No results for input(s): LABPROT, INR in the last 72 hours.  Urinalysis: No results for input(s): COLORURINE, LABSPEC, PHURINE, GLUCOSEU, HGBUR, BILIRUBINUR, KETONESUR, PROTEINUR, UROBILINOGEN, NITRITE, LEUKOCYTESUR in the last 72 hours.  Invalid input(s): APPERANCEUR    Imaging: No results found.   Medications:    sodium chloride Stopped (07/26/21 0928)   sodium chloride 10 mL/hr at 07/27/21 1143   albumin human 25 g (07/27/21 0702)   furosemide (LASIX) 200 mg in dextrose 5% 100 mL (2mg /mL) infusion 6 mg/hr (07/27/21 0857)    Chlorhexidine Gluconate Cloth  6 each Topical Daily   folic acid  1 mg Oral Daily   influenza vac split quadrivalent PF  0.5 mL Intramuscular Tomorrow-1000   insulin aspart  0-6 Units Subcutaneous TID WC   liver oil-zinc oxide   Topical See admin instructions   midodrine  5 mg Oral BID WC   pantoprazole (PROTONIX) IV  40 mg Intravenous Q12H   rifaximin  550 mg Oral BID   sodium chloride flush  10-40 mL Intracatheter Q12H   tiotropium  1 capsule Inhalation Daily   acetaminophen, alum & mag hydroxide-simeth, lactulose, nitroGLYCERIN, ondansetron (ZOFRAN) IV, sodium chloride flush, traMADol  Assessment/ Plan:  [name] is a 55 y.o.  female with a past medical history of hypertension, Type 2 diabetes, atrial fib, ischemic myopathy and chronic kidney disease. She has been admitted with Acute pulmonary edema (HCC) [J81.0] Elevated troponin [R77.8] Acute renal failure (ARF) (HCC) [N17.9] Acute kidney injury ([place]) [N17.9] Acute on chronic systolic (congestive) heart failure (HCC) [I50.23] Closed fracture of one rib of right side with routine healing, subsequent encounter [S22.31XD]   Acute kidney injury with possible  hepatorenal syndrome secondary  to liver cirrhosis and chronic systolic heart failure. EF 25-30%. Renal ultrasound negative for hydronephrosis. Fluid overloaded. UOP 838ml in last 24 hours. Lasix 6mg /hr and Albumin 25g three times a day. Edema has improved a small amount, but renal function remains elevated. May consider increasing Lasix drip tomorrow. No acute need for dialysis, but patient agreeable to dialysis if needed. Due to hypotension, dialysis would be difficult.    Lab Results  Component Value Date   CREATININE 3.97 (H) 07/27/2021   CREATININE 4.24 (H) 07/26/2021   CREATININE 3.71 (H) 07/25/2021    Intake/Output Summary (Last 24 hours) at 07/27/2021 1504 Last data filed at 07/27/2021 1205 Gross per 24 hour  Intake 264.66 ml  Output 500 ml  Net -235.34 ml    2. Hypotension BP soft 100/54, but stable. Continue Midodrine 5mg  and will monitor during diuretic therapy.     LOS: [place] 9/9/20223:04 PM

## 2021-07-27 NOTE — Transfer of Care (Signed)
Immediate Anesthesia Transfer of Care Note  Patient: [name]  Procedure(s) Performed: ESOPHAGOGASTRODUODENOSCOPY (EGD) WITH PROPOFOL  Patient Location: PACU and Endoscopy Unit  Anesthesia Type:General  Level of Consciousness: drowsy and patient cooperative  Airway & Oxygen Therapy: Patient Spontanous Breathing nasal cannula  Post-op Assessment: Report given to RN and Post -op Vital signs reviewed and stable  Post vital signs: Reviewed and stable  Last Vitals:  Vitals Value Taken Time  BP 99/35 07/27/21 1203  Temp 35.9 C 07/27/21 1202  Pulse 74 07/27/21 1206  Resp 6 07/27/21 1206  SpO2 98 % 07/27/21 1206  Vitals shown include unvalidated device data.  Last Pain:  Vitals:   07/27/21 1202  TempSrc: Temporal  PainSc: Asleep      Patients Stated Pain Goal: 0 (87/86/76 7209)  Complications: No notable events documented.

## 2021-07-28 ENCOUNTER — Inpatient Hospital Stay: Payer: Medicare Other | Admitting: Anesthesiology

## 2021-07-28 ENCOUNTER — Encounter: Payer: Self-pay | Admitting: Internal Medicine

## 2021-07-28 ENCOUNTER — Encounter
Admission: EM | Disposition: A | Discharge status: Hospice | Payer: Self-pay | Source: Home / Self Care | Attending: Internal Medicine

## 2021-07-28 ENCOUNTER — Inpatient Hospital Stay: Payer: Medicare Other

## 2021-07-28 HISTORY — PX: ESOPHAGOGASTRODUODENOSCOPY (EGD) WITH PROPOFOL: SHX5813

## 2021-07-28 LAB — RENAL FUNCTION PANEL
Albumin: 3.7 g/dL (ref 3.5–5.0)
Anion gap: 10 (ref 5–15)
BUN: 76 mg/dL — ABNORMAL HIGH (ref 6–20)
CO2: 22 mmol/L (ref 22–32)
Calcium: 7.7 mg/dL — ABNORMAL LOW (ref 8.9–10.3)
Chloride: 102 mmol/L (ref 98–111)
Creatinine, Ser: 4 mg/dL — ABNORMAL HIGH (ref 0.44–1.00)
GFR, Estimated: 12 mL/min — ABNORMAL LOW (ref >60)
Glucose, Bld: 77 mg/dL (ref 70–99)
Phosphorus: 7.8 mg/dL — ABNORMAL HIGH (ref 2.5–4.6)
Potassium: 4.2 mmol/L (ref 3.5–5.1)
Sodium: 134 mmol/L — ABNORMAL LOW (ref 135–145)

## 2021-07-28 LAB — CBC
HCT: 29.6 % — ABNORMAL LOW (ref 36.0–46.0)
Hemoglobin: 9.3 g/dL — ABNORMAL LOW (ref 12.0–15.0)
MCH: 27.7 pg (ref 26.0–34.0)
MCHC: 31.4 g/dL (ref 30.0–36.0)
MCV: 88.1 fL (ref 80.0–100.0)
Platelets: 107 10*3/uL — ABNORMAL LOW (ref 150–400)
RBC: 3.36 MIL/uL — ABNORMAL LOW (ref 3.87–5.11)
RDW: 17.4 % — ABNORMAL HIGH (ref 11.5–15.5)
WBC: 8.2 10*3/uL (ref 4.0–10.5)
nRBC: 0 % (ref 0.0–0.2)

## 2021-07-28 LAB — GLUCOSE, CAPILLARY
Glucose-Capillary: 92 mg/dL (ref 70–99)
Glucose-Capillary: 85 mg/dL (ref 70–99)

## 2021-07-28 SURGERY — ESOPHAGOGASTRODUODENOSCOPY (EGD) WITH PROPOFOL
Anesthesia: General

## 2021-07-28 MED ORDER — SODIUM CHLORIDE 0.9 % IV SOLN: INTRAVENOUS | Status: DC | PRN

## 2021-07-28 MED ORDER — DEXTROSE 50 % IV SOLN
0.5000 | Freq: Once | INTRAVENOUS | Status: AC
  Administered 2021-07-28: 25 mL via INTRAVENOUS

## 2021-07-28 MED ORDER — FENTANYL CITRATE (PF) 100 MCG/2ML IJ SOLN
INTRAMUSCULAR | Status: AC
  Filled 2021-07-28: qty 2

## 2021-07-28 MED ORDER — ONDANSETRON HCL 4 MG/2ML IJ SOLN
INTRAMUSCULAR | Status: DC | PRN
  Administered 2021-07-28: 4 mg via INTRAVENOUS

## 2021-07-28 MED ORDER — LIDOCAINE HCL (CARDIAC) PF 100 MG/5ML IV SOSY
PREFILLED_SYRINGE | INTRAVENOUS | Status: DC | PRN
  Administered 2021-07-28: 100 mg via INTRAVENOUS

## 2021-07-28 MED ORDER — ETOMIDATE 2 MG/ML IV SOLN
INTRAVENOUS | Status: DC | PRN
  Administered 2021-07-28: 12 mg via INTRAVENOUS

## 2021-07-28 MED ORDER — PROPOFOL 500 MG/50ML IV EMUL
INTRAVENOUS | Status: AC
  Filled 2021-07-28: qty 50

## 2021-07-28 MED ORDER — LORAZEPAM 2 MG/ML IJ SOLN
0.5000 mg | Freq: Once | INTRAMUSCULAR | Status: AC
Start: 2021-07-28 — End: 2021-07-28
  Administered 2021-07-28: 1 mg via INTRAVENOUS
  Filled 2021-07-28: qty 1

## 2021-07-28 MED ORDER — LIDOCAINE HCL (PF) 2 % IJ SOLN
INTRAMUSCULAR | Status: AC
  Filled 2021-07-28: qty 5

## 2021-07-28 MED ORDER — SUCCINYLCHOLINE CHLORIDE 200 MG/10ML IV SOSY
PREFILLED_SYRINGE | INTRAVENOUS | Status: DC | PRN
  Administered 2021-07-28: 130 mg via INTRAVENOUS

## 2021-07-28 MED ORDER — FENTANYL CITRATE (PF) 100 MCG/2ML IJ SOLN
INTRAMUSCULAR | Status: DC | PRN
  Administered 2021-07-28: 50 ug via INTRAVENOUS

## 2021-07-28 MED ORDER — SUCCINYLCHOLINE CHLORIDE 200 MG/10ML IV SOSY
PREFILLED_SYRINGE | INTRAVENOUS | Status: AC
  Filled 2021-07-28: qty 10

## 2021-07-28 MED ORDER — DEXAMETHASONE SODIUM PHOSPHATE 10 MG/ML IJ SOLN
INTRAMUSCULAR | Status: AC
  Filled 2021-07-28: qty 1

## 2021-07-28 MED ORDER — DIPHENHYDRAMINE HCL 25 MG PO CAPS
25.0000 mg | ORAL_CAPSULE | Freq: Every evening | ORAL | Status: DC | PRN
  Administered 2021-07-29: 25 mg via ORAL
  Filled 2021-07-28: qty 1

## 2021-07-28 MED ORDER — CALCIUM CHLORIDE 10 % IV SOLN
INTRAVENOUS | Status: AC
  Filled 2021-07-28: qty 10

## 2021-07-28 MED ORDER — PROPOFOL 10 MG/ML IV BOLUS
INTRAVENOUS | Status: AC
  Filled 2021-07-28: qty 20

## 2021-07-28 MED ORDER — PHENYLEPHRINE HCL (PRESSORS) 10 MG/ML IV SOLN
INTRAVENOUS | Status: DC | PRN
  Administered 2021-07-28 (×2): 100 ug via INTRAVENOUS

## 2021-07-28 MED ORDER — DEXAMETHASONE SODIUM PHOSPHATE 10 MG/ML IJ SOLN
INTRAMUSCULAR | Status: DC | PRN
  Administered 2021-07-28: 4 mg via INTRAVENOUS

## 2021-07-28 MED ORDER — ONDANSETRON HCL 4 MG/2ML IJ SOLN
INTRAMUSCULAR | Status: AC
  Filled 2021-07-28: qty 2

## 2021-07-28 MED ORDER — TRAZODONE HCL 50 MG PO TABS
25.0000 mg | ORAL_TABLET | Freq: Every evening | ORAL | Status: DC | PRN
  Administered 2021-07-29: 25 mg via ORAL
  Filled 2021-07-28: qty 1

## 2021-07-28 MED ORDER — GLYCOPYRROLATE 0.2 MG/ML IJ SOLN
INTRAMUSCULAR | Status: AC
  Filled 2021-07-28: qty 1

## 2021-07-28 MED ORDER — PHENYLEPHRINE HCL (PRESSORS) 10 MG/ML IV SOLN
INTRAVENOUS | Status: AC
  Filled 2021-07-28: qty 1

## 2021-07-28 MED ORDER — ETOMIDATE 2 MG/ML IV SOLN
INTRAVENOUS | Status: AC
  Filled 2021-07-28: qty 10

## 2021-07-28 NOTE — H&P (Signed)
[facility] Pre-Procedure H&P   Patient ID: [name] is a 55 y.o. female.  Gastroenterology Provider: Annamaria Helling, DO  Referring Provider: Jonathon Bellows, MD PCP: Frazier Richards, MD  Date: 07/28/2021  HPI Ms. [name] is a 55 y.o. female who presents today for Esophagogastroduodenoscopy for upper gastrointestinal bleeding.   Past Medical History:  Diagnosis Date   Abnormal stress test    a. 02/2017 MV: large region of fixed perfusion defect in basal to mid inf, mid-dist inflat walls, EF 43%. No ischemia (EF 55-60% by f/u echo).   Arthritis    Asthma    Carotid arterial disease ([place])    a. 09/2017 Carotid U/S: 40-49% bilat ICA stenosis.   Chronic back pain    Coronary artery calcification seen on CT scan    a. 11/2017 CT Abd/Pelvis: Multi vessel coronary vascular Ca2+.   Depression    Diabetes mellitus    Diabetic neuropathy ([place])    Difficult intubation    DIFFICULT AIRWAY/FYI   Family history of adverse reaction to anesthesia    mother had difficlty waking    Femoral-popliteal bypass graft occlusion, left ([place]) 12/02/2017   GERD (gastroesophageal reflux disease)    History of echocardiogram    a. 03/2017 Echo: EF 55-60%, mild LVH, nl RV fxn.   Hyperlipidemia    Ischemic cardiomyopathy    a. 04/2018 Echo: EF 30-35%, Gr2 DD, mild LVH. Mild MR. Mildly dil LA, mod dil RV w/ mod red RV fxn, mild TR, PASP 22mmHg.   NSTEMI (non-ST elevated myocardial infarction) ([place])    a. 05/2018 in setting of Afib, sepsis, and post-op L AKA. Peak trop 7.7. EF 30-35% by echo-->cath not performed 2/2 renal failure.   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 S/p L fem-pop bypass; b. 11/2017 s/p Aortobifem bypass 2/2 graft occlusion; c. 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   Paroxysmal atrial fibrillation with rapid ventricular response ([place]) 12/02/2017   a. CHA2DS2VASc = 3-->Xarelto;  b. 05/2018 Recurrent Afib-->amio.   Renal insufficiency    Ulcer    Foot    Past Surgical History:  Procedure Laterality Date   ABDOMINAL AORTAGRAM  June 15, 2014   ABDOMINAL AORTAGRAM N/A 06/15/2014   Procedure: ABDOMINAL Maxcine Ham;  Surgeon: Serafina Mitchell, MD;  Location: [facility] CATH LAB;  Service: Cardiovascular;  Laterality: N/A;   ABDOMINAL AORTAGRAM N/A 11/22/2014   Procedure: ABDOMINAL Maxcine Ham;  Surgeon: Serafina Mitchell, MD;  Location: [place] CATH LAB;  Service: Cardiovascular;  Laterality: N/A;   ABDOMINAL AORTOGRAM W/LOWER EXTREMITY N/A 01/07/2017   Procedure: Abdominal Aortogram w/Lower Extremity;  Surgeon: Serafina Mitchell, MD;  Location: [place] CV LAB;  Service: Cardiovascular;  Laterality: N/A;   ABDOMINAL AORTOGRAM W/LOWER EXTREMITY N/A 10/31/2017   Procedure: ABDOMINAL AORTOGRAM W/LOWER EXTREMITY;  Surgeon: Elam Dutch, MD;  Location: [place] CV LAB;  Service: Cardiovascular;  Laterality: N/A;   ABDOMINAL AORTOGRAM W/LOWER EXTREMITY N/A 03/24/2018   Procedure: ABDOMINAL AORTOGRAM W/LOWER EXTREMITY;  Surgeon: Serafina Mitchell, MD;  Location: [place] CV LAB;  Service: Cardiovascular;  Laterality: N/A;   AMPUTATION Left 04/26/2018   Procedure: AMPUTATION ABOVE KNEE;  Surgeon: Elam Dutch, MD;  Location: [facility] OR;  Service: Vascular;  Laterality: Left;   AORTA - BILATERAL FEMORAL ARTERY BYPASS GRAFT N/A 11/28/2017   Procedure: AORTA BIFEMORAL BYPASS USING HEMASHIELD GOLD GRAFT & REIMPLANT IMA;  Surgeon: Serafina Mitchell, MD;  Location: [facility] OR;  Service: Vascular;  Laterality: N/A;   AORTIC ARCH ANGIOGRAPHY N/A 10/31/2017   Procedure: AORTIC ARCH ANGIOGRAPHY;  Surgeon: Elam Dutch, MD;  Location: [place] CV LAB;  Service: Cardiovascular;  Laterality: N/A;   APPLICATION OF WOUND VAC  11/28/2017   Procedure: APPLICATION OF WOUND VAC;  Surgeon: Serafina Mitchell, MD;  Location: [place];  Service: Vascular;;   APPLICATION OF WOUND VAC Left 03/27/2018   Procedure:  APPLICATION OF WOUND VAC LEFT GROIN USING [place];  Surgeon: Serafina Mitchell, MD;  Location: [place];  Service: Vascular;  Laterality: Left;   ARTERIAL BYPASS SURGERY   07/05/2010   Right Common Femoral to below knee popliteal BPG   BACK SURGERY     X's  2   BIOPSY  03/13/2021   Procedure: BIOPSY;  Surgeon: Harvel Quale, MD;  [facility];  Service: Gastroenterology;;   CARDIAC CATHETERIZATION     CHOLECYSTECTOMY     Gall Bladder   COLONOSCOPY WITH PROPOFOL N/A 03/13/2021   Procedure: COLONOSCOPY WITH PROPOFOL;  Surgeon: Harvel Quale, MD;  [facility];  Service: Gastroenterology;  Laterality: N/A;  with random colonic biopsies   CYSTECTOMY Left    wrist   EMBOLECTOMY Left 11/28/2017   Procedure: Left Lower Extremity Embolectomy, Left Tibial Peroneal Trunk Endarterectomy with Patch Angioplasty; Vein Harvest Small Saphenous Graft Left Lower Leg;  Surgeon: [name], MD;  Location: [place];  Service: Vascular;  Laterality: Left;   ESOPHAGOGASTRODUODENOSCOPY (EGD) WITH PROPOFOL N/A 03/13/2021   Procedure: ESOPHAGOGASTRODUODENOSCOPY (EGD) WITH PROPOFOL;  Surgeon: Harvel Quale, MD;  [facility];  Service: Gastroenterology;  Laterality: N/A;   EYE SURGERY Left 02/23/2020   Dr. Zadie Rhine   FEMORAL-POPLITEAL BYPASS GRAFT Left 03/27/2018   Procedure: THROMBECTOMY OF LEFT FEMORAL TIBIAL BYPASS;  Surgeon: Serafina Mitchell, MD;  [facility] OR;  Service: Vascular;  Laterality: Left;   FEMORAL-TIBIAL BYPASS GRAFT Left 03/27/2018   Procedure: BYPASS GRAFT FEMORAL-TIBIAL ARTERY LEFT REDO USING CRYOPRESERVED SAPHENOUS VEIN 70cm;  Surgeon: Serafina Mitchell, MD;  Location: [facility] OR;  Service: Vascular;  Laterality: Left;   INTERCOSTAL NERVE BLOCK  November 2015   INTRAOPERATIVE ARTERIOGRAM  11/28/2017   Procedure: INTRA OPERATIVE ARTERIOGRAM OF LEFT LEG;  Surgeon: Serafina Mitchell, MD;  Location: [place];  Service: Vascular;;    INTRAOPERATIVE ARTERIOGRAM Left 03/27/2018   Procedure: INTRA OPERATIVE ARTERIOGRAM TIMES TWO;  Surgeon: Serafina Mitchell, MD;  Location: [facility] OR;  Service: Vascular;  Laterality: Left;   IR FLUORO GUIDE CV LINE RIGHT  03/20/2017   IR FLUORO GUIDE CV LINE RIGHT  05/05/2018   IR REMOVAL TUN CV CATH W/O FL  05/20/2018   IR US GUIDE VASC ACCESS RIGHT  03/20/2017   IR US GUIDE VASC ACCESS RIGHT  05/05/2018   IRRIGATION AND DEBRIDEMENT BUTTOCKS Right 09/30/2016   Procedure: DEBRIDEMENT RIGHT  BUTTOCK WOUND;  Surgeon: Georganna Skeans, MD;  Location: [place];  Service: General;  Laterality: Right;   left foot surgery     PERIPHERAL VASCULAR CATHETERIZATION N/A 05/07/2016   Procedure: Abdominal Aortogram;  Surgeon: Serafina Mitchell, MD;  Location: [place] CV LAB;  Service: Cardiovascular;  Laterality: N/A;   PERIPHERAL VASCULAR CATHETERIZATION N/A 05/07/2016   Procedure: Lower Extremity Angiography;  Surgeon: Serafina Mitchell, MD;  Location: [place] CV LAB;  Service: Cardiovascular;  Laterality: N/A;   PERIPHERAL VASCULAR CATHETERIZATION N/A 05/07/2016  Procedure: Aortic Arch Angiography;  Surgeon: Serafina Mitchell, MD;  Location: [place] CV LAB;  Service: Cardiovascular;  Laterality: N/A;   PERIPHERAL VASCULAR CATHETERIZATION N/A 05/07/2016   Procedure: Upper Extremity Angiography;  Surgeon: Serafina Mitchell, MD;  Location: [place] CV LAB;  Service: Cardiovascular;  Laterality: N/A;   PERIPHERAL VASCULAR CATHETERIZATION Right 05/07/2016   Procedure: Peripheral Vascular Balloon Angioplasty;  Surgeon: Serafina Mitchell, MD;  Location: [place] CV LAB;  Service: Cardiovascular;  Laterality: Right;  subclavian   PERIPHERAL VASCULAR CATHETERIZATION Right 05/07/2016   Procedure: Peripheral Vascular Intervention;  Surgeon: Serafina Mitchell, MD;  Location: [place] CV LAB;  Service: Cardiovascular;  Laterality: Right;  External  Iliac   POLYPECTOMY  03/13/2021   Procedure: POLYPECTOMY;  Surgeon: Harvel Quale, MD;  [facility];  Service: Gastroenterology;;   SKIN GRAFT Right 2012   [name] by Dr. Nils Pyle- Right and Left Ankle   SPINE SURGERY     THROMBECTOMY FEMORAL ARTERY  11/28/2017   Procedure: THROMBECTOMY  & REVISION OF BILATERAL FEMORAL TO [place];  Surgeon: Serafina Mitchell, MD;  Location: [place];  Service: Vascular;;   TONSILLECTOMY     TRANSMETATARSAL AMPUTATION Right 10/07/2020   Procedure: RIGHT BELOW KNEE AMPUTATION;  Surgeon: Erle Crocker, MD;  Location: [place];  Service: Orthopedics;  Laterality: Right;   Review of Systems  HENT:  Negative for trouble swallowing.   Gastrointestinal:  Positive for constipation. Negative for abdominal pain, blood in stool, diarrhea, nausea and vomiting.  All other systems reviewed and are negative.   Medications No current facility-administered medications on file prior to encounter.   Current Outpatient Medications on File Prior to Encounter  Medication Sig Dispense Refill   amitriptyline (ELAVIL) 25 MG tablet Take 1 tablet (25 mg total) by mouth at bedtime. 30 tablet 0   diphenoxylate-atropine (LOMOTIL) 2.5-0.025 MG tablet Take 1 tablet by mouth 3 (three) times daily. 90 tablet 1   insulin glargine (LANTUS) 100 UNIT/ML injection Inject 10-15 Units into the skin daily.     insulin lispro (HUMALOG KWIKPEN) 100 UNIT/ML KwikPen Inject 4-10 Units into the skin 3 (three) times daily with meals. If blood sugar 121-150, 2 units If blood sugar 151-200, 4 units If blood sugar 201-250, 6 units If blood sugar 251-300, 8 units If blood sugar 301-500, 10 units If blood sugar greater than 350 call MD 15 mL 0   lipase/protease/amylase (CREON) 36000 UNITS CPEP capsule Take 3 capsules (108,000 Units total) by mouth 3 (three) times daily with meals AND 2 capsules (72,000 Units total) with snacks. 390 capsule 11   metoprolol tartrate (LOPRESSOR) 25 MG tablet Take 0.5 tablets (12.5 mg total) by mouth 2 (two) times daily. 30 tablet  0   omeprazole (PRILOSEC) 40 MG capsule Take 1 capsule (40 mg total) by mouth daily. 30 capsule 0   oxyCODONE-acetaminophen (PERCOCET) 5-325 MG tablet Take 1 tablet by mouth every 6 (six) hours as needed for severe pain. 20 tablet 0   pregabalin (LYRICA) 100 MG capsule Take 1 capsule (100 mg total) by mouth 2 (two) times daily. 60 capsule 0   Rivaroxaban (XARELTO) 15 MG TABS tablet Take 1 tablet (15 mg total) by mouth daily with supper. 30 tablet 0   sodium bicarbonate 650 MG tablet Take 1 tablet (650 mg total) by mouth 2 (two) times daily. 60 tablet 0   SPIRIVA HANDIHALER 18 MCG inhalation capsule Place 1 capsule into inhaler and inhale daily.  torsemide (DEMADEX) 20 MG tablet Take 1 tablet (20 mg total) by mouth daily. 30 tablet 0   albuterol (PROVENTIL) 2 MG tablet Take 1 tablet (2 mg total) by mouth daily as needed for shortness of breath. (Patient not taking: No sig reported) 30 tablet 0   amiodarone (PACERONE) 200 MG tablet Take 1 tablet (200 mg total) by mouth See admin instructions. oral amiodarone 400mg  bid x 7 days, then 200mg  bid x 2 weeks, then 200mg  daily after that (Patient not taking: No sig reported) 90 tablet 0   Balsam [place]-Castor Oil (VENELEX) OINT Apply 1 application topically 3 (three) times daily. Every shift to bilateral buttocks, coccyx, and sacrum     cholestyramine light (PREVALITE) 4 g packet Take 1 packet (4 g total) by mouth 2 (two) times daily. Take either 2 hours before or 2 hours after usual medications are taken. 60 packet 1   Choline Fenofibrate (FENOFIBRIC ACID) 45 MG CPDR Take 1 capsule by mouth daily. (Patient not taking: No sig reported)     dicyclomine (BENTYL) 10 MG capsule Take 1 capsule (10 mg total) by mouth every 6 (six) hours as needed for spasms. (Patient not taking: No sig reported) 30 capsule 0   Insulin Syringe-Needle U-100 (INSULIN SYRINGE 1CC/31GX5/16") 31G X 5/16" 1 ML MISC For use with administration of octreotide twice daily. 100 each 1    naloxone (NARCAN) nasal spray 4 mg/0.1 mL Place 1 spray into the nose once as needed (opioid overdose). 1 each 0   nicotine (NICODERM CQ - DOSED IN MG/24 HOURS) 21 mg/24hr patch Place 21 mg onto the skin daily. Rotate sites and remove old patch prior to application     Potassium Chloride ER 20 MEQ TBCR Take 20 mEq by mouth daily. 1 tab daily by mouth 30 tablet 0   PROAIR HFA 108 (90 Base) MCG/ACT inhaler Inhale 1-2 puffs into the lungs every 6 (six) hours as needed for shortness of breath or wheezing.     rosuvastatin (CRESTOR) 20 MG tablet Take 1 tablet (20 mg total) by mouth daily at 6 PM. (Patient not taking: No sig reported) 30 tablet 0   vitamin B-12 1000 MCG tablet Take 1 tablet (1,000 mcg total) by mouth daily. (Patient not taking: No sig reported) 30 tablet 0    Pertinent medications related to GI and procedure were reviewed by me with the patient prior to the procedure   Current Facility-Administered Medications:    0.9 %  sodium chloride infusion, 250 mL, Intravenous, Continuous, Sharion Settler, NP, Stopped at 07/26/21 0928   0.9 %  sodium chloride infusion, , Intravenous, Continuous, Amin, Soundra Pilon, MD, Stopping Infusion hung by another clincian at 07/27/21 1630   acetaminophen (TYLENOL) tablet 650 mg, 650 mg, Oral, Q4H PRN, Jennye Boroughs, MD, 650 mg at 07/28/21 0006   albumin human 25 % solution 25 g, 25 g, Intravenous, Q8H, Amin, Soundra Pilon, MD, Last Rate: 60 mL/hr at 07/28/21 0544, 25 g at 07/28/21 0544   alum & mag hydroxide-simeth (MAALOX/MYLANTA) 200-200-20 MG/5ML suspension 15 mL, 15 mL, Oral, Q4H PRN, Jennye Boroughs, MD, 15 mL at 07/24/21 0545   Chlorhexidine Gluconate Cloth 2 % PADS 6 each, 6 each, Topical, Daily, Jennye Boroughs, MD, 6 each at 09/62/83 6629   folic acid (FOLVITE) tablet 1 mg, 1 mg, Oral, Daily, Jennye Boroughs, MD, 1 mg at 07/26/21 4765   furosemide (LASIX) 200 mg in dextrose 5 % 100 mL (2 mg/mL) infusion, 6 mg/hr, Intravenous, Continuous, Breeze, Benancio Deeds, NP,  Last Rate: 3 mL/hr at 07/27/21 0857, 6 mg/hr at 07/27/21 0857   influenza vac split quadrivalent PF (FLUARIX) injection 0.5 mL, 0.5 mL, Intramuscular, Tomorrow-1000, Ayiku, Bernard, MD   insulin aspart (novoLOG) injection 0-6 Units, 0-6 Units, Subcutaneous, TID WC, Jennye Boroughs, MD, 3 Units at 07/25/21 1239   lactulose ([place]) 10 GM/15ML solution 20 g, 20 g, Oral, BID PRN, Jennye Boroughs, MD, 20 g at 07/27/21 1647   liver oil-zinc oxide (DESITIN) 40 % ointment, , Topical, See admin instructions, Jennye Boroughs, MD, 1 application at 51/02/58 0541   midodrine (PROAMATINE) tablet 5 mg, 5 mg, Oral, BID WC, Beers, Brandon D, RPH, 5 mg at 07/27/21 1647   nitroGLYCERIN (NITROSTAT) SL tablet 0.4 mg, 0.4 mg, Sublingual, Q5 min PRN, Lorella Nimrod, MD   ondansetron (ZOFRAN) injection 4 mg, 4 mg, Intravenous, Q6H PRN, Jennye Boroughs, MD, 4 mg at 07/28/21 0018   pantoprazole (PROTONIX) injection 40 mg, 40 mg, Intravenous, Q12H, Jonathon Bellows, MD, 40 mg at 07/27/21 2236   rifaximin (XIFAXAN) tablet 550 mg, 550 mg, Oral, BID, Jennye Boroughs, MD, 550 mg at 07/27/21 2100   sodium chloride flush (NS) 0.9 % injection 10-40 mL, 10-40 mL, Intracatheter, Q12H, Jennye Boroughs, MD, 10 mL at 07/27/21 2241   sodium chloride flush (NS) 0.9 % injection 10-40 mL, 10-40 mL, Intracatheter, PRN, Jennye Boroughs, MD   tiotropium (SPIRIVA) inhalation capsule ([facility] use ONLY) 18 mcg, 1 capsule, Inhalation, Daily, Jennye Boroughs, MD, 18 mcg at 07/27/21 1030   traMADol (ULTRAM) tablet 50 mg, 50 mg, Oral, Q6H PRN, Lorella Nimrod, MD, 50 mg at 07/28/21 0006  sodium chloride Stopped (07/26/21 0928)   sodium chloride Stopped (07/27/21 1630)   albumin human 25 g (07/28/21 0544)   furosemide (LASIX) 200 mg in dextrose 5% 100 mL (2mg /mL) infusion 6 mg/hr (07/27/21 0857)    acetaminophen, alum & mag hydroxide-simeth, lactulose, nitroGLYCERIN, ondansetron (ZOFRAN) IV, sodium chloride flush, traMADol   Allergies  Allergen Reactions    Lactose Intolerance (Gi) Diarrhea   Allergies were reviewed by me prior to the procedure  Objective    Vitals:   07/28/21 0311 07/28/21 0443 07/28/21 0736 07/28/21 1113  BP:  104/61 109/64 124/69  Pulse:  78 76 83  Resp:  16 18 18   Temp:  (!) 97.1 F (36.2 C) 98.3 F (36.8 C) (!) 97.5 F (36.4 C)  TempSrc:   Oral Oral  SpO2:  100% 99% 100%  Weight: 97 kg     Height:         Physical Exam Vitals and nursing note reviewed.  Constitutional:      General: She is not in acute distress.    Appearance: She is obese. She is ill-appearing (chronically). She is not toxic-appearing or diaphoretic.  HENT:     Head: Normocephalic and atraumatic.     Nose: Nose normal.  Eyes:     General: No scleral icterus. Cardiovascular:     Rate and Rhythm: Normal rate and regular rhythm.  Pulmonary:     Effort: Pulmonary effort is normal.     Breath sounds: Normal breath sounds.  Abdominal:     General: Bowel sounds are normal. There is distension.     Tenderness: There is no abdominal tenderness. There is no guarding or rebound.  Musculoskeletal:     Cervical back: Neck supple.     Comments: BLE amptuation  Skin:    General: Skin is dry.     Coloration: Skin is not pale.  Neurological:  Mental Status: She is alert and oriented to person, place, and time. Mental status is at baseline.     Assessment:  Ms. [name] is a 55 y.o. female  who presents today for Esophagogastroduodenoscopy for upper gastrointestinal bleeding.  Plan:  Esophagogastroduodenoscopy with possible intervention today  Esophagogastroduodenoscopy with possible biopsy, control of bleeding, polypectomy, and interventions as necessary has been discussed with the patient/patient representative. Informed consent was obtained from the patient/patient representative after explaining the indication, nature, and risks of the procedure including but not limited to death, bleeding, perforation, missed neoplasm/lesions,  cardiorespiratory compromise, and reaction to medications. Opportunity for questions was given and appropriate answers were provided. Patient/patient representative has verbalized understanding is amenable to undergoing the procedure.   Annamaria Helling, DO  [facility]  Portions of the record may have been created with voice recognition software. Occasional wrong-word or 'sound-a-like' substitutions may have occurred due to the inherent limitations of voice recognition software.  Read the chart carefully and recognize, using context, where substitutions may have occurred.

## 2021-07-28 NOTE — Anesthesia Preprocedure Evaluation (Addendum)
Anesthesia Evaluation   Patient awake    Reviewed: Allergy & Precautions, NPO status , Patient's Chart, lab work & pertinent test results, reviewed documented beta blocker date and time   History of Anesthesia Complications (+) DIFFICULT AIRWAY and Family history of anesthesia reactionNegative for: history of anesthetic complications  Airway Mallampati: III  TM Distance: >3 FB Neck ROM: Full    Dental  (+) Dental Advisory Given, Poor Dentition, Chipped, Missing,    Pulmonary COPD, Current Smoker and Patient abstained from smoking.,  Closed fracture of one rib of right side with routine healing   Pulmonary exam normal breath sounds clear to auscultation       Cardiovascular Exercise Tolerance: Poor hypertension, Pt. on medications and Pt. on home beta blockers + CAD, + Past MI, + Peripheral Vascular Disease and +CHF  Normal cardiovascular exam+ dysrhythmias Atrial Fibrillation + Valvular Problems/Murmurs (mild) MR  Rhythm:Irregular Rate:Normal   Echo on 07/22/2021 showed LVEF 25-30% with global hypokinesis.  Started on Midodrine this admission for borderline hypotension after hemorrhagic shock   Neuro/Psych Vision Loss  Neuromuscular disease negative psych ROS   GI/Hepatic GERD  Medicated,(+) Cirrhosis       , GI bleed   Endo/Other  diabetes, Poorly Controlled, Type 2, Insulin Dependent  Renal/GU ARFRenal disease (AKI with CKD stage IIIb)Acute kidney injury with possible hepatorenal syndrome secondary  to liver cirrhosis and chronic systolic heart failure. On lasix, midodrine, and albumin.     Musculoskeletal  (+) Arthritis , Osteoarthritis,    Abdominal (+) + obese,   Peds  Hematology  (+) Blood dyscrasia (s/p pRBC transfusion), anemia ,   Anesthesia Other Findings Pt with anasarca Left AKA, R BKA   Abnormal stress test   Arthritis  Asthma    Carotid arterial disease ([place])  a. 09/2017 Carotid U/S: 40-49%  bilat ICA stenosis. Chronic back pain    Coronary artery calcification seen on CT scan  a. 11/2017 CT Abd/Pelvis: Multi vessel coronary vascular Ca2+.  Depression    Diabetes mellitus    Diabetic neuropathy ([place])    Difficult intubation  DIFFICULT AIRWAY/FYI  Family history of adverse reaction to anesthesia  mother had difficlty waking   Femoral-popliteal bypass graft occlusion, left ([place]) 12/02/2017   GERD (gastroesophageal reflux disease)  Hyperlipidemia    Ischemic cardiomyopathy 1. Left ventricular ejection fraction, by estimation, is 25 to 30%. The  left ventricle has severely decreased function.  . Mild MR. Mildly dil LA, mod dil RV w/ mod red RV fxn, mild TR, PASP 51mmHg. NSTEMI (non-ST elevated myocardial infarction) ([place])  a. 05/2018 in setting of Afib, sepsis, and post-op L AKA. Peak trop 7.7. EF 30-35% by echo-->cath not performed 2/2 renal failure.  Osteomyelitis of right fibula ([place]) 03/05/2017 PAD (peripheral artery disease) ([place])  a. S/p L fem-pop bypass; b. 11/2017 s/p Aortobifem bypass 2/2 graft occlusion; c. 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  Paroxysmal atrial fibrillation with rapid ventricular response ([place]) 12/02/2017 a. CHA2DS2VASc = 3-->Xarelto; b. 05/2018 Recurrent Afib-->amio.  Renal insufficiency    Ulcer  Foot   Cleared by cardiology for EGD 07-24-21  Reproductive/Obstetrics                         Anesthesia Physical  Anesthesia Plan  ASA: 4  Anesthesia Plan: General   Post-op Pain Management:    Induction: Intravenous  PONV Risk Score and Plan: 2  Airway Management Planned: Oral ETT  Additional Equipment:   Intra-op Plan:   Post-operative Plan: Possible Post-op intubation/ventilation  Informed Consent: I have reviewed the patients History and Physical, chart, labs and discussed the procedure including the risks, benefits and alternatives for the proposed anesthesia with the patient or  authorized representative who has indicated his/her understanding and acceptance.   Patient has DNR.  Discussed DNR with patient.   Dental advisory given  Plan Discussed with: CRNA, Surgeon and Anesthesiologist  Anesthesia Plan Comments: (Pt does not want chest compressions but did not comment if she is okay with defibrillation/cardioversion. She agrees to intubation. )     Anesthesia Quick Evaluation There were no vitals filed for this visit. There is no height or weight on file to calculate BMI.

## 2021-07-28 NOTE — Progress Notes (Addendum)
[facility]  ROUNDING NOTE   Subjective:   [name] is a 55 y.o.  female with a past medical history of hypertension, Type 2 diabetes, atrial fib, ischemic myopathy and chronic kidney disease. She has been admitted for Acute pulmonary edema (HCC) [J81.0] Elevated troponin [R77.8] Acute renal failure (ARF) (HCC) [N17.9] Acute kidney injury ([place]) [N17.9] Acute on chronic systolic (congestive) heart failure (HCC) [I50.23] Closed fracture of one rib of right side with routine healing, subsequent encounter [S22.31XD]  Patient is resting comfortably in the bed Patient main complaint in today visit was that she wanted to go home    Objective:  Vital signs in last 24 hours:  Temp:  [96.7 F (35.9 C)-98.3 F (36.8 C)] 98.3 F (36.8 C) (09/10 0736) Pulse Rate:  [72-85] 76 (09/10 0736) Resp:  [0-19] 18 (09/10 0736) BP: (93-128)/(35-83) 109/64 (09/10 0736) SpO2:  [96 %-100 %] 99 % (09/10 0736) Weight:  [97 kg] 97 kg (09/10 0311)  Weight change: -1 kg Filed Weights   07/25/21 0626 07/27/21 0500 07/28/21 0311  Weight: 98.8 kg 98 kg 97 kg    Intake/Output: I/O last 3 completed shifts: In: 566.6 [P.O.:100; I.V.:187.2; IV Piggyback:279.4] Out: 1225 [Urine:1225]   Intake/Output this shift:  No intake/output data recorded.  Physical Exam: General: NAD, laying in bed  Head: Normocephalic, atraumatic. Moist oral mucosal membranes  Eyes: Anicteric, legally blind  Lungs:  Clear to auscultation, [place] O2  Heart: Regular rate and rhythm  Abdomen:  firm, nontender, distended,abdominal wall edema present   Extremities:  2+ peripheral edema.  Neurologic: Nonfocal, moving all four extremities  Skin: No lesions       Basic Metabolic Panel: Recent Labs  Lab 07/23/21 0621 07/23/21 2133 07/24/21 0419 07/25/21 0507 07/26/21 0859 07/27/21 0635 07/28/21 0439  NA 134*  --  135 133* 130* 131* 134*  K 4.4  --  4.2 4.2 4.1 4.3 4.2  CL 104  --  103 102 97* 100 102  CO2  21*  --  23 21* 21* 22 22  GLUCOSE 85  --  80 99 129* 104* 77  BUN 64*  --  63* 66* 67* 71* 76*  CREATININE 3.98*  --  3.86* 3.71* 4.24* 3.97* 4.00*  CALCIUM 7.4*  --  7.7* 7.6* 7.6* 7.7* 7.7*  MG 1.6* 1.7 2.1  --   --   --   --   PHOS 8.6*  --  8.3*  --  7.7* 7.6* 7.8*    Liver Function Tests: Recent Labs  Lab 07/22/21 0435 07/23/21 0621 07/26/21 0859 07/27/21 0635 07/28/21 0439  AST 10* 14*  --   --   --   ALT 8 8  --   --   --   ALKPHOS 80 71  --   --   --   BILITOT 0.6 1.0  --   --   --   PROT 5.8* 5.9*  --   --   --   ALBUMIN 2.7* 2.7* 3.0* 3.4* 3.7   No results for input(s): LIPASE, AMYLASE in the last 168 hours. Recent Labs  Lab 07/25/21 0648  AMMONIA 28    CBC: Recent Labs  Lab 07/23/21 0621 07/23/21 2133 07/24/21 0419 07/25/21 0507 07/28/21 0439  WBC 11.1* 10.1 9.3 9.5 8.2  NEUTROABS  --   --   --  8.2*  --   HGB 8.8* 10.5* 10.0* 10.1* 9.3*  HCT 28.7* 32.2* 31.0* 30.9* 29.6*  MCV 85.2 84.1 83.1  83.7 88.1  PLT 175 144* 137* 127* 107*    Cardiac Enzymes: No results for input(s): CKTOTAL, CKMB, CKMBINDEX, TROPONINI in the last 168 hours.  BNP: Invalid input(s): POCBNP  CBG: Recent Labs  Lab 07/26/21 2100 07/27/21 0737 07/27/21 1114 07/27/21 1846 07/27/21 2132  GLUCAP 110* 108* 101* 89 89    Microbiology: Results for orders placed or performed during the hospital encounter of 07/20/21  Resp Panel by RT-PCR (Flu A&B, Covid) Nasopharyngeal Swab     Status: None   Collection Time: 07/20/21  6:14 PM   Specimen: Nasopharyngeal Swab; Nasopharyngeal(NP) swabs in vial transport medium  Result Value Ref Range Status   SARS Coronavirus 2 by RT PCR NEGATIVE NEGATIVE Final    Comment: (NOTE) SARS-CoV-2 target nucleic acids are NOT DETECTED.  The SARS-CoV-2 RNA is generally detectable in upper respiratory specimens during the acute phase of infection. The lowest concentration of SARS-CoV-2 viral copies this assay can detect is 138 copies/mL. A  negative result does not preclude SARS-Cov-2 infection and should not be used as the sole basis for treatment or other patient management decisions. A negative result may occur with  improper specimen collection/handling, submission of specimen other than nasopharyngeal swab, presence of viral mutation(s) within the areas targeted by this assay, and inadequate number of viral copies(<138 copies/mL). A negative result must be combined with clinical observations, patient history, and epidemiological information. The expected result is Negative.  Fact Sheet for Patients:  EntrepreneurPulse.com.au  Fact Sheet for Healthcare Providers:  IncredibleEmployment.be  This test is no t yet approved or cleared by the [place] FDA and  has been authorized for detection and/or diagnosis of SARS-CoV-2 by FDA under an Emergency Use Authorization (EUA). This EUA will remain  in effect (meaning this test can be used) for the duration of the COVID-19 declaration under Section 564(b)(1) of the Act, 21 U.S.C.section 360bbb-3(b)(1), unless the authorization is terminated  or revoked sooner.       Influenza A by PCR NEGATIVE NEGATIVE Final   Influenza B by PCR NEGATIVE NEGATIVE Final    Comment: (NOTE) The Xpert Xpress SARS-CoV-2/FLU/RSV plus assay is intended as an aid in the diagnosis of influenza from Nasopharyngeal swab specimens and should not be used as a sole basis for treatment. Nasal washings and aspirates are unacceptable for Xpert Xpress SARS-CoV-2/FLU/RSV testing.  Fact Sheet for Patients: EntrepreneurPulse.com.au  Fact Sheet for Healthcare Providers: IncredibleEmployment.be  This test is not yet approved or cleared by the [place] FDA and has been authorized for detection and/or diagnosis of SARS-CoV-2 by FDA under an Emergency Use Authorization (EUA). This EUA will remain in effect (meaning this test can  be used) for the duration of the COVID-19 declaration under Section 564(b)(1) of the Act, 21 U.S.C. section 360bbb-3(b)(1), unless the authorization is terminated or revoked.  Performed at [facility], [address] [place], [place] [address]     Coagulation Studies: No results for input(s): LABPROT, INR in the last 72 hours.  Urinalysis: No results for input(s): COLORURINE, LABSPEC, PHURINE, GLUCOSEU, HGBUR, BILIRUBINUR, KETONESUR, PROTEINUR, UROBILINOGEN, NITRITE, LEUKOCYTESUR in the last 72 hours.  Invalid input(s): APPERANCEUR    Imaging: No results found.   Medications:    sodium chloride Stopped (07/26/21 0928)   sodium chloride Stopped (07/27/21 1630)   albumin human 25 g (07/28/21 0544)   furosemide (LASIX) 200 mg in dextrose 5% 100 mL (2mg /mL) infusion 6 mg/hr (07/27/21 0857)    Chlorhexidine Gluconate Cloth  6 each Topical Daily  folic acid  1 mg Oral Daily   influenza vac split quadrivalent PF  0.5 mL Intramuscular Tomorrow-1000   insulin aspart  0-6 Units Subcutaneous TID WC   liver oil-zinc oxide   Topical See admin instructions   midodrine  5 mg Oral BID WC   pantoprazole (PROTONIX) IV  40 mg Intravenous Q12H   rifaximin  550 mg Oral BID   sodium chloride flush  10-40 mL Intracatheter Q12H   tiotropium  1 capsule Inhalation Daily   acetaminophen, alum & mag hydroxide-simeth, lactulose, nitroGLYCERIN, ondansetron (ZOFRAN) IV, sodium chloride flush, traMADol  Assessment/ Plan:  [name] is a 55 y.o.  female with a past medical history of hypertension, Type 2 diabetes, atrial fib, ischemic myopathy and chronic kidney disease. She has been admitted with Acute pulmonary edema (HCC) [J81.0] Elevated troponin [R77.8] Acute renal failure (ARF) (HCC) [N17.9] Acute kidney injury ([place]) [N17.9] Acute on chronic systolic (congestive) heart failure (HCC) [I50.23] Closed fracture of one rib of right side with routine healing, subsequent  encounter [S22.31XD]       1)Renal   Acute kidney injury Patient has AKI secondary to hepatorenal syndrome/cardiorenal syndrome Patient has AKI on CKD Patient has CKD stage IIIb/4. Patient has CKD secondary to diabetes mellitus Patient has CKD since 2018 Patient CKD has been marked with multiple episodes of AKI. Patient has AKI in 2019 with a peak creatinine of 8.1 Patient had another episode of AKI in 2021 with a peak creatinine of 2.8 Patient is now admitted with another episode of AKI Patient creatinine is stable at around 4  2)Hypotension   Blood pressure is stable    3)Anemia of chronic disease and GI bleed  CBC Latest Ref Rng & Units 07/28/2021 07/25/2021 07/24/2021  WBC 4.0 - 10.5 K/uL 8.2 9.5 9.3  Hemoglobin 12.0 - 15.0 g/dL 9.3(L) 10.1(L) 10.0(L)  Hematocrit 36.0 - 46.0 % 29.6(L) 30.9(L) 31.0(L)  Platelets 150 - 400 K/uL 107(L) 127(L) 137(L)       HGb at goal (9--11)   4) Secondary hyperparathyroidism -CKD Mineral-Bone Disorder    Lab Results  Component Value Date   CALCIUM 7.7 (L) 07/28/2021   CAION 1.12 (L) 03/27/2018   PHOS 7.8 (H) 07/28/2021    Secondary Hyperparathyroidism present   Phosphorus is not at goal.   5)GI bleed Patient was supposed to for EGD this morning GI team and primary team is following  6) Electrolytes   BMP Latest Ref Rng & Units 07/28/2021 07/27/2021 07/26/2021  Glucose 70 - 99 mg/dL 77 104(H) 129(H)  BUN 6 - 20 mg/dL 76(H) 71(H) 67(H)  Creatinine 0.44 - 1.00 mg/dL 4.00(H) 3.97(H) 4.24(H)  BUN/Creat Ratio 9 - 23 - - -  Sodium 135 - 145 mmol/L 134(L) 131(L) 130(L)  Potassium 3.5 - 5.1 mmol/L 4.2 4.3 4.1  Chloride 98 - 111 mmol/L 102 100 97(L)  CO2 22 - 32 mmol/L 22 22 21(L)  Calcium 8.9 - 10.3 mg/dL 7.7(L) 7.7(L) 7.6(L)     Sodium Hyponatremia Patient has hypervolemic hyponatremia Patient sodium is improving   Potassium Normokalemic    7)Acid base  Co2 at goal   9)Acute on chronic systolic CHF Patient is  currently in fluid overload Will increase patient's Lasix dose.  Patient is currently getting Lasix 6 mg/h Will increase it to 10 mg/h as patient is still overtly positive-patient has abdominal wall edema I did educate patient possible need for renal replacement therapy soon in case she does not respond to IV diuresis  Plan   I will ask for chest x-ray I will increase the dose of Lasix drip Asking for chest x-ray to help assess the fluid volume as on physical examination patient is an anasarca We will decide about possible need for renal replacement therapy depending upon patient response to increased dose of Lasix   LOS: 8 Dorreen Valiente s Kemberly Taves 9/10/20229:59 AM
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
## 2021-07-28 NOTE — Progress Notes (Addendum)
Patient now agreeable to go for EGD. Voice mail left by this RN for MD Virgina Jock.  Sister at bedside with patient at this time.   MD Virgina Jock returned page. Endo RN to call me if/when patient is able to be placed back on the schedule for today.   Patient to remain NPO at this time.

## 2021-07-28 NOTE — Progress Notes (Signed)
Patient has changed her mind and is now agreeable to undergoing EGD.  Annamaria Helling, DO [facility]

## 2021-07-28 NOTE — Op Note (Signed)
[facility] Gastroenterology Patient Name: [name] Procedure Date: 07/28/2021 8:33 AM MRN: [id number] Account #: [id number] Date of Birth: 12-25-1965 Admit Type: Inpatient Age: 55 Room: [facility] ENDO ROOM 4 Gender: Female Note Status: Finalized Instrument Name: Upper Endoscope [id number] Procedure:             Upper GI endoscopy Indications:           Acute post hemorrhagic anemia Providers:             Annamaria Helling DO, DO Medicines:             General Anesthesia Complications:         No immediate complications. Estimated blood loss: None. Procedure:             Pre-Anesthesia Assessment:                        - Prior to the procedure, a History and Physical was                         performed, and patient medications and allergies were                         reviewed. The patient is competent. The risks and                         benefits of the procedure and the sedation options and                         risks were discussed with the patient. All questions                         were answered and informed consent was obtained.                         Patient identification and proposed procedure were                         verified by the physician, the nurse, the anesthetist                         and the technician in the endoscopy suite. Mental                         Status Examination: alert and oriented. Airway                         Examination: small/crowded oropharyngeal airway.                         Respiratory Examination: clear to auscultation. CV                         Examination: regular rate and rhythm. Prophylactic                         Antibiotics: The patient does not require prophylactic                         antibiotics. Prior Anticoagulants:  The patient has                         taken no previous anticoagulant or antiplatelet                         agents. ASA Grade Assessment: III - A patient with                          severe systemic disease. After reviewing the risks and                         benefits, the patient was deemed in satisfactory                         condition to undergo the procedure. The anesthesia                         plan was to use general anesthesia. Immediately prior                         to administration of medications, the patient was                         re-assessed for adequacy to receive sedatives. The                         heart rate, respiratory rate, oxygen saturations,                         blood pressure, adequacy of pulmonary ventilation, and                         response to care were monitored throughout the                         procedure. The physical status of the patient was                         re-assessed after the procedure.                        After obtaining informed consent, the endoscope was                         passed under direct vision. Throughout the procedure,                         the patient's blood pressure, pulse, and oxygen                         saturations were monitored continuously. The                         Endosonoscope was introduced through the mouth, and                         advanced to the second part of duodenum. The upper GI  endoscopy was accomplished without difficulty. The                         patient tolerated the procedure well. Findings:      The duodenal bulb, first portion of the duodenum and second portion of       the duodenum were normal. Estimated blood loss: none.      Esophagogastric landmarks were identified: the gastroesophageal junction       was found at 35 cm from the incisors. Mild esophagitis likely related to       food and reflux noted      Food was found in the lower third of the esophagus and suctioned out as       best possible. Estimated blood loss: none. Otherwise esophageal mucosa       appeared normal.      A large amount of food  (residue) was found on the greater curvature of       the stomach. Estimated blood loss: none. Under the food, which was       attempted to be suctions and maneuvered, there is an Ulceration       (biopsied yesterday so new biopsies were not taken). Towards the center       and underlying the food, there is appearingly an eschar/blackened area.       It is unclear if this is due to prolonged food stasis versus ischemia.       Unforutnately, the food residue was not able to be completely cleared to       examine the extent of the ulcer/eschar. Estimated blood loss: none. Impression:            - Normal duodenal bulb, first portion of the duodenum                         and second portion of the duodenum.                        - Esophagogastric landmarks identified.                        - Food in the lower third of the esophagus.                        - A large amount of food (residue) in the stomach.                        - No specimens collected. Recommendation:        - Return patient to hospital ward for ongoing care.                        - Clear liquid diet.                        - Continue present medications.                        - Use Protonix (pantoprazole) 40 mg PO BID                         indefinitely.                        -  Repeat upper endoscopy in 6 weeks to check healing                         with Dr. Vicente Males.                        - The findings and recommendations were discussed with                         the patient's primary physician. Procedure Code(s):     --- Professional ---                        [phone], Esophagogastroduodenoscopy, flexible,                         transoral; diagnostic, including collection of                         specimen(s) by brushing or washing, when performed                         (separate procedure) Diagnosis Code(s):     --- Professional ---                        H06.237S, Food in esophagus causing other injury,                          initial encounter                        D62, Acute posthemorrhagic anemia CPT copyright 2019 American Medical Association. All rights reserved. The codes documented in this report are preliminary and upon coder review may  be revised to meet current compliance requirements. Attending Participation:      I personally performed the entire procedure. Volney American, DO Annamaria Helling DO, DO 07/28/2021 12:27:37 PM This report has been signed electronically. Number of Addenda: 0 Note Initiated On: 07/28/2021 8:33 AM Estimated Blood Loss:  Estimated blood loss: none.      [facility]

## 2021-07-28 NOTE — Progress Notes (Signed)
Patient recovering in PACU Attempted to call patient's husband/point of contact listed in epic to update on procedure, but received voicemail  Discussed case with hospitalist. Consider vascular consult given black eschar like area in stomach concerning for ischemia vs. Food stasis ulcer. Pt reportedly has not wanted any further interventions. Consider goals of care at this point if this is the case. For further information- see egd report in epic No further planned GI intervention at this time.  GI to sign off. Available as needed. Please do not hesitate to call regarding questions or concerns.  Annamaria Helling, DO [facility]- Gastroenterology

## 2021-07-28 NOTE — Anesthesia Postprocedure Evaluation (Signed)
Anesthesia Post Note  Patient: [name]  Procedure(s) Performed: ESOPHAGOGASTRODUODENOSCOPY (EGD) WITH PROPOFOL  Patient location during evaluation: PACU Anesthesia Type: General Level of consciousness: awake and alert Pain management: pain level controlled Vital Signs Assessment: post-procedure vital signs reviewed and stable Respiratory status: spontaneous breathing, nonlabored ventilation, respiratory function stable and patient connected to nasal cannula oxygen Cardiovascular status: blood pressure returned to baseline and stable Postop Assessment: no apparent nausea or vomiting Anesthetic complications: no   No notable events documented.   Last Vitals:  Vitals:   07/28/21 1315 07/28/21 1330  BP: 101/68 107/67  Pulse: 84 82  Resp: (!) 8 (!) 9  Temp:  36.5 C  SpO2: 96% 93%    Last Pain:  Vitals:   07/28/21 1330  TempSrc:   PainSc: Asleep                 [place] Ouch

## 2021-07-28 NOTE — Anesthesia Procedure Notes (Signed)
Procedure Name: Intubation Date/Time: 07/28/2021 12:04 PM Performed by: Orion Crook, CRNA Pre-anesthesia Checklist: Patient identified, Emergency Drugs available, Suction available and Patient being monitored Patient Re-evaluated:Patient Re-evaluated prior to induction Oxygen Delivery Method: Circle system utilized Preoxygenation: Pre-oxygenation with 100% oxygen Induction Type: IV induction, Rapid sequence and Cricoid Pressure applied Laryngoscope Size: Glidescope Grade View: Grade I Tube type: Oral Tube size: 7.0 mm Number of attempts: 1 Airway Equipment and Method: Stylet Placement Confirmation: ETT inserted through vocal cords under direct vision, positive ETCO2 and breath sounds checked- equal and bilateral Secured at: 22 cm Tube secured with: Tape Dental Injury: Teeth and Oropharynx as per pre-operative assessment

## 2021-07-28 NOTE — Progress Notes (Signed)
[facility] Gastroenterology Inpatient Follow-up/Progress Note   Patient ID: [name] is a 55 y.o. female.  Overnight Events / Subjective Findings Unfortunately the patient is refusing upper endoscopy at this point in time and demanding to be be sent home.  Her hemoglobin did decrease 1 g overnight, but no overt signs of GI bleeding per nursing.  She has remained hemodynamically stable.  Patient verbalizes understanding that if EGD is not performed she can potentially lead to death, but still declines undergoing procedure  Review of Systems  Reason unable to perform ROS: patient not wanting to participate in interview/exam.    Medications  Current Facility-Administered Medications:    0.9 %  sodium chloride infusion, 250 mL, Intravenous, Continuous, Sharion Settler, NP, Stopped at 07/26/21 0928   0.9 %  sodium chloride infusion, , Intravenous, Continuous, Amin, Soundra Pilon, MD, Stopping Infusion hung by another clincian at 07/27/21 1630   acetaminophen (TYLENOL) tablet 650 mg, 650 mg, Oral, Q4H PRN, Jennye Boroughs, MD, 650 mg at 07/28/21 0006   albumin human 25 % solution 25 g, 25 g, Intravenous, Q8H, Amin, Soundra Pilon, MD, Last Rate: 60 mL/hr at 07/28/21 0544, 25 g at 07/28/21 0544   alum & mag hydroxide-simeth (MAALOX/MYLANTA) 200-200-20 MG/5ML suspension 15 mL, 15 mL, Oral, Q4H PRN, Jennye Boroughs, MD, 15 mL at 07/24/21 0545   Chlorhexidine Gluconate Cloth 2 % PADS 6 each, 6 each, Topical, Daily, Jennye Boroughs, MD, 6 each at 29/79/89 2119   folic acid (FOLVITE) tablet 1 mg, 1 mg, Oral, Daily, Jennye Boroughs, MD, 1 mg at 07/26/21 4174   furosemide (LASIX) 200 mg in dextrose 5 % 100 mL (2 mg/mL) infusion, 6 mg/hr, Intravenous, Continuous, Breeze, Shantelle, NP, Last Rate: 3 mL/hr at 07/27/21 0857, 6 mg/hr at 07/27/21 0857   influenza vac split quadrivalent PF (FLUARIX) injection 0.5 mL, 0.5 mL, Intramuscular, Tomorrow-1000, [place], Ilona Sorrel, MD   insulin aspart (novoLOG) injection 0-6 Units, 0-6  Units, Subcutaneous, TID WC, Jennye Boroughs, MD, 3 Units at 07/25/21 1239   lactulose ([place]) 10 GM/15ML solution 20 g, 20 g, Oral, BID PRN, Jennye Boroughs, MD, 20 g at 07/27/21 1647   liver oil-zinc oxide (DESITIN) 40 % ointment, , Topical, See admin instructions, Jennye Boroughs, MD, 1 application at 07/02/47 0541   midodrine (PROAMATINE) tablet 5 mg, 5 mg, Oral, BID WC, Beers, Brandon D, RPH, 5 mg at 07/27/21 1647   nitroGLYCERIN (NITROSTAT) SL tablet 0.4 mg, 0.4 mg, Sublingual, Q5 min PRN, Lorella Nimrod, MD   ondansetron (ZOFRAN) injection 4 mg, 4 mg, Intravenous, Q6H PRN, Jennye Boroughs, MD, 4 mg at 07/28/21 0018   pantoprazole (PROTONIX) injection 40 mg, 40 mg, Intravenous, Q12H, Jonathon Bellows, MD, 40 mg at 07/27/21 2236   rifaximin (XIFAXAN) tablet 550 mg, 550 mg, Oral, BID, Jennye Boroughs, MD, 550 mg at 07/27/21 2100   sodium chloride flush (NS) 0.9 % injection 10-40 mL, 10-40 mL, Intracatheter, Q12H, Jennye Boroughs, MD, 10 mL at 07/27/21 2241   sodium chloride flush (NS) 0.9 % injection 10-40 mL, 10-40 mL, Intracatheter, PRN, Jennye Boroughs, MD   tiotropium (SPIRIVA) inhalation capsule ([facility] use ONLY) 18 mcg, 1 capsule, Inhalation, Daily, Jennye Boroughs, MD, 18 mcg at 07/27/21 1030   traMADol (ULTRAM) tablet 50 mg, 50 mg, Oral, Q6H PRN, Lorella Nimrod, MD, 50 mg at 07/28/21 0006  sodium chloride Stopped (07/26/21 0928)   sodium chloride Stopped (07/27/21 1630)   albumin human 25 g (07/28/21 0544)   furosemide (LASIX) 200 mg in dextrose 5% 100 mL (2mg /mL)  infusion 6 mg/hr (07/27/21 0857)    acetaminophen, alum & mag hydroxide-simeth, lactulose, nitroGLYCERIN, ondansetron (ZOFRAN) IV, sodium chloride flush, traMADol   Objective    Vitals:   07/27/21 1945 07/28/21 0311 07/28/21 0443 07/28/21 0736  BP: 103/60  104/61 109/64  Pulse: 79  78 76  Resp: 16  16 18   Temp: 98.3 F (36.8 C)  (!) 97.1 F (36.2 C) 98.3 F (36.8 C)  TempSrc: Oral   Oral  SpO2: 96%  100% 99%  Weight:  97 kg     Height:        Physical Exam Vitals and nursing note reviewed.  Constitutional:      General: She is not in acute distress.    Appearance: She is obese. She is ill-appearing (chronically). She is not toxic-appearing or diaphoretic.     Comments: Patient refuses physical exam  HENT:     Head: Normocephalic and atraumatic.  Eyes:     Extraocular Movements: Extraocular movements intact.  Pulmonary:     Effort: Pulmonary effort is normal.  Neurological:     Mental Status: She is alert and oriented to person, place, and time.  Psychiatric:        Behavior: Behavior is agitated.     Laboratory Data Recent Labs  Lab 07/24/21 0419 07/25/21 0507 07/28/21 0439  WBC 9.3 9.5 8.2  HGB 10.0* 10.1* 9.3*  HCT 31.0* 30.9* 29.6*  PLT 137* 127* 107*  NEUTOPHILPCT  --  86  --   LYMPHOPCT  --  8  --   MONOPCT  --  5  --   EOSPCT  --  1  --    Recent Labs  Lab 07/22/21 0435 07/23/21 0621 07/24/21 0419 07/26/21 0859 07/27/21 0635 07/28/21 0439  NA 135 134*   < > 130* 131* 134*  K 4.5 4.4   < > 4.1 4.3 4.2  CL 103 104   < > 97* 100 102  CO2 23 21*   < > 21* 22 22  BUN 57* 64*   < > 67* 71* 76*  CREATININE 4.15* 3.98*   < > 4.24* 3.97* 4.00*  CALCIUM 7.2* 7.4*   < > 7.6* 7.7* 7.7*  PROT 5.8* 5.9*  --   --   --   --   BILITOT 0.6 1.0  --   --   --   --   ALKPHOS 80 71  --   --   --   --   ALT 8 8  --   --   --   --   AST 10* 14*  --   --   --   --   GLUCOSE 79 85   < > 129* 104* 77   < > = values in this interval not displayed.   No results for input(s): INR in the last 168 hours.    Imaging Studies: No results found.  Assessment:  # UGIB  - EGD by Dr. Vicente Males 07/27/21 concerning for ulceration under  large amount or residual food # anemia 2/2 above  - down 1 gram from yesterday, but VSS # A fib on xarelto # AKI # CHF - EF 35% 2/2 ICM # Acute encephalopathy   Plan:  EGD was planned for today, but patient currently refusing. I discussed this with the patient and the  risk of potentially bleeding to death if procedure not performed and adequately treated. Patient verbalized understanding and still declines procedure.  Continue ppi Continue to  monitor h&h; transfusion and resuscitation as per primary team Continue xifaxin and lactulose Xarelto currently benig held Pt refused physical exam Discussed case with nursing staff  Supportive care as per primary team  I personally performed the service.  Management of other medical comorbidities as per primary team  Thank you for allowing [place] to participate in this patient's care. Please don't hesitate to call if any questions or concerns arise.   Annamaria Helling, DO [facility] Gastroenterology  Portions of the record may have been created with voice recognition software. Occasional wrong-word or 'sound-a-like' substitutions may have occurred due to the inherent limitations of voice recognition software.  Read the chart carefully and recognize, using context, where substitutions may have occurred.

## 2021-07-28 NOTE — Progress Notes (Signed)
Patient resting in bed. Refuses patient care, CBG check etc. Refusing for EGD unless she can be guaranteed to go home today. MD Reesa Chew made aware of patient's request at this time. EGD RN aware of patient's decisions at this time and plans to discuss with MD regarding EGD.

## 2021-07-28 NOTE — Interval H&P Note (Signed)
History and Physical Interval Note: Preprocedure H&P from 07/28/21  was reviewed and there was no interval change after seeing and examining the patient.  Written consent was obtained from the patient after discussion of risks, benefits, and alternatives. Patient has consented to proceed with Esophagogastroduodenoscopy with possible intervention   07/28/2021 11:53 AM  [name]  has presented today for surgery, with the diagnosis of GI bleed.  The various methods of treatment have been discussed with the patient and family. After consideration of risks, benefits and other options for treatment, the patient has consented to  Procedure(s): ESOPHAGOGASTRODUODENOSCOPY (EGD) WITH PROPOFOL (N/A) as a surgical intervention.  The patient's history has been reviewed, patient examined, no change in status, stable for surgery.  I have reviewed the patient's chart and labs.  Questions were answered to the patient's satisfaction.     Annamaria Helling

## 2021-07-28 NOTE — Transfer of Care (Signed)
Immediate Anesthesia Transfer of Care Note  Patient: [name]  Procedure(s) Performed: ESOPHAGOGASTRODUODENOSCOPY (EGD) WITH PROPOFOL  Patient Location: PACU  Anesthesia Type:General  Level of Consciousness: awake and patient cooperative  Airway & Oxygen Therapy: Patient Spontanous Breathing and Patient connected to nasal cannula oxygen  Post-op Assessment: Report given to RN and Post -op Vital signs reviewed and stable  Post vital signs: Reviewed and stable  Last Vitals:  Vitals Value Taken Time  BP    Temp    Pulse 86 07/28/21 1237  Resp 12 07/28/21 1237  SpO2 92 % 07/28/21 1237  Vitals shown include unvalidated device data.  Last Pain:  Vitals:   07/28/21 1113  TempSrc: Oral  PainSc:       Patients Stated Pain Goal: 0 (54/62/70 3500)  Complications: No notable events documented.

## 2021-07-29 LAB — RENAL FUNCTION PANEL
Albumin: 4.2 g/dL (ref 3.5–5.0)
Anion gap: 11 (ref 5–15)
BUN: 76 mg/dL — ABNORMAL HIGH (ref 6–20)
CO2: 23 mmol/L (ref 22–32)
Calcium: 8.3 mg/dL — ABNORMAL LOW (ref 8.9–10.3)
Chloride: 101 mmol/L (ref 98–111)
Creatinine, Ser: 4.14 mg/dL — ABNORMAL HIGH (ref 0.44–1.00)
GFR, Estimated: 12 mL/min — ABNORMAL LOW (ref >60)
Glucose, Bld: 120 mg/dL — ABNORMAL HIGH (ref 70–99)
Phosphorus: 8.5 mg/dL — ABNORMAL HIGH (ref 2.5–4.6)
Potassium: 4.7 mmol/L (ref 3.5–5.1)
Sodium: 135 mmol/L (ref 135–145)

## 2021-07-29 LAB — GLUCOSE, CAPILLARY
Glucose-Capillary: 120 mg/dL — ABNORMAL HIGH (ref 70–99)
Glucose-Capillary: 109 mg/dL — ABNORMAL HIGH (ref 70–99)
Glucose-Capillary: 94 mg/dL (ref 70–99)

## 2021-07-29 MED ORDER — ALBUMIN HUMAN 25 % IV SOLN
25.0000 g | Freq: Three times a day (TID) | INTRAVENOUS | Status: DC
  Administered 2021-07-29 – 2021-07-30 (×4): 25 g via INTRAVENOUS
  Filled 2021-07-29 (×5): qty 100

## 2021-07-29 MED ORDER — DEXTROSE 5 % IV SOLN
500.0000 mg | Freq: Once | INTRAVENOUS | Status: AC
  Administered 2021-07-29: 500 mg via INTRAVENOUS
  Filled 2021-07-29: qty 500

## 2021-07-29 NOTE — Progress Notes (Signed)
[facility]  ROUNDING NOTE   Subjective:   [name] is a 55 y.o.  female with a past medical history of hypertension, Type 2 diabetes, atrial fib, ischemic myopathy and chronic kidney disease. She has been admitted for Acute pulmonary edema (HCC) [J81.0] Elevated troponin [R77.8] Acute renal failure (ARF) (HCC) [N17.9] Acute kidney injury ([place]) [N17.9] Acute on chronic systolic (congestive) heart failure (HCC) [I50.23] Closed fracture of one rib of right side with routine healing, subsequent encounter [S22.31XD]  Patient is lethargic but arousable.  Patient had received benzodiazepine last night. Patient's family was present in the room. I did discuss with the patient and her family about her kidney related issues    Objective:  Vital signs in last 24 hours:  Temp:  [97.4 F (36.3 C)-98.4 F (36.9 C)] 98.4 F (36.9 C) (09/11 0747) Pulse Rate:  [81-90] 88 (09/11 0747) Resp:  [8-20] 18 (09/11 0747) BP: (98-128)/(55-77) 115/68 (09/11 0747) SpO2:  [92 %-100 %] 97 % (09/11 0747) Weight:  [101.4 kg] 101.4 kg (09/10 2357)  Weight change: 4.424 kg Filed Weights   07/27/21 0500 07/28/21 0311 07/28/21 2357  Weight: 98 kg 97 kg 101.4 kg    Intake/Output: I/O last 3 completed shifts: In: 991.9 [P.O.:240; I.V.:536.3; IV Piggyback:215.7] Out: 750 [Urine:750]   Intake/Output this shift:  No intake/output data recorded.  Physical Exam: General: NAD, laying in bed  Head: Normocephalic, atraumatic. Moist oral mucosal membranes  Eyes: Anicteric, legally blind  Lungs:  Clear to auscultation, [place] O2  Heart: Regular rate and rhythm  Abdomen:  firm, nontender, distended,abdominal wall edema present   Extremities:  2+ peripheral edema.  Neurologic: Nonfocal, moving all four extremities  Skin: No lesions       Basic Metabolic Panel: Recent Labs  Lab 07/23/21 0621 07/23/21 2133 07/24/21 0419 07/25/21 0507 07/26/21 0859 07/27/21 0635 07/28/21 0439  07/29/21 0528  NA 134*  --  135 133* 130* 131* 134* 135  K 4.4  --  4.2 4.2 4.1 4.3 4.2 4.7  CL 104  --  103 102 97* 100 102 101  CO2 21*  --  23 21* 21* 22 22 23   GLUCOSE 85  --  80 99 129* 104* 77 120*  BUN 64*  --  63* 66* 67* 71* 76* 76*  CREATININE 3.98*  --  3.86* 3.71* 4.24* 3.97* 4.00* 4.14*  CALCIUM 7.4*  --  7.7* 7.6* 7.6* 7.7* 7.7* 8.3*  MG 1.6* 1.7 2.1  --   --   --   --   --   PHOS 8.6*  --  8.3*  --  7.7* 7.6* 7.8* 8.5*    Liver Function Tests: Recent Labs  Lab 07/23/21 0621 07/26/21 0859 07/27/21 0635 07/28/21 0439 07/29/21 0528  AST 14*  --   --   --   --   ALT 8  --   --   --   --   ALKPHOS 71  --   --   --   --   BILITOT 1.0  --   --   --   --   PROT 5.9*  --   --   --   --   ALBUMIN 2.7* 3.0* 3.4* 3.7 4.2   No results for input(s): LIPASE, AMYLASE in the last 168 hours. Recent Labs  Lab 07/25/21 0648  AMMONIA 28    CBC: Recent Labs  Lab 07/23/21 0621 07/23/21 2133 07/24/21 0419 07/25/21 0507 07/28/21 0439  WBC 11.1* 10.1  9.3 9.5 8.2  NEUTROABS  --   --   --  8.2*  --   HGB 8.8* 10.5* 10.0* 10.1* 9.3*  HCT 28.7* 32.2* 31.0* 30.9* 29.6*  MCV 85.2 84.1 83.1 83.7 88.1  PLT 175 144* 137* 127* 107*    Cardiac Enzymes: No results for input(s): CKTOTAL, CKMB, CKMBINDEX, TROPONINI in the last 168 hours.  BNP: Invalid input(s): POCBNP  CBG: Recent Labs  Lab 07/27/21 1846 07/27/21 2132 07/28/21 1657 07/28/21 2043 07/29/21 0748  GLUCAP 89 89 92 85 120*    Microbiology: Results for orders placed or performed during the hospital encounter of 07/20/21  Resp Panel by RT-PCR (Flu A&B, Covid) Nasopharyngeal Swab     Status: None   Collection Time: 07/20/21  6:14 PM   Specimen: Nasopharyngeal Swab; Nasopharyngeal(NP) swabs in vial transport medium  Result Value Ref Range Status   SARS Coronavirus 2 by RT PCR NEGATIVE NEGATIVE Final    Comment: (NOTE) SARS-CoV-2 target nucleic acids are NOT DETECTED.  The SARS-CoV-2 RNA is generally  detectable in upper respiratory specimens during the acute phase of infection. The lowest concentration of SARS-CoV-2 viral copies this assay can detect is 138 copies/mL. A negative result does not preclude SARS-Cov-2 infection and should not be used as the sole basis for treatment or other patient management decisions. A negative result may occur with  improper specimen collection/handling, submission of specimen other than nasopharyngeal swab, presence of viral mutation(s) within the areas targeted by this assay, and inadequate number of viral copies(<138 copies/mL). A negative result must be combined with clinical observations, patient history, and epidemiological information. The expected result is Negative.  Fact Sheet for Patients:  EntrepreneurPulse.com.au  Fact Sheet for Healthcare Providers:  IncredibleEmployment.be  This test is no t yet approved or cleared by the [place] FDA and  has been authorized for detection and/or diagnosis of SARS-CoV-2 by FDA under an Emergency Use Authorization (EUA). This EUA will remain  in effect (meaning this test can be used) for the duration of the COVID-19 declaration under Section 564(b)(1) of the Act, 21 U.S.C.section 360bbb-3(b)(1), unless the authorization is terminated  or revoked sooner.       Influenza A by PCR NEGATIVE NEGATIVE Final   Influenza B by PCR NEGATIVE NEGATIVE Final    Comment: (NOTE) The Xpert Xpress SARS-CoV-2/FLU/RSV plus assay is intended as an aid in the diagnosis of influenza from Nasopharyngeal swab specimens and should not be used as a sole basis for treatment. Nasal washings and aspirates are unacceptable for Xpert Xpress SARS-CoV-2/FLU/RSV testing.  Fact Sheet for Patients: EntrepreneurPulse.com.au  Fact Sheet for Healthcare Providers: IncredibleEmployment.be  This test is not yet approved or cleared by the [place] FDA  and has been authorized for detection and/or diagnosis of SARS-CoV-2 by FDA under an Emergency Use Authorization (EUA). This EUA will remain in effect (meaning this test can be used) for the duration of the COVID-19 declaration under Section 564(b)(1) of the Act, 21 U.S.C. section 360bbb-3(b)(1), unless the authorization is terminated or revoked.  Performed at [facility], [address] [place], [place] [address]     Coagulation Studies: No results for input(s): LABPROT, INR in the last 72 hours.  Urinalysis: No results for input(s): COLORURINE, LABSPEC, PHURINE, GLUCOSEU, HGBUR, BILIRUBINUR, KETONESUR, PROTEINUR, UROBILINOGEN, NITRITE, LEUKOCYTESUR in the last 72 hours.  Invalid input(s): APPERANCEUR    Imaging: DG Chest 1 View  Result Date: 07/28/2021 CLINICAL DATA:  Acute renal tubular necrosis. EXAM: CHEST  1 VIEW  COMPARISON:  07/20/2021 FINDINGS: Stable enlarged cardiac silhouette. Increased patchy and linear density in both lungs. Small bilateral pleural effusions, increased on the right and new on the left. Interval distal right clavicle fracture with inferior and proximal displacement of the distal fragment. IMPRESSION: 1. Interval acute distal right clavicle fracture. 2. Progressive bilateral alveolar edema or pneumonia and linear atelectasis. 3. Small bilateral pleural effusions, increased on the right and new on the left. 4. Stable cardiomegaly. Electronically Signed   By: Claudie Revering M.D.   On: 07/28/2021 17:04     Medications:    sodium chloride 0 mL (07/26/21 0928)   sodium chloride Stopped (07/27/21 1630)   albumin human     chlorothiazide (DIURIL) IV     furosemide (LASIX) 200 mg in dextrose 5% 100 mL (2mg /mL) infusion 10 mg/hr (07/28/21 1657)    Chlorhexidine Gluconate Cloth  6 each Topical Daily   folic acid  1 mg Oral Daily   influenza vac split quadrivalent PF  0.5 mL Intramuscular Tomorrow-1000   insulin aspart  0-6 Units Subcutaneous TID WC    liver oil-zinc oxide   Topical See admin instructions   midodrine  5 mg Oral BID WC   pantoprazole (PROTONIX) IV  40 mg Intravenous Q12H   rifaximin  550 mg Oral BID   sodium chloride flush  10-40 mL Intracatheter Q12H   tiotropium  1 capsule Inhalation Daily   acetaminophen, alum & mag hydroxide-simeth, diphenhydrAMINE, lactulose, nitroGLYCERIN, ondansetron (ZOFRAN) IV, sodium chloride flush, traMADol, traZODone      Assessment/ Plan:  Ms. ANGELE WIEMANN is a 55 y.o.  female with a past medical history of hypertension, Type 2 diabetes, atrial fib, ischemic myopathy and chronic kidney disease. She has been admitted with Acute pulmonary edema (HCC) [J81.0] Elevated troponin [R77.8] Acute renal failure (ARF) (HCC) [N17.9] Acute kidney injury ([place]) [N17.9] Acute on chronic systolic (congestive) heart failure (HCC) [I50.23] Closed fracture of one rib of right side with routine healing, subsequent encounter [S22.31XD]       1)Renal   Acute kidney injury Patient has AKI secondary to hepatorenal syndrome/cardiorenal syndrome Patient has AKI on CKD Patient has CKD stage IIIb/4. Patient has CKD secondary to diabetes mellitus Patient has CKD since 2018 Patient CKD has been marked with multiple episodes of AKI. Patient has AKI in 2019 with a peak creatinine of 8.1 Patient had another episode of AKI in 2021 with a peak creatinine of 2.8 Patient is now admitted with another episode of AKI Patient creatinine is stable at around 3.9--4.2   Patient had pulled out her IV line.  Patient did not receive her diuretics as planned.   I requested patient to please allow [place] to give her her medications I discussed with the patient about her chest x-ray showing worsening edema and GFR stable at 12 mL/min.   I discussed with patient about possible need for renal replacement therapy  2)Hypotension   Blood pressure is stable    3)Anemia of chronic disease and GI bleed  CBC Latest Ref Rng &  Units 07/28/2021 07/25/2021 07/24/2021  WBC 4.0 - 10.5 K/uL 8.2 9.5 9.3  Hemoglobin 12.0 - 15.0 g/dL 9.3(L) 10.1(L) 10.0(L)  Hematocrit 36.0 - 46.0 % 29.6(L) 30.9(L) 31.0(L)  Platelets 150 - 400 K/uL 107(L) 127(L) 137(L)       HGb at goal (9--11)   4) Secondary hyperparathyroidism -CKD Mineral-Bone Disorder    Lab Results  Component Value Date   CALCIUM 8.3 (L) 07/29/2021   CAION  1.12 (L) 03/27/2018   PHOS 8.5 (H) 07/29/2021    Secondary Hyperparathyroidism present   Phosphorus is not at goal.   5)GI bleed GI team and primary team is following  6) Electrolytes   BMP Latest Ref Rng & Units 07/29/2021 07/28/2021 07/27/2021  Glucose 70 - 99 mg/dL 120(H) 77 104(H)  BUN 6 - 20 mg/dL 76(H) 76(H) 71(H)  Creatinine 0.44 - 1.00 mg/dL 4.14(H) 4.00(H) 3.97(H)  BUN/Creat Ratio 9 - 23 - - -  Sodium 135 - 145 mmol/L 135 134(L) 131(L)  Potassium 3.5 - 5.1 mmol/L 4.7 4.2 4.3  Chloride 98 - 111 mmol/L 101 102 100  CO2 22 - 32 mmol/L 23 22 22   Calcium 8.9 - 10.3 mg/dL 8.3(L) 7.7(L) 7.7(L)     Sodium Hyponatremia Patient has hypervolemic hyponatremia Patient sodium is improving   Potassium Normokalemic    7)Acid base  Co2 at goal   9)Acute on chronic systolic CHF Patient is currently in fluid overload/anasarca I did increase patient's Lasix dose from 6 mg/h to 10 mg/h Patient remains overtly positive Patient chest x-ray shows patient having worsening pulmonary congestion and increasing pleural effusion On examination patient continues to have anasarca . We will add thiazides in an effort to increase diuresis I did discuss the patient about possible need for renal placement therapy   10) Clavicle fracture I asked for chest x-ray yesterday to review patient's fluid status Patient chest x-ray came back positive for clavicular fracture Patient did have a fall, patient primary team is aware and is following    Plan   We will add thiazides to help with diuresis  will  observe for another 24 hours if patient remains nonresponsive to the increased dose of Lasix will discuss again with the patient about need for replacement therapy   LOS: 9 Daquana Paddock s [facility] 9/11/202210:00 AM
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
## 2021-07-30 ENCOUNTER — Encounter: Payer: Self-pay | Admitting: Gastroenterology

## 2021-07-30 DIAGNOSIS — N170 Acute kidney failure with tubular necrosis: Secondary | ICD-10-CM

## 2021-07-30 DIAGNOSIS — Z515 Encounter for palliative care: Secondary | ICD-10-CM

## 2021-07-30 LAB — RENAL FUNCTION PANEL
Albumin: 4.1 g/dL (ref 3.5–5.0)
Anion gap: 14 (ref 5–15)
BUN: 77 mg/dL — ABNORMAL HIGH (ref 6–20)
CO2: 19 mmol/L — ABNORMAL LOW (ref 22–32)
Calcium: 7.9 mg/dL — ABNORMAL LOW (ref 8.9–10.3)
Chloride: 99 mmol/L (ref 98–111)
Creatinine, Ser: 4.52 mg/dL — ABNORMAL HIGH (ref 0.44–1.00)
GFR, Estimated: 11 mL/min — ABNORMAL LOW (ref >60)
Glucose, Bld: 80 mg/dL (ref 70–99)
Phosphorus: 7.7 mg/dL — ABNORMAL HIGH (ref 2.5–4.6)
Potassium: 4.8 mmol/L (ref 3.5–5.1)
Sodium: 132 mmol/L — ABNORMAL LOW (ref 135–145)

## 2021-07-30 LAB — GLUCOSE, CAPILLARY
Glucose-Capillary: 113 mg/dL — ABNORMAL HIGH (ref 70–99)
Glucose-Capillary: 68 mg/dL — ABNORMAL LOW (ref 70–99)
Glucose-Capillary: 68 mg/dL — ABNORMAL LOW (ref 70–99)
Glucose-Capillary: 84 mg/dL (ref 70–99)

## 2021-07-30 LAB — SURGICAL PATHOLOGY

## 2021-07-30 MED ORDER — SODIUM CHLORIDE 0.9% FLUSH
3.0000 mL | Freq: Two times a day (BID) | INTRAVENOUS | Status: DC
  Administered 2021-07-30: 3 mL via INTRAVENOUS

## 2021-07-30 MED ORDER — LORAZEPAM 2 MG/ML IJ SOLN: 1.0000 mg | INTRAMUSCULAR | Status: DC | PRN

## 2021-07-30 MED ORDER — ACETAMINOPHEN 325 MG PO TABS: 650.0000 mg | ORAL_TABLET | Freq: Four times a day (QID) | ORAL | Status: DC | PRN

## 2021-07-30 MED ORDER — SODIUM CHLORIDE 0.9 % IV BOLUS
250.0000 mL | Freq: Once | INTRAVENOUS | Status: AC
  Administered 2021-07-30: 250 mL via INTRAVENOUS

## 2021-07-30 MED ORDER — GLYCOPYRROLATE 0.2 MG/ML IJ SOLN
0.2000 mg | INTRAMUSCULAR | Status: DC | PRN
  Filled 2021-07-30: qty 1

## 2021-07-30 MED ORDER — LACTATED RINGERS IV BOLUS: 500.0000 mL | Freq: Once | INTRAVENOUS | Status: DC

## 2021-07-30 MED ORDER — LORAZEPAM 2 MG/ML PO CONC
1.0000 mg | ORAL | Status: DC | PRN
  Filled 2021-07-30: qty 0.5

## 2021-07-30 MED ORDER — SODIUM CHLORIDE 0.9 % IV SOLN: 250.0000 mL | INTRAVENOUS | Status: DC | PRN

## 2021-07-30 MED ORDER — BIOTENE DRY MOUTH MT LIQD: 15.0000 mL | OROMUCOSAL | Status: DC | PRN

## 2021-07-30 MED ORDER — NICOTINE 21 MG/24HR TD PT24
21.0000 mg | MEDICATED_PATCH | Freq: Every day | TRANSDERMAL | Status: DC
  Administered 2021-07-30: 21 mg via TRANSDERMAL
  Filled 2021-07-30: qty 1

## 2021-07-30 MED ORDER — PHENOL 1.4 % MT LIQD
1.0000 | OROMUCOSAL | Status: DC | PRN
  Administered 2021-07-30: 1 via OROMUCOSAL
  Filled 2021-07-30 (×2): qty 177

## 2021-07-30 MED ORDER — SODIUM CHLORIDE 0.9 % IV SOLN
12.5000 mg | Freq: Four times a day (QID) | INTRAVENOUS | Status: DC | PRN
  Filled 2021-07-30 (×2): qty 0.5

## 2021-07-30 MED ORDER — SCOPOLAMINE 1 MG/3DAYS TD PT72
1.0000 | MEDICATED_PATCH | TRANSDERMAL | Status: DC
  Administered 2021-07-30: 1.5 mg via TRANSDERMAL
  Filled 2021-07-30: qty 1

## 2021-07-30 MED ORDER — MIDODRINE HCL 5 MG PO TABS
10.0000 mg | ORAL_TABLET | Freq: Two times a day (BID) | ORAL | Status: DC
  Administered 2021-07-30: 10 mg via ORAL
  Filled 2021-07-30: qty 2

## 2021-07-30 MED ORDER — DIGOXIN 0.25 MG/ML IJ SOLN
0.2500 mg | Freq: Once | INTRAMUSCULAR | Status: AC
  Administered 2021-07-30: 0.25 mg via INTRAVENOUS
  Filled 2021-07-30: qty 2

## 2021-07-30 MED ORDER — SODIUM CHLORIDE 0.9% FLUSH: 3.0000 mL | INTRAVENOUS | Status: DC | PRN

## 2021-07-30 MED ORDER — HYDROMORPHONE BOLUS VIA INFUSION
0.5000 mg | INTRAVENOUS | Status: AC
  Administered 2021-07-30: 0.5 mg via INTRAVENOUS
  Filled 2021-07-30: qty 1

## 2021-07-30 MED ORDER — LORAZEPAM 1 MG PO TABS: 1.0000 mg | ORAL_TABLET | ORAL | Status: DC | PRN

## 2021-07-30 MED ORDER — POLYVINYL ALCOHOL 1.4 % OP SOLN
1.0000 [drp] | Freq: Four times a day (QID) | OPHTHALMIC | Status: DC | PRN
  Filled 2021-07-30: qty 15

## 2021-07-30 MED ORDER — SODIUM CHLORIDE 0.9 % IV SOLN
0.5000 mg/h | INTRAVENOUS | Status: DC
  Administered 2021-07-30: 0.5 mg/h via INTRAVENOUS
  Filled 2021-07-30: qty 5

## 2021-07-30 MED ORDER — ACETAMINOPHEN 650 MG RE SUPP: 650.0000 mg | Freq: Four times a day (QID) | RECTAL | Status: DC | PRN

## 2021-07-30 MED ORDER — GLYCOPYRROLATE 1 MG PO TABS
1.0000 mg | ORAL_TABLET | ORAL | Status: DC | PRN
  Filled 2021-07-30: qty 1

## 2021-07-30 MED ORDER — HYDROMORPHONE BOLUS VIA INFUSION
0.5000 mg | INTRAVENOUS | Status: DC | PRN
  Filled 2021-07-30: qty 1

## 2021-07-30 NOTE — Care Management Important Message (Signed)
Important Message  Patient Details  Name: [name] MRN: [id number] Date of Birth: August 26, 1966   Medicare Important Message Given:  Other (see comment)  Transitioning to hospice home.  Medicare IM withheld at this time out of respect for patient and family.    Dannette Barbara 07/30/2021, 2:32 PM
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
## 2021-07-30 NOTE — Progress Notes (Signed)
Patient is now comfort measures only. She is being discharged to hospice house and report has been given to them. She is on a dilaudid drip and has been given zofran for nausea. She has vomited several times today. Sister and husband at bedside. Transport here to get patient to go to hospice house.

## 2021-07-30 NOTE — TOC Transition Note (Addendum)
Transition of Care [facility]) - CM/SW Discharge Note   Patient Details  Name: [name] MRN: [id number] Date of Birth: 25-Jan-1966  [facility] CM/SW Contact:  Alberteen Sam, LCSW Phone Number: 07/30/2021, 1:51 PM   Clinical Narrative:     Patient will DC to: [place] Anticipated DC date: 07/30/21 Transport by: [facility]  Per MD patient ready for DC to [facility] . RN, patient, patient's family, and facility notified of DC. Discharge Summary sent to facility. RN given number for report to [place] at [phone] . DC packet on chart. Ambulance transport requested for patient through [facility] for 4pm pick up.  CSW signing off.  Pricilla Riffle, LCSW    Final next level of care: [place] Barriers to Discharge: No Barriers Identified   Patient Goals and CMS Choice Patient states their goals for this hospitalization and ongoing recovery are:: to go to hospice home CMS Medicare.gov Compare Post Acute Care list provided to:: Patient Represenative (must comment) (family) Choice offered to / list presented to : [facility] POA / [place]  Discharge Placement              Patient chooses bed at:  ([place] home) Patient to be transferred to facility by: [facility] Name of family member notified: family notified via [facility] Patient and family notified of of transfer: 07/30/21  Discharge Plan and Services                                     Social Determinants of Health ([place]) Interventions     Readmission Risk Interventions Readmission Risk Prevention Plan 04/02/2021 03/15/2021 12/02/2020  Transportation Screening Complete Complete Complete  PCP or Specialist Appt within 5-7 Days - - -  PCP or Specialist Appt within 3-5 Days - - Complete  Home Care Screening - - -  Medication Review (RN CM) - - -  HRI or Home Care Consult - - Complete  Social Work Consult for [place]  Planning/Counseling - - Complete  Palliative Care Screening - - Not Applicable  Medication Review Press photographer) Complete Complete Complete  PCP or Specialist appointment within 3-5 days of discharge Complete - -  PCP/Specialist Appt Not Complete comments Appointment is 7 days out due to availability. - -  HRI or Home Care Consult Complete Complete -  SW Recovery Care/Counseling Consult Complete Complete -  Palliative Care Screening Complete Not Applicable -  [place] Not Applicable Not Applicable -  Some recent data might be hidden

## 2021-07-30 NOTE — Discharge Summary (Signed)
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 may not always be located. Such creation errors do not reflect on the standard of care.

## 2021-07-30 NOTE — Progress Notes (Signed)
[name] Hospital Liaison Note   Received request from Transitions of Care Manager Pricilla Riffle, LCSW for family interest in [name]. Visited patient at bedside and spoke with husband, [name] and sister, [name] to confirm interest and explain services. Patient chart and information reviewed by [facility] physician. Hospice Home eligibility confirmed.    Family agreeable to transfer today. TOC Manager aware.   RN please call report to [facility] at [phone] prior to patient leaving the unit.  Please send signed and completed DNR with patient at discharge.   Please do not hesitate to call with any hospice related questions.    Thank you for the opportunity to participate in this patient's care.   Bobbie "Loren Racer, RN, [name] [facility] Liaison [phone]

## 2021-07-30 NOTE — Progress Notes (Signed)
[facility]  ROUNDING NOTE   Subjective:   Family member at bedside.   Patient states she is anxious and not breathing well. More awake this morning  Creatinine 4.52 (4.14) Sodium 132 (135)  UOP 250 recorded  Furosemide gtt    Objective:  Vital signs in last 24 hours:  Temp:  [97.5 F (36.4 C)-98 F (36.7 C)] 97.6 F (36.4 C) (09/12 0943) Pulse Rate:  [82-133] 131 (09/12 0943) Resp:  [17-20] 20 (09/12 0943) BP: (69-117)/(50-83) 86/65 (09/12 0943) SpO2:  [85 %-100 %] 97 % (09/12 0943) Weight:  [100.6 kg] 100.6 kg (09/12 0603)  Weight change: -0.824 kg Filed Weights   07/28/21 0311 07/28/21 2357 07/30/21 0603  Weight: 97 kg 101.4 kg 100.6 kg    Intake/Output: I/O last 3 completed shifts: In: 8 [P.O.:620; IV Piggyback:200] Out: 450 [Urine:450]   Intake/Output this shift:  Total I/O In: 461 [P.O.:360; IV Piggyback:101] Out: 200 [Urine:200]  Physical Exam: General: NAD, laying in bed  Head: Normocephalic, atraumatic. Moist oral mucosal membranes  Eyes: Anicteric, legally blind  Lungs:  Diminished bilaterally, [place] O2  Heart: Regular rate and rhythm  Abdomen:  firm, nontender, distended,abdominal wall edema present   Extremities:  2+ peripheral edema. Bilateral AKA  Neurologic: Nonfocal, moving all four extremities, Answering questions appropriately.   Skin: No lesions       Basic Metabolic Panel: Recent Labs  Lab  0000 07/23/21 2133 07/24/21 0419 07/25/21 0507 07/26/21 7829 07/27/21 5621 07/28/21 0439 07/29/21 0528 07/30/21 0528  NA  --   --  135   < > 130* 131* 134* 135 132*  K  --   --  4.2   < > 4.1 4.3 4.2 4.7 4.8  CL  --   --  103   < > 97* 100 102 101 99  CO2  --   --  23   < > 21* 22 22 23  19*  GLUCOSE  --   --  80   < > 129* 104* 77 120* 80  BUN  --   --  63*   < > 67* 71* 76* 76* 77*  CREATININE  --   --  3.86*   < > 4.24* 3.97* 4.00* 4.14* 4.52*  CALCIUM  --   --  7.7*   < > 7.6* 7.7* 7.7* 8.3* 7.9*  MG  --  1.7 2.1  --    --   --   --   --   --   PHOS   < >  --  8.3*  --  7.7* 7.6* 7.8* 8.5* 7.7*   < > = values in this interval not displayed.     Liver Function Tests: Recent Labs  Lab 07/26/21 0859 07/27/21 0635 07/28/21 0439 07/29/21 0528 07/30/21 0528  ALBUMIN 3.0* 3.4* 3.7 4.2 4.1    No results for input(s): LIPASE, AMYLASE in the last 168 hours. Recent Labs  Lab 07/25/21 0648  AMMONIA 28     CBC: Recent Labs  Lab 07/23/21 2133 07/24/21 0419 07/25/21 0507 07/28/21 0439  WBC 10.1 9.3 9.5 8.2  NEUTROABS  --   --  8.2*  --   HGB 10.5* 10.0* 10.1* 9.3*  HCT 32.2* 31.0* 30.9* 29.6*  MCV 84.1 83.1 83.7 88.1  PLT 144* 137* 127* 107*     Cardiac Enzymes: No results for input(s): CKTOTAL, CKMB, CKMBINDEX, TROPONINI in the last 168 hours.  BNP: Invalid input(s): POCBNP  CBG: Recent Labs  Lab 07/28/21  2043 07/29/21 0748 07/29/21 1158 07/29/21 2141 07/30/21 0850  GLUCAP 85 120* 109* 94 84     Microbiology: Results for orders placed or performed during the hospital encounter of 07/20/21  Resp Panel by RT-PCR (Flu A&B, Covid) Nasopharyngeal Swab     Status: None   Collection Time: 07/20/21  6:14 PM   Specimen: Nasopharyngeal Swab; Nasopharyngeal(NP) swabs in vial transport medium  Result Value Ref Range Status   SARS Coronavirus 2 by RT PCR NEGATIVE NEGATIVE Final    Comment: (NOTE) SARS-CoV-2 target nucleic acids are NOT DETECTED.  The SARS-CoV-2 RNA is generally detectable in upper respiratory specimens during the acute phase of infection. The lowest concentration of SARS-CoV-2 viral copies this assay can detect is 138 copies/mL. A negative result does not preclude SARS-Cov-2 infection and should not be used as the sole basis for treatment or other patient management decisions. A negative result may occur with  improper specimen collection/handling, submission of specimen other than nasopharyngeal swab, presence of viral mutation(s) within the areas targeted by this  assay, and inadequate number of viral copies(<138 copies/mL). A negative result must be combined with clinical observations, patient history, and epidemiological information. The expected result is Negative.  Fact Sheet for Patients:  EntrepreneurPulse.com.au  Fact Sheet for Healthcare Providers:  IncredibleEmployment.be  This test is no t yet approved or cleared by the [place] FDA and  has been authorized for detection and/or diagnosis of SARS-CoV-2 by FDA under an Emergency Use Authorization (EUA). This EUA will remain  in effect (meaning this test can be used) for the duration of the COVID-19 declaration under Section 564(b)(1) of the Act, 21 U.S.C.section 360bbb-3(b)(1), unless the authorization is terminated  or revoked sooner.       Influenza A by PCR NEGATIVE NEGATIVE Final   Influenza B by PCR NEGATIVE NEGATIVE Final    Comment: (NOTE) The Xpert Xpress SARS-CoV-2/FLU/RSV plus assay is intended as an aid in the diagnosis of influenza from Nasopharyngeal swab specimens and should not be used as a sole basis for treatment. Nasal washings and aspirates are unacceptable for Xpert Xpress SARS-CoV-2/FLU/RSV testing.  Fact Sheet for Patients: EntrepreneurPulse.com.au  Fact Sheet for Healthcare Providers: IncredibleEmployment.be  This test is not yet approved or cleared by the [place] FDA and has been authorized for detection and/or diagnosis of SARS-CoV-2 by FDA under an Emergency Use Authorization (EUA). This EUA will remain in effect (meaning this test can be used) for the duration of the COVID-19 declaration under Section 564(b)(1) of the Act, 21 U.S.C. section 360bbb-3(b)(1), unless the authorization is terminated or revoked.  Performed at [facility], [address] [place], [place] [address]     Coagulation Studies: No results for input(s): LABPROT, INR in the last 72  hours.  Urinalysis: No results for input(s): COLORURINE, LABSPEC, PHURINE, GLUCOSEU, HGBUR, BILIRUBINUR, KETONESUR, PROTEINUR, UROBILINOGEN, NITRITE, LEUKOCYTESUR in the last 72 hours.  Invalid input(s): APPERANCEUR    Imaging: DG Chest 1 View  Result Date: 07/28/2021 CLINICAL DATA:  Acute renal tubular necrosis. EXAM: CHEST  1 VIEW COMPARISON:  07/20/2021 FINDINGS: Stable enlarged cardiac silhouette. Increased patchy and linear density in both lungs. Small bilateral pleural effusions, increased on the right and new on the left. Interval distal right clavicle fracture with inferior and proximal displacement of the distal fragment. IMPRESSION: 1. Interval acute distal right clavicle fracture. 2. Progressive bilateral alveolar edema or pneumonia and linear atelectasis. 3. Small bilateral pleural effusions, increased on the right and new on the left.  4. Stable cardiomegaly. Electronically Signed   By: Claudie Revering M.D.   On: 07/28/2021 17:04     Medications:    sodium chloride 0 mL (07/26/21 0928)   sodium chloride Stopped (07/27/21 1630)   albumin human 25 g (07/30/21 0932)   furosemide (LASIX) 200 mg in dextrose 5% 100 mL (2mg /mL) infusion 10 mg/hr (07/28/21 1657)    Chlorhexidine Gluconate Cloth  6 each Topical Daily   folic acid  1 mg Oral Daily   influenza vac split quadrivalent PF  0.5 mL Intramuscular Tomorrow-1000   insulin aspart  0-6 Units Subcutaneous TID WC   liver oil-zinc oxide   Topical See admin instructions   midodrine  10 mg Oral BID WC   pantoprazole (PROTONIX) IV  40 mg Intravenous Q12H   rifaximin  550 mg Oral BID   sodium chloride flush  10-40 mL Intracatheter Q12H   tiotropium  1 capsule Inhalation Daily   acetaminophen, alum & mag hydroxide-simeth, diphenhydrAMINE, lactulose, nitroGLYCERIN, ondansetron (ZOFRAN) IV, sodium chloride flush, traMADol, traZODone      Assessment/ Plan:  Ms. [name] is a 55 y.o. white female with hypertension, diabetes  mellitus type II, atrial fibrillation, congestive heart failure who is admitted to on 07/20/2021 for Acute pulmonary edema ([place]) [J81.0] Elevated troponin [R77.8] Acute renal failure (ARF) ([place]) [N17.9] Acute kidney injury ([place]) [N17.9] Acute on chronic systolic (congestive) heart failure (HCC) [I50.23] Closed fracture of one rib of right side with routine healing, subsequent encounter [S22.31XD]  Acute kidney injury on chronic kidney disease stage IIIB with proteinuria. Baseline creatinine of 1.53, GFR of 40 on 04/05/21. CKD secondary to diabetic nephropathy and hypertensive nephrosclerosis. Acute kidney injury secondary to acute cardiorenal syndrome.  - Low threshold to start hemodialysis.   Hypertension and acute exacerbation of systolic and diastolic congestive heart failure.  - Continue furosemide gtt - Holding metoprolol - Continue midodrine.  - Discontinue IV albumin  Anemia with chronic kidney disease: normocytic. Iron stores at goal on 9/5. Hemoglobin trending at 9.3 - may need ESA  Hyponatremia: with hypervolumia. Secondary to acute exacerbation of congestive heart failure - Continue Loop diuretics: furosemide gtt  Diabetes mellitus type II with chronic kidney disease: insulin dependent. Hemoglobin A1c of 5.7%.     LOS: 10 Muhammadali Ries 9/12/202210:04 AM

## 2021-07-30 NOTE — Progress Notes (Addendum)
Daily Progress Note   Patient Name: [name]       Date: 07/30/2021 DOB: 06/22/1966  Age: 55 y.o. MRN#: [id number] Attending Physician: Jamie Nimrod, MD Primary Care Physician: Jamie Richards, MD Admit Date: 07/20/2021  Reason for Consultation/Follow-up: Establishing goals of care  Subjective: Message from attending MD that patient and family may be ready for hospice. In to speak with patient and her sister [name]. Patient states she is tired. She is complaining of significant generalized pain. She states she is ready to stop life prolonging care and be comfortable. [name] is in agreement with this plan. Discussed plans comfort care and for hospice facility in detail as patient does not want to go home with husband, nor does [place]. Dicussed her frustrations with husband and concerns for discharge and how he may handle the information. Discussed the purpose of HPOA documents and reassured her husband would not be surrogate decision maker.   I completed a MOST form today with patient and with [name], signed by [name] as patient is legally blind. The signed original was placed in the chart. A photocopy was placed in the chart to be scanned into EMR. The patient outlined their wishes for the following treatment decisions:  Cardiopulmonary Resuscitation: Do Not Attempt Resuscitation (DNR/No CPR)  Medical Interventions: Comfort Measures: Keep clean, warm, and dry. Use medication by any route, positioning, wound care, and other measures to relieve pain and suffering. Use oxygen, suction and manual treatment of airway obstruction as needed for comfort. Do not transfer to the hospital unless comfort needs cannot be met in current location.  Antibiotics: No antibiotics (use other measures to relieve  symptoms)  IV Fluids: No IV fluids (provide other measures to ensure comfort)  Feeding Tube: No feeding tube    Husband arrived to bedside. In to speak with patient, [name], and husband. He requests to discuss status and plans. Patient and [name] would like this. Status and plans discussed. He is amenable to plans for hospice facility placement and comfort care.   ADDENDUM: Scopolamine patch and PRN Phenergan added for nausea and vomiting.  Length of Stay: 10  Current Medications: Scheduled Meds:   Chlorhexidine Gluconate Cloth  6 each Topical Daily   folic acid  1 mg Oral Daily   influenza vac split quadrivalent PF  0.5 mL  Intramuscular Tomorrow-1000   insulin aspart  0-6 Units Subcutaneous TID WC   liver oil-zinc oxide   Topical See admin instructions   midodrine  10 mg Oral BID WC   pantoprazole (PROTONIX) IV  40 mg Intravenous Q12H   rifaximin  550 mg Oral BID   sodium chloride flush  10-40 mL Intracatheter Q12H   tiotropium  1 capsule Inhalation Daily    Continuous Infusions:  sodium chloride 0 mL (07/26/21 0928)   sodium chloride Stopped (07/27/21 1630)   albumin human 25 g (07/30/21 0932)   furosemide (LASIX) 200 mg in dextrose 5% 100 mL (22m/mL) infusion 10 mg/hr (07/28/21 1657)    PRN Meds: acetaminophen, alum & mag hydroxide-simeth, diphenhydrAMINE, lactulose, nitroGLYCERIN, ondansetron (ZOFRAN) IV, sodium chloride flush, traMADol, traZODone  Physical Exam Pulmonary:     Effort: Pulmonary effort is normal.  Neurological:     Mental Status: She is alert.            Vital Signs: BP (!) 86/65 (BP Location: Right Arm)   Pulse (!) 131   Temp 97.6 F (36.4 C)   Resp 20   Ht '5\' 7"'  (1.702 m)   Wt 100.6 kg   SpO2 97%   BMI 34.74 kg/m  SpO2: SpO2: 97 % O2 Device: O2 Device: Nasal Cannula O2 Flow Rate: O2 Flow Rate (L/min): 3 L/min  Intake/output summary:  Intake/Output Summary (Last 24 hours) at 07/30/2021 1042 Last data filed at 07/30/2021 1000 Gross per 24  hour  Intake 1041 ml  Output 450 ml  Net 591 ml   LBM: Last BM Date: 07/26/21 Baseline Weight: Weight: 74.8 kg Most recent weight: Weight: 100.6 kg    Patient Active Problem List   Diagnosis Date Noted   Acute pulmonary edema (HCC)    Closed fracture of rib of right side with routine healing    Pressure injury of skin 07/22/2021   Acute on chronic systolic (congestive) heart failure (HCC) 07/20/2021   Elevated troponin 07/20/2021   Acute urinary retention 03/30/2021   CKD (chronic kidney disease) stage 3A, GFR 30-59 ml/min  03/30/2021   COVID-19 virus infection 03/29/2021   Hypokalemia due to excessive gastrointestinal loss of potassium 03/29/2021   Hypomagnesemia 03/29/2021   Diarrhea    Non-intractable vomiting    Left lower quadrant abdominal pain    Hypokalemia 03/09/2021   Electrolyte abnormality 03/08/2021   S/P bilateral above knee amputation ([place] 03/08/2021   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 ([place] 12/05/2020   CKD stage 4 due to type 2 diabetes mellitus ([place] 12/05/2020   Hyperlipidemia associated with type 2 diabetes mellitus ([place] 12/05/2020   Controlled type 2 diabetes mellitus with stage 4 chronic kidney disease, with long-term current use of insulin ([place] 12/05/2020   S/P BKA (below knee amputation) unilateral, right ([place] 12/05/2020   Anemia due to stage 4 chronic kidney disease ([place] 12/05/2020   Leukopenia 11/29/2020   Elevated brain natriuretic peptide (BNP) level 11/29/2020   Elevated d-dimer 11/29/2020   Prolonged QT interval 11/29/2020   Stable treated proliferative diabetic retinopathy of left eye determined by examination associated with type 2 diabetes mellitus ([place] 08/14/2020   Macular pucker, left eye 02/24/2020   Follow-up examination after eye surgery 02/24/2020   Coronary artery disease involving native coronary artery of native heart without angina pectoris 11/21/2018   Ischemic cardiomyopathy 11/21/2018    Essential hypertension 11/21/2018   Labile blood pressure    Diabetic retinopathy associated with  type 2 diabetes mellitus (HCC)    Hypoalbuminemia due to protein-calorie malnutrition (HCC)    Type 2 diabetes mellitus with peripheral neuropathy (HCC)    Chronic combined systolic (congestive) and diastolic (congestive) heart failure (HCC)    Unilateral AKA, left (HCC)    Atrial fibrillation with rapid ventricular response (HCC)    Diabetic peripheral neuropathy (HCC)    Anemia of chronic disease    Gastroesophageal reflux disease    Non-ST elevation (NSTEMI) myocardial infarction [facility])    Acute combined systolic and diastolic heart failure (HCC)    Acute respiratory failure with hypoxia (HCC)    Microcytic anemia 04/25/2018   Pulmonary nodule 04/25/2018   Gangrene of lower extremity ([place]) 04/25/2018   Femoral-popliteal bypass graft occlusion, 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   Respiratory failure with hypoxia ([place]) 12/02/2017   Leukocytosis 12/02/2017   Paroxysmal atrial fibrillation with rapid ventricular response ([place]) 12/02/2017   Hyperkalemia 06/26/2017   Elevated lactic acid level    Acute kidney injury ([place]) 12/23/2016   Chronic back pain 11/27/2016   Hyperlipidemia LDL goal <70    Retinal tumor of right eye    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     Code Status:    Code Status Orders  (From admission, onward)           Start     Ordered   07/27/21 0824  Do not attempt  resuscitation (DNR)  Continuous       Question Answer Comment  In the event of cardiac or respiratory ARREST Do not call a "code blue"   In the event of cardiac or respiratory ARREST Do not perform Intubation, CPR, defibrillation or ACLS   In the event of cardiac or respiratory ARREST Use medication by any route, position, wound care, and other measures to relive pain and suffering. May use oxygen, suction and manual treatment of airway obstruction as needed for comfort.   Comments MOST form in chart      07/27/21 0823           Code Status History     Date Active Date Inactive Code Status Order ID Comments User Context   07/20/2021 2247 07/27/2021 0823 Full Code [id number]  Clarnce Flock, MD ED   03/28/2021 2335 04/03/2021 0107 Full Code [id number]  Elgergawy, Silver [place]uenin, MD ED   03/08/2021 1821 03/16/2021 2035 Full Code [id number]  Bethena Roys, MD Inpatient   11/29/2020 1953 12/02/2020 1938 Full Code [id number]  Bernadette Hoit, DO ED   10/06/2020 2256 10/11/2020 2338 Full Code [id number]  Kayleen Memos, DO ED   05/22/2018 1442 06/03/2018 1348 Full Code [id number]  Cathlyn Parsons, PA-C Inpatient   05/22/2018 1442 05/22/2018 1442 Full Code [id number]  Cathlyn Parsons, PA-C Inpatient   04/25/2018 0040 05/22/2018 1439 Full Code [id number]  Omar Person, NP ED   03/27/2018 1710 03/31/2018 2006 Full Code [id number]  Ulyses Amor, PA-C Inpatient   03/24/2018 1131 03/24/2018 2044 Full Code [id number]  Serafina Mitchell, MD Inpatient   11/24/2017 1811  12/09/2017 2124 Full Code [id number]  Orbie Hurst Inpatient   10/31/2017 6859 10/31/2017 1628 Full Code [id number]  Elam Dutch, MD Inpatient   01/07/2017 1759 01/08/2017 1831 Full Code [id number]  Serafina Mitchell, MD Inpatient   12/24/2016 0250 01/01/2017 1933 Full Code [id number]  Karmen Bongo, MD Inpatient   11/27/2016 0028 11/28/2016 1839 Full Code [id number]  Etta Quill, DO ED   09/30/2016 2057 10/06/2016 1600 Full Code [id number]   Georganna Skeans, MD Inpatient   05/07/2016 1237 05/07/2016 2010 Full Code [id number]  Serafina Mitchell, MD Inpatient      Advance Directive Documentation    Flowsheet Row Most Recent Value  Type of Advance Directive Healthcare Power of Attorney, Living will  Pre-existing out of facility DNR order (yellow form or pink MOST form) --  "MOST" Form in Place? --       Prognosis:  < 2 weeks CHF with EF of 25-30%. Cirrhosis. Renal failure- creatinine 4.5. minmal PO intake.   Care plan was discussed with primary MD, primary RN, charge RN, TOC, hospice liaison.   Thank you for allowing the Palliative Medicine Team to assist in the care of this patient.   Time In: 9:50 Time Out: 11:00 Total Time 70 min Prolonged Time Billed  yes       Greater than 50%  of this time was spent counseling and coordinating care related to the above assessment and plan.  Asencion Gowda, NP  Please contact Palliative Medicine Team phone at [phone] for questions and concerns.

## 2021-08-08 ENCOUNTER — Ambulatory Visit: Payer: Medicare Other | Admitting: Family

## 2021-08-18 DEATH — deceased
